# Patient Record
Sex: Female | Born: 1950 | Race: White | Hispanic: No | Marital: Married | Smoking: Never smoker
Health system: Southern US, Community
[De-identification: ages and names within clinical notes are randomized; demographics above are authoritative.]

## PROBLEM LIST (undated history)

## (undated) DIAGNOSIS — I2510 Atherosclerotic heart disease of native coronary artery without angina pectoris: Secondary | ICD-10-CM

## (undated) DIAGNOSIS — C649 Malignant neoplasm of unspecified kidney, except renal pelvis: Secondary | ICD-10-CM

## (undated) DIAGNOSIS — R0600 Dyspnea, unspecified: Secondary | ICD-10-CM

## (undated) DIAGNOSIS — R0902 Hypoxemia: Secondary | ICD-10-CM

## (undated) DIAGNOSIS — E785 Hyperlipidemia, unspecified: Secondary | ICD-10-CM

## (undated) DIAGNOSIS — C8190 Hodgkin lymphoma, unspecified, unspecified site: Secondary | ICD-10-CM

## (undated) DIAGNOSIS — M1990 Unspecified osteoarthritis, unspecified site: Secondary | ICD-10-CM

## (undated) DIAGNOSIS — I4891 Unspecified atrial fibrillation: Secondary | ICD-10-CM

## (undated) DIAGNOSIS — N182 Chronic kidney disease, stage 2 (mild): Secondary | ICD-10-CM

## (undated) DIAGNOSIS — I350 Nonrheumatic aortic (valve) stenosis: Secondary | ICD-10-CM

## (undated) DIAGNOSIS — N289 Disorder of kidney and ureter, unspecified: Secondary | ICD-10-CM

## (undated) DIAGNOSIS — Z950 Presence of cardiac pacemaker: Secondary | ICD-10-CM

## (undated) DIAGNOSIS — R55 Syncope and collapse: Secondary | ICD-10-CM

## (undated) DIAGNOSIS — F419 Anxiety disorder, unspecified: Secondary | ICD-10-CM

## (undated) DIAGNOSIS — I10 Essential (primary) hypertension: Secondary | ICD-10-CM

## (undated) DIAGNOSIS — N184 Chronic kidney disease, stage 4 (severe): Secondary | ICD-10-CM

## (undated) DIAGNOSIS — C50919 Malignant neoplasm of unspecified site of unspecified female breast: Secondary | ICD-10-CM

## (undated) DIAGNOSIS — D509 Iron deficiency anemia, unspecified: Secondary | ICD-10-CM

## (undated) DIAGNOSIS — E1143 Type 2 diabetes mellitus with diabetic autonomic (poly)neuropathy: Secondary | ICD-10-CM

## (undated) DIAGNOSIS — Z9889 Other specified postprocedural states: Secondary | ICD-10-CM

## (undated) DIAGNOSIS — K559 Vascular disorder of intestine, unspecified: Secondary | ICD-10-CM

## (undated) DIAGNOSIS — Z9989 Dependence on other enabling machines and devices: Secondary | ICD-10-CM

## (undated) DIAGNOSIS — R112 Nausea with vomiting, unspecified: Secondary | ICD-10-CM

## (undated) DIAGNOSIS — I5032 Chronic diastolic (congestive) heart failure: Secondary | ICD-10-CM

## (undated) DIAGNOSIS — D131 Benign neoplasm of stomach: Secondary | ICD-10-CM

## (undated) DIAGNOSIS — M810 Age-related osteoporosis without current pathological fracture: Secondary | ICD-10-CM

## (undated) DIAGNOSIS — G4733 Obstructive sleep apnea (adult) (pediatric): Secondary | ICD-10-CM

## (undated) DIAGNOSIS — K3184 Gastroparesis: Principal | ICD-10-CM

## (undated) DIAGNOSIS — G43909 Migraine, unspecified, not intractable, without status migrainosus: Secondary | ICD-10-CM

## (undated) DIAGNOSIS — K56609 Unspecified intestinal obstruction, unspecified as to partial versus complete obstruction: Secondary | ICD-10-CM

## (undated) DIAGNOSIS — I519 Heart disease, unspecified: Secondary | ICD-10-CM

## (undated) DIAGNOSIS — Z1379 Encounter for other screening for genetic and chromosomal anomalies: Secondary | ICD-10-CM

## (undated) DIAGNOSIS — K7581 Nonalcoholic steatohepatitis (NASH): Secondary | ICD-10-CM

## (undated) DIAGNOSIS — M8580 Other specified disorders of bone density and structure, unspecified site: Secondary | ICD-10-CM

## (undated) DIAGNOSIS — E109 Type 1 diabetes mellitus without complications: Secondary | ICD-10-CM

## (undated) DIAGNOSIS — I480 Paroxysmal atrial fibrillation: Secondary | ICD-10-CM

## (undated) DIAGNOSIS — T8859XA Other complications of anesthesia, initial encounter: Secondary | ICD-10-CM

## (undated) HISTORY — DX: Encounter for other screening for genetic and chromosomal anomalies: Z13.79

## (undated) HISTORY — DX: Type 2 diabetes mellitus with diabetic autonomic (poly)neuropathy: E11.43

## (undated) HISTORY — PX: CORONARY ARTERY BYPASS GRAFT: SHX141

## (undated) HISTORY — PX: ESOPHAGOGASTRODUODENOSCOPY: SHX1529

## (undated) HISTORY — DX: Type 1 diabetes mellitus without complications: E10.9

## (undated) HISTORY — PX: COLONOSCOPY: SHX174

## (undated) HISTORY — DX: Unspecified atrial fibrillation: I48.91

## (undated) HISTORY — DX: Hypoxemia: R09.02

## (undated) HISTORY — DX: Nonrheumatic aortic (valve) stenosis: I35.0

## (undated) HISTORY — DX: Heart disease, unspecified: I51.9

## (undated) HISTORY — DX: Malignant neoplasm of unspecified kidney, except renal pelvis: C64.9

## (undated) HISTORY — DX: Gastroparesis: K31.84

## (undated) HISTORY — DX: Migraine, unspecified, not intractable, without status migrainosus: G43.909

## (undated) HISTORY — DX: Nonalcoholic steatohepatitis (NASH): K75.81

## (undated) HISTORY — DX: Dependence on other enabling machines and devices: Z99.89

## (undated) HISTORY — DX: Obstructive sleep apnea (adult) (pediatric): G47.33

## (undated) HISTORY — DX: Unspecified intestinal obstruction, unspecified as to partial versus complete obstruction: K56.609

## (undated) HISTORY — DX: Malignant neoplasm of unspecified site of unspecified female breast: C50.919

## (undated) HISTORY — DX: Vascular disorder of intestine, unspecified: K55.9

## (undated) HISTORY — DX: Age-related osteoporosis without current pathological fracture: M81.0

## (undated) HISTORY — PX: EXPLORATORY LAPAROTOMY W/ BOWEL RESECTION: SHX1544

## (undated) HISTORY — DX: Chronic kidney disease, stage 4 (severe): N18.4

## (undated) HISTORY — PX: LIVER BIOPSY: SHX301

## (undated) HISTORY — PX: CARDIAC CATHETERIZATION: SHX172

## (undated) HISTORY — DX: Benign neoplasm of stomach: D13.1

## (undated) HISTORY — PX: BONE MARROW BIOPSY: SHX199

## (undated) HISTORY — DX: Hodgkin lymphoma, unspecified, unspecified site: C81.90

## (undated) HISTORY — PX: BREAST EXCISIONAL BIOPSY: SUR124

## (undated) HISTORY — DX: Hyperlipidemia, unspecified: E78.5

## (undated) HISTORY — PX: CERVICAL BIOPSY: SHX590

## (undated) HISTORY — DX: Other specified disorders of bone density and structure, unspecified site: M85.80

## (undated) HISTORY — DX: Atherosclerotic heart disease of native coronary artery without angina pectoris: I25.10

## (undated) HISTORY — DX: Essential (primary) hypertension: I10

## (undated) HISTORY — DX: Unspecified osteoarthritis, unspecified site: M19.90

---

## 1979-07-24 HISTORY — PX: TUMOR EXCISION: SHX421

## 1989-07-23 DIAGNOSIS — C8190 Hodgkin lymphoma, unspecified, unspecified site: Secondary | ICD-10-CM

## 1989-07-23 HISTORY — DX: Hodgkin lymphoma, unspecified, unspecified site: C81.90

## 1996-07-23 DIAGNOSIS — H409 Unspecified glaucoma: Secondary | ICD-10-CM

## 1996-07-23 HISTORY — DX: Unspecified glaucoma: H40.9

## 1997-07-23 DIAGNOSIS — K7581 Nonalcoholic steatohepatitis (NASH): Secondary | ICD-10-CM

## 1997-07-23 HISTORY — DX: Nonalcoholic steatohepatitis (NASH): K75.81

## 1998-01-31 ENCOUNTER — Ambulatory Visit (HOSPITAL_COMMUNITY): Admission: RE | Admit: 1998-01-31 | Discharge: 1998-01-31 | Payer: Self-pay | Admitting: Gastroenterology

## 1998-07-23 HISTORY — PX: TOTAL ABDOMINAL HYSTERECTOMY W/ BILATERAL SALPINGOOPHORECTOMY: SHX83

## 1998-07-23 HISTORY — PX: ABDOMINAL HYSTERECTOMY: SHX81

## 1998-08-16 ENCOUNTER — Encounter: Payer: Self-pay | Admitting: Obstetrics and Gynecology

## 1998-08-16 ENCOUNTER — Ambulatory Visit (HOSPITAL_COMMUNITY): Admission: RE | Admit: 1998-08-16 | Discharge: 1998-08-16 | Payer: Self-pay | Admitting: Obstetrics and Gynecology

## 1998-09-06 ENCOUNTER — Inpatient Hospital Stay (HOSPITAL_COMMUNITY): Admission: RE | Admit: 1998-09-06 | Discharge: 1998-09-09 | Payer: Self-pay | Admitting: Obstetrics and Gynecology

## 1998-12-22 ENCOUNTER — Encounter: Payer: Self-pay | Admitting: Emergency Medicine

## 1998-12-23 ENCOUNTER — Inpatient Hospital Stay (HOSPITAL_COMMUNITY): Admission: EM | Admit: 1998-12-23 | Discharge: 1998-12-24 | Payer: Self-pay | Admitting: Emergency Medicine

## 1998-12-23 ENCOUNTER — Encounter: Payer: Self-pay | Admitting: Internal Medicine

## 1998-12-31 ENCOUNTER — Inpatient Hospital Stay (HOSPITAL_COMMUNITY): Admission: AD | Admit: 1998-12-31 | Discharge: 1999-01-04 | Payer: Self-pay | Admitting: Cardiology

## 1999-02-03 ENCOUNTER — Observation Stay (HOSPITAL_COMMUNITY): Admission: AD | Admit: 1999-02-03 | Discharge: 1999-02-04 | Payer: Self-pay | Admitting: Cardiology

## 1999-03-24 ENCOUNTER — Observation Stay (HOSPITAL_COMMUNITY): Admission: AD | Admit: 1999-03-24 | Discharge: 1999-03-25 | Payer: Self-pay | Admitting: *Deleted

## 1999-04-18 ENCOUNTER — Encounter (HOSPITAL_COMMUNITY): Admission: RE | Admit: 1999-04-18 | Discharge: 1999-07-17 | Payer: Self-pay | Admitting: Cardiology

## 1999-07-05 ENCOUNTER — Inpatient Hospital Stay (HOSPITAL_COMMUNITY): Admission: AD | Admit: 1999-07-05 | Discharge: 1999-07-12 | Payer: Self-pay | Admitting: *Deleted

## 1999-07-06 ENCOUNTER — Encounter: Payer: Self-pay | Admitting: Surgery

## 1999-07-07 ENCOUNTER — Encounter: Payer: Self-pay | Admitting: Surgery

## 1999-07-08 ENCOUNTER — Encounter: Payer: Self-pay | Admitting: Surgery

## 1999-07-09 ENCOUNTER — Encounter: Payer: Self-pay | Admitting: Surgery

## 1999-08-01 ENCOUNTER — Encounter (HOSPITAL_COMMUNITY): Admission: RE | Admit: 1999-08-01 | Discharge: 1999-10-30 | Payer: Self-pay | Admitting: Cardiology

## 1999-10-18 ENCOUNTER — Other Ambulatory Visit: Admission: RE | Admit: 1999-10-18 | Discharge: 1999-10-18 | Payer: Self-pay | Admitting: Obstetrics and Gynecology

## 2000-10-22 ENCOUNTER — Other Ambulatory Visit: Admission: RE | Admit: 2000-10-22 | Discharge: 2000-10-22 | Payer: Self-pay | Admitting: Obstetrics and Gynecology

## 2000-11-08 ENCOUNTER — Encounter: Payer: Self-pay | Admitting: Oncology

## 2000-11-08 ENCOUNTER — Encounter: Admission: RE | Admit: 2000-11-08 | Discharge: 2000-11-08 | Payer: Self-pay | Admitting: Hematology and Oncology

## 2000-11-25 ENCOUNTER — Ambulatory Visit (HOSPITAL_COMMUNITY): Admission: RE | Admit: 2000-11-25 | Discharge: 2000-11-25 | Payer: Self-pay | Admitting: Oncology

## 2000-11-25 ENCOUNTER — Encounter: Payer: Self-pay | Admitting: Oncology

## 2001-04-28 ENCOUNTER — Encounter (HOSPITAL_COMMUNITY): Admission: RE | Admit: 2001-04-28 | Discharge: 2001-07-27 | Payer: Self-pay | Admitting: Cardiology

## 2001-05-15 ENCOUNTER — Encounter: Payer: Self-pay | Admitting: Oncology

## 2001-05-15 ENCOUNTER — Encounter: Admission: RE | Admit: 2001-05-15 | Discharge: 2001-05-15 | Payer: Self-pay | Admitting: Oncology

## 2001-08-23 ENCOUNTER — Encounter (HOSPITAL_COMMUNITY): Admission: RE | Admit: 2001-08-23 | Discharge: 2001-11-21 | Payer: Self-pay | Admitting: Cardiology

## 2001-11-04 ENCOUNTER — Other Ambulatory Visit: Admission: RE | Admit: 2001-11-04 | Discharge: 2001-11-04 | Payer: Self-pay | Admitting: Obstetrics and Gynecology

## 2002-01-13 ENCOUNTER — Encounter: Payer: Self-pay | Admitting: Family Medicine

## 2002-01-13 ENCOUNTER — Ambulatory Visit (HOSPITAL_COMMUNITY): Admission: RE | Admit: 2002-01-13 | Discharge: 2002-01-13 | Payer: Self-pay | Admitting: Family Medicine

## 2002-01-21 ENCOUNTER — Encounter: Admission: RE | Admit: 2002-01-21 | Discharge: 2002-01-21 | Payer: Self-pay | Admitting: Oncology

## 2002-01-21 ENCOUNTER — Encounter: Payer: Self-pay | Admitting: Oncology

## 2002-04-28 ENCOUNTER — Ambulatory Visit (HOSPITAL_COMMUNITY): Admission: RE | Admit: 2002-04-28 | Discharge: 2002-04-28 | Payer: Self-pay | Admitting: Family Medicine

## 2002-04-28 ENCOUNTER — Encounter: Payer: Self-pay | Admitting: Family Medicine

## 2002-08-11 ENCOUNTER — Ambulatory Visit (HOSPITAL_COMMUNITY): Admission: RE | Admit: 2002-08-11 | Discharge: 2002-08-11 | Payer: Self-pay | Admitting: Family Medicine

## 2002-08-11 ENCOUNTER — Encounter: Payer: Self-pay | Admitting: Family Medicine

## 2002-08-14 ENCOUNTER — Ambulatory Visit (HOSPITAL_COMMUNITY): Admission: RE | Admit: 2002-08-14 | Discharge: 2002-08-14 | Payer: Self-pay | Admitting: Family Medicine

## 2002-09-03 ENCOUNTER — Ambulatory Visit (HOSPITAL_COMMUNITY): Admission: RE | Admit: 2002-09-03 | Discharge: 2002-09-03 | Payer: Self-pay | Admitting: Oncology

## 2002-09-03 ENCOUNTER — Encounter: Payer: Self-pay | Admitting: Oncology

## 2002-09-14 ENCOUNTER — Encounter: Payer: Self-pay | Admitting: Internal Medicine

## 2002-09-14 ENCOUNTER — Ambulatory Visit (HOSPITAL_COMMUNITY): Admission: RE | Admit: 2002-09-14 | Discharge: 2002-09-14 | Payer: Self-pay | Admitting: Internal Medicine

## 2002-10-07 ENCOUNTER — Inpatient Hospital Stay (HOSPITAL_COMMUNITY): Admission: RE | Admit: 2002-10-07 | Discharge: 2002-10-12 | Payer: Self-pay | Admitting: Surgery

## 2002-10-07 ENCOUNTER — Encounter (INDEPENDENT_AMBULATORY_CARE_PROVIDER_SITE_OTHER): Payer: Self-pay | Admitting: Specialist

## 2003-02-04 ENCOUNTER — Encounter: Payer: Self-pay | Admitting: Oncology

## 2003-02-04 ENCOUNTER — Encounter: Admission: RE | Admit: 2003-02-04 | Discharge: 2003-02-04 | Payer: Self-pay | Admitting: Oncology

## 2003-04-27 ENCOUNTER — Ambulatory Visit (HOSPITAL_COMMUNITY): Admission: RE | Admit: 2003-04-27 | Discharge: 2003-04-27 | Payer: Self-pay | Admitting: Family Medicine

## 2003-04-27 ENCOUNTER — Encounter: Payer: Self-pay | Admitting: Family Medicine

## 2003-05-05 ENCOUNTER — Ambulatory Visit (HOSPITAL_BASED_OUTPATIENT_CLINIC_OR_DEPARTMENT_OTHER): Admission: RE | Admit: 2003-05-05 | Discharge: 2003-05-05 | Payer: Self-pay | Admitting: Surgery

## 2003-08-12 ENCOUNTER — Ambulatory Visit (HOSPITAL_COMMUNITY): Admission: RE | Admit: 2003-08-12 | Discharge: 2003-08-12 | Payer: Self-pay | Admitting: Cardiology

## 2004-01-17 ENCOUNTER — Inpatient Hospital Stay (HOSPITAL_COMMUNITY): Admission: EM | Admit: 2004-01-17 | Discharge: 2004-01-19 | Payer: Self-pay | Admitting: Emergency Medicine

## 2004-01-31 ENCOUNTER — Ambulatory Visit (HOSPITAL_COMMUNITY): Admission: RE | Admit: 2004-01-31 | Discharge: 2004-01-31 | Payer: Self-pay | Admitting: Cardiology

## 2004-02-17 ENCOUNTER — Encounter: Admission: RE | Admit: 2004-02-17 | Discharge: 2004-02-17 | Payer: Self-pay | Admitting: Oncology

## 2004-06-12 ENCOUNTER — Ambulatory Visit: Payer: Self-pay | Admitting: Internal Medicine

## 2004-07-04 ENCOUNTER — Ambulatory Visit: Payer: Self-pay | Admitting: Internal Medicine

## 2004-07-13 ENCOUNTER — Ambulatory Visit: Payer: Self-pay | Admitting: Internal Medicine

## 2004-08-03 ENCOUNTER — Ambulatory Visit: Payer: Self-pay | Admitting: Cardiology

## 2004-08-16 ENCOUNTER — Ambulatory Visit: Payer: Self-pay | Admitting: Cardiology

## 2004-10-26 ENCOUNTER — Ambulatory Visit: Payer: Self-pay | Admitting: Cardiology

## 2004-12-12 ENCOUNTER — Ambulatory Visit: Payer: Self-pay | Admitting: Oncology

## 2005-02-02 ENCOUNTER — Ambulatory Visit: Payer: Self-pay | Admitting: Cardiology

## 2005-02-19 ENCOUNTER — Encounter: Admission: RE | Admit: 2005-02-19 | Discharge: 2005-02-19 | Payer: Self-pay | Admitting: Oncology

## 2005-09-24 ENCOUNTER — Ambulatory Visit: Payer: Self-pay | Admitting: Cardiology

## 2005-09-28 ENCOUNTER — Ambulatory Visit (HOSPITAL_COMMUNITY): Admission: RE | Admit: 2005-09-28 | Discharge: 2005-09-28 | Payer: Self-pay | Admitting: Cardiology

## 2005-12-10 ENCOUNTER — Ambulatory Visit: Payer: Self-pay | Admitting: Oncology

## 2005-12-12 LAB — CBC WITH DIFFERENTIAL/PLATELET
Basophils Absolute: 0.1 10*3/uL (ref 0.0–0.1)
HCT: 39.8 % (ref 34.8–46.6)
HGB: 13.3 g/dL (ref 11.6–15.9)
MONO#: 0.5 10*3/uL (ref 0.1–0.9)
NEUT#: 7.4 10*3/uL — ABNORMAL HIGH (ref 1.5–6.5)
NEUT%: 75 % (ref 39.6–76.8)
WBC: 9.9 10*3/uL (ref 3.9–10.0)
lymph#: 1.8 10*3/uL (ref 0.9–3.3)

## 2005-12-12 LAB — T3 UPTAKE: T3 Uptake: 31.1 % (ref 22.5–37.0)

## 2005-12-12 LAB — COMPREHENSIVE METABOLIC PANEL
Albumin: 4.6 g/dL (ref 3.5–5.2)
BUN: 21 mg/dL (ref 6–23)
CO2: 28 mEq/L (ref 19–32)
Calcium: 9.9 mg/dL (ref 8.4–10.5)
Glucose, Bld: 221 mg/dL — ABNORMAL HIGH (ref 70–99)
Potassium: 4 mEq/L (ref 3.5–5.3)
Sodium: 142 mEq/L (ref 135–145)
Total Protein: 7.5 g/dL (ref 6.0–8.3)

## 2005-12-12 LAB — TSH: TSH: 1.975 u[IU]/mL (ref 0.350–5.500)

## 2005-12-12 LAB — T4: T4, Total: 7.6 ug/dL (ref 5.0–12.5)

## 2006-02-05 ENCOUNTER — Ambulatory Visit: Payer: Self-pay | Admitting: Cardiovascular Disease

## 2006-02-05 ENCOUNTER — Inpatient Hospital Stay (HOSPITAL_COMMUNITY): Admission: EM | Admit: 2006-02-05 | Discharge: 2006-02-06 | Payer: Self-pay | Admitting: Emergency Medicine

## 2006-02-05 ENCOUNTER — Encounter: Payer: Self-pay | Admitting: Cardiovascular Disease

## 2006-02-26 ENCOUNTER — Encounter: Admission: RE | Admit: 2006-02-26 | Discharge: 2006-02-26 | Payer: Self-pay | Admitting: Oncology

## 2006-03-11 ENCOUNTER — Ambulatory Visit: Payer: Self-pay | Admitting: Cardiology

## 2006-07-23 DIAGNOSIS — K559 Vascular disorder of intestine, unspecified: Secondary | ICD-10-CM

## 2006-07-23 HISTORY — PX: NEPHRECTOMY: SHX65

## 2006-07-23 HISTORY — DX: Vascular disorder of intestine, unspecified: K55.9

## 2006-10-07 ENCOUNTER — Ambulatory Visit: Payer: Self-pay | Admitting: Cardiology

## 2007-01-08 ENCOUNTER — Ambulatory Visit: Payer: Self-pay | Admitting: Oncology

## 2007-01-13 LAB — URINALYSIS, MICROSCOPIC - CHCC
Blood: NEGATIVE
Ketones: NEGATIVE mg/dL
Nitrite: NEGATIVE
Specific Gravity, Urine: 1.01 (ref 1.003–1.035)

## 2007-01-13 LAB — COMPREHENSIVE METABOLIC PANEL
Alkaline Phosphatase: 69 U/L (ref 39–117)
Creatinine, Ser: 0.73 mg/dL (ref 0.40–1.20)
Glucose, Bld: 187 mg/dL — ABNORMAL HIGH (ref 70–99)
Sodium: 141 mEq/L (ref 135–145)
Total Bilirubin: 0.3 mg/dL (ref 0.3–1.2)
Total Protein: 8 g/dL (ref 6.0–8.3)

## 2007-01-13 LAB — CBC WITH DIFFERENTIAL/PLATELET
BASO%: 0.4 % (ref 0.0–2.0)
Basophils Absolute: 0 10*3/uL (ref 0.0–0.1)
EOS%: 1.4 % (ref 0.0–7.0)
MCH: 30 pg (ref 26.0–34.0)
MCHC: 34.5 g/dL (ref 32.0–36.0)
MCV: 87 fL (ref 81.0–101.0)
MONO%: 5.5 % (ref 0.0–13.0)
RBC: 4.32 10*6/uL (ref 3.70–5.32)
RDW: 13.7 % (ref 11.3–14.5)

## 2007-01-15 LAB — URINE CULTURE

## 2007-02-10 ENCOUNTER — Ambulatory Visit: Payer: Self-pay | Admitting: Cardiology

## 2007-02-20 ENCOUNTER — Ambulatory Visit: Payer: Self-pay | Admitting: Cardiology

## 2007-03-04 ENCOUNTER — Encounter: Admission: RE | Admit: 2007-03-04 | Discharge: 2007-03-04 | Payer: Self-pay | Admitting: Oncology

## 2007-04-30 ENCOUNTER — Ambulatory Visit (HOSPITAL_COMMUNITY): Admission: RE | Admit: 2007-04-30 | Discharge: 2007-04-30 | Payer: Self-pay | Admitting: Family Medicine

## 2007-05-08 ENCOUNTER — Encounter (HOSPITAL_COMMUNITY): Admission: RE | Admit: 2007-05-08 | Discharge: 2007-06-07 | Payer: Self-pay | Admitting: Family Medicine

## 2007-05-12 ENCOUNTER — Ambulatory Visit: Payer: Self-pay | Admitting: Cardiology

## 2007-05-13 ENCOUNTER — Ambulatory Visit: Payer: Self-pay | Admitting: Cardiology

## 2007-05-13 ENCOUNTER — Ambulatory Visit (HOSPITAL_COMMUNITY): Admission: RE | Admit: 2007-05-13 | Discharge: 2007-05-13 | Payer: Self-pay | Admitting: Cardiology

## 2007-05-14 ENCOUNTER — Ambulatory Visit (HOSPITAL_COMMUNITY): Admission: RE | Admit: 2007-05-14 | Discharge: 2007-05-14 | Payer: Self-pay | Admitting: Family Medicine

## 2007-05-20 ENCOUNTER — Ambulatory Visit: Payer: Self-pay | Admitting: Cardiology

## 2007-05-20 ENCOUNTER — Ambulatory Visit: Payer: Self-pay | Admitting: Internal Medicine

## 2007-05-20 ENCOUNTER — Inpatient Hospital Stay (HOSPITAL_COMMUNITY): Admission: EM | Admit: 2007-05-20 | Discharge: 2007-05-23 | Payer: Self-pay | Admitting: Emergency Medicine

## 2007-05-21 ENCOUNTER — Encounter: Payer: Self-pay | Admitting: Internal Medicine

## 2007-05-24 DIAGNOSIS — C649 Malignant neoplasm of unspecified kidney, except renal pelvis: Secondary | ICD-10-CM

## 2007-05-24 HISTORY — DX: Malignant neoplasm of unspecified kidney, except renal pelvis: C64.9

## 2007-05-27 ENCOUNTER — Ambulatory Visit: Payer: Self-pay | Admitting: Internal Medicine

## 2007-06-05 ENCOUNTER — Encounter: Payer: Self-pay | Admitting: Internal Medicine

## 2007-06-05 ENCOUNTER — Ambulatory Visit: Payer: Self-pay | Admitting: Internal Medicine

## 2007-06-11 ENCOUNTER — Encounter (INDEPENDENT_AMBULATORY_CARE_PROVIDER_SITE_OTHER): Payer: Self-pay | Admitting: Urology

## 2007-06-11 ENCOUNTER — Inpatient Hospital Stay (HOSPITAL_COMMUNITY): Admission: RE | Admit: 2007-06-11 | Discharge: 2007-06-14 | Payer: Self-pay | Admitting: Urology

## 2007-06-24 ENCOUNTER — Encounter (INDEPENDENT_AMBULATORY_CARE_PROVIDER_SITE_OTHER): Payer: Self-pay | Admitting: *Deleted

## 2007-07-14 ENCOUNTER — Encounter (INDEPENDENT_AMBULATORY_CARE_PROVIDER_SITE_OTHER): Payer: Self-pay | Admitting: *Deleted

## 2007-07-14 ENCOUNTER — Encounter (HOSPITAL_COMMUNITY): Admission: RE | Admit: 2007-07-14 | Discharge: 2007-07-23 | Payer: Self-pay | Admitting: Internal Medicine

## 2007-07-15 ENCOUNTER — Ambulatory Visit: Payer: Self-pay | Admitting: Internal Medicine

## 2007-07-16 ENCOUNTER — Encounter: Payer: Self-pay | Admitting: Internal Medicine

## 2007-07-16 ENCOUNTER — Ambulatory Visit: Payer: Self-pay | Admitting: Internal Medicine

## 2007-08-14 ENCOUNTER — Ambulatory Visit: Payer: Self-pay | Admitting: Internal Medicine

## 2007-08-14 LAB — CONVERTED CEMR LAB
Bilirubin Urine: NEGATIVE
Hemoglobin, Urine: NEGATIVE
Leukocytes, UA: NEGATIVE
Urine Glucose: >=1000 mg/dL — CR

## 2007-08-19 ENCOUNTER — Encounter: Admission: RE | Admit: 2007-08-19 | Discharge: 2007-08-19 | Payer: Self-pay | Admitting: Internal Medicine

## 2007-10-15 ENCOUNTER — Ambulatory Visit: Payer: Self-pay | Admitting: Internal Medicine

## 2007-11-25 ENCOUNTER — Ambulatory Visit (HOSPITAL_COMMUNITY): Admission: RE | Admit: 2007-11-25 | Discharge: 2007-11-25 | Payer: Self-pay | Admitting: Urology

## 2007-12-05 ENCOUNTER — Encounter: Payer: Self-pay | Admitting: Internal Medicine

## 2007-12-17 ENCOUNTER — Encounter: Payer: Self-pay | Admitting: Internal Medicine

## 2008-01-08 ENCOUNTER — Ambulatory Visit: Payer: Self-pay | Admitting: Oncology

## 2008-01-12 ENCOUNTER — Encounter: Payer: Self-pay | Admitting: Internal Medicine

## 2008-01-12 LAB — TSH: TSH: 2.26 u[IU]/mL (ref 0.350–5.500)

## 2008-01-12 LAB — CBC WITH DIFFERENTIAL/PLATELET
BASO%: 0.4 % (ref 0.0–2.0)
Basophils Absolute: 0 10*3/uL (ref 0.0–0.1)
Eosinophils Absolute: 0.1 10*3/uL (ref 0.0–0.5)
HCT: 35.2 % (ref 34.8–46.6)
LYMPH%: 13 % — ABNORMAL LOW (ref 14.0–48.0)
MCHC: 33.8 g/dL (ref 32.0–36.0)
MONO#: 0.6 10*3/uL (ref 0.1–0.9)
NEUT%: 80.6 % — ABNORMAL HIGH (ref 39.6–76.8)
Platelets: 196 10*3/uL (ref 145–400)
WBC: 11.5 10*3/uL — ABNORMAL HIGH (ref 3.9–10.0)

## 2008-01-12 LAB — COMPREHENSIVE METABOLIC PANEL
BUN: 28 mg/dL — ABNORMAL HIGH (ref 6–23)
CO2: 21 mEq/L (ref 19–32)
Creatinine, Ser: 1.03 mg/dL (ref 0.40–1.20)
Glucose, Bld: 170 mg/dL — ABNORMAL HIGH (ref 70–99)
Total Bilirubin: 0.4 mg/dL (ref 0.3–1.2)

## 2008-02-06 ENCOUNTER — Encounter: Payer: Self-pay | Admitting: Internal Medicine

## 2008-02-27 ENCOUNTER — Encounter: Payer: Self-pay | Admitting: Internal Medicine

## 2008-03-04 ENCOUNTER — Encounter: Admission: RE | Admit: 2008-03-04 | Discharge: 2008-03-04 | Payer: Self-pay | Admitting: Oncology

## 2008-03-05 ENCOUNTER — Encounter: Payer: Self-pay | Admitting: Internal Medicine

## 2008-03-24 ENCOUNTER — Encounter: Payer: Self-pay | Admitting: Internal Medicine

## 2008-05-20 ENCOUNTER — Ambulatory Visit: Payer: Self-pay | Admitting: Internal Medicine

## 2008-05-25 ENCOUNTER — Ambulatory Visit: Payer: Self-pay | Admitting: Cardiology

## 2008-06-09 ENCOUNTER — Ambulatory Visit (HOSPITAL_COMMUNITY): Admission: RE | Admit: 2008-06-09 | Discharge: 2008-06-09 | Payer: Self-pay | Admitting: Urology

## 2008-06-16 ENCOUNTER — Encounter: Payer: Self-pay | Admitting: Internal Medicine

## 2008-06-29 ENCOUNTER — Ambulatory Visit (HOSPITAL_COMMUNITY): Admission: RE | Admit: 2008-06-29 | Discharge: 2008-06-29 | Payer: Self-pay | Admitting: Family Medicine

## 2008-06-30 ENCOUNTER — Telehealth: Payer: Self-pay | Admitting: Internal Medicine

## 2008-07-06 ENCOUNTER — Ambulatory Visit: Payer: Self-pay | Admitting: Cardiology

## 2008-07-12 ENCOUNTER — Encounter: Admission: RE | Admit: 2008-07-12 | Discharge: 2008-07-12 | Payer: Self-pay | Admitting: Surgery

## 2008-08-27 ENCOUNTER — Encounter: Payer: Self-pay | Admitting: Internal Medicine

## 2008-09-10 ENCOUNTER — Encounter: Payer: Self-pay | Admitting: Internal Medicine

## 2008-11-02 ENCOUNTER — Ambulatory Visit: Payer: Self-pay | Admitting: Cardiology

## 2008-11-02 ENCOUNTER — Encounter: Payer: Self-pay | Admitting: Physician Assistant

## 2008-11-02 ENCOUNTER — Encounter: Payer: Self-pay | Admitting: Cardiology

## 2008-11-04 ENCOUNTER — Ambulatory Visit: Payer: Self-pay | Admitting: Cardiology

## 2008-11-04 ENCOUNTER — Encounter: Payer: Self-pay | Admitting: Cardiology

## 2008-11-04 ENCOUNTER — Ambulatory Visit (HOSPITAL_COMMUNITY): Admission: RE | Admit: 2008-11-04 | Discharge: 2008-11-04 | Payer: Self-pay | Admitting: Cardiology

## 2008-12-02 ENCOUNTER — Ambulatory Visit: Payer: Self-pay | Admitting: Cardiology

## 2008-12-08 ENCOUNTER — Ambulatory Visit (HOSPITAL_COMMUNITY): Admission: RE | Admit: 2008-12-08 | Discharge: 2008-12-08 | Payer: Self-pay | Admitting: Urology

## 2008-12-23 ENCOUNTER — Encounter: Payer: Self-pay | Admitting: Internal Medicine

## 2008-12-24 ENCOUNTER — Encounter: Payer: Self-pay | Admitting: Internal Medicine

## 2009-01-12 ENCOUNTER — Ambulatory Visit: Payer: Self-pay | Admitting: Oncology

## 2009-01-14 ENCOUNTER — Encounter: Payer: Self-pay | Admitting: Internal Medicine

## 2009-01-14 LAB — CBC WITH DIFFERENTIAL/PLATELET
BASO%: 0.5 % (ref 0.0–2.0)
Eosinophils Absolute: 0.2 10*3/uL (ref 0.0–0.5)
LYMPH%: 17 % (ref 14.0–49.7)
MCHC: 33.9 g/dL (ref 31.5–36.0)
MONO#: 0.5 10*3/uL (ref 0.1–0.9)
NEUT#: 9 10*3/uL — ABNORMAL HIGH (ref 1.5–6.5)
Platelets: 206 10*3/uL (ref 145–400)
RBC: 4.07 10*6/uL (ref 3.70–5.45)
WBC: 11.7 10*3/uL — ABNORMAL HIGH (ref 3.9–10.3)
lymph#: 2 10*3/uL (ref 0.9–3.3)

## 2009-01-14 LAB — COMPREHENSIVE METABOLIC PANEL
Alkaline Phosphatase: 49 U/L (ref 39–117)
Glucose, Bld: 120 mg/dL — ABNORMAL HIGH (ref 70–99)
Sodium: 140 mEq/L (ref 135–145)
Total Bilirubin: 0.3 mg/dL (ref 0.3–1.2)
Total Protein: 8.1 g/dL (ref 6.0–8.3)

## 2009-01-25 ENCOUNTER — Encounter: Payer: Self-pay | Admitting: Cardiology

## 2009-02-03 ENCOUNTER — Encounter (INDEPENDENT_AMBULATORY_CARE_PROVIDER_SITE_OTHER): Payer: Self-pay | Admitting: *Deleted

## 2009-02-23 ENCOUNTER — Ambulatory Visit: Payer: Self-pay | Admitting: Cardiology

## 2009-02-23 ENCOUNTER — Encounter: Payer: Self-pay | Admitting: Cardiology

## 2009-03-07 ENCOUNTER — Encounter: Admission: RE | Admit: 2009-03-07 | Discharge: 2009-03-07 | Payer: Self-pay | Admitting: Oncology

## 2009-03-07 ENCOUNTER — Encounter: Payer: Self-pay | Admitting: Internal Medicine

## 2009-03-07 ENCOUNTER — Encounter: Payer: Self-pay | Admitting: Cardiology

## 2009-03-07 ENCOUNTER — Encounter (INDEPENDENT_AMBULATORY_CARE_PROVIDER_SITE_OTHER): Payer: Self-pay | Admitting: *Deleted

## 2009-03-07 LAB — CONVERTED CEMR LAB
AST: 23 units/L
Albumin: 4.7 g/dL
Alkaline Phosphatase: 71 units/L
BUN: 29 mg/dL
Bacteria, UA: NEGATIVE
CO2: 26 meq/L
Calcium: 10.8 mg/dL
Cholesterol: 161 mg/dL
Glucose, Bld: 131 mg/dL
HCT: 35.4 %
Hemoglobin, Urine: NEGATIVE
Hemoglobin: 11.4 g/dL
MCV: 87.3 fL
Nitrite: NEGATIVE
Sodium: 140 meq/L
Triglycerides: 90 mg/dL
Urine Glucose: 50 mg/dL
WBC number, urine, microscopy: NEGATIVE /hpf
pH: 6.5

## 2009-03-08 ENCOUNTER — Encounter (INDEPENDENT_AMBULATORY_CARE_PROVIDER_SITE_OTHER): Payer: Self-pay | Admitting: *Deleted

## 2009-04-04 ENCOUNTER — Encounter (INDEPENDENT_AMBULATORY_CARE_PROVIDER_SITE_OTHER): Payer: Self-pay | Admitting: *Deleted

## 2009-04-29 ENCOUNTER — Encounter (INDEPENDENT_AMBULATORY_CARE_PROVIDER_SITE_OTHER): Payer: Self-pay | Admitting: *Deleted

## 2009-04-29 ENCOUNTER — Ambulatory Visit: Payer: Self-pay | Admitting: Cardiovascular Disease

## 2009-04-29 LAB — CONVERTED CEMR LAB
BUN: 33 mg/dL
CO2: 27 meq/L
Calcium: 9.8 mg/dL
Chloride: 104 meq/L
Creatinine, Ser: 1.13 mg/dL
Glucose, Bld: 134 mg/dL

## 2009-05-02 ENCOUNTER — Encounter: Payer: Self-pay | Admitting: Adult Health

## 2009-05-02 ENCOUNTER — Telehealth: Payer: Self-pay | Admitting: Adult Health

## 2009-05-02 ENCOUNTER — Encounter (INDEPENDENT_AMBULATORY_CARE_PROVIDER_SITE_OTHER): Payer: Self-pay | Admitting: *Deleted

## 2009-05-02 LAB — CONVERTED CEMR LAB
BUN: 33 mg/dL
CO2: 27 meq/L
Chloride: 104 meq/L
Creatinine, Ser: 1.13 mg/dL
Glucose, Bld: 134 mg/dL
INR: 1
MCV: 89.2 fL
Potassium: 3.7 meq/L
Prothrombin Time: 12.5 s
WBC: 14.8 10*3/uL

## 2009-05-03 ENCOUNTER — Inpatient Hospital Stay (HOSPITAL_BASED_OUTPATIENT_CLINIC_OR_DEPARTMENT_OTHER): Admission: RE | Admit: 2009-05-03 | Discharge: 2009-05-03 | Payer: Self-pay | Admitting: Cardiovascular Disease

## 2009-05-03 ENCOUNTER — Ambulatory Visit: Payer: Self-pay | Admitting: Cardiology

## 2009-05-05 ENCOUNTER — Ambulatory Visit (HOSPITAL_COMMUNITY): Admission: RE | Admit: 2009-05-05 | Discharge: 2009-05-05 | Payer: Self-pay | Admitting: Cardiology

## 2009-05-05 ENCOUNTER — Encounter: Payer: Self-pay | Admitting: Cardiology

## 2009-05-05 ENCOUNTER — Ambulatory Visit: Payer: Self-pay | Admitting: Cardiology

## 2009-05-05 ENCOUNTER — Encounter (INDEPENDENT_AMBULATORY_CARE_PROVIDER_SITE_OTHER): Payer: Self-pay | Admitting: *Deleted

## 2009-05-19 ENCOUNTER — Ambulatory Visit: Payer: Self-pay | Admitting: Cardiology

## 2009-05-19 ENCOUNTER — Encounter: Payer: Self-pay | Admitting: Adult Health

## 2009-05-30 ENCOUNTER — Ambulatory Visit (HOSPITAL_COMMUNITY): Admission: RE | Admit: 2009-05-30 | Discharge: 2009-05-30 | Payer: Self-pay | Admitting: Cardiology

## 2009-06-01 ENCOUNTER — Encounter: Payer: Self-pay | Admitting: Cardiology

## 2009-06-02 ENCOUNTER — Encounter: Payer: Self-pay | Admitting: Cardiology

## 2009-06-03 ENCOUNTER — Ambulatory Visit (HOSPITAL_COMMUNITY): Admission: RE | Admit: 2009-06-03 | Discharge: 2009-06-03 | Payer: Self-pay | Admitting: Cardiology

## 2009-06-03 ENCOUNTER — Ambulatory Visit: Payer: Self-pay | Admitting: Cardiology

## 2009-06-06 ENCOUNTER — Encounter (INDEPENDENT_AMBULATORY_CARE_PROVIDER_SITE_OTHER): Payer: Self-pay | Admitting: *Deleted

## 2009-06-09 ENCOUNTER — Ambulatory Visit: Payer: Self-pay | Admitting: Cardiology

## 2009-06-09 ENCOUNTER — Encounter (INDEPENDENT_AMBULATORY_CARE_PROVIDER_SITE_OTHER): Payer: Self-pay | Admitting: *Deleted

## 2009-06-15 ENCOUNTER — Ambulatory Visit: Payer: Self-pay | Admitting: Cardiology

## 2009-06-15 ENCOUNTER — Encounter (HOSPITAL_COMMUNITY): Admission: RE | Admit: 2009-06-15 | Discharge: 2009-07-15 | Payer: Self-pay | Admitting: Cardiology

## 2009-06-20 ENCOUNTER — Encounter: Payer: Self-pay | Admitting: Cardiology

## 2009-06-21 ENCOUNTER — Ambulatory Visit: Payer: Self-pay | Admitting: Cardiology

## 2009-06-23 ENCOUNTER — Telehealth (INDEPENDENT_AMBULATORY_CARE_PROVIDER_SITE_OTHER): Payer: Self-pay | Admitting: *Deleted

## 2009-06-24 ENCOUNTER — Encounter: Payer: Self-pay | Admitting: Cardiology

## 2009-06-25 ENCOUNTER — Telehealth: Payer: Self-pay | Admitting: Cardiology

## 2009-06-28 ENCOUNTER — Telehealth (INDEPENDENT_AMBULATORY_CARE_PROVIDER_SITE_OTHER): Payer: Self-pay | Admitting: *Deleted

## 2009-07-01 ENCOUNTER — Encounter: Payer: Self-pay | Admitting: Internal Medicine

## 2009-07-01 ENCOUNTER — Encounter: Payer: Self-pay | Admitting: Cardiology

## 2009-07-05 ENCOUNTER — Encounter: Payer: Self-pay | Admitting: Cardiology

## 2009-07-05 ENCOUNTER — Ambulatory Visit: Payer: Self-pay | Admitting: Surgery

## 2009-07-08 ENCOUNTER — Telehealth: Payer: Self-pay | Admitting: Cardiology

## 2009-07-23 HISTORY — PX: AORTIC VALVE REPLACEMENT: SHX41

## 2009-07-25 ENCOUNTER — Ambulatory Visit: Payer: Self-pay | Admitting: Vascular Surgery

## 2009-07-25 ENCOUNTER — Encounter: Payer: Self-pay | Admitting: Cardiology

## 2009-07-25 ENCOUNTER — Encounter: Payer: Self-pay | Admitting: Surgery

## 2009-07-26 ENCOUNTER — Ambulatory Visit: Payer: Self-pay | Admitting: Surgery

## 2009-07-27 ENCOUNTER — Inpatient Hospital Stay (HOSPITAL_COMMUNITY)
Admission: RE | Admit: 2009-07-27 | Discharge: 2009-08-05 | Payer: Self-pay | Source: Home / Self Care | Admitting: Surgery

## 2009-07-27 ENCOUNTER — Encounter: Payer: Self-pay | Admitting: Surgery

## 2009-07-27 ENCOUNTER — Ambulatory Visit: Payer: Self-pay | Admitting: Surgery

## 2009-08-10 ENCOUNTER — Encounter (INDEPENDENT_AMBULATORY_CARE_PROVIDER_SITE_OTHER): Payer: Self-pay | Admitting: *Deleted

## 2009-08-16 ENCOUNTER — Encounter: Payer: Self-pay | Admitting: Adult Health

## 2009-08-16 ENCOUNTER — Ambulatory Visit: Payer: Self-pay | Admitting: Cardiology

## 2009-08-18 ENCOUNTER — Encounter (INDEPENDENT_AMBULATORY_CARE_PROVIDER_SITE_OTHER): Payer: Self-pay | Admitting: *Deleted

## 2009-08-18 ENCOUNTER — Encounter: Payer: Self-pay | Admitting: Cardiology

## 2009-08-18 LAB — CONVERTED CEMR LAB
BUN: 22 mg/dL (ref 6–23)
Calcium: 10.6 mg/dL — ABNORMAL HIGH (ref 8.4–10.5)
Chloride: 100 meq/L
Chloride: 100 meq/L (ref 96–112)
Cholesterol: 131 mg/dL (ref 0–200)
Creatinine, Ser: 1.13 mg/dL
Creatinine, Ser: 1.13 mg/dL (ref 0.40–1.20)
HDL: 27 mg/dL
HDL: 27 mg/dL — ABNORMAL LOW (ref >39)
LDL Cholesterol: 70 mg/dL
LDL Cholesterol: 70 mg/dL (ref 0–99)
Sodium: 136 meq/L
Triglycerides: 168 mg/dL — ABNORMAL HIGH (ref <150)

## 2009-08-23 ENCOUNTER — Encounter (INDEPENDENT_AMBULATORY_CARE_PROVIDER_SITE_OTHER): Payer: Self-pay | Admitting: *Deleted

## 2009-08-29 ENCOUNTER — Encounter (HOSPITAL_COMMUNITY): Admission: RE | Admit: 2009-08-29 | Discharge: 2009-09-28 | Payer: Self-pay | Admitting: Cardiology

## 2009-08-30 ENCOUNTER — Encounter: Payer: Self-pay | Admitting: Cardiology

## 2009-08-30 ENCOUNTER — Ambulatory Visit: Payer: Self-pay | Admitting: Surgery

## 2009-08-30 ENCOUNTER — Encounter: Admission: RE | Admit: 2009-08-30 | Discharge: 2009-08-30 | Payer: Self-pay | Admitting: Surgery

## 2009-08-30 ENCOUNTER — Telehealth (INDEPENDENT_AMBULATORY_CARE_PROVIDER_SITE_OTHER): Payer: Self-pay

## 2009-09-01 ENCOUNTER — Encounter (INDEPENDENT_AMBULATORY_CARE_PROVIDER_SITE_OTHER): Payer: Self-pay | Admitting: *Deleted

## 2009-09-01 ENCOUNTER — Ambulatory Visit (HOSPITAL_COMMUNITY): Admission: RE | Admit: 2009-09-01 | Discharge: 2009-09-01 | Payer: Self-pay | Admitting: Surgery

## 2009-09-02 ENCOUNTER — Encounter (INDEPENDENT_AMBULATORY_CARE_PROVIDER_SITE_OTHER): Payer: Self-pay | Admitting: Internal Medicine

## 2009-09-02 ENCOUNTER — Ambulatory Visit: Payer: Self-pay | Admitting: Cardiovascular Disease

## 2009-09-13 ENCOUNTER — Encounter: Admission: RE | Admit: 2009-09-13 | Discharge: 2009-09-13 | Payer: Self-pay | Admitting: Surgery

## 2009-09-13 ENCOUNTER — Ambulatory Visit: Payer: Self-pay | Admitting: Surgery

## 2009-09-14 ENCOUNTER — Ambulatory Visit: Payer: Self-pay | Admitting: Cardiology

## 2009-09-14 ENCOUNTER — Encounter (INDEPENDENT_AMBULATORY_CARE_PROVIDER_SITE_OTHER): Payer: Self-pay | Admitting: *Deleted

## 2009-09-14 LAB — CONVERTED CEMR LAB
BUN: 36 mg/dL
BUN: 36 mg/dL — ABNORMAL HIGH (ref 6–23)
CO2: 29 meq/L (ref 19–32)
Calcium: 10.1 mg/dL
Chloride: 95 meq/L — ABNORMAL LOW (ref 96–112)
Glucose, Bld: 191 mg/dL
Potassium: 4 meq/L (ref 3.5–5.3)
Sodium: 139 meq/L

## 2009-09-20 ENCOUNTER — Ambulatory Visit (HOSPITAL_COMMUNITY): Admission: RE | Admit: 2009-09-20 | Discharge: 2009-09-20 | Payer: Self-pay | Admitting: Cardiology

## 2009-09-20 ENCOUNTER — Telehealth (INDEPENDENT_AMBULATORY_CARE_PROVIDER_SITE_OTHER): Payer: Self-pay | Admitting: *Deleted

## 2009-09-20 ENCOUNTER — Ambulatory Visit: Payer: Self-pay | Admitting: Cardiology

## 2009-09-21 ENCOUNTER — Encounter: Payer: Self-pay | Admitting: Adult Health

## 2009-09-23 ENCOUNTER — Encounter: Payer: Self-pay | Admitting: Cardiology

## 2009-09-26 ENCOUNTER — Encounter: Payer: Self-pay | Admitting: Cardiology

## 2009-09-26 LAB — CONVERTED CEMR LAB
BUN: 29 mg/dL — ABNORMAL HIGH (ref 6–23)
Chloride: 102 meq/L (ref 96–112)
Creatinine, Ser: 1.21 mg/dL — ABNORMAL HIGH (ref 0.40–1.20)
Glucose, Bld: 177 mg/dL — ABNORMAL HIGH (ref 70–99)
Potassium: 3.7 meq/L (ref 3.5–5.3)

## 2009-09-27 ENCOUNTER — Encounter (INDEPENDENT_AMBULATORY_CARE_PROVIDER_SITE_OTHER): Payer: Self-pay | Admitting: *Deleted

## 2009-09-28 ENCOUNTER — Encounter (HOSPITAL_COMMUNITY): Admission: RE | Admit: 2009-09-28 | Discharge: 2009-10-28 | Payer: Self-pay | Admitting: Cardiology

## 2009-09-28 ENCOUNTER — Encounter (INDEPENDENT_AMBULATORY_CARE_PROVIDER_SITE_OTHER): Payer: Self-pay | Admitting: *Deleted

## 2009-09-29 ENCOUNTER — Ambulatory Visit: Payer: Self-pay | Admitting: Cardiology

## 2009-09-29 ENCOUNTER — Encounter (INDEPENDENT_AMBULATORY_CARE_PROVIDER_SITE_OTHER): Payer: Self-pay | Admitting: *Deleted

## 2009-09-29 DIAGNOSIS — K589 Irritable bowel syndrome without diarrhea: Secondary | ICD-10-CM | POA: Insufficient documentation

## 2009-09-29 DIAGNOSIS — Z87898 Personal history of other specified conditions: Secondary | ICD-10-CM | POA: Insufficient documentation

## 2009-09-29 DIAGNOSIS — Z86010 Personal history of colonic polyps: Secondary | ICD-10-CM

## 2009-09-30 ENCOUNTER — Telehealth: Payer: Self-pay | Admitting: Adult Health

## 2009-10-03 ENCOUNTER — Encounter: Payer: Self-pay | Admitting: Cardiology

## 2009-10-07 ENCOUNTER — Telehealth: Payer: Self-pay | Admitting: Internal Medicine

## 2009-10-10 ENCOUNTER — Ambulatory Visit: Payer: Self-pay | Admitting: Internal Medicine

## 2009-10-10 ENCOUNTER — Encounter (INDEPENDENT_AMBULATORY_CARE_PROVIDER_SITE_OTHER): Payer: Self-pay | Admitting: *Deleted

## 2009-10-10 ENCOUNTER — Encounter: Payer: Self-pay | Admitting: Internal Medicine

## 2009-10-10 ENCOUNTER — Ambulatory Visit: Payer: Self-pay | Admitting: Gastroenterology

## 2009-10-10 ENCOUNTER — Inpatient Hospital Stay (HOSPITAL_COMMUNITY): Admission: AD | Admit: 2009-10-10 | Discharge: 2009-10-13 | Payer: Self-pay | Admitting: Internal Medicine

## 2009-10-10 LAB — CONVERTED CEMR LAB
Albumin: 4.6 g/dL
Alkaline Phosphatase: 87 units/L
Brain Natriuretic Peptide: 58.3
CO2: 26 meq/L
Chloride: 103 meq/L
Cholesterol: 135 mg/dL
Free T4: 0.8 ng/dL
Hemoglobin: 11.7 g/dL
LDL Cholesterol: 54 mg/dL
Sodium: 140 meq/L
TSH: 2.52 microintl units/mL
Total Protein: 8 g/dL
WBC: 7.5 10*3/uL

## 2009-10-11 ENCOUNTER — Ambulatory Visit: Payer: Self-pay | Admitting: Internal Medicine

## 2009-10-12 ENCOUNTER — Encounter: Payer: Self-pay | Admitting: Cardiology

## 2009-10-13 LAB — CONVERTED CEMR LAB
CO2: 24 meq/L
Chloride: 110 meq/L
Creatinine, Ser: 0.86 mg/dL
Sodium: 142 meq/L
WBC: 8.2 10*3/uL

## 2009-10-14 ENCOUNTER — Telehealth (INDEPENDENT_AMBULATORY_CARE_PROVIDER_SITE_OTHER): Payer: Self-pay | Admitting: *Deleted

## 2009-10-17 ENCOUNTER — Telehealth (INDEPENDENT_AMBULATORY_CARE_PROVIDER_SITE_OTHER): Payer: Self-pay | Admitting: *Deleted

## 2009-10-20 ENCOUNTER — Encounter: Payer: Self-pay | Admitting: Internal Medicine

## 2009-10-21 ENCOUNTER — Ambulatory Visit (HOSPITAL_COMMUNITY): Admission: RE | Admit: 2009-10-21 | Discharge: 2009-10-21 | Payer: Self-pay | Admitting: Family Medicine

## 2009-10-21 ENCOUNTER — Ambulatory Visit (HOSPITAL_COMMUNITY): Admission: RE | Admit: 2009-10-21 | Discharge: 2009-10-21 | Payer: Self-pay | Admitting: Cardiology

## 2009-10-24 ENCOUNTER — Ambulatory Visit: Payer: Self-pay | Admitting: Cardiology

## 2009-10-28 ENCOUNTER — Encounter: Payer: Self-pay | Admitting: Cardiology

## 2009-10-28 ENCOUNTER — Encounter (HOSPITAL_COMMUNITY): Admission: RE | Admit: 2009-10-28 | Discharge: 2009-11-27 | Payer: Self-pay | Admitting: Cardiology

## 2009-10-31 ENCOUNTER — Ambulatory Visit: Payer: Self-pay | Admitting: Internal Medicine

## 2009-11-01 ENCOUNTER — Encounter (INDEPENDENT_AMBULATORY_CARE_PROVIDER_SITE_OTHER): Payer: Self-pay | Admitting: *Deleted

## 2009-11-16 ENCOUNTER — Encounter (INDEPENDENT_AMBULATORY_CARE_PROVIDER_SITE_OTHER): Payer: Self-pay | Admitting: *Deleted

## 2009-11-16 LAB — CONVERTED CEMR LAB
ALT: 20 units/L
ALT: 20 units/L (ref 0–35)
AST: 22 units/L (ref 0–37)
Albumin: 4.4 g/dL
Basophils Relative: 1 % (ref 0–1)
CO2: 26 meq/L
Calcium: 10.3 mg/dL
Chloride: 104 meq/L
Creatinine, Ser: 1.21 mg/dL — ABNORMAL HIGH (ref 0.40–1.20)
Eosinophils Absolute: 0.2 10*3/uL (ref 0.0–0.7)
Lymphs Abs: 1.7 10*3/uL (ref 0.7–4.0)
MCHC: 30.8 g/dL (ref 30.0–36.0)
MCV: 88 fL
MCV: 88 fL (ref 78.0–100.0)
Monocytes Relative: 6 % (ref 3–12)
Neutro Abs: 6 10*3/uL (ref 1.7–7.7)
Neutrophils Relative %: 71 % (ref 43–77)
Platelets: 183 10*3/uL
Platelets: 183 10*3/uL (ref 150–400)
Potassium: 3.7 meq/L
RBC: 3.99 M/uL (ref 3.87–5.11)
Sodium: 142 meq/L
Sodium: 142 meq/L (ref 135–145)
Total Bilirubin: 0.4 mg/dL (ref 0.3–1.2)
Total Protein: 7.3 g/dL
Total Protein: 7.3 g/dL (ref 6.0–8.3)
WBC: 8.4 10*3/uL (ref 4.0–10.5)

## 2009-11-17 ENCOUNTER — Ambulatory Visit: Payer: Self-pay | Admitting: Cardiology

## 2009-11-17 ENCOUNTER — Encounter (INDEPENDENT_AMBULATORY_CARE_PROVIDER_SITE_OTHER): Payer: Self-pay | Admitting: *Deleted

## 2009-11-17 ENCOUNTER — Ambulatory Visit (HOSPITAL_COMMUNITY): Admission: RE | Admit: 2009-11-17 | Discharge: 2009-11-17 | Payer: Self-pay | Admitting: Cardiology

## 2009-11-21 ENCOUNTER — Encounter: Payer: Self-pay | Admitting: Cardiology

## 2009-11-28 ENCOUNTER — Encounter (INDEPENDENT_AMBULATORY_CARE_PROVIDER_SITE_OTHER): Payer: Self-pay | Admitting: *Deleted

## 2009-11-28 ENCOUNTER — Encounter (INDEPENDENT_AMBULATORY_CARE_PROVIDER_SITE_OTHER): Payer: Self-pay

## 2009-11-28 ENCOUNTER — Encounter (HOSPITAL_COMMUNITY): Admission: RE | Admit: 2009-11-28 | Discharge: 2009-12-28 | Payer: Self-pay | Admitting: Cardiology

## 2009-11-28 LAB — CONVERTED CEMR LAB
ALT: 16 units/L
ALT: 16 units/L
AST: 23 units/L
AST: 23 units/L
Bilirubin, Direct: 55 mg/dL
CO2: 25 meq/L
CO2: 25 meq/L
Calcium: 10.6 mg/dL
Chloride: 101 meq/L
Glomerular Filtration Rate, Af Am: >60 mL/min/{1.73_m2}
Glomerular Filtration Rate, Af Am: >60 mL/min/{1.73_m2}
Glucose, Bld: 74 mg/dL
HCT: 37.5 %
HCT: 37.5 %
Hemoglobin: 11.5 g/dL
Platelets: 214 10*3/uL
Potassium: 4.2 meq/L
Potassium: 4.2 meq/L
Pro B Natriuretic peptide (BNP): 115.1 pg/mL — ABNORMAL HIGH (ref 0.0–100.0)
Sodium: 141 meq/L
Sodium: 141 meq/L
Total Protein: 7.4 g/dL

## 2009-11-29 ENCOUNTER — Encounter (INDEPENDENT_AMBULATORY_CARE_PROVIDER_SITE_OTHER): Payer: Self-pay

## 2009-12-05 ENCOUNTER — Encounter: Payer: Self-pay | Admitting: Cardiology

## 2009-12-06 ENCOUNTER — Encounter: Payer: Self-pay | Admitting: Cardiology

## 2009-12-06 ENCOUNTER — Encounter (INDEPENDENT_AMBULATORY_CARE_PROVIDER_SITE_OTHER): Payer: Self-pay | Admitting: *Deleted

## 2009-12-08 ENCOUNTER — Ambulatory Visit: Payer: Self-pay | Admitting: Cardiology

## 2009-12-09 ENCOUNTER — Encounter: Payer: Self-pay | Admitting: Cardiology

## 2009-12-15 ENCOUNTER — Encounter (INDEPENDENT_AMBULATORY_CARE_PROVIDER_SITE_OTHER): Payer: Self-pay | Admitting: *Deleted

## 2009-12-15 LAB — CONVERTED CEMR LAB
Albumin: 4.8 g/dL
CO2: 29 meq/L
Calcium: 10.8 mg/dL
Chloride: 99 meq/L
Glucose, Bld: 124 mg/dL
Potassium: 3.5 meq/L
Sodium: 141 meq/L

## 2009-12-22 ENCOUNTER — Encounter: Payer: Self-pay | Admitting: Cardiology

## 2009-12-22 LAB — CONVERTED CEMR LAB
CO2: 29 meq/L (ref 19–32)
Creatinine, Ser: 1.27 mg/dL — ABNORMAL HIGH (ref 0.40–1.20)
Glucose, Bld: 124 mg/dL — ABNORMAL HIGH (ref 70–99)
Total Bilirubin: 0.4 mg/dL (ref 0.3–1.2)

## 2009-12-30 ENCOUNTER — Encounter (HOSPITAL_COMMUNITY): Admission: RE | Admit: 2009-12-30 | Discharge: 2010-01-29 | Payer: Self-pay | Admitting: Cardiology

## 2010-01-03 ENCOUNTER — Ambulatory Visit (HOSPITAL_COMMUNITY): Admission: RE | Admit: 2010-01-03 | Discharge: 2010-01-03 | Payer: Self-pay | Admitting: Urology

## 2010-01-05 ENCOUNTER — Encounter (INDEPENDENT_AMBULATORY_CARE_PROVIDER_SITE_OTHER): Payer: Self-pay | Admitting: *Deleted

## 2010-01-06 ENCOUNTER — Encounter: Payer: Self-pay | Admitting: Internal Medicine

## 2010-01-11 ENCOUNTER — Ambulatory Visit: Payer: Self-pay | Admitting: Oncology

## 2010-01-11 ENCOUNTER — Ambulatory Visit: Payer: Self-pay | Admitting: Cardiology

## 2010-01-12 ENCOUNTER — Encounter: Payer: Self-pay | Admitting: Internal Medicine

## 2010-01-13 ENCOUNTER — Encounter: Payer: Self-pay | Admitting: Cardiology

## 2010-02-03 ENCOUNTER — Encounter: Payer: Self-pay | Admitting: Cardiology

## 2010-02-21 ENCOUNTER — Encounter: Payer: Self-pay | Admitting: Cardiology

## 2010-02-23 ENCOUNTER — Encounter (INDEPENDENT_AMBULATORY_CARE_PROVIDER_SITE_OTHER): Payer: Self-pay | Admitting: *Deleted

## 2010-02-23 LAB — CONVERTED CEMR LAB
BUN: 54 mg/dL
Chloride: 95 meq/L
Chloride: 95 meq/L — ABNORMAL LOW (ref 96–112)
Creatinine, Ser: 1.54 mg/dL — ABNORMAL HIGH (ref 0.40–1.20)
Glucose, Bld: 195 mg/dL
Potassium: 3.8 meq/L
Potassium: 3.8 meq/L (ref 3.5–5.3)

## 2010-02-24 ENCOUNTER — Encounter (INDEPENDENT_AMBULATORY_CARE_PROVIDER_SITE_OTHER): Payer: Self-pay | Admitting: *Deleted

## 2010-03-03 ENCOUNTER — Encounter (INDEPENDENT_AMBULATORY_CARE_PROVIDER_SITE_OTHER): Payer: Self-pay | Admitting: *Deleted

## 2010-03-03 ENCOUNTER — Telehealth (INDEPENDENT_AMBULATORY_CARE_PROVIDER_SITE_OTHER): Payer: Self-pay | Admitting: *Deleted

## 2010-03-08 ENCOUNTER — Encounter: Admission: RE | Admit: 2010-03-08 | Discharge: 2010-03-08 | Payer: Self-pay | Admitting: Oncology

## 2010-03-21 ENCOUNTER — Telehealth (INDEPENDENT_AMBULATORY_CARE_PROVIDER_SITE_OTHER): Payer: Self-pay | Admitting: *Deleted

## 2010-03-22 ENCOUNTER — Encounter (INDEPENDENT_AMBULATORY_CARE_PROVIDER_SITE_OTHER): Payer: Self-pay | Admitting: *Deleted

## 2010-03-22 LAB — CONVERTED CEMR LAB
Albumin: 4.8 g/dL
Albumin: 4.8 g/dL (ref 3.5–5.2)
Alkaline Phosphatase: 53 units/L (ref 39–117)
BUN: 38 mg/dL — ABNORMAL HIGH (ref 6–23)
CO2: 26 meq/L
Calcium: 10.1 mg/dL
Chloride: 104 meq/L
Cholesterol: 127 mg/dL (ref 0–200)
Glucose, Bld: 135 mg/dL — ABNORMAL HIGH (ref 70–99)
HDL: 36 mg/dL — ABNORMAL LOW (ref >39)
LDL Cholesterol: 61 mg/dL
LDL Cholesterol: 61 mg/dL (ref 0–99)
Potassium: 4.3 meq/L
Potassium: 4.3 meq/L (ref 3.5–5.3)
Sodium: 142 meq/L
Total Protein: 7.6 g/dL
Triglycerides: 151 mg/dL — ABNORMAL HIGH (ref <150)

## 2010-04-06 ENCOUNTER — Encounter (INDEPENDENT_AMBULATORY_CARE_PROVIDER_SITE_OTHER): Payer: Self-pay | Admitting: *Deleted

## 2010-04-11 ENCOUNTER — Ambulatory Visit: Payer: Self-pay | Admitting: Cardiology

## 2010-04-11 DIAGNOSIS — E785 Hyperlipidemia, unspecified: Secondary | ICD-10-CM | POA: Insufficient documentation

## 2010-04-11 DIAGNOSIS — M2550 Pain in unspecified joint: Secondary | ICD-10-CM | POA: Insufficient documentation

## 2010-04-21 ENCOUNTER — Encounter: Payer: Self-pay | Admitting: Internal Medicine

## 2010-04-21 ENCOUNTER — Encounter (INDEPENDENT_AMBULATORY_CARE_PROVIDER_SITE_OTHER): Payer: Self-pay | Admitting: *Deleted

## 2010-04-21 LAB — CONVERTED CEMR LAB
ALT: 16 units/L
Albumin: 4.7 g/dL
Alkaline Phosphatase: 61 units/L
CO2: 29 meq/L
Casts: NEGATIVE /lpf
Folate: >20 ng/mL
Free T4: 0.7 ng/dL
Glucose, Bld: 111 mg/dL
HCT: 36.3 %
Hemoglobin: 11 g/dL
Hgb A1c MFr Bld: 7.4 %
LDL Cholesterol: 71 mg/dL
Nitrite: NEGATIVE
Potassium: 3.9 meq/L
Protein, ur: NEGATIVE mg/dL
Sodium: 141 meq/L
Specific Gravity, Urine: 1.015
TSH: 1.75 microintl units/mL
Total Protein: 8.5 g/dL
Vitamin B-12: 815 pg/mL
pH: 7

## 2010-05-04 ENCOUNTER — Encounter: Payer: Self-pay | Admitting: Cardiology

## 2010-05-11 ENCOUNTER — Encounter (INDEPENDENT_AMBULATORY_CARE_PROVIDER_SITE_OTHER): Payer: Self-pay | Admitting: *Deleted

## 2010-05-11 ENCOUNTER — Ambulatory Visit: Payer: Self-pay | Admitting: Cardiology

## 2010-05-23 ENCOUNTER — Ambulatory Visit: Payer: Self-pay | Admitting: Cardiology

## 2010-05-23 ENCOUNTER — Ambulatory Visit (HOSPITAL_COMMUNITY): Admission: RE | Admit: 2010-05-23 | Discharge: 2010-05-23 | Payer: Self-pay | Admitting: Cardiology

## 2010-05-23 ENCOUNTER — Encounter: Payer: Self-pay | Admitting: Cardiology

## 2010-06-29 ENCOUNTER — Inpatient Hospital Stay (HOSPITAL_COMMUNITY): Admission: EM | Admit: 2010-06-29 | Discharge: 2009-09-05 | Payer: Self-pay | Admitting: Emergency Medicine

## 2010-06-30 ENCOUNTER — Encounter
Admission: RE | Admit: 2010-06-30 | Discharge: 2010-06-30 | Payer: Self-pay | Source: Home / Self Care | Attending: Obstetrics and Gynecology | Admitting: Obstetrics and Gynecology

## 2010-07-25 ENCOUNTER — Encounter: Payer: Self-pay | Admitting: Cardiology

## 2010-07-25 LAB — CONVERTED CEMR LAB
ALT: 19 units/L
Albumin: 4.9 g/dL
BUN: 43 mg/dL
CO2: 22 meq/L
Chloride: 106 meq/L
Glucose, Bld: 242 mg/dL
Potassium: 4.2 meq/L
Total Protein: 8.1 g/dL

## 2010-07-26 ENCOUNTER — Encounter: Payer: Self-pay | Admitting: Internal Medicine

## 2010-07-28 ENCOUNTER — Encounter: Payer: Self-pay | Admitting: Internal Medicine

## 2010-07-28 ENCOUNTER — Encounter (INDEPENDENT_AMBULATORY_CARE_PROVIDER_SITE_OTHER): Payer: Self-pay | Admitting: *Deleted

## 2010-07-28 ENCOUNTER — Ambulatory Visit
Admission: RE | Admit: 2010-07-28 | Discharge: 2010-07-28 | Payer: Self-pay | Source: Home / Self Care | Attending: Cardiology | Admitting: Cardiology

## 2010-07-31 ENCOUNTER — Encounter: Payer: Self-pay | Admitting: Cardiology

## 2010-08-08 ENCOUNTER — Ambulatory Visit
Admission: RE | Admit: 2010-08-08 | Discharge: 2010-08-08 | Payer: Self-pay | Source: Home / Self Care | Attending: Internal Medicine | Admitting: Internal Medicine

## 2010-08-08 ENCOUNTER — Inpatient Hospital Stay (HOSPITAL_COMMUNITY)
Admission: EM | Admit: 2010-08-08 | Discharge: 2010-08-10 | Payer: Self-pay | Source: Home / Self Care | Attending: Internal Medicine | Admitting: Internal Medicine

## 2010-08-08 ENCOUNTER — Telehealth: Payer: Self-pay | Admitting: Internal Medicine

## 2010-08-08 ENCOUNTER — Encounter: Payer: Self-pay | Admitting: *Deleted

## 2010-08-08 LAB — CONVERTED CEMR LAB
ALT: 34 units/L
Albumin: 4.6 g/dL
BUN: 32 mg/dL
CO2: 28 meq/L
Chloride: 103 meq/L
Glucose, Bld: 98 mg/dL
Hemoglobin: 13.7 g/dL
Lymphocytes Relative: 94 %
Lymphs Abs: 0.4 10*3/uL
MCV: 89.7 fL
Monocytes Relative: 3 %
Neutro Abs: 12 10*3/uL
Platelets: 174 10*3/uL
Potassium: 4.8 meq/L
RBC: 4.66 M/uL
RDW: 15.3 %
Sodium: 143 meq/L
Total Protein: 8.3 g/dL

## 2010-08-09 ENCOUNTER — Encounter: Payer: Self-pay | Admitting: *Deleted

## 2010-08-09 LAB — CONVERTED CEMR LAB
ALT: 26 units/L
AST: 37 units/L
CO2: 26 meq/L
Calcium: 9.2 mg/dL
GFR calc non Af Amer: 50 mL/min
Glomerular Filtration Rate, Af Am: >60 mL/min/{1.73_m2}
Glucose, Bld: 122 mg/dL
MCV: 89.6 fL
Platelets: 128 10*3/uL
Potassium: 4 meq/L
RBC: 3.74 M/uL
Sodium: 143 meq/L
Total Protein: 6.6 g/dL
WBC: 5.5 10*3/uL

## 2010-08-09 LAB — DIFFERENTIAL
Basophils Absolute: 0 10*3/uL (ref 0.0–0.1)
Basophils Relative: 0 % (ref 0–1)
Eosinophils Absolute: 0 10*3/uL (ref 0.0–0.7)
Eosinophils Relative: 0 % (ref 0–5)
Lymphocytes Relative: 3 % — ABNORMAL LOW (ref 12–46)
Lymphs Abs: 0.4 10*3/uL — ABNORMAL LOW (ref 0.7–4.0)
Monocytes Absolute: 0.4 10*3/uL (ref 0.1–1.0)
Monocytes Relative: 3 % (ref 3–12)
Neutro Abs: 12 10*3/uL — ABNORMAL HIGH (ref 1.7–7.7)
Neutrophils Relative %: 94 % — ABNORMAL HIGH (ref 43–77)

## 2010-08-09 LAB — COMPREHENSIVE METABOLIC PANEL
ALT: 34 U/L (ref 0–35)
AST: 61 U/L — ABNORMAL HIGH (ref 0–37)
Albumin: 4.6 g/dL (ref 3.5–5.2)
Alkaline Phosphatase: 72 U/L (ref 39–117)
BUN: 32 mg/dL — ABNORMAL HIGH (ref 6–23)
CO2: 28 mEq/L (ref 19–32)
Calcium: 10.1 mg/dL (ref 8.4–10.5)
Chloride: 103 mEq/L (ref 96–112)
Creatinine, Ser: 1.07 mg/dL (ref 0.4–1.2)
GFR calc Af Amer: >60 mL/min (ref >60)
GFR calc non Af Amer: 53 mL/min — ABNORMAL LOW (ref >60)
Glucose, Bld: 95 mg/dL (ref 70–99)
Potassium: 4.8 mEq/L (ref 3.5–5.1)
Sodium: 143 mEq/L (ref 135–145)
Total Bilirubin: 1 mg/dL (ref 0.3–1.2)
Total Protein: 8.3 g/dL (ref 6.0–8.3)

## 2010-08-09 LAB — URINALYSIS, ROUTINE W REFLEX MICROSCOPIC
Bilirubin Urine: NEGATIVE
Hgb urine dipstick: NEGATIVE
Ketones, ur: NEGATIVE mg/dL
Leukocytes, UA: NEGATIVE
Nitrite: NEGATIVE
Protein, ur: 100 mg/dL — AB
Specific Gravity, Urine: 1.02 (ref 1.005–1.030)
Urine Glucose, Fasting: NEGATIVE mg/dL
Urobilinogen, UA: 0.2 mg/dL (ref 0.0–1.0)
pH: 8 (ref 5.0–8.0)

## 2010-08-09 LAB — LIPASE, BLOOD: Lipase: 40 U/L (ref 11–59)

## 2010-08-09 LAB — CBC
HCT: 41.8 % (ref 36.0–46.0)
Hemoglobin: 13.7 g/dL (ref 12.0–15.0)
MCH: 29.4 pg (ref 26.0–34.0)
MCHC: 32.8 g/dL (ref 30.0–36.0)
MCV: 89.7 fL (ref 78.0–100.0)
Platelets: 174 10*3/uL (ref 150–400)
RBC: 4.66 MIL/uL (ref 3.87–5.11)
RDW: 15.3 % (ref 11.5–15.5)
WBC: 12.8 10*3/uL — ABNORMAL HIGH (ref 4.0–10.5)

## 2010-08-09 LAB — URINE MICROSCOPIC-ADD ON

## 2010-08-09 LAB — LACTIC ACID, PLASMA: Lactic Acid, Venous: 2.5 mmol/L — ABNORMAL HIGH (ref 0.5–2.2)

## 2010-08-09 LAB — GLUCOSE, CAPILLARY
Glucose-Capillary: 124 mg/dL — ABNORMAL HIGH (ref 70–99)
Glucose-Capillary: 106 mg/dL — ABNORMAL HIGH (ref 70–99)

## 2010-08-10 ENCOUNTER — Encounter: Payer: Self-pay | Admitting: *Deleted

## 2010-08-10 LAB — CONVERTED CEMR LAB
HCT: 35.8 %
MCV: 91.8 fL
RDW: 30.7 %
WBC: 7 10*3/uL

## 2010-08-13 ENCOUNTER — Encounter: Payer: Self-pay | Admitting: Family Medicine

## 2010-08-14 LAB — COMPREHENSIVE METABOLIC PANEL
ALT: 27 U/L (ref 0–35)
Alkaline Phosphatase: 58 U/L (ref 39–117)
BUN: 26 mg/dL — ABNORMAL HIGH (ref 6–23)
CO2: 26 mEq/L (ref 19–32)
CO2: 29 mEq/L (ref 19–32)
Calcium: 9.5 mg/dL (ref 8.4–10.5)
Chloride: 110 mEq/L (ref 96–112)
Chloride: 108 mEq/L (ref 96–112)
Creatinine, Ser: 1.12 mg/dL (ref 0.4–1.2)
GFR calc non Af Amer: 50 mL/min — ABNORMAL LOW (ref >60)
GFR calc non Af Amer: 51 mL/min — ABNORMAL LOW (ref >60)
Glucose, Bld: 103 mg/dL — ABNORMAL HIGH (ref 70–99)
Sodium: 142 mEq/L (ref 135–145)
Total Bilirubin: 0.8 mg/dL (ref 0.3–1.2)
Total Bilirubin: 0.7 mg/dL (ref 0.3–1.2)

## 2010-08-14 LAB — GLUCOSE, CAPILLARY
Glucose-Capillary: 114 mg/dL — ABNORMAL HIGH (ref 70–99)
Glucose-Capillary: 87 mg/dL (ref 70–99)
Glucose-Capillary: 73 mg/dL (ref 70–99)
Glucose-Capillary: 97 mg/dL (ref 70–99)
Glucose-Capillary: 89 mg/dL (ref 70–99)
Glucose-Capillary: 98 mg/dL (ref 70–99)
Glucose-Capillary: 92 mg/dL (ref 70–99)

## 2010-08-14 LAB — CBC
HCT: 33.5 % — ABNORMAL LOW (ref 36.0–46.0)
HCT: 35.8 % — ABNORMAL LOW (ref 36.0–46.0)
Hemoglobin: 10.7 g/dL — ABNORMAL LOW (ref 12.0–15.0)
MCH: 28.6 pg (ref 26.0–34.0)
MCHC: 30.7 g/dL (ref 30.0–36.0)
MCV: 89.6 fL (ref 78.0–100.0)
Platelets: 142 10*3/uL — ABNORMAL LOW (ref 150–400)
RBC: 3.74 MIL/uL — ABNORMAL LOW (ref 3.87–5.11)
RDW: 16.4 % — ABNORMAL HIGH (ref 11.5–15.5)
WBC: 7 10*3/uL (ref 4.0–10.5)

## 2010-08-21 ENCOUNTER — Encounter: Payer: Self-pay | Admitting: Cardiology

## 2010-08-22 NOTE — Miscellaneous (Signed)
Summary: [facility]  Clinical Lists Changes  Observations: Added new observation of CALCIUM: 10.1 mg/dL (10/10/2009 9:20) Added new observation of ALBUMIN: 4.6 g/dL (10/10/2009 9:20) Added new observation of PROTEIN, TOT: 8.0 g/dL (10/10/2009 9:20) Added new observation of SGPT (ALT): 27 units/L (10/10/2009 9:20) Added new observation of SGOT (AST): 21 units/L (10/10/2009 9:20) Added new observation of ALK PHOS: 87 units/L (10/10/2009 9:20) Added new observation of CREATININE: 1.0 mg/dL (10/10/2009 9:20) Added new observation of BUN: 22 mg/dL (10/10/2009 9:20) Added new observation of BG RANDOM: 195 mg/dL (10/10/2009 9:20) Added new observation of CO2 PLSM/SER: 26 meq/L (10/10/2009 9:20) Added new observation of CL SERUM: 103 meq/L (10/10/2009 9:20) Added new observation of K SERUM: 3.7 meq/L (10/10/2009 9:20) Added new observation of NA: 140 meq/L (10/10/2009 9:20) Added new observation of LDL: 54 mg/dL (10/10/2009 9:20) Added new observation of HDL: 36 mg/dL (10/10/2009 9:20) Added new observation of TRIGLYC TOT: 223 mg/dL (10/10/2009 9:20) Added new observation of CHOLESTEROL: 135 mg/dL (10/10/2009 9:20) Added new observation of PLATELETK/UL: 247 K/uL (10/10/2009 9:20) Added new observation of MCV: 85.5 fL (10/10/2009 9:20) Added new observation of HCT: 37.0 % (10/10/2009 9:20) Added new observation of HGB: 11.7 g/dL (10/10/2009 9:20) Added new observation of WBC COUNT: 7.5 10*3/microliter (10/10/2009 9:20) Added new observation of TSH: 2.52 microintl units/mL (10/10/2009 9:20) Added new observation of T4, FREE: 0.80 ng/dL (10/10/2009 9:20) Added new observation of BNP: 58.3  (10/10/2009 9:20) Added new observation of HGBA1C: 7.6 % (10/10/2009 9:20)

## 2010-08-22 NOTE — Letter (Signed)
Summary: [facility]   Imported By: Sallee Provencal 02/14/2010 11:07:58  _____________________________________________________________________  External Attachment:    Type:   Image     Comment:   External Document

## 2010-08-22 NOTE — Assessment & Plan Note (Signed)
Summary: 3 mth f/u per checkout on 01/11/10/tg  Medications Added CRESTOR 10 MG TABS (ROSUVASTATIN CALCIUM) Take one tablet by mouth daily. MULTIVITAMINS   TABS (MULTIPLE VITAMIN) one tablet by mouth daily FUROSEMIDE 80 MG TABS (FUROSEMIDE) take 1 tablet by mouth by mouth once daily ALPRAZOLAM 0.5 MG TABS (ALPRAZOLAM) TAKE 1/2 tab daily COQ10 100 MG CAPS (COENZYME Q10) take 2 tabs daily FUROSEMIDE 40 MG TABS (FUROSEMIDE) Take one tablet by mouth daily as needed on days when wt is increased 3-4 lbs ASPIR-LOW 81 MG TBEC (ASPIRIN) take 1 tab daily NITROLINGUAL 0.4 MG/SPRAY SOLN (NITROGLYCERIN) One spray under tongue every 5 minutes as needed for chest pain---may repeat times three ALEVE 220 MG TABS (NAPROXEN SODIUM) 1-2 tablets two times a day as needed arthiritc pain CRESTOR 10 MG TABS (ROSUVASTATIN CALCIUM) Take one tablet by mouth daily.      Allergies Added:   Visit Type:  Follow-up Referring Provider:  Dr. Elyse Hsu Primary Provider:  [name], MD   History of Present Illness: Return visit for this delightful woman with a history of Hodgkin's disease and multiple subsequent complications related to her initial therapy.  I suspect that coronary disease or valvular heart disease or both are sequela of radiation therapy.  Currently, she experiences PND approximately once every 2 weeks.  Sitting up or walking around the house generally results in improvement; however, at  times symptoms are not controlled until she takes 40 mg of furosemide.  On one occasion during the past 3 months, she started to drive to the emergency department, but symptoms subsided prior to her arrival.  She remains fairly active and walks on a treadmill 2 days per week.  She can walk up to 4 miles on level ground without much difficulty.  He has not had chest discomfort.  She has not noted palpitations or pedal edema.  Weight remains quite stable, varying no more than 4 pounds.  Current Medications  (verified): 1)  Tricor 145 Mg Tabs (Fenofibrate) .Marland Kitchen.. 1 Tablet By Mouth Once Daily 2)  Januvia 100 Mg Tabs (Sitagliptin Phosphate) .Marland Kitchen.. 1 Tablet By Mouth Once Daily 3)  Crestor 10 Mg Tabs (Rosuvastatin Calcium) .... Take One Tablet By Mouth Daily. 4)  Zetia 10 Mg Tabs (Ezetimibe) .Marland Kitchen.. 1 Tablet By Mouth At Bedtime 5)  Apidra 100 Unit/ml Soln (Insulin Glulisine) .Marland KitchenMarland KitchenMarland Kitchen 175 Units Daily 6)  Calcium 600/vitamin D 600-400 Mg-Unit Tabs (Calcium Carbonate-Vitamin D) .Marland Kitchen.. 1 Tablet By Mouth Two Times A Day 7)  Multivitamins   Tabs (Multiple Vitamin) .... One Tablet By Mouth Daily 8)  Benefiber  Tabs (Wheat Dextrin) .... 2 By Mouth Once Daily 9)  Furosemide 80 Mg Tabs (Furosemide) .... Take 1 Tablet By Mouth By Mouth Once Daily 10)  Tums 500 Mg Chew (Calcium Carbonate Antacid) .... As Needed 11)  Nitroglycerin 0.4 Mg Subl (Nitroglycerin) .... One Tablet Under Tongue Every 5 Minutes As Needed For Chest Pain---May Repeat Times Three 12)  Alprazolam 0.5 Mg Tabs (Alprazolam) .... Take 1/2 Tab Daily 13)  Metoprolol Tartrate 25 Mg Tabs (Metoprolol Tartrate) .... Take 1 Tab Two Times A Day 14)  Vitamin C Cr 500 Mg Cr-Tabs (Ascorbic Acid) .... Take 1 Tab Daily 15)  Coq10 100 Mg Caps (Coenzyme Q10) .... Take 2 Tabs Daily 16)  Omega-3 Krill Oil 300 Mg Caps (Krill Oil) .... Take 1 Cap Daily 17)  Colace 100 Mg Caps (Docusate Sodium) .... Two Tablets By Mouth in The Morning 18)  Furosemide 40 Mg Tabs (Furosemide) .... Take  One Tablet By Mouth Daily As Needed On Days When Wt Is Increased 3-4 Lbs 19)  Potassium Chloride Crys Cr 20 Meq Cr-Tabs (Potassium Chloride Crys Cr) .... Take 2 Tablets By Mouth Daily 20)  Aspir-Low 81 Mg Tbec (Aspirin) .... Take 1 Tab Daily 21)  Nitrolingual 0.4 Mg/spray Soln (Nitroglycerin) .... One Spray Under Tongue Every 5 Minutes As Needed For Chest Pain---May Repeat Times Three 22)  Aleve 220 Mg Tabs (Naproxen Sodium) .Marland Kitchen.. 1-2 Tablets Two Times A Day As Needed Arthiritc Pain 23)  Crestor 10 Mg  Tabs (Rosuvastatin Calcium) .... Take One Tablet By Mouth Daily.  Allergies (verified): 1)  ! Cleocin 2)  ! Penicillin 3)  ! Codeine 4)  ! Erythromycin 5)  ! Keflex 6)  ! Lipitor 7)  ! Zocor 8)  ! * Avandia 9)  ! * Metformin  Past History:  PMH, FH, and Social History reviewed and updated.  Past Medical History: Exertional chest discomfort and dyspnea; PND AORTIC STENOSIS (ICD-424.1); bicuspid aortic valve; mean gradient of 20 mmHg in 2/10; aortic valve           replacement surgery(19-mm Edwards pericardial valve) with a redo coronary artery bypass graft procedure      in 07/2009; postoperative Dressler's syndrome CORONARY ARTERY DISEASE (ICD-414.00)-status post RCA saphenous vein graft in 06/1999;       multiple preceding PCIs with restenosis; total obstruction of RCA in 2006 with patent graft History of CHF; recurred following AVR/CABG surgery DYSLIPIDEMIA (ICD-272.4) HYPERTENSION 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 Treated for gastroesophageal reflux disease in the past; ? 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  Vital Signs:  Patient profile:   60 year old female Weight:      123 pounds BMI:     21.87 Pulse rate:   78 / minute BP sitting:   123 / 59  (right arm)  Vitals Entered By: Doretha Sou, CNA (April 11, 2010 11:23 AM)  Physical Exam  General:  Trim; well developed; no acute distress: Weight-123, 3 pounds more than at her last visit   Neck-No JVD; no carotid bruits, but  transmitted murmur present Lungs-No tachypnea, no rales; no rhonchi; no wheezes; Cardiovascular-normal PMI; normal S1 and S2; grade 2/6 basilar early systolic ejection murmur at the cardiac base Abdomen-BS normal; soft and non-tender without masses or organomegaly:  Neurologic-Normal cranial nerves; symmetric strength and tone:  Skin-Warm, no significant lesions: Extremities-Nl distal pulses; no edema:   Impression & Recommendations:  Problem # 1:  CONGESTIVE HEART FAILURE WITH NORMAL EF (ICD-428.32) Ms. Gupta is experiencing symptoms that have clearly reflected congestive heart failure in the past when we had been able to examine her at their onset.   It appears that she is extremely sensitive to volume, developing symptoms with minimal changes in weight.  She has not been able to relate even a small increase in weight to the occurrence of these problems, which she notes approximately once every week or two.  She'll continue to use additional furosemide as needed.  I also suggested that she try sublingual nitrates, which should provide extremely rapid  improvement.  She will come to the emergency department for significant symptoms that do not improve with these measures.  Problem # 2:  HYPERTENSION (ICD-401.1) Blood pressure control has been excellent.  Problem # 3:  HYPERLIPIDEMIA (IEP-329.4) Dr. Gerarda Fraction has been managing lipid-lowering medication and following serum lipid profiles.  Recent values are superb as noted below.  CHOL: 127 (03/22/2010)   LDL: 61 (03/22/2010)   HDL: 36 (03/22/2010)   TG: 151 (03/22/2010)  Problem # 4:  ARTHRALGIA (ICD-719.40) Patient is experiencing significant arthralgias, particularly in the hands and knees.  These were initially quite limiting, but have improved.  She is scheduled to see a rheumatologist.  I suggested she try naproxen 200-400 mg q.d. or b.i.d. p.r.n. in the interim.  I will reassess this very nice woman in 3 months.  A chemistry profile, BNP  level, and CBC will be obtained in 6 weeks.  Patient Instructions: 1)  Your physician recommends that you schedule a follow-up appointment in: 3 months 2)  Your physician recommends that you return for lab work in:6 weeks 3)  Your physician has recommended you make the following change in your medication:  nitroglycerin spray 1 spray as needed every 5 minutes x3 for chest pain, aleve as needed for arthritic pain Prescriptions: NITROLINGUAL 0.4 MG/SPRAY SOLN (NITROGLYCERIN) One spray under tongue every 5 minutes as needed for chest pain---may repeat times three  #1 x 3   Entered by:   Tye Savoy RN   Authorized by:   Yehuda Savannah, MD, [facility]   Signed by:   Tye Savoy RN on 04/11/2010   Method used:   Electronically to        [facility] (retail)       [address]       [place], [place]  [address]       Ph: [phone] or [phone]       Fax: [phone]   RxID:   [id number]

## 2010-08-22 NOTE — Letter (Signed)
Summary: progress notes  progress notes   Imported By: [place] Crane 09/26/2009 12:14:17  _____________________________________________________________________  External Attachment:    Type:   Image     Comment:   External Document

## 2010-08-22 NOTE — Letter (Signed)
Summary: CARDIAC DISCHARGE SUMMARY 01-16-10   CARDIAC DISCHARGE SUMMARY 01-16-10   Imported By: [place] Crane 02/03/2010 12:52:42  _____________________________________________________________________  External Attachment:    Type:   Image     Comment:   External Document

## 2010-08-22 NOTE — Letter (Signed)
Summary: Handout Printed  Printed Handout:  - Diet - Potassium Content of Foods

## 2010-08-22 NOTE — Letter (Signed)
Summary: [facility], general practice at [place]. [address], [place] [address]   Phone: [phone]  Fax: [phone]      March 03, 2010 MRN: [id number]   [name] [place], [name]   Dear Ms. Kovacevic,  Your test ordered by Rande Lawman has been reviewed by your physician (or physician assistant) and was found to be normal or stable. Your physician (or physician assistant) felt no changes were needed at this time.  ____ Echocardiogram  ____ Cardiac Stress Test  __x__ Lab Work  ____ Peripheral vascular study of arms, legs or neck  ____ CT scan or X-ray  ____ Lung or Breathing test  ____ Other:  No change in medical treatment at this time, per Dr. Lattie Haw.  Thank you, Tammy Baird Cancer RN    [name], MD, Leana Gamer.C.Renella Cunas, MD, F.A.C.C [name], MD, F.A.C.C Rozann Lesches, MD, F.A.C.C Jenkins Rouge, MD, Leana Gamer.C.C

## 2010-08-22 NOTE — Miscellaneous (Signed)
Summary: labs bmp,lipid,08/18/2009  Clinical Lists Changes  Observations: Added new observation of GFR: 10.6 mL/min (08/18/2009 13:56) Added new observation of CREATININE: 1.13 mg/dL (08/18/2009 13:56) Added new observation of BUN: 22 mg/dL (08/18/2009 13:56) Added new observation of BG RANDOM: 92 mg/dL (08/18/2009 13:56) Added new observation of CO2 PLSM/SER: 25 meq/L (08/18/2009 13:56) Added new observation of CL SERUM: 100 meq/L (08/18/2009 13:56) Added new observation of K SERUM: 4.4 meq/L (08/18/2009 13:56) Added new observation of NA: 136 meq/L (08/18/2009 13:56) Added new observation of LDL: 70 mg/dL (08/18/2009 13:56) Added new observation of HDL: 27 mg/dL (08/18/2009 13:56) Added new observation of TRIGLYC TOT: 168 mg/dL (08/18/2009 13:56) Added new observation of CHOLESTEROL: 131 mg/dL (08/18/2009 13:56)

## 2010-08-22 NOTE — Letter (Signed)
Summary: CARDIAC [facility] EVENT  CARDIAC REHAB EVENT   Imported By: [place] Crane 10/03/2009 13:37:58  _____________________________________________________________________  External Attachment:    Type:   Image     Comment:   External Document

## 2010-08-22 NOTE — Letter (Signed)
Summary: [facility], general practice at [place]. [address] [place] [address]   Phone: [phone]  Fax: [phone]     September 29, 2009 MRN: [id number]   [name] [place], [place]  [address]      YOUR LAB WORK IS DUE  October 13, 2009  Please go to [facility], located across the street from [facility] on the second floor.  Hours are Monday - Friday 7am until 7:30pm         Saturday 8am until 12noon    __  DO NOT EAT OR DRINK AFTER MIDNIGHT EVENING PRIOR TO LABWORK  _X_ YOUR LABWORK IS NOT FASTING --YOU MAY EAT PRIOR TO LABWORK

## 2010-08-22 NOTE — Assessment & Plan Note (Signed)
Summary: post hospital diarrhea/sheri    History of Present Illness Visit Type: Follow-up Visit Primary GI MD: [name] [facility] Primary Provider: [name], MD Requesting Provider: n/a Chief Complaint: Hosp f/u for diarrhea  History of Present Illness:    60 yo white woman I have seen for IBS and gastroparesis. she was recently admitted with diarrhea and though stool studies negative, had suspected toxin negative C. diff or antibiotic-associated diarrhea. she was treated with metronidazole.Now better. It was 1 week before she defecated after discharge. Is on Benefiber two times a day and Colace 2/day to avoid medication related constipation.  Also had to use magic mouthwash after valve replacement and developed reflux sxs and restarted Zegerid generic.Sleeping on a wedge due to orthopnea and thinks the GERD is beter. Is on this for about 2 minths now with success. She is asymtomatic. Fish oil at bedtime did upest her stomach some but changed to after dinner with success. She ?'s if she needs to take Zegerid 1 houre before or 2-3 hours after eating due to her bedtime snack wth DM. She is managing gastroparesis by eating frequent small meals.   GI Review of Systems      Denies abdominal pain, acid reflux, belching, bloating, chest pain, dysphagia with liquids, dysphagia with solids, heartburn, loss of appetite, nausea, vomiting, vomiting blood, weight loss, and  weight gain.        Denies anal fissure, black tarry stools, change in bowel habit, constipation, diarrhea, diverticulosis, fecal incontinence, heme positive stool, hemorrhoids, irritable bowel syndrome, jaundice, light color stool, liver problems, rectal bleeding, and  rectal pain.    Current Medications (verified): 1)  Tricor 145 Mg Tabs (Fenofibrate) .Marland Kitchen.. 1 Tablet By Mouth Once Daily 2)  Januvia 100 Mg Tabs (Sitagliptin Phosphate) .Marland Kitchen.. 1 Tablet By Mouth Once Daily 3)  Crestor 20 Mg Tabs (Rosuvastatin Calcium) ....  Take One Tablet By Mouth Daily. 4)  Zetia 10 Mg Tabs (Ezetimibe) .Marland Kitchen.. 1 Tablet By Mouth At Bedtime 5)  Apidra 100 Unit/ml Soln (Insulin Glulisine) .... 70 Units Daily 6)  Calcium 600/vitamin D 600-400 Mg-Unit Tabs (Calcium Carbonate-Vitamin D) .Marland Kitchen.. 1 Tablet By Mouth Two Times A Day 7)  Vitamin D 1000 Unit Tabs (Cholecalciferol) .Marland Kitchen.. 1 Tablet By Mouth Once Daily 8)  Multivitamins   Tabs (Multiple Vitamin) .... One Tablet By Mouth Two Times A Day 9)  Benefiber  Tabs (Wheat Dextrin) .... 2 By Mouth Once Daily 10)  Furosemide 40 Mg Tabs (Furosemide) .... Take 1 Tab By Mouth Once Daily 11)  Tums 500 Mg Chew (Calcium Carbonate Antacid) .... As Needed 12)  Nitroglycerin 0.4 Mg Subl (Nitroglycerin) .... One Tablet Under Tongue Every 5 Minutes As Needed For Chest Pain---May Repeat Times Three 13)  Alprazolam 0.5 Mg Tabs (Alprazolam) .... Take 1-1.5 Tablets By Mouth At Bedtime For Sleep 14)  Tramadol Hcl 50 Mg Tabs (Tramadol Hcl) .... Take As Needed For Pain 15)  Metoprolol Tartrate 25 Mg Tabs (Metoprolol Tartrate) .... Take 1 Tab Two Times A Day 16)  Vitamin C Cr 500 Mg Cr-Tabs (Ascorbic Acid) .... Take 1 Tab Daily 17)  Zegerid 40-1100 Mg Caps (Omeprazole-Sodium Bicarbonate) .Marland Kitchen.. 1 By Mouth Once Daily 18)  Vagifem 10 Mcg Tabs (Estradiol) .Marland Kitchen.. 1 Tab Vaginally At Bedtime For 2 Weeks 19)  Coq10 100 Mg Caps (Coenzyme Q10) .... Take 1 Tab Daily 20)  Omega-3 Krill Oil 300 Mg Caps (Krill Oil) .... Take 1 Cap Two Times A Day 21)  Colace 100 Mg Caps (  Docusate Sodium) .... Two Tablets By Mouth in The Morning  Allergies (verified): 1)  ! Cleocin 2)  ! Penicillin 3)  ! Codeine 4)  ! Erythromycin 5)  ! Keflex 6)  ! Lipitor 7)  ! Zocor 8)  ! * Avandia 9)  ! * Metformin  Past History:  Past Medical History: Reviewed history from 10/24/2009 and no changes required. 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 Treated for gastroesophageal reflux disease in the past; ? of GASTROPARESIS; IRRITABLE BOWEL SYNDROME H/O HODGKINS DISEASE (ICD-V10.72)  Past Surgical History: Reviewed history from 10/24/2009 and no changes required. 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  Family History: Reviewed history from 05/20/2008 and no changes required. No FH of Colon Cancer: Family History of Breast Cancer:Paternal half sister Lung cancer: Maternal half sister Family History of Ovarian Cancer:maternal half sister Family History of Diabetes: Father, maternal Uncle Family History of Heart Disease: Mother, Father, maternal/paternal Aunts and Uncles  Social History: Reviewed history from 01/19/2009 and no changes required. Patient has never smoked.  Alcohol Use - no Illicit Drug  Use - no Patient does not get regular exercise, has active lifestyle Full Time - elementary principal Married   Review of Systems       per HPI  Vital Signs:  Patient profile:   61 year old female Height:      63 inches Weight:      119 pounds BMI:     21.16 BSA:     1.55 Pulse rate:   80 / minute Pulse rhythm:   regular BP sitting:   132 / 60  (left arm) Cuff size:   regular  Vitals Entered By: Hope Pigeon CMA (October 31, 2009 9:24 AM)  Physical Exam  General:  slightly frail  Eyes:  anicteric Lungs:  Clear throughout to auscultation. Heart:  Regular rate and rhythm;  + systolic ejection murmur   Abdomen:  soft and non-tender insulin pump in place BS+   Impression & Recommendations:  Problem # 1:  DIARRHEA-PRESUMED INFECTIOUS (ICD-009.3) Assessment Improved this appears resolved and no further evaluation needed. she responded to metronidazole C. diff toxins were negative but she had been on multiple antibiotics prior  Problem # 2:  ? of GASTROPARESIS (ICD-536.3) Assessment: Improved doing well with diet modification  Problem # 3:  HEARTBURN (ICD-787.1) Assessment: New She was having problems when on magic mouthwash but s ok now off  that and sleeping on a wedge. She will taper off ad see.  Patient Instructions: 1)  Taper Zegerid-take one capsule every other day for two weeks then stop.  If this is not successful restart. 2)  Please schedule a follow-up appointment as needed.  3)  Copy sent to : [name], MD, [name], MD, Evlyn Clines, MD, Raynelle Bring, MD, Newton Pigg, MD, Lorne Skeens, MD 4)  The medication list was reviewed and reconciled.  All changed / newly prescribed medications were explained.  A complete medication list was provided to the patient / caregiver.

## 2010-08-22 NOTE — Letter (Signed)
Summary: Referral for [employer]  Referral for [employer]   Imported By: Alphonsus Sias [facility] 12/05/2009 14:16:54  _____________________________________________________________________  External Attachment:    Type:   Image     Comment:   External Document

## 2010-08-22 NOTE — Miscellaneous (Signed)
Summary: labs cmp,12/15/2009  Clinical Lists Changes  Observations: Added new observation of CALCIUM: 10.8 mg/dL (12/15/2009 12:40) Added new observation of ALBUMIN: 4.8 g/dL (12/15/2009 12:40) Added new observation of PROTEIN, TOT: 7.5 g/dL (12/15/2009 12:40) Added new observation of SGPT (ALT): 15 units/L (12/15/2009 12:40) Added new observation of SGOT (AST): 18 units/L (12/15/2009 12:40) Added new observation of ALK PHOS: 77 units/L (12/15/2009 12:40) Added new observation of CREATININE: 1.27 mg/dL (12/15/2009 12:40) Added new observation of BUN: 32 mg/dL (12/15/2009 12:40) Added new observation of BG RANDOM: 124 mg/dL (12/15/2009 12:40) Added new observation of CO2 PLSM/SER: 29 meq/L (12/15/2009 12:40) Added new observation of CL SERUM: 99 meq/L (12/15/2009 12:40) Added new observation of K SERUM: 3.5 meq/L (12/15/2009 12:40) Added new observation of NA: 141 meq/L (12/15/2009 12:40)

## 2010-08-22 NOTE — Assessment & Plan Note (Signed)
Summary: 1 wk f/u per checkout on 3/1/tg  Medications Added FUROSEMIDE 40 MG TABS (FUROSEMIDE) take 1 tab by mouth two times a day ALPRAZOLAM 0.5 MG TABS (ALPRAZOLAM) TAKE 1-1.5 TABLETS BY MOUTH AT BEDTIME FOR SLEEP NAPROXEN 500 MG TABS (NAPROXEN) take 1/2  tablet by mouth two times a day X 10 days      Allergies Added:   Referring Provider:  Endocrine-Dr. Legrand Como Altheimer Primary Provider:  [name], MD   History of Present Illness: Return visit at short interval for this very nice young woman who recently underwent aortic valve replacement surgery, initially without too much in the way of complications.  She has subsequently developed congestive heart failure on at least one occasion requiring adjustment of her diuretics.  Weight has decreased by 2 pounds at home since increasing furosemide from 40 to 80 mg per day  She continues to participate in cardiac rehabilitation without undue difficulty.  She does note dyspnea with moderate exertion and minimal chest tightness.  There is been no lightheadedness and certainly no syncope.   Control of diabetes has been lost.  Her dose of insulin delivered by her infusion pump has tripled, but blood glucose values remain elevated.  Echocardiogram  Procedure date:  09/02/2009  Findings:         - Left ventricle: The cavity size was normal. There was mild focal     basal hypertrophy of the septum. Systolic function was normal.      estimated ejection fraction was in the range of 60% to 65%.  no regional wall motion abnormalities.   - Ventricular septum: Septal motion showed abnormal function and     dyssynergy.      - Mitral valve: Calcified annulus. Mild regurgitation.   - Left atrium: mildly dilated.   - atrial septal aneurysm.   - Pulmonary arteries: PA peak pressure: 51m Hg (S).   - Pericardium, extracardiac: There was mild thickening posteriorly.     There was a left pleural effusion.   Aortic valve: A bioprosthesis was  present. Doppler:   VTI ratio of   LVOT to aortic valve: 0.4. Peak velocity ratio of LVOT to aortic   valve: 0.41.  Mean gradient: 113mHg (S). Peak gradient: 2440mg     Echocardiogram  Procedure date:  09/29/2009  Findings:      Sinus tachycardia First degree AV block Nonspecific ST-T wave abnormality Marked QT prolongation, substantially increased since previous tracing in 11/09.   Current Medications (verified): 1)  Tricor 145 Mg Tabs (Fenofibrate) ....Marland Kitchen1 Tablet By Mouth Once Daily 2)  Januvia 100 Mg Tabs (Sitagliptin Phosphate) ....Marland Kitchen1 Tablet By Mouth Once Daily 3)  Crestor 20 Mg Tabs (Rosuvastatin Calcium) .... Take One Tablet By Mouth Daily. 4)  Zetia 10 Mg Tabs (Ezetimibe) ....Marland Kitchen1 Tablet By Mouth At Bedtime 5)  Apidra 100 Unit/ml Soln (Insulin Glulisine) .... 70 Units Daily 6)  Calcium 600/vitamin D 600-400 Mg-Unit Tabs (Calcium Carbonate-Vitamin D) ....Marland Kitchen1 Tablet By Mouth Once Daily 7)  Vitamin D 1000 Unit Tabs (Cholecalciferol) ....Marland Kitchen1 Tablet By Mouth Once Daily 8)  Multivitamins   Tabs (Multiple Vitamin) ....Marland Kitchen1 Tablet By Mouth Two Times A Day 9)  Fish Oil 1000 Mg Caps (Omega-3 Fatty Acids) ....Marland Kitchen1 Tab Once Daily 10)  Aspirin 81 Mg Tbec (Aspirin) .... Take One Tablet By Mouth Daily 11)  Benefiber  Powd (Wheat Dextrin) .... 2 Tsp. Daily 12)  Furosemide 40 Mg Tabs (Furosemide) .... Take 1 Tab By  Mouth Two Times A Day 13)  Tums 500 Mg Chew (Calcium Carbonate Antacid) .... As Needed 14)  Nitroglycerin 0.4 Mg Subl (Nitroglycerin) .... One Tablet Under Tongue Every 5 Minutes As Needed For Chest Pain---May Repeat Times Three 15)  Alprazolam 0.5 Mg Tabs (Alprazolam) .... Take 1-1.5 Tablets By Mouth At Bedtime For Sleep 16)  Tramadol Hcl 50 Mg Tabs (Tramadol Hcl) .... Take As Needed For Pain 17)  Metoprolol Tartrate 25 Mg Tabs (Metoprolol Tartrate) .... Take 1 Tab Two Times A Day 18)  Vitamin C Cr 500 Mg Cr-Tabs (Ascorbic Acid) .... Take 1 Tab Daily 19)  Ferrous Sulfate 324 Mg Tabs  (Ferrous Sulfate) .... Take 1 Tab Daily D/c After Current Rx 20)  Naproxen 500 Mg Tabs (Naproxen) .... Take 1/2  Tablet By Mouth Two Times A Day X 10 Days  Allergies (verified): 1)  ! Cleocin 2)  ! Penicillin 3)  ! Codeine 4)  ! Erythromycin 5)  ! Keflex 6)  ! Lipitor 7)  ! Zocor 8)  ! * Avandia 9)  ! * Metformin  Past History:  PMH, FH, and Social History reviewed and updated.  Past Medical History: Exertional chest discomfort and dyspnea DYSLIPIDEMIA (ICD-272.4) ESSENTIAL HYPERTENSION AORTIC STENOSIS (ICD-424.1); bicuspid aortic valve; mean gradient of 20 mmHg in 2/10; aortic valve           replacement surgery(19-mm Edwards pericardial valve) with a redo coronary artery bypass graft procedure      in 07/2009; postoperative Dressler's syndrome CORONARY ARTERY DISEASE (ICD-414.00)-status post RCA saphenous vein graft in 06/2004;       multiple preceding PCIs with restenosis; total obstruction of RCA in 2006 with patent graft History of CHF; recurred following AVR/CABG surgery 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 Treated for gastroesophageal reflux disease in the past; ? 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  Review of Systems       See history of present  illness.  Vital Signs:  Patient profile:   60 year old female Weight:      122 pounds O2 Sat:      98 % Pulse rate:   108 / minute BP sitting:   117 / 72  (left arm)  Vitals Entered By: Jeani Hawking Via LPN (September 29, 4313 40:08 AM)  Physical Exam  General:    Trim, well developed; no acute distress: Neck-No JVD; faint transmitted murmur to carotids: Lungs-No tachypnea, no rales; no rhonchi; no wheezes; Cardiovascular-normal PMI; normal S; increased S2; grade 6-7/6 systolic ejection murmur at the cardiac base   [place]ax-well-healed median sternotomy   Abdomen-BS normal; soft and non-tender without masses or organomegaly:   Musculoskeletal-No deformities, no cyanosis or clubbing:   Neurologic-Normal cranial nerves; symmetric strength and tone:   Skin-Warm, no significant lesions:   Extremities-Nl distal pulses; no edema:   Impression & Recommendations:  Problem # 1:  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  Patient will taper Naprosyn as symptoms permit.  Problem # 2:  ESSENTIAL HYPERTENSION, BENIGN (ICD-401.1) Blood pressure control is adequate.  Problem # 3:  AORTIC STENOSIS (ICD-424.1) Bioprosthetic valve appears to be functioning normally.  This was verified on echocardiography 5 weeks postoperative.  Problem # 4:  CORONARY ARTERY DISEASE (ICD-414.00) Although she is experiencing some dyspnea on exertion and some chest discomfort, these symptoms have been chronic and probably do not represent myocardial ischemia.  No postoperative testing for the integrity of her single-vessel graft is warranted at the present time.  Problem # 5:  DIABETES MELLITUS, TYPE II (ICD-250.00) Return appointment with Dr. Eden Emms advised.  Problem # 6:  CONGESTIVE HEART FAILURE WITH NORMAL EF (ICD-428.32) Appears compensated with current diuretic dose, which will be continued.  Chemistry profile and BNP level will be obtained in 2 weeks.  Ms. Nesser is scheduled to return to work in  early April.  I will reassess her in 3 weeks to determine if this is feasible.  Other Orders: Future Orders: T-Comprehensive Metabolic Panel ([id number]) ... 10/13/2009 T-CBC w/Diff ([id number]) ... 10/13/2009 T-BNP  (B Natriuretic Peptide) [phone]) ... 10/13/2009 T-Magnesium [phone]) ... 10/13/2009  Patient Instructions: 1)  Your physician recommends that you schedule a follow-up appointment in: 3 WEEKS 2)  Your physician recommends that you return for lab work in: 2 WEEKS 3)  You have been referred to [facility] 4)  TAKE NAPROXEN ONCE A DAY IF NOT NEEDED TWICE A DAY

## 2010-08-22 NOTE — Letter (Signed)
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 [address], [place] [address]   Phone: [phone]  Fax: [phone]    11/17/2009  TO: [name] [name] IT MAY CONCERN   RE: [name] [name] [address]   The above named individual is under my medical care and may return to work immediately.  She is required to continue cardiac rehab 3 days a week.  If you have any further questions or need additional information, please call.     Sincerely,    Dr. [name]

## 2010-08-22 NOTE — Letter (Signed)
Summary: [facility], general practice at [place]. [address] [place] [address]   Phone: [phone]  Fax: [phone]      August 23, 2009 MRN: [id number]   [name] [name] [name] [name], [place]  [address]   Dear [name],  Your test ordered by Rande Lawman has been reviewed by your physician (or physician assistant) and was found to be normal or stable. Your physician (or physician assistant) felt no changes were needed at this time.  ____ Echocardiogram  ____ Cardiac Stress Test  __x__ Lab Work  ____ Peripheral vascular study of arms, legs or neck  ____ CT scan or X-ray  ____ Lung or Breathing test  ____ Other:  No change in medical treatment at this time, per Dr. Lattie Haw.  Thank you, Milton Streicher [facility] RN    [name], MD, Leana Gamer.C.Renella Cunas, MD, F.A.C.C [name], MD, F.A.C.C Rozann Lesches, MD, F.A.C.C Jenkins Rouge, MD, Leana Gamer.C.C

## 2010-08-22 NOTE — Letter (Signed)
Summary: [facility], general practice at [place]. [address], [place] [address]   Phone: [phone]  Fax: [phone]     September 14, 2009 MRN: [id number]   [name] [name] [place], [place]  [address]      YOUR LAB WORK IS DUE   ___________APRIL 6 , 2011______________________________  Please go to [facility], located across the street from [facility] on the second floor.  Hours are Monday - Friday 7am until 7:30pm         Saturday 8am until 12noon    _X_  DO NOT EAT OR DRINK AFTER MIDNIGHT EVENING PRIOR TO LABWORK  __ YOUR LABWORK IS NOT FASTING --YOU MAY EAT PRIOR TO LABWORK

## 2010-08-22 NOTE — Consult Note (Signed)
[facility]   Imported By: Sallee Provencal 09/20/2009 14:32:27  _____________________________________________________________________  External Attachment:    Type:   Image     Comment:   External Document

## 2010-08-22 NOTE — Progress Notes (Signed)
Summary: [facility]  [facility]   Imported By: Phillis Knack 04/28/2010 09:46:00  _____________________________________________________________________  External Attachment:    Type:   Image     Comment:   External Document

## 2010-08-22 NOTE — Letter (Signed)
Summary: [facility]  [facility]   Imported By: Phillis Knack 03/07/2010 15:16:04  _____________________________________________________________________  External Attachment:    Type:   Image     Comment:   External Document

## 2010-08-22 NOTE — Miscellaneous (Signed)
Summary: diovan refill  Clinical Lists Changes  Medications: Rx of DIOVAN 80 MG TABS (VALSARTAN) 1 tab once daily;  #30 x 6;  Signed;  Entered by: Tye Savoy RN;  Authorized by: Yehuda Savannah, MD, [facility];  Method used: Electronically to [facility] [address], [place], [place], [place]  [address], Ph: [phone] or [phone], Fax: [phone]    Prescriptions: DIOVAN 80 MG TABS (VALSARTAN) 1 tab once daily  #30 x 6   Entered by:   Tye Savoy RN   Authorized by:   Yehuda Savannah, MD, [facility]   Signed by:   Tye Savoy RN on 07/25/2009   Method used:   Electronically to        [facility] (retail)       [address]       [place], [place]  [address]       Ph: [phone] or [phone]       Fax: [phone]   RxID:   [id number]

## 2010-08-22 NOTE — Progress Notes (Signed)
Summary: Medication Question   Phone Note Call from Patient Call back at [phone]   Caller: Patient Reason for Call: Talk to Nurse Summary of Call: wants to know if it is okay to use an estrogen with all the heart meds she is taking/pls call pt on cell phone number that is listed above/tg Initial call taken by: Alphonsus Sias [facility],  September 30, 2009 1:38 PM  Follow-up for Phone Call        Yes she can take estrogen. Follow-up by: Jory Sims NP

## 2010-08-22 NOTE — Miscellaneous (Signed)
Summary: LABS BNP,CMP,CBCD,TSH,11/28/2009  Clinical Lists Changes  Observations: Added new observation of CALCIUM: 10.6 mg/dL (11/28/2009 17:12) Added new observation of ALBUMIN: 4.7 g/dL (11/28/2009 17:12) Added new observation of PROTEIN, TOT: 7.4 g/dL (11/28/2009 17:12) Added new observation of SGPT (ALT): 16 units/L (11/28/2009 17:12) Added new observation of SGOT (AST): 23 units/L (11/28/2009 17:12) Added new observation of ALK PHOS: 62 units/L (11/28/2009 17:12) Added new observation of BILI DIRECT: 55 mg/dL (11/28/2009 17:12) Added new observation of GFR AA: >60 mL/min/1.44m (11/28/2009 17:12) Added new observation of CREATININE: 1.03 mg/dL (11/28/2009 17:12) Added new observation of BUN: 35 mg/dL (11/28/2009 17:12) Added new observation of BG RANDOM: 74 mg/dL (11/28/2009 17:12) Added new observation of CO2 PLSM/SER: 25 meq/L (11/28/2009 17:12) Added new observation of CL SERUM: 101 meq/L (11/28/2009 17:12) Added new observation of K SERUM: 4.2 meq/L (11/28/2009 17:12) Added new observation of NA: 141 meq/L (11/28/2009 17:12) Added new observation of PLATELETK/UL: 214 K/uL (11/28/2009 17:12) Added new observation of MCV: 88.0 fL (11/28/2009 17:12) Added new observation of HCT: 37.5 % (11/28/2009 17:12) Added new observation of HGB: 11.5 g/dL (11/28/2009 17:12) Added new observation of WBC COUNT: 11.6 10*3/microliter (11/28/2009 17:12) Added new observation of TSH: 3.883 microintl units/mL (11/28/2009 17:12) Added new observation of BNP: 115.1  (11/28/2009 17:12)

## 2010-08-22 NOTE — Progress Notes (Signed)
Summary: Bloody diarrhea   Phone Note Call from Patient Call back at cell [phone]   Call For: Dr [name] Reason for Call: Talk to Nurse Summary of Call: Bloody diarrhea wonders if she can be seen next week sometime.  Initial call taken by: Irwin Brakeman [facility],  October 07, 2009 2:55 PM  Follow-up for Phone Call        Patient  had diarrhea and rectal bleeding yesterday, she saw her primary care today and they advised her to come see [name].  Patient  will come in and see Nicoletta Ba PA Monday 04-12-10 9:30.  I have asked her to contact [facility] and have them fax her lab results from today Hct 32. She was advised by her primary care if she had an increase in bleeding she should go to the ER over the weekend Follow-up by: Barb Merino RN, CGRN,  October 07, 2009 3:17 PM

## 2010-08-22 NOTE — Miscellaneous (Signed)
Summary: LABS BMP,04/29/2009  Clinical Lists Changes  Observations: Added new observation of CALCIUM: 9.8 mg/dL (04/29/2009 14:18) Added new observation of CREATININE: 1.13 mg/dL (04/29/2009 14:18) Added new observation of BUN: 33 mg/dL (04/29/2009 14:18) Added new observation of BG RANDOM: 134 mg/dL (04/29/2009 14:18) Added new observation of CO2 PLSM/SER: 27 meq/L (04/29/2009 14:18) Added new observation of CL SERUM: 104 meq/L (04/29/2009 14:18) Added new observation of K SERUM: 3.7 meq/L (04/29/2009 14:18) Added new observation of NA: 140 meq/L (04/29/2009 14:18) Added new observation of INR: 1.0  (04/29/2009 14:18) Added new observation of PT PATIENT: 12.5 s (04/29/2009 14:18)

## 2010-08-22 NOTE — Letter (Signed)
Summary: [facility]   [facility]   Imported By: Sallee Provencal 09/20/2009 11:33:01  _____________________________________________________________________  External Attachment:    Type:   Image     Comment:   External Document

## 2010-08-22 NOTE — Letter (Signed)
Summary: Stress Echocardiogram Information Sheet  [facility] at [place]. [address], [place] [address]   Phone: [phone]  Fax: [phone]      May 11, 2010 MRN: [id number] light prior to the test.   Meredeth Ide  Doctor: Appointment Date: Appointment Time: Appointment Location: [facility]  Stress Echocardiogram Information Sheet    Instructions:   1. DO NOT  take yourJANUVIA, FUROSEMIDE,METORPOLOL,OR NITROGLYCERIN medicineTHE MORNING days before the test.  2. NOTHING TO EAT OR DRINK AFTER MIDNIGHT   3. Dress prepared to exercise.  4. DO NOT use ANY caffine or tobacco products 3 hours before appointment.  5. Report to the [facility] on the1st floor.  6. Please bring all current prescription medications.  7. If you have any questions, please call [phone]

## 2010-08-22 NOTE — Miscellaneous (Signed)
Summary: cbc,cmp,lipids,ua, tsh,A1c  per Dr. Elyse Hsu  Clinical Lists Changes  Observations: Added new observation of BACTERIA URN: trace (04/21/2010 11:57) Added new observation of RBCS MICRO U: 0-5 (04/21/2010 11:57) Added new observation of WBCS MICRO U: 10-15 (04/21/2010 11:57) Added new observation of CASTS URINE: neg (04/21/2010 11:57) Added new observation of WBC UR: 500/ul (04/21/2010 11:57) Added new observation of NITRITE URN: neg (04/21/2010 11:57) Added new observation of PROTEIN UR: neg (04/21/2010 11:57) Added new observation of BLOOD UR: neg (04/21/2010 11:57) Added new observation of GLUCOSE UA: normal (04/21/2010 11:57) Added new observation of [place] URINE: 7.0  (04/21/2010 11:57) Added new observation of SPEC GR URIN: 1.015  (04/21/2010 11:57) Added new observation of CALCIUM: 10.9 mg/dL (04/21/2010 11:57) Added new observation of ALBUMIN: 4.7 g/dL (04/21/2010 11:57) Added new observation of PROTEIN, TOT: 8.5 g/dL (04/21/2010 11:57) Added new observation of SGPT (ALT): 16 units/L (04/21/2010 11:57) Added new observation of SGOT (AST): 22 units/L (04/21/2010 11:57) Added new observation of ALK PHOS: 61 units/L (04/21/2010 11:57) Added new observation of BILI DIRECT: total bili  0.4 mg/dL (04/21/2010 11:57) Added new observation of CREATININE: 1.3 mg/dL (04/21/2010 11:57) Added new observation of BUN: 37 mg/dL (04/21/2010 11:57) Added new observation of BG RANDOM: 111 mg/dL (04/21/2010 11:57) Added new observation of CO2 PLSM/SER: 29 meq/L (04/21/2010 11:57) Added new observation of CL SERUM: 101 meq/L (04/21/2010 11:57) Added new observation of K SERUM: 3.9 meq/L (04/21/2010 11:57) Added new observation of NA: 141 meq/L (04/21/2010 11:57) Added new observation of LDL: 71 mg/dL (04/21/2010 11:57) Added new observation of HDL: 38 mg/dL (04/21/2010 11:57) Added new observation of TRIGLYC TOT: 180 mg/dL (04/21/2010 11:57) Added new observation of CHOLESTEROL: 145 mg/dL  (04/21/2010 11:57) Added new observation of PLATELETK/UL: 184 K/uL (04/21/2010 11:57) Added new observation of MCV: 87.3 fL (04/21/2010 11:57) Added new observation of HCT: 36.3 % (04/21/2010 11:57) Added new observation of HGB: 11.0 g/dL (04/21/2010 11:57) Added new observation of WBC COUNT: 6.8 10*3/microliter (04/21/2010 11:57) Added new observation of TSH: 1.75 microintl units/mL (04/21/2010 11:57) Added new observation of T4, FREE: 0.70 ng/dL (04/21/2010 11:57) Added new observation of HGBA1C: 7.4 % (04/21/2010 11:57) Added new observation of FERRITIN: 66 ng/mL (04/21/2010 11:57) Added new observation of IRON SATUR %: 17 % (04/21/2010 11:57) Added new observation of TIBC: 410 mcg/dL (04/21/2010 11:57) Added new observation of UIBC: 351 mcg/dL (04/21/2010 11:57) Added new observation of IRON: 59 mcg/dL (04/21/2010 11:57) Added new observation of FOLATE: >20.0 ng/mL (04/21/2010 11:57) Added new observation of B12: 815 pg/mL (04/21/2010 11:57)

## 2010-08-22 NOTE — Letter (Signed)
Summary: [facility]  [facility]   Imported By: Rise Patience 01/24/2010 11:50:47  _____________________________________________________________________  External Attachment:    Type:   Image     Comment:   External Document

## 2010-08-22 NOTE — Assessment & Plan Note (Signed)
Summary: 3 wk f/u per checkout on 09/28/09/tg  Medications Added CRESTOR 20 MG TABS (ROSUVASTATIN CALCIUM) Take one tablet by mouth daily. FUROSEMIDE 40 MG TABS (FUROSEMIDE) take 1 tab by mouth once daily COQ10 100 MG CAPS (COENZYME Q10) take 1 tab daily PREDNISONE 5 MG TABS (PREDNISONE) take as directed FLEXERIL 5 MG TABS (CYCLOBENZAPRINE HCL) take 1 tab two times a day OMEGA-3 KRILL OIL 300 MG CAPS (KRILL OIL) take 1 cap two times a day      Allergies Added:   Visit Type:  Follow-up Referring Provider:   Endocrine-Dr. Legrand Como Altheimer Primary Provider:  [name], MD   History of Present Illness: [name] [name] returns as scheduled for continued assessment and treatment of multiple issues following aortic valve replacement and CABG surgery.  Since her last visit in the office, she was admitted to [facility] with nausea, emesis, diarrhea and hematochezia.  C. difficile was negative.  Symptoms resolved quickly and were thought to represent a gastroenteritis, possibly viral.  Following that, she developed back discomfort.  She was seen by Dr. Caron Presume, who obtained a chest x-ray that was unremarkable except for postoperative changes and C-spine films that showed minimal degenerative disc disease at C5-6.  Her most recent laboratory studies were in the hospital in late March and revealed continuing anemia with a hemoglobin of 10.2 and a normal MCV, normal electrolytes with borderline hypokalemia and normal renal function.  Diabetic control has been quite variable, but a recent hemoglobin A1c level was good.  [name] [name] continues to adjust insulin flow via her pump and subcutaneous glucose sensor.  She reports a recent lipid profile showing increased triglycerides and decreased HDL, most likely related to suboptimal diabetic control.  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  She has no orthopnea nor PND.  She has  noticed no peripheral edema.  Weight has been stable despite her having reduced her dose of furosemide to 40 mg q.d.      EKG  Procedure date:  10/24/2009  Findings:      Rhythm Strip  Normal sinus rhythm at a rate of 80 bpm First degree AV block; PR-300 ms Borderline IVCD  CXR  Procedure date:  10/21/2009  Findings:      Prior median sternotomy and AVR Normal heart size Normal vascularity Clear lung fields No effusion  -  Date:  10/13/2009    WBC: 8.2    HGB: 10.2    HCT: 29.9    PLT: 185    MCV: 85    BG Random: 154    BUN: 15    Creatinine: 0.86    Sodium: 142    Potassium: 3.8    Chloride: 110    CO2 Total: 24    SGOT (AST): 25    SGPT (ALT): 19    Calcium: 9.6    Total Protein: 7.0    Albumin: 3.7   Current Medications (verified): 1)  Tricor 145 Mg Tabs (Fenofibrate) .Marland Kitchen.. 1 Tablet By Mouth Once Daily 2)  Januvia 100 Mg Tabs (Sitagliptin Phosphate) .Marland Kitchen.. 1 Tablet By Mouth Once Daily 3)  Crestor 20 Mg Tabs (Rosuvastatin Calcium) .... Take One Tablet By Mouth Daily. 4)  Zetia 10 Mg Tabs (Ezetimibe) .Marland Kitchen.. 1 Tablet By Mouth At Bedtime 5)  Apidra 100 Unit/ml Soln (Insulin Glulisine) .... 70 Units Daily 6)  Calcium 600/vitamin D 600-400 Mg-Unit Tabs (Calcium Carbonate-Vitamin D) .Marland Kitchen.. 1 Tablet  By Mouth Once Daily 7)  Vitamin D 1000 Unit Tabs (Cholecalciferol) .Marland Kitchen.. 1 Tablet By Mouth Once Daily 8)  Multivitamins   Tabs (Multiple Vitamin) .Marland Kitchen.. 1 Tablet By Mouth Two Times A Day 9)  Benefiber  Tabs (Wheat Dextrin) .... 2 By Mouth Once Daily 10)  Furosemide 40 Mg Tabs (Furosemide) .... Take 1 Tab By Mouth Once Daily 11)  Tums 500 Mg Chew (Calcium Carbonate Antacid) .... As Needed 12)  Nitroglycerin 0.4 Mg Subl (Nitroglycerin) .... One Tablet Under Tongue Every 5 Minutes As Needed For Chest Pain---May Repeat Times Three 13)  Alprazolam 0.5 Mg Tabs (Alprazolam) .... Take 1-1.5 Tablets By Mouth At Bedtime For Sleep 14)  Tramadol Hcl 50 Mg Tabs (Tramadol Hcl) ....  Take As Needed For Pain 15)  Metoprolol Tartrate 25 Mg Tabs (Metoprolol Tartrate) .... Take 1 Tab Two Times A Day 16)  Vitamin C Cr 500 Mg Cr-Tabs (Ascorbic Acid) .... Take 1 Tab Daily 17)  Zegerid 40-1100 Mg Caps (Omeprazole-Sodium Bicarbonate) .Marland Kitchen.. 1 By Mouth Once Daily 18)  Vagifem 10 Mcg Tabs (Estradiol) .Marland Kitchen.. 1 Tab Vaginally At Bedtime For 2 Weeks 19)  Coq10 100 Mg Caps (Coenzyme Q10) .... Take 1 Tab Daily 20)  Flexeril 5 Mg Tabs (Cyclobenzaprine Hcl) .... Take 1 Tab Two Times A Day 21)  Omega-3 Krill Oil 300 Mg Caps (Krill Oil) .... Take 1 Cap Two Times A Day  Allergies (verified): 1)  ! Cleocin 2)  ! Penicillin 3)  ! Codeine 4)  ! Erythromycin 5)  ! Keflex 6)  ! Lipitor 7)  ! Zocor 8)  ! * Avandia 9)  ! * Metformin  Past History:  PMH, FH, and Social History reviewed and updated.  Past Medical History: Exertional chest discomfort and dyspnea DYSLIPIDEMIA (ICD-272.4) ESSENTIAL HYPERTENSION AORTIC STENOSIS (ICD-424.1); bicuspid aortic valve; mean gradient of 20 mmHg in 2/10; aortic valve           replacement surgery(19-mm Edwards pericardial valve) with a redo coronary artery bypass graft procedure      in 07/2009; postoperative Dressler's syndrome CORONARY ARTERY DISEASE (ICD-414.00)-status post RCA saphenous vein graft in 06/1999;       multiple preceding PCIs with restenosis; total obstruction of RCA in 2006 with patent graft History of CHF; recurred following AVR/CABG surgery 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 Treated for gastroesophageal reflux disease in the past; ? 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  Review of Systems       See history of present illness.  Vital Signs:  Patient profile:   60 year old female Weight:      121 pounds Pulse rate:   79 / minute BP sitting:   141 / 59  (right arm)  Vitals Entered By: Doretha Sou, CNA (October 24, 2009 11:13 AM)  Physical Exam  General:    Thin; well developed; no acute distress: Weight-121; 1  pound more than at her last visit.   Neck-No JVD; no carotid bruits, but transmitted murmur present Lungs-No tachypnea, no rales; no rhonchi; no wheezes: Cardiovascular-normal PMI; normal S1 and S2; grade 2/6 basilar systolic ejection murmur. Abdomen-BS normal; soft and non-tender without masses or organomegaly:  Musculoskeletal-No deformities, no cyanosis or clubbing: Neurologic-Normal cranial nerves; symmetric strength and tone:  Skin-Warm, no significant lesions: Extremities-Nl distal pulses; no edema:     Impression & Recommendations:  Problem # 1:  ESSENTIAL HYPERTENSION, BENIGN (ICD-401.1) Blood pressure control is acceptable with current medications, which will be continued.  Problem # 2:  CONGESTIVE HEART FAILURE WITH NORMAL EF (ICD-428.32) Compensated at present; I agree with reduction of her dose of diuretic and careful monitoring of her weight.  She will continue to participate in cardiac rehabilitation, which will provide additional monitoring.  Problem # 3:  AORTIC STENOSIS (ICD-424.1) She has a small prosthetic valve, and her exam is consistent with that.  I certainly hope that she will not have symptoms of  patient-prosthetic valve mismatch.  I agree with her plan to devote herself to rehabilitation over the next month and potentially to return to work at the beginning of May.  A letter for another month out of work was provided to her.  I will see her again just before the end of April.  Other Orders: Future Orders: T-Comprehensive Metabolic Panel ([id number]) ... 11/16/2009 T-CBC w/Diff ([id number]) ... 11/16/2009  Patient Instructions: 1)  Your physician recommends that you schedule a follow-up appointment in: Before 11-21-09 2)  Your physician recommends that you return for lab work on: 11-16-09 3)  Increase Potassium in your diet, please read handout given to you today.  Prescriptions: CRESTOR 20 MG TABS (ROSUVASTATIN CALCIUM) Take one tablet by mouth daily.  #30 x 6   Entered by:   Jeani Hawking Via LPN   Authorized by:   Yehuda Savannah, MD, [facility]   Signed by:   Jeani Hawking Via LPN on 43/15/4008   Method used:   Electronically to        [facility] (retail)       [address]       [place], [place]  [address]       Ph: [phone] or [phone]       Fax: [phone]   RxID:   [id number] FUROSEMIDE 40 MG TABS (FUROSEMIDE) take 1 tab by mouth once daily  #30 x 6   Entered by:   Jeani Hawking Via LPN   Authorized by:   Yehuda Savannah, MD, [facility]   Signed by:   Jeani Hawking Via LPN on 92/42/6834   Method used:   Electronically to        [facility] (retail)       [address]       [place], [place]  [address]       Ph: [phone] or [phone]       Fax: [phone]   RxID:   [id number]

## 2010-08-22 NOTE — Letter (Signed)
Summary: [facility], general practice at [facility]. [address] [place] [address] [place] [address]   Phone: [phone]  Fax: [phone]      September 28, 2009 MRN: [id number]   KENYOTTA DORFMAN [place], [place]  [address]   Dear Ms. Paget,  Your test ordered by Rande Lawman has been reviewed by your physician (or physician assistant) and was found to be normal or stable. Your physician (or physician assistant) felt no changes were needed at this time.  ____ Echocardiogram  ____ Cardiac Stress Test  __x__ Lab Work  ____ Peripheral vascular study of arms, legs or neck  ____ CT scan or X-ray  ____ Lung or Breathing test  ____ Other:  No change in medical treatment at this time, per Hershal Coria.  Enclosed is a copy of your labwork for your records.  Thank you, Ireoluwa Grant Baird Cancer RN    [name], MD, [facility] Gamer.C.Renella Cunas, MD, F.A.C.C [name], MD, F.A.C.C Rozann Lesches, MD, F.A.C.C Jenkins Rouge, MD, [facility] Gamer.C.C

## 2010-08-22 NOTE — Letter (Signed)
Summary: [facility]  [facility]   Imported By: Jamelle Haring 07/25/2009 12:27:09  _____________________________________________________________________  External Attachment:    Type:   Image     Comment:   External Document

## 2010-08-22 NOTE — Miscellaneous (Signed)
Summary: labs cbcd,cmp,11/16/2009  Clinical Lists Changes  Observations: Added new observation of CALCIUM: 10.3 mg/dL (11/16/2009 8:35) Added new observation of ALBUMIN: 4.4 g/dL (11/16/2009 8:35) Added new observation of PROTEIN, TOT: 7.3 g/dL (11/16/2009 8:35) Added new observation of SGPT (ALT): 20 units/L (11/16/2009 8:35) Added new observation of SGOT (AST): 22 units/L (11/16/2009 8:35) Added new observation of ALK PHOS: 82 units/L (11/16/2009 8:35) Added new observation of CREATININE: 1.21 mg/dL (11/16/2009 8:35) Added new observation of BUN: 31 mg/dL (11/16/2009 8:35) Added new observation of BG RANDOM: 101 mg/dL (11/16/2009 8:35) Added new observation of CO2 PLSM/SER: 26 meq/L (11/16/2009 8:35) Added new observation of CL SERUM: 104 meq/L (11/16/2009 8:35) Added new observation of K SERUM: 3.7 meq/L (11/16/2009 8:35) Added new observation of NA: 142 meq/L (11/16/2009 8:35) Added new observation of PLATELETK/UL: 183 K/uL (11/16/2009 8:35) Added new observation of MCV: 88 fL (11/16/2009 8:35) Added new observation of HCT: 35.1 % (11/16/2009 8:35) Added new observation of HGB: 10.8 g/dL (11/16/2009 8:35) Added new observation of WBC COUNT: 8.4 10*3/microliter (11/16/2009 8:35)

## 2010-08-22 NOTE — Progress Notes (Signed)
Summary: Medication and Lab Questions  Medications Added CRESTOR 20 MG TABS (ROSUVASTATIN CALCIUM) Take1/2  tablet by mouth daily.       Phone Note Call from Patient Call back at [phone]   Caller: Patient Reason for Call: Talk to Nurse Summary of Call: pt states that per Dr.Golding she has been cutting her Crestor from 19m to 255mdue to joint pain / pt states that since then she has felt 80% better / wants to know if she can have her lipids checked / she is also needing refill on Crestor / wants to know if she needs to get refill on the 4038mr if she can just get 7m9mtg Initial call taken by: TerrAlphonsus Sias,[facility], 2011 10:01 AM  Follow-up for Phone Call        S: pt has been taking crestor 7mg19mly, due to increased leg and joint pain, per Dr. GoldiHilma Favorsast ov 01/11/10, no changes A:  pt out of crestor wants lipid profile to see if she needs the 20's or 40's  R: pt to have cmp and lipids in am, will send in rx tomorrow for correct dose of crestor for pt's cholesterol level  Follow-up by: TammyTye Savoy March 21, 2010 11:29 AM  Additional Follow-up for Phone Call Additional follow up Details #1::        Medication will be adjusted as appropriate once lipid profile results are available.  Lipid profile is excellent, and she can continue current therapy.  Purchasing 40 mg tablets and split in half should be more cost-effective.    RoberJacqulyn Ducking.  Additional Follow-up by: RoberYehuda Savannah FACC,[facility], 2011 10:21 AM    Additional Follow-up for Phone Call Additional follow up Details #2::    A: lipid profile 03/22/10 results: ests: (2) Lipid Profile (22930)   Cholesterol               127 mg/dL                   0-200     ATP III Classification:           < 200        mg/dL        Desirable          200 - 239     mg/dL        Borderline High          >= 240        mg/dL        High         Triglyceride         [H]  151 mg/dL                    <150   HDL Cholesterol      [L]  36 mg/dL                    >39   Total Chol/HDL Ratio      3.5 Ratio  VLDL Cholesterol (Calc)                             30 mg/dL                    0-40  LDL Cholesterol (Calc)  61 mg/dL                    0-99           Total Cholesterol/HDL Ratio:CHD Risk R: Do you want pt to take crestor 40's or 20's per this phone and pt symptoms Follow-up by: Tye Savoy RN,  April 06, 2010 9:19 AM  New/Updated Medications: CRESTOR 20 MG TABS (ROSUVASTATIN CALCIUM) Take1/2  tablet by mouth daily.  I spoke with pt, scheduled for appt 04/11/10, will discuss with Dr. Lattie Haw at [facility]  April 10, 2010 10:53 AM

## 2010-08-22 NOTE — Letter (Signed)
Summary: Work Herbalist at [facility]. [address] [place] [address]   Phone: [phone]  Fax: [phone]     October 24, 2009    [name] [name]   The above named patient had a medical visit today at: 11:30am. Patient is unable to return to work at this time and will be out of work until Nov 21, 2009.  Please take this into consideration when reviewing the time away from work/school.      Sincerely yours,  Press photographer

## 2010-08-22 NOTE — Progress Notes (Signed)
Summary: LAB RESULTS   Phone Note Call from Patient Call back at Home Phone [phone]   Caller: PT Reason for Call: Lab or Test Results Summary of Call: SHE WOULD LIKE LAB RESULTS FROM LAST WEEK. Initial call taken by: Nevada Crane,  March 03, 2010 1:45 PM  Follow-up for Phone Call        results discussed with pt, letter mailed to pt Follow-up by: Tye Savoy RN,  March 03, 2010 4:35 PM

## 2010-08-22 NOTE — Letter (Signed)
Summary: [facility] Work Doctor, general practice at [facility]. [address] [place] [address]   Phone: [phone]  Fax: [phone]     November 17, 2009 MRN: [id number]   [name] [name] [place], [place]  [address]      YOUR LAB WORK IS DUE  Dec 01, 2009 _________________________________________  Please go to [facility], located across the street from [facility] on the second floor.  Hours are Monday - Friday 7am until 7:30pm         Saturday 8am until 12noon    __  DO NOT EAT OR DRINK AFTER MIDNIGHT EVENING PRIOR TO LABWORK  _X_ YOUR LABWORK IS NOT FASTING --YOU MAY EAT PRIOR TO LABWORK

## 2010-08-22 NOTE — Miscellaneous (Signed)
Summary: medication update  Clinical Lists Changes  Medications: Added new medication of ZOFRAN 4 MG TABS (ONDANSETRON HCL) take 1 to 2 tablets by mouth every 6 hours as needed for nausea - Signed Rx of ZOFRAN 4 MG TABS (ONDANSETRON HCL) take 1 to 2 tablets by mouth every 6 hours as needed for nausea;  #30 x 1;  Signed;  Entered by: Jeani Hawking Via LPN;  Authorized by: Jory Sims, NP;  Method used: Electronically to [facility] [address] [place], [place], [place]  [address], Ph: [phone] or [phone], Fax: [phone]    Prescriptions: ZOFRAN 4 MG TABS (ONDANSETRON HCL) take 1 to 2 tablets by mouth every 6 hours as needed for nausea  #30 x 1   Entered by:   Jeani Hawking Via LPN   Authorized by:   Jory Sims, NP   Signed by:   Jeani Hawking Via LPN on 20/25/4270   Method used:   Electronically to        [facility] (retail)       [address]       [place], [place]  [address]       Ph: [phone] or [phone]       Fax: [phone]   RxID:   [id number]

## 2010-08-22 NOTE — Letter (Signed)
Summary: DR Elyse Hsu 01-12-10 OFFICE NOTE  DR Elyse Hsu 01-12-10 OFFICE NOTE   Imported By: Nevada Crane 02/03/2010 12:51:48  _____________________________________________________________________  External Attachment:    Type:   Image     Comment:   External Document

## 2010-08-22 NOTE — Progress Notes (Signed)
Summary: questions    Phone Note Call from Patient Call back at Home Phone [phone]   Caller: pt Reason for Call: Talk to Nurse Summary of Call: [facility] and wants to know when she can go back to cardiac rehab. Plus she has appointment for teeth cleaning for next monday but she will stil be on flagyl will that be enough antibiotic for teeth cleaning.  Initial call taken by: Nevada Crane,  October 14, 2009 9:32 AM  Follow-up for Phone Call        She can resume Rehabilitation as soon as she feels up to it.  She should be treated with the same antibiotic she usually receives for dental work in addition to the antibiotic she is currently taking. [name], M.D.   Follow-up by: Yehuda Savannah, MD, [facility],  October 14, 2009 11:04 AM  Additional Follow-up for Phone Call Additional follow up Details #1::        pt made aware Additional Follow-up by: Tye Savoy RN,  October 14, 2009 11:25 AM

## 2010-08-22 NOTE — Miscellaneous (Signed)
Summary: LABS CMP,LIPIDS,03/22/2010  Clinical Lists Changes  Observations: Added new observation of CALCIUM: 10.1 mg/dL (03/22/2010 16:09) Added new observation of ALBUMIN: 4.8 g/dL (03/22/2010 16:09) Added new observation of PROTEIN, TOT: 7.6 g/dL (03/22/2010 16:09) Added new observation of SGPT (ALT): 15 units/L (03/22/2010 16:09) Added new observation of SGOT (AST): 21 units/L (03/22/2010 16:09) Added new observation of ALK PHOS: 53 units/L (03/22/2010 16:09) Added new observation of CREATININE: 1.30 mg/dL (03/22/2010 16:09) Added new observation of BUN: 38 mg/dL (03/22/2010 16:09) Added new observation of BG RANDOM: 135 mg/dL (03/22/2010 16:09) Added new observation of CO2 PLSM/SER: 26 meq/L (03/22/2010 16:09) Added new observation of CL SERUM: 104 meq/L (03/22/2010 16:09) Added new observation of K SERUM: 4.3 meq/L (03/22/2010 16:09) Added new observation of NA: 142 meq/L (03/22/2010 16:09) Added new observation of LDL: 61 mg/dL (03/22/2010 16:09) Added new observation of HDL: 36 mg/dL (03/22/2010 16:09) Added new observation of TRIGLYC TOT: 151 mg/dL (03/22/2010 16:09) Added new observation of CHOLESTEROL: 127 mg/dL (03/22/2010 16:09)

## 2010-08-22 NOTE — Progress Notes (Signed)
Summary: Schedule REV   Phone Note Outgoing Call Call back at [facility] Phone [phone]   Call placed by: Bernita Buffy CMA Deborra Medina),  October 07, 2009 9:52 AM Call placed to: Patient Summary of Call: No Answer patient needs follow up REV Initial call taken by: Bernita Buffy CMA Deborra Medina),  October 07, 2009 9:52 AM

## 2010-08-22 NOTE — Assessment & Plan Note (Signed)
Summary: 1 MTH F/U PER CHECKOUT ON 11/17/09/TG  Medications Added FUROSEMIDE 80 MG TABS (FUROSEMIDE) take 1 tablet by mouth by mouth once daily OMEGA-3 KRILL OIL 300 MG CAPS (KRILL OIL) take 1 cap DAILY DEXILANT 60 MG CPDR (DEXLANSOPRAZOLE) TAKE 1 TAB DAILY SUCRALFATE 1 GM/10ML SUSP (SUCRALFATE) USE QID      Allergies Added:   Visit Type:  Follow-up Referring Provider:  Dr. Elyse Hsu Primary Provider:  [name], MD   History of Present Illness: [name] is seen at her request 2 weeks prior to a scheduled visit due to possible congestive heart failure.  She has returned to full-time employment, which she is generally tolerating well.  She has had a few bad days characterized by excessive fatigue, but has not missed any work.  In the mornings, she notes a "gurgling" in her lungs associated with dyspnea.  There is no wheezing, sputum production nor cough, but she does sense the urge to cough.  She typically arises from bed and takes her daily dose of furosemide with resolution of her symptoms.  She sleeps on a wedge at night, has had stable weight and has noted no ankle edema.  EKG  Procedure date:  12/08/2009  Findings:      Normal sinus rhythm First degree AV block with a PR interval of 280 ms Left atrial abnormality Slightly delayed R-wave progression Prominent QRS voltage without definite criteria for LVH Borderline QT prolongation   Current Medications (verified): 1)  Tricor 145 Mg Tabs (Fenofibrate) .Marland Kitchen.. 1 Tablet By Mouth Once Daily 2)  Januvia 100 Mg Tabs (Sitagliptin Phosphate) .Marland Kitchen.. 1 Tablet By Mouth Once Daily 3)  Crestor 20 Mg Tabs (Rosuvastatin Calcium) .... Take One Tablet By Mouth Daily. 4)  Zetia 10 Mg Tabs (Ezetimibe) .Marland Kitchen.. 1 Tablet By Mouth At Bedtime 5)  Apidra 100 Unit/ml Soln (Insulin Glulisine) .Marland KitchenMarland KitchenMarland Kitchen 175 Units Daily 6)  Calcium 600/vitamin D 600-400 Mg-Unit Tabs (Calcium Carbonate-Vitamin D) .Marland Kitchen.. 1 Tablet By Mouth Two Times A Day 7)  Vitamin D  1000 Unit Tabs (Cholecalciferol) .Marland Kitchen.. 1 Tablet By Mouth Once Daily 8)  Multivitamins   Tabs (Multiple Vitamin) .... One Tablet By Mouth Two Times A Day 9)  Benefiber  Tabs (Wheat Dextrin) .... 2 By Mouth Once Daily 10)  Furosemide 80 Mg Tabs (Furosemide) .... Take 1 Tablet By Mouth By Mouth Once Daily 11)  Tums 500 Mg Chew (Calcium Carbonate Antacid) .... As Needed 12)  Nitroglycerin 0.4 Mg Subl (Nitroglycerin) .... One Tablet Under Tongue Every 5 Minutes As Needed For Chest Pain---May Repeat Times Three 13)  Alprazolam 0.5 Mg Tabs (Alprazolam) .... Take 1-1.5 Tablets By Mouth At Bedtime For Sleep 14)  Tramadol Hcl 50 Mg Tabs (Tramadol Hcl) .... Take As Needed For Pain 15)  Metoprolol Tartrate 25 Mg Tabs (Metoprolol Tartrate) .... Take 1 Tab Two Times A Day 16)  Vitamin C Cr 500 Mg Cr-Tabs (Ascorbic Acid) .... Take 1 Tab Daily 17)  Zegerid 40-1100 Mg Caps (Omeprazole-Sodium Bicarbonate) .Marland Kitchen.. 1 By Mouth Once Daily 18)  Coq10 100 Mg Caps (Coenzyme Q10) .... Take 1 Tab Daily 19)  Omega-3 Krill Oil 300 Mg Caps (Krill Oil) .... Take 1 Cap Daily 20)  Colace 100 Mg Caps (Docusate Sodium) .... Two Tablets By Mouth in The Morning 21)  Furosemide 40 Mg Tabs (Furosemide) .... Take One Tablet By Mouth Daily As Needed On Days When Wt Is Increased 3-4 Lbs 22)  Potassium Chloride Crys Cr 20 Meq Cr-Tabs (Potassium Chloride Crys Cr) .Marland KitchenMarland KitchenMarland Kitchen  Take 1 Tablet By Mouth Once A Day 23)  Dexilant 60 Mg Cpdr (Dexlansoprazole) .... Take 1 Tab Daily 24)  Sucralfate 1 Gm/59m Susp (Sucralfate) .... Use Qid  Allergies (verified): 1)  ! Cleocin 2)  ! Penicillin 3)  ! Codeine 4)  ! Erythromycin 5)  ! Keflex 6)  ! Lipitor 7)  ! Zocor 8)  ! * Avandia 9)  ! * Metformin  Past History:  PMH, FH, and Social History reviewed and updated.  Review of Systems       See history of present illness  Vital Signs:  Patient profile:   60year old female Weight:      121 pounds Pulse rate:   92 / minute BP sitting:   109 / 68   (right arm)  Vitals Entered By: SDoretha Sou CNA (Dec 08, 2009 3:13 PM)  Physical Exam  General:  Trim; well developed; no acute distress: Weight-12;  pound less than at her last visit.   Neck-No JVD; no carotid bruits, but transmitted murmur present Lungs-No tachypnea, no rales; no rhonchi; no wheezes: Cardiovascular-normal PMI; normal S1 and S2; grade 2/6 basilar early systolic ejection murmur. Abdomen-BS normal; soft and non-tender without masses or organomegaly:  Musculoskeletal-No deformities, no cyanosis or clubbing: Neurologic-Normal cranial nerves; symmetric strength and tone:  Skin-Warm, no significant lesions: Extremities-Nl distal pulses; no edema:   Impression & Recommendations:  Problem # 1:  CONGESTIVE HEART FAILURE WITH NORMAL EF (ICD-428.32) BNP level measured after her previous office visit was 115.  Chest x-ray showed no evidence for congestive heart failure.  Weight has been stable.  There is no compelling evidence for congestive heart failure; however, symptoms are identical to previous episodes of CHF.  Patient tends to be a reliable observer of her physiologic status.  Furosemide will be increased to 80 mg q.d. with monitoring of electrolytes and renal function.  I will plan to see this nice woman again in 2 weeks as previously scheduled.  Other Orders: Future Orders: T-Comprehensive Metabolic Panel ([id number] ... 12/15/2009  Patient Instructions: 1)  Your physician recommends that you schedule a follow-up appointment in: as scheduled 2)  Your physician recommends that you return for lab work in: 1 week 3)  Your physician has recommended you make the following change in your medication: increase Lasix to 849mby mouth once daily  Prescriptions: FUROSEMIDE 80 MG TABS (FUROSEMIDE) take 1 tablet by mouth by mouth once daily  #30 x 6   Entered by:   LyJeani Hawkingia LPN   Authorized by:   RoYehuda SavannahMD, FAVa [facility] Signed by:   LyJeani Hawkingia LPN on 0551/08/[id number]  Method used:   Electronically to        [facility]       [address]     [place]  [address]     Ph: [phone]     Fax: [phone] RxID:   [id number]

## 2010-08-22 NOTE — Miscellaneous (Signed)
  Clinical Lists Changes  Observations: Added new observation of CARDIO HPI: [name] came to the office today to report an incident in cardiac rehabilitation.  She developed dizziness and weakness while pedaling a stationary bicycle at a level she usually tolerates well.  Heart rate and blood pressure were apparently normal.  She reports that she has not felt well for the past few days and has gained 5 pounds.  She has been taking naproxen sodium 500 mg b.i.d. for pleuritic chest pain, which is improved.  I explained that moderate doses of nonsteroidals could cause fluid retention.  She will increase furosemide to 40 mg b.i.d. until her weight returns to baseline.  She will decrease naproxen to 200 or 250 mg b.i.d.  She is scheduled for repeat lab in 3 days and a return office visit within the week.   (09/23/2009 12:17) Added new observation of REFERRING MD: Tia Masker Altheimer (09/23/2009 12:17) Added new observation of PRIMARY MD: [name], MD (09/23/2009 12:17)      Referring Provider:  Endocrine-Dr. Legrand Como Altheimer Primary Provider:  [name], MD   History of Present Illness: [name] [name] came to the office today to report an incident in cardiac rehabilitation.  She developed dizziness and weakness while pedaling a stationary bicycle at a level she usually tolerates well.  Heart rate and blood pressure were apparently normal.  She reports that she has not felt well for the past few days and has gained 5 pounds.  She has been taking naproxen sodium 500 mg b.i.d. for pleuritic chest pain, which is improved.  I explained that moderate doses of nonsteroidals could cause fluid retention.  She will increase furosemide to 40 mg b.i.d. until her weight returns to baseline.  She will decrease naproxen to 200 or 250 mg b.i.d.  She is scheduled for repeat lab in 3 days and a return office visit within the week.

## 2010-08-22 NOTE — Assessment & Plan Note (Signed)
Summary: ROV  Medications Added TRAMADOL HCL 50 MG TABS (TRAMADOL HCL) TAKE AS NEEDED FOR PAIN METOPROLOL TARTRATE 25 MG TABS (METOPROLOL TARTRATE) TAKE 1 TAB two times a day FLEXERIL 10 MG TABS (CYCLOBENZAPRINE HCL) Take 1 tablet by mouth three times a day as needed      Allergies Added:   Visit Type:  Follow-up Primary Provider:  [name], MD   History of Present Illness: [name] [name] is here on follow up after discharge from [facility] 08/05/09 after undergoing CABG and AoV replacement using a Edwards pericardial tissue valve.    [name] [name] was having exertional chest pain, SOB with known history of severe AoV stenosis.  She had multiple testing prior to referral to CVTS for surgery.  On July 27, 2009 she had redo sternotomy with SVG to RCA, and AoV replacement by Dr. Cyndia Bent.  She had some mild thrombocytopenia, anemai and thrush post operatively.  This was treated with blood transfusions, nystantin.   She remains nauseated, sore and complaining of muscle spasms in her R shoulder waking her at night.  She also complains of sore throat on the right, with tickle feeling on the R tonsil,  She has been to orientation for cardiac rehab but will start in a couple of weeks.  She has lost a significant amount of weight post-operatively 27lbs). She is not hungry and says the food tastes bad when she eats, having a bitter, burnt taste. She has also noticed her sight has become worse and she is unable to focus well.,  She has an appointment with her optimologist this week.  She continues to appear mildly anxious and depressed, easily tearful when talking about how badly she feels post-op and her limitations at this time, but she is hopeful that things will improve.  Current Medications (verified): 1)  Tricor 145 Mg Tabs (Fenofibrate) .Marland Kitchen.. 1 Tablet By Mouth Once Daily 2)  Januvia 100 Mg Tabs (Sitagliptin Phosphate) .Marland Kitchen.. 1 Tablet By Mouth Once Daily 3)  Crestor 20 Mg Tabs (Rosuvastatin Calcium)  .Marland Kitchen.. 1 Tablet By Mouth Once Daily 4)  Zetia 10 Mg Tabs (Ezetimibe) .Marland Kitchen.. 1 Tablet By Mouth At Bedtime 5)  Apidra 100 Unit/ml Soln (Insulin Glulisine) .Marland KitchenMarland KitchenMarland Kitchen 130-160  Units Per Day 6)  Calcium 600/vitamin D 600-400 Mg-Unit Tabs (Calcium Carbonate-Vitamin D) .Marland Kitchen.. 1 Tablet By Mouth Once Daily 7)  Vitamin D 1000 Unit Tabs (Cholecalciferol) .Marland Kitchen.. 1 Tablet By Mouth Once Daily 8)  Multivitamins   Tabs (Multiple Vitamin) .Marland Kitchen.. 1 Tablet By Mouth Two Times A Day 9)  Fish Oil 1000 Mg Caps (Omega-3 Fatty Acids) .Marland Kitchen.. 1 Tab Once Daily 10)  Aspirin 81 Mg Tbec (Aspirin) .... Take One Tablet By Mouth Daily 11)  Benefiber  Powd (Wheat Dextrin) .... 2 Tsp. Daily 12)  Furosemide 40 Mg Tabs (Furosemide) .... As Needed 13)  Tums 500 Mg Chew (Calcium Carbonate Antacid) .... As Needed 14)  Nitroglycerin 0.4 Mg Subl (Nitroglycerin) .... One Tablet Under Tongue Every 5 Minutes As Needed For Chest Pain---May Repeat Times Three 15)  Alprazolam 0.25 Mg Tabs (Alprazolam) .... At Bedtime 16)  Tramadol Hcl 50 Mg Tabs (Tramadol Hcl) .... Take As Needed For Pain 17)  Metoprolol Tartrate 25 Mg Tabs (Metoprolol Tartrate) .... Take 1 Tab Two Times A Day 18)  Flexeril 10 Mg Tabs (Cyclobenzaprine Hcl) .... Take 1 Tablet By Mouth Three Times A Day As Needed  Allergies (verified): 1)  ! Cleocin 2)  ! Penicillin 3)  ! Codeine 4)  ! Erythromycin 5)  !  Keflex 6)  ! Lipitor 7)  ! Zocor 8)  ! * Avandia 9)  ! * Metformin  Past History:  Past Surgical History: TAH/BSO Right nephrectomy, renal cell carcinoma 11/08 [facility]) CABG-most recent SVG to RCA Jan, 2011. 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  Social History: Reviewed history from 01/19/2009 and no changes required. Patient has never smoked.  Alcohol Use - no Illicit Drug Use - no Patient does not get regular exercise, has active lifestyle Full Time - elementary principal Married   Review of Systems        musculoskeletal pain/spasms in R shoulder and neck.  Difficulty seeing. Generalized fatigue,  Chronic nausea. All other systems have been reviewed and are negative unless stated above.   Vital Signs:  Patient profile:   60 year old female Weight:      109 pounds Pulse rate:   74 / minute BP sitting:   141 / 62  (right arm)  Vitals Entered By: Doretha Sou, CNA (August 16, 2009 10:55 AM)  Physical Exam  General:  Well developed, well nourished, in no acute distress. Head:  normocephalic and atraumatic Eyes:  PERRLA/EOM intact; conjunctiva and lids normal. Wears glasses Neck:  No spasms seen on evaluation.   Chest Wall:  Midline sternotomy healing well without signs of infection.   Steri strips remain in lower sternal area. Lungs:  CTA Heart:  RRR with 1/6 systolic murmur.at aortic area. Abdomen:  Bowel sounds positive; abdomen soft and non-tender without masses, organomegaly, or hernias noted. No hepatosplenomegaly. Msk:  No spasms seen in the right should but pain on palaption of pressure point.  Pulses:  pulses normal in all 4 extremities Extremities:  No clubbing or cyanosis. S/P vein harvest site in LLE. Neurologic:  Alert and oriented x 3. Skin:  Mulitple healing IV sites on chest and neck. Psych:  depressed affect and anxious.     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  Remains very sore from procedure, but does not have chest pain or sob.  She is to follow with Dr. Cyndia Bent post-operatively until released back to our service.  Problem # 2:  MUSCULOSKELETAL PAIN (ICD-781.99) I have prescribed flexeril for muscle spams which are occuring at night and waking her up.  She is advised to take ultram as directed for disconfort.  I will stop the crestor x 1 week to evaluate if myalgias are contributing.  She complains of leg discomfort as well.  She will have a BMET and lipids and LFT's  drawn to evaluate for hypokalemia and liver enzyme elevation.  This may be post-operative soreness, however to be thorough we will evaluate statin or electrolye imbalance causing symptoms.  If symptoms resolve, will restart Crestor at 12m.  Problem # 3:  DYSPHAGIA, PHARYNGOESOPHAGEAL PHASE ((OXB-353.29 I have referred her to Dr. TDema SeverinENT for evaluation concerning her unilateral tonsil pain and dsyphagia.  With her history of CA will have him evaluate further.  This may be post extubation soreness, but to be sure, will have her seen.  Other Orders: Future Orders: T-Lipid Profile (([id number] ... [id number]-Basic Metabolic Panel ([id number] ... 08/17/2009  Patient Instructions: 1)  Your physician recommends that you schedule a follow-up appointment in: 1 month 2)  Your physician recommends that you return for a FASTING lipid profile: Tomorrow  3)  Your physician has recommended you make the following  change in your medication: Stop taking Crestor for 1 week then restart, start taking Flexeril 28m by mouth three times a day as needed  4)  You have been referred to an ENT specialist. 5)  Remember to follow up with your Opthalmalogist for eye sight.  Prescriptions: FLEXERIL 10 MG TABS (CYCLOBENZAPRINE HCL) Take 1 tablet by mouth three times a day as needed  #30 x 0   Entered by:   LJeani HawkingVia LPN   Authorized by:   KJory Sims NP   Signed by:   LJeani HawkingVia LPN on 061/95/0932  Method used:   [facility] to        [facility]       [address]      [place]  [address]      Ph: [phone]      Fax: [phone]  RxID:   [id number]  Appended Document: ROV EKG report NSR with LBBB.  Rate controlled.

## 2010-08-22 NOTE — Initial Assessments (Signed)
Summary: rectal bleeding/ diarrhea/sheri    History of Present Illness Visit Type: Initial Consult Primary GI MD: Verl Blalock MD [place] Primary Provider: [name], MD Chief Complaint: rectal bleeding diarrhea, pt very weak History of Present Illness:   [name] [name] 60 YO FEMALE KNOWN TO DR. Jefm [name] SEEN IN 2008 WITH AN EPISODE OF ISCHEMIC COLITIS .SHE HAD COLONOSCOPY AFTER THAT WHICH WAS NEGATIVE. SHE HAS A COMPLICATED HX WITH IDDM,CHF,HYPERLIPIDEMIA.SHE UNDERWENT AORTIC VALVE REPAIR IN JAN 2011,DID WELL. SHE WAS REHOSPITALIZED IN FEB2001 WITH CHEST PAIN, EFFUSIONS. SHE WAS TREATED FOR PNEUMONIA, BUT FELT REALLY TO HAVE MORE OF A PLEURISY. SHE DID HAVE THORACENTESIS. THE PLEURISY WAS TREATED WITH PREDNISONE, THEN NAPROXEN SHE WAS DOING BETTER ,THEN BECAME ILL ON 3/17 WITH SEVERE LOWER ABDOMINAL CRAMPING,LOOSE STOOL WHICH TURNED TO WATERY DIARRHEA,THENSOME MARRON STOOL FOR ONE DAY. SHE HAD N/V/ AS WELL. NO DOCUMENTED FEVER.SHE SAW HER PRIMARY MD FRIDAY,HCT 32,GAVE TRIAL OF CARAFATE AND DEXILANT. OVER THE WEEKEND, SHE FELT A LITTLE BETTER SATURDAY,AND YESTERDAY MORNING THEN THE DIARRHEA CAME BACK WITH A VENGEANCE AND SHE HAS HAD WATERY STOOL EVERY 30 MINUTES SINCE.HER CRAMPING CONTINUES AS DOES THE NAUSEA.   GI Review of Systems    Reports abdominal pain, loss of appetite, nausea, and  vomiting.     Location of  Abdominal pain: lower abdomen.    Denies acid reflux, belching, bloating, chest pain, dysphagia with liquids, dysphagia with solids, heartburn, vomiting blood, and  weight loss.      Reports change in bowel habits, diarrhea, and  rectal bleeding.     Denies anal fissure, black tarry stools, diverticulosis, fecal incontinence, heme positive stool, hemorrhoids, irritable bowel syndrome, jaundice, light color stool, liver problems, and  rectal pain.    Current Medications (verified): 1)  Tricor 145 Mg Tabs (Fenofibrate) .Marland Kitchen.. 1 Tablet By Mouth Once Daily 2)  Januvia 100 Mg  Tabs (Sitagliptin Phosphate) .Marland Kitchen.. 1 Tablet By Mouth Once Daily 3)  Crestor 20 Mg Tabs (Rosuvastatin Calcium) .... Take One Tablet By Mouth Daily. 4)  Zetia 10 Mg Tabs (Ezetimibe) .Marland Kitchen.. 1 Tablet By Mouth At Bedtime 5)  Apidra 100 Unit/ml Soln (Insulin Glulisine) .... 70 Units Daily 6)  Calcium 600/vitamin D 600-400 Mg-Unit Tabs (Calcium Carbonate-Vitamin D) .Marland Kitchen.. 1 Tablet By Mouth Once Daily 7)  Vitamin D 1000 Unit Tabs (Cholecalciferol) .Marland Kitchen.. 1 Tablet By Mouth Once Daily 8)  Multivitamins   Tabs (Multiple Vitamin) .Marland Kitchen.. 1 Tablet By Mouth Two Times A Day 9)  Fish Oil 1000 Mg Caps (Omega-3 Fatty Acids) .Marland Kitchen.. 1 Tab Once Daily 10)  Benefiber  Tabs (Wheat Dextrin) .... 2 By Mouth Once Daily 11)  Furosemide 40 Mg Tabs (Furosemide) .... Take 1 Tab By Mouth Two Times A Day 12)  Tums 500 Mg Chew (Calcium Carbonate Antacid) .... As Needed 13)  Nitroglycerin 0.4 Mg Subl (Nitroglycerin) .... One Tablet Under Tongue Every 5 Minutes As Needed For Chest Pain---May Repeat Times Three 14)  Alprazolam 0.5 Mg Tabs (Alprazolam) .... Take 1-1.5 Tablets By Mouth At Bedtime For Sleep 15)  Tramadol Hcl 50 Mg Tabs (Tramadol Hcl) .... Take As Needed For Pain 16)  Metoprolol Tartrate 25 Mg Tabs (Metoprolol Tartrate) .... Take 1 Tab Two Times A Day 17)  Vitamin C Cr 500 Mg Cr-Tabs (Ascorbic Acid) .... Take 1 Tab Daily 18)  Zegerid 40-1100 Mg Caps (Omeprazole-Sodium Bicarbonate) .Marland Kitchen.. 1 By Mouth Once Daily 19)  Dexilant 60 Mg Cpdr (Dexlansoprazole) .Marland Kitchen.. 1 By Mouth Once Daily 20)  Vagifem 10 Mcg Tabs (Estradiol) .Marland KitchenMarland KitchenMarland Kitchen  1 Tab Vaginally At Bedtime For 2 Weeks 21)  Sucralfate  Powd (Sucralfate) .... 4 By Mouth Once Daily  Allergies (verified): 1)  ! Cleocin 2)  ! Penicillin 3)  ! Codeine 4)  ! Erythromycin 5)  ! Keflex 6)  ! Lipitor 7)  ! Zocor 8)  ! * Avandia 9)  ! * Metformin  Past History:  Past Medical History: Reviewed history from 09/29/2009 and no changes required. 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 Treated for gastroesophageal reflux disease in the past; ? 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  Family History: Reviewed history from 05/20/2008 and no changes required. No FH of Colon Cancer: Family History of Breast Cancer:Paternal half sister Lung cancer: Maternal half sister Family History of Ovarian Cancer:maternal half sister Family History of Diabetes: Father, maternal Uncle Family History of Heart Disease: Mother, Father, maternal/paternal Aunts and Uncles  Social History: Reviewed history from 01/19/2009 and no  changes required. Patient has never smoked.  Alcohol Use - no Illicit Drug Use - no Patient does not get regular exercise, has active lifestyle Full Time - elementary principal Married   Review of Systems       The patient complains of fatigue.  The patient denies allergy/sinus, anemia, anxiety-new, arthritis/joint pain, back pain, blood in urine, breast changes/lumps, change in vision, confusion, cough, coughing up blood, depression-new, fainting, fever, headaches-new, hearing problems, heart murmur, heart rhythm changes, itching, menstrual pain, muscle pains/cramps, night sweats, nosebleeds, shortness of breath, skin rash, sleeping problems, swollen lymph glands, thirst - excessive, urination - excessive, urination changes/pain, urine leakage, vision changes, and voice change.         ROS OTHERWISE AS IN HPI  Vital Signs:  Patient profile:   60 year old female Height:      63 inches Weight:      120 pounds Pulse rate:   110 / minute Pulse rhythm:   regular BP sitting:   120 / 50  (left arm)  Vitals Entered By: Christian Mate CMA Deborra Medina) (October 10, 2009 9:42 AM)  Physical Exam  General:  Well developed, well nourished, no acute distress. Head:  Normocephalic and atraumatic. Eyes:  PERRLA, no icterus. Lungs:  Clear throughout to auscultation. Heart:  Regular rate and rhythm; SOFT  murmurs. Abdomen:  SOFT, MILDLY TENDER ACROSS  LOWER ABDOMEN , BS+ NO GUARDING OR REBOUND, NO MASS OR HSM Rectal:  MUCOID  LIQUID,HEME POSITIVE, NO GROSS BLOOD Extremities:  No clubbing, cyanosis, edema or deformities noted. Neurologic:  Alert and  oriented x4;  grossly normal neurologically. Psych:  Alert and cooperative. Normal mood and affect.   Impression & Recommendations:  Problem # 1:  DIARRHEA-PRESUMED INFECTIOUS (ICD-009.3) Assessment New 60 YO FEMALE WITH ACUTE DIARRHEAL ILLNESS WITH PROFUSE WATERY STOOL,LOWER ABDOMINAL PAIN,TRANSIENT RECTAL BLEEDING-IN SETTING OF RECENT  HOSPITALIZATION,ANTIBIOTICS -R/O C. DIFF,?COMPONENT OF ISCHEMIA. PT WITH MULTIPLE COMORBIDITIES WITH HX OF CHF,RECENT AORTIC VALVE REPLACEMENT,IDDM.   WILL ADMIT TO [name] [name] View, FOR GENTLE HYDRATION,CAREFUL I/OS CARDIOLOGY CONSULT (DR. Lattie Haw) TO HELP WITH VOLUME MANAGEMENT ETC. LABS,STOOL CULTURES WILL START EMPIRIC FLAGYL 250 MG 4 X DAILY,AND FLORASTOR 2 TWICE DAILY LIQUID DIET SEE ORDERS.  Problem # 2:  CONGESTIVE HEART FAILURE (ICD-428.00) Assessment: Comment Only  Problem # 3:  HYPERTENSION (ICD-401.9) Assessment: Comment Only  Problem # 4:  PERSONAL HISTORY OF HODGKINS DISEASE (ICD-V10.72) Assessment: Comment Only  Problem # 5:  AORTIC STENOSIS (ICD-424.1) Assessment: Comment Only S/P AORTIC VALVE REPLACEMENT JAN 2011  Problem # 6:  CARCINOMA, RENAL CELL (ICD-189.0) Assessment: Comment Only S/P RIGHT NEPHRECTOMY 2008  Problem # 7:  DIABETES MELLITUS, TYPE II (ICD-250.00) Assessment: Comment Only INSULIN PUMP

## 2010-08-22 NOTE — Assessment & Plan Note (Signed)
Summary: pt having problems per Tammy/tg  Medications Added NAPROXEN 500 MG TABS (NAPROXEN) take 1 tablet by mouth two times a day X 10 days      Allergies Added:   Visit Type:  Follow-up Referring Provider:  Endocrine-Dr. Legrand Como [place] Primary Provider:  [name], MD  CC:  chest pain.  History of Present Illness: Mrs. Cude is here on follow up after discharge from [facility] 09/05/2009. She was there for chronic chest pain and treated for pericarditis and pleuritis.  This appeared to represent Dressler syndrome related to her recent cardiac surgery.  She ultimately responded to nonsteroidals and prednisone, but developed congestive heart failure in the hospital.  She responded well to diuretics.  After returning home, she noted continuing wheezing, malaise and exercise intolerance.  With additional diuretic therapy, she lost another 5 pounds and has returned to an asymptomatic status.     was also admitted on 08/05/09 after undergoing CABG and AoV replacement using a Edwards pericardial tissue valve.    Mrs. Rundell was having exertional chest pain, SOB with known history of severe AoV stenosis.  She had multiple testing prior to referral to [facility] for surgery.  On July 27, 2009 she had redo sternotomy with SVG to RCA, and AoV replacement by Dr. Cyndia Bent.  She had some mild thrombocytopenia, anemai and thrush post operatively.  This was treated with blood transfusions, nystantin.     She returns today with continued complaints of chest pain and R nect and shoulder discomfort, along with pain with deep breaths.  She is frustrated that she continues to experience this pain and wishes to eliminate this completely. The pain returned once she finshed her steroid regimine.  She still goes to cardiac rehab but has pain with inspiration during work-puts.  Current Medications (verified): 1)  Tricor 145 Mg Tabs (Fenofibrate) .Marland Kitchen.. 1 Tablet By Mouth Once Daily 2)  Januvia 100 Mg Tabs (Sitagliptin  Phosphate) .Marland Kitchen.. 1 Tablet By Mouth Once Daily 3)  Crestor 20 Mg Tabs (Rosuvastatin Calcium) .... Take One Tablet By Mouth Daily. 4)  Zetia 10 Mg Tabs (Ezetimibe) .Marland Kitchen.. 1 Tablet By Mouth At Bedtime 5)  Apidra 100 Unit/ml Soln (Insulin Glulisine) .... 70 Units Daily 6)  Calcium 600/vitamin D 600-400 Mg-Unit Tabs (Calcium Carbonate-Vitamin D) .Marland Kitchen.. 1 Tablet By Mouth Once Daily 7)  Vitamin D 1000 Unit Tabs (Cholecalciferol) .Marland Kitchen.. 1 Tablet By Mouth Once Daily 8)  Multivitamins   Tabs (Multiple Vitamin) .Marland Kitchen.. 1 Tablet By Mouth Two Times A Day 9)  Fish Oil 1000 Mg Caps (Omega-3 Fatty Acids) .Marland Kitchen.. 1 Tab Once Daily 10)  Aspirin 81 Mg Tbec (Aspirin) .... Take One Tablet By Mouth Daily 11)  Benefiber  Powd (Wheat Dextrin) .... 2 Tsp. Daily 12)  Furosemide 40 Mg Tabs (Furosemide) .... Take 1 Tab Daily 13)  Tums 500 Mg Chew (Calcium Carbonate Antacid) .... As Needed 14)  Nitroglycerin 0.4 Mg Subl (Nitroglycerin) .... One Tablet Under Tongue Every 5 Minutes As Needed For Chest Pain---May Repeat Times Three 15)  Alprazolam 0.25 Mg Tabs (Alprazolam) .... At Bedtime 16)  Tramadol Hcl 50 Mg Tabs (Tramadol Hcl) .... Take As Needed For Pain 17)  Metoprolol Tartrate 25 Mg Tabs (Metoprolol Tartrate) .... Take 1 Tab Two Times A Day 18)  Vitamin C Cr 500 Mg Cr-Tabs (Ascorbic Acid) .... Take 1 Tab Daily 19)  Ferrous Sulfate 324 Mg Tabs (Ferrous Sulfate) .... Take 1 Tab Daily D/c After Current Rx 20)  Naproxen 500 Mg Tabs (Naproxen) .... Take  1 Tablet By Mouth Two Times A Day X 10 Days  Allergies (verified): 1)  ! Cleocin 2)  ! Penicillin 3)  ! Codeine 4)  ! Erythromycin 5)  ! Keflex 6)  ! Lipitor 7)  ! Zocor 8)  ! * Avandia 9)  ! * Metformin  Review of Systems       Pain with deep breaths  All other systems have been reviewed and are negative unless stated above.   Vital Signs:  Patient profile:   60 year old female Weight:      120 pounds Pulse rate:   107 / minute BP sitting:   117 / 70  (right  arm)  Vitals Entered By: Doretha Sou, CNA (September 20, 2009 2:34 PM)  Physical Exam  General:  Well developed, well nourished, in no acute distress. Lungs:  Crackes in RUL diminished breath sounds in RLL.  CTA on the Left. Heart:  Tachycardic with 1/6 systolic murmur. Abdomen:  Bowel sounds positive; abdomen soft and non-tender without masses, organomegaly, or hernias noted. No hepatosplenomegaly. Msk:  Back normal, normal gait. Muscle strength and tone normal. Neurologic:  Alert and oriented x 3. Psych:  depressed affect.     EKG  Procedure date:  09/20/2009  Findings:      Sinus tachycardia with rate of:  107, with prolonged QT interval, T-wave inversion in V5, V6, I. 1st  degree block.  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 Lattie Haw as to whether to restart her on prednisone again vs NSAIDS, it has been decided to start her on naproxsyn 538m two times a day. She will have follow-up lab work in 1 week and have appt with Dr. RLattie Hawin 10 days.  If NSAID does not help, will consider sterioids again.  She continues to have recurrent pleuretic pain and musculoskeletal pain.,  I have encouraged her to continue cardiac rehab.  She can take tylenol for pain in between naproxsyn doses.  Problem # 2:  CHEST PAIN, EXERTIONAL WITH DYSPNEA (ICD-786.50)  She is  also tachycardic today. This may be related to her pain at this time. Will recheck EKG on next visit.  Her updated medication list for this problem includes:    Aspirin 81 Mg Tbec (Aspirin) ..Marland Kitchen.. Take one tablet by mouth daily    Nitroglycerin 0.4 Mg Subl (Nitroglycerin) ..... One tablet under tongue every 5 minutes as needed for chest pain---may repeat times three    Metoprolol Tartrate 25 Mg Tabs (Metoprolol tartrate) ..Marland Kitchen.. Take 1 tab two times a day  Future Orders: T-Basic Metabolic Panel ([id number] ... 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 Take one tablet by mouth daily    Furosemide 40 Mg Tabs (Furosemide) ..Marland Kitchen.. Take 1 tab daily    Metoprolol Tartrate 25 Mg Tabs (Metoprolol tartrate) ..Marland Kitchen.. Take 1 tab two times a day  Patient Instructions: 1)  Your physician recommends that you schedule a follow-up appointment for the end of next week 2)  Your physician recommends that you return for lab work on Monday 3)  Your physician has recommended you make the following change in your medication: Start taking Naproxyn 5044mby mouth two times a day X 10 days Prescriptions: NAPROXEN 500 MG TABS (NAPROXEN)  take 1 tablet by mouth two times a day X 10 days  #20 x 0   Entered by:   Jeani Hawking Via LPN   Authorized by:   Jory Sims, NP   Signed by:   Jeani Hawking Via LPN on 50/41/3643   Method used:   Electronically to        [facility] (retail)       [address]       [place], [place]  [address]       Ph: [phone] or [phone]       Fax: [phone]   RxID:   [id number]

## 2010-08-22 NOTE — Letter (Signed)
[facility]  [facility] 01-06-10 OFFICE NOTE   Imported By: [place] Crane 02/03/2010 13:01:48  _____________________________________________________________________  External Attachment:    Type:   Image     Comment:   External Document

## 2010-08-22 NOTE — Miscellaneous (Signed)
**Note De-Identified [name] [name] Obfuscation** Summary: CMP w/est.GFR, CBC, and TSH  Clinical Lists Changes  Observations: Added new observation of CALCIUM: 10.6 mg/dL (11/28/2009 16:17) Added new observation of ALBUMIN: 4.7 g/dL (11/28/2009 16:17) Added new observation of PROTEIN, TOT: 7.4 g/dL (11/28/2009 16:17) Added new observation of SGPT (ALT): 16 units/L (11/28/2009 16:17) Added new observation of SGOT (AST): 23 units/L (11/28/2009 16:17) Added new observation of ALK PHOS: 62 units/L (11/28/2009 16:17) Added new observation of BILI DIRECT: Bili Total: 0.3 mg/dL (11/28/2009 16:17) Added new observation of GFR AA: >60 mL/min/1.24m (11/28/2009 16:17) Added new observation of GFR: 50 mL/min (11/28/2009 16:17) Added new observation of CREATININE: 1.03 mg/dL (11/28/2009 16:17) Added new observation of BUN: 35 mg/dL (11/28/2009 16:17) Added new observation of BG RANDOM: 74 mg/dL (11/28/2009 16:17) Added new observation of CO2 PLSM/SER: 25 meq/L (11/28/2009 16:17) Added new observation of CL SERUM: 101 meq/L (11/28/2009 16:17) Added new observation of K SERUM: 4.2 meq/L (11/28/2009 16:17) Added new observation of NA: 141 meq/L (11/28/2009 16:17) Added new observation of PLATELETK/UL: 214 K/uL (11/28/2009 16:17) Added new observation of MCV: 88.0 fL (11/28/2009 16:17) Added new observation of HCT: 37.5 % (11/28/2009 16:17) Added new observation of HGB: 11.5 g/dL (11/28/2009 16:17) Added new observation of WBC COUNT: 11.6 10*3/microliter (11/28/2009 16:17) Added new observation of TSH: 3.883 microintl units/mL (11/28/2009 16:17)

## 2010-08-22 NOTE — Letter (Signed)
Summary: [facility] REFERRAL   Imported By: Nevada Crane 12/06/2009 12:11:52  _____________________________________________________________________  External Attachment:    Type:   Image     Comment:   External Document

## 2010-08-22 NOTE — Miscellaneous (Signed)
Summary: labs bmp 02/23/2010  Clinical Lists Changes  Observations: Added new observation of CALCIUM: 10.7 mg/dL (02/23/2010 8:08) Added new observation of CREATININE: 1.54 mg/dL (02/23/2010 8:08) Added new observation of BUN: 54 mg/dL (02/23/2010 8:08) Added new observation of BG RANDOM: 195 mg/dL (02/23/2010 8:08) Added new observation of CO2 PLSM/SER: 29 meq/L (02/23/2010 8:08) Added new observation of CL SERUM: 95 meq/L (02/23/2010 8:08) Added new observation of K SERUM: 3.8 meq/L (02/23/2010 8:08) Added new observation of NA: 136 meq/L (02/23/2010 8:08)

## 2010-08-22 NOTE — Assessment & Plan Note (Signed)
Summary: PT HAVING PROBLEMS W/CHEST PAIN/TG      Allergies Added:   Visit Type:  Follow-up Referring Provider:  Dr. Elyse Hsu Primary Provider:  [name], MD   History of Present Illness: [name] is seen in the office today at her request as the result of a prolonged episode of chest discomfort a few days earlier.  This nice woman has a history of single-vessel coronary disease that required multiple percutaneous interventions and ultimately CABG surgery.  Unfortunately, she subsequently developed aortic stenosis necessitating repeat open heart surgery, valve replacement and revision of her coronary graft approximately 18 months ago.  She has been troubled by recurrent congestive heart failure, presumed secondary to diastolic dysfunction.  A few nights ago, she noted the onset of left chest aching discomfort at rest of moderate intensity.  She took sublingual nitroglycerin spray without benefit and then assumed a supine position, which resulted in exacerbation of her discomfort.  She tolerated the increased level of pain for some minutes, utilized additional sublingual nitroglycerin and ultimately noted essentially complete resolution after 1-2 hours.  She was fatigued and mildly nauseated the following day with minimal residual discomfort.  Since then, she has felt fine.  She has developed chest discomfort in recent months with moderate exertion, for instance walking up a hill or stairs.  She generally has prompt relief with sublingual nitroglycerin or rest.  EKG  Procedure date:  05/11/2010  Findings:      Normal sinus rhythm First degree AV block Left atrial abnormality Right ventricular conduction delay Delayed R-wave progression Borderline QT prolongation Nonspecific ST segment abnormality Comparison to a prior study performed 05/25/08: PR Interval has increased; otherwise no significant change.   Current Medications (verified): 1)  Tricor 145 Mg Tabs  (Fenofibrate) .Marland Kitchen.. 1 Tablet By Mouth Once Daily 2)  Januvia 100 Mg Tabs (Sitagliptin Phosphate) .Marland Kitchen.. 1 Tablet By Mouth Once Daily 3)  Crestor 10 Mg Tabs (Rosuvastatin Calcium) .... Take One Tablet By Mouth Daily. 4)  Zetia 10 Mg Tabs (Ezetimibe) .Marland Kitchen.. 1 Tablet By Mouth At Bedtime 5)  Apidra 100 Unit/ml Soln (Insulin Glulisine) .Marland KitchenMarland KitchenMarland Kitchen 175 Units Daily 6)  Calcium 600/vitamin D 600-400 Mg-Unit Tabs (Calcium Carbonate-Vitamin D) .Marland Kitchen.. 1 Tablet By Mouth Two Times A Day 7)  Multivitamins   Tabs (Multiple Vitamin) .... One Tablet By Mouth Daily 8)  Benefiber  Tabs (Wheat Dextrin) .... 2 By Mouth Once Daily 9)  Furosemide 80 Mg Tabs (Furosemide) .... Take 1 Tablet By Mouth By Mouth Once Daily 10)  Tums 500 Mg Chew (Calcium Carbonate Antacid) .... As Needed 11)  Alprazolam 0.5 Mg Tabs (Alprazolam) .... Take 1/2 Tab Daily 12)  Metoprolol Tartrate 25 Mg Tabs (Metoprolol Tartrate) .... Take 1 Tab Two Times A Day 13)  Vitamin C Cr 500 Mg Cr-Tabs (Ascorbic Acid) .... Take 1 Tab Daily 14)  Coq10 100 Mg Caps (Coenzyme Q10) .... Take 2 Tabs Daily 15)  Omega-3 Krill Oil 300 Mg Caps (Krill Oil) .... Take 1 Cap Daily 16)  Colace 100 Mg Caps (Docusate Sodium) .... Two Tablets By Mouth in The Morning 17)  Furosemide 40 Mg Tabs (Furosemide) .... Take One Tablet By Mouth Daily As Needed On Days When Wt Is Increased 3-4 Lbs 18)  Potassium Chloride Crys Cr 20 Meq Cr-Tabs (Potassium Chloride Crys Cr) .... Take 2 Tablets By Mouth Daily 19)  Aspir-Low 81 Mg Tbec (Aspirin) .... Take 1 Tab Daily 20)  Nitrolingual 0.4 Mg/spray Soln (Nitroglycerin) .... One Spray Under Tongue Every 5  Minutes As Needed For Chest Pain---May Repeat Times Three  Allergies (verified): 1)  ! Cleocin 2)  ! Penicillin 3)  ! Codeine 4)  ! Erythromycin 5)  ! Keflex 6)  ! Lipitor 7)  ! Zocor 8)  ! * Avandia 9)  ! * Metformin  Comments:  Nurse/Medical Assistant: reviewed previous med list with patient from previous ov  Past History:  PMH, FH,  and Social History reviewed and updated.  Review of Systems       See history of present illness.  Vital Signs:  Patient profile:   60 year old female Weight:      123 pounds BMI:     21.87 Pulse rate:   83 / minute BP sitting:   130 / 66  (right arm)  Vitals Entered By: Doretha Sou, CNA (May 11, 2010 12:59 PM)  Physical Exam  General:  Trim; well developed; no acute distress: Weight-123, stable   Neck-No JVD; no carotid bruits, but transmitted murmur present Lungs-No tachypnea, no rales; no rhonchi; no wheezes; Cardiovascular-normal PMI; normal S1 and increased S2; grade 2/6 basilar early systolic ejection murmur at the cardiac base Abdomen-BS normal; soft and non-tender without masses or organomegaly:  Neurologic-Normal cranial nerves; symmetric strength and tone:  Skin-Warm, no significant lesions: Extremities-Nl distal pulses; no edema:   Impression & Recommendations:  Problem # 1:  CORONARY ARTERY DISEASE (ICD-414.00) Recurrent and more severe chest discomfort without classic characteristics of angina in this woman with coronary, valvular and myocardial heart disease, all presumed secondary to radiation therapy for Hodgkin's lymphoma.  She has not had a cardiac assessment since surgery.  This would be an unusual time for graft occlusion.  She has had minor coronary disease in other vessels, but this has been nonprogressive over many years.  We will proceed with a stress echocardiogram.  Medical therapy will not be changed for the time being.  Other Orders: Stress Echo (Stress Echo)  Patient Instructions: 1)  Your physician recommends that you schedule a follow-up appointment in: [place] 6, 2012 2)  Your physician has requested that you have a stress echocardiogram. For further information please visit [email or web address].  Please follow instruction sheet as given.

## 2010-08-22 NOTE — Assessment & Plan Note (Signed)
Summary: f13mPOST HOSP PER PT PHONE CALL/TG  Medications Added CRESTOR 20 MG TABS (ROSUVASTATIN CALCIUM) Take one tablet by mouth daily. APIDRA 100 UNIT/ML SOLN (INSULIN GLULISINE) 70 units daily FUROSEMIDE 40 MG TABS (FUROSEMIDE) take 1 tab daily VITAMIN C CR 500 MG CR-TABS (ASCORBIC ACID) take 1 tab dAILY FERROUS SULFATE 324 MG TABS (FERROUS SULFATE) TAKE 1 TAB DAILY d/c after current rx      Allergies Added:   Visit Type:  Follow-up Referring Provider:  Endocrine-Dr. MLegrand ComoAltheimer Primary Provider:  WElsie Lincoln MD  CC:  post hosp.  History of Present Illness: Ms. 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  She ultimately responded to nonsteroidals and prednisone, but developed congestive heart failure in the hospital.  She responded well to diuretics.  After returning home, she noted continuing wheezing, malaise and exercise intolerance.  With additional diuretic therapy, she lost another 5 pounds and has returned to an asymptomatic status.  CBGs have been elevated with values as high as 300.  This appears to reflect the effects of steroids and has been improving as her dose decreases.  She will consult her endocrinologist for more precise management of serum glucose once all of her acute medical issues are stable.  Current Medications (verified): 1)  Tricor 145 Mg Tabs (Fenofibrate) ..Marland Kitchen. 1 Tablet By Mouth Once Daily 2)  Januvia 100 Mg Tabs (Sitagliptin Phosphate) ..Marland Kitchen. 1 Tablet By Mouth Once Daily 3)  Crestor 10 Mg Tabs (Rosuvastatin Calcium) .... Take 2 Tab Daily 4)  Zetia 10 Mg Tabs (Ezetimibe) ..Marland Kitchen. 1 Tablet By Mouth At Bedtime 5)  Apidra 100 Unit/ml Soln (Insulin Glulisine) .... 70 Units Daily 6)  Calcium 600/vitamin D 600-400 Mg-Unit Tabs (Calcium Carbonate-Vitamin D) ..Marland Kitchen. 1 Tablet By Mouth Once Daily 7)  Vitamin D 1000 Unit Tabs  (Cholecalciferol) ..Marland Kitchen. 1 Tablet By Mouth Once Daily 8)  Multivitamins   Tabs (Multiple Vitamin) ..Marland Kitchen. 1 Tablet By Mouth Two Times A Day 9)  Fish Oil 1000 Mg Caps (Omega-3 Fatty Acids) ..Marland Kitchen. 1 Tab Once Daily 10)  Aspirin 81 Mg Tbec (Aspirin) .... Take One Tablet By Mouth Daily 11)  Benefiber  Powd (Wheat Dextrin) .... 2 Tsp. Daily 12)  Furosemide 40 Mg Tabs (Furosemide) .... Take 1 Tab Daily 13)  Tums 500 Mg Chew (Calcium Carbonate Antacid) .... As Needed 14)  Nitroglycerin 0.4 Mg Subl (Nitroglycerin) .... One Tablet Under Tongue Every 5 Minutes As Needed For Chest Pain---May Repeat Times Three 15)  Alprazolam 0.25 Mg Tabs (Alprazolam) .... At Bedtime 16)  Tramadol Hcl 50 Mg Tabs (Tramadol Hcl) .... Take As Needed For Pain 17)  Metoprolol Tartrate 25 Mg Tabs (Metoprolol Tartrate) .... Take 1 Tab Two Times A Day 18)  Vitamin C Cr 500 Mg Cr-Tabs (Ascorbic Acid) .... Take 1 Tab Daily 19)  Ferrous Sulfate 324 Mg Tabs (Ferrous Sulfate) .... Take 1 Tab Daily 20)  Ferrous Sulfate 324 Mg Tabs (Ferrous Sulfate) .... Take 1 Tab Daily D/c After Current Rx  Allergies (verified): 1)  ! Cleocin 2)  ! Penicillin 3)  ! Codeine 4)  ! Erythromycin 5)  ! Keflex 6)  ! Lipitor 7)  ! Zocor 8)  ! * Avandia 9)  ! * Metformin  Past History:  PMH, FH, and Social History reviewed and updated.  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 Treated for gastroesophageal reflux disease in the past; ? 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  Vital Signs:  Patient profile:   60 year old female Weight:      118 pounds Pulse rate:   101 / minute BP sitting:   109 / 66  (right arm)  Vitals Entered By: Doretha Sou, CNA (September 14, 2009 2:51 PM)  Physical Exam  General:  Trim, well developed; no acute distress:   Neck-No JVD; faint transmitted murmur to carotids: Lungs-No tachypnea, no rales; no rhonchi; no wheezes; dullness to percussion and decreased breath sounds at the right base Cardiovascular-normal PMI; normal S1 and S2; grade 6-1/6 systolic ejection murmur at the cardiac base Thorax-well-healed median sternotomy Abdomen-BS normal; soft and non-tender without masses or organomegaly:  Musculoskeletal-No deformities, no cyanosis or clubbing: Neurologic-Normal cranial nerves; symmetric strength and tone:  Skin-Warm, no significant lesions: Extremities-Nl distal pulses; no edema:     Impression & Recommendations:  Problem # 1:  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  She will resume participation in cardiac rehabilitation.  She does not require additional visits with Dr. Cyndia Bent.  Problem # 2:  ESSENTIAL HYPERTENSION, BENIGN (ICD-401.1) Blood pressure is well controlled.  .The highest systolic that she has recorded is 140.  Current  medications will be continued  Problem # 3:  CORONARY ARTERY DISEASE (ICD-414.00) She will resume Crestor at her usual dose of 20 mg q.d.  A lipid profile and metabolic profile will be obtained in 2 months  Problem # 4:  ANEMIA (ICD-285.9) CBC will be rechecked.  I suspect that she can just continue her iron supplement.  Problem # 5:  congestive heart failure Furosemide will be continued for now, but ultimately I suspect the dosage can be reduced or the drug discontinued entirely.  Potassium will be held unless required.  A chemistry profile will be obtained.  I hope that from this moment on, Blessin will finally have achieved a measure of medical stability.  I will plan to see her again in 4 months.  Other Orders: T-Basic Metabolic Panel ([id number]) Future Orders: T-Comprehensive Metabolic Panel ([id number]) ... 10/26/2009 T-Lipid Profile [phone]) ... 10/26/2009  Patient Instructions: 1)  Your physician recommends that you schedule a follow-up appointment in: 4 months 2)  Your physician recommends that you return for lab work in: today and 6 week 3)  Your physician has recommended you make the following change in your medication:  increase crestor to 76m daily, stop potassium, stop ferrous sulfate after current rx complete 4)  resume cardiac rehab

## 2010-08-22 NOTE — Progress Notes (Signed)
Summary: sharpe pain in chest neck and shoulder   Phone Note Call from Patient Call back at Home Phone [phone]   Caller: pt Reason for Call: Talk to Nurse Summary of Call: pt has gone off prednisone but is now having tightness and sharpe pain when taking deep breath in shoulder, neck and mid chest gets worse every day. This is the same thing she went into the ED with a couple weeks ago.  Finished prednisone dose pk Tuesday. Initial call taken by: Nevada Crane,  September 20, 2009 9:09 AM  Follow-up for Phone Call        Pain in upper back in neck, when take a deep breath, sharpe pain in center of chest.  Feels exactly like it did when she had fluid in the hospital, it is getting worse everyday.  Has been increasing since last Friday. Pt finished her prednisone dose pk 09/13/2009 Follow-up by: Tye Savoy RN,  September 20, 2009 9:26 AM  Additional Follow-up for Phone Call Additional follow up Details #1::        pt to have cxr today and see K. Mobley,NP today at 3pm Additional Follow-up by: Tye Savoy RN,  September 20, 2009 9:32 AM

## 2010-08-22 NOTE — Letter (Signed)
Summary: Office note of Juanda Bond Altheimer MD  Juanda Bond Altheimer MD   Imported By: Bubba Hales 11/15/2009 10:47:04  _____________________________________________________________________  External Attachment:    Type:   Image     Comment:   External Document

## 2010-08-22 NOTE — Letter (Signed)
Summary: labs 01-12-10  labs 01-12-10   Imported By: Nevada Crane 02/21/2010 10:14:55  _____________________________________________________________________  External Attachment:    Type:   Image     Comment:   External Document

## 2010-08-22 NOTE — Miscellaneous (Signed)
Summary: LABS BMP,09/14/2009  Clinical Lists Changes  Observations: Added new observation of CALCIUM: 10.1 mg/dL (09/14/2009 10:13) Added new observation of CREATININE: 1.17 mg/dL (09/14/2009 10:13) Added new observation of BUN: 36 mg/dL (09/14/2009 10:13) Added new observation of BG RANDOM: 191 mg/dL (09/14/2009 10:13) Added new observation of CO2 PLSM/SER: 29 meq/L (09/14/2009 10:13) Added new observation of CL SERUM: 95 meq/L (09/14/2009 10:13) Added new observation of K SERUM: 4.0 meq/L (09/14/2009 10:13) Added new observation of NA: 139 meq/L (09/14/2009 10:13)

## 2010-08-22 NOTE — Letter (Signed)
Summary: DR Glenice Bow OFFICE NOTE 04/21/10  DR Glenice Bow OFFICE NOTE 04/21/10   Imported By: Nevada Crane 05/04/2010 14:04:13  _____________________________________________________________________  External Attachment:    Type:   Image     Comment:   External Document

## 2010-08-22 NOTE — Progress Notes (Signed)
Summary: Refill  Medications Added ZETIA 10 MG TABS (EZETIMIBE) 1 tablet by mouth at bedtime       Phone Note Call from Patient   Caller: Patient Reason for Call: Refill Medication Summary of Call: pt needs refill for Zetia sent to [facility]/tg Initial call taken by: Alphonsus Sias [facility],  August 30, 2009 3:18 PM    New/Updated Medications: ZETIA 10 MG TABS (EZETIMIBE) 1 tablet by mouth at bedtime Prescriptions: ZETIA 10 MG TABS (EZETIMIBE) 1 tablet by mouth at bedtime  #30 x 3   Entered by:   Jeani Hawking Via LPN   Authorized by:   Yehuda Savannah, MD, [facility]   Signed by:   Jeani Hawking Via LPN on 94/58/5929   Method used:   Electronically to        [facility] (retail)       [address]       [place], [place]  [address]       Ph: [phone] or [phone]       Fax: [phone]   RxID:   [id number]

## 2010-08-22 NOTE — Letter (Signed)
Summary: DR Elyse Hsu OFFICE NOTE  DR ALTHEIMER OFFICE NOTE   Imported By: Nevada Crane 10/28/2009 10:31:29  _____________________________________________________________________  External Attachment:    Type:   Image     Comment:   External Document

## 2010-08-22 NOTE — Progress Notes (Signed)
Summary: Pt having problems                                          Phone Note Call from Patient Call back at [phone]   Caller: Patient Reason for Call: Talk to Nurse Summary of Call: pt states she is having pain in center right back/right at the spot where she had fluid withdrawn/not as bad as when she was hospitalized but is progressively getting worse/pls call with advice/tg Initial call taken by: Alphonsus Sias [facility],  October 17, 2009 9:18 AM  Follow-up for Phone Call        center of right side,right shoulder,and neck leaning over desk and standing is fine sitting and lying horrible per pt still losing wt lost 3 lbs yesterday short of breath,lying bending, but no pain NSAIDS caused vomiting and diarrhea per pcp gastroenterologist is treating C-diff, cult was neg Follow-up by: Tye Savoy RN,  October 17, 2009 10:26 AM  Additional Follow-up for Phone Call Additional follow up Details #1::        Needs to be seen by PMD.  Unlikely this is related to thoracentesis or to her cardiac issues.  [name], M.D.  Additional Follow-up by: Yehuda Savannah, MD, [facility],  October 19, 2009 9:06 AM    Additional Follow-up for Phone Call Additional follow up Details #2::    Patient advised. She states she understands info. given. Follow-up by: Jeani Hawking Via LPN,  October 20, 8339 11:18 AM

## 2010-08-22 NOTE — Miscellaneous (Signed)
Summary: metoprolol refill  Clinical Lists Changes  Medications: Rx of METOPROLOL TARTRATE 25 MG TABS (METOPROLOL TARTRATE) TAKE 1 TAB two times a day;  #60 x 6;  Signed;  Entered by: Tye Savoy RN;  Authorized by: Yehuda Savannah, MD, [facility];  Method used: Electronically to [facility] [address], [place], [place], [place]  [address], Ph: [phone] or [phone], Fax: [phone]    Prescriptions: METOPROLOL TARTRATE 25 MG TABS (METOPROLOL TARTRATE) TAKE 1 TAB two times a day  #60 x 6   Entered by:   Tye Savoy RN   Authorized by:   Yehuda Savannah, MD, [facility]   Signed by:   Tye Savoy RN on 10/12/2009   Method used:   Electronically to        [facility] (retail)       [address]       [place], [place]  [address]       Ph: [phone] or [phone]       Fax: [phone]   RxID:   [id number]

## 2010-08-22 NOTE — Assessment & Plan Note (Signed)
Summary: 4 mth f/u per checkout on 09/14/09/tg  Medications Added POTASSIUM CHLORIDE CRYS CR 20 MEQ CR-TABS (POTASSIUM CHLORIDE CRYS CR) take 2 tablets by mouth daily      Allergies Added:   Visit Type:  Follow-up Referring Provider:  Dr. Elyse Hsu Primary Provider:  [name], MD   History of Present Illness: [name] returns to the office as scheduled for continued assessment and treatment of apparent congestive heart failure following combined AVR/CABG surgery.  She is improved, experiencing a handful of episodes over the past 2 months.  These are described as typical PND.  She awakens with dyspnea and a rattling sensation in her upper airway.  After sitting up or walking for a brief period of time, she improves.  She sometimes takes additional furosemide and frequently is able to return to sleep,  often in a recliner.  She notes substernal chest discomfort that typically occurs with cough or deep breathing and also experiences chest discomfort at the right costal margin associated with long-term tenderness.  Exercise tolerance is gradually improving.  She has nearly completed the cardiac rehabilitation program.  Her most recent chest x-ray was in 10/2009 and was unremarkable.  She also reports episodic swelling in her hands and discomfort in the joints, typically at night.  She has had no peripheral edema.  She has experienced no lightheadedness and no syncope.   Current Medications (verified): 1)  Tricor 145 Mg Tabs (Fenofibrate) .Marland Kitchen.. 1 Tablet By Mouth Once Daily 2)  Januvia 100 Mg Tabs (Sitagliptin Phosphate) .Marland Kitchen.. 1 Tablet By Mouth Once Daily 3)  Crestor 20 Mg Tabs (Rosuvastatin Calcium) .... Take One Tablet By Mouth Daily. 4)  Zetia 10 Mg Tabs (Ezetimibe) .Marland Kitchen.. 1 Tablet By Mouth At Bedtime 5)  Apidra 100 Unit/ml Soln (Insulin Glulisine) .Marland KitchenMarland KitchenMarland Kitchen 175 Units Daily 6)  Calcium 600/vitamin D 600-400 Mg-Unit Tabs (Calcium Carbonate-Vitamin D) .Marland Kitchen.. 1 Tablet By Mouth Two Times A  Day 7)  Vitamin D 1000 Unit Tabs (Cholecalciferol) .Marland Kitchen.. 1 Tablet By Mouth Once Daily 8)  Multivitamins   Tabs (Multiple Vitamin) .... One Tablet By Mouth Two Times A Day 9)  Benefiber  Tabs (Wheat Dextrin) .... 2 By Mouth Once Daily 10)  Furosemide 80 Mg Tabs (Furosemide) .... Take 1 Tablet By Mouth By Mouth Once Daily 11)  Tums 500 Mg Chew (Calcium Carbonate Antacid) .... As Needed 12)  Nitroglycerin 0.4 Mg Subl (Nitroglycerin) .... One Tablet Under Tongue Every 5 Minutes As Needed For Chest Pain---May Repeat Times Three 13)  Alprazolam 0.5 Mg Tabs (Alprazolam) .... Take 1-1.5 Tablets By Mouth At Bedtime For Sleep 14)  Metoprolol Tartrate 25 Mg Tabs (Metoprolol Tartrate) .... Take 1 Tab Two Times A Day 15)  Vitamin C Cr 500 Mg Cr-Tabs (Ascorbic Acid) .... Take 1 Tab Daily 16)  Coq10 100 Mg Caps (Coenzyme Q10) .... Take 1 Tab Daily 17)  Omega-3 Krill Oil 300 Mg Caps (Krill Oil) .... Take 1 Cap Daily 18)  Colace 100 Mg Caps (Docusate Sodium) .... Two Tablets By Mouth in The Morning 19)  Furosemide 40 Mg Tabs (Furosemide) .... Take One Tablet By Mouth Daily As Needed On Days When Wt Is Increased 3-4 Lbs 20)  Potassium Chloride Crys Cr 20 Meq Cr-Tabs (Potassium Chloride Crys Cr) .... Take 2 Tablets By Mouth Daily  Allergies (verified): 1)  ! Cleocin 2)  ! Penicillin 3)  ! Codeine 4)  ! Erythromycin 5)  ! Keflex 6)  ! Lipitor 7)  ! Zocor 8)  ! *  Avandia 9)  ! * Metformin  Past History:  PMH, FH, and Social History reviewed and updated.  Review of Systems       See history of present illness.  Vital Signs:  Patient profile:   60 year old female Weight:      120 pounds BMI:     21.33 Pulse rate:   90 / minute BP sitting:   120 / 70  (right arm)  Vitals Entered By: Doretha Sou, CNA (January 11, 2010 12:59 PM)  Physical Exam  General:  Trim; well developed; no acute distress: Weight-120, 1 pound less than at her last visit   Neck-No JVD; no carotid bruits, but transmitted  murmur present Lungs-No tachypnea, no rales; no rhonchi; no wheezes; mildly decreased breath sounds at the right base Cardiovascular-normal PMI; normal S1 and S2; grade 2/6 basilar early systolic ejection murmur.  Minimal early diastolic murmur. Abdomen-BS normal; soft and non-tender without masses or organomegaly:  Neurologic-Normal cranial nerves; symmetric strength and tone:  Skin-Warm, no significant lesions: Extremities-Nl distal pulses; no edema:   Impression & Recommendations:  Problem # 1:  CORONARY ARTERY DISEASE (ICD-414.00) Following bypass surgery for single-vessel disease, we would expect durable relief of symptoms.  This appears to be the case so far.  Problem # 2:  AORTIC STENOSIS (ICD-424.1) She is encouraged to gradually increase her level of activity.  She will seek evaluation in the emergency department should her typical nocturnal symptoms persist despite assuming an upright position.  I will plan to see her again in 3 months.  Other Orders: T-Basic Metabolic Panel ([id number])  Patient Instructions: 1)  Your physician recommends that you schedule a follow-up appointment in:  2)  Your physician recommends that you return for lab work TM:[id number] 3, 2011 3)  Your physician has recommended you make the following change in your medication:  increase potassium to 2 tablets by mouth daily Prescriptions: POTASSIUM CHLORIDE CRYS CR 20 MEQ CR-TABS (POTASSIUM CHLORIDE CRYS CR) take 2 tablets by mouth daily  #60 x 3   Entered by:   Tye Savoy RN   Authorized by:   Yehuda Savannah, MD, [facility]   Signed by:   Tye Savoy RN on 01/11/2010   Method used:   Electronically to        [facility] (retail)       [name]       [place],   [address]       Ph: [phone] or [phone]       Fax: [phone]   RxID:   [id number]

## 2010-08-22 NOTE — Letter (Signed)
Summary: [facility], general practice at [place]. [address] [place] [address]   Phone: [phone]  Fax: [phone]     December 22, 2009 MRN: [id number]   [name] [name] [address] [place], [place]  [address]      YOUR LAB WORK IS DUE  February 20, 2010 _________________________________________  Please go to [facility], located across the street from [facility] on the second floor.  Hours are Monday - Friday 7am until 7:30pm         Saturday 8am until 12noon    __  DO NOT EAT OR DRINK AFTER MIDNIGHT EVENING PRIOR TO LABWORK  _X_ YOUR LABWORK IS NOT FASTING --YOU MAY EAT PRIOR TO LABWORK

## 2010-08-22 NOTE — Assessment & Plan Note (Signed)
Summary: rov  Medications Added APIDRA 100 UNIT/ML SOLN (INSULIN GLULISINE) 175 units daily FUROSEMIDE 40 MG TABS (FUROSEMIDE) take 1 1/2  tablets  by mouth once daily FUROSEMIDE 40 MG TABS (FUROSEMIDE) Take one tablet by mouth daily as needed on days when wt is increased 3-4 lbs POTASSIUM CHLORIDE CRYS CR 20 MEQ CR-TABS (POTASSIUM CHLORIDE CRYS CR) Take 1 tablet by mouth once a day      Allergies Added:   Visit Type:  Follow-up Referring Provider:  Dr. Elyse Hsu Primary Provider:  [name], MD   History of Present Illness: [name] [name] returns to the office as scheduled for continued assessment and treatment of coronary artery disease and valvular heart disease, now nearly 4 months following combined CABG and AVR surgery.  She continues to feel better each passing week, but has been experiencing some difficulties.  Diabetic control has improved following rotation of needle insertion sites utilized with her insulin pump.  She is followed weight carefully and takes an extra 40 mg of furosemide occasionally for a weight gain of 3-4 pounds.  She has experienced intermittent dyspnea with chest discomfort during exercise that resolves with rest.  In recent nights, she has noted rattling in her chest, which she correlates with episodes of pulmonary edema.  This tends to occur when she is lying on her right side and resolves when she sits up or gets out of bed.  She has taken extra furosemide only one day in the past 4, but has had these symptoms each night, which are interfering with her sleep.  Current Medications (verified): 1)  Tricor 145 Mg Tabs (Fenofibrate) .Marland Kitchen.. 1 Tablet By Mouth Once Daily 2)  Januvia 100 Mg Tabs (Sitagliptin Phosphate) .Marland Kitchen.. 1 Tablet By Mouth Once Daily 3)  Crestor 20 Mg Tabs (Rosuvastatin Calcium) .... Take One Tablet By Mouth Daily. 4)  Zetia 10 Mg Tabs (Ezetimibe) .Marland Kitchen.. 1 Tablet By Mouth At Bedtime 5)  Apidra 100 Unit/ml Soln (Insulin Glulisine) .Marland KitchenMarland KitchenMarland Kitchen 175  Units Daily 6)  Calcium 600/vitamin D 600-400 Mg-Unit Tabs (Calcium Carbonate-Vitamin D) .Marland Kitchen.. 1 Tablet By Mouth Two Times A Day 7)  Vitamin D 1000 Unit Tabs (Cholecalciferol) .Marland Kitchen.. 1 Tablet By Mouth Once Daily 8)  Multivitamins   Tabs (Multiple Vitamin) .... One Tablet By Mouth Two Times A Day 9)  Benefiber  Tabs (Wheat Dextrin) .... 2 By Mouth Once Daily 10)  Furosemide 40 Mg Tabs (Furosemide) .... Take 1 1/2  Tablets  By Mouth Once Daily 11)  Tums 500 Mg Chew (Calcium Carbonate Antacid) .... As Needed 12)  Nitroglycerin 0.4 Mg Subl (Nitroglycerin) .... One Tablet Under Tongue Every 5 Minutes As Needed For Chest Pain---May Repeat Times Three 13)  Alprazolam 0.5 Mg Tabs (Alprazolam) .... Take 1-1.5 Tablets By Mouth At Bedtime For Sleep 14)  Tramadol Hcl 50 Mg Tabs (Tramadol Hcl) .... Take As Needed For Pain 15)  Metoprolol Tartrate 25 Mg Tabs (Metoprolol Tartrate) .... Take 1 Tab Two Times A Day 16)  Vitamin C Cr 500 Mg Cr-Tabs (Ascorbic Acid) .... Take 1 Tab Daily 17)  Zegerid 40-1100 Mg Caps (Omeprazole-Sodium Bicarbonate) .Marland Kitchen.. 1 By Mouth Once Daily 18)  Vagifem 10 Mcg Tabs (Estradiol) .Marland Kitchen.. 1 Tab Vaginally At Bedtime For 2 Weeks 19)  Coq10 100 Mg Caps (Coenzyme Q10) .... Take 1 Tab Daily 20)  Omega-3 Krill Oil 300 Mg Caps (Krill Oil) .... Take 1 Cap Two Times A Day 21)  Colace 100 Mg Caps (Docusate Sodium) .... Two Tablets By Mouth in  The Morning 22)  Furosemide 40 Mg Tabs (Furosemide) .... Take One Tablet By Mouth Daily As Needed On Days When Wt Is Increased 3-4 Lbs 23)  Potassium Chloride Crys Cr 20 Meq Cr-Tabs (Potassium Chloride Crys Cr) .... Take 1 Tablet By Mouth Once A Day  Allergies (verified): 1)  ! Cleocin 2)  ! Penicillin 3)  ! Codeine 4)  ! Erythromycin 5)  ! Keflex 6)  ! Lipitor 7)  ! Zocor 8)  ! * Avandia 9)  ! * Metformin  Past History:  PMH, FH, and Social History reviewed and updated.  Review of Systems       The patient complains of chest pain and dyspnea on  exertion.  The patient denies anorexia, weight loss, weight gain, hoarseness, syncope, peripheral edema, prolonged cough, headaches, hemoptysis, and abdominal pain.    Vital Signs:  Patient profile:   60 year old female Weight:      122 pounds Pulse rate:   96 / minute BP sitting:   106 / 63  (right arm)  Vitals Entered By: Doretha Sou, CNA (November 17, 2009 11:39 AM)  Physical Exam  General:  Trim; well developed; no acute distress: Weight-122;3  pound more than at her last visit.   Neck-No JVD; no carotid bruits, but transmitted murmur present Lungs-No tachypnea, no rales; no rhonchi; no wheezes: Cardiovascular-normal PMI; normal S1 and S2; grade 2/6 basilar systolic ejection murmur. Abdomen-BS normal; soft and non-tender without masses or organomegaly:  Musculoskeletal-No deformities, no cyanosis or clubbing: Neurologic-Normal cranial nerves; symmetric strength and tone:  Skin-Warm, no significant lesions: Extremities-Nl distal pulses; no edema:   Impression & Recommendations:  Problem # 1:  CONGESTIVE HEART FAILURE WITH NORMAL EF (ICD-428.32) Symptoms are consistent with orthopnea and PND despite the absence of significant weight gain, peripheral edema, JVD or other signs of congestive heart failure.  A chest x-ray and BNP level will be obtained.  Her dose of furosemide will be increased to 60 mg q.d. with supplemental doses as needed for weight gain as before.  Due to borderline potassium values despite a high potassium diet, potassium chloride 20 mEq q.d. will be added to her medical regime.  She will continue to monitor weight at home.  Electrolytes and renal function will be monitored.  Problem # 2:  AORTIC STENOSIS (ICD-424.1) She did receive a small prosthetic aortic valve.  The possibility of patient-valve mismatch needs to be considered.  This has not been apparent on postoperative echocardiograms.  Problem # 3:  CORONARY ARTERY DISEASE (ICD-414.00) Chest pain and  dyspnea could reflect myocardial ischemia, but are more likely related to congestive heart failure with normal left ventricular systolic function.  She will use nitroglycerin as needed for symptoms and prior to planned exercise.  [name] [name] is cleared to return to work.  She will continue to participate in the cardiac rehabilitation program and return to see me in one month.  Other Orders: T-Chest x-ray, 2 views ([id number]) Future Orders: T-Basic Metabolic Panel ([id number]) ... 12/01/2009 T-BNP  (B Natriuretic Peptide) [phone]) ... 12/01/2009  EKG  Procedure date:  11/17/2009  Findings:      Rhythm Strip  Normal sinus rhythm at a rate of 95 bpm First degree AV block with a PR interval of 250 ms.   Patient Instructions: 1)  Your physician recommends that you schedule a follow-up appointment in:  1 month 2)  Your physician recommends that you return for lab work in: 2 weeks 3)  A chest x-ray  takes a picture of the organs and structures inside the chest, including the heart, lungs, and blood vessels. This test can show several things, including, whether the heart is enlarged; whether fluid is building up in the lungs; and whether pacemaker / defibrillator leads are still in place. 4)  Your physician has recommended you make the following change in your medication: increase lasix to 65m daily, continue to add 415mdaily as needed for increased wt of 3-4 lbs , 5)  start potassium 201mby mouth once daily Prescriptions: POTASSIUM CHLORIDE CRYS CR 20 MEQ CR-TABS (POTASSIUM CHLORIDE CRYS CR) Take 1 tablet by mouth once a day  #30 x 3   Entered by:   TamTye Savoy   Authorized by:   RobYehuda SavannahD, FACRehabilitation Hospital Of Indiana IncSigned by:   TamTye Savoy on 11/17/2009   Method used:   Electronically to        [facility])       [address]    [place]  [id number]    Ph: [phone]    Fax: [phone]:    [id number] 40 MG TABS (FUROSEMIDE) take 1 1/2  tablets  by mouth once daily  #60 x 3   Entered by:   TamTye Savoy   Authorized by:   RobYehuda SavannahD, FACGailey Eye Surgery DecaturSigned by:   TamTye Savoy on 11/17/2009   Method used:   Electronically to        [facility]       [address]    [place]  [id number]    Ph: [phone]    Fax: [phone]:   [id number]

## 2010-08-23 NOTE — Discharge Summary (Signed)
NAME:  [name] [name], [name] [name]             ACCOUNT NO.:  [id number]  MEDICAL RECORD NO.:  [id number]          PATIENT TYPE:  INP  LOCATION:  3893                         FACILITY[facility]  [facility]  PHYSICIAN:  Nat Math, MD      DATE OF BIRTH:  03-04-1951  DATE OF ADMISSION:  08/08/2010 DATE OF DISCHARGE:  08/10/2010                        DISCHARGE SUMMARY - REFERRING   PRIMARY CARE PHYSICIAN:  Dr. [name]  GASTROENTEROLOGIST:  Dr. Silvano Rusk  CARDIOLOGIST:  Dr. Hyman Hopes  CONSULTING PHYSICIANS: 1. Dr. Velora Heckler, gastroenterology 2. Dr. [name]  DISCHARGE DIAGNOSES: 1. Severe nausea and vomiting with severe dehydration and prerenal     azotemia. 2. Gastritis versus gastroenteritis. 3. Possibility of gallbladder colic. 4. History of coronary artery disease, status post coronary artery     bypass graft and aortic valve replacement and aortic valve     replacement. 5. Hypertension. 6. Hyperlipidemia. 7. Diabetes mellitus on insulin pump. 8. Renal cell carcinoma, status post right nephrectomy. 9. History of constrictive pericarditis. 10.History of Hodgkin's lymphoma, status post surgery and radiation. 11.History of irritable bowel syndrome. 12.Cholelithiasis.  DISCHARGE MEDICATIONS:  Include - 1. Prilosec 20 mg daily. 2. Alprazolam 0.25 mg at bedtime. 3. Apidra pump from the patient's endocrinologist. 4. Aspirin 81 mg daily. 5. Benefiber one tablet twice daily. 6. Calcium carbonate one tablet twice daily. 7. Crestor 10 mg daily. 8. Coenzyme Q10 one capsule daily. 9. Lasix 80 mg daily. 10.Januvia 100 mg daily. 11.Lopressor 25 mg twice daily. 12.Multivitamins one tablet daily. 13.Nitroglycerin 0.4 mg sublingually as needed. 14.Omega 3 oil one capsule daily. 15.KCL 40 mEq daily while on Lasix. 16.Fenofibrate 145 mg at bedtime. 17.Vitamin C 500 mg daily. 18.Vitamin D 1000 units daily. 19.Zetia 10 mg daily. 20.Zovirax 5% Ointment on cold sores topically daily as  needed.  PROCEDURES PERFORMED: 1. CT abdomen and pelvis on January 17 showed stable appearance of     abdomen and pelvis.  No acute findings.  Stable internal oblique     hernia defect in the right lower quadrant and cholelithiasis. 2. Abdominal ultrasound on January 17 showed no acute findings.  HOSPITAL COURSE:  This pleasant 60 year old female came in with complaints of abdominal pain, intractable nausea and vomiting not preceded by diarrhea or fever.  Nobody else was sick at home.  At the time of admission, the patient was found to have white count of 12.8 with 94% neutrophils.  She also had BUN of 32, creatinine 1.07.  Lactic acid 2.5.  The patient was then admitted for IV fluids and observation. She responded to antiemetics and IV fluids, and today her BUN is 21, creatinine 1.1.  WBC is 7.  She did not require any antibiotics.  Her blood sugars were in the 200s to 300 range at the time of admission. The patient states that she has been working with an endocrinologist to better control the sugars.  In fact during the hospitalization, the sugars have been in the normal range.  The cause of the nausea and vomiting was not clear.  It is possibly secondary to gastroenteritis versus gastritis versus possibility of gallbladder colic related to cholelithiasis.  She was followed  by gastroenterology on this admission. If she has recurrent symptoms, she should be considered for cholecystectomy.  Otherwise, the other change was addition of Prilosec as the patient has apparently had a previous history of gastroesophageal reflux disease.  She should follow with Dr. Ashley Murrain and Dr. [name] as previously scheduled.  The time spent for this discharge preparation is less than 30 minutes.     Nat Math, MD     SR/MEDQ  D:  08/10/2010  T:  08/10/2010  Job:  [id number]  cc:   Leonides Grills, M.D. Fax: King Salmon, MD,FACG [place] [address]  [place], [place] [address]  Electronically Signed by Nat Math  on 08/23/2010 08:37:26 AM

## 2010-08-24 NOTE — Assessment & Plan Note (Signed)
Summary: abdominal pain/[name] [name]    History of Present Illness Visit Type: Follow-up Visit Primary GI MD: [name] [facility] Primary Provider: [name], MD Requesting Provider: n/a Chief Complaint: Nausea and vomiting and epigastric pain that radiates to her back, Pt knows from a previous CT scan that she has gallstones but at that time they didnt bother her, seems to think that this is having to do with her gallstones History of Present Illness:   Patient is followed by Dr. [name] for history of colon polyps, ischemic colitis and gastroparesis. She has multiple medical problems as listed in [place]. Patient presents today with acute onset of nausea and vomiting. Four days ago developed belching and nausea. Then, yesterday began vomiting bilious material. Her blood sugars had been in the 400's. Two days ago she increased her basal rate and blood sugars went into 200's but since all the vomiting sugars have been in 150's. No sick contacts. No fevers. No bowel changes. Her back aches but no abdominal pain.    GI Review of Systems    Reports belching, bloating, nausea, and  vomiting.      Denies abdominal pain, acid reflux, chest pain, dysphagia with liquids, dysphagia with solids, heartburn, loss of appetite, vomiting blood, weight loss, and  weight gain.        Denies anal fissure, black tarry stools, change in bowel habit, constipation, diarrhea, diverticulosis, fecal incontinence, heme positive stool, hemorrhoids, irritable bowel syndrome, jaundice, light color stool, liver problems, rectal bleeding, and  rectal pain.    Current Medications (verified): 1)  Tricor 145 Mg Tabs (Fenofibrate) .Marland Kitchen.. 1 Tablet By Mouth Once Daily 2)  Januvia 100 Mg Tabs (Sitagliptin Phosphate) .Marland Kitchen.. 1 Tablet By Mouth Once Daily 3)  Crestor 10 Mg Tabs (Rosuvastatin Calcium) .... Take One Tablet By Mouth Daily. 4)  Zetia 10 Mg Tabs (Ezetimibe) .Marland Kitchen.. 1 Tablet By Mouth At Bedtime 5)  Apidra 100 Unit/ml Soln (Insulin  Glulisine) .Marland KitchenMarland KitchenMarland Kitchen 175 Units Daily 6)  Calcium 600/vitamin D 600-400 Mg-Unit Tabs (Calcium Carbonate-Vitamin D) .Marland Kitchen.. 1 Tablet By Mouth Two Times A Day 7)  Multivitamins   Tabs (Multiple Vitamin) .... One Tablet By Mouth Daily 8)  Benefiber  Tabs (Wheat Dextrin) .... 2 By Mouth Once Daily 9)  Furosemide 80 Mg Tabs (Furosemide) .... Take 1 Tablet By Mouth By Mouth Once Daily 10)  Tums 500 Mg Chew (Calcium Carbonate Antacid) .... As Needed 11)  Alprazolam 0.5 Mg Tabs (Alprazolam) .... Take 1/2 Tab Daily 12)  Metoprolol Tartrate 25 Mg Tabs (Metoprolol Tartrate) .... Take 1 Tab Two Times A Day 13)  Vitamin C Cr 500 Mg Cr-Tabs (Ascorbic Acid) .... Take 1 Tab Daily 14)  Coq10 100 Mg Caps (Coenzyme Q10) .... Take 2 Tabs Daily 15)  Omega-3 Krill Oil 300 Mg Caps (Krill Oil) .... Take 1 Cap Daily 16)  Colace 100 Mg Caps (Docusate Sodium) .... Two Tablets By Mouth in The Morning 17)  Furosemide 40 Mg Tabs (Furosemide) .... Take One Tablet By Mouth Daily As Needed On Days When Wt Is Increased 3-4 Lbs 18)  Potassium Chloride Crys Cr 20 Meq Cr-Tabs (Potassium Chloride Crys Cr) .... Take 2 Tablets By Mouth Daily 19)  Aspir-Low 81 Mg Tbec (Aspirin) .... Take 1 Tab Daily 20)  Nitrolingual 0.4 Mg/spray Soln (Nitroglycerin) .... One Spray Under Tongue Every 5 Minutes As Needed For Chest Pain---May Repeat Times Three 21)  Zovirax 5 % Oint (Acyclovir) .... Use As Needed 22)  Vitamin D 1000 Unit Tabs (Cholecalciferol) .Marland KitchenMarland KitchenMarland Kitchen  1 By Mouth At Bedtime 23)  Alprazolam 0.5 Mg Tabs (Alprazolam) .... 1/2 By Mouth At Bedtime  Allergies (verified): 1)  ! Cleocin 2)  ! Penicillin 3)  ! Codeine 4)  ! Erythromycin 5)  ! Keflex 6)  ! Lipitor 7)  ! Zocor 8)  ! * Avandia 9)  ! * Metformin  Past History:  Past Medical History: Last updated: 07/28/2010 Exertional chest discomfort and dyspnea; PND AORTIC STENOSIS (ICD-424.1); bicuspid aortic valve; mean gradient of 20 mmHg in 2/10; aortic valve           replacement  surgery(19-mm Edwards pericardial valve) with a redo coronary artery bypass graft procedure      in 07/2009; postoperative Dressler's syndrome CORONARY ARTERY DISEASE (ICD-414.00)-status post RCA saphenous vein graft in 06/1999;       multiple preceding PCIs with restenosis; total obstruction of RCA in 2006 with patent graft History of CHF; recurred following AVR/CABG surgery DYSLIPIDEMIA (ICD-272.4) HYPERTENSION 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 Treated for gastroesophageal reflux disease in the past; ? 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  Family History: Last updated: 05/20/2008 No FH of Colon Cancer: Family History of Breast Cancer:Paternal half sister Lung cancer: Maternal half sister Family History of Ovarian Cancer:maternal half sister Family History of Diabetes: Father, maternal Uncle Family History of Heart Disease: Mother, Father, maternal/paternal Aunts and Uncles  Social History: Last updated: 01/19/2009 Patient has never smoked.  Alcohol Use - no Illicit Drug Use - no Patient does not get regular exercise, has active lifestyle Full Time -  elementary principal Married   Review of Systems       The patient complains of back pain.  The patient denies allergy/sinus, anemia, anxiety-new, arthritis/joint pain, blood in urine, breast changes/lumps, change in vision, confusion, cough, coughing up blood, depression-new, fainting, fatigue, fever, headaches-new, hearing problems, heart murmur, heart rhythm changes, itching, menstrual pain, muscle pains/cramps, night sweats, nosebleeds, pregnancy symptoms, shortness of breath, skin rash, sleeping problems, sore throat, swelling of feet/legs, swollen lymph glands, thirst - excessive , urination - excessive , urination changes/pain, urine leakage, vision changes, and voice change.    Vital Signs:  Patient profile:   60 year old female Height:      63 inches Weight:      125 pounds BMI:     22.22 BSA:     1.59 Temp:     98.3 degrees F oral Pulse rate:   120 / minute Pulse rhythm:   regular BP sitting:   122 / 62  (left arm)  Vitals Entered By: [place] Deborra Medina) (August 08, 2010 10:41 AM)  Physical Exam  General:  Well developed, acutely ill white female. Head:  Normocephalic and atraumatic. Eyes:  Conjunctiva pink, no icterus.  Neck:  no obvious masses  Lungs:  Clear throughout to  auscultation. Heart:  Regular rate and rhythm; no murmurs, rubs,  or bruits. Abdomen:  Abdomen soft, mild epigastric and RUQ tenderness. Nondistended. No obvious masses or hepatomegaly.Normal bowel sounds.  Msk:  Symmetrical with no gross deformities. Normal posture. Extremities:  No palmar erythema, no edema.  Neurologic:  Alert and  oriented x4;  grossly normal neurologically. Skin:  Intact without significant lesions or rashes. Cervical Nodes:  No significant cervical adenopathy. Psych:  Alert and cooperative. Normal mood and affect.   Impression & Recommendations:  Problem # 1:  NAUSEA AND VOMITING (ICD-787.01) Assessment New Nausea and vomiting may be secondary to hyperglycemia.  Biliary disease is possible, she has some back discomfort but denies abdominal pain. Patient  has signs of dehydration with tachycardia, dizzy. She is diabetic with an insulin pump and has multiple medical problems. I contacted [facility] Dr. Sarajane Jews and he will admit the patient. No beds are available so patient will go to ER with ordiers that I have written to get things started (labs, IVF, anti-emetics, etc..). I discussed case with Dr [name], he spoke with the patient as well.   Problem # 2:  CORONARY ARTERY DISEASE (ICD-414.00) Assessment: Comment Only  Problem # 3:  AORTIC STENOSIS-AVR (ICD-424.1) Assessment: Comment Only  Problem # 4:  DIABETES MELLITUS, TYPE II (ICD-250.00) Assessment: Comment Only  Problem # 5:  HYPERTENSION (ICD-401.1) Assessment: Comment Only  Patient Instructions: 1)  Go to [facility] Emergency Department. [facility] will admit you to hospital.

## 2010-08-24 NOTE — Letter (Signed)
Summary: [facility]  [facility]   Imported By: Rise Patience 08/15/2010 09:10:53  _____________________________________________________________________  External Attachment:    Type:   Image     Comment:   External Document

## 2010-08-24 NOTE — Letter (Signed)
Summary: [facility]  [facility]   Imported By: Phillis Knack 08/10/2010 07:17:58  _____________________________________________________________________  External Attachment:    Type:   Image     Comment:   External Document

## 2010-08-24 NOTE — Letter (Signed)
Summary: [facility], general practice at [place]. [address] [place] [address]   Phone: [phone]  Fax: [phone]     July 28, 2010 MRN: [id number]   [name] [name] [place], [place]  [address]      YOUR LAB WORK IS DUE   September 08, 2010  Please go to [facility], located across the street from [facility] on the second floor.  Hours are Monday - Friday 7am until 7:30pm         Saturday 8am until 12noon    __  DO NOT EAT OR DRINK AFTER MIDNIGHT EVENING PRIOR TO LABWORK  _X_ YOUR LABWORK IS NOT FASTING --YOU MAY EAT PRIOR TO LABWORK

## 2010-08-24 NOTE — Miscellaneous (Signed)
Summary: ECHO 05/23/2010  Clinical Lists Changes  Observations: Added new observation of ECHOINTERP:  Study Conclusions    - Stress ECG conclusions: There were no stress arrhythmias or     conduction abnormalities. The stress ECG was negative for ischemia     with 1-2 mm of upsloping ST segment depression during exercise and     for 3 minutes into recovery.   - Staged echo: There was no echocardiographic evidence for     stress-induced ischemia.   Bruce protocol. Stress echocardiography. Height: Height: 157.5cm.   Height: 62in. Weight: Weight: 55.9kg. Weight: 123lb. Body mass   index: BMI: 22.5kg/m^2. Body surface area: BSA: 1.5m2. Blood   pressure: 128/68. Patient status: Outpatient.    --------------------------------------------------------------------  (05/23/2010 8:34)      Echocardiogram  Procedure date:  05/23/2010  Findings:       Study Conclusions    - Stress ECG conclusions: There were no stress arrhythmias or     conduction abnormalities. The stress ECG was negative for ischemia     with 1-2 mm of upsloping ST segment depression during exercise and     for 3 minutes into recovery.   - Staged echo: There was no echocardiographic evidence for     stress-induced ischemia.   Bruce protocol. Stress echocardiography. Height: Height: 157.5cm.   Height: 62in. Weight: Weight: 55.9kg. Weight: 123lb. Body mass   index: BMI: 22.5kg/m^2. Body surface area: BSA: 1.528m. Blood   pressure: 128/68. Patient status: Outpatient.    --------------------------------------------------------------------

## 2010-08-24 NOTE — Assessment & Plan Note (Addendum)
Summary: 3 mth f/u per checkout on 04/11/10/tg  Medications Added ZOVIRAX 5 % OINT (ACYCLOVIR) use as needed      Allergies Added:   Visit Type:  Follow-up Referring Provider:  Dr. Elyse Hsu Primary Provider:  [name], MD   History of Present Illness: [name] returns to the office for continued assessment and treatment of presumed diastolic dysfunction following aortic valve replacement surgery.  Since her last visit, she has done well from a cardiac standpoint.  She has been able to perform her usual activities, generally without symptoms.  She developed pedal edema on only one occasion prompting her to take an extra dose of furosemide and orthopnea only once prompting her to add a few pillows behind her back.  She has had localized and brief episodes of chest discomfort for which evaluation was undertaken by her gynecologist.  The radiologist's conclusion was that she was suffering from costochondritis.  She tried to take Aleve 200 mg b.i.d., but developed nausea and emesis.  Symptoms are now fading.  She has also had poor control of diabetes with hemoglobin A1c exceed 8 for the first time in recent months.  She is to obtain a new infusion device and to adjust insulin dosage to achieve better control.  She has also noticed an increase in triglycerides and decreased HDL on routine laboratories.  Patient also complains of joint discomfort, stiffness and difficulty in moving normally.  I asked her to sit on the floor and then arise to a standing position.  She did have substantial problems with this.  Her dose of rosuvastatin was decreased a few weeks ago with some improvement.  She is scheduled for initial evaluation by a rheumatologist next week.  Current Medications (verified): 1)  Tricor 145 Mg Tabs (Fenofibrate) .Marland Kitchen.. 1 Tablet By Mouth Once Daily 2)  Januvia 100 Mg Tabs (Sitagliptin Phosphate) .Marland Kitchen.. 1 Tablet By Mouth Once Daily 3)  Crestor 10 Mg Tabs (Rosuvastatin  Calcium) .... Take One Tablet By Mouth Daily. 4)  Zetia 10 Mg Tabs (Ezetimibe) .Marland Kitchen.. 1 Tablet By Mouth At Bedtime 5)  Apidra 100 Unit/ml Soln (Insulin Glulisine) .Marland KitchenMarland KitchenMarland Kitchen 175 Units Daily 6)  Calcium 600/vitamin D 600-400 Mg-Unit Tabs (Calcium Carbonate-Vitamin D) .Marland Kitchen.. 1 Tablet By Mouth Two Times A Day 7)  Multivitamins   Tabs (Multiple Vitamin) .... One Tablet By Mouth Daily 8)  Benefiber  Tabs (Wheat Dextrin) .... 2 By Mouth Once Daily 9)  Furosemide 80 Mg Tabs (Furosemide) .... Take 1 Tablet By Mouth By Mouth Once Daily 10)  Tums 500 Mg Chew (Calcium Carbonate Antacid) .... As Needed 11)  Alprazolam 0.5 Mg Tabs (Alprazolam) .... Take 1/2 Tab Daily 12)  Metoprolol Tartrate 25 Mg Tabs (Metoprolol Tartrate) .... Take 1 Tab Two Times A Day 13)  Vitamin C Cr 500 Mg Cr-Tabs (Ascorbic Acid) .... Take 1 Tab Daily 14)  Coq10 100 Mg Caps (Coenzyme Q10) .... Take 2 Tabs Daily 15)  Omega-3 Krill Oil 300 Mg Caps (Krill Oil) .... Take 1 Cap Daily 16)  Colace 100 Mg Caps (Docusate Sodium) .... Two Tablets By Mouth in The Morning 17)  Furosemide 40 Mg Tabs (Furosemide) .... Take One Tablet By Mouth Daily As Needed On Days When Wt Is Increased 3-4 Lbs 18)  Potassium Chloride Crys Cr 20 Meq Cr-Tabs (Potassium Chloride Crys Cr) .... Take 2 Tablets By Mouth Daily 19)  Aspir-Low 81 Mg Tbec (Aspirin) .... Take 1 Tab Daily 20)  Nitrolingual 0.4 Mg/spray Soln (Nitroglycerin) .... One Spray Under  Tongue Every 5 Minutes As Needed For Chest Pain---May Repeat Times Three 21)  Zovirax 5 % Oint (Acyclovir) .... Use As Needed  Allergies (verified): 1)  ! Cleocin 2)  ! Penicillin 3)  ! Codeine 4)  ! Erythromycin 5)  ! Keflex 6)  ! Lipitor 7)  ! Zocor 8)  ! * Avandia 9)  ! * Metformin  Comments:  Nurse/Medical Assistant: patient and i reviewed  meds   Past History:  PMH, FH, and Social History reviewed and updated.  Past Medical History: Exertional chest discomfort and dyspnea; PND AORTIC STENOSIS (ICD-424.1);  bicuspid aortic valve; mean gradient of 20 mmHg in 2/10; aortic valve           replacement surgery(19-mm Edwards pericardial valve) with a redo coronary artery bypass graft procedure      in 07/2009; postoperative Dressler's syndrome CORONARY ARTERY DISEASE (ICD-414.00)-status post RCA saphenous vein graft in 06/1999;       multiple preceding PCIs with restenosis; total obstruction of RCA in 2006 with patent graft History of CHF; recurred following AVR/CABG surgery DYSLIPIDEMIA (ICD-272.4) HYPERTENSION 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 Treated for gastroesophageal reflux disease in the past; ? 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  Vital Signs:  Patient profile:   60 year old female Weight:      127 pounds BMI:     22.58 O2 Sat:      96 % on Room air Pulse rate:   90 / minute BP sitting:   124 / 71  (left arm)  Vitals Entered By: Doretha Sou, CNA (July 28, 2010 2:54 PM)  O2 Flow:  Room air  Physical Exam  General:  Trim; well developed; no acute distress: Weight-127, representing a 4 pound increase   Neck-No JVD; no carotid bruits, but faint transmitted murmur present Lungs-No tachypnea, no rales; no rhonchi; no wheezes; decreased breath sounds at the right base Cardiovascular-normal PMI; normal S1 and increased S2; grade 2/6 basilar early systolic ejection murmur at the cardiac base Abdomen-BS normal; soft and non-tender without masses or organomegaly:    Neurologic-Normal cranial nerves; symmetric strength and tone:  Skin-Warm, no significant lesions: Extremities-Nl distal pulses; trace ankle edema on the left   Impression & Recommendations:  Problem # 1:  CORONARY ARTERY DISEASE (ICD-414.00) Patient has had no trouble with coronary disease since her initial bypass surgery.  On sequential catheterization she has had no progression of disease in other vessels.  As long as her current graft remains patent, I doubt she will have additional problems with CAD.  Problem # 2:  AORTIC STENOSIS-AVR (ICD-424.1) Valve function was normal on a postoperative echocardiogram.  [place] likely has LVH and diastolic dysfunction.  Control is good with current dose of diuretic.  She attributes her weight gain to excessive caloric intake during the holidays and will call for a further increase of 3 pounds.  Problem # 3:  HYPERTENSION (ICD-401.1) Blood pressure has been easy to control and remains good with current medical therapy.  Problem # 4:  HYPERLIPIDEMIA (ICD-272.4)  Increase in triglycerides and decrease in HDL likely reflects loss of control of diabetes as well as a decrease in her dose of rosuvastatin.  Current level of control is acceptable, particularly since her coronary disease was likely related to previous radiation therapy and not ASCVD.  CPK will be checked to exclude statin-induced myositis.  A chemistry profile will also be obtained.  I will reassess this nice woman in 10 weeks.  Other Orders: Future Orders: T-Basic Metabolic Panel ([id number]) ... 09/08/2010 T-CK Total [phone]) ... 09/08/2010  Patient Instructions: 1)  Your physician recommends that you schedule a follow-up appointment in: [place] 2)  Your physician recommends that you return for lab work in: 6 WEEKS 3)  Your physician recommends that you weigh, daily, at the same time every day, and in the same amount of clothing.  Please record your daily weights on the handout  provided and bring it to your next appointment. CALL FOR 3 LBS ADDITONAL WT GAIN  Appended Document: Report of Urology followup and CT scan of chest and abdomen    Clinical Lists Changes  Observations: Added new observation of REFERRING MD: GI-Dr. Christophe Louis; Endocrine-Dr. Altheimer; Urology-Dr. Alinda Money (08/20/2010 12:23) Added new observation of PRIMARY MD: [name], MD (08/20/2010 12:23) Added new observation of CT SCAN: CT of Chest and Abdomen with contrast  New and extremely small right pleural effusion No other intrathoracic abnormality No retroperitoneal lymphadenopathy Cholelithiasis (07/25/2010 12:26)        CT Scan  Procedure date:  07/25/2010  Findings:      CT of Chest and Abdomen with contrast  New and extremely small right pleural effusion No other intrathoracic abnormality No retroperitoneal lymphadenopathy Cholelithiasis

## 2010-08-24 NOTE — Progress Notes (Signed)
Summary: triage   Phone Note Call from Patient Call back at Work Phone [phone]   Caller: Patient Call For: Dr [name] Reason for Call: Talk to Nurse Summary of Call: Patient has nausea, vomitting,  pain under her ribs since Saturday wants to be see sooner than first available 2-10. Initial call taken by: Ronalee Red,  August 08, 2010 8:31 AM  Follow-up for Phone Call        Patient has had epigastric pain that radiates into her back. Pain started on Saturday.  She has tried omeprazole but this is not helping.  Severe nausea since Saturday vomiitng began this am.  She states she can't sit still.  Has tried Tylenol for the pain , but unable to keep it down.  I had scheduled her to see Dr [name] on Friday, but I have rescheduled her for today with Tye Savoy RNP at 10:30 Follow-up by: Barb Merino RN, CGRN,  August 08, 2010 9:54 AM

## 2010-08-24 NOTE — Letter (Signed)
Summary: DR Elyse Hsu RECORDS  DR [facility]   Imported By: [place] Crane 07/31/2010 11:30:02  _____________________________________________________________________  External Attachment:    Type:   Image     Comment:   External Document  Appended Document: DR ALTHEIMER RECORDS    Clinical Lists Changes  Observations: Added new observation of LDL: 65 mg/dL (07/25/2010 15:32) Added new observation of HDL: 37 mg/dL (07/25/2010 15:32) Added new observation of TRIGLYC TOT: 120 mg/dL (07/25/2010 15:32) Added new observation of CHOLESTEROL: 124 mg/dL (07/25/2010 15:32) Added new observation of PLATELETK/UL: 183 K/uL (07/25/2010 15:32) Added new observation of MCV: 86 fL (07/25/2010 15:32) Added new observation of HCT: 36.2 % (07/25/2010 15:32) Added new observation of HGB: 11.5 g/dL (07/25/2010 15:32) Added new observation of TSH: 1.66 microintl units/mL (07/25/2010 15:32) Added new observation of HGBA1C: 8.6 % (07/25/2010 15:32)

## 2010-08-24 NOTE — Miscellaneous (Signed)
Summary: CMP, BUN, Creatinine  Clinical Lists Changes  Observations: Added new observation of CALCIUM: 10.1 mg/dL (07/25/2010 10:24) Added new observation of ALBUMIN: 4.9 g/dL (07/25/2010 10:24) Added new observation of PROTEIN, TOT: 8.1 g/dL (07/25/2010 10:24) Added new observation of SGPT (ALT): 19 units/L (07/25/2010 10:24) Added new observation of SGOT (AST): 23 units/L (07/25/2010 10:24) Added new observation of ALK PHOS: 87 units/L (07/25/2010 10:24) Added new observation of BILI DIRECT: Total Bili 0.4 mg/dL (07/25/2010 10:24) Added new observation of GFR AA: >60 mL/min/1.72m (07/25/2010 10:24) Added new observation of GFR: 51 mL/min (07/25/2010 10:24) Added new observation of CREATININE: 1.10 mg/dL (07/25/2010 10:24) Added new observation of BUN: 43 mg/dL (07/25/2010 10:24) Added new observation of BG RANDOM: 242 mg/dL (07/25/2010 10:24) Added new observation of CO2 PLSM/SER: 22 meq/L (07/25/2010 10:24) Added new observation of CL SERUM: 106 meq/L (07/25/2010 10:24) Added new observation of K SERUM: 4.2 meq/L (07/25/2010 10:24) Added new observation of NA: 141 meq/L (07/25/2010 10:24)

## 2010-08-30 NOTE — Letter (Signed)
Summary: [facility]  [facility]   Imported By: [place] Crane 08/21/2010 15:53:55  _____________________________________________________________________  External Attachment:    Type:   Image     Comment:   External Document

## 2010-09-14 ENCOUNTER — Encounter: Payer: Self-pay | Admitting: *Deleted

## 2010-09-14 LAB — CONVERTED CEMR LAB
BUN: 39 mg/dL — ABNORMAL HIGH (ref 6–23)
CO2: 28 meq/L
Chloride: 97 meq/L
Creatinine, Ser: 1.16 mg/dL (ref 0.40–1.20)
Glucose, Bld: 242 mg/dL
Sodium: 138 meq/L
Sodium: 138 meq/L (ref 135–145)

## 2010-09-28 ENCOUNTER — Telehealth: Payer: Self-pay | Admitting: Internal Medicine

## 2010-10-03 NOTE — Progress Notes (Signed)
Summary: Medication refill  Medications Added OMEPRAZOLE 20 MG CPDR (OMEPRAZOLE) take one capsule 30 minutes before breakfast       Phone Note From Pharmacy   Caller: [facility] [phone] Call For: Dr. [name]  Summary of Call: Needs a refill on Omeprazole Initial call taken by: Webb Laws,  September 28, 2010 9:16 AM  Follow-up for Phone Call        Pt was given Omeprazole in the hospital and is requesting a refill. Do you want to give it to patient? We never gave Omeprazole in the office   Follow-up by: Hope Pigeon CMA,  September 28, 2010 10:48 AM  Additional Follow-up for Phone Call Additional follow up Details #1::        please clarify the strength and dosing, I think it is omeprazole 20 mg daily. once I know will take care of it - route back to me Gatha Mayer MD, [facility]  September 28, 2010 11:58 AM     Additional Follow-up for Phone Call Additional follow up Details #2::    it is 94m once daily  Follow-up by: KHope PigeonCMA,  September 28, 2010 12:04 PM  New/Updated Medications: OMEPRAZOLE 20 MG CPDR (OMEPRAZOLE) take one capsule 30 minutes before breakfast Prescriptions: OMEPRAZOLE 20 MG CPDR (OMEPRAZOLE) take one capsule 30 minutes before breakfast  #30 x 11   Entered and Authorized by:   CGatha MayerMD, [facility]  Signed by:   CGatha MayerMD, [facility] 09[facility]28/2010   Method used:   Electronically to        [facility]       [address]      [place]  [address]      Ph: [phone]      Fax: [phone]  RxID:   [id number]

## 2010-10-04 ENCOUNTER — Encounter: Payer: Self-pay | Admitting: *Deleted

## 2010-10-05 ENCOUNTER — Ambulatory Visit (INDEPENDENT_AMBULATORY_CARE_PROVIDER_SITE_OTHER): Payer: BC Managed Care – PPO | Admitting: Cardiology

## 2010-10-05 ENCOUNTER — Encounter: Payer: Self-pay | Admitting: Cardiology

## 2010-10-05 DIAGNOSIS — I359 Nonrheumatic aortic valve disorder, unspecified: Secondary | ICD-10-CM

## 2010-10-08 LAB — GLUCOSE, CAPILLARY
Glucose-Capillary: 100 mg/dL — ABNORMAL HIGH (ref 70–99)
Glucose-Capillary: 107 mg/dL — ABNORMAL HIGH (ref 70–99)
Glucose-Capillary: 131 mg/dL — ABNORMAL HIGH (ref 70–99)
Glucose-Capillary: 131 mg/dL — ABNORMAL HIGH (ref 70–99)
Glucose-Capillary: 136 mg/dL — ABNORMAL HIGH (ref 70–99)
Glucose-Capillary: 137 mg/dL — ABNORMAL HIGH (ref 70–99)
Glucose-Capillary: 137 mg/dL — ABNORMAL HIGH (ref 70–99)
Glucose-Capillary: 144 mg/dL — ABNORMAL HIGH (ref 70–99)
Glucose-Capillary: 145 mg/dL — ABNORMAL HIGH (ref 70–99)
Glucose-Capillary: 146 mg/dL — ABNORMAL HIGH (ref 70–99)
Glucose-Capillary: 152 mg/dL — ABNORMAL HIGH (ref 70–99)
Glucose-Capillary: 157 mg/dL — ABNORMAL HIGH (ref 70–99)
Glucose-Capillary: 158 mg/dL — ABNORMAL HIGH (ref 70–99)
Glucose-Capillary: 160 mg/dL — ABNORMAL HIGH (ref 70–99)
Glucose-Capillary: 167 mg/dL — ABNORMAL HIGH (ref 70–99)
Glucose-Capillary: 167 mg/dL — ABNORMAL HIGH (ref 70–99)
Glucose-Capillary: 169 mg/dL — ABNORMAL HIGH (ref 70–99)
Glucose-Capillary: 173 mg/dL — ABNORMAL HIGH (ref 70–99)
Glucose-Capillary: 173 mg/dL — ABNORMAL HIGH (ref 70–99)
Glucose-Capillary: 191 mg/dL — ABNORMAL HIGH (ref 70–99)
Glucose-Capillary: 207 mg/dL — ABNORMAL HIGH (ref 70–99)
Glucose-Capillary: 207 mg/dL — ABNORMAL HIGH (ref 70–99)
Glucose-Capillary: 220 mg/dL — ABNORMAL HIGH (ref 70–99)
Glucose-Capillary: 94 mg/dL (ref 70–99)
Glucose-Capillary: 94 mg/dL (ref 70–99)
Glucose-Capillary: 97 mg/dL (ref 70–99)

## 2010-10-08 LAB — CBC
HCT: 27.3 % — ABNORMAL LOW (ref 36.0–46.0)
HCT: 29.2 % — ABNORMAL LOW (ref 36.0–46.0)
HCT: 36.8 % (ref 36.0–46.0)
Hemoglobin: 10 g/dL — ABNORMAL LOW (ref 12.0–15.0)
Hemoglobin: 9.2 g/dL — ABNORMAL LOW (ref 12.0–15.0)
Hemoglobin: 9.4 g/dL — ABNORMAL LOW (ref 12.0–15.0)
MCHC: 33.6 g/dL (ref 30.0–36.0)
MCHC: 33.8 g/dL (ref 30.0–36.0)
MCHC: 34.1 g/dL (ref 30.0–36.0)
MCHC: 34.4 g/dL (ref 30.0–36.0)
MCV: 88.1 fL (ref 78.0–100.0)
MCV: 88.4 fL (ref 78.0–100.0)
MCV: 88.7 fL (ref 78.0–100.0)
MCV: 89.1 fL (ref 78.0–100.0)
MCV: 89.3 fL (ref 78.0–100.0)
MCV: 89.6 fL (ref 78.0–100.0)
Platelets: 105 10*3/uL — ABNORMAL LOW (ref 150–400)
Platelets: 109 10*3/uL — ABNORMAL LOW (ref 150–400)
Platelets: 117 10*3/uL — ABNORMAL LOW (ref 150–400)
Platelets: 120 10*3/uL — ABNORMAL LOW (ref 150–400)
RBC: 3.03 MIL/uL — ABNORMAL LOW (ref 3.87–5.11)
RBC: 3.06 MIL/uL — ABNORMAL LOW (ref 3.87–5.11)
RBC: 3.09 MIL/uL — ABNORMAL LOW (ref 3.87–5.11)
RBC: 3.1 MIL/uL — ABNORMAL LOW (ref 3.87–5.11)
RBC: 3.3 MIL/uL — ABNORMAL LOW (ref 3.87–5.11)
RBC: 4.14 MIL/uL (ref 3.87–5.11)
RDW: 14.5 % (ref 11.5–15.5)
RDW: 15.7 % — ABNORMAL HIGH (ref 11.5–15.5)
WBC: 10.5 10*3/uL (ref 4.0–10.5)
WBC: 12.4 10*3/uL — ABNORMAL HIGH (ref 4.0–10.5)
WBC: 12.5 10*3/uL — ABNORMAL HIGH (ref 4.0–10.5)
WBC: 13.4 10*3/uL — ABNORMAL HIGH (ref 4.0–10.5)
WBC: 14 10*3/uL — ABNORMAL HIGH (ref 4.0–10.5)
WBC: 8.7 10*3/uL (ref 4.0–10.5)

## 2010-10-08 LAB — POCT I-STAT 4, (NA,K, GLUC, HGB,HCT)
Glucose, Bld: 184 mg/dL — ABNORMAL HIGH (ref 70–99)
Glucose, Bld: 215 mg/dL — ABNORMAL HIGH (ref 70–99)
Glucose, Bld: 231 mg/dL — ABNORMAL HIGH (ref 70–99)
Glucose, Bld: 243 mg/dL — ABNORMAL HIGH (ref 70–99)
HCT: 20 % — ABNORMAL LOW (ref 36.0–46.0)
HCT: 23 % — ABNORMAL LOW (ref 36.0–46.0)
HCT: 28 % — ABNORMAL LOW (ref 36.0–46.0)
Hemoglobin: 7.5 g/dL — ABNORMAL LOW (ref 12.0–15.0)
Hemoglobin: 7.8 g/dL — ABNORMAL LOW (ref 12.0–15.0)
Hemoglobin: 9.5 g/dL — ABNORMAL LOW (ref 12.0–15.0)
Potassium: 4.7 mEq/L (ref 3.5–5.1)
Potassium: 4.8 mEq/L (ref 3.5–5.1)
Potassium: 6.1 mEq/L — ABNORMAL HIGH (ref 3.5–5.1)
Potassium: 6.6 mEq/L (ref 3.5–5.1)
Sodium: 135 mEq/L (ref 135–145)
Sodium: 137 mEq/L (ref 135–145)
Sodium: 139 mEq/L (ref 135–145)

## 2010-10-08 LAB — URINALYSIS, ROUTINE W REFLEX MICROSCOPIC
Bilirubin Urine: NEGATIVE
Glucose, UA: 500 mg/dL — AB
Glucose, UA: >1000 mg/dL — AB
Hgb urine dipstick: NEGATIVE
Ketones, ur: 15 mg/dL — AB
Ketones, ur: NEGATIVE mg/dL
Nitrite: NEGATIVE
Nitrite: NEGATIVE
Protein, ur: NEGATIVE mg/dL
Protein, ur: NEGATIVE mg/dL
Specific Gravity, Urine: 1.025 (ref 1.005–1.030)
Urobilinogen, UA: 0.2 mg/dL (ref 0.0–1.0)
Urobilinogen, UA: 0.2 mg/dL (ref 0.0–1.0)
Urobilinogen, UA: 1 mg/dL (ref 0.0–1.0)
pH: 6 (ref 5.0–8.0)

## 2010-10-08 LAB — BASIC METABOLIC PANEL
CO2: 24 mEq/L (ref 19–32)
CO2: 25 mEq/L (ref 19–32)
CO2: 26 mEq/L (ref 19–32)
Calcium: 7.8 mg/dL — ABNORMAL LOW (ref 8.4–10.5)
Calcium: 8.1 mg/dL — ABNORMAL LOW (ref 8.4–10.5)
Calcium: 8.5 mg/dL (ref 8.4–10.5)
Calcium: 9 mg/dL (ref 8.4–10.5)
Chloride: 102 mEq/L (ref 96–112)
Chloride: 105 mEq/L (ref 96–112)
Chloride: 111 mEq/L (ref 96–112)
Creatinine, Ser: 1.53 mg/dL — ABNORMAL HIGH (ref 0.4–1.2)
Creatinine, Ser: 1.6 mg/dL — ABNORMAL HIGH (ref 0.4–1.2)
Creatinine, Ser: 2.06 mg/dL — ABNORMAL HIGH (ref 0.4–1.2)
GFR calc Af Amer: 30 mL/min — ABNORMAL LOW (ref >60)
GFR calc Af Amer: 40 mL/min — ABNORMAL LOW (ref >60)
GFR calc Af Amer: 42 mL/min — ABNORMAL LOW (ref >60)
GFR calc Af Amer: 55 mL/min — ABNORMAL LOW (ref >60)
GFR calc Af Amer: 58 mL/min — ABNORMAL LOW (ref >60)
GFR calc Af Amer: >60 mL/min (ref >60)
GFR calc non Af Amer: 25 mL/min — ABNORMAL LOW (ref >60)
GFR calc non Af Amer: 35 mL/min — ABNORMAL LOW (ref >60)
GFR calc non Af Amer: 59 mL/min — ABNORMAL LOW (ref >60)
Glucose, Bld: 150 mg/dL — ABNORMAL HIGH (ref 70–99)
Glucose, Bld: 211 mg/dL — ABNORMAL HIGH (ref 70–99)
Glucose, Bld: 99 mg/dL (ref 70–99)
Potassium: 3.9 mEq/L (ref 3.5–5.1)
Potassium: 3.9 mEq/L (ref 3.5–5.1)
Potassium: 3.9 mEq/L (ref 3.5–5.1)
Potassium: 4.1 mEq/L (ref 3.5–5.1)
Sodium: 135 mEq/L (ref 135–145)
Sodium: 140 mEq/L (ref 135–145)
Sodium: 142 mEq/L (ref 135–145)

## 2010-10-08 LAB — COMPREHENSIVE METABOLIC PANEL
AST: 34 U/L (ref 0–37)
BUN: 29 mg/dL — ABNORMAL HIGH (ref 6–23)
CO2: 26 mEq/L (ref 19–32)
Calcium: 10.4 mg/dL (ref 8.4–10.5)
Chloride: 102 mEq/L (ref 96–112)
Creatinine, Ser: 1.5 mg/dL — ABNORMAL HIGH (ref 0.4–1.2)
GFR calc Af Amer: 43 mL/min — ABNORMAL LOW (ref >60)
GFR calc non Af Amer: 36 mL/min — ABNORMAL LOW (ref >60)
Total Bilirubin: 0.4 mg/dL (ref 0.3–1.2)

## 2010-10-08 LAB — POCT I-STAT, CHEM 8
Calcium, Ion: 1.14 mmol/L (ref 1.12–1.32)
Chloride: 110 mEq/L (ref 96–112)
Creatinine, Ser: 1.1 mg/dL (ref 0.4–1.2)
Glucose, Bld: 99 mg/dL (ref 70–99)
HCT: 28 % — ABNORMAL LOW (ref 36.0–46.0)
HCT: 55 % — ABNORMAL HIGH (ref 36.0–46.0)
Hemoglobin: 9.5 g/dL — ABNORMAL LOW (ref 12.0–15.0)
Sodium: 144 mEq/L (ref 135–145)
TCO2: 23 mmol/L (ref 0–100)
TCO2: 25 mmol/L (ref 0–100)

## 2010-10-08 LAB — TYPE AND SCREEN

## 2010-10-08 LAB — POCT I-STAT 3, ART BLOOD GAS (G3+)
Acid-base deficit: 2 mmol/L (ref 0.0–2.0)
Acid-base deficit: 3 mmol/L — ABNORMAL HIGH (ref 0.0–2.0)
Bicarbonate: 21.8 mEq/L (ref 20.0–24.0)
Bicarbonate: 22.6 mEq/L (ref 20.0–24.0)
Bicarbonate: 24.2 mEq/L — ABNORMAL HIGH (ref 20.0–24.0)
O2 Saturation: 100 %
O2 Saturation: 94 %
O2 Saturation: 95 %
Patient temperature: 38.4
TCO2: 23 mmol/L (ref 0–100)
TCO2: 26 mmol/L (ref 0–100)
pCO2 arterial: 45.3 mmHg — ABNORMAL HIGH (ref 35.0–45.0)
pH, Arterial: 7.283 — ABNORMAL LOW (ref 7.350–7.400)
pO2, Arterial: 223 mmHg — ABNORMAL HIGH (ref 80.0–100.0)
pO2, Arterial: 75 mmHg — ABNORMAL LOW (ref 80.0–100.0)

## 2010-10-08 LAB — PREPARE CRYOPRECIPITATE

## 2010-10-08 LAB — URINE CULTURE: Colony Count: 30000

## 2010-10-08 LAB — HEMOGLOBIN A1C
Hgb A1c MFr Bld: 8 % — ABNORMAL HIGH (ref 4.6–6.1)
Mean Plasma Glucose: 183 mg/dL

## 2010-10-08 LAB — PREPARE PLATELETS

## 2010-10-08 LAB — PREPARE FRESH FROZEN PLASMA

## 2010-10-08 LAB — PROTIME-INR
INR: 1.02 (ref 0.00–1.49)
INR: 1.49 (ref 0.00–1.49)
INR: 1.85 — ABNORMAL HIGH (ref 0.00–1.49)
Prothrombin Time: 13.3 seconds (ref 11.6–15.2)
Prothrombin Time: 17.9 seconds — ABNORMAL HIGH (ref 11.6–15.2)
Prothrombin Time: 21.2 seconds — ABNORMAL HIGH (ref 11.6–15.2)

## 2010-10-08 LAB — MAGNESIUM
Magnesium: 2.9 mg/dL — ABNORMAL HIGH (ref 1.5–2.5)
Magnesium: 3 mg/dL — ABNORMAL HIGH (ref 1.5–2.5)

## 2010-10-08 LAB — FIBRINOGEN: Fibrinogen: 206 mg/dL (ref 204–475)

## 2010-10-08 LAB — APTT: aPTT: 37 seconds (ref 24–37)

## 2010-10-08 LAB — ABO/RH: ABO/RH(D): O POS

## 2010-10-08 LAB — CREATININE, SERUM
Creatinine, Ser: 1.11 mg/dL (ref 0.4–1.2)
GFR calc Af Amer: >60 mL/min (ref >60)

## 2010-10-08 LAB — CROSSMATCH

## 2010-10-08 LAB — URINE MICROSCOPIC-ADD ON

## 2010-10-08 LAB — MRSA PCR SCREENING: MRSA by PCR: NEGATIVE

## 2010-10-10 NOTE — Miscellaneous (Signed)
Summary: hospital labs 08/08/2010 thru 08/10/2010  Clinical Lists Changes  Observations: Added new observation of RDW: 30.7 % (08/10/2010 15:29) Added new observation of MCHC RBC: 28.2 g/dL (08/10/2010 15:29) Added new observation of MCV: 91.8 fL (08/10/2010 15:29) Added new observation of HCT: 35.8 % (08/10/2010 15:29) Added new observation of HGB: 11.0 g/dL (08/10/2010 15:29) Added new observation of RBC M/UL: 3.90 M/uL (08/10/2010 15:29) Added new observation of WBC COUNT: 7.0 10*3/microliter (08/10/2010 15:29) Added new observation of CALCIUM: 9.2 mg/dL (08/09/2010 15:29) Added new observation of ALBUMIN: 3.4 g/dL (08/09/2010 15:29) Added new observation of PROTEIN, TOT: 6.6 g/dL (08/09/2010 15:29) Added new observation of SGPT (ALT): 26 units/L (08/09/2010 15:29) Added new observation of SGOT (AST): 37 units/L (08/09/2010 15:29) Added new observation of ALK PHOS: 57 units/L (08/09/2010 15:29) Added new observation of GFR AA: >60 mL/min/1.59m (08/09/2010 15:29) Added new observation of GFR: 50 mL/min (08/09/2010 15:29) Added new observation of CREATININE: 1.12 mg/dL (08/09/2010 15:29) Added new observation of BUN: 26 mg/dL (08/09/2010 15:29) Added new observation of BG RANDOM: 122 mg/dL (08/09/2010 15:29) Added new observation of CO2 PLSM/SER: 26 meq/L (08/09/2010 15:29) Added new observation of CL SERUM: 110 meq/L (08/09/2010 15:29) Added new observation of K SERUM: 4.0 meq/L (08/09/2010 15:29) Added new observation of NA: 143 meq/L (08/09/2010 15:29) Added new observation of PLATELETK/UL: 128 K/uL (08/09/2010 15:29) Added new observation of RDW: 15.9 % (08/09/2010 15:29) Added new observation of MCHC RBC: 31.9 g/dL (08/09/2010 15:29) Added new observation of MCV: 89.6 fL (08/09/2010 15:29) Added new observation of HCT: 33.5 % (08/09/2010 15:29) Added new observation of HGB: 10.7 g/dL (08/09/2010 15:29) Added new observation of RBC M/UL: 3.74 M/uL (08/09/2010 15:29) Added new  observation of WBC COUNT: 5.5 10*3/microliter (08/09/2010 15:29) Added new observation of CALCIUM: 10.1 mg/dL (08/08/2010 15:29) Added new observation of ALBUMIN: 4.6 g/dL (08/08/2010 15:29) Added new observation of PROTEIN, TOT: 8.3 g/dL (08/08/2010 15:29) Added new observation of SGPT (ALT): 34 units/L (08/08/2010 15:29) Added new observation of SGOT (AST): 61 units/L (08/08/2010 15:29) Added new observation of ALK PHOS: 72 units/L (08/08/2010 15:29) Added new observation of GFR AA: >60 mL/min/1.715m(08/08/2010 15:29) Added new observation of GFR: 53 mL/min (08/08/2010 15:29) Added new observation of CREATININE: 1.07 mg/dL (08/08/2010 15:29) Added new observation of BUN: 32 mg/dL (08/08/2010 15:29) Added new observation of BG RANDOM: 98 mg/dL (08/08/2010 15:29) Added new observation of CO2 PLSM/SER: 28 meq/L (08/08/2010 15:29) Added new observation of CL SERUM: 103 meq/L (08/08/2010 15:29) Added new observation of K SERUM: 4.8 meq/L (08/08/2010 15:29) Added new observation of NA: 143 meq/L (08/08/2010 15:29) Added new observation of ABSOLUTE BAS: 0.0 K/uL (08/08/2010 15:29) Added new observation of BASOPHIL %: 0 % (08/08/2010 15:29) Added new observation of EOS ABSLT: 0.0 K/uL (08/08/2010 15:29) Added new observation of % EOS AUTO: 0 % (08/08/2010 15:29) Added new observation of ABSOLUTE MON: 0.4 K/uL (08/08/2010 15:29) Added new observation of MONOCYTE %: 3 % (08/08/2010 15:29) Added new observation of ABS LYMPHOCY: 0.4 K/uL (08/08/2010 15:29) Added new observation of LYMPHS %: 94 % (08/08/2010 15:29) Added new observation of ABS NEUTROPH: 12.0 K/uL (08/08/2010 15:29) Added new observation of PLATELETK/UL: 174 K/uL (08/08/2010 15:29) Added new observation of RDW: 15.3 % (08/08/2010 15:29) Added new observation of MCHC RBC: 32.8 g/dL (08/08/2010 15:29) Added new observation of MCV: 89.7 fL (08/08/2010 15:29) Added new observation of HCT: 41.8 % (08/08/2010 15:29) Added new  observation of HGB: 13.7 g/dL (08/08/2010 15:29) Added new observation of RBC M/UL: 4.66 M/uL (08/08/2010 15:29) Added  new observation of WBC COUNT: 12.8 10*3/microliter (08/08/2010 15:29)

## 2010-10-10 NOTE — Miscellaneous (Signed)
Summary: labs bmp ,ck  09/14/2010  Clinical Lists Changes  Observations: Added new observation of CK-MB ISOENZ: 81 ng/mL (09/14/2010 15:27) Added new observation of CALCIUM: 10.3 mg/dL (09/14/2010 15:27) Added new observation of CREATININE: 1.16 mg/dL (09/14/2010 15:27) Added new observation of BUN: 39 mg/dL (09/14/2010 15:27) Added new observation of BG RANDOM: 242 mg/dL (09/14/2010 15:27) Added new observation of CO2 PLSM/SER: 28 meq/L (09/14/2010 15:27) Added new observation of CL SERUM: 97 meq/L (09/14/2010 15:27) Added new observation of K SERUM: 3.9 meq/L (09/14/2010 15:27) Added new observation of NA: 138 meq/L (09/14/2010 15:27)

## 2010-10-13 LAB — POCT CARDIAC MARKERS
CKMB, poc: <1 ng/mL — ABNORMAL LOW (ref 1.0–8.0)
Myoglobin, poc: 57.2 ng/mL (ref 12–200)
Troponin i, poc: <0.05 ng/mL (ref 0.00–0.09)

## 2010-10-13 LAB — GLUCOSE, CAPILLARY
Glucose-Capillary: 111 mg/dL — ABNORMAL HIGH (ref 70–99)
Glucose-Capillary: 188 mg/dL — ABNORMAL HIGH (ref 70–99)
Glucose-Capillary: 204 mg/dL — ABNORMAL HIGH (ref 70–99)
Glucose-Capillary: 223 mg/dL — ABNORMAL HIGH (ref 70–99)
Glucose-Capillary: 270 mg/dL — ABNORMAL HIGH (ref 70–99)
Glucose-Capillary: 318 mg/dL — ABNORMAL HIGH (ref 70–99)
Glucose-Capillary: 318 mg/dL — ABNORMAL HIGH (ref 70–99)
Glucose-Capillary: 372 mg/dL — ABNORMAL HIGH (ref 70–99)
Glucose-Capillary: 80 mg/dL (ref 70–99)
Glucose-Capillary: 89 mg/dL (ref 70–99)

## 2010-10-13 LAB — CBC
HCT: 25.5 % — ABNORMAL LOW (ref 36.0–46.0)
HCT: 26.6 % — ABNORMAL LOW (ref 36.0–46.0)
HCT: 26.9 % — ABNORMAL LOW (ref 36.0–46.0)
Hemoglobin: 8.6 g/dL — ABNORMAL LOW (ref 12.0–15.0)
MCHC: 33.7 g/dL (ref 30.0–36.0)
MCV: 85.5 fL (ref 78.0–100.0)
MCV: 85.8 fL (ref 78.0–100.0)
MCV: 86.8 fL (ref 78.0–100.0)
Platelets: 236 10*3/uL (ref 150–400)
Platelets: 267 10*3/uL (ref 150–400)
RBC: 2.94 MIL/uL — ABNORMAL LOW (ref 3.87–5.11)
RBC: 3.1 MIL/uL — ABNORMAL LOW (ref 3.87–5.11)
RBC: 3.66 MIL/uL — ABNORMAL LOW (ref 3.87–5.11)
RDW: 16.6 % — ABNORMAL HIGH (ref 11.5–15.5)
WBC: 11.7 10*3/uL — ABNORMAL HIGH (ref 4.0–10.5)
WBC: 11.9 10*3/uL — ABNORMAL HIGH (ref 4.0–10.5)
WBC: 7.5 10*3/uL (ref 4.0–10.5)

## 2010-10-13 LAB — PROTIME-INR
INR: 1.16 (ref 0.00–1.49)
Prothrombin Time: 14.7 seconds (ref 11.6–15.2)

## 2010-10-13 LAB — DIFFERENTIAL
Lymphocytes Relative: 9 % — ABNORMAL LOW (ref 12–46)
Lymphs Abs: 1.1 10*3/uL (ref 0.7–4.0)
Monocytes Relative: 5 % (ref 3–12)
Neutrophils Relative %: 84 % — ABNORMAL HIGH (ref 43–77)

## 2010-10-13 LAB — BASIC METABOLIC PANEL
BUN: 27 mg/dL — ABNORMAL HIGH (ref 6–23)
Chloride: 106 mEq/L (ref 96–112)
Creatinine, Ser: 0.8 mg/dL (ref 0.4–1.2)
GFR calc Af Amer: >60 mL/min (ref >60)
GFR calc non Af Amer: >60 mL/min (ref >60)
Glucose, Bld: 79 mg/dL (ref 70–99)
Potassium: 3.6 mEq/L (ref 3.5–5.1)
Potassium: 4.8 mEq/L (ref 3.5–5.1)

## 2010-10-13 LAB — BRAIN NATRIURETIC PEPTIDE: Pro B Natriuretic peptide (BNP): 419 pg/mL — ABNORMAL HIGH (ref 0.0–100.0)

## 2010-10-13 LAB — COMPREHENSIVE METABOLIC PANEL
ALT: 17 U/L (ref 0–35)
Alkaline Phosphatase: 87 U/L (ref 39–117)
BUN: 13 mg/dL (ref 6–23)
CO2: 26 mEq/L (ref 19–32)
Calcium: 8.6 mg/dL (ref 8.4–10.5)
GFR calc non Af Amer: 56 mL/min — ABNORMAL LOW (ref >60)
Glucose, Bld: 112 mg/dL — ABNORMAL HIGH (ref 70–99)
Sodium: 136 mEq/L (ref 135–145)

## 2010-10-13 LAB — POCT I-STAT, CHEM 8
BUN: 14 mg/dL (ref 6–23)
Calcium, Ion: 1.18 mmol/L (ref 1.12–1.32)
Chloride: 104 mEq/L (ref 96–112)
HCT: 36 % (ref 36.0–46.0)
Potassium: 4 mEq/L (ref 3.5–5.1)
Sodium: 137 mEq/L (ref 135–145)

## 2010-10-13 LAB — IRON AND TIBC
Iron: 34 ug/dL — ABNORMAL LOW (ref 42–135)
Saturation Ratios: 11 % — ABNORMAL LOW (ref 20–55)
TIBC: 299 ug/dL (ref 250–470)

## 2010-10-13 LAB — T4, FREE: Free T4: 0.94 ng/dL (ref 0.80–1.80)

## 2010-10-13 LAB — MAGNESIUM: Magnesium: 1.9 mg/dL (ref 1.5–2.5)

## 2010-10-16 LAB — GLUCOSE, CAPILLARY
Glucose-Capillary: 138 mg/dL — ABNORMAL HIGH (ref 70–99)
Glucose-Capillary: 155 mg/dL — ABNORMAL HIGH (ref 70–99)
Glucose-Capillary: 158 mg/dL — ABNORMAL HIGH (ref 70–99)
Glucose-Capillary: 162 mg/dL — ABNORMAL HIGH (ref 70–99)
Glucose-Capillary: 169 mg/dL — ABNORMAL HIGH (ref 70–99)
Glucose-Capillary: 209 mg/dL — ABNORMAL HIGH (ref 70–99)

## 2010-10-16 LAB — BASIC METABOLIC PANEL
CO2: 24 mEq/L (ref 19–32)
Chloride: 109 mEq/L (ref 96–112)
GFR calc Af Amer: >60 mL/min (ref >60)
Glucose, Bld: 125 mg/dL — ABNORMAL HIGH (ref 70–99)
Potassium: 3.6 mEq/L (ref 3.5–5.1)
Sodium: 141 mEq/L (ref 135–145)

## 2010-10-16 LAB — COMPREHENSIVE METABOLIC PANEL
ALT: 19 U/L (ref 0–35)
AST: 25 U/L (ref 0–37)
Alkaline Phosphatase: 77 U/L (ref 39–117)
CO2: 24 mEq/L (ref 19–32)
Calcium: 9.6 mg/dL (ref 8.4–10.5)
Chloride: 110 mEq/L (ref 96–112)
GFR calc Af Amer: >60 mL/min (ref >60)
GFR calc non Af Amer: >60 mL/min (ref >60)
Potassium: 3.8 mEq/L (ref 3.5–5.1)
Sodium: 142 mEq/L (ref 135–145)
Total Bilirubin: 0.6 mg/dL (ref 0.3–1.2)

## 2010-10-16 LAB — CBC
HCT: 29.9 % — ABNORMAL LOW (ref 36.0–46.0)
HCT: 32.3 % — ABNORMAL LOW (ref 36.0–46.0)
MCHC: 33.2 g/dL (ref 30.0–36.0)
MCHC: 34.1 g/dL (ref 30.0–36.0)
MCV: 84.8 fL (ref 78.0–100.0)
MCV: 85.7 fL (ref 78.0–100.0)
RBC: 3.53 MIL/uL — ABNORMAL LOW (ref 3.87–5.11)
RBC: 3.77 MIL/uL — ABNORMAL LOW (ref 3.87–5.11)
WBC: 9.1 10*3/uL (ref 4.0–10.5)

## 2010-10-16 LAB — DIFFERENTIAL
Basophils Relative: 1 % (ref 0–1)
Eosinophils Absolute: 0.1 10*3/uL (ref 0.0–0.7)
Eosinophils Relative: 2 % (ref 0–5)
Lymphs Abs: 1.5 10*3/uL (ref 0.7–4.0)
Monocytes Relative: 6 % (ref 3–12)

## 2010-10-16 LAB — GIARDIA/CRYPTOSPORIDIUM SCREEN(EIA)
Cryptosporidium Screen (EIA): NEGATIVE
Giardia Screen - EIA: NEGATIVE

## 2010-10-16 LAB — FECAL LACTOFERRIN, QUANT: Fecal Lactoferrin: POSITIVE

## 2010-10-19 NOTE — Assessment & Plan Note (Signed)
Summary: 10 WK F/U PER CHECKOUT ON 07/28/10/TG/TR  Medications Added CRESTOR 10 MG TABS (ROSUVASTATIN CALCIUM) Take one tablet by mouth daily.(on hold X 1 month from 10-05-10) APIDRA 100 UNIT/ML SOLN (INSULIN GLULISINE) 180 to 220  units daily      Allergies Added:   Visit Type:  Follow-up Referring Provider:  GI-Dr. Christophe Louis; Endocrine-Dr. Altheimer; Urology-Dr. Alinda Money Primary Provider:  [name], MD   History of Present Illness: [name] [name] returns to the office for continued assessment and treatment of aortic valve disease with prior AVR, coronary artery disease with multiple percutaneous interventions followed by single vessel CABG and then a redo procedure in conjunction with her AVR and intermittent CHF with preserved left ventricular systolic function.  [name] [name] has done well from a cardiac standpoint, but required admission to hospital in January with severe nausea and emesis causing dehydration.  No specific etiology was identified although she has known cholelithiasis.  She has felt weak and experienced increased malaise since that admission but has otherwise done well.  She developed transient pulmonary edema in the hospital related to intravenous fluids.  Current Medications (verified): 1)  Tricor 145 Mg Tabs (Fenofibrate) .Marland Kitchen.. 1 Tablet By Mouth Once Daily 2)  Januvia 100 Mg Tabs (Sitagliptin Phosphate) .Marland Kitchen.. 1 Tablet By Mouth Once Daily 3)  Crestor 10 Mg Tabs (Rosuvastatin Calcium) .... Take One Tablet By Mouth Daily.(On Hold X 1 Month From 10-05-10) 4)  Zetia 10 Mg Tabs (Ezetimibe) .Marland Kitchen.. 1 Tablet By Mouth At Bedtime 5)  Apidra 100 Unit/ml Soln (Insulin Glulisine) .Marland KitchenMarland KitchenMarland Kitchen 180 To 220  Units Daily 6)  Calcium 600/vitamin D 600-400 Mg-Unit Tabs (Calcium Carbonate-Vitamin D) .Marland Kitchen.. 1 Tablet By Mouth Two Times A Day 7)  Multivitamins   Tabs (Multiple Vitamin) .... One Tablet By Mouth Daily 8)  Benefiber  Tabs (Wheat Dextrin) .... 2 By Mouth Once Daily 9)  Furosemide 80 Mg Tabs  (Furosemide) .... Take 1 Tablet By Mouth By Mouth Once Daily 10)  Tums 500 Mg Chew (Calcium Carbonate Antacid) .... As Needed 11)  Alprazolam 0.5 Mg Tabs (Alprazolam) .... Take 1/2 Tab Daily 12)  Metoprolol Tartrate 25 Mg Tabs (Metoprolol Tartrate) .... Take 1 Tab Two Times A Day 13)  Vitamin C Cr 500 Mg Cr-Tabs (Ascorbic Acid) .... Take 1 Tab Daily 14)  Coq10 100 Mg Caps (Coenzyme Q10) .... Take 2 Tabs Daily 15)  Omega-3 Krill Oil 300 Mg Caps (Krill Oil) .... Take 1 Cap Daily 16)  Colace 100 Mg Caps (Docusate Sodium) .... Two Tablets By Mouth in The Morning 17)  Furosemide 40 Mg Tabs (Furosemide) .... Take One Tablet By Mouth Daily As Needed On Days When Wt Is Increased 3-4 Lbs 18)  Potassium Chloride Crys Cr 20 Meq Cr-Tabs (Potassium Chloride Crys Cr) .... Take 2 Tablets By Mouth Daily 19)  Aspir-Low 81 Mg Tbec (Aspirin) .... Take 1 Tab Daily 20)  Nitrolingual 0.4 Mg/spray Soln (Nitroglycerin) .... One Spray Under Tongue Every 5 Minutes As Needed For Chest Pain---May Repeat Times Three 21)  Zovirax 5 % Oint (Acyclovir) .... Use As Needed 22)  Vitamin D 1000 Unit Tabs (Cholecalciferol) .Marland Kitchen.. 1 By Mouth At Bedtime 23)  Alprazolam 0.5 Mg Tabs (Alprazolam) .... 1/2 By Mouth At Bedtime 24)  Omeprazole 20 Mg Cpdr (Omeprazole) .... Take One Capsule 30 Minutes Before Breakfast  Allergies (verified): 1)  ! Cleocin 2)  ! Penicillin 3)  ! Codeine 4)  ! Erythromycin 5)  ! Keflex 6)  ! Lipitor 7)  ! Zocor  8)  ! * Avandia 9)  ! * Metformin  Comments:  Nurse/Medical Assistant: patient and i reviewed meds the only change is she is on omeprazole 20 mg daily [place] [facility]  Past History:  PMH, FH, and Social History reviewed and updated.  Review of Systems       See history of present illness.  Vital Signs:  Patient profile:   60 year old female Weight:      126 pounds BMI:     22.40 Pulse rate:   83 / minute BP sitting:   137 / 61  (left arm)  Vitals Entered By: Doretha Sou,  CNA (October 05, 2010 2:32 PM)  Physical Exam  General:  Trim, well developed; no acute distress:   Neck-No JVD; transmitted murmur to carotids: Lungs-clear to auscultation and resonant to percussion Cardiovascular-normal PMI; normal S1; increased intensity of P2; grade 4-1/7 systolic ejection murmur at the cardiac base Abdomen-BS normal; soft and non-tender without masses or organomegaly:  Musculoskeletal-No deformities, no cyanosis or clubbing: Neurologic-Normal cranial nerves; symmetric strength and tone:  Skin-Warm, no significant lesions: Extremities-Nl distal pulses; no edema:     Impression & Recommendations:  Problem # 1:  ATHEROSCLEROTIC CARDIOVASCULAR DISEASE (ICD-429.2) No symptoms to suggest recurrent myocardial ischemia.  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  Her device is a small one ( 19-mm  Edwards pericardial valve), and there may be a component of patient-valve mismatch.  Problem # 3:  HYPERTENSION (ICD-401.1) Blood pressure control has been good, and current medical regime will be continued.  BP today: 137/61 Prior BP: 122/62 (08/08/2010)  Labs Reviewed: K+: 3.9 (09/14/2010) Creat: : 1.16 (09/14/2010)    Problem # 4:  HYPERLIPIDEMIA (ICD-272.4) Control has been excellent; however, patient is experiencing myalgias, which may be an adverse effect of statins.  I explained to her that her coronary disease is likely the result of radiation therapy as a child and may not respond to treatment in the same way that ASCVD does.  Rosuvastatin will be held to determine if symptoms improve.  If they do, she will be switched to pravastatin  CHOL: 124 (07/25/2010)   LDL: 65 (07/25/2010)   HDL: 37 (07/25/2010)   TG: 120 (07/25/2010)  Other Orders: Future Orders: T-Comprehensive Metabolic Panel ([id number]) ... 11/09/2010  Patient Instructions: 1)  Your physician recommends that you schedule a follow-up  appointment in: 10 weeks 2)  Your physician recommends that you return for lab work in: 5 weeks 3)  Your physician has recommended you make the following change in your medication: Stop taking Crestor for 1 month, if muscle symptoms are no better at that time resume taking Crestor, if symptoms are better remain off of Crestor.

## 2010-10-26 LAB — POCT I-STAT GLUCOSE: Operator id: 221371

## 2010-11-10 ENCOUNTER — Other Ambulatory Visit: Payer: Self-pay | Admitting: Cardiology

## 2010-11-10 LAB — COMPREHENSIVE METABOLIC PANEL
AST: 28 U/L (ref 0–37)
Albumin: 4.9 g/dL (ref 3.5–5.2)
Alkaline Phosphatase: 88 U/L (ref 39–117)
BUN: 38 mg/dL — ABNORMAL HIGH (ref 6–23)
Creat: 1.2 mg/dL (ref 0.40–1.20)
Glucose, Bld: 81 mg/dL (ref 70–99)

## 2010-11-10 IMAGING — CR DG CHEST 2V
2 series · 2 of 2 positions shown · non-contrast
Comparison: 07/31/2009

CLINICAL DATA: Follow up, soreness in chest and back.

CHEST - 2 VIEW

[w chest pa]
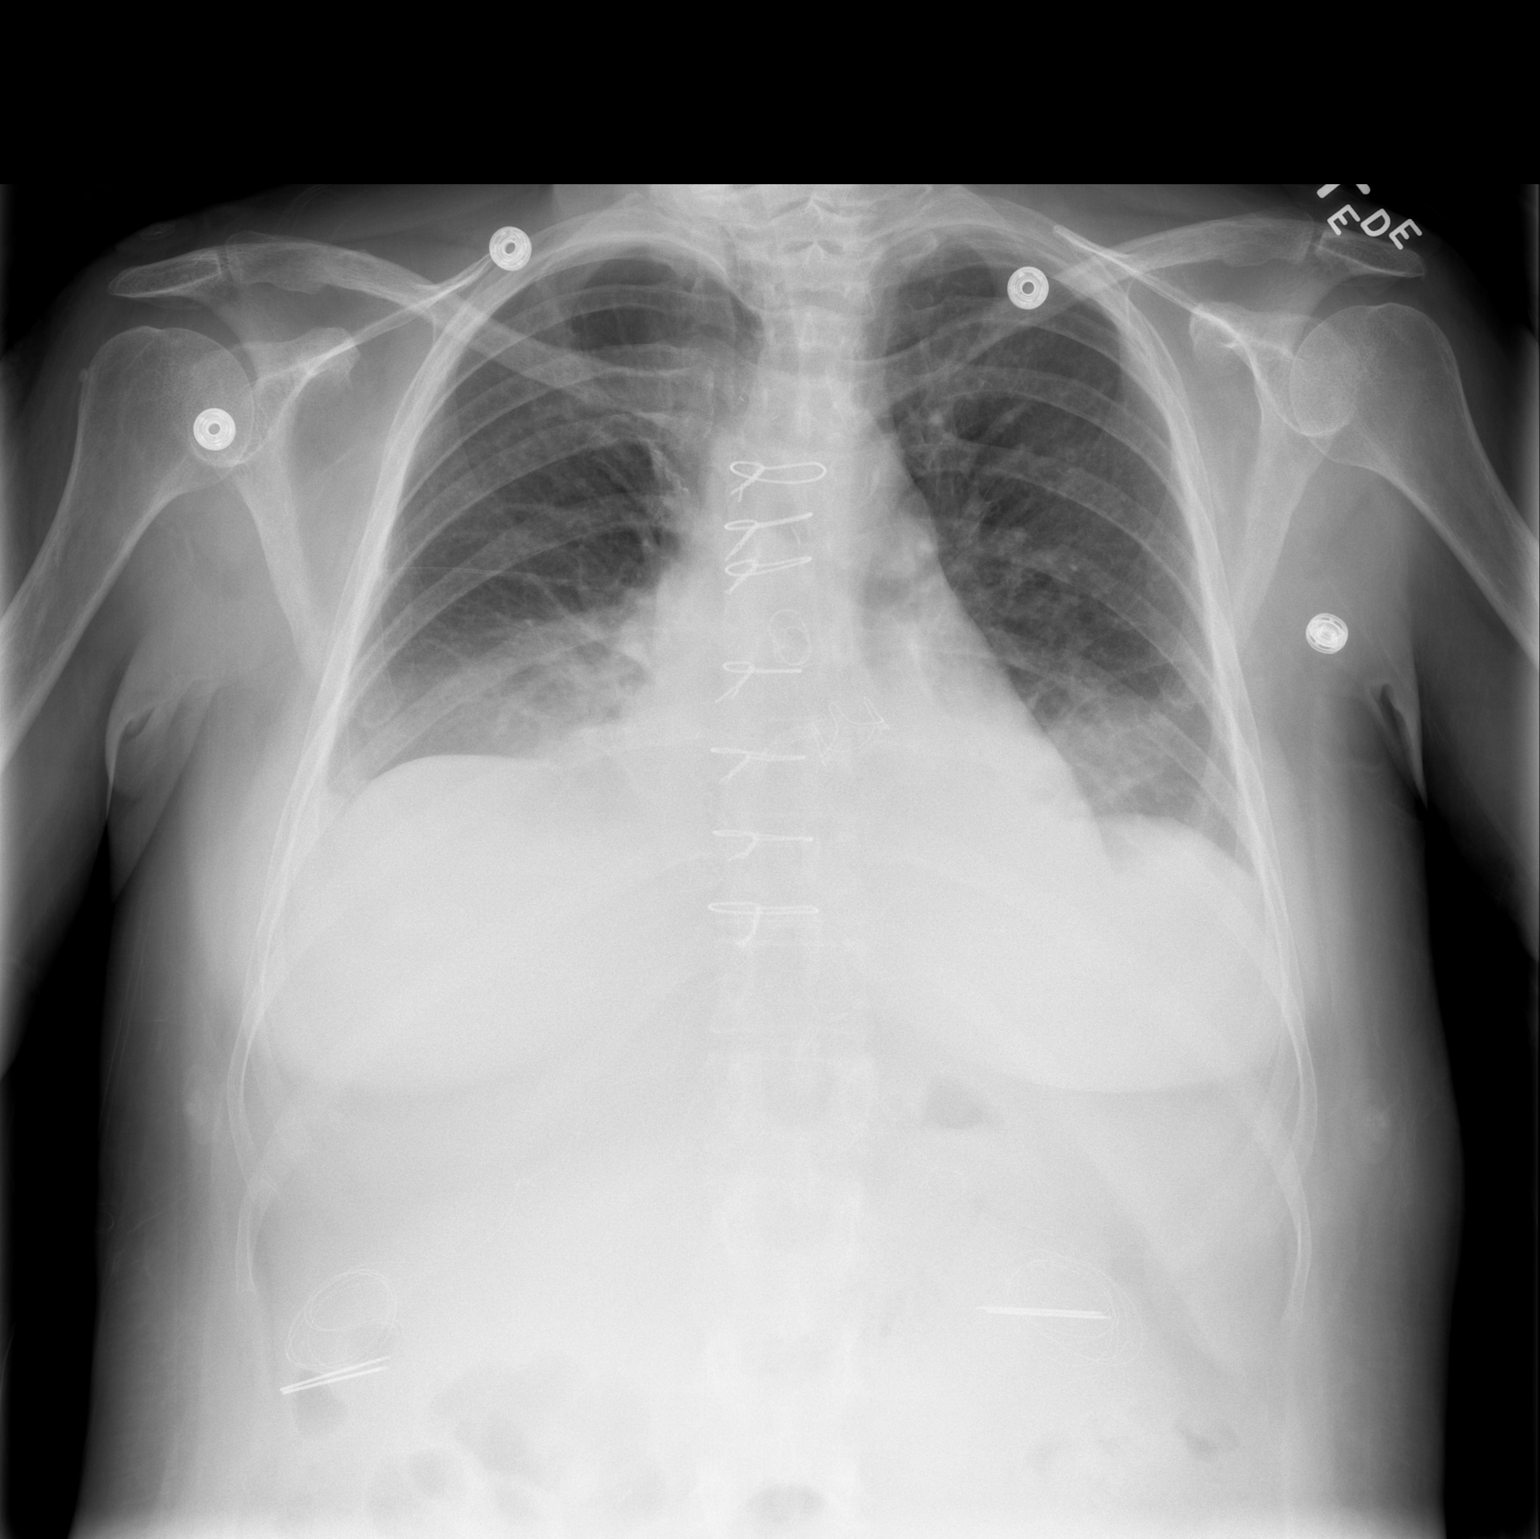

[w chest lat]
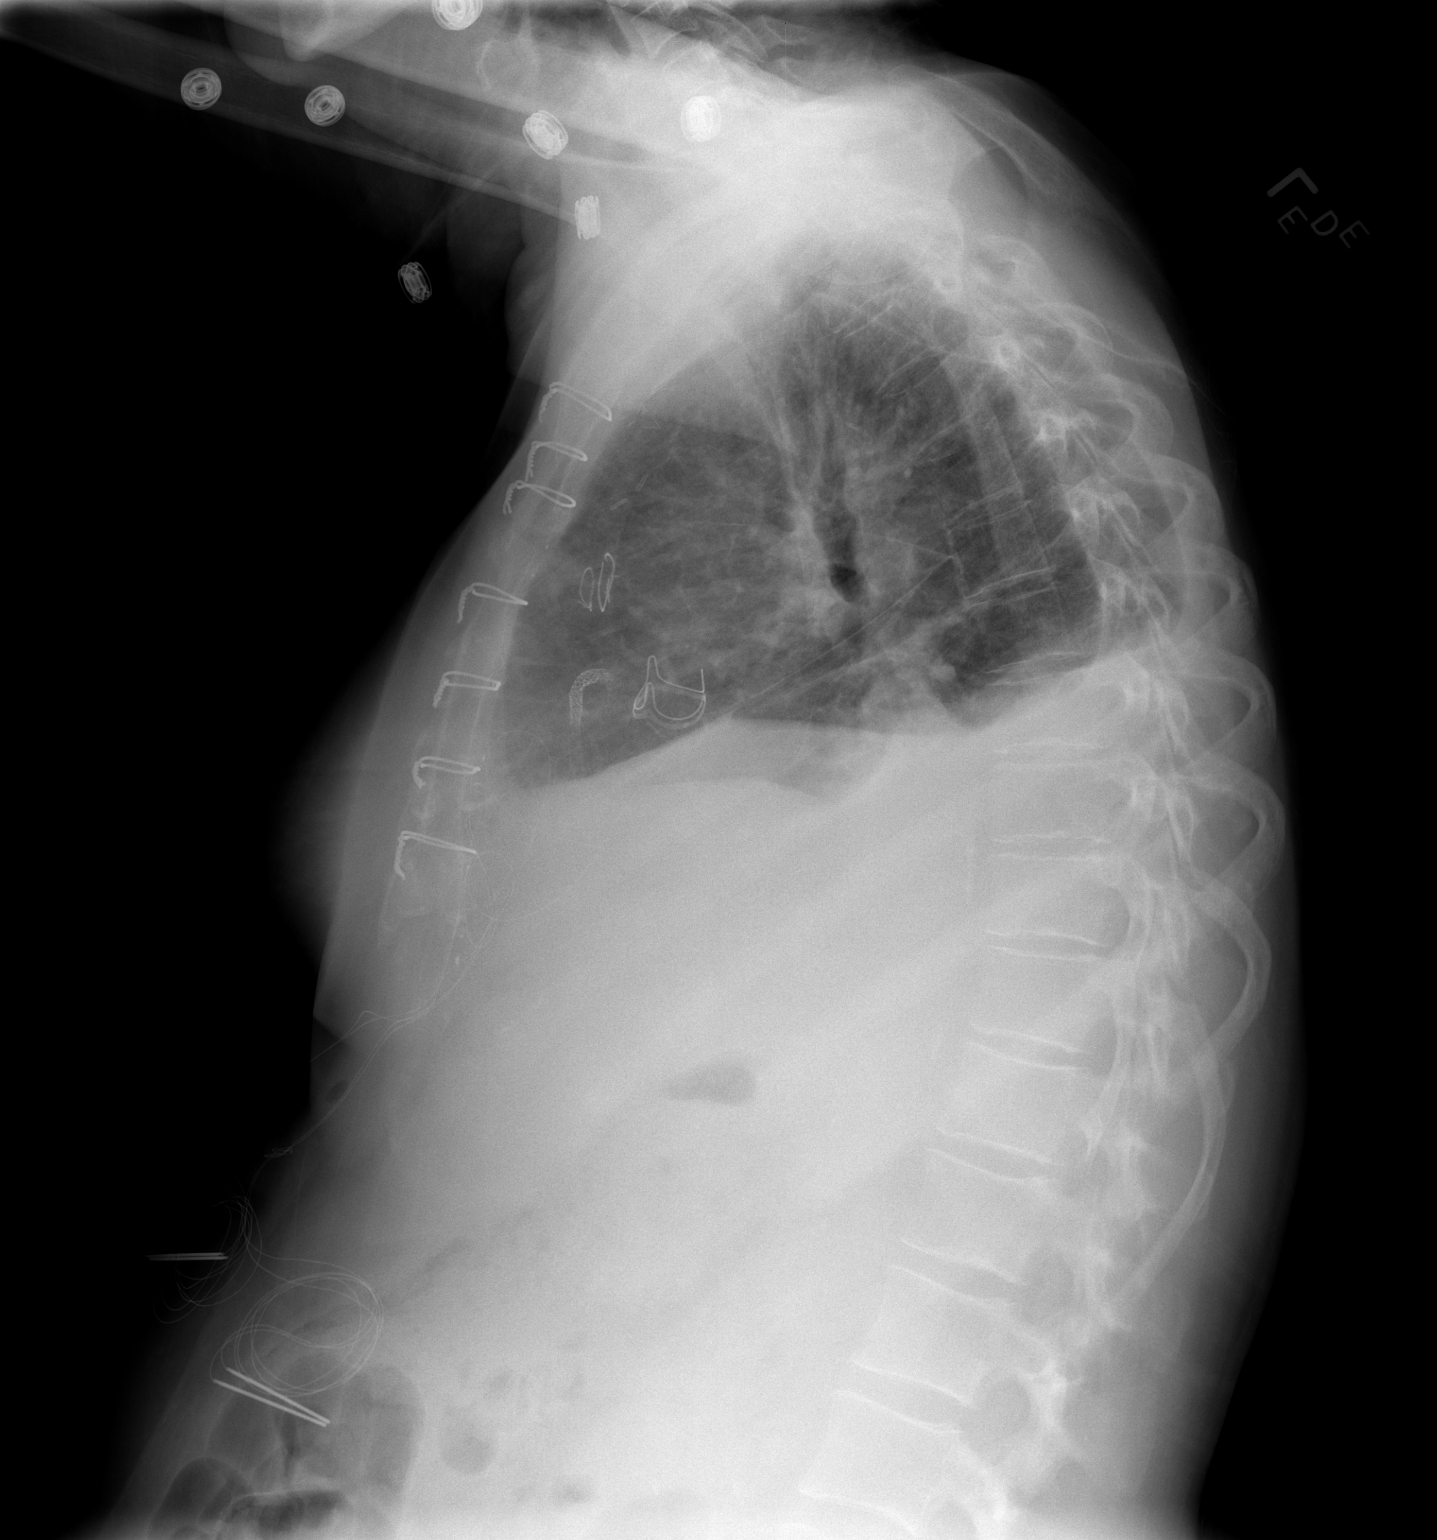

[2 of 2 positions shown; findings below may reference images not displayed]

FINDINGS: Heart size is normal.

There are bilateral pleural effusions identified with bibasilar
atelectasis.

The upper lobes are clear.
IMPRESSION: 1.  No change in pleural effusions and bibasilar atelectasis.

## 2010-11-17 ENCOUNTER — Encounter: Payer: Self-pay | Admitting: *Deleted

## 2010-12-05 NOTE — Assessment & Plan Note (Signed)
OFFICE VISIT   [name] [name], [name] [name]  DOB:  Aug 20, 1950                                        August 30, 2009  CHART #:  [id number]   HISTORY:  The patient returned to my office today for followup status  post redo sternotomy with redo coronary bypass x1 and aortic valve  replacement with a 19-mm Edwards pericardial valve on July 27, 2009.  Since discharge home, she has been feeling relatively well, but does  report some shortness of breath when she starts talking a lot as well as  when lying down at night.  She has had some coughing, stimulated about  taking deep breaths.  She denies any fever or chills.  She has been  eating fairly well.  She has noted some swelling in her ankles at times.   PHYSICAL EXAMINATION:  Blood pressure 145/73, pulse 88 and regular, and  respiratory rate is 18 unlabored.  Oxygen saturation on room air is 97%.  She looks well.  Cardiac exam shows regular rate and rhythm with  systolic flow murmur over aorta.  There is no diastolic murmur.  Lungs  reveal decreased breath sounds over the right lower lobe.  Chest  incision is healing well and the sternum is stable.  Her leg incision is  healing well.  There is no significant peripheral edema.   Followup chest x-ray today shows a moderate-sized right pleural  effusion, which is larger than it was at the time of discharge from the  hospital.   Her medication list was reviewed.  No changes were made.   IMPRESSION:  Overall, the patient is recovering fairly well from her  surgery, although she does have a moderate-sized right pleural effusion  with some symptoms of shortness of breath and orthopnea.  I think be  best to have Interventional Radiology perform ultrasound-guided  thoracentesis to remove the effusion.  We will schedule that as quickly  as possible.  I will also start her on Lasix 40 mg daily and potassium  chloride 20 mEq daily since she said she has had some lower  extremity  edema at times.  I encouraged her to continue walking as much as  possible and use her incentive spirometer.  I told her she can return to  driving a car, but should refrain  from lifting anything heavier than 10 pounds for a total of 3 months  from date of surgery.  I will plan to see her back in about 2 weeks with  a followup chest x-ray.   Gilford Raid, M.D.  Electronically Signed   BB/MEDQ  D:  08/30/2009  T:  08/31/2009  Job:  [id number]   cc:   Cristopher Estimable. Lattie Haw, MD, [facility]

## 2010-12-05 NOTE — Assessment & Plan Note (Signed)
[facility] OFFICE NOTE   NAME:Uy, SAGAN WURZEL                    MRN:          [id number]  DATE:11/02/2008                            DOB:          [birth year]    CARDIOLOGIST:  Dr. Cristopher Estimable. Lattie Haw, M.D., [facility].   PRIMARY CARE PHYSICIAN:  Leonides Grills, M.D.   REASON FOR VISIT:  Chest pain and left arm pain.   HISTORY OF PRESENT ILLNESS:  Ms. Prieur is a 60 year old female  patient with a complex past medical history that includes coronary  artery disease status post multiple PCIs to the RCA eventually requiring  bypass surgery with a vein graft to the RCA in December 2000, as well as  bicuspid aortic valve with a mild-to-moderate aortic stenosis, Hodgkin's  lymphoma status post mantle radiation in 1991 and a right nephrectomy in  2008 secondary to renal cell carcinoma.  She last underwent a cardiac  catheterization in July 2007.  At that time she had single-vessel CAD  with a chronic total occlusion of the RCA and a 40% to 50% ostial  stenosis of the vein graft to the PDA.  This is not felt to be flow  limiting.  By hemodynamic measurements, she had mild mitral  regurgitation and mild aortic stenosis.  Her last exercise treadmill  test was in January 2006 and this was electrically negative for  ischemia.  Her last Myoview study was in 2005 and this demonstrated  normal myocardial perfusion.  Her EF has been normal in the past.   She presents to the office today for further evaluation of chest pain,  as well as left scapular pain and left arm pain.  The patient notes a  long history of chest discomfort off and on since her bypass surgery in  2000.  This has been fairly stable over the years.  It has been mainly  with exertion.  Over the last 6 to 12 months, she has begun to note  chest tightness with exertion and shortness of breath that seems to be  different from her prior symptoms.  She  denies radiating symptoms or  associated nausea or diaphoresis or syncope.  She recently purchased an  elliptical machine and has been exercising on this for the last 4 to 6  weeks.  She has gradually increased her time interval from 5 to 15  minutes.  She usually notes chest discomfort at the end of her exercise.   The patient has begun to note left scapular and left arm pain over the  last week.  This usually is noticeable as she is lying in bed, drifting  off to sleep.  She has noted it during the day as well, but it is more  noticeable at night.  She denies exertional symptoms.  She does feel  slightly short of breath with this.  She denies nausea or diaphoresis  associated with  this.  She has taken nitroglycerin on several occasions  with prompt relief of her symptoms.  This was concerning for her and she  eventually decided to pursue follow-up with an appointment today.  She  notes an episode of discomfort in her arm about 1 to 2 hours ago.  This  is resolved currently.   CURRENT MEDICATIONS:  Crestor 20 mg daily, Zetia 10 mg daily, Caltrate  plus vitamin D, Multivitamin b.i.d., Aspirin 81 mg daily, Januvia 100 mg  daily, Tricor 145 mg daily, Apidra insulin pump,  Vitamin D 1000 units daily, Benefiber 2 teaspoons daily, Loratadine 10  mg daily, Diovan unknown dosage daily, Furosemide 40 mg p.r.n.,  Potassium 10 mEq p.r.n.,  Tums p.r.n., Nitroglycerin p.r.n.   ALLERGIES:  CLEOCIN, PENICILLIN, CODEINE, ERYTHROMYCIN, KEFLEX and  LIPITOR.   SOCIAL HISTORY:  She denies tobacco abuse.   REVIEW OF SYSTEMS:  Please see HPI.  She denies fevers, chills, cough,  melena, hematochezia, hematuria, dysuria.  All other systems reviewed  and negative.   PHYSICAL EXAM:  She is a well-nourished, well-developed female in no  acute distress.  Blood pressure is 140/67, pulse 111, weight 126.  HEENT:  Normal.  NECK:  Without JVD.  Lymph without lymphadenopathy.  CARDIAC:  Normal S1, S2 with  minimal splitting, 2 to 3/6 systolic  ejection murmur heard best at the right upper sternal border, as well as  the left sternal border with radiation up into the left carotid.  ABDOMEN:  Soft, nontender.  EXTREMITIES:  Without edema.  LUNGS:  Clear to auscultation bilaterally.  No wheezing or rhonchi or  rales.  SKIN:  Warm and dry.  NEUROLOGIC:  She is alert and oriented x3.  Cranial nerves II to XII are  grossly intact.  MUSCULOSKELETAL:  Without obvious deformity.   Electrocardiogram reveals sinus tachycardia with a long, first-degree AV  block with a PR interval of 248 milliseconds, T-wave inversions I, aVL  and V1 through V2.  Her electrocardiogram was compared to prior  electrocardiograms with Dr. Lattie Haw, and there does not appear to be a  significant change.   ASSESSMENT/PLAN:  1. Chest pain and left arm pain and shortness of breath in a 5-year-      old female with a history of single-vessel coronary disease status      post multiple percutaneous coronary interventions to the right      coronary artery and eventual bypass surgery in 2000.  Subsequent      follow-up cardiac catheterization in 2007 demonstrated continued      single-vessel disease with a totally occluded right coronary artery      and a patent vein graft to the posterior descending artery with 40%      to 50% ostial stenosis.  Her left ventricular  function has been      normal in the past.  The patient was also interviewed and examined      by Dr. Lattie Haw.  We plan to pursue further workup with a stress      echocardiogram to assess for ischemia.  No medication changes will      be made today.  She can continue on as-needed nitroglycerin.  We      will see her back in close follow-up after her stress test.  2. Mild to moderate aortic stenosis with a history of bicuspid aortic      valve.  The patient will also undergo baseline 2-D echocardiogram      to reassess her valve at the time  of her stress  test.  3. Hypertension.  This is at borderline control.  No medication change      will be made at this time.  We  will continue to monitor her blood      pressure.  4. Conduction system disease.  She has significantly prolonged PR      interval with first-degree atrioventricular block and sinus      tachycardia on electrocardiogram today.  She is not on a beta-      blocker.  We will not begin a beta-blocker given her first-degree      atrioventricular block.  5. Dyslipidemia.  She continues on Crestor, Zetia and Tricor.  Her      last set of lipids from Dr. Elyse Hsu in November 2009 demonstrated      a triglyceride level of 101, HDL 48, LDL 62 and normal LFTs at that      time.   DISPOSITION:  The patient will be brought back in follow-up in the next  couple of weeks after a stress test to review the findings and make  further recommendations, if any.      Richardson Dopp, PA-C  Electronically Signed      Cristopher Estimable. Lattie Haw, MD, [facility]: [facility]  Electronically Signed   SW/MedQ  DD: 11/02/2008  DT: 11/02/2008  Job #: [id number]   cc:   Leonides Grills, M.D.

## 2010-12-05 NOTE — Assessment & Plan Note (Signed)
OFFICE VISIT   [name] [name], [name] [name]  DOB:  [birth year]                                        September 14, 2009  CHART #:  [id number]   HISTORY:  The patient returned to my office today for followup status  post redo coronary bypass x1 and aortic valve replacement using a 19-mm  Edwards pericardial valve on July 27, 2009.  She had been seen back in  the office by me on August 30, 2009 and was noted to have a moderate-  sized right pleural effusion with some symptoms of shortness of breath  and orthopnea.  I have scheduled her to have it drained by  Interventional Radiology and this was performed later in the week.  I  think they were able to remove about 700 or 800 mL of serous fluid.  She  said she had developed a fair amount of coughing and some pleuritic  right chest pain after the procedure, which worsened over the course of  evening and she came back to the emergency room, was admitted by the  hospitalist service.  Her chest x-ray was felt to show right lower lobe  atelectasis or pneumonia.  She had a CT scan of the chest to rule out  pulmonary embolism and this was negative.  This did show residual right  lower lobe atelectasis and a small amount of residual right pleural  fluid.  The hospitalist thought that she probably had pneumonia and  started her on broad-spectrum triple antibiotics.  I saw her the next  day and felt that she most likely developed pleuritic chest pain due to  the thoracentesis with residual pleural fluid and right lower lobe  atelectasis.  I did not feel that she had pneumonia.  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  I recommended starting her on a  brief course of prednisone.  This was done and she rapidly improved.  She was discharged home and since discharge she said that she has  continued to improve.  Over the past few days, she has felt much better.  She still has a little bit of cough with deep breathing,  but no  significant chest pain.  She has had no fever or chills.   PHYSICAL EXAMINATION:  Blood pressure 132/83, pulse 98 and regular, and  respiratory rate is 18 unlabored.  Oxygen saturation on room air is 97%.  She looks well.  Cardiac exam shows regular rate and rhythm with normal  prosthetic valve sounds.  Her lungs are clear.  The chest incision is  healing well and the sternum is stable.  There is no peripheral edema.   A followup chest x-ray shows near complete resolution of the right  pleural effusion and right lower lobe atelectasis.  Lungs are otherwise  clear.   Her medication list was reviewed and is extensive and is in the chart.   Her only other complaint is that she has developed some vaginal itching  since being on the antibiotics in the hospital and feels that she may be  getting a yeast infection.  I did write her a prescription today for  Diflucan 150 mg p.o. x1 with one refill, since she said that this  usually will resolve her yeast infection when she gets it.  I told her  she could return  to driving a car, but should refrain from lifting  anything heavier than 10 pounds for a total of 3 months from date of  surgery.  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  She will continue to follow up with  Cardiology and will contact me if she develops any problems with her  incisions.   Gilford Raid, M.D.  Electronically Signed   BB/MEDQ  D:  09/14/2009  T:  09/15/2009  Job:  [id number]

## 2010-12-05 NOTE — H&P (Signed)
NAME:  [name] [name], [name] [name]             ACCOUNT NO.:  [id number]   MEDICAL RECORD NO.:  [id number]          PATIENT TYPE:  INP   LOCATION:  1534                         FACILITY:  [facility]   PHYSICIAN:  Gatha Mayer, MD,FACGDATE OF BIRTH:  1951-07-08   DATE OF ADMISSION:  05/20/2007  DATE OF DISCHARGE:                              HISTORY & PHYSICAL   PROBLEM:  GI bleeding.   HISTORY:  [name] [name] is a pleasant 60 year old white female known to Dr.  Henrene Pastor, a primary patient of Dr. Caron Presume in [place] and also known to  Dr. Rosana Hoes and Alinda Money of urology here.  She has a complicated past  medical history, was diagnosed with Hodgkin disease in 32.  Had  extensive radiation from her neck to her umbilicus.  She also has a  history of coronary artery disease, has had prior CABG in 2000, has  insulin-dependent diabetes mellitus with an insulin pump.  She was  recently diagnosed with a renal mass and felt to be a renal cell  carcinoma and is tentatively scheduled for surgery with Dr. Rosana Hoes on  November 14.   The patient had onset last evening in the middle of the night with  severe abdominal cramping associated with diaphoresis and nausea  followed by very dark, foul-smelling stools.  She apparently had a  syncopal episode at home while sitting on the commode bent over to vomit  in the trash can but did not have any injury as she hit a basket of  laundry she had taken into the bathroom with her.  She says she knows  that she passed out but was not sure how long she had lost consciousness  for.  She with help of her husband was able to get back to bed and rest  for a little while and then at about 7 a.m. had another episode of  severe cramping followed by more obviously bloody stools.  She has had  eight episodes of bloody stools at the time of admission.  She had  actually gone to see Dr. Rosana Hoes earlier today, says that she had an  appointment and she was not sure where else to go and was then  advised  to come to the emergency room for further evaluation.  In the ER white  count is 16,000, hemoglobin is 14.  She is admitted at this time for  observation and supportive management for a probable segmental ischemic  colitis.   PAST HISTORY:  1. Aortic stenosis, mild to moderate, with no progression on recent      echo.  2. Coronary artery disease, status post CABG in 2000, last      catheterization in 2007.  3. History of nodular Hodgkin's.  She had radiation in 1981 and      removal of a tumor from her neck.  4. 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  5. Status post TAH.  6. History of CHF with constrictive  pericarditis.  7. Migraines.  8. Remote history of peptic ulcer disease.  9. Insulin-dependent diabetes mellitus type 2.   LABORATORY STUDIES ON ADMISSION:  WBC of 16.9, hemoglobin 14.2,  hematocrit of 41.9, 85% segs.  Potassium 4.1, BUN 11, creatinine 0.6.  Liver function studies normal, SGPT of 37.  CKs were negative x1.  UA is  negative.  X-rays:  No x-rays done in the emergency room.  The patient  has had a recent CT scan and ultrasound done at [facility].  We  do not have those results.   CURRENT MEDICATIONS:  1. Neurontin 300 mg I believe t.i.d.  2. Aspirin 325 mg daily.  3. Tricor 145 mg q.a.m.  4. Lasix 40 mg q.a.m.  5. Potassium chloride 10 mEq q.a.m.  6. Januvia 100 mg q.a.m.  7. Amaryl 4 mg one tablet in a.m. and 1/2 tablet p.m.  8. Norvasc 2.5 mg b.i.d.  9. Crestor 20 mg p.o. q.p.m.  10.Zetia 10 mg q.p.m.  11.Estradiol patch weekly 0.075.  12.She has an Apidra insulin pump.   ALLERGIES/INTOLERANCES:  CLEOCIN with esophageal spasm.  PENICILLIN,  rash and swelling.  CODEINE, rash and itching.  ERYTHROMYCIN, abdominal  pain and rash.  KEFLEX, rash and itching.  LIPITOR and ZOCOR, elevated  LFTs, and AVANDIA  and METFORMIN, felt possibly to have contributed to  her first episode of CHF.   FAMILY HISTORY:  Positive for coronary artery disease in her mother and  father, who both died suddenly secondary to MIs.  One half-sister with  breast cancer and a half-sister with lung CA.   SOCIAL HISTORY:  The patient is married.  She is employed as an  Nature conservation officer.  She has two grown children, ages 59 and  25.  She is a nonsmoker, nondrinker.   REVIEW OF SYSTEMS:  Pertinent for generalized weakness.  She says she  recently started on Neurontin because of what was felt to be a  neurogenic type of pain in her chest, which was has been lancinating as  sharp.  The Neurontin has helped.  She has no current anginal symptoms.  Denies any cough, shortness of breath or sputum production.  GENITOURINARY:  Currently negative but scheduled for surgery for  possible renal cell CA as outlined above.  GI: As above.  ENDOCRINE:  Positive for diabetes, managed with an insulin pump.  All other review  of systems negative.   PHYSICAL EXAM:  A well-developed white female in no acute distress.  She  is alert and oriented x3.  She is a good historian.  Temperature is 98.5, blood pressure 160/64, pulse in the 90s, saturation  94% on room air.  HEENT:  Nontraumatic, normocephalic.  EOMI, PERRLA.  Sclerae anicteric.  NECK:  Supple and without nodes.  She does have a scar anteriorly in her  lower neck.  CARDIOVASCULAR:  Regular rate and rhythm with S1 and S2.  She does have  a 3/6 systolic murmur.  Sternotomy scar.  LUNGS:  Clear.  ABDOMEN:  Soft.  She is tender in the left mid quadrant, left lower  quadrant and mildly in the suprapubic area.  There is no guarding and no  rebound.  No mass or hepatosplenomegaly felt.  Bowel sounds are active.  RECTAL:  Exam done per the ER MD heme-positive and not repeated.  NEUROLOGIC:  Grossly nonfocal.   IMPRESSION:  28. A 60 year old white female with acute abdominal  cramping and      hematochezia with normal  hemoglobin and leukocytosis, episode      associated with syncope which was probably vasovagal with diarrhea      and vomiting.  Suspect she had an episode of segmental ischemic      colitis trigger, question volume depletion, relative hypotension or      spasm.  2. Newly-diagnosed probable renal cell carcinoma.  3. Insulin-dependent diabetes mellitus with insulin pump.  4. Coronary artery disease, status post cabbage 2000.  5. History of Hodgkin disease, status post radiation in 1981.  6. Hypertension.  7. History of congestive heart failure with a constrictive      pericarditis.  8. Hyperlipidemia.   PLAN:  1. The patient is admitted to the service of Dr. Silvano Rusk for IV      fluid hydration, serial H&H's, pain control, antiemetics, bowel      rest, and we will hold her Lasix and hypertensives in the short      term and schedule her for sigmoidoscopy in the a.m.  2. Will ask internal medicine to follow her from a diabetes standpoint      and help with insulin management.      Amy Esterwood, PA-C      Gatha Mayer, MD,FACG  Electronically Signed    AE/MEDQ  D:  05/21/2007  T:  05/21/2007  Job:  [id number]   cc:   Jori Moll L. Rosana Hoes, M.D.  Fax: [phone]   Bonne Dolores, M.D.  Fax: [phone]

## 2010-12-05 NOTE — Letter (Signed)
February 10, 2007    Leonides Grills, M.D.  [address] [place], [place] [address]   RE:  CYTHIA, BACHTEL  MRN:  [id number]  /  DOB:  [birth year]   Dear Rush Landmark:   Ms. Meadowcroft return to the office for continued assessment and treatment  of bicuspid aortic valve with aortic stenosis and a complex history of  coronary disease.  Since I last saw her, she has done fairly well from a  cardiac standpoint.  She does note some exercise intolerance.  She  attended a week-long conference in [place], [place] and had some difficult  times, most notably when she had to walk up a nonfunctioning escalator  from the [place].  She noted dyspnea and had to stop a number of times  before she finally emerged at street level.  Nonetheless, she has been  walking at least a mile a day at home without difficulty.  One of her  sons took possession of the family treadmill.  She has a stationary  bicycle but does not use it.   Diabetic control has been good.  Other laboratories have been generally  fine except for mild transaminase elevations a few months ago.  Repeat  LFTs were normal this month.   Ms. Torbert primary symptom has been recurrent nausea.  This  typically happens in the morning and passes spontaneously after a  relatively short period of time.  There is no relationship to meals,  body position or any other factor that she can identify.  She did have  some hematochezia in the recent past.  There has been no melena.   Current medications are unchanged from her last visit except for the  addition of Genuvia.   PHYSICAL EXAMINATION:  On exam, a trim, serious woman in no acute  distress.  The weight is 128, 8 pounds more than in March.  Blood pressure 125/60,  heart rate 82 and regular.  Respirations 16.  NECK:  No jugular venous distention; normal carotid upstrokes with  transmitted murmurs bilaterally.  LUNGS:  Clear.  CARDIAC:  A grade 3/6 systolic ejection murmur at the cardiac base with  some decreased intensity in the second heart sound.  ABDOMEN:  Soft and nontender; no masses, no bruits, no organomegaly.  EXTREMITIES:  No edema; normal distal pulses.  LUNGS:  Clear.   Laboratory from multiple physicians is available.  Renal function has  been normal.  Hemoglobin A1C was 7.6 in May.  SGOT and SGPT were 47 and  64, respectively, in June.  Lipids have been excellent with a total  cholesterol of 128, triglycerides of 130, HDL 43, and LDL of 67 earlier  this month.  CBC was normal earlier this month.   IMPRESSION:  Ms. Nobis is doing well from a cardiac standpoint.  Her  exercise intolerance is partially related to physical deconditioning.  She is not inclined to push her exercise regime much beyond its current  level, which is fine with me.  Her metabolic parameters are great,  except perhaps hemoglobin A1C.  I will leave that to Dr. Altheimer's  discretion.   As we discussed, she has a number of GI issues, including the nausea,  the transient abnormality and LFTs, and hematochezia.  We will arrange  for her to see Dr. Laural Golden to evaluate her situation.  I will see this  nice woman again in six months.    Sincerely,      Cristopher Estimable. Lattie Haw, MD, [facility]  Electronically Signed  RMR/MedQ  DD: 02/10/2007  DT: 02/11/2007  Job #: [id number]

## 2010-12-05 NOTE — Op Note (Signed)
NAME:  [name] [name], [name] [name]             ACCOUNT NO.:  [id number]   MEDICAL RECORD NO.:  [id number]          PATIENT TYPE:  INP   LOCATION:  1226                         FACILITY:  [facility]   PHYSICIAN:  Raynelle Bring, MD      DATE OF BIRTH:  1950/11/17   DATE OF PROCEDURE:  06/11/2007  DATE OF DISCHARGE:                               OPERATIVE REPORT   PREOPERATIVE DIAGNOSIS:  Right renal mass.   POSTOPERATIVE DIAGNOSIS:  Right renal mass.   PROCEDURE:  Right laparoscopic radical nephrectomy.   SURGEON:  Raynelle Bring, M.D.   ASSISTANT:  Duane Lope. Rosana Hoes, M.D.   ANESTHESIA:  General.   COMPLICATIONS:  None.   ESTIMATED BLOOD LOSS:  200 mL.   INTRAVENOUS FLUIDS:  1600 mL crystalloid.   SPECIMENS:  Right kidney and proximal ureter.   DISPOSITION OF SPECIMEN:  To pathology.   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   INDICATION:  [name] [name] is a 60 year old female who was found to have  an incidentally detected enhancing right renal mass worrisome for  malignancy.  She underwent a metastatic evaluation which was negative  and after discussing management options, elected to proceed with  surgical removal.  Based on her prior abdominal surgeries, she  understood that while an attempt at laparoscopic nephrectomy could be  made, that there was a high chance for possible open conversion.  In  addition, due to the central location of her tumor, she also understood  that this may represent a urothelial tumor requiring a  nephroureterectomy.  The potential risks, complications and alternative  options for management were discussed with the patient in detail and  informed consent was obtained.   DESCRIPTION OF PROCEDURE:  The patient was taken to the operating room  and a general anesthetic was administered.  She was given preoperative  antibiotics, placed in the right modified flank position, prepped and  draped in the usual sterile fashion.  Next, a preoperative time out was  performed.  A site was selected superior and to the right of the  umbilicus away from her prior midline incision for placement of the  camera port.  This was placed using a standard open Hassan technique.  Entry was made into the peritoneal cavity under direct vision and no  underlying adhesions were noted.  With digital palpation, there were  noted to be adhesions inferiorly and medially but well away from this  site.  0 Vicryl holding sutures were placed into the abdominal fascia  and a Hassan cannula was secured with the fascial sutures.  A  pneumoperitoneum was established and the 30 degrees lens was used to  inspect the abdomen.  There were noted to be no significant adhesions  overlying the kidney in the right upper quadrant.  There were adhesions  in the right lower quadrant and toward the midline.  A 5 mm port was  able to be placed superior and lateral to the camera port site.   A few of the adhesions between the bowel and abdominal wall were taken  down with  the harmonic scalpel and a 12 mm port was placed in the right  lower quadrant.  A 5 mm port was placed in the far right lateral  abdominal wall with laparoscopic assistance.  All ports were placed  under direct vision without difficulty.  The white line of Toldt was  then incised along the length of the ascending colon allowing the colon  to be mobilized medially and the space between Gerota's fascia and the  colonic mesentery to be developed.  The ureter and gonadal vein were  identified and were lifted anteriorly off the psoas muscle.  Notably,  the patient did have significant fibrous tissue attachments requiring  careful dissection.  The ureter and gonadal vein were then lifted  anteriorly and dissection proceeded superiorly toward the renal hilum.  The inferior vena cava was identified and the duodenum was mobilized  medially off the inferior vena  cava.  The gonadal vein was identified  entering the inferior vena cava and was isolated and divided between  multiple 5 mm Hem-A-Lock clips.  The main renal vein was then identified  and a pulsation was noted on the superior and posterior aspect of the  renal vein.  Based on intraoperative evaluation, there appeared to be a  branching single renal artery.  The anterior branch of this artery was  able to be isolated and divided between Hem-A-Lock clips.  The posterior  branch was then isolated.  Due to the fact that the artery was difficult  to dissect away from the renal vein due to her fibrous tissue, it was  decided to ligate the posterior branch of the renal artery along with  the renal vein en bloc with the 45 mm flex ETS stapler.  The stapler  was, therefore, placed across the renal hilum and closed without  difficulty.  Upon firing the stapler, it was noted that the staple would  not fire all the way through.  At this point, the stapler was kept  closed and the right upper quadrant 5 mm port was replaced with a 12 mm  port.  Using blunt and right angle dissection, the main branch of the  renal artery was able to be isolated more medially and was divided after  multiple Hem-A-Lock clips were placed onto it.  This allowed isolation  of the renal vein more medially with right angle dissection and extra  large Hem-A-Lock clips were placed across the renal vein.  A total of  three Hem-A-Lock clips were placed and the vein was then divided between  the clips and the stapler.  This allowed the renal hilum to be freed and  the upper pole of the kidney was then dissected from the adrenal gland  as Gerota's fascia was entered with a harmonic scalpel.  The renal vein  was able to be isolated just above the stapler and was ligated with a  Hem-A-Lock clips and the stapler was able to be disengaged and removed.   The upper pole attachments and lateral attachments to the kidney were  then taken  down with the harmonic scalpel.  The ureter was isolated and  dissected to the level of the common iliac artery where it was divided  between Hem-A-Lock clips.  The gonadal vein was also divided distally  between Hem-A-Lock clips, allowing the kidney specimen to be freed.  The  EndoCatch II bag was then used to remove the kidney via the right lower  quadrant incision which was slightly extended in a semilunar fashion  to  begin a possible Gibson incision if a ureterectomy was necessary.  The  kidney was removed and sent for intraoperative frozen section analysis  and did not demonstrate any findings concerning for a urothelial tumor.  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  The renal  hilum and adrenal bed were examined and hemostasis appeared excellent.  The right lower quadrant incision was closed with a running #1 PDS  suture.  The right upper quadrant port site was closed with a 0 Vicryl  suture placed with the aid of the suture passer device.  The camera port  incision was closed after all remaining ports were removed under direct  vision and was closed  externally with a running 0 Vicryl suture.  All incision sites were  injected with 0.25% Marcaine and reapproximated at the skin level with  staples.  Sterile dressings were applied.  The patient appeared to  tolerate the procedure well without complications.  She was able to be  awakened and transferred to the recovery room in satisfactory condition.      Raynelle Bring, MD  Electronically Signed     LB/MEDQ  D:  06/11/2007  T:  06/11/2007  Job:  [id number]

## 2010-12-05 NOTE — Consult Note (Signed)
NAME:  [name] [name], [name] [name]             ACCOUNT NO.:  [id number]   MEDICAL RECORD NO.:  [id number]          PATIENT TYPE:  INP   LOCATION:  8250                         FACILITY:  [facility]   PHYSICIAN:  Minus Breeding, MD, FACCDATE OF BIRTH:  1951/05/23   DATE OF CONSULTATION:  05/22/2007  DATE OF DISCHARGE:                                 CONSULTATION   PRIMARY CARE PHYSICIAN:  Dr. Orson Ape.   PRIMARY CARDIOLOGIST:  Dr. Lattie Haw.  Both of those are in [place].   UROLOGIST:  Dr. Tresa Endo and Dr. Alinda Money.   SUMMARY OF HISTORY:  [name] is a 60 year old white female  who is well-known to our practice.  She was admitted through [facility]  emergency room on the 28th by Dr. [name] in regards to a GI bleed.  She  initially presented with severe abdominal cramping associated with  diaphoresis and nausea followed by dark, foul-smelling stools, and while  sitting on the commode bending over to vomit in the trash can, she had a  syncopal episode without injury.  It is unclear as to the duration of  loss of consciousness.  She rested in bed.  However, she had another  episode of severe cramping followed by obviously bloody stools.  She saw  Dr. Rosana Hoes and was advised to come to the emergency room for further  evaluation.   During this admission, her evaluation revealed evidence of ischemic  colitis in the descending colon by EGD with discharge plans to be  anticipated for the 31st.   Recently, over the past year, the patient has had problems with nausea  and constipation.  This led to a workup that included an abdominal  ultrasound followed by a CT scan which shows findings suspicious for  renal carcinoma.  At this time, surgery is planned for November 19 to be  preceded by a repeat sigmoid colonoscopy for evaluation of her ischemic  colitis prior to her surgery.  We are asked to consult in regards to her  blood pressure medications as during this admission none of her cardiac  medications have been resumed (aspirin, Norvasc, Lasix and potassium).   PAST MEDICAL HISTORY:  ALLERGIES INCLUDE CLEOCIN WHICH CAUSES ESOPHAGEAL  SPASM, PENICILLIN CAUSES RASH AND SWELLING, CODEINE RASH, ERYTHROMYCIN  SEVERE ABDOMINAL DISCOMFORT AND RASH, KEFLEX RASH, LIPITOR AND ZOCOR  RESULT IN ELEVATED LFTS, AVANDIA AND METFORMIN MAY HAVE CONTRIBUTED TO  HER FIRST EPISODE OF CHF.   Her preadmission medications include:  1. Nexium 40 mg daily.  2. Neurontin 300 mg t.i.d.  3. Aspirin 325 mg daily.  4. Tricor 145 daily.  5. Lasix 40 mg daily.  6. KCL 10 mEq daily.  7. Januvia 100 mg daily.  8. Amaryl 4 mg q.a.m., 2 mg q.p.m.  9. Norvasc 2.5 mg b.i.d.  10.Crestor 20 q.p.m.  11.Zetia 10 q.p.m.  12.Estradiol weekly 0.075.  13.Calcium plus D 600 mg b.i.d.  14.Multivitamin b.i.d.  15.Colace 100 mg b.i.d.  16.Aldara.  17.Insulin pump.  18.Vitamin D 1000 units q.a.m.  19.Benefiber.   Her past medical history is notable for:  1. Hodgkin's disease in 80 with  removal of neck tumor and extensive      radiation.  2. Duodenal ulcer with H pylori x2 in the 80s and 90s.  3. Bilateral Bartholin cysts removed.  4. Small intestine resection in 1998 secondary to lymphoma.  5. Fatty liver disease with liver biopsy in 1999.  6. Total hysterectomy.  7. Hyperlipidemia.  Last check as far as I can tell is August 2006      which showed a total cholesterol 141, triglycerides 22, HDL 33, LDL      66.  8. Migraine headaches.  9. Coronary artery disease with stents to the RCA in May 2000, June      2000, September 2000 with brachytherapy in October 2000.  In      December 2000, she underwent one-vessel bypass surgery with      saphenous vein graft to the PDA.  Last catheterization was February 06, 2006 which was a right and left heart catheterization which showed      normal right pressures.  She had a totaled RCA, 40-50% ostial      saphenous vein graft to the PDA lesion.  EF of 60%.  Last  stress      Myoview was January 18, 2004 that showed an EF of 65% and no ischemia.      Last echocardiogram May 13, 2007 that showed mild AS and left      atrial enlargement with normal LV function, bicuspid aortic valve.  10.She has a history of CHF in July 2005, possibly precipitated by      medications, later diagnosed with constrictive pericarditis.  11.Syncope in January 2006 evaluated at [facility] in [place].  It      is also noted that Dr. Lattie Haw dictated a letter on May 12, 2007 to her primary care physician with copies to Dr. Rosana Hoes      indicating that from a cardiac standpoint Ms. Bambie is cleared for      surgery.  12.Hypertension.   SOCIAL HISTORY:  The patient resides in [place] with her husband.  She  has two children and two grand children.  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  She does not smoke, does not drink alcohol,  use illicit drugs or herbal medications.  She does maintain a low salt  and sugar diet.   FAMILY HISTORY:  Mother died at the age of 41 with a myocardial  infarction and history of COPD and tobacco use.  Her father died at the  age of 35 with a myocardial infarction and history of diabetes.  She has  two half-sisters, both are deceased, one with breast cancer, the other  with lung cancer.  She has one brother living with hypertension.   REVIEW OF SYSTEMS:  In addition to above is notable for sweats, glasses,  rare dyspnea on exertion, occasional pedal edema which primarily  resolves at night, rare coughing, postmenopausal left hip arthralgias.   PHYSICAL EXAMINATION:  GENERAL:  Well-nourished, well-developed,  pleasant, white female in no apparent distress.  Husband is present.  T-  max is 98.3, temperature is 97.2, pulse 88, respirations 18, blood  pressure 100/59, 96% saturation on room air.  I&Os are approximately  3600/1300.  Admission weight was 58.110 kg.  HEENT:  Is grossly unremarkable.  NECK:  Supple  without thyromegaly, adenopathy or JVD.  She does have  bilateral carotid bruits.  CHEST:  Symmetrical excursion, and lungs  were clear to auscultation  without rales, rhonchi or wheezing.  HEART:  PMI is not displaced.  Regular rate and rhythm.  She does have a  2/6 systolic murmur best appreciated at the left sternal border and apex  and radiating into the carotids  SKIN:  Integument is intact.  ABDOMEN:  Slightly rounded, bowel sounds present without organomegaly,  masses or tenderness.  EXTREMITIES:  Negative cyanosis, clubbing or edema.  Did not appreciate  any femoral or abdominal bruits.  All pulses are symmetrical and intact.   Chest x-ray was not done on this admission.  EKG shows normal sinus  rhythm, first-degree AV block, early R wave; old EKGs are not available  for comparison.  Admission H&H is 14.2 and 41.9, platelets 233, WBC  16.9.  Sodium 140, potassium 4.1, BUN 11, creatinine 0.6, glucose 132.  Normal LFTs.  Hemoglobin A1c was elevated at 18, negative point of care  marker x1.  Urine was positive for glucose.   IMPRESSION:  Ischemic colitis with during this hospitalization the  holding of her cardiac medications.  After discussing with Dr. [name],  we would like to continue her aspirin at a dose of 81 mg daily instead  of 325 mg daily.  We will hold her Norvasc and give her instructions on  p.r.n. Lasix and potassium usage.  She has been cleared by Dr. Lattie Haw  from a cardiac perspective for her upcoming surgery.  This recent  hospitalization has not changed our opinion.     On her pink discharge sheet, as discharge is anticipated tomorrow, I  have given her instructions in regards to daily weights, maintaining a  blood pressure diary for continual following during this period.  During  her hospitalization for her kidney surgery, please do not hesitate to  call us if any cardiac issues should arise.      Sharyl Nimrod, PA-C      Minus Breeding, MD,  [facility]  Electronically Signed    EW/MEDQ  D:  05/22/2007  T:  05/23/2007  Job:  [id number]   cc:   Leonides Grills, M.D.  Fax: [place] Lattie Haw, [place], [place]  [place], [place] [address]   [place] Rosana Hoes, M.D.  Fax: [phone]   Raynelle Bring, MD  Fax: [phone]   [address] Chuck. [address]rene Pastor, [address]  [place]  [place] [address]   Gatha Mayer, MD,FACG  [place]  [place], [place] [address]   Bonne Dolores, M.D.  Fax: [phone]

## 2010-12-05 NOTE — Letter (Signed)
Dec 02, 2008    [name] Lincoln, MD  [place]  [place], [place]   RE:  [name] [name]  MRN:  [id number]  /  DOB:  10/17/1950   Dear Rush [place]mark:   [name] [name] returns to the office for continued assessment and  treatment of coronary artery disease and chest discomfort.  Since her  last visit, she has improved.  Use of amlodipine 2.5 mg daily has  reduced the frequency and severity of her episodes of chest discomfort.  She has noted an increase in pedal edema, however.   A stress echocardiogram was performed.  She had impaired exercise  capacity, but no evidence for myocardial ischemia.   Her medications are unchanged except for the addition of amlodipine.   [name] [name] notes episodic cough, sometimes with scant yellow sputum  production.  She has had nasal congestion and postnasal drip.  She  attributes these symptoms to allergy.   PHYSICAL EXAMINATION:  GENERAL:  On exam, pleasant, trim woman, in no  acute distress.  VITAL SIGNS:  The blood pressure is 125/65, heart rate 85 and regular,  respirations 11 and unlabored.  NECK:  No jugular venous distention; normal carotid upstrokes with  transmitted murmur bilaterally.  LUNGS:  Clear.  CARDIAC:  Normal first heart sound; absent aortic component of the  second heart sound; grade 3/6 mid peaking systolic ejection murmur at  the cardiac base.  ABDOMEN:  Soft and nontender; no organomegaly.  EXTREMITIES:  No significant edema; distal pulses intact.   Her echocardiogram shows moderately severe aortic stenosis with a peak  gradient of 38 mmHg, unchanged from a previous study 2 years earlier.   IMPRESSION:  [name] [name] is doing better with modification of her  medical regime.  I suggested she could increase amlodipine further to 5  mg daily to determine if this resulted in further improvement.  Unfortunately, this may cause additional edema as well.  We also  discussed possible substitution of verapamil  - she is concerned about  this due to preexisting constipation.  We discussed the use of diltiazem  as well.  For the time being, she will increase amlodipine only if she  needs it and use furosemide 20 - 40 mg daily for edema along with her  potassium supplements 10 mEq daily when furosemide is taken.  She  returned a list of blood pressures, all of which are quite good.  I will  plan to reassess this nice woman in 3 months.    Sincerely,      Cristopher Estimable. Lattie Haw, MD, [facility]  Electronically Signed    RMR/MedQ  DD: 12/02/2008  DT: 12/03/2008  Job #: [id number]

## 2010-12-05 NOTE — Letter (Signed)
May 25, 2008    Bonne Dolores, MD  [address] [place]  [place], [place] [address]   RE:  KATIANNE, BARRE  MRN:  [id number]  /  DOB:  02-28-1951   Dear Elta [place],   Ms. Lengel returns to the office somewhat beyond her anticipated  followup, which was scheduled for January 2009.  Unfortunately, my  office staff neglected to inform her.  She has done well from a cardiac  standpoint.  Her only serious medical issue resulted from a [place]  She clearly tripped and fell on  gravel resulting in significant abrasions of her knees and elbows.  She  experienced pain at the time of the fall and believes that she  temporally lost consciousness, but did not apparently suffer head  trauma.  She was evaluated in the emergency department with x-rays,  wound care, and a CT scan of her head.  She required immobilization of  her left knee, but all of her problems have now resolved.  At the  present time, she is noting multiple episodes of diaphoresis at night  associated with tachy palpitations and a sense of anxiety.  This has  resulted in inadequate sleep and a sense of fatigue during the day.  She  has been treated by Dr. [name] with Zegerid for possible reflux, but  this has resulted in no benefit.  She has checked glucose values during  these episodes - they have been greater than 100.   Current medications include:  1. Rosuvastatin 20 mg daily.  2. Acetamide 10 mg daily.  3. Estradiol 0.075 mg per 24 hours transdermally.  The dose of this      medication was increased, also with no effect on her diaphoretic      spells.  4. She is also taking Caltrate with vitamin D 2 daily.  5. Multivitamin.  6. Aspirin 81 mg daily.  7. Januvia 100 mg daily.  8. TriCor 145 mg daily.  9. Apidra via insulin pump.  10.Vitamin D 1000 units daily.  11.Benefiber.  12.Zegerid.  13.She has furosemide for p.r.n. use, but has not noted pedal edema in      quite  some time.   PHYSICAL EXAMINATION:  GENERAL:  Trim, pleasant woman in no acute  distress.  VITAL SIGNS:  The weight is 122, 5 pounds less than last year.  Blood  pressure 145/70, heart rate 90 and regular, respirations 14.  NECK:  No jugular venous distention; transmitted murmur bilaterally.  LUNGS:  Clear.  CARDIAC:  Harsh grade 3/6 mid peaking systolic ejection murmur; absent  aortic component of the second heart sound.  ABDOMEN:  Soft and nontender; no organomegaly.  EXTREMITIES:  No edema; normal distal pulses.   Echocardiogram from last year showed mild-to-moderate aortic stenosis  that had not progressed since 2005.  She has borderline LVH with normal  systolic function.   EKG:  Normal sinus rhythm; left atrial abnormality; lateral ST-T-wave  abnormality; probable LVH.  There is borderline QT prolongation.  No  change when compared to her previous tracing of May 12, 2007.   IMPRESSION:  Ms. Zuccaro is doing well from a cardiac standpoint.  She  is experiencing adrenergically mediated symptoms at night, perhaps  related to anxiety.  We will try beta-blocker at low dose, metoprolol  starting at 25 mg daily and then increasing to 50 mg daily if necessary.  I have also asked her to try a low-dose benzodiazepine  nightly,  initially Xanax at a dose of 0.25 mg daily, then 0.5 mg, then 1.0 mg if  necessary.  I will assess the response to these treatments at a return  visit in 1 month.    Sincerely,      Cristopher Estimable. Lattie Haw, MD, [facility]  Electronically Signed    RMR/MedQ  DD: 05/25/2008  DT: 05/26/2008  Job #: [id number]   CC:    Juanda Bond. [place], M.D.

## 2010-12-05 NOTE — Assessment & Plan Note (Signed)
OFFICE VISIT   [name] [name], [name] [name]  DOB:  05-Dec-1950                                        July 26, 2009  CHART #:  [id number]   The patient returned today for further discussion of planned redo  sternotomy with a redo coronary bypass to the right coronary artery and  aortic valve replacement scheduled for tomorrow.  I last saw her on  July 05, 2009.  Since then, she said she has been feeling about the  same with fatigue and exertional shortness of breath.  She has also  noted that her diabetes has been difficult to control with increasing  insulin requirements over the past couple of weeks.  She has also  noticed increased swelling in her lower body and has been taking Lasix  for that.   On physical examination today, her blood pressure 113/69, pulse is 89  and regular, and respiratory rate is 16 unlabored.  Oxygen saturation on  room air is 98%.  Her lungs are clear.  Cardiac exam shows regular rate  and rhythm with a harsh 3/6 systolic murmur over the aorta and a 1/6  diastolic murmur of aortic insufficiency.  There is no significant  peripheral edema.   We reviewed the planned surgery for tomorrow.  We discussed the options  for valve replacement and prosthesis including mechanical and tissue  valves.  She would like to use a tissue valve, so that she would not  have to be on Coumadin.  I agree with that decision despite her age of  51, given the multiple risk factors for Coumadin therapy in this  patient.  I elucidated all these in my previous consult note.  Her and  her husband understand there is a risk of structural valve deterioration  par in agreement that the risk of anticoagulant-related bleeding is  higher in her case and the risk of structural valve deterioration.  We  discussed the benefits and risks of surgery again including, but not  limited to bleeding, blood transfusion, infection, stroke, myocardial  infarction, heart block  requiring permanent pacemaker, graft failure,  and death.  She is in agreement to proceed with surgery tomorrow.   Gilford Raid, M.D.  Electronically Signed   BB/MEDQ  D:  07/26/2009  T:  07/26/2009  Job:  [id number]

## 2010-12-05 NOTE — Assessment & Plan Note (Signed)
[place] OFFICE NOTE   NAME:[name], [name] CAISSE                    MRN:          [id number]  DATE:10/15/2007                            DOB:          Jan 02, 1951    CHIEF COMPLAINT:  Followup of gastroparesis, reflux, hemorrhoids.   1. Gastroparesis:  This is better.  If she forgets her metoclopramide,      she will have some regurgitation and vomiting problems.  No      neurologic side effects are noted, though she has had some      headaches at night.  She does not have any signs of tardive      dyskinesia or neuropathy.  We discussed the long-term possible side      effects of metoclopramide, but will continue that, knowing that it      is really the effective medication for her at this time.  The most      likely etiologies of this are either previous radiation and      diabetes.  The possibility of some viral illness contributing to      the recent problems does exist.  She is to try to see if she can      wean from the metoclopramide, at least during the day, over time,      and see me back in six to nine months, by the end of the year, just      for a recheck, sooner if needed.  She will discuss headaches with      her primary care physician.  They are sort of frontal headaches.      She has had two or three since I saw her four to six weeks ago.  2. Reflux:  She is requiring some occasional Tums.  She did not add a      Prilosec OTC before supper, as previously recommended.  We will go      ahead and put her on omeprazole 20 mg before supper.  This may      help.  She is still sleeping in a chair.  Certainly, the      gastroparesis and reflux are inter-related.  She did not have any      esophageal mucosal damage at endoscopy.  3. Hemorrhoids.  The Analpram cream has helped quite a bit.  She is      using that a couple of times a month and we gave her some more      samples for treatment of her  internal hemorrhoids.   Overall, she is significantly improved.  She will see me by the end of  the year on a routine basis and follow up with her regular physicians  otherwise.  I will see her back as needed.   Note:  Her weight was 129 pounds, pulse 88, blood pressure 124/54.   MEDICATIONS:  Listed and reviewed in the chart.   ALLERGIES:  Listed and reviewed in the chart.   STUDIES:  Since I last saw her, she had a solitary gallstone in the  fundus of her gallbladder that I  do not think is symptomatic.  It was an  otherwise negative ultrasound.  She did have some glucose in her urine.  She is scheduled to follow up with Dr. Elyse [name] regarding diabetes.     [name] Mayer, MD,FACG  Electronically Signed    CEG/MedQ  DD: 10/15/2007  DT: 10/15/2007  Job #: [id number]   cc:   [name] [name], M.D.  [name] [name], M.D.  [name] Bring, MD

## 2010-12-05 NOTE — Assessment & Plan Note (Signed)
[place] OFFICE NOTE   NAME:Withey, PEYSON DELAO                    MRN:          [id number]  DATE:08/14/2007                            DOB:          1950-08-05    CHIEF COMPLAINT:  Follow up of vomiting.   The metoclopramide is her helping her vomiting quite a bit. She had to  go to 10 mg before supper, but she is taking 5 mg before breakfast and  lunch. She is having some reflux and regurgitation at night in waking  up. She is not on a proton pump inhibitor at this time. She is having  some left flank pain that is helped by a heating pad. There is no  trauma. She says that it does not feel a muscle pull. She thought it was  kidney. Dr. Gerarda Fraction checked a BMET and she had a creatinine of 1.89 on  1/15 which was up from 1.1 and then she was down to 1.23 with a BUN of  25 on 1/19. She has noted no hematuria or dysuria. Her hemoglobin was  11.4 with an MCV of 90 on January 2nd. She is having some mild  discomfort when she defecates and an occasional spot of red blood on the  toilet paper and she has seen some blood streaked on the stool which was  a little darker than bright red she says. Overall, she is much better.  Her endoscopy of 07/16/2007 showed non-erosive gastritis, it was  otherwise okay. I refer to previous notes of 07/15/2007 for her past  medical history.   MEDICATIONS:  Otherwise listed and reviewed in my charts.   PHYSICAL EXAMINATION:  VITAL SIGNS:  Weight 125 pounds, pulse 88, blood  pressure 130/70.  ABDOMEN:  Soft and nontender, no organomegaly or mass. She does have  some left CVA tenderness which is mild to moderate.  RECTAL:  In the presence of female medical staff shows some hemorrhoids  in the canal. They are not large. I can see them just bulging at the  anoderm and they are slightly violaceous in color. Stool is brown and  scanty.   ASSESSMENT:  1. Vomiting perhaps related to  gastroparesis, she also had some      persistent symptoms with reflux.  2. Left flank pain unclear etiology.  3. Rectal bleeding which I think is hemorrhoidal. She just had a      colonoscopy a few months ago. There were no active hemorrhoids,      though she did have ischemic colitis which we did document that had      resolved.   PLAN:  1. Analpram cream for her hemorrhoids, samples given.  2. Add metoclopramide 5 mg at bedtime, continue to observe for any      neurologic defects or side effects, I do not see any.  3. Abdominal ultrasound regarding the left lower quadrant and flank      pain. I would like to make sure that she does not have any      hydronephrosis or anything like that. She had been in contact with  Dr. Alinda Money who was monitoring things though that was only through      his office nurse.  4. Urinalysis will be checked. This is checked which shows that she is      spilling glucose, but is otherwise negative.   PLAN:  I will contact her after the ultrasound is done, otherwise as  above. I will see her back in approximately 4 to 6 weeks. She could add  Prilosec before supper if the addition of the metoclopramide does not  help.     Gatha Mayer, MD,FACG  Electronically Signed    CEG/MedQ  DD: 08/14/2007  DT: 08/15/2007  Job #: [id number]   cc:   Raynelle Bring, MD  Sherrilee Gilles. Gerarda Fraction, MD  Juanda Bond. Altheimer, M.D.

## 2010-12-05 NOTE — Assessment & Plan Note (Signed)
[facility] OFFICE NOTE   NAME:Ewell, PALMYRA ROGACKI                    MRN:          [id number]  DATE:07/15/2007                            DOB:          [birth year]    CHIEF COMPLAINT:  Nausea, vomiting, abdominal pain.   Ms. Cowman is known to me from ischemic colitis problems prior to a  nephrectomy for renal cell carcinoma.   PROBLEM LIST:  1. Diabetes mellitus type 2, insulin requiring, labile, on an insulin      pump since October 2007, followed by Dr. Elyse Hsu.  2. Dyslipidemia, fatty liver, with liver biopsy in 1999.  3. Atherosclerotic heart disease, coronary artery bypass grafting,      bicuspid aortic valve, diastolic dysfunction.  4. Hodgkin's lymphoma, 1981, status post removal of neck lesion and      mantle radiation therapy.  5. Prior total abdominal hysterectomy and bilateral salpingo-      oophorectomy.  6. History of mild vitamin D deficiency and osteopenia.  This was at      the hip on DEXA in October 2007.  7. Ischemic colitis, October 2008.  8. Colonoscopy followup, June 05, 2007.  Two small polyps removed.      These were polyps or tubular adenomas.  Plan repeat colonoscopy in      5 years.  9. Status post nephrectomy, Dr. Alinda Money, on June 11, 2007.  Renal      cell carcinoma.  10.No retinopathy or peripheral neuropathy described, though there is      a history of Neurontin use.  I believe that was for other pains.  11.Anemia.  12.Treated for gastroesophageal reflux disease in the past.  13.Dyslipidemia.  14.Hypertension.  15.Allergies.  16.History of congestive heart failure, with constrictive      pericarditis.  17.Mild to moderate aortic stenosis.  18.History of migraine headaches.  19.Remote history of peptic ulcer disease.   ALLERGIES:  1. CLEOCIN.  2. PENICILLIN.  3. CODEINE.  [place].  5. LIPITOR.  6. ZOCOR.  7. AVANDAMET/METFORMIN (congestive heart  failure).   MEDICATIONS:  1. Glimepiride 4 mg morning, 2 mg evening.  2. Furosemide 40 mg p.r.n.  3. Klor-Con 10 mEq p.r.n.  4. Crestor 20 mg daily.  5. Insulin pump.  6. Zetia 10 mg daily.  7. Estradiol 0.075 mg patch per day.  8. Caltrate with vitamin D daily.  9. Multivitamin b.i.d. p.r.n.  10.Aspirin 81 mg daily.  11.Januvia 100 mg daily.  12.Nasonex p.r.n.  13.Nitroglycerin p.r.n.  14.MiraLax p.r.n.   INTERVAL HISTORY:  She describes a nausea problem since the spring of  the year which has developed into some postprandial fullness, early  satiety, and bloating.  She had vomited intermittently times and has  vomited partially or nearly undigested food up to hours after eating.  There is also some changing bowel habits, with some loose stools,  slightly urgent, with some left lower quadrant cramps and pain, as well  as constipation, since he surgery.  She did have a HIDA scan with  ejection fraction performed recently that was unremarkable on July 14, 2007.  No other imaging since then.  Labs from Dr. Elyse Hsu  recently on July 11, 2007 showed her CBC to be hemoglobin 10.4,  white count was normal, platelets normal, MCV normal, glucose 130, BUN  19.  Hemoglobin A1c 6.5%, normal fructosamine.  C-reactive protein 3.14,  which is slightly up.  TSH normal.  Vitamin D 34.4.   Prilosec seems to make things worse.  Her blood sugars have been  reasonably okay, though they were low postoperatively.  She has  developed a restless leg-like complaint since her surgery.  She denies  peripheral neuropathy symptoms, i.e., no pins and needles, paresthesias,  etc.   PHYSICAL EXAMINATION:  GENERAL:  Reveals a well-developed, healthy-  appearing white woman in no acute distress.  VITAL SIGNS:  Weight 126 pounds, pulse 80, blood pressure 128/54.  HEENT:  The eyes are anicteric.  CHEST:  Clear.  HEART:  S1, S2.  no murmurs, rubs, or gallops.  ABDOMEN:  Soft, nontender, except mild  left lower quadrant tenderness,  without organomegaly, mass, or rebound.  Bowel sounds are present.  There is no succussion splash.  EXTREMITIES:  Free of edema.  SKIN:  Skin in the trunk is free of acute rash.  NEUROLOGIC:  She is alert and oriented x3.   ASSESSMENT:  Epigastric pain is also a problem not described above.  Nausea, vomiting, intermittent, progressively worse.  This seems to be a  separate issue than the alternating bowel habits that I suspect are  somewhat an irritable bowel phenomenon post ischemic colitis, perhaps.   PLAN:  1. Investigate the epigastric pain, nausea, vomiting with an EGD.      Ulcer disease is possible, though I think gastroparesis is possible      as well.  Treatment plan is pending that.  We will perform that      tomorrow.  She understands the risks, benefits, and indications,      and she will continue her basal insulin pump infusion and turn it      off as needed in the fasting period.  2. Monitor the alternating bowel habits.  We will discuss those      further tomorrow.  It could need further workup of that, though I      suspect irritable bowel difficulty.  She has been a little gassy as      well.  A probiotic or perhaps empiric metronidazole could prove      useful, versus another antibiotic.  I do not really suspect      Clostridium difficile, since she does feel constipated at times.      Further plans pending the above.     Gatha Mayer, MD,FACG  Electronically Signed    CEG/MedQ  DD: 07/15/2007  DT: 07/15/2007  Job #: [id number]   cc:   Sherrilee Gilles. Gerarda Fraction, MD  Juanda Bond. [place], M.D.  Raynelle Bring, MD

## 2010-12-05 NOTE — Consult Note (Signed)
NEW PATIENT CONSULTATION   [name] [name], [name] [name]  DOB:  Sep 16, 1950                                        July 05, 2009  CHART #:  [id number]   REFERRING PHYSICIAN:  Cristopher Estimable. Lattie Haw, MD, [facility]   REASON FOR CONSULTATION:  Severe aortic stenosis.   CLINICAL HISTORY:  I was asked by Dr. Lattie Haw to evaluate the patient  for consideration of redo sternotomy and aortic valve replacement.  She  is a 60 year old woman who underwent coronary artery bypass graft  surgery x1 with a saphenous vein graft to the distal right coronary  artery by me on July 07, 1999.  This was performed after a long  history of coronary disease with at least 5 percutaneous interventions  including placement of 2 stents, Rotablator procedures, and  intracoronary radiation for restenosis.  She did well after that  procedure.  For the past few years, she has had history of intermittent  chest discomfort with exertion and was diagnosed with aortic stenosis.  This has been followed with periodic echocardiogram.  She said that over  the past year or so she has developed more frequent episodes of chest  discomfort with exertion responding to sublingual nitroglycerin.  She  has also had exertional dyspnea and fatigue as well as exertional and  positional dizziness and one episode of near syncope.  She said she has  totally rearranged her lifestyle and activity level to try to avoid  these symptoms.  An echocardiogram a few months ago showed a mean  gradient of 27 with a peak gradient of 42.  She was diagnosed with  moderate aortic stenosis.  She underwent a cardiac MR to further  evaluate her aortic stenosis and this was felt to moderate aortic  stenosis by cardiac MRI.  She also underwent a nuclear stress test which  was negative for myocardial ischemia.  She had impaired exercise  capacity with reproduction of her symptoms with exercise.  She underwent  cardiac catheterization on May 03, 2009, which showed a mean  gradient across the aortic valve of 22 mmHg.  There is 2-3+ mitral  regurgitation which was felt to be at least partially catheter induced.  Her left ventricular ejection fraction was 75% with no wall motion  abnormalities.  There was midcavity collapse with a midcavity gradient.  Coronary angiography showed occlusion of the right coronary artery at  the ostium.  There was patent vein graft to the right coronary artery  which had a 50% ostial and proximal stenosis over a short distance.  The  left coronary system had mild irregularities with no significant  disease.  She subsequently underwent a transesophageal echocardiogram by  Dr. Dannielle Burn on June 21, 2009, to try that further elucidate the  degree of aortic stenosis.  This showed an ejection fraction of 65%.  There was mild-to-moderate concentric left ventricular hypertrophy.  Right ventricular function was normal.  The aortic valve was trileaflet  with severe thickening and reduced excursion of the leaflets.  There was  moderate aortic leaflet calcification.  There was fusion in the right  noncoronary cusp.  There was felt to be severe aortic stenosis with an  aortic valve area calculated at 0.45 sq. cm.  There was mild-to-moderate  mitral regurgitation with normal-appearing mitral valve leaflets.  There  was mild tricuspid regurgitation.  No pulmonary hypertension was noted.  There was no evidence of a shunt or PFO.   REVIEW OF SYSTEMS:  GENERAL:  She denies any fever or chills.  She has  had some weight gain.  She has had normal appetite.  She does report  fatigue.  EYES:  She has had some recent changes in her eyesight.  ENDOCRINE:  She has adult-onset diabetes since age 60.  She is on  insulin pump and continuous insulin monitoring.  She said her diabetes  control has been poor over the past year with marked increasing in the  amount of insulin she has had to use.  CARDIOVASCULAR:  She does  report exertional substernal chest pressure  and tightness as well as exertional shortness of breath.  She has some  orthopnea.  She denies palpitations and peripheral edema.  RESPIRATORY:  She denies cough and sputum production.  GI:  She has had no nausea or vomiting.  She does have a history of  prior duodenal ulcer disease.  She has a history of reflux.  She has a  history of some dysphagia and underwent a barium swallow showing some  impingement on her cervical esophagus by an osteophyte.  She has a  history of irritable bowel syndrome with some diarrhea and constipation.  She has a history of gallstones.  She has a history of ischemic colitis  with lower GI bleeding in the past.  GU:  She has a history of hypernephroma and underwent right nephrectomy  for that in the past couple years.  VASCULAR:  She denies claudication and phlebitis.  She does have some  leg cramps at night.  She has never had a TIA or a stroke.  NEUROLOGIC:  She does have some exertional dizziness as well as with  bending over.  She had one presyncopal episode in September.  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  PSYCHIATRIC:  Negative.  HEMATOLOGIC:  She has a history of mild anemia.  She has no bleeding  disorders.   ALLERGIES:  Cleocin, Keflex, penicillin, erythromycin, codeine, Lipitor,  Zocor, Avandia, and metformin.   MEDICATIONS:  1. Flonase 2 sprays at bedtime p.r.n.  2. Lasix p.r.n.  3. Potassium p.r.n.  4. TriCor 145 mg daily.  5. Januvia 100 mg daily.  6. Diovan 80 mg daily.  7. Norvasc 2.5 mg daily.  8. Crestor 20 mg daily.  9. Zetia 10 mg q.p.m.  10.Xanax 0.5 mg at bedtime p.r.n.  11.Insulin pump 140-160 average daily units.  12.Calcium 600 Plus D 2 daily.  13.Vitamin D 1000 units daily.  14.Multivitamin b.i.d.  15.Omega-3 fish oil 1000 mg daily.  16.Benefiber 1 tablespoon every other day.  17.Toprol 25 mg q.p.m.  18.Aspirin 81 mg q.p.m.   PAST MEDICAL HISTORY:   Significant for diabetes, hypertension,  hypercholesterolemia.  She has a history of coronary disease status post  coronary bypass surgery in 2000.  She had a long history of coronary  disease prior to that status post percutaneous intervention times at  least 4.  She has a history of Hodgkin disease in 67 status post  radiation therapy.  She has history of renal cell carcinoma in 2008  status post laparoscopic right nephrectomy.  She has a history of  ischemic colitis on May 20, 2007, felt to be secondary to a drop in  her blood pressure.  She was treated by Dr. [name].  She has a history  of colonic polyps.  She is status  post hysterectomy and BSO.  She has a  history of cyst removal from her right breast.  She is status post  lipoma removal from her abdominal wall.  She has a history of duodenal  ulcer disease in the late 80s.  She has a history of irritable bowel  syndrome.  She has a history of prior MRSA infection in July 2010  treated with doxycycline for 20 days.  She has history of gallstones.   FAMILY HISTORY:  She has history of cardiovascular disease in her mother  and father as well as her sister.   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  She has 2 children.  She has never smoked.  Denies alcohol use.   PHYSICAL ELIMINATION:  Vital Signs:  Her blood pressure is 129/67, pulse  89 and regular, respiratory rate is 18 and unlabored.  Oxygen saturation  on room air is 96%.  She is a well-developed white female in no  distress.  HEENT:  Normocephalic and atraumatic.  Pupils are equal and  reactive to light and accommodation.  Extraocular muscles are intact.  Throat is clear.  Neck:  Normal carotid pulses bilaterally.  There is no  bruit.  There is no adenopathy or thyromegaly.  Cardiac:  Regular rate  and rhythm with normal S1 and a soft S2.  There is a 3/6 systolic murmur  over the aorta and a 1/6 diastolic murmur of aortic  insufficiency.  There is a well-healed sternotomy scar.  Lungs:  Clear.  Abdomen:  Active bowel sounds.  Her abdomen is soft and nontender.  There are no  palpable masses or organomegaly.  There is midline laparotomy scar as  well as a lower abdominal scar.  There is a right lateral abdominal  ventral incisional hernia.  Extremities:  No peripheral edema.  Pedal  pulses are palpable bilaterally.  There is an old vein harvest incision  from the right lower leg.  Neurologic:  Alert and oriented x3.  Motor  and sensory exams are grossly normal.   IMPRESSION:  The patient has severe aortic stenosis by recent  transesophageal echocardiogram with multiple prior studies showing  moderate aortic stenosis.  She certainly has severe symptoms  attributable to aortic stenosis.  She also has about 50% ostial and  proximal stenosis of a saphenous vein graft to the occluded right  coronary artery.  The remainder of the vein graft looks good.  I doubt  that this stenosis is causing any hemodynamically significant  obstruction of flow.  It is concerning to me that she is having angina,  exertional dyspnea, and dizziness with severe aortic stenosis.  I agree  that the best treatment for her is redo sternotomy and aortic valve  replacement and possibly replacement of the vein graft to her right  coronary artery.  We frequently have to replace the proximal portion of  this graft anyway since the aortotomy may go through the area of the  proximal anastomosis.  I would plan to evaluate that bypass graft in the  operating room to decide if it warrants replacement.  I discussed the  procedure of aortic valve replacement with her and her husband.  We  discussed the pros and cons of mechanical and tissue valves.  She is  only 60 years old and therefore would typically be a candidate for  mechanical valve.  She does have multiple coexisting medical problems  that would increase her risk of  chronic anticoagulation  with Coumadin.  These include the fact that she uses an insulin pump with a subcutaneous  needle as well as a continuous glucose monitor with a subcutaneous  needle.  She would be much more likely to develop hematomas at the  injection sites which may be a long-term problem for her.  She also has  a history of duodenal ulcer disease with bleeding in the past as well as  ischemic colitis with bleeding in the not too distant past.  She said  she also has a history of gallstones and a ventral incisional hernia  that may need to be repaired surgically.  I think that all of these  factors would increase her risk of anticoagulation-related bleeding and  was certainly complicating her long-term management.  I also discussed  the 1-2% per year risk of thromboembolism with a mechanical valve.  She  said that she would prefer to have a tissue valve if possible and fully  understands the risk that she may have structural valve deterioration  requiring further valve replacement in the future.  I would estimate  that her risk of structural valve deterioration requiring valve  replacement over the next 15 years would be about 25% with a pericardial  tissue valve.  I do not think that she is a candidate for a minimally  invasive approach to aortic valve replacement since this is a redo  operation.  She understands this.  I discussed the possibility of  replacing the right coronary vein graft if I feel it is necessary at the  time of surgery.  I discussed the other benefits and risks of surgery  including but not limited to bleeding, blood transfusion, infection,  stroke, myocardial infarction, organ failure, heart block requiring  permanent pacemaker, and death.  She understands all this and would like  to proceed  with surgery in early January.  We have scheduled her for Wednesday,  July 27, 2009, and I will plan to see her back in the office the day  prior to surgery to answer any further  questions.   Gilford Raid, M.D.  Electronically Signed   BB/MEDQ  D:  07/05/2009  T:  07/06/2009  Job:  [id number]   cc:   Cristopher Estimable. Lattie Haw, MD, [facility]  Bonne Dolores, M.D.

## 2010-12-05 NOTE — Letter (Signed)
May 12, 2007    Leonides Grills, M.D.  [address], [place] [address]   RE:  [name], [name]  MRN:  [id number]  /  DOB:  October 27, 1950   Dear Rush Landmark:   [name] [name] returns to the office after having been informed of an  incidentally detected right renal mass.  She was under assessment by Dr.  Laural Golden for chronic nausea.  An abdominal ultrasound revealed a renal  abnormality that was further defined by CT scanning.  She has also been  found to have a small thyroid goiter with localization of tracer in the  mediastinum.  Further assessment of this finding is in progress.  She  has had some very atypical left upper chest discomfort that has  responded to Neurontin.  She notes no dyspnea.   MEDICATIONS:  Unchanged from her last visit except for the addition of:  1. Neurontin with uncertain dose.  2. Doxycycline.  3. Nexium 40 mg daily.   On exam, a pale, pleasant, trim woman in no acute distress.  The weight  is 127, one pound less than in July.  Blood pressure 120/60, heart rate  85 and regular, respirations 16.  NECK:  No jugular venous distention; normal carotid upstrokes with  transmitted murmur bilaterally.  LUNGS:  Clear.  CARDIAC:  Normal first heart sound, decreased to absent aortic component  of the second heart sound; grade 3/6 early to mid peaking systolic  ejection murmur at the cardiac base.  ABDOMEN:  Soft and nontender; no organomegaly; no masses.  EXTREMITIES:  Normal distal pulses; no edema.   EKG:  Normal sinus rhythm; left atrial abnormality; delayed R wave  progression; possible LVH; minimal nonspecific ST-T wave abnormality.  When compared to a prior tracing of October 26, 2004, left atrial  abnormality is now present; QRS voltage has increased.   IMPRESSION:  [name] [name] has aortic stenosis that was mild when last  assessed by Doppler in 2005.  We will repeat a cardiac ultrasound study.  She also has known coronary disease, but has done well since  single  vessel bypass surgery in December of 2000.  Her most recent cardiac  catheterization in 2007 revealed total obstruction of the right coronary  artery with a widely patent saphenous vein graft to the posterior  descending artery.  There was no significant atherosclerotic disease in  the left system.   [name] [name] is doing well from a cardiovascular standpoint.  Depending  upon her echocardiographic findings, she may have some mildly increased  surgical risk for a nephrectomy; however, this clearly would not be  prohibitive.  Current medication is appropriate.  I do not think that  initiation of treatment with a beta-blocker would result in any  significant decrease in perioperative risk.  We would be happy to assist  with this nice woman's care when she is admitted to hospital for her  urologic surgery.  An echocardiogram will be obtained to assess any  progression of aortic stenosis.    Sincerely,      Cristopher Estimable. Lattie Haw, MD, [facility]  Electronically Signed    RMR/MedQ  DD: 05/12/2007  DT: 05/13/2007  Job #: [id number]   CC:    Jori Moll L. Rosana Hoes, M.D.

## 2010-12-05 NOTE — Procedures (Signed)
NAME:  SHAQUANDRA, GALANO             ACCOUNT NO.:  [id number]   MEDICAL RECORD NO.:  [id number]          PATIENT TYPE:  OUT   LOCATION:  RAD                           FACILITY:  [facility]   PHYSICIAN:  Cristopher Estimable. Lattie Haw, MD, FACCDATE OF BIRTH:  Apr 25, 1951   DATE OF PROCEDURE:  05/13/2007  DATE OF DISCHARGE:  05/13/2007                                ECHOCARDIOGRAM   CLINICAL DATA:  A 60 year old woman with aortic stenosis.  Aorta 2.5,  left atrium 4, septum 1.1, posterior wall 1, LV diastole 3.8, LV systole  2.9.  1. Technically adequate echocardiographic study.  2. Mild left atrial enlargement; normal right atrium and right      ventricle.  3. Normal diameter of the proximal ascending aorta and aortic arch.      Mild calcification of the wall.  4. Moderate sclerosis/calcification of the aortic valve leaflets with      mild-to-moderate impairment in leaflet separation.  Mild stenosis      by Doppler with a peak gradient of approximately 40 mmHg..  5. Normal tricuspid valve; physiologic regurgitation; estimated RV      systolic pressure at the upper limit of normal.  6. Normal pulmonic valve and proximal pulmonary artery.  7. Normal mitral valve; mild to moderate annular calcification; very      mild regurgitation.  8. Normal left ventricular size; borderline hypertrophy; normal      regional and global function.  9. Normal IVC.  10.Comparison with prior study of January 17, 2004:  No progression of      aortic valve disease.  No significant interval change.      Cristopher Estimable. Lattie Haw, MD, [facility]  Electronically Signed     RMR/MEDQ  D:  05/14/2007  T:  05/15/2007  Job:  [id number]

## 2010-12-05 NOTE — Letter (Signed)
July 06, 2008    Leonides Grills, MD  [address], [place] [address]   RE:  [name] [name], [name] [name]  MRN:  [id number]  /  DOB:  1951-03-29   Dear Rush Landmark,   Ms. Hampshire is seen in the office as scheduled, but she also requested  evaluation prior to possible repeat abdominal surgery.  She has had some  right upper quadrant pain of late.  An ultrasound study raised the  question of a mass adherent to the gallbladder, although it sounded as  this could well be inflammation and/or a gallstone.  She is obviously  concerned in light of her history and is scheduled for evaluation by Dr.  Johnathan Hausen.   From a cardiac standpoint, she has been doing fairly well.  She has not  been able to keep up with regular exercise, but is quite active in her  daily life without difficulty.  She tries to walk quickly.  Bill, she  does experience some dyspnea, which resolves promptly with rest.  She  has had no chest pain.   Medications are unchanged from her last visit except for the addition of  low-dose Xanax, which has resulted in the resolution of the problems  that she came with last month regarding diaphoresis and anxiety at  night.  She did take metoprolol for a while, but when Diovan was added  to that by her endocrinologist, she became dizzy and stopped both  medications.   PHYSICAL EXAMINATION:  GENERAL:  Pleasant trim woman in no acute  distress.  VITAL SIGNS:  The weight is 123, stable, blood pressure 135/65, heart  rate 70 and regular, respirations 14.  NECK:  No jugular venous distention; transmitted murmur bilaterally.  LUNGS:  Clear.  CARDIAC:  Grade 3/6 systolic ejection murmur at the cardiac base.  ABDOMEN:  Soft and nontender; no organomegaly.  EXTREMITIES:  No edema.   Her most recent lipid profile is from November.  Triglycerides were 101,  HDL 48, and LDL 62.  Total cholesterol was 135.   IMPRESSION:  Ms. Mcdowell is doing well from a cardiovascular  standpoint.   There is no indication for preoperative stress testing.  Her aortic stenosis was mild when last assessed 1 year ago.  Repeat  testing is not necessary.  Her risk for cholecystectomy, if required, is  quite acceptable.  I will stay in touch with Dr. Hassell Done and plan to  reassess Ms. Pamer in the office in 6 months.    Sincerely,      Cristopher Estimable. Lattie Haw, MD, [facility]  Electronically Signed    RMR/MedQ  DD: 07/06/2008  DT: 07/07/2008  Job #: [id number]   CC:    Isabel Caprice. Hassell Done, MD

## 2010-12-08 NOTE — Assessment & Plan Note (Signed)
[facility] ON-CALL NOTE   NAME:Topping, Johnsie                      MRN:          [id number]  DATE:  02/05/2006                              DOB:      [birth year]    Ms. Dandy is a 60 year old woman who is followed by Dr. Lattie Haw.  She  has a history of coronary artery disease status post bypass surgery.  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  She states that earlier today she developed some mild chest pain which  resolved.  However, tonight she woke up from sleep with recurrent chest  pain.  She took three nitroglycerin with no significant relief.  She denies  any associated shortness of breath.  She said this is somewhat similar to  her previous angina but she had arm pain and shortness of breath before  which she does not have this time.   DISPOSITION:  I instructed her that her symptoms are concerning for angina  as she has not had relief with nitroglycerin and she needs to be evaluated  in the emergency room.  I suggest that she call 911; however, she felt that  her husband could bring her to the emergency room.  I cautioned her about  the risks of this and once again suggested she activate 911.                                Shaune Pascal. Bensimhon, MD    DRB/MedQ  DD:  02/05/2006  DT:  02/05/2006  Job #:  [id number]   cc:   Cristopher Estimable. Lattie Haw, MD, [facility]

## 2010-12-08 NOTE — Discharge Summary (Signed)
[place]  Patient:    [name] [name]                       MRN: [id number] Adm. Date:  [id number] Disc. Date: 07/11/99 Attending:  Alvie Heidelberg Dictator:   Ricki Miller, P.A.C. CC:         Gaye Pollack, M.D.             Cristopher Estimable. Lattie Haw, M.D. [facility]                           Discharge Summary  HISTORY OF THE PRESENT ILLNESS:  This is a 60 year old white female with a history of coronary artery disease, diagnosed since May 2000.  Since then, she has undergone multiple interventions to the right coronary artery.  In June 2000, she underwent catheterization and stent placement for a high-grade right coronary artery stenosis in the proximal portion.  Then, in July 2000, she underwent repeat catheterization with placement of a second stent.  Then, in September 2000, she  underwent repeat catheterization with rotablation of the right coronary artery ith placement of a third stent between the two prior stents.  Then, in October 2000, she underwent repeat catheterization in [facility] with rotablation of the right coronary artery, and intracoronary radiation to the right coronary  artery for restenosis.  She did well for approximately six to seven weeks before she began developing her typical anginal chest pain.  Since then, she has had crescendo symptoms.  At the time of admission, her pain was noted to be substernal, and developed with mild exertion, such as walking.  She also had angina with low degrees of stress, such as watching an exciting movie.  She described the pressure as varying from mild to severe, depending on type of activity she is doing, and is usually relieved with rest, but has occasionally required sublingual nitroglycerin use.  She was admitted this hospitalization for repeat catheterization, and further evaluation and treatment.  REVIEW OF SYMPTOMS:  Please see the history and physical  done at the time of admission.  PAST MEDICAL HISTORY:  Single-vessel coronary artery disease, as described above. Other diagnoses include a history of Hodgkins disease in 1981, treated with extensive radiation from her neck down to her pelvis.  She did not require surgery. She also has a history of a recently diagnosed diet-controlled diabetes mellitus, a history of markedly elevated triglyceride levels, and elevated cholesterol levels.  MEDICATIONS ON ADMISSION:  1. Multivitamin 4 p.o. q.d.  2. Fish oil omega-3 4 q.d.  3. Baycol 0.4 mg q.d.  4. ______ acid 4 q.d.  5. Antioxidant 1 b.i.d.  6. Plavix 75 q.d.  7. Aspirin 81 mg q.d.  8. Prilosec 20 mg q.d.  9. Atenolol 25 mg q.h.s. 10. Tricor 200 mg q.h.s. 11. Norvasc 5 mg q.h.s.  ALLERGIES:  Include CONTRAST DYE; intolerance to VERSED; intolerance to ERYTHROMYCIN; intolerance to PENICILLIN and CLEOCIN; history of intolerance to LIPITOR and ZOCOR, which elevated her liver function studies.  FAMILY HISTORY, SOCIAL HISTORY:  Please see the history and physical done at the time of admission.  HOSPITAL COURSE:  Patient was admitted electively for further evaluation and treatment, including cardiac catheterization, which was undertaken on June 24, 1999.  The study showed diffuse instent restenosis of the right coronary artery up to 70%.  This disease also extended further distally than on prior catheterizations, to  within about 1 cm of the takeoff of the posterior descending coronary artery.  The right coronary artery is a very large, dominant vessel. There was a question of a very mild ostial narrowing of the left main coronary artery, but this was felt to be possibly due to spasm from the catheter.  There  were no other lesions noted.  Left ventricular ejection fraction was 65%. There was also evidence of 2-3+ mitral regurgitation which, compared to previous study in June 2000, was significantly more, and at that time  there was only trivial mitral regurgitation.  There was also trace aortic insufficiency noted.  Patient underwent the catheterization, and a cardiac surgical consultation was obtained with Gilford Raid, M.D., who evaluated the patient and her studies. An impression included significant restenosis of the right coronary artery, status  post multiple interventional procedures.  Coronary artery bypass grafting was recommended.  Possible mitral valve repair was also entertained, but it was felt that a transesophageal echocardiogram would be required prior to that decision, as it was felt the mitral regurgitation may be ischemia-related.  The patient was elt to be a possible candidate for right internal mammary artery if long enough, or  possible radial artery, if the palmar arch was intact.  These decisions would be determined prior to and at the time of surgery.  PROCEDURE:  After the risks and benefits of the proposed procedure were explained to the patient and the proper consents were obtained, the patient underwent the  following procedure:  Coronary artery bypass grafting x 1.  The following grafts were placed:  Saphenous vein graft to the right coronary artery.  Cross clamp time was 13 minutes.  FINDINGS:  The patient was noted to have a right internal mammary artery that was too small and too short to use, even as a free graft, for the large caliber right coronary artery.  Additionally, palmar arches were incomplete and obliterate with radial compressions, so radial arteries were not felt to be a usable alternative. Therefore, the vein graft was used.  There was noted moderate adhesions secondary to the radiation therapy from her Hodgkins disease.  The transesophageal echocardiogram showed trivial mitral regurgitation, both pre and postoperatively. The aortic valve was noted to be tricuspid with small left coronary leaflet. There was only 1+ aortic  insufficiency.  POSTOPERATIVE HOSPITAL COURSE:  Patient has done quite well.  She has maintained stable hemodynamics, although she initially had a relatively low systolic blood   pressure and Lopressor was held.  Additionally, she did require early atrial pacing for her hemodynamic status, but this has shown a good and gradual improvement.  Routine Swan-Ganz arterial lines and chest tubes were discontinued in a routine  manner.  She was extubated without difficulty.  On postoperative day #1, her hemoglobin and hematocrit were 10 and 28, respectively.  She did show a drop in her levels to 8.3 and 23.2 on postoperative day #2.  She was attempted to be started on iron therapy, however, did not tolerate it for GI reasons.  Her incisions are healing well without signs of infection.  She has tolerated a gentle diuresis.  Oxygen has been weaned and she maintains good saturations on room air.  She is afebrile.  She has had some difficulties with nausea, but these appear to be resolving with routine treatments.  Currently, she is felt to be tentatively stable for discharge in the morning of July 11, 1999, pending morning round reevaluation.  MEDICATIONS ON DISCHARGE: 1. Enteric-coated aspirin  1 p.o. q.d. 2. Darvocet N-100 1-2 q.4-6h. p.r.n. 3. Tricor 200 mg q.h.s. 4. Baycol 0.4 mg q.h.s. 5. She is also to resume her estrogen patch, 0.05 mg patch every week.  FOLLOW-UP:  Follow-up will be arranged for the patient to see Dr. Lattie Haw in two weeks, Dr. Cyndia Bent in three weeks.  CONDITION ON DISCHARGE:  Stable, improving.  FINAL DIAGNOSIS:  Recurrent right coronary artery disease with unstable angina pectoris.  OTHER DIAGNOSES: 1. Multiple previous percutaneous interventions, right coronary artery. 2. Status post hysterectomy. 3. History of Hodgkins disease, 1981, status post extensive radiation. 4. History of newly diagnosed diet-controlled diabetes mellitus type 2. 5. History  of hypertriglyceridemia, hypercholesterolemia, and also postoperative    anemia. DD:  07/10/99 TD:  07/12/99 Job: [id number]

## 2010-12-08 NOTE — Consult Note (Signed)
NAME:  [name] [name], [name] [name]                       ACCOUNT NO.:  [id number]   MEDICAL RECORD NO.:  [id number]                   PATIENT TYPE:  INP   LOCATION:  A210                                 FACILITY:  [facility]   PHYSICIAN:  [name], M.D.               DATE OF BIRTH:  Sep 21, 1950   DATE OF CONSULTATION:  01/17/2004  DATE OF DISCHARGE:                                   CONSULTATION   REFERRING PHYSICIAN:  Dr. Orson Ape.   PRIMARY CARDIOLOGIST:  Dr. Lattie Haw.   HISTORY OF PRESENT ILLNESS:  This is a 60 year old woman with coronary  disease and prior CABG surgery as well as mild aortic stenosis who presents  with pulmonary edema.  [name]'s cardiac history dates to approximately  seven years ago when she first presented with angina.  She underwent  multiple percutaneous interventions, but experienced restenosis with each of  them.  She was referred for investigational brachytherapy, but this too  proved unsuccessful, eventually requiring single-vessel CABG surgery for her  RCA lesion.  She also has a bicuspid aortic valve and has had mild stenosis.  She has not had hypertension.  She has developed diabetes and has  hyperlipidemia.  She has done well in recent years with few symptoms.  A  treadmill stress test was negative two years ago.   Her current symptoms date to the last week or two when she noted pedal  edema.  On the day prior to admission, she developed cough and wheezing.  She had orthopnea and PND, eventually prompting her to present to the  emergency department, where chest x-ray demonstrated mild pulmonary edema.  Despite this, BNP level was normal.  She has had an echocardiogram that  demonstrates normal left ventricular systolic function.   [name] experienced some chest tightness with her dyspnea, but no real  chest discomfort.  There has been no lightheadedness nor syncope.  She has  not experienced palpitations.   PAST MEDICAL HISTORY:  1. Hodgkin's  disease, cured in 1981 with radiation therapy, including     mediastinal radiation.  2. She has had a hysterectomy for benign disease.  3. She has had an excisional breast biopsy for benign disease.  4. She presented with an abdominal mass on March 2004 that was felt to be     potentially recurrent neoplastic disease, but this proved benign in     laparotomy.   MEDICATIONS ON ADMISSION:  1. Actos 45 mg q.d.  2. Metformin 500 mg b.i.d.  3. Aspirin 325 mg q.d.   I last saw her approximately six months ago in the office.  At that time,  she was also taking fluvastatin and amlodipine plus TriCor and Coenzyme Q.   SOCIAL HISTORY:  She lives in [place] with her husband and two children; no  history of tobacco or significant alcohol use.  She has remained active and  followed a low-fat diet since cardiac surgery.  FAMILY HISTORY:  Both mother and father died in their 69s with coronary  disease.   REVIEW OF SYSTEMS:  Notable for need for corrective lenses, mild  degenerative joint disease of the right hip and occasional constipation.  All other systems are reviewed and are negative.   PHYSICAL EXAMINATION:  GENERAL:  Pleasant, youthful appearing woman.  VITAL SIGNS:  The heart rate is 100 and regular; respirations 20; blood  pressure 140/80; O2 saturation 99% on two liters.  HEENT:  Normal lids and conjunctivae; anicteric sclerae.  NECK:  Mild jugulovenous distension; normal carotid upstrokes without  bruits.  ENDOCRINE:  No thyromegaly.  LUNGS:  Clear.  CARDIAC:  Normal first and second heart sounds; grade 2/6 systolic ejection  murmur at the cardiac base, radiating towards the carotids.  ABDOMEN:  Soft and nontender; no organomegaly; normal bowel sounds.  EXTREMITIES:  Normal distal pulses; no edema.  NEUROMUSCULAR:  Symmetric strength and tone.  MUSCULOSKELETAL:  No joint deformities.   Chest x-ray:  Mild cardiomegaly; mild pulmonary edema.   EKG:  Sinus tachycardia; left  atrial abnormality; nondiagnostic inferior Q  wave; otherwise within normal limits.   OTHER LABORATORY:  Notable for normal CBC, normal chemistry profile and  negative cardiac markers.  BNP level and D-Dimer levels were normal.  Initial blood gas:  pH of 7.36, PCO2 of 39 and PO2 of 70 on two liters.   IMPRESSION:  [name] has developed pulmonary edema with a history of  mild aortic valvular disease and coronary artery disease.  The development  of ischemia or infarction is the first consideration.  Myocardial infarction  has been ruled out.  There were no echocardiographic abnormalities to  indicate the presence of ischemia.  Her aortic valve disease is of  insufficient severity to account for congestive heart failure.  She had no  tachycardia that might have caused tachycardia-induced congestive heart  failure.  Actos is a relatively new drug for her, but she tolerated Avandia  in the past.  These drugs certainly could contribute to fluid retention and  congestive heart failure.  This may be multifactorial with the issues as  described above.  She also could have a component of diastolic dysfunction  due to coronary disease and a history of chest radiation.   The entity of most concern is the recurrent coronary disease.  Consideration  was given to coronary angiography.  This was discussed with the patient.  We  elected to start with a stress Cardiolite study, which will be performed in  the morning.  She will be diuresed until that time.  We will adjust her  medications as appropriate based upon the results of these studies.  Thiazolidinedione drugs will be withheld for now, as will intravenous  fluids.   I appreciate the request for consultation and will be happy to follow this  nice woman with you in the hospital and subsequent to discharge.     ___________________________________________                                            [name], M.D.  RR/MEDQ  D:   01/17/2004  T:  01/17/2004  Job:  [id number]

## 2010-12-08 NOTE — Discharge Summary (Signed)
NAME:  [name] [name], [name] [name]                       ACCOUNT NO.:  [id number]   MEDICAL RECORD NO.:  [id number]                   PATIENT TYPE:  INP   LOCATION:  A210                                 FACILITY:  [facility]   PHYSICIAN:  Leonides Grills, M.D.            DATE OF BIRTH:  07/28/1950   DATE OF ADMISSION:  01/17/2004  DATE OF DISCHARGE:  01/19/2004                                 DISCHARGE SUMMARY   DISCHARGE DIAGNOSES:  1. Congestive heart failure.  2. Coronary artery disease status post coronary artery bypass graft.  3. Type 2 diabetes.  4. Remote history of Hodgkin's disease in 78.   HOSPITAL COURSE:  This 60 year old white female was admitted to the  emergency room after having extreme chest tightness, shortness of breath and  orthopnea during the night.  In the emergency room she was shown to have CHF  on chest x-ray as well as wet rales in her bases on exam with impressive  edema.  She was given IV Lasix and nitroglycerin drip initially with  significant improvement and diuresis.  Nitroglycerin drip was discontinued  before she went to the floor.  Her cardiac enzymes were negative serially  x3.  She was seen in consultation by Dr. Lattie Haw and underwent  echocardiogram which was basically normal with a normal ejection fraction.  She also underwent stress Cardiolite which showed no evidence of ischemia.  Her Actos was withheld during her stay with the feeling that it can cause  some fluid retention and she was continued on Lasix 40 mg daily with no  problems with her shortness of breath.  Patient was stable at time of  discharge, will be discharged home on her Tricor, her Lescol, 40 Lasix daily  along with 20 of K-Dur, she was not put on an ACE inhibitor because of  marginal blood pressures, patient's Actos was being discontinued, she will  be continued on metformin for diabetes, will be seen in my office in 2 weeks  and per cardiology also.  Stable at time of  discharge.     ___________________________________________                                         Leonides Grills, M.D.   WMM/MEDQ  D:  01/19/2004  T:  01/19/2004  Job:  [id number]

## 2010-12-08 NOTE — Discharge Summary (Signed)
   NAME:  [name] [name], [name] [name]                       ACCOUNT NO.:  [id number]   MEDICAL RECORD NO.:  [id number]                   PATIENT TYPE:  INP   LOCATION:  [place]                                 FACILITY:  [facility]   PHYSICIAN:  Isabel Caprice. Hassell Done, M.D.             DATE OF BIRTH:  08-08-1950   DATE OF ADMISSION:  10/07/2002  DATE OF DISCHARGE:  10/12/2002                                 DISCHARGE SUMMARY   ADMITTING DIAGNOSES:  Periduodenal masses with history of Hodgkin's disease  and radiation.   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   PROCEDURE:  Laparoscopy, laparotomy, and biopsy of peripancreatic duodenal  masses on October 07, 2002.   HOSPITAL COURSE:  The patient __________ admission and underwent the above  mentioned operation.  Postoperatively she did fine.  We kept her n.p.o. as  she had had a Kocher maneuver and some mobilization of her duodenum causing  her to have some gastric delayed emptying.  She progressed and was ready for  discharge on October 12, 2002.  She was given Darvocet-N 100 to take for pain.  Her pathology report did come back with a very favorable outcome.  Her  staples were removed from her incision.  She was given Darvocet-N 100 (20)  for pain.  She will be followed up in the office in three weeks.   CONDITION ON DISCHARGE:  Improved.                                               Isabel Caprice Hassell Done, M.D.    MBM/MEDQ  D:  10/12/2002  T:  10/12/2002  Job:  [id number]   cc:   Lennis P. Marko Plume, M.D.  [address] [address]  Fax: [phone]   Leonides Grills, M.D.  [address]  [place]  [place] [address]  Fax: [phone]   [name], M.D. [facility]   Docia Chuck. Henrene Pastor, M.D. [facility]

## 2010-12-08 NOTE — Op Note (Signed)
   NAME:  [name] [name], [name] [name]                       ACCOUNT NO.:  [id number]   MEDICAL RECORD NO.:  [id number]                   PATIENT TYPE:  OUT   LOCATION:  [facility]                                  FACILITY:  [facility]   PHYSICIAN:  Rodman Key B. Hassell Done, M.D.             DATE OF BIRTH:  [birth year]   DATE OF PROCEDURE:  05/05/2003  DATE OF DISCHARGE:  04/27/2003                                 OPERATIVE REPORT   PREOPERATIVE DIAGNOSIS:  Uncomfortable Prolene sutures in the midline.   POSTOPERATIVE DIAGNOSIS:  Uncomfortable Prolene sutures in the midline.   OPERATION PERFORMED:  Excision of four uncomfortable sutures.   SURGEON:  Isabel Caprice. Hassell Done, M.D.   DESCRIPTION OF PROCEDURE:  [name] [name] was taken to room 3 and given  some intravenous sedation.  The abdomen was prepped with Betadine and draped  sterilely.  I infiltrated the previously marked area where she could feel  the little knots from her 0 Prolene.  We infiltrated with lidocaine and cut  on and these four knots were identified, pulled out and removed.  This  removed any palpable mass from this area. There was essentially no bleeding  and the skin was then closed with 4-0 Vicryl with benzoin and Steri-Strips  on the skin.  The patient seemed to tolerate the procedure well and was  taken to the recovery room in satisfactory condition.                                                Isabel Caprice Hassell Done, M.D.    MBM/MEDQ  D:  05/05/2003  T:  05/05/2003  Job:  [id number]   cc:   Lennis P. Marko Plume, M.D.  [address] [address]  Fax: [phone]

## 2010-12-08 NOTE — Procedures (Signed)
NAME:  [name] [name], [name] [name] NO.:  [id number]   MEDICAL RECORD NO.:  [id number]                   PATIENT TYPE:  INP   LOCATION:  A210                                 FACILITY:  [facility]   PHYSICIAN:  [name], M.D.               DATE OF BIRTH:  Jan 07, 1951   DATE OF PROCEDURE:  01/17/2004  DATE OF DISCHARGE:                                  ECHOCARDIOGRAM   REFERRING PHYSICIAN:  Leonides Grills, M.D. and [name], M.D.   CLINICAL DATA:  A 60 year old woman with dyspnea and history of coronary  disease..   M-MODE:  Aorta 2.5, left atrium 4.1, septum 1.3, posterior wall, 1.0, LV  diastole 3.1, LV systole 2.2.   1. Technically adequate echocardiographic study.  2. Left atrial size at the upper limit of normal; normal right atrium and     right ventricle.  3. Moderate sclerosis of the aortic valve; mild to moderate impairment in     leaflet separation; mild stenosis and mild insufficiency by Doppler.  4. Normal aortic root diameter; mild to moderate calcification of the     proximal ascending aorta.  5. Normal mitral valve; mild to moderate annular calcification; trivial     regurgitation.  6. Normal tricuspid valve; mild regurgitation; normal estimated RV systolic     pressure.  7. Normal pulmonic valve.  8. Normal internal dimension fo the left ventricle; mild hypertrophy;     anteroseptal region is relatively hypokinetic compared to the other     myocardial segments, which are hyperdynamic.  Overall ejection fraction     is normal to supranormal.  9. Normal IVC.8.  Normal internal dimension, wall thickness, regional and     global function of the left ventricle.  10.      Comparison with prior study of August 12, 2003:  No significant     interval change.      ___________________________________________                                            [name], M.D.   RR/MEDQ  D:  01/17/2004  T:  01/17/2004  Job:  [id number]   cc:    Leonides Grills, M.D.  [address] [address]  Fax: [phone]

## 2010-12-08 NOTE — Consult Note (Signed)
[name]  Patient:    [name] [name]                       MRN: [id number] Proc. Date: 07/05/99 Adm. Date:  [id number] Attending:  Alvie Heidelberg CC:         Cristopher Estimable. Lattie Haw, M.D. [facility]                          Consultation Report  CLINICAL HISTORY:  This patient is a 60 year old white female with a history of  coronary artery disease diagnosed since May 2000.  Since then, she has undergone multiple interventions to the right coronary artery.  In June 2000, she underwent catheterization and stent placement for a high-grade right coronary artery stenosis in the proximal portion.  Then in July 2000, she underwent repeat catheterization with placement of a second stent.  Then in September 2000, she underwent repeat  catheterization with rotablation of the right coronary artery with placement of a third stent between the two prior stents.  Then in October 2000, she underwent repeat catheterization at [facility] with rotablation of the right coronary artery and intracoronary radiation to the right coronary artery for restenosis.  She did well for approximately six to seven weeks before she began  developing her typical anginal chest pain.  Since then, she has had crescendo symptoms.  At the present time, she has substernal chest pressure and pain developing with mild exertion such as walking.  She has also had angina with stress and even while watching an exciting movie.  She describes the pressure as varying from mild to severe depending on the type of activity she is doing.  It is usually relieved with rest but has required some sublingual nitroglycerin use.  She has  also had a moderate amount of shortness of breath at the same time.  She underwent repeat cardiac catheterization yesterday which showed diffuse in-stent restenosis of the right coronary artery up to 70%.  This disease also extended  further distally than on prior catheterizations to within about 1 cm of the takeoff of he posterior descending coronary artery.  The right coronary artery is a large, dominant vessel.  There is a question of very mild ostial narrowing of the left  main coronary artery, but this was felt to possibly be due to spasm from the catheter.  There were no other lesions seen in the LAD or left circumflex coronary arteries.  The left ventricular ejection fraction was 65%.  The catheterization  yesterday noted 2 to 3+ mitral regurgitation.  A review of her previous catheterization from June 2000 showed trivial mitral regurgitation.  There was lso a trace of aortic insufficiency noted.  REVIEW OF SYSTEMS:  1) Constitutional: She denies fever, chills, or night sweats. She has had a recent 5-pound weight loss but has been trying to lose weight. 2) ENT: She has had no visual changes.  She has had no problems with her nose or throat.  3) Cardiovascular: As above.  4) Respiratory: As above. She has had no  cough, sputum production, or upper respiratory symptoms.  5) GI: She has had some recent nausea.  She feels this may be related to her angina.  She has had normal appetite.  She has had normal bowel function. She denies melena or bright red blood per rectum.  6) GU: Negative.  7) Skin: Negative.  8) Musculoskeletal: She has  occasional left hip pain.  9) Neurologic: She has a remote history of syncope. She currently has no problem with this.  She has had no focal weakness or numbness.  10) Psychiatric: Negative.  11) Hematologic: She denies any history of bleeding  problems or easy bleeding.  12) Lymphatic: She has a history of Hodgkins lymphoma treated with radiation therapy in 1981.  She has had no recurrence.  13) Endocrine: She was recently diagnosed as having diet-controlled diabetes.  14) Allergy: She has a history of allergy to PENICILLIN and CLINDAMYCIN.   She developed  an upset stomach with ERYTHROMYCIN.  She developed elevated liver function tests with LIPITOR and ZOCOR.  She has a history of a reaction to CONTRAST DYE in 1981. There is also a question of intolerance to VERSED with some prolonged nausea.  PAST MEDICAL HISTORY:  As above, she has had multiple interventions to her right coronary artery.  She is status post hysterectomy in February 2000.  She has a history of Hodgkins disease in 63 treated with extensive radiation from her neck down to her pelvis.  She did not require any surgery.  She has a history of recently diagnosed diet-controlled diabetes mellitus.  She has a history of markedly elevated triglyceride levels and elevated cholesterol levels.  SOCIAL HISTORY:  Significant in that she is married and has two children.  She works as an Microbiologist at [employer].  She has no history of smoking.  She has used occasional ethanol in the past socially.  d  FAMILY HISTORY:  Strongly positive for coronary artery disease in multiple family members including both parents and a half sister.  This has occurred at a relatively young age in all of her family members.  PHYSICAL EXAMINATION:  VITAL SIGNS:  Blood pressure 105/60 with a pulse of 70 and respirations 16.  GENERAL:  She is a well-developed white female in no distress.  HEENT:  She is normocephalic, atraumatic.  Pupils are equal and reactive to light and accommodations.  The extraocular muscles are intact.  Sclerae are anicteric. Her throat is clear.  Her teeth are in good condition, and she has good oral hygiene.  NECK:  Normal carotid pulses bilaterally.  There is a left cervical bruit. There is no adenopathy or thyromegaly.  CARDIAC:  Regular rate and rhythm with a normal S1 and S2.  There is a grade 1 o 2/6 systolic murmur heard over the aorta as well as at the apex and up into the  axilla.  CHEST:  There is no chest wall  deformity.  LUNGS:  Clear.  ABDOMEN:  Active bowel sounds.  The abdomen is soft, flat, and nontender. There are no palpable masses and no organomegaly.  Liver and spleen are not palpably enlarged.   EXTREMITIES:  No peripheral edema.  Pedal pulses are palpable bilaterally. Radial pulses are equal bilaterally.  SKIN:  Exam shows skin to be warm and dry with no lesions.  NEUROLOGIC:  Exam shows her to be alert and oriented x 3.  Motor and sensory exams are normal.  Cranial nerves II-XII are intact.   She is at bed rest, and gait could not be determined.  IMPRESSION:  This 60 year old woman has significant restenosis of the right coronary artery status post multiple interventional procedures including stents, rotablations, and intracoronary radiation therapy.  She also has moderate mitral regurgitation noted by catheterization with a murmur on exam of mitral regurgitation.  I agree that coronary artery bypass graft  surgery for revascularization of the large, dominant right coronary artery is the best treatment for her.  The degree of mitral regurgitation is unclear and will require an echocardiogram to better quantitate this and to determine the structural integrity of the mitral valve.  Catheterization in June 2000 showed only trivial regurgitation.  I think the major complicating factor in her history is that of Hodgkins disease with extensive mediastinal radiation.  This may result in complete obliteration of pericardial cavity with adhesions and scar tissue which would greatly complicate the surgery.  This can also result in scarring around the right internal mammary artery and may make it very fragile so that it is not suitable for use in a bypass graft.  This is something that will need to be evaluated at the time of surgery. The amount of disease in the right coronary artery is fairly extensive now and extends out further distally.  It is possible that a pedicle  right internal right mammary graft may not be long enough to reach beyond the disease.  In that case, it may require using a free right mammary graft.  If the right mammary graft is not usable, then a radial artery graft may be able to be used.  She is right-handed, so hopefully the left radial artery would be suitable if the palmar arch is complete.  The degree of mitral regurgitation will need to be evaluated with preoperative echocardiogram and further with transesophageal echo in the operating room.  If  this requires repair, it could be performed at the same time with annuloplasty ring.  I discussed the operative procedure and possibilities with the patient including the possibility that she may have extensive mediastinal scarring due to the radiation.  I discussed the possibilities of using the right mammary artery or  radial artery graft.  I also discussed the possibility that we might have to use a vein graft if there is not an arterial graft that is suitable.  We discussed the alternatives, benefits, and risks in detail, and she understands and would like to proceed with surgery.  We will plan to obtain an echocardiogram tomorrow and then proceed with surgery on Friday. DD:  07/05/99 TD:  07/05/99 Job: [id number]

## 2010-12-08 NOTE — Discharge Summary (Signed)
NAME:  DEATRICE, SPANBAUER             ACCOUNT NO.:  [id number]   MEDICAL RECORD NO.:  [id number]          PATIENT TYPE:  INP   LOCATION:  4270                         FACILITY:  [facility]   PHYSICIAN:  Raynelle Bring, MD      DATE OF BIRTH:  April 15, 1951   DATE OF ADMISSION:  06/11/2007  DATE OF DISCHARGE:  06/14/2007                               DISCHARGE SUMMARY   ADMISSION DIAGNOSIS:  Right renal mass.   DISCHARGE DIAGNOSIS:  Renal cell carcinoma.   HISTORY AND PHYSICAL:  For full details, please see admission history  and physical.  Briefly, Ms. Shanker is a 60 year old female who was  incidentally found to have an enhancing right renal mass concerning for  renal malignancy.  She does have multiple medical comorbidities and did  undergo preoperative cardiac evaluation and was felt to be at fairly low  risk for complications from a radical nephrectomy.  A metastatic  evaluation was performed, which was negative, and after discussing  options with the patient, she did elect to proceed with a radical right  nephrectomy.  Due to the fact that she had undergone prior extensive  abdominal surgery, she was counseled that while an attempt would be made  to perform this laparoscopically, she was at high risk for necessitating  open surgical conversion.  In addition, due to the central location of  the renal tumor, she also understood that this could represent a  urothelial carcinoma, possibly requiring a nephroureterectomy.   HOSPITAL COURSE:  On June 11, 2007, the patient was taken to the  operating room and underwent a laparoscopic right radical nephrectomy.  While she did have intraabdominal adhesions, these did not preclude her  from undergoing a laparoscopic procedure.  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 but without evidence  for a urothelial malignancy.  Postoperatively, the  patient was  transferred to the step-down intensive care unit, where she was closely  monitored due to her cardiac history.  She remained hemodynamically  stable, although did have some decreased urine output the first evening  after surgery.  Her hematocrit remained stable at 27.  Her serum  creatinine did increase slightly and was 1.16.  This was rechecked the  following day and continued to a remained stable, around 1.2.  On  postoperative day #1, the patient's urine output was monitored, and it  did increase appropriately.  She was therefore able to be transferred to  a regular hospital room.  She was gradually able to have her diet  advanced, and she had return of bowel function.  In addition, she was  able to begin ambulating, which she did over the course of the first  couple of postoperative days.  Ms. Clonch also has diabetes which was  managed initially with an insulin drip.  She was subsequently  transitioned back to her insulin pump, which she does use at home.  In  addition, her oral hypoglycemic medications were gradually reinstituted  when she was back on a solid diet.  By postoperative day #3, she had  met  all discharge criteria and was able to be discharged home in excellent  condition.  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  This pathology result was  discussed with the patient during her hospitalization.   DISPOSITION:  Home.   DISCHARGE MEDICATIONS:  Ms. Treiber was instructed to resume all of her  regular home medications.  She was given a prescription to take Vicodin  as needed for pain.   DISCHARGE INSTRUCTIONS:  She was instructed to gradually increase her  ambulation over the course of the next few days, but was instructed to  refrain from any heavy lifting, strenuous activity, or driving.  She was  told to resume her  regular diabetic diet.   FOLLOWUP:  Ms. Mcmullan will follow up in 10-14 days for removal of her  staples and for further postoperative evaluation.      Raynelle Bring, MD  Electronically Signed     LB/MEDQ  D:  06/15/2007  T:  06/16/2007  Job:  [id number]   cc:   Cristopher Estimable. Lattie Haw, [address] [place], [place]  [place], [place] [address]   [place] Rosana Hoes, M.D.  Fax: [phone]   Juanda Bond. [place], M.D.  Fax: [phone]

## 2010-12-08 NOTE — Letter (Signed)
October 07, 2006    Leonides Grills, MD  [address], [place] [address]   RE:  NYLAH, BUTKUS  MRN:  [id number]  /  DOB:  1950/09/13   Dear Rush Landmark:   Mrs. Cloer returns to the office as scheduled for continued  assessment and treatment of coronary disease, aortic stenosis and  multiple cardiovascular risk factors.  She has done well in the six  months since I last saw her.  She recently developed an upper  respiratory tract infection consistent with influenza resulting in  fatigue and exercise intolerance.  She noted increased edema and some  tenderness over her right lower leg, but this has improved with  compression stockings.  She has lost some weight.  Diabetic control is  improved under the direction of Dr. Elyse Hsu.   CURRENT MEDICATIONS:  1. Glimepiride 4 mg a.m. and 2 mg q.p.m.  2. Amlodipine 2.5 mg b.i.d.  3. Furosemide 40 mg daily.  4. K-Ciel 10 mEq daily.  5. Simvastatin 20 mg daily.  6. Immediate Active insulin by subcutaneous pump.  7. Ezetimibe 10 mg daily.  8. Estradiol 0.075 mg transdermally q.24 h.  9. Calcium supplement.  10.Aspirin 325 mg daily.  11.Benefiber b.i.d.   PHYSICAL EXAMINATION:  GENERAL:  Trim, healthy-appearing young woman.  The weight is 120, unchanged from August.  Blood pressure 110/60, heart rate 90 and regular, respirations 14.  NECK:  No jugular venous distention.  Normal carotid upstrokes without  bruits.  LUNGS:  Clear.  CARDIAC:  Normal first heart sounds.  Decreased aortic component of the  second heart sound.  Grade 4-1/2 basilar systolic ejection murmur.  ABDOMEN:  Soft and nontender.  No organomegaly.  EXTREMITIES:  Trace edema.   IMPRESSION:  Mrs. Longfield is doing general well.  Her current  medication regimen appears quite good.  We will check the chemistry  profile and lipid profile in three months and see this nice woman back  shortly thereafter.  She will call if dyspnea does not improve or  worsens or if  she develops any additional worrisome symptoms.    Sincerely,      Cristopher Estimable. Lattie Haw, MD, [facility]  Electronically Signed    RMR/MedQ  DD: 10/07/2006  DT: 10/08/2006  Job #: [id number]   CC:    Juanda Bond. [place], M.D.

## 2010-12-08 NOTE — Discharge Summary (Signed)
NAME:  [name] [name], [name] [name]             ACCOUNT NO.:  [id number]   MEDICAL RECORD NO.:  [id number]          PATIENT TYPE:  INP   LOCATION:  [place]                         FACILITY:  [facility]   PHYSICIAN:  Gatha Mayer, MD,FACGDATE OF BIRTH:  03-09-1951   DATE OF ADMISSION:  05/20/2007  DATE OF DISCHARGE:  05/23/2007                               DISCHARGE SUMMARY   ADMISSION DIAGNOSES:  68. A 60 year old female with acute abdominal cramping, hematochezia      and leukocytosis.  Episode associated with syncope, suspect      segmental ischemic colitis.  2. Syncope likely vasovagal versus volume depletion.  3. Newly diagnosed renal cell carcinoma.  4. Insulin-dependent diabetes mellitus with insulin pump.  5. Coronary artery disease status post coronary artery bypass graft in      2000.  6. History of Hodgkin's disease status post radiation in 1981.  7. Hypertension.  8. History of congestive heart failure with constrictive pericarditis.  9. Hyperlipidemia.   DISCHARGE DIAGNOSES:  1. Resolving left-sided segmental ischemic colitis.  2. Hypotension.  3. Syncope likely vasovagal versus volume depletion.  4. Newly diagnosed renal cell carcinoma.  5. Insulin-dependent diabetes mellitus with insulin pump.  6. Coronary artery disease status post coronary artery bypass graft in      2000.  7. History of Hodgkin's disease status post radiation in 1981.  8. Hypertension.  9. History of congestive heart failure with constrictive pericarditis.  10.Hyperlipidemia.   CONSULTATIONS:  Internal medicine, Dr. Asa Lente.   BRIEF HISTORY:  Tanaisha is a pleasant 60 year old white female known to  Dr. Henrene Pastor, primary patient of Dr. Caron Presume in [place] and also known  to Dr. Rosana Hoes and Dr. Alinda Money of urology.  She has a complicated past  medical history as outlined above.  She had extensive radiation in 1981  due to Hodgkin's disease and was radiated from her neck to her  umbilicus.  She also has history of  coronary disease and is status post  bypass in 2000.  Has insulin-dependent diabetes and has an indwelling  pump.  She was recently diagnosed with a renal mass, felt to be a renal  cell carcinoma and is tentatively scheduled for surgery on June 06, 2007.   At this time, she had onset in the middle of the night the evening prior  to admission with severe abdominal cramping associated with diaphoresis  and nausea followed by dark foul-smelling stools.  She apparently did  have a syncopal episode at home while she was on the commode trying to  vomit.  The patient is certain that she passed out, but was not sure how  long she had lost consciousness for.  Her husband found her and was able  to get her back to bed.  Then about 7 a.m., she had another episode of  severe cramping followed by more bloody stools and they presented to the  emergency room.  In the ER, white count 16,000, hemoglobin 14.  She was  admitted for observation and supportive management with probable  segmental ischemic colitis.   LABORATORY DATA:  On May 20, 2007:  WBC  16.9, hemoglobin 14.2,  hematocrit of 41.9.  Electrolytes within normal limits.  Liver function  studies normal.  Serial values were obtained on May 22, 2007:  WBC  was 8.4, hemoglobin 10.4, hematocrit 30.8, platelets of 159.   DIAGNOSTICS:  X-ray studies none this admission.   HOSPITAL COURSE:  The patient was admitted to the service of Dr. Silvano Rusk who was covering the hospital.  She was rehydrated and placed on  a clear liquid diet.  Her pain and nausea were controlled.  She was  scheduled for a flexible sigmoidoscopy with Dr. [name] in the morning  of May 21, 2007.  She tolerated this exam without difficulty and was  found to have a left-sided segmental ischemic colitis.  Over the next  couple of days, her pain decreased and we were able to advance her diet.  She did not have any significant further active bleeding, but with   hydration, her hemoglobin fell to the 10 range.  As she was scheduled  for left nephrectomy with Dr. Alinda Money in approximately 2 weeks, we did  notify urology of her admission.  At that point, they planned on  proceeding at her scheduled date.  We also asked internal medicine to  see her to help with her diabetic management given an insulin pump.  By  May 23, 2007, she was improved, having some mild cramping, but not  requiring any pain medicine and able to eat without difficulty.  She was  allowed discharge to home with instructions to followup on June 05, 2007 at 1:30 with Dr. [name].  At that point in time, would plan to do  a full colonoscopy so that she may be cleared for a surgery.   DIET:  Low residue.   DISCHARGE MEDICATIONS:  1. Aspirin 81 mg daily.  2. Her Lasix was decreased to p.r.n. dosing.  3. Norvasc was to be held.  4. She was to continue her Neurontin t.i.d.  5. Tricor 150 q.a.m.  6. Lasix 40 q.a.m.  7. Potassium 10 mEq q.a.m.  8. Januvia 100 mg q.a.m.  9. Amaryl 4 mg q.a.m. and one-half tablet q.p.m.  10.Crestor 20 mg q.p.m.  11.Zetia 10 mg p.m.  12.Estradiol patch weekly.  13.Continue insulin pump as previous.   CONDITION ON DISCHARGE:  Stable and improved.      Amy Esterwood, PA-C      Gatha Mayer, MD,FACG  Electronically Signed    AE/MEDQ  D:  06/24/2007  T:  06/24/2007  Job:  [id number]   cc:   Gatha Mayer, MD,FACG  [facility]  [address] [place],  [address]

## 2010-12-08 NOTE — Procedures (Signed)
NAME:  [name] [name], ESTABROOK                       ACCOUNT NO.:  [id number]   MEDICAL RECORD NO.:  [id number]                   PATIENT TYPE:  OUT   LOCATION:  RAD                                  FACILITY:  [facility]   PHYSICIAN:  [name], M.D.               DATE OF BIRTH:  Mar 12, 1951   DATE OF PROCEDURE:  DATE OF DISCHARGE:                                  ECHOCARDIOGRAM   REFERRING PHYSICIAN:  Dr. Satira Anis. Orson Ape.  Dr. [name].   CLINICAL DATA:  A 60 year old woman with bicuspid aortic valve and previous  CABG surgery now presenting with dyspnea.   M-MODE:  1. Aorta 2.5.  2. Left atrium 3.8.  3. Septum 1.3.  4. Posterior wall 0.9.  5. LV diastole 2.9.  6. LV systole 2.3.   FINDINGS:  1. Technically-adequate echocardiographic study.  2. Normal left atrium, right atrium, and right ventricle.  3. Moderate sclerosis of the aortic valve leaflets with mild-to-moderate     impairment of maximal leaflet separation, very mild stenosis by Doppler;     mild insufficiency.  4. The diameter of the aortic root is normal; mild-to-moderate calcification     of the wall of the proximal ascending aorta is noted.  5. Normal tricuspid and pulmonic valves; trivial tricuspid regurgitation,     normal estimated RV systolic pressure.  6. Borderline mitral valve thickening; mild annular calcification; trivial     regurgitation.  7. Normal internal dimension of the left ventricle; borderline hypertrophy     of the septum; normal regional and global LV systolic function.  8. Normal IVC.  9. Comparison with prior study of January, 2004:  No significant interval     change.      ___________________________________________                                            [name], M.D.   RR/MEDQ  D:  08/12/2003  T:  08/12/2003  Job:  [id number]

## 2010-12-08 NOTE — Discharge Summary (Signed)
NAME:  HARLYNN, KIMBELL             ACCOUNT NO.:  [id number]   MEDICAL RECORD NO.:  [id number]          PATIENT TYPE:  INP   LOCATION:  8366                         FACILITY:  [facility]   PHYSICIAN:  Glori Bickers, M.D. LHCDATE OF BIRTH:  August 31, 1950   DATE OF ADMISSION:  02/05/2006  DATE OF DISCHARGE:  02/06/2006                                 DISCHARGE SUMMARY   PROCEDURES:  1.  Cardiac catheterization.  2.  Coronary arteriogram.  3.  Left ventriculogram.  4.  A 2-D echocardiogram.   DISCHARGE DIAGNOSES:  1.  Chest pain, secondary diagnosis-- Mild aortic stenosis with a mean      transaortic gradient of 17 mmHg and aortic valve area by VTI 0.45, valve      area by VMAX 0.52 centimeters squared.  2.  Mild MR by echocardiogram.  3.  Mild mitral annular calcification.  4.  Single vessel coronary artery disease with the RCA totaled and SVG to      PDA in December 2000.  5.  History of Hodgkin's disease with surgery and radiation  6.  History of Helicobacter pylori duodenal ulcers x2.  7.  History of bilateral Bartholin cyst removal.  8.  History of small intestine resection in 1998 secondary to lymphoma.  9.  History of a liver disease diagnosed by biopsy 1990 and 1999.  10. Status post total hysterectomy.  11. 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  12. History of syncope in January 2006 evaluated at [facility] in      [place].  13. Hyperlipidemia, total cholesterol 109, triglycerides 202, HDL 29, LDL 40      this admission.  76. Family history of coronary artery disease in both of her parents.  15. History of migraines.   TIME AT DISCHARGE:  42 minutes.   HOSPITAL COURSE:  Ms. Winburn is a 60 year old female with a history of  coronary artery disease.  She called the on-call physician last p.m.  secondary to chest discomfort and was advised to come to the emergency room.  She did so; and was admitted for further evaluation and  treatment.  Her  chest pain had both typical and atypical features.  Her last catheterization  was 2 years ago; and it was felt that cardiac catheterization was indicated  to further define her anatomy.  This was performed on 02/06/2006.   The cardiac catheterization showed no significant disease in the LAD and  circumflex systems with all a 20% stenosis in the circumflex.  The RCA was  totaled.  The SVG to RCA was patent and had a 40-50% stenosis in the ostium,  but this was not flow limiting.  She had normal right-sided pressures.  Dr.  Haroldine Laws evaluated the films; and felt that medical therapy was the best  option for coronary artery disease.  He also felt her symptoms might be GI  in origin, and recommended adding a proton pump inhibitor to her medication  regimen.   Ms. Salyers's bedrest is pending completion, but if her groin is stable  with ambulation.  She is tentatively considered stable for discharge on  02/06/2006 with outpatient follow-up arranged.   DISCHARGE INSTRUCTIONS:  1.  Her activities to be limited for the next 48 hours.  2.  She is to call our office for problems with the cath site.  3.  She is to stick to a low fat and salt diabetic diet.  4.  She is to follow up with Dr. Lattie Haw on August 20 at 3:15 with Dr.      Orson Ape as needed.   DISCHARGE MEDICATIONS:  1.  Aspirin 325 mg daily.  2.  Lasix 20 mg a day.  3.  __________  2 mg, 2 tablets, one tablet in the a.m. and one table in the      p.m.  4.  Norvasc 2.5 mg daily.  5.  Crestor 10 mg a day.  6.  Potassium 10 mEq daily.  7.  TriCor 145 mg daily.  8.  Mylanta 18 units q.h.s.  9.  Genuvia 100 mg a day.  10. Multivitamin, calcium, and Benefiber as prior to admission.  11. Nitroglycerin as needed.      Rosaria Ferries, P.A. [facility]      Glori Bickers, M.D. [facility]  Electronically Signed    RB/MEDQ  D:  02/06/2006  T:  02/06/2006  Job:  [id number]   cc:   Leonides Grills, M.D.

## 2010-12-08 NOTE — Cardiovascular Report (Signed)
NAME:  [name], [name] NO.:  [id number]   MEDICAL RECORD NO.:  [id number]           PATIENT TYPE:   LOCATION:                                 FACILITY:   PHYSICIAN:  Glori Bickers, M.D. [facility]  DATE OF BIRTH:   DATE OF PROCEDURE:  02/06/2006  DATE OF DISCHARGE:                              CARDIAC CATHETERIZATION   PRIMARY CARE PHYSICIAN:  Leonides Grills, M.D.   CARDIOLOGIST:  Scarlett Presto, M.D.   PATIENT IDENTIFICATION:  [name] [name] is a 60 year old woman with known  coronary artery disease status post multiple stents to the right coronary  complicated by restenosis who finally underwent bypass surgery with the  saphenous vein graft to the PDA.  She was doing well, until recently, when  she had recurrent chest pain.  She was admitted.  She ruled out for  myocardial infarction, with serial cardiac markers.  The EKG was nonacute.  She was referred for diagnostic catheterization.  She was also having some  dyspnea so a right heart cath was performed as well.   PROCEDURES PERFORMED:  1.  Right heart catheterization.  2.  Left heart catheterization.  3.  Left ventriculogram.  4.  Selective coronary angiography.  5.  Saphenous vein angiography.  6.  Angio-Seal closure device.   DESCRIPTION OF THE PROCEDURE:  The risks and benefits of catheterization  were explained.  Consent was signed and placed on the chart.  A 6-French  arterial sheath was placed in the right femoral artery using a modified  Seldinger technique under 1% local lidocaine.  A JL-3.5 was used to engage  the left main artery.  Of note, the left main did have an upward takeoff;  and was hard to get the catheter to fit well.  No torque catheter was used  for the right coronary artery as well as the saphenous vein graft.  Angled  pigtail was used for the left heart catheterization.  A 7-French venous  sheath was placed in the right femoral vein.  A standard Swan-Ganz catheter  was used for  the catheterization.  At the end of the procedure the patient's  right femoral arteriotomy site was closed using Angio-Seal closure device.  There was good hemostasis.  No apparent complications.   HEMODYNAMICS:  Central aortic pressure was 132/62 with a mean of 92.  LV  pressure 138/0 with an EDP of 13.  There was mild aortic stenosis on  pullback with a mean gradient of 14.  Right atrial pressure mean of 8, RV  pressure of 34/2, PA pressure 19/7 with a mean of 12.  Pulmonary capillary  wedge pressure mean of 15 with a V wave of about 25.  Fick cardiac output  was 2.2 liters per minute.  Fick cardiac index was 2.0 liters per minute per  meter squared.  PVR was 1.5 Woods units.   FINDINGS:  1.  Left main was normal.  2.  LAD was a small-to-moderate sized vessel.  It gave off 2 small diagonals      that were angiographically normal.  3.  Left circumflex was a moderate-sized  vessel; gave off a moderate-to-      large OM-1 and a large branching OM-2.  There was a minor irregularity      in the proximal portion of the OM-2.  4.  Right coronary artery was totally occluded ostially.  5.  Saphenous vein graft to the PDA was patent.  There was a 40-50% stenosis      in the ostium of the vein graft.  This was not flow-limiting.  The vein      graft back filled to show too small posterolaterals in the distal RCA.  6.  Left ventriculogram showed an EF of 60% with 1+ mitral regurgitation.   ASSESSMENT:  1.  One-vessel coronary artery disease with chronic total occlusion of the      right coronary artery.  2.  A 40-50% stenosis in the ostium of the saphenous vein graft to the PDA      which is non-flow limiting.  3.  Mild mitral regurgitation and mild aortic stenosis.  4.  Normal right-sided pressures.   PLAN/DISCUSSION:  Given her anatomy, it is unlikely that her chest pain is  cardiac in nature.  I will continue aggressive medical therapy.  We will  start a proton pump inhibitor for possible  reflux.  If the pain persists we  can consider a GI follow up.  Hopefully she will be able to go home, today,  if her groin remains stable.      Glori Bickers, M.D. [facility]  Electronically Signed     DB/MEDQ  D:  02/06/2006  T:  02/06/2006  Job:  [id number]   cc:   Leonides Grills, M.D.  Scarlett Presto, M.D.

## 2010-12-08 NOTE — Cardiovascular Report (Signed)
NAME:  [name] [name], [name] [name]                       ACCOUNT NO.:  [id number]   MEDICAL RECORD NO.:  [id number]                   PATIENT TYPE:  OIB   LOCATION:  2858                                 FACILITY:  [facility]   PHYSICIAN:  Ethelle Lyon, M.D. [facility]         DATE OF BIRTH:  1950/09/05   DATE OF PROCEDURE:  DATE OF DISCHARGE:                              CARDIAC CATHETERIZATION   PROCEDURE:  1. Left and right heart catheterization.  2. Left ventriculography, coronary angiography.  3. Saphenous vein graft angiography.   INDICATIONS:  [name] is a 60 year old lady status post single-vessel  coronary artery bypass grafting due to intractable restenosis of the  proximal RCA.  She now presents after an episode of heart failure with  episodic chest discomfort.  She is referred for diagnostic angiography.   PROCEDURAL TECHNIQUE:  Informed consent was obtained.  Under 1% lidocaine  local anesthesia, a 6-French sheath was placed in the right common femoral  artery, and an 8-French sheath in the right common femoral vein.  Right  heart catheterization was performed using balloon tipped catheter.  Cardiac  output was measured using the thermodilution method.  Coronary angiography  was then performed using JL-3.5 catheter for the native left and  multipurpose A1 catheter for the saphenous vein graft to the right.  The  native RCA was demonstrated to be totally occluded, and was not imaged.  Left-heart catheterization and ventriculography were then performed using a  pigtail catheter.  The patient tolerated the procedure well and was  transferred to the holding room in stable condition.  Sheaths will be  removed there.   COMPLICATIONS:  None.   FINDINGS:  1. Hemodynamics:  RA 12/12/9.  RV 28/5/6.  PA 25/12/19.  PCW 10/9/8.  Aorta     116/67/ 88.  LV 137/9/16.  Cardiac output/index 3.5/ 2.2.  2. No aortic stenosis on pullback.   VENTRICULOGRAPHY:  1. EF 60% without regional wall  motion abnormality.  2. No mitral regurgitation.   CORONARY ANGIOGRAPHY:  1. LEFT MAIN:  Angiographically normal.  2. LAD:  Relatively small vessel giving rise to a single diagonal.  It is     angiographically normal.  3. CIRCUMFLEX:  A fairly large vessel giving rise to three obtuse marginals.     There are only minor luminal irregularities in the vessel.  4. RCA:  Totally occluded proximally.  The saphenous vein graft to the PDA     is widely patent with excellent filling of the posterior left ventricular     branch as well.   IMPRESSION/RECOMMENDATIONS:  1. Normal left-heart filling pressures.  2. Widely patent saphenous vein graft to the right coronary artery with no     other significant coronary disease.  3. Recommend continued medical therapy.  Ethelle Lyon, M.D. [facility]    WED/MEDQ  D:  01/31/2004  T:  01/31/2004  Job:  [id number]   cc:   Leonides Grills, M.D.  [address]  [place] [address]  Fax: [phone]   [name], M.D.

## 2010-12-08 NOTE — Letter (Signed)
March 11, 2006     Leonides Grills, MD  [address], [place] [address]   RE:  TAWSHA, TERRERO  MRN:  [id number]  /  DOB:  May 29, 1951   Dear Rush [place]:   Ms. Benefiel returns to the office for continued assessment and treatment of  coronary artery disease and aortic valve disease.  Since I last saw her, she  developed a prolonged and severe episode of chest discomfort that reminded  her of admissions when she previously experienced right coronary artery  stenosis and then restenosis after percutaneous interventions.  Nonetheless,  cardiac catheterization revealed good hemodynamics, minimal aortic stenosis  and patency of the graft to her totally occluded right coronary artery.  There was a 40% to 50% graft stenosis, but this was not considered  hemodynamically significant.  There was absolutely no disease in the LAD,  circumflex and left main.  She has done well since discharge without much in  the way of recurrent symptoms.   Current medications are unchanged, except for the addition of Januvia 100 mg  daily and Aciphex 20 mg daily.   PHYSICAL EXAMINATION:  GENERAL:  A pleasant, trim woman in no acute  distress.  VITAL SIGNS:  The weight is 120, three pounds less than in March.  Blood  pressure 115/60, heart rate 85 and regular, respirations 16.  NECK:  No jugular venous distention; transmitted murmur bilaterally; normal  carotid upstrokes.  LUNGS:  Clear.  CARDIAC:  Normal first and second heart sounds; grade 3/6 basilar systolic  ejection murmur heard widely across the precordium.  ABDOMEN:  Soft and nontender; no masses; no organomegaly.  EXTREMITIES:  Normal distal pulses; no edema.   IMPRESSION:  Ms. Dutil continues to have patency of her single bypass  graft.  She also continues to have chest discomfort of uncertain etiology.  She may well have some mild myocardial ischemia, despite a patent graft.  The Aciphex does not appear to have been of benefit and can be  discontinued.  I will leave followup of metabolic profiles and lipids to Dr. Elyse Hsu,  since his practice does such a good job with this in their diabetics.  I  will plan to see this nice woman again in 7 months.    Sincerely,      Cristopher Estimable. Lattie Haw, MD, [facility]   RMR/MedQ  DD:  03/11/2006  DT:  03/12/2006  Job #:  [id number]   CC:    Juanda Bond. [place], MD

## 2010-12-08 NOTE — H&P (Signed)
NAME:  [name] [name], [name] [name]                       ACCOUNT NO.:  [id number]   MEDICAL RECORD NO.:  [id number]                   PATIENT TYPE:  INP   LOCATION:  A210                                 FACILITY:  [facility]   PHYSICIAN:  Leonides Grills, M.D.            DATE OF BIRTH:  04-08-1951   DATE OF ADMISSION:  01/17/2004  DATE OF DISCHARGE:                                HISTORY & PHYSICAL   CHIEF COMPLAINT:  Shortness of breath.   HISTORY OF PRESENT ILLNESS:  This is a 60 year old, white female with  multiple medical problems including status post CABG x5 years.  The patient  felt well this weekend working in the garden subsequently having some chest  tightness and shortness of breath.  The patient thought she was having an  allergic reaction and took Benadryl.  She had progressive orthopnea during  the night.  She presented to the emergency room in the wee hours of the  morning with CHF per exam and chest x-ray.  The patient's initial cardiac  enzymes were negative.  EKG just shows nonspecific changes.  Chest x-ray is  significant for cardiomegaly and pulmonary edema.  The patient denies chest  pain, although she does relate some chest tightness associated with the  shortness of breath.  She has had some occasional ankle edema of late.   ALLERGIES:  CLEOCIN, [place], [place], PENICILLIN and MYCINS.   CURRENT MEDICATIONS:  1. Actos 45 mg daily.  2. Metformin 500 mg b.i.d.  3. Tri-Chlor daily.  4. Aspirin 325 mg daily.   PAST MEDICAL HISTORY:  1. Coronary artery disease, status post CABG.  2. Type 2 diabetes.  3. Remote history of Hodgkin's disease in 1981, which has been in remission     since.  4. Status post hysterectomy.  5. Status post multiple benign cysts removed from her breast.   SOCIAL HISTORY:  She is a school principal.  Nonsmoker, nondrinker.   PHYSICAL EXAMINATION:  GENERAL:  Middle-age, white female who is mildly  dyspneic.  VITAL SIGNS:  Afebrile, blood  pressure 145/83, pulse 100 and regular,  respirations 20 and slightly labored.  O2 saturations are in the 90s.  HEENT:  TMs normal.  Pupils equal round and reactive to light and  accommodation.  Oropharynx benign.  NECK:  Supple without JVD, bruit or thyromegaly.  LUNGS:  Mild, moist rales in the bases.  HEART:  Regular sinus rhythm without murmurs, rubs or gallops.  ABDOMEN:  Soft, nontender.  EXTREMITIES:  Trace edema.  No clubbing or cyanosis.  NEUROLOGIC:  Grossly intact.   LABORATORY DATA AND X-RAY FINDINGS:  Cardiac enzymes are negative.  Blood  gasses with pH 7.36, pCO2 38, pO2 69.  O2 saturations 93%.  White blood  count 6400 with a hemoglobin of 12.1.  Electrolytes normal with a BUN and  creatinine 18 and 0.7.  D-dimer within normal range at 0.41.  BNP within  normal range at  72.5.   ASSESSMENT:  1. Congestive heart failure, new-onset.  2. History of coronary artery disease, status post coronary artery bypass     graft in 2003.  3. Type 2 diabetes.  4. Hyperlipidemia.  5. Remote history of Hodgkin's disease.     ___________________________________________                                         Leonides Grills, M.D.   WMM/MEDQ  D:  01/17/2004  T:  01/17/2004  Job:  [id number]

## 2010-12-08 NOTE — Procedures (Signed)
   NAME:  [name], [name] NO.:  [id number]   MEDICAL RECORD NO.:  [id number]                   PATIENT TYPE:  OUT   LOCATION:  RAD                                  FACILITY:  [facility]   PHYSICIAN:  Scarlett Presto, M.D.                DATE OF BIRTH:  08-18-1950   DATE OF PROCEDURE:  08/14/2002  DATE OF DISCHARGE:                                  ECHOCARDIOGRAM   REFERRING PHYSICIAN:  Bonne Dolores, M.D.   INDICATIONS FOR PROCEDURE:  This is a 60 year old female with a history of  bypass surgery in the year 2000, hypertension and angina.  No previous  studies are noted.  This was done as an outpatient.   M-MODE MEASUREMENTS:  The aorta measures 32 mm, which is normal.  The left atrium is 37 mm, which is normal.  The septum is 12 mm, which is mildly enlarged.  The posterior wall is 8 mm, which is normal.  The left ventricular diastolic dimension of 35 mm, which is normal.  The left ventricular systolic dimension of 28 mm, which is normal.  Left ventricular outflow tract is measured at 18 mm, which is normal size.   TWO-DIMENSIONAL AND DOPPLER IMAGING:  The left ventricle is normal size with  normal systolic function and estimated ejection fraction of 50-55% with no  obvious wall motion abnormalities.  There is no obvious left ventricular  hypertrophy, however, there appears to be upper septal hypertrophy.  The left ventricular transmitral Doppler reveals mild relaxation  abnormalities consistent with a grade 2.  The right ventricle and right atrium are both normal size with normal  systolic function and morphology.  The left atrium is borderline, enlarged most typically in the apical four-  chamber view.  The aortic valve is significantly calcified and appears to be bicuspid.  There is evidence of mild aortic stenosis with a peak gradient of 14 mmHg  and a mean gradient of 9 mmHg. The estimated aortic valve area by Doppler  calculation is 1.4 cm sq.   There is mild aortic insufficiency.  The mitral valve has mild annular calcification with mild mitral  regurgitation. No stenosis is seen.  There is mild tricuspid regurgitation with a morphologically normal valve.  The pulmonic valve is not well seen.  The inferior vena cava appears to be normal size.                                                 Scarlett Presto, M.D.    JH/MEDQ  D:  08/14/2002  T:  08/15/2002  Job:  [id number]

## 2010-12-08 NOTE — H&P (Signed)
NAME:  [name] [name], [name] [name] NO.:  [id number]   MEDICAL RECORD NO.:  [id number]           PATIENT TYPE:   LOCATION:                                 FACILITY:   PHYSICIAN:  Scarlett Presto, M.D.   DATE OF BIRTH:  09-13-1950   DATE OF ADMISSION:  02/05/2006  DATE OF DISCHARGE:                                HISTORY & PHYSICAL   BRIEF HISTORY:  [name] [name] is a 60 year old white female who is referred  to [facility] Emergency Room by Dr. Haroldine Laws secondary to chest discomfort.  She states that yesterday evening while driving home from work at  approximately 5:00 p.m. she developed anterior chest, dull aching sensation  associated with left arm weakness.  She denied any associated nausea,  vomiting, diaphoresis or shortness of breath.  After arriving home and  resting, she felt the discomfort lasted approximately 25 minutes and gave it  a 3 on a scale of 0/10.  She did not have any further problems that evening;  however, she awakened at approximately 3:00 a.m. and noticed similar  symptoms.  She felt it was slightly more intense and gave it a 4 on a scale  of 0/10.  She also noticed some shortness of breath, and the discomfort  slightly increased while walking to the recliner.  Over 30 minutes, she took  3 sublingual nitroglycerin.  After taking each sublingual nitroglycerin, she  would obtain seconds of relief, but her symptoms would return.  She spoke  with Dr. Haroldine Laws and was referred to the emergency room for further  evaluation.  She feels that these symptoms are somewhat similar to her prior  stenting in the year 2000.   She also notes last week she did not feel quite right, and she had forgotten  to take her medications one morning.  She noted some increased swelling in  her hands and feet and joint aching.  She stated after she resumed her  medications these symptoms had resolved by the weekend.  She also describes  some right jaw discomfort for which she saw  her dentist, and she was placed  on antibiotics with resolution of her symptoms.   ALLERGIES:  ALLERGIES INCLUDE CLEOCIN WHICH CAUSES ESOPHAGEAL SPASM,  PENICILLIN WHICH CAUSES RASH AND SWELLING, CODEINE RASH, ERYTHROMYCIN SEVERE  ABDOMINAL DISCOMFORT AND RASH, KEFLEX RASH, LIPITOR AND ZOCOR ELEVATED LFTS,  AVANDIA AND METFORMIN MAY HAVE CONTRIBUTED TO HER FIRST EPISODE OF CHF.   CURRENT MEDICATIONS:  1.  Aspirin 325 mg q.d.  2.  Lasix 20 mg q.d.  3.  Glimepiride 2 tablets in the morning, 1 in the p.m.  4.  Norvasc 2.5 q.d.  5.  Crestor unknown dosage q.d.  6.  KCl 10 mEq q.d.  7.  Tricor 145 q.d.  8.  Nitroglycerin p.r.n.  9.  Lantus 18 units q.h.s.  10. Multivitamin.  11. Benefiber b.i.d.  12. Calcium.  13. Geneva 40 mg q.d.   PAST MEDICAL HISTORY:  Her past medical history is notable for:  1.  Hodgkin's disease in 1981 with removal of neck tumor and radiation  therapy.  2.  Duodenal ulcer with H. Pylori x2 in 80s and 90s.  3.  Bilateral Bartholin cyst removed.  4.  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  5.  Fatty liver disease, liver biopsy in 99.  6.  Total hysterectomy.  7.  Stents placed to the RCA in May 2000, June 2000, September 2000 with      brachytherapy in October 2000.  In December 2000 underwent one-vessel      bypass surgery with saphenous vein graft to the PDA.  Last      catheterization was in January 31, 2004 which showed EF of 60%, total RCA      and patent saphenous vein graft to the RCA.  Last stress Myoview was on      January 18, 2004 and showed an EF of greater than 65%, no ischemia.  Last      echocardiogram on January 17, 2004 showed bicuspid aortic valve, anterior      septal hypokinesis, mild LVH, normal hyperdynamic EF, mild AS/AI,      moderate aortic sclerosis.  8.  CHF July 2005, possibly precipitated by medications, later diagnosed      with constrictive pericarditis.  9.  Syncope January 2006 evaluated at [facility] in  [place].  10. History of hyperlipidemia, last lipid check August 2006 showed total      cholesterol of 141, triglyceride 22, HDL 39, LDL 66.   SOCIAL HISTORY:  She resides in [place] with her husband.  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  She has one son, one  daughter, two grandchildren.  She denies tobacco, alcohol, drug or herbal  medication use.  She maintains low salt, sugar and fat diet.  She does not  exercise regularly.   FAMILY HISTORY:  Is notable for the death of her mother at the age of 27  with a myocardial infarction, history of tobacco use and COPD.  Her father  also died at 78 with a myocardial infarction, history of diabetes.  She has  one brother who has a history of hypertension, two half sisters which are  deceased, one at the age of 58 with metastatic lung cancer and history of  heart problems, another at the age of 49 with breast cancer.   REVIEW OF SYSTEMS:  Is notable for occasional migraine especially with  nitroglycerin, glasses, chronic dyspnea on exertion that has not changed  recently, nocturia, postmenopausal and constipation in addition to her  symptoms above.  She denies any recent fevers or chills.   PHYSICAL EXAMINATION:  GENERAL:  Well-nourished, well-developed pleasant  white female in no distress.  She is accompanied by her husband and  daughter.  VITAL SIGNS:  Temperature is 98, blood pressure initially was 159/74 - now  121/71, pulse 98, respirations 14, 98% on room air.  HEENT:  Is unremarkable.  NECK:  Supple without thyromegaly, adenopathy or JVD.  She has bilateral  carotid bruits.  CHEST:  Symmetrical excursion.  LUNGS:  Were clear to auscultation without rales, rhonchi or wheezing.  HEART:  PMI is not displaced.  Regular rate and rhythm.  She does have a 2/6  systolic murmur best appreciated at the left sternal border and apex.  SKIN:  Integument is intact. ABDOMEN:  Slightly rounded.  Bowel sounds  present without organomegaly,  masses or tenderness.  EXTREMITIES:  No cyanosis, clubbing or and edema.  Do not appreciate any  abdominal or femoral bruits.  All pulses are symmetrical and intact.  MUSCULOSKELETAL/NEUROLOGICAL:  Unremarkable.   LABORATORY DATA:  Chest x-ray shows no active disease.  EKG shows normal  sinus rhythm, first-degree AV block, early R-wave.  She does have some ST-  segment elevation in lead III with T-wave inversion; compared to an EKG that  is available on December 2000, her EKG today is remarkedly improved  considering that in 2000 she had diffuse ST-segment elevation.  PT is 12.6.  ER point care markers x1, CMET and CBC are pending.   IMPRESSION:  1.  Acute coronary syndrome with both typical and atypical features.  2.  Hypertension.  3.  Aortic stenosis, last echocardiogram in 2005.  Bilateral carotid bruits.      However, this is probably a radiated murmur from her aortic stenosis as      carotid Dopplers in September 28, 2005 showed bilateral early plaquing      without stenosis.   IMPRESSION:  Dr. Baird Cancer reviewed the patient's history, spoke with and  examined the patient and agrees with the above.  We will admit to [facility], continue her IV and heparin and nitroglycerin that was started in  the emergency room.  Cycle enzymes to rule out myocardial infarction.  Obtain an echocardiogram to reevaluate her history of aortic  stenosis/bicuspid aortic valve.  After discussing right and left heart  catheterization with Ms. Staup, she wishes to wait to discuss procedure  with Dr. Lattie Haw in the morning.  She has been tentatively scheduled for  right and left catheterization with possible PCI for February 06, 2006.      [name] [name], P.A. [facility]      Scarlett Presto, M.D.  Electronically Signed    EW/MEDQ  D:  02/05/2006  T:  02/05/2006  Job:  [id number]   cc:   Leonides Grills, M.D.  Fax: [phone]   [name], M.D. [facility]  [address]  [address]  [place]  [place] [address]   Juanda Bond. [place], M.D.  Fax: [phone]   Gilford Raid, M.D.  [address]  [place]  [place] [address]

## 2010-12-08 NOTE — Op Note (Signed)
[place]  Patient:    [name] [name]                       MRN: [id number] Proc. Date: 07/07/99 Adm. Date:  [id number] Attending:  Alvie Heidelberg CC:         Gaye Pollack, M.D.             Cristopher Estimable. Lattie Haw, M.D. 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  OPERATION:  Median sternotomy, extracorporeal circulation, coronary artery bypass graft surgery x 1 using a saphenous vein graft to the distal right coronary artery.  SURGEON:  Gaye Pollack, M.D.  ASSISTANT:  Earnstine Regal, P.A.C.  ANESTHESIA:  General endotracheal anesthesia.  INDICATION:  This patient is a 60 year old white female with a strong family history of heart disease, hyperlipidemia, as well as newly diagnosed diet-controlled diabetes, who has a history of coronary disease involving the right coronary artery.  This is first diagnosed in May of 2000.  She underwent a total of five percutaneous interventions over the last summer and fall including placement of two stents and two Rotablator procedures, the last one in October at [facility], at which time she had intracoronary radiation to try to prevent restenosis.  Unfortunately, after six weeks, she developed the same recurrent unstable anginal symptoms.  Repeat catheterization now shows diffuse end-stent restenosis of the  right coronary artery up to 70%.  There is also about 40% narrowing beyond the stents distally.  There was no significant disease in the left coronary circulation.  Left ventricular function appeared  normal.  The catheterization also showed 2 to 3+ mitral regurgitation.  Transthoracic echocardiogram showed 1 to 2+ mitral regurgitation with a structurally normal mitral valve.  It also showed possible bicuspid aortic valve with 1+ aortic insufficiency and no evidence of stenosis.  On exam, she had a variable murmur of mitral regurgitation which was present one day but not the next.  This was strongly suspicious for an ischemic etiology. After review of the catheterization and echocardiogram, I felt that coronary artery bypass graft surgery would be the best treatment.  She also had a history of extensive mediastinal radiation for Hodgkins lymphoma in 1981 and I was concerned that this may cause significant pericardial and mediastinal adhesions and may effect the mammary arteries.  The catheterization has shown that both mammary arteries were patent.  The preoperative carotid doppler exam showed no disease in the internal carotid  arteries.  It also showed that the palmar arches in both hands were incomplete nd obliterated with radial compression.  Therefore, I did not feel that the radial  artery could be used if the mammary arteries were not sufficient.  I discussed the operative procedure of coronary artery bypass graft surgery with possible use of the internal mammary artery or saphenous vein graft.  Also discussed examining the mitral valve with transesophageal echocardiogram in the  operating room and  repairing that valve if indicated.  I discussed the alternatives, benefits, and risks including bleeding, possible blood transfusion, infection, stroke, myocardial infarction, graft failure, and death with the patient.  She understood and agreed to proceed.  DESCRIPTION OF PROCEDURE:  The patient was taken to the operating room and placed on the table in the supine position.  After induction of general endotracheal anesthesia, a Foley catheter was placed in the bladder  using sterile technique.  Then the chest, abdomen, and both lower extremities were prepped and draped in he usual sterile manner.  A transesophageal echocardiogram was performed by anesthesiology with the heart  fully loaded and legs elevated.  There was trivial mitral regurgitation with a structurally normal mitral valve.  The aortic valve appeared to be a three leaflet valve with a small left coronary leaflet.  The leaflet moved normally and there was no evidence of stenosis.  There was 1+ central aortic regurgitation.  Left ventricular function appeared normal.  Then the chest was entered through a median sternotomy incision.  The pericardium opened in the midline.  Examination of the heart showed good ventricular contractility.  The ascending aorta had no palpable plaques in it.  Then the right internal mammary artery was harvested from the chest wall.  This  artery began as a medium caliber vessel but quickly tapered down to a small caliber artery at mid-sternal level.  After harvesting this artery and checking the blood flow in it, I did not feel that the flow would be sufficient to adequately supply the distal right coronary artery circulation.  Therefore the mammary artery was not used.  I harvested a segment of greater saphenous vein from the right lower leg and this vein was of medium caliber and of good quality.  Then the pericardial adhesions from the previous radiation therapy were divided  sharply.  The patient was then heparinized and when an adequate activated clotting time was achieved, the distal ascending aorta was cannulated using 6.5 mm aortic cannula for arterial inflow.  Venous outflow was achieved using two-stage venous cannula from the right atrial appendage and antegrade cardioplegia and vent cannula was inserted in the aortic root.  The patient was placed on cardiopulmonary bypass and the distal coronary artery was identified.  The left  coronary circulation had no palpable lesions in it.  The  right coronary artery was diffusely diseased in the proximal midportion.  This extended down to a point of 1.5 cm from the takeoff of the posterior descending  branch.  Just before the takeoff of the posterior descending branch, the artery  became soft and had no good disease in it.  Then the aorta was cross clamped and 500 cc of cold blood antegrade cardioplegia was administered into the aortic root with quick arrest of the heart.  Systemic  hypothermia had already occurred just from passive cooling and the patient was rewarmed to 37 degrees cGy while the distal anastomosis was performed.  Topical  hypothermia with ice saline was used.  A temperature probe was placed in the septum and insulating pad in the pericardium.  Then the distal anastomosis was performed to the distal right coronary artery. The internal diameter was about 2 mm.  The conduit used was the segment of greater saphenous vein and it was anastomosed in an end-to-side manner in a continuous -0 Prolene suture.  Flow was measured through the graft and was excellent.  Then the cross clamp was removed with a time of 13 minutes.  There was  spontaneous return of spontaneous fibrillation.  The patient was defibrillated into sinus rhythm.  A partial occlusion clamp was placed in the aortic root and the single  proximal vein graft anastomosis was performed in end-to-side manner using continuous 6-0 Prolene suture.  Clamp was removed.  Vein graft de-aired.  The proximal and distal anastomoses appeared hemostatic and lie of the graft was satisfactory.  Graft markers were placed around the proximal anastomoses.  Two temporary right  ventricular and right atrial pacing wires were placed and brought out through the skin.  When the patient had rewarmed to 37 degrees cGy, she was weaned from cardiopulmonary bypass on no inotropic agents.  Total bypass time was  68 minutes. The longer bypass time was necessary due to harvesting a piece of saphenous vein while on bypass.  Protamine was given and the venous and aortic cannula was removed without difficulty.  Hemostasis was achieved.  Three chest tubes were placed with a tube in the posterior pericardium, one in he right pleural space, and one in the anterior mediastinum.  The pericardium was reapproximated over the heart.  The sternum was closed with #6 stainless steel wire.  The fascia was closed with continuous #1 Vicryl suture.  Subcutaneous tissue was closed with continuous 2-0 Vicryl and the skin with 3-0 Vicryl subcuticular  closure.  Lower extremity vein harvest sites were closed in layers in a similar  manner.  The sponge, needle, and instrument counts were correct according to scrub nurse. Dry, sterile dressings were applied over the incisions and around the chest tubes which were hooked to Pleuravac suction.  The patient remained hemodynamically stable and was transported to the SICU in guarded but stable condition. DD:  07/08/99 TD:  07/08/99 Job: [id number]

## 2010-12-08 NOTE — Op Note (Signed)
NAME:  [name] [name], [name] [name]                       ACCOUNT NO.:  [id number]   MEDICAL RECORD NO.:  [id number]                   PATIENT TYPE:  INP   LOCATION:  X003                                 FACILITY:  [facility]   PHYSICIAN:  Isabel Caprice. Hassell Done, M.D.             DATE OF BIRTH:  Mar 30, 1951   DATE OF PROCEDURE:  10/07/2002  DATE OF DISCHARGE:                                 OPERATIVE REPORT   PREOPERATIVE DIAGNOSES:  Periduodenal masses on CT.   POSTOPERATIVE DIAGNOSES:  Fatty masses periduodenal hopeful sclerotic  lipomatous masses secondary to previous mantle radiation for Hodgkin's.   PROCEDURE:  Laparoscopy, laparotomy and biopsy peripancreatic/periduodenal  masses.   SURGEON:  Isabel Caprice. Hassell Done, M.D.   ASSISTANT:  Orson Ape. Rise Patience, M.D.   ANESTHESIA:  General endotracheal.   DESCRIPTION OF PROCEDURE:  [name] [name] was taken to room 1 at [facility]  on March 17 and given general anesthesia. The abdomen was prepped with  Betadine and draped sterilely. I first made a longitudinal incision below  her umbilicus and entered the abdomen with the Hasson cannula and  insufflated. I used the harmonic scalpel to take down numerous adhesions in  the upper abdomen and in the midline. A 5 mm port was placed on the right  side to accomplish this. When this was completed, I surveyed the upper  abdomen, looked beneath the liver, looked along the stomach and saw no gross  lymphadenopathy that would be easily biopsied with through the scope.   I then made an upper midline incision and I kocherized the duodenum. In  doing this in the area of the previously described mass that lay between the  duodenum and the gallbladder, I found a large retroperitoneal sclerotic  lipomatous mass which I dissected free and sent for permanent section. There  were other such lipomatous masses in the area overlying the right kidney. I  came across and mobilized this off the inferior vena cava. Proximally in  the  region of below the pylorus in the first portion of the duodenum, there was  another area that had a thickened appearance. Again mobilizing the bowel and  the head of the pancreas, I found behind again lipomatous masses. Anteriorly  there was a little area that could be sclerotic pancreatic material possibly  again relating to her radiation. I went in and excised this and sent this  for permanent section. Bleeding was controlled from that area with a figure-  of-eight suture of 2-0 silk. Cautery was also used. A piece of Surgicel was  applied.   I went ahead and broke scrub and went over and examined the films with  radiology and went over the operative findings with the radiographic  findings, and Dr. Owens Shark and I concurred that this area that had been removed  was the area that was seen on the CT scan, and would seem to be  demonstrating and  have biopsied  the area in question.   Sponge and needle counts were reported as correct. I irrigated everything  and had no evidence of bleeding. Adhesions had been taken down below the  umbilicus.   The fascia was closed with interrupted #0 Prolene pop-offs. The wounds were  irrigated with saline and closed with staples. Prior to closing the skin, I  did infiltrate the wounds with 0.5% Marcaine. The patient seemed to tolerate  the procedure well.                                               Isabel Caprice Hassell Done, M.D.    MBM/MEDQ  D:  10/07/2002  T:  10/07/2002  Job:  [id number]   cc:   Lennis P. Marko Plume, M.D.  [address] [address]  Fax: [phone]   Leonides Grills, M.D.  [address]  [place]  [place] [address]  Fax: [place] Lia Foyer, M.D. [facility]   John N. Henrene Pastor, M.D. [facility]

## 2010-12-15 DIAGNOSIS — I10 Essential (primary) hypertension: Secondary | ICD-10-CM | POA: Insufficient documentation

## 2010-12-15 DIAGNOSIS — I509 Heart failure, unspecified: Secondary | ICD-10-CM

## 2010-12-15 DIAGNOSIS — K56609 Unspecified intestinal obstruction, unspecified as to partial versus complete obstruction: Secondary | ICD-10-CM | POA: Insufficient documentation

## 2010-12-20 ENCOUNTER — Encounter: Payer: Self-pay | Admitting: Cardiology

## 2010-12-20 ENCOUNTER — Ambulatory Visit (INDEPENDENT_AMBULATORY_CARE_PROVIDER_SITE_OTHER): Payer: BC Managed Care – PPO | Admitting: Cardiology

## 2010-12-20 DIAGNOSIS — I509 Heart failure, unspecified: Secondary | ICD-10-CM

## 2010-12-20 DIAGNOSIS — C8190 Hodgkin lymphoma, unspecified, unspecified site: Secondary | ICD-10-CM

## 2010-12-20 DIAGNOSIS — C649 Malignant neoplasm of unspecified kidney, except renal pelvis: Secondary | ICD-10-CM | POA: Insufficient documentation

## 2010-12-20 DIAGNOSIS — E119 Type 2 diabetes mellitus without complications: Secondary | ICD-10-CM

## 2010-12-20 DIAGNOSIS — K269 Duodenal ulcer, unspecified as acute or chronic, without hemorrhage or perforation: Secondary | ICD-10-CM | POA: Insufficient documentation

## 2010-12-20 DIAGNOSIS — I359 Nonrheumatic aortic valve disorder, unspecified: Secondary | ICD-10-CM

## 2010-12-20 DIAGNOSIS — K219 Gastro-esophageal reflux disease without esophagitis: Secondary | ICD-10-CM | POA: Insufficient documentation

## 2010-12-20 DIAGNOSIS — I2510 Atherosclerotic heart disease of native coronary artery without angina pectoris: Secondary | ICD-10-CM | POA: Insufficient documentation

## 2010-12-20 DIAGNOSIS — D649 Anemia, unspecified: Secondary | ICD-10-CM

## 2010-12-20 DIAGNOSIS — E785 Hyperlipidemia, unspecified: Secondary | ICD-10-CM

## 2010-12-20 DIAGNOSIS — K7689 Other specified diseases of liver: Secondary | ICD-10-CM

## 2010-12-20 DIAGNOSIS — K559 Vascular disorder of intestine, unspecified: Secondary | ICD-10-CM

## 2010-12-20 DIAGNOSIS — M899 Disorder of bone, unspecified: Secondary | ICD-10-CM

## 2010-12-20 DIAGNOSIS — K7581 Nonalcoholic steatohepatitis (NASH): Secondary | ICD-10-CM | POA: Insufficient documentation

## 2010-12-20 DIAGNOSIS — R0989 Other specified symptoms and signs involving the circulatory and respiratory systems: Secondary | ICD-10-CM

## 2010-12-20 DIAGNOSIS — M8580 Other specified disorders of bone density and structure, unspecified site: Secondary | ICD-10-CM | POA: Insufficient documentation

## 2010-12-20 DIAGNOSIS — I350 Nonrheumatic aortic (valve) stenosis: Secondary | ICD-10-CM | POA: Insufficient documentation

## 2010-12-20 DIAGNOSIS — K56609 Unspecified intestinal obstruction, unspecified as to partial versus complete obstruction: Secondary | ICD-10-CM

## 2010-12-20 DIAGNOSIS — I10 Essential (primary) hypertension: Secondary | ICD-10-CM

## 2010-12-20 MED ORDER — PRAVASTATIN SODIUM 10 MG PO TABS: 10.0000 mg | ORAL_TABLET | Freq: Every evening | ORAL | Status: DC

## 2010-12-20 NOTE — Assessment & Plan Note (Signed)
Myalgias resolved when rosuvastatin was discontinued and recurred with a second exposure to that medication.  Pravastatin 10 mg q.d. Will be substituted.  Patient will call for any recurrent myalgias.

## 2010-12-20 NOTE — Patient Instructions (Addendum)
Your physician recommends that you schedule a follow-up appointment in:4 months Your physician recommends that you return for lab work in: today and 1 month Your physician has recommended you make the following change in your medication: pravastatin 61m daily, call for myalgias, lasix take an extra 40-846mfor wt increase 2 lbs until wt back to baseline. Endocrinology referral- Dr. RoLattie Hawo call pt directly

## 2010-12-20 NOTE — Assessment & Plan Note (Signed)
Patient continues to be very volume sensitive.  Furosemide 40-80 mg q.d will be taken for a weight gain of 2 pounds as an extra daily dose until weight returns to baseline.  Electrolytes and renal function will be monitored with serial chemistry profiles.

## 2010-12-20 NOTE — Progress Notes (Signed)
HPI : [name] [name] returns to the office for continued assessment and treatment of chronic and acute congestive heart failure with preserved left ventricular systolic function.  Since her last visit, she has done fairly well.  She experienced one episode of a 3 pound weight gain associated with an episode of PND that responded to a single extra dose of furosemide.  Otherwise, she generally gets through her day without any cardiopulmonary symptoms whatsoever.  She has not had significant chest discomfort.  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  The possibility of patient-prosthetic valve mismatch has been considered.  A postoperative echocardiogram was reviewed.  This demonstrated only a modest gradient across the aortic valve.   Current Outpatient Prescriptions on File Prior to Visit  Medication Sig Dispense Refill  . ALPRAZolam (XANAX) 0.5 MG tablet Take 0.5 mg by mouth at bedtime as needed.        Marland Kitchen aspirin 81 MG tablet Take 81 mg by mouth daily.        . calcium carbonate (TUMS - DOSED IN MG ELEMENTAL CALCIUM) 500 MG chewable tablet Chew 1 tablet by mouth daily.        . Calcium Carbonate-Vitamin D (RA CALCIUM PLUS VITAMIN D) 600-400 MG-UNIT per tablet Take 1 tablet by mouth daily.        . cholecalciferol (VITAMIN D) 1000 UNITS tablet Take 1,000 Units by mouth daily.        . Coenzyme Q10 (COQ10) 100 MG CAPS Take 1 capsule by mouth daily.        Marland Kitchen docusate sodium (COLACE) 100 MG capsule Take 100 mg by mouth 2 (two) times daily.        Marland Kitchen ezetimibe (ZETIA) 10 MG tablet Take 10 mg by mouth daily.        . fenofibrate (TRICOR) 145 MG tablet Take 145 mg by mouth daily.        . furosemide (LASIX) 40 MG tablet Take 40 mg by mouth as needed.        . furosemide (LASIX) 80 MG tablet Take 80 mg by mouth daily. Take an extra 40-68m for wt increase 2lbs until wt back to baseline      . insulin glulisine (APIDRA) 100 UNIT/ML injection Inject 106 Units into the skin daily.       .  metoprolol tartrate (LOPRESSOR) 25 MG tablet Take 25 mg by mouth 2 (two) times daily.        . Multiple Vitamins-Minerals (MULTIVITAMIN WITH MINERALS) tablet Take 1 tablet by mouth daily.        . nitroGLYCERIN (NITROLINGUAL) 0.4 MG/SPRAY spray Place 1 spray under the tongue every 5 (five) minutes as needed.        . OMEGA-3 KRILL OIL 300 MG CAPS Take 1 capsule by mouth daily.        .Marland Kitchenomeprazole (PRILOSEC) 20 MG capsule Take 20 mg by mouth daily.        . potassium chloride SA (K-DUR,KLOR-CON) 20 MEQ tablet Take 20 mEq by mouth as needed.       . sitaGLIPtan (JANUVIA) 100 MG tablet Take 100 mg by mouth daily.        . vitamin C (ASCORBIC ACID) 500 MG tablet Take 500 mg by mouth daily.        . pravastatin (PRAVACHOL) 10 MG tablet Take 1 tablet (10 mg total) by mouth every evening.  30 tablet  6  . DISCONTD: acyclovir (ZOVIRAX) 5 % ointment Apply  1 application topically every 3 (three) hours.        Marland Kitchen DISCONTD: rosuvastatin (CRESTOR) 10 MG tablet Take 10 mg by mouth daily.        Marland Kitchen DISCONTD: Wheat Dextrin (BENEFIBER PO) Take 1 tablet by mouth daily.           Allergies  Allergen Reactions  . Atorvastatin   . Avandia (Rosiglitazone Maleate)   . Cephalexin   . Clindamycin/Lincomycin   . Codeine   . Erythromycin   . Metformin   . Penicillins   . Simvastatin       Past medical history, social history, and family history reviewed and updated.  ROS: General: no anorexia, weight gain or weight loss Cardiac: no chest pain, dyspnea, orthopnea, PND,  or syncope Respiratory: no cough, sputum production or hemoptysis GI: no nausea, abdominal pain, emesis, diarrhea or constipation Integument: no significant lesions Neurologic: No muscle weakness or paralysis; no speech disturbance; no headache  PHYSICAL EXAM: BP 116/66  Pulse 83  Ht 5' 3"  (1.6 m)  Wt 126 lb (57.153 kg)  BMI 22.32 kg/m2  SpO2 97%  General-Well developed; no acute distress Body habitus-proportionate weight and  height Neck-No JVD; transmitted murmur to the carotids bilaterally Lungs-clear lung fields; resonant to percussion Cardiovascular-normal PMI; normal S1 and S2; grade 3/6 systolic ejection murmur at the cardiac base Abdomen-normal bowel sounds; soft and non-tender without masses or organomegaly Musculoskeletal-No deformities, no cyanosis or clubbing Neurologic-Normal cranial nerves; symmetric strength and tone Skin-Warm, no significant lesions Extremities-distal pulses intact; trace ankle edema  ASSESSMENT AND PLAN:

## 2010-12-20 NOTE — Assessment & Plan Note (Addendum)
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  This can be reevaluated when an echocardiogram is required in the future.

## 2010-12-20 NOTE — Assessment & Plan Note (Signed)
Blood pressure control is quite good; current medications will be continued.

## 2010-12-20 NOTE — Assessment & Plan Note (Signed)
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

## 2010-12-20 NOTE — Assessment & Plan Note (Signed)
Minimal anemia; repeat CBC will be obtained.  Colonoscopy was performed a few years ago and was negative.  If anemia persists, further evaluation will be warranted.

## 2010-12-21 ENCOUNTER — Telehealth: Payer: Self-pay | Admitting: Cardiology

## 2010-12-21 LAB — BASIC METABOLIC PANEL
CO2: 30 mEq/L (ref 19–32)
Calcium: 10.4 mg/dL (ref 8.4–10.5)
Potassium: 4 mEq/L (ref 3.5–5.3)
Sodium: 140 mEq/L (ref 135–145)

## 2010-12-21 NOTE — Telephone Encounter (Signed)
Patient would like to know if it is okay for her to fly to [place]. Wants to know if she should fly straight through or break the trip up. Please advise... [name] [name]

## 2010-12-22 NOTE — Telephone Encounter (Signed)
Only if she takes me to provide medical care. Long distance travel by plane should not be a problem. She needs to get up every hour or 2 to walk the length of a plane a few times. Keep medication with her-do not check in baggage She is likely to connect through a [employer], but can fly nonstop if that is an option She can call during the trip if any issues arise.

## 2010-12-22 NOTE — Telephone Encounter (Signed)
Left detailed message on pt's cell

## 2010-12-25 ENCOUNTER — Encounter: Payer: Self-pay | Admitting: *Deleted

## 2011-01-03 ENCOUNTER — Other Ambulatory Visit: Payer: Self-pay | Admitting: Cardiology

## 2011-01-04 ENCOUNTER — Other Ambulatory Visit: Payer: Self-pay | Admitting: Oncology

## 2011-01-04 ENCOUNTER — Encounter (HOSPITAL_BASED_OUTPATIENT_CLINIC_OR_DEPARTMENT_OTHER): Payer: BC Managed Care – PPO | Admitting: Oncology

## 2011-01-04 DIAGNOSIS — C8119 Nodular sclerosis classical Hodgkin lymphoma, extranodal and solid organ sites: Secondary | ICD-10-CM

## 2011-01-04 DIAGNOSIS — C649 Malignant neoplasm of unspecified kidney, except renal pelvis: Secondary | ICD-10-CM

## 2011-01-04 LAB — IRON AND TIBC: Iron: 116 ug/dL (ref 42–145)

## 2011-01-04 LAB — COMPREHENSIVE METABOLIC PANEL
ALT: 32 U/L (ref 0–35)
Albumin: 4.3 g/dL (ref 3.5–5.2)
CO2: 31 mEq/L (ref 19–32)
Calcium: 11 mg/dL — ABNORMAL HIGH (ref 8.4–10.5)
Chloride: 98 mEq/L (ref 96–112)
Potassium: 3.9 mEq/L (ref 3.5–5.3)
Sodium: 141 mEq/L (ref 135–145)
Total Protein: 8.3 g/dL (ref 6.0–8.3)

## 2011-01-04 LAB — CBC WITH DIFFERENTIAL/PLATELET
BASO%: 0.2 % (ref 0.0–2.0)
HCT: 38.5 % (ref 34.8–46.6)
MCHC: 33.4 g/dL (ref 31.5–36.0)
MONO#: 0.4 10*3/uL (ref 0.1–0.9)
NEUT%: 74.3 % (ref 38.4–76.8)
RBC: 4.38 10*6/uL (ref 3.70–5.45)
RDW: 14.9 % — ABNORMAL HIGH (ref 11.2–14.5)
WBC: 8.6 10*3/uL (ref 3.9–10.3)
lymph#: 1.6 10*3/uL (ref 0.9–3.3)

## 2011-01-04 LAB — TSH: TSH: 1.317 u[IU]/mL (ref 0.350–4.500)

## 2011-01-12 ENCOUNTER — Other Ambulatory Visit: Payer: Self-pay | Admitting: Oncology

## 2011-01-12 ENCOUNTER — Encounter (HOSPITAL_BASED_OUTPATIENT_CLINIC_OR_DEPARTMENT_OTHER): Payer: BC Managed Care – PPO | Admitting: Oncology

## 2011-01-12 DIAGNOSIS — Z1231 Encounter for screening mammogram for malignant neoplasm of breast: Secondary | ICD-10-CM

## 2011-01-12 DIAGNOSIS — Z85528 Personal history of other malignant neoplasm of kidney: Secondary | ICD-10-CM

## 2011-01-12 DIAGNOSIS — Z87898 Personal history of other specified conditions: Secondary | ICD-10-CM

## 2011-01-15 ENCOUNTER — Other Ambulatory Visit: Payer: Self-pay | Admitting: *Deleted

## 2011-01-15 MED ORDER — PRAVASTATIN SODIUM 10 MG PO TABS: 10.0000 mg | ORAL_TABLET | Freq: Every day | ORAL | Status: DC

## 2011-01-30 ENCOUNTER — Other Ambulatory Visit: Payer: Self-pay | Admitting: Cardiology

## 2011-02-03 ENCOUNTER — Other Ambulatory Visit: Payer: Self-pay | Admitting: Cardiology

## 2011-03-12 ENCOUNTER — Ambulatory Visit
Admission: RE | Admit: 2011-03-12 | Discharge: 2011-03-12 | Disposition: A | Discharge status: Home / Self Care | Payer: BC Managed Care – PPO | Source: Ambulatory Visit | Attending: Oncology | Admitting: Oncology

## 2011-03-12 DIAGNOSIS — Z1231 Encounter for screening mammogram for malignant neoplasm of breast: Secondary | ICD-10-CM

## 2011-03-15 ENCOUNTER — Telehealth: Payer: Self-pay | Admitting: Cardiology

## 2011-03-15 NOTE — Telephone Encounter (Signed)
Patient was on Pravastatin but was having bad leg cramps / was told by Pharmacist to have it changed because of interaction / would like Dr.Rothbart to handle this / tg

## 2011-03-16 NOTE — Telephone Encounter (Signed)
Pt having severe leg cramps Pt is taking pravastatin and tricor She would like to change medications to stop symptoms Last lipid profile on 03/22/10      127                                                  151                                                   36                                                    61

## 2011-03-27 NOTE — Telephone Encounter (Signed)
Called pt and lmom for pt to stop Pravastatin for one month per Dr Lattie Haw and see if this helps with leg cramping  She is to call the office and let us know if she stops the medication

## 2011-03-27 NOTE — Telephone Encounter (Signed)
I am not aware of any interaction between pravastatin, especially at the low dose that she is taking, and any other of her medications.  She can hold pravastatin for one month to determine if this is causing her symptoms.   She should call us back and let us know the outcome of holding this medication.

## 2011-04-13 ENCOUNTER — Other Ambulatory Visit: Payer: Self-pay | Admitting: Dermatology

## 2011-04-17 ENCOUNTER — Encounter: Payer: Self-pay | Admitting: Cardiology

## 2011-04-18 ENCOUNTER — Ambulatory Visit (INDEPENDENT_AMBULATORY_CARE_PROVIDER_SITE_OTHER): Payer: BC Managed Care – PPO | Admitting: Cardiology

## 2011-04-18 ENCOUNTER — Ambulatory Visit: Payer: BC Managed Care – PPO | Admitting: Cardiology

## 2011-04-18 ENCOUNTER — Encounter: Payer: Self-pay | Admitting: Cardiology

## 2011-04-18 VITALS — BP 119/72 | HR 71 | Resp 18 | Ht 63.0 in | Wt 128.4 lb

## 2011-04-18 DIAGNOSIS — I10 Essential (primary) hypertension: Secondary | ICD-10-CM

## 2011-04-18 DIAGNOSIS — I509 Heart failure, unspecified: Secondary | ICD-10-CM

## 2011-04-18 DIAGNOSIS — E782 Mixed hyperlipidemia: Secondary | ICD-10-CM

## 2011-04-18 DIAGNOSIS — E785 Hyperlipidemia, unspecified: Secondary | ICD-10-CM

## 2011-04-18 NOTE — Assessment & Plan Note (Signed)
Blood pressure control is good with current medications, which will be continued.

## 2011-04-18 NOTE — Progress Notes (Signed)
HPI : [name] returns to the office as scheduled for continued assessment and treatment of coronary disease, valvular heart disease, conduction system disease and CHF with preserved LV systolic function.  Since her last visit, [name] has done extremely well.  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  She has not been exercising entirely regularly, but walks without difficulty on a treadmill and uses an elliptical machine.  After stopping rosuvastatin, muscle spasm and pain essentially resolved.  She restarted therapy with pravastatin 10 mg q.d and noted mild symptoms, which progressed with higher doses of this drug.  Having stopped pravastatin, she once again feels better than she has for years.  Current Outpatient Prescriptions on File Prior to Visit  Medication Sig Dispense Refill  . ALPRAZolam (XANAX) 0.5 MG tablet Take 0.5 mg by mouth at bedtime as needed.        Marland Kitchen aspirin 81 MG tablet Take 81 mg by mouth daily.        . calcium carbonate (TUMS - DOSED IN MG ELEMENTAL CALCIUM) 500 MG chewable tablet Chew 1 tablet by mouth daily.        . Calcium Carbonate-Vitamin D (RA CALCIUM PLUS VITAMIN D) 600-400 MG-UNIT per tablet Take 1 tablet by mouth daily.        . cholecalciferol (VITAMIN D) 1000 UNITS tablet Take 1,000 Units by mouth daily.        . Coenzyme Q10 (COQ10) 100 MG CAPS Take 1 capsule by mouth daily.        Marland Kitchen docusate sodium (COLACE) 100 MG capsule Take 100 mg by mouth 2 (two) times daily.        . furosemide (LASIX) 40 MG tablet Take 40 mg by mouth as needed.        . furosemide (LASIX) 80 MG tablet TAKE ONE (1) TABLET BY MOUTH EVERY      DAY  90 tablet  1  . insulin glulisine (APIDRA) 100 UNIT/ML injection Inject 106 Units into the skin daily.       Marland Kitchen KLOR-CON M20 20 MEQ tablet TAKE TWO TABLETS BY MOUTH DAILY  180 tablet  1  . metoprolol tartrate (LOPRESSOR) 25 MG tablet TAKE ONE TABLET TWICE DAILY  180 tablet  1  .  Multiple Vitamins-Minerals (MULTIVITAMIN WITH MINERALS) tablet Take 1 tablet by mouth daily.        . nitroGLYCERIN (NITROLINGUAL) 0.4 MG/SPRAY spray Place 1 spray under the tongue every 5 (five) minutes as needed.        . OMEGA-3 KRILL OIL 300 MG CAPS Take 1 capsule by mouth daily.        Marland Kitchen omeprazole (PRILOSEC) 20 MG capsule Take 20 mg by mouth daily.        . sitaGLIPtan (JANUVIA) 100 MG tablet Take 100 mg by mouth daily.        . TRICOR 145 MG tablet TAKE ONE (1) TABLET BY MOUTH EVERY      DAY  90 tablet  1  . ZETIA 10 MG tablet TAKE ONE TABLET DAILY AT BEDTIME  90 tablet  3  . pravastatin (PRAVACHOL) 10 MG tablet Take 1 tablet (10 mg total) by mouth daily.  90 tablet  3  . vitamin C (ASCORBIC ACID) 500 MG tablet Take 500 mg by mouth daily.           Allergies  Allergen Reactions  . Atorvastatin   . Avandia (Rosiglitazone Maleate)   .  Cephalexin   . Clindamycin/Lincomycin   . Codeine   . Erythromycin   . Metformin   . Penicillins   . Simvastatin       Past medical history, social history, and family history reviewed and updated.  ROS: Denies orthopnea, PND, palpitations, lightheadedness or syncope.  PHYSICAL EXAM: BP 119/72  Pulse 71  Resp 18  Ht 5' 3"  (1.6 m)  Wt 128 lb 6.4 oz (58.242 kg)  BMI 22.75 kg/m2  SpO2 98%  General-Well developed; no acute distress Body habitus-fairly thin Neck-No JVD; modest radiation of systolic murmur to carotids Lungs-clear lung fields; resonant to percussion; slight kyphosis Cardiovascular-normal PMI; normal S1 and S2; grade 7-8/4 systolic ejection murmur at the cardiac base radiating to the carotids faintly Abdomen-normal bowel sounds; soft and non-tender without masses or organomegaly Musculoskeletal-No deformities, no cyanosis or clubbing Neurologic-Normal cranial nerves; symmetric strength and tone Skin-Warm, no significant lesions Extremities-distal pulses intact; no edema  ASSESSMENT AND PLAN:

## 2011-04-18 NOTE — Assessment & Plan Note (Signed)
Symptoms are improved, and physical examination shows no signs of congestive heart failure.  Current medical regimen will be continued with appropriate monitoring of electrolytes, renal function, etc.

## 2011-04-18 NOTE — Patient Instructions (Signed)
Your physician recommends that you continue on your current medications as directed. Please refer to the Current Medication list given to you today.  Your physician recommends that you return for lab work in: This week  Your physician recommends that you schedule a follow-up appointment in: 6 months

## 2011-04-18 NOTE — Assessment & Plan Note (Signed)
Multiple trials on and off statins suggest that patient was experiencing significant muscle symptoms related to these drugs.  Since her coronary disease is likely related to radiation therapy rather than traditional risk factors, suboptimal treatment of hyperlipidemia may not substantially increase her risk of recurrent coronary artery disease.  Optimal control with non-statin drugs will be appropriate.  A lipid profile will be obtained on current therapy.

## 2011-04-20 LAB — COMPREHENSIVE METABOLIC PANEL
Albumin: 4.9 g/dL (ref 3.5–5.2)
BUN: 54 mg/dL — ABNORMAL HIGH (ref 6–23)
Calcium: 10.1 mg/dL (ref 8.4–10.5)
Chloride: 105 mEq/L (ref 96–112)
Glucose, Bld: 89 mg/dL (ref 70–99)
Potassium: 4.4 mEq/L (ref 3.5–5.3)
Total Protein: 8 g/dL (ref 6.0–8.3)

## 2011-04-20 LAB — LIPID PANEL
Cholesterol: 234 mg/dL — ABNORMAL HIGH (ref 0–200)
HDL: 50 mg/dL (ref >39)
Total CHOL/HDL Ratio: 4.7 Ratio
Triglycerides: 152 mg/dL — ABNORMAL HIGH (ref <150)

## 2011-04-23 ENCOUNTER — Other Ambulatory Visit: Payer: Self-pay

## 2011-04-23 DIAGNOSIS — E785 Hyperlipidemia, unspecified: Secondary | ICD-10-CM

## 2011-04-23 MED ORDER — COLESEVELAM HCL 625 MG PO TABS: 625.0000 mg | ORAL_TABLET | Freq: Three times a day (TID) | ORAL | Status: DC

## 2011-04-30 ENCOUNTER — Telehealth: Payer: Self-pay | Admitting: Cardiology

## 2011-04-30 NOTE — Telephone Encounter (Signed)
S: Pt. Is having diarrhea and thinks it may be due to [facility]. B: On last OV with Dr. Lattie Haw on 04-18-11 pt. was advised to have a lipid profile performed. Her cholesterol level was 234 so Dr. Lattie Haw recommended that she start taking welchol 625 mg tid. A: Pt. c/o diarrhea since starting welchol R: Pt. Is advised that we will contact her with Dr. Izell Media recommendations./LV

## 2011-04-30 NOTE — Telephone Encounter (Signed)
PT THINKS THAT NEW MEDICATION WELCHOL IS CAUSING HER TO HAVE SEVERE DIARRHEA  SHE WOULD LIKE TO KNOW WHAT SEE NEEDS TO DO.

## 2011-04-30 NOTE — Telephone Encounter (Signed)
Stop Welchol and wait until diarrhea subsides. Resume once a day with a meal Try to increase every week or 2 first to BID with meals then to TID with meals. Call for recurrent diarrhea

## 2011-05-01 LAB — BASIC METABOLIC PANEL
BUN: 9
CO2: 24
Chloride: 108
Chloride: 108
Chloride: 109
Chloride: 111
Creatinine, Ser: 0.87
Creatinine, Ser: 1.16
Creatinine, Ser: 1.2
GFR calc Af Amer: 56 — ABNORMAL LOW
GFR calc Af Amer: 57 — ABNORMAL LOW
GFR calc Af Amer: 59 — ABNORMAL LOW
Glucose, Bld: 158 — ABNORMAL HIGH
Potassium: 3.7
Sodium: 135
Sodium: 137

## 2011-05-01 LAB — URINALYSIS, ROUTINE W REFLEX MICROSCOPIC
Glucose, UA: 250 — AB
Hgb urine dipstick: NEGATIVE
Protein, ur: NEGATIVE

## 2011-05-01 LAB — DIFFERENTIAL
Basophils Absolute: 0
Eosinophils Relative: 0
Lymphocytes Relative: 8 — ABNORMAL LOW
Neutro Abs: 18.1 — ABNORMAL HIGH
Neutrophils Relative %: 88 — ABNORMAL HIGH

## 2011-05-01 LAB — HEMOGLOBIN AND HEMATOCRIT, BLOOD
HCT: 27 — ABNORMAL LOW
HCT: 28 — ABNORMAL LOW
Hemoglobin: 9.3 — ABNORMAL LOW

## 2011-05-01 LAB — CBC
Hemoglobin: 9.2 — ABNORMAL LOW
Hemoglobin: 11.5 — ABNORMAL LOW
MCV: 87.1
MCV: 87.8
Platelets: 203
RBC: 3.1 — ABNORMAL LOW
RBC: 3.81 — ABNORMAL LOW
RDW: 14.3
WBC: 20.4 — ABNORMAL HIGH
WBC: 9.7

## 2011-05-01 LAB — COMPREHENSIVE METABOLIC PANEL
ALT: 33
Alkaline Phosphatase: 66
CO2: 23
GFR calc non Af Amer: 58 — ABNORMAL LOW
Glucose, Bld: 124 — ABNORMAL HIGH
Potassium: 4
Sodium: 138

## 2011-05-01 LAB — TYPE AND SCREEN: Antibody Screen: NEGATIVE

## 2011-05-01 LAB — PROTIME-INR: Prothrombin Time: 12.7

## 2011-05-02 LAB — DIFFERENTIAL
Basophils Absolute: 0
Basophils Absolute: 0.1
Basophils Relative: 1
Basophils Relative: 1
Basophils Relative: 1
Eosinophils Absolute: 0
Eosinophils Absolute: 0.1
Eosinophils Relative: 0
Eosinophils Relative: 1
Monocytes Absolute: 0.4
Monocytes Relative: 5
Neutro Abs: 6.3
Neutro Abs: 9 — ABNORMAL HIGH
Neutrophils Relative %: 78 — ABNORMAL HIGH
Neutrophils Relative %: 85 — ABNORMAL HIGH

## 2011-05-02 LAB — POCT CARDIAC MARKERS
CKMB, poc: 2.5
Myoglobin, poc: 58.8
Troponin i, poc: <0.05

## 2011-05-02 LAB — CBC
Hemoglobin: 10.8 — ABNORMAL LOW
Hemoglobin: 14.2
MCHC: 33.8
MCHC: 33.9
MCHC: 34.1
MCHC: 34.5
MCV: 86.9
MCV: 87.4
Platelets: 187
RBC: 3.52 — ABNORMAL LOW
RBC: 3.85 — ABNORMAL LOW
RBC: 4.78
RDW: 13.7
WBC: 11.5 — ABNORMAL HIGH
WBC: 16.9 — ABNORMAL HIGH

## 2011-05-02 LAB — OCCULT BLOOD X 1 CARD TO LAB, STOOL: Fecal Occult Bld: POSITIVE

## 2011-05-02 LAB — COMPREHENSIVE METABOLIC PANEL
ALT: 37 — ABNORMAL HIGH
AST: 35
Alkaline Phosphatase: 64
CO2: 25
Chloride: 104
GFR calc non Af Amer: >60
Glucose, Bld: 132 — ABNORMAL HIGH
Potassium: 4.1
Sodium: 140
Total Bilirubin: 0.9

## 2011-05-02 LAB — SAMPLE TO BLOOD BANK

## 2011-05-02 LAB — BASIC METABOLIC PANEL
BUN: 7
CO2: 26
Calcium: 8.3 — ABNORMAL LOW
Creatinine, Ser: 0.56
GFR calc Af Amer: >60

## 2011-05-02 LAB — HEMOGLOBIN A1C
Hgb A1c MFr Bld: 8 — ABNORMAL HIGH
Mean Plasma Glucose: 208

## 2011-05-02 LAB — URINALYSIS, ROUTINE W REFLEX MICROSCOPIC
Glucose, UA: 250 — AB
Ketones, ur: NEGATIVE
Specific Gravity, Urine: 1.019
pH: 5.5

## 2011-05-02 LAB — HEMOGLOBIN AND HEMATOCRIT, BLOOD
HCT: 39.4
Hemoglobin: 11.5 — ABNORMAL LOW

## 2011-05-30 ENCOUNTER — Telehealth: Payer: Self-pay | Admitting: Cardiology

## 2011-05-30 NOTE — Telephone Encounter (Signed)
Per patient, endocrinologist suggesting changing cholesterol regimen from Welchol to Pravastatin 32m.  Pt to send recent lipids for you to review.

## 2011-05-30 NOTE — Telephone Encounter (Signed)
Patient states that Endocrinologist wants to change her Cholesterol meds.  Wants to know if Dr.Rothbart would be okay with this. / tg

## 2011-06-01 ENCOUNTER — Telehealth: Payer: Self-pay | Admitting: *Deleted

## 2011-06-01 NOTE — Telephone Encounter (Signed)
Please advise 

## 2011-06-03 NOTE — Telephone Encounter (Signed)
According to our records, she is already taking pravastatin 10 mg q.d.

## 2011-06-04 ENCOUNTER — Other Ambulatory Visit: Payer: Self-pay | Admitting: *Deleted

## 2011-06-04 ENCOUNTER — Encounter: Payer: Self-pay | Admitting: *Deleted

## 2011-06-04 DIAGNOSIS — E7800 Pure hypercholesterolemia, unspecified: Secondary | ICD-10-CM

## 2011-06-04 MED ORDER — PRAVASTATIN SODIUM 10 MG PO TABS: 10.0000 mg | ORAL_TABLET | Freq: Every day | ORAL | Status: DC

## 2011-06-04 NOTE — Telephone Encounter (Signed)
Patient had stopped Pravastatin 2 visits prior, therefore we will restart Pravastatin 10 mg and recheck lipids in 1 month.

## 2011-06-09 ENCOUNTER — Encounter: Payer: Self-pay | Admitting: Cardiology

## 2011-06-26 ENCOUNTER — Ambulatory Visit (INDEPENDENT_AMBULATORY_CARE_PROVIDER_SITE_OTHER): Payer: BC Managed Care – PPO | Admitting: Cardiology

## 2011-06-26 ENCOUNTER — Encounter: Payer: Self-pay | Admitting: Cardiology

## 2011-06-26 ENCOUNTER — Encounter: Payer: Self-pay | Admitting: *Deleted

## 2011-06-26 DIAGNOSIS — I350 Nonrheumatic aortic (valve) stenosis: Secondary | ICD-10-CM

## 2011-06-26 DIAGNOSIS — I10 Essential (primary) hypertension: Secondary | ICD-10-CM

## 2011-06-26 DIAGNOSIS — Z86010 Personal history of colonic polyps: Secondary | ICD-10-CM

## 2011-06-26 DIAGNOSIS — I2510 Atherosclerotic heart disease of native coronary artery without angina pectoris: Secondary | ICD-10-CM

## 2011-06-26 DIAGNOSIS — I359 Nonrheumatic aortic valve disorder, unspecified: Secondary | ICD-10-CM

## 2011-06-26 DIAGNOSIS — E785 Hyperlipidemia, unspecified: Secondary | ICD-10-CM

## 2011-06-26 DIAGNOSIS — I509 Heart failure, unspecified: Secondary | ICD-10-CM

## 2011-06-26 DIAGNOSIS — D649 Anemia, unspecified: Secondary | ICD-10-CM

## 2011-06-26 MED ORDER — LISINOPRIL 5 MG PO TABS: 5.0000 mg | ORAL_TABLET | Freq: Every day | ORAL | Status: DC

## 2011-06-26 NOTE — Assessment & Plan Note (Signed)
Blood pressure is elevated at this visit, which is unusual for [name] [name].  She has been told of mild proteinuria in the past and would likely benefit from treatment with an ACE inhibitor.  When she returns from her vacation, she will start lisinopril 5 mg q.d. With a repeat lipid profile 3 weeks thereafter.  I will see this nice woman again in 6 weeks.

## 2011-06-26 NOTE — Assessment & Plan Note (Addendum)
Patient's endocrinologist is adjusting medications for treatment of hyperlipidemia.  She recently restarted pravastatin at low dose and has not experienced recurrent symptoms.  While rhabdomyolysis causing acute renal failure could account for fluid retention, this scenario is unlikely in the absence of muscle symptoms.  A complete metabolic profile will be obtained nonetheless as well as a magnesium level.

## 2011-06-26 NOTE — Assessment & Plan Note (Signed)
Progressive anemia could account for her symptoms, but she describes no apparent blood loss.  A repeat CBC will be obtained.

## 2011-06-26 NOTE — Assessment & Plan Note (Addendum)
Patient's episodes of fluid retention have tended not to be associated with substantial abnormalities on physical examination.  She will continue to weigh daily and take additional furosemide for more than a 3 pound weight gain until she has returned to her baseline.  If a 40 mg supplemental dose is not effective, she will take 80 mg.  She is planning a vacation cruise in [place] and will work with the ship's doctor to maintain stability while she is there.

## 2011-06-26 NOTE — Assessment & Plan Note (Signed)
With a 19 mm pericardial valve, there is a significant likelihood of patient-valve mismatch.  There is no evidence of valve dysfunction by examination.

## 2011-06-26 NOTE — Progress Notes (Signed)
Patient ID: [name] [name], female   DOB: 1950-09-21, 60 y.o.   MRN: [id number] HPI: [name] [name] is seen at her request for decompensation of chronic congestive heart failure.  This nice woman has had multiple cardiac issues.  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  This has been relatively well controlled in recent months, but over the past week or 2 she is noted increasing weight associated with a subjective sense of tissue edema, abdominal distention and increasing dyspnea on exertion.  She has had one episode of orthopnea.  A brief illness with predominantly GI symptoms occurred approximately 2 weeks ago, preceding her cardiac symptoms.  She initially took an extra 40 mg of furosemide a day as instructed.  Over 2 days, this did not provide relief, but a third dose has resulted in a 3 pound weight loss and improvement in symptoms.  Prior to Admission medications   Medication Sig Start Date End Date Taking? Authorizing Provider  ALPRAZolam Duanne Moron) 0.5 MG tablet Take 0.5 mg by mouth at bedtime as needed.     Yes Historical Provider, MD  aspirin 81 MG tablet Take 81 mg by mouth daily.     Yes Historical Provider, MD  calcium carbonate (TUMS - DOSED IN MG ELEMENTAL CALCIUM) 500 MG chewable tablet Chew 1 tablet by mouth daily.     Yes Historical Provider, MD  Calcium Carbonate-Vitamin D (RA CALCIUM PLUS VITAMIN D) 600-400 MG-UNIT per tablet Take 1 tablet by mouth daily.     Yes Historical Provider, MD  cholecalciferol (VITAMIN D) 1000 UNITS tablet Take 1,000 Units by mouth daily.     Yes Historical Provider, MD  Coenzyme Q10 (COQ10) 100 MG CAPS Take 1 capsule by mouth daily.     Yes Historical Provider, MD  colesevelam (WELCHOL) 625 MG tablet Take 1 tablet (625 mg total) by mouth 3 (three) times daily. New medication for pt. 04/23/11 04/22/12 Yes Cristopher Estimable. Trenisha Lafavor, MD    docusate sodium (COLACE) 100 MG capsule Take 100 mg by mouth 2 (two) times daily.     Yes Historical Provider, MD  furosemide (LASIX) 40 MG tablet Take 40 mg by mouth as needed.     Yes Historical Provider, MD  furosemide (LASIX) 80 MG tablet TAKE ONE (1) TABLET BY MOUTH EVERY      DAY 01/03/11  Yes Cristopher Estimable. Gweneth Fredlund, MD  insulin glulisine (APIDRA) 100 UNIT/ML injection Inject 106 Units into the skin daily.    Yes Historical Provider, MD  KLOR-CON M20 20 MEQ tablet TAKE TWO TABLETS BY MOUTH DAILY 02/03/11  Yes Cristopher Estimable. Caylyn Tedeschi, MD  metoprolol tartrate (LOPRESSOR) 25 MG tablet TAKE ONE TABLET TWICE DAILY 01/03/11  Yes Cristopher Estimable. Aluel Schwarz, MD  Multiple Vitamins-Minerals (MULTIVITAMIN WITH MINERALS) tablet Take 1 tablet by mouth daily.     Yes Historical Provider, MD  nitroGLYCERIN (NITROLINGUAL) 0.4 MG/SPRAY spray Place 1 spray under the tongue every 5 (five) minutes as needed.     Yes Historical Provider, MD  OMEGA-3 KRILL OIL 300 MG CAPS Take 1 capsule by mouth daily.     Yes Historical Provider, MD  omeprazole (PRILOSEC) 20 MG capsule Take 20 mg by mouth daily.     Yes Historical Provider, MD  pravastatin (PRAVACHOL) 10 MG tablet Take 1 tablet (10 mg total) by mouth daily. 06/04/11  Yes Cristopher Estimable. Salma Walrond, MD  sitaGLIPtan (JANUVIA) 100 MG tablet Take 100 mg  by mouth daily.     Yes Historical Provider, MD  TRICOR 145 MG tablet TAKE ONE (1) TABLET BY MOUTH EVERY      DAY 01/03/11  Yes Cristopher Estimable. Cloie Wooden, MD  vitamin C (ASCORBIC ACID) 500 MG tablet Take 500 mg by mouth daily.     Yes Historical Provider, MD  ZETIA 10 MG tablet TAKE ONE TABLET DAILY AT BEDTIME 01/30/11  Yes Cristopher Estimable. Lattie Haw, MD    Allergies  Allergen Reactions  . Atorvastatin   . Avandia (Rosiglitazone Maleate)   . Cephalexin   . Clindamycin/Lincomycin   . Codeine   . Erythromycin   . Metformin   . Penicillins   . Simvastatin       Past medical history, social history, and family history reviewed and updated.  ROS: See  history of present illness  PHYSICAL EXAM: BP 145/78  Pulse 82  Ht 5' 3"  (1.6 m)  Wt 58.06 kg (128 lb)  BMI 22.67 kg/m2  SpO2 97%; repeat blood pressure 150/80   General-Well developed; no acute distress Body habitus-proportionate weight and height Neck-Probably no JVD, prominent pulsation in the left neck appears to be arterial; no carotid bruits Lungs-clear lung fields; resonant to percussion Cardiovascular-normal PMI; normal S1 and S2; grade 2/6 basilar systolic ejection murmur; no third heart sound Abdomen-normal bowel sounds; soft and non-tender without masses or organomegaly Musculoskeletal-No deformities, no cyanosis or clubbing Neurologic-Normal cranial nerves; symmetric strength and tone Skin-Warm, no significant lesions Extremities-distal pulses intact; no edema  EKG: Normal sinus rhythm, first degree AV block, left atrial abnormality, QT prolongation that is more impressive than on a previous tracing performed 05/11/2010.  ASSESSMENT AND PLAN:  [name], MD 06/26/2011 12:41 PM

## 2011-06-26 NOTE — Patient Instructions (Signed)
Your physician recommends that you schedule a follow-up appointment in: Mid to Late January   Your physician has recommended you make the following change in your medication:  Start Lisinopril 5 mg in 2 weeks Continue to monitor weight - Take extra 40 mg of Lasix for 3 lb weight gain.  If not effective in 1 - 2 days, take extra 80 mg daily uneil weight returns to baseline.  Your physician recommends that you return for lab work in: Today and in 1 month

## 2011-06-27 ENCOUNTER — Telehealth: Payer: Self-pay | Admitting: Cardiology

## 2011-06-27 ENCOUNTER — Other Ambulatory Visit: Payer: Self-pay | Admitting: *Deleted

## 2011-06-27 DIAGNOSIS — Z85528 Personal history of other malignant neoplasm of kidney: Secondary | ICD-10-CM

## 2011-06-27 DIAGNOSIS — Z87898 Personal history of other specified conditions: Secondary | ICD-10-CM

## 2011-06-27 LAB — COMPREHENSIVE METABOLIC PANEL
ALT: 67 U/L — ABNORMAL HIGH (ref 0–35)
AST: 41 U/L — ABNORMAL HIGH (ref 0–37)
Alkaline Phosphatase: 122 U/L — ABNORMAL HIGH (ref 39–117)
Chloride: 99 mEq/L (ref 96–112)
Creat: 0.95 mg/dL (ref 0.50–1.10)
Total Bilirubin: 0.5 mg/dL (ref 0.3–1.2)

## 2011-06-27 LAB — CBC
HCT: 34.8 % — ABNORMAL LOW (ref 36.0–46.0)
Hemoglobin: 10.7 g/dL — ABNORMAL LOW (ref 12.0–15.0)
MCH: 29.1 pg (ref 26.0–34.0)
MCHC: 30.7 g/dL (ref 30.0–36.0)
MCV: 94.6 fL (ref 78.0–100.0)

## 2011-06-27 MED ORDER — INTEGRA F 125-1 MG PO CAPS: 125.0000 mg | ORAL_CAPSULE | Freq: Every day | ORAL | Status: DC

## 2011-06-27 NOTE — Telephone Encounter (Signed)
Patient states that she was told at visit to call if BP gets higher than 140/80.  BP this morning @ 0630 was 155/85.  / tg

## 2011-06-29 ENCOUNTER — Other Ambulatory Visit: Payer: Self-pay | Admitting: *Deleted

## 2011-06-29 ENCOUNTER — Encounter: Payer: Self-pay | Admitting: *Deleted

## 2011-06-29 DIAGNOSIS — E782 Mixed hyperlipidemia: Secondary | ICD-10-CM

## 2011-07-11 ENCOUNTER — Other Ambulatory Visit (HOSPITAL_COMMUNITY): Payer: Self-pay | Admitting: Urology

## 2011-07-11 ENCOUNTER — Ambulatory Visit (HOSPITAL_COMMUNITY)
Admission: RE | Admit: 2011-07-11 | Discharge: 2011-07-11 | Disposition: A | Discharge status: Home / Self Care | Payer: BC Managed Care – PPO | Source: Ambulatory Visit | Attending: Urology | Admitting: Urology

## 2011-07-11 DIAGNOSIS — Z85528 Personal history of other malignant neoplasm of kidney: Secondary | ICD-10-CM | POA: Insufficient documentation

## 2011-07-11 DIAGNOSIS — C649 Malignant neoplasm of unspecified kidney, except renal pelvis: Secondary | ICD-10-CM

## 2011-07-11 DIAGNOSIS — R0602 Shortness of breath: Secondary | ICD-10-CM | POA: Insufficient documentation

## 2011-07-20 ENCOUNTER — Encounter: Payer: Self-pay | Admitting: Cardiology

## 2011-07-20 DIAGNOSIS — K8020 Calculus of gallbladder without cholecystitis without obstruction: Secondary | ICD-10-CM | POA: Insufficient documentation

## 2011-07-26 ENCOUNTER — Other Ambulatory Visit: Payer: Self-pay | Admitting: *Deleted

## 2011-07-26 ENCOUNTER — Telehealth: Payer: Self-pay | Admitting: Cardiology

## 2011-07-26 DIAGNOSIS — R5383 Other fatigue: Secondary | ICD-10-CM

## 2011-07-26 NOTE — Telephone Encounter (Signed)
PT HAS STARTED ON LISINOPRIL AND SHE IS LOOSING HAIR, TIRED AND WOULD LIKE [name] TO ADD THYROID CHECK TO HER CURRANT LABS WE HAVE ORDERED.

## 2011-07-26 NOTE — Telephone Encounter (Signed)
Notified patient that TSH will be added to labs.

## 2011-07-30 ENCOUNTER — Other Ambulatory Visit: Payer: Self-pay | Admitting: *Deleted

## 2011-07-30 ENCOUNTER — Other Ambulatory Visit: Payer: Self-pay | Admitting: Cardiology

## 2011-07-30 DIAGNOSIS — I10 Essential (primary) hypertension: Secondary | ICD-10-CM

## 2011-07-30 LAB — BASIC METABOLIC PANEL
BUN: 49 mg/dL — ABNORMAL HIGH (ref 6–23)
Calcium: 11.1 mg/dL — ABNORMAL HIGH (ref 8.4–10.5)
Glucose, Bld: 134 mg/dL — ABNORMAL HIGH (ref 70–99)

## 2011-07-31 LAB — HEPATIC FUNCTION PANEL
Albumin: 4.8 g/dL (ref 3.5–5.2)
Total Bilirubin: 0.4 mg/dL (ref 0.3–1.2)
Total Protein: 7.7 g/dL (ref 6.0–8.3)

## 2011-07-31 LAB — TSH: TSH: 2.711 u[IU]/mL (ref 0.350–4.500)

## 2011-08-02 ENCOUNTER — Encounter: Payer: Self-pay | Admitting: *Deleted

## 2011-08-02 ENCOUNTER — Other Ambulatory Visit: Payer: Self-pay | Admitting: *Deleted

## 2011-08-02 DIAGNOSIS — I10 Essential (primary) hypertension: Secondary | ICD-10-CM

## 2011-08-13 ENCOUNTER — Ambulatory Visit (INDEPENDENT_AMBULATORY_CARE_PROVIDER_SITE_OTHER): Payer: BC Managed Care – PPO | Admitting: Cardiology

## 2011-08-13 ENCOUNTER — Encounter: Payer: Self-pay | Admitting: Cardiology

## 2011-08-13 DIAGNOSIS — R5381 Other malaise: Secondary | ICD-10-CM

## 2011-08-13 DIAGNOSIS — E785 Hyperlipidemia, unspecified: Secondary | ICD-10-CM

## 2011-08-13 DIAGNOSIS — I10 Essential (primary) hypertension: Secondary | ICD-10-CM

## 2011-08-13 DIAGNOSIS — R5383 Other fatigue: Secondary | ICD-10-CM | POA: Insufficient documentation

## 2011-08-13 NOTE — Assessment & Plan Note (Signed)
Blood pressure control is marginal.  Patient will collect additional values at home and return with them to her next visit in 6 weeks.

## 2011-08-13 NOTE — Assessment & Plan Note (Addendum)
No apparent reason for patient's recent deterioration.  TSH was normal earlier this month.  CBC shows stable mild chronic anemia at a level that would not be expected to cause the symptoms.  This would be an unusual reaction to an ACE inhibitor, but since renal function has deteriorated, that medication will be stopped.  She has also developed more significant hypercalcemia with a recent level of 11.1.  This apparently is due to deterioration in renal dysfunction perhaps related to ACE inhibitor.  A value will be rechecked in one month.

## 2011-08-13 NOTE — Assessment & Plan Note (Signed)
Most recent lipid profile was suboptimal; a repeat study will be obtained.

## 2011-08-13 NOTE — Patient Instructions (Signed)
Your physician recommends that you schedule a follow-up appointment in: 6 weeks  Your physician recommends that you return for lab work in: 1 month  Your physician has recommended you make the following change in your medication:  STOP lisinopril

## 2011-08-13 NOTE — Progress Notes (Signed)
Patient ID: [name] [name], female   DOB: 1950-12-08, 61 y.o.   MRN: [id number] HPI: Scheduled return visit for this lovely woman with a history of hypertension, coronary artery disease, aortic valve disease,hyperlipidemia and diabetes.  Since her last visit, she has not done well.  She reports excessive fatigue and daytime somnolence and has been unable to perform her usual activities.  She experiences dyspnea on mild exertion.  She falls asleep during the day despite 8-10 hours of good sleep at night.  There is no history of snoring.  Blood pressure control has been adequate at home since she started low dose lisinopril; however, she has noted hair loss in addition to the above problems and wonders if lisinopril is responsible.  Extra diuretic has been required only a handful of days since her last visit.  Prior to Admission medications   Medication Sig Start Date End Date Taking? Authorizing Provider  ALPRAZolam Duanne Moron) 0.5 MG tablet Take 0.5 mg by mouth at bedtime as needed.     Yes Historical Provider, MD  aspirin 81 MG tablet Take 81 mg by mouth daily.     Yes Historical Provider, MD  calcium carbonate (TUMS - DOSED IN MG ELEMENTAL CALCIUM) 500 MG chewable tablet Chew 1 tablet by mouth daily.     Yes Historical Provider, MD  Calcium Carbonate-Vitamin D (RA CALCIUM PLUS VITAMIN D) 600-400 MG-UNIT per tablet Take 1 tablet by mouth daily.     Yes Historical Provider, MD  cholecalciferol (VITAMIN D) 1000 UNITS tablet Take 1,000 Units by mouth daily.     Yes Historical Provider, MD  Coenzyme Q10 (COQ10) 100 MG CAPS Take 1 capsule by mouth daily.     Yes Historical Provider, MD  colesevelam (WELCHOL) 625 MG tablet Take 1 tablet (625 mg total) by mouth 3 (three) times daily. New medication for pt. 04/23/11 04/22/12 Yes Cristopher Estimable. Zona Pedro, MD  docusate sodium (COLACE) 100 MG capsule Take 100 mg by mouth 2 (two) times daily.     Yes Historical Provider, MD  Fe Fum-FePoly-FA-Vit C-Vit B3 (INTEGRA F) 125-1 MG  CAPS Take 125 mg by mouth daily. 06/27/11  Yes Lennis Marion Downer, MD  furosemide (LASIX) 40 MG tablet Take 40 mg by mouth as needed.     Yes Historical Provider, MD  furosemide (LASIX) 80 MG tablet TAKE ONE (1) TABLET BY MOUTH EVERY      DAY 01/03/11  Yes Cristopher Estimable. Wilmore Holsomback, MD  insulin glulisine (APIDRA) 100 UNIT/ML injection Inject 106 Units into the skin daily.    Yes Historical Provider, MD  KLOR-CON M20 20 MEQ tablet TAKE TWO TABLETS BY MOUTH DAILY 02/03/11  Yes Cristopher Estimable. Da Authement, MD  metoprolol tartrate (LOPRESSOR) 25 MG tablet TAKE ONE TABLET TWICE DAILY 01/03/11  Yes Cristopher Estimable. Laurisa Sahakian, MD  Multiple Vitamins-Minerals (MULTIVITAMIN WITH MINERALS) tablet Take 1 tablet by mouth daily.     Yes Historical Provider, MD  nitroGLYCERIN (NITROLINGUAL) 0.4 MG/SPRAY spray Place 1 spray under the tongue every 5 (five) minutes as needed.     Yes Historical Provider, MD  OMEGA-3 KRILL OIL 300 MG CAPS Take 1 capsule by mouth daily.     Yes Historical Provider, MD  omeprazole (PRILOSEC) 20 MG capsule Take 20 mg by mouth daily.     Yes Historical Provider, MD  pravastatin (PRAVACHOL) 10 MG tablet Take 1 tablet (10 mg total) by mouth daily. 06/04/11  Yes Cristopher Estimable. Julius Matus, MD  sitaGLIPtan (JANUVIA) 100 MG tablet Take 100 mg by mouth  daily.     Yes Historical Provider, MD  TRICOR 145 MG tablet TAKE ONE (1) TABLET BY MOUTH EVERY      DAY 01/03/11  Yes Cristopher Estimable. Teodor Prater, MD  vitamin C (ASCORBIC ACID) 500 MG tablet Take 500 mg by mouth daily.     Yes Historical Provider, MD  ZETIA 10 MG tablet TAKE ONE TABLET DAILY AT BEDTIME 01/30/11  Yes Cristopher Estimable. Lattie Haw, MD    Allergies  Allergen Reactions  . Atorvastatin   . Avandia (Rosiglitazone Maleate)   . Cephalexin   . Clindamycin/Lincomycin   . Codeine   . Erythromycin   . Iodinated Diagnostic Agents   . Metformin   . Penicillins   . Simvastatin    Past medical history, social history, and family history reviewed and updated.  ROS: See history of present  illness.  She is unable to lie flat, possibly as a result of orthopnea.  She has noted no palpitations, lightheadedness or syncope.  She has had no pedal edema.  PHYSICAL EXAM: BP 136/66  Pulse 86  Resp 16  Ht 5' 2"  (1.575 m)  Wt 58.06 kg (128 lb)  BMI 23.41 kg/m2 ; weight is unchanged General-Well developed; no acute distress Body habitus-proportionate weight and height Neck-No JVD; Left greater than right carotid bruits vs transmitted murmur; mild thyromegaly Lungs-clear lung fields; resonant to percussion Cardiovascular-normal PMI; normal S1 and S2; grade 1/6 systolic ejection murmur at the cardiac base Abdomen-normal bowel sounds; soft and non-tender without masses or organomegaly Musculoskeletal-No deformities, no cyanosis or clubbing Neurologic-Normal cranial nerves; symmetric strength and tone Skin-Warm, no significant lesions Extremities-distal pulses intact; no edema  ASSESSMENT AND PLAN:  [name], MD 08/13/2011 1:03 PM

## 2011-08-14 ENCOUNTER — Other Ambulatory Visit: Payer: Self-pay | Admitting: *Deleted

## 2011-08-14 DIAGNOSIS — I10 Essential (primary) hypertension: Secondary | ICD-10-CM

## 2011-08-14 DIAGNOSIS — E782 Mixed hyperlipidemia: Secondary | ICD-10-CM

## 2011-08-25 ENCOUNTER — Telehealth: Payer: Self-pay | Admitting: Oncology

## 2011-08-25 NOTE — Telephone Encounter (Signed)
S/w the pt's husband regarding the June 2013 appts

## 2011-08-26 ENCOUNTER — Encounter: Payer: Self-pay | Admitting: Cardiology

## 2011-09-12 ENCOUNTER — Other Ambulatory Visit: Payer: Self-pay | Admitting: Cardiology

## 2011-09-13 ENCOUNTER — Encounter: Payer: Self-pay | Admitting: *Deleted

## 2011-09-13 LAB — COMPREHENSIVE METABOLIC PANEL
ALT: 34 U/L (ref 0–35)
Albumin: 5 g/dL (ref 3.5–5.2)
Alkaline Phosphatase: 94 U/L (ref 39–117)
Glucose, Bld: 119 mg/dL — ABNORMAL HIGH (ref 70–99)
Potassium: 4 mEq/L (ref 3.5–5.3)
Sodium: 142 mEq/L (ref 135–145)
Total Protein: 8.1 g/dL (ref 6.0–8.3)

## 2011-09-13 LAB — CBC
Hemoglobin: 11.4 g/dL — ABNORMAL LOW (ref 12.0–15.0)
MCHC: 30.4 g/dL (ref 30.0–36.0)
Platelets: 241 10*3/uL (ref 150–400)
RBC: 4.16 MIL/uL (ref 3.87–5.11)

## 2011-09-17 ENCOUNTER — Encounter: Payer: Self-pay | Admitting: *Deleted

## 2011-09-19 ENCOUNTER — Encounter: Payer: Self-pay | Admitting: Cardiology

## 2011-09-19 ENCOUNTER — Ambulatory Visit (INDEPENDENT_AMBULATORY_CARE_PROVIDER_SITE_OTHER): Payer: BC Managed Care – PPO | Admitting: Cardiology

## 2011-09-19 VITALS — BP 155/76 | HR 82 | Resp 18 | Ht 63.0 in | Wt 123.0 lb

## 2011-09-19 DIAGNOSIS — M8580 Other specified disorders of bone density and structure, unspecified site: Secondary | ICD-10-CM

## 2011-09-19 DIAGNOSIS — D649 Anemia, unspecified: Secondary | ICD-10-CM

## 2011-09-19 DIAGNOSIS — I2510 Atherosclerotic heart disease of native coronary artery without angina pectoris: Secondary | ICD-10-CM

## 2011-09-19 DIAGNOSIS — M949 Disorder of cartilage, unspecified: Secondary | ICD-10-CM

## 2011-09-19 DIAGNOSIS — I10 Essential (primary) hypertension: Secondary | ICD-10-CM

## 2011-09-19 DIAGNOSIS — E785 Hyperlipidemia, unspecified: Secondary | ICD-10-CM

## 2011-09-19 DIAGNOSIS — K7689 Other specified diseases of liver: Secondary | ICD-10-CM

## 2011-09-19 DIAGNOSIS — K7581 Nonalcoholic steatohepatitis (NASH): Secondary | ICD-10-CM

## 2011-09-19 NOTE — Assessment & Plan Note (Signed)
Blood pressure control is good today without orthostatic decrease.

## 2011-09-19 NOTE — Assessment & Plan Note (Addendum)
Patient has remained stable with respect to coronary disease following surgical revascularization, I doubt that she will experience additional symptoms related to this problem in the foreseeable future.

## 2011-09-19 NOTE — Assessment & Plan Note (Addendum)
Most recent lipid profile is suboptimal, but patient is intolerant of high-dose statin therapy.  I have suspected that coronary disease is related to radiation therapy rather than ASCVD.  She has had single vessel disease without apparent progression over the past 10 years.  I think it likely that she will not be troubled by coronary disease in the future regardless of the adequacy of control of hyperlipidemia.  Nonetheless, we will use alternative medication to lower her cholesterol to the extent possible.

## 2011-09-19 NOTE — Progress Notes (Signed)
Patient ID: [name] [name], female   DOB: 06-18-1951, 61 y.o.   MRN: [id number] HPI: Scheduled return visit for this lovely woman with multiple cardiac problems including single-vessel coronary disease and aortic valve disease for which aortic valve replacement was performed a few years ago.  She continues to have problems with intermittent CHF, likely the result of diastolic dysfunction, but has done well since her last visit with good control of hypertension and little in the way of fatigue or dyspnea.  She has noticed increased hair loss, but no thinning of the hair.  She has a chronic anemia that was improved on a recent CBC and is now minimal.  Generally minimal hypercalcemia worsened when she developed renal insufficiency with an ACE inhibitor, but has now returned to baseline.  Renal function has improved with discontinuation of lisinopril.  A week or 2 ago she noted the sudden onset of lightheadedness and near syncope.  She lowered herself to the floor where she recovered over the course of a few minutes.  After resuming the standing position and resuming her work in [employer], she experienced another spell with a similar time course.  When her husband returned from working outside, he assisted her to measure serum glucose and blood pressure, both of which were normal.  She experienced palpitations with a sense of her heart beating in her throat with both spells.  She has no history of orthostatic hypotension nor of arrhythmia.  Prior to Admission medications   Medication Sig Start Date End Date Taking? Authorizing Provider  ALPRAZolam Duanne Moron) 0.5 MG tablet Take 0.5 mg by mouth at bedtime as needed.     Yes Historical Provider, MD  aspirin 81 MG tablet Take 81 mg by mouth daily.     Yes Historical Provider, MD  calcium carbonate (TUMS - DOSED IN MG ELEMENTAL CALCIUM) 500 MG chewable tablet Chew 1 tablet by mouth daily.     Yes Historical Provider, MD  Calcium Carbonate-Vitamin D (RA CALCIUM PLUS  VITAMIN D) 600-400 MG-UNIT per tablet Take 1 tablet by mouth daily.     Yes Historical Provider, MD  cholecalciferol (VITAMIN D) 1000 UNITS tablet Take 1,000 Units by mouth daily.     Yes Historical Provider, MD  Coenzyme Q10 (COQ10) 100 MG CAPS Take 600 mg by mouth daily.    Yes Historical Provider, MD  Fe Fum-FePoly-FA-Vit C-Vit B3 (INTEGRA F) 125-1 MG CAPS Take 125 mg by mouth daily. 06/27/11  Yes Lennis Marion Downer, MD  furosemide (LASIX) 40 MG tablet Take 40 mg by mouth as needed.     Yes Historical Provider, MD  furosemide (LASIX) 80 MG tablet TAKE ONE (1) TABLET BY MOUTH EVERY      DAY 01/03/11  Yes Cristopher Estimable. Chace Klippel, MD  insulin glulisine (APIDRA) 100 UNIT/ML injection Inject into the skin as directed.    Yes Historical Provider, MD  KLOR-CON M20 20 MEQ tablet TAKE TWO TABLETS BY MOUTH DAILY 02/03/11  Yes Cristopher Estimable. Lynkin Saini, MD  metoprolol tartrate (LOPRESSOR) 25 MG tablet TAKE ONE TABLET TWICE DAILY 01/03/11  Yes Cristopher Estimable. Dene Nazir, MD  Multiple Vitamins-Minerals (MULTIVITAMIN WITH MINERALS) tablet Take 1 tablet by mouth daily.     Yes Historical Provider, MD  nitroGLYCERIN (NITROLINGUAL) 0.4 MG/SPRAY spray Place 1 spray under the tongue every 5 (five) minutes as needed.     Yes Historical Provider, MD  OMEGA-3 KRILL OIL 300 MG CAPS Take 1 capsule by mouth daily.     Yes Historical Provider, MD  omeprazole (PRILOSEC) 20 MG capsule Take 20 mg by mouth as needed.    Yes Historical Provider, MD  pravastatin (PRAVACHOL) 10 MG tablet Take 1 tablet (10 mg total) by mouth daily. 06/04/11  Yes Cristopher Estimable. Dalyn Becker, MD  sitaGLIPtan (JANUVIA) 100 MG tablet Take 100 mg by mouth daily.     Yes Historical Provider, MD  TRICOR 145 MG tablet TAKE ONE (1) TABLET BY MOUTH EVERY      DAY 01/03/11  Yes Cristopher Estimable. Jian Hodgman, MD  ZETIA 10 MG tablet TAKE ONE TABLET DAILY AT BEDTIME 01/30/11  Yes Cristopher Estimable. Lattie Haw, MD    Allergies  Allergen Reactions  . Atorvastatin   . Avandia (Rosiglitazone Maleate)   . Cephalexin    . Clindamycin/Lincomycin   . Codeine   . Erythromycin   . Iodinated Diagnostic Agents   . Metformin   . Penicillins   . Simvastatin       Past medical history, social history, and family history reviewed and updated.  ROS: Denies orthopnea, PND, chest discomfort and pedal edema.  PHYSICAL EXAM: BP 155/76  Pulse 82  Resp 18  Ht 5' 3"  (1.6 m)  Wt 55.792 kg (123 lb)  BMI 21.79 kg/m2   General-Well developed; no acute distress Body habitus-proportionate weight and height Neck-No JVD; Transmitted murmur to carotids Bilaterally Lungs-clear lung fields; resonant to percussion Cardiovascular-normal PMI; normal S1 and S2; grade 2/6 basilar systolic ejection murmur Abdomen-normal bowel sounds; soft and non-tender without masses or organomegaly Musculoskeletal-No deformities, no cyanosis or clubbing Neurologic-Normal cranial nerves; symmetric strength and tone Skin-Warm, no significant lesions Extremities-distal pulses intact; no edema  ASSESSMENT AND PLAN:  [name], MD 09/19/2011 4:12 PM

## 2011-09-19 NOTE — Assessment & Plan Note (Signed)
Osteopenia could be related to borderline hyperparathyroidism.  Bone densitometry was last performed 2 years ago.  A repeat study would be of interest.

## 2011-09-19 NOTE — Patient Instructions (Signed)
**Note De-Identified Ivie Savitt Obfuscation** Your physician recommends that you return for lab work in: today  Your physician recommends that you schedule a follow-up appointment in: 3 months  Please call office at [phone] for recurrent dizziness or palpitations

## 2011-09-19 NOTE — Assessment & Plan Note (Addendum)
Calcium level has decreased from a peak of 11.1 to 10.6.  Dr. Eden Emms has reviewed laboratory studies and advised patient that there is inadequate evidence for true hyperparathyroidism.  She does appear to have at least a borderline abnormality with a low normal PTH level.  Borderline hypercalcemia was likely exacerbated by administration of vitamin D and calcium plus a degree of acute renal failure associated with ACE inhibitor.

## 2011-09-19 NOTE — Assessment & Plan Note (Signed)
LFTs have been minimally abnormal on recent determinations, but have returned to normal as of testing in 08/2011.

## 2011-09-19 NOTE — Assessment & Plan Note (Signed)
Anemia has improved with an increase in H&H to 11.4/37.5.

## 2011-09-20 LAB — BASIC METABOLIC PANEL
BUN: 39 mg/dL — ABNORMAL HIGH (ref 6–23)
Calcium: 10.7 mg/dL — ABNORMAL HIGH (ref 8.4–10.5)
Creat: 1.2 mg/dL — ABNORMAL HIGH (ref 0.50–1.10)
Glucose, Bld: 105 mg/dL — ABNORMAL HIGH (ref 70–99)
Potassium: 4.1 mEq/L (ref 3.5–5.3)

## 2011-10-04 ENCOUNTER — Other Ambulatory Visit: Payer: Self-pay | Admitting: *Deleted

## 2011-10-04 ENCOUNTER — Telehealth: Payer: Self-pay | Admitting: Cardiology

## 2011-10-04 DIAGNOSIS — R55 Syncope and collapse: Secondary | ICD-10-CM

## 2011-10-04 NOTE — Telephone Encounter (Signed)
PT HAD PRESYNCOPAL EPISODE WHEN WALKING BACK FROM USING BATHROOM. YESTERDAY AFTERNOON. BLOOD SUGAR WAS 200, WAS NOT ABLE TO CHECK BLOOD PRESSURE AFTER ABOUT 15MIN SHE WAS FINE AND HAS BEEN FINE SINCE. SHE DOES HAVE A COLD.  NO HEART BEAT IN THROAT THIS TIME.

## 2011-10-04 NOTE — Telephone Encounter (Signed)
Attempted to contact patient this am at 10:25 with no answer.  Spoke with patient regarding yesterday's incident at this time.  States that she had been standing for approx 45 minutes and went to the bathroom.  Upon returning to her seat, she stated that things went black and she felt she was going to pass out, however did not.  States she is being treated for cold symptoms and just attributed it to that.  Per pt, blood pressures have been normal, as well as her glucose. Has had episodes such as this in the past and a plan for an event monitor was discussed with any further issues, therefore will order a 21 day monitor today and apply it as soon as it arrives.  In the meantime, pt was instructed to seek medical attention if she experiences any more problems.

## 2011-10-05 ENCOUNTER — Telehealth: Payer: Self-pay | Admitting: Cardiology

## 2011-10-05 NOTE — Telephone Encounter (Signed)
[facility] IS CALLING BECAUSE THEY NEED TO KNOW IF THEY ARE TO SHIP MONITOR TO OUR OFFICE. CALL BACK [phone]

## 2011-10-09 DIAGNOSIS — R55 Syncope and collapse: Secondary | ICD-10-CM

## 2011-10-15 ENCOUNTER — Other Ambulatory Visit: Payer: Self-pay | Admitting: *Deleted

## 2011-10-15 ENCOUNTER — Other Ambulatory Visit: Payer: Self-pay | Admitting: Internal Medicine

## 2011-10-15 DIAGNOSIS — Z85528 Personal history of other malignant neoplasm of kidney: Secondary | ICD-10-CM

## 2011-10-15 DIAGNOSIS — Z87898 Personal history of other specified conditions: Secondary | ICD-10-CM

## 2011-10-15 MED ORDER — INTEGRA F 125-1 MG PO CAPS: 125.0000 mg | ORAL_CAPSULE | Freq: Every day | ORAL | Status: DC

## 2011-10-30 ENCOUNTER — Other Ambulatory Visit: Payer: Self-pay | Admitting: Cardiology

## 2011-11-05 ENCOUNTER — Other Ambulatory Visit: Payer: Self-pay | Admitting: Cardiology

## 2011-11-05 DIAGNOSIS — R55 Syncope and collapse: Secondary | ICD-10-CM

## 2011-11-23 ENCOUNTER — Other Ambulatory Visit: Payer: Self-pay | Admitting: Cardiology

## 2011-11-30 ENCOUNTER — Emergency Department (HOSPITAL_COMMUNITY)
Admission: EM | Admit: 2011-11-30 | Discharge: 2011-12-01 | Disposition: A | Discharge status: Home / Self Care | Payer: BC Managed Care – PPO | Attending: Emergency Medicine | Admitting: Emergency Medicine

## 2011-11-30 ENCOUNTER — Encounter (HOSPITAL_COMMUNITY): Payer: Self-pay | Admitting: *Deleted

## 2011-11-30 ENCOUNTER — Emergency Department (HOSPITAL_COMMUNITY): Payer: BC Managed Care – PPO

## 2011-11-30 DIAGNOSIS — I10 Essential (primary) hypertension: Secondary | ICD-10-CM | POA: Insufficient documentation

## 2011-11-30 DIAGNOSIS — I509 Heart failure, unspecified: Secondary | ICD-10-CM | POA: Insufficient documentation

## 2011-11-30 DIAGNOSIS — R079 Chest pain, unspecified: Secondary | ICD-10-CM | POA: Insufficient documentation

## 2011-11-30 DIAGNOSIS — R55 Syncope and collapse: Secondary | ICD-10-CM | POA: Diagnosis present

## 2011-11-30 DIAGNOSIS — E785 Hyperlipidemia, unspecified: Secondary | ICD-10-CM | POA: Insufficient documentation

## 2011-11-30 DIAGNOSIS — E119 Type 2 diabetes mellitus without complications: Secondary | ICD-10-CM | POA: Insufficient documentation

## 2011-11-30 DIAGNOSIS — Z8571 Personal history of Hodgkin lymphoma: Secondary | ICD-10-CM | POA: Insufficient documentation

## 2011-11-30 DIAGNOSIS — R0602 Shortness of breath: Secondary | ICD-10-CM | POA: Insufficient documentation

## 2011-11-30 DIAGNOSIS — R42 Dizziness and giddiness: Secondary | ICD-10-CM | POA: Insufficient documentation

## 2011-11-30 DIAGNOSIS — I359 Nonrheumatic aortic valve disorder, unspecified: Secondary | ICD-10-CM | POA: Insufficient documentation

## 2011-11-30 DIAGNOSIS — I2510 Atherosclerotic heart disease of native coronary artery without angina pectoris: Secondary | ICD-10-CM | POA: Insufficient documentation

## 2011-11-30 DIAGNOSIS — R9431 Abnormal electrocardiogram [ECG] [EKG]: Secondary | ICD-10-CM | POA: Insufficient documentation

## 2011-11-30 NOTE — ED Notes (Signed)
Pt states loss of consciousness with CP today.  Has had 2 episodes this year.  Has been on heart monitor.  Some dizziness right now.

## 2011-12-01 ENCOUNTER — Emergency Department (HOSPITAL_COMMUNITY): Payer: BC Managed Care – PPO

## 2011-12-01 DIAGNOSIS — R55 Syncope and collapse: Secondary | ICD-10-CM | POA: Diagnosis present

## 2011-12-01 LAB — CBC
Hemoglobin: 11.8 g/dL — ABNORMAL LOW (ref 12.0–15.0)
MCH: 27.6 pg (ref 26.0–34.0)
Platelets: 193 10*3/uL (ref 150–400)
RBC: 4.27 MIL/uL (ref 3.87–5.11)

## 2011-12-01 LAB — PRO B NATRIURETIC PEPTIDE: Pro B Natriuretic peptide (BNP): 326.6 pg/mL — ABNORMAL HIGH (ref 0–125)

## 2011-12-01 LAB — BASIC METABOLIC PANEL
Calcium: 10.2 mg/dL (ref 8.4–10.5)
GFR calc Af Amer: 70 mL/min — ABNORMAL LOW (ref >90)
GFR calc non Af Amer: 60 mL/min — ABNORMAL LOW (ref >90)
Glucose, Bld: 203 mg/dL — ABNORMAL HIGH (ref 70–99)
Potassium: 3.9 mEq/L (ref 3.5–5.1)
Sodium: 138 mEq/L (ref 135–145)

## 2011-12-01 LAB — POCT I-STAT TROPONIN I: Troponin i, poc: 0 ng/mL (ref 0.00–0.08)

## 2011-12-01 NOTE — ED Provider Notes (Addendum)
History     CSN: [id number]  Arrival date & time 11/30/11  2213   First MD Initiated Contact with Patient 11/30/11 2336      Chief Complaint  Patient presents with  . Chest Pain  . Loss of Consciousness    (Consider location/radiation/quality/duration/timing/severity/associated sxs/prior treatment) Patient is a 61 y.o. female presenting with syncope. The history is provided by the patient. No language interpreter was used.  Loss of Consciousness This is a recurrent problem. The current episode started 3 to 5 hours ago. The problem occurs constantly. The problem has been resolved. Associated symptoms include shortness of breath. Pertinent negatives include no chest pain, no abdominal pain and no headaches. The symptoms are aggravated by nothing. The symptoms are relieved by nothing. She has tried nothing for the symptoms. The treatment provided significant relief.  Had palpitations this evening felt as if she was going to pass out and called her husband and then awoke to find 911 calling to say did she need an ambulance apparently she had called them prior to passing out but has no recollection of same.  States she spoke with Dr. Lattie Haw who told her to come in for evaluation and admission to [facility] cardiology.    Past Medical History  Diagnosis Date  . Aortic stenosis      bicuspid aortic valve; mean gradient of 20 mmHg in 2/10; aortic valve   replacement surgery(19-mm Edwards pericardial valve) with a redo coronary artery bypass graft procedure      in 07/2009; postoperative Dressler's syndrome    . Coronary artery disease     status post RCA saphenous vein graft in 06/1999; multiple preceding PCIs with restenosis; total obstruction of RCA in 2006 with patent graft   . CHF (congestive heart failure)     History of CHF; recurred following AVR/CABG surgery; intermittent PND  . Hyperlipidemia   . Hypertension   . Small bowel obstruction     Recurrent; resolved after resection of a  lipoma  . Osteopenia      hip on DEXA in October 2007.  Marland Kitchen Anemia   . Carcinoma, renal cell 05/2007    Laparoscopic right nephrectomy  . NASH (nonalcoholic steatohepatitis) 1999    -biopsy in 1999  . Diabetes mellitus 03/2010     TYPE II: Hemoglobin A1c of 7.4 in  03/2010; 8.4 in 06/2010; treated with insulin pump  . Migraines   . Duodenal ulcer     Remote; H. pylori positive  . Colitis, ischemic 2008  . Gastroesophageal reflux disease     ? of GASTROPARESIS; IRRITABLE BOWEL SYNDROME  . Hodgkin's disease 1991    Mantle radiation therapy  . Exertional dyspnea     chest discomfort  . Statin intolerance   . Hypercalcemia     Ca-11.1 in 07/2011    Past Surgical History  Procedure Date  . Total abdominal hysterectomy w/ bilateral salpingoophorectomy 2000  . Breast excisional biopsy     benign  . Nephrectomy 2008    Laparoscopic right nephrectomy, renal cell carcinoma  . Coronary artery bypass graft 2005, 2011    CABG-most recent SVG to RCA-12/05; RCA occluded with patent graft in 2006; Redo bypass in 07/2009  . Tumor excision 1981    removal of Hodgkins lymphoma  . Exploratory laparotomy w/ bowel resection     Small bowel resection of lipoma  . Aortic valve replacement 2011    Aortic valve replacement surgery with a 19 mm bioprosthetic device post redo CABG January  2011.  . Colonoscopy   . Cervical biopsy   . Liver biopsy     History reviewed. No pertinent family history.  History  Substance Use Topics  . Smoking status: Never Smoker   . Smokeless tobacco: Never Used  . Alcohol Use: No    OB History    Grav Para Term Preterm Abortions TAB SAB Ect Mult Living                  Review of Systems  Respiratory: Positive for shortness of breath.   Cardiovascular: Positive for syncope. Negative for chest pain and leg swelling.  Gastrointestinal: Negative for abdominal pain.  Neurological: Negative for headaches.  All other systems reviewed and are  negative.    Allergies  Atorvastatin; Avandia; Cephalexin; Clindamycin/lincomycin; Codeine; Erythromycin; Iodinated diagnostic agents; Metformin; Penicillins; and Simvastatin  Home Medications   Current Outpatient Rx  Name Route Sig Dispense Refill  . ALPRAZOLAM 0.5 MG PO TABS Oral Take 0.25 mg by mouth at bedtime.     . ASPIRIN 81 MG PO TABS Oral Take 81 mg by mouth daily.      Marland Kitchen CALCIUM CARBONATE-VITAMIN D 600-400 MG-UNIT PO TABS Oral Take 1 tablet by mouth 2 (two) times daily.     Marland Kitchen VITAMIN D 1000 UNITS PO TABS Oral Take 1,000 Units by mouth daily.      . COQ10 PO Oral Take 1 capsule by mouth daily.    . INTEGRA F 125-1 MG PO CAPS Oral Take 125 mg by mouth daily. 90 capsule 1  . FENOFIBRATE 145 MG PO TABS  TAKE ONE (1) TABLET BY MOUTH EVERY      DAY 90 tablet 1  . FUROSEMIDE 40 MG PO TABS Oral Take 40 mg by mouth as needed.      . FUROSEMIDE 80 MG PO TABS  TAKE ONE (1) TABLET BY MOUTH EVERY      DAY 60 tablet 6  . INSULIN GLULISINE 100 UNIT/ML IJ SOLN Subcutaneous Inject into the skin daily. Sliding scale (insulin pump)    . KLOR-CON M20 20 MEQ PO TBCR  TAKE TWO TABLETS BY MOUTH DAILY 60 each 6  . METOPROLOL TARTRATE 25 MG PO TABS  TAKE ONE TABLET TWICE DAILY 180 tablet 1  . MULTI-VITAMIN/MINERALS PO TABS Oral Take 1 tablet by mouth daily.      Marland Kitchen NITROGLYCERIN 0.4 MG/SPRAY TL SOLN Sublingual Place 1 spray under the tongue every 5 (five) minutes as needed.      . OMEGA-3 KRILL OIL 300 MG PO CAPS Oral Take 1 capsule by mouth daily.      Marland Kitchen OMEPRAZOLE 20 MG PO CPDR Oral Take 20 mg by mouth daily.     Marland Kitchen PRAVASTATIN SODIUM 10 MG PO TABS Oral Take 1 tablet (10 mg total) by mouth daily. 90 tablet 3    PATIENT WOULD LIKE A 90 DAYS SUPPLY PLEASE  . SITAGLIPTIN PHOSPHATE 100 MG PO TABS Oral Take 100 mg by mouth daily.      Marland Kitchen ZETIA 10 MG PO TABS  TAKE ONE TABLET DAILY AT BEDTIME 90 tablet 3    PATIENT WOULD LIKE 90 DAY SUPPLY AT A TIME.    BP 129/75  Pulse 88  Temp(Src) 97.8 F (36.6 C)  (Oral)  Resp 16  SpO2 98%  Physical Exam  Constitutional: She is oriented to person, place, and time. She appears well-developed and well-nourished. No distress.  HENT:  Head: Normocephalic and atraumatic.  Mouth/Throat: Oropharynx is clear and  moist.  Eyes: Conjunctivae are normal. Pupils are equal, round, and reactive to light.  Neck: Normal range of motion. Neck supple.  Cardiovascular: Normal rate and regular rhythm.   Pulmonary/Chest: Effort normal and breath sounds normal. She has no wheezes.  Abdominal: Soft. Bowel sounds are normal. There is no tenderness. There is no rebound and no guarding.  Musculoskeletal: Normal range of motion. She exhibits no edema.  Neurological: She is alert and oriented to person, place, and time.  Skin: Skin is warm and dry.  Psychiatric: She has a normal mood and affect.    ED Course  Procedures (including critical care time)  Labs Reviewed  CBC - Abnormal; Notable for the following:    Hemoglobin 11.8 (*)    All other components within normal limits  POCT I-STAT TROPONIN I  BASIC METABOLIC PANEL  PRO B NATRIURETIC PEPTIDE   Dg Chest 2 View  11/30/2011  *RADIOLOGY REPORT*  Clinical Data: Chest pain.  Loss of consciousness.  CHEST - 2 VIEW  Comparison: PA and lateral chest 07/11/2011.  Findings: The patient is status post CABG and aortic valve replacement.  Coronary artery stent is noted.  Heart size is normal.  Lungs are clear.  No pneumothorax or effusion.  IMPRESSION: No acute finding.  Original Report Authenticated By: Arvid Right. Luther Parody, M.D.     No diagnosis found.    MDM   Date: 12/01/2011  Rate: 95  Rhythm: accelerated junctional rhythm  QRS Axis: normal  Intervals: QT prolonged  ST/T Wave abnormalities: normal  Conduction Disutrbances:none  Narrative Interpretation:   Old EKG Reviewed: changes noted      Seen by cardiology who felt patient was orthostatic and safe for discharge  Chonda Baney K Deondrea Markos-Rasch, MD 12/01/11  [phone]

## 2011-12-05 ENCOUNTER — Telehealth: Payer: Self-pay | Admitting: Cardiology

## 2011-12-05 NOTE — Telephone Encounter (Signed)
Appointment was made for tomorrow with Dr Lattie Haw

## 2011-12-05 NOTE — Telephone Encounter (Signed)
Patient is still having episodes of arrythmia.  / tg

## 2011-12-06 ENCOUNTER — Encounter: Payer: Self-pay | Admitting: *Deleted

## 2011-12-06 ENCOUNTER — Ambulatory Visit (INDEPENDENT_AMBULATORY_CARE_PROVIDER_SITE_OTHER): Payer: BC Managed Care – PPO | Admitting: Cardiology

## 2011-12-06 ENCOUNTER — Encounter: Payer: Self-pay | Admitting: Cardiology

## 2011-12-06 VITALS — BP 156/85 | HR 83 | Resp 16 | Ht 62.0 in | Wt 123.0 lb

## 2011-12-06 DIAGNOSIS — D649 Anemia, unspecified: Secondary | ICD-10-CM

## 2011-12-06 DIAGNOSIS — I10 Essential (primary) hypertension: Secondary | ICD-10-CM

## 2011-12-06 DIAGNOSIS — Z01818 Encounter for other preprocedural examination: Secondary | ICD-10-CM

## 2011-12-06 DIAGNOSIS — I359 Nonrheumatic aortic valve disorder, unspecified: Secondary | ICD-10-CM

## 2011-12-06 DIAGNOSIS — R55 Syncope and collapse: Secondary | ICD-10-CM

## 2011-12-06 DIAGNOSIS — I509 Heart failure, unspecified: Secondary | ICD-10-CM

## 2011-12-06 DIAGNOSIS — I350 Nonrheumatic aortic (valve) stenosis: Secondary | ICD-10-CM

## 2011-12-06 LAB — APTT: aPTT: 35 seconds (ref 24–37)

## 2011-12-06 LAB — PROTIME-INR: Prothrombin Time: 14 seconds (ref 11.6–15.2)

## 2011-12-06 NOTE — Assessment & Plan Note (Addendum)
Remains well compensated with stable weight and symptoms.  Despite a mild elevation in BUN, current dose of furosemide will be continued, as she has required mild prerenal azotemia to maintain compensation of CHF.

## 2011-12-06 NOTE — Progress Notes (Signed)
Patient ID: [name] [name], female   DOB: [birth year], 61 y.o.   MRN: [id number]  HPI: Patient seen by referral from the emergency department where she was evaluated a few days ago for palpitations associated with near syncope.  This nice woman has had similar symptoms on 3 occasions over the past 4-5 months, but Friday's spell was more prolonged and severe.  Symptoms persisted for approximately 20 minutes, but resolved prior to the arrival of EMS.  Rhythm was normal sinus when she was first seen.  She was transported to the emergency department where mild elevation in BUN was felt to represent prerenal azotemia, but her symptoms were attributed to a neurocardiogenic mechanism.  She had a recurrent spell following discharge from the ED lasting a matter of minutes.  She has felt fine since, but with some malaise, fatigue and exertion intolerance.  Prior to Admission medications   Medication Sig Start Date End Date Taking? Authorizing Provider  ALPRAZolam Duanne Moron) 0.5 MG tablet Take 0.25 mg by mouth at bedtime.    Yes Historical Provider, MD  aspirin 81 MG tablet Take 81 mg by mouth daily.     Yes Historical Provider, MD  Calcium Carbonate-Vitamin D (RA CALCIUM PLUS VITAMIN D) 600-400 MG-UNIT per tablet Take 1 tablet by mouth 2 (two) times daily.    Yes Historical Provider, MD  cholecalciferol (VITAMIN D) 1000 UNITS tablet Take 1,000 Units by mouth daily.     Yes Historical Provider, MD  Coenzyme Q10 (COQ10 PO) Take 1 capsule by mouth daily.   Yes Historical Provider, MD  Fe Fum-FePoly-FA-Vit C-Vit B3 (INTEGRA F) 125-1 MG CAPS Take 125 mg by mouth daily. 10/15/11  Yes Lennis Marion Downer, MD  fenofibrate (TRICOR) 145 MG tablet TAKE ONE (1) TABLET BY MOUTH EVERY      DAY 10/30/11  Yes Yehuda Savannah, MD  furosemide (LASIX) 80 MG tablet  11/23/11  Yes Yehuda Savannah, MD  insulin glulisine (APIDRA) 100 UNIT/ML injection Inject into the skin daily. Sliding scale (insulin pump)   Yes Historical Provider, MD    metoprolol tartrate (LOPRESSOR) 25 MG tablet TAKE ONE TABLET TWICE DAILY 10/30/11  Yes Yehuda Savannah, MD  Multiple Vitamins-Minerals (MULTIVITAMIN WITH MINERALS) tablet Take 1 tablet by mouth daily.     Yes Historical Provider, MD  nitroGLYCERIN (NITROLINGUAL) 0.4 MG/SPRAY spray Place 1 spray under the tongue every 5 (five) minutes as needed.     Yes Historical Provider, MD  OMEGA-3 KRILL OIL 300 MG CAPS Take 1 capsule by mouth daily.     Yes Historical Provider, MD  omeprazole (PRILOSEC) 20 MG capsule Take 20 mg by mouth daily.    Yes Historical Provider, MD  potassium chloride SA (KLOR-CON M20) 20 MEQ tablet  11/23/11  Yes Yehuda Savannah, MD  pravastatin (PRAVACHOL) 10 MG tablet Take 1 tablet (10 mg total) by mouth daily. 06/04/11  Yes Yehuda Savannah, MD  sitaGLIPtan (JANUVIA) 100 MG tablet Take 100 mg by mouth daily.     Yes Historical Provider, MD  ZETIA 10 MG tablet TAKE ONE TABLET DAILY AT BEDTIME 01/30/11  Yes Yehuda Savannah, MD    Allergies  Allergen Reactions  . Atorvastatin   . Avandia (Rosiglitazone Maleate)   . Cephalexin   . Clindamycin/Lincomycin   . Codeine   . Erythromycin   . Iodinated Diagnostic Agents   . Metformin   . Penicillins   . Simvastatin     Past medical history, social history, and family  history reviewed and updated.  ROS: Denies chest discomfort, orthostatic lightheadedness, orthopnea, PND or peripheral edema.  Control of diabetes has been suboptimal of late.  Her ability to manage diet and exercise has been overwhelmed by work demands.  PHYSICAL EXAM: BP 156/85  Pulse 83  Resp 16  Ht 5' 2"  (1.575 m)  Wt 55.792 kg (123 lb)  BMI 22.50 kg/m2  General-Well developed; no acute distress Body habitus-Thin Neck-No JVD; no carotid bruits-minimal transmitted murmur Lungs-Coarse bibasilar breath sounds with minimal rales; resonant to percussion; mild kyphosis; median sternotomy scar x2 Cardiovascular-normal PMI; normal S1; marked increase in  intensity of S2; grade 2/6 basilar systolic ejection murmur Abdomen-normal bowel sounds; soft and non-tender without masses or organomegaly; insulin pump carried on the belt Musculoskeletal-No deformities, no cyanosis or clubbing Neurologic-Normal cranial nerves; symmetric strength and tone Skin-Warm, no significant lesions Extremities-distal pulses intact; no edema  Rhythm Strip: Normal sinus rhythm, first degree AV block with a PR interval of 280-290 ms.  ASSESSMENT AND PLAN:  [name], MD 12/06/2011 12:44 PM

## 2011-12-06 NOTE — Assessment & Plan Note (Signed)
History is highly suggestive of an arrhythmia, most likely a tachyarrhythmia, causing cerebral hypoperfusion.  Patient has carried an event recorder for 3 weeks without documentation of an arrhythmia; however, no symptomatic spells occurred during this interval.  Case discussed with Dr. Lovena Le, who agrees that an implanted loop recorder is appropriate at this point.  Risks and benefits were described including device infection.  She agrees to proceed.

## 2011-12-06 NOTE — Assessment & Plan Note (Signed)
Blood pressure is slightly elevated in the office, but multiple determinations at home have been excellent.  There is no orthostatic change to suggest hypotension as a cause for near-syncope.

## 2011-12-06 NOTE — Assessment & Plan Note (Signed)
Anemia is minimal at this point without a clear etiology.

## 2011-12-06 NOTE — Progress Notes (Deleted)
Name: [name] [name]    DOB: [birth year]  Age: 61 y.o.  MR#: [id number]       PCP:  Leonides Grills, MD, MD      Insurance: @PAYORNAME @   CC:    Chief Complaint  Patient presents with  . Appointment    arrythmia, syncope    VS BP 156/85  Pulse 83  Resp 16  Ht 5' 2"  (1.575 m)  Wt 123 lb (55.792 kg)  BMI 22.50 kg/m2  Weights Current Weight  12/06/11 123 lb (55.792 kg)  09/19/11 123 lb (55.792 kg)  08/13/11 128 lb (58.06 kg)    Blood Pressure  BP Readings from Last 3 Encounters:  12/06/11 156/85  12/01/11 140/58  09/19/11 155/76     Admit date:  (Not on file) Last encounter with RMR:  12/05/2011   Allergy Allergies  Allergen Reactions  . Atorvastatin   . Avandia (Rosiglitazone Maleate)   . Cephalexin   . Clindamycin/Lincomycin   . Codeine   . Erythromycin   . Iodinated Diagnostic Agents   . Metformin   . Penicillins   . Simvastatin     Current Outpatient Prescriptions  Medication Sig Dispense Refill  . ALPRAZolam (XANAX) 0.5 MG tablet Take 0.25 mg by mouth at bedtime.       Marland Kitchen aspirin 81 MG tablet Take 81 mg by mouth daily.        . Calcium Carbonate-Vitamin D (RA CALCIUM PLUS VITAMIN D) 600-400 MG-UNIT per tablet Take 1 tablet by mouth 2 (two) times daily.       . cholecalciferol (VITAMIN D) 1000 UNITS tablet Take 1,000 Units by mouth daily.        . Coenzyme Q10 (COQ10 PO) Take 1 capsule by mouth daily.      . Fe Fum-FePoly-FA-Vit C-Vit B3 (INTEGRA F) 125-1 MG CAPS Take 125 mg by mouth daily.  90 capsule  1  . fenofibrate (TRICOR) 145 MG tablet TAKE ONE (1) TABLET BY MOUTH EVERY      DAY  90 tablet  1  . furosemide (LASIX) 80 MG tablet       . insulin glulisine (APIDRA) 100 UNIT/ML injection Inject into the skin daily. Sliding scale (insulin pump)      . metoprolol tartrate (LOPRESSOR) 25 MG tablet TAKE ONE TABLET TWICE DAILY  180 tablet  1  . Multiple Vitamins-Minerals (MULTIVITAMIN WITH MINERALS) tablet Take 1 tablet by mouth daily.        .  nitroGLYCERIN (NITROLINGUAL) 0.4 MG/SPRAY spray Place 1 spray under the tongue every 5 (five) minutes as needed.        . OMEGA-3 KRILL OIL 300 MG CAPS Take 1 capsule by mouth daily.        Marland Kitchen omeprazole (PRILOSEC) 20 MG capsule Take 20 mg by mouth daily.       . potassium chloride SA (KLOR-CON M20) 20 MEQ tablet       . pravastatin (PRAVACHOL) 10 MG tablet Take 1 tablet (10 mg total) by mouth daily.  90 tablet  3  . sitaGLIPtan (JANUVIA) 100 MG tablet Take 100 mg by mouth daily.        Marland Kitchen ZETIA 10 MG tablet TAKE ONE TABLET DAILY AT BEDTIME  90 tablet  3  . DISCONTD: furosemide (LASIX) 40 MG tablet Take 40 mg by mouth as needed.        Marland Kitchen DISCONTD: furosemide (LASIX) 80 MG tablet TAKE ONE (1) TABLET BY MOUTH EVERY  DAY  60 tablet  6  . DISCONTD: KLOR-CON M20 20 MEQ tablet TAKE TWO TABLETS BY MOUTH DAILY  60 each  6    Discontinued Meds:    Medications Discontinued During This Encounter  Medication Reason  . furosemide (LASIX) 40 MG tablet Error  . furosemide (LASIX) 80 MG tablet   . KLOR-CON M20 20 MEQ tablet     Patient Active Problem List  Diagnoses  . Hyperlipidemia  . COLONIC POLYPS, HX OF  . Hypertension  . Small bowel obstruction  . CHF (congestive heart failure)  . Aortic stenosis  . Coronary artery disease  . Osteopenia  . Anemia  . NASH (nonalcoholic steatohepatitis)  . Carcinoma, renal cell  . Diabetes mellitus  . Duodenal ulcer  . Colitis, ischemic  . Gastroesophageal reflux disease  . Hodgkin's disease  . Cholelithiasis  . Fatigue  . Hypercalcemia  . Syncope    LABS Admission on 11/30/2011, Discharged on 12/01/2011  Component Date Value  . Sodium 11/30/2011 138   . Potassium 11/30/2011 3.9   . Chloride 11/30/2011 101   . CO2 11/30/2011 24   . Glucose, Bld 11/30/2011 203*  . BUN 11/30/2011 37*  . Creatinine, Ser 11/30/2011 1.01   . Calcium 11/30/2011 10.2   . GFR calc non Af Amer 11/30/2011 60*  . GFR calc Af Amer 11/30/2011 70*  . WBC 11/30/2011  10.4   . RBC 11/30/2011 4.27   . Hemoglobin 11/30/2011 11.8*  . HCT 11/30/2011 36.1   . MCV 11/30/2011 84.5   . [place] 11/30/2011 27.6   . MCHC 11/30/2011 32.7   . RDW 11/30/2011 15.3   . Platelets 11/30/2011 193   . Pro B Natriuretic peptid* 11/30/2011 326.6*  . Troponin i, poc 12/01/2011 0.00   . Comment 3 12/01/2011          Office Visit on 09/19/2011  Component Date Value  . Sodium 09/19/2011 141   . Potassium 09/19/2011 4.1   . Chloride 09/19/2011 102   . CO2 09/19/2011 24   . Glucose, Bld 09/19/2011 105*  . BUN 09/19/2011 39*  . Creat 09/19/2011 1.20*  . Calcium 09/19/2011 10.7*  Orders Only on 09/12/2011  Component Date Value  . Sodium 09/12/2011 142   . Potassium 09/12/2011 4.0   . Chloride 09/12/2011 103   . CO2 09/12/2011 27   . Glucose, Bld 09/12/2011 119*  . BUN 09/12/2011 33*  . Creat 09/12/2011 1.20*  . Total Bilirubin 09/12/2011 0.4   . Alkaline Phosphatase 09/12/2011 94   . AST 09/12/2011 37   . ALT 09/12/2011 34   . Total Protein 09/12/2011 8.1   . Albumin 09/12/2011 5.0   . Calcium 09/12/2011 10.6*  Orders Only on 09/12/2011  Component Date Value  . WBC 09/12/2011 10.8*  . RBC 09/12/2011 4.16   . Hemoglobin 09/12/2011 11.4*  . HCT 09/12/2011 37.5   . MCV 09/12/2011 90.1   . [facility] 09/12/2011 27.4   . MCHC 09/12/2011 30.4   . RDW 09/12/2011 15.0   . Platelets 09/12/2011 241      Results for this Opt Visit:     Results for orders placed during the hospital encounter of 46/65/99  BASIC METABOLIC PANEL      Component Value Range   Sodium 138  135 - 145 (mEq/L)   Potassium 3.9  3.5 - 5.1 (mEq/L)   Chloride 101  96 - 112 (mEq/L)   CO2 24  19 - 32 (mEq/L)   Glucose, Bld  203 (*) 70 - 99 (mg/dL)   BUN 37 (*) 6 - 23 (mg/dL)   Creatinine, Ser 1.01  0.50 - 1.10 (mg/dL)   Calcium 10.2  8.4 - 10.5 (mg/dL)   GFR calc non Af Amer 60 (*) >90 (mL/min)   GFR calc Af Amer 70 (*) >90 (mL/min)  CBC      Component Value Range   WBC 10.4  4.0 - 10.5 (K/uL)    RBC 4.27  3.87 - 5.11 (MIL/uL)   Hemoglobin 11.8 (*) 12.0 - 15.0 (g/dL)   HCT 36.1  36.0 - 46.0 (%)   MCV 84.5  78.0 - 100.0 (fL)   MCH 27.6  26.0 - 34.0 (pg)   MCHC 32.7  30.0 - 36.0 (g/dL)   RDW 15.3  11.5 - 15.5 (%)   Platelets 193  150 - 400 (K/uL)  PRO B NATRIURETIC PEPTIDE      Component Value Range   Pro B Natriuretic peptide (BNP) 326.6 (*) 0 - 125 (pg/mL)  POCT I-STAT TROPONIN I      Component Value Range   Troponin i, poc 0.00  0.00 - 0.08 (ng/mL)   Comment 3             EKG Orders placed during the hospital encounter of 11/30/11  . EKG 12-LEAD  . EKG 12-LEAD  . ED EKG  . ED EKG  . EKG 12-LEAD  . EKG 12-LEAD  . EKG     Prior Assessment and Plan Problem List as of 12/06/2011          Cardiology Problems   Hyperlipidemia   Last Assessment & Plan Note   09/19/2011 Office Visit Addendum 09/23/2011  8:02 AM by Yehuda Savannah, MD    Most recent lipid profile is suboptimal, but patient is intolerant of high-dose statin therapy.  I have suspected that coronary disease is related to radiation therapy rather than ASCVD.  She has had single vessel disease without apparent progression over the past 10 years.  I think it likely that she will not be troubled by coronary disease in the future regardless of the adequacy of control of hyperlipidemia.  Nonetheless, we will use alternative medication to lower her cholesterol to the extent possible.    Hypertension   Last Assessment & Plan Note   09/19/2011 Office Visit Signed 09/19/2011  4:40 PM by Yehuda Savannah, MD    Blood pressure control is good today without orthostatic decrease.    CHF (congestive heart failure)   Last Assessment & Plan Note   06/26/2011 Office Visit Addendum 06/30/2011 11:04 PM by Yehuda Savannah, MD    Patient's episodes of fluid retention have tended not to be associated with substantial abnormalities on physical examination.  She will continue to weigh daily and take additional furosemide for more than  a 3 pound weight gain until she has returned to her baseline.  If a 40 mg supplemental dose is not effective, she will take 80 mg.  She is planning a vacation cruise in [place] and will work with the ship's doctor to maintain stability while she is there.    Aortic stenosis   Last Assessment & Plan Note   06/26/2011 Office Visit Signed 06/26/2011 12:47 PM by Yehuda Savannah, MD    With a 19 mm pericardial valve, there is a significant likelihood of patient-valve mismatch.  There is no evidence of valve dysfunction by examination.    Coronary artery disease   Last Assessment &  Plan Note   09/19/2011 Office Visit Addendum 09/23/2011  8:00 AM by Yehuda Savannah, MD    Patient has remained stable with respect to coronary disease following surgical revascularization, I doubt that she will experience additional symptoms related to this problem in the foreseeable future.    Syncope     Other   COLONIC POLYPS, HX OF   Small bowel obstruction   Osteopenia   Last Assessment & Plan Note   09/19/2011 Office Visit Signed 09/19/2011  4:41 PM by Yehuda Savannah, MD    Osteopenia could be related to borderline hyperparathyroidism.  Bone densitometry was last performed 2 years ago.  A repeat study would be of interest.    Anemia   Last Assessment & Plan Note   09/19/2011 Office Visit Signed 09/19/2011  4:37 PM by Yehuda Savannah, MD    Anemia has improved with an increase in H&H to 11.4/37.5.    NASH (nonalcoholic steatohepatitis)   Last Assessment & Plan Note   09/19/2011 Office Visit Signed 09/19/2011  4:40 PM by Yehuda Savannah, MD    LFTs have been minimally abnormal on recent determinations, but have returned to normal as of testing in 08/2011.    Carcinoma, renal cell   Diabetes mellitus   Duodenal ulcer   Colitis, ischemic   Gastroesophageal reflux disease   Hodgkin's disease   Cholelithiasis   Fatigue   Last Assessment & Plan Note   08/13/2011 Office Visit Addendum 08/15/2011 11:03 PM by  Yehuda Savannah, MD    No apparent reason for patient's recent deterioration.  TSH was normal earlier this month.  CBC shows stable mild chronic anemia at a level that would not be expected to cause the symptoms.  This would be an unusual reaction to an ACE inhibitor, but since renal function has deteriorated, that medication will be stopped.  She has also developed more significant hypercalcemia with a recent level of 11.1.  This apparently is due to deterioration in renal dysfunction perhaps related to ACE inhibitor.  A value will be rechecked in one month.    Hypercalcemia   Last Assessment & Plan Note   09/19/2011 Office Visit Addendum 09/23/2011  8:01 AM by Yehuda Savannah, MD    Calcium level has decreased from a peak of 11.1 to 10.6.  Dr. Eden Emms has reviewed laboratory studies and advised patient that there is inadequate evidence for true hyperparathyroidism.  She does appear to have at least a borderline abnormality with a low normal PTH level.  Borderline hypercalcemia was likely exacerbated by administration of vitamin D and calcium plus a degree of acute renal failure associated with ACE inhibitor.        Imaging: Dg Chest 2 View  11/30/2011  *RADIOLOGY REPORT*  Clinical Data: Chest pain.  Loss of consciousness.  CHEST - 2 VIEW  Comparison: PA and lateral chest 07/11/2011.  Findings: The patient is status post CABG and aortic valve replacement.  Coronary artery stent is noted.  Heart size is normal.  Lungs are clear.  No pneumothorax or effusion.  IMPRESSION: No acute finding.  Original Report Authenticated By: Arvid Right. Luther Parody, M.D.   Ct Head Wo Contrast  12/01/2011  *RADIOLOGY REPORT*  Clinical Data: Dizziness and loss of consciousness.  CT HEAD WITHOUT CONTRAST  Technique:  Contiguous axial images were obtained from the base of the skull through the vertex without contrast.  Comparison: None.  Findings: Mild diffuse cerebral atrophy.  Mild ventricular dilatation consistent with  central atrophy.  No mass effect or midline shift.  No abnormal extra-axial fluid collections.  Pearline Cables- white matter junctions are distinct.  Basal cisterns are not effaced.  No evidence of acute intracranial hemorrhage.  No depressed skull fractures.  Visualized paranasal sinuses and mastoid air cells are not opacified.  Vascular calcifications.  IMPRESSION: No acute intracranial abnormalities.  Original Report Authenticated By: Neale Burly, M.D.     [facility] Calculation: Score not calculated. Missing: Total Cholesterol

## 2011-12-06 NOTE — Patient Instructions (Addendum)
Your physician recommends that you schedule a follow-up appointment in: [place] physician recommends that you return for lab work in: TODAY  Scheduled for a loop recorder on 5/20 with Dr Lovena Le

## 2011-12-06 NOTE — Assessment & Plan Note (Addendum)
Patient required a small prosthetic valve and likely has a component of valve-patient mismatch.  Valve sounds are crisp without evidence for thrombosis or dysfunction.

## 2011-12-09 MED ORDER — VANCOMYCIN HCL IN DEXTROSE 1-5 GM/200ML-% IV SOLN
1000.0000 mg | INTRAVENOUS | Status: DC
  Filled 2011-12-09: qty 200

## 2011-12-09 MED ORDER — GENTAMICIN SULFATE 40 MG/ML IJ SOLN
80.0000 mg | INTRAMUSCULAR | Status: DC
  Filled 2011-12-09: qty 2

## 2011-12-10 ENCOUNTER — Ambulatory Visit (HOSPITAL_COMMUNITY)
Admission: RE | Admit: 2011-12-10 | Discharge: 2011-12-10 | Disposition: A | Discharge status: Home / Self Care | Payer: BC Managed Care – PPO | Source: Ambulatory Visit | Attending: Internal Medicine | Admitting: Internal Medicine

## 2011-12-10 ENCOUNTER — Encounter (HOSPITAL_COMMUNITY)
Admission: RE | Disposition: A | Discharge status: Home / Self Care | Payer: Self-pay | Source: Ambulatory Visit | Attending: Internal Medicine

## 2011-12-10 DIAGNOSIS — Z87898 Personal history of other specified conditions: Secondary | ICD-10-CM

## 2011-12-10 DIAGNOSIS — I359 Nonrheumatic aortic valve disorder, unspecified: Secondary | ICD-10-CM | POA: Insufficient documentation

## 2011-12-10 DIAGNOSIS — R55 Syncope and collapse: Secondary | ICD-10-CM

## 2011-12-10 DIAGNOSIS — I10 Essential (primary) hypertension: Secondary | ICD-10-CM

## 2011-12-10 DIAGNOSIS — I509 Heart failure, unspecified: Secondary | ICD-10-CM

## 2011-12-10 DIAGNOSIS — Z01818 Encounter for other preprocedural examination: Secondary | ICD-10-CM

## 2011-12-10 DIAGNOSIS — D649 Anemia, unspecified: Secondary | ICD-10-CM

## 2011-12-10 DIAGNOSIS — Z85528 Personal history of other malignant neoplasm of kidney: Secondary | ICD-10-CM

## 2011-12-10 DIAGNOSIS — I2510 Atherosclerotic heart disease of native coronary artery without angina pectoris: Secondary | ICD-10-CM | POA: Insufficient documentation

## 2011-12-10 DIAGNOSIS — I350 Nonrheumatic aortic (valve) stenosis: Secondary | ICD-10-CM

## 2011-12-10 HISTORY — PX: LOOP RECORDER IMPLANT: SHX5477

## 2011-12-10 LAB — GLUCOSE, CAPILLARY
Glucose-Capillary: 86 mg/dL (ref 70–99)
Glucose-Capillary: 81 mg/dL (ref 70–99)

## 2011-12-10 LAB — SURGICAL PCR SCREEN
MRSA, PCR: NEGATIVE
Staphylococcus aureus: NEGATIVE

## 2011-12-10 SURGERY — LOOP RECORDER IMPLANT
Anesthesia: LOCAL

## 2011-12-10 MED ORDER — ONDANSETRON HCL 4 MG/2ML IJ SOLN: 4.0000 mg | Freq: Four times a day (QID) | INTRAMUSCULAR | Status: DC | PRN

## 2011-12-10 MED ORDER — LIDOCAINE HCL (PF) 1 % IJ SOLN
INTRAMUSCULAR | Status: AC
  Filled 2011-12-10: qty 60

## 2011-12-10 MED ORDER — SODIUM CHLORIDE 0.9 % IJ SOLN
3.0000 mL | INTRAMUSCULAR | Status: DC | PRN
Start: 2011-12-10 — End: 2011-12-10

## 2011-12-10 MED ORDER — SODIUM CHLORIDE 0.9 % IV SOLN: 250.0000 mL | INTRAVENOUS | Status: DC

## 2011-12-10 MED ORDER — SODIUM CHLORIDE 0.45 % IV SOLN
INTRAVENOUS | Status: DC
  Administered 2011-12-10: 14:00:00 via INTRAVENOUS

## 2011-12-10 MED ORDER — ACETAMINOPHEN 325 MG PO TABS
325.0000 mg | ORAL_TABLET | ORAL | Status: DC | PRN
  Filled 2011-12-10: qty 2

## 2011-12-10 MED ORDER — MUPIROCIN 2 % EX OINT
TOPICAL_OINTMENT | CUTANEOUS | Status: AC
  Filled 2011-12-10: qty 22

## 2011-12-10 MED ORDER — MUPIROCIN 2 % EX OINT
TOPICAL_OINTMENT | Freq: Two times a day (BID) | CUTANEOUS | Status: DC
  Administered 2011-12-10: 14:00:00 via NASAL
  Filled 2011-12-10: qty 22

## 2011-12-10 MED ORDER — FENTANYL CITRATE 0.05 MG/ML IJ SOLN
INTRAMUSCULAR | Status: AC
  Filled 2011-12-10: qty 2

## 2011-12-10 MED ORDER — SODIUM CHLORIDE 0.9 % IJ SOLN: 3.0000 mL | Freq: Two times a day (BID) | INTRAMUSCULAR | Status: DC

## 2011-12-10 MED ORDER — VANCOMYCIN HCL IN DEXTROSE 1-5 GM/200ML-% IV SOLN
INTRAVENOUS | Status: AC
  Filled 2011-12-10: qty 200

## 2011-12-10 MED ORDER — MIDAZOLAM HCL 5 MG/5ML IJ SOLN
INTRAMUSCULAR | Status: AC
  Filled 2011-12-10: qty 5

## 2011-12-10 NOTE — Op Note (Signed)
NAME:  [name] [name], [name] [name]             ACCOUNT NO.:  [id number]  MEDICAL RECORD NO.:  [id number]  LOCATION:  [facility]                         FACILITY:  [facility]  PHYSICIAN:  Champ Mungo. Lovena Le, MD    DATE OF BIRTH:  20-Feb-1951  DATE OF PROCEDURE:  12/10/2011 DATE OF DISCHARGE:                              OPERATIVE REPORT   PROCEDURE PERFORMED:  Insertion of implantable loop recorder.  INDICATION:  Unexplained recurrent syncope.  INTRODUCTION:  The patient is a 60 year old woman with extensive past medical history, including coronary disease, status post bypass surgery, and aortic stenosis.  She has had 5 episodes of recurrent syncopal episodes.  These were categorized as having had the patient fall out. She loses consciousness for only a few seconds.  The spells last for brief periods of time.  There were associated palpitations.  She wore a cardiac monitor, which demonstrated no arrhythmias.  She has preserved left ventricular function.  She is now referred for insertion of implantable loop recorder to help with diagnosis of her unexplained syncope.  PROCEDURE:  After informed consent was obtained, the patient was taken to the diagnostic EP lab in a fasting state.  After usual preparation and draping, intravenous fentanyl and midazolam were given for sedation. A 30 mL of lidocaine was infiltrated into the left pectoral region.  A 3- cm incision was carried out over this region and electrocautery was utilized to dissect down to the fascial plane.  A subcutaneous pocket was then fashioned with electrocautery.  Antibiotic irrigation was utilized to irrigate the pocket and electrocautery was utilized to assure hemostasis.  The Medtronic Reveal implantable loop recorder was then placed in the subcutaneous pocket and secured with silk suture to the fascial plane.  The pocket was irrigated with antibiotic irrigation and the incision was closed with 2-0 and 3-0 Vicryl.  Benzoin and Steri- Strips  were painted on the skin.  A pressure dressing was applied.  The patient was returned to her room in satisfactory condition.  COMPLICATIONS:  There were no immediate procedure complications.  RESULTS:  This demonstrates successful insertion of a Medtronic implantable loop recorder in a patient with unexplained recurrent episodes of syncope.     Champ Mungo. Lovena Le, MD     GWT/MEDQ  D:  12/10/2011  T:  12/10/2011  Job:  [id number]  cc:   Cristopher Estimable. Lattie Haw, MD, [facility]

## 2011-12-10 NOTE — H&P (View-Only) (Signed)
Patient ID: [name] [name], female   DOB: 1950-10-20, 61 y.o.   MRN: [id number]  HPI: Patient seen by referral from the emergency department where she was evaluated a few days ago for palpitations associated with near syncope.  This nice woman has had similar symptoms on 3 occasions over the past 4-5 months, but Friday's spell was more prolonged and severe.  Symptoms persisted for approximately 20 minutes, but resolved prior to the arrival of EMS.  Rhythm was normal sinus when she was first seen.  She was transported to the emergency department where mild elevation in BUN was felt to represent prerenal azotemia, but her symptoms were attributed to a neurocardiogenic mechanism.  She had a recurrent spell following discharge from the ED lasting a matter of minutes.  She has felt fine since, but with some malaise, fatigue and exertion intolerance.  Prior to Admission medications   Medication Sig Start Date End Date Taking? Authorizing Provider  ALPRAZolam [name] [name]) 0.5 MG tablet Take 0.25 mg by mouth at bedtime.    Yes Historical Provider, MD  aspirin 81 MG tablet Take 81 mg by mouth daily.     Yes Historical Provider, MD  Calcium Carbonate-Vitamin D (RA CALCIUM PLUS VITAMIN D) 600-400 MG-UNIT per tablet Take 1 tablet by mouth 2 (two) times daily.    Yes Historical Provider, MD  cholecalciferol (VITAMIN D) 1000 UNITS tablet Take 1,000 Units by mouth daily.     Yes Historical Provider, MD  Coenzyme Q10 (COQ10 PO) Take 1 capsule by mouth daily.   Yes Historical Provider, MD  Fe Fum-FePoly-FA-Vit C-Vit B3 (INTEGRA F) 125-1 MG CAPS Take 125 mg by mouth daily. 10/15/11  Yes [name] Marion Downer, MD  fenofibrate (TRICOR) 145 MG tablet TAKE ONE (1) TABLET BY MOUTH EVERY      DAY 10/30/11  Yes [name] Savannah, MD  furosemide (LASIX) 80 MG tablet  11/23/11  Yes [name] Savannah, MD  insulin glulisine (APIDRA) 100 UNIT/ML injection Inject into the skin daily. Sliding scale (insulin pump)   Yes Historical Provider, MD    metoprolol tartrate (LOPRESSOR) 25 MG tablet TAKE ONE TABLET TWICE DAILY 10/30/11  Yes [name] Savannah, MD  Multiple Vitamins-Minerals (MULTIVITAMIN WITH MINERALS) tablet Take 1 tablet by mouth daily.     Yes Historical Provider, MD  nitroGLYCERIN (NITROLINGUAL) 0.4 MG/SPRAY spray Place 1 spray under the tongue every 5 (five) minutes as needed.     Yes Historical Provider, MD  OMEGA-3 KRILL OIL 300 MG CAPS Take 1 capsule by mouth daily.     Yes Historical Provider, MD  omeprazole (PRILOSEC) 20 MG capsule Take 20 mg by mouth daily.    Yes Historical Provider, MD  potassium chloride SA (KLOR-CON M20) 20 MEQ tablet  11/23/11  Yes [name] Savannah, MD  pravastatin (PRAVACHOL) 10 MG tablet Take 1 tablet (10 mg total) by mouth daily. 06/04/11  Yes [name] Savannah, MD  sitaGLIPtan (JANUVIA) 100 MG tablet Take 100 mg by mouth daily.     Yes Historical Provider, MD  ZETIA 10 MG tablet TAKE ONE TABLET DAILY AT BEDTIME 01/30/11  Yes [name] Savannah, MD    Allergies  Allergen Reactions  . Atorvastatin   . Avandia (Rosiglitazone Maleate)   . Cephalexin   . Clindamycin/Lincomycin   . Codeine   . Erythromycin   . Iodinated Diagnostic Agents   . Metformin   . Penicillins   . Simvastatin     Past medical history, social history, and family  history reviewed and updated.  ROS: Denies chest discomfort, orthostatic lightheadedness, orthopnea, PND or peripheral edema.  Control of diabetes has been suboptimal of late.  Her ability to manage diet and exercise has been overwhelmed by work demands.  PHYSICAL EXAM: BP 156/85  Pulse 83  Resp 16  Ht 5' 2"  (1.575 m)  Wt 55.792 kg (123 lb)  BMI 22.50 kg/m2  General-Well developed; no acute distress Body habitus-Thin Neck-No JVD; no carotid bruits-minimal transmitted murmur Lungs-Coarse bibasilar breath sounds with minimal rales; resonant to percussion; mild kyphosis; median sternotomy scar x2 Cardiovascular-normal PMI; normal S1; marked increase in  intensity of S2; grade 2/6 basilar systolic ejection murmur Abdomen-normal bowel sounds; soft and non-tender without masses or organomegaly; insulin pump carried on the belt Musculoskeletal-No deformities, no cyanosis or clubbing Neurologic-Normal cranial nerves; symmetric strength and tone Skin-Warm, no significant lesions Extremities-distal pulses intact; no edema  Rhythm Strip: Normal sinus rhythm, first degree AV block with a PR interval of 280-290 ms.  ASSESSMENT AND PLAN:  [name] Ducking, MD 12/06/2011 12:44 PM

## 2011-12-10 NOTE — Interval H&P Note (Signed)
History and Physical Interval Note:since the patient was seen by Dr. Lattie Haw, no change in the history, physical exam, assessment and plan.  12/10/2011 4:10 PM  [name] [name]  has presented today for surgery, with the diagnosis of Syncope  The various methods of treatment have been discussed with the patient and family. After consideration of risks, benefits and other options for treatment, the patient has consented to  Procedure(s) (LRB): LOOP RECORDER IMPLANT (N/A) as a surgical intervention .  The patients' history has been reviewed, patient examined, no change in status, stable for surgery.  I have reviewed the patients' chart and labs.  Questions were answered to the patient's satisfaction.     [name] [name]

## 2011-12-10 NOTE — Op Note (Signed)
ILR insertion via the left subclavian vein without immediate complication. T#[id number].

## 2011-12-10 NOTE — Discharge Instructions (Signed)
REMOVE OUTER DRESSING TOMORROW AND LEAVE [facility]ER[facility] INTACT; DO NOT GET SITE WET FOR 1 WEEK; CALL [phone] IF ANY BLEEDING,DRAINAGE,FEVER,PAIN,SWELLING,OR REDNESS AT SITE; CALL IF ANY PROBLEMS,QUESTIONS, OR CONCERNS; NO HEAVY LIFTING

## 2011-12-11 ENCOUNTER — Telehealth: Payer: Self-pay | Admitting: Internal Medicine

## 2011-12-11 NOTE — Telephone Encounter (Signed)
Called patient and she has been in more pain than she expected.  She took medication for HA and it feels better.  I have advised her to take some OTC Tylenol ao Ibuprofen to help with the pain

## 2011-12-11 NOTE — Telephone Encounter (Signed)
New msg Pt had loop recorder procedure yesterday and she said it has not stopped hurting. Please call

## 2011-12-13 ENCOUNTER — Telehealth: Payer: Self-pay | Admitting: *Deleted

## 2011-12-13 NOTE — Telephone Encounter (Signed)
Received a phone call from patient, stating that she developed a rash two days ago that was associated with severe itching.  Brought into office for assessment.  States that she has treated it with benadryl and Cetaphil and has noticed marked improvement.  Rash is fine, in nature, and extends from upper left breast to jaw line.  Advised her to continue current treatment and call me back if she doesn't see improvement or if she develops other symptoms.  Gentamycin was used for irrigation during procedure.  Unsure if this may be the cause, as she has had similar reactions in the past with multiple other antibiotics.

## 2011-12-21 ENCOUNTER — Encounter: Payer: Self-pay | Admitting: Cardiology

## 2011-12-21 ENCOUNTER — Ambulatory Visit: Payer: BC Managed Care – PPO | Admitting: Cardiology

## 2011-12-21 ENCOUNTER — Ambulatory Visit (INDEPENDENT_AMBULATORY_CARE_PROVIDER_SITE_OTHER): Payer: BC Managed Care – PPO | Admitting: Cardiology

## 2011-12-21 VITALS — BP 139/71 | HR 82 | Ht 62.0 in | Wt 127.0 lb

## 2011-12-21 DIAGNOSIS — I10 Essential (primary) hypertension: Secondary | ICD-10-CM

## 2011-12-21 DIAGNOSIS — R55 Syncope and collapse: Secondary | ICD-10-CM

## 2011-12-21 DIAGNOSIS — I509 Heart failure, unspecified: Secondary | ICD-10-CM

## 2011-12-21 NOTE — Progress Notes (Deleted)
Name: ROGER FASNACHT    DOB: 06/18/1951  Age: 61 y.o.  MR#: [id number]       PCP:  Leonides Grills, MD, MD      Insurance: @PAYORNAME @   CC:    Chief Complaint  Patient presents with  . 4 pound weight gain, pedal edema, resolved rash to left uppe    2 week follow up - Med list/TC    VS BP 142/70  Pulse 77  Ht 5' 2"  (1.575 m)  Wt 127 lb (57.607 kg)  BMI 23.23 kg/m2  SpO2 98%  Weights Current Weight  12/21/11 127 lb (57.607 kg)  12/10/11 124 lb (56.246 kg)  12/10/11 124 lb (56.246 kg)    Blood Pressure  BP Readings from Last 3 Encounters:  12/21/11 142/70  12/10/11 148/68  12/10/11 148/68     Admit date:  (Not on file) Last encounter with [facility]:  12/06/2011   Allergy Allergies  Allergen Reactions  . Gentamycin (Gentamicin) Hives and Itching    Reaction noted post loop recorder implant, where gentamycin was used for irrigation.  . Atorvastatin   . Avandia (Rosiglitazone Maleate)   . Cephalexin   . Clindamycin/Lincomycin   . Codeine   . Erythromycin   . Iodinated Diagnostic Agents   . Metformin   . Penicillins   . Simvastatin     Current Outpatient Prescriptions  Medication Sig Dispense Refill  . ALPRAZolam (XANAX) 0.5 MG tablet Take 0.25 mg by mouth at bedtime.       Marland Kitchen aspirin 81 MG tablet Take 81 mg by mouth daily.        . Calcium Carbonate-Vitamin D (RA CALCIUM PLUS VITAMIN D) 600-400 MG-UNIT per tablet Take 1 tablet by mouth 2 (two) times daily.       . cholecalciferol (VITAMIN D) 1000 UNITS tablet Take 1,000 Units by mouth daily.        . Coenzyme Q10 (COQ10 PO) Take 1 capsule by mouth daily.      . Fe Fum-FePoly-FA-Vit C-Vit B3 (INTEGRA F) 125-1 MG CAPS Take 125 mg by mouth daily.  90 capsule  1  . fenofibrate (TRICOR) 145 MG tablet TAKE ONE (1) TABLET BY MOUTH EVERY      DAY  90 tablet  1  . furosemide (LASIX) 80 MG tablet       . insulin glulisine (APIDRA) 100 UNIT/ML injection Inject into the skin daily. Sliding scale (insulin pump)      .  metoprolol tartrate (LOPRESSOR) 25 MG tablet TAKE ONE TABLET TWICE DAILY  180 tablet  1  . Multiple Vitamins-Minerals (MULTIVITAMIN WITH MINERALS) tablet Take 1 tablet by mouth daily.        . nitroGLYCERIN (NITROLINGUAL) 0.4 MG/SPRAY spray Place 1 spray under the tongue every 5 (five) minutes as needed.        . OMEGA-3 KRILL OIL 300 MG CAPS Take 1 capsule by mouth daily.        Marland Kitchen omeprazole (PRILOSEC) 20 MG capsule Take 20 mg by mouth daily.       . potassium chloride SA (KLOR-CON M20) 20 MEQ tablet       . pravastatin (PRAVACHOL) 10 MG tablet Take 1 tablet (10 mg total) by mouth daily.  90 tablet  3  . sitaGLIPtan (JANUVIA) 100 MG tablet Take 100 mg by mouth daily.        Marland Kitchen ZETIA 10 MG tablet TAKE ONE TABLET DAILY AT BEDTIME  90 tablet  3    Discontinued  Meds:   There are no discontinued medications.  Patient Active Problem List  Diagnoses  . Hyperlipidemia  . COLONIC POLYPS, HX OF  . Hypertension  . Small bowel obstruction  . CHF (congestive heart failure)  . Aortic stenosis  . Coronary artery disease  . Osteopenia  . Anemia  . NASH (nonalcoholic steatohepatitis)  . Carcinoma, renal cell  . Diabetes mellitus  . Duodenal ulcer  . Colitis, ischemic  . Gastroesophageal reflux disease  . Hodgkin's disease  . Cholelithiasis  . Fatigue  . Hypercalcemia  . Near syncope    LABS Admission on 12/10/2011, Discharged on 12/10/2011  Component Date Value  . Glucose-Capillary 12/10/2011 86   . MRSA, PCR 12/10/2011 NEGATIVE   . Staphylococcus aureus 12/10/2011 NEGATIVE   . Glucose-Capillary 12/10/2011 81   . Glucose-Capillary 12/10/2011 124*  Office Visit on 12/06/2011  Component Date Value  . aPTT 12/06/2011 35   . Prothrombin Time 12/06/2011 14.0   . INR 12/06/2011 1.04   Admission on 11/30/2011, Discharged on 12/01/2011  Component Date Value  . Sodium 11/30/2011 138   . Potassium 11/30/2011 3.9   . Chloride 11/30/2011 101   . CO2 11/30/2011 24   . Glucose, Bld  11/30/2011 203*  . BUN 11/30/2011 37*  . Creatinine, Ser 11/30/2011 1.01   . Calcium 11/30/2011 10.2   . GFR calc non Af Amer 11/30/2011 60*  . GFR calc Af Amer 11/30/2011 70*  . WBC 11/30/2011 10.4   . RBC 11/30/2011 4.27   . Hemoglobin 11/30/2011 11.8*  . HCT 11/30/2011 36.1   . MCV 11/30/2011 84.5   . Winner 11/30/2011 27.6   . MCHC 11/30/2011 32.7   . RDW 11/30/2011 15.3   . Platelets 11/30/2011 193   . Pro B Natriuretic peptid* 11/30/2011 326.6*  . Troponin i, poc 12/01/2011 0.00   . Comment 3 12/01/2011             Results for this Opt Visit:     Results for orders placed during the hospital encounter of 12/10/11  GLUCOSE, CAPILLARY      Component Value Range   Glucose-Capillary 86  70 - 99 (mg/dL)  SURGICAL PCR SCREEN      Component Value Range   MRSA, PCR NEGATIVE  NEGATIVE    Staphylococcus aureus NEGATIVE  NEGATIVE   GLUCOSE, CAPILLARY      Component Value Range   Glucose-Capillary 81  70 - 99 (mg/dL)  GLUCOSE, CAPILLARY      Component Value Range   Glucose-Capillary 124 (*) 70 - 99 (mg/dL)    EKG Orders placed during the hospital encounter of 11/30/11  . EKG 12-LEAD  . EKG 12-LEAD  . ED EKG  . ED EKG  . EKG 12-LEAD  . EKG 12-LEAD  . EKG     Prior Assessment and Plan Problem List as of 12/21/2011          Cardiology Problems   Hyperlipidemia   Last Assessment & Plan Note   09/19/2011 Office Visit Addendum 09/23/2011  8:02 AM by Yehuda Savannah, MD    Most recent lipid profile is suboptimal, but patient is intolerant of high-dose statin therapy.  I have suspected that coronary disease is related to radiation therapy rather than ASCVD.  She has had single vessel disease without apparent progression over the past 10 years.  I think it likely that she will not be troubled by coronary disease in the future regardless of the adequacy of control of hyperlipidemia.  Nonetheless, we will use alternative medication to lower her cholesterol to the extent possible.     Hypertension   Last Assessment & Plan Note   12/06/2011 Office Visit Signed 12/06/2011 12:53 PM by Yehuda Savannah, MD    Blood pressure is slightly elevated in the office, but multiple determinations at home have been excellent.  There is no orthostatic change to suggest hypotension as a cause for near-syncope.    CHF (congestive heart failure)   Last Assessment & Plan Note   12/06/2011 Office Visit Addendum 12/06/2011 12:52 PM by Yehuda Savannah, MD    Remains well compensated with stable weight and symptoms.  Despite a mild elevation in BUN, current dose of furosemide will be continued, as she has required mild prerenal azotemia to maintain compensation of CHF.    Aortic stenosis   Last Assessment & Plan Note   12/06/2011 Office Visit Addendum 12/09/2011 10:45 PM by Yehuda Savannah, MD    Patient required a small prosthetic valve and likely has a component of valve-patient mismatch.  Valve sounds are crisp without evidence for thrombosis or dysfunction.    Coronary artery disease   Last Assessment & Plan Note   09/19/2011 Office Visit Addendum 09/23/2011  8:00 AM by Yehuda Savannah, MD    Patient has remained stable with respect to coronary disease following surgical revascularization, I doubt that she will experience additional symptoms related to this problem in the foreseeable future.    Near syncope   Last Assessment & Plan Note   12/06/2011 Office Visit Signed 12/06/2011 12:55 PM by Yehuda Savannah, MD    History is highly suggestive of an arrhythmia, most likely a tachyarrhythmia, causing cerebral hypoperfusion.  Patient has carried an event recorder for 3 weeks without documentation of an arrhythmia; however, no symptomatic spells occurred during this interval.  Case discussed with Dr. Lovena Le, who agrees that an implanted loop recorder is appropriate at this point.  Risks and benefits were described including device infection.  She agrees to proceed.      Other   COLONIC  POLYPS, HX OF   Small bowel obstruction   Osteopenia   Last Assessment & Plan Note   09/19/2011 Office Visit Signed 09/19/2011  4:41 PM by Yehuda Savannah, MD    Osteopenia could be related to borderline hyperparathyroidism.  Bone densitometry was last performed 2 years ago.  A repeat study would be of interest.    Anemia   Last Assessment & Plan Note   12/06/2011 Office Visit Signed 12/06/2011 12:51 PM by Yehuda Savannah, MD    Anemia is minimal at this point without a clear etiology.    NASH (nonalcoholic steatohepatitis)   Last Assessment & Plan Note   09/19/2011 Office Visit Signed 09/19/2011  4:40 PM by Yehuda Savannah, MD    LFTs have been minimally abnormal on recent determinations, but have returned to normal as of testing in 08/2011.    Carcinoma, renal cell   Diabetes mellitus   Duodenal ulcer   Colitis, ischemic   Gastroesophageal reflux disease   Hodgkin's disease   Cholelithiasis   Fatigue   Last Assessment & Plan Note   08/13/2011 Office Visit Addendum 08/15/2011 11:03 PM by Yehuda Savannah, MD    No apparent reason for patient's recent deterioration.  TSH was normal earlier this month.  CBC shows stable mild chronic anemia at a level that would not be expected to cause the symptoms.  This would be  an unusual reaction to an ACE inhibitor, but since renal function has deteriorated, that medication will be stopped.  She has also developed more significant hypercalcemia with a recent level of 11.1.  This apparently is due to deterioration in renal dysfunction perhaps related to ACE inhibitor.  A value will be rechecked in one month.    Hypercalcemia   Last Assessment & Plan Note   09/19/2011 Office Visit Addendum 09/23/2011  8:01 AM by Yehuda Savannah, MD    Calcium level has decreased from a peak of 11.1 to 10.6.  Dr. Eden Emms has reviewed laboratory studies and advised patient that there is inadequate evidence for true hyperparathyroidism.  She does appear to have at least  a borderline abnormality with a low normal PTH level.  Borderline hypercalcemia was likely exacerbated by administration of vitamin D and calcium plus a degree of acute renal failure associated with ACE inhibitor.        Imaging: Dg Chest 2 View  11/30/2011  *RADIOLOGY REPORT*  Clinical Data: Chest pain.  Loss of consciousness.  CHEST - 2 VIEW  Comparison: PA and lateral chest 07/11/2011.  Findings: The patient is status post CABG and aortic valve replacement.  Coronary artery stent is noted.  Heart size is normal.  Lungs are clear.  No pneumothorax or effusion.  IMPRESSION: No acute finding.  Original Report Authenticated By: Arvid Right. Luther Parody, M.D.   Ct Head Wo Contrast  12/01/2011  *RADIOLOGY REPORT*  Clinical Data: Dizziness and loss of consciousness.  CT HEAD WITHOUT CONTRAST  Technique:  Contiguous axial images were obtained from the base of the skull through the vertex without contrast.  Comparison: None.  Findings: Mild diffuse cerebral atrophy.  Mild ventricular dilatation consistent with central atrophy.  No mass effect or midline shift.  No abnormal extra-axial fluid collections.  Pearline Cables- white matter junctions are distinct.  Basal cisterns are not effaced.  No evidence of acute intracranial hemorrhage.  No depressed skull fractures.  Visualized paranasal sinuses and mastoid air cells are not opacified.  Vascular calcifications.  IMPRESSION: No acute intracranial abnormalities.  Original Report Authenticated By: Neale Burly, M.D.     [facility] Calculation: Score not calculated. Missing: Total Cholesterol

## 2011-12-21 NOTE — Assessment & Plan Note (Addendum)
Although she activated the event recorder on one occasion, this was not for a spell similar to 3 that caused loss of consciousness or near loss of consciousness.  She was encouraged to continue to activate the event recorder for any symptoms and will return to see Dr. Lovena Le at which time will she will be provided with equipment to allow her to transmit events via telephone.

## 2011-12-21 NOTE — Assessment & Plan Note (Signed)
Patient appears well compensated by exam and has not experienced increasing dyspnea or decreased exercise tolerance even though her weight is slightly up.  She will continue to take an extra 40 mg of furosemide per day until weight returns to baseline.  We will closely monitor electrolytes and renal function.

## 2011-12-21 NOTE — Assessment & Plan Note (Signed)
Systolics all below 030 this month and diastolics below 80 representing adequate control of hypertension.  There was no orthostatic change when measured at this visit.  Her symptoms of lightheadedness are not clearly related to hypotension.

## 2011-12-21 NOTE — Patient Instructions (Signed)
Your physician recommends that you schedule a follow-up appointment in:  1 - Next available with Nevin Bloodgood for Loop Recorder interrogation 2 - 2 months with Dr Lattie Haw 3 - 3 months from implant date 5/20 with Dr Lovena Le  Your physician recommends that you return for lab work in:  1 - On or around June 14 th 2 - On or around July 12 th

## 2011-12-21 NOTE — Progress Notes (Signed)
Patient ID: [name] [name], female   DOB: 1951/04/07, 61 y.o.   MRN: [id number]  HPI: Scheduled return visit for this long-suffering patient with multiple medical and cardiologic problems.  Since I saw her 2 weeks ago, she has undergone implantation of a loop recorder.  She has not experienced recurrent palpitations, but activated the recorder on one occasion when she experienced dizziness.  Dyspnea has been well controlled.  She has noted no edema.  She has recorded a 3-4 pound weight gain at home for which she increased furosemide to a total of 120 mg per day as previously instructed.  This has stabilized her weight, but not returned her to baseline, which is 120 pounds at home.  She developed an urticarial rash over the left chest following the procedure, which has now resolved, and was apparently due to topical solution applied during the procedure, either during skin preparation or when the pocket was irrigated.  Prior to Admission medications   Medication Sig Start Date End Date Taking? Authorizing Provider  ALPRAZolam Duanne Moron) 0.5 MG tablet Take 0.25 mg by mouth at bedtime.    Yes Historical Provider, MD  aspirin 81 MG tablet Take 81 mg by mouth daily.     Yes Historical Provider, MD  Calcium Carbonate-Vitamin D (RA CALCIUM PLUS VITAMIN D) 600-400 MG-UNIT per tablet Take 1 tablet by mouth 2 (two) times daily.    Yes Historical Provider, MD  cholecalciferol (VITAMIN D) 1000 UNITS tablet Take 1,000 Units by mouth daily.     Yes Historical Provider, MD  Coenzyme Q10 (COQ10 PO) Take 1 capsule by mouth daily.   Yes Historical Provider, MD  Fe Fum-FePoly-FA-Vit C-Vit B3 (INTEGRA F) 125-1 MG CAPS Take 125 mg by mouth daily. 10/15/11  Yes Lennis Marion Downer, MD  fenofibrate (TRICOR) 145 MG tablet TAKE ONE (1) TABLET BY MOUTH EVERY      DAY 10/30/11  Yes Yehuda Savannah, MD  furosemide (LASIX) 80 MG tablet  11/23/11  Yes Yehuda Savannah, MD  insulin glulisine (APIDRA) 100 UNIT/ML injection Inject into the  skin daily. Sliding scale (insulin pump)   Yes Historical Provider, MD  metoprolol tartrate (LOPRESSOR) 25 MG tablet TAKE ONE TABLET TWICE DAILY 10/30/11  Yes Yehuda Savannah, MD  Multiple Vitamins-Minerals (MULTIVITAMIN WITH MINERALS) tablet Take 1 tablet by mouth daily.     Yes Historical Provider, MD  nitroGLYCERIN (NITROLINGUAL) 0.4 MG/SPRAY spray Place 1 spray under the tongue every 5 (five) minutes as needed.     Yes Historical Provider, MD  OMEGA-3 KRILL OIL 300 MG CAPS Take 1 capsule by mouth daily.     Yes Historical Provider, MD  omeprazole (PRILOSEC) 20 MG capsule Take 20 mg by mouth daily.    Yes Historical Provider, MD  potassium chloride SA (KLOR-CON M20) 20 MEQ tablet  11/23/11  Yes Yehuda Savannah, MD  pravastatin (PRAVACHOL) 10 MG tablet Take 1 tablet (10 mg total) by mouth daily. 06/04/11  Yes Yehuda Savannah, MD  sitaGLIPtan (JANUVIA) 100 MG tablet Take 100 mg by mouth daily.     Yes Historical Provider, MD  ZETIA 10 MG tablet TAKE ONE TABLET DAILY AT BEDTIME 01/30/11  Yes Yehuda Savannah, MD   Allergies  Allergen Reactions  . Gentamycin (Gentamicin) Hives and Itching    Reaction noted post loop recorder implant, where gentamycin was used for irrigation.  . Atorvastatin   . Avandia (Rosiglitazone Maleate)   . Cephalexin   . Clindamycin/Lincomycin   . Codeine   .  Erythromycin   . Iodinated Diagnostic Agents   . Metformin   . Penicillins   . Simvastatin      Past medical history, social history, and family history reviewed and updated.  ROS: Denies orthopnea, PND, chest discomfort or syncope.  PHYSICAL EXAM: BP 139/71  Pulse 82  Ht 5' 2"  (1.575 m)  Wt 57.607 kg (127 lb)  BMI 23.23 kg/m2  SpO2 98%  General-Well developed; no acute distress Body habitus-proportionate weight and height Neck-No JVD; no carotid bruits Lungs-clear lung fields; resonant to percussion; prominent contour loop recorder over the left chest; well-healed incision without edema,  ecchymoses or tenderness Cardiovascular-normal PMI; normal S1 and S2; grade 2/6 basilar systolic ejection murmur Abdomen-normal bowel sounds; soft and non-tender without masses or organomegaly Musculoskeletal-No deformities, no cyanosis or clubbing Neurologic-Normal cranial nerves; symmetric strength and tone Skin-Warm, no significant lesions Extremities-distal pulses intact; no edema  ASSESSMENT AND PLAN:  [name], MD 12/21/2011 5:58 PM

## 2011-12-26 ENCOUNTER — Telehealth: Payer: Self-pay | Admitting: Internal Medicine

## 2011-12-26 NOTE — Telephone Encounter (Signed)
[name] [name] to call and schedule patient with the device clinic in [place] for a wound check and remote instruction.

## 2011-12-26 NOTE — Telephone Encounter (Signed)
Fu call Patient calling back about loop recorder

## 2011-12-27 ENCOUNTER — Encounter: Payer: Self-pay | Admitting: Cardiology

## 2011-12-27 ENCOUNTER — Encounter: Payer: Self-pay | Admitting: Internal Medicine

## 2011-12-27 ENCOUNTER — Ambulatory Visit (INDEPENDENT_AMBULATORY_CARE_PROVIDER_SITE_OTHER): Payer: BC Managed Care – PPO | Admitting: *Deleted

## 2011-12-27 DIAGNOSIS — I509 Heart failure, unspecified: Secondary | ICD-10-CM

## 2011-12-27 DIAGNOSIS — R55 Syncope and collapse: Secondary | ICD-10-CM

## 2011-12-27 NOTE — Progress Notes (Signed)
Wound check-ILR

## 2011-12-28 ENCOUNTER — Other Ambulatory Visit: Payer: Self-pay | Admitting: Cardiology

## 2012-01-03 ENCOUNTER — Telehealth: Payer: Self-pay | Admitting: Cardiology

## 2012-01-03 NOTE — Telephone Encounter (Signed)
Patient was screened for Kyphosis at [facility].  After this screening, they suggested she go through Physical Therapy.  Patient saw PCP today (Dr.Fusco) and was advised to check with Cardiologist to see if she would be able to go through PT.  Patient just had loop recorder placed 3 wks ago.  Please call patient. / tg

## 2012-01-04 NOTE — Telephone Encounter (Signed)
Please advise 

## 2012-01-06 NOTE — Telephone Encounter (Signed)
I recommend an initial visit with the physical therapist to discuss the program recommended for her.  If she can perform the therapy without excessive dyspnea or fatigue, there is no contraindication to participating.

## 2012-01-07 ENCOUNTER — Other Ambulatory Visit: Payer: Self-pay | Admitting: *Deleted

## 2012-01-07 DIAGNOSIS — R5381 Other malaise: Secondary | ICD-10-CM

## 2012-01-07 NOTE — Telephone Encounter (Signed)
Recommendations discussed with patient and a PT referral will be made for her.

## 2012-01-08 ENCOUNTER — Telehealth: Payer: Self-pay | Admitting: Oncology

## 2012-01-08 NOTE — Telephone Encounter (Signed)
Talked to pt and gave her new appt time for 02/06/12, lab and MD, moved due to [facility]

## 2012-01-11 ENCOUNTER — Telehealth: Payer: Self-pay | Admitting: *Deleted

## 2012-01-11 ENCOUNTER — Other Ambulatory Visit: Payer: Self-pay | Admitting: *Deleted

## 2012-01-11 DIAGNOSIS — I10 Essential (primary) hypertension: Secondary | ICD-10-CM

## 2012-01-11 NOTE — Telephone Encounter (Signed)
Has not received a call regarding PT evaluation referral that was placed in EPIC on 6/17.  Called PT to clarify and they state that is was not received.  Faxed referral today and made them aware that pt has met deductible and needs appt prior to July.  Pt made aware of this information.

## 2012-01-14 ENCOUNTER — Other Ambulatory Visit: Payer: BC Managed Care – PPO | Admitting: Lab

## 2012-01-14 ENCOUNTER — Ambulatory Visit: Payer: BC Managed Care – PPO | Admitting: Oncology

## 2012-01-23 ENCOUNTER — Other Ambulatory Visit: Payer: Self-pay | Admitting: *Deleted

## 2012-01-23 DIAGNOSIS — I10 Essential (primary) hypertension: Secondary | ICD-10-CM

## 2012-02-06 ENCOUNTER — Ambulatory Visit (HOSPITAL_BASED_OUTPATIENT_CLINIC_OR_DEPARTMENT_OTHER): Payer: BC Managed Care – PPO | Admitting: Oncology

## 2012-02-06 ENCOUNTER — Other Ambulatory Visit: Payer: BC Managed Care – PPO

## 2012-02-06 ENCOUNTER — Encounter: Payer: Self-pay | Admitting: Oncology

## 2012-02-06 ENCOUNTER — Telehealth: Payer: Self-pay | Admitting: Oncology

## 2012-02-06 VITALS — BP 130/63 | HR 77 | Temp 96.9°F | Ht 62.0 in | Wt 125.0 lb

## 2012-02-06 DIAGNOSIS — D649 Anemia, unspecified: Secondary | ICD-10-CM

## 2012-02-06 DIAGNOSIS — Z1231 Encounter for screening mammogram for malignant neoplasm of breast: Secondary | ICD-10-CM

## 2012-02-06 LAB — CBC WITH DIFFERENTIAL/PLATELET
Basophils Absolute: 0.1 10*3/uL (ref 0.0–0.1)
EOS%: 1.9 % (ref 0.0–7.0)
HCT: 38.1 % (ref 34.8–46.6)
HGB: 12.5 g/dL (ref 11.6–15.9)
MCH: 28.2 pg (ref 25.1–34.0)
MCV: 86.1 fL (ref 79.5–101.0)
MONO%: 6.5 % (ref 0.0–14.0)
NEUT%: 70.3 % (ref 38.4–76.8)
lymph#: 1.8 10*3/uL (ref 0.9–3.3)

## 2012-02-06 LAB — COMPREHENSIVE METABOLIC PANEL
AST: 36 U/L (ref 0–37)
BUN: 34 mg/dL — ABNORMAL HIGH (ref 6–23)
Calcium: 10.7 mg/dL — ABNORMAL HIGH (ref 8.4–10.5)
Chloride: 100 mEq/L (ref 96–112)
Creatinine, Ser: 1.2 mg/dL — ABNORMAL HIGH (ref 0.50–1.10)

## 2012-02-06 NOTE — Patient Instructions (Addendum)
Take omeprazole 20 mg daily  Ferrous fumarate is generic for Integra, and is available over the counter, tho you may have to ask pharmacist for this

## 2012-02-06 NOTE — Telephone Encounter (Signed)
Gave pt appt for July 2014 Md and lab, mammogram will be scheduled by patients after she see her cardiologist, per patient

## 2012-02-06 NOTE — Progress Notes (Signed)
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 She has radiation induced hypothyroidism and the treatment caused or worsened coronary artery disease. Oncologic history is also remarkable for right nephrectomy for renal cell carcinoma 2008. Additional medical problems include insulin dependent diabetes, aortic valve replacement 2011, CHF, and CABG,   Patient has not had problems in the past year that seem related to the Hodgkins or renal cell carcinoma directly. Major concern has been total of 4 syncopal episodes since Feb 2013 which appear to be cardiac related, now with implanted loop recorder by Dr Crissie Sickles. CT head 11-2011 had no acute abnormalities and CXR also 11-2011 had clear lungs and no effusion. She is due mammograms at [facility] in 02-2012.   Patient continues to work as school principal, tho activity is limited by her cardiac problems particularly. She has had no recent infectious illness. She denies chest pain or other new or different pain. She does have some new tenderness lateral left breast and still costochondritis symptoms at times. She cannot recall if topical Voltaren was particularly helpful after I saw her last. Appetite is at baseline, bowels are unchanged and she has not had bleeding. She follows diabetes closely, with insulin pump. She does have intermittent nausea and intermittent GERD; she takes omeprazole 20 mg daily for ~ a week at a time when the GERD is most bothersome. I have suggested she try the omeprazole daily for several weeks, to see if this improves symptoms more and hopefully also helps nausea. Some bilateral shin pain especially at night, without swelling. Remainder of 10 point Review of Systems negative.  Objective:  Vital  signs in last 24 hours:  BP 130/63  Pulse 77  Temp 96.9 F (36.1 C) (Oral)  Ht 5' 2"  (1.575 m)  Wt 125 lb (56.7 kg)  BMI 22.86 kg/m2  This weight is 1 lb less than a year ago HEENT:PERRLA, sclera clear, anicteric, oropharynx clear, no lesions and neck supple with midline trachea LymphaticsCervical, supraclavicular, and axillary nodes normal. Resp: clear to auscultation bilaterally and normal percussion bilaterally Cardio: regular rate and rhythm GI: soft, non-tender; bowel sounds normal; no masses,  no organomegaly. Not tender in epigastrium. Insulin pump in place Extremities: extremities normal, atraumatic, no cyanosis or edema. No tenderness along tib/fib now. Breasts: right without dominant mass, skin or nipple findings and left axilla/ LUE unremarkable. Right breast has slight tenderness laterally into axilla without dominant mass, no skin or nipple findings, no adenopathy, no swelling LUE. The loop recorder is on left anterior chest just above breast. Skin without rash or petechiae/ecchymosies Lab Results:  Results for orders placed in visit on 02/06/12  CBC WITH DIFFERENTIAL      Component Value Range   WBC 8.8  3.9 - 10.3 10e3/uL   NEUT# 6.2  1.5 - 6.5 10e3/uL   HGB 12.5  11.6 - 15.9 g/dL   HCT 38.1  34.8 - 46.6 %   Platelets 200  145 - 400 10e3/uL   MCV 86.1  79.5 - 101.0 fL   MCH 28.2  25.1 - 34.0 pg   MCHC 32.8  31.5 - 36.0 g/dL   RBC 4.43  3.70 - 5.45 10e6/uL   RDW 15.0 (*) 11.2 - 14.5 %   lymph# 1.8  0.9 - 3.3 10e3/uL  MONO# 0.6  0.1 - 0.9 10e3/uL   Eosinophils Absolute 0.2  0.0 - 0.5 10e3/uL   Basophils Absolute 0.1  0.0 - 0.1 10e3/uL   NEUT% 70.3  38.4 - 76.8 %   LYMPH% 20.0  14.0 - 49.7 %   MONO% 6.5  0.0 - 14.0 %   EOS% 1.9  0.0 - 7.0 %   BASO% 1.3  0.0 - 2.0 %  COMPREHENSIVE METABOLIC PANEL      Component Value Range   Sodium 141  135 - 145 mEq/L   Potassium 4.0  3.5 - 5.3 mEq/L   Chloride 100  96 - 112 mEq/L   CO2 31  19 - 32 mEq/L   Glucose, Bld  106 (*) 70 - 99 mg/dL   BUN 34 (*) 6 - 23 mg/dL   Creatinine, Ser 1.20 (*) 0.50 - 1.10 mg/dL   Total Bilirubin 0.4  0.3 - 1.2 mg/dL   Alkaline Phosphatase 84  39 - 117 U/L   AST 36  0 - 37 U/L   ALT 32  0 - 35 U/L   Total Protein 8.4 (*) 6.0 - 8.3 g/dL   Albumin 5.0  3.5 - 5.2 g/dL   Calcium 10.7 (*) 8.4 - 10.5 mg/dL     Studies/Results: Last studies in EMR in 11-2011 as above Medications: I have reviewed the patient's current medications. Will try omeprazole 20 mg daily for now  Assessment/Plan: 1.history of IIA nodular sclerosing Hodgkins treated with mantle radiation in 1980s, not active since then 2.post right nephrectomy for renal cell carcinoma 2008 3.insulin dependent diabetes 4. CAD, CHF, aortic valve replacement, CABG 5.symcopal episodes thought cardiac related 6.left breast tenderness: exam otherwise unremarkable. Due mammograms in August.   She would like to continue yearly visits at this office, which I am glad to do.    Shakeeta Godette P, MD   02/06/2012, 9:04 PM

## 2012-02-07 ENCOUNTER — Telehealth: Payer: Self-pay | Admitting: *Deleted

## 2012-02-07 ENCOUNTER — Encounter: Payer: Self-pay | Admitting: Cardiology

## 2012-02-07 ENCOUNTER — Telehealth: Payer: Self-pay | Admitting: Internal Medicine

## 2012-02-07 NOTE — Telephone Encounter (Signed)
THIS NOTE TO DR.LIVESAY'S DESK. PLEASE MAIL PRESCRIPTION TO PT.'S HOME.

## 2012-02-07 NOTE — Telephone Encounter (Signed)
Ok to have mammogram

## 2012-02-07 NOTE — Telephone Encounter (Signed)
Pt is due for her yearly mammogram , is this ok for her to do?

## 2012-02-08 ENCOUNTER — Other Ambulatory Visit: Payer: Self-pay | Admitting: *Deleted

## 2012-02-08 ENCOUNTER — Encounter: Payer: Self-pay | Admitting: Oncology

## 2012-02-08 ENCOUNTER — Encounter: Payer: Self-pay | Admitting: *Deleted

## 2012-02-08 MED ORDER — DICLOFENAC SODIUM 1 % TD GEL: 1.0000 "application " | TRANSDERMAL | Status: DC | PRN

## 2012-02-08 NOTE — Progress Notes (Signed)
RECEIVED A FAX FROM [facility] CONCERNING A PRIOR AUTHORIZATION FOR VOLTAREN GEL. THIS REQUEST WAS PLACED IN THE MANAGED CARE BIN.

## 2012-02-08 NOTE — Telephone Encounter (Signed)
Pt notified of refill on Voltaren called into [facility] this morning per pt request.

## 2012-02-08 NOTE — Progress Notes (Signed)
Called [employer] @ [phone] voltaren gel 1% is approved from 01/09/12-02/07/13.

## 2012-02-27 ENCOUNTER — Encounter: Payer: Self-pay | Admitting: Internal Medicine

## 2012-02-27 ENCOUNTER — Ambulatory Visit (INDEPENDENT_AMBULATORY_CARE_PROVIDER_SITE_OTHER): Payer: BC Managed Care – PPO | Admitting: *Deleted

## 2012-02-27 DIAGNOSIS — R55 Syncope and collapse: Secondary | ICD-10-CM

## 2012-02-27 NOTE — Progress Notes (Signed)
Loop recorder check

## 2012-03-06 ENCOUNTER — Ambulatory Visit (INDEPENDENT_AMBULATORY_CARE_PROVIDER_SITE_OTHER): Payer: BC Managed Care – PPO | Admitting: Cardiology

## 2012-03-06 ENCOUNTER — Encounter: Payer: Self-pay | Admitting: Cardiology

## 2012-03-06 DIAGNOSIS — E119 Type 2 diabetes mellitus without complications: Secondary | ICD-10-CM

## 2012-03-06 DIAGNOSIS — R55 Syncope and collapse: Secondary | ICD-10-CM

## 2012-03-06 DIAGNOSIS — I509 Heart failure, unspecified: Secondary | ICD-10-CM

## 2012-03-06 NOTE — Patient Instructions (Addendum)
Your physician recommends that you schedule a follow-up appointment in: 5 month follow up

## 2012-03-06 NOTE — Progress Notes (Deleted)
Name: [name] [name]    DOB: [birth year]  Age: 61 y.o.  MR#: [id number]       PCP:  Leonides Grills, MD      Insurance: @PAYORNAME @   CC:   No chief complaint on file.   VS BP 130/68  Pulse 64  Ht 5' 2"  (1.575 m)  Wt 124 lb 1.9 oz (56.3 kg)  BMI 22.70 kg/m2  Weights Current Weight  03/06/12 124 lb 1.9 oz (56.3 kg)  02/06/12 125 lb (56.7 kg)  12/21/11 127 lb (57.607 kg)    Blood Pressure  BP Readings from Last 3 Encounters:  03/06/12 130/68  02/06/12 130/63  12/21/11 139/71     Admit date:  (Not on file) Last encounter with RMR:  02/07/2012   Allergy Allergies  Allergen Reactions  . Gentamycin (Gentamicin) Hives and Itching    Reaction noted post loop recorder implant, where gentamycin was used for irrigation.  . Atorvastatin   . Avandia (Rosiglitazone Maleate)   . Cephalexin   . Clindamycin/Lincomycin   . Codeine   . Erythromycin   . Iodinated Diagnostic Agents   . Metformin   . Penicillins   . Simvastatin     Current Outpatient Prescriptions  Medication Sig Dispense Refill  . ALPRAZolam (XANAX) 0.5 MG tablet Take 0.25 mg by mouth at bedtime.       Marland Kitchen aspirin 81 MG tablet Take 81 mg by mouth daily.        . Calcium Carbonate-Vitamin D (RA CALCIUM PLUS VITAMIN D) 600-400 MG-UNIT per tablet Take 1 tablet by mouth 2 (two) times daily.       . cholecalciferol (VITAMIN D) 1000 UNITS tablet Take 1,000 Units by mouth daily.        . Coenzyme Q10 (COQ10 PO) Take 1 capsule by mouth daily.      . fenofibrate (TRICOR) 145 MG tablet Take 145 mg by mouth daily.      . furosemide (LASIX) 40 MG tablet Take 40 mg by mouth as needed.       . furosemide (LASIX) 80 MG tablet Take 80 mg by mouth daily.       . insulin glulisine (APIDRA) 100 UNIT/ML injection Inject into the skin daily. Sliding scale (insulin pump)      . Multiple Vitamins-Minerals (MULTIVITAMIN WITH MINERALS) tablet Take 1 tablet by mouth daily.        . nitroGLYCERIN (NITROLINGUAL) 0.4 MG/SPRAY spray Place 1  spray under the tongue every 5 (five) minutes as needed.        . OMEGA-3 KRILL OIL 300 MG CAPS Take 1 capsule by mouth daily.        Marland Kitchen omeprazole (PRILOSEC) 20 MG capsule Take 20 mg by mouth daily.       . potassium chloride SA (KLOR-CON M20) 20 MEQ tablet Take 40 mEq by mouth daily.       . pravastatin (PRAVACHOL) 10 MG tablet Take 1 tablet (10 mg total) by mouth daily.  90 tablet  3  . sitaGLIPtan (JANUVIA) 100 MG tablet Take 100 mg by mouth daily.        Marland Kitchen ZETIA 10 MG tablet TAKE ONE TABLET DAILY AT BEDTIME  90 tablet  3  . metoprolol tartrate (LOPRESSOR) 25 MG tablet TAKE ONE TABLET TWICE DAILY  180 tablet  1    Discontinued Meds:    Medications Discontinued During This Encounter  Medication Reason  . diclofenac sodium (VOLTAREN) 1 % GEL Error  . Fe Fum-FePoly-FA-Vit  C-Vit B3 (INTEGRA F) 125-1 MG CAPS Error    Patient Active Problem List  Diagnosis  . Hyperlipidemia  . COLONIC POLYPS, HX OF  . Hypertension  . Small bowel obstruction  . CHF (congestive heart failure)  . Aortic stenosis  . Coronary artery disease  . Osteopenia  . Anemia  . NASH (nonalcoholic steatohepatitis)  . Carcinoma, renal cell  . Diabetes mellitus type 2, controlled  . Duodenal ulcer  . Colitis, ischemic  . Gastroesophageal reflux disease  . Hodgkin's disease  . Cholelithiasis  . Fatigue  . Hypercalcemia  . Near syncope    LABS Clinical Support on 02/27/2012  Component Date Value  . PACEART TECH NOTES PM 02/27/2012                     Value:Loop recorder check in clinic. Pt walks in with complaints of knot at site. Pt removed stitch x 2 weeks ago. Dr Rayann Heman evaluated. Pt has real thin skin and device is superficial. Also, pt complains of having dizzy spell yesterday afternoon and had                          trouble recording episode. On interrogation pt had 1 symptom episode and 1 asystole episode with short pause. No episodes recorded from 02-26-12. Instructed pt on how to use activator and  actually recorded episode and episode was recorded on interrogation.                          Pt scheduled for GT on 03-12-12.  Appointment on 02/06/2012  Component Date Value  . WBC 02/06/2012 8.8   . NEUT# 02/06/2012 6.2   . HGB 02/06/2012 12.5   . HCT 02/06/2012 38.1   . Platelets 02/06/2012 200   . MCV 02/06/2012 86.1   . [facility] 02/06/2012 28.2   . MCHC 02/06/2012 32.8   . RBC 02/06/2012 4.43   . RDW 02/06/2012 15.0*  . lymph# 02/06/2012 1.8   . MONO# 02/06/2012 0.6   . Eosinophils Absolute 02/06/2012 0.2   . Basophils Absolute 02/06/2012 0.1   . NEUT% 02/06/2012 70.3   . LYMPH% 02/06/2012 20.0   . MONO% 02/06/2012 6.5   . EOS% 02/06/2012 1.9   . BASO% 02/06/2012 1.3   . Sodium 02/06/2012 141   . Potassium 02/06/2012 4.0   . Chloride 02/06/2012 100   . CO2 02/06/2012 31   . Glucose, Bld 02/06/2012 106*  . BUN 02/06/2012 34*  . Creatinine, Ser 02/06/2012 1.20*  . Total Bilirubin 02/06/2012 0.4   . Alkaline Phosphatase 02/06/2012 84   . AST 02/06/2012 36   . ALT 02/06/2012 32   . Total Protein 02/06/2012 8.4*  . Albumin 02/06/2012 5.0   . Calcium 02/06/2012 10.7*  Clinical Support on 12/27/2011  Component Date Value  . PACEART TECH NOTES PM 12/27/2011 Loop check in clinic.  Wound check today. Some swelling noted but no redness.  1 symptoms activated recording shows SR.  ROV in August with Dr. Lovena Le.   Admission on 12/10/2011, Discharged on 12/10/2011  Component Date Value  . Glucose-Capillary 12/10/2011 86   . MRSA, PCR 12/10/2011 NEGATIVE   . Staphylococcus aureus 12/10/2011 NEGATIVE   . Glucose-Capillary 12/10/2011 81   . Glucose-Capillary 12/10/2011 124*  Office Visit on 12/06/2011  Component Date Value  . aPTT 12/06/2011 35   . Prothrombin Time 12/06/2011 14.0   . INR  12/06/2011 1.04      Results for this Opt Visit:     Results for orders placed in visit on 02/27/12  PACEMAKER DEVICE OBSERVATION      Component Value Range   PACEART TECH NOTES PM        Value: Loop recorder check in clinic. Pt walks in with complaints of knot at site. Pt removed stitch x 2 weeks ago. Dr Rayann Heman evaluated. Pt has real thin skin and device is superficial. Also, pt complains of having dizzy spell yesterday afternoon and had      trouble recording episode. On interrogation pt had 1 symptom episode and 1 asystole episode with short pause. No episodes recorded from 02-26-12. Instructed pt on how to use activator and actually recorded episode and episode was recorded on interrogation.      Pt scheduled for GT on 03-12-12.    EKG Orders placed during the hospital encounter of 11/30/11  . EKG 12-LEAD  . EKG 12-LEAD  . ED EKG  . ED EKG  . EKG 12-LEAD  . EKG 12-LEAD  . EKG     Prior Assessment and Plan Problem List as of 03/06/2012            Cardiology Problems   Hyperlipidemia   Last Assessment & Plan Note   09/19/2011 Office Visit Addendum 09/23/2011  8:02 AM by Yehuda Savannah, MD    Most recent lipid profile is suboptimal, but patient is intolerant of high-dose statin therapy.  I have suspected that coronary disease is related to radiation therapy rather than ASCVD.  She has had single vessel disease without apparent progression over the past 10 years.  I think it likely that she will not be troubled by coronary disease in the future regardless of the adequacy of control of hyperlipidemia.  Nonetheless, we will use alternative medication to lower her cholesterol to the extent possible.    Hypertension   Last Assessment & Plan Note   12/21/2011 Office Visit Signed 12/21/2011  6:05 PM by Yehuda Savannah, MD    Systolics all below 024 this month and diastolics below 80 representing adequate control of hypertension.  There was no orthostatic change when measured at this visit.  Her symptoms of lightheadedness are not clearly related to hypotension.    CHF (congestive heart failure)   Last Assessment & Plan Note   12/21/2011 Office Visit Signed 12/21/2011  6:04 PM by  Yehuda Savannah, MD    Patient appears well compensated by exam and has not experienced increasing dyspnea or decreased exercise tolerance even though her weight is slightly up.  She will continue to take an extra 40 mg of furosemide per day until weight returns to baseline.  We will closely monitor electrolytes and renal function.    Aortic stenosis   Last Assessment & Plan Note   12/06/2011 Office Visit Addendum 12/09/2011 10:45 PM by Yehuda Savannah, MD    Patient required a small prosthetic valve and likely has a component of valve-patient mismatch.  Valve sounds are crisp without evidence for thrombosis or dysfunction.    Coronary artery disease   Last Assessment & Plan Note   09/19/2011 Office Visit Addendum 09/23/2011  8:00 AM by Yehuda Savannah, MD    Patient has remained stable with respect to coronary disease following surgical revascularization, I doubt that she will experience additional symptoms related to this problem in the foreseeable future.    Near syncope   Last Assessment & Plan  Note   12/21/2011 Office Visit Addendum 12/24/2011  7:40 AM by Yehuda Savannah, MD    Although she activated the event recorder on one occasion, this was not for a spell similar to 3 that caused loss of consciousness or near loss of consciousness.  She was encouraged to continue to activate the event recorder for any symptoms and will return to see Dr. Lovena Le at which time will she will be provided with equipment to allow her to transmit events via telephone.      Other   Hodgkin's disease   COLONIC POLYPS, HX OF   Small bowel obstruction   Osteopenia   Last Assessment & Plan Note   09/19/2011 Office Visit Signed 09/19/2011  4:41 PM by Yehuda Savannah, MD    Osteopenia could be related to borderline hyperparathyroidism.  Bone densitometry was last performed 2 years ago.  A repeat study would be of interest.    Anemia   Last Assessment & Plan Note   12/06/2011 Office Visit Signed 12/06/2011  12:51 PM by Yehuda Savannah, MD    Anemia is minimal at this point without a clear etiology.    NASH (nonalcoholic steatohepatitis)   Last Assessment & Plan Note   09/19/2011 Office Visit Signed 09/19/2011  4:40 PM by Yehuda Savannah, MD    LFTs have been minimally abnormal on recent determinations, but have returned to normal as of testing in 08/2011.    Carcinoma, renal cell   Diabetes mellitus type 2, controlled   Duodenal ulcer   Colitis, ischemic   Gastroesophageal reflux disease   Cholelithiasis   Fatigue   Last Assessment & Plan Note   08/13/2011 Office Visit Addendum 08/15/2011 11:03 PM by Yehuda Savannah, MD    No apparent reason for patient's recent deterioration.  TSH was normal earlier this month.  CBC shows stable mild chronic anemia at a level that would not be expected to cause the symptoms.  This would be an unusual reaction to an ACE inhibitor, but since renal function has deteriorated, that medication will be stopped.  She has also developed more significant hypercalcemia with a recent level of 11.1.  This apparently is due to deterioration in renal dysfunction perhaps related to ACE inhibitor.  A value will be rechecked in one month.    Hypercalcemia   Last Assessment & Plan Note   09/19/2011 Office Visit Addendum 09/23/2011  8:01 AM by Yehuda Savannah, MD    Calcium level has decreased from a peak of 11.1 to 10.6.  Dr. Eden Emms has reviewed laboratory studies and advised patient that there is inadequate evidence for true hyperparathyroidism.  She does appear to have at least a borderline abnormality with a low normal PTH level.  Borderline hypercalcemia was likely exacerbated by administration of vitamin D and calcium plus a degree of acute renal failure associated with ACE inhibitor.        Imaging: No results found.   FRS Calculation: Score not calculated. Missing: Total Cholesterol

## 2012-03-09 NOTE — Progress Notes (Signed)
Patient ID: ERI MCEVERS, female   DOB: August 18, 1950, 61 y.o.   MRN: [id number]  HPI: Scheduled return visit for this lovely woman with multiple cardiac and other complications of treatment for Hodgkin's disease decades ago.  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   An event recorder was placed a few months ago for evaluation of recurrent syncope, but no significant arrhythmias have been identified nor has she experienced any recurrence of symptoms.  Prior to Admission medications   Medication Sig Start Date End Date Taking? Authorizing Provider  ALPRAZolam Duanne Moron) 0.5 MG tablet Take 0.25 mg by mouth at bedtime.    Yes Historical Provider, MD  aspirin 81 MG tablet Take 81 mg by mouth daily.     Yes Historical Provider, MD  Calcium Carbonate-Vitamin D (RA CALCIUM PLUS VITAMIN D) 600-400 MG-UNIT per tablet Take 1 tablet by mouth 2 (two) times daily.    Yes Historical Provider, MD  cholecalciferol (VITAMIN D) 1000 UNITS tablet Take 1,000 Units by mouth daily.     Yes Historical Provider, MD  Coenzyme Q10 (COQ10 PO) Take 1 capsule by mouth daily.   Yes Historical Provider, MD  fenofibrate (TRICOR) 145 MG tablet Take 145 mg by mouth daily.   Yes Historical Provider, MD  furosemide (LASIX) 40 MG tablet Take 40 mg by mouth as needed.  12/28/11  Yes Yehuda Savannah, MD  furosemide (LASIX) 80 MG tablet Take 80 mg by mouth daily.  11/23/11  Yes Yehuda Savannah, MD  insulin glulisine (APIDRA) 100 UNIT/ML injection Inject into the skin daily. Sliding scale (insulin pump)   Yes Historical Provider, MD  Multiple Vitamins-Minerals (MULTIVITAMIN WITH MINERALS) tablet Take 1 tablet by mouth daily.     Yes Historical Provider, MD  nitroGLYCERIN (NITROLINGUAL) 0.4 MG/SPRAY spray Place 1 spray under the tongue every 5 (five)  minutes as needed.     Yes Historical Provider, MD  OMEGA-3 KRILL OIL 300 MG CAPS Take 1 capsule by mouth daily.     Yes Historical Provider, MD  omeprazole (PRILOSEC) 20 MG capsule Take 20 mg by mouth daily.    Yes Historical Provider, MD  potassium chloride SA (KLOR-CON M20) 20 MEQ tablet Take 40 mEq by mouth daily.  11/23/11  Yes Yehuda Savannah, MD  pravastatin (PRAVACHOL) 10 MG tablet Take 1 tablet (10 mg total) by mouth daily. 06/04/11  Yes Yehuda Savannah, MD  sitaGLIPtan (JANUVIA) 100 MG tablet Take 100 mg by mouth daily.     Yes Historical Provider, MD  ZETIA 10 MG tablet TAKE ONE TABLET DAILY AT BEDTIME 01/30/11  Yes Yehuda Savannah, MD  metoprolol tartrate (LOPRESSOR) 25 MG tablet TAKE ONE TABLET TWICE DAILY 10/30/11   Yehuda Savannah, MD   Allergies  Allergen Reactions  . Gentamycin (Gentamicin) Hives and Itching    Reaction noted post loop recorder implant, where gentamycin was used for irrigation.  . Atorvastatin   . Avandia (Rosiglitazone Maleate)   . Cephalexin   . Clindamycin/Lincomycin   . Codeine   . Erythromycin   . Iodinated Diagnostic Agents   . Metformin   . Penicillins   . Simvastatin      Past medical history, social history, and family history reviewed and updated.  ROS: Denies orthopnea, PND, chest pain, pedal edema or syncope.  All other systems reviewed and are negative.  PHYSICAL EXAM: BP 130/68  Pulse 64  Ht 5' 2"  (1.575 m)  Wt 56.3 kg (124 lb 1.9 oz)  BMI 22.70 kg/m2  General-Well developed; no acute distress Body habitus-thin Neck-No JVD; no carotid bruits Lungs-clear lung fields; resonant to percussion; median sternotomy scar x2; fluid collection or soft tissue nodule at the medial aspect of her ILR-otherwise benign Cardiovascular-normal PMI; normal S1 and S2; grade 2/6 basilar systolic ejection murmur Abdomen-normal bowel sounds; soft and non-tender without masses or organomegaly Musculoskeletal-No deformities, no cyanosis or  clubbing Neurologic-Normal cranial nerves; symmetric strength and tone Skin-Warm, no significant lesions Extremities-distal pulses intact; no edema  ASSESSMENT AND PLAN:  [name], MD 03/09/2012 10:27 PM

## 2012-03-09 NOTE — Assessment & Plan Note (Signed)
Stable symptoms of chronic congestive heart failure are likely multifactorial and related to radiation-induced cardiomyopathy, aortic valve-patient mismatch, and physical deconditioning.

## 2012-03-09 NOTE — Assessment & Plan Note (Signed)
Insulin requirement has been much higher recently than in the past, but diabetic control remains suboptimal.  Endocrinology is working very effectively and closely with [name] [name] to optimize treatment.

## 2012-03-09 NOTE — Assessment & Plan Note (Signed)
No significant arrhythmias identified by her implanted loop recorder, but there has been no recurrence of the symptoms that led to implantation of the device.

## 2012-03-12 ENCOUNTER — Encounter: Payer: Self-pay | Admitting: Internal Medicine

## 2012-03-12 ENCOUNTER — Ambulatory Visit (INDEPENDENT_AMBULATORY_CARE_PROVIDER_SITE_OTHER): Payer: BC Managed Care – PPO | Admitting: Internal Medicine

## 2012-03-12 VITALS — BP 120/64 | HR 80 | Ht 62.5 in | Wt 126.4 lb

## 2012-03-12 DIAGNOSIS — R55 Syncope and collapse: Secondary | ICD-10-CM

## 2012-03-12 DIAGNOSIS — I2510 Atherosclerotic heart disease of native coronary artery without angina pectoris: Secondary | ICD-10-CM

## 2012-03-12 NOTE — Patient Instructions (Signed)
Your physician recommends that you schedule a follow-up appointment in: 6 months with device clinic.  Call with episodes of syncope between now and then and we will see you sooner.

## 2012-03-12 NOTE — Progress Notes (Signed)
HPI [name] [name] returns today for followup. She is a very pleasant 61 year old woman status post aortic valve replacement, and coronary disease status post bypass surgery. She had syncope in the setting of preserved left ventricular function and underwent insertion of an implantable loop recorder. She has had no recurrent syncope. She denies chest pain or shortness of breath. Allergies  Allergen Reactions  . Gentamycin (Gentamicin) Hives and Itching    Reaction noted post loop recorder implant, where gentamycin was used for irrigation.  . Atorvastatin   . Avandia (Rosiglitazone Maleate)   . Cephalexin   . Clindamycin/Lincomycin   . Codeine   . Erythromycin   . Iodinated Diagnostic Agents   . Metformin   . Penicillins   . Simvastatin      Current Outpatient Prescriptions  Medication Sig Dispense Refill  . ALPRAZolam (XANAX) 0.5 MG tablet Take 0.25 mg by mouth at bedtime.       Marland Kitchen aspirin 81 MG tablet Take 81 mg by mouth daily.        . Calcium Carbonate-Vitamin D (RA CALCIUM PLUS VITAMIN D) 600-400 MG-UNIT per tablet Take 1 tablet by mouth 2 (two) times daily.       . cholecalciferol (VITAMIN D) 1000 UNITS tablet Take 1,000 Units by mouth daily.        . Coenzyme Q10 (COQ10 PO) Take 1 capsule by mouth daily.      . fenofibrate (TRICOR) 145 MG tablet Take 145 mg by mouth daily.      . furosemide (LASIX) 40 MG tablet Take 40 mg by mouth as needed.       . furosemide (LASIX) 80 MG tablet Take 80 mg by mouth daily.       . insulin glulisine (APIDRA) 100 UNIT/ML injection Inject into the skin daily. Sliding scale (insulin pump)      . metoprolol tartrate (LOPRESSOR) 25 MG tablet TAKE ONE TABLET TWICE DAILY  180 tablet  1  . Multiple Vitamins-Minerals (MULTIVITAMIN WITH MINERALS) tablet Take 1 tablet by mouth daily.        . nitroGLYCERIN (NITROLINGUAL) 0.4 MG/SPRAY spray Place 1 spray under the tongue every 5 (five) minutes as needed.        . OMEGA-3 KRILL OIL 300 MG CAPS Take 1 capsule  by mouth daily.        Marland Kitchen omeprazole (PRILOSEC) 20 MG capsule Take 20 mg by mouth daily.       . potassium chloride SA (KLOR-CON M20) 20 MEQ tablet Take 40 mEq by mouth daily.       . pravastatin (PRAVACHOL) 10 MG tablet Take 1 tablet (10 mg total) by mouth daily.  90 tablet  3  . sitaGLIPtan (JANUVIA) 100 MG tablet Take 100 mg by mouth daily.        Marland Kitchen ZETIA 10 MG tablet TAKE ONE TABLET DAILY AT BEDTIME  90 tablet  3     Past Medical History  Diagnosis Date  . Aortic stenosis      bicuspid aortic valve; mean gradient of 20 mmHg in 2/10; aortic valve   replacement surgery(19-mm Edwards pericardial valve) with a redo coronary artery bypass graft procedure      in 07/2009; postoperative Dressler's syndrome    . Coronary artery disease     status post RCA saphenous vein graft in 06/1999; multiple preceding PCIs with restenosis; total obstruction of RCA in 2006 with patent graft   . CHF (congestive heart failure)     History of CHF;  recurred following AVR/CABG surgery; intermittent PND  . Hyperlipidemia   . Hypertension   . Small bowel obstruction     Recurrent; resolved after resection of a lipoma  . Osteopenia      hip on DEXA in October 2007.  Marland Kitchen Anemia   . Carcinoma, renal cell 05/2007    Laparoscopic right nephrectomy  . NASH (nonalcoholic steatohepatitis) 1999    -biopsy in 1999  . Diabetes mellitus 03/2010     TYPE II: Hemoglobin A1c of 7.4 in  03/2010; 8.4 in 06/2010; treated with insulin pump  . Migraines   . Duodenal ulcer     Remote; H. pylori positive  . Colitis, ischemic 2008  . Gastroesophageal reflux disease     ? of GASTROPARESIS; IRRITABLE BOWEL SYNDROME  . Hodgkin's disease 1991    Mantle radiation therapy  . Exertional dyspnea     chest discomfort  . Statin intolerance   . Hypercalcemia     Ca-11.1 in 07/2011    ROS:   All systems reviewed and negative except as noted in the HPI.   Past Surgical History  Procedure Date  . Total abdominal hysterectomy w/  bilateral salpingoophorectomy 2000  . Breast excisional biopsy     benign  . Nephrectomy 2008    Laparoscopic right nephrectomy, renal cell carcinoma  . Coronary artery bypass graft 2005, 2011    CABG-most recent SVG to RCA-12/05; RCA occluded with patent graft in 2006; Redo bypass in 07/2009  . Tumor excision 1981    removal of Hodgkins lymphoma  . Exploratory laparotomy w/ bowel resection     Small bowel resection of lipoma  . Aortic valve replacement 2011    Aortic valve replacement surgery with a 19 mm bioprosthetic device post redo CABG January 2011.  . Colonoscopy   . Cervical biopsy   . Liver biopsy      No family history on file.   History   Social History  . Marital Status: Married    Spouse Name: N/A    Number of Children: 2  . Years of Education: N/A   Occupational History  . full time     elementary principal   Social History Main Topics  . Smoking status: Never Smoker   . Smokeless tobacco: Never Used  . Alcohol Use: No  . Drug Use: No  . Sexually Active: Not on file   Other Topics Concern  . Not on file   Social History Narrative  . No narrative on file     BP 120/64  Pulse 80  Ht 5' 2.5" (1.588 m)  Wt 126 lb 6.4 oz (57.335 kg)  BMI 22.75 kg/m2  Physical Exam:  Well appearing middle-aged woman, NAD HEENT: Unremarkable Neck:  No JVD, no thyromegally Lungs:  Clear with no wheezes, rales, or rhonchi. HEART:  Regular rate rhythm, grade 1/6 systolic murmur, no rubs, no clicks Abd:  soft, positive bowel sounds, no organomegally, no rebound, no guarding Ext:  2 plus pulses, no edema, no cyanosis, no clubbing Skin:  No rashes no nodules Neuro:  CN II through XII intact, motor grossly intact   DEVICE  Normal device function.  See PaceArt for details. Interrogation of her implantable loop recorder demonstrates no bradycardia or tachycardia arrhythmias.  Assess/Plan:

## 2012-03-12 NOTE — Assessment & Plan Note (Signed)
She has had no recurrent syncope. Plan is to patient back in several months.

## 2012-03-12 NOTE — Assessment & Plan Note (Signed)
She denies anginal symptoms. I've encouraged the patient to increase her physical activity and continue her current medical therapy.

## 2012-04-16 ENCOUNTER — Ambulatory Visit: Payer: BC Managed Care – PPO

## 2012-04-17 ENCOUNTER — Encounter: Payer: Self-pay | Admitting: Cardiology

## 2012-04-17 DIAGNOSIS — R0609 Other forms of dyspnea: Secondary | ICD-10-CM

## 2012-04-18 ENCOUNTER — Ambulatory Visit: Payer: BC Managed Care – PPO | Admitting: Internal Medicine

## 2012-05-01 ENCOUNTER — Ambulatory Visit (INDEPENDENT_AMBULATORY_CARE_PROVIDER_SITE_OTHER): Payer: BC Managed Care – PPO | Admitting: Internal Medicine

## 2012-05-01 ENCOUNTER — Encounter: Payer: Self-pay | Admitting: Internal Medicine

## 2012-05-01 VITALS — BP 132/56 | HR 68 | Ht 62.25 in | Wt 124.0 lb

## 2012-05-01 DIAGNOSIS — R1314 Dysphagia, pharyngoesophageal phase: Secondary | ICD-10-CM

## 2012-05-01 DIAGNOSIS — I25810 Atherosclerosis of coronary artery bypass graft(s) without angina pectoris: Secondary | ICD-10-CM

## 2012-05-01 DIAGNOSIS — K3184 Gastroparesis: Secondary | ICD-10-CM

## 2012-05-01 DIAGNOSIS — Z86010 Personal history of colonic polyps: Secondary | ICD-10-CM

## 2012-05-01 DIAGNOSIS — R1310 Dysphagia, unspecified: Secondary | ICD-10-CM

## 2012-05-01 DIAGNOSIS — K5900 Constipation, unspecified: Secondary | ICD-10-CM

## 2012-05-01 DIAGNOSIS — E1143 Type 2 diabetes mellitus with diabetic autonomic (poly)neuropathy: Secondary | ICD-10-CM

## 2012-05-01 DIAGNOSIS — K219 Gastro-esophageal reflux disease without esophagitis: Secondary | ICD-10-CM

## 2012-05-01 DIAGNOSIS — E1149 Type 2 diabetes mellitus with other diabetic neurological complication: Secondary | ICD-10-CM

## 2012-05-01 HISTORY — DX: Type 2 diabetes mellitus with diabetic autonomic (poly)neuropathy: E11.43

## 2012-05-01 MED ORDER — OMEPRAZOLE 20 MG PO CPDR: 20.0000 mg | DELAYED_RELEASE_CAPSULE | Freq: Two times a day (BID) | ORAL | Status: DC

## 2012-05-01 NOTE — Progress Notes (Signed)
Subjective:    Patient ID: [name] [name], female    DOB: 1951-05-20, 61 y.o.   MRN: [id number]  HPI This is a very pleasant middle-aged white woman with numerous medical problems. 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 She has now been out of work for a few weeks and her blood sugars are improved, but she is having some issues with low blood sugars at times. Prior to that she had peak blood pressures as high as 3 or 400. Overall she is feeling somewhat better but is having some gastrointestinal issues.  She is having nausea and nocturnal reflux. 2-4 times a week she will awaken with either intense nausea and belching and burping or hot burning heartburn and reflux. Prior to the past few weeks since she left work this was more frequent and she had daytime nausea problems. She is on Prilosec 20 mg daily in the morning. She has developed some intermittent pill and then some solid food dysphagia where there is a suprasternal sticking point and things move slowly, with the sensation in the suprasternal notch. She does have a wedge which she sleeps on is pursuing a medical bed that she could elevate separately from her husband. Prior to eating work she would eat at 7 PM and lie down in bed the 29 to 9:30. Now it's 5:30 to 6 PM to eat and then lie down at 9:30 to 10.  She tends to have hard stools are somewhat difficult and painful to expel but is not having bleeding problems. Every so often she will unload with multiple stools are a very long softer stool. Stool softener did not seem to make a difference. Sometimes her right lower quadrant incisional hernia will protrude when she is constipated.  Allergies  Allergen Reactions  . Gentamycin (Gentamicin) Hives and Itching    Reaction noted post loop recorder implant, where gentamycin was used for irrigation.  . Atorvastatin   . Avandia (Rosiglitazone  Maleate)   . Cephalexin   . Clindamycin/Lincomycin   . Codeine   . Erythromycin   . Iodinated Diagnostic Agents   . Metformin   . Penicillins   . Simvastatin    Outpatient Prescriptions Prior to Visit  Medication Sig Dispense Refill  . ALPRAZolam (XANAX) 0.5 MG tablet Take 0.25 mg by mouth at bedtime.       Marland Kitchen aspirin 81 MG tablet Take 81 mg by mouth daily.        . Calcium Carbonate-Vitamin D (RA CALCIUM PLUS VITAMIN D) 600-400 MG-UNIT per tablet Take 1 tablet by mouth 2 (two) times daily.       . cholecalciferol (VITAMIN D) 1000 UNITS tablet Take 1,000 Units by mouth daily.        . Coenzyme Q10 (COQ10 PO) Take 600 mg by mouth daily.       . fenofibrate (TRICOR) 145 MG tablet Take 145 mg by mouth daily.      . furosemide (LASIX) 40 MG tablet Take 40 mg by mouth as needed.       . furosemide (LASIX) 80 MG tablet Take 80 mg by mouth daily.       . insulin glulisine (APIDRA) 100 UNIT/ML injection Inject into the skin daily. Sliding scale (insulin pump)      . metoprolol tartrate (LOPRESSOR) 25 MG tablet TAKE ONE TABLET TWICE DAILY  180 tablet  1  . Multiple Vitamins-Minerals (MULTIVITAMIN WITH  MINERALS) tablet Take 1 tablet by mouth daily.        . nitroGLYCERIN (NITROLINGUAL) 0.4 MG/SPRAY spray Place 1 spray under the tongue every 5 (five) minutes as needed.        . OMEGA-3 KRILL OIL 300 MG CAPS Take 1 capsule by mouth daily.        . potassium chloride SA (KLOR-CON M20) 20 MEQ tablet Take 40 mEq by mouth daily.       . pravastatin (PRAVACHOL) 10 MG tablet Take 1 tablet (10 mg total) by mouth daily.  90 tablet  3  . sitaGLIPtan (JANUVIA) 100 MG tablet Take 100 mg by mouth daily.        Marland Kitchen ZETIA 10 MG tablet TAKE ONE TABLET DAILY AT BEDTIME  90 tablet  3  . omeprazole (PRILOSEC) 20 MG capsule Take 20 mg by mouth daily.        Past Medical History  Diagnosis Date  . Aortic stenosis      bicuspid aortic valve; mean gradient of 20 mmHg in 2/10; aortic valve   replacement surgery(19-mm  Edwards pericardial valve) with a redo coronary artery bypass graft procedure      in 07/2009; postoperative Dressler's syndrome    . Coronary artery disease     status post RCA saphenous vein graft in 06/1999; multiple preceding PCIs with restenosis; total obstruction of RCA in 2006 with patent graft   . CHF (congestive heart failure)     History of CHF; recurred following AVR/CABG surgery; intermittent PND  . Hyperlipidemia   . Hypertension   . Small bowel obstruction     Recurrent; resolved after resection of a lipoma  . Osteopenia      hip on DEXA in October 2007.  Marland Kitchen Anemia   . Carcinoma, renal cell 05/2007    Laparoscopic right nephrectomy  . NASH (nonalcoholic steatohepatitis) 1999    -biopsy in 1999  . Diabetes mellitus 03/2010     TYPE II: Hemoglobin A1c of 7.4 in  03/2010; 8.4 in 06/2010; treated with insulin pump  . Migraines   . Duodenal ulcer     Remote; H. pylori positive  . Colitis, ischemic 2008  . Gastroesophageal reflux disease     ? of GASTROPARESIS; IRRITABLE BOWEL SYNDROME  . Hodgkin's disease 1991    Mantle radiation therapy  . Exertional dyspnea     chest discomfort  . Statin intolerance   . Hypercalcemia     Ca-11.1 in 07/2011  . Vitamin D deficiency   . Costochondritis   . Insomnia   . Anemia    Past Surgical History  Procedure Date  . Total abdominal hysterectomy w/ bilateral salpingoophorectomy 2000  . Breast excisional biopsy     benign, bilateral  . Nephrectomy 2008    Laparoscopic right nephrectomy, renal cell carcinoma  . Coronary artery bypass graft 2005, 2011    CABG-most recent SVG to RCA-12/05; RCA occluded with patent graft in 2006; Redo bypass in 07/2009  . Tumor excision 1981    removal of Hodgkins lymphoma  . Exploratory laparotomy w/ bowel resection     Small bowel resection of lipoma  . Aortic valve replacement 2011    Aortic valve replacement surgery with a 19 mm bioprosthetic device post redo CABG January 2011.  . Colonoscopy      Multiple with adenomatous polyps  . Cervical biopsy   . Liver biopsy   . Bone marrow biopsy   . Esophagogastroduodenoscopy    History  Social History  . Marital Status: Married    Spouse Name: N/A    Number of Children: 2  .     Occupational History  . full time     elementary principal   Social History Main Topics  . Smoking status: Never Smoker   . Smokeless tobacco: Never Used  . Alcohol Use: No  . Drug Use: No    Social History Narrative   School principal, marriedStarted disability September 2013   Family History  Problem Relation Age of Onset  . Heart attack Mother   . Heart attack Father   . Diabetes Father   . Hypertension Brother   . Glaucoma Brother   . Lung cancer Sister     meta  . Ovarian cancer Sister   . Breast cancer Sister         Review of Systems Positive for those things mentioned in the history of present illness. She is gaining some strength since leaving work. She has angina about twice a month related to exertion and is usually relieved with rest, if that does not work she will use nitroglycerin but this is causes extreme headache. She was having syncope problems but none since June, she has a loop monitor in place so far unrevealing except for one mild bradycardic episode. It was not associated with symptoms. She has right upper chest pain with costochondritis. A steroid injection into her buttocks earlier this year did seem to help. She tried voltaren gel her about a week over the summer and that did not seem to help. She is wondering if she could take anti-inflammatories orally but her primary care doctor has recommended against that because of a history of ulcers or mood and affect are improved since leaving work though she did not want to she accepts that she gave her all and this is an appropriate decision considering her overall health at this time. All other review of systems are negative.    Objective:   Physical Exam General:    Well-developed, well-nourished and in no acute distress Eyes:  anicteric. Neck:   supple   Lungs: Clear to auscultation bilaterally. Heart:  S1S2, no rubs, + soft RUSB systolioc murmur Abdomen:  soft, non-tender, no hepatosplenomegaly, or mass and BS+. No splash. Small RLQ incisional hernia Rectal: deferred Lymph:  no cervical or supraclavicular adenopathy. Extremities:   no edema Skin   no rash.+ implanted loop recorder in left upper chest Neuro:  A&O x 3.  Psych:  appropriate mood and  Affect.   Data Reviewed: Cardiology notes Recent diabetes visit with Dr. Elyse Hsu 2012 hospitalization dc summary 2008 colonoscopy and pathology report, 2008 EGD showing gastritis      Assessment & Plan:   1. Gastroparesis due to DM   2. GERD (gastroesophageal reflux disease)   I think she is most likely having gastroparesis driving GERD symptoms. Her recent problems with glucose control have likely contributed. I am going to add Prilosec 20 mg before supper. A gastroparesis diet was discussed with the patient and she will use that. Starting with step 3. An upper GI endoscopy will be undertaken for this and her dysphagia, with plans for possible dilation. She will also work on getting a new bed to elevate her body more consistently, and increasing time between eating and retiring and lying down. Nocturnal metoclopramide could be an option but would prefer to avoid that if possible. She had taken a years ago for reflux.   3. Esophageal dysphagia  This seems mild but could be from esophagitis, candida is in the differential but seems unlikely to me given the overall symptom complex and problems, and upper GI endoscopy with possible esophageal dilation will be scheduled.   4. Personal history of colonic polyps   Adenomatous polyps in 2005 and 2008, repeat screening and surveillance colonoscopy will be scheduled.   5. CAD (coronary artery disease) of artery bypass graft   6. Constipation   She  will try adding MiraLax on a regular basis to see if this helps. I suspect this is part of her diabetic gut. Medication effect may be possible as well. Gastroparesis I would avoid increasing fiber.         Note that she is on an insulin pump. We will get advice from Dr. Elyse Hsu on how to manage her insulin prior to her endoscopic procedures and I will also get advice from Dr. Harrington Challenger part of any specific measures otherwise would be needed given her cardiac comorbidities though I suspect it should be okay. I do intend to perform her procedures at the hospital We will arrange those once I get this information.  CC: Leonides Grills, MD [name], MD Lorne Skeens, MD, Evlyn Clines, MD, Dutch Gray, MD

## 2012-05-01 NOTE — Patient Instructions (Addendum)
Today we are giving you a handout on Gastroparesis, use the Step 3 diet.  Try to pursue elevation of your bed and wait at least four hours after eating before lying down.  Take your Prilosec twice a day.  Try Miralax 1/2-1 dose every day for constipation.  Dr. [name] is going to consult with Dr. Lattie Haw and Dr. Elyse Hsu and we will be in touch regarding setting up a colonoscopy and EGD procedures at [facility].  Thank you for choosing me and [facility].  Gatha Mayer, M.D., [facility]

## 2012-05-05 ENCOUNTER — Other Ambulatory Visit: Payer: Self-pay | Admitting: Cardiology

## 2012-05-05 ENCOUNTER — Telehealth: Payer: Self-pay | Admitting: *Deleted

## 2012-05-05 MED ORDER — EZETIMIBE 10 MG PO TABS: 10.0000 mg | ORAL_TABLET | Freq: Every day | ORAL | Status: DC

## 2012-05-05 NOTE — Telephone Encounter (Signed)
Patient made aware of Dr Izell [place] recommendations to contact [facility] for assessment of scoliosis.  Address and phone number provided to her.

## 2012-05-05 NOTE — Telephone Encounter (Signed)
Error

## 2012-05-12 ENCOUNTER — Other Ambulatory Visit: Payer: Self-pay

## 2012-05-12 MED ORDER — OMEPRAZOLE MAGNESIUM 20 MG PO TBEC: 20.0000 mg | DELAYED_RELEASE_TABLET | Freq: Two times a day (BID) | ORAL | Status: DC

## 2012-05-12 NOTE — Telephone Encounter (Signed)
Per patient request sent in Prilosec OTC 49m tablets, she says the capsules make her sick.

## 2012-05-15 ENCOUNTER — Other Ambulatory Visit: Payer: Self-pay

## 2012-05-15 ENCOUNTER — Telehealth: Payer: Self-pay

## 2012-05-15 DIAGNOSIS — R1310 Dysphagia, unspecified: Secondary | ICD-10-CM

## 2012-05-15 DIAGNOSIS — Z86010 Personal history of colonic polyps: Secondary | ICD-10-CM

## 2012-05-15 DIAGNOSIS — Z1211 Encounter for screening for malignant neoplasm of colon: Secondary | ICD-10-CM

## 2012-05-15 NOTE — Telephone Encounter (Signed)
Patient consulted with prior to setting up endo procedure times that would work for her.  We have her set up for EGD/Dil/Colon at [facility] 06/23/12 arrive at 8:00am for 9:15am procedures appointment.  She will come for her pre-visit Nov. 26th at 1:30pm.  Copy of Dr. Altheimer's note regarding her insulin pump given to pre-visit and also scanned in.

## 2012-06-17 ENCOUNTER — Ambulatory Visit (AMBULATORY_SURGERY_CENTER): Payer: BC Managed Care – PPO | Admitting: *Deleted

## 2012-06-17 ENCOUNTER — Encounter: Payer: Self-pay | Admitting: Internal Medicine

## 2012-06-17 VITALS — Ht 62.0 in | Wt 128.8 lb

## 2012-06-17 DIAGNOSIS — E1149 Type 2 diabetes mellitus with other diabetic neurological complication: Secondary | ICD-10-CM

## 2012-06-17 DIAGNOSIS — Z1211 Encounter for screening for malignant neoplasm of colon: Secondary | ICD-10-CM

## 2012-06-17 DIAGNOSIS — K219 Gastro-esophageal reflux disease without esophagitis: Secondary | ICD-10-CM

## 2012-06-17 MED ORDER — NA SULFATE-K SULFATE-MG SULF 17.5-3.13-1.6 GM/177ML PO SOLN: ORAL | Status: DC

## 2012-06-23 ENCOUNTER — Encounter (HOSPITAL_COMMUNITY)
Admission: RE | Disposition: A | Discharge status: Home / Self Care | Payer: Self-pay | Source: Ambulatory Visit | Attending: Internal Medicine

## 2012-06-23 ENCOUNTER — Ambulatory Visit (HOSPITAL_COMMUNITY)
Admission: RE | Admit: 2012-06-23 | Discharge: 2012-06-23 | Disposition: A | Discharge status: Home / Self Care | Payer: BC Managed Care – PPO | Source: Ambulatory Visit | Attending: Internal Medicine | Admitting: Internal Medicine

## 2012-06-23 ENCOUNTER — Encounter (HOSPITAL_COMMUNITY): Payer: Self-pay | Admitting: *Deleted

## 2012-06-23 DIAGNOSIS — K3184 Gastroparesis: Secondary | ICD-10-CM | POA: Insufficient documentation

## 2012-06-23 DIAGNOSIS — E785 Hyperlipidemia, unspecified: Secondary | ICD-10-CM | POA: Insufficient documentation

## 2012-06-23 DIAGNOSIS — D131 Benign neoplasm of stomach: Secondary | ICD-10-CM | POA: Diagnosis present

## 2012-06-23 DIAGNOSIS — Z860100 Personal history of colon polyps, unspecified: Secondary | ICD-10-CM | POA: Insufficient documentation

## 2012-06-23 DIAGNOSIS — Z1211 Encounter for screening for malignant neoplasm of colon: Secondary | ICD-10-CM

## 2012-06-23 DIAGNOSIS — E119 Type 2 diabetes mellitus without complications: Secondary | ICD-10-CM | POA: Insufficient documentation

## 2012-06-23 DIAGNOSIS — Z923 Personal history of irradiation: Secondary | ICD-10-CM | POA: Insufficient documentation

## 2012-06-23 DIAGNOSIS — Z09 Encounter for follow-up examination after completed treatment for conditions other than malignant neoplasm: Secondary | ICD-10-CM | POA: Insufficient documentation

## 2012-06-23 DIAGNOSIS — I10 Essential (primary) hypertension: Secondary | ICD-10-CM | POA: Insufficient documentation

## 2012-06-23 DIAGNOSIS — R1310 Dysphagia, unspecified: Secondary | ICD-10-CM | POA: Diagnosis present

## 2012-06-23 DIAGNOSIS — K219 Gastro-esophageal reflux disease without esophagitis: Secondary | ICD-10-CM | POA: Insufficient documentation

## 2012-06-23 DIAGNOSIS — Z8571 Personal history of Hodgkin lymphoma: Secondary | ICD-10-CM | POA: Insufficient documentation

## 2012-06-23 DIAGNOSIS — Z951 Presence of aortocoronary bypass graft: Secondary | ICD-10-CM | POA: Insufficient documentation

## 2012-06-23 DIAGNOSIS — K5730 Diverticulosis of large intestine without perforation or abscess without bleeding: Secondary | ICD-10-CM | POA: Insufficient documentation

## 2012-06-23 HISTORY — DX: Benign neoplasm of stomach: D13.1

## 2012-06-23 HISTORY — PX: COLONOSCOPY: SHX5424

## 2012-06-23 HISTORY — DX: Nausea with vomiting, unspecified: R11.2

## 2012-06-23 HISTORY — PX: MALONEY DILATION: SHX5535

## 2012-06-23 HISTORY — PX: ESOPHAGOGASTRODUODENOSCOPY: SHX5428

## 2012-06-23 HISTORY — DX: Other specified postprocedural states: Z98.890

## 2012-06-23 LAB — GLUCOSE, CAPILLARY
Glucose-Capillary: 116 mg/dL — ABNORMAL HIGH (ref 70–99)
Glucose-Capillary: 108 mg/dL — ABNORMAL HIGH (ref 70–99)

## 2012-06-23 SURGERY — EGD (ESOPHAGOGASTRODUODENOSCOPY)
Anesthesia: Moderate Sedation | Site: Mouth

## 2012-06-23 MED ORDER — FENTANYL CITRATE 0.05 MG/ML IJ SOLN
INTRAMUSCULAR | Status: AC
  Filled 2012-06-23: qty 2

## 2012-06-23 MED ORDER — MIDAZOLAM HCL 10 MG/2ML IJ SOLN
INTRAMUSCULAR | Status: DC | PRN
  Administered 2012-06-23 (×5): 2 mg via INTRAVENOUS

## 2012-06-23 MED ORDER — DIPHENHYDRAMINE HCL 50 MG/ML IJ SOLN
INTRAMUSCULAR | Status: AC
  Filled 2012-06-23: qty 1

## 2012-06-23 MED ORDER — SODIUM CHLORIDE 0.9 % IV SOLN
INTRAVENOUS | Status: DC
  Administered 2012-06-23: 500 mL via INTRAVENOUS

## 2012-06-23 MED ORDER — MIDAZOLAM HCL 10 MG/2ML IJ SOLN
INTRAMUSCULAR | Status: AC
  Filled 2012-06-23: qty 2

## 2012-06-23 MED ORDER — SODIUM CHLORIDE 0.9 % IV SOLN: INTRAVENOUS | Status: DC

## 2012-06-23 MED ORDER — FENTANYL CITRATE 0.05 MG/ML IJ SOLN
INTRAMUSCULAR | Status: DC | PRN
  Administered 2012-06-23 (×3): 25 ug via INTRAVENOUS

## 2012-06-23 NOTE — Op Note (Signed)
[facility] [place], [address]   COLONOSCOPY PROCEDURE REPORT  PATIENT: [name] [name], [name] [name]  MR#: [id number] BIRTHDATE: Jul 23, 1951 , 39  yrs. old GENDER: Female ENDOSCOPIST: Gatha Mayer, MD, [facility] PROCEDURE DATE:  06/23/2012 PROCEDURE:   Colonoscopy, diagnostic ASA CLASS:   Class III INDICATIONS:Screening and surveillance,personal history of colonic polyps. MEDICATIONS: There was residual sedation effect present from prior procedure, Fentanyl 25 mcg IV, and Versed 4 mg IV  DESCRIPTION OF PROCEDURE:   After the risks benefits and alternatives of the procedure were thoroughly explained, informed consent was obtained.  A digital rectal exam revealed no abnormalities of the rectum.   The [id number])  endoscope was introduced through the anus and advanced to the cecum, which was identified by both the appendix and ileocecal valve. No adverse events experienced.   The quality of the prep was Suprep excellent The instrument was then slowly withdrawn as the colon was fully examined.      COLON FINDINGS: There was mild scattered diverticulosis noted in the sigmoid colon.   The colon mucosa was otherwise normal. Retroflexed views revealed no abnormalities. The time to cecum=5 minutes 0 seconds.  Withdrawal time=9 minutes 0 seconds.  The scope was withdrawn and the procedure completed. COMPLICATIONS: There were no complications.  ENDOSCOPIC IMPRESSION: 1.   There was mild diverticulosis noted in the sigmoid colon 2.   The colon mucosa was otherwise normal with excellent prep 3.   Prior adenomas 2005 and 2008 - repeat colonoscopy in  5 years - 06/2017   eSigned:  Gatha Mayer, MD, [facility] 06/23/2012 10:46 AM   cc: The Patient, Lorne Skeens, MD, Evlyn Clines, MD, Raynelle Bring, MD, Yehuda Savannah, MD, and [name], MD

## 2012-06-23 NOTE — Op Note (Signed)
[facility] [place], [address]   ENDOSCOPY PROCEDURE REPORT  PATIENT: [name], [name]  MR#: [id number] BIRTHDATE: 09-16-1950 , 33  yrs. old GENDER: Female ENDOSCOPIST: Gatha Mayer, MD, [facility] PROCEDURE DATE:  06/23/2012 PROCEDURE:  EGD w/ biopsy and Maloney dilation of esophagus ASA CLASS:     Class III INDICATIONS:  Dysphagia.   history of GERD. MEDICATIONS: Fentanyl 50 mcg IV and Versed 6 mg IV TOPICAL ANESTHETIC: Cetacaine Spray  DESCRIPTION OF PROCEDURE: After the risks benefits and alternatives of the procedure were thoroughly explained, informed consent was obtained.  The Pentax Gastroscope [id number] endoscope was introduced through the mouth and advanced to the second portion of the duodenum. Without limitations.  The instrument was slowly withdrawn as the mucosa was fully examined.        STOMACH: A polypoid shaped sessile polyp measuring 6-7 mm in size was found in the gastric antrum.  Multiple biopsies was performed using cold forceps.  Sample sent for histology.  The remainder of the upper endoscopy exam was otherwise normal. Retroflexed views revealed no abnormalities.     The scope was then withdrawn from the patient, a 41 [place] Maloney dilator was passed with difficulty or heme (mild resistance) and the procedure completed.  COMPLICATIONS: There were no complications. ENDOSCOPIC IMPRESSION: 1.   Sessile polyp measuring 6-7 mm in size was found in the gastric antrum - biopsied 2.   The remainder of the upper endoscopy exam was otherwise normal  RECOMMENDATIONS: 1.  Clear liquids until 1130, then soft foods rest of day.  Resume prior diet tomorrow. 2.  Await pathology results of polyp 3.   Colonoscopy next   eSigned:  Gatha Mayer, MD, [facility] 06/23/2012 10:36 AM   NF:[id number] Orson Ape, MD, The Patient, Lorne Skeens, MD, Raynelle Bring, MD, Yehuda Savannah, MD, and Evlyn Clines, MD

## 2012-06-23 NOTE — H&P (Signed)
Cc:  Dysphagia, reflux, gastroparesis. Also with history of colon polyps   HPI:  Pill dysphagia problems. Reflux improved with lifestyle changes and nocturnal PPI. No colon symptoms.  Allergies  Allergen Reactions  . Clindamycin/Lincomycin Shortness Of Breath and Swelling  . Gentamycin (Gentamicin) Hives and Itching    Reaction noted post loop recorder implant, where gentamycin was used for irrigation.  . Atorvastatin     Muscle and joint pain  . Avandia (Rosiglitazone Maleate)     Congestive heart failure  . Codeine Nausea And Vomiting  . Erythromycin Nausea And Vomiting  . Iodinated Diagnostic Agents     Vessel pain  . Metformin     Congestive heart failure  . Simvastatin     Increased LFT's  . Cephalexin Swelling and Rash  . Neosporin (Neomycin-Bacitracin Zn-Polymyx) Rash  . Penicillins Swelling and Rash   @ENCMEDSTART @ Past Medical History  Diagnosis Date  . Aortic stenosis      bicuspid aortic valve; mean gradient of 20 mmHg in 2/10; aortic valve   replacement surgery(19-mm Edwards pericardial valve) with a redo coronary artery bypass graft procedure      in 07/2009; postoperative Dressler's syndrome    . Coronary artery disease     status post RCA saphenous vein graft in 06/1999; multiple preceding PCIs with restenosis; total obstruction of RCA in 2006 with patent graft   . CHF (congestive heart failure)     History of CHF; recurred following AVR/CABG surgery; intermittent PND  . Hyperlipidemia   . Hypertension   . Small bowel obstruction     Recurrent; resolved after resection of a lipoma  . Osteopenia      hip on DEXA in October 2007.  Marland Kitchen Anemia   . Carcinoma, renal cell 05/2007    Laparoscopic right nephrectomy  . NASH (nonalcoholic steatohepatitis) 1999    -biopsy in 1999  . Diabetes mellitus 03/2010     TYPE II: Hemoglobin A1c of 7.4 in  03/2010; 8.4 in 06/2010; treated with insulin pump  . Migraines   . Duodenal ulcer     Remote; H. pylori positive  .  Colitis, ischemic 2008  . Gastroesophageal reflux disease     ? of GASTROPARESIS; IRRITABLE BOWEL SYNDROME  . Hodgkin's disease 1991    Mantle radiation therapy  . Exertional dyspnea     chest discomfort  . Statin intolerance   . Hypercalcemia     Ca-11.1 in 07/2011  . Vitamin D deficiency   . Costochondritis   . Insomnia   . Anemia   . Gastroparesis due to DM 05/01/2012  . PONV (postoperative nausea and vomiting)    Past Surgical History  Procedure Date  . Total abdominal hysterectomy w/ bilateral salpingoophorectomy 2000  . Breast excisional biopsy     benign, bilateral  . Nephrectomy 2008    Laparoscopic right nephrectomy, renal cell carcinoma  . Coronary artery bypass graft 2005, 2011    CABG-most recent SVG to RCA-12/05; RCA occluded with patent graft in 2006; Redo bypass in 07/2009  . Tumor excision 1981    removal of Hodgkins lymphoma  . Exploratory laparotomy w/ bowel resection     Small bowel resection of lipoma  . Aortic valve replacement 2011    Aortic valve replacement surgery with a 19 mm bioprosthetic device post redo CABG January 2011.  . Colonoscopy     Multiple with adenomatous polyps  . Cervical biopsy   . Liver biopsy   . Bone marrow biopsy   .  Esophagogastroduodenoscopy    History   Social History  . Marital Status: Married    Spouse Name: N/A    Number of Children: 2  . Years of Education: N/A   Occupational History  . full time     elementary principal   Social History Main Topics  . Smoking status: Never Smoker   . Smokeless tobacco: Never Used  . Alcohol Use: No  . Drug Use: No  . Sexually Active: None   Other Topics Concern  . None   Social History Narrative   School principal, marriedStarted disability September 2013   Family History  Problem Relation Age of Onset  . Heart attack Mother   . Heart attack Father   . Diabetes Father   . Hypertension Brother   . Glaucoma Brother   . Lung cancer Sister     meta  . Ovarian  cancer Sister   . Breast cancer Sister   . Colon cancer Neg Hx   . Stomach cancer Neg Hx       PE:  General:  NAD Eyes:   anicteric Lungs:  clear Heart:  S1S2 no rubs, murmurs or gallops Abdomen:  soft and nontender, BS+, RLQ incisional hernia Ext:   no edema  Ass/Plan:  1. EGD and possible esophageal dilation for dysphagia, GERD and gastroparesis 2. Colonoscopy for personal hx colon polyps The risks and benefits as well as alternatives of endoscopic procedure(s) have been discussed and reviewed. All questions answered. The patient agrees to proceed

## 2012-06-24 ENCOUNTER — Encounter (HOSPITAL_COMMUNITY): Payer: Self-pay

## 2012-06-24 ENCOUNTER — Encounter (HOSPITAL_COMMUNITY): Payer: Self-pay | Admitting: Internal Medicine

## 2012-06-24 ENCOUNTER — Encounter: Payer: Self-pay | Admitting: Internal Medicine

## 2012-06-28 ENCOUNTER — Other Ambulatory Visit: Payer: Self-pay | Admitting: Cardiology

## 2012-07-11 ENCOUNTER — Ambulatory Visit (HOSPITAL_COMMUNITY)
Admission: RE | Admit: 2012-07-11 | Discharge: 2012-07-11 | Disposition: A | Discharge status: Home / Self Care | Payer: BC Managed Care – PPO | Source: Ambulatory Visit | Attending: Urology | Admitting: Urology

## 2012-07-11 ENCOUNTER — Other Ambulatory Visit (HOSPITAL_COMMUNITY): Payer: Self-pay | Admitting: Urology

## 2012-07-11 DIAGNOSIS — C649 Malignant neoplasm of unspecified kidney, except renal pelvis: Secondary | ICD-10-CM | POA: Insufficient documentation

## 2012-07-12 ENCOUNTER — Telehealth: Payer: Self-pay | Admitting: Physician Assistant

## 2012-07-12 NOTE — Telephone Encounter (Signed)
Patient called re: episode of palpitations described as "heart beating in my throat" last night for ~ 2-3 hours with associated presyncope after bending over. Palpitations typically occur after bending over, but improve after 30 sec to a couple minutes. BP 109/66, HR 110 at the time. She was cleaning throughout the day anticipating the arrival of family for the holidays. She laid down to rest hoping they would improve. She became lightheaded while laying, then soon after, her palpitations and symptoms resolved. She did not lose consciousness. No chest pain or SOB. She did press a button of the remote card for her loop recorder. No further episodes. States this is the first episode since the device was implanted. Would like to monitor symptoms throughout the weekend. Advised to present to the ED for device interrogation and formal evaluation if symptoms continue or worsen. Will call the [place] office for device interrogation on Monday. Cannot interpret underlying rhythm over the phone, but may be mediated by stress and mild dehydration (postural orthostatic tachycardia bending over). In the meantime, advised to drink plenty of fluids and manage stress appropriately while family in town. She understood and agreed.    Jacquelynn Cree, PA-C 07/12/2012 8:23 AM

## 2012-07-14 ENCOUNTER — Encounter: Payer: Self-pay | Admitting: Internal Medicine

## 2012-07-14 ENCOUNTER — Ambulatory Visit (INDEPENDENT_AMBULATORY_CARE_PROVIDER_SITE_OTHER): Payer: BC Managed Care – PPO | Admitting: *Deleted

## 2012-07-14 DIAGNOSIS — R002 Palpitations: Secondary | ICD-10-CM

## 2012-07-14 NOTE — Progress Notes (Signed)
Patient seen for palpitations over the weekend.    Interrogation of loop recorder demonstrates likely atrial flutter with rates up to 150bpm and up to 3.5 second pauses.  Pauses all occur during the night while patient not awake.  EKG done today demonstrates sinus rhythm with 1st degree AV block- unchanged from previous.  Discussed with Dr Caryl Comes.  Patient to return next Tuesday, December 31st at [facility] to see Dr Lovena Le.  Patient to call back with problems between now and then.  Patient aware and agrees with plan.   Chanetta Marshall, Therapist, sports, BSN

## 2012-07-21 ENCOUNTER — Ambulatory Visit (INDEPENDENT_AMBULATORY_CARE_PROVIDER_SITE_OTHER): Payer: BC Managed Care – PPO | Admitting: Cardiology

## 2012-07-21 ENCOUNTER — Other Ambulatory Visit: Payer: Self-pay | Admitting: Cardiology

## 2012-07-21 ENCOUNTER — Encounter: Payer: Self-pay | Admitting: Cardiology

## 2012-07-21 VITALS — BP 118/68 | HR 84 | Ht 62.0 in | Wt 127.4 lb

## 2012-07-21 DIAGNOSIS — D649 Anemia, unspecified: Secondary | ICD-10-CM

## 2012-07-21 DIAGNOSIS — I509 Heart failure, unspecified: Secondary | ICD-10-CM

## 2012-07-21 DIAGNOSIS — I10 Essential (primary) hypertension: Secondary | ICD-10-CM

## 2012-07-21 DIAGNOSIS — K219 Gastro-esophageal reflux disease without esophagitis: Secondary | ICD-10-CM

## 2012-07-21 DIAGNOSIS — I495 Sick sinus syndrome: Secondary | ICD-10-CM

## 2012-07-21 NOTE — Assessment & Plan Note (Signed)
Normal CBC in 01/2012.  Anemia resolved.

## 2012-07-21 NOTE — Patient Instructions (Addendum)
Your physician recommends that you schedule a follow-up appointment in: 6 months

## 2012-07-21 NOTE — Assessment & Plan Note (Signed)
Paroxysmal atrial fibrillation with rapid ventricular response and sinus pauses upon conversion to normal sinus rhythm.  Scheduled for evaluation by Dr. Lovena Le on 12/31 and will likely require permanent pacing.  She is refraining from driving at present.  The importance of avoiding falls should she experience recurrent lightheadedness or syncope was discussed with her and her husband.

## 2012-07-21 NOTE — Progress Notes (Deleted)
Name: [name] [name]    DOB: 12-07-1950  Age: 61 y.o.  MR#: [id number]       PCP:  Leonides Grills, MD      Insurance: @PAYORNAME @   CC:   No chief complaint on file.  MEDICATION LIST TCS 06/23/12 LOOP RECORDER INTERROGATION IN EPIC APPT WITH DR Lovena Le TOMORROW  VS BP 118/68  Pulse 84  Ht 5' 2"  (1.575 m)  Wt 127 lb 6.4 oz (57.788 kg)  BMI 23.30 kg/m2  SpO2 96%  Weights Current Weight  07/21/12 127 lb 6.4 oz (57.788 kg)  06/23/12 128 lb (58.06 kg)  06/23/12 128 lb (58.06 kg)    Blood Pressure  BP Readings from Last 3 Encounters:  07/21/12 118/68  06/23/12 120/63  06/23/12 120/63     Admit date:  (Not on file) Last encounter with RMR:  06/28/2012   Allergy Allergies  Allergen Reactions  . Clindamycin/Lincomycin Shortness Of Breath and Swelling  . Gentamycin (Gentamicin) Hives and Itching    Reaction noted post loop recorder implant, where gentamycin was used for irrigation.  . Atorvastatin     Muscle and joint pain  . Avandia (Rosiglitazone Maleate)     Congestive heart failure  . Codeine Nausea And Vomiting  . Erythromycin Nausea And Vomiting  . Iodinated Diagnostic Agents     Vessel pain  . Metformin     Congestive heart failure  . Simvastatin     Increased LFT's  . Cephalexin Swelling and Rash  . Neosporin (Neomycin-Bacitracin Zn-Polymyx) Rash  . Penicillins Swelling and Rash    Current Outpatient Prescriptions  Medication Sig Dispense Refill  . ALPRAZolam (XANAX) 0.5 MG tablet Take 0.25 mg by mouth at bedtime.       Marland Kitchen aspirin 81 MG tablet Take 81 mg by mouth daily.        . Calcium Carbonate-Vitamin D (RA CALCIUM PLUS VITAMIN D) 600-400 MG-UNIT per tablet Take 1 tablet by mouth 2 (two) times daily.       . cholecalciferol (VITAMIN D) 1000 UNITS tablet Take 1,000 Units by mouth daily.        . Coenzyme Q10 (COQ10 PO) Take 600 mg by mouth daily.       . diclofenac sodium (VOLTAREN) 1 % GEL Apply topically 2 (two) times daily.      Marland Kitchen ezetimibe (ZETIA)  10 MG tablet Take 1 tablet (10 mg total) by mouth daily.  90 tablet  3  . fenofibrate (TRICOR) 145 MG tablet Take 145 mg by mouth daily.      . ferrous fumarate (FERRO-SEQUELS) 50 MG CR tablet Take 50 mg by mouth daily.      . fluticasone (FLONASE) 50 MCG/ACT nasal spray Place 2 sprays into the nose 3 times/day as needed-between meals & bedtime.       . furosemide (LASIX) 40 MG tablet Take 40 mg by mouth as needed.       . furosemide (LASIX) 80 MG tablet Take 80 mg by mouth daily.       . insulin glulisine (APIDRA) 100 UNIT/ML injection Inject into the skin daily. Sliding scale (insulin pump)      . metoprolol tartrate (LOPRESSOR) 25 MG tablet TAKE ONE TABLET TWICE DAILY  180 tablet  1  . Multiple Vitamins-Minerals (MULTIVITAMIN WITH MINERALS) tablet Take 1 tablet by mouth daily.        . nitroGLYCERIN (NITROLINGUAL) 0.4 MG/SPRAY spray Place 1 spray under the tongue every 5 (five) minutes as needed.        Marland Kitchen  omeprazole (PRILOSEC) 20 MG capsule Take 1 capsule (20 mg total) by mouth 2 (two) times daily before a meal. 30 minutes before breakfast and supper  60 capsule  11  . polyethylene glycol powder (GLYCOLAX/MIRALAX) powder Take 17 g by mouth daily. 1/2 - 1 dose every day.      . potassium chloride SA (KLOR-CON M20) 20 MEQ tablet Take 40 mEq by mouth daily.       . pravastatin (PRAVACHOL) 10 MG tablet TAKE ONE (1) TABLET BY MOUTH EVERY      DAY  90 tablet  0  . sitaGLIPtan (JANUVIA) 100 MG tablet Take 100 mg by mouth daily.        . vitamin C (ASCORBIC ACID) 500 MG tablet Take 500 mg by mouth daily.        Discontinued Meds:    Medications Discontinued During This Encounter  Medication Reason  . OMEGA-3 KRILL OIL 300 MG CAPS Error    Patient Active Problem List  Diagnosis  . Hyperlipidemia  . Hx of adenomatous colonic polyps  . Hypertension  . Small bowel obstruction  . CHF (congestive heart failure)  . Aortic stenosis status post 19 mm Edwards pericardial valve replacement 2011  .  Coronary artery disease  . Osteopenia  . Anemia  . NASH (nonalcoholic steatohepatitis)  . History of Carcinoma, renal cell  . Diabetes mellitus type 2, controlled  . History of Duodenal ulcer  . History of Colitis, ischemic  . Gastroesophageal reflux disease  . Hodgkin's disease  . Cholelithiasis  . Fatigue  . Hypercalcemia  . Near syncope  . Exertional dyspnea  . Gastroparesis due to DM  . Hyperplastic gastric polyp  . Dysphagia    LABS Admission on 06/23/2012, Discharged on 06/23/2012  Component Date Value  . Glucose-Capillary 06/23/2012 116*  . Glucose-Capillary 06/23/2012 108*     Results for this Opt Visit:     Results for orders placed during the hospital encounter of 06/23/12  GLUCOSE, CAPILLARY      Component Value Range   Glucose-Capillary 116 (*) 70 - 99 mg/dL  GLUCOSE, CAPILLARY      Component Value Range   Glucose-Capillary 108 (*) 70 - 99 mg/dL    EKG Orders placed in visit on 07/14/12  . EKG 12-LEAD     Prior Assessment and Plan Problem List as of 07/21/2012            Cardiology Problems   Hyperlipidemia   Last Assessment & Plan Note   09/19/2011 Office Visit Addendum 09/23/2011  8:02 AM by Yehuda Savannah, MD    Most recent lipid profile is suboptimal, but patient is intolerant of high-dose statin therapy.  I have suspected that coronary disease is related to radiation therapy rather than ASCVD.  She has had single vessel disease without apparent progression over the past 10 years.  I think it likely that she will not be troubled by coronary disease in the future regardless of the adequacy of control of hyperlipidemia.  Nonetheless, we will use alternative medication to lower her cholesterol to the extent possible.    Hypertension   Last Assessment & Plan Note   12/21/2011 Office Visit Signed 12/21/2011  6:05 PM by Yehuda Savannah, MD    Systolics all below 130 this month and diastolics below 80 representing adequate control of hypertension.   There was no orthostatic change when measured at this visit.  Her symptoms of lightheadedness are not clearly related to hypotension.  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    Aortic stenosis status post 19 mm Edwards pericardial valve replacement 2011   Last Assessment & Plan Note   12/06/2011 Office Visit Addendum 12/09/2011 10:45 PM by Yehuda Savannah, MD    Patient required a small prosthetic valve and likely has a component of valve-patient mismatch.  Valve sounds are crisp without evidence for thrombosis or dysfunction.    Coronary artery disease   Last Assessment & Plan Note   03/12/2012 Office Visit Signed 03/12/2012 10:01 AM by Evans Lance, MD    She denies anginal symptoms. I've encouraged the patient to increase her physical activity and continue her current medical therapy.    Near syncope   Last Assessment & Plan Note   03/12/2012 Office Visit Signed 03/12/2012 10:01 AM by Evans Lance, MD    She has had no recurrent syncope. Plan is to patient back in several months.      Other   Hodgkin's disease   Hx of adenomatous colonic polyps   Small bowel obstruction   Osteopenia   Last Assessment & Plan Note   09/19/2011 Office Visit Signed 09/19/2011  4:41 PM by Yehuda Savannah, MD    Osteopenia could be related to borderline hyperparathyroidism.  Bone densitometry was last performed 2 years ago.  A repeat study would be of interest.    Anemia   Last Assessment & Plan Note   12/06/2011 Office Visit Signed 12/06/2011 12:51 PM by Yehuda Savannah, MD    Anemia is minimal at this point without a clear etiology.    NASH (nonalcoholic steatohepatitis)   Last Assessment & Plan Note    09/19/2011 Office Visit Signed 09/19/2011  4:40 PM by Yehuda Savannah, MD    LFTs have been minimally abnormal on recent determinations, but have returned to normal as of testing in 08/2011.    History of Carcinoma, renal cell   Diabetes mellitus type 2, controlled   Last Assessment & Plan Note   03/06/2012 Office Visit Signed 03/09/2012 10:40 PM by Yehuda Savannah, MD    Insulin requirement has been much higher recently than in the past, but diabetic control remains suboptimal.  Endocrinology is working very effectively and closely with Ms. Glantz to optimize treatment.    History of Duodenal ulcer   History of Colitis, ischemic   Gastroesophageal reflux disease   Cholelithiasis   Fatigue   Last Assessment & Plan Note   08/13/2011 Office Visit Addendum 08/15/2011 11:03 PM by Yehuda Savannah, MD    No apparent reason for patient's recent deterioration.  TSH was normal earlier this month.  CBC shows stable mild chronic anemia at a level that would not be expected to cause the symptoms.  This would be an unusual reaction to an ACE inhibitor, but since renal function has deteriorated, that medication will be stopped.  She has also developed more significant hypercalcemia with a recent level of 11.1.  This apparently is due to deterioration in renal dysfunction perhaps related to ACE inhibitor.  A value will be rechecked in one month.    Hypercalcemia   Last Assessment & Plan Note   09/19/2011 Office Visit Addendum 09/23/2011  8:01 AM by Cristopher Estimable  Rothbart, MD    Calcium level has decreased from a peak of 11.1 to 10.6.  Dr. Eden Emms has reviewed laboratory studies and advised patient that there is inadequate evidence for true hyperparathyroidism.  She does appear to have at least a borderline abnormality with a low normal PTH level.  Borderline hypercalcemia was likely exacerbated by administration of vitamin D and calcium plus a degree of acute renal failure associated with ACE inhibitor.     Exertional dyspnea   Gastroparesis due to DM   Hyperplastic gastric polyp   Dysphagia       Imaging: Dg Chest 2 View  07/11/2012  *RADIOLOGY REPORT*  Clinical Data:  Malignant neoplasm kidney  CHEST - 2 VIEW  Comparison: 11/30/2011  Findings: Cardiomediastinal silhouette is stable.  Again noted status post median sternotomy and cardiac valve replacement.  No acute infiltrate or pleural effusion.  No pulmonary edema.  Bony thorax is stable.  IMPRESSION: No active disease.  No significant change.   Original Report Authenticated By: Lahoma Crocker, M.D.      [facility] Calculation: Score not calculated. Missing: Total Cholesterol

## 2012-07-21 NOTE — Assessment & Plan Note (Signed)
CHF has been compensated with current medical regime.  We will continue to monitor renal function and serum electrolytes.

## 2012-07-21 NOTE — Assessment & Plan Note (Signed)
Only a single elevated systolic blood pressure of 160 recorded over the past year with all diastolics normal.  Hypertension is under excellent control.

## 2012-07-21 NOTE — Progress Notes (Signed)
Patient ID: [name] [name], female   DOB: January 15, 1951, 61 y.o.   MRN: [id number]  HPI: Scheduled return visit for this nice woman with a long history of coronary disease and diastolic dysfunction, likely related to previous radiation therapy for Hodgkin's disease.  Since her last visit, she has been told that her event recorder, which has been in place since 11/2011, has revealed atrial fibrillation at a rate of approximately 150 bpm plus post conversion pauses of up to 4 seconds.  These have been associated with lightheadedness but no recurrent loss of consciousness.  She notes spells of atrial fibrillation and severe palpitations.  These have previously lasted for seconds to minutes, but a two-hour spell was recently recorded.  She is scheduled for evaluation by Dr. Lovena Le tomorrow.  She stopped working some months ago and has noted a marked improvement in her health status.  She feels better and is more active.  Insulin requirement has decreased dramatically, and control of diabetes has improved.  Except as associated with palpitations, she has not noted much in the way of dyspnea and has had no chest pain.  Prior to Admission medications   Medication Sig Start Date End Date Taking? Authorizing Provider  ALPRAZolam Duanne Moron) 0.5 MG tablet Take 0.25 mg by mouth at bedtime.    Yes Historical Provider, MD  aspirin 81 MG tablet Take 81 mg by mouth daily.     Yes Historical Provider, MD  Calcium Carbonate-Vitamin D (RA CALCIUM PLUS VITAMIN D) 600-400 MG-UNIT per tablet Take 1 tablet by mouth 2 (two) times daily.    Yes Historical Provider, MD  cholecalciferol (VITAMIN D) 1000 UNITS tablet Take 1,000 Units by mouth daily.     Yes Historical Provider, MD  Coenzyme Q10 (COQ10 PO) Take 600 mg by mouth daily.    Yes Historical Provider, MD  diclofenac sodium (VOLTAREN) 1 % GEL Apply topically 2 (two) times daily.   Yes Historical Provider, MD  ezetimibe (ZETIA) 10 MG tablet Take 1 tablet (10 mg total) by mouth  daily. 05/05/12  Yes Yehuda Savannah, MD  fenofibrate (TRICOR) 145 MG tablet Take 145 mg by mouth daily.   Yes Historical Provider, MD  ferrous fumarate (FERRO-SEQUELS) 50 MG CR tablet Take 50 mg by mouth daily.   Yes Historical Provider, MD  fluticasone (FLONASE) 50 MCG/ACT nasal spray Place 2 sprays into the nose 3 times/day as needed-between meals & bedtime.    Yes Historical Provider, MD  furosemide (LASIX) 40 MG tablet Take 40 mg by mouth as needed.  12/28/11  Yes Yehuda Savannah, MD  furosemide (LASIX) 80 MG tablet Take 80 mg by mouth daily.  11/23/11  Yes Yehuda Savannah, MD  insulin glulisine (APIDRA) 100 UNIT/ML injection Inject into the skin daily. Sliding scale (insulin pump)   Yes Historical Provider, MD  metoprolol tartrate (LOPRESSOR) 25 MG tablet TAKE ONE TABLET TWICE DAILY 10/30/11  Yes Yehuda Savannah, MD  Multiple Vitamins-Minerals (MULTIVITAMIN WITH MINERALS) tablet Take 1 tablet by mouth daily.     Yes Historical Provider, MD  nitroGLYCERIN (NITROLINGUAL) 0.4 MG/SPRAY spray Place 1 spray under the tongue every 5 (five) minutes as needed.     Yes Historical Provider, MD  omeprazole (PRILOSEC) 20 MG capsule Take 1 capsule (20 mg total) by mouth 2 (two) times daily before a meal. 30 minutes before breakfast and supper 05/01/12  Yes Gatha Mayer, MD  polyethylene glycol powder (GLYCOLAX/MIRALAX) powder Take 17 g by mouth daily. 1/2 - 1  dose every day.   Yes Historical Provider, MD  potassium chloride SA (KLOR-CON M20) 20 MEQ tablet Take 40 mEq by mouth daily.  11/23/11  Yes Yehuda Savannah, MD  pravastatin (PRAVACHOL) 10 MG tablet TAKE ONE (1) TABLET BY MOUTH EVERY      DAY 06/28/12  Yes Yehuda Savannah, MD  sitaGLIPtan (JANUVIA) 100 MG tablet Take 100 mg by mouth daily.     Yes Historical Provider, MD  vitamin C (ASCORBIC ACID) 500 MG tablet Take 500 mg by mouth daily.   Yes Historical Provider, MD  KLOR-CON M20 20 MEQ tablet TAKE TWO TABLETS BY MOUTH DAILY 07/21/12   Yehuda Savannah, MD   Allergies  Allergen Reactions  . Clindamycin/Lincomycin Shortness Of Breath and Swelling  . Gentamycin (Gentamicin) Hives and Itching    Reaction noted post loop recorder implant, where gentamycin was used for irrigation.  . Atorvastatin     Muscle and joint pain  . Avandia (Rosiglitazone Maleate)     Congestive heart failure  . Codeine Nausea And Vomiting  . Erythromycin Nausea And Vomiting  . Iodinated Diagnostic Agents     Vessel pain  . Metformin     Congestive heart failure  . Simvastatin     Increased LFT's  . Cephalexin Swelling and Rash  . Neosporin (Neomycin-Bacitracin Zn-Polymyx) Rash  . Penicillins Swelling and Rash  Past medical history, social history, and family history reviewed and updated.  ROS: Denies edema, weight gain, orthopnea or PND.  All other systems reviewed and are negative.  PHYSICAL EXAM: BP 118/68  Pulse 84  Ht 5' 2"  (1.575 m)  Wt 57.788 kg (127 lb 6.4 oz)  BMI 23.30 kg/m2  SpO2 96%  General-Well developed; no acute distress Body habitus-proportionate weight and height Neck-No JVD; no carotid bruits Lungs-clear lung fields; resonant to percussion Cardiovascular-normal PMI; normal S1 and S2; grade 2-3/6 early peaking systolic ejection murmur at the cardiac base Abdomen-normal bowel sounds; soft and non-tender without masses or organomegaly Musculoskeletal-No deformities, no cyanosis or clubbing Neurologic-Normal cranial nerves; symmetric strength and tone Skin-Warm, no significant lesions Extremities-2+ posterior tibial pulses but somewhat decreased dorsalis pedis bilaterally; no edema  ASSESSMENT AND PLAN:  [name], MD 07/21/2012 12:36 PM

## 2012-07-22 ENCOUNTER — Encounter: Payer: Self-pay | Admitting: Internal Medicine

## 2012-07-22 ENCOUNTER — Ambulatory Visit (INDEPENDENT_AMBULATORY_CARE_PROVIDER_SITE_OTHER): Payer: BC Managed Care – PPO | Admitting: Internal Medicine

## 2012-07-22 VITALS — BP 144/61 | HR 92 | Ht 63.0 in | Wt 128.0 lb

## 2012-07-22 DIAGNOSIS — I4892 Unspecified atrial flutter: Secondary | ICD-10-CM

## 2012-07-22 DIAGNOSIS — R55 Syncope and collapse: Secondary | ICD-10-CM

## 2012-07-23 ENCOUNTER — Encounter: Payer: Self-pay | Admitting: Internal Medicine

## 2012-07-23 DIAGNOSIS — I4892 Unspecified atrial flutter: Secondary | ICD-10-CM | POA: Insufficient documentation

## 2012-07-23 NOTE — Progress Notes (Signed)
HPI [name] [name] returns today for followup. She is a pleasant 62 yo woman with a h/o CAD and Aortic valve disease, s/p CABG and AVR. She has had recurrent syncope. She is s/p ILR and interogation of her device demonstrated atrial flutter with a post termination of 4 seconds. She has actually had a couple of episodes. She denies chest pain. She has noted palpitations prior to other episodes.  Allergies  Allergen Reactions  . Clindamycin/Lincomycin Shortness Of Breath and Swelling  . Gentamycin (Gentamicin) Hives and Itching    Reaction noted post loop recorder implant, where gentamycin was used for irrigation.  . Atorvastatin     Muscle and joint pain  . Avandia (Rosiglitazone Maleate)     Congestive heart failure  . Codeine Nausea And Vomiting  . Erythromycin Nausea And Vomiting  . Iodinated Diagnostic Agents     Vessel pain  . Metformin     Congestive heart failure  . Simvastatin     Increased LFT's  . Cephalexin Swelling and Rash  . Neosporin (Neomycin-Bacitracin Zn-Polymyx) Rash  . Penicillins Swelling and Rash     Current Outpatient Prescriptions  Medication Sig Dispense Refill  . ALPRAZolam (XANAX) 0.5 MG tablet Take 0.25 mg by mouth at bedtime.       Marland Kitchen aspirin 81 MG tablet Take 81 mg by mouth daily.        . Calcium Carbonate-Vitamin D (RA CALCIUM PLUS VITAMIN D) 600-400 MG-UNIT per tablet Take 1 tablet by mouth 2 (two) times daily.       . cholecalciferol (VITAMIN D) 1000 UNITS tablet Take 1,000 Units by mouth daily.        . Coenzyme Q10 (COQ10 PO) Take 600 mg by mouth daily.       . diclofenac sodium (VOLTAREN) 1 % GEL Apply topically 2 (two) times daily.      Marland Kitchen ezetimibe (ZETIA) 10 MG tablet Take 1 tablet (10 mg total) by mouth daily.  90 tablet  3  . fenofibrate (TRICOR) 145 MG tablet Take 145 mg by mouth daily.      . ferrous fumarate (FERRO-SEQUELS) 50 MG CR tablet Take 50 mg by mouth daily.      . fluticasone (FLONASE) 50 MCG/ACT nasal spray Place 2 sprays into the  nose 3 times/day as needed-between meals & bedtime.       . furosemide (LASIX) 40 MG tablet Take 40 mg by mouth as needed.       . furosemide (LASIX) 80 MG tablet Take 80 mg by mouth daily.       . insulin glulisine (APIDRA) 100 UNIT/ML injection Inject into the skin daily. Sliding scale (insulin pump)      . KLOR-CON M20 20 MEQ tablet TAKE TWO TABLETS BY MOUTH DAILY  60 tablet  11  . metoprolol tartrate (LOPRESSOR) 25 MG tablet TAKE ONE TABLET TWICE DAILY  180 tablet  1  . Multiple Vitamins-Minerals (MULTIVITAMIN WITH MINERALS) tablet Take 1 tablet by mouth daily.        . nitroGLYCERIN (NITROLINGUAL) 0.4 MG/SPRAY spray Place 1 spray under the tongue every 5 (five) minutes as needed.        Marland Kitchen omeprazole (PRILOSEC) 20 MG capsule Take 20 mg by mouth as needed. 30 minutes before breakfast and supper      . polyethylene glycol powder (GLYCOLAX/MIRALAX) powder Take 17 g by mouth daily. 1/2 - 1 dose every day.      . potassium chloride SA (KLOR-CON M20) 20 MEQ tablet  Take 40 mEq by mouth daily.       . pravastatin (PRAVACHOL) 10 MG tablet TAKE ONE (1) TABLET BY MOUTH EVERY      DAY  90 tablet  0  . sitaGLIPtan (JANUVIA) 100 MG tablet Take 100 mg by mouth daily.        . vitamin C (ASCORBIC ACID) 500 MG tablet Take 500 mg by mouth daily.         Past Medical History  Diagnosis Date  . Aortic stenosis      bicuspid aortic valve; mean gradient of 20 mmHg in 2/10; aortic valve   replacement surgery(19-mm Edwards pericardial valve) with a redo coronary artery bypass graft procedure      in 07/2009; postoperative Dressler's syndrome    . Coronary artery disease     status post RCA saphenous vein graft in 06/1999; multiple preceding PCIs with restenosis; total obstruction of RCA in 2006 with patent graft   . CHF (congestive heart failure)     History of CHF; recurred following AVR/CABG surgery; intermittent PND  . Hyperlipidemia   . Hypertension   . Small bowel obstruction     Recurrent; resolved  after resection of a lipoma  . Osteopenia      hip on DEXA in October 2007.  Marland Kitchen Anemia   . Carcinoma, renal cell 05/2007    Laparoscopic right nephrectomy  . NASH (nonalcoholic steatohepatitis) 1999    -biopsy in 1999  . Diabetes mellitus 03/2010     TYPE II: Hemoglobin A1c of 7.4 in  03/2010; 8.4 in 06/2010; treated with insulin pump  . Migraines   . Duodenal ulcer     Remote; H. pylori positive  . Colitis, ischemic 2008  . Gastroesophageal reflux disease     ? of GASTROPARESIS; IRRITABLE BOWEL SYNDROME  . Hodgkin's disease 1991    Mantle radiation therapy  . Exertional dyspnea     chest discomfort  . Statin intolerance   . Hypercalcemia     Ca-11.1 in 07/2011  . Vitamin D deficiency   . Costochondritis   . Insomnia   . Anemia   . Gastroparesis due to DM 05/01/2012  . PONV (postoperative nausea and vomiting)   . Hyperplastic gastric polyp 06/23/2012    ROS:   All systems reviewed and negative except as noted in the HPI.   Past Surgical History  Procedure Date  . Total abdominal hysterectomy w/ bilateral salpingoophorectomy 2000  . Breast excisional biopsy     benign, bilateral  . Nephrectomy 2008    Laparoscopic right nephrectomy, renal cell carcinoma  . Coronary artery bypass graft 2005, 2011    CABG-most recent SVG to RCA-12/05; RCA occluded with patent graft in 2006; Redo bypass in 07/2009  . Tumor excision 1981    removal of Hodgkins lymphoma  . Exploratory laparotomy w/ bowel resection     Small bowel resection of lipoma  . Aortic valve replacement 2011    Aortic valve replacement surgery with a 19 mm bioprosthetic device post redo CABG January 2011.  . Colonoscopy     Multiple with adenomatous polyps  . Cervical biopsy   . Liver biopsy   . Bone marrow biopsy   . Esophagogastroduodenoscopy   . Esophagogastroduodenoscopy 06/23/2012    Procedure: ESOPHAGOGASTRODUODENOSCOPY (EGD);  Surgeon: Gatha Mayer, MD;  Location: [facility];  Service: Endoscopy;   Laterality: N/A;  . Colonoscopy 06/23/2012    Procedure: COLONOSCOPY;  Surgeon: Gatha Mayer, MD;  Location: [facility];  Service: Endoscopy;  Laterality: N/A;  [name] dilation 06/23/2012    Procedure: [name] DILATION;  Surgeon: Gatha Mayer, MD;  Location: [facility];  Service: Endoscopy;;  26 fr     Family History  Problem Relation Age of Onset  . Heart attack Mother   . Heart attack Father   . Diabetes Father   . Hypertension Brother   . Glaucoma Brother   . Lung cancer Sister     meta  . Ovarian cancer Sister   . Breast cancer Sister   . Colon cancer Neg Hx   . Stomach cancer Neg Hx      History   Social History  . Marital Status: Married    Spouse Name: N/A    Number of Children: 2  . Years of Education: N/A   Occupational History  . full time     elementary principal   Social History Main Topics  . Smoking status: Never Smoker   . Smokeless tobacco: Never Used  . Alcohol Use: No  . Drug Use: No  . Sexually Active: Not on file   Other Topics Concern  . 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  See PaceArt for details. Note atrial flutter with post termination pauses.  Assess/Plan:

## 2012-07-23 NOTE — Assessment & Plan Note (Signed)
She appears to be having syncope due to post termination pauses out of atrial flutter. I have discussed the treatment options with the patient and her family. While she appears to be having atrial flutter, she is at risk for atrial fib as well. We have no good way to prevent her pauses other than placing a PPM. I have discussed the risks/benefits/goals/expectations of PPM with the patient and she wishes to proceed.

## 2012-07-23 NOTE — Assessment & Plan Note (Signed)
I would not recommend catheter ablation at this time. Once her PPM is in place, we can better monitor her atrial arrhythmias. If we see that she is having sustained atrial fib/flutter, she will need to be placed on systemic anti-coagulation.

## 2012-07-24 ENCOUNTER — Telehealth: Payer: Self-pay | Admitting: Internal Medicine

## 2012-07-24 NOTE — Telephone Encounter (Signed)
New problem:   What type of device is she planning on getting.  Upcoming appt on  1/30.

## 2012-07-24 NOTE — Telephone Encounter (Signed)
She was researching yesterday about different devices and the functions of each.  I let her know Dr Lovena Le wanted to use a Medtronic Adapta L for her.  She is aware on will call with any more questions

## 2012-07-24 NOTE — Telephone Encounter (Signed)
F/U   Returning call back to nurse

## 2012-07-25 ENCOUNTER — Encounter: Payer: Self-pay | Admitting: *Deleted

## 2012-07-25 ENCOUNTER — Encounter (INDEPENDENT_AMBULATORY_CARE_PROVIDER_SITE_OTHER): Payer: Self-pay | Admitting: Surgery

## 2012-07-25 ENCOUNTER — Ambulatory Visit (INDEPENDENT_AMBULATORY_CARE_PROVIDER_SITE_OTHER): Payer: BC Managed Care – PPO | Admitting: Surgery

## 2012-07-25 ENCOUNTER — Other Ambulatory Visit: Payer: Self-pay | Admitting: *Deleted

## 2012-07-25 VITALS — BP 130/62 | HR 80 | Temp 97.6°F | Resp 16 | Ht 63.0 in | Wt 127.6 lb

## 2012-07-25 DIAGNOSIS — R55 Syncope and collapse: Secondary | ICD-10-CM

## 2012-07-25 DIAGNOSIS — D179 Benign lipomatous neoplasm, unspecified: Secondary | ICD-10-CM

## 2012-07-25 NOTE — Progress Notes (Signed)
Chief Complaint:  Posterior cervical mass  History of Present Illness:  [name] [name] is an 62 y.o. female on whom I have performed abdominal surgery in the past and who has had a number of sequellae regarding treatment of her Hodgkin's disease.    Past Medical History  Diagnosis Date  . Aortic stenosis      bicuspid aortic valve; mean gradient of 20 mmHg in 2/10; aortic valve   replacement surgery(19-mm Edwards pericardial valve) with a redo coronary artery bypass graft procedure      in 07/2009; postoperative Dressler's syndrome    . Coronary artery disease     status post RCA saphenous vein graft in 06/1999; multiple preceding PCIs with restenosis; total obstruction of RCA in 2006 with patent graft   . CHF (congestive heart failure)     History of CHF; recurred following AVR/CABG surgery; intermittent PND  . Hyperlipidemia   . Hypertension   . Small bowel obstruction     Recurrent; resolved after resection of a lipoma  . Osteopenia      hip on DEXA in October 2007.  Marland Kitchen Anemia   . Carcinoma, renal cell 05/2007    Laparoscopic right nephrectomy  . NASH (nonalcoholic steatohepatitis) 1999    -biopsy in 1999  . Diabetes mellitus 03/2010     TYPE II: Hemoglobin A1c of 7.4 in  03/2010; 8.4 in 06/2010; treated with insulin pump  . Migraines   . Duodenal ulcer     Remote; H. pylori positive  . Colitis, ischemic 2008  . Gastroesophageal reflux disease     ? of GASTROPARESIS; IRRITABLE BOWEL SYNDROME  . Hodgkin's disease 1991    Mantle radiation therapy  . Exertional dyspnea     chest discomfort  . Statin intolerance   . Hypercalcemia     Ca-11.1 in 07/2011  . Vitamin D deficiency   . Costochondritis   . Insomnia   . Anemia   . Gastroparesis due to DM 05/01/2012  . PONV (postoperative nausea and vomiting)   . Hyperplastic gastric polyp 06/23/2012  . Arthritis   . Heart murmur   . Osteoporosis     Past Surgical History  Procedure Date  . Total abdominal hysterectomy w/  bilateral salpingoophorectomy 2000  . Breast excisional biopsy     benign, bilateral  . Nephrectomy 2008    Laparoscopic right nephrectomy, renal cell carcinoma  . Coronary artery bypass graft 2005, 2011    CABG-most recent SVG to RCA-12/05; RCA occluded with patent graft in 2006; Redo bypass in 07/2009  . Tumor excision 1981    removal of Hodgkins lymphoma  . Exploratory laparotomy w/ bowel resection     Small bowel resection of lipoma  . Aortic valve replacement 2011    Aortic valve replacement surgery with a 19 mm bioprosthetic device post redo CABG January 2011.  . Colonoscopy     Multiple with adenomatous polyps  . Cervical biopsy   . Liver biopsy   . Bone marrow biopsy   . Esophagogastroduodenoscopy   . Esophagogastroduodenoscopy 06/23/2012    Procedure: ESOPHAGOGASTRODUODENOSCOPY (EGD);  Surgeon: Gatha Mayer, MD;  Location: [facility];  Service: Endoscopy;  Laterality: N/A;  . Colonoscopy 06/23/2012    Procedure: COLONOSCOPY;  Surgeon: Gatha Mayer, MD;  Location: [facility];  Service: Endoscopy;  Laterality: N/A;  [name] dilation 06/23/2012    Procedure: [name] DILATION;  Surgeon: Gatha Mayer, MD;  Location: [facility];  Service: Endoscopy;;  54 fr  Current Outpatient Prescriptions  Medication Sig Dispense Refill  . ALPRAZolam (XANAX) 0.5 MG tablet Take 0.25 mg by mouth at bedtime.       Marland Kitchen aspirin 81 MG tablet Take 81 mg by mouth daily.        . Calcium Carbonate-Vitamin D (RA CALCIUM PLUS VITAMIN D) 600-400 MG-UNIT per tablet Take 1 tablet by mouth 2 (two) times daily.       . cholecalciferol (VITAMIN D) 1000 UNITS tablet Take 1,000 Units by mouth daily.        . Coenzyme Q10 (COQ10 PO) Take 600 mg by mouth daily.       . diclofenac sodium (VOLTAREN) 1 % GEL Apply topically 2 (two) times daily.      Marland Kitchen ezetimibe (ZETIA) 10 MG tablet Take 1 tablet (10 mg total) by mouth daily.  90 tablet  3  . fenofibrate (TRICOR) 145 MG tablet Take 145 mg by mouth daily.       . ferrous fumarate (FERRO-SEQUELS) 50 MG CR tablet Take 50 mg by mouth daily.      . fluticasone (FLONASE) 50 MCG/ACT nasal spray Place 2 sprays into the nose 3 times/day as needed-between meals & bedtime.       . furosemide (LASIX) 40 MG tablet Take 40 mg by mouth as needed.       . furosemide (LASIX) 80 MG tablet Take 80 mg by mouth daily.       . insulin glulisine (APIDRA) 100 UNIT/ML injection Inject into the skin daily. Sliding scale (insulin pump)      . KLOR-CON M20 20 MEQ tablet TAKE TWO TABLETS BY MOUTH DAILY  60 tablet  11  . metoprolol tartrate (LOPRESSOR) 25 MG tablet TAKE ONE TABLET TWICE DAILY  180 tablet  1  . Multiple Vitamins-Minerals (MULTIVITAMIN WITH MINERALS) tablet Take 1 tablet by mouth daily.        . nitroGLYCERIN (NITROLINGUAL) 0.4 MG/SPRAY spray Place 1 spray under the tongue every 5 (five) minutes as needed.        Marland Kitchen omeprazole (PRILOSEC) 20 MG capsule Take 20 mg by mouth as needed. 30 minutes before breakfast and supper      . polyethylene glycol powder (GLYCOLAX/MIRALAX) powder Take 17 g by mouth daily. 1/2 - 1 dose every day.      . potassium chloride SA (KLOR-CON M20) 20 MEQ tablet Take 40 mEq by mouth daily.       . pravastatin (PRAVACHOL) 10 MG tablet TAKE ONE (1) TABLET BY MOUTH EVERY      DAY  90 tablet  0  . sitaGLIPtan (JANUVIA) 100 MG tablet Take 100 mg by mouth daily.        . vitamin C (ASCORBIC ACID) 500 MG tablet Take 500 mg by mouth daily.       Clindamycin/lincomycin; Gentamycin; Atorvastatin; Avandia; Codeine; Erythromycin; Iodinated diagnostic agents; Metformin; Simvastatin; Cephalexin; Neosporin; and Penicillins Family History  Problem Relation Age of Onset  . Heart attack Mother   . Heart disease Mother   . Heart attack Father   . Diabetes Father   . Heart disease Father   . Hypertension Brother   . Glaucoma Brother   . Lung cancer Sister     meta  . Cancer Sister     breast - half sister (Paternal)  . Ovarian cancer Sister   . Cancer  Sister     cervical and lung - half sister (maternal)  . Breast cancer Sister   . Colon  cancer Neg Hx   . Stomach cancer Neg Hx    Social History:   reports that she has never smoked. She has never used smokeless tobacco. She reports that she does not drink alcohol or use illicit drugs.   REVIEW OF SYSTEMS - PERTINENT POSITIVES ONLY: noncontributor  Physical Exam:   Blood pressure 130/62, pulse 80, temperature 97.6 F (36.4 C), temperature source Temporal, resp. rate 16, height 5' 3"  (1.6 m), weight 127 lb 9.6 oz (57.879 kg). Body mass index is 22.60 kg/(m^2).  Gen:  WDWN WF NAD  Neurological: Alert and oriented to person, place, and time. Motor and sensory function is grossly intact  Head: Normocephalic and atraumatic.  Eyes: Conjunctivae are normal. Pupils are equal, round, and reactive to light. No scleral icterus.  Neck: Normal range of motion. Neck supple. No tracheal deviation or thyromegaly present. There is a racket ball size mass in the posterior neck that seems fused with C7.  Will need to get MRI (before she gets pacemaker on 1/30) to determine the extent of involvement.   Cardiovascular:  SR without murmurs or gallops.  No carotid bruits Respiratory: Effort normal.  No respiratory distress. No chest wall tenderness. Breath sounds normal.  No wheezes, rales or rhonchi.  Abdomen:  nontender GU: Musculoskeletal: Normal range of motion. Extremities are nontender. No cyanosis, edema or clubbing noted Lymphadenopathy: No cervical, preauricular, postauricular or axillary adenopathy is present Skin: Skin is warm and dry. No rash noted. No diaphoresis. No erythema. No pallor. Pscyh: Normal mood and affect. Behavior is normal. Judgment and thought content normal.   LABORATORY RESULTS: No results found for this or any previous visit (from the past 48 hour(s)).  RADIOLOGY RESULTS: No results found.  Problem List: Patient Active Problem List  Diagnosis  . Hyperlipidemia  . Hx  of adenomatous colonic polyps  . Hypertension  . Small bowel obstruction  . CHF (congestive heart failure)  . Aortic stenosis status post 19 mm Edwards pericardial valve replacement 2011  . Coronary artery disease  . Osteopenia  . Anemia  . NASH (nonalcoholic steatohepatitis)  . History of Carcinoma, renal cell  . Diabetes mellitus type 2, controlled  . History of Duodenal ulcer  . History of Colitis, ischemic  . Gastroesophageal reflux disease  . Hodgkin's disease  . Cholelithiasis  . Hypercalcemia  . Near syncope  . Gastroparesis due to DM  . Hyperplastic gastric polyp  . Sick sinus syndrome  . Atrial flutter    Assessment & Plan: Posterior cervical mass origin indeterminant. MRI to ruleout spine involvement    Matt B. Hassell Done, MD, [facility] [phone] beeper [phone]  07/25/2012 2:07 PM

## 2012-07-25 NOTE — Patient Instructions (Addendum)
See Dr. Hassell Done after MRI of neck

## 2012-07-29 ENCOUNTER — Ambulatory Visit
Admission: RE | Admit: 2012-07-29 | Discharge: 2012-07-29 | Disposition: A | Discharge status: Home / Self Care | Payer: BC Managed Care – PPO | Source: Ambulatory Visit | Attending: Surgery | Admitting: Surgery

## 2012-07-29 DIAGNOSIS — D179 Benign lipomatous neoplasm, unspecified: Secondary | ICD-10-CM

## 2012-07-29 MED ORDER — IOHEXOL 300 MG/ML  SOLN
75.0000 mL | Freq: Once | INTRAMUSCULAR | Status: AC | PRN
  Administered 2012-07-29: 75 mL via INTRAVENOUS

## 2012-07-30 ENCOUNTER — Ambulatory Visit: Payer: BC Managed Care – PPO | Admitting: Cardiology

## 2012-08-01 ENCOUNTER — Telehealth: Payer: Self-pay | Admitting: Cardiology

## 2012-08-01 ENCOUNTER — Ambulatory Visit: Payer: BC Managed Care – PPO | Admitting: Cardiology

## 2012-08-01 ENCOUNTER — Telehealth (INDEPENDENT_AMBULATORY_CARE_PROVIDER_SITE_OTHER): Payer: Self-pay

## 2012-08-01 NOTE — Telephone Encounter (Signed)
Pt calling for neck CT result and recommedations from Dr Hassell Done re: f/u. I advised her I will send this request to [facility] and Dr Hassell Done for review. Pt can be reached at [phone].

## 2012-08-01 NOTE — Telephone Encounter (Signed)
Please advise 

## 2012-08-01 NOTE — Telephone Encounter (Signed)
Patient states that years ago Dr.Rothbart prescribed her Alprazolam.  States that [facility] had been refilling it but will not do it anymore.  Wants to know if Dr.rothbart will send RX for Alprazolam to [facility]. / tgs

## 2012-08-03 NOTE — Telephone Encounter (Signed)
Permission to renew for one year.

## 2012-08-04 ENCOUNTER — Other Ambulatory Visit: Payer: Self-pay | Admitting: *Deleted

## 2012-08-04 MED ORDER — ALPRAZOLAM 0.25 MG PO TABS: 0.2500 mg | ORAL_TABLET | Freq: Every day | ORAL | Status: DC

## 2012-08-04 NOTE — Telephone Encounter (Signed)
Patient notified and written script prepared for patient to pick up

## 2012-08-06 ENCOUNTER — Telehealth (INDEPENDENT_AMBULATORY_CARE_PROVIDER_SITE_OTHER): Payer: Self-pay

## 2012-08-06 NOTE — Telephone Encounter (Signed)
Spoke with pt and gave her her CT results.  She was worrisome that it might be malignant, however everything showed just a lipoma.  She is wondering if she needs Sx, and how urgent is this matter?  The lipoma is only bothersome here and there (pt stated maybe twice a week, if someone manipulates the lipoma).  I let her know that I will speak with MM about her questions, and get back to her no later that Fri Jan 24th

## 2012-08-07 ENCOUNTER — Encounter (HOSPITAL_COMMUNITY): Payer: Self-pay | Admitting: Pharmacy Technician

## 2012-08-11 ENCOUNTER — Other Ambulatory Visit: Payer: Self-pay | Admitting: Cardiology

## 2012-08-14 ENCOUNTER — Other Ambulatory Visit (INDEPENDENT_AMBULATORY_CARE_PROVIDER_SITE_OTHER): Payer: BC Managed Care – PPO

## 2012-08-14 DIAGNOSIS — R55 Syncope and collapse: Secondary | ICD-10-CM

## 2012-08-14 LAB — CBC WITH DIFFERENTIAL/PLATELET
Basophils Absolute: 0.1 10*3/uL (ref 0.0–0.1)
Eosinophils Relative: 1.7 % (ref 0.0–5.0)
Hemoglobin: 11.6 g/dL — ABNORMAL LOW (ref 12.0–15.0)
Lymphocytes Relative: 19.9 % (ref 12.0–46.0)
Monocytes Relative: 7.2 % (ref 3.0–12.0)
Neutro Abs: 5.5 10*3/uL (ref 1.4–7.7)
RBC: 4.04 Mil/uL (ref 3.87–5.11)
RDW: 15.4 % — ABNORMAL HIGH (ref 11.5–14.6)
WBC: 7.9 10*3/uL (ref 4.5–10.5)

## 2012-08-14 LAB — BASIC METABOLIC PANEL
CO2: 29 mEq/L (ref 19–32)
Chloride: 106 mEq/L (ref 96–112)
Creatinine, Ser: 1 mg/dL (ref 0.4–1.2)
Sodium: 143 mEq/L (ref 135–145)

## 2012-08-15 ENCOUNTER — Encounter: Payer: Self-pay | Admitting: Cardiology

## 2012-08-18 ENCOUNTER — Telehealth: Payer: Self-pay | Admitting: Internal Medicine

## 2012-08-18 NOTE — Telephone Encounter (Signed)
New problem:   Procedure on Thursday has general questions .

## 2012-08-18 NOTE — Telephone Encounter (Signed)
lmom for pt to return my call   Permanent Transvenous Pacemaker on  08/21/12  with Dr. Lovena Le.

## 2012-08-19 NOTE — Telephone Encounter (Signed)
Gave her the instruction sheet over the phone

## 2012-08-20 MED ORDER — VANCOMYCIN HCL IN DEXTROSE 1-5 GM/200ML-% IV SOLN
1000.0000 mg | INTRAVENOUS | Status: DC
  Filled 2012-08-20: qty 200

## 2012-08-21 ENCOUNTER — Ambulatory Visit (HOSPITAL_COMMUNITY)
Admission: RE | Admit: 2012-08-21 | Discharge: 2012-08-22 | Disposition: A | Discharge status: Home / Self Care | Payer: BC Managed Care – PPO | Source: Ambulatory Visit | Attending: Internal Medicine | Admitting: Internal Medicine

## 2012-08-21 ENCOUNTER — Encounter (HOSPITAL_COMMUNITY): Payer: Self-pay | Admitting: General Practice

## 2012-08-21 ENCOUNTER — Encounter (HOSPITAL_COMMUNITY)
Admission: RE | Disposition: A | Discharge status: Home / Self Care | Payer: Self-pay | Source: Ambulatory Visit | Attending: Internal Medicine

## 2012-08-21 DIAGNOSIS — K56609 Unspecified intestinal obstruction, unspecified as to partial versus complete obstruction: Secondary | ICD-10-CM

## 2012-08-21 DIAGNOSIS — I495 Sick sinus syndrome: Secondary | ICD-10-CM

## 2012-08-21 DIAGNOSIS — R55 Syncope and collapse: Secondary | ICD-10-CM

## 2012-08-21 DIAGNOSIS — K5289 Other specified noninfective gastroenteritis and colitis: Secondary | ICD-10-CM | POA: Insufficient documentation

## 2012-08-21 DIAGNOSIS — E785 Hyperlipidemia, unspecified: Secondary | ICD-10-CM

## 2012-08-21 DIAGNOSIS — M899 Disorder of bone, unspecified: Secondary | ICD-10-CM | POA: Insufficient documentation

## 2012-08-21 DIAGNOSIS — Z794 Long term (current) use of insulin: Secondary | ICD-10-CM | POA: Insufficient documentation

## 2012-08-21 DIAGNOSIS — Z950 Presence of cardiac pacemaker: Secondary | ICD-10-CM

## 2012-08-21 DIAGNOSIS — I359 Nonrheumatic aortic valve disorder, unspecified: Secondary | ICD-10-CM | POA: Insufficient documentation

## 2012-08-21 DIAGNOSIS — I2510 Atherosclerotic heart disease of native coronary artery without angina pectoris: Secondary | ICD-10-CM | POA: Insufficient documentation

## 2012-08-21 DIAGNOSIS — I10 Essential (primary) hypertension: Secondary | ICD-10-CM | POA: Insufficient documentation

## 2012-08-21 DIAGNOSIS — I4892 Unspecified atrial flutter: Secondary | ICD-10-CM | POA: Insufficient documentation

## 2012-08-21 DIAGNOSIS — Z923 Personal history of irradiation: Secondary | ICD-10-CM | POA: Insufficient documentation

## 2012-08-21 DIAGNOSIS — Z79899 Other long term (current) drug therapy: Secondary | ICD-10-CM | POA: Insufficient documentation

## 2012-08-21 DIAGNOSIS — K7689 Other specified diseases of liver: Secondary | ICD-10-CM | POA: Insufficient documentation

## 2012-08-21 DIAGNOSIS — E119 Type 2 diabetes mellitus without complications: Secondary | ICD-10-CM | POA: Insufficient documentation

## 2012-08-21 DIAGNOSIS — I509 Heart failure, unspecified: Secondary | ICD-10-CM

## 2012-08-21 DIAGNOSIS — Z8553 Personal history of malignant neoplasm of renal pelvis: Secondary | ICD-10-CM | POA: Insufficient documentation

## 2012-08-21 DIAGNOSIS — Z905 Acquired absence of kidney: Secondary | ICD-10-CM | POA: Insufficient documentation

## 2012-08-21 DIAGNOSIS — Z7902 Long term (current) use of antithrombotics/antiplatelets: Secondary | ICD-10-CM | POA: Insufficient documentation

## 2012-08-21 DIAGNOSIS — C8190 Hodgkin lymphoma, unspecified, unspecified site: Secondary | ICD-10-CM | POA: Insufficient documentation

## 2012-08-21 DIAGNOSIS — Z7982 Long term (current) use of aspirin: Secondary | ICD-10-CM | POA: Insufficient documentation

## 2012-08-21 HISTORY — PX: PERMANENT PACEMAKER INSERTION: SHX5480

## 2012-08-21 HISTORY — PX: PACEMAKER INSERTION: SHX728

## 2012-08-21 HISTORY — DX: Presence of cardiac pacemaker: Z95.0

## 2012-08-21 LAB — GLUCOSE, CAPILLARY: Glucose-Capillary: 151 mg/dL — ABNORMAL HIGH (ref 70–99)

## 2012-08-21 LAB — SURGICAL PCR SCREEN: Staphylococcus aureus: NEGATIVE

## 2012-08-21 SURGERY — PERMANENT PACEMAKER INSERTION
Anesthesia: LOCAL

## 2012-08-21 MED ORDER — SODIUM CHLORIDE 0.9 % IR SOLN
Freq: Once | Status: DC
  Filled 2012-08-21: qty 1

## 2012-08-21 MED ORDER — MUPIROCIN 2 % EX OINT: TOPICAL_OINTMENT | Freq: Two times a day (BID) | CUTANEOUS | Status: DC

## 2012-08-21 MED ORDER — POTASSIUM CHLORIDE CRYS ER 20 MEQ PO TBCR
40.0000 meq | EXTENDED_RELEASE_TABLET | Freq: Every day | ORAL | Status: DC
  Administered 2012-08-21 – 2012-08-22 (×2): 40 meq via ORAL
  Filled 2012-08-21 (×3): qty 2

## 2012-08-21 MED ORDER — FUROSEMIDE 80 MG PO TABS
80.0000 mg | ORAL_TABLET | Freq: Every day | ORAL | Status: DC
  Administered 2012-08-22: 80 mg via ORAL
  Filled 2012-08-21 (×2): qty 1

## 2012-08-21 MED ORDER — PRAVASTATIN SODIUM 10 MG PO TABS
10.0000 mg | ORAL_TABLET | Freq: Every day | ORAL | Status: DC
  Administered 2012-08-21: 10 mg via ORAL
  Filled 2012-08-21 (×3): qty 1

## 2012-08-21 MED ORDER — ONDANSETRON HCL 4 MG/2ML IJ SOLN: 4.0000 mg | Freq: Four times a day (QID) | INTRAMUSCULAR | Status: DC | PRN

## 2012-08-21 MED ORDER — CHLORHEXIDINE GLUCONATE 4 % EX LIQD: 60.0000 mL | Freq: Once | CUTANEOUS | Status: DC

## 2012-08-21 MED ORDER — EZETIMIBE 10 MG PO TABS
10.0000 mg | ORAL_TABLET | Freq: Every day | ORAL | Status: DC
  Administered 2012-08-21 – 2012-08-22 (×2): 10 mg via ORAL
  Filled 2012-08-21 (×3): qty 1

## 2012-08-21 MED ORDER — MIDAZOLAM HCL 2 MG/2ML IJ SOLN
INTRAMUSCULAR | Status: AC
  Filled 2012-08-21: qty 2

## 2012-08-21 MED ORDER — SODIUM CHLORIDE 0.9 % IJ SOLN: 3.0000 mL | Freq: Two times a day (BID) | INTRAMUSCULAR | Status: DC

## 2012-08-21 MED ORDER — LINAGLIPTIN 5 MG PO TABS
5.0000 mg | ORAL_TABLET | Freq: Every day | ORAL | Status: DC
  Administered 2012-08-22: 5 mg via ORAL
  Filled 2012-08-21 (×2): qty 1

## 2012-08-21 MED ORDER — VITAMIN D3 25 MCG (1000 UNIT) PO TABS
1000.0000 [IU] | ORAL_TABLET | Freq: Every day | ORAL | Status: DC
  Administered 2012-08-21 – 2012-08-22 (×2): 1000 [IU] via ORAL
  Filled 2012-08-21 (×3): qty 1

## 2012-08-21 MED ORDER — POLYETHYLENE GLYCOL 3350 17 G PO PACK
17.0000 g | PACK | Freq: Every day | ORAL | Status: DC
  Administered 2012-08-22: 17 g via ORAL
  Filled 2012-08-21 (×2): qty 1

## 2012-08-21 MED ORDER — VITAMIN C 500 MG PO TABS
500.0000 mg | ORAL_TABLET | Freq: Every day | ORAL | Status: DC
  Administered 2012-08-21: 500 mg via ORAL
  Filled 2012-08-21 (×2): qty 1

## 2012-08-21 MED ORDER — SIMVASTATIN 5 MG PO TABS
5.0000 mg | ORAL_TABLET | Freq: Every day | ORAL | Status: DC
  Filled 2012-08-21 (×2): qty 1

## 2012-08-21 MED ORDER — POLYETHYLENE GLYCOL 3350 17 GM/SCOOP PO POWD
17.0000 g | Freq: Every day | ORAL | Status: DC
  Filled 2012-08-21: qty 255

## 2012-08-21 MED ORDER — FUROSEMIDE 40 MG PO TABS
40.0000 mg | ORAL_TABLET | Freq: Every day | ORAL | Status: DC
  Filled 2012-08-21 (×2): qty 1

## 2012-08-21 MED ORDER — LIDOCAINE HCL (PF) 1 % IJ SOLN
INTRAMUSCULAR | Status: AC
  Filled 2012-08-21: qty 60

## 2012-08-21 MED ORDER — SODIUM CHLORIDE 0.9 % IV SOLN: 250.0000 mL | INTRAVENOUS | Status: DC

## 2012-08-21 MED ORDER — IBUPROFEN 400 MG PO TABS
400.0000 mg | ORAL_TABLET | Freq: Every evening | ORAL | Status: AC | PRN
  Administered 2012-08-21: 400 mg via ORAL
  Filled 2012-08-21: qty 1

## 2012-08-21 MED ORDER — MUPIROCIN 2 % EX OINT
TOPICAL_OINTMENT | CUTANEOUS | Status: AC
  Filled 2012-08-21: qty 22

## 2012-08-21 MED ORDER — MIDAZOLAM HCL 5 MG/5ML IJ SOLN
INTRAMUSCULAR | Status: AC
  Filled 2012-08-21: qty 5

## 2012-08-21 MED ORDER — VITAMIN C 500 MG PO TABS
500.0000 mg | ORAL_TABLET | Freq: Every day | ORAL | Status: DC
  Filled 2012-08-21 (×2): qty 1

## 2012-08-21 MED ORDER — FENTANYL CITRATE 0.05 MG/ML IJ SOLN
INTRAMUSCULAR | Status: AC
  Filled 2012-08-21: qty 2

## 2012-08-21 MED ORDER — IBUPROFEN 600 MG PO TABS
600.0000 mg | ORAL_TABLET | Freq: Once | ORAL | Status: AC
  Administered 2012-08-21: 600 mg via ORAL
  Filled 2012-08-21: qty 1

## 2012-08-21 MED ORDER — SODIUM CHLORIDE 0.45 % IV SOLN
INTRAVENOUS | Status: DC
  Administered 2012-08-21: 09:00:00 via INTRAVENOUS

## 2012-08-21 MED ORDER — VANCOMYCIN HCL IN DEXTROSE 1-5 GM/200ML-% IV SOLN
1000.0000 mg | Freq: Two times a day (BID) | INTRAVENOUS | Status: AC
  Administered 2012-08-22: 1000 mg via INTRAVENOUS
  Filled 2012-08-21 (×2): qty 200

## 2012-08-21 MED ORDER — METOPROLOL TARTRATE 25 MG PO TABS
25.0000 mg | ORAL_TABLET | Freq: Two times a day (BID) | ORAL | Status: DC
  Administered 2012-08-22: 25 mg via ORAL
  Filled 2012-08-21 (×3): qty 1

## 2012-08-21 MED ORDER — PANTOPRAZOLE SODIUM 40 MG PO TBEC
40.0000 mg | DELAYED_RELEASE_TABLET | Freq: Every day | ORAL | Status: DC
  Administered 2012-08-22: 40 mg via ORAL
  Filled 2012-08-21: qty 1

## 2012-08-21 MED ORDER — ALPRAZOLAM 0.25 MG PO TABS
0.2500 mg | ORAL_TABLET | Freq: Every day | ORAL | Status: DC
  Administered 2012-08-21: 0.25 mg via ORAL
  Filled 2012-08-21: qty 1

## 2012-08-21 MED ORDER — SODIUM CHLORIDE 0.9 % IJ SOLN: 3.0000 mL | INTRAMUSCULAR | Status: DC | PRN

## 2012-08-21 MED ORDER — ASPIRIN EC 81 MG PO TBEC
81.0000 mg | DELAYED_RELEASE_TABLET | Freq: Every day | ORAL | Status: DC
Start: 2012-08-21 — End: 2012-08-22
  Administered 2012-08-21 – 2012-08-22 (×2): 81 mg via ORAL
  Filled 2012-08-21 (×3): qty 1

## 2012-08-21 MED ORDER — ACETAMINOPHEN 325 MG PO TABS
325.0000 mg | ORAL_TABLET | ORAL | Status: DC | PRN
  Administered 2012-08-21: 650 mg via ORAL
  Filled 2012-08-21: qty 2

## 2012-08-21 MED ORDER — CEFAZOLIN SODIUM-DEXTROSE 2-3 GM-% IV SOLR
INTRAVENOUS | Status: AC
  Filled 2012-08-21: qty 50

## 2012-08-21 NOTE — H&P (View-Only) (Signed)
HPI [name] [name] returns today for followup. She is a pleasant 62 yo woman with a h/o CAD and Aortic valve disease, s/p CABG and AVR. She has had recurrent syncope. She is s/p ILR and interogation of her device demonstrated atrial flutter with a post termination of 4 seconds. She has actually had a couple of episodes. She denies chest pain. She has noted palpitations prior to other episodes.  Allergies  Allergen Reactions  . Clindamycin/Lincomycin Shortness Of Breath and Swelling  . Gentamycin (Gentamicin) Hives and Itching    Reaction noted post loop recorder implant, where gentamycin was used for irrigation.  . Atorvastatin     Muscle and joint pain  . Avandia (Rosiglitazone Maleate)     Congestive heart failure  . Codeine Nausea And Vomiting  . Erythromycin Nausea And Vomiting  . Iodinated Diagnostic Agents     Vessel pain  . Metformin     Congestive heart failure  . Simvastatin     Increased LFT's  . Cephalexin Swelling and Rash  . Neosporin (Neomycin-Bacitracin Zn-Polymyx) Rash  . Penicillins Swelling and Rash     Current Outpatient Prescriptions  Medication Sig Dispense Refill  . ALPRAZolam (XANAX) 0.5 MG tablet Take 0.25 mg by mouth at bedtime.       Marland Kitchen aspirin 81 MG tablet Take 81 mg by mouth daily.        . Calcium Carbonate-Vitamin D (RA CALCIUM PLUS VITAMIN D) 600-400 MG-UNIT per tablet Take 1 tablet by mouth 2 (two) times daily.       . cholecalciferol (VITAMIN D) 1000 UNITS tablet Take 1,000 Units by mouth daily.        . Coenzyme Q10 (COQ10 PO) Take 600 mg by mouth daily.       . diclofenac sodium (VOLTAREN) 1 % GEL Apply topically 2 (two) times daily.      Marland Kitchen ezetimibe (ZETIA) 10 MG tablet Take 1 tablet (10 mg total) by mouth daily.  90 tablet  3  . fenofibrate (TRICOR) 145 MG tablet Take 145 mg by mouth daily.      . ferrous fumarate (FERRO-SEQUELS) 50 MG CR tablet Take 50 mg by mouth daily.      . fluticasone (FLONASE) 50 MCG/ACT nasal spray Place 2 sprays into the  nose 3 times/day as needed-between meals & bedtime.       . furosemide (LASIX) 40 MG tablet Take 40 mg by mouth as needed.       . furosemide (LASIX) 80 MG tablet Take 80 mg by mouth daily.       . insulin glulisine (APIDRA) 100 UNIT/ML injection Inject into the skin daily. Sliding scale (insulin pump)      . KLOR-CON M20 20 MEQ tablet TAKE TWO TABLETS BY MOUTH DAILY  60 tablet  11  . metoprolol tartrate (LOPRESSOR) 25 MG tablet TAKE ONE TABLET TWICE DAILY  180 tablet  1  . Multiple Vitamins-Minerals (MULTIVITAMIN WITH MINERALS) tablet Take 1 tablet by mouth daily.        . nitroGLYCERIN (NITROLINGUAL) 0.4 MG/SPRAY spray Place 1 spray under the tongue every 5 (five) minutes as needed.        Marland Kitchen omeprazole (PRILOSEC) 20 MG capsule Take 20 mg by mouth as needed. 30 minutes before breakfast and supper      . polyethylene glycol powder (GLYCOLAX/MIRALAX) powder Take 17 g by mouth daily. 1/2 - 1 dose every day.      . potassium chloride SA (KLOR-CON M20) 20 MEQ tablet  Take 40 mEq by mouth daily.       . pravastatin (PRAVACHOL) 10 MG tablet TAKE ONE (1) TABLET BY MOUTH EVERY      DAY  90 tablet  0  . sitaGLIPtan (JANUVIA) 100 MG tablet Take 100 mg by mouth daily.        . vitamin C (ASCORBIC ACID) 500 MG tablet Take 500 mg by mouth daily.         Past Medical History  Diagnosis Date  . Aortic stenosis      bicuspid aortic valve; mean gradient of 20 mmHg in 2/10; aortic valve   replacement surgery(19-mm Edwards pericardial valve) with a redo coronary artery bypass graft procedure      in 07/2009; postoperative Dressler's syndrome    . Coronary artery disease     status post RCA saphenous vein graft in 06/1999; multiple preceding PCIs with restenosis; total obstruction of RCA in 2006 with patent graft   . CHF (congestive heart failure)     History of CHF; recurred following AVR/CABG surgery; intermittent PND  . Hyperlipidemia   . Hypertension   . Small bowel obstruction     Recurrent; resolved  after resection of a lipoma  . Osteopenia      hip on DEXA in October 2007.  Marland Kitchen Anemia   . Carcinoma, renal cell 05/2007    Laparoscopic right nephrectomy  . NASH (nonalcoholic steatohepatitis) 1999    -biopsy in 1999  . Diabetes mellitus 03/2010     TYPE II: Hemoglobin A1c of 7.4 in  03/2010; 8.4 in 06/2010; treated with insulin pump  . Migraines   . Duodenal ulcer     Remote; H. pylori positive  . Colitis, ischemic 2008  . Gastroesophageal reflux disease     ? of GASTROPARESIS; IRRITABLE BOWEL SYNDROME  . Hodgkin's disease 1991    Mantle radiation therapy  . Exertional dyspnea     chest discomfort  . Statin intolerance   . Hypercalcemia     Ca-11.1 in 07/2011  . Vitamin D deficiency   . Costochondritis   . Insomnia   . Anemia   . Gastroparesis due to DM 05/01/2012  . PONV (postoperative nausea and vomiting)   . Hyperplastic gastric polyp 06/23/2012    ROS:   All systems reviewed and negative except as noted in the HPI.   Past Surgical History  Procedure Date  . Total abdominal hysterectomy w/ bilateral salpingoophorectomy 2000  . Breast excisional biopsy     benign, bilateral  . Nephrectomy 2008    Laparoscopic right nephrectomy, renal cell carcinoma  . Coronary artery bypass graft 2005, 2011    CABG-most recent SVG to RCA-12/05; RCA occluded with patent graft in 2006; Redo bypass in 07/2009  . Tumor excision 1981    removal of Hodgkins lymphoma  . Exploratory laparotomy w/ bowel resection     Small bowel resection of lipoma  . Aortic valve replacement 2011    Aortic valve replacement surgery with a 19 mm bioprosthetic device post redo CABG January 2011.  . Colonoscopy     Multiple with adenomatous polyps  . Cervical biopsy   . Liver biopsy   . Bone marrow biopsy   . Esophagogastroduodenoscopy   . Esophagogastroduodenoscopy 06/23/2012    Procedure: ESOPHAGOGASTRODUODENOSCOPY (EGD);  Surgeon: Gatha Mayer, MD;  Location: [facility];  Service: Endoscopy;   Laterality: N/A;  . Colonoscopy 06/23/2012    Procedure: COLONOSCOPY;  Surgeon: Gatha Mayer, MD;  Location: [facility];  Service: Endoscopy;  Laterality: N/A;  [name] dilation 06/23/2012    Procedure: [name] DILATION;  Surgeon: Gatha Mayer, MD;  Location: [facility];  Service: Endoscopy;;  81 fr     Family History  Problem Relation Age of Onset  . Heart attack Mother   . Heart attack Father   . Diabetes Father   . Hypertension Brother   . Glaucoma Brother   . Lung cancer Sister     meta  . Ovarian cancer Sister   . Breast cancer Sister   . Colon cancer Neg Hx   . Stomach cancer Neg Hx      History   Social History  . Marital Status: Married    Spouse Name: N/A    Number of Children: 2  . Years of Education: N/A   Occupational History  . full time     elementary principal   Social History Main Topics  . Smoking status: Never Smoker   . Smokeless tobacco: Never Used  . Alcohol Use: No  . Drug Use: No  . Sexually Active: Not on file   Other Topics Concern  . 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  See PaceArt for details. Note atrial flutter with post termination pauses.  Assess/Plan:

## 2012-08-21 NOTE — Op Note (Signed)
EP Procedure Note  Procedure: DDD PPM insertion with removal of an ILR Indication : symptomatic bradycardia Complications: none  Dictated# [id number].

## 2012-08-21 NOTE — Interval H&P Note (Signed)
History and Physical Interval Note:  08/21/2012 9:25 AM  [name] [name]  has presented today for surgery, with the diagnosis of snycope  The various methods of treatment have been discussed with the patient and family. After consideration of risks, benefits and other options for treatment, the patient has consented to  Procedure(s) (LRB) with comments: PERMANENT PACEMAKER INSERTION (N/A) as a surgical intervention .  The patient's history has been reviewed, patient examined, no change in status, stable for surgery.  I have reviewed the patient's chart and labs.  Questions were answered to the patient's satisfaction.     Mikle Bosworth.D.

## 2012-08-21 NOTE — Op Note (Signed)
NAMEMarland [name]  [name] [name]             ACCOUNT NO.:  [id number]  MEDICAL RECORD NO.:  [id number]  LOCATION:  3W10C                        FACILITY:  [facility]  PHYSICIAN:  Champ Mungo. Lovena Le, MD    DATE OF BIRTH:  1950-09-10  DATE OF PROCEDURE:  08/21/2012 DATE OF DISCHARGE:                              OPERATIVE REPORT   PROCEDURE PERFORMED:  Insertion of a dual-chamber pacemaker with removal of implantable loop recorder.  INTRODUCTION:  The patient is a 62 year old woman, status post aortic valve replacement, who developed syncope, was subsequently found on implantable loop recorder to have pauses of over 4 seconds.  She is now referred for insertion of a dual-chamber pacemaker and removal of her implantable loop recorder.  PROCEDURE:  After informed consent was obtained, the patient was taken to the Diagnostic EP Lab in the fasting state.  After usual preparation and draping, intravenous fentanyl and midazolam was given for sedation. A 30 mL of lidocaine was infiltrated into the left infraclavicular region.  A 5-cm incision was carried out over this region, and electrocautery was utilized to dissect down to the fascial plane.  The left subclavian vein was then punctured x2 and the Medtronic model 5076 52-cm active fixation pacing lead, serial number [id number] was advanced to the right ventricle.  Mapping was then carried out.  At the final site on the RV septum, the R-waves were 22, the impedance was 1100, the threshold 0.5 volts at 0.5 milliseconds with the lead actively fixed. There was a large current of injury.  A 10-volt pacing did not stimulate the diaphragm.  With the ventricular lead in satisfactory position, attention was then turned to placement of the atrial lead.  It was placed in the anterolateral portion of the right atrium where the P- waves measured 2 millivolts.  The pacing impedance was 600 ohms and threshold of 0.5 volts at 0.5 milliseconds.  A 10-volt pacing did  not stimulate the diaphragm.  With these satisfactory parameters, the leads were secured to the subpectoral fascia with a figure-of-eight silk suture.  The sewing sleeve was secured with silk suture.  At this point, electrocautery was utilized to along with blunt dissection to dissect down through the pectoralis major and dissect pectoralis major from pectoralis minor.  A pocket was made subpectorally in this location. The pocket was irrigated and electrocautery was utilized to assure hemostasis.  The new Medtronic Adapta L dual-chamber pacemaker, serial number [id number] was connected to the atrial and the RV leads and placed back in the subpectoral pocket.  The muscle was reapproximated with 2-0 Vicryl suture and the subcu tissue was reapproximated with 2-0 and 3-0 Vicryl sutures.  Benzoin and Steri-Strips were painted on the skin, pressure dressing was applied, and the patient was then prepped for removal of her implantable loop recorder.  A 30 mL of lidocaine was infiltrated into the old implantable loop recorder insertion site.  A 3- cm incision was carried out over this region, and electrocautery was utilized to dissect down to the loop recorder pocket.  The suture was removed from the generator and the generator was removed with gentle traction.  Electrocautery was utilized to assure hemostasis.  Antibiotic irrigation was utilized to  irrigate the pocket and the incision was closed with 2-0 and 3-0 Vicryl.  Additional benzoin and Steri-Strips were painted on the skin and a pressure dressing was applied, and the patient was returned to her room in satisfactory condition.  COMPLICATIONS:  There were no immediate procedure complications.  RESULTS:  This demonstrate successful implantation of a Medtronic dual- chamber pacemaker followed by removal of an implantable loop recorder without immediate procedure complication.     Champ Mungo. Lovena Le, MD     GWT/MEDQ  D:  08/21/2012  T:   08/21/2012  Job:  [id number]  cc:   [name] [name]. [name] Haw, MD, [facility]

## 2012-08-21 NOTE — Interval H&P Note (Signed)
History and Physical Interval Note:  08/21/2012 1:21 PM  [name] [name]  has presented today for surgery, with the diagnosis of snycope  The various methods of treatment have been discussed with the patient and family. After consideration of risks, benefits and other options for treatment, the patient has consented to  Procedure(s) (LRB) with comments: PERMANENT PACEMAKER INSERTION (N/A) as a surgical intervention .  The patient's history has been reviewed, patient examined, no change in status, stable for surgery.  I have reviewed the patient's chart and labs.  Questions were answered to the patient's satisfaction.     Mikle Bosworth.D.

## 2012-08-22 ENCOUNTER — Ambulatory Visit (HOSPITAL_COMMUNITY): Payer: BC Managed Care – PPO

## 2012-08-22 DIAGNOSIS — Z950 Presence of cardiac pacemaker: Secondary | ICD-10-CM

## 2012-08-22 LAB — GLUCOSE, CAPILLARY
Glucose-Capillary: 143 mg/dL — ABNORMAL HIGH (ref 70–99)
Glucose-Capillary: 140 mg/dL — ABNORMAL HIGH (ref 70–99)

## 2012-08-22 MED ORDER — IBUPROFEN 600 MG PO TABS
600.0000 mg | ORAL_TABLET | Freq: Once | ORAL | Status: AC
  Administered 2012-08-22: 600 mg via ORAL
  Filled 2012-08-22: qty 1

## 2012-08-22 NOTE — Progress Notes (Signed)
   ELECTROPHYSIOLOGY ROUNDING NOTE    Patient Name: [name] [name] Date of Encounter: 08-22-2012    SUBJECTIVE:Patient feels well.  No chest pain or shortness of breath.  Moderate incisional soreness.  S/p dual chamber pacemaker implant and ILR explant 08-22-2012.  Some oozing at ILR explant site.  Pressure dressing applied this morning with instructions to remove tomorrow afternoon.   TELEMETRY: Reviewed telemetry pt in sinus rhythm, occasional atrial pacing Filed Vitals:   08/21/12 1555 08/21/12 1610 08/21/12 1640 08/22/12 0606  BP: 110/46 125/53 112/60 93/33  Pulse: 82 83 84 79  Temp: 98 F (36.7 C) 97.8 F (36.6 C) 98.2 F (36.8 C) 97.3 F (36.3 C)  TempSrc:    Oral  Resp: 18     Height:      Weight:    127 lb (57.607 kg)  SpO2:    95%    Intake/Output Summary (Last 24 hours) at 08/22/12 0911 Last data filed at 08/21/12 1700  Gross per 24 hour  Intake    240 ml  Output      0 ml  Net    240 ml    Radiology/Studies:  Dg Chest 2 View 08/22/2012  *RADIOLOGY REPORT*  Clinical Data: Post pacemaker insertion.  CHEST - 2 VIEW  Comparison: 07/11/2012  Findings: Patient is status post median sternotomy and valve replacement.  A dual lead pacer has been placed via a left subclavian approach with lead tips projecting in the region of the right atrium and right ventricle.  Lead wires appear intact.  The cardiomediastinal silhouette is stable and within normal limits with aortic arch calcification again noted.  The lung fields demonstrate some mild prominence of the interstitial markings compatible with underlying bronchitic change. No focal infiltrates or signs of congestive failure are seen. Minimal scarring at the right costophrenic angle is stable.  Overall bone density appears decreased relative to that expected for patient age.  No focal bony abnormalities are identified.  IMPRESSION: Pacer placement as noted above.  Stable cardiopulmonary appearance otherwise noted with mild  bronchitic change and no new worrisome focal or acute abnormality.  Bone density overall appears relatively low given the patient's age.  This can be further assessed with DXA evaluation if desired   Original Report Authenticated By: Ponciano Ort, M.D.    PHYSICAL EXAM Left chest without hematoma or ecchymosis  DEVICE INTERROGATION: Device interrogated by industry.  Lead values including impedence, sensing, threshold within normal values.    Wound care, arm mobility, restrictions reviewed with patient.  Routine follow up scheduled.    Thompson Grayer, MD

## 2012-08-22 NOTE — Discharge Summary (Signed)
ELECTROPHYSIOLOGY PROCEDURE DISCHARGE SUMMARY    Patient ID: [name] [name],  MRN: [id number], DOB/AGE: [birth year] 60 y.o.  Admit date: 08/21/2012 Discharge date: 08/22/2012  Primary Care Physician: [name] Lincoln, MD Primary Cardiologist: [name] Ducking, MD  Primary Discharge Diagnosis:  Recurrent syncope with documented pauses on loop recorder  Secondary Discharge Diagnosis:  1.  Atrial flutter- identified on loop recorder 2.  Aortic stenosis s/p AVR 3.  CAD s/p CABG 4.  Diabetes 5.  Hypertension 6.  Hodgkin's disease s/p mantle radiation therapy 7.  Osteopenia- hip on DEXA 04/2006 8.  Renal cell carcinoma s/p right nephrectomy 9.  Nonalcoholic steatohepatitis 10.  Ischemic colitis 11.  Hyperplastic gastric polyp  Procedures This Admission:  1.  Implantation of a dual chamber pacemaker on 08-21-2012 by [name] [name] [name].  The patient received a MDT ADDRL1 pacemaker with model number 5076 right atrial and right ventricular leads. Her previously implanted loop recorder was removed.  There were no early apparent complications.  2.  CXR on 08-22-2012 demonstrated no pneumothorax status post device implantation.  Brief HPI: [name] [name] is a 62 year old female the above past medical history.  She has had recurrent syncope. She is s/p ILR and interogation of her device demonstrated atrial flutter with a post termination of 4 seconds. She has actually had a couple of episodes. She denies chest pain. She has noted palpitations prior to other episodes.  Because of risk of further atrial arrhythmias, pacemaker implantation was recommended.  Risks, benefits, and alternatives were reviewed with the patient who wished to proceed.   Hospital Course:  The patient was admitted and underwent implantation of a dual chamber pacemaker with details as outlined above.   She was monitored on telemetry overnight which demonstrated sinus rhythm with occasional atrial pacing.  Left chest was without  hematoma or ecchymosis.  The device was interrogated and found to be functioning normally.  CXR was obtained and demonstrated no pneumothorax status post device implantation.  Wound care, arm mobility, and restrictions were reviewed with the patient.  [name] [name] examined the patient and considered them stable for discharge to home.  Because of some oozing at device site, a pressure dressing was applied prior to discharge.  The patient will have discussions about anticoagulation with [name] [name] [name] at wound check appointment on 08-28-12.     Discharge Vitals: Blood pressure 133/70, pulse 79, temperature 97.3 F (36.3 C), temperature source Oral, resp. rate 18, height 5' 3"  (1.6 m), weight 127 lb (57.607 kg), SpO2 95.00%.    Physical Exam: Filed Vitals:   08/21/12 1610 08/21/12 1640 08/22/12 0606 08/22/12 0915  BP: 125/53 112/60 93/33 133/70  Pulse: 83 84 79 79  Temp: 97.8 F (36.6 C) 98.2 F (36.8 C) 97.3 F (36.3 C)   TempSrc:   Oral   Resp:      Height:      Weight:   127 lb (57.607 kg)   SpO2:   95%     GEN- The patient is well appearing, alert and oriented x 3 today.   Head- normocephalic, atraumatic Eyes-  Sclera clear, conjunctiva pink Ears- hearing intact Oropharynx- clear Neck- supple,  Lungs- Clear to ausculation bilaterally, normal work of breathing Heart- Regular rate and rhythm, no murmurs, rubs or gallops, PMI not laterally displaced GI- soft, NT, ND, + BS Extremities- no clubbing, cyanosis, or edema MS- no significant deformity or atrophy Skin- no rash or lesion Psych- euthymic mood, full affect Neuro- strength and sensation are intact  Labs:   Lab Results  Component Value Date   WBC 7.9 08/14/2012   HGB 11.6* 08/14/2012   HCT 34.9* 08/14/2012   MCV 86.4 08/14/2012   PLT 140.0* 08/14/2012      Discharge Medications:    Medication List     As of 08/22/2012 10:56 AM    TAKE these medications         ALPRAZolam 0.25 MG tablet   Commonly known as: XANAX   Take 1  tablet (0.25 mg total) by mouth at bedtime.      APIDRA 100 UNIT/ML injection   Generic drug: insulin glulisine   Inject into the skin daily. Sliding scale (insulin pump)      aspirin EC 81 MG tablet   Take 81 mg by mouth daily.      cholecalciferol 1000 UNITS tablet   Commonly known as: VITAMIN D   Take 1,000 Units by mouth daily.      COQ10 PO   Take 600 mg by mouth daily.      desoximetasone 0.05 % cream   Commonly known as: TOPICORT   Apply 1 application topically 2 (two) times daily as needed. When skin starts peeling      ezetimibe 10 MG tablet   Commonly known as: ZETIA   Take 1 tablet (10 mg total) by mouth daily.      fenofibrate 145 MG tablet   Commonly known as: TRICOR   TAKE ONE (1) TABLET BY MOUTH EVERY DAY      ferrous fumarate 50 MG CR tablet   Commonly known as: FERRO-SEQUELS   Take 50 mg by mouth daily.      furosemide 80 MG tablet   Commonly known as: LASIX   Take 80 mg by mouth daily.      furosemide 40 MG tablet   Commonly known as: LASIX   Take 40 mg by mouth as needed.      Krill Oil Omega-3 300 MG Caps   Take 1 capsule by mouth daily.      metoprolol tartrate 25 MG tablet   Commonly known as: LOPRESSOR   TAKE ONE TABLET TWICE DAILY      multivitamin with minerals tablet   Take 1 tablet by mouth daily.      nitroGLYCERIN 0.4 MG/SPRAY spray   Commonly known as: NITROLINGUAL   Place 1 spray under the tongue every 5 (five) minutes as needed.      omeprazole 20 MG capsule   Commonly known as: PRILOSEC   Take 20 mg by mouth daily. Before breakfast      polyethylene glycol powder powder   Commonly known as: GLYCOLAX/MIRALAX   Take 17 g by mouth daily. 1/2 - 1 dose every day.      potassium chloride SA 20 MEQ tablet   Commonly known as: K-DUR,KLOR-CON   Take 40 mEq by mouth daily.      pravastatin 10 MG tablet   Commonly known as: PRAVACHOL   TAKE ONE (1) TABLET BY MOUTH EVERY      DAY      RA CALCIUM PLUS VITAMIN D 600-400 MG-UNIT  per tablet   Generic drug: Calcium Carbonate-Vitamin D   Take 1 tablet by mouth 2 (two) times daily.      sitaGLIPtin 100 MG tablet   Commonly known as: JANUVIA   Take 100 mg by mouth daily.      vitamin C 500 MG tablet   Commonly known as: ASCORBIC ACID  Take 500 mg by mouth daily.        Disposition:      Discharge Orders    Future Appointments: Provider: Department: Dept Phone: Center:   08/28/2012 10:30 AM [facility] [name]) [phone] [facility]   12/02/2012 9:00 AM Evans Lance, MD [place]) [phone] [facility]   02/04/2013 9:00 AM [facility] [phone] None   02/04/2013 9:30 AM [name], MD [place] [phone] None     Future Orders Please Complete By Expires   Diet - low sodium heart healthy      Increase activity slowly        Follow-up Information    Follow up with [facility]. On 08/28/2012. (At 10:30 AM for wound check)    Contact information:   [name] [place] [address] [phone]      Follow up with [name], MD. On 12/02/2012. (At 9:00 AM)    Contact information:   [address] [place] [address] [phone]         Duration of Discharge Encounter: Greater than 30 minutes including physician time.   Thompson Grayer, MD 08/22/2012, 10:56 AM

## 2012-08-22 NOTE — Progress Notes (Signed)
Pt provided with dc instructions and education. Pt verbalized understanding. Pt educated on arm restrictions and able to teach back understanding. No questions at this time. IV removed with tip intact. Heart monitor cleaned and returned to front. Dorna Bloom, RN

## 2012-08-23 ENCOUNTER — Telehealth: Payer: Self-pay | Admitting: Cardiology

## 2012-08-23 NOTE — Telephone Encounter (Signed)
[name] [name] called regarding her pressure dressing which was placed yesterday prior to DC after undergoing PPM implantation with Dr. Lovena Le on Thursday. She denies pain, swelling, drainage/bleeding or fever. She was instructed to remove the dressing today. She has been able to remove most of the dressing but states there is about 1/4 piece of gauze remaining over the Steri-strips. She again denies bleeding. She is also concerned about redness, irritation and itching where the tape was placed. I instructed her to call our office on Monday if she was unable to remove the gauze. I again explained that she cannot get the site wet for 1 week. I instructed her to apply OTC hydrocortisone ointment on the shoulder and breast where she is now having redness, irritation and itching. I explained that she CANNOT apply lotions/creams/ointments to the implant/PPM site. She expressed verbal understanding and agrees with this plan of care.

## 2012-08-25 ENCOUNTER — Telehealth: Payer: Self-pay | Admitting: Internal Medicine

## 2012-08-25 NOTE — Telephone Encounter (Signed)
Bruising is normal, she is coming on Thurs for her wound check and we will assess thjen

## 2012-08-25 NOTE — Telephone Encounter (Signed)
New Problem    Had pacemaker put in on 1/30, pt said she has noticed some bruising going into her armpit and down her left breast. Would like to speak to nurse.

## 2012-08-28 ENCOUNTER — Ambulatory Visit (INDEPENDENT_AMBULATORY_CARE_PROVIDER_SITE_OTHER): Payer: BC Managed Care – PPO | Admitting: *Deleted

## 2012-08-28 DIAGNOSIS — I495 Sick sinus syndrome: Secondary | ICD-10-CM

## 2012-08-28 LAB — PACEMAKER DEVICE OBSERVATION
AL AMPLITUDE: 2.8 mv
AL IMPEDENCE PM: 575 Ohm
BAMS-0001: 150 {beats}/min
BATTERY VOLTAGE: 2.81 V
RV LEAD AMPLITUDE: 22.4 mv
RV LEAD IMPEDENCE PM: 662 Ohm
VENTRICULAR PACING PM: 0

## 2012-08-28 NOTE — Progress Notes (Signed)
Wound check-PPM 

## 2012-09-02 ENCOUNTER — Telehealth: Payer: Self-pay | Admitting: *Deleted

## 2012-09-02 ENCOUNTER — Other Ambulatory Visit: Payer: Self-pay | Admitting: Endocrinology

## 2012-09-02 DIAGNOSIS — R9389 Abnormal findings on diagnostic imaging of other specified body structures: Secondary | ICD-10-CM

## 2012-09-02 DIAGNOSIS — E041 Nontoxic single thyroid nodule: Secondary | ICD-10-CM

## 2012-09-02 NOTE — Telephone Encounter (Signed)
Schedule a carotid US per Dr. Lattie Haw for abnormal CT of neck.  Pt aware and is awaiting appointment.

## 2012-09-03 ENCOUNTER — Encounter (INDEPENDENT_AMBULATORY_CARE_PROVIDER_SITE_OTHER): Payer: BC Managed Care – PPO | Admitting: Surgery

## 2012-09-04 ENCOUNTER — Encounter (INDEPENDENT_AMBULATORY_CARE_PROVIDER_SITE_OTHER): Payer: BC Managed Care – PPO | Admitting: Surgery

## 2012-09-05 ENCOUNTER — Telehealth (INDEPENDENT_AMBULATORY_CARE_PROVIDER_SITE_OTHER): Payer: Self-pay

## 2012-09-05 NOTE — Telephone Encounter (Signed)
Spoke with pt, giving her her new appt date and time, set for Tuesday, March 4th @ 320p

## 2012-09-06 ENCOUNTER — Other Ambulatory Visit: Payer: Self-pay

## 2012-09-08 ENCOUNTER — Ambulatory Visit (INDEPENDENT_AMBULATORY_CARE_PROVIDER_SITE_OTHER): Payer: BC Managed Care – PPO | Admitting: Internal Medicine

## 2012-09-08 ENCOUNTER — Encounter: Payer: Self-pay | Admitting: Internal Medicine

## 2012-09-08 VITALS — BP 126/60 | HR 80 | Ht 63.0 in | Wt 131.0 lb

## 2012-09-08 DIAGNOSIS — Z950 Presence of cardiac pacemaker: Secondary | ICD-10-CM | POA: Insufficient documentation

## 2012-09-08 DIAGNOSIS — I4892 Unspecified atrial flutter: Secondary | ICD-10-CM

## 2012-09-08 LAB — PACEMAKER DEVICE OBSERVATION

## 2012-09-08 MED ORDER — METOPROLOL TARTRATE 25 MG PO TABS: 25.0000 mg | ORAL_TABLET | Freq: Two times a day (BID) | ORAL | Status: DC | PRN

## 2012-09-08 NOTE — Assessment & Plan Note (Signed)
Her palpitations are a new problem. Review of her pacemaker histograms demonstrates frequent PVCs. PVCs are almost certainly the etiology of her palpitations. I've encouraged the patient to take an additional beta blocker as needed, up to 100 mg of metoprolol daily. If she continues to have symptoms of palpitations despite increasing her beta blocker dose, we could consider antiarrhythmic drug therapy.

## 2012-09-08 NOTE — Progress Notes (Signed)
HPI [name] [name] returns today for followup. She is a very pleasant 62 year old woman with multiple medical problems who simply found to have marked bradycardia associated with syncope. She is status post insertion of a permanent pacemaker. Approximately 2 weeks ago, she began to experience palpitations. No associated syncope, chest pain, or shortness of breath. No change in dietary habits. She denies alcohol use or excess caffeine use. No change in sleep pattern. Allergies  Allergen Reactions  . Clindamycin/Lincomycin Shortness Of Breath and Swelling  . Gentamycin (Gentamicin) Hives and Itching    Reaction noted post loop recorder implant, where gentamycin was used for irrigation.  . Atorvastatin     Muscle and joint pain  . Avandia (Rosiglitazone Maleate)     Congestive heart failure  . Codeine Nausea And Vomiting  . Erythromycin Nausea And Vomiting  . Metformin     Congestive heart failure  . Simvastatin     Increased LFT's  . Cephalexin Swelling and Rash  . Iodinated Diagnostic Agents Other (See Comments)    PAIN DURING LYMPHANGIOGRAM '86, NO ALLERGY TO IV CONTRAST  . Neosporin (Neomycin-Bacitracin Zn-Polymyx) Rash  . Penicillins Swelling and Rash     Current Outpatient Prescriptions  Medication Sig Dispense Refill  . ALPRAZolam (XANAX) 0.25 MG tablet Take 1 tablet (0.25 mg total) by mouth at bedtime.  90 tablet  3  . aspirin EC 81 MG tablet Take 81 mg by mouth daily.      . Calcium Carbonate-Vitamin D (RA CALCIUM PLUS VITAMIN D) 600-400 MG-UNIT per tablet Take 1 tablet by mouth 2 (two) times daily.       . cholecalciferol (VITAMIN D) 1000 UNITS tablet Take 1,000 Units by mouth daily.        . Coenzyme Q10 (COQ10 PO) Take 400 mg by mouth daily.       Marland Kitchen desoximetasone (TOPICORT) 0.05 % cream Apply 1 application topically 2 (two) times daily as needed. When skin starts peeling      . ezetimibe (ZETIA) 10 MG tablet Take 1 tablet (10 mg total) by mouth daily.  90 tablet  3  .  fenofibrate (TRICOR) 145 MG tablet TAKE ONE (1) TABLET BY MOUTH EVERY DAY  30 tablet  6  . ferrous fumarate (FERRO-SEQUELS) 50 MG CR tablet Take 50 mg by mouth daily.      . furosemide (LASIX) 40 MG tablet Take 40 mg by mouth as needed.       . furosemide (LASIX) 80 MG tablet Take 80 mg by mouth daily.       . insulin glulisine (APIDRA) 100 UNIT/ML injection Inject into the skin daily. Sliding scale (insulin pump)      . Krill Oil Omega-3 300 MG CAPS Take 1 capsule by mouth daily.      . metoprolol tartrate (LOPRESSOR) 25 MG tablet TAKE ONE TABLET TWICE DAILY  60 tablet  6  . Multiple Vitamins-Minerals (MULTIVITAMIN WITH MINERALS) tablet Take 1 tablet by mouth daily.        . nitroGLYCERIN (NITROLINGUAL) 0.4 MG/SPRAY spray Place 1 spray under the tongue every 5 (five) minutes as needed.        Marland Kitchen omeprazole (PRILOSEC) 20 MG capsule Take 20 mg by mouth daily. Before breakfast as needed      . polyethylene glycol powder (GLYCOLAX/MIRALAX) powder Take 17 g by mouth daily. 1/2 - 1 dose every day.      . potassium chloride SA (K-DUR,KLOR-CON) 20 MEQ tablet Take 40 mEq by mouth daily.      Marland Kitchen  pravastatin (PRAVACHOL) 10 MG tablet TAKE ONE (1) TABLET BY MOUTH EVERY      DAY  90 tablet  0  . sitaGLIPtan (JANUVIA) 100 MG tablet Take 100 mg by mouth daily.        . vitamin C (ASCORBIC ACID) 500 MG tablet Take 500 mg by mouth daily.       No current facility-administered medications for this visit.     Past Medical History  Diagnosis Date  . Aortic stenosis      bicuspid aortic valve; mean gradient of 20 mmHg in 2/10; aortic valve   replacement surgery(19-mm Edwards pericardial valve) with a redo coronary artery bypass graft procedure      in 07/2009; postoperative Dressler's syndrome    . Coronary artery disease     status post RCA saphenous vein graft in 06/1999; multiple preceding PCIs with restenosis; total obstruction of RCA in 2006 with patent graft   . CHF (congestive heart failure)     History of  CHF; recurred following AVR/CABG surgery; intermittent PND  . Hyperlipidemia   . Hypertension   . Small bowel obstruction     Recurrent; resolved after resection of a lipoma  . Osteopenia      hip on DEXA in October 2007.  Marland Kitchen Anemia   . Carcinoma, renal cell 05/2007    Laparoscopic right nephrectomy  . NASH (nonalcoholic steatohepatitis) 1999    -biopsy in 1999  . Diabetes mellitus 03/2010     TYPE II: Hemoglobin A1c of 7.4 in  03/2010; 8.4 in 06/2010; treated with insulin pump  . Migraines   . Duodenal ulcer     Remote; H. pylori positive  . Colitis, ischemic 2008  . Gastroesophageal reflux disease     ? of GASTROPARESIS; IRRITABLE BOWEL SYNDROME  . Hodgkin's disease 1991    Mantle radiation therapy  . Exertional dyspnea     chest discomfort  . Statin intolerance   . Hypercalcemia     Ca-11.1 in 07/2011  . Vitamin D deficiency   . Costochondritis   . Insomnia   . Anemia   . Gastroparesis due to DM 05/01/2012  . PONV (postoperative nausea and vomiting)   . Hyperplastic gastric polyp 06/23/2012  . Arthritis   . Heart murmur   . Osteoporosis   . Pacemaker 08/21/2012    Dr Lovena Le    ROS:   All systems reviewed and negative except as noted in the HPI.   Past Surgical History  Procedure Laterality Date  . Total abdominal hysterectomy w/ bilateral salpingoophorectomy  2000  . Breast excisional biopsy      benign, bilateral  . Nephrectomy  2008    Laparoscopic right nephrectomy, renal cell carcinoma  . Coronary artery bypass graft  2005, 2011    CABG-most recent SVG to RCA-12/05; RCA occluded with patent graft in 2006; Redo bypass in 07/2009  . Tumor excision  1981    removal of Hodgkins lymphoma  . Exploratory laparotomy w/ bowel resection      Small bowel resection of lipoma  . Aortic valve replacement  2011    Aortic valve replacement surgery with a 19 mm bioprosthetic device post redo CABG January 2011.  . Colonoscopy      Multiple with adenomatous polyps  .  Cervical biopsy    . Liver biopsy    . Bone marrow biopsy    . Esophagogastroduodenoscopy    . Esophagogastroduodenoscopy  06/23/2012    Procedure: ESOPHAGOGASTRODUODENOSCOPY (EGD);  Surgeon: Glendell Docker  Simonne Maffucci, MD;  Location: [facility];  Service: Endoscopy;  Laterality: N/A;  . Colonoscopy  06/23/2012    Procedure: COLONOSCOPY;  Surgeon: Gatha Mayer, MD;  Location: [facility];  Service: Endoscopy;  Laterality: N/A;  [name] dilation  06/23/2012    Procedure: [name] DILATION;  Surgeon: Gatha Mayer, MD;  Location: [facility];  Service: Endoscopy;;  56 fr  . Insert / replace / remove pacemaker       Family History  Problem Relation Age of Onset  . Heart attack Mother   . Heart disease Mother   . Heart attack Father   . Diabetes Father   . Heart disease Father   . Hypertension Brother   . Glaucoma Brother   . Lung cancer Sister     meta  . Cancer Sister     breast - half sister (Paternal)  . Ovarian cancer Sister   . Cancer Sister     cervical and lung - half sister (maternal)  . Breast cancer Sister   . Colon cancer Neg Hx   . Stomach cancer Neg Hx      History   Social History  . Marital Status: Married    Spouse Name: N/A    Number of Children: 2  . Years of Education: N/A   Occupational History  . full time     elementary principal   Social History Main Topics  . Smoking status: Never Smoker   . Smokeless tobacco: Never Used  . Alcohol Use: No  . Drug Use: No  . Sexually Active: Not on file   Other Topics Concern  . 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     BP 126/60  Pulse 80  Ht 5' 3"  (1.6 m)  Wt 131 lb (59.421 kg)  BMI 23.21 kg/m2  Physical Exam:  Well appearing middle-age woman,NAD HEENT: Unremarkable Neck:  6 cm JVD, no thyromegally Lungs:  Clearwith no wheezes, rales, or rhonchi. Well-healed pacemaker incision. HEART:  Regular rate rhythm, grade 2/6 systolic  murmur, no rubs, no clicks Abd:  soft, positive bowel sounds, no organomegally, no rebound, no guarding Ext:  2 plus pulses, no edema, no cyanosis, no clubbing Skin:  No rashes no nodules Neuro:  CN II through XII intact, motor grossly intact  DEVICE  Normal device function.  See PaceArt for details.   Assess/Plan:

## 2012-09-08 NOTE — Patient Instructions (Addendum)
Your physician recommends that you schedule a follow-up appointment in: [place] 12-02-12 AT [place] physician has recommended you make the following change in your medication:   1) TAKE EXTRA METOPROLOL UP TO 100MG AS NEEDED FOR PALPATIONS

## 2012-09-09 ENCOUNTER — Ambulatory Visit
Admission: RE | Admit: 2012-09-09 | Discharge: 2012-09-09 | Disposition: A | Discharge status: Home / Self Care | Payer: BC Managed Care – PPO | Source: Ambulatory Visit | Attending: Endocrinology | Admitting: Endocrinology

## 2012-09-09 DIAGNOSIS — E041 Nontoxic single thyroid nodule: Secondary | ICD-10-CM

## 2012-09-17 ENCOUNTER — Other Ambulatory Visit: Payer: Self-pay | Admitting: Cardiology

## 2012-09-18 ENCOUNTER — Ambulatory Visit (HOSPITAL_COMMUNITY)
Admission: RE | Admit: 2012-09-18 | Discharge: 2012-09-18 | Disposition: A | Discharge status: Home / Self Care | Payer: BC Managed Care – PPO | Source: Ambulatory Visit | Attending: Cardiology | Admitting: Cardiology

## 2012-09-18 DIAGNOSIS — I6529 Occlusion and stenosis of unspecified carotid artery: Secondary | ICD-10-CM | POA: Insufficient documentation

## 2012-09-18 DIAGNOSIS — R9389 Abnormal findings on diagnostic imaging of other specified body structures: Secondary | ICD-10-CM | POA: Insufficient documentation

## 2012-09-21 ENCOUNTER — Encounter: Payer: Self-pay | Admitting: Cardiology

## 2012-09-23 ENCOUNTER — Ambulatory Visit (INDEPENDENT_AMBULATORY_CARE_PROVIDER_SITE_OTHER): Payer: BC Managed Care – PPO | Admitting: Surgery

## 2012-09-23 ENCOUNTER — Encounter (INDEPENDENT_AMBULATORY_CARE_PROVIDER_SITE_OTHER): Payer: Self-pay | Admitting: Surgery

## 2012-09-23 VITALS — BP 138/64 | HR 76 | Temp 98.1°F | Resp 12 | Ht 63.0 in | Wt 128.0 lb

## 2012-09-23 DIAGNOSIS — D170 Benign lipomatous neoplasm of skin and subcutaneous tissue of head, face and neck: Secondary | ICD-10-CM

## 2012-09-23 DIAGNOSIS — D1739 Benign lipomatous neoplasm of skin and subcutaneous tissue of other sites: Secondary | ICD-10-CM

## 2012-09-23 NOTE — Progress Notes (Signed)
#  1 we discussed the mass at the C7-T1 region. I reviewed the CT scan pictures with her. This is fatty deposits and not a discrete lipoma. I think to remove this would require intubation and prone positioning and I think that's much to much to just remove this area which is minimally symptomatic. He does not appear to be a sarcoma CT or physical exam. I think he deserves watching and if it increases in size and we he consider removing it.  #2 right midabdomen hernia from right nephrectomy performed by Dr. Alinda Money about 5 years ago where the hand port was used. This bulges with standing but reduces. In addition she has a little diastases in the midline grab operating room for many years ago. I would observe these.  #3 we discussed the thyroid lesions including the 1 cm one in her right thyroid. She is scheduled for repeat imaging in one year. She will let me know about this at that time.  #4 gallstones currently asymptomatic will consider repairing hernia primarily and doing cholecystectomy at the same sitting if it becomes necessary.  Plan return when necessary  Matt B. Hassell Done, MD, [facility] [phone] beeper [phone]  09/23/2012 4:34 PM

## 2012-09-23 NOTE — Patient Instructions (Signed)
Will follow Mass on back of neck as needed. Followup thyroid ultrasound as directed by radiology We'll follow right abdominal hernia symptomatically.

## 2012-10-01 ENCOUNTER — Encounter: Payer: Self-pay | Admitting: Internal Medicine

## 2012-10-18 ENCOUNTER — Telehealth: Payer: Self-pay | Admitting: Physician Assistant

## 2012-10-18 NOTE — Telephone Encounter (Signed)
Agree with advice given

## 2012-10-18 NOTE — Telephone Encounter (Signed)
Pt c/o significant DOE. It has increased to the point that she cannot walk around in the house without feeling SOB. Last pm, she ate out and she describes PND and orthopnea. She also had some chest tightness. Took multiple meds including an extra Lasix for this. Today, she feels a little better. Wants to know if she should take her usual meds. Her weight is up 3 pounds.  Advised her the best thing would be to go to the closest emergency room. She is reluctant to do that. She is feeling better, and wants to manage this at home. Advised her OK to take her usual meds, make sure to take the Lasix but OK to delay the metoprolol since she took an extra one at 4 am.   Advised her that if symptoms do not significantly improve, she will need to come in. Pt agreed to this plan.

## 2012-10-19 ENCOUNTER — Other Ambulatory Visit: Payer: Self-pay | Admitting: Adult Health

## 2012-10-20 ENCOUNTER — Telehealth: Payer: Self-pay | Admitting: Cardiology

## 2012-10-20 NOTE — Telephone Encounter (Signed)
Pt states that she was having CHF symptoms this weekend and spoke with Suanne Marker over the weekend. States that Suanne Marker wanted her to have some labs done because of all the extra medications she took over the weekend then to f/u with [facility].  I offered her a [facility] appointment, but she states she could wait to see [facility] his next availible is not for three weeks.

## 2012-10-20 NOTE — Telephone Encounter (Signed)
Appointment made for tomorrow with Dr Lattie Haw

## 2012-10-21 ENCOUNTER — Ambulatory Visit (INDEPENDENT_AMBULATORY_CARE_PROVIDER_SITE_OTHER): Payer: BC Managed Care – PPO | Admitting: Cardiology

## 2012-10-21 ENCOUNTER — Ambulatory Visit (HOSPITAL_COMMUNITY)
Admission: RE | Admit: 2012-10-21 | Discharge: 2012-10-21 | Disposition: A | Discharge status: Home / Self Care | Payer: BC Managed Care – PPO | Source: Ambulatory Visit | Attending: Cardiology | Admitting: Cardiology

## 2012-10-21 ENCOUNTER — Encounter: Payer: Self-pay | Admitting: Cardiology

## 2012-10-21 ENCOUNTER — Encounter: Payer: Self-pay | Admitting: *Deleted

## 2012-10-21 VITALS — BP 134/86 | HR 81 | Ht 63.0 in | Wt 128.0 lb

## 2012-10-21 DIAGNOSIS — R002 Palpitations: Secondary | ICD-10-CM

## 2012-10-21 DIAGNOSIS — E119 Type 2 diabetes mellitus without complications: Secondary | ICD-10-CM | POA: Insufficient documentation

## 2012-10-21 DIAGNOSIS — I4892 Unspecified atrial flutter: Secondary | ICD-10-CM

## 2012-10-21 DIAGNOSIS — E785 Hyperlipidemia, unspecified: Secondary | ICD-10-CM

## 2012-10-21 DIAGNOSIS — I10 Essential (primary) hypertension: Secondary | ICD-10-CM

## 2012-10-21 DIAGNOSIS — R0602 Shortness of breath: Secondary | ICD-10-CM

## 2012-10-21 DIAGNOSIS — I2510 Atherosclerotic heart disease of native coronary artery without angina pectoris: Secondary | ICD-10-CM

## 2012-10-21 DIAGNOSIS — D649 Anemia, unspecified: Secondary | ICD-10-CM

## 2012-10-21 DIAGNOSIS — I7090 Unspecified atherosclerosis: Secondary | ICD-10-CM

## 2012-10-21 DIAGNOSIS — I350 Nonrheumatic aortic (valve) stenosis: Secondary | ICD-10-CM

## 2012-10-21 DIAGNOSIS — Z951 Presence of aortocoronary bypass graft: Secondary | ICD-10-CM | POA: Insufficient documentation

## 2012-10-21 DIAGNOSIS — I509 Heart failure, unspecified: Secondary | ICD-10-CM

## 2012-10-21 DIAGNOSIS — I359 Nonrheumatic aortic valve disorder, unspecified: Secondary | ICD-10-CM

## 2012-10-21 MED ORDER — FUROSEMIDE 80 MG PO TABS: 80.0000 mg | ORAL_TABLET | ORAL | Status: DC | PRN

## 2012-10-21 NOTE — Progress Notes (Deleted)
Name: [name] [name]    DOB: 12/24/1950  Age: 62 y.o.  MR#: [id number]       PCP:  Leonides Grills, MD      Insurance: Payor: [employer]: [employer] P[employer]  Product Type: *No Product type*    CC:   No chief complaint on file.  MEDICATION LIST CONSULTED WITH RHONDA BARRETT ON 3/29 WITH EDEMA AND SOB.  SEE TFN UNABLE TO LAY FLAT AT NIGHT AND IS EXPERIENCING SOB AND CHEST TIGHTNESS WITH MINIMAL EXERTION  VS Filed Vitals:   10/21/12 1312  BP: 134/86  Pulse: 81  Height: 5' 3"  (1.6 m)  Weight: 128 lb (58.06 kg)    Weights Current Weight  10/21/12 128 lb (58.06 kg)  09/23/12 128 lb (58.06 kg)  09/08/12 131 lb (59.421 kg)    Blood Pressure  BP Readings from Last 3 Encounters:  10/21/12 134/86  09/23/12 138/64  09/08/12 126/60     Admit date:  (Not on file) Last encounter with RMR:  10/20/2012   Allergy Clindamycin/lincomycin; Gentamycin; Atorvastatin; Avandia; Codeine; Erythromycin; Metformin; Simvastatin; Cephalexin; Iodinated diagnostic agents; Neosporin; and Penicillins  Current Outpatient Prescriptions  Medication Sig Dispense Refill  . ALPRAZolam (XANAX) 0.25 MG tablet Take 1 tablet (0.25 mg total) by mouth at bedtime.  90 tablet  3  . aspirin EC 81 MG tablet Take 81 mg by mouth daily.      . Calcium Carbonate-Vitamin D (RA CALCIUM PLUS VITAMIN D) 600-400 MG-UNIT per tablet Take 1 tablet by mouth 2 (two) times daily.       . cholecalciferol (VITAMIN D) 1000 UNITS tablet Take 1,000 Units by mouth daily.        . Coenzyme Q10 (COQ10 PO) Take 400 mg by mouth daily.       Marland Kitchen desoximetasone (TOPICORT) 0.05 % cream Apply 1 application topically 2 (two) times daily as needed. When skin starts peeling      . ezetimibe (ZETIA) 10 MG tablet Take 1 tablet (10 mg total) by mouth daily.  90 tablet  3  . fenofibrate (TRICOR) 145 MG tablet TAKE ONE (1) TABLET BY MOUTH EVERY DAY  30 tablet  6  . ferrous fumarate (FERRO-SEQUELS) 50 MG CR tablet Take 50 mg by mouth  daily.      . furosemide (LASIX) 40 MG tablet Take 40 mg by mouth as needed.       . furosemide (LASIX) 80 MG tablet Take 80 mg by mouth daily.       . insulin glulisine (APIDRA) 100 UNIT/ML injection Inject into the skin daily. Sliding scale (insulin pump)      . Krill Oil Omega-3 300 MG CAPS Take 1 capsule by mouth daily.      . metoprolol tartrate (LOPRESSOR) 25 MG tablet Take 1 tablet (25 mg total) by mouth 2 (two) times daily as needed.  120 tablet  6  . Multiple Vitamins-Minerals (MULTIVITAMIN WITH MINERALS) tablet Take 1 tablet by mouth daily.        Marland Kitchen NITROLINGUAL 0.4 MG/SPRAY spray USE ONE SPRAY UNDER TONGUE EVERY 5 MINUTES AS NEEDED FOR CHEST PAIN. MAY REPEAT THREE TIMES  4.9 g  3  . omeprazole (PRILOSEC) 20 MG capsule Take 20 mg by mouth daily. Before breakfast as needed      . polyethylene glycol powder (GLYCOLAX/MIRALAX) powder Take 17 g by mouth daily. 1/2 - 1 dose every day.      . potassium chloride SA (K-DUR,KLOR-CON) 20 MEQ  tablet Take 40 mEq by mouth daily.      . pravastatin (PRAVACHOL) 10 MG tablet TAKE ONE (1) TABLET BY MOUTH EVERY DAY  90 tablet  3  . sitaGLIPtan (JANUVIA) 100 MG tablet Take 100 mg by mouth daily.        . vitamin C (ASCORBIC ACID) 500 MG tablet Take 500 mg by mouth daily.       No current facility-administered medications for this visit.    Discontinued Meds:   There are no discontinued medications.  Patient Active Problem List  Diagnosis  . Hyperlipidemia  . Colonic polyps, adenomatous  . Hypertension  . Small bowel obstruction  . CHF (congestive heart failure)  . Aortic stenosis status post 19 mm Edwards pericardial valve replacement 2011  . Arteriosclerotic cardiovascular disease (ASCVD)  . Osteopenia  . Anemia, normocytic normochromic  . NASH (nonalcoholic steatohepatitis)  . Renal cell carcinoma  . Diabetes mellitus type 2, controlled  . Duodenal ulcer  . Ischemic colitis  . Gastroesophageal reflux disease  . Hodgkin's disease  .  Cholelithiasis  . Hypercalcemia  . Near syncope  . Gastroparesis due to DM  . Hyperplastic gastric polyp  . Sick sinus syndrome  . Atrial flutter  . Chronic kidney disease, stage 3/cardiorenal syndrome  . Palpitations  . Pacemaker  . Lipoma of neck    LABS    Component Value Date/Time   NA 143 08/14/2012 0900   NA 141 02/06/2012 1118   NA 138 11/30/2011 2340   K 3.9 08/14/2012 0900   K 4.0 02/06/2012 1118   K 3.9 11/30/2011 2340   CL 106 08/14/2012 0900   CL 100 02/06/2012 1118   CL 101 11/30/2011 2340   CO2 29 08/14/2012 0900   CO2 31 02/06/2012 1118   CO2 24 11/30/2011 2340   GLUCOSE 98 08/14/2012 0900   GLUCOSE 106* 02/06/2012 1118   GLUCOSE 203* 11/30/2011 2340   BUN 30* 08/14/2012 0900   BUN 34* 02/06/2012 1118   BUN 37* 11/30/2011 2340   CREATININE 1.0 08/14/2012 0900   CREATININE 1.20* 02/06/2012 1118   CREATININE 1.01 11/30/2011 2340   CREATININE 1.20* 09/19/2011 1603   CREATININE 1.20* 09/12/2011 0000   CREATININE 1.43* 07/30/2011 0855   CALCIUM 10.1 08/14/2012 0900   CALCIUM 10.7* 02/06/2012 1118   CALCIUM 10.2 11/30/2011 2340   GFRNONAA 60* 11/30/2011 2340   GFRNONAA 51* 08/10/2010 0504   GFRNONAA 50* 08/09/2010 0546   GFRAA 70* 11/30/2011 2340   GFRAA  Value: >60        The eGFR has been calculated using the MDRD equation. This calculation has not been validated in all clinical situations. eGFR's persistently <60 mL/min signify possible Chronic Kidney Disease. 08/10/2010 0504   GFRAA  Value: >60        The eGFR has been calculated using the MDRD equation. This calculation has not been validated in all clinical situations. eGFR's persistently <60 mL/min signify possible Chronic Kidney Disease. 08/09/2010 0546   CMP     Component Value Date/Time   NA 143 08/14/2012 0900   K 3.9 08/14/2012 0900   CL 106 08/14/2012 0900   CO2 29 08/14/2012 0900   GLUCOSE 98 08/14/2012 0900   BUN 30* 08/14/2012 0900   CREATININE 1.0 08/14/2012 0900   CREATININE 1.20* 09/19/2011 1603   CALCIUM 10.1 08/14/2012  0900   PROT 8.4* 02/06/2012 1118   ALBUMIN 5.0 02/06/2012 1118   AST 36 02/06/2012 1118   ALT 32 02/06/2012  1118   ALKPHOS 84 02/06/2012 1118   BILITOT 0.4 02/06/2012 1118   GFRNONAA 60* 11/30/2011 2340   GFRAA 70* 11/30/2011 2340       Component Value Date/Time   WBC 7.9 08/14/2012 0900   WBC 8.8 02/06/2012 1118   WBC 10.4 11/30/2011 2340   WBC 10.8* 09/12/2011 0000   WBC 8.6 01/04/2011 1514   WBC 11.7* 01/14/2009 0911   HGB 11.6* 08/14/2012 0900   HGB 12.5 02/06/2012 1118   HGB 11.8* 11/30/2011 2340   HGB 11.4* 09/12/2011 0000   HGB 12.9 01/04/2011 1514   HGB 12.3 01/14/2009 0911   HCT 34.9* 08/14/2012 0900   HCT 38.1 02/06/2012 1118   HCT 36.1 11/30/2011 2340   HCT 37.5 09/12/2011 0000   HCT 38.5 01/04/2011 1514   HCT 36.3 01/14/2009 0911   MCV 86.4 08/14/2012 0900   MCV 86.1 02/06/2012 1118   MCV 84.5 11/30/2011 2340   MCV 90.1 09/12/2011 0000   MCV 88.0 01/04/2011 1514   MCV 89.1 01/14/2009 0911    Lipid Panel     Component Value Date/Time   CHOL 234* 04/18/2011 1515   TRIG 152* 04/18/2011 1515   HDL 50 04/18/2011 1515   CHOLHDL 4.7 04/18/2011 1515   VLDL 30 04/18/2011 1515   LDLCALC 154* 04/18/2011 1515    ABG    Component Value Date/Time   PHART 7.305* 07/27/2009 2318   PCO2ART 49.5* 07/27/2009 2318   PO2ART 87.0 07/27/2009 2318   HCO3 24.2* 07/27/2009 2318   TCO2 26 09/01/2009 2004   ACIDBASEDEF 2.0 07/27/2009 2318   O2SAT 95.0 07/27/2009 2318     Lab Results  Component Value Date   TSH 2.711 07/30/2011   BNP (last 3 results)  Recent Labs  11/30/11 2346  PROBNP 326.6*   Cardiac Panel (last 3 results) No results found for this basename: CKTOTAL, CKMB, TROPONINI, RELINDX,  in the last 72 hours  Iron/TIBC/Ferritin    Component Value Date/Time   IRON 116 01/04/2011 1514   TIBC 401 01/04/2011 1514   FERRITIN 172 01/04/2011 1514     EKG Orders placed in visit on 10/21/12  . EKG 12-LEAD     Prior Assessment and Plan Problem List as of 10/21/2012     ICD-9-CM     Cardiology Problems    Hyperlipidemia   Last Assessment & Plan   09/19/2011 Office Visit Edited 09/23/2011  8:02 AM by Yehuda Savannah, MD     Most recent lipid profile is suboptimal, but patient is intolerant of high-dose statin therapy.  I have suspected that coronary disease is related to radiation therapy rather than ASCVD.  She has had single vessel disease without apparent progression over the past 10 years.  I think it likely that she will not be troubled by coronary disease in the future regardless of the adequacy of control of hyperlipidemia.  Nonetheless, we will use alternative medication to lower her cholesterol to the extent possible.    Hypertension   Last Assessment & Plan   07/21/2012 Office Visit Written 07/21/2012 12:45 PM by Yehuda Savannah, MD     Only a single elevated systolic blood pressure of 160 recorded over the past year with all diastolics normal.  Hypertension is under excellent control.    CHF (congestive heart failure)   Last Assessment & Plan   07/21/2012 Office Visit Written 07/21/2012 12:40 PM by Yehuda Savannah, MD     CHF has been compensated with current medical regime.  We will continue to monitor renal function and serum electrolytes.    Aortic stenosis status post 19 mm Edwards pericardial valve replacement 2011   Last Assessment & Plan   12/06/2011 Office Visit Edited 12/09/2011 10:45 PM by Yehuda Savannah, MD     Patient required a small prosthetic valve and likely has a component of valve-patient mismatch.  Valve sounds are crisp without evidence for thrombosis or dysfunction.    Arteriosclerotic cardiovascular disease (ASCVD)   Last Assessment & Plan   03/12/2012 Office Visit Written 03/12/2012 10:01 AM by Evans Lance, MD     She denies anginal symptoms. I've encouraged the patient to increase her physical activity and continue her current medical therapy.    Near syncope   Last Assessment & Plan   07/22/2012 Office Visit Written 07/23/2012  9:13 PM by Evans Lance, MD     She appears to be having syncope due to post termination pauses out of atrial flutter. I have discussed the treatment options with the patient and her family. While she appears to be having atrial flutter, she is at risk for atrial fib as well. We have no good way to prevent her pauses other than placing a PPM. I have discussed the risks/benefits/goals/expectations of PPM with the patient and she wishes to proceed.    Sick sinus syndrome   Last Assessment & Plan   07/21/2012 Office Visit Written 07/21/2012 12:46 PM by Yehuda Savannah, MD     Paroxysmal atrial fibrillation with rapid ventricular response and sinus pauses upon conversion to normal sinus rhythm.  Scheduled for evaluation by Dr. Lovena Le on 12/31 and will likely require permanent pacing.  She is refraining from driving at present.  The importance of avoiding falls should she experience recurrent lightheadedness or syncope was discussed with her and her husband.    Atrial flutter   Last Assessment & Plan   07/22/2012 Office Visit Written 07/23/2012  9:17 PM by Evans Lance, MD     I would not recommend catheter ablation at this time. Once her PPM is in place, we can better monitor her atrial arrhythmias. If we see that she is having sustained atrial fib/flutter, she will need to be placed on systemic anti-coagulation.      Other   Hodgkin's disease   Colonic polyps, adenomatous   Small bowel obstruction   Osteopenia   Last Assessment & Plan   09/19/2011 Office Visit Written 09/19/2011  4:41 PM by Yehuda Savannah, MD     Osteopenia could be related to borderline hyperparathyroidism.  Bone densitometry was last performed 2 years ago.  A repeat study would be of interest.    Anemia, normocytic normochromic   Last Assessment & Plan   07/21/2012 Office Visit Written 07/21/2012 12:35 PM by Yehuda Savannah, MD     Normal CBC in 01/2012.  Anemia resolved.    NASH (nonalcoholic steatohepatitis)   Last Assessment & Plan    09/19/2011 Office Visit Written 09/19/2011  4:40 PM by Yehuda Savannah, MD     LFTs have been minimally abnormal on recent determinations, but have returned to normal as of testing in 08/2011.    Renal cell carcinoma   Diabetes mellitus type 2, controlled   Last Assessment & Plan   03/06/2012 Office Visit Written 03/09/2012 10:40 PM by Yehuda Savannah, MD     Insulin requirement has been much higher recently than in the past, but diabetic control remains suboptimal.  Endocrinology is working very effectively and closely with Ms. Milledge to optimize treatment.    Duodenal ulcer   Ischemic colitis   Gastroesophageal reflux disease   Cholelithiasis   Hypercalcemia   Last Assessment & Plan   09/19/2011 Office Visit Edited 09/23/2011  8:01 AM by Yehuda Savannah, MD     Calcium level has decreased from a peak of 11.1 to 10.6.  Dr. Eden Emms has reviewed laboratory studies and advised patient that there is inadequate evidence for true hyperparathyroidism.  She does appear to have at least a borderline abnormality with a low normal PTH level.  Borderline hypercalcemia was likely exacerbated by administration of vitamin D and calcium plus a degree of acute renal failure associated with ACE inhibitor.    Gastroparesis due to DM   Hyperplastic gastric polyp   Chronic kidney disease, stage 3/cardiorenal syndrome   Palpitations   Last Assessment & Plan   09/08/2012 Office Visit Written 09/08/2012 10:41 AM by Evans Lance, MD     Her palpitations are a new problem. Review of her pacemaker histograms demonstrates frequent PVCs. PVCs are almost certainly the etiology of her palpitations. I've encouraged the patient to take an additional beta blocker as needed, up to 100 mg of metoprolol daily. If she continues to have symptoms of palpitations despite increasing her beta blocker dose, we could consider antiarrhythmic drug therapy.    Pacemaker   Lipoma of neck       Imaging: No results  found.

## 2012-10-21 NOTE — Patient Instructions (Addendum)
Your physician recommends that you schedule a follow-up appointment in: 3 months  A chest x-ray takes a picture of the organs and structures inside the chest, including the heart, lungs, and blood vessels. This test can show several things, including, whether the heart is enlarges; whether fluid is building up in the lungs; and whether pacemaker / defibrillator leads are still in place.  Your physician recommends that you return for lab work in: Today and in 2 months  Your physician has recommended you make the following change in your medication:  1 - INCREASE As needed dose of Lasix to 80 mg  Your physician has requested that you have an echocardiogram. Echocardiography is a painless test that uses sound waves to create images of your heart. It provides your doctor with information about the size and shape of your heart and how well your heart's chambers and valves are working. This procedure takes approximately one hour. There are no restrictions for this procedure.

## 2012-10-22 ENCOUNTER — Encounter: Payer: Self-pay | Admitting: Cardiology

## 2012-10-22 LAB — CBC
MCH: 27.2 pg (ref 26.0–34.0)
MCHC: 31.3 g/dL (ref 30.0–36.0)
Platelets: 231 10*3/uL (ref 150–400)
RBC: 3.9 MIL/uL (ref 3.87–5.11)

## 2012-10-22 LAB — BASIC METABOLIC PANEL
BUN: 41 mg/dL — ABNORMAL HIGH (ref 6–23)
Calcium: 9.7 mg/dL (ref 8.4–10.5)
Chloride: 101 mEq/L (ref 96–112)
Creat: 1.44 mg/dL — ABNORMAL HIGH (ref 0.50–1.10)

## 2012-10-22 LAB — D-DIMER, QUANTITATIVE: D-Dimer, Quant: 0.8 ug/mL-FEU — ABNORMAL HIGH (ref 0.00–0.48)

## 2012-10-22 NOTE — Assessment & Plan Note (Signed)
Adequate control of hyperlipidemia, particularly when coronary artery disease likely to be primarily the result of radiation damage; however, some degree of cerebrovascular disease is present as well.

## 2012-10-22 NOTE — Assessment & Plan Note (Addendum)
Recurrent CHF, likely multifactorial. Current contributors include diastolic dysfunction, patient valve mismatch following AVR, anemia, chronic kidney disease, etc.  Patient advised to be more proactive about increasing furosemide whenever weight gain noted. We will reassess status of CHF control with chest x-ray, and basic laboratory studies including a BNP level.

## 2012-10-22 NOTE — Assessment & Plan Note (Addendum)
Chest tightness is probably related directly to pulmonary edema. I doubt recurrent myocardial ischemia, and no provocative testing is necessary at present.

## 2012-10-22 NOTE — Assessment & Plan Note (Signed)
Adequate control of hypertension. Current medication will be continued.

## 2012-10-22 NOTE — Assessment & Plan Note (Signed)
There is likely a component of patient-valve mismatch as well as the murmur of aortic insufficiency. Cardiac status will be reassessed with echocardiography.

## 2012-10-22 NOTE — Progress Notes (Signed)
Patient ID: [name] [name], female   DOB: [birth year], 62 y.o.   MRN: [id number]  HPI: Requested return visit for this very nice woman, recently retired as the result of multiple medical and cardiac issues, most stemming from treatment for Hodgkin's lymphoma many years ago. Over the past few weeks, she noted increasing dyspnea on exertion and decreased exercise tolerance. A modest weight gain of perhaps 4 pounds was followed by respiratory distress and signs and symptoms previously associated with pulmonary edema. Patient temporally increased her dose of furosemide with improvement in symptoms. Weight is back to baseline. Current Outpatient Prescriptions  Medication Sig Dispense Refill  . ALPRAZolam (XANAX) 0.25 MG tablet Take 1 tablet (0.25 mg total) by mouth at bedtime.  90 tablet  3  . aspirin EC 81 MG tablet Take 81 mg by mouth daily.      . Calcium Carbonate-Vitamin D (RA CALCIUM PLUS VITAMIN D) 600-400 MG-UNIT per tablet Take 1 tablet by mouth 2 (two) times daily.       . cholecalciferol (VITAMIN D) 1000 UNITS tablet Take 1,000 Units by mouth daily.        . Coenzyme Q10 (COQ10 PO) Take 400 mg by mouth daily.       Marland Kitchen desoximetasone (TOPICORT) 0.05 % cream Apply 1 application topically 2 (two) times daily as needed. When skin starts peeling      . ezetimibe (ZETIA) 10 MG tablet Take 1 tablet (10 mg total) by mouth daily.  90 tablet  3  . fenofibrate (TRICOR) 145 MG tablet TAKE ONE (1) TABLET BY MOUTH EVERY DAY  30 tablet  6  . ferrous fumarate (FERRO-SEQUELS) 50 MG CR tablet Take 50 mg by mouth daily.      . furosemide (LASIX) 80 MG tablet Take 80 mg by mouth daily.       . furosemide (LASIX) 80 MG tablet Take 1 tablet (80 mg total) by mouth as needed.  30 tablet  6  . insulin glulisine (APIDRA) 100 UNIT/ML injection Inject into the skin daily. Sliding scale (insulin pump)      . Krill Oil Omega-3 300 MG CAPS Take 1 capsule by mouth daily.      . metoprolol tartrate (LOPRESSOR) 25 MG tablet  Take 1 tablet (25 mg total) by mouth 2 (two) times daily as needed.  120 tablet  6  . Multiple Vitamins-Minerals (MULTIVITAMIN WITH MINERALS) tablet Take 1 tablet by mouth daily.        Marland Kitchen NITROLINGUAL 0.4 MG/SPRAY spray USE ONE SPRAY UNDER TONGUE EVERY 5 MINUTES AS NEEDED FOR CHEST PAIN. MAY REPEAT THREE TIMES  4.9 g  3  . omeprazole (PRILOSEC) 20 MG capsule Take 20 mg by mouth daily. Before breakfast as needed      . polyethylene glycol powder (GLYCOLAX/MIRALAX) powder Take 17 g by mouth daily. 1/2 - 1 dose every day.      . potassium chloride SA (K-DUR,KLOR-CON) 20 MEQ tablet Take 40 mEq by mouth daily.      . pravastatin (PRAVACHOL) 10 MG tablet TAKE ONE (1) TABLET BY MOUTH EVERY DAY  90 tablet  3  . sitaGLIPtan (JANUVIA) 100 MG tablet Take 100 mg by mouth daily.        . vitamin C (ASCORBIC ACID) 500 MG tablet Take 500 mg by mouth daily.       No current facility-administered medications for this visit.   Allergies  Allergen Reactions  . Clindamycin/Lincomycin Shortness Of Breath and Swelling  . Gentamycin (Gentamicin)  Hives and Itching    Reaction noted post loop recorder implant, where gentamycin was used for irrigation.  . Atorvastatin     Muscle and joint pain  . Avandia (Rosiglitazone Maleate)     Congestive heart failure  . Codeine Nausea And Vomiting  . Erythromycin Nausea And Vomiting  . Metformin     Congestive heart failure  . Simvastatin     Increased LFT's  . Cephalexin Swelling and Rash  . Iodinated Diagnostic Agents Other (See Comments)    PAIN DURING LYMPHANGIOGRAM '86, NO ALLERGY TO IV CONTRAST  . Neosporin (Neomycin-Bacitracin Zn-Polymyx) Rash  . Penicillins Swelling and Rash     Past medical history, social history, and family history reviewed and updated.  ROS: Orthopnea and PND have been chronic; no recent nor passed pedal edema; chest tightness associated with dyspnea. Appetite has been stable. Control of diabetes has been adequate. All other systems are  addressed and are negative.  PHYSICAL EXAM: BP 134/86  Pulse 81  Ht 5' 3"  (1.6 m)  Wt 58.06 kg (128 lb)  BMI 22.68 kg/m2;  Body mass index is 22.68 kg/(m^2). General-Well developed; no acute distress Body habitus-proportionate weight and height Neck-No JVD; carotid bruits versus transmitted murmur bilaterally Lungs-clear lung fields; resonant to percussion; mild to moderate kyphosis; median sternotomy scar; pacemaker generator in the left infraclavicular region Cardiovascular-normal PMI; normal S1 and S2; grade 6-1/2 basilar systolic ejection murmur; grade 2-4/4 diastolic decrescendo murmur Abdomen-normal bowel sounds; soft and non-tender without masses or organomegaly Musculoskeletal-No deformities, no cyanosis or clubbing Neurologic-Normal cranial nerves; symmetric strength and tone Skin-Warm, no significant lesions Extremities-distal pulses intact; no edema  EKG: Normal sinus rhythm; profound first-degree AV block; left atrial abnormality; possible LVH.  [name], MD 10/22/2012  8:51 AM  ASSESSMENT AND PLAN

## 2012-10-22 NOTE — Assessment & Plan Note (Signed)
Stable renal function with stage III impairment.

## 2012-10-22 NOTE — Assessment & Plan Note (Signed)
Patient is experiencing palpitations; pacemaker will be interrogated.

## 2012-10-24 ENCOUNTER — Ambulatory Visit (HOSPITAL_COMMUNITY)
Admission: RE | Admit: 2012-10-24 | Discharge: 2012-10-24 | Disposition: A | Discharge status: Home / Self Care | Payer: BC Managed Care – PPO | Source: Ambulatory Visit | Attending: Cardiology | Admitting: Cardiology

## 2012-10-24 ENCOUNTER — Encounter: Payer: Self-pay | Admitting: Cardiology

## 2012-10-24 DIAGNOSIS — E119 Type 2 diabetes mellitus without complications: Secondary | ICD-10-CM | POA: Insufficient documentation

## 2012-10-24 DIAGNOSIS — I10 Essential (primary) hypertension: Secondary | ICD-10-CM | POA: Insufficient documentation

## 2012-10-24 DIAGNOSIS — I2510 Atherosclerotic heart disease of native coronary artery without angina pectoris: Secondary | ICD-10-CM | POA: Insufficient documentation

## 2012-10-24 DIAGNOSIS — R0602 Shortness of breath: Secondary | ICD-10-CM

## 2012-10-24 DIAGNOSIS — I509 Heart failure, unspecified: Secondary | ICD-10-CM | POA: Insufficient documentation

## 2012-10-24 DIAGNOSIS — I059 Rheumatic mitral valve disease, unspecified: Secondary | ICD-10-CM

## 2012-10-24 DIAGNOSIS — R0609 Other forms of dyspnea: Secondary | ICD-10-CM | POA: Insufficient documentation

## 2012-10-24 DIAGNOSIS — E785 Hyperlipidemia, unspecified: Secondary | ICD-10-CM | POA: Insufficient documentation

## 2012-10-24 DIAGNOSIS — R0989 Other specified symptoms and signs involving the circulatory and respiratory systems: Secondary | ICD-10-CM | POA: Insufficient documentation

## 2012-10-24 NOTE — Progress Notes (Signed)
*  PRELIMINARY RESULTS* Echocardiogram 2D Echocardiogram has been performed.  Tera Partridge 10/24/2012, 9:57 AM

## 2012-10-29 ENCOUNTER — Telehealth: Payer: Self-pay | Admitting: Cardiology

## 2012-10-29 NOTE — Telephone Encounter (Signed)
Pt states she checked mychart and has questions about her labs

## 2012-10-29 NOTE — Telephone Encounter (Signed)
Discussed results with patient

## 2012-11-04 ENCOUNTER — Ambulatory Visit (INDEPENDENT_AMBULATORY_CARE_PROVIDER_SITE_OTHER): Payer: BC Managed Care – PPO | Admitting: *Deleted

## 2012-11-04 ENCOUNTER — Other Ambulatory Visit: Payer: Self-pay | Admitting: Internal Medicine

## 2012-11-04 DIAGNOSIS — I495 Sick sinus syndrome: Secondary | ICD-10-CM

## 2012-11-04 DIAGNOSIS — I509 Heart failure, unspecified: Secondary | ICD-10-CM

## 2012-11-04 LAB — PACEMAKER DEVICE OBSERVATION
AL IMPEDENCE PM: 531 Ohm
AL THRESHOLD: 0.75 V
RV LEAD AMPLITUDE: 22.4 mv
RV LEAD IMPEDENCE PM: 620 Ohm
RV LEAD THRESHOLD: 0.625 V

## 2012-11-04 NOTE — Progress Notes (Signed)
PPM check

## 2012-11-06 ENCOUNTER — Telehealth: Payer: Self-pay | Admitting: Internal Medicine

## 2012-11-06 NOTE — Telephone Encounter (Signed)
Instructions given to patient for [facility] transmitter.

## 2012-11-06 NOTE — Telephone Encounter (Signed)
Pt got device to hook up at home and forgot to ask how it works the other day when she was in for her appointment. She wants to know if she needs to go ahead and hook it up of should she wait till may.

## 2012-11-20 ENCOUNTER — Encounter: Payer: Self-pay | Admitting: Internal Medicine

## 2012-12-02 ENCOUNTER — Encounter: Payer: Self-pay | Admitting: Internal Medicine

## 2012-12-02 ENCOUNTER — Encounter: Payer: BC Managed Care – PPO | Admitting: Internal Medicine

## 2012-12-02 ENCOUNTER — Telehealth: Payer: Self-pay | Admitting: *Deleted

## 2012-12-02 ENCOUNTER — Ambulatory Visit (INDEPENDENT_AMBULATORY_CARE_PROVIDER_SITE_OTHER): Payer: BC Managed Care – PPO | Admitting: Internal Medicine

## 2012-12-02 VITALS — BP 125/76 | HR 80 | Ht 63.0 in | Wt 125.0 lb

## 2012-12-02 DIAGNOSIS — I495 Sick sinus syndrome: Secondary | ICD-10-CM

## 2012-12-02 DIAGNOSIS — Z950 Presence of cardiac pacemaker: Secondary | ICD-10-CM

## 2012-12-02 DIAGNOSIS — I4892 Unspecified atrial flutter: Secondary | ICD-10-CM

## 2012-12-02 LAB — PACEMAKER DEVICE OBSERVATION
AL IMPEDENCE PM: 543 Ohm
ATRIAL PACING PM: 0
BATTERY VOLTAGE: 2.81 V

## 2012-12-02 NOTE — Telephone Encounter (Signed)
MISTAKE NO CALL BACK NEEDED

## 2012-12-02 NOTE — Patient Instructions (Addendum)
Your physician recommends that you schedule a follow-up appointment in: WITH [facility] IN [place], YOU WILL NOT NEED TO SEE DR Lattie Haw

## 2012-12-04 ENCOUNTER — Encounter: Payer: Self-pay | Admitting: Internal Medicine

## 2012-12-04 NOTE — Assessment & Plan Note (Signed)
Her Medtronic dual-chamber pacemaker is working normally. We'll plan to recheck in several months.

## 2012-12-04 NOTE — Progress Notes (Signed)
HPI [name] [name] returns today for followup. She is a very pleasant middle-age woman with multiple medical problems including aortic valve stenosis and coronary artery disease, status post bypass surgery and aortic valve replacement. The patient developed syncope and was subsequently found to have intermittent complete heart block. She underwent permanent pacemaker insertion. In the interim, she has been stable. No syncope. She does have chronic class II dyspnea. She has mild peripheral edema. She denies chest pain. No fevers or chills. She has rare palpitations.  Allergies  Allergen Reactions  . Clindamycin/Lincomycin Shortness Of Breath and Swelling  . Gentamycin (Gentamicin) Hives and Itching    Reaction noted post loop recorder implant, where gentamycin was used for irrigation.  . Tizanidine     Hallucinate,rash  . Atorvastatin     Muscle and joint pain  . Avandia (Rosiglitazone Maleate)     Congestive heart failure  . Codeine Nausea And Vomiting  . Erythromycin Nausea And Vomiting  . Metformin     Congestive heart failure  . Simvastatin     Increased LFT's  . Cephalexin Swelling and Rash  . Iodinated Diagnostic Agents Other (See Comments)    PAIN DURING LYMPHANGIOGRAM '86, NO ALLERGY TO IV CONTRAST  . Neosporin (Neomycin-Bacitracin Zn-Polymyx) Rash  . Penicillins Swelling and Rash     Current Outpatient Prescriptions  Medication Sig Dispense Refill  . ALPRAZolam (XANAX) 0.25 MG tablet Take 1 tablet (0.25 mg total) by mouth at bedtime.  90 tablet  3  . aspirin EC 81 MG tablet Take 81 mg by mouth daily.      . Benzonatate (TESSALON PERLES PO) Take by mouth every 6 (six) hours as needed.      . Calcium Carbonate-Vitamin D (RA CALCIUM PLUS VITAMIN D) 600-400 MG-UNIT per tablet Take 1 tablet by mouth 2 (two) times daily.       . cholecalciferol (VITAMIN D) 1000 UNITS tablet Take 1,000 Units by mouth daily.        . Coenzyme Q10 (COQ10 PO) Take 400 mg by mouth daily.       Marland Kitchen  desoximetasone (TOPICORT) 0.05 % cream Apply 1 application topically 2 (two) times daily as needed. When skin starts peeling      . ezetimibe (ZETIA) 10 MG tablet Take 1 tablet (10 mg total) by mouth daily.  90 tablet  3  . fenofibrate (TRICOR) 145 MG tablet TAKE ONE (1) TABLET BY MOUTH EVERY DAY  30 tablet  6  . ferrous fumarate (FERRO-SEQUELS) 50 MG CR tablet Take 50 mg by mouth daily.      . furosemide (LASIX) 80 MG tablet Take 80 mg by mouth daily.       . furosemide (LASIX) 80 MG tablet Take 1 tablet (80 mg total) by mouth as needed.  30 tablet  6  . insulin glulisine (APIDRA) 100 UNIT/ML injection Inject into the skin daily. Sliding scale (insulin pump)      . Krill Oil Omega-3 300 MG CAPS Take 1 capsule by mouth daily.      Marland Kitchen levofloxacin (LEVAQUIN) 500 MG tablet Take 500 mg by mouth daily.      . metoprolol tartrate (LOPRESSOR) 25 MG tablet Take 1 tablet (25 mg total) by mouth 2 (two) times daily as needed.  120 tablet  6  . Multiple Vitamins-Minerals (MULTIVITAMIN WITH MINERALS) tablet Take 1 tablet by mouth daily.        Marland Kitchen NITROLINGUAL 0.4 MG/SPRAY spray USE ONE SPRAY UNDER TONGUE EVERY 5 MINUTES AS  NEEDED FOR CHEST PAIN. MAY REPEAT THREE TIMES  4.9 g  3  . omeprazole (PRILOSEC) 20 MG capsule Take 20 mg by mouth daily. Before breakfast as needed      . polyethylene glycol powder (GLYCOLAX/MIRALAX) powder Take 17 g by mouth daily. 1/2 - 1 dose every day.      . potassium chloride SA (K-DUR,KLOR-CON) 20 MEQ tablet Take 40 mEq by mouth daily.      . pravastatin (PRAVACHOL) 10 MG tablet TAKE ONE (1) TABLET BY MOUTH EVERY DAY  90 tablet  3  . sitaGLIPtan (JANUVIA) 100 MG tablet Take 100 mg by mouth daily.        . vitamin C (ASCORBIC ACID) 500 MG tablet Take 500 mg by mouth daily.       No current facility-administered medications for this visit.     Past Medical History  Diagnosis Date  . Aortic stenosis      bicuspid aortic valve; mean gradient of 20 mmHg in 2/10; aortic valve    replacement surgery(19-mm Edwards pericardial valve) with a redo coronary artery bypass graft procedure      in 07/2009; postoperative Dressler's syndrome    . Coronary artery disease     status post RCA saphenous vein graft in 06/1999; multiple preceding PCIs with restenosis; total obstruction of RCA in 2006 with patent graft   . CHF (congestive heart failure)     History of CHF; recurred following AVR/CABG surgery; intermittent PND  . Hyperlipidemia   . Hypertension   . Small bowel obstruction     Recurrent; resolved after resection of a lipoma  . Osteopenia      hip on DEXA in October 2007.  Marland Kitchen Anemia   . Carcinoma, renal cell 05/2007    Laparoscopic right nephrectomy  . NASH (nonalcoholic steatohepatitis) 1999    -biopsy in 1999  . Diabetes mellitus 03/2010     TYPE II: Hemoglobin A1c of 7.4 in  03/2010; 8.4 in 06/2010; treated with insulin pump  . Migraines   . Duodenal ulcer     Remote; H. pylori positive  . Colitis, ischemic 2008  . Gastroesophageal reflux disease     ? of GASTROPARESIS; IRRITABLE BOWEL SYNDROME  . Hodgkin's disease 1991    Mantle radiation therapy  . Exertional dyspnea     chest discomfort  . Statin intolerance   . Hypercalcemia     Ca-11.1 in 07/2011  . Vitamin D deficiency   . Costochondritis   . Insomnia   . Anemia   . Gastroparesis due to DM 05/01/2012  . PONV (postoperative nausea and vomiting)   . Hyperplastic gastric polyp 06/23/2012  . Arthritis   . Heart murmur   . Osteoporosis   . Pacemaker 08/21/2012    Dr Lovena Le    ROS:   All systems reviewed and negative except as noted in the HPI.   Past Surgical History  Procedure Laterality Date  . Total abdominal hysterectomy w/ bilateral salpingoophorectomy  2000  . Breast excisional biopsy      benign, bilateral  . Nephrectomy  2008    Laparoscopic right nephrectomy, renal cell carcinoma  . Coronary artery bypass graft  2005, 2011    CABG-most recent SVG to RCA-12/05; RCA occluded with  patent graft in 2006; Redo bypass in 07/2009  . Tumor excision  1981    removal of Hodgkins lymphoma  . Exploratory laparotomy w/ bowel resection      Small bowel resection of lipoma  .  Aortic valve replacement  2011    Aortic valve replacement surgery with a 19 mm bioprosthetic device post redo CABG January 2011.  . Colonoscopy      Multiple with adenomatous polyps  . Cervical biopsy    . Liver biopsy    . Bone marrow biopsy    . Esophagogastroduodenoscopy    . Esophagogastroduodenoscopy  06/23/2012    Procedure: ESOPHAGOGASTRODUODENOSCOPY (EGD);  Surgeon: Gatha Mayer, MD;  Location: [facility];  Service: Endoscopy;  Laterality: N/A;  . Colonoscopy  06/23/2012    Procedure: COLONOSCOPY;  Surgeon: Gatha Mayer, MD;  Location: [facility];  Service: Endoscopy;  Laterality: N/A;  [name] dilation  06/23/2012    Procedure: [name] DILATION;  Surgeon: Gatha Mayer, MD;  Location: [facility];  Service: Endoscopy;;  56 fr  . Insert / replace / remove pacemaker       Family History  Problem Relation Age of Onset  . Heart attack Mother   . Heart disease Mother   . Heart attack Father   . Diabetes Father   . Heart disease Father   . Hypertension Brother   . Glaucoma Brother   . Lung cancer Sister     meta  . Cancer Sister     breast - half sister (Paternal)  . Ovarian cancer Sister   . Cancer Sister     cervical and lung - half sister (maternal)  . Breast cancer Sister   . Colon cancer Neg Hx   . Stomach cancer Neg Hx      History   Social History  . Marital Status: Married    Spouse Name: N/A    Number of Children: 2  . Years of Education: N/A   Occupational History  . full time     elementary principal   Social History Main Topics  . Smoking status: Never Smoker   . Smokeless tobacco: Never Used  . Alcohol Use: No  . Drug Use: No  . Sexually Active: Not on file   Other Topics Concern  . Not on file   Social History Narrative   School principal,  married   Started disability September 2013     BP 125/76  Pulse 80  Ht 5' 3"  (1.6 m)  Wt 125 lb (56.7 kg)  BMI 22.15 kg/m2  Physical Exam:  Well appearing middle-age woman, NAD HEENT: Unremarkable Neck:  7 cm JVD, no thyromegally Back:  No CVA tenderness Lungs:  Clear with no wheezes, rales, or rhonchi. Well-healed pacemaker incision. HEART:  Regular rate rhythm, no murmurs, no rubs, no clicks Abd:  soft, positive bowel sounds, no organomegally, no rebound, no guarding Ext:  2 plus pulses, no edema, no cyanosis, no clubbing Skin:  No rashes no nodules Neuro:  CN II through XII intact, motor grossly intact  DEVICE  Normal device function.  See PaceArt for details. Note atrial fibrillation lasting several minutes  Assess/Plan:

## 2012-12-04 NOTE — Assessment & Plan Note (Signed)
Since her pacemaker was placed, she has had no recurrent symptomatic bradycardia.

## 2012-12-04 NOTE — Assessment & Plan Note (Signed)
The patient has had several minutes of A. Fib/flutter associated with palpitations. The Clinical importance of these arrhythmias is uncertain. I recommended watchful waiting. She may ultimately require systemic anticoagulation.

## 2012-12-09 ENCOUNTER — Telehealth: Payer: Self-pay | Admitting: Internal Medicine

## 2012-12-09 NOTE — Telephone Encounter (Signed)
Pr notes irregular heartbeat and palpitations, pt notes off and on since last may , does not note every day, pt was advised per GT to call our office if the episodes last longer than an hour, pt notes episode on 12-05-12 for over and hour then again on 12-07-12 for 2 hours and fell asleep with the episode of "jumpy" in her chest but did not have when she woke up the next morning, pt did not have cough or swelling, no SOB either, pt called [facility] office on 12-08-12 and was advised to call our office today per GT will be in our office to advise, advised GT is not in the office at this time but I will send the information to him and give her an answer asap, pt understood.

## 2012-12-09 NOTE — Telephone Encounter (Signed)
Patient is calling to tell us she's had a couple of "episodes" since her last visit, and she thinks she's ready to start Warfarin or Coumadin now.  She would appreciate a call back asap.  You can reach her at:  [phone]

## 2012-12-16 ENCOUNTER — Other Ambulatory Visit: Payer: Self-pay | Admitting: Cardiology

## 2012-12-18 NOTE — Telephone Encounter (Signed)
Patient will need to be seen by me in the next couple of weeks and have her device checked. She may be going into atrial fibrillation. If so she will need anti-arrythmic drug therapy and will need to see me to evaluate this.

## 2012-12-18 NOTE — Telephone Encounter (Signed)
.  Spoke to pt to advise results/instructions. Pt understood. Phone call transferred to [facility] to schedule apt for pt

## 2012-12-18 NOTE — Telephone Encounter (Signed)
.  left message to have patient return my call.  

## 2012-12-22 ENCOUNTER — Ambulatory Visit (INDEPENDENT_AMBULATORY_CARE_PROVIDER_SITE_OTHER): Payer: BC Managed Care – PPO | Admitting: Internal Medicine

## 2012-12-22 ENCOUNTER — Encounter: Payer: Self-pay | Admitting: Internal Medicine

## 2012-12-22 ENCOUNTER — Telehealth: Payer: Self-pay | Admitting: Internal Medicine

## 2012-12-22 VITALS — BP 120/60 | HR 73 | Ht 63.0 in | Wt 124.0 lb

## 2012-12-22 DIAGNOSIS — I495 Sick sinus syndrome: Secondary | ICD-10-CM

## 2012-12-22 DIAGNOSIS — Z950 Presence of cardiac pacemaker: Secondary | ICD-10-CM

## 2012-12-22 DIAGNOSIS — I4891 Unspecified atrial fibrillation: Secondary | ICD-10-CM

## 2012-12-22 LAB — PACEMAKER DEVICE OBSERVATION

## 2012-12-22 NOTE — Patient Instructions (Addendum)
Your physician recommends that you schedule a follow-up appointment in: August with Dr Lovena Le  Your physician recommends that you continue on your current medications as directed. Please refer to the Current Medication list given to you today.

## 2012-12-22 NOTE — Telephone Encounter (Signed)
Pt scheduled for device check today at 10:45.

## 2012-12-22 NOTE — Telephone Encounter (Signed)
New Problem:    Patient called in because she has developed a tender painful lump to the right of her pace maker and after speaking with an on-call physician decided to rest for the rest of the weekend.  Patient stated that the lump is not as painful but she feels like she needs to be seen.  Please call back.

## 2012-12-22 NOTE — Assessment & Plan Note (Signed)
Her medtronic DDD PPM is working normally. Will recheck in several months.

## 2012-12-22 NOTE — Progress Notes (Signed)
HPI [name] [name] returns today for followup. She is a pleasant 62 yo woman with multiple medical problems. She underwent PPM for sinus node dysfunction associated with syncope. She c/o soreness at her PPM insertion site. No syncope. She was noted to have some brief episodes of PAF around the time her episode of bronchitis. Since then she has basically none. She denies palpitations. PPM intergoation is carried out today. Allergies  Allergen Reactions  . Clindamycin/Lincomycin Shortness Of Breath and Swelling  . Gentamycin (Gentamicin) Hives and Itching    Reaction noted post loop recorder implant, where gentamycin was used for irrigation.  . Tizanidine     Hallucinate,rash  . Atorvastatin     Muscle and joint pain  . Avandia (Rosiglitazone Maleate)     Congestive heart failure  . Codeine Nausea And Vomiting  . Erythromycin Nausea And Vomiting  . Metformin     Congestive heart failure  . Simvastatin     Increased LFT's  . Cephalexin Swelling and Rash  . Iodinated Diagnostic Agents Other (See Comments)    PAIN DURING LYMPHANGIOGRAM '86, NO ALLERGY TO IV CONTRAST  . Neosporin (Neomycin-Bacitracin Zn-Polymyx) Rash  . Penicillins Swelling and Rash     Current Outpatient Prescriptions  Medication Sig Dispense Refill  . ALPRAZolam (XANAX) 0.25 MG tablet Take 1 tablet (0.25 mg total) by mouth at bedtime.  90 tablet  3  . aspirin EC 81 MG tablet Take 81 mg by mouth daily.      . Benzonatate (TESSALON PERLES PO) Take by mouth every 6 (six) hours as needed.      . Calcium Carbonate-Vitamin D (RA CALCIUM PLUS VITAMIN D) 600-400 MG-UNIT per tablet Take 1 tablet by mouth 2 (two) times daily.       . cholecalciferol (VITAMIN D) 1000 UNITS tablet Take 1,000 Units by mouth daily.        . Coenzyme Q10 (COQ10 PO) Take 400 mg by mouth daily.       Marland Kitchen desoximetasone (TOPICORT) 0.05 % cream Apply 1 application topically 2 (two) times daily as needed. When skin starts peeling      . ezetimibe (ZETIA) 10  MG tablet Take 1 tablet (10 mg total) by mouth daily.  90 tablet  3  . fenofibrate (TRICOR) 145 MG tablet TAKE ONE (1) TABLET BY MOUTH EVERY DAY  30 tablet  6  . ferrous fumarate (FERRO-SEQUELS) 50 MG CR tablet Take 50 mg by mouth daily.      . furosemide (LASIX) 80 MG tablet Take 1 tablet (80 mg total) by mouth as needed.  30 tablet  6  . furosemide (LASIX) 80 MG tablet TAKE ONE (1) TABLET BY MOUTH EVERY DAY  30 tablet  3  . insulin glulisine (APIDRA) 100 UNIT/ML injection Inject into the skin daily. Sliding scale (insulin pump)      . Krill Oil Omega-3 300 MG CAPS Take 1 capsule by mouth daily.      . metoprolol tartrate (LOPRESSOR) 25 MG tablet Take 1 tablet (25 mg total) by mouth 2 (two) times daily as needed.  120 tablet  6  . Multiple Vitamins-Minerals (MULTIVITAMIN WITH MINERALS) tablet Take 1 tablet by mouth daily.        Marland Kitchen NITROLINGUAL 0.4 MG/SPRAY spray USE ONE SPRAY UNDER TONGUE EVERY 5 MINUTES AS NEEDED FOR CHEST PAIN. MAY REPEAT THREE TIMES  4.9 g  3  . omeprazole (PRILOSEC) 20 MG capsule Take 20 mg by mouth daily. Before breakfast as needed      .  polyethylene glycol powder (GLYCOLAX/MIRALAX) powder Take 17 g by mouth daily. 1/2 - 1 dose every day.      . potassium chloride SA (K-DUR,KLOR-CON) 20 MEQ tablet Take 40 mEq by mouth daily.      . pravastatin (PRAVACHOL) 10 MG tablet TAKE ONE (1) TABLET BY MOUTH EVERY DAY  90 tablet  3  . sitaGLIPtan (JANUVIA) 100 MG tablet Take 100 mg by mouth daily.        . vitamin C (ASCORBIC ACID) 500 MG tablet Take 500 mg by mouth daily.       No current facility-administered medications for this visit.     Past Medical History  Diagnosis Date  . Aortic stenosis      bicuspid aortic valve; mean gradient of 20 mmHg in 2/10; aortic valve   replacement surgery(19-mm Edwards pericardial valve) with a redo coronary artery bypass graft procedure      in 07/2009; postoperative Dressler's syndrome    . Coronary artery disease     status post RCA  saphenous vein graft in 06/1999; multiple preceding PCIs with restenosis; total obstruction of RCA in 2006 with patent graft   . CHF (congestive heart failure)     History of CHF; recurred following AVR/CABG surgery; intermittent PND  . Hyperlipidemia   . Hypertension   . Small bowel obstruction     Recurrent; resolved after resection of a lipoma  . Osteopenia      hip on DEXA in October 2007.  Marland Kitchen Anemia   . Carcinoma, renal cell 05/2007    Laparoscopic right nephrectomy  . NASH (nonalcoholic steatohepatitis) 1999    -biopsy in 1999  . Diabetes mellitus 03/2010     TYPE II: Hemoglobin A1c of 7.4 in  03/2010; 8.4 in 06/2010; treated with insulin pump  . Migraines   . Duodenal ulcer     Remote; H. pylori positive  . Colitis, ischemic 2008  . Gastroesophageal reflux disease     ? of GASTROPARESIS; IRRITABLE BOWEL SYNDROME  . Hodgkin's disease 1991    Mantle radiation therapy  . Exertional dyspnea     chest discomfort  . Statin intolerance   . Hypercalcemia     Ca-11.1 in 07/2011  . Vitamin D deficiency   . Costochondritis   . Insomnia   . Anemia   . Gastroparesis due to DM 05/01/2012  . PONV (postoperative nausea and vomiting)   . Hyperplastic gastric polyp 06/23/2012  . Arthritis   . Heart murmur   . Osteoporosis   . Pacemaker 08/21/2012    Dr Lovena Le    ROS:   All systems reviewed and negative except as noted in the HPI.   Past Surgical History  Procedure Laterality Date  . Total abdominal hysterectomy w/ bilateral salpingoophorectomy  2000  . Breast excisional biopsy      benign, bilateral  . Nephrectomy  2008    Laparoscopic right nephrectomy, renal cell carcinoma  . Coronary artery bypass graft  2005, 2011    CABG-most recent SVG to RCA-12/05; RCA occluded with patent graft in 2006; Redo bypass in 07/2009  . Tumor excision  1981    removal of Hodgkins lymphoma  . Exploratory laparotomy w/ bowel resection      Small bowel resection of lipoma  . Aortic valve  replacement  2011    Aortic valve replacement surgery with a 19 mm bioprosthetic device post redo CABG January 2011.  . Colonoscopy      Multiple with adenomatous polyps  .  Cervical biopsy    . Liver biopsy    . Bone marrow biopsy    . Esophagogastroduodenoscopy    . Esophagogastroduodenoscopy  06/23/2012    Procedure: ESOPHAGOGASTRODUODENOSCOPY (EGD);  Surgeon: Gatha Mayer, MD;  Location: [facility];  Service: Endoscopy;  Laterality: N/A;  . Colonoscopy  06/23/2012    Procedure: COLONOSCOPY;  Surgeon: Gatha Mayer, MD;  Location: [facility];  Service: Endoscopy;  Laterality: N/A;  [name] dilation  06/23/2012    Procedure: [name] DILATION;  Surgeon: Gatha Mayer, MD;  Location: [facility];  Service: Endoscopy;;  54 fr  . Insert / replace / remove pacemaker       Family History  Problem Relation Age of Onset  . Heart attack Mother   . Heart disease Mother   . Heart attack Father   . Diabetes Father   . Heart disease Father   . Hypertension Brother   . Glaucoma Brother   . Lung cancer Sister     meta  . Cancer Sister     breast - half sister (Paternal)  . Ovarian cancer Sister   . Cancer Sister     cervical and lung - half sister (maternal)  . Breast cancer Sister   . Colon cancer Neg Hx   . Stomach cancer Neg Hx      History   Social History  . Marital Status: Married    Spouse Name: N/A    Number of Children: 2  . Years of Education: N/A   Occupational History  . full time     elementary principal   Social History Main Topics  . Smoking status: Never Smoker   . Smokeless tobacco: Never Used  . Alcohol Use: No  . Drug Use: No  . Sexually Active: Not on file   Other Topics Concern  . 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     BP 120/60  Pulse 73  Ht 5' 3"  (1.6 m)  Wt 124 lb (56.246 kg)  BMI 21.97 kg/m2  SpO2 99%  Physical Exam:  Well appearing 62 yo woman,  NAD HEENT: Unremarkable Neck:  7 cm JVD, no thyromegally Lymphatics:  No adenopathy Back:  No CVA tenderness Lungs:  Clear with no wheezes, rales, or rhonchi HEART:  Regular rate rhythm, no murmurs, no rubs, no clicks Abd:  soft, positive bowel sounds, no organomegally, no rebound, no guarding Ext:  2 plus pulses, no edema, no cyanosis, no clubbing Skin:  No rashes no nodules Neuro:  CN II through XII intact, motor grossly intact  DEVICE  Normal device function.  See PaceArt for details.   Assess/Plan:

## 2012-12-22 NOTE — Telephone Encounter (Signed)
Will forward to [place] ([facility] P and An[facility] Purpura B) office, Dr Lovena Le in office there this am.

## 2012-12-23 ENCOUNTER — Encounter: Payer: Self-pay | Admitting: Internal Medicine

## 2012-12-26 ENCOUNTER — Telehealth: Payer: Self-pay | Admitting: *Deleted

## 2012-12-26 NOTE — Telephone Encounter (Signed)
PT IS SEEING DR Lovena Le AND WAS TOLD AT LAST OFFICE VISIT TO CANCEL APPT WITH DR Lattie Haw, SHE DID NOT NEED SEEN THAT CLOSE TOGETHER.   PT IS SWITCHING TO DR MCLEAN FROM DR ROTHBART  HAS APPT WITH DR Lovena Le IN AUG  SHE NEEDS TO KNOW WHEN SHE SHOULD SCHEDULE FOLLOW UP WITH DR [facility]

## 2012-12-30 ENCOUNTER — Ambulatory Visit: Payer: Self-pay | Admitting: Cardiology

## 2012-12-30 NOTE — Telephone Encounter (Signed)
Noted pt seen by GT on 12-22-12, pt advised by Dr Jeri Lager on 10-21-12 to follow up with him in 83month, please advise pt when Dr MAundra Dubinwould like to see this pt per notations below

## 2012-12-31 ENCOUNTER — Telehealth: Payer: Self-pay | Admitting: *Deleted

## 2012-12-31 ENCOUNTER — Telehealth: Payer: Self-pay | Admitting: Oncology

## 2012-12-31 NOTE — Telephone Encounter (Signed)
She can be see in the [place] office.

## 2012-12-31 NOTE — Telephone Encounter (Signed)
Please advise, (pt noted to be transferring to [name] in [place] in August)

## 2012-12-31 NOTE — Telephone Encounter (Signed)
Pt informed by [place], via phone notation

## 2012-12-31 NOTE — Telephone Encounter (Signed)
Please advise when you would like to see this pt based on notations below

## 2012-12-31 NOTE — Telephone Encounter (Signed)
pt wanted to see Dr. Edwyna Shell earlier.Marland KitchenMarland KitchenDone

## 2012-12-31 NOTE — Telephone Encounter (Signed)
I do not know anything about this.

## 2012-12-31 NOTE — Telephone Encounter (Signed)
SHE IS CALLING TO SEE IF WE CAN INCREASE HER XANAX BECAUSE SHE STILL IS NOT SLEEPING.  I ALSO ALREADY TOLD HER ABOUT SCHEDULING APPT IN AUG WITH DR [facility] AT [place] OFFICE (PER LAST PHONE NOTE)

## 2012-12-31 NOTE — Telephone Encounter (Signed)
Since she just saw GT, let's see her in 8/14.

## 2013-01-01 ENCOUNTER — Ambulatory Visit: Payer: BC Managed Care – PPO | Admitting: Internal Medicine

## 2013-01-01 NOTE — Telephone Encounter (Signed)
She can try 0.5 mg QHS prn, but should not get into the habit of using every night or even most nights.

## 2013-01-02 NOTE — Telephone Encounter (Signed)
.  left message for pt to advise new RX called into pharmacy, to call our office with any further assistance if needed. Called into [place] and gave verbal order to [employer] per advice from [facility] RN and Dr Jeri Lager notation, gave pt #30 no refill with directions added to RX not to use every night or even most nights

## 2013-01-05 ENCOUNTER — Other Ambulatory Visit: Payer: Self-pay | Admitting: *Deleted

## 2013-01-05 DIAGNOSIS — R0602 Shortness of breath: Secondary | ICD-10-CM

## 2013-01-05 LAB — COMPREHENSIVE METABOLIC PANEL
ALT: 18 U/L (ref 0–35)
CO2: 29 mEq/L (ref 19–32)
Chloride: 101 mEq/L (ref 96–112)
Potassium: 3.9 mEq/L (ref 3.5–5.3)
Sodium: 140 mEq/L (ref 135–145)
Total Bilirubin: 0.4 mg/dL (ref 0.3–1.2)
Total Protein: 8 g/dL (ref 6.0–8.3)

## 2013-01-06 ENCOUNTER — Encounter: Payer: Self-pay | Admitting: Cardiology

## 2013-01-06 ENCOUNTER — Encounter: Payer: Self-pay | Admitting: *Deleted

## 2013-01-15 ENCOUNTER — Ambulatory Visit: Payer: BC Managed Care – PPO | Admitting: Cardiology

## 2013-01-18 ENCOUNTER — Telehealth: Payer: Self-pay | Admitting: Internal Medicine

## 2013-01-18 NOTE — Telephone Encounter (Signed)
Patient, with whom I am familiar, with multiple medical problems, called late last evening at 11:30 pm. Had abdominal lower discomfort followed by passage of hard stool. Subsequently with self limited nausea and vomiting followed by blood with BM. She has a history of ischemic colitis and felt that this was similar. I recommended supportive care at home with parameters to either call me back or go to ER. I followed up on her a short while ago. Overall feeling better, but fatigued, and still with blood associated with less frequent loose BM's. Taking PO's OK. Likely ischemic colitis versus infectious colitis. I recommended that she be seen in office with extender tomorrow. Knows to call in interim if needed. I will forward to Dr [name] and his nurse for assistance. The patient was asked to call shortly after 8 am.

## 2013-01-19 ENCOUNTER — Ambulatory Visit: Payer: BC Managed Care – PPO | Admitting: Nurse Practitioner

## 2013-01-19 NOTE — Telephone Encounter (Signed)
Patient called back and left a voicemail that she would not be able to come today.  She can't find a ride.  She wants to call back if she feels she needs an appt for another day.  "I think I am getting better, and I certainly feel better than I did yesterday"

## 2013-01-19 NOTE — Telephone Encounter (Signed)
ok 

## 2013-01-19 NOTE — Telephone Encounter (Signed)
Patient still having intermittent abdominal cramping.  She had 4 BM over night with bleeding.  She will come in today at 11:00 and see Tye Savoy RNP

## 2013-01-21 ENCOUNTER — Ambulatory Visit (HOSPITAL_BASED_OUTPATIENT_CLINIC_OR_DEPARTMENT_OTHER): Payer: BC Managed Care – PPO | Admitting: Oncology

## 2013-01-21 ENCOUNTER — Other Ambulatory Visit (HOSPITAL_BASED_OUTPATIENT_CLINIC_OR_DEPARTMENT_OTHER): Payer: BC Managed Care – PPO | Admitting: Lab

## 2013-01-21 ENCOUNTER — Telehealth: Payer: Self-pay | Admitting: Oncology

## 2013-01-21 ENCOUNTER — Encounter: Payer: Self-pay | Admitting: Oncology

## 2013-01-21 VITALS — BP 126/60 | HR 79 | Temp 97.9°F | Resp 20 | Ht 63.0 in | Wt 125.1 lb

## 2013-01-21 DIAGNOSIS — C8119 Nodular sclerosis classical Hodgkin lymphoma, extranodal and solid organ sites: Secondary | ICD-10-CM

## 2013-01-21 DIAGNOSIS — D649 Anemia, unspecified: Secondary | ICD-10-CM

## 2013-01-21 DIAGNOSIS — Z1231 Encounter for screening mammogram for malignant neoplasm of breast: Secondary | ICD-10-CM

## 2013-01-21 DIAGNOSIS — Z85528 Personal history of other malignant neoplasm of kidney: Secondary | ICD-10-CM

## 2013-01-21 LAB — CBC WITH DIFFERENTIAL/PLATELET
BASO%: 1.2 % (ref 0.0–2.0)
LYMPH%: 20.1 % (ref 14.0–49.7)
MCHC: 32.9 g/dL (ref 31.5–36.0)
MONO#: 0.5 10*3/uL (ref 0.1–0.9)
MONO%: 7.7 % (ref 0.0–14.0)
Platelets: 150 10*3/uL (ref 145–400)
RBC: 4.1 10*6/uL (ref 3.70–5.45)
WBC: 6.9 10*3/uL (ref 3.9–10.3)

## 2013-01-21 LAB — COMPREHENSIVE METABOLIC PANEL (CC13)
ALT: 31 U/L (ref 0–55)
AST: 40 U/L — ABNORMAL HIGH (ref 5–34)
Alkaline Phosphatase: 90 U/L (ref 40–150)
CO2: 28 mEq/L (ref 22–29)
Sodium: 142 mEq/L (ref 136–145)
Total Bilirubin: 0.3 mg/dL (ref 0.20–1.20)
Total Protein: 7.9 g/dL (ref 6.4–8.3)

## 2013-01-21 NOTE — Patient Instructions (Signed)
[facility] [address]   [phone]  (Dr Isaiah Blakes)   Monday July 7 @ 1:45, arrive 1:30  Pick up disk of previous mammograms from [place] any time after 2 pm today  Endocrinologist    [name] MD  [facility]

## 2013-01-21 NOTE — Telephone Encounter (Signed)
Printed AVS for pt , will call pt reagrding MD visit , template not ready

## 2013-01-21 NOTE — Progress Notes (Signed)
OFFICE PROGRESS NOTE   01/21/2013   Physicians:W.McGough, M.Altheimer, J.Perry, (L.Borden), (R.Rothbart), G.Midge Aver   INTERVAL HISTORY:  Patient is seen, alone for visit, in yearly follow up of history of IIA nodular sclerosing Hodgkin's lymphoma, which was treated with mantle radiation in 1980s and followed initially by [name]. She has radiation induced hypothyroidism and the treatment caused or worsened coronary artery disease. 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  Additional medical problems include insulin dependent diabetes, aortic valve replacement 2011, CHF, intermittent A fib,  Hx CABG, and ischemic colitis. Last CXR was 10-23-2012. She has pacemaker, no PAC.  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 She had dual chamber pacer placed by Dr Crissie Sickles Jan 2014 after syncopal episodes with intermettent A flutter; she has not had further syncope and so far is not on anticoagulation (see below re rectal bleeding). As Dr Lattie Haw is no longer in same position, she will be seeing Dr [name] for cardiology in addition to Dr Crissie Sickles. She had bronchitis recently, resolved. She had BRBPR last weekend similar to previous ischemic colitis, was in touch with Dr Scarlette Shorts then. Her insurance no longer covers Dr Altheimer and blood sugars are extremely difficult to control, fluctuating from 400 to 60 rapidly despite insulin pump; she may consider university endocrinologist if she cannot get insurance to cover Dr Altheimer, who has followed her for years. She is dyspneic walking up minimal inclines, ok on level ground. She  did not have mammograms in past year due to erosion of previous cardiac loop device thru chest wall; last mammograms were at [facility] 03-12-2011. Present pacer has migrated to a more superficial location without skin problems at present.  Last pertinent imaging from oncology standpoint was 2 view CXR 10-23-2012 with normal mediastinal contours and lungs clear other than minimal right pleural effusion and basilar atelectasis. She had CT abdomen Jan 2013, I believe at [facility], that report not in this imaging section of EMR. Last mammograms were at [facility] 03-12-2011. No new or different pain. No other bleeding. No noted changes on breast self exam. No cough or respiratory symptoms now. Remainder of 10 point Review of Systems negative.  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  Objective:  Vital signs in last 24 hours:  BP 126/60  Pulse 79  Temp(Src) 97.9 F (36.6 C) (Oral)  Resp 20  Ht 5' 3"  (1.6 m)  Wt 125 lb 1.6 oz (56.745 kg)  BMI 22.17 kg/m2 Weight is stable from a year ago. Easily ambulatory in office, respirations not labored, excellent historian as always.   HEENT:PERRLA, extra ocular movement intact, sclera clear, anicteric and oropharynx clear, no lesions no alopecia LymphaticsCervical, supraclavicular, and axillary nodes normal. No inguinal adenopathy Resp: clear to auscultation bilaterally and normal percussion bilaterally Cardio: regular rate and rhythm. Median sternotomy incisions healed. Back nontender GI: soft, non-tender; bowel sounds normal; no masses,  no organomegaly. Insulin pump in place. Extremities: extremities normal, atraumatic, no cyanosis or edema Neuro: nonfocal Breasts bilateraly without dominant mass, skin or nipple findings Pacer in subcutaneous tissues left anterior chest above breast, no skin erosion  or erythema  Lab Results:  Results for orders placed in visit on 01/21/13  CBC WITH  DIFFERENTIAL      Result Value Range   WBC 6.9  3.9 - 10.3 10e3/uL   NEUT# 4.6  1.5 - 6.5 10e3/uL   HGB 11.0 (*) 11.6 - 15.9 g/dL   HCT 33.5 (*) 34.8 - 46.6 %   Platelets 150  145 - 400 10e3/uL   MCV 81.8  79.5 - 101.0 fL   MCH 26.9  25.1 - 34.0 pg   MCHC 32.9  31.5 - 36.0 g/dL   RBC 4.10  3.70 - 5.45 10e6/uL   RDW 15.9 (*) 11.2 - 14.5 %   lymph# 1.4  0.9 - 3.3 10e3/uL   MONO# 0.5  0.1 - 0.9 10e3/uL   Eosinophils Absolute 0.2  0.0 - 0.5 10e3/uL   Basophils Absolute 0.1  0.0 - 0.1 10e3/uL   NEUT% 67.4  38.4 - 76.8 %   LYMPH% 20.1  14.0 - 49.7 %   MONO% 7.7  0.0 - 14.0 %   EOS% 3.6  0.0 - 7.0 %   BASO% 1.2  0.0 - 2.0 %  COMPREHENSIVE METABOLIC PANEL (PY19)      Result Value Range   Sodium 142  136 - 145 mEq/L   Potassium 3.9  3.5 - 5.1 mEq/L   Chloride 106  98 - 109 mEq/L   CO2 28  22 - 29 mEq/L   Glucose 131  70 - 140 mg/dl   BUN 36.9 (*) 7.0 - 26.0 mg/dL   Creatinine 1.4 (*) 0.6 - 1.1 mg/dL   Total Bilirubin 0.30  0.20 - 1.20 mg/dL   Alkaline Phosphatase 90  40 - 150 U/L   AST 40 (*) 5 - 34 U/L   ALT 31  0 - 55 U/L   Total Protein 7.9  6.4 - 8.3 g/dL   Albumin 3.7  3.5 - 5.0 g/dL   Calcium 10.0  8.4 - 10.4 mg/dL    Hgb 11.6 in Jan 2014, likely lower with recent rectal bleeding.  Studies/Results: CHEST - 2 VIEW 10-23-2012 Comparison: 08/22/2012  Findings:  Left subclavian transvenous pacemaker leads project within right  atrium and right ventricle.  Coronary stents noted.  Normal heart size post CABG and AVR.  Atherosclerotic calcification aortic arch.  Mediastinal contours and pulmonary vascularity normal.  Scarring medial right upper lobe.  Minimal right pleural effusion and basilar atelectasis.  Lungs otherwise clear.  No pneumothorax or acute osseous findings.  IMPRESSION:  Post CABG, AVR, pacemaker, and PTCA/stenting.  Minimal right pleural effusion and basilar atelectasis  Last mammograms were at [facility] 02-2011, not done in 2013 due to problems with  cardiac devices. She cannot have MRIs due to pacer.  We have discussed how best to get mammograms accomplished. She would like to try consultation with radiologists at [facility], knew Dr Isaiah Blakes from (?) lymphangiogram for Hodgkins remotely. I have spoken with technologist at [facility] now, who then spoke with radiologist, and they are glad to try to assist. I have requested disks of prior imaging from [place], which patient will pick up, and we have set patient up for imaging at [facility] when Dr Isaiah Blakes is available on 01-26-13 @ 1:45 PM.    Medications: I have reviewed the patient's current medications.  Assessment/Plan:  1.IIA nodular sclerosing Hodgkins lymphoma treated with mantle radiation in 1980s, not active since that treatment. She has treatment related hypothyroidism and CAD which may also be in  part related to the RT. She is at higher risk for breast cancer due to the mantle radiation, overdue mammograms which will be addressed as above. 2.post right nephrectomy for renal cell carcinoma 2008: now on prn follow up by Dr Alinda Money. Last CT imaging was at [facility] Jan 2013. 3.Cardiac disease: CAD post RCA vein graft and redo CABG 2011, aortic stenosis post aortic valve 2011, sinus node dysfunction with brief episodes PAF, dual chamber pacer Jan 2014 4.insulin dependent diabetes (type 1) on insulin pump. Very difficult to control despite extremely compliant patient. Insurance problems following with Dr Altheimer now and she may look into university consultation. 5.hypothyroidism post RT, on replacement by endocrinologist and PCP 6.recent rectal bleeding thought related to previous ischemic colitis, GI aware 7.mild anemia: slightly lower hgb likely due to recent rectal bleeding. 8.mild renal insufficiency: single kidney, diabetes   She still may want to follow with medical oncology in a year, or may prefer to be on prn follow up at this office. She has had questions answered to her  satisfaction and expresses appreciation for care. She understands that I will be in touch with Dr Isaiah Blakes by this note in anticipation of breast imaging at that office 01-26-13.    LIVESAY,LENNIS P, MD   01/21/2013, 12:19 PM

## 2013-01-26 ENCOUNTER — Telehealth: Payer: Self-pay | Admitting: Oncology

## 2013-01-26 ENCOUNTER — Telehealth: Payer: Self-pay

## 2013-01-26 NOTE — Telephone Encounter (Signed)
Faxed office note from Dr. Mariana Kaufman visit 01-21-13 to Dr. Isaiah Blakes as requested by Dr. Marko Plume.

## 2013-01-26 NOTE — Telephone Encounter (Signed)
lmonvm for pt re appt for 01/27/2014. Also confirmed mammo @ [facility] 01/30/13 @ 10am. S/w Elia @ [facility] to confirm appt. Schedule mailed.

## 2013-01-30 ENCOUNTER — Ambulatory Visit (INDEPENDENT_AMBULATORY_CARE_PROVIDER_SITE_OTHER): Payer: BC Managed Care – PPO | Admitting: *Deleted

## 2013-01-30 ENCOUNTER — Encounter: Payer: Self-pay | Admitting: Internal Medicine

## 2013-01-30 ENCOUNTER — Telehealth: Payer: Self-pay | Admitting: Internal Medicine

## 2013-01-30 DIAGNOSIS — I495 Sick sinus syndrome: Secondary | ICD-10-CM

## 2013-01-30 DIAGNOSIS — Z950 Presence of cardiac pacemaker: Secondary | ICD-10-CM

## 2013-01-30 LAB — PACEMAKER DEVICE OBSERVATION
AL IMPEDENCE PM: 526 Ohm
ATRIAL PACING PM: 0
BATTERY VOLTAGE: 2.81 V
RV LEAD IMPEDENCE PM: 649 Ohm
VENTRICULAR PACING PM: 0

## 2013-01-30 NOTE — Telephone Encounter (Signed)
Pt walk-in check today at [facility] clinic c/o vibratory sensations at gen site.  Her device model does not have vibratory alerts. Full interrogation performed, no problems found, no changes made, full details in PaceArt. Pt questioning if migration of device to a more superficial/surface position from original implant is causing sensations.   ROV w/ Dr. Raj Janus (non-device appt) 03/16/13 @ 9:00am Madaline Savage

## 2013-01-30 NOTE — Telephone Encounter (Signed)
LMOM for pt to return call to [facility] office or [employer]

## 2013-01-30 NOTE — Progress Notes (Signed)
Pt walk-in check today at [facility] clinic c/o vibratory sensations at gen site.  Her device model does not have vibratory alerts. Pt stating sensation occurred for 3 hours last night (7-10pm). Full interrogation performed, no changes made, full details in paceart. Pt questioning if migration of device to a more superficial/surface position from original implant is causing sensations. ROV w/ Dr. Raj Janus (non-device appt) 03/16/13 @ 9:00am Madaline Savage

## 2013-01-30 NOTE — Telephone Encounter (Signed)
New Problem  Pt states that she can feel her pacemaker vibrating. She said she is not in any pain but she has never felt this before.

## 2013-02-02 ENCOUNTER — Telehealth: Payer: Self-pay | Admitting: Internal Medicine

## 2013-02-02 NOTE — Telephone Encounter (Signed)
Error / tgs  °

## 2013-02-04 ENCOUNTER — Ambulatory Visit (HOSPITAL_COMMUNITY)
Admission: RE | Admit: 2013-02-04 | Discharge: 2013-02-04 | Disposition: A | Discharge status: Home / Self Care | Payer: BC Managed Care – PPO | Source: Ambulatory Visit | Attending: Cardiology | Admitting: Cardiology

## 2013-02-04 ENCOUNTER — Other Ambulatory Visit: Payer: BC Managed Care – PPO | Admitting: Lab

## 2013-02-04 ENCOUNTER — Ambulatory Visit (INDEPENDENT_AMBULATORY_CARE_PROVIDER_SITE_OTHER): Payer: BC Managed Care – PPO | Admitting: Cardiology

## 2013-02-04 ENCOUNTER — Encounter: Payer: Self-pay | Admitting: Cardiology

## 2013-02-04 ENCOUNTER — Ambulatory Visit: Payer: BC Managed Care – PPO | Admitting: Cardiology

## 2013-02-04 ENCOUNTER — Ambulatory Visit: Payer: BC Managed Care – PPO | Admitting: Oncology

## 2013-02-04 VITALS — BP 124/58 | HR 78 | Ht 63.0 in | Wt 124.0 lb

## 2013-02-04 DIAGNOSIS — Z954 Presence of other heart-valve replacement: Secondary | ICD-10-CM | POA: Insufficient documentation

## 2013-02-04 DIAGNOSIS — I509 Heart failure, unspecified: Secondary | ICD-10-CM

## 2013-02-04 DIAGNOSIS — I495 Sick sinus syndrome: Secondary | ICD-10-CM

## 2013-02-04 DIAGNOSIS — I5021 Acute systolic (congestive) heart failure: Secondary | ICD-10-CM

## 2013-02-04 DIAGNOSIS — K559 Vascular disorder of intestine, unspecified: Secondary | ICD-10-CM

## 2013-02-04 DIAGNOSIS — Z950 Presence of cardiac pacemaker: Secondary | ICD-10-CM | POA: Insufficient documentation

## 2013-02-04 MED ORDER — SALMETEROL XINAFOATE 50 MCG/DOSE IN AEPB: 2.0000 | INHALATION_SPRAY | Freq: Two times a day (BID) | RESPIRATORY_TRACT | Status: DC

## 2013-02-04 NOTE — Progress Notes (Deleted)
Name: [name] [name]    DOB: Oct 22, 1950  Age: 62 y.o.  MR#: [id number]       PCP:  Leonides Grills, MD      Insurance: Payor: [place] / Plan: [place] PPO / Product Type: *No Product type* /   CC:    Chief Complaint  Patient presents with  . Appointment    fatigue. chest vibration with discomfort and soreness. shob. not usual symptoms. feels this mainly when not doing activities and goes every 5-10 seconds for 59mns up to hrs at a time.  no list  VS Filed Vitals:   02/04/13 1325  BP: 124/58  Pulse: 78  Height: 5' 3"  (1.6 m)  Weight: 124 lb (56.246 kg)    Weights Current Weight  02/04/13 124 lb (56.246 kg)  01/21/13 125 lb 1.6 oz (56.745 kg)  12/22/12 124 lb (56.246 kg)    Blood Pressure  BP Readings from Last 3 Encounters:  02/04/13 124/58  01/21/13 126/60  12/22/12 120/60     Admit date:  (Not on file) Last encounter with RMR:  01/15/2013   Allergy Clindamycin/lincomycin; Gentamycin; Tizanidine; Atorvastatin; Avandia; Codeine; Erythromycin; Metformin; Simvastatin; Cephalexin; Iodinated diagnostic agents; Neosporin; and Penicillins  Current Outpatient Prescriptions  Medication Sig Dispense Refill  . ALPRAZolam (XANAX) 0.25 MG tablet Take 1 tablet (0.25 mg total) by mouth at bedtime.  90 tablet  3  . aspirin EC 81 MG tablet Take 81 mg by mouth daily.      . Benzonatate (TESSALON PERLES PO) Take by mouth every 6 (six) hours as needed.      . Calcium Carbonate-Vitamin D (RA CALCIUM PLUS VITAMIN D) 600-400 MG-UNIT per tablet Take 1 tablet by mouth 2 (two) times daily.       . cholecalciferol (VITAMIN D) 1000 UNITS tablet Take 1,000 Units by mouth daily.        . Coenzyme Q10 (COQ10 PO) Take 400 mg by mouth daily.       .Marland Kitchendesoximetasone (TOPICORT) 0.05 % cream Apply 1 application topically 2 (two) times daily as needed. When skin starts peeling      . ezetimibe (ZETIA) 10 MG tablet Take 1 tablet (10 mg total) by mouth daily.  90 tablet  3  .  fenofibrate (TRICOR) 145 MG tablet TAKE ONE (1) TABLET BY MOUTH EVERY DAY  30 tablet  6  . ferrous fumarate (FERRO-SEQUELS) 50 MG CR tablet Take 50 mg by mouth daily.      . furosemide (LASIX) 80 MG tablet TAKE ONE (1) TABLET BY MOUTH EVERY DAY  30 tablet  3  . insulin glulisine (APIDRA) 100 UNIT/ML injection Inject into the skin daily. Sliding scale (insulin pump)      . Krill Oil Omega-3 300 MG CAPS Take 1 capsule by mouth daily.      . metoprolol tartrate (LOPRESSOR) 25 MG tablet Take 1 tablet (25 mg total) by mouth 2 (two) times daily as needed.  120 tablet  6  . Multiple Vitamins-Minerals (MULTIVITAMIN WITH MINERALS) tablet Take 1 tablet by mouth daily.        .Marland KitchenNITROLINGUAL 0.4 MG/SPRAY spray USE ONE SPRAY UNDER TONGUE EVERY 5 MINUTES AS NEEDED FOR CHEST PAIN. MAY REPEAT THREE TIMES  4.9 g  3  . omeprazole (PRILOSEC) 20 MG capsule Take 20 mg by mouth daily. Before breakfast as needed      . polyethylene glycol powder (GLYCOLAX/MIRALAX) powder Take 17 g by mouth daily. 1/2 - 1  dose every day.      . potassium chloride SA (K-DUR,KLOR-CON) 20 MEQ tablet Take 40 mEq by mouth daily.      . pravastatin (PRAVACHOL) 10 MG tablet TAKE ONE (1) TABLET BY MOUTH EVERY DAY  90 tablet  3  . sitaGLIPtan (JANUVIA) 100 MG tablet Take 100 mg by mouth daily.        . vitamin C (ASCORBIC ACID) 500 MG tablet Take 500 mg by mouth daily.       No current facility-administered medications for this visit.    Discontinued Meds:    Medications Discontinued During This Encounter  Medication Reason  . furosemide (LASIX) 80 MG tablet Completed Course    Patient Active Problem List   Diagnosis Date Noted  . Atrial fibrillation 12/22/2012  . Lipoma of neck 09/23/2012  . Palpitations 09/08/2012  . Pacemaker 09/08/2012  . Chronic kidney disease, stage 3/cardiorenal syndrome 08/15/2012  . Atrial flutter 07/23/2012  . Sick sinus syndrome 07/21/2012  . Hyperplastic gastric polyp 06/23/2012  . Gastroparesis due to  DM 05/01/2012  . Hypercalcemia   . Cholelithiasis 07/20/2011  . Aortic stenosis status post 19 mm Edwards pericardial valve replacement 2011   . Arteriosclerotic cardiovascular disease (ASCVD)   . Osteopenia   . Anemia, normocytic normochromic   . NASH (nonalcoholic steatohepatitis)   . Renal cell carcinoma     Class: History of  . Duodenal ulcer     Class: History of  . Ischemic colitis     Class: History of  . Gastroesophageal reflux disease   . Hodgkin's disease   . Hypertension   . Small bowel obstruction   . CHF (congestive heart failure)   . Hyperlipidemia 04/11/2010  . Diabetes mellitus type 2, controlled 03/23/2010  . Colonic polyps, adenomatous 09/29/2009    LABS    Component Value Date/Time   NA 142 01/21/2013 0908   NA 140 01/05/2013 1010   NA 143 10/21/2012 1500   NA 143 08/14/2012 0900   K 3.9 01/21/2013 0908   K 3.9 01/05/2013 1010   K 3.9 10/21/2012 1500   K 3.9 08/14/2012 0900   CL 101 01/05/2013 1010   CL 101 10/21/2012 1500   CL 106 08/14/2012 0900   CO2 28 01/21/2013 0908   CO2 29 01/05/2013 1010   CO2 28 10/21/2012 1500   CO2 29 08/14/2012 0900   GLUCOSE 131 01/21/2013 0908   GLUCOSE 183* 01/05/2013 1010   GLUCOSE 72 10/21/2012 1500   GLUCOSE 98 08/14/2012 0900   BUN 36.9* 01/21/2013 0908   BUN 37* 01/05/2013 1010   BUN 41* 10/21/2012 1500   BUN 30* 08/14/2012 0900   CREATININE 1.4* 01/21/2013 0908   CREATININE 1.18* 01/05/2013 1010   CREATININE 1.44* 10/21/2012 1500   CREATININE 1.0 08/14/2012 0900   CREATININE 1.20* 02/06/2012 1118   CREATININE 1.01 11/30/2011 2340   CREATININE 1.20* 09/19/2011 1603   CALCIUM 10.0 01/21/2013 0908   CALCIUM 10.3 01/05/2013 1010   CALCIUM 9.7 10/21/2012 1500   CALCIUM 10.1 08/14/2012 0900   GFRNONAA 60* 11/30/2011 2340   GFRNONAA 51* 08/10/2010 0504   GFRNONAA 50* 08/09/2010 0546   GFRAA 70* 11/30/2011 2340   GFRAA  Value: >60        The eGFR has been calculated using the MDRD equation. This calculation has not been validated in all clinical  situations. eGFR's persistently <60 mL/min signify possible Chronic Kidney Disease. 08/10/2010 0504   GFRAA  Value: >60  The eGFR has been calculated using the MDRD equation. This calculation has not been validated in all clinical situations. eGFR's persistently <60 mL/min signify possible Chronic Kidney Disease. 08/09/2010 0546   CMP     Component Value Date/Time   NA 142 01/21/2013 0908   NA 140 01/05/2013 1010   K 3.9 01/21/2013 0908   K 3.9 01/05/2013 1010   CL 101 01/05/2013 1010   CO2 28 01/21/2013 0908   CO2 29 01/05/2013 1010   GLUCOSE 131 01/21/2013 0908   GLUCOSE 183* 01/05/2013 1010   BUN 36.9* 01/21/2013 0908   BUN 37* 01/05/2013 1010   CREATININE 1.4* 01/21/2013 0908   CREATININE 1.18* 01/05/2013 1010   CREATININE 1.0 08/14/2012 0900   CALCIUM 10.0 01/21/2013 0908   CALCIUM 10.3 01/05/2013 1010   PROT 7.9 01/21/2013 0908   PROT 8.0 01/05/2013 1010   ALBUMIN 3.7 01/21/2013 0908   ALBUMIN 4.8 01/05/2013 1010   AST 40* 01/21/2013 0908   AST 25 01/05/2013 1010   ALT 31 01/21/2013 0908   ALT 18 01/05/2013 1010   ALKPHOS 90 01/21/2013 0908   ALKPHOS 76 01/05/2013 1010   BILITOT 0.30 01/21/2013 0908   BILITOT 0.4 01/05/2013 1010   GFRNONAA 60* 11/30/2011 2340   GFRAA 70* 11/30/2011 2340       Component Value Date/Time   WBC 6.9 01/21/2013 0908   WBC 9.8 10/21/2012 1500   WBC 7.9 08/14/2012 0900   WBC 8.8 02/06/2012 1118   WBC 10.4 11/30/2011 2340   WBC 8.6 01/04/2011 1514   HGB 11.0* 01/21/2013 0908   HGB 10.6* 10/21/2012 1500   HGB 11.6* 08/14/2012 0900   HGB 12.5 02/06/2012 1118   HGB 11.8* 11/30/2011 2340   HGB 12.9 01/04/2011 1514   HCT 33.5* 01/21/2013 0908   HCT 33.9* 10/21/2012 1500   HCT 34.9* 08/14/2012 0900   HCT 38.1 02/06/2012 1118   HCT 36.1 11/30/2011 2340   HCT 38.5 01/04/2011 1514   MCV 81.8 01/21/2013 0908   MCV 86.9 10/21/2012 1500   MCV 86.4 08/14/2012 0900   MCV 86.1 02/06/2012 1118   MCV 84.5 11/30/2011 2340   MCV 88.0 01/04/2011 1514    Lipid Panel     Component Value Date/Time   CHOL 234*  04/18/2011 1515   TRIG 152* 04/18/2011 1515   HDL 50 04/18/2011 1515   CHOLHDL 4.7 04/18/2011 1515   VLDL 30 04/18/2011 1515   LDLCALC 154* 04/18/2011 1515    ABG    Component Value Date/Time   PHART 7.305* 07/27/2009 2318   PCO2ART 49.5* 07/27/2009 2318   PO2ART 87.0 07/27/2009 2318   HCO3 24.2* 07/27/2009 2318   TCO2 26 09/01/2009 2004   ACIDBASEDEF 2.0 07/27/2009 2318   O2SAT 95.0 07/27/2009 2318     Lab Results  Component Value Date   TSH 2.711 07/30/2011   BNP (last 3 results) No results found for this basename: PROBNP,  in the last 8760 hours Cardiac Panel (last 3 results) No results found for this basename: CKTOTAL, CKMB, TROPONINI, RELINDX,  in the last 72 hours  Iron/TIBC/Ferritin    Component Value Date/Time   IRON 116 01/04/2011 1514   TIBC 401 01/04/2011 1514   FERRITIN 172 01/04/2011 1514     EKG Orders placed in visit on 12/22/12  . EKG 12-LEAD     Prior Assessment and Plan Problem List as of 02/04/2013   Hodgkin's disease   Hyperlipidemia   Last Assessment & Plan   10/21/2012 Office  Visit Written 10/22/2012  9:01 AM by Yehuda Savannah, MD     Adequate control of hyperlipidemia, particularly when coronary artery disease likely to be primarily the result of radiation damage; however, some degree of cerebrovascular disease is present as well.    Colonic polyps, adenomatous   Hypertension   Last Assessment & Plan   10/21/2012 Office Visit Written 10/22/2012  9:00 AM by Yehuda Savannah, MD     Adequate control of hypertension. Current medication will be continued.    Small bowel obstruction   CHF (congestive heart failure)   Last Assessment & Plan   10/21/2012 Office Visit Edited 10/26/2012 11:22 AM by Yehuda Savannah, MD     Recurrent CHF, likely multifactorial. Current contributors include diastolic dysfunction, patient valve mismatch following AVR, anemia, chronic kidney disease, etc.  Patient advised to be more proactive about increasing furosemide whenever weight gain  noted. We will reassess status of CHF control with chest x-ray, and basic laboratory studies including a BNP level.    Aortic stenosis status post 19 mm Edwards pericardial valve replacement 2011   Last Assessment & Plan   10/21/2012 Office Visit Written 10/22/2012  9:03 AM by Yehuda Savannah, MD     There is likely a component of patient-valve mismatch as well as the murmur of aortic insufficiency. Cardiac status will be reassessed with echocardiography.    Arteriosclerotic cardiovascular disease (ASCVD)   Last Assessment & Plan   10/21/2012 Office Visit Edited 10/26/2012 11:22 AM by Yehuda Savannah, MD     Chest tightness is probably related directly to pulmonary edema. I doubt recurrent myocardial ischemia, and no provocative testing is necessary at present.    Osteopenia   Last Assessment & Plan   09/19/2011 Office Visit Written 09/19/2011  4:41 PM by Yehuda Savannah, MD     Osteopenia could be related to borderline hyperparathyroidism.  Bone densitometry was last performed 2 years ago.  A repeat study would be of interest.    Anemia, normocytic normochromic   Last Assessment & Plan   07/21/2012 Office Visit Written 07/21/2012 12:35 PM by Yehuda Savannah, MD     Normal CBC in 01/2012.  Anemia resolved.    NASH (nonalcoholic steatohepatitis)   Last Assessment & Plan   09/19/2011 Office Visit Written 09/19/2011  4:40 PM by Yehuda Savannah, MD     LFTs have been minimally abnormal on recent determinations, but have returned to normal as of testing in 08/2011.    Renal cell carcinoma   Diabetes mellitus type 2, controlled   Last Assessment & Plan   03/06/2012 Office Visit Written 03/09/2012 10:40 PM by Yehuda Savannah, MD     Insulin requirement has been much higher recently than in the past, but diabetic control remains suboptimal.  Endocrinology is working very effectively and closely with Ms. Cadenas to optimize treatment.    Duodenal ulcer   Ischemic colitis   Gastroesophageal  reflux disease   Cholelithiasis   Hypercalcemia   Last Assessment & Plan   09/19/2011 Office Visit Edited 09/23/2011  8:01 AM by Yehuda Savannah, MD     Calcium level has decreased from a peak of 11.1 to 10.6.  Dr. Eden Emms has reviewed laboratory studies and advised patient that there is inadequate evidence for true hyperparathyroidism.  She does appear to have at least a borderline abnormality with a low normal PTH level.  Borderline hypercalcemia was likely exacerbated by administration of vitamin D and calcium  plus a degree of acute renal failure associated with ACE inhibitor.    Gastroparesis due to DM   Hyperplastic gastric polyp   Sick sinus syndrome   Last Assessment & Plan   12/02/2012 Office Visit Written 12/04/2012 12:49 PM by Evans Lance, MD     Since her pacemaker was placed, she has had no recurrent symptomatic bradycardia.    Atrial flutter   Last Assessment & Plan   12/02/2012 Office Visit Written 12/04/2012 12:49 PM by Evans Lance, MD     The patient has had several minutes of A. Fib/flutter associated with palpitations. The Clinical importance of these arrhythmias is uncertain. I recommended watchful waiting. She may ultimately require systemic anticoagulation.    Chronic kidney disease, stage 3/cardiorenal syndrome   Last Assessment & Plan   10/21/2012 Office Visit Written 10/22/2012  8:58 AM by Yehuda Savannah, MD     Stable renal function with stage III impairment.    Palpitations   Last Assessment & Plan   09/08/2012 Office Visit Written 09/08/2012 10:41 AM by Evans Lance, MD     Her palpitations are a new problem. Review of her pacemaker histograms demonstrates frequent PVCs. PVCs are almost certainly the etiology of her palpitations. I've encouraged the patient to take an additional beta blocker as needed, up to 100 mg of metoprolol daily. If she continues to have symptoms of palpitations despite increasing her beta blocker dose, we could consider antiarrhythmic  drug therapy.    Pacemaker   Last Assessment & Plan   12/22/2012 Office Visit Written 12/22/2012 12:20 PM by Evans Lance, MD     Her medtronic DDD PPM is working normally. Will recheck in several months.    Lipoma of neck   Atrial fibrillation       Imaging: No results found.

## 2013-02-04 NOTE — Patient Instructions (Addendum)
Your physician recommends that you schedule a follow-up appointment in: [place]  A chest x-ray takes a picture of the organs and structures inside the chest, including the heart, lungs, and blood vessels. This test can show several things, including, whether the heart is enlarges; whether fluid is building up in the lungs; and whether pacemaker / defibrillator leads are still in place.  Your physician has recommended you make the following change in your medication:   1) START SEREVENT INHALER (TAKE 2 PUFFS TWICE DAILY)

## 2013-02-17 ENCOUNTER — Ambulatory Visit: Payer: BC Managed Care – PPO | Admitting: Adult Health

## 2013-02-18 ENCOUNTER — Encounter: Payer: Self-pay | Admitting: Cardiology

## 2013-02-18 NOTE — Progress Notes (Signed)
Patient ID: [name] [name], female   DOB: [birth year], 62 y.o.   MRN: [id number]  HPI: Scheduled return visit for continued assessment of complex cardiac disease which includes single-vessel coronary disease, valvular heart disease and diastolic dysfunction.  Since her last visit 3 months ago, she has experienced no symptoms suggestive of decompensation of congestive heart failure. She describes a new problem of an usual sensation in her chest that has been occurring intermittently over the past few weeks. She characterizes this as a vibration, but notes no association with either phase of respiration.  She denies fever, cough or sputum production.  Current Outpatient Prescriptions  Medication Sig Dispense Refill  . ALPRAZolam (XANAX) 0.25 MG tablet Take 1 tablet (0.25 mg total) by mouth at bedtime.  90 tablet  3  . aspirin EC 81 MG tablet Take 81 mg by mouth daily.      . Benzonatate (TESSALON PERLES PO) Take by mouth every 6 (six) hours as needed.      . Calcium Carbonate-Vitamin D (RA CALCIUM PLUS VITAMIN D) 600-400 MG-UNIT per tablet Take 1 tablet by mouth 2 (two) times daily.       . cholecalciferol (VITAMIN D) 1000 UNITS tablet Take 1,000 Units by mouth daily.        . Coenzyme Q10 (COQ10 PO) Take 400 mg by mouth daily.       Marland Kitchen desoximetasone (TOPICORT) 0.05 % cream Apply 1 application topically 2 (two) times daily as needed. When skin starts peeling      . ezetimibe (ZETIA) 10 MG tablet Take 1 tablet (10 mg total) by mouth daily.  90 tablet  3  . fenofibrate (TRICOR) 145 MG tablet TAKE ONE (1) TABLET BY MOUTH EVERY DAY  30 tablet  6  . ferrous fumarate (FERRO-SEQUELS) 50 MG CR tablet Take 50 mg by mouth daily.      . furosemide (LASIX) 80 MG tablet TAKE ONE (1) TABLET BY MOUTH EVERY DAY  30 tablet  3  . insulin glulisine (APIDRA) 100 UNIT/ML injection Inject into the skin daily. Sliding scale (insulin pump)      . Krill Oil Omega-3 300 MG CAPS Take 1 capsule by mouth daily.      .  metoprolol tartrate (LOPRESSOR) 25 MG tablet Take 1 tablet (25 mg total) by mouth 2 (two) times daily as needed.  120 tablet  6  . Multiple Vitamins-Minerals (MULTIVITAMIN WITH MINERALS) tablet Take 1 tablet by mouth daily.        Marland Kitchen NITROLINGUAL 0.4 MG/SPRAY spray USE ONE SPRAY UNDER TONGUE EVERY 5 MINUTES AS NEEDED FOR CHEST PAIN. MAY REPEAT THREE TIMES  4.9 g  3  . omeprazole (PRILOSEC) 20 MG capsule Take 20 mg by mouth daily. Before breakfast as needed      . polyethylene glycol powder (GLYCOLAX/MIRALAX) powder Take 17 g by mouth daily. 1/2 - 1 dose every day.      . potassium chloride SA (K-DUR,KLOR-CON) 20 MEQ tablet Take 40 mEq by mouth daily.      . pravastatin (PRAVACHOL) 10 MG tablet TAKE ONE (1) TABLET BY MOUTH EVERY DAY  90 tablet  3  . sitaGLIPtan (JANUVIA) 100 MG tablet Take 100 mg by mouth daily.        . vitamin C (ASCORBIC ACID) 500 MG tablet Take 500 mg by mouth daily.      . salmeterol (SEREVENT DISKUS) 50 MCG/DOSE diskus inhaler Inhale 2 puffs into the lungs 2 (two) times daily.  1 Inhaler  12   No current facility-administered medications for this visit.   Allergies  Allergen Reactions  . Clindamycin/Lincomycin Shortness Of Breath and Swelling  . Gentamycin (Gentamicin) Hives and Itching    Reaction noted post loop recorder implant, where gentamycin was used for irrigation.  . Tizanidine     Hallucinate,rash  . Atorvastatin     Muscle and joint pain  . Avandia (Rosiglitazone Maleate)     Congestive heart failure  . Codeine Nausea And Vomiting  . Erythromycin Nausea And Vomiting  . Metformin     Congestive heart failure  . Simvastatin     Increased LFT's  . Cephalexin Swelling and Rash  . Iodinated Diagnostic Agents Other (See Comments)    PAIN DURING LYMPHANGIOGRAM '86, NO ALLERGY TO IV CONTRAST  . Neosporin (Neomycin-Bacitracin Zn-Polymyx) Rash  . Penicillins Swelling and Rash     Past medical history, social history, and family history reviewed and  updated.  ROS: Denies orthopnea, PND, pedal edema, lightheadedness or syncope. Recent episode of hematochezia diagnosis ischemic colitis and treated conservatively as an outpatient with resolution. All other systems reviewed and are negative.  PHYSICAL EXAM: BP 124/58  Pulse 78  Ht 5' 3"  (1.6 m)  Wt 56.246 kg (124 lb)  BMI 21.97 kg/m2;  Body mass index is 21.97 kg/(m^2). General-Well developed; no acute distress Body habitus-proportionate weight and height Neck-No JVD; no carotid bruits Lungs-clear lung fields; resonant to percussion Cardiovascular-normal PMI; normal S1; increased intensity of S2; grade 2/6 systolic ejection murmur radiating to the carotids Abdomen-normal bowel sounds; soft and non-tender without masses or organomegaly Musculoskeletal-No deformities, no cyanosis or clubbing Neurologic-Normal cranial nerves; symmetric strength and tone Skin-Warm, no significant lesions Extremities-distal pulses intact; no edema  [name], MD 02/18/2013  12:40 AM  ASSESSMENT AND PLAN

## 2013-02-18 NOTE — Assessment & Plan Note (Addendum)
[name] [name] has had clear episodes of chest heart failure with preserved left ventricular systolic function. Her current symptoms are unlike these previous episodes, which have responded to treatment with diuretic. They sound most like bronchial obstruction, and do not elicit a high degree of concern. A chest x-ray will be obtained and treatment with bronchodilator initiated.

## 2013-02-18 NOTE — Assessment & Plan Note (Signed)
Symptoms of sick sinus syndrome have been generally well controlled following pacemaker implantation in 07/2012.

## 2013-02-23 ENCOUNTER — Ambulatory Visit: Payer: BC Managed Care – PPO | Admitting: Adult Health

## 2013-02-23 ENCOUNTER — Other Ambulatory Visit: Payer: Self-pay | Admitting: Cardiology

## 2013-02-24 ENCOUNTER — Ambulatory Visit (INDEPENDENT_AMBULATORY_CARE_PROVIDER_SITE_OTHER): Payer: BC Managed Care – PPO | Admitting: Adult Health

## 2013-02-24 ENCOUNTER — Encounter: Payer: Self-pay | Admitting: Adult Health

## 2013-02-24 VITALS — BP 139/55 | HR 71 | Ht 62.0 in | Wt 126.0 lb

## 2013-02-24 DIAGNOSIS — I10 Essential (primary) hypertension: Secondary | ICD-10-CM

## 2013-02-24 DIAGNOSIS — I509 Heart failure, unspecified: Secondary | ICD-10-CM

## 2013-02-24 DIAGNOSIS — I359 Nonrheumatic aortic valve disorder, unspecified: Secondary | ICD-10-CM

## 2013-02-24 DIAGNOSIS — I495 Sick sinus syndrome: Secondary | ICD-10-CM

## 2013-02-24 DIAGNOSIS — I350 Nonrheumatic aortic (valve) stenosis: Secondary | ICD-10-CM

## 2013-02-24 DIAGNOSIS — I5030 Unspecified diastolic (congestive) heart failure: Secondary | ICD-10-CM

## 2013-02-24 NOTE — Progress Notes (Signed)
HPI: [name] [name] is a 62 year old patient of Dr. Lattie Haw we are following for on going assessment and management of complex cardiac disease which includes single-vessel CAD, valvular heart disease, diastolic dysfunction, sick sinus syndrome status post pacemaker. She was last seen in the office on 02/18/2013 at which time she complained of an unusual sensation in her chest that was occurring intermittantly over the last few weeks. Vibration feeling beneath her left breast. Chest x-ray was obtained with bronchodilator therapy initiated. Chest x-ray revealed no active disease. She was concerned about use of bronchodilator causing increased heart rate and has stopped using it Pace maker was interrogated and found to be functioning appropriately.    She has brought with her a journal of the events this occurs. Most often in the morning, occuring everyday. Lasting 15 minutes to 3-4 hours. Had a separate occurrence under a stressful situation, with tachycardia, flushing and dizziness. Chest tightness. " I feel like I am going to explode!" Passes when she rests for 35 minutes.   Otherwise she is doing well. Followed by Endocrinologist and Oncologist at [facility] now. She is to have dental surgery and needs to have antibiotic prophylaxis. Is allergic to PCN and Mycins.  Allergies  Allergen Reactions  . Clindamycin/Lincomycin Shortness Of Breath and Swelling  . Gentamycin (Gentamicin) Hives and Itching    Reaction noted post loop recorder implant, where gentamycin was used for irrigation.  . Tizanidine     Hallucinate,rash  . Atorvastatin     Muscle and joint pain  . Avandia (Rosiglitazone Maleate)     Congestive heart failure  . Codeine Nausea And Vomiting  . Erythromycin Nausea And Vomiting  . Metformin     Congestive heart failure  . Simvastatin     Increased LFT's  . Cephalexin Swelling and Rash  . Iodinated Diagnostic Agents Other (See Comments)    PAIN DURING LYMPHANGIOGRAM '86, NO ALLERGY TO  IV CONTRAST  . Neosporin (Neomycin-Bacitracin Zn-Polymyx) Rash  . Penicillins Swelling and Rash    Current Outpatient Prescriptions  Medication Sig Dispense Refill  . ALPRAZolam (XANAX) 0.25 MG tablet Take 1 tablet (0.25 mg total) by mouth at bedtime.  90 tablet  3  . aspirin EC 81 MG tablet Take 81 mg by mouth daily.      . Benzonatate (TESSALON PERLES PO) Take by mouth every 6 (six) hours as needed.      . Calcium Carbonate-Vitamin D (RA CALCIUM PLUS VITAMIN D) 600-400 MG-UNIT per tablet Take 1 tablet by mouth 2 (two) times daily.       . cholecalciferol (VITAMIN D) 1000 UNITS tablet Take 1,000 Units by mouth daily.        . Coenzyme Q10 (COQ10 PO) Take 400 mg by mouth daily.       Marland Kitchen desoximetasone (TOPICORT) 0.05 % cream Apply 1 application topically 2 (two) times daily as needed. When skin starts peeling      . ezetimibe (ZETIA) 10 MG tablet Take 1 tablet (10 mg total) by mouth daily.  90 tablet  3  . fenofibrate (TRICOR) 145 MG tablet TAKE ONE (1) TABLET EACH DAY  30 tablet  2  . ferrous fumarate (FERRO-SEQUELS) 50 MG CR tablet Take 50 mg by mouth daily.      . furosemide (LASIX) 80 MG tablet TAKE ONE (1) TABLET BY MOUTH EVERY DAY  30 tablet  3  . insulin glulisine (APIDRA) 100 UNIT/ML injection Inject into the skin daily. Sliding scale (insulin pump)      .  Krill Oil Omega-3 300 MG CAPS Take 1 capsule by mouth daily.      . metoprolol tartrate (LOPRESSOR) 25 MG tablet Take 1 tablet (25 mg total) by mouth 2 (two) times daily as needed.  120 tablet  6  . Multiple Vitamins-Minerals (MULTIVITAMIN WITH MINERALS) tablet Take 1 tablet by mouth daily.        Marland Kitchen NITROLINGUAL 0.4 MG/SPRAY spray USE ONE SPRAY UNDER TONGUE EVERY 5 MINUTES AS NEEDED FOR CHEST PAIN. MAY REPEAT THREE TIMES  4.9 g  3  . omeprazole (PRILOSEC) 20 MG capsule Take 20 mg by mouth daily. Before breakfast as needed      . polyethylene glycol powder (GLYCOLAX/MIRALAX) powder Take 17 g by mouth daily. 1/2 - 1 dose every day.       . potassium chloride SA (K-DUR,KLOR-CON) 20 MEQ tablet Take 40 mEq by mouth daily.      . pravastatin (PRAVACHOL) 10 MG tablet TAKE ONE (1) TABLET BY MOUTH EVERY DAY  90 tablet  3  . salmeterol (SEREVENT DISKUS) 50 MCG/DOSE diskus inhaler Inhale 2 puffs into the lungs 2 (two) times daily.  1 Inhaler  12  . sitaGLIPtan (JANUVIA) 100 MG tablet Take 100 mg by mouth daily.        . vitamin C (ASCORBIC ACID) 500 MG tablet Take 500 mg by mouth daily.       No current facility-administered medications for this visit.    Past Medical History  Diagnosis Date  . Aortic stenosis      bicuspid aortic valve; mean gradient of 20 mmHg in 2/10; aortic valve   replacement surgery(19-mm Edwards pericardial valve) with a redo coronary artery bypass graft procedure      in 07/2009; postoperative Dressler's syndrome    . Coronary artery disease     status post RCA saphenous vein graft in 06/1999; multiple preceding PCIs with restenosis; total obstruction of RCA in 2006 with patent graft   . CHF (congestive heart failure)     History of CHF; recurred following AVR/CABG surgery; intermittent PND  . Hyperlipidemia   . Hypertension   . Small bowel obstruction     Recurrent; resolved after resection of a lipoma  . Osteopenia      hip on DEXA in October 2007.  Marland Kitchen Anemia   . Carcinoma, renal cell 05/2007    Laparoscopic right nephrectomy  . NASH (nonalcoholic steatohepatitis) 1999    -biopsy in 1999  . Diabetes mellitus 03/2010     TYPE II: Hemoglobin A1c of 7.4 in  03/2010; 8.4 in 06/2010; treated with insulin pump  . Migraines   . Duodenal ulcer     Remote; H. pylori positive  . Colitis, ischemic 2008  . Gastroesophageal reflux disease     ? of GASTROPARESIS; IRRITABLE BOWEL SYNDROME  . Hodgkin's disease 1991    Mantle radiation therapy  . Exertional dyspnea     chest discomfort  . Statin intolerance   . Hypercalcemia     Ca-11.1 in 07/2011  . Vitamin D deficiency   . Costochondritis   . Insomnia     . Anemia   . Gastroparesis due to DM 05/01/2012  . PONV (postoperative nausea and vomiting)   . Hyperplastic gastric polyp 06/23/2012  . Arthritis   . Heart murmur   . Osteoporosis   . Pacemaker 08/21/2012    Medtronic Adapta L dual-chamber pacemaker-Dr. Lovena Le    Past Surgical History  Procedure Laterality Date  . Total abdominal hysterectomy w/  bilateral salpingoophorectomy  2000  . Breast excisional biopsy      benign, bilateral  . Nephrectomy  2008    Laparoscopic right nephrectomy, renal cell carcinoma  . Coronary artery bypass graft  2005, 2011    CABG-most recent SVG to RCA-12/05; RCA occluded with patent graft in 2006; Redo bypass in 07/2009  . Tumor excision  1981    removal of Hodgkins lymphoma  . Exploratory laparotomy w/ bowel resection      Small bowel resection of lipoma  . Aortic valve replacement  2011    Aortic valve replacement surgery with a 19 mm bioprosthetic device post redo CABG January 2011.  . Colonoscopy      Multiple with adenomatous polyps  . Cervical biopsy    . Liver biopsy    . Bone marrow biopsy    . Esophagogastroduodenoscopy    . Esophagogastroduodenoscopy  06/23/2012    Procedure: ESOPHAGOGASTRODUODENOSCOPY (EGD);  Surgeon: Gatha Mayer, MD;  Location: [facility];  Service: Endoscopy;  Laterality: N/A;  . Colonoscopy  06/23/2012    Procedure: COLONOSCOPY;  Surgeon: Gatha Mayer, MD;  Location: [facility];  Service: Endoscopy;  Laterality: N/A;  [name] dilation  06/23/2012    Procedure: [name] DILATION;  Surgeon: Gatha Mayer, MD;  Location: [facility];  Service: Endoscopy;;  54 fr  . Pacemaker insertion  08/21/2012    Medtronic Adapta L dual-chamber pacemaker    HXT:AVWPVX of systems complete and found to be negative unless listed above  PHYSICAL EXAM BP 139/55  Pulse 71  Ht 5' 2"  (1.575 m)  Wt 126 lb (57.153 kg)  BMI 23.04 kg/m2 General: Well developed, well nourished, in no acute distress Head: Eyes PERRLA, No  xanthomas.   Normal cephalic and atramatic  Lungs: Clear bilaterally to auscultation and percussion.Pacemaker in upper left chest. Heart: HRRR S1 S2, without MRG.  Pulses are 2+ & equal.            No carotid bruit. No JVD.   Abdomen: Bowel sounds are positive, abdomen soft and non-tender without masses or                  Hernia's noted. Msk:  Back normal, normal gait. Normal strength and tone for age. Extremities: No clubbing, cyanosis or edema.  DP +1 Neuro: Alert and oriented X 3. Psych:  Good affect, anxious, responds appropriately  :  ASSESSMENT AND PLAN

## 2013-02-24 NOTE — Progress Notes (Deleted)
Name: [name] [name]    DOB: 19-Oct-1950  Age: 62 y.o.  MR#: [id number]       PCP:  Leonides Grills, MD      Insurance: Payor: [employer] / Plan: [place] PPO / Product Type: *No Product type* /   CC:    Chief Complaint  Patient presents with  . Congestive Heart Failure    Systolic  . Bradycardia    S/P Pacemaker    VS Filed Vitals:   02/24/13 1135  BP: 139/55  Pulse: 71  Height: 5' 2"  (1.575 m)  Weight: 126 lb (57.153 kg)    Weights Current Weight  02/24/13 126 lb (57.153 kg)  02/04/13 124 lb (56.246 kg)  01/21/13 125 lb 1.6 oz (56.745 kg)    Blood Pressure  BP Readings from Last 3 Encounters:  02/24/13 139/55  02/04/13 124/58  01/21/13 126/60     Admit date:  (Not on file) Last encounter with RMR:  02/23/2013   Allergy Clindamycin/lincomycin; Gentamycin; Tizanidine; Atorvastatin; Avandia; Codeine; Erythromycin; Metformin; Simvastatin; Cephalexin; Iodinated diagnostic agents; Neosporin; and Penicillins  Current Outpatient Prescriptions  Medication Sig Dispense Refill  . ALPRAZolam (XANAX) 0.25 MG tablet Take 1 tablet (0.25 mg total) by mouth at bedtime.  90 tablet  3  . aspirin EC 81 MG tablet Take 81 mg by mouth daily.      . Benzonatate (TESSALON PERLES PO) Take by mouth every 6 (six) hours as needed.      . Calcium Carbonate-Vitamin D (RA CALCIUM PLUS VITAMIN D) 600-400 MG-UNIT per tablet Take 1 tablet by mouth 2 (two) times daily.       . cholecalciferol (VITAMIN D) 1000 UNITS tablet Take 1,000 Units by mouth daily.        . Coenzyme Q10 (COQ10 PO) Take 400 mg by mouth daily.       Marland Kitchen desoximetasone (TOPICORT) 0.05 % cream Apply 1 application topically 2 (two) times daily as needed. When skin starts peeling      . ezetimibe (ZETIA) 10 MG tablet Take 1 tablet (10 mg total) by mouth daily.  90 tablet  3  . fenofibrate (TRICOR) 145 MG tablet TAKE ONE (1) TABLET EACH DAY  30 tablet  2  . ferrous fumarate (FERRO-SEQUELS) 50 MG CR tablet Take 50  mg by mouth daily.      . furosemide (LASIX) 80 MG tablet TAKE ONE (1) TABLET BY MOUTH EVERY DAY  30 tablet  3  . insulin glulisine (APIDRA) 100 UNIT/ML injection Inject into the skin daily. Sliding scale (insulin pump)      . Krill Oil Omega-3 300 MG CAPS Take 1 capsule by mouth daily.      . metoprolol tartrate (LOPRESSOR) 25 MG tablet Take 1 tablet (25 mg total) by mouth 2 (two) times daily as needed.  120 tablet  6  . Multiple Vitamins-Minerals (MULTIVITAMIN WITH MINERALS) tablet Take 1 tablet by mouth daily.        Marland Kitchen NITROLINGUAL 0.4 MG/SPRAY spray USE ONE SPRAY UNDER TONGUE EVERY 5 MINUTES AS NEEDED FOR CHEST PAIN. MAY REPEAT THREE TIMES  4.9 g  3  . omeprazole (PRILOSEC) 20 MG capsule Take 20 mg by mouth daily. Before breakfast as needed      . polyethylene glycol powder (GLYCOLAX/MIRALAX) powder Take 17 g by mouth daily. 1/2 - 1 dose every day.      . potassium chloride SA (K-DUR,KLOR-CON) 20 MEQ tablet Take 40 mEq by mouth daily.      Marland Kitchen  pravastatin (PRAVACHOL) 10 MG tablet TAKE ONE (1) TABLET BY MOUTH EVERY DAY  90 tablet  3  . salmeterol (SEREVENT DISKUS) 50 MCG/DOSE diskus inhaler Inhale 2 puffs into the lungs 2 (two) times daily.  1 Inhaler  12  . sitaGLIPtan (JANUVIA) 100 MG tablet Take 100 mg by mouth daily.        . vitamin C (ASCORBIC ACID) 500 MG tablet Take 500 mg by mouth daily.       No current facility-administered medications for this visit.    Discontinued Meds:   There are no discontinued medications.  Patient Active Problem List   Diagnosis Date Noted  . Atrial fibrillation 12/22/2012  . Lipoma of neck 09/23/2012  . Palpitations 09/08/2012  . Pacemaker 09/08/2012  . Chronic kidney disease, stage 3/cardiorenal syndrome 08/15/2012  . Atrial flutter 07/23/2012  . Sick sinus syndrome 07/21/2012  . Hyperplastic gastric polyp 06/23/2012  . Gastroparesis due to DM 05/01/2012  . Hypercalcemia   . Cholelithiasis 07/20/2011  . Aortic stenosis status post 19 mm Edwards  pericardial valve replacement 2011   . Arteriosclerotic cardiovascular disease (ASCVD)   . Osteopenia   . Anemia, normocytic normochromic   . NASH (nonalcoholic steatohepatitis)   . Renal cell carcinoma     Class: History of  . Duodenal ulcer     Class: History of  . Ischemic colitis     Class: History of  . Gastroesophageal reflux disease   . Hodgkin's disease   . Hypertension   . Small bowel obstruction   . Diastolic congestive heart failure with preserved left ventricular function, NYHA class 2   . Hyperlipidemia 04/11/2010  . Diabetes mellitus type 2, controlled 03/23/2010  . Colonic polyps, adenomatous 09/29/2009    LABS    Component Value Date/Time   NA 142 01/21/2013 0908   NA 140 01/05/2013 1010   NA 143 10/21/2012 1500   NA 143 08/14/2012 0900   K 3.9 01/21/2013 0908   K 3.9 01/05/2013 1010   K 3.9 10/21/2012 1500   K 3.9 08/14/2012 0900   CL 101 01/05/2013 1010   CL 101 10/21/2012 1500   CL 106 08/14/2012 0900   CO2 28 01/21/2013 0908   CO2 29 01/05/2013 1010   CO2 28 10/21/2012 1500   CO2 29 08/14/2012 0900   GLUCOSE 131 01/21/2013 0908   GLUCOSE 183* 01/05/2013 1010   GLUCOSE 72 10/21/2012 1500   GLUCOSE 98 08/14/2012 0900   BUN 36.9* 01/21/2013 0908   BUN 37* 01/05/2013 1010   BUN 41* 10/21/2012 1500   BUN 30* 08/14/2012 0900   CREATININE 1.4* 01/21/2013 0908   CREATININE 1.18* 01/05/2013 1010   CREATININE 1.44* 10/21/2012 1500   CREATININE 1.0 08/14/2012 0900   CREATININE 1.20* 02/06/2012 1118   CREATININE 1.01 11/30/2011 2340   CREATININE 1.20* 09/19/2011 1603   CALCIUM 10.0 01/21/2013 0908   CALCIUM 10.3 01/05/2013 1010   CALCIUM 9.7 10/21/2012 1500   CALCIUM 10.1 08/14/2012 0900   GFRNONAA 60* 11/30/2011 2340   GFRNONAA 51* 08/10/2010 0504   GFRNONAA 50* 08/09/2010 0546   GFRAA 70* 11/30/2011 2340   GFRAA  Value: >60        The eGFR has been calculated using the MDRD equation. This calculation has not been validated in all clinical situations. eGFR's persistently <60 mL/min signify possible  Chronic Kidney Disease. 08/10/2010 0504   GFRAA  Value: >60        The eGFR has been calculated using the MDRD  equation. This calculation has not been validated in all clinical situations. eGFR's persistently <60 mL/min signify possible Chronic Kidney Disease. 08/09/2010 0546   CMP     Component Value Date/Time   NA 142 01/21/2013 0908   NA 140 01/05/2013 1010   K 3.9 01/21/2013 0908   K 3.9 01/05/2013 1010   CL 101 01/05/2013 1010   CO2 28 01/21/2013 0908   CO2 29 01/05/2013 1010   GLUCOSE 131 01/21/2013 0908   GLUCOSE 183* 01/05/2013 1010   BUN 36.9* 01/21/2013 0908   BUN 37* 01/05/2013 1010   CREATININE 1.4* 01/21/2013 0908   CREATININE 1.18* 01/05/2013 1010   CREATININE 1.0 08/14/2012 0900   CALCIUM 10.0 01/21/2013 0908   CALCIUM 10.3 01/05/2013 1010   PROT 7.9 01/21/2013 0908   PROT 8.0 01/05/2013 1010   ALBUMIN 3.7 01/21/2013 0908   ALBUMIN 4.8 01/05/2013 1010   AST 40* 01/21/2013 0908   AST 25 01/05/2013 1010   ALT 31 01/21/2013 0908   ALT 18 01/05/2013 1010   ALKPHOS 90 01/21/2013 0908   ALKPHOS 76 01/05/2013 1010   BILITOT 0.30 01/21/2013 0908   BILITOT 0.4 01/05/2013 1010   GFRNONAA 60* 11/30/2011 2340   GFRAA 70* 11/30/2011 2340       Component Value Date/Time   WBC 6.9 01/21/2013 0908   WBC 9.8 10/21/2012 1500   WBC 7.9 08/14/2012 0900   WBC 8.8 02/06/2012 1118   WBC 10.4 11/30/2011 2340   WBC 8.6 01/04/2011 1514   HGB 11.0* 01/21/2013 0908   HGB 10.6* 10/21/2012 1500   HGB 11.6* 08/14/2012 0900   HGB 12.5 02/06/2012 1118   HGB 11.8* 11/30/2011 2340   HGB 12.9 01/04/2011 1514   HCT 33.5* 01/21/2013 0908   HCT 33.9* 10/21/2012 1500   HCT 34.9* 08/14/2012 0900   HCT 38.1 02/06/2012 1118   HCT 36.1 11/30/2011 2340   HCT 38.5 01/04/2011 1514   MCV 81.8 01/21/2013 0908   MCV 86.9 10/21/2012 1500   MCV 86.4 08/14/2012 0900   MCV 86.1 02/06/2012 1118   MCV 84.5 11/30/2011 2340   MCV 88.0 01/04/2011 1514    Lipid Panel     Component Value Date/Time   CHOL 234* 04/18/2011 1515   TRIG 152* 04/18/2011 1515   HDL 50  04/18/2011 1515   CHOLHDL 4.7 04/18/2011 1515   VLDL 30 04/18/2011 1515   LDLCALC 154* 04/18/2011 1515    ABG    Component Value Date/Time   PHART 7.305* 07/27/2009 2318   PCO2ART 49.5* 07/27/2009 2318   PO2ART 87.0 07/27/2009 2318   HCO3 24.2* 07/27/2009 2318   TCO2 26 09/01/2009 2004   ACIDBASEDEF 2.0 07/27/2009 2318   O2SAT 95.0 07/27/2009 2318     Lab Results  Component Value Date   TSH 2.711 07/30/2011   BNP (last 3 results) No results found for this basename: PROBNP,  in the last 8760 hours Cardiac Panel (last 3 results) No results found for this basename: CKTOTAL, CKMB, TROPONINI, RELINDX,  in the last 72 hours  Iron/TIBC/Ferritin    Component Value Date/Time   IRON 116 01/04/2011 1514   TIBC 401 01/04/2011 1514   FERRITIN 172 01/04/2011 1514     EKG Orders placed in visit on 12/22/12  . EKG 12-LEAD     Prior Assessment and Plan Problem List as of 02/24/2013     Cardiovascular and Mediastinum   Hypertension   Last Assessment & Plan   10/21/2012 Office Visit Written 10/22/2012  9:00  AM by Yehuda Savannah, MD     Adequate control of hypertension. Current medication will be continued.    Diastolic congestive heart failure with preserved left ventricular function, NYHA class 2   Last Assessment & Plan   02/04/2013 Office Visit Edited 02/18/2013  1:09 PM by Yehuda Savannah, MD     Ms. Ditto has had clear episodes of chest heart failure with preserved left ventricular systolic function. Her current symptoms are unlike these previous episodes, which have responded to treatment with diuretic. They sound most like bronchial obstruction, and do not elicit a high degree of concern. A chest x-ray will be obtained and treatment with bronchodilator initiated.    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 Cardiac status will be reassessed with echocardiography.    Arteriosclerotic cardiovascular disease (ASCVD)   Last Assessment & Plan   10/21/2012 Office Visit Edited 10/26/2012 11:22 AM by Yehuda Savannah, MD     Chest tightness is probably related directly to pulmonary edema. I doubt recurrent myocardial ischemia, and no provocative testing is necessary at present.    Sick sinus syndrome   Last Assessment & Plan   02/04/2013 Office Visit Written 02/18/2013 12:52 AM by Yehuda Savannah, MD     Symptoms of sick sinus syndrome have been generally well controlled following pacemaker implantation in 07/2012.    Atrial flutter   Last Assessment & Plan   12/02/2012 Office Visit Written 12/04/2012 12:49 PM by Evans Lance, MD     The patient has had several minutes of A. Fib/flutter associated with palpitations. The Clinical importance of these arrhythmias is uncertain. I recommended watchful waiting. She may ultimately require systemic anticoagulation.    Atrial fibrillation     Digestive   Small bowel obstruction   NASH (nonalcoholic steatohepatitis)   Last Assessment & Plan   09/19/2011 Office Visit Written 09/19/2011  4:40 PM by Yehuda Savannah, MD     LFTs have been minimally abnormal on recent determinations, but have returned to normal as of testing in 08/2011.    Duodenal ulcer   Ischemic colitis   Gastroesophageal reflux disease   Cholelithiasis   Gastroparesis due to DM   Hyperplastic gastric polyp     Endocrine   Diabetes mellitus type 2, controlled   Last Assessment & Plan   03/06/2012 Office Visit Written 03/09/2012 10:40 PM by Yehuda Savannah, MD     Insulin requirement has been much higher recently than in the past, but diabetic control remains suboptimal.  Endocrinology is working very effectively and closely with Ms. Mccrone to optimize treatment.      Musculoskeletal and Integument   Osteopenia   Last Assessment  & Plan   09/19/2011 Office Visit Written 09/19/2011  4:41 PM by Yehuda Savannah, MD     Osteopenia could be related to borderline hyperparathyroidism.  Bone densitometry was last performed 2 years ago.  A repeat study would be of interest.    Lipoma of neck     Genitourinary   Renal cell carcinoma   Chronic kidney disease, stage 3/cardiorenal syndrome   Last Assessment & Plan   10/21/2012 Office Visit Written 10/22/2012  8:58 AM by Yehuda Savannah, MD  Stable renal function with stage III impairment.      Other   Hodgkin's disease   Hyperlipidemia   Last Assessment & Plan   10/21/2012 Office Visit Written 10/22/2012  9:01 AM by Yehuda Savannah, MD     Adequate control of hyperlipidemia, particularly when coronary artery disease likely to be primarily the result of radiation damage; however, some degree of cerebrovascular disease is present as well.    Colonic polyps, adenomatous   Anemia, normocytic normochromic   Last Assessment & Plan   07/21/2012 Office Visit Written 07/21/2012 12:35 PM by Yehuda Savannah, MD     Normal CBC in 01/2012.  Anemia resolved.    Hypercalcemia   Last Assessment & Plan   09/19/2011 Office Visit Edited 09/23/2011  8:01 AM by Yehuda Savannah, MD     Calcium level has decreased from a peak of 11.1 to 10.6.  Dr. Eden Emms has reviewed laboratory studies and advised patient that there is inadequate evidence for true hyperparathyroidism.  She does appear to have at least a borderline abnormality with a low normal PTH level.  Borderline hypercalcemia was likely exacerbated by administration of vitamin D and calcium plus a degree of acute renal failure associated with ACE inhibitor.    Palpitations   Last Assessment & Plan   09/08/2012 Office Visit Written 09/08/2012 10:41 AM by Evans Lance, MD     Her palpitations are a new problem. Review of her pacemaker histograms demonstrates frequent PVCs. PVCs are almost certainly the etiology of her palpitations. I've  encouraged the patient to take an additional beta blocker as needed, up to 100 mg of metoprolol daily. If she continues to have symptoms of palpitations despite increasing her beta blocker dose, we could consider antiarrhythmic drug therapy.    Pacemaker   Last Assessment & Plan   12/22/2012 Office Visit Written 12/22/2012 12:20 PM by Evans Lance, MD     Her medtronic DDD PPM is working normally. Will recheck in several months.        Imaging: Dg Chest 2 View  02/04/2013   *RADIOLOGY REPORT*  Clinical Data: Congestive heart failure  CHEST - 2 VIEW  Comparison: 4/1/ 14  Findings: Cardiomediastinal silhouette is stable.  The patient is status post median sternotomy and aortic valve replacement.  Dual lead cardiac pacemaker is unchanged in position.  No acute infiltrate or pleural effusion.  No pulmonary edema.  Mild elevation of the right hemidiaphragm again noted.  IMPRESSION: No active disease.  No significant change.   Original Report Authenticated By: Lahoma Crocker, M.D.

## 2013-02-24 NOTE — Patient Instructions (Signed)
Your physician recommends that you schedule a follow-up appointment in: [place] physician recommends that you continue on your current medications as directed. Please refer to the Current Medication list given to you today.

## 2013-02-24 NOTE — Assessment & Plan Note (Signed)
Pacemaker was interrogated on July 11th and found to be functioning appropriately. She seem to be very sensitive to her device and acutely aware of any sensations occuring her her body.  I have reassured her that the vibrations, although aggravating, are not overly concerning as they are only lasting seconds and do not cause any other symptoms. She verbalizes understanding.

## 2013-02-24 NOTE — Assessment & Plan Note (Signed)
No evidence of decompensation at this time. She is stable. No changes in medications.

## 2013-02-24 NOTE — Assessment & Plan Note (Signed)
I have checked with Sharia Reeve D concerning appropriate antibiotic prophylaxis. She will send me a staff message when she has recommendation.

## 2013-02-24 NOTE — Assessment & Plan Note (Signed)
Well controlled on current medications. Will not make any other changes at this time.

## 2013-02-26 ENCOUNTER — Telehealth: Payer: Self-pay | Admitting: *Deleted

## 2013-02-26 NOTE — Telephone Encounter (Signed)
This message is to inform you that the patient has not yet read the following message. (Notification date: February 26, 2013)       From Lendon Colonel, NP [[phone]]   To [name]   Composed 02/24/2013 12:53 PM   For Delivery On 02/24/2013 12:53 PM   Subject Medications prior to dental procedure.   Message Type User Message   Read Status N   By [name] has not read the message   Message Body Our pharmacist states that clarithromycin is the best choice, will low likelihood of allergic reaction even though you are allergic to clindamycin. She recommends this for you, to discuss with your dentist.    Thanks for letting us know,    Jory Sims NP  [place]        Left message for patient to check my chart email.

## 2013-03-12 DIAGNOSIS — K7460 Unspecified cirrhosis of liver: Secondary | ICD-10-CM | POA: Insufficient documentation

## 2013-03-12 DIAGNOSIS — K7469 Other cirrhosis of liver: Secondary | ICD-10-CM | POA: Insufficient documentation

## 2013-03-12 DIAGNOSIS — N183 Chronic kidney disease, stage 3 (moderate): Secondary | ICD-10-CM

## 2013-03-12 DIAGNOSIS — E010 Iodine-deficiency related diffuse (endemic) goiter: Secondary | ICD-10-CM | POA: Insufficient documentation

## 2013-03-12 DIAGNOSIS — I509 Heart failure, unspecified: Secondary | ICD-10-CM | POA: Insufficient documentation

## 2013-03-12 DIAGNOSIS — Z950 Presence of cardiac pacemaker: Secondary | ICD-10-CM | POA: Insufficient documentation

## 2013-03-12 DIAGNOSIS — E785 Hyperlipidemia, unspecified: Secondary | ICD-10-CM | POA: Insufficient documentation

## 2013-03-12 DIAGNOSIS — Z4681 Encounter for fitting and adjustment of insulin pump: Secondary | ICD-10-CM | POA: Insufficient documentation

## 2013-03-16 ENCOUNTER — Encounter: Payer: Self-pay | Admitting: *Deleted

## 2013-03-16 ENCOUNTER — Ambulatory Visit (INDEPENDENT_AMBULATORY_CARE_PROVIDER_SITE_OTHER): Payer: BC Managed Care – PPO | Admitting: Internal Medicine

## 2013-03-16 ENCOUNTER — Encounter: Payer: Self-pay | Admitting: Internal Medicine

## 2013-03-16 VITALS — BP 108/65 | HR 81 | Ht 62.0 in | Wt 127.0 lb

## 2013-03-16 DIAGNOSIS — I7090 Unspecified atherosclerosis: Secondary | ICD-10-CM

## 2013-03-16 DIAGNOSIS — D1739 Benign lipomatous neoplasm of skin and subcutaneous tissue of other sites: Secondary | ICD-10-CM

## 2013-03-16 DIAGNOSIS — D170 Benign lipomatous neoplasm of skin and subcutaneous tissue of head, face and neck: Secondary | ICD-10-CM

## 2013-03-16 DIAGNOSIS — I4892 Unspecified atrial flutter: Secondary | ICD-10-CM

## 2013-03-16 DIAGNOSIS — I5030 Unspecified diastolic (congestive) heart failure: Secondary | ICD-10-CM

## 2013-03-16 DIAGNOSIS — I509 Heart failure, unspecified: Secondary | ICD-10-CM

## 2013-03-16 DIAGNOSIS — I4891 Unspecified atrial fibrillation: Secondary | ICD-10-CM

## 2013-03-16 DIAGNOSIS — I10 Essential (primary) hypertension: Secondary | ICD-10-CM

## 2013-03-16 DIAGNOSIS — I495 Sick sinus syndrome: Secondary | ICD-10-CM

## 2013-03-16 DIAGNOSIS — I2510 Atherosclerotic heart disease of native coronary artery without angina pectoris: Secondary | ICD-10-CM

## 2013-03-16 DIAGNOSIS — I350 Nonrheumatic aortic (valve) stenosis: Secondary | ICD-10-CM

## 2013-03-16 DIAGNOSIS — R002 Palpitations: Secondary | ICD-10-CM

## 2013-03-16 DIAGNOSIS — I359 Nonrheumatic aortic valve disorder, unspecified: Secondary | ICD-10-CM

## 2013-03-16 LAB — PACEMAKER DEVICE OBSERVATION
BAMS-0001: 150 {beats}/min
BATTERY VOLTAGE: 2.81 V
RV LEAD AMPLITUDE: 22.4 mv
VENTRICULAR PACING PM: 0

## 2013-03-16 MED ORDER — METOPROLOL TARTRATE 50 MG PO TABS: 50.0000 mg | ORAL_TABLET | Freq: Two times a day (BID) | ORAL | Status: DC | PRN

## 2013-03-16 MED ORDER — FLUTICASONE PROPIONATE 50 MCG/ACT NA SUSP: 2.0000 | Freq: Every day | NASAL | Status: DC

## 2013-03-16 MED ORDER — FUROSEMIDE 80 MG PO TABS: 40.0000 mg | ORAL_TABLET | Freq: Every day | ORAL | Status: DC

## 2013-03-16 NOTE — Patient Instructions (Addendum)
Your physician recommends that you schedule a follow-up appointment in: In January with Dr Lovena Le  Your physician has recommended you make the following change in your medication:  1. Decrease Furosemide to 40 mg daily 2. Increase metoprolol to 50 mg twice a day.   Your physician recommends you have a cardiopulmonary stress test.

## 2013-03-17 ENCOUNTER — Encounter: Payer: Self-pay | Admitting: Internal Medicine

## 2013-03-17 ENCOUNTER — Telehealth: Payer: Self-pay | Admitting: *Deleted

## 2013-03-17 NOTE — Assessment & Plan Note (Signed)
The patient's symptoms are a bit concerning for angina. Otherwise could undergo cardiopulmonary exercise treadmill testing. It is unclear whether her dyspnea and chest discomfort are related to chronotropic incompetence, coronary artery disease, or something else. If she has evidence of ischemia, left heart catheterization will be warranted.

## 2013-03-17 NOTE — Progress Notes (Signed)
HPI [name] [name] returns today for followup. She is a 62 year old woman with a history of coronary artery disease, aortic valve replacement, symptomatic bradycardia, status post pacemaker insertion, dyslipidemia,hypertension, and diabetes. In the interim, she has had problems with both of dizziness as well as chest tightness. When she experiences chest tightness, she will sit down and rest and her symptoms will resolve. There is also associated shortness of breath. When she experiences chest discomfort, she will also experiences dizziness. She denies peripheral edema or syncope. No fevers or chills. Allergies  Allergen Reactions  . Clindamycin/Lincomycin Shortness Of Breath and Swelling  . Gentamycin [Gentamicin] Hives and Itching    Reaction noted post loop recorder implant, where gentamycin was used for irrigation.  . Tizanidine     Hallucinate,rash  . Atorvastatin     Muscle and joint pain  . Avandia [Rosiglitazone Maleate]     Congestive heart failure  . Codeine Nausea And Vomiting  . Erythromycin Nausea And Vomiting  . Metformin     Congestive heart failure  . Simvastatin     Increased LFT's  . Cephalexin Swelling and Rash  . Iodinated Diagnostic Agents Other (See Comments)    PAIN DURING LYMPHANGIOGRAM '86, NO ALLERGY TO IV CONTRAST  . Neosporin [Neomycin-Bacitracin Zn-Polymyx] Rash  . Penicillins Swelling and Rash     Current Outpatient Prescriptions  Medication Sig Dispense Refill  . ALPRAZolam (XANAX) 0.25 MG tablet Take 1 tablet (0.25 mg total) by mouth at bedtime.  90 tablet  3  . aspirin EC 81 MG tablet Take 81 mg by mouth daily.      . Benzonatate (TESSALON PERLES PO) Take by mouth every 6 (six) hours as needed.      . Calcium Carbonate-Vitamin D (RA CALCIUM PLUS VITAMIN D) 600-400 MG-UNIT per tablet Take 1 tablet by mouth 2 (two) times daily.       . cholecalciferol (VITAMIN D) 1000 UNITS tablet Take 1,000 Units by mouth daily.        . Coenzyme Q10 (COQ10 PO) Take  400 mg by mouth daily.       Marland Kitchen desoximetasone (TOPICORT) 0.05 % cream Apply 1 application topically 2 (two) times daily as needed. When skin starts peeling      . ezetimibe (ZETIA) 10 MG tablet Take 1 tablet (10 mg total) by mouth daily.  90 tablet  3  . fenofibrate (TRICOR) 145 MG tablet TAKE ONE (1) TABLET EACH DAY  30 tablet  2  . ferrous fumarate (FERRO-SEQUELS) 50 MG CR tablet Take 50 mg by mouth daily.      . furosemide (LASIX) 80 MG tablet Take 0.5 tablets (40 mg total) by mouth daily.  30 tablet  3  . GLUCAGON EMERGENCY 1 MG injection       . insulin glulisine (APIDRA) 100 UNIT/ML injection Inject into the skin daily. Sliding scale (insulin pump)      . Krill Oil Omega-3 300 MG CAPS Take 1 capsule by mouth daily.      . metoprolol tartrate (LOPRESSOR) 50 MG tablet Take 1 tablet (50 mg total) by mouth 2 (two) times daily as needed.  120 tablet  6  . Multiple Vitamins-Minerals (MULTIVITAMIN WITH MINERALS) tablet Take 1 tablet by mouth daily.        Marland Kitchen NITROLINGUAL 0.4 MG/SPRAY spray USE ONE SPRAY UNDER TONGUE EVERY 5 MINUTES AS NEEDED FOR CHEST PAIN. MAY REPEAT THREE TIMES  4.9 g  3  . omeprazole (PRILOSEC) 20 MG capsule Take 20 mg  by mouth daily. Before breakfast as needed      . polyethylene glycol powder (GLYCOLAX/MIRALAX) powder Take 17 g by mouth daily. 1/2 - 1 dose every day.      . potassium chloride SA (K-DUR,KLOR-CON) 20 MEQ tablet Take 40 mEq by mouth daily.      . pravastatin (PRAVACHOL) 10 MG tablet TAKE ONE (1) TABLET BY MOUTH EVERY DAY  90 tablet  3  . salmeterol (SEREVENT DISKUS) 50 MCG/DOSE diskus inhaler Inhale 2 puffs into the lungs 2 (two) times daily.  1 Inhaler  12  . sitaGLIPtan (JANUVIA) 100 MG tablet Take 100 mg by mouth daily.        . vitamin C (ASCORBIC ACID) 500 MG tablet Take 500 mg by mouth daily.      . fluticasone (FLONASE) 50 MCG/ACT nasal spray Place 2 sprays into the nose daily.  16 g  2   No current facility-administered medications for this visit.      Past Medical History  Diagnosis Date  . Aortic stenosis      bicuspid aortic valve; mean gradient of 20 mmHg in 2/10; aortic valve   replacement surgery(19-mm Edwards pericardial valve) with a redo coronary artery bypass graft procedure      in 07/2009; postoperative Dressler's syndrome    . Coronary artery disease     status post RCA saphenous vein graft in 06/1999; multiple preceding PCIs with restenosis; total obstruction of RCA in 2006 with patent graft   . CHF (congestive heart failure)     History of CHF; recurred following AVR/CABG surgery; intermittent PND  . Hyperlipidemia   . Hypertension   . Small bowel obstruction     Recurrent; resolved after resection of a lipoma  . Osteopenia      hip on DEXA in October 2007.  Marland Kitchen Anemia   . Carcinoma, renal cell 05/2007    Laparoscopic right nephrectomy  . NASH (nonalcoholic steatohepatitis) 1999    -biopsy in 1999  . Diabetes mellitus 03/2010     TYPE II: Hemoglobin A1c of 7.4 in  03/2010; 8.4 in 06/2010; treated with insulin pump  . Migraines   . Duodenal ulcer     Remote; H. pylori positive  . Colitis, ischemic 2008  . Gastroesophageal reflux disease     ? of GASTROPARESIS; IRRITABLE BOWEL SYNDROME  . Hodgkin's disease 1991    Mantle radiation therapy  . Exertional dyspnea     chest discomfort  . Statin intolerance   . Hypercalcemia     Ca-11.1 in 07/2011  . Vitamin D deficiency   . Costochondritis   . Insomnia   . Anemia   . Gastroparesis due to DM 05/01/2012  . PONV (postoperative nausea and vomiting)   . Hyperplastic gastric polyp 06/23/2012  . Arthritis   . Heart murmur   . Osteoporosis   . Pacemaker 08/21/2012    Medtronic Adapta L dual-chamber pacemaker-Dr. Lovena Le    ROS:   All systems reviewed and negative except as noted in the HPI.   Past Surgical History  Procedure Laterality Date  . Total abdominal hysterectomy w/ bilateral salpingoophorectomy  2000  . Breast excisional biopsy      benign,  bilateral  . Nephrectomy  2008    Laparoscopic right nephrectomy, renal cell carcinoma  . Coronary artery bypass graft  2005, 2011    CABG-most recent SVG to RCA-12/05; RCA occluded with patent graft in 2006; Redo bypass in 07/2009  . Tumor excision  1981  removal of Hodgkins lymphoma  . Exploratory laparotomy w/ bowel resection      Small bowel resection of lipoma  . Aortic valve replacement  2011    Aortic valve replacement surgery with a 19 mm bioprosthetic device post redo CABG January 2011.  . Colonoscopy      Multiple with adenomatous polyps  . Cervical biopsy    . Liver biopsy    . Bone marrow biopsy    . Esophagogastroduodenoscopy    . Esophagogastroduodenoscopy  06/23/2012    Procedure: ESOPHAGOGASTRODUODENOSCOPY (EGD);  Surgeon: Gatha Mayer, MD;  Location: [facility];  Service: Endoscopy;  Laterality: N/A;  . Colonoscopy  06/23/2012    Procedure: COLONOSCOPY;  Surgeon: Gatha Mayer, MD;  Location: [facility];  Service: Endoscopy;  Laterality: N/A;  [name] [name] dilation  06/23/2012    Procedure: [name] [name] DILATION;  Surgeon: Gatha Mayer, MD;  Location: [facility];  Service: Endoscopy;;  54 fr  . Pacemaker insertion  08/21/2012    Medtronic Adapta L dual-chamber pacemaker     Family History  Problem Relation Age of Onset  . Heart attack Mother   . Heart disease Mother   . Heart attack Father   . Diabetes Father   . Heart disease Father   . Hypertension Brother   . Glaucoma Brother   . Lung cancer Sister     meta  . Cancer Sister     breast - half sister (Paternal)  . Ovarian cancer Sister   . Cancer Sister     cervical and lung - half sister (maternal)  . Breast cancer Sister   . Colon cancer Neg Hx   . Stomach cancer Neg Hx      History   Social History  . Marital Status: Married    Spouse Name: N/A    Number of Children: 2  . Years of Education: N/A   Occupational History  . full time     elementary principal   Social History Main Topics   . Smoking status: Never Smoker   . Smokeless tobacco: Never Used  . Alcohol Use: No  . Drug Use: No  . Sexual Activity: Not on file   Other Topics Concern  . 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     BP 108/65  Pulse 81  Ht 5' 2"  (1.575 m)  Wt 127 lb (57.607 kg)  BMI 23.22 kg/m2  Physical Exam:  Well appearing middle-age woman, NAD HEENT: Unremarkable Neck:  7 cm JVD, no thyromegally Back:  No CVA tenderness Lungs:  Clear with no wheezes, rales, or rhonchi. HEART:  Regular rate rhythm, no murmurs, no rubs, no clicks Abd:  soft, positive bowel sounds, no organomegally, no rebound, no guarding Ext:  2 plus pulses, no edema, no cyanosis, no clubbing Skin:  No rashes no nodules Neuro:  CN II through XII intact, motor grossly intact  ECG - normal sinus rhythm with nonspecific ST-T wave abnormality  DEVICE  Normal device function.  See PaceArt for details.   Assess/Plan:

## 2013-03-17 NOTE — Telephone Encounter (Signed)
Ok to reduce potassium by half. GT

## 2013-03-17 NOTE — Telephone Encounter (Signed)
Pt was seen by Dr Lovena Le yesterday and her lasix was cut in half, she is wanting to know if she should cut her patasium in half also.

## 2013-03-17 NOTE — Telephone Encounter (Signed)
Left detailed message for pt to reduce, to call office with any further questions if needed

## 2013-03-17 NOTE — Assessment & Plan Note (Signed)
Pacemaker interrogation demonstrates 2 episodes of atrial fibrillation lasting less than 2 minutes each. No change in medical therapy at this point.

## 2013-03-17 NOTE — Assessment & Plan Note (Signed)
It is unclear to me what component her diastolic heart failure is playing in her symptoms. Hopefully this will be sorted out with cardiopulmonary stress test. She may ultimately require right and left heart catheterization.

## 2013-03-17 NOTE — Telephone Encounter (Signed)
Please advise 

## 2013-03-19 NOTE — Telephone Encounter (Signed)
.  left message to have patient return my call on home and mobile number to make sure she received the previous message concerning her medication management

## 2013-03-19 NOTE — Telephone Encounter (Signed)
CBI:[id number] continued, pt called to advise her endo MD drew a lab called [id number]-[id number] and noted pt level is to be below 225 however her levels came back as 661, this nurse tried to locate the labs on the care everywhere ap per pt noted labs performed at [facility] on 03-12-13 per Dr Evon Slack, unable to acquire per technical issues, ticket placed to recover labs if possible via care everywhere

## 2013-03-19 NOTE — Telephone Encounter (Signed)
Pt called back to advise she did get her message and noted she went to her endocrinologist and she had some tests run that she wanted to let Dr Lovena Le be aware of

## 2013-03-24 ENCOUNTER — Telehealth: Payer: Self-pay | Admitting: *Deleted

## 2013-03-24 DIAGNOSIS — I10 Essential (primary) hypertension: Secondary | ICD-10-CM

## 2013-03-24 DIAGNOSIS — I5030 Unspecified diastolic (congestive) heart failure: Secondary | ICD-10-CM

## 2013-03-24 DIAGNOSIS — I4891 Unspecified atrial fibrillation: Secondary | ICD-10-CM

## 2013-03-24 NOTE — Telephone Encounter (Signed)
Pt had medication changes and she has some concerns. She has been having to take extra lasix still. Pluse she has not been able to function to drive with other medications being increased. She feels okay if she is just laying around the house.

## 2013-03-24 NOTE — Telephone Encounter (Signed)
.  left message to have patient return my call.  

## 2013-03-26 ENCOUNTER — Encounter: Payer: Self-pay | Admitting: Oncology

## 2013-03-26 NOTE — Telephone Encounter (Signed)
Patient called back, however needs to have call back to mobile number listed.  Kim called the other day about medication changes.

## 2013-03-26 NOTE — Telephone Encounter (Signed)
Spoke to patient concerning lab/test results/instructions from provider. Patient understood.

## 2013-03-26 NOTE — Telephone Encounter (Signed)
Please have BMET drawn and have follow-up appt with [facility] or [place]. This is one of [facility]'s long term patients with multiple problems and sensitivities.

## 2013-03-26 NOTE — Telephone Encounter (Signed)
Pt adivsed that during her last visit with the MD that they increased her metoprolol to 50 mg BID and Lasix to 40 mg daily.  Pt states that while taking the metoprolol 50 mg BID she felt extremely dizzy/confused/and had ringing in her ears. So she cut back on the Metoprolol to 25 mg in AM and 50 mg in the PM. She still feels dizzy/lightheaed. Also pt is taking the whole 80 mg of lasix instead of half. Pt states she wakes up every  Morning with crackling in the chest and very SOB. She started taking her medication like this last Saturday. Please advise

## 2013-03-31 ENCOUNTER — Ambulatory Visit (HOSPITAL_COMMUNITY): Payer: BC Managed Care – PPO | Attending: Internal Medicine

## 2013-03-31 ENCOUNTER — Encounter: Payer: Self-pay | Admitting: *Deleted

## 2013-03-31 DIAGNOSIS — I495 Sick sinus syndrome: Secondary | ICD-10-CM

## 2013-03-31 DIAGNOSIS — I350 Nonrheumatic aortic (valve) stenosis: Secondary | ICD-10-CM

## 2013-03-31 DIAGNOSIS — R0602 Shortness of breath: Secondary | ICD-10-CM | POA: Insufficient documentation

## 2013-03-31 DIAGNOSIS — I5030 Unspecified diastolic (congestive) heart failure: Secondary | ICD-10-CM

## 2013-03-31 DIAGNOSIS — I4892 Unspecified atrial flutter: Secondary | ICD-10-CM

## 2013-03-31 DIAGNOSIS — I2510 Atherosclerotic heart disease of native coronary artery without angina pectoris: Secondary | ICD-10-CM

## 2013-03-31 DIAGNOSIS — D170 Benign lipomatous neoplasm of skin and subcutaneous tissue of head, face and neck: Secondary | ICD-10-CM

## 2013-03-31 DIAGNOSIS — R002 Palpitations: Secondary | ICD-10-CM

## 2013-03-31 DIAGNOSIS — I4891 Unspecified atrial fibrillation: Secondary | ICD-10-CM

## 2013-03-31 DIAGNOSIS — I10 Essential (primary) hypertension: Secondary | ICD-10-CM

## 2013-03-31 LAB — BASIC METABOLIC PANEL
Chloride: 104 mEq/L (ref 96–112)
Creat: 1.48 mg/dL — ABNORMAL HIGH (ref 0.50–1.10)

## 2013-04-03 ENCOUNTER — Inpatient Hospital Stay (HOSPITAL_COMMUNITY)
Admission: EM | Admit: 2013-04-03 | Discharge: 2013-04-11 | DRG: 550 | Disposition: A | Discharge status: Home / Self Care | Payer: BC Managed Care – PPO | Attending: Cardiology | Admitting: Cardiology

## 2013-04-03 ENCOUNTER — Encounter (HOSPITAL_COMMUNITY): Payer: Self-pay | Admitting: General Practice

## 2013-04-03 ENCOUNTER — Other Ambulatory Visit: Payer: Self-pay

## 2013-04-03 ENCOUNTER — Encounter: Payer: Self-pay | Admitting: Cardiology

## 2013-04-03 ENCOUNTER — Encounter: Payer: Self-pay | Admitting: *Deleted

## 2013-04-03 ENCOUNTER — Ambulatory Visit (INDEPENDENT_AMBULATORY_CARE_PROVIDER_SITE_OTHER): Payer: BC Managed Care – PPO | Admitting: Cardiology

## 2013-04-03 VITALS — BP 127/54 | HR 85 | Ht 62.0 in | Wt 126.0 lb

## 2013-04-03 DIAGNOSIS — Z801 Family history of malignant neoplasm of trachea, bronchus and lung: Secondary | ICD-10-CM

## 2013-04-03 DIAGNOSIS — Z952 Presence of prosthetic heart valve: Secondary | ICD-10-CM

## 2013-04-03 DIAGNOSIS — N183 Chronic kidney disease, stage 3 unspecified: Secondary | ICD-10-CM | POA: Diagnosis present

## 2013-04-03 DIAGNOSIS — D649 Anemia, unspecified: Secondary | ICD-10-CM

## 2013-04-03 DIAGNOSIS — Z9641 Presence of insulin pump (external) (internal): Secondary | ICD-10-CM

## 2013-04-03 DIAGNOSIS — I509 Heart failure, unspecified: Secondary | ICD-10-CM | POA: Diagnosis present

## 2013-04-03 DIAGNOSIS — I5021 Acute systolic (congestive) heart failure: Secondary | ICD-10-CM

## 2013-04-03 DIAGNOSIS — Z8049 Family history of malignant neoplasm of other genital organs: Secondary | ICD-10-CM

## 2013-04-03 DIAGNOSIS — K219 Gastro-esophageal reflux disease without esophagitis: Secondary | ICD-10-CM | POA: Diagnosis present

## 2013-04-03 DIAGNOSIS — I059 Rheumatic mitral valve disease, unspecified: Secondary | ICD-10-CM | POA: Diagnosis present

## 2013-04-03 DIAGNOSIS — Z9221 Personal history of antineoplastic chemotherapy: Secondary | ICD-10-CM

## 2013-04-03 DIAGNOSIS — I2789 Other specified pulmonary heart diseases: Secondary | ICD-10-CM | POA: Diagnosis present

## 2013-04-03 DIAGNOSIS — I359 Nonrheumatic aortic valve disorder, unspecified: Secondary | ICD-10-CM

## 2013-04-03 DIAGNOSIS — E042 Nontoxic multinodular goiter: Secondary | ICD-10-CM | POA: Diagnosis present

## 2013-04-03 DIAGNOSIS — I2510 Atherosclerotic heart disease of native coronary artery without angina pectoris: Secondary | ICD-10-CM

## 2013-04-03 DIAGNOSIS — Z955 Presence of coronary angioplasty implant and graft: Secondary | ICD-10-CM

## 2013-04-03 DIAGNOSIS — Z83511 Family history of glaucoma: Secondary | ICD-10-CM

## 2013-04-03 DIAGNOSIS — Z86010 Personal history of colonic polyps: Secondary | ICD-10-CM

## 2013-04-03 DIAGNOSIS — I25810 Atherosclerosis of coronary artery bypass graft(s) without angina pectoris: Secondary | ICD-10-CM

## 2013-04-03 DIAGNOSIS — G4700 Insomnia, unspecified: Secondary | ICD-10-CM | POA: Diagnosis present

## 2013-04-03 DIAGNOSIS — I2582 Chronic total occlusion of coronary artery: Secondary | ICD-10-CM | POA: Diagnosis present

## 2013-04-03 DIAGNOSIS — Z23 Encounter for immunization: Secondary | ICD-10-CM

## 2013-04-03 DIAGNOSIS — Z888 Allergy status to other drugs, medicaments and biological substances status: Secondary | ICD-10-CM

## 2013-04-03 DIAGNOSIS — E1049 Type 1 diabetes mellitus with other diabetic neurological complication: Secondary | ICD-10-CM | POA: Diagnosis present

## 2013-04-03 DIAGNOSIS — E785 Hyperlipidemia, unspecified: Secondary | ICD-10-CM | POA: Diagnosis present

## 2013-04-03 DIAGNOSIS — I200 Unstable angina: Secondary | ICD-10-CM

## 2013-04-03 DIAGNOSIS — I5032 Chronic diastolic (congestive) heart failure: Secondary | ICD-10-CM | POA: Diagnosis present

## 2013-04-03 DIAGNOSIS — I495 Sick sinus syndrome: Secondary | ICD-10-CM | POA: Diagnosis present

## 2013-04-03 DIAGNOSIS — K3184 Gastroparesis: Secondary | ICD-10-CM | POA: Diagnosis present

## 2013-04-03 DIAGNOSIS — F411 Generalized anxiety disorder: Secondary | ICD-10-CM | POA: Diagnosis present

## 2013-04-03 DIAGNOSIS — Z85528 Personal history of other malignant neoplasm of kidney: Secondary | ICD-10-CM

## 2013-04-03 DIAGNOSIS — Z79899 Other long term (current) drug therapy: Secondary | ICD-10-CM

## 2013-04-03 DIAGNOSIS — Z8711 Personal history of peptic ulcer disease: Secondary | ICD-10-CM

## 2013-04-03 DIAGNOSIS — Z7982 Long term (current) use of aspirin: Secondary | ICD-10-CM

## 2013-04-03 DIAGNOSIS — I5030 Unspecified diastolic (congestive) heart failure: Secondary | ICD-10-CM

## 2013-04-03 DIAGNOSIS — Z803 Family history of malignant neoplasm of breast: Secondary | ICD-10-CM

## 2013-04-03 DIAGNOSIS — R0602 Shortness of breath: Secondary | ICD-10-CM

## 2013-04-03 DIAGNOSIS — Z833 Family history of diabetes mellitus: Secondary | ICD-10-CM

## 2013-04-03 DIAGNOSIS — Z881 Allergy status to other antibiotic agents status: Secondary | ICD-10-CM

## 2013-04-03 DIAGNOSIS — Z8774 Personal history of (corrected) congenital malformations of heart and circulatory system: Secondary | ICD-10-CM

## 2013-04-03 DIAGNOSIS — I350 Nonrheumatic aortic (valve) stenosis: Secondary | ICD-10-CM

## 2013-04-03 DIAGNOSIS — D131 Benign neoplasm of stomach: Secondary | ICD-10-CM | POA: Diagnosis present

## 2013-04-03 DIAGNOSIS — E119 Type 2 diabetes mellitus without complications: Secondary | ICD-10-CM

## 2013-04-03 DIAGNOSIS — M810 Age-related osteoporosis without current pathological fracture: Secondary | ICD-10-CM | POA: Diagnosis present

## 2013-04-03 DIAGNOSIS — Z880 Allergy status to penicillin: Secondary | ICD-10-CM

## 2013-04-03 DIAGNOSIS — I130 Hypertensive heart and chronic kidney disease with heart failure and stage 1 through stage 4 chronic kidney disease, or unspecified chronic kidney disease: Secondary | ICD-10-CM | POA: Diagnosis present

## 2013-04-03 DIAGNOSIS — R079 Chest pain, unspecified: Secondary | ICD-10-CM

## 2013-04-03 DIAGNOSIS — Z950 Presence of cardiac pacemaker: Secondary | ICD-10-CM

## 2013-04-03 DIAGNOSIS — Z8249 Family history of ischemic heart disease and other diseases of the circulatory system: Secondary | ICD-10-CM

## 2013-04-03 DIAGNOSIS — C8190 Hodgkin lymphoma, unspecified, unspecified site: Secondary | ICD-10-CM | POA: Diagnosis present

## 2013-04-03 DIAGNOSIS — N189 Chronic kidney disease, unspecified: Secondary | ICD-10-CM

## 2013-04-03 DIAGNOSIS — Z951 Presence of aortocoronary bypass graft: Secondary | ICD-10-CM

## 2013-04-03 DIAGNOSIS — D509 Iron deficiency anemia, unspecified: Secondary | ICD-10-CM | POA: Diagnosis present

## 2013-04-03 DIAGNOSIS — K559 Vascular disorder of intestine, unspecified: Secondary | ICD-10-CM

## 2013-04-03 DIAGNOSIS — I10 Essential (primary) hypertension: Secondary | ICD-10-CM | POA: Diagnosis present

## 2013-04-03 DIAGNOSIS — Z794 Long term (current) use of insulin: Secondary | ICD-10-CM

## 2013-04-03 DIAGNOSIS — K7689 Other specified diseases of liver: Secondary | ICD-10-CM | POA: Diagnosis present

## 2013-04-03 DIAGNOSIS — Z7902 Long term (current) use of antithrombotics/antiplatelets: Secondary | ICD-10-CM

## 2013-04-03 DIAGNOSIS — E1069 Type 1 diabetes mellitus with other specified complication: Secondary | ICD-10-CM | POA: Diagnosis present

## 2013-04-03 DIAGNOSIS — Z905 Acquired absence of kidney: Secondary | ICD-10-CM

## 2013-04-03 DIAGNOSIS — E559 Vitamin D deficiency, unspecified: Secondary | ICD-10-CM | POA: Diagnosis present

## 2013-04-03 DIAGNOSIS — R195 Other fecal abnormalities: Secondary | ICD-10-CM

## 2013-04-03 DIAGNOSIS — K922 Gastrointestinal hemorrhage, unspecified: Secondary | ICD-10-CM | POA: Diagnosis present

## 2013-04-03 DIAGNOSIS — I700 Atherosclerosis of aorta: Secondary | ICD-10-CM | POA: Diagnosis present

## 2013-04-03 DIAGNOSIS — Z923 Personal history of irradiation: Secondary | ICD-10-CM

## 2013-04-03 DIAGNOSIS — Z8041 Family history of malignant neoplasm of ovary: Secondary | ICD-10-CM

## 2013-04-03 HISTORY — DX: Iron deficiency anemia, unspecified: D50.9

## 2013-04-03 HISTORY — DX: Anxiety disorder, unspecified: F41.9

## 2013-04-03 HISTORY — DX: Chronic diastolic (congestive) heart failure: I50.32

## 2013-04-03 LAB — BASIC METABOLIC PANEL
CO2: 30 mEq/L (ref 19–32)
Calcium: 10 mg/dL (ref 8.4–10.5)
Creatinine, Ser: 1.6 mg/dL — ABNORMAL HIGH (ref 0.4–1.2)
GFR: 35.2 mL/min — ABNORMAL LOW (ref >60.00)
Glucose, Bld: 144 mg/dL — ABNORMAL HIGH (ref 70–99)
Sodium: 137 mEq/L (ref 135–145)

## 2013-04-03 LAB — COMPREHENSIVE METABOLIC PANEL
AST: 39 U/L — ABNORMAL HIGH (ref 0–37)
Albumin: 4.5 g/dL (ref 3.5–5.2)
Calcium: 11.1 mg/dL — ABNORMAL HIGH (ref 8.4–10.5)
Creatinine, Ser: 1.44 mg/dL — ABNORMAL HIGH (ref 0.50–1.10)
Sodium: 141 mEq/L (ref 135–145)

## 2013-04-03 LAB — CBC WITH DIFFERENTIAL/PLATELET
Basophils Absolute: 0.1 10*3/uL (ref 0.0–0.1)
Eosinophils Absolute: 0.2 10*3/uL (ref 0.0–0.7)
Hemoglobin: 7.3 g/dL — CL (ref 12.0–15.0)
Lymphocytes Relative: 15.1 % (ref 12.0–46.0)
MCHC: 31.4 g/dL (ref 30.0–36.0)
Monocytes Relative: 5.5 % (ref 3.0–12.0)
Neutro Abs: 5.7 10*3/uL (ref 1.4–7.7)
Neutrophils Relative %: 75.6 % (ref 43.0–77.0)
Platelets: 218 10*3/uL (ref 150.0–400.0)
RDW: 18 % — ABNORMAL HIGH (ref 11.5–14.6)

## 2013-04-03 LAB — CBC
MCH: 23.5 pg — ABNORMAL LOW (ref 26.0–34.0)
MCV: 77.6 fL — ABNORMAL LOW (ref 78.0–100.0)
Platelets: 248 10*3/uL (ref 150–400)
RDW: 16.8 % — ABNORMAL HIGH (ref 11.5–15.5)
WBC: 8.3 10*3/uL (ref 4.0–10.5)

## 2013-04-03 LAB — PROTIME-INR
INR: 1.3 ratio — ABNORMAL HIGH (ref 0.8–1.0)
Prothrombin Time: 13.2 s — ABNORMAL HIGH (ref 10.2–12.4)

## 2013-04-03 LAB — GLUCOSE, CAPILLARY: Glucose-Capillary: 96 mg/dL (ref 70–99)

## 2013-04-03 LAB — PREPARE RBC (CROSSMATCH)

## 2013-04-03 MED ORDER — POLYETHYLENE GLYCOL 3350 17 GM/SCOOP PO POWD
17.0000 g | Freq: Every day | ORAL | Status: DC
  Filled 2013-04-03: qty 255

## 2013-04-03 MED ORDER — INFLUENZA VAC SPLIT QUAD 0.5 ML IM SUSP
0.5000 mL | INTRAMUSCULAR | Status: AC
  Administered 2013-04-04: 10:00:00 0.5 mL via INTRAMUSCULAR
  Filled 2013-04-03: qty 0.5

## 2013-04-03 MED ORDER — DOCUSATE SODIUM 100 MG PO CAPS
100.0000 mg | ORAL_CAPSULE | Freq: Every day | ORAL | Status: DC
  Administered 2013-04-03 – 2013-04-11 (×8): 100 mg via ORAL
  Filled 2013-04-03 (×9): qty 1

## 2013-04-03 MED ORDER — FERROUS FUMARATE 50 MG PO TBCR: 50.0000 mg | EXTENDED_RELEASE_TABLET | Freq: Every day | ORAL | Status: DC

## 2013-04-03 MED ORDER — NON FORMULARY: 10.0000 mg | Freq: Every day | Status: DC

## 2013-04-03 MED ORDER — CALCIUM CARBONATE-VITAMIN D 500-200 MG-UNIT PO TABS
1.0000 | ORAL_TABLET | Freq: Two times a day (BID) | ORAL | Status: DC
  Administered 2013-04-03 – 2013-04-11 (×16): 1 via ORAL
  Filled 2013-04-03 (×17): qty 1

## 2013-04-03 MED ORDER — TRIAMCINOLONE ACETONIDE 0.025 % EX CREA
1.0000 "application " | TOPICAL_CREAM | CUTANEOUS | Status: DC | PRN
  Filled 2013-04-03: qty 15

## 2013-04-03 MED ORDER — FERROUS SULFATE 325 (65 FE) MG PO TABS
325.0000 mg | ORAL_TABLET | Freq: Every day | ORAL | Status: DC
  Administered 2013-04-04 – 2013-04-11 (×7): 325 mg via ORAL
  Filled 2013-04-03 (×10): qty 1

## 2013-04-03 MED ORDER — VITAMIN D3 25 MCG (1000 UNIT) PO TABS
1000.0000 [IU] | ORAL_TABLET | Freq: Every day | ORAL | Status: DC
  Administered 2013-04-03 – 2013-04-11 (×9): 1000 [IU] via ORAL
  Filled 2013-04-03 (×9): qty 1

## 2013-04-03 MED ORDER — ALPRAZOLAM 0.25 MG PO TABS
0.2500 mg | ORAL_TABLET | Freq: Every day | ORAL | Status: DC
  Administered 2013-04-03 – 2013-04-07 (×5): 0.25 mg via ORAL
  Filled 2013-04-03 (×5): qty 1

## 2013-04-03 MED ORDER — SODIUM CHLORIDE 0.9 % IJ SOLN
3.0000 mL | Freq: Two times a day (BID) | INTRAMUSCULAR | Status: DC
  Administered 2013-04-04 – 2013-04-07 (×7): 3 mL via INTRAVENOUS

## 2013-04-03 MED ORDER — INSULIN ASPART 100 UNIT/ML ~~LOC~~ SOLN
0.0000 [IU] | Freq: Three times a day (TID) | SUBCUTANEOUS | Status: DC
Start: 2013-04-04 — End: 2013-04-04

## 2013-04-03 MED ORDER — METOPROLOL TARTRATE 25 MG PO TABS: 25.0000 mg | ORAL_TABLET | ORAL | Status: DC

## 2013-04-03 MED ORDER — EZETIMIBE 10 MG PO TABS
10.0000 mg | ORAL_TABLET | Freq: Every day | ORAL | Status: DC
  Administered 2013-04-03 – 2013-04-10 (×8): 10 mg via ORAL
  Filled 2013-04-03 (×10): qty 1

## 2013-04-03 MED ORDER — METOPROLOL TARTRATE 25 MG PO TABS
25.0000 mg | ORAL_TABLET | Freq: Every day | ORAL | Status: DC
  Administered 2013-04-04 – 2013-04-11 (×8): 25 mg via ORAL
  Filled 2013-04-03 (×9): qty 1

## 2013-04-03 MED ORDER — POLYETHYLENE GLYCOL 3350 17 G PO PACK
17.0000 g | PACK | Freq: Every day | ORAL | Status: DC
  Administered 2013-04-04 – 2013-04-10 (×4): 17 g via ORAL
  Filled 2013-04-03 (×9): qty 1

## 2013-04-03 MED ORDER — CALCIUM CARBONATE-VITAMIN D 600-400 MG-UNIT PO TABS: 1.0000 | ORAL_TABLET | Freq: Two times a day (BID) | ORAL | Status: DC

## 2013-04-03 MED ORDER — METOPROLOL TARTRATE 50 MG PO TABS
50.0000 mg | ORAL_TABLET | Freq: Every day | ORAL | Status: DC
  Administered 2013-04-03 – 2013-04-10 (×8): 50 mg via ORAL
  Filled 2013-04-03 (×9): qty 1

## 2013-04-03 MED ORDER — FENOFIBRATE 160 MG PO TABS
160.0000 mg | ORAL_TABLET | Freq: Every day | ORAL | Status: DC
  Administered 2013-04-03 – 2013-04-10 (×8): 160 mg via ORAL
  Filled 2013-04-03 (×10): qty 1

## 2013-04-03 MED ORDER — ONDANSETRON HCL 4 MG/2ML IJ SOLN
4.0000 mg | Freq: Four times a day (QID) | INTRAMUSCULAR | Status: DC | PRN
  Administered 2013-04-04: 4 mg via INTRAVENOUS
  Filled 2013-04-03: qty 2

## 2013-04-03 MED ORDER — KRILL OIL OMEGA-3 300 MG PO CAPS: 1.0000 | ORAL_CAPSULE | Freq: Every day | ORAL | Status: DC

## 2013-04-03 MED ORDER — SODIUM CHLORIDE 0.9 % IV SOLN: 250.0000 mL | INTRAVENOUS | Status: DC | PRN

## 2013-04-03 MED ORDER — SALMETEROL XINAFOATE 50 MCG/DOSE IN AEPB: 2.0000 | INHALATION_SPRAY | Freq: Two times a day (BID) | RESPIRATORY_TRACT | Status: DC

## 2013-04-03 MED ORDER — FUROSEMIDE 40 MG PO TABS
60.0000 mg | ORAL_TABLET | Freq: Every day | ORAL | Status: DC
  Administered 2013-04-04 – 2013-04-06 (×3): 60 mg via ORAL
  Filled 2013-04-03 (×4): qty 1

## 2013-04-03 MED ORDER — ASPIRIN EC 81 MG PO TBEC
81.0000 mg | DELAYED_RELEASE_TABLET | Freq: Every day | ORAL | Status: DC
  Administered 2013-04-03 – 2013-04-08 (×5): 81 mg via ORAL
  Filled 2013-04-03 (×6): qty 1

## 2013-04-03 MED ORDER — SODIUM CHLORIDE 0.9 % IJ SOLN: 3.0000 mL | INTRAMUSCULAR | Status: DC | PRN

## 2013-04-03 MED ORDER — POTASSIUM CHLORIDE CRYS ER 20 MEQ PO TBCR
40.0000 meq | EXTENDED_RELEASE_TABLET | Freq: Every day | ORAL | Status: DC
  Administered 2013-04-04 – 2013-04-11 (×9): 40 meq via ORAL
  Filled 2013-04-03 (×11): qty 2

## 2013-04-03 MED ORDER — NITROGLYCERIN 0.4 MG SL SUBL: 0.4000 mg | SUBLINGUAL_TABLET | SUBLINGUAL | Status: DC | PRN

## 2013-04-03 MED ORDER — FLUTICASONE PROPIONATE 50 MCG/ACT NA SUSP
2.0000 | Freq: Every day | NASAL | Status: DC
  Administered 2013-04-03 – 2013-04-08 (×4): 2 via NASAL
  Filled 2013-04-03: qty 16

## 2013-04-03 MED ORDER — PRAVASTATIN SODIUM 10 MG PO TABS
10.0000 mg | ORAL_TABLET | Freq: Every day | ORAL | Status: DC
  Administered 2013-04-04 – 2013-04-10 (×8): 10 mg via ORAL
  Filled 2013-04-03 (×11): qty 1

## 2013-04-03 MED ORDER — ACETAMINOPHEN 325 MG PO TABS: 650.0000 mg | ORAL_TABLET | ORAL | Status: DC | PRN

## 2013-04-03 MED ORDER — ISOSORBIDE MONONITRATE 15 MG HALF TABLET
15.0000 mg | ORAL_TABLET | Freq: Every day | ORAL | Status: DC
  Administered 2013-04-03 – 2013-04-10 (×6): 15 mg via ORAL
  Filled 2013-04-03 (×9): qty 1

## 2013-04-03 MED ORDER — FUROSEMIDE 10 MG/ML IJ SOLN
40.0000 mg | Freq: Once | INTRAMUSCULAR | Status: AC
  Administered 2013-04-04: 40 mg via INTRAVENOUS
  Filled 2013-04-03: qty 4

## 2013-04-03 MED ORDER — ISOSORBIDE MONONITRATE ER 30 MG PO TB24: ORAL_TABLET | ORAL | Status: DC

## 2013-04-03 NOTE — Progress Notes (Signed)
Patient ID: [name] [name], female   DOB: [birth year], 62 y.o.   MRN: [id number] PCP: Dr. Orson Ape  62 yo with history of CAD s/p CABG in 12/05 and redo in 1/11, bioprosthetic AVR, diastolic CHF, and sick sinus syndrome s/p PCM presents for cardiology followup.  She has been seen by Dr. Lattie Haw in the past and is seeing me for the first time today.  She had initial CABG with SVG-RCA in 12/05 followed by redo SVG-RCA and bioprosthetic AVR in 1/11.  Last echo in 4/14 showed normal EF with well-seated aortic valve, mean gradient 15 mmHg.  She has had diastolic CHF and is on Lasix.    Recently, she has not been doing well.  For the last 2 years, she has had some exertional dyspnea.  However, since July, her dyspnea has been more severe.  She is short of breath walking up a mild incline or any steps.  She is short of breath with faster walking and any form of moderate exertion. She has orthopnea and elevates the head of her bed.  Additionally, she has been getting chest tightness along with the dyspnea with moderate exertion.  This will resolve with rest.  She has had no chest tightness at rest.  The chest pain does feel like prior ischemic chest pain.  Creatinine has been elevated mildly recently.    Earlier this week, she had a CPX test.  This showed severe functional limitation with ischemic ECG changes (inferolateral ST depression and ST elevation in V1 and V2. She had chest pain.    She has an insulin pump followed by an endocrinologist at [facility].   Patient is on a very low dose of pravastatin.  She has been unable to tolerate multiple statins or higher pravastatin dose due to myalgias.    Patient had CBC done in the office today.  Hemoglobin was 7.3 which is down from 11 in 7/14. She denies melena or bright red blood per rectum.   ECG: NSR, LVH, nonspecific T wave changes.   Labs (7/14): hemoglobin 11 Labs (9/14): K 3.9, creatinine 1.48, hemoglobin 7.3  PMH: 1. Sick sinus syndrome s/p Medtronic  PCM.  2. HTN 3. H/o SBO 4. Renal cell carcinoma s/p right nephrectomy in 2008.  5. NASH 6. Diastolic CHF: Echo (1/60) with EF 60-65%, bioprosthetic aortic valve with mean gradient 15 mmHg, mild MR.  CPX (9/14): peak VO2 10.4, VE/VCO2 43.8, inferolateral ST depression and ST elevation in V1/V2 with severe dyspnea and chest pain, severe functional limitation with ischemic changes.  7. TAH-BSO 8. Type II diabetes: Has insulin pump.  9. H/o ischemic colitis. 10. H/o PUD. 11. Diabetic gastroparesis. 12. Hyperlipidemia: Myalgias with Crestor and atorvastatin, elevated LFTs with simvastatin.  Myalgias with > 10 mg pravastatin.  13. CKD 14. Bicuspid aortic valve s/p 19 mm Edwards pericardial valve in 1/11 (had post-operative Dresslers syndrome).   15. CAD: CABG with SVG-RCA in 12/05.  Redo SVG-RCA in 1/11 with AVR.   SH: Married, lives in [place], nonsmoker.   FH: CAD  ROS: All systems reviewed and negative except as per HPI.   Current Outpatient Prescriptions  Medication Sig Dispense Refill  . ALPRAZolam (XANAX) 0.25 MG tablet Take 1 tablet (0.25 mg total) by mouth at bedtime.  90 tablet  3  . aspirin EC 81 MG tablet Take 81 mg by mouth daily.      . Benzonatate (TESSALON PERLES PO) Take by mouth every 6 (six) hours as needed.      Marland Kitchen  Calcium Carbonate-Vitamin D (RA CALCIUM PLUS VITAMIN D) 600-400 MG-UNIT per tablet Take 1 tablet by mouth 2 (two) times daily.       . cholecalciferol (VITAMIN D) 1000 UNITS tablet Take 1,000 Units by mouth daily.        Marland Kitchen desoximetasone (TOPICORT) 0.05 % cream Apply 1 application topically 2 (two) times daily as needed. When skin starts peeling      . ezetimibe (ZETIA) 10 MG tablet Take 1 tablet (10 mg total) by mouth daily.  90 tablet  3  . fenofibrate (TRICOR) 145 MG tablet TAKE ONE (1) TABLET EACH DAY  30 tablet  2  . ferrous fumarate (FERRO-SEQUELS) 50 MG CR tablet Take 50 mg by mouth daily.      . fluticasone (FLONASE) 50 MCG/ACT nasal spray Place 2  sprays into the nose daily.  16 g  2  . GLUCAGON EMERGENCY 1 MG injection       . insulin glulisine (APIDRA) 100 UNIT/ML injection Inject into the skin daily. Sliding scale (insulin pump)      . Krill Oil Omega-3 300 MG CAPS Take 1 capsule by mouth daily.      . metoprolol (LOPRESSOR) 50 MG tablet Take 50 mg by mouth 2 (two) times daily as needed. Take 25 am and 50 pm      . Multiple Vitamins-Minerals (MULTIVITAMIN WITH MINERALS) tablet Take 1 tablet by mouth daily.        Marland Kitchen NITROLINGUAL 0.4 MG/SPRAY spray USE ONE SPRAY UNDER TONGUE EVERY 5 MINUTES AS NEEDED FOR CHEST PAIN. MAY REPEAT THREE TIMES  4.9 g  3  . omeprazole (PRILOSEC OTC) 20 MG tablet Take 20 mg by mouth daily.      . polyethylene glycol powder (GLYCOLAX/MIRALAX) powder Take 17 g by mouth daily. 1/2 - 1 dose every day.      . potassium chloride SA (K-DUR,KLOR-CON) 20 MEQ tablet Take 40 mEq by mouth daily.      . pravastatin (PRAVACHOL) 10 MG tablet TAKE ONE (1) TABLET BY MOUTH EVERY DAY  90 tablet  3  . salmeterol (SEREVENT DISKUS) 50 MCG/DOSE diskus inhaler Inhale 2 puffs into the lungs 2 (two) times daily.  1 Inhaler  12  . vitamin C (ASCORBIC ACID) 500 MG tablet Take 500 mg by mouth daily.      . Coenzyme Q10 (COQ10 PO) Take 400 mg by mouth daily.       . furosemide (LASIX) 40 MG tablet 1 and 1/2 tablets daily      . isosorbide mononitrate (IMDUR) 30 MG 24 hr tablet 1/2 tablet (total 17m) daily  15 tablet  6   No current facility-administered medications for this visit.    BP 127/54  Pulse 85  Ht 5' 2"  (1.575 m)  Wt 57.153 kg (126 lb)  BMI 23.04 kg/m2 General: NAD Neck: JVP 8-9 cm, no thyromegaly or thyroid nodule.  Lungs: Clear to auscultation bilaterally with normal respiratory effort. CV: Nondisplaced PMI.  Heart regular S1/S2, no SM1/D6 3/6 systolic murmur RUSB.  No peripheral edema.  No carotid bruit.  2+ PT pulses bilaterally.  Abdomen: Soft, nontender, no hepatosplenomegaly, no distention.  Skin: Intact without  lesions or rashes.  Neurologic: Alert and oriented x 3.  Psych: Normal affect. Extremities: No clubbing or cyanosis.   Assessment/Plan: 1. CAD: s/p SVG-RCA in 12/05, then redo SVG-RCA in 1/11.  She has been having symptoms consistent with crescendo angina recently.  She had a high risk stress test (some ST  elevation on CPX) done this week.  She has not had any symptoms at rest, only with exertion.  Initially, I talked to her about left/right heart cath on Monday.  However, given the later finding of about 4 point drop in hemoglobin to 7.3 on today's labs, I sent her to the [facility] ER (late afternoon Friday, not many options to evaluate at this time).  Given suspected ischemia, I think that she should have a unit of blood to get hemoglobin at least 8 if low hemoglobin is confirmed on repeat CBC.  - Workup of anemia => to ER, likely needs 1 unit PRBCs.  - Given her symptoms and CPX response, I think that she will still need cardiac cath.  I will likely try to move this to Tuesday rather than Monday to give [name] some flexibility in anemia evaluation.  I would like to at least look at her coronaries to see if there is something that needs to be fixed though PCI with the need for anticoagulation may not be something we can do immediately (based on anemia evaluation).  Certainly, profound anemia could be making angina worse that it would otherwise be.  - Continue ASA 81 mg daily (does not seem to be bleeding overtly, no obvious blood in stool).  - Will start Imdur 15 mg daily (low dose, has had headaches with sublingual NTG).   - Continue Zetia/pravastatin.  She has not been able to tolerate higher statin dosing.  - Continue current dose of beta blocker.  2. Bioprosthetic aortic valve replacement: Stable on last echo.  3. Diastolic CHF: Patient does have some volume overload on exam with elevated JVP.  EF 60-65% on echo in 4/14.  She has NYHA class III symptoms that could be related to coronary ischemia and  anemia as well as volume overload.   - As I am tentatively planning to cath her early next week, I am going to cut Lasix back at least a bit to 60 mg daily given recent rise in creatinine to 1.48.  She will need to watch salt intake closely.  4. CKD: As above, likely cath early next week.  Will decrease Lasix pre-cath to 60 mg daily.  5. Hyperlipidemia: Needs lipids checked (will arrange).  6. Anemia: Found on pre-cath labs.  Hemoglobin 7.3, down 4 pts from prior check.  She denies melena/BRBPR.  This certainly may be contributing to anginal and CHF-type symptoms.  Given ischemia on stress test, would transfuse to hemoglobin > 8.  Would give 40 mg IV Lasix along with pRBC unit.  Will need anemia workup: stool guaiac, Fe studies/B12/folate.  Likely needs GI evaluation.  See discussion above regarding timing of cath.  Given late Friday afternoon, will have her go to ER for evaluation/repeat labs/transfusion.   [name] 04/03/2013 5:51 PM

## 2013-04-03 NOTE — ED Notes (Signed)
Pt c/o sob and abnormal hemoglobin at pcp. Denies any bleeding or any other symptoms at this time. Pt alert and oriented.

## 2013-04-03 NOTE — H&P (Signed)
Patient ID: [name] [name], female   DOB: 1950/07/28, 62 y.o.   MRN: [id number]  HISTORY & PHYSICAL  PCP: Dr. Orson Ape  62 yo with history of CAD s/p CABG in 12/05 and redo in 1/11, bioprosthetic AVR, diastolic CHF, and sick sinus syndrome s/p PCM presents for cardiology followup.  She has been seen by Dr. Lattie Haw in the past and is seeing me for the first time today.  She had initial CABG with SVG-RCA in 12/05 followed by redo SVG-RCA and bioprosthetic AVR in 1/11.  Last echo in 4/14 showed normal EF with well-seated aortic valve, mean gradient 15 mmHg.  She has had diastolic CHF and is on Lasix.    Recently, she has not been doing well.  For the last 2 years, she has had some exertional dyspnea.  However, since July, her dyspnea has been more severe.  She is short of breath walking up a mild incline or any steps.  She is short of breath with faster walking and any form of moderate exertion. She has orthopnea and elevates the head of her bed.  Additionally, she has been getting chest tightness along with the dyspnea with moderate exertion.  This will resolve with rest.  She has had no chest tightness at rest.  The chest pain does feel like prior ischemic chest pain.  Creatinine has been elevated mildly recently.    Earlier this week, she had a CPX test.  This showed severe functional limitation with ischemic ECG changes (inferolateral ST depression and ST elevation in V1 and V2. She had chest pain.    She has an insulin pump followed by an endocrinologist at [facility].   Patient is on a very low dose of pravastatin.  She has been unable to tolerate multiple statins or higher pravastatin dose due to myalgias.    Patient had CBC done in the office today.  Hemoglobin was 7.3 which is down from 11 in 7/14. She denies melena or bright red blood per rectum.   ECG: NSR, LVH, nonspecific T wave changes.   Labs (7/14): hemoglobin 11 Labs (9/14): K 3.9, creatinine 1.48, hemoglobin 7.3  PMH: 1. Sick sinus  syndrome s/p Medtronic PCM.  2. HTN 3. H/o SBO 4. Renal cell carcinoma s/p right nephrectomy in 2008.  5. NASH 6. Diastolic CHF: Echo (4/09) with EF 60-65%, bioprosthetic aortic valve with mean gradient 15 mmHg, mild MR.  CPX (9/14): peak VO2 10.4, VE/VCO2 43.8, inferolateral ST depression and ST elevation in V1/V2 with severe dyspnea and chest pain, severe functional limitation with ischemic changes.  7. TAH-BSO 8. Type II diabetes: Has insulin pump.  9. H/o ischemic colitis. 10. H/o PUD. 11. Diabetic gastroparesis. 12. Hyperlipidemia: Myalgias with Crestor and atorvastatin, elevated LFTs with simvastatin.  Myalgias with > 10 mg pravastatin.  13. CKD 14. Bicuspid aortic valve s/p 19 mm Edwards pericardial valve in 1/11 (had post-operative Dresslers syndrome).   15. CAD: CABG with SVG-RCA in 12/05.  Redo SVG-RCA in 1/11 with AVR.   SH: Married, lives in [place], nonsmoker.   FH: CAD  ROS: All systems reviewed and negative except as per HPI.   No current facility-administered medications for this encounter.   Current Outpatient Prescriptions  Medication Sig Dispense Refill  . ALPRAZolam (XANAX) 0.25 MG tablet Take 1 tablet (0.25 mg total) by mouth at bedtime.  90 tablet  3  . aspirin EC 81 MG tablet Take 81 mg by mouth daily.      . Benzonatate (TESSALON PERLES  PO) Take by mouth every 6 (six) hours as needed.      . Calcium Carbonate-Vitamin D (RA CALCIUM PLUS VITAMIN D) 600-400 MG-UNIT per tablet Take 1 tablet by mouth 2 (two) times daily.       . cholecalciferol (VITAMIN D) 1000 UNITS tablet Take 1,000 Units by mouth daily.        . Coenzyme Q10 (COQ10 PO) Take 400 mg by mouth daily.       Marland Kitchen desoximetasone (TOPICORT) 0.05 % cream Apply 1 application topically 2 (two) times daily as needed. When skin starts peeling      . ezetimibe (ZETIA) 10 MG tablet Take 1 tablet (10 mg total) by mouth daily.  90 tablet  3  . fenofibrate (TRICOR) 145 MG tablet TAKE ONE (1) TABLET EACH DAY  30  tablet  2  . ferrous fumarate (FERRO-SEQUELS) 50 MG CR tablet Take 50 mg by mouth daily.      . fluticasone (FLONASE) 50 MCG/ACT nasal spray Place 2 sprays into the nose daily.  16 g  2  . furosemide (LASIX) 40 MG tablet 1 and 1/2 tablets daily      . GLUCAGON EMERGENCY 1 MG injection       . insulin glulisine (APIDRA) 100 UNIT/ML injection Inject into the skin daily. Sliding scale (insulin pump)      . isosorbide mononitrate (IMDUR) 30 MG 24 hr tablet 1/2 tablet (total 25m) daily  15 tablet  6  . Krill Oil Omega-3 300 MG CAPS Take 1 capsule by mouth daily.      . metoprolol (LOPRESSOR) 50 MG tablet Take 50 mg by mouth 2 (two) times daily as needed. Take 25 am and 50 pm      . Multiple Vitamins-Minerals (MULTIVITAMIN WITH MINERALS) tablet Take 1 tablet by mouth daily.        .Marland KitchenNITROLINGUAL 0.4 MG/SPRAY spray USE ONE SPRAY UNDER TONGUE EVERY 5 MINUTES AS NEEDED FOR CHEST PAIN. MAY REPEAT THREE TIMES  4.9 g  3  . omeprazole (PRILOSEC OTC) 20 MG tablet Take 20 mg by mouth daily.      . polyethylene glycol powder (GLYCOLAX/MIRALAX) powder Take 17 g by mouth daily. 1/2 - 1 dose every day.      . potassium chloride SA (K-DUR,KLOR-CON) 20 MEQ tablet Take 40 mEq by mouth daily.      . pravastatin (PRAVACHOL) 10 MG tablet TAKE ONE (1) TABLET BY MOUTH EVERY DAY  90 tablet  3  . salmeterol (SEREVENT DISKUS) 50 MCG/DOSE diskus inhaler Inhale 2 puffs into the lungs 2 (two) times daily.  1 Inhaler  12  . vitamin C (ASCORBIC ACID) 500 MG tablet Take 500 mg by mouth daily.        BP 144/50  Pulse 86  Temp(Src) 97.5 F (36.4 C) (Oral)  Resp 16  SpO2 100% General: NAD Neck: JVP 8-9 cm, no thyromegaly or thyroid nodule.  Lungs: Clear to auscultation bilaterally with normal respiratory effort. CV: Nondisplaced PMI.  Heart regular S1/S2, no SJ0/D3 3/6 systolic murmur RUSB.  No peripheral edema.  No carotid bruit.  2+ PT pulses bilaterally.  Abdomen: Soft, nontender, no hepatosplenomegaly, no distention.   Skin: Intact without lesions or rashes.  Neurologic: Alert and oriented x 3.  Psych: Normal affect. Extremities: No clubbing or cyanosis.   Assessment/Plan: 1. CAD: s/p SVG-RCA in 12/05, then redo SVG-RCA in 1/11.  She has been having symptoms consistent with crescendo angina recently.  She had a high risk  stress test (some ST elevation on CPX) done this week.  She has not had any symptoms at rest, only with exertion.  Initially, I talked to her about left/right heart cath on Monday.  However, given the later finding of about 4 point drop in hemoglobin to 7.3 on today's labs, I sent her to the [facility] ER (late afternoon Friday, not many options to evaluate at this time).  Given suspected ischemia, I think that she should have a unit of blood to get hemoglobin at least 8 if low hemoglobin is confirmed on repeat CBC.  - Workup of anemia => to ER, likely needs 1 unit PRBCs.  - Given her symptoms and CPX response, I think that she will still need cardiac cath.  I will likely try to move this to Tuesday rather than Monday to give [name] some flexibility in anemia evaluation.  I would like to at least look at her coronaries to see if there is something that needs to be fixed though PCI with the need for anticoagulation may not be something we can do immediately (based on anemia evaluation).  Certainly, profound anemia could be making angina worse that it would otherwise be.  - Continue ASA 81 mg daily (does not seem to be bleeding overtly, no obvious blood in stool).  - Will start Imdur 15 mg daily (low dose, has had headaches with sublingual NTG).   - Continue Zetia/pravastatin.  She has not been able to tolerate higher statin dosing.  - Continue current dose of beta blocker.  2. Bioprosthetic aortic valve replacement: Stable on last echo.  3. Diastolic CHF: Patient does have some volume overload on exam with elevated JVP.  EF 60-65% on echo in 4/14.  She has NYHA class III symptoms that could be related  to coronary ischemia and anemia as well as volume overload.   - As I am tentatively planning to cath her early next week, I am going to cut Lasix back at least a bit to 60 mg daily given recent rise in creatinine to 1.48.  She will need to watch salt intake closely.  4. CKD: As above, likely cath early next week.  Will decrease Lasix pre-cath to 60 mg daily.  5. Hyperlipidemia: Needs lipids checked (will arrange).  6. Anemia: Found on pre-cath labs.  Hemoglobin 7.3, down 4 pts from prior check.  She denies melena/BRBPR.  This certainly may be contributing to anginal and CHF-type symptoms.  Given ischemia on stress test, would transfuse to hemoglobin > 8.  Would give 40 mg IV Lasix along with pRBC unit.  Will need anemia workup: stool guaiac, Fe studies/B12/folate.  Likely needs GI evaluation.  See discussion above regarding timing of cath.  Given late Friday afternoon, will have her go to ER for evaluation/repeat labs/transfusion.   ADDENDUM:   Note above drafted by Einar Crow, MD on office visit today. Patient has arrived to ED. Will admit, check FOBT, transfuse 1 unit pRBC (aim hgb >8), give Lasix 60m IV x 1 after, then restart Lasix 675mPO daily as outlined above given CKD and tentative diagnostic cath scheduled for next week. Consult GI-- may have bearing on PCI timing, if determined to be warranted, after cath. Additional changes as mentioned above.   R.Jacquelynn CreePA-C  04/03/2013 6:39 PM

## 2013-04-03 NOTE — ED Provider Notes (Signed)
CSN: [id number]     Arrival date & time 04/03/13  1748 History   First MD Initiated Contact with Patient 04/03/13 1834     Chief Complaint  Patient presents with  . Abnormal Lab   (Consider location/radiation/quality/duration/timing/severity/associated sxs/prior Treatment) HPI Comments: Patient is a 62 year old female with history of aortic stenosis, Coronary artery disease, CHF, hyperlipidemia, hypertension, small bowel obstruction, Osteopenia, anemia, renal cell carcinoma, [place], diabetes, migraines, duodenal ulcer, Hemic colitis, gastroparesis, Hodgkin's disease, dyspnea, statin intolerance, hypercalcemia, vitamin D deficiency, costochondritis, Pacemaker placement in January 2014 who presents today after being called by her cardiologist office for a hemoglobin of 7.3. She reports normally her anemia is mild and her hemoglobin runs around 11. She has taken iron supplements for this anemia, but they ran out of the brand she was taking. She was told by the pharmacist to continue with her daily multivitamin. She reports that she has had no new symptoms. She had diarrhea on Tuesday that resolved. There is no blood in the diarrhea. No darkening of her stools. She has an associated abdominal cramping which has also resolved. She reports shortness of breath, but this has been going on for years. Labs were obtained earlier today because she is scheduled to have a cardiac cath on Monday. She has had issues since her pacemaker placement. She is not on anticoagulation.  The history is provided by the patient. No language interpreter was used.    Past Medical History  Diagnosis Date  . Aortic stenosis      bicuspid aortic valve; mean gradient of 20 mmHg in 2/10; aortic valve   replacement surgery(19-mm Edwards pericardial valve) with a redo coronary artery bypass graft procedure      in 07/2009; postoperative Dressler's syndrome    . Coronary artery disease     status post RCA saphenous vein graft in 06/1999;  multiple preceding PCIs with restenosis; total obstruction of RCA in 2006 with patent graft   . CHF (congestive heart failure)     History of CHF; recurred following AVR/CABG surgery; intermittent PND  . Hyperlipidemia   . Hypertension   . Small bowel obstruction     Recurrent; resolved after resection of a lipoma  . Osteopenia      hip on DEXA in October 2007.  Marland Kitchen Anemia   . Carcinoma, renal cell 05/2007    Laparoscopic right nephrectomy  . NASH (nonalcoholic steatohepatitis) 1999    -biopsy in 1999  . Diabetes mellitus 03/2010     TYPE II: Hemoglobin A1c of 7.4 in  03/2010; 8.4 in 06/2010; treated with insulin pump  . Migraines   . Duodenal ulcer     Remote; H. pylori positive  . Colitis, ischemic 2008  . Gastroesophageal reflux disease     ? of GASTROPARESIS; IRRITABLE BOWEL SYNDROME  . Hodgkin's disease 1991    Mantle radiation therapy  . Exertional dyspnea     chest discomfort  . Statin intolerance   . Hypercalcemia     Ca-11.1 in 07/2011  . Vitamin D deficiency   . Costochondritis   . Insomnia   . Anemia   . Gastroparesis due to DM 05/01/2012  . PONV (postoperative nausea and vomiting)   . Hyperplastic gastric polyp 06/23/2012  . Arthritis   . Heart murmur   . Osteoporosis   . Pacemaker 08/21/2012    Medtronic Adapta L dual-chamber pacemaker-Dr. Lovena Le   Past Surgical History  Procedure Laterality Date  . Total abdominal hysterectomy w/ bilateral salpingoophorectomy  2000  .  Breast excisional biopsy      benign, bilateral  . Nephrectomy  2008    Laparoscopic right nephrectomy, renal cell carcinoma  . Coronary artery bypass graft  2005, 2011    CABG-most recent SVG to RCA-12/05; RCA occluded with patent graft in 2006; Redo bypass in 07/2009  . Tumor excision  1981    removal of Hodgkins lymphoma  . Exploratory laparotomy w/ bowel resection      Small bowel resection of lipoma  . Aortic valve replacement  2011    Aortic valve replacement surgery with a 19 mm  bioprosthetic device post redo CABG January 2011.  . Colonoscopy      Multiple with adenomatous polyps  . Cervical biopsy    . Liver biopsy    . Bone marrow biopsy    . Esophagogastroduodenoscopy    . Esophagogastroduodenoscopy  06/23/2012    Procedure: ESOPHAGOGASTRODUODENOSCOPY (EGD);  Surgeon: Gatha Mayer, MD;  Location: [facility];  Service: Endoscopy;  Laterality: N/A;  . Colonoscopy  06/23/2012    Procedure: COLONOSCOPY;  Surgeon: Gatha Mayer, MD;  Location: [facility];  Service: Endoscopy;  Laterality: N/A;  [name] dilation  06/23/2012    Procedure: [name] DILATION;  Surgeon: Gatha Mayer, MD;  Location: [facility];  Service: Endoscopy;;  56 fr  . Pacemaker insertion  08/21/2012    Medtronic Adapta L dual-chamber pacemaker   Family History  Problem Relation Age of Onset  . Heart attack Mother   . Heart disease Mother   . Heart attack Father   . Diabetes Father   . Heart disease Father   . Hypertension Brother   . Glaucoma Brother   . Lung cancer Sister     meta  . Cancer Sister     breast - half sister (Paternal)  . Ovarian cancer Sister   . Cancer Sister     cervical and lung - half sister (maternal)  . Breast cancer Sister   . Colon cancer Neg Hx   . Stomach cancer Neg Hx    History  Substance Use Topics  . Smoking status: Never Smoker   . Smokeless tobacco: Never Used  . Alcohol Use: No   OB History   Grav Para Term Preterm Abortions TAB SAB Ect Mult Living                 Review of Systems  Constitutional: Negative for fever and chills.  Respiratory: Positive for shortness of breath (chronic).   Cardiovascular: Negative for chest pain.  Gastrointestinal: Positive for diarrhea (resolved). Negative for nausea, vomiting, abdominal pain, constipation, blood in stool, abdominal distention and anal bleeding.  Neurological: Negative for light-headedness.  All other systems reviewed and are negative.    Allergies  Clindamycin/lincomycin;  Gentamycin; Tizanidine; Atorvastatin; Avandia; Codeine; Erythromycin; Metformin; Simvastatin; Cephalexin; Iodinated diagnostic agents; Neosporin; and Penicillins  Home Medications   Current Outpatient Rx  Name  Route  Sig  Dispense  Refill  . ALPRAZolam (XANAX) 0.25 MG tablet   Oral   Take 1 tablet (0.25 mg total) by mouth at bedtime.   90 tablet   3   . aspirin EC 81 MG tablet   Oral   Take 81 mg by mouth daily.         . Benzonatate (TESSALON PERLES PO)   Oral   Take by mouth every 6 (six) hours as needed.         . Calcium Carbonate-Vitamin D (RA CALCIUM PLUS VITAMIN D)  600-400 MG-UNIT per tablet   Oral   Take 1 tablet by mouth 2 (two) times daily.          . cholecalciferol (VITAMIN D) 1000 UNITS tablet   Oral   Take 1,000 Units by mouth daily.           . Coenzyme Q10 (COQ10 PO)   Oral   Take 400 mg by mouth daily.          Marland Kitchen desoximetasone (TOPICORT) 0.05 % cream   Topical   Apply 1 application topically 2 (two) times daily as needed. When skin starts peeling         . ezetimibe (ZETIA) 10 MG tablet   Oral   Take 1 tablet (10 mg total) by mouth daily.   90 tablet   3   . fenofibrate (TRICOR) 145 MG tablet      TAKE ONE (1) TABLET EACH DAY   30 tablet   2   . ferrous fumarate (FERRO-SEQUELS) 50 MG CR tablet   Oral   Take 50 mg by mouth daily.         . fluticasone (FLONASE) 50 MCG/ACT nasal spray   Nasal   Place 2 sprays into the nose daily.   16 g   2   . furosemide (LASIX) 40 MG tablet      1 and 1/2 tablets daily         . GLUCAGON EMERGENCY 1 MG injection               . insulin glulisine (APIDRA) 100 UNIT/ML injection   Subcutaneous   Inject into the skin daily. Sliding scale (insulin pump)         . isosorbide mononitrate (IMDUR) 30 MG 24 hr tablet      1/2 tablet (total 50m) daily   15 tablet   6   . Krill Oil Omega-3 300 MG CAPS   Oral   Take 1 capsule by mouth daily.         . metoprolol (LOPRESSOR)  50 MG tablet   Oral   Take 50 mg by mouth 2 (two) times daily as needed. Take 25 am and 50 pm         . Multiple Vitamins-Minerals (MULTIVITAMIN WITH MINERALS) tablet   Oral   Take 1 tablet by mouth daily.           .Marland KitchenNITROLINGUAL 0.4 MG/SPRAY spray      USE ONE SPRAY UNDER TONGUE EVERY 5 MINUTES AS NEEDED FOR CHEST PAIN. MAY REPEAT THREE TIMES   4.9 g   3   . omeprazole (PRILOSEC OTC) 20 MG tablet   Oral   Take 20 mg by mouth daily.         . polyethylene glycol powder (GLYCOLAX/MIRALAX) powder   Oral   Take 17 g by mouth daily. 1/2 - 1 dose every day.         . potassium chloride SA (K-DUR,KLOR-CON) 20 MEQ tablet   Oral   Take 40 mEq by mouth daily.         . pravastatin (PRAVACHOL) 10 MG tablet      TAKE ONE (1) TABLET BY MOUTH EVERY DAY   90 tablet   3   . salmeterol (SEREVENT DISKUS) 50 MCG/DOSE diskus inhaler   Inhalation   Inhale 2 puffs into the lungs 2 (two) times daily.   1 Inhaler   12   . vitamin C (ASCORBIC ACID)  500 MG tablet   Oral   Take 500 mg by mouth daily.          BP 144/50  Pulse 86  Temp(Src) 97.5 F (36.4 C) (Oral)  Resp 16  SpO2 100% Physical Exam  Nursing note and vitals reviewed. Constitutional: She is oriented to person, place, and time. She appears well-developed and well-nourished. No distress.  HENT:  Head: Normocephalic and atraumatic.  Right Ear: External ear normal.  Left Ear: External ear normal.  Nose: Nose normal.  Mouth/Throat: Oropharynx is clear and moist.  Eyes: Conjunctivae are normal.  Neck: Normal range of motion.  Cardiovascular: Normal rate, regular rhythm, intact distal pulses and normal pulses.   Murmur heard. Pulmonary/Chest: Effort normal and breath sounds normal. No stridor. No respiratory distress. She has no wheezes. She has no rales.  Abdominal: Soft. She exhibits no distension.  Musculoskeletal: Normal range of motion.  Neurological: She is alert and oriented to person, place, and time.  She has normal strength.  Skin: Skin is warm and dry. She is not diaphoretic. No erythema.  Psychiatric: She has a normal mood and affect. Her behavior is normal.    ED Course  Procedures (including critical care time) Labs Review Labs Reviewed  CBC  COMPREHENSIVE METABOLIC PANEL  TYPE AND SCREEN   7:05 PM Discussed case with Nicole Kindred, the PA from [place] [facility]. He has already put admission orders in and scheduled a transfusion.   Imaging Review No results found.  MDM  No diagnosis found. Patient presents after having an abnormal lab at her cardiologist's office prior to a cardiac cath on Monday. Found to be anemic with hemoglobin of 7.3. No new melena, shortness of breath. Nicole Kindred from [facility] has already put orders in for admission and transfusion. This admission is appreciated. Vital signs are stable for transfer to floor. Patient has no complaints at this time. Labs drawn in ED are pending.     Elwyn Lade, PA-C 04/03/13 [phone]

## 2013-04-03 NOTE — Patient Instructions (Addendum)
Decrease lasix (furosmide) to 9m daily. This will be 1 and 1/2 of a 451mtablet daily. DO NOT TAKE LASIX Monday.  Start Imdur (isosorbide)1554maily. This will be 1/2 of a 34m61mblet daily.   Your physician has requested that you have a cardiac catheterization. Cardiac catheterization is used to diagnose and/or treat various heart conditions. Doctors may recommend this procedure for a number of different reasons. The most common reason is to evaluate chest pain. Chest pain can be a symptom of coronary artery disease (CAD), and cardiac catheterization can show whether plaque is narrowing or blocking your heart's arteries. This procedure is also used to evaluate the valves, as well as measure the blood flow and oxygen levels in different parts of your heart. For further information please visit [email or web address] follow instruction sheet, as given. Monday September 15,2014   Your physician recommends that you have  lab work today--BMET/CBCD/PT/INR

## 2013-04-03 NOTE — ED Notes (Signed)
Attempted to call report, nurse that is getting pt still in huddle. Will call back when she is done.

## 2013-04-03 NOTE — ED Notes (Addendum)
Dr Claris Gladden RN called pt saying that her Hgb was 7.3; pt has cath scheduled for Monday ; was sent here for labs and maybe blood transfusion; reports in July had 3 days of rectal bleeding but stopped on its own but nothing recently; reports diarrhea for past 2 days; pt does report chest tightness

## 2013-04-04 DIAGNOSIS — Z86010 Personal history of colonic polyps: Secondary | ICD-10-CM

## 2013-04-04 DIAGNOSIS — E119 Type 2 diabetes mellitus without complications: Secondary | ICD-10-CM

## 2013-04-04 DIAGNOSIS — R195 Other fecal abnormalities: Secondary | ICD-10-CM

## 2013-04-04 DIAGNOSIS — K219 Gastro-esophageal reflux disease without esophagitis: Secondary | ICD-10-CM

## 2013-04-04 DIAGNOSIS — I509 Heart failure, unspecified: Secondary | ICD-10-CM

## 2013-04-04 DIAGNOSIS — I2510 Atherosclerotic heart disease of native coronary artery without angina pectoris: Secondary | ICD-10-CM

## 2013-04-04 DIAGNOSIS — D649 Anemia, unspecified: Secondary | ICD-10-CM

## 2013-04-04 DIAGNOSIS — I7090 Unspecified atherosclerosis: Secondary | ICD-10-CM

## 2013-04-04 DIAGNOSIS — K559 Vascular disorder of intestine, unspecified: Secondary | ICD-10-CM

## 2013-04-04 DIAGNOSIS — I25810 Atherosclerosis of coronary artery bypass graft(s) without angina pectoris: Secondary | ICD-10-CM

## 2013-04-04 DIAGNOSIS — E785 Hyperlipidemia, unspecified: Secondary | ICD-10-CM

## 2013-04-04 DIAGNOSIS — I10 Essential (primary) hypertension: Secondary | ICD-10-CM

## 2013-04-04 DIAGNOSIS — Z950 Presence of cardiac pacemaker: Secondary | ICD-10-CM

## 2013-04-04 DIAGNOSIS — I5032 Chronic diastolic (congestive) heart failure: Secondary | ICD-10-CM

## 2013-04-04 LAB — TYPE AND SCREEN
Antibody Screen: NEGATIVE
Unit division: 0

## 2013-04-04 LAB — CBC
HCT: 28.5 % — ABNORMAL LOW (ref 36.0–46.0)
Hemoglobin: 9 g/dL — ABNORMAL LOW (ref 12.0–15.0)
MCV: 78.9 fL (ref 78.0–100.0)
RBC: 3.61 MIL/uL — ABNORMAL LOW (ref 3.87–5.11)
RDW: 16.2 % — ABNORMAL HIGH (ref 11.5–15.5)
WBC: 7.2 10*3/uL (ref 4.0–10.5)

## 2013-04-04 LAB — GLUCOSE, CAPILLARY
Glucose-Capillary: 177 mg/dL — ABNORMAL HIGH (ref 70–99)
Glucose-Capillary: 126 mg/dL — ABNORMAL HIGH (ref 70–99)
Glucose-Capillary: 168 mg/dL — ABNORMAL HIGH (ref 70–99)
Glucose-Capillary: 199 mg/dL — ABNORMAL HIGH (ref 70–99)

## 2013-04-04 LAB — BASIC METABOLIC PANEL
CO2: 27 mEq/L (ref 19–32)
Calcium: 10.3 mg/dL (ref 8.4–10.5)
Glucose, Bld: 182 mg/dL — ABNORMAL HIGH (ref 70–99)
Potassium: 3.9 mEq/L (ref 3.5–5.1)
Sodium: 139 mEq/L (ref 135–145)

## 2013-04-04 LAB — RETICULOCYTES
RBC.: 3.67 MIL/uL — ABNORMAL LOW (ref 3.87–5.11)
Retic Count, Absolute: 91.8 10*3/uL (ref 19.0–186.0)

## 2013-04-04 LAB — HEMOGLOBIN AND HEMATOCRIT, BLOOD: HCT: 29 % — ABNORMAL LOW (ref 36.0–46.0)

## 2013-04-04 LAB — LIPID PANEL
Cholesterol: 151 mg/dL (ref 0–200)
Total CHOL/HDL Ratio: 5.2 RATIO

## 2013-04-04 LAB — FOLATE: Folate: >20 ng/mL

## 2013-04-04 LAB — TROPONIN I: Troponin I: <0.3 ng/mL (ref <0.30)

## 2013-04-04 LAB — IRON AND TIBC: UIBC: 563 ug/dL — ABNORMAL HIGH (ref 125–400)

## 2013-04-04 LAB — FERRITIN: Ferritin: 10 ng/mL (ref 10–291)

## 2013-04-04 LAB — HEMOGLOBIN A1C: Mean Plasma Glucose: 134 mg/dL — ABNORMAL HIGH (ref <117)

## 2013-04-04 LAB — T4, FREE: Free T4: 1.14 ng/dL (ref 0.80–1.80)

## 2013-04-04 MED ORDER — INSULIN PUMP
Freq: Three times a day (TID) | SUBCUTANEOUS | Status: DC
  Administered 2013-04-04 – 2013-04-05 (×7): via SUBCUTANEOUS
  Filled 2013-04-04: qty 1

## 2013-04-04 MED ORDER — ISOSORBIDE MONONITRATE 15 MG HALF TABLET
15.0000 mg | ORAL_TABLET | Freq: Once | ORAL | Status: AC
  Administered 2013-04-04: 15 mg via ORAL
  Filled 2013-04-04: qty 1

## 2013-04-04 MED ORDER — PANTOPRAZOLE SODIUM 40 MG PO TBEC
40.0000 mg | DELAYED_RELEASE_TABLET | Freq: Every day | ORAL | Status: DC
  Administered 2013-04-04 – 2013-04-10 (×7): 40 mg via ORAL
  Filled 2013-04-04 (×6): qty 1

## 2013-04-04 NOTE — ED Provider Notes (Signed)
Medical screening examination/treatment/procedure(s) were conducted as a shared visit with non-physician practitioner(s) and myself.  I personally evaluated the patient during the encounter  Patient here for blood transfusion. Accepted by Cards. AFVSS, relaxing comfortably. Admitted to Cards.  Osvaldo Shipper, MD 04/04/13 [phone]

## 2013-04-04 NOTE — Progress Notes (Addendum)
Paged by Lonzo Cloud, RN this morning.  Upon call to [phone], was informed that patient was admitted and she was using an insulin pump with Apidra insulin.  Instructed RN on which forms to use and proper documentation for insulin pump therapy as an inpatient.  Then spoke with patient over the phone regarding diabetes and regimen for diabetes control.  Patient reports that she has had diabetes for 14 years and she has been on an insulin pump for the past 7 years.  Patient reports that she was working with Dr. Elyse Hsu for diabetes management.  Over the past year, she reports her blood sugar has had wide fluctuations from 40's to 300's mg/dl.  She was referred to Dr. Arnette Schaumann (Endocrinologist at [facility]; personal cell phone [phone]).   She has been calling Dr. Cathren Harsh almost daily for insulin adjustments via insulin pump setting due to frequent morning low blood sugars.  Patient reports that she has been changing insulin pump setting daily as advised by Dr. Cathren Harsh and she last made changes yesterday.  However, this morning her fasting blood glucose was 35 mg/dl at 5:09.    Results for [name], [name] (MRN [id number]) as of 04/04/2013 10:47  Ref. Range 04/03/2013 21:17 04/04/2013 05:09 04/04/2013 07:00  Glucose-Capillary Latest Range: 70-99 mg/dL 96 35 (LL) 177 (H)   Patient also reports that she has an allergy to Novolog insulin which causes severe itching (rated 7/10).  She also states that she has some itching (rated 4/10) with current insulin Apidra used in her insulin pump.  She states that she saw an allergy specialist this past Tuesday and was told that she is allergic to a component in insulin.  She is awaiting further results from the allergy specialist.    Patient expresses concern for inpatient glycemic control for and during planned Cardiac Cath on Monday or Tuesday.  I plan to call Dr. Cathren Harsh between 2pm and 5pm today (that is when patient was told she would be available) on her personal  cell number [phone].    Patient has Apidria in her insulin pump and she states that she has 1 1/2 bottles of Apidria with her at the hospital along with insulin pump supplies.  She has been advised to keep Apidria at hospital since it is not a formulary used for inpatient insulin therapy.    Current basal settings on her insulin pump:  12 midnight - 11 am  2.5 units/hour 11 am - 1 pm    4.2 units/hour 1 pm -5:30 pm  4.3 units/hour 5:30 pm - 12 midnight  4.5 units/hour  Current sensitivity: 1 unit:5 mg/dl (1 unit drops her blood glucose 5 mg/dl) Current carb ratio: 1 unit:2 grams carbohydrates (1 unit covers 2 grams of carbohydrates)  Patient reports that she was hospitalized in 2011 for cardiac issues.  During that hospitalization she was placed on Novolin R (Regular insulin) during cardiac procedure and was transitioned to Lantus and Novolog.  Due to the severe itching associated with Novolog, patient does not want to be placed on Novolog.  Plan to discuss her plan of care with Dr. Cathren Harsh this afternoon and ask for inpatient recommendations.  Once I have talked with DR. Stratt, I will notify nursing and physician regarding recommendations.   04/04/13@11 :09 - Paged Dr. Bronson Ing to inform him I plan to call Dr. Cathren Harsh this afternoon regarding recommendations on plan for inpatient glycemic control. Once I speak with Dr. Cathren Harsh I will update note, MD, and Nursing on plan.  Thanks, Barnie Alderman, RN, MSN, CCRN Diabetes Coordinator Inpatient Diabetes Program [phone] (Team Pager) [phone] ([place] office) [phone] [facility] office)

## 2013-04-04 NOTE — Progress Notes (Signed)
RN in with pt. To have her sign insulin pump therapy contract. Pt. Notified that on call MD wants her to turn insulin pump off at this time. On coming MD will see patient about insulin pump therapy this am. RN will also notify diabetes coordinator.

## 2013-04-04 NOTE — Progress Notes (Addendum)
Inpatient Diabetes Program Recommendations  AACE/ADA: New Consensus Statement on Inpatient Glycemic Control (2013)  Target Ranges:  Prepandial:   less than 140 mg/dL      Peak postprandial:   less than 180 mg/dL (1-2 hours)      Critically ill patients:  140 - 180 mg/dL     Inpatient Diabetes Program Recommendations Insulin - Basal: Please have patient change basal rate : 12 midnight to 11 am to 1.5 units/hour (per recommendation of Dr. Cathren Harsh).  Note: Spoke with Dr. Cathren Harsh [facility] Endocrinologist) regarding patient and recommendations for inpatient glycemic control.  Patient had low blood glucoe of 35 mg/dl this morning at 5:09.  Dr. Cathren Harsh states she has been working with the patient and each day this week she has been adjusting basal rates to prevent noted morning hypoglycemia.  Dr. Cathren Harsh recommends that the basal rate from 12 midnight to 11 am be decreased to 1.5 units/hr.  Will page Dr. Bronson Ing to request order for change and update on recommendations of Dr. Cathren Harsh.    Dr. Cathren Harsh advises to use Novolin R IV when patient has heart catheterization on Monday or Tuesday. Once the procedure is over and patient is alert and able to use her insulin pump the insulin pump can be resumed.  Please page Diabetes Coordinator with any additional issues or concerns.   Thanks, Barnie Alderman, RN, MSN, CCRN Diabetes Coordinator Inpatient Diabetes Program [phone] (Team Pager) [phone] ([place] office) [phone] [facility] office)  9/13@2 :45 pm - Spoke with Bunnie Domino, NP and received telephone order to change basal rate as recommended by Dr. Cathren Harsh and to change insulin from Novolog to Apidra since the patient is using Apidra in her insulin pump.  9/13@2 :55 pm - Spoke with patient over the phone and had her change her basal rate during 12 midnight to 11 am from 2.5 units/day to 1.5 units/day.  Patient informed of conversation with Dr. Cathren Harsh and recommendations to use IV Regular insulin during  cardiac cath.

## 2013-04-04 NOTE — Consult Note (Addendum)
Referring Provider: [facility] Primary Care Physician:  Leonides Grills, MD Primary Gastroenterologist:  Dr. [name]  Reason for Consultation:  Anemia with recent drop in hgb  HPI: [name] [name] is a 62 y.o. female with multiple medical problems including insulin-dependent diabetes mellitus for which she has an insulin pump, remote history of Hodgkin's disease with chemotherapy and radiation, history of renal cell CA status post right nephrectomy, gastroparesis, Karlene Lineman, coronary artery disease status post CABG in 2005 and aortic valve replacement approximately 3 years ago. She has sick sinus syndrome and is status post pacemaker and also has chronic congestive heart failure class III. Patient has history of ischemic colitis and also adenomatous colon polyps. She last had the EGD in December of 2013 was found to have 6-7 mm antral polyp which was biopsied and was hyperplastic. She had colonoscopy at that same time with no recurrent polyps she did have mild sigmoid diverticulosis. Patient was admitted to the hospital yesterday from the cardiology office. She was seen in followup after a stress test which was abnormal about a week ago. This did show ischemic changes in patient had chest pain with the exam she had been complaining of shortness of breath with exertion over the past 3 or 4 months which have been somewhat progressive.  Interestingly she had an episode of rectal bleeding the last weekend of June. She says she did have significant abdominal pain with this which was very similar to her previous episode of ischemic colitis. She says she was passing about a cup of blood at the time and that she bled over [place] 24 hour period and then the bleeding gradually eased off. She had called and spoken with Dr. Henrene Pastor at that time was not having any problems with lightheadedness dizziness etc. She is maintained on a baby aspirin no other blood thinners . Her hemoglobin was checked on July 2 and was  11, MCV was 76 she had not had any labs in the interim labs were drawn yesterday because of the complaint of shortness of breath and she was noticed to have a hemoglobin of 7.3  She says other than that 1 week and with rectal bleeding at the end of June she has not noticed any melena or hematochezia, no changes in her bowel habits with intermittent constipation. She's not been having any abdominal crit has been fair. She does have gastroparesis. No nausea or vomiting. Plan is for cardiac catheter this admission, on Tuesday and we are asked to evaluate from a GI standpoint. She has been transfused 2 units of blood in her hemoglobin is 9.1 today, iron studies are pending.   Past Medical History  Diagnosis Date  . Aortic stenosis      bicuspid aortic valve; mean gradient of 20 mmHg in 2/10; aortic valve   replacement surgery(19-mm Edwards pericardial valve) with a redo coronary artery bypass graft procedure      in 07/2009; postoperative Dressler's syndrome    . Coronary artery disease     status post RCA saphenous vein graft in 06/1999; multiple preceding PCIs with restenosis; total obstruction of RCA in 2006 with patent graft   . CHF (congestive heart failure)     History of CHF; recurred following AVR/CABG surgery; intermittent PND  . Hyperlipidemia   . Hypertension   . Small bowel obstruction     Recurrent; resolved after resection of a lipoma  . Osteopenia      hip on DEXA in October 2007.  Marland Kitchen Anemia   .  Carcinoma, renal cell 05/2007    Laparoscopic right nephrectomy  . NASH (nonalcoholic steatohepatitis) 1999    -biopsy in 1999  . Diabetes mellitus 03/2010     TYPE II: Hemoglobin A1c of 7.4 in  03/2010; 8.4 in 06/2010; treated with insulin pump  . Migraines   . Duodenal ulcer     Remote; H. pylori positive  . Colitis, ischemic 2008  . Gastroesophageal reflux disease     ? of GASTROPARESIS; IRRITABLE BOWEL SYNDROME  . Hodgkin's disease 1991    M[place]le radiation therapy  . Exertional  dyspnea     chest discomfort  . Statin intolerance   . Hypercalcemia     Ca-11.1 in 07/2011  . Vitamin D deficiency   . Costochondritis   . Insomnia   . Anemia   . Gastroparesis due to DM 05/01/2012  . PONV (postoperative nausea and vomiting)   . Hyperplastic gastric polyp 06/23/2012  . Arthritis   . Heart murmur   . Osteoporosis   . Pacemaker 08/21/2012    Medtronic Adapta L dual-chamber pacemaker-Dr. Lovena Le  . Anxiety     Past Surgical History  Procedure Laterality Date  . Total abdominal hysterectomy w/ bilateral salpingoophorectomy  2000  . Breast excisional biopsy      benign, bilateral  . Nephrectomy  2008    Laparoscopic right nephrectomy, renal cell carcinoma  . Coronary artery bypass graft  2005, 2011    CABG-most recent SVG to RCA-12/05; RCA occluded with patent graft in 2006; Redo bypass in 07/2009  . Tumor excision  1981    removal of Hodgkins lymphoma  . Exploratory laparotomy w/ bowel resection      Small bowel resection of lipoma  . Aortic valve replacement  2011    Aortic valve replacement surgery with a 19 mm bioprosthetic device post redo CABG January 2011.  . Colonoscopy      Multiple with adenomatous polyps  . Cervical biopsy    . Liver biopsy    . Bone marrow biopsy    . Esophagogastroduodenoscopy    . Esophagogastroduodenoscopy  06/23/2012    Procedure: ESOPHAGOGASTRODUODENOSCOPY (EGD);  Surgeon: Gatha Mayer, MD;  Location: [facility];  Service: Endoscopy;  Laterality: N/A;  . Colonoscopy  06/23/2012    Procedure: COLONOSCOPY;  Surgeon: Gatha Mayer, MD;  Location: [facility];  Service: Endoscopy;  Laterality: N/A;  [name] dilation  06/23/2012    Procedure: [name] DILATION;  Surgeon: Gatha Mayer, MD;  Location: [facility];  Service: Endoscopy;;  56 fr  . Pacemaker insertion  08/21/2012    Medtronic Adapta L dual-chamber pacemaker    Prior to Admission medications   Medication Sig Start Date End Date Taking? Authorizing Provider   ALPRAZolam (XANAX) 0.25 MG tablet Take 1 tablet (0.25 mg total) by mouth at bedtime. 08/04/12  Yes Yehuda Savannah, MD  aspirin EC 81 MG tablet Take 81 mg by mouth daily.   Yes Historical Provider, MD  Calcium Carbonate-Vitamin D (RA CALCIUM PLUS VITAMIN D) 600-400 MG-UNIT per tablet Take 1 tablet by mouth 2 (two) times daily.    Yes Historical Provider, MD  cholecalciferol (VITAMIN D) 1000 UNITS tablet Take 1,000 Units by mouth daily.     Yes Historical Provider, MD  ezetimibe (ZETIA) 10 MG tablet Take 1 tablet (10 mg total) by mouth daily. 05/05/12  Yes Yehuda Savannah, MD  fenofibrate (TRICOR) 145 MG tablet Take 145 mg by mouth at bedtime.   Yes Historical Provider, MD  fluticasone (FLONASE) 50 MCG/ACT nasal spray Place 2 sprays into the nose daily as needed for rhinitis. For nasal congestion   Yes Historical Provider, MD  furosemide (LASIX) 40 MG tablet Take 60 mg by mouth daily.   Yes Historical Provider, MD  GLUCAGON EMERGENCY 1 MG injection  03/13/13  Yes Historical Provider, MD  Insulin Human (INSULIN PUMP) 100 unit/ml SOLN Inject into the skin continuous. Apidra Basal Rate: Midnight-1100=2.5 1100-1300=4.2 1300-1700=4.3 1700-midnight=4.5   Yes Historical Provider, MD  isosorbide mononitrate (IMDUR) 30 MG 24 hr tablet Take 15 mg by mouth daily.   Yes Historical Provider, MD  metoprolol (LOPRESSOR) 50 MG tablet Take 50 mg by mouth 2 (two) times daily. Takes 10m in the am & 575min the pm   Yes Historical Provider, MD  Multiple Vitamins-Minerals (MULTIVITAMIN WITH MINERALS) tablet Take 1 tablet by mouth daily.     Yes Historical Provider, MD  omeprazole (PRILOSEC OTC) 20 MG tablet Take 20 mg by mouth daily.   Yes Historical Provider, MD  polyethylene glycol (MIRALAX / GLYCOLAX) packet Take 8.5 g by mouth daily as needed. For constipation   Yes Historical Provider, MD  potassium chloride SA (K-DUR,KLOR-CON) 20 MEQ tablet Take 40 mEq by mouth daily.   Yes Historical Provider, MD   pravastatin (PRAVACHOL) 10 MG tablet Take 10 mg by mouth at bedtime.   Yes Historical Provider, MD  desoximetasone (TOPICORT) 0.05 % cream Apply 1 application topically 2 (two) times daily as needed. When skin starts peeling    Historical Provider, MD  NITROLINGUAL 0.4 MG/SPRAY spray USE ONE SPRAY UNDER TONGUE EVERY 5 MINUTES AS NEEDED FOR CHEST PAIN. MAY REPEAT THREE TIMES 10/19/12   KaLendon ColonelNP    Current Facility-Administered Medications  Medication Dose Route Frequency Provider Last Rate Last Dose  . 0.9 %  sodium chloride infusion  250 mL Intravenous PRN Roger A Arguello, PA-C      . acetaminophen (TYLENOL) tablet 650 mg  650 mg Oral Q4H PRN Roger A Arguello, PA-C      . ALPRAZolam (XDuanne Morontablet 0.25 mg  0.25 mg Oral QHS Roger A Arguello, PA-C   0.25 mg at 04/03/13 2344  . aspirin EC tablet 81 mg  81 mg Oral Daily Roger A Arguello, PA-C   81 mg at 04/04/13 1001  . calcium-vitamin D (OSCAL WITH D) 500-200 MG-UNIT per tablet 1 tablet  1 tablet Oral BID PeJosue HectorMD   1 tablet at 04/04/13 1000  . cholecalciferol (VITAMIN D) tablet 1,000 Units  1,000 Units Oral Daily Roger A Arguello, PA-C   1,000 Units at 04/04/13 1000  . ferrous sulfate tablet 325 mg  325 mg Oral Q breakfast PeJosue HectorMD   325 mg at 04/04/13 0701   And  . docusate sodium (COLACE) capsule 100 mg  100 mg Oral Daily PeJosue HectorMD   100 mg at 04/04/13 1000  . ezetimibe (ZETIA) tablet 10 mg  10 mg Oral Daily Roger A Arguello, PA-C   10 mg at 04/03/13 2325  . fenofibrate tablet 160 mg  160 mg Oral Daily Roger A Arguello, PA-C   160 mg at 04/03/13 2323  . fluticasone (FLONASE) 50 MCG/ACT nasal spray 2 spray  2 spray Each Nare Daily Roger A Arguello, PA-C   2 spray at 04/04/13 1002  . furosemide (LASIX) tablet 60 mg  60 mg Oral Daily Roger A Arguello, PA-C   60 mg at 04/04/13 1001  . insulin aspart (  novoLOG) injection 0-15 Units  0-15 Units Subcutaneous TID WC Roger A Arguello, PA-C      . isosorbide  mononitrate (IMDUR) 24 hr tablet 15 mg  15 mg Oral Daily Roger A Arguello, PA-C   15 mg at 04/03/13 2323  . metoprolol (LOPRESSOR) tablet 50 mg  50 mg Oral QHS Josue Hector, MD   50 mg at 04/03/13 2324  . metoprolol tartrate (LOPRESSOR) tablet 25 mg  25 mg Oral Daily Josue Hector, MD   25 mg at 04/04/13 1001  . nitroGLYCERIN (NITROSTAT) SL tablet 0.4 mg  0.4 mg Sublingual Q5 Min x 3 PRN Roger A Arguello, PA-C      . ondansetron (ZOFRAN) injection 4 mg  4 mg Intravenous Q6H PRN Roger A Arguello, PA-C   4 mg at 04/04/13 1028  . polyethylene glycol (MIRALAX / GLYCOLAX) packet 17 g  17 g Oral Daily Josue Hector, MD   17 g at 04/04/13 1001  . potassium chloride SA (K-DUR,KLOR-CON) CR tablet 40 mEq  40 mEq Oral Daily Roger A Arguello, PA-C   40 mEq at 04/04/13 1001  . pravastatin (PRAVACHOL) tablet 10 mg  10 mg Oral Daily Josue Hector, MD   10 mg at 04/04/13 0003  . sodium chloride 0.9 % injection 3 mL  3 mL Intravenous Q12H Roger A Arguello, PA-C   3 mL at 04/04/13 1028  . sodium chloride 0.9 % injection 3 mL  3 mL Intravenous PRN Roger A Arguello, PA-C      . triamcinolone (KENALOG) 1.572 % cream 1 application  1 application Topical PRN Roger A Arguello, PA-C        Allergies as of 04/03/2013 - Review Complete 04/03/2013  Allergen Reaction Noted  . Clindamycin/lincomycin Anaphylaxis and Shortness Of Breath 12/20/2010  . Penicillins Anaphylaxis, Hives, and Rash 05/20/2008  . Gentamycin [gentamicin] Hives, Itching, and Rash 12/21/2011  . Tizanidine Other (See Comments) 12/02/2012  . Atorvastatin Other (See Comments) 05/20/2008  . Avandia [rosiglitazone maleate]  05/20/2008  . Codeine Nausea And Vomiting 05/20/2008  . Crestor [rosuvastatin] Other (See Comments) 04/03/2013  . Erythromycin Nausea And Vomiting 05/20/2008  . Metformin  05/20/2008  . Pravastatin Other (See Comments) 04/03/2013  . Simvastatin  05/20/2008  . Cephalexin Swelling and Rash 05/20/2008  . Iodinated diagnostic agents  Other (See Comments) 07/20/2011  . Neosporin [neomycin-bacitracin zn-polymyx] Rash 06/17/2012    Family History  Problem Relation Age of Onset  . Heart attack Mother   . Heart disease Mother   . Heart attack Father   . Diabetes Father   . Heart disease Father   . Hypertension Brother   . Glaucoma Brother   . Lung cancer Sister     meta  . Cancer Sister     breast - half sister (Paternal)  . Ovarian cancer Sister   . Cancer Sister     cervical and lung - half sister (maternal)  . Breast cancer Sister   . Colon cancer Neg Hx   . Stomach cancer Neg Hx     History   Social History  . Marital Status: Married    Spouse Name: N/A    Number of Children: 2  . Years of Education: N/A   Occupational History  . full time     elementary principal   Social History Main Topics  . Smoking status: Never Smoker   . Smokeless tobacco: Never Used  . Alcohol Use: No  . Drug Use: No  .  Sexual Activity: Yes   Other Topics Concern  . 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    Review of Systems: Pertinent positive and negative review of systems were noted in the above HPI section.  All other review of systems was otherwise negative.Marland Kitchen  Physical Exam: Vital signs in last 24 hours: Temp:  [97 F (36.1 C)-98.5 F (36.9 C)] 97.1 F (36.2 C) (09/13 3267) Pulse Rate:  [80-98] 88 (09/13 1001) Resp:  [10-20] 20 (09/13 0932) BP: (102-144)/(38-77) 111/59 mmHg (09/13 1001) SpO2:  [97 %-100 %] 97 % (09/13 0932) Weight:  [121 lb 4.1 oz (55 kg)-126 lb (57.153 kg)] 121 lb 7.6 oz (55.1 kg) (09/13 1245) Last BM Date: 04/02/13 General:   Alert,  Well-developed, well-nourished,WF  pleasant and cooperative in NAD Head:  Normocephalic and atraumatic. Eyes:  Sclera clear, no icterus.   Conjunctiva pink. Ears:  Normal auditory acuity. Nose:  No deformity, discharge,  or lesions. Mouth:  No deformity or lesions.   Neck:  Supple; no  masses or thyromegaly. Lungs:  Clear throughout to auscultation.   No wheezes, crackles, or rhonchi.  Heart:  Regular rate and rhythm;+ murmur soft, sternal scar Abdomen:  Soft,nontender, BS active,nonpalp mass or hsm.  Insulin pump Rectal: brown trace heme positive stool Msk:  Symmetrical without gross deformities. . Pulses:  Normal pulses noted. Extremities:  Without clubbing or edema. Neurologic:  Alert and  oriented x4;  grossly normal neurologically. Skin:  Intact without significant lesions or rashes.. Psych:  Alert and cooperative. Normal mood and affect.  Intake/Output from previous day: 09/12 0701 - 09/13 0700 In: 826.7 [P.O.:480; Blood:346.7] Out: 1450 [Urine:1450] Intake/Output this shift: Total I/O In: 243 [P.O.:240; I.V.:3] Out: -   Lab Results:  Recent Labs  04/03/13 1501 04/03/13 1805 04/04/13 0345  WBC 7.5 8.3 7.2  HGB 7.3 Repeated and verified X2.* 8.4* 9.0*  9.1*  HCT 23.4 Repeated and verified X2.* 27.7* 28.5*  29.0*  PLT 218.0 248 244   BMET  Recent Labs  04/03/13 1501 04/03/13 1805  NA 137 141  K 3.8 4.0  CL 99 99  CO2 30 27  GLUCOSE 144* 196*  BUN 34* 36*  CREATININE 1.6* 1.44*  CALCIUM 10.0 11.1*   LFT  Recent Labs  04/03/13 1805  PROT 8.9*  ALBUMIN 4.5  AST 39*  ALT 18  ALKPHOS 68  BILITOT 0.3   PT/INR  Recent Labs  04/03/13 1501  LABPROT 13.2*  INR 1.3*    IMPRESSION:  #39 62 year old female with 4 g drop in hemoglobin over the past 2 months, no obvious bleeding however she is Hemoccult positive . Etiology of the hemoglobin drop is not clear, suspect she may have had some ongoing slow GI blood loss, rule out gastropathy, ulcer disease, celiac disease, or AVMs Recent colonoscopy negative with exception of diverticulosis and EGD positive only for one very small hyperplastic gastric polyp. #2 history of ischemic colitis-patient probably had another episode of ischemic colitis June 2014 however this bleeding occurred prior  to the hemoglobin of 11 on 01/21/2013 #3 insulin-dependent diabetes mellitus-insulin pump #4 diabetic gastroparesis #5 sick sinus syndrome status post pacemaker #6 coronary artery disease status post CABG 2005-abnormal stress test-plan for cardiac cath this admission/may need intervention #7 congestive heart failure-class III #8 remote history of Hodgkin's disease with radiation #9 history of renal cell CA status post right nephrectomy 2008 #10  NASH #11 chronic GERD  PLAN: #1 daily PPI #2 serial hemoglobins and transfuse to keep hemoglobin in the 9 range given underlying coronary artery disease #3 await iron studies and treat as indicated #4 Will plan for EGD on Monday with Dr. Henrene Pastor to include small bowel biopsies to rule out celiac disease If EGD is unremarkable she may need capsule endoscopy if more obvious GI bleeding occurs. Generally this is done outpatient however she has history of diabetic gastroparesis and a pacemaker. Pacemakers require cardiac monitoring during capsule endoscopy and therefore would pursue as an inpatient probably after her cardiac cath.   Amy Esterwood  04/04/2013, 12:47 PM  GI ATTENDING  History, labs, x-rays, previous endoscopy reports reviewed. Patient seen and examined. Agree with H&P as outlined above. Patient known to me from prior admissions. Admitted with SOB and recent abnormal stress test. Found to have significant interval anemia without an obvious GI bleed. Stool is heme +. Agree with transfusion to clinically appropriate level and plans for EGD/bx Monday. May be reasonable to pursue capsule endoscopy as well, if EGD/bx negative. Thanks.   Docia Chuck. Geri Seminole., M.D. [facility] Division of Gastroenterology

## 2013-04-04 NOTE — Progress Notes (Signed)
Care of pt assumed at this time.  Agree with previously charted assessment.  Pt denies questions, concerns or needs at this time.  Will monitor.  Coolidge Breeze, RN 04/04/2013

## 2013-04-04 NOTE — Progress Notes (Addendum)
SUBJECTIVE: Pt had some mild chest discomfort, nausea, and headache during her blood transfusion, but has otherwise felt well. She says she has a h/o ischemic colitis as well as a gastric polyp, but has not noticed any recent rectal bleeding. She has complex diabetes management which is being done by an endocrinologist at [facility]. She's had several high and low blood sugar readings over the past week. She has tolerated the lower dose of Imdur.     Intake/Output Summary (Last 24 hours) at 04/04/13 1136 Last data filed at 04/04/13 1028  Gross per 24 hour  Intake 1069.67 ml  Output   1450 ml  Net -380.33 ml    Current Facility-Administered Medications  Medication Dose Route Frequency Provider Last Rate Last Dose  . 0.9 %  sodium chloride infusion  250 mL Intravenous PRN Roger A Arguello, PA-C      . acetaminophen (TYLENOL) tablet 650 mg  650 mg Oral Q4H PRN Roger A Arguello, PA-C      . ALPRAZolam Duanne Moron) tablet 0.25 mg  0.25 mg Oral QHS Roger A Arguello, PA-C   0.25 mg at 04/03/13 2344  . aspirin EC tablet 81 mg  81 mg Oral Daily Roger A Arguello, PA-C   81 mg at 04/04/13 1001  . calcium-vitamin D (OSCAL WITH D) 500-200 MG-UNIT per tablet 1 tablet  1 tablet Oral BID Josue Hector, MD   1 tablet at 04/04/13 1000  . cholecalciferol (VITAMIN D) tablet 1,000 Units  1,000 Units Oral Daily Roger A Arguello, PA-C   1,000 Units at 04/04/13 1000  . ferrous sulfate tablet 325 mg  325 mg Oral Q breakfast Josue Hector, MD   325 mg at 04/04/13 0701   And  . docusate sodium (COLACE) capsule 100 mg  100 mg Oral Daily Josue Hector, MD   100 mg at 04/04/13 1000  . ezetimibe (ZETIA) tablet 10 mg  10 mg Oral Daily Roger A Arguello, PA-C   10 mg at 04/03/13 2325  . fenofibrate tablet 160 mg  160 mg Oral Daily Roger A Arguello, PA-C   160 mg at 04/03/13 2323  . fluticasone (FLONASE) 50 MCG/ACT nasal spray 2 spray  2 spray Each Nare Daily Roger A Arguello, PA-C   2 spray at 04/04/13 1002  . furosemide  (LASIX) tablet 60 mg  60 mg Oral Daily Roger A Arguello, PA-C   60 mg at 04/04/13 1001  . insulin aspart (novoLOG) injection 0-15 Units  0-15 Units Subcutaneous TID WC Roger A Arguello, PA-C      . isosorbide mononitrate (IMDUR) 24 hr tablet 15 mg  15 mg Oral Daily Roger A Arguello, PA-C   15 mg at 04/03/13 2323  . metoprolol (LOPRESSOR) tablet 50 mg  50 mg Oral QHS Josue Hector, MD   50 mg at 04/03/13 2324  . metoprolol tartrate (LOPRESSOR) tablet 25 mg  25 mg Oral Daily Josue Hector, MD   25 mg at 04/04/13 1001  . nitroGLYCERIN (NITROSTAT) SL tablet 0.4 mg  0.4 mg Sublingual Q5 Min x 3 PRN Roger A Arguello, PA-C      . ondansetron (ZOFRAN) injection 4 mg  4 mg Intravenous Q6H PRN Roger A Arguello, PA-C   4 mg at 04/04/13 1028  . polyethylene glycol (MIRALAX / GLYCOLAX) packet 17 g  17 g Oral Daily Josue Hector, MD   17 g at 04/04/13 1001  . potassium chloride SA (K-DUR,KLOR-CON) CR tablet 40 mEq  40 mEq Oral Daily Roger A Arguello, PA-C   40 mEq at 04/04/13 1001  . pravastatin (PRAVACHOL) tablet 10 mg  10 mg Oral Daily Josue Hector, MD   10 mg at 04/04/13 0003  . sodium chloride 0.9 % injection 3 mL  3 mL Intravenous Q12H Roger A Arguello, PA-C   3 mL at 04/04/13 1028  . sodium chloride 0.9 % injection 3 mL  3 mL Intravenous PRN Roger A Arguello, PA-C      . triamcinolone (KENALOG) 2.778 % cream 1 application  1 application Topical PRN [name], PA-C        Filed Vitals:   04/04/13 0045 04/04/13 2423 04/04/13 0932 04/04/13 1001  BP: 103/43 102/54 109/42 111/59  Pulse: 90 81 80 88  Temp: 98.3 F (36.8 C) 97.1 F (36.2 C)    TempSrc:  Oral    Resp: 16 18 20    Height:      Weight:  121 lb 7.6 oz (55.1 kg)    SpO2:  99% 97%     PHYSICAL EXAM General: NAD  Neck: JVP 8 cm, no thyromegaly or thyroid nodule.  Lungs: Clear to auscultation bilaterally with normal respiratory effort.  CV: Nondisplaced PMI. Heart regular S1/S2, no N3/I1, 3/6 systolic murmur at RUSB. No  peripheral edema. No carotid bruit. 2+ PT pulses bilaterally.  Abdomen: Soft, nontender, no hepatosplenomegaly, no distention.  Skin: Intact without lesions or rashes.  Neurologic: Alert and oriented x 3.  Psych: Normal affect.  Extremities: No clubbing or cyanosis.   TELEMETRY: Reviewed telemetry pt in NSR.  LABS: Basic Metabolic Panel:  Recent Labs  04/03/13 1501 04/03/13 1805 04/04/13 0345  NA 137 141  --   K 3.8 4.0  --   CL 99 99  --   CO2 30 27  --   GLUCOSE 144* 196*  --   BUN 34* 36*  --   CREATININE 1.6* 1.44*  --   CALCIUM 10.0 11.1*  --   MG  --   --  2.2   Liver Function Tests:  Recent Labs  04/03/13 1805  AST 39*  ALT 18  ALKPHOS 68  BILITOT 0.3  PROT 8.9*  ALBUMIN 4.5   No results found for this basename: LIPASE, AMYLASE,  in the last 72 hours CBC:  Recent Labs  04/03/13 1501 04/03/13 1805 04/04/13 0345  WBC 7.5 8.3 7.2  NEUTROABS 5.7  --   --   HGB 7.3 Repeated and verified X2.* 8.4* 9.0*  9.1*  HCT 23.4 Repeated and verified X2.* 27.7* 28.5*  29.0*  MCV 76.1* 77.6* 78.9  PLT 218.0 248 244   Cardiac Enzymes:  Recent Labs  04/04/13 0730  TROPONINI <0.30   BNP: No components found with this basename: POCBNP,  D-Dimer: No results found for this basename: DDIMER,  in the last 72 hours Hemoglobin A1C: No results found for this basename: HGBA1C,  in the last 72 hours Fasting Lipid Panel:  Recent Labs  04/04/13 0345  CHOL 151  HDL 29*  LDLCALC 99  TRIG 116  CHOLHDL 5.2   Thyroid Function Tests: No results found for this basename: TSH, T4TOTAL, FREET3, T3FREE, THYROIDAB,  in the last 72 hours Anemia Panel:  Recent Labs  04/04/13 0345  RETICCTPCT 2.5    RADIOLOGY: No results found.    ASSESSMENT AND PLAN: 1. CAD: s/p SVG-RCA in 12/05, then redo SVG-RCA in 1/11. She has been having symptoms consistent with crescendo angina recently. She  had a high risk stress test (some ST elevation on CPX) done this week. She has  not had any symptoms at rest, only with exertion. Dr. Aundra Dubin discussed proceeding with left heart catheterization to evaluate this further, and the patient is willing to proceed. However, the current plan is to evaluate the source of her anemia first. Several labs are currently pending, and I have consulted GI (spoke with Dr. Henrene Pastor who will see the patient). -s/p 1 unit PRBCs, Hgb 9.1 today.  - Given her symptoms and CPX response, I agree that cardiac cath is warranted. Plan for Tuesday rather than Monday to provide some flexibility in anemia evaluation. Plan to evaluate coronaries to see if there is something that needs to be fixed though PCI. However, with the need for dual antiplatelet therapy, it may not be something which can be performed immediately (based on anemia evaluation). Certainly, profound anemia could be making angina worse than it would otherwise be.  - Continue ASA 81 mg daily (does not seem to be bleeding overtly, no obvious blood in stool).  - Continue Imdur 15 mg daily (low dose, has had headaches with sublingual NTG).  - Continue Zetia/pravastatin. She has not been able to tolerate higher statin dosing.  - Continue current dose of beta blocker.   2. Bioprosthetic aortic valve replacement: Stable on last echo.   3. Diastolic CHF: currently compensated. EF 60-65% on echo in 4/14. She has NYHA class III symptoms that could be related to coronary ischemia and anemia as well as volume overload.  - Continue Lasix 60 mg daily given recent rise in creatinine to 1.48, with daily monitoring of renal function. She will need to watch salt intake closely.   4. CKD: As above, likely cath early next week. Continue Lasix pre-cath to 60 mg daily.   5. Hyperlipidemia: low HDL, continue current therapy.  6. Anemia: Found on pre-cath labs. Hemoglobin 7.3, down 4 pts from prior check. She denies melena/BRBPR. This certainly may be contributing to anginal and CHF-type symptoms. Given ischemia on  stress test, would transfuse to keep hemoglobin > 8, presently 9.1. Would give 40 mg IV Lasix along with any further transfusions. Will need anemia workup: stool guaiac, Fe studies/B12/folate pending. Again, I spoke with Dr. Henrene Pastor of GI with evaluation to follow. See discussion above regarding timing of cath.   7. Diabetes mellitus: very complex management and followed by endocrinologist at [facility]. Diabetes RN to speak with this physician later today with regards to management details.    Kate Sable, M.D., F.A.C.C.

## 2013-04-04 NOTE — Progress Notes (Signed)
Hypoglycemic Event  CBG: 35  Treatment: 15 GM carbohydrate snack  Symptoms: None  Follow-up CBG: Time:0700 CBG Result:177  Possible Reasons for Event: Inadequate meal intake  Comments/MD notified:Pt. Stated she has been having these hypoglycemic events for over 7 days. Pt. Wears an insulin pump and also has continuous glucose monitor that alerts her of when her blood glucose levels are low/high. Pt. Called out to RN when alarm sounded and pts. Blood glucose levels were checked.  On call MD, T. Turner, made aware of pts. Blood glucose level. Orders to have pt. Stop insulin pump received. Diabetes coordinator to be informed of pts. Insulin pump.    [name], Katherine Roan  Remember to initiate Hypoglycemia Order Set & complete

## 2013-04-05 DIAGNOSIS — I495 Sick sinus syndrome: Secondary | ICD-10-CM

## 2013-04-05 LAB — BASIC METABOLIC PANEL
CO2: 24 mEq/L (ref 19–32)
Calcium: 9.9 mg/dL (ref 8.4–10.5)
GFR calc non Af Amer: 37 mL/min — ABNORMAL LOW (ref >90)
Glucose, Bld: 95 mg/dL (ref 70–99)
Potassium: 3.6 mEq/L (ref 3.5–5.1)
Sodium: 138 mEq/L (ref 135–145)

## 2013-04-05 LAB — GLUCOSE, CAPILLARY
Glucose-Capillary: 85 mg/dL (ref 70–99)
Glucose-Capillary: 222 mg/dL — ABNORMAL HIGH (ref 70–99)

## 2013-04-05 LAB — CBC
Hemoglobin: 9.2 g/dL — ABNORMAL LOW (ref 12.0–15.0)
MCH: 24.5 pg — ABNORMAL LOW (ref 26.0–34.0)
MCHC: 30.7 g/dL (ref 30.0–36.0)
Platelets: 251 10*3/uL (ref 150–400)
RBC: 3.76 MIL/uL — ABNORMAL LOW (ref 3.87–5.11)

## 2013-04-05 MED ORDER — INSULIN PUMP
Freq: Three times a day (TID) | SUBCUTANEOUS | Status: DC
  Filled 2013-04-05: qty 1

## 2013-04-05 MED ORDER — INSULIN PUMP
Freq: Three times a day (TID) | SUBCUTANEOUS | Status: AC
  Administered 2013-04-05: 22:00:00 via SUBCUTANEOUS
  Administered 2013-04-06: 41.2 via SUBCUTANEOUS
  Administered 2013-04-07: 02:00:00 2 via SUBCUTANEOUS
  Administered 2013-04-08: 18:00:00 8.4 via SUBCUTANEOUS
  Administered 2013-04-08: 2 via SUBCUTANEOUS
  Administered 2013-04-08: 7.5 via SUBCUTANEOUS
  Administered 2013-04-08: 02:00:00 7 via SUBCUTANEOUS
  Administered 2013-04-08: 2.55 via SUBCUTANEOUS
  Filled 2013-04-05: qty 1

## 2013-04-05 NOTE — Progress Notes (Signed)
Patient has no complaints at this time.

## 2013-04-05 NOTE — Progress Notes (Signed)
SUBJECTIVE: [name] [name] denies chest pain and shortness of breath this morning. She sleeps with the head of the bed elevated, as she gets orthopneic otherwise.     Intake/Output Summary (Last 24 hours) at 04/05/13 0839 Last data filed at 04/05/13 0443  Gross per 24 hour  Intake   1083 ml  Output   1275 ml  Net   -192 ml    Current Facility-Administered Medications  Medication Dose Route Frequency Provider Last Rate Last Dose  . 0.9 %  sodium chloride infusion  250 mL Intravenous PRN Roger A Arguello, PA-C      . acetaminophen (TYLENOL) tablet 650 mg  650 mg Oral Q4H PRN Roger A Arguello, PA-C      . ALPRAZolam Duanne Moron) tablet 0.25 mg  0.25 mg Oral QHS Roger A Arguello, PA-C   0.25 mg at 04/04/13 2148  . aspirin EC tablet 81 mg  81 mg Oral Daily Roger A Arguello, PA-C   81 mg at 04/04/13 1001  . calcium-vitamin D (OSCAL WITH D) 500-200 MG-UNIT per tablet 1 tablet  1 tablet Oral BID Josue Hector, MD   1 tablet at 04/04/13 2148  . cholecalciferol (VITAMIN D) tablet 1,000 Units  1,000 Units Oral Daily Roger A Arguello, PA-C   1,000 Units at 04/04/13 1000  . ferrous sulfate tablet 325 mg  325 mg Oral Q breakfast Josue Hector, MD   325 mg at 04/05/13 9201   And  . docusate sodium (COLACE) capsule 100 mg  100 mg Oral Daily Josue Hector, MD   100 mg at 04/04/13 1000  . ezetimibe (ZETIA) tablet 10 mg  10 mg Oral Daily Roger A Arguello, PA-C   10 mg at 04/04/13 2259  . fenofibrate tablet 160 mg  160 mg Oral Daily Roger A Arguello, PA-C   160 mg at 04/04/13 2257  . fluticasone (FLONASE) 50 MCG/ACT nasal spray 2 spray  2 spray Each Nare Daily Roger A Arguello, PA-C   2 spray at 04/04/13 1002  . furosemide (LASIX) tablet 60 mg  60 mg Oral Daily Roger A Arguello, PA-C   60 mg at 04/04/13 1001  . insulin pump   Subcutaneous TID AC, HS, 0200 Lendon Colonel, NP      . isosorbide mononitrate (IMDUR) 24 hr tablet 15 mg  15 mg Oral Daily Roger A Arguello, PA-C   15 mg at 04/03/13 2323  .  metoprolol (LOPRESSOR) tablet 50 mg  50 mg Oral QHS Josue Hector, MD   50 mg at 04/04/13 2148  . metoprolol tartrate (LOPRESSOR) tablet 25 mg  25 mg Oral Daily Josue Hector, MD   25 mg at 04/04/13 1001  . nitroGLYCERIN (NITROSTAT) SL tablet 0.4 mg  0.4 mg Sublingual Q5 Min x 3 PRN Roger A Arguello, PA-C      . ondansetron (ZOFRAN) injection 4 mg  4 mg Intravenous Q6H PRN Roger A Arguello, PA-C   4 mg at 04/04/13 1028  . pantoprazole (PROTONIX) EC tablet 40 mg  40 mg Oral Q0600 Amy S Esterwood, PA-C   40 mg at 04/04/13 2303  . polyethylene glycol (MIRALAX / GLYCOLAX) packet 17 g  17 g Oral Daily Josue Hector, MD   17 g at 04/04/13 1001  . potassium chloride SA (K-DUR,KLOR-CON) CR tablet 40 mEq  40 mEq Oral Daily Roger A Arguello, PA-C   40 mEq at 04/04/13 1001  . pravastatin (PRAVACHOL) tablet 10 mg  10 mg  Oral Daily Josue Hector, MD   10 mg at 04/04/13 2257  . sodium chloride 0.9 % injection 3 mL  3 mL Intravenous Q12H Roger A Arguello, PA-C   3 mL at 04/04/13 2149  . sodium chloride 0.9 % injection 3 mL  3 mL Intravenous PRN Roger A Arguello, PA-C      . triamcinolone (KENALOG) 6.004 % cream 1 application  1 application Topical PRN [name], PA-C        Filed Vitals:   04/04/13 1001 04/04/13 2018 04/05/13 0159 04/05/13 0441  BP: 111/59 122/42 97/46 115/46  Pulse: 88 80 77 75  Temp:  97.9 F (36.6 C)  97.6 F (36.4 C)  TempSrc:  Oral  Oral  Resp:  20 20 20   Height:      Weight:    123 lb 12.8 oz (56.155 kg)  SpO2:  97% 98% 98%    PHYSICAL EXAM General: NAD  Neck: JVP 7-8 cmH2O, no thyromegaly or thyroid nodule.  Lungs: Clear to auscultation bilaterally with normal respiratory effort.  CV: Nondisplaced PMI. Heart regular S1/S2, no H9/X7, 3/6 systolic murmur at RUSB. No peripheral edema. No carotid bruit. 2+ PT pulses bilaterally.  Abdomen: Soft, nontender, no hepatosplenomegaly, no distention.  Skin: Intact without lesions or rashes.  Neurologic: Alert and oriented x  3.  Psych: Normal affect.  Extremities: No clubbing or cyanosis.   TELEMETRY: Reviewed telemetry pt in NSR.   LABS: Basic Metabolic Panel:  Recent Labs  04/04/13 0345 04/04/13 1400 04/05/13 0640  NA  --  139 138  K  --  3.9 3.6  CL  --  97 102  CO2  --  27 24  GLUCOSE  --  182* 95  BUN  --  38* 36*  CREATININE  --  1.46* 1.50*  CALCIUM  --  10.3 9.9  MG 2.2  --   --    Liver Function Tests:  Recent Labs  04/03/13 1805  AST 39*  ALT 18  ALKPHOS 68  BILITOT 0.3  PROT 8.9*  ALBUMIN 4.5   No results found for this basename: LIPASE, AMYLASE,  in the last 72 hours CBC:  Recent Labs  04/03/13 1501  04/04/13 0345 04/05/13 0640  WBC 7.5  < > 7.2 8.4  NEUTROABS 5.7  --   --   --   HGB 7.3 Repeated and verified X2.*  < > 9.0*  9.1* 9.2*  HCT 23.4 Repeated and verified X2.*  < > 28.5*  29.0* 30.0*  MCV 76.1*  < > 78.9 79.8  PLT 218.0  < > 244 251  < > = values in this interval not displayed. Cardiac Enzymes:  Recent Labs  04/04/13 0730 04/04/13 1400  TROPONINI <0.30 <0.30   BNP: No components found with this basename: POCBNP,  D-Dimer: No results found for this basename: DDIMER,  in the last 72 hours Hemoglobin A1C:  Recent Labs  04/04/13 0345  HGBA1C 6.3*   Fasting Lipid Panel:  Recent Labs  04/04/13 0345  CHOL 151  HDL 29*  LDLCALC 99  TRIG 116  CHOLHDL 5.2   Thyroid Function Tests:  Recent Labs  04/04/13 0345  TSH 2.385   Anemia Panel:  Recent Labs  04/04/13 0345  VITAMINB12 577  FOLATE >20.0  FERRITIN 10  TIBC 636*  IRON 73  RETICCTPCT 2.5    RADIOLOGY: No results found.    ASSESSMENT AND PLAN: 1. CAD: s/p SVG-RCA in 12/05, then redo SVG-RCA  in 1/11. She has been having symptoms consistent with crescendo angina recently. She had a high risk stress test (some ST elevation on CPX) done last week. She has not had any symptoms at rest, only with exertion. Dr. Aundra Dubin discussed proceeding with left heart catheterization  to evaluate this further, and the patient is willing to proceed. However, the current plan is to evaluate the source of her anemia first, with plans for EGD tomorrow. Appreciate GI input.  -Hgb 9.2 today.  - Given her symptoms and CPX response, I agree that cardiac cath is warranted. Plan for Tuesday rather than Monday to provide some flexibility in anemia evaluation. Plan to evaluate coronaries to see if there is something that needs to be fixed though PCI. However, with the need for dual antiplatelet therapy, it may not be something which can be performed immediately (based on anemia evaluation). Certainly, profound anemia could be making angina worse than it would otherwise be.  - Continue ASA 81 mg daily - Continue Imdur 15 mg daily (low dose, has had headaches with sublingual NTG).  - Continue Zetia/pravastatin. She has not been able to tolerate higher statin dosing.  - Continue current dose of beta blocker.  2. Bioprosthetic aortic valve replacement: Stable on last echo.  3. Diastolic CHF: currently compensated. EF 60-65% on echo in 4/14. She has NYHA class III symptoms that could be related to coronary ischemia and anemia as well as volume overload.  - Continue Lasix 60 mg daily given recent rise in creatinine to 1.48 (1.5 today), with daily monitoring of renal function. She will need to watch salt intake closely.  4. CKD: As above, likely cath early next week. Continue Lasix pre-cath at 60 mg daily, but may have to reduce further if renal function worsens.  5. Hyperlipidemia: low HDL, continue current therapy.  6. Anemia: Found on pre-cath labs. Hemoglobin 7.3, down 4 pts from prior check. She denies melena/BRBPR. This certainly may be contributing to anginal and CHF-type symptoms. Given ischemia on stress test, would transfuse to keep hemoglobin > 8, presently 9.2. Would give 40 mg IV Lasix along with any further transfusions. EGD tomorrow, with capsule endoscopy to follow cath. See discussion  above regarding timing of cath.  7. Diabetes mellitus: very complex management with insulin pump, and followed by endocrinologist at [facility].    Kate Sable, M.D., F.A.C.C.

## 2013-04-05 NOTE — Progress Notes (Signed)
Co-signed for [employer] RN/BSN for assessments, IV assessments, I's & O's, medications administration, care plans, patient education, and progress notes. Tonny Branch, RN/BSN

## 2013-04-05 NOTE — Progress Notes (Addendum)
Report given to receiving RN. Patient is stable, with no complaints and no signs or symptoms of distress or discomfort.

## 2013-04-05 NOTE — Progress Notes (Signed)
Patient ID: [name] [name], female   DOB: 02/26/1951, 62 y.o.   MRN: [id number] [facility] Progress Note  Subjective: Feels OK - no problems with SOB or chest pain since admit HGB 9.2 post transfusions  Objective:  Vital signs in last 24 hours: Temp:  [97.6 F (36.4 C)-97.9 F (36.6 C)] 97.6 F (36.4 C) (09/14 0441) Pulse Rate:  [75-85] 85 (09/14 0933) Resp:  [20] 20 (09/14 0441) BP: (97-126)/(42-49) 126/49 mmHg (09/14 0933) SpO2:  [97 %-98 %] 98 % (09/14 0441) Weight:  [123 lb 12.8 oz (56.155 kg)] 123 lb 12.8 oz (56.155 kg) (09/14 0441) Last BM Date: 04/02/13 General:   Alert,  Well-developed,WF    in NAD Heart:  Regular rate and rhythm;soft murmur Pulm;clear bilat Abdomen:  Soft, nontender and nondistended. Normal bowel sounds, without guarding, and without rebound.  Insulin pump Extremities:  Without edema. Neurologic:  Alert and  oriented x4;  grossly normal neurologically. Psych:  Alert and cooperative. Normal mood and affect.  Intake/Output from previous day: 09/13 0701 - 09/14 0700 In: 1083 [P.O.:1080; I.V.:3] Out: 1275 [Urine:1275] Intake/Output this shift:    Lab Results:  Recent Labs  04/03/13 1805 04/04/13 0345 04/05/13 0640  WBC 8.3 7.2 8.4  HGB 8.4* 9.0*  9.1* 9.2*  HCT 27.7* 28.5*  29.0* 30.0*  PLT 248 244 251   BMET  Recent Labs  04/03/13 1805 04/04/13 1400 04/05/13 0640  NA 141 139 138  K 4.0 3.9 3.6  CL 99 97 102  CO2 27 27 24   GLUCOSE 196* 182* 95  BUN 36* 38* 36*  CREATININE 1.44* 1.46* 1.50*  CALCIUM 11.1* 10.3 9.9   LFT  Recent Labs  04/03/13 1805  PROT 8.9*  ALBUMIN 4.5  AST 39*  ALT 18  ALKPHOS 68  BILITOT 0.3   PT/INR  Recent Labs  04/03/13 1501  LABPROT 13.2*  INR 1.3*      Assessment / Plan:  #1 Complicated 62 yo female with CHF,S/P AVR,CABG with ab normal stress test and chest pain- for cath Tuesday #2 IDDM- insulin pump #3 Diabetic gastroparesis #4 SSS- s/p pacemaker #5  CKD #6  recurrent anemia,heme positive stool- for EGD tomorrow am-   She is stable post transfusions     LOS: 2 days   Amy Esterwood  04/05/2013, 11:36 AM  GI ATTENDING  Interval history and laboratories reviewed. Patient seen and examined. Agree with H&P as outlined above. Stable from GI standpoint. Plan EGD tomorrow regarding anemia and Hemoccult-positive stool. Also will perform biopsies to rule out iron deficiency secondary to sprue.  Docia Chuck. Geri Seminole., M.D. [facility] Division of Gastroenterology

## 2013-04-05 NOTE — Progress Notes (Signed)
Patient's 2am CBG 80. Patient drank regular Coke to keep blood glucose from dropping more. Will recheck CBG in Am

## 2013-04-05 NOTE — Progress Notes (Signed)
Paged doctor about time changes for charting insulin pump administration to coincide with meals as they arrived to the unit. Charting was inaccurate and did not match the times CBG was obtain and when insulin was due.

## 2013-04-06 ENCOUNTER — Inpatient Hospital Stay (HOSPITAL_BASED_OUTPATIENT_CLINIC_OR_DEPARTMENT_OTHER): Admission: RE | Admit: 2013-04-06 | Payer: BC Managed Care – PPO | Source: Ambulatory Visit | Admitting: Cardiology

## 2013-04-06 ENCOUNTER — Encounter (HOSPITAL_COMMUNITY)
Admission: EM | Disposition: A | Discharge status: Home / Self Care | Payer: Self-pay | Source: Home / Self Care | Attending: Cardiology

## 2013-04-06 ENCOUNTER — Encounter (HOSPITAL_COMMUNITY): Payer: Self-pay | Admitting: Internal Medicine

## 2013-04-06 DIAGNOSIS — I200 Unstable angina: Secondary | ICD-10-CM

## 2013-04-06 DIAGNOSIS — I359 Nonrheumatic aortic valve disorder, unspecified: Secondary | ICD-10-CM

## 2013-04-06 HISTORY — PX: ESOPHAGOGASTRODUODENOSCOPY: SHX5428

## 2013-04-06 LAB — GLUCOSE, CAPILLARY
Glucose-Capillary: 169 mg/dL — ABNORMAL HIGH (ref 70–99)
Glucose-Capillary: 84 mg/dL (ref 70–99)
Glucose-Capillary: 119 mg/dL — ABNORMAL HIGH (ref 70–99)
Glucose-Capillary: 161 mg/dL — ABNORMAL HIGH (ref 70–99)
Glucose-Capillary: 107 mg/dL — ABNORMAL HIGH (ref 70–99)
Glucose-Capillary: 213 mg/dL — ABNORMAL HIGH (ref 70–99)
Glucose-Capillary: 56 mg/dL — ABNORMAL LOW (ref 70–99)
Glucose-Capillary: 73 mg/dL (ref 70–99)
Glucose-Capillary: 239 mg/dL — ABNORMAL HIGH (ref 70–99)
Glucose-Capillary: 276 mg/dL — ABNORMAL HIGH (ref 70–99)

## 2013-04-06 LAB — CBC
HCT: 30.3 % — ABNORMAL LOW (ref 36.0–46.0)
MCH: 24.5 pg — ABNORMAL LOW (ref 26.0–34.0)
MCHC: 30.7 g/dL (ref 30.0–36.0)
MCV: 79.9 fL (ref 78.0–100.0)
Platelets: 252 10*3/uL (ref 150–400)
RDW: 16.9 % — ABNORMAL HIGH (ref 11.5–15.5)

## 2013-04-06 LAB — BASIC METABOLIC PANEL
BUN: 36 mg/dL — ABNORMAL HIGH (ref 6–23)
Calcium: 10 mg/dL (ref 8.4–10.5)
Creatinine, Ser: 1.4 mg/dL — ABNORMAL HIGH (ref 0.50–1.10)
GFR calc Af Amer: 47 mL/min — ABNORMAL LOW (ref >90)

## 2013-04-06 SURGERY — EGD (ESOPHAGOGASTRODUODENOSCOPY)
Anesthesia: Moderate Sedation

## 2013-04-06 SURGERY — JV LEFT AND RIGHT HEART CATHETERIZATION WITH CORONARY ANGIOGRAM
Anesthesia: Moderate Sedation

## 2013-04-06 MED ORDER — DIPHENHYDRAMINE HCL 50 MG/ML IJ SOLN
INTRAMUSCULAR | Status: AC
  Filled 2013-04-06: qty 1

## 2013-04-06 MED ORDER — DEXTROSE 50 % IV SOLN
INTRAVENOUS | Status: AC
  Filled 2013-04-06: qty 50

## 2013-04-06 MED ORDER — FENTANYL CITRATE 0.05 MG/ML IJ SOLN
INTRAMUSCULAR | Status: DC | PRN
  Administered 2013-04-06 (×3): 25 ug via INTRAVENOUS

## 2013-04-06 MED ORDER — MIDAZOLAM HCL 5 MG/ML IJ SOLN
INTRAMUSCULAR | Status: AC
  Filled 2013-04-06: qty 3

## 2013-04-06 MED ORDER — FENTANYL CITRATE 0.05 MG/ML IJ SOLN
INTRAMUSCULAR | Status: AC
  Filled 2013-04-06: qty 4

## 2013-04-06 MED ORDER — MIDAZOLAM HCL 10 MG/2ML IJ SOLN
INTRAMUSCULAR | Status: DC | PRN
  Administered 2013-04-06 (×3): 2 mg via INTRAVENOUS

## 2013-04-06 MED ORDER — DEXTROSE 50 % IV SOLN
INTRAVENOUS | Status: AC
  Administered 2013-04-06: 50 mL via INTRAVENOUS
  Filled 2013-04-06: qty 50

## 2013-04-06 MED ORDER — DEXTROSE 50 % IV SOLN
25.0000 mL | Freq: Once | INTRAVENOUS | Status: AC
  Administered 2013-04-06: 50 mL via INTRAVENOUS

## 2013-04-06 MED ORDER — BUTAMBEN-TETRACAINE-BENZOCAINE 2-2-14 % EX AERO
INHALATION_SPRAY | CUTANEOUS | Status: DC | PRN
  Administered 2013-04-06: 2 via TOPICAL

## 2013-04-06 NOTE — H&P (View-Only) (Signed)
Patient ID: [name] [name], female   DOB: 15-Feb-1951, 62 y.o.   MRN: [id number] [facility] Progress Note  Subjective: Feels OK - no problems with SOB or chest pain since admit HGB 9.2 post transfusions  Objective:  Vital signs in last 24 hours: Temp:  [97.6 F (36.4 C)-97.9 F (36.6 C)] 97.6 F (36.4 C) (09/14 0441) Pulse Rate:  [75-85] 85 (09/14 0933) Resp:  [20] 20 (09/14 0441) BP: (97-126)/(42-49) 126/49 mmHg (09/14 0933) SpO2:  [97 %-98 %] 98 % (09/14 0441) Weight:  [123 lb 12.8 oz (56.155 kg)] 123 lb 12.8 oz (56.155 kg) (09/14 0441) Last BM Date: 04/02/13 General:   Alert,  Well-developed,WF    in NAD Heart:  Regular rate and rhythm;soft murmur Pulm;clear bilat Abdomen:  Soft, nontender and nondistended. Normal bowel sounds, without guarding, and without rebound.  Insulin pump Extremities:  Without edema. Neurologic:  Alert and  oriented x4;  grossly normal neurologically. Psych:  Alert and cooperative. Normal mood and affect.  Intake/Output from previous day: 09/13 0701 - 09/14 0700 In: 1083 [P.O.:1080; I.V.:3] Out: 1275 [Urine:1275] Intake/Output this shift:    Lab Results:  Recent Labs  04/03/13 1805 04/04/13 0345 04/05/13 0640  WBC 8.3 7.2 8.4  HGB 8.4* 9.0*  9.1* 9.2*  HCT 27.7* 28.5*  29.0* 30.0*  PLT 248 244 251   BMET  Recent Labs  04/03/13 1805 04/04/13 1400 04/05/13 0640  NA 141 139 138  K 4.0 3.9 3.6  CL 99 97 102  CO2 27 27 24   GLUCOSE 196* 182* 95  BUN 36* 38* 36*  CREATININE 1.44* 1.46* 1.50*  CALCIUM 11.1* 10.3 9.9   LFT  Recent Labs  04/03/13 1805  PROT 8.9*  ALBUMIN 4.5  AST 39*  ALT 18  ALKPHOS 68  BILITOT 0.3   PT/INR  Recent Labs  04/03/13 1501  LABPROT 13.2*  INR 1.3*      Assessment / Plan:  #1 Complicated 62 yo female with CHF,S/P AVR,CABG with ab normal stress test and chest pain- for cath Tuesday #2 IDDM- insulin pump #3 Diabetic gastroparesis #4 SSS- s/p pacemaker #5  CKD #6  recurrent anemia,heme positive stool- for EGD tomorrow am-   She is stable post transfusions     LOS: 2 days   Amy Esterwood  04/05/2013, 11:36 AM  GI ATTENDING  Interval history and laboratories reviewed. Patient seen and examined. Agree with H&P as outlined above. Stable from GI standpoint. Plan EGD tomorrow regarding anemia and Hemoccult-positive stool. Also will perform biopsies to rule out iron deficiency secondary to sprue.  Docia Chuck. Geri Seminole., M.D. [facility] Division of Gastroenterology

## 2013-04-06 NOTE — Progress Notes (Signed)
Co-signed for [employer] RN/BSN for assessments, IV assessments, I's & O's, medications administration, care plans, patient education, and progress notes. Tonny Branch, RN/BSN

## 2013-04-06 NOTE — Consult Note (Addendum)
[name] [name] is an 62 y.o. female.   Reason for Appointment: Insulin Pump consultation:   History of Present Illness   Diagnosis: Type 1 DIABETES MELITUS, date of diagnosis:       DIABETES history: Patient has been initially treated for diabetes with oral agents with relatively good control but subsequently went on insulin and. She thinks that she was also tried on metformin and Avandia and with Avandia went into congestive heart failure. She did also tried Actos and not clear why this was stopped the She also has been on Januvia for the last 5 years but this was stopped last week by her endocrinologist.  She has been on an insulin pump for the last 14 years and usually has had difficulty to control and requiring relatively large amounts of insulin. She is using the [employer] Insulin Pump She has been in the last couple of years takinga basal rate of as much as 5 units an hour on her insulin pump with fair control. However she usually has had marked variability in her blood sugars with periods of hyperglycemia and hypoglycemia She also has been using a DexCom continuous glucose sensor more recently with some benefit since she has relatively low recognition of hypoglycemia.  Her main problem recently has been local allergic reactions with insulin. She apparently had use of a drop in her insulin pump between 2007 and 2010 with good results. Apparently her insulin requirement increased subsequently and also she started to notice getting redness and swelling at the infusion site. She would also get a local induration and swelling infusion site. Subsequently she started having more analyzed itching especially on her trunk. Because of that she would also need to change her infusion set every day. She was starting to use NovoLog this year because of insurance preference and July of Apidra and apparently with this her symptoms of allergic reaction were much worse.  Her insulin requirement however  over the last month or so has been gradually declining, even when she was on NovoLog. She thinks that she has had more tendency to low sugars overnight and her basal rates have been gradually cut back Over the last 15-17 days or so she has gone back to Apidra insulin and with this her local discomfort, itching and reaction at the infusion site is much less and she is not having as much generalized itching However her insulin requirement over the last few days has been drastically reduced with needing to reduce her basal rate.  Because of severe and prolonged low blood sugars early this morning her insulin pump was suspended at 5 AM and not restarted until after her endoscopy in the early afternoon. She also required at least 2 injections of dextrose 50% for treating the hypoglycemia At this time the patient is using a 10% temporary basal but is not using her sensor, a blood sugar appears to be in the 200 range  The pump SETTINGS are: Basal rate: Currently 1.5 at midnight and 4.3-4.5 from 11 AM until midnight   Carb Ratio: 1: 2 Correction factor 1: 15  HYPOGLYCEMIC episodes are  more at night recently. She does not always have symptoms and may go down into the 30s without symptoms, at that time she only has some sweating  Previous lab results done by endocrinologist at [facility] include a mildly positive GAD antibody and C-peptide level of 0.5, negative islet cell antibody  Lab Results  Component Value Date   HGBA1C 6.3* 04/04/2013  CBG (last 3)   Recent Labs  04/06/13 1145 04/06/13 1442 04/06/13 1558  GLUCAP 73 219* 239*         Medication List    ASK your doctor about these medications       ALPRAZolam 0.5 MG tablet  Commonly known as:  XANAX  Take 0.25 mg by mouth at bedtime as needed for sleep.     aspirin EC 81 MG tablet  Take 81 mg by mouth daily.     cholecalciferol 1000 UNITS tablet  Commonly known as:  VITAMIN D  Take 1,000 Units by mouth daily.     desoximetasone  0.05 % cream  Commonly known as:  TOPICORT  Apply 1 application topically 2 (two) times daily as needed. When skin starts peeling     ezetimibe 10 MG tablet  Commonly known as:  ZETIA  Take 10 mg by mouth at bedtime.     fenofibrate 145 MG tablet  Commonly known as:  TRICOR  Take 145 mg by mouth at bedtime.     fluticasone 50 MCG/ACT nasal spray  Commonly known as:  FLONASE  Place 2 sprays into the nose daily as needed for rhinitis. For nasal congestion     furosemide 40 MG tablet  Commonly known as:  LASIX  Take 60 mg by mouth daily.     GLUCAGON EMERGENCY 1 MG injection  Generic drug:  glucagon     insulin pump 100 unit/ml Soln  - Inject into the skin continuous. Apidra  - Basal Rate: Midnight-1100=2.5  - 1100-1300=4.2  - 1300-1700=4.3  - 1700-midnight=4.5     isosorbide mononitrate 30 MG 24 hr tablet  Commonly known as:  IMDUR  Take 15 mg by mouth at bedtime.     metoprolol 50 MG tablet  Commonly known as:  LOPRESSOR  Take 25-50 mg by mouth 2 (two) times daily. Takes 16m in the am & 564min the pm     multivitamin with minerals tablet  Take 1 tablet by mouth daily.     NITROLINGUAL 0.4 MG/SPRAY spray  Generic drug:  nitroGLYCERIN  USE ONE SPRAY UNDER TONGUE EVERY 5 MINUTES AS NEEDED FOR CHEST PAIN. MAY REPEAT THREE TIMES     omeprazole 20 MG tablet  Commonly known as:  PRILOSEC OTC  Take 20 mg by mouth daily.     polyethylene glycol packet  Commonly known as:  MIRALAX / GLYCOLAX  Take 8.5 g by mouth daily as needed. For constipation     potassium chloride SA 20 MEQ tablet  Commonly known as:  K-DUR,KLOR-CON  Take 40 mEq by mouth daily.     pravastatin 10 MG tablet  Commonly known as:  PRAVACHOL  Take 10 mg by mouth at bedtime.     RA CALCIUM PLUS VITAMIN D 600-400 MG-UNIT per tablet  Generic drug:  Calcium Carbonate-Vitamin D  Take 1 tablet by mouth 2 (two) times daily.        Allergies:  Allergies  Allergen Reactions  .  Clindamycin/Lincomycin Anaphylaxis and Shortness Of Breath  . Penicillins Anaphylaxis, Hives and Rash    Childhood allergy   . Gentamycin [Gentamicin] Hives, Itching and Rash    Reaction noted post loop recorder implant, where gentamycin was used for irrigation. Topical prep  . Tizanidine Other (See Comments)    Hallucinate  . Atorvastatin Other (See Comments)     increased LFT's  . Avandia [Rosiglitazone Maleate]     Congestive heart failure  . Codeine Nausea  And Vomiting  . Crestor [Rosuvastatin] Other (See Comments)    Muscle and joint pain &  . Erythromycin Nausea And Vomiting  . Metformin     Congestive heart failure  . Novolog [Insulin Aspart] Itching    Patient reports she has severe itching with Novolog insulin  . Pravastatin Other (See Comments)    In high doses (20-8m) causes muscle and joint pain   . Simvastatin     Increased LFT's  . Cephalexin Swelling and Rash    Extremities swell   . Iodinated Diagnostic Agents Other (See Comments)    PAIN DURING LYMPHANGIOGRAM '86, NO ALLERGY TO IV CONTRAST  . Neosporin [Neomycin-Bacitracin Zn-Polymyx] Rash    Past Medical History  Diagnosis Date  . Aortic stenosis      bicuspid aortic valve; mean gradient of 20 mmHg in 2/10; aortic valve   replacement surgery(19-mm Edwards pericardial valve) with a redo coronary artery bypass graft procedure      in 07/2009; postoperative Dressler's syndrome    . Coronary artery disease     status post RCA saphenous vein graft in 06/1999; multiple preceding PCIs with restenosis; total obstruction of RCA in 2006 with patent graft   . CHF (congestive heart failure)     History of CHF; recurred following AVR/CABG surgery; intermittent PND  . Hyperlipidemia   . Hypertension   . Small bowel obstruction     Recurrent; resolved after resection of a lipoma  . Osteopenia      hip on DEXA in October 2007.  .Marland KitchenAnemia   . Carcinoma, renal cell 05/2007    Laparoscopic right nephrectomy  . NASH  (nonalcoholic steatohepatitis) 1999    -biopsy in 1999  . Diabetes mellitus 03/2010     TYPE II: Hemoglobin A1c of 7.4 in  03/2010; 8.4 in 06/2010; treated with insulin pump  . Migraines   . Duodenal ulcer     Remote; H. pylori positive  . Colitis, ischemic 2008  . Gastroesophageal reflux disease     ? of GASTROPARESIS; IRRITABLE BOWEL SYNDROME  . Hodgkin's disease 1991    Mantle radiation therapy  . Exertional dyspnea     chest discomfort  . Statin intolerance   . Hypercalcemia     Ca-11.1 in 07/2011  . Vitamin D deficiency   . Costochondritis   . Insomnia   . Anemia   . Gastroparesis due to DM 05/01/2012  . PONV (postoperative nausea and vomiting)   . Hyperplastic gastric polyp 06/23/2012  . Arthritis   . Heart murmur   . Osteoporosis   . Pacemaker 08/21/2012    Medtronic Adapta L dual-chamber pacemaker-Dr. TLovena Le . Anxiety     Past Surgical History  Procedure Laterality Date  . Total abdominal hysterectomy w/ bilateral salpingoophorectomy  2000  . Breast excisional biopsy      benign, bilateral  . Nephrectomy  2008    Laparoscopic right nephrectomy, renal cell carcinoma  . Coronary artery bypass graft  2005, 2011    CABG-most recent SVG to RCA-12/05; RCA occluded with patent graft in 2006; Redo bypass in 07/2009  . Tumor excision  1981    removal of Hodgkins lymphoma  . Exploratory laparotomy w/ bowel resection      Small bowel resection of lipoma  . Aortic valve replacement  2011    Aortic valve replacement surgery with a 19 mm bioprosthetic device post redo CABG January 2011.  . Colonoscopy      Multiple with adenomatous polyps  .  Cervical biopsy    . Liver biopsy    . Bone marrow biopsy    . Esophagogastroduodenoscopy    . Esophagogastroduodenoscopy  06/23/2012    Procedure: ESOPHAGOGASTRODUODENOSCOPY (EGD);  Surgeon: Gatha Mayer, MD;  Location: [facility];  Service: Endoscopy;  Laterality: N/A;  . Colonoscopy  06/23/2012    Procedure: COLONOSCOPY;   Surgeon: Gatha Mayer, MD;  Location: [facility];  Service: Endoscopy;  Laterality: N/A;  [name] dilation  06/23/2012    Procedure: [name] DILATION;  Surgeon: Gatha Mayer, MD;  Location: [facility];  Service: Endoscopy;;  56 fr  . Pacemaker insertion  08/21/2012    Medtronic Adapta L dual-chamber pacemaker    Family History  Problem Relation Age of Onset  . Heart attack Mother   . Heart disease Mother   . Heart attack Father   . Diabetes Father   . Heart disease Father   . Hypertension Brother   . Glaucoma Brother   . Lung cancer Sister     meta  . Cancer Sister     breast - half sister (Paternal)  . Ovarian cancer Sister   . Cancer Sister     cervical and lung - half sister (maternal)  . Breast cancer Sister   . Colon cancer Neg Hx   . Stomach cancer Neg Hx     Social History:  reports that she has never smoked. She has never used smokeless tobacco. She reports that she does not drink alcohol or use illicit drugs.   REVIEW of systems:  She has had significant problem with CAD She has had pacemaker because of symptomatic bradycardia She reportedly has had gastroparesis Recently being evaluated for severe anemia She has had a multinodular goiter for few years which currently has been benign and TSH on this admission is normal She has had mild deterioration in renal function over the last 1-2 years mostly since she had nephrectomy in 2011 for renal cell cancer She does not complain of much numbness or tingling in her feet She does not have any cord intolerance but has coldness of her hands and feet in the evenings She does not have any symptoms of diarrhea   EXAM:  BP 111/46  Pulse 81  Temp(Src) 97.7 F (36.5 C) (Oral)  Resp 18  Ht 5' 2"  (1.575 m)  Wt 122 lb 6.4 oz (55.52 kg)  BMI 22.38 kg/m2  SpO2 100%  Physical Exam  Constitutional: She is oriented to person, place, and time and well-developed, well-nourished, and in no distress.  Eyes:  Normal  external in appearance, fundii not examined  Neck: Thyromegaly present.  1.5-2 cm firm right-sided nodule. Left side just palpable on swallowing  Cardiovascular: Normal rate and regular rhythm.   No murmur heard. Musculoskeletal: She exhibits no edema.  Neurological: She is alert and oriented to person, place, and time.  Biceps reflexes are normal  Skin: Skin is warm and dry. She is not diaphoretic.  Psychiatric: Affect normal.   ASSESSMENT:  Diabetes which was initially presenting as type II along with marked insulin resistance but more recently requiring progressively lower dose of insulin She has also had a local and systemic allergic reaction to NovoLog insulin which appears to be improving with switching to Apidra about 2 weeks ago. Unclear why she has had a drastic reduction in her insulin requirement over the last few days and prolonged hypoglycemia today without any change in her renal function or evidence of hypothyroidism or adrenal crisis  Her diet has been relatively stable and intake has not decreased She is a not using her continuous glucose monitoring sensor but is fully trained in use of her insulin pump and sensor  Her recent history was discussed with her endocrinologist at [facility] today  Plan: Continue insulin pump   Will try a basal rate of 0.5 until tomorrow morning along with carbohydrate coverage at 1:10 and correction 1:50  Basal rate will be adjusted tonight based on her blood sugar readings which will be checked every hour If she is able to use her glucose sensor tonight she can  insert it and use it  Since she will not be able to take her glucose sensor in the cardiac cath lab tomorrow Will need to check her blood sugar hourly during the procedure She will also bolus for her meals and snacks tonight   Masiyah Engen 04/06/2013, 5:41 PM

## 2013-04-06 NOTE — Progress Notes (Signed)
Spoke briefly with patient and nurse by phone.  They state that CBG's are greater than 200 mg/dL and they wonder if patient should resume insulin pump.  Patient has communicated with endocrinologist from [place], Dr Cathren Harsh regarding decreasing basal rates to 0.5 units/ hour.  Patient is to be seen by Dr. Dwyane Dee this evening for continued adjustments and orders regarding insulin pump.   Adah Perl, RN, BC-ADM Inpatient Diabetes Coordinator Pager [phone]

## 2013-04-06 NOTE — Interval H&P Note (Signed)
History and Physical Interval Note:  04/06/2013 8:39 AM  [name] [name]  has presented today for surgery, with the diagnosis of anemia  The various methods of treatment have been discussed with the patient and family. After consideration of risks, benefits and other options for treatment, the patient has consented to  Procedure(s): ESOPHAGOGASTRODUODENOSCOPY (EGD) (N/A) as a surgical intervention .  The patient's history has been reviewed, patient examined, no change in status, stable for surgery.  I have reviewed the patient's chart and labs.  Questions were answered to the patient's satisfaction.     Scarlette Shorts

## 2013-04-06 NOTE — Progress Notes (Addendum)
Patient ID: [name] [name], female   DOB: [birth year], 62 y.o.   MRN: [id number]   Had EGD this morning, no source for GI bleeding.  No BRBPR or melena.  She has been walking halls without dyspnea or chest pain.  She is tolerating Imdur, headaches have resolved.    Intake/Output Summary (Last 24 hours) at 04/06/13 1204 Last data filed at 04/06/13 0701  Gross per 24 hour  Intake    700 ml  Output   2700 ml  Net  -2000 ml    Current Facility-Administered Medications  Medication Dose Route Frequency Provider Last Rate Last Dose  . 0.9 %  sodium chloride infusion  250 mL Intravenous PRN Roger A Arguello, PA-C      . acetaminophen (TYLENOL) tablet 650 mg  650 mg Oral Q4H PRN Roger A Arguello, PA-C      . ALPRAZolam Duanne Moron) tablet 0.25 mg  0.25 mg Oral QHS Roger A Arguello, PA-C   0.25 mg at 04/05/13 2225  . aspirin EC tablet 81 mg  81 mg Oral Daily Roger A Arguello, PA-C   81 mg at 04/05/13 0932  . calcium-vitamin D (OSCAL WITH D) 500-200 MG-UNIT per tablet 1 tablet  1 tablet Oral BID Josue Hector, MD   1 tablet at 04/05/13 2226  . cholecalciferol (VITAMIN D) tablet 1,000 Units  1,000 Units Oral Daily [name], PA-C   1,000 Units at 04/05/13 [phone]  . ferrous sulfate tablet 325 mg  325 mg Oral Q breakfast Josue Hector, MD   325 mg at 04/05/13 3662   And  . docusate sodium (COLACE) capsule 100 mg  100 mg Oral Daily Josue Hector, MD   100 mg at 04/05/13 0932  . ezetimibe (ZETIA) tablet 10 mg  10 mg Oral Daily Roger A Arguello, PA-C   10 mg at 04/05/13 2226  . fenofibrate tablet 160 mg  160 mg Oral Daily Roger A Arguello, PA-C   160 mg at 04/05/13 2225  . fluticasone (FLONASE) 50 MCG/ACT nasal spray 2 spray  2 spray Each Nare Daily Roger A Arguello, PA-C   2 spray at 04/04/13 1002  . furosemide (LASIX) tablet 60 mg  60 mg Oral Daily Roger A Arguello, PA-C   60 mg at 04/05/13 0932  . insulin pump   Subcutaneous TID AC, HS, 0200 Lendon Colonel, NP      . isosorbide mononitrate  (IMDUR) 24 hr tablet 15 mg  15 mg Oral Daily Roger A Arguello, PA-C   15 mg at 04/05/13 2226  . metoprolol (LOPRESSOR) tablet 50 mg  50 mg Oral QHS Josue Hector, MD   50 mg at 04/05/13 2227  . metoprolol tartrate (LOPRESSOR) tablet 25 mg  25 mg Oral Daily Josue Hector, MD   25 mg at 04/05/13 0933  . nitroGLYCERIN (NITROSTAT) SL tablet 0.4 mg  0.4 mg Sublingual Q5 Min x 3 PRN Roger A Arguello, PA-C      . ondansetron (ZOFRAN) injection 4 mg  4 mg Intravenous Q6H PRN Roger A Arguello, PA-C   4 mg at 04/04/13 1028  . pantoprazole (PROTONIX) EC tablet 40 mg  40 mg Oral Q0600 Amy S Esterwood, PA-C   40 mg at 04/05/13 2225  . polyethylene glycol (MIRALAX / GLYCOLAX) packet 17 g  17 g Oral Daily Josue Hector, MD   17 g at 04/05/13 0933  . potassium chloride SA (K-DUR,KLOR-CON) CR tablet 40 mEq  40 mEq Oral  Daily Roger A Arguello, PA-C   40 mEq at 04/05/13 0933  . pravastatin (PRAVACHOL) tablet 10 mg  10 mg Oral Daily Josue Hector, MD   10 mg at 04/05/13 2226  . sodium chloride 0.9 % injection 3 mL  3 mL Intravenous Q12H Roger A Arguello, PA-C   3 mL at 04/05/13 2232  . sodium chloride 0.9 % injection 3 mL  3 mL Intravenous PRN Roger A Arguello, PA-C      . triamcinolone (KENALOG) 4.163 % cream 1 application  1 application Topical PRN Roger A Arguello, PA-C        Filed Vitals:   04/06/13 1000 04/06/13 1010 04/06/13 1020 04/06/13 1028  BP: 108/46 123/42 106/49 128/58  Pulse: 81   84  Temp:      TempSrc:      Resp: 21 22 22 15   Height:      Weight:      SpO2: 100% 100% 100% 99%    PHYSICAL EXAM General: NAD  Neck: JVP 7 cm, no thyromegaly or thyroid nodule.  Lungs: Clear to auscultation bilaterally with normal respiratory effort.  CV: Nondisplaced PMI. Heart regular S1/S2, no A4/T3, 3/6 systolic murmur at RUSB. No peripheral edema. No carotid bruit. 2+ PT pulses bilaterally.  Abdomen: Soft, nontender, no hepatosplenomegaly, no distention.  Skin: Intact without lesions or rashes.    Neurologic: Alert and oriented x 3.  Psych: Normal affect.  Extremities: No clubbing or cyanosis.   TELEMETRY: Reviewed telemetry pt in NSR.   LABS: Basic Metabolic Panel:  Recent Labs  04/04/13 0345  04/05/13 0640 04/06/13 0511  NA  --   < > 138 141  K  --   < > 3.6 3.6  CL  --   < > 102 103  CO2  --   < > 24 25  GLUCOSE  --   < > 95 56*  BUN  --   < > 36* 36*  CREATININE  --   < > 1.50* 1.40*  CALCIUM  --   < > 9.9 10.0  MG 2.2  --   --   --   < > = values in this interval not displayed. Liver Function Tests:  Recent Labs  04/03/13 1805  AST 39*  ALT 18  ALKPHOS 68  BILITOT 0.3  PROT 8.9*  ALBUMIN 4.5   No results found for this basename: LIPASE, AMYLASE,  in the last 72 hours CBC:  Recent Labs  04/03/13 1501  04/05/13 0640 04/06/13 0511  WBC 7.5  < > 8.4 8.4  NEUTROABS 5.7  --   --   --   HGB 7.3 Repeated and verified X2.*  < > 9.2* 9.3*  HCT 23.4 Repeated and verified X2.*  < > 30.0* 30.3*  MCV 76.1*  < > 79.8 79.9  PLT 218.0  < > 251 252  < > = values in this interval not displayed. Cardiac Enzymes:  Recent Labs  04/04/13 0730 04/04/13 1400  TROPONINI <0.30 <0.30   BNP: No components found with this basename: POCBNP,  D-Dimer: No results found for this basename: DDIMER,  in the last 72 hours Hemoglobin A1C:  Recent Labs  04/04/13 0345  HGBA1C 6.3*   Fasting Lipid Panel:  Recent Labs  04/04/13 0345  CHOL 151  HDL 29*  LDLCALC 99  TRIG 116  CHOLHDL 5.2   Thyroid Function Tests:  Recent Labs  04/04/13 0345  TSH 2.385   Anemia Panel:  Recent  Labs  04/04/13 0345  VITAMINB12 577  FOLATE >20.0  FERRITIN 10  TIBC 636*  IRON 73  RETICCTPCT 2.5    RADIOLOGY: No results found.    ASSESSMENT AND PLAN: 1. CAD: s/p SVG-RCA in 12/05, then redo SVG-RCA in 1/11. She has been having symptoms consistent with crescendo angina recently. She had a high risk stress test (some ST elevation on CPX) done last week. She has not  had any symptoms at rest, only with exertion. When labs were done pre-cath, she was noted to have hemoglobin down to 7.3 (4 point drop).  She was admitted for transfusion and workup of anemia.  Her hemoglobin has been stable post-transfusion.  Given high risk stress test, I am going to go ahead with [facility] tomorrow.  - Continue ASA 81 mg daily - Continue Imdur 15 mg daily (low dose, has had headaches with sublingual NTG).  - Continue Zetia/pravastatin. She has not been able to tolerate higher statin dosing.  - Continue current dose of beta blocker.  - LHC/RHC tomorrow.  If she has severe lesion that needs intervention, would need DES (has had restenosis with BMS in the past).  I will contact Dr. Henrene Pastor about her GI workup. It also could be that profound anemia with more moderate CAD triggered the CPX response and her increased symptoms recently.  2. Bioprosthetic aortic valve replacement: Stable on last echo.  3. Diastolic CHF: Currently compensated by exam. EF 60-65% on echo in 4/14. She has NYHA class III symptoms that could be related to coronary ischemia and anemia as well as volume overload.  - Continue Lasix 60 mg daily (lower dose with recent worsening of renal function). Creatinine better at 1.4 today.  She will need to watch salt intake closely.  4. CKD: Creatinine improved to 1.4 today.  Will limit contrast with cath and avoid LV-gram. 5. Hyperlipidemia: Continue pravastatin/Zetia.  She has not tolerated more potent statin types or dosing.  6. Anemia: Found on pre-cath labs. Hemoglobin 7.3, down 4 pts from prior check. She denies melena/BRBPR. This certainly may be contributing to anginal and CHF-type symptoms. Given ischemia on stress test, would transfuse to keep hemoglobin > 8, presently 9 and stable s/p 1 unit PRBCs.  She has Fe deficiency anemia and hemoccult positive, though no overt bleeding.  EGD today was unremarkable.  Biopsies taken for ?celiac sprue.  She had colonoscopy < 1 year ago  that was unremarkable.  ? Small bowel AVMs.  Will ask Dr. Henrene Pastor about capsule endoscopy. As above, if she needs PCI, would need DES.  This would necessitate 1 year of Plavix.   7. Diabetes mellitus: very complex management with insulin pump, and followed by endocrinologist at [facility].  [name] 04/06/2013 12:15 PM

## 2013-04-06 NOTE — Op Note (Signed)
[facility] [place], [id number]   ENDOSCOPY PROCEDURE REPORT  PATIENT: [name], [name]  MR#: [id number] BIRTHDATE: 07-28-1950 , 27  yrs. old GENDER: Female ENDOSCOPIST: Eustace Quail, MD REFERRED BY:  [facility] PROCEDURE DATE:  04/06/2013 PROCEDURE:  EGD w/ biopsy ASA CLASS:     Class III INDICATIONS:  Anemia.   Heme positive stool. MEDICATIONS: Fentanyl 75 mcg IV and Versed 6 mg IV TOPICAL ANESTHETIC: Cetacaine Spray  DESCRIPTION OF PROCEDURE: After the risks benefits and alternatives of the procedure were thoroughly explained, informed consent was obtained.  The Pentax Gastroscope [id number] endoscope was introduced through the mouth and advanced to the third portion of the duodenum. Without limitations.  The instrument was slowly withdrawn as the mucosa was fully examined.    EXAM: The upper, middle and distal third of the esophagus were carefully inspected and no abnormalities were noted.  The z-line was well seen at the GEJ.  The endoscope was pushed into the fundus which was normal including a retroflexed view.  The antrum, gastric body, first and second and third part of the duodenum were unremarkable, except for a few small benign gastric polyps. Retroflexed views revealed no abnormalities.  Duodenal bx taken to r/o sprue.   The scope was then withdrawn from the patient and the procedure completed. COMPLICATIONS: There were no complications.  ENDOSCOPIC IMPRESSION: 1. Essentially Normal EGD  RECOMMENDATIONS: 1.  Continue PPI (for GERD) 2.  Continue iron therapy daily 3.  Await biopsy results to r/o iron absorption problem (i.e. celiac sprue) 4.  If biopsies negative, recommend you obtain a hematology consult. 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 Ok to proceed with cardiac cath, from GI perspective  REPEAT EXAM:  eSigned:  Eustace Quail, MD 04/06/2013 9:58 AM   LM:[id number] Claris Gladden, MD and  Silvano Rusk, MD

## 2013-04-06 NOTE — Progress Notes (Signed)
Patient's CBG 49. Dr. Cathren Harsh notified. Advised to suspend basal insulin rate, give D50 until CBG >100, and then restart insulin pump at .5.

## 2013-04-06 NOTE — Progress Notes (Addendum)
Patient's CBG was 56 at 1121 this morning after she had returned from her procedure. Diabetes coordinator is aware and has spoken to me about what to do to bring her sugar up. She stated that she will be up to follow up with patient. MD has been made aware of this and that the patient is now stable and CBG has not been low since. Last CBG recorded was 239. Will continue to monitor patient for further changes in condition.

## 2013-04-06 NOTE — Progress Notes (Signed)
Nutrition Consult--Brief Note  Received consult to add snacks for patient between meals. Patient states that she follows a diet for diabetes by counting carbohydrates; also follows a low sodium diet for her heart disease and limits fiber intake for gastroparesis. Patient has been educated on each of these diets and has no further education needs at this time. She has been having low blood sugars since admission and requests snacks TID between meals to help control glucoses. PO intake of meals is good; weight stable per discussion with patient.  Plan:   Add snacks TID, RN aware.  Please consult RD if further nutrition concerns arise.  Molli Barrows, RD, LDN, [place] Pager [phone] After Hours Pager [phone]

## 2013-04-06 NOTE — Progress Notes (Signed)
Report given to receiving RN. Patient is stable, with no complaints and no signs or symptoms of distress or discomfort.

## 2013-04-06 NOTE — Progress Notes (Signed)
Patient's blood glucose 105 after 1/2 amp of D50.  59mn later CBG 84. Second 1/2 amp given. CBG

## 2013-04-06 NOTE — Progress Notes (Signed)
CBG 161  61mn after second 1/2 amp of D50 given. Patient asymptomatic. Will continue to monitor patient.

## 2013-04-06 NOTE — Progress Notes (Signed)
Paged by patient regarding concern for hypoglycemia this morning.  Patient reports that she is symptomatic with current hypoglycemia and she notes that her continuous glucose monitoring (CGM) is low (which means less than 40 mg/dl).  Josph Macho, RN aware of low blood glucose and has called the GI doctor on call since patient is scheduled for an endoscopic procedure this morning and is NPO.  Josph Macho, RN reports that the GI doctor has asked that he talk with patient's endocrinologist to ask for recommendations on what needs to be done with the insulin pump.  Patient is very complex especially given her allergies to insulin and the flucatations she has been having with her blood glucose over the past few weeks.  Patient reports that she had reduced her basal rates on her own last night to prevent such an event from occuring.  Despite the changes she made on her own, her blood glucose was still low this morning.  Currently patient has placed insulin pump in suspend mode so that no insulin is being delivered by her insulin pump at this time.   After speaking with the patient, spoke with Josph Macho, RN and requested that patient be given D50 25 cc per hypoglycemia protocol for NPO patient and explained to him that patient has put her insulin pump in suspend mode therefore she is not receiving any insulin at this time.  She will still have some insulin on board due to the duration of the Apidra.  Have asked that CBG be checked 15 mins after giving the D50 and if it is still not above 70 mg/dl then give the other 25 cc of D50.  If CGM continues to show downward trend, patient may need to be placed on IVF with D5 if patient remains NPO.  Patient prefers to stay NPO so the endoscopic procedure can be done today.  Called patient back after about 5 mins of speaking with Josph Macho, RN and she reports that her glucose is reading 41 mg/dl on her CGM and is trending up.  Will continue to follow.  If there are additional questions or concerns  regarding glycemic control, please page diabetes coordinator.  Thanks, Barnie Alderman, RN, MSN, CCRN Diabetes Coordinator Inpatient Diabetes [facility] [phone] (Team Pager) [phone] (AP office) [phone] [facility] office)

## 2013-04-06 NOTE — Progress Notes (Signed)
Spoke at length to patient.  She has very complicated history.  Recently began seeing endocrinologist at [facility], Dr. Cathren Harsh.  CBG's have been low this morning during prolonged NPO status.  Insulin pump has been off since 5 am and she has been treated at least 4 times.  Insulin pump remains off.  Patient has recently been told that she likely has an allergy to insulin however seems less severe with Apidra according to her reports.  Her basal rates are 6 times higher than they were 3 years ago. She has wide fluctuations in CBG's. Called and discussed with Dr. Aundra Dubin.  Patient would benefit from endocrinology consult while in the hospital to communicate with endocrinologist at [facility].  Order received.  Called and discussed with Dr. Dwyane Dee and he states that he will consult and call MD at [facility].   Will discuss with patient and RN.

## 2013-04-07 ENCOUNTER — Encounter (HOSPITAL_COMMUNITY): Payer: Self-pay | Admitting: Internal Medicine

## 2013-04-07 ENCOUNTER — Encounter (HOSPITAL_COMMUNITY)
Admission: EM | Disposition: A | Discharge status: Home / Self Care | Payer: Self-pay | Source: Home / Self Care | Attending: Cardiology

## 2013-04-07 DIAGNOSIS — I2510 Atherosclerotic heart disease of native coronary artery without angina pectoris: Secondary | ICD-10-CM

## 2013-04-07 DIAGNOSIS — E1065 Type 1 diabetes mellitus with hyperglycemia: Secondary | ICD-10-CM

## 2013-04-07 HISTORY — PX: LEFT AND RIGHT HEART CATHETERIZATION WITH CORONARY ANGIOGRAM: SHX5449

## 2013-04-07 LAB — POCT I-STAT 3, VENOUS BLOOD GAS (G3P V)
O2 Saturation: 60 %
TCO2: 26 mmol/L (ref 0–100)
pCO2, Ven: 53.6 mmHg — ABNORMAL HIGH (ref 45.0–50.0)
pH, Ven: 7.265 (ref 7.250–7.300)
pO2, Ven: 36 mmHg (ref 30.0–45.0)

## 2013-04-07 LAB — GLUCOSE, CAPILLARY
Glucose-Capillary: 213 mg/dL — ABNORMAL HIGH (ref 70–99)
Glucose-Capillary: 194 mg/dL — ABNORMAL HIGH (ref 70–99)
Glucose-Capillary: 184 mg/dL — ABNORMAL HIGH (ref 70–99)
Glucose-Capillary: 65 mg/dL — ABNORMAL LOW (ref 70–99)
Glucose-Capillary: 76 mg/dL (ref 70–99)
Glucose-Capillary: 125 mg/dL — ABNORMAL HIGH (ref 70–99)
Glucose-Capillary: 135 mg/dL — ABNORMAL HIGH (ref 70–99)
Glucose-Capillary: 200 mg/dL — ABNORMAL HIGH (ref 70–99)
Glucose-Capillary: 285 mg/dL — ABNORMAL HIGH (ref 70–99)

## 2013-04-07 LAB — BASIC METABOLIC PANEL
BUN: 37 mg/dL — ABNORMAL HIGH (ref 6–23)
Chloride: 104 mEq/L (ref 96–112)
GFR calc Af Amer: 50 mL/min — ABNORMAL LOW (ref >90)
GFR calc non Af Amer: 43 mL/min — ABNORMAL LOW (ref >90)
Potassium: 3.9 mEq/L (ref 3.5–5.1)
Sodium: 142 mEq/L (ref 135–145)

## 2013-04-07 LAB — POCT I-STAT 3, ART BLOOD GAS (G3+)
pCO2 arterial: 46.9 mmHg — ABNORMAL HIGH (ref 35.0–45.0)
pH, Arterial: 7.344 — ABNORMAL LOW (ref 7.350–7.450)

## 2013-04-07 LAB — CBC
HCT: 28.9 % — ABNORMAL LOW (ref 36.0–46.0)
MCHC: 30.8 g/dL (ref 30.0–36.0)
Platelets: 235 10*3/uL (ref 150–400)
RDW: 17.3 % — ABNORMAL HIGH (ref 11.5–15.5)
WBC: 8.9 10*3/uL (ref 4.0–10.5)

## 2013-04-07 SURGERY — LEFT AND RIGHT HEART CATHETERIZATION WITH CORONARY ANGIOGRAM
Anesthesia: LOCAL

## 2013-04-07 MED ORDER — HEPARIN SODIUM (PORCINE) 5000 UNIT/ML IJ SOLN
5000.0000 [IU] | Freq: Three times a day (TID) | INTRAMUSCULAR | Status: DC
  Administered 2013-04-07 – 2013-04-08 (×4): 5000 [IU] via SUBCUTANEOUS
  Filled 2013-04-07 (×8): qty 1

## 2013-04-07 MED ORDER — DEXTROSE-NACL 5-0.45 % IV SOLN
INTRAVENOUS | Status: DC
  Administered 2013-04-07: 50 mL/h via INTRAVENOUS

## 2013-04-07 MED ORDER — SODIUM CHLORIDE 0.9 % IV SOLN: 1.0000 mL/kg/h | INTRAVENOUS | Status: DC

## 2013-04-07 MED ORDER — HEPARIN (PORCINE) IN NACL 2-0.9 UNIT/ML-% IJ SOLN
INTRAMUSCULAR | Status: AC
  Filled 2013-04-07: qty 1000

## 2013-04-07 MED ORDER — DEXTROSE 50 % IV SOLN
50.0000 mL | Freq: Once | INTRAVENOUS | Status: AC | PRN
  Administered 2013-04-07: 12:00:00 25 mL via INTRAVENOUS

## 2013-04-07 MED ORDER — DEXTROSE 50 % IV SOLN
INTRAVENOUS | Status: AC
  Administered 2013-04-07: 13:00:00
  Filled 2013-04-07: qty 50

## 2013-04-07 MED ORDER — SODIUM CHLORIDE 0.9 % IV SOLN: INTRAVENOUS | Status: DC

## 2013-04-07 MED ORDER — NITROGLYCERIN 0.2 MG/ML ON CALL CATH LAB
INTRAVENOUS | Status: AC
  Filled 2013-04-07: qty 1

## 2013-04-07 MED ORDER — ACETAMINOPHEN 325 MG PO TABS: 650.0000 mg | ORAL_TABLET | ORAL | Status: DC | PRN

## 2013-04-07 MED ORDER — DEXTROSE-NACL 5-0.9 % IV SOLN: INTRAVENOUS | Status: DC

## 2013-04-07 MED ORDER — MIDAZOLAM HCL 2 MG/2ML IJ SOLN
INTRAMUSCULAR | Status: AC
  Filled 2013-04-07: qty 2

## 2013-04-07 MED ORDER — DEXTROSE-NACL 5-0.9 % IV SOLN
INTRAVENOUS | Status: DC
  Administered 2013-04-07: 10:00:00 via INTRAVENOUS

## 2013-04-07 MED ORDER — LIDOCAINE HCL (PF) 1 % IJ SOLN
INTRAMUSCULAR | Status: AC
  Filled 2013-04-07: qty 30

## 2013-04-07 MED ORDER — ASPIRIN 81 MG PO CHEW
324.0000 mg | CHEWABLE_TABLET | Freq: Once | ORAL | Status: AC
  Administered 2013-04-07: 324 mg via ORAL

## 2013-04-07 MED ORDER — FENTANYL CITRATE 0.05 MG/ML IJ SOLN
INTRAMUSCULAR | Status: AC
  Filled 2013-04-07: qty 2

## 2013-04-07 MED ORDER — ONDANSETRON HCL 4 MG/2ML IJ SOLN
4.0000 mg | Freq: Four times a day (QID) | INTRAMUSCULAR | Status: DC | PRN
  Administered 2013-04-09: 10:00:00 4 mg via INTRAVENOUS
  Filled 2013-04-07: qty 2

## 2013-04-07 MED ORDER — DEXTROSE-NACL 5-0.9 % IV SOLN
INTRAVENOUS | Status: DC
  Administered 2013-04-07: 17:00:00 via INTRAVENOUS

## 2013-04-07 NOTE — CV Procedure (Signed)
   Cardiac Catheterization Procedure Note  Name: [name] [name] MRN: [id number] DOB: 15-Jul-1951  Procedure: Right Heart Cath, Selective Coronary Angiography, SVG angiography  Indication: High risk stress test (ST elevation on treadmill).  CCS 3 angina.     Procedural Details: The right groin was prepped, draped, and anesthetized with 1% lidocaine. Using the modified Seldinger technique a 5 French sheath was placed in the right femoral artery and a 7 French sheath was placed in the right femoral vein. A Swan-Ganz catheter was used for the right heart catheterization. Standard protocol was followed for recording of right heart pressures and sampling of oxygen saturations. Fick cardiac output was calculated. JL3.5 catheter was used to engage the left coronary artery.  AR1 catheter was used to engage SVG-PDA.  The SVG-PDA was difficult to engage. There were no immediate procedural complications. The patient was transferred to the post catheterization recovery area for further monitoring.  Procedural Findings: Hemodynamics RA mean 18 RV 61/18 PA 62/25, mean 43 PCWP mean 26 with prominent v waves to 43.  AO 121/63  Oxygen saturations: PA 60% AO 99%  Cardiac Output (Fick) 4.39  Cardiac Index (Fick) 2.81 PVR 3.89 WU   Coronary angiography: Coronary dominance: right  Left mainstem: Small caliber.  The catheter damped with engagement of the left main and patient developed mild chest pain with contrast injections. 70-80% distal left main with 80% ostial LAD.  The distal left main looked hazy in some views. IC nitroglycerine was used.   Left anterior descending (LAD): Ostial 80% stenosis.  Diffuse mild luminal irregularities in the rest of the LAD.    Left circumflex (LCx): No significant disease in the LCx system.   Right coronary artery (RCA): Totally occluded native RCA at ostium.  Patent SVG-PDA backfills to distal RCA and PLV.   Left ventriculography: Not done, CKD.   Final  Conclusions:  Patent SVG-PDA with 70-80% distal LM disease extending into the ostial LAD.  The catheter waveform ventricularized with engagement of the left main and the patient had chest pain with injections.  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  EF normal from prior echo in 4/14.   Difficult situation.  Patient has had 2 prior sternotomies as well as chest radiation for lymphoma.  She has iron deficiency anemia and has had transfusion this admission.  She has CKD.  A third sternotomy would be high risk and she has ascending aorta calcification.  Conversely, PCI to address left main and ostial LAD would be high risk (unprotected left main) especially given concern for iron deficiency anemia and presumed slow GI blood loss (EGD unremarkable this admission). I will have CVTS evaluate (Dr. Cyndia Bent operated on her in the past).  I will discuss the situation with him and will need to decide on revascularization approach.    Given CKD, will very gently hydrate (elevated filling pressures).  If stable tomorrow, begin diuresis.   Needs echo to reassess LV systolic function, aortic valve, and mitral valve (prominent v-waves).  Will also look for any evidence of pericardial constriction by echo.   [name] 04/07/2013 3:32 PM

## 2013-04-07 NOTE — Progress Notes (Signed)
CBG is 69. Patient stated that she feels weak and like she has no energy. MD notified. New orders given. Will administer half a amp of dextrose and send the other half down to Cath lab with her. Will continue to monitor patient and report off to receiving RN.

## 2013-04-07 NOTE — Progress Notes (Signed)
Subjective:  Followup of diabetes The patient was restarted on her insulin pump last night with a basal rate of 0.5 units per hour She was told the bolus carb ratio 1:10 and she does have this program in her pump at present but it appears that she give about 35 units for her evening meal of 70 g carbohydrate. She was previously on a 1:2 carbohydrate to insulin ratio Her blood sugars were coming down later in the night and she discontinued her pump at about 2 AM when the blood sugar was only about 190  However her blood sugars have been progressively coming down this morning and she has her continuous glucose monitoring at present. About half an hour ago her CGMS showed her glucose to be 50 and she was given dextrose 50% because she was feeling sleepy and sweaty She has not taken any insulin and has been on IV of D5 at 50 cc an hour since about 9:00 this morning  Her recent labs indicate a creatinine level of 1.5, slightly higher, has had normal thyroid functions including free T4  Objective: Vital signs in last 24 hours: Temp:  [97.7 F (36.5 C)-98.9 F (37.2 C)] 98.3 F (36.8 C) (09/16 1227) Pulse Rate:  [81-86] 86 (09/16 1227) Resp:  [18] 18 (09/16 1227) BP: (109-130)/(39-57) 109/57 mmHg (09/16 1227) SpO2:  [97 %-100 %] 100 % (09/16 1227) Weight:  [124 lb 1.6 oz (56.291 kg)] 124 lb 1.6 oz (56.291 kg) (09/16 0520) Weight change: 1 lb 11.2 oz (0.771 kg) Last BM Date: 04/06/13  Intake/Output from previous day: 09/15 0701 - 09/16 0700 In: 480 [P.O.:480] Out: 2700 [Urine:2700] Intake/Output this shift: Total I/O In: 0  Out: 450 [Urine:450]  General appearance: mild distress  Lab Results:  Recent Labs  04/06/13 0511 04/07/13 0705  WBC 8.4 8.9  HGB 9.3* 8.9*  HCT 30.3* 28.9*  PLT 252 235   BMET  Recent Labs  04/06/13 0511 04/07/13 0705  NA 141 142  K 3.6 3.9  CL 103 104  CO2 25 28  GLUCOSE 56* 119*  BUN 36* 37*  CREATININE 1.40* 1.33*  CALCIUM 10.0 9.6     Studies/Results: No results found.  Medications:  . ALPRAZolam  0.25 mg Oral QHS  . aspirin EC  81 mg Oral Daily  . calcium-vitamin D  1 tablet Oral BID  . cholecalciferol  1,000 Units Oral Daily  . dextrose      . ferrous sulfate  325 mg Oral Q breakfast   And  . docusate sodium  100 mg Oral Daily  . ezetimibe  10 mg Oral Daily  . fenofibrate  160 mg Oral Daily  . fluticasone  2 spray Each Nare Daily  . insulin pump   Subcutaneous TID AC, HS, 0200  . isosorbide mononitrate  15 mg Oral Daily  . metoprolol  50 mg Oral QHS  . metoprolol tartrate  25 mg Oral Daily  . pantoprazole  40 mg Oral Q0600  . polyethylene glycol  17 g Oral Daily  . potassium chloride SA  40 mEq Oral Daily  . pravastatin  10 mg Oral Daily    Assessment/Plan:  Extreme sensitivity to insulin again compared to previous requirement of nearly 200 units a day with insulin pump Etiology of this is not clear but may be partly related to her renal dysfunction Will also rule out hypopituitarism with a cortisol and IGF-1 level. Her thyroid functions including free T4 are normal  She appears to have  prolonged action of her insulin this morning with her taking a large bolus for her evening meal which she miscalculated on her pump  Advised her to use only a 1:10 carbohydrate ratio for meals instead of one: 2. Currently the patient is off the pump and will continue to dose unless blood sugars are over 200 She will keep her pump off until the blood blood sugar is over 200 and will restart her basal rate at 0.5 units an hour at that point   LOS: 4 days   [name] [name] 04/07/2013, 12:35 PM

## 2013-04-07 NOTE — H&P (View-Only) (Signed)
Patient ID: [name] [name], female   DOB: 06-11-1951, 62 y.o.   MRN: [id number]   Had EGD this morning, no source for GI bleeding.  No BRBPR or melena.  She has been walking halls without dyspnea or chest pain.  She is tolerating Imdur, headaches have resolved.    Intake/Output Summary (Last 24 hours) at 04/06/13 1204 Last data filed at 04/06/13 0701  Gross per 24 hour  Intake    700 ml  Output   2700 ml  Net  -2000 ml    Current Facility-Administered Medications  Medication Dose Route Frequency Provider Last Rate Last Dose  . 0.9 %  sodium chloride infusion  250 mL Intravenous PRN Roger A Arguello, PA-C      . acetaminophen (TYLENOL) tablet 650 mg  650 mg Oral Q4H PRN Roger A Arguello, PA-C      . ALPRAZolam Duanne Moron) tablet 0.25 mg  0.25 mg Oral QHS Roger A Arguello, PA-C   0.25 mg at 04/05/13 2225  . aspirin EC tablet 81 mg  81 mg Oral Daily Roger A Arguello, PA-C   81 mg at 04/05/13 0932  . calcium-vitamin D (OSCAL WITH D) 500-200 MG-UNIT per tablet 1 tablet  1 tablet Oral BID Josue Hector, MD   1 tablet at 04/05/13 2226  . cholecalciferol (VITAMIN D) tablet 1,000 Units  1,000 Units Oral Daily [name], PA-C   1,000 Units at 04/05/13 [phone]  . ferrous sulfate tablet 325 mg  325 mg Oral Q breakfast Josue Hector, MD   325 mg at 04/05/13 1941   And  . docusate sodium (COLACE) capsule 100 mg  100 mg Oral Daily Josue Hector, MD   100 mg at 04/05/13 0932  . ezetimibe (ZETIA) tablet 10 mg  10 mg Oral Daily Roger A Arguello, PA-C   10 mg at 04/05/13 2226  . fenofibrate tablet 160 mg  160 mg Oral Daily Roger A Arguello, PA-C   160 mg at 04/05/13 2225  . fluticasone (FLONASE) 50 MCG/ACT nasal spray 2 spray  2 spray Each Nare Daily Roger A Arguello, PA-C   2 spray at 04/04/13 1002  . furosemide (LASIX) tablet 60 mg  60 mg Oral Daily Roger A Arguello, PA-C   60 mg at 04/05/13 0932  . insulin pump   Subcutaneous TID AC, HS, 0200 Lendon Colonel, NP      . isosorbide mononitrate  (IMDUR) 24 hr tablet 15 mg  15 mg Oral Daily Roger A Arguello, PA-C   15 mg at 04/05/13 2226  . metoprolol (LOPRESSOR) tablet 50 mg  50 mg Oral QHS Josue Hector, MD   50 mg at 04/05/13 2227  . metoprolol tartrate (LOPRESSOR) tablet 25 mg  25 mg Oral Daily Josue Hector, MD   25 mg at 04/05/13 0933  . nitroGLYCERIN (NITROSTAT) SL tablet 0.4 mg  0.4 mg Sublingual Q5 Min x 3 PRN Roger A Arguello, PA-C      . ondansetron (ZOFRAN) injection 4 mg  4 mg Intravenous Q6H PRN Roger A Arguello, PA-C   4 mg at 04/04/13 1028  . pantoprazole (PROTONIX) EC tablet 40 mg  40 mg Oral Q0600 Amy S Esterwood, PA-C   40 mg at 04/05/13 2225  . polyethylene glycol (MIRALAX / GLYCOLAX) packet 17 g  17 g Oral Daily Josue Hector, MD   17 g at 04/05/13 0933  . potassium chloride SA (K-DUR,KLOR-CON) CR tablet 40 mEq  40 mEq Oral  Daily Roger A Arguello, PA-C   40 mEq at 04/05/13 0933  . pravastatin (PRAVACHOL) tablet 10 mg  10 mg Oral Daily Josue Hector, MD   10 mg at 04/05/13 2226  . sodium chloride 0.9 % injection 3 mL  3 mL Intravenous Q12H Roger A Arguello, PA-C   3 mL at 04/05/13 2232  . sodium chloride 0.9 % injection 3 mL  3 mL Intravenous PRN Roger A Arguello, PA-C      . triamcinolone (KENALOG) 6.226 % cream 1 application  1 application Topical PRN Roger A Arguello, PA-C        Filed Vitals:   04/06/13 1000 04/06/13 1010 04/06/13 1020 04/06/13 1028  BP: 108/46 123/42 106/49 128/58  Pulse: 81   84  Temp:      TempSrc:      Resp: 21 22 22 15   Height:      Weight:      SpO2: 100% 100% 100% 99%    PHYSICAL EXAM General: NAD  Neck: JVP 7 cm, no thyromegaly or thyroid nodule.  Lungs: Clear to auscultation bilaterally with normal respiratory effort.  CV: Nondisplaced PMI. Heart regular S1/S2, no J3/H5, 3/6 systolic murmur at RUSB. No peripheral edema. No carotid bruit. 2+ PT pulses bilaterally.  Abdomen: Soft, nontender, no hepatosplenomegaly, no distention.  Skin: Intact without lesions or rashes.    Neurologic: Alert and oriented x 3.  Psych: Normal affect.  Extremities: No clubbing or cyanosis.   TELEMETRY: Reviewed telemetry pt in NSR.   LABS: Basic Metabolic Panel:  Recent Labs  04/04/13 0345  04/05/13 0640 04/06/13 0511  NA  --   < > 138 141  K  --   < > 3.6 3.6  CL  --   < > 102 103  CO2  --   < > 24 25  GLUCOSE  --   < > 95 56*  BUN  --   < > 36* 36*  CREATININE  --   < > 1.50* 1.40*  CALCIUM  --   < > 9.9 10.0  MG 2.2  --   --   --   < > = values in this interval not displayed. Liver Function Tests:  Recent Labs  04/03/13 1805  AST 39*  ALT 18  ALKPHOS 68  BILITOT 0.3  PROT 8.9*  ALBUMIN 4.5   No results found for this basename: LIPASE, AMYLASE,  in the last 72 hours CBC:  Recent Labs  04/03/13 1501  04/05/13 0640 04/06/13 0511  WBC 7.5  < > 8.4 8.4  NEUTROABS 5.7  --   --   --   HGB 7.3 Repeated and verified X2.*  < > 9.2* 9.3*  HCT 23.4 Repeated and verified X2.*  < > 30.0* 30.3*  MCV 76.1*  < > 79.8 79.9  PLT 218.0  < > 251 252  < > = values in this interval not displayed. Cardiac Enzymes:  Recent Labs  04/04/13 0730 04/04/13 1400  TROPONINI <0.30 <0.30   BNP: No components found with this basename: POCBNP,  D-Dimer: No results found for this basename: DDIMER,  in the last 72 hours Hemoglobin A1C:  Recent Labs  04/04/13 0345  HGBA1C 6.3*   Fasting Lipid Panel:  Recent Labs  04/04/13 0345  CHOL 151  HDL 29*  LDLCALC 99  TRIG 116  CHOLHDL 5.2   Thyroid Function Tests:  Recent Labs  04/04/13 0345  TSH 2.385   Anemia Panel:  Recent  Labs  04/04/13 0345  VITAMINB12 577  FOLATE >20.0  FERRITIN 10  TIBC 636*  IRON 73  RETICCTPCT 2.5    RADIOLOGY: No results found.    ASSESSMENT AND PLAN: 1. CAD: s/p SVG-RCA in 12/05, then redo SVG-RCA in 1/11. She has been having symptoms consistent with crescendo angina recently. She had a high risk stress test (some ST elevation on CPX) done last week. She has not  had any symptoms at rest, only with exertion. When labs were done pre-cath, she was noted to have hemoglobin down to 7.3 (4 point drop).  She was admitted for transfusion and workup of anemia.  Her hemoglobin has been stable post-transfusion.  Given high risk stress test, I am going to go ahead with [facility] tomorrow.  - Continue ASA 81 mg daily - Continue Imdur 15 mg daily (low dose, has had headaches with sublingual NTG).  - Continue Zetia/pravastatin. She has not been able to tolerate higher statin dosing.  - Continue current dose of beta blocker.  - LHC/RHC tomorrow.  If she has severe lesion that needs intervention, would need DES (has had restenosis with BMS in the past).  I will contact Dr. Henrene Pastor about her GI workup. It also could be that profound anemia with more moderate CAD triggered the CPX response and her increased symptoms recently.  2. Bioprosthetic aortic valve replacement: Stable on last echo.  3. Diastolic CHF: Currently compensated by exam. EF 60-65% on echo in 4/14. She has NYHA class III symptoms that could be related to coronary ischemia and anemia as well as volume overload.  - Continue Lasix 60 mg daily (lower dose with recent worsening of renal function). Creatinine better at 1.4 today.  She will need to watch salt intake closely.  4. CKD: Creatinine improved to 1.4 today.  Will limit contrast with cath and avoid LV-gram. 5. Hyperlipidemia: Continue pravastatin/Zetia.  She has not tolerated more potent statin types or dosing.  6. Anemia: Found on pre-cath labs. Hemoglobin 7.3, down 4 pts from prior check. She denies melena/BRBPR. This certainly may be contributing to anginal and CHF-type symptoms. Given ischemia on stress test, would transfuse to keep hemoglobin > 8, presently 9 and stable s/p 1 unit PRBCs.  She has Fe deficiency anemia and hemoccult positive, though no overt bleeding.  EGD today was unremarkable.  Biopsies taken for ?celiac sprue.  She had colonoscopy < 1 year ago  that was unremarkable.  ? Small bowel AVMs.  Will ask Dr. Henrene Pastor about capsule endoscopy. As above, if she needs PCI, would need DES.  This would necessitate 1 year of Plavix.   7. Diabetes mellitus: very complex management with insulin pump, and followed by endocrinologist at [facility].  [name] 04/06/2013 12:15 PM

## 2013-04-07 NOTE — Progress Notes (Signed)
Follow up biopsies.  Duodenal biopsies normal. No evidence for celiac sprue.  Docia Chuck. Geri Seminole., M.D. [facility] Division of Gastroenterology

## 2013-04-07 NOTE — Research (Signed)
AVERT Research Study Informed Consent   Subject Name: [name] [name]  Subject met inclusion and exclusion criteria.  The informed consent form, study requirements and expectations were reviewed with the subject and questions and concerns were addressed prior to the signing of the consent form.  The subject verbalized understanding of the trail requirements.  The subject agreed to participate in the AVERT trial and signed the informed consent at 0900 on 04/07/13.  The informed consent was obtained prior to performance of any protocol-specific procedures for the subject.  A copy of the signed informed consent was given to the subject and a copy was placed in the subject's medical record.  Blossom Hoops 04/07/2013, 10:35 AM

## 2013-04-07 NOTE — Interval H&P Note (Signed)
History and Physical Interval Note:  04/07/2013 1:13 PM  [name] [name]  has presented today for surgery, with the diagnosis of chf  The various methods of treatment have been discussed with the patient and family. After consideration of risks, benefits and other options for treatment, the patient has consented to  Procedure(s): LEFT AND RIGHT HEART CATHETERIZATION WITH CORONARY ANGIOGRAM (N/A) as a surgical intervention .  The patient's history has been reviewed, patient examined, no change in status, stable for surgery.  I have reviewed the patient's chart and labs.  Questions were answered to the patient's satisfaction.     [name] [employer]

## 2013-04-07 NOTE — Progress Notes (Signed)
Utilization Review Completed.   Linde Wilensky, RN, BSN Nurse Case Manager  [phone]  

## 2013-04-07 NOTE — Consult Note (Signed)
[address]       [place] [address]             [phone]      Cardiothoracic Surgery Consultation   Reason for Consult: High grade left main and proximal LAD stenosis with high risk stress test.  Referring Physician:  Dr. [name]  [name] [name] is an 62 y.o. female.  HPI:   She has a history of diabetes, hyperlipidemia, hypertension, chronic kidney disease s/p right nephrectomy for carcinoma in 2008, and coronary disease s/p CABG x 1 with a SVG to the RCA in 06/1999 and redo CABG x1 with another SVG to the PDA and AVR with a pericardial valve in 2011. She reports that over the past couple years she has had some exertional dyspnea and since July has been having increased dyspnea and chest tightness with exertion. She has had orthopnea but denies chest discomfort at rest. She had a high risk CPX showing inferolateral ST depression and ST elevation in V1 and V2 with chest pain and severe functional limitation. Cardiac cath today showed 70-80% distal left main stenosis extending into the ostial LAD. The catheter waveform ventricularized with engagement of the LM and she had chest pain with injections. The SVG to the PDA is widely patent. She had elevated left and right heart filling pressures with moderate pulmonary hypertension. The ascending aorta and aortic arch are diffusely diseased with calcific plaque.  Past Medical History  Diagnosis Date  . Aortic stenosis      bicuspid aortic valve; mean gradient of 20 mmHg in 2/10; aortic valve   replacement surgery(19-mm Edwards pericardial valve) with a redo coronary artery bypass graft procedure      in 07/2009; postoperative Dressler's syndrome    . Coronary artery disease     status post RCA saphenous vein graft in 06/1999; multiple preceding PCIs with restenosis; total obstruction of RCA in 2006 with patent graft   . CHF (congestive heart failure)     History of CHF; recurred following AVR/CABG surgery;  intermittent PND  . Hyperlipidemia   . Hypertension   . Small bowel obstruction     Recurrent; resolved after resection of a lipoma  . Osteopenia      hip on DEXA in October 2007.  Marland Kitchen Anemia   . Carcinoma, renal cell 05/2007    Laparoscopic right nephrectomy  . NASH (nonalcoholic steatohepatitis) 1999    -biopsy in 1999  . Diabetes mellitus 03/2010     TYPE II: Hemoglobin A1c of 7.4 in  03/2010; 8.4 in 06/2010; treated with insulin pump  . Migraines   . Duodenal ulcer     Remote; H. pylori positive  . Colitis, ischemic 2008  . Gastroesophageal reflux disease     ? of GASTROPARESIS; IRRITABLE BOWEL SYNDROME  . Hodgkin's disease 1991    Mantle radiation therapy  . Exertional dyspnea     chest discomfort  . Statin intolerance   . Hypercalcemia     Ca-11.1 in 07/2011  . Vitamin D deficiency   . Costochondritis   . Insomnia   . Anemia   . Gastroparesis due to DM 05/01/2012  . PONV (postoperative nausea and vomiting)   . Hyperplastic gastric polyp 06/23/2012  . Arthritis   . Heart murmur   . Osteoporosis   . Pacemaker 08/21/2012    Medtronic Adapta L dual-chamber pacemaker-Dr. Lovena Le  . Anxiety     Past Surgical History  Procedure Laterality  Date  . Total abdominal hysterectomy w/ bilateral salpingoophorectomy  2000  . Breast excisional biopsy      benign, bilateral  . Nephrectomy  2008    Laparoscopic right nephrectomy, renal cell carcinoma  . Coronary artery bypass graft  2005, 2011    CABG-most recent SVG to RCA-12/05; RCA occluded with patent graft in 2006; Redo bypass in 07/2009  . Tumor excision  1981    removal of Hodgkins lymphoma  . Exploratory laparotomy w/ bowel resection      Small bowel resection of lipoma  . Aortic valve replacement  2011    Aortic valve replacement surgery with a 19 mm bioprosthetic device post redo CABG January 2011.  . Colonoscopy      Multiple with adenomatous polyps  . Cervical biopsy    . Liver biopsy    . Bone marrow biopsy    .  Esophagogastroduodenoscopy    . Esophagogastroduodenoscopy  06/23/2012    Procedure: ESOPHAGOGASTRODUODENOSCOPY (EGD);  Surgeon: Gatha Mayer, MD;  Location: [facility];  Service: Endoscopy;  Laterality: N/A;  . Colonoscopy  06/23/2012    Procedure: COLONOSCOPY;  Surgeon: Gatha Mayer, MD;  Location: [facility];  Service: Endoscopy;  Laterality: N/A;  [name] dilation  06/23/2012    Procedure: [name] DILATION;  Surgeon: Gatha Mayer, MD;  Location: [facility] ENDOSCOPY;  Service: Endoscopy;;  56 fr  . Pacemaker insertion  08/21/2012    Medtronic Adapta L dual-chamber pacemaker  . Esophagogastroduodenoscopy N/A 04/06/2013    Procedure: ESOPHAGOGASTRODUODENOSCOPY (EGD);  Surgeon: Irene Shipper, MD;  Location: [facility];  Service: Endoscopy;  Laterality: N/A;    Family History  Problem Relation Age of Onset  . Heart attack Mother   . Heart disease Mother   . Heart attack Father   . Diabetes Father   . Heart disease Father   . Hypertension Brother   . Glaucoma Brother   . Lung cancer Sister     meta  . Cancer Sister     breast - half sister (Paternal)  . Ovarian cancer Sister   . Cancer Sister     cervical and lung - half sister (maternal)  . Breast cancer Sister   . Colon cancer Neg Hx   . Stomach cancer Neg Hx     Social History:  reports that she has never smoked. She has never used smokeless tobacco. She reports that she does not drink alcohol or use illicit drugs.  Allergies:  Allergies  Allergen Reactions  . Clindamycin/Lincomycin Anaphylaxis and Shortness Of Breath  . Penicillins Anaphylaxis, Hives and Rash    Childhood allergy   . Gentamycin [Gentamicin] Hives, Itching and Rash    Reaction noted post loop recorder implant, where gentamycin was used for irrigation. Topical prep  . Tizanidine Other (See Comments)    Hallucinate  . Atorvastatin Other (See Comments)     increased LFT's  . Avandia [Rosiglitazone Maleate]     Congestive heart failure  . Codeine  Nausea And Vomiting  . Crestor [Rosuvastatin] Other (See Comments)    Muscle and joint pain &  . Erythromycin Nausea And Vomiting  . Metformin     Congestive heart failure  . Novolog [Insulin Aspart] Itching    Patient reports she has severe itching with Novolog insulin  . Pravastatin Other (See Comments)    In high doses (20-36m) causes muscle and joint pain   . Simvastatin     Increased LFT's  . Cephalexin Swelling and Rash  Extremities swell   . Iodinated Diagnostic Agents Other (See Comments)    PAIN DURING LYMPHANGIOGRAM '86, NO ALLERGY TO IV CONTRAST  . Neosporin [Neomycin-Bacitracin Zn-Polymyx] Rash    Medications:  I have reviewed the patient's current medications. Prior to Admission:  Prescriptions prior to admission  Medication Sig Dispense Refill  . ALPRAZolam (XANAX) 0.5 MG tablet Take 0.25 mg by mouth at bedtime as needed for sleep.      Marland Kitchen aspirin EC 81 MG tablet Take 81 mg by mouth daily.      . Calcium Carbonate-Vitamin D (RA CALCIUM PLUS VITAMIN D) 600-400 MG-UNIT per tablet Take 1 tablet by mouth 2 (two) times daily.       . cholecalciferol (VITAMIN D) 1000 UNITS tablet Take 1,000 Units by mouth daily.        Marland Kitchen ezetimibe (ZETIA) 10 MG tablet Take 10 mg by mouth at bedtime.      . fenofibrate (TRICOR) 145 MG tablet Take 145 mg by mouth at bedtime.      . fluticasone (FLONASE) 50 MCG/ACT nasal spray Place 2 sprays into the nose daily as needed for rhinitis. For nasal congestion      . furosemide (LASIX) 40 MG tablet Take 60 mg by mouth daily.      Marland Kitchen GLUCAGON EMERGENCY 1 MG injection       . Insulin Human (INSULIN PUMP) 100 unit/ml SOLN Inject into the skin continuous. Apidra Basal Rate: Midnight-1100=2.5 1100-1300=4.2 1300-1700=4.3 1700-midnight=4.5      . isosorbide mononitrate (IMDUR) 30 MG 24 hr tablet Take 15 mg by mouth at bedtime.       . metoprolol (LOPRESSOR) 50 MG tablet Take 25-50 mg by mouth 2 (two) times daily. Takes 78m in the am & 592min the pm       . Multiple Vitamins-Minerals (MULTIVITAMIN WITH MINERALS) tablet Take 1 tablet by mouth daily.        . Marland Kitchenmeprazole (PRILOSEC OTC) 20 MG tablet Take 20 mg by mouth daily.      . polyethylene glycol (MIRALAX / GLYCOLAX) packet Take 8.5 g by mouth daily as needed. For constipation      . potassium chloride SA (K-DUR,KLOR-CON) 20 MEQ tablet Take 40 mEq by mouth daily.      . pravastatin (PRAVACHOL) 10 MG tablet Take 10 mg by mouth at bedtime.      . Marland Kitchenesoximetasone (TOPICORT) 0.05 % cream Apply 1 application topically 2 (two) times daily as needed. When skin starts peeling      . NITROLINGUAL 0.4 MG/SPRAY spray USE ONE SPRAY UNDER TONGUE EVERY 5 MINUTES AS NEEDED FOR CHEST PAIN. MAY REPEAT THREE TIMES  4.9 g  3   Scheduled: . ALPRAZolam  0.25 mg Oral QHS  . aspirin EC  81 mg Oral Daily  . calcium-vitamin D  1 tablet Oral BID  . cholecalciferol  1,000 Units Oral Daily  . ferrous sulfate  325 mg Oral Q breakfast   And  . docusate sodium  100 mg Oral Daily  . ezetimibe  10 mg Oral Daily  . fenofibrate  160 mg Oral Daily  . fluticasone  2 spray Each Nare Daily  . heparin  5,000 Units Subcutaneous Q8H  . insulin pump   Subcutaneous TID AC, HS, 0200  . isosorbide mononitrate  15 mg Oral Daily  . metoprolol  50 mg Oral QHS  . metoprolol tartrate  25 mg Oral Daily  . pantoprazole  40 mg Oral Q0600  . polyethylene glycol  17 g Oral Daily  . potassium chloride SA  40 mEq Oral Daily  . pravastatin  10 mg Oral Daily   Continuous:  NWG:[id number], nitroGLYCERIN, ondansetron (ZOFRAN) IV, triamcinolone  Results for orders placed during the hospital encounter of 04/03/13 (from the past 48 hour(s))  GLUCOSE, CAPILLARY     Status: Abnormal   Collection Time    04/05/13  9:55 PM      Result Value Range   Glucose-Capillary 221 (*) 70 - 99 mg/dL  GLUCOSE, CAPILLARY     Status: Abnormal   Collection Time    04/06/13  1:59 AM      Result Value Range   Glucose-Capillary 169 (*) 70 - 99 mg/dL    Comment 1 Notify RN     Comment 2 Documented in Chart    CBC     Status: Abnormal   Collection Time    04/06/13  5:11 AM      Result Value Range   WBC 8.4  4.0 - 10.5 K/uL   RBC 3.79 (*) 3.87 - 5.11 MIL/uL   Hemoglobin 9.3 (*) 12.0 - 15.0 g/dL   HCT 30.3 (*) 36.0 - 46.0 %   MCV 79.9  78.0 - 100.0 fL   MCH 24.5 (*) 26.0 - 34.0 pg   MCHC 30.7  30.0 - 36.0 g/dL   RDW 16.9 (*) 11.5 - 15.5 %   Platelets 252  150 - 400 K/uL  BASIC METABOLIC PANEL     Status: Abnormal   Collection Time    04/06/13  5:11 AM      Result Value Range   Sodium 141  135 - 145 mEq/L   Potassium 3.6  3.5 - 5.1 mEq/L   Chloride 103  96 - 112 mEq/L   CO2 25  19 - 32 mEq/L   Glucose, Bld 56 (*) 70 - 99 mg/dL   BUN 36 (*) 6 - 23 mg/dL   Creatinine, Ser 1.40 (*) 0.50 - 1.10 mg/dL   Calcium 10.0  8.4 - 10.5 mg/dL   GFR calc non Af Amer 40 (*) >90 mL/min   GFR calc Af Amer 47 (*) >90 mL/min   Comment: (NOTE)     The eGFR has been calculated using the CKD EPI equation.     This calculation has not been validated in all clinical situations.     eGFR's persistently <90 mL/min signify possible Chronic Kidney     Disease.  GLUCOSE, CAPILLARY     Status: Abnormal   Collection Time    04/06/13  5:17 AM      Result Value Range   Glucose-Capillary 49 (*) 70 - 99 mg/dL   Comment 1 Notify RN     Comment 2 Documented in Chart    GLUCOSE, CAPILLARY     Status: Abnormal   Collection Time    04/06/13  6:09 AM      Result Value Range   Glucose-Capillary 105 (*) 70 - 99 mg/dL  GLUCOSE, CAPILLARY     Status: None   Collection Time    04/06/13  6:22 AM      Result Value Range   Glucose-Capillary 84  70 - 99 mg/dL  GLUCOSE, CAPILLARY     Status: Abnormal   Collection Time    04/06/13  6:39 AM      Result Value Range   Glucose-Capillary 161 (*) 70 - 99 mg/dL  GLUCOSE, CAPILLARY     Status: Abnormal   Collection Time  04/06/13  7:09 AM      Result Value Range   Glucose-Capillary 119 (*) 70 - 99 mg/dL  GLUCOSE,  CAPILLARY     Status: Abnormal   Collection Time    04/06/13  8:04 AM      Result Value Range   Glucose-Capillary 65 (*) 70 - 99 mg/dL  GLUCOSE, CAPILLARY     Status: Abnormal   Collection Time    04/06/13  8:28 AM      Result Value Range   Glucose-Capillary 107 (*) 70 - 99 mg/dL  GLUCOSE, CAPILLARY     Status: Abnormal   Collection Time    04/06/13  9:13 AM      Result Value Range   Glucose-Capillary 213 (*) 70 - 99 mg/dL  GLUCOSE, CAPILLARY     Status: Abnormal   Collection Time    04/06/13 11:21 AM      Result Value Range   Glucose-Capillary 56 (*) 70 - 99 mg/dL  GLUCOSE, CAPILLARY     Status: None   Collection Time    04/06/13 11:45 AM      Result Value Range   Glucose-Capillary 73  70 - 99 mg/dL  GLUCOSE, CAPILLARY     Status: Abnormal   Collection Time    04/06/13  2:42 PM      Result Value Range   Glucose-Capillary 219 (*) 70 - 99 mg/dL  GLUCOSE, CAPILLARY     Status: Abnormal   Collection Time    04/06/13  3:58 PM      Result Value Range   Glucose-Capillary 239 (*) 70 - 99 mg/dL   Comment 1 Notify RN    GLUCOSE, CAPILLARY     Status: Abnormal   Collection Time    04/06/13  6:29 PM      Result Value Range   Glucose-Capillary 276 (*) 70 - 99 mg/dL   Comment 1 Notify RN    GLUCOSE, CAPILLARY     Status: Abnormal   Collection Time    04/06/13  9:35 PM      Result Value Range   Glucose-Capillary 213 (*) 70 - 99 mg/dL  GLUCOSE, CAPILLARY     Status: Abnormal   Collection Time    04/07/13  1:52 AM      Result Value Range   Glucose-Capillary 194 (*) 70 - 99 mg/dL   Comment 1 Notify RN     Comment 2 Documented in Chart    GLUCOSE, CAPILLARY     Status: Abnormal   Collection Time    04/07/13  3:19 AM      Result Value Range   Glucose-Capillary 184 (*) 70 - 99 mg/dL  GLUCOSE, CAPILLARY     Status: Abnormal   Collection Time    04/07/13  6:05 AM      Result Value Range   Glucose-Capillary 165 (*) 70 - 99 mg/dL  CBC     Status: Abnormal   Collection Time     04/07/13  7:05 AM      Result Value Range   WBC 8.9  4.0 - 10.5 K/uL   RBC 3.59 (*) 3.87 - 5.11 MIL/uL   Hemoglobin 8.9 (*) 12.0 - 15.0 g/dL   HCT 28.9 (*) 36.0 - 46.0 %   MCV 80.5  78.0 - 100.0 fL   MCH 24.8 (*) 26.0 - 34.0 pg   MCHC 30.8  30.0 - 36.0 g/dL   RDW 17.3 (*) 11.5 - 15.5 %   Platelets  235  150 - 400 K/uL  BASIC METABOLIC PANEL     Status: Abnormal   Collection Time    04/07/13  7:05 AM      Result Value Range   Sodium 142  135 - 145 mEq/L   Potassium 3.9  3.5 - 5.1 mEq/L   Chloride 104  96 - 112 mEq/L   CO2 28  19 - 32 mEq/L   Glucose, Bld 119 (*) 70 - 99 mg/dL   BUN 37 (*) 6 - 23 mg/dL   Creatinine, Ser 1.33 (*) 0.50 - 1.10 mg/dL   Calcium 9.6  8.4 - 10.5 mg/dL   GFR calc non Af Amer 43 (*) >90 mL/min   GFR calc Af Amer 50 (*) >90 mL/min   Comment: (NOTE)     The eGFR has been calculated using the CKD EPI equation.     This calculation has not been validated in all clinical situations.     eGFR's persistently <90 mL/min signify possible Chronic Kidney     Disease.  GLUCOSE, CAPILLARY     Status: None   Collection Time    04/07/13  8:00 AM      Result Value Range   Glucose-Capillary 89  70 - 99 mg/dL  GLUCOSE, CAPILLARY     Status: Abnormal   Collection Time    04/07/13 10:03 AM      Result Value Range   Glucose-Capillary 65 (*) 70 - 99 mg/dL  GLUCOSE, CAPILLARY     Status: None   Collection Time    04/07/13 11:04 AM      Result Value Range   Glucose-Capillary 76  70 - 99 mg/dL   Comment 1 Notify RN    GLUCOSE, CAPILLARY     Status: Abnormal   Collection Time    04/07/13 12:11 PM      Result Value Range   Glucose-Capillary 69 (*) 70 - 99 mg/dL  GLUCOSE, CAPILLARY     Status: Abnormal   Collection Time    04/07/13  1:03 PM      Result Value Range   Glucose-Capillary 125 (*) 70 - 99 mg/dL   Comment 1 Documented in Chart     Comment 2 Notify RN    POCT I-STAT 3, BLOOD GAS (G3+)     Status: Abnormal   Collection Time    04/07/13  1:40 PM       Result Value Range   pH, Arterial 7.344 (*) 7.350 - 7.450   pCO2 arterial 46.9 (*) 35.0 - 45.0 mmHg   pO2, Arterial 171.0 (*) 80.0 - 100.0 mmHg   Bicarbonate 25.5 (*) 20.0 - 24.0 mEq/L   TCO2 27  0 - 100 mmol/L   O2 Saturation 99.0     Sample type ARTERIAL    POCT I-STAT 3, BLOOD GAS (G3P V)     Status: Abnormal   Collection Time    04/07/13  1:40 PM      Result Value Range   pH, Ven 7.265  7.250 - 7.300   pCO2, Ven 53.6 (*) 45.0 - 50.0 mmHg   pO2, Ven 36.0  30.0 - 45.0 mmHg   Bicarbonate 24.3 (*) 20.0 - 24.0 mEq/L   TCO2 26  0 - 100 mmol/L   O2 Saturation 60.0     Acid-base deficit 3.0 (*) 0.0 - 2.0 mmol/L   Sample type VENOUS    GLUCOSE, CAPILLARY     Status: Abnormal   Collection Time  04/07/13  2:51 PM      Result Value Range   Glucose-Capillary 65 (*) 70 - 99 mg/dL  GLUCOSE, CAPILLARY     Status: Abnormal   Collection Time    04/07/13  3:18 PM      Result Value Range   Glucose-Capillary 135 (*) 70 - 99 mg/dL  GLUCOSE, CAPILLARY     Status: None   Collection Time    04/07/13  4:14 PM      Result Value Range   Glucose-Capillary 80  70 - 99 mg/dL  GLUCOSE, CAPILLARY     Status: Abnormal   Collection Time    04/07/13  7:01 PM      Result Value Range   Glucose-Capillary 200 (*) 70 - 99 mg/dL    No results found.  Review of Systems  Constitutional: Positive for malaise/fatigue. Negative for fever, chills, weight loss and diaphoresis.  HENT: Negative.   Eyes: Negative.   Respiratory: Positive for shortness of breath. Negative for cough and sputum production.   Cardiovascular: Positive for chest pain and orthopnea. Negative for palpitations, leg swelling and PND.  Gastrointestinal: Negative for nausea, vomiting, abdominal pain, diarrhea, constipation, blood in stool and melena.  Genitourinary: Negative.   Musculoskeletal: Negative.   Skin: Negative.   Neurological: Negative.   Endo/Heme/Allergies: Negative.   Psychiatric/Behavioral: Negative.    Blood pressure  138/58, pulse 91, temperature 97.9 F (36.6 C), temperature source Oral, resp. rate 18, height 5' 2"  (1.575 m), weight 56.291 kg (124 lb 1.6 oz), SpO2 100.00%. Physical Exam  Constitutional: She is oriented to person, place, and time. She appears well-developed and well-nourished. No distress.  HENT:  Head: Normocephalic and atraumatic.  Mouth/Throat: Oropharynx is clear and moist.  Eyes: Conjunctivae and EOM are normal. Pupils are equal, round, and reactive to light.  Neck: Normal range of motion. Neck supple. No JVD present. No tracheal deviation present. Thyromegaly present.  Cardiovascular: Normal rate, regular rhythm and intact distal pulses.   Murmur heard. 2/6 systolic murmur over aorta. 1/6 systolic murmur at apex radiating to axilla.  Respiratory: Effort normal and breath sounds normal. No respiratory distress.  Old sternotomy scar.  GI: Soft. Bowel sounds are normal. She exhibits no distension and no mass. There is no tenderness.  Musculoskeletal: She exhibits no edema.  Lymphadenopathy:    She has no cervical adenopathy.  Neurological: She is alert and oriented to person, place, and time. She has normal strength. No cranial nerve deficit or sensory deficit.  Skin: Skin is warm.  Psychiatric: She has a normal mood and affect.   Cardiac Catheterization Procedure Note  Name: PALLAS WAHLERT MRN: [id number] DOB: Jul 30, 1950  Procedure: Right Heart Cath, Selective Coronary Angiography, SVG angiography  Indication: High risk stress test (ST elevation on treadmill).  CCS 3 angina.               Procedural Details: The right groin was prepped, draped, and anesthetized with 1% lidocaine. Using the modified Seldinger technique a 5 French sheath was placed in the right femoral artery and a 7 French sheath was placed in the right femoral vein. A Swan-Ganz catheter was used for the right heart catheterization. Standard protocol was followed for recording of right heart pressures and  sampling of oxygen saturations. Fick cardiac output was calculated. JL3.5 catheter was used to engage the left coronary artery.  AR1 catheter was used to engage SVG-PDA.  The SVG-PDA was difficult to engage. There were no immediate procedural complications. The patient  was transferred to the post catheterization recovery area for further monitoring.  Procedural Findings: Hemodynamics RA mean 18 RV 61/18 PA 62/25, mean 43 PCWP mean 26 with prominent v waves to 43.  AO 121/63  Oxygen saturations: PA 60% AO 99%  Cardiac Output (Fick) 4.39  Cardiac Index (Fick) 2.81 PVR 3.89 WU   Coronary angiography: Coronary dominance: right  Left mainstem: Small caliber.  The catheter damped with engagement of the left main and patient developed mild chest pain with contrast injections. 70-80% distal left main with 80% ostial LAD.  The distal left main looked hazy in some views. IC nitroglycerine was used.    Left anterior descending (LAD): Ostial 80% stenosis.  Diffuse mild luminal irregularities in the rest of the LAD.     Left circumflex (LCx): No significant disease in the LCx system.    Right coronary artery (RCA): Totally occluded native RCA at ostium.  Patent SVG-PDA backfills to distal RCA and PLV.    Left ventriculography: Not done, CKD.    Final Conclusions:  Patent SVG-PDA with 70-80% distal LM disease extending into the ostial LAD.  The catheter waveform ventricularized with engagement of the left main and the patient had chest pain with injections.  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  EF normal from prior echo in 4/14.   Difficult situation.  Patient has had 2 prior sternotomies as well as chest radiation for lymphoma.  She has iron deficiency anemia and has had transfusion this admission.  She has CKD.  A third sternotomy would be high risk and she has ascending aorta  calcification.  Conversely, PCI to address left main and ostial LAD would be high risk (unprotected left main) especially given concern for iron deficiency anemia and presumed slow GI blood loss (EGD unremarkable this admission). I will have CVTS evaluate (Dr. Cyndia Bent operated on her in the past).  I will discuss the situation with him and will need to decide on revascularization approach.    Given CKD, will very gently hydrate (elevated filling pressures).  If stable tomorrow, begin diuresis.   Needs echo to reassess LV systolic function, aortic valve, and mitral valve (prominent v-waves).  Will also look for any evidence of pericardial constriction by echo.   [name] 04/07/2013     *[address] [place], [place] [address]                         [phone]   ------------------------------------------------------------ Transthoracic Echocardiography  Patient:    Jaloni, Sorber MR #:       [id number] Study Date: 10/24/2012 Gender:     F Age:        73 Height:     160cm Weight:     58.1kg BSA:        1.45m2 Pt. Status: Room:    SONOGRAPHER  LKonrad Dolores ATTENDING    Rothbart, RLamarr Lulas   Rothbart, Robert  PERFORMING   LAneta MinsPenn cc:  ------------------------------------------------------------ LV EF: 60% -   65%  ------------------------------------------------------------ Indications:  Dyspnea 786.09.  ------------------------------------------------------------ History:   PMH:   Congestive heart failure. Complications of prior valve surgery.  PMH:  AVR Hyperlipidemia, ASCVD, Sic Sinus syndrome, Hodgkins disease  Risk factors: Hypertension. Diabetes mellitus.  ------------------------------------------------------------ Study Conclusions  - Left ventricle: The cavity size was normal. Wall thickness   was normal. Systolic function was  normal. The estimated   ejection fraction was in the range of 60% to 65%. - Aortic valve: AV prosthesis opens well. Peak and mean   gradients through the valve are 22 and 15 mm Hg   respectively. - Mitral valve: Mild regurgitation. - Pulmonary arteries: PA peak pressure: 77m Hg (S).  ------------------------------------------------------------ Labs, prior tests, procedures, and surgery: Permanent pacemaker system implantation.  Coronary artery bypass grafting. Transthoracic echocardiography.  M-mode, complete 2D, spectral Doppler, and color Doppler.  Height:  Height: 160cm. Height: 63in.  Weight:  Weight: 58.1kg. Weight: 127.7lb.  Body mass index:  BMI: 22.7kg/m^2.  Body surface area:    BSA: 1.611m.  Patient status:  Inpatient.  ------------------------------------------------------------  ------------------------------------------------------------ Left ventricle:  The cavity size was normal. Wall thickness was normal. Systolic function was normal. The estimated ejection fraction was in the range of 60% to 65%.  ------------------------------------------------------------ Aortic valve:  AV prosthesis opens well. Peak and mean gradients through the valve are 22 and 15 mm Hg respectively.  Doppler:     VTI ratio of LVOT to aortic valve: 0.34. Peak velocity ratio of LVOT to aortic valve: 0.3.    Mean gradient: 1480mg (S). Peak gradient: 20m8m (S).  ------------------------------------------------------------ Mitral valve:   Calcified annulus. Mildly thickened leaflets .  Doppler:   Mild regurgitation.  ------------------------------------------------------------ Right ventricle:  The cavity size was normal. Wall thickness was normal. Pacer wire or catheter noted in right ventricle. Systolic function was normal.  ------------------------------------------------------------ Pulmonic valve:    Structurally normal valve.   Cusp separation was normal.  Doppler:   Transvalvular velocity was within the normal range.  Trivial regurgitation.  ------------------------------------------------------------ Tricuspid valve:   Structurally normal valve.   Leaflet separation was normal.  Doppler:  Transvalvular velocity was within the normal range.  Mild regurgitation.  ------------------------------------------------------------ Pericardium:  There was no pericardial effusion.  ------------------------------------------------------------ Systemic veins: Inferior vena cava: The vessel was normal in size. Diameter: 15.88mm75m-----------------------------------------------------------  2D measurements        Normal  Doppler measurements   Normal IVC                            Main pulmonary Diam      15.88 mm     ------  artery Left ventricle                 Pressure, S    39 mm   =30 LVID ED,   39.5 mm     43-52                     Hg chord,                         LVOT PLAX                           Peak vel, S  66.2 cm/s ------ LVID ES,   30.6 mm     23-38   VTI, S       16.8 cm   ------  chord,                         Aortic valve PLAX                           Peak vel, S   223 cm/s ------ FS,          23 %      >29     Mean vel, S   174 cm/s ------ chord,                         VTI, S         49 cm   ------ PLAX                           Mean           14 mm   ------ LVPW, ED   9.75 mm     ------  gradient, S       Hg IVS/LVPW   1.05        <1.3    Peak           20 mm   ------ ratio, ED                      gradient, S       Hg Ventricular septum             VTI ratio    0.34      ------ IVS, ED   10.26 mm     ------  LVOT/AV Aorta                          Peak vel      0.3      ------ Root         26 mm     ------  ratio, diam, ED                       LVOT/AV Left atrium                    Mitral valve AP dim    42.32 mm     ------  Regurg alias 38.5 cm/s ------ AP dim     2.65 cm/m^2 <2.2    vel, PISA index                          Max  regurg    500 cm/s ------ Right ventricle                vel RVID ED,   29.3 mm     19-38   Regurg VTI    160 cm   ------ PLAX                           ERO, PISA    0.17 cm^2 ------                                Regurg vol,    27 ml   ------ M-mode measurements    Normal  PISA Aorta  Tricuspid valve Root         25 mm     20-37   Regurg peak   269 cm/s ------ diam, ED                       vel Left atrium                    Peak RV-RA     29 mm   ------ AP dim     3.13 cm/m^2 <2.2    gradient, S       Hg index, ES                      Systemic veins LA/Ao         2        ------  Estimated      10 mm   ------ root                           CVP               Hg ratio                          Right ventricle                                Pressure, S    39 mm   <30                                                  Hg   ------------------------------------------------------------ Prepared and Electronically Authenticated by  Dorris Carnes 2014-04-04T16:23:00.107   Assessment/Plan:  She has high grade distal left main and ostial LAD stenosis with significant anginal symptoms and a high risk CPX. Unfortunately she has already had two prior sternotomies and now has a diffusely calcified ascending aorta and arch on fluoroscopy today and this can also be seen on her CT scan of the neck in January 2014. I think the degree of calcification would preclude cannulation and cross clamping of the aorta. This is a 3 rd time redo operation and can't be done off- pump. She had very dense adhesions at her previous surgery as well as significant aortic disease that made that surgery very difficult. The calcification of her aorta extends out into the aortic arch so I don't think I could just replace the ascending aorta. I would discuss her with interventional cardiology to see what they think about stenting the LM and LAD. I am sure this would be risky too but probably less than surgical  revascularization in this patient. I discussed all of this with the patient and her husband.  Ashlan Dignan K 04/07/2013, 9:00 PM

## 2013-04-07 NOTE — Progress Notes (Signed)
Patient's CBG is 89. Notified Endocrinologist about CBG levels trending down and that her insulin pump has been turned off since 0200. Patient is asymptomatic and no signs or symptoms of distress or discomfort. New orders given. Will continue to monitor patient for further changes in condition.

## 2013-04-07 NOTE — Progress Notes (Signed)
Was paged this am by RN, Lonzo Cloud with report of insulin pump restarted at 10 pm last evening with glucose of 220 mg/dL and within 4 hrs of basal rate of  0.25 units/hr and within 4 hrs, her glucose was dropping rapidly.  Pt stopped her insulin pump and continued to drop after pump was disconnected. Have notified Rosaria Ferries, PA and RN contacted Dr. Marigene Ehlers and given order of D5NS at 50 ml/hr.  Pt also given 4 oz OJ at 10 am, and glucose holding now at 86 mg/dL. Pt for work - in cath today and is NPO. Thank you, Rosita Kea, RN, CNS, Diabetes Coordinator [phone])

## 2013-04-08 ENCOUNTER — Telehealth: Payer: Self-pay | Admitting: *Deleted

## 2013-04-08 DIAGNOSIS — I059 Rheumatic mitral valve disease, unspecified: Secondary | ICD-10-CM

## 2013-04-08 LAB — GLUCOSE, CAPILLARY
Glucose-Capillary: 67 mg/dL — ABNORMAL LOW (ref 70–99)
Glucose-Capillary: 102 mg/dL — ABNORMAL HIGH (ref 70–99)
Glucose-Capillary: 152 mg/dL — ABNORMAL HIGH (ref 70–99)
Glucose-Capillary: 332 mg/dL — ABNORMAL HIGH (ref 70–99)
Glucose-Capillary: 255 mg/dL — ABNORMAL HIGH (ref 70–99)
Glucose-Capillary: 265 mg/dL — ABNORMAL HIGH (ref 70–99)
Glucose-Capillary: 237 mg/dL — ABNORMAL HIGH (ref 70–99)

## 2013-04-08 LAB — CBC
MCH: 25.4 pg — ABNORMAL LOW (ref 26.0–34.0)
MCHC: 31.6 g/dL (ref 30.0–36.0)
MCV: 80.2 fL (ref 78.0–100.0)
Platelets: 219 10*3/uL (ref 150–400)
RDW: 18.1 % — ABNORMAL HIGH (ref 11.5–15.5)

## 2013-04-08 LAB — BASIC METABOLIC PANEL
Calcium: 9.4 mg/dL (ref 8.4–10.5)
Creatinine, Ser: 1.2 mg/dL — ABNORMAL HIGH (ref 0.50–1.10)
GFR calc non Af Amer: 48 mL/min — ABNORMAL LOW (ref >90)
Sodium: 139 mEq/L (ref 135–145)

## 2013-04-08 LAB — CORTISOL-AM, BLOOD: Cortisol - AM: 13.3 ug/dL (ref 4.3–22.4)

## 2013-04-08 LAB — INSULIN-LIKE GROWTH FACTOR: Somatomedin C: 97 ng/mL (ref 31–179)

## 2013-04-08 MED ORDER — CLOPIDOGREL BISULFATE 75 MG PO TABS
75.0000 mg | ORAL_TABLET | Freq: Every day | ORAL | Status: DC
  Administered 2013-04-09 – 2013-04-10 (×2): 75 mg via ORAL
  Filled 2013-04-08 (×3): qty 1

## 2013-04-08 MED ORDER — SODIUM CHLORIDE 0.9 % IV SOLN: INTRAVENOUS | Status: DC

## 2013-04-08 MED ORDER — DEXTROSE-NACL 5-0.45 % IV SOLN: INTRAVENOUS | Status: DC

## 2013-04-08 MED ORDER — SODIUM CHLORIDE 0.45 % IV SOLN: INTRAVENOUS | Status: DC

## 2013-04-08 MED ORDER — SODIUM CHLORIDE 0.9 % IV SOLN
INTRAVENOUS | Status: DC
  Filled 2013-04-08 (×3): qty 100

## 2013-04-08 MED ORDER — ASPIRIN 81 MG PO CHEW
324.0000 mg | CHEWABLE_TABLET | ORAL | Status: AC
  Administered 2013-04-09: 324 mg via ORAL
  Filled 2013-04-08: qty 4

## 2013-04-08 MED ORDER — ALPRAZOLAM 0.25 MG PO TABS
0.2500 mg | ORAL_TABLET | Freq: Three times a day (TID) | ORAL | Status: DC | PRN
  Administered 2013-04-08 – 2013-04-10 (×4): 0.25 mg via ORAL
  Filled 2013-04-08 (×4): qty 1

## 2013-04-08 MED ORDER — DEXTROSE 50 % IV SOLN: 25.0000 mL | INTRAVENOUS | Status: DC | PRN

## 2013-04-08 MED ORDER — FUROSEMIDE 10 MG/ML IJ SOLN
40.0000 mg | Freq: Two times a day (BID) | INTRAMUSCULAR | Status: AC
  Administered 2013-04-08 (×2): 40 mg via INTRAVENOUS
  Filled 2013-04-08: qty 4

## 2013-04-08 MED ORDER — CLOPIDOGREL BISULFATE 300 MG PO TABS
300.0000 mg | ORAL_TABLET | Freq: Once | ORAL | Status: AC
  Administered 2013-04-08: 11:00:00 300 mg via ORAL
  Filled 2013-04-08 (×2): qty 1

## 2013-04-08 MED ORDER — ASPIRIN EC 81 MG PO TBEC
81.0000 mg | DELAYED_RELEASE_TABLET | Freq: Every day | ORAL | Status: DC
  Administered 2013-04-10 – 2013-04-11 (×2): 81 mg via ORAL
  Filled 2013-04-08 (×2): qty 1

## 2013-04-08 NOTE — Progress Notes (Addendum)
Patient ID: [name] [name], female   DOB: 05/09/1951, 62 y.o.   MRN: [id number]  Subjective:  Followup of diabetes/insulin pump management  The patient has had difficulty with getting her blood sugars seemingly controlled Yesterday she experienced hypoglycemia and was taken off her pump between 2 AM and about 9 PM last night because of prolonged low normal blood sugars with the lowest reading about 60. This was apparently because she had setting of carbohydrate coverage of 1:2 instead of 1:10 after 4 PM The patient was restarted on her insulin pump last night with a basal rate of 0.5 units per hour when her blood sugar was 285 late evening However on her glucose sensor it appears that she had blood sugar down to about 50 at about 5 AM and her glucose before breakfast was 69  She did use a bolus for breakfast with a carb ratio 1:10 and she does have this program in her pump at present  However blood sugar was 332 about 2 hours later despite her having enough protein and blood sugar despite correction is still in the 200 range. She feels fairly good She was previously on a 1:2 carbohydrate to insulin ratio and basal rates were as high as 4.5 during the day Her glucose sensor is reading fairly close to the actual reading at noon today although it was relatively low prior to that when the blood sugar was arising  CBG (last 3)   Recent Labs  04/08/13 1007 04/08/13 1105 04/08/13 1200  GLUCAP 332* 255* 265*     Objective: Vital signs in last 24 hours: Temp:  [97.9 F (36.6 C)-98.8 F (37.1 C)] 98.5 F (36.9 C) (09/17 0455) Pulse Rate:  [72-96] 78 (09/17 1055) Resp:  [18-20] 18 (09/17 0455) BP: (90-146)/(46-79) 102/50 mmHg (09/17 1055) SpO2:  [95 %-100 %] 95 % (09/17 0455) Weight:  [124 lb 9 oz (56.5 kg)] 124 lb 9 oz (56.5 kg) (09/17 0455) Weight change: 7.4 oz (0.209 kg) Last BM Date: 04/07/13  Intake/Output from previous day: 09/16 0701 - 09/17 0700 In: 1280 [P.O.:1280] Out:  1400 [Urine:1400] Intake/Output this shift: Total I/O In: 480 [P.O.:480] Out: 1050 [Urine:1050]  General appearance: mild distress  Lab Results:  Recent Labs  04/07/13 0705 04/08/13 0540  WBC 8.9 8.8  HGB 8.9* 8.6*  HCT 28.9* 27.2*  PLT 235 219   BMET  Recent Labs  04/07/13 0705 04/08/13 0540  NA 142 139  K 3.9 4.1  CL 104 105  CO2 28 22  GLUCOSE 119* 67*  BUN 37* 27*  CREATININE 1.33* 1.20*  CALCIUM 9.6 9.4    Studies/Results: No results found.  Medications:  . [START ON 04/09/2013] aspirin  324 mg Oral Pre-Cath  . [START ON 04/10/2013] aspirin EC  81 mg Oral Daily  . calcium-vitamin D  1 tablet Oral BID  . cholecalciferol  1,000 Units Oral Daily  . [START ON 04/09/2013] clopidogrel  75 mg Oral Q breakfast  . ferrous sulfate  325 mg Oral Q breakfast   And  . docusate sodium  100 mg Oral Daily  . ezetimibe  10 mg Oral Daily  . fenofibrate  160 mg Oral Daily  . fluticasone  2 spray Each Nare Daily  . furosemide  40 mg Intravenous BID  . heparin  5,000 Units Subcutaneous Q8H  . insulin pump   Subcutaneous TID AC, HS, 0200  . isosorbide mononitrate  15 mg Oral Daily  . metoprolol  50 mg Oral QHS  .  metoprolol tartrate  25 mg Oral Daily  . pantoprazole  40 mg Oral Q0600  . polyethylene glycol  17 g Oral Daily  . potassium chloride SA  40 mEq Oral Daily  . pravastatin  10 mg Oral Daily    Assessment/Plan:  She is sensitive insulin  compared to previous requirement of nearly 200 units a day with insulin pump  Currently appears to be requiring less insulin overnight as her blood sugar came down excessively with the 0.5 basal rate overnight even without a correction bolus for a glucose of 285 Blood sugar is now increasing progressively during the day especially after breakfast She is using the continuous glucose sensor and this will help in monitoring  NEW basal rate settings will be 0.2 between 9 PM and 7 AM and 1.5 between 7 AM and 9 PM She will continue  1:10 carbohydrate coverage and 1:50 correction factor until tomorrow and this will be adjusted as needed She can be treated with a glucomander and insulin drip around the time of her cardiac catheterization tomorrow   LOS: 5 days   [facility] 04/08/2013, 12:46 PM  Addendum: Discussed in detail the insulin drip with the pharmacist and reassured her that Apidra will be fine in the insulin drip for glucose stabilizer. Will start a drip at 1 unit an hour about 2 hours before the procedure time  9 PM: Patient's blood sugars are running over 200 and now 270. She will continue a 1.5 and her basal until midnight and then 0.5. 7 AM basal will be 2.0 and will change her correction factor to 1:25, she will bolus now

## 2013-04-08 NOTE — Progress Notes (Signed)
Following adjustments made for pt's insulin pump, pt reported per daughter that she did not know how to change the basal rates at certain times, as she has only in the past had 1 basal rate X 24 hrs.  Paged Dr Dwyane Dee and spoke with Bernie Covey who will come back over to see pt and assist with pump adjustments/times. Dr Dwyane Dee reported to me that he had changed her basal rates to 0900-1900  0.2 units/hr.                                         1900 to 0700 to 1.5 units/hr.  Thank you, Rosita Kea, RN, CNS, Diabetes Coordinator [phone])

## 2013-04-08 NOTE — Progress Notes (Addendum)
Patient: [name] [name] / Admit Date: 04/03/2013 / Date of Encounter: 04/08/2013, 7:14 AM   Subjective  No CP or SOB, but generally depressed about her whole medical situation.   Objective   Telemetry: NSR Physical Exam: Filed Vitals:   04/08/13 0455  BP: 90/46  Pulse: 72  Temp: 98.5 F (36.9 C)  Resp: 18   General: Well developed, well nourished WF in no acute distress. Head: Normocephalic, atraumatic, sclera non-icteric, no xanthomas, nares are without discharge. Neck: Negative for carotid bruits. JVD not elevated. Lungs: Clear bilaterally to auscultation without wheezes, rales, or rhonchi. Breathing is unlabored. Heart: RRR S1 S2 2/6 SEM without rubs or gallops.  Abdomen: Soft, non-tender, non-distended with normoactive bowel sounds. No hepatomegaly. No rebound/guarding. No obvious abdominal masses. Msk:  Strength and tone appear normal for age. Extremities: No clubbing or cyanosis. No edema.  Distal pedal pulses are 2+ and equal bilaterally. Neuro: Alert and oriented X 3. Moves all extremities spontaneously. Psych:  Responds to questions appropriately with a normal affect.    Intake/Output Summary (Last 24 hours) at 04/08/13 0714 Last data filed at 04/08/13 0640  Gross per 24 hour  Intake   1280 ml  Output   1400 ml  Net   -120 ml    Inpatient Medications:  . ALPRAZolam  0.25 mg Oral QHS  . aspirin EC  81 mg Oral Daily  . calcium-vitamin D  1 tablet Oral BID  . cholecalciferol  1,000 Units Oral Daily  . ferrous sulfate  325 mg Oral Q breakfast   And  . docusate sodium  100 mg Oral Daily  . ezetimibe  10 mg Oral Daily  . fenofibrate  160 mg Oral Daily  . fluticasone  2 spray Each Nare Daily  . heparin  5,000 Units Subcutaneous Q8H  . insulin pump   Subcutaneous TID AC, HS, 0200  . isosorbide mononitrate  15 mg Oral Daily  . metoprolol  50 mg Oral QHS  . metoprolol tartrate  25 mg Oral Daily  . pantoprazole  40 mg Oral Q0600  . polyethylene glycol  17 g Oral  Daily  . potassium chloride SA  40 mEq Oral Daily  . pravastatin  10 mg Oral Daily    Labs:  Recent Labs  04/06/13 0511 04/07/13 0705  NA 141 142  K 3.6 3.9  CL 103 104  CO2 25 28  GLUCOSE 56* 119*  BUN 36* 37*  CREATININE 1.40* 1.33*  CALCIUM 10.0 9.6    Recent Labs  04/06/13 0511 04/07/13 0705  WBC 8.4 8.9  HGB 9.3* 8.9*  HCT 30.3* 28.9*  MCV 79.9 80.5  PLT 252 235   Radiology/Studies:  Cath this adm - see note yesterday   Assessment and Plan  1. CAD with class 3 angina and high risk stress test (prior h/o s/p SVG-RCA in 12/05, then redo SVG-RCA in 1/11 & bioprosthetic AVR, history of restenosis with BMS in the past) - Continue ASA, metoprolol, Imdur, pravastatin, Zetia. Repeat echo pending.  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  Will plan unprotected distal LM/ostial LAD intervention tomorrow.  2. Bioprosthetic AVR: stable on last echo 11/537. 3. Diastolic CHF: await BMET before making decision on resumption of Lasix. Echo pending for assessment of pericardial constriction. 4. CKD stage III with history of renal cell carcinoma s/p R nephrectomy: await BMET this AM (have asked nurse to check with lab about why this has not yet resulted). 5. Acute on chronic anemia: heme positive  by GI eval this admission, source unclear. S/p 1 unit PRBC with goal Hgb >8. Has prior h/o ischemic colitis, duodenal ulcer, mild sigmoid diverticulosis, and colon poylps. EGD essentially normal 04/06/13, celiac labs negative. Dr. Aundra Dubin planned to discuss capsule endoscopy with GI (vs heme eval). Cont iron, PPI. 6. Hyperlipidemia: Continue pravastatin/Zetia. [name] [name] has not tolerated more potent statin types or dosing.  7. Diabetes mellitus: appreciate endo assistance. 8. Hodgkin's disease: s/p prior radiation and chemotherapy. 9. Bradycardia s/p pacemaker: stable.  10. Situational anxiety: will make Xanax available PRN.  Signed, Melina Copa PA-C  Patient seen with PA, agree with the above  note.  Difficult situation.  I think that the best course will be as outlined above: unprotected distal LM/ostial LAD PCI.  [name] [name] is profoundly symptomatic with ST elevation on stress test at low level exercise.  Will plan for tomorrow as long as creatinine remains stable.   Elevated right and left heart filling pressures on RHC.  Awaiting repeat echo, will look for any signs of pericardial constriction.  If creatinine stable today, will diurese today with IV Lasix.   Slow GI bleed, ? AVMs.  Have discussed with patient, will need to follow closely with Plavix use.   [name] 04/08/2013 7:50 AM

## 2013-04-08 NOTE — Progress Notes (Signed)
Pt has been taking care of her Insulin pump and recording readings but uses hospitals CBG to correlate- performed by Nurse tech. To have heart cath with stents 9/18. Aware of plan put forth by DR Kumar/Pharmacy , NPO after MN,  to start IV D5.45 at 50 cc/hr at 0400 and stop Insulin pump at 0800 9/18 and hang Apidra insulin drip at 10/hr.

## 2013-04-08 NOTE — Progress Notes (Signed)
  Echocardiogram 2D Echocardiogram has been performed.  [name] [name] 04/08/2013, 2:46 PM

## 2013-04-08 NOTE — Progress Notes (Addendum)
Visited pt this am regarding her insulin pump setting and glucose levels.  Pt states she still had her pump correction factor set at 15 rather than 50; states she was unclear about this change.  Pt also bolused for her breakfast at 1:10 grams.  Her sensor is showing that her glucose is trending up, and she is very anxious about her glucose going high.  When questioned about giving 41 units bolus 2 evenings ago, she states that she was unclear whether her setting had been changed at that point.  Thus, she was having hypoglycemia most ofl yesterday. Pt is now at 332 mg/dL and is amenable to using an IV insulin drip throughout the rest of this day.  Concern is how much fluid pt can tolerate with her seconday line (10-30 ml/hr) at low rate if possible using D10 NS.  Have contacted Dr Ronnie Derby office and requested he call back and paged Dr Marigene Ehlers. Dr. Marigene Ehlers just answered page and agreed on ordering IV insulin per Glucostabilizer and using D10 NS at low rate.  However, he asked if I could coordinate/verify with Dr. Dwyane Dee as well. Thank you, Rosita Kea, RN, CNS, Diabetes Coordinator [phone])  Just spoke with Dr. Dwyane Dee regarding using an IV insulin drip to regulate patient today and tonight through stent placement tomorrow.  Dr. Dwyane Dee stated that he did not want to do that at this time. When told her latest glucose was 332 mg/dL at 10 am today, he said she could use correction. He stated he would be seeing her in about an hour and would review status. Thank you, Rosita Kea, RN, CNS, Diabetes Coordinator [phone])

## 2013-04-08 NOTE — Progress Notes (Signed)
Patient currently watching heart cath education video.  Encouraged to ask questions. RN will continue to monitor. Shellee Milo, RN

## 2013-04-09 ENCOUNTER — Encounter (HOSPITAL_COMMUNITY)
Admission: EM | Disposition: A | Discharge status: Home / Self Care | Payer: BC Managed Care – PPO | Source: Home / Self Care | Attending: Cardiology

## 2013-04-09 ENCOUNTER — Other Ambulatory Visit: Payer: BC Managed Care – PPO

## 2013-04-09 DIAGNOSIS — I2510 Atherosclerotic heart disease of native coronary artery without angina pectoris: Secondary | ICD-10-CM

## 2013-04-09 DIAGNOSIS — I5030 Unspecified diastolic (congestive) heart failure: Secondary | ICD-10-CM

## 2013-04-09 HISTORY — PX: PERCUTANEOUS CORONARY STENT INTERVENTION (PCI-S): SHX5485

## 2013-04-09 LAB — PROTIME-INR: INR: 1.11 (ref 0.00–1.49)

## 2013-04-09 LAB — GLUCOSE, CAPILLARY
Glucose-Capillary: 196 mg/dL — ABNORMAL HIGH (ref 70–99)
Glucose-Capillary: 127 mg/dL — ABNORMAL HIGH (ref 70–99)
Glucose-Capillary: 110 mg/dL — ABNORMAL HIGH (ref 70–99)
Glucose-Capillary: 80 mg/dL (ref 70–99)
Glucose-Capillary: 85 mg/dL (ref 70–99)
Glucose-Capillary: 87 mg/dL (ref 70–99)
Glucose-Capillary: 168 mg/dL — ABNORMAL HIGH (ref 70–99)
Glucose-Capillary: 189 mg/dL — ABNORMAL HIGH (ref 70–99)

## 2013-04-09 LAB — CBC
MCV: 80.3 fL (ref 78.0–100.0)
Platelets: 198 10*3/uL (ref 150–400)
RBC: 3.65 MIL/uL — ABNORMAL LOW (ref 3.87–5.11)
WBC: 7.6 10*3/uL (ref 4.0–10.5)

## 2013-04-09 LAB — BASIC METABOLIC PANEL
CO2: 26 mEq/L (ref 19–32)
Chloride: 101 mEq/L (ref 96–112)
Sodium: 138 mEq/L (ref 135–145)

## 2013-04-09 SURGERY — PERCUTANEOUS CORONARY STENT INTERVENTION (PCI-S)
Anesthesia: LOCAL

## 2013-04-09 MED ORDER — BIVALIRUDIN 250 MG IV SOLR
INTRAVENOUS | Status: AC
  Filled 2013-04-09: qty 250

## 2013-04-09 MED ORDER — MIDAZOLAM HCL 2 MG/2ML IJ SOLN
INTRAMUSCULAR | Status: AC
  Filled 2013-04-09: qty 2

## 2013-04-09 MED ORDER — LIDOCAINE HCL (PF) 1 % IJ SOLN
INTRAMUSCULAR | Status: AC
  Filled 2013-04-09: qty 30

## 2013-04-09 MED ORDER — ONDANSETRON HCL 4 MG/2ML IJ SOLN: 4.0000 mg | Freq: Four times a day (QID) | INTRAMUSCULAR | Status: DC | PRN

## 2013-04-09 MED ORDER — DEXTROSE 50 % IV SOLN
INTRAVENOUS | Status: AC
  Filled 2013-04-09: qty 50

## 2013-04-09 MED ORDER — SODIUM CHLORIDE 0.9 % IV SOLN: INTRAVENOUS | Status: AC

## 2013-04-09 MED ORDER — FENTANYL CITRATE 0.05 MG/ML IJ SOLN
INTRAMUSCULAR | Status: AC
  Filled 2013-04-09: qty 2

## 2013-04-09 MED ORDER — NITROGLYCERIN 0.2 MG/ML ON CALL CATH LAB
INTRAVENOUS | Status: AC
  Filled 2013-04-09: qty 1

## 2013-04-09 MED ORDER — HEPARIN (PORCINE) IN NACL 2-0.9 UNIT/ML-% IJ SOLN
INTRAMUSCULAR | Status: AC
  Filled 2013-04-09: qty 1000

## 2013-04-09 MED ORDER — ACETAMINOPHEN 325 MG PO TABS: 650.0000 mg | ORAL_TABLET | ORAL | Status: DC | PRN

## 2013-04-09 NOTE — H&P (View-Only) (Signed)
Patient ID: [name] [name], female   DOB: [birth year], 62 y.o.   MRN: [id number]  Patient: [name] [name] / Admit Date: 04/03/2013 / Date of Encounter: 04/09/2013, 7:31 AM   Subjective  No chest pain or dyspnea.  Diuresed well yesterday, abdomen less distended.  Echo with preserved EF and normally functioning bioprosthetic aortic valve.    Objective   Telemetry: NSR Physical Exam: Filed Vitals:   04/09/13 0420  BP: 98/40  Pulse: 88  Temp: 98.1 F (36.7 C)  Resp: 18   General: Well developed, well nourished WF in no acute distress. Head: Normocephalic, atraumatic, sclera non-icteric, no xanthomas, nares are without discharge. Neck: Negative for carotid bruits. JVD not elevated. Lungs: Clear bilaterally to auscultation without wheezes, rales, or rhonchi. Breathing is unlabored. Heart: RRR S1 S2 2/6 SEM without rubs or gallops.  Abdomen: Soft, non-tender, mild distention with normoactive bowel sounds. No hepatomegaly. No rebound/guarding. No obvious abdominal masses. Msk:  Strength and tone appear normal for age. Extremities: No clubbing or cyanosis. No edema.  Distal pedal pulses are 2+ and equal bilaterally. Neuro: Alert and oriented X 3. Moves all extremities spontaneously. Psych:  Responds to questions appropriately with a normal affect.    Intake/Output Summary (Last 24 hours) at 04/09/13 0731 Last data filed at 04/09/13 0700  Gross per 24 hour  Intake    720 ml  Output   4275 ml  Net  -3555 ml    Inpatient Medications:  . aspirin  324 mg Oral Pre-Cath  . [START ON 04/10/2013] aspirin EC  81 mg Oral Daily  . calcium-vitamin D  1 tablet Oral BID  . cholecalciferol  1,000 Units Oral Daily  . clopidogrel  75 mg Oral Q breakfast  . ferrous sulfate  325 mg Oral Q breakfast   And  . docusate sodium  100 mg Oral Daily  . ezetimibe  10 mg Oral Daily  . fenofibrate  160 mg Oral Daily  . fluticasone  2 spray Each Nare Daily  . heparin  5,000 Units Subcutaneous Q8H  .  insulin pump   Subcutaneous TID AC, HS, 0200  . isosorbide mononitrate  15 mg Oral Daily  . metoprolol  50 mg Oral QHS  . metoprolol tartrate  25 mg Oral Daily  . pantoprazole  40 mg Oral Q0600  . polyethylene glycol  17 g Oral Daily  . potassium chloride SA  40 mEq Oral Daily  . pravastatin  10 mg Oral Daily    Labs:  Recent Labs  04/07/13 0705 04/08/13 0540  NA 142 139  K 3.9 4.1  CL 104 105  CO2 28 22  GLUCOSE 119* 67*  BUN 37* 27*  CREATININE 1.33* 1.20*  CALCIUM 9.6 9.4    Recent Labs  04/08/13 0540 04/09/13 0645  WBC 8.8 7.6  HGB 8.6* 8.9*  HCT 27.2* 29.3*  MCV 80.2 80.3  PLT 219 198   Radiology/Studies:  Cath this adm - see note yesterday   Assessment and Plan  1. CAD with class 3 angina and high risk stress test (prior h/o s/p SVG-RCA in 12/05, then redo SVG-RCA in 1/11 & bioprosthetic AVR, history of restenosis with BMS in the past) - Continue ASA, metoprolol, Imdur, pravastatin, Zetia. EF preserved on echo. 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  Plan unprotected distal LM/ostial LAD intervention today (Dr. Angelena Form). Plavix started yesterday.  2. Bioprosthetic AVR: stable on echo yesterday. 3. Diastolic CHF: Lasix IV yesterday with good diuresis (significantly elevated filling  pressures at right heart cath Tuesday).  Lasix held this morning for PCI.  Will resume tomorrow if renal function stable. 4. CKD stage III with history of renal cell carcinoma s/p R nephrectomy: await BMET this AM (creatinine improved yesterday morning).   5. Acute on chronic anemia: heme positive by GI eval this admission, source unclear. S/p 1 unit PRBC with goal Hgb >8. Has prior h/o ischemic colitis, duodenal ulcer, mild sigmoid diverticulosis, and colon poylps. EGD essentially normal 04/06/13, celiac labs negative. GI does not think capsule endoscopy would be useful or change management.  She is on iron now and hemoglobin is stable.  After discharge, I will refer to hematology for  erythropoietin. 6. Hyperlipidemia: Continue pravastatin/Zetia. She has not tolerated more potent statin types or dosing.  7. Diabetes mellitus: appreciate endo assistance.  Doing better with insulin gtt rather than using her pump at this time.  Will need to resume pump for discharge.  8. Hodgkin's disease: s/p prior radiation to chest and chemotherapy. 9. Bradycardia s/p pacemaker: stable.  10. Situational anxiety: will make Xanax available PRN.  [name] 04/09/2013 7:31 AM

## 2013-04-09 NOTE — Progress Notes (Signed)
Patient ID: [name] [name], female   DOB: [birth year], 62 y.o.   MRN: [id number]  Subjective:  Followup of diabetes/insulin pump management  The patient has had fairly significant swings in her blood sugars over the last couple of days Yesterday because of blood sugars getting significantly high after breakfast her pump settings were changed in the afternoon. Previously she had been on a basal rate of 0.5 but appeared to need less insulin overnight Because of persistently high readings over 200 her basal rate was increased to 1.5 until midnight However even with this her blood sugars continue to be in the 200+ range At bedtime she was told to change her sensitivity to 1:25 and she did get a bolus at about 9 PM. However her blood sugar came down only later at night at about 4 AM Subsequently because of her blood sugar coming down relatively quickly her insulin pump was taken off at about 6 AM and since then she has been off any insulin infusion and also getting a dextrose IV  She feels fairly good Prior to admission she was on a 1:2 carbohydrate to insulin ratio and basal rates were as high as 4.5 during the day  CBG (last 3)   Recent Labs  04/09/13 0415 04/09/13 0621 04/09/13 0712  GLUCAP 196* 127* 110*     Objective: Vital signs in last 24 hours: Temp:  [98 F (36.7 C)-98.1 F (36.7 C)] 98.1 F (36.7 C) (09/18 0420) Pulse Rate:  [76-103] 88 (09/18 0420) Resp:  [18] 18 (09/18 0420) BP: (98-140)/(40-61) 103/45 mmHg (09/18 1234) SpO2:  [96 %-99 %] 99 % (09/18 0420) Weight:  [123 lb (55.792 kg)] 123 lb (55.792 kg) (09/18 0420) Weight change: -1 lb 9 oz (-0.708 kg) Last BM Date: 04/07/13  Intake/Output from previous day: 09/17 0701 - 09/18 0700 In: 720 [P.O.:720] Out: 4275 [Urine:4275] Intake/Output this shift:    General appearance: mild distress  Lab Results:  Recent Labs  04/08/13 0540 04/09/13 0645  WBC 8.8 7.6  HGB 8.6* 8.9*  HCT 27.2* 29.3*  PLT 219 198    BMET  Recent Labs  04/08/13 0540 04/09/13 0645  NA 139 138  K 4.1 3.6  CL 105 101  CO2 22 26  GLUCOSE 67* 101*  BUN 27* 29*  CREATININE 1.20* 1.33*  CALCIUM 9.4 9.8    Studies/Results: No results found.  Medications:  . [START ON 04/10/2013] aspirin EC  81 mg Oral Daily  . calcium-vitamin D  1 tablet Oral BID  . cholecalciferol  1,000 Units Oral Daily  . clopidogrel  75 mg Oral Q breakfast  . ferrous sulfate  325 mg Oral Q breakfast   And  . docusate sodium  100 mg Oral Daily  . ezetimibe  10 mg Oral Daily  . fenofibrate  160 mg Oral Daily  . fluticasone  2 spray Each Nare Daily  . heparin  5,000 Units Subcutaneous Q8H  . isosorbide mononitrate  15 mg Oral Daily  . metoprolol  50 mg Oral QHS  . metoprolol tartrate  25 mg Oral Daily  . pantoprazole  40 mg Oral Q0600  . polyethylene glycol  17 g Oral Daily  . potassium chloride SA  40 mEq Oral Daily  . pravastatin  10 mg Oral Daily    Assessment/Plan:  She is overall more sensitive to the insulin  compared to previous requirement of nearly 200 units a day with insulin pump  Currently appears to be requiring less insulin overnight  but the patterns are somewhat different last night with blood sugar coming down more slowly and more so after 4 AM Blood sugar is generally increasing progressively during the day especially after breakfast but today has been fairly level without any insulin since early this morning She will continue to be monitored without any insulin until the blood sugar is over 200  NEW basal rate settings will be 0.5 between 9 PM and 7 AM and 2.0 between 7 AM and 9 PM She will continue 1:10 carbohydrate coverage and 1: 25 correction factor until tomorrow and this will be adjusted as needed She probably does not need any insulin to cover her cardiac procedure today   LOS: 6 days   Lorea Kupfer 04/09/2013, 1:14 PM

## 2013-04-09 NOTE — Progress Notes (Signed)
Pt CBG 91 notified Dr. Aundra Dubin, going to hold apidra for now.

## 2013-04-09 NOTE — CV Procedure (Signed)
Cardiac Catheterization Operative Report  [name] [name] [id number] 9/18/20143:35 PM Leonides Grills, MD  Procedure Performed:  1. PTCA/DES x 1 left main into LAD   2. IVUS left main/LAD  Operator: Lauree Chandler, MD  Indication: 62 yo female with CAD s/p CABG admitted with unstable angina. Severe disease in the distal Left main, ostial LAD. Her diagnostic cath was 04/07/13 per Dr. Aundra Dubin. 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 She has had two previous open chest procedures with SVG to RCA x 2. Her left coronary system has not been bypassed. The patients only option for revascularization is PCI of the left main into the LAD. She and her family understands that this is a high risk PCI. I have reviewed the films in detail with my interventional colleagues. I will plan to place a DES in the ostium of the left main extending down into the LAD. I am hopeful that her Circumflex will remain patent as it will be crossed by the stent. The patient understands the risk of potentially losing flow into the Circumflex. I have drawn detailed pictures outlining her anatomy, stenoses and my plans for stent placement during the procedure. She agrees to proceed.                                       Procedure Details: The risks, benefits, complications, treatment options, and expected outcomes were discussed with the patient. The patient and/or family concurred with the proposed plan, giving informed consent. The patient was brought to the cath lab after IV hydration was begun and oral premedication was given. The patient was further sedated with Versed and Fentanyl. The right groin was prepped and draped in the usual manner. Using the modified Seldinger access technique, a 6 French sheath was placed in the right femoral artery. She was given a weight based bolus of Angiomax and a drip was started. She had been given 300 mg Plavix po x 1 yesterday. I  engaged the left main with a XB LAD 3.5 guiding catheter with sideholes. When the ACT was greater than 200, I passed a BMW wire down the LAD. A 2.0 x 15 mm balloon was used to pre-dilate the distal left main into the LAD x 1. I then carefully positioned and deployed a 2.75 x 24 mm Promus Premier DES in the ostium of the left main extending down into the proximal LAD. I then passed an IVUS catheter into the LAD and performed automated pullback visualizing the stented segment. This was used to size the post-dilatation balloon. A 3.0 x 15 mm [place] balloon was inflated to 12 atm in the distal stented segment and 18 atm in the proximal stented segment. There was an excellent angiographic result. The stenosis was taken from 90% down to 0%. There was excellent flow down the Circumflex artery.   There were no immediate complications. The patient was taken to the recovery area in stable condition.   Hemodynamic Findings: Central aortic pressure: 117/59  Impression: 1. Unstable angina 2. Successful PTCA/DES x 1 left main coronary artery into the LAD  Recommendations: She will need lifelong dual antiplatelet therapy with ASA and Plavix. Continue aggressive risk factor modification. Continue other cardiac meds.        Complications:  None; patient tolerated the procedure well.

## 2013-04-09 NOTE — Telephone Encounter (Signed)
ok 

## 2013-04-09 NOTE — Progress Notes (Signed)
Site area: right groin  Site Prior to Removal:  Level 1  Pressure Applied For 20 MINUTES    Minutes Beginning at 1810  Manual:   yes  Patient Status During Pull:  AAO X4  Post Pull Groin Site:  Level 1  Post Pull Instructions Given:  yes  Post Pull Pulses Present:  yes  Dressing Applied:  yes  Comments:  Tolerated procedure well

## 2013-04-09 NOTE — Progress Notes (Signed)
Inpatient Diabetes Program Recommendations  AACE/ADA: New Consensus Statement on Inpatient Glycemic Control (2013)  Target Ranges:  Prepandial:   less than 140 mg/dL      Peak postprandial:   less than 180 mg/dL (1-2 hours)      Critically ill patients:  140 - 180 mg/dL   Reason for Visit: Patient is NPO for Cath/stent today.  She turned off insulin pump around 0730 because her CGM indicated that CBG's dropping. Last CBG was 92 mg/dL.  She currently has D51/2 NS infusing at 50 cc/hr.  Currently insulin drip is not running.  RN to continue to check CBG's hourly.  May need to call Dr. Dwyane Dee regarding starting insulin drip.  At this time it appears that patient may not need it.  Discussed with RN.  Will follow closely.  Adah Perl, RN, BC-ADM Inpatient Diabetes Coordinator Pager [phone]

## 2013-04-09 NOTE — Interval H&P Note (Signed)
History and Physical Interval Note:  04/09/2013 2:23 PM  [name] [name]  has presented today for PCI of the left main/LAD with the diagnosis of unstable angina. She has been turned down for CABG.  The various methods of treatment have been discussed with the patient and family. After consideration of risks, benefits and other options for treatment, the patient has consented to  Procedure(s): PERCUTANEOUS CORONARY STENT INTERVENTION (PCI-S) (N/A) as a surgical intervention .  The patient's history has been reviewed, patient examined, no change in status, stable for surgery.  I have reviewed the patient's chart and labs.  Questions were answered to the patient's satisfaction.    Cath Lab Visit (complete for each Cath Lab visit)  Clinical Evaluation Leading to the Procedure:   ACS: no  Non-ACS:    Anginal Classification: CCS III  Anti-ischemic medical therapy: Maximal Therapy (2 or more classes of medications)  Non-Invasive Test Results: No non-invasive testing performed  Prior CABG: Previous CABG        MCALHANY,CHRISTOPHER

## 2013-04-09 NOTE — Progress Notes (Signed)
Pt restarted insulin pump d/t second CBG >150.

## 2013-04-09 NOTE — Progress Notes (Signed)
Pt CBG at 12 am 289, 4am 196, 6am 126, 7:15 am 110, pt stopped her insulin pump due to the drop in blood sugar overnight, attempted to call on call for MD Dwyane Dee not able, but able to contact the diabetes coordinator to let them know about the drop in blood sugar, day RN will try to call MD Dwyane Dee again, will continue to monitor, Thanks, Arvella Nigh RN

## 2013-04-09 NOTE — Progress Notes (Signed)
Patient ID: [name] [name], female   DOB: [birth year], 62 y.o.   MRN: [id number]  Patient: [name] [name] / Admit Date: 04/03/2013 / Date of Encounter: 04/09/2013, 7:31 AM   Subjective  No chest pain or dyspnea.  Diuresed well yesterday, abdomen less distended.  Echo with preserved EF and normally functioning bioprosthetic aortic valve.    Objective   Telemetry: NSR Physical Exam: Filed Vitals:   04/09/13 0420  BP: 98/40  Pulse: 88  Temp: 98.1 F (36.7 C)  Resp: 18   General: Well developed, well nourished WF in no acute distress. Head: Normocephalic, atraumatic, sclera non-icteric, no xanthomas, nares are without discharge. Neck: Negative for carotid bruits. JVD not elevated. Lungs: Clear bilaterally to auscultation without wheezes, rales, or rhonchi. Breathing is unlabored. Heart: RRR S1 S2 2/6 SEM without rubs or gallops.  Abdomen: Soft, non-tender, mild distention with normoactive bowel sounds. No hepatomegaly. No rebound/guarding. No obvious abdominal masses. Msk:  Strength and tone appear normal for age. Extremities: No clubbing or cyanosis. No edema.  Distal pedal pulses are 2+ and equal bilaterally. Neuro: Alert and oriented X 3. Moves all extremities spontaneously. Psych:  Responds to questions appropriately with a normal affect.    Intake/Output Summary (Last 24 hours) at 04/09/13 0731 Last data filed at 04/09/13 0700  Gross per 24 hour  Intake    720 ml  Output   4275 ml  Net  -3555 ml    Inpatient Medications:  . aspirin  324 mg Oral Pre-Cath  . [START ON 04/10/2013] aspirin EC  81 mg Oral Daily  . calcium-vitamin D  1 tablet Oral BID  . cholecalciferol  1,000 Units Oral Daily  . clopidogrel  75 mg Oral Q breakfast  . ferrous sulfate  325 mg Oral Q breakfast   And  . docusate sodium  100 mg Oral Daily  . ezetimibe  10 mg Oral Daily  . fenofibrate  160 mg Oral Daily  . fluticasone  2 spray Each Nare Daily  . heparin  5,000 Units Subcutaneous Q8H  .  insulin pump   Subcutaneous TID AC, HS, 0200  . isosorbide mononitrate  15 mg Oral Daily  . metoprolol  50 mg Oral QHS  . metoprolol tartrate  25 mg Oral Daily  . pantoprazole  40 mg Oral Q0600  . polyethylene glycol  17 g Oral Daily  . potassium chloride SA  40 mEq Oral Daily  . pravastatin  10 mg Oral Daily    Labs:  Recent Labs  04/07/13 0705 04/08/13 0540  NA 142 139  K 3.9 4.1  CL 104 105  CO2 28 22  GLUCOSE 119* 67*  BUN 37* 27*  CREATININE 1.33* 1.20*  CALCIUM 9.6 9.4    Recent Labs  04/08/13 0540 04/09/13 0645  WBC 8.8 7.6  HGB 8.6* 8.9*  HCT 27.2* 29.3*  MCV 80.2 80.3  PLT 219 198   Radiology/Studies:  Cath this adm - see note yesterday   Assessment and Plan  1. CAD with class 3 angina and high risk stress test (prior h/o s/p SVG-RCA in 12/05, then redo SVG-RCA in 1/11 & bioprosthetic AVR, history of restenosis with BMS in the past) - Continue ASA, metoprolol, Imdur, pravastatin, Zetia. EF preserved on echo. 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  Plan unprotected distal LM/ostial LAD intervention today (Dr. Angelena Form). Plavix started yesterday.  2. Bioprosthetic AVR: stable on echo yesterday. 3. Diastolic CHF: Lasix IV yesterday with good diuresis (significantly elevated filling  pressures at right heart cath Tuesday).  Lasix held this morning for PCI.  Will resume tomorrow if renal function stable. 4. CKD stage III with history of renal cell carcinoma s/p R nephrectomy: await BMET this AM (creatinine improved yesterday morning).   5. Acute on chronic anemia: heme positive by GI eval this admission, source unclear. S/p 1 unit PRBC with goal Hgb >8. Has prior h/o ischemic colitis, duodenal ulcer, mild sigmoid diverticulosis, and colon poylps. EGD essentially normal 04/06/13, celiac labs negative. GI does not think capsule endoscopy would be useful or change management.  She is on iron now and hemoglobin is stable.  After discharge, I will refer to hematology for  erythropoietin. 6. Hyperlipidemia: Continue pravastatin/Zetia. She has not tolerated more potent statin types or dosing.  7. Diabetes mellitus: appreciate endo assistance.  Doing better with insulin gtt rather than using her pump at this time.  Will need to resume pump for discharge.  8. Hodgkin's disease: s/p prior radiation to chest and chemotherapy. 9. Bradycardia s/p pacemaker: stable.  10. Situational anxiety: will make Xanax available PRN.  [name] 04/09/2013 7:31 AM

## 2013-04-09 NOTE — Progress Notes (Signed)
1700  Call received from RN.  Patient has returned from cath lab.  She has not required IV insulin all day and she disconnected insulin pump this morning.  Per Dr. Ronnie Derby note earlier, she may resume her insulin pump after cath.  CBG was down to 76 mg/dL at 4:15 and patient received D50.  Recommended continuing to check CBG's hourly and once CBG's greater than 150 mg/dL x 2, insulin pump can likely be restarted. RN states that MD is on the unit and she will discuss with him and obtain orders.  Discussed plan with patient also. Adah Perl, RN, BC-ADM Inpatient Diabetes Coordinator Pager [phone]

## 2013-04-09 NOTE — Progress Notes (Signed)
Pt admitted with unstable angina. Severe disease in the distal Left main, ostial LAD. Her diagnostic cath was 04/07/13 per Dr. Aundra Dubin. 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 She has had two previous open chest procedures with SVG to RCA x 2. Her left coronary system has not been bypassed.   The patients only option for revascularization is PCI of the left main into the LAD. She and her family understands that this is a high risk PCI. I have reviewed the films in detail with my interventional colleagues. I will plan to place a DES in the ostium of the left main extending down into the LAD. I am hopeful that her Circumflex will remain patent as it will be crossed by the stent. The patient understands the risk of potentially losing flow into the Circumflex. I have drawn detailed pictures outlining her anatomy, stenoses and my plans for stent placement during the procedure. She agrees to proceed.   MCALHANY,CHRISTOPHER 04/09/2013 2:40 PM

## 2013-04-10 DIAGNOSIS — Z9861 Coronary angioplasty status: Secondary | ICD-10-CM

## 2013-04-10 LAB — CBC
MCH: 25 pg — ABNORMAL LOW (ref 26.0–34.0)
MCV: 81.6 fL (ref 78.0–100.0)
Platelets: 170 10*3/uL (ref 150–400)
RBC: 3.32 MIL/uL — ABNORMAL LOW (ref 3.87–5.11)
RDW: 18.7 % — ABNORMAL HIGH (ref 11.5–15.5)

## 2013-04-10 LAB — GLUCOSE, CAPILLARY
Glucose-Capillary: 143 mg/dL — ABNORMAL HIGH (ref 70–99)
Glucose-Capillary: 210 mg/dL — ABNORMAL HIGH (ref 70–99)
Glucose-Capillary: 191 mg/dL — ABNORMAL HIGH (ref 70–99)

## 2013-04-10 LAB — BASIC METABOLIC PANEL
CO2: 26 mEq/L (ref 19–32)
Sodium: 138 mEq/L (ref 135–145)

## 2013-04-10 MED ORDER — INSULIN PUMP
Freq: Three times a day (TID) | SUBCUTANEOUS | Status: DC
  Administered 2013-04-10: 9.45 via SUBCUTANEOUS
  Administered 2013-04-10: 1 via SUBCUTANEOUS
  Administered 2013-04-11: 10:00:00 8.5 via SUBCUTANEOUS
  Filled 2013-04-10: qty 1

## 2013-04-10 MED ORDER — INSULIN PUMP
SUBCUTANEOUS | Status: AC
  Administered 2013-04-10: 1 via SUBCUTANEOUS
  Filled 2013-04-10: qty 1

## 2013-04-10 MED ORDER — TICAGRELOR 90 MG PO TABS
90.0000 mg | ORAL_TABLET | Freq: Two times a day (BID) | ORAL | Status: DC
  Administered 2013-04-11: 90 mg via ORAL
  Filled 2013-04-10 (×2): qty 1

## 2013-04-10 MED ORDER — TICAGRELOR 90 MG PO TABS
180.0000 mg | ORAL_TABLET | Freq: Once | ORAL | Status: AC
  Administered 2013-04-10: 180 mg via ORAL
  Filled 2013-04-10: qty 2

## 2013-04-10 MED FILL — Sodium Chloride IV Soln 0.9%: INTRAVENOUS | Qty: 50 | Status: AC

## 2013-04-10 NOTE — Progress Notes (Signed)
Patient ID: [name] [name], female   DOB: 04-16-1951, 62 y.o.   MRN: [id number]    SUBJECTIVE: [name] [name] received a DES to the left main coronary artery extending into the LAD yesterday. She is feeling well this morning. She had some lower throat "sensations" earlier, but denies chest pain per se and also denies shortness of breath and leg swelling. She said her hands and feet feel very mildly tight. She was ambulated with cardiac rehab assistance earlier and had two episodes of SOB. She would like to stay another day in the hospital if possible.      Intake/Output Summary (Last 24 hours) at 04/10/13 1023 Last data filed at 04/10/13 0900  Gross per 24 hour  Intake    825 ml  Output    700 ml  Net    125 ml    Current Facility-Administered Medications  Medication Dose Route Frequency Provider Last Rate Last Dose  . acetaminophen (TYLENOL) tablet 650 mg  650 mg Oral Q4H PRN Burnell Blanks, MD      . ALPRAZolam Duanne Moron) tablet 0.25 mg  0.25 mg Oral TID PRN Charlie Pitter, PA-C   0.25 mg at 04/09/13 1720  . aspirin EC tablet 81 mg  81 mg Oral Daily Larey Dresser, MD   81 mg at 04/10/13 9563  . calcium-vitamin D (OSCAL WITH D) 500-200 MG-UNIT per tablet 1 tablet  1 tablet Oral BID Josue Hector, MD   1 tablet at 04/10/13 0930  . cholecalciferol (VITAMIN D) tablet 1,000 Units  1,000 Units Oral Daily Roger A Arguello, PA-C   1,000 Units at 04/10/13 0930  . clopidogrel (PLAVIX) tablet 75 mg  75 mg Oral Q breakfast Larey Dresser, MD   75 mg at 04/10/13 0930  . dextrose 50 % solution 25 mL  25 mL Intravenous PRN Elayne Snare, MD      . ferrous sulfate tablet 325 mg  325 mg Oral Q breakfast Josue Hector, MD   325 mg at 04/10/13 8756   And  . docusate sodium (COLACE) capsule 100 mg  100 mg Oral Daily Josue Hector, MD   100 mg at 04/10/13 0929  . ezetimibe (ZETIA) tablet 10 mg  10 mg Oral Daily Roger A Arguello, PA-C   10 mg at 04/09/13 2245  . fenofibrate tablet 160 mg  160 mg  Oral Daily Roger A Arguello, PA-C   160 mg at 04/09/13 2245  . fluticasone (FLONASE) 50 MCG/ACT nasal spray 2 spray  2 spray Each Nare Daily Roger A Arguello, PA-C   2 spray at 04/08/13 1458  . insulin pump   Subcutaneous TID AC, HS, 0200 Larey Dresser, MD      . isosorbide mononitrate (IMDUR) 24 hr tablet 15 mg  15 mg Oral Daily Roger A Arguello, PA-C   15 mg at 04/08/13 2152  . metoprolol (LOPRESSOR) tablet 50 mg  50 mg Oral QHS Josue Hector, MD   50 mg at 04/09/13 2248  . metoprolol tartrate (LOPRESSOR) tablet 25 mg  25 mg Oral Daily Josue Hector, MD   25 mg at 04/10/13 0929  . nitroGLYCERIN (NITROSTAT) SL tablet 0.4 mg  0.4 mg Sublingual Q5 Min x 3 PRN Roger A Arguello, PA-C      . ondansetron (ZOFRAN) injection 4 mg  4 mg Intravenous Q6H PRN Burnell Blanks, MD      . pantoprazole (PROTONIX) EC tablet 40 mg  40 mg Oral  [id number] Amy S Esterwood, PA-C   40 mg at 04/09/13 2245  . polyethylene glycol (MIRALAX / GLYCOLAX) packet 17 g  17 g Oral Daily Josue Hector, MD   17 g at 04/10/13 0930  . potassium chloride SA (K-DUR,KLOR-CON) CR tablet 40 mEq  40 mEq Oral Daily Roger A Arguello, PA-C   40 mEq at 04/10/13 0929  . pravastatin (PRAVACHOL) tablet 10 mg  10 mg Oral Daily Josue Hector, MD   10 mg at 04/09/13 2245  . triamcinolone (KENALOG) 7.564 % cream 1 application  1 application Topical PRN Roger A Arguello, PA-C        Filed Vitals:   04/10/13 0032 04/10/13 0400 04/10/13 0500 04/10/13 0756  BP:  134/66  113/40  Pulse:  82  85  Temp:  97.8 F (36.6 C)  98 F (36.7 C)  TempSrc:  Oral  Oral  Resp:  18  18  Height:      Weight: 119 lb 11.4 oz (54.3 kg)  119 lb 11.4 oz (54.3 kg)   SpO2:  98%  98%    PHYSICAL EXAM General: NAD, in good spirits. Neck: No JVD, no thyromegaly or thyroid nodule.  Lungs: Clear to auscultation bilaterally with normal respiratory effort. CV: Nondisplaced PMI.  Heart regular S1/S2, no S3/S4, II/VI ejection systolic murmur at RUSB.  No peripheral  edema.  No carotid bruit.  Normal pedal pulses.  Abdomen: Soft, nontender, no hepatosplenomegaly, no distention.  Neurologic: Alert and oriented x 3.  Psych: Normal affect. Extremities: No clubbing or cyanosis.    LABS: Basic Metabolic Panel:  Recent Labs  04/09/13 0645 04/10/13 0630  NA 138 138  K 3.6 4.4  CL 101 103  CO2 26 26  GLUCOSE 101* 151*  BUN 29* 26*  CREATININE 1.33* 1.40*  CALCIUM 9.8 9.7   Liver Function Tests: No results found for this basename: AST, ALT, ALKPHOS, BILITOT, PROT, ALBUMIN,  in the last 72 hours No results found for this basename: LIPASE, AMYLASE,  in the last 72 hours CBC:  Recent Labs  04/09/13 0645 04/10/13 0630  WBC 7.6 6.7  HGB 8.9* 8.3*  HCT 29.3* 27.1*  MCV 80.3 81.6  PLT 198 170   Cardiac Enzymes: No results found for this basename: CKTOTAL, CKMB, CKMBINDEX, TROPONINI,  in the last 72 hours BNP: No components found with this basename: POCBNP,  D-Dimer: No results found for this basename: DDIMER,  in the last 72 hours Hemoglobin A1C: No results found for this basename: HGBA1C,  in the last 72 hours Fasting Lipid Panel: No results found for this basename: CHOL, HDL, LDLCALC, TRIG, CHOLHDL, LDLDIRECT,  in the last 72 hours Thyroid Function Tests: No results found for this basename: TSH, T4TOTAL, FREET3, T3FREE, THYROIDAB,  in the last 72 hours Anemia Panel: No results found for this basename: VITAMINB12, FOLATE, FERRITIN, TIBC, IRON, RETICCTPCT,  in the last 72 hours      ASSESSMENT AND PLAN: 1. CAD with class 3 angina and high risk stress test (prior h/o s/p SVG-RCA in 12/05, then redo SVG-RCA in 1/11 & bioprosthetic AVR, history of restenosis with BMS in the past) s/p PTCA/DES of distal LM/ostial LAD.   - Continue ASA, Plavix, metoprolol, Imdur, pravastatin, Zetia. EF preserved on echo.  2. Bioprosthetic AVR: stable on echo. 3. Diastolic CHF: she had significantly elevated filling pressures at right heart cath on Tuesday.  Lasix was held yesterday for PCI. Creatinine up to 1.4 from 1.33, and given that her  symptoms are stable this morning, will elect to hold off on Lasix and follow both symptoms and renal function and reassess tomorrow. 4. CKD stage III with history of renal cell carcinoma s/p R nephrectomy: creatinine up to 1.4 from 1.33. Hold diuretics. 5. Acute on chronic anemia: heme positive by GI eval this admission, source unclear. S/p 1 unit PRBC with goal Hgb >8. Has prior h/o ischemic colitis, duodenal ulcer, mild sigmoid diverticulosis, and colon poylps. EGD essentially normal 04/06/13, celiac labs negative. GI does not think capsule endoscopy would be useful or change management. She is on iron now and hemoglobin is stable (8.3 today). After discharge, Dr. Aundra Dubin plans to refer to hematology for erythropoietin.  6. Hyperlipidemia: Continue pravastatin/Zetia. She has not tolerated more potent statin types or dosing.  7. Diabetes mellitus: appreciate endo assistance. Insulin pump restarted last night. 8. Hodgkin's disease: s/p prior radiation to chest and chemotherapy.  9. Bradycardia s/p pacemaker: stable.  10. Situational anxiety: will continue to make Xanax available PRN.    Kate Sable, M.D., F.A.C.C.

## 2013-04-10 NOTE — Progress Notes (Signed)
CARDIAC REHAB PHASE I   PRE:  Rate/Rhythm: 83 first deg    BP: sitting 113/40    SaO2:   MODE:  Ambulation: 580 ft   POST:  Rate/Rhythm: 97 first deg    BP: sitting 167/53     SaO2:   Pt independent but had intermittent SOB with needed rest x2. No CP. BP elevated after walk. To recliner. Ed completed (no diet as she is seeing dietician at [facility] for gastroparesis). Interested in [place] and will send referral to [place]. Encouraged to slowly increase ex tol. 0750-0900   Josephina Shih [place] CES, ACSM 04/10/2013 8:58 AM

## 2013-04-10 NOTE — Progress Notes (Signed)
P2Y12 returned at 271. D/w Dr. Meda Coffee & Dr. Burt Knack. Will stop Plavix and load with Brilinta 17m tonight then start 976mBID starting tomorrow. Fateh Kindle PA-C

## 2013-04-10 NOTE — Progress Notes (Signed)
Patient ID: [name] [name], female   DOB: 25-Feb-1951, 62 y.o.   MRN: [id number]   Subjective:  Followup of diabetes/insulin pump management  The patient has continued to have variable patterns of blood sugar readings over the last day. Her blood sugars were relatively low throughout the morning and early afternoon yesterday evening without any insulin and did not require IV insulin during her stent procedure Blood sugars started increasing gradually last evening and she went back on her insulin pump with the new settings at about 10 PM last night Although she has had a tendency to low sugars early morning she did quite well last night and blood sugar came down only slowly overnight with the 0.5 basal rate and no additional bolus late at night Glucose was 143 before breakfast but at mid day she was much higher even with bolusing for her breakfast  She is going to be observed in the hospital another day and apparently has had issues with shortness of breath and continued problems with anemia and mild renal dysfunction  CBG (last 3)   Recent Labs  04/10/13 0351 04/10/13 0818 04/10/13 1130  GLUCAP 215* 143* 270*     Objective: Vital signs in last 24 hours: Temp:  [97.8 F (36.6 C)-98.7 F (37.1 C)] 98 F (36.7 C) (09/19 0756) Pulse Rate:  [79-96] 85 (09/19 0756) Resp:  [16-18] 18 (09/19 0756) BP: (102-139)/(40-70) 113/40 mmHg (09/19 0756) SpO2:  [94 %-98 %] 98 % (09/19 0756) Weight:  [119 lb 11.4 oz (54.3 kg)] 119 lb 11.4 oz (54.3 kg) (09/19 0500) Weight change: -3 lb 4.6 oz (-1.492 kg) Last BM Date: 04/09/13  Intake/Output from previous day: 09/18 0701 - 09/19 0700 In: 615 [P.O.:240; I.V.:375] Out: 700 [Urine:700] Intake/Output this shift: Total I/O In: 360 [P.O.:360] Out: 601 [Urine:600; Stool:1]  General appearance: mild distress  Lab Results:  Recent Labs  04/09/13 0645 04/10/13 0630  WBC 7.6 6.7  HGB 8.9* 8.3*  HCT 29.3* 27.1*  PLT 198 170    BMET  Recent Labs  04/09/13 0645 04/10/13 0630  NA 138 138  K 3.6 4.4  CL 101 103  CO2 26 26  GLUCOSE 101* 151*  BUN 29* 26*  CREATININE 1.33* 1.40*  CALCIUM 9.8 9.7    Studies/Results: No results found.  Medications:  . aspirin EC  81 mg Oral Daily  . calcium-vitamin D  1 tablet Oral BID  . cholecalciferol  1,000 Units Oral Daily  . clopidogrel  75 mg Oral Q breakfast  . ferrous sulfate  325 mg Oral Q breakfast   And  . docusate sodium  100 mg Oral Daily  . ezetimibe  10 mg Oral Daily  . fenofibrate  160 mg Oral Daily  . fluticasone  2 spray Each Nare Daily  . insulin pump   Subcutaneous TID AC, HS, 0200  . isosorbide mononitrate  15 mg Oral Daily  . metoprolol  50 mg Oral QHS  . metoprolol tartrate  25 mg Oral Daily  . pantoprazole  40 mg Oral Q0600  . polyethylene glycol  17 g Oral Daily  . potassium chloride SA  40 mEq Oral Daily  . pravastatin  10 mg Oral Daily    Assessment/Plan:  Although she is still relatively more sensitive to the insulin  compared to previous requirement of nearly 200 units a day with insulin pump her blood sugars are relatively higher since last night  She does have good blood sugars overnight but tends to have  lower readings early morning Today appears to be requiring more insulin during the day and a higher amount of mealtime coverage  NEW basal rate settings will be 0.6 between midnight-4 AM; 0.3 until  7 AM and 2.5 between 7 AM and midnight Her carbohydrate coverage with the 1:5 for breakfast She will continue 1:10 carbohydrate coverage and 1: 25 correction factor until tomorrow and this will be adjusted as needed   LOS: 7 days   Aracelia Brinson 04/10/2013, 12:43 PM

## 2013-04-10 NOTE — Progress Notes (Signed)
Inpatient Diabetes Program Recommendations  AACE/ADA: New Consensus Statement on Inpatient Glycemic Control (2013)  Target Ranges:  Prepandial:   less than 140 mg/dL      Peak postprandial:   less than 180 mg/dL (1-2 hours)      Critically ill patients:  140 - 180 mg/dL   Reason for Visit: Results for [name] [name], [name] [name] (MRN [id number]) as of 04/10/2013 12:14  Ref. Range 04/10/2013 08:18 04/10/2013 11:30  Glucose-Capillary Latest Range: 70-99 mg/dL 143 (H) 270 (H)   CBG's did well overnight.  She is concerned that her CHO coverage of 1 unit for 10 grams of CHO is not enough based on her CBG's rising after breakfast.  Suggested that she discuss with Dr. Dwyane Dee when he rounds.  Will follow-up with patient this afternoon.

## 2013-04-11 ENCOUNTER — Encounter (HOSPITAL_COMMUNITY): Payer: Self-pay | Admitting: Nurse Practitioner

## 2013-04-11 DIAGNOSIS — I200 Unstable angina: Secondary | ICD-10-CM

## 2013-04-11 LAB — BASIC METABOLIC PANEL
CO2: 19 mEq/L (ref 19–32)
Calcium: 9.7 mg/dL (ref 8.4–10.5)
Chloride: 104 mEq/L (ref 96–112)
Glucose, Bld: 155 mg/dL — ABNORMAL HIGH (ref 70–99)
Potassium: 3.9 mEq/L (ref 3.5–5.1)
Sodium: 137 mEq/L (ref 135–145)

## 2013-04-11 LAB — GLUCOSE, CAPILLARY
Glucose-Capillary: 221 mg/dL — ABNORMAL HIGH (ref 70–99)
Glucose-Capillary: 162 mg/dL — ABNORMAL HIGH (ref 70–99)
Glucose-Capillary: 107 mg/dL — ABNORMAL HIGH (ref 70–99)
Glucose-Capillary: 133 mg/dL — ABNORMAL HIGH (ref 70–99)

## 2013-04-11 LAB — CBC
Hemoglobin: 7.9 g/dL — ABNORMAL LOW (ref 12.0–15.0)
MCH: 25.2 pg — ABNORMAL LOW (ref 26.0–34.0)
Platelets: 166 10*3/uL (ref 150–400)
RBC: 3.13 MIL/uL — ABNORMAL LOW (ref 3.87–5.11)
WBC: 7 10*3/uL (ref 4.0–10.5)

## 2013-04-11 MED ORDER — FUROSEMIDE 10 MG/ML IJ SOLN
INTRAMUSCULAR | Status: AC
  Administered 2013-04-11: 11:00:00 20 mg
  Filled 2013-04-11: qty 4

## 2013-04-11 MED ORDER — FUROSEMIDE 10 MG/ML IJ SOLN
20.0000 mg | Freq: Once | INTRAMUSCULAR | Status: AC
  Administered 2013-04-11: 20 mg via INTRAVENOUS

## 2013-04-11 MED ORDER — FERROUS SULFATE 325 (65 FE) MG PO TABS: 325.0000 mg | ORAL_TABLET | Freq: Two times a day (BID) | ORAL | Status: DC

## 2013-04-11 MED ORDER — DOCUSATE SODIUM 100 MG PO CAPS: 100.0000 mg | ORAL_CAPSULE | Freq: Two times a day (BID) | ORAL | Status: DC

## 2013-04-11 MED ORDER — TICAGRELOR 90 MG PO TABS: 90.0000 mg | ORAL_TABLET | Freq: Two times a day (BID) | ORAL | Status: DC

## 2013-04-11 NOTE — Progress Notes (Signed)
   CARE MANAGEMENT NOTE 04/11/2013  Patient:  [name] [name], [name] [name]   Account Number:  [id number]  Date Initiated:  04/09/2013  Documentation initiated by:  Llana Aliment  Subjective/Objective Assessment:   62yo female admitted with CHF.  Pt. lives with spouse.     Action/Plan:   discharge planning   Anticipated DC Date:  04/10/2013   Anticipated DC Plan:  [place]  CM consult      Choice offered to / List presented to:             Status of service:  Completed, signed off Medicare Important Message given?   (If response is "NO", the following Medicare IM given date fields will be blank) Date Medicare IM given:   Date Additional Medicare IM given:    Discharge Disposition:  HOME/SELF CARE  Per UR Regulation:  Reviewed for med. necessity/level of care/duration of stay  If discussed at [place] of Stay Meetings, dates discussed:   04/09/2013    Comments:  04/11/13 08:35 Met with pt and pt's husband in room to give pt Brilinta free trial card and highlighted information for commercial payers [facility]) for future discounts.  No other CM needs were communicated.  Mariane Masters, BSN, Jearld Lesch [phone].  04/09/13 1022 Pt. to have scheduled PCI today.  Pt. may dc home tomorrow if continues to remain stable. Llana Aliment, RN, BSN NCM [phone]

## 2013-04-11 NOTE — Discharge Summary (Signed)
Patient ID: [name] [name],  MRN: [id number], DOB/AGE: [birth year] 60 y.o.  Admit date: 04/03/2013 Discharge date: 04/11/2013  Primary Care Provider: Leonides Grills Primary Cardiologist: Einar Crow, MD / G. Lovena Le, MD (EP)  Discharge Diagnoses Principal Problem:   Unstable angina   **s/p PCI/DES to the LM this admission.  Active Problems:   CAD (coronary artery disease) of artery bypass graft  **Patent VG->RCA on cath this admission.   Iron deficiency anemia  **Relatively normal EGD this admission.  **s/p 1 U PRBC's this admission.   Diabetes mellitus type 2, controlled   Chronic kidney disease, stage 3/cardiorenal syndrome   Hyperlipidemia   Hypertension   Aortic stenosis status post 19 mm Edwards pericardial valve replacement 2011   Gastroesophageal reflux disease   Sick sinus syndrome s/p PPM   Chronic diastolic CHF (congestive heart failure)  **EF 60-65% by echo this admission.  ** net negative of approximately 9 L for this admission and her weight has been reduced from 124 pounds to 121 pounds.  Allergies Allergies  Allergen Reactions  . Clindamycin/Lincomycin Anaphylaxis and Shortness Of Breath  . Penicillins Anaphylaxis, Hives and Rash    Childhood allergy   . Gentamycin [Gentamicin] Hives, Itching and Rash    Reaction noted post loop recorder implant, where gentamycin was used for irrigation. Topical prep  . Tizanidine Other (See Comments)    Hallucinate  . Atorvastatin Other (See Comments)     increased LFT's  . Avandia [Rosiglitazone Maleate]     Congestive heart failure  . Codeine Nausea And Vomiting  . Crestor [Rosuvastatin] Other (See Comments)    Muscle and joint pain &  . Erythromycin Nausea And Vomiting  . Metformin     Congestive heart failure  . Novolog [Insulin Aspart] Itching    Patient reports she has severe itching with Novolog insulin  . Pravastatin Other (See Comments)    In high doses (20-18m) causes muscle and joint pain   .  Simvastatin     Increased LFT's  . Cephalexin Swelling and Rash    Extremities swell   . Iodinated Diagnostic Agents Other (See Comments)    PAIN DURING LYMPHANGIOGRAM '86, NO ALLERGY TO IV CONTRAST  . Neosporin [Neomycin-Bacitracin Zn-Polymyx] Rash   Procedures  EGD 09.15.2014  Endoscopic impression: 1. Essentially normal EGD _____________   Cardiac Catheterization 9.16.2014  Procedural Findings: Hemodynamics RA mean 18 RV 61/18 PA 62/25, mean 43 PCWP mean 26 with prominent v waves to 43.  AO 121/63  Oxygen saturations: PA 60% AO 99%  Cardiac Output (Fick) 4.39  Cardiac Index (Fick) 2.81 PVR 3.89 WU   Coronary angiography: Coronary dominance: right  Left mainstem: Small caliber.  The catheter damped with engagement of the left main and patient developed mild chest pain with contrast injections. 70-80% distal left main with 80% ostial LAD.  The distal left main looked hazy in some views. IC nitroglycerine was used.     **The ostial left main was successfully stented on September 18 using a 2.75 x 24 mm Promus Premier drug-eluting stent, extending into the LAD.**  Left anterior descending (LAD): Ostial 80% stenosis.  Diffuse mild luminal irregularities in the rest of the LAD.    Left circumflex (LCx): No significant disease in the LCx system.   Right coronary artery (RCA): Totally occluded native RCA at ostium.  Patent SVG-PDA backfills to distal RCA and PLV.   Left ventriculography: Not done, CKD.   _____________   2D Echocardiogram 9.17.2014  Study Conclusions  -  Left ventricle: The cavity size was normal. Wall thickness   was normal. Systolic function was normal. The estimated   ejection fraction was in the range of 60% to 65%.   Indeterminant diastolic function. Although no diagnostic   regional wall motion abnormality was identified, this   possibility cannot be completely excluded on the basis of   this study. - Aortic valve: There was a bioprosthetic  aortic valve. No   significant prosthetic valve regurgitation or stenosis.   Mean gradient: 27m Hg (S). - Mitral valve: Severely calcified annulus. Mild to moderate   regurgitation. - Left atrium: The atrium was mildly dilated. - Right ventricle: The cavity size was normal. Systolic   function was normal. - Tricuspid valve: Peak RV-RA gradient:430mHg (S). - Pulmonary arteries: PA peak pressure: 5171mg (S). - Inferior vena cava: The vessel was normal in size; the   respirophasic diameter changes were in the normal range (=   50%); findings are consistent with normal central venous   pressure. _____________   History of Present Illness  [age]ar old female with prior history of coronary artery disease status post vein grafting of the right coronary artery requiring redo vein grafting of the right coronary artery in 2011 at the time of her bioprosthetic aortic valve replacement. Since July of this year, she has had acute on chronic dyspnea on exertion as well as orthopnea and exertional chest tightness.she recently underwent outpatient CPX testing in May showed severe functional limitation with ischemic ECG changes including inferolateral ST depression and ST elevation in V1 V2. She was seen in the office by Dr. McLAundra Dubin September 12 , and a CBC was performed showing a hemoglobin of 7.3. Given her anemia as well as unstable angina and objective evidence of ischemia on her stress testing, decision was made to admit her for further evaluation and transfusion to be followed by cardiac catheterization.  Hospital Course  Patient ruled out for myocardial infarction. As noted above, she was admitted and subsequently transfused with 1 unit of packed red blood cells. She tolerated this well. Her stool was heme positive. Gastroenterology consult was called and recommendation was made for EGD and biopsy. Post transfusion, hemoglobin was stable at 9.2. On September 15, she underwent EGD which was  essentially normal. Biopsy was performed to rule out iron absorption issues. GI recommended continuation of PPI therapy. They also felt it was safe to perform diagnostic catheterization.  Of note, she required endocrinology consultation on September 15 secondary to marked hypoglycemia in the setting of chronic insulin pump therapy. Adjustments were made to her settings and she had no further hypoglycemia. She will followup with her endocrinologist at [facility] management.  Patient underwent diagnostic cardiac catheterization on September 16 revealing severe ostial and distal left main disease with ostial LAD disease. The vein graft to the right coronary artery was widely patent. Right heart pressures were elevated with a pulmonary capillary wedge pressure of 26. Was felt that patient would require diuresis and possibly percutaneous intervention versus redo surgery. She was seen by cardiothoracic surgery who felt that a third redo sternotomy was high risk secondary to prior dense adhesions to her previous surgery as well as significant aortic disease and calcification. Recommendation was made for percutaneous intervention within the left main and ostial LAD.  Patient was placed on intravenous Lasix and a 2-D echocardiogram was carried out showing normal LV function with an appropriately functioning bioprosthetic aortic valve and mild to moderate mitral regurgitation. With diuresis, she  had a net negative of approximately 9 L for this admission and her weight has been reduced from 124 pounds to 121 pounds.   On September 18, she was taken back to the cardiac catheterization laboratory and underwent successful PCI and stenting of the ostial left main using a 2.75 x 24 mm Promus Premier drug-eluting stent which extended into the proximal LAD. She tolerated this procedure well and post procedure has been ambulating without recurrent symptoms or limitations. She has been placed on brilinta therapy in  addition to aspirin. Her hemoglobin and hematocrit have slowly come down and aren't 7.9 and 25.4 today. She has been placed on oral iron therapy and is also maintained on PPI. We plan to discharge her home today in good condition with plan for followup in the next one week with repeat blood count at that time. Our office will also arrange for outpatient hematology referral.  Discharge Vitals Blood pressure 92/37, pulse 79, temperature 98 F (36.7 C), temperature source Oral, resp. rate 16, height 5' 2"  (1.575 m), weight 121 lb 4.1 oz (55 kg), SpO2 98.00%.  Filed Weights   04/10/13 0500 04/11/13 0000 04/11/13 0500  Weight: 119 lb 11.4 oz (54.3 kg) 121 lb 4.1 oz (55 kg) 121 lb 4.1 oz (55 kg)   Labs  CBC  Recent Labs  04/10/13 0630 04/11/13 0610  WBC 6.7 7.0  HGB 8.3* 7.9*  HCT 27.1* 25.4*  MCV 81.6 81.2  PLT 170 390   Basic Metabolic Panel  Recent Labs  04/10/13 0630 04/11/13 0610  NA 138 137  K 4.4 3.9  CL 103 104  CO2 26 19  GLUCOSE 151* 155*  BUN 26* 29*  CREATININE 1.40* 1.31*  CALCIUM 9.7 9.7   Liver Function Tests Lab Results  Component Value Date   ALT 18 04/03/2013   AST 39* 04/03/2013   ALKPHOS 68 04/03/2013   BILITOT 0.3 04/03/2013    Cardiac Enzymes Lab Results  Component Value Date   TROPONINI <0.30 04/04/2013    Fasting Lipid Panel Lab Results  Component Value Date   CHOL 151 04/04/2013   HDL 29* 04/04/2013   LDLCALC 99 04/04/2013   TRIG 116 04/04/2013   CHOLHDL 5.2 04/04/2013    Thyroid Function Tests Lab Results  Component Value Date   TSH 2.385 04/04/2013    Disposition  Pt is being discharged home today in good condition.  Follow-up Plans & Appointments  Follow-up Information   Follow up with [name], MD. (1-2 wks, we will arrange.)    Specialty:  Cardiology   Contact information:   [address] [place] [address] [phone]       Follow up with Leonides Grills, MD. (as scheduled.)    Specialty:   Family Medicine   Contact information:   [address] [place] [address] [phone]     Discharge Medications    Medication List         ALPRAZolam 0.5 MG tablet  Commonly known as:  XANAX  Take 0.25 mg by mouth at bedtime as needed for sleep.     aspirin EC 81 MG tablet  Take 81 mg by mouth daily.     cholecalciferol 1000 UNITS tablet  Commonly known as:  VITAMIN D  Take 1,000 Units by mouth daily.     desoximetasone 0.05 % cream  Commonly known as:  TOPICORT  Apply 1 application topically 2 (two) times daily as needed. When  skin starts peeling     docusate sodium 100 MG capsule  Commonly known as:  COLACE  Take 1 capsule (100 mg total) by mouth 2 (two) times daily.     ezetimibe 10 MG tablet  Commonly known as:  ZETIA  Take 10 mg by mouth at bedtime.     fenofibrate 145 MG tablet  Commonly known as:  TRICOR  Take 145 mg by mouth at bedtime.     ferrous sulfate 325 (65 FE) MG tablet  Take 1 tablet (325 mg total) by mouth 2 (two) times daily.     fluticasone 50 MCG/ACT nasal spray  Commonly known as:  FLONASE  Place 2 sprays into the nose daily as needed for rhinitis. For nasal congestion     furosemide 40 MG tablet  Commonly known as:  LASIX  Take 60 mg by mouth daily.     GLUCAGON EMERGENCY 1 MG injection  Generic drug:  glucagon     insulin pump 100 unit/ml Soln  - Inject into the skin continuous. Apidra  - Basal Rate: Midnight-1100=2.5  - 1100-1300=4.2  - 1300-1700=4.3  - 1700-midnight=4.5     isosorbide mononitrate 30 MG 24 hr tablet  Commonly known as:  IMDUR  Take 15 mg by mouth at bedtime.     metoprolol 50 MG tablet  Commonly known as:  LOPRESSOR  Take 25-50 mg by mouth 2 (two) times daily. Takes 29m in the am & 513min the pm     multivitamin with minerals tablet  Take 1 tablet by mouth daily.     NITROLINGUAL 0.4 MG/SPRAY spray  Generic drug:  nitroGLYCERIN  USE ONE SPRAY UNDER TONGUE EVERY 5  MINUTES AS NEEDED FOR CHEST PAIN. MAY REPEAT THREE TIMES     omeprazole 20 MG tablet  Commonly known as:  PRILOSEC OTC  Take 20 mg by mouth daily.     polyethylene glycol packet  Commonly known as:  MIRALAX / GLYCOLAX  Take 8.5 g by mouth daily as needed. For constipation     potassium chloride SA 20 MEQ tablet  Commonly known as:  K-DUR,KLOR-CON  Take 40 mEq by mouth daily.     pravastatin 10 MG tablet  Commonly known as:  PRAVACHOL  Take 10 mg by mouth at bedtime.     RA CALCIUM PLUS VITAMIN D 600-400 MG-UNIT per tablet  Generic drug:  Calcium Carbonate-Vitamin D  Take 1 tablet by mouth 2 (two) times daily.     Ticagrelor 90 MG Tabs tablet  Commonly known as:  BRILINTA  Take 1 tablet (90 mg total) by mouth 2 (two) times daily.      Outstanding Labs/Studies  CBC @ follow-up appt. Pending Hematology referral.  Duration of Discharge Encounter   Greater than 30 minutes including physician time.  Signed, ChMurray HodgkinsP 04/11/2013, 10:06 AM

## 2013-04-11 NOTE — Progress Notes (Signed)
Patient ID: [name] [name], female   DOB: 22-Mar-1951, 62 y.o.   MRN: [id number] Subjective:  No chest pain or sob  Objective:  Vital Signs in the last 24 hours: Temp:  [98 F (36.7 C)-98.5 F (36.9 C)] 98 F (36.7 C) (09/20 0742) Pulse Rate:  [79-94] 79 (09/20 0742) Resp:  [14-18] 16 (09/20 0742) BP: (92-130)/(37-52) 92/37 mmHg (09/20 0742) SpO2:  [96 %-98 %] 98 % (09/20 0742) Weight:  [121 lb 4.1 oz (55 kg)] 121 lb 4.1 oz (55 kg) (09/20 0500)  Intake/Output from previous day: 09/19 0701 - 09/20 0700 In: 1080 [P.O.:1080] Out: 2101 [Urine:2100; Stool:1] Intake/Output from this shift:    Physical Exam: Well appearing NAD HEENT: Unremarkable Neck:  No JVD, no thyromegally Back:  No CVA tenderness Lungs:  Clear except for basilar rales HEART:  Regular rate rhythm, no murmurs, no rubs, no clicks Abd:  Flat, positive bowel sounds, no organomegally, no rebound, no guarding Ext:  2 plus pulses, no edema, no cyanosis, no clubbing Skin:  No rashes no nodules Neuro:  CN II through XII intact, motor grossly intact  Lab Results:  Recent Labs  04/10/13 0630 04/11/13 0610  WBC 6.7 7.0  HGB 8.3* 7.9*  PLT 170 166    Recent Labs  04/09/13 0645 04/10/13 0630  NA 138 138  K 3.6 4.4  CL 101 103  CO2 26 26  GLUCOSE 101* 151*  BUN 29* 26*  CREATININE 1.33* 1.40*   No results found for this basename: TROPONINI, CK, MB,  in the last 72 hours Hepatic Function Panel No results found for this basename: PROT, ALBUMIN, AST, ALT, ALKPHOS, BILITOT, BILIDIR, IBILI,  in the last 72 hours No results found for this basename: CHOL,  in the last 72 hours No results found for this basename: PROTIME,  in the last 72 hours  Imaging: No results found.  Cardiac Studies: Tele - nsr Assessment/Plan:  1. Chronic coronary disease 2. Crescendo angina 3. S/p LM stent insertion into the LAD 4. Anemia with no obvious source, s/p transfusion 5. DM 6. Stage 2 renal insufficiency Rec; await  bmp. If creatinine stable, will give lasix 20 mg iv prior to discharge home. Her hgb is down a bit. This will need careful followup. Will check early next week. She will be discharge on daily brilanta.  [name] [name],M.D.  LOS: 8 days    [name] [name] 04/11/2013, 8:18 AM

## 2013-04-11 NOTE — Progress Notes (Signed)
CARDIAC REHAB PHASE I   PRE:  Rate/Rhythm: 85 SR  BP:  Supine:   Sitting: 136/81  Standing:    SaO2:   MODE:  Ambulation: 300 ft   POST:  Rate/Rhythem: 87  BP:  Supine:   Sitting: 127/84  Standing:    SaO2:  9:45 am to 10:15 am  Patient ambulated with assist x 1.   Rested x 3 due to dyspnea.  States this is improved since procedure was done.  Reinforced education.  Plans discharge today. Leverne Humbles RN  Erroll Luna, Gretta Cool

## 2013-04-20 ENCOUNTER — Other Ambulatory Visit: Payer: Self-pay | Admitting: Oncology

## 2013-04-20 ENCOUNTER — Other Ambulatory Visit: Payer: Self-pay | Admitting: *Deleted

## 2013-04-20 ENCOUNTER — Encounter: Payer: Self-pay | Admitting: Nurse Practitioner

## 2013-04-20 ENCOUNTER — Ambulatory Visit (INDEPENDENT_AMBULATORY_CARE_PROVIDER_SITE_OTHER): Payer: BC Managed Care – PPO | Admitting: Nurse Practitioner

## 2013-04-20 ENCOUNTER — Encounter: Payer: Self-pay | Admitting: Internal Medicine

## 2013-04-20 ENCOUNTER — Telehealth: Payer: Self-pay | Admitting: Oncology

## 2013-04-20 ENCOUNTER — Telehealth: Payer: Self-pay | Admitting: *Deleted

## 2013-04-20 ENCOUNTER — Encounter: Payer: Self-pay | Admitting: Cardiology

## 2013-04-20 ENCOUNTER — Other Ambulatory Visit: Payer: BC Managed Care – PPO | Admitting: *Deleted

## 2013-04-20 ENCOUNTER — Ambulatory Visit (INDEPENDENT_AMBULATORY_CARE_PROVIDER_SITE_OTHER): Payer: BC Managed Care – PPO | Admitting: Physician Assistant

## 2013-04-20 ENCOUNTER — Encounter: Payer: Self-pay | Admitting: Physician Assistant

## 2013-04-20 VITALS — BP 126/50 | HR 88 | Ht 62.6 in | Wt 124.2 lb

## 2013-04-20 VITALS — BP 140/70 | HR 78 | Ht 62.5 in | Wt 124.8 lb

## 2013-04-20 DIAGNOSIS — D509 Iron deficiency anemia, unspecified: Secondary | ICD-10-CM

## 2013-04-20 DIAGNOSIS — D649 Anemia, unspecified: Secondary | ICD-10-CM

## 2013-04-20 DIAGNOSIS — E611 Iron deficiency: Secondary | ICD-10-CM

## 2013-04-20 DIAGNOSIS — Z7901 Long term (current) use of anticoagulants: Secondary | ICD-10-CM

## 2013-04-20 DIAGNOSIS — I350 Nonrheumatic aortic (valve) stenosis: Secondary | ICD-10-CM

## 2013-04-20 DIAGNOSIS — I359 Nonrheumatic aortic valve disorder, unspecified: Secondary | ICD-10-CM

## 2013-04-20 LAB — CBC WITH DIFFERENTIAL/PLATELET
HCT: 28.3 % — ABNORMAL LOW (ref 36.0–46.0)
Hemoglobin: 9.1 g/dL — ABNORMAL LOW (ref 12.0–15.0)
Lymphocytes Relative: 16 % (ref 12–46)
Lymphs Abs: 1.3 10*3/uL (ref 0.7–4.0)
MCH: 26 pg (ref 26.0–34.0)
MCHC: 32.2 g/dL (ref 30.0–36.0)
MCV: 80.9 fL (ref 78.0–100.0)
Monocytes Absolute: 0.4 10*3/uL (ref 0.1–1.0)
Monocytes Relative: 6 % (ref 3–12)
Neutro Abs: 6.3 10*3/uL (ref 1.7–7.7)
Neutrophils Relative %: 78 % — ABNORMAL HIGH (ref 43–77)
Platelets: 207 10*3/uL (ref 150–400)
RBC: 3.5 MIL/uL — ABNORMAL LOW (ref 3.87–5.11)
RDW: 22.8 % — ABNORMAL HIGH (ref 11.5–15.5)
WBC: 8.1 10*3/uL (ref 4.0–10.5)

## 2013-04-20 LAB — BASIC METABOLIC PANEL
BUN: 35 mg/dL — ABNORMAL HIGH (ref 6–23)
CO2: 27 mEq/L (ref 19–32)
Calcium: 10 mg/dL (ref 8.4–10.5)
Chloride: 109 mEq/L (ref 96–112)
Creat: 1.4 mg/dL — ABNORMAL HIGH (ref 0.50–1.10)
Glucose, Bld: 125 mg/dL — ABNORMAL HIGH (ref 70–99)
Potassium: 4 mEq/L (ref 3.5–5.3)
Sodium: 143 mEq/L (ref 135–145)

## 2013-04-20 MED ORDER — ISOSORBIDE MONONITRATE ER 30 MG PO TB24: 30.0000 mg | ORAL_TABLET | Freq: Every day | ORAL | Status: DC

## 2013-04-20 MED ORDER — FUROSEMIDE 40 MG PO TABS: 60.0000 mg | ORAL_TABLET | Freq: Every day | ORAL | Status: DC

## 2013-04-20 NOTE — Progress Notes (Addendum)
[name] [name] Date of Birth: 02-19-1951 Medical Record #[id number]  History of Present Illness: [name] [name] is seen back today for a post hospital visit. This is a TCM visit. Seen for Dr. Aundra Dubin. Has CAD with past CABG with SVG to the RCA with redo vein grafting of the RCA with her AVR in 2011, iron deficiency anemia, trype 2 DM, CKD, HLD, HTN, aortic stenosis with prior AVR in 2011, GERD, SSS with PPM in place, chronic diastolic HF with EF of 60 to 65%. 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   Has had an abnormal CPX from May showing severe functional limitation with ischemic ECG changes. Seen here on September 12 and CBC showed HGB down to 7.3. Admitted for further evaluation.   Ruled out for an MI. Stool was heme positive. EGD was essentially normal. PPI therapy and iron was continued. Needs hematology referral. Underwent cardiac cath and had staged PCI within the left main and ostial LAD after surgical consult with [facility] - felt to be too high risk for a 3rd redo surgery. Was diuresed as well.   Has not had her follow up labs nor a referral for hematology since discharge.   Comes in today. Here alone. Has been home about a week. Not doing as well as she thought she should be doing. Still with some chest tightness and shortness of breath. Stools are black. Now on iron. Was short of breath with lying down Saturday night and used NTG spray with good results. Imdur has caused headaches in the past but still able to take a very low dose. Blood sugars are all over the place. She remains on her Brilinta. Some bruising noted. Used to see Dr. Marko Plume for her cancer follow up. Sees Dr. [name] for her GI issues. Reports a negative colonoscopy from December a year ago.   Current Outpatient Prescriptions  Medication Sig Dispense Refill  . ALPRAZolam (XANAX) 0.5 MG tablet Take 0.25 mg by mouth at bedtime as needed for sleep.      Marland Kitchen  aspirin EC 81 MG tablet Take 81 mg by mouth daily.      . Calcium Carbonate-Vitamin D (RA CALCIUM PLUS VITAMIN D) 600-400 MG-UNIT per tablet Take 1 tablet by mouth 2 (two) times daily.       . cholecalciferol (VITAMIN D) 1000 UNITS tablet Take 1,000 Units by mouth daily.        Marland Kitchen desoximetasone (TOPICORT) 0.05 % cream Apply 1 application topically 2 (two) times daily as needed. When skin starts peeling      . docusate sodium (COLACE) 100 MG capsule Take 1 capsule (100 mg total) by mouth 2 (two) times daily.  60 capsule  3  . ezetimibe (ZETIA) 10 MG tablet Take 10 mg by mouth at bedtime.      . fenofibrate (TRICOR) 145 MG tablet Take 145 mg by mouth at bedtime.      . ferrous sulfate 325 (65 FE) MG tablet Take 1 tablet (325 mg total) by mouth 2 (two) times daily.  60 tablet  3  . fluticasone (FLONASE) 50 MCG/ACT nasal spray Place 2 sprays into the nose daily as needed for rhinitis. For nasal congestion      . furosemide (LASIX) 40 MG tablet Take 60 mg by mouth daily.      Marland Kitchen GLUCAGON EMERGENCY 1 MG injection       . Insulin Human (INSULIN PUMP) 100 unit/ml  SOLN Inject into the skin continuous. Apidra Basal Rate: Midnight-1100=2.5 1100-1300=4.2 1300-1700=4.3 1700-midnight=4.5      . isosorbide mononitrate (IMDUR) 30 MG 24 hr tablet Take 15 mg by mouth at bedtime.       . magnesium 30 MG tablet Take 250 mg by mouth daily.      . metoprolol (LOPRESSOR) 50 MG tablet Take 25-50 mg by mouth 2 (two) times daily. Takes 29m in the am & 534min the pm      . Multiple Vitamins-Minerals (MULTIVITAMIN WITH MINERALS) tablet Take 1 tablet by mouth daily.        . Marland KitchenITROLINGUAL 0.4 MG/SPRAY spray USE ONE SPRAY UNDER TONGUE EVERY 5 MINUTES AS NEEDED FOR CHEST PAIN. MAY REPEAT THREE TIMES  4.9 g  3  . omeprazole (PRILOSEC OTC) 20 MG tablet Take 20 mg by mouth daily.      . polyethylene glycol (MIRALAX / GLYCOLAX) packet Take 8.5 g by mouth daily as needed. For constipation      . potassium chloride SA  (K-DUR,KLOR-CON) 20 MEQ tablet Take 40 mEq by mouth daily.      . pravastatin (PRAVACHOL) 10 MG tablet Take 10 mg by mouth at bedtime.      . Ticagrelor (BRILINTA) 90 MG TABS tablet Take 1 tablet (90 mg total) by mouth 2 (two) times daily.  60 tablet  6  . vitamin C (ASCORBIC ACID) 500 MG tablet Take 500 mg by mouth daily.       No current facility-administered medications for this visit.    Allergies  Allergen Reactions  . Clindamycin/Lincomycin Anaphylaxis and Shortness Of Breath  . Penicillins Anaphylaxis, Hives and Rash    Childhood allergy   . Gentamycin [Gentamicin] Hives, Itching and Rash    Reaction noted post loop recorder implant, where gentamycin was used for irrigation. Topical prep  . Tizanidine Other (See Comments)    Hallucinate  . Atorvastatin Other (See Comments)     increased LFT's  . Avandia [Rosiglitazone Maleate]     Congestive heart failure  . Codeine Nausea And Vomiting  . Crestor [Rosuvastatin] Other (See Comments)    Muscle and joint pain &  . Erythromycin Nausea And Vomiting  . Metformin     Congestive heart failure  . Novolog [Insulin Aspart] Itching    Patient reports she has severe itching with Novolog insulin  . Plavix [Clopidogrel Bisulfate]     P2Y12 testing = 271 while on Plavix  . Pravastatin Other (See Comments)    In high doses (20-4024mcauses muscle and joint pain   . Simvastatin     Increased LFT's  . Cephalexin Swelling and Rash    Extremities swell   . Iodinated Diagnostic Agents Other (See Comments)    PAIN DURING LYMPHANGIOGRAM '86, NO ALLERGY TO IV CONTRAST  . Neosporin [Neomycin-Bacitracin Zn-Polymyx] Rash    Past Medical History  Diagnosis Date  . Aortic stenosis     a. bicuspid aortic valve; mean gradient of 20 mmHg in 2/10; b. s/p bioprosth AVR (19-mm Edwards pericardial valve) with a redo coronary artery bypass graft procedure in 07/2009; postoperative Dressler's syndrome    . Coronary artery disease     a. s/p RCA  saphenous vein graft in 06/1999; b. multiple preceding PCIs with restenosis; c. total obstruction of RCA in 2006 with patent graft; d. 07/1999 Redo VG->RCA;  e. 03/2013 Cath/PCI: LM 80ost/d (2.75x24 Promus Premier DES), LAD 80ost, LCx nl, RCA 100, VG->PDA ok.  . Chronic diastolic CHF (  congestive heart failure)     a. 9.2014 EchoP EF 60-65%, no rwma, bioprosth AVR, mean gradient of 51mHg, mild to mod MR, PASP 542mg.  . Marland Kitchenyperlipidemia   . Hypertension   . Small bowel obstruction     Recurrent; resolved after resection of a lipoma  . Osteopenia      hip on DEXA in October 2007.  . Marland Kitchennemia   . Carcinoma, renal cell 05/2007    Laparoscopic right nephrectomy  . NASH (nonalcoholic steatohepatitis) 1999    -biopsy in 1999  . Diabetes mellitus 03/2010     TYPE II: Hemoglobin A1c of 7.4 in  03/2010; 8.4 in 06/2010; treated with insulin pump  . Migraines   . Duodenal ulcer     Remote; H. pylori positive  . Colitis, ischemic 2008  . Gastroesophageal reflux disease     ? of GASTROPARESIS; IRRITABLE BOWEL SYNDROME  . Hodgkin's disease 1991    Mantle radiation therapy  . Exertional dyspnea     chest discomfort  . Statin intolerance   . Hypercalcemia     Ca-11.1 in 07/2011  . Vitamin D deficiency   . Costochondritis   . Insomnia   . Iron deficiency anemia     a. 9.2014 EGD: essentially normal.  . Gastroparesis due to DM 05/01/2012  . PONV (postoperative nausea and vomiting)   . Hyperplastic gastric polyp 06/23/2012  . Arthritis   . Osteoporosis   . Pacemaker 08/21/2012    Medtronic Adapta L dual-chamber pacemaker-Dr. TaLovena Le. Anxiety     Past Surgical History  Procedure Laterality Date  . Total abdominal hysterectomy w/ bilateral salpingoophorectomy  2000  . Breast excisional biopsy      benign, bilateral  . Nephrectomy  2008    Laparoscopic right nephrectomy, renal cell carcinoma  . Coronary artery bypass graft  2005, 2011    CABG-most recent SVG to RCA-12/05; RCA occluded with  patent graft in 2006; Redo bypass in 07/2009  . Tumor excision  1981    removal of Hodgkins lymphoma  . Exploratory laparotomy w/ bowel resection      Small bowel resection of lipoma  . Aortic valve replacement  2011    Aortic valve replacement surgery with a 19 mm bioprosthetic device post redo CABG January 2011.  . Colonoscopy      Multiple with adenomatous polyps  . Cervical biopsy    . Liver biopsy    . Bone marrow biopsy    . Esophagogastroduodenoscopy    . Esophagogastroduodenoscopy  06/23/2012    Procedure: ESOPHAGOGASTRODUODENOSCOPY (EGD);  Surgeon: CaGatha MayerMD;  [facility];  Service: Endoscopy;  Laterality: N/A;  . Colonoscopy  06/23/2012    Procedure: COLONOSCOPY;  Surgeon: CaGatha MayerMD;  Location: [facility];  Service: Endoscopy;  Laterality: N/A;  . Venia Minksilation  06/23/2012    Procedure: MAVenia MinksILATION;  Surgeon: CaGatha MayerMD;  Location: [facility]COPY;  Service: Endoscopy;;  54 fr  . Pacemaker insertion  08/21/2012    Medtronic Adapta L dual-chamber pacemaker  . Esophagogastroduodenoscopy N/A 04/06/2013    Procedure: ESOPHAGOGASTRODUODENOSCOPY (EGD);  Surgeon: JoIrene ShipperMD;  [facility];  Service: Endoscopy;  Laterality: N/A;    History  Smoking status  . Never Smoker   Smokeless tobacco  . Never Used    History  Alcohol Use No    Family History  Problem Relation Age of Onset  . Heart attack Mother   . Heart disease Mother   .  Heart attack Father   . Diabetes Father   . Heart disease Father   . Hypertension Brother   . Glaucoma Brother   . Lung cancer Sister     meta  . Cancer Sister     breast - half sister (Paternal)  . Ovarian cancer Sister   . Cancer Sister     cervical and lung - half sister (maternal)  . Breast cancer Sister   . Colon cancer Neg Hx   . Stomach cancer Neg Hx     Review of Systems: The review of systems is per the HPI.  All other systems were reviewed and are negative.  Physical  Exam: Ht 5' 2.5" (1.588 m)  Wt 124 lb 12.8 oz (56.609 kg)  BMI 22.45 kg/m2 Patient is very pleasant and in no acute distress. Skin is warm and dry. Color is normal.  HEENT is unremarkable. Normocephalic/atraumatic. PERRL. Sclera are nonicteric. Neck is supple. No masses. No JVD. Lungs are clear. Cardiac exam shows a regular rate and rhythm. Abdomen is soft. Extremities are without edema. Gait and ROM are intact. No gross neurologic deficits noted.  LABORATORY DATA: EKG today shows sinus brady with a long first degree AV block.   Lab Results  Component Value Date   WBC 7.0 04/11/2013   HGB 7.9* 04/11/2013   HCT 25.4* 04/11/2013   PLT 166 04/11/2013   GLUCOSE 155* 04/11/2013   CHOL 151 04/04/2013   TRIG 116 04/04/2013   HDL 29* 04/04/2013   LDLCALC 99 04/04/2013   ALT 18 04/03/2013   AST 39* 04/03/2013   NA 137 04/11/2013   K 3.9 04/11/2013   CL 104 04/11/2013   CREATININE 1.31* 04/11/2013   BUN 29* 04/11/2013   CO2 19 04/11/2013   TSH 2.385 04/04/2013   INR 1.11 04/09/2013   HGBA1C 6.3* 04/04/2013   Coronary angiography:  Coronary dominance: right  Left mainstem: Small caliber. The catheter damped with engagement of the left main and patient developed mild chest pain with contrast injections. 70-80% distal left main with 80% ostial LAD. The distal left main looked hazy in some views. IC nitroglycerine was used.  Left anterior descending (LAD): Ostial 80% stenosis. Diffuse mild luminal irregularities in the rest of the LAD.  Left circumflex (LCx): No significant disease in the LCx system.  Right coronary artery (RCA): Totally occluded native RCA at ostium. Patent SVG-PDA backfills to distal RCA and PLV.  Left ventriculography: Not done, CKD.  Final Conclusions: Patent SVG-PDA with 70-80% distal LM disease extending into the ostial LAD. The catheter waveform ventricularized with engagement of the left main and the patient had chest pain with injections. The patient also has elevated left and right  heart filling pressures with moderate pulmonary hypertension (primarily pulmonary venous hypertension). EF normal from prior echo in 4/14.  Difficult situation. Patient has had 2 prior sternotomies as well as chest radiation for lymphoma. She has iron deficiency anemia and has had transfusion this admission. She has CKD. A third sternotomy would be high risk and she has ascending aorta calcification. Conversely, PCI to address left main and ostial LAD would be high risk (unprotected left main) especially given concern for iron deficiency anemia and presumed slow GI blood loss (EGD unremarkable this admission). I will have CVTS evaluate (Dr. Cyndia Bent operated on her in the past). I will discuss the situation with him and will need to decide on revascularization approach.  Given CKD, will very gently hydrate (elevated filling pressures). If stable tomorrow,  begin diuresis.  Needs echo to reassess LV systolic function, aortic valve, and mitral valve (prominent v-waves). Will also look for any evidence of pericardial constriction by echo.  [name]  04/07/2013  3:32 PM   PCI Procedure Details:  The risks, benefits, complications, treatment options, and expected outcomes were discussed with the patient. The patient and/or family concurred with the proposed plan, giving informed consent. The patient was brought to the cath lab after IV hydration was begun and oral premedication was given. The patient was further sedated with Versed and Fentanyl. The right groin was prepped and draped in the usual manner. Using the modified Seldinger access technique, a 6 French sheath was placed in the right femoral artery. She was given a weight based bolus of Angiomax and a drip was started. She had been given 300 mg Plavix po x 1 yesterday. I engaged the left main with a XB LAD 3.5 guiding catheter with sideholes. When the ACT was greater than 200, I passed a BMW wire down the LAD. A 2.0 x 15 mm balloon was used to  pre-dilate the distal left main into the LAD x 1. I then carefully positioned and deployed a 2.75 x 24 mm Promus Premier DES in the ostium of the left main extending down into the proximal LAD. I then passed an IVUS catheter into the LAD and performed automated pullback visualizing the stented segment. This was used to size the post-dilatation balloon. A 3.0 x 15 mm [place] balloon was inflated to 12 atm in the distal stented segment and 18 atm in the proximal stented segment. There was an excellent angiographic result. The stenosis was taken from 90% down to 0%. There was excellent flow down the Circumflex artery.  There were no immediate complications. The patient was taken to the recovery area in stable condition.  Hemodynamic Findings:  Central aortic pressure: 117/59  Impression:  1. Unstable angina  2. Successful PTCA/DES x 1 left main coronary artery into the LAD  Recommendations: She will need lifelong dual antiplatelet therapy with ASA and Plavix. Continue aggressive risk factor modification. Continue other cardiac meds.  Complications: None; patient tolerated the procedure well.  Echo Study Conclusions  - Left ventricle: The cavity size was normal. Wall thickness was normal. Systolic function was normal. The estimated ejection fraction was in the range of 60% to 65%. Indeterminant diastolic function. Although no diagnostic regional wall motion abnormality was identified, this possibility cannot be completely excluded on the basis of this study. - Aortic valve: There was a bioprosthetic aortic valve. No significant prosthetic valve regurgitation or stenosis. Mean gradient: 7m Hg (S). - Mitral valve: Severely calcified annulus. Mild to moderate regurgitation. - Left atrium: The atrium was mildly dilated. - Right ventricle: The cavity size was normal. Systolic function was normal. - Tricuspid valve: Peak RV-RA gradient:427mHg (S). - Pulmonary arteries: PA peak pressure: 51106mg  (S). - Inferior vena cava: The vessel was normal in size; the respirophasic diameter changes were in the normal range (= 50%); findings are consistent with normal central venous pressure.   Assessment / Plan: 1. Unstable angina - known CAD - s/p PCI with DES to the left main - symptoms have improved but certainly not resolved. She is on Brilinta and will be committed to DAPT for lifelong use. I suspect most of her issues are centered around the anemia. Imdur is increased to 30 mg a day. She is to continue to use her NTG spray as well.   2.  Anemia - iron deficient - needs to see hematology - needs her repeat labs - may even need repeat transfusion - I am checking labs today and have made the referral as well. If she is still in fact bleeding (did have heme positive stool on admission) this is going to be quite a challenge now that she has a DES stent in place - I think she needs to get back to GI for their input.   3. CKD - recheck labs today.   4. Diastolic HF - normal EF - weight up slightly - again I think this is more a reflection of her anemia.   Further disposition to follow.   Patient is agreeable to this plan and will call if any problems develop in the interim.   Burtis Junes, RN, ANP-C [facility] [address] [place], [place]  [id number]   Addendum: CBC received - hemoglobin up to 9.1. Has appointment to see GI later today. Referral for hematology in process. Imdur is increased. Will see her back in 2 weeks on a day that Dr. Aundra Dubin is here.   Patient is agreeable to this plan and will call if any problems develop in the interim.    Addendum: Phone call from Dr. Marko Plume, who is back seeing patients - she will see Ms. Rubens next week.

## 2013-04-20 NOTE — Telephone Encounter (Signed)
Medical Oncology   Phone call from [facility] requesting visit ASAP due to anemia. Hgb 9.1 today, up from ~ 7 during recent admission. To be seen by GI this afternoon. Had heme + stool in hospital per cardiology (not showing in Results Review that I find). Cardiology NP fine with appointment with this MD on Oct 6 as next available, aware that other MDs or patient can call prior if needed.  POF to [facility] scheduler now.  Godfrey Pick, MD

## 2013-04-20 NOTE — Progress Notes (Signed)
Subjective:    Patient ID: [name] [name], female    DOB: April 08, 1951, 62 y.o.   MRN: [id number]  HPI  [name] [name]  Is a very complicated, very nice 62 year old white female known to Dr. [name]. She had a recent hospitalization with chest pain and anemia. Patient has history of coronary artery disease with prior CABG and aortic valve replacement in 2011, she has history of insulin-dependent diabetes mellitus with an insulin pump currently, chronic kidney disease, hyperlipidemia, hypertension, aortic stenosis, chronic GERD, sick sinus syndrome status post pacemaker and chronic diastolic heart failure with most recent EF around 60%. She has remote history of Hodgkin's disease and also a renal cell carcinoma for which she is status post right nephrectomy. She had radiation for the Hodgkin's disease and this is felt to be partially responsible for her cardiac disease. She ruled out for MI with recent hospitalization, did have GI consultation for hemoglobin in the 7 range and found to be iron deficient and was Hemoccult-positive on rectal exam. She had undergone colonoscopy with Dr. [name] in December of 2013 because of history of adenomatous polyps but was only found to have mild diverticulosis. Her hospital stay she had upper endoscopy with Dr. Henrene Pastor on 04/06/2013 and this was a normal exam. Hematology consult was recommended, Patient was transfused 2 units of packed rbc's. She underwent cardiac catheter and had a PCI to LAD and ostial LAD with 2 drug-eluting stents. CVTS  Was consuled, however she is felt to be too high risk for a third redo surgery.she is now on Brillinta  and aspirin and will need this indefinitely  She has still been having some chest pain after discharge from the hospital which is relieved by nitroglycerin. She was seen by cardiology earlier this morning and her Imdur was increased. Repeat CBC was done and hemoglobin on 04/11/2013 with 7.9 and hemoglobin today is up to 9.1 with hematocrit  of 28.3 Patient says her last episode was of chest pain was on Saturday evening which was definitely relieved by nitroglycerin. She says overall she feels fatigued and doesn't feel that she's bounced back. She has been taking iron supplements and her stools are dark she has no complaints of abdominal pain. She says she thought she saw one little tiny spot of blood on the tissue the other day. Plan is for her to be seen by Dr. Marko Plume next week and also to have cardiac followup next week.    Review of Systems  Constitutional: Positive for activity change and fatigue.  HENT: Negative.   Eyes: Negative.   Respiratory: Positive for shortness of breath.   Cardiovascular: Positive for chest pain.  Gastrointestinal: Negative.   Endocrine: Negative.   Genitourinary: Negative.   Musculoskeletal: Negative.   Skin: Negative.   Allergic/Immunologic: Negative.   Neurological: Negative.   Hematological: Negative.   Psychiatric/Behavioral: Negative.    Outpatient Prescriptions Prior to Visit  Medication Sig Dispense Refill  . ALPRAZolam (XANAX) 0.5 MG tablet Take 0.25 mg by mouth at bedtime as needed for sleep.      Marland Kitchen aspirin EC 81 MG tablet Take 81 mg by mouth daily.      . Calcium Carbonate-Vitamin D (RA CALCIUM PLUS VITAMIN D) 600-400 MG-UNIT per tablet Take 1 tablet by mouth 2 (two) times daily.       . cholecalciferol (VITAMIN D) 1000 UNITS tablet Take 1,000 Units by mouth daily.        Marland Kitchen desoximetasone (TOPICORT) 0.05 % cream Apply 1 application topically 2 (  two) times daily as needed. When skin starts peeling      . docusate sodium (COLACE) 100 MG capsule Take 1 capsule (100 mg total) by mouth 2 (two) times daily.  60 capsule  3  . ezetimibe (ZETIA) 10 MG tablet Take 10 mg by mouth at bedtime.      . fenofibrate (TRICOR) 145 MG tablet Take 145 mg by mouth at bedtime.      . ferrous sulfate 325 (65 FE) MG tablet Take 1 tablet (325 mg total) by mouth 2 (two) times daily.  60 tablet  3  .  fluticasone (FLONASE) 50 MCG/ACT nasal spray Place 2 sprays into the nose daily as needed for rhinitis. For nasal congestion      . furosemide (LASIX) 40 MG tablet Take 1.5 tablets (60 mg total) by mouth daily.  90 tablet  11  . GLUCAGON EMERGENCY 1 MG injection       . Insulin Human (INSULIN PUMP) 100 unit/ml SOLN Inject into the skin continuous. Apidra Basal Rate: Midnight-1100=2.5 1100-1300=4.2 1300-1700=4.3 1700-midnight=4.5      . isosorbide mononitrate (IMDUR) 30 MG 24 hr tablet Take 1 tablet (30 mg total) by mouth at bedtime.      . metoprolol (LOPRESSOR) 50 MG tablet Take 25-50 mg by mouth 2 (two) times daily. Takes 66m in the am & 568min the pm      . Multiple Vitamins-Minerals (MULTIVITAMIN WITH MINERALS) tablet Take 1 tablet by mouth daily.        . Marland KitchenITROLINGUAL 0.4 MG/SPRAY spray USE ONE SPRAY UNDER TONGUE EVERY 5 MINUTES AS NEEDED FOR CHEST PAIN. MAY REPEAT THREE TIMES  4.9 g  3  . omeprazole (PRILOSEC OTC) 20 MG tablet Take 20 mg by mouth daily.      . polyethylene glycol (MIRALAX / GLYCOLAX) packet Take 8.5 g by mouth daily as needed. For constipation      . potassium chloride SA (K-DUR,KLOR-CON) 20 MEQ tablet Take 40 mEq by mouth daily.      . pravastatin (PRAVACHOL) 10 MG tablet Take 10 mg by mouth at bedtime.      . Ticagrelor (BRILINTA) 90 MG TABS tablet Take 1 tablet (90 mg total) by mouth 2 (two) times daily.  60 tablet  6  . vitamin C (ASCORBIC ACID) 500 MG tablet Take 500 mg by mouth daily.      . magnesium 30 MG tablet Take 250 mg by mouth daily.       No facility-administered medications prior to visit.   Allergies  Allergen Reactions  . Clindamycin/Lincomycin Anaphylaxis and Shortness Of Breath  . Penicillins Anaphylaxis, Hives and Rash    Childhood allergy   . Gentamycin [Gentamicin] Hives, Itching and Rash    Reaction noted post loop recorder implant, where gentamycin was used for irrigation. Topical prep  . Tizanidine Other (See Comments)    Hallucinate   . Atorvastatin Other (See Comments)     increased LFT's  . Avandia [Rosiglitazone Maleate]     Congestive heart failure  . Codeine Nausea And Vomiting  . Crestor [Rosuvastatin] Other (See Comments)    Muscle and joint pain &  . Erythromycin Nausea And Vomiting  . Metformin     Congestive heart failure  . Novolog [Insulin Aspart] Itching    Patient reports she has severe itching with Novolog insulin  . Plavix [Clopidogrel Bisulfate]     P2Y12 testing = 271 while on Plavix  . Pravastatin Other (See Comments)    In  high doses (20-52m) causes muscle and joint pain   . Simvastatin     Increased LFT's  . Cephalexin Swelling and Rash    Extremities swell   . Iodinated Diagnostic Agents Other (See Comments)    PAIN DURING LYMPHANGIOGRAM '86, NO ALLERGY TO IV CONTRAST  . Neosporin [Neomycin-Bacitracin Zn-Polymyx] Rash   Patient Active Problem List   Diagnosis Date Noted  . Iron deficiency anemia 04/11/2013  . Unstable angina 04/11/2013  . CAD (coronary artery disease) of artery bypass graft 04/03/2013  . Chronic diastolic CHF (congestive heart failure) 04/03/2013  . Anemia 04/03/2013  . Atrial fibrillation 12/22/2012  . Lipoma of neck 09/23/2012  . Palpitations 09/08/2012  . Pacemaker 09/08/2012  . Chronic kidney disease, stage 3/cardiorenal syndrome 08/15/2012  . Atrial flutter 07/23/2012  . Sick sinus syndrome 07/21/2012  . Hyperplastic gastric polyp 06/23/2012  . Gastroparesis due to DM 05/01/2012  . Hypercalcemia   . Cholelithiasis 07/20/2011  . Aortic stenosis status post 19 mm Edwards pericardial valve replacement 2011   . Arteriosclerotic cardiovascular disease (ASCVD)   . Osteopenia   . Anemia, normocytic normochromic   . NASH (nonalcoholic steatohepatitis)   . Renal cell carcinoma     Class: History of  . Duodenal ulcer     Class: History of  . Ischemic colitis     Class: History of  . Gastroesophageal reflux disease   . Hodgkin's disease   . Hypertension    . Small bowel obstruction   . Diastolic congestive heart failure with preserved left ventricular function, NYHA class 2   . Hyperlipidemia 04/11/2010  . Diabetes mellitus type 2, controlled 03/23/2010  . Colonic polyps, adenomatous 09/29/2009   History  Substance Use Topics  . Smoking status: Never Smoker   . Smokeless tobacco: Never Used  . Alcohol Use: No   family history includes Breast cancer in her sister and sister; Cancer in her sister; Diabetes in her father; Glaucoma in her brother; Heart attack in her father and mother; Heart disease in her father and mother; Hypertension in her brother; Lung cancer in her sister; Ovarian cancer in her sister. There is no history of Colon cancer or Stomach cancer.     Objective:   Physical Exam well-developed thin white female in no acute distress, pleasant blood pressure 126/50 pulse 88 height 5 foot 2 weight 124. HEENT; nontraumatic normocephalic EOMI PERRLA sclera anicteric, Supple; no JVD, Cardiovascular; regular rate and rhythm with S1-S2 she does have a soft systolic murmur, she has a pacemaker in the left chest wall and sternal incisional scars, Pulmonary; clear bilaterally, Abdomen ;soft nondistended, nontender bowel sounds are active she has an insulin pump, Rectal; exam not done she was recently documented Hemoccult positive by rectal exam by myself in the hospital, Extremities; no clubbing cyanosis or edema skin warm and dry, Psych; mood and affect normal and appropriate.        Assessment & Plan:  #142 62year old female with multiple comorbidities and severe coronary artery disease status post prior CABG and aortic valve replacement with very recent cardiac catheter with placement of drug-eluting stents x2 to the LAD-.patient still with intermittent anginal symptoms #2 aortic stenosis #3 sick sinus syndrome status post pacemaker #4 insulin-dependent diabetes mellitus with insulin pump #5 chronic kidney disease #6 progressive  anemia, iron deficient, heme positive-negative EGD during her recent hospitalization and negative colonoscopy December 2013. Will need to rule out small bowel source i.e. AVMs #7 hypertension #8 history of renal cell CA status  post right nephrectomy #9 remote history of Hodgkin's disease status post radiation #10 chronic anticoagulation- Brilinta and ASA  Plan; Fortunately her hemoglobin is on the rise, . Will check followup Cbc in one week. She definitely has increasing cardiac symptoms with progressive anemia Continue oral iron and vitamin C Schedule for capsule endoscopy which will need to be done with patient on telemetry given pacemaker. This will be scheduled as soon as possible She is anticipating both cardiac followup and appointment with hematology within the next week

## 2013-04-20 NOTE — Progress Notes (Addendum)
[name] [name], please make sure she has followup with me.

## 2013-04-20 NOTE — Telephone Encounter (Signed)
Waiting for a stat lab bmet and cbc went to lab asked Lanny Hurst our lab tech called [facility] and handed me the phone gave Pt's Mrn number and stated did not have those labs waiting to hear back from [place]

## 2013-04-20 NOTE — Patient Instructions (Addendum)
We did schedule the Capsule Endoscopy for 04-30-2013.  Arrive at the [facility] Registration.  You need to arrive at 7:30 am .  Instructions for this given today.

## 2013-04-20 NOTE — Progress Notes (Signed)
Pt in today to see Nicoletta Ba, PA. Pt given teaching for a Pill Capsule Endoscopy and she stated understanding. Prep instructions were covered with the pt and she stated understanding. She has an Insulin pump and will check with her Endocrinologist on the setting, etc. appt 04/30/13 at [place] d/t her pacer.

## 2013-04-20 NOTE — Patient Instructions (Addendum)
We need to check labs today  May need to arrange for another transfusion  We will get you to hematology ASAP  I think you need to get back to GI as well - needs appointment with Dr. [name] or with Amy ASAP  Increase the Imdur to a whole tablet a day

## 2013-04-20 NOTE — Telephone Encounter (Signed)
Error

## 2013-04-21 ENCOUNTER — Telehealth: Payer: Self-pay | Admitting: *Deleted

## 2013-04-21 ENCOUNTER — Telehealth (HOSPITAL_COMMUNITY): Payer: Self-pay | Admitting: *Deleted

## 2013-04-21 NOTE — Telephone Encounter (Signed)
I called the patient and let her know that the location of the [facility] Capsule Endoscopy has to be changed to [facility].She is to go to the main Entrance, and I let her know they have [place] parking.The patient was fine with the change of location. The date is still 04-30-2013, 8 AM and she should arrive at 7:30am.  They will see that she has access to a telemetry bed due to her having a pace maker.

## 2013-04-21 NOTE — Progress Notes (Signed)
Agree with Ms. Esterwood's assessment and plan. Carl E. Gessner, MD, FACG   

## 2013-04-22 ENCOUNTER — Telehealth: Payer: Self-pay | Admitting: Oncology

## 2013-04-23 ENCOUNTER — Ambulatory Visit (HOSPITAL_COMMUNITY): Payer: BC Managed Care – PPO

## 2013-04-24 ENCOUNTER — Other Ambulatory Visit: Payer: Self-pay | Admitting: Oncology

## 2013-04-24 DIAGNOSIS — D509 Iron deficiency anemia, unspecified: Secondary | ICD-10-CM

## 2013-04-24 NOTE — Progress Notes (Signed)
Agree with Ms. Esterwood's assessment and plan. Keshonda Monsour E. Mylia Pondexter, MD, FACG   

## 2013-04-26 ENCOUNTER — Other Ambulatory Visit: Payer: Self-pay | Admitting: Oncology

## 2013-04-27 ENCOUNTER — Other Ambulatory Visit (HOSPITAL_BASED_OUTPATIENT_CLINIC_OR_DEPARTMENT_OTHER): Payer: BC Managed Care – PPO | Admitting: Lab

## 2013-04-27 ENCOUNTER — Ambulatory Visit (HOSPITAL_COMMUNITY): Payer: BC Managed Care – PPO

## 2013-04-27 ENCOUNTER — Ambulatory Visit (HOSPITAL_BASED_OUTPATIENT_CLINIC_OR_DEPARTMENT_OTHER): Payer: BC Managed Care – PPO | Admitting: Oncology

## 2013-04-27 ENCOUNTER — Telehealth: Payer: Self-pay | Admitting: Internal Medicine

## 2013-04-27 ENCOUNTER — Encounter: Payer: Self-pay | Admitting: Oncology

## 2013-04-27 ENCOUNTER — Other Ambulatory Visit: Payer: Self-pay | Admitting: Oncology

## 2013-04-27 VITALS — BP 128/69 | HR 87 | Temp 98.0°F | Resp 18 | Ht 66.0 in | Wt 123.8 lb

## 2013-04-27 DIAGNOSIS — D649 Anemia, unspecified: Secondary | ICD-10-CM

## 2013-04-27 DIAGNOSIS — N289 Disorder of kidney and ureter, unspecified: Secondary | ICD-10-CM

## 2013-04-27 DIAGNOSIS — C649 Malignant neoplasm of unspecified kidney, except renal pelvis: Secondary | ICD-10-CM

## 2013-04-27 DIAGNOSIS — D509 Iron deficiency anemia, unspecified: Secondary | ICD-10-CM

## 2013-04-27 DIAGNOSIS — Z8571 Personal history of Hodgkin lymphoma: Secondary | ICD-10-CM

## 2013-04-27 DIAGNOSIS — C8119 Nodular sclerosis classical Hodgkin lymphoma, extranodal and solid organ sites: Secondary | ICD-10-CM

## 2013-04-27 DIAGNOSIS — Z8553 Personal history of malignant neoplasm of renal pelvis: Secondary | ICD-10-CM

## 2013-04-27 DIAGNOSIS — R195 Other fecal abnormalities: Secondary | ICD-10-CM

## 2013-04-27 LAB — CBC & DIFF AND RETIC
BASO%: 0.6 % (ref 0.0–2.0)
Basophils Absolute: 0.1 10*3/uL (ref 0.0–0.1)
EOS%: 3.3 % (ref 0.0–7.0)
HGB: 9.6 g/dL — ABNORMAL LOW (ref 11.6–15.9)
MCH: 25.9 pg (ref 25.1–34.0)
MCHC: 30.4 g/dL — ABNORMAL LOW (ref 31.5–36.0)
MCV: 85.4 fL (ref 79.5–101.0)
MONO%: 5 % (ref 0.0–14.0)
Platelets: 197 10*3/uL (ref 145–400)
RDW: 21.1 % — ABNORMAL HIGH (ref 11.2–14.5)
Retic Ct Abs: 137.64 10*3/uL — ABNORMAL HIGH (ref 33.70–90.70)

## 2013-04-27 LAB — FERRITIN CHCC: Ferritin: 35 ng/ml (ref 9–269)

## 2013-04-27 MED ORDER — FERROUS FUMARATE 325 (106 FE) MG PO TABS: ORAL_TABLET | ORAL | Status: DC

## 2013-04-27 NOTE — Progress Notes (Signed)
OFFICE PROGRESS NOTE   04/27/2013   Physicians:W.McGough, [name] (endocrine [facility]), [name], D.McLean, G.Lovena Le, B.Bartle  INTERVAL HISTORY:  Patient is seen, alone for visit, at request of cardiology, because of recent complications from her multiple medical problems, including blood loss anemia. She has been followed at this office since 1980s, intially for Hodgkins and later renal cell carcinoma, neither of which is known active, and was seen last at this office on 01-21-2013 with Hgb 11, MCV 82 then, previously Hgb 11.6 in Jan 2014. Comorbid conditions include ACD with prior CABG then redo CABG, AS post aortic valve 2011, SSS with pacer, insulin dependent diabetes now followed by Dr [name] at [facility], hypothyroidism related to RT for Hodgkins, diagnosis of ischemic colitis with rectal bleeding during past year, and some renal insufficiency with single kidney. Patient was admitted to [facility] from 04-03-13 thru 04-11-13 with progressive dyspnea, orthopnea, unstable angina and Hgb of 7.3 on day of admission, this following outpatient evaluation with cardiopulmonary exercise test with evidence of ischemia. She ruled out for MI. Stool was heme + by GI in hospital; she was transfused 1 unit of PRBCs on 04-03-13 and begun on ferrous sulfate bid. EGD 04-06-13 was unremarkable including duodenal biopsy. Endocrine consultedShe had cardiac cath on 04-07-13 with high grade LM and proximal LAD involvement, and diffuse calcific plaque in aorta. CVTS (Dr Cyndia Bent) felt that she was not a candidate for a 3rd re-do surgical procedure particularly due to diffuse aortic calcification. She was diureses After careful consideration, she was taken to PTCA of left main with stent into ostium of LM into LAD by Dr Angelena Form on 04-09-13; per cardiology she will need lifelong dual antiplatelet therapy with ASA and plavix.  Patient was seen back by cardiology and GI on 04-20-13, with follow up at this office requested. She is for  capsule endoscopy on 04-30-13 at [facility]; colonoscopy in Dec 2013 was unremarkable such that this is not planned again now. Patient has not seen any obvious GI or other bleeding. She may not be tolerating the ferrous sulfate well and we will try changing this to ferrous fumarate. She is less short of breath with exertion and is not having the chest tightness since recent stent.   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 She has radiation induced hypothyroidism and the treatment caused or worsened coronary artery disease. 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 She is no longer followed regularly by urology.   Review of systems as above, also: is being evaluated by Dr Frederik Pear for insulin allergy. No urinary symptoms. No LE swelling. No new or different pain. Shoe inserts by Dr Blenda Mounts helpful with neuropathy in feet. Remainder of 10 point Review of Systems negative.   She has been on medical leave from [employer] [employer] [employer] and will be officially retiring as of end of October, not yet clear what her insurance will be at that point.  Objective:  Vital signs in last 24 hours:  BP 128/69  Pulse 87  Temp(Src) 98 F (36.7 C) (Oral)  Resp 18  Ht 5' 6"  (1.676 m)  Wt 123 lb 12.8 oz (56.155 kg)  BMI 19.99 kg/m2 weight is down 1 lb from 01-21-13.  Alert, oriented and appropriate, excellent historian as always. Ambulatory without difficulty. Respirations not labored with activity in exam room.  HEENT:PERRL, sclerae not icteric. Oral mucosa moist without lesions, posterior pharynx clear.  Neck supple. No JVD.  Lymphatics:no cervical,suraclavicular,  axillary or inguinal adenopathy Resp: clear to auscultation bilaterally and normal percussion bilaterally Cardio: regular rate and rhythm. No gallop. Pacer in. Median sternotomy scarring. GI: soft, nontender, not distended, no mass or organomegaly. Normally active bowel sounds.  Musculoskeletal/ Extremities: without pitting edema, cords, tenderness. Back nontender Neuro/ Psych: speech fluent, CN intact, moves all extremities equally, cerebellar nonfocal, appropriate affect. Skin without rash, ecchymosis, petechiae. Insulin pump in place. Breasts: tissue feels dense bilaterally, without dominant mass, skin or nipple findings. Axillae benign. No central catheter  Lab Results:  Results for orders placed in visit on 04/27/13  CBC & DIFF AND RETIC      Result Value Range   WBC 7.8  3.9 - 10.3 10e3/uL   NEUT# 6.2  1.5 - 6.5 10e3/uL   HGB 9.6 (*) 11.6 - 15.9 g/dL   HCT 31.6 (*) 34.8 - 46.6 %   Platelets 197  145 - 400 10e3/uL   MCV 85.4  79.5 - 101.0 fL   MCH 25.9  25.1 - 34.0 pg   MCHC 30.4 (*) 31.5 - 36.0 g/dL   RBC 3.70  3.70 - 5.45 10e6/uL   RDW 21.1 (*) 11.2 - 14.5 %   lymph# 0.9  0.9 - 3.3 10e3/uL   MONO# 0.4  0.1 - 0.9 10e3/uL   Eosinophils Absolute 0.3  0.0 - 0.5 10e3/uL   Basophils Absolute 0.1  0.0 - 0.1 10e3/uL   NEUT% 79.5 (*) 38.4 - 76.8 %   LYMPH% 11.6 (*) 14.0 - 49.7 %   MONO% 5.0  0.0 - 14.0 %   EOS% 3.3  0.0 - 7.0 %   BASO% 0.6  0.0 - 2.0 %   Retic % 3.72 (*) 0.70 - 2.10 %   Retic Ct Abs 137.64 (*) 33.70 - 90.70 10e3/uL   Immature Retic Fract 14.60 (*) 1.60 - 10.00 %    Will check UA with follow up visit here in ~ 3 weeks with history of the renal cell ca (none in this EMR in 2014)  Ferritin available after visit up to 35, this having been 10 in Sept.  Studies/Results: Mammograms 3D/tomo done at [facility] 03-26-13, with careful attention from Dr Isaiah Blakes during that imaging, no mammographic findings of concern for malignancy.  Last CXR 02-13-13. Last CT Abd  07-2011   Medications: I have reviewed the patient's current medications. Will change ferrous sulfate to ferrous fumarate (as Hemocyte if insurance allows) which she will try to take on empty stomach with Vitamin C bid.  She had flu vaccine 04-04-2013  DISCUSSION: we have discussed recent complicated course as above. Anemia is likely multifactorial, tho heme + stools likely are the cause of significant recent change. She is clinically stable with present 9.6 and increased reticulocyte response, and prefers to follow this on ferrous fumarate for now. Anemia likely also in part related to CKD and chronic disease. Will check urine with next labs in case microscopic hematuria. Note multiple blood draws regularly with multiple MDs.  Assessment/Plan: 1.symptomatic anemia with heme positive stools during recent hospitalization  Change oral iron to ferrous fumarate. Pill endoscopy by GI upcoming. Return visit with follow up labs in ~ 3 weeks. 2.IIA nodular sclerosing Hodgkins treated with mantle radiation in 1980s. 3.post right nephrectomy for renal  cell carcinoma 2008, not known recurrent.UA on return visit 4.Cardiac disease: CAD post RCA vein graft and redo CABG 2011, aortic stenosis post aortic valve 2011, sinus node dysfunction with brief episodes PAF, dual chamber pacer Jan 2014. Now post stent to LM into proximal LAD for progressive involvement there, not candidate for another surgery per CVTS. 5. Insulin dependent diabetes: now followed by Dr [name] at [facility], with consideration of insulin allergy  6.hypothyroidism post RT 7.rectal bleeding ~ 12-2012 thought from ischemic colitis. Post colonoscopy 06-2012 with no polyps. 8.renal insufficiency: single kidney, diabetes.   Patient had questions answered to her satisfaction and is in agreement with plan.  LIVESAY,LENNIS P, MD   04/27/2013, 2:47 PM

## 2013-04-27 NOTE — Telephone Encounter (Signed)
All questions answered.  She will call back for additional questions or concerns.  

## 2013-04-27 NOTE — Patient Instructions (Signed)
Call if needed before next scheduled visit    [phone]   Hemocyte or ferrous fumarate   ~ 325 mg twice daily on empty stomach with Vit C, ~ an hour before meals or 2 hrs after as you are able. If you have to eat closer to the doses, we certainly understand

## 2013-04-27 NOTE — Telephone Encounter (Signed)
Left message for patient to call back  

## 2013-04-29 ENCOUNTER — Encounter: Payer: Self-pay | Admitting: Nurse Practitioner

## 2013-04-29 ENCOUNTER — Ambulatory Visit (INDEPENDENT_AMBULATORY_CARE_PROVIDER_SITE_OTHER): Payer: BC Managed Care – PPO | Admitting: Nurse Practitioner

## 2013-04-29 ENCOUNTER — Ambulatory Visit (HOSPITAL_COMMUNITY): Payer: BC Managed Care – PPO

## 2013-04-29 VITALS — BP 128/48 | HR 88 | Ht 62.5 in | Wt 122.4 lb

## 2013-04-29 DIAGNOSIS — R0602 Shortness of breath: Secondary | ICD-10-CM

## 2013-04-29 DIAGNOSIS — R079 Chest pain, unspecified: Secondary | ICD-10-CM

## 2013-04-29 NOTE — Progress Notes (Addendum)
[name] [name] Date of Birth: Jun 26, 1951 Medical Record #[id number]  History of Present Illness: Ms. Leon is seen back today for a 2 week check. Seen for Dr. Aundra Dubin. Has CAD with past CABG with SVG to the RCA with redo vein grafting of the RCA with her AVR in 2011, iron deficiency anemia, trype 2 DM, CKD, HLD, HTN, aortic stenosis with prior AVR in 2011, GERD, SSS with PPM in place, chronic diastolic HF with EF of 60 to 65%. 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   Has had an abnormal CPX from May showing severe functional limitation with ischemic ECG changes. Seen here on September 12 and CBC showed HGB down to 7.3. Admitted for further evaluation.   Ruled out for an MI. Stool was heme positive. EGD was essentially normal. PPI therapy and iron was continued. Needs hematology referral. Underwent cardiac cath and had staged PCI within the left main and ostial LAD after surgical consult with [facility] - felt to be too high risk for a 3rd redo surgery. Was diuresed as well.   I saw her after being home about a week. She was still not feeling very well - still with some chest tightness and shortness of breath. Stools were black. On iron. Was short of breath with lying down Saturday night and used NTG spray with good results. Imdur has caused headaches in the past but was still able to take a very low dose. Blood sugars were all over the place. She remains on her Brilinta. Some bruising noted. We were able to get her back to see Dr. Marko Plume for her anemia - seen earlier this week and remains on iron. Seen back by GI as well and will be having capsule endoscopy later this week - with telemetry monitoring due to her pacemaker in place.   Comes in today. Here alone. Doing "about the same". Uses NTG x 1 sl with good response each week. Happens after walking. Gets short of breath. Cardiac symptoms still better than where she was from  prior to the stent.  For her capsule endo tomorrow. Little upset stomach today. No swelling. Weight is down 2 pounds. Not sure when she needs to get her device checked. Also worried about possible drug interaction with Brilinta and Biaxin - uses Biaxin for her SBE - has multiple drug allergies. Also has had a case worker come out - wondering why she is not on ACE - has a normal EF with known CKD and one functioning kidney - I told her I would be hesitant to start that. Basically tolerating the Imdur with just occasional headache.   Current Outpatient Prescriptions  Medication Sig Dispense Refill  . ALPRAZolam (XANAX) 0.5 MG tablet Take 0.25 mg by mouth at bedtime as needed for sleep.      Marland Kitchen aspirin EC 81 MG tablet Take 81 mg by mouth daily.      . Calcium Carbonate-Vitamin D (RA CALCIUM PLUS VITAMIN D) 600-400 MG-UNIT per tablet Take 1 tablet by mouth 2 (two) times daily.       . cholecalciferol (VITAMIN D) 1000 UNITS tablet Take 1,000 Units by mouth daily.        Marland Kitchen desoximetasone (TOPICORT) 0.05 % cream Apply 1 application topically 2 (two) times daily as needed. When skin starts peeling      . docusate sodium (COLACE) 100 MG capsule Take 1 capsule (100 mg total) by mouth  2 (two) times daily.  60 capsule  3  . ezetimibe (ZETIA) 10 MG tablet Take 10 mg by mouth at bedtime.      . fenofibrate (TRICOR) 145 MG tablet Take 145 mg by mouth at bedtime.      . ferrous fumarate (HEMOCYTE - 106 MG FE) 325 (106 FE) MG TABS tablet Take 1 tablet twice a day on empty stomach with Vit. C  60 each  3  . fluticasone (FLONASE) 50 MCG/ACT nasal spray Place 2 sprays into the nose daily as needed for rhinitis. For nasal congestion      . furosemide (LASIX) 40 MG tablet Take 1.5 tablets (60 mg total) by mouth daily.  90 tablet  11  . GLUCAGON EMERGENCY 1 MG injection       . Insulin Human (INSULIN PUMP) 100 unit/ml SOLN Inject into the skin continuous. Apidra Basal Rate:  Midnight-1100=2.5 1100-1300=4.2 1300-1700=4.3 1700-midnight=4.5      . isosorbide mononitrate (IMDUR) 30 MG 24 hr tablet Take 1 tablet (30 mg total) by mouth at bedtime.      . Magnesium 250 MG TABS Take 1 tablet by mouth daily.      . metoprolol (LOPRESSOR) 50 MG tablet Take 25-50 mg by mouth 2 (two) times daily. Takes 51m in the am & 562min the pm      . Multiple Vitamins-Minerals (MULTIVITAMIN WITH MINERALS) tablet Take 1 tablet by mouth daily.        . Marland KitchenITROLINGUAL 0.4 MG/SPRAY spray USE ONE SPRAY UNDER TONGUE EVERY 5 MINUTES AS NEEDED FOR CHEST PAIN. MAY REPEAT THREE TIMES  4.9 g  3  . omeprazole (PRILOSEC OTC) 20 MG tablet Take 20 mg by mouth daily.      . polyethylene glycol (MIRALAX / GLYCOLAX) packet Take 8.5 g by mouth daily as needed. For constipation      . potassium chloride SA (K-DUR,KLOR-CON) 20 MEQ tablet Take 40 mEq by mouth daily.      . pravastatin (PRAVACHOL) 10 MG tablet Take 10 mg by mouth at bedtime.      . Ticagrelor (BRILINTA) 90 MG TABS tablet Take 1 tablet (90 mg total) by mouth 2 (two) times daily.  60 tablet  6  . vitamin C (ASCORBIC ACID) 500 MG tablet Take 500 mg by mouth daily.       No current facility-administered medications for this visit.    Allergies  Allergen Reactions  . Clindamycin/Lincomycin Anaphylaxis and Shortness Of Breath  . Penicillins Anaphylaxis, Hives and Rash    Childhood allergy   . Gentamycin [Gentamicin] Hives, Itching and Rash    Reaction noted post loop recorder implant, where gentamycin was used for irrigation. Topical prep  . Tizanidine Other (See Comments)    Hallucinate  . Atorvastatin Other (See Comments)     increased LFT's  . Avandia [Rosiglitazone Maleate]     Congestive heart failure  . Codeine Nausea And Vomiting  . Crestor [Rosuvastatin] Other (See Comments)    Muscle and joint pain &  . Erythromycin Nausea And Vomiting  . Metformin     Congestive heart failure  . Novolog [Insulin Aspart] Itching    Patient  reports she has severe itching with Novolog insulin  . Plavix [Clopidogrel Bisulfate]     P2Y12 testing = 271 while on Plavix  . Plavix [Clopidogrel]     Does not work for patient  . Pravastatin Other (See Comments)    In high doses (20-4074mcauses muscle and joint pain   .  Simvastatin     Increased LFT's  . Cephalexin Swelling and Rash    Extremities swell   . Iodinated Diagnostic Agents Other (See Comments)    PAIN DURING LYMPHANGIOGRAM '86, NO ALLERGY TO IV CONTRAST  . Neosporin [Neomycin-Bacitracin Zn-Polymyx] Rash    Past Medical History  Diagnosis Date  . Aortic stenosis     a. bicuspid aortic valve; mean gradient of 20 mmHg in 2/10; b. s/p bioprosth AVR (19-mm Edwards pericardial valve) with a redo coronary artery bypass graft procedure in 07/2009; postoperative Dressler's syndrome    . Coronary artery disease     a. s/p RCA saphenous vein graft in 06/1999; b. multiple preceding PCIs with restenosis; c. total obstruction of RCA in 2006 with patent graft; d. 07/1999 Redo VG->RCA;  e. 03/2013 Cath/PCI: LM 80ost/d (2.75x24 Promus Premier DES), LAD 80ost, LCx nl, RCA 100, VG->PDA ok.  . Chronic diastolic CHF (congestive heart failure)     a. 9.2014 EchoP EF 60-65%, no rwma, bioprosth AVR, mean gradient of 73mHg, mild to mod MR, PASP 572mg.  . Marland Kitchenyperlipidemia   . Hypertension   . Small bowel obstruction     Recurrent; resolved after resection of a lipoma  . Osteopenia      hip on DEXA in October 2007.  . Marland Kitchennemia   . Carcinoma, renal cell 05/2007    Laparoscopic right nephrectomy  . NASH (nonalcoholic steatohepatitis) 1999    -biopsy in 1999  . Diabetes mellitus 03/2010     TYPE II: Hemoglobin A1c of 7.4 in  03/2010; 8.4 in 06/2010; treated with insulin pump  . Migraines   . Duodenal ulcer     Remote; H. pylori positive  . Colitis, ischemic 2008  . Gastroesophageal reflux disease     ? of GASTROPARESIS; IRRITABLE BOWEL SYNDROME  . Hodgkin's disease 1991    Mantle radiation  therapy  . Exertional dyspnea     chest discomfort  . Statin intolerance   . Hypercalcemia     Ca-11.1 in 07/2011  . Vitamin D deficiency   . Costochondritis   . Insomnia   . Iron deficiency anemia     a. 9.2014 EGD: essentially normal.  . Gastroparesis due to DM 05/01/2012  . PONV (postoperative nausea and vomiting)   . Hyperplastic gastric polyp 06/23/2012  . Arthritis   . Osteoporosis   . Pacemaker 08/21/2012    Medtronic Adapta L dual-chamber pacemaker-Dr. TaLovena Le. Anxiety     Past Surgical History  Procedure Laterality Date  . Total abdominal hysterectomy w/ bilateral salpingoophorectomy  2000  . Breast excisional biopsy      benign, bilateral  . Nephrectomy  2008    Laparoscopic right nephrectomy, renal cell carcinoma  . Coronary artery bypass graft  2005, 2011    CABG-most recent SVG to RCA-12/05; RCA occluded with patent graft in 2006; Redo bypass in 07/2009  . Tumor excision  1981    removal of Hodgkins lymphoma  . Exploratory laparotomy w/ bowel resection      Small bowel resection of lipoma  . Aortic valve replacement  2011    Aortic valve replacement surgery with a 19 mm bioprosthetic device post redo CABG January 2011.  . Colonoscopy      Multiple with adenomatous polyps  . Cervical biopsy    . Liver biopsy    . Bone marrow biopsy    . Esophagogastroduodenoscopy    . Esophagogastroduodenoscopy  06/23/2012    Procedure: ESOPHAGOGASTRODUODENOSCOPY (EGD);  Surgeon: CaOfilia Neas  [name], MD;  Location: [facility];  Service: Endoscopy;  Laterality: N/A;  . Colonoscopy  06/23/2012    Procedure: COLONOSCOPY;  Surgeon: Gatha Mayer, MD;  Location: [facility];  Service: Endoscopy;  Laterality: N/A;  [name] dilation  06/23/2012    Procedure: [name] DILATION;  Surgeon: Gatha Mayer, MD;  Location: [facility];  Service: Endoscopy;;  56 fr  . Pacemaker insertion  08/21/2012    Medtronic Adapta L dual-chamber pacemaker  . Esophagogastroduodenoscopy N/A 04/06/2013     Procedure: ESOPHAGOGASTRODUODENOSCOPY (EGD);  Surgeon: Irene Shipper, MD;  Location: [facility];  Service: Endoscopy;  Laterality: N/A;    History  Smoking status  . Never Smoker   Smokeless tobacco  . Never Used    History  Alcohol Use No    Family History  Problem Relation Age of Onset  . Heart attack Mother   . Heart disease Mother   . Heart attack Father   . Diabetes Father   . Heart disease Father   . Hypertension Brother   . Glaucoma Brother   . Lung cancer Sister     meta  . Breast cancer Sister     half sister (Paternal)  . Ovarian cancer Sister   . Cancer Sister     cervical and lung - half sister (maternal)  . Breast cancer Sister   . Colon cancer Neg Hx   . Stomach cancer Neg Hx     Review of Systems: The review of systems is per the HPI.  All other systems were reviewed and are negative.  Physical Exam: BP 128/48  Pulse 88  Ht 5' 2.5" (1.588 m)  Wt 122 lb 6.4 oz (55.52 kg)  BMI 22.02 kg/m2 Patient is very pleasant and in no acute distress. Skin is warm and dry. Color is normal.  HEENT is unremarkable. Normocephalic/atraumatic. PERRL. Sclera are nonicteric. Neck is supple. No masses. No JVD. Lungs are clear. Cardiac exam shows a regular rate and rhythm. Outflow murmur noted. Abdomen is soft. Extremities are without edema. Gait and ROM are intact. No gross neurologic deficits noted.  LABORATORY DATA:  Lab Results  Component Value Date   WBC 7.8 04/27/2013   HGB 9.6* 04/27/2013   HCT 31.6* 04/27/2013   PLT 197 04/27/2013   GLUCOSE 125* 04/20/2013   CHOL 151 04/04/2013   TRIG 116 04/04/2013   HDL 29* 04/04/2013   LDLCALC 99 04/04/2013   ALT 18 04/03/2013   AST 39* 04/03/2013   NA 143 04/20/2013   K 4.0 04/20/2013   CL 109 04/20/2013   CREATININE 1.40* 04/20/2013   BUN 35* 04/20/2013   CO2 27 04/20/2013   TSH 2.385 04/04/2013   INR 1.11 04/09/2013   HGBA1C 6.3* 04/04/2013   Coronary angiography:  Coronary dominance: right  Left mainstem: Small caliber. The  catheter damped with engagement of the left main and patient developed mild chest pain with contrast injections. 70-80% distal left main with 80% ostial LAD. The distal left main looked hazy in some views. IC nitroglycerine was used.  Left anterior descending (LAD): Ostial 80% stenosis. Diffuse mild luminal irregularities in the rest of the LAD.  Left circumflex (LCx): No significant disease in the LCx system.  Right coronary artery (RCA): Totally occluded native RCA at ostium. Patent SVG-PDA backfills to distal RCA and PLV.  Left ventriculography: Not done, CKD.  Final Conclusions: Patent SVG-PDA with 70-80% distal LM disease extending into the ostial LAD. The catheter waveform ventricularized with engagement of  the left main and the patient had chest pain with injections. 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 EF normal from prior echo in 4/14.  Difficult situation. Patient has had 2 prior sternotomies as well as chest radiation for lymphoma. She has iron deficiency anemia and has had transfusion this admission. She has CKD. A third sternotomy would be high risk and she has ascending aorta calcification. Conversely, PCI to address left main and ostial LAD would be high risk (unprotected left main) especially given concern for iron deficiency anemia and presumed slow GI blood loss (EGD unremarkable this admission). I will have CVTS evaluate (Dr. Cyndia Bent operated on her in the past). I will discuss the situation with him and will need to decide on revascularization approach.  Given CKD, will very gently hydrate (elevated filling pressures). If stable tomorrow, begin diuresis.  Needs echo to reassess LV systolic function, aortic valve, and mitral valve (prominent v-waves). Will also look for any evidence of pericardial constriction by echo.  [name]  04/07/2013  3:32 PM   PCI Procedure  Details:  The risks, benefits, complications, treatment options, and expected outcomes were discussed with the patient. The patient and/or family concurred with the proposed plan, giving informed consent. The patient was brought to the cath lab after IV hydration was begun and oral premedication was given. The patient was further sedated with Versed and Fentanyl. The right groin was prepped and draped in the usual manner. Using the modified Seldinger access technique, a 6 French sheath was placed in the right femoral artery. She was given a weight based bolus of Angiomax and a drip was started. She had been given 300 mg Plavix po x 1 yesterday. I engaged the left main with a XB LAD 3.5 guiding catheter with sideholes. When the ACT was greater than 200, I passed a BMW wire down the LAD. A 2.0 x 15 mm balloon was used to pre-dilate the distal left main into the LAD x 1. I then carefully positioned and deployed a 2.75 x 24 mm Promus Premier DES in the ostium of the left main extending down into the proximal LAD. I then passed an IVUS catheter into the LAD and performed automated pullback visualizing the stented segment. This was used to size the post-dilatation balloon. A 3.0 x 15 mm [place] balloon was inflated to 12 atm in the distal stented segment and 18 atm in the proximal stented segment. There was an excellent angiographic result. The stenosis was taken from 90% down to 0%. There was excellent flow down the Circumflex artery.  There were no immediate complications. The patient was taken to the recovery area in stable condition.  Hemodynamic Findings:  Central aortic pressure: 117/59  Impression:  1. Unstable angina  2. Successful PTCA/DES x 1 left main coronary artery into the LAD  Recommendations: She will need lifelong dual antiplatelet therapy with ASA and Plavix. Continue aggressive risk factor modification. Continue other cardiac meds.  Complications: None; patient tolerated the procedure well.     Echo Study Conclusions  - Left ventricle: The cavity size was normal. Wall thickness was normal. Systolic function was normal. The estimated ejection fraction was in the range of 60% to 65%. Indeterminant diastolic function. Although no diagnostic regional wall motion abnormality was identified, this possibility cannot be completely excluded on the basis of this study. - Aortic valve: There was a bioprosthetic aortic valve. No significant prosthetic valve regurgitation or  stenosis. Mean gradient: 62m Hg (S). - Mitral valve: Severely calcified annulus. Mild to moderate regurgitation. - Left atrium: The atrium was mildly dilated. - Right ventricle: The cavity size was normal. Systolic function was normal. - Tricuspid valve: Peak RV-RA gradient:4107mHg (S). - Pulmonary arteries: PA peak pressure: 5174mg (S). - Inferior vena cava: The vessel was normal in size; the respirophasic diameter changes were in the normal range (= 50%); findings are consistent with normal central venous pressure.    Assessment / Plan: 1. Unstable angina - known CAD - s/p PCI with DES to the left main - symptoms have improved but certainly not resolved. She is on Brilinta and will be committed to DAPT for lifelong use. Using NTG at least once a week with good response. Tolerating low dose Imdur - would continue to manage medically and address her anemia before further medication changes are made.   2. Anemia - iron deficient - has seen GI and hematology. Has plans for capsule endoscopy per GI. Further disposition to follow.   3. CKD   4. Diastolic HF - normal EF - seems fairly compensated to me. Her EF is normal. With her CKD and only one functioning kidney - would not favor ACE at this time - Dr. McLAundra Dubin in agreement.   5. Possible drug interraction with her Brilinta for SBE - will get the pharmacist to check ? Vancomycin  6. PPM in place - need to find out about next device check  I have left  her on her current regimen. Sees Dr. McLAundra Dubinck next month as planned.   Patient is agreeable to this plan and will call if any problems develop in the interim.   LorBurtis JunesN, ANPVanderbilt12[address]17 Lake Forest StreetiNew HopereStratfordC [id number]  Addendum from the Pharmacist here - [place]P            I see allergy to penicillin, keflex, and clindamycin (anaphylaxis). I don't see any allergy for azithromycin. Azithromycin 500 mg x 1 one hour prior to dental can be used for SBE. Please let me know if this wouldn't work for some reason. An alternative to consider as it covers the organisms we're concerned with would be doxycycline. Since there is no indication, the ultimate dose is unknown. I would suggest 200 mg x 1 (one hour prior) doxy. I would not use vanco. Bactrim could be considered as well, but given her history, sulfa would be a long shot. My suggestions would be: 1. Azith 500 mg x 1., or 2. Doxy 200 mg x 1, or 3. Bactrim worst case (no indication for bactrim or doxy).

## 2013-04-29 NOTE — Patient Instructions (Addendum)
Stay on your current medicines for now  See Dr. Aundra Dubin next month as planned  Call the [place] office at [phone] if you have any questions, problems or concerns.

## 2013-04-30 ENCOUNTER — Encounter (HOSPITAL_COMMUNITY)
Admission: RE | Disposition: A | Discharge status: Home / Self Care | Payer: Self-pay | Source: Ambulatory Visit | Attending: Internal Medicine

## 2013-04-30 ENCOUNTER — Observation Stay (HOSPITAL_COMMUNITY)
Admission: RE | Admit: 2013-04-30 | Discharge: 2013-04-30 | Disposition: A | Discharge status: Home / Self Care | Payer: BC Managed Care – PPO | Source: Ambulatory Visit | Attending: Internal Medicine | Admitting: Internal Medicine

## 2013-04-30 DIAGNOSIS — Z8571 Personal history of Hodgkin lymphoma: Secondary | ICD-10-CM | POA: Insufficient documentation

## 2013-04-30 DIAGNOSIS — E119 Type 2 diabetes mellitus without complications: Secondary | ICD-10-CM

## 2013-04-30 DIAGNOSIS — N189 Chronic kidney disease, unspecified: Secondary | ICD-10-CM | POA: Insufficient documentation

## 2013-04-30 DIAGNOSIS — I129 Hypertensive chronic kidney disease with stage 1 through stage 4 chronic kidney disease, or unspecified chronic kidney disease: Secondary | ICD-10-CM | POA: Insufficient documentation

## 2013-04-30 DIAGNOSIS — Z85528 Personal history of other malignant neoplasm of kidney: Secondary | ICD-10-CM | POA: Insufficient documentation

## 2013-04-30 DIAGNOSIS — Z7901 Long term (current) use of anticoagulants: Secondary | ICD-10-CM | POA: Insufficient documentation

## 2013-04-30 DIAGNOSIS — Z794 Long term (current) use of insulin: Secondary | ICD-10-CM | POA: Insufficient documentation

## 2013-04-30 DIAGNOSIS — Z9641 Presence of insulin pump (external) (internal): Secondary | ICD-10-CM | POA: Insufficient documentation

## 2013-04-30 DIAGNOSIS — D649 Anemia, unspecified: Secondary | ICD-10-CM

## 2013-04-30 DIAGNOSIS — I2510 Atherosclerotic heart disease of native coronary artery without angina pectoris: Secondary | ICD-10-CM | POA: Insufficient documentation

## 2013-04-30 DIAGNOSIS — D509 Iron deficiency anemia, unspecified: Principal | ICD-10-CM

## 2013-04-30 DIAGNOSIS — I5032 Chronic diastolic (congestive) heart failure: Secondary | ICD-10-CM | POA: Insufficient documentation

## 2013-04-30 DIAGNOSIS — Z951 Presence of aortocoronary bypass graft: Secondary | ICD-10-CM | POA: Insufficient documentation

## 2013-04-30 DIAGNOSIS — Z952 Presence of prosthetic heart valve: Secondary | ICD-10-CM | POA: Insufficient documentation

## 2013-04-30 DIAGNOSIS — Z79899 Other long term (current) drug therapy: Secondary | ICD-10-CM | POA: Insufficient documentation

## 2013-04-30 DIAGNOSIS — Z950 Presence of cardiac pacemaker: Secondary | ICD-10-CM | POA: Insufficient documentation

## 2013-04-30 DIAGNOSIS — I509 Heart failure, unspecified: Secondary | ICD-10-CM | POA: Insufficient documentation

## 2013-04-30 HISTORY — PX: GIVENS CAPSULE STUDY: SHX5432

## 2013-04-30 LAB — GLUCOSE, CAPILLARY
Glucose-Capillary: 93 mg/dL (ref 70–99)
Glucose-Capillary: 145 mg/dL — ABNORMAL HIGH (ref 70–99)
Glucose-Capillary: 114 mg/dL — ABNORMAL HIGH (ref 70–99)

## 2013-04-30 SURGERY — IMAGING PROCEDURE, GI TRACT, INTRALUMINAL, VIA CAPSULE
Anesthesia: LOCAL

## 2013-04-30 MED ORDER — DEXTROSE-NACL 5-0.45 % IV SOLN
INTRAVENOUS | Status: AC
Start: 2013-04-30 — End: 2013-04-30
  Administered 2013-04-30: 10:00:00 via INTRAVENOUS

## 2013-04-30 MED ORDER — METOPROLOL TARTRATE 25 MG PO TABS
25.0000 mg | ORAL_TABLET | Freq: Once | ORAL | Status: AC
  Administered 2013-04-30: 25 mg via ORAL
  Filled 2013-04-30: qty 1

## 2013-04-30 MED ORDER — SODIUM CHLORIDE 0.9 % IJ SOLN: 3.0000 mL | Freq: Two times a day (BID) | INTRAMUSCULAR | Status: DC

## 2013-04-30 MED ORDER — SODIUM CHLORIDE 0.9 % IV SOLN: 250.0000 mL | INTRAVENOUS | Status: DC | PRN

## 2013-04-30 MED ORDER — ASPIRIN EC 81 MG PO TBEC
81.0000 mg | DELAYED_RELEASE_TABLET | Freq: Every day | ORAL | Status: DC
  Administered 2013-04-30: 81 mg via ORAL
  Filled 2013-04-30: qty 1

## 2013-04-30 MED ORDER — OMEPRAZOLE MAGNESIUM 20 MG PO TBEC: 20.0000 mg | DELAYED_RELEASE_TABLET | Freq: Every day | ORAL | Status: DC

## 2013-04-30 MED ORDER — POTASSIUM CHLORIDE CRYS ER 20 MEQ PO TBCR
40.0000 meq | EXTENDED_RELEASE_TABLET | Freq: Every day | ORAL | Status: DC
  Administered 2013-04-30: 40 meq via ORAL
  Filled 2013-04-30: qty 2

## 2013-04-30 MED ORDER — GLUCAGON (RDNA) 1 MG IJ KIT: 1.0000 mg | PACK | Freq: Once | INTRAMUSCULAR | Status: DC | PRN

## 2013-04-30 MED ORDER — TICAGRELOR 90 MG PO TABS
90.0000 mg | ORAL_TABLET | Freq: Two times a day (BID) | ORAL | Status: DC
  Administered 2013-04-30 (×2): 90 mg via ORAL
  Filled 2013-04-30 (×2): qty 1

## 2013-04-30 MED ORDER — GLUCAGON HCL (RDNA) 1 MG IJ SOLR: 1.0000 mg | Freq: Once | INTRAMUSCULAR | Status: DC | PRN

## 2013-04-30 MED ORDER — FUROSEMIDE 40 MG PO TABS
60.0000 mg | ORAL_TABLET | Freq: Every day | ORAL | Status: DC
  Administered 2013-04-30: 60 mg via ORAL
  Filled 2013-04-30: qty 1

## 2013-04-30 MED ORDER — INSULIN PUMP
Freq: Three times a day (TID) | SUBCUTANEOUS | Status: DC
  Filled 2013-04-30: qty 1

## 2013-04-30 MED ORDER — ASPIRIN EC 81 MG PO TBEC
81.0000 mg | DELAYED_RELEASE_TABLET | Freq: Every day | ORAL | Status: DC
  Filled 2013-04-30: qty 1

## 2013-04-30 MED ORDER — SODIUM CHLORIDE 0.9 % IJ SOLN
3.0000 mL | Freq: Two times a day (BID) | INTRAMUSCULAR | Status: DC
  Administered 2013-04-30: 12:00:00 via INTRAVENOUS

## 2013-04-30 MED ORDER — SODIUM CHLORIDE 0.9 % IJ SOLN: 3.0000 mL | INTRAMUSCULAR | Status: DC | PRN

## 2013-04-30 MED ORDER — PANTOPRAZOLE SODIUM 20 MG PO TBEC
20.0000 mg | DELAYED_RELEASE_TABLET | Freq: Every day | ORAL | Status: DC
  Administered 2013-04-30: 20 mg via ORAL
  Filled 2013-04-30 (×3): qty 1

## 2013-04-30 SURGICAL SUPPLY — 1 items: TOWEL COTTON PACK 4EA (MISCELLANEOUS) ×4 IMPLANT

## 2013-04-30 NOTE — Progress Notes (Signed)
Spoke with patient about her diabetes and insulin pump.  Sees Dr. [name] at [facility] as her endocrinologist.  Dr. Frederik Pear had decided that patient be on temporary basal rates at 50% due to being NPO with endo procedure.  Patient will be staying at least 12 hours after the beginning of the procedure for observation. Patient contract signed by patient and flow sheet at bedside.   Normal basal rates on pump: 12 MN to 4 AM: 0.3 units/hr. 4 AM to 7 AM: 0.25 units/hr. 7 AM to 7 PM: 2.50 units/hr. 7 PM to 12 MN: 1.0 units/hr.  CHO ratio: 12 AM  1 unit/5 CHO          7 AM  1 unit/4 CHO          8 PM   1 unit/10 CHO  Sensitivity: 1:25  Will continue to follow while in hospital.   Patient very capable of tending to own pump and understands that hospital will check CBGs.       Harvel Ricks RN BSN CDE

## 2013-04-30 NOTE — H&P (Signed)
Primary Care Physician:  Leonides Grills, MD Primary Gastroenterologist:  Dr. [name]  CHIEF COMPLAINT:  IDA  HPI: [name] [name] is a 62 y.o. female known to Dr. [name]. Listed below is her last office visit with our practice.  "She had a recent hospitalization with chest pain and anemia. Patient has history of coronary artery disease with prior CABG and aortic valve replacement in 2011, she has history of insulin-dependent diabetes mellitus with an insulin pump currently, chronic kidney disease, hyperlipidemia, hypertension, aortic stenosis, chronic GERD, sick sinus syndrome status post pacemaker and chronic diastolic heart failure with most recent EF around 60%.  She has remote history of Hodgkin's disease and also a renal cell carcinoma for which she is status post right nephrectomy. She had radiation for the Hodgkin's disease and this is felt to be partially responsible for her cardiac disease.  She ruled out for MI with recent hospitalization, did have GI consultation for hemoglobin in the 7 range and found to be iron deficient and was Hemoccult-positive on rectal exam. She had undergone colonoscopy with Dr. [name] in December of 2013 because of history of adenomatous polyps but was only found to have mild diverticulosis. Her hospital stay she had upper endoscopy with Dr. Henrene Pastor on 04/06/2013 and this was a normal exam. Hematology consult was recommended,  Patient was transfused 2 units of packed rbc's. She underwent cardiac catheter and had a PCI to LAD and ostial LAD with 2 drug-eluting stents. CVTS Was consuled, however she is felt to be too high risk for a third redo surgery.she is now on Brillinta and aspirin and will need this indefinitely.  She has still been having some chest pain after discharge from the hospital which is relieved by nitroglycerin. She was seen by cardiology earlier this morning and her Imdur was increased.  Repeat CBC was done and hemoglobin on 04/11/2013 with 7.9 and  hemoglobin today is up to 9.1 with hematocrit of 28.3.  Patient says her last episode was of chest pain was on Saturday evening which was definitely relieved by nitroglycerin. She says overall she feels fatigued and doesn't feel that she's bounced back. She has been taking iron supplements and her stools are dark she has no complaints of abdominal pain. She says she thought she saw one little tiny spot of blood on the tissue the other day."  She was scheduled for WCE for further evaluation of her anemia.  It was determined that she needed to be on telemetry due to her pacemaker and that she would be admitted to the hospital as observation status for this procedure.  She spoke with her endocrinologist who recommended during her insulin pump down to half of her normal basal dose and that she have her blood sugars checked every hour.  No complaints at this time.   Past Medical History  Diagnosis Date  . Aortic stenosis     a. bicuspid aortic valve; mean gradient of 20 mmHg in 2/10; b. s/p bioprosth AVR (19-mm Edwards pericardial valve) with a redo coronary artery bypass graft procedure in 07/2009; postoperative Dressler's syndrome    . Coronary artery disease     a. s/p RCA saphenous vein graft in 06/1999; b. multiple preceding PCIs with restenosis; c. total obstruction of RCA in 2006 with patent graft; d. 07/1999 Redo VG->RCA;  e. 03/2013 Cath/PCI: LM 80ost/d (2.75x24 Promus Premier DES), LAD 80ost, LCx nl, RCA 100, VG->PDA ok.  . Chronic diastolic CHF (congestive heart failure)     a. 9.2014  EchoP EF 60-65%, no rwma, bioprosth AVR, mean gradient of 86mHg, mild to mod MR, PASP 543mg.  . Marland Kitchenyperlipidemia   . Hypertension   . Small bowel obstruction     Recurrent; resolved after resection of a lipoma  . Osteopenia      hip on DEXA in October 2007.  . Marland Kitchennemia   . Carcinoma, renal cell 05/2007    Laparoscopic right nephrectomy  . NASH (nonalcoholic steatohepatitis) 1999    -biopsy in 1999  . Diabetes  mellitus 03/2010     TYPE II: Hemoglobin A1c of 7.4 in  03/2010; 8.4 in 06/2010; treated with insulin pump  . Migraines   . Duodenal ulcer     Remote; H. pylori positive  . Colitis, ischemic 2008  . Gastroesophageal reflux disease     ? of GASTROPARESIS; IRRITABLE BOWEL SYNDROME  . Hodgkin's disease 1991    [place]le radiation therapy  . Exertional dyspnea     chest discomfort  . Statin intolerance   . Hypercalcemia     Ca-11.1 in 07/2011  . Vitamin D deficiency   . Costochondritis   . Insomnia   . Iron deficiency anemia     a. 9.2014 EGD: essentially normal.  . Gastroparesis due to DM 05/01/2012  . PONV (postoperative nausea and vomiting)   . Hyperplastic gastric polyp 06/23/2012  . Arthritis   . Osteoporosis   . Pacemaker 08/21/2012    Medtronic Adapta L dual-chamber pacemaker-Dr. TaLovena Le. Anxiety     Past Surgical History  Procedure Laterality Date  . Total abdominal hysterectomy w/ bilateral salpingoophorectomy  2000  . Breast excisional biopsy      benign, bilateral  . Nephrectomy  2008    Laparoscopic right nephrectomy, renal cell carcinoma  . Coronary artery bypass graft  2005, 2011    CABG-most recent SVG to RCA-12/05; RCA occluded with patent graft in 2006; Redo bypass in 07/2009  . Tumor excision  1981    removal of Hodgkins lymphoma  . Exploratory laparotomy w/ bowel resection      Small bowel resection of lipoma  . Aortic valve replacement  2011    Aortic valve replacement surgery with a 19 mm bioprosthetic device post redo CABG January 2011.  . Colonoscopy      Multiple with adenomatous polyps  . Cervical biopsy    . Liver biopsy    . Bone marrow biopsy    . Esophagogastroduodenoscopy    . Esophagogastroduodenoscopy  06/23/2012    Procedure: ESOPHAGOGASTRODUODENOSCOPY (EGD);  Surgeon: CaGatha MayerMD;  [facility];  Service: Endoscopy;  Laterality: N/A;  . Colonoscopy  06/23/2012    Procedure: COLONOSCOPY;  Surgeon: CaGatha MayerMD;   Location: [facility];  Service: Endoscopy;  Laterality: N/A;  . Venia Minksilation  06/23/2012    Procedure: MAVenia MinksILATION;  Surgeon: CaGatha MayerMD;  Location: [facility];  Service: Endoscopy;;  54 fr  . Pacemaker insertion  08/21/2012    Medtronic Adapta L dual-chamber pacemaker  . Esophagogastroduodenoscopy N/A 04/06/2013    Procedure: ESOPHAGOGASTRODUODENOSCOPY (EGD);  Surgeon: JoIrene ShipperMD;  [facility];  Service: Endoscopy;  Laterality: N/A;    Prior to Admission medications   Medication Sig Start Date End Date Taking? Authorizing Provider  ALPRAZolam (XDuanne Moron0.5 MG tablet Take 0.25 mg by mouth at bedtime as needed for sleep.    Historical Provider, MD  aspirin EC 81 MG tablet Take 81 mg by mouth daily.    Historical  Provider, MD  Calcium Carbonate-Vitamin D (RA CALCIUM PLUS VITAMIN D) 600-400 MG-UNIT per tablet Take 1 tablet by mouth 2 (two) times daily.     Historical Provider, MD  cholecalciferol (VITAMIN D) 1000 UNITS tablet Take 1,000 Units by mouth daily.      Historical Provider, MD  desoximetasone (TOPICORT) 0.05 % cream Apply 1 application topically 2 (two) times daily as needed. When skin starts peeling    Historical Provider, MD  docusate sodium (COLACE) 100 MG capsule Take 1 capsule (100 mg total) by mouth 2 (two) times daily. 04/11/13   Rogelia Mire, NP  ezetimibe (ZETIA) 10 MG tablet Take 10 mg by mouth at bedtime.    Historical Provider, MD  fenofibrate (TRICOR) 145 MG tablet Take 145 mg by mouth at bedtime.    Historical Provider, MD  ferrous fumarate (HEMOCYTE - 106 MG FE) 325 (106 FE) MG TABS tablet Take 1 tablet twice a day on empty stomach with Vit. C 04/27/13   Lennis P Marko Plume, MD  fluticasone (FLONASE) 50 MCG/ACT nasal spray Place 2 sprays into the nose daily as needed for rhinitis. For nasal congestion    Historical Provider, MD  furosemide (LASIX) 40 MG tablet Take 1.5 tablets (60 mg total) by mouth daily. 04/20/13   Burtis Junes, NP   GLUCAGON EMERGENCY 1 MG injection  03/13/13   Historical Provider, MD  Insulin Human (INSULIN PUMP) 100 unit/ml SOLN Inject into the skin continuous. Apidra Basal Rate: Midnight-1100=2.5 1100-1300=4.2 1300-1700=4.3 1700-midnight=4.5    Historical Provider, MD  isosorbide mononitrate (IMDUR) 30 MG 24 hr tablet Take 1 tablet (30 mg total) by mouth at bedtime. 04/20/13   Burtis Junes, NP  Magnesium 250 MG TABS Take 1 tablet by mouth daily.    Historical Provider, MD  metoprolol (LOPRESSOR) 50 MG tablet Take 25-50 mg by mouth 2 (two) times daily. Takes 68m in the am & 541min the pm    Historical Provider, MD  Multiple Vitamins-Minerals (MULTIVITAMIN WITH MINERALS) tablet Take 1 tablet by mouth daily.      Historical Provider, MD  NITROLINGUAL 0.4 MG/SPRAY spray USE ONE SPRAY UNDER TONGUE EVERY 5 MINUTES AS NEEDED FOR CHEST PAIN. MAY REPEAT THREE TIMES 10/19/12   KaLendon ColonelNP  omeprazole (PRILOSEC OTC) 20 MG tablet Take 20 mg by mouth daily.    Historical Provider, MD  polyethylene glycol (MIRALAX / GLYCOLAX) packet Take 8.5 g by mouth daily as needed. For constipation    Historical Provider, MD  potassium chloride SA (K-DUR,KLOR-CON) 20 MEQ tablet Take 40 mEq by mouth daily.    Historical Provider, MD  pravastatin (PRAVACHOL) 10 MG tablet Take 10 mg by mouth at bedtime.    Historical Provider, MD  Ticagrelor (BRILINTA) 90 MG TABS tablet Take 1 tablet (90 mg total) by mouth 2 (two) times daily. 04/11/13   ChRogelia MireNP  vitamin C (ASCORBIC ACID) 500 MG tablet Take 500 mg by mouth daily.    Historical Provider, MD    No current facility-administered medications for this encounter.    Allergies as of 04/20/2013 - Review Complete 04/20/2013  Allergen Reaction Noted  . Clindamycin/lincomycin Anaphylaxis and Shortness Of Breath 12/20/2010  . Penicillins Anaphylaxis, Hives, and Rash 05/20/2008  . Gentamycin [gentamicin] Hives, Itching, and Rash 12/21/2011  . Tizanidine Other  (See Comments) 12/02/2012  . Atorvastatin Other (See Comments) 05/20/2008  . Avandia [rosiglitazone maleate]  05/20/2008  . Codeine Nausea And Vomiting 05/20/2008  . Crestor [rosuvastatin] Other (  See Comments) 04/03/2013  . Erythromycin Nausea And Vomiting 05/20/2008  . Metformin  05/20/2008  . Novolog [insulin aspart] Itching 04/04/2013  . Plavix [clopidogrel bisulfate]  04/13/2013  . Pravastatin Other (See Comments) 04/03/2013  . Simvastatin  05/20/2008  . Cephalexin Swelling and Rash 05/20/2008  . Iodinated diagnostic agents Other (See Comments) 07/20/2011  . Neosporin [neomycin-bacitracin zn-polymyx] Rash 06/17/2012    Family History  Problem Relation Age of Onset  . Heart attack Mother   . Heart disease Mother   . Heart attack Father   . Diabetes Father   . Heart disease Father   . Hypertension Brother   . Glaucoma Brother   . Lung cancer Sister     meta  . Breast cancer Sister     half sister (Paternal)  . Ovarian cancer Sister   . Cancer Sister     cervical and lung - half sister (maternal)  . Breast cancer Sister   . Colon cancer Neg Hx   . Stomach cancer Neg Hx     History   Social History  . Marital Status: Married    Spouse Name: N/A    Number of Children: 2  . Years of Education: N/A   Occupational History  . full time     elementary principal   Social History Main Topics  . Smoking status: Never Smoker   . Smokeless tobacco: Never Used  . Alcohol Use: No  . Drug Use: No  . Sexual Activity: Yes   Other Topics Concern  . 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    Review of Systems: Ten point ROS is O/W negative except as mentioned in HPI.  Physical Exam: Vital signs in last 24 hours: Pulse Rate:  [88] 88 (10/08 1352) BP: (128)/(48) 128/48 mmHg (10/08 1352) Weight:  [122 lb 6.4 oz (55.52 kg)] 122 lb 6.4 oz (55.52 kg) (10/08 1352)   General:   Alert, Well-developed,  well-nourished, pleasant and cooperative in NAD Head:  Normocephalic and atraumatic. Eyes:  Sclera clear, no icterus.  Conjunctiva pink. Ears:  Normal auditory acuity. Mouth:  No deformity or lesions.  Oropharynx pink & moist. Lungs:  Clear throughout to auscultation.  No wheezes, crackles, or rhonchi.  Sternotomy scars noted. Heart:  Regular rate and rhythm; click of aortic valve heard Abdomen:  Soft, nontender and nondistended. No masses, hepatosplenomegaly or hernias noted. Normal bowel sounds, without guarding, and without rebound.   Rectal:  Deferred. Msk:  Symmetrical without gross deformities. Normal posture. Pulses:  Normal pulses noted. Extremities:  Without clubbing or edema. Neurologic:  Alert and  oriented x4;  grossly normal neurologically. Skin:  Intact without significant lesions or rashes. Psych:  Alert and cooperative. Normal mood and affect.  Lab Results:  Recent Labs  04/27/13 1358  WBC 7.8  HGB 9.6*  HCT 31.6*  PLT 197   Impression / Plan:  #9 62 year old female with multiple comorbidities and severe coronary artery disease status post prior CABG and aortic valve replacement with very recent cardiac catheter with placement of drug-eluting stents x2 to the LAD-.patient still with intermittent anginal symptoms  #2 aortic stenosis  #3 sick sinus syndrome status post pacemaker  #4 insulin-dependent diabetes mellitus with insulin pump  #5 chronic kidney disease  #6 progressive anemia, iron deficient, heme positive-negative EGD during her recent hospitalization and negative colonoscopy December 2013. Will need to rule out small bowel source i.e. AVMs  #  7 hypertension  #8 history of renal cell CA status post right nephrectomy  #9 remote history of Hodgkin's disease status post radiation  #10 chronic anticoagulation- Brilinta and ASA  -Admit patient for observation on telemetry for WCE procedure.  Monitor blood sugars every hour.        LOS: 0 days   ZEHR,  JESSICA D.  04/30/2013, 9:08 AM  Chart was reviewed and patient was examined. X-rays and lab were reviewed.    I agree with management and plans.  Sandy Salaam. Deatra Ina, M.D., [facility] Cell [phone]

## 2013-04-30 NOTE — Progress Notes (Signed)
Pt received discharge instructions. Pt states "no questions about discharge instructions". Pt's vital signs are stable, pt states "she feels better".  Pt's capsule monitor was taken off at 22.20 and placed at the nursing station.

## 2013-04-30 NOTE — Discharge Summary (Signed)
[name] [name]  Name: [name] [name] MRN: [id number] DOB: 1951-02-02 62 y.o. PCP:  [name] Grills, MD  Date of Admission: 04/30/2013  7:13 AM Date of Discharge: 04/30/2013 Primary Gastroenterologist: [name] Rusk, MD Discharging Physician: [name] Spittle, MD  Discharge Diagnosis: 1. Iron Deficiency Anemia 2. Diabetes, on insulin pump 3. Multiple significant medical problems.   Consultations:none  Procedures Performed:  none  GI Procedures: [place] Small Bowel Capsule Study  History/Physical Exam:  See Admission H&P  Admission HPI:  Per [name] [name], P.A.  [name] [name] is a 62 y.o. female known to [name] [name]. Listed below is her last office visit with our practice.  "She had a recent hospitalization with chest pain and anemia. Patient has history of coronary artery disease with prior CABG and aortic valve replacement in 2011, she has history of insulin-dependent diabetes mellitus with an insulin pump currently, chronic kidney disease, hyperlipidemia, hypertension, aortic stenosis, chronic GERD, sick sinus syndrome status post pacemaker and chronic diastolic heart failure with most recent EF around 60%. She has remote history of Hodgkin's disease and also a renal cell carcinoma for which she is status post right nephrectomy. She had radiation for the Hodgkin's disease and this is felt to be partially responsible for her cardiac disease. She ruled out for MI with recent hospitalization, did have GI consultation for hemoglobin in the 7 range and found to be iron deficient and was Hemoccult-positive on rectal exam. She had undergone colonoscopy with [name] [name] in December of 2013 because of history of adenomatous polyps but was only found to have mild diverticulosis. Her hospital stay she had upper endoscopy with Dr. Henrene [name] on 04/06/2013 and this was a normal exam. Hematology consult was recommended,  Patient was transfused 2 units of packed rbc's. She underwent  cardiac catheter and had a PCI to LAD and ostial LAD with 2 drug-eluting stents. CVTS Was consuled, however she is felt to be too high risk for a third redo surgery.she is now on Brillinta and aspirin and will need this indefinitely. She has still been having some chest pain after discharge from the hospital which is relieved by nitroglycerin. She was seen by cardiology earlier this morning and her Imdur was increased.  Repeat CBC was done and hemoglobin on 04/11/2013 with 7.9 and hemoglobin today is up to 9.1 with hematocrit of 28.3. Patient says her last episode was of chest pain was on Saturday evening which was definitely relieved by nitroglycerin. She says overall she feels fatigued and doesn't feel that she's bounced back. She has been taking iron supplements and her stools are dark she has no complaints of abdominal pain. She says she thought she saw one little tiny spot of blood on the tissue the other day."  She was scheduled for WCE for further evaluation of her anemia. It was determined that she needed to be on telemetry due to her pacemaker and that she would be admitted to the hospital as observation status for this procedure. She spoke with her endocrinologist who recommended during her insulin pump down to half of her normal basal dose and that she have her blood sugars checked every hour. No complaints at this time.   Hospital Course by problem list: 1. Iron deficiency anemia. Patient was admitted for observation as there was concern for glycemic management while underwent capsule study. Patient's approximate 12 hour stay at the hospital was uneventful. She was discharge home following completion of study. Capsule study will be read and patient notified of  results at a later date.   2. Diabetes. Patient was admitted to a regular bed with frequent CBG monitoring. Patient self-managed her insulin pump during her several hour stay at the hospital.  No hypoglycemic events nor significant  hyperglycemia during the course of her stay.   3. Multiple medical problems. Stable, no acute issue during observational stay.  Discharge Vitals:  BP 115/65  Pulse 73  Temp(Src) 97.7 F (36.5 C) (Oral)  Resp 18  Ht 5' 2.5" (1.588 m)  SpO2 100%  Discharge Labs:  Results for orders placed during the hospital encounter of 04/30/13 (from the past 24 hour(s))  GLUCOSE, CAPILLARY     Status: None   Collection Time    04/30/13 10:12 AM      Result Value Range   Glucose-Capillary 89  70 - 99 mg/dL   Comment 1 Notify RN    GLUCOSE, CAPILLARY     Status: None   Collection Time    04/30/13 11:10 AM      Result Value Range   Glucose-Capillary 85  70 - 99 mg/dL   Comment 1 Notify RN    GLUCOSE, CAPILLARY     Status: None   Collection Time    04/30/13 12:10 PM      Result Value Range   Glucose-Capillary 93  70 - 99 mg/dL   Comment 1 Notify RN    GLUCOSE, CAPILLARY     Status: Abnormal   Collection Time    04/30/13  1:22 PM      Result Value Range   Glucose-Capillary 145 (*) 70 - 99 mg/dL  GLUCOSE, CAPILLARY     Status: Abnormal   Collection Time    04/30/13  4:15 PM      Result Value Range   Glucose-Capillary 114 (*) 70 - 99 mg/dL   Comment 1 Notify RN    GLUCOSE, CAPILLARY     Status: Abnormal   Collection Time    04/30/13  7:23 PM      Result Value Range   Glucose-Capillary 115 (*) 70 - 99 mg/dL    Disposition and follow-up:   [name] was discharged from [facility] in stable condition.    Follow-up Appointments: Discharge Orders   Future Appointments Provider Department Dept Phone   05/18/2013 1:45 PM [place] [phone]   05/18/2013 2:15 PM [name], MD [place] [phone]   05/22/2013 1:45 PM Harriet Masson, [place] [facility] [place]   05/25/2013 8:45 AM Larey Dresser, MD [facility] [phone]   Future  Orders Complete By Expires   Resume previous diet  As directed       Discharge Medications:   Medication List         ALPRAZolam 0.5 MG tablet  Commonly known as:  XANAX  Take 0.25 mg by mouth at bedtime as needed for sleep.     aspirin EC 81 MG tablet  Take 81 mg by mouth at bedtime.     cholecalciferol 1000 UNITS tablet  Commonly known as:  VITAMIN D  Take 1,000 Units by mouth at bedtime.     desoximetasone 0.05 % cream  Commonly known as:  TOPICORT  Apply 1 application topically 2 (two) times daily as needed. When skin starts peeling     docusate sodium 100 MG capsule  Commonly known as:  COLACE  Take 1 capsule (100  mg total) by mouth 2 (two) times daily.     ezetimibe 10 MG tablet  Commonly known as:  ZETIA  Take 10 mg by mouth at bedtime.     fenofibrate 145 MG tablet  Commonly known as:  TRICOR  Take 145 mg by mouth at bedtime.     ferrous fumarate 325 (106 FE) MG Tabs tablet  Commonly known as:  HEMOCYTE - 106 mg FE  Take 1 tablet by mouth 2 (two) times daily. Take with Vitamin C tablet     fluticasone 50 MCG/ACT nasal spray  Commonly known as:  FLONASE  Place 2 sprays into the nose daily as needed for rhinitis. For nasal congestion     furosemide 40 MG tablet  Commonly known as:  LASIX  Take 1.5 tablets (60 mg total) by mouth daily.     GLUCAGON EMERGENCY 1 MG injection  Generic drug:  glucagon  Inject 1 mg into the vein once as needed (for severe reaction).     insulin pump 100 unit/ml Soln  - Inject into the skin continuous. Apidra  - Basal Rate: Midnight-1100=2.5  - 1100-1300=4.2  - 1300-1700=4.3  - 1700-midnight=4.5     isosorbide mononitrate 30 MG 24 hr tablet  Commonly known as:  IMDUR  Take 1 tablet (30 mg total) by mouth at bedtime.     Magnesium 250 MG Tabs  Take 250 mg by mouth daily.     metoprolol 50 MG tablet  Commonly known as:  LOPRESSOR  Take 25-50 mg by mouth 2 (two) times daily. Takes 31m in the am & 516min the pm      multivitamin with minerals tablet  Take 1 tablet by mouth daily.     nitroGLYCERIN 0.4 MG/SPRAY spray  Commonly known as:  NITROLINGUAL  Place 1 spray under the tongue every 5 (five) minutes as needed for chest pain.     omeprazole 20 MG tablet  Commonly known as:  PRILOSEC OTC  Take 20 mg by mouth daily.     polyethylene glycol packet  Commonly known as:  MIRALAX / GLYCOLAX  Take 8.5 g by mouth daily as needed. For constipation     potassium chloride SA 20 MEQ tablet  Commonly known as:  K-DUR,KLOR-CON  Take 40 mEq by mouth daily.     pravastatin 10 MG tablet  Commonly known as:  PRAVACHOL  Take 10 mg by mouth at bedtime.     RA CALCIUM PLUS VITAMIN D 600-400 MG-UNIT per tablet  Generic drug:  Calcium Carbonate-Vitamin D  Take 1 tablet by mouth 2 (two) times daily.     Ticagrelor 90 MG Tabs tablet  Commonly known as:  BRILINTA  Take 1 tablet (90 mg total) by mouth 2 (two) times daily.     vitamin C 500 MG tablet  Commonly known as:  ASCORBIC ACID  Take 250 mg by mouth daily.        Signed: PaTye Savoy0/03/2013, 8:54 PM

## 2013-05-01 ENCOUNTER — Encounter (HOSPITAL_COMMUNITY): Payer: Self-pay | Admitting: Internal Medicine

## 2013-05-01 ENCOUNTER — Ambulatory Visit (HOSPITAL_COMMUNITY): Payer: BC Managed Care – PPO

## 2013-05-04 ENCOUNTER — Telehealth: Payer: Self-pay | Admitting: Oncology

## 2013-05-04 ENCOUNTER — Telehealth: Payer: Self-pay

## 2013-05-04 ENCOUNTER — Ambulatory Visit (HOSPITAL_COMMUNITY): Payer: BC Managed Care – PPO

## 2013-05-04 NOTE — Telephone Encounter (Signed)
Sent medical records to Dr. [name] ofiice from Dr. Marko Plume.

## 2013-05-04 NOTE — Telephone Encounter (Signed)
Message copied by Marlon Pel on Mon May 04, 2013  4:23 PM ------      Message from: Gatha Mayer      Created: Mon May 04, 2013  4:04 PM      Regarding: normal capsule endo       Capsule endo is Ok            No further tests            Stay on iron supplements and f/u PCP on this            Let me know if she has any ?'s ------

## 2013-05-04 NOTE — Telephone Encounter (Signed)
Medical Oncology  Returned patient's call re capsule endoscopy ok and questions re oral iron. She is tolerating the ferrous fumarate fairly well since last week, but is having some GI discomfort ~ an hour after AM dose, at which time she eats breakfast and feels better, and again after PM dose. Told her to decrease to once daily and ok to hold intermittently if needed, but to try to continue at least until follow up visit [place] in ~ 3 wks.  Told patient nothing that appears to be leukemia on blood work done. Discussed multiple factors likely contributing to anemia, including heme + stools documented in hospital, chronic disease, renal insufficiency.   Patient is in agreement with recommendations above and appreciated call. Godfrey Pick, MD

## 2013-05-04 NOTE — Telephone Encounter (Signed)
Patient advised She would like a copy to go to Dr. Marko Plume.

## 2013-05-05 ENCOUNTER — Telehealth: Payer: Self-pay

## 2013-05-05 NOTE — Telephone Encounter (Signed)
cALL TO VERIFY PATIEN'S IMDUR

## 2013-05-06 ENCOUNTER — Ambulatory Visit (HOSPITAL_COMMUNITY): Payer: BC Managed Care – PPO

## 2013-05-08 ENCOUNTER — Ambulatory Visit (HOSPITAL_COMMUNITY): Payer: BC Managed Care – PPO

## 2013-05-11 ENCOUNTER — Ambulatory Visit (HOSPITAL_COMMUNITY): Payer: BC Managed Care – PPO

## 2013-05-13 ENCOUNTER — Ambulatory Visit (HOSPITAL_COMMUNITY): Payer: BC Managed Care – PPO

## 2013-05-15 ENCOUNTER — Ambulatory Visit (HOSPITAL_COMMUNITY): Payer: BC Managed Care – PPO

## 2013-05-17 ENCOUNTER — Other Ambulatory Visit: Payer: Self-pay | Admitting: Oncology

## 2013-05-17 DIAGNOSIS — D649 Anemia, unspecified: Secondary | ICD-10-CM

## 2013-05-17 DIAGNOSIS — D509 Iron deficiency anemia, unspecified: Secondary | ICD-10-CM

## 2013-05-18 ENCOUNTER — Encounter: Payer: Self-pay | Admitting: Oncology

## 2013-05-18 ENCOUNTER — Ambulatory Visit: Payer: BC Managed Care – PPO | Admitting: Lab

## 2013-05-18 ENCOUNTER — Ambulatory Visit (HOSPITAL_BASED_OUTPATIENT_CLINIC_OR_DEPARTMENT_OTHER): Payer: BC Managed Care – PPO | Admitting: Oncology

## 2013-05-18 ENCOUNTER — Other Ambulatory Visit (HOSPITAL_BASED_OUTPATIENT_CLINIC_OR_DEPARTMENT_OTHER): Payer: BC Managed Care – PPO | Admitting: Lab

## 2013-05-18 ENCOUNTER — Telehealth: Payer: Self-pay | Admitting: *Deleted

## 2013-05-18 ENCOUNTER — Ambulatory Visit (HOSPITAL_COMMUNITY)
Admission: RE | Admit: 2013-05-18 | Discharge: 2013-05-18 | Disposition: A | Discharge status: Home / Self Care | Payer: BC Managed Care – PPO | Source: Ambulatory Visit | Attending: Oncology | Admitting: Oncology

## 2013-05-18 ENCOUNTER — Ambulatory Visit (HOSPITAL_COMMUNITY): Payer: BC Managed Care – PPO

## 2013-05-18 VITALS — BP 135/49 | HR 90 | Temp 98.2°F | Resp 18 | Ht 62.5 in | Wt 124.4 lb

## 2013-05-18 DIAGNOSIS — D649 Anemia, unspecified: Secondary | ICD-10-CM

## 2013-05-18 DIAGNOSIS — N289 Disorder of kidney and ureter, unspecified: Secondary | ICD-10-CM

## 2013-05-18 DIAGNOSIS — D509 Iron deficiency anemia, unspecified: Secondary | ICD-10-CM

## 2013-05-18 DIAGNOSIS — I2510 Atherosclerotic heart disease of native coronary artery without angina pectoris: Secondary | ICD-10-CM

## 2013-05-18 DIAGNOSIS — C649 Malignant neoplasm of unspecified kidney, except renal pelvis: Secondary | ICD-10-CM

## 2013-05-18 DIAGNOSIS — E039 Hypothyroidism, unspecified: Secondary | ICD-10-CM

## 2013-05-18 DIAGNOSIS — I5032 Chronic diastolic (congestive) heart failure: Secondary | ICD-10-CM

## 2013-05-18 LAB — URINALYSIS, MICROSCOPIC - CHCC
Blood: NEGATIVE
Glucose: 250 mg/dL
Ketones: NEGATIVE mg/dL
Leukocyte Esterase: NEGATIVE
Protein: NEGATIVE mg/dL
Specific Gravity, Urine: 1.005 (ref 1.003–1.035)
Urobilinogen, UR: 0.2 mg/dL (ref 0.2–1)

## 2013-05-18 LAB — CBC & DIFF AND RETIC
Basophils Absolute: 0 10*3/uL (ref 0.0–0.1)
Eosinophils Absolute: 0.1 10*3/uL (ref 0.0–0.5)
HGB: 7.7 g/dL — ABNORMAL LOW (ref 11.6–15.9)
Immature Retic Fract: 22.3 % — ABNORMAL HIGH (ref 1.60–10.00)
MCV: 90.6 fL (ref 79.5–101.0)
MONO#: 0.4 10*3/uL (ref 0.1–0.9)
MONO%: 4.6 % (ref 0.0–14.0)
NEUT#: 7.4 10*3/uL — ABNORMAL HIGH (ref 1.5–6.5)
RDW: 22.9 % — ABNORMAL HIGH (ref 11.2–14.5)
Retic %: 9.38 % — ABNORMAL HIGH (ref 0.70–2.10)
Retic Ct Abs: 259.83 10*3/uL — ABNORMAL HIGH (ref 33.70–90.70)
WBC: 9.2 10*3/uL (ref 3.9–10.3)
lymph#: 1.2 10*3/uL (ref 0.9–3.3)

## 2013-05-18 LAB — PREPARE RBC (CROSSMATCH)

## 2013-05-18 NOTE — Telephone Encounter (Signed)
appts made and printed. Pt will be at [facility] for an ov on 05/27/13. Per her request gv appt for 05/25/13. She is aware that tx will be added. i emailed MW to add the tx's...td

## 2013-05-18 NOTE — Telephone Encounter (Signed)
Per staff message and POF I have scheduled appts.  JMW  

## 2013-05-18 NOTE — Progress Notes (Signed)
OFFICE PROGRESS NOTE   05/18/2013   Physicians:W.McGough, [name] (endocrine [facility]), [name], D.McLean, G.Lovena Le, B.Bartle   INTERVAL HISTORY:  Patient is seen, alone for visit, in scheduled follow up of recent anemia, in setting of significant comorbid medical problems. She had capsule small bowel study by [facility] as inpatient on 04-30-13, without abnormality documented. She has had minimal right sided epistaxis for past few days when she blows nose, and a couple of large, loose bowel movements early AM today which were black/green (different than previously).She has vague abdominal queasiness now, but no bowel movements now in 4 hours. She continues oral iron once daily. She has felt more poorly since midday 05-16-13, with fatigue, DOE, occasional vertigo sensation. She is not SOB at rest, does not have chest pain or abdominal pain, has had occasional nausea which resolved with ginger ale. She is tolerating oral iron once daily. Last CBC in EMR was at this office 04-27-13 with Hgb 9.6; she did not have this repeated with inpatient GI procedure 04-30-13.   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  Patient was admitted to [facility] from 04-03-13 thru 04-11-13 with progressive dyspnea, orthopnea, unstable angina and Hgb of 7.3 on day of admission, this following outpatient evaluation with cardiopulmonary exercise test with evidence of ischemia. She ruled out for MI. Stool was heme + by GI in hospital; she was transfused 1 unit of PRBCs on 04-03-13, given over 4 hours and note from those MDs that she should have lasix with further transfusions. She began oral iron, did  not tolerate this bid.. EGD 04-06-13 was unremarkable including duodenal biopsy. She had cardiac cath on 04-07-13 with high grade LM and proximal LAD involvement, and diffuse calcific plaque in aorta. CVTS (Dr Cyndia Bent) felt that she was not a candidate for a 3rd re-do surgical procedure particularly due to diffuse aortic calcification.  She had PTCA of left main with stent into ostium of LM into LAD by Dr Angelena Form on 04-09-13; per cardiology she will need lifelong dual antiplatelet therapy with ASA and plavix.     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 She has radiation induced hypothyroidism and the treatment caused or worsened coronary artery disease. 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 She is no longer followed regularly by urology.   Review of systems as above, also: No fever. No HA or neurologic symptoms other than a couple of self limited episodes of vertigo, none now. No cough. No vomiting. No other bleeding. Blood sugars 139-194 today (had been hypoglycemic in 30s in hospital in Sept). No increased swelling in LE. Remainder of 10 point Review of Systems negative/ unchanged.  Objective:  Vital signs in last 24 hours:  BP 135/49  Pulse 90  Temp(Src) 98.2 F (36.8 C) (Oral)  Resp 18  Ht 5' 2.5" (1.588 m)  Wt 124 lb 6.4 oz (56.427 kg)  BMI 22.38 kg/m2  SpO2 100%  Alert, oriented and appropriate, excellent historian as always. Ambulatory without assistance. Looks somewhat chronically ill but as usual looks remarkably good for situation.    HEENT:PERRL, sclerae not icteric, no nystagmus. Oral mucosa moist without  lesions, posterior pharynx clear. Mucous membranes somewhat pale.Irritated area right nasal turbinates without active bleeding. Neck supple. No JVD.  Lymphatics:no cervical,suraclavicular adenopathy Resp: clear to auscultation bilaterally and normal percussion bilaterally, respirations not labored at rest in exam room Cardio: regular rate and rhythm. No gallop. Pacer.  GI: soft, not tender including epigastrium, not distended, a few normally active bowel sounds. No HSM appreciated. Insulin pump in place. Musculoskeletal/ Extremities: without pitting edema, cords, tenderness.  Neuro: CN and cerebellar nonfocal. Psych as above. Skin without rash, ecchymosis, petechiae   Lab Results:  Results for orders placed in visit on 05/18/13  URINALYSIS, MICROSCOPIC - [facility]      Result Value Range   Glucose 250  Negative mg/dL   Bilirubin (Urine) Negative  Negative   Ketones Negative  Negative mg/dL   Specific Gravity, Urine 1.005  1.003 - 1.035   Blood Negative  Negative   pH 7.5  4.6 - 8.0   Protein Negative  Negative- <30 mg/dL   Urobilinogen, UR 0.2  0.2 - 1 mg/dL   Nitrite Negative  Negative   Leukocyte Esterase Negative  Negative   RBC / HPF Negative  0 - 2   WBC, UA 0-2  0 - 2   Epithelial Cells Occasional  Negative- Few  CBC & DIFF AND RETIC      Result Value Range   WBC 9.2  3.9 - 10.3 10e3/uL   NEUT# 7.4 (*) 1.5 - 6.5 10e3/uL   HGB 7.7 (*) 11.6 - 15.9 g/dL   HCT 25.1 (*) 34.8 - 46.6 %   Platelets 172  145 - 400 10e3/uL   MCV 90.6  79.5 - 101.0 fL   MCH 27.8  25.1 - 34.0 pg   MCHC 30.7 (*) 31.5 - 36.0 g/dL   RBC 2.77 (*) 3.70 - 5.45 10e6/uL   RDW 22.9 (*) 11.2 - 14.5 %   lymph# 1.2  0.9 - 3.3 10e3/uL   MONO# 0.4  0.1 - 0.9 10e3/uL   Eosinophils Absolute 0.1  0.0 - 0.5 10e3/uL   Basophils Absolute 0.0  0.0 - 0.1 10e3/uL   NEUT% 80.3 (*) 38.4 - 76.8 %   LYMPH% 13.4 (*) 14.0 - 49.7 %   MONO% 4.6  0.0 - 14.0 %   EOS% 1.4  0.0 - 7.0 %   BASO% 0.3  0.0 - 2.0 %   nRBC 0  0 - 0 %    Retic % 9.38 (*) 0.70 - 2.10 %   Retic Ct Abs 259.83 (*) 33.70 - 90.70 10e3/uL   Immature Retic Fract 22.30 (*) 1.60 - 10.00 %    Erythropoietin level sent and pending.  Hemoccult cards x3 given today, to return when she comes back to this office later this week  Studies/Results:  No results found.  Medications: I have reviewed the patient's current medications. Per cardiology she needs to continue dual antiplatelet therapy lifelong  DISCUSSION: she is symptomatic from recurrent significant anemia, which may be intermittent GI blood loss given history of change in bowel movements in past 24 hours and heme + stool documented in Sept. No blood in urine by UA today. She agrees with transfusion, and we will give 1 unit PRBCs over 4 hours on 10-28 with po lasix  40 mg given ~ 30 min into transfusion. She is also set up for a second unit of PRBCs over 4 hours with same lasix on 05-20-13. I will see her back with repeat CBC next week and consider IV iron then if otherwise stable.  Assessment/Plan: 1.Progressive anemia again symptomatic: iron deficient by labs Sept, likely intermittent GI blood loss with history as above. Will transfuse 1-2 units of PRBCs over next 2 days. Follow up hemoccult cards. UGI and capsule small bowel study since Sept did not find specific site of bleeding. Epo level also sent and pending, as this may be additional part of problem. 2.IIA nodular sclerosing Hodgkins treated with mantle radiation in 1980s.  3.post right nephrectomy for renal cell carcinoma 2008, not known recurrent.UA without blood. 4.Cardiac disease: CAD post RCA vein graft and redo CABG 2011, aortic stenosis post aortic valve 2011, sinus node dysfunction with brief episodes PAF, dual chamber pacer Jan 2014. Now post stent to LM into proximal LAD for progressive involvement there, not candidate for another surgery per CVTS.  5. Insulin dependent diabetes: now followed by Dr [name] at [facility], with  consideration of insulin allergy. On insulin pump. 6.hypothyroidism post RT, on replacement, TFTs ok Sept 7.rectal bleeding ~ 12-2012 thought from ischemic colitis. Post colonoscopy 06-2012 with no polyps.  8.renal insufficiency: single kidney, creatinines 1.3 - 1.4 in late Sept, with additional diuresis done during that hospitalization   Patient has good understanding of how serious and difficult her medical situation is. She is appreciative of care by all physicians.     Finley Chevez P, MD   05/18/2013, 2:52 PM

## 2013-05-19 ENCOUNTER — Telehealth: Payer: Self-pay | Admitting: Oncology

## 2013-05-19 ENCOUNTER — Ambulatory Visit (HOSPITAL_BASED_OUTPATIENT_CLINIC_OR_DEPARTMENT_OTHER): Payer: BC Managed Care – PPO

## 2013-05-19 VITALS — BP 120/48 | HR 83 | Temp 97.5°F | Resp 18

## 2013-05-19 DIAGNOSIS — D649 Anemia, unspecified: Secondary | ICD-10-CM

## 2013-05-19 DIAGNOSIS — I5032 Chronic diastolic (congestive) heart failure: Secondary | ICD-10-CM

## 2013-05-19 LAB — ERYTHROPOIETIN: Erythropoietin: 57.8 m[IU]/mL — ABNORMAL HIGH (ref 2.6–18.5)

## 2013-05-19 LAB — HOLD TUBE, BLOOD BANK

## 2013-05-19 MED ORDER — ACETAMINOPHEN 325 MG PO TABS
650.0000 mg | ORAL_TABLET | Freq: Once | ORAL | Status: AC
  Administered 2013-05-19: 650 mg via ORAL

## 2013-05-19 MED ORDER — SODIUM CHLORIDE 0.9 % IV SOLN
250.0000 mL | Freq: Once | INTRAVENOUS | Status: AC
  Administered 2013-05-19: 250 mL via INTRAVENOUS

## 2013-05-19 MED ORDER — FUROSEMIDE 20 MG PO TABS
40.0000 mg | ORAL_TABLET | Freq: Once | ORAL | Status: AC
  Administered 2013-05-19: 40 mg via ORAL

## 2013-05-19 MED ORDER — ACETAMINOPHEN 325 MG PO TABS
ORAL_TABLET | ORAL | Status: AC
  Filled 2013-05-19: qty 2

## 2013-05-19 MED ORDER — FUROSEMIDE 20 MG PO TABS
ORAL_TABLET | ORAL | Status: AC
  Filled 2013-05-19: qty 2

## 2013-05-19 NOTE — Patient Instructions (Signed)
Blood Transfusion Information WHAT IS A BLOOD TRANSFUSION? A transfusion is the replacement of blood or some of its parts. Blood is made up of multiple cells which provide different functions.  Red blood cells carry oxygen and are used for blood loss replacement.  White blood cells fight against infection.  Platelets control bleeding.  Plasma helps clot blood.  Other blood products are available for specialized needs, such as hemophilia or other clotting disorders. BEFORE THE TRANSFUSION  Who gives blood for transfusions?   You may be able to donate blood to be used at a later date on yourself (autologous donation).  Relatives can be asked to donate blood. This is generally not any safer than if you have received blood from a stranger. The same precautions are taken to ensure safety when a relative's blood is donated.  Healthy volunteers who are fully evaluated to make sure their blood is safe. This is blood bank blood. Transfusion therapy is the safest it has ever been in the practice of medicine. Before blood is taken from a donor, a complete history is taken to make sure that person has no history of diseases nor engages in risky social behavior (examples are intravenous drug use or sexual activity with multiple partners). The donor's travel history is screened to minimize risk of transmitting infections, such as malaria. The donated blood is tested for signs of infectious diseases, such as HIV and hepatitis. The blood is then tested to be sure it is compatible with you in order to minimize the chance of a transfusion reaction. If you or a relative donates blood, this is often done in anticipation of surgery and is not appropriate for emergency situations. It takes many days to process the donated blood. RISKS AND COMPLICATIONS Although transfusion therapy is very safe and saves many lives, the main dangers of transfusion include:   Getting an infectious disease.  Developing a  transfusion reaction. This is an allergic reaction to something in the blood you were given. Every precaution is taken to prevent this. The decision to have a blood transfusion has been considered carefully by your caregiver before blood is given. Blood is not given unless the benefits outweigh the risks. AFTER THE TRANSFUSION  Right after receiving a blood transfusion, you will usually feel much better and more energetic. This is especially true if your red blood cells have gotten low (anemic). The transfusion raises the level of the red blood cells which carry oxygen, and this usually causes an energy increase.  The nurse administering the transfusion will monitor you carefully for complications. HOME CARE INSTRUCTIONS  No special instructions are needed after a transfusion. You may find your energy is better. Speak with your caregiver about any limitations on activity for underlying diseases you may have. SEEK MEDICAL CARE IF:   Your condition is not improving after your transfusion.  You develop redness or irritation at the intravenous (IV) site. SEEK IMMEDIATE MEDICAL CARE IF:  Any of the following symptoms occur over the next 12 hours:  Shaking chills.  You have a temperature by mouth above 102 F (38.9 C), not controlled by medicine.  Chest, back, or muscle pain.  People around you feel you are not acting correctly or are confused.  Shortness of breath or difficulty breathing.  Dizziness and fainting.  You get a rash or develop hives.  You have a decrease in urine output.  Your urine turns a dark color or changes to pink, red, or brown. Any of the following   symptoms occur over the next 10 days:  You have a temperature by mouth above 102 F (38.9 C), not controlled by medicine.  Shortness of breath.  Weakness after normal activity.  The white part of the eye turns yellow (jaundice).  You have a decrease in the amount of urine or are urinating less often.  Your  urine turns a dark color or changes to pink, red, or brown. Document Released: 07/06/2000 Document Revised: 10/01/2011 Document Reviewed: 02/23/2008 [employer] Patient Information 2014 [employer].  

## 2013-05-19 NOTE — Progress Notes (Signed)
Blood continues at 90 cc/hr with no signs of reaction.

## 2013-05-19 NOTE — Progress Notes (Signed)
BLOOD INFUSING AND BEING TOLERATED WELL AT 90 CCC/HR.  CONTINUES AT THIS RATE.  PATIENT AMBULATING TO BATHROOM S/P LASIX.  DENIES DIZZINESS.

## 2013-05-19 NOTE — Telephone Encounter (Signed)
, °

## 2013-05-19 NOTE — Progress Notes (Signed)
Transfusion ended at this time.  0.9% NS infusing at 90 cc/hr.

## 2013-05-19 NOTE — Progress Notes (Signed)
Reports has taken lasix 67m already today and takes cardiac meds.  Tylenol affects her blood sugar but said she will check her blood sugar every 30 minutes.  Wearing an insulin pump.  Tech en route to get blood unit.

## 2013-05-19 NOTE — Progress Notes (Signed)
Blood started at 1032 am at 50 cc/hr x 13 ml.  Tolerated well.  Rate  Increased to 90 cc/hr for a 4 hr infusion.  No signs of reaction and no distress.

## 2013-05-19 NOTE — Progress Notes (Signed)
Reports her blood sugar = 250 in the lobby while waiting for transfusion.  At 11:30 checked her own blood sugar and reports blood sugar = 225 and "I'm doing good."  Wearing insulin pump.

## 2013-05-20 ENCOUNTER — Ambulatory Visit (HOSPITAL_BASED_OUTPATIENT_CLINIC_OR_DEPARTMENT_OTHER): Payer: BC Managed Care – PPO

## 2013-05-20 ENCOUNTER — Ambulatory Visit (HOSPITAL_COMMUNITY): Payer: BC Managed Care – PPO

## 2013-05-20 VITALS — BP 117/52 | HR 84 | Temp 97.0°F | Resp 20

## 2013-05-20 DIAGNOSIS — D649 Anemia, unspecified: Secondary | ICD-10-CM

## 2013-05-20 DIAGNOSIS — I5032 Chronic diastolic (congestive) heart failure: Secondary | ICD-10-CM

## 2013-05-20 DIAGNOSIS — N289 Disorder of kidney and ureter, unspecified: Secondary | ICD-10-CM

## 2013-05-20 MED ORDER — FUROSEMIDE 20 MG PO TABS
ORAL_TABLET | ORAL | Status: AC
  Filled 2013-05-20: qty 2

## 2013-05-20 MED ORDER — ACETAMINOPHEN 325 MG PO TABS
650.0000 mg | ORAL_TABLET | Freq: Once | ORAL | Status: AC
  Administered 2013-05-20: 650 mg via ORAL

## 2013-05-20 MED ORDER — SODIUM CHLORIDE 0.9 % IV SOLN
250.0000 mL | Freq: Once | INTRAVENOUS | Status: AC
  Administered 2013-05-20: 250 mL via INTRAVENOUS

## 2013-05-20 MED ORDER — ACETAMINOPHEN 325 MG PO TABS
ORAL_TABLET | ORAL | Status: AC
  Filled 2013-05-20: qty 2

## 2013-05-20 MED ORDER — FUROSEMIDE 20 MG PO TABS
40.0000 mg | ORAL_TABLET | Freq: Once | ORAL | Status: AC
  Administered 2013-05-20: 40 mg via ORAL

## 2013-05-20 NOTE — Patient Instructions (Signed)
Blood Products Information This is information about transfusions of blood products. All blood that is to be transfused is tested for blood type, compatibility with the recipient, and for infections. Except in emergencies, giving a transfusion requires a written consent. Blood transfusions are often given as packed red blood cells. This means the other parts of the blood have been taken out. Blood may be needed to treat severe anemia or bleeding. Other blood products include plasma, platelets, immune globulin, and cryoprecipitate. Blood for transfusion is mostly donated by volunteers. The blood donors are carefully screened for risk factors that could cause disease. Donors are all tested for infections that could be transmitted by blood. The blood product supply today is the safest it has ever been. Some risks do remain.  A minor reaction with fever, chills, or rash happens in about 1% of blood product transfusions.  Life-threatening reactions occur in less than 1 in a million transfusions.  Infection with germs (bacteria), viruses or parasites like malaria can still happen. The risk is very low.  Hepatitis B occurs in about 1 case in 150,000 transfusions.  Hepatitis C is seen once in 500,000.  HIV is transmitted less than once every million transfusions. When you receive a transfusion of packed red blood cells, your blood is tested for blood group and Rh type. Your blood is also screened for antibodies that could cause a serious reaction. A cross-match test is done to make sure the blood is safe to give.  Talk with your caregiver if you have any concerns about receiving a transfusion of blood products. Make sure your questions are answered. Transfusions are not given if your caregiver feels the risk is greater than the need. Document Released: 07/09/2005 Document Revised: 10/01/2011 Document Reviewed: 12/27/2006 [facility] Patient Information 2014 [place].

## 2013-05-21 ENCOUNTER — Encounter: Payer: Self-pay | Admitting: *Deleted

## 2013-05-21 ENCOUNTER — Other Ambulatory Visit: Payer: Self-pay | Admitting: Internal Medicine

## 2013-05-21 LAB — TYPE AND SCREEN
ABO/RH(D): O POS
Antibody Screen: NEGATIVE

## 2013-05-22 ENCOUNTER — Ambulatory Visit (HOSPITAL_COMMUNITY): Payer: BC Managed Care – PPO

## 2013-05-22 ENCOUNTER — Ambulatory Visit: Payer: Self-pay

## 2013-05-23 ENCOUNTER — Other Ambulatory Visit: Payer: Self-pay | Admitting: Oncology

## 2013-05-23 DIAGNOSIS — D509 Iron deficiency anemia, unspecified: Secondary | ICD-10-CM

## 2013-05-25 ENCOUNTER — Ambulatory Visit (HOSPITAL_BASED_OUTPATIENT_CLINIC_OR_DEPARTMENT_OTHER): Payer: BC Managed Care – PPO | Admitting: Oncology

## 2013-05-25 ENCOUNTER — Ambulatory Visit (HOSPITAL_COMMUNITY): Payer: BC Managed Care – PPO

## 2013-05-25 ENCOUNTER — Encounter: Payer: Self-pay | Admitting: Cardiology

## 2013-05-25 ENCOUNTER — Telehealth: Payer: Self-pay | Admitting: *Deleted

## 2013-05-25 ENCOUNTER — Other Ambulatory Visit: Payer: Self-pay

## 2013-05-25 ENCOUNTER — Ambulatory Visit (HOSPITAL_BASED_OUTPATIENT_CLINIC_OR_DEPARTMENT_OTHER): Payer: BC Managed Care – PPO

## 2013-05-25 ENCOUNTER — Ambulatory Visit (INDEPENDENT_AMBULATORY_CARE_PROVIDER_SITE_OTHER): Payer: BC Managed Care – PPO | Admitting: Cardiology

## 2013-05-25 ENCOUNTER — Other Ambulatory Visit (HOSPITAL_BASED_OUTPATIENT_CLINIC_OR_DEPARTMENT_OTHER): Payer: BC Managed Care – PPO | Admitting: Lab

## 2013-05-25 VITALS — BP 128/72 | HR 78 | Ht 62.75 in | Wt 122.1 lb

## 2013-05-25 VITALS — BP 115/64 | HR 89 | Temp 97.4°F | Resp 20 | Ht 62.08 in | Wt 123.5 lb

## 2013-05-25 DIAGNOSIS — E785 Hyperlipidemia, unspecified: Secondary | ICD-10-CM

## 2013-05-25 DIAGNOSIS — D649 Anemia, unspecified: Secondary | ICD-10-CM

## 2013-05-25 DIAGNOSIS — I5032 Chronic diastolic (congestive) heart failure: Secondary | ICD-10-CM

## 2013-05-25 DIAGNOSIS — Z794 Long term (current) use of insulin: Secondary | ICD-10-CM

## 2013-05-25 DIAGNOSIS — E119 Type 2 diabetes mellitus without complications: Secondary | ICD-10-CM

## 2013-05-25 DIAGNOSIS — N289 Disorder of kidney and ureter, unspecified: Secondary | ICD-10-CM

## 2013-05-25 DIAGNOSIS — I25810 Atherosclerosis of coronary artery bypass graft(s) without angina pectoris: Secondary | ICD-10-CM

## 2013-05-25 DIAGNOSIS — I509 Heart failure, unspecified: Secondary | ICD-10-CM

## 2013-05-25 DIAGNOSIS — I350 Nonrheumatic aortic (valve) stenosis: Secondary | ICD-10-CM

## 2013-05-25 DIAGNOSIS — D509 Iron deficiency anemia, unspecified: Secondary | ICD-10-CM

## 2013-05-25 DIAGNOSIS — I2510 Atherosclerotic heart disease of native coronary artery without angina pectoris: Secondary | ICD-10-CM

## 2013-05-25 DIAGNOSIS — I359 Nonrheumatic aortic valve disorder, unspecified: Secondary | ICD-10-CM

## 2013-05-25 DIAGNOSIS — I5030 Unspecified diastolic (congestive) heart failure: Secondary | ICD-10-CM

## 2013-05-25 LAB — BASIC METABOLIC PANEL (CC13)
Anion Gap: 13 mEq/L — ABNORMAL HIGH (ref 3–11)
CO2: 27 mEq/L (ref 22–29)
Chloride: 103 mEq/L (ref 98–109)
Creatinine: 1.5 mg/dL — ABNORMAL HIGH (ref 0.6–1.1)
Glucose: 176 mg/dl — ABNORMAL HIGH (ref 70–140)
Potassium: 3.8 mEq/L (ref 3.5–5.1)
Sodium: 143 mEq/L (ref 136–145)

## 2013-05-25 LAB — CBC WITH DIFFERENTIAL/PLATELET
EOS%: 1.5 % (ref 0.0–7.0)
Eosinophils Absolute: 0.1 10*3/uL (ref 0.0–0.5)
HGB: 10.9 g/dL — ABNORMAL LOW (ref 11.6–15.9)
MONO#: 0.5 10*3/uL (ref 0.1–0.9)
MONO%: 7 % (ref 0.0–14.0)
NEUT#: 5.7 10*3/uL (ref 1.5–6.5)
RBC: 3.77 10*6/uL (ref 3.70–5.45)
RDW: 20.2 % — ABNORMAL HIGH (ref 11.2–14.5)
WBC: 7.4 10*3/uL (ref 3.9–10.3)
lymph#: 1 10*3/uL (ref 0.9–3.3)

## 2013-05-25 LAB — FECAL OCCULT BLOOD, GUAIAC: Occult Blood: POSITIVE

## 2013-05-25 MED ORDER — FERUMOXYTOL INJECTION 510 MG/17 ML
510.0000 mg | Freq: Once | INTRAVENOUS | Status: AC
  Administered 2013-05-25: 510 mg via INTRAVENOUS
  Filled 2013-05-25: qty 17

## 2013-05-25 MED ORDER — SODIUM CHLORIDE 0.9 % IV SOLN
INTRAVENOUS | Status: DC
  Administered 2013-05-25: 16:00:00 via INTRAVENOUS

## 2013-05-25 NOTE — Progress Notes (Signed)
Patient ID: [name] [name], female   DOB: [birth year], 62 y.o.   MRN: [id number] PCP: Dr. Orson Ape  62 yo with history of CAD s/p CABG in 12/05 and redo in 1/11, bioprosthetic AVR, diastolic CHF, and sick sinus syndrome s/p PCM presents for cardiology followup.  She had initial CABG with SVG-RCA in 12/05 followed by redo SVG-RCA and bioprosthetic AVR in 1/11.  Last echo in 9/14 showed normal EF with well-seated aortic valve, mild to moderate MR, and PA systolic pressure 51 mmHg.  She has had diastolic CHF and is on Lasix.    Prior to my last visit with her, she had been having daily chest tightness with exertion and severe exertional dyspnea.  In 9/14, she had a CPX test.  This showed severe functional limitation with ischemic ECG changes (inferolateral ST depression and ST elevation in V1 and V2). She had chest pain.  I took her for [facility] in 9/14.  This showed elevated left and right heart filling pressures, patent SVG-PDA, and 80% distal LM/80% ostial LAD stenosis.  Patient was deemed not a candidate for redo CABG (had 2 prior sternotomies as well as chest radiation for Hodgkins lymphoma).  She had DES from left main into proximal LAD and will need long-term DAPT => Brilinta was used as she has been a poor responder to Plavix in the past.    She has chronic iron deficiency anemia thought to be due to a slow GI bleed.  Recent EGD was unremarkable and a capsule endoscopy was also unremarkable.  She has periodic transfusions; she got 2 units last week.    She has an insulin pump followed by an endocrinologist at [facility].   Patient is on a very low dose of pravastatin.  She has been unable to tolerate multiple statins or higher pravastatin dose due to myalgias.    Currently, she seems to be doing well.  She feels much better after the transfusions last week.  She has been able to walk faster and has had no chest tightness for the last 3 days.  Even after the left main stent, she had been having almost daily  episodes of chest tightness with exertion.  Anemia is thought to be a significant role in this.  She had an episode of palpitations last night that lasted for 5-6 minutes.  This is the first time she has noted palpitations in a long time.  Weight is down 4 lbs.   ECG: NSR, LVH, nonspecific T wave changes.   Labs (7/14): hemoglobin 11 Labs (9/14): K 3.9, creatinine 1.48, hemoglobin 7.3 Labs (10/14): hemoglobin 7.7  PMH: 1. Sick sinus syndrome s/p [employer] PCM.  2. HTN 3. H/o SBO 4. Renal cell carcinoma s/p right nephrectomy in 2008.  5. NASH 6. Diastolic CHF: Echo (4/40) with EF 60-65%, bioprosthetic aortic valve with mean gradient 15 mmHg, mild MR.  CPX (9/14): peak VO2 10.4, VE/VCO2 43.8, inferolateral ST depression and ST elevation in V1/V2 with severe dyspnea and chest pain, severe functional limitation with ischemic changes.  Echo (9/14) with EF 60-65%, normal RV size and systolic function, mild-moderate MR, bioprosthetic aortic valve normal, PA systolic pressure 51 mmHg, no evidence for pericardial constriction though exam incomplete for constriction.  7. TAH-BSO 8. Type II diabetes: Has insulin pump.  9. H/o ischemic colitis. 10. H/o PUD. 11. Diabetic gastroparesis. 12. Hyperlipidemia: Myalgias with Crestor and atorvastatin, elevated LFTs with simvastatin.  Myalgias with > 10 mg pravastatin.  13. CKD 14. Bicuspid aortic valve s/p 19  mm Edwards pericardial valve in 1/11 (had post-operative Dresslers syndrome).   15. CAD: CABG with SVG-RCA in 12/05.  Redo SVG-RCA in 1/11 with AVR.  LHC/RHC (9/14) with mean RA 18, PA 62/25 mean 43, mean PCWP 26, CI 2.8, 70-80% distal LM stenosis, 80% ostial LAD stenosis, total occlusion RCA, SVG-PDA patent.  Patient had DES LM into LAD.  Plan to continue long-term ASA/Brilinta (poor responder to Plavix).  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  17. Anemia: Iron-deficiency.  Suspect chronic GI blood loss.  EGD in  9/14 was unremarkable.  Capsule endoscopy 10/14 was unremarkable.  Has required periodic blood transfusions.   SH: Married, lives in [place], nonsmoker.   FH: CAD  ROS: All systems reviewed and negative except as per HPI.   Current Outpatient Prescriptions  Medication Sig Dispense Refill  . ALPRAZolam (XANAX) 0.5 MG tablet Take 0.25 mg by mouth at bedtime as needed for sleep.      Marland Kitchen aspirin EC 81 MG tablet Take 81 mg by mouth at bedtime.      . Calcium Carbonate-Vitamin D (RA CALCIUM PLUS VITAMIN D) 600-400 MG-UNIT per tablet Take 1 tablet by mouth 2 (two) times daily.       . cholecalciferol (VITAMIN D) 1000 UNITS tablet Take 1,000 Units by mouth 2 (two) times daily.       Marland Kitchen desoximetasone (TOPICORT) 0.05 % cream Apply 1 application topically 2 (two) times daily as needed. When skin starts peeling      . docusate sodium (COLACE) 100 MG capsule Take 1 capsule (100 mg total) by mouth 2 (two) times daily.  60 capsule  3  . ezetimibe (ZETIA) 10 MG tablet Take 10 mg by mouth at bedtime.      . fenofibrate (TRICOR) 145 MG tablet Take 145 mg by mouth at bedtime.      . ferrous fumarate (HEMOCYTE - 106 MG FE) 325 (106 FE) MG TABS tablet Take 1 tablet by mouth daily. Take with Vitamin C tablet      . fluticasone (FLONASE) 50 MCG/ACT nasal spray Place 2 sprays into the nose daily as needed for rhinitis. For nasal congestion      . furosemide (LASIX) 40 MG tablet Take 1.5 tablets (60 mg total) by mouth daily.  90 tablet  11  . glucagon (GLUCAGON EMERGENCY) 1 MG injection Inject 1 mg into the vein once as needed (for severe reaction).      Marland Kitchen HM OMEPRAZOLE 20 MG TBEC TAKE ONE TABLET BY MOUTH TWICE A DAY  60 each  5  . Insulin Human (INSULIN PUMP) 100 unit/ml SOLN Inject into the skin continuous. Apidra Basal Rate: Midnight-1100=2.5 1100-1300=4.2 1300-1700=4.3 1700-midnight=4.5      . isosorbide mononitrate (IMDUR) 30 MG 24 hr tablet Take 1 tablet (30 mg total) by mouth at bedtime.      .  Magnesium 250 MG TABS Take 250 mg by mouth daily.       . metoprolol (LOPRESSOR) 50 MG tablet Take 25-50 mg by mouth 2 (two) times daily. Takes 71m in the am & 521min the pm      . Multiple Vitamins-Minerals (MULTIVITAMIN WITH MINERALS) tablet Take 1 tablet by mouth daily.        . nitroGLYCERIN (NITROLINGUAL) 0.4 MG/SPRAY spray Place 1 spray under the tongue every 5 (five) minutes as needed for chest pain.      . Marland Kitchenmeprazole (PRILOSEC OTC) 20 MG tablet Take 20 mg by mouth daily.      .Marland Kitchen  polyethylene glycol (MIRALAX / GLYCOLAX) packet Take 8.5 g by mouth daily as needed. For constipation      . potassium chloride SA (K-DUR,KLOR-CON) 20 MEQ tablet Take 40 mEq by mouth daily.      . pravastatin (PRAVACHOL) 10 MG tablet Take 10 mg by mouth at bedtime.      . Ticagrelor (BRILINTA) 90 MG TABS tablet Take 1 tablet (90 mg total) by mouth 2 (two) times daily.  60 tablet  6  . vitamin C (ASCORBIC ACID) 500 MG tablet Take 250 mg by mouth daily.       . APIDRA 100 UNIT/ML injection Take 150-180 units daily (pump)       No current facility-administered medications for this visit.    BP 128/72  Pulse 78  Ht 5' 2.75" (1.594 m)  Wt 55.393 kg (122 lb 1.9 oz)  BMI 21.80 kg/m2  SpO2 99% General: NAD Neck: JVP 7 cm, no thyromegaly or thyroid nodule.  Lungs: Clear to auscultation bilaterally with normal respiratory effort. CV: Nondisplaced PMI.  Heart regular S1/S2, no C3/J6, 3/6 systolic murmur RUSB.  No peripheral edema.  No carotid bruit.  2+ PT pulses bilaterally.  Abdomen: Soft, nontender, no hepatosplenomegaly, no distention.  Skin: Intact without lesions or rashes.  Neurologic: Alert and oriented x 3.  Psych: Normal affect. Extremities: No clubbing or cyanosis.   Assessment/Plan: 1. CAD: s/p SVG-RCA in 12/05, then redo SVG-RCA in 1/11.  She was found to have distal left main/proximal LAD severe stenosis in 9/14.  She was very symptomatic.  PCI with DES to distal LM/proximal LAD was done rather  than bypass as she would have been a poor candidate for redo sternotomy (2 prior sternotomies and history of chest wall radiation for Hodgkins disease).  She will need to be on Brilinta and ASA (poor responder to plavix) long-term.  This is complicated by suspected slow GI bleed. So far, this has been controlled with periodic transfusions. Currently, when anemia is controlled, she does not have significant chest pain.  Continue ASA 81, statin, imdur, metoprolol, Brilinta.  I will refer her for cardiac rehab at [facility].  2. Bioprosthetic aortic valve replacement: Stable on last echo.  3. Diastolic CHF: Elevated left and right heart filling pressures on RHC prior to recent coronary intervention.  Today, she does not appear significantly volume overloaded, and she has minimal dyspnea.  Symptoms tend to flare when she is anemic.  - Continue current Lasix dosing.  4. CKD: Last creatinine was stable.  Continue current Lasix dosing.   5. Hyperlipidemia: She has not been able to tolerate any higher potency statin than pravastatin 10 mg daily.  She is on Zetia.  6. Anemia: Suspected slow GI bleed.  Recent unremarkable EGD and capsule endoscopy.  Unfortunately, needs to stay on Brilinta.  Will need to follow closely (sees hematology), plan for transfusion with hemoglobin < 8 as this allows best control of her symptoms.   Followup in 3 months.    [name] 05/25/2013 1:34 PM

## 2013-05-25 NOTE — Patient Instructions (Signed)
You have been referred to Cardiac Rehab at [facility] wants you to follow-up in: 3 months with [name]. (February 2015). You will receive a reminder letter in the mail two months in advance. If you don't receive a letter, please call our office to schedule the follow-up appointment.

## 2013-05-25 NOTE — Telephone Encounter (Signed)
appts made and printed...td 

## 2013-05-25 NOTE — Patient Instructions (Signed)
Ferumoxytol injection What is this medicine? FERUMOXYTOL is an iron complex. Iron is used to make healthy red blood cells, which carry oxygen and nutrients throughout the body. This medicine is used to treat iron deficiency anemia in people with chronic kidney disease. This medicine may be used for other purposes; ask your health care provider or pharmacist if you have questions. What should I tell my health care provider before I take this medicine? They need to know if you have any of these conditions: -anemia not caused by low iron levels -high levels of iron in the blood -magnetic resonance imaging (MRI) test scheduled -an unusual or allergic reaction to iron, other medicines, foods, dyes, or preservatives -pregnant or trying to get pregnant -breast-feeding How should I use this medicine? This medicine is for infusion into a vein. It is given by a health care professional in a hospital or clinic setting. Talk to your pediatrician regarding the use of this medicine in children. Special care may be needed. Overdosage: If you think you've taken too much of this medicine contact a poison control center or emergency room at once. Overdosage: If you think you have taken too much of this medicine contact a poison control center or emergency room at once. NOTE: This medicine is only for you. Do not share this medicine with others. What if I miss a dose? It is important not to miss your dose. Call your doctor or health care professional if you are unable to keep an appointment. What may interact with this medicine? This medicine may interact with the following medications: -other iron products This list may not describe all possible interactions. Give your health care provider a list of all the medicines, herbs, non-prescription drugs, or dietary supplements you use. Also tell them if you smoke, drink alcohol, or use illegal drugs. Some items may interact with your medicine. What should I watch  for while using this medicine? Visit your doctor or healthcare professional regularly. Tell your doctor or healthcare professional if your symptoms do not start to get better or if they get worse. You may need blood work done while you are taking this medicine. You may need to follow a special diet. Talk to your doctor. Foods that contain iron include: whole grains/cereals, dried fruits, beans, or peas, leafy green vegetables, and organ meats (liver, kidney). What side effects may I notice from receiving this medicine? Side effects that you should report to your doctor or health care professional as soon as possible: -allergic reactions like skin rash, itching or hives, swelling of the face, lips, or tongue -breathing problems -changes in blood pressure -feeling faint or lightheaded, falls -fever or chills -flushing, sweating, or hot feelings -swelling of the ankles or feet Side effects that usually do not require medical attention (Report these to your doctor or health care professional if they continue or are bothersome.): -diarrhea -headache -nausea, vomiting -stomach pain This list may not describe all possible side effects. Call your doctor for medical advice about side effects. You may report side effects to FDA at 1-800-FDA-1088. Where should I keep my medicine? This drug is given in a hospital or clinic and will not be stored at home. NOTE: This sheet is a summary. It may not cover all possible information. If you have questions about this medicine, talk to your doctor, pharmacist, or health care provider.  2013, Elsevier/Gold Standard. (03/31/2008 9:48:25 PM)

## 2013-05-26 ENCOUNTER — Encounter: Payer: Self-pay | Admitting: Oncology

## 2013-05-26 NOTE — Progress Notes (Signed)
OFFICE PROGRESS NOTE  05/25/2013  Physicians:W.McGough, [name] (endocrine [facility]), [name], D.McLean, G.Lovena Le, B.Bartle   INTERVAL HISTORY:  Patient is seen, alone for visit, in follow up of anemia, which has been significantly worse since at least Sept 2014, previously hemoglobins ~ 11-11.6 thru early July 2014. She was hospitalized with cardiac symptoms when hemoglobin was 7.3 in early Sept, heme + stools then and transfused 1 unit PRBCs. Hgb was 9.6 when I saw her at [facility] on 04-27-13, then down to 7.7 on 05-18-13 after large dark bowel movements. She was clearly more symptomatic with 7.7 Hgb and was transfused 2 units of PRBCs, one each on10-28 and 10-29  over 4 hours with additional lasix. She could tell significant improvement by 05-22-13 "I have not had this much energy in months", able to help with church activity for children on Halloween.  Iron studies are low and she does not have evidence of hemolysis. Erythropoietin level last week was elevated at 58 and urinalysis was negative for blood.  Hemoccult cards returned today are + for occult blood x3. She has not had further of the large dark stools. Note capsule small bowel endoscopy in October was unremarkable.   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 She has radiation induced hypothyroidism and the treatment caused or worsened coronary artery disease. Oncologic history is also remarkable for right nephrectomy for renal cell carcinoma 2008 (T1bNx chromophobe variant Fuhrman Nuclear Grade III-IV with negative margins, no renal vein invasion, no extension thru capsule at right nephrectomy 06-11-2007 by [name], path [id number]). She is no longer followed regularly by urology.   Significant comorbid conditions include ACD with prior CABG then redo CABG, aortic stenosis post aortic valve 2011, SSS  with pacer, insulin dependent diabetes now followed by Dr [name] at [facility] and on insulin pump, hypothyroidism related to RT for Hodgkins, diagnosis of ischemic colitis with rectal bleeding during past year, and some renal insufficiency with single kidney.Patient was admitted to [facility] from 04-03-13 thru 04-11-13 with progressive dyspnea, orthopnea, unstable angina and Hgb of 7.3 on day of admission, this following outpatient evaluation with cardiopulmonary exercise test with evidence of ischemia. She ruled out for MI. Stool was heme + by GI in hospital; she was transfused 1 unit of PRBCs on 04-03-13, given over 4 hours and note from those MDs that she should have lasix with further transfusions. She began oral iron, did not tolerate this bid.. EGD 04-06-13 was unremarkable including duodenal biopsy. She had cardiac cath on 04-07-13 with high grade LM and proximal LAD involvement, and diffuse calcific plaque in aorta. CVTS (Dr Cyndia Bent) felt that she was not a candidate for a 3rd re-do surgical procedure particularly due to diffuse aortic calcification. She had PTCA of left main with stent into ostium of LM into LAD by Dr Angelena Form on 04-09-13; per cardiology she will need lifelong dual antiplatelet therapy.      Review of systems as above, also: No chest pain or increased SOB. No LE swelling. Blood sugars somewhat less erratic on new, tightly restricted carb intake. No fever or symptoms of infection. Only other bleeding is intermittent, very minimal blood on tissue when blows nose, not epistaxis to point of swallowing blood. Remainder of 10 point Review of Systems negative.  Objective:  Vital signs in last 24 hours:  BP 115/64  Pulse 89  Temp(Src) 97.4 F (36.3 C) (  Oral)  Resp 20  Ht 5' 2.08" (1.577 m)  Wt 123 lb 8 oz (56.019 kg)  BMI 22.53 kg/m2 Weight is down 1 lb from 05-18-13. Looks brighter and more comfortable today, the best that I have seen her in several months. Alert, oriented and  appropriate. Ambulatory without difficulty.  Respirations not labored RA.  HEENT:PERRL, sclerae not icteric. Oral mucosa moist without lesions, posterior pharynx clear.  Neck supple. No JVD.  Lymphatics:no cervical,suraclavicular adenopathy Resp: clear to auscultation bilaterally Cardio: regular rate and rhythm. No gallop. GI: soft, nontender, not distended Normally active bowel sounds. Insulin pump in place Musculoskeletal/ Extremities: without pitting edema, cords, tenderness Neuro: nonfocal. Psych as above. Skin without rash, ecchymosis, petechiae  Lab Results:  Results for orders placed in visit on 05/25/13  FECAL OCCULT BLOOD, GUAIAC      Result Value Range   Occult Blood Positive x3    CBC WITH DIFFERENTIAL      Result Value Range   WBC 7.4  3.9 - 10.3 10e3/uL   NEUT# 5.7  1.5 - 6.5 10e3/uL   HGB 10.9 (*) 11.6 - 15.9 g/dL   HCT 33.5 (*) 34.8 - 46.6 %   Platelets 162  145 - 400 10e3/uL   MCV 88.9  79.5 - 101.0 fL   MCH 28.9  25.1 - 34.0 pg   MCHC 32.6  31.5 - 36.0 g/dL   RBC 3.77  3.70 - 5.45 10e6/uL   RDW 20.2 (*) 11.2 - 14.5 %   lymph# 1.0  0.9 - 3.3 10e3/uL   MONO# 0.5  0.1 - 0.9 10e3/uL   Eosinophils Absolute 0.1  0.0 - 0.5 10e3/uL   Basophils Absolute 0.1  0.0 - 0.1 10e3/uL   NEUT% 77.3 (*) 38.4 - 76.8 %   LYMPH% 13.1 (*) 14.0 - 49.7 %   MONO% 7.0  0.0 - 14.0 %   EOS% 1.5  0.0 - 7.0 %   BASO% 1.1  0.0 - 2.0 %  BASIC METABOLIC PANEL (RK27)      Result Value Range   Sodium 143  136 - 145 mEq/L   Potassium 3.8  3.5 - 5.1 mEq/L   Chloride 103  98 - 109 mEq/L   CO2 27  22 - 29 mEq/L   Glucose 176 (*) 70 - 140 mg/dl   BUN 43.5 (*) 7.0 - 26.0 mg/dL   Creatinine 1.5 (*) 0.6 - 1.1 mg/dL   Calcium 10.2  8.4 - 10.4 mg/dL   Anion Gap 13 (*) 3 - 11 mEq/L     Studies/Results:  No results found.  Medications: I have reviewed the patient's current medications. We have discussed IV feraheme including rare but possible allergic reactions; she does want to proceed  today, 510 mg given after visit without difficulty. I do not plan a second dose at this point, based on iron levels at low normal by last labs. She will DC oral iron after IV feraheme done.  DISCUSSION: I have spoken directly with Dr [name] following patient's visit today, due to complexity of her medical problems and need to coordinate care. I will follow CBCs at this office, next in ~ 3 weeks or sooner if recurrent symptoms of anemia or concern about further GI bleeding. I will let Dr [name] know that hemoccult cards are again + x3 this week. Note cardiology needs her to continue Brilinta and 81 mg ASA indefinitely due to CAD and left main/LAD stent.  Assessment/Plan: 1.Recent severe anemia in Sept and  again in Oct: iron deficient by labs Sept, apparently with at least intermittent GI bleeding tho negative capsule upper endoscopy. Hgb much improved with 2 units PRBCs given carefully over 2 days last week; IV feraheme 510 mg dose given today (do not plan second dose now).  2.IIA nodular sclerosing Hodgkins treated with mantle radiation in 1980s.  3.post right nephrectomy for renal cell carcinoma 2008, not known recurrent.UA without blood.  4.Cardiac disease: CAD post RCA vein graft and redo CABG 2011, aortic stenosis post aortic valve 2011, sinus node dysfunction with brief episodes PAF, dual chamber pacer Jan 2014. Now post stent to LM into proximal LAD for progressive involvement there, not candidate for another surgery per CVTS.  5. Insulin dependent diabetes: now followed by Dr [name] at [facility], with consideration of insulin allergy. On insulin pump.  6.hypothyroidism post RT, on replacement, TFTs ok Sept  7.rectal bleeding ~ 12-2012 thought from ischemic colitis. Post colonoscopy 06-2012 with no polyps.  8.renal insufficiency: single kidney, creatinines 1.3 - 1.4 in past month, slightly higher at 1.5 today.    Patient has had questions answered to her satisfaction and is in agreement  with plan above.    [name], MD

## 2013-05-27 ENCOUNTER — Ambulatory Visit: Payer: BC Managed Care – PPO

## 2013-05-27 ENCOUNTER — Ambulatory Visit (HOSPITAL_COMMUNITY): Payer: BC Managed Care – PPO

## 2013-05-27 ENCOUNTER — Telehealth: Payer: Self-pay | Admitting: Oncology

## 2013-05-27 NOTE — Telephone Encounter (Signed)
pt called to r/s lab....done

## 2013-05-29 ENCOUNTER — Ambulatory Visit (HOSPITAL_COMMUNITY): Payer: BC Managed Care – PPO

## 2013-06-01 ENCOUNTER — Ambulatory Visit (HOSPITAL_COMMUNITY): Payer: BC Managed Care – PPO

## 2013-06-03 ENCOUNTER — Ambulatory Visit (HOSPITAL_COMMUNITY): Payer: BC Managed Care – PPO

## 2013-06-05 ENCOUNTER — Other Ambulatory Visit: Payer: Self-pay | Admitting: Cardiology

## 2013-06-05 ENCOUNTER — Ambulatory Visit (HOSPITAL_COMMUNITY): Payer: BC Managed Care – PPO

## 2013-06-08 ENCOUNTER — Ambulatory Visit (HOSPITAL_COMMUNITY): Payer: BC Managed Care – PPO

## 2013-06-09 ENCOUNTER — Other Ambulatory Visit: Payer: Self-pay | Admitting: Cardiology

## 2013-06-09 DIAGNOSIS — R110 Nausea: Secondary | ICD-10-CM | POA: Insufficient documentation

## 2013-06-09 DIAGNOSIS — R140 Abdominal distension (gaseous): Secondary | ICD-10-CM | POA: Insufficient documentation

## 2013-06-10 ENCOUNTER — Ambulatory Visit (HOSPITAL_COMMUNITY): Payer: BC Managed Care – PPO

## 2013-06-11 ENCOUNTER — Telehealth: Payer: Self-pay | Admitting: Internal Medicine

## 2013-06-11 ENCOUNTER — Ambulatory Visit (INDEPENDENT_AMBULATORY_CARE_PROVIDER_SITE_OTHER): Payer: BC Managed Care – PPO | Admitting: *Deleted

## 2013-06-11 DIAGNOSIS — I4891 Unspecified atrial fibrillation: Secondary | ICD-10-CM

## 2013-06-11 NOTE — Telephone Encounter (Signed)
Pt will come in this afternoon to [facility] for a quick device check. Her husband will drive her due to dizziness symptoms.

## 2013-06-11 NOTE — Telephone Encounter (Signed)
Spoke with patient. Pt states she has been in a fib since at least 9AM. She denies lightheadedness or dizziness, but feels her heart rate alternating between fast and slow. I have talked with EP device staff to try to arrange interrogation of her device.

## 2013-06-11 NOTE — Telephone Encounter (Signed)
New Problem:  Pt states she has been having a-fib for about 30 mins and states she is feeling weak. Pt would like to be advised

## 2013-06-11 NOTE — Progress Notes (Signed)
Pt came in to check device for symptomatic Afib episodes between 9:00-10:30 this morning. Device did see 2 Afib episodes in that timeframe totaling over 50mnutes. Pt not on anti-coagulants.   I will review with Dr. MAundra Dubin& Dr. TLovena Le

## 2013-06-12 ENCOUNTER — Ambulatory Visit (HOSPITAL_BASED_OUTPATIENT_CLINIC_OR_DEPARTMENT_OTHER): Payer: BC Managed Care – PPO

## 2013-06-12 ENCOUNTER — Ambulatory Visit (HOSPITAL_COMMUNITY): Payer: BC Managed Care – PPO

## 2013-06-12 DIAGNOSIS — D509 Iron deficiency anemia, unspecified: Secondary | ICD-10-CM

## 2013-06-12 LAB — CBC WITH DIFFERENTIAL/PLATELET
BASO%: 1.2 % (ref 0.0–2.0)
Basophils Absolute: 0.1 10*3/uL (ref 0.0–0.1)
EOS%: 2.5 % (ref 0.0–7.0)
LYMPH%: 13.9 % — ABNORMAL LOW (ref 14.0–49.7)
MCH: 29.1 pg (ref 25.1–34.0)
MCV: 89.4 fL (ref 79.5–101.0)
MONO%: 5 % (ref 0.0–14.0)
RBC: 4.27 10*6/uL (ref 3.70–5.45)
RDW: 17.5 % — ABNORMAL HIGH (ref 11.2–14.5)

## 2013-06-15 ENCOUNTER — Other Ambulatory Visit: Payer: BC Managed Care – PPO | Admitting: Lab

## 2013-06-15 ENCOUNTER — Telehealth: Payer: Self-pay

## 2013-06-15 ENCOUNTER — Ambulatory Visit (HOSPITAL_COMMUNITY): Payer: BC Managed Care – PPO

## 2013-06-15 NOTE — Telephone Encounter (Signed)
Told Ms. Derderian the lab results as noted below. Ms. Istre stats that Dr. Gerald Dexter (GI) at [facility] did a Fibro Scan and her number were at the top of the High scale.  He scheduled an ultrasound of the liver to R/O cancer or cirrhosis.  Ms. Amara stated that Dr. Linus Orn said that people can experience oozing of blood from the liver when the numbers are as high as her's. This scan will be after visit with Dr. Marko Plume in December.

## 2013-06-15 NOTE — Telephone Encounter (Signed)
Patient advised She will call back if she decides she wants to have testing done, but at this time she is not interested either.

## 2013-06-15 NOTE — Telephone Encounter (Signed)
Message copied by Baruch Merl on Mon Jun 15, 2013  2:53 PM ------      Message from: [name]      Created: Sat Jun 13, 2013  8:43 AM       Labs seen and need follow up: please let her know hemoglobin was 12.4 since the iron ------

## 2013-06-15 NOTE — Telephone Encounter (Signed)
Message copied by Marlon Pel on Mon Jun 15, 2013  2:00 PM ------      Message from: Silvano Rusk E      Created: Mon Jun 15, 2013  1:44 PM      Regarding: heme +       Please let her know I am aware Dr. Marko Plume found her to be heme +      Doubt I would pursue further testing at this time as long as her Hgb holds with iron      We can sit down and discuss in non-urgent REV if she wants ------

## 2013-06-17 ENCOUNTER — Ambulatory Visit (HOSPITAL_COMMUNITY): Payer: BC Managed Care – PPO

## 2013-06-22 ENCOUNTER — Ambulatory Visit (HOSPITAL_COMMUNITY): Payer: BC Managed Care – PPO

## 2013-06-22 ENCOUNTER — Encounter: Payer: Self-pay | Admitting: Nurse Practitioner

## 2013-06-23 ENCOUNTER — Encounter (HOSPITAL_COMMUNITY)
Admission: RE | Admit: 2013-06-23 | Discharge: 2013-06-23 | Disposition: A | Discharge status: Home / Self Care | Payer: BC Managed Care – PPO | Source: Ambulatory Visit | Attending: Cardiology | Admitting: Cardiology

## 2013-06-23 ENCOUNTER — Telehealth: Payer: Self-pay | Admitting: Nurse Practitioner

## 2013-06-23 VITALS — BP 120/62 | HR 72 | Ht 62.0 in | Wt 125.4 lb

## 2013-06-23 DIAGNOSIS — Z9861 Coronary angioplasty status: Secondary | ICD-10-CM

## 2013-06-23 DIAGNOSIS — I2510 Atherosclerotic heart disease of native coronary artery without angina pectoris: Secondary | ICD-10-CM | POA: Insufficient documentation

## 2013-06-23 DIAGNOSIS — Z5189 Encounter for other specified aftercare: Secondary | ICD-10-CM | POA: Insufficient documentation

## 2013-06-23 NOTE — Progress Notes (Signed)
Patient referred to CR by Dr. [name] due to CAD, Stent Placement V45.82. During orientation advised patient on arrival and appointment times what to wear, what to do before, during and after exercise. Reviewed attendance and class policy. Talked about inclement weather and class consultation policy. Pt is scheduled to start Cardiac Rehab on _12/8/14 at 9:30 am. Pt was advised to come to class 5 minutes before class starts. He was also given instructions on meeting with the dietician and attending the Family Structure classes. Pt is eager to get started. Patient will do six minute walk test on 1st day of exercise.

## 2013-06-23 NOTE — Telephone Encounter (Signed)
Per my office note, after discussion with the pharmacist - Azithromycin (Zithromax) 500 mg po 1 hour prior to procedure.   May have 2 refills.

## 2013-06-23 NOTE — Telephone Encounter (Signed)
New Problem:  Pt states she is going to have a routine dental cleaning tomorrow and wants to know what medication should be called in for her prior to this appt. Pt states her medication can be called in [employer].

## 2013-06-23 NOTE — Telephone Encounter (Signed)
lmovm only because pt's name was stated. I explained that pt will take 1 tablet ( 500 mg) Azithromycin ( Zithromax)  1 hour prior to dental procedure

## 2013-06-23 NOTE — Patient Instructions (Signed)
Pt has finished orientation and is scheduled to start CR on 12//8/14 at 9:30. Pt has been instructed to arrive to class 15 minutes early for scheduled class. Pt has been instructed to wear comfortable clothing and shoes with rubber soles. Pt has been told to take their medications 1 hour prior to coming to class.  If the patient is not going to attend class, he/she has been instructed to call.

## 2013-06-24 ENCOUNTER — Telehealth: Payer: Self-pay | Admitting: Cardiology

## 2013-06-24 ENCOUNTER — Ambulatory Visit (HOSPITAL_COMMUNITY): Payer: BC Managed Care – PPO

## 2013-06-24 NOTE — Telephone Encounter (Signed)
Pt.notified

## 2013-06-24 NOTE — Telephone Encounter (Signed)
It may take a while on Brilinta.  She can hold one dose tonight and resume in am.

## 2013-06-24 NOTE — Telephone Encounter (Signed)
New message    Had dental cleaning this am at 10am---pt is on berlinta----pts gums are oozing blood and has not stopped since cleaning.  Pls advise

## 2013-06-26 ENCOUNTER — Ambulatory Visit (HOSPITAL_COMMUNITY): Payer: BC Managed Care – PPO

## 2013-06-29 ENCOUNTER — Encounter (HOSPITAL_COMMUNITY)
Admission: RE | Admit: 2013-06-29 | Discharge: 2013-06-29 | Disposition: A | Discharge status: Home / Self Care | Payer: BC Managed Care – PPO | Source: Ambulatory Visit | Attending: Cardiology | Admitting: Cardiology

## 2013-06-29 ENCOUNTER — Ambulatory Visit (HOSPITAL_COMMUNITY): Payer: BC Managed Care – PPO

## 2013-07-01 ENCOUNTER — Ambulatory Visit (HOSPITAL_COMMUNITY): Payer: BC Managed Care – PPO

## 2013-07-01 ENCOUNTER — Encounter (HOSPITAL_COMMUNITY)
Admission: RE | Admit: 2013-07-01 | Discharge: 2013-07-01 | Disposition: A | Discharge status: Home / Self Care | Payer: BC Managed Care – PPO | Source: Ambulatory Visit | Attending: Cardiology | Admitting: Cardiology

## 2013-07-01 ENCOUNTER — Encounter: Payer: Self-pay | Admitting: Internal Medicine

## 2013-07-03 ENCOUNTER — Ambulatory Visit (HOSPITAL_COMMUNITY): Payer: BC Managed Care – PPO

## 2013-07-03 ENCOUNTER — Encounter (HOSPITAL_COMMUNITY)
Admission: RE | Admit: 2013-07-03 | Discharge: 2013-07-03 | Disposition: A | Discharge status: Home / Self Care | Payer: BC Managed Care – PPO | Source: Ambulatory Visit | Attending: Cardiology | Admitting: Cardiology

## 2013-07-06 ENCOUNTER — Other Ambulatory Visit: Payer: Self-pay

## 2013-07-06 ENCOUNTER — Encounter (HOSPITAL_COMMUNITY)
Admission: RE | Admit: 2013-07-06 | Discharge: 2013-07-06 | Disposition: A | Discharge status: Home / Self Care | Payer: BC Managed Care – PPO | Source: Ambulatory Visit | Attending: Cardiology | Admitting: Cardiology

## 2013-07-06 ENCOUNTER — Other Ambulatory Visit (HOSPITAL_BASED_OUTPATIENT_CLINIC_OR_DEPARTMENT_OTHER): Payer: BC Managed Care – PPO

## 2013-07-06 ENCOUNTER — Encounter: Payer: Self-pay | Admitting: Oncology

## 2013-07-06 ENCOUNTER — Other Ambulatory Visit: Payer: Self-pay | Admitting: Cardiology

## 2013-07-06 ENCOUNTER — Ambulatory Visit (HOSPITAL_COMMUNITY): Payer: BC Managed Care – PPO

## 2013-07-06 ENCOUNTER — Ambulatory Visit (HOSPITAL_BASED_OUTPATIENT_CLINIC_OR_DEPARTMENT_OTHER): Payer: BC Managed Care – PPO | Admitting: Oncology

## 2013-07-06 ENCOUNTER — Telehealth: Payer: Self-pay | Admitting: Cardiology

## 2013-07-06 VITALS — BP 143/70 | HR 76 | Temp 97.8°F | Resp 18 | Ht 62.0 in | Wt 124.8 lb

## 2013-07-06 DIAGNOSIS — K625 Hemorrhage of anus and rectum: Secondary | ICD-10-CM

## 2013-07-06 DIAGNOSIS — E119 Type 2 diabetes mellitus without complications: Secondary | ICD-10-CM

## 2013-07-06 DIAGNOSIS — C649 Malignant neoplasm of unspecified kidney, except renal pelvis: Secondary | ICD-10-CM

## 2013-07-06 DIAGNOSIS — D509 Iron deficiency anemia, unspecified: Secondary | ICD-10-CM

## 2013-07-06 DIAGNOSIS — N289 Disorder of kidney and ureter, unspecified: Secondary | ICD-10-CM

## 2013-07-06 DIAGNOSIS — E041 Nontoxic single thyroid nodule: Secondary | ICD-10-CM

## 2013-07-06 DIAGNOSIS — Z87898 Personal history of other specified conditions: Secondary | ICD-10-CM

## 2013-07-06 LAB — CBC WITH DIFFERENTIAL/PLATELET
BASO%: 1.2 % (ref 0.0–2.0)
EOS%: 1.9 % (ref 0.0–7.0)
Eosinophils Absolute: 0.2 10*3/uL (ref 0.0–0.5)
HGB: 12.6 g/dL (ref 11.6–15.9)
MCHC: 33.6 g/dL (ref 31.5–36.0)
MCV: 89.5 fL (ref 79.5–101.0)
MONO#: 0.6 10*3/uL (ref 0.1–0.9)
MONO%: 6 % (ref 0.0–14.0)
NEUT#: 7.4 10*3/uL — ABNORMAL HIGH (ref 1.5–6.5)
RBC: 4.21 10*6/uL (ref 3.70–5.45)
RDW: 14.9 % — ABNORMAL HIGH (ref 11.2–14.5)
WBC: 9.3 10*3/uL (ref 3.9–10.3)

## 2013-07-06 MED ORDER — AMINOCAPROIC ACID 25 % PO SYRP: ORAL_SOLUTION | ORAL | Status: DC

## 2013-07-06 NOTE — Telephone Encounter (Signed)
PCI with DES done 9/14.  I will forward to [name] for review.

## 2013-07-06 NOTE — Telephone Encounter (Signed)
Pt states she had thyroid ultrasound 06/24/13--incidental finding was atherosclerotic changes of right common carotid artery, recommending dedicated ultrasound of carotid artery.

## 2013-07-06 NOTE — Telephone Encounter (Signed)
Has had recent carotid US.  Plaque but no severe stenosis.   Can fly with husband.    Would ideally stay on Brilinta for thyroid biopsy

## 2013-07-06 NOTE — Patient Instructions (Signed)
Prescription for Amicar solution will be sent to pharmacy, to use if bleeding gums after next dental procedure.  Ask [name] if you could use Afrin spray if significant nosebleed.

## 2013-07-06 NOTE — Telephone Encounter (Signed)
appts made and printed...td 

## 2013-07-06 NOTE — Telephone Encounter (Signed)
Other questions:  She may need a fine needle biopsy of thyroid nodule. Since she is on Brilinta what would  Dr Claris Gladden recommendation be about that.   She is having a nuclear medicine ultrasound of her liver tomorrow. Based on the findings of that test she may need fine needle biopsy of liver.   She would like to go on a trip by airplane with her husband to [place] in January. She is asking if this is OK with [name] and if there are any restrictions.

## 2013-07-06 NOTE — Telephone Encounter (Signed)
New Message  Pt had a thyroid scan that found Carotid issues// a doppler was recommended//Pt also states that questionable Nodules were found as well that may need a SNA// Pt asks if there are any restrictions to fly pt has a trip that is planned in January// Please call back to discuss further//

## 2013-07-06 NOTE — Telephone Encounter (Signed)
Discussed with patient

## 2013-07-06 NOTE — Progress Notes (Signed)
OFFICE PROGRESS NOTE   07/06/2013   Physicians:W.McGough, [name] (endocrine [facility]), [name], D.McLean, G.Lovena Le, B.Bartle, A.Deal (GI [facility])   INTERVAL HISTORY:  Patient is seen, alone for visit, in scheduled follow up of recent recurrent anemia for which she had 2 units PRBCs in late Oct and IV feraheme 510 mg on 05-25-13. She has felt stronger over the last several weeks, more active at home and looking forward to Christmas with grandchildren. She had bleeding gums continuously x 3 days after dental cleaning on 06-24-13 and does have small amounts of epistaxis usually daily. She continues Ticagrelor (Brilenta) + ASA 81 mg daily, which is necessary longterm due to CAD with left main/ LAD stent. She is willing to resume oral iron a few days weekly. She understands that stools will be heme + based on epistaxis or gums bleeding. She continues evaluation and close management by Drs Frederik Pear and Deal at [facility]. Thyroid US 06-24-13 at [facility] done due to increase in goiter shows multiple hypoechoic nodules as well as one solid nodule 1x1x0.6 cm on right, which Dr Frederik Pear would like biopsied. 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  She is concerned about an area in left breast.   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 She has radiation induced hypothyroidism and the treatment caused or worsened coronary artery disease. She has had no further active Hodgkins. Oncologic history is also remarkable for right nephrectomy for renal cell carcinoma 2008 (T1bNx chromophobe variant Fuhrman Nuclear Grade III-IV with negative margins, no renal vein invasion, no extension thru capsule at right nephrectomy 06-11-2007 by [name], path [id number]). She is no longer followed regularly by urology.  Significant comorbid  conditions include ACD with prior CABG then redo CABG, aortic stenosis post aortic valve 2011, SSS with pacer, insulin dependent diabetes now followed by Dr [name] at [facility] and on insulin pump, hypothyroidism related to RT for Hodgkins, diagnosis of ischemic colitis with rectal bleeding during past year, and some renal insufficiency with single kidney.Patient was admitted to [facility] from 04-03-13 thru 04-11-13 with progressive dyspnea, orthopnea, unstable angina and Hgb of 7.3 on day of admission, this following outpatient evaluation with cardiopulmonary exercise test with evidence of ischemia. She ruled out for MI. Stool was heme + by GI in hospital; she was transfused 1 unit of PRBCs on 04-03-13, given over 4 hours and note from those MDs that she should have lasix with further transfusions. She began oral iron, did not tolerate this bid.. EGD 04-06-13 was unremarkable including duodenal biopsy. She had cardiac cath on 04-07-13 with high grade LM and proximal LAD involvement, and diffuse calcific plaque in aorta. CVTS (Dr Cyndia Bent) felt that she was not a candidate for a 3rd re-do surgical procedure particularly due to diffuse aortic calcification. She had PTCA of left main with stent into ostium of LM into LAD by Dr Angelena Form on 04-09-13; per cardiology she will need lifelong dual antiplatelet therapy.    Review of systems as above, also: No fever or symptoms of infection. She began cardiac rehab this week, MWF, which already seems to be improving highest blood sugars, tho she has had 2 episodes of hypoglycemia. She will be due dental cleaning again in 6 mo. Remainder of 10 point Review of Systems negative.  Objective:  Vital signs in last 24 hours:  BP 143/70  Pulse  76  Temp(Src) 97.8 F (36.6 C) (Oral)  Resp 18  Ht 5' 2"  (1.575 m)  Wt 124 lb 12.8 oz (56.609 kg)  BMI 22.82 kg/m2  Alert, oriented and appropriate. Ambulatory without difficulty. Looks brighter and more comfortable, easily mobile in  exam room.   HEENT:PERRL, sclerae not icteric. Oral mucosa moist without lesions, posterior pharynx clear. High nasal septum on right reddened without bleeding, nothing visualized on left Neck supple. No JVD. Right lobe thyroid more prominent with multinodular configuration. Lymphatics:no cervical,suraclavicular, axillary adenopathy Resp: clear to auscultation bilaterally and normal percussion bilaterally Cardio: regular rate and rhythm. No gallop. GI: soft, nontender, not distended, no mass or organomegaly. Normally active bowel sounds. Insulin pump in place. Musculoskeletal/ Extremities: without pitting edema, cords, tenderness. Back not tender Neuro:  Nonfocal. Psych as above Skin without rash, ecchymosis, petechiae Breasts: without dominant mass, skin or nipple findings. Area questioned on left at 1:00 is ~ 1-70m smoothly rounded just under skin that seems to be cyst, slightly below tiny resolving excoriated area on skin.Inferiorly/ medially in left breast is stable slight irregularity consistent with fibrocystic changes Axillae benign.   Lab Results:  Results for orders placed in visit on 07/06/13  CBC WITH DIFFERENTIAL      Result Value Range   WBC 9.3  3.9 - 10.3 10e3/uL   NEUT# 7.4 (*) 1.5 - 6.5 10e3/uL   HGB 12.6  11.6 - 15.9 g/dL   HCT 37.7  34.8 - 46.6 %   Platelets 134 (*) 145 - 400 10e3/uL   MCV 89.5  79.5 - 101.0 fL   MCH 30.0  25.1 - 34.0 pg   MCHC 33.6  31.5 - 36.0 g/dL   RBC 4.21  3.70 - 5.45 10e6/uL   RDW 14.9 (*) 11.2 - 14.5 %   lymph# 1.1  0.9 - 3.3 10e3/uL   MONO# 0.6  0.1 - 0.9 10e3/uL   Eosinophils Absolute 0.2  0.0 - 0.5 10e3/uL   Basophils Absolute 0.1  0.0 - 0.1 10e3/uL   NEUT% 78.9 (*) 38.4 - 76.8 %   LYMPH% 12.0 (*) 14.0 - 49.7 %   MONO% 6.0  0.0 - 14.0 %   EOS% 1.9  0.0 - 7.0 %   BASO% 1.2  0.0 - 2.0 %    CBC reviewed with patient Studies/Results: [facility] with scattered fibroglandular tissue and no findings  of concern.  Medications: I have reviewed the patient's current medications. Oral amicar solution prescription pending as RN and pharmacists follow up on availability from suppliers. She will try oral iron a few times weekly if constipation not too bothersome, due to some bleeding and ongoing blood draws.  DISCUSSION: We have discussed evaluation of thyroid nodule by endocrinologist; copy of the [facility] be scanned into this EMR. We have discussed oral amicar solution (swish and spit) if gums bleed after further dental cleanings; due to cost and likely some difficulty obtaining this medication, we will send prescription to pharmacy now. Afrin nose spray may be helpful if significant epistaxis, however she needs to ask Dr MAundra Dubinif ok to use this from cardiac standpoint.   Assessment/Plan: 1.Recent severe anemia in Sept and again in Oct: iron deficient by labs Sept, apparently with at least intermittent GI bleeding tho negative capsule upper endoscopy. 2 units PRBCs given carefully over 2 days in late Oct; IV feraheme 510 mg dose given 05-25-13. Hemoglobin better today and symptomatically better. Will recheck in 3-4 mo if  not done elsewhere, or sooner if needed (if she is feeling very well by scheduled visit, she can call office to move out another 1-2 mo) 2.IIA nodular sclerosing Hodgkins treated with mantle radiation in 1980s.  3.post right nephrectomy for renal cell carcinoma 2008, not known recurrent.UA without blood.  4.Cardiac disease: CAD post RCA vein graft and redo CABG 2011, aortic stenosis post aortic valve 2011, sinus node dysfunction with brief episodes PAF, dual chamber pacer Jan 2014. Now post stent to LM into proximal LAD for progressive involvement there, not candidate for another surgery per CVTS. Needs continuous anticoagulation with Brilenta and ASA per cardiology. 5. Insulin dependent diabetes: now followed by Dr [name] at [facility], with consideration of insulin allergy vs  lipodystrophy. On insulin pump.  6.hypothyroidism post RT, on replacement. Progressive multinodular goiter with solid nodule on right, Dr Frederik Pear considering biopsy. Note anticoagulation per cardiology. 7.rectal bleeding ~ 12-2012 thought from ischemic colitis. Post colonoscopy 06-2012 with no polyps.  8.renal insufficiency: single kidney, creatinines 1.3 - 1.5 by last labs in this EMR. 9.thyroid nodules, at least one solid ~ 1 cm diameter. Per patient possible biopsy at [facility]. Note previous mantle radiation for Hodgkins 10. Lipodystrophy newly diagnosed at [facility] 11.no findings of concern left breast on my exam now, will follow. Patient aware that breast US available if further concerns.   Patient has had questions answered to her satisfaction and is in agreement with plan.   [name], MD   07/06/2013, 12:38 PM

## 2013-07-08 ENCOUNTER — Ambulatory Visit (HOSPITAL_COMMUNITY): Payer: BC Managed Care – PPO

## 2013-07-08 ENCOUNTER — Encounter (HOSPITAL_COMMUNITY)
Admission: RE | Admit: 2013-07-08 | Discharge: 2013-07-08 | Disposition: A | Discharge status: Home / Self Care | Payer: BC Managed Care – PPO | Source: Ambulatory Visit | Attending: Cardiology | Admitting: Cardiology

## 2013-07-10 ENCOUNTER — Ambulatory Visit (HOSPITAL_COMMUNITY): Payer: BC Managed Care – PPO

## 2013-07-10 ENCOUNTER — Encounter (HOSPITAL_COMMUNITY)
Admission: RE | Admit: 2013-07-10 | Discharge: 2013-07-10 | Disposition: A | Discharge status: Home / Self Care | Payer: BC Managed Care – PPO | Source: Ambulatory Visit | Attending: Cardiology | Admitting: Cardiology

## 2013-07-13 ENCOUNTER — Encounter (HOSPITAL_COMMUNITY)
Admission: RE | Admit: 2013-07-13 | Discharge: 2013-07-13 | Disposition: A | Discharge status: Home / Self Care | Payer: BC Managed Care – PPO | Source: Ambulatory Visit | Attending: Cardiology | Admitting: Cardiology

## 2013-07-13 ENCOUNTER — Ambulatory Visit (HOSPITAL_COMMUNITY): Payer: BC Managed Care – PPO

## 2013-07-14 ENCOUNTER — Telehealth: Payer: Self-pay | Admitting: Cardiology

## 2013-07-14 NOTE — Telephone Encounter (Signed)
Pt aware OK to use OTC throat lozenges

## 2013-07-14 NOTE — Telephone Encounter (Signed)
New problem    Wants to know can she a over the counter throat lozenges for counter

## 2013-07-15 ENCOUNTER — Ambulatory Visit (HOSPITAL_COMMUNITY): Payer: BC Managed Care – PPO

## 2013-07-15 ENCOUNTER — Encounter (HOSPITAL_COMMUNITY): Payer: BC Managed Care – PPO

## 2013-07-15 MED ORDER — AMINOCAPROIC ACID 25 % PO SYRP: ORAL_SOLUTION | ORAL | Status: DC

## 2013-07-17 ENCOUNTER — Encounter (HOSPITAL_COMMUNITY): Payer: BC Managed Care – PPO

## 2013-07-20 ENCOUNTER — Encounter (HOSPITAL_COMMUNITY)
Admission: RE | Admit: 2013-07-20 | Discharge: 2013-07-20 | Disposition: A | Discharge status: Home / Self Care | Payer: BC Managed Care – PPO | Source: Ambulatory Visit | Attending: Cardiology | Admitting: Cardiology

## 2013-07-20 ENCOUNTER — Ambulatory Visit (HOSPITAL_COMMUNITY): Payer: BC Managed Care – PPO

## 2013-07-22 ENCOUNTER — Encounter (HOSPITAL_COMMUNITY)
Admission: RE | Admit: 2013-07-22 | Discharge: 2013-07-22 | Disposition: A | Discharge status: Home / Self Care | Payer: BC Managed Care – PPO | Source: Ambulatory Visit | Attending: Cardiology | Admitting: Cardiology

## 2013-07-22 ENCOUNTER — Ambulatory Visit (HOSPITAL_COMMUNITY): Payer: BC Managed Care – PPO

## 2013-07-24 ENCOUNTER — Encounter (HOSPITAL_COMMUNITY)
Admission: RE | Admit: 2013-07-24 | Discharge: 2013-07-24 | Disposition: A | Discharge status: Home / Self Care | Payer: BC Managed Care – PPO | Source: Ambulatory Visit | Attending: Cardiology | Admitting: Cardiology

## 2013-07-24 ENCOUNTER — Ambulatory Visit (HOSPITAL_COMMUNITY): Payer: BC Managed Care – PPO

## 2013-07-24 DIAGNOSIS — I2510 Atherosclerotic heart disease of native coronary artery without angina pectoris: Secondary | ICD-10-CM | POA: Insufficient documentation

## 2013-07-24 DIAGNOSIS — Z5189 Encounter for other specified aftercare: Secondary | ICD-10-CM | POA: Insufficient documentation

## 2013-07-24 DIAGNOSIS — Z9861 Coronary angioplasty status: Secondary | ICD-10-CM | POA: Insufficient documentation

## 2013-07-27 ENCOUNTER — Ambulatory Visit (HOSPITAL_COMMUNITY): Payer: BC Managed Care – PPO

## 2013-07-27 ENCOUNTER — Encounter (HOSPITAL_COMMUNITY)
Admission: RE | Admit: 2013-07-27 | Discharge: 2013-07-27 | Disposition: A | Discharge status: Home / Self Care | Payer: BC Managed Care – PPO | Source: Ambulatory Visit | Attending: Cardiology | Admitting: Cardiology

## 2013-07-29 ENCOUNTER — Ambulatory Visit (HOSPITAL_COMMUNITY): Payer: BC Managed Care – PPO

## 2013-07-29 ENCOUNTER — Encounter (HOSPITAL_COMMUNITY)
Admission: RE | Admit: 2013-07-29 | Discharge: 2013-07-29 | Disposition: A | Discharge status: Home / Self Care | Payer: BC Managed Care – PPO | Source: Ambulatory Visit | Attending: Cardiology | Admitting: Cardiology

## 2013-07-30 ENCOUNTER — Encounter: Payer: Self-pay | Admitting: Internal Medicine

## 2013-07-30 ENCOUNTER — Ambulatory Visit (INDEPENDENT_AMBULATORY_CARE_PROVIDER_SITE_OTHER): Payer: BC Managed Care – PPO | Admitting: Internal Medicine

## 2013-07-30 VITALS — BP 138/59 | HR 76 | Ht 62.5 in | Wt 125.0 lb

## 2013-07-30 DIAGNOSIS — I509 Heart failure, unspecified: Secondary | ICD-10-CM

## 2013-07-30 DIAGNOSIS — I4891 Unspecified atrial fibrillation: Secondary | ICD-10-CM

## 2013-07-30 DIAGNOSIS — I5032 Chronic diastolic (congestive) heart failure: Secondary | ICD-10-CM

## 2013-07-30 DIAGNOSIS — I10 Essential (primary) hypertension: Secondary | ICD-10-CM

## 2013-07-30 DIAGNOSIS — Z950 Presence of cardiac pacemaker: Secondary | ICD-10-CM

## 2013-07-30 LAB — MDC_IDC_ENUM_SESS_TYPE_INCLINIC
Battery Impedance: 113 Ohm
Battery Remaining Longevity: 171 mo
Battery Voltage: 2.81 V
Brady Statistic AP VP Percent: 0 %
Brady Statistic AP VS Percent: 0 %
Brady Statistic AS VP Percent: 0 %
Date Time Interrogation Session: 20150108093303
Lead Channel Impedance Value: 510 Ohm
Lead Channel Pacing Threshold Amplitude: 0.75 V
Lead Channel Pacing Threshold Pulse Width: 0.4 ms
Lead Channel Pacing Threshold Pulse Width: 0.4 ms
Lead Channel Sensing Intrinsic Amplitude: 2.8 mV
Lead Channel Setting Pacing Amplitude: 1.5 V
Lead Channel Setting Pacing Amplitude: 2 V
Lead Channel Setting Pacing Pulse Width: 0.4 ms
Lead Channel Setting Sensing Sensitivity: 5.6 mV
MDC IDC MSMT LEADCHNL RV IMPEDANCE VALUE: 700 Ohm
MDC IDC MSMT LEADCHNL RV PACING THRESHOLD AMPLITUDE: 0.75 V
MDC IDC MSMT LEADCHNL RV SENSING INTR AMPL: 31.36 mV
MDC IDC STAT BRADY AS VS PERCENT: 100 %

## 2013-07-30 NOTE — Progress Notes (Signed)
HPI [name] [name] returns today for followup. She is a very pleasant 63 year old woman with multiple medical problems including mantle radiation for Hodgkin's disease, and subsequent coronary artery disease status post bypass grafting. She develop recurrent chest pain and underwent catheterization several months ago demonstrating left main stenosis and underwent stenting of the left main and left anterior descending coronary arteries. 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 She has rare palpitations, particularly when she is under stress. She has not had syncope. She denies medication noncompliance. No peripheral edema. No anginal symptoms. Allergies  Allergen Reactions  . Clindamycin/Lincomycin Anaphylaxis and Shortness Of Breath  . Penicillins Anaphylaxis, Hives and Rash    Childhood allergy   . Gentamycin [Gentamicin] Hives, Itching and Rash    Reaction noted post loop recorder implant, where gentamycin was used for irrigation. Topical prep  . Tizanidine Other (See Comments)    Hallucinate  . Atorvastatin Other (See Comments)     increased LFT's  . Avandia [Rosiglitazone Maleate]     Congestive heart failure  . Codeine Nausea And Vomiting  . Crestor [Rosuvastatin] Other (See Comments)    Muscle and joint pain &  . Erythromycin Nausea And Vomiting  . Metformin     Congestive heart failure  . Novolog [Insulin Aspart] Itching    Patient reports she has severe itching with Novolog insulin  . Plavix [Clopidogrel Bisulfate]     P2Y12 testing = 271 while on Plavix  . Plavix [Clopidogrel]     Does not work for patient  . Pravastatin Other (See Comments)    In high doses (20-41m) causes muscle and joint pain   . Simvastatin     Increased LFT's  . Cephalexin Swelling and Rash    Extremities swell   . Iodinated Diagnostic Agents Other (See Comments)    PAIN DURING LYMPHANGIOGRAM '86, NO ALLERGY TO IV CONTRAST  . Neosporin [Neomycin-Bacitracin  Zn-Polymyx] Rash     Current Outpatient Prescriptions  Medication Sig Dispense Refill  . ALPRAZolam (XANAX) 0.5 MG tablet Take 0.25 mg by mouth 2 (two) times daily as needed.       .Marland Kitchenaminocaproic acid (AMICAR) 25 % solution Take 10 ml and hold in mouth x 2 minutes then spit every 6 hours x 2 days prn gums bleeding post dental cleaning.  400 mL  0  . APIDRA 100 UNIT/ML injection Take 150-180 units daily (pump)      . aspirin EC 81 MG tablet Take 81 mg by mouth at bedtime.      . Calcium Carbonate-Vitamin D (RA CALCIUM PLUS VITAMIN D) 600-400 MG-UNIT per tablet Take 1 tablet by mouth 2 (two) times daily.       . cholecalciferol (VITAMIN D) 1000 UNITS tablet Take 1,000 Units by mouth 2 (two) times daily.       .Marland Kitchendesoximetasone (TOPICORT) 0.05 % cream Apply 1 application topically 2 (two) times daily as needed. When skin starts peeling      . docusate sodium (COLACE) 100 MG capsule Take 1 capsule (100 mg total) by mouth 2 (two) times daily.  60 capsule  3  . ezetimibe (ZETIA) 10 MG tablet Take 10 mg by mouth at bedtime.      . fenofibrate (TRICOR) 145 MG tablet Take 145 mg by mouth at bedtime.      . fenofibrate (TRICOR) 145 MG tablet TAKE ONE (1) TABLET EACH DAY  30 tablet  1  . ferrous fumarate (  HEMOCYTE - 106 MG FE) 325 (106 FE) MG TABS tablet Take 1 tablet by mouth daily. Take with Vitamin C tablet      . fluticasone (FLONASE) 50 MCG/ACT nasal spray Place 2 sprays into the nose daily as needed for rhinitis. For nasal congestion      . furosemide (LASIX) 40 MG tablet Take 1.5 tablets (60 mg total) by mouth daily.  90 tablet  11  . glucagon (GLUCAGON EMERGENCY) 1 MG injection Inject 1 mg into the vein once as needed (for severe reaction).      Marland Kitchen HM OMEPRAZOLE 20 MG TBEC TAKE ONE TABLET BY MOUTH TWICE A DAY  60 each  5  . Insulin Human (INSULIN PUMP) 100 unit/ml SOLN Inject into the skin continuous. Apidra Basal Rate: Midnight-1100=2.5 1100-1300=4.2 1300-1700=4.3 1700-midnight=4.5      .  isosorbide mononitrate (IMDUR) 30 MG 24 hr tablet Take 1 tablet (30 mg total) by mouth at bedtime.      . Magnesium 250 MG TABS Take 250 mg by mouth daily.       . metoprolol (LOPRESSOR) 50 MG tablet Take 25-50 mg by mouth 2 (two) times daily. Takes 14m in the am & 549min the pm      . Multiple Vitamins-Minerals (MULTIVITAMIN WITH MINERALS) tablet Take 1 tablet by mouth daily.        . nitroGLYCERIN (NITROLINGUAL) 0.4 MG/SPRAY spray Place 1 spray under the tongue every 5 (five) minutes as needed for chest pain.      . Marland Kitchenmeprazole (PRILOSEC OTC) 20 MG tablet Take 20 mg by mouth daily.      . polyethylene glycol (MIRALAX / GLYCOLAX) packet Take 8.5 g by mouth daily as needed. For constipation      . potassium chloride SA (K-DUR,KLOR-CON) 20 MEQ tablet Take 40 mEq by mouth daily.      . pravastatin (PRAVACHOL) 10 MG tablet Take 10 mg by mouth at bedtime.      . Ticagrelor (BRILINTA) 90 MG TABS tablet Take 1 tablet (90 mg total) by mouth 2 (two) times daily.  60 tablet  6  . vitamin C (ASCORBIC ACID) 500 MG tablet Take 250 mg by mouth daily.       . Marland KitchenETIA 10 MG tablet TAKE ONE TABLET BY MOUTH EVERY NIGHT AT BEDTIME  90 tablet  3   No current facility-administered medications for this visit.     Past Medical History  Diagnosis Date  . Aortic stenosis     a. bicuspid aortic valve; mean gradient of 20 mmHg in 2/10; b. s/p bioprosth AVR (19-mm Edwards pericardial valve) with a redo coronary artery bypass graft procedure in 07/2009; postoperative Dressler's syndrome    . Coronary artery disease     a. s/p RCA saphenous vein graft in 06/1999; b. multiple preceding PCIs with restenosis; c. total obstruction of RCA in 2006 with patent graft; d. 07/1999 Redo VG->RCA;  e. 03/2013 Cath/PCI: LM 80ost/d (2.75x24 Promus Premier DES), LAD 80ost, LCx nl, RCA 100, VG->PDA ok.  . Chronic diastolic CHF (congestive heart failure)     a. 9.2014 EchoP EF 60-65%, no rwma, bioprosth AVR, mean gradient of 1540m, mild to  mod MR, PASP 45m35m  . HyMarland Kitchenerlipidemia   . Hypertension   . Small bowel obstruction     Recurrent; resolved after resection of a lipoma  . Osteopenia      hip on DEXA in October 2007.  . AnMarland Kitchenmia   . Carcinoma, renal cell 05/2007  Laparoscopic right nephrectomy  . NASH (nonalcoholic steatohepatitis) 1999    -biopsy in 1999  . Diabetes mellitus 03/2010     TYPE II: Hemoglobin A1c of 7.4 in  03/2010; 8.4 in 06/2010; treated with insulin pump  . Migraines   . Duodenal ulcer     Remote; H. pylori positive  . Colitis, ischemic 2008  . Gastroesophageal reflux disease     ? of GASTROPARESIS; IRRITABLE BOWEL SYNDROME  . Hodgkin's disease 1991    Mantle radiation therapy  . Exertional dyspnea     chest discomfort  . Statin intolerance   . Hypercalcemia     Ca-11.1 in 07/2011  . Vitamin D deficiency   . Costochondritis   . Insomnia   . Iron deficiency anemia     a. 9.2014 EGD: essentially normal.  . Gastroparesis due to DM 05/01/2012  . PONV (postoperative nausea and vomiting)   . Hyperplastic gastric polyp 06/23/2012  . Arthritis   . Osteoporosis   . Pacemaker 08/21/2012    Medtronic Adapta L dual-chamber pacemaker-Dr. Lovena Le  . Anxiety     ROS:   All systems reviewed and negative except as noted in the HPI.   Past Surgical History  Procedure Laterality Date  . Total abdominal hysterectomy w/ bilateral salpingoophorectomy  2000  . Breast excisional biopsy      benign, bilateral  . Nephrectomy  2008    Laparoscopic right nephrectomy, renal cell carcinoma  . Coronary artery bypass graft  2005, 2011    CABG-most recent SVG to RCA-12/05; RCA occluded with patent graft in 2006; Redo bypass in 07/2009  . Tumor excision  1981    removal of Hodgkins lymphoma  . Exploratory laparotomy w/ bowel resection      Small bowel resection of lipoma  . Aortic valve replacement  2011    Aortic valve replacement surgery with a 19 mm bioprosthetic device post redo CABG January 2011.  .  Colonoscopy      Multiple with adenomatous polyps  . Cervical biopsy    . Liver biopsy    . Bone marrow biopsy    . Esophagogastroduodenoscopy    . Esophagogastroduodenoscopy  06/23/2012    Procedure: ESOPHAGOGASTRODUODENOSCOPY (EGD);  Surgeon: Gatha Mayer, MD;  Location: [facility];  Service: Endoscopy;  Laterality: N/A;  . Colonoscopy  06/23/2012    Procedure: COLONOSCOPY;  Surgeon: Gatha Mayer, MD;  Location: [facility];  Service: Endoscopy;  Laterality: N/A;  [name] dilation  06/23/2012    Procedure: [name] DILATION;  Surgeon: Gatha Mayer, MD;  Location: [facility] ENDOSCOPY;  Service: Endoscopy;;  54 fr  . Pacemaker insertion  08/21/2012    Medtronic Adapta L dual-chamber pacemaker  . Esophagogastroduodenoscopy N/A 04/06/2013    Procedure: ESOPHAGOGASTRODUODENOSCOPY (EGD);  Surgeon: Irene Shipper, MD;  Location: [facility] ENDOSCOPY;  Service: Endoscopy;  Laterality: N/A;  . Givens capsule study N/A 04/30/2013    Procedure: GIVENS CAPSULE STUDY;  Surgeon: Gatha Mayer, MD;  Location: [place];  Service: Endoscopy;  Laterality: N/A;     Family History  Problem Relation Age of Onset  . Heart attack Mother   . Heart disease Mother   . Heart attack Father   . Diabetes Father   . Heart disease Father   . Hypertension Brother   . Glaucoma Brother   . Lung cancer Sister     meta  . Breast cancer Sister     half sister (Paternal)  . Ovarian cancer Sister   .  Cervical cancer Sister     half sister (maternal)  . Breast cancer Sister   . Colon cancer Neg Hx   . Stomach cancer Neg Hx   . Lung cancer Sister     half sister (maternal)     History   Social History  . Marital Status: Married    Spouse Name: N/A    Number of Children: 2  . Years of Education: N/A   Occupational History  . full time     elementary principal   Social History Main Topics  . Smoking status: Never Smoker   . Smokeless tobacco: Never Used  . Alcohol Use: No  . Drug Use: No  . Sexual  Activity: Yes   Other Topics Concern  . 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     BP 138/59  Pulse 76  Ht 5' 2.5" (1.588 m)  Wt 125 lb (56.7 kg)  BMI 22.48 kg/m2  SpO2 100%  Physical Exam:  Well appearing 76-year-old woman,NAD HEENT: Unremarkable Neck:  No JVD, no thyromegally Back:  No CVA tenderness Lungs:  Clear with no wheezes, rales, or rhonchi. HEART:  Regular rate rhythm, no murmurs, no rubs, no clicks Abd:  soft, positive bowel sounds, no organomegally, no rebound, no guarding Ext:  2 plus pulses, no edema, no cyanosis, no clubbing Skin:  No rashes no nodules Neuro:  CN II through XII intact, motor grossly intact  DEVICE  Normal device function.  See PaceArt for details.   Assess/Plan:

## 2013-07-30 NOTE — Patient Instructions (Addendum)
Your physician recommends that you schedule a follow-up appointment in: 1 year You will receive a reminder letter two months in advance reminding you to call and schedule your appointment. If you don't receive this letter, please contact our office.   [facility] on 11-02-13

## 2013-07-30 NOTE — Assessment & Plan Note (Signed)
Interrogation of her Medtronic dual-chamber pacemaker demonstrates normal pacemaker function. She is out of rhythm less than 0.1% of the time. We will follow up.

## 2013-07-30 NOTE — Assessment & Plan Note (Signed)
Her blood pressure slightly elevated today. I've asked her wash her salt intake very carefully. She'll continue her antihypertensive medications.

## 2013-07-30 NOTE — Assessment & Plan Note (Signed)
Her heart failure symptoms remain class II. She will continue her current medical therapy, and maintain a low-sodium diet.

## 2013-07-30 NOTE — Assessment & Plan Note (Signed)
The patient has very rare episodes of paroxysmal atrial fibrillation. Because she is already on Brilinta I am concerned about adding warfarin or 1 of a novel agents. She is at risk for stroke, but she is also at risk for bleeding. We will undergo watchful waiting.

## 2013-07-31 ENCOUNTER — Ambulatory Visit (HOSPITAL_COMMUNITY): Payer: BC Managed Care – PPO

## 2013-07-31 ENCOUNTER — Encounter (HOSPITAL_COMMUNITY)
Admission: RE | Admit: 2013-07-31 | Discharge: 2013-07-31 | Disposition: A | Discharge status: Home / Self Care | Payer: BC Managed Care – PPO | Source: Ambulatory Visit | Attending: Cardiology | Admitting: Cardiology

## 2013-08-02 ENCOUNTER — Other Ambulatory Visit: Payer: Self-pay | Admitting: Cardiology

## 2013-08-03 ENCOUNTER — Encounter (HOSPITAL_COMMUNITY)
Admission: RE | Admit: 2013-08-03 | Discharge: 2013-08-03 | Disposition: A | Discharge status: Home / Self Care | Payer: BC Managed Care – PPO | Source: Ambulatory Visit | Attending: Cardiology | Admitting: Cardiology

## 2013-08-05 ENCOUNTER — Encounter (HOSPITAL_COMMUNITY): Payer: BC Managed Care – PPO

## 2013-08-07 ENCOUNTER — Encounter: Payer: Self-pay | Admitting: Internal Medicine

## 2013-08-07 ENCOUNTER — Encounter (HOSPITAL_COMMUNITY)
Admission: RE | Admit: 2013-08-07 | Discharge: 2013-08-07 | Disposition: A | Discharge status: Home / Self Care | Payer: BC Managed Care – PPO | Source: Ambulatory Visit | Attending: Cardiology | Admitting: Cardiology

## 2013-08-10 ENCOUNTER — Encounter (HOSPITAL_COMMUNITY)
Admission: RE | Admit: 2013-08-10 | Discharge: 2013-08-10 | Disposition: A | Discharge status: Home / Self Care | Payer: BC Managed Care – PPO | Source: Ambulatory Visit | Attending: Cardiology | Admitting: Cardiology

## 2013-08-12 ENCOUNTER — Encounter (HOSPITAL_COMMUNITY)
Admission: RE | Admit: 2013-08-12 | Discharge: 2013-08-12 | Disposition: A | Discharge status: Home / Self Care | Payer: BC Managed Care – PPO | Source: Ambulatory Visit | Attending: Cardiology | Admitting: Cardiology

## 2013-08-14 ENCOUNTER — Encounter (HOSPITAL_COMMUNITY)
Admission: RE | Admit: 2013-08-14 | Discharge: 2013-08-14 | Disposition: A | Discharge status: Home / Self Care | Payer: BC Managed Care – PPO | Source: Ambulatory Visit | Attending: Cardiology | Admitting: Cardiology

## 2013-08-17 ENCOUNTER — Encounter (HOSPITAL_COMMUNITY): Payer: BC Managed Care – PPO

## 2013-08-19 ENCOUNTER — Encounter (HOSPITAL_COMMUNITY): Payer: BC Managed Care – PPO

## 2013-08-21 ENCOUNTER — Encounter (HOSPITAL_COMMUNITY): Payer: BC Managed Care – PPO

## 2013-08-24 ENCOUNTER — Encounter (HOSPITAL_COMMUNITY)
Admission: RE | Admit: 2013-08-24 | Discharge: 2013-08-24 | Disposition: A | Discharge status: Home / Self Care | Payer: BC Managed Care – PPO | Source: Ambulatory Visit | Attending: Cardiology | Admitting: Cardiology

## 2013-08-24 ENCOUNTER — Ambulatory Visit: Payer: BC Managed Care – PPO | Admitting: Cardiology

## 2013-08-24 DIAGNOSIS — I2510 Atherosclerotic heart disease of native coronary artery without angina pectoris: Secondary | ICD-10-CM | POA: Insufficient documentation

## 2013-08-24 DIAGNOSIS — Z5189 Encounter for other specified aftercare: Secondary | ICD-10-CM | POA: Insufficient documentation

## 2013-08-24 DIAGNOSIS — Z9861 Coronary angioplasty status: Secondary | ICD-10-CM | POA: Insufficient documentation

## 2013-08-25 ENCOUNTER — Encounter (HOSPITAL_COMMUNITY): Payer: BC Managed Care – PPO

## 2013-08-26 ENCOUNTER — Encounter: Payer: Self-pay | Admitting: Cardiology

## 2013-08-26 ENCOUNTER — Encounter: Payer: Self-pay | Admitting: *Deleted

## 2013-08-26 ENCOUNTER — Ambulatory Visit (INDEPENDENT_AMBULATORY_CARE_PROVIDER_SITE_OTHER): Payer: BC Managed Care – PPO | Admitting: Cardiology

## 2013-08-26 ENCOUNTER — Encounter (HOSPITAL_COMMUNITY): Payer: BC Managed Care – PPO

## 2013-08-26 ENCOUNTER — Ambulatory Visit (HOSPITAL_COMMUNITY): Payer: BC Managed Care – PPO | Attending: Cardiology

## 2013-08-26 VITALS — BP 124/70 | HR 78 | Ht 62.5 in | Wt 121.0 lb

## 2013-08-26 DIAGNOSIS — N183 Chronic kidney disease, stage 3 unspecified: Secondary | ICD-10-CM

## 2013-08-26 DIAGNOSIS — I509 Heart failure, unspecified: Secondary | ICD-10-CM

## 2013-08-26 DIAGNOSIS — I658 Occlusion and stenosis of other precerebral arteries: Secondary | ICD-10-CM | POA: Insufficient documentation

## 2013-08-26 DIAGNOSIS — E785 Hyperlipidemia, unspecified: Secondary | ICD-10-CM

## 2013-08-26 DIAGNOSIS — I5032 Chronic diastolic (congestive) heart failure: Secondary | ICD-10-CM

## 2013-08-26 DIAGNOSIS — I5030 Unspecified diastolic (congestive) heart failure: Secondary | ICD-10-CM

## 2013-08-26 DIAGNOSIS — I6529 Occlusion and stenosis of unspecified carotid artery: Secondary | ICD-10-CM | POA: Insufficient documentation

## 2013-08-26 DIAGNOSIS — E119 Type 2 diabetes mellitus without complications: Secondary | ICD-10-CM | POA: Insufficient documentation

## 2013-08-26 DIAGNOSIS — D649 Anemia, unspecified: Secondary | ICD-10-CM

## 2013-08-26 DIAGNOSIS — I2510 Atherosclerotic heart disease of native coronary artery without angina pectoris: Secondary | ICD-10-CM | POA: Insufficient documentation

## 2013-08-26 DIAGNOSIS — I4891 Unspecified atrial fibrillation: Secondary | ICD-10-CM

## 2013-08-26 DIAGNOSIS — I25810 Atherosclerosis of coronary artery bypass graft(s) without angina pectoris: Secondary | ICD-10-CM

## 2013-08-26 DIAGNOSIS — I10 Essential (primary) hypertension: Secondary | ICD-10-CM | POA: Insufficient documentation

## 2013-08-26 LAB — CBC WITH DIFFERENTIAL/PLATELET
BASOS ABS: 0 10*3/uL (ref 0.0–0.1)
Basophils Relative: 0.4 % (ref 0.0–3.0)
EOS ABS: 0.2 10*3/uL (ref 0.0–0.7)
Eosinophils Relative: 1.7 % (ref 0.0–5.0)
HEMATOCRIT: 42.1 % (ref 36.0–46.0)
Hemoglobin: 13.9 g/dL (ref 12.0–15.0)
LYMPHS ABS: 1.7 10*3/uL (ref 0.7–4.0)
Lymphocytes Relative: 17.2 % (ref 12.0–46.0)
MCHC: 33 g/dL (ref 30.0–36.0)
MCV: 90.3 fl (ref 78.0–100.0)
MONO ABS: 0.6 10*3/uL (ref 0.1–1.0)
Monocytes Relative: 5.8 % (ref 3.0–12.0)
Neutro Abs: 7.6 10*3/uL (ref 1.4–7.7)
Neutrophils Relative %: 74.9 % (ref 43.0–77.0)
PLATELETS: 220 10*3/uL (ref 150.0–400.0)
RBC: 4.67 Mil/uL (ref 3.87–5.11)
RDW: 13.4 % (ref 11.5–14.6)
WBC: 10.2 10*3/uL (ref 4.5–10.5)

## 2013-08-26 LAB — BASIC METABOLIC PANEL
BUN: 29 mg/dL — AB (ref 6–23)
CHLORIDE: 100 meq/L (ref 96–112)
CO2: 28 mEq/L (ref 19–32)
CREATININE: 1.1 mg/dL (ref 0.4–1.2)
Calcium: 9.8 mg/dL (ref 8.4–10.5)
GFR: 54.35 mL/min — ABNORMAL LOW (ref >60.00)
GLUCOSE: 171 mg/dL — AB (ref 70–99)
Potassium: 3.7 mEq/L (ref 3.5–5.1)
Sodium: 138 mEq/L (ref 135–145)

## 2013-08-26 LAB — BRAIN NATRIURETIC PEPTIDE: Pro B Natriuretic peptide (BNP): 71 pg/mL (ref 0.0–100.0)

## 2013-08-26 NOTE — Patient Instructions (Signed)
Your physician recommends that you return for lab work today--BMET/BNP/CBCd  Your physician has requested that you have a carotid duplex. This test is an ultrasound of the carotid arteries in your neck. It looks at blood flow through these arteries that supply the brain with blood. Allow one hour for this exam. There are no restrictions or special instructions.  You have been referred to Nephrology for management of chronic kidney disease.  Your physician wants you to follow-up in: 6 months with [name]. (August 2015). You will receive a reminder letter in the mail two months in advance. If you don't receive a letter, please call our office to schedule the follow-up appointment.

## 2013-08-26 NOTE — Progress Notes (Signed)
Patient ID: [name] [name], female   DOB: 1951-06-09, 63 y.o.   MRN: [id number] PCP: Dr. Orson Ape  63 yo with history of CAD s/p CABG in 12/05 and redo in 1/11, bioprosthetic AVR, diastolic CHF, and sick sinus syndrome s/p PCM presents for cardiology followup.  She had initial CABG with SVG-RCA in 12/05 followed by redo SVG-RCA and bioprosthetic AVR in 1/11.  Last echo in 9/14 showed normal EF with well-seated aortic valve, mild to moderate MR, and PA systolic pressure 51 mmHg.  She has had diastolic CHF and is on Lasix.  In 9/14, she had a CPX test.  This showed severe functional limitation with ischemic ECG changes (inferolateral ST depression and ST elevation in V1 and V2). She had chest pain.  I took her for [facility] in 9/14.  This showed elevated left and right heart filling pressures, patent SVG-PDA, and 80% distal LM/80% ostial LAD stenosis.  Patient was deemed not a candidate for redo CABG (had 2 prior sternotomies as well as chest radiation for Hodgkins lymphoma).  She had DES from left main into proximal LAD and will need long-term DAPT => Brilinta was used as she has been a poor responder to Plavix in the past.    She has chronic iron deficiency anemia thought to be due to a slow GI bleed.  Last EGD was unremarkable and a capsule endoscopy was also unremarkable.  She has periodic transfusions; However, recently hemoglobin has been stable.   She has an insulin pump followed by an endocrinologist at [facility].   Patient is on a very low dose of pravastatin.  She has been unable to tolerate multiple statins or higher pravastatin dose due to myalgias.    Currently, she seems to be doing well.  She had a flu-like illness last week but seems to be over it now.  She is doing cardiac rehab at [facility]. No significant chest pain.  She can walk on flat ground and up a flight of steps without dyspnea.  She has been noted on PCM interrogation to have very rare atrial fibrillation episodes.   ECG: NSR, LAE, 1st  degree AV block  Labs (7/14): hemoglobin 11 Labs (9/14): K 3.9, creatinine 1.48, hemoglobin 7.3 Labs (10/14): hemoglobin 7.7 Labs (11/14): K 3.8, creatinine 1.5 Labs (12/14): HCT 37.7 Labs (1/15): LDL 106, HDL 41, K 3.9, creatinine 1.6, LFTs normal  PMH: 1. Sick sinus syndrome s/p [employer] PCM.  2. HTN 3. H/o SBO 4. Renal cell carcinoma s/p right nephrectomy in 2008.  5. NASH 6. Diastolic CHF: Echo (7/86) with EF 60-65%, bioprosthetic aortic valve with mean gradient 15 mmHg, mild MR.  CPX (9/14): peak VO2 10.4, VE/VCO2 43.8, inferolateral ST depression and ST elevation in V1/V2 with severe dyspnea and chest pain, severe functional limitation with ischemic changes.  Echo (9/14) with EF 60-65%, normal RV size and systolic function, mild-moderate MR, bioprosthetic aortic valve normal, PA systolic pressure 51 mmHg, no evidence for pericardial constriction though exam incomplete for constriction.  7. TAH-BSO 8. Type II diabetes: Has insulin pump.  9. H/o ischemic colitis. 10. H/o PUD. 11. Diabetic gastroparesis. 12. Hyperlipidemia: Myalgias with Crestor and atorvastatin, elevated LFTs with simvastatin.  Myalgias with > 10 mg pravastatin.  13. CKD 14. Bicuspid aortic valve s/p 19 mm Edwards pericardial valve in 1/11 (had post-operative Dresslers syndrome).   15. CAD: CABG with SVG-RCA in 12/05.  Redo SVG-RCA in 1/11 with AVR.  LHC/RHC (9/14) with mean RA 18, PA 62/25 mean 43, mean  PCWP 26, CI 2.8, 70-80% distal LM stenosis, 80% ostial LAD stenosis, total occlusion RCA, SVG-PDA patent.  Patient had DES LM into LAD.  Plan to continue long-term ASA/Brilinta (poor responder to Plavix).  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  17. Anemia: Iron-deficiency.  Suspect chronic GI blood loss.  EGD in 9/14 was unremarkable.  Capsule endoscopy 10/14 was unremarkable.  Has required periodic blood transfusions.  18. Atrial fibrillation: Paroxysmal, noted by [facility]  interrogation.   SH: Married, lives in [place], nonsmoker.   FH: CAD  ROS: All systems reviewed and negative except as per HPI.   Current Outpatient Prescriptions  Medication Sig Dispense Refill  . ALPRAZolam (XANAX) 0.5 MG tablet Take 0.25 mg by mouth 2 (two) times daily as needed.       Marland Kitchen aminocaproic acid (AMICAR) 25 % solution Take 10 ml and hold in mouth x 2 minutes then spit every 6 hours x 2 days prn gums bleeding post dental cleaning.  400 mL  0  . APIDRA 100 UNIT/ML injection Take 150-180 units daily (pump)      . aspirin EC 81 MG tablet Take 81 mg by mouth at bedtime.      . Calcium Carbonate-Vitamin D (RA CALCIUM PLUS VITAMIN D) 600-400 MG-UNIT per tablet Take 1 tablet by mouth 2 (two) times daily.       . cholecalciferol (VITAMIN D) 1000 UNITS tablet Take 1,000 Units by mouth 2 (two) times daily.       Marland Kitchen desoximetasone (TOPICORT) 0.05 % cream Apply 1 application topically 2 (two) times daily as needed. When skin starts peeling      . docusate sodium (COLACE) 100 MG capsule Take 1 capsule (100 mg total) by mouth 2 (two) times daily.  60 capsule  3  . fenofibrate (TRICOR) 145 MG tablet TAKE ONE (1) TABLET EACH DAY  30 tablet  1  . ferrous fumarate (HEMOCYTE - 106 MG FE) 325 (106 FE) MG TABS tablet Take 1 tablet by mouth 2 (two) times a week. Take with Vitamin C tablet      . fluticasone (FLONASE) 50 MCG/ACT nasal spray Place 2 sprays into the nose daily as needed for rhinitis. For nasal congestion      . furosemide (LASIX) 40 MG tablet Take 1.5 tablets (60 mg total) by mouth daily.  90 tablet  11  . glucagon (GLUCAGON EMERGENCY) 1 MG injection Inject 1 mg into the vein once as needed (for severe reaction).      . Insulin Human (INSULIN PUMP) 100 unit/ml SOLN Inject into the skin continuous. Apidra Basal Rate: Midnight-1100=2.5 1100-1300=4.2 1300-1700=4.3 1700-midnight=4.5      . isosorbide mononitrate (IMDUR) 30 MG 24 hr tablet Take 1 tablet (30 mg total) by mouth at bedtime.       . Magnesium 250 MG TABS Take 250 mg by mouth daily.       . metoprolol (LOPRESSOR) 50 MG tablet Take 25-50 mg by mouth 2 (two) times daily. Takes 43m in the am & 556min the pm      . Multiple Vitamins-Minerals (MULTIVITAMIN WITH MINERALS) tablet Take 1 tablet by mouth daily.        . nitroGLYCERIN (NITROLINGUAL) 0.4 MG/SPRAY spray Place 1 spray under the tongue every 5 (five) minutes as needed for chest pain.      . Marland Kitchenmeprazole (PRILOSEC OTC) 20 MG tablet Take 20 mg by mouth daily.      . polyethylene glycol (MIRALAX /  GLYCOLAX) packet Take 8.5 g by mouth daily as needed. For constipation      . potassium chloride SA (K-DUR,KLOR-CON) 20 MEQ tablet TAKE TWO TABLETS BY MOUTH DAILY  60 tablet  3  . pravastatin (PRAVACHOL) 10 MG tablet Take 10 mg by mouth at bedtime.      . Ticagrelor (BRILINTA) 90 MG TABS tablet Take 1 tablet (90 mg total) by mouth 2 (two) times daily.  60 tablet  6  . vitamin C (ASCORBIC ACID) 500 MG tablet Take 250 mg by mouth daily.       Marland Kitchen ZETIA 10 MG tablet TAKE ONE TABLET BY MOUTH EVERY NIGHT AT BEDTIME  90 tablet  3   No current facility-administered medications for this visit.    BP 124/70  Pulse 78  Ht 5' 2.5" (1.588 m)  Wt 121 lb (54.885 kg)  BMI 21.76 kg/m2 General: NAD Neck: JVP 7 cm, no thyromegaly or thyroid nodule.  Lungs: Clear to auscultation bilaterally with normal respiratory effort. CV: Nondisplaced PMI.  Heart regular S1/S2, no E9/F8, 2/6 systolic murmur RUSB.  No peripheral edema.  Left carotid bruit.  2+ PT pulses bilaterally.  Abdomen: Soft, nontender, no hepatosplenomegaly, no distention.  Skin: Intact without lesions or rashes.  Neurologic: Alert and oriented x 3.  Psych: Normal affect. Extremities: No clubbing or cyanosis.   Assessment/Plan: 1. CAD: s/p SVG-RCA in 12/05, then redo SVG-RCA in 1/11.  She was found to have distal left main/proximal LAD severe stenosis in 9/14.  She was very symptomatic.  PCI with DES to distal LM/proximal  LAD was done rather than bypass as she would have been a poor candidate for redo sternotomy (2 prior sternotomies and history of chest wall radiation for Hodgkins disease).  She will need to be on Brilinta and ASA (poor responder to plavix) long-term.  This is complicated by suspected slow GI bleed. So far, this has been controlled with periodic transfusions (hemoglobin has been stable recently). Currently, when anemia is controlled, she does not have significant chest pain.  Continue ASA 81, statin, imdur, metoprolol, Brilinta.  Continue cardiac rehab.  2. Bioprosthetic aortic valve replacement: Stable on last echo.  3. Diastolic CHF: Elevated left and right heart filling pressures on RHC prior to recent coronary intervention.  Today, she does not appear significantly volume overloaded, and she has minimal dyspnea.  Symptoms tend to flare when she is anemic.  - Continue current Lasix dosing, check BMET/BNP today.  4. CKD: BMET today.  Patient has a single kidney s/p right nephrectomy.  She asks about getting a nephrologist.  I think that would be reasonable and will refer her.  5. Hyperlipidemia: She has not been able to tolerate any higher potency statin than pravastatin 10 mg daily.  She is on Zetia. LDL was 106 in 1/15.  6. Anemia: Suspected slow GI bleed.  Recent unremarkable EGD and capsule endoscopy in fall 2014.  Unfortunately, needs to stay on Brilinta.  Will need to follow closely (sees hematology), plan for transfusion with hemoglobin < 8 as this allows best control of her symptoms. Will get CBC today.  7. Atrial fibrillation: Paroxysmal, brief episodes noted on PCM interrogation.  Given GI bleeding and need to be on both ASA 81 and Brilinta, risks likely outweigh benefits of anticoagulation.  8. Carotid bruit: Check carotid dopplers.    [name] 08/26/2013

## 2013-08-27 ENCOUNTER — Encounter: Payer: Self-pay | Admitting: *Deleted

## 2013-08-28 ENCOUNTER — Telehealth: Payer: Self-pay | Admitting: Cardiology

## 2013-08-28 ENCOUNTER — Encounter (HOSPITAL_COMMUNITY)
Admission: RE | Admit: 2013-08-28 | Discharge: 2013-08-28 | Disposition: A | Discharge status: Home / Self Care | Payer: BC Managed Care – PPO | Source: Ambulatory Visit | Attending: Cardiology | Admitting: Cardiology

## 2013-08-28 NOTE — Telephone Encounter (Signed)
Pt had carotid doppler done 08/26/13, does not need to schedule another carotid doppler right now.

## 2013-08-28 NOTE — Telephone Encounter (Signed)
New Message  Pt called states that she received a voicemail indicating she was due for a Carotid from [facility]. She has had a Carotid appointment previously and is not sure why another carotid is needed. She is requesting a call back to discuss.

## 2013-08-31 ENCOUNTER — Encounter (HOSPITAL_COMMUNITY)
Admission: RE | Admit: 2013-08-31 | Discharge: 2013-08-31 | Disposition: A | Discharge status: Home / Self Care | Payer: BC Managed Care – PPO | Source: Ambulatory Visit | Attending: Cardiology | Admitting: Cardiology

## 2013-09-01 ENCOUNTER — Other Ambulatory Visit: Payer: Self-pay | Admitting: Cardiology

## 2013-09-02 ENCOUNTER — Encounter (HOSPITAL_COMMUNITY)
Admission: RE | Admit: 2013-09-02 | Discharge: 2013-09-02 | Disposition: A | Discharge status: Home / Self Care | Payer: BC Managed Care – PPO | Source: Ambulatory Visit | Attending: Cardiology | Admitting: Cardiology

## 2013-09-04 ENCOUNTER — Encounter (HOSPITAL_COMMUNITY): Payer: BC Managed Care – PPO

## 2013-09-07 ENCOUNTER — Encounter (HOSPITAL_COMMUNITY)
Admission: RE | Admit: 2013-09-07 | Discharge: 2013-09-07 | Disposition: A | Discharge status: Home / Self Care | Payer: BC Managed Care – PPO | Source: Ambulatory Visit | Attending: Cardiology | Admitting: Cardiology

## 2013-09-09 ENCOUNTER — Telehealth: Payer: Self-pay | Admitting: Cardiology

## 2013-09-09 ENCOUNTER — Encounter (HOSPITAL_COMMUNITY): Payer: BC Managed Care – PPO

## 2013-09-09 MED ORDER — METOPROLOL TARTRATE 50 MG PO TABS: ORAL_TABLET | ORAL | Status: DC

## 2013-09-09 NOTE — Addendum Note (Signed)
Encounter addended by: Norlene Duel, RN on: 09/09/2013  9:42 AM<BR>     Documentation filed: Clinical Notes

## 2013-09-09 NOTE — Progress Notes (Signed)
Cardiac Rehabilitation Program Outcomes Report   Orientation:  06/23/2013 Halfway report:08/24/2013 Graduate Date:  tbd Discharge Date:  tbd # of sessions completed: 18 DX CAD Angina, Atrial Fib and HTN  Cardiologist: McLean/ Lovena Le Family MD:  Orson Ape Class Time:  09:30  A.  Exercise Program:  Tolerates exercise @ 2.30 METS for 15 minutes  B.  Mental Health:  Good mental attitude  C.  Education/Instruction/Skills  Accurately checks own pulse.  Rest:  77  Exercise:  102, Knows THR for exercise and Uses Perceived Exertion Scale and/or Dyspnea Scale  Uses Perceived Exertion Scale and/or Dyspnea Scale  D.  Nutrition/Weight Control/Body Composition:  Adherence to prescribed nutrition program: good    E.  Blood Lipids    Lab Results  Component Value Date   CHOL 151 04/04/2013   HDL 29* 04/04/2013   LDLCALC 99 04/04/2013   TRIG 116 04/04/2013   CHOLHDL 5.2 04/04/2013    F.  Lifestyle Changes:  Making positive lifestyle changes and Not smoking:  Quit Never Smoked  G.  Symptoms noted with exercise:  Asymptomatic  Report Completed By:  Oletta Lamas. Toye Rouillard RN   Comments:  This is patients halfway report. Her resting HR was 77 and her resting BP was 126/62 and peak HR was 103 and peak BP was 140/50. A graduation report will follow upon her 36th visit.

## 2013-09-09 NOTE — Progress Notes (Signed)
Cardiac Rehabilitation Program Outcomes Report   Orientation:  06/23/2013 1st week : 07/03/2013 Graduate Date:  tbd Discharge Date:  tbd # of sessions completed: 3 DX: CAD, Angina, Afib,and HTN  Cardiologist: McLean/Taylor Family MD:  McGough Class Time:  09:30  A.  Exercise Program:  Tolerates exercise @ 3.64 METS for 15 minutes and Walk Test Results:  Pre: Pre walk Test: Rest HR 70, BP 120/52,O2 99%, RPE 9and RPD 9.  6 min  HR 85 , BP 150/62 O2 91%, RPE  12 and RPD 13, Post HR 71, BP 130/62,O2 100% and RPE 8 and RPD 8 walked 1280f.  B.  Mental Health:  Good mental attitude  C.  Education/Instruction/Skills  Accurately checks own pulse.  Rest:  80  Exercise:  103, Knows THR for exercise and Uses Perceived Exertion Scale and/or Dyspnea Scale  Demonstrates accurate diaphragmatic breathing  D.  Nutrition/Weight Control/Body Composition:  Adherence to prescribed nutrition program: good    E.  Blood Lipids    Lab Results  Component Value Date   CHOL 151 04/04/2013   HDL 29* 04/04/2013   LDLCALC 99 04/04/2013   TRIG 116 04/04/2013   CHOLHDL 5.2 04/04/2013    F.  Lifestyle Changes:  Making positive lifestyle changes and Not smoking:  Quit never smoked  G.  Symptoms noted with exercise:  Asymptomatic  Report Completed By:  NOletta Lamas [name] Cordrey RN   Comments:  This is patients 1st week report. She has done well her first week. Her resting HR was 80 and resting BP was 100/60, Her peak HR was 103 and peak BP was 100/62. A report will follow upon her 18th visit her halfway point.

## 2013-09-09 NOTE — Telephone Encounter (Signed)
Could try increasing metoprolol to 75 mg bid, will help control rate and decrease risk of chest pain.

## 2013-09-09 NOTE — Telephone Encounter (Signed)
New message     C/O Afib on yesterday last about  2:30 pm . Took blood pressure by machine 93/63 . Heart 126 .

## 2013-09-09 NOTE — Addendum Note (Signed)
Encounter addended by: Norlene Duel, RN on: 09/09/2013  9:40 AM<BR>     Documentation filed: Notes Section

## 2013-09-09 NOTE — Telephone Encounter (Signed)
Pt states riding in truck yesterday, not feeling anxious, and developed chest tightness, used 3 NTG sprays with relief. Shortly after that she developed A Fib that lasted about 15 minutes, she took any extra metoprolol at that time. She is not any at fib any longer. She exercised this morning at her home without symptoms. I offered pt an appt today but she declined because she feels good. Pt asking if [name] has further recommendations.

## 2013-09-09 NOTE — Telephone Encounter (Signed)
Pt advised.

## 2013-09-11 ENCOUNTER — Encounter (HOSPITAL_COMMUNITY)
Admission: RE | Admit: 2013-09-11 | Discharge: 2013-09-11 | Disposition: A | Discharge status: Home / Self Care | Payer: BC Managed Care – PPO | Source: Ambulatory Visit | Attending: Cardiology | Admitting: Cardiology

## 2013-09-14 ENCOUNTER — Encounter (HOSPITAL_COMMUNITY)
Admission: RE | Admit: 2013-09-14 | Discharge: 2013-09-14 | Disposition: A | Discharge status: Home / Self Care | Payer: BC Managed Care – PPO | Source: Ambulatory Visit | Attending: Cardiology | Admitting: Cardiology

## 2013-09-16 ENCOUNTER — Encounter (HOSPITAL_COMMUNITY)
Admission: RE | Admit: 2013-09-16 | Discharge: 2013-09-16 | Disposition: A | Discharge status: Home / Self Care | Payer: BC Managed Care – PPO | Source: Ambulatory Visit | Attending: Cardiology | Admitting: Cardiology

## 2013-09-18 ENCOUNTER — Encounter (HOSPITAL_COMMUNITY)
Admission: RE | Admit: 2013-09-18 | Discharge: 2013-09-18 | Disposition: A | Discharge status: Home / Self Care | Payer: BC Managed Care – PPO | Source: Ambulatory Visit | Attending: Cardiology | Admitting: Cardiology

## 2013-09-21 ENCOUNTER — Encounter (HOSPITAL_COMMUNITY)
Admission: RE | Admit: 2013-09-21 | Discharge: 2013-09-21 | Disposition: A | Discharge status: Home / Self Care | Payer: BC Managed Care – PPO | Source: Ambulatory Visit | Attending: Cardiology | Admitting: Cardiology

## 2013-09-21 DIAGNOSIS — Z5189 Encounter for other specified aftercare: Secondary | ICD-10-CM | POA: Insufficient documentation

## 2013-09-21 DIAGNOSIS — Z9861 Coronary angioplasty status: Secondary | ICD-10-CM | POA: Insufficient documentation

## 2013-09-21 DIAGNOSIS — I2510 Atherosclerotic heart disease of native coronary artery without angina pectoris: Secondary | ICD-10-CM | POA: Insufficient documentation

## 2013-09-23 ENCOUNTER — Encounter (HOSPITAL_COMMUNITY)
Admission: RE | Admit: 2013-09-23 | Discharge: 2013-09-23 | Disposition: A | Discharge status: Home / Self Care | Payer: BC Managed Care – PPO | Source: Ambulatory Visit | Attending: Cardiology | Admitting: Cardiology

## 2013-09-25 ENCOUNTER — Encounter (HOSPITAL_COMMUNITY): Payer: BC Managed Care – PPO

## 2013-09-28 ENCOUNTER — Other Ambulatory Visit: Payer: Self-pay | Admitting: Cardiology

## 2013-09-28 ENCOUNTER — Encounter (HOSPITAL_COMMUNITY)
Admission: RE | Admit: 2013-09-28 | Discharge: 2013-09-28 | Disposition: A | Discharge status: Home / Self Care | Payer: BC Managed Care – PPO | Source: Ambulatory Visit | Attending: Cardiology | Admitting: Cardiology

## 2013-09-30 ENCOUNTER — Encounter (HOSPITAL_COMMUNITY)
Admission: RE | Admit: 2013-09-30 | Discharge: 2013-09-30 | Disposition: A | Discharge status: Home / Self Care | Payer: BC Managed Care – PPO | Source: Ambulatory Visit | Attending: Cardiology | Admitting: Cardiology

## 2013-09-30 ENCOUNTER — Telehealth: Payer: Self-pay | Admitting: Cardiology

## 2013-09-30 NOTE — Telephone Encounter (Signed)
Pt states that she has been in cardiac rehab post stent placement. Pt states that in mid February she had some episodes of angina pain which went away with NTG SL. When doing the treadmill in cardiac rehab at 2.7 miles and after 10 minutes she started to have chest pain , and after stop the activity the pain would go away.  The last week pt started to get SOB when climbing a few stairs. Pt would like to be seen soon because she is afraid that the stents may be blocking. An appointment was made for pt for tomorrow 10/01/13 with Ermalinda Barrios NP at 9:45 Am. Pt aware of appointment.

## 2013-09-30 NOTE — Telephone Encounter (Signed)
New problem   Pt need to speak to nurse because she is having SOB, if hr goes to 105 she has chest pain and angina. Pt isn't experiencing any of these symptoms at the present. Please call pt.

## 2013-10-01 ENCOUNTER — Encounter: Payer: Self-pay | Admitting: Physician Assistant

## 2013-10-01 ENCOUNTER — Ambulatory Visit (INDEPENDENT_AMBULATORY_CARE_PROVIDER_SITE_OTHER): Payer: BC Managed Care – PPO | Admitting: Physician Assistant

## 2013-10-01 ENCOUNTER — Telehealth: Payer: Self-pay | Admitting: Physician Assistant

## 2013-10-01 VITALS — BP 127/68 | HR 76 | Ht 63.0 in | Wt 124.1 lb

## 2013-10-01 DIAGNOSIS — I25810 Atherosclerosis of coronary artery bypass graft(s) without angina pectoris: Secondary | ICD-10-CM

## 2013-10-01 DIAGNOSIS — I5032 Chronic diastolic (congestive) heart failure: Secondary | ICD-10-CM

## 2013-10-01 DIAGNOSIS — D649 Anemia, unspecified: Secondary | ICD-10-CM

## 2013-10-01 DIAGNOSIS — I4891 Unspecified atrial fibrillation: Secondary | ICD-10-CM

## 2013-10-01 DIAGNOSIS — D509 Iron deficiency anemia, unspecified: Secondary | ICD-10-CM

## 2013-10-01 DIAGNOSIS — I2510 Atherosclerotic heart disease of native coronary artery without angina pectoris: Secondary | ICD-10-CM

## 2013-10-01 DIAGNOSIS — I209 Angina pectoris, unspecified: Secondary | ICD-10-CM

## 2013-10-01 DIAGNOSIS — I208 Other forms of angina pectoris: Secondary | ICD-10-CM

## 2013-10-01 DIAGNOSIS — I509 Heart failure, unspecified: Secondary | ICD-10-CM

## 2013-10-01 MED ORDER — METOPROLOL TARTRATE 50 MG PO TABS: ORAL_TABLET | ORAL | Status: DC

## 2013-10-01 NOTE — Patient Instructions (Signed)
DECREASE METOPROLOL TO 50 MG TWICE DAILY  STAT LABS TODAY; BMET, CBC W/DIFF  PER MICHELLE LENZE PT NEEDS TO FOLLOW UP WITH DR. Aundra Dubin IN THE NEXT 1-2 WEEKS

## 2013-10-01 NOTE — Assessment & Plan Note (Signed)
Patient normal sinus rhythm

## 2013-10-01 NOTE — Telephone Encounter (Signed)
Spoke with patient and gave her results of CBC and BMET. Told her she can increase her Imdur to 52m daily for angina. F/u with Dr. MAundra Dubinnext week. Go to ER for prolonged chest pain.

## 2013-10-01 NOTE — Assessment & Plan Note (Signed)
No evidence of heart failure

## 2013-10-01 NOTE — Assessment & Plan Note (Signed)
Increase in angina. Please see above dictation

## 2013-10-01 NOTE — Assessment & Plan Note (Signed)
Patient has had increase in her angina over the past 2-3 weeks. She has a very complicated history. I will check her CBC status makes sure she isn't anemic which could be contributing to her angina. I will also decrease her metoprolol to 50 mg twice a day. She is at risk of cardiac catheterization because she only has one kidney and has renal insufficiency. She is to call if she has any increase in her angina. She is to go to the emergency room for any prolonged chest pain. She will see Dr. Marigene Ehlers back next week.

## 2013-10-01 NOTE — Progress Notes (Signed)
HPI: This is a very pleasant but complicated 63 year old female patient of Dr. Aundra Dubin who has history of coronary artery disease status post SVG to the RCA in 12/05 the redo SVG to the RCA in 1/11. She was found to have distal left main/proximal LAD severe stenosis in 9/14 she was very symptomatic. She underwent drug-eluting stent to the distal left main/proximal LAD rather than bypass because she was felt to be a poor candidate for redo sternotomy. She's had 2 prior sternotomies and history of chest wall radiation for Hodgkin's disease. She's been on Brilinta and aspirin. She was a poor responder to Plavix. This was complicated by suspected slow GI bleed that has been controlled with periodic transfusions. She also has a bioprosthetic aortic valve replacement which has been stable.  The patient comes in today complaining of increasing angina. She had started cardiac rehabilitation and was doing well until 2 or 3 weeks ago. Suddenly she is unable to walk at the same rate. She had one episode of chest pain at rest that required 3 nitroglycerin to relieve it. Since then she complains of dyspnea on exertion with very little activity. She also gets short of breath and some chest tightness when walking at cardiac rehabilitation. If she stops her exercise it goes away immediately. She has had no further rest pain. She has not had her hemoglobin checked recently. She also says a lot of her fatigue and dyspnea on exertion have occurred since her metoprolol was increased to 75 mg twice a day.  Allergies -- Clindamycin/Lincomycin -- Anaphylaxis and Shortness Of Breath  -- Penicillins -- Anaphylaxis, Hives and Rash   --  Childhood allergy  -- Gentamycin [Gentamicin] -- Hives, Itching and Rash   --  Reaction noted post loop recorder implant, where            gentamycin was used for irrigation. Topical prep  -- Tizanidine -- Other (See Comments)   --  Hallucinate  -- Atorvastatin -- Other (See Comments)   --    increased LFT's  -- Avandia [Rosiglitazone Maleate]    --  Congestive heart failure  -- Codeine -- Nausea And Vomiting  -- Crestor [Rosuvastatin] -- Other (See Comments)   --  Muscle and joint pain &  -- Erythromycin -- Nausea And Vomiting  -- Metformin    --  Congestive heart failure  -- Novolog [Insulin Aspart] -- Itching   --  Patient reports she has severe itching with            Novolog insulin  -- Plavix [Clopidogrel Bisulfate]    --  P2Y12 testing = 271 while on Plavix  -- Plavix [Clopidogrel]    --  Does not work for patient  -- Pravastatin -- Other (See Comments)   --  In high doses (20-38m) causes muscle and joint            pain  -- Simvastatin    --  Increased LFT's  -- Tamiflu [Oseltamivir] -- Nausea And Vomiting  -- Cephalexin -- Swelling and Rash   --  Extremities swell  -- Iodinated Diagnostic Agents -- Other (See Comments)   --  PAIN DURING LYMPHANGIOGRAM '86, NO ALLERGY TO IV            CONTRAST  -- Neosporin [Neomycin-Bacitracin Zn-Polymyx] -- Rash  Current Outpatient Prescriptions on File Prior to Visit: ALPRAZolam (XANAX) 0.5 MG tablet, Take 0.25 mg by mouth 2 (two) times daily as needed. , Disp: , Rfl:  aminocaproic acid (  AMICAR) 25 % solution, Take 10 ml and hold in mouth x 2 minutes then spit every 6 hours x 2 days prn gums bleeding post dental cleaning., Disp: 400 mL, Rfl: 0 APIDRA 100 UNIT/ML injection, Take 150-180 units daily (pump), Disp: , Rfl:   aspirin EC 81 MG tablet, Take 81 mg by mouth at bedtime., Disp: , Rfl:  Calcium Carbonate-Vitamin D (RA CALCIUM PLUS VITAMIN D) 600-400 MG-UNIT per tablet, Take 1 tablet by mouth 2 (two) times daily. , Disp: , Rfl:  cholecalciferol (VITAMIN D) 1000 UNITS tablet, Take 1,000 Units by mouth 2 (two) times daily. , Disp: , Rfl:  desoximetasone (TOPICORT) 0.05 % cream, Apply 1 application topically 2 (two) times daily as needed. When skin starts peeling, Disp: , Rfl:  docusate sodium (COLACE) 100 MG capsule, Take  1 capsule (100 mg total) by mouth 2 (two) times daily., Disp: 60 capsule, Rfl: 3 fenofibrate (TRICOR) 145 MG tablet, TAKE ONE (1) TABLET EACH DAY, Disp: 30 tablet, Rfl: 5 ferrous fumarate (HEMOCYTE - 106 MG FE) 325 (106 FE) MG TABS tablet, Take 1 tablet by mouth 2 (two) times a week. Take with Vitamin C tablet, Disp: , Rfl:  fluticasone (FLONASE) 50 MCG/ACT nasal spray, Place 2 sprays into the nose daily as needed for rhinitis. For nasal congestion, Disp: , Rfl:  furosemide (LASIX) 40 MG tablet, Take 1.5 tablets (60 mg total) by mouth daily., Disp: 90 tablet, Rfl: 11 glucagon (GLUCAGON EMERGENCY) 1 MG injection, Inject 1 mg into the vein once as needed (for severe reaction)., Disp: , Rfl:  Insulin Human (INSULIN PUMP) 100 unit/ml SOLN, Inject into the skin continuous. ApidraBasal Rate: [id number], Disp: , Rfl:  isosorbide mononitrate (IMDUR) 30 MG 24 hr tablet, Take 1 tablet (30 mg total) by mouth at bedtime., Disp: , Rfl:  Magnesium 250 MG TABS, Take 250 mg by mouth daily. , Disp: , Rfl:  Multiple Vitamins-Minerals (MULTIVITAMIN WITH MINERALS) tablet, Take 1 tablet by mouth daily.  , Disp: , Rfl:  nitroGLYCERIN (NITROLINGUAL) 0.4 MG/SPRAY spray, Place 1 spray under the tongue every 5 (five) minutes as needed for chest pain., Disp: , Rfl:  omeprazole (PRILOSEC OTC) 20 MG tablet, Take 20 mg by mouth daily., Disp: , Rfl:  polyethylene glycol (MIRALAX / GLYCOLAX) packet, Take 8.5 g by mouth daily as needed. For constipation, Disp: , Rfl:  potassium chloride SA (K-DUR,KLOR-CON) 20 MEQ tablet, TAKE TWO TABLETS BY MOUTH DAILY, Disp: 60 tablet, Rfl: 3 pravastatin (PRAVACHOL) 10 MG tablet, TAKE ONE (1) TABLET BY MOUTH EVERY DAY, Disp: 90 tablet, Rfl: 1 Ticagrelor (BRILINTA) 90 MG TABS tablet, Take 1 tablet (90 mg total) by mouth 2 (two) times daily., Disp: 60 tablet, Rfl: 6 vitamin C (ASCORBIC ACID) 500 MG tablet, Take 250 mg by mouth daily. , Disp: , Rfl:   ZETIA 10 MG tablet, TAKE ONE TABLET BY MOUTH EVERY NIGHT AT BEDTIME, Disp: 90 tablet, Rfl: 3  No current facility-administered medications on file prior to visit.   Past Medical History:   Aortic stenosis                                                Comment:a. bicuspid aortic valve; mean gradient of 20               mmHg in 2/10; b. s/p bioprosth AVR (19-mm  Edwards pericardial valve) with a redo coronary              artery bypass graft procedure in 07/2009;               postoperative Dressler's syndrome     Coronary artery disease                                        Comment:a. s/p RCA saphenous vein graft in 06/1999; b.               multiple preceding PCIs with restenosis; c.               total obstruction of RCA in 2006 with patent               graft; d. 07/1999 Redo VG->RCA;  e. 03/2013               Cath/PCI: LM 80ost/d (2.75x24 Promus Premier               DES), LAD 80ost, LCx nl, RCA 100, VG->PDA ok.   Chronic diastolic CHF (congestive heart failur*                Comment:a. 9.2014 EchoP EF 60-65%, no rwma, bioprosth               AVR, mean gradient of 47mHg, mild to mod MR,               PASP 566mg.   Hyperlipidemia                                               Hypertension                                                 Small bowel obstruction                                        Comment:Recurrent; resolved after resection of a lipoma   Osteopenia                                                     Comment: hip on DEXA in October 2007.   Anemia                                                       Carcinoma, renal cell                           05/2007        Comment:Laparoscopic right nephrectomy   NASH (nonalcoholic steatohepatitis)             1999  Comment:-biopsy in 1999   Diabetes mellitus                               03/2010         Comment: TYPE II: Hemoglobin A1c of 7.4 in  03/2010; 8.4              in 06/2010; treated with  insulin pump   Migraines                                                    Duodenal ulcer                                                 Comment:Remote; H. pylori positive   Colitis, ischemic                               2008         Gastroesophageal reflux disease                                Comment:? of GASTROPARESIS; IRRITABLE BOWEL SYNDROME   Hodgkin's disease                               1991           Comment:Mantle radiation therapy   Exertional dyspnea                                             Comment:chest discomfort   Statin intolerance                                           Hypercalcemia                                                  Comment:Ca-11.1 in 07/2011   Vitamin D deficiency                                         Costochondritis                                              Insomnia  Iron deficiency anemia                                         Comment:a. 9.2014 EGD: essentially normal.   Gastroparesis due to DM                         05/01/2012   PONV (postoperative nausea and vomiting)                     Hyperplastic gastric polyp                      06/23/2012    Arthritis                                                    Osteoporosis                                                 Pacemaker                                       08/21/2012     Comment:Medtronic Adapta L dual-chamber pacemaker-Dr.               Lovena Le   Anxiety                                                     Past Surgical History:   TOTAL ABDOMINAL HYSTERECTOMY W/ BILATERAL SALP*  2000         BREAST EXCISIONAL BIOPSY                                        Comment:benign, bilateral   NEPHRECTOMY                                      2008           Comment:Laparoscopic right nephrectomy, renal cell               carcinoma   CORONARY ARTERY BYPASS GRAFT                     2005, 2011     Comment:CABG-most recent SVG to  RCA-12/05; RCA occluded              with patent graft in 2006; Redo bypass in               07/2009   TUMOR EXCISION  1981           Comment:removal of Hodgkins lymphoma   EXPLORATORY LAPAROTOMY W/ BOWEL RESECTION                       Comment:Small bowel resection of lipoma   AORTIC VALVE REPLACEMENT                         2011           Comment:Aortic valve replacement surgery with a 19 mm               bioprosthetic device post redo CABG January               2011.   COLONOSCOPY                                                     Comment:Multiple with adenomatous polyps   CERVICAL BIOPSY                                               LIVER BIOPSY                                                  BONE MARROW BIOPSY                                            ESOPHAGOGASTRODUODENOSCOPY                                    ESOPHAGOGASTRODUODENOSCOPY                       06/23/2012      Comment:Procedure: ESOPHAGOGASTRODUODENOSCOPY (EGD);                Surgeon: Gatha Mayer, MD;  Location: [facility]               ENDOSCOPY;  Service: Endoscopy;  Laterality:               N/A;   COLONOSCOPY                                      06/23/2012      Comment:Procedure: COLONOSCOPY;  Surgeon: Gatha Mayer, MD;  Location: [facility] ENDOSCOPY;  Service:              Endoscopy;  Laterality: N/A;   MALONEY DILATION                                 06/23/2012      Comment:Procedure: MALONEY DILATION;  Surgeon: Gatha Mayer, MD;  Location: [facility];  Service:              Endoscopy;;  54 fr   PACEMAKER INSERTION                              08/21/2012      Comment:Medtronic Adapta L dual-chamber pacemaker   ESOPHAGOGASTRODUODENOSCOPY                      N/A 04/06/2013      Comment:Procedure: ESOPHAGOGASTRODUODENOSCOPY (EGD);                Surgeon: Irene Shipper, MD;  Location: [facility]               ENDOSCOPY;  Service: Endoscopy;  Laterality:                N/A;   GIVENS CAPSULE STUDY                            N/A 04/30/2013      Comment:Procedure: GIVENS CAPSULE STUDY;  Surgeon: Gatha Mayer, MD;  Location: [facility];                Service: Endoscopy;  Laterality: N/A;  Review of patient's family history indicates:   Heart attack                   Mother                   Heart disease                  Mother                   Heart attack                   Father                   Diabetes                       Father                   Heart disease                  Father                   Hypertension                   Brother                  Glaucoma                       Brother                  Lung cancer                    Sister                     Comment: meta   Breast cancer  Sister                     Comment: half sister (Paternal)   Ovarian cancer                 Sister                   Cervical cancer                Sister                     Comment: half sister (maternal)   Breast cancer                  Sister                   Colon cancer                   Neg Hx                   Stomach cancer                 Neg Hx                   Lung cancer                    Sister                     Comment: half sister (maternal)   Social History   Marital Status: Married             Spouse Name:                      Years of Education:                 Number of children: 2           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 She follows up with her oncologist on Monday. She is going to [facility] for gastroparesis.   PHYSICAL EXAM: Well-nournished, in no acute distress. Neck: No JVD, HJR, Bruit,  or thyroid enlargement  Lungs: Decreased breath sounds at the right lung base otherwise No tachypnea, clear without wheezing, rales, or rhonchi  Cardiovascular: RRR, PMI not displaced, positive S4 and 6-8/4 systolic murmur at the left sternal border, crisp valve sounds, no bruit, thrill, or heave.  Abdomen: BS normal. Soft without organomegaly, masses, lesions or tenderness.  Extremities: without cyanosis, clubbing or edema. Good distal pulses bilateral  SKin: Warm, no lesions or rashes   Musculoskeletal: No deformities  Neuro: no focal signs  BP 127/68  Pulse 76  Ht 5' 3"  (1.6 m)  Wt 124 lb 1.9 oz (56.3 kg)  BMI 21.99 kg/m2   EKG: normal sinus rhythm with first degree AV block LVH

## 2013-10-01 NOTE — Assessment & Plan Note (Signed)
Check CBC stat to make sure she does not need another blood transfusion. Could be contributing to her increase in angina. Blood transfusions are typically done by Dr.Livesay's office.

## 2013-10-02 ENCOUNTER — Encounter (HOSPITAL_COMMUNITY)
Admission: RE | Admit: 2013-10-02 | Discharge: 2013-10-02 | Disposition: A | Discharge status: Home / Self Care | Payer: BC Managed Care – PPO | Source: Ambulatory Visit | Attending: Cardiology | Admitting: Cardiology

## 2013-10-03 ENCOUNTER — Other Ambulatory Visit: Payer: Self-pay | Admitting: Oncology

## 2013-10-05 ENCOUNTER — Encounter: Payer: Self-pay | Admitting: Oncology

## 2013-10-05 ENCOUNTER — Encounter (HOSPITAL_COMMUNITY): Payer: BC Managed Care – PPO

## 2013-10-05 ENCOUNTER — Ambulatory Visit (HOSPITAL_BASED_OUTPATIENT_CLINIC_OR_DEPARTMENT_OTHER): Payer: BC Managed Care – PPO | Admitting: Oncology

## 2013-10-05 ENCOUNTER — Telehealth: Payer: Self-pay | Admitting: *Deleted

## 2013-10-05 ENCOUNTER — Other Ambulatory Visit (HOSPITAL_BASED_OUTPATIENT_CLINIC_OR_DEPARTMENT_OTHER): Payer: BC Managed Care – PPO

## 2013-10-05 ENCOUNTER — Other Ambulatory Visit: Payer: Self-pay | Admitting: *Deleted

## 2013-10-05 VITALS — BP 133/49 | HR 75 | Temp 98.6°F | Resp 18 | Ht 63.0 in | Wt 123.6 lb

## 2013-10-05 DIAGNOSIS — D509 Iron deficiency anemia, unspecified: Secondary | ICD-10-CM

## 2013-10-05 DIAGNOSIS — Z794 Long term (current) use of insulin: Secondary | ICD-10-CM

## 2013-10-05 DIAGNOSIS — E1349 Other specified diabetes mellitus with other diabetic neurological complication: Secondary | ICD-10-CM

## 2013-10-05 DIAGNOSIS — Z87898 Personal history of other specified conditions: Secondary | ICD-10-CM

## 2013-10-05 DIAGNOSIS — N289 Disorder of kidney and ureter, unspecified: Secondary | ICD-10-CM

## 2013-10-05 DIAGNOSIS — E890 Postprocedural hypothyroidism: Secondary | ICD-10-CM

## 2013-10-05 DIAGNOSIS — I2510 Atherosclerotic heart disease of native coronary artery without angina pectoris: Secondary | ICD-10-CM

## 2013-10-05 DIAGNOSIS — K3184 Gastroparesis: Secondary | ICD-10-CM

## 2013-10-05 DIAGNOSIS — D500 Iron deficiency anemia secondary to blood loss (chronic): Secondary | ICD-10-CM

## 2013-10-05 LAB — CBC WITH DIFFERENTIAL/PLATELET
BASO%: 1 % (ref 0.0–2.0)
BASOS ABS: 0.1 10*3/uL (ref 0.0–0.1)
EOS%: 2.2 % (ref 0.0–7.0)
Eosinophils Absolute: 0.2 10*3/uL (ref 0.0–0.5)
HEMATOCRIT: 36.7 % (ref 34.8–46.6)
HEMOGLOBIN: 12.2 g/dL (ref 11.6–15.9)
LYMPH#: 1.4 10*3/uL (ref 0.9–3.3)
LYMPH%: 17.7 % (ref 14.0–49.7)
MCH: 30.3 pg (ref 25.1–34.0)
MCHC: 33.2 g/dL (ref 31.5–36.0)
MCV: 91.4 fL (ref 79.5–101.0)
MONO#: 0.5 10*3/uL (ref 0.1–0.9)
MONO%: 6.5 % (ref 0.0–14.0)
NEUT#: 5.5 10*3/uL (ref 1.5–6.5)
NEUT%: 72.6 % (ref 38.4–76.8)
PLATELETS: 140 10*3/uL — AB (ref 145–400)
RBC: 4.01 10*6/uL (ref 3.70–5.45)
RDW: 14.3 % (ref 11.2–14.5)
WBC: 7.6 10*3/uL (ref 3.9–10.3)

## 2013-10-05 NOTE — Progress Notes (Signed)
OFFICE PROGRESS NOTE   10/05/2013   Physicians:W.McGough, [name] (endocrine [facility]), [name], D.McLean, G.Lovena Le, B.Bartle, A.Deal (GI [facility])   INTERVAL HISTORY:  Patient is seen, alone for visit, in follow up of previous anemia thought related to intermittent GI blood loss, this is setting of multiple significant comorbidities.  Despite increase in angina beginning 09-09-13, and ~ 2 dark stools in past month, hemoglobin was 12.6 by cardiology on 10-01-13 and is 12.2 today. She did not tolerate increase in metoprolol following episode of angina on 2-18, and is still not quite back to baseline at cardiac rehab since then. She is to see [name] this week. She continues Brilinta and ASA, this necessary long term due to CAD with left main/ LAD stent. Only definite bleeding has been with changing location of insulin pump, "a handful" of blood with needle deaccess each time.   She did not have liver biopsy at [facility] due to [place], did have doppler US with confirmed cirrhosis. She has gastroparesis documented by gastric emptying study 09-21-13 and has been referred to Dr Lacinda Axon at [facility]. She follows with Dr Frederik Pear of endocrinology [facility] next in May (q 3 months). She is for thyroid US at [facility] also in May.   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 She has radiation induced hypothyroidism and the treatment caused or worsened coronary artery disease. She has had no further active Hodgkins.  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 She is no longer followed regularly by urology.   Significant comorbid conditions include ACD with  prior CABG then redo CABG, aortic stenosis post aortic valve 2011, SSS with pacer, insulin dependent diabetes now followed by Dr [name] at [facility] and on insulin pump, hypothyroidism related to RT for Hodgkins, diagnosis of ischemic colitis with rectal bleeding during past year, and some renal insufficiency with single kidney.Patient was admitted to [facility] from 04-03-13 thru 04-11-13 with progressive dyspnea, orthopnea, unstable angina and Hgb of 7.3 on day of admission, this following outpatient evaluation with cardiopulmonary exercise test with evidence of ischemia. She ruled out for MI. Stool was heme + by GI in hospital; she was transfused 1 unit of PRBCs on 04-03-13, given over 4 hours and note from those MDs that she should have lasix with further transfusions. She began oral iron, did not tolerate this bid.. EGD 04-06-13 was unremarkable including duodenal biopsy. She had cardiac cath on 04-07-13 with high grade LM and proximal LAD involvement, and diffuse calcific plaque in aorta. CVTS (Dr Cyndia Bent) felt that she was not a candidate for a 3rd re-do surgical procedure particularly due to diffuse aortic calcification. She had PTCA of left main with stent into ostium of LM into LAD by Dr Angelena Form on 04-09-13; per cardiology she will need lifelong dual antiplatelet therapy.     Review of systems as above, also: Blood sugars still variable, possibly also affected by the gastroparesis. Had flu despite flu vaccine. No chest pain now, tho she has had several more mild angina episodes since 2-18. Remainder of 10 point Review of Systems negative.  Objective:  Vital signs in last 24 hours: Weight down 0.5 lbs to 123 lb 9 oz.  BP 133/49, HR 75  regular, resp 18, temp 98.6  Alert, oriented and appropriate. Ambulatory without difficulty.    HEENT:PERRL, sclerae not icteric. Oral mucosa moist without lesions, posterior pharynx clear.  Neck supple. No JVD.  Lymphatics:no cervical,suraclavicular, axillary  adenopathy Resp: clear to auscultation bilaterally and normal percussion bilaterally Cardio: regular rate and rhythm. No gallop. GI: soft, nontender, not distended, no mass or organomegaly. Some bowel sounds. Insulin pump infusing. Musculoskeletal/ Extremities: without pitting edema, cords, tenderness Neuro: nonfocal. Psych: appropriate mood and affect Skin without rash, ecchymosis, petechiae including around insulin pump   Lab Results:  Results for orders placed in visit on 10/05/13  CBC WITH DIFFERENTIAL      Result Value Ref Range   WBC 7.6  3.9 - 10.3 10e3/uL   NEUT# 5.5  1.5 - 6.5 10e3/uL   HGB 12.2  11.6 - 15.9 g/dL   HCT 36.7  34.8 - 46.6 %   Platelets 140 (*) 145 - 400 10e3/uL   MCV 91.4  79.5 - 101.0 fL   MCH 30.3  25.1 - 34.0 pg   MCHC 33.2  31.5 - 36.0 g/dL   RBC 4.01  3.70 - 5.45 10e6/uL   RDW 14.3  11.2 - 14.5 %   lymph# 1.4  0.9 - 3.3 10e3/uL   MONO# 0.5  0.1 - 0.9 10e3/uL   Eosinophils Absolute 0.2  0.0 - 0.5 10e3/uL   Basophils Absolute 0.1  0.0 - 0.1 10e3/uL   NEUT% 72.6  38.4 - 76.8 %   LYMPH% 17.7  14.0 - 49.7 %   MONO% 6.5  0.0 - 14.0 %   EOS% 2.2  0.0 - 7.0 %   BASO% 1.0  0.0 - 2.0 %     Studies/Results:  No results found.  Medications: I have reviewed the patient's current medications.  DISCUSSION: She knows that she can call at any time prior to scheduled visit if she seems more symptomatic from anemia or if increased bleeding.   Assessment/Plan: 1.intermittent anemia related to intermittent GI bleeding in patient on necessary long term ticagrelor and asa, post IV iron 05-2013 and continuing po iron. Hemoglobin down slightly from Dec, but still >12 so will continue to follow. I will recheck hgb in ~ 4 mo if not done by other MDs around then, or sooner if needed. 2.IIA nodular sclerosing Hodgkins treated with mantle radiation in 1980s.  3.post right nephrectomy for renal cell carcinoma 2008, not known recurrent.UA without blood.  4.Cardiac disease:  CAD post RCA vein graft and redo CABG 2011, aortic stenosis post aortic valve 2011, sinus node dysfunction with brief episodes PAF, dual chamber pacer Jan 2014. Now post stent to LM into proximal LAD for progressive involvement there, not candidate for another surgery per CVTS.  5. Insulin dependent diabetes: followed by Dr [name] at [facility], on insulin pump.  6.hypothyroidism post RT, on replacement, Dr Frederik Pear also following thyroid US 7.rectal bleeding ~ 12-2012 thought from ischemic colitis. Post colonoscopy 06-2012 with no polyps.  8.renal insufficiency: single kidney, creatinines 1.3 - 1.4 in late Sept, with additional diuresis done during that hospitalization 9.gastroparesis related to DM, as above.     Patient is comfortable with discussion and plan       Adriona Kaney P, MD   10/05/2013, 10:09 AM

## 2013-10-05 NOTE — Telephone Encounter (Signed)
appts made and printed. Pt is aware that i printed the pof and placed it in the file to schedule for LL....td

## 2013-10-07 ENCOUNTER — Encounter (HOSPITAL_COMMUNITY)
Admission: RE | Admit: 2013-10-07 | Discharge: 2013-10-07 | Disposition: A | Discharge status: Home / Self Care | Payer: BC Managed Care – PPO | Source: Ambulatory Visit | Attending: Cardiology | Admitting: Cardiology

## 2013-10-07 DIAGNOSIS — G589 Mononeuropathy, unspecified: Secondary | ICD-10-CM

## 2013-10-08 ENCOUNTER — Encounter: Payer: Self-pay | Admitting: Cardiology

## 2013-10-08 ENCOUNTER — Encounter: Payer: Self-pay | Admitting: *Deleted

## 2013-10-08 ENCOUNTER — Ambulatory Visit (INDEPENDENT_AMBULATORY_CARE_PROVIDER_SITE_OTHER): Payer: BC Managed Care – PPO | Admitting: Cardiology

## 2013-10-08 VITALS — BP 118/64 | HR 84 | Ht 63.0 in | Wt 124.0 lb

## 2013-10-08 DIAGNOSIS — I4891 Unspecified atrial fibrillation: Secondary | ICD-10-CM

## 2013-10-08 DIAGNOSIS — I2510 Atherosclerotic heart disease of native coronary artery without angina pectoris: Secondary | ICD-10-CM

## 2013-10-08 DIAGNOSIS — I5032 Chronic diastolic (congestive) heart failure: Secondary | ICD-10-CM

## 2013-10-08 DIAGNOSIS — E785 Hyperlipidemia, unspecified: Secondary | ICD-10-CM

## 2013-10-08 DIAGNOSIS — I359 Nonrheumatic aortic valve disorder, unspecified: Secondary | ICD-10-CM

## 2013-10-08 DIAGNOSIS — I509 Heart failure, unspecified: Secondary | ICD-10-CM

## 2013-10-08 DIAGNOSIS — I25810 Atherosclerosis of coronary artery bypass graft(s) without angina pectoris: Secondary | ICD-10-CM

## 2013-10-08 DIAGNOSIS — I209 Angina pectoris, unspecified: Secondary | ICD-10-CM

## 2013-10-08 DIAGNOSIS — I350 Nonrheumatic aortic (valve) stenosis: Secondary | ICD-10-CM

## 2013-10-08 MED ORDER — FUROSEMIDE 40 MG PO TABS: ORAL_TABLET | ORAL | Status: DC

## 2013-10-08 MED ORDER — ISOSORBIDE MONONITRATE ER 60 MG PO TB24: 60.0000 mg | ORAL_TABLET | Freq: Every day | ORAL | Status: DC

## 2013-10-08 NOTE — Patient Instructions (Signed)
Increase lasix (furosemide) to 65m daily. This will be 2 of your 411mtablets daily in the AM.  Increase Imdur(isosorbide) to 6051maily. You can take 2 of your 37m54mblets daily at the same time.   Your physician has requested that you have en exercise stress myoview. For further information please visit [email or web address] follow instruction sheet, as given.  Your physician recommends that you return for lab work in: 2 weeks when you see Dr McLeSallyanne Kusterour physician recommends that you schedule a follow-up appointment in: 2 weeks with Dr McLeAundra Dubin

## 2013-10-09 ENCOUNTER — Encounter (HOSPITAL_COMMUNITY)
Admission: RE | Admit: 2013-10-09 | Discharge: 2013-10-09 | Disposition: A | Discharge status: Home / Self Care | Payer: BC Managed Care – PPO | Source: Ambulatory Visit | Attending: Cardiology | Admitting: Cardiology

## 2013-10-10 NOTE — Progress Notes (Signed)
Patient ID: [name] [name], female   DOB: 1950/11/28, 63 y.o.   MRN: [id number] PCP: Dr. Orson Ape  63 yo with history of CAD s/p CABG in 12/05 and redo in 1/11, bioprosthetic AVR, diastolic CHF, and sick sinus syndrome s/p PCM presents for cardiology followup.  She had initial CABG with SVG-RCA in 12/05 followed by redo SVG-RCA and bioprosthetic AVR in 1/11.  Last echo in 9/14 showed normal EF with well-seated aortic valve, mild to moderate MR, and PA systolic pressure 51 mmHg.  She has had diastolic CHF and is on Lasix.  In 9/14, she had a CPX test.  This showed severe functional limitation with ischemic ECG changes (inferolateral ST depression and ST elevation in V1 and V2). She had chest pain.  I took her for [facility] in 9/14.  This showed elevated left and right heart filling pressures, patent SVG-PDA, and 80% distal LM/80% ostial LAD stenosis.  Patient was deemed not a candidate for redo CABG (had 2 prior sternotomies as well as chest radiation for Hodgkins lymphoma).  She had DES from left main into proximal LAD and will need long-term DAPT => Brilinta was used as she has been a poor responder to Plavix in the past.    She has chronic iron deficiency anemia thought to be due to a slow GI bleed.  Last EGD was unremarkable and a capsule endoscopy was also unremarkable.  She has periodic transfusions; However, recently hemoglobin has been stable.   She has an insulin pump followed by an endocrinologist at [facility].   Patient is on a very low dose of pravastatin.  She has been unable to tolerate multiple statins or higher pravastatin dose due to myalgias.    At last appointment with me, she was doing well.  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  The pain lasted 20 minutes and resolved after she took NTG x 3.  She felt some tachypalpitations with this episode, only rarely since then.  Since then, she has noted chest pain with  exercise, usually when her heart rate rises to the 120s or more.  She has also noted more exertional dyspnea such as when walking up hills or walking fast.  Weight is up 3 lbs.    Labs (7/14): hemoglobin 11 Labs (9/14): K 3.9, creatinine 1.48, hemoglobin 7.3 Labs (10/14): hemoglobin 7.7 Labs (11/14): K 3.8, creatinine 1.5 Labs (12/14): HCT 37.7 Labs (1/15): LDL 106, HDL 41, K 3.9, creatinine 1.6, LFTs normal Labs (2/15): K 3.7, creatinine 1.1, BNP 71 Labs (3/15): HCT 36.7  PMH: 1. Sick sinus syndrome s/p [employer] PCM.  2. HTN 3. H/o SBO 4. Renal cell carcinoma s/p right nephrectomy in 2008.  5. NASH 6. Diastolic CHF: Echo (5/18) with EF 60-65%, bioprosthetic aortic valve with mean gradient 15 mmHg, mild MR.  CPX (9/14): peak VO2 10.4, VE/VCO2 43.8, inferolateral ST depression and ST elevation in V1/V2 with severe dyspnea and chest pain, severe functional limitation with ischemic changes.  Echo (9/14) with EF 60-65%, normal RV size and systolic function, mild-moderate MR, bioprosthetic aortic valve normal, PA systolic pressure 51 mmHg, no evidence for pericardial constriction though exam incomplete for constriction.  7. TAH-BSO 8. Type II diabetes: Has insulin pump.  9. H/o ischemic colitis. 10. H/o PUD. 11. Diabetic gastroparesis. 12. Hyperlipidemia: Myalgias with Crestor and atorvastatin, elevated LFTs with simvastatin.  Myalgias with > 10 mg pravastatin.  13. CKD 14. Bicuspid aortic  valve s/p 19 mm Edwards pericardial valve in 1/11 (had post-operative Dresslers syndrome).   15. CAD: CABG with SVG-RCA in 12/05.  Redo SVG-RCA in 1/11 with AVR.  LHC/RHC (9/14) with mean RA 18, PA 62/25 mean 43, mean PCWP 26, CI 2.8, 70-80% distal LM stenosis, 80% ostial LAD stenosis, total occlusion RCA, SVG-PDA patent.  Patient had DES LM into LAD.  Plan to continue long-term ASA/Brilinta (poor responder to Plavix).  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  17.  Anemia: Iron-deficiency.  Suspect chronic GI blood loss.  EGD in 9/14 was unremarkable.  Capsule endoscopy 10/14 was unremarkable.  Has required periodic blood transfusions.  18. Atrial fibrillation: Paroxysmal, noted by [facility] interrogation.  19. Carotid dopplers (2/15) with 40-59% bilateral stenosis.   SH: Married, lives in [place], nonsmoker.   FH: CAD  ROS: All systems reviewed and negative except as per HPI.   Current Outpatient Prescriptions  Medication Sig Dispense Refill  . ALPRAZolam (XANAX) 0.5 MG tablet Take 0.25 mg by mouth 2 (two) times daily as needed.       Marland Kitchen aminocaproic acid (AMICAR) 25 % solution Take 10 ml and hold in mouth x 2 minutes then spit every 6 hours x 2 days prn gums bleeding post dental cleaning.  400 mL  0  . aspirin EC 81 MG tablet Take 81 mg by mouth at bedtime.      . Calcium Carbonate-Vitamin D (RA CALCIUM PLUS VITAMIN D) 600-400 MG-UNIT per tablet Take 1 tablet by mouth 2 (two) times daily.       . cholecalciferol (VITAMIN D) 1000 UNITS tablet Take 1,000 Units by mouth 2 (two) times daily.       Marland Kitchen desoximetasone (TOPICORT) 0.05 % cream Apply 1 application topically 2 (two) times daily as needed. When skin starts peeling      . docusate sodium (COLACE) 100 MG capsule Take 1 capsule (100 mg total) by mouth 2 (two) times daily.  60 capsule  3  . fenofibrate (TRICOR) 145 MG tablet TAKE ONE (1) TABLET EACH DAY  30 tablet  5  . ferrous fumarate (HEMOCYTE - 106 MG FE) 325 (106 FE) MG TABS tablet Take 1 tablet by mouth 2 (two) times a week. Take with Vitamin C tablet      . fluticasone (FLONASE) 50 MCG/ACT nasal spray Place 2 sprays into the nose daily as needed for rhinitis. For nasal congestion      . glucagon (GLUCAGON EMERGENCY) 1 MG injection Inject 1 mg into the vein once as needed (for severe reaction).      . Insulin Human (INSULIN PUMP) 100 unit/ml SOLN Inject into the skin continuous. Apidra Basal Rate midnight-400 : 0.3 400-700: 0.25 700-830: 2.5 830-  1230: 3.0 1230-1900: 2.5 1900-2400: 3.2      . Magnesium 250 MG TABS Take 250 mg by mouth daily.       . metoprolol (LOPRESSOR) 50 MG tablet 1 TAB TWICE DAILY      . Multiple Vitamins-Minerals (MULTIVITAMIN WITH MINERALS) tablet Take 1 tablet by mouth daily.        . nitroGLYCERIN (NITROLINGUAL) 0.4 MG/SPRAY spray Place 1 spray under the tongue every 5 (five) minutes as needed for chest pain.      Marland Kitchen omeprazole (PRILOSEC OTC) 20 MG tablet Take 20 mg by mouth daily.      . polyethylene glycol (MIRALAX / GLYCOLAX) packet Take 8.5 g by mouth daily as needed. For constipation      .  potassium chloride SA (K-DUR,KLOR-CON) 20 MEQ tablet TAKE TWO TABLETS BY MOUTH DAILY  60 tablet  3  . pravastatin (PRAVACHOL) 10 MG tablet TAKE ONE (1) TABLET BY MOUTH EVERY DAY  90 tablet  1  . Ticagrelor (BRILINTA) 90 MG TABS tablet Take 1 tablet (90 mg total) by mouth 2 (two) times daily.  60 tablet  6  . vitamin C (ASCORBIC ACID) 500 MG tablet Take 250 mg by mouth daily.       Marland Kitchen ZETIA 10 MG tablet TAKE ONE TABLET BY MOUTH EVERY NIGHT AT BEDTIME  90 tablet  3  . furosemide (LASIX) 40 MG tablet 2 tablets (32m) daily  60 tablet  3  . isosorbide mononitrate (IMDUR) 60 MG 24 hr tablet Take 1 tablet (60 mg total) by mouth daily.  30 tablet  3   No current facility-administered medications for this visit.    BP 118/64  Pulse 84  Ht 5' 3"  (1.6 m)  Wt 56.246 kg (124 lb)  BMI 21.97 kg/m2 General: NAD Neck: JVP 8 cm, no thyromegaly or thyroid nodule.  Lungs: Clear to auscultation bilaterally with normal respiratory effort. CV: Nondisplaced PMI.  Heart regular S1/S2, no ST3/S2 2/6 systolic murmur RUSB.  No peripheral edema.  Left carotid bruit.  2+ PT pulses bilaterally.  Abdomen: Soft, nontender, no hepatosplenomegaly, no distention.  Skin: Intact without lesions or rashes.  Neurologic: Alert and oriented x 3.  Psych: Normal affect. Extremities: No clubbing or cyanosis.   Assessment/Plan: 1. CAD: s/p SVG-RCA in  12/05, then redo SVG-RCA in 1/11.  She was found to have distal left main/proximal LAD severe stenosis in 9/14.  She was very symptomatic.  PCI with DES to distal LM/proximal LAD was done rather than bypass as she would have been a poor candidate for redo sternotomy (2 prior sternotomies and history of chest wall radiation for Hodgkins disease).  She will need to be on Brilinta and ASA (poor responder to plavix) long-term.  This is complicated by suspected slow GI bleed. So far, this has been controlled with periodic transfusions (hemoglobin has been stable recently). Recently, she has been having more exertional chest pain.  She was not significantly anemic on recent CBC. - Continue ASA 81, statin, metoprolol, Brilinta.   - Increase Imdur to 60 mg daily. - Will arrange ETT-Cardiolite 2. Bioprosthetic aortic valve replacement: Stable on last echo.  3. Diastolic CHF: Elevated left and right heart filling pressures on RHC prior to recent coronary intervention.  Increased dyspnea, weight is up.  - Increase Lasix to 80 mg daily.  - BMET/BNP in 2 wks.   4. CKD:  Patient has a single kidney s/p right nephrectomy.  She has been referred to nephrology. 5. Hyperlipidemia: She has not been able to tolerate any higher potency statin than pravastatin 10 mg daily.  She is on Zetia. LDL was 106 in 1/15.  6. Anemia: Suspected slow GI bleed.  Recent unremarkable EGD and capsule endoscopy in fall 2014.  Unfortunately, needs to stay on Brilinta.  Will need to follow closely (sees hematology), plan for transfusion with hemoglobin < 8 as this allows best control of her symptoms. Recent CBC looked good.   7. Atrial fibrillation: Paroxysmal, brief episodes noted on PCM interrogation.  Given GI bleeding and need to be on both ASA 81 and Brilinta, risks likely outweigh benefits of anticoagulation.  8. Carotid bruit: Mild to moderate bilateral stenosis.  Repeat in 1 year (3/16)  Followup in 2 wks.  [name] 10/10/2013

## 2013-10-12 ENCOUNTER — Encounter (HOSPITAL_COMMUNITY)
Admission: RE | Admit: 2013-10-12 | Discharge: 2013-10-12 | Disposition: A | Discharge status: Home / Self Care | Payer: BC Managed Care – PPO | Source: Ambulatory Visit | Attending: Cardiology | Admitting: Cardiology

## 2013-10-12 ENCOUNTER — Telehealth: Payer: Self-pay | Admitting: Cardiology

## 2013-10-12 NOTE — Telephone Encounter (Signed)
Pt.notified

## 2013-10-12 NOTE — Telephone Encounter (Signed)
New message     Pt says imdur is causing her to have a headache and pt is taking brylinta---she has fatty liver disease.  Want to make sure dr knows pt has a liver problem and a side effect from taking this medication is damage to her liver.

## 2013-10-12 NOTE — Telephone Encounter (Signed)
Use Imdur 60 daily, see if HA gets better.   Brilinta ok unless severe liver disease.

## 2013-10-12 NOTE — Telephone Encounter (Signed)
Pt states since increasing Imdur to 65m daily she has developed a headache. It seems to be getting a little better. She is asking if she can take Imdur 311mbid instead of 6056maily to see if that helps her headache or if she should just give 45m46mily a little more time to see if headache improves.   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 After getting Brilinta filled the info from the pharmacy mentioned caution using Brilinta if you have liver disease. She is asking if OK to continue Brilinta.  I will forward to Dr McLeAundra Dubin review.

## 2013-10-14 ENCOUNTER — Encounter (HOSPITAL_COMMUNITY)
Admission: RE | Admit: 2013-10-14 | Discharge: 2013-10-14 | Disposition: A | Discharge status: Home / Self Care | Payer: BC Managed Care – PPO | Source: Ambulatory Visit | Attending: Cardiology | Admitting: Cardiology

## 2013-10-16 ENCOUNTER — Encounter (HOSPITAL_COMMUNITY)
Admission: RE | Admit: 2013-10-16 | Discharge: 2013-10-16 | Disposition: A | Discharge status: Home / Self Care | Payer: BC Managed Care – PPO | Source: Ambulatory Visit | Attending: Cardiology | Admitting: Cardiology

## 2013-10-19 ENCOUNTER — Encounter (HOSPITAL_COMMUNITY): Payer: BC Managed Care – PPO

## 2013-10-20 NOTE — Progress Notes (Signed)
Cardiac Rehabilitation Program Outcomes Report   Orientation:  06/23/2013 Graduate Date:  10/16/2013 Discharge Date:  10/16/2013 # of sessions completed: 36 DX: Angina, Atrial Fib and HTN  Cardiologist: McLean/Taylor Family MD:  McGough Class Time:  09:30  A.  Exercise Program:  Tolerates exercise @ 3.64 METS for 15 minutes and Walk Test Results:  Post: Post Walk Test: Rest HR 71, BP 110/80, O2 99%, RPE 9 and RPD 9, 6 minute HR 106, BP 160/50, O2 95% RPE 13 and RPD 14, Post HR 89, BP 118/60, O2 99%, RPE 9 and RPD 9.  B.  Mental Health:  Good mental attitude  C.  Education/Instruction/Skills  Accurately checks own pulse.  Rest:  79  Exercise:  97, Knows THR for exercise, Uses Perceived Exertion Scale and/or Dyspnea Scale and Attended 13 education classes  Uses Perceived Exertion Scale and/or Dyspnea Scale  D.  Nutrition/Weight Control/Body Composition:  Adherence to prescribed nutrition program: good    E.  Blood Lipids    Lab Results  Component Value Date   CHOL 151 04/04/2013   HDL 29* 04/04/2013   LDLCALC 99 04/04/2013   TRIG 116 04/04/2013   CHOLHDL 5.2 04/04/2013    F.  Lifestyle Changes:  Making positive lifestyle changes and Not smoking:  Quit never smoked  G.  Symptoms noted with exercise:  Asymptomatic  Report Completed By:  Oletta Lamas. Crosby Bevan RN    Comments:  This is patients graduation report. She did very well in rehab. Had several C/O chest pain would resolve with rest. She achieved a peak METS of 3.64. Her resting HR was 79 and resting BP was 124/58, and peak HR was 97 and peak BP was 118/60. A call will be made to patient at 1 month 6 month and 1 year post graduation.  To ensure exercise  Continuance.

## 2013-10-21 ENCOUNTER — Encounter (HOSPITAL_COMMUNITY): Payer: BC Managed Care – PPO

## 2013-10-22 ENCOUNTER — Ambulatory Visit (HOSPITAL_COMMUNITY): Payer: BC Managed Care – PPO | Attending: Cardiology | Admitting: Radiology

## 2013-10-22 ENCOUNTER — Ambulatory Visit (INDEPENDENT_AMBULATORY_CARE_PROVIDER_SITE_OTHER): Payer: BC Managed Care – PPO | Admitting: *Deleted

## 2013-10-22 VITALS — BP 145/71 | HR 82 | Ht 63.0 in | Wt 125.0 lb

## 2013-10-22 DIAGNOSIS — R002 Palpitations: Secondary | ICD-10-CM | POA: Insufficient documentation

## 2013-10-22 DIAGNOSIS — I10 Essential (primary) hypertension: Secondary | ICD-10-CM | POA: Insufficient documentation

## 2013-10-22 DIAGNOSIS — R0989 Other specified symptoms and signs involving the circulatory and respiratory systems: Secondary | ICD-10-CM | POA: Insufficient documentation

## 2013-10-22 DIAGNOSIS — R9431 Abnormal electrocardiogram [ECG] [EKG]: Secondary | ICD-10-CM | POA: Insufficient documentation

## 2013-10-22 DIAGNOSIS — I509 Heart failure, unspecified: Secondary | ICD-10-CM | POA: Insufficient documentation

## 2013-10-22 DIAGNOSIS — R079 Chest pain, unspecified: Secondary | ICD-10-CM

## 2013-10-22 DIAGNOSIS — E119 Type 2 diabetes mellitus without complications: Secondary | ICD-10-CM | POA: Insufficient documentation

## 2013-10-22 DIAGNOSIS — R0609 Other forms of dyspnea: Secondary | ICD-10-CM | POA: Insufficient documentation

## 2013-10-22 DIAGNOSIS — I2510 Atherosclerotic heart disease of native coronary artery without angina pectoris: Secondary | ICD-10-CM | POA: Insufficient documentation

## 2013-10-22 DIAGNOSIS — D509 Iron deficiency anemia, unspecified: Secondary | ICD-10-CM

## 2013-10-22 DIAGNOSIS — I209 Angina pectoris, unspecified: Secondary | ICD-10-CM

## 2013-10-22 DIAGNOSIS — R0789 Other chest pain: Secondary | ICD-10-CM | POA: Insufficient documentation

## 2013-10-22 DIAGNOSIS — Z951 Presence of aortocoronary bypass graft: Secondary | ICD-10-CM | POA: Insufficient documentation

## 2013-10-22 DIAGNOSIS — Z8249 Family history of ischemic heart disease and other diseases of the circulatory system: Secondary | ICD-10-CM | POA: Insufficient documentation

## 2013-10-22 DIAGNOSIS — I495 Sick sinus syndrome: Secondary | ICD-10-CM | POA: Insufficient documentation

## 2013-10-22 LAB — CBC WITH DIFFERENTIAL/PLATELET
BASOS ABS: 0 10*3/uL (ref 0.0–0.1)
Basophils Relative: 0.4 % (ref 0.0–3.0)
Eosinophils Absolute: 0.1 10*3/uL (ref 0.0–0.7)
Eosinophils Relative: 1.4 % (ref 0.0–5.0)
HEMATOCRIT: 35.5 % — AB (ref 36.0–46.0)
Hemoglobin: 11.8 g/dL — ABNORMAL LOW (ref 12.0–15.0)
Lymphocytes Relative: 14.6 % (ref 12.0–46.0)
Lymphs Abs: 1.5 10*3/uL (ref 0.7–4.0)
MCHC: 33.2 g/dL (ref 30.0–36.0)
MCV: 92 fl (ref 78.0–100.0)
MONOS PCT: 5.3 % (ref 3.0–12.0)
Monocytes Absolute: 0.5 10*3/uL (ref 0.1–1.0)
NEUTROS PCT: 78.3 % — AB (ref 43.0–77.0)
Neutro Abs: 7.8 10*3/uL — ABNORMAL HIGH (ref 1.4–7.7)
PLATELETS: 150 10*3/uL (ref 150.0–400.0)
RBC: 3.85 Mil/uL — ABNORMAL LOW (ref 3.87–5.11)
RDW: 14.2 % (ref 11.5–14.6)
WBC: 9.9 10*3/uL (ref 4.5–10.5)

## 2013-10-22 LAB — IBC PANEL
IRON: 53 ug/dL (ref 42–145)
SATURATION RATIOS: 12.5 % — AB (ref 20.0–50.0)
TRANSFERRIN: 302.3 mg/dL (ref 212.0–360.0)

## 2013-10-22 LAB — BASIC METABOLIC PANEL
BUN: 42 mg/dL — ABNORMAL HIGH (ref 6–23)
CO2: 28 mEq/L (ref 19–32)
Calcium: 9.8 mg/dL (ref 8.4–10.5)
Chloride: 102 mEq/L (ref 96–112)
Creatinine, Ser: 1.1 mg/dL (ref 0.4–1.2)
GFR: 53.19 mL/min — AB (ref >60.00)
GLUCOSE: 176 mg/dL — AB (ref 70–99)
POTASSIUM: 3.7 meq/L (ref 3.5–5.1)
Sodium: 141 mEq/L (ref 135–145)

## 2013-10-22 MED ORDER — TECHNETIUM TC 99M SESTAMIBI GENERIC - CARDIOLITE
30.0000 | Freq: Once | INTRAVENOUS | Status: AC | PRN
  Administered 2013-10-22: 30 via INTRAVENOUS

## 2013-10-22 MED ORDER — TECHNETIUM TC 99M SESTAMIBI GENERIC - CARDIOLITE
10.0000 | Freq: Once | INTRAVENOUS | Status: AC | PRN
  Administered 2013-10-22: 10 via INTRAVENOUS

## 2013-10-22 NOTE — Progress Notes (Addendum)
[facility] [address] [place], [place] [address] [phone]    Cardiology Nuclear Med Study  [name] [name] is a 63 y.o. female     MRN : [id number]     DOB: [birth year]  Procedure Date: 10/22/2013  Nuclear Med Background Indication for Stress Test:  Evaluation for Ischemia, Graft Patency and Stent Patency History:  CAD; CABG 2005; MPI 2005-normal, EF 65%; Redo CABG with AVR 2011; Stent 2014; Pacer (SSS/CHF); ECHO 2014-EF 60-65% Cardiac Risk Factors: Family History - CAD, Hypertension, IDDM Type 2 and Lipids  Symptoms:  Chest Pain (last date of chest discomfort was one week ago), DOE and Palpitations   Nuclear Pre-Procedure Caffeine/Decaff Intake:  None NPO After: 9:00pm   Lungs:  clear O2 Sat: 99% on room air. IV 0.9% NS with Angio Cath:  22g  IV Site: R Antecubital  IV Started by:  Mariann Laster Deal, RT-N  Chest Size (in):  36 Cup Size: C  Height: 5' 3"  (1.6 m)  Weight:  125 lb (56.7 kg)  BMI:  Body mass index is 22.15 kg/(m^2). Tech Comments:  Held lopressor 12 hrs    Nuclear Med Study 1 or 2 day study: 1 day  Stress Test Type:  Stress  Reading MD: Kirk Ruths, MD  Order Authorizing Provider:  Einar Crow, MD  Resting Radionuclide: Technetium 59mSestamibi  Resting Radionuclide Dose: 11.0 mCi   Stress Radionuclide:  Technetium 966mestamibi  Stress Radionuclide Dose: 33.0 mCi           Stress Protocol Rest HR: 82 Stress HR: 130  Rest BP: 145/71 Stress BP: 156/62  Exercise Time (min): 6:00 METS: 7.0           Dose of Adenosine (mg):  n/a Dose of Lexiscan: n/a mg  Dose of Atropine (mg): n/a Dose of Dobutamine: n/a mcg/kg/min (at max HR)  Stress Test Technologist: ShGlade LloydBS-ES  Nuclear Technologist:  ToAnnye RuskCNMT     Rest Procedure:  Myocardial perfusion imaging was performed at rest 45 minutes following the intravenous administration of Technetium 9936mstamibi. Rest ECG: NSR, first degree AV block.  Stress Procedure:   The patient exercised on the treadmill utilizing the Bruce Protocol for 6:00 minutes. The patient stopped due to increasing chest discomfort.  Technetium 43m24mtamibi was injected at peak exercise and myocardial perfusion imaging was performed after a brief delay.  Her chest discomfort began to subside approximately two minutes into recovery.  Stress ECG: Significant ST abnormalities consistent with ischemia.  QPS Raw Data Images:  There is interference from nuclear activity from structures below the diaphragm. This does not affect the ability to read the study. Stress Images:  Normal homogeneous uptake in all areas of the myocardium. Rest Images:  Normal homogeneous uptake in all areas of the myocardium. Subtraction (SDS):  No evidence of ischemia. Transient Ischemic Dilatation (Normal <1.22):  1.13 Lung/Heart Ratio (Normal <0.45):  0.34  Quantitative Gated Spect Images QGS EDV:  72 ml QGS ESV:  29 ml  Impression Exercise Capacity:  Fair exercise capacity. BP Response:  Normal blood pressure response. Clinical Symptoms:  There is chest pain. ECG Impression:  Significant ST abnormalities consistent with ischemia. Comparison with Prior Nuclear Study: Compared to 06/15/09, perfusion images remain normal.   Overall Impression:  Low risk stress nuclear study with chest pain and ST changes in recovery; however, perfusion is normal with no ischemia or infarction.  LV Ejection Fraction: 60%.  LV Wall Motion:  NL LV Function; NL Wall Motion  [name] [name]  Perfusion images are normal.  She did have ST changes in recovery.  ?small vessel disease.  Continue with planned followup unless symptoms have changed.   [name] [name] 10/23/2013

## 2013-10-23 ENCOUNTER — Encounter (HOSPITAL_COMMUNITY): Payer: BC Managed Care – PPO

## 2013-10-26 ENCOUNTER — Telehealth: Payer: Self-pay | Admitting: Cardiology

## 2013-10-26 NOTE — Telephone Encounter (Signed)
New message     Pt want stress test results.

## 2013-10-26 NOTE — Progress Notes (Signed)
lmtcb

## 2013-10-26 NOTE — Telephone Encounter (Signed)
Spoke with patient about recent myoview results. 

## 2013-10-28 ENCOUNTER — Other Ambulatory Visit: Payer: BC Managed Care – PPO

## 2013-10-28 ENCOUNTER — Encounter: Payer: Self-pay | Admitting: Cardiology

## 2013-10-28 ENCOUNTER — Ambulatory Visit (INDEPENDENT_AMBULATORY_CARE_PROVIDER_SITE_OTHER): Payer: BC Managed Care – PPO | Admitting: Cardiology

## 2013-10-28 VITALS — BP 144/60 | HR 84 | Ht 63.0 in | Wt 124.0 lb

## 2013-10-28 DIAGNOSIS — I359 Nonrheumatic aortic valve disorder, unspecified: Secondary | ICD-10-CM

## 2013-10-28 DIAGNOSIS — I5032 Chronic diastolic (congestive) heart failure: Secondary | ICD-10-CM

## 2013-10-28 DIAGNOSIS — I4891 Unspecified atrial fibrillation: Secondary | ICD-10-CM

## 2013-10-28 DIAGNOSIS — N183 Chronic kidney disease, stage 3 unspecified: Secondary | ICD-10-CM

## 2013-10-28 DIAGNOSIS — I509 Heart failure, unspecified: Secondary | ICD-10-CM

## 2013-10-28 DIAGNOSIS — I350 Nonrheumatic aortic (valve) stenosis: Secondary | ICD-10-CM

## 2013-10-28 DIAGNOSIS — I25810 Atherosclerosis of coronary artery bypass graft(s) without angina pectoris: Secondary | ICD-10-CM

## 2013-10-28 MED ORDER — ISOSORBIDE MONONITRATE ER 60 MG PO TB24: ORAL_TABLET | ORAL | Status: DC

## 2013-10-28 NOTE — Patient Instructions (Signed)
Increase Imdur(isosorbide) to 63m daily. This will be 1 and 1/2 of a 667mtablet daily.   Your physician wants you to follow-up in: 3 months with Dr McAundra Dubin(July 2015).  You will receive a reminder letter in the mail two months in advance. If you don't receive a letter, please call our office to schedule the follow-up appointment.

## 2013-10-28 NOTE — Progress Notes (Signed)
I spoke with patient 10/26/13 about these results.

## 2013-10-29 NOTE — Progress Notes (Signed)
Patient ID: [name] [name], female   DOB: 29-Nov-1950, 63 y.o.   MRN: [id number] PCP: Dr. Orson Ape  63 yo with history of CAD s/p CABG in 12/05 and redo in 1/11, bioprosthetic AVR, diastolic CHF, and sick sinus syndrome s/p PCM presents for cardiology followup.  She had initial CABG with SVG-RCA in 12/05 followed by redo SVG-RCA and bioprosthetic AVR in 1/11.  Last echo in 9/14 showed normal EF with well-seated aortic valve, mild to moderate MR, and PA systolic pressure 51 mmHg.  She has had diastolic CHF and is on Lasix.  In 9/14, she had a CPX test.  This showed severe functional limitation with ischemic ECG changes (inferolateral ST depression and ST elevation in V1 and V2). She had chest pain.  I took her for [facility] in 9/14.  This showed elevated left and right heart filling pressures, patent SVG-PDA, and 80% distal LM/80% ostial LAD stenosis.  Patient was deemed not a candidate for redo CABG (had 2 prior sternotomies as well as chest radiation for Hodgkins lymphoma).  She had DES from left main into proximal LAD and will need long-term DAPT => Brilinta was used as she has been a poor responder to Plavix in the past.    She has chronic iron deficiency anemia thought to be due to a slow GI bleed.  Last EGD was unremarkable and a capsule endoscopy was also unremarkable.  She has periodic transfusions; However, recently hemoglobin has been stable.   She has an insulin pump followed by an endocrinologist at [facility].   Patient is on a very low dose of pravastatin.  She has been unable to tolerate multiple statins or higher pravastatin dose due to myalgias.    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  The pain lasted 20 minutes and resolved after she took NTG x 3.  She felt some tachypalpitations with this episode, only rarely since then.  After that, she noted chest pain with exercise, usually when her heart rate rose to the 120s or more.  She also noted more  exertional dyspnea such as when walking up hills or walking fast.  At last appointment, I increased her Imdur to 60 mg daily and had her get an ETT-Cardiolite.  This showed no evidence of ischemia or infarction by perfusion images but she had mild chest pain and ST depression in recovery.      Since I last saw her, she has had no more severe chest pain episodes.  She feels like increasing the Imdur helped.  Initially she had a headache but it resolved.  After walking about 20 minues on the treadmill, she will get a very mild dull chest ache that will resolve with rest.  She only has symptoms with moderate exertion or with emotional stress. She has finished cardiac rehab.  She says that she was told she has cirrhosis from NASH (sees GI at [facility]).  At last appointment, I increased Lasix.  She thinks this has helped her breathing.   Labs (7/14): hemoglobin 11 Labs (9/14): K 3.9, creatinine 1.48, hemoglobin 7.3 Labs (10/14): hemoglobin 7.7 Labs (11/14): K 3.8, creatinine 1.5 Labs (12/14): HCT 37.7 Labs (1/15): LDL 106, HDL 41, K 3.9, creatinine 1.6, LFTs normal Labs (2/15): K 3.7, creatinine 1.1, BNP 71 Labs (3/15): HCT 36.7 Labs (4/15): K 3.7, creatinine 1.1, HCT 35.5  PMH: 1. Sick sinus syndrome s/p [employer] PCM.  2. HTN 3. H/o SBO 4. Renal cell  carcinoma s/p right nephrectomy in 2008.  5. 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  6. Diastolic CHF: Echo (4/13) with EF 60-65%, bioprosthetic aortic valve with mean gradient 15 mmHg, mild MR.  CPX (9/14): peak VO2 10.4, VE/VCO2 43.8, inferolateral ST depression and ST elevation in V1/V2 with severe dyspnea and chest pain, severe functional limitation with ischemic changes.  Echo (9/14) with EF 60-65%, normal RV size and systolic function, mild-moderate MR, bioprosthetic aortic valve normal, PA systolic pressure 51 mmHg, no evidence for pericardial constriction though exam incomplete for constriction.  7. TAH-BSO 8. Type II diabetes: Has insulin pump.  9. H/o  ischemic colitis. 10. H/o PUD. 11. Diabetic gastroparesis. 12. Hyperlipidemia: Myalgias with Crestor and atorvastatin, elevated LFTs with simvastatin.  Myalgias with > 10 mg pravastatin.  13. CKD 14. Bicuspid aortic valve s/p 19 mm Edwards pericardial valve in 1/11 (had post-operative Dresslers syndrome).   15. CAD: CABG with SVG-RCA in 12/05.  Redo SVG-RCA in 1/11 with AVR.  LHC/RHC (9/14) with mean RA 18, PA 62/25 mean 43, mean PCWP 26, CI 2.8, 70-80% distal LM stenosis, 80% ostial LAD stenosis, total occlusion RCA, SVG-PDA patent.  Patient had DES LM into LAD.  Plan to continue long-term ASA/Brilinta (poor responder to Plavix).  ETT-cardiolite (4/15) with 6' exercise, EF 60%, ST depression in recovery and mild chest pain, no ischemia or infarction by perfusion images.  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  17. Anemia: Iron-deficiency.  Suspect chronic GI blood loss.  EGD in 9/14 was unremarkable.  Capsule endoscopy 10/14 was unremarkable.  Has required periodic blood transfusions.  18. Atrial fibrillation: Paroxysmal, noted by [facility] interrogation.  19. Carotid dopplers (2/15) with 40-59% bilateral stenosis.   SH: Married, lives in [place], nonsmoker.   FH: CAD  ROS: All systems reviewed and negative except as per HPI.   Current Outpatient Prescriptions  Medication Sig Dispense Refill  . ALPRAZolam (XANAX) 0.5 MG tablet Take 0.25 mg by mouth 2 (two) times daily as needed.       Marland Kitchen aminocaproic acid (AMICAR) 25 % solution Take 10 ml and hold in mouth x 2 minutes then spit every 6 hours x 2 days prn gums bleeding post dental cleaning.  400 mL  0  . aspirin EC 81 MG tablet Take 81 mg by mouth at bedtime.      . Calcium Carbonate-Vitamin D (RA CALCIUM PLUS VITAMIN D) 600-400 MG-UNIT per tablet Take 1 tablet by mouth 2 (two) times daily.       . cholecalciferol (VITAMIN D) 1000 UNITS tablet Take 1,000 Units by mouth 2 (two) times daily.       Marland Kitchen  desoximetasone (TOPICORT) 0.05 % cream Apply 1 application topically 2 (two) times daily as needed. When skin starts peeling      . docusate sodium (COLACE) 100 MG capsule Take 1 capsule (100 mg total) by mouth 2 (two) times daily.  60 capsule  3  . fenofibrate (TRICOR) 145 MG tablet TAKE ONE (1) TABLET EACH DAY  30 tablet  5  . ferrous fumarate (HEMOCYTE - 106 MG FE) 325 (106 FE) MG TABS tablet Take 1 tablet by mouth 2 (two) times a week. Take with Vitamin C tablet      . fluticasone (FLONASE) 50 MCG/ACT nasal spray Place 2 sprays into the nose daily as needed for rhinitis. For nasal congestion      . furosemide (LASIX) 40 MG tablet 2 tablets (81m) daily  60 tablet  3  . glucagon (GLUCAGON EMERGENCY) 1 MG injection Inject 1 mg into the vein once as needed (for severe reaction).      . Insulin Human (INSULIN PUMP) 100 unit/ml SOLN Inject into the skin continuous. Apidra Basal Rate midnight-400 : 0.3 400-700: 0.25 700-830: 2.5 830- 1230: 3.0 1230-1900: 2.5 1900-2400: 3.2      . Magnesium 250 MG TABS Take 250 mg by mouth daily.       . metoprolol (LOPRESSOR) 50 MG tablet 1 TAB TWICE DAILY      . Multiple Vitamins-Minerals (MULTIVITAMIN WITH MINERALS) tablet Take 1 tablet by mouth daily.        . nitroGLYCERIN (NITROLINGUAL) 0.4 MG/SPRAY spray Place 1 spray under the tongue every 5 (five) minutes as needed for chest pain.      Marland Kitchen omeprazole (PRILOSEC OTC) 20 MG tablet Take 20 mg by mouth daily.      . polyethylene glycol (MIRALAX / GLYCOLAX) packet Take 8.5 g by mouth daily as needed. For constipation      . potassium chloride SA (K-DUR,KLOR-CON) 20 MEQ tablet TAKE TWO TABLETS BY MOUTH DAILY  60 tablet  3  . pravastatin (PRAVACHOL) 10 MG tablet TAKE ONE (1) TABLET BY MOUTH EVERY DAY  90 tablet  1  . Ticagrelor (BRILINTA) 90 MG TABS tablet Take 1 tablet (90 mg total) by mouth 2 (two) times daily.  60 tablet  6  . vitamin C (ASCORBIC ACID) 500 MG tablet Take 250 mg by mouth daily.       Marland Kitchen ZETIA 10  MG tablet TAKE ONE TABLET BY MOUTH EVERY NIGHT AT BEDTIME  90 tablet  3  . isosorbide mononitrate (IMDUR) 60 MG 24 hr tablet 1 and 1/2 tablets (total 18m) daily  45 tablet  6   No current facility-administered medications for this visit.    BP 144/60  Pulse 84  Ht 5' 3"  (1.6 m)  Wt 124 lb (56.246 kg)  BMI 21.97 kg/m2 General: NAD Neck: JVP 7 cm, no thyromegaly or thyroid nodule.  Lungs: Clear to auscultation bilaterally with normal respiratory effort. CV: Nondisplaced PMI.  Heart regular S1/S2, no SN3/Z7 2/6 systolic murmur RUSB.  No peripheral edema.  Left carotid bruit.  2+ PT pulses bilaterally.  Abdomen: Soft, nontender, no hepatosplenomegaly, no distention.  Skin: Intact without lesions or rashes.  Neurologic: Alert and oriented x 3.  Psych: Normal affect. Extremities: No clubbing or cyanosis.   Assessment/Plan: 1. CAD: s/p SVG-RCA in 12/05, then redo SVG-RCA in 1/11.  She was found to have distal left main/proximal LAD severe stenosis in 9/14.  She was very symptomatic.  PCI with DES to distal LM/proximal LAD was done rather than bypass as she would have been a poor candidate for redo sternotomy (2 prior sternotomies and history of chest wall radiation for Hodgkins disease).  She will need to be on Brilinta and ASA (poor responder to plavix) long-term.  This is complicated by suspected slow GI bleed. So far, this has been controlled with periodic transfusions (hemoglobin has been stable recently). Recently, she started having more exertional chest pain but nowhere near what she was having prior to last PCI.  She was not significantly anemic on recent CBC.  ETT-Cardiolite in 4/15 showed no ischemia or infarction on perfusion images but there were ECG changes in recovery and she had mild chest pain.  Imdur increase has helped.  She may have microvascular angina as the cause of her symptoms.   - Continue ASA 81,  statin, metoprolol, Brilinta.   - Increase Imdur to 90 mg daily. - I  considered ranolazine but unfortunately this is contraindicated with cirrhosis.  2. Bioprosthetic aortic valve replacement: Stable on last echo.  3. Diastolic CHF: Elevated left and right heart filling pressures on RHC prior to recent coronary intervention.  Dyspnea improved.  - Continue current Lasix.  4. CKD:  Patient has a single kidney s/p right nephrectomy.  She has been referred to nephrology. Creatinine has been stable.  5. Hyperlipidemia: She has not been able to tolerate any higher potency statin than pravastatin 10 mg daily.  She is on Zetia. LDL was 106 in 1/15.  6. Anemia: Suspected slow GI bleed.  Recent unremarkable EGD and capsule endoscopy in fall 2014.  Unfortunately, needs to stay on Brilinta.  Will need to follow closely (sees hematology), plan for transfusion with hemoglobin < 8 as this allows best control of her symptoms. Recent CBC looked good.   7. Atrial fibrillation: Paroxysmal, brief episodes noted on PCM interrogation.  Given GI bleeding and need to be on both ASA 81 and Brilinta, risks likely outweigh benefits of anticoagulation.  8. Carotid bruit: Mild to moderate bilateral stenosis.  Repeat in 1 year (3/16)    Larey Dresser 10/29/2013

## 2013-10-30 ENCOUNTER — Telehealth: Payer: Self-pay | Admitting: Cardiology

## 2013-10-30 NOTE — Telephone Encounter (Signed)
New message     Pt has had a nosebleed most of the day today. She is on birlinta.  What can she do to stop it?

## 2013-10-30 NOTE — Telephone Encounter (Signed)
Pt states nose bleed not continuous but drippy off and on today. Reviewed with Dr Ervin Knack aspirin tonight, use saline nasal spray instead of Flonase right now. Pt advised, verbalized understanding.

## 2013-11-02 ENCOUNTER — Encounter: Payer: Self-pay | Admitting: Internal Medicine

## 2013-11-02 ENCOUNTER — Ambulatory Visit (INDEPENDENT_AMBULATORY_CARE_PROVIDER_SITE_OTHER): Payer: BC Managed Care – PPO | Admitting: *Deleted

## 2013-11-02 DIAGNOSIS — I4892 Unspecified atrial flutter: Secondary | ICD-10-CM

## 2013-11-02 DIAGNOSIS — I4891 Unspecified atrial fibrillation: Secondary | ICD-10-CM

## 2013-11-02 DIAGNOSIS — I495 Sick sinus syndrome: Secondary | ICD-10-CM

## 2013-11-03 LAB — MDC_IDC_ENUM_SESS_TYPE_REMOTE
Battery Impedance: 113 Ohm
Battery Remaining Longevity: 171 mo
Battery Voltage: 2.81 V
Brady Statistic AP VS Percent: 0 %
Brady Statistic AS VS Percent: 100 %
Date Time Interrogation Session: 20150413124454
Lead Channel Impedance Value: 487 Ohm
Lead Channel Impedance Value: 646 Ohm
Lead Channel Pacing Threshold Pulse Width: 0.4 ms
Lead Channel Sensing Intrinsic Amplitude: 2.8 mV
Lead Channel Setting Pacing Amplitude: 1.5 V
Lead Channel Setting Pacing Amplitude: 2 V
Lead Channel Setting Pacing Pulse Width: 0.4 ms
Lead Channel Setting Sensing Sensitivity: 5.6 mV
MDC IDC MSMT LEADCHNL RA PACING THRESHOLD AMPLITUDE: 0.75 V
MDC IDC MSMT LEADCHNL RA PACING THRESHOLD PULSEWIDTH: 0.4 ms
MDC IDC MSMT LEADCHNL RV PACING THRESHOLD AMPLITUDE: 0.625 V
MDC IDC MSMT LEADCHNL RV SENSING INTR AMPL: 16 mV
MDC IDC STAT BRADY AP VP PERCENT: 0 %
MDC IDC STAT BRADY AS VP PERCENT: 0 %

## 2013-11-05 ENCOUNTER — Telehealth: Payer: Self-pay | Admitting: Internal Medicine

## 2013-11-05 NOTE — Telephone Encounter (Signed)
LM informing patient that remote transmission was received. Encouraged patient to call back with further questions.

## 2013-11-05 NOTE — Telephone Encounter (Signed)
New message     Did we get her remote pacemaker check?  This was her first time doing it and she want to make sure she did it correct.

## 2013-11-08 ENCOUNTER — Encounter: Payer: Self-pay | Admitting: Internal Medicine

## 2013-11-10 ENCOUNTER — Other Ambulatory Visit: Payer: Self-pay | Admitting: Cardiology

## 2013-11-17 ENCOUNTER — Encounter: Payer: Self-pay | Admitting: *Deleted

## 2013-11-30 ENCOUNTER — Other Ambulatory Visit (HOSPITAL_COMMUNITY): Payer: Self-pay | Admitting: Nurse Practitioner

## 2013-12-08 ENCOUNTER — Other Ambulatory Visit: Payer: Self-pay | Admitting: Internal Medicine

## 2013-12-17 ENCOUNTER — Telehealth: Payer: Self-pay | Admitting: Cardiology

## 2013-12-17 NOTE — Telephone Encounter (Signed)
Usually for patients with PCN allergy I would have them take azithromycin.  Is she allergic to that?

## 2013-12-17 NOTE — Telephone Encounter (Signed)
They are the same.  She will need to take azithromycin.  Cipro is not a viable alternative.

## 2013-12-17 NOTE — Telephone Encounter (Signed)
Are cleocin and clindamycin similar?  She is allergic to cleocin.

## 2013-12-17 NOTE — Telephone Encounter (Signed)
Other alternative is clindamycin 600 mg prior to dental work.  Her options would be that or azithromycin 500 mg.  Would be ok to use clindamycin.

## 2013-12-17 NOTE — Telephone Encounter (Signed)
Pt states she has taken a Zpak before without problems. Her concern with Zpak is that she read on their web site that it may cause/ have some effect on atrial fibrillation. She has had an allergic reaction to both cleocin and penicillin.

## 2013-12-17 NOTE — Telephone Encounter (Signed)
New message     Pt is having a dental cleaning on Tuesday-----cannot take a lot of antibiotics.  Pt told dentist cipro is the only antibiotic she can take.  Is this ok to take and what is the dosage?

## 2013-12-21 MED ORDER — AZITHROMYCIN 500 MG PO TABS: ORAL_TABLET | ORAL | Status: DC

## 2013-12-21 NOTE — Telephone Encounter (Signed)
Azithromycin 500 mg prior to procedure

## 2013-12-21 NOTE — Telephone Encounter (Signed)
What are your dosing recommendations for azithromycin for endocarditis prophylaxis for her?

## 2013-12-21 NOTE — Telephone Encounter (Signed)
Pt advised.

## 2013-12-30 ENCOUNTER — Telehealth: Payer: Self-pay | Admitting: *Deleted

## 2013-12-30 NOTE — Telephone Encounter (Signed)
Message copied by [name] on Wed Dec 30, 2013 10:52 AM ------      Message from: [name]      Created: Wed Dec 23, 2013  2:57 PM      Regarding: [name] [name] KIDNEY       12/23/13 Appointment on 01/13/14 at 3:15p ------

## 2014-01-14 ENCOUNTER — Telehealth: Payer: Self-pay | Admitting: *Deleted

## 2014-01-14 NOTE — Telephone Encounter (Signed)
Notified pt of future appointments scheduled on 02/08/2014  For Dr. Marko Plume and labs. Pt agreed with appt time and date

## 2014-01-27 ENCOUNTER — Ambulatory Visit: Payer: BC Managed Care – PPO

## 2014-02-02 ENCOUNTER — Telehealth: Payer: Self-pay | Admitting: Cardiology

## 2014-02-02 NOTE — Telephone Encounter (Signed)
Pt advised. Offered pt appt tomorrow but she declined, she will keep appt already scheduled 02/22/14. She will monitor symptoms and call if no improvement.

## 2014-02-02 NOTE — Telephone Encounter (Signed)
Since Saturday PM pt has had a spot on her left bicep, about the size of a pin head, that has continuously been oozing blood. It seems to have slowed down but still has some blood on cotton ball (not completely saturated) when she changes it in the morning. Pt is still taking Brilanta and ASA 11m daily. No other signs of bleeding.  On Sunday a week ago pt had an episode of SOB and chest pressure that was relieved with NTG spray x 3. This was the first time she had symptoms like this in about 3 months.   For the last 2 nights, right before getting in bed she had similar symptoms that were relieved with rest and 1 NTG spray. Pt states her weight has been up and down, currently up about 3 pounds. She denies lower extremity edema but does have some abdominal distention that she states may be GI related to gastroparesis.  I will forward to Dr MAundra Dubinfor review.

## 2014-02-02 NOTE — Telephone Encounter (Signed)
New message     Pt has a tiny spot on arm that has been bleeding off-n-on for several days.  She can stop the bleeding with a pressure bandage but it returns to bleeding again---oozing blood.  Pt is on a blood thinner.  Please advise. Also, within the last 2 weeks patient has had to use her nitrolingual because of sob and chest tightness.  She is not having it now.  It may be due to the heat. Please advise

## 2014-02-02 NOTE — Telephone Encounter (Signed)
Would try to wait out the bleeding without changing her meds.  If no more chest pain, can wait until next scheduled appt.  If she has more chest pain, get her in to see me.

## 2014-02-03 ENCOUNTER — Encounter: Payer: Self-pay | Admitting: Internal Medicine

## 2014-02-03 ENCOUNTER — Ambulatory Visit (INDEPENDENT_AMBULATORY_CARE_PROVIDER_SITE_OTHER): Payer: BC Managed Care – PPO | Admitting: *Deleted

## 2014-02-03 DIAGNOSIS — I4892 Unspecified atrial flutter: Secondary | ICD-10-CM

## 2014-02-03 DIAGNOSIS — I484 Atypical atrial flutter: Secondary | ICD-10-CM

## 2014-02-03 DIAGNOSIS — I480 Paroxysmal atrial fibrillation: Secondary | ICD-10-CM

## 2014-02-03 DIAGNOSIS — I495 Sick sinus syndrome: Secondary | ICD-10-CM

## 2014-02-03 DIAGNOSIS — I4891 Unspecified atrial fibrillation: Secondary | ICD-10-CM

## 2014-02-03 NOTE — Progress Notes (Signed)
Remote pacemaker transmission.   

## 2014-02-04 ENCOUNTER — Other Ambulatory Visit: Payer: BC Managed Care – PPO

## 2014-02-04 NOTE — Telephone Encounter (Signed)
Close encounter 

## 2014-02-05 ENCOUNTER — Other Ambulatory Visit: Payer: Self-pay

## 2014-02-05 DIAGNOSIS — C649 Malignant neoplasm of unspecified kidney, except renal pelvis: Secondary | ICD-10-CM

## 2014-02-05 LAB — MDC_IDC_ENUM_SESS_TYPE_REMOTE
Brady Statistic AP VP Percent: 0 %
Brady Statistic AP VS Percent: 0 %
Brady Statistic AS VS Percent: 100 %
Date Time Interrogation Session: 20150715132947
Lead Channel Impedance Value: 662 Ohm
Lead Channel Pacing Threshold Amplitude: 0.625 V
Lead Channel Pacing Threshold Pulse Width: 0.4 ms
Lead Channel Pacing Threshold Pulse Width: 0.4 ms
Lead Channel Sensing Intrinsic Amplitude: 16 mV
Lead Channel Sensing Intrinsic Amplitude: 2.8 mV
Lead Channel Setting Pacing Amplitude: 1.5 V
Lead Channel Setting Sensing Sensitivity: 5.6 mV
MDC IDC MSMT BATTERY IMPEDANCE: 137 Ohm
MDC IDC MSMT BATTERY REMAINING LONGEVITY: 164 mo
MDC IDC MSMT BATTERY VOLTAGE: 2.81 V
MDC IDC MSMT LEADCHNL RA IMPEDANCE VALUE: 487 Ohm
MDC IDC MSMT LEADCHNL RA PACING THRESHOLD AMPLITUDE: 0.75 V
MDC IDC SET LEADCHNL RV PACING AMPLITUDE: 2 V
MDC IDC SET LEADCHNL RV PACING PULSEWIDTH: 0.4 ms
MDC IDC STAT BRADY AS VP PERCENT: 0 %

## 2014-02-07 ENCOUNTER — Other Ambulatory Visit: Payer: Self-pay | Admitting: Oncology

## 2014-02-08 ENCOUNTER — Ambulatory Visit (HOSPITAL_BASED_OUTPATIENT_CLINIC_OR_DEPARTMENT_OTHER): Payer: BC Managed Care – PPO | Admitting: Oncology

## 2014-02-08 ENCOUNTER — Telehealth: Payer: Self-pay | Admitting: Oncology

## 2014-02-08 ENCOUNTER — Encounter: Payer: Self-pay | Admitting: Oncology

## 2014-02-08 ENCOUNTER — Ambulatory Visit (HOSPITAL_BASED_OUTPATIENT_CLINIC_OR_DEPARTMENT_OTHER): Payer: BC Managed Care – PPO

## 2014-02-08 VITALS — BP 106/54 | HR 70 | Temp 97.8°F | Resp 18 | Ht 63.0 in | Wt 121.9 lb

## 2014-02-08 DIAGNOSIS — K922 Gastrointestinal hemorrhage, unspecified: Secondary | ICD-10-CM

## 2014-02-08 DIAGNOSIS — D500 Iron deficiency anemia secondary to blood loss (chronic): Secondary | ICD-10-CM

## 2014-02-08 DIAGNOSIS — Z87898 Personal history of other specified conditions: Secondary | ICD-10-CM

## 2014-02-08 DIAGNOSIS — Z8673 Personal history of transient ischemic attack (TIA), and cerebral infarction without residual deficits: Secondary | ICD-10-CM

## 2014-02-08 DIAGNOSIS — Z85528 Personal history of other malignant neoplasm of kidney: Secondary | ICD-10-CM

## 2014-02-08 DIAGNOSIS — E119 Type 2 diabetes mellitus without complications: Secondary | ICD-10-CM

## 2014-02-08 DIAGNOSIS — C649 Malignant neoplasm of unspecified kidney, except renal pelvis: Secondary | ICD-10-CM

## 2014-02-08 DIAGNOSIS — Z8571 Personal history of Hodgkin lymphoma: Secondary | ICD-10-CM

## 2014-02-08 LAB — CBC WITH DIFFERENTIAL/PLATELET
BASO%: 1.2 % (ref 0.0–2.0)
Basophils Absolute: 0.1 10*3/uL (ref 0.0–0.1)
EOS ABS: 0.2 10*3/uL (ref 0.0–0.5)
EOS%: 2 % (ref 0.0–7.0)
HEMATOCRIT: 36.4 % (ref 34.8–46.6)
HGB: 11.8 g/dL (ref 11.6–15.9)
LYMPH%: 13.8 % — AB (ref 14.0–49.7)
MCH: 29.3 pg (ref 25.1–34.0)
MCHC: 32.6 g/dL (ref 31.5–36.0)
MCV: 90 fL (ref 79.5–101.0)
MONO#: 0.4 10*3/uL (ref 0.1–0.9)
MONO%: 5 % (ref 0.0–14.0)
NEUT#: 6.5 10*3/uL (ref 1.5–6.5)
NEUT%: 78 % — AB (ref 38.4–76.8)
PLATELETS: 156 10*3/uL (ref 145–400)
RBC: 4.04 10*6/uL (ref 3.70–5.45)
RDW: 14.5 % (ref 11.2–14.5)
WBC: 8.4 10*3/uL (ref 3.9–10.3)
lymph#: 1.2 10*3/uL (ref 0.9–3.3)

## 2014-02-08 LAB — IRON AND TIBC CHCC
%SAT: 20 % — ABNORMAL LOW (ref 21–57)
Iron: 72 ug/dL (ref 41–142)
TIBC: 365 ug/dL (ref 236–444)
UIBC: 293 ug/dL (ref 120–384)

## 2014-02-08 NOTE — Progress Notes (Signed)
OFFICE PROGRESS NOTE   02/08/2014   Physicians:W.McGough, [name] (endocrine [facility]), [name], D.McLean, G.Lovena Le, B.Bartle, A.Deal (GI [facility]   INTERVAL HISTORY:  Patient is seen, alone for visit, in scheduled follow up of anemia thought secondary to intermittent GI bleeding, in setting of multiple significant and symptomatic comorbidities. Past oncologic history is significant for Hodgkins treated with mantle radiation in 1980s, and post right nephrectomy for renal cell carcinoma 2008. She continues long term Brilinta and ASA for coronary artery stents including left main to proximal LAD. She has not seen apparent GI blood loss since she was here last, but did have continuous oozing from tiny scratch on upper arm for 3 days recently, despite pressure dressings. Amicar mouthwash controlled gum bleeding well after recent dental cleaning. Last PRBCs were 04-2013 and last feraheme was 05-2013. She tolerates oral iron poorly due to baseline GI motility problems.  Patient has had increased DOE especially at night for past week or so "I cannot rub my granddaughter's back and read bedtime story at same time", also cannot demonstrate leg motions with Bible school music, tho able to do arm motions. She is on primarily liquid diet as she continues evaluation for severe gastroparesis by Dr Lacinda Axon at [facility]; she is followed for type I DM at [facility].     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 She has radiation induced hypothyroidism and the treatment caused or worsened coronary artery disease. She has had no further active Hodgkins.  Oncologic history is also remarkable for right nephrectomy for renal cell carcinoma 2008 (T1bNx chromophobe variant Fuhrman Nuclear Grade III-IV with negative margins, no renal vein invasion, no extension thru capsule at right nephrectomy  06-11-2007 by [name], path [id number]). She is no longer followed regularly by urology.  Significant comorbid conditions include ACD with prior CABG then redo CABG, aortic stenosis post aortic valve 2011, SSS with pacer, insulin dependent diabetes now followed by Dr [name] at [facility] and on insulin pump, hypothyroidism related to RT for Hodgkins, diagnosis of ischemic colitis with rectal bleeding during past year, and some renal insufficiency with single kidney.Patient was [facility] thru 04-11-13 with progressive dyspnea, orthopnea, unstable angina and Hgb of 7.3 on day of admission, this following outpatient evaluation with cardiopulmonary exercise test with evidence of ischemia. She ruled out for MI. Stool was heme + by GI in hospital; she was transfused 1 unit of PRBCs on 04-03-13, given over 4 hours and note from those MDs that she should have lasix with further transfusions. She began oral iron, did not tolerate this bid.. EGD 04-06-13 was unremarkable including duodenal biopsy. She had cardiac cath on 04-07-13 with high grade LM and proximal LAD involvement, and diffuse calcific plaque in aorta. CVTS (Dr Cyndia Bent) felt that she was not a candidate for a 3rd re-do surgical procedure particularly due to diffuse aortic calcification. She had PTCA of left main with stent into ostium of LM into LAD by Dr Angelena Form on 04-09-13; per cardiology she will need lifelong dual antiplatelet therapy.  Review of systems as above, also: No fever or symptoms of infection. GI symptoms much better on the liquid diet, which includes smoothies and greek yogurt, not using supplements. No LE swelling. Remainder of 10 point Review of Systems negative.  Objective:  Vital signs in last 24 hours:  BP 106/54  Pulse 70  Temp(Src) 97.8 F (  36.6 C) (Oral)  Resp 18  Ht 5' 3"  (1.6 m)  Wt 121 lb 14.4 oz (55.293 kg)  BMI 21.60 kg/m2 O2 sats 97 - 99 walking in hall now. Weight is down 2 lbs. Alert, oriented  and appropriate, extremely good historian as always.  Ambulatory without assistance. Speech fluent.   HEENT:PERRL, sclerae not icteric. Oral mucosa moist without lesions, posterior pharynx clear.  Neck supple. No JVD.  Lymphatics:no cervical,suraclavicular adenopathy Resp: clear to auscultation bilaterally and normal percussion bilaterally Cardio: regular rate and rhythm. No gallop. Pacer in. GI: soft, nontender, not distended, no mass or organomegaly. Some bowel sounds. Insulin pump. Musculoskeletal/ Extremities: without pitting edema, cords, tenderness Neuro: no focal changes Skin without rash, ecchymosis, petechiae   Lab Results:  Results for orders placed in visit on 02/08/14  CBC WITH DIFFERENTIAL      Result Value Ref Range   WBC 8.4  3.9 - 10.3 10e3/uL   NEUT# 6.5  1.5 - 6.5 10e3/uL   HGB 11.8  11.6 - 15.9 g/dL   HCT 36.4  34.8 - 46.6 %   Platelets 156  145 - 400 10e3/uL   MCV 90.0  79.5 - 101.0 fL   MCH 29.3  25.1 - 34.0 pg   MCHC 32.6  31.5 - 36.0 g/dL   RBC 4.04  3.70 - 5.45 10e6/uL   RDW 14.5  11.2 - 14.5 %   lymph# 1.2  0.9 - 3.3 10e3/uL   MONO# 0.4  0.1 - 0.9 10e3/uL   Eosinophils Absolute 0.2  0.0 - 0.5 10e3/uL   Basophils Absolute 0.1  0.0 - 0.1 10e3/uL   NEUT% 78.0 (*) 38.4 - 76.8 %   LYMPH% 13.8 (*) 14.0 - 49.7 %   MONO% 5.0  0.0 - 14.0 %   EOS% 2.0  0.0 - 7.0 %   BASO% 1.2  0.0 - 2.0 %     Studies/Results:  No results found.  Last mammograms this EMR were at [facility] 01-2013  Medications: I have reviewed the patient's current medications.  DISCUSSION: she is able to check O2 saturation at home, which she will do if symptomatic SOB.   Assessment/Plan: 1.intermittent anemia related to intermittent GI bleeding in patient on necessary long term ticagrelor and asa, post IV iron 05-2013 and continuing po iron. I do not think hemoglobin 11.8 now is adding to symptoms. I will recheck hgb in ~ 48moif not done by other MDs around then, or sooner if needed, and  will recheck iron studies also then. Note needs lasix if PRBCs. 2.IIA nodular sclerosing Hodgkins treated with mantle radiation in 1980s.  3.post right nephrectomy for renal cell carcinoma 2008, not known recurrent. Last UA without blood.  4.Cardiac disease: CAD post RCA vein graft and redo CABG 2011, aortic stenosis post aortic valve 2011, sinus node dysfunction with brief episodes PAF, dual chamber pacer Jan 2014. Now post stent to LM into proximal LAD for progressive involvement there, not candidate for another surgery per CVTS. Increased DOE recently, good O2 sats walking now. 5. Insulin dependent diabetes: [facility].  6.hypothyroidism post RT, on replacement, Dr SFrederik Pearalso following [place] 7.rectal bleeding ~ 12-2012 thought from ischemic colitis. Post colonoscopy 06-2012 with no polyps.  8.renal insufficiency: single kidney, creatinines 1.1 in 10-2013 9.gastroparesis related to DM: symptoms much better on liquid diet, [facility] 10. Amicar mouthwash very helpful for gum bleeding after dental procedure; she could try  this topically if skin scratches bleed excessively.  Time spent 25 min including >50% counseling and coordination of care.   LIVESAY,LENNIS P, MD   02/08/2014, 10:23 AM

## 2014-02-08 NOTE — Telephone Encounter (Signed)
per pof to sch pt appt-gave pt copy of sch °

## 2014-02-10 ENCOUNTER — Telehealth: Payer: Self-pay | Admitting: Oncology

## 2014-02-10 ENCOUNTER — Other Ambulatory Visit: Payer: Self-pay | Admitting: Cardiology

## 2014-02-10 NOTE — Telephone Encounter (Signed)
lvm for pt regarding to OCT appt ...mailed pt appt sched/avs and letter

## 2014-02-22 ENCOUNTER — Encounter: Payer: Self-pay | Admitting: Cardiology

## 2014-02-22 ENCOUNTER — Ambulatory Visit (INDEPENDENT_AMBULATORY_CARE_PROVIDER_SITE_OTHER): Payer: BC Managed Care – PPO | Admitting: Cardiology

## 2014-02-22 VITALS — BP 124/64 | HR 78 | Ht 63.0 in | Wt 122.0 lb

## 2014-02-22 DIAGNOSIS — I4891 Unspecified atrial fibrillation: Secondary | ICD-10-CM

## 2014-02-22 DIAGNOSIS — I509 Heart failure, unspecified: Secondary | ICD-10-CM

## 2014-02-22 DIAGNOSIS — I5030 Unspecified diastolic (congestive) heart failure: Secondary | ICD-10-CM

## 2014-02-22 DIAGNOSIS — I359 Nonrheumatic aortic valve disorder, unspecified: Secondary | ICD-10-CM

## 2014-02-22 DIAGNOSIS — N183 Chronic kidney disease, stage 3 unspecified: Secondary | ICD-10-CM

## 2014-02-22 DIAGNOSIS — E785 Hyperlipidemia, unspecified: Secondary | ICD-10-CM

## 2014-02-22 DIAGNOSIS — I5032 Chronic diastolic (congestive) heart failure: Secondary | ICD-10-CM

## 2014-02-22 DIAGNOSIS — I480 Paroxysmal atrial fibrillation: Secondary | ICD-10-CM

## 2014-02-22 DIAGNOSIS — I25812 Atherosclerosis of bypass graft of coronary artery of transplanted heart without angina pectoris: Secondary | ICD-10-CM

## 2014-02-22 LAB — BASIC METABOLIC PANEL
BUN: 32 mg/dL — ABNORMAL HIGH (ref 6–23)
CHLORIDE: 100 meq/L (ref 96–112)
CO2: 28 meq/L (ref 19–32)
Calcium: 9.9 mg/dL (ref 8.4–10.5)
Creatinine, Ser: 1.1 mg/dL (ref 0.4–1.2)
GFR: 54.26 mL/min — ABNORMAL LOW (ref >60.00)
Glucose, Bld: 220 mg/dL — ABNORMAL HIGH (ref 70–99)
POTASSIUM: 3.7 meq/L (ref 3.5–5.1)
SODIUM: 136 meq/L (ref 135–145)

## 2014-02-22 LAB — BRAIN NATRIURETIC PEPTIDE: PRO B NATRI PEPTIDE: 85 pg/mL (ref 0.0–100.0)

## 2014-02-22 NOTE — Patient Instructions (Addendum)
Your physician recommends that you schedule a follow-up appointment in: 4 months with [name].   Your physician recommends that you have lab work today--BMET/BNP.  You have been referred to [facility] for evaluation for new cholesterol medication--PCSK9

## 2014-02-22 NOTE — Progress Notes (Signed)
Patient ID: [name] [name], female   DOB: [birth year], 63 y.o.   MRN: [id number] PCP: Dr. Orson Ape  63 yo with history of CAD s/p CABG in 12/05 and redo in 1/11, bioprosthetic AVR, diastolic CHF, and sick sinus syndrome s/p PCM presents for cardiology followup.  She had initial CABG with SVG-RCA in 12/05 followed by redo SVG-RCA and bioprosthetic AVR in 1/11.  Last echo in 9/14 showed normal EF with well-seated aortic valve, mild to moderate MR, and PA systolic pressure 51 mmHg.  She has had diastolic CHF and is on Lasix.  In 9/14, she had a CPX test.  This showed severe functional limitation with ischemic ECG changes (inferolateral ST depression and ST elevation in V1 and V2). She had chest pain.  I took her for [facility] in 9/14.  This showed elevated left and right heart filling pressures, patent SVG-PDA, and 80% distal LM/80% ostial LAD stenosis.  Patient was deemed not a candidate for redo CABG (had 2 prior sternotomies as well as chest radiation for Hodgkins lymphoma).  She had DES from left main into proximal LAD and will need long-term DAPT => Brilinta was used as she has been a poor responder to Plavix in the past.    She has chronic iron deficiency anemia thought to be due to a slow GI bleed.  Last EGD was unremarkable and a capsule endoscopy was also unremarkable.  She has periodic transfusions; However, recently hemoglobin has been stable.   She has an insulin pump followed by an endocrinologist at [facility].   Patient is on a very low dose of pravastatin.  She has been unable to tolerate multiple statins or higher pravastatin dose due to myalgias.  She still has myalgias even with a low dose of pravastatin.   She feels good on most days.  She takes NTG about once every 2 weeks.  She will get a random, nonexertional chest pain (usually while lying in bed watching TV).  She does not have exertional chest pain currently.  She is short of breath walking up steps and up hills.  She sleeps with the head of  her bed propped up chronically.  Weight is down 2 lbs.    Labs (7/14): hemoglobin 11 Labs (9/14): K 3.9, creatinine 1.48, hemoglobin 7.3 Labs (10/14): hemoglobin 7.7 Labs (11/14): K 3.8, creatinine 1.5 Labs (12/14): HCT 37.7 Labs (1/15): LDL 106, HDL 41, K 3.9, creatinine 1.6, LFTs normal Labs (2/15): K 3.7, creatinine 1.1, BNP 71 Labs (3/15): HCT 36.7 Labs (4/15): K 3.7, creatinine 1.1, HCT 35.5 Labs (7/15): HCT 36.4  ECG: NSR, 1st degree AV block 342 msec, QTc 469  PMH: 1. Sick sinus syndrome s/p [employer] PCM.  2. HTN 3. H/o SBO 4. Renal cell carcinoma s/p right nephrectomy in 2008.  5. 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  6. Diastolic CHF: Echo (4/85) with EF 60-65%, bioprosthetic aortic valve with mean gradient 15 mmHg, mild MR.  CPX (9/14): peak VO2 10.4, VE/VCO2 43.8, inferolateral ST depression and ST elevation in V1/V2 with severe dyspnea and chest pain, severe functional limitation with ischemic changes.  Echo (9/14) with EF 60-65%, normal RV size and systolic function, mild-moderate MR, bioprosthetic aortic valve normal, PA systolic pressure 51 mmHg, no evidence for pericardial constriction though exam incomplete for constriction.  7. TAH-BSO 8. Type II diabetes: Has insulin pump.  9. H/o ischemic colitis. 10. H/o PUD. 11. Diabetic gastroparesis. 12. Hyperlipidemia: Myalgias with Crestor and atorvastatin, elevated LFTs with simvastatin.  Myalgias with > 10  mg pravastatin.  13. CKD 14. Bicuspid aortic valve s/p 19 mm Edwards pericardial valve in 1/11 (had post-operative Dresslers syndrome).   15. CAD: CABG with SVG-RCA in 12/05.  Redo SVG-RCA in 1/11 with AVR.  LHC/RHC (9/14) with mean RA 18, PA 62/25 mean 43, mean PCWP 26, CI 2.8, 70-80% distal LM stenosis, 80% ostial LAD stenosis, total occlusion RCA, SVG-PDA patent.  Patient had DES LM into LAD.  Plan to continue long-term ASA/Brilinta (poor responder to Plavix).  ETT-cardiolite (4/15) with 6' exercise, EF 60%, ST depression in  recovery and mild chest pain, no ischemia or infarction by perfusion images.  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  17. Anemia: Iron-deficiency.  Suspect chronic GI blood loss.  EGD in 9/14 was unremarkable.  Capsule endoscopy 10/14 was unremarkable.  Has required periodic blood transfusions.  18. Atrial fibrillation: Paroxysmal, noted by [facility] interrogation.  19. Carotid dopplers (2/15) with 40-59% bilateral stenosis.   SH: Married, lives in [place], nonsmoker.   FH: CAD  ROS: All systems reviewed and negative except as per HPI.   Current Outpatient Prescriptions  Medication Sig Dispense Refill  . ALPRAZolam (XANAX) 0.5 MG tablet Take 0.25 mg by mouth 2 (two) times daily as needed.       Marland Kitchen aminocaproic acid (AMICAR) 25 % solution Take 10 ml and hold in mouth x 2 minutes then spit every 6 hours x 2 days prn gums bleeding post dental cleaning.  400 mL  0  . aspirin EC 81 MG tablet Take 81 mg by mouth at bedtime.      Marland Kitchen azithromycin (ZITHROMAX) 500 MG tablet 1 tablet by mouth one hour prior to dental appointment/procedure  2 tablet  0  . BRILINTA 90 MG TABS tablet TAKE ONE TABLET TWICE DAILY  60 tablet  6  . Calcium Carbonate-Vitamin D (RA CALCIUM PLUS VITAMIN D) 600-400 MG-UNIT per tablet Take 1 tablet by mouth 2 (two) times daily.       . cholecalciferol (VITAMIN D) 1000 UNITS tablet Take 1,000 Units by mouth 2 (two) times daily.       Marland Kitchen desoximetasone (TOPICORT) 0.05 % cream Apply 1 application topically 2 (two) times daily as needed. When skin starts peeling      . docusate sodium (COLACE) 100 MG capsule Take 1 capsule (100 mg total) by mouth 2 (two) times daily.  60 capsule  3  . fenofibrate (TRICOR) 145 MG tablet TAKE ONE (1) TABLET EACH DAY  90 tablet  3  . ferrous fumarate (HEMOCYTE - 106 MG FE) 325 (106 FE) MG TABS tablet Take 1 tablet by mouth 2 (two) times a week. Take with Vitamin C tablet      . fluticasone (FLONASE) 50 MCG/ACT nasal  spray Place 2 sprays into the nose daily as needed for rhinitis. For nasal congestion      . furosemide (LASIX) 40 MG tablet 2 tablets (37m) daily  60 tablet  3  . glucagon (GLUCAGON EMERGENCY) 1 MG injection Inject 1 mg into the vein once as needed (for severe reaction).      . Insulin Human (INSULIN PUMP) 100 unit/ml SOLN Inject into the skin continuous. Apidra Basal Rate midnight-400 : 0.3 400-700: 0.25 700-830: 2.5 830- 1230: 3.0 1230-1900: 2.5 1900-2400: 3.2      . isosorbide mononitrate (IMDUR) 60 MG 24 hr tablet 1 and 1/2 tablets (total 920m daily  45 tablet  6  . Magnesium 250 MG TABS Take 250 mg  by mouth daily.       . metoprolol (LOPRESSOR) 50 MG tablet Take 50 mg by mouth. 1 1/2 TAB TWICE DAILY      . Multiple Vitamins-Minerals (MULTIVITAMIN WITH MINERALS) tablet Take 1 tablet by mouth daily.        . nitroGLYCERIN (NITROLINGUAL) 0.4 MG/SPRAY spray Place 1 spray under the tongue every 5 (five) minutes as needed for chest pain.      Marland Kitchen omeprazole (PRILOSEC OTC) 20 MG tablet Take 20 mg by mouth daily.      . polyethylene glycol (MIRALAX / GLYCOLAX) packet Take 8.5 g by mouth daily as needed. For constipation      . potassium chloride SA (K-DUR,KLOR-CON) 20 MEQ tablet TAKE TWO (2) TABLETS BY MOUTH DAILY  60 tablet  5  . pravastatin (PRAVACHOL) 10 MG tablet TAKE ONE (1) TABLET BY MOUTH EVERY DAY  90 tablet  1  . vitamin C (ASCORBIC ACID) 500 MG tablet Take 250 mg by mouth daily.       . Wheat Dextrin (BENEFIBER PO) Take by mouth. 3 tablespoons each morning      . ZETIA 10 MG tablet TAKE ONE TABLET BY MOUTH EVERY NIGHT AT BEDTIME  90 tablet  3   No current facility-administered medications for this visit.    BP 124/64  Pulse 78  Ht 5' 3"  (1.6 m)  Wt 122 lb (55.339 kg)  BMI 21.62 kg/m2 General: NAD Neck: JVP 7 cm, no thyromegaly or thyroid nodule.  Lungs: Clear to auscultation bilaterally with normal respiratory effort. CV: Nondisplaced PMI.  Heart regular S1/S2, no X9/J4, 2/6  systolic murmur RUSB.  No peripheral edema.  Left carotid bruit.  2+ PT pulses bilaterally.  Abdomen: Soft, nontender, no hepatosplenomegaly, no distention.  Skin: Intact without lesions or rashes.  Neurologic: Alert and oriented x 3.  Psych: Normal affect. Extremities: No clubbing or cyanosis.   Assessment/Plan: 1. CAD: s/p SVG-RCA in 12/05, then redo SVG-RCA in 1/11.  She was found to have distal left main/proximal LAD severe stenosis in 9/14.  She was very symptomatic.  PCI with DES to distal LM/proximal LAD was done rather than bypass as she would have been a poor candidate for redo sternotomy (2 prior sternotomies and history of chest wall radiation for Hodgkins disease).  She will need to be on Brilinta and ASA (poor responder to plavix) long-term.  This is complicated by suspected slow GI bleed. So far, this has been controlled with periodic transfusions (hemoglobin has been stable recently). She has atypical chest pain and uses NTG about once every 2 wks.  ETT-Cardiolite in 4/15 showed no ischemia or infarction on perfusion images but there were ECG changes in recovery and she had mild chest pain.  Imdur increase has helped.  She may have microvascular angina as the cause of her symptoms.   - Continue ASA 81, statin, metoprolol, Brilinta, Imdur without changes.   - I considered ranolazine but unfortunately this is contraindicated with cirrhosis.  2. Bioprosthetic aortic valve replacement: Stable on last echo.  3. Diastolic CHF: Elevated left and right heart filling pressures on RHC prior to recent coronary intervention.  Stable dyspnea (NYHA class II), no volume overload on exam, weight is down 2 lbs.   - Continue current Lasix.  4. CKD:  Patient has a single kidney s/p right nephrectomy.  She has been referred to nephrology. Creatinine has been stable. Check BMET today.  5. Hyperlipidemia: She has not been able to tolerate any higher  potency statin than pravastatin 10 mg daily.  She is on  Zetia. LDL was 106 in 1/15.  She still has myalgias on low dose pravastatin.  I am going to have her seen in our lipid clinic to consider started a PCSK9 inhibitor.  6. Anemia: Suspected slow GI bleed.  Recent unremarkable EGD and capsule endoscopy in fall 2014.  Unfortunately, needs to stay on Brilinta.  Will need to follow closely (sees hematology), plan for transfusion with hemoglobin < 8 as this allows best control of her symptoms. Recent CBC looked good.   7. Atrial fibrillation: Paroxysmal, brief episodes noted on PCM interrogation.  Given GI bleeding and need to be on both ASA 81 and Brilinta, risks likely outweigh benefits of anticoagulation.  8. Carotid bruit: Mild to moderate bilateral stenosis.  Repeat in 1 year (3/16)    [name] 02/22/2014

## 2014-02-23 ENCOUNTER — Telehealth: Payer: Self-pay | Admitting: Cardiology

## 2014-02-23 NOTE — Telephone Encounter (Signed)
New message     Patient want her most recent cholesterol numbers

## 2014-02-23 NOTE — Telephone Encounter (Signed)
Spoke with patient about recent lab results 

## 2014-02-26 ENCOUNTER — Encounter: Payer: Self-pay | Admitting: Cardiology

## 2014-03-02 ENCOUNTER — Ambulatory Visit: Payer: BC Managed Care – PPO | Admitting: Pharmacist

## 2014-03-02 ENCOUNTER — Encounter: Payer: Self-pay | Admitting: Cardiology

## 2014-03-08 ENCOUNTER — Other Ambulatory Visit: Payer: Self-pay | Admitting: Cardiology

## 2014-03-11 ENCOUNTER — Encounter: Payer: Self-pay | Admitting: Oncology

## 2014-03-16 ENCOUNTER — Ambulatory Visit (INDEPENDENT_AMBULATORY_CARE_PROVIDER_SITE_OTHER): Payer: BC Managed Care – PPO | Admitting: Pharmacist

## 2014-03-16 VITALS — Wt 123.0 lb

## 2014-03-16 DIAGNOSIS — E785 Hyperlipidemia, unspecified: Secondary | ICD-10-CM

## 2014-03-16 NOTE — Progress Notes (Signed)
Patient is a pleasant 63 y.o. WF referred to lipid clinic by Dr. Aundra Dubin to consider her for PCSK-9 inhibitor therapy.  Patient has a h/o CABG with SVG to RCA in 06/2004, followed by redo SVG to RCA in 07/2009.  She also has a h/o DES from left main into proximal LAD.  She has a h/o diabetes also.  She has failed multiple lipid lowering medications in the past due to muscle aches and liver enzyme elevations.  She has failed pravastatin > 10 mg qd, Crestor 10-40 mg qd, Simvastatin,  Atorvastatin, and cholestyramine.  She was under the care of Dr. Lattie Haw and being seen by [facility].  She is currently able to tolerate pravastatin 10 mg with only mild muscle aches, but any higher dose has been intolerable.  She is also taking Zetia 10 mg qd and fenofibrate 145 mg qd without issue.  LDL in 07/2013 by PCP was 106 mg/dL on the combination of pravastatin 10 mg qd, Zetia, and fenofibrate.  Patient failed BAS in past due to elevated LFTs, and due to h/o IBS and gastroparesis, will not consider rechallenging her with BAS.  Patient has a h/o Hodgkins lymphoma in 1981 which she received radiation for.  This was curative.  In 2008, patient was found to have a tumor in her right kidney on ultrasound.  The tumor was isolated to the kidney, and had her right kidney removed in 2008.  She's had CT scan yearly since 2008 all showing there is no recurrence of cancer.    RF:  CABG with SVG to RCA in 06/2004, followed by redo SVG to RCA in 07/2009; h/o PCI, diabetes, HTN, age - LDL goal < 70, non-HDL goal < 100 Meds:  Pravastatin 10 mg qd, Zetia 10 mg qd, Fenofibrate 145 mg qd Intolerant:  he has failed pravastatin 20-40 mg qd (myalgias), Crestor 10-40 mg qd (myalgias), Simvastatin (elevated LFTs),  Atorvastatin (elevated LFTs), and cholestyramine (elevated LFTs per patient).  She was under the care of Dr. Lattie Haw and being seen by [facility] at time of failing these.  Labs: 02/2014:  TC 184, TG 182, HDL 41, LDL 106,  LFTs okay (Pravastatin 10 mg qd, Zetia 10 mg qd, Fenofibrate 145 mg qd)  Current Outpatient Prescriptions  Medication Sig Dispense Refill  . ALPRAZolam (XANAX) 0.5 MG tablet Take 0.25 mg by mouth 2 (two) times daily as needed.       Marland Kitchen aminocaproic acid (AMICAR) 25 % solution Take 10 ml and hold in mouth x 2 minutes then spit every 6 hours x 2 days prn gums bleeding post dental cleaning.  400 mL  0  . aspirin EC 81 MG tablet Take 81 mg by mouth at bedtime.      Marland Kitchen azithromycin (ZITHROMAX) 500 MG tablet 1 tablet by mouth one hour prior to dental appointment/procedure  2 tablet  0  . BRILINTA 90 MG TABS tablet TAKE ONE TABLET TWICE DAILY  60 tablet  6  . Calcium Carbonate-Vitamin D (RA CALCIUM PLUS VITAMIN D) 600-400 MG-UNIT per tablet Take 1 tablet by mouth 2 (two) times daily.       . cholecalciferol (VITAMIN D) 1000 UNITS tablet Take 1,000 Units by mouth 2 (two) times daily.       Marland Kitchen desoximetasone (TOPICORT) 0.05 % cream Apply 1 application topically 2 (two) times daily as needed. When skin starts peeling      . docusate sodium (COLACE) 100 MG capsule Take 1 capsule (100 mg total) by  mouth 2 (two) times daily.  60 capsule  3  . fenofibrate (TRICOR) 145 MG tablet TAKE ONE (1) TABLET EACH DAY  90 tablet  3  . ferrous fumarate (HEMOCYTE - 106 MG FE) 325 (106 FE) MG TABS tablet Take 1 tablet by mouth 2 (two) times a week. Take with Vitamin C tablet      . fluticasone (FLONASE) 50 MCG/ACT nasal spray Place 2 sprays into the nose daily as needed for rhinitis. For nasal congestion      . furosemide (LASIX) 40 MG tablet TAKE TWO TABLETS ONCE DAILY  60 tablet  3  . glucagon (GLUCAGON EMERGENCY) 1 MG injection Inject 1 mg into the vein once as needed (for severe reaction).      . Insulin Human (INSULIN PUMP) 100 unit/ml SOLN Inject into the skin continuous. Apidra Basal Rate midnight-400 : 0.3 400-700: 0.25 700-830: 2.5 830- 1230: 3.0 1230-1900: 2.5 1900-2400: 3.2      . isosorbide mononitrate (IMDUR)  60 MG 24 hr tablet 1 and 1/2 tablets (total 71m) daily  45 tablet  6  . Magnesium 250 MG TABS Take 250 mg by mouth daily.       . metoprolol (LOPRESSOR) 50 MG tablet Take 50 mg by mouth. 1 1/2 TAB TWICE DAILY      . Multiple Vitamins-Minerals (MULTIVITAMIN WITH MINERALS) tablet Take 1 tablet by mouth daily.        . nitroGLYCERIN (NITROLINGUAL) 0.4 MG/SPRAY spray Place 1 spray under the tongue every 5 (five) minutes as needed for chest pain.      .Marland Kitchenomeprazole (PRILOSEC OTC) 20 MG tablet Take 20 mg by mouth daily.      . polyethylene glycol (MIRALAX / GLYCOLAX) packet Take 8.5 g by mouth daily as needed. For constipation      . potassium chloride SA (K-DUR,KLOR-CON) 20 MEQ tablet TAKE TWO (2) TABLETS BY MOUTH DAILY  60 tablet  5  . pravastatin (PRAVACHOL) 10 MG tablet TAKE ONE (1) TABLET BY MOUTH EVERY DAY  90 tablet  1  . vitamin C (ASCORBIC ACID) 500 MG tablet Take 250 mg by mouth daily.       . Wheat Dextrin (BENEFIBER PO) Take by mouth. 3 tablespoons each morning      . ZETIA 10 MG tablet TAKE ONE TABLET BY MOUTH EVERY NIGHT AT BEDTIME  90 tablet  3   No current facility-administered medications for this visit.   Allergies  Allergen Reactions  . Clindamycin/Lincomycin Anaphylaxis and Shortness Of Breath  . Penicillins Anaphylaxis, Hives and Rash    Childhood allergy   . Gentamycin [Gentamicin] Hives, Itching and Rash    Reaction noted post loop recorder implant, where gentamycin was used for irrigation. Topical prep  . Tizanidine Other (See Comments)    Hallucinate  . Atorvastatin Other (See Comments)     increased LFT's  . Avandia [Rosiglitazone Maleate]     Congestive heart failure  . Cholestyramine     Elevated LFTs  . Codeine Nausea And Vomiting  . Crestor [Rosuvastatin] Other (See Comments)    Muscle and joint pain &  . Erythromycin Nausea And Vomiting  . Metformin     Congestive heart failure  . Novolog [Insulin Aspart] Itching    Patient reports she has severe itching  with Novolog insulin  . Plavix [Clopidogrel Bisulfate]     P2Y12 testing = 271 while on Plavix  . Plavix [Clopidogrel]     Does not work for patient  .  Pravastatin Other (See Comments)    In high doses (20-61m) causes muscle and joint pain   . Simvastatin     Increased LFT's  . Tamiflu [Oseltamivir] Nausea And Vomiting  . Cephalexin Swelling and Rash    Extremities swell   . Iodinated Diagnostic Agents Other (See Comments)    PAIN DURING LYMPHANGIOGRAM '86, NO ALLERGY TO IV CONTRAST  . Neosporin [Neomycin-Bacitracin Zn-Polymyx] Rash   Family History  Problem Relation Age of Onset  . Heart attack Mother   . Heart disease Mother   . Heart attack Father   . Diabetes Father   . Heart disease Father   . Hypertension Brother   . Glaucoma Brother   . Lung cancer Sister     meta  . Breast cancer Sister     half sister (Paternal)  . Ovarian cancer Sister   . Cervical cancer Sister     half sister (maternal)  . Breast cancer Sister   . Colon cancer Neg Hx   . Stomach cancer Neg Hx   . Lung cancer Sister     half sister (maternal)

## 2014-03-16 NOTE — Assessment & Plan Note (Signed)
Patient and I discussed PCSK-9 inhibitor therapy at length.  We discussed trying to get her started on Praluent vs enrolling her into SPIRE-II.  She understands that the study is placebo controlled and likely 4 years in length.  She has [employer] currently, but is likely going to lose this and go on to Medicare next year.  She would like to wait until her insurance is straightened out and the process for getting on Praluent is straightened out before she starts on this.  She is very excited to enroll into SPIRE clinical trial as there will be no cost associated with the medication, and her insurance changes won't impact her PCSK-9 inhibitor use.  Her h/o of cancer does not appear to have a potential for recurrence so this should not be an issue for SPIRE.  Her past medical history and inability to tolerate anything more than pravastatin 10 mg qd should get her into study.  She is going to see her PCP tomorrow, and she is going to try and get appropriate records forwarded to me that show which statins / dose she failed, the dates she took them, and the reason they were stopped.  She may be needed to get enrolled.  I've passed her information to [employer] to contact.

## 2014-03-23 ENCOUNTER — Telehealth: Payer: Self-pay | Admitting: Cardiology

## 2014-03-23 NOTE — Telephone Encounter (Signed)
New problem   Pt forgot to take her morning medication Metoprolol and Brilinta pt need to know what does she need to do. Please call pt

## 2014-03-23 NOTE — Telephone Encounter (Signed)
LMTCB

## 2014-03-24 MED ORDER — PRAVASTATIN SODIUM 10 MG PO TABS: ORAL_TABLET | ORAL | Status: DC

## 2014-03-24 NOTE — Telephone Encounter (Signed)
Make sure she takes all her meds.  Would try to avoid very high altitudes.  Would take copy of medical record, she can pick up copy of my last note from office or can mail.

## 2014-03-24 NOTE — Telephone Encounter (Signed)
Spoke with patient about medication and questions answered.   Pt mentioned that she was travelling to [place] by airplane on Saturday. We discussed getting up and walking around every 1-2 hours while on the airplane.  Once she gets to [place] she will be travelling around the state by train and boat, visiting the site of a [place].  She is not sure of the highest elevation during her trip but she does not plan to do any strenuous exercise like hiking or kayaking but will be walking around towns seeing the sites.   Pt asking if there are any restrictions/limitations she should follow while on her trip.  I will forward to [name] for review.

## 2014-03-24 NOTE — Telephone Encounter (Signed)
What do you consider very high altitudes?

## 2014-03-24 NOTE — Telephone Encounter (Signed)
Discussed not going to high altitudes and limiting strenuous activity if she is going to be travelling to high altitudes.  I am faxing last office note and EKG to pt's home fax.

## 2014-04-18 ENCOUNTER — Telehealth: Payer: Self-pay | Admitting: Cardiology

## 2014-04-18 NOTE — Telephone Encounter (Signed)
Pt took my mistake an extra 80 mg lasix last pm and 40 meq K+ - she was up voiding during the night - we will hold her lasix and K+ today. Resume as usual tomorrow. She was reassured.   She will call if any problems.

## 2014-04-30 ENCOUNTER — Telehealth: Payer: Self-pay | Admitting: Cardiology

## 2014-04-30 MED ORDER — ISOSORBIDE MONONITRATE ER 60 MG PO TB24: 60.0000 mg | ORAL_TABLET | Freq: Two times a day (BID) | ORAL | Status: DC

## 2014-04-30 NOTE — Telephone Encounter (Signed)
New message     Pt is having more frequent angina---the nitrolingual is relieving it.  She is not having angina now.  It happened last night and several times on Sunday.  Want to talk to a nurse

## 2014-04-30 NOTE — Telephone Encounter (Signed)
Patient reports she is having more frequent episodes of angina. These occur when she is upset  Or laying down in bed. Gets a little more sob also. This is always relieved with Nitrolinggual spray, but she was told to call if angina episodes became more frequent. Per Truitt Merle, increase Imdur  To 74m bid.

## 2014-05-03 ENCOUNTER — Telehealth: Payer: Self-pay | Admitting: Cardiology

## 2014-05-03 NOTE — Telephone Encounter (Signed)
Discussed with Lavetta Nielsen Pharm D. [place] for patient to take Imdur 120 mg once daily, monitor blood pressure for drop. Patient verbalized understanding.

## 2014-05-03 NOTE — Telephone Encounter (Signed)
New message    Patient calling    C/O having more frequent Angina. Was told to NPA to increase to Imdur 120 mg take 60 in am 60 pm .   Need some clarification on directions./ dosage.

## 2014-05-04 ENCOUNTER — Other Ambulatory Visit: Payer: Self-pay

## 2014-05-04 DIAGNOSIS — D649 Anemia, unspecified: Secondary | ICD-10-CM

## 2014-05-04 MED ORDER — FERROUS FUMARATE 325 (106 FE) MG PO TABS: 1.0000 | ORAL_TABLET | Freq: Two times a day (BID) | ORAL | Status: DC

## 2014-05-06 ENCOUNTER — Other Ambulatory Visit: Payer: Self-pay | Admitting: *Deleted

## 2014-05-06 MED ORDER — NITROGLYCERIN 0.4 MG/SPRAY TL SOLN: 1.0000 | Status: DC | PRN

## 2014-05-10 ENCOUNTER — Encounter: Payer: Self-pay | Admitting: Internal Medicine

## 2014-05-10 ENCOUNTER — Ambulatory Visit: Payer: BC Managed Care – PPO | Admitting: Oncology

## 2014-05-10 ENCOUNTER — Other Ambulatory Visit: Payer: BC Managed Care – PPO

## 2014-05-10 ENCOUNTER — Ambulatory Visit (INDEPENDENT_AMBULATORY_CARE_PROVIDER_SITE_OTHER): Payer: BC Managed Care – PPO | Admitting: *Deleted

## 2014-05-10 DIAGNOSIS — I495 Sick sinus syndrome: Secondary | ICD-10-CM

## 2014-05-10 NOTE — Progress Notes (Signed)
Remote pacemaker transmission.   

## 2014-05-11 ENCOUNTER — Other Ambulatory Visit: Payer: Self-pay | Admitting: Oncology

## 2014-05-11 ENCOUNTER — Other Ambulatory Visit: Payer: Self-pay | Admitting: *Deleted

## 2014-05-11 DIAGNOSIS — D509 Iron deficiency anemia, unspecified: Secondary | ICD-10-CM

## 2014-05-11 LAB — MDC_IDC_ENUM_SESS_TYPE_REMOTE
Battery Impedance: 113 Ohm
Battery Remaining Longevity: 172 mo
Battery Voltage: 2.81 V
Brady Statistic AP VP Percent: 0 %
Brady Statistic AP VS Percent: 0 %
Brady Statistic AS VP Percent: 0 %
Date Time Interrogation Session: 20151019124544
Lead Channel Pacing Threshold Amplitude: 0.5 V
Lead Channel Pacing Threshold Amplitude: 0.75 V
Lead Channel Pacing Threshold Pulse Width: 0.4 ms
Lead Channel Pacing Threshold Pulse Width: 0.4 ms
Lead Channel Sensing Intrinsic Amplitude: 1 mV
Lead Channel Sensing Intrinsic Amplitude: 16 mV
Lead Channel Setting Pacing Pulse Width: 0.4 ms
MDC IDC MSMT LEADCHNL RA IMPEDANCE VALUE: 494 Ohm
MDC IDC MSMT LEADCHNL RV IMPEDANCE VALUE: 680 Ohm
MDC IDC SET LEADCHNL RA PACING AMPLITUDE: 1.5 V
MDC IDC SET LEADCHNL RV PACING AMPLITUDE: 2 V
MDC IDC SET LEADCHNL RV SENSING SENSITIVITY: 5.6 mV
MDC IDC STAT BRADY AS VS PERCENT: 100 %

## 2014-05-12 ENCOUNTER — Telehealth: Payer: Self-pay

## 2014-05-12 ENCOUNTER — Telehealth: Payer: Self-pay | Admitting: Oncology

## 2014-05-12 ENCOUNTER — Other Ambulatory Visit: Payer: BC Managed Care – PPO

## 2014-05-12 ENCOUNTER — Ambulatory Visit: Payer: BC Managed Care – PPO | Admitting: Oncology

## 2014-05-12 DIAGNOSIS — D649 Anemia, unspecified: Secondary | ICD-10-CM

## 2014-05-12 NOTE — Telephone Encounter (Signed)
LM to confirm d/t for Jan 2016. mailed cal

## 2014-05-12 NOTE — Telephone Encounter (Signed)
Told Ms. Smallman that Dr. Marko Plume was to see her today to make sure cbc followed up.  Ms. Outen brought labs from [facility] from recent visit.  The CBC looked good per Dr. Marko Plume.  Today's appointment can be r/s for 3 months from now for follow up if she is doing well.  Ms. Guin is feel good today.  Will reschedule appointment.  Sent orders to schedulers.

## 2014-05-17 ENCOUNTER — Telehealth: Payer: Self-pay | Admitting: Cardiology

## 2014-05-17 NOTE — Telephone Encounter (Signed)
New Message      Pt calling stating she has questions about medications as well as medication frequency. Please call pt and advise.

## 2014-05-17 NOTE — Telephone Encounter (Signed)
Imdur was increased to 165m daily 04/30/14 because of increase in angina, from 2-3 times a week to 4-5 times a week.  Pt states angina is relieved with 1 NTG spray. She also associates angina with eating too much or foods that are too rich (lemon pie).  After 3-4 days, pt had to decrease Imdur to 922mdaily because of decrease in energy level and just generally feeling bad.  Pt asking if any other recommendations.  I will forward to Dr McAundra Dubinor review.

## 2014-05-17 NOTE — Telephone Encounter (Signed)
Reviewed with Dr Vassie Moment did not recommend any changes at the present time.  LMTCB for pt.

## 2014-05-18 NOTE — Telephone Encounter (Signed)
Follow up     Returning Anne's call

## 2014-05-18 NOTE — Telephone Encounter (Signed)
Pt aware to continue current treatment but to call back with any questions, concerns or needs.

## 2014-05-27 ENCOUNTER — Other Ambulatory Visit: Payer: Self-pay | Admitting: Cardiology

## 2014-06-06 ENCOUNTER — Inpatient Hospital Stay (HOSPITAL_COMMUNITY)
Admission: EM | Admit: 2014-06-06 | Discharge: 2014-06-08 | DRG: 247 | Disposition: A | Discharge status: Home / Self Care | Payer: BC Managed Care – PPO | Attending: Cardiovascular Disease | Admitting: Cardiovascular Disease

## 2014-06-06 ENCOUNTER — Emergency Department (HOSPITAL_COMMUNITY): Payer: BC Managed Care – PPO

## 2014-06-06 ENCOUNTER — Encounter (HOSPITAL_COMMUNITY): Payer: Self-pay | Admitting: Emergency Medicine

## 2014-06-06 DIAGNOSIS — R079 Chest pain, unspecified: Secondary | ICD-10-CM | POA: Diagnosis present

## 2014-06-06 DIAGNOSIS — I350 Nonrheumatic aortic (valve) stenosis: Secondary | ICD-10-CM

## 2014-06-06 DIAGNOSIS — I5032 Chronic diastolic (congestive) heart failure: Secondary | ICD-10-CM | POA: Diagnosis present

## 2014-06-06 DIAGNOSIS — Z950 Presence of cardiac pacemaker: Secondary | ICD-10-CM | POA: Diagnosis not present

## 2014-06-06 DIAGNOSIS — Z794 Long term (current) use of insulin: Secondary | ICD-10-CM

## 2014-06-06 DIAGNOSIS — N183 Chronic kidney disease, stage 3 (moderate): Secondary | ICD-10-CM | POA: Diagnosis present

## 2014-06-06 DIAGNOSIS — Z951 Presence of aortocoronary bypass graft: Secondary | ICD-10-CM

## 2014-06-06 DIAGNOSIS — I480 Paroxysmal atrial fibrillation: Secondary | ICD-10-CM | POA: Diagnosis present

## 2014-06-06 DIAGNOSIS — K7460 Unspecified cirrhosis of liver: Secondary | ICD-10-CM | POA: Diagnosis present

## 2014-06-06 DIAGNOSIS — I959 Hypotension, unspecified: Secondary | ICD-10-CM | POA: Diagnosis not present

## 2014-06-06 DIAGNOSIS — I2510 Atherosclerotic heart disease of native coronary artery without angina pectoris: Secondary | ICD-10-CM | POA: Diagnosis present

## 2014-06-06 DIAGNOSIS — Y718 Miscellaneous cardiovascular devices associated with adverse incidents, not elsewhere classified: Secondary | ICD-10-CM | POA: Diagnosis present

## 2014-06-06 DIAGNOSIS — Z953 Presence of xenogenic heart valve: Secondary | ICD-10-CM

## 2014-06-06 DIAGNOSIS — E785 Hyperlipidemia, unspecified: Secondary | ICD-10-CM | POA: Diagnosis present

## 2014-06-06 DIAGNOSIS — I129 Hypertensive chronic kidney disease with stage 1 through stage 4 chronic kidney disease, or unspecified chronic kidney disease: Secondary | ICD-10-CM | POA: Diagnosis present

## 2014-06-06 DIAGNOSIS — R21 Rash and other nonspecific skin eruption: Secondary | ICD-10-CM | POA: Diagnosis not present

## 2014-06-06 DIAGNOSIS — E109 Type 1 diabetes mellitus without complications: Secondary | ICD-10-CM | POA: Diagnosis present

## 2014-06-06 DIAGNOSIS — Z955 Presence of coronary angioplasty implant and graft: Secondary | ICD-10-CM

## 2014-06-06 DIAGNOSIS — Z905 Acquired absence of kidney: Secondary | ICD-10-CM | POA: Diagnosis present

## 2014-06-06 DIAGNOSIS — I200 Unstable angina: Secondary | ICD-10-CM | POA: Diagnosis present

## 2014-06-06 DIAGNOSIS — K219 Gastro-esophageal reflux disease without esophagitis: Secondary | ICD-10-CM | POA: Diagnosis present

## 2014-06-06 DIAGNOSIS — Z85528 Personal history of other malignant neoplasm of kidney: Secondary | ICD-10-CM | POA: Diagnosis not present

## 2014-06-06 DIAGNOSIS — Z79899 Other long term (current) drug therapy: Secondary | ICD-10-CM

## 2014-06-06 DIAGNOSIS — T82858A Stenosis of vascular prosthetic devices, implants and grafts, initial encounter: Secondary | ICD-10-CM | POA: Diagnosis present

## 2014-06-06 DIAGNOSIS — Z8571 Personal history of Hodgkin lymphoma: Secondary | ICD-10-CM | POA: Diagnosis not present

## 2014-06-06 DIAGNOSIS — I10 Essential (primary) hypertension: Secondary | ICD-10-CM | POA: Diagnosis present

## 2014-06-06 DIAGNOSIS — M810 Age-related osteoporosis without current pathological fracture: Secondary | ICD-10-CM | POA: Diagnosis present

## 2014-06-06 DIAGNOSIS — E118 Type 2 diabetes mellitus with unspecified complications: Secondary | ICD-10-CM

## 2014-06-06 DIAGNOSIS — I25110 Atherosclerotic heart disease of native coronary artery with unstable angina pectoris: Secondary | ICD-10-CM | POA: Diagnosis present

## 2014-06-06 HISTORY — DX: Syncope and collapse: R55

## 2014-06-06 HISTORY — DX: Chronic kidney disease, stage 2 (mild): N18.2

## 2014-06-06 HISTORY — DX: Paroxysmal atrial fibrillation: I48.0

## 2014-06-06 LAB — CBC WITH DIFFERENTIAL/PLATELET
Basophils Absolute: 0 10*3/uL (ref 0.0–0.1)
Basophils Relative: 0 % (ref 0–1)
EOS PCT: 2 % (ref 0–5)
Eosinophils Absolute: 0.2 10*3/uL (ref 0.0–0.7)
HEMATOCRIT: 37.5 % (ref 36.0–46.0)
Hemoglobin: 12.2 g/dL (ref 12.0–15.0)
LYMPHS ABS: 1.7 10*3/uL (ref 0.7–4.0)
Lymphocytes Relative: 20 % (ref 12–46)
MCH: 29.9 pg (ref 26.0–34.0)
MCHC: 32.5 g/dL (ref 30.0–36.0)
MCV: 91.9 fL (ref 78.0–100.0)
Monocytes Absolute: 0.6 10*3/uL (ref 0.1–1.0)
Monocytes Relative: 7 % (ref 3–12)
Neutro Abs: 6.3 10*3/uL (ref 1.7–7.7)
Neutrophils Relative %: 71 % (ref 43–77)
Platelets: 141 10*3/uL — ABNORMAL LOW (ref 150–400)
RBC: 4.08 MIL/uL (ref 3.87–5.11)
RDW: 13.6 % (ref 11.5–15.5)
WBC: 8.9 10*3/uL (ref 4.0–10.5)

## 2014-06-06 LAB — CBC
HCT: 35.7 % — ABNORMAL LOW (ref 36.0–46.0)
HCT: 37.2 % (ref 36.0–46.0)
HEMOGLOBIN: 11.6 g/dL — AB (ref 12.0–15.0)
Hemoglobin: 11.9 g/dL — ABNORMAL LOW (ref 12.0–15.0)
MCH: 29.9 pg (ref 26.0–34.0)
MCH: 29.8 pg (ref 26.0–34.0)
MCHC: 32.5 g/dL (ref 30.0–36.0)
MCHC: 32 g/dL (ref 30.0–36.0)
MCV: 92 fL (ref 78.0–100.0)
MCV: 93 fL (ref 78.0–100.0)
PLATELETS: 142 10*3/uL — AB (ref 150–400)
Platelets: 143 10*3/uL — ABNORMAL LOW (ref 150–400)
RBC: 3.88 MIL/uL (ref 3.87–5.11)
RBC: 4 MIL/uL (ref 3.87–5.11)
RDW: 13.7 % (ref 11.5–15.5)
RDW: 13.8 % (ref 11.5–15.5)
WBC: 7.5 10*3/uL (ref 4.0–10.5)
WBC: 7.2 10*3/uL (ref 4.0–10.5)

## 2014-06-06 LAB — BASIC METABOLIC PANEL
ANION GAP: 13 (ref 5–15)
ANION GAP: 16 — AB (ref 5–15)
BUN: 28 mg/dL — ABNORMAL HIGH (ref 6–23)
BUN: 30 mg/dL — ABNORMAL HIGH (ref 6–23)
CALCIUM: 10 mg/dL (ref 8.4–10.5)
CALCIUM: 10 mg/dL (ref 8.4–10.5)
CO2: 25 mEq/L (ref 19–32)
CO2: 24 meq/L (ref 19–32)
Chloride: 101 mEq/L (ref 96–112)
Chloride: 100 mEq/L (ref 96–112)
Creatinine, Ser: 1.06 mg/dL (ref 0.50–1.10)
Creatinine, Ser: 1.25 mg/dL — ABNORMAL HIGH (ref 0.50–1.10)
GFR calc Af Amer: 64 mL/min — ABNORMAL LOW (ref >90)
GFR calc Af Amer: 53 mL/min — ABNORMAL LOW (ref >90)
GFR calc non Af Amer: 55 mL/min — ABNORMAL LOW (ref >90)
GFR calc non Af Amer: 45 mL/min — ABNORMAL LOW (ref >90)
GLUCOSE: 208 mg/dL — AB (ref 70–99)
GLUCOSE: 146 mg/dL — AB (ref 70–99)
POTASSIUM: 4 meq/L (ref 3.7–5.3)
POTASSIUM: 4 meq/L (ref 3.7–5.3)
SODIUM: 139 meq/L (ref 137–147)
SODIUM: 140 meq/L (ref 137–147)

## 2014-06-06 LAB — PROTIME-INR
INR: 1.1 (ref 0.00–1.49)
INR: 1.21 (ref 0.00–1.49)
Prothrombin Time: 14.3 seconds (ref 11.6–15.2)
Prothrombin Time: 15.4 seconds — ABNORMAL HIGH (ref 11.6–15.2)

## 2014-06-06 LAB — GLUCOSE, CAPILLARY
GLUCOSE-CAPILLARY: 238 mg/dL — AB (ref 70–99)
GLUCOSE-CAPILLARY: 134 mg/dL — AB (ref 70–99)
GLUCOSE-CAPILLARY: 143 mg/dL — AB (ref 70–99)
Glucose-Capillary: 144 mg/dL — ABNORMAL HIGH (ref 70–99)
Glucose-Capillary: 232 mg/dL — ABNORMAL HIGH (ref 70–99)

## 2014-06-06 LAB — I-STAT CHEM 8, ED
BUN: 31 mg/dL — AB (ref 6–23)
CREATININE: 1.2 mg/dL — AB (ref 0.50–1.10)
Calcium, Ion: 1.17 mmol/L (ref 1.13–1.30)
Chloride: 105 mEq/L (ref 96–112)
Glucose, Bld: 289 mg/dL — ABNORMAL HIGH (ref 70–99)
HCT: 39 % (ref 36.0–46.0)
Hemoglobin: 13.3 g/dL (ref 12.0–15.0)
Potassium: 3.8 mEq/L (ref 3.7–5.3)
SODIUM: 141 meq/L (ref 137–147)
TCO2: 24 mmol/L (ref 0–100)

## 2014-06-06 LAB — I-STAT TROPONIN, ED: Troponin i, poc: 0.01 ng/mL (ref 0.00–0.08)

## 2014-06-06 LAB — HEMOGLOBIN A1C
Hgb A1c MFr Bld: 7.9 % — ABNORMAL HIGH (ref <5.7)
MEAN PLASMA GLUCOSE: 180 mg/dL — AB (ref <117)

## 2014-06-06 LAB — LIPID PANEL
CHOLESTEROL: 151 mg/dL (ref 0–200)
HDL: 35 mg/dL — ABNORMAL LOW (ref >39)
LDL Cholesterol: 60 mg/dL (ref 0–99)
TRIGLYCERIDES: 279 mg/dL — AB (ref <150)
Total CHOL/HDL Ratio: 4.3 RATIO
VLDL: 56 mg/dL — ABNORMAL HIGH (ref 0–40)

## 2014-06-06 LAB — PLATELET INHIBITION P2Y12: Platelet Function  P2Y12: 193 [PRU] — ABNORMAL LOW (ref 194–418)

## 2014-06-06 LAB — HEPARIN LEVEL (UNFRACTIONATED)
Heparin Unfractionated: 0.17 IU/mL — ABNORMAL LOW (ref 0.30–0.70)
Heparin Unfractionated: 0.25 IU/mL — ABNORMAL LOW (ref 0.30–0.70)

## 2014-06-06 MED ORDER — CALCIUM CARBONATE-VITAMIN D 500-200 MG-UNIT PO TABS
1.0000 | ORAL_TABLET | Freq: Two times a day (BID) | ORAL | Status: DC
  Administered 2014-06-06 – 2014-06-08 (×4): 1 via ORAL
  Filled 2014-06-06 (×7): qty 1

## 2014-06-06 MED ORDER — HEPARIN BOLUS VIA INFUSION
1500.0000 [IU] | Freq: Once | INTRAVENOUS | Status: AC
  Administered 2014-06-06: 1500 [IU] via INTRAVENOUS
  Filled 2014-06-06: qty 1500

## 2014-06-06 MED ORDER — FLUTICASONE PROPIONATE 50 MCG/ACT NA SUSP: 2.0000 | Freq: Every day | NASAL | Status: DC | PRN

## 2014-06-06 MED ORDER — POTASSIUM CHLORIDE CRYS ER 20 MEQ PO TBCR
20.0000 meq | EXTENDED_RELEASE_TABLET | Freq: Every day | ORAL | Status: DC
  Administered 2014-06-06 – 2014-06-08 (×2): 20 meq via ORAL
  Filled 2014-06-06 (×3): qty 1

## 2014-06-06 MED ORDER — SODIUM CHLORIDE 0.9 % IV SOLN
INTRAVENOUS | Status: DC
Start: 2014-06-07 — End: 2014-06-07
  Administered 2014-06-07: 07:00:00 via INTRAVENOUS

## 2014-06-06 MED ORDER — INSULIN REGULAR HUMAN 100 UNIT/ML IJ SOLN
0.0000 [IU] | Freq: Three times a day (TID) | INTRAMUSCULAR | Status: DC
  Filled 2014-06-06 (×25): qty 0.09

## 2014-06-06 MED ORDER — ONDANSETRON HCL 4 MG/2ML IJ SOLN
4.0000 mg | Freq: Once | INTRAMUSCULAR | Status: AC
  Administered 2014-06-06: 4 mg via INTRAVENOUS
  Filled 2014-06-06: qty 2

## 2014-06-06 MED ORDER — ASPIRIN EC 81 MG PO TBEC
81.0000 mg | DELAYED_RELEASE_TABLET | Freq: Every day | ORAL | Status: DC
  Filled 2014-06-06: qty 1

## 2014-06-06 MED ORDER — ISOSORBIDE MONONITRATE ER 60 MG PO TB24
90.0000 mg | ORAL_TABLET | Freq: Every day | ORAL | Status: DC
  Filled 2014-06-06: qty 1

## 2014-06-06 MED ORDER — SODIUM CHLORIDE 0.9 % IJ SOLN: 3.0000 mL | INTRAMUSCULAR | Status: DC | PRN

## 2014-06-06 MED ORDER — METOPROLOL TARTRATE 50 MG PO TABS
75.0000 mg | ORAL_TABLET | Freq: Two times a day (BID) | ORAL | Status: DC
  Administered 2014-06-06 – 2014-06-08 (×4): 75 mg via ORAL
  Filled 2014-06-06 (×7): qty 1

## 2014-06-06 MED ORDER — VITAMIN D3 25 MCG (1000 UNIT) PO TABS
1000.0000 [IU] | ORAL_TABLET | Freq: Every day | ORAL | Status: DC
  Administered 2014-06-06 – 2014-06-08 (×3): 1000 [IU] via ORAL
  Filled 2014-06-06 (×3): qty 1

## 2014-06-06 MED ORDER — NITROGLYCERIN 0.4 MG SL SUBL: 0.4000 mg | SUBLINGUAL_TABLET | SUBLINGUAL | Status: DC | PRN

## 2014-06-06 MED ORDER — ACETAMINOPHEN 325 MG PO TABS: 650.0000 mg | ORAL_TABLET | ORAL | Status: DC | PRN

## 2014-06-06 MED ORDER — ONDANSETRON HCL 4 MG/2ML IJ SOLN
4.0000 mg | Freq: Four times a day (QID) | INTRAMUSCULAR | Status: DC | PRN
Start: 2014-06-06 — End: 2014-06-08

## 2014-06-06 MED ORDER — TICAGRELOR 90 MG PO TABS
90.0000 mg | ORAL_TABLET | Freq: Two times a day (BID) | ORAL | Status: DC
  Administered 2014-06-06 – 2014-06-08 (×4): 90 mg via ORAL
  Filled 2014-06-06 (×6): qty 1

## 2014-06-06 MED ORDER — PANTOPRAZOLE SODIUM 40 MG PO TBEC
40.0000 mg | DELAYED_RELEASE_TABLET | Freq: Every day | ORAL | Status: DC
  Administered 2014-06-06 – 2014-06-07 (×2): 40 mg via ORAL
  Filled 2014-06-06 (×2): qty 1

## 2014-06-06 MED ORDER — ASPIRIN EC 81 MG PO TBEC: 81.0000 mg | DELAYED_RELEASE_TABLET | Freq: Every day | ORAL | Status: DC

## 2014-06-06 MED ORDER — ISOSORBIDE MONONITRATE ER 60 MG PO TB24
90.0000 mg | ORAL_TABLET | Freq: Every day | ORAL | Status: DC
  Administered 2014-06-06 – 2014-06-07 (×2): 90 mg via ORAL
  Filled 2014-06-06 (×3): qty 1

## 2014-06-06 MED ORDER — HEPARIN (PORCINE) IN NACL 100-0.45 UNIT/ML-% IJ SOLN
900.0000 [IU]/h | INTRAMUSCULAR | Status: DC
  Administered 2014-06-06: 650 [IU]/h via INTRAVENOUS
  Administered 2014-06-07: 900 [IU]/h via INTRAVENOUS
  Filled 2014-06-06 (×2): qty 250

## 2014-06-06 MED ORDER — NITROGLYCERIN 0.4 MG/SPRAY TL SOLN: 1.0000 | Status: DC | PRN

## 2014-06-06 MED ORDER — FENOFIBRATE 160 MG PO TABS
160.0000 mg | ORAL_TABLET | Freq: Every day | ORAL | Status: DC
  Administered 2014-06-06 – 2014-06-07 (×2): 160 mg via ORAL
  Filled 2014-06-06 (×3): qty 1

## 2014-06-06 MED ORDER — PRAVASTATIN SODIUM 10 MG PO TABS
10.0000 mg | ORAL_TABLET | Freq: Every day | ORAL | Status: DC
  Filled 2014-06-06: qty 1

## 2014-06-06 MED ORDER — FENOFIBRATE 160 MG PO TABS
160.0000 mg | ORAL_TABLET | Freq: Every day | ORAL | Status: DC
  Filled 2014-06-06: qty 1

## 2014-06-06 MED ORDER — CLOBETASOL PROPIONATE 0.05 % EX SOLN: 1.0000 "application " | Freq: Every day | CUTANEOUS | Status: DC | PRN

## 2014-06-06 MED ORDER — TRIAMCINOLONE ACETONIDE 0.025 % EX CREA
TOPICAL_CREAM | Freq: Two times a day (BID) | CUTANEOUS | Status: DC
  Administered 2014-06-06: 23:00:00 via TOPICAL
  Administered 2014-06-06: 1 via TOPICAL
  Filled 2014-06-06: qty 15

## 2014-06-06 MED ORDER — ALPRAZOLAM 0.25 MG PO TABS
0.2500 mg | ORAL_TABLET | Freq: Two times a day (BID) | ORAL | Status: DC | PRN
  Administered 2014-06-07: 0.5 mg via ORAL
  Filled 2014-06-06: qty 2

## 2014-06-06 MED ORDER — OMEPRAZOLE MAGNESIUM 20 MG PO TBEC: 20.0000 mg | DELAYED_RELEASE_TABLET | Freq: Every day | ORAL | Status: DC

## 2014-06-06 MED ORDER — VITAMIN C 250 MG PO TABS
250.0000 mg | ORAL_TABLET | Freq: Every day | ORAL | Status: DC
  Administered 2014-06-06 – 2014-06-08 (×3): 250 mg via ORAL
  Filled 2014-06-06 (×3): qty 1

## 2014-06-06 MED ORDER — SODIUM CHLORIDE 0.9 % IJ SOLN: 3.0000 mL | Freq: Two times a day (BID) | INTRAMUSCULAR | Status: DC

## 2014-06-06 MED ORDER — INSULIN REGULAR HUMAN 100 UNIT/ML IJ SOLN
0.0000 [IU] | Freq: Three times a day (TID) | INTRAMUSCULAR | Status: DC
  Filled 2014-06-06 (×26): qty 10

## 2014-06-06 MED ORDER — EZETIMIBE 10 MG PO TABS
10.0000 mg | ORAL_TABLET | Freq: Every day | ORAL | Status: DC
  Administered 2014-06-06 – 2014-06-07 (×2): 10 mg via ORAL
  Filled 2014-06-06 (×3): qty 1

## 2014-06-06 MED ORDER — POLYETHYLENE GLYCOL 3350 17 G PO PACK
8.5000 g | PACK | Freq: Every day | ORAL | Status: DC | PRN
Start: 2014-06-06 — End: 2014-06-08
  Filled 2014-06-06: qty 1

## 2014-06-06 MED ORDER — MAGNESIUM 250 MG PO TABS: 250.0000 mg | ORAL_TABLET | Freq: Every day | ORAL | Status: DC

## 2014-06-06 MED ORDER — SODIUM CHLORIDE 0.9 % IV SOLN: 250.0000 mL | INTRAVENOUS | Status: DC | PRN

## 2014-06-06 MED ORDER — MAGNESIUM OXIDE 400 (241.3 MG) MG PO TABS
200.0000 mg | ORAL_TABLET | Freq: Every day | ORAL | Status: DC
  Administered 2014-06-06 – 2014-06-08 (×3): 200 mg via ORAL
  Filled 2014-06-06 (×3): qty 0.5

## 2014-06-06 MED ORDER — DOCUSATE SODIUM 100 MG PO CAPS
100.0000 mg | ORAL_CAPSULE | Freq: Two times a day (BID) | ORAL | Status: DC
  Administered 2014-06-06 – 2014-06-08 (×4): 100 mg via ORAL
  Filled 2014-06-06 (×6): qty 1

## 2014-06-06 MED ORDER — ASPIRIN 81 MG PO CHEW
81.0000 mg | CHEWABLE_TABLET | ORAL | Status: AC
  Administered 2014-06-07: 81 mg via ORAL
  Filled 2014-06-06: qty 1

## 2014-06-06 MED ORDER — ADULT MULTIVITAMIN W/MINERALS CH
1.0000 | ORAL_TABLET | Freq: Every day | ORAL | Status: DC
  Administered 2014-06-06 – 2014-06-07 (×2): 1 via ORAL
  Filled 2014-06-06 (×3): qty 1

## 2014-06-06 MED ORDER — PRAVASTATIN SODIUM 10 MG PO TABS
10.0000 mg | ORAL_TABLET | Freq: Every day | ORAL | Status: DC
  Administered 2014-06-06 – 2014-06-07 (×2): 10 mg via ORAL
  Filled 2014-06-06 (×3): qty 1

## 2014-06-06 MED ORDER — INSULIN PUMP
SUBCUTANEOUS | Status: DC
Start: 2014-06-06 — End: 2014-06-06
  Administered 2014-06-06: 06:00:00 via SUBCUTANEOUS
  Filled 2014-06-06: qty 1

## 2014-06-06 MED ORDER — PANTOPRAZOLE SODIUM 40 MG PO TBEC
40.0000 mg | DELAYED_RELEASE_TABLET | Freq: Every day | ORAL | Status: DC
  Filled 2014-06-06: qty 1

## 2014-06-06 MED ORDER — CLOBETASOL PROPIONATE 0.05 % EX CREA: TOPICAL_CREAM | Freq: Every day | CUTANEOUS | Status: DC | PRN

## 2014-06-06 MED ORDER — FUROSEMIDE 40 MG PO TABS
40.0000 mg | ORAL_TABLET | Freq: Every day | ORAL | Status: DC
  Administered 2014-06-06: 40 mg via ORAL
  Filled 2014-06-06 (×2): qty 1

## 2014-06-06 MED ORDER — FERROUS FUMARATE 325 (106 FE) MG PO TABS
1.0000 | ORAL_TABLET | Freq: Two times a day (BID) | ORAL | Status: DC
  Administered 2014-06-06 – 2014-06-08 (×4): 106 mg via ORAL
  Filled 2014-06-06 (×6): qty 1

## 2014-06-06 MED ORDER — HEPARIN BOLUS VIA INFUSION
3000.0000 [IU] | Freq: Once | INTRAVENOUS | Status: AC
  Administered 2014-06-06: 3000 [IU] via INTRAVENOUS
  Filled 2014-06-06: qty 3000

## 2014-06-06 NOTE — Progress Notes (Signed)
ANTICOAGULATION CONSULT NOTE -Follow up  Pharmacy Consult for Heparin Indication: chest pain/ACS  Allergies  Allergen Reactions  . Clindamycin/Lincomycin Anaphylaxis and Shortness Of Breath  . Humalog [Insulin Lispro] Itching  . Penicillins Anaphylaxis, Hives and Rash    Childhood allergy   . Gentamycin [Gentamicin] Hives, Itching and Rash    Reaction noted post loop recorder implant, where gentamycin was used for irrigation. Topical prep  . Tizanidine Other (See Comments)    Hallucinate  . Atorvastatin Other (See Comments)     increased LFT's  . Avandia [Rosiglitazone Maleate]     Congestive heart failure  . Cholestyramine     Elevated LFTs  . Codeine Nausea And Vomiting  . Crestor [Rosuvastatin] Other (See Comments)    Muscle and joint pain &  . Erythromycin Nausea And Vomiting  . Metformin     Congestive heart failure  . Novolog [Insulin Aspart] Itching    Patient reports she has severe itching with Novolog insulin  . Plavix [Clopidogrel Bisulfate]     P2Y12 testing = 271 while on Plavix  . Plavix [Clopidogrel]     Does not work for patient  . Pravastatin Other (See Comments)    In high doses (20-49m) causes muscle and joint pain   . Simvastatin     Increased LFT's  . Tamiflu [Oseltamivir] Nausea And Vomiting  . Cephalexin Swelling and Rash    Extremities swell   . Iodinated Diagnostic Agents Other (See Comments)    PAIN DURING LYMPHANGIOGRAM '86, NO ALLERGY TO IV CONTRAST  . Neosporin [Neomycin-Bacitracin Zn-Polymyx] Rash    Patient Measurements: Height: 5' 2"  (157.5 cm) Weight: 124 lb 8 oz (56.473 kg) IBW/kg (Calculated) : 50.1  Vital Signs: Temp: 97.6 F (36.4 C) (11/15 1958) Temp Source: Oral (11/15 1958) BP: 123/44 mmHg (11/15 1958) Pulse Rate: 81 (11/15 1958)  Labs:  Recent Labs  06/06/14 0133 06/06/14 0144 06/06/14 0800 06/06/14 1135 06/06/14 2050  HGB 12.2 13.3 11.6*  --   --   HCT 37.5 39.0 35.7*  --   --   PLT 141*  --  143*  --   --    LABPROT  --   --  14.3  --   --   INR  --   --  1.10  --   --   HEPARINUNFRC  --   --   --  0.17* 0.25*  CREATININE  --  1.20* 1.06  --   --     Estimated Creatinine Clearance: 44.1 mL/min (by C-G formula based on Cr of 1.06).   Assessment: 63y.o. female admitted 06/06/2014  with chest pain for heparin.    Coag/Heme: ACS, heparin level remains subtherapeutic but trending up. No bleeding noted. Plans for cath in a.m.  Goal of Therapy:  Heparin level 0.3-0.7 units/ml Monitor platelets by anticoagulation protocol: Yes   Plan:  Increase heparin to 900 units/hr Will check heparin level in 8 hours  Thank you for allowing pharmacy to be a part of this patients care team.  CSherlon Handing PharmD, BCPS Clinical pharmacist, pager [phone]/15/2015 9:57 PM

## 2014-06-06 NOTE — Progress Notes (Addendum)
Note by Dr. Everlean Alstrom reviewed and agree with findings and assessment and plan.  Patient has had progressive angina over the past month and now with severe CP.  Initial troponin is negative and EKG shows new inferior infarct and T wave inversions in the inferior leads. Will plan cardiac cath in am.  Cardiac catheterization was discussed with the patient fully including risks on myocardial infarction, death, stroke, bleeding, arrhythmia, dye allergy, renal insufficiency or bleeding.  All patient questions and concerns were discussed and the patient understands and is willing to proceed.  Will continue ASA/IV Heparin gtt/BB/Brilinta/statin/oral long acting nitrates.  Of note, patient only has 1 kidney so will need to use minimal contrast.  Patient requests Dr. Angelena Form how has done her other interventions.  Signed: Fransico Him, MD [facility] 06/06/2014

## 2014-06-06 NOTE — Plan of Care (Signed)
Problem: Phase II Progression Outcomes Goal: Hemodynamically stable Outcome: Completed/Met Date Met:  06/06/14 Goal: Anginal pain relieved Outcome: Completed/Met Date Met:  06/06/14 Goal: Cath/PCI Day Path if indicated Outcome: Completed/Met Date Met:  06/06/14 Scheduled for tomorrow 06/07/14

## 2014-06-06 NOTE — Progress Notes (Signed)
ANTICOAGULATION CONSULT NOTE -Follow up  Pharmacy Consult for Heparin Indication: chest pain/ACS  Allergies  Allergen Reactions  . Clindamycin/Lincomycin Anaphylaxis and Shortness Of Breath  . Humalog [Insulin Lispro] Itching  . Penicillins Anaphylaxis, Hives and Rash    Childhood allergy   . Gentamycin [Gentamicin] Hives, Itching and Rash    Reaction noted post loop recorder implant, where gentamycin was used for irrigation. Topical prep  . Tizanidine Other (See Comments)    Hallucinate  . Atorvastatin Other (See Comments)     increased LFT's  . Avandia [Rosiglitazone Maleate]     Congestive heart failure  . Cholestyramine     Elevated LFTs  . Codeine Nausea And Vomiting  . Crestor [Rosuvastatin] Other (See Comments)    Muscle and joint pain &  . Erythromycin Nausea And Vomiting  . Metformin     Congestive heart failure  . Novolog [Insulin Aspart] Itching    Patient reports she has severe itching with Novolog insulin  . Plavix [Clopidogrel Bisulfate]     P2Y12 testing = 271 while on Plavix  . Plavix [Clopidogrel]     Does not work for patient  . Pravastatin Other (See Comments)    In high doses (20-30m) causes muscle and joint pain   . Simvastatin     Increased LFT's  . Tamiflu [Oseltamivir] Nausea And Vomiting  . Cephalexin Swelling and Rash    Extremities swell   . Iodinated Diagnostic Agents Other (See Comments)    PAIN DURING LYMPHANGIOGRAM '86, NO ALLERGY TO IV CONTRAST  . Neosporin [Neomycin-Bacitracin Zn-Polymyx] Rash    Patient Measurements: Height: 5' 2"  (157.5 cm) Weight: 124 lb 8 oz (56.473 kg) IBW/kg (Calculated) : 50.1  Vital Signs: Temp: 98.4 F (36.9 C) (11/15 0515) Temp Source: Oral (11/15 0515) BP: 125/47 mmHg (11/15 0515) Pulse Rate: 78 (11/15 0515)  Labs:  Recent Labs  06/06/14 0133 06/06/14 0144 06/06/14 0800 06/06/14 1135  HGB 12.2 13.3 11.6*  --   HCT 37.5 39.0 35.7*  --   PLT 141*  --  143*  --   LABPROT  --   --  14.3  --    INR  --   --  1.10  --   HEPARINUNFRC  --   --   --  0.17*  CREATININE  --  1.20* 1.06  --     Estimated Creatinine Clearance: 44.1 mL/min (by C-G formula based on Cr of 1.06).   Medical History: Past Medical History  Diagnosis Date  . Aortic stenosis     a. bicuspid aortic valve; mean gradient of 20 mmHg in 2/10; b. s/p bioprosth AVR (19-mm Edwards pericardial valve) with a redo coronary artery bypass graft procedure in 07/2009; postoperative Dressler's syndrome    . Coronary artery disease     a. s/p RCA saphenous vein graft in 06/1999; b. multiple preceding PCIs with restenosis; c. total obstruction of RCA in 2006 with patent graft; d. 07/1999 Redo VG->RCA;  e. 03/2013 Cath/PCI: LM 80ost/d (2.75x24 Promus Premier DES), LAD 80ost, LCx nl, RCA 100, VG->PDA ok.  . Chronic diastolic CHF (congestive heart failure)     a. 9.2014 EchoP EF 60-65%, no rwma, bioprosth AVR, mean gradient of 110mg, mild to mod MR, PASP 5181m.  . HMarland Kitchenperlipidemia   . Hypertension   . Small bowel obstruction     Recurrent; resolved after resection of a lipoma  . Osteopenia      hip on DEXA in October 2007.  . AMarland Kitchenemia   .  Carcinoma, renal cell 05/2007    Laparoscopic right nephrectomy  . NASH (nonalcoholic steatohepatitis) 1999    -biopsy in 1999  . Diabetes mellitus 03/2010     TYPE II: Hemoglobin A1c of 7.4 in  03/2010; 8.4 in 06/2010; treated with insulin pump  . Migraines   . Duodenal ulcer     Remote; H. pylori positive  . Colitis, ischemic 2008  . Gastroesophageal reflux disease     ? of GASTROPARESIS; IRRITABLE BOWEL SYNDROME  . Hodgkin's disease 1991    Mantle radiation therapy  . Exertional dyspnea     chest discomfort  . Statin intolerance   . Hypercalcemia     Ca-11.1 in 07/2011  . Vitamin D deficiency   . Costochondritis   . Insomnia   . Iron deficiency anemia     a. 9.2014 EGD: essentially normal.  . Gastroparesis due to DM 05/01/2012  . PONV (postoperative nausea and vomiting)   .  Hyperplastic gastric polyp 06/23/2012  . Arthritis   . Osteoporosis   . Pacemaker 08/21/2012    Medtronic Adapta L dual-chamber pacemaker-Dr. Lovena Le  . Anxiety     Medications:  Prescriptions prior to admission  Medication Sig Dispense Refill Last Dose  . ALPRAZolam (XANAX) 0.5 MG tablet Take 0.25-0.5 mg by mouth 2 (two) times daily as needed for anxiety.    06/05/2014 at Unknown time  . aminocaproic acid (AMICAR) 25 % solution Take 10 ml and hold in mouth x 2 minutes then spit every 6 hours x 2 days prn gums bleeding post dental cleaning. 400 mL 0 6 months  . aspirin EC 81 MG tablet Take 81 mg by mouth at bedtime.   06/06/2014 at Unknown time  . azithromycin (ZITHROMAX) 500 MG tablet 1 tablet by mouth one hour prior to dental appointment/procedure 2 tablet 0 6 months  . BRILINTA 90 MG TABS tablet TAKE ONE TABLET TWICE DAILY 60 tablet 6 06/05/2014 at Unknown time  . Calcium Carbonate-Vitamin D (RA CALCIUM PLUS VITAMIN D) 600-400 MG-UNIT per tablet Take 1 tablet by mouth 2 (two) times daily.    06/05/2014 at Unknown time  . cholecalciferol (VITAMIN D) 1000 UNITS tablet Take 1,000 Units by mouth daily.    06/05/2014 at Unknown time  . clobetasol (TEMOVATE) 0.05 % external solution Apply 1 application topically daily as needed (dermatosis).   Past Month at Unknown time  . desoximetasone (TOPICORT) 0.05 % cream Apply 1 application topically 2 (two) times daily as needed. When skin starts peeling   last month  . docusate sodium (COLACE) 100 MG capsule Take 1 capsule (100 mg total) by mouth 2 (two) times daily. 60 capsule 3 06/05/2014 at Unknown time  . fenofibrate (TRICOR) 145 MG tablet TAKE ONE (1) TABLET EACH DAY 90 tablet 3 06/05/2014 at Unknown time  . ferrous fumarate (HEMOCYTE - 106 MG FE) 325 (106 FE) MG TABS tablet Take 1 tablet (106 mg of iron total) by mouth 2 (two) times daily. Or as directed. Take with Vitamin -C (Patient taking differently: Take 1 tablet by mouth 2 (two) times a week. Or  as directed. Take with Vitamin -C) 100 each 1 Past Week at Unknown time  . fluticasone (FLONASE) 50 MCG/ACT nasal spray Place 2 sprays into the nose daily as needed for rhinitis. For nasal congestion   last year  . furosemide (LASIX) 40 MG tablet TAKE TWO TABLETS ONCE DAILY 60 tablet 3 06/05/2014 at Unknown time  . glucagon (GLUCAGON EMERGENCY) 1 MG injection Inject  1 mg into the vein once as needed (for severe reaction).   never  . Insulin Human (INSULIN PUMP) 100 unit/ml SOLN Inject into the skin continuous. Apidra Basal Rate midnight-400 : 0.3 400-700: 0.25 700-830: 2.5 830- 1230: 3.0 1230-1900: 2.5 1900-2400: 3.2   06/06/2014 at Unknown time  . isosorbide mononitrate (IMDUR) 60 MG 24 hr tablet Take 90 mg by mouth daily. 2 daily   06/05/2014 at Unknown time  . Magnesium 250 MG TABS Take 250 mg by mouth daily.    06/05/2014 at Unknown time  . metoprolol (LOPRESSOR) 50 MG tablet Take 75 mg by mouth 2 (two) times daily.    06/05/2014 at 2230  . Multiple Vitamins-Minerals (MULTIVITAMIN WITH MINERALS) tablet Take 1 tablet by mouth daily.     06/05/2014 at Unknown time  . nitroGLYCERIN (NITROLINGUAL) 0.4 MG/SPRAY spray Place 1 spray under the tongue every 5 (five) minutes as needed for chest pain. 12 g 1 06/06/2014 at Unknown time  . omeprazole (PRILOSEC OTC) 20 MG tablet Take 20 mg by mouth daily.   06/05/2014 at Unknown time  . polyethylene glycol (MIRALAX / GLYCOLAX) packet Take 8.5 g by mouth daily as needed. For constipation   3-4 months  . potassium chloride SA (K-DUR,KLOR-CON) 20 MEQ tablet TAKE TWO (2) TABLETS BY MOUTH DAILY 60 tablet 5 06/05/2014 at Unknown time  . pravastatin (PRAVACHOL) 10 MG tablet TAKE ONE (1) TABLET BY MOUTH EVERY DAY 90 tablet 1 06/05/2014 at Unknown time  . vitamin C (ASCORBIC ACID) 500 MG tablet Take 250 mg by mouth daily.    06/05/2014 at Unknown time  . Wheat Dextrin (BENEFIBER PO) Take 45 mLs by mouth every morning. 3 tablespoons each morning   06/05/2014 at  Unknown time  . ZETIA 10 MG tablet TAKE ONE TABLET BY MOUTH EVERY NIGHT AT BEDTIME 90 tablet 3 06/05/2014 at Unknown time  . metoprolol (LOPRESSOR) 50 MG tablet TAKE ONE AND ONE-HALF TABLET TWO TIMES ADAY (Patient not taking: Reported on 06/06/2014) 606 tablet 6     Assessment: 63 y.o. female admitted 06/06/2014  with chest pain for heparin.    Coag/Heme: ACS, heparin level < goal, CBC trend slightly down  CV: NSTEMI, plan for cath 11/16, trig 279 ASA, zetia, tricor, furo, IMDUR, metop, K 20 daily (4.0,  Pravastatin, ticagrelor  Endo: DM, novolog allergy, regular SSI + lantus planed while in hospital,  Glucose 200-300 Renal: Single kidney, CrCl ~40-45 ml/min  Goal of Therapy:  Heparin level 0.3-0.7 units/ml Monitor platelets by anticoagulation protocol: Yes   Plan:  Heparin 1500 units IV bolus, then 800 units/hr Check heparin level in 8 hours.  Thank you for allowing pharmacy to be a part of this patients care team.  Rowe Robert Pharm.D., BCPS, AQ-Cardiology Clinical Pharmacist 06/06/2014 12:22 PM Pager: [phone] Phone: [phone]

## 2014-06-06 NOTE — ED Notes (Signed)
Pt to ED with c/o mid chest pain onset approx 12am today.  St's she took NTG SL without relief.  Denies nausea or vomiting.  St's had some shortness of breath

## 2014-06-06 NOTE — Progress Notes (Signed)
Pt blood glucose 227.  Pt currently receiving 40% of basal rate from her insulin pump.  Pt has petechiae on hands/wrists.  Dr. Oval Linsey paged and was in to see pt.  Will continue to monitor. Claudette Stapler, RN

## 2014-06-06 NOTE — H&P (Addendum)
  Patient ID: [name] [name] MRN: [id number], DOB/AGE: 61/29/1952   Admit date: 06/06/2014   Primary Physician: MCGOUGH,WILLIAM M, MD Primary Cardiologist: [name] McLean, MD  Pt. Profile:  63F with CAD s/p CABG in 12/05 (SVG to RCA due to recurrent RCA ISR) and redo in 1/11 (SVG to RCA) with PCI of a LM into LAD lesion (9/14, DES from LM to pLAD, was not CABG candidate d/t 2 prior sternotomies and h/o chest radiation), bioprosthetic AVR (1/11), diastolic CHF, single kidney s/p right nephrectomy, slow GI bleed, pAF (off anticoagulation due to need for DAPT and prior GIB) and sick sinus syndrome s/p PPM who presents with severe CP in the setting of progressive angina over the past month.    Problem List  Past Medical History  Diagnosis Date  . Aortic stenosis     a. bicuspid aortic valve; mean gradient of 20 mmHg in 2/10; b. s/p bioprosth AVR (19-mm Edwards pericardial valve) with a redo coronary artery bypass graft procedure in 07/2009; postoperative Dressler's syndrome    . Coronary artery disease     a. s/p RCA saphenous vein graft in 06/1999; b. multiple preceding PCIs with restenosis; c. total obstruction of RCA in 2006 with patent graft; d. 07/1999 Redo VG->RCA;  e. 03/2013 Cath/PCI: LM 80ost/d (2.75x24 Promus Premier DES), LAD 80ost, LCx nl, RCA 100, VG->PDA ok.  . Chronic diastolic CHF (congestive heart failure)     a. 9.2014 EchoP EF 60-65%, no rwma, bioprosth AVR, mean gradient of 15mmHg, mild to mod MR, PASP 51mmHg.  . Hyperlipidemia   . Hypertension   . Small bowel obstruction     Recurrent; resolved after resection of a lipoma  . Osteopenia      hip on DEXA in October 2007.  . Anemia   . Carcinoma, renal cell 05/2007    Laparoscopic right nephrectomy  . NASH (nonalcoholic steatohepatitis) 1999    -biopsy in 1999  . Diabetes mellitus 03/2010     TYPE II: Hemoglobin A1c of 7.4 in  03/2010; 8.4 in 06/2010; treated with insulin pump  . Migraines   . Duodenal ulcer    Remote; H. pylori positive  . Colitis, ischemic 2008  . Gastroesophageal reflux disease     ? of GASTROPARESIS; IRRITABLE BOWEL SYNDROME  . Hodgkin's disease 1991    Mantle radiation therapy  . Exertional dyspnea     chest discomfort  . Statin intolerance   . Hypercalcemia     Ca-11.1 in 07/2011  . Vitamin D deficiency   . Costochondritis   . Insomnia   . Iron deficiency anemia     a. 9.2014 EGD: essentially normal.  . Gastroparesis due to DM 05/01/2012  . PONV (postoperative nausea and vomiting)   . Hyperplastic gastric polyp 06/23/2012  . Arthritis   . Osteoporosis   . Pacemaker 08/21/2012    Medtronic Adapta L dual-chamber pacemaker-Dr. Taylor  . Anxiety     Past Surgical History  Procedure Laterality Date  . Total abdominal hysterectomy w/ bilateral salpingoophorectomy  2000  . Breast excisional biopsy      benign, bilateral  . Nephrectomy  2008    Laparoscopic right nephrectomy, renal cell carcinoma  . Coronary artery bypass graft  2005, 2011    CABG-most recent SVG to RCA-12/05; RCA occluded with patent graft in 2006; Redo bypass in 07/2009  . Tumor excision  1981    removal of Hodgkins lymphoma  . Exploratory laparotomy w/ bowel resection        Small bowel resection of lipoma  . Aortic valve replacement  2011    Aortic valve replacement surgery with a 19 mm bioprosthetic device post redo CABG January 2011.  . Colonoscopy      Multiple with adenomatous polyps  . Cervical biopsy    . Liver biopsy    . Bone marrow biopsy    . Esophagogastroduodenoscopy    . Esophagogastroduodenoscopy  06/23/2012    Procedure: ESOPHAGOGASTRODUODENOSCOPY (EGD);  Surgeon: Carl E Gessner, MD;  Location: [facility];  Service: Endoscopy;  Laterality: N/A;  . Colonoscopy  06/23/2012    Procedure: COLONOSCOPY;  Surgeon: Carl E Gessner, MD;  Location: [facility];  Service: Endoscopy;  Laterality: N/A;  . Maloney dilation  06/23/2012    Procedure: MALONEY DILATION;  Surgeon: Carl E Gessner,  MD;  Location: [facility];  Service: Endoscopy;;  54 fr  . Pacemaker insertion  08/21/2012    Medtronic Adapta L dual-chamber pacemaker  . Esophagogastroduodenoscopy N/A 04/06/2013    Procedure: ESOPHAGOGASTRODUODENOSCOPY (EGD);  Surgeon: John N Perry, MD;  Location: [facility];  Service: Endoscopy;  Laterality: N/A;  . Givens capsule study N/A 04/30/2013    Procedure: GIVENS CAPSULE STUDY;  Surgeon: Carl E Gessner, MD;  Location: [facility];  Service: Endoscopy;  Laterality: N/A;     Allergies  Allergies  Allergen Reactions  . Clindamycin/Lincomycin Anaphylaxis and Shortness Of Breath  . Humalog [Insulin Lispro] Itching  . Penicillins Anaphylaxis, Hives and Rash    Childhood allergy   . Gentamycin [Gentamicin] Hives, Itching and Rash    Reaction noted post loop recorder implant, where gentamycin was used for irrigation. Topical prep  . Tizanidine Other (See Comments)    Hallucinate  . Atorvastatin Other (See Comments)     increased LFT's  . Avandia [Rosiglitazone Maleate]     Congestive heart failure  . Cholestyramine     Elevated LFTs  . Codeine Nausea And Vomiting  . Crestor [Rosuvastatin] Other (See Comments)    Muscle and joint pain &  . Erythromycin Nausea And Vomiting  . Metformin     Congestive heart failure  . Novolog [Insulin Aspart] Itching    Patient reports she has severe itching with Novolog insulin  . Plavix [Clopidogrel Bisulfate]     P2Y12 testing = 271 while on Plavix  . Plavix [Clopidogrel]     Does not work for patient  . Pravastatin Other (See Comments)    In high doses (20-40mg) causes muscle and joint pain   . Simvastatin     Increased LFT's  . Tamiflu [Oseltamivir] Nausea And Vomiting  . Cephalexin Swelling and Rash    Extremities swell   . Iodinated Diagnostic Agents Other (See Comments)    PAIN DURING LYMPHANGIOGRAM '86, NO ALLERGY TO IV CONTRAST  . Neosporin [Neomycin-Bacitracin Zn-Polymyx] Rash    HPI  63F with CAD s/p CABG in 12/05  (SVG to RCA due to recurrent RCA ISR) and redo in 1/11 (SVG to RCA) with PCI of a LM into LAD lesion (9/14, DES from LM to pLAD, was not CABG candidate d/t 2 prior sternotomies and h/o chest radiation), bioprosthetic AVR (1/11), diastolic CHF, single kidney s/p right nephrectomy, slow GI bleed, pAF (off anticoagulation due to need for DAPT and prior GIB) and sick sinus syndrome s/p PPM who presents with severe CP in the setting of progressive angina over the past month.   Ms. Schlee reports that she historically had an episode of angina every 1-2 months. Over the past month   or so, she reported an increase in frequency to about 5-7x per week, occasionally with 2 episodes per day. Symptoms often were post-prandial.   Imdur was increased to 120mg in attempts to control angina but this was not tolerated due to hypotension. Her angina has been responsive to nitro spray.   She reports that yesterday she at dinner and returned home around 10pm. She had a recurrence of her angina ant took nitro with relief and went to bed. She was awoken at night at 12am with sever chest pain, 9/10 in severity, felt "hard...in the center...sharp."  She took a total of 3 nitros without relief. She took 4 baby ASA and she and her husband got in the car to drive to [facility]. En route she took additional nitros with improvement in her pain severity to 4/10. She reports that this episode was the worst episode she has had. It was more severe than usual and - in contrast to previous episodes - did not initially respond to nitro. She denies recent missed doses of DAPT regimen.   In the ED, she was hemodynamically stable. Labs were notable for K 3.8, Cr 1.20, POC TnI 0.01, plt 141, hct 37.5 (stable). CXR was unremarkable. ECG demonstrated NSR, 1st degree AVB (304ms), inferolateral Q waves. QRS notching in I and aVL.   Home Medications  Prior to Admission medications   Medication Sig Start Date End Date Taking? Authorizing Provider    ALPRAZolam (XANAX) 0.5 MG tablet Take 0.25-0.5 mg by mouth 2 (two) times daily as needed for anxiety.    Yes Historical Provider, MD  aminocaproic acid (AMICAR) 25 % solution Take 10 ml and hold in mouth x 2 minutes then spit every 6 hours x 2 days prn gums bleeding post dental cleaning. 07/15/13  Yes Lennis P Livesay, MD  aspirin EC 81 MG tablet Take 81 mg by mouth at bedtime.   Yes Historical Provider, MD  azithromycin (ZITHROMAX) 500 MG tablet 1 tablet by mouth one hour prior to dental appointment/procedure 12/21/13  Yes [name] S McLean, MD  BRILINTA 90 MG TABS tablet TAKE ONE TABLET TWICE DAILY   Yes [name] S McLean, MD  Calcium Carbonate-Vitamin D (RA CALCIUM PLUS VITAMIN D) 600-400 MG-UNIT per tablet Take 1 tablet by mouth 2 (two) times daily.    Yes Historical Provider, MD  cholecalciferol (VITAMIN D) 1000 UNITS tablet Take 1,000 Units by mouth daily.    Yes Historical Provider, MD  clobetasol (TEMOVATE) 0.05 % external solution Apply 1 application topically daily as needed (dermatosis).   Yes Historical Provider, MD  desoximetasone (TOPICORT) 0.05 % cream Apply 1 application topically 2 (two) times daily as needed. When skin starts peeling   Yes Historical Provider, MD  docusate sodium (COLACE) 100 MG capsule Take 1 capsule (100 mg total) by mouth 2 (two) times daily. 04/11/13  Yes Christopher R Berge, NP  fenofibrate (TRICOR) 145 MG tablet TAKE ONE (1) TABLET EACH DAY   Yes [name] S McLean, MD  ferrous fumarate (HEMOCYTE - 106 MG FE) 325 (106 FE) MG TABS tablet Take 1 tablet (106 mg of iron total) by mouth 2 (two) times daily. Or as directed. Take with Vitamin -C Patient taking differently: Take 1 tablet by mouth 2 (two) times a week. Or as directed. Take with Vitamin -C 05/04/14  Yes Lennis P Livesay, MD  fluticasone (FLONASE) 50 MCG/ACT nasal spray Place 2 sprays into the nose daily as needed for rhinitis. For nasal congestion   Yes Historical Provider,   MD  furosemide (LASIX) 40 MG tablet  TAKE TWO TABLETS ONCE DAILY 03/09/14  Yes [name] S McLean, MD  glucagon (GLUCAGON EMERGENCY) 1 MG injection Inject 1 mg into the vein once as needed (for severe reaction).   Yes Historical Provider, MD  Insulin Human (INSULIN PUMP) 100 unit/ml SOLN Inject into the skin continuous. Apidra Basal Rate midnight-400 : 0.3 400-700: 0.25 700-830: 2.5 830- 1230: 3.0 1230-1900: 2.5 1900-2400: 3.2   Yes Historical Provider, MD  isosorbide mononitrate (IMDUR) 60 MG 24 hr tablet Take 90 mg by mouth daily. 2 daily 04/30/14  Yes Lori C Gerhardt, NP  Magnesium 250 MG TABS Take 250 mg by mouth daily.    Yes Historical Provider, MD  metoprolol (LOPRESSOR) 50 MG tablet Take 75 mg by mouth 2 (two) times daily.  10/01/13  Yes Michele M Lenze, PA-C  Multiple Vitamins-Minerals (MULTIVITAMIN WITH MINERALS) tablet Take 1 tablet by mouth daily.     Yes Historical Provider, MD  nitroGLYCERIN (NITROLINGUAL) 0.4 MG/SPRAY spray Place 1 spray under the tongue every 5 (five) minutes as needed for chest pain. 05/06/14  Yes [name] S McLean, MD  omeprazole (PRILOSEC OTC) 20 MG tablet Take 20 mg by mouth daily.   Yes Historical Provider, MD  polyethylene glycol (MIRALAX / GLYCOLAX) packet Take 8.5 g by mouth daily as needed. For constipation   Yes Historical Provider, MD  potassium chloride SA (K-DUR,KLOR-CON) 20 MEQ tablet TAKE TWO (2) TABLETS BY MOUTH DAILY   Yes Gregg W Taylor, MD  pravastatin (PRAVACHOL) 10 MG tablet TAKE ONE (1) TABLET BY MOUTH EVERY DAY 03/24/14  Yes [name] S McLean, MD  vitamin C (ASCORBIC ACID) 500 MG tablet Take 250 mg by mouth daily.    Yes Historical Provider, MD  Wheat Dextrin (BENEFIBER PO) Take 45 mLs by mouth every morning. 3 tablespoons each morning   Yes Historical Provider, MD  ZETIA 10 MG tablet TAKE ONE TABLET BY MOUTH EVERY NIGHT AT BEDTIME 06/05/13  Yes [name] S McLean, MD  metoprolol (LOPRESSOR) 50 MG tablet TAKE ONE AND ONE-HALF TABLET TWO TIMES ADAY Patient not taking: Reported on 06/06/2014  05/28/14   [name] S McLean, MD    Family History  Family History  Problem Relation Age of Onset  . Heart attack Mother   . Heart disease Mother   . Heart attack Father   . Diabetes Father   . Heart disease Father   . Hypertension Brother   . Glaucoma Brother   . Lung cancer Sister     meta  . Breast cancer Sister     half sister (Paternal)  . Ovarian cancer Sister   . Cervical cancer Sister     half sister (maternal)  . Breast cancer Sister   . Colon cancer Neg Hx   . Stomach cancer Neg Hx   . Lung cancer Sister     half sister (maternal)    Social History  History   Social History  . Marital Status: Married    Spouse Name: N/A    Number of Children: 2  . Years of Education: N/A   Occupational History  . full time     elementary principal   Social History Main Topics  . Smoking status: Never Smoker   . Smokeless tobacco: Never Used  . Alcohol Use: No  . Drug Use: No  . Sexual Activity: Yes   Other Topics Concern  . Not on file   Social History Narrative   School principal, married     Started disability September 2013     Review of Systems General:  No chills, fever, night sweats or weight changes.  Cardiovascular:  See HPI Dermatological: No rash, lesions/masses Respiratory: No cough, dyspnea Urologic: No hematuria, dysuria Abdominal:   No nausea, vomiting, diarrhea, bright red blood per rectum, melena, or hematemesis.  Neurologic:  No visual changes, wkns, changes in mental status. All other systems reviewed and are otherwise negative except as noted above.  Physical Exam  Blood pressure 114/46, pulse 75, temperature 97.7 F (36.5 C), temperature source Oral, resp. rate 19, height 5' 2" (1.575 m), weight 55.339 kg (122 lb), SpO2 97 %.  General: Pleasant, NAD Psych: Normal affect. Neuro: Alert and oriented X 3. Moves all extremities spontaneously. HEENT: Normal  Neck: Supple without bruits or JVD. Lungs:  Resp regular and unlabored,  CTA. Heart: RRR no s3, s4, soft systolic murmur at USB.  Abdomen: Soft, non-tender, non-distended, BS + x 4.  Extremities: No clubbing, cyanosis or edema. DP/PT/Radials 2+ and equal bilaterally.  Labs  Troponin (Point of Care Test)  Recent Labs  06/06/14 0147  TROPIPOC 0.01   No results for input(s): CKTOTAL, CKMB, TROPONINI in the last 72 hours. Lab Results  Component Value Date   WBC 8.9 06/06/2014   HGB 13.3 06/06/2014   HCT 39.0 06/06/2014   MCV 91.9 06/06/2014   PLT 141* 06/06/2014    Recent Labs Lab 06/06/14 0144  NA 141  K 3.8  CL 105  BUN 31*  CREATININE 1.20*  GLUCOSE 289*   Lab Results  Component Value Date   CHOL 151 04/04/2013   HDL 29* 04/04/2013   LDLCALC 99 04/04/2013   TRIG 116 04/04/2013   Lab Results  Component Value Date   DDIMER 0.80* 10/21/2012     Radiology/Studies  Dg Chest 2 View  06/06/2014   CLINICAL DATA:  Chest pain for 2 weeks, worsened tonight, no relief with nitro spray  EXAM: CHEST  2 VIEW  COMPARISON:  02/04/2013  FINDINGS: There is no focal parenchymal opacity, pleural effusion, or pneumothorax. The heart and mediastinum are stable. There is evidence of prior aortic valve replacement. There is a dual lead pacemaker.  The osseous structures are unremarkable.  IMPRESSION: No active cardiopulmonary disease.   Electronically Signed   By: Hetal  Patel   On: 06/06/2014 02:25    ECG  06/06/14: NSR, 1st degree AVB (304ms), inferolateral Q waves. QRS notching in I and aVL. Compared to prior on 02/22/14, axis has shifted (-14-->52), and inferolateral Q waves and QRS notching are new.   ASSESSMENT AND PLAN  63F with CAD s/p CABG in 12/05 (SVG to RCA due to recurrent RCA ISR) and redo in 1/11 (SVG to RCA) with PCI of a LM into LAD lesion (9/14, DES from LM to pLAD, was not CABG candidate d/t 2 prior sternotomies and h/o chest radiation), bioprosthetic AVR (1/11), diastolic CHF, single kidney s/p right nephrectomy, slow GI bleed, pAF (off  anticoagulation due to need for DAPT and prior GIB) and sick sinus syndrome s/p PPM who presents with severe CP in the setting of progressive angina over the past month.   This presentation is very concerning for UA, particularly in light of ECG findings suggestive that she may have had an infarction over the past few months. She will be admitted to the hospital and treated for ACS with tentative plan for cardiac cath.   1. Cycle troponins 2. Continue metoprolol, pravastatin, ticagrelor, aspirin. Start UFH drip per pharmacy.    3. DM - tx via insulin pump and continuous glucose monitoring. Consider low dose ACE for renal protection after cardiac cath.  4. Tentative plan for cath on Monday  Signed, FRIEDMAN, DANIEL, MD 06/06/2014, 3:31 AM  

## 2014-06-06 NOTE — Progress Notes (Signed)
ANTICOAGULATION CONSULT NOTE - Initial Consult  Pharmacy Consult for Heparin Indication: chest pain/ACS  Allergies  Allergen Reactions  . Clindamycin/Lincomycin Anaphylaxis and Shortness Of Breath  . Humalog [Insulin Lispro] Itching  . Penicillins Anaphylaxis, Hives and Rash    Childhood allergy   . Gentamycin [Gentamicin] Hives, Itching and Rash    Reaction noted post loop recorder implant, where gentamycin was used for irrigation. Topical prep  . Tizanidine Other (See Comments)    Hallucinate  . Atorvastatin Other (See Comments)     increased LFT's  . Avandia [Rosiglitazone Maleate]     Congestive heart failure  . Cholestyramine     Elevated LFTs  . Codeine Nausea And Vomiting  . Crestor [Rosuvastatin] Other (See Comments)    Muscle and joint pain &  . Erythromycin Nausea And Vomiting  . Metformin     Congestive heart failure  . Novolog [Insulin Aspart] Itching    Patient reports she has severe itching with Novolog insulin  . Plavix [Clopidogrel Bisulfate]     P2Y12 testing = 271 while on Plavix  . Plavix [Clopidogrel]     Does not work for patient  . Pravastatin Other (See Comments)    In high doses (20-87m) causes muscle and joint pain   . Simvastatin     Increased LFT's  . Tamiflu [Oseltamivir] Nausea And Vomiting  . Cephalexin Swelling and Rash    Extremities swell   . Iodinated Diagnostic Agents Other (See Comments)    PAIN DURING LYMPHANGIOGRAM '86, NO ALLERGY TO IV CONTRAST  . Neosporin [Neomycin-Bacitracin Zn-Polymyx] Rash    Patient Measurements: Height: 5' 2"  (157.5 cm) Weight: 122 lb (55.339 kg) IBW/kg (Calculated) : 50.1  Vital Signs: Temp: 97.7 F (36.5 C) (11/15 0109) Temp Source: Oral (11/15 0109) BP: 159/55 mmHg (11/15 0400) Pulse Rate: 79 (11/15 0400)  Labs:  Recent Labs  06/06/14 0133 06/06/14 0144  HGB 12.2 13.3  HCT 37.5 39.0  PLT 141*  --   CREATININE  --  1.20*    Estimated Creatinine Clearance: 38.9 mL/min (by C-G formula  based on Cr of 1.2).   Medical History: Past Medical History  Diagnosis Date  . Aortic stenosis     a. bicuspid aortic valve; mean gradient of 20 mmHg in 2/10; b. s/p bioprosth AVR (19-mm Edwards pericardial valve) with a redo coronary artery bypass graft procedure in 07/2009; postoperative Dressler's syndrome    . Coronary artery disease     a. s/p RCA saphenous vein graft in 06/1999; b. multiple preceding PCIs with restenosis; c. total obstruction of RCA in 2006 with patent graft; d. 07/1999 Redo VG->RCA;  e. 03/2013 Cath/PCI: LM 80ost/d (2.75x24 Promus Premier DES), LAD 80ost, LCx nl, RCA 100, VG->PDA ok.  . Chronic diastolic CHF (congestive heart failure)     a. 9.2014 EchoP EF 60-65%, no rwma, bioprosth AVR, mean gradient of 173mg, mild to mod MR, PASP 5170m.  . HMarland Kitchenperlipidemia   . Hypertension   . Small bowel obstruction     Recurrent; resolved after resection of a lipoma  . Osteopenia      hip on DEXA in October 2007.  . AMarland Kitchenemia   . Carcinoma, renal cell 05/2007    Laparoscopic right nephrectomy  . NASH (nonalcoholic steatohepatitis) 1999    -biopsy in 1999  . Diabetes mellitus 03/2010     TYPE II: Hemoglobin A1c of 7.4 in  03/2010; 8.4 in 06/2010; treated with insulin pump  . Migraines   . Duodenal ulcer  Remote; H. pylori positive  . Colitis, ischemic 2008  . Gastroesophageal reflux disease     ? of GASTROPARESIS; IRRITABLE BOWEL SYNDROME  . Hodgkin's disease 1991    Mantle radiation therapy  . Exertional dyspnea     chest discomfort  . Statin intolerance   . Hypercalcemia     Ca-11.1 in 07/2011  . Vitamin D deficiency   . Costochondritis   . Insomnia   . Iron deficiency anemia     a. 9.2014 EGD: essentially normal.  . Gastroparesis due to DM 05/01/2012  . PONV (postoperative nausea and vomiting)   . Hyperplastic gastric polyp 06/23/2012  . Arthritis   . Osteoporosis   . Pacemaker 08/21/2012    Medtronic Adapta L dual-chamber pacemaker-Dr. Lovena Le  . Anxiety      Medications:  Xanax  ASA  Brilinta  Oscal  Colace  Tricor  Iron  Lasix  NPH  Imdur  Magnesium  Prilosec  KCl  Pravachol  Vit C  Zetia  Lopressor  Assessment: 63 y.o. female with chest pain for heparin   Goal of Therapy:  Heparin level 0.3-0.7 units/ml Monitor platelets by anticoagulation protocol: Yes   Plan:  Heparin 3000 units IV bolus, then 650 units/hr Check heparin level in 8 hours.  Caryl Pina 06/06/2014,4:07 AM

## 2014-06-06 NOTE — Progress Notes (Signed)
Cardiology Event Note  I was called to evaluate [name] [name] due to the development of a new petichial rash.  It started this evening on bilateral hands and is progressing up just proximal to her wrists.  The rash is non-blanching and non-pruritic.  She denies any recent trauma or injury.  Her only new medication is heparin.  Platelet count from one hour ago was 142, stable from 143 this am.  She denies any overt bleeding, melena or calf pain.  [name] also denies shortness of breath.  She had a similar petichial rash when she was admitted for a prior valve surgery.  This resolved without intervention and the source was not identified.  It is unclear what is causing the rash.  Platelet count is stable and wnl, so it is unlikely thrombocytopenia or HIT is the cause.  No trauma. She has many allergies, so it is likely an allergic source.  Patient was reassured and provided symptoms that would warrant further concern.  Will watch for now.

## 2014-06-06 NOTE — Consult Note (Addendum)
[facility] History and Physical  PEGEEN STIGER HQI:[id number] DOB: 05-03-1951 DOA: 06/06/2014  Referring physician:   PCP: Leonides Grills, MD   Reason for consultation: insulin-dependent diabetes on insulin pump, management of diabetes   HPI:  63 year old female with a history of coronary artery disease,history of diabetes who has been on an insulin pumpwith apidra , has been on insulin pump for the last 14 years. She is anticipated to have a cardiac cath tomorrow. Consultation was requested for adjusting her insulin regimen in anticipation of her procedure tomorrow. Patient is allergic to Humalog and NovoLog. Patient's pump setting are to basal rate determined by her endocrinologist in [place] . Patient states that she has had several outpatient procedures done for which she has to stay nothing by mouth and manage his own insulin pump. She typically would  cut her basal rate to  50%, while she is npo . Accu-Cheks need to be done hourly pre-and during the procedure for as long as the patient is nothing by mouth.     Review of Systems: negative for the following  Constitutional: Denies fever, chills, diaphoresis, appetite change and fatigue.  HEENT: Denies photophobia, eye pain, redness, hearing loss, ear pain, congestion, sore throat, rhinorrhea, sneezing, mouth sores, trouble swallowing, neck pain, neck stiffness and tinnitus.  Respiratory: Denies SOB, DOE, cough, chest tightness, and wheezing.  Cardiovascular: Denies chest pain, palpitations and leg swelling.  Gastrointestinal: Denies nausea, vomiting, abdominal pain, diarrhea, constipation, blood in stool and abdominal distention.  Genitourinary: Denies dysuria, urgency, frequency, hematuria, flank pain and difficulty urinating.  Musculoskeletal: Denies myalgias, back pain, joint swelling, arthralgias and gait problem.  Skin: Denies pallor, rash and wound.  Neurological: Denies dizziness, seizures, syncope, weakness,  light-headedness, numbness and headaches.  Hematological: Denies adenopathy. Easy bruising, personal or family bleeding history  Psychiatric/Behavioral: Denies suicidal ideation, mood changes, confusion, nervousness, sleep disturbance and agitation       Past Medical History  Diagnosis Date  . Aortic stenosis     a. bicuspid aortic valve; mean gradient of 20 mmHg in 2/10; b. s/p bioprosth AVR (19-mm Edwards pericardial valve) with a redo coronary artery bypass graft procedure in 07/2009; postoperative Dressler's syndrome    . Coronary artery disease     a. s/p RCA saphenous vein graft in 06/1999; b. multiple preceding PCIs with restenosis; c. total obstruction of RCA in 2006 with patent graft; d. 07/1999 Redo VG->RCA;  e. 03/2013 Cath/PCI: LM 80ost/d (2.75x24 Promus Premier DES), LAD 80ost, LCx nl, RCA 100, VG->PDA ok.  . Chronic diastolic CHF (congestive heart failure)     a. 9.2014 EchoP EF 60-65%, no rwma, bioprosth AVR, mean gradient of 53mHg, mild to mod MR, PASP 524mg.  . Marland Kitchenyperlipidemia   . Hypertension   . Small bowel obstruction     Recurrent; resolved after resection of a lipoma  . Osteopenia      hip on DEXA in October 2007.  . Marland Kitchennemia   . Carcinoma, renal cell 05/2007    Laparoscopic right nephrectomy  . NASH (nonalcoholic steatohepatitis) 1999    -biopsy in 1999  . Diabetes mellitus 03/2010     TYPE II: Hemoglobin A1c of 7.4 in  03/2010; 8.4 in 06/2010; treated with insulin pump  . Migraines   . Duodenal ulcer     Remote; H. pylori positive  . Colitis, ischemic 2008  . Gastroesophageal reflux disease     ? of GASTROPARESIS; IRRITABLE BOWEL SYNDROME  . Hodgkin's disease 1991    Mantle radiation  therapy  . Exertional dyspnea     chest discomfort  . Statin intolerance   . Hypercalcemia     Ca-11.1 in 07/2011  . Vitamin D deficiency   . Costochondritis   . Insomnia   . Iron deficiency anemia     a. 9.2014 EGD: essentially normal.  . Gastroparesis due to DM 05/01/2012   . PONV (postoperative nausea and vomiting)   . Hyperplastic gastric polyp 06/23/2012  . Arthritis   . Osteoporosis   . Pacemaker 08/21/2012    Medtronic Adapta L dual-chamber pacemaker-Dr. Lovena Le  . Anxiety      Past Surgical History  Procedure Laterality Date  . Total abdominal hysterectomy w/ bilateral salpingoophorectomy  2000  . Breast excisional biopsy      benign, bilateral  . Nephrectomy  2008    Laparoscopic right nephrectomy, renal cell carcinoma  . Coronary artery bypass graft  2005, 2011    CABG-most recent SVG to RCA-12/05; RCA occluded with patent graft in 2006; Redo bypass in 07/2009  . Tumor excision  1981    removal of Hodgkins lymphoma  . Exploratory laparotomy w/ bowel resection      Small bowel resection of lipoma  . Aortic valve replacement  2011    Aortic valve replacement surgery with a 19 mm bioprosthetic device post redo CABG January 2011.  . Colonoscopy      Multiple with adenomatous polyps  . Cervical biopsy    . Liver biopsy    . Bone marrow biopsy    . Esophagogastroduodenoscopy    . Esophagogastroduodenoscopy  06/23/2012    Procedure: ESOPHAGOGASTRODUODENOSCOPY (EGD);  Surgeon: Gatha Mayer, MD;  Location: [facility];  Service: Endoscopy;  Laterality: N/A;  . Colonoscopy  06/23/2012    Procedure: COLONOSCOPY;  Surgeon: Gatha Mayer, MD;  Location: [facility];  Service: Endoscopy;  Laterality: N/A;  [name] dilation  06/23/2012    Procedure: [name] DILATION;  Surgeon: Gatha Mayer, MD;  Location: [facility];  Service: Endoscopy;;  54 fr  . Pacemaker insertion  08/21/2012    Medtronic Adapta L dual-chamber pacemaker  . Esophagogastroduodenoscopy N/A 04/06/2013    Procedure: ESOPHAGOGASTRODUODENOSCOPY (EGD);  Surgeon: Irene Shipper, MD;  Location: [facility];  Service: Endoscopy;  Laterality: N/A;  . Givens capsule study N/A 04/30/2013    Procedure: GIVENS CAPSULE STUDY;  Surgeon: Gatha Mayer, MD;  Location: [facility];  Service:  Endoscopy;  Laterality: N/A;      Social History:  reports that she has never smoked. She has never used smokeless tobacco. She reports that she does not drink alcohol or use illicit drugs.    Allergies  Allergen Reactions  . Clindamycin/Lincomycin Anaphylaxis and Shortness Of Breath  . Humalog [Insulin Lispro] Itching  . Penicillins Anaphylaxis, Hives and Rash    Childhood allergy   . Gentamycin [Gentamicin] Hives, Itching and Rash    Reaction noted post loop recorder implant, where gentamycin was used for irrigation. Topical prep  . Tizanidine Other (See Comments)    Hallucinate  . Atorvastatin Other (See Comments)     increased LFT's  . Avandia [Rosiglitazone Maleate]     Congestive heart failure  . Cholestyramine     Elevated LFTs  . Codeine Nausea And Vomiting  . Crestor [Rosuvastatin] Other (See Comments)    Muscle and joint pain &  . Erythromycin Nausea And Vomiting  . Metformin     Congestive heart failure  . Novolog [Insulin Aspart] Itching  Patient reports she has severe itching with Novolog insulin  . Plavix [Clopidogrel Bisulfate]     P2Y12 testing = 271 while on Plavix  . Plavix [Clopidogrel]     Does not work for patient  . Pravastatin Other (See Comments)    In high doses (20-84m) causes muscle and joint pain   . Simvastatin     Increased LFT's  . Tamiflu [Oseltamivir] Nausea And Vomiting  . Cephalexin Swelling and Rash    Extremities swell   . Iodinated Diagnostic Agents Other (See Comments)    PAIN DURING LYMPHANGIOGRAM '86, NO ALLERGY TO IV CONTRAST  . Neosporin [Neomycin-Bacitracin Zn-Polymyx] Rash    Family History  Problem Relation Age of Onset  . Heart attack Mother   . Heart disease Mother   . Heart attack Father   . Diabetes Father   . Heart disease Father   . Hypertension Brother   . Glaucoma Brother   . Lung cancer Sister     meta  . Breast cancer Sister     half sister (Paternal)  . Ovarian cancer Sister   . Cervical cancer  Sister     half sister (maternal)  . Breast cancer Sister   . Colon cancer Neg Hx   . Stomach cancer Neg Hx   . Lung cancer Sister     half sister (maternal)     Prior to Admission medications   Medication Sig Start Date End Date Taking? Authorizing Provider  ALPRAZolam (Duanne Moron 0.5 MG tablet Take 0.25-0.5 mg by mouth 2 (two) times daily as needed for anxiety.    Yes Historical Provider, MD  aminocaproic acid (AMICAR) 25 % solution Take 10 ml and hold in mouth x 2 minutes then spit every 6 hours x 2 days prn gums bleeding post dental cleaning. 07/15/13  Yes Lennis PMarion Downer MD  aspirin EC 81 MG tablet Take 81 mg by mouth at bedtime.   Yes Historical Provider, MD  azithromycin (ZITHROMAX) 500 MG tablet 1 tablet by mouth one hour prior to dental appointment/procedure 12/21/13  Yes DLarey Dresser MD  BRILINTA 90 MG TABS tablet TAKE ONE TABLET TWICE DAILY   Yes DLarey Dresser MD  Calcium Carbonate-Vitamin D (RA CALCIUM PLUS VITAMIN D) 600-400 MG-UNIT per tablet Take 1 tablet by mouth 2 (two) times daily.    Yes Historical Provider, MD  cholecalciferol (VITAMIN D) 1000 UNITS tablet Take 1,000 Units by mouth daily.    Yes Historical Provider, MD  clobetasol (TEMOVATE) 0.05 % external solution Apply 1 application topically daily as needed (dermatosis).   Yes Historical Provider, MD  desoximetasone (TOPICORT) 0.05 % cream Apply 1 application topically 2 (two) times daily as needed. When skin starts peeling   Yes Historical Provider, MD  docusate sodium (COLACE) 100 MG capsule Take 1 capsule (100 mg total) by mouth 2 (two) times daily. 04/11/13  Yes CRogelia Mire NP  fenofibrate (TRICOR) 145 MG tablet TAKE ONE (1) TABLET EACH DAY   Yes DLarey Dresser MD  ferrous fumarate (HEMOCYTE - 106 MG FE) 325 (106 FE) MG TABS tablet Take 1 tablet (106 mg of iron total) by mouth 2 (two) times daily. Or as directed. Take with Vitamin -C Patient taking differently: Take 1 tablet by mouth 2 (two) times a  week. Or as directed. Take with Vitamin -C 05/04/14  Yes Lennis PMarion Downer MD  fluticasone (FLONASE) 50 MCG/ACT nasal spray Place 2 sprays into the nose daily as needed for rhinitis. For  nasal congestion   Yes Historical Provider, MD  furosemide (LASIX) 40 MG tablet TAKE TWO TABLETS ONCE DAILY 03/09/14  Yes Larey Dresser, MD  glucagon (GLUCAGON EMERGENCY) 1 MG injection Inject 1 mg into the vein once as needed (for severe reaction).   Yes Historical Provider, MD  Insulin Human (INSULIN PUMP) 100 unit/ml SOLN Inject into the skin continuous. Apidra Basal Rate midnight-400 : 0.3 400-700: 0.25 700-830: 2.5 830- 1230: 3.0 1230-1900: 2.5 1900-2400: 3.2   Yes Historical Provider, MD  isosorbide mononitrate (IMDUR) 60 MG 24 hr tablet Take 90 mg by mouth daily. 2 daily 04/30/14  Yes Burtis Junes, NP  Magnesium 250 MG TABS Take 250 mg by mouth daily.    Yes Historical Provider, MD  metoprolol (LOPRESSOR) 50 MG tablet Take 75 mg by mouth 2 (two) times daily.  10/01/13  Yes Imogene Burn, PA-C  Multiple Vitamins-Minerals (MULTIVITAMIN WITH MINERALS) tablet Take 1 tablet by mouth daily.     Yes Historical Provider, MD  nitroGLYCERIN (NITROLINGUAL) 0.4 MG/SPRAY spray Place 1 spray under the tongue every 5 (five) minutes as needed for chest pain. 05/06/14  Yes Larey Dresser, MD  omeprazole (PRILOSEC OTC) 20 MG tablet Take 20 mg by mouth daily.   Yes Historical Provider, MD  polyethylene glycol (MIRALAX / GLYCOLAX) packet Take 8.5 g by mouth daily as needed. For constipation   Yes Historical Provider, MD  potassium chloride SA (K-DUR,KLOR-CON) 20 MEQ tablet TAKE TWO (2) TABLETS BY MOUTH DAILY   Yes Evans Lance, MD  pravastatin (PRAVACHOL) 10 MG tablet TAKE ONE (1) TABLET BY MOUTH EVERY DAY 03/24/14  Yes Larey Dresser, MD  vitamin C (ASCORBIC ACID) 500 MG tablet Take 250 mg by mouth daily.    Yes Historical Provider, MD  Wheat Dextrin (BENEFIBER PO) Take 45 mLs by mouth every morning. 3 tablespoons  each morning   Yes Historical Provider, MD  ZETIA 10 MG tablet TAKE ONE TABLET BY MOUTH EVERY NIGHT AT BEDTIME 06/05/13  Yes Larey Dresser, MD  metoprolol (LOPRESSOR) 50 MG tablet TAKE ONE AND ONE-HALF TABLET TWO TIMES ADAY Patient not taking: Reported on 06/06/2014 05/28/14   Larey Dresser, MD     Physical Exam: Filed Vitals:   06/06/14 0400 06/06/14 0430 06/06/14 0515 06/06/14 0530  BP: 159/55 122/95 125/47   Pulse: 79 76 78   Temp:   98.4 F (36.9 C)   TempSrc:   Oral   Resp: _0 Height:   _1  (1.575 m)   Weight:   56.473 kg (124 lb 8 oz)   SpO2: 98% 98% 98% 98%     Constitutional: Vital signs reviewed. Patient is a well-developed and well-nourished in no acute distress and cooperative with exam. Alert and oriented x3.  Head: Normocephalic and atraumatic  Ear: TM normal bilaterally  Mouth: no erythema or exudates, MMM  Eyes: PERRL, EOMI, conjunctivae normal, No scleral icterus.  Neck: Supple, Trachea midline normal ROM, No JVD, mass, thyromegaly, or carotid bruit present.  Cardiovascular: RRR, S1 normal, S2 normal, no MRG, pulses symmetric and intact bilaterally  Pulmonary/Chest: CTAB, no wheezes, rales, or rhonchi  Abdominal: Soft. Non-tender, non-distended, bowel sounds are normal, no masses, organomegaly, or guarding present.  GU: no CVA tenderness Musculoskeletal: No joint deformities, erythema, or stiffness, ROM full and no nontender Ext: no edema and no cyanosis, pulses palpable bilaterally (DP and PT)  Hematology: no cervical, inginal, or axillary adenopathy.  Neurological: A&O x3,  Strenght is normal and symmetric bilaterally, cranial nerve II-XII are grossly intact, no focal motor deficit, sensory intact to light touch bilaterally.  Skin: Warm, dry and intact. No rash, cyanosis, or clubbing.  Psychiatric: Normal mood and affect. speech and behavior is normal. Judgment and thought content normal. Cognition and memory are normal.       Labs on Admission:     Basic Metabolic Panel:  Recent Labs Lab 06/06/14 0144 06/06/14 0800  NA 141 139  K 3.8 4.0  CL 105 101  CO2  --  25  GLUCOSE 289* 208*  BUN 31* 28*  CREATININE 1.20* 1.06  CALCIUM  --  10.0   Liver Function Tests: No results for input(s): AST, ALT, ALKPHOS, BILITOT, PROT, ALBUMIN in the last 168 hours. No results for input(s): LIPASE, AMYLASE in the last 168 hours. No results for input(s): AMMONIA in the last 168 hours. CBC:  Recent Labs Lab 06/06/14 0133 06/06/14 0144 06/06/14 0800  WBC 8.9  --  7.5  NEUTROABS 6.3  --   --   HGB 12.2 13.3 11.6*  HCT 37.5 39.0 35.7*  MCV 91.9  --  92.0  PLT 141*  --  143*   Cardiac Enzymes: No results for input(s): CKTOTAL, CKMB, CKMBINDEX, TROPONINI in the last 168 hours.  BNP (last 3 results)  Recent Labs  08/26/13 1023 02/22/14 0937  PROBNP 71.0 85.0      CBG:  Recent Labs Lab 06/06/14 0614  GLUCAP 238*    Radiological Exams on Admission: Dg Chest 2 View  06/06/2014   CLINICAL DATA:  Chest pain for 2 weeks, worsened tonight, no relief with nitro spray  EXAM: CHEST  2 VIEW  COMPARISON:  02/04/2013  FINDINGS: There is no focal parenchymal opacity, pleural effusion, or pneumothorax. The heart and mediastinum are stable. There is evidence of prior aortic valve replacement. There is a dual lead pacemaker.  The osseous structures are unremarkable.  IMPRESSION: No active cardiopulmonary disease.   Electronically Signed   By: Kathreen Devoid   On: 06/06/2014 02:25      Assessment/Plan Active Problems:   Unstable angina insulin-dependent diabetes   Diabetes type 2, insulin-dependent on an insulin pump Will recheck hemoglobin A1c History of local and systemic allergic reaction to NovoLog and Humalog insulin which appears to have improved with switching to Apidra  Patient has variable basal rate that changes several times during the day Patient is comfortable with putting her basal rate on her insulin pump to 50%,  after midnight during the time that she is nothing by mouth We will check cbg every hour after midnight, Patient will receive sliding scale insulin regular vs D50 as needed based on her CBG  Anticipate that the diabetes coordinator will see the patient tomorrow   Thank you for this interesting consult will continue to follow   Code Status:   full Family Communication: bedside Disposition Plan: admit   Time spent: 70 mins   [facility] Pager [phone]  If 7PM-7AM, please contact night-coverage [email or web address] Password [email or web address] 06/06/2014, 12:20 PM

## 2014-06-06 NOTE — Progress Notes (Addendum)
Inpatient Diabetes Program Recommendations  AACE/ADA: New Consensus Statement on Inpatient Glycemic Control (2013)  Target Ranges:  Prepandial:   less than 140 mg/dL      Peak postprandial:   less than 180 mg/dL (1-2 hours)      Critically ill patients:  140 - 180 mg/dL   Reason for Visit: Diabetes Consult - Insulin Pump  ALLERGIC TO NOVOLOG AND HUMALOG. USES APIDRA IN PUMP.  Diabetes history: DM1 Outpatient Diabetes medications: Apidra per Insulin Pump Current orders for Inpatient glycemic control: Insulin Pump Order Set  63 year old female with DM1 admitted with CP. Has insulin pump in place. Pt extremely knowledgeable regarding insulin pump. Will be NPO past MN tonight. If blood sugars >200, will decrease basal rate by 50% until cath is completed. If blood sugars <200, will likely decrease basal rate to 10% to avoid hypoglycemia, which has occurred in the past.  Basal and Bolus Rates documented in Progress Note dated 03/16/2014 per Alferd Apa, [name].  Discussed above with RN and bolus doses recorded. Also spoke with pt regarding insulin pump therapy.  Please call for further questions. Thank you. Lorenda Peck, RD, LDN, CDE Inpatient Diabetes Coordinator [phone]  HgbA1C pending.

## 2014-06-07 ENCOUNTER — Encounter (HOSPITAL_COMMUNITY)
Admission: EM | Disposition: A | Discharge status: Home / Self Care | Payer: BC Managed Care – PPO | Source: Home / Self Care | Attending: Cardiology

## 2014-06-07 DIAGNOSIS — I480 Paroxysmal atrial fibrillation: Secondary | ICD-10-CM

## 2014-06-07 DIAGNOSIS — I369 Nonrheumatic tricuspid valve disorder, unspecified: Secondary | ICD-10-CM

## 2014-06-07 DIAGNOSIS — I25110 Atherosclerotic heart disease of native coronary artery with unstable angina pectoris: Secondary | ICD-10-CM

## 2014-06-07 DIAGNOSIS — D649 Anemia, unspecified: Secondary | ICD-10-CM

## 2014-06-07 DIAGNOSIS — N182 Chronic kidney disease, stage 2 (mild): Secondary | ICD-10-CM

## 2014-06-07 DIAGNOSIS — E785 Hyperlipidemia, unspecified: Secondary | ICD-10-CM

## 2014-06-07 HISTORY — PX: LEFT HEART CATHETERIZATION WITH CORONARY/GRAFT ANGIOGRAM: SHX5450

## 2014-06-07 LAB — CBC
HCT: 34.6 % — ABNORMAL LOW (ref 36.0–46.0)
Hemoglobin: 11.1 g/dL — ABNORMAL LOW (ref 12.0–15.0)
MCH: 29.7 pg (ref 26.0–34.0)
MCHC: 32.1 g/dL (ref 30.0–36.0)
MCV: 92.5 fL (ref 78.0–100.0)
PLATELETS: 136 10*3/uL — AB (ref 150–400)
RBC: 3.74 MIL/uL — AB (ref 3.87–5.11)
RDW: 13.8 % (ref 11.5–15.5)
WBC: 7.3 10*3/uL (ref 4.0–10.5)

## 2014-06-07 LAB — PROTIME-INR
INR: 1.18 (ref 0.00–1.49)
Prothrombin Time: 15.1 seconds (ref 11.6–15.2)

## 2014-06-07 LAB — GLUCOSE, CAPILLARY
GLUCOSE-CAPILLARY: 123 mg/dL — AB (ref 70–99)
GLUCOSE-CAPILLARY: 149 mg/dL — AB (ref 70–99)
GLUCOSE-CAPILLARY: 181 mg/dL — AB (ref 70–99)
GLUCOSE-CAPILLARY: 227 mg/dL — AB (ref 70–99)
Glucose-Capillary: 184 mg/dL — ABNORMAL HIGH (ref 70–99)
Glucose-Capillary: 178 mg/dL — ABNORMAL HIGH (ref 70–99)
Glucose-Capillary: 175 mg/dL — ABNORMAL HIGH (ref 70–99)
Glucose-Capillary: 172 mg/dL — ABNORMAL HIGH (ref 70–99)
Glucose-Capillary: 128 mg/dL — ABNORMAL HIGH (ref 70–99)
Glucose-Capillary: 116 mg/dL — ABNORMAL HIGH (ref 70–99)
Glucose-Capillary: 141 mg/dL — ABNORMAL HIGH (ref 70–99)

## 2014-06-07 LAB — BASIC METABOLIC PANEL
Anion gap: 14 (ref 5–15)
BUN: 29 mg/dL — ABNORMAL HIGH (ref 6–23)
CHLORIDE: 104 meq/L (ref 96–112)
CO2: 25 mEq/L (ref 19–32)
Calcium: 9.8 mg/dL (ref 8.4–10.5)
Creatinine, Ser: 1.18 mg/dL — ABNORMAL HIGH (ref 0.50–1.10)
GFR calc Af Amer: 56 mL/min — ABNORMAL LOW (ref >90)
GFR, EST NON AFRICAN AMERICAN: 49 mL/min — AB (ref >90)
GLUCOSE: 185 mg/dL — AB (ref 70–99)
POTASSIUM: 3.9 meq/L (ref 3.7–5.3)
Sodium: 143 mEq/L (ref 137–147)

## 2014-06-07 LAB — TROPONIN I: Troponin I: <0.3 ng/mL (ref <0.30)

## 2014-06-07 LAB — HEMOGLOBIN A1C
Hgb A1c MFr Bld: 7.9 % — ABNORMAL HIGH (ref <5.7)
MEAN PLASMA GLUCOSE: 180 mg/dL — AB (ref <117)

## 2014-06-07 LAB — HEPARIN LEVEL (UNFRACTIONATED): Heparin Unfractionated: 0.31 IU/mL (ref 0.30–0.70)

## 2014-06-07 SURGERY — LEFT HEART CATHETERIZATION WITH CORONARY/GRAFT ANGIOGRAM
Anesthesia: LOCAL

## 2014-06-07 MED ORDER — TICAGRELOR 90 MG PO TABS
ORAL_TABLET | ORAL | Status: AC
  Filled 2014-06-07: qty 1

## 2014-06-07 MED ORDER — MIDAZOLAM HCL 2 MG/2ML IJ SOLN
INTRAMUSCULAR | Status: AC
  Filled 2014-06-07: qty 2

## 2014-06-07 MED ORDER — METOPROLOL TARTRATE 1 MG/ML IV SOLN
INTRAVENOUS | Status: AC
  Filled 2014-06-07: qty 5

## 2014-06-07 MED ORDER — SODIUM CHLORIDE 0.9 % IV SOLN
INTRAVENOUS | Status: AC
Start: 2014-06-07 — End: 2014-06-07
  Administered 2014-06-07: 15:00:00 via INTRAVENOUS

## 2014-06-07 MED ORDER — NITROGLYCERIN 1 MG/10 ML FOR IR/CATH LAB
INTRA_ARTERIAL | Status: AC
  Filled 2014-06-07: qty 10

## 2014-06-07 MED ORDER — HEPARIN (PORCINE) IN NACL 2-0.9 UNIT/ML-% IJ SOLN
INTRAMUSCULAR | Status: AC
  Filled 2014-06-07: qty 1000

## 2014-06-07 MED ORDER — LIDOCAINE HCL (PF) 1 % IJ SOLN
INTRAMUSCULAR | Status: AC
  Filled 2014-06-07: qty 30

## 2014-06-07 MED ORDER — FUROSEMIDE 40 MG PO TABS
40.0000 mg | ORAL_TABLET | Freq: Every day | ORAL | Status: DC
Start: 2014-06-08 — End: 2014-06-08
  Administered 2014-06-08: 40 mg via ORAL
  Filled 2014-06-07: qty 1

## 2014-06-07 MED ORDER — FENTANYL CITRATE 0.05 MG/ML IJ SOLN
INTRAMUSCULAR | Status: AC
  Filled 2014-06-07: qty 2

## 2014-06-07 MED ORDER — BIVALIRUDIN 250 MG IV SOLR
INTRAVENOUS | Status: AC
  Filled 2014-06-07: qty 250

## 2014-06-07 NOTE — Progress Notes (Signed)
Pt blood glucose 184. Pt currently receiving 40% of basal rate from her insulin pump.  Claudette Stapler, RN

## 2014-06-07 NOTE — Progress Notes (Signed)
Pt blood glucose 178. Pt currently receiving 40% of basal rate from her insulin pump Maddux Vanscyoc B, RN

## 2014-06-07 NOTE — Plan of Care (Signed)
Problem: Phase I Progression Outcomes Goal: Anginal pain relieved Outcome: Completed/Met Date Met:  06/07/14

## 2014-06-07 NOTE — H&P (View-Only) (Signed)
Patient ID: [name] [name], female   DOB: Mar 31, 1951, 63 y.o.   MRN: [id number]   SUBJECTIVE: No further chest pain.  Feels ok currently.   Scheduled Meds: . [START ON 06/08/2014] aspirin EC  81 mg Oral QHS  . calcium-vitamin D  1 tablet Oral BID WC  . cholecalciferol  1,000 Units Oral Daily  . docusate sodium  100 mg Oral BID  . ezetimibe  10 mg Oral Daily  . fenofibrate  160 mg Oral Daily  . ferrous fumarate  1 tablet Oral BID  . [START ON 06/08/2014] furosemide  40 mg Oral Daily  . insulin regular  0-9 Units Subcutaneous TID WC  . isosorbide mononitrate  90 mg Oral Daily  . magnesium oxide  200 mg Oral Daily  . metoprolol  75 mg Oral BID  . multivitamin with minerals  1 tablet Oral Daily  . pantoprazole  40 mg Oral Daily  . potassium chloride SA  20 mEq Oral Daily  . pravastatin  10 mg Oral q1800  . sodium chloride  3 mL Intravenous Q12H  . ticagrelor  90 mg Oral BID  . triamcinolone   Topical BID  . vitamin C  250 mg Oral Daily   Continuous Infusions: . sodium chloride 50 mL/hr at 06/07/14 0630  . heparin 900 Units/hr (06/07/14 0627)   PRN Meds:.sodium chloride, acetaminophen, ALPRAZolam, clobetasol cream, fluticasone, nitroGLYCERIN, ondansetron (ZOFRAN) IV, polyethylene glycol, sodium chloride    Filed Vitals:   06/06/14 0530 06/06/14 1401 06/06/14 1958 06/07/14 0354  BP:  122/44 123/44 111/48  Pulse:  77 81 76  Temp:  97.6 F (36.4 C) 97.6 F (36.4 C) 97.6 F (36.4 C)  TempSrc:  Oral Oral Oral  Resp:  18 16 16   Height:      Weight:      SpO2: 98% 98% 99% 97%    Intake/Output Summary (Last 24 hours) at 06/07/14 0950 Last data filed at 06/07/14 0700  Gross per 24 hour  Intake 1204.17 ml  Output   3050 ml  Net -1845.83 ml    LABS: Basic Metabolic Panel:  Recent Labs  06/06/14 2050 06/07/14 0330  NA 140 143  K 4.0 3.9  CL 100 104  CO2 24 25  GLUCOSE 146* 185*  BUN 30* 29*  CREATININE 1.25* 1.18*  CALCIUM 10.0 9.8   Liver Function Tests: No  results for input(s): AST, ALT, ALKPHOS, BILITOT, PROT, ALBUMIN in the last 72 hours. No results for input(s): LIPASE, AMYLASE in the last 72 hours. CBC:  Recent Labs  06/06/14 0133  06/06/14 2050 06/07/14 0330  WBC 8.9  < > 7.2 7.3  NEUTROABS 6.3  --   --   --   HGB 12.2  < > 11.9* 11.1*  HCT 37.5  < > 37.2 34.6*  MCV 91.9  < > 93.0 92.5  PLT 141*  < > 142* 136*  < > = values in this interval not displayed. Cardiac Enzymes: No results for input(s): CKTOTAL, CKMB, CKMBINDEX, TROPONINI in the last 72 hours. BNP: Invalid input(s): POCBNP D-Dimer: No results for input(s): DDIMER in the last 72 hours. Hemoglobin A1C:  Recent Labs  06/06/14 0800  HGBA1C 7.9*  7.9*   Fasting Lipid Panel:  Recent Labs  06/06/14 0800  CHOL 151  HDL 35*  LDLCALC 60  TRIG 279*  CHOLHDL 4.3   Thyroid Function Tests: No results for input(s): TSH, T4TOTAL, T3FREE, THYROIDAB in the last 72 hours.  Invalid input(s): FREET3 Anemia  Panel: No results for input(s): VITAMINB12, FOLATE, FERRITIN, TIBC, IRON, RETICCTPCT in the last 72 hours.  RADIOLOGY: Dg Chest 2 View  06/06/2014   CLINICAL DATA:  Chest pain for 2 weeks, worsened tonight, no relief with nitro spray  EXAM: CHEST  2 VIEW  COMPARISON:  02/04/2013  FINDINGS: There is no focal parenchymal opacity, pleural effusion, or pneumothorax. The heart and mediastinum are stable. There is evidence of prior aortic valve replacement. There is a dual lead pacemaker.  The osseous structures are unremarkable.  IMPRESSION: No active cardiopulmonary disease.   Electronically Signed   By: Kathreen Devoid   On: 06/06/2014 02:25    PHYSICAL EXAM General: NAD Neck: No JVD, no thyromegaly or thyroid nodule.  Lungs: Clear to auscultation bilaterally with normal respiratory effort. CV: Nondisplaced PMI.  Heart regular S1/S2, no S3/S4, 2/6 early SEM RUSB.  No peripheral edema.   Abdomen: Soft, nontender, no hepatosplenomegaly, no distention.  Neurologic: Alert  and oriented x 3.  Psych: Normal affect. Extremities: No clubbing or cyanosis.   TELEMETRY: Reviewed telemetry pt in NSR  ASSESSMENT AND PLAN: 63 yo with history of CAD, diastolic CHF, insulin-dependent diabetes presented with unstable angina.  1. CAD: s/p SVG-RCA in 12/05, then redo SVG-RCA in 1/11. She was found to have distal left main/proximal LAD severe stenosis in 9/14. She was very symptomatic. PCI with DES to distal LM/proximal LAD was done rather than bypass as she would have been a poor candidate for redo sternotomy (2 prior sternotomies and history of chest wall radiation for Hodgkins disease). She will need to be on Brilinta and ASA (poor responder to plavix) long-term. This is complicated by suspected slow GI bleed. So far, this has been controlled with periodic transfusions (hemoglobin has been stable recently).  ETT-Cardiolite in 4/15 showed no ischemia or infarction on perfusion images but there were ECG changes in recovery and she had mild chest pain. She may have microvascular angina as the cause of her symptoms. Over the last few weeks, she has had more chest pain.  It happens often at night after supper and sometimes during the day at random times.  Not clearly exertional.  She was admitted over the weekend after having 40 minutes severe chest pain in bed at night.  ECG with borderline signifcant inferior Qs that were not present before, 1 set cardiac enzymes negative.  - Continue ASA 81, statin, metoprolol, Brilinta, Imdur. She is on heparin gtt.  - Given worsening symptoms and concern for unstable angina, think she needs cardiac cath today. Avoid LV-gram with CKD.    - Ranolazine unfortunately probably contraindicated due to history of cirrhosis.   2. Bioprosthetic aortic valve replacement: Stable on last echo. Will get echo today (no LV-gram due to CKD).  3. Diastolic CHF: Volume status ok.  Hold Lasix today pre-cath, restart tomorrow. 4. CKD: Patient has a single  kidney s/p right nephrectomy. Careful with contrast. 5. Hyperlipidemia: She has not been able to tolerate any higher potency statin than pravastatin 10 mg daily. She is on Zetia. LDL was 106 in 1/15. She still has myalgias on low dose pravastatin. I would like to get her on PCSK-9 inhibitor after discharge.   6. Anemia: Suspected slow GI bleed. Recent unremarkable EGD and capsule endoscopy in fall 2014. Unfortunately, needs to stay on Brilinta. Will need to follow closely (sees hematology), plan for transfusion with hemoglobin < 8 as this allows best control of her symptoms. CBC today is ok. 7. Atrial fibrillation: Paroxysmal,  brief episodes noted on PCM interrogation. Given GI bleeding and need to be on both ASA 81 and Brilinta, risks likely outweigh benefits of anticoagulation.   [name] 06/07/2014 9:56 AM

## 2014-06-07 NOTE — Interval H&P Note (Signed)
History and Physical Interval Note:  06/07/2014 12:43 PM  [name] [name]  has presented today for cardiac cath with the diagnosis of unstable angina.  The various methods of treatment have been discussed with the patient and family. After consideration of risks, benefits and other options for treatment, the patient has consented to  Procedure(s): LEFT HEART CATHETERIZATION WITH CORONARY/GRAFT ANGIOGRAM (N/A) as a surgical intervention .  The patient's history has been reviewed, patient examined, no change in status, stable for surgery.  I have reviewed the patient's chart and labs.  Questions were answered to the patient's satisfaction.    Cath Lab Visit (complete for each Cath Lab visit)  Clinical Evaluation Leading to the Procedure:   ACS: No.  Non-ACS:    Anginal Classification: CCS III  Anti-ischemic medical therapy: Maximal Therapy (2 or more classes of medications)  Non-Invasive Test Results: No non-invasive testing performed  Prior CABG: Previous CABG         [name] [name]

## 2014-06-07 NOTE — Plan of Care (Signed)
Problem: Phase I Progression Outcomes Goal: Hemodynamically stable Outcome: Completed/Met Date Met:  06/07/14     

## 2014-06-07 NOTE — Progress Notes (Signed)
Patient ID: [name] [name], female   DOB: 1951-01-20, 63 y.o.   MRN: [id number]   SUBJECTIVE: No further chest pain.  Feels ok currently.   Scheduled Meds: . [START ON 06/08/2014] aspirin EC  81 mg Oral QHS  . calcium-vitamin D  1 tablet Oral BID WC  . cholecalciferol  1,000 Units Oral Daily  . docusate sodium  100 mg Oral BID  . ezetimibe  10 mg Oral Daily  . fenofibrate  160 mg Oral Daily  . ferrous fumarate  1 tablet Oral BID  . [START ON 06/08/2014] furosemide  40 mg Oral Daily  . insulin regular  0-9 Units Subcutaneous TID WC  . isosorbide mononitrate  90 mg Oral Daily  . magnesium oxide  200 mg Oral Daily  . metoprolol  75 mg Oral BID  . multivitamin with minerals  1 tablet Oral Daily  . pantoprazole  40 mg Oral Daily  . potassium chloride SA  20 mEq Oral Daily  . pravastatin  10 mg Oral q1800  . sodium chloride  3 mL Intravenous Q12H  . ticagrelor  90 mg Oral BID  . triamcinolone   Topical BID  . vitamin C  250 mg Oral Daily   Continuous Infusions: . sodium chloride 50 mL/hr at 06/07/14 0630  . heparin 900 Units/hr (06/07/14 0627)   PRN Meds:.sodium chloride, acetaminophen, ALPRAZolam, clobetasol cream, fluticasone, nitroGLYCERIN, ondansetron (ZOFRAN) IV, polyethylene glycol, sodium chloride    Filed Vitals:   06/06/14 0530 06/06/14 1401 06/06/14 1958 06/07/14 0354  BP:  122/44 123/44 111/48  Pulse:  77 81 76  Temp:  97.6 F (36.4 C) 97.6 F (36.4 C) 97.6 F (36.4 C)  TempSrc:  Oral Oral Oral  Resp:  18 16 16   Height:      Weight:      SpO2: 98% 98% 99% 97%    Intake/Output Summary (Last 24 hours) at 06/07/14 0950 Last data filed at 06/07/14 0700  Gross per 24 hour  Intake 1204.17 ml  Output   3050 ml  Net -1845.83 ml    LABS: Basic Metabolic Panel:  Recent Labs  06/06/14 2050 06/07/14 0330  NA 140 143  K 4.0 3.9  CL 100 104  CO2 24 25  GLUCOSE 146* 185*  BUN 30* 29*  CREATININE 1.25* 1.18*  CALCIUM 10.0 9.8   Liver Function Tests: No  results for input(s): AST, ALT, ALKPHOS, BILITOT, PROT, ALBUMIN in the last 72 hours. No results for input(s): LIPASE, AMYLASE in the last 72 hours. CBC:  Recent Labs  06/06/14 0133  06/06/14 2050 06/07/14 0330  WBC 8.9  < > 7.2 7.3  NEUTROABS 6.3  --   --   --   HGB 12.2  < > 11.9* 11.1*  HCT 37.5  < > 37.2 34.6*  MCV 91.9  < > 93.0 92.5  PLT 141*  < > 142* 136*  < > = values in this interval not displayed. Cardiac Enzymes: No results for input(s): CKTOTAL, CKMB, CKMBINDEX, TROPONINI in the last 72 hours. BNP: Invalid input(s): POCBNP D-Dimer: No results for input(s): DDIMER in the last 72 hours. Hemoglobin A1C:  Recent Labs  06/06/14 0800  HGBA1C 7.9*  7.9*   Fasting Lipid Panel:  Recent Labs  06/06/14 0800  CHOL 151  HDL 35*  LDLCALC 60  TRIG 279*  CHOLHDL 4.3   Thyroid Function Tests: No results for input(s): TSH, T4TOTAL, T3FREE, THYROIDAB in the last 72 hours.  Invalid input(s): FREET3 Anemia  Panel: No results for input(s): VITAMINB12, FOLATE, FERRITIN, TIBC, IRON, RETICCTPCT in the last 72 hours.  RADIOLOGY: Dg Chest 2 View  06/06/2014   CLINICAL DATA:  Chest pain for 2 weeks, worsened tonight, no relief with nitro spray  EXAM: CHEST  2 VIEW  COMPARISON:  02/04/2013  FINDINGS: There is no focal parenchymal opacity, pleural effusion, or pneumothorax. The heart and mediastinum are stable. There is evidence of prior aortic valve replacement. There is a dual lead pacemaker.  The osseous structures are unremarkable.  IMPRESSION: No active cardiopulmonary disease.   Electronically Signed   By: Kathreen Devoid   On: 06/06/2014 02:25    PHYSICAL EXAM General: NAD Neck: No JVD, no thyromegaly or thyroid nodule.  Lungs: Clear to auscultation bilaterally with normal respiratory effort. CV: Nondisplaced PMI.  Heart regular S1/S2, no S3/S4, 2/6 early SEM RUSB.  No peripheral edema.   Abdomen: Soft, nontender, no hepatosplenomegaly, no distention.  Neurologic: Alert  and oriented x 3.  Psych: Normal affect. Extremities: No clubbing or cyanosis.   TELEMETRY: Reviewed telemetry pt in NSR  ASSESSMENT AND PLAN: 63 yo with history of CAD, diastolic CHF, insulin-dependent diabetes presented with unstable angina.  1. CAD: s/p SVG-RCA in 12/05, then redo SVG-RCA in 1/11. She was found to have distal left main/proximal LAD severe stenosis in 9/14. She was very symptomatic. PCI with DES to distal LM/proximal LAD was done rather than bypass as she would have been a poor candidate for redo sternotomy (2 prior sternotomies and history of chest wall radiation for Hodgkins disease). She will need to be on Brilinta and ASA (poor responder to plavix) long-term. This is complicated by suspected slow GI bleed. So far, this has been controlled with periodic transfusions (hemoglobin has been stable recently).  ETT-Cardiolite in 4/15 showed no ischemia or infarction on perfusion images but there were ECG changes in recovery and she had mild chest pain. She may have microvascular angina as the cause of her symptoms. Over the last few weeks, she has had more chest pain.  It happens often at night after supper and sometimes during the day at random times.  Not clearly exertional.  She was admitted over the weekend after having 40 minutes severe chest pain in bed at night.  ECG with borderline signifcant inferior Qs that were not present before, 1 set cardiac enzymes negative.  - Continue ASA 81, statin, metoprolol, Brilinta, Imdur. She is on heparin gtt.  - Given worsening symptoms and concern for unstable angina, think she needs cardiac cath today. Avoid LV-gram with CKD.    - Ranolazine unfortunately probably contraindicated due to history of cirrhosis.   2. Bioprosthetic aortic valve replacement: Stable on last echo. Will get echo today (no LV-gram due to CKD).  3. Diastolic CHF: Volume status ok.  Hold Lasix today pre-cath, restart tomorrow. 4. CKD: Patient has a single  kidney s/p right nephrectomy. Careful with contrast. 5. Hyperlipidemia: She has not been able to tolerate any higher potency statin than pravastatin 10 mg daily. She is on Zetia. LDL was 106 in 1/15. She still has myalgias on low dose pravastatin. I would like to get her on PCSK-9 inhibitor after discharge.   6. Anemia: Suspected slow GI bleed. Recent unremarkable EGD and capsule endoscopy in fall 2014. Unfortunately, needs to stay on Brilinta. Will need to follow closely (sees hematology), plan for transfusion with hemoglobin < 8 as this allows best control of her symptoms. CBC today is ok. 7. Atrial fibrillation: Paroxysmal,  brief episodes noted on PCM interrogation. Given GI bleeding and need to be on both ASA 81 and Brilinta, risks likely outweigh benefits of anticoagulation.   [name] 06/07/2014 9:56 AM

## 2014-06-07 NOTE — Plan of Care (Signed)
Problem: Consults Goal: PCI Patient Education (See Patient Education module for education specifics.) Outcome: Completed/Met Date Met:  06/07/14

## 2014-06-07 NOTE — Progress Notes (Signed)
Pt blood glucose 181.  Pt currently receiving 40% of her basal rate from insulin pump.  Claudette Stapler, RN

## 2014-06-07 NOTE — Progress Notes (Signed)
ANTICOAGULATION CONSULT NOTE -Follow up  Pharmacy Consult for Heparin Indication: chest pain/ACS  Allergies  Allergen Reactions  . Clindamycin/Lincomycin Anaphylaxis and Shortness Of Breath  . Humalog [Insulin Lispro] Itching  . Penicillins Anaphylaxis, Hives and Rash    Childhood allergy   . Gentamycin [Gentamicin] Hives, Itching and Rash    Reaction noted post loop recorder implant, where gentamycin was used for irrigation. Topical prep  . Tizanidine Other (See Comments)    Hallucinate  . Atorvastatin Other (See Comments)     increased LFT's  . Avandia [Rosiglitazone Maleate]     Congestive heart failure  . Cholestyramine     Elevated LFTs  . Codeine Nausea And Vomiting  . Crestor [Rosuvastatin] Other (See Comments)    Muscle and joint pain &  . Erythromycin Nausea And Vomiting  . Metformin     Congestive heart failure  . Novolog [Insulin Aspart] Itching    Patient reports she has severe itching with Novolog insulin  . Plavix [Clopidogrel Bisulfate]     P2Y12 testing = 271 while on Plavix  . Plavix [Clopidogrel]     Does not work for patient  . Pravastatin Other (See Comments)    In high doses (20-36m) causes muscle and joint pain   . Simvastatin     Increased LFT's  . Tamiflu [Oseltamivir] Nausea And Vomiting  . Cephalexin Swelling and Rash    Extremities swell   . Iodinated Diagnostic Agents Other (See Comments)    PAIN DURING LYMPHANGIOGRAM '86, NO ALLERGY TO IV CONTRAST  . Neosporin [Neomycin-Bacitracin Zn-Polymyx] Rash    Patient Measurements: Height: 5' 2"  (157.5 cm) Weight: 124 lb 8 oz (56.473 kg) IBW/kg (Calculated) : 50.1  Vital Signs: Temp: 97.6 F (36.4 C) (11/16 0354) Temp Source: Oral (11/16 0354) BP: 111/48 mmHg (11/16 0354) Pulse Rate: 76 (11/16 0354)  Labs:  Recent Labs  06/06/14 0800 06/06/14 1135 06/06/14 2050 06/07/14 0330  HGB 11.6*  --  11.9* 11.1*  HCT 35.7*  --  37.2 34.6*  PLT 143*  --  142* 136*  LABPROT 14.3  --  15.4*  15.1  INR 1.10  --  1.21 1.18  HEPARINUNFRC  --  0.17* 0.25* 0.31  CREATININE 1.06  --  1.25* 1.18*    Estimated Creatinine Clearance: 39.6 mL/min (by C-G formula based on Cr of 1.18).   Assessment: 63y.o. female admitted 06/06/2014  with chest pain for heparin.    Heparin level is therapeutic on 900 units/hr.  Noted plans for cath today.  Goal of Therapy:  Heparin level 0.3-0.7 units/ml Monitor platelets by anticoagulation protocol: Yes   Plan:  Continue heparin at 900 units/hr Daily heparin level and CBC Follow-up after cath.  [employer] Pharm.D., BCPS Clinical Pharmacist Pager [phone]/16/2015 9:30 AM

## 2014-06-07 NOTE — Progress Notes (Signed)
  Echocardiogram 2D Echocardiogram has been performed.  [name] [name] 06/07/2014, 11:51 AM

## 2014-06-07 NOTE — Progress Notes (Addendum)
[facility] PROGRESS NOTE  [name] LANESE [name]:[id number] DOB: 23-Oct-1950 DOA: 06/06/2014 PCP: Leonides Grills, MD  Assessment/Plan: Active Problems:   Unstable angina .  Diabetes mellitus type 11 63 year old female with DM1 admitted with CP. Has insulin pump in place. Pt extremely knowledgeable regarding insulin pump. Currently nothing by mouth Seen by diabetes coordinator yesterday as per the recommendations If blood sugars >200, will decrease basal rate by 50% until cath is completed. If blood sugars <200, will likely decrease basal rate to 10% to avoid hypoglycemia, which has occurred in the past.  Patient currently receiving 40% of her basal rate from insulin pump,CBG stable We'll sign off , as insulin pump will be further managed by the diabetes coordinator   HPI/Subjective: No complaints  Objective: Filed Vitals:   06/06/14 0530 06/06/14 1401 06/06/14 1958 06/07/14 0354  BP:  122/44 123/44 111/48  Pulse:  77 81 76  Temp:  97.6 F (36.4 C) 97.6 F (36.4 C) 97.6 F (36.4 C)  TempSrc:  Oral Oral Oral  Resp:  18 16 16   Height:      Weight:      SpO2: 98% 98% 99% 97%    Intake/Output Summary (Last 24 hours) at 06/07/14 1137 Last data filed at 06/07/14 0700  Gross per 24 hour  Intake 1204.17 ml  Output   3050 ml  Net -1845.83 ml    Exam:  General: alert & oriented x 3 In NAD  Cardiovascular: RRR, nl S1 s2  Respiratory: Decreased breath sounds at the bases, scattered rhonchi, no crackles  Abdomen: soft +BS NT/ND, no masses palpable  Extremities: No cyanosis and no edema      Data Reviewed: Basic Metabolic Panel:  Recent Labs Lab 06/06/14 0144 06/06/14 0800 06/06/14 2050 06/07/14 0330  NA 141 139 140 143  K 3.8 4.0 4.0 3.9  CL 105 101 100 104  CO2  --  25 24 25   GLUCOSE 289* 208* 146* 185*  BUN 31* 28* 30* 29*  CREATININE 1.20* 1.06 1.25* 1.18*  CALCIUM  --  10.0 10.0 9.8    Liver Function Tests: No results for input(s): AST, ALT,  ALKPHOS, BILITOT, PROT, ALBUMIN in the last 168 hours. No results for input(s): LIPASE, AMYLASE in the last 168 hours. No results for input(s): AMMONIA in the last 168 hours.  CBC:  Recent Labs Lab 06/06/14 0133 06/06/14 0144 06/06/14 0800 06/06/14 2050 06/07/14 0330  WBC 8.9  --  7.5 7.2 7.3  NEUTROABS 6.3  --   --   --   --   HGB 12.2 13.3 11.6* 11.9* 11.1*  HCT 37.5 39.0 35.7* 37.2 34.6*  MCV 91.9  --  92.0 93.0 92.5  PLT 141*  --  143* 142* 136*    Cardiac Enzymes:  Recent Labs Lab 06/07/14 1004  TROPONINI <0.30   BNP (last 3 results)  Recent Labs  08/26/13 1023 02/22/14 0937  PROBNP 71.0 85.0     CBG:  Recent Labs Lab 06/07/14 0248 06/07/14 0346 06/07/14 0457 06/07/14 0627 06/07/14 1008  GLUCAP 184* 178* 181* 175* 172*    No results found for this or any previous visit (from the past 240 hour(s)).   Studies: Dg Chest 2 View  06/06/2014   CLINICAL DATA:  Chest pain for 2 weeks, worsened tonight, no relief with nitro spray  EXAM: CHEST  2 VIEW  COMPARISON:  02/04/2013  FINDINGS: There is no focal parenchymal opacity, pleural effusion, or pneumothorax. The heart and mediastinum  are stable. There is evidence of prior aortic valve replacement. There is a dual lead pacemaker.  The osseous structures are unremarkable.  IMPRESSION: No active cardiopulmonary disease.   Electronically Signed   By: Kathreen Devoid   On: 06/06/2014 02:25    Scheduled Meds: . Derrill Memo ON 06/08/2014] aspirin EC  81 mg Oral QHS  . calcium-vitamin D  1 tablet Oral BID WC  . cholecalciferol  1,000 Units Oral Daily  . docusate sodium  100 mg Oral BID  . ezetimibe  10 mg Oral Daily  . fenofibrate  160 mg Oral Daily  . ferrous fumarate  1 tablet Oral BID  . [START ON 06/08/2014] furosemide  40 mg Oral Daily  . insulin regular  0-9 Units Subcutaneous TID WC  . isosorbide mononitrate  90 mg Oral Daily  . magnesium oxide  200 mg Oral Daily  . metoprolol  75 mg Oral BID  . multivitamin  with minerals  1 tablet Oral Daily  . pantoprazole  40 mg Oral Daily  . potassium chloride SA  20 mEq Oral Daily  . pravastatin  10 mg Oral q1800  . sodium chloride  3 mL Intravenous Q12H  . ticagrelor  90 mg Oral BID  . triamcinolone   Topical BID  . vitamin C  250 mg Oral Daily   Continuous Infusions: . sodium chloride 50 mL/hr at 06/07/14 0630  . heparin 900 Units/hr (06/07/14 0627)    Active Problems:   Unstable angina    Time spent: 40 minutes   [facility] Pager [phone]. If 7PM-7AM, please contact night-coverage at [email or web address], password [email or web address] 06/07/2014, 11:37 AM  LOS: 1 day

## 2014-06-07 NOTE — Progress Notes (Signed)
Pt blood glucose 149.  Continue current basal rate. Claudette Stapler, RN

## 2014-06-07 NOTE — Progress Notes (Signed)
Utilization review completed.  

## 2014-06-07 NOTE — Progress Notes (Signed)
Pt blood glucose 175. Pt currently receiving 40% of her basal rate from insulin pump. Claudette Stapler, RN

## 2014-06-07 NOTE — CV Procedure (Signed)
Cardiac Catheterization Operative Report  [name] [name] [id number] 11/16/20151:56 PM Leonides Grills, MD  Procedure Performed:  1. Selective Coronary Angiography 2. SVG angiography 3. PTCA/DES x 1 ostial left main 4. Angioseal right femoral artery  Operator: Lauree Chandler, MD  Indication:  62 yo female with CAD, prior CABG with SVG to RCA and bioprosthetic valve and then redo CABG with SVG to RCA. Last cath 2014 with severe left main/LAD disease. She was turned down for redo bypass. DES placed left main into LAD. Now readmitted with unstable angina.                                     Procedure Details: The risks, benefits, complications, treatment options, and expected outcomes were discussed with the patient. The patient and/or family concurred with the proposed plan, giving informed consent. The patient was brought to the cath lab after IV hydration was begun and oral premedication was given. The patient was further sedated with Versed and Fentanyl. The right groin was prepped and draped in the usual manner. Using the modified Seldinger access technique, a 5 French sheath was placed in the right femoral artery. Standard diagnostic catheters were used to perform selective coronary angiography.  I engaged the left main with a JL4 catheter. A JR4 was used to non-selectively image the RCA and used to engage the SVG to the distal RCA. The bioprosthetic aortic valve was not crossed. She was found to have severe stenosis in the ostium of the left main extending into the LAD. I elected to proceed to PCI of the left main (As above, she is not a candidate for redo CABG).   PCI Note: She was given Angiomax weight based bolus and a drip was started. She was given an additional 90 mg Brilinta (taking BID, last dose last night). I then engaged the left main with a JL3.5 guiding catheter. When the ACT was over 200, I passed a Cougar wire down the LAD. I could not pass a balloon over this  wire, likely that it was behind the stent. I then placed a second Cougar IC wire down the LAD. A 2.5 x 8 mm balloon was used to pre-dilate x 2. I could not pass the stent over the wire. I then pre-dilated with a 2.75 x 8 mm [place] balloon x 2. I then deployed a 3.0 x 8 mm Promus Premier DES in the ostium of the left main extending down to the takeoff of the Circumflex. The stent was post-dilated with a 3.5 x 12 mm [place] balloon with the first 5 mm of the balloon extending out into the aorta. The stenosis was taken from 80% down to 0%. Angioseal placed right femoral artery.    There were no immediate complications. The patient was taken to the recovery area in stable condition.   Hemodynamic Findings: Central aortic pressure: 113/49  Angiographic Findings:  Left main: 80% ostial stenosis with 80% restenosis in the proximal edge of the mid stented segment.   Left Anterior Descending Artery: Large caliber vessel that courses to the apex. Patent proximal stented segment without restenosis. There are two small caliber diagonal branches with minimal plaque disease.   Circumflex Artery: Moderate to large caliber vessel with ostial 20% stenosis. Mild plaque in the mid segment.   Right Coronary Artery: Dominant vessel. 100% ostial occlusion (old stents noted in proximal and mid segments). The distal vessel,  PDA and PLA fills from the patent vein graft.   Graft Anatomy:  SVG to distal RCA/PDA is patent  Left Ventricular Angiogram: Deferred.   Impression: 1. Severe CAD involving the left main ostium, left main stented segment. Known occlusion RCA with patent vein graft to RCA 2. Unstable angina 3. Successful PTCA/DES x 1 ostium of the left main artery  Recommendations: Continue ASA and Brilinta for lifetime       Complications:  None. The patient tolerated the procedure well.

## 2014-06-08 ENCOUNTER — Encounter: Payer: Self-pay | Admitting: Cardiology

## 2014-06-08 ENCOUNTER — Encounter (HOSPITAL_COMMUNITY): Payer: Self-pay | Admitting: Physician Assistant

## 2014-06-08 DIAGNOSIS — I5032 Chronic diastolic (congestive) heart failure: Secondary | ICD-10-CM

## 2014-06-08 DIAGNOSIS — I480 Paroxysmal atrial fibrillation: Secondary | ICD-10-CM | POA: Diagnosis present

## 2014-06-08 LAB — POCT ACTIVATED CLOTTING TIME: Activated Clotting Time: 681 seconds

## 2014-06-08 LAB — CBC
HCT: 32.1 % — ABNORMAL LOW (ref 36.0–46.0)
HEMOGLOBIN: 10.1 g/dL — AB (ref 12.0–15.0)
MCH: 28.5 pg (ref 26.0–34.0)
MCHC: 31.5 g/dL (ref 30.0–36.0)
MCV: 90.7 fL (ref 78.0–100.0)
Platelets: 130 10*3/uL — ABNORMAL LOW (ref 150–400)
RBC: 3.54 MIL/uL — AB (ref 3.87–5.11)
RDW: 13.9 % (ref 11.5–15.5)
WBC: 8.3 10*3/uL (ref 4.0–10.5)

## 2014-06-08 LAB — BASIC METABOLIC PANEL
Anion gap: 14 (ref 5–15)
BUN: 24 mg/dL — ABNORMAL HIGH (ref 6–23)
CO2: 23 meq/L (ref 19–32)
CREATININE: 1.06 mg/dL (ref 0.50–1.10)
Calcium: 9.5 mg/dL (ref 8.4–10.5)
Chloride: 104 mEq/L (ref 96–112)
GFR calc Af Amer: 64 mL/min — ABNORMAL LOW (ref >90)
GFR calc non Af Amer: 55 mL/min — ABNORMAL LOW (ref >90)
Glucose, Bld: 120 mg/dL — ABNORMAL HIGH (ref 70–99)
POTASSIUM: 4.2 meq/L (ref 3.7–5.3)
Sodium: 141 mEq/L (ref 137–147)

## 2014-06-08 LAB — POCT I-STAT, CHEM 8
BUN: 24 mg/dL — ABNORMAL HIGH (ref 6–23)
CREATININE: 0.5 mg/dL (ref 0.50–1.10)
Calcium, Ion: 1.13 mmol/L (ref 1.13–1.30)
Chloride: 78 mEq/L — ABNORMAL LOW (ref 96–112)
Glucose, Bld: 68 mg/dL — ABNORMAL LOW (ref 70–99)
HCT: 36 % (ref 36.0–46.0)
Hemoglobin: 12.2 g/dL (ref 12.0–15.0)
POTASSIUM: 3 meq/L — AB (ref 3.7–5.3)
SODIUM: 117 meq/L — AB (ref 137–147)
TCO2: 19 mmol/L (ref 0–100)

## 2014-06-08 LAB — GLUCOSE, CAPILLARY: Glucose-Capillary: 129 mg/dL — ABNORMAL HIGH (ref 70–99)

## 2014-06-08 MED ORDER — INSULIN PUMP
Freq: Three times a day (TID) | SUBCUTANEOUS | Status: DC
  Filled 2014-06-08: qty 1

## 2014-06-08 MED ORDER — ISOSORBIDE MONONITRATE ER 60 MG PO TB24: 90.0000 mg | ORAL_TABLET | Freq: Every day | ORAL | Status: DC

## 2014-06-08 MED FILL — Sodium Chloride IV Soln 0.9%: INTRAVENOUS | Qty: 50 | Status: AC

## 2014-06-08 NOTE — Progress Notes (Signed)
Inpatient Diabetes Program Recommendations  AACE/ADA: New Consensus Statement on Inpatient Glycemic Control (2013)  Target Ranges:  Prepandial:   less than 140 mg/dL      Peak postprandial:   less than 180 mg/dL (1-2 hours)      Critically ill patients:  140 - 180 mg/dL   Inpatient Diabetes Program Recommendations Correction (SSI): DC current SSI order bc pt is using pump Insulin Pump Order-Set needs to be entered.  This coordinator spoke with Dr. Allyson Sabal and she entered order-set and DC'd SSI. Will follow. Thank you  Raoul Pitch BSN, RN,CDE Inpatient Diabetes Coordinator [phone] (team pager)

## 2014-06-08 NOTE — Discharge Summary (Signed)
Discharge Summary   Patient ID: [name] [name] MRN: [id number], DOB/AGE: 07/29/1950 63 y.o. Admit date: 06/06/2014 D/C date:     06/08/2014  Primary Care Provider: Leonides Grills, MD Primary Cardiologist: Dr. Aundra Dubin  Primary Discharge Diagnoses:  1. CAD with unstable angina this admission - history of SVG-RCA in 12/05 with redo SVG-RCA 07/2009; distal left main/proximal LAD severe stenosis in 9/14 - PCI with DES to distal LM/proximal LAD was done rather than bypass as she would have been a poor candidate for redo sternotomy (2 prior sternotomies and history of chest wall radiation for Hodgkins disease).  - this admission: LHC showed 80% ostial LM stenosis (before the prior stent), s/p DES to proximal LM - she will need to be on Brilinta and ASA (poor responder to plavix) long-term, complicated by suspected slow GI bleed 2. Bioprosthetic aortic valve replacement  - history - bicuspid aortic valve; mean gradient of 20 mmHg in 2/10; s/p bioprosth AVR (19-mm Edwards pericardial valve) with a redo coronary artery bypass graft procedure in 07/2009; postoperative Dressler's syndrome 3. Chronic diastolic CHF 4. 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 Hyperlipidemia 6. Anemia suspected due to slow GI bleed, unremarkable EGD and capsule endoscopy in fall 2014 7. Paroxymsal atrial fibrillation - h/o brief episodes noted on ppm interrogation - given GI bleeding and need to be on both ASA 81 and Brilinta, risks likely outweigh benefits of anticoagulation.  8. Hypertension  Secondary Discharge Diagnoses:  . Small bowel obstruction     Recurrent; resolved after resection of a lipoma  . Osteopenia     hip on DEXA in October 2007.  Marland Kitchen Carcinoma, renal cell 05/2007    Laparoscopic right nephrectomy  . NASH (nonalcoholic steatohepatitis) 1999    -biopsy in 1999  . Diabetes mellitus 03/2010    TYPE II: Hemoglobin A1c of 7.4 in 03/2010; 8.4 in  06/2010; treated with insulin pump  . Migraines   . Duodenal ulcer     Remote; H. pylori positive  . Colitis, ischemic 2008  . Gastroesophageal reflux disease     ? of GASTROPARESIS; IRRITABLE BOWEL SYNDROME  . Hodgkin's disease 1991    Mantle radiation therapy  . Statin intolerance   . Hypercalcemia     Ca-11.1 in 07/2011  . Vitamin D deficiency   . Costochondritis   . Insomnia   . Iron deficiency anemia     a. 9.2014 EGD: essentially normal.  . Gastroparesis due to DM 05/01/2012  . PONV (postoperative nausea and vomiting)   . Hyperplastic gastric polyp 06/23/2012  . Arthritis   . Osteoporosis   . Pacemakerfor recurrent syncope/pauses 08/21/2012    Medtronic Adapta L dual-chamber pacemaker-Dr. Lovena Le  . Anxiety    Hospital Course: [name] is a complex 63 y/o F with history of CAD s/p CABG in 12/05 (SVG to RCA due to recurrent RCA ISR) and redo in 1/11 (SVG to RCA) with PCI of a LM into LAD lesion (9/14, DES from LM to pLAD, was not CABG candidate d/t 2 prior sternotomies and h/o chest radiation), bioprosthetic AVR (61/76), diastolic CHF, renal cell carcinoma s/p right nephrectomy with CKD stage II-III, anemia felt due to slow GI bleed, pAF (off anticoagulation due to need for DAPT and prior GIB) and sick sinus syndrome s/p PPM who presented to [facility] 06/06/2014 with severe CP in the setting of progressive angina over the past month. Historically she used to have an episode of angina every 1-2 months.  Over the past month or so, she reported an increase in frequency to about 5-7x per week, occasionally with 2 episodes per day. Symptoms often were post-prandial.Imdur was increased to 170m in attempts to control angina but this was not tolerated due to hypotension. Her angina had been responsive to nitro spray. She presented with worsening chest pain to the ER. Labs were notable for K 3.8, Cr 1.20, POC  TnI 0.01, plt 141, hct 37.5 (stable). CXR was unremarkable. ECG demonstrated NSR, 1st degree AVB (3067m, inferolateral Q waves. QRS notching in I and aVL. Presentation was concerning for UA, particularly in light of ECG findings suggestive that she may have had an infarction over the past few months. She was admitted for further evaluation. Troponins remained negative. Plan was for observation over the weekend with cath on the following Monday. On 11/15 she did note a new petechial rash on her hands, nonblanching and non-pruiritic. She had a similar petichial rash when she was admitted for a prior valve surgery. This resolved without intervention and the source was not identified. It is unclear what is causing the rash. Platelet count was stable, so it was unlikely thrombocytopenia or HIT is the cause.She has many allergies, so it was felt likely an allergic source. This was conservatively managed. 2D Echo 11/16: EF 55-60%, septal motion showed paradox, AV bioprosthesis was present and functioning normally, mild-mod MR, mod TR, PAP 4241m.   She underwent cardiac cath 06/07/14 with severe CAD involving the left main ostium, left main stented segment; Known occlusion RCA with patent vein graft to RCA. She underwent successful PTCA/DES x1 to ostium of the left main. This morning she is feeling better - she felt tired with walking but no further chest pain. Dr. McLAundra Dubinels she will need to be on Brilinta and ASA (poor responder to plavix) long-term. [facility]. Dr. McLAundra Dubincommends close following of her anemia (sees hematology), plan for transfusion with hemoglobin < 8 as this allows best control of her symptoms. She has not been able to tolerate any higher potency statin than pravastatin 10 mg daily. She is on Zetia. LDL was 106 in 1/15. She still has myalgias on low dose pravastatin.She has been seeing our pharmacy clinic to get started on  PCSK-9 inhibitor. With regard to her atrial fibrillation, the stance is maintained that given GI bleeding and need to be on both ASA 81 and Brilinta, risks likely outweigh benefits of anticoagulation. It does not appear from notes that this was an active issue this admission. Dr. McLAundra Dubind Dr. HarEllyn Hackve both seen and evaluated the patient today and feel she is stable for discharge.  Dr. McLAundra Dubincommends f/u 1-2 weeks with him as well as pharmacy followup for PCSK-9 inhibitor. I have left a message on our office's scheduling inbox with these requests, and our office will call the patient with this appointment.   Discharge Vitals: Blood pressure 133/50, pulse 86, temperature 97.7 F (36.5 C), temperature source Oral, resp. rate 19, height 5' 2"  (1.575 m), weight 125 lb 3.5 oz (56.8 kg), SpO2 98 %.  Labs: Lab Results  Component Value Date   WBC 8.3 06/08/2014   HGB 10.1* 06/08/2014   HCT 32.1* 06/08/2014   MCV 90.7 06/08/2014   PLT 130* 06/08/2014    Recent Labs Lab 06/08/14 0403  NA 141  K 4.2  CL 104  CO2 23  BUN 24*  CREATININE 1.06  CALCIUM 9.5  GLUCOSE  120*    Recent Labs  06/07/14 1004  TROPONINI <0.30   Lab Results  Component Value Date   CHOL 151 06/06/2014   HDL 35* 06/06/2014   LDLCALC 60 06/06/2014   TRIG 279* 06/06/2014     Diagnostic Studies/Procedures   Dg Chest 2 View  06/06/2014   CLINICAL DATA:  Chest pain for 2 weeks, worsened tonight, no relief with nitro spray  EXAM: CHEST  2 VIEW  COMPARISON:  02/04/2013  FINDINGS: There is no focal parenchymal opacity, pleural effusion, or pneumothorax. The heart and mediastinum are stable. There is evidence of prior aortic valve replacement. There is a dual lead pacemaker.  The osseous structures are unremarkable.  IMPRESSION: No active cardiopulmonary disease.   Electronically Signed   By: Kathreen Devoid   On: 06/06/2014 02:25   Cath 06/07/14 Cardiac Catheterization Operative Report  KAYLANA FENSTERMACHER [id number] 11/16/20151:56 PM [name] Grills, MD  Procedure Performed:  1. Selective Coronary Angiography 2. SVG angiography 3. PTCA/DES x 1 ostial left main 4. Angioseal right femoral artery  Operator: Lauree Chandler, MD  Indication: 63 yo female with CAD, prior CABG with SVG to RCA and bioprosthetic valve and then redo CABG with SVG to RCA. Last cath 2014 with severe left main/LAD disease. She was turned down for redo bypass. DES placed left main into LAD. Now readmitted with unstable angina.   Procedure Details: The risks, benefits, complications, treatment options, and expected outcomes were discussed with the patient. The patient and/or family concurred with the proposed plan, giving informed consent. The patient was brought to the cath lab after IV hydration was begun and oral premedication was given. The patient was further sedated with Versed and Fentanyl. The right groin was prepped and draped in the usual manner. Using the modified Seldinger access technique, a 5 French sheath was placed in the right femoral artery. Standard diagnostic catheters were used to perform selective coronary angiography. I engaged the left main with a JL4 catheter. A JR4 was used to non-selectively image the RCA and used to engage the SVG to the distal RCA. The bioprosthetic aortic valve was not crossed. She was found to have severe stenosis in the ostium of the left main extending into the LAD. I elected to proceed to PCI of the left main (As above, she is not a candidate for redo CABG).   PCI Note: She was given Angiomax weight based bolus and a drip was started. She was given an additional 90 mg Brilinta (taking BID, last dose last night). I then engaged the left main with a JL3.5 guiding catheter. When the ACT was over 200, I passed a Cougar wire down the LAD. I could not pass a balloon over this wire, likely that it was behind the stent. I then placed a  second Cougar IC wire down the LAD. A 2.5 x 8 mm balloon was used to pre-dilate x 2. I could not pass the stent over the wire. I then pre-dilated with a 2.75 x 8 mm [name] balloon x 2. I then deployed a 3.0 x 8 mm Promus Premier DES in the ostium of the left main extending down to the takeoff of the Circumflex. The stent was post-dilated with a 3.5 x 12 mm [place] balloon with the first 5 mm of the balloon extending out into the aorta. The stenosis was taken from 80% down to 0%. Angioseal placed right femoral artery.    There were no immediate complications. The patient was taken to  the recovery area in stable condition.   Hemodynamic Findings: Central aortic pressure: 113/49  Angiographic Findings:  Left main: 80% ostial stenosis with 80% restenosis in the proximal edge of the mid stented segment.   Left Anterior Descending Artery: Large caliber vessel that courses to the apex. Patent proximal stented segment without restenosis. There are two small caliber diagonal branches with minimal plaque disease.   Circumflex Artery: Moderate to large caliber vessel with ostial 20% stenosis. Mild plaque in the mid segment.   Right Coronary Artery: Dominant vessel. 100% ostial occlusion (old stents noted in proximal and mid segments). The distal vessel, PDA and PLA fills from the patent vein graft.   Graft Anatomy:  SVG to distal RCA/PDA is patent  Left Ventricular Angiogram: Deferred.   Impression: 1. Severe CAD involving the left main ostium, left main stented segment. Known occlusion RCA with patent vein graft to RCA 2. Unstable angina 3. Successful PTCA/DES x 1 ostium of the left main artery  Recommendations: Continue ASA and Brilinta for lifetime   Complications: None. The patient tolerated the procedure well.    2D echo 06/07/14 Study Conclusions - Left ventricle: The cavity size was normal. Wall thickness was normal. Systolic function was normal. The estimated  ejection fraction was in the range of 55% to 60%. Wall motion was normal; there were no regional wall motion abnormalities. The study is not technically sufficient to allow evaluation of LV diastolic function. - Ventricular septum: Septal motion showed paradox. - Aortic valve: A bioprosthesis was present and functioning normally. - Mitral valve: Calcified annulus. Mildly thickened leaflets . There was mild to moderate regurgitation directed centrally. - Right ventricle: Systolic function was moderately reduced. - Tricuspid valve: There was moderate regurgitation. - Pulmonary arteries: Systolic pressure was mildly increased. PA peak pressure: 42 mm Hg (S).    Discharge Medications   Current Discharge Medication List    CONTINUE these medications which have CHANGED   Details  isosorbide mononitrate (IMDUR) 60 MG 24 hr tablet Take 1.5 tablets (90 mg total) by mouth daily. Qty: 45 tablet, Refills: 3      CONTINUE these medications which have NOT CHANGED   Details  ALPRAZolam (XANAX) 0.5 MG tablet Take 0.25-0.5 mg by mouth 2 (two) times daily as needed for anxiety.     aspirin EC 81 MG tablet Take 81 mg by mouth at bedtime.    azithromycin (ZITHROMAX) 500 MG tablet 1 tablet by mouth one hour prior to dental appointment/procedure     BRILINTA 90 MG TABS tablet TAKE ONE TABLET TWICE DAILY     Calcium Carbonate-Vitamin D (RA CALCIUM PLUS VITAMIN D) 600-400 MG-UNIT per tablet Take 1 tablet by mouth 2 (two) times daily.     cholecalciferol (VITAMIN D) 1000 UNITS tablet Take 1,000 Units by mouth daily.     clobetasol (TEMOVATE) 0.05 % external solution Apply 1 application topically daily as needed (dermatosis).    desoximetasone (TOPICORT) 0.05 % cream Apply 1 application topically 2 (two) times daily as needed. When skin starts peeling    docusate sodium (COLACE) 100 MG capsule Take 1 capsule (100 mg total) by mouth 2 (two) times daily.     fenofibrate  (TRICOR) 145 MG tablet TAKE ONE (1) TABLET EACH DAY     ferrous fumarate (HEMOCYTE - 106 MG FE) 325 (106 FE) MG TABS tablet Take 1 tablet (106 mg of iron total) by mouth 2 (two) times daily. Or as directed. Take with Vitamin -C    Associated  Diagnoses: Anemia, normocytic normochromic    fluticasone (FLONASE) 50 MCG/ACT nasal spray Place 2 sprays into the nose daily as needed for rhinitis. For nasal congestion    furosemide (LASIX) 40 MG tablet TAKE TWO TABLETS ONCE DAILY     glucagon (GLUCAGON EMERGENCY) 1 MG injection Inject 1 mg into the vein once as needed (for severe reaction).    Insulin Human (INSULIN PUMP) 100 unit/ml SOLN Inject into the skin continuous. Apidra Basal Rate midnight-400 : 0.3 400-700: 0.25 700-830: 2.5 830- 1230: 3.0 1230-1900: 2.5 1900-2400: 3.2    Magnesium 250 MG TABS Take 250 mg by mouth daily.     metoprolol (LOPRESSOR) 50 MG tablet Take 75 mg by mouth 2 (two) times daily.     Multiple Vitamins-Minerals (MULTIVITAMIN WITH MINERALS) tablet Take 1 tablet by mouth daily.      nitroGLYCERIN (NITROLINGUAL) 0.4 MG/SPRAY spray Place 1 spray under the tongue every 5 (five) minutes as needed for chest pain.     omeprazole (PRILOSEC OTC) 20 MG tablet Take 20 mg by mouth daily.    polyethylene glycol (MIRALAX / GLYCOLAX) packet Take 8.5 g by mouth daily as needed. For constipation    potassium chloride SA (K-DUR,KLOR-CON) 20 MEQ tablet TAKE TWO (2) TABLETS BY MOUTH DAILY     pravastatin (PRAVACHOL) 10 MG tablet TAKE ONE (1) TABLET BY MOUTH EVERY DAY     vitamin C (ASCORBIC ACID) 500 MG tablet Take 250 mg by mouth daily.     [place] Dextrin (BENEFIBER PO) Take 45 mLs by mouth every morning. 3 tablespoons each morning    ZETIA 10 MG tablet TAKE ONE TABLET BY MOUTH EVERY NIGHT AT BEDTIME       STOP taking these medications     aminocaproic acid (AMICAR) 25 % solution         Disposition   The patient will be discharged in stable condition to  home. Discharge Instructions    Amb Referral to Cardiac Rehabilitation    Complete by:  As directed      Diet - low sodium heart healthy    Complete by:  As directed      Increase activity slowly    Complete by:  As directed   No driving for 2 days. No lifting over 5 lbs for 1 week. No sexual activity for 1 week. You may return to work on Monday 44/31/54 (if applicable). Keep procedure site clean & dry. If you notice increased pain, swelling, bleeding or pus, call/return!  You may shower, but no soaking baths/hot tubs/pools for 1 week.          Follow-up Information    Follow up with [name], MD.   Specialty:  Cardiology   Why:  Office will call you for your followup appointment. Call office if you have not heard back in 3 days. We have also requested a followup for you with our pharmacist to discuss the PCSK-9 cholesterol medication.   Contact information:   [address] [place] [address] [phone]       Follow up with Hematology.   Why:  Dr. Aundra Dubin recommends you follow-up closely with hematology to monitor your blood counts.        Duration of Discharge Encounter: Greater than 30 minutes including physician and PA time.  Signed, Melina Copa PA-C 06/08/2014, 9:19 AM

## 2014-06-08 NOTE — Progress Notes (Signed)
CARDIAC REHAB PHASE I   PRE:  Rate/Rhythm: 87 SR  BP:  Supine:   Sitting: 133/50  Standing:    SaO2:   MODE:  Ambulation: 400 ft   POST:  Rate/Rhythm: 90 SR  BP:  Supine:   Sitting: 134/59  Standing:    SaO2:  0810-0905 Pt tolerated ambulation well without c/o of cp or SOB. She does c/o of soreness in her chest and around the side of her chest wall and especially with taking a deep breath. VS stable Pt back to bed after walk with call light in reach and husband present. Completed stent education with pt. She has had multiple stents in the past, so reviewed information with her and her husband. They voice understanding. Pt agrees to [place]. CRP in [place], will send referral.  Rodney Langton RN 06/08/2014 9:08 AM

## 2014-06-08 NOTE — Progress Notes (Signed)
Patient ID: [name] [name], female   DOB: Feb 26, 1951, 63 y.o.   MRN: [id number] Patient ID: [name] [name], female   DOB: [birth year], 63 y.o.   MRN: [id number]   SUBJECTIVE: Tired walking this morning.  Otherwise feels ok, no chest pain.   Echo: EF 55-60%, mild-moderate MR, moderate TR, bioprosthetic aortic valve looks ok.   Cath: 80% ostial LM with DES placement.    Scheduled Meds: . aspirin EC  81 mg Oral QHS  . calcium-vitamin D  1 tablet Oral BID WC  . cholecalciferol  1,000 Units Oral Daily  . docusate sodium  100 mg Oral BID  . ezetimibe  10 mg Oral Daily  . fenofibrate  160 mg Oral Daily  . ferrous fumarate  1 tablet Oral BID  . furosemide  40 mg Oral Daily  . insulin regular  0-9 Units Subcutaneous TID WC  . isosorbide mononitrate  90 mg Oral Daily  . magnesium oxide  200 mg Oral Daily  . metoprolol  75 mg Oral BID  . multivitamin with minerals  1 tablet Oral Daily  . pantoprazole  40 mg Oral Daily  . potassium chloride SA  20 mEq Oral Daily  . pravastatin  10 mg Oral q1800  . ticagrelor  90 mg Oral BID  . triamcinolone   Topical BID  . vitamin C  250 mg Oral Daily   Continuous Infusions:   PRN Meds:.acetaminophen, ALPRAZolam, clobetasol cream, fluticasone, nitroGLYCERIN, ondansetron (ZOFRAN) IV, polyethylene glycol    Filed Vitals:   06/07/14 1700 06/07/14 2028 06/08/14 0011 06/08/14 0416  BP: 127/65 127/40 110/50 119/36  Pulse: 87 91 84 79  Temp: 97.7 F (36.5 C) 98 F (36.7 C) 98.6 F (37 C) 98 F (36.7 C)  TempSrc: Oral Oral Oral Oral  Resp: 18 18 20 20   Height:      Weight:   125 lb 3.5 oz (56.8 kg)   SpO2: 97% 96% 93% 92%    Intake/Output Summary (Last 24 hours) at 06/08/14 0738 Last data filed at 06/07/14 2200  Gross per 24 hour  Intake  802.5 ml  Output    400 ml  Net  402.5 ml    LABS: Basic Metabolic Panel:  Recent Labs  06/07/14 0330 06/08/14 0403  NA 143 141  K 3.9 4.2  CL 104 104  CO2 25 23  GLUCOSE 185* 120*  BUN 29* 24*   CREATININE 1.18* 1.06  CALCIUM 9.8 9.5   Liver Function Tests: No results for input(s): AST, ALT, ALKPHOS, BILITOT, PROT, ALBUMIN in the last 72 hours. No results for input(s): LIPASE, AMYLASE in the last 72 hours. CBC:  Recent Labs  06/06/14 0133  06/07/14 0330 06/08/14 0403  WBC 8.9  < > 7.3 8.3  NEUTROABS 6.3  --   --   --   HGB 12.2  < > 11.1* 10.1*  HCT 37.5  < > 34.6* 32.1*  MCV 91.9  < > 92.5 90.7  PLT 141*  < > 136* 130*  < > = values in this interval not displayed. Cardiac Enzymes:  Recent Labs  06/07/14 1004  TROPONINI <0.30   BNP: Invalid input(s): POCBNP D-Dimer: No results for input(s): DDIMER in the last 72 hours. Hemoglobin A1C:  Recent Labs  06/06/14 0800  HGBA1C 7.9*  7.9*   Fasting Lipid Panel:  Recent Labs  06/06/14 0800  CHOL 151  HDL 35*  LDLCALC 60  TRIG 279*  CHOLHDL 4.3   Thyroid Function Tests:  No results for input(s): TSH, T4TOTAL, T3FREE, THYROIDAB in the last 72 hours.  Invalid input(s): FREET3 Anemia Panel: No results for input(s): VITAMINB12, FOLATE, FERRITIN, TIBC, IRON, RETICCTPCT in the last 72 hours.  RADIOLOGY: Dg Chest 2 View  06/06/2014   CLINICAL DATA:  Chest pain for 2 weeks, worsened tonight, no relief with nitro spray  EXAM: CHEST  2 VIEW  COMPARISON:  02/04/2013  FINDINGS: There is no focal parenchymal opacity, pleural effusion, or pneumothorax. The heart and mediastinum are stable. There is evidence of prior aortic valve replacement. There is a dual lead pacemaker.  The osseous structures are unremarkable.  IMPRESSION: No active cardiopulmonary disease.   Electronically Signed   By: Kathreen Devoid   On: 06/06/2014 02:25    PHYSICAL EXAM General: NAD Neck: JVP 8 cm, no thyromegaly or thyroid nodule.  Lungs: Clear to auscultation bilaterally with normal respiratory effort. CV: Nondisplaced PMI.  Heart regular S1/S2, no S3/S4, 2/6 early SEM RUSB.  No peripheral edema.   Abdomen: Soft, nontender, no  hepatosplenomegaly, no distention.  Neurologic: Alert and oriented x 3.  Psych: Normal affect. Extremities: No clubbing or cyanosis.   TELEMETRY: Reviewed telemetry pt in NSR  ASSESSMENT AND PLAN: 63 yo with history of CAD, diastolic CHF, insulin-dependent diabetes presented with unstable angina.  1. CAD: s/p SVG-RCA in 12/05, then redo SVG-RCA in 1/11. She was found to have distal left main/proximal LAD severe stenosis in 9/14. She was very symptomatic. PCI with DES to distal LM/proximal LAD was done rather than bypass as she would have been a poor candidate for redo sternotomy (2 prior sternotomies and history of chest wall radiation for Hodgkins disease). She will need to be on Brilinta and ASA (poor responder to plavix) long-term. This is complicated by suspected slow GI bleed. She was admitted over weekend with symptoms concerning for unstable angina.  LHC yesterday showed 80% ostial LM stenosis (before the prior stent).  She had DES to proximal LM yesterday without complication.  Doing well this morning.   - Continue ASA 81, statin, metoprolol, Brilinta, Imdur.  - She will need cardiac rehab at [facility] ([place]).  2. Bioprosthetic aortic valve replacement: Stable on yesterday's echo. 3. Diastolic CHF: Echo yesterday with preserved EF.  Probably mild volume overload.  Restarting her home Lasix this morning.  Close followup in office.  4. CKD: Patient has a single kidney s/p right nephrectomy. Renal function stable post-cath.  5. Hyperlipidemia: She has not been able to tolerate any higher potency statin than pravastatin 10 mg daily. She is on Zetia. LDL was 106 in 1/15. She still has myalgias on low dose pravastatin. She is seeing our pharmacy clinic to get started on PCSK-9 inhibitor.   6. Anemia: Suspected slow GI bleed. Unremarkable EGD and capsule endoscopy in fall 2014. Will need to stay on Brilinta + ASA 81. Will need to follow closely (sees hematology), plan for transfusion with  hemoglobin < 8 as this allows best control of her symptoms. CBC today is ok. 7. Atrial fibrillation: Paroxysmal, brief episodes noted on PCM interrogation. Given GI bleeding and need to be on both ASA 81 and Brilinta, risks likely outweigh benefits of anticoagulation.  8. Disposition: Home today, followup with me in 1-2 weeks. Cardiac rehab at [facility].  No change to meds compared to prior to admission but will need followup with pharmacy clinic for PCSK-9 inhibitor.   [name] 06/08/2014 7:38 AM

## 2014-06-08 NOTE — Care Management Note (Signed)
    Page 1 of 1   06/08/2014     11:12:42 AM CARE MANAGEMENT NOTE 06/08/2014  Patient:  [name] [name], [name] [name]   Account Number:  [id number]  Date Initiated:  06/08/2014  Documentation initiated by:  Auriella Wieand  Subjective/Objective Assessment:   Pt s/p PTCA/DES to Lt main on 05/28/14.  PTA, pt independent of ADLS.     Action/Plan:   Pt discharging on Brilinta, and was on prior to admission. Coverage below.   Anticipated DC Date:  06/08/2014   Anticipated DC Plan:  [place]  CM consult  Medication Assistance      Choice offered to / List presented to:             Status of service:  Completed, signed off Medicare Important Message given?   (If response is "NO", the following Medicare IM given date fields will be blank) Date Medicare IM given:   Medicare IM given by:   Date Additional Medicare IM given:   Additional Medicare IM given by:    Discharge Disposition:  HOME/SELF CARE  Per UR Regulation:  Reviewed for med. necessity/level of care/duration of stay  If discussed at [place] of Stay Meetings, dates discussed:    Comments:  06/08/14 Ellan Lambert, RN, BSN [phone]  PT COPAY WILL BE $60- [place] IS NOT REQUIRED.Marland KitchenMarland KitchenTHIS MEDICATION IS PREFERRED THAT IS WHY IT IS A LITTLE PRICEY, BUT THEY DID NOT HAVE AN ALTERNATIVE  Pt has Brilinta copay card, and has been receiving med for $18/month.  Pt given free trial card and additional copay card, if needed for renewal. Pt states she may start on Ranexa soon.  Copay card available for this med...enrolled pt in copay assist, with her permission.  Temporary card given to pt--hard copy card to be mailed to pt in 7-10 days.  She is appreciative of help.

## 2014-06-09 ENCOUNTER — Telehealth: Payer: Self-pay | Admitting: Oncology

## 2014-06-09 ENCOUNTER — Telehealth: Payer: Self-pay

## 2014-06-09 ENCOUNTER — Other Ambulatory Visit: Payer: Self-pay | Admitting: Oncology

## 2014-06-09 DIAGNOSIS — D509 Iron deficiency anemia, unspecified: Secondary | ICD-10-CM

## 2014-06-09 NOTE — Telephone Encounter (Signed)
s.w. pt and advised on nov appt pt ok adn aware

## 2014-06-09 NOTE — Telephone Encounter (Signed)
Told [name] [name] that Dr. Marko Plume said that her discharge instructions on 06-08-14 stated that she take the Ferrous fumarate bid.  If she can tolerate bid, she can continue that dose. Dr. Marko Plume wants to see her 06-21-14 to follow up her hemoglobin.  [name] [name] will continue the iron twice a day.  She has appointment scheduled for 06-21-14 at 1230.

## 2014-06-11 ENCOUNTER — Other Ambulatory Visit: Payer: Self-pay | Admitting: Cardiology

## 2014-06-14 ENCOUNTER — Ambulatory Visit: Payer: BC Managed Care – PPO | Admitting: Nurse Practitioner

## 2014-06-15 ENCOUNTER — Other Ambulatory Visit: Payer: Self-pay | Admitting: *Deleted

## 2014-06-18 ENCOUNTER — Other Ambulatory Visit: Payer: Self-pay | Admitting: Oncology

## 2014-06-18 DIAGNOSIS — D509 Iron deficiency anemia, unspecified: Secondary | ICD-10-CM

## 2014-06-19 ENCOUNTER — Other Ambulatory Visit: Payer: Self-pay | Admitting: Internal Medicine

## 2014-06-21 ENCOUNTER — Ambulatory Visit (HOSPITAL_BASED_OUTPATIENT_CLINIC_OR_DEPARTMENT_OTHER): Payer: BC Managed Care – PPO | Admitting: Oncology

## 2014-06-21 ENCOUNTER — Encounter: Payer: Self-pay | Admitting: Oncology

## 2014-06-21 ENCOUNTER — Telehealth: Payer: Self-pay | Admitting: Cardiology

## 2014-06-21 ENCOUNTER — Other Ambulatory Visit (HOSPITAL_BASED_OUTPATIENT_CLINIC_OR_DEPARTMENT_OTHER): Payer: BC Managed Care – PPO

## 2014-06-21 VITALS — BP 120/44 | HR 75 | Temp 98.2°F | Resp 20 | Ht 62.0 in | Wt 124.8 lb

## 2014-06-21 DIAGNOSIS — D509 Iron deficiency anemia, unspecified: Secondary | ICD-10-CM

## 2014-06-21 DIAGNOSIS — T66XXXD Radiation sickness, unspecified, subsequent encounter: Secondary | ICD-10-CM

## 2014-06-21 DIAGNOSIS — M79651 Pain in right thigh: Secondary | ICD-10-CM

## 2014-06-21 DIAGNOSIS — D500 Iron deficiency anemia secondary to blood loss (chronic): Secondary | ICD-10-CM

## 2014-06-21 DIAGNOSIS — Z8571 Personal history of Hodgkin lymphoma: Secondary | ICD-10-CM

## 2014-06-21 LAB — CBC WITH DIFFERENTIAL/PLATELET
BASO%: 0.8 % (ref 0.0–2.0)
Basophils Absolute: 0.1 10*3/uL (ref 0.0–0.1)
EOS ABS: 0.2 10*3/uL (ref 0.0–0.5)
EOS%: 1.7 % (ref 0.0–7.0)
HEMATOCRIT: 37.4 % (ref 34.8–46.6)
HGB: 12 g/dL (ref 11.6–15.9)
LYMPH%: 15.1 % (ref 14.0–49.7)
MCH: 29.3 pg (ref 25.1–34.0)
MCHC: 32 g/dL (ref 31.5–36.0)
MCV: 91.5 fL (ref 79.5–101.0)
MONO#: 0.5 10*3/uL (ref 0.1–0.9)
MONO%: 5.6 % (ref 0.0–14.0)
NEUT%: 76.8 % (ref 38.4–76.8)
NEUTROS ABS: 6.8 10*3/uL — AB (ref 1.5–6.5)
PLATELETS: 192 10*3/uL (ref 145–400)
RBC: 4.09 10*6/uL (ref 3.70–5.45)
RDW: 14.2 % (ref 11.2–14.5)
WBC: 8.9 10*3/uL (ref 3.9–10.3)
lymph#: 1.3 10*3/uL (ref 0.9–3.3)

## 2014-06-21 LAB — IRON AND TIBC CHCC
%SAT: 16 % — AB (ref 21–57)
Iron: 66 ug/dL (ref 41–142)
TIBC: 399 ug/dL (ref 236–444)
UIBC: 333 ug/dL (ref 120–384)

## 2014-06-21 NOTE — Telephone Encounter (Signed)
New message      [name] want to put her on a medication but has questions about her liver disease.  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  If you need more info, call liver doctor---Dr Gerald Dexter at [phone].

## 2014-06-21 NOTE — Progress Notes (Signed)
OFFICE PROGRESS NOTE   06/21/2014   Physicians:W.McGough, [name] (endocrine [facility]), [name], D.McLean, G.Lovena Le, B.Bartle, A.Deal (GI [facility]  INTERVAL HISTORY:  Patient is seen, together with husband, earlier than scheduled visit due to lower hemoglobin during recent hospitalization with unstable angina. She is followed at this office for iron deficiency anemia related to slow GI blood loss, complicated by requirement for indefinite Brilinta and ASA. Last IV feraheme was 05-2013 and she has not been transfused PRBCs since 04-2013.  Patient also has remote history of Hodgkins treated with mantle radiation in 1980s, and right nephrectomy for renal cell carcinoma 2008.  Hemoglobin was 12.7 at [facility] on 05-05-14, 11.9 at [facility] admission 06-06-14, and down to 10.1 by 06-08-14. She increased po ferrous fumarate to bid after recent hospitalization, taking this ~ an hour after meals and tolerating without problems. She has not seen frank bleeding, stools are dark with the iron.   Patient was hospitalized at [facility] 11-15 thru 06-08-14 with unstable angina, with PTCA/ DES ostial left main lesion by Dr Angelena Form on 06-07-14. She has had no angina since that procedure.   Only other different problem is burning pain outer aspect of right thigh x 2-3 days, occurring with more brisk walking, sitting in car and tenderness to touch in same area. She has some slight low back pain which is not clearly associated. She has had no swelling of LE and no rash.  She does not have PAC Flu vaccine done  This was the first time that I have met her husband, tho I have followed patient since Dr Terrall Laity retired.  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 She has radiation induced hypothyroidism and the treatment caused or worsened coronary artery disease. She has had no further active Hodgkins.   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 She is no longer followed regularly by urology.  Significant comorbid conditions include ACD with prior CABG then redo CABG, aortic stenosis post aortic valve 2011, SSS with pacer, insulin dependent diabetes now followed by Dr [name] at [facility] and on insulin pump, hypothyroidism related to RT for Hodgkins, diagnosis of ischemic colitis with rectal bleeding during past year, and some renal insufficiency with single kidney.Patient was admitted to [facility] from 04-03-13 thru 04-11-13 with progressive dyspnea, orthopnea, unstable angina and Hgb of 7.3 on day of admission, this following outpatient evaluation with cardiopulmonary exercise test with evidence of ischemia. She ruled out for MI. Stool was heme + by GI in hospital; she was transfused 1 unit of PRBCs on 04-03-13, given over 4 hours and note from those MDs that she should have lasix with further transfusions. She began oral iron, did not tolerate this bid.. EGD 04-06-13 was unremarkable including duodenal biopsy. She had cardiac cath on 04-07-13 with high grade LM and proximal LAD involvement, and diffuse calcific plaque in aorta. CVTS (Dr Cyndia Bent) felt that she was not a candidate for a 3rd re-do surgical procedure particularly due to diffuse aortic calcification. She had PTCA of left main with stent into ostium of LM into LAD by Dr Angelena Form on 04-09-13; per cardiology she will need lifelong dual antiplatelet therapy. She had PTCA/ DES ostial left main by Dr Angelena Form  again 06-07-14.  Review of systems as above, also: No fever or symptoms of infection. No other different pain. Not SOB now. As always, follows blood sugars extremely closely on insulin pump Remainder of 10 point Review  of Systems negative.  Objective:  Vital signs in last 24 hours:  BP 120/44 mmHg  Pulse 75  Temp(Src) 98.2 F (36.8 C) (Oral)  Resp 20  Ht _0  (1.575 m)  Wt 124 lb 12.8 oz (56.609 kg)  BMI 22.82 kg/m2  Alert, oriented and appropriate. Ambulatory without difficulty.  No alopecia  HEENT:PERRL, sclerae not icteric. Oral mucosa moist without lesions, posterior pharynx clear.  Neck supple. No JVD.  Lymphatics:no cervical,suraclavicular adenopathy Resp: clear to auscultation bilaterally  Cardio: regular rate and rhythm. No gallop. GI: soft, nontender, not distended Musculoskeletal/ Extremities: without pitting edema, or cords. Slightly tender along muscles lateral right thigh. No tenderness to palpation mid to low back. Neuro: speech fluent, no focal deficits. PSYCH appropriate mood and affect Skin without rash including RLE and low back,   Lab Results:  Results for orders placed or performed in visit on 06/21/14  CBC with Differential  Result Value Ref Range   WBC 8.9 3.9 - 10.3 10e3/uL   NEUT# 6.8 (H) 1.5 - 6.5 10e3/uL   HGB 12.0 11.6 - 15.9 g/dL   HCT 37.4 34.8 - 46.6 %   Platelets 192 145 - 400 10e3/uL   MCV 91.5 79.5 - 101.0 fL   MCH 29.3 25.1 - 34.0 pg   MCHC 32.0 31.5 - 36.0 g/dL   RBC 4.09 3.70 - 5.45 10e6/uL   RDW 14.2 11.2 - 14.5 %   lymph# 1.3 0.9 - 3.3 10e3/uL   MONO# 0.5 0.1 - 0.9 10e3/uL   Eosinophils Absolute 0.2 0.0 - 0.5 10e3/uL   Basophils Absolute 0.1 0.0 - 0.1 10e3/uL   NEUT% 76.8 38.4 - 76.8 %   LYMPH% 15.1 14.0 - 49.7 %   MONO% 5.6 0.0 - 14.0 %   EOS% 1.7 0.0 - 7.0 %   BASO% 0.8 0.0 - 2.0 %    Iron studies available after visit have serum iron 66 and %sat 16. We will let patient know by phone.  Studies/Results:  EXAM: CHEST 2 VIEW 06-06-14 COMPARISON: 02/04/2013  FINDINGS: There is no focal parenchymal opacity, pleural effusion, or pneumothorax. The heart and mediastinum are stable. There is evidence of prior aortic valve  replacement. There is a dual lead pacemaker.  The osseous structures are unremarkable.  IMPRESSION: No active cardiopulmonary disease.   She had bilateral mammograms at [facility] 02-22-14, then follow up left mammogram 02-25-14 due to motion, no findings of concern on the final. Those reports scanned into this EMR under imaging.  Medications: I have reviewed the patient's current medications. She is tolerating oral iron bid, taking this ~ an hour after meals. She has had shingles vaccine.  DISCUSSION: hemoglobin back in good range today, may have been lower with IVF in hospital. As she is tolerating bid iron now, and with difficulty with gastroparesis and absorption, she will continue bid if tolerates, otherwise daily. If she has had cbc checked by other MD and is stable around time of next scheduled appointment here, she can let us know and we can move the appointment out.  Discussed findings that would be of concern for zoster involving left lateral thigh, patient to look at area 2-3x daily. Zoster vaccine may not be 100% effective in preventing zoster. Discussed possible referred pain from L5.  Assessment/Plan:  1.intermittent anemia related to intermittent GI bleeding in patient on necessary long term brilinta and ASA for CAD. Hemoglobin good today. Follow up as above.   Note needs lasix if PRBCs. 2.IIA nodular sclerosing Hodgkins treated with mantle radiation in 1980s.  3.post right nephrectomy for renal cell carcinoma 2008, not known recurrent. Last UA without blood.  4.Cardiac disease: CAD post RCA vein graft and redo CABG 2011, aortic stenosis post aortic valve 2011, sinus node dysfunction with brief episodes PAF, dual chamber pacer Jan 2014. Post stent to LM into proximal LAD for progressive involvement 04-2013 and PTCA/ DES LM ostium 05-2014, Not candidate for another surgery per CVTS. Angina resolved with recent procedure 5. Insulin dependent diabetes: followed by Dr [name] at  [facility], on insulin pump.  6.hypothyroidism post RT, on replacement, Dr Frederik Pear also following thyroid US  7.rectal bleeding ~ 12-2012 thought from ischemic colitis. Post colonoscopy 06-2012 with no polyps.  8.renal insufficiency: single kidney, creatinines 1.1 in 10-2013 9.gastroparesis related to DM: symptoms much better on liquid diet, continuing treatment at [facility]. 10. Amicar mouthwash very helpful for gum bleeding after dental procedure; she could try this topically if skin scratches bleed excessively. 11.new burning discomfort lateral right thigh in ~ L5 distribution: possibly L5 disc, seems less likely early zoster but will watch closely. I cannot tell that acyclovir is indicated now. Patient to call MD promptly if rash or worsening. 12.increased risk for breast cancer due to previous mantle radiation. Mammograms [facility] 02-22-14 as above, repeat 1 year.   Patient and husband are pleased with repeat lab today and know to call prior to scheduled visit if needed. Cc this note to her PCP Dr Orson Ape and to Dr Frederik Pear (who is also in [facility]). Time spent 25 min including >50% counseling and coordination of care.  Tosca Pletz P, MD   06/21/2014, 1:05 PM

## 2014-06-21 NOTE — Telephone Encounter (Signed)
Spoke with patient  And advised her that i called Dr. Gerald Dexter at [facility] for her records. They will need a signed med release from her, and I told her that. She will fax an authorization to them for records.

## 2014-06-22 ENCOUNTER — Telehealth: Payer: Self-pay | Admitting: *Deleted

## 2014-06-22 ENCOUNTER — Encounter (HOSPITAL_COMMUNITY): Payer: Self-pay

## 2014-06-22 ENCOUNTER — Encounter (HOSPITAL_COMMUNITY)
Admission: RE | Admit: 2014-06-22 | Discharge: 2014-06-22 | Disposition: A | Discharge status: Home / Self Care | Payer: BC Managed Care – PPO | Source: Ambulatory Visit | Attending: Cardiology | Admitting: Cardiology

## 2014-06-22 DIAGNOSIS — I2510 Atherosclerotic heart disease of native coronary artery without angina pectoris: Secondary | ICD-10-CM | POA: Insufficient documentation

## 2014-06-22 DIAGNOSIS — Z955 Presence of coronary angioplasty implant and graft: Secondary | ICD-10-CM | POA: Insufficient documentation

## 2014-06-22 NOTE — Patient Instructions (Signed)
Pt has finished orientation and is scheduled to start CR on December 3rd at 1100.  Pt has been instructed to arrive to class 15 minutes early for scheduled class. Pt has been instructed to wear comfortable clothing and shoes with rubber soles. Pt has been told to take their medications 1 hour prior to coming to class.  If the patient is not going to attend class, she has been instructed to call.

## 2014-06-22 NOTE — Telephone Encounter (Signed)
Pt notified of message below. Verbalized understanding

## 2014-06-22 NOTE — Progress Notes (Signed)
Patient referred to Cardiac Rehab by Dr Raeanne Gathers due to Stented Coronary Artery (ICD 10 Z95.5).  Dr. Aundra Dubin is her cardiologist and Dr. Hilma Favors is her PCP.  During orientation advised patient on arrival and appointment times what to wear, what to do before, during and after exercise.  Reviewed attendance and class policy.  Talked about inclement weather and class consultation policy. Patient is scheduled to start cardiac Rehab on December 3rd at 1100.  Patient was advised to come to class 5 minutes before class starts.  Pt is a KX modifier and has already take the nutrition part of program and is currently seeing her own nutritionist at this time.  Pt is eager to get started.  Pt able to finish six minute walk test.

## 2014-06-22 NOTE — Telephone Encounter (Signed)
-----   Message from [name], MD sent at 06/21/2014  4:18 PM EST ----- Labs seen and need follow up: please let her know iron is a little lower than in July, but not too bad. OK to take either iron either once daily or twice daily as she is able to tolerate.

## 2014-06-23 ENCOUNTER — Encounter (HOSPITAL_COMMUNITY)
Admission: RE | Admit: 2014-06-23 | Discharge: 2014-06-23 | Disposition: A | Discharge status: Home / Self Care | Payer: BC Managed Care – PPO | Source: Ambulatory Visit | Attending: Cardiology | Admitting: Cardiology

## 2014-06-23 DIAGNOSIS — Z955 Presence of coronary angioplasty implant and graft: Secondary | ICD-10-CM | POA: Diagnosis not present

## 2014-06-23 DIAGNOSIS — I2510 Atherosclerotic heart disease of native coronary artery without angina pectoris: Secondary | ICD-10-CM | POA: Diagnosis present

## 2014-06-25 ENCOUNTER — Encounter (HOSPITAL_COMMUNITY)
Admission: RE | Admit: 2014-06-25 | Discharge: 2014-06-25 | Disposition: A | Discharge status: Home / Self Care | Payer: BC Managed Care – PPO | Source: Ambulatory Visit | Attending: Cardiology | Admitting: Cardiology

## 2014-06-25 ENCOUNTER — Ambulatory Visit (INDEPENDENT_AMBULATORY_CARE_PROVIDER_SITE_OTHER): Payer: BC Managed Care – PPO | Admitting: Cardiology

## 2014-06-25 ENCOUNTER — Encounter: Payer: Self-pay | Admitting: *Deleted

## 2014-06-25 VITALS — BP 110/50 | HR 67 | Ht 63.0 in | Wt 124.5 lb

## 2014-06-25 DIAGNOSIS — I6523 Occlusion and stenosis of bilateral carotid arteries: Secondary | ICD-10-CM

## 2014-06-25 DIAGNOSIS — I495 Sick sinus syndrome: Secondary | ICD-10-CM

## 2014-06-25 DIAGNOSIS — I350 Nonrheumatic aortic (valve) stenosis: Secondary | ICD-10-CM

## 2014-06-25 DIAGNOSIS — I25810 Atherosclerosis of coronary artery bypass graft(s) without angina pectoris: Secondary | ICD-10-CM

## 2014-06-25 DIAGNOSIS — N182 Chronic kidney disease, stage 2 (mild): Secondary | ICD-10-CM

## 2014-06-25 DIAGNOSIS — I2510 Atherosclerotic heart disease of native coronary artery without angina pectoris: Secondary | ICD-10-CM | POA: Diagnosis not present

## 2014-06-25 DIAGNOSIS — I5032 Chronic diastolic (congestive) heart failure: Secondary | ICD-10-CM

## 2014-06-25 NOTE — Patient Instructions (Signed)
Your physician recommends that you have  a FASTING lipid profile--today  Your physician recommends that you schedule a follow-up appointment in: 3 months with [name].  Your physician has requested that you have a carotid duplex. This test is an ultrasound of the carotid arteries in your neck. It looks at blood flow through these arteries that supply the brain with blood. Allow one hour for this exam. There are no restrictions or special instructions. March 2016  Sally Earl,pharmacist will be in touch with you about the new cholesterol medications-PCSK9, Repatha or Praulent.

## 2014-06-26 ENCOUNTER — Encounter: Payer: Self-pay | Admitting: Cardiology

## 2014-06-26 NOTE — Progress Notes (Signed)
Patient ID: [name] [name], female   DOB: Apr 10, 1951, 63 y.o.   MRN: [id number] PCP: Dr. Orson Ape  63 yo with history of CAD s/p CABG in 12/05 and redo in 1/11, bioprosthetic AVR, diastolic CHF, and sick sinus syndrome s/p PCM presents for cardiology followup.  She had initial CABG with SVG-RCA in 12/05 followed by redo SVG-RCA and bioprosthetic AVR in 1/11.  She has had diastolic CHF and is on Lasix.  In 9/14, she had a CPX test.  This showed severe functional limitation with ischemic ECG changes (inferolateral ST depression and ST elevation in V1 and V2). She had chest pain.  I took her for [facility] in 9/14.  This showed elevated left and right heart filling pressures, patent SVG-PDA, and 80% distal LM/80% ostial LAD stenosis.  Patient was deemed not a candidate for redo CABG (had 2 prior sternotomies as well as chest radiation for Hodgkins lymphoma).  She had DES from left main into proximal LAD and will need long-term DAPT => Brilinta was used as she has been a poor responder to Plavix in the past.  She re-developed angina in 11/15 and was admitted with unstable angina.  LHC showed 80% ostial LM and patent SVG-RCA. She had DES to ostial LM. Last echo in 11/15 showed EF 55-60%, normal bioprosthetic aortic valve, mild-moderate MR, moderate TR.  Since that time, she has had no more chest pain. She is doing cardiac rehab at [facility].  No exertional dyspnea unless she is walking up a hill.    She has chronic iron deficiency anemia thought to be due to a slow GI bleed.  Last EGD was unremarkable and a capsule endoscopy was also unremarkable.  She has periodic transfusions; However, recently hemoglobin has been stable.  No BRBPR.   She has an insulin pump followed by an endocrinologist at [facility].   Patient is on a very low dose of pravastatin.  She has been unable to tolerate multiple statins or higher pravastatin dose due to myalgias.  She still has myalgias even with a low dose of pravastatin.   Labs (7/14):  hemoglobin 11 Labs (9/14): K 3.9, creatinine 1.48, hemoglobin 7.3 Labs (10/14): hemoglobin 7.7 Labs (11/14): K 3.8, creatinine 1.5 Labs (12/14): HCT 37.7 Labs (1/15): LDL 106, HDL 41, K 3.9, creatinine 1.6, LFTs normal Labs (2/15): K 3.7, creatinine 1.1, BNP 71 Labs (3/15): HCT 36.7 Labs (4/15): K 3.7, creatinine 1.1, HCT 35.5 Labs (7/15): HCT 36.4 Labs (11/15): HCT 32.1, K 4.2, creatinine 1.06 Labs (12/15): hgb 12  ECG: NSR, 1st degree AV block 360 msec  PMH: 1. Sick sinus syndrome s/p [employer] PCM.  2. HTN 3. H/o SBO 4. Renal cell carcinoma s/p right nephrectomy in 2008.  5. 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  Fibroscan in 11/14 showed advanced fibrosis concerning for cirrhosis. EGD in 11/14 showed no varices.  6. Diastolic CHF: Echo (3/42) with EF 60-65%, bioprosthetic aortic valve with mean gradient 15 mmHg, mild MR.  CPX (9/14): peak VO2 10.4, VE/VCO2 43.8, inferolateral ST depression and ST elevation in V1/V2 with severe dyspnea and chest pain, severe functional limitation with ischemic changes.  Echo (9/14) with EF 60-65%, normal RV size and systolic function, mild-moderate MR, bioprosthetic aortic valve normal, PA systolic pressure 51 mmHg, no evidence for pericardial constriction though exam incomplete for constriction.  Echo (11/15) with EF 55-60%, bioprosthetic aortic valve, mild-moderate MR, moderate TR, PA systolic pressure 42 mmHg.  7. TAH-BSO 8. Type II diabetes: Has insulin pump.  9.  H/o ischemic colitis. 10. H/o PUD. 11. Diabetic gastroparesis. 12. Hyperlipidemia: Myalgias with Crestor and atorvastatin, elevated LFTs with simvastatin.  Myalgias with > 10 mg pravastatin.  13. CKD 14. Bicuspid aortic valve s/p 19 mm Edwards pericardial valve in 1/11 (had post-operative Dresslers syndrome).   15. CAD: CABG with SVG-RCA in 12/05.  Redo SVG-RCA in 1/11 with AVR.  LHC/RHC (9/14) with mean RA 18, PA 62/25 mean 43, mean PCWP 26, CI 2.8, 70-80% distal LM stenosis, 80% ostial  LAD stenosis, total occlusion RCA, SVG-PDA patent.  Patient had DES LM into LAD.  Plan to continue long-term ASA/Brilinta (poor responder to Plavix).  ETT-cardiolite (4/15) with 6' exercise, EF 60%, ST depression in recovery and mild chest pain, no ischemia or infarction by perfusion images.  Unstable angina 11/15 with 80% LM, patent SVG-RCA => DES to ostial LM.   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  17. Anemia: Iron-deficiency.  Suspect chronic GI blood loss.  EGD in 9/14 was unremarkable.  Capsule endoscopy 10/14 was unremarkable.  Has required periodic blood transfusions.  18. Atrial fibrillation: Paroxysmal, noted by [facility] interrogation.  19. Carotid dopplers (2/15) with 40-59% bilateral stenosis.   SH: Married, lives in [place], nonsmoker.   FH: CAD  ROS: All systems reviewed and negative except as per HPI.   Current Outpatient Prescriptions  Medication Sig Dispense Refill  . ALPRAZolam (XANAX) 0.5 MG tablet Take 0.25-0.5 mg by mouth 2 (two) times daily as needed for anxiety.     Marland Kitchen aspirin EC 81 MG tablet Take 81 mg by mouth at bedtime.    Marland Kitchen azithromycin (ZITHROMAX) 500 MG tablet 1 tablet by mouth one hour prior to dental appointment/procedure 2 tablet 0  . BRILINTA 90 MG TABS tablet TAKE ONE TABLET TWICE DAILY 60 tablet 6  . Calcium Carbonate-Vitamin D (RA CALCIUM PLUS VITAMIN D) 600-400 MG-UNIT per tablet Take 1 tablet by mouth 2 (two) times daily.     . cholecalciferol (VITAMIN D) 1000 UNITS tablet Take 1,000 Units by mouth daily.     . clobetasol (TEMOVATE) 0.05 % external solution Apply 1 application topically daily as needed (dermatosis).    Marland Kitchen desoximetasone (TOPICORT) 0.05 % cream Apply 1 application topically 2 (two) times daily as needed. When skin starts peeling    . docusate sodium (COLACE) 100 MG capsule Take 1 capsule (100 mg total) by mouth 2 (two) times daily. 60 capsule 3  . fenofibrate (TRICOR) 145 MG tablet TAKE ONE (1)  TABLET EACH DAY 90 tablet 3  . ferrous fumarate (HEMOCYTE - 106 MG FE) 325 (106 FE) MG TABS tablet Take 1 tablet (106 mg of iron total) by mouth 2 (two) times daily. Or as directed. Take with Vitamin -C (Patient taking differently: Take 1 tablet by mouth 2 (two) times a week. Or as directed. Take with Vitamin -C) 100 each 1  . furosemide (LASIX) 40 MG tablet TAKE TWO TABLETS ONCE DAILY 60 tablet 3  . glucagon (GLUCAGON EMERGENCY) 1 MG injection Inject 1 mg into the vein once as needed (for severe reaction).    . Insulin Human (INSULIN PUMP) 100 unit/ml SOLN Inject into the skin continuous. Apidra Basal Rate midnight-0700 : 0.55 700-830: 2.25 830- 1930: 2.5 1930-midnight: 3.2    . isosorbide mononitrate (IMDUR) 60 MG 24 hr tablet Take 1.5 tablets (90 mg total) by mouth daily. 45 tablet 3  . Magnesium 250 MG TABS Take 250 mg by mouth daily.     Marland Kitchen  metoprolol (LOPRESSOR) 50 MG tablet Take 75 mg by mouth 2 (two) times daily.     . Multiple Vitamins-Minerals (MULTIVITAMIN WITH MINERALS) tablet Take 1 tablet by mouth daily.      . nitroGLYCERIN (NITROLINGUAL) 0.4 MG/SPRAY spray Place 1 spray under the tongue every 5 (five) minutes as needed for chest pain. 12 g 1  . omeprazole (PRILOSEC OTC) 20 MG tablet Take 20 mg by mouth daily.    . ONE TOUCH ULTRA TEST test strip     . polyethylene glycol (MIRALAX / GLYCOLAX) packet Take 8.5 g by mouth daily as needed. For constipation    . potassium chloride SA (K-DUR,KLOR-CON) 20 MEQ tablet TAKE TWO (2) TABLETS BY MOUTH DAILY 60 tablet 1  . pravastatin (PRAVACHOL) 10 MG tablet TAKE ONE (1) TABLET BY MOUTH EVERY DAY 90 tablet 1  . vitamin C (ASCORBIC ACID) 500 MG tablet Take 250 mg by mouth daily.     . Wheat Dextrin (BENEFIBER PO) Take 45 mLs by mouth every morning. 3 tablespoons each morning    . ZETIA 10 MG tablet TAKE ONE TABLET BY MOUTH EVERY NIGHT AT BEDTIME 90 tablet 0  . fluticasone (FLONASE) 50 MCG/ACT nasal spray Place 2 sprays into the nose daily as  needed for rhinitis. For nasal congestion     No current facility-administered medications for this visit.    BP 110/50 mmHg  Pulse 67  Ht 5' 3"  (1.6 m)  Wt 124 lb 8 oz (56.473 kg)  BMI 22.06 kg/m2 General: NAD Neck: JVP 7 cm, no thyromegaly or thyroid nodule.  Lungs: Clear to auscultation bilaterally with normal respiratory effort. CV: Nondisplaced PMI.  Heart regular S1/S2, no X8/P3, 2/6 systolic murmur RUSB.  No peripheral edema.  Left carotid bruit.  2+ PT pulses bilaterally.  Abdomen: Soft, nontender, no hepatosplenomegaly, no distention.  Skin: Intact without lesions or rashes.  Neurologic: Alert and oriented x 3.  Psych: Normal affect. Extremities: No clubbing or cyanosis.   Assessment/Plan: 1. CAD: s/p SVG-RCA in 12/05, then redo SVG-RCA in 1/11.  She was found to have distal left main/proximal LAD severe stenosis in 9/14.  She was very symptomatic.  PCI with DES to distal LM/proximal LAD was done rather than bypass as she would have been a poor candidate for redo sternotomy (2 prior sternotomies and history of chest wall radiation for Hodgkins disease).  Recurrent unstable angina with 80% ostial LM treated with DES.  No chest pain since that time.  She will need to be on Brilinta and ASA (poor responder to plavix) long-term.  This is complicated by suspected slow GI bleed. So far, this has been controlled with periodic transfusions (hemoglobin has been stable recently).   - Continue ASA 81, statin, metoprolol, Brilinta, Imdur without changes.    2. Bioprosthetic aortic valve replacement: Stable on last echo.  3. Diastolic CHF: Euvolemic on exam, EF normal on 11/15 echo. Continue current Lasix. 4. CKD:  Patient has a single kidney s/p right nephrectomy.  She has been referred to nephrology. Creatinine has been stable.  5. Hyperlipidemia: She has not been able to tolerate any higher potency statin than pravastatin 10 mg daily.  She is on Zetia.  She still has myalgias on low dose  pravastatin.  I will check lipids today.  She was considered for a PCSK9-inihibitor study but given recent PCI she will not qualify.  I am going to have her seen in our lipid clinic to consider starting a PCSK9 inhibitor outside of  a study.  6. Anemia: Suspected slow GI bleed.  Unremarkable EGD and capsule endoscopy in fall 2014.  Unfortunately, needs to stay on Brilinta long-term.  Will need to follow closely (sees hematology), plan for transfusion with hemoglobin < 8 as this allows best control of her symptoms. Recent CBC looked good.   7. Atrial fibrillation: Paroxysmal, brief episodes noted on PCM interrogation.  Given GI bleeding and need to be on both ASA 81 and Brilinta, risks likely outweigh benefits of anticoagulation.  8. Carotid bruit: Mild to moderate bilateral stenosis.  Repeat in 1 year (3/16). 9. SSS: Pacemaker in place.    [name] 06/26/2014

## 2014-06-28 ENCOUNTER — Other Ambulatory Visit (INDEPENDENT_AMBULATORY_CARE_PROVIDER_SITE_OTHER): Payer: BC Managed Care – PPO | Admitting: *Deleted

## 2014-06-28 ENCOUNTER — Encounter (HOSPITAL_COMMUNITY)
Admission: RE | Admit: 2014-06-28 | Discharge: 2014-06-28 | Disposition: A | Discharge status: Home / Self Care | Payer: BC Managed Care – PPO | Source: Ambulatory Visit | Attending: Cardiology | Admitting: Cardiology

## 2014-06-28 DIAGNOSIS — I2510 Atherosclerotic heart disease of native coronary artery without angina pectoris: Secondary | ICD-10-CM | POA: Diagnosis not present

## 2014-06-28 DIAGNOSIS — I25810 Atherosclerosis of coronary artery bypass graft(s) without angina pectoris: Secondary | ICD-10-CM

## 2014-06-28 DIAGNOSIS — I6523 Occlusion and stenosis of bilateral carotid arteries: Secondary | ICD-10-CM

## 2014-06-28 LAB — LIPID PANEL
CHOLESTEROL: 152 mg/dL (ref 0–200)
HDL: 37 mg/dL — AB (ref >39.00)
LDL Cholesterol: 83 mg/dL (ref 0–99)
NonHDL: 115
Total CHOL/HDL Ratio: 4
Triglycerides: 158 mg/dL — ABNORMAL HIGH (ref 0.0–149.0)
VLDL: 31.6 mg/dL (ref 0.0–40.0)

## 2014-06-30 ENCOUNTER — Encounter (HOSPITAL_COMMUNITY): Payer: BC Managed Care – PPO

## 2014-06-30 ENCOUNTER — Telehealth: Payer: Self-pay | Admitting: Internal Medicine

## 2014-06-30 NOTE — Telephone Encounter (Signed)
Walk In pt Form" Packet Dropped Off" gave to Sally/KM

## 2014-07-01 ENCOUNTER — Encounter (HOSPITAL_COMMUNITY): Payer: Self-pay | Admitting: Internal Medicine

## 2014-07-01 NOTE — Progress Notes (Signed)
Cardiac Rehabilitation Program Outcomes Report   Orientation:  06/22/14 Graduate Date:  tbd Discharge Date:  tbd # of sessions completed: 3  Cardiologist: Dr. Marigene Ehlers Family MD:  Dr. Bethann Berkshire Time:  1100  A.  Exercise Program:  Tolerates exercise @ 3.29 METS for 15 minutes  B.  Mental Health:  Good mental attitude  C.  Education/Instruction/Skills  Accurately checks own pulse.  Rest:  81  Exercise:  90, Knows THR for exercise and Uses Perceived Exertion Scale and/or Dyspnea Scale    D.  Nutrition/Weight Control/Body Composition:  Adherence to prescribed nutrition program: good    E.  Blood Lipids    Lab Results  Component Value Date   CHOL 152 06/28/2014   HDL 37.00* 06/28/2014   LDLCALC 83 06/28/2014   TRIG 158.0* 06/28/2014   CHOLHDL 4 06/28/2014    F.  Lifestyle Changes:  Making positive lifestyle changes  G.  Symptoms noted with exercise:  Asymptomatic  Report Completed By:  Felicity Coyer, RN   Comments:  First week note

## 2014-07-02 ENCOUNTER — Encounter (HOSPITAL_COMMUNITY): Payer: BC Managed Care – PPO

## 2014-07-05 ENCOUNTER — Other Ambulatory Visit: Payer: Self-pay

## 2014-07-05 ENCOUNTER — Encounter (HOSPITAL_COMMUNITY): Payer: BC Managed Care – PPO

## 2014-07-05 ENCOUNTER — Telehealth: Payer: Self-pay | Admitting: Cardiology

## 2014-07-05 DIAGNOSIS — M79605 Pain in left leg: Secondary | ICD-10-CM

## 2014-07-05 DIAGNOSIS — R0989 Other specified symptoms and signs involving the circulatory and respiratory systems: Secondary | ICD-10-CM

## 2014-07-05 NOTE — Telephone Encounter (Signed)
Patient has been having rt leg pain, thigh to knee, past few weeks. Her PCP Dr. Hilma Favors told her pulses were weak, but  Palpable in LE. He is calling it TENSA FASCIA LATTE. Voltaren gel Rx'd, but hasn't started it yet. She is wondering if she needs ABI's checked.

## 2014-07-05 NOTE — Telephone Encounter (Signed)
New msg   Pt has been to endocrinologist today and had pulse checked in rt leg and was informed it was weak. Pt has been having pain in rt leg. Please contact at [phone].

## 2014-07-05 NOTE — Telephone Encounter (Signed)
ABIs reasonable to do, don't see where she's had them done before.

## 2014-07-05 NOTE — Telephone Encounter (Signed)
Spoke with patient and told her that Dr. Aundra Dubin is OK with ordering ABI's for her. Last one done at [facility] 6 or 7 years ago. Patient advised that schedulers would call her back.

## 2014-07-06 ENCOUNTER — Other Ambulatory Visit: Payer: Self-pay | Admitting: Internal Medicine

## 2014-07-06 ENCOUNTER — Ambulatory Visit (INDEPENDENT_AMBULATORY_CARE_PROVIDER_SITE_OTHER): Payer: BC Managed Care – PPO | Admitting: Pharmacist

## 2014-07-06 ENCOUNTER — Other Ambulatory Visit: Payer: Self-pay | Admitting: Cardiology

## 2014-07-06 ENCOUNTER — Encounter (HOSPITAL_COMMUNITY): Payer: BC Managed Care – PPO

## 2014-07-06 DIAGNOSIS — E785 Hyperlipidemia, unspecified: Secondary | ICD-10-CM

## 2014-07-06 NOTE — Patient Instructions (Signed)
Start Praluent 54m every 2 weeks.  Your next injections will be on 12/29 and 1/12.  If you have any problems, please call SGay Fillerat [phone]   Recheck labs in 6-8 weeks.

## 2014-07-06 NOTE — Addendum Note (Signed)
Encounter addended by: Gean Maidens on: 07/06/2014  3:34 PM<BR>     Documentation filed: Flowsheet VN, Notes Section

## 2014-07-06 NOTE — Progress Notes (Signed)
Patient graduated from [facility] today on 10/16/13 after completing 36 sessions. He achieved LTG of 30 minutes of aerobic exercise at Max Met level of 3.64. All patients vitals are WNL. Patient has met with dietician. Discharge instruction has been reviewed in detail and patient stated an understanding of material given. Patient plans to exercise at home on his Treadmill. Cardiac Rehab staff will make f/u calls at 1 month, 6 months, and 1 year. Patient had no complaints of any abnormal S/S or pain on their exit visit. Patient was able to complete six minute walk test.

## 2014-07-07 ENCOUNTER — Ambulatory Visit (HOSPITAL_COMMUNITY): Payer: BC Managed Care – PPO | Attending: Cardiology | Admitting: Cardiology

## 2014-07-07 ENCOUNTER — Other Ambulatory Visit (HOSPITAL_COMMUNITY): Payer: Self-pay | Admitting: Cardiology

## 2014-07-07 ENCOUNTER — Other Ambulatory Visit (HOSPITAL_COMMUNITY): Payer: Self-pay | Admitting: *Deleted

## 2014-07-07 ENCOUNTER — Encounter (HOSPITAL_COMMUNITY)
Admission: RE | Admit: 2014-07-07 | Discharge: 2014-07-07 | Disposition: A | Discharge status: Home / Self Care | Payer: BC Managed Care – PPO | Source: Ambulatory Visit | Attending: Cardiology | Admitting: Cardiology

## 2014-07-07 DIAGNOSIS — I2510 Atherosclerotic heart disease of native coronary artery without angina pectoris: Secondary | ICD-10-CM | POA: Insufficient documentation

## 2014-07-07 DIAGNOSIS — E785 Hyperlipidemia, unspecified: Secondary | ICD-10-CM | POA: Diagnosis not present

## 2014-07-07 DIAGNOSIS — R0989 Other specified symptoms and signs involving the circulatory and respiratory systems: Secondary | ICD-10-CM

## 2014-07-07 DIAGNOSIS — I739 Peripheral vascular disease, unspecified: Secondary | ICD-10-CM | POA: Diagnosis not present

## 2014-07-07 DIAGNOSIS — E119 Type 2 diabetes mellitus without complications: Secondary | ICD-10-CM | POA: Insufficient documentation

## 2014-07-07 DIAGNOSIS — I10 Essential (primary) hypertension: Secondary | ICD-10-CM | POA: Diagnosis not present

## 2014-07-07 NOTE — Assessment & Plan Note (Signed)
Pt's cholesterol remains above goal despite maximal tolerated doses of therapy.  Despite statin, zetia, and fenofibrate therapy for several months, she continues to develop plaque and require additional interventions.  Given this and her history of intolerance, will start her on PCSK-9.  Would prefer to pursue open label drug rather than the clinical trial so we can ensure she is receiving therapy.   She was instructed on appropriate injection technique and administered her first dose in the office with no problems.  Will start paperwork for insurance approval.

## 2014-07-07 NOTE — Progress Notes (Signed)
Patient is a pleasant 63 y.o. WF referred to lipid clinic by Dr. Aundra Dubin to consider her for PCSK-9 inhibitor therapy.  Patient has a h/o CABG with SVG to RCA in 06/2004, followed by redo SVG to RCA in 07/2009.  She also has a h/o DES from left main into proximal LAD and recently required a DES to ostial LM in November . She has failed multiple lipid lowering medications in the past due to muscle aches and liver enzyme elevations.  She has failed pravastatin > 10 mg qd, Crestor 10-40 mg qd, Simvastatin,  Atorvastatin, and cholestyramine.  She was under the care of Dr. Lattie Haw and being seen by [facility].  She was able to look through her paper charts and bring copies of the files with her. She is currently able to tolerate pravastatin 10 mg with only mild muscle aches, but any higher dose has been intolerable.  She is also taking Zetia 10 mg qd and fenofibrate 145 mg qd without issue.  LDL in 07/2013 by PCP was 106 mg/dL on the combination of pravastatin 10 mg qd, Zetia, and fenofibrate.  Patient failed BAS in past due to elevated LFTs, and due to h/o IBS and gastroparesis, will not consider rechallenging her with BAS.  She was evaluated for inclusion into the SPIRE trials but did not qualify given her requirement for PCI in November. She would need to wait another several months before she could be considered for randomization.  Patient has a h/o Hodgkins lymphoma in 1981 which she received radiation for.  This was curative.  In 2008, patient was found to have a tumor in her right kidney on ultrasound.  The tumor was isolated to the kidney, and had her right kidney removed in 2008.  She's had CT scan yearly since 2008 all showing there is no recurrence of cancer.    RF:  CABG with SVG to RCA in 06/2004, followed by redo SVG to RCA in 07/2009; h/o PCI, diabetes, HTN, age - LDL goal < 70, non-HDL goal < 100 Meds:  Pravastatin 10 mg qd, Zetia 10 mg qd, Fenofibrate 145 mg qd Intolerant:  he has failed  pravastatin 20-40 mg qd (myalgias), Crestor 10-40 mg qd (myalgias), Simvastatin (elevated LFTs),  Atorvastatin (elevated LFTs), and cholestyramine (elevated LFTs per patient).  She was under the care of Dr. Lattie Haw and being seen by [facility] at time of failing these.  Labs: 06/2014: TC 152, TG 158, HDL 37, LDL 83, LFTs normal (pravastatin 64m daily, Zetia 11mdaily and fenofibrate 14529maily) 02/2014:  TC 184, TG 182, HDL 41, LDL 106, LFTs okay (Pravastatin 10 mg qd, Zetia 10 mg qd, Fenofibrate 145 mg qd)  Current Outpatient Prescriptions  Medication Sig Dispense Refill  . ALPRAZolam (XANAX) 0.5 MG tablet Take 0.25-0.5 mg by mouth 2 (two) times daily as needed for anxiety.     . aMarland Kitchenpirin EC 81 MG tablet Take 81 mg by mouth at bedtime.    . aMarland Kitchenithromycin (ZITHROMAX) 500 MG tablet 1 tablet by mouth one hour prior to dental appointment/procedure 2 tablet 0  . BRILINTA 90 MG TABS tablet TAKE ONE TABLET TWICE DAILY 60 tablet 6  . Calcium Carbonate-Vitamin D (RA CALCIUM PLUS VITAMIN D) 600-400 MG-UNIT per tablet Take 1 tablet by mouth 2 (two) times daily.     . cholecalciferol (VITAMIN D) 1000 UNITS tablet Take 1,000 Units by mouth daily.     . clobetasol (TEMOVATE) 0.05 % external solution Apply 1 application topically daily  as needed (dermatosis).    Marland Kitchen desoximetasone (TOPICORT) 0.05 % cream Apply 1 application topically 2 (two) times daily as needed. When skin starts peeling    . docusate sodium (COLACE) 100 MG capsule Take 1 capsule (100 mg total) by mouth 2 (two) times daily. 60 capsule 3  . fenofibrate (TRICOR) 145 MG tablet TAKE ONE (1) TABLET EACH DAY 90 tablet 3  . ferrous fumarate (HEMOCYTE - 106 MG FE) 325 (106 FE) MG TABS tablet Take 1 tablet (106 mg of iron total) by mouth 2 (two) times daily. Or as directed. Take with Vitamin -C (Patient taking differently: Take 1 tablet by mouth 2 (two) times a week. Or as directed. Take with Vitamin -C) 100 each 1  . fluticasone (FLONASE) 50  MCG/ACT nasal spray Place 2 sprays into the nose daily as needed for rhinitis. For nasal congestion    . furosemide (LASIX) 40 MG tablet TAKE TWO TABLETS ONCE DAILY 60 tablet 3  . glucagon (GLUCAGON EMERGENCY) 1 MG injection Inject 1 mg into the vein once as needed (for severe reaction).    Marland Kitchen HM OMEPRAZOLE 20 MG TBEC TAKE ONE TABLET TWICE DAILY 84 each 1  . Insulin Human (INSULIN PUMP) 100 unit/ml SOLN Inject into the skin continuous. Apidra Basal Rate midnight-0700 : 0.55 700-830: 2.25 830- 1930: 2.5 1930-midnight: 3.2    . isosorbide mononitrate (IMDUR) 60 MG 24 hr tablet Take 1.5 tablets (90 mg total) by mouth daily. 45 tablet 3  . Magnesium 250 MG TABS Take 250 mg by mouth daily.     . metoprolol (LOPRESSOR) 50 MG tablet Take 75 mg by mouth 2 (two) times daily.     . Multiple Vitamins-Minerals (MULTIVITAMIN WITH MINERALS) tablet Take 1 tablet by mouth daily.      . nitroGLYCERIN (NITROLINGUAL) 0.4 MG/SPRAY spray Place 1 spray under the tongue every 5 (five) minutes as needed for chest pain. 12 g 1  . omeprazole (PRILOSEC OTC) 20 MG tablet Take 20 mg by mouth daily.    . ONE TOUCH ULTRA TEST test strip     . polyethylene glycol (MIRALAX / GLYCOLAX) packet Take 8.5 g by mouth daily as needed. For constipation    . potassium chloride SA (K-DUR,KLOR-CON) 20 MEQ tablet TAKE TWO (2) TABLETS BY MOUTH DAILY 60 tablet 1  . pravastatin (PRAVACHOL) 10 MG tablet TAKE ONE (1) TABLET BY MOUTH EVERY DAY 90 tablet 1  . vitamin C (ASCORBIC ACID) 500 MG tablet Take 250 mg by mouth daily.     . Wheat Dextrin (BENEFIBER PO) Take 45 mLs by mouth every morning. 3 tablespoons each morning    . ZETIA 10 MG tablet TAKE ONE TABLET BY MOUTH EVERY NIGHT AT BEDTIME 90 tablet 0   No current facility-administered medications for this visit.   Allergies  Allergen Reactions  . Clindamycin/Lincomycin Anaphylaxis and Shortness Of Breath  . Humalog [Insulin Lispro] Itching  . Penicillins Anaphylaxis, Hives and Rash     Childhood allergy   . Gentamycin [Gentamicin] Hives, Itching and Rash    Reaction noted post loop recorder implant, where gentamycin was used for irrigation. Topical prep  . Tizanidine Other (See Comments)    Hallucinate  . Atorvastatin Other (See Comments)     increased LFT's  . Avandia [Rosiglitazone Maleate]     Congestive heart failure  . Cholestyramine     Elevated LFTs  . Codeine Nausea And Vomiting  . Crestor [Rosuvastatin] Other (See Comments)    Muscle and joint  pain &  . Erythromycin Nausea And Vomiting  . Metformin     Congestive heart failure  . Novolog [Insulin Aspart] Itching    Patient reports she has severe itching with Novolog insulin  . Plavix [Clopidogrel Bisulfate]     P2Y12 testing = 271 while on Plavix  . Plavix [Clopidogrel]     Does not work for patient  . Pravastatin Other (See Comments)    In high doses (20-26m) causes muscle and joint pain   . Simvastatin     Increased LFT's  . Tamiflu [Oseltamivir] Nausea And Vomiting  . Cephalexin Swelling and Rash    Extremities swell   . Iodinated Diagnostic Agents Other (See Comments)    PAIN DURING LYMPHANGIOGRAM '86, NO ALLERGY TO IV CONTRAST  . Neosporin [Neomycin-Bacitracin Zn-Polymyx] Rash   Family History  Problem Relation Age of Onset  . Heart attack Mother   . Heart disease Mother   . Heart attack Father   . Diabetes Father   . Heart disease Father   . Hypertension Brother   . Glaucoma Brother   . Lung cancer Sister     meta  . Breast cancer Sister     half sister (Paternal)  . Ovarian cancer Sister   . Cervical cancer Sister     half sister (maternal)  . Breast cancer Sister   . Colon cancer Neg Hx   . Stomach cancer Neg Hx   . Lung cancer Sister     half sister (maternal)

## 2014-07-07 NOTE — Progress Notes (Signed)
Lower arterial doppler and bilateral duplex performed

## 2014-07-09 ENCOUNTER — Encounter (HOSPITAL_COMMUNITY)
Admission: RE | Admit: 2014-07-09 | Discharge: 2014-07-09 | Disposition: A | Discharge status: Home / Self Care | Payer: BC Managed Care – PPO | Source: Ambulatory Visit | Attending: Cardiology | Admitting: Cardiology

## 2014-07-09 DIAGNOSIS — I2510 Atherosclerotic heart disease of native coronary artery without angina pectoris: Secondary | ICD-10-CM | POA: Diagnosis not present

## 2014-07-12 ENCOUNTER — Encounter (HOSPITAL_COMMUNITY)
Admission: RE | Admit: 2014-07-12 | Discharge: 2014-07-12 | Disposition: A | Discharge status: Home / Self Care | Payer: BC Managed Care – PPO | Source: Ambulatory Visit | Attending: Cardiology | Admitting: Cardiology

## 2014-07-12 ENCOUNTER — Other Ambulatory Visit: Payer: Self-pay | Admitting: Cardiology

## 2014-07-12 DIAGNOSIS — I2510 Atherosclerotic heart disease of native coronary artery without angina pectoris: Secondary | ICD-10-CM | POA: Diagnosis not present

## 2014-07-14 ENCOUNTER — Encounter (HOSPITAL_COMMUNITY)
Admission: RE | Admit: 2014-07-14 | Discharge: 2014-07-14 | Disposition: A | Discharge status: Home / Self Care | Payer: BC Managed Care – PPO | Source: Ambulatory Visit | Attending: Cardiology | Admitting: Cardiology

## 2014-07-14 DIAGNOSIS — I2510 Atherosclerotic heart disease of native coronary artery without angina pectoris: Secondary | ICD-10-CM | POA: Diagnosis not present

## 2014-07-16 ENCOUNTER — Encounter (HOSPITAL_COMMUNITY): Payer: BC Managed Care – PPO

## 2014-07-19 ENCOUNTER — Encounter (HOSPITAL_COMMUNITY)
Admission: RE | Admit: 2014-07-19 | Discharge: 2014-07-19 | Disposition: A | Discharge status: Home / Self Care | Payer: BC Managed Care – PPO | Source: Ambulatory Visit | Attending: Cardiology | Admitting: Cardiology

## 2014-07-19 DIAGNOSIS — I2510 Atherosclerotic heart disease of native coronary artery without angina pectoris: Secondary | ICD-10-CM | POA: Diagnosis not present

## 2014-07-19 NOTE — Telephone Encounter (Signed)
Will fax a copy of arterial dopplers to patient's endocrinologist at [facility]. [name] MD Patient also wants a copy for her records. Will mail that.

## 2014-07-19 NOTE — Telephone Encounter (Signed)
New message    Patient calling  Copy of your lower aterial doppler . Also fax copy to Dr. Mora Bellman. @ [facility] [phone].  / [phone].   1. Are you calling in reference to your FMLA or disability form? No   2. What is your question in regards to FMLA or disability form? No    3. Do you need copies of your medical records? Yes   4. Are you waiting on a nurse to call you back with results or are you wanting copies of your results? Yes - could not print from my-chart.

## 2014-07-21 ENCOUNTER — Encounter (HOSPITAL_COMMUNITY)
Admission: RE | Admit: 2014-07-21 | Discharge: 2014-07-21 | Disposition: A | Discharge status: Home / Self Care | Payer: BC Managed Care – PPO | Source: Ambulatory Visit | Attending: Cardiology | Admitting: Cardiology

## 2014-07-21 DIAGNOSIS — I2510 Atherosclerotic heart disease of native coronary artery without angina pectoris: Secondary | ICD-10-CM | POA: Diagnosis not present

## 2014-07-21 NOTE — Progress Notes (Signed)
Patient is discharged from [facility] program today, July 21, 2014 with 9 sessions due to insurance coverage.  she achieved LTG of 30 minutes of aerobic exercise at max met level of 1.3.  All patient vitals are WNL.  Patient plans to exercise at home and possibly join the maintenance program starting in February.  Patient had no complaints of any abnormal S/S or pain on their exit visit.

## 2014-07-23 ENCOUNTER — Encounter (HOSPITAL_COMMUNITY): Payer: BC Managed Care – PPO

## 2014-07-26 ENCOUNTER — Encounter (HOSPITAL_COMMUNITY): Payer: BC Managed Care – PPO

## 2014-07-28 ENCOUNTER — Encounter (HOSPITAL_COMMUNITY): Payer: BC Managed Care – PPO

## 2014-07-29 ENCOUNTER — Ambulatory Visit (INDEPENDENT_AMBULATORY_CARE_PROVIDER_SITE_OTHER): Payer: BC Managed Care – PPO | Admitting: Cardiovascular Disease

## 2014-07-29 ENCOUNTER — Encounter: Payer: Self-pay | Admitting: Cardiovascular Disease

## 2014-07-29 VITALS — BP 138/68 | HR 72 | Ht 62.0 in | Wt 121.5 lb

## 2014-07-29 DIAGNOSIS — I739 Peripheral vascular disease, unspecified: Secondary | ICD-10-CM

## 2014-07-29 DIAGNOSIS — I25810 Atherosclerosis of coronary artery bypass graft(s) without angina pectoris: Secondary | ICD-10-CM

## 2014-07-29 DIAGNOSIS — I5032 Chronic diastolic (congestive) heart failure: Secondary | ICD-10-CM

## 2014-07-29 NOTE — Progress Notes (Signed)
Primary cardiologist: Dr. Aundra Dubin  HPI  This is a 64 yo with history of CAD s/p CABG in 12/05 and redo in 1/11, left main stenting, bioprosthetic AVR, diastolic CHF, and sick sinus syndrome s/p PCM. She was referred for evaluation of severe right leg claudication.  She had cardiac catheterization done in November 2015 with stenting of the left main for severe in-stent restenosis. An Angio-Seal was used at the end of the case. About 2 weeks after that, she started having severe exertional right leg pain and numbness. This is currently happening with minimal walking. It has limited her ability to perform cardiac rehabilitation.  She had an ABI done which was mildly reduced on the right side. Duplex showed no significant infrainguinal disease. There was evidence of severe stenosis in the right external iliac artery.  Allergies  Allergen Reactions  . Clindamycin/Lincomycin Anaphylaxis and Shortness Of Breath  . Humalog [Insulin Lispro] Itching  . Penicillins Anaphylaxis, Hives and Rash    Childhood allergy   . Gentamycin [Gentamicin] Hives, Itching and Rash    Reaction noted post loop recorder implant, where gentamycin was used for irrigation. Topical prep  . Tizanidine Other (See Comments)    Hallucinate  . Atorvastatin Other (See Comments)     increased LFT's  . Avandia [Rosiglitazone Maleate]     Congestive heart failure  . Cholestyramine     Elevated LFTs  . Codeine Nausea And Vomiting  . Crestor [Rosuvastatin] Other (See Comments)    Muscle and joint pain &  . Erythromycin Nausea And Vomiting  . Metformin     Congestive heart failure  . Novolog [Insulin Aspart] Itching    Patient reports she has severe itching with Novolog insulin  . Plavix [Clopidogrel Bisulfate]     P2Y12 testing = 271 while on Plavix  . Plavix [Clopidogrel]     Does not work for patient  . Pravastatin Other (See Comments)    In high doses (20-48m) causes muscle and joint pain   . Simvastatin     Increased  LFT's  . Tamiflu [Oseltamivir] Nausea And Vomiting  . Cephalexin Swelling and Rash    Extremities swell   . Iodinated Diagnostic Agents Other (See Comments)    PAIN DURING LYMPHANGIOGRAM '86, NO ALLERGY TO IV CONTRAST  . Neosporin [Neomycin-Bacitracin Zn-Polymyx] Rash     Current Outpatient Prescriptions on File Prior to Visit  Medication Sig Dispense Refill  . ALPRAZolam (XANAX) 0.5 MG tablet Take 0.25-0.5 mg by mouth 2 (two) times daily as needed for anxiety.     .Marland Kitchenaspirin EC 81 MG tablet Take 81 mg by mouth at bedtime.    .Marland Kitchenazithromycin (ZITHROMAX) 500 MG tablet 1 tablet by mouth one hour prior to dental appointment/procedure 2 tablet 0  . BRILINTA 90 MG TABS tablet TAKE ONE TABLET BY MOUTH TWICE A DAY 60 tablet 5  . Calcium Carbonate-Vitamin D (RA CALCIUM PLUS VITAMIN D) 600-400 MG-UNIT per tablet Take 1 tablet by mouth 2 (two) times daily.     . cholecalciferol (VITAMIN D) 1000 UNITS tablet Take 1,000 Units by mouth daily.     . clobetasol (TEMOVATE) 0.05 % external solution Apply 1 application topically daily as needed (dermatosis).    .Marland Kitchendesoximetasone (TOPICORT) 0.05 % cream Apply 1 application topically 2 (two) times daily as needed. When skin starts peeling    . docusate sodium (COLACE) 100 MG capsule Take 1 capsule (100 mg total) by mouth 2 (two) times daily. 60 capsule 3  .  fenofibrate (TRICOR) 145 MG tablet TAKE ONE (1) TABLET EACH DAY 90 tablet 3  . ferrous fumarate (HEMOCYTE - 106 MG FE) 325 (106 FE) MG TABS tablet Take 1 tablet (106 mg of iron total) by mouth 2 (two) times daily. Or as directed. Take with Vitamin -C (Patient taking differently: Take 1 tablet by mouth 2 (two) times a week. Or as directed. Take with Vitamin -C) 100 each 1  . fluticasone (FLONASE) 50 MCG/ACT nasal spray Place 2 sprays into the nose daily as needed for rhinitis. For nasal congestion    . furosemide (LASIX) 40 MG tablet TAKE TWO TABLETS BY MOUTH DAILY 60 tablet 3  . glucagon (GLUCAGON EMERGENCY) 1  MG injection Inject 1 mg into the vein once as needed (for severe reaction).    Marland Kitchen HM OMEPRAZOLE 20 MG TBEC TAKE ONE TABLET TWICE DAILY 84 each 1  . Insulin Human (INSULIN PUMP) 100 unit/ml SOLN Inject into the skin continuous. Apidra Basal Rate midnight-0700 : 0.55 700-830: 2.25 830- 1930: 2.5 1930-midnight: 3.2    . isosorbide mononitrate (IMDUR) 60 MG 24 hr tablet Take 1.5 tablets (90 mg total) by mouth daily. 45 tablet 3  . Magnesium 250 MG TABS Take 250 mg by mouth daily.     . metoprolol (LOPRESSOR) 50 MG tablet Take 75 mg by mouth 2 (two) times daily.     . Multiple Vitamins-Minerals (MULTIVITAMIN WITH MINERALS) tablet Take 1 tablet by mouth daily.      . nitroGLYCERIN (NITROLINGUAL) 0.4 MG/SPRAY spray Place 1 spray under the tongue every 5 (five) minutes as needed for chest pain. 12 g 1  . ONE TOUCH ULTRA TEST test strip     . polyethylene glycol (MIRALAX / GLYCOLAX) packet Take 8.5 g by mouth daily as needed. For constipation    . potassium chloride SA (K-DUR,KLOR-CON) 20 MEQ tablet TAKE TWO (2) TABLETS BY MOUTH DAILY 60 tablet 1  . pravastatin (PRAVACHOL) 10 MG tablet TAKE ONE (1) TABLET BY MOUTH EVERY DAY 90 tablet 1  . vitamin C (ASCORBIC ACID) 500 MG tablet Take 250 mg by mouth daily.     . Wheat Dextrin (BENEFIBER PO) Take 45 mLs by mouth every morning. 3 tablespoons each morning    . ZETIA 10 MG tablet TAKE ONE TABLET BY MOUTH EVERY NIGHT AT BEDTIME 90 tablet 0   No current facility-administered medications on file prior to visit.     Past Medical History  Diagnosis Date  . Aortic stenosis     a. bicuspid aortic valve; mean gradient of 20 mmHg in 2/10; b. s/p bioprosth AVR (19-mm Edwards pericardial valve) with a redo coronary artery bypass graft procedure in 07/2009; postoperative Dressler's syndrome    . Coronary artery disease     a. initial CABG with SVG-RCA in 12/05. b. redo SVG-RCA and bioprosthetic AVR in 1/11. d. 9/14 - PCI with DES to distal LM/proximal LAD was done  rather than bypass as she would have been a poor candidate for redo sternotomy. d. S/p DES to prox LM 05/2014.  Marland Kitchen Chronic diastolic CHF (congestive heart failure)     a. 9.2014 EchoP EF 60-65%, no rwma, bioprosth AVR, mean gradient of 77mHg, mild to mod MR, PASP 550mg.  . Marland Kitchenyperlipidemia   . Hypertension   . Small bowel obstruction     Recurrent; resolved after resection of a lipoma  . Osteopenia      hip on DEXA in October 2007.  . Marland Kitchennemia   . Carcinoma, renal  cell 05/2007    Laparoscopic right nephrectomy  . NASH (nonalcoholic steatohepatitis) 1999    -biopsy in 1999  . Diabetes mellitus 03/2010     TYPE II: Hemoglobin A1c of 7.4 in  03/2010; 8.4 in 06/2010; treated with insulin pump  . Migraines   . Duodenal ulcer     Remote; H. pylori positive  . Colitis, ischemic 2008  . Gastroesophageal reflux disease     ? of GASTROPARESIS; IRRITABLE BOWEL SYNDROME  . Hodgkin's disease 1991    Mantle radiation therapy  . Statin intolerance   . Hypercalcemia     Ca-11.1 in 07/2011  . Vitamin D deficiency   . Costochondritis   . Insomnia   . Iron deficiency anemia     a. 03/2013 EGD: essentially normal. Possible slow GIB.  Marland Kitchen Gastroparesis due to DM 05/01/2012  . PONV (postoperative nausea and vomiting)   . Hyperplastic gastric polyp 06/23/2012  . Arthritis   . Osteoporosis   . Pacemaker 08/21/2012    Medtronic Adapta L dual-chamber pacemaker-Dr. Lovena Le  . Anxiety   . Syncope     a. H/o recurrent syncope with pauses on loop recorder s/p Medtronic pacemaker 07/2012.  . CKD (chronic kidney disease), stage II   . PAF (paroxysmal atrial fibrillation)     a.  h/o brief episodes noted on ppm interrogation. b. given GI bleeding and need to be on both ASA 81 and Brilinta, risks likely outweigh benefits of anticoagulation.      Past Surgical History  Procedure Laterality Date  . Total abdominal hysterectomy w/ bilateral salpingoophorectomy  2000  . Breast excisional biopsy      benign,  bilateral  . Nephrectomy  2008    Laparoscopic right nephrectomy, renal cell carcinoma  . Coronary artery bypass graft  2005, 2011    CABG-most recent SVG to RCA-12/05; RCA occluded with patent graft in 2006; Redo bypass in 07/2009  . Tumor excision  1981    removal of Hodgkins lymphoma  . Exploratory laparotomy w/ bowel resection      Small bowel resection of lipoma  . Aortic valve replacement  2011    Aortic valve replacement surgery with a 19 mm bioprosthetic device post redo CABG January 2011.  . Colonoscopy      Multiple with adenomatous polyps  . Cervical biopsy    . Liver biopsy    . Bone marrow biopsy    . Esophagogastroduodenoscopy    . Esophagogastroduodenoscopy  06/23/2012    Procedure: ESOPHAGOGASTRODUODENOSCOPY (EGD);  Surgeon: Gatha Mayer, MD;  Location: [facility];  Service: Endoscopy;  Laterality: N/A;  . Colonoscopy  06/23/2012    Procedure: COLONOSCOPY;  Surgeon: Gatha Mayer, MD;  Location: [facility] ENDOSCOPY;  Service: Endoscopy;  Laterality: N/A;  [name] dilation  06/23/2012    Procedure: [name] DILATION;  Surgeon: Gatha Mayer, MD;  Location: [facility] ENDOSCOPY;  Service: Endoscopy;;  54 fr  . Pacemaker insertion  08/21/2012    Medtronic Adapta L dual-chamber pacemaker  . Esophagogastroduodenoscopy N/A 04/06/2013    Procedure: ESOPHAGOGASTRODUODENOSCOPY (EGD);  Surgeon: Irene Shipper, MD;  Location: [facility] ENDOSCOPY;  Service: Endoscopy;  Laterality: N/A;  . Givens capsule study N/A 04/30/2013    Procedure: GIVENS CAPSULE STUDY;  Surgeon: Gatha Mayer, MD;  Location: [place];  Service: Endoscopy;  Laterality: N/A;  . Loop recorder implant N/A 12/10/2011    Procedure: LOOP RECORDER IMPLANT;  Surgeon: Evans Lance, MD;  Location: [facility] CATH LAB;  Service: Cardiovascular;  Laterality: N/A;  . Permanent pacemaker insertion N/A 08/21/2012    Procedure: PERMANENT PACEMAKER INSERTION;  Surgeon: Evans Lance, MD;  Location: [facility] CATH LAB;  Service: Cardiovascular;  Laterality:  N/A;  . Left and right heart catheterization with coronary angiogram N/A 04/07/2013    Procedure: LEFT AND RIGHT HEART CATHETERIZATION WITH CORONARY ANGIOGRAM;  Surgeon: Larey Dresser, MD;  Location: [facility] CATH LAB;  Service: Cardiovascular;  Laterality: N/A;  . Percutaneous coronary stent intervention (pci-s) N/A 04/09/2013    Procedure: PERCUTANEOUS CORONARY STENT INTERVENTION (PCI-S);  Surgeon: Burnell Blanks, MD;  [facility] C[facility] LAB;  Service: Cardiovascular;  Laterality: N/A;  . Left heart catheterization with coronary/graft angiogram N/A 06/07/2014    Procedure: LEFT HEART CATHETERIZATION WITH Beatrix Fetters;  Surgeon: Burnell Blanks, MD;  Location: [facility] CATH LAB;  Service: Cardiovascular;  Laterality: N/A;     Family History  Problem Relation Age of Onset  . Heart attack Mother   . Heart disease Mother   . Heart attack Father   . Diabetes Father   . Heart disease Father   . Hypertension Brother   . Glaucoma Brother   . Lung cancer Sister     meta  . Breast cancer Sister     half sister (Paternal)  . Ovarian cancer Sister   . Cervical cancer Sister     half sister (maternal)  . Breast cancer Sister   . Colon cancer Neg Hx   . Stomach cancer Neg Hx   . Lung cancer Sister     half sister (maternal)     History   Social History  . Marital Status: Married    Spouse Name: N/A    Number of Children: 2  . Years of Education: N/A   Occupational History  . full time     elementary principal   Social History Main Topics  . Smoking status: Never Smoker   . Smokeless tobacco: Never Used  . Alcohol Use: No  . Drug Use: No  . Sexual Activity: Yes   Other Topics Concern  . 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     ROS A 10 point review of system was performed. It is negative other than that mentioned in the history of present illness.   PHYSICAL EXAM   BP 138/68 mmHg   Pulse 72  Ht 5' 2"  (1.575 m)  Wt 121 lb 8 oz (55.112 kg)  BMI 22.22 kg/m2 Constitutional: She is oriented to person, place, and time. She appears well-developed and well-nourished. No distress.  HENT: No nasal discharge.  Head: Normocephalic and atraumatic.  Eyes: Pupils are equal and round. No discharge.  Neck: Normal range of motion. Neck supple. No JVD present. No thyromegaly present.  Cardiovascular: Normal rate, regular rhythm, normal heart sounds. Exam reveals no gallop and no friction rub. No murmur heard.  Pulmonary/Chest: Effort normal and breath sounds normal. No stridor. No respiratory distress. She has no wheezes. She has no rales. She exhibits no tenderness.  Abdominal: Soft. Bowel sounds are normal. She exhibits no distension. There is no tenderness. There is no rebound and no guarding.  Musculoskeletal: Normal range of motion. She exhibits no edema and no tenderness.  Neurological: She is alert and oriented to person, place, and time. Coordination normal.  Skin: Skin is warm and dry. No rash noted. She is not diaphoretic. No erythema. No pallor.  Psychiatric: She has a  normal mood and affect. Her behavior is normal. Judgment and thought content normal.  Vascular: Femoral pulse is very diminished on the right side and normal on the left side. Distal pulses are palpable only on the left side.     ASSESSMENT AND PLAN

## 2014-07-29 NOTE — Patient Instructions (Signed)
Your physician has requested that you have a peripheral vascular angiogram. This exam is performed at the hospital. During this exam IV contrast is used to look at arterial blood flow. Please review the information sheet given for details.  Call us back once you decide about scheduling the angiogram.

## 2014-07-29 NOTE — Assessment & Plan Note (Signed)
The patient has severe right leg claudication due to severe stenosis affecting the right external iliac artery. This started within 2 weeks after deploying an Angio-Seal in the right common femoral artery which raises the possibility of a mechanical complication. I recommend proceeding with abdominal aortogram and lower extremity angiography  via the left femoral artery. Risks, benefits and alternatives were discussed with the patient.  She is already on dual antiplatelet therapy.

## 2014-07-29 NOTE — Assessment & Plan Note (Signed)
She had stenting of the left main for in-stent restenosis in November 2015. She has no symptoms of angina. Continue medical therapy.

## 2014-07-29 NOTE — Assessment & Plan Note (Signed)
She appears to be euvolemic. 

## 2014-07-30 ENCOUNTER — Telehealth: Payer: Self-pay | Admitting: Oncology

## 2014-07-30 ENCOUNTER — Encounter (HOSPITAL_COMMUNITY): Payer: BC Managed Care – PPO

## 2014-08-02 ENCOUNTER — Encounter (HOSPITAL_COMMUNITY): Payer: BC Managed Care – PPO

## 2014-08-02 ENCOUNTER — Telehealth: Payer: Self-pay | Admitting: Cardiology

## 2014-08-02 NOTE — Telephone Encounter (Signed)
New Msg       Pt states [employer] called wanting a lot of info.   Pt needs to know how the next dosage will get to her.   Please call pt.

## 2014-08-03 NOTE — Telephone Encounter (Signed)
Spoke with pt.  She got a call from [place] on Saturday night asking several questions and she was unsure if it was for Praluent or not.  Pt is enrolled in the [place] program and verified that the phone number she had to call back was the same as the [employer], so this was the company calling to set up delivery.  She will call them back and confirm information.   Pt also mentions that she will be changing insurances in March.  Will await to do paperwork until she has her new card.  She should have the information in February.

## 2014-08-04 ENCOUNTER — Encounter (HOSPITAL_COMMUNITY): Payer: BC Managed Care – PPO

## 2014-08-06 ENCOUNTER — Encounter (HOSPITAL_COMMUNITY): Payer: BC Managed Care – PPO

## 2014-08-09 ENCOUNTER — Other Ambulatory Visit (INDEPENDENT_AMBULATORY_CARE_PROVIDER_SITE_OTHER): Payer: BC Managed Care – PPO | Admitting: *Deleted

## 2014-08-09 ENCOUNTER — Other Ambulatory Visit: Payer: BC Managed Care – PPO

## 2014-08-09 ENCOUNTER — Encounter (HOSPITAL_COMMUNITY): Payer: BC Managed Care – PPO

## 2014-08-09 ENCOUNTER — Ambulatory Visit: Payer: BC Managed Care – PPO | Admitting: Oncology

## 2014-08-09 DIAGNOSIS — I10 Essential (primary) hypertension: Secondary | ICD-10-CM

## 2014-08-09 DIAGNOSIS — E785 Hyperlipidemia, unspecified: Secondary | ICD-10-CM

## 2014-08-09 LAB — CBC WITH DIFFERENTIAL/PLATELET
Basophils Absolute: 0 10*3/uL (ref 0.0–0.1)
Basophils Relative: 0.4 % (ref 0.0–3.0)
Eosinophils Absolute: 0.2 10*3/uL (ref 0.0–0.7)
Eosinophils Relative: 1.7 % (ref 0.0–5.0)
HEMATOCRIT: 37.1 % (ref 36.0–46.0)
Hemoglobin: 12 g/dL (ref 12.0–15.0)
LYMPHS ABS: 1.7 10*3/uL (ref 0.7–4.0)
Lymphocytes Relative: 18.4 % (ref 12.0–46.0)
MCHC: 32.3 g/dL (ref 30.0–36.0)
MCV: 89.5 fl (ref 78.0–100.0)
Monocytes Absolute: 0.5 10*3/uL (ref 0.1–1.0)
Monocytes Relative: 5.7 % (ref 3.0–12.0)
Neutro Abs: 6.8 10*3/uL (ref 1.4–7.7)
Neutrophils Relative %: 73.8 % (ref 43.0–77.0)
Platelets: 157 10*3/uL (ref 150.0–400.0)
RBC: 4.14 Mil/uL (ref 3.87–5.11)
RDW: 14.1 % (ref 11.5–15.5)
WBC: 9.1 10*3/uL (ref 4.0–10.5)

## 2014-08-09 LAB — LIPID PANEL
Cholesterol: 69 mg/dL (ref 0–200)
HDL: 37.5 mg/dL — AB (ref >39.00)
LDL Cholesterol: 12 mg/dL (ref 0–99)
NONHDL: 31.5
TRIGLYCERIDES: 98 mg/dL (ref 0.0–149.0)
Total CHOL/HDL Ratio: 2
VLDL: 19.6 mg/dL (ref 0.0–40.0)

## 2014-08-09 LAB — HEPATIC FUNCTION PANEL
ALK PHOS: 71 U/L (ref 39–117)
ALT: 17 U/L (ref 0–35)
AST: 20 U/L (ref 0–37)
Albumin: 4.3 g/dL (ref 3.5–5.2)
BILIRUBIN TOTAL: 0.4 mg/dL (ref 0.2–1.2)
Bilirubin, Direct: 0.1 mg/dL (ref 0.0–0.3)
TOTAL PROTEIN: 7.7 g/dL (ref 6.0–8.3)

## 2014-08-09 NOTE — Addendum Note (Signed)
Addended by: Eulis Foster on: 08/09/2014 08:24 AM   Modules accepted: Orders

## 2014-08-09 NOTE — Addendum Note (Signed)
Addended by: Eulis Foster on: 08/09/2014 08:29 AM   Modules accepted: Orders

## 2014-08-11 ENCOUNTER — Encounter (HOSPITAL_COMMUNITY): Payer: BC Managed Care – PPO

## 2014-08-11 ENCOUNTER — Ambulatory Visit (INDEPENDENT_AMBULATORY_CARE_PROVIDER_SITE_OTHER): Payer: BC Managed Care – PPO | Admitting: Pharmacist

## 2014-08-11 DIAGNOSIS — E785 Hyperlipidemia, unspecified: Secondary | ICD-10-CM

## 2014-08-11 NOTE — Patient Instructions (Signed)
Continue Praluent 51m every 2 weeks  Stop Tricor.  If your triglycerides go back up, we can add it back.   Recheck labs in 2 months.

## 2014-08-12 ENCOUNTER — Encounter: Payer: Self-pay | Admitting: Pharmacist

## 2014-08-12 ENCOUNTER — Other Ambulatory Visit: Payer: Self-pay | Admitting: Radiology

## 2014-08-12 DIAGNOSIS — I6523 Occlusion and stenosis of bilateral carotid arteries: Secondary | ICD-10-CM

## 2014-08-13 ENCOUNTER — Encounter (HOSPITAL_COMMUNITY): Payer: BC Managed Care – PPO

## 2014-08-13 ENCOUNTER — Other Ambulatory Visit: Payer: Self-pay | Admitting: Cardiology

## 2014-08-16 ENCOUNTER — Encounter: Payer: BC Managed Care – PPO | Admitting: Internal Medicine

## 2014-08-16 ENCOUNTER — Encounter (HOSPITAL_COMMUNITY): Payer: BC Managed Care – PPO

## 2014-08-17 NOTE — Progress Notes (Signed)
Patient is a pleasant 64 y.o. WF referred to lipid clinic by Dr. Aundra Dubin to consider her for PCSK-9 inhibitor therapy.  Patient has a h/o CABG with SVG to RCA in 06/2004, followed by redo SVG to RCA in 07/2009.  She also has a h/o DES from left main into proximal LAD and recently required a DES to ostial LM in November . She has failed multiple lipid lowering medications in the past due to muscle aches and liver enzyme elevations.  She has failed pravastatin > 10 mg qd, Crestor 10-40 mg qd, Simvastatin,  Atorvastatin, and cholestyramine.  She was under the care of Dr. Lattie Haw and being seen by [facility].  She was able to look through her paper charts and bring copies of the files with her. She is currently able to tolerate pravastatin 10 mg with only mild muscle aches, but any higher dose has been intolerable.  She is also taking Zetia 10 mg qd and fenofibrate 145 mg qd without issue.  LDL in 07/2013 by PCP was 106 mg/dL on the combination of pravastatin 10 mg qd, Zetia, and fenofibrate.  Patient failed BAS in past due to elevated LFTs, and due to h/o IBS and gastroparesis, will not consider rechallenging her with BAS.  She was evaluated for inclusion into the SPIRE trials but did not qualify given her requirement for PCI in November. She would need to wait another several months before she could be considered for randomization.  Patient has a h/o Hodgkins lymphoma in 1981 which she received radiation for.  This was curative.  In 2008, patient was found to have a tumor in her right kidney on ultrasound.  The tumor was isolated to the kidney, and had her right kidney removed in 2008.  She's had CT scan yearly since 2008 all showing there is no recurrence of cancer.    At her last visit, she was started on Praluent 97m every 2 weeks.  Pt states she has been doing well with the injection.  She notices some muscle aches but nothing severe.  She also noticed that her blood sugars would increase for a few days  after the injection.    RF:  CABG with SVG to RCA in 06/2004, followed by redo SVG to RCA in 07/2009; h/o PCI, diabetes, HTN, age - LDL goal < 70, non-HDL goal < 100 Meds:  Pravastatin 10 mg qd, Zetia 10 mg qd, Fenofibrate 145 mg qd, Praluent 793mevery 2 weeks.  Intolerant:  he has failed pravastatin 20-40 mg qd (myalgias), Crestor 10-40 mg qd (myalgias), Simvastatin (elevated LFTs),  Atorvastatin (elevated LFTs), and cholestyramine (elevated LFTs per patient).  She was under the care of Dr[facility]  Labs: 07/2014: TC 69, TG 98, HDL 27, LDL 12, LFTs normal (pravastatin 1075maily, Zetia 17m26mily, and fenofibrate 145mg54mly) 06/2014: TC 152, TG 158, HDL 37, LDL 83, LFTs normal (pravastatin 17mg 4my, Zetia 17mg d39m and fenofibrate 145mg da12m 02/2014:  TC 184, TG 182, HDL 41, LDL 106, LFTs okay (Pravastatin 10 mg qd, Zetia 10 mg qd, Fenofibrate 145 mg qd)  Current Outpatient Prescriptions  Medication Sig Dispense Refill  . Alirocumab 75 MG/ML SOPN Inject 75 mg into the skin every 14 (fourteen) days.    . ALPRAZolam (XANAX) 0.5 MG tablet Take 0.25-0.5 mg by mouth 2 (two) times daily as needed for anxiety.     . aspiriMarland Kitchen EC 81 MG tablet Take 81  mg by mouth at bedtime.    Marland Kitchen azithromycin (ZITHROMAX) 500 MG tablet 1 tablet by mouth one hour prior to dental appointment/procedure 2 tablet 0  . BRILINTA 90 MG TABS tablet TAKE ONE TABLET BY MOUTH TWICE A DAY 60 tablet 5  . Calcium Carbonate-Vitamin D (RA CALCIUM PLUS VITAMIN D) 600-400 MG-UNIT per tablet Take 1 tablet by mouth 2 (two) times daily.     . cholecalciferol (VITAMIN D) 1000 UNITS tablet Take 1,000 Units by mouth daily.     . clobetasol (TEMOVATE) 0.05 % external solution Apply 1 application topically daily as needed (dermatosis).    Marland Kitchen desoximetasone (TOPICORT) 0.05 % cream Apply 1 application topically 2 (two) times daily as needed. When skin starts peeling    . diclofenac  sodium (VOLTAREN) 1 % GEL Apply topically as needed.     . docusate sodium (COLACE) 100 MG capsule Take 1 capsule (100 mg total) by mouth 2 (two) times daily. 60 capsule 3  . ferrous fumarate (HEMOCYTE - 106 MG FE) 325 (106 FE) MG TABS tablet Take 1 tablet (106 mg of iron total) by mouth 2 (two) times daily. Or as directed. Take with Vitamin -C (Patient taking differently: Take 1 tablet by mouth 2 (two) times a week. Or as directed. Take with Vitamin -C) 100 each 1  . fluticasone (FLONASE) 50 MCG/ACT nasal spray Place 2 sprays into the nose daily as needed for rhinitis. For nasal congestion    . furosemide (LASIX) 40 MG tablet TAKE TWO TABLETS BY MOUTH DAILY 60 tablet 3  . glucagon (GLUCAGON EMERGENCY) 1 MG injection Inject 1 mg into the vein once as needed (for severe reaction).    Marland Kitchen HM OMEPRAZOLE 20 MG TBEC TAKE ONE TABLET TWICE DAILY 84 each 1  . Insulin Human (INSULIN PUMP) 100 unit/ml SOLN Inject into the skin continuous. Apidra Basal Rate midnight-0700 : 0.55 700-830: 2.25 830- 1930: 2.5 1930-midnight: 3.2    . isosorbide mononitrate (IMDUR) 60 MG 24 hr tablet Take 1.5 tablets (90 mg total) by mouth daily. 45 tablet 3  . Magnesium 250 MG TABS Take 250 mg by mouth daily.     . metoprolol (LOPRESSOR) 50 MG tablet Take 75 mg by mouth 2 (two) times daily.     . Multiple Vitamins-Minerals (MULTIVITAMIN WITH MINERALS) tablet Take 1 tablet by mouth daily.      . nitroGLYCERIN (NITROLINGUAL) 0.4 MG/SPRAY spray Place 1 spray under the tongue every 5 (five) minutes as needed for chest pain. 12 g 1  . ONE TOUCH ULTRA TEST test strip     . polyethylene glycol (MIRALAX / GLYCOLAX) packet Take 8.5 g by mouth daily as needed. For constipation    . potassium chloride SA (K-DUR,KLOR-CON) 20 MEQ tablet TAKE TWO (2) TABLETS BY MOUTH DAILY 60 tablet 3  . pravastatin (PRAVACHOL) 10 MG tablet TAKE ONE (1) TABLET BY MOUTH EVERY DAY 90 tablet 1  . vitamin C (ASCORBIC ACID) 500 MG tablet Take 250 mg by mouth  daily.     . Wheat Dextrin (BENEFIBER PO) Take 45 mLs by mouth every morning. 3 tablespoons each morning    . ZETIA 10 MG tablet TAKE ONE TABLET BY MOUTH EVERY NIGHT AT BEDTIME 90 tablet 0   No current facility-administered medications for this visit.   Allergies  Allergen Reactions  . Clindamycin/Lincomycin Anaphylaxis and Shortness Of Breath  . Humalog [Insulin Lispro] Itching  . Penicillins Anaphylaxis, Hives and Rash    Childhood allergy   .  Gentamycin [Gentamicin] Hives, Itching and Rash    Reaction noted post loop recorder implant, where gentamycin was used for irrigation. Topical prep  . Tizanidine Other (See Comments)    Hallucinate  . Atorvastatin Other (See Comments)     increased LFT's  . Avandia [Rosiglitazone Maleate]     Congestive heart failure  . Cholestyramine     Elevated LFTs  . Codeine Nausea And Vomiting  . Crestor [Rosuvastatin] Other (See Comments)    Muscle and joint pain &  . Erythromycin Nausea And Vomiting  . Metformin     Congestive heart failure  . Novolog [Insulin Aspart] Itching    Patient reports she has severe itching with Novolog insulin  . Plavix [Clopidogrel Bisulfate]     P2Y12 testing = 271 while on Plavix  . Plavix [Clopidogrel]     Does not work for patient  . Pravastatin Other (See Comments)    In high doses (20-30m) causes muscle and joint pain   . Simvastatin     Increased LFT's  . Tamiflu [Oseltamivir] Nausea And Vomiting  . Cephalexin Swelling and Rash    Extremities swell   . Iodinated Diagnostic Agents Other (See Comments)    PAIN DURING LYMPHANGIOGRAM '86, NO ALLERGY TO IV CONTRAST  . Neosporin [Neomycin-Bacitracin Zn-Polymyx] Rash   Family History  Problem Relation Age of Onset  . Heart attack Mother   . Heart disease Mother   . Heart attack Father   . Diabetes Father   . Heart disease Father   . Hypertension Brother   . Glaucoma Brother   . Lung cancer Sister     meta  . Breast cancer Sister     half sister  (Paternal)  . Ovarian cancer Sister   . Cervical cancer Sister     half sister (maternal)  . Breast cancer Sister   . Colon cancer Neg Hx   . Stomach cancer Neg Hx   . Lung cancer Sister     half sister (maternal)

## 2014-08-17 NOTE — Assessment & Plan Note (Addendum)
Pt's LDL greatly improved since starting Praluent.  LDL now 12.  ? How low is too low.  Reviewed data from clinical trial.  There were 288 pts with LDL <15 and none of them had any unusual side effects.  She is changing insurances in March so we need to leave statin and Zetia on her medication list for coverage purposes.  Will stop fenofibrate.  If TG increase, will have to add the fenofibrate back.  Plan to recheck labs in 2 months.

## 2014-08-18 ENCOUNTER — Encounter (HOSPITAL_COMMUNITY): Payer: BC Managed Care – PPO

## 2014-08-20 ENCOUNTER — Encounter (HOSPITAL_COMMUNITY): Payer: BC Managed Care – PPO

## 2014-08-23 ENCOUNTER — Encounter (HOSPITAL_COMMUNITY): Payer: BC Managed Care – PPO

## 2014-08-25 ENCOUNTER — Encounter (HOSPITAL_COMMUNITY): Payer: BC Managed Care – PPO

## 2014-08-26 ENCOUNTER — Encounter: Payer: Self-pay | Admitting: Internal Medicine

## 2014-08-27 ENCOUNTER — Encounter (HOSPITAL_COMMUNITY): Payer: BC Managed Care – PPO

## 2014-08-30 ENCOUNTER — Telehealth: Payer: Self-pay | Admitting: Cardiology

## 2014-08-30 ENCOUNTER — Encounter (HOSPITAL_COMMUNITY): Payer: BC Managed Care – PPO

## 2014-08-30 NOTE — Telephone Encounter (Signed)
New Msg        Pt calling, states she is having sores in her mouth and she isn't sure if it is related to her medications.   Please return call.

## 2014-08-30 NOTE — Telephone Encounter (Signed)
Pt states for about 4 weeks she has had a problem with dark red spots developing in her mouth. She usually gets 1-3 at a time that develop in her cheek on either side.  She states they turn white after 1-2 days then go away, rinsing with Listerene helps them go away quicker.  She does not have a history of cold sores.   She started Praulent 07/06/14. Pt asking if sores in her mouth could be related to Praulent, Eliquis , or any of her other medications.   Pt advised I will forward to Elberta Leatherwood, PhD for review. Pt asked if we would call cell number if not a home phone number.

## 2014-09-01 ENCOUNTER — Encounter (HOSPITAL_COMMUNITY): Payer: BC Managed Care – PPO

## 2014-09-02 NOTE — Telephone Encounter (Signed)
Pt went to see primary care doctor today and he thinks he may be related to low platelet counts.  Reviewed case with Praluent team.  There is no data to suggest that Praluent would lower immune response or make a person more susceptible to cold sore type issues.  There have been no reports of any mouth sores to this point either.  I have asked pt to continue to watch and let us know if she has any other issues.

## 2014-09-02 NOTE — Telephone Encounter (Signed)
Reviewed pt's symptoms with Alferd Apa, PharmD who is the [employer]

## 2014-09-03 ENCOUNTER — Telehealth: Payer: Self-pay | Admitting: Internal Medicine

## 2014-09-03 ENCOUNTER — Encounter (HOSPITAL_COMMUNITY): Payer: BC Managed Care – PPO

## 2014-09-03 NOTE — Telephone Encounter (Signed)
LMOVM w/ my direct #. 

## 2014-09-03 NOTE — Telephone Encounter (Signed)
New Message   Pt resch 3/8 [name] [name] appt for April and wanted to know if she should send a remote signal today or next week since this appt w/ [name] [name] is about 3 months past due. Please use the cell # before noon and home # after. Please call back and discuss.

## 2014-09-03 NOTE — Telephone Encounter (Signed)
New Message °

## 2014-09-06 ENCOUNTER — Ambulatory Visit (INDEPENDENT_AMBULATORY_CARE_PROVIDER_SITE_OTHER): Payer: BC Managed Care – PPO | Admitting: *Deleted

## 2014-09-06 ENCOUNTER — Encounter (HOSPITAL_COMMUNITY): Payer: BC Managed Care – PPO

## 2014-09-06 ENCOUNTER — Encounter: Payer: Self-pay | Admitting: Internal Medicine

## 2014-09-06 DIAGNOSIS — I495 Sick sinus syndrome: Secondary | ICD-10-CM

## 2014-09-06 NOTE — Telephone Encounter (Signed)
I agreed w/ pt to send a remote since last remote was received 05/10/14.  Pt aware ROV w/ Dr. Lovena Le 11/02/14.

## 2014-09-06 NOTE — Progress Notes (Signed)
Remote pacemaker transmission.   

## 2014-09-07 LAB — MDC_IDC_ENUM_SESS_TYPE_REMOTE
Battery Impedance: 137 Ohm
Battery Voltage: 2.8 V
Brady Statistic AP VS Percent: 0 %
Brady Statistic AS VS Percent: 100 %
Date Time Interrogation Session: 20160215155833
Lead Channel Impedance Value: 449 Ohm
Lead Channel Pacing Threshold Pulse Width: 0.4 ms
Lead Channel Pacing Threshold Pulse Width: 0.4 ms
Lead Channel Setting Pacing Amplitude: 1.5 V
Lead Channel Setting Pacing Amplitude: 2 V
Lead Channel Setting Pacing Pulse Width: 0.4 ms
Lead Channel Setting Sensing Sensitivity: 5.6 mV
MDC IDC MSMT BATTERY REMAINING LONGEVITY: 164 mo
MDC IDC MSMT LEADCHNL RA PACING THRESHOLD AMPLITUDE: 0.75 V
MDC IDC MSMT LEADCHNL RA SENSING INTR AMPL: 2.8 mV
MDC IDC MSMT LEADCHNL RV IMPEDANCE VALUE: 632 Ohm
MDC IDC MSMT LEADCHNL RV PACING THRESHOLD AMPLITUDE: 0.5 V
MDC IDC MSMT LEADCHNL RV SENSING INTR AMPL: 16 mV
MDC IDC STAT BRADY AP VP PERCENT: 0 %
MDC IDC STAT BRADY AS VP PERCENT: 0 %

## 2014-09-08 ENCOUNTER — Encounter (HOSPITAL_COMMUNITY): Payer: BC Managed Care – PPO

## 2014-09-10 ENCOUNTER — Encounter (HOSPITAL_COMMUNITY): Payer: BC Managed Care – PPO

## 2014-09-12 ENCOUNTER — Other Ambulatory Visit: Payer: Self-pay | Admitting: Cardiology

## 2014-09-13 ENCOUNTER — Encounter (HOSPITAL_COMMUNITY): Payer: BC Managed Care – PPO

## 2014-09-17 ENCOUNTER — Encounter: Payer: Self-pay | Admitting: Cardiology

## 2014-09-27 ENCOUNTER — Ambulatory Visit (HOSPITAL_COMMUNITY): Payer: Medicare Other | Attending: Internal Medicine | Admitting: Cardiology

## 2014-09-27 ENCOUNTER — Other Ambulatory Visit: Payer: Self-pay | Admitting: *Deleted

## 2014-09-27 ENCOUNTER — Other Ambulatory Visit (INDEPENDENT_AMBULATORY_CARE_PROVIDER_SITE_OTHER): Payer: Medicare Other | Admitting: *Deleted

## 2014-09-27 DIAGNOSIS — E785 Hyperlipidemia, unspecified: Secondary | ICD-10-CM

## 2014-09-27 DIAGNOSIS — I6523 Occlusion and stenosis of bilateral carotid arteries: Secondary | ICD-10-CM | POA: Insufficient documentation

## 2014-09-27 LAB — HEPATIC FUNCTION PANEL
ALBUMIN: 4.3 g/dL (ref 3.5–5.2)
ALT: 52 U/L — AB (ref 0–35)
AST: 39 U/L — ABNORMAL HIGH (ref 0–37)
Alkaline Phosphatase: 164 U/L — ABNORMAL HIGH (ref 39–117)
BILIRUBIN DIRECT: 0.2 mg/dL (ref 0.0–0.3)
TOTAL PROTEIN: 7.9 g/dL (ref 6.0–8.3)
Total Bilirubin: 0.6 mg/dL (ref 0.2–1.2)

## 2014-09-27 LAB — LIPID PANEL
Cholesterol: 89 mg/dL (ref 0–200)
HDL: 42.4 mg/dL (ref >39.00)
NONHDL: 46.6
Total CHOL/HDL Ratio: 2
Triglycerides: 228 mg/dL — ABNORMAL HIGH (ref 0.0–149.0)
VLDL: 45.6 mg/dL — ABNORMAL HIGH (ref 0.0–40.0)

## 2014-09-27 LAB — LDL CHOLESTEROL, DIRECT: LDL DIRECT: 25 mg/dL

## 2014-09-27 MED ORDER — METOPROLOL TARTRATE 50 MG PO TABS: 75.0000 mg | ORAL_TABLET | Freq: Two times a day (BID) | ORAL | Status: DC

## 2014-09-27 MED ORDER — ISOSORBIDE MONONITRATE ER 60 MG PO TB24: 90.0000 mg | ORAL_TABLET | Freq: Every day | ORAL | Status: DC

## 2014-09-27 NOTE — Addendum Note (Signed)
Addended by: Eulis Foster on: 09/27/2014 08:24 AM   Modules accepted: Orders

## 2014-09-27 NOTE — Progress Notes (Signed)
Carotid duplex performed 

## 2014-09-28 ENCOUNTER — Encounter: Payer: BC Managed Care – PPO | Admitting: Internal Medicine

## 2014-10-01 ENCOUNTER — Telehealth: Payer: Self-pay | Admitting: Cardiology

## 2014-10-01 NOTE — Telephone Encounter (Signed)
New problem   Pt stated she was told by vm to call office but no name was giving of person calling and no reason for the call. Please call pt.

## 2014-10-01 NOTE — Telephone Encounter (Signed)
Spoke with patient about recent carotid result

## 2014-10-07 ENCOUNTER — Ambulatory Visit (INDEPENDENT_AMBULATORY_CARE_PROVIDER_SITE_OTHER): Payer: Medicare Other | Admitting: Cardiology

## 2014-10-07 ENCOUNTER — Encounter: Payer: Self-pay | Admitting: *Deleted

## 2014-10-07 ENCOUNTER — Other Ambulatory Visit: Payer: Self-pay | Admitting: *Deleted

## 2014-10-07 ENCOUNTER — Encounter: Payer: Self-pay | Admitting: Cardiology

## 2014-10-07 VITALS — BP 146/78 | HR 72 | Ht 62.0 in | Wt 126.0 lb

## 2014-10-07 DIAGNOSIS — E109 Type 1 diabetes mellitus without complications: Secondary | ICD-10-CM | POA: Insufficient documentation

## 2014-10-07 DIAGNOSIS — I5032 Chronic diastolic (congestive) heart failure: Secondary | ICD-10-CM

## 2014-10-07 DIAGNOSIS — I495 Sick sinus syndrome: Secondary | ICD-10-CM

## 2014-10-07 DIAGNOSIS — I350 Nonrheumatic aortic (valve) stenosis: Secondary | ICD-10-CM

## 2014-10-07 DIAGNOSIS — D509 Iron deficiency anemia, unspecified: Secondary | ICD-10-CM

## 2014-10-07 DIAGNOSIS — I25810 Atherosclerosis of coronary artery bypass graft(s) without angina pectoris: Secondary | ICD-10-CM

## 2014-10-07 DIAGNOSIS — I480 Paroxysmal atrial fibrillation: Secondary | ICD-10-CM

## 2014-10-07 DIAGNOSIS — I25110 Atherosclerotic heart disease of native coronary artery with unstable angina pectoris: Secondary | ICD-10-CM

## 2014-10-07 DIAGNOSIS — E785 Hyperlipidemia, unspecified: Secondary | ICD-10-CM

## 2014-10-07 LAB — CBC WITH DIFFERENTIAL/PLATELET
BASOS PCT: 0.5 % (ref 0.0–3.0)
Basophils Absolute: 0 10*3/uL (ref 0.0–0.1)
Eosinophils Absolute: 0.3 10*3/uL (ref 0.0–0.7)
Eosinophils Relative: 2.7 % (ref 0.0–5.0)
HCT: 38.6 % (ref 36.0–46.0)
Hemoglobin: 13.1 g/dL (ref 12.0–15.0)
LYMPHS ABS: 1.6 10*3/uL (ref 0.7–4.0)
LYMPHS PCT: 15.2 % (ref 12.0–46.0)
MCHC: 33.8 g/dL (ref 30.0–36.0)
MCV: 87.4 fl (ref 78.0–100.0)
MONO ABS: 0.6 10*3/uL (ref 0.1–1.0)
MONOS PCT: 6.2 % (ref 3.0–12.0)
NEUTROS PCT: 75.4 % (ref 43.0–77.0)
Neutro Abs: 7.9 10*3/uL — ABNORMAL HIGH (ref 1.4–7.7)
PLATELETS: 158 10*3/uL (ref 150.0–400.0)
RBC: 4.41 Mil/uL (ref 3.87–5.11)
RDW: 14.6 % (ref 11.5–15.5)
WBC: 10.4 10*3/uL (ref 4.0–10.5)

## 2014-10-07 LAB — BASIC METABOLIC PANEL
BUN: 30 mg/dL — ABNORMAL HIGH (ref 6–23)
CHLORIDE: 99 meq/L (ref 96–112)
CO2: 32 mEq/L (ref 19–32)
Calcium: 10.3 mg/dL (ref 8.4–10.5)
Creatinine, Ser: 0.96 mg/dL (ref 0.40–1.20)
GFR: 62.7 mL/min (ref >60.00)
GLUCOSE: 154 mg/dL — AB (ref 70–99)
Potassium: 3.9 mEq/L (ref 3.5–5.1)
SODIUM: 138 meq/L (ref 135–145)

## 2014-10-07 LAB — BRAIN NATRIURETIC PEPTIDE: Pro B Natriuretic peptide (BNP): 47 pg/mL (ref 0.0–100.0)

## 2014-10-07 MED ORDER — POTASSIUM CHLORIDE CRYS ER 20 MEQ PO TBCR: 20.0000 meq | EXTENDED_RELEASE_TABLET | Freq: Two times a day (BID) | ORAL | Status: DC

## 2014-10-07 MED ORDER — PRAVASTATIN SODIUM 10 MG PO TABS: ORAL_TABLET | ORAL | Status: DC

## 2014-10-07 MED ORDER — FUROSEMIDE 80 MG PO TABS: 80.0000 mg | ORAL_TABLET | Freq: Every day | ORAL | Status: DC

## 2014-10-07 MED ORDER — TICAGRELOR 90 MG PO TABS: 90.0000 mg | ORAL_TABLET | Freq: Two times a day (BID) | ORAL | Status: DC

## 2014-10-07 MED ORDER — ISOSORBIDE MONONITRATE ER 60 MG PO TB24: 60.0000 mg | ORAL_TABLET | Freq: Every day | ORAL | Status: DC

## 2014-10-07 NOTE — Patient Instructions (Addendum)
Decrease Imdur (isosorbide) to 81m daily.  Your physician recommends that you have lab work today--BMET/CBCd/BNP.  Your physician recommends that you return for lab work in: 1 month--liver profile.  Your physician wants you to follow-up in: 6 months with Dr MAundra Dubin (September 2016).  You will receive a reminder letter in the mail two months in advance. If you don't receive a letter, please call our office to schedule the follow-up appointment.

## 2014-10-07 NOTE — Progress Notes (Signed)
Patient ID: [name] [name], female   DOB: 08-Oct-1950, 64 y.o.   MRN: [id number] PCP: Dr. Orson Ape  64 yo with history of CAD s/p CABG in 12/05 and redo in 1/11, bioprosthetic AVR, diastolic CHF, and sick sinus syndrome s/p PCM presents for cardiology followup.  She had initial CABG with SVG-RCA in 12/05 followed by redo SVG-RCA and bioprosthetic AVR in 1/11.  She has had diastolic CHF and is on Lasix.  In 9/14, she had a CPX test.  This showed severe functional limitation with ischemic ECG changes (inferolateral ST depression and ST elevation in V1 and V2). She had chest pain.  I took her for [facility] in 9/14.  This showed elevated left and right heart filling pressures, patent SVG-PDA, and 80% distal LM/80% ostial LAD stenosis.  Patient was deemed not a candidate for redo CABG (had 2 prior sternotomies as well as chest radiation for Hodgkins lymphoma).  She had DES from left main into proximal LAD and will need long-term DAPT => Brilinta was used as she has been a poor responder to Plavix in the past.  She re-developed angina in 11/15 and was admitted with unstable angina.  LHC showed 80% ostial LM and patent SVG-RCA. She had DES to ostial LM. Last echo in 11/15 showed EF 55-60%, normal bioprosthetic aortic valve, mild-moderate MR, moderate TR.  Since that time, she has had no more chest pain. She completed cardiac re[facility] at [facility]  No exertional dyspnea unless she is walking up a hill.    She has chronic iron deficiency anemia thought to be due to a slow GI bleed.  Last EGD was unremarkable and a capsule endoscopy was also unremarkable.  She has periodic transfusions; However, recently hemoglobin has been stable.  No BRBPR.   She has an insulin pump followed by an endocrinologist at [facility].   Given myalgias with statins, she was started on Praluent.  LDL is now excellent.  Of note, transaminases have risen a bit.   After last appointment, patient developed severe right leg pain especially with  ambulation.  Peripheral arterial dopplers were suggestive of right external iliac stenosis.  There was concern for possible complication from recent cardiac cath.  She then had repeat peripheral arterial dopplers a few months later in 3/16.  ABIs were normal.  CTA abdomen/pelvis showed possible mild dissection at the CFA/EIA confluence on the right (not flow-limiting).  Her leg pain resolved.    ECG: NSR with profound 1st degree AV block, LVH  Labs (7/14): hemoglobin 11 Labs (9/14): K 3.9, creatinine 1.48, hemoglobin 7.3 Labs (10/14): hemoglobin 7.7 Labs (11/14): K 3.8, creatinine 1.5 Labs (12/14): HCT 37.7 Labs (1/15): LDL 106, HDL 41, K 3.9, creatinine 1.6, LFTs normal Labs (2/15): K 3.7, creatinine 1.1, BNP 71 Labs (3/15): HCT 36.7 Labs (4/15): K 3.7, creatinine 1.1, HCT 35.5 Labs (7/15): HCT 36.4 Labs (11/15): HCT 32.1, K 4.2, creatinine 1.06 Labs (12/15): hgb 12 Labs (1/16): HCT 37.1 Labs (3/16): LDL 25, HDL 42, elevated transaminases  PMH: 1. Sick sinus syndrome s/p [employer] PCM.  2. HTN 3. H/o SBO 4. Renal cell carcinoma s/p right nephrectomy in 2008.  5. 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  Fibroscan in 11/14 showed advanced fibrosis concerning for cirrhosis. EGD in 11/14 showed no varices.  6. Diastolic CHF: Echo (4/76) with EF 60-65%, bioprosthetic aortic valve with mean gradient 15 mmHg, mild MR.  CPX (9/14): peak VO2 10.4, VE/VCO2 43.8, inferolateral ST depression and ST elevation in V1/V2 with severe  dyspnea and chest pain, severe functional limitation with ischemic changes.  Echo (9/14) with EF 60-65%, normal RV size and systolic function, mild-moderate MR, bioprosthetic aortic valve normal, PA systolic pressure 51 mmHg, no evidence for pericardial constriction though exam incomplete for constriction.  Echo (11/15) with EF 55-60%, bioprosthetic aortic valve, mild-moderate MR, moderate TR, PA systolic pressure 42 mmHg.  7. TAH-BSO 8. Type II diabetes: Has insulin pump.  9.  H/o ischemic colitis. 10. H/o PUD. 11. Diabetic gastroparesis. 12. Hyperlipidemia: Myalgias with Crestor and atorvastatin, elevated LFTs with simvastatin.  Myalgias with > 10 mg pravastatin.  13. CKD 14. Bicuspid aortic valve s/p 19 mm Edwards pericardial valve in 1/11 (had post-operative Dresslers syndrome).   15. CAD: CABG with SVG-RCA in 12/05.  Redo SVG-RCA in 1/11 with AVR.  LHC/RHC (9/14) with mean RA 18, PA 62/25 mean 43, mean PCWP 26, CI 2.8, 70-80% distal LM stenosis, 80% ostial LAD stenosis, total occlusion RCA, SVG-PDA patent.  Patient had DES LM into LAD.  Plan to continue long-term ASA/Brilinta (poor responder to Plavix).  ETT-cardiolite (4/15) with 6' exercise, EF 60%, ST depression in recovery and mild chest pain, no ischemia or infarction by perfusion images.  Unstable angina 11/15 with 80% LM, patent SVG-RCA => DES to ostial LM.   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  17. Anemia: Iron-deficiency.  Suspect chronic GI blood loss.  EGD in 9/14 was unremarkable.  Capsule endoscopy 10/14 was unremarkable.  Has required periodic blood transfusions.  18. Atrial fibrillation: Paroxysmal, noted by [facility] interrogation.  19. Carotid stenosis: Carotid dopplers (2/15) with 40-59% bilateral stenosis. Carotid dopplers (3/16) with 40-59% bilateral ICA stenosis.  20. 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   SH: Married, lives in [place], nonsmoker.   FH: CAD  ROS: All systems reviewed and negative except as per HPI.   Current Outpatient Prescriptions  Medication Sig Dispense Refill  . Alirocumab 75 MG/ML SOPN Inject 75 mg into the skin every 14 (fourteen) days.    . ALPRAZolam (XANAX) 0.5 MG tablet Take 0.25-0.5 mg by mouth 2 (two) times daily as needed for anxiety.     Marland Kitchen aspirin EC 81 MG tablet Take 81 mg by mouth at bedtime.    Marland Kitchen azithromycin (ZITHROMAX) 500 MG tablet 1 tablet  by mouth one hour prior to dental appointment/procedure 2 tablet 0  . Calcium Carbonate-Vitamin D (RA CALCIUM PLUS VITAMIN D) 600-400 MG-UNIT per tablet Take 1 tablet by mouth 2 (two) times daily.     . cholecalciferol (VITAMIN D) 1000 UNITS tablet Take 1,000 Units by mouth daily.     . clobetasol (TEMOVATE) 0.05 % external solution Apply 1 application topically daily as needed (dermatosis).    Marland Kitchen desoximetasone (TOPICORT) 0.05 % cream Apply 1 application topically 2 (two) times daily as needed. When skin starts peeling    . diclofenac sodium (VOLTAREN) 1 % GEL Apply topically as needed.     . docusate sodium (COLACE) 100 MG capsule Take 1 capsule (100 mg total) by mouth 2 (two) times daily. 60 capsule 3  . Estradiol 10 MCG TABS vaginal tablet As directed    . ferrous fumarate (HEMOCYTE - 106 MG FE) 325 (106 FE) MG TABS tablet Take 1 tablet (106 mg of iron total) by mouth 2 (two) times daily. Or as directed. Take with Vitamin -C (Patient taking differently: Take 1 tablet by mouth 2 (two) times a week. Or as  directed. Take with Vitamin -C) 100 each 1  . fluticasone (FLONASE) 50 MCG/ACT nasal spray Place 2 sprays into the nose daily as needed for rhinitis. For nasal congestion    . glucagon (GLUCAGON EMERGENCY) 1 MG injection Inject 1 mg into the vein once as needed (for severe reaction).    Marland Kitchen HM OMEPRAZOLE 20 MG TBEC TAKE ONE TABLET TWICE DAILY 84 each 1  . Insulin Human (INSULIN PUMP) 100 unit/ml SOLN Inject into the skin continuous. Apidra Basal Rate midnight-0700 : 0.55 700-830: 2.25 830- 1930: 2.5 1930-midnight: 3.2    . Investigational - Study Medication Inject into the skin. [facility] STUDY Alirocumab 75 mg/mL (NON-IDS) -eIRB # [id number] Pre-filled syringe    . Magnesium 250 MG TABS Take 250 mg by mouth daily.     . metoprolol (LOPRESSOR) 50 MG tablet Take 1.5 tablets (75 mg total) by mouth 2 (two) times daily. 270 tablet 0  . Multiple Vitamins-Minerals (MULTIVITAMIN WITH MINERALS) tablet Take 1 tablet  by mouth daily.      . nitroGLYCERIN (NITROLINGUAL) 0.4 MG/SPRAY spray Place 1 spray under the tongue every 5 (five) minutes as needed for chest pain. 12 g 1  . ONE TOUCH ULTRA TEST test strip     . polyethylene glycol (MIRALAX / GLYCOLAX) packet Take 8.5 g by mouth daily as needed. For constipation    . potassium chloride SA (K-DUR,KLOR-CON) 20 MEQ tablet Take 1 tablet (20 mEq total) by mouth 2 (two) times daily. 180 tablet 3  . pravastatin (PRAVACHOL) 10 MG tablet TAKE ONE (1) TABLET BY MOUTH EVERY DAY 90 tablet 3  . ticagrelor (BRILINTA) 90 MG TABS tablet Take 1 tablet (90 mg total) by mouth 2 (two) times daily. 180 tablet 5  . traMADol (ULTRAM) 50 MG tablet As directed    . vitamin C (ASCORBIC ACID) 500 MG tablet Take 250 mg by mouth daily.     . Wheat Dextrin (BENEFIBER PO) Take 45 mLs by mouth every morning. 3 tablespoons each morning    . furosemide (LASIX) 80 MG tablet Take 1 tablet (80 mg total) by mouth daily. 90 tablet 3  . isosorbide mononitrate (IMDUR) 60 MG 24 hr tablet Take 1 tablet (60 mg total) by mouth daily. 90 tablet 3   No current facility-administered medications for this visit.    BP 146/78 mmHg  Pulse 72  Ht 5' 2"  (1.575 m)  Wt 126 lb (57.153 kg)  BMI 23.04 kg/m2 General: NAD Neck: JVP 7 cm, no thyromegaly or thyroid nodule.  Lungs: Clear to auscultation bilaterally with normal respiratory effort. CV: Nondisplaced PMI.  Heart regular S1/S2, no S3/S4, 2/6 early systolic murmur RUSB.  No peripheral edema.  Left carotid bruit.  2+ PT pulses bilaterally.  Abdomen: Soft, nontender, no hepatosplenomegaly, no distention.  Skin: Intact without lesions or rashes.  Neurologic: Alert and oriented x 3.  Psych: Normal affect. Extremities: No clubbing or cyanosis.   Assessment/Plan: 1. CAD: s/p SVG-RCA in 12/05, then redo SVG-RCA in 1/11.  She was found to have distal left main/proximal LAD severe stenosis in 9/14.  She was very symptomatic.  PCI with DES to distal  LM/proximal LAD was done rather than bypass as she would have been a poor candidate for redo sternotomy (2 prior sternotomies and history of chest wall radiation for Hodgkins disease).  Recurrent unstable angina with 80% ostial LM treated with DES in 11/15.  No chest pain since that time.  She will need to be on Brilinta and ASA (  poor responder to plavix) long-term, can decrease Brilinta to 60 mg bid in 11/16.  This is complicated by suspected slow GI bleed. Hemoglobin stable recently.  - Continue ASA 81, statin, metoprolol, Brilinta, Imdur without changes.    2. Bioprosthetic aortic valve replacement: Stable on last echo.  3. Diastolic CHF: Euvolemic on exam, EF normal on 11/15 echo. Continue current Lasix. 4. CKD:  Patient has a single kidney s/p right nephrectomy.  She has been referred to nephrology. Creatinine has been stable. BMET today.  5. Hyperlipidemia: She has not been able to tolerate any higher potency statin than pravastatin 10 mg daily.  She is now on Praluent with excellent LDL.  However, mild elevation in transaminases recently.  Will get LFTs next month again.  6. Anemia: Suspected slow GI bleed.  Unremarkable EGD and capsule endoscopy in fall 2014.  Unfortunately, needs to stay on Brilinta long-term.  Will need to follow closely (sees hematology), plan for transfusion with hemoglobin < 8 as this allows best control of her symptoms. Check CBC today.   7. Atrial fibrillation: Paroxysmal, brief episodes noted on PCM interrogation.  Given GI bleeding and need to be on both ASA 81 and Brilinta, risks likely outweigh benefits of anticoagulation.  8. Carotid bruit: Mild to moderate bilateral stenosis.  Repeat in 1 year (3/17). 9. SSS: Pacemaker in place.  10. 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    [name] 10/07/2014

## 2014-10-18 ENCOUNTER — Telehealth: Payer: Self-pay | Admitting: Pharmacist

## 2014-10-18 NOTE — Telephone Encounter (Signed)
Pt called on 3/25 to report an injection site reaction with Praluent.  She states she gave herself her normal injection on 3/23 and on 3/25 she noticed her leg was hot and had a visibly red area bigger than her hand.  She has used ice but that hasn't seemed to make a difference.  It was slightly itchy and hurt worse with movement.  I had patient send me a picture of her leg since she lives >45 min from the office.  Agree with size and color of leg.  Spoke with representative from [employer].  There was no particular treatment regimen used for injection site reactions during the trial.  Unlikely she has developed antibodies to the medication given she is still having an LDL lowering effect from the medication- this would not occur if she had antibodies.  Have asked her to try a steroid cream and benadryl and mark the boarders of the redness.  If it becomes worse over the weekend, she should go to urgent care.

## 2014-10-18 NOTE — Telephone Encounter (Signed)
Followed up with patient this morning.  She states the area is slightly smaller than the outline on Friday and is now bluish and bruised.  It is tender to the the touch but no longer hot and she is able to walk better.  She did mention she had an episode of CHF yesterday afternoon but she was able to take an extra lasix tablet and control her oxygen levels.   Plan to hold Praluent x 2 doses then restart.  Unlikely that the reaction will occur again.  ? If it was accidentally given IM versus SQ given extent of bruising.

## 2014-10-20 ENCOUNTER — Other Ambulatory Visit: Payer: Self-pay

## 2014-10-20 DIAGNOSIS — D649 Anemia, unspecified: Secondary | ICD-10-CM

## 2014-10-20 MED ORDER — FERROUS FUMARATE 325 (106 FE) MG PO TABS: 1.0000 | ORAL_TABLET | Freq: Two times a day (BID) | ORAL | Status: DC

## 2014-10-22 ENCOUNTER — Other Ambulatory Visit: Payer: Self-pay | Admitting: Oncology

## 2014-10-22 DIAGNOSIS — D509 Iron deficiency anemia, unspecified: Secondary | ICD-10-CM

## 2014-10-25 ENCOUNTER — Other Ambulatory Visit (HOSPITAL_BASED_OUTPATIENT_CLINIC_OR_DEPARTMENT_OTHER): Payer: Medicare Other

## 2014-10-25 ENCOUNTER — Encounter: Payer: Self-pay | Admitting: Oncology

## 2014-10-25 ENCOUNTER — Telehealth: Payer: Self-pay | Admitting: Oncology

## 2014-10-25 ENCOUNTER — Ambulatory Visit (HOSPITAL_BASED_OUTPATIENT_CLINIC_OR_DEPARTMENT_OTHER): Payer: Medicare Other | Admitting: Oncology

## 2014-10-25 DIAGNOSIS — D500 Iron deficiency anemia secondary to blood loss (chronic): Secondary | ICD-10-CM

## 2014-10-25 DIAGNOSIS — D509 Iron deficiency anemia, unspecified: Secondary | ICD-10-CM

## 2014-10-25 DIAGNOSIS — Z8571 Personal history of Hodgkin lymphoma: Secondary | ICD-10-CM

## 2014-10-25 DIAGNOSIS — E119 Type 2 diabetes mellitus without complications: Secondary | ICD-10-CM | POA: Diagnosis not present

## 2014-10-25 DIAGNOSIS — E039 Hypothyroidism, unspecified: Secondary | ICD-10-CM

## 2014-10-25 DIAGNOSIS — C641 Malignant neoplasm of right kidney, except renal pelvis: Secondary | ICD-10-CM

## 2014-10-25 DIAGNOSIS — Z862 Personal history of diseases of the blood and blood-forming organs and certain disorders involving the immune mechanism: Secondary | ICD-10-CM | POA: Diagnosis not present

## 2014-10-25 DIAGNOSIS — Z85528 Personal history of other malignant neoplasm of kidney: Secondary | ICD-10-CM | POA: Diagnosis not present

## 2014-10-25 DIAGNOSIS — Z1231 Encounter for screening mammogram for malignant neoplasm of breast: Secondary | ICD-10-CM

## 2014-10-25 DIAGNOSIS — T66XXXD Radiation sickness, unspecified, subsequent encounter: Secondary | ICD-10-CM

## 2014-10-25 LAB — CBC WITH DIFFERENTIAL/PLATELET
BASO%: 1.1 % (ref 0.0–2.0)
Basophils Absolute: 0.1 10*3/uL (ref 0.0–0.1)
EOS ABS: 0.2 10*3/uL (ref 0.0–0.5)
EOS%: 2.8 % (ref 0.0–7.0)
HCT: 39.9 % (ref 34.8–46.6)
HGB: 12.8 g/dL (ref 11.6–15.9)
LYMPH%: 16.3 % (ref 14.0–49.7)
MCH: 28.8 pg (ref 25.1–34.0)
MCHC: 32.2 g/dL (ref 31.5–36.0)
MCV: 89.5 fL (ref 79.5–101.0)
MONO#: 0.4 10*3/uL (ref 0.1–0.9)
MONO%: 4.9 % (ref 0.0–14.0)
NEUT#: 6.5 10*3/uL (ref 1.5–6.5)
NEUT%: 74.9 % (ref 38.4–76.8)
Platelets: 150 10*3/uL (ref 145–400)
RBC: 4.46 10*6/uL (ref 3.70–5.45)
RDW: 14.4 % (ref 11.2–14.5)
WBC: 8.7 10*3/uL (ref 3.9–10.3)
lymph#: 1.4 10*3/uL (ref 0.9–3.3)

## 2014-10-25 LAB — IRON AND TIBC CHCC
%SAT: 23 % (ref 21–57)
Iron: 68 ug/dL (ref 41–142)
TIBC: 290 ug/dL (ref 236–444)
UIBC: 222 ug/dL (ref 120–384)

## 2014-10-25 NOTE — Progress Notes (Signed)
OFFICE PROGRESS NOTE   October 25, 2014   Physicians:W.McGough, [name] (endocrine [facility]), [name], D.McLean, G.Lovena Le, B.Bartle, A.Deal (GI [facility])  INTERVAL HISTORY:  Patient is seen, alone for visit, in follow up of previous iron deficiency anemia, thought particularly related to slow GI blood loss in setting of chronic Brilinta and ASA. She continues oral ferrous fumarate,with last feraheme 05-2013 and last PRBCs 04-2013.  She has remote history of Hodgkins treated with mantle radiation in 1980s and right nephrectomy for renal cell ca 2008.  Significant comorbid conditions include ACD with prior CABG then redo CABG, aortic stenosis post aortic valve 2011, SSS with pacer,DES left main to proximal LAD and DES ostial LM 2015 such that she requires long term Brilinta and ASA. She is followed closely by Dr [name]. She has insulin dependent diabetes followed by Dr [name] at [facility] as well as insulin pump management thru [facility], and  hypothyroidism related to RT for Hodgkins also followed by Dr Frederik Pear. She is to see Dr Frederik Pear again in June. She had ischemic colitis with rectal bleeding 03-2013 . She has some renal insufficiency with single kidney, last UA in this EMR negative for blood 04-2013. She had vascular evaluation in [place] and at [facility] 07-2014 due to RLE pain, thought likely complication of the last cardiac cath. The symptoms resolved during evaluation and did not require intervention. Most recent CT AP was at [facility] with this evaluation.  From CTs at [facility] 2012 in this EMR, she had hernia of right internal oblique abdominal musculature containing some proximal colon  Patient has felt well overall recently, with exception of episode of abdominal distension and SOB on Easter Sunday, resolved with additional lasix; she may have had more salt in foods that day, O2 sats down to 80s when symptomatic. Bowels are moving normally, no noted bleeding. She has had some  difficulty finding ferrous fumarate, would be ok to use ferrous gluconate if needed (not ferrous sulfate).     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 She has radiation induced hypothyroidism and the treatment caused or worsened coronary artery disease. She has had no further active Hodgkins.  Oncologic history is also remarkable for right nephrectomy for renal cell carcinoma 2008 (T1bNx chromophobe variant Fuhrman Nuclear Grade III-IV with negative margins, no renal vein invasion, no extension thru capsule at right nephrectomy 06-11-2007 by [name], path [id number]). She is no longer followed regularly by urology.       Review of systems as above, also: No recent fevers or symptoms of infection. No other respiratory symptoms. No noted changes in breasts. No LE swelling. Avoiding upper abdomen for insulin pump due to poor absorption there from scarring. Pain at right costal margin when she leans forward, x several years but more intense now such that she is careful to avoid that positioning. She continues PT at [facility], which has been very helpful for muscle spasm in RLE and to strengthen core. Remainder of 10 point Review of Systems negative.  Objective:  Vital signs in last 24 hours: weight down 0.5 lb to 125 lb 6 oz. 146/58, 78 regular, 18 not labored RA, 97.6, 98% sat   Alert, oriented and appropriate. Ambulatory without difficulty. Looks comfortable, talkative and appears energetic, excellent  and detailed historian as always.   HEENT:PERRL, sclerae not icteric. Oral mucosa moist without lesions, posterior pharynx clear.  Neck supple. No JVD.  Lymphatics:no cervical,supraclavicular, axillary or inguinal adenopathy Resp: clear to  auscultation bilaterally and normal percussion bilaterally Cardio: regular rate and rhythm. No gallop. GI: soft, nontender, not distended, no mass or organomegaly. Buldging RLQ consistent with the right internal oblique hernia. Area of tenderness described is just below mid right costal margin, nothing appreciated there. Normally active bowel sounds. Surgical incision not remarkable. Musculoskeletal/ Extremities: without pitting edema, cords, tenderness Neuro: no focal deficits. PSYCH appropriate mood and affect. Skin without rash, ecchymosis, petechiae. No irritation at multiple puncture sites for insulin pump anterior abdomen.  Breasts: without dominant mass, skin or nipple findings. Axillae benign. Pacer situated well above left breast.  Lab Results:  Results for orders placed or performed in visit on 84/13/24  Basic metabolic panel  Result Value Ref Range   Sodium 138 135 - 145 mEq/L   Potassium 3.9 3.5 - 5.1 mEq/L   Chloride 99 96 - 112 mEq/L   CO2 32 19 - 32 mEq/L   Glucose, Bld 154 (H) 70 - 99 mg/dL   BUN 30 (H) 6 - 23 mg/dL   Creatinine, Ser 0.96 0.40 - 1.20 mg/dL   Calcium 10.3 8.4 - 10.5 mg/dL   GFR 62.70 >60.00 mL/min  CBC with Differential/Platelet  Result Value Ref Range   WBC 10.4 4.0 - 10.5 K/uL   RBC 4.41 3.87 - 5.11 Mil/uL   Hemoglobin 13.1 12.0 - 15.0 g/dL   HCT 38.6 36.0 - 46.0 %   MCV 87.4 78.0 - 100.0 fl   MCHC 33.8 30.0 - 36.0 g/dL   RDW 14.6 11.5 - 15.5 %   Platelets 158.0 150.0 - 400.0 K/uL   Neutrophils Relative % 75.4 43.0 - 77.0 %   Lymphocytes Relative 15.2 12.0 - 46.0 %   Monocytes Relative 6.2 3.0 - 12.0 %   Eosinophils Relative 2.7 0.0 - 5.0 %   Basophils Relative 0.5 0.0 - 3.0 %   Neutro Abs 7.9 (H) 1.4 - 7.7 K/uL   Lymphs Abs 1.6 0.7 - 4.0 K/uL   Monocytes Absolute 0.6 0.1 - 1.0 K/uL   Eosinophils Absolute 0.3 0.0 - 0.7 K/uL   Basophils Absolute 0.0 0.0 - 0.1 K/uL  Brain natriuretic peptide  Result Value Ref Range   Pro B Natriuretic  peptide (BNP) 47.0 0.0 - 100.0 pg/mL   *Note: Due to a large number of results and/or encounters for the requested time period, some results have not been displayed. A complete set of results can be found in Results Review.     Labs from today: CBC with WBC 8.7, ANC 6.5, Hgb 12.8, MCV 89.5, plt 150, differential not remarkable Iron studies available after visit with serum iron 68 and %sat 23  Studies/Results:  No results found. Due mammograms at [facility] Aug 2016 [facility] 2010 reviewed, copy of report given to patient to take to [facility].  Medications: I have reviewed the patient's current medications. Continue ferrous fumarate or ferrous gluconate.  DISCUSSION: as she has been stable from hematologic standpoint for over a year,and as she has multiple physician visits in interim, will schedule next visit here spring 2017. She knows that she can call at any time if concerns prior.   Assessment/Plan:  1.Previous anemia related to intermittent GI bleeding in patient on necessary long term brilinta and ASA for CAD. Hemoglobin good today. Follow up as  above. Note needs lasix if PRBCs. 2.IIA nodular sclerosing Hodgkins treated with mantle radiation in 1980s.  3.post right nephrectomy for renal cell carcinoma 2008, not known recurrent. Last UA without blood.  4.Cardiac disease: CAD post RCA vein graft and redo CABG 2011, aortic stenosis post aortic valve 2011, sinus node dysfunction with brief episodes PAF, dual chamber pacer Jan 2014. Post stent to LM into proximal LAD for progressive involvement 04-2013 and PTCA/ DES LM 05-2014, Not candidate for another surgery per CVTS. Angina resolved with recent procedure 5. Insulin dependent diabetes: followed by Dr [name] at [facility], on insulin pump.  6.hypothyroidism post RT, on replacement, Dr Frederik Pear also following thyroid US  7.rectal bleeding ~ 12-2012 thought from ischemic colitis. Post colonoscopy 06-2012 with no polyps.  8.renal  insufficiency: single kidney 9.gastroparesis related to DM: improved with interventions including dietary changes thru [facility] 10. Amicar mouthwash very helpful for gum bleeding after dental procedure; she could try this topically if skin scratches bleed excessively. 11.RLE pain related to cath, resolved without vascular intervention, and related to muscle spasm being addressed with outpatient PT 12.increased risk for breast cancer due to previous mantle radiation. Mammograms [facility] 02-22-14 as above, repeat 1 year.    All questions answered. Time spent 25 min including >50% counseling and coordination of care.  Patient understands that she can be seen at any point earlier than scheduled visit if she or other MDs feel that I can be of help.  [name] [name] P, MD   10/25/2014, 10:10 AM

## 2014-10-25 NOTE — Telephone Encounter (Signed)
Called and left a message that we will call for her one year appointment,she will also need to call [facility] to schedule her mammo due to high risk only

## 2014-10-26 ENCOUNTER — Telehealth: Payer: Self-pay

## 2014-10-26 NOTE — Telephone Encounter (Signed)
-----   Message from [name], MD sent at 10/26/2014  8:28 AM EDT ----- Labs seen and need follow up: please let her know iron studies are in low normal range, better than last Nov. Continue oral ferrous fumarate or ferrous gluconate.

## 2014-10-26 NOTE — Telephone Encounter (Signed)
Spoke with [name] [name] and told her the results of the iron studies as noted below by Dr. Marko Plume.

## 2014-10-27 DIAGNOSIS — Z8571 Personal history of Hodgkin lymphoma: Secondary | ICD-10-CM | POA: Insufficient documentation

## 2014-10-27 DIAGNOSIS — T66XXXA Radiation sickness, unspecified, initial encounter: Secondary | ICD-10-CM | POA: Insufficient documentation

## 2014-11-02 ENCOUNTER — Encounter: Payer: Self-pay | Admitting: Internal Medicine

## 2014-11-02 ENCOUNTER — Ambulatory Visit (INDEPENDENT_AMBULATORY_CARE_PROVIDER_SITE_OTHER): Payer: Medicare Other | Admitting: Internal Medicine

## 2014-11-02 ENCOUNTER — Other Ambulatory Visit: Payer: Medicare Other

## 2014-11-02 VITALS — BP 132/64 | HR 41 | Ht 63.0 in | Wt 128.4 lb

## 2014-11-02 DIAGNOSIS — I4891 Unspecified atrial fibrillation: Secondary | ICD-10-CM | POA: Diagnosis not present

## 2014-11-02 DIAGNOSIS — Z950 Presence of cardiac pacemaker: Secondary | ICD-10-CM | POA: Diagnosis not present

## 2014-11-02 DIAGNOSIS — I495 Sick sinus syndrome: Secondary | ICD-10-CM

## 2014-11-02 DIAGNOSIS — I5032 Chronic diastolic (congestive) heart failure: Secondary | ICD-10-CM | POA: Diagnosis not present

## 2014-11-02 DIAGNOSIS — I10 Essential (primary) hypertension: Secondary | ICD-10-CM

## 2014-11-02 LAB — MDC_IDC_ENUM_SESS_TYPE_INCLINIC
Battery Voltage: 2.8 V
Brady Statistic AP VP Percent: 0 %
Brady Statistic AP VS Percent: 0 %
Brady Statistic AS VP Percent: 0 %
Brady Statistic AS VS Percent: 100 %
Date Time Interrogation Session: 20160412112519
Lead Channel Impedance Value: 637 Ohm
Lead Channel Pacing Threshold Amplitude: 0.75 V
Lead Channel Pacing Threshold Amplitude: 0.75 V
Lead Channel Pacing Threshold Pulse Width: 0.4 ms
Lead Channel Pacing Threshold Pulse Width: 0.4 ms
Lead Channel Sensing Intrinsic Amplitude: 22.4 mV
Lead Channel Setting Pacing Amplitude: 2 V
Lead Channel Setting Sensing Sensitivity: 5.6 mV
MDC IDC MSMT BATTERY IMPEDANCE: 136 Ohm
MDC IDC MSMT BATTERY REMAINING LONGEVITY: 164 mo
MDC IDC MSMT LEADCHNL RA IMPEDANCE VALUE: 467 Ohm
MDC IDC MSMT LEADCHNL RA SENSING INTR AMPL: 2.8 mV
MDC IDC SET LEADCHNL RA PACING AMPLITUDE: 1.5 V
MDC IDC SET LEADCHNL RV PACING PULSEWIDTH: 0.4 ms

## 2014-11-02 NOTE — Patient Instructions (Signed)
Your physician wants you to follow-up in: 12 months with Dr Knox Saliva will receive a reminder letter in the mail two months in advance. If you don't receive a letter, please call our office to schedule the follow-up appointment.    Remote monitoring is used to monitor your Pacemaker or ICD from home. This monitoring reduces the number of office visits required to check your device to one time per year. It allows [name] to keep an eye on the functioning of your device to ensure it is working properly. You are scheduled for a device check from home on 02/01/15. You may send your transmission at any time that day. If you have a wireless device, the transmission will be sent automatically. After your physician reviews your transmission, you will receive a postcard with your next transmission date.

## 2014-11-02 NOTE — Assessment & Plan Note (Signed)
Her Medtronic dual-chamber pacemaker is working normally. We'll plan to recheck in several months.

## 2014-11-02 NOTE — Progress Notes (Signed)
HPI [name] [name] returns today for followup. She is a very pleasant 64 year old woman with multiple medical problems including mantle radiation for Hodgkin's disease, and subsequent coronary artery disease status post bypass grafting. She develop recurrent chest pain and underwent catheterization several months ago demonstrating left main stenosis and underwent stenting of the left main and left anterior descending coronary arteries. 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 She has not had syncope. She denies medication noncompliance. No peripheral edema. No anginal symptoms. In the interim, she has had a single episode of heart failure which she treated at home with additional Lasix and her shortness of breath resolved over 2 days. Allergies  Allergen Reactions  . Cephalexin Swelling and Rash    Extremities swell  Extremities swell   . Clindamycin Hcl Anaphylaxis and Shortness Of Breath  . Clindamycin/Lincomycin Anaphylaxis and Shortness Of Breath  . Erythromycin Nausea And Vomiting  . Humalog  [Insulin Lispro (Human)] Hives and Itching  . Novolog [Insulin Aspart] Itching and Hives    Patient reports she has severe itching with Novolog insulin Patient reports she has severe itching with Novolog insulin  . Penicillins Anaphylaxis, Hives, Rash and Swelling    Childhood allergy  rash Childhood allergy   . Tizanidine Other (See Comments)    Other reaction(s): Hallucination, Other (See Comments) Hallucinate Hallucinate  . Crestor [Rosuvastatin] Other (See Comments), Nausea Only and Nausea And Vomiting    Other reaction(s): Other (See Comments) Muscle and joint pain & Muscle and joint pain &  . Gentamycin [Gentamicin] Hives, Itching and Rash    Reaction noted post loop recorder implant, where gentamycin was used for irrigation. Topical prep  . Limonene Hives, Itching and Rash    Reaction noted post loop recorder implant, where gentamycin was used for  irrigation. Topical prep  . Atorvastatin Other (See Comments)     increased LFT's  . Avandia [Rosiglitazone Maleate]     Congestive heart failure  . Cholestyramine     Other reaction(s): Liver Disorder Elevated LFTs Elevated LFTs  . Codeine Nausea And Vomiting  . Metformin     Congestive heart failure  . Plavix [Clopidogrel Bisulfate]     P2Y12 testing = 271 while on Plavix  . Pravastatin Other (See Comments)    In high doses (20-25m) causes muscle and joint pain   . Simvastatin     Increased LFT's  . Tamiflu [Oseltamivir] Nausea And Vomiting  . Iodinated Diagnostic Agents Other (See Comments)    PAIN DURING LYMPHANGIOGRAM '86, NO ALLERGY TO IV CONTRAST  . Neosporin [Neomycin-Bacitracin Zn-Polymyx] Rash  . Neomycin-Bacitracin-Polymyxin  [Bacitracin-Neomycin-Polymyxin] Rash     Current Outpatient Prescriptions  Medication Sig Dispense Refill  . Alirocumab 75 MG/ML SOPN Inject 75 mg into the skin as directed. Every 4 weeks    . ALPRAZolam (XANAX) 0.5 MG tablet Take 0.25-0.5 mg by mouth 2 (two) times daily as needed for anxiety.     .Marland Kitchenaspirin EC 81 MG tablet Take 81 mg by mouth at bedtime.    .Marland Kitchenazithromycin (ZITHROMAX) 500 MG tablet 1 tablet by mouth one hour prior to dental appointment/procedure 2 tablet 0  . Calcium Carbonate-Vitamin D (RA CALCIUM PLUS VITAMIN D) 600-400 MG-UNIT per tablet Take 1 tablet by mouth 2 (two) times daily.     . Cholecalciferol 2000 UNITS TABS Take 1 tablet by mouth daily.    . clobetasol (TEMOVATE) 0.05 % external solution Apply 1 application topically daily as  needed (dermatosis).    Marland Kitchen desoximetasone (TOPICORT) 0.05 % cream Apply 1 application topically 2 (two) times daily as needed. When skin starts peeling    . diclofenac sodium (VOLTAREN) 1 % GEL Apply topically as needed.     . docusate sodium (COLACE) 100 MG capsule Take 1 capsule (100 mg total) by mouth 2 (two) times daily. 60 capsule 3  . ferrous fumarate (HEMOCYTE - 106 MG FE) 325 (106 FE) MG  TABS tablet Take 1 tablet (106 mg of iron total) by mouth 2 (two) times daily. Or as directed. Take with Vitamin -C 100 each 1  . fluticasone (FLONASE) 50 MCG/ACT nasal spray Place 2 sprays into the nose daily as needed for rhinitis. For nasal congestion    . furosemide (LASIX) 80 MG tablet Take 1 tablet (80 mg total) by mouth daily. 90 tablet 3  . glucagon (GLUCAGON EMERGENCY) 1 MG injection Inject 1 mg into the vein once as needed (for severe reaction).    Marland Kitchen HM OMEPRAZOLE 20 MG TBEC TAKE ONE TABLET TWICE DAILY 84 each 1  . Insulin Human (INSULIN PUMP) 100 unit/ml SOLN Inject into the skin continuous. Apidra Basal Rate midnight-0700 : 0.55 700-830: 2.25 830- 1930: 2.5 1930-midnight: 3.2    . isosorbide mononitrate (IMDUR) 60 MG 24 hr tablet Take 1 tablet (60 mg total) by mouth daily. 90 tablet 3  . Magnesium 250 MG TABS Take 250 mg by mouth daily.     . metoprolol (LOPRESSOR) 50 MG tablet Take 1.5 tablets (75 mg total) by mouth 2 (two) times daily. 270 tablet 0  . Multiple Vitamins-Minerals (MULTIVITAMIN WITH MINERALS) tablet Take 1 tablet by mouth daily.      . nitroGLYCERIN (NITROLINGUAL) 0.4 MG/SPRAY spray Place 1 spray under the tongue every 5 (five) minutes as needed for chest pain. 12 g 1  . ONE TOUCH ULTRA TEST test strip     . polyethylene glycol (MIRALAX / GLYCOLAX) packet Take 8.5 g by mouth daily as needed. For constipation    . potassium chloride SA (K-DUR,KLOR-CON) 20 MEQ tablet Take 40 mEq by mouth every morning.    . pravastatin (PRAVACHOL) 10 MG tablet TAKE ONE (1) TABLET BY MOUTH EVERY DAY 90 tablet 3  . ticagrelor (BRILINTA) 90 MG TABS tablet Take 1 tablet (90 mg total) by mouth 2 (two) times daily. 180 tablet 5  . traMADol (ULTRAM) 50 MG tablet As directed    . vitamin C (ASCORBIC ACID) 500 MG tablet Take 250 mg by mouth daily.     . Wheat Dextrin (BENEFIBER PO) Take 45 mLs by mouth every morning. 3 tablespoons each morning     No current facility-administered medications  for this visit.     Past Medical History  Diagnosis Date  . Aortic stenosis     a. bicuspid aortic valve; mean gradient of 20 mmHg in 2/10; b. s/p bioprosth AVR (19-mm Edwards pericardial valve) with a redo coronary artery bypass graft procedure in 07/2009; postoperative Dressler's syndrome    . Coronary artery disease     a. initial CABG with SVG-RCA in 12/05. b. redo SVG-RCA and bioprosthetic AVR in 1/11. d. 9/14 - PCI with DES to distal LM/proximal LAD was done rather than bypass as she would have been a poor candidate for redo sternotomy. d. S/p DES to prox LM 05/2014.  Marland Kitchen Chronic diastolic CHF (congestive heart failure)     a. 9.2014 EchoP EF 60-65%, no rwma, bioprosth AVR, mean gradient of 78mHg, mild to  mod MR, PASP 61mHg.  .Marland KitchenHyperlipidemia   . Hypertension   . Small bowel obstruction     Recurrent; resolved after resection of a lipoma  . Osteopenia      hip on DEXA in October 2007.  .Marland KitchenAnemia   . Carcinoma, renal cell 05/2007    Laparoscopic right nephrectomy  . NASH (nonalcoholic steatohepatitis) 1999    -biopsy in 1999  . Diabetes mellitus 03/2010     TYPE II: Hemoglobin A1c of 7.4 in  03/2010; 8.4 in 06/2010; treated with insulin pump  . Migraines   . Duodenal ulcer     Remote; H. pylori positive  . Colitis, ischemic 2008  . Gastroesophageal reflux disease     ? of GASTROPARESIS; IRRITABLE BOWEL SYNDROME  . Hodgkin's disease 1991    Mantle radiation therapy  . Statin intolerance   . Hypercalcemia     Ca-11.1 in 07/2011  . Vitamin D deficiency   . Costochondritis   . Insomnia   . Iron deficiency anemia     a. 03/2013 EGD: essentially normal. Possible slow GIB.  .Marland KitchenGastroparesis due to DM 05/01/2012  . PONV (postoperative nausea and vomiting)   . Hyperplastic gastric polyp 06/23/2012  . Arthritis   . Osteoporosis   . Pacemaker 08/21/2012    Medtronic Adapta L dual-chamber pacemaker-Dr. TLovena Le . Anxiety   . Syncope     a. H/o recurrent syncope with pauses on loop  recorder s/p Medtronic pacemaker 07/2012.  . CKD (chronic kidney disease), stage II   . PAF (paroxysmal atrial fibrillation)     a.  h/o brief episodes noted on ppm interrogation. b. given GI bleeding and need to be on both ASA 81 and Brilinta, risks likely outweigh benefits of anticoagulation.     ROS:   All systems reviewed and negative except as noted in the HPI.   Past Surgical History  Procedure Laterality Date  . Total abdominal hysterectomy w/ bilateral salpingoophorectomy  2000  . Breast excisional biopsy      benign, bilateral  . Nephrectomy  2008    Laparoscopic right nephrectomy, renal cell carcinoma  . Coronary artery bypass graft  2005, 2011    CABG-most recent SVG to RCA-12/05; RCA occluded with patent graft in 2006; Redo bypass in 07/2009  . Tumor excision  1981    removal of Hodgkins lymphoma  . Exploratory laparotomy w/ bowel resection      Small bowel resection of lipoma  . Aortic valve replacement  2011    Aortic valve replacement surgery with a 19 mm bioprosthetic device post redo CABG January 2011.  . Colonoscopy      Multiple with adenomatous polyps  . Cervical biopsy    . Liver biopsy    . Bone marrow biopsy    . Esophagogastroduodenoscopy    . Esophagogastroduodenoscopy  06/23/2012    Procedure: ESOPHAGOGASTRODUODENOSCOPY (EGD);  Surgeon: CGatha Mayer MD;  [facility];  Service: Endoscopy;  Laterality: N/A;  . Colonoscopy  06/23/2012    Procedure: COLONOSCOPY;  Surgeon: CGatha Mayer MD;  Location: [facility];  Service: Endoscopy;  Laterality: N/A;  .Venia Minksdilation  06/23/2012    Procedure: MVenia MinksDILATION;  Surgeon: CGatha Mayer MD;  Location: [facility]COPY;  Service: Endoscopy;;  54 fr  . Pacemaker insertion  08/21/2012    Medtronic Adapta L dual-chamber pacemaker  . Esophagogastroduodenoscopy N/A 04/06/2013    Procedure: ESOPHAGOGASTRODUODENOSCOPY (EGD);  Surgeon: JIrene Shipper MD;  [facility];  Service: Endoscopy;   Laterality: N/A;  . Givens capsule study N/A 04/30/2013    Procedure: GIVENS CAPSULE STUDY;  Surgeon: Gatha Mayer, MD;  Location: [place];  Service: Endoscopy;  Laterality: N/A;  . Loop recorder implant N/A 12/10/2011    Procedure: LOOP RECORDER IMPLANT;  Surgeon: Evans Lance, MD;  Location: [facility] C[facility] LAB;  Service: Cardiovascular;  Laterality: N/A;  . Permanent pacemaker insertion N/A 08/21/2012    Procedure: PERMANENT PACEMAKER INSERTION;  Surgeon: Evans Lance, MD;  Location: [facility] CATH LAB;  Service: Cardiovascular;  Laterality: N/A;  . Left and right heart catheterization with coronary angiogram N/A 04/07/2013    Procedure: LEFT AND RIGHT HEART CATHETERIZATION WITH CORONARY ANGIOGRAM;  Surgeon: Larey Dresser, MD;  Location[facility] C[facility] LAB;  Service: Cardiovascular;  Laterality: N/A;  . Percutaneous coronary stent intervention (pci-s) N/A 04/09/2013    Procedure: PERCUTANEOUS CORONARY STENT INTERVENTION (PCI-S);  Surgeon: Burnell Blanks, MD;  Location: [facility] C[facility] LAB;  Service: Cardiovascular;  Laterality: N/A;  . Left heart catheterization with coronary/graft angiogram N/A 06/07/2014    Procedure: LEFT HEART CATHETERIZATION WITH Beatrix Fetters;  Surgeon: Burnell Blanks, MD;  Location: [facility] C[facility] LAB;  Service: Cardiovascular;  Laterality: N/A;     Family History  Problem Relation Age of Onset  . Heart attack Mother   . Heart disease Mother   . Heart attack Father   . Diabetes Father   . Heart disease Father   . Hypertension Brother   . Glaucoma Brother   . Lung cancer Sister     meta  . Breast cancer Sister     half sister (Paternal)  . Ovarian cancer Sister   . Cervical cancer Sister     half sister (maternal)  . Breast cancer Sister   . Colon cancer Neg Hx   . Stomach cancer Neg Hx   . Lung cancer Sister     half sister (maternal)     History   Social History  . Marital Status: Married    Spouse Name: N/A  . Number of Children: 2  . Years of  Education: N/A   Occupational History  . full time     elementary principal   Social History Main Topics  . Smoking status: Never Smoker   . Smokeless tobacco: Never Used  . Alcohol Use: No  . Drug Use: No  . Sexual Activity: Yes   Other Topics Concern  . 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     BP 132/64 mmHg  Pulse 41  Ht 5' 3"  (1.6 m)  Wt 128 lb 6.4 oz (58.242 kg)  BMI 22.75 kg/m2  Physical Exam:  Well appearing 64 year old woman, NAD HEENT: Unremarkable Neck:  No JVD, no thyromegally Back:  No CVA tenderness Lungs:  Clear with no wheezes, rales, or rhonchi. HEART:  Regular rate rhythm, no murmurs, no rubs, no clicks Abd:  soft, positive bowel sounds, no organomegally, no rebound, no guarding Ext:  2 plus pulses, no edema, no cyanosis, no clubbing Skin:  No rashes no nodules Neuro:  CN II through XII intact, motor grossly intact  DEVICE  Normal device function.  See PaceArt for details.   Assess/Plan:

## 2014-11-02 NOTE — Assessment & Plan Note (Signed)
She has had one episode of shortness of breath. She is encouraged to maintain a low-sodium diet. She will continue her diuretic therapy.

## 2014-11-02 NOTE — Assessment & Plan Note (Signed)
The patient is having rare and brief episodes of paroxysmal atrial fibrillation. She has an indication for systemic anticoagulation. Unfortunately, she is also on Brilinta. My concern is that the combination of either warfarin and this medication or a NOAC will lead to an increased risk of bleeding. I reviewed the patient's most recent operative note. It is unclear to me whether she still has a left atrial appendage or not. Exclusion device would be a consideration if she still has her left atrial appendage.

## 2014-11-02 NOTE — Assessment & Plan Note (Signed)
Her blood pressure is well controlled. She will continue her current medications.

## 2014-11-09 ENCOUNTER — Telehealth: Payer: Self-pay | Admitting: Cardiology

## 2014-11-09 NOTE — Telephone Encounter (Signed)
New message       Need ICD code for praluent.  Please use reference # [id number]

## 2014-11-10 ENCOUNTER — Telehealth: Payer: Self-pay | Admitting: Internal Medicine

## 2014-11-10 NOTE — Telephone Encounter (Signed)
Spoke with pharmacy.  ICD-10 code of E78.5 given.

## 2014-11-10 NOTE — Telephone Encounter (Signed)
Follow Up       Pt calling back stating that she no longer needs a phone call from our office because she figured it out.

## 2014-11-10 NOTE — Telephone Encounter (Signed)
New Message        Pt calling stating that she needs help with her transmitter. She needs someone to walk her through using it. Please call back and advise.

## 2014-11-29 ENCOUNTER — Telehealth: Payer: Self-pay | Admitting: Cardiology

## 2014-11-29 DIAGNOSIS — D509 Iron deficiency anemia, unspecified: Secondary | ICD-10-CM

## 2014-11-29 DIAGNOSIS — E785 Hyperlipidemia, unspecified: Secondary | ICD-10-CM

## 2014-11-29 NOTE — Telephone Encounter (Signed)
New message         Pt has been having diarrhea with bright red blood   Should she continue all of her current medication?   pt was in afib Friday night, Sunday night, and this morning for 2 minutes   please give pt a call

## 2014-11-29 NOTE — Telephone Encounter (Signed)
C/o diarrhea x 3 days, went out to eat Friday evening:baked fish, potato and broccoli. Diarrhea started 4hrs later and has continued. Blood noted yesterday afternoon on toilet paper/ bright red with each mucus stool. She states it is only on the toilet paper/ none in the toilet. Pt was advised to call PCP for an app, continue with Brillinta at this time and call with further questions. Pt verbalized understanding and was excepting of plan.

## 2014-12-10 NOTE — Telephone Encounter (Signed)
New Message  Pt called unable to have her labs completed at PCP. They rescheduled her appt. Recently placed on prevalent. Labs were needed this month to monitor the medication. Made lab appt for 12/14/2014. Please put in orders.

## 2014-12-10 NOTE — Telephone Encounter (Signed)
Calling stating she was to have lab done during her physical today with her PCP but they cancelled the appointment. She states she is on Praluent inj every 2 weeks and they are monitoring her lipids closely. She is scheduled to come in Tues for labs.  Will place order for LP and CBC w/diff since they are monitoring that closely also. She would like Elberta Leatherwood also to notified of results.

## 2014-12-14 ENCOUNTER — Other Ambulatory Visit (INDEPENDENT_AMBULATORY_CARE_PROVIDER_SITE_OTHER): Payer: Medicare Other | Admitting: *Deleted

## 2014-12-14 ENCOUNTER — Encounter: Payer: Self-pay | Admitting: Oncology

## 2014-12-14 DIAGNOSIS — D509 Iron deficiency anemia, unspecified: Secondary | ICD-10-CM

## 2014-12-14 DIAGNOSIS — R7989 Other specified abnormal findings of blood chemistry: Secondary | ICD-10-CM | POA: Diagnosis not present

## 2014-12-14 DIAGNOSIS — E785 Hyperlipidemia, unspecified: Secondary | ICD-10-CM

## 2014-12-14 LAB — LDL CHOLESTEROL, DIRECT: Direct LDL: 43 mg/dL

## 2014-12-14 LAB — CBC WITH DIFFERENTIAL/PLATELET
Basophils Absolute: 0 10*3/uL (ref 0.0–0.1)
Basophils Relative: 0.4 % (ref 0.0–3.0)
EOS ABS: 0.2 10*3/uL (ref 0.0–0.7)
Eosinophils Relative: 1.7 % (ref 0.0–5.0)
HCT: 38.2 % (ref 36.0–46.0)
Hemoglobin: 12.9 g/dL (ref 12.0–15.0)
LYMPHS ABS: 1.4 10*3/uL (ref 0.7–4.0)
Lymphocytes Relative: 13 % (ref 12.0–46.0)
MCHC: 33.8 g/dL (ref 30.0–36.0)
MCV: 87.7 fl (ref 78.0–100.0)
MONOS PCT: 6.4 % (ref 3.0–12.0)
Monocytes Absolute: 0.7 10*3/uL (ref 0.1–1.0)
Neutro Abs: 8.3 10*3/uL — ABNORMAL HIGH (ref 1.4–7.7)
Neutrophils Relative %: 78.5 % — ABNORMAL HIGH (ref 43.0–77.0)
PLATELETS: 118 10*3/uL — AB (ref 150.0–400.0)
RBC: 4.35 Mil/uL (ref 3.87–5.11)
RDW: 14.8 % (ref 11.5–15.5)
WBC: 10.6 10*3/uL — ABNORMAL HIGH (ref 4.0–10.5)

## 2014-12-14 LAB — LIPID PANEL
CHOL/HDL RATIO: 3
Cholesterol: 113 mg/dL (ref 0–200)
HDL: 43.6 mg/dL (ref >39.00)
NONHDL: 69.4
TRIGLYCERIDES: 230 mg/dL — AB (ref 0.0–149.0)
VLDL: 46 mg/dL — AB (ref 0.0–40.0)

## 2014-12-21 ENCOUNTER — Telehealth: Payer: Self-pay

## 2014-12-21 ENCOUNTER — Other Ambulatory Visit: Payer: Self-pay | Admitting: Oncology

## 2014-12-21 ENCOUNTER — Telehealth: Payer: Self-pay | Admitting: Cardiology

## 2014-12-21 DIAGNOSIS — D696 Thrombocytopenia, unspecified: Secondary | ICD-10-CM

## 2014-12-21 NOTE — Telephone Encounter (Signed)
Spoke with [name] [name] today and she said that [name] [name] at [facility] sent her a [facility] message stating that she should see her hematologist ASAP to follow up lower plt count of 118K on 12-14-14 with [name] [name] and it was stable at [facility] on 12-15-14 at 120K to R/O ITP.  She has been experiencing some blood blisters in her mouth the last couple of months.  The blisters last for 24-36 hours. She has not seen any frank blood in stool since 12-05-14. Her stools are black from iron.  The stool has been partially formed  with some fragments and more loose the last ~2 weeks.  She has been experiencing some abdominal crams the last couple of days. Her AP was 164 on 09-27-14 with [name] [name].  On 12-15-14 at [facility] the AP was 174. [name] [name] suggests stopping the Alircoumab 75 mg every 2 weeks and seeing hematologist for possible ITP as noted above. [name] [name] also has her on ASA 81 mg and Birlinta. [name] [name] faxed CBC/ diff from [facility] done on 12-15-14 for [name] [name] to review.

## 2014-12-21 NOTE — Telephone Encounter (Signed)
Pt states she saw her GI doctor at [facility] were done 12/15/14. Pt states GI doctor told her the alkaline phosphatase was up to 175, her platelet count was down to 120,000 (118,000 here 12/14/14). Pt states her GI doctor recommended that she call [name] to ask if she should continue Praulent. Pt states she has an appt with Dr Marko Plume this Thursday to have platelet count repeated, her doctors are concerned about ITP.   Pt advised I will forward to [name] for review.

## 2014-12-21 NOTE — Telephone Encounter (Signed)
New Message  Pt c/o medication issue:  1. Name of Medication: Praluent 2. How are you currently taking this medication (dosage and times per day)? Injectable once Q 2weeks 70m  3. Are you having a reaction (difficulty breathing--STAT)? extemely elevated liver enzymes and it continues to rise  And platelet count is decreasing   4. What is your medication issue? Does patient need to stop taking it.

## 2014-12-21 NOTE — Telephone Encounter (Signed)
Patient Demographics     Patient Name Sex DOB SSN Address Phone    [name] [name], [name] [name] Female 1950-08-18 [id number] [place] [address] [phone] (Home) *Preferred* [phone] (Mobile)      Message  Received: Today    [name], MD  Baruch Merl, RN           Please let her know that I have looked at the information re recent platelet counts.  OK to get CBC here this week and I will see her next available, which is June 20, but I will look at labs this week.   (the lab work she sent does not have the CBC with it, only the diff. It may be in [place], otherwise we can request from [facility])   thanks

## 2014-12-21 NOTE — Telephone Encounter (Signed)
Patient Demographics     Patient Name Sex DOB SSN Address Phone    [name] [name], [name] [name] Female 07-Oct-1950 [id number] [place] [address] [phone] (Home) *Preferred* [phone] (Mobile)      Message  Received: Today    [name], MD  Baruch Merl, RN           Please let her know that I have looked at the information re recent platelet counts.  OK to get CBC here this week and I will see her next available, which is June 20, but I will look at labs this week.   (the lab work she sent does not have the CBC with it, only the diff. It may be in [place], otherwise we can request from [facility])   thanks

## 2014-12-21 NOTE — Telephone Encounter (Signed)
Set an appointment for lab on 12-23-14 at 0945. [name] aware.  Told her that she did not need to wait for results. Told her that the schedulers will  call her with a follow up appointment as noted below by Dr. Marko Plume. Received CBC/diff and chemistries from 12-15-14 appointment with Dr. Pleas [facility] and given to Dr. Marko Plume to review.

## 2014-12-21 NOTE — Telephone Encounter (Signed)
Thrombocytopenia is not a typical side effect of Praluent.  Would have her continue it for now but see her hematologist to discuss further.

## 2014-12-21 NOTE — Telephone Encounter (Signed)
I will forward to Elberta Leatherwood, RPhar, lipid clinic.

## 2014-12-22 ENCOUNTER — Encounter: Payer: Self-pay | Admitting: Oncology

## 2014-12-22 ENCOUNTER — Telehealth: Payer: Self-pay | Admitting: Oncology

## 2014-12-22 NOTE — Telephone Encounter (Signed)
Pt given Dr Claris Gladden recommendation to continue Praulent.

## 2014-12-22 NOTE — Telephone Encounter (Signed)
Dr Ezekiel Slocumb you recommend that she continue Praulent?

## 2014-12-22 NOTE — Telephone Encounter (Signed)
Pt also asking if she should continue Praulent since her alkaline phosphatase was 175.  I will forward to [name].

## 2014-12-22 NOTE — Telephone Encounter (Signed)
Called and left as message with 6/2 and 6/20 appointment

## 2014-12-22 NOTE — Telephone Encounter (Signed)
yes

## 2014-12-22 NOTE — Telephone Encounter (Signed)
Cannot totally rule out Praluent but suspect it was unlikely that this caused alk phos increase.

## 2014-12-23 ENCOUNTER — Telehealth: Payer: Self-pay | Admitting: *Deleted

## 2014-12-23 ENCOUNTER — Other Ambulatory Visit (HOSPITAL_BASED_OUTPATIENT_CLINIC_OR_DEPARTMENT_OTHER): Payer: Medicare Other

## 2014-12-23 DIAGNOSIS — D696 Thrombocytopenia, unspecified: Secondary | ICD-10-CM

## 2014-12-23 DIAGNOSIS — Z8571 Personal history of Hodgkin lymphoma: Secondary | ICD-10-CM

## 2014-12-23 LAB — CHCC SMEAR

## 2014-12-23 LAB — CBC WITH DIFFERENTIAL/PLATELET
BASO%: 0.5 % (ref 0.0–2.0)
Basophils Absolute: 0 10*3/uL (ref 0.0–0.1)
EOS%: 2.1 % (ref 0.0–7.0)
Eosinophils Absolute: 0.2 10*3/uL (ref 0.0–0.5)
HCT: 41.8 % (ref 34.8–46.6)
HGB: 13.4 g/dL (ref 11.6–15.9)
LYMPH%: 16.8 % (ref 14.0–49.7)
MCH: 29.5 pg (ref 25.1–34.0)
MCHC: 32.1 g/dL (ref 31.5–36.0)
MCV: 92.1 fL (ref 79.5–101.0)
MONO#: 0.4 10*3/uL (ref 0.1–0.9)
MONO%: 5.4 % (ref 0.0–14.0)
NEUT%: 75.2 % (ref 38.4–76.8)
NEUTROS ABS: 5.9 10*3/uL (ref 1.5–6.5)
PLATELETS: 137 10*3/uL — AB (ref 145–400)
RBC: 4.54 10*6/uL (ref 3.70–5.45)
RDW: 14.6 % — ABNORMAL HIGH (ref 11.2–14.5)
WBC: 7.9 10*3/uL (ref 3.9–10.3)
lymph#: 1.3 10*3/uL (ref 0.9–3.3)

## 2014-12-23 LAB — MORPHOLOGY: PLT EST: DECREASED

## 2014-12-23 NOTE — Telephone Encounter (Signed)
Pt notified of results below. Verbalized understanding.

## 2014-12-23 NOTE — Telephone Encounter (Signed)
-----   Message from [name], MD sent at 12/23/2014 10:51 AM EDT ----- Labs seen and need follow up: please let her know that platelets are 137 today, with hemoglobin great at 13.4 and WBC also in good normal range. This platelet count is in the range that she has been for over a year, and is a good number. I do not see low platelets as documented side effect of Alirocumab; her cardiologist and GI physician are the best MDs to give direction on that medication (she is on lowest dose). Keep apt as scheduled with me on 6-20 and we will repeat CBC also then. Call prior if significant bleeding. Fine to continue ASA and Brilinta now

## 2014-12-24 ENCOUNTER — Telehealth: Payer: Self-pay

## 2014-12-24 NOTE — Telephone Encounter (Deleted)
-----   Message from [name], MD sent at 12/23/2014 10:51 AM EDT ----- Labs seen and need follow up: please let her know that platelets are 137 today, with hemoglobin great at 13.4 and WBC also in good normal range. This platelet count is in the range that she has been for over a year, and is a good number. I do not see low platelets as documented side effect of Alirocumab; her cardiologist and GI physician are the best MDs to give direction on that medication (she is on lowest dose). Keep apt as scheduled with me on 6-20 and we will repeat CBC also then. Call prior if significant bleeding. Fine to continue ASA and Brilinta now

## 2014-12-24 NOTE — Telephone Encounter (Signed)
ENCOUNTER OPENED IN ERROR

## 2014-12-27 ENCOUNTER — Ambulatory Visit (HOSPITAL_COMMUNITY)
Admission: RE | Admit: 2014-12-27 | Discharge: 2014-12-27 | Disposition: A | Discharge status: Home / Self Care | Payer: Medicare Other | Source: Ambulatory Visit | Attending: Family Medicine | Admitting: Family Medicine

## 2014-12-27 ENCOUNTER — Other Ambulatory Visit (HOSPITAL_COMMUNITY): Payer: Self-pay | Admitting: Family Medicine

## 2014-12-27 DIAGNOSIS — W19XXXA Unspecified fall, initial encounter: Secondary | ICD-10-CM | POA: Insufficient documentation

## 2014-12-27 DIAGNOSIS — M25572 Pain in left ankle and joints of left foot: Secondary | ICD-10-CM | POA: Insufficient documentation

## 2014-12-27 DIAGNOSIS — M25562 Pain in left knee: Secondary | ICD-10-CM | POA: Diagnosis present

## 2014-12-27 DIAGNOSIS — R2242 Localized swelling, mass and lump, left lower limb: Secondary | ICD-10-CM | POA: Diagnosis not present

## 2014-12-30 ENCOUNTER — Other Ambulatory Visit: Payer: Self-pay

## 2014-12-30 MED ORDER — METOPROLOL TARTRATE 50 MG PO TABS: 75.0000 mg | ORAL_TABLET | Freq: Two times a day (BID) | ORAL | Status: DC

## 2014-12-30 NOTE — Telephone Encounter (Signed)
Per note 4.12.16

## 2015-01-09 ENCOUNTER — Other Ambulatory Visit: Payer: Self-pay | Admitting: Oncology

## 2015-01-10 ENCOUNTER — Encounter: Payer: Self-pay | Admitting: Oncology

## 2015-01-10 ENCOUNTER — Other Ambulatory Visit (HOSPITAL_BASED_OUTPATIENT_CLINIC_OR_DEPARTMENT_OTHER): Payer: Medicare Other

## 2015-01-10 ENCOUNTER — Telehealth: Payer: Self-pay | Admitting: Oncology

## 2015-01-10 ENCOUNTER — Ambulatory Visit (HOSPITAL_BASED_OUTPATIENT_CLINIC_OR_DEPARTMENT_OTHER): Payer: Medicare Other | Admitting: Oncology

## 2015-01-10 VITALS — BP 143/54 | HR 72 | Temp 98.0°F | Resp 18 | Ht 63.0 in | Wt 125.4 lb

## 2015-01-10 DIAGNOSIS — E119 Type 2 diabetes mellitus without complications: Secondary | ICD-10-CM

## 2015-01-10 DIAGNOSIS — E039 Hypothyroidism, unspecified: Secondary | ICD-10-CM

## 2015-01-10 DIAGNOSIS — Z923 Personal history of irradiation: Secondary | ICD-10-CM

## 2015-01-10 DIAGNOSIS — D500 Iron deficiency anemia secondary to blood loss (chronic): Secondary | ICD-10-CM

## 2015-01-10 DIAGNOSIS — D696 Thrombocytopenia, unspecified: Secondary | ICD-10-CM

## 2015-01-10 DIAGNOSIS — Z1231 Encounter for screening mammogram for malignant neoplasm of breast: Secondary | ICD-10-CM

## 2015-01-10 LAB — CBC WITH DIFFERENTIAL/PLATELET
BASO%: 1.1 % (ref 0.0–2.0)
BASOS ABS: 0.1 10*3/uL (ref 0.0–0.1)
EOS ABS: 0.2 10*3/uL (ref 0.0–0.5)
EOS%: 2 % (ref 0.0–7.0)
HCT: 39.6 % (ref 34.8–46.6)
HGB: 13.1 g/dL (ref 11.6–15.9)
LYMPH#: 1.5 10*3/uL (ref 0.9–3.3)
LYMPH%: 14.9 % (ref 14.0–49.7)
MCH: 29.4 pg (ref 25.1–34.0)
MCHC: 33.2 g/dL (ref 31.5–36.0)
MCV: 88.4 fL (ref 79.5–101.0)
MONO#: 0.6 10*3/uL (ref 0.1–0.9)
MONO%: 5.9 % (ref 0.0–14.0)
NEUT#: 7.8 10*3/uL — ABNORMAL HIGH (ref 1.5–6.5)
NEUT%: 76.1 % (ref 38.4–76.8)
Platelets: 146 10*3/uL (ref 145–400)
RBC: 4.47 10*6/uL (ref 3.70–5.45)
RDW: 14.6 % — AB (ref 11.2–14.5)
WBC: 10.2 10*3/uL (ref 3.9–10.3)

## 2015-01-10 NOTE — Progress Notes (Signed)
OFFICE PROGRESS NOTE   January 10, 2015   Physicians: W.McGough, [name] (endocrine [facility]), [name], D.McLean, G.Lovena Le, B.Bartle, A.Gerald Dexter (GI [facility]), Sanjuana Kava (ortho, [place]), [name]  INTERVAL HISTORY Patient is seen, together with husband, earlier than originally planned due to question of thrombocytopenia.  She is regularly followed for anemia related to previous GI bleeding on Brilinta and ASA for cardiac indications. She has remote history of Hodgkins treated with mantle radiation in 1980s, thought related to the cardiac disease, and right nephrectomy for renal cell ca in 2008. 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  CBC by PCP on 12-15-14 had WBC 8.3, Hgb 13.2, plt 120k, with morphology calling giant platelets.  CBC repeated at this office on 12-23-14 had WBC 7.9, Hgb 13.4 and platelets 137k, with platelets felt WNL by morphology that day.  We let patient know then that the platelet count was in range where she has been for past year.  Patient also requested that I discuss drug Praluent (Alirocumab), which she has started recently by [name], dosed at 75 mg every 2 weeks, which is lower of the 2 approved doses. Hepatologist was concerned that she might have worsening of liver function related to this medication; certainly she is at extremely high risk of further coronary artery blockages, with limited other options in that regard. I have reviewed with her LFTs from [facility] system 09-27-14 with AP 164, AST 39, ALT 52 , compared with LFTs from [facility] 12-15-14 with AP slightly higher at 175 but AST lower at 36 and ALT lower at 48.  I do not find any information about platelet effects from this relatively new drug. Patient understands that her cardiac disease is probably the most critical of her multiple medical  problems.  Patient fell on metal steps onto asphalt ~ 2 weeks ago, sustaining hairline fracture of left tibia and abrasions of knees. She is in boot by Dr Sanjuana Kava, improving.  She has had no bleeding other than with the fall, no unusual bruising.  She has had no symptoms of CHF in a number of weeks.  Overall she is actually feeling quite well at present.  She is to see [name] next in Sept, Dr Frederik Pear next week and every 6 months, Dr Lorrene Reid in August.  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 She has radiation induced hypothyroidism and the treatment caused or worsened coronary artery disease. She has had no further active Hodgkins.  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 She is no longer followed regularly by urology.   Review of systems as above, also: No recent infectious illness including viral illness.  No SOB. Pacer, frequently not aware of rhythm abnormalities picked up on monitoring Remainder of 10 point Review of Systems negative.  Objective:  Vital signs in last 24 hours:  BP 143/54 mmHg  Pulse 72  Temp(Src) 98 F (36.7 C) (Oral)  Resp 18  Ht 5' 3"  (1.6 m)  Wt 125 lb 6.4 oz (  56.881 kg)  BMI 22.22 kg/m2  SpO2 99% Weight stable Alert, oriented and appropriate. Ambulatory with orthopedic boot    HEENT:PERRL, sclerae not icteric. Oral mucosa moist without lesions, posterior pharynx clear.  Neck supple. No JVD.  Lymphatics:no cervical,supraclavicular adenopathy Resp: clear to auscultation bilaterally  Cardio: regular rate and rhythm. No gallop. GI: soft,  nontender, not distended, no mass or organomegaly. Some bowel sounds.  Musculoskeletal/ Extremities: without pitting edema, cords, tenderness Skin without rash, ecchymosis, petechiae. Abrasions on knees with thick dark eschar but no surrounding erythema Breast exam done in 10-2014 at my visit then  Lab Results:  Results for orders placed or performed in visit on 01/10/15  CBC with Differential/Platelet  Result Value Ref Range   WBC 10.2 3.9 - 10.3 10e3/uL   NEUT# 7.8 (H) 1.5 - 6.5 10e3/uL   HGB 13.1 11.6 - 15.9 g/dL   HCT 39.6 34.8 - 46.6 %   Platelets 146 145 - 400 10e3/uL   MCV 88.4 79.5 - 101.0 fL   MCH 29.4 25.1 - 34.0 pg   MCHC 33.2 31.5 - 36.0 g/dL   RBC 4.47 3.70 - 5.45 10e6/uL   RDW 14.6 (H) 11.2 - 14.5 %   lymph# 1.5 0.9 - 3.3 10e3/uL   MONO# 0.6 0.1 - 0.9 10e3/uL   Eosinophils Absolute 0.2 0.0 - 0.5 10e3/uL   Basophils Absolute 0.1 0.0 - 0.1 10e3/uL   NEUT% 76.1 38.4 - 76.8 %   LYMPH% 14.9 14.0 - 49.7 %   MONO% 5.9 0.0 - 14.0 %   EOS% 2.0 0.0 - 7.0 %   BASO% 1.1 0.0 - 2.0 %   *Note: Due to a large number of results and/or encounters for the requested time period, some results have not been displayed. A complete set of results can be found in Results Review.     Studies/Results:  No results found.  Medications: I have reviewed the patient's current medications. Praluent as above.   DISCUSSION: information as above. Differences in platelets are not uncommon, with count very adequate from 12-23-14 and again today.  We have discussed multiple specialists each watching out for their own areas; patient appreciates everyone's efforts with her multiple comorbidities.  Assessment/Plan: 1.Some variability in platelet counts, but overall appears stable and in adequate range; will follow. Previous anemia related to intermittent GI bleeding in patient on necessary long term brilinta and ASA for CAD. Hemoglobin good today. Follow up as above. Note needs lasix if PRBCs. 2.IIA  nodular sclerosing Hodgkins treated with mantle radiation in 1980s.  3.post right nephrectomy for renal cell carcinoma 2008, not known recurrent. Last UA without blood.  4.Cardiac disease: CAD post RCA vein graft and redo CABG 2011, aortic stenosis post aortic valve 2011, sinus node dysfunction with brief episodes PAF, dual chamber pacer Jan 2014. Post stent to LM into proximal LAD for progressive involvement 04-2013 and PTCA/ DES LM 05-2014, Not candidate for another surgery per CVTS. Alirocumab recommended by cardiologist due to this significant cardiac disease.  5. Insulin dependent diabetes: followed by Dr [name] at [facility], on insulin pump.  6.hypothyroidism post RT, on replacement, Dr Frederik Pear also following thyroid US  7.rectal bleeding ~ 12-2012 thought from ischemic colitis. Post colonoscopy 06-2012 with no polyps.  8.renal insufficiency: single kidney 9.gastroparesis related to DM: improved with interventions including dietary changes thru [facility]. 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  10. Amicar mouthwash very helpful for gum bleeding after dental procedure; she could try this  topically if skin scratches bleed excessively. 11.RLE pain related to cath, resolved without vascular intervention, and related to muscle spasm being addressed with outpatient PT 12.increased risk for breast cancer due to previous mantle radiation. Mammograms [facility] 02-22-14 as above, ordered again for 02-2015. 13.recent fall with left tibial hairline fracture: improving   I will see her with blood counts in 6 months or sooner if needed. I am glad to receive labs from other MDs in interim. All questions addressed and patient and husband are comfortable with discussion, recommendations and plans. Time spent 20 min including >50% counseling and coordination of care.   LIVESAY,LENNIS P, MD   01/10/2015, 7:46 PM

## 2015-01-10 NOTE — Telephone Encounter (Signed)
Appointments made and avs printed for patient,patient already had her mammo scheduled for 8/22 at 10

## 2015-01-11 ENCOUNTER — Encounter: Payer: Self-pay | Admitting: Cardiology

## 2015-01-11 NOTE — Telephone Encounter (Signed)
LMTCB

## 2015-01-12 ENCOUNTER — Telehealth: Payer: Self-pay | Admitting: Cardiology

## 2015-01-12 NOTE — Telephone Encounter (Signed)
New message     Returning Anne's call, pt says okay to wait for Webb Silversmith to call

## 2015-01-13 NOTE — Telephone Encounter (Signed)
Pt states her PCP, Dr Orson Ape, has retired.   Pt lives in [place], she is asking for a recommendation from [name] for a PCP, she is willing to drive to [place] if necessary.   Pt advised I will forward to [name] for review

## 2015-01-13 NOTE — Telephone Encounter (Signed)
Dr Buelah Manis in [place] is good.  [place]: [place] recommendations would be Dr Yong Channel, Dr Alain Marion, Dr Ronnald Ramp, Dr Charlett Blake, Dr Etter Sjogren Charlett Blake and Etter Sjogren are women).

## 2015-01-13 NOTE — Telephone Encounter (Signed)
Sent pt MyChart message with these recommendations from [name] at pt's request.

## 2015-01-18 ENCOUNTER — Encounter: Payer: Self-pay | Admitting: Pharmacist

## 2015-01-18 DIAGNOSIS — E785 Hyperlipidemia, unspecified: Secondary | ICD-10-CM

## 2015-01-21 ENCOUNTER — Encounter: Payer: Self-pay | Admitting: Cardiology

## 2015-01-27 ENCOUNTER — Other Ambulatory Visit (INDEPENDENT_AMBULATORY_CARE_PROVIDER_SITE_OTHER): Payer: Medicare Other | Admitting: *Deleted

## 2015-01-27 DIAGNOSIS — E785 Hyperlipidemia, unspecified: Secondary | ICD-10-CM | POA: Diagnosis not present

## 2015-01-27 LAB — LIPID PANEL
Cholesterol: 137 mg/dL (ref 0–200)
HDL: 40.5 mg/dL (ref >39.00)
LDL CALC: 59 mg/dL (ref 0–99)
NonHDL: 96.5
Total CHOL/HDL Ratio: 3
Triglycerides: 186 mg/dL — ABNORMAL HIGH (ref 0.0–149.0)
VLDL: 37.2 mg/dL (ref 0.0–40.0)

## 2015-01-27 LAB — HEPATIC FUNCTION PANEL
ALT: 49 U/L — ABNORMAL HIGH (ref 0–35)
AST: 34 U/L (ref 0–37)
Albumin: 4.3 g/dL (ref 3.5–5.2)
Alkaline Phosphatase: 125 U/L — ABNORMAL HIGH (ref 39–117)
BILIRUBIN DIRECT: 0.2 mg/dL (ref 0.0–0.3)
TOTAL PROTEIN: 7.8 g/dL (ref 6.0–8.3)
Total Bilirubin: 0.8 mg/dL (ref 0.2–1.2)

## 2015-01-27 NOTE — Addendum Note (Signed)
Addended by: Eulis Foster on: 01/27/2015 08:55 AM   Modules accepted: Orders

## 2015-02-01 ENCOUNTER — Ambulatory Visit (INDEPENDENT_AMBULATORY_CARE_PROVIDER_SITE_OTHER): Payer: Medicare Other | Admitting: *Deleted

## 2015-02-01 ENCOUNTER — Encounter: Payer: Self-pay | Admitting: Internal Medicine

## 2015-02-01 DIAGNOSIS — I495 Sick sinus syndrome: Secondary | ICD-10-CM

## 2015-02-01 NOTE — Progress Notes (Signed)
Remote pacemaker transmission.   

## 2015-02-03 LAB — CUP PACEART REMOTE DEVICE CHECK
Battery Voltage: 2.8 V
Brady Statistic AP VP Percent: 0 %
Brady Statistic AS VP Percent: 0 %
Date Time Interrogation Session: 20160712142021
Lead Channel Pacing Threshold Amplitude: 0.875 V
Lead Channel Sensing Intrinsic Amplitude: 22.4 mV
Lead Channel Setting Pacing Amplitude: 1.75 V
Lead Channel Setting Pacing Amplitude: 2.5 V
Lead Channel Setting Sensing Sensitivity: 5.6 mV
MDC IDC MSMT BATTERY IMPEDANCE: 137 Ohm
MDC IDC MSMT BATTERY REMAINING LONGEVITY: 163 mo
MDC IDC MSMT LEADCHNL RA IMPEDANCE VALUE: 487 Ohm
MDC IDC MSMT LEADCHNL RA PACING THRESHOLD PULSEWIDTH: 0.4 ms
MDC IDC MSMT LEADCHNL RA SENSING INTR AMPL: 2.8 mV
MDC IDC MSMT LEADCHNL RV IMPEDANCE VALUE: 659 Ohm
MDC IDC MSMT LEADCHNL RV PACING THRESHOLD AMPLITUDE: 0.625 V
MDC IDC MSMT LEADCHNL RV PACING THRESHOLD PULSEWIDTH: 0.4 ms
MDC IDC SET LEADCHNL RV PACING PULSEWIDTH: 0.4 ms
MDC IDC STAT BRADY AP VS PERCENT: 0 %
MDC IDC STAT BRADY AS VS PERCENT: 100 %

## 2015-02-09 ENCOUNTER — Other Ambulatory Visit: Payer: Self-pay | Admitting: Oncology

## 2015-02-09 DIAGNOSIS — D500 Iron deficiency anemia secondary to blood loss (chronic): Secondary | ICD-10-CM

## 2015-02-09 DIAGNOSIS — D649 Anemia, unspecified: Secondary | ICD-10-CM

## 2015-02-09 MED ORDER — FERROUS FUMARATE 325 (106 FE) MG PO TABS: 1.0000 | ORAL_TABLET | Freq: Two times a day (BID) | ORAL | Status: DC

## 2015-02-16 ENCOUNTER — Encounter: Payer: Self-pay | Admitting: *Deleted

## 2015-03-02 ENCOUNTER — Telehealth: Payer: Self-pay | Admitting: Internal Medicine

## 2015-03-02 NOTE — Telephone Encounter (Signed)
New message    Pt states heart rate was 85 then went down to 71 then back up 82 Pt states beating irregular Pt has Afib Pt has a flutter feeling in throat that was been going on for about 10-15 minutes Pt also wanting to know if she should in device transmission to see if it has picked up anything  Please call to discuss

## 2015-03-02 NOTE — Telephone Encounter (Signed)
Spoke with patient and had her send a transmission from her PPM.  She was correct and had afib for about 13 min this morning.  Per Dr Tanna Furry last office note he is aware she is having mode switches that are short in duration.  I will show him once he returns on 8/23.  Patient aware and will call me back if needed

## 2015-03-07 ENCOUNTER — Telehealth: Payer: Self-pay | Admitting: Internal Medicine

## 2015-03-07 NOTE — Telephone Encounter (Signed)
LMOM about episodes recorded by pacemaker. No need to send transmission every time. Kelly awaiting Dr. Tanna Furry return in order to further advise.

## 2015-03-07 NOTE — Telephone Encounter (Signed)
New message     Pt had "episode" last night between 10:30-11:30 that lasted more than 10 minutes Pt has had unusual fatigue and not feeling well  Pt has AFIB and wanting to know if device picked up anything last night  Pt had flutter in throat last night and Friday Pt states between episodes she feels fine Pt wants to know when she should and shouldn't send transmission when she has these episodes Please call to discuss

## 2015-03-10 ENCOUNTER — Other Ambulatory Visit: Payer: Self-pay

## 2015-03-10 MED ORDER — NITROGLYCERIN 0.4 MG/SPRAY TL SOLN: 1.0000 | Status: DC | PRN

## 2015-03-15 ENCOUNTER — Telehealth: Payer: Self-pay | Admitting: Internal Medicine

## 2015-03-15 NOTE — Telephone Encounter (Signed)
Pt aware okay to have mammogram. Pt aware her device is not MRI safe. Pt aware if any invasive procedure is necessary to have her doctor send paperwork to device clinic.

## 2015-03-15 NOTE — Telephone Encounter (Signed)
New message     Pt has a pacemaker.  She is due to have a mammogram of the left breast tomorrow morning.  Will this interfere with her pacemaker?

## 2015-03-16 ENCOUNTER — Encounter: Payer: Self-pay | Admitting: Cardiology

## 2015-03-16 NOTE — Telephone Encounter (Signed)
Transmission shown to Dr Lovena Le and he says no changes for now.  I have left this on the patient's voicemail and have asked her to call me if needed

## 2015-03-17 ENCOUNTER — Encounter: Payer: Self-pay | Admitting: Internal Medicine

## 2015-03-22 ENCOUNTER — Other Ambulatory Visit: Payer: Self-pay | Admitting: Radiology

## 2015-03-23 ENCOUNTER — Telehealth: Payer: Self-pay

## 2015-03-23 NOTE — Telephone Encounter (Signed)
Left a message for [name] [name]  to call back to [facility] regarding [employer].  This nurse was returning her message to call.back about [name] [name].

## 2015-03-24 ENCOUNTER — Encounter: Payer: Self-pay | Admitting: Oncology

## 2015-03-29 ENCOUNTER — Encounter: Payer: Self-pay | Admitting: Oncology

## 2015-03-29 ENCOUNTER — Other Ambulatory Visit: Payer: Self-pay

## 2015-03-29 DIAGNOSIS — R591 Generalized enlarged lymph nodes: Secondary | ICD-10-CM

## 2015-03-29 DIAGNOSIS — R599 Enlarged lymph nodes, unspecified: Secondary | ICD-10-CM

## 2015-04-01 ENCOUNTER — Telehealth: Payer: Self-pay | Admitting: Oncology

## 2015-04-01 ENCOUNTER — Other Ambulatory Visit: Payer: Self-pay | Admitting: Oncology

## 2015-04-01 NOTE — Telephone Encounter (Signed)
Called patient and she is aware of her appointment

## 2015-04-05 ENCOUNTER — Telehealth: Payer: Self-pay | Admitting: Nurse Practitioner

## 2015-04-05 NOTE — Telephone Encounter (Signed)
Patient calling to inquire about upcoming appointments r/t recently diagnosed breast cancer. Patient appears to have complex health needs with multiple healthcare providers. She is due to see Dr. Marko Plume on 04/28/15 but breast surgery with Dr. Donne Hazel is not yet scheduled. She sees this provider on Friday and hopes to schedule surgery at that time. Pt states she will get cardiac clearance at next cardio apt but wonders if she will need clearance from Dr. Marko Plume to have breast surgery. Rn informed her not likely but will inform Dr. Marko Plume of this question. Patient is hoping breast pathology will be known by the time she is scheduled to see Dr. Marko Plume on 04/28/15. RN informed patient that I will pass this information on to MD and Breast RN navigator to ensure timely and efficient management of her new health care needs. Patient encouraged to call clinic with updates or any new changes in the meantime.

## 2015-04-06 ENCOUNTER — Telehealth: Payer: Self-pay

## 2015-04-06 ENCOUNTER — Ambulatory Visit: Payer: Medicare Other | Admitting: Nurse Practitioner

## 2015-04-06 NOTE — Telephone Encounter (Signed)
Patient Demographics     Patient Name Sex DOB SSN Address Phone    [name] [name], [place] Female Apr 17, 1951 [id number] [place] [address] [phone] (Home) *[phone] (Mobile)      Message  Received: Norman Herrlich, MD  Alla Feeling, RN; Baruch Merl, RN Cc: Rockwell Germany, RN           Please let patient know that Drs Donne Hazel, Aundra Dubin and I have discussed type of surgery, timing of surgery, cardiology concerns including timing off Brilinta and other considerations including diabetes, previous radiation, recovery time etc.. [name] will update her at his visit this week, and [name] and I will talk with her at our upcoming appointments. 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 If I have cancellation prior to Oct 6, I am glad to see her sooner, but that is first available now. If she wants me to speak with her by phone, I can certainly do that, but we have been working out recommendations among the specialists during past week. If Dr Claris Gladden office has not told her an appointment yet, I know that he plans to schedule this before surgery.   (I'm glad for Varney Biles to know, but am not sure anything for her to assist with at present - thank you)       Previous Messages     ----- Message -----   From: Alla Feeling, RN   Sent: 04/05/2015  4:22 PM    To: Rockwell Germany, RN, Baruch Merl, RN, *   Phone call from patient with newly diagnosed breast cancer:   Patient calling to inquire about upcoming appointments r/t recently diagnosed breast cancer. Patient appears to have complex health needs with multiple healthcare providers. She is due to see Dr. Marko Plume on 04/28/15 but breast surgery with Dr. Donne Hazel is not yet scheduled. She sees this provider on Friday and hopes to schedule surgery at that time. Pt  states she will get cardiac clearance at next cardio apt but wonders if she will need clearance from Dr. Marko Plume to have breast surgery. Rn informed her not likely but will inform Dr. Marko Plume of this question. Patient is hoping breast pathology will be known by the time she is scheduled to see Dr. Marko Plume on 04/28/15. RN informed patient that I will pass this information on to MD and Breast RN navigator to ensure timely and efficient management of her new health care needs. Patient encouraged to call clinic with updates or any new changes in the meantime.   FYI: Patient is having ultrasound of liver at [facility] 04/06/15; her team wants to evaluate this prior to breast surgery

## 2015-04-06 NOTE — Telephone Encounter (Signed)
Left message in vm regarding surgery and appointment for Dr. Marko Plume on 04-21-15 at 3 pm lab and 3:30 for visit.  Requested Ms. Belfield call back to confirm appt 04-21-15 would be fine with her schedule.

## 2015-04-06 NOTE — Telephone Encounter (Signed)
Told Ms. Ohms the information noted below by Dr. Marko Plume regarding her appointment etc. Pt. appreciated the phone call as this information eases her mind.   She is to see Dr. Donne Hazel on Friday 04-08-15 and Dr. Aundra Dubin on 03-13-15. Ms. Krenz is inquiring as to whether or not the 04-28-15 appointment  with Dr. Marko Plume would delay surgery if it can be done prior to 04-28-15? Told her that this question would be fowarded to Dr. Marko Plume and she will be notified of Dr. Mariana Kaufman response.

## 2015-04-06 NOTE — Telephone Encounter (Signed)
-----   Message from [name], MD sent at 04/06/2015 12:18 PM EDT ----- Re timing of surgery  Please tell patient: [name] does not want to rush surgery - optimal for him would be Nov after full year from stent and on Brilinta. [name] mentioned maybe late Oct as compromise. These dates are absolutely fine with DCIS.  My afternoon new pt spot on 9-29 is open. Please move patient there x 45 min + lab if that is ok with her other appointments  thanks

## 2015-04-07 NOTE — Telephone Encounter (Signed)
Return call received from Ms. Pallett confirming appointment with Dr. Marko Plume on 04-21-2015 at 3:00 pm.

## 2015-04-08 ENCOUNTER — Other Ambulatory Visit: Payer: Self-pay | Admitting: General Surgery

## 2015-04-13 ENCOUNTER — Encounter: Payer: Self-pay | Admitting: Cardiology

## 2015-04-13 ENCOUNTER — Other Ambulatory Visit: Payer: Self-pay | Admitting: *Deleted

## 2015-04-13 ENCOUNTER — Encounter: Payer: Self-pay | Admitting: *Deleted

## 2015-04-13 ENCOUNTER — Ambulatory Visit (INDEPENDENT_AMBULATORY_CARE_PROVIDER_SITE_OTHER): Payer: Medicare Other | Admitting: Cardiology

## 2015-04-13 ENCOUNTER — Ambulatory Visit: Payer: Medicare Other | Admitting: Nurse Practitioner

## 2015-04-13 VITALS — BP 124/62 | HR 72 | Ht 63.0 in | Wt 121.0 lb

## 2015-04-13 DIAGNOSIS — I2510 Atherosclerotic heart disease of native coronary artery without angina pectoris: Secondary | ICD-10-CM

## 2015-04-13 DIAGNOSIS — I25110 Atherosclerotic heart disease of native coronary artery with unstable angina pectoris: Secondary | ICD-10-CM

## 2015-04-13 DIAGNOSIS — I480 Paroxysmal atrial fibrillation: Secondary | ICD-10-CM

## 2015-04-13 DIAGNOSIS — I495 Sick sinus syndrome: Secondary | ICD-10-CM

## 2015-04-13 DIAGNOSIS — I350 Nonrheumatic aortic (valve) stenosis: Secondary | ICD-10-CM

## 2015-04-13 DIAGNOSIS — I4891 Unspecified atrial fibrillation: Secondary | ICD-10-CM | POA: Diagnosis not present

## 2015-04-13 LAB — CBC
HEMATOCRIT: 39.8 % (ref 36.0–46.0)
HEMOGLOBIN: 13.6 g/dL (ref 12.0–15.0)
MCHC: 34.3 g/dL (ref 30.0–36.0)
MCV: 90.6 fl (ref 78.0–100.0)
Platelets: 128 10*3/uL — ABNORMAL LOW (ref 150.0–400.0)
RBC: 4.39 Mil/uL (ref 3.87–5.11)
RDW: 14.1 % (ref 11.5–15.5)
WBC: 9.1 10*3/uL (ref 4.0–10.5)

## 2015-04-13 LAB — BASIC METABOLIC PANEL
BUN: 28 mg/dL — ABNORMAL HIGH (ref 6–23)
CHLORIDE: 101 meq/L (ref 96–112)
CO2: 34 meq/L — AB (ref 19–32)
CREATININE: 0.87 mg/dL (ref 0.40–1.20)
Calcium: 10.5 mg/dL (ref 8.4–10.5)
GFR: 70.12 mL/min (ref >60.00)
Glucose, Bld: 185 mg/dL — ABNORMAL HIGH (ref 70–99)
POTASSIUM: 3.6 meq/L (ref 3.5–5.1)
Sodium: 143 mEq/L (ref 135–145)

## 2015-04-13 NOTE — Progress Notes (Signed)
Patient ID: [name] [name], female   DOB: 1951/03/05, 64 y.o.   MRN: [id number] PCP: Dr. Orson Ape Oncologist: Dr Marko Plume  64 yo with history of CAD s/p CABG in 12/05 and redo in 1/11, bioprosthetic AVR, diastolic CHF, and sick sinus syndrome s/p PCM presents for cardiology followup.  She had initial CABG with SVG-RCA in 12/05 followed by redo SVG-RCA and bioprosthetic AVR in 1/11.  She has had diastolic CHF and is on Lasix.  In 9/14, she had a CPX test.  This showed severe functional limitation with ischemic ECG changes (inferolateral ST depression and ST elevation in V1 and V2). She had chest pain.  I took her for [facility] in 9/14.  This showed elevated left and right heart filling pressures, patent SVG-PDA, and 80% distal LM/80% ostial LAD stenosis.  Patient was deemed not a candidate for redo CABG (had 2 prior sternotomies as well as chest radiation for Hodgkins lymphoma).  She had DES from left main into proximal LAD and will need long-term DAPT => Brilinta was used as she has been a poor responder to Plavix in the past.  She re-developed angina in 11/15 and was admitted with unstable angina.  LHC showed 80% ostial LM and patent SVG-RCA. She had DES to ostial LM. Last echo in 11/15 showed EF 55-60%, normal bioprosthetic aortic valve, mild-moderate MR, moderate TR.  Since that time, she has had no more chest pain.  No exertional dyspnea unless she is walking up a hill.    She has chronic iron deficiency anemia thought to be due to a slow GI bleed.  Last EGD was unremarkable and a capsule endoscopy was also unremarkable.  She has periodic transfusions; However, recently hemoglobin has been stable.  No BRBPR.   She has an insulin pump followed by an endocrinologist at [facility].   Given myalgias with statins, she was started on Praluent.  LDL is now excellent.  Of note, transaminases have risen a bit.   She has been having more palpitations over the last few months.  Device interrogation has shown occasional  brief atrial fibrillation runs.  She is not anticoagulated.   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  She needs mastectomy on that side.  She has been seeing [name].    Labs (7/14): hemoglobin 11 Labs (9/14): K 3.9, creatinine 1.48, hemoglobin 7.3 Labs (10/14): hemoglobin 7.7 Labs (11/14): K 3.8, creatinine 1.5 Labs (12/14): HCT 37.7 Labs (1/15): LDL 106, HDL 41, K 3.9, creatinine 1.6, LFTs normal Labs (2/15): K 3.7, creatinine 1.1, BNP 71 Labs (3/15): HCT 36.7 Labs (4/15): K 3.7, creatinine 1.1, HCT 35.5 Labs (7/15): HCT 36.4 Labs (11/15): HCT 32.1, K 4.2, creatinine 1.06 Labs (12/15): hgb 12 Labs (1/16): HCT 37.1 Labs (3/16): LDL 25, HDL 42, elevated transaminases Labs (6/16): HCT 39.6, plts 146 Labs (7/16): LDL 59, HDL 41, AST 34, ALT 49, AP 125  ECG: NSR, 1st degree AV block  PMH: 1. Sick sinus syndrome s/p [employer] PCM.  2. HTN 3. H/o SBO 4. Renal cell carcinoma s/p right nephrectomy in 2008.  5. 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  Fibroscan in 11/14 showed advanced fibrosis concerning for cirrhosis. EGD in 11/14 showed no varices.  6. Diastolic CHF: Echo (1/69) with EF 60-65%, bioprosthetic aortic valve with mean gradient 15 mmHg, mild MR.  CPX (9/14): peak VO2 10.4, VE/VCO2 43.8, inferolateral ST depression and ST elevation in V1/V2 with severe dyspnea and chest pain, severe  functional limitation with ischemic changes.  Echo (9/14) with EF 60-65%, normal RV size and systolic function, mild-moderate MR, bioprosthetic aortic valve normal, PA systolic pressure 51 mmHg, no evidence for pericardial constriction though exam incomplete for constriction.  Echo (11/15) with EF 55-60%, bioprosthetic aortic valve, mild-moderate MR, moderate TR, PA systolic pressure 42 mmHg.  7. TAH-BSO 8. Type II diabetes: Has insulin pump.  9. H/o ischemic colitis. 10. H/o PUD. 11. Diabetic gastroparesis. 12. Hyperlipidemia: Myalgias with Crestor and  atorvastatin, elevated LFTs with simvastatin.  Myalgias with > 10 mg pravastatin.  13. CKD 14. Bicuspid aortic valve s/p 19 mm Edwards pericardial valve in 1/11 (had post-operative Dresslers syndrome).   15. CAD: CABG with SVG-RCA in 12/05.  Redo SVG-RCA in 1/11 with AVR.  LHC/RHC (9/14) with mean RA 18, PA 62/25 mean 43, mean PCWP 26, CI 2.8, 70-80% distal LM stenosis, 80% ostial LAD stenosis, total occlusion RCA, SVG-PDA patent.  Patient had DES LM into LAD.  Plan to continue long-term ASA/Brilinta (poor responder to Plavix).  ETT-cardiolite (4/15) with 6' exercise, EF 60%, ST depression in recovery and mild chest pain, no ischemia or infarction by perfusion images.  Unstable angina 11/15 with 80% LM, patent SVG-RCA => DES to ostial LM.   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  17. Anemia: Iron-deficiency.  Suspect chronic GI blood loss.  EGD in 9/14 was unremarkable.  Capsule endoscopy 10/14 was unremarkable.  Has required periodic blood transfusions.  18. Atrial fibrillation: Paroxysmal, noted by [facility] interrogation.  19. Carotid stenosis: Carotid dopplers (2/15) with 40-59% bilateral stenosis. Carotid dopplers (3/16) with 40-59% bilateral ICA stenosis.  20. 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   FH: CAD  ROS: All systems reviewed and negative except as per HPI.   Current Outpatient Prescriptions  Medication Sig Dispense Refill  . Alirocumab 75 MG/ML SOPN Inject 75 mg into the skin as directed. Every 4 weeks    . ALPRAZolam (XANAX) 0.5 MG tablet Take 0.25-0.5 mg by mouth 2 (two) times daily as needed for anxiety.     . APIDRA 100 UNIT/ML injection INFUSE THROUGH INSULIN PUMP UTD. TOTAL DAILY DOSE OF 225 UNITS INCLUDING PRIMING  2  . aspirin EC 81 MG tablet Take 81 mg by mouth at bedtime.    . Calcium  Carbonate-Vitamin D (RA CALCIUM PLUS VITAMIN D) 600-400 MG-UNIT per tablet Take 1 tablet by mouth 2 (two) times daily.     . Cholecalciferol 2000 UNITS TABS Take 1 tablet by mouth daily.    . clobetasol (TEMOVATE) 0.05 % external solution Apply 1 application topically daily as needed (dermatosis).    Marland Kitchen desoximetasone (TOPICORT) 0.05 % cream Apply 1 application topically 2 (two) times daily as needed. When skin starts peeling    . diclofenac sodium (VOLTAREN) 1 % GEL Apply topically as needed.     . docusate sodium (COLACE) 100 MG capsule Take 1 capsule (100 mg total) by mouth 2 (two) times daily. (Patient taking differently: Take 100 mg by mouth daily. ) 60 capsule 3  . ferrous fumarate (HEMOCYTE - 106 MG FE) 325 (106 FE) MG TABS tablet Take 1 tablet (106 mg of iron total) by mouth 2 (two) times daily. Or as directed. Take with Vitamin -C 100 each 2  . fluticasone (FLONASE) 50 MCG/ACT nasal spray Place 2 sprays into the nose daily as  needed for rhinitis. For nasal congestion    . furosemide (LASIX) 80 MG tablet Take 1 tablet (80 mg total) by mouth daily. 90 tablet 3  . glucagon (GLUCAGON EMERGENCY) 1 MG injection Inject 1 mg into the vein once as needed (for severe reaction).    . Insulin Human (INSULIN PUMP) 100 unit/ml SOLN Inject into the skin continuous. Apidra Basal Rate midnight-0700 : 0.55 700-830: 2.25 830- 1930: 2.5 1930-midnight: 3.2    . isosorbide mononitrate (IMDUR) 60 MG 24 hr tablet Take 1 tablet (60 mg total) by mouth daily. 90 tablet 3  . Magnesium 250 MG TABS Take 250 mg by mouth daily.     . metoprolol (LOPRESSOR) 50 MG tablet Take 1.5 tablets (75 mg total) by mouth 2 (two) times daily. 270 tablet 6  . Multiple Vitamins-Minerals (MULTIVITAMIN WITH MINERALS) tablet Take 1 tablet by mouth daily.      . nitroGLYCERIN (NITROLINGUAL) 0.4 MG/SPRAY spray Place 1 spray under the tongue every 5 (five) minutes as needed for chest pain. 12 g 1  . omeprazole (PRILOSEC) 20 MG capsule Take  20 mg by mouth daily.    . ONE TOUCH ULTRA TEST test strip     . polyethylene glycol (MIRALAX / GLYCOLAX) packet Take 8.5 g by mouth daily as needed. For constipation    . potassium chloride SA (K-DUR,KLOR-CON) 20 MEQ tablet Take 40 mEq by mouth every morning.    . pravastatin (PRAVACHOL) 10 MG tablet TAKE ONE (1) TABLET BY MOUTH EVERY DAY 90 tablet 3  . ticagrelor (BRILINTA) 90 MG TABS tablet Take 1 tablet (90 mg total) by mouth 2 (two) times daily. 180 tablet 5  . vitamin C (ASCORBIC ACID) 500 MG tablet Take 250 mg by mouth daily.     . Wheat Dextrin (BENEFIBER PO) Take 45 mLs by mouth every morning. 3 tablespoons each morning    . azithromycin (ZITHROMAX) 500 MG tablet 1 tablet by mouth one hour prior to dental appointment/procedure (Patient not taking: Reported on 01/10/2015) 2 tablet 0   No current facility-administered medications for this visit.    BP 124/62 mmHg  Pulse 72  Ht 5' 3"  (1.6 m)  Wt 121 lb (54.885 kg)  BMI 21.44 kg/m2 General: NAD Neck: JVP 7 cm, no thyromegaly or thyroid nodule.  Lungs: Clear to auscultation bilaterally with normal respiratory effort. CV: Nondisplaced PMI.  Heart regular S1/S2, no S3/S4, 1/6 early systolic murmur RUSB.  No peripheral edema.  Left carotid bruit.  2+ PT pulses bilaterally.  Abdomen: Soft, nontender, no hepatosplenomegaly, no distention.  Skin: Intact without lesions or rashes.  Neurologic: Alert and oriented x 3.  Psych: Normal affect. Extremities: No clubbing or cyanosis.   Assessment/Plan: 1. CAD: s/p SVG-RCA in 12/05, then redo SVG-RCA in 1/11.  She was found to have distal left main/proximal LAD severe stenosis in 9/14.  She was very symptomatic.  PCI with DES to distal LM/proximal LAD was done rather than bypass as she would have been a poor candidate for redo sternotomy (2 prior sternotomies and history of chest wall radiation for Hodgkins disease).  Recurrent unstable angina with 80% ostial LM treated with DES in 11/15.  No chest  pain since that time.  She will need to be on Brilinta and ASA (poor responder to plavix) long-term, can decrease Brilinta to 60 mg bid in 11/16.  This is complicated by suspected slow GI bleed. Hemoglobin stable recently.  - Continue ASA 81, statin, metoprolol, Brilinta, Imdur without changes.  2. Bioprosthetic aortic valve replacement: Stable on last echo.  3. Diastolic CHF: Euvolemic on exam, EF normal on 11/15 echo. Continue current Lasix. 4. CKD:  Patient has a single kidney s/p right nephrectomy.  She sees nephrology. Creatinine has been stable. BMET today.  5. Hyperlipidemia: She has not been able to tolerate any higher potency statin than pravastatin 10 mg daily.  She is now on Praluent with excellent LDL.  However, mild elevation in transaminases recently.    6. Anemia: Suspected slow GI bleed.  Unremarkable EGD and capsule endoscopy in fall 2014.  Unfortunately, needs to stay on Brilinta long-term.  Will need to follow closely (sees hematology), plan for transfusion with hemoglobin < 8 as this allows best control of her symptoms. Check CBC today.   7. Atrial fibrillation: Paroxysmal, brief episodes noted on PCM interrogation.  Given GI bleeding and need to be on both ASA 81 and Brilinta, risks likely outweigh benefits of anticoagulation.  Watchman device would be a potential option for her.  Will need to see if she had LA appendage ligation with either of her cardiac surgeries.  8. Carotid bruit: Mild to moderate bilateral stenosis.  Repeat in 3/17. 9. SSS: Pacemaker in place.  10. DCIS: Left breast, needs mastectomy.  She had her 2nd DES to left main in 11/15.  Ideally, would continue uninterrupted Brilinta x 1 year (then decrease to 60 bid).  If Drs Marko Plume and Donne Hazel feel that it is important to do the surgery sooner due to risk of progression, then we will have to hold Brilinta early.  She will be at higher risk of complication but not prohibitive. Would try to minimize anesthesia time.     [name] 04/13/2015

## 2015-04-13 NOTE — Patient Instructions (Signed)
Your physician recommends that you continue on your current medications as directed. Please refer to the Current Medication list given to you today.  Your physician recommends that you return for lab work in: today (BMET, CBC)  Your physician recommends that you schedule a follow-up appointment in: 2 months with Dr. Aundra Dubin.

## 2015-04-14 ENCOUNTER — Telehealth: Payer: Self-pay | Admitting: Oncology

## 2015-04-14 ENCOUNTER — Other Ambulatory Visit: Payer: Self-pay | Admitting: Oncology

## 2015-04-14 NOTE — Telephone Encounter (Signed)
Spoke with patient per pof and Dr Marko Plume that she needs to keep the appointment on 9/29. Patient had many questions and made many statements about the plan and now a new issue at bx site.  Dr Marko Plume is aware and will have Tammy call the patient.

## 2015-04-15 ENCOUNTER — Telehealth: Payer: Self-pay

## 2015-04-15 NOTE — Telephone Encounter (Signed)
Left a message regarding coordiation of care between practioners as noted below by Dr. Marko Plume.  Pt. Scheduled for lab and visit with Dr. Marko Plume on 04-21-15 at 1500.  This done on 04-06-15 with patient's initial concerns with new diagnosis and her co morbidities. Stated that she could call the office back on Monday 04-18-15 if she has further concerns or questions.

## 2015-04-15 NOTE — Telephone Encounter (Signed)
-----   Message from [name], MD sent at 04/14/2015  4:46 PM EDT ----- [name] [name] spoke with her on 9-22 to be sure we were ok with apts, and patient again with concerns Could you please call her again on 9-23 - reassure her that MDs all very involved, including long note to me and [name] from cardiology today and correspondence from [name] to cardiology cc to me also today. I can see her on 9-30 (just opened for urgent new pts) if she prefers that to 10-3, need 45 min if so in afternoon as new patients going in AM. From my standpoint and surgery standpoint, 10-3 as set up is absolutely fine, but if she needs me to see the breast biopsy area (maybe still oozing) or just needs to discuss before 10-3, please use 9-30.  thanks

## 2015-04-17 ENCOUNTER — Other Ambulatory Visit: Payer: Self-pay | Admitting: Oncology

## 2015-04-17 DIAGNOSIS — D509 Iron deficiency anemia, unspecified: Secondary | ICD-10-CM

## 2015-04-21 ENCOUNTER — Other Ambulatory Visit: Payer: Medicare Other

## 2015-04-21 ENCOUNTER — Ambulatory Visit (HOSPITAL_BASED_OUTPATIENT_CLINIC_OR_DEPARTMENT_OTHER): Payer: Medicare Other | Admitting: Oncology

## 2015-04-21 ENCOUNTER — Encounter: Payer: Self-pay | Admitting: Oncology

## 2015-04-21 VITALS — BP 123/52 | HR 78 | Temp 97.7°F | Resp 18 | Ht 63.0 in | Wt 125.7 lb

## 2015-04-21 DIAGNOSIS — C641 Malignant neoplasm of right kidney, except renal pelvis: Secondary | ICD-10-CM

## 2015-04-21 DIAGNOSIS — Z923 Personal history of irradiation: Secondary | ICD-10-CM

## 2015-04-21 DIAGNOSIS — D500 Iron deficiency anemia secondary to blood loss (chronic): Secondary | ICD-10-CM

## 2015-04-21 DIAGNOSIS — D696 Thrombocytopenia, unspecified: Secondary | ICD-10-CM

## 2015-04-21 DIAGNOSIS — T66XXXD Radiation sickness, unspecified, subsequent encounter: Secondary | ICD-10-CM

## 2015-04-21 DIAGNOSIS — D0512 Intraductal carcinoma in situ of left breast: Secondary | ICD-10-CM

## 2015-04-21 DIAGNOSIS — E118 Type 2 diabetes mellitus with unspecified complications: Secondary | ICD-10-CM

## 2015-04-21 DIAGNOSIS — M8580 Other specified disorders of bone density and structure, unspecified site: Secondary | ICD-10-CM

## 2015-04-21 DIAGNOSIS — Z8571 Personal history of Hodgkin lymphoma: Secondary | ICD-10-CM

## 2015-04-21 NOTE — Progress Notes (Signed)
OFFICE PROGRESS NOTE   April 21, 2015   Physicians: M.Donne Hazel, W.McGough, [name] (endocrine [facility]), [name], D.McLean, G.Lovena Le, B.Bartle, A.Deal (GI [facility]), [name]  INTERVAL HISTORY:  Patient is seen, together with husband, for discussion of recent diagnosis of DCIS left breast, treatment planning being coordinated by multiple physicains given her complicated medical situation.  The DCIS was identified on screening tomo mammograms done at [facility] 03-14-15, with heterogeneously dense breast tissue, heterogeneous calcification left breast at 3:00 posterior and otherwise no findings of concern. She had left diagnostic mammogram at [facility] 03-16-15 with suspicious grouped calcifications identified. She had tomo stereotactic needle core biopsy by [name] at [facility] on 03-21-14, seven cores submitted, pathology DCIS with necrosis, grade 2, ER PR negative  [facility] [id number]). She did have extensive bruising and hematoma at left breast after the needle biopsies, with most visible bruising resolved now. She had [name] left axilla at [facility] 03-29-15 with no evidence of malignancy; radiology reports will be scanned into this EMR.  Patient was seen in consultation by Dr Serita Grammes on 03-29-15, who has been in close communication with myself and with Dr [name] about treatment options and timing of these. Radiation oncology reviewed the case at [facility], with no radiation possible to left breast given prior mantle radiation for Hodgkins and her cardiac disease.  Although [name] originally discussed left mastectomy with prophylactic right mastectomy, physician consensus is that unilateral left mastectomy is preferable to allow the surgery to be done as quickly as possible and to minimize necessary wound healing.  Patient understands that the surgery will not include axillary node evaluation. These recommendation take into account her significant cardiac disease  and brittle, insulin dependent diabetes. Note she had second left main DES in 05-2014, such that optimally she would be on Brilinta + ASA for a full year after that stent prior to interruption; [name] does feel that risk of surgery shortly prior to full year would be higher but not prohibitive, does recommend minimizing anesthesia time.   She saw [name] on 04-13-15. Praluent has significantly improved LDL.  She has had intermittent GI bleeding in past year, but stable now. Brilinta and ASA were not held for that bleeding; she continues ferrous fumarate, last iron studies in 10-2014 had serum iron 68 and %sat 23. She has had some variable mildly low platelets not significantly changed in past year.  She is followed by Dr [name] endocrinology at [facility] for diabetes on insulin pump and for hypothyroidism on replacement (hypothyroid also related to remote mantle radiation for Hodgkins).  She was seen by endocrine practitioner at [facility] last week. Diabetes is also complicated by gastroparesis.  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   Renal function is good, tho she has also had right nephrectomy for renal cell carcinoma in 2008.  She has pacemaker, so cannot have breast MRI. No PAC Has not had genetics testing. 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 She has radiation induced hypothyroidism and the treatment caused or worsened coronary artery disease. She has had no further active Hodgkins.  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 She is no  longer followed regularly by urology.   Left DCIS diagnosed  03-22-15.   Review of systems: Anxiety about "delay" for surgery. The left breast has had some dull discomfort deep in the breast new since the biopsies, tho extensive external bruising has improved. No recent infectious illness. No angina symptoms. Gastroparesis symptoms much better controlled with interventions from consultant at [facility], now prn follow up there. Hgb A1c lower at recent endocrine follow up. No bleeding otherwise. Bowels unchanged. Energy at baseline. Still some discomfort in knees since fall and left tibia fracture 12-2014.  No changes right breast. Remainder of 10 point Review of Systems negative.  Objective:  Vital signs in last 24 hours:  BP 123/52 mmHg  Pulse 78  Temp(Src) 97.7 F (36.5 C) (Oral)  Resp 18  Ht 5' 3"  (1.6 m)  Wt 125 lb 11.2 oz (57.017 kg)  BMI 22.27 kg/m2  SpO2 99% Weight up 4 lbs Alert, oriented and appropriate. Ambulatory without difficulty.  Normal hair pattern.   HEENT:PERRL, sclerae not icteric. Oral mucosa moist without lesions, posterior pharynx clear.  Neck supple. No JVD.  Lymphatics:no cervical,supraclavicular, axillary adenopathy Resp: clear to auscultation bilaterally and normal percussion bilaterally Cardio: regular rate and rhythm. No gallop. Pacer left anterior chest. GI: soft, nontender, not distended, no mass or organomegaly. Some bowel sounds. Insulin pump in place Musculoskeletal/ Extremities: UE/LEwithout pitting edema, cords, tenderness Neuro: nonfocal   PSYCH appropriate mood and affect Skin without rash, ecchymosis, petechiae Breasts: Right without dominant mass, skin or nipple findings. Left without palpable mass, not tender, minimal discoloration inferiorly from prior biopsy bruising. Axillae benign.   Lab Results:  Labs reviewed from 04-13-15, patient declined repeat CBC now Results for orders placed or performed in visit on 32/99/24  Basic Metabolic Panel (BMET)  Result Value Ref Range   Sodium 143 135 - 145  mEq/L   Potassium 3.6 3.5 - 5.1 mEq/L   Chloride 101 96 - 112 mEq/L   CO2 34 (H) 19 - 32 mEq/L   Glucose, Bld 185 (H) 70 - 99 mg/dL   BUN 28 (H) 6 - 23 mg/dL   Creatinine, Ser 0.87 0.40 - 1.20 mg/dL   Calcium 10.5 8.4 - 10.5 mg/dL   GFR 70.12 >60.00 mL/min  CBC  Result Value Ref Range   WBC 9.1 4.0 - 10.5 K/uL   RBC 4.39 3.87 - 5.11 Mil/uL   Platelets 128.0 (L) 150.0 - 400.0 K/uL   Hemoglobin 13.6 12.0 - 15.0 g/dL   HCT 39.8 36.0 - 46.0 %   MCV 90.6 78.0 - 100.0 fl   MCHC 34.3 30.0 - 36.0 g/dL   RDW 14.1 11.5 - 15.5 %   *Note: Due to a large number of results and/or encounters for the requested time period, some results have not been displayed. A complete set of results can be found in Results Review.     Studies/Results: RUTHIA, PERSON Collected: 03/22/2015 Client: Joyce @ [address]. Accession: [id number] Received: 03/22/2015 Christene Slates, MD GY ADDITIONAL INFORMATION: PROGNOSTIC INDICATORS Results: IMMUNOHISTOCHEMICAL AND MORPHOMETRIC ANALYSIS PERFORMED MANUALLY Estrogen Receptor: 0%, NEGATIVE Progesterone Receptor: 0%, NEGATIVE COMMENT: The negative hormone receptor study(ies) in this case has an internal positive control. REFERENCE RANGE ESTROGEN RECEPTOR NEGATIVE 0% POSITIVE =>1% REFERENCE RANGE PROGESTERONE RECEPTOR NEGATIVE 0% POSITIVE =>1% All controls stained appropriately Mali RUND DO Pathologist, Electronic Signature ( Signed 03/25/2015) FINAL DIAGNOSIS Diagnosis Breast, left, needle core biopsy - DUCTAL CARCINOMA IN SITU WITH NECROSIS, SEE COMMENT. Microscopic Comment Although grade of  tumor is best assessed at resection, with these biopsies, the in situ carcinoma is grade 2.   Reports of [facility] breast imaging as noted above, to be scanned into this EMR  Bone density scan done at [facility] 11-23-2014 with lowest T score right femoral neck -2.4 (osteopenia). Report to be scanned into this EMR.   Medications: I have reviewed the  patient's current medications.  DISCUSSION: all of information above reviewed in detail with patient and husband. She understands that Drs Donne Hazel, Aundra Dubin and I have been in close communication, which she appreciates. I have told her that the DCIS diagnosis can be dealt with in a timely fashion but is not an emergency, this diagnosis much less of a risk overall than other problems that she has had. She had tried to get additional information from drug company re timing of Brilinta interruption, which she was unable to get and which is likely really not available, however I have shared Dr Claris Gladden recommendations in his correspondence as above. She is in agreement with plan for left mastectomy, understands that magnet will be used on the pacer and that inpatient stay will depend on when Brilinta can be resumed and other recovery. She has questions about prostheses, which I have told her are probably best to be fitted after the surgical wound has healed; I have asked the breast clinic RN navigators to follow up with her to discuss further. She has concerns about oral suction used during one of her prior surgeries, which caused local problem with gums (?). Husband mentions that she has small airway. I have asked them to be sure to discuss with anesthesia at preop visit   She understands that I will update all of her physicians with this note.  Assessment/Plan:  1.DCIS left breast: found on screening mammogram and confirmed by stereotactic needle biopsy, grade 2 with necrosis, ER PR negative. Given prior mantle radiation, cardiac disease and diabetes, plan is for left mastectomy. Surgery is scheduled for Oct 25. 2.increased risk for right breast cancer with left DCIS diagnosis and remote mantle radiation: given situation with cardiac disease, we will follow right breast closely with tomo mammography but not complicate or prolong surgery by prophylactic right mastectomy at this procedure.  3.variable, mild  thrombocytopenia for at least a year, possibly medication related. No apparent bleeding now. Could be transfused platelets if low perioperatively. 4.previous anemia related to intermittent slow GI bleeding: stable now x at least 10 months, hemoglobin excellent at 13.6 last week. On oral iron. Colonoscopy 06-2012 no polyps, thought to have had ischemic colitis 2014. NOTE needs lasix if PRBCs. 5.history of CAD s/p CABG in 12/05 and redo in 1/11, bioprosthetic AVR, diastolic CHF, and sick sinus syndrome s/p PCM. DES from left main into proximal LAD 03-2013; re-developed angina in 11/15 with DES to ostial LM. Last echo 11/15  EF 55-60%, normal bioprosthetic aortic valve, mild-moderate MR, moderate TR.On Brilinta and ASA as poor responder to plavix previously. Paroxysmal A fib. Followed closely by [name]. 6.IIA nodular sclerosing Hodgkins treated with mantle radiation in 1980s, not recurrent. Radiation likely or possibly pertinent to hypothyroidism, CAD and this breast cancer. 7.type II diabetes on insulin pump: followed by Dr Frederik Pear at [facility], on insulin pump. Diabetic gastroparesis. 8.Carotid stenosis: Carotid dopplers (2/15) with 40-59% bilateral stenosis. Carotid dopplers (3/16) with 40-59% bilateral ICA stenosis.  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 EGD 05-2013 no varices. 10..post right nephrectomy for renal cell ca 2008. She is followed  yearly by Dr Lorrene Reid of nephrology, but stable without other concerns at visit 03-2015. 11. Hypothyroidism on replacement, also followed by Dr Frederik Pear. 12.osteopenia by bone density scan [facility] 11-23-14 13.post TAH BSO 14.flu vaccine done   All of patient's and husband's questions answered to best of my ability, and she knows to call if other questions or concerns prior to surgery. Time spent 50 min including >50% counseling and coordination of care. Cc Drs Thane Edu, Redland, McGough, Oneita Jolly, MD   04/21/2015,  3:26 PM

## 2015-04-22 ENCOUNTER — Ambulatory Visit: Payer: Medicare Other | Admitting: Nurse Practitioner

## 2015-04-23 ENCOUNTER — Other Ambulatory Visit: Payer: Self-pay | Admitting: Oncology

## 2015-04-23 DIAGNOSIS — D0512 Intraductal carcinoma in situ of left breast: Secondary | ICD-10-CM

## 2015-04-23 DIAGNOSIS — D0510 Intraductal carcinoma in situ of unspecified breast: Secondary | ICD-10-CM | POA: Insufficient documentation

## 2015-04-23 DIAGNOSIS — D696 Thrombocytopenia, unspecified: Secondary | ICD-10-CM | POA: Insufficient documentation

## 2015-04-23 DIAGNOSIS — Z923 Personal history of irradiation: Secondary | ICD-10-CM | POA: Insufficient documentation

## 2015-04-23 DIAGNOSIS — D500 Iron deficiency anemia secondary to blood loss (chronic): Secondary | ICD-10-CM | POA: Insufficient documentation

## 2015-04-25 ENCOUNTER — Telehealth: Payer: Self-pay | Admitting: Oncology

## 2015-04-25 ENCOUNTER — Telehealth: Payer: Self-pay | Admitting: *Deleted

## 2015-04-25 ENCOUNTER — Other Ambulatory Visit: Payer: Self-pay | Admitting: General Surgery

## 2015-04-25 NOTE — Telephone Encounter (Signed)
Spoke with patient about some questions she has regarding breast prosthesis and post surgery.  Gave information on [employer] [place] and ABC class.  Informed her I would mail a pamphlet for [employer] [place] [place] and the prescription form.  Contact information given and encouraged her to call with any needs or concerns.

## 2015-04-25 NOTE — Telephone Encounter (Signed)
Mailed a November calendar and letter to patient

## 2015-04-28 ENCOUNTER — Ambulatory Visit: Payer: Medicare Other | Admitting: Oncology

## 2015-04-28 ENCOUNTER — Other Ambulatory Visit: Payer: Medicare Other

## 2015-05-04 ENCOUNTER — Telehealth: Payer: Self-pay | Admitting: Cardiology

## 2015-05-04 NOTE — Telephone Encounter (Signed)
New problem    Pt need to know due to her  Having surgery when do she take Pralunt because she always take it every other Tuesday. Please advise.Surgery date 10.25.16.

## 2015-05-04 NOTE — Telephone Encounter (Signed)
Advised patient that per Fuller Canada, Pharm-D, she may take the Praluent anytime within the week that it is due.  She verbalized understanding and agreement and states she will probably take it on Monday prior to surgery.  I advised her to call back with questions or concerns.  She thanked me for the call.

## 2015-05-04 NOTE — Telephone Encounter (Signed)
Spoke with patient who states she will have surgery and 1-2 day post hospitalization on 10/25.  She would like to know the pharmacist's preference for her regular dose of Praluent - should she take it a day early, a day late, or skip a week.  I advised her that I will discuss with our pharmacist and call her back.  She also requests to reschedule October appointment to November per Dr. Aundra Dubin request at last ov;  states something was wrong with computers when she went to check out that day.   I rescheduled her appointment to 11/30.

## 2015-05-05 ENCOUNTER — Telehealth: Payer: Self-pay | Admitting: Cardiology

## 2015-05-05 ENCOUNTER — Ambulatory Visit (INDEPENDENT_AMBULATORY_CARE_PROVIDER_SITE_OTHER): Payer: Medicare Other | Admitting: *Deleted

## 2015-05-05 ENCOUNTER — Other Ambulatory Visit: Payer: Self-pay | Admitting: General Surgery

## 2015-05-05 DIAGNOSIS — I495 Sick sinus syndrome: Secondary | ICD-10-CM | POA: Diagnosis not present

## 2015-05-05 NOTE — Progress Notes (Signed)
Remote pacemaker transmission.   

## 2015-05-05 NOTE — Telephone Encounter (Signed)
Spoke with pt and reminded pt of remote transmission that is due today. Pt verbalized understanding.   

## 2015-05-06 LAB — CUP PACEART REMOTE DEVICE CHECK
Brady Statistic AP VP Percent: 0 %
Brady Statistic AP VS Percent: 0 %
Brady Statistic AS VP Percent: 0 %
Brady Statistic AS VS Percent: 100 %
Date Time Interrogation Session: 20161013153928
Implantable Lead Location: 753859
Implantable Lead Model: 5076
Lead Channel Impedance Value: 617 Ohm
Lead Channel Pacing Threshold Amplitude: 0.5 V
Lead Channel Pacing Threshold Amplitude: 0.75 V
Lead Channel Pacing Threshold Pulse Width: 0.4 ms
Lead Channel Sensing Intrinsic Amplitude: 16 mV
Lead Channel Sensing Intrinsic Amplitude: 2.8 mV
Lead Channel Setting Pacing Amplitude: 1.625
Lead Channel Setting Sensing Sensitivity: 5.6 mV
MDC IDC LEAD IMPLANT DT: 20140130
MDC IDC LEAD IMPLANT DT: 20140130
MDC IDC LEAD LOCATION: 753860
MDC IDC MSMT BATTERY IMPEDANCE: 137 Ohm
MDC IDC MSMT BATTERY REMAINING LONGEVITY: 163 mo
MDC IDC MSMT BATTERY VOLTAGE: 2.8 V
MDC IDC MSMT LEADCHNL RA IMPEDANCE VALUE: 474 Ohm
MDC IDC MSMT LEADCHNL RV PACING THRESHOLD PULSEWIDTH: 0.4 ms
MDC IDC SET LEADCHNL RV PACING AMPLITUDE: 2.5 V
MDC IDC SET LEADCHNL RV PACING PULSEWIDTH: 0.4 ms

## 2015-05-10 ENCOUNTER — Encounter: Payer: Self-pay | Admitting: Cardiology

## 2015-05-10 ENCOUNTER — Encounter (HOSPITAL_COMMUNITY): Payer: Self-pay

## 2015-05-10 ENCOUNTER — Encounter (HOSPITAL_COMMUNITY)
Admission: RE | Admit: 2015-05-10 | Discharge: 2015-05-10 | Disposition: A | Discharge status: Home / Self Care | Payer: Medicare Other | Source: Ambulatory Visit | Attending: General Surgery | Admitting: General Surgery

## 2015-05-10 ENCOUNTER — Other Ambulatory Visit (HOSPITAL_COMMUNITY): Payer: Self-pay | Admitting: *Deleted

## 2015-05-10 DIAGNOSIS — I129 Hypertensive chronic kidney disease with stage 1 through stage 4 chronic kidney disease, or unspecified chronic kidney disease: Secondary | ICD-10-CM | POA: Diagnosis not present

## 2015-05-10 DIAGNOSIS — E1122 Type 2 diabetes mellitus with diabetic chronic kidney disease: Secondary | ICD-10-CM | POA: Insufficient documentation

## 2015-05-10 DIAGNOSIS — Z880 Allergy status to penicillin: Secondary | ICD-10-CM | POA: Diagnosis not present

## 2015-05-10 DIAGNOSIS — Z79899 Other long term (current) drug therapy: Secondary | ICD-10-CM | POA: Diagnosis not present

## 2015-05-10 DIAGNOSIS — Z923 Personal history of irradiation: Secondary | ICD-10-CM | POA: Insufficient documentation

## 2015-05-10 DIAGNOSIS — Z85528 Personal history of other malignant neoplasm of kidney: Secondary | ICD-10-CM | POA: Diagnosis not present

## 2015-05-10 DIAGNOSIS — Z905 Acquired absence of kidney: Secondary | ICD-10-CM | POA: Diagnosis not present

## 2015-05-10 DIAGNOSIS — C8110 Nodular sclerosis classical Hodgkin lymphoma, unspecified site: Secondary | ICD-10-CM | POA: Insufficient documentation

## 2015-05-10 DIAGNOSIS — C50912 Malignant neoplasm of unspecified site of left female breast: Secondary | ICD-10-CM | POA: Diagnosis not present

## 2015-05-10 DIAGNOSIS — E785 Hyperlipidemia, unspecified: Secondary | ICD-10-CM | POA: Insufficient documentation

## 2015-05-10 DIAGNOSIS — Z951 Presence of aortocoronary bypass graft: Secondary | ICD-10-CM | POA: Diagnosis not present

## 2015-05-10 DIAGNOSIS — I5030 Unspecified diastolic (congestive) heart failure: Secondary | ICD-10-CM | POA: Diagnosis not present

## 2015-05-10 DIAGNOSIS — I25110 Atherosclerotic heart disease of native coronary artery with unstable angina pectoris: Secondary | ICD-10-CM | POA: Insufficient documentation

## 2015-05-10 DIAGNOSIS — Z01818 Encounter for other preprocedural examination: Secondary | ICD-10-CM | POA: Diagnosis not present

## 2015-05-10 DIAGNOSIS — Z9641 Presence of insulin pump (external) (internal): Secondary | ICD-10-CM | POA: Diagnosis not present

## 2015-05-10 DIAGNOSIS — N182 Chronic kidney disease, stage 2 (mild): Secondary | ICD-10-CM | POA: Diagnosis not present

## 2015-05-10 DIAGNOSIS — Z01812 Encounter for preprocedural laboratory examination: Secondary | ICD-10-CM | POA: Insufficient documentation

## 2015-05-10 DIAGNOSIS — Z7982 Long term (current) use of aspirin: Secondary | ICD-10-CM | POA: Insufficient documentation

## 2015-05-10 DIAGNOSIS — K219 Gastro-esophageal reflux disease without esophagitis: Secondary | ICD-10-CM | POA: Insufficient documentation

## 2015-05-10 DIAGNOSIS — I480 Paroxysmal atrial fibrillation: Secondary | ICD-10-CM | POA: Insufficient documentation

## 2015-05-10 LAB — CBC WITH DIFFERENTIAL/PLATELET
BASOS ABS: 0 10*3/uL (ref 0.0–0.1)
Basophils Relative: 0 %
Eosinophils Absolute: 0.2 10*3/uL (ref 0.0–0.7)
Eosinophils Relative: 2 %
HCT: 40.5 % (ref 36.0–46.0)
HEMOGLOBIN: 13 g/dL (ref 12.0–15.0)
LYMPHS ABS: 1.8 10*3/uL (ref 0.7–4.0)
LYMPHS PCT: 17 %
MCH: 30.2 pg (ref 26.0–34.0)
MCHC: 32.1 g/dL (ref 30.0–36.0)
MCV: 94.2 fL (ref 78.0–100.0)
Monocytes Absolute: 0.6 10*3/uL (ref 0.1–1.0)
Monocytes Relative: 6 %
NEUTROS PCT: 75 %
Neutro Abs: 7.6 10*3/uL (ref 1.7–7.7)
Platelets: 120 10*3/uL — ABNORMAL LOW (ref 150–400)
RBC: 4.3 MIL/uL (ref 3.87–5.11)
RDW: 13.5 % (ref 11.5–15.5)
WBC: 10.2 10*3/uL (ref 4.0–10.5)

## 2015-05-10 LAB — COMPREHENSIVE METABOLIC PANEL
ALK PHOS: 140 U/L — AB (ref 38–126)
ALT: 40 U/L (ref 14–54)
AST: 36 U/L (ref 15–41)
Albumin: 4.2 g/dL (ref 3.5–5.0)
Anion gap: 11 (ref 5–15)
BUN: 26 mg/dL — AB (ref 6–20)
CALCIUM: 9.6 mg/dL (ref 8.9–10.3)
CHLORIDE: 106 mmol/L (ref 101–111)
CO2: 26 mmol/L (ref 22–32)
CREATININE: 1.2 mg/dL — AB (ref 0.44–1.00)
GFR, EST AFRICAN AMERICAN: 55 mL/min — AB (ref >60)
GFR, EST NON AFRICAN AMERICAN: 47 mL/min — AB (ref >60)
Glucose, Bld: 113 mg/dL — ABNORMAL HIGH (ref 65–99)
Potassium: 3.8 mmol/L (ref 3.5–5.1)
SODIUM: 143 mmol/L (ref 135–145)
Total Bilirubin: 0.4 mg/dL (ref 0.3–1.2)
Total Protein: 8 g/dL (ref 6.5–8.1)

## 2015-05-10 LAB — GLUCOSE, CAPILLARY: GLUCOSE-CAPILLARY: 132 mg/dL — AB (ref 65–99)

## 2015-05-10 NOTE — Anesthesia Preprocedure Evaluation (Addendum)
Anesthesia Evaluation  Patient identified by MRN, date of birth, ID band Patient awake    History of Anesthesia Complications (+) PONV  Airway Mallampati: I       Dental  (+) Teeth Intact   Pulmonary    breath sounds clear to auscultation       Cardiovascular hypertension, + angina + CAD, + Peripheral Vascular Disease and +CHF  + dysrhythmias + pacemaker + Valvular Problems/Murmurs  Rhythm:Regular Rate:Normal  AVR   Neuro/Psych    GI/Hepatic PUD, GERD  ,(+) Hepatitis -Hx of gastroparesis   Endo/Other  diabetes, Poorly Controlled, Type 1, Insulin Dependent  Renal/GU Renal InsufficiencyRenal disease     Musculoskeletal   Abdominal   Peds  Hematology  (+) anemia ,   Anesthesia Other Findings   Reproductive/Obstetrics                      Anesthesia Physical Anesthesia Plan  ASA: IV  Anesthesia Plan: General   Post-op Pain Management: GA combined w/ Regional for post-op pain   Induction: Intravenous  Airway Management Planned: Oral ETT  Additional Equipment:   Intra-op Plan:   Post-operative Plan: Extubation in OR  Informed Consent: I have reviewed the patients History and Physical, chart, labs and discussed the procedure including the risks, benefits and alternatives for the proposed anesthesia with the patient or authorized representative who has indicated his/her understanding and acceptance.   Dental advisory given  Plan Discussed with: Surgeon and CRNA  Anesthesia Plan Comments: (See my anesthesia note incluing anesthesia concerns, etc. Dr. Donne Hazel requests pectoral block. Myra Gianotti, PA-C)   Anesthesia Quick Evaluation

## 2015-05-10 NOTE — Progress Notes (Addendum)
Anesthesia PAT Evaluation: Patient is a 64 year old female scheduled for left total mastectomy on 05/17/15 by Dr. Donne Hazel. OR room is booked from 7:30-9:29 AM.  History includes left breast cancer, CAD s/p CABG 06/1999 with redo CABG (SVG-RCA) with AVR for critical AS (19 mm Edwards, bioprosthetic) in 07/27/09 and s/p DES LM into proximal LAD 69/67/89, diastolic CHF, PAF, SSS with syncope s/p [employer] Adapta L dual chamber PPM 08/21/12, iron deficiency anemia with slow GI bleed, DM2 on insulin pump, gastroparesis, never smoker, HLD, HTN, renal cell carcinoma s/p right nephrectomy 06/11/07 (Dr. Alinda Money), CKD stage II, IIA sclerosing Hodgkin's lymphoma s/p mantle radiation '80's with radiation induced hypothyroidism, NASH, migraines, GERD, ischemic colitis, statin intolerance, anxiety, hysterectomy, thyroid nodules (followed by Dr. Frederik Pear).   Anesthesia concerns: 1) Post-operative N/V. 2) Hypotension (reportedly had to limit Versed during LHC due to hypotension). 3) Has required soft neck brace for supportive comfort following surgery. Had skiing accident with "whiplash" 30 years ago. Noticed aggravating symptoms following surgery. Last two surgeries in 2008 and 2011 she did better because she spoke with the anesthesiologist about limiting neck hyperextension during intubation. Anesthesia records requested, but are pending.   PCP is Dr. Sharilyn Sites. HEM-ONC is Dr. Marko Plume. Endocrinologist is Dr. [name] [facility]). Nephrologist is Dr. Lorrene Reid with [facility]. GI is Dr. Doristine Devoid with [facility]. EP Cardiologist is Dr. Lovena Le. Cardiologist is Dr. Aundra Dubin.  According to Dr. Claris Gladden last visit on 04/13/15: "DCIS: Left breast, needs mastectomy. She had her 2nd DES to left main in 11/15. Ideally, would continue uninterrupted Brilinta x 1 year (then decrease to 60 bid). If Drs Marko Plume and Donne Hazel feel that it is important to do the surgery sooner due to risk of progression, then  we will have to hold Brilinta early. She will be at higher risk of complication but not prohibitive. Would try to minimize anesthesia time." (According to Dr. Mariana Kaufman 04/23/15 note, a decision to proceed with left mastectomy only was made based on her cardiac history, IDDM, concern for wound healing with more extensive surgery, and need to minimize surgery time. Patient has been told her risk is higher, but not prohibitive, since she has not been on DAPT for a full year.)  Meds include Alirocumab, Xanax, Apridra (rapid/short acting insulin glulisine), ASA 81 mg, Brilinta (last dose pre-op 05/12/15), Hemocyte, Lasix, glucagon, Imdur, Mag-oxide, Lopressor, Nitro spray, Prilosec, KCL, pravastatin. She is going to check with both Dr. Aundra Dubin and Dr. Donne Hazel about ASA instructions. Since she had a DES places < 1 year ago and is going to hold Brilinta, I would anticipate that Dr. Aundra Dubin would want her to continue ASA if at all possible. She was given preoperative insulin pump instructions per anesthesia guidelines while at her PAT visit, and is going to verify recommendations with Dr. Frederik Pear as well. Since she has a Humalog/Novolog allergy, she is going to bring her Apridra from home as she has done in the past for previous procedures. Pharmacy will have to verify vials.   She has multiple allergies including cephalexin, clindamycin, PCN, gentamycin, neosporin, tizanidine, Crestor, Lipitor,simvastatin, high dose pravastatin, Avandia, metformin, rantiidine, Nickel, Plavix, HUMALOG and NOVOLOG (hives, itching), LIMONENE (topical prep), tramadol, LATEX.   06/07/14 LHC (Dr. Lauree Chandler): Hemodynamic Findings: Central aortic pressure: 113/49 Angiographic Findings: Left main: 80% ostial stenosis with 80% restenosis in the proximal edge of the mid stented segment.  Left Anterior Descending Artery: Large caliber vessel that courses to the apex. Patent proximal stented  segment without restenosis. There are  two small caliber diagonal branches with minimal plaque disease.  Circumflex Artery: Moderate to large caliber vessel with ostial 20% stenosis. Mild plaque in the mid segment.  Right Coronary Artery: Dominant vessel. 100% ostial occlusion (old stents noted in proximal and mid segments). The distal vessel, PDA and PLA fills from the patent vein graft.  Graft Anatomy:  SVG to distal RCA/PDA is patent Left Ventricular Angiogram: Deferred.  Impression: 1. Severe CAD involving the left main ostium, left main stented segment. Known occlusion RCA with patent vein graft to RCA 2. Unstable angina 3. Successful PTCA/DES x 1 ostium of the left main artery Recommendations: Continue ASA and Brilinta for lifetime.  06/07/14 Echo: Study Conclusions - Left ventricle: The cavity size was normal. Wall thickness was normal. Systolic function was normal. The estimated ejection fraction was in the range of 55% to 60%. Wall motion was normal; there were no regional wall motion abnormalities. The study is not technically sufficient to allow evaluation of LV diastolicfunction. - Ventricular septum: Septal motion showed paradox. - Aortic valve: A bioprosthesis was present and functioning normally. - Mitral valve: Calcified annulus. Mildly thickened leaflets. There was mild to moderate regurgitation directed centrally. - Right ventricle: Systolic function was moderately reduced. - Tricuspid valve: There was moderate regurgitation. - Pulmonary arteries: Systolic pressure was mildly increased. PA peak pressure: 42 mm Hg (S).  09/28/14 Carotid duplex: 40-59% BICA stenosis. Normal SCAs bilaterally. Patent vertebral arteries with antegrade flow. Incidental finding of two anechoic avascular structures in the right thyroid measuring 1.1 cm X 1.8 cm and 0.7 cm X 0.8 cm.  06/06/14 CXR: No active cardiopulmonary disease.  Preoperative labs noted. Cr 1.20. Glucose 113. AST/ALT WNL. H/H 13.0/40.5. PLT 120K.  EP Cardiology  has recommended: CONTACT [employer] FOR REPROGRAMMING/RECOMMENDATIONS. (Her PPM is located on the left side, so will be in close proximity to the operative site.) PAT RN to contact [employer]. (Update: Tomi Bamberger notified of surgery date, time, and recommendations. She will plan to have someone come on the morning of surgery.)  Above reviewed with anesthesiologist Dr. Tamala Julian including cardiac and anesthesia history.  Patient looked great at PAT today. She feels at baseline. If no acute changes then it is anticpated that she can proceed as planned. [employer] rep will need to see pre/post op. Since surgery is < 2 hours, hopefully she can continue her insulin pump. DME staff notified that she has an insulin pump and is on Apidra. If I don't see that ASA has been addressed this week then I'll plan to follow-up with patient. (Update: 05/12/15 5:56 PM: Patient verified that she was to continue ASA perioperatively.)  [facility] Short Stay Center/Anesthesiology Phone [phone] 05/10/2015 3:56 PM  Addendum: Several new developments this morning. First Dr. Tanna Furry office requested that Dr. Donne Hazel call and speak with him prior to surgery. Secondly, Dr. Frederik Pear recommending off-pump surgery with Apidra IV. I had actually called and spoke with Dr. Frederik Pear as requested; however, Dr. Donne Hazel has since spoken with Dr. Frederik Pear. He says surgery should only last ~ one hour and that he and Dr. Frederik Pear had agreed that patient could stay on her insulin pump and bring her own Apidra vial as planned. In regards to her PPM, Dr. Lovena Le is out of town this week, but Dr. Donne Hazel has already spoken with Dr. Aundra Dubin (who had also spoken with one of the EP cardiologists) and the agreement was that a magnet would be used intra-operatively for management of  her PPM.   I have updated RN DM Coordinator Sonia Baller and pharmacy. Dr. Donne Hazel plans to call and update the patient.  Also Dr. Donne Hazel requesting pectoral block. I  notified several of our anesthesiologists since patient will be on ASA and has a left PPM, and they felt a lateral pectoral block could be still be considered. Her assigned anesthesiologist will evaluate patient and can discuss with Dr. Donne Hazel on the day of surgery. (She will have been off Brilinta for > 5 days.)  [facility] Short Stay Center/Anesthesiology Phone [phone] 05/13/2015 12:51 PM

## 2015-05-10 NOTE — Progress Notes (Signed)
[employer] rep called . Message sent. To return call 05/10/15 to Pray or angela  [phone]

## 2015-05-10 NOTE — Pre-Procedure Instructions (Signed)
STEPHANI JANAK  05/10/2015      [facility] - [place], [place] [address] Phone: [phone] Fax: [place], [place] [address] Phone: [phone] Fax: [phone]  [facility] - [place], [place] [address] AT [place] [place] [address] Phone: [phone] Fax: [phone]    Your procedure is scheduled on 05/17/15.  Report to [facility] short stay admitting at 530 A.M.  Call this number if you have problems the morning of surgery:  [phone]   Remember:  Do not eat food or drink liquids after midnight.  Take these medicines the morning of surgery with A SIP OF WATER.metoprolol,nitro if needed  STOP all herbel meds, nsaids (aleve,naproxen,advil,ibuprofen) 5 days prior to surgery starting 05/12/15 including brilinla, per dr,aspirin, vit D,calcium, diclofenac, magnesium oxide,multi vit,    How to Manage Your Diabetes Before Surgery   Why is it important to control my blood sugar before and after surgery?   Improving blood sugar levels before and after surgery helps healing and can limit problems.  A way of improving blood sugar control is eating a healthy diet by:  - Eating less sugar and carbohydrates  - Increasing activity/exercise  - Talk with your doctor about reaching your blood sugar goals  High blood sugars (greater than 180 mg/dL) can raise your risk of infections and slow down your recovery so you will need to focus on controlling your diabetes during the weeks before surgery.  Make sure that the doctor who takes care of your diabetes knows about your planned surgery including the date and location.  How do I manage my blood sugars before surgery?   Check your blood sugar at least 4 times a day, 2 days before surgery to make sure  that they are not too high or low.   Check your blood sugar the morning of your surgery when you wake up and every 2               hours until you get to the Short-Stay unit.  If your blood sugar is less than 70 mg/dL, you will need to treat for low blood sugar by:  Treat a low blood sugar (less than 70 mg/dL) with 1/2 cup of clear juice (cranberry or apple), 4 glucose tablets, OR glucose gel.  Recheck blood sugar in 15 minutes after treatment (to make sure it is greater than 70 mg/dL).  If blood sugar is not greater than 70 mg/dL on re-check, call [phone] for further instructions.   Report your blood sugar to the Short-Stay nurse when you get to Short-Stay.  References:  [facility], 2007 "How to Manage your Diabetes Before and After Surgery".   call endocrinologist about pump management  For patients with "Insulin Pumps":  Contact your diabetes doctor for specific instructions before surgery.   Decrease basal insulin rates by 20% at midnight the night before surgery.  Note that if your surgery is planned to be longer than 2 hours, your insulin pump will be removed and intravenous (IV) insulin will be started and managed by the nurses and anesthesiologist.  You will be able to restart your insulin pump once you are awake and able to manage it.  Make sure to bring insulin pump supplies  to the hospital with you in case your site needs to be changed.     Do not wear jewelry, make-up or nail polish.  Do not wear lotions, powders, or perfumes.  You may wear deodorant.  Do not shave 48 hours prior to surgery.  Men may shave face and neck.  Do not bring valuables to the hospital.  [facility] is not responsible for any belongings or valuables.  Contacts, dentures or bridgework may not be worn into surgery.  Leave your suitcase in the car.  After surgery it may be brought to your room.  For patients admitted to the hospital, discharge time will be  determined by your treatment team.  Patients discharged the day of surgery will not be allowed to drive home.   Name and phone number of your driver:    Special instructions:   Special Instructions: [facility] - Preparing for Surgery  Before surgery, you can play an important role.  Because skin is not sterile, your skin needs to be as free of germs as possible.  You can reduce the number of germs on you skin by washing with CHG (chlorahexidine gluconate) soap before surgery.  CHG is an antiseptic cleaner which kills germs and bonds with the skin to continue killing germs even after washing.  Please DO NOT use if you have an allergy to CHG or antibacterial soaps.  If your skin becomes reddened/irritated stop using the CHG and inform your nurse when you arrive at Short Stay.  Do not shave (including legs and underarms) for at least 48 hours prior to the first CHG shower.  You may shave your face.  Please follow these instructions carefully:   1.  Shower with CHG Soap the night before surgery and the morning of Surgery.  2.  If you choose to wash your hair, wash your hair first as usual with your normal shampoo.  3.  After you shampoo, rinse your hair and body thoroughly to remove the Shampoo.  4.  Use CHG as you would any other liquid soap.  You can apply chg directly  to the skin and wash gently with scrungie or a clean washcloth.  5.  Apply the CHG Soap to your body ONLY FROM THE NECK DOWN.  Do not use on open wounds or open sores.  Avoid contact with your eyes ears, mouth and genitals (private parts).  Wash genitals (private parts)       with your normal soap.  6.  Wash thoroughly, paying special attention to the area where your surgery will be performed.  7.  Thoroughly rinse your body with warm water from the neck down.  8.  DO NOT shower/wash with your normal soap after using and rinsing off the CHG Soap.  9.  Pat yourself dry with a clean towel.            10.  Wear clean pajamas.             11.  Place clean sheets on your bed the night of your first shower and do not sleep with pets.  Day of Surgery  Do not apply any lotions/deodorants the morning of surgery.  Please wear clean clothes to the hospital/surgery center.  Please read over the following fact sheets that you were given. Pain Booklet, Coughing and Deep Breathing and MRSA Information

## 2015-05-11 LAB — HEMOGLOBIN A1C
Hgb A1c MFr Bld: 6.9 % — ABNORMAL HIGH (ref 4.8–5.6)
Mean Plasma Glucose: 151 mg/dL

## 2015-05-16 ENCOUNTER — Ambulatory Visit: Payer: Medicare Other | Admitting: Cardiology

## 2015-05-16 ENCOUNTER — Telehealth: Payer: Self-pay | Admitting: Cardiology

## 2015-05-16 NOTE — Telephone Encounter (Signed)
Per Dr Cecil Cobbs unlikely cause of headaches.  Pt notified.

## 2015-05-16 NOTE — Telephone Encounter (Signed)
New message  Pt c/o medication issue:  1. Name of Medication: Brilanta  2. How are you currently taking this medication (dosage and times per day)? Stopped taking the medication because of the surgery on 05/17/2015 for a Mastectomy.   3. Are you having a reaction (difficulty breathing--STAT)? Intermitten Headaches that are getting worse 4. What is your medication issue? Requests a call back to determine if this is normal.

## 2015-05-16 NOTE — Telephone Encounter (Signed)
LMTCB

## 2015-05-16 NOTE — Telephone Encounter (Signed)
Follow up  Patient returned call ( Please use home phone)

## 2015-05-17 ENCOUNTER — Ambulatory Visit (HOSPITAL_COMMUNITY): Payer: Medicare Other | Admitting: Certified Registered Nurse Anesthetist

## 2015-05-17 ENCOUNTER — Ambulatory Visit (HOSPITAL_COMMUNITY): Payer: Medicare Other | Admitting: Vascular Surgery

## 2015-05-17 ENCOUNTER — Encounter (HOSPITAL_COMMUNITY): Payer: Self-pay | Admitting: Certified Registered Nurse Anesthetist

## 2015-05-17 ENCOUNTER — Encounter (HOSPITAL_COMMUNITY)
Admission: AD | Disposition: A | Discharge status: Home / Self Care | Payer: Self-pay | Source: Ambulatory Visit | Attending: General Surgery

## 2015-05-17 ENCOUNTER — Inpatient Hospital Stay (HOSPITAL_COMMUNITY)
Admission: AD | Admit: 2015-05-17 | Discharge: 2015-05-19 | DRG: 582 | Disposition: A | Discharge status: Home / Self Care | Payer: Medicare Other | Source: Ambulatory Visit | Attending: General Surgery | Admitting: General Surgery

## 2015-05-17 DIAGNOSIS — K7460 Unspecified cirrhosis of liver: Secondary | ICD-10-CM | POA: Diagnosis present

## 2015-05-17 DIAGNOSIS — I739 Peripheral vascular disease, unspecified: Secondary | ICD-10-CM | POA: Diagnosis present

## 2015-05-17 DIAGNOSIS — D0512 Intraductal carcinoma in situ of left breast: Secondary | ICD-10-CM | POA: Diagnosis present

## 2015-05-17 DIAGNOSIS — Z880 Allergy status to penicillin: Secondary | ICD-10-CM | POA: Diagnosis not present

## 2015-05-17 DIAGNOSIS — E7800 Pure hypercholesterolemia, unspecified: Secondary | ICD-10-CM | POA: Diagnosis present

## 2015-05-17 DIAGNOSIS — E1143 Type 2 diabetes mellitus with diabetic autonomic (poly)neuropathy: Secondary | ICD-10-CM | POA: Diagnosis present

## 2015-05-17 DIAGNOSIS — K3184 Gastroparesis: Secondary | ICD-10-CM | POA: Diagnosis present

## 2015-05-17 DIAGNOSIS — N189 Chronic kidney disease, unspecified: Secondary | ICD-10-CM | POA: Diagnosis present

## 2015-05-17 DIAGNOSIS — Z794 Long term (current) use of insulin: Secondary | ICD-10-CM

## 2015-05-17 DIAGNOSIS — Z86010 Personal history of colonic polyps: Secondary | ICD-10-CM

## 2015-05-17 DIAGNOSIS — Z951 Presence of aortocoronary bypass graft: Secondary | ICD-10-CM

## 2015-05-17 DIAGNOSIS — Z881 Allergy status to other antibiotic agents status: Secondary | ICD-10-CM

## 2015-05-17 DIAGNOSIS — I130 Hypertensive heart and chronic kidney disease with heart failure and stage 1 through stage 4 chronic kidney disease, or unspecified chronic kidney disease: Secondary | ICD-10-CM | POA: Diagnosis present

## 2015-05-17 DIAGNOSIS — I2510 Atherosclerotic heart disease of native coronary artery without angina pectoris: Secondary | ICD-10-CM | POA: Diagnosis present

## 2015-05-17 DIAGNOSIS — I509 Heart failure, unspecified: Secondary | ICD-10-CM | POA: Diagnosis present

## 2015-05-17 DIAGNOSIS — Z886 Allergy status to analgesic agent status: Secondary | ICD-10-CM | POA: Diagnosis not present

## 2015-05-17 DIAGNOSIS — Z923 Personal history of irradiation: Secondary | ICD-10-CM | POA: Diagnosis not present

## 2015-05-17 DIAGNOSIS — K219 Gastro-esophageal reflux disease without esophagitis: Secondary | ICD-10-CM | POA: Diagnosis present

## 2015-05-17 DIAGNOSIS — E1165 Type 2 diabetes mellitus with hyperglycemia: Secondary | ICD-10-CM | POA: Diagnosis present

## 2015-05-17 DIAGNOSIS — Z950 Presence of cardiac pacemaker: Secondary | ICD-10-CM

## 2015-05-17 DIAGNOSIS — Z955 Presence of coronary angioplasty implant and graft: Secondary | ICD-10-CM

## 2015-05-17 DIAGNOSIS — Z883 Allergy status to other anti-infective agents status: Secondary | ICD-10-CM | POA: Diagnosis not present

## 2015-05-17 DIAGNOSIS — Z885 Allergy status to narcotic agent status: Secondary | ICD-10-CM | POA: Diagnosis not present

## 2015-05-17 DIAGNOSIS — Z8571 Personal history of Hodgkin lymphoma: Secondary | ICD-10-CM

## 2015-05-17 DIAGNOSIS — Z9641 Presence of insulin pump (external) (internal): Secondary | ICD-10-CM | POA: Diagnosis present

## 2015-05-17 DIAGNOSIS — I4891 Unspecified atrial fibrillation: Secondary | ICD-10-CM | POA: Diagnosis present

## 2015-05-17 DIAGNOSIS — D0510 Intraductal carcinoma in situ of unspecified breast: Secondary | ICD-10-CM | POA: Diagnosis present

## 2015-05-17 DIAGNOSIS — Z888 Allergy status to other drugs, medicaments and biological substances status: Secondary | ICD-10-CM | POA: Diagnosis not present

## 2015-05-17 HISTORY — PX: MASTECTOMY: SHX3

## 2015-05-17 HISTORY — PX: TOTAL MASTECTOMY: SHX6129

## 2015-05-17 LAB — GLUCOSE, CAPILLARY
GLUCOSE-CAPILLARY: 130 mg/dL — AB (ref 65–99)
GLUCOSE-CAPILLARY: 168 mg/dL — AB (ref 65–99)
GLUCOSE-CAPILLARY: 150 mg/dL — AB (ref 65–99)
Glucose-Capillary: 114 mg/dL — ABNORMAL HIGH (ref 65–99)
Glucose-Capillary: 98 mg/dL (ref 65–99)

## 2015-05-17 SURGERY — MASTECTOMY, SIMPLE
Anesthesia: General | Site: Breast | Laterality: Left

## 2015-05-17 MED ORDER — PROPOFOL 10 MG/ML IV BOLUS
INTRAVENOUS | Status: AC
  Filled 2015-05-17: qty 20

## 2015-05-17 MED ORDER — BENEFIBER PO POWD: ORAL | Status: DC

## 2015-05-17 MED ORDER — FUROSEMIDE 80 MG PO TABS
80.0000 mg | ORAL_TABLET | Freq: Every day | ORAL | Status: DC
  Administered 2015-05-18 – 2015-05-19 (×2): 80 mg via ORAL
  Filled 2015-05-17 (×2): qty 1

## 2015-05-17 MED ORDER — METOPROLOL TARTRATE 50 MG PO TABS
75.0000 mg | ORAL_TABLET | Freq: Two times a day (BID) | ORAL | Status: DC
  Administered 2015-05-17 – 2015-05-19 (×3): 75 mg via ORAL
  Filled 2015-05-17 (×8): qty 1

## 2015-05-17 MED ORDER — PROMETHAZINE HCL 25 MG/ML IJ SOLN
6.2500 mg | INTRAMUSCULAR | Status: DC | PRN
Start: 2015-05-17 — End: 2015-05-17

## 2015-05-17 MED ORDER — INSULIN PUMP
Freq: Three times a day (TID) | SUBCUTANEOUS | Status: DC
  Administered 2015-05-17: 23:00:00 via SUBCUTANEOUS
  Administered 2015-05-17: 5 via SUBCUTANEOUS
  Administered 2015-05-17: 23.5 via SUBCUTANEOUS
  Administered 2015-05-17: 15.5 via SUBCUTANEOUS
  Administered 2015-05-18 (×2): via SUBCUTANEOUS
  Administered 2015-05-19: 17.65 via SUBCUTANEOUS
  Administered 2015-05-19: 03:00:00 via SUBCUTANEOUS
  Filled 2015-05-17: qty 1

## 2015-05-17 MED ORDER — HYDROMORPHONE HCL 1 MG/ML IJ SOLN
0.2500 mg | INTRAMUSCULAR | Status: DC | PRN
  Administered 2015-05-17 (×2): 0.5 mg via INTRAVENOUS

## 2015-05-17 MED ORDER — FENTANYL CITRATE (PF) 100 MCG/2ML IJ SOLN
INTRAMUSCULAR | Status: DC | PRN
  Administered 2015-05-17 (×2): 50 ug via INTRAVENOUS

## 2015-05-17 MED ORDER — FENTANYL CITRATE (PF) 250 MCG/5ML IJ SOLN
INTRAMUSCULAR | Status: AC
  Filled 2015-05-17: qty 5

## 2015-05-17 MED ORDER — ONDANSETRON HCL 4 MG/2ML IJ SOLN: 4.0000 mg | Freq: Four times a day (QID) | INTRAMUSCULAR | Status: DC | PRN

## 2015-05-17 MED ORDER — LIDOCAINE HCL (CARDIAC) 20 MG/ML IV SOLN
INTRAVENOUS | Status: AC
  Filled 2015-05-17: qty 5

## 2015-05-17 MED ORDER — ROCURONIUM BROMIDE 50 MG/5ML IV SOLN
INTRAVENOUS | Status: AC
  Filled 2015-05-17: qty 1

## 2015-05-17 MED ORDER — ASPIRIN EC 81 MG PO TBEC
81.0000 mg | DELAYED_RELEASE_TABLET | Freq: Every day | ORAL | Status: DC
  Administered 2015-05-17 – 2015-05-18 (×2): 81 mg via ORAL
  Filled 2015-05-17 (×2): qty 1

## 2015-05-17 MED ORDER — PANTOPRAZOLE SODIUM 40 MG PO TBEC
40.0000 mg | DELAYED_RELEASE_TABLET | Freq: Every day | ORAL | Status: DC
Start: 2015-05-17 — End: 2015-05-19
  Administered 2015-05-17 – 2015-05-19 (×3): 40 mg via ORAL
  Filled 2015-05-17 (×3): qty 1

## 2015-05-17 MED ORDER — NITROGLYCERIN 0.4 MG/SPRAY TL SOLN: 1.0000 | Status: DC | PRN

## 2015-05-17 MED ORDER — NITROGLYCERIN 0.4 MG SL SUBL: 0.4000 mg | SUBLINGUAL_TABLET | SUBLINGUAL | Status: DC | PRN

## 2015-05-17 MED ORDER — PHENYLEPHRINE 40 MCG/ML (10ML) SYRINGE FOR IV PUSH (FOR BLOOD PRESSURE SUPPORT)
PREFILLED_SYRINGE | INTRAVENOUS | Status: AC
  Filled 2015-05-17: qty 10

## 2015-05-17 MED ORDER — DOCUSATE SODIUM 100 MG PO CAPS
100.0000 mg | ORAL_CAPSULE | Freq: Every day | ORAL | Status: DC
  Administered 2015-05-18 – 2015-05-19 (×2): 100 mg via ORAL
  Filled 2015-05-17 (×2): qty 1

## 2015-05-17 MED ORDER — SUCCINYLCHOLINE CHLORIDE 20 MG/ML IJ SOLN
INTRAMUSCULAR | Status: DC | PRN
  Administered 2015-05-17: 100 mg via INTRAVENOUS

## 2015-05-17 MED ORDER — HYDROCODONE-ACETAMINOPHEN 5-325 MG PO TABS
1.0000 | ORAL_TABLET | ORAL | Status: DC | PRN
  Administered 2015-05-17: 1 via ORAL
  Administered 2015-05-17: 2 via ORAL
  Administered 2015-05-18 – 2015-05-19 (×4): 1 via ORAL
  Filled 2015-05-17 (×2): qty 2
  Filled 2015-05-17 (×3): qty 1
  Filled 2015-05-17: qty 2
  Filled 2015-05-17: qty 1

## 2015-05-17 MED ORDER — VITAMIN D 1000 UNITS PO TABS
1000.0000 [IU] | ORAL_TABLET | Freq: Every day | ORAL | Status: DC
  Administered 2015-05-18 – 2015-05-19 (×2): 1000 [IU] via ORAL
  Filled 2015-05-17 (×2): qty 1

## 2015-05-17 MED ORDER — ONDANSETRON 4 MG PO TBDP: 4.0000 mg | ORAL_TABLET | Freq: Four times a day (QID) | ORAL | Status: DC | PRN

## 2015-05-17 MED ORDER — ACETAMINOPHEN 325 MG PO TABS: 650.0000 mg | ORAL_TABLET | Freq: Four times a day (QID) | ORAL | Status: DC | PRN

## 2015-05-17 MED ORDER — LIDOCAINE HCL (CARDIAC) 20 MG/ML IV SOLN
INTRAVENOUS | Status: DC | PRN
  Administered 2015-05-17: 60 mg via INTRAVENOUS

## 2015-05-17 MED ORDER — LACTATED RINGERS IV SOLN
INTRAVENOUS | Status: DC | PRN
  Administered 2015-05-17 (×2): via INTRAVENOUS

## 2015-05-17 MED ORDER — SUCCINYLCHOLINE CHLORIDE 20 MG/ML IJ SOLN
INTRAMUSCULAR | Status: AC
  Filled 2015-05-17: qty 1

## 2015-05-17 MED ORDER — MIDAZOLAM HCL 5 MG/5ML IJ SOLN
INTRAMUSCULAR | Status: DC | PRN
  Administered 2015-05-17 (×2): 1 mg via INTRAVENOUS

## 2015-05-17 MED ORDER — PROPOFOL 10 MG/ML IV BOLUS
INTRAVENOUS | Status: DC | PRN
  Administered 2015-05-17: 140 mg via INTRAVENOUS

## 2015-05-17 MED ORDER — 0.9 % SODIUM CHLORIDE (POUR BTL) OPTIME
TOPICAL | Status: DC | PRN
  Administered 2015-05-17: 1000 mL

## 2015-05-17 MED ORDER — SCOPOLAMINE 1 MG/3DAYS TD PT72
MEDICATED_PATCH | TRANSDERMAL | Status: AC
  Administered 2015-05-17: 1 via TRANSDERMAL
  Filled 2015-05-17: qty 1

## 2015-05-17 MED ORDER — POTASSIUM CHLORIDE CRYS ER 20 MEQ PO TBCR
40.0000 meq | EXTENDED_RELEASE_TABLET | Freq: Every morning | ORAL | Status: DC
  Administered 2015-05-18 – 2015-05-19 (×2): 40 meq via ORAL
  Filled 2015-05-17 (×3): qty 2

## 2015-05-17 MED ORDER — BUPIVACAINE-EPINEPHRINE (PF) 0.5% -1:200000 IJ SOLN
INTRAMUSCULAR | Status: DC | PRN
  Administered 2015-05-17: 30 mL

## 2015-05-17 MED ORDER — HYDROMORPHONE HCL 1 MG/ML IJ SOLN
INTRAMUSCULAR | Status: AC
  Filled 2015-05-17: qty 1

## 2015-05-17 MED ORDER — ADULT MULTIVITAMIN W/MINERALS CH
1.0000 | ORAL_TABLET | Freq: Every day | ORAL | Status: DC
  Administered 2015-05-18 – 2015-05-19 (×2): 1 via ORAL
  Filled 2015-05-17 (×2): qty 1

## 2015-05-17 MED ORDER — ONDANSETRON HCL 4 MG/2ML IJ SOLN
INTRAMUSCULAR | Status: AC
  Filled 2015-05-17: qty 2

## 2015-05-17 MED ORDER — MIDAZOLAM HCL 2 MG/2ML IJ SOLN
INTRAMUSCULAR | Status: AC
  Filled 2015-05-17: qty 4

## 2015-05-17 MED ORDER — ISOSORBIDE MONONITRATE ER 60 MG PO TB24
60.0000 mg | ORAL_TABLET | Freq: Every day | ORAL | Status: DC
  Administered 2015-05-17 – 2015-05-18 (×2): 60 mg via ORAL
  Filled 2015-05-17 (×2): qty 1

## 2015-05-17 MED ORDER — DEXAMETHASONE SODIUM PHOSPHATE 4 MG/ML IJ SOLN
INTRAMUSCULAR | Status: DC | PRN
  Administered 2015-05-17: 4 mg via INTRAVENOUS

## 2015-05-17 MED ORDER — ALPRAZOLAM 0.5 MG PO TABS
0.5000 mg | ORAL_TABLET | Freq: Every day | ORAL | Status: DC
  Administered 2015-05-17 – 2015-05-18 (×2): 0.5 mg via ORAL
  Filled 2015-05-17 (×2): qty 1

## 2015-05-17 MED ORDER — SODIUM CHLORIDE 0.9 % IV SOLN
INTRAVENOUS | Status: DC
  Administered 2015-05-17: 10:00:00 via INTRAVENOUS

## 2015-05-17 MED ORDER — DEXAMETHASONE SODIUM PHOSPHATE 4 MG/ML IJ SOLN
INTRAMUSCULAR | Status: AC
  Filled 2015-05-17: qty 1

## 2015-05-17 MED ORDER — ONDANSETRON HCL 4 MG/2ML IJ SOLN
INTRAMUSCULAR | Status: DC | PRN
  Administered 2015-05-17: 4 mg via INTRAVENOUS

## 2015-05-17 SURGICAL SUPPLY — 34 items
BINDER BREAST LRG (GAUZE/BANDAGES/DRESSINGS) IMPLANT
BINDER BREAST MEDIUM (GAUZE/BANDAGES/DRESSINGS) ×2 IMPLANT
BINDER BREAST XLRG (GAUZE/BANDAGES/DRESSINGS) IMPLANT
BIOPATCH RED 1 DISK 7.0 (GAUZE/BANDAGES/DRESSINGS) ×2 IMPLANT
CHLORAPREP W/TINT 26ML (MISCELLANEOUS) ×2 IMPLANT
CLSR STERI-STRIP ANTIMIC 1/2X4 (GAUZE/BANDAGES/DRESSINGS) ×2 IMPLANT
COVER SURGICAL LIGHT HANDLE (MISCELLANEOUS) ×2 IMPLANT
DERMABOND ADVANCED (GAUZE/BANDAGES/DRESSINGS) ×1
DERMABOND ADVANCED .7 DNX12 (GAUZE/BANDAGES/DRESSINGS) ×1 IMPLANT
DRAIN CHANNEL 19F RND (DRAIN) ×2 IMPLANT
DRAPE LAPAROSCOPIC ABDOMINAL (DRAPES) ×2 IMPLANT
DRAPE UTILITY XL STRL (DRAPES) ×4 IMPLANT
DRSG TEGADERM 4X4.75 (GAUZE/BANDAGES/DRESSINGS) ×2 IMPLANT
ELECT REM PT RETURN 9FT ADLT (ELECTROSURGICAL) ×2
ELECTRODE REM PT RTRN 9FT ADLT (ELECTROSURGICAL) ×1 IMPLANT
EVACUATOR SILICONE 100CC (DRAIN) ×2 IMPLANT
GAUZE SPONGE 4X4 12PLY STRL (GAUZE/BANDAGES/DRESSINGS) ×2 IMPLANT
GLOVE BIO SURGEON STRL SZ7 (GLOVE) ×4 IMPLANT
GLOVE BIOGEL M 6.5 STRL (GLOVE) ×2 IMPLANT
GLOVE BIOGEL PI IND STRL 7.5 (GLOVE) ×1 IMPLANT
GLOVE BIOGEL PI INDICATOR 7.5 (GLOVE) ×1
GOWN STRL REUS W/ TWL LRG LVL3 (GOWN DISPOSABLE) ×3 IMPLANT
GOWN STRL REUS W/TWL LRG LVL3 (GOWN DISPOSABLE) ×3
KIT BASIN OR (CUSTOM PROCEDURE TRAY) ×2 IMPLANT
KIT ROOM TURNOVER OR (KITS) ×2 IMPLANT
LIQUID BAND (GAUZE/BANDAGES/DRESSINGS) ×2 IMPLANT
NS IRRIG 1000ML POUR BTL (IV SOLUTION) ×2 IMPLANT
PACK GENERAL/GYN (CUSTOM PROCEDURE TRAY) ×2 IMPLANT
PAD ARMBOARD 7.5X6 YLW CONV (MISCELLANEOUS) ×2 IMPLANT
SUT ETHILON 2 0 FS 18 (SUTURE) ×2 IMPLANT
SUT MON AB 4-0 PC3 18 (SUTURE) ×4 IMPLANT
SUT VIC AB 3-0 SH 18 (SUTURE) ×4 IMPLANT
TOWEL OR 17X24 6PK STRL BLUE (TOWEL DISPOSABLE) ×2 IMPLANT
TOWEL OR 17X26 10 PK STRL BLUE (TOWEL DISPOSABLE) ×2 IMPLANT

## 2015-05-17 NOTE — Discharge Instructions (Signed)
[facility], [place] [phone]  MASTECTOMY: POST OP INSTRUCTIONS  Always review your discharge instruction sheet given to you by the facility where your surgery was performed. IF YOU HAVE DISABILITY OR FAMILY LEAVE FORMS, YOU MUST BRING THEM TO THE OFFICE FOR PROCESSING.   DO NOT GIVE THEM TO YOUR DOCTOR. A prescription for pain medication may be given to you upon discharge.  Take your pain medication as prescribed, if needed.  If narcotic pain medicine is not needed, then you may take acetaminophen (Tylenol), naprosyn (Alleve) or ibuprofen (Advil) as needed. 1. Take your usually prescribed medications unless otherwise directed. 2. If you need a refill on your pain medication, please contact your pharmacy.  They will contact our office to request authorization.  Prescriptions will not be filled after 5pm or on week-ends. 3. You should follow a light diet the first few days after arrival home, such as soup and crackers, etc.  Resume your normal diet the day after surgery. 4. Most patients will experience some swelling and bruising on the chest and underarm.  Ice packs will help.  Swelling and bruising can take several days to resolve. Wear the binder day and night until you return to the office.  5. It is common to experience some constipation if taking pain medication after surgery.  Increasing fluid intake and taking a stool softener (such as Colace) will usually help or prevent this problem from occurring.  A mild laxative (Milk of Magnesia or Miralax) should be taken according to package instructions if there are no bowel movements after 48 hours. 6. Unless discharge instructions indicate otherwise, leave your bandage dry and in place until your next appointment in 3-5 days.  You may take a limited sponge bath.  No tube baths or showers until the drains are removed.  You may have steri-strips (small skin tapes) in place directly over the incision.  These strips should be left on the  skin for 7-10 days. If you have glue it will come off in next couple week.  Any sutures will be removed at an office visit 7. DRAINS:  If you have drains in place, it is important to keep a list of the amount of drainage produced each day in your drains.  Before leaving the hospital, you should be instructed on drain care.  Call our office if you have any questions about your drains. I will remove your drains when they put out less than 30 cc or ml for 2 consecutive days. 8. ACTIVITIES:  You may resume regular (light) daily activities beginning the next day--such as daily self-care, walking, climbing stairs--gradually increasing activities as tolerated.  You may have sexual intercourse when it is comfortable.  Refrain from any heavy lifting or straining until approved by your doctor. a. You may drive when you are no longer taking prescription pain medication, you can comfortably wear a seatbelt, and you can safely maneuver your car and apply brakes. b. RETURN TO WORK:  __________________________________________________________ 9. You should see your doctor in the office for a follow-up appointment approximately 3-5 days after your surgery.  Your doctors nurse will typically make your follow-up appointment when she calls you with your pathology report.  Expect your pathology report 3-4business days after surgery. 10. OTHER INSTRUCTIONS: ______________________________________________________________________________________________ ____________________________________________________________________________________________ WHEN TO CALL YOUR DR Wen Munford: 1. Fever over 101.0 2. Nausea and/or vomiting 3. Extreme swelling or bruising 4. Continued bleeding from incision. 5. Increased pain, redness, or drainage from the incision. The clinic staff is available  to answer your questions during regular business hours.  Please dont hesitate to call and ask to speak to one of the nurses for clinical concerns.  If  you have a medical emergency, go to the nearest emergency room or call 911.  A surgeon from [facility] is always on call at the hospital. [address], [place], [place], [place]  [address] ? [address], [place], [place]   [address] [phone] ? [phone] ? FAX [phone] Web site: [email or web address]

## 2015-05-17 NOTE — Transfer of Care (Signed)
Immediate Anesthesia Transfer of Care Note  Patient: [name] [name]  Procedure(s) Performed: Procedure(s): LEFT TOTAL MASTECTOMY (Left)  Patient Location: PACU  Anesthesia Type:General and Regional  Level of Consciousness: awake and sedated  Airway & Oxygen Therapy: Patient Spontanous Breathing and Patient connected to nasal cannula oxygen  Post-op Assessment: Report given to RN and Post -op Vital signs reviewed and stable  Post vital signs: Reviewed and stable  Last Vitals:  Filed Vitals:   05/17/15 0554  BP: 149/56  Pulse: 66  Temp: 35.3 C    Complications: No apparent anesthesia complications

## 2015-05-17 NOTE — Anesthesia Procedure Notes (Addendum)
Anesthesia Regional Block:  Pectoralis block  Pre-Anesthetic Checklist: ,, timeout performed, Correct Patient, Correct Site, Correct Laterality, Correct Procedure, Correct Position, site marked, Risks and benefits discussed,  Surgical consent,  Pre-op evaluation,  At surgeon's request and post-op pain management  Laterality: Left  Prep: chloraprep       Needles:   Needle Type: Echogenic Stimulator Needle     Needle Length: 9cm 9 cm Needle Gauge: 22 and 22 G  Needle insertion depth: 4 cm   Additional Needles:  Procedures: ultrasound guided (picture in chart) Pectoralis block Narrative:  Start time: 05/17/2015 7:00 AM End time: 05/17/2015 7:16 AM Injection made incrementally with aspirations every 5 mL.  Performed by: Personally  Anesthesiologist: MASSAGEE, TERRY  Additional Notes: Tolerated well   Procedure Name: Intubation Date/Time: 05/17/2015 7:45 AM Performed by: Garrison Columbus T Pre-anesthesia Checklist: Patient identified, Emergency Drugs available, Suction available and Patient being monitored Patient Re-evaluated:Patient Re-evaluated prior to inductionOxygen Delivery Method: Circle system utilized Preoxygenation: Pre-oxygenation with 100% oxygen Intubation Type: IV induction Ventilation: Mask ventilation without difficulty Laryngoscope Size: Miller and 2 Grade View: Grade II Tube type: Oral Tube size: 7.5 mm Number of attempts: 1 Airway Equipment and Method: Stylet Placement Confirmation: ETT inserted through vocal cords under direct vision,  positive ETCO2 and breath sounds checked- equal and bilateral Secured at: 21 cm Tube secured with: Tape Dental Injury: Teeth and Oropharynx as per pre-operative assessment

## 2015-05-17 NOTE — Progress Notes (Signed)
Inpatient Diabetes Program Recommendations  AACE/ADA: New Consensus Statement on Inpatient Glycemic Control (2015)  Target Ranges:  Prepandial:   less than 140 mg/dL      Peak postprandial:   less than 180 mg/dL (1-2 hours)      Critically ill patients:  140 - 180 mg/dL    Results for [name] [name], [name] [name] (MRN [id number]) as of 05/17/2015 11:20  Ref. Range 05/17/2015 05:59 05/17/2015 09:05 05/17/2015 10:47  Glucose-Capillary Latest Ref Range: 65-99 mg/dL 130 (H) 114 (H) 168 (H)    Admit for Mastectomy  History: DM  Home DM Meds: Insulin Pump (see below for settings)  Current Insulin Orders: Insulin Pump    -Spoke with patient and her husband and her daughter.  Patient still groggy from surgery but able to wake up and answer questions.  Is Oriented X4 at present.  -Patient's daughter was able to assist me to find patient's insulin pump settings.  Patient's daughter skilled at operating insulin pump.  Husband also able to assist patient with her insulin pump while she is still drowsy.  Daughter of patient assisted patient in giving patient 5 unit Apidra insulin bolus at 11:15am today for CBG of 168 mg/dl.  Per patient, patient stated she has a lot of insulin resistance and requires high doses of insulin to keep her CBGs under control.  Patient's daughter and husband to assist patient with her insulin pump until she is more awake post-op.  -Educated patient and family about insulin pump protocol in the hospital.  Told patient we will be checking her CBGs with the hospital meter tid ac + HS and at 2am.  Encouraged patient and family to ask for more frequent CBG checks if needed.  Also requested that patient and her family let the RN know about all insulin boluses given by patient through pump so they can be charted.  Family appreciative and agreeable to plan.   ----Insulin Pump settings are as follows----  Basal Rates: 12AM- 1.05 units/hr 5AM- 2.3 units/hr 7AM- 2.3 units/hr 8:30AM- 2.6  units/hr 6PM- 3.4 units/hr  Total Basal per 24 hour period= 58.4 units  Carbohydrate Ratio= 1 unit Apidra insulin for every 3 grams carbohydrates eaten  Correction/Sensitivity Factor= 1 unit Apidra for every 7 mg/dl above target CBG  Target CBG= 100 mg/dl    -Reviewed documentation responsibilities with RN caring for patient.   --Will follow patient during hospitalization--  Wyn Quaker RN, MSN, CDE Diabetes Coordinator Inpatient Glycemic Control Team Team Pager: [phone] (8a-5p)

## 2015-05-17 NOTE — H&P (Signed)
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 she has ned. she has cardiac side effects leading to multiple stents, single vessel cab in 2000, tissue aov, and a pacemaker. she is on blood thinners. she has no anginal symptoms. she underwent routine screening mm (had no mass or discharge) that shows density c. she has heterogenous calcs left breast at 3 oclock. 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   Other Problems Rolm Bookbinder, MD; 04/25/2015 3:28 PM) Hemorrhoids High blood pressure Hypercholesterolemia Gastroesophageal Reflux Disease General anesthesia - complications Heart murmur Migraine Headache Transfusion history Vascular Disease Ventral Hernia Repair Oophorectomy Bilateral. Other disease, cancer, significant illness Thyroid Disease Breast Cancer Cancer Chest pain Anxiety Disorder Arthritis Atrial Fibrillation Cholelithiasis Diabetes Mellitus Diverticulosis Gastric Ulcer Chronic Renal Failure Syndrome Cirrhosis Of Liver Congestive Heart Failure  Past Surgical History Rolm Bookbinder, MD; 04/25/2015 3:28 PM) Nephrectomy Right. Resection of Small Bowel Valve Replacement Hysterectomy (not due to cancer) - Complete Breast Biopsy Left. Colon Polyp Removal - Colonoscopy Coronary Artery Bypass Graft  Allergies Rolm Bookbinder, MD; 04/25/2015 3:28 PM) MetFORMIN HCl *ANTIDIABETICS* Plavix *HEMATOLOGICAL AGENTS - MISC.* Codeine Sulfate *ANALGESICS - OPIOID* Avandia *ANTIDIABETICS* Cholestyramine *ANTIHYPERLIPIDEMICS* Pravastatin Sodium *ANTIHYPERLIPIDEMICS* Neosporin Plus *DERMATOLOGICALS* TraMADol HCl *ANALGESICS - OPIOID* severe vomiting Diagnostic *DIAGNOSTIC PRODUCTS* Simvastatin *ANTIHYPERLIPIDEMICS* Tamiflu *ANTIVIRALS* Atorvastatin Calcium  *ANTIHYPERLIPIDEMICS* HumaLOG *ANTIDIABETICS* NovoLOG *ANTIDIABETICS* Erythromycin *MACROLIDES* Cephalexin *CEPHALOSPORINS* Clindamycin HCl *Anti-infective Agents - Misc.** Anaphylaxis. Gentamicin Sulfate *DERMATOLOGICALS* Limonene *CHEMICALS* Crestor *ANTIHYPERLIPIDEMICS* Penicillin V *PENICILLINS* Tizanidine Comfort Pac *MUSCULOSKELETAL THERAPY AGENTS* Bacitracin *Anti-infective Agents - Misc.**03/30/2015 Pt called to state she is NOT allergic to this medication.SAL  Medication History Rolm Bookbinder, MD; 04/25/2015 3:28 PM) Medications Reconciled ALPRAZolam (0.5MG Tablet, Oral) Active. TraMADol HCl (50MG Tablet, Oral) Active. Apidra (100UNIT/ML Solution, Injection) Active. Brilinta (90MG Tablet, Oral) Active. Furosemide (80MG Tablet, Oral) Active. GlucaGen HypoKit (1MG For Solution, Injection) Active. Isosorbide Mononitrate ER (60MG Tablet ER 24HR, Oral) Active. Klor-Con M20 [facility] Tablet ER, Oral) Active. Zovirax (5% Cream, External) Active. ValACYclovir HCl (1GM Tablet, Oral) Active. Praluent (75MG/ML Soln Pen-inj, Subcutaneous) Active. Omeprazole (20MG Capsule DR, Oral) Active. Metoprolol Tartrate (50MG Tablet, Oral) Active. Nitroglycerin (0.4MG/SPRAY Solution, Translingual) Active.  Social History Rolm Bookbinder, MD; 04/25/2015 3:28 PM) Non-Contributory Social History No drug use Tobacco use Never smoker. Alcohol use Remotely quit alcohol use. No caffeine use  Vitals (Alisha Spillers CMA; 04/25/2015 11:58 AM) 04/25/2015 11:58 AM Weight: 123 lb Height: 63in Body Surface Area: 1.57 m Body Mass Index: 21.79 kg/m  Pulse: 72 (Regular)  BP: 102/60 (Sitting, Left Arm, Standard)     Physical Exam Rolm Bookbinder MD; 04/25/2015 3:28 PM) ls Alyse Low Moore CMA; 03/29/2015 11:11 AM) 03/29/2015 11:10 AM Weight: 123.6 lb Temp.: 97.60F(Temporal)  Pulse: 76 (Regular)  Resp.: 16 (Unlabored)  BP: 100/68 (Sitting, Left  Arm, Standard)   Physical Exam  General Mental Status-Alert. Orientation-Oriented X3. Chest and Lung Exam Chest and lung exam reveals -on auscultation, normal breath sounds, no adventitious sounds and normal vocal resonance. Breast Nipples-No Discharge. Breast Lump-No Palpable Breast Mass. Note: large inferior pole hematoma left breast Cardiovascular Note: audible click aov, nl [id number], well healed sternotomy scar Lymphatic Head & Neck General Head & Neck Lymphatics: Bilateral - Description - Normal. Axillary General Axillary Region: Bilateral - Description - Normal. Note: no [place] adenopathy   Assessment & Plan Rolm Bookbinder MD; 04/25/2015 3:29 PM) DCIS (DUCTAL CARCINOMA IN SITU), LEFT (D05.12) Story: Left mastectomy After discussion with  Drs Aundra Dubin and Marko Plume about best therapy understanding for her this may not be ideal for disease process. i think left simple mastectomy at end of october to get her closer to the one year on dual antiplatelet therapy for her des would be best. I think omission of node biopsy reasonable with her. this is agreeable to both of her other physicians. will hold brilinta five days and use magnet on pacer as it does not activate much according to [name].

## 2015-05-17 NOTE — Progress Notes (Signed)
Spoke with Dannial Monarch, RN at [facility] regarding pacemaker interegation,will be here as soon as possible

## 2015-05-17 NOTE — Op Note (Signed)
Preoperative diagnosis: Left breast ductal carcinoma in situ Postoperative diagnosis: Same as above Procedure: Left total mastectomy Surgeon: Dr. Serita Grammes Anesthesia: Gen. With a pectoral block Estimated blood loss: Minimal Complications: None Drains: 19 [place] Blake drain Specimens: Left mastectomy marked short stitch superior, long stitch lateral Sponge count was correct at completion Disposition to recovery stable  Indications: This is a 64 year old female with multiple medical problems. She presents with newly diagnosed left breast ductal carcinoma in situ. We have elected after discussion with multiple physicians to proceed with a left total mastectomy for her DCIS.  Procedure: After informed consent was obtained the patient first underwent a pectoral block. She was then taken to the operating room. She had sequential compression devices in place. She was in place under general anesthesia without complication. Her left breast was prepped and draped in the standard sterile surgical fashion. Her insulin pump was protected. A surgical timeout was then performed.  I sutured a magnet in place overlying her pacemaker. I then made an elliptical incision around the nipple areolar complex and made flaps to the superior border of the breast, latissimus, inframammary crease, and the sternum. I then removed the breast as well as a pectoralis fascia from the muscle. This was then passed off the table and marked as above. Hemostasis was meticulously obtained. There was no bleeding upon completion. I then placed a 49 [place] Blake drain and secured this with a 2-0 nylon suture. I then closed this with 3-0 Vicryl and 4-0 Monocryl. Dermabond and Steri-Strips were placed. She tolerated this well. A breast binder was placed. She was transferred to recovery in stable condition.

## 2015-05-17 NOTE — Anesthesia Postprocedure Evaluation (Signed)
  Anesthesia Post-op Note  Patient: [name] [name]  Procedure(s) Performed: Procedure(s): LEFT TOTAL MASTECTOMY (Left)  Patient Location: PACU  Anesthesia Type:GA combined with regional for post-op pain  Level of Consciousness: awake and alert   Airway and Oxygen Therapy: Patient Spontanous Breathing  Post-op Pain: none  Post-op Assessment: Post-op Vital signs reviewed and Patient's Cardiovascular Status Stable              Post-op Vital Signs: stable  Last Vitals:  Filed Vitals:   05/17/15 0959  BP: 126/58  Pulse: 66  Temp: 36.7 C  Resp: 16    Complications: No apparent anesthesia complications

## 2015-05-18 ENCOUNTER — Encounter (HOSPITAL_COMMUNITY): Payer: Self-pay | Admitting: General Surgery

## 2015-05-18 LAB — GLUCOSE, CAPILLARY
GLUCOSE-CAPILLARY: 72 mg/dL (ref 65–99)
GLUCOSE-CAPILLARY: 96 mg/dL (ref 65–99)
GLUCOSE-CAPILLARY: 99 mg/dL (ref 65–99)
GLUCOSE-CAPILLARY: 70 mg/dL (ref 65–99)
Glucose-Capillary: 94 mg/dL (ref 65–99)
Glucose-Capillary: 120 mg/dL — ABNORMAL HIGH (ref 65–99)

## 2015-05-18 LAB — BASIC METABOLIC PANEL
ANION GAP: 13 (ref 5–15)
BUN: 26 mg/dL — AB (ref 6–20)
CHLORIDE: 104 mmol/L (ref 101–111)
CO2: 24 mmol/L (ref 22–32)
Calcium: 9.3 mg/dL (ref 8.9–10.3)
Creatinine, Ser: 0.94 mg/dL (ref 0.44–1.00)
Glucose, Bld: 96 mg/dL (ref 65–99)
POTASSIUM: 4.2 mmol/L (ref 3.5–5.1)
SODIUM: 141 mmol/L (ref 135–145)

## 2015-05-18 MED ORDER — PSYLLIUM 95 % PO PACK
1.0000 | PACK | Freq: Every day | ORAL | Status: DC
  Administered 2015-05-18: 1 via ORAL
  Filled 2015-05-18 (×2): qty 1

## 2015-05-18 NOTE — Progress Notes (Signed)
Medical Oncology  Appreciate notification of surgery done 05-17-15.  Patient seen, with daughter here now. Everything appears to be going well, with minimal local discomfort and no significant bleeding, no chest pain, insulin pump off since a little low this AM but expects to resume this with breakfast.  Vitals noted. Alert,, fully oriented and easily conversant, sitting up in chair on RA. Peripheral IV right hand site ok. Lungs without wheezes or rales. Oral mucosa clear. Heart RRR. Abdomen BS present, not tender. LE no edema, cords, feet warm.  Labs noted. If any increased bleeding would check CBC as platelets run slightly low.  Path pending.  Patient and daughter are most appreciative of care by [name] and anesthesiology.  Please page me if I can be of help prior to DC  [phone]. She is to see me on Nov 14. Thank you L.Marko Plume, MD

## 2015-05-18 NOTE — Progress Notes (Addendum)
1 Day Post-Op  Subjective: Doing well, some drainage around jp this am when pulled. No n/v today, pain controlled, voiding  Objective: Vital signs in last 24 hours: Temp:  [96.8 F (36 C)-98.4 F (36.9 C)] 97.7 F (36.5 C) (10/26 0501) Pulse Rate:  [62-82] 73 (10/26 0501) Resp:  [15-18] 16 (10/26 0501) BP: (102-145)/(44-71) 113/47 mmHg (10/26 0501) SpO2:  [80 %-100 %] 93 % (10/26 0501)    Intake/Output from previous day: 10/25 0701 - 10/26 0700 In: 2459.2 [P.O.:360; I.V.:1999.2] Out: 100 [Drains:70; Blood:30] Intake/Output this shift:    Incision/Wound:incision clean, no hematoma, drain thin serosang   Lab Results:  No results for input(s): WBC, HGB, HCT, PLT in the last 72 hours. BMET  Recent Labs  05/18/15 0420  NA 141  K 4.2  CL 104  CO2 24  GLUCOSE 96  BUN 26*  CREATININE 0.94  CALCIUM 9.3     Assessment/Plan: POD 1 left total mastectomy  1. Continue po pain meds as needed 2. Restart home meds, will wait on brilinta until later today or tomorrow, continue aspirin 3. Regular diet 4. Insulin pump, glucose under good control 5. Will plan dc in am if doing well   [facility] 05/18/2015

## 2015-05-18 NOTE — Clinical Documentation Improvement (Signed)
General Surgery  Can the diagnosis of "Chronic Renal Failure Syndrome" be further specified? Please update your documentation within the medical record to reflect your response to this query. Thank you!   CKD Stage I - GFR greater than or equal to 90  CKD Stage II - GFR 60-89  CKD Stage III - GFR 30-59  CKD Stage IV - GFR 15-29  CKD Stage V - GFR < 15  ESRD (End Stage Renal Disease)  Other condition  Unable to clinically determine  Supporting Information: : (risk factors, signs and symptoms, diagnostics, treatment)  White female  GFR's running from 35 (pre-surgical testing) to > 60  Please match her results to the above appropriate stage  Please exercise your independent, professional judgment when responding. A specific answer is not anticipated or expected.   Thank You, [place] [phone]

## 2015-05-18 NOTE — Clinical Documentation Improvement (Signed)
General Surgery  Can the diagnosis of CHF be further specified noted in your H&P? Please update your documentation within the medical record to reflect your response to this query. Thank you!   Acuity - Chronic  Type - Systolic, Diastolic, Combined  Other  Clinically Undetermined  Document any associated diagnoses/conditions  Supporting Information:  ECHO 11/15 revealed EF of 55-60% with normal systolic function  Being treated with Lasix 80 mg PO Daily and Lopressor 75 mg PO twice Daily  Dr. Claris Gladden name has been mentioned in chart appearing to be a Consultant  Please exercise your independent, professional judgment when responding. A specific answer is not anticipated or expected.  Thank You,  Zoila Shutter RN, [place] [phone]

## 2015-05-19 ENCOUNTER — Telehealth (HOSPITAL_COMMUNITY): Payer: Self-pay

## 2015-05-19 LAB — GLUCOSE, CAPILLARY
Glucose-Capillary: 92 mg/dL (ref 65–99)
Glucose-Capillary: 69 mg/dL (ref 65–99)
Glucose-Capillary: 79 mg/dL (ref 65–99)

## 2015-05-19 MED ORDER — TICAGRELOR 90 MG PO TABS
90.0000 mg | ORAL_TABLET | Freq: Two times a day (BID) | ORAL | Status: DC
  Administered 2015-05-19: 90 mg via ORAL
  Filled 2015-05-19: qty 1

## 2015-05-19 MED ORDER — HYDROCODONE-ACETAMINOPHEN 5-325 MG PO TABS: 1.0000 | ORAL_TABLET | Freq: Four times a day (QID) | ORAL | Status: DC | PRN

## 2015-05-19 NOTE — Care Management Note (Signed)
Case Management Note  Patient Details  Name: [name] [name] MRN: [id number] Date of Birth: 11/11/1950  Subjective/Objective:                    Action/Plan:   Expected Discharge Date:                  Expected Discharge Plan:  Home/Self Care  In-House Referral:     Discharge planning Services     Post Acute Care Choice:    Choice offered to:     DME Arranged:    DME Agency:     HH Arranged:    [place] Agency:     Status of Service:  Completed, signed off  Medicare Important Message Given:    Date Medicare IM Given:    Medicare IM give by:    Date Additional Medicare IM Given:    Additional Medicare Important Message give by:     If discussed at [place] of Stay Meetings, dates discussed:    Additional Comments:  Marilu Favre, RN 05/19/2015, 10:00 AM

## 2015-05-19 NOTE — Discharge Summary (Signed)
Physician Discharge Summary  Patient ID: [name] [name] MRN: [id number] DOB/AGE: 1950/08/27 64 y.o.  Admit date: 05/17/2015 Discharge date: 05/19/2015  Admission Diagnoses: dcis chf CAD IDDM  Discharge Diagnoses:  Active Problems:   DCIS (ductal carcinoma in situ) of breast   Discharged Condition: good  Hospital Course: 63 yof with multiple medical issues from radiation for prior lymphoma who was admitted and underwent left total mastectomy for dcis. She has remained stable postoperatively with nl drain output and no hematoma.  She is tolerating diet, ambulating, pain controlled.  I will give dose of brilinta this am and restart.  Dc home today  Consults: None  Significant Diagnostic Studies: none  Treatments: surgery: left total mastectomy  Discharge Exam: Blood pressure 106/42, pulse 72, temperature 98.1 F (36.7 C), temperature source Oral, resp. rate 17, height 5' 3"  (1.6 m), weight 56.7 kg (125 lb), SpO2 93 %. Incision/Wound:clean without infection, drain with expected minimal output, no hematoma.  Disposition: 01-Home or Self Care     Medication List    TAKE these medications        Alirocumab 75 MG/ML Sopn  Inject 75 mg into the skin as directed. Every 2 weeks on Tuesday.  Will be taking Monday 10/24 prior to admission for surgery     ALPRAZolam 0.5 MG tablet  Commonly known as:  XANAX  Take 0.5 mg by mouth at bedtime. May take additional dose daily for anxiety     APIDRA 100 UNIT/ML injection  Generic drug:  insulin glulisine  INFUSE THROUGH INSULIN PUMP UTD. Has been receiving 82-125 units daily over last 10 days.  Asked pt to bring with her on admission 10/25     aspirin EC 81 MG tablet  Take 81 mg by mouth at bedtime.     azithromycin 500 MG tablet  Commonly known as:  ZITHROMAX  1 tablet by mouth one hour prior to dental appointment/procedure     BENEFIBER PO  Take 45 mLs by mouth every morning. 3 tablespoons each morning     Cholecalciferol 2000 UNITS Tabs  Take 1 tablet by mouth daily.     clobetasol 0.05 % external solution  Commonly known as:  TEMOVATE  Apply 1 application topically daily as needed (for hives).     desoximetasone 0.05 % cream  Commonly known as:  TOPICORT  Apply 1 application topically 2 (two) times daily as needed. When skin starts peeling     docusate sodium 100 MG capsule  Commonly known as:  COLACE  Take 1 capsule (100 mg total) by mouth 2 (two) times daily.     ferrous fumarate 325 (106 FE) MG Tabs tablet  Commonly known as:  HEMOCYTE - 106 mg FE  Take 1 tablet (106 mg of iron total) by mouth 2 (two) times daily. Or as directed. Take with Vitamin -C     furosemide 40 MG tablet  Commonly known as:  LASIX  Take 40 mg by mouth daily as needed for fluid (if weight increases >= 2 lb).     furosemide 80 MG tablet  Commonly known as:  LASIX  Take 1 tablet (80 mg total) by mouth daily.     GLUCAGON EMERGENCY 1 MG injection  Generic drug:  glucagon  Inject 1 mg into the vein once as needed (for severe reaction).     HYDROcodone-acetaminophen 5-325 MG tablet  Commonly known as:  NORCO/VICODIN  Take 1-2 tablets by mouth every 6 (six) hours as needed for moderate pain or  severe pain.     hydrocortisone 25 MG suppository  Commonly known as:  ANUSOL-HC  Place 25 mg rectally 2 (two) times daily.     HYDROCORTISONE ACE (RECTAL) 30 MG Supp  Place 1 suppository rectally 2 (two) times daily.     isosorbide mononitrate 60 MG 24 hr tablet  Commonly known as:  IMDUR  Take 1 tablet (60 mg total) by mouth daily.     MAG-OXIDE PO  Take 250 mg by mouth daily.     metoprolol 50 MG tablet  Commonly known as:  LOPRESSOR  Take 1.5 tablets (75 mg total) by mouth 2 (two) times daily.     multivitamin with minerals tablet  Take 1 tablet by mouth daily.     nitroGLYCERIN 0.4 MG/SPRAY spray  Commonly known as:  NITROLINGUAL  Place 1 spray under the tongue every 5 (five) minutes as needed  for chest pain.     omeprazole 20 MG capsule  Commonly known as:  PRILOSEC  Take 20 mg by mouth daily.     ONE TOUCH ULTRA TEST test strip  Generic drug:  glucose blood  1 each by Other route as needed for other.     potassium chloride SA 20 MEQ tablet  Commonly known as:  K-DUR,KLOR-CON  Take 40 mEq by mouth every morning.     pravastatin 10 MG tablet  Commonly known as:  PRAVACHOL  TAKE ONE (1) TABLET BY MOUTH EVERY DAY     RA CALCIUM PLUS VITAMIN D 600-400 MG-UNIT tablet  Generic drug:  Calcium Carbonate-Vitamin D  Take 1 tablet by mouth 2 (two) times daily.     ticagrelor 90 MG Tabs tablet  Commonly known as:  BRILINTA  Take 1 tablet (90 mg total) by mouth 2 (two) times daily.     valACYclovir 1000 MG tablet  Commonly known as:  VALTREX  Take 1,000 mg by mouth 3 (three) times daily. X 7 days for cold sores     vitamin C 250 MG tablet  Commonly known as:  ASCORBIC ACID  Take 250 mg by mouth 2 (two) times daily.     VOLTAREN 1 % Gel  Generic drug:  diclofenac sodium  Apply topically as needed (for body aches).     ZOVIRAX 5 %  Generic drug:  acyclovir cream  1 application as needed for cold sores           Follow-up Information    Follow up with [facility], MD In 1 week.   Specialty:  General Surgery   Contact information:   [address] [place] [address] [phone]       Signed: Rolm Bookbinder 05/19/2015, 8:29 AM

## 2015-05-19 NOTE — Progress Notes (Signed)
AVS discharge instructions were reviewed with patient and her family. Patient was given prescription for Norco to take to her pharmacy. Patient and her husband was shown how to empty, milk, and record output for her JP drain, patient was also given written instructions on how to care for JP drain. Patient and her husband was also shown how to change dressing to her left masectomy incision and JP drain. Patient stated that she did not have any questions. Volunteers assisted patient to her transportation with her family.

## 2015-05-19 NOTE — Telephone Encounter (Signed)
[address], [place], [phone] EXT. [id number]) given patients CHF diagnosis in August 2014, EF 05/2014 55-60% November 2015, BP 123/52 September 2016 for their CHF program

## 2015-05-22 ENCOUNTER — Telehealth: Payer: Self-pay | Admitting: General Surgery

## 2015-05-22 NOTE — Telephone Encounter (Signed)
Pt calling about swelling drain output decreasing.  She had around 10 ml BID while in the hospital.  Drain output has gone down to nothing since going home.  She has some swelling and cannot see the outline of her pacemaker anymore.;  She denies nausea/vomiting/dizziness.  The swelling "is not bad," but is noticeable.    I discussed that I think she probably has a hematoma related to her brilinta that was just restarted after surgery, and it is probably too thick to come out the drain.    It sounds like she has a small hematoma that is clotted.  I do not think she needs to come to the ED today.  I will see if someone can see her in clinic tomorrow.

## 2015-05-28 ENCOUNTER — Telehealth: Payer: Self-pay | Admitting: Internal Medicine

## 2015-05-28 ENCOUNTER — Encounter: Payer: Self-pay | Admitting: Internal Medicine

## 2015-05-28 ENCOUNTER — Emergency Department (HOSPITAL_COMMUNITY)
Admission: EM | Admit: 2015-05-28 | Discharge: 2015-05-29 | Disposition: A | Discharge status: Home / Self Care | Payer: Medicare Other | Attending: Emergency Medicine | Admitting: Emergency Medicine

## 2015-05-28 ENCOUNTER — Encounter (HOSPITAL_COMMUNITY): Payer: Self-pay | Admitting: *Deleted

## 2015-05-28 DIAGNOSIS — I5032 Chronic diastolic (congestive) heart failure: Secondary | ICD-10-CM | POA: Diagnosis not present

## 2015-05-28 DIAGNOSIS — M1990 Unspecified osteoarthritis, unspecified site: Secondary | ICD-10-CM | POA: Diagnosis not present

## 2015-05-28 DIAGNOSIS — Z951 Presence of aortocoronary bypass graft: Secondary | ICD-10-CM | POA: Diagnosis not present

## 2015-05-28 DIAGNOSIS — R011 Cardiac murmur, unspecified: Secondary | ICD-10-CM | POA: Diagnosis not present

## 2015-05-28 DIAGNOSIS — N182 Chronic kidney disease, stage 2 (mild): Secondary | ICD-10-CM | POA: Diagnosis not present

## 2015-05-28 DIAGNOSIS — R002 Palpitations: Secondary | ICD-10-CM

## 2015-05-28 DIAGNOSIS — Z7982 Long term (current) use of aspirin: Secondary | ICD-10-CM | POA: Insufficient documentation

## 2015-05-28 DIAGNOSIS — I480 Paroxysmal atrial fibrillation: Secondary | ICD-10-CM | POA: Diagnosis not present

## 2015-05-28 DIAGNOSIS — Z79899 Other long term (current) drug therapy: Secondary | ICD-10-CM | POA: Diagnosis not present

## 2015-05-28 DIAGNOSIS — E119 Type 2 diabetes mellitus without complications: Secondary | ICD-10-CM | POA: Insufficient documentation

## 2015-05-28 DIAGNOSIS — Z85528 Personal history of other malignant neoplasm of kidney: Secondary | ICD-10-CM | POA: Insufficient documentation

## 2015-05-28 DIAGNOSIS — Z91040 Latex allergy status: Secondary | ICD-10-CM | POA: Insufficient documentation

## 2015-05-28 DIAGNOSIS — E860 Dehydration: Secondary | ICD-10-CM | POA: Diagnosis not present

## 2015-05-28 DIAGNOSIS — I129 Hypertensive chronic kidney disease with stage 1 through stage 4 chronic kidney disease, or unspecified chronic kidney disease: Secondary | ICD-10-CM | POA: Diagnosis not present

## 2015-05-28 DIAGNOSIS — Z880 Allergy status to penicillin: Secondary | ICD-10-CM | POA: Diagnosis not present

## 2015-05-28 DIAGNOSIS — Z8571 Personal history of Hodgkin lymphoma: Secondary | ICD-10-CM | POA: Diagnosis not present

## 2015-05-28 DIAGNOSIS — E785 Hyperlipidemia, unspecified: Secondary | ICD-10-CM | POA: Diagnosis not present

## 2015-05-28 DIAGNOSIS — D509 Iron deficiency anemia, unspecified: Secondary | ICD-10-CM | POA: Diagnosis not present

## 2015-05-28 DIAGNOSIS — I2510 Atherosclerotic heart disease of native coronary artery without angina pectoris: Secondary | ICD-10-CM | POA: Insufficient documentation

## 2015-05-28 DIAGNOSIS — Z7952 Long term (current) use of systemic steroids: Secondary | ICD-10-CM | POA: Insufficient documentation

## 2015-05-28 DIAGNOSIS — M7981 Nontraumatic hematoma of soft tissue: Secondary | ICD-10-CM | POA: Diagnosis not present

## 2015-05-28 DIAGNOSIS — K219 Gastro-esophageal reflux disease without esophagitis: Secondary | ICD-10-CM | POA: Insufficient documentation

## 2015-05-28 DIAGNOSIS — M810 Age-related osteoporosis without current pathological fracture: Secondary | ICD-10-CM | POA: Diagnosis not present

## 2015-05-28 DIAGNOSIS — G43909 Migraine, unspecified, not intractable, without status migrainosus: Secondary | ICD-10-CM | POA: Insufficient documentation

## 2015-05-28 DIAGNOSIS — Z950 Presence of cardiac pacemaker: Secondary | ICD-10-CM | POA: Diagnosis not present

## 2015-05-28 NOTE — ED Notes (Signed)
Pt c/o choking feeling, upper mid center chest pain that started this evening, reports that her hr would fluctuate,

## 2015-05-28 NOTE — ED Provider Notes (Signed)
CSN: [id number]     Arrival date & time 05/28/15  2321 History  By signing my name below, I, [name] [name], attest that this documentation has been prepared under the direction and in the presence of [name] Porter, MD at 2345. Electronically Signed: Meriel [name], ED Scribe. 05/28/2015. 12:14 AM.   Chief Complaint  Patient presents with  . Chest Pain    The history is provided by the patient. No language interpreter was used.   HPI Comments: [name] [name] is a 64 y.o. female, with a PMhx of CAD s/p CABG, PAF on Brilinta and Aspirin therapy, aortic stenosis, CHF, HLD, HTN, RCC s/p right nephrectomy, and DM who presents to the Emergency Department complaining of a tingling sensation in central, mid to upper chest that started at 10:30 pm  that lasted for 30 minutes and has resolved. Pt additionally describes the feeling as a choking sensation and associates SOB, palpitations, chills, nausea, and hyperglycemia reading over 300 at onset of CP. She bolused her insulin afterwards. She reports initially feeling nauseous after dinner that lasted for 1 hour, resolved, and then returned with her chest discomfort. The pt recalls a history of a similar feeling in her chest that resolved after a short amount of time when she was found to be in Afib. She states the A fib feeling has never lasted so long. She endorses feeling hot, diaphoretic, and nauseous currently but her CP and palpitations have resolved. The pt has a Medtronic pacemaker that was placed 4 years ago.She has a portable pulse ox at home and her pulse ox was 94-96% with HR that was jumping around from the 50's to 110.    Additionally, the pt is s/p mastectomy 9 days ago. She reports development of a hematoma to the surgical site 7 days ago and was evaluated by her surgeon 4 days ago who advised the pt to apply warm compresses to the area and the hematoma should resolve without drainage. She reports the area is gradually improving.   PCP: [name] Sites, MD Cardiology Dr [name] [name] Surgery Dr [name] [name]  Past Medical History  Diagnosis Date  . Aortic stenosis     a. bicuspid aortic valve; mean gradient of 20 mmHg in 2/10; b. s/p bioprosth AVR (19-mm Edwards pericardial valve) with a redo coronary artery bypass graft procedure in 07/2009; postoperative Dressler's syndrome    . Coronary artery disease     a. initial CABG with SVG-RCA in 12/05. b. redo SVG-RCA and bioprosthetic AVR in 1/11. d. 9/14 - PCI with DES to distal LM/proximal LAD was done rather than bypass as she would have been a poor candidate for redo sternotomy. d. S/p DES to prox LM 05/2014.  Marland Kitchen Chronic diastolic CHF (congestive heart failure) ([place])     a. 9.2014 EchoP EF 60-65%, no rwma, bioprosth AVR, mean gradient of 67mHg, mild to mod MR, PASP 518mg.  . Marland Kitchenyperlipidemia   . Hypertension   . Small bowel obstruction (HCC)     Recurrent; resolved after resection of a lipoma  . Osteopenia      hip on DEXA in October 2007.  . Marland Kitchennemia   . [place]    Laparoscopic right nephrectomy  . NASH (nonalcoholic steatohepatitis) 1999    -biopsy in 1999  . Diabetes mellitus 03/2010     TYPE II: Hemoglobin A1c of 7.4 in  03/2010; 8.4 in 06/2010; treated with insulin pump  . Migraines   . Duodenal ulcer  Remote; H. pylori positive  . Colitis, ischemic ([place]) 2008  . Gastroesophageal reflux disease     ? of GASTROPARESIS; IRRITABLE BOWEL SYNDROME  . Hodgkin's disease ([place]) 1991    [place]le radiation therapy  . Statin intolerance   . Hypercalcemia     Ca-11.1 in 07/2011  . Vitamin D deficiency   . Costochondritis   . Insomnia   . Iron deficiency anemia     a. 03/2013 EGD: essentially normal. Possible slow GIB.  Marland Kitchen Gastroparesis due to DM ([place]) 05/01/2012  . Hyperplastic gastric polyp 06/23/2012  . Arthritis   . Osteoporosis   . Pacemaker 08/21/2012    Medtronic Adapta L dual-chamber pacemaker-Dr. Lovena Le  . Anxiety   . Syncope     a. H/o recurrent  syncope with pauses on loop recorder s/p Medtronic pacemaker 07/2012.  . CKD (chronic kidney disease), stage II   . PAF (paroxysmal atrial fibrillation) ([place])     a.  h/o brief episodes noted on ppm interrogation. b. given GI bleeding and need to be on both ASA 81 and Brilinta, risks likely outweigh benefits of anticoagulation.   Marland Kitchen Dysrhythmia     sick sinus syndrome  . Heart murmur   . Hodgkin's disease ([place])     hx  . PONV (postoperative nausea and vomiting)     1. last surg Valve  suction caused sore on inner gum rt side, 2 ." be careful with neck tilt have had whiplash few times, 3. Hypotension   Past Surgical History  Procedure Laterality Date  . Total abdominal hysterectomy w/ bilateral salpingoophorectomy  2000  . Breast excisional biopsy      benign, bilateral  . Nephrectomy  2008    Laparoscopic right nephrectomy, renal cell carcinoma  . Coronary artery bypass graft  2005, 2011    CABG-most recent SVG to RCA-12/05; RCA occluded with patent graft in 2006; Redo bypass in 07/2009  . Tumor excision  1981    removal of Hodgkins lymphoma  . Exploratory laparotomy w/ bowel resection      Small bowel resection of lipoma  . Aortic valve replacement  2011    Aortic valve replacement surgery with a 19 mm bioprosthetic device post redo CABG January 2011.  . Colonoscopy      Multiple with adenomatous polyps  . Cervical biopsy    . Liver biopsy    . Bone marrow biopsy    . Esophagogastroduodenoscopy    . Esophagogastroduodenoscopy  06/23/2012    Procedure: ESOPHAGOGASTRODUODENOSCOPY (EGD);  Surgeon: [name] Mayer, MD;  Location: [facility];  Service: Endoscopy;  Laterality: N/A;  . Colonoscopy  06/23/2012    Procedure: COLONOSCOPY;  Surgeon: [name] Mayer, MD;  Location: [facility]OSCOPY;  Service: Endoscopy;  Laterality: N/A;  [name] dilation  06/23/2012    Procedure: [name] DILATION;  Surgeon: [name] Mayer, MD;  Location: [facility] ENDOSCOPY;  Service: Endoscopy;;  54 fr  . Pacemaker  insertion  08/21/2012    Medtronic Adapta L dual-chamber pacemaker  . Esophagogastroduodenoscopy N/A 04/06/2013    Procedure: ESOPHAGOGASTRODUODENOSCOPY (EGD);  Surgeon: Irene Shipper, MD;  Location: [facility] ENDOSCOPY;  Service: Endoscopy;  Laterality: N/A;  . Givens capsule study N/A 04/30/2013    Procedure: GIVENS CAPSULE STUDY;  Surgeon: [name] Mayer, MD;  Location: [facility];  Service: Endoscopy;  Laterality: N/A;  . Loop recorder implant N/A 12/10/2011    Procedure: LOOP RECORDER IMPLANT;  Surgeon: Evans Lance, MD;  Location: [facility] CATH LAB;  Service:  Cardiovascular;  Laterality: N/A;  . Permanent pacemaker insertion N/A 08/21/2012    Procedure: PERMANENT PACEMAKER INSERTION;  Surgeon: Evans Lance, MD;  Location: [facility] CATH LAB;  Service: Cardiovascular;  Laterality: N/A;  . Left and right heart catheterization with coronary angiogram N/A 04/07/2013    Procedure: LEFT AND RIGHT HEART CATHETERIZATION WITH CORONARY ANGIOGRAM;  Surgeon: Larey Dresser, MD;  Location: [facility] CATH LAB;  Service: Cardiovascular;  Laterality: N/A;  . Percutaneous coronary stent intervention (pci-s) N/A 04/09/2013    Procedure: PERCUTANEOUS CORONARY STENT INTERVENTION (PCI-S);  Surgeon: Burnell Blanks, MD;  Location: [facility] CATH LAB;  Service: Cardiovascular;  Laterality: N/A;  . Left heart catheterization with coronary/graft angiogram N/A 06/07/2014    Procedure: LEFT HEART CATHETERIZATION WITH Beatrix Fetters;  Surgeon: Burnell Blanks, MD;  Location: [facility] CATH LAB;  Service: Cardiovascular;  Laterality: N/A;  . Mastectomy Left 05/17/2015    total   . Total mastectomy Left 05/17/2015    Procedure: LEFT TOTAL MASTECTOMY;  Surgeon: Rolm Bookbinder, MD;  Location: [facility] OR;  Service: General;  Laterality: Left;   Family History  Problem Relation Age of Onset  . Heart attack Mother   . Heart disease Mother   . Heart attack Father   . Diabetes Father   . Heart disease Father   . Hypertension Brother   .  Glaucoma Brother   . Lung cancer Sister     meta  . Breast cancer Sister     half sister (Paternal)  . Ovarian cancer Sister   . Cervical cancer Sister     half sister (maternal)  . Breast cancer Sister   . Colon cancer Neg Hx   . Stomach cancer Neg Hx   . Lung cancer Sister     half sister (maternal)   Social History  Substance Use Topics  . Smoking status: Never Smoker   . Smokeless tobacco: Never Used  . Alcohol Use: No   Lives at home Lives with spouse  OB History    No data available     Review of Systems  Constitutional: Positive for chills and diaphoresis. Negative for fever.  Respiratory: Positive for shortness of breath.   Cardiovascular: Positive for chest pain and palpitations.  Gastrointestinal: Positive for nausea. Negative for vomiting.  All other systems reviewed and are negative.  Allergies  Cephalexin; Clindamycin hcl; Clindamycin/lincomycin; Humalog; Novolog; Penicillins; Tizanidine; Crestor; Gentamycin; Limonene; Atorvastatin; Avandia; Cholestyramine; Codeine; Metformin; Nickel; Plavix; Pravastatin; Ranitidine; Simvastatin; Tamiflu; Tramadol; Erythromycin; Iodinated diagnostic agents; Latex; Neosporin; and Neomycin-bacitracin-polymyxin   Home Medications   Prior to Admission medications   Medication Sig Start Date End Date Taking? Authorizing Provider  acyclovir cream (ZOVIRAX) 5 % 1 application as needed for cold sores 12/24/14   Historical Provider, MD  Alirocumab 75 MG/ML SOPN Inject 75 mg into the skin as directed. Every 2 weeks on Tuesday.  Will be taking Monday 10/24 prior to admission for surgery    Historical Provider, MD  ALPRAZolam Duanne Moron) 0.5 MG tablet Take 0.5 mg by mouth at bedtime. May take additional dose daily for anxiety    Historical Provider, MD  APIDRA 100 UNIT/ML injection INFUSE THROUGH INSULIN PUMP UTD. Has been receiving 82-125 units daily over last 10 days.  Asked pt to bring with her on admission 10/25 02/04/15   Historical  Provider, MD  aspirin EC 81 MG tablet Take 81 mg by mouth at bedtime.    Historical Provider, MD  azithromycin (ZITHROMAX) 500 MG tablet 1 tablet by  mouth one hour prior to dental appointment/procedure Patient taking differently: Take 500 mg by mouth See admin instructions. 1 tablet by mouth one hour prior to dental appointment/procedure 12/21/13   Larey Dresser, MD  Calcium Carbonate-Vitamin D (RA CALCIUM PLUS VITAMIN D) 600-400 MG-UNIT per tablet Take 1 tablet by mouth 2 (two) times daily.     Historical Provider, MD  Cholecalciferol 2000 UNITS TABS Take 1 tablet by mouth daily.    Historical Provider, MD  clobetasol (TEMOVATE) 0.05 % external solution Apply 1 application topically daily as needed (for hives).     Historical Provider, MD  desoximetasone (TOPICORT) 0.05 % cream Apply 1 application topically 2 (two) times daily as needed. When skin starts peeling    Historical Provider, MD  diclofenac sodium (VOLTAREN) 1 % GEL Apply topically as needed (for body aches).     Historical Provider, MD  docusate sodium (COLACE) 100 MG capsule Take 1 capsule (100 mg total) by mouth 2 (two) times daily. Patient taking differently: Take 100 mg by mouth daily. Will take 2nd dose at bedtime if constipated 04/11/13   Rogelia Mire, NP  ferrous fumarate (HEMOCYTE - 106 MG FE) 325 (106 FE) MG TABS tablet Take 1 tablet (106 mg of iron total) by mouth 2 (two) times daily. Or as directed. Take with Vitamin -C Patient taking differently: Take 1 tablet by mouth 2 (two) times daily.  02/09/15   Lennis Marion Downer, MD  furosemide (LASIX) 40 MG tablet Take 40 mg by mouth daily as needed for fluid (if weight increases >= 2 lb).    Historical Provider, MD  furosemide (LASIX) 80 MG tablet Take 1 tablet (80 mg total) by mouth daily. 10/07/14   Larey Dresser, MD  glucagon (GLUCAGON EMERGENCY) 1 MG injection Inject 1 mg into the vein once as needed (for severe reaction).    Historical Provider, MD  HYDROcodone-acetaminophen  (NORCO/VICODIN) 5-325 MG tablet Take 1-2 tablets by mouth every 6 (six) hours as needed for moderate pain or severe pain. 05/19/15   Rolm Bookbinder, MD  hydrocortisone (ANUSOL-HC) 25 MG suppository Place 25 mg rectally 2 (two) times daily.    Historical Provider, MD  HYDROCORTISONE ACE, RECTAL, 30 MG SUPP Place 1 suppository rectally 2 (two) times daily. 05/11/15   Historical Provider, MD  isosorbide mononitrate (IMDUR) 60 MG 24 hr tablet Take 1 tablet (60 mg total) by mouth daily. Patient taking differently: Take 60 mg by mouth at bedtime.  10/07/14   Larey Dresser, MD  Magnesium Oxide (MAG-OXIDE PO) Take 250 mg by mouth daily.    Historical Provider, MD  metoprolol (LOPRESSOR) 50 MG tablet Take 1.5 tablets (75 mg total) by mouth 2 (two) times daily. 12/30/14   Evans Lance, MD  Multiple Vitamins-Minerals (MULTIVITAMIN WITH MINERALS) tablet Take 1 tablet by mouth daily.      Historical Provider, MD  nitroGLYCERIN (NITROLINGUAL) 0.4 MG/SPRAY spray Place 1 spray under the tongue every 5 (five) minutes as needed for chest pain. Patient taking differently: Place 1 spray under the tongue every 5 (five) minutes as needed for chest pain. Has not needed recently 03/10/15   Evans Lance, MD  omeprazole (PRILOSEC) 20 MG capsule Take 20 mg by mouth daily.    Historical Provider, MD  ONE TOUCH ULTRA TEST test strip 1 each by Other route as needed for other.  03/15/14   Historical Provider, MD  potassium chloride SA (K-DUR,KLOR-CON) 20 MEQ tablet Take 40 mEq by mouth every morning.  Historical Provider, MD  pravastatin (PRAVACHOL) 10 MG tablet TAKE ONE (1) TABLET BY MOUTH EVERY DAY 10/07/14   Larey Dresser, MD  ticagrelor (BRILINTA) 90 MG TABS tablet Take 1 tablet (90 mg total) by mouth 2 (two) times daily. Patient taking differently: Take 90 mg by mouth 2 (two) times daily. Last dose prior to surgery, 10/20 PM 10/07/14   Larey Dresser, MD  valACYclovir (VALTREX) 1000 MG tablet Take 1,000 mg by mouth 3  (three) times daily. X 7 days for cold sores 12/24/14   Historical Provider, MD  vitamin C (ASCORBIC ACID) 250 MG tablet Take 250 mg by mouth 2 (two) times daily.    Historical Provider, MD  Wheat Dextrin (BENEFIBER PO) Take 45 mLs by mouth every morning. 3 tablespoons each morning    Historical Provider, MD   BP 158/68 mmHg  Pulse 95  Temp(Src) 97.6 F (36.4 C) (Oral)  Resp 11  Wt 125 lb (56.7 kg)  SpO2 98%  Vital signs normal   Physical Exam  Constitutional: She is oriented to person, place, and time. She appears well-developed and well-nourished.  Non-toxic appearance. She does not appear ill. No distress.  HENT:  Head: Normocephalic and atraumatic.  Right Ear: External ear normal.  Left Ear: External ear normal.  Nose: Nose normal. No mucosal edema or rhinorrhea.  Mouth/Throat: Oropharynx is clear and moist and mucous membranes are normal. No dental abscesses or uvula swelling.  Eyes: Conjunctivae and EOM are normal. Pupils are equal, round, and reactive to light.  Neck: Normal range of motion and full passive range of motion without pain. Neck supple.  Cardiovascular: Normal rate, regular rhythm and normal heart sounds.  Exam reveals no gallop and no friction rub.   No murmur heard. Pulmonary/Chest: Effort normal and breath sounds normal. No respiratory distress. She has no wheezes. She has no rhonchi. She has no rales. She exhibits no tenderness and no crepitus.    Pt has a healing surgical incision with a small amount of dry blood and a hematoma of the superior, medial chest that pt and her husband state is getting smaller.   Red surgical incision Blue hematoma  Abdominal: Soft. Normal appearance and bowel sounds are normal. She exhibits no distension. There is no tenderness. There is no rebound and no guarding.  Musculoskeletal: Normal range of motion. She exhibits no edema or tenderness.  Moves all extremities well.   Neurological: She is alert and oriented to person,  place, and time. She has normal strength. No cranial nerve deficit.  Skin: Skin is warm, dry and intact. No rash noted. No erythema. No pallor.  Psychiatric: She has a normal mood and affect. Her speech is normal and behavior is normal. Her mood appears not anxious.  Nursing note and vitals reviewed.   ED Course  Procedures  DIAGNOSTIC STUDIES: Oxygen Saturation is 98% on RA, normal by my interpretation.    COORDINATION OF CARE: 12:03 AM Discussed treatment plan with pt at bedside and pt agreed to plan. Will order cardiac workup.  Patient was noted to be in normal sinus rhythm with heart rate in the 70s during my exam.  I reviewed her cardiac monitor and she has been in NSR since she has been on the monitor.  12:50 AM Discussed test results with labs showing mild dehydration. Will give 1 L bolus of fluids. Nurse interrogated Psychologist, forensic. Interrogation from Medtronic pacemaker reveals pt was in Atrial Fibrillation at 7:38 PM for 3 minutes and from  10:34 PM to 10:55PM with a HR of 140 bpm.   01:00 AM pt given the results of her pacemaker interrogation.   Pt's IV infiltrated, states she will do oral hydration at home.    Labs Review Results for orders placed or performed during the hospital encounter of 29/52/84  Basic metabolic panel  Result Value Ref Range   Sodium 140 135 - 145 mmol/L   Potassium 3.3 (L) 3.5 - 5.1 mmol/L   Chloride 100 (L) 101 - 111 mmol/L   CO2 27 22 - 32 mmol/L   Glucose, Bld 238 (H) 65 - 99 mg/dL   BUN 31 (H) 6 - 20 mg/dL   Creatinine, Ser 1.12 (H) 0.44 - 1.00 mg/dL   Calcium 10.3 8.9 - 10.3 mg/dL   GFR calc non Af Amer 52 (L) >60 mL/min   GFR calc Af Amer 60 (L) >60 mL/min   Anion gap 13 5 - 15  CBC with Differential  Result Value Ref Range   WBC 14.1 (H) 4.0 - 10.5 K/uL   RBC 4.44 3.87 - 5.11 MIL/uL   Hemoglobin 13.8 12.0 - 15.0 g/dL   HCT 41.0 36.0 - 46.0 %   MCV 92.3 78.0 - 100.0 fL   MCH 31.1 26.0 - 34.0 pg   MCHC 33.7 30.0 - 36.0 g/dL   RDW 13.3  11.5 - 15.5 %   Platelets 173 150 - 400 K/uL   Neutrophils Relative % 77 %   Lymphocytes Relative 20 %   Monocytes Relative 2 %   Eosinophils Relative 0 %   Basophils Relative 1 %   Band Neutrophils 0 %   Metamyelocytes Relative 0 %   Myelocytes 0 %   Promyelocytes Absolute 0 %   Blasts 0 %   nRBC 0 0 /100 WBC   Other 0 %   Neutro Abs 10.9 (H) 1.7 - 7.7 K/uL   Lymphs Abs 2.8 0.7 - 4.0 K/uL   Monocytes Absolute 0.3 0.1 - 1.0 K/uL   Eosinophils Absolute 0.0 0.0 - 0.7 K/uL   Basophils Absolute 0.1 0.0 - 0.1 K/uL  Troponin I  Result Value Ref Range   Troponin I <0.03 <0.031 ng/mL   *Note: Due to a large number of results and/or encounters for the requested time period, some results have not been displayed. A complete set of results can be found in Results Review.   Laboratory interpretation all normal except leukocytosis, mild elevation of her BUN/creat c/w mild dehydration that is being treated with 1 liter of NS    Imaging Review Dg Chest Port 1 View  05/29/2015  CLINICAL DATA:  Choking sensation, mid chest pain beginning this evening. History of diabetes, hypertension, breast cancer, CHF. EXAM: PORTABLE CHEST 1 VIEW COMPARISON:  Chest radiograph June 06, 2014. FINDINGS: Cardiomediastinal silhouette is normal. Mildly calcified aortic knob. Status post median sternotomy for cardiac valve replacement. Mildly elevated RIGHT hemidiaphragm without pleural effusion or focal consolidation. No pneumothorax. Dual lead LEFT cardiac pacemaker in situ. Soft tissue planes and included osseous structures are nonsuspicious. IMPRESSION: No acute cardiopulmonary process. Electronically Signed   By: Elon Alas M.D.   On: 05/29/2015 00:35   I have personally reviewed and evaluated these images and lab results as part of my medical decision-making.   EKG Interpretation   Date/Time:  Saturday May 28 2015 23:30:43 EDT Ventricular Rate:  93 PR Interval:    QRS Duration: 98 QT  Interval:  431 QTC Calculation: 536 R Axis:   5  Text Interpretation:  Undetermined rhythm Probable left ventricular  hypertrophy Abnrm T, consider ischemia, anterolateral lds Prolonged QT  interval Baseline wander in lead(s) I aVR aVL Confirmed by Maleeya Peterkin  MD-I,  Shaughn Thomley ([id number]) on 05/29/2015 12:02:55 AM      MDM   Final diagnoses:  Palpitations  Paroxysmal atrial fibrillation ([place])  Dehydration   Plan discharge  [name] Porter, MD, FACEP   I personally performed the services described in this documentation, which was scribed in my presence. The recorded information has been reviewed and considered.  [name] Porter, MD, Barbette Or, MD 05/29/15 [phone]

## 2015-05-28 NOTE — Telephone Encounter (Signed)
Called patient who is having palpitations with diaphoresis and choking sensation. Patient had prior history of CABG and symptoms are concerning of possible MI. She was told to call 911 and go to the nearest ED for evaluation.

## 2015-05-29 ENCOUNTER — Emergency Department (HOSPITAL_COMMUNITY): Payer: Medicare Other

## 2015-05-29 LAB — CBC WITH DIFFERENTIAL/PLATELET
BASOS ABS: 0.1 10*3/uL (ref 0.0–0.1)
BLASTS: 0 %
Band Neutrophils: 0 %
Basophils Relative: 1 %
Eosinophils Absolute: 0 10*3/uL (ref 0.0–0.7)
Eosinophils Relative: 0 %
HEMATOCRIT: 41 % (ref 36.0–46.0)
HEMOGLOBIN: 13.8 g/dL (ref 12.0–15.0)
Lymphocytes Relative: 20 %
Lymphs Abs: 2.8 10*3/uL (ref 0.7–4.0)
MCH: 31.1 pg (ref 26.0–34.0)
MCHC: 33.7 g/dL (ref 30.0–36.0)
MCV: 92.3 fL (ref 78.0–100.0)
METAMYELOCYTES PCT: 0 %
MYELOCYTES: 0 %
Monocytes Absolute: 0.3 10*3/uL (ref 0.1–1.0)
Monocytes Relative: 2 %
NEUTROS ABS: 10.9 10*3/uL — AB (ref 1.7–7.7)
Neutrophils Relative %: 77 %
Other: 0 %
PROMYELOCYTES ABS: 0 %
Platelets: 173 10*3/uL (ref 150–400)
RBC: 4.44 MIL/uL (ref 3.87–5.11)
RDW: 13.3 % (ref 11.5–15.5)
WBC: 14.1 10*3/uL — ABNORMAL HIGH (ref 4.0–10.5)
nRBC: 0 /100 WBC

## 2015-05-29 LAB — BASIC METABOLIC PANEL
ANION GAP: 13 (ref 5–15)
BUN: 31 mg/dL — ABNORMAL HIGH (ref 6–20)
CALCIUM: 10.3 mg/dL (ref 8.9–10.3)
CO2: 27 mmol/L (ref 22–32)
CREATININE: 1.12 mg/dL — AB (ref 0.44–1.00)
Chloride: 100 mmol/L — ABNORMAL LOW (ref 101–111)
GFR calc non Af Amer: 52 mL/min — ABNORMAL LOW (ref >60)
GFR, EST AFRICAN AMERICAN: 60 mL/min — AB (ref >60)
GLUCOSE: 238 mg/dL — AB (ref 65–99)
Potassium: 3.3 mmol/L — ABNORMAL LOW (ref 3.5–5.1)
Sodium: 140 mmol/L (ref 135–145)

## 2015-05-29 LAB — TROPONIN I: Troponin I: <0.03 ng/mL (ref <0.031)

## 2015-05-29 MED ORDER — SODIUM CHLORIDE 0.9 % IV BOLUS (SEPSIS): 1000.0000 mL | Freq: Once | INTRAVENOUS | Status: DC

## 2015-05-29 NOTE — ED Notes (Addendum)
Pacer interrogated.

## 2015-05-29 NOTE — Discharge Instructions (Signed)
Drink more fluids. Follow up with [name] as scheduled on Nov. 7th about the hematoma on your chest. You can call Dr Claris Gladden office on Monday, Nov. 7th, to let him know you were having the episodes of atrial fibrillation.  Return to the ED if you feel worse.

## 2015-05-31 ENCOUNTER — Telehealth: Payer: Self-pay | Admitting: Internal Medicine

## 2015-05-31 NOTE — Telephone Encounter (Signed)
Spoke to patient about episode from 11/5 that sent her to the ER. Patient states that she felt like she was "choking" in addition to a "fluttering feeling in her chest". She sent in a remote transmission which showed that at the time of the episode she was in AF. Patient states that the episode self terminated by the time she got to the hospital ([facility] confirmed)..  Patient wanted to know if she needed to go to ER for these episodes or what she should do. I explained to patient that it was not necessary to go to the hospital every time she has an episode of AF. I explained to her that sometimes the MD will try the "pill in the pocket" approach with the patient first to alleviate sx's.   Will defer to Dr.Taylor and call patient with any further recommendations.

## 2015-05-31 NOTE — Telephone Encounter (Signed)
New problem   Pt was seen in ER on 11.5.16 and want device clinic to look at her medtronic device numbers to see if she need to come in office.

## 2015-06-05 ENCOUNTER — Other Ambulatory Visit: Payer: Self-pay | Admitting: Oncology

## 2015-06-05 DIAGNOSIS — D0512 Intraductal carcinoma in situ of left breast: Secondary | ICD-10-CM

## 2015-06-05 DIAGNOSIS — D500 Iron deficiency anemia secondary to blood loss (chronic): Secondary | ICD-10-CM

## 2015-06-06 ENCOUNTER — Encounter: Payer: Self-pay | Admitting: Oncology

## 2015-06-06 ENCOUNTER — Other Ambulatory Visit (HOSPITAL_BASED_OUTPATIENT_CLINIC_OR_DEPARTMENT_OTHER): Payer: Medicare Other

## 2015-06-06 ENCOUNTER — Ambulatory Visit (HOSPITAL_BASED_OUTPATIENT_CLINIC_OR_DEPARTMENT_OTHER): Payer: Medicare Other | Admitting: Oncology

## 2015-06-06 VITALS — BP 152/61 | HR 76 | Temp 97.6°F | Resp 17 | Ht 63.0 in | Wt 123.7 lb

## 2015-06-06 DIAGNOSIS — Z8571 Personal history of Hodgkin lymphoma: Secondary | ICD-10-CM

## 2015-06-06 DIAGNOSIS — T66XXXD Radiation sickness, unspecified, subsequent encounter: Secondary | ICD-10-CM

## 2015-06-06 DIAGNOSIS — C641 Malignant neoplasm of right kidney, except renal pelvis: Secondary | ICD-10-CM

## 2015-06-06 DIAGNOSIS — D696 Thrombocytopenia, unspecified: Secondary | ICD-10-CM | POA: Diagnosis not present

## 2015-06-06 DIAGNOSIS — D500 Iron deficiency anemia secondary to blood loss (chronic): Secondary | ICD-10-CM

## 2015-06-06 DIAGNOSIS — D0512 Intraductal carcinoma in situ of left breast: Secondary | ICD-10-CM

## 2015-06-06 LAB — CBC WITH DIFFERENTIAL/PLATELET
BASO%: 1.2 % (ref 0.0–2.0)
Basophils Absolute: 0.1 10*3/uL (ref 0.0–0.1)
EOS%: 5.7 % (ref 0.0–7.0)
Eosinophils Absolute: 0.5 10*3/uL (ref 0.0–0.5)
HEMATOCRIT: 38.8 % (ref 34.8–46.6)
HEMOGLOBIN: 12.7 g/dL (ref 11.6–15.9)
LYMPH#: 1.5 10*3/uL (ref 0.9–3.3)
LYMPH%: 17.9 % (ref 14.0–49.7)
MCH: 30.1 pg (ref 25.1–34.0)
MCHC: 32.8 g/dL (ref 31.5–36.0)
MCV: 91.7 fL (ref 79.5–101.0)
MONO#: 0.6 10*3/uL (ref 0.1–0.9)
MONO%: 7.6 % (ref 0.0–14.0)
NEUT%: 67.6 % (ref 38.4–76.8)
NEUTROS ABS: 5.6 10*3/uL (ref 1.5–6.5)
PLATELETS: 138 10*3/uL — AB (ref 145–400)
RBC: 4.23 10*6/uL (ref 3.70–5.45)
RDW: 13.9 % (ref 11.2–14.5)
WBC: 8.3 10*3/uL (ref 3.9–10.3)

## 2015-06-06 LAB — COMPREHENSIVE METABOLIC PANEL (CC13)
ALT: 32 U/L (ref 0–55)
AST: 28 U/L (ref 5–34)
Albumin: 3.9 g/dL (ref 3.5–5.0)
Alkaline Phosphatase: 156 U/L — ABNORMAL HIGH (ref 40–150)
Anion Gap: 10 mEq/L (ref 3–11)
BILIRUBIN TOTAL: 0.44 mg/dL (ref 0.20–1.20)
BUN: 26.5 mg/dL — AB (ref 7.0–26.0)
CALCIUM: 10.3 mg/dL (ref 8.4–10.4)
CHLORIDE: 104 meq/L (ref 98–109)
CO2: 29 mEq/L (ref 22–29)
CREATININE: 1 mg/dL (ref 0.6–1.1)
EGFR: 60 mL/min/{1.73_m2} — ABNORMAL LOW (ref >90)
Glucose: 168 mg/dl — ABNORMAL HIGH (ref 70–140)
Potassium: 4 mEq/L (ref 3.5–5.1)
Sodium: 143 mEq/L (ref 136–145)
TOTAL PROTEIN: 7.5 g/dL (ref 6.4–8.3)

## 2015-06-06 NOTE — Progress Notes (Signed)
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  Last bilateral mammograms were at [facility] 03-14-15.  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 She had no intraoperative or post operative complications in hospital and was DC home on POD #2. Patient saw Dr WDonne Hazelon 05-30-15 and has had JP removed. She has had progressive discomfort and tightness in area of mastectomy since then, no drainage or erythema; she is to see Dr WDonne Hazelagain on 06-13-15. She feels some muscle spasm in LUE, is not doing any stretching or exercising as yet. She is using nothing for the discomfort, as she is cautious about tylenol due to liver, is too sedated from 1 tramadol and is on Brilinta and ASA.  She has had no fever, SOB, cough. She was evaluated in ED 05-29-15 for symptomatic episode of A fib, with pacer checked. She denies overt bleeding. She has been eating and has tried to push fluids since ED. She has no dysuria. Blood sugars have been somewhat more elevated, not unexpected. No LE swelling or pain.  Remainder of 10 point Review of Systems negative.  She has pacemaker, so cannot have breast MRI. No PAC Has not had genetics testing. 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 She has had no further active Hodgkins.   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   DC[facility], with heterogeneously dense breast tissue, heterogeneous calcification left breast at 3:00 posterior and otherwise no findings of concern. She had left diagnostic mammogram at [facility] 03-16-15 with suspicious grouped calcifications identified. 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  [facility] with no evidence of malignancy. After input from general surgery, radiation oncology, cardiology and medical oncology, she had left total mastectomy by Dr WDonne Hazelon 05-17-15. 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 Brilinta and ASA were not held for that bleeding; she continues ferrous fumarate, last iron studies in 10-2014 had serum iron 68 and %sat 23. She has had some variable mildly low platelets not significantly changed in past year.  She [facility] and for hypothyroidism on replacement (hypothyroid also related to remote mantle radiation for Hodgkins). [facility] Diabetes is also complicated by gastroparesis.  [facility]   Variable mild thrombocytopenia  Objective:  Vital signs in last 24 hours:  BP 152/61 mmHg  Pulse 76  Temp(Src) 97.6 F (36.4 C) (Oral)  Resp 17  Ht _0  (1.6 m)  Wt 123 lb 11.2 oz (56.11 kg)   BMI 21.92 kg/m2  SpO2 98% Weight down 1 lb Alert, oriented and appropriate, very pleasant as always, husband very supportive. Ambulatory without assistance. Looks mildly uncomfortable but NAD. Respirations not labored RA  HEENT:PERRL, sclerae not icteric. Oral mucosa moist without lesions, posterior pharynx clear.  Neck supple. No JVD.  Lymphatics:no cervical,supraclavicular, axillary adenopathy Resp: clear to auscultation bilaterally and normal percussion bilaterally Cardio: regular rate and rhythm. No gallop. GI: soft, nontender, not distended. Diminished bowel sounds. Insulin pump in place Musculoskeletal/ Extremities: without pitting edema, cords, tenderness Neuro: speech fluent, CN, motor, cerebellar nonfocal Skin without rash, ecchymosis, petechiae Breasts: Right breast without dominant mass, skin or nipple findings. Axillae benign.Left mastectomy incision closed, no erythema or heat, no drainage. Fullness especially inferior to incision not quite tight, likely seroma, no heat or erythema  Lab Results:  Results for orders placed or performed in visit on 06/06/15  CBC with Differential  Result Value Ref Range   WBC 8.3 3.9 - 10.3 10e3/uL   NEUT# 5.6 1.5 - 6.5 10e3/uL   HGB 12.7 11.6 - 15.9 g/dL   HCT 38.8 34.8 - 46.6 %   Platelets 138 (L) 145 - 400 10e3/uL   MCV 91.7 79.5 - 101.0 fL   MCH 30.1 25.1 - 34.0 pg   MCHC 32.8 31.5 - 36.0 g/dL   RBC 4.23 3.70 - 5.45 10e6/uL   RDW 13.9 11.2 - 14.5 %   lymph# 1.5 0.9 - 3.3 10e3/uL   MONO# 0.6 0.1 - 0.9 10e3/uL   Eosinophils Absolute 0.5 0.0 - 0.5 10e3/uL   Basophils Absolute 0.1 0.0 - 0.1 10e3/uL   NEUT% 67.6 38.4 - 76.8 %   LYMPH% 17.9 14.0 - 49.7 %   MONO% 7.6 0.0 - 14.0 %   EOS% 5.7 0.0 - 7.0 %   BASO% 1.2 0.0 - 2.0 %  Comprehensive metabolic panel (Cmet) - [facility]  Result Value Ref Range   Sodium 143 136 - 145 mEq/L   Potassium 4.0 3.5 - 5.1 mEq/L   Chloride 104 98 - 109 mEq/L   CO2 29 22 - 29 mEq/L   Glucose 168 (H) 70 -  140 mg/dl   BUN 26.5 (H) 7.0 - 26.0 mg/dL   Creatinine 1.0 0.6 - 1.1 mg/dL   Total Bilirubin 0.44 0.20 - 1.20 mg/dL   Alkaline Phosphatase 156 (H) 40 - 150 U/L   AST 28 5 - 34 U/L   ALT 32 0 - 55 U/L   Total Protein 7.5 6.4 - 8.3 g/dL   Albumin 3.9 3.5 - 5.0 g/dL   Calcium 10.3 8.4 - 10.4 mg/dL   Anion Gap 10 3 - 11 mEq/L   EGFR 60 (L) >90 ml/min/1.73 m2   *Note: Due to a large number of results and/or encounters for the requested time period, some results have not been displayed. A complete set of results can be found in Results Review.     Studies/Results: VANA, ARIF Collected: 05/17/2015 Client: [place] Accession: [id number] Received: 05/17/2015 Rolm Bookbinder EPORT OF SURGICAL PATHOLOGY FINAL DIAGNOSIS Diagnosis Breast, simple mastectomy, left - HIGH GRADE DUCTAL CARCINOMA IN SITU, SEE COMMENT. - CALCIFICATIONS PRESENT. - PREVIOUS BIOPSY SITE IDENTIFIED. - TUMOR IS 1 MM FROM NEAREST MARGIN (ANTERIOR). - SEE TUMOR  SYNOPTIC TEMPLATE BELOW. Microscopic Comment BREAST, IN SITU CARCINOMA Specimen, including laterality: Left breast Procedure (include lymph node sampling sentinel-non-sentinel Simple mastectomy without sentinel lymph node sampling Grade of carcinoma: 3 of 3 Necrosis: Absent Estimated tumor size: (glass slide measurement): 1.0 cm, see comment Treatment effect: None If present, treatment effect in breast tissue, lymph nodes or both: N/A Distance to closest margin: 1 mm (anterior) If margin positive, focally or broadly: N/A Breast prognostic profile: Estrogen receptor: Not repeated, previous study demonstrated 0% positivity ([id number]) Progesterone receptor: Not repeated, previous study demonstrated 0% positivity ([id number]) Lymph nodes: Examined: 0 Sentinel 0 Non-sentinel 0 Total Lymph nodes with metastasis: N/A Isolated tumor cells (< 0.2 mm): N/A Micrometastasis ( > 0.2 mm and < 2.0 mm): N/A Macrometastasis (> 2.0 mm):  N/A Extranodal extension: N/A  TNM: pTis, pNX Comments: Although there is no biopsy clip grossly identified, the previous biopsy site was identified within random tissue sections. There are multiple foci of high grade ductal carcinoma in situ present. The largest expanse of tumor is 1.0 cm. Although the previous biopsy site involves the anterior margin, in situ carcinoma is 1 mm from the nearest anterior margin (slide 1S). Additional pathologic findings include previous biopsy site tissue changes, sclerosing adenosis, usual and florid duct hyperplasia, fibrocystic change and complex sclerosing lesions. Calcifications are identified. The surgical resection margin(s) of the specimen were inked and microscopically evaluated.  Medications: I have reviewed the patient's current medications. OK to take tylenol 325 mg 1-2x daily for next several days if this helps with local discomfort from mastectomy. OK to use 1/2 of 50 mg tramadol q 8 hrs if helpful for mastectomy discomfort. Take miralax today and daily as needed to keep bowels moving daily for now. Note she tolerated MS with no pruritis in hospital  DISCUSSION: Pathology information reviewed. We are all delighted that she tolerated surgery this well and are entirely comfortable with this choice of treatment. Patient tells me that she is glad that she did not have right prophylactic mastectomy due to healing necessary from the unilateral mastectomy now.  I have spoken with Dr Cristal Generous office, who will contact patient to be seen for possible seroma at mastectomy site. I have encouraged her to try tylenol 1-2x daily and/ or 1/2 tramadol, at least until seen back by surgery.   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 Post left total mastectomy 05-17-15, which really was accomplished well given comorbid conditions. Path as noted, will follow with observation from this office. As this DCIS was likely caused  result of remote mantle radiation, she is at risk also in remaining right breast, which will be followed closely. I will see her back in ~ 4 months or sooner if needed. 3.variable, mild thrombocytopenia for at least a year, possibly medication related. No apparent bleeding now, count stable 4.previous anemia related to intermittent slow GI bleeding, minimally lower but still in good range at 12.7 since mastectomy. On oral iron. Colonoscopy 06-2012 no polyps, thought to have had ischemic colitis 2014. NOTE needs lasix if PRBCs. 5.history of CAD s/p CABG in 12/05 and redo in 1/11, bioprosthetic AVR, diastolic CHF, and sick sinus syndrome s/p PCM. DES from left main into proximal LAD 03-2013; re-developed angina in 11/15 with DES to ostial LM. Last echo 11/15 EF 55-60%, normal bioprosthetic aortic valve, mild-moderate MR, moderate TR.On Brilinta and ASA as poor responder to plavix previously. Paroxysmal A fib evaluated at ED 05-28-15.   Followed closely by Dr  McLean. 6.IIA nodular sclerosing Hodgkins treated with mantle radiation in 1980s, not recurrent. Radiation likely or possibly pertinent to hypothyroidism, CAD and this breast cancer. 7.type II diabetes on insulin pump: followed by Dr Frederik Pear at [facility], on insulin pump. Diabetic gastroparesis. 8.Carotid stenosis: Carotid dopplers (2/15) with 40-59% bilateral stenosis. Carotid dopplers (3/16) with 40-59% bilateral ICA stenosis.  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 EGD 05-2013 no varices. Reassured that low dose tylenol 1-2x daily until sees [name] ok for significant discomfort at mastectomy site now. 10..post right nephrectomy for renal cell ca 2008. She is followed yearly by Dr Lorrene Reid of nephrology, but stable without other concerns at visit 03-2015. 11. Hypothyroidism on replacement, also followed by Dr Frederik Pear. 12.osteopenia by bone density scan [facility] 11-23-14 13.post TAH BSO 14.flu vaccine 04-18-15. 15.possible seroma/  hematoma at surgical site since removal of drain a week ago, symptomatic. Appreciate assistance from Dr Cristal Generous office with evaluation this week. To try some careful analgesics until seen.  All questions answered. Patient and husband know that they are welcome to call if I can be of help prior to next scheduled appointment. Time spent 25 min including >50% counseling and coordination of care. Cc Drs Donne Hazel, Frederik Pear, [place], Tery Sanfilippo, MD   06/06/2015, 4:56 PM

## 2015-06-10 ENCOUNTER — Ambulatory Visit: Payer: Medicare Other | Attending: General Surgery | Admitting: Physical Therapy

## 2015-06-10 DIAGNOSIS — R293 Abnormal posture: Secondary | ICD-10-CM | POA: Diagnosis present

## 2015-06-10 DIAGNOSIS — M25612 Stiffness of left shoulder, not elsewhere classified: Secondary | ICD-10-CM | POA: Diagnosis present

## 2015-06-10 DIAGNOSIS — R609 Edema, unspecified: Secondary | ICD-10-CM | POA: Diagnosis present

## 2015-06-10 DIAGNOSIS — M25512 Pain in left shoulder: Secondary | ICD-10-CM | POA: Diagnosis not present

## 2015-06-10 NOTE — Therapy (Signed)
[place], [place], [address] Phone: [phone]   Fax:  [phone]  Physical Therapy Evaluation  Patient Details  Name: [name] [name]: [id number] Date of Birth: [birth year] Referring Provider: Donne Hazel   Encounter Date: 06/10/2015      PT End of Session - 06/10/15 1301    Date for PT Re-Evaluation 08/10/15      Past Medical History  Diagnosis Date  . Aortic stenosis     a. bicuspid aortic valve; mean gradient of 20 mmHg in 2/10; b. s/p bioprosth AVR (19-mm Edwards pericardial valve) with a redo coronary artery bypass graft procedure in 07/2009; postoperative Dressler's syndrome    . Coronary artery disease     a. initial CABG with SVG-RCA in 12/05. b. redo SVG-RCA and bioprosthetic AVR in 1/11. d. 9/14 - PCI with DES to distal LM/proximal LAD was done rather than bypass as she would have been a poor candidate for redo sternotomy. d. S/p DES to prox LM 05/2014.  Marland Kitchen Chronic diastolic CHF (congestive heart failure) ([place])     a. 9.2014 EchoP EF 60-65%, no rwma, bioprosth AVR, mean gradient of 58mHg, mild to mod MR, PASP 516mg.  . Marland Kitchenyperlipidemia   . Hypertension   . Small bowel obstruction (HCC)     Recurrent; resolved after resection of a lipoma  . Osteopenia      hip on DEXA in October 2007.  . Marland Kitchennemia   . Carcinoma, renal cell ([facility]CD[place]11/2008    Laparoscopic right nephrectomy  . NASH (nonalcoholic steatohepatitis) 1999    -biopsy in 1999  . Diabetes mellitus 03/2010     TYPE II: Hemoglobin A1c of 7.4 in  03/2010; 8.4 in 06/2010; treated with insulin pump  . Migraines   . Duodenal ulcer     Remote; H. pylori positive  . Colitis, ischemic (HCM[facility] Bridge2008  . Gastroesophageal reflux disease     ? of GASTROPARESIS; IRRITABLE BOWEL SYNDROME  . Hodgkin's disease ([facility]CT[place]1991    [place]le radiation therapy  . Statin intolerance   . Hypercalcemia     Ca-11.1 in 07/2011  . Vitamin D deficiency   .  Costochondritis   . Insomnia   . Iron deficiency anemia     a. 03/2013 EGD: essentially normal. Possible slow GIB.  . Marland Kitchenastroparesis due to DM (HCLa [place]10/04/2012  . Hyperplastic gastric polyp 06/23/2012  . Arthritis   . Osteoporosis   . Pacemaker 08/21/2012    Medtronic Adapta L dual-chamber pacemaker-Dr. TaLovena Le. Anxiety   . Syncope     a. H/o recurrent syncope with pauses on loop recorder s/p Medtronic pacemaker 07/2012.  . CKD (chronic kidney disease), stage II   . PAF (paroxysmal atrial fibrillation) (HCS[place]    a.  h/o brief episodes noted on ppm interrogation. b. given GI bleeding and need to be on both ASA 81 and Brilinta, risks likely outweigh benefits of anticoagulation.   . Marland Kitchenysrhythmia     sick sinus syndrome  . Heart murmur   . Hodgkin's disease (HCLyn[place]    hx  . PONV (postoperative nausea and vomiting)     1. last surg Valve  suction caused sore on inner gum rt side, 2 ." be careful with neck tilt have had whiplash few times, 3. Hypotension    Past Surgical History  Procedure Laterality Date  . Total abdominal hysterectomy w/ bilateral salpingoophorectomy  2000  . Breast excisional biopsy      benign,  bilateral  . Nephrectomy  2008    Laparoscopic right nephrectomy, renal cell carcinoma  . Coronary artery bypass graft  2005, 2011    CABG-most recent SVG to RCA-12/05; RCA occluded with patent graft in 2006; Redo bypass in 07/2009  . Tumor excision  1981    removal of Hodgkins lymphoma  . Exploratory laparotomy w/ bowel resection      Small bowel resection of lipoma  . Aortic valve replacement  2011    Aortic valve replacement surgery with a 19 mm bioprosthetic device post redo CABG January 2011.  . Colonoscopy      Multiple with adenomatous polyps  . Cervical biopsy    . Liver biopsy    . Bone marrow biopsy    . Esophagogastroduodenoscopy    . Esophagogastroduodenoscopy  06/23/2012    Procedure: ESOPHAGOGASTRODUODENOSCOPY (EGD);  Surgeon: Gatha Mayer, MD;  Location:  [facility];  Service: Endoscopy;  Laterality: N/A;  . Colonoscopy  06/23/2012    Procedure: COLONOSCOPY;  Surgeon: Gatha Mayer, MD;  Location: [facility]OSCOPY;  Service: Endoscopy;  Laterality: N/A;  [name] dilation  06/23/2012    Procedure: [name] DILATION;  Surgeon: Gatha Mayer, MD;  Location: [facility] ENDOSCOPY;  Service: Endoscopy;;  54 fr  . Pacemaker insertion  08/21/2012    Medtronic Adapta L dual-chamber pacemaker  . Esophagogastroduodenoscopy N/A 04/06/2013    Procedure: ESOPHAGOGASTRODUODENOSCOPY (EGD);  Surgeon: Irene Shipper, MD;  Location: [facility] ENDOSCOPY;  Service: Endoscopy;  Laterality: N/A;  . Givens capsule study N/A 04/30/2013    Procedure: GIVENS CAPSULE STUDY;  Surgeon: Gatha Mayer, MD;  Location: [place];  Service: Endoscopy;  Laterality: N/A;  . Loop recorder implant N/A 12/10/2011    Procedure: LOOP RECORDER IMPLANT;  Surgeon: Evans Lance, MD;  Location: [facility] CATH LAB;  Service: Cardiovascular;  Laterality: N/A;  . Permanent pacemaker insertion N/A 08/21/2012    Procedure: PERMANENT PACEMAKER INSERTION;  Surgeon: Evans Lance, MD;  Location: [facility] CATH LAB;  Service: Cardiovascular;  Laterality: N/A;  . Left and right heart catheterization with coronary angiogram N/A 04/07/2013    Procedure: LEFT AND RIGHT HEART CATHETERIZATION WITH CORONARY ANGIOGRAM;  Surgeon: Larey Dresser, MD;  Location: [facility] CATH LAB;  Service: Cardiovascular;  Laterality: N/A;  . Percutaneous coronary stent intervention (pci-s) N/A 04/09/2013    Procedure: PERCUTANEOUS CORONARY STENT INTERVENTION (PCI-S);  Surgeon: Burnell Blanks, MD;  Location: [facility] CATH LAB;  Service: Cardiovascular;  Laterality: N/A;  . Left heart catheterization with coronary/graft angiogram N/A 06/07/2014    Procedure: LEFT HEART CATHETERIZATION WITH Beatrix Fetters;  Surgeon: Burnell Blanks, MD;  Location: [facility] CATH LAB;  Service: Cardiovascular;  Laterality: N/A;  . Mastectomy Left 05/17/2015    total   .  Total mastectomy Left 05/17/2015    Procedure: LEFT TOTAL MASTECTOMY;  Surgeon: Rolm Bookbinder, MD;  Location: [place];  Service: General;  Laterality: Left;    There were no vitals filed for this visit.  Visit Diagnosis:  Left shoulder pain - Plan: PT plan of care cert/re-cert  Shoulder joint stiffness, left - Plan: PT plan of care cert/re-cert  Abnormal posture - Plan: PT plan of care cert/re-cert  Postoperative edema - Plan: PT plan of care cert/re-cert      Subjective Assessment - 06/10/15 1116    Subjective pain in laft side and fulliness under the incision she is having diffulty with pain control due to allergy to most pain medicine    Pertinent History  Type 1 DM with insulin pump , Hodgkins disease with surgery and radition, kidney caner with removal of right kidney 2008, CABG 2000,cardiac stents 2014,and 2015 and is on blood thinner   hysterectomy 2000 AVR 2008, pacemaker in 2013, liver disease   left mastectomy 05/17/2015 with no lymph nodes removed  drain removed nov7 .  She reports  she has lipodystrophy with removel of lipomas in the past and presence of lipoma at top of spine and possibly at right scapula, alos with history of scolios per patient     Patient Stated Goals to get pain relief   Currently in Pain? Yes   Pain Score 2    Pain Location Chest   Pain Orientation Left   Pain Descriptors / Indicators Aching  intermittent sharp pain with sudden movement    Pain Type Surgical pain   Pain Radiating Towards occasionall to hand    Pain Frequency Constant   Aggravating Factors  sudden movements, huts worse with compression band    Pain Relieving Factors moist heat    Effect of Pain on Daily Activities cant wash haiit            [facility] PT Assessment - 06/10/15 0001    Assessment   Medical Diagnosis breast cancer    Referring Provider Donne Hazel    Onset Date/Surgical Date 05/17/15   Hand Dominance Right   Precautions   Precautions Other (comment)   Precaution  Comments multipe medical issues, cardia, lifting    Restrictions   Weight Bearing Restrictions No   Balance Screen   Has the patient fallen in the past 6 months Yes   How many times? 1  tipped over steps of camper, broken tibia, spained ankles    Has the patient had a decrease in activity level because of a fear of falling?  No   Is the patient reluctant to leave their home because of a fear of falling?  No   Home Ecologist residence   Living Arrangements Spouse/significant other   Prior Function   Level of Independence Needs assistance with ADLs   Vocation On disability  from heart issue    Leisure had been walking   [place], likes to read, involved with church,music    Cognition   Overall Cognitive Status Within Functional Limits for tasks assessed   Observation/Other Assessments   Observations wearing post pink bandeau over left bastectomy site covered with steri strips, with full area below incision medial to  drian site. Insulin pump in right abdomen, pacemaker on left upper chest above masetectomy incision.  Pt moved slowly and carefully    Observation/Other Assessments-Edema    Edema --  observed below incision and at left lateral chest    Posture/Postural Control   Posture/Postural Control Postural limitations   Postural Limitations Rounded Shoulders;Forward head;Increased thoracic kyphosis   Posture Comments protrusionon right scapular ? lipoma ?   AROM   Right Shoulder Flexion 140 Degrees   Right Shoulder ABduction 150 Degrees   Left Shoulder Flexion 68 Degrees   Left Shoulder ABduction 60 Degrees   Left Shoulder External Rotation 20 Degrees   Strength   Overall Strength Comments left shoulder limited by pain to about 2/5              Katina Dung - 06/10/15 0001    Open a tight or new jar Moderate difficulty   Do heavy household chores (wash walls, wash floors) Severe difficulty  Carry a shopping bag or briefcase  Moderate difficulty   Wash your back Moderate difficulty   Use a knife to cut food Moderate difficulty   Recreational activities in which you take some force or impact through your arm, shoulder, or hand (golf, hammering, tennis) Unable   During the past week, to what extent has your arm, shoulder or hand problem interfered with your normal social activities with family, friends, neighbors, or groups? Quite a bit   During the past week, to what extent has your arm, shoulder or hand problem limited your work or other regular daily activities Extremely   Arm, shoulder, or hand pain. Moderate   Tingling (pins and needles) in your arm, shoulder, or hand Mild   Difficulty Sleeping Moderate difficulty   DASH Score 61.36 %             [facility] Adult PT Treatment/Exercise - 06/10/15 0001    Lumbar Exercises: Supine   Other Supine Lumbar Exercises lower trunk rotation in narrow range to try to get skin stretch    Shoulder Exercises: Supine   External Rotation AROM;Left;5 reps   Other Supine Exercises attempted dowel rod flexion but pt had pain    Manual Therapy   Manual Therapy Manual Lymphatic Drainage (MLD)   Manual Lymphatic Drainage (MLD) brief session on superficial and deep abdominals of left side (avoiding insulin pump) diaphragmatic breathing, left inguinal nodes, left lateral trunk , alternating circles to abdomen below trunk, then to sidelying for posterior interaxillary anastamosis and lateral trunk all withing patient tolerance                 PT Education - 06/10/15 1238    Education provided Yes   Education Details narrow range of lower trunk rotation and supine left shoulder external rotation    Person(s) Educated Patient   Methods Explanation;Demonstration   Comprehension Verbalized understanding;Returned demonstration           Short Term Clinic Goals - 06/10/15 1255    CC Short Term Goal  #1   Title pt will reports a decrease in pain by 50% so that she is able  to do household activities with less pain    Time 4   Period Weeks   Status New   CC Short Term Goal  #2   Title Pt will have 90 degrees of left shoudler abduction so that she can dress herself with greater ease    Baseline 60   Time 4   Period Weeks   Status New   CC Short Term Goal  #3   Title Pt will report a decrease in postoperative edema by 50% so she has less discomfort with activites of daily living    Time 4   Period Weeks   Status [facility] Goals - 06/10/15 1257    CC Long Term Goal  #1   Title Pt will have 120 degrees of left shoulder flexion so that she can put dishes up in a cabinet    Baseline 68   Time 8   Period Weeks   Status New   CC Long Term Goal  #2   Title pt will report that she has 75 % less pain so that she can enjoy activities outside of her home    Time 8   Period Weeks   Status New   CC Long Term Goal  #3  Title Pt will be independent in a home exercise program so that she can continue improvement on her own at home    Time 8   Period Weeks   Status New   CC Long Term Goal  #4   Title Pt will decrease Quick DASH score to < 38 demonstrating a improvement in function of left arm    Baseline 61.36   Time 8   Period Weeks   Status New            Plan - 2015/06/12 1240    Clinical Impression Statement 64 yo female with multilpe medical problems who underwent a left mastectomy with no lymph nodes removed 05/17/2015  presents with swelling below incision that she say may be a seroma or hematoma.  MD would like to try manual lymph drainage.  She has has pain that cannot be controlled by medicine due to allergies, decreased range of motion and strength and posture impairment  Anticipate her rehab may be slow due to multiple medical issues and poor toleralbe pain management options    Pt will benefit from skilled therapeutic intervention in order to improve on the following deficits Decreased skin integrity;Pain;Postural  dysfunction;Decreased range of motion;Decreased strength;Impaired UE functional use;Impaired perceived functional ability;Increased edema   Rehab Potential Excellent   Clinical Impairments Affecting Rehab Potential multiple allergies, poor pain control, strong cardiac history, type 1 DM    PT Frequency 3x / week   PT Duration 8 weeks   PT Treatment/Interventions ADLs/Self Care Home Management;Passive range of motion;Patient/family education;Manual lymph drainage;Taping;Therapeutic exercise;DME Instruction;Therapeutic activities;Manual techniques;Moist Heat   PT Next Visit Plan Manual lymph drainage and gentle exercise to left shoulder, begin scapular activation    Consulted and Agree with Plan of Care Patient          G-Codes - 2015-06-12 1238    Functional Assessment Tool Used Quick DASH   Functional Limitation Carrying, moving and handling objects   Carrying, Moving and Handling Objects Current Status (D2202) At least 60 percent but less than 80 percent impaired, limited or restricted   Carrying, Moving and Handling Objects Goal Status (R4270) At least 20 percent but less than 40 percent impaired, limited or restricted       Problem List Patient Active Problem List   Diagnosis Date Noted  . Hx of radiation therapy 04/23/2015  . Thrombocytopenia ([place]) 04/23/2015  . 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 Osteopenia determined by x-ray 04/23/2015  . DM (diabetes mellitus) with complications ([place]) 62/37/6283  . Radiation therapy complication 15/17/6160  . History of nodular sclerosis Hodgkin's disease 10/27/2014  . Iron deficiency anemia due to chronic blood loss 10/27/2014  . Screening mammogram for high-risk patient 10/27/2014  . Insulin dependent type 1 diabetes mellitus ([place]) 10/07/2014  . Claudication ([place]) 07/29/2014  . PAF (paroxysmal atrial fibrillation) ([place])   . CKD (chronic kidney  disease), stage II   . Coronary artery disease involving native coronary artery of native heart with unstable angina pectoris ([place])   . Type 2 diabetes mellitus with complication ([place])   . Abdominal bloating Jun 11, 2013  . Feeling bilious 2013/06/11  . Iron deficiency anemia 04/11/2013  . Unstable angina ([place]) 04/11/2013  . CAD (coronary artery disease) of artery bypass graft 04/03/2013  . Chronic diastolic CHF (congestive heart failure) ([place]) 04/03/2013  . Anemia 04/03/2013  . Arterial vascular disease 03/12/2013  . Aorta disorder ([place]) 03/12/2013  .  Atrial fibrillation ([place]) 03/12/2013  . Artificial cardiac pacemaker 03/12/2013  . CCF (congestive cardiac failure) ([place]) 03/12/2013  . Chronic kidney disease (CKD), stage III (moderate) 03/12/2013  . Gastric reflux 03/12/2013  . 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 Big thyroid 03/12/2013  . H/O Hodgkin's lymphoma 03/12/2013  . BP (high blood pressure) 03/12/2013  . Fitting and adjustment of insulin pump 03/12/2013  . Disorder of bone and cartilage, unspecified 03/12/2013  . Aortic heart valve narrowing 03/12/2013  . Encounter for fitting or adjustment of insulin pump 03/12/2013  . Lipoma of neck 09/23/2012  . Pacemaker 09/08/2012  . Chronic kidney disease, stage 3/cardiorenal syndrome 08/15/2012  . Atrial flutter ([place]) 07/23/2012  . Sick sinus syndrome ([place]) 07/21/2012  . Hyperplastic gastric polyp 06/23/2012  . Gastroparesis due to DM ([place]) 05/01/2012  . Hypercalcemia   . Cholelithiasis 07/20/2011  . Aortic stenosis status post 19 mm Edwards pericardial valve replacement 2011   . Arteriosclerotic cardiovascular disease (ASCVD)   . Osteopenia   . Anemia, normocytic normochromic   . NASH (nonalcoholic steatohepatitis)   . Renal cell carcinoma (HCC)     Class: History of  . Duodenal ulcer     Class: History of  . Ischemic colitis     Class: History of  . Gastroesophageal reflux disease   . Hodgkin's disease   . Hypertension   .  Small bowel obstruction ([place])   . Hyperlipidemia 04/11/2010  . Diabetes mellitus type 2, controlled ([place]) 03/23/2010  . Colonic polyps, adenomatous 09/29/2009   Donato Heinz. Owens Shark, PT  06/10/2015, 1:03 PM  [place], [place], [address] Phone: [phone]   Fax:  [phone]  Name: [name] [name]: [id number] Date of Birth: 1951-01-07

## 2015-06-13 ENCOUNTER — Other Ambulatory Visit: Payer: Medicare Other

## 2015-06-13 ENCOUNTER — Ambulatory Visit: Payer: Medicare Other | Admitting: Oncology

## 2015-06-13 ENCOUNTER — Ambulatory Visit: Payer: Medicare Other | Admitting: Physical Therapy

## 2015-06-13 DIAGNOSIS — R609 Edema, unspecified: Secondary | ICD-10-CM

## 2015-06-13 DIAGNOSIS — M25612 Stiffness of left shoulder, not elsewhere classified: Secondary | ICD-10-CM

## 2015-06-13 DIAGNOSIS — R293 Abnormal posture: Secondary | ICD-10-CM

## 2015-06-13 DIAGNOSIS — M25512 Pain in left shoulder: Secondary | ICD-10-CM

## 2015-06-13 NOTE — Therapy (Signed)
[place], [place], [address] Phone: [phone]   Fax:  [phone]  Physical Therapy Treatment  Patient Details  Name: [name] [name] MRN: [id number] Date of Birth: 18-Dec-1950 Referring Provider: Donne Hazel   Encounter Date: 06/13/2015      PT End of Session - 06/13/15 1749    Visit Number 2   Number of Visits 24   Date for PT Re-Evaluation 08/10/15   PT Start Time 1430   PT Stop Time 1515   PT Time Calculation (min) 45 min   Activity Tolerance Patient limited by pain   Behavior During Therapy [facility] for tasks assessed/performed      Past Medical History  Diagnosis Date  . Aortic stenosis     a. bicuspid aortic valve; mean gradient of 20 mmHg in 2/10; b. s/p bioprosth AVR (19-mm Edwards pericardial valve) with a redo coronary artery bypass graft procedure in 07/2009; postoperative Dressler's syndrome    . Coronary artery disease     a. initial CABG with SVG-RCA in 12/05. b. redo SVG-RCA and bioprosthetic AVR in 1/11. d. 9/14 - PCI with DES to distal LM/proximal LAD was done rather than bypass as she would have been a poor candidate for redo sternotomy. d. S/p DES to prox LM 05/2014.  Marland Kitchen Chronic diastolic CHF (congestive heart failure) ([place])     a. 9.2014 EchoP EF 60-65%, no rwma, bioprosth AVR, mean gradient of 69mHg, mild to mod MR, PASP 546mg.  . Marland Kitchenyperlipidemia   . Hypertension   . Small bowel obstruction (HCC)     Recurrent; resolved after resection of a lipoma  . Osteopenia      hip on DEXA in October 2007.  . Marland Kitchennemia   . Carcinoma, renal cell ([facility]CR[place]11/2008    Laparoscopic right nephrectomy  . NASH (nonalcoholic steatohepatitis) 1999    -biopsy in 1999  . Diabetes mellitus 03/2010     TYPE II: Hemoglobin A1c of 7.4 in  03/2010; 8.4 in 06/2010; treated with insulin pump  . Migraines   . Duodenal ulcer     Remote; H. pylori positive  . Colitis, ischemic (HC[place]ile[place]2008  . Gastroesophageal  reflux disease     ? of GASTROPARESIS; IRRITABLE BOWEL SYNDROME  . Hodgkin's disease (HCBlodgett Land[place]1991    [place] radiation therapy  . Statin intolerance   . Hypercalcemia     Ca-11.1 in 07/2011  . Vitamin D deficiency   . Costochondritis   . Insomnia   . Iron deficiency anemia     a. 03/2013 EGD: essentially normal. Possible slow GIB.  . Marland Kitchenastroparesis due to DM (HCStella10/04/2012  . Hyperplastic gastric polyp 06/23/2012  . Arthritis   . Osteoporosis   . Pacemaker 08/21/2012    Medtronic Adapta L dual-chamber pacemaker-Dr. TaLovena Le. Anxiety   . Syncope     a. H/o recurrent syncope with pauses on loop recorder s/p Medtronic pacemaker 07/2012.  . CKD (chronic kidney disease), stage II   . PAF (paroxysmal atrial fibrillation) (HCWilliams    a.  h/o brief episodes noted on ppm interrogation. b. given GI bleeding and need to be on both ASA 81 and Brilinta, risks likely outweigh benefits of anticoagulation.   . Marland Kitchenysrhythmia     sick sinus syndrome  . Heart murmur   . Hodgkin's disease (HCJetmore    hx  . PONV (postoperative nausea and vomiting)     1. last surg Valve  suction caused sore on inner  gum rt side, 2 ." be careful with neck tilt have had whiplash few times, 3. Hypotension    Past Surgical History  Procedure Laterality Date  . Total abdominal hysterectomy w/ bilateral salpingoophorectomy  2000  . Breast excisional biopsy      benign, bilateral  . Nephrectomy  2008    Laparoscopic right nephrectomy, renal cell carcinoma  . Coronary artery bypass graft  2005, 2011    CABG-most recent SVG to RCA-12/05; RCA occluded with patent graft in 2006; Redo bypass in 07/2009  . Tumor excision  1981    removal of Hodgkins lymphoma  . Exploratory laparotomy w/ bowel resection      Small bowel resection of lipoma  . Aortic valve replacement  2011    Aortic valve replacement surgery with a 19 mm bioprosthetic device post redo CABG January 2011.  . Colonoscopy      Multiple with adenomatous polyps  .  Cervical biopsy    . Liver biopsy    . Bone marrow biopsy    . Esophagogastroduodenoscopy    . Esophagogastroduodenoscopy  06/23/2012    Procedure: ESOPHAGOGASTRODUODENOSCOPY (EGD);  Surgeon: Gatha Mayer, MD;  Location: [facility];  Service: Endoscopy;  Laterality: N/A;  . Colonoscopy  06/23/2012    Procedure: COLONOSCOPY;  Surgeon: Gatha Mayer, MD;  Location: [facility] ENDOSCOPY;  Service: Endoscopy;  Laterality: N/A;  [name] dilation  06/23/2012    Procedure: [name] DILATION;  Surgeon: Gatha Mayer, MD;  Location: [facility] ENDOSCOPY;  Service: Endoscopy;;  54 fr  . Pacemaker insertion  08/21/2012    Medtronic Adapta L dual-chamber pacemaker  . Esophagogastroduodenoscopy N/A 04/06/2013    Procedure: ESOPHAGOGASTRODUODENOSCOPY (EGD);  Surgeon: Irene Shipper, MD;  Location: [facility] ENDOSCOPY;  Service: Endoscopy;  Laterality: N/A;  . Givens capsule study N/A 04/30/2013    Procedure: GIVENS CAPSULE STUDY;  Surgeon: Gatha Mayer, MD;  Location: [place];  Service: Endoscopy;  Laterality: N/A;  . Loop recorder implant N/A 12/10/2011    Procedure: LOOP RECORDER IMPLANT;  Surgeon: Evans Lance, MD;  Location: [facility] CATH LAB;  Service: Cardiovascular;  Laterality: N/A;  . Permanent pacemaker insertion N/A 08/21/2012    Procedure: PERMANENT PACEMAKER INSERTION;  Surgeon: Evans Lance, MD;  Location: [facility] C[facility] LAB;  Service: Cardiovascular;  Laterality: N/A;  . Left and right heart catheterization with coronary angiogram N/A 04/07/2013    Procedure: LEFT AND RIGHT HEART CATHETERIZATION WITH CORONARY ANGIOGRAM;  Surgeon: Larey Dresser, MD;  Location: [facility] CATH LAB;  Service: Cardiovascular;  Laterality: N/A;  . Percutaneous coronary stent intervention (pci-s) N/A 04/09/2013    Procedure: PERCUTANEOUS CORONARY STENT INTERVENTION (PCI-S);  Surgeon: Burnell Blanks, MD;  Location: [facility] CATH LAB;  Service: Cardiovascular;  Laterality: N/A;  . Left heart catheterization with coronary/graft angiogram N/A  06/07/2014    Procedure: LEFT HEART CATHETERIZATION WITH Beatrix Fetters;  Surgeon: Burnell Blanks, MD;  Location: [facility] CATH LAB;  Service: Cardiovascular;  Laterality: N/A;  . Mastectomy Left 05/17/2015    total   . Total mastectomy Left 05/17/2015    Procedure: LEFT TOTAL MASTECTOMY;  Surgeon: Rolm Bookbinder, MD;  Location: [place];  Service: General;  Laterality: Left;    There were no vitals filed for this visit.  Visit Diagnosis:  Left shoulder pain  Shoulder joint stiffness, left  Abnormal posture  Postoperative edema      Subjective Assessment - 06/13/15 1437    Subjective pt state she got some relief from the  manual lymph drainage. "It very much feels like it took the edge off"    Pertinent History Type 1 DM with insulin pump , Hodgkins disease with surgery and radition, kidney caner with removal of right kidney 2008, CABG 2000,cardiac stents 2014,and 2015 and is on blood thinner   hysterectomy 2000 AVR 2008, pacemaker in 2013, liver disease   left mastectomy 05/17/2015 with no lymph nodes removed  drain removed nov7 .  She reports  she has lipodystrophy with removel of lipomas in the past and presence of lipoma at top of spine and possibly at right scapula, alos with history of scolios per patient     Patient Stated Goals to get pain relief   Currently in Pain? Yes   Pain Score 2                          [facility] Adult PT Treatment/Exercise - 06/13/15 0001    Neck Exercises: Seated   Other Seated Exercise neck range of motion in all planes    Shoulder Exercises: Supine   Other Supine Exercises biceps and triceps activation   Shoulder Exercises: Sidelying   External Rotation AROM;Left;5 reps   ABduction AROM   Other Sidelying Exercises small circles in each direction with support for arm    Manual Therapy   Manual Lymphatic Drainage (MLD) short neck on right side, left inguinal, left abdomen and left axillo-inguinal anastamosis, then to  sidelying for left lateral chest, pack and posterior interaxillary anastamosis with instruction to husband in this technique                   Short Term Clinic Goals - 06/10/15 1255    CC Short Term Goal  #1   Title pt will reports a decrease in pain by 50% so that she is able to do household activities with less pain    Time 4   Period Weeks   Status New   CC Short Term Goal  #2   Title Pt will have 90 degrees of left shoudler abduction so that she can dress herself with greater ease    Baseline 60   Time 4   Period Weeks   Status New   CC Short Term Goal  #3   Title Pt will report a decrease in postoperative edema by 50% so she has less discomfort with activites of daily living    Time 4   Period [facility] Goals - 06/10/15 1257    CC Long Term Goal  #1   Title Pt will have 120 degrees of left shoulder flexion so that she can put dishes up in a cabinet    Baseline 68   Time 8   Period Weeks   Status New   CC Long Term Goal  #2   Title pt will report that she has 75 % less pain so that she can enjoy activities outside of her home    Time 8   Period Weeks   Status New   CC Long Term Goal  #3   Title Pt will be independent in a home exercise program so that she can continue improvement on her own at home    Time 8   Period Weeks   Status New   CC Long Term Goal  #4   Title Pt will decrease Quick  DASH score to < 38 demonstrating a improvement in function of left arm    Baseline 61.36   Time 8   Period Weeks   Status New            Plan - 06/13/15 1750    Clinical Impression Statement Pt reports she has improvement after last session and is able to begin to use her arm more.  She was happy to have husband learn how to do manual lymph drainage to her back and side that she cannot reach..  She did wel with UE ranger to begin acive moment of left shoulder and scapula wtihout  upper trap activation.    Pt will  benefit from skilled therapeutic intervention in order to improve on the following deficits Decreased skin integrity;Pain;Postural dysfunction;Decreased range of motion;Decreased strength;Impaired UE functional use;Impaired perceived functional ability;Increased edema   Rehab Potential Excellent   Clinical Impairments Affecting Rehab Potential multiple allergies, poor pain control, strong cardiac history, type 1 DM    PT Next Visit Plan Manual lymph drainage and gentle exercise to left shoulder,wiih UE ranger and dowel  begin scapular activation         Problem List Patient Active Problem List   Diagnosis Date Noted  . Hx of radiation therapy 04/23/2015  . Thrombocytopenia ([place]) 04/23/2015  . 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 Osteopenia determined by x-ray 04/23/2015  . DM (diabetes mellitus) with complications ([place]) 65/09/5463  . Radiation therapy complication 68/06/7516  . History of nodular sclerosis Hodgkin's disease 10/27/2014  . Iron deficiency anemia due to chronic blood loss 10/27/2014  . Screening mammogram for high-risk patient 10/27/2014  . Insulin dependent type 1 diabetes mellitus ([place]) 10/07/2014  . Claudication ([place]) 07/29/2014  . PAF (paroxysmal atrial fibrillation) ([place])   . CKD (chronic kidney disease), stage II   . Coronary artery disease involving native coronary artery of native heart with unstable angina pectoris ([place])   . Type 2 diabetes mellitus with complication ([place])   . Abdominal bloating 06/09/2013  . Feeling bilious 06/09/2013  . Iron deficiency anemia 04/11/2013  . Unstable angina ([place]) 04/11/2013  . CAD (coronary artery disease) of artery bypass graft 04/03/2013  . Chronic diastolic CHF (congestive heart failure) ([place]) 04/03/2013  . Anemia 04/03/2013  . Arterial vascular disease 03/12/2013  . Aorta disorder ([place]) 03/12/2013  . Atrial fibrillation ([place])  03/12/2013  . Artificial cardiac pacemaker 03/12/2013  . CCF (congestive cardiac failure) ([place]) 03/12/2013  . Chronic kidney disease (CKD), stage III (moderate) 03/12/2013  . Gastric reflux 03/12/2013  . 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 Big thyroid 03/12/2013  . H/O Hodgkin's lymphoma 03/12/2013  . BP (high blood pressure) 03/12/2013  . Fitting and adjustment of insulin pump 03/12/2013  . Disorder of bone and cartilage, unspecified 03/12/2013  . Aortic heart valve narrowing 03/12/2013  . Encounter for fitting or adjustment of insulin pump 03/12/2013  . Lipoma of neck 09/23/2012  . Pacemaker 09/08/2012  . Chronic kidney disease, stage 3/cardiorenal syndrome 08/15/2012  . Atrial flutter ([place]) 07/23/2012  . Sick sinus syndrome ([place]) 07/21/2012  . Hyperplastic gastric polyp 06/23/2012  . Gastroparesis due to DM ([place]) 05/01/2012  . Hypercalcemia   . Cholelithiasis 07/20/2011  . Aortic stenosis status post 19 mm Edwards pericardial valve replacement 2011   . Arteriosclerotic cardiovascular disease (ASCVD)   . Osteopenia   . Anemia, normocytic normochromic   . NASH (nonalcoholic steatohepatitis)   .  Renal cell carcinoma (HCC)     Class: History of  . Duodenal ulcer     Class: History of  . Ischemic colitis     Class: History of  . Gastroesophageal reflux disease   . Hodgkin's disease   . Hypertension   . Small bowel obstruction ([place])   . Hyperlipidemia 04/11/2010  . Diabetes mellitus type 2, controlled ([place]) 03/23/2010  . Colonic polyps, adenomatous 09/29/2009   Donato Heinz. Owens Shark, PT  06/13/2015, 5:55 PM  [place], [place], [address] Phone: [phone]   Fax:  [phone]  Name: FERMINA MISHKIN MRN: [id number] Date of Birth: Oct 17, 1950

## 2015-06-14 ENCOUNTER — Ambulatory Visit: Payer: Medicare Other

## 2015-06-14 ENCOUNTER — Telehealth: Payer: Self-pay | Admitting: Oncology

## 2015-06-14 DIAGNOSIS — R293 Abnormal posture: Secondary | ICD-10-CM

## 2015-06-14 DIAGNOSIS — M25512 Pain in left shoulder: Secondary | ICD-10-CM | POA: Diagnosis not present

## 2015-06-14 DIAGNOSIS — R609 Edema, unspecified: Secondary | ICD-10-CM

## 2015-06-14 DIAGNOSIS — M25612 Stiffness of left shoulder, not elsewhere classified: Secondary | ICD-10-CM

## 2015-06-14 NOTE — Telephone Encounter (Signed)
Lm regarding future appt on 10/13/15
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
## 2015-06-15 ENCOUNTER — Encounter: Payer: Self-pay | Admitting: *Deleted

## 2015-06-19 NOTE — Telephone Encounter (Signed)
Go to ER if you cannot breath, have severe chest pressure or pass out. GT

## 2015-06-21 NOTE — Telephone Encounter (Signed)
Informed patient of Dr.Taylor's recommendations. Patient also asked if there was any time where she could send a transmission outside of the scheduled remote checks. I explained to her that she could send a transmission during our normal business hours if she was having sx's and wanted it to be reviewed. I reiterated to he that if she has the sx's Dr.Taylor mentioned then she needs to go to ER. Patient voiced understanding.

## 2015-06-22 ENCOUNTER — Encounter: Payer: Self-pay | Admitting: *Deleted

## 2015-06-22 ENCOUNTER — Ambulatory Visit (INDEPENDENT_AMBULATORY_CARE_PROVIDER_SITE_OTHER): Payer: Medicare Other | Admitting: Cardiology

## 2015-06-22 ENCOUNTER — Ambulatory Visit: Payer: Medicare Other

## 2015-06-22 ENCOUNTER — Encounter: Payer: Self-pay | Admitting: Cardiology

## 2015-06-22 VITALS — BP 130/62 | HR 76 | Ht 62.5 in | Wt 125.0 lb

## 2015-06-22 DIAGNOSIS — M25612 Stiffness of left shoulder, not elsewhere classified: Secondary | ICD-10-CM

## 2015-06-22 DIAGNOSIS — R293 Abnormal posture: Secondary | ICD-10-CM

## 2015-06-22 DIAGNOSIS — M25512 Pain in left shoulder: Secondary | ICD-10-CM | POA: Diagnosis not present

## 2015-06-22 DIAGNOSIS — I5032 Chronic diastolic (congestive) heart failure: Secondary | ICD-10-CM | POA: Diagnosis not present

## 2015-06-22 DIAGNOSIS — I480 Paroxysmal atrial fibrillation: Secondary | ICD-10-CM

## 2015-06-22 DIAGNOSIS — I6523 Occlusion and stenosis of bilateral carotid arteries: Secondary | ICD-10-CM

## 2015-06-22 DIAGNOSIS — R609 Edema, unspecified: Secondary | ICD-10-CM

## 2015-06-22 DIAGNOSIS — I25110 Atherosclerotic heart disease of native coronary artery with unstable angina pectoris: Secondary | ICD-10-CM

## 2015-06-22 MED ORDER — TICAGRELOR 60 MG PO TABS: 60.0000 mg | ORAL_TABLET | Freq: Two times a day (BID) | ORAL | Status: DC
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
## 2015-06-22 NOTE — Patient Instructions (Signed)
Medication Instructions:  Decrease Brilinta to 34m two times a day  Labwork: None today  Testing/Procedures: Your physician has requested that you have a carotid duplex. This test is an ultrasound of the carotid arteries in your neck. It looks at blood flow through these arteries that supply the brain with blood. Allow one hour for this exam. There are no restrictions or special instructions. MARCH 2017    Follow-Up: Your physician recommends that you schedule a follow-up appointment in: 4 months with Dr MAundra Dubin         If you need a refill on your cardiac medications before your next appointment, please call your pharmacy

## 2015-06-24 ENCOUNTER — Ambulatory Visit: Payer: Medicare Other | Attending: General Surgery | Admitting: Physical Therapy

## 2015-06-24 DIAGNOSIS — R293 Abnormal posture: Secondary | ICD-10-CM | POA: Insufficient documentation

## 2015-06-24 DIAGNOSIS — M25612 Stiffness of left shoulder, not elsewhere classified: Secondary | ICD-10-CM | POA: Diagnosis present

## 2015-06-24 DIAGNOSIS — M25512 Pain in left shoulder: Secondary | ICD-10-CM | POA: Diagnosis not present

## 2015-06-24 DIAGNOSIS — R609 Edema, unspecified: Secondary | ICD-10-CM | POA: Insufficient documentation

## 2015-06-24 NOTE — Progress Notes (Signed)
Patient ID: [name] [name], female   DOB: 01-25-1951, 64 y.o.   MRN: [id number] PCP: Dr. Hilma Favors Oncologist: Dr Marko Plume  64 yo with history of CAD s/p CABG in 12/05 and redo in 1/11, bioprosthetic AVR, diastolic CHF, and sick sinus syndrome s/p PCM presents for cardiology followup.  She had initial CABG with SVG-RCA in 12/05 followed by redo SVG-RCA and bioprosthetic AVR in 1/11.  She has had diastolic CHF and is on Lasix.  In 9/14, she had a CPX test.  This showed severe functional limitation with ischemic ECG changes (inferolateral ST depression and ST elevation in V1 and V2). She had chest pain.  I took her for [facility] in 9/14.  This showed elevated left and right heart filling pressures, patent SVG-PDA, and 80% distal LM/80% ostial LAD stenosis.  Patient was deemed not a candidate for redo CABG (had 2 prior sternotomies as well as chest radiation for Hodgkins lymphoma).  She had DES from left main into proximal LAD and will need long-term DAPT => Brilinta was used as she has been a poor responder to Plavix in the past.  She re-developed angina in 11/15 and was admitted with unstable angina.  LHC showed 80% ostial LM and patent SVG-RCA. She had DES to ostial LM. Last echo in 11/15 showed EF 55-60%, normal bioprosthetic aortic valve, mild-moderate MR, moderate TR.  Since that time, she has had no more chest pain.  She gets short of breath with heavier exertion likely walking up a hill or playing with her puppy. No orthopnea/PND.    She has chronic iron deficiency anemia thought to be due to a slow GI bleed.  Last EGD was unremarkable and a capsule endoscopy was also unremarkable.  She has had periodic transfusions; However, recently hemoglobin has been stable.  No BRBPR.   She had bilateral mastectomy for DCIS in 40/98 without complication.  She has an insulin pump followed by an endocrinologist at [facility].   Given myalgias with statins, she was started on Praluent.  LDL is now excellent.    She has been  having symptomatic runs of atrial fibrillation that last for a few minutes.  These occur 1-2 times/week.  She did have one episode in 11/16 where her heart continued to pound for > 1 hour so she came to the ER.  Atrial fibrillation resolved before she got to the ER but was noted on ICD interrogation.   Labs (7/14): hemoglobin 11 Labs (9/14): K 3.9, creatinine 1.48, hemoglobin 7.3 Labs (10/14): hemoglobin 7.7 Labs (11/14): K 3.8, creatinine 1.5 Labs (12/14): HCT 37.7 Labs (1/15): LDL 106, HDL 41, K 3.9, creatinine 1.6, LFTs normal Labs (2/15): K 3.7, creatinine 1.1, BNP 71 Labs (3/15): HCT 36.7 Labs (4/15): K 3.7, creatinine 1.1, HCT 35.5 Labs (7/15): HCT 36.4 Labs (11/15): HCT 32.1, K 4.2, creatinine 1.06 Labs (12/15): hgb 12 Labs (1/16): HCT 37.1 Labs (3/16): LDL 25, HDL 42, elevated transaminases Labs (6/16): HCT 39.6, plts 146 Labs (7/16): LDL 59, HDL 41, AST 34, ALT 49, AP 125 Labs (11/16): HCT 38.8, alkaline phosphatase 156, AST/ALT normal, K 4, creatinine 1.0  ECG: NSR, 1st degree AV block 360 msec  PMH: 1. Sick sinus syndrome s/p [employer] PCM.  2. HTN 3. H/o SBO 4. Renal cell carcinoma s/p right nephrectomy in 2008.  5. 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  Fibroscan in 11/14 showed advanced fibrosis concerning for cirrhosis. EGD in 11/14 showed no varices.  6. Diastolic CHF: Echo (1/19) with EF 60-65%, bioprosthetic  aortic valve with mean gradient 15 mmHg, mild MR.  CPX (9/14): peak VO2 10.4, VE/VCO2 43.8, inferolateral ST depression and ST elevation in V1/V2 with severe dyspnea and chest pain, severe functional limitation with ischemic changes.  Echo (9/14) with EF 60-65%, normal RV size and systolic function, mild-moderate MR, bioprosthetic aortic valve normal, PA systolic pressure 51 mmHg, no evidence for pericardial constriction though exam incomplete for constriction.  Echo (11/15) with EF 55-60%, bioprosthetic aortic valve, mild-moderate MR, moderate TR, PA systolic  pressure 42 mmHg.  7. TAH-BSO 8. Type II diabetes: Has insulin pump.  9. H/o ischemic colitis. 10. H/o PUD. 11. Diabetic gastroparesis. 12. Hyperlipidemia: Myalgias with Crestor and atorvastatin, elevated LFTs with simvastatin.  Myalgias with > 10 mg pravastatin.  13. CKD 14. Bicuspid aortic valve s/p 19 mm Edwards pericardial valve in 1/11 (had post-operative Dresslers syndrome).   15. CAD: CABG with SVG-RCA in 12/05.  Redo SVG-RCA in 1/11 with AVR.  LHC/RHC (9/14) with mean RA 18, PA 62/25 mean 43, mean PCWP 26, CI 2.8, 70-80% distal LM stenosis, 80% ostial LAD stenosis, total occlusion RCA, SVG-PDA patent.  Patient had DES LM into LAD.  Plan to continue long-term ASA/Brilinta (poor responder to Plavix).  ETT-cardiolite (4/15) with 6' exercise, EF 60%, ST depression in recovery and mild chest pain, no ischemia or infarction by perfusion images.  Unstable angina 11/15 with 80% LM, patent SVG-RCA => DES to ostial LM.   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  17. Anemia: Iron-deficiency.  Suspect chronic GI blood loss.  EGD in 9/14 was unremarkable.  Capsule endoscopy 10/14 was unremarkable.  Has required periodic blood transfusions.  18. Atrial fibrillation: Paroxysmal, noted by [facility] interrogation.  19. Carotid stenosis: Carotid dopplers (2/15) with 40-59% bilateral stenosis. Carotid dopplers (3/16) with 40-59% bilateral ICA stenosis.  20. 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   SH: Married, lives in [place], nonsmoker.   FH: CAD  ROS: All systems reviewed and negative except as per HPI.   Current Outpatient Prescriptions  Medication Sig Dispense Refill  . acyclovir cream (ZOVIRAX) 5 % 1 application as needed for cold sores    . Alirocumab 75 MG/ML SOPN Inject 75 mg into the skin as directed. Every 2 weeks on Tuesday.  Will  be taking Monday 10/24 prior to admission for surgery    . ALPRAZolam (XANAX) 0.5 MG tablet Take 0.5 mg by mouth at bedtime. May take additional dose daily for anxiety    . APIDRA 100 UNIT/ML injection INFUSE THROUGH INSULIN PUMP UTD. Has been receiving 82-125 units daily over last 10 days.  Asked pt to bring with her on admission 10/25  2  . aspirin EC 81 MG tablet Take 81 mg by mouth at bedtime.    Marland Kitchen azithromycin (ZITHROMAX) 500 MG tablet 1 tablet by mouth one hour prior to dental appointment/procedure (Patient taking differently: Take 500 mg by mouth See admin instructions. 1 tablet by mouth one hour prior to dental appointment/procedure) 2 tablet 0  . Calcium Carbonate-Vitamin D (RA CALCIUM PLUS VITAMIN D) 600-400 MG-UNIT per tablet Take 1 tablet by mouth 2 (two) times daily.     . Cholecalciferol 2000 UNITS TABS Take 1 tablet by mouth daily.    Marland Kitchen docusate sodium (COLACE) 100 MG capsule Take 1 capsule (100 mg total) by mouth 2 (two) times daily. (Patient taking differently: Take 100  mg by mouth daily. Will take 2nd dose at bedtime if constipated) 60 capsule 3  . ferrous fumarate (HEMOCYTE - 106 MG FE) 325 (106 FE) MG TABS tablet Take 1 tablet (106 mg of iron total) by mouth 2 (two) times daily. Or as directed. Take with Vitamin -C (Patient taking differently: Take 1 tablet by mouth 2 (two) times daily. ) 100 each 2  . furosemide (LASIX) 40 MG tablet Take 40 mg by mouth daily as needed for fluid (if weight increases >= 2 lb).    . furosemide (LASIX) 80 MG tablet Take 1 tablet (80 mg total) by mouth daily. 90 tablet 3  . glucagon (GLUCAGON EMERGENCY) 1 MG injection Inject 1 mg into the vein once as needed (for severe reaction).    . isosorbide mononitrate (IMDUR) 60 MG 24 hr tablet Take 1 tablet (60 mg total) by mouth daily. (Patient taking differently: Take 60 mg by mouth at bedtime. ) 90 tablet 3  . Magnesium Oxide (MAG-OXIDE PO) Take 250 mg by mouth daily.    . metoprolol (LOPRESSOR) 50 MG tablet  Take 1.5 tablets (75 mg total) by mouth 2 (two) times daily. 270 tablet 6  . Multiple Vitamins-Minerals (MULTIVITAMIN WITH MINERALS) tablet Take 1 tablet by mouth daily.      . nitroGLYCERIN (NITROLINGUAL) 0.4 MG/SPRAY spray Place 1 spray under the tongue every 5 (five) minutes as needed for chest pain. (Patient taking differently: Place 1 spray under the tongue every 5 (five) minutes as needed for chest pain. Has not needed recently) 12 g 1  . omeprazole (PRILOSEC) 20 MG capsule Take 20 mg by mouth daily.    . ONE TOUCH ULTRA TEST test strip 1 each by Other route as needed for other.     . potassium chloride SA (K-DUR,KLOR-CON) 20 MEQ tablet Take 40 mEq by mouth every morning.    . pravastatin (PRAVACHOL) 10 MG tablet TAKE ONE (1) TABLET BY MOUTH EVERY DAY 90 tablet 3  . valACYclovir (VALTREX) 1000 MG tablet Take 1,000 mg by mouth 3 (three) times daily. X 7 days for cold sores    . vitamin C (ASCORBIC ACID) 250 MG tablet Take 250 mg by mouth 2 (two) times daily.    . Wheat Dextrin (BENEFIBER PO) Take 45 mLs by mouth every morning. 3 tablespoons each morning    . clobetasol (TEMOVATE) 0.05 % external solution Apply 1 application topically daily as needed (for hives).     Marland Kitchen desoximetasone (TOPICORT) 0.05 % cream Apply 1 application topically 2 (two) times daily as needed. When skin starts peeling    . diclofenac sodium (VOLTAREN) 1 % GEL Apply topically as needed (for body aches).     . hydrocortisone (ANUSOL-HC) 25 MG suppository Place 25 mg rectally 2 (two) times daily.    . ticagrelor (BRILINTA) 60 MG TABS tablet Take 1 tablet (60 mg total) by mouth 2 (two) times daily. 180 tablet 3   No current facility-administered medications for this visit.    BP 130/62 mmHg  Pulse 76  Ht 5' 2.5" (1.588 m)  Wt 125 lb (56.7 kg)  BMI 22.48 kg/m2 General: NAD Neck: JVP 7 cm, no thyromegaly or thyroid nodule.  Lungs: Clear to auscultation bilaterally with normal respiratory effort. CV: Nondisplaced PMI.   Heart regular S1/S2, no S3/S4, 1/6 early systolic murmur RUSB.  No peripheral edema.  Left carotid bruit.  2+ PT pulses bilaterally.  Abdomen: Soft, nontender, no hepatosplenomegaly, no distention.  Skin: Intact without lesions or  rashes.  Neurologic: Alert and oriented x 3.  Psych: Normal affect. Extremities: No clubbing or cyanosis.   Assessment/Plan: 1. CAD: s/p SVG-RCA in 12/05, then redo SVG-RCA in 1/11.  She was found to have distal left main/proximal LAD severe stenosis in 9/14.  She was very symptomatic.  PCI with DES to distal LM/proximal LAD was done rather than bypass as she would have been a poor candidate for redo sternotomy (2 prior sternotomies and history of chest wall radiation for Hodgkins disease).  Recurrent unstable angina with 80% ostial LM treated with DES in 11/15.  No chest pain since that time.  She will need to be on Brilinta and ASA (poor responder to plavix) long-term.  This is complicated by suspected slow GI bleed. Hemoglobin stable recently.  - Decrease Brilinta to 60 mg bid.  - Continue ASA 81, statin, metoprolol, Imdur without changes.    2. Bioprosthetic aortic valve replacement: Stable on last echo.  3. Diastolic CHF: Euvolemic on exam, EF normal on 11/15 echo. Continue current Lasix. 4. CKD:  Patient has a single kidney s/p right nephrectomy.  She sees nephrology. Creatinine has been stable. 5. Hyperlipidemia: She has not been able to tolerate any higher potency statin than pravastatin 10 mg daily.  She is now on Praluent with excellent LDL.     6. Anemia: Suspected slow GI bleed.  Unremarkable EGD and capsule endoscopy in fall 2014.  Unfortunately, needs to stay on Brilinta long-term.  Will need to follow closely (sees hematology), plan for transfusion with hemoglobin < 8 as this allows best control of her symptoms. Most recent hemoglobin this month was stable.   7. Atrial fibrillation: Paroxysmal, brief episodes noted on PCM interrogation.  Given GI bleeding and  need to be on both ASA 81 and Brilinta, risks likely outweigh benefits of anticoagulation.  Watchman device would be a potential option for her but need to research if she had LA appendage ligation with either of her cardiac surgeries.  She has been having about 1-2 episodes a week.  Usually they do not last long but she had a prolonged episode earlier this month.  She is not a good candidate for Ic agents.  She is not a good Multaq candidate with CHF.  If she needs an antiarrhythmic, consider Tikosyn or Sotalol (though long 1st degree AVB makes these less appealing).  8. Carotid bruit: Mild to moderate bilateral stenosis.  Repeat in 3/17. 9. SSS: Pacemaker in place.    [name] 06/24/2015

## 2015-06-24 NOTE — Therapy (Signed)
[place], [place], [address] Phone: [phone]   Fax:  [phone]  Physical Therapy Treatment  Patient Details  Name: [name] [name] MRN: [id number] Date of Birth: August 21, 1950 Referring Provider: Donne Hazel   Encounter Date: 06/24/2015      PT End of Session - 06/24/15 1225    Visit Number 5   Number of Visits 24   Date for PT Re-Evaluation 08/10/15   PT Start Time 1020   PT Stop Time 1107   PT Time Calculation (min) 47 min   Activity Tolerance Patient tolerated treatment well   Behavior During Therapy [facility] for tasks assessed/performed      Past Medical History  Diagnosis Date  . Aortic stenosis     a. bicuspid aortic valve; mean gradient of 20 mmHg in 2/10; b. s/p bioprosth AVR (19-mm Edwards pericardial valve) with a redo coronary artery bypass graft procedure in 07/2009; postoperative Dressler's syndrome    . Coronary artery disease     a. initial CABG with SVG-RCA in 12/05. b. redo SVG-RCA and bioprosthetic AVR in 1/11. d. 9/14 - PCI with DES to distal LM/proximal LAD was done rather than bypass as she would have been a poor candidate for redo sternotomy. d. S/p DES to prox LM 05/2014.  Marland Kitchen Chronic diastolic CHF (congestive heart failure) ([place])     a. 9.2014 EchoP EF 60-65%, no rwma, bioprosth AVR, mean gradient of 85mHg, mild to mod MR, PASP 579mg.  . Marland Kitchenyperlipidemia   . Hypertension   . Small bowel obstruction (HCC)     Recurrent; resolved after resection of a lipoma  . Osteopenia      hip on DEXA in October 2007.  . Marland Kitchennemia   . Carcinoma, renal cell (HCLa [place]11/2008    Laparoscopic right nephrectomy  . NASH (nonalcoholic steatohepatitis) 1999    -biopsy in 1999  . Diabetes mellitus 03/2010     TYPE II: Hemoglobin A1c of 7.4 in  03/2010; 8.4 in 06/2010; treated with insulin pump  . Migraines   . Duodenal ulcer     Remote; H. pylori positive  . Colitis, ischemic (HCClyde2008  .  Gastroesophageal reflux disease     ? of GASTROPARESIS; IRRITABLE BOWEL SYNDROME  . Hodgkin's disease (HCHahira1991    [place] radiation therapy  . Statin intolerance   . Hypercalcemia     Ca-11.1 in 07/2011  . Vitamin D deficiency   . Costochondritis   . Insomnia   . Iron deficiency anemia     a. 03/2013 EGD: essentially normal. Possible slow GIB.  . Marland Kitchenastroparesis due to DM (HCMojave10/04/2012  . Hyperplastic gastric polyp 06/23/2012  . Arthritis   . Osteoporosis   . Pacemaker 08/21/2012    Medtronic Adapta L dual-chamber pacemaker-Dr. TaLovena Le. Anxiety   . Syncope     a. H/o recurrent syncope with pauses on loop recorder s/p Medtronic pacemaker 07/2012.  . CKD (chronic kidney disease), stage II   . PAF (paroxysmal atrial fibrillation) ([place]    a.  h/o brief episodes noted on ppm interrogation. b. given GI bleeding and need to be on both ASA 81 and Brilinta, risks likely outweigh benefits of anticoagulation.   . Marland Kitchenysrhythmia     sick sinus syndrome  . Heart murmur   . Hodgkin's disease (HCEast Taw[place]    hx  . PONV (postoperative nausea and vomiting)     1. last surg Valve  suction caused sore on inner  gum rt side, 2 ." be careful with neck tilt have had whiplash few times, 3. Hypotension    Past Surgical History  Procedure Laterality Date  . Total abdominal hysterectomy w/ bilateral salpingoophorectomy  2000  . Breast excisional biopsy      benign, bilateral  . Nephrectomy  2008    Laparoscopic right nephrectomy, renal cell carcinoma  . Coronary artery bypass graft  2005, 2011    CABG-most recent SVG to RCA-12/05; RCA occluded with patent graft in 2006; Redo bypass in 07/2009  . Tumor excision  1981    removal of Hodgkins lymphoma  . Exploratory laparotomy w/ bowel resection      Small bowel resection of lipoma  . Aortic valve replacement  2011    Aortic valve replacement surgery with a 19 mm bioprosthetic device post redo CABG January 2011.  . Colonoscopy      Multiple with adenomatous  polyps  . Cervical biopsy    . Liver biopsy    . Bone marrow biopsy    . Esophagogastroduodenoscopy    . Esophagogastroduodenoscopy  06/23/2012    Procedure: ESOPHAGOGASTRODUODENOSCOPY (EGD);  Surgeon: Gatha Mayer, MD;  Location: [facility];  Service: Endoscopy;  Laterality: N/A;  . Colonoscopy  06/23/2012    Procedure: COLONOSCOPY;  Surgeon: Gatha Mayer, MD;  Location: [facility] ENDOSCOPY;  Service: Endoscopy;  Laterality: N/A;  [name] dilation  06/23/2012    Procedure: [name] DILATION;  Surgeon: Gatha Mayer, MD;  Location: [facility] ENDOSCOPY;  Service: Endoscopy;;  54 fr  . Pacemaker insertion  08/21/2012    Medtronic Adapta L dual-chamber pacemaker  . Esophagogastroduodenoscopy N/A 04/06/2013    Procedure: ESOPHAGOGASTRODUODENOSCOPY (EGD);  Surgeon: Irene Shipper, MD;  Location: [facility] ENDOSCOPY;  Service: Endoscopy;  Laterality: N/A;  . Givens capsule study N/A 04/30/2013    Procedure: GIVENS CAPSULE STUDY;  Surgeon: Gatha Mayer, MD;  Location: [place];  Service: Endoscopy;  Laterality: N/A;  . Loop recorder implant N/A 12/10/2011    Procedure: LOOP RECORDER IMPLANT;  Surgeon: Evans Lance, MD;  Location: [facility] CATH LAB;  Service: Cardiovascular;  Laterality: N/A;  . Permanent pacemaker insertion N/A 08/21/2012    Procedure: PERMANENT PACEMAKER INSERTION;  Surgeon: Evans Lance, MD;  Location[facility] CATH LAB;  Service: Cardiovascular;  Laterality: N/A;  . Left and right heart catheterization with coronary angiogram N/A 04/07/2013    Procedure: LEFT AND RIGHT HEART CATHETERIZATION WITH CORONARY ANGIOGRAM;  Surgeon: Larey Dresser, MD;  Location: [facility] CATH LAB;  Service: Cardiovascular;  Laterality: N/A;  . Percutaneous coronary stent intervention (pci-s) N/A 04/09/2013    Procedure: PERCUTANEOUS CORONARY STENT INTERVENTION (PCI-S);  Surgeon: Burnell Blanks, MD;  Location: [facility] CATH LAB;  Service: Cardiovascular;  Laterality: N/A;  . Left heart catheterization with coronary/graft angiogram  N/A 06/07/2014    Procedure: LEFT HEART CATHETERIZATION WITH Beatrix Fetters;  Surgeon: Burnell Blanks, MD;  Location: [facility] CATH LAB;  Service: Cardiovascular;  Laterality: N/A;  . Mastectomy Left 05/17/2015    total   . Total mastectomy Left 05/17/2015    Procedure: LEFT TOTAL MASTECTOMY;  Surgeon: Rolm Bookbinder, MD;  Location: [place];  Service: General;  Laterality: Left;    There were no vitals filed for this visit.  Visit Diagnosis:  Left shoulder pain  Shoulder joint stiffness, left  Postoperative edema      Subjective Assessment - 06/24/15 1025    Subjective It was fine after the last visit; I could tell she  had worked on my shoulder a little bit, but not in a bad way--a little tender.   Currently in Pain? Yes   Pain Score 1    Pain Location Chest   Pain Orientation Left   Pain Descriptors / Indicators Discomfort;Tightness            [facility] PT Assessment - 06/24/15 0001    AROM   Left Shoulder Flexion 105 Degrees   Left Shoulder ABduction 79 Degrees                     [facility] Adult PT Treatment/Exercise - 06/24/15 0001    Manual Therapy   Manual Therapy Myofascial release   Soft tissue mobilization Gently at left anterior axilla (pect tendon area and possible small cording area there) with left arm in some abduction.   Myofascial Release Gentle left UE myofascial pulling, stationary with arm in approx. 70 degrees abduction.   Scapular Mobilization In right sidelying, gentle left scapular mobilization at multiple angles; also mobilization of scapula as left arm was brought into flexion (passively).     Manual Lymphatic Drainage (MLD) In right sidelying, posterior interaxillary anastomosis left to right and left axillo-inguinal anastomosis.  In supine, short neck, right axilla and anterior interaxillary anastomosis, left groin and axillo-inguinal anastomosis, and left UE from wrist to shoulder.  Additional time spent from area of swelling  inferior tomastectomy incision to left groin.                   Short Term Clinic Goals - 06/24/15 1028    CC Short Term Goal  #1   Title pt will reports a decrease in pain by 50% so that she is able to do household activities with less pain    Baseline 70% improved on 06/24/15   Status Achieved   CC Short Term Goal  #2   Title Pt will have 90 degrees of left shoudler abduction so that she can dress herself with greater ease    Status Partially Met   CC Short Term Goal  #3   Title Pt will report a decrease in postoperative edema by 50% so she has less discomfort with activites of daily living    Baseline "maybe 10%" on 06/24/15   Status On-going             [facility] Goals - 06/10/15 1257    CC Long Term Goal  #1   Title Pt will have 120 degrees of left shoulder flexion so that she can put dishes up in a cabinet    Baseline 68   Time 8   Period Weeks   Status New   CC Long Term Goal  #2   Title pt will report that she has 75 % less pain so that she can enjoy activities outside of her home    Time 8   Period Weeks   Status New   CC Long Term Goal  #3   Title Pt will be independent in a home exercise program so that she can continue improvement on her own at home    Time 8   Period Weeks   Status New   CC Long Term Goal  #4   Title Pt will decrease Quick DASH score to < 38 demonstrating a improvement in function of left arm    Baseline 61.36   Time 8   Period Weeks   Status New  Plan - 06/24/15 1225    Clinical Impression Statement Patient reported benefit of working left scapula in right sidelying, including while working left shoulder into flexion.  AROM measurements of left shoulder flexion has improved significantly, and abduction has improved, though not as much.   Pt will benefit from skilled therapeutic intervention in order to improve on the following deficits Decreased skin integrity;Pain;Postural dysfunction;Decreased range of  motion;Decreased strength;Impaired UE functional use;Impaired perceived functional ability;Increased edema   Rehab Potential Excellent   Clinical Impairments Affecting Rehab Potential multiple allergies, poor pain control, strong cardiac history, type 1 DM    PT Frequency 3x / week   PT Duration 8 weeks   PT Treatment/Interventions Manual techniques;Manual lymph drainage   PT Next Visit Plan Continue gentle PROM and manual lymph drainage.  Add pect and chest stretches and scapular retraction as able.   Consulted and Agree with Plan of Care Patient        Problem List Patient Active Problem List   Diagnosis Date Noted  . Hx of radiation therapy 04/23/2015  . Thrombocytopenia ([place]) 04/23/2015  . 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 Osteopenia determined by x-ray 04/23/2015  . DM (diabetes mellitus) with complications ([place]) 23/76/2831  . Radiation therapy complication 51/76/1607  . History of nodular sclerosis Hodgkin's disease 10/27/2014  . Iron deficiency anemia due to chronic blood loss 10/27/2014  . Screening mammogram for high-risk patient 10/27/2014  . Insulin dependent type 1 diabetes mellitus ([place]) 10/07/2014  . Claudication ([place]) 07/29/2014  . PAF (paroxysmal atrial fibrillation) ([place])   . CKD (chronic kidney disease), stage II   . Coronary artery disease involving native coronary artery of native heart with unstable angina pectoris ([place])   . Type 2 diabetes mellitus with complication ([place])   . Abdominal bloating 06/09/2013  . Feeling bilious 06/09/2013  . Iron deficiency anemia 04/11/2013  . Unstable angina ([place]) 04/11/2013  . CAD (coronary artery disease) of artery bypass graft 04/03/2013  . Chronic diastolic CHF (congestive heart failure) ([place]) 04/03/2013  . Anemia 04/03/2013  . Arterial vascular disease 03/12/2013  . Aorta disorder ([place]) 03/12/2013  . Atrial fibrillation ([place])  03/12/2013  . Artificial cardiac pacemaker 03/12/2013  . CCF (congestive cardiac failure) ([place]) 03/12/2013  . Chronic kidney disease (CKD), stage III (moderate) 03/12/2013  . Gastric reflux 03/12/2013  . 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 Big thyroid 03/12/2013  . H/O Hodgkin's lymphoma 03/12/2013  . BP (high blood pressure) 03/12/2013  . Fitting and adjustment of insulin pump 03/12/2013  . Disorder of bone and cartilage, unspecified 03/12/2013  . Aortic heart valve narrowing 03/12/2013  . Encounter for fitting or adjustment of insulin pump 03/12/2013  . Lipoma of neck 09/23/2012  . Pacemaker 09/08/2012  . Chronic kidney disease, stage 3/cardiorenal syndrome 08/15/2012  . Atrial flutter ([place]) 07/23/2012  . Sick sinus syndrome ([place]) 07/21/2012  . Hyperplastic gastric polyp 06/23/2012  . Gastroparesis due to DM ([place]) 05/01/2012  . Hypercalcemia   . Cholelithiasis 07/20/2011  . Aortic stenosis status post 19 mm Edwards pericardial valve replacement 2011   . Arteriosclerotic cardiovascular disease (ASCVD)   . Osteopenia   . Anemia, normocytic normochromic   . NASH (nonalcoholic steatohepatitis)   . Renal cell carcinoma (HCC)     Class: History of  . Duodenal ulcer     Class: History of  . Ischemic colitis     Class: History of  .  Gastroesophageal reflux disease   . Hodgkin's disease   . Hypertension   . Small bowel obstruction ([place])   . Hyperlipidemia 04/11/2010  . Diabetes mellitus type 2, controlled ([place]) 03/23/2010  . Colonic polyps, adenomatous 09/29/2009    Contrina Orona 06/24/2015, 12:29 PM  [place], [place], [address] Phone: [phone]   Fax:  [phone]  Name: JERLYN PAIN MRN: [id number] Date of Birth: [birth year]    Serafina Royals, PT 06/24/2015 12:29 PM

## 2015-06-27 ENCOUNTER — Ambulatory Visit: Payer: Medicare Other

## 2015-06-27 DIAGNOSIS — M25512 Pain in left shoulder: Secondary | ICD-10-CM

## 2015-06-27 DIAGNOSIS — R293 Abnormal posture: Secondary | ICD-10-CM

## 2015-06-27 DIAGNOSIS — M25612 Stiffness of left shoulder, not elsewhere classified: Secondary | ICD-10-CM

## 2015-06-27 DIAGNOSIS — R609 Edema, unspecified: Secondary | ICD-10-CM

## 2015-06-27 NOTE — Therapy (Signed)
[place], [place], [address] Phone: [phone]   Fax:  [phone]  Physical Therapy Treatment  Patient Details  Name: [name] [name] MRN: [id number] Date of Birth: Aug 23, 1950 Referring Provider: Donne Hazel   Encounter Date: 06/27/2015      PT End of Session - 06/27/15 1126    Visit Number 6   Number of Visits 24   Date for PT Re-Evaluation 08/10/15   PT Start Time 0933   PT Stop Time 1021   PT Time Calculation (min) 48 min   Activity Tolerance Patient tolerated treatment well   Behavior During Therapy [facility] for tasks assessed/performed      Past Medical History  Diagnosis Date  . Aortic stenosis     a. bicuspid aortic valve; mean gradient of 20 mmHg in 2/10; b. s/p bioprosth AVR (19-mm Edwards pericardial valve) with a redo coronary artery bypass graft procedure in 07/2009; postoperative Dressler's syndrome    . Coronary artery disease     a. initial CABG with SVG-RCA in 12/05. b. redo SVG-RCA and bioprosthetic AVR in 1/11. d. 9/14 - PCI with DES to distal LM/proximal LAD was done rather than bypass as she would have been a poor candidate for redo sternotomy. d. S/p DES to prox LM 05/2014.  Marland Kitchen Chronic diastolic CHF (congestive heart failure) ([place])     a. 9.2014 EchoP EF 60-65%, no rwma, bioprosth AVR, mean gradient of 63mHg, mild to mod MR, PASP 574mg.  . Marland Kitchenyperlipidemia   . Hypertension   . Small bowel obstruction (HCC)     Recurrent; resolved after resection of a lipoma  . Osteopenia      hip on DEXA in October 2007.  . Marland Kitchennemia   . Carcinoma, renal cell (HCM[place]11/2008    Laparoscopic right nephrectomy  . NASH (nonalcoholic steatohepatitis) 1999    -biopsy in 1999  . Diabetes mellitus 03/2010     TYPE II: Hemoglobin A1c of 7.4 in  03/2010; 8.4 in 06/2010; treated with insulin pump  . Migraines   . Duodenal ulcer     Remote; H. pylori positive  . Colitis, ischemic (HCM[place]2008  .  Gastroesophageal reflux disease     ? of GASTROPARESIS; IRRITABLE BOWEL SYNDROME  . Hodgkin's disease (HCP[place] [place]1991    [place] radiation therapy  . Statin intolerance   . Hypercalcemia     Ca-11.1 in 07/2011  . Vitamin D deficiency   . Costochondritis   . Insomnia   . Iron deficiency anemia     a. 03/2013 EGD: essentially normal. Possible slow GIB.  . Marland Kitchenastroparesis due to DM (HCL[place]10/04/2012  . Hyperplastic gastric polyp 06/23/2012  . Arthritis   . Osteoporosis   . Pacemaker 08/21/2012    Medtronic Adapta L dual-chamber pacemaker-Dr. TaLovena Le. Anxiety   . Syncope     a. H/o recurrent syncope with pauses on loop recorder s/p Medtronic pacemaker 07/2012.  . CKD (chronic kidney disease), stage II   . PAF (paroxysmal atrial fibrillation) ([facility]Wonew[place]    a.  h/o brief episodes noted on ppm interrogation. b. given GI bleeding and need to be on both ASA 81 and Brilinta, risks likely outweigh benefits of anticoagulation.   . Marland Kitchenysrhythmia     sick sinus syndrome  . Heart murmur   . Hodgkin's disease (HCCl[place]    hx  . PONV (postoperative nausea and vomiting)     1. last surg Valve  suction caused sore on inner  gum rt side, 2 ." be careful with neck tilt have had whiplash few times, 3. Hypotension    Past Surgical History  Procedure Laterality Date  . Total abdominal hysterectomy w/ bilateral salpingoophorectomy  2000  . Breast excisional biopsy      benign, bilateral  . Nephrectomy  2008    Laparoscopic right nephrectomy, renal cell carcinoma  . Coronary artery bypass graft  2005, 2011    CABG-most recent SVG to RCA-12/05; RCA occluded with patent graft in 2006; Redo bypass in 07/2009  . Tumor excision  1981    removal of Hodgkins lymphoma  . Exploratory laparotomy w/ bowel resection      Small bowel resection of lipoma  . Aortic valve replacement  2011    Aortic valve replacement surgery with a 19 mm bioprosthetic device post redo CABG January 2011.  . Colonoscopy      Multiple with adenomatous  polyps  . Cervical biopsy    . Liver biopsy    . Bone marrow biopsy    . Esophagogastroduodenoscopy    . Esophagogastroduodenoscopy  06/23/2012    Procedure: ESOPHAGOGASTRODUODENOSCOPY (EGD);  Surgeon: Gatha Mayer, MD;  Location: [facility];  Service: Endoscopy;  Laterality: N/A;  . Colonoscopy  06/23/2012    Procedure: COLONOSCOPY;  Surgeon: Gatha Mayer, MD;  Location: [facility] ENDOSCOPY;  Service: Endoscopy;  Laterality: N/A;  [name] dilation  06/23/2012    Procedure: [name] DILATION;  Surgeon: Gatha Mayer, MD;  Location: [facility] ENDOSCOPY;  Service: Endoscopy;;  54 fr  . Pacemaker insertion  08/21/2012    Medtronic Adapta L dual-chamber pacemaker  . Esophagogastroduodenoscopy N/A 04/06/2013    Procedure: ESOPHAGOGASTRODUODENOSCOPY (EGD);  Surgeon: Irene Shipper, MD;  Location[facility] ENDOSCOPY;  Service: Endoscopy;  Laterality: N/A;  . Givens capsule study N/A 04/30/2013    Procedure: GIVENS CAPSULE STUDY;  Surgeon: Gatha Mayer, MD;  Location: [facility];  Service: Endoscopy;  Laterality: N/A;  . Loop recorder implant N/A 12/10/2011    Procedure: LOOP RECORDER IMPLANT;  Surgeon: Evans Lance, MD;  Location: [facility] CATH LAB;  Service: Cardiovascular;  Laterality: N/A;  . Permanent pacemaker insertion N/A 08/21/2012    Procedure: PERMANENT PACEMAKER INSERTION;  Surgeon: Evans Lance, MD;  Location: [facility] CATH LAB;  Service: Cardiovascular;  Laterality: N/A;  . Left and right heart catheterization with coronary angiogram N/A 04/07/2013    Procedure: LEFT AND RIGHT HEART CATHETERIZATION WITH CORONARY ANGIOGRAM;  Surgeon: Larey Dresser, MD;  Location: [facility] CATH LAB;  Service: Cardiovascular;  Laterality: N/A;  . Percutaneous coronary stent intervention (pci-s) N/A 04/09/2013    Procedure: PERCUTANEOUS CORONARY STENT INTERVENTION (PCI-S);  Surgeon: Burnell Blanks, MD;  Location: [name] CATH LAB;  Service: Cardiovascular;  Laterality: N/A;  . Left heart catheterization with coronary/graft angiogram  N/A 06/07/2014    Procedure: LEFT HEART CATHETERIZATION WITH Beatrix Fetters;  Surgeon: Burnell Blanks, MD;  Location: [facility] CATH LAB;  Service: Cardiovascular;  Laterality: N/A;  . Mastectomy Left 05/17/2015    total   . Total mastectomy Left 05/17/2015    Procedure: LEFT TOTAL MASTECTOMY;  Surgeon: Rolm Bookbinder, MD;  Location: [facility];  Service: General;  Laterality: Left;    There were no vitals filed for this visit.  Visit Diagnosis:  Left shoulder pain  Shoulder joint stiffness, left  Postoperative edema  Abnormal posture      Subjective Assessment - 06/27/15 0942    Subjective I have been feeling good after the last  2 sessions, some soreness but no increase pain. Not having any pain today either. I think the swelling under my incision has improved, I just feel sore around where Butch Penny said I have cording in my axilla. And I can pull a shirt over my head now with my increased ROM. I'm very excited about that because we leave for [place] next week and I don't have to take only button up shirts now that I can pull a shirt over my head and use my arm more. I already made more appts for after I get back as well. Very pleased with my progress thus far.   Currently in Pain? No/denies                         [facility] Adult PT Treatment/Exercise - 06/27/15 0001    Shoulder Exercises: Sidelying   Other Sidelying Exercises Laying in R/t S/L AAROM for D2 motion with scapular mobs a few reps then had pt do motion independently a few more reps with verbal cuing to "look at her hand" while she's perofrming the motion just to the point of feeling a stretch.    Shoulder Exercises: Standing   Other Standing Exercises Standing UE Ranger from floor but set at ~90 degrees for flexion and then scaption 2 sets of 5 reps each with verba; cuing for effective stretch and proper technique throughout. Pt reporting feeling good stretch with this nand no pain.    Manual Therapy    Soft tissue mobilization Gently at left anterior axilla (pect tendon area and possible small cording area there) with left arm in some abduction.   Myofascial Release Gentle left UE myofascial pulling, stationary with arm in approx. 70 degrees abduction.   Scapular Mobilization In right sidelying, gentle left scapular mobilization at multiple angles; also mobilization of scapula as left arm was brought into flexion and abduction (passively).     Manual Lymphatic Drainage (MLD) In right sidelying, posterior interaxillary anastomosis left to right and left axillo-inguinal anastomosis.  In supine, short neck, right axilla and anterior interaxillary anastomosis, left groin and axillo-inguinal anastomosis, and left UE from wrist to shoulder.  Additional time spent from area of swelling inferior to mastectomy incision to left groin.                PT Education - 06/27/15 1120    Education provided Yes   Education Details Verbally instructed pt to perform Rt trunk rotation while sitting in high back chair (that she reports she has) by reaching across body with Lt hand to hold Rt side of chair if able to reach, and then reach under Lt arm to perform gentle myofascial release at Lt trunk near axilla, where she c/o most tightness, with Rt hand pulling down during stretch. Also instructed pt to add Rt S/L D2 stretch that we did during session today.   Person(s) Educated Patient   Methods Explanation;Demonstration   Comprehension Verbalized understanding;Returned demonstration           Short Term Clinic Goals - 06/24/15 1028    CC Short Term Goal  #1   Title pt will reports a decrease in pain by 50% so that she is able to do household activities with less pain    Baseline 70% improved on 06/24/15   Status Achieved   CC Short Term Goal  #2   Title Pt will have 90 degrees of left shoudler abduction so that she can dress herself with  greater ease    Status Partially Met   CC Short Term Goal  #3    Title Pt will report a decrease in postoperative edema by 50% so she has less discomfort with activites of daily living    Baseline "maybe 10%" on 06/24/15   Status On-going             [facility] Goals - 06/10/15 1257    CC Long Term Goal  #1   Title Pt will have 120 degrees of left shoulder flexion so that she can put dishes up in a cabinet    Baseline 68   Time 8   Period Weeks   Status New   CC Long Term Goal  #2   Title pt will report that she has 75 % less pain so that she can enjoy activities outside of her home    Time 8   Period Weeks   Status New   CC Long Term Goal  #3   Title Pt will be independent in a home exercise program so that she can continue improvement on her own at home    Time 8   Period Weeks   Status New   CC Long Term Goal  #4   Title Pt will decrease Quick DASH score to < 38 demonstrating a improvement in function of left arm    Baseline 61.36   Time [place] - 06/27/15 1128    Clinical Impression Statement Mrs. Peale has been very pleased with her progress as hse has regained some AROM while having less pain. Her swelling is also less noticeable and her tenderness to touch has improved. She is tolerating increased PROM stretching which has shown that she does have some cording at her Lt axilla and pt was also able to tolerate some gentle myofascial release here during stretching today. Pt is making very good progress towards goals.    Pt will benefit from skilled therapeutic intervention in order to improve on the following deficits Decreased skin integrity;Pain;Postural dysfunction;Decreased range of motion;Decreased strength;Impaired UE functional use;Impaired perceived functional ability;Increased edema   Rehab Potential Excellent   Clinical Impairments Affecting Rehab Potential multiple allergies, poor pain control, strong cardiac history, type 1 DM    PT Frequency 3x / week   PT Duration 8  weeks   PT Treatment/Interventions Manual techniques;Manual lymph drainage   PT Next Visit Plan Continue gentle PROM and manual lymph drainage.  Add pect and chest stretches and scapular retraction as able. Issue HEP for stretches instructed in today if pt needs (trunk rotation stretch in chair and Rt S/L for Lt D2)   PT Home Exercise Plan Trunk rotation stretch in chair and Rt S/L for Lt D2 and continue wiht AAROM stretching   Consulted and Agree with Plan of Care Patient        Problem List Patient Active Problem List   Diagnosis Date Noted  . Hx of radiation therapy 04/23/2015  . Thrombocytopenia ([place]) 04/23/2015  . 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 Osteopenia determined by x-ray 04/23/2015  . DM (diabetes mellitus) with complications ([place]) 22/29/7989  . Radiation therapy complication 21/19/4174  . History of nodular sclerosis Hodgkin's disease 10/27/2014  . Iron deficiency anemia due to chronic blood loss 10/27/2014  . Screening mammogram  for high-risk patient 10/27/2014  . Insulin dependent type 1 diabetes mellitus ([place]) 10/07/2014  . Claudication ([place]) 07/29/2014  . PAF (paroxysmal atrial fibrillation) ([place])   . CKD (chronic kidney disease), stage II   . Coronary artery disease involving native coronary artery of native heart with unstable angina pectoris ([place])   . Type 2 diabetes mellitus with complication ([place])   . Abdominal bloating 06/09/2013  . Feeling bilious 06/09/2013  . Iron deficiency anemia 04/11/2013  . Unstable angina ([place]) 04/11/2013  . CAD (coronary artery disease) of artery bypass graft 04/03/2013  . Chronic diastolic CHF (congestive heart failure) ([place]) 04/03/2013  . Anemia 04/03/2013  . Arterial vascular disease 03/12/2013  . Aorta disorder ([place]) 03/12/2013  . Atrial fibrillation ([place]) 03/12/2013  . Artificial cardiac pacemaker 03/12/2013  . CCF (congestive  cardiac failure) ([place]) 03/12/2013  . Chronic kidney disease (CKD), stage III (moderate) 03/12/2013  . Gastric reflux 03/12/2013  . 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 Big thyroid 03/12/2013  . H/O Hodgkin's lymphoma 03/12/2013  . BP (high blood pressure) 03/12/2013  . Fitting and adjustment of insulin pump 03/12/2013  . Disorder of bone and cartilage, unspecified 03/12/2013  . Aortic heart valve narrowing 03/12/2013  . Encounter for fitting or adjustment of insulin pump 03/12/2013  . Lipoma of neck 09/23/2012  . Pacemaker 09/08/2012  . Chronic kidney disease, stage 3/cardiorenal syndrome 08/15/2012  . Atrial flutter ([place]) 07/23/2012  . Sick sinus syndrome ([place]) 07/21/2012  . Hyperplastic gastric polyp 06/23/2012  . Gastroparesis due to DM ([place]) 05/01/2012  . Hypercalcemia   . Cholelithiasis 07/20/2011  . Aortic stenosis status post 19 mm Edwards pericardial valve replacement 2011   . Arteriosclerotic cardiovascular disease (ASCVD)   . Osteopenia   . Anemia, normocytic normochromic   . NASH (nonalcoholic steatohepatitis)   . Renal cell carcinoma (HCC)     Class: History of  . Duodenal ulcer     Class: History of  . Ischemic colitis     Class: History of  . Gastroesophageal reflux disease   . Hodgkin's disease   . Hypertension   . Small bowel obstruction ([place])   . Hyperlipidemia 04/11/2010  . Diabetes mellitus type 2, controlled ([place]) 03/23/2010  . Colonic polyps, adenomatous 09/29/2009    Otelia Limes, PTA 06/27/2015, 11:32 AM  [place], [place], [address] Phone: [phone]   Fax:  [phone]  Name: [name] [name] MRN: [id number] Date of Birth: [birth year]

## 2015-06-29 ENCOUNTER — Ambulatory Visit: Payer: Medicare Other | Admitting: Physical Therapy

## 2015-06-29 DIAGNOSIS — M25512 Pain in left shoulder: Secondary | ICD-10-CM | POA: Diagnosis not present

## 2015-06-29 DIAGNOSIS — R609 Edema, unspecified: Secondary | ICD-10-CM

## 2015-06-29 DIAGNOSIS — M25612 Stiffness of left shoulder, not elsewhere classified: Secondary | ICD-10-CM

## 2015-06-29 DIAGNOSIS — R293 Abnormal posture: Secondary | ICD-10-CM

## 2015-06-29 NOTE — Therapy (Signed)
[place], [place], [address] Phone: [phone]   Fax:  [phone]  Physical Therapy Treatment  Patient Details  Name: KARAN INCLAN MRN: [id number] Date of Birth: November 15, 1950 Referring Provider: Donne Hazel   Encounter Date: 06/29/2015      PT End of Session - 06/29/15 1228    Visit Number 7   Number of Visits 24   Date for PT Re-Evaluation 08/10/15   PT Start Time 0935   PT Stop Time 1020   PT Time Calculation (min) 45 min   Activity Tolerance Patient tolerated treatment well   Behavior During Therapy [facility] for tasks assessed/performed      Past Medical History  Diagnosis Date  . Aortic stenosis     a. bicuspid aortic valve; mean gradient of 20 mmHg in 2/10; b. s/p bioprosth AVR (19-mm Edwards pericardial valve) with a redo coronary artery bypass graft procedure in 07/2009; postoperative Dressler's syndrome    . Coronary artery disease     a. initial CABG with SVG-RCA in 12/05. b. redo SVG-RCA and bioprosthetic AVR in 1/11. d. 9/14 - PCI with DES to distal LM/proximal LAD was done rather than bypass as she would have been a poor candidate for redo sternotomy. d. S/p DES to prox LM 05/2014.  Marland Kitchen Chronic diastolic CHF (congestive heart failure) ([place])     a. 9.2014 EchoP EF 60-65%, no rwma, bioprosth AVR, mean gradient of 58mHg, mild to mod MR, PASP 559mg.  . Marland Kitchenyperlipidemia   . Hypertension   . Small bowel obstruction (HCC)     Recurrent; resolved after resection of a lipoma  . Osteopenia      hip on DEXA in October 2007.  . Marland Kitchennemia   . Carcinoma, renal cell (HC[place]11/2008    Laparoscopic right nephrectomy  . NASH (nonalcoholic steatohepatitis) 1999    -biopsy in 1999  . Diabetes mellitus 03/2010     TYPE II: Hemoglobin A1c of 7.4 in  03/2010; 8.4 in 06/2010; treated with insulin pump  . Migraines   . Duodenal ulcer     Remote; H. pylori positive  . Colitis, ischemic (HCD[place]2008  .  Gastroesophageal reflux disease     ? of GASTROPARESIS; IRRITABLE BOWEL SYNDROME  . Hodgkin's disease (HC[place]1991    [place] radiation therapy  . Statin intolerance   . Hypercalcemia     Ca-11.1 in 07/2011  . Vitamin D deficiency   . Costochondritis   . Insomnia   . Iron deficiency anemia     a. 03/2013 EGD: essentially normal. Possible slow GIB.  . Marland Kitchenastroparesis due to DM (HCRio Rancho10/04/2012  . Hyperplastic gastric polyp 06/23/2012  . Arthritis   . Osteoporosis   . Pacemaker 08/21/2012    Medtronic Adapta L dual-chamber pacemaker-Dr. TaLovena Le. Anxiety   . Syncope     a. H/o recurrent syncope with pauses on loop recorder s/p Medtronic pacemaker 07/2012.  . CKD (chronic kidney disease), stage II   . PAF (paroxysmal atrial fibrillation) ([facility]ong[place]    a.  h/o brief episodes noted on ppm interrogation. b. given GI bleeding and need to be on both ASA 81 and Brilinta, risks likely outweigh benefits of anticoagulation.   . Marland Kitchenysrhythmia     sick sinus syndrome  . Heart murmur   . Hodgkin's disease (HCBlue Springs    hx  . PONV (postoperative nausea and vomiting)     1. last surg Valve  suction caused sore on inner  gum rt side, 2 ." be careful with neck tilt have had whiplash few times, 3. Hypotension    Past Surgical History  Procedure Laterality Date  . Total abdominal hysterectomy w/ bilateral salpingoophorectomy  2000  . Breast excisional biopsy      benign, bilateral  . Nephrectomy  2008    Laparoscopic right nephrectomy, renal cell carcinoma  . Coronary artery bypass graft  2005, 2011    CABG-most recent SVG to RCA-12/05; RCA occluded with patent graft in 2006; Redo bypass in 07/2009  . Tumor excision  1981    removal of Hodgkins lymphoma  . Exploratory laparotomy w/ bowel resection      Small bowel resection of lipoma  . Aortic valve replacement  2011    Aortic valve replacement surgery with a 19 mm bioprosthetic device post redo CABG January 2011.  . Colonoscopy      Multiple with adenomatous  polyps  . Cervical biopsy    . Liver biopsy    . Bone marrow biopsy    . Esophagogastroduodenoscopy    . Esophagogastroduodenoscopy  06/23/2012    Procedure: ESOPHAGOGASTRODUODENOSCOPY (EGD);  Surgeon: Gatha Mayer, MD;  Location: [facility];  Service: Endoscopy;  Laterality: N/A;  . Colonoscopy  06/23/2012    Procedure: COLONOSCOPY;  Surgeon: Gatha Mayer, MD;  Location: [facility] ENDOSCOPY;  Service: Endoscopy;  Laterality: N/A;  [name] dilation  06/23/2012    Procedure: [name] DILATION;  Surgeon: Gatha Mayer, MD;  Location: [facility] ENDOSCOPY;  Service: Endoscopy;;  54 fr  . Pacemaker insertion  08/21/2012    Medtronic Adapta L dual-chamber pacemaker  . Esophagogastroduodenoscopy N/A 04/06/2013    Procedure: ESOPHAGOGASTRODUODENOSCOPY (EGD);  Surgeon: Irene Shipper, MD;  Location: [facility] ENDOSCOPY;  Service: Endoscopy;  Laterality: N/A;  . Givens capsule study N/A 04/30/2013    Procedure: GIVENS CAPSULE STUDY;  Surgeon: Gatha Mayer, MD;  Location: [place];  Service: Endoscopy;  Laterality: N/A;  . Loop recorder implant N/A 12/10/2011    Procedure: LOOP RECORDER IMPLANT;  Surgeon: Evans Lance, MD;  Location: [facility] CATH LAB;  Service: Cardiovascular;  Laterality: N/A;  . Permanent pacemaker insertion N/A 08/21/2012    Procedure: PERMANENT PACEMAKER INSERTION;  Surgeon: Evans Lance, MD;  [facility]: [facility] C[facility] LAB;  Service: Cardiovascular;  Laterality: N/A;  . Left and right heart catheterization with coronary angiogram N/A 04/07/2013    Procedure: LEFT AND RIGHT HEART CATHETERIZATION WITH CORONARY ANGIOGRAM;  Surgeon: Larey Dresser, MD;  Location: [facility] C[facility] LAB;  Service: Cardiovascular;  Laterality: N/A;  . Percutaneous coronary stent intervention (pci-s) N/A 04/09/2013    Procedure: PERCUTANEOUS CORONARY STENT INTERVENTION (PCI-S);  Surgeon: Burnell Blanks, MD;  Location: [facility] CATH LAB;  Service: Cardiovascular;  Laterality: N/A;  . Left heart catheterization with coronary/graft angiogram  N/A 06/07/2014    Procedure: LEFT HEART CATHETERIZATION WITH Beatrix Fetters;  Surgeon: Burnell Blanks, MD;  Location[facility] LAB;  Service: Cardiovascular;  Laterality: N/A;  . Mastectomy Left 05/17/2015    total   . Total mastectomy Left 05/17/2015    Procedure: LEFT TOTAL MASTECTOMY;  Surgeon: Rolm Bookbinder, MD;  Location: [place];  Service: General;  Laterality: Left;    There were no vitals filed for this visit.  Visit Diagnosis:  Left shoulder pain  Shoulder joint stiffness, left  Postoperative edema  Abnormal posture      Subjective Assessment - 06/29/15 0949    Subjective I'm doing better.    Pertinent History  Type 1 DM with insulin pump , Hodgkins disease with surgery and radition, kidney caner with removal of right kidney 2008, CABG 2000,cardiac stents 2014,and 2015 and is on blood thinner   hysterectomy 2000 AVR 2008, pacemaker in 2013, liver disease   left mastectomy 05/17/2015 with no lymph nodes removed  drain removed nov7 .  She reports  she has lipodystrophy with removel of lipomas in the past and presence of lipoma at top of spine and possibly at right scapula, alos with history of scolios per patient     Patient Stated Goals to get pain relief   Currently in Pain? No/denies  minor burning under left arm                          [facility] Adult PT Treatment/Exercise - 06/29/15 0001    Self-Care   Self-Care Other Self-Care Comments   Other Self-Care Comments  gave patient 1/4" and 1/2" foam patch at axilla and lateral chest    Manual Therapy   Soft tissue mobilization Gently at left anterior axilla (pect tendon area and possible small cording area there) Pt very tender in this area    Manual Lymphatic Drainage (MLD) In Supine: Short neck on right side, left abdomen and diaphragmatic breathing, left inguinal nodes and left axillo-inguinal anastamosis, rigth axillary nodes and anterior inter-axillary anastomsis from midline, then to  sidelying for left lateral chest, back and posterior inter-axillary anastamosis.                   Short Term Clinic Goals - 06/24/15 1028    CC Short Term Goal  #1   Title pt will reports a decrease in pain by 50% so that she is able to do household activities with less pain    Baseline 70% improved on 06/24/15   Status Achieved   CC Short Term Goal  #2   Title Pt will have 90 degrees of left shoudler abduction so that she can dress herself with greater ease    Status Partially Met   CC Short Term Goal  #3   Title Pt will report a decrease in postoperative edema by 50% so she has less discomfort with activites of daily living    Baseline "maybe 10%" on 06/24/15   Status On-going             [facility] Goals - 06/10/15 1257    CC Long Term Goal  #1   Title Pt will have 120 degrees of left shoulder flexion so that she can put dishes up in a cabinet    Baseline 68   Time 8   Period Weeks   Status New   CC Long Term Goal  #2   Title pt will report that she has 75 % less pain so that she can enjoy activities outside of her home    Time 8   Period Weeks   Status New   CC Long Term Goal  #3   Title Pt will be independent in a home exercise program so that she can continue improvement on her own at home    Time 8   Period Weeks   Status New   CC Long Term Goal  #4   Title Pt will decrease Quick DASH score to < 38 demonstrating a improvement in function of left arm    Baseline 61.36   Time 8   Period Weeks   Status New  Plan - 06/29/15 1231    Clinical Impression Statement Pt had tenderness with palpation at anterior chest pec area with tightness that persisted  She will try some light compression patch under her bra to see if that will help and allow her to increase the range of motion of her arm.  She continues to receive some relief from manual lymph drainage.    Pt will benefit from skilled therapeutic intervention in order to improve on  the following deficits Decreased skin integrity;Pain;Postural dysfunction;Decreased range of motion;Decreased strength;Impaired UE functional use;Impaired perceived functional ability;Increased edema   Rehab Potential Excellent   Clinical Impairments Affecting Rehab Potential multiple allergies, poor pain control, strong cardiac history, type 1 DM    PT Next Visit Plan Continue gentle PROM and manual lymph drainage.  Add pect and chest stretches and scapular retraction as able. Continue HEP  (trunk rotation stretch in chair and Rt S/L for Lt D2)   PT Home Exercise Plan Trunk rotation stretch in chair and Rt S/L for Lt D2 and continue wiht AAROM stretching   Consulted and Agree with Plan of Care Patient        Problem List Patient Active Problem List   Diagnosis Date Noted  . Hx of radiation therapy 04/23/2015  . Thrombocytopenia ([place]) 04/23/2015  . 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 Osteopenia determined by x-ray 04/23/2015  . DM (diabetes mellitus) with complications ([place]) 25/85/2778  . Radiation therapy complication 24/23/5361  . History of nodular sclerosis Hodgkin's disease 10/27/2014  . Iron deficiency anemia due to chronic blood loss 10/27/2014  . Screening mammogram for high-risk patient 10/27/2014  . Insulin dependent type 1 diabetes mellitus ([place]) 10/07/2014  . Claudication ([place]) 07/29/2014  . PAF (paroxysmal atrial fibrillation) ([place])   . CKD (chronic kidney disease), stage II   . Coronary artery disease involving native coronary artery of native heart with unstable angina pectoris ([place])   . Type 2 diabetes mellitus with complication ([place])   . Abdominal bloating 06/09/2013  . Feeling bilious 06/09/2013  . Iron deficiency anemia 04/11/2013  . Unstable angina ([place]) 04/11/2013  . CAD (coronary artery disease) of artery bypass graft 04/03/2013  . Chronic diastolic CHF (congestive heart  failure) ([place]) 04/03/2013  . Anemia 04/03/2013  . Arterial vascular disease 03/12/2013  . Aorta disorder ([place]) 03/12/2013  . Atrial fibrillation ([place]) 03/12/2013  . Artificial cardiac pacemaker 03/12/2013  . CCF (congestive cardiac failure) ([place]) 03/12/2013  . Chronic kidney disease (CKD), stage III (moderate) 03/12/2013  . Gastric reflux 03/12/2013  . 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 Big thyroid 03/12/2013  . H/O Hodgkin's lymphoma 03/12/2013  . BP (high blood pressure) 03/12/2013  . Fitting and adjustment of insulin pump 03/12/2013  . Disorder of bone and cartilage, unspecified 03/12/2013  . Aortic heart valve narrowing 03/12/2013  . Encounter for fitting or adjustment of insulin pump 03/12/2013  . Lipoma of neck 09/23/2012  . Pacemaker 09/08/2012  . Chronic kidney disease, stage 3/cardiorenal syndrome 08/15/2012  . Atrial flutter ([place]) 07/23/2012  . Sick sinus syndrome ([place]) 07/21/2012  . Hyperplastic gastric polyp 06/23/2012  . Gastroparesis due to DM ([place]) 05/01/2012  . Hypercalcemia   . Cholelithiasis 07/20/2011  . Aortic stenosis status post 19 mm Edwards pericardial valve replacement 2011   . Arteriosclerotic cardiovascular disease (ASCVD)   . Osteopenia   . Anemia, normocytic normochromic   . NASH (nonalcoholic steatohepatitis)   .  Renal cell carcinoma (HCC)     Class: History of  . Duodenal ulcer     Class: History of  . Ischemic colitis     Class: History of  . Gastroesophageal reflux disease   . Hodgkin's disease   . Hypertension   . Small bowel obstruction ([place])   . Hyperlipidemia 04/11/2010  . Diabetes mellitus type 2, controlled ([place]) 03/23/2010  . Colonic polyps, adenomatous 09/29/2009   Donato Heinz. Owens Shark, PT  06/29/2015, 12:39 PM  [place], [place], [address] Phone: [phone]   Fax:  [phone]  Name: JOSEE SPEECE MRN: [id number] Date of Birth: August 29, 1950

## 2015-06-30 ENCOUNTER — Encounter: Payer: Self-pay | Admitting: Cardiology

## 2015-07-01 ENCOUNTER — Ambulatory Visit: Payer: Medicare Other | Admitting: Physical Therapy

## 2015-07-01 DIAGNOSIS — M25512 Pain in left shoulder: Secondary | ICD-10-CM | POA: Diagnosis not present

## 2015-07-01 DIAGNOSIS — R293 Abnormal posture: Secondary | ICD-10-CM

## 2015-07-01 DIAGNOSIS — M25612 Stiffness of left shoulder, not elsewhere classified: Secondary | ICD-10-CM

## 2015-07-01 NOTE — Therapy (Signed)
[place], [place], [address] Phone: [phone]   Fax:  [phone]  Physical Therapy Treatment  Patient Details  Name: [name] [name] MRN: [id number] Date of Birth: 02-Apr-1951 Referring Provider: Donne Hazel   Encounter Date: 07/01/2015      PT End of Session - 07/01/15 1126    Visit Number 8   Number of Visits 24   Date for PT Re-Evaluation 08/10/15   PT Start Time 0932   PT Stop Time 1020   PT Time Calculation (min) 48 min   Activity Tolerance Patient tolerated treatment well   Behavior During Therapy [facility] for tasks assessed/performed      Past Medical History  Diagnosis Date  . Aortic stenosis     a. bicuspid aortic valve; mean gradient of 20 mmHg in 2/10; b. s/p bioprosth AVR (19-mm Edwards pericardial valve) with a redo coronary artery bypass graft procedure in 07/2009; postoperative Dressler's syndrome    . Coronary artery disease     a. initial CABG with SVG-RCA in 12/05. b. redo SVG-RCA and bioprosthetic AVR in 1/11. d. 9/14 - PCI with DES to distal LM/proximal LAD was done rather than bypass as she would have been a poor candidate for redo sternotomy. d. S/p DES to prox LM 05/2014.  Marland Kitchen Chronic diastolic CHF (congestive heart failure) ([place])     a. 9.2014 EchoP EF 60-65%, no rwma, bioprosth AVR, mean gradient of 57mHg, mild to mod MR, PASP 559mg.  . Marland Kitchenyperlipidemia   . Hypertension   . Small bowel obstruction (HCC)     Recurrent; resolved after resection of a lipoma  . Osteopenia      hip on DEXA in October 2007.  . Marland Kitchennemia   . Carcinoma, renal cell (HCDoolittle11/2008    Laparoscopic right nephrectomy  . NASH (nonalcoholic steatohepatitis) 1999    -biopsy in 1999  . Diabetes mellitus 03/2010     TYPE II: Hemoglobin A1c of 7.4 in  03/2010; 8.4 in 06/2010; treated with insulin pump  . Migraines   . Duodenal ulcer     Remote; H. pylori positive  . Colitis, ischemic (HCPocola2008  .  Gastroesophageal reflux disease     ? of GASTROPARESIS; IRRITABLE BOWEL SYNDROME  . Hodgkin's disease (HCMel[place]1991    [place] radiation therapy  . Statin intolerance   . Hypercalcemia     Ca-11.1 in 07/2011  . Vitamin D deficiency   . Costochondritis   . Insomnia   . Iron deficiency anemia     a. 03/2013 EGD: essentially normal. Possible slow GIB.  . Marland Kitchenastroparesis due to DM (HCFirst Mesa10/04/2012  . Hyperplastic gastric polyp 06/23/2012  . Arthritis   . Osteoporosis   . Pacemaker 08/21/2012    Medtronic Adapta L dual-chamber pacemaker-Dr. TaLovena Le. Anxiety   . Syncope     a. H/o recurrent syncope with pauses on loop recorder s/p Medtronic pacemaker 07/2012.  . CKD (chronic kidney disease), stage II   . PAF (paroxysmal atrial fibrillation) (HCAk[place]    a.  h/o brief episodes noted on ppm interrogation. b. given GI bleeding and need to be on both ASA 81 and Brilinta, risks likely outweigh benefits of anticoagulation.   . Marland Kitchenysrhythmia     sick sinus syndrome  . Heart murmur   . Hodgkin's disease (HCNew Hampton    hx  . PONV (postoperative nausea and vomiting)     1. last surg Valve  suction caused sore on inner  gum rt side, 2 ." be careful with neck tilt have had whiplash few times, 3. Hypotension    Past Surgical History  Procedure Laterality Date  . Total abdominal hysterectomy w/ bilateral salpingoophorectomy  2000  . Breast excisional biopsy      benign, bilateral  . Nephrectomy  2008    Laparoscopic right nephrectomy, renal cell carcinoma  . Coronary artery bypass graft  2005, 2011    CABG-most recent SVG to RCA-12/05; RCA occluded with patent graft in 2006; Redo bypass in 07/2009  . Tumor excision  1981    removal of Hodgkins lymphoma  . Exploratory laparotomy w/ bowel resection      Small bowel resection of lipoma  . Aortic valve replacement  2011    Aortic valve replacement surgery with a 19 mm bioprosthetic device post redo CABG January 2011.  . Colonoscopy      Multiple with adenomatous  polyps  . Cervical biopsy    . Liver biopsy    . Bone marrow biopsy    . Esophagogastroduodenoscopy    . Esophagogastroduodenoscopy  06/23/2012    Procedure: ESOPHAGOGASTRODUODENOSCOPY (EGD);  Surgeon: Gatha Mayer, MD;  Location: [facility];  Service: Endoscopy;  Laterality: N/A;  . Colonoscopy  06/23/2012    Procedure: COLONOSCOPY;  Surgeon: Gatha Mayer, MD;  Location: [facility] ENDOSCOPY;  Service: Endoscopy;  Laterality: N/A;  [name] dilation  06/23/2012    Procedure: [name] DILATION;  Surgeon: Gatha Mayer, MD;  Location: [facility] ENDOSCOPY;  Service: Endoscopy;;  54 fr  . Pacemaker insertion  08/21/2012    Medtronic Adapta L dual-chamber pacemaker  . Esophagogastroduodenoscopy N/A 04/06/2013    Procedure: ESOPHAGOGASTRODUODENOSCOPY (EGD);  Surgeon: Irene Shipper, MD;  Location[facility]COPY;  Service: Endoscopy;  Laterality: N/A;  . Givens capsule study N/A 04/30/2013    Procedure: GIVENS CAPSULE STUDY;  Surgeon: Gatha Mayer, MD;  Location: [facility];  Service: Endoscopy;  Laterality: N/A;  . Loop recorder implant N/A 12/10/2011    Procedure: LOOP RECORDER IMPLANT;  Surgeon: Evans Lance, MD;  Location: [facility] CATH LAB;  Service: Cardiovascular;  Laterality: N/A;  . Permanent pacemaker insertion N/A 08/21/2012    Procedure: PERMANENT PACEMAKER INSERTION;  Surgeon: Evans Lance, MD;  Location: [facility] CATH LAB;  Service: Cardiovascular;  Laterality: N/A;  . Left and right heart catheterization with coronary angiogram N/A 04/07/2013    Procedure: LEFT AND RIGHT HEART CATHETERIZATION WITH CORONARY ANGIOGRAM;  Surgeon: Larey Dresser, MD;  Location: [facility] CATH LAB;  Service: Cardiovascular;  Laterality: N/A;  . Percutaneous coronary stent intervention (pci-s) N/A 04/09/2013    Procedure: PERCUTANEOUS CORONARY STENT INTERVENTION (PCI-S);  Surgeon: Burnell Blanks, MD;  Location: [facility] CATH LAB;  Service: Cardiovascular;  Laterality: N/A;  . Left heart catheterization with coronary/graft angiogram  N/A 06/07/2014    Procedure: LEFT HEART CATHETERIZATION WITH Beatrix Fetters;  Surgeon: Burnell Blanks, MD;  Location: [facility] CATH LAB;  Service: Cardiovascular;  Laterality: N/A;  . Mastectomy Left 05/17/2015    total   . Total mastectomy Left 05/17/2015    Procedure: LEFT TOTAL MASTECTOMY;  Surgeon: Rolm Bookbinder, MD;  Location: [facility];  Service: General;  Laterality: Left;    There were no vitals filed for this visit.  Visit Diagnosis:  Left shoulder pain  Shoulder joint stiffness, left  Abnormal posture      Subjective Assessment - 07/01/15 0937    Subjective "That pad Helene Kelp gave me stays in a little while, but  then it 'walks' toward the front of my bra and out."  Just have a few steri strips left.     Pain Score 1    Pain Location Axilla   Pain Orientation Left   Pain Descriptors / Indicators Tender   Aggravating Factors  if doesn't have the Inspire bra on it, or lying on the left side   Pain Relieving Factors opposite of above            [facility] PT Assessment - 07/01/15 0001    AROM   Left Shoulder Flexion 112 Degrees   Left Shoulder ABduction 100 Degrees  slight scaption                     [facility] Adult PT Treatment/Exercise - 07/01/15 0001    Manual Therapy   Soft tissue mobilization Gently at left anterior axilla (pect tendon area and possible small cording area there) with left arm in slight abduction; in right sidelying, mobilization of left lateral chest tissue.   Myofascial Release Tried left UE myofascial pulling; little motion tolerated with pulling..  In right sidelying, pulling of left UE with concurrent distraction at left flank inferior to axilla (which patient thought was particularly effective). Crosshands technique to left flank in vertical and diagonal directions in right sidelying.   Scapular Mobilization In right sidelying, gentle left scapular movements.   Manual Lymphatic Drainage (MLD) In right sidelying, posterior  interaxillary anastomosis left to right and left axillo-inguinal anastomosis.   Passive ROM Left shoulder PROM to tolerance, but patient with poor tolerance today, reporting sharp pain.; supine over towel roll manual pect minor stretches.                   Short Term Clinic Goals - 07/01/15 1129    CC Short Term Goal  #2   Title Pt will have 90 degrees of left shoudler abduction so that she can dress herself with greater ease    Status Achieved             [facility] Goals - 06/10/15 1257    CC Long Term Goal  #1   Title Pt will have 120 degrees of left shoulder flexion so that she can put dishes up in a cabinet    Baseline 68   Time 8   Period Weeks   Status New   CC Long Term Goal  #2   Title pt will report that she has 75 % less pain so that she can enjoy activities outside of her home    Time 8   Period Weeks   Status New   CC Long Term Goal  #3   Title Pt will be independent in a home exercise program so that she can continue improvement on her own at home    Time 8   Period Weeks   Status New   CC Long Term Goal  #4   Title Pt will decrease Quick DASH score to < 38 demonstrating a improvement in function of left arm    Baseline 61.36   Time 8   Period Weeks   Status New            Plan - 07/01/15 1127    Clinical Impression Statement Patient had nice gains in left shoulder AROM, particularly in abduction today.  Still feels significant tightness and sometimes fullness at left flank.   Pt will benefit from skilled therapeutic intervention in order to improve on  the following deficits Decreased skin integrity;Pain;Postural dysfunction;Decreased range of motion;Decreased strength;Impaired UE functional use;Impaired perceived functional ability;Increased edema   Rehab Potential Excellent   Clinical Impairments Affecting Rehab Potential multiple allergies, poor pain control, strong cardiac history, type 1 DM    PT Frequency 3x / week   PT  Duration 8 weeks   PT Treatment/Interventions Manual techniques;Manual lymph drainage   PT Next Visit Plan Continue gentle PROM and manual lymph drainage.  Continue stretch in right sidelying with pulling on left UE and concurrent distraction at left flank.  Add pect and chest stretches and scapular retraction as able. Continue HEP  (trunk rotation stretch in chair and Rt S/L for Lt D2)   Consulted and Agree with Plan of Care Patient        Problem List Patient Active Problem List   Diagnosis Date Noted  . Hx of radiation therapy 04/23/2015  . Thrombocytopenia ([place]) 04/23/2015  . 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 Osteopenia determined by x-ray 04/23/2015  . DM (diabetes mellitus) with complications ([place]) 37/16/9678  . Radiation therapy complication 93/81/0175  . History of nodular sclerosis Hodgkin's disease 10/27/2014  . Iron deficiency anemia due to chronic blood loss 10/27/2014  . Screening mammogram for high-risk patient 10/27/2014  . Insulin dependent type 1 diabetes mellitus ([place]) 10/07/2014  . Claudication ([place]) 07/29/2014  . PAF (paroxysmal atrial fibrillation) ([place])   . CKD (chronic kidney disease), stage II   . Coronary artery disease involving native coronary artery of native heart with unstable angina pectoris ([place])   . Type 2 diabetes mellitus with complication ([place])   . Abdominal bloating 06/09/2013  . Feeling bilious 06/09/2013  . Iron deficiency anemia 04/11/2013  . Unstable angina ([place]) 04/11/2013  . CAD (coronary artery disease) of artery bypass graft 04/03/2013  . Chronic diastolic CHF (congestive heart failure) ([place]) 04/03/2013  . Anemia 04/03/2013  . Arterial vascular disease 03/12/2013  . Aorta disorder ([place]) 03/12/2013  . Atrial fibrillation ([place]) 03/12/2013  . Artificial cardiac pacemaker 03/12/2013  . CCF (congestive cardiac failure) ([place]) 03/12/2013  . Chronic  kidney disease (CKD), stage III (moderate) 03/12/2013  . Gastric reflux 03/12/2013  . 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 Big thyroid 03/12/2013  . H/O Hodgkin's lymphoma 03/12/2013  . BP (high blood pressure) 03/12/2013  . Fitting and adjustment of insulin pump 03/12/2013  . Disorder of bone and cartilage, unspecified 03/12/2013  . Aortic heart valve narrowing 03/12/2013  . Encounter for fitting or adjustment of insulin pump 03/12/2013  . Lipoma of neck 09/23/2012  . Pacemaker 09/08/2012  . Chronic kidney disease, stage 3/cardiorenal syndrome 08/15/2012  . Atrial flutter ([place]) 07/23/2012  . Sick sinus syndrome ([place]) 07/21/2012  . Hyperplastic gastric polyp 06/23/2012  . Gastroparesis due to DM ([place]) 05/01/2012  . Hypercalcemia   . Cholelithiasis 07/20/2011  . Aortic stenosis status post 19 mm Edwards pericardial valve replacement 2011   . Arteriosclerotic cardiovascular disease (ASCVD)   . Osteopenia   . Anemia, normocytic normochromic   . NASH (nonalcoholic steatohepatitis)   . Renal cell carcinoma (HCC)     Class: History of  . Duodenal ulcer     Class: History of  . Ischemic colitis     Class: History of  . Gastroesophageal reflux disease   . Hodgkin's disease   . Hypertension   . Small bowel obstruction ([place])   . Hyperlipidemia 04/11/2010  . Diabetes mellitus  type 2, controlled ([place]) 03/23/2010  . Colonic polyps, adenomatous 09/29/2009    Davionne Mastrangelo 07/01/2015, 11:30 AM  [place], [place], [address] Phone: [phone]   Fax:  [phone]  Name: [name] [name] MRN: [id number] Date of Birth: 10-13-1950    Serafina Royals, PT 07/01/2015 11:30 AM

## 2015-07-04 ENCOUNTER — Ambulatory Visit: Payer: Medicare Other

## 2015-07-04 DIAGNOSIS — M25512 Pain in left shoulder: Secondary | ICD-10-CM

## 2015-07-04 DIAGNOSIS — R293 Abnormal posture: Secondary | ICD-10-CM

## 2015-07-04 DIAGNOSIS — R609 Edema, unspecified: Secondary | ICD-10-CM

## 2015-07-04 DIAGNOSIS — M25612 Stiffness of left shoulder, not elsewhere classified: Secondary | ICD-10-CM

## 2015-07-04 NOTE — Therapy (Signed)
[place], [name], [address] Phone: [phone]   Fax:  [phone]  Physical Therapy Treatment  Patient Details  Name: [name] [name] MRN: [id number] Date of Birth: 1951/05/10 Referring Provider: Donne Hazel   Encounter Date: 07/04/2015      PT End of Session - 07/04/15 1037    Visit Number 9   Number of Visits 24   Date for PT Re-Evaluation 08/10/15   PT Start Time 0937   PT Stop Time 1031   PT Time Calculation (min) 54 min   Activity Tolerance Patient tolerated treatment well   Behavior During Therapy [facility] for tasks assessed/performed      Past Medical History  Diagnosis Date  . Aortic stenosis     a. bicuspid aortic valve; mean gradient of 20 mmHg in 2/10; b. s/p bioprosth AVR (19-mm Edwards pericardial valve) with a redo coronary artery bypass graft procedure in 07/2009; postoperative Dressler's syndrome    . Coronary artery disease     a. initial CABG with SVG-RCA in 12/05. b. redo SVG-RCA and bioprosthetic AVR in 1/11. d. 9/14 - PCI with DES to distal LM/proximal LAD was done rather than bypass as she would have been a poor candidate for redo sternotomy. d. S/p DES to prox LM 05/2014.  Marland Kitchen Chronic diastolic CHF (congestive heart failure) ([place])     a. 9.2014 EchoP EF 60-65%, no rwma, bioprosth AVR, mean gradient of 63mHg, mild to mod MR, PASP 54mg.  . Marland Kitchenyperlipidemia   . Hypertension   . Small bowel obstruction (HCC)     Recurrent; resolved after resection of a lipoma  . Osteopenia      hip on DEXA in October 2007.  . Marland Kitchennemia   . Carcinoma, renal cell (HCW[facility]11/2008    Laparoscopic right nephrectomy  . NASH (nonalcoholic steatohepatitis) 1999    -biopsy in 1999  . Diabetes mellitus 03/2010     TYPE II: Hemoglobin A1c of 7.4 in  03/2010; 8.4 in 06/2010; treated with insulin pump  . Migraines   . Duodenal ulcer     Remote; H. pylori positive  . Colitis, ischemic ([facility]CL[facility]2008  .  Gastroesophageal reflux disease     ? of GASTROPARESIS; IRRITABLE BOWEL SYNDROME  . Hodgkin's disease ([facility]1991    [place] radiation therapy  . Statin intolerance   . Hypercalcemia     Ca-11.1 in 07/2011  . Vitamin D deficiency   . Costochondritis   . Insomnia   . Iron deficiency anemia     a. 03/2013 EGD: essentially normal. Possible slow GIB.  . Marland Kitchenastroparesis due to DM (HCCedar Valley10/04/2012  . Hyperplastic gastric polyp 06/23/2012  . Arthritis   . Osteoporosis   . Pacemaker 08/21/2012    Medtronic Adapta L dual-chamber pacemaker-Dr. TaLovena Le. Anxiety   . Syncope     a. H/o recurrent syncope with pauses on loop recorder s/p Medtronic pacemaker 07/2012.  . CKD (chronic kidney disease), stage II   . PAF (paroxysmal atrial fibrillation) ([facility]St. Xavier    a.  h/o brief episodes noted on ppm interrogation. b. given GI bleeding and need to be on both ASA 81 and Brilinta, risks likely outweigh benefits of anticoagulation.   . Marland Kitchenysrhythmia     sick sinus syndrome  . Heart murmur   . Hodgkin's disease (HCKick[facility] 2    hx  . PONV (postoperative nausea and vomiting)     1. last surg Valve  suction caused sore on inner  gum rt side, 2 ." be careful with neck tilt have had whiplash few times, 3. Hypotension    Past Surgical History  Procedure Laterality Date  . Total abdominal hysterectomy w/ bilateral salpingoophorectomy  2000  . Breast excisional biopsy      benign, bilateral  . Nephrectomy  2008    Laparoscopic right nephrectomy, renal cell carcinoma  . Coronary artery bypass graft  2005, 2011    CABG-most recent SVG to RCA-12/05; RCA occluded with patent graft in 2006; Redo bypass in 07/2009  . Tumor excision  1981    removal of Hodgkins lymphoma  . Exploratory laparotomy w/ bowel resection      Small bowel resection of lipoma  . Aortic valve replacement  2011    Aortic valve replacement surgery with a 19 mm bioprosthetic device post redo CABG January 2011.  . Colonoscopy      Multiple with adenomatous  polyps  . Cervical biopsy    . Liver biopsy    . Bone marrow biopsy    . Esophagogastroduodenoscopy    . Esophagogastroduodenoscopy  06/23/2012    Procedure: ESOPHAGOGASTRODUODENOSCOPY (EGD);  Surgeon: Gatha Mayer, MD;  Location: [facility];  Service: Endoscopy;  Laterality: N/A;  . Colonoscopy  06/23/2012    Procedure: COLONOSCOPY;  Surgeon: Gatha Mayer, MD;  Location: [facility] ENDOSCOPY;  Service: Endoscopy;  Laterality: N/A;  [name] dilation  06/23/2012    Procedure: [name] DILATION;  Surgeon: Gatha Mayer, MD;  Location: [facility] ENDOSCOPY;  Service: Endoscopy;;  54 fr  . Pacemaker insertion  08/21/2012    Medtronic Adapta L dual-chamber pacemaker  . Esophagogastroduodenoscopy N/A 04/06/2013    Procedure: ESOPHAGOGASTRODUODENOSCOPY (EGD);  Surgeon: Irene Shipper, MD;  Location[facility] ENDOSCOPY;  Service: Endoscopy;  Laterality: N/A;  . Givens capsule study N/A 04/30/2013    Procedure: GIVENS CAPSULE STUDY;  Surgeon: Gatha Mayer, MD;  Location: [place];  Service: Endoscopy;  Laterality: N/A;  . Loop recorder implant N/A 12/10/2011    Procedure: LOOP RECORDER IMPLANT;  Surgeon: Evans Lance, MD;  Location: [facility] CATH LAB;  Service: Cardiovascular;  Laterality: N/A;  . Permanent pacemaker insertion N/A 08/21/2012    Procedure: PERMANENT PACEMAKER INSERTION;  Surgeon: Evans Lance, MD;  Location: [facility] CATH LAB;  Service: Cardiovascular;  Laterality: N/A;  . Left and right heart catheterization with coronary angiogram N/A 04/07/2013    Procedure: LEFT AND RIGHT HEART CATHETERIZATION WITH CORONARY ANGIOGRAM;  Surgeon: Larey Dresser, MD;  Location: [facility] CATH LAB;  Service: Cardiovascular;  Laterality: N/A;  . Percutaneous coronary stent intervention (pci-s) N/A 04/09/2013    Procedure: PERCUTANEOUS CORONARY STENT INTERVENTION (PCI-S);  Surgeon: Burnell Blanks, MD;  Location[facility] C[facility] LAB;  Service: Cardiovascular;  Laterality: N/A;  . Left heart catheterization with coronary/graft angiogram  N/A 06/07/2014    Procedure: LEFT HEART CATHETERIZATION WITH Beatrix Fetters;  Surgeon: Burnell Blanks, MD;  Location: [facility] CATH LAB;  Service: Cardiovascular;  Laterality: N/A;  . Mastectomy Left 05/17/2015    total   . Total mastectomy Left 05/17/2015    Procedure: LEFT TOTAL MASTECTOMY;  Surgeon: Rolm Bookbinder, MD;  Location: [place];  Service: General;  Laterality: Left;    There were no vitals filed for this visit.  Visit Diagnosis:  Left shoulder pain  Shoulder joint stiffness, left  Abnormal posture  Postoperative edema      Subjective Assessment - 07/04/15 0943    Subjective I'm feeling better every day! I can lay  on my Lt side for short periods of time now and can tell the swelling at my side though still there has improved.   Pertinent History Type 1 DM with insulin pump , Hodgkins disease with surgery and radition, kidney caner with removal of right kidney 2008, CABG 2000,cardiac stents 2014,and 2015 and is on blood thinner   hysterectomy 2000 AVR 2008, pacemaker in 2013, liver disease   left mastectomy 05/17/2015 with no lymph nodes removed  drain removed nov7 .  She reports  she has lipodystrophy with removel of lipomas in the past and presence of lipoma at top of spine and possibly at right scapula, alos with history of scolios per patient     Patient Stated Goals to get pain relief   Currently in Pain? No/denies                         [facility] Adult PT Treatment/Exercise - 07/04/15 0001    Manual Therapy   Soft tissue mobilization Gently at left anterior axilla (pect tendon area and possible small cording area there) with left arm in slight abduction; in right sidelying, mobilization of left lateral chest tissue.   Myofascial Release Tried left UE myofascial pulling; little motion tolerated with pulling..  In right sidelying, pulling of left UE with concurrent distraction at left flank inferior to axilla (which patient thought was  particularly effective). Crosshands technique to left flank in vertical and diagonal directions in right sidelying.   Scapular Mobilization In right sidelying, gentle left scapular movements.   Manual Lymphatic Drainage (MLD) Initiated in supine for Lt inguinal and Rt axillary nodes, anterior inter-axillary anastomosis and Lt axillo-inguinal anastomosis then continued when in right sidelying, posterior interaxillary anastomosis left to right and left axillo-inguinal anastomosis.   Passive ROM Left shoulder PROM to tolerance into flexion, abduction and D2 in supine and Rt S/L                   Short Term Clinic Goals - 07/04/15 1043    CC Short Term Goal  #1   Title pt will reports a decrease in pain by 50% so that she is able to do household activities with less pain    Status Achieved   CC Short Term Goal  #2   Title Pt will have 90 degrees of left shoudler abduction so that she can dress herself with greater ease    Status Achieved   CC Short Term Goal  #3   Title Pt will report a decrease in postoperative edema by 50% so she has less discomfort with activites of daily living   About 25%, I can lay on that side now and rest my arm on that side when I'm laying on my Rt.   Status On-going             Long Term Clinic Goals - 07/04/15 1045    CC Long Term Goal  #1   Title Pt will have 120 degrees of left shoulder flexion so that she can put dishes up in a cabinet   112 degrees attained last session on 07/01/15   Status On-going   CC Long Term Goal  #2   Title pt will report that she has 75 % less pain so that she can enjoy activities outside of her home   >50% reduction at this time 07/04/15   Status On-going   CC Long Term Goal  #3   Title Pt will  be independent in a home exercise program so that she can continue improvement on her own at home    Status On-going   CC Long Term Goal  #4   Title Pt will decrease Quick DASH score to < 38 demonstrating a improvement in  function of left arm    Status On-going            Plan - 07/04/15 1038    Clinical Impression Statement Pt continues to tolerates stretching a little better each session and though certainly still palpable her tightness at Lt flank/axilla is improving and she reports noticing the same. Pt will be out of town vacationing until after Christmas when she will return to therapy   Pt will benefit from skilled therapeutic intervention in order to improve on the following deficits Decreased skin integrity;Pain;Postural dysfunction;Decreased range of motion;Decreased strength;Impaired UE functional use;Impaired perceived functional ability;Increased edema   Rehab Potential Excellent   Clinical Impairments Affecting Rehab Potential multiple allergies, poor pain control, strong cardiac history, type 1 DM    PT Frequency 3x / week   PT Duration 8 weeks   PT Treatment/Interventions Manual techniques;Manual lymph drainage   PT Next Visit Plan Assess pt/have her retake the DASH as she will have been gone for about 2 weeks from her trip to [place] with family. Pt would like to be instructed in scar massage upon return and to continue gentle PROM and manual lymph drainage.  Continue stretch in right sidelying with pulling on left UE and concurrent distraction at left flank.  Progress HEP prn.    Consulted and Agree with Plan of Care Patient        Problem List Patient Active Problem List   Diagnosis Date Noted  . Hx of radiation therapy 04/23/2015  . Thrombocytopenia ([name]) 04/23/2015  . 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 Osteopenia determined by x-ray 04/23/2015  . DM (diabetes mellitus) with complications ([place]) 92/05/9416  . Radiation therapy complication 40/81/4481  . History of nodular sclerosis Hodgkin's disease 10/27/2014  . Iron deficiency anemia due to chronic blood loss 10/27/2014  . Screening  mammogram for high-risk patient 10/27/2014  . Insulin dependent type 1 diabetes mellitus ([place]) 10/07/2014  . Claudication ([place]) 07/29/2014  . PAF (paroxysmal atrial fibrillation) ([place])   . CKD (chronic kidney disease), stage II   . Coronary artery disease involving native coronary artery of native heart with unstable angina pectoris ([place])   . Type 2 diabetes mellitus with complication ([place])   . Abdominal bloating 06/09/2013  . Feeling bilious 06/09/2013  . Iron deficiency anemia 04/11/2013  . Unstable angina ([place]) 04/11/2013  . CAD (coronary artery disease) of artery bypass graft 04/03/2013  . Chronic diastolic CHF (congestive heart failure) ([place]) 04/03/2013  . Anemia 04/03/2013  . Arterial vascular disease 03/12/2013  . Aorta disorder ([place]) 03/12/2013  . Atrial fibrillation ([place]) 03/12/2013  . Artificial cardiac pacemaker 03/12/2013  . CCF (congestive cardiac failure) ([place]) 03/12/2013  . Chronic kidney disease (CKD), stage III (moderate) 03/12/2013  . Gastric reflux 03/12/2013  . 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 Big thyroid 03/12/2013  . H/O Hodgkin's lymphoma 03/12/2013  . BP (high blood pressure) 03/12/2013  . Fitting and adjustment of insulin pump 03/12/2013  . Disorder of bone and cartilage, unspecified 03/12/2013  . Aortic heart valve narrowing 03/12/2013  . Encounter for fitting or adjustment of insulin pump 03/12/2013  . Lipoma of neck  09/23/2012  . Pacemaker 09/08/2012  . Chronic kidney disease, stage 3/cardiorenal syndrome 08/15/2012  . Atrial flutter ([place]) 07/23/2012  . Sick sinus syndrome ([place]) 07/21/2012  . Hyperplastic gastric polyp 06/23/2012  . Gastroparesis due to DM ([place]) 05/01/2012  . Hypercalcemia   . Cholelithiasis 07/20/2011  . Aortic stenosis status post 19 mm Edwards pericardial valve replacement 2011   . Arteriosclerotic cardiovascular disease (ASCVD)   . Osteopenia   . Anemia, normocytic normochromic   . NASH (nonalcoholic steatohepatitis)   . Renal  cell carcinoma (HCC)     Class: History of  . Duodenal ulcer     Class: History of  . Ischemic colitis     Class: History of  . Gastroesophageal reflux disease   . Hodgkin's disease   . Hypertension   . Small bowel obstruction ([place])   . Hyperlipidemia 04/11/2010  . Diabetes mellitus type 2, controlled ([place]) 03/23/2010  . Colonic polyps, adenomatous 09/29/2009    Otelia Limes, PTA 07/04/2015, 10:48 AM  [name], [name], [address] Phone: [phone]   Fax:  [phone]  Name: BELKIS NORBECK MRN: [id number] Date of Birth: 1950/08/25

## 2015-07-08 ENCOUNTER — Encounter: Payer: Medicare Other | Admitting: Physical Therapy

## 2015-07-13 ENCOUNTER — Encounter: Payer: Medicare Other | Admitting: Physical Therapy

## 2015-07-15 ENCOUNTER — Encounter: Payer: Medicare Other | Admitting: Physical Therapy

## 2015-07-19 ENCOUNTER — Other Ambulatory Visit: Payer: Self-pay | Admitting: Pharmacist

## 2015-07-19 ENCOUNTER — Ambulatory Visit: Payer: Medicare Other

## 2015-07-19 DIAGNOSIS — E785 Hyperlipidemia, unspecified: Secondary | ICD-10-CM

## 2015-07-19 DIAGNOSIS — R609 Edema, unspecified: Secondary | ICD-10-CM

## 2015-07-19 DIAGNOSIS — M25512 Pain in left shoulder: Secondary | ICD-10-CM

## 2015-07-19 DIAGNOSIS — R293 Abnormal posture: Secondary | ICD-10-CM

## 2015-07-19 DIAGNOSIS — M25612 Stiffness of left shoulder, not elsewhere classified: Secondary | ICD-10-CM

## 2015-07-19 MED ORDER — ALIROCUMAB 75 MG/ML ~~LOC~~ SOPN: 75.0000 mg | PEN_INJECTOR | SUBCUTANEOUS | Status: DC

## 2015-07-19 NOTE — Therapy (Signed)
[place], [place], [address] Phone: [phone]   Fax:  [phone]  Physical Therapy Treatment  Patient Details  Name: KEIARRA CHARON MRN: [id number] Date of Birth: 1951/03/31 Referring Provider: Donne Hazel   Encounter Date: 07/19/2015      PT End of Session - 07/19/15 1243    Visit Number 10   Number of Visits 24   Date for PT Re-Evaluation 08/10/15   PT Start Time 0802   PT Stop Time 4315   PT Time Calculation (min) 45 min   Activity Tolerance Patient tolerated treatment well   Behavior During Therapy [facility] for tasks assessed/performed      Past Medical History  Diagnosis Date  . Aortic stenosis     a. bicuspid aortic valve; mean gradient of 20 mmHg in 2/10; b. s/p bioprosth AVR (19-mm Edwards pericardial valve) with a redo coronary artery bypass graft procedure in 07/2009; postoperative Dressler's syndrome    . Coronary artery disease     a. initial CABG with SVG-RCA in 12/05. b. redo SVG-RCA and bioprosthetic AVR in 1/11. d. 9/14 - PCI with DES to distal LM/proximal LAD was done rather than bypass as she would have been a poor candidate for redo sternotomy. d. S/p DES to prox LM 05/2014.  Marland Kitchen Chronic diastolic CHF (congestive heart failure) ([place])     a. 9.2014 EchoP EF 60-65%, no rwma, bioprosth AVR, mean gradient of 65mHg, mild to mod MR, PASP 564mg.  . Marland Kitchenyperlipidemia   . Hypertension   . Small bowel obstruction (HCC)     Recurrent; resolved after resection of a lipoma  . Osteopenia      hip on DEXA in October 2007.  . Marland Kitchennemia   . Carcinoma, renal cell (HCSalinas11/2008    Laparoscopic right nephrectomy  . NASH (nonalcoholic steatohepatitis) 1999    -biopsy in 1999  . Diabetes mellitus 03/2010     TYPE II: Hemoglobin A1c of 7.4 in  03/2010; 8.4 in 06/2010; treated with insulin pump  . Migraines   . Duodenal ulcer     Remote; H. pylori positive  . Colitis, ischemic (HCBlack Earth2008  .  Gastroesophageal reflux disease     ? of GASTROPARESIS; IRRITABLE BOWEL SYNDROME  . Hodgkin's disease (HCWhittier1991    [place] radiation therapy  . Statin intolerance   . Hypercalcemia     Ca-11.1 in 07/2011  . Vitamin D deficiency   . Costochondritis   . Insomnia   . Iron deficiency anemia     a. 03/2013 EGD: essentially normal. Possible slow GIB.  . Marland Kitchenastroparesis due to DM (HCTrego10/04/2012  . Hyperplastic gastric polyp 06/23/2012  . Arthritis   . Osteoporosis   . Pacemaker 08/21/2012    Medtronic Adapta L dual-chamber pacemaker-Dr. TaLovena Le. Anxiety   . Syncope     a. H/o recurrent syncope with pauses on loop recorder s/p Medtronic pacemaker 07/2012.  . CKD (chronic kidney disease), stage II   . PAF (paroxysmal atrial fibrillation) (HCKane    a.  h/o brief episodes noted on ppm interrogation. b. given GI bleeding and need to be on both ASA 81 and Brilinta, risks likely outweigh benefits of anticoagulation.   . Marland Kitchenysrhythmia     sick sinus syndrome  . Heart murmur   . Hodgkin's disease (HCGlenmora    hx  . PONV (postoperative nausea and vomiting)     1. last surg Valve  suction caused sore on inner  gum rt side, 2 ." be careful with neck tilt have had whiplash few times, 3. Hypotension    Past Surgical History  Procedure Laterality Date  . Total abdominal hysterectomy w/ bilateral salpingoophorectomy  2000  . Breast excisional biopsy      benign, bilateral  . Nephrectomy  2008    Laparoscopic right nephrectomy, renal cell carcinoma  . Coronary artery bypass graft  2005, 2011    CABG-most recent SVG to RCA-12/05; RCA occluded with patent graft in 2006; Redo bypass in 07/2009  . Tumor excision  1981    removal of Hodgkins lymphoma  . Exploratory laparotomy w/ bowel resection      Small bowel resection of lipoma  . Aortic valve replacement  2011    Aortic valve replacement surgery with a 19 mm bioprosthetic device post redo CABG January 2011.  . Colonoscopy      Multiple with adenomatous  polyps  . Cervical biopsy    . Liver biopsy    . Bone marrow biopsy    . Esophagogastroduodenoscopy    . Esophagogastroduodenoscopy  06/23/2012    Procedure: ESOPHAGOGASTRODUODENOSCOPY (EGD);  Surgeon: Gatha Mayer, MD;  Location: [facility];  Service: Endoscopy;  Laterality: N/A;  . Colonoscopy  06/23/2012    Procedure: COLONOSCOPY;  Surgeon: Gatha Mayer, MD;  Location: [facility] ENDOSCOPY;  Service: Endoscopy;  Laterality: N/A;  [name] dilation  06/23/2012    Procedure: [name] DILATION;  Surgeon: Gatha Mayer, MD;  Location: [facility] ENDOSCOPY;  Service: Endoscopy;;  54 fr  . Pacemaker insertion  08/21/2012    Medtronic Adapta L dual-chamber pacemaker  . Esophagogastroduodenoscopy N/A 04/06/2013    Procedure: ESOPHAGOGASTRODUODENOSCOPY (EGD);  Surgeon: Irene Shipper, MD;  Location: [facility] ENDOSCOPY;  Service: Endoscopy;  Laterality: N/A;  . Givens capsule study N/A 04/30/2013    Procedure: GIVENS CAPSULE STUDY;  Surgeon: Gatha Mayer, MD;  Location: [place];  Service: Endoscopy;  Laterality: N/A;  . Loop recorder implant N/A 12/10/2011    Procedure: LOOP RECORDER IMPLANT;  Surgeon: Evans Lance, MD;  Location: [facility] CATH LAB;  Service: Cardiovascular;  Laterality: N/A;  . Permanent pacemaker insertion N/A 08/21/2012    Procedure: PERMANENT PACEMAKER INSERTION;  Surgeon: Evans Lance, MD;  Location: [facility] CATH LAB;  Service: Cardiovascular;  Laterality: N/A;  . Left and right heart catheterization with coronary angiogram N/A 04/07/2013    Procedure: LEFT AND RIGHT HEART CATHETERIZATION WITH CORONARY ANGIOGRAM;  Surgeon: Larey Dresser, MD;  Location: [facility] CATH LAB;  Service: Cardiovascular;  Laterality: N/A;  . Percutaneous coronary stent intervention (pci-s) N/A 04/09/2013    Procedure: PERCUTANEOUS CORONARY STENT INTERVENTION (PCI-S);  Surgeon: Burnell Blanks, MD;  Location: [facility] CATH LAB;  Service: Cardiovascular;  Laterality: N/A;  . Left heart catheterization with coronary/graft angiogram  N/A 06/07/2014    Procedure: LEFT HEART CATHETERIZATION WITH Beatrix Fetters;  Surgeon: Burnell Blanks, MD;  Location: [facility] CATH LAB;  Service: Cardiovascular;  Laterality: N/A;  . Mastectomy Left 05/17/2015    total   . Total mastectomy Left 05/17/2015    Procedure: LEFT TOTAL MASTECTOMY;  Surgeon: Rolm Bookbinder, MD;  Location: [place];  Service: General;  Laterality: Left;    There were no vitals filed for this visit.  Visit Diagnosis:  Left shoulder pain  Shoulder joint stiffness, left  Abnormal posture  Postoperative edema      Subjective Assessment - 07/19/15 0805    Subjective I'm doing so much bette! Going to make  today my last visit. Reaching the top shelf in my cabinets and my ROM is so much better. I can tolerate touching my scar now, I just feel so much better.    Currently in Pain? No/denies            [facility] PT Assessment - 07/19/15 0001    AROM   Left Shoulder Flexion 127 Degrees   Left Shoulder ABduction 116 Degrees              Quick Dash - 07/19/15 0001    Open a tight or new jar Mild difficulty   Do heavy household chores (wash walls, wash floors) Moderate difficulty   Carry a shopping bag or briefcase No difficulty   Wash your back No difficulty   Use a knife to cut food No difficulty   Recreational activities in which you take some force or impact through your arm, shoulder, or hand (golf, hammering, tennis) Mild difficulty   During the past week, to what extent has your arm, shoulder or hand problem interfered with your normal social activities with family, friends, neighbors, or groups? Slightly   During the past week, to what extent has your arm, shoulder or hand problem limited your work or other regular daily activities Not at all   Arm, shoulder, or hand pain. None   Tingling (pins and needles) in your arm, shoulder, or hand Mild   Difficulty Sleeping No difficulty   DASH Score 13.64 %               [facility] Adult  PT Treatment/Exercise - 07/19/15 0001    Manual Therapy   Myofascial Release To Lt axilla throughout PROM at Lt pectoralis insertion to tolerance   Manual Lymphatic Drainage (MLD) Lt inguinal nodes, Lt axillo-inguinal anastomosis and focused on Lt trunk inferior to incision reviewing with pt how to self perform   Passive ROM In supine to Lt shoulder into flexion, abduction and D2 to pts end ROM.                PT Education - 07/19/15 1248    Education provided Yes   Education Details Scar massage and reviewed self manual lymph drainage at Rt anterior trunk inferior to incision   Person(s) Educated Patient   Methods Explanation   Comprehension Verbalized understanding;Returned demonstration           Short Term Clinic Goals - 07/04/15 1043    CC Short Term Goal  #1   Title pt will reports a decrease in pain by 50% so that she is able to do household activities with less pain    Status Achieved   CC Short Term Goal  #2   Title Pt will have 90 degrees of left shoudler abduction so that she can dress herself with greater ease    Status Achieved   CC Short Term Goal  #3   Title Pt will report a decrease in postoperative edema by 50% so she has less discomfort with activites of daily living   About 25%, I can lay on that side now and rest my arm on that side when I'm laying on my Rt.   Status On-going             Long Term Clinic Goals - 07/19/15 1246    CC Long Term Goal  #1   Title Pt will have 120 degrees of left shoulder flexion so that she can put dishes up in a cabinet  127 degrees attained today 07/19/15   Status Achieved   CC Long Term Goal  #2   Title pt will report that she has 75 % less pain so that she can enjoy activities outside of her home    Status Achieved   CC Long Term Goal  #3   Title Pt will be independent in a home exercise program so that she can continue improvement on her own at home    Status Achieved   CC Long Term Goal  #4   Title Pt  will decrease Quick DASH score to < 38 demonstrating a improvement in function of left arm   13.64 scored on last visit   Status Achieved            Plan - 07/19/15 1243    Clinical Impression Statement Pt reported ready to discharge today and feels she has made great improvements. She has met all goals and has done very well with therapy.   Pt will benefit from skilled therapeutic intervention in order to improve on the following deficits Decreased skin integrity;Pain;Postural dysfunction;Decreased range of motion;Decreased strength;Impaired UE functional use;Impaired perceived functional ability;Increased edema   Rehab Potential Excellent   Clinical Impairments Affecting Rehab Potential multiple allergies, poor pain control, strong cardiac history, type 1 DM    PT Frequency 3x / week   PT Duration 8 weeks   PT Treatment/Interventions Manual techniques;Manual lymph drainage   PT Next Visit Plan Discharge visit.   Consulted and Agree with Plan of Care Patient        Problem List Patient Active Problem List   Diagnosis Date Noted  . Hx of radiation therapy 04/23/2015  . Thrombocytopenia ([place]) 04/23/2015  . 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 Osteopenia determined by x-ray 04/23/2015  . DM (diabetes mellitus) with complications ([place]) 37/90/2409  . Radiation therapy complication 73/53/2992  . History of nodular sclerosis Hodgkin's disease 10/27/2014  . Iron deficiency anemia due to chronic blood loss 10/27/2014  . Screening mammogram for high-risk patient 10/27/2014  . Insulin dependent type 1 diabetes mellitus ([address]) 10/07/2014  . Claudication ([place]) 07/29/2014  . PAF (paroxysmal atrial fibrillation) ([place])   . CKD (chronic kidney disease), stage II   . Coronary artery disease involving native coronary artery of native heart with unstable angina pectoris ([place])   . Type 2 diabetes mellitus  with complication ([place])   . Abdominal bloating 06/09/2013  . Feeling bilious 06/09/2013  . Iron deficiency anemia 04/11/2013  . Unstable angina ([place]) 04/11/2013  . CAD (coronary artery disease) of artery bypass graft 04/03/2013  . Chronic diastolic CHF (congestive heart failure) ([place]) 04/03/2013  . Anemia 04/03/2013  . Arterial vascular disease 03/12/2013  . Aorta disorder ([place]) 03/12/2013  . Atrial fibrillation ([place]) 03/12/2013  . Artificial cardiac pacemaker 03/12/2013  . CCF (congestive cardiac failure) ([place]) 03/12/2013  . Chronic kidney disease (CKD), stage III (moderate) 03/12/2013  . Gastric reflux 03/12/2013  . 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 Big thyroid 03/12/2013  . H/O Hodgkin's lymphoma 03/12/2013  . BP (high blood pressure) 03/12/2013  . Fitting and adjustment of insulin pump 03/12/2013  . Disorder of bone and cartilage, unspecified 03/12/2013  . Aortic heart valve narrowing 03/12/2013  . Encounter for fitting or adjustment of insulin pump 03/12/2013  . Lipoma of neck 09/23/2012  . Pacemaker 09/08/2012  . Chronic kidney disease, stage 3/cardiorenal syndrome 08/15/2012  . Atrial flutter (  [place]) 07/23/2012  . Sick sinus syndrome ([place]) 07/21/2012  . Hyperplastic gastric polyp 06/23/2012  . Gastroparesis due to DM ([place]) 05/01/2012  . Hypercalcemia   . Cholelithiasis 07/20/2011  . Aortic stenosis status post 19 mm Edwards pericardial valve replacement 2011   . Arteriosclerotic cardiovascular disease (ASCVD)   . Osteopenia   . Anemia, normocytic normochromic   . NASH (nonalcoholic steatohepatitis)   . Renal cell carcinoma (HCC)     Class: History of  . Duodenal ulcer     Class: History of  . Ischemic colitis     Class: History of  . Gastroesophageal reflux disease   . Hodgkin's disease   . Hypertension   . Small bowel obstruction ([place])   . Hyperlipidemia 04/11/2010  . Diabetes mellitus type 2, controlled ([place]) 03/23/2010  . Colonic polyps, adenomatous 09/29/2009     Otelia Limes, PTA 07/19/2015, 12:50 PM   PHYSICAL THERAPY DISCHARGE SUMMARY  Visits from Start of Care: 10  Current functional level related to goals / functional outcomes: As above   Remaining deficits: Some discomfort when lying on side   Education / Equipment: Self manual lymph drainage and exercise  Plan: Patient agrees to discharge.  Patient goals were partially met. Patient is being discharged due to being pleased with the current functional level.  ?????       Maudry Diego, PT 07/21/2015 5:18 PM   [place], [place], [address] Phone: [phone]   Fax:  [phone]  Name: ATHINA FAHEY MRN: [id number] Date of Birth: 10/17/1950

## 2015-07-21 ENCOUNTER — Other Ambulatory Visit: Payer: Self-pay

## 2015-07-21 ENCOUNTER — Encounter: Payer: Medicare Other | Admitting: Physical Therapy

## 2015-07-21 DIAGNOSIS — D509 Iron deficiency anemia, unspecified: Secondary | ICD-10-CM

## 2015-07-21 MED ORDER — HEMATINIC PLUS COMPLEX 106-1 MG PO TABS: 1.0000 | ORAL_TABLET | Freq: Every day | ORAL | Status: DC

## 2015-07-21 MED ORDER — HEMATINIC PLUS COMPLEX 106-1 MG PO TABS: ORAL_TABLET | ORAL | Status: DC

## 2015-08-04 ENCOUNTER — Telehealth: Payer: Self-pay | Admitting: Cardiology

## 2015-08-04 ENCOUNTER — Ambulatory Visit (INDEPENDENT_AMBULATORY_CARE_PROVIDER_SITE_OTHER): Payer: Medicare Other | Admitting: *Deleted

## 2015-08-04 DIAGNOSIS — I495 Sick sinus syndrome: Secondary | ICD-10-CM

## 2015-08-04 NOTE — Telephone Encounter (Signed)
LMOVM reminding pt to send remote transmission.   

## 2015-08-08 NOTE — Progress Notes (Signed)
Remote pacemaker transmission.   

## 2015-08-12 LAB — CUP PACEART REMOTE DEVICE CHECK
Battery Remaining Longevity: 156 mo
Battery Voltage: 2.8 V
Brady Statistic AP VP Percent: 0 %
Brady Statistic AS VS Percent: 100 %
Implantable Lead Implant Date: 20140130
Implantable Lead Implant Date: 20140130
Implantable Lead Model: 5076
Lead Channel Setting Pacing Amplitude: 1.5 V
Lead Channel Setting Pacing Amplitude: 2.5 V
Lead Channel Setting Sensing Sensitivity: 5.6 mV
MDC IDC LEAD LOCATION: 753859
MDC IDC LEAD LOCATION: 753860
MDC IDC MSMT BATTERY IMPEDANCE: 161 Ohm
MDC IDC MSMT LEADCHNL RA IMPEDANCE VALUE: 487 Ohm
MDC IDC MSMT LEADCHNL RV IMPEDANCE VALUE: 674 Ohm
MDC IDC SESS DTM: 20170112183037
MDC IDC SET LEADCHNL RV PACING PULSEWIDTH: 0.4 ms
MDC IDC STAT BRADY AP VS PERCENT: 0 %
MDC IDC STAT BRADY AS VP PERCENT: 0 %

## 2015-08-19 ENCOUNTER — Telehealth: Payer: Self-pay | Admitting: Cardiology

## 2015-08-19 ENCOUNTER — Encounter: Payer: Self-pay | Admitting: Cardiology

## 2015-08-19 NOTE — Telephone Encounter (Signed)
Pt called in and scheduled one year follow up w/ GT for 11-09-15 at 8:30 AM at the [facility] office. Pt stated that she received her remote PPM results and she wants to know further details about the Abnormal reading. Informed pt that all device techs were with other pts. Pt said that it is ok to call her back at [phone].

## 2015-08-19 NOTE — Telephone Encounter (Signed)
Returned pt's call. Explained that the "abnormal" result was due to her PAF seen on the transmission. This is not a new finding. Dr. Lovena Le and Dr. Aundra Dubin are following it closely since she is not anticoagulated due to taking brilinta. She is appreciative of explanation. No further questions.

## 2015-08-29 ENCOUNTER — Telehealth: Payer: Self-pay | Admitting: Cardiology

## 2015-08-29 DIAGNOSIS — I480 Paroxysmal atrial fibrillation: Secondary | ICD-10-CM

## 2015-08-29 DIAGNOSIS — I5032 Chronic diastolic (congestive) heart failure: Secondary | ICD-10-CM

## 2015-08-29 MED ORDER — LISINOPRIL 5 MG PO TABS: 5.0000 mg | ORAL_TABLET | Freq: Every day | ORAL | Status: DC

## 2015-08-29 NOTE — Telephone Encounter (Signed)
Pt advised, verbalized understanding, BMET scheduled for 09/15/15.

## 2015-08-29 NOTE — Telephone Encounter (Signed)
New message      Pt c/o BP issue: STAT if pt c/o blurred vision, one-sided weakness or slurred speech  1. What are your last 5 BP readings? Jan 23 8:40am 147/52; jan 24 8:05 pm 118/62; jan 25 9am 145/81and 10:15pm 129/80; jan 26 8:50am 138/69 and 2:05pm 142/78 and 10:07pm 111/62; jan 27 5:05pm 131/70 and 9:45pm  137/68; jan 28 7:40am 148/77 and 10:43pm 143/73; jan 29 9am 147/72 and 10:02 142/77; jan 30 9:30am 136/75 and 5:18pm 145/83; jan 31 8am 144/77 and 10:26pm 141/86;  Feb 1 9:30am 137/79 and 9:40pm 146/87; feb 2 8:35am 146/82 and 10:44pm 116/82; feb 3 8:04am 142/75 and 10:15pm 134/72; feb 4 11:15am 120/72 and 9:40 pm 128/75; feb 5 10:30am 135/73 and 4:15pm 113/68 and11:25pm 149/80; feb 6 8am 132/75 and 2:07 139/78 2. Are you having any other symptoms (ex. Dizziness, headache, blurred vision, passed out)?  Headaches (off and on) for the past 2 weeks  3. What is your BP issue? Calling to give bp readings

## 2015-08-29 NOTE — Telephone Encounter (Signed)
I will forward to Dr McLean for review 

## 2015-08-29 NOTE — Telephone Encounter (Signed)
LMTCB

## 2015-08-29 NOTE — Telephone Encounter (Signed)
BP a bit elevated at times.  I do not see where she has problems with ACEIs.  Would suggest low dose lisinopril 5 mg daily with BMET in 2 wks.

## 2015-09-04 ENCOUNTER — Encounter: Payer: Self-pay | Admitting: Cardiology

## 2015-09-05 ENCOUNTER — Telehealth: Payer: Self-pay | Admitting: *Deleted

## 2015-09-05 NOTE — Telephone Encounter (Signed)
-----   Message from Larey Dresser, MD sent at 09/05/2015 12:42 PM EST ----- Please call Mrs Yebra about increasing her lisinopril to 10 mg daily.  She will need BMET on Friday or Monday.

## 2015-09-05 NOTE — Telephone Encounter (Signed)
PT AWARE OF MED  CHANGES  AND  THE  NEED FOR LAB  .[name] [name]

## 2015-09-05 NOTE — Telephone Encounter (Signed)
LM TO CALL BACK ./CY 

## 2015-09-09 ENCOUNTER — Other Ambulatory Visit (INDEPENDENT_AMBULATORY_CARE_PROVIDER_SITE_OTHER): Payer: Medicare Other | Admitting: *Deleted

## 2015-09-09 ENCOUNTER — Telehealth: Payer: Self-pay | Admitting: Cardiology

## 2015-09-09 DIAGNOSIS — I5032 Chronic diastolic (congestive) heart failure: Secondary | ICD-10-CM

## 2015-09-09 DIAGNOSIS — I480 Paroxysmal atrial fibrillation: Secondary | ICD-10-CM

## 2015-09-09 LAB — BASIC METABOLIC PANEL
BUN: 40 mg/dL — AB (ref 7–25)
CO2: 30 mmol/L (ref 20–31)
CREATININE: 1.15 mg/dL — AB (ref 0.50–0.99)
Calcium: 9.6 mg/dL (ref 8.6–10.4)
Chloride: 100 mmol/L (ref 98–110)
GLUCOSE: 179 mg/dL — AB (ref 65–99)
POTASSIUM: 4.1 mmol/L (ref 3.5–5.3)
Sodium: 141 mmol/L (ref 135–146)

## 2015-09-09 NOTE — Telephone Encounter (Signed)
Not related to BP med.  May be easy bruising from Brilinta.

## 2015-09-09 NOTE — Telephone Encounter (Signed)
Agree more like Brilinta in combination with her low platelets that makes her more susceptible to bruising.

## 2015-09-09 NOTE — Telephone Encounter (Signed)
The area was apx 3 inch by 3 inch.  Very light reddish pink area of broken capillaries spideiring out.  No warmth or swelling.  Gave her instructions to watch for any warmth, redness, swelling or spreading

## 2015-09-09 NOTE — Telephone Encounter (Signed)
New Message:  Pt called in stating that she woke up this morning with some red "blood -like" sploches on the base of her shin. She isnt sure if it a reaction from the new BP med and the Blood thinner she is taking but this is new and she is concerned. Please f/u with her

## 2015-09-09 NOTE — Telephone Encounter (Signed)
Patient woke up this morning with brusie palm size on lower shin Patient is concerned that it is from increasing her BP medication She is on Brilinta and Lisinopril was recently increased Explained to patient that it was not unsual to bruise easily with Brilinta  Patient is coming by for labs today and would like someone to look at it. I told her when she comes by today I would look at it and I would also send her concerns to Dr. Aundra Dubin and the pharmacist

## 2015-09-12 NOTE — Telephone Encounter (Signed)
Notified the pt that per [name], her bruising is not related to her BP med, it may be easily bruising from [place].  Pt verbalized understanding.

## 2015-09-15 ENCOUNTER — Other Ambulatory Visit: Payer: Medicare Other

## 2015-09-21 ENCOUNTER — Encounter: Payer: Self-pay | Admitting: Internal Medicine

## 2015-09-23 ENCOUNTER — Other Ambulatory Visit: Payer: Self-pay | Admitting: Oncology

## 2015-09-23 ENCOUNTER — Telehealth: Payer: Self-pay | Admitting: Pharmacist

## 2015-09-23 ENCOUNTER — Telehealth: Payer: Self-pay | Admitting: Oncology

## 2015-09-23 NOTE — Telephone Encounter (Signed)
Spoke with patient to confirm appt change per LL 3/3 pof from 3/23 to 3/27. Appt time per pt

## 2015-09-23 NOTE — Telephone Encounter (Signed)
New message      Pt states that she is having trouble with her praluent injection.  She gave it to herself on last Monday,  She is having local irritation and possible a reaction.  Please advise

## 2015-09-23 NOTE — Telephone Encounter (Signed)
Pt has been on Praluent for 1 year and 2 months. Injections have been fine 75% of the time. 25% of the time she has a quarter sized red area that's itchy for 2 or 3 days. This past Monday, her injection left a small red spot that has grown and has been spreading across her stomach. She states there was a hard knot that was ping pong sized. It has turned to a purple/yellow color and is warm to the touch. No itching, no issues breathing. Doubt it is an allergic reaction since she has been on Praluent for over a year. Pt also emailed a picture of the reaction - it looks very much like a bruise. Pt likely nicked a blood vessel during last injection. She's also on Brilinta making her more likely to bruise. Advised her that if the reactions are overly bothersome, we could try switching her to [place]atha. However, it does not seem like an allergic reaction. She verbalized understanding and will continue on Praluent for now.

## 2015-10-07 ENCOUNTER — Telehealth: Payer: Self-pay | Admitting: Cardiology

## 2015-10-07 NOTE — Telephone Encounter (Signed)
Spoke with pt, her weight is down from 127 to 121.8 this week. She was not able to keep anything down yesterday. She did not take her furosemide yesterday and the okay was given for her to hold the furosemide today as well. She was encouraged to try to take the rest of her medications today. She reports on her hands seem a little puffy today. She will restart the furosemide tomorrow.

## 2015-10-07 NOTE — Telephone Encounter (Signed)
New Message:   Pt have been sick with diarrhea and vomiting for 2 days. She feels better today.She wants to know about whether she should take her Lasix today.

## 2015-10-10 ENCOUNTER — Other Ambulatory Visit: Payer: Self-pay

## 2015-10-10 DIAGNOSIS — D649 Anemia, unspecified: Secondary | ICD-10-CM

## 2015-10-10 NOTE — Progress Notes (Unsigned)
[name] [name] is going to come in tomorrow for a CBC, hold tube for blood bank , and a TSH.  She has been feeling extremely cold in her hands, fingers, and toes the last week. She is lethargic. She just wants to be in bed and stay warm. She a;so will have the TSH checked and sshe received radiation in the 1980's in the neck area and thyroid issues can be related to feeling cold. She will wait for the results of the cbc.

## 2015-10-11 ENCOUNTER — Other Ambulatory Visit (HOSPITAL_BASED_OUTPATIENT_CLINIC_OR_DEPARTMENT_OTHER): Payer: Medicare Other

## 2015-10-11 ENCOUNTER — Telehealth: Payer: Self-pay

## 2015-10-11 DIAGNOSIS — E039 Hypothyroidism, unspecified: Secondary | ICD-10-CM

## 2015-10-11 DIAGNOSIS — D649 Anemia, unspecified: Secondary | ICD-10-CM

## 2015-10-11 LAB — CBC WITH DIFFERENTIAL/PLATELET
BASO%: 1 % (ref 0.0–2.0)
BASOS ABS: 0.1 10*3/uL (ref 0.0–0.1)
EOS ABS: 0.1 10*3/uL (ref 0.0–0.5)
EOS%: 1 % (ref 0.0–7.0)
HCT: 39.9 % (ref 34.8–46.6)
HEMOGLOBIN: 13 g/dL (ref 11.6–15.9)
LYMPH%: 17.5 % (ref 14.0–49.7)
MCH: 29.8 pg (ref 25.1–34.0)
MCHC: 32.5 g/dL (ref 31.5–36.0)
MCV: 91.5 fL (ref 79.5–101.0)
MONO#: 0.6 10*3/uL (ref 0.1–0.9)
MONO%: 5.3 % (ref 0.0–14.0)
NEUT#: 8.8 10*3/uL — ABNORMAL HIGH (ref 1.5–6.5)
NEUT%: 75.2 % (ref 38.4–76.8)
Platelets: 117 10*3/uL — ABNORMAL LOW (ref 145–400)
RBC: 4.36 10*6/uL (ref 3.70–5.45)
RDW: 14 % (ref 11.2–14.5)
WBC: 11.7 10*3/uL — ABNORMAL HIGH (ref 3.9–10.3)
lymph#: 2 10*3/uL (ref 0.9–3.3)

## 2015-10-11 LAB — TSH: TSH: 1.553 m[IU]/L (ref 0.308–3.960)

## 2015-10-11 NOTE — Telephone Encounter (Signed)
Ms. Meder Endocrinologist is Dr. Carin Primrose at [place] Fax: [phone]

## 2015-10-11 NOTE — Telephone Encounter (Signed)
Spoke with [name] [name]  And told her that her hgb was good at 13.0 Cancelled Hold clot tube. She is to see Dr. Marko Plume 10-17-15. Her hands, fingers, and toes are not as cold as they have been.  Gave her a copy of her CBC from today.  TSH pending.  She is not not any Thyroid medication but has received radiation in the area of the thyroid in the 1980's.

## 2015-10-11 NOTE — Telephone Encounter (Signed)
Spoke with [name] [name] and told her that Dr. Marko Plume said that the TSH and CBC looked good.  Hgb is good  and platelets in range that she has  had previously, WBC a little elevated. Sent copies to Dr.  Hilma Favors (PCP) and Endocrinologist Dr. Frederik Pear. Dr. Marko Plume will repeat CBC on 10-17-15. [name] [name] verbalized understanding.

## 2015-10-13 ENCOUNTER — Other Ambulatory Visit: Payer: Medicare Other

## 2015-10-13 ENCOUNTER — Ambulatory Visit: Payer: Medicare Other | Admitting: Oncology

## 2015-10-14 ENCOUNTER — Other Ambulatory Visit: Payer: Self-pay | Admitting: Cardiology

## 2015-10-14 ENCOUNTER — Telehealth: Payer: Self-pay | Admitting: *Deleted

## 2015-10-14 NOTE — Telephone Encounter (Signed)
**Note De-Identified [name] Obfuscation** The pt is advised and she verbalized understanding and is in agreement with the plan.

## 2015-10-14 NOTE — Telephone Encounter (Signed)
NEw MEssage  Pt requesting to speak w/rN on how to move forward w/ Lisinopril. Please call back and discuss.

## 2015-10-14 NOTE — Telephone Encounter (Signed)
1.  The pt states that since 08/29/15, when her Lisinopril was increased to 10 mg daily, her hands and feet are "extremly" cold, she has no energy, weakness and fatigue. She states that she feels "off" and has no concentration. She wants to know if these are side effects off lisinopril.  2.  Also, she is scheduled to see [name] on Thursday 3/31 (next week) and she states that she will run out of her 30 day supply of Lisinopril 10 mg on Tuesday. She is requesting to wait for response from [name] to refill because she reports her BP has been running around 100/65 and 111/72 and is not sure if this is the correct dose for her.  Please advise.

## 2015-10-14 NOTE — Telephone Encounter (Signed)
Cut lisinopril to 5 mg daily.

## 2015-10-16 ENCOUNTER — Other Ambulatory Visit: Payer: Self-pay | Admitting: Cardiology

## 2015-10-16 ENCOUNTER — Other Ambulatory Visit: Payer: Self-pay | Admitting: Oncology

## 2015-10-16 DIAGNOSIS — C641 Malignant neoplasm of right kidney, except renal pelvis: Secondary | ICD-10-CM

## 2015-10-16 DIAGNOSIS — D696 Thrombocytopenia, unspecified: Secondary | ICD-10-CM

## 2015-10-16 DIAGNOSIS — D509 Iron deficiency anemia, unspecified: Secondary | ICD-10-CM

## 2015-10-16 DIAGNOSIS — D649 Anemia, unspecified: Secondary | ICD-10-CM

## 2015-10-17 ENCOUNTER — Telehealth: Payer: Self-pay | Admitting: Oncology

## 2015-10-17 ENCOUNTER — Other Ambulatory Visit (HOSPITAL_BASED_OUTPATIENT_CLINIC_OR_DEPARTMENT_OTHER): Payer: Medicare Other

## 2015-10-17 ENCOUNTER — Ambulatory Visit (HOSPITAL_BASED_OUTPATIENT_CLINIC_OR_DEPARTMENT_OTHER): Payer: Medicare Other | Admitting: Oncology

## 2015-10-17 ENCOUNTER — Encounter: Payer: Self-pay | Admitting: Oncology

## 2015-10-17 VITALS — BP 118/48 | HR 79 | Temp 97.6°F | Resp 18 | Ht 62.5 in | Wt 129.4 lb

## 2015-10-17 DIAGNOSIS — D0512 Intraductal carcinoma in situ of left breast: Secondary | ICD-10-CM

## 2015-10-17 DIAGNOSIS — C641 Malignant neoplasm of right kidney, except renal pelvis: Secondary | ICD-10-CM

## 2015-10-17 DIAGNOSIS — D649 Anemia, unspecified: Secondary | ICD-10-CM

## 2015-10-17 DIAGNOSIS — E039 Hypothyroidism, unspecified: Secondary | ICD-10-CM | POA: Diagnosis not present

## 2015-10-17 DIAGNOSIS — Z8571 Personal history of Hodgkin lymphoma: Secondary | ICD-10-CM

## 2015-10-17 DIAGNOSIS — Z85528 Personal history of other malignant neoplasm of kidney: Secondary | ICD-10-CM

## 2015-10-17 DIAGNOSIS — D696 Thrombocytopenia, unspecified: Secondary | ICD-10-CM

## 2015-10-17 DIAGNOSIS — D509 Iron deficiency anemia, unspecified: Secondary | ICD-10-CM

## 2015-10-17 DIAGNOSIS — K760 Fatty (change of) liver, not elsewhere classified: Secondary | ICD-10-CM

## 2015-10-17 DIAGNOSIS — Z9012 Acquired absence of left breast and nipple: Secondary | ICD-10-CM

## 2015-10-17 LAB — COMPREHENSIVE METABOLIC PANEL
ALT: 32 U/L (ref 0–55)
ANION GAP: 12 meq/L — AB (ref 3–11)
AST: 24 U/L (ref 5–34)
Albumin: 3.8 g/dL (ref 3.5–5.0)
Alkaline Phosphatase: 133 U/L (ref 40–150)
BUN: 42.6 mg/dL — ABNORMAL HIGH (ref 7.0–26.0)
CHLORIDE: 103 meq/L (ref 98–109)
CO2: 27 meq/L (ref 22–29)
Calcium: 9.9 mg/dL (ref 8.4–10.4)
Creatinine: 1.4 mg/dL — ABNORMAL HIGH (ref 0.6–1.1)
EGFR: 40 mL/min/{1.73_m2} — AB (ref >90)
Glucose: 266 mg/dl — ABNORMAL HIGH (ref 70–140)
POTASSIUM: 4.4 meq/L (ref 3.5–5.1)
Sodium: 141 mEq/L (ref 136–145)
Total Bilirubin: 0.4 mg/dL (ref 0.20–1.20)
Total Protein: 7.5 g/dL (ref 6.4–8.3)

## 2015-10-17 LAB — CBC WITH DIFFERENTIAL/PLATELET
BASO%: 0.9 % (ref 0.0–2.0)
Basophils Absolute: 0.1 10*3/uL (ref 0.0–0.1)
EOS ABS: 0.2 10*3/uL (ref 0.0–0.5)
EOS%: 1.6 % (ref 0.0–7.0)
HCT: 37.9 % (ref 34.8–46.6)
HEMOGLOBIN: 12.5 g/dL (ref 11.6–15.9)
LYMPH%: 19.4 % (ref 14.0–49.7)
MCH: 30.3 pg (ref 25.1–34.0)
MCHC: 33.1 g/dL (ref 31.5–36.0)
MCV: 91.6 fL (ref 79.5–101.0)
MONO#: 0.7 10*3/uL (ref 0.1–0.9)
MONO%: 6.6 % (ref 0.0–14.0)
NEUT%: 71.5 % (ref 38.4–76.8)
NEUTROS ABS: 7.7 10*3/uL — AB (ref 1.5–6.5)
PLATELETS: 102 10*3/uL — AB (ref 145–400)
RBC: 4.13 10*6/uL (ref 3.70–5.45)
RDW: 13.8 % (ref 11.2–14.5)
WBC: 10.7 10*3/uL — AB (ref 3.9–10.3)
lymph#: 2.1 10*3/uL (ref 0.9–3.3)

## 2015-10-17 LAB — URINALYSIS, MICROSCOPIC - CHCC
BILIRUBIN (URINE): NEGATIVE
BLOOD: NEGATIVE
Glucose: 500 mg/dL
Ketones: NEGATIVE mg/dL
Leukocyte Esterase: NEGATIVE
NITRITE: NEGATIVE
Protein: NEGATIVE mg/dL
RBC / HPF: NEGATIVE (ref 0–2)
Specific Gravity, Urine: 1.01 (ref 1.003–1.035)
Urobilinogen, UR: 0.2 mg/dL (ref 0.2–1)
pH: 6 (ref 4.6–8.0)

## 2015-10-17 LAB — MORPHOLOGY
PLT EST: DECREASED
RBC Comments: NORMAL

## 2015-10-17 LAB — IRON AND TIBC
%SAT: 38 % (ref 21–57)
IRON: 93 ug/dL (ref 41–142)
TIBC: 248 ug/dL (ref 236–444)
UIBC: 154 ug/dL (ref 120–384)

## 2015-10-17 NOTE — Progress Notes (Signed)
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 She is on observation for the DCIS since mastectomy. She is on Brilinta for cardiac stent, and is on oral iron.   Patient felt generally more weak and noted cold hands and feet last week after increase in lisinopril, with labs done at this office 10-11-15 with hgb 13 (WBC 11.7 and plt 117k). Lisinopril was decreased by [name], with improvement in the other symptoms.  She had gastroenteritis symptoms 2 weeks ago, with diarrhea and vomiting x 2 days, then felt drained afterwards. She saw some "black specks" in vomitus with that.  She is on zyrtec for blepharitis/ scleritis by ophthalmologist, improving.  She still has soft fullness below left mastectomy scar, previous large seroma or hematoma there post op. This is particularly uncomfortable when she wears mastectomy bra. Next scheduled appointment with [name] is several months. Last imaging was [name] left axilla 04-08-15, not remarkable (last right mammogram in this EMR was at [facility] 02-22-14, will contact [facility] to see if more recent available, otherwise needs right mammogram done).   She has pacemaker, so cannot have breast MRI. No PAC Has not had genetics testing. 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 She has radiation induced hypothyroidism and the treatment caused or worsened coronary  artery disease. She has had no further active Hodgkins.   Renal Cell Carcinoma: She had right nephrectomy for renal cell carcinoma 2008: T1bNx chromophobe variant Fuhrman Nuclear Grade III-IV with negative margins, no renal vein invasion, no extension thru capsule at right nephrectomy 06-11-2007 by [name], path [id number]. She is no longer followed regularly by urology.   DCIS left breast identified on screening to[facility]ograms done [facility] 03-14-15, with heterogeneously dense breast tissue, heterogeneous calcification left breast at 3:00 posterior and otherwise no findings of concern. She had left diagnostic mammogram at [facility] 03-16-15 with suspicious grouped calcifications identified. She had tomo stereotactic needle core biopsy by [name] at [facility] on 03-21-14, seven cores submitted, pathology DCIS with necrosis, grade 2, ER PR negative [facility] [phone]). She had [name] left axilla at [facility] 03-29-15 with no evidence of malignancy. After input from general surgery, radiation oncology, cardiology and medical oncology, she had left total mastectomy by [name] on 05-17-15. Pathology [id number]) multiple foci grade 3 DCIS largest area 1 cm, without necrosis, closest margin anterior 68m.    Significant PMH: She has had intermittent GI bleeding in past 2 years. Brilinta and ASA were not held for that bleeding; she continues ferrous fumarate, last iron studies in 10-2014 had serum iron 68 and %sat 23. She has had some variable mildly low platelets in past year.  [name] and for hypothyroidism on replacement (hypothyroid also related to remote mantle radiation for Hodgkins). Diabetes is also complicated by gastroparesis.  [facility], no mention of spleen  Variable mild thrombocytopenia. CT angio pelvis 09-2014 "normal  spleen"   Objective:  Vital signs in last 24 hours:  BP 118/48 mmHg  Pulse 79  Temp(Src) 97.6 F (36.4 C) (Oral)  Resp 18  Ht 5' 2.5" (1.588 m)  Wt 129 lb 6.4 oz (58.695 kg)  BMI 23.28 kg/m2  SpO2 100% Weight up 6 lbs from Nov. Alert, oriented and appropriate. Ambulatory without difficulty. Respirations not labored. Anxious.  HEENT:PERRL, sclerae not icteric. Oral mucosa moist without lesions, posterior pharynx clear.  Neck supple. No JVD.  Lymphatics:no cervical,supraclavicular, axillary or inguinal adenopathy Resp: clear to auscultation bilaterally and normal percussion bilaterally Cardio: regular rate and rhythm. No gallop. GI: soft, nontender, not distended, no mass or organomegaly cannot appreciate spleen. A few bowel sounds. Insulin pump on. Musculoskeletal/ Extremities: without pitting edema, cords, tenderness Neuro: nonfocal   PSYCH somewhat anxious seems understandably related to all medical issues Skin without rash, ecchymosis, petechiae Breasts: Left mastectomy scar well healed. Soft fullness inferior to scar moreso laterally, no erythema or tenderness. Nothing left axilla, no swelling LUE. Right breast without dominant mass, skin or nipple findings and right axilla benign.   Lab Results:  Results for orders placed or performed in visit on 10/17/15  CBC with Differential  Result Value Ref Range   WBC 10.7 (H) 3.9 - 10.3 10e3/uL   NEUT# 7.7 (H) 1.5 - 6.5 10e3/uL   HGB 12.5 11.6 - 15.9 g/dL   HCT 37.9 34.8 - 46.6 %   Platelets 102 (L) 145 - 400 10e3/uL   MCV 91.6 79.5 - 101.0 fL   MCH 30.3 25.1 - 34.0 pg   MCHC 33.1 31.5 - 36.0 g/dL   RBC 4.13 3.70 - 5.45 10e6/uL   RDW 13.8 11.2 - 14.5 %   lymph# 2.1 0.9 - 3.3 10e3/uL   MONO# 0.7 0.1 - 0.9 10e3/uL   Eosinophils Absolute 0.2 0.0 - 0.5 10e3/uL   Basophils Absolute 0.1 0.0 - 0.1 10e3/uL   NEUT% 71.5 38.4 - 76.8 %   LYMPH% 19.4 14.0 - 49.7 %   MONO% 6.6 0.0 - 14.0 %   EOS% 1.6 0.0 - 7.0 %   BASO% 0.9 0.0 -  2.0 %  Morphology  Result Value Ref Range   RBC Comments Within Normal Limits Within Normal Limits   White Cell Comments C/W auto diff    PLT EST Decreased Adequate   Platelet Morphology Large Platelets Within Normal Limits  Urinalysis with microscopic  Result Value Ref Range   Glucose 500 Negative mg/dL   Bilirubin (Urine) Negative Negative   Ketones Negative Negative mg/dL   Specific Gravity, Urine 1.010 1.003 - 1.035   Blood Negative Negative   pH 6.0 4.6 - 8.0   Protein Negative Negative- <30 mg/dL   Urobilinogen, UR 0.2 0.2 - 1 mg/dL   Nitrite Negative Negative   Leukocyte Esterase Negative Negative   RBC / HPF Negative 0 - 2   WBC, UA 0-2 0 - 2   Bacteria, UA Trace Negative- Trace   Epithelial Cells Few Negative- Few  Iron and TIBC  Result Value Ref Range   Iron 93 41 - 142 ug/dL   TIBC 248 236 - 444 ug/dL   UIBC 154 120 - 384 ug/dL   %SAT 38 21 - 57 %  Comprehensive metabolic panel  Result Value Ref Range   Sodium 141 136 - 145 mEq/L   Potassium 4.4 3.5 - 5.1 mEq/L  Chloride 103 98 - 109 mEq/L   CO2 27 22 - 29 mEq/L   Glucose 266 (H) 70 - 140 mg/dl   BUN 42.6 (H) 7.0 - 26.0 mg/dL   Creatinine 1.4 (H) 0.6 - 1.1 mg/dL   Total Bilirubin 0.40 0.20 - 1.20 mg/dL   Alkaline Phosphatase 133 40 - 150 U/L   AST 24 5 - 34 U/L   ALT 32 0 - 55 U/L   Total Protein 7.5 6.4 - 8.3 g/dL   Albumin 3.8 3.5 - 5.0 g/dL   Calcium 9.9 8.4 - 10.4 mg/dL   Anion Gap 12 (H) 3 - 11 mEq/L   EGFR 40 (L) >90 ml/min/1.73 m2   *Note: Due to a large number of results and/or encounters for the requested time period, some results have not been displayed. A complete set of results can be found in Results Review.     Studies/Results:  No results found.  [name] left mastectomy area ordered, and [name] for spleen size.  Medications: I have reviewed the patient's current medications.  DISCUSSION  Mild thrombocytopenia: no bleeding, some large platelets seen on smear review generally indicating more  young platelets which are the most active. WIll get [name] to check spleen size. Rest of counts good, which does not suggest primary marrow problem. She is on multiple medications, all of which have been carefully and deliberately chosen for significant comorbid conditions, so not easy to hold these to see effects on platelets etc. Has not had bone marrow exam, which I do not think would change interventions at this point. Note if ITP, steroids would be difficult with her insulin dependent DM.   Hemoglobin stable, iron studies returned after visit also in good range, should be able to decrease oral iron  Discussed soft fullness beneath left mastectomy may be residual from post op seroma/ hematoma vs fatty tissue, not clear that lymphedema PT would improve that tho may be consideration. Will get US of the left mastectomy area to see if fluid collection, tho may not want to try to drain a sterile collection in her if so given anticoagulation and infection risk. NOTE after visit [name] order reentered for [facility].  NOTE also following visit, not clear that she is up to date on right mammogram at [facility] from information in this EMR, will follow up with [facility]   Assessment/Plan:  1.DCIS left breast: found on screening mammogram, ER PR negative. Post left total mastectomy 05-17-15, which really was accomplished well given comorbid conditions. Follow with observation from this office. As this DCIS was likely caused result of remote mantle radiation, she is at risk also in remaining right breast, which will be followed closely. [name] left mastectomy area SEE ABOVE.  I will see her back in 6-8 weeks/  3.variable, mild thrombocytopenia for at least a year, possibly medication related. No apparent bleeding now, count a little lower today could be with recent viral gastroenteritis. Repeat counts with visit in 6-8 weeks 4.previous anemia related to intermittent slow GI bleeding, iron studies good now. Colonoscopy 06-2012 no  polyps, thought to have had ischemic colitis 2014. NOTE needs lasix if PRBCs. 5.history of CAD s/p CABG in 12/05 and redo in 1/11, bioprosthetic AVR, diastolic CHF, and sick sinus syndrome s/p PCM. DES from left main into proximal LAD 03-2013; re-developed angina in 11/15 with DES to ostial LM. Last echo 11/15 EF 55-60%, normal bioprosthetic aortic valve, mild-moderate MR, moderate TR.On Brilinta and ASA as poor responder to plavix previously. Paroxysmal A  fib evaluated at ED 05-28-15. Followed closely by [name]. 6.IIA nodular sclerosing Hodgkins treated with mantle radiation in 1980s, not recurrent. Radiation likely pertinent to hypothyroidism, CAD and  breast cancer. 7.type II diabetes on insulin pump: followed by Dr Frederik Pear at [facility], on insulin pump. Diabetic gastroparesis. 8.Carotid stenosis: Carotid dopplers (2/15) with 40-59% bilateral stenosis. Carotid dopplers (3/16) with 40-59% bilateral ICA stenosis.  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 EGD 05-2013 no varices.  10..post right nephrectomy for renal cell ca 2008. She is followed yearly by Dr Lorrene Reid of nephrology, but stable without other concerns at visit 03-2015. 11. Hypothyroidism on replacement, also followed by Dr Frederik Pear. 12.osteopenia by bone density scan [facility] 11-23-14 13.post TAH BSO 14.flu vaccine 04-18-15.   All questions answered and she knows to call if needed prior to next scheduled visit. We will certainly let her know if concerns from US spleen and [name] mastectomy area prior. Time spent 25 min including >50% counseling and coordination of care. CC  PCP and Drs Donne Hazel, Lorrene Reid, [place], Spratt outside of this system EMR.    LIVESAY,LENNIS P, MD   10/17/2015, 6:00 PM

## 2015-10-17 NOTE — Telephone Encounter (Signed)
Gave patient avs report and appointments for May 2017 and US- Breast for 10/18/15 @ 11:15 am at [facility]. Central radiology scheduling will contact patient re US-Abd - patient aware. Message to LL to change location on US-abd order.

## 2015-10-18 ENCOUNTER — Inpatient Hospital Stay (HOSPITAL_COMMUNITY): Admission: RE | Admit: 2015-10-18 | Payer: Medicare Other | Source: Ambulatory Visit

## 2015-10-18 ENCOUNTER — Ambulatory Visit (HOSPITAL_COMMUNITY)
Admission: RE | Admit: 2015-10-18 | Discharge: 2015-10-18 | Disposition: A | Discharge status: Home / Self Care | Payer: Medicare Other | Source: Ambulatory Visit | Attending: Internal Medicine | Admitting: Internal Medicine

## 2015-10-18 DIAGNOSIS — E1122 Type 2 diabetes mellitus with diabetic chronic kidney disease: Secondary | ICD-10-CM | POA: Insufficient documentation

## 2015-10-18 DIAGNOSIS — I6523 Occlusion and stenosis of bilateral carotid arteries: Secondary | ICD-10-CM | POA: Insufficient documentation

## 2015-10-18 DIAGNOSIS — I5032 Chronic diastolic (congestive) heart failure: Secondary | ICD-10-CM | POA: Insufficient documentation

## 2015-10-18 DIAGNOSIS — I130 Hypertensive heart and chronic kidney disease with heart failure and stage 1 through stage 4 chronic kidney disease, or unspecified chronic kidney disease: Secondary | ICD-10-CM | POA: Insufficient documentation

## 2015-10-18 DIAGNOSIS — N183 Chronic kidney disease, stage 3 (moderate): Secondary | ICD-10-CM | POA: Diagnosis not present

## 2015-10-18 DIAGNOSIS — K219 Gastro-esophageal reflux disease without esophagitis: Secondary | ICD-10-CM | POA: Insufficient documentation

## 2015-10-18 DIAGNOSIS — E785 Hyperlipidemia, unspecified: Secondary | ICD-10-CM | POA: Diagnosis not present

## 2015-10-21 ENCOUNTER — Encounter: Payer: Self-pay | Admitting: Cardiology

## 2015-10-21 ENCOUNTER — Ambulatory Visit (INDEPENDENT_AMBULATORY_CARE_PROVIDER_SITE_OTHER): Payer: Medicare Other | Admitting: Cardiology

## 2015-10-21 VITALS — BP 122/64 | HR 82

## 2015-10-21 DIAGNOSIS — I5032 Chronic diastolic (congestive) heart failure: Secondary | ICD-10-CM | POA: Diagnosis not present

## 2015-10-21 DIAGNOSIS — I4891 Unspecified atrial fibrillation: Secondary | ICD-10-CM | POA: Diagnosis not present

## 2015-10-21 DIAGNOSIS — E785 Hyperlipidemia, unspecified: Secondary | ICD-10-CM

## 2015-10-21 DIAGNOSIS — I25700 Atherosclerosis of coronary artery bypass graft(s), unspecified, with unstable angina pectoris: Secondary | ICD-10-CM

## 2015-10-21 DIAGNOSIS — N183 Chronic kidney disease, stage 3 unspecified: Secondary | ICD-10-CM

## 2015-10-21 DIAGNOSIS — I350 Nonrheumatic aortic (valve) stenosis: Secondary | ICD-10-CM | POA: Diagnosis not present

## 2015-10-21 DIAGNOSIS — I480 Paroxysmal atrial fibrillation: Secondary | ICD-10-CM

## 2015-10-21 DIAGNOSIS — I495 Sick sinus syndrome: Secondary | ICD-10-CM

## 2015-10-21 NOTE — Patient Instructions (Addendum)
Medication Instructions:  Stop lisinopril  Labwork:  Your physician recommends that you return for a FASTING lipid profile /BMET in 2 weeks. DO not eat or drink after midnight the night before your appointment. The lab is open from 7:30AM-5PM.    Testing/Procedures: None   Follow-Up: Your physician recommends that you schedule a follow-up appointment in: 3 months with [name].    Any Other Special Instructions Will Be Listed Below (If Applicable).  You have been referred to Dr Thompson Grayer for evaluation for Watchman Device.   Your physician has requested that you regularly monitor and record your blood pressure readings at home. Please use the same machine at the same time of day to check your readings and record them. I will call you in about 2 weeks to get the readings.     If you need a refill on your cardiac medications before your next appointment, please call your pharmacy.

## 2015-10-23 ENCOUNTER — Encounter: Payer: Self-pay | Admitting: Cardiology

## 2015-10-23 ENCOUNTER — Other Ambulatory Visit: Payer: Self-pay | Admitting: Oncology

## 2015-10-23 DIAGNOSIS — Z9012 Acquired absence of left breast and nipple: Secondary | ICD-10-CM

## 2015-10-23 DIAGNOSIS — D0512 Intraductal carcinoma in situ of left breast: Secondary | ICD-10-CM

## 2015-10-23 NOTE — Progress Notes (Signed)
Patient ID: [name] [name], female   DOB: 08/26/1950, 65 y.o.   MRN: [id number] PCP: Dr. Hilma Favors Oncologist: Dr Marko Plume  65 yo with history of CAD s/p CABG in 12/05 and redo in 1/11, bioprosthetic AVR, diastolic CHF, and sick sinus syndrome s/p PCM presents for cardiology followup.  She had initial CABG with SVG-RCA in 12/05 followed by redo SVG-RCA and bioprosthetic AVR in 1/11.  She has had diastolic CHF and is on Lasix.  In 9/14, she had a CPX test.  This showed severe functional limitation with ischemic ECG changes (inferolateral ST depression and ST elevation in V1 and V2). She had chest pain.  I took her for [facility] in 9/14.  This showed elevated left and right heart filling pressures, patent SVG-PDA, and 80% distal LM/80% ostial LAD stenosis.  Patient was deemed not a candidate for redo CABG (had 2 prior sternotomies as well as chest radiation for Hodgkins lymphoma).  She had DES from left main into proximal LAD and will need long-term DAPT => Brilinta was used as she has been a poor responder to Plavix in the past.  She re-developed angina in 11/15 and was admitted with unstable angina.  LHC showed 80% ostial LM and patent SVG-RCA. She had DES to ostial LM. Last echo in 11/15 showed EF 55-60%, normal bioprosthetic aortic valve, mild-moderate MR, moderate TR.    She has chronic iron deficiency anemia thought to be due to a slow GI bleed.  Last EGD was unremarkable and a capsule endoscopy was also unremarkable.  She has had periodic transfusions; However, recently hemoglobin has been stable.  No BRBPR.   She had bilateral mastectomy for DCIS in 40/98 without complication.  She has an insulin pump followed by an endocrinologist at [facility].   Given myalgias with statins, she was started on Praluent.  LDL is now excellent.    She has short runs of atrial fibrillation.  No prolonged episodes (beyond an hour or two) that she has felt.  She is not anticoagulated.   She had an episode of sharp chest pain  that woke her up last night.  Otherwise, she has had no chest pain.  I recently started her on lisinopril for elevated BP.  She has developed significant fatigue since starting lisinopril. No energy. Thinks it is due to this medication.  Creatinine also has been elevated.  Same pattern of exertional dyspnea that she has had long-term (dyspnea with stairs, inclines). No tachypalpitations.   Labs (7/14): hemoglobin 11 Labs (9/14): K 3.9, creatinine 1.48, hemoglobin 7.3 Labs (10/14): hemoglobin 7.7 Labs (11/14): K 3.8, creatinine 1.5 Labs (12/14): HCT 37.7 Labs (1/15): LDL 106, HDL 41, K 3.9, creatinine 1.6, LFTs normal Labs (2/15): K 3.7, creatinine 1.1, BNP 71 Labs (3/15): HCT 36.7 Labs (4/15): K 3.7, creatinine 1.1, HCT 35.5 Labs (7/15): HCT 36.4 Labs (11/15): HCT 32.1, K 4.2, creatinine 1.06 Labs (12/15): hgb 12 Labs (1/16): HCT 37.1 Labs (3/16): LDL 25, HDL 42, elevated transaminases Labs (6/16): HCT 39.6, plts 146 Labs (7/16): LDL 59, HDL 41, AST 34, ALT 49, AP 125 Labs (11/16): HCT 38.8, alkaline phosphatase 156, AST/ALT normal, K 4, creatinine 1.0 Labs (3/17): K 4.4, creatinine 1.4, LFTs normal, hgb 12.5  ECG: NSR, 1st degree AV block 334 msec  PMH: 1. Sick sinus syndrome s/p [employer] PCM.  2. HTN 3. H/o SBO 4. Renal cell carcinoma s/p right nephrectomy in 2008.  5. 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  Fibroscan in 11/14 showed advanced fibrosis concerning  for cirrhosis. EGD in 11/14 showed no varices.  6. Diastolic CHF: Echo (2/54) with EF 60-65%, bioprosthetic aortic valve with mean gradient 15 mmHg, mild MR.  CPX (9/14): peak VO2 10.4, VE/VCO2 43.8, inferolateral ST depression and ST elevation in V1/V2 with severe dyspnea and chest pain, severe functional limitation with ischemic changes.  Echo (9/14) with EF 60-65%, normal RV size and systolic function, mild-moderate MR, bioprosthetic aortic valve normal, PA systolic pressure 51 mmHg, no evidence for pericardial constriction  though exam incomplete for constriction.  Echo (11/15) with EF 55-60%, bioprosthetic aortic valve, mild-moderate MR, moderate TR, PA systolic pressure 42 mmHg.  7. TAH-BSO 8. Type II diabetes: Has insulin pump.  9. H/o ischemic colitis. 10. H/o PUD. 11. Diabetic gastroparesis. 12. Hyperlipidemia: Myalgias with Crestor and atorvastatin, elevated LFTs with simvastatin.  Myalgias with > 10 mg pravastatin.  13. CKD 14. Bicuspid aortic valve s/p 19 mm Edwards pericardial valve in 1/11 (had post-operative Dresslers syndrome).   15. CAD: CABG with SVG-RCA in 12/05.  Redo SVG-RCA in 1/11 with AVR.  LHC/RHC (9/14) with mean RA 18, PA 62/25 mean 43, mean PCWP 26, CI 2.8, 70-80% distal LM stenosis, 80% ostial LAD stenosis, total occlusion RCA, SVG-PDA patent.  Patient had DES LM into LAD.  Plan to continue long-term ASA/Brilinta (poor responder to Plavix).  ETT-cardiolite (4/15) with 6' exercise, EF 60%, ST depression in recovery and mild chest pain, no ischemia or infarction by perfusion images.  Unstable angina 11/15 with 80% LM, patent SVG-RCA => DES to ostial LM.   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  17. Anemia: Iron-deficiency.  Suspect chronic GI blood loss.  EGD in 9/14 was unremarkable.  Capsule endoscopy 10/14 was unremarkable.  Has required periodic blood transfusions.  18. Atrial fibrillation: Paroxysmal, noted by [facility] interrogation.  19. Carotid stenosis: Carotid dopplers (2/15) with 40-59% bilateral stenosis. Carotid dopplers (3/16) with 40-59% bilateral ICA stenosis. Carotid dopplers (3/17) with 40-59% bilateral ICA stenosis.  20. 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   SH: Married, lives in [place], nonsmoker.   FH: CAD  ROS: All systems reviewed and negative except as per HPI.   Current Outpatient Prescriptions   Medication Sig Dispense Refill  . acyclovir cream (ZOVIRAX) 5 % 1 application as needed for cold sores    . Alirocumab 75 MG/ML SOPN Inject 75 mg into the skin every 14 (fourteen) days. 2 pen 11  . ALPRAZolam (XANAX) 0.5 MG tablet Take 0.5 mg by mouth at bedtime. May take additional dose daily for anxiety    . APIDRA 100 UNIT/ML injection INFUSE THROUGH INSULIN PUMP UTD. Has been receiving 82-125 units daily over last 10 days.  Asked pt to bring with her on admission 10/25  2  . aspirin EC 81 MG tablet Take 81 mg by mouth at bedtime.    Marland Kitchen azithromycin (ZITHROMAX) 500 MG tablet 1 tablet by mouth one hour prior to dental appointment/procedure (Patient taking differently: Take 500 mg by mouth See admin instructions. 1 tablet by mouth one hour prior to dental appointment/procedure) 2 tablet 0  . Calcium Carbonate-Vitamin D (RA CALCIUM PLUS VITAMIN D) 600-400 MG-UNIT per tablet Take 1 tablet by mouth 2 (two) times daily.     . cetirizine (ZYRTEC) 10 MG tablet Take 1 tablet by mouth daily.    . Cholecalciferol 2000 UNITS TABS Take 1 tablet by mouth  daily.    . clobetasol (TEMOVATE) 0.05 % external solution Apply 1 application topically daily as needed (for hives).     Marland Kitchen desoximetasone (TOPICORT) 0.05 % cream Apply 1 application topically 2 (two) times daily as needed. When skin starts peeling    . diclofenac sodium (VOLTAREN) 1 % GEL Apply topically as needed (for body aches).     . docusate sodium (COLACE) 100 MG capsule Take 100 mg by mouth 2 (two) times daily.    . Fe Fum-FA-B Cmp-C-Zn-Mg-Mn-Cu (HEMATINIC PLUS COMPLEX) 106-1 MG TABS Take 1 tablet daily with with a Vitamin C tablet 90 each 3  . furosemide (LASIX) 40 MG tablet Take 40 mg by mouth daily as needed for fluid (if weight increases >= 2 lb).    . furosemide (LASIX) 80 MG tablet TAKE 1 TABLET BY MOUTH DAILY 90 tablet 1  . glucagon (GLUCAGON EMERGENCY) 1 MG injection Inject 1 mg into the vein once as needed (for severe reaction).    .  hydrocortisone (ANUSOL-HC) 25 MG suppository Place 25 mg rectally 2 (two) times daily.    . isosorbide mononitrate (IMDUR) 60 MG 24 hr tablet TAKE 1 TABLET BY MOUTH ONCE DAILY 90 tablet 2  . Magnesium Oxide (MAG-OXIDE PO) Take 250 mg by mouth daily.    . metoprolol (LOPRESSOR) 50 MG tablet Take 1.5 tablets (75 mg total) by mouth 2 (two) times daily. 270 tablet 6  . Multiple Vitamins-Minerals (MULTIVITAMIN WITH MINERALS) tablet Take 1 tablet by mouth daily.      . Naphazoline-Pheniramine (VISINE-A OP) Apply 1 drop to eye 4 (four) times daily. Both eyes    . nitroGLYCERIN (NITROLINGUAL) 0.4 MG/SPRAY spray Place 1 spray under the tongue every 5 (five) minutes as needed for chest pain. 12 g 1  . omeprazole (PRILOSEC) 20 MG capsule Take 20 mg by mouth daily.    . ONE TOUCH ULTRA TEST test strip 1 each by Other route as needed for other.     . potassium chloride SA (K-DUR,KLOR-CON) 20 MEQ tablet Take 40 mEq by mouth every morning.    . pravastatin (PRAVACHOL) 10 MG tablet TAKE 1 TABLET BY MOUTH ONCE DAILY 90 tablet 1  . ticagrelor (BRILINTA) 60 MG TABS tablet Take 60 mg by mouth 2 (two) times daily.    . valACYclovir (VALTREX) 1000 MG tablet Take 1,000 mg by mouth 3 (three) times daily. X 7 days for cold sores    . vitamin C (ASCORBIC ACID) 250 MG tablet Take 250 mg by mouth 2 (two) times daily.    . Wheat Dextrin (BENEFIBER PO) Take 45 mLs by mouth every morning. 3 tablespoons each morning     No current facility-administered medications for this visit.    BP 122/64 mmHg  Pulse 82 General: NAD Neck: JVP 7 cm, no thyromegaly or thyroid nodule.  Lungs: Clear to auscultation bilaterally with normal respiratory effort. CV: Nondisplaced PMI.  Heart regular S1/S2, no S3/S4, 1/6 early systolic murmur RUSB.  No peripheral edema.  Left carotid bruit.  2+ PT pulses bilaterally.  Abdomen: Soft, nontender, no hepatosplenomegaly, no distention.  Skin: Intact without lesions or rashes.  Neurologic: Alert and  oriented x 3.  Psych: Normal affect. Extremities: No clubbing or cyanosis.   Assessment/Plan: 1. CAD: s/p SVG-RCA in 12/05, then redo SVG-RCA in 1/11.  She was found to have distal left main/proximal LAD severe stenosis in 9/14.  She was very symptomatic.  PCI with DES to distal LM/proximal LAD was done rather than  bypass as she would have been a poor candidate for redo sternotomy (2 prior sternotomies and history of chest wall radiation for Hodgkins disease).  Recurrent unstable angina with 80% ostial LM treated with DES in 11/15.  No chest pain since that time.  She will need to be on Brilinta and ASA (poor responder to plavix) long-term.  This has been complicated by suspected slow GI bleed. Hemoglobin stable recently.  - Brilinta now decreased to 60 mg bid.  - Continue ASA 81, statin, metoprolol, Imdur without changes.    2. Bioprosthetic aortic valve replacement: Stable on last echo.  3. Diastolic CHF: Euvolemic on exam, EF normal on 11/15 echo. Continue current Lasix. 4. CKD:  Patient has a single kidney s/p right nephrectomy.  She sees nephrology. Creatinine higher recently, will stop lisinopril. 5. Hyperlipidemia: She has not been able to tolerate any higher potency statin than pravastatin 10 mg daily.  She is now on Praluent with excellent LDL.     6. Anemia: Suspected slow GI bleed.  Unremarkable EGD and capsule endoscopy in fall 2014.  Unfortunately, needs to stay on Brilinta long-term (Plavix non-responder).  Will need to follow closely (sees hematology), plan for transfusion with hemoglobin < 8 as this allows best control of her symptoms. Recently, hemoglobin has been more stable.   7. Atrial fibrillation: Paroxysmal, brief episodes noted on PCM interrogation.  Given GI bleeding and need to be on both ASA 81 and Brilinta, risks likely outweigh benefits of anticoagulation.   - She tends to have short atrial fibrillation episodes.  She is not a good candidate for Ic agents.  She is not a  good Multaq candidate with CHF.  If she needs an antiarrhythmic, consider Tikosyn or Sotalol (though long 1st degree AVB makes these less appealing).  - I am going to send her to see Dr Rayann Heman for consideration of Watchman placement.   8. Carotid bruit: Mild to moderate bilateral stenosis.  Repeat in 3/18. 9. SSS: Pacemaker in place.  10. HTN: She has been more fatigued since starting lisinopril.  I will stop lisinopril.  She will check BP daily x 2 weeks and we will call to get numbers.  If BP running high, would use amlodipine.    [name] 10/23/2015

## 2015-10-24 ENCOUNTER — Encounter: Payer: Self-pay | Admitting: Oncology

## 2015-10-24 ENCOUNTER — Other Ambulatory Visit: Payer: Self-pay | Admitting: Pharmacist

## 2015-10-24 MED ORDER — ALIROCUMAB 75 MG/ML ~~LOC~~ SOPN: 75.0000 mg | PEN_INJECTOR | SUBCUTANEOUS | Status: DC

## 2015-10-24 NOTE — Progress Notes (Signed)
Medical Oncology  Per Teola Bradley most recent mammogram 10-18-15. Copy of report to be faxed and will be entered into this EMR  L.Marko Plume  MD

## 2015-10-26 ENCOUNTER — Ambulatory Visit (HOSPITAL_COMMUNITY)
Admission: RE | Admit: 2015-10-26 | Discharge: 2015-10-26 | Disposition: A | Discharge status: Home / Self Care | Payer: Medicare Other | Source: Ambulatory Visit | Attending: Oncology | Admitting: Oncology

## 2015-10-26 DIAGNOSIS — K760 Fatty (change of) liver, not elsewhere classified: Secondary | ICD-10-CM

## 2015-10-26 DIAGNOSIS — D696 Thrombocytopenia, unspecified: Secondary | ICD-10-CM

## 2015-10-27 ENCOUNTER — Encounter: Payer: Self-pay | Admitting: Oncology

## 2015-10-27 ENCOUNTER — Telehealth: Payer: Self-pay

## 2015-10-27 NOTE — Telephone Encounter (Signed)
Discussed results of US Abdomen and [name] left mastectomy as noted below by Dr.Livesay. Ms. Tout verbalized understanding. Ms. Mooneyhan is working with [employer] to find a good fitting/comfortable bra.

## 2015-10-27 NOTE — Telephone Encounter (Signed)
Patient Demographics     Patient Name Sex DOB SSN Address Phone    [name] [name], [name] [name] Female February 03, 1951 [id number] [place] [address] [phone] (Home) *Preferred* [phone] (Mobile)      Message  Received: Today    [name], MD  Baruch Merl, RN           See separate report note re abd [name] also   Please let her know I received report of [name] left mastectomy area done at [facility] 10-18-15, with nothing of concern from cancer standpoint, no fluid, just normal fatty soft tissue.  Suggest she see one of the mastectomy bra specialists for something that is more comfortable on that side   thanks

## 2015-10-27 NOTE — Telephone Encounter (Signed)
-----   Message from [name], MD sent at 10/26/2015  8:04 PM EDT ----- Labs seen and need follow up: please let her know spleen is not enlarged, in fact looks stable compared to 2013.  I have not seen breast US report from [facility] yet, but we will let her know that information when available.

## 2015-10-27 NOTE — Progress Notes (Signed)
Medical Oncology  Report of [name] left mastectomy area done at [facility] 10-18-15 received and will be scanned into EMR. Fatty tissue only.  Godfrey Pick, MD

## 2015-10-27 NOTE — Progress Notes (Signed)
MEDICAL ONCOLOGY  Report of most recent right mammogram requested from [facility]. Received report of 3D Bilateral dated 03-14-15, to be scanned into this EMR  L.Marko Plume, MD

## 2015-11-01 ENCOUNTER — Telehealth: Payer: Self-pay | Admitting: *Deleted

## 2015-11-01 NOTE — Telephone Encounter (Signed)
BP sometimes mildly elevated but would hold off on additional med for now.

## 2015-11-01 NOTE — Telephone Encounter (Signed)
Copied from Dr Claris Gladden 10/21/15 note:  HTN: She has been more fatigued since starting lisinopril. I will stop lisinopril. She will check BP daily x 2 weeks and we will call to get numbers. If BP running high, would use amlodipine.    Pt reports that she has not taken lisinopril since 10/20/15.  BP and HR readings: 10/21/15 103/69 PM 10/22/15  122/71 117/71 PM   HR 90 PM 10/23/15  128/67  138/81 PM   HR 81/94 PM 10/24/15 140/77   128/81 PM   HR 87/89 PM 10/25/15 124/71   131/76 PM   HR 82/99 PM 10/26/15 131/76    138/77 PM  HR 81/98 PM 10/27/15 145/78  None PM      HR  80  10/28/15 135/74  138/77 PM    HR  87/88 PM 10/29/15 133/71  156/90 PM    HR   80/81PM 10/30/15 142/73  143/77 PM    HR 77/84 PM 10/31/15 118/67  125/72 PM   HR 84/82 PM 11/01/15 144/74  71 AM  Pt states that her fatigue is much improved since she has been off lisinopril. Pt is scheduled for a lipid profile/BMET 11/04/15. Pt advised I will forward to [name] for review.

## 2015-11-04 ENCOUNTER — Other Ambulatory Visit (INDEPENDENT_AMBULATORY_CARE_PROVIDER_SITE_OTHER): Payer: Medicare Other | Admitting: *Deleted

## 2015-11-04 DIAGNOSIS — E785 Hyperlipidemia, unspecified: Secondary | ICD-10-CM

## 2015-11-04 DIAGNOSIS — I5032 Chronic diastolic (congestive) heart failure: Secondary | ICD-10-CM

## 2015-11-04 DIAGNOSIS — I350 Nonrheumatic aortic (valve) stenosis: Secondary | ICD-10-CM

## 2015-11-04 DIAGNOSIS — I4891 Unspecified atrial fibrillation: Secondary | ICD-10-CM | POA: Diagnosis not present

## 2015-11-04 LAB — LIPID PANEL
CHOLESTEROL: 115 mg/dL — AB (ref 125–200)
HDL: 41 mg/dL — AB (ref >=46)
LDL Cholesterol: 33 mg/dL (ref <130)
TRIGLYCERIDES: 207 mg/dL — AB (ref <150)
Total CHOL/HDL Ratio: 2.8 Ratio (ref <=5.0)
VLDL: 41 mg/dL — ABNORMAL HIGH (ref <30)

## 2015-11-04 LAB — BASIC METABOLIC PANEL
BUN: 30 mg/dL — AB (ref 7–25)
CALCIUM: 10.1 mg/dL (ref 8.6–10.4)
CO2: 31 mmol/L (ref 20–31)
CREATININE: 1 mg/dL — AB (ref 0.50–0.99)
Chloride: 103 mmol/L (ref 98–110)
Glucose, Bld: 121 mg/dL — ABNORMAL HIGH (ref 65–99)
Potassium: 3.9 mmol/L (ref 3.5–5.3)
Sodium: 143 mmol/L (ref 135–146)

## 2015-11-06 ENCOUNTER — Other Ambulatory Visit: Payer: Self-pay | Admitting: Oncology

## 2015-11-07 NOTE — Telephone Encounter (Signed)
The pt is advised and she is in agreement with plan.

## 2015-11-09 ENCOUNTER — Encounter: Payer: Self-pay | Admitting: Internal Medicine

## 2015-11-09 ENCOUNTER — Ambulatory Visit (INDEPENDENT_AMBULATORY_CARE_PROVIDER_SITE_OTHER): Payer: Medicare Other | Admitting: Internal Medicine

## 2015-11-09 VITALS — BP 118/62 | HR 79 | Ht 63.0 in | Wt 126.0 lb

## 2015-11-09 DIAGNOSIS — I480 Paroxysmal atrial fibrillation: Secondary | ICD-10-CM

## 2015-11-09 NOTE — Progress Notes (Signed)
HPI [name] returns today for followup. She is a very pleasant 65 year old woman with multiple medical problems including mantle radiation for Hodgkin's disease, and subsequent coronary artery disease status post bypass grafting. She develop recurrent chest pain and underwent catheterization several months ago demonstrating left main stenosis and underwent stenting of the left main and left anterior descending coronary arteries. 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 She has had breast surgery for breast CA. She has not had syncope. She denies medication noncompliance. No peripheral edema. No anginal symptoms.  Allergies  Allergen Reactions  . Cephalexin Swelling and Rash    Extremities swell , throat swelling Extremities swell   . Clindamycin Hcl Anaphylaxis and Shortness Of Breath  . Clindamycin/Lincomycin Anaphylaxis and Shortness Of Breath  . Humalog [Insulin Lispro] Hives and Itching    Pt uses Apidra   . Novolog [Insulin Aspart] Itching and Hives    Patient reports she has severe itching with Novolog insulin Patient reports she has severe itching with Novolog insulin  . Penicillins Anaphylaxis, Hives, Rash and Swelling    Childhood allergy  rash Childhood allergy   . Tizanidine Other (See Comments)    Other reaction(s): Hallucination, Other (See Comments) Hallucinate Hallucinate  . Crestor [Rosuvastatin] Nausea And Vomiting, Nausea Only and Other (See Comments)    Other reaction(s): Other (See Comments) Muscle and joint pain & increased LFTs; tolerates Pravastatin 30m (max)  . Gentamycin [Gentamicin] Hives, Itching and Rash    Fine red bumps.  Reaction noted post loop recorder implant, where gentamycin was used for irrigation. Topical prep  . Limonene Hives, Itching and Rash    Reaction noted post loop recorder implant, where gentamycin was used for irrigation. Topical prep  . Atorvastatin Other (See Comments)     increased LFT's,  no muscle weakness  . Avandia [Rosiglitazone Maleate]     Congestive heart failure  . Cholestyramine     Other reaction(s): Liver Disorder Elevated LFTs Elevated LFTs  . Codeine Nausea And Vomiting  . Metformin     Congestive heart failure  . Nickel Itching  . Plavix [Clopidogrel Bisulfate]     P2Y12 testing = 271 while on Plavix  . Pravastatin Other (See Comments)    In high doses (20-468m causes muscle and joint pain   . Ranitidine Nausea Only  . Simvastatin     Increased LFT's  . Tamiflu [Oseltamivir] Nausea And Vomiting  . Tramadol Nausea Only  . Erythromycin Nausea And Vomiting  . Iodinated Diagnostic Agents Other (See Comments)    PAIN DURING LYMPHANGIOGRAM '81, NO ALLERGY TO IV CONTRAST.  Has had procedures with Iodine since without problems.  . Latex Itching    I"if wear gloves hands get red ,itchy no prioblem if other wear and touch me"  . Neosporin [Neomycin-Bacitracin Zn-Polymyx] Rash    Redness, fine rash.  . Neomycin-Bacitracin-Polymyxin  [Bacitracin-Neomycin-Polymyxin] Rash     Current Outpatient Prescriptions  Medication Sig Dispense Refill  . acyclovir cream (ZOVIRAX) 5 % 1 application as needed for cold sores    . Alirocumab 75 MG/ML SOPN Inject 75 mg into the skin every 14 (fourteen) days. 2 pen 11  . ALPRAZolam (XANAX) 0.5 MG tablet Take 0.5 mg by mouth at bedtime. May take additional dose daily for anxiety    . APIDRA 100 UNIT/ML injection INFUSE THROUGH INSULIN PUMP UTD. Has been receiving 82-125 units daily over last 10 days.  Asked pt to bring with her  on admission 10/25  2  . aspirin EC 81 MG tablet Take 81 mg by mouth at bedtime.    Marland Kitchen azithromycin (ZITHROMAX) 500 MG tablet 1 tablet by mouth one hour prior to dental appointment/procedure (Patient taking differently: Take 500 mg by mouth See admin instructions. 1 tablet by mouth one hour prior to dental appointment/procedure) 2 tablet 0  . Calcium Carbonate-Vitamin D (RA CALCIUM PLUS VITAMIN D) 600-400  MG-UNIT per tablet Take 1 tablet by mouth 2 (two) times daily.     . cetirizine (ZYRTEC) 10 MG tablet Take 1 tablet by mouth daily.    . Cholecalciferol 2000 UNITS TABS Take 1 tablet by mouth daily.    . clobetasol (TEMOVATE) 0.05 % external solution Apply 1 application topically daily as needed (for hives).     Marland Kitchen desoximetasone (TOPICORT) 0.05 % cream Apply 1 application topically 2 (two) times daily as needed. When skin starts peeling    . diclofenac sodium (VOLTAREN) 1 % GEL Apply topically as needed (for body aches).     . docusate sodium (COLACE) 100 MG capsule Take 100 mg by mouth 2 (two) times daily.    . Fe Fum-FA-B Cmp-C-Zn-Mg-Mn-Cu (HEMATINIC PLUS COMPLEX) 106-1 MG TABS Take 1 tablet daily with with a Vitamin C tablet 90 each 3  . furosemide (LASIX) 40 MG tablet Take 40 mg by mouth daily as needed for fluid (if weight increases >= 2 lb).    . furosemide (LASIX) 80 MG tablet TAKE 1 TABLET BY MOUTH DAILY 90 tablet 1  . glucagon (GLUCAGON EMERGENCY) 1 MG injection Inject 1 mg into the vein once as needed (for severe reaction).    . hydrocortisone (ANUSOL-HC) 25 MG suppository Place 25 mg rectally 2 (two) times daily.    . isosorbide mononitrate (IMDUR) 60 MG 24 hr tablet TAKE 1 TABLET BY MOUTH ONCE DAILY 90 tablet 2  . Magnesium Oxide (MAG-OXIDE PO) Take 250 mg by mouth daily.    . metoprolol (LOPRESSOR) 50 MG tablet Take 1.5 tablets (75 mg total) by mouth 2 (two) times daily. 270 tablet 6  . Multiple Vitamins-Minerals (MULTIVITAMIN WITH MINERALS) tablet Take 1 tablet by mouth daily.      . nitroGLYCERIN (NITROLINGUAL) 0.4 MG/SPRAY spray Place 1 spray under the tongue every 5 (five) minutes as needed for chest pain. 12 g 1  . omeprazole (PRILOSEC) 20 MG capsule Take 20 mg by mouth daily.    . ONE TOUCH ULTRA TEST test strip 1 each by Other route as needed for other.     . potassium chloride SA (K-DUR,KLOR-CON) 20 MEQ tablet Take 40 mEq by mouth every morning.    . pravastatin (PRAVACHOL)  10 MG tablet TAKE 1 TABLET BY MOUTH ONCE DAILY 90 tablet 1  . ticagrelor (BRILINTA) 60 MG TABS tablet Take 60 mg by mouth 2 (two) times daily.    . valACYclovir (VALTREX) 1000 MG tablet Take 1,000 mg by mouth 3 (three) times daily. X 7 days for cold sores    . vitamin C (ASCORBIC ACID) 250 MG tablet Take 250 mg by mouth 2 (two) times daily.    . prednisoLONE acetate (PRED FORTE) 1 % ophthalmic suspension PLACE 1 DROP IN BOTH EYES BID  0   No current facility-administered medications for this visit.     Past Medical History  Diagnosis Date  . Aortic stenosis     a. bicuspid aortic valve; mean gradient of 20 mmHg in 2/10; b. s/p bioprosth AVR (19-mm Edwards pericardial valve)  with a redo coronary artery bypass graft procedure in 07/2009; postoperative Dressler's syndrome    . Coronary artery disease     a. initial CABG with SVG-RCA in 12/05. b. redo SVG-RCA and bioprosthetic AVR in 1/11. d. 9/14 - PCI with DES to distal LM/proximal LAD was done rather than bypass as she would have been a poor candidate for redo sternotomy. d. S/p DES to prox LM 05/2014.  Marland Kitchen Chronic diastolic CHF (congestive heart failure) ([place])     a. 9.2014 EchoP EF 60-65%, no rwma, bioprosth AVR, mean gradient of 49mHg, mild to mod MR, PASP 551mg.  . Marland Kitchenyperlipidemia   . Hypertension   . Small bowel obstruction (H[facility])     Recurrent; resolved after resection of a lipoma  . Osteopenia      hip on DEXA in October 2007.  . Marland Kitchennemia   . Carcinoma, renal cell ([facility]11/2008    Laparoscopic right nephrectomy  . NASH (nonalcoholic steatohepatitis) 1999    -biopsy in 1999  . Diabetes mellitus 03/2010     TYPE II: Hemoglobin A1c of 7.4 in  03/2010; 8.4 in 06/2010; treated with insulin pump  . Migraines   . Duodenal ulcer     Remote; H. pylori positive  . Colitis, ischemic ([facility] [place]2008  . Gastroesophageal reflux disease     ? of GASTROPARESIS; IRRITABLE BOWEL SYNDROME  . Hodgkin's disease ([facility]1991    Mantle radiation therapy  .  Statin intolerance   . Hypercalcemia     Ca-11.1 in 07/2011  . Vitamin D deficiency   . Costochondritis   . Insomnia   . Iron deficiency anemia     a. 03/2013 EGD: essentially normal. Possible slow GIB.  . Marland Kitchenastroparesis due to DM (HCHolding[place]10/04/2012  . Hyperplastic gastric polyp 06/23/2012  . Arthritis   . Osteoporosis   . Pacemaker 08/21/2012    Medtronic Adapta L dual-chamber pacemaker-Dr. TaLovena Le. Anxiety   . Syncope     a. H/o recurrent syncope with pauses on loop recorder s/p Medtronic pacemaker 07/2012.  . CKD (chronic kidney disease), stage II   . PAF (paroxysmal atrial fibrillation) ([facility]Not[facility]    a.  h/o brief episodes noted on ppm interrogation. b. given GI bleeding and need to be on both ASA 81 and Brilinta, risks likely outweigh benefits of anticoagulation.   . Marland Kitchenysrhythmia     sick sinus syndrome  . Heart murmur   . Hodgkin's disease ([facility]    hx  . PONV (postoperative nausea and vomiting)     1. last surg Valve  suction caused sore on inner gum rt side, 2 ." be careful with neck tilt have had whiplash few times, 3. Hypotension    ROS:   All systems reviewed and negative except as noted in the HPI.   Past Surgical History  Procedure Laterality Date  . Total abdominal hysterectomy w/ bilateral salpingoophorectomy  2000  . Breast excisional biopsy      benign, bilateral  . Nephrectomy  2008    Laparoscopic right nephrectomy, renal cell carcinoma  . Coronary artery bypass graft  2005, 2011    CABG-most recent SVG to RCA-12/05; RCA occluded with patent graft in 2006; Redo bypass in 07/2009  . Tumor excision  1981    removal of Hodgkins lymphoma  . Exploratory laparotomy w/ bowel resection      Small bowel resection of lipoma  . Aortic valve replacement  2011    Aortic valve replacement surgery with  a 19 mm bioprosthetic device post redo CABG January 2011.  . Colonoscopy      Multiple with adenomatous polyps  . Cervical biopsy    . Liver biopsy    . Bone marrow biopsy     . Esophagogastroduodenoscopy    . Esophagogastroduodenoscopy  06/23/2012    Procedure: ESOPHAGOGASTRODUODENOSCOPY (EGD);  Surgeon: Gatha Mayer, MD;  Location: [facility];  Service: Endoscopy;  Laterality: N/A;  . Colonoscopy  06/23/2012    Procedure: COLONOSCOPY;  Surgeon: Gatha Mayer, MD;  Location: [facility] ENDOSCOPY;  Service: Endoscopy;  Laterality: N/A;  [name] dilation  06/23/2012    Procedure: [name] DILATION;  Surgeon: Gatha Mayer, MD;  Location: [facility] ENDOSCOPY;  Service: Endoscopy;;  54 fr  . Pacemaker insertion  08/21/2012    Medtronic Adapta L dual-chamber pacemaker  . Esophagogastroduodenoscopy N/A 04/06/2013    Procedure: ESOPHAGOGASTRODUODENOSCOPY (EGD);  Surgeon: Irene Shipper, MD;  Location[facility] ENDOSCOPY;  Service: Endoscopy;  Laterality: N/A;  . Givens capsule study N/A 04/30/2013    Procedure: GIVENS CAPSULE STUDY;  Surgeon: Gatha Mayer, MD;  Location: [facility];  Service: Endoscopy;  Laterality: N/A;  . Loop recorder implant N/A 12/10/2011    Procedure: LOOP RECORDER IMPLANT;  Surgeon: Evans Lance, MD;  Location: [facility] CATH LAB;  Service: Cardiovascular;  Laterality: N/A;  . Permanent pacemaker insertion N/A 08/21/2012    Procedure: PERMANENT PACEMAKER INSERTION;  Surgeon: Evans Lance, MD;  Location: [facility] CATH LAB;  Service: Cardiovascular;  Laterality: N/A;  . Left and right heart catheterization with coronary angiogram N/A 04/07/2013    Procedure: LEFT AND RIGHT HEART CATHETERIZATION WITH CORONARY ANGIOGRAM;  Surgeon: Larey Dresser, MD;  Location: [facility] CATH LAB;  Service: Cardiovascular;  Laterality: N/A;  . Percutaneous coronary stent intervention (pci-s) N/A 04/09/2013    Procedure: PERCUTANEOUS CORONARY STENT INTERVENTION (PCI-S);  Surgeon: Burnell Blanks, MD;  Location: [facility] CATH LAB;  Service: Cardiovascular;  Laterality: N/A;  . Left heart catheterization with coronary/graft angiogram N/A 06/07/2014    Procedure: LEFT HEART CATHETERIZATION WITH  Beatrix Fetters;  Surgeon: Burnell Blanks, MD;  Location: [facility] CATH LAB;  Service: Cardiovascular;  Laterality: N/A;  . Mastectomy Left 05/17/2015    total   . Total mastectomy Left 05/17/2015    Procedure: LEFT TOTAL MASTECTOMY;  Surgeon: Rolm Bookbinder, MD;  Location[facility];  Service: General;  Laterality: Left;     Family History  Problem Relation Age of Onset  . Heart attack Mother   . Heart disease Mother   . Heart attack Father   . Diabetes Father   . Heart disease Father   . Hypertension Brother   . Glaucoma Brother   . Lung cancer Sister     meta  . Breast cancer Sister     half sister (Paternal)  . Ovarian cancer Sister   . Cervical cancer Sister     half sister (maternal)  . Breast cancer Sister   . Colon cancer Neg Hx   . Stomach cancer Neg Hx   . Lung cancer Sister     half sister (maternal)     Social History   Social History  . Marital Status: Married    Spouse Name: N/A  . Number of Children: 2  . Years of Education: N/A   Occupational History  . full time     elementary principal   Social History Main Topics  . Smoking status: Never Smoker   . Smokeless tobacco:  Never Used  . Alcohol Use: No  . Drug Use: No  . Sexual Activity: Yes   Other Topics Concern  . 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     BP 118/62 mmHg  Pulse 79  Ht 5' 3"  (1.6 m)  Wt 126 lb (57.153 kg)  BMI 22.33 kg/m2  SpO2 99%  Physical Exam:  Well appearing 65 year old woman, NAD HEENT: Unremarkable Neck:  6 cm JVD, no thyromegally Back:  No CVA tenderness Lungs:  Clear with no wheezes, rales, or rhonchi. HEART:  Regular rate rhythm, no murmurs, no rubs, no clicks Abd:  soft, positive bowel sounds, no organomegally, no rebound, no guarding Ext:  2 plus pulses, no edema, no cyanosis, no clubbing Skin:  No rashes no nodules Neuro:  CN II through XII intact, motor grossly  intact  DEVICE - Normal DDD PM function Normal device function.  See PaceArt for details.   Assess/Plan: 1. PAF - she is maintaining NSR 99.8% of the time. She is pending consultation from Dr. Greggory Brandy to discuss Watchman procedure. 2. CAD - she denies anginal symptoms at present. She will continue her ASA and Brilinta 3. PPM - her medtronic DDD PM is working normally. 4. Syncope - she has had none since her PPM insertion.  Mikle Bosworth.D.

## 2015-11-09 NOTE — Patient Instructions (Signed)
Your physician wants you to follow-up in: 1 Year with Dr. Lovena Le. You will receive a reminder letter in the mail two months in advance. If you don't receive a letter, please call our office to schedule the follow-up appointment.  Remote monitoring is used to monitor your Pacemaker of ICD from home. This monitoring reduces the number of office visits required to check your device to one time per year. It allows [name] to keep an eye on the functioning of your device to ensure it is working properly. You are scheduled for a device check from home on 02/07/16. You may send your transmission at any time that day. If you have a wireless device, the transmission will be sent automatically. After your physician reviews your transmission, you will receive a postcard with your next transmission date.   Your physician recommends that you continue on your current medications as directed. Please refer to the Current Medication list given to you today.  Thank you for choosing [facility]!

## 2015-11-11 ENCOUNTER — Encounter: Payer: Self-pay | Admitting: Cardiology

## 2015-11-12 ENCOUNTER — Encounter (HOSPITAL_COMMUNITY): Payer: Self-pay | Admitting: Emergency Medicine

## 2015-11-12 ENCOUNTER — Emergency Department (HOSPITAL_COMMUNITY)
Admission: EM | Admit: 2015-11-12 | Discharge: 2015-11-13 | Disposition: A | Discharge status: Home / Self Care | Payer: Medicare Other | Attending: Emergency Medicine | Admitting: Emergency Medicine

## 2015-11-12 DIAGNOSIS — E785 Hyperlipidemia, unspecified: Secondary | ICD-10-CM | POA: Insufficient documentation

## 2015-11-12 DIAGNOSIS — R011 Cardiac murmur, unspecified: Secondary | ICD-10-CM | POA: Insufficient documentation

## 2015-11-12 DIAGNOSIS — I129 Hypertensive chronic kidney disease with stage 1 through stage 4 chronic kidney disease, or unspecified chronic kidney disease: Secondary | ICD-10-CM | POA: Insufficient documentation

## 2015-11-12 DIAGNOSIS — E559 Vitamin D deficiency, unspecified: Secondary | ICD-10-CM | POA: Insufficient documentation

## 2015-11-12 DIAGNOSIS — G43909 Migraine, unspecified, not intractable, without status migrainosus: Secondary | ICD-10-CM | POA: Diagnosis not present

## 2015-11-12 DIAGNOSIS — Z880 Allergy status to penicillin: Secondary | ICD-10-CM | POA: Insufficient documentation

## 2015-11-12 DIAGNOSIS — Z91040 Latex allergy status: Secondary | ICD-10-CM | POA: Insufficient documentation

## 2015-11-12 DIAGNOSIS — Z79899 Other long term (current) drug therapy: Secondary | ICD-10-CM | POA: Insufficient documentation

## 2015-11-12 DIAGNOSIS — Z7952 Long term (current) use of systemic steroids: Secondary | ICD-10-CM | POA: Diagnosis not present

## 2015-11-12 DIAGNOSIS — Z7982 Long term (current) use of aspirin: Secondary | ICD-10-CM | POA: Insufficient documentation

## 2015-11-12 DIAGNOSIS — A0472 Enterocolitis due to Clostridium difficile, not specified as recurrent: Secondary | ICD-10-CM

## 2015-11-12 DIAGNOSIS — R197 Diarrhea, unspecified: Secondary | ICD-10-CM | POA: Diagnosis present

## 2015-11-12 DIAGNOSIS — I5032 Chronic diastolic (congestive) heart failure: Secondary | ICD-10-CM | POA: Insufficient documentation

## 2015-11-12 DIAGNOSIS — Z85528 Personal history of other malignant neoplasm of kidney: Secondary | ICD-10-CM | POA: Diagnosis not present

## 2015-11-12 DIAGNOSIS — A047 Enterocolitis due to Clostridium difficile: Secondary | ICD-10-CM | POA: Insufficient documentation

## 2015-11-12 DIAGNOSIS — E119 Type 2 diabetes mellitus without complications: Secondary | ICD-10-CM | POA: Insufficient documentation

## 2015-11-12 DIAGNOSIS — K219 Gastro-esophageal reflux disease without esophagitis: Secondary | ICD-10-CM | POA: Diagnosis not present

## 2015-11-12 DIAGNOSIS — I2510 Atherosclerotic heart disease of native coronary artery without angina pectoris: Secondary | ICD-10-CM | POA: Diagnosis not present

## 2015-11-12 DIAGNOSIS — N182 Chronic kidney disease, stage 2 (mild): Secondary | ICD-10-CM | POA: Diagnosis not present

## 2015-11-12 DIAGNOSIS — F419 Anxiety disorder, unspecified: Secondary | ICD-10-CM | POA: Insufficient documentation

## 2015-11-12 DIAGNOSIS — A045 Campylobacter enteritis: Secondary | ICD-10-CM | POA: Diagnosis not present

## 2015-11-12 DIAGNOSIS — Z8619 Personal history of other infectious and parasitic diseases: Secondary | ICD-10-CM | POA: Diagnosis not present

## 2015-11-12 DIAGNOSIS — Z8571 Personal history of Hodgkin lymphoma: Secondary | ICD-10-CM | POA: Insufficient documentation

## 2015-11-12 LAB — CBC
HCT: 37.3 % (ref 36.0–46.0)
Hemoglobin: 12.3 g/dL (ref 12.0–15.0)
MCH: 30.7 pg (ref 26.0–34.0)
MCHC: 33 g/dL (ref 30.0–36.0)
MCV: 93 fL (ref 78.0–100.0)
PLATELETS: 173 10*3/uL (ref 150–400)
RBC: 4.01 MIL/uL (ref 3.87–5.11)
RDW: 13.7 % (ref 11.5–15.5)
WBC: 15 10*3/uL — ABNORMAL HIGH (ref 4.0–10.5)

## 2015-11-12 LAB — LIPASE, BLOOD: Lipase: 38 U/L (ref 11–51)

## 2015-11-12 LAB — COMPREHENSIVE METABOLIC PANEL
ALK PHOS: 135 U/L — AB (ref 38–126)
ALT: 50 U/L (ref 14–54)
AST: 33 U/L (ref 15–41)
Albumin: 4.1 g/dL (ref 3.5–5.0)
Anion gap: 13 (ref 5–15)
BILIRUBIN TOTAL: 0.6 mg/dL (ref 0.3–1.2)
BUN: 28 mg/dL — ABNORMAL HIGH (ref 6–20)
CO2: 29 mmol/L (ref 22–32)
Calcium: 10.2 mg/dL (ref 8.9–10.3)
Chloride: 101 mmol/L (ref 101–111)
Creatinine, Ser: 1.21 mg/dL — ABNORMAL HIGH (ref 0.44–1.00)
GFR, EST AFRICAN AMERICAN: 54 mL/min — AB (ref >60)
GFR, EST NON AFRICAN AMERICAN: 47 mL/min — AB (ref >60)
GLUCOSE: 94 mg/dL (ref 65–99)
Potassium: 3.9 mmol/L (ref 3.5–5.1)
Sodium: 143 mmol/L (ref 135–145)
Total Protein: 8.1 g/dL (ref 6.5–8.1)

## 2015-11-12 LAB — URINALYSIS, ROUTINE W REFLEX MICROSCOPIC
BILIRUBIN URINE: NEGATIVE
GLUCOSE, UA: NEGATIVE mg/dL
HGB URINE DIPSTICK: NEGATIVE
Ketones, ur: NEGATIVE mg/dL
Leukocytes, UA: NEGATIVE
Nitrite: NEGATIVE
PROTEIN: NEGATIVE mg/dL
Specific Gravity, Urine: 1.007 (ref 1.005–1.030)
pH: 7 (ref 5.0–8.0)

## 2015-11-12 LAB — CBG MONITORING, ED: Glucose-Capillary: 122 mg/dL — ABNORMAL HIGH (ref 65–99)

## 2015-11-12 LAB — I-STAT CG4 LACTIC ACID, ED: Lactic Acid, Venous: 1.32 mmol/L (ref 0.5–2.0)

## 2015-11-12 MED ORDER — SODIUM CHLORIDE 0.9 % IV BOLUS (SEPSIS)
500.0000 mL | Freq: Once | INTRAVENOUS | Status: AC
  Administered 2015-11-12: 500 mL via INTRAVENOUS

## 2015-11-12 NOTE — ED Notes (Signed)
Pt. reports diarrhea for 2 weeks diagnosed with C- diff today , denies diarrhea or vomitting today . No fever or chills.

## 2015-11-13 MED ORDER — CIPROFLOXACIN HCL 500 MG PO TABS
750.0000 mg | ORAL_TABLET | Freq: Once | ORAL | Status: AC
  Administered 2015-11-13: 750 mg via ORAL
  Filled 2015-11-13: qty 2

## 2015-11-13 MED ORDER — VANCOMYCIN 50 MG/ML ORAL SOLUTION
125.0000 mg | Freq: Once | ORAL | Status: AC
  Administered 2015-11-13: 125 mg via ORAL
  Filled 2015-11-13: qty 2.5

## 2015-11-13 MED ORDER — ONDANSETRON HCL 4 MG PO TABS: 4.0000 mg | ORAL_TABLET | Freq: Four times a day (QID) | ORAL | Status: DC

## 2015-11-13 MED ORDER — VANCOMYCIN HCL 125 MG PO CAPS: 125.0000 mg | ORAL_CAPSULE | Freq: Four times a day (QID) | ORAL | Status: AC

## 2015-11-13 MED ORDER — CIPROFLOXACIN HCL 750 MG PO TABS: 750.0000 mg | ORAL_TABLET | Freq: Two times a day (BID) | ORAL | Status: AC

## 2015-11-13 NOTE — ED Notes (Signed)
Pt left at this time with all belongings.

## 2015-11-13 NOTE — Discharge Instructions (Signed)
You were seen and evaluated today for your diarrhea and to her elevated kidney function on your outpatient labs. Her kidney function has improved. Continue to keep herself well-hydrated. Do not use Lomotil. Take the antibiotics prescribed. Follow-up with your primary care physician for recheck of your labs this week. Follow-up with your gastroenterologist as needed. Contact her gastroenterologist if you're unable to tolerate the medications prescribed.  Clostridium Difficile Infection Clostridium difficile (C. difficile or C. diff) is a germ found in the intestines. C. difficile infection can happen after taking antibiotic medicines. Antiobiotics may cause the C. difficile to grow out of control and make a toxin that causes the infection. C. difficile infection can cause watery poop (diarrhea) or severe disease. HOME CARE  Drink enough fluids to keep your pee (urine) clear or pale yellow. Avoid milk, caffeine, and alcohol.  Ask your doctor how to replace the body fluids you lost (rehydrate).  Eat small meals often. Avoid eating large meals.  Take your antibiotics as told. Finish them even if you start to feel better.  Do not  take medicines that help watery poop. These medicines may stop you from healing as fast or cause problems.  Wash your hands after using the bathroom and before touching food. Make sure people who live with you wash their hands often.  Clean all surfaces in your home. Use a product that has chlorine bleach in it. GET HELP IF:  Your watery poop lasts longer than expected.  Your watery poop comes back after you finish your antibiotic medicine.  You feel very dry or thirsty (dehydrated).  You have a fever. GET HELP RIGHT AWAY IF:   You have more stomach pain or tenderness.  You have blood in your poop (stool). Your poop may look black and tar-like.  You cannot eat or drink without throwing up (vomiting).   This information is not intended to replace advice given  to you by your health care provider. Make sure you discuss any questions you have with your health care provider.   Document Released: 05/06/2009 Document Revised: 07/30/2014 Document Reviewed: 01/10/2015 Elsevier Interactive Patient Education [employer].

## 2015-11-13 NOTE — ED Provider Notes (Signed)
CSN: [id number]     Arrival date & time 11/12/15  1905 History   First MD Initiated Contact with Patient 11/12/15 2136     Chief Complaint  Patient presents with  . Diarrhea     (Consider location/radiation/quality/duration/timing/severity/associated sxs/prior Treatment) HPI Comments: 65 year old female with history including gastroparesis, IBS presents for diarrhea. The patient reports that since April 4 she has had diarrhea that has seemed to get slightly better at times but they get worse. She was seen by her gastroenterologist a few days ago and had outpatient labs that resulted today and showed that she had an elevated creatinine above 2 that her stool was positive for C. difficile and Campylobacter. The patient only has a solitary kidney secondary to a nephrectomy for kidney cancer in the past. The patient was called today when her labs resulted and was instructed to present to an emergency department for evaluation. The patient reports that she's actually been feeling better today and went to a cortical ornament this morning at her church. She did take a dose of Lomotil today but knows that she should not take it any longer secondary to her positive C. difficile. Eyes fevers, chills, active abdominal pain. She reports that over the last few weeks she has been having cramping abdominal pain.  Patient is a 65 y.o. female presenting with diarrhea.  Diarrhea Associated symptoms: abdominal pain (cramping)   Associated symptoms: no chills, no fever, no myalgias and no vomiting     Past Medical History  Diagnosis Date  . Aortic stenosis     a. bicuspid aortic valve; mean gradient of 20 mmHg in 2/10; b. s/p bioprosth AVR (19-mm Edwards pericardial valve) with a redo coronary artery bypass graft procedure in 07/2009; postoperative Dressler's syndrome    . Coronary artery disease     a. initial CABG with SVG-RCA in 12/05. b. redo SVG-RCA and bioprosthetic AVR in 1/11. d. 9/14 - PCI with DES to  distal LM/proximal LAD was done rather than bypass as she would have been a poor candidate for redo sternotomy. d. S/p DES to prox LM 05/2014.  Marland Kitchen Chronic diastolic CHF (congestive heart failure) ([place])     a. 9.2014 EchoP EF 60-65%, no rwma, bioprosth AVR, mean gradient of 76mHg, mild to mod MR, PASP 527mg.  . Marland Kitchenyperlipidemia   . Hypertension   . Small bowel obstruction (H[facility])     Recurrent; resolved after resection of a lipoma  . Osteopenia      hip on DEXA in October 2007.  . Marland Kitchennemia   . Carcinoma, renal cell ([facility]11/2008    Laparoscopic right nephrectomy  . NASH (nonalcoholic steatohepatitis) 1999    -biopsy in 1999  . Diabetes mellitus 03/2010     TYPE II: Hemoglobin A1c of 7.4 in  03/2010; 8.4 in 06/2010; treated with insulin pump  . Migraines   . Duodenal ulcer     Remote; H. pylori positive  . Colitis, ischemic ([facility]2008  . Gastroesophageal reflux disease     ? of GASTROPARESIS; IRRITABLE BOWEL SYNDROME  . Hodgkin's disease ([facility]1991    [place] radiation therapy  . Statin intolerance   . Hypercalcemia     Ca-11.1 in 07/2011  . Vitamin D deficiency   . Costochondritis   . Insomnia   . Iron deficiency anemia     a. 03/2013 EGD: essentially normal. Possible slow GIB.  . Marland Kitchenastroparesis due to DM ([facility]10/04/2012  . Hyperplastic gastric polyp 06/23/2012  . Arthritis   .  Osteoporosis   . Pacemaker 08/21/2012    Medtronic Adapta L dual-chamber pacemaker-Dr. Lovena Le  . Anxiety   . Syncope     a. H/o recurrent syncope with pauses on loop recorder s/p Medtronic pacemaker 07/2012.  . CKD (chronic kidney disease), stage II   . PAF (paroxysmal atrial fibrillation) ([place])     a.  h/o brief episodes noted on ppm interrogation. b. given GI bleeding and need to be on both ASA 81 and Brilinta, risks likely outweigh benefits of anticoagulation.   Marland Kitchen Dysrhythmia     sick sinus syndrome  . Heart murmur   . Hodgkin's disease ([place])     hx  . PONV (postoperative nausea and vomiting)     1.  last surg Valve  suction caused sore on inner gum rt side, 2 ." be careful with neck tilt have had whiplash few times, 3. Hypotension   Past Surgical History  Procedure Laterality Date  . Total abdominal hysterectomy w/ bilateral salpingoophorectomy  2000  . Breast excisional biopsy      benign, bilateral  . Nephrectomy  2008    Laparoscopic right nephrectomy, renal cell carcinoma  . Coronary artery bypass graft  2005, 2011    CABG-most recent SVG to RCA-12/05; RCA occluded with patent graft in 2006; Redo bypass in 07/2009  . Tumor excision  1981    removal of Hodgkins lymphoma  . Exploratory laparotomy w/ bowel resection      Small bowel resection of lipoma  . Aortic valve replacement  2011    Aortic valve replacement surgery with a 19 mm bioprosthetic device post redo CABG January 2011.  . Colonoscopy      Multiple with adenomatous polyps  . Cervical biopsy    . Liver biopsy    . Bone marrow biopsy    . Esophagogastroduodenoscopy    . Esophagogastroduodenoscopy  06/23/2012    Procedure: ESOPHAGOGASTRODUODENOSCOPY (EGD);  Surgeon: Gatha Mayer, MD;  Location: [facility];  Service: Endoscopy;  Laterality: N/A;  . Colonoscopy  06/23/2012    Procedure: COLONOSCOPY;  Surgeon: Gatha Mayer, MD;  Location: [facility] ENDOSCOPY;  Service: Endoscopy;  Laterality: N/A;  [name] dilation  06/23/2012    Procedure: [name] DILATION;  Surgeon: Gatha Mayer, MD;  Location: [facility] ENDOSCOPY;  Service: Endoscopy;;  54 fr  . Pacemaker insertion  08/21/2012    Medtronic Adapta L dual-chamber pacemaker  . Esophagogastroduodenoscopy N/A 04/06/2013    Procedure: ESOPHAGOGASTRODUODENOSCOPY (EGD);  Surgeon: Irene Shipper, MD;  Location: [facility] ENDOSCOPY;  Service: Endoscopy;  Laterality: N/A;  . Givens capsule study N/A 04/30/2013    Procedure: GIVENS CAPSULE STUDY;  Surgeon: Gatha Mayer, MD;  Location: [place];  Service: Endoscopy;  Laterality: N/A;  . Loop recorder implant N/A 12/10/2011    Procedure: LOOP  RECORDER IMPLANT;  Surgeon: Evans Lance, MD;  Location: [facility] CATH LAB;  Service: Cardiovascular;  Laterality: N/A;  . Permanent pacemaker insertion N/A 08/21/2012    Procedure: PERMANENT PACEMAKER INSERTION;  Surgeon: Evans Lance, MD;  Location: [facility] CATH LAB;  Service: Cardiovascular;  Laterality: N/A;  . Left and right heart catheterization with coronary angiogram N/A 04/07/2013    Procedure: LEFT AND RIGHT HEART CATHETERIZATION WITH CORONARY ANGIOGRAM;  Surgeon: Larey Dresser, MD;  Location: [facility] CATH LAB;  Service: Cardiovascular;  Laterality: N/A;  . Percutaneous coronary stent intervention (pci-s) N/A 04/09/2013    Procedure: PERCUTANEOUS CORONARY STENT INTERVENTION (PCI-S);  Surgeon: Burnell Blanks, MD;  Location: [facility]  CATH LAB;  Service: Cardiovascular;  Laterality: N/A;  . Left heart catheterization with coronary/graft angiogram N/A 06/07/2014    Procedure: LEFT HEART CATHETERIZATION WITH Beatrix Fetters;  Surgeon: Burnell Blanks, MD;  Location: [facility] CATH LAB;  Service: Cardiovascular;  Laterality: N/A;  . Mastectomy Left 05/17/2015    total   . Total mastectomy Left 05/17/2015    Procedure: LEFT TOTAL MASTECTOMY;  Surgeon: Rolm Bookbinder, MD;  Location: [facility] OR;  Service: General;  Laterality: Left;   Family History  Problem Relation Age of Onset  . Heart attack Mother   . Heart disease Mother   . Heart attack Father   . Diabetes Father   . Heart disease Father   . Hypertension Brother   . Glaucoma Brother   . Lung cancer Sister     meta  . Breast cancer Sister     half sister (Paternal)  . Ovarian cancer Sister   . Cervical cancer Sister     half sister (maternal)  . Breast cancer Sister   . Colon cancer Neg Hx   . Stomach cancer Neg Hx   . Lung cancer Sister     half sister (maternal)   Social History  Substance Use Topics  . Smoking status: Never Smoker   . Smokeless tobacco: Never Used  . Alcohol Use: No   OB History    No data available      Review of Systems  Constitutional: Negative for fever, chills and fatigue.  HENT: Negative for congestion, postnasal drip and rhinorrhea.   Eyes: Negative for pain and visual disturbance.  Respiratory: Negative for cough, chest tightness and shortness of breath.   Cardiovascular: Negative for chest pain and palpitations.  Gastrointestinal: Positive for abdominal pain (cramping) and diarrhea. Negative for nausea, vomiting and constipation.  Genitourinary: Negative for dysuria, urgency, hematuria and flank pain.  Musculoskeletal: Negative for myalgias and back pain.  Skin: Negative for rash.  Neurological: Negative for dizziness, weakness and light-headedness.  Hematological: Does not bruise/bleed easily.      Allergies  Cephalexin; Clindamycin hcl; Clindamycin/lincomycin; Humalog; Novolog; Penicillins; Tizanidine; Crestor; Gentamycin; Limonene; Atorvastatin; Avandia; Cholestyramine; Codeine; Metformin; Nickel; Plavix; Pravastatin; Ranitidine; Simvastatin; Tamiflu; Tramadol; Erythromycin; Iodinated diagnostic agents; Latex; Neosporin; and Neomycin-bacitracin-polymyxin   Home Medications   Prior to Admission medications   Medication Sig Start Date End Date Taking? Authorizing Provider  acyclovir cream (ZOVIRAX) 5 % 1 application as needed for cold sores 12/24/14  Yes Historical Provider, MD  Alirocumab 75 MG/ML SOPN Inject 75 mg into the skin every 14 (fourteen) days. 10/24/15  Yes Larey Dresser, MD  ALPRAZolam Duanne Moron) 0.5 MG tablet Take 0.5 mg by mouth at bedtime. May take additional dose daily for anxiety   Yes Historical Provider, MD  APIDRA 100 UNIT/ML injection INFUSE THROUGH INSULIN PUMP UTD. Has been receiving 82-125 units daily over last 10 days.  Asked pt to bring with her on admission 10/25 02/04/15  Yes Historical Provider, MD  aspirin EC 81 MG tablet Take 81 mg by mouth at bedtime.   Yes Historical Provider, MD  Calcium Carbonate-Vitamin D (RA CALCIUM PLUS VITAMIN D) 600-400  MG-UNIT per tablet Take 1 tablet by mouth 2 (two) times daily.    Yes Historical Provider, MD  Cholecalciferol 2000 UNITS TABS Take 1 tablet by mouth daily.   Yes Historical Provider, MD  clobetasol (TEMOVATE) 0.05 % external solution Apply 1 application topically daily as needed (for hives).    Yes Historical Provider, MD  desoximetasone ([place])  0.05 % cream Apply 1 application topically 2 (two) times daily as needed. When skin starts peeling   Yes Historical Provider, MD  diclofenac sodium (VOLTAREN) 1 % GEL Apply topically as needed (for body aches).    Yes Historical Provider, MD  docusate sodium (COLACE) 100 MG capsule Take 100 mg by mouth 2 (two) times daily.   Yes Historical Provider, MD  Fe Fum-FA-B Cmp-C-Zn-Mg-Mn-Cu (HEMATINIC PLUS COMPLEX) 106-1 MG TABS Take 1 tablet daily with with a Vitamin C tablet 07/21/15  Yes Lennis P Livesay, MD  furosemide (LASIX) 80 MG tablet TAKE 1 TABLET BY MOUTH DAILY 10/14/15  Yes Larey Dresser, MD  glucagon (GLUCAGON EMERGENCY) 1 MG injection Inject 1 mg into the vein once as needed (for severe reaction).   Yes Historical Provider, MD  hydrocortisone (ANUSOL-HC) 25 MG suppository Place 25 mg rectally 2 (two) times daily.   Yes Historical Provider, MD  isosorbide mononitrate (IMDUR) 60 MG 24 hr tablet TAKE 1 TABLET BY MOUTH ONCE DAILY 10/18/15  Yes Larey Dresser, MD  Magnesium Oxide (MAG-OXIDE PO) Take 250 mg by mouth daily.   Yes Historical Provider, MD  metoprolol (LOPRESSOR) 50 MG tablet Take 1.5 tablets (75 mg total) by mouth 2 (two) times daily. 12/30/14  Yes Evans Lance, MD  Multiple Vitamins-Minerals (MULTIVITAMIN WITH MINERALS) tablet Take 1 tablet by mouth daily.     Yes Historical Provider, MD  nitroGLYCERIN (NITROLINGUAL) 0.4 MG/SPRAY spray Place 1 spray under the tongue every 5 (five) minutes as needed for chest pain. 03/10/15  Yes Evans Lance, MD  omeprazole (PRILOSEC) 20 MG capsule Take 20 mg by mouth daily.   Yes Historical Provider, MD   potassium chloride SA (K-DUR,KLOR-CON) 20 MEQ tablet Take 40 mEq by mouth every morning.   Yes Historical Provider, MD  pravastatin (PRAVACHOL) 10 MG tablet TAKE 1 TABLET BY MOUTH ONCE DAILY 10/14/15  Yes Larey Dresser, MD  prednisoLONE acetate (PRED FORTE) 1 % ophthalmic suspension PLACE 1 DROP IN BOTH EYES BID 10/31/15  Yes Historical Provider, MD  ticagrelor (BRILINTA) 60 MG TABS tablet Take 60 mg by mouth 2 (two) times daily.   Yes Historical Provider, MD  valACYclovir (VALTREX) 1000 MG tablet Take 1,000 mg by mouth 3 (three) times daily. X 7 days for cold sores 12/24/14  Yes Historical Provider, MD  vitamin C (ASCORBIC ACID) 250 MG tablet Take 250 mg by mouth 2 (two) times daily.   Yes Historical Provider, MD  azithromycin (ZITHROMAX) 500 MG tablet 1 tablet by mouth one hour prior to dental appointment/procedure Patient not taking: Reported on 11/12/2015 12/21/13   Larey Dresser, MD  ciprofloxacin (CIPRO) 750 MG tablet Take 1 tablet (750 mg total) by mouth 2 (two) times daily. 11/13/15 11/20/15  Harvel Quale, MD  ondansetron (ZOFRAN) 4 MG tablet Take 1 tablet (4 mg total) by mouth every 6 (six) hours. 11/13/15   Harvel Quale, MD  ONE TOUCH ULTRA TEST test strip 1 each by Other route as needed for other.  03/15/14   Historical Provider, MD  vancomycin (VANCOCIN HCL) 125 MG capsule Take 1 capsule (125 mg total) by mouth 4 (four) times daily. 11/13/15 11/27/15  Harvel Quale, MD   BP 164/75 mmHg  Pulse 68  Temp(Src) 97.7 F (36.5 C) (Oral)  Resp 18  Wt 126 lb 3 oz (57.238 kg)  SpO2 99% Physical Exam  Constitutional: She is oriented to person, place, and time. She appears well-developed and well-nourished. No  distress.  HENT:  Head: Normocephalic and atraumatic.  Right Ear: External ear normal.  Left Ear: External ear normal.  Nose: Nose normal.  Mouth/Throat: Oropharynx is clear and moist. No oropharyngeal exudate.  Eyes: EOM are normal. Pupils are equal, round, and reactive to  light.  Neck: Normal range of motion. Neck supple.  Cardiovascular: Normal rate, regular rhythm, normal heart sounds and intact distal pulses.   No murmur heard. Pulmonary/Chest: Effort normal. No respiratory distress. She has no wheezes. She has no rales.  Abdominal: Soft. She exhibits no distension. There is no tenderness.  Musculoskeletal: Normal range of motion. She exhibits no edema or tenderness.  Neurological: She is alert and oriented to person, place, and time.  Skin: Skin is warm and dry. No rash noted. She is not diaphoretic.  Vitals reviewed.   ED Course  Procedures (including critical care time) Labs Review Labs Reviewed  COMPREHENSIVE METABOLIC PANEL - Abnormal; Notable for the following:    BUN 28 (*)    Creatinine, Ser 1.21 (*)    Alkaline Phosphatase 135 (*)    GFR calc non Af Amer 47 (*)    GFR calc Af Amer 54 (*)    All other components within normal limits  CBC - Abnormal; Notable for the following:    WBC 15.0 (*)    All other components within normal limits  CBG MONITORING, ED - Abnormal; Notable for the following:    Glucose-Capillary 122 (*)    All other components within normal limits  LIPASE, BLOOD  URINALYSIS, ROUTINE W REFLEX MICROSCOPIC (NOT AT [facility])  I-STAT CG4 LACTIC ACID, ED    Imaging Review No results found. I have personally reviewed and evaluated these images and lab results as part of my medical decision-making.   EKG Interpretation None      MDM  Patient was seen and evaluated in stable condition. She had her outpatient labs with her. They were reviewed. Patient was well-appearing, tolerating by mouth with a benign abdominal examination. Case was discussed with the gastroenterologist on-call at [place] who knew of the patient's case although the patient had spoken with the fellow earlier. He recommended that the patient be discharged on vancomycin 125 mg orally 4 times daily for 14 days as well as Cipro 750 mg twice daily for 7  days. He agreed with the plan for discharge since patient's labs had improved greatly and her creatinine was no 1.2. Patient was instructed to follow-up with her primary care physician and GI outpatient. She was given IV fluids here. She was also given her first doses of antibiotics. Final diagnoses:  C. difficile colitis  Campylobacter diarrhea    1. C. difficile 2. Campylobacter diarrhea    Harvel Quale, MD 11/13/15 [phone]

## 2015-11-18 LAB — CUP PACEART INCLINIC DEVICE CHECK
Battery Remaining Longevity: 156 mo
Brady Statistic AP VP Percent: 0.1 % — CL
Brady Statistic AS VS Percent: 99.6 %
Implantable Lead Implant Date: 20140130
Implantable Lead Location: 753859
Lead Channel Impedance Value: 638 Ohm
Lead Channel Sensing Intrinsic Amplitude: 2.8 mV
Lead Channel Sensing Intrinsic Amplitude: 22.4 mV
Lead Channel Setting Pacing Amplitude: 1.5 V
Lead Channel Setting Pacing Pulse Width: 0.4 ms
Lead Channel Setting Sensing Sensitivity: 5.6 mV
MDC IDC LEAD IMPLANT DT: 20140130
MDC IDC LEAD LOCATION: 753860
MDC IDC MSMT BATTERY IMPEDANCE: 161 Ohm
MDC IDC MSMT BATTERY VOLTAGE: 2.8 V
MDC IDC MSMT LEADCHNL RA IMPEDANCE VALUE: 441 Ohm
MDC IDC MSMT LEADCHNL RA PACING THRESHOLD AMPLITUDE: 0.75 V
MDC IDC MSMT LEADCHNL RA PACING THRESHOLD PULSEWIDTH: 0.4 ms
MDC IDC MSMT LEADCHNL RV PACING THRESHOLD AMPLITUDE: 0.75 V
MDC IDC MSMT LEADCHNL RV PACING THRESHOLD PULSEWIDTH: 0.4 ms
MDC IDC SESS DTM: 20170428102541
MDC IDC SET LEADCHNL RV PACING AMPLITUDE: 2.5 V
MDC IDC STAT BRADY AP VS PERCENT: 0.1 % — AB
MDC IDC STAT BRADY AS VP PERCENT: 0.3 %

## 2015-11-21 ENCOUNTER — Encounter: Payer: Self-pay | Admitting: Internal Medicine

## 2015-11-21 ENCOUNTER — Ambulatory Visit (INDEPENDENT_AMBULATORY_CARE_PROVIDER_SITE_OTHER): Payer: Medicare Other | Admitting: Internal Medicine

## 2015-11-21 VITALS — BP 144/70 | HR 72 | Ht 63.0 in | Wt 124.6 lb

## 2015-11-21 DIAGNOSIS — I480 Paroxysmal atrial fibrillation: Secondary | ICD-10-CM

## 2015-11-21 NOTE — Progress Notes (Signed)
Watchman Consult Note   Date:  11/21/2015   ID:  Azrael, Huss [birth year], MRN [id number]  PCP:  Purvis Kilts, MD  Cardiologist:  Aundra Dubin Primary Electrophysiologist: Lovena Le Referring Physician: Aundra Dubin   CC: to discuss Watchman implant    History of Present Illness: [name] [name] is a 65 y.o. female who presents today for evaluation of left atrial appendage occluder.  She has paroxysmal atrial fibrillation as well as GI bleeding requiring transfusions in the past.  The patient has been evaluated by their referring physician and is felt to be a poor candidate for long term [place] due to GI bleeding and need for long term DAPT.  She therefore presents today for Watchman evaluation.    Today, she denies symptoms of palpitations, chest pain, shortness of breath, orthopnea, PND, lower extremity edema, claudication, dizziness, presyncope, syncope, bleeding, or neurologic sequela. The patient is tolerating medications without difficulties and is otherwise without complaint today.    Past Medical History  Diagnosis Date  . Aortic stenosis     a. bicuspid aortic valve; mean gradient of 20 mmHg in 2/10; b. s/p bioprosth AVR (19-mm Edwards pericardial valve) with a redo coronary artery bypass graft procedure in 07/2009; postoperative Dressler's syndrome    . Coronary artery disease     a. initial CABG with SVG-RCA in 12/05. b. redo SVG-RCA and bioprosthetic AVR in 1/11. d. 9/14 - PCI with DES to distal LM/proximal LAD was done rather than bypass as she would have been a poor candidate for redo sternotomy. d. S/p DES to prox LM 05/2014.  Marland Kitchen Chronic diastolic CHF (congestive heart failure) ([place])     a. 9.2014 EchoP EF 60-65%, no rwma, bioprosth AVR, mean gradient of 31mHg, mild to mod MR, PASP 552mg.  . Marland Kitchenyperlipidemia   . Hypertension   . Small bowel obstruction (HCC)     Recurrent; resolved after resection of a lipoma  . Osteopenia      hip on DEXA in October 2007.  . Marland KitchenCarcinoma, renal cell (HCRial[place]11/2008    Laparoscopic right nephrectomy  . NASH (nonalcoholic steatohepatitis) 1999    -biopsy in 1999  . Diabetes mellitus 03/2010     TYPE II: Hemoglobin A1c of 7.4 in  03/2010; 8.4 in 06/2010; treated with insulin pump  . Migraines   . Duodenal ulcer     Remote; H. pylori positive  . Colitis, ischemic (HCM[place]keyt[place]2008  . Hodgkin's disease ([facility]1991    Mantle radiation therapy  . Iron deficiency anemia     a. 03/2013 EGD: essentially normal. Possible slow GIB.  . Marland Kitchenastroparesis due to DM (HCGr[place]10/04/2012  . Hyperplastic gastric polyp 06/23/2012  . Arthritis   . Osteoporosis   . Anxiety   . Syncope     a. H/o recurrent syncope with pauses on loop recorder s/p Medtronic pacemaker 07/2012.  . CKD (chronic kidney disease), stage II   . PAF (paroxysmal atrial fibrillation) ([facility]    a.  h/o brief episodes noted on ppm interrogation. b. given GI bleeding and need to be on both ASA 81 and Brilinta, risks likely outweigh benefits of anticoagulation.    Past Surgical History  Procedure Laterality Date  . Total abdominal hysterectomy w/ bilateral salpingoophorectomy  2000  . Breast excisional biopsy      benign, bilateral  . Nephrectomy  2008    Laparoscopic right nephrectomy, renal cell carcinoma  . Coronary artery bypass graft  2005, 2011    CABG-most  recent SVG to RCA-12/05; RCA occluded with patent graft in 2006; Redo bypass in 07/2009  . Tumor excision  1981    removal of Hodgkins lymphoma  . Exploratory laparotomy w/ bowel resection      Small bowel resection of lipoma  . Aortic valve replacement  2011    Aortic valve replacement surgery with a 19 mm bioprosthetic device post redo CABG January 2011.  . Colonoscopy      Multiple with adenomatous polyps  . Cervical biopsy    . Liver biopsy    . Bone marrow biopsy    . Esophagogastroduodenoscopy    . Esophagogastroduodenoscopy  06/23/2012    Procedure: ESOPHAGOGASTRODUODENOSCOPY (EGD);  Surgeon: Gatha Mayer, MD;  Location: [facility];  Service: Endoscopy;  Laterality: N/A;  . Colonoscopy  06/23/2012    Procedure: COLONOSCOPY;  Surgeon: Gatha Mayer, MD;  Location: [facility] ENDOSCOPY;  Service: Endoscopy;  Laterality: N/A;  [name] dilation  06/23/2012    Procedure: [name] DILATION;  Surgeon: Gatha Mayer, MD;  Location: [facility] ENDOSCOPY;  Service: Endoscopy;;  54 fr  . Pacemaker insertion  08/21/2012    Medtronic Adapta L dual-chamber pacemaker  . Esophagogastroduodenoscopy N/A 04/06/2013    Procedure: ESOPHAGOGASTRODUODENOSCOPY (EGD);  Surgeon: Irene Shipper, MD;  Location: [facility] ENDOSCOPY;  Service: Endoscopy;  Laterality: N/A;  . Givens capsule study N/A 04/30/2013    Procedure: GIVENS CAPSULE STUDY;  Surgeon: Gatha Mayer, MD;  Location: [place];  Service: Endoscopy;  Laterality: N/A;  . Loop recorder implant N/A 12/10/2011    Procedure: LOOP RECORDER IMPLANT;  Surgeon: Evans Lance, MD;  Location: [facility] CATH LAB;  Service: Cardiovascular;  Laterality: N/A;  . Permanent pacemaker insertion N/A 08/21/2012    Procedure: PERMANENT PACEMAKER INSERTION;  Surgeon: Evans Lance, MD;  Location: [facility] CATH LAB;  Service: Cardiovascular;  Laterality: N/A;  . Left and right heart catheterization with coronary angiogram N/A 04/07/2013    Procedure: LEFT AND RIGHT HEART CATHETERIZATION WITH CORONARY ANGIOGRAM;  Surgeon: Larey Dresser, MD;  Location: [facility] CATH LAB;  Service: Cardiovascular;  Laterality: N/A;  . Percutaneous coronary stent intervention (pci-s) N/A 04/09/2013    Procedure: PERCUTANEOUS CORONARY STENT INTERVENTION (PCI-S);  Surgeon: Burnell Blanks, MD;  Location: [facility] CATH LAB;  Service: Cardiovascular;  Laterality: N/A;  . Left heart catheterization with coronary/graft angiogram N/A 06/07/2014    Procedure: LEFT HEART CATHETERIZATION WITH Beatrix Fetters;  Surgeon: Burnell Blanks, MD;  Location: [facility] CATH LAB;  Service: Cardiovascular;  Laterality: N/A;  . Mastectomy Left  05/17/2015    total   . Total mastectomy Left 05/17/2015    Procedure: LEFT TOTAL MASTECTOMY;  Surgeon: Rolm Bookbinder, MD;  Location: [place];  Service: General;  Laterality: Left;     Current Outpatient Prescriptions  Medication Sig Dispense Refill  . acyclovir cream (ZOVIRAX) 5 % 1 application as needed for cold sores    . Alirocumab 75 MG/ML SOPN Inject 75 mg into the skin every 14 (fourteen) days. 2 pen 11  . ALPRAZolam (XANAX) 0.5 MG tablet Take 0.5 mg by mouth at bedtime. May take additional dose daily for anxiety    . APIDRA 100 UNIT/ML injection INFUSE THROUGH INSULIN PUMP UTD. Has been receiving 82-125 units daily over last 10 days.  Asked pt to bring with her on admission 10/25  2  . aspirin EC 81 MG tablet Take 81 mg by mouth at bedtime.    Marland Kitchen azithromycin (ZITHROMAX) 500 MG tablet 1  tablet by mouth one hour prior to dental appointment/procedure 2 tablet 0  . Calcium Carbonate-Vitamin D (RA CALCIUM PLUS VITAMIN D) 600-400 MG-UNIT per tablet Take 1 tablet by mouth 2 (two) times daily.     . Cholecalciferol 2000 UNITS TABS Take 1 tablet by mouth daily.    . ciprofloxacin (CIPRO) 750 MG tablet Take 1 tablet by mouth as directed.    . clobetasol (TEMOVATE) 0.05 % external solution Apply 1 application topically daily as needed (for hives).     Marland Kitchen desoximetasone (TOPICORT) 0.05 % cream Apply 1 application topically 2 (two) times daily as needed. When skin starts peeling    . diclofenac sodium (VOLTAREN) 1 % GEL Apply topically as needed (for body aches).     . docusate sodium (COLACE) 100 MG capsule Take 100 mg by mouth 2 (two) times daily.    . Fe Fum-FA-B Cmp-C-Zn-Mg-Mn-Cu (HEMATINIC PLUS COMPLEX) 106-1 MG TABS Take 1 tablet daily with with a Vitamin C tablet 90 each 3  . furosemide (LASIX) 80 MG tablet TAKE 1 TABLET BY MOUTH DAILY 90 tablet 1  . glucagon (GLUCAGON EMERGENCY) 1 MG injection Inject 1 mg into the vein once as needed (for severe reaction).    . hydrocortisone (ANUSOL-HC)  25 MG suppository Place 25 mg rectally 2 (two) times daily.    . isosorbide mononitrate (IMDUR) 60 MG 24 hr tablet TAKE 1 TABLET BY MOUTH ONCE DAILY 90 tablet 2  . Magnesium Oxide (MAG-OXIDE PO) Take 250 mg by mouth daily.    . metoprolol (LOPRESSOR) 50 MG tablet Take 1.5 tablets (75 mg total) by mouth 2 (two) times daily. 270 tablet 6  . metroNIDAZOLE (FLAGYL) 500 MG tablet Take 500 mg by mouth 3 (three) times daily.    . Multiple Vitamins-Minerals (MULTIVITAMIN WITH MINERALS) tablet Take 1 tablet by mouth daily.      . nitroGLYCERIN (NITROLINGUAL) 0.4 MG/SPRAY spray Place 1 spray under the tongue every 5 (five) minutes as needed for chest pain. 12 g 1  . omeprazole (PRILOSEC) 20 MG capsule Take 20 mg by mouth daily.    . ondansetron (ZOFRAN) 4 MG tablet Take 1 tablet (4 mg total) by mouth every 6 (six) hours. 12 tablet 0  . ONE TOUCH ULTRA TEST test strip 1 each by Other route as needed for other.     . potassium chloride SA (K-DUR,KLOR-CON) 20 MEQ tablet Take 40 mEq by mouth every morning.    . pravastatin (PRAVACHOL) 10 MG tablet TAKE 1 TABLET BY MOUTH ONCE DAILY 90 tablet 1  . ticagrelor (BRILINTA) 60 MG TABS tablet Take 60 mg by mouth 2 (two) times daily.    . valACYclovir (VALTREX) 1000 MG tablet Take 1,000 mg by mouth 3 (three) times daily. X 7 days for cold sores    . vancomycin (VANCOCIN HCL) 125 MG capsule Take 1 capsule (125 mg total) by mouth 4 (four) times daily. 56 capsule 0  . vitamin C (ASCORBIC ACID) 250 MG tablet Take 250 mg by mouth 2 (two) times daily.     No current facility-administered medications for this visit.    Allergies:   Cephalexin; Clindamycin hcl; Clindamycin/lincomycin; Humalog; Novolog; Penicillins; Tizanidine; Crestor; Gentamycin; Limonene; Atorvastatin; Avandia; Cholestyramine; Codeine; Metformin; Nickel; Plavix; Pravastatin; Ranitidine; Simvastatin; Tamiflu; Tramadol; Erythromycin; Iodinated diagnostic agents; Latex; Neosporin; and  Neomycin-bacitracin-polymyxin    Social History:  The patient  reports that she has never smoked. She has never used smokeless tobacco. She reports that she does not drink alcohol or  use illicit drugs.   Family History:  The patient's family history includes Breast cancer in her sister and sister; Cervical cancer in her sister; Diabetes in her father; Glaucoma in her brother; Heart attack in her father and mother; Heart disease in her father and mother; Hypertension in her brother; Lung cancer in her sister and sister; Ovarian cancer in her sister. There is no history of Colon cancer or Stomach cancer.    ROS:  Please see the history of present illness.   All other systems are reviewed and negative.    PHYSICAL EXAM: VS:  BP 144/70 mmHg  Pulse 72  Ht _0  (1.6 m)  Wt 124 lb 9.6 oz (56.518 kg)  BMI 22.08 kg/m2 , BMI Body mass index is 22.08 kg/(m^2). GEN: Well nourished, well developed, in no acute distress HEENT: normal Neck: no JVD, carotid bruits, or masses Cardiac: RRR; no murmurs, rubs, or gallops,no edema  Respiratory:  clear to auscultation bilaterally, normal work of breathing GI: soft, nontender, nondistended, + BS MS: no deformity or atrophy Skin: warm and dry  Neuro:  Strength and sensation are intact Psych: euthymic mood, full affect  EKG:  EKG is ordered today. EKG today shows SR  Recent Labs: 10/11/2015: TSH 1.553 11/12/2015: ALT 50; BUN 28*; Creatinine, Ser 1.21*; Hemoglobin 12.3; Platelets 173; Potassium 3.9; Sodium 143    Lipid Panel     Component Value Date/Time   CHOL 115* 11/04/2015 0831   TRIG 207* 11/04/2015 0831   HDL 41* 11/04/2015 0831   CHOLHDL 2.8 11/04/2015 0831   VLDL 41* 11/04/2015 0831   LDLCALC 33 11/04/2015 0831   LDLDIRECT 43.0 12/14/2014 0938     Wt Readings from Last 3 Encounters:  11/21/15 124 lb 9.6 oz (56.518 kg)  11/12/15 126 lb 3 oz (57.238 kg)  11/09/15 126 lb (57.153 kg)      Other studies Reviewed: Additional studies/  records that were reviewed today include: [name] and Dr Tanna Furry office notes   ASSESSMENT AND PLAN:  1.  Paroxysmal atrial fibrillation I have seen [name] [name] is a 65 y.o. female in the office today who has been referred by [name] for a Watchman left atrial appendage closure device.  She has a history of paroxysmal atrial fibrillation.  This patients CHA2DS2-VASc Score and unadjusted Ischemic Stroke Rate (% per year) is equal to 7.2 % stroke rate/year from a score of 5 which necessitates long term oral anticoagulation to prevent stroke. HasBled score is 5.  Modified Rankin Score is 0. Unfortunately, she is not felt to be a long term Warfarin candidate secondary to prior significant GI bleeding requiring transfusion and need for lifelong DAPT.  The patients chart has been reviewed and I along with their referring cardiologist feel that they would be a candidate for short term oral anticoagulation.  Procedural risks for the Watchman implant have been reviewed with the patient including a 1% risk of stroke, 2% risk of perforation, 0.1% risk of device embolization.  Given the patient's poor candidacy for long-term oral anticoagulation, ability to tolerate short term oral anticoagulation, I have recommended the watchman left atrial appendage closure system. She had a TEE with Dr Dannielle Burn in 2011 post mantle radiation. Will attempt to obtain those images. If they are unable to be obtained or if LAA not clearly visualized, will need to repeat TEE.  She and her husband have several trips planned this spring and summer. Would not be able to do Watchman until after June  as per the patient's preference.    Follow-up:  EP NP by phone after TEE images reviewed.   Current medicines are reviewed at length with the patient today.   The patient does not have concerns regarding her medicines.  The following changes were made today:  none  Labs/ tests ordered today include: none   Signed, Thompson Grayer,  MD  [place] [address] [phone] (office) [phone] (fax)

## 2015-11-21 NOTE — Patient Instructions (Signed)
Medication Instructions:  Your physician recommends that you continue on your current medications as directed. Please refer to the Current Medication list given to you today.   Labwork: None ordered   Testing/Procedures: None ordered Will try and obtain images of TEE done in 2011  Follow-Up:  Your physician recommends that you schedule a follow-up appointment Chanetta Marshall, NP will call you after we review TEE from 2011     Any Other Special Instructions Will Be Listed Below (If Applicable).     If you need a refill on your cardiac medications before your next appointment, please call your pharmacy.

## 2015-11-25 ENCOUNTER — Telehealth: Payer: Self-pay | Admitting: Nurse Practitioner

## 2015-11-25 NOTE — Telephone Encounter (Signed)
Received TEE images from [facility] from 2011, but unable to view them. Pt will need TEE to closely evaluate LAA appendage to determine suitability for Watchman.  I am out of the office after 3PM today through next week.  If patient calls back, please let her know that we will need to repeat TEE (we discussed this possibility at office visit).  TEE should be scheduled with [name] as he knows patient well.    Chanetta Marshall, NP 11/25/2015 10:29 AM

## 2015-11-28 ENCOUNTER — Encounter: Payer: Self-pay | Admitting: *Deleted

## 2015-11-28 NOTE — Telephone Encounter (Signed)
F/u  Pt returning Amber call- per previous note- needs repeat TEE w/ [name]. Please call back and discuss.

## 2015-11-28 NOTE — Telephone Encounter (Signed)
I spoke with the patient. She is aware of Amber's recommendations for repeat TEE. This has been arranged with Dr. Aundra Dubin Wednesday 12/07/15 at 9:00 am. Verbal instructions have been given to the patient. Letter of instructions will be mailed.

## 2015-12-01 ENCOUNTER — Other Ambulatory Visit: Payer: Self-pay

## 2015-12-01 DIAGNOSIS — D0510 Intraductal carcinoma in situ of unspecified breast: Secondary | ICD-10-CM

## 2015-12-04 ENCOUNTER — Other Ambulatory Visit: Payer: Self-pay | Admitting: Oncology

## 2015-12-05 ENCOUNTER — Encounter: Payer: Self-pay | Admitting: Oncology

## 2015-12-05 ENCOUNTER — Telehealth: Payer: Self-pay | Admitting: Oncology

## 2015-12-05 ENCOUNTER — Ambulatory Visit (HOSPITAL_BASED_OUTPATIENT_CLINIC_OR_DEPARTMENT_OTHER): Payer: Medicare Other | Admitting: Oncology

## 2015-12-05 ENCOUNTER — Other Ambulatory Visit (HOSPITAL_BASED_OUTPATIENT_CLINIC_OR_DEPARTMENT_OTHER): Payer: Medicare Other

## 2015-12-05 VITALS — BP 130/59 | HR 79 | Temp 97.6°F | Resp 18 | Wt 123.6 lb

## 2015-12-05 DIAGNOSIS — Z862 Personal history of diseases of the blood and blood-forming organs and certain disorders involving the immune mechanism: Secondary | ICD-10-CM | POA: Diagnosis not present

## 2015-12-05 DIAGNOSIS — D0510 Intraductal carcinoma in situ of unspecified breast: Secondary | ICD-10-CM

## 2015-12-05 DIAGNOSIS — Z1239 Encounter for other screening for malignant neoplasm of breast: Secondary | ICD-10-CM

## 2015-12-05 DIAGNOSIS — Z8571 Personal history of Hodgkin lymphoma: Secondary | ICD-10-CM

## 2015-12-05 DIAGNOSIS — Z923 Personal history of irradiation: Secondary | ICD-10-CM

## 2015-12-05 DIAGNOSIS — Z7901 Long term (current) use of anticoagulants: Secondary | ICD-10-CM

## 2015-12-05 DIAGNOSIS — D696 Thrombocytopenia, unspecified: Secondary | ICD-10-CM | POA: Diagnosis not present

## 2015-12-05 DIAGNOSIS — D0512 Intraductal carcinoma in situ of left breast: Secondary | ICD-10-CM | POA: Diagnosis not present

## 2015-12-05 DIAGNOSIS — R922 Inconclusive mammogram: Secondary | ICD-10-CM

## 2015-12-05 LAB — CBC WITH DIFFERENTIAL/PLATELET
BASO%: 0.8 % (ref 0.0–2.0)
Basophils Absolute: 0.1 10*3/uL (ref 0.0–0.1)
EOS%: 2 % (ref 0.0–7.0)
Eosinophils Absolute: 0.2 10*3/uL (ref 0.0–0.5)
HCT: 41.8 % (ref 34.8–46.6)
HGB: 13.4 g/dL (ref 11.6–15.9)
LYMPH%: 13.4 % — AB (ref 14.0–49.7)
MCH: 30.1 pg (ref 25.1–34.0)
MCHC: 32 g/dL (ref 31.5–36.0)
MCV: 94.1 fL (ref 79.5–101.0)
MONO#: 0.4 10*3/uL (ref 0.1–0.9)
MONO%: 4.1 % (ref 0.0–14.0)
NEUT%: 79.7 % — ABNORMAL HIGH (ref 38.4–76.8)
NEUTROS ABS: 8.5 10*3/uL — AB (ref 1.5–6.5)
Platelets: 140 10*3/uL — ABNORMAL LOW (ref 145–400)
RBC: 4.44 10*6/uL (ref 3.70–5.45)
RDW: 14.4 % (ref 11.2–14.5)
WBC: 10.7 10*3/uL — AB (ref 3.9–10.3)
lymph#: 1.4 10*3/uL (ref 0.9–3.3)

## 2015-12-05 LAB — COMPREHENSIVE METABOLIC PANEL
ALT: 35 U/L (ref 0–55)
AST: 29 U/L (ref 5–34)
Albumin: 4 g/dL (ref 3.5–5.0)
Alkaline Phosphatase: 91 U/L (ref 40–150)
Anion Gap: 9 mEq/L (ref 3–11)
BILIRUBIN TOTAL: 0.48 mg/dL (ref 0.20–1.20)
BUN: 25.6 mg/dL (ref 7.0–26.0)
CHLORIDE: 101 meq/L (ref 98–109)
CO2: 31 meq/L — AB (ref 22–29)
CREATININE: 1.3 mg/dL — AB (ref 0.6–1.1)
Calcium: 9.9 mg/dL (ref 8.4–10.4)
EGFR: 43 mL/min/{1.73_m2} — ABNORMAL LOW (ref >90)
GLUCOSE: 178 mg/dL — AB (ref 70–140)
Potassium: 4 mEq/L (ref 3.5–5.1)
SODIUM: 142 meq/L (ref 136–145)
TOTAL PROTEIN: 7.6 g/dL (ref 6.4–8.3)

## 2015-12-05 NOTE — Progress Notes (Signed)
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 She had mantle radiation for Hodgkins in 1980s, renal cell carcinoma post right nephrectomy 2008. She is on Brilinta + ASA for cardiac stent and atrial fibrillation.  Other significant comorbid conditions include Type II DM on insulin pump and radiation induced hypothyroidism. She was treated for C difficile + campylobacter diarrhea by [facility] in late April (oral flagy / metronidaxole x  4 weeks completes today,  with cipro first 10 days) after 3 weeks of diarrhea prior; she was seen in ED for check of hydration and renal status day after GI visit when labs resulted. She had gout for first time just prior to C diff, resolved with steroid injection + tramadol.  Last mammograms [facility] 03-14-15. US soft tissue beneath left mastectomy [facility] 10-18-15.   Patient is not aware of any change in left mastectomy area or right breast. The soft tissue fullness below left mastectomy scar is unchanged, with [name] of that area done at [facility] 10-18-15 no findings of concern. WIth extent of hematoma post operatively, [name] is not surprised that there could be some residual fullness ; he is glad to see her about this also, which we will set up. Patient has not been able to find bras that are not uncomfortable on that side.  She will be due right mammogram at [facility] in Aug.   She is for TEE this week due to longer episodes of atrial fibrillation, considering left atrial appendage closure.  Patient feels much better today as diarrhea has improved. Patient has had no overt bleeding, including with the  recent infectious diarrhea. She has had no diarrhea x 4 days, tho stools still 2-3x daily and very soft. We have discussed propensity of C diff to reoccur (especially with concomitant cipro initially), patient instructed to let physician know if any frank diarrhea. No fever. Blood sugars monitored closely. No increased SOB, no angina, no bladder symptoms, no LE swelling.  Remainder of 10 point Review of Systems negative.   She has pacemaker, so cannot have breast MRI. No PAC Has not had genetics testing. Flu vaccine 04-18-15  She and husband are taking granddaughters on tour of [place] in early June, in mobile home.  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 She has radiation induced hypothyroidism and the treatment caused or worsened coronary artery disease. She has had no further active Hodgkins.   Renal Cell Carcinoma: She had right nephrectomy for renal cell carcinoma 2008: T1bNx chromophobe variant Fuhrman Nuclear Grade III-IV with negative margins, no renal vein invasion, no extension thru capsule at right nephrectomy 06-11-2007 by [name], path [id number]. She is no longer followed regularly by urology.   DCIS left breast identified on screening tomo mammograms done at [facility] 03-14-15, with heterogeneously dense breast tissue, heterogeneous calcification left breast at 3:00 posterior and otherwise no findings of concern. She had left diagnostic mammogram at [facility] 03-16-15 with suspicious grouped calcifications identified. She had tomo stereotactic needle core biopsy by [name] at [place] on 03-21-14, seven cores submitted, pathology DCIS with  necrosis, grade 2, ER PR negative ([place] [phone]). She had [name] left axilla at [facility] 03-29-15 with no evidence of malignancy. After input from general surgery, radiation oncology, cardiology and medical  oncology, she had left total mastectomy by [name] on 05-17-15. Pathology [phone]) multiple foci grade 3 DCIS largest area 1 cm, without necrosis, closest margin anterior 56m.    Significant PMH: She has had intermittent GI bleeding in past 2 years. Brilinta and ASA were not held for that bleeding; she continues ferrous fumarate, last iron studies in 10-2014 had serum iron 68 and %sat 23. She has had some variable mildly low platelets in past year.  [name] and for hypothyroidism on replacement (hypothyroid also related to remote mantle radiation for Hodgkins). Diabetes is also complicated by gastroparesis.  [facility] no [facility] of spleen  Variable mild thrombocytopenia. CT angio pelvis 09-2014 "normal spleen". Abd [place] spleen size [facility] 10-26-15 normal, with estimated splenic volume 203 mg (83 - 412)    Objective:  Vital signs in last 24 hours:  BP 130/59 mmHg  Pulse 79  Temp(Src) 97.6 F (36.4 C) (Oral)  Resp 18  Wt 123 lb 9.6 oz (56.065 kg)  SpO2 96% Weight down 6 lbs with recent infectious diarrhea Alert, oriented and appropriate. Ambulatory without difficulty. Respirations not labored. Looks comfortable. Excellent historian as always No alopecia  HEENT:PERRL, sclerae not icteric. Oral mucosa moist without lesions, posterior pharynx clear.  Neck supple. No JVD.  Lymphatics:no cervical,supraclavicular, axillary adenopathy Resp: clear to auscultation bilaterally and normal percussion bilaterally Cardio: regular rate and rhythm. No gallop. GI: soft, nontender, not distended, no mass or organomegaly. Normally active bowel sounds. Insulin pump in place Musculoskeletal/ Extremities: without pitting edema, cords, tenderness Neuro: no change peripheral neuropathy. Otherwise nonfocal. PSYCH appropriate mood and  affect Skin without rash, ecchymosis, petechiae Breasts: right without dominant mass, skin or nipple findings. Left mastectomy scar well healed without evidence of local recurrence. Soft tissue fullness beneath mastectomy scar without tenderness, erythema or other findings of concern. Axillae benign.   Lab Results:  Results for orders placed or performed in visit on 12/05/15  CBC with Differential  Result Value Ref Range   WBC 10.7 (H) 3.9 - 10.3 10e3/uL   NEUT# 8.5 (H) 1.5 - 6.5 10e3/uL   HGB 13.4 11.6 - 15.9 g/dL   HCT 41.8 34.8 - 46.6 %   Platelets 140 (L) 145 - 400 10e3/uL   MCV 94.1 79.5 - 101.0 fL   MCH 30.1 25.1 - 34.0 pg   MCHC 32.0 31.5 - 36.0 g/dL   RBC 4.44 3.70 - 5.45 10e6/uL   RDW 14.4 11.2 - 14.5 %   lymph# 1.4 0.9 - 3.3 10e3/uL   MONO# 0.4 0.1 - 0.9 10e3/uL   Eosinophils Absolute 0.2 0.0 - 0.5 10e3/uL   Basophils Absolute 0.1 0.0 - 0.1 10e3/uL   NEUT% 79.7 (H) 38.4 - 76.8 %   LYMPH% 13.4 (L) 14.0 - 49.7 %   MONO% 4.1 0.0 - 14.0 %   EOS% 2.0 0.0 - 7.0 %   BASO% 0.8 0.0 - 2.0 %  Comprehensive metabolic panel  Result Value Ref Range   Sodium 142 136 - 145 mEq/L   Potassium 4.0 3.5 - 5.1 mEq/L   Chloride 101 98 - 109 mEq/L   CO2 31 (  H) 22 - 29 mEq/L   Glucose 178 (H) 70 - 140 mg/dl   BUN 25.6 7.0 - 26.0 mg/dL   Creatinine 1.3 (H) 0.6 - 1.1 mg/dL   Total Bilirubin 0.48 0.20 - 1.20 mg/dL   Alkaline Phosphatase 91 40 - 150 U/L   AST 29 5 - 34 U/L   ALT 35 0 - 55 U/L   Total Protein 7.6 6.4 - 8.3 g/dL   Albumin 4.0 3.5 - 5.0 g/dL   Calcium 9.9 8.4 - 10.4 mg/dL   Anion Gap 9 3 - 11 mEq/L   EGFR 43 (L) >90 ml/min/1.73 m2   *Note: Due to a large number of results and/or encounters for the requested time period, some results have not been displayed. A complete set of results can be found in Results Review.   Labs reviewed with patient at time of visit Platelets are improved by these labs, and hemoglobin in good range still.    Studies/Results: [facility] [name] of  tissue below left mastectomy 11-15-15 noted, scanned into this EMR  LIMITED ABDOMINAL ULTRASOUND  10-26-15  COMPARISON: CT Abdomen and Pelvis 07/11/2012  FINDINGS: Splenic size and configuration appears stable since the 2013 CT Abdomen and Pelvis. Estimated splenic volume today is 203 mL (normal splenic volume range 83 - 412 mL). No discrete splenic lesion. No left upper quadrant free fluid.  IMPRESSION: Splenic size and configuration appears normal and not significantly changed since the 2013 CT Abdomen and Pelvis. Estimated splenic volume today is 203 mL (normal splenic volume range 83 - 412 mL).  Results of [facility] [name] and abdominal US communicated to patient prior to this visit.    Medications: I have reviewed the patient's current medications.  DISCUSSION Interval history, C diff information as above Reviewed left chest wall [name] and spleen US information, reviewed most recent CBCs including platelet count. She would like to see [name] as he offered, will try to coordinate that visit with other schedule.  I will see her back in late Aug after right mammogram.   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 Post left total mastectomy 05-17-15, now on observation.  As this DCIS was likely caused result of remote mantle radiation, she is at risk also in remaining right breast, due tomo mammograms at [facility] in Aug. She will have f/u with [name], but seems reassured that soft tissue fullness below left mastectomy is not of concern other than asthetically.   2.variable, mild thrombocytopenia for at over a year, possibly medication related. No bleeding even on brilinta and ASA which are medically necessary due to cardiac disease. Spleen size normal by [name] 10-26-15. Follow 3.previous anemia related to intermittent slow GI bleeding, last iron studies good, not anemic now. Colonoscopy 06-2012 no polyps, thought to have had ischemic colitis 2014.  NOTE needs lasix if PRBCs. 4. CAD s/p CABG in 12/05 and redo in 1/11, bioprosthetic AVR, diastolic CHF, and sick sinus syndrome s/p PCM. DES from left main into proximal LAD 03-2013; re-developed angina in 11/15 with DES to ostial LM. Last echo 11/15 EF 55-60%, normal bioprosthetic aortic valve, mild-moderate MR, moderate TR.On Brilinta and ASA as poor responder to plavix previously. Paroxysmal A fib evaluated at ED 05-28-15. Followed closely by [name]. Upcoming TEE for possible procedure to place filter in atrial appendage. 5.IIA nodular sclerosing Hodgkins treated with mantle radiation in 1980s, not recurrent. Radiation likely pertinent to hypothyroidism, CAD and breast cancer. 6.type II diabetes on insulin pump:  followed by Dr Frederik Pear at [facility], on insulin pump. Diabetic gastroparesis. 7.Carotid stenosis: Carotid dopplers (2/15) with 40-59% bilateral stenosis. Carotid dopplers (3/16) with 40-59% bilateral ICA stenosis.  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 EGD 05-2013 no varices. No splenomegaly by [name] 10-2015 9..post right nephrectomy for renal cell ca 2008. She is followed yearly by Dr Lorrene Reid of nephrology, but stable without other concerns at visit 03-2015. 10. Hypothyroidism on replacement, also followed by Dr Frederik Pear. 11.osteopenia by bone density scan [facility] 11-23-14 12.post TAH BSO 13.C diff and campylobacter GI infections, just completing 4 weeks flagyl and had 10 days cipro at start of the flagyl. Per GI at [facility] 14.recent gout in great toe, resolved quickly  All questions answered. Time spent 25 min including >50% counseling and coordination of care. Route note PCP, cc Drs Frederik Pear and Deal, in [facility] for cardiology    [name], MD   12/05/2015, 2:34 PM

## 2015-12-05 NOTE — Telephone Encounter (Signed)
Gave and printed appt sched and avs for pt for Aug and Sept...[facility] 8.24 @ 9:30

## 2015-12-07 ENCOUNTER — Ambulatory Visit (HOSPITAL_COMMUNITY)
Admission: RE | Admit: 2015-12-07 | Discharge: 2015-12-07 | Disposition: A | Discharge status: Home / Self Care | Payer: Medicare Other | Source: Ambulatory Visit | Attending: Cardiology | Admitting: Cardiology

## 2015-12-07 ENCOUNTER — Ambulatory Visit (HOSPITAL_BASED_OUTPATIENT_CLINIC_OR_DEPARTMENT_OTHER): Payer: Medicare Other

## 2015-12-07 ENCOUNTER — Encounter (HOSPITAL_COMMUNITY)
Admission: RE | Disposition: A | Discharge status: Home / Self Care | Payer: Self-pay | Source: Ambulatory Visit | Attending: Cardiology

## 2015-12-07 ENCOUNTER — Encounter (HOSPITAL_COMMUNITY): Payer: Self-pay | Admitting: *Deleted

## 2015-12-07 DIAGNOSIS — E785 Hyperlipidemia, unspecified: Secondary | ICD-10-CM | POA: Diagnosis not present

## 2015-12-07 DIAGNOSIS — I480 Paroxysmal atrial fibrillation: Secondary | ICD-10-CM | POA: Diagnosis not present

## 2015-12-07 DIAGNOSIS — Z79899 Other long term (current) drug therapy: Secondary | ICD-10-CM | POA: Diagnosis not present

## 2015-12-07 DIAGNOSIS — Q231 Congenital insufficiency of aortic valve: Secondary | ICD-10-CM | POA: Insufficient documentation

## 2015-12-07 DIAGNOSIS — E1122 Type 2 diabetes mellitus with diabetic chronic kidney disease: Secondary | ICD-10-CM | POA: Insufficient documentation

## 2015-12-07 DIAGNOSIS — I5032 Chronic diastolic (congestive) heart failure: Secondary | ICD-10-CM | POA: Diagnosis not present

## 2015-12-07 DIAGNOSIS — I130 Hypertensive heart and chronic kidney disease with heart failure and stage 1 through stage 4 chronic kidney disease, or unspecified chronic kidney disease: Secondary | ICD-10-CM | POA: Diagnosis not present

## 2015-12-07 DIAGNOSIS — I4891 Unspecified atrial fibrillation: Secondary | ICD-10-CM

## 2015-12-07 DIAGNOSIS — N182 Chronic kidney disease, stage 2 (mild): Secondary | ICD-10-CM | POA: Insufficient documentation

## 2015-12-07 DIAGNOSIS — Z7982 Long term (current) use of aspirin: Secondary | ICD-10-CM | POA: Insufficient documentation

## 2015-12-07 DIAGNOSIS — Z8571 Personal history of Hodgkin lymphoma: Secondary | ICD-10-CM | POA: Insufficient documentation

## 2015-12-07 DIAGNOSIS — E1143 Type 2 diabetes mellitus with diabetic autonomic (poly)neuropathy: Secondary | ICD-10-CM | POA: Insufficient documentation

## 2015-12-07 DIAGNOSIS — M8580 Other specified disorders of bone density and structure, unspecified site: Secondary | ICD-10-CM | POA: Diagnosis not present

## 2015-12-07 DIAGNOSIS — Z794 Long term (current) use of insulin: Secondary | ICD-10-CM | POA: Insufficient documentation

## 2015-12-07 DIAGNOSIS — Z955 Presence of coronary angioplasty implant and graft: Secondary | ICD-10-CM | POA: Diagnosis not present

## 2015-12-07 DIAGNOSIS — Z951 Presence of aortocoronary bypass graft: Secondary | ICD-10-CM | POA: Diagnosis not present

## 2015-12-07 DIAGNOSIS — I340 Nonrheumatic mitral (valve) insufficiency: Secondary | ICD-10-CM | POA: Diagnosis not present

## 2015-12-07 DIAGNOSIS — Z950 Presence of cardiac pacemaker: Secondary | ICD-10-CM | POA: Diagnosis not present

## 2015-12-07 DIAGNOSIS — Z7902 Long term (current) use of antithrombotics/antiplatelets: Secondary | ICD-10-CM | POA: Insufficient documentation

## 2015-12-07 DIAGNOSIS — I2510 Atherosclerotic heart disease of native coronary artery without angina pectoris: Secondary | ICD-10-CM | POA: Diagnosis not present

## 2015-12-07 DIAGNOSIS — K3184 Gastroparesis: Secondary | ICD-10-CM | POA: Diagnosis not present

## 2015-12-07 HISTORY — PX: TEE WITHOUT CARDIOVERSION: SHX5443

## 2015-12-07 LAB — GLUCOSE, CAPILLARY: Glucose-Capillary: 108 mg/dL — ABNORMAL HIGH (ref 65–99)

## 2015-12-07 SURGERY — ECHOCARDIOGRAM, TRANSESOPHAGEAL
Anesthesia: Moderate Sedation

## 2015-12-07 MED ORDER — FENTANYL CITRATE (PF) 100 MCG/2ML IJ SOLN
INTRAMUSCULAR | Status: AC
  Filled 2015-12-07: qty 2

## 2015-12-07 MED ORDER — MIDAZOLAM HCL 10 MG/2ML IJ SOLN
INTRAMUSCULAR | Status: DC | PRN
  Administered 2015-12-07 (×2): 2 mg via INTRAVENOUS
  Administered 2015-12-07: 1 mg via INTRAVENOUS

## 2015-12-07 MED ORDER — FENTANYL CITRATE (PF) 100 MCG/2ML IJ SOLN
INTRAMUSCULAR | Status: DC | PRN
  Administered 2015-12-07 (×2): 25 ug via INTRAVENOUS

## 2015-12-07 MED ORDER — BUTAMBEN-TETRACAINE-BENZOCAINE 2-2-14 % EX AERO
INHALATION_SPRAY | CUTANEOUS | Status: DC | PRN
  Administered 2015-12-07: 2 via TOPICAL

## 2015-12-07 MED ORDER — SODIUM CHLORIDE 0.9 % IV SOLN
INTRAVENOUS | Status: DC
  Administered 2015-12-07: 500 mL via INTRAVENOUS

## 2015-12-07 MED ORDER — MIDAZOLAM HCL 5 MG/ML IJ SOLN
INTRAMUSCULAR | Status: AC
  Filled 2015-12-07: qty 2

## 2015-12-07 NOTE — Discharge Instructions (Signed)

## 2015-12-07 NOTE — H&P (View-Only) (Signed)
 Watchman Consult Note   Date:  11/21/2015   ID:  [name] [name], DOB 11/18/1950, MRN [id number]  PCP:  GOLDING, JOHN CABOT, MD  Cardiologist:  McLean Primary Electrophysiologist: Taylor Referring Physician: McLean   CC: to discuss Watchman implant    History of Present Illness: [name] [name] is a 64 y.o. female who presents today for evaluation of left atrial appendage occluder.  She has paroxysmal atrial fibrillation as well as GI bleeding requiring transfusions in the past.  The patient has been evaluated by their referring physician and is felt to be a poor candidate for long term OAC due to GI bleeding and need for long term DAPT.  She therefore presents today for Watchman evaluation.    Today, she denies symptoms of palpitations, chest pain, shortness of breath, orthopnea, PND, lower extremity edema, claudication, dizziness, presyncope, syncope, bleeding, or neurologic sequela. The patient is tolerating medications without difficulties and is otherwise without complaint today.    Past Medical History  Diagnosis Date  . Aortic stenosis     a. bicuspid aortic valve; mean gradient of 20 mmHg in 2/10; b. s/p bioprosth AVR (19-mm Edwards pericardial valve) with a redo coronary artery bypass graft procedure in 07/2009; postoperative Dressler's syndrome    . Coronary artery disease     a. initial CABG with SVG-RCA in 12/05. b. redo SVG-RCA and bioprosthetic AVR in 1/11. d. 9/14 - PCI with DES to distal LM/proximal LAD was done rather than bypass as she would have been a poor candidate for redo sternotomy. d. S/p DES to prox LM 05/2014.  . Chronic diastolic CHF (congestive heart failure) (HCC)     a. 9.2014 EchoP EF 60-65%, no rwma, bioprosth AVR, mean gradient of 15mmHg, mild to mod MR, PASP 51mmHg.  . Hyperlipidemia   . Hypertension   . Small bowel obstruction (HCC)     Recurrent; resolved after resection of a lipoma  . Osteopenia      hip on DEXA in October 2007.  .  Carcinoma, renal cell (HCC) 05/2007    Laparoscopic right nephrectomy  . NASH (nonalcoholic steatohepatitis) 1999    -biopsy in 1999  . Diabetes mellitus 03/2010     TYPE II: Hemoglobin A1c of 7.4 in  03/2010; 8.4 in 06/2010; treated with insulin pump  . Migraines   . Duodenal ulcer     Remote; H. pylori positive  . Colitis, ischemic (HCC) 2008  . Hodgkin's disease (HCC) 1991    Mantle radiation therapy  . Iron deficiency anemia     a. 03/2013 EGD: essentially normal. Possible slow GIB.  . Gastroparesis due to DM (HCC) 05/01/2012  . Hyperplastic gastric polyp 06/23/2012  . Arthritis   . Osteoporosis   . Anxiety   . Syncope     a. H/o recurrent syncope with pauses on loop recorder s/p Medtronic pacemaker 07/2012.  . CKD (chronic kidney disease), stage II   . PAF (paroxysmal atrial fibrillation) (HCC)     a.  h/o brief episodes noted on ppm interrogation. b. given GI bleeding and need to be on both ASA 81 and Brilinta, risks likely outweigh benefits of anticoagulation.    Past Surgical History  Procedure Laterality Date  . Total abdominal hysterectomy w/ bilateral salpingoophorectomy  2000  . Breast excisional biopsy      benign, bilateral  . Nephrectomy  2008    Laparoscopic right nephrectomy, renal cell carcinoma  . Coronary artery bypass graft  2005, 2011    CABG-most   recent SVG to RCA-12/05; RCA occluded with patent graft in 2006; Redo bypass in 07/2009  . Tumor excision  1981    removal of Hodgkins lymphoma  . Exploratory laparotomy w/ bowel resection      Small bowel resection of lipoma  . Aortic valve replacement  2011    Aortic valve replacement surgery with a 19 mm bioprosthetic device post redo CABG January 2011.  . Colonoscopy      Multiple with adenomatous polyps  . Cervical biopsy    . Liver biopsy    . Bone marrow biopsy    . Esophagogastroduodenoscopy    . Esophagogastroduodenoscopy  06/23/2012    Procedure: ESOPHAGOGASTRODUODENOSCOPY (EGD);  Surgeon: Gatha Mayer, MD;  Location: [facility];  Service: Endoscopy;  Laterality: N/A;  . Colonoscopy  06/23/2012    Procedure: COLONOSCOPY;  Surgeon: Gatha Mayer, MD;  Location: [facility] ENDOSCOPY;  Service: Endoscopy;  Laterality: N/A;  [name] dilation  06/23/2012    Procedure: [name] DILATION;  Surgeon: Gatha Mayer, MD;  Location: [facility] ENDOSCOPY;  Service: Endoscopy;;  54 fr  . Pacemaker insertion  08/21/2012    Medtronic Adapta L dual-chamber pacemaker  . Esophagogastroduodenoscopy N/A 04/06/2013    Procedure: ESOPHAGOGASTRODUODENOSCOPY (EGD);  Surgeon: Irene Shipper, MD;  Location: [facility] ENDOSCOPY;  Service: Endoscopy;  Laterality: N/A;  . Givens capsule study N/A 04/30/2013    Procedure: GIVENS CAPSULE STUDY;  Surgeon: Gatha Mayer, MD;  Location: [place];  Service: Endoscopy;  Laterality: N/A;  . Loop recorder implant N/A 12/10/2011    Procedure: LOOP RECORDER IMPLANT;  Surgeon: Evans Lance, MD;  Location: [facility] CATH LAB;  Service: Cardiovascular;  Laterality: N/A;  . Permanent pacemaker insertion N/A 08/21/2012    Procedure: PERMANENT PACEMAKER INSERTION;  Surgeon: Evans Lance, MD;  Location: [facility] CATH LAB;  Service: Cardiovascular;  Laterality: N/A;  . Left and right heart catheterization with coronary angiogram N/A 04/07/2013    Procedure: LEFT AND RIGHT HEART CATHETERIZATION WITH CORONARY ANGIOGRAM;  Surgeon: Larey Dresser, MD;  Location: [facility] CATH LAB;  Service: Cardiovascular;  Laterality: N/A;  . Percutaneous coronary stent intervention (pci-s) N/A 04/09/2013    Procedure: PERCUTANEOUS CORONARY STENT INTERVENTION (PCI-S);  Surgeon: Burnell Blanks, MD;  Location: [facility] CATH LAB;  Service: Cardiovascular;  Laterality: N/A;  . Left heart catheterization with coronary/graft angiogram N/A 06/07/2014    Procedure: LEFT HEART CATHETERIZATION WITH Beatrix Fetters;  Surgeon: Burnell Blanks, MD;  Location: [facility] CATH LAB;  Service: Cardiovascular;  Laterality: N/A;  . Mastectomy Left  05/17/2015    total   . Total mastectomy Left 05/17/2015    Procedure: LEFT TOTAL MASTECTOMY;  Surgeon: Rolm Bookbinder, MD;  Location: [place];  Service: General;  Laterality: Left;     Current Outpatient Prescriptions  Medication Sig Dispense Refill  . acyclovir cream (ZOVIRAX) 5 % 1 application as needed for cold sores    . Alirocumab 75 MG/ML SOPN Inject 75 mg into the skin every 14 (fourteen) days. 2 pen 11  . ALPRAZolam (XANAX) 0.5 MG tablet Take 0.5 mg by mouth at bedtime. May take additional dose daily for anxiety    . APIDRA 100 UNIT/ML injection INFUSE THROUGH INSULIN PUMP UTD. Has been receiving 82-125 units daily over last 10 days.  Asked pt to bring with her on admission 10/25  2  . aspirin EC 81 MG tablet Take 81 mg by mouth at bedtime.    Marland Kitchen azithromycin (ZITHROMAX) 500 MG tablet 1  tablet by mouth one hour prior to dental appointment/procedure 2 tablet 0  . Calcium Carbonate-Vitamin D (RA CALCIUM PLUS VITAMIN D) 600-400 MG-UNIT per tablet Take 1 tablet by mouth 2 (two) times daily.     . Cholecalciferol 2000 UNITS TABS Take 1 tablet by mouth daily.    . ciprofloxacin (CIPRO) 750 MG tablet Take 1 tablet by mouth as directed.    . clobetasol (TEMOVATE) 0.05 % external solution Apply 1 application topically daily as needed (for hives).     Marland Kitchen desoximetasone (TOPICORT) 0.05 % cream Apply 1 application topically 2 (two) times daily as needed. When skin starts peeling    . diclofenac sodium (VOLTAREN) 1 % GEL Apply topically as needed (for body aches).     . docusate sodium (COLACE) 100 MG capsule Take 100 mg by mouth 2 (two) times daily.    . Fe Fum-FA-B Cmp-C-Zn-Mg-Mn-Cu (HEMATINIC PLUS COMPLEX) 106-1 MG TABS Take 1 tablet daily with with a Vitamin C tablet 90 each 3  . furosemide (LASIX) 80 MG tablet TAKE 1 TABLET BY MOUTH DAILY 90 tablet 1  . glucagon (GLUCAGON EMERGENCY) 1 MG injection Inject 1 mg into the vein once as needed (for severe reaction).    . hydrocortisone (ANUSOL-HC)  25 MG suppository Place 25 mg rectally 2 (two) times daily.    . isosorbide mononitrate (IMDUR) 60 MG 24 hr tablet TAKE 1 TABLET BY MOUTH ONCE DAILY 90 tablet 2  . Magnesium Oxide (MAG-OXIDE PO) Take 250 mg by mouth daily.    . metoprolol (LOPRESSOR) 50 MG tablet Take 1.5 tablets (75 mg total) by mouth 2 (two) times daily. 270 tablet 6  . metroNIDAZOLE (FLAGYL) 500 MG tablet Take 500 mg by mouth 3 (three) times daily.    . Multiple Vitamins-Minerals (MULTIVITAMIN WITH MINERALS) tablet Take 1 tablet by mouth daily.      . nitroGLYCERIN (NITROLINGUAL) 0.4 MG/SPRAY spray Place 1 spray under the tongue every 5 (five) minutes as needed for chest pain. 12 g 1  . omeprazole (PRILOSEC) 20 MG capsule Take 20 mg by mouth daily.    . ondansetron (ZOFRAN) 4 MG tablet Take 1 tablet (4 mg total) by mouth every 6 (six) hours. 12 tablet 0  . ONE TOUCH ULTRA TEST test strip 1 each by Other route as needed for other.     . potassium chloride SA (K-DUR,KLOR-CON) 20 MEQ tablet Take 40 mEq by mouth every morning.    . pravastatin (PRAVACHOL) 10 MG tablet TAKE 1 TABLET BY MOUTH ONCE DAILY 90 tablet 1  . ticagrelor (BRILINTA) 60 MG TABS tablet Take 60 mg by mouth 2 (two) times daily.    . valACYclovir (VALTREX) 1000 MG tablet Take 1,000 mg by mouth 3 (three) times daily. X 7 days for cold sores    . vancomycin (VANCOCIN HCL) 125 MG capsule Take 1 capsule (125 mg total) by mouth 4 (four) times daily. 56 capsule 0  . vitamin C (ASCORBIC ACID) 250 MG tablet Take 250 mg by mouth 2 (two) times daily.     No current facility-administered medications for this visit.    Allergies:   Cephalexin; Clindamycin hcl; Clindamycin/lincomycin; Humalog; Novolog; Penicillins; Tizanidine; Crestor; Gentamycin; Limonene; Atorvastatin; Avandia; Cholestyramine; Codeine; Metformin; Nickel; Plavix; Pravastatin; Ranitidine; Simvastatin; Tamiflu; Tramadol; Erythromycin; Iodinated diagnostic agents; Latex; Neosporin; and  Neomycin-bacitracin-polymyxin    Social History:  The patient  reports that she has never smoked. She has never used smokeless tobacco. She reports that she does not drink alcohol or  use illicit drugs.   Family History:  The patient's family history includes Breast cancer in her sister and sister; Cervical cancer in her sister; Diabetes in her father; Glaucoma in her brother; Heart attack in her father and mother; Heart disease in her father and mother; Hypertension in her brother; Lung cancer in her sister and sister; Ovarian cancer in her sister. There is no history of Colon cancer or Stomach cancer.    ROS:  Please see the history of present illness.   All other systems are reviewed and negative.    PHYSICAL EXAM: VS:  BP 144/70 mmHg  Pulse 72  Ht 5' 3" (1.6 m)  Wt 124 lb 9.6 oz (56.518 kg)  BMI 22.08 kg/m2 , BMI Body mass index is 22.08 kg/(m^2). GEN: Well nourished, well developed, in no acute distress HEENT: normal Neck: no JVD, carotid bruits, or masses Cardiac: RRR; no murmurs, rubs, or gallops,no edema  Respiratory:  clear to auscultation bilaterally, normal work of breathing GI: soft, nontender, nondistended, + BS MS: no deformity or atrophy Skin: warm and dry  Neuro:  Strength and sensation are intact Psych: euthymic mood, full affect  EKG:  EKG is ordered today. EKG today shows SR  Recent Labs: 10/11/2015: TSH 1.553 11/12/2015: ALT 50; BUN 28*; Creatinine, Ser 1.21*; Hemoglobin 12.3; Platelets 173; Potassium 3.9; Sodium 143    Lipid Panel     Component Value Date/Time   CHOL 115* 11/04/2015 0831   TRIG 207* 11/04/2015 0831   HDL 41* 11/04/2015 0831   CHOLHDL 2.8 11/04/2015 0831   VLDL 41* 11/04/2015 0831   LDLCALC 33 11/04/2015 0831   LDLDIRECT 43.0 12/14/2014 0938     Wt Readings from Last 3 Encounters:  11/21/15 124 lb 9.6 oz (56.518 kg)  11/12/15 126 lb 3 oz (57.238 kg)  11/09/15 126 lb (57.153 kg)      Other studies Reviewed: Additional studies/  records that were reviewed today include: Dr McLean and Dr Taylor's office notes   ASSESSMENT AND PLAN:  1.  Paroxysmal atrial fibrillation I have seen [name] [name] is a 64 y.o. female in the office today who has been referred by Dr McLean for a Watchman left atrial appendage closure device.  She has a history of paroxysmal atrial fibrillation.  This patients CHA2DS2-VASc Score and unadjusted Ischemic Stroke Rate (% per year) is equal to 7.2 % stroke rate/year from a score of 5 which necessitates long term oral anticoagulation to prevent stroke. HasBled score is 5.  Modified Rankin Score is 0. Unfortunately, she is not felt to be a long term Warfarin candidate secondary to prior significant GI bleeding requiring transfusion and need for lifelong DAPT.  The patients chart has been reviewed and I along with their referring cardiologist feel that they would be a candidate for short term oral anticoagulation.  Procedural risks for the Watchman implant have been reviewed with the patient including a 1% risk of stroke, 2% risk of perforation, 0.1% risk of device embolization.  Given the patient's poor candidacy for long-term oral anticoagulation, ability to tolerate short term oral anticoagulation, I have recommended the watchman left atrial appendage closure system. She had a TEE with Dr Degent in 2011 post mantle radiation. Will attempt to obtain those images. If they are unable to be obtained or if LAA not clearly visualized, will need to repeat TEE.  She and her husband have several trips planned this spring and summer. Would not be able to do Watchman until after June   as per the patient's preference.    Follow-up:  EP NP by phone after TEE images reviewed.   Current medicines are reviewed at length with the patient today.   The patient does not have concerns regarding her medicines.  The following changes were made today:  none  Labs/ tests ordered today include: none   Signed, Thompson Grayer,  MD  [place] [address] [phone] (office) [phone] (fax)

## 2015-12-07 NOTE — CV Procedure (Signed)
Procedure: TEE  Sedation: Versed 5 mg IV, Fentanyl 50 mcg IV  Indication: pre-Watchman  Findings: Please see echo section for full report.  Normal LV size with EF 60-65%.  Moderate LV hypertrophy.  Mildly dilated RV with normal systolic function.  There was moderate TR.  There was a pacer wire in the RV.  Peak RV-RA pressure 31 mmHg.  There was mild to moderate mitral regurgitation.  There was a bioprosthetic aortic valve with mean gradient 13 mmHg and no regurgitation.  It opened well.  Mild LA dilation.  No LA appendage thrombus.  LA appendage measures made on study.  Normal right atrium.  No ASD/PFO, negative bubble study.  Normal caliber aorta with grade III plaque in the descending thoracic aorta.    [name] [name] 12/07/2015 9:53 AM

## 2015-12-07 NOTE — Progress Notes (Signed)
Echocardiogram Echocardiogram Transesophageal has been performed.  Tresa Res 12/07/2015, 11:06 AM

## 2015-12-07 NOTE — Interval H&P Note (Signed)
History and Physical Interval Note:  12/07/2015 9:15 AM  [name] [name]  has presented today for surgery, with the diagnosis of AFIB  The various methods of treatment have been discussed with the patient and family. After consideration of risks, benefits and other options for treatment, the patient has consented to  Procedure(s): TRANSESOPHAGEAL ECHOCARDIOGRAM (TEE) (N/A) as a surgical intervention .  The patient's history has been reviewed, patient examined, no change in status, stable for surgery.  I have reviewed the patient's chart and labs.  Questions were answered to the patient's satisfaction.     Glyn Zendejas [employer]

## 2015-12-11 DIAGNOSIS — R922 Inconclusive mammogram: Secondary | ICD-10-CM | POA: Insufficient documentation

## 2015-12-11 DIAGNOSIS — Z7901 Long term (current) use of anticoagulants: Secondary | ICD-10-CM | POA: Insufficient documentation

## 2015-12-12 ENCOUNTER — Encounter: Payer: Self-pay | Admitting: Nurse Practitioner

## 2015-12-12 ENCOUNTER — Telehealth: Payer: Self-pay | Admitting: Nurse Practitioner

## 2015-12-12 NOTE — Telephone Encounter (Signed)
I would probably drop the aspirin rather than using triple therapy.

## 2015-12-12 NOTE — Telephone Encounter (Signed)
TEE reviewed.  Appendage with enough width and likely enough depth for implant. Discussed with patient. She would like to proceed with Watchman. Due to her vacations this summer, scheduled for 7/27 1st case.  She has appt with [name] 7/12 which she will keep. Will mail letter of instructions to patient.   Will need to discuss with [name] plan for [facility] post implant.  I think she will probably need triple therapy for 6 weeks with close H/H follow up.   Chanetta Marshall, NP 12/12/2015 3:17 PM

## 2015-12-18 NOTE — Telephone Encounter (Signed)
I agree with [name] [name]. I am not sure that we should be doing patients on Brilinta given its potency. Would prefer to switch to plavix for a short time if possible.

## 2015-12-19 NOTE — Telephone Encounter (Signed)
Difficult question.  She has been on Brilinta long-term because she was a poor responder to Plavix and has a LM stent.

## 2016-01-12 ENCOUNTER — Telehealth: Payer: Self-pay | Admitting: Internal Medicine

## 2016-01-12 NOTE — Telephone Encounter (Signed)
Pt told to call if questions -and asking where to check in for her procedure-not sure where to go  [phone]

## 2016-01-12 NOTE — Telephone Encounter (Signed)
Spoke with patient and advised her to go to the [employer]

## 2016-01-18 ENCOUNTER — Telehealth: Payer: Self-pay | Admitting: Cardiology

## 2016-01-18 MED ORDER — AMLODIPINE BESYLATE 5 MG PO TABS: 5.0000 mg | ORAL_TABLET | Freq: Every day | ORAL | Status: DC

## 2016-01-18 NOTE — Telephone Encounter (Signed)
Reviewed Dr. Claris Gladden advice with patient who verbalized understanding and agreement to start amlodipine 5 mg once daily.  She will keep a BP log and bring to her appointment on 7/12.  She thanked me for the quick response.

## 2016-01-18 NOTE — Telephone Encounter (Signed)
Needs additional BP control.  Does not appear that I every started her on amlodipine, would start 5 mg daily amlodipine now. Call with BP reads in 2 wks.

## 2016-01-18 NOTE — Telephone Encounter (Signed)
Spoke with patient who states she has been asked for the past couple of months if she feels okay because her face and chest are flushed.  A nurse friend advised her to check her BP and elevated measurements are listed below.  Patient has PPM and she reports pulse has been 60 bpm consistently.  She had an episode of gout 1 week ago - treated with toradol injection and steroid injection followed by 15 day course of oral prednisone. She is aware that prednisone and pain can elevate BP but states flushing occurred prior to taking it.  She has an appointment with Dr. Aundra Dubin on 7/12 but would like to know if he wants her to start an additional BP med prior to that visit.  I advised her that he is working in the hospital today and message will be forwarded to him for advice.  I advised we will call back with his response.  She verbalized understanding and agreement.

## 2016-01-18 NOTE — Telephone Encounter (Signed)
New Message  Pt c/o flushness/redness in face. Pt stated her BP is high  Pt c/o BP issue: STAT if pt c/o blurred vision, one-sided weakness or slurred speech  1. What are your last 5 BP readings? 158/81, 162/78, 170/88   2. Are you having any other symptoms (ex. Dizziness, headache, blurred vision, passed out)? Occasional headache, tingling/flushness/redness in face.   3. What is your BP issue? BP high

## 2016-02-01 ENCOUNTER — Encounter: Payer: Self-pay | Admitting: Cardiology

## 2016-02-01 ENCOUNTER — Ambulatory Visit (INDEPENDENT_AMBULATORY_CARE_PROVIDER_SITE_OTHER): Payer: Medicare Other | Admitting: Cardiology

## 2016-02-01 VITALS — BP 118/62 | HR 74 | Ht 63.0 in | Wt 122.0 lb

## 2016-02-01 DIAGNOSIS — I25110 Atherosclerotic heart disease of native coronary artery with unstable angina pectoris: Secondary | ICD-10-CM | POA: Diagnosis not present

## 2016-02-01 DIAGNOSIS — Z7901 Long term (current) use of anticoagulants: Secondary | ICD-10-CM

## 2016-02-01 DIAGNOSIS — N183 Chronic kidney disease, stage 3 unspecified: Secondary | ICD-10-CM

## 2016-02-01 DIAGNOSIS — I480 Paroxysmal atrial fibrillation: Secondary | ICD-10-CM

## 2016-02-01 DIAGNOSIS — I25810 Atherosclerosis of coronary artery bypass graft(s) without angina pectoris: Secondary | ICD-10-CM | POA: Diagnosis not present

## 2016-02-01 DIAGNOSIS — I350 Nonrheumatic aortic (valve) stenosis: Secondary | ICD-10-CM

## 2016-02-01 LAB — CBC WITH DIFFERENTIAL/PLATELET
Basophils Absolute: 0 cells/uL (ref 0–200)
Basophils Relative: 0 %
EOS PCT: 1 %
Eosinophils Absolute: 125 cells/uL (ref 15–500)
HEMATOCRIT: 40.2 % (ref 35.0–45.0)
HEMOGLOBIN: 13.5 g/dL (ref 11.7–15.5)
LYMPHS ABS: 1250 {cells}/uL (ref 850–3900)
Lymphocytes Relative: 10 %
MCH: 30.9 pg (ref 27.0–33.0)
MCHC: 33.6 g/dL (ref 32.0–36.0)
MCV: 92 fL (ref 80.0–100.0)
MPV: 11.2 fL (ref 7.5–12.5)
Monocytes Absolute: 625 cells/uL (ref 200–950)
Monocytes Relative: 5 %
Neutro Abs: 10500 cells/uL — ABNORMAL HIGH (ref 1500–7800)
Neutrophils Relative %: 84 %
Platelets: 127 10*3/uL — ABNORMAL LOW (ref 140–400)
RBC: 4.37 MIL/uL (ref 3.80–5.10)
RDW: 13.7 % (ref 11.0–15.0)
WBC: 12.5 10*3/uL — AB (ref 3.8–10.8)

## 2016-02-01 NOTE — Patient Instructions (Signed)
Medication Instructions:  Your physician recommends that you continue on your current medications as directed. Please refer to the Current Medication list given to you today.   Labwork: BMET/CBCd today  Testing/Procedures: none  Follow-Up: Your physician recommends that you schedule a follow-up appointment in: 3 months with [name].        If you need a refill on your cardiac medications before your next appointment, please call your pharmacy.

## 2016-02-02 LAB — BASIC METABOLIC PANEL
BUN: 32 mg/dL — AB (ref 7–25)
CALCIUM: 9.2 mg/dL (ref 8.6–10.4)
CO2: 24 mmol/L (ref 20–31)
Chloride: 102 mmol/L (ref 98–110)
Creat: 1.22 mg/dL — ABNORMAL HIGH (ref 0.50–0.99)
GLUCOSE: 190 mg/dL — AB (ref 65–99)
POTASSIUM: 3.6 mmol/L (ref 3.5–5.3)
Sodium: 140 mmol/L (ref 135–146)

## 2016-02-02 NOTE — Progress Notes (Signed)
Patient ID: [name] [name], female   DOB: 1951/04/14, 65 y.o.   MRN: [id number] PCP: Dr. Hilma Favors Oncologist: Dr Marko Plume  65 yo with history of CAD s/p CABG in 12/05 and redo in 1/11, bioprosthetic AVR, diastolic CHF, and sick sinus syndrome s/p PCM presents for cardiology followup.  She had initial CABG with SVG-RCA in 12/05 followed by redo SVG-RCA and bioprosthetic AVR in 1/11.  She has had diastolic CHF and is on Lasix.  In 9/14, she had a CPX test.  This showed severe functional limitation with ischemic ECG changes (inferolateral ST depression and ST elevation in V1 and V2). She had chest pain.  I took her for [facility] in 9/14.  This showed elevated left and right heart filling pressures, patent SVG-PDA, and 80% distal LM/80% ostial LAD stenosis.  Patient was deemed not a candidate for redo CABG (had 2 prior sternotomies as well as chest radiation for Hodgkins lymphoma).  She had DES from left main into proximal LAD and will need long-term DAPT => Brilinta was used as she has been a poor responder to Plavix in the past.  She re-developed angina in 11/15 and was admitted with unstable angina.  LHC showed 80% ostial LM and patent SVG-RCA. She had DES to ostial LM. Last echo in 11/15 showed EF 55-60%, normal bioprosthetic aortic valve, mild-moderate MR, moderate TR.    She has chronic iron deficiency anemia thought to be due to a slow GI bleed.  Last EGD was unremarkable and a capsule endoscopy was also unremarkable.  She has had periodic transfusions; However, recently hemoglobin has been stable.  No BRBPR.   She had bilateral mastectomy for DCIS in 92/11 without complication.  She has an insulin pump followed by an endocrinologist at [facility].   Given myalgias with statins, she was started on Praluent.  LDL is now excellent.    She has short runs of atrial fibrillation.  No prolonged episodes (beyond an hour or two) that she has felt.  She is not anticoagulated.   Since last appointment, she had  diarrhea x 2 months.  Finally was diagnosed with C difficile and was treated.  She was recently started on amlodipine for HTN and BP is improved.  She has the same pattern of exertional dyspnea that she has had long-term (dyspnea with stairs, inclines). No tachypalpitations. She has very slight chest tightness if she walks up a steep hill.  She has been evaluated for Watchman device.   Labs (7/14): hemoglobin 11 Labs (9/14): K 3.9, creatinine 1.48, hemoglobin 7.3 Labs (10/14): hemoglobin 7.7 Labs (11/14): K 3.8, creatinine 1.5 Labs (12/14): HCT 37.7 Labs (1/15): LDL 106, HDL 41, K 3.9, creatinine 1.6, LFTs normal Labs (2/15): K 3.7, creatinine 1.1, BNP 71 Labs (3/15): HCT 36.7 Labs (4/15): K 3.7, creatinine 1.1, HCT 35.5 Labs (7/15): HCT 36.4 Labs (11/15): HCT 32.1, K 4.2, creatinine 1.06 Labs (12/15): hgb 12 Labs (1/16): HCT 37.1 Labs (3/16): LDL 25, HDL 42, elevated transaminases Labs (6/16): HCT 39.6, plts 146 Labs (7/16): LDL 59, HDL 41, AST 34, ALT 49, AP 125 Labs (11/16): HCT 38.8, alkaline phosphatase 156, AST/ALT normal, K 4, creatinine 1.0 Labs (3/17): K 4.4, creatinine 1.4, LFTs normal, hgb 12.5 Labs (5/17): HCT 41.8, K 4, creatinine 1.3  PMH: 1. Sick sinus syndrome s/p [employer] PCM.  2. HTN 3. H/o SBO 4. Renal cell carcinoma s/p right nephrectomy in 2008.  5. 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  Fibroscan in 11/14 showed advanced fibrosis concerning for  cirrhosis. EGD in 11/14 showed no varices.  6. Diastolic CHF: Echo (1/61) with EF 60-65%, bioprosthetic aortic valve with mean gradient 15 mmHg, mild MR.  CPX (9/14): peak VO2 10.4, VE/VCO2 43.8, inferolateral ST depression and ST elevation in V1/V2 with severe dyspnea and chest pain, severe functional limitation with ischemic changes.  Echo (9/14) with EF 60-65%, normal RV size and systolic function, mild-moderate MR, bioprosthetic aortic valve normal, PA systolic pressure 51 mmHg, no evidence for pericardial constriction  though exam incomplete for constriction.  Echo (11/15) with EF 55-60%, bioprosthetic aortic valve, mild-moderate MR, moderate TR, PA systolic pressure 42 mmHg.  - TEE (5/17) with EF 60-65%, normal bioprosthetic aortic valve with mean gradient 13 mmHg, normal RV size and systolic function. 7. TAH-BSO 8. Type II diabetes: Has insulin pump.  9. H/o ischemic colitis. 10. H/o PUD. 11. Diabetic gastroparesis. 12. Hyperlipidemia: Myalgias with Crestor and atorvastatin, elevated LFTs with simvastatin.  Myalgias with > 10 mg pravastatin.  13. CKD 14. Bicuspid aortic valve s/p 19 mm Edwards pericardial valve in 1/11 (had post-operative Dresslers syndrome).   15. CAD: CABG with SVG-RCA in 12/05.  Redo SVG-RCA in 1/11 with AVR.  LHC/RHC (9/14) with mean RA 18, PA 62/25 mean 43, mean PCWP 26, CI 2.8, 70-80% distal LM stenosis, 80% ostial LAD stenosis, total occlusion RCA, SVG-PDA patent.  Patient had DES LM into LAD.  Plan to continue long-term ASA/Brilinta (poor responder to Plavix).  ETT-cardiolite (4/15) with 6' exercise, EF 60%, ST depression in recovery and mild chest pain, no ischemia or infarction by perfusion images.  Unstable angina 11/15 with 80% LM, patent SVG-RCA => DES to ostial LM.   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  17. Anemia: Iron-deficiency.  Suspect chronic GI blood loss.  EGD in 9/14 was unremarkable.  Capsule endoscopy 10/14 was unremarkable.  Has required periodic blood transfusions.  18. Atrial fibrillation: Paroxysmal, noted by [facility] interrogation.  19. Carotid stenosis: Carotid dopplers (2/15) with 40-59% bilateral stenosis. Carotid dopplers (3/16) with 40-59% bilateral ICA stenosis. Carotid dopplers (3/17) with 40-59% bilateral ICA stenosis.  20. 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  22. C difficile colitis  2017  SH: Married, lives in [place], nonsmoker.   FH: CAD  ROS: All systems reviewed and negative except as per HPI.   Current Outpatient Prescriptions  Medication Sig Dispense Refill  . acyclovir cream (ZOVIRAX) 5 % Reported on 12/05/2015    . Alirocumab 75 MG/ML SOPN Inject 75 mg into the skin every 14 (fourteen) days. 2 pen 11  . ALPRAZolam (XANAX) 0.5 MG tablet Take 0.5 mg by mouth at bedtime. May take additional dose daily for anxiety    . amLODipine (NORVASC) 5 MG tablet Take 1 tablet (5 mg total) by mouth daily. 30 tablet 11  . APIDRA 100 UNIT/ML injection INFUSE THROUGH INSULIN PUMP UTD. Has been receiving 82-125 units daily over last 10 days.  Asked pt to bring with her on admission 10/25  2  . aspirin EC 81 MG tablet Take 81 mg by mouth at bedtime.    Marland Kitchen azithromycin (ZITHROMAX) 500 MG tablet 1 tablet by mouth one hour prior to dental appointment/procedure 2 tablet 0  . Calcium Carbonate-Vitamin D (RA CALCIUM PLUS VITAMIN D) 600-400 MG-UNIT per tablet Take 1 tablet by mouth daily.     . Cholecalciferol 2000 UNITS TABS Take 1 tablet by  mouth daily.    . clobetasol (TEMOVATE) 0.05 % external solution Apply 1 application topically daily as needed (for hives). Reported on 12/05/2015    . desoximetasone (TOPICORT) 0.05 % cream Apply 1 application topically 2 (two) times daily as needed. Reported on 12/05/2015    . diclofenac sodium (VOLTAREN) 1 % GEL Apply topically as needed (for body aches). Reported on 12/05/2015    . docusate sodium (COLACE) 100 MG capsule Take 100 mg by mouth 2 (two) times daily. Reported on 12/05/2015    . Fe Fum-FA-B Cmp-C-Zn-Mg-Mn-Cu (HEMATINIC PLUS COMPLEX) 106-1 MG TABS Take 1 tablet daily with with a Vitamin C tablet 90 each 3  . furosemide (LASIX) 80 MG tablet TAKE 1 TABLET BY MOUTH DAILY 90 tablet 1  . glucagon (GLUCAGON EMERGENCY) 1 MG injection Inject 1 mg into the vein once as needed (for severe reaction). Reported on 12/05/2015    . hydrocortisone  (ANUSOL-HC) 25 MG suppository Place 25 mg rectally 2 (two) times daily.    . isosorbide mononitrate (IMDUR) 60 MG 24 hr tablet TAKE 1 TABLET BY MOUTH ONCE DAILY 90 tablet 2  . Magnesium Oxide (MAG-OXIDE PO) Take 250 mg by mouth daily.    . metoprolol (LOPRESSOR) 50 MG tablet Take 1.5 tablets (75 mg total) by mouth 2 (two) times daily. 270 tablet 6  . Multiple Vitamins-Minerals (MULTIVITAMIN WITH MINERALS) tablet Take 1 tablet by mouth daily.      . nitroGLYCERIN (NITROLINGUAL) 0.4 MG/SPRAY spray Place 1 spray under the tongue every 5 (five) minutes as needed for chest pain. 12 g 1  . omeprazole (PRILOSEC) 20 MG capsule Take 20 mg by mouth daily.    . ONE TOUCH ULTRA TEST test strip 1 each by Other route as needed for other.     . potassium chloride SA (K-DUR,KLOR-CON) 20 MEQ tablet Take 40 mEq by mouth every morning.    . pravastatin (PRAVACHOL) 10 MG tablet TAKE 1 TABLET BY MOUTH ONCE DAILY 90 tablet 1  . ticagrelor (BRILINTA) 60 MG TABS tablet Take 60 mg by mouth 2 (two) times daily.    . valACYclovir (VALTREX) 1000 MG tablet Take 1,000 mg by mouth 3 (three) times daily. Reported on 12/05/2015    . vitamin C (ASCORBIC ACID) 250 MG tablet Take 250 mg by mouth 2 (two) times daily.     No current facility-administered medications for this visit.    BP 118/62 mmHg  Pulse 74  Ht 5' 3"  (1.6 m)  Wt 122 lb (55.339 kg)  BMI 21.62 kg/m2 General: NAD Neck: JVP 7 cm, no thyromegaly or thyroid nodule.  Lungs: Clear to auscultation bilaterally with normal respiratory effort. CV: Nondisplaced PMI.  Heart regular S1/S2, no S3/S4, 2/6 early systolic murmur RUSB.  No peripheral edema.  Left carotid bruit.  2+ PT pulses bilaterally.  Abdomen: Soft, nontender, no hepatosplenomegaly, no distention.  Skin: Intact without lesions or rashes.  Neurologic: Alert and oriented x 3.  Psych: Normal affect. Extremities: No clubbing or cyanosis.   Assessment/Plan: 1. CAD: s/p SVG-RCA in 12/05, then redo SVG-RCA in  1/11.  She was found to have distal left main/proximal LAD severe stenosis in 9/14.  She was very symptomatic.  PCI with DES to distal LM/proximal LAD was done rather than bypass as she would have been a poor candidate for redo sternotomy (2 prior sternotomies and history of chest wall radiation for Hodgkins disease).  Recurrent unstable angina with 80% ostial LM treated with DES in 11/15.  No  chest pain since that time.  She will need to be on Brilinta and ASA (poor responder to plavix) long-term.  This has been complicated by suspected slow GI bleed. Hemoglobin stable recently.  - Brilinta now decreased to 60 mg bid.  - Continue ASA 81, statin, metoprolol, Imdur without changes.    2. Bioprosthetic aortic valve replacement: Stable on recent TEE.  3. Diastolic CHF: Euvolemic on exam, EF normal on 11/15 echo. Continue current Lasix. 4. CKD:  Patient has a single kidney s/p right nephrectomy.  She sees nephrology. Creatinine stable. 5. Hyperlipidemia: She has not been able to tolerate any higher potency statin than pravastatin 10 mg daily.  She is now on Praluent with excellent LDL.     6. Anemia: Suspected slow GI bleed.  Unremarkable EGD and capsule endoscopy in fall 2014.  Unfortunately, needs to stay on Brilinta long-term (Plavix non-responder).  Will need to follow closely (sees hematology), plan for transfusion with hemoglobin < 8 as this allows best control of her symptoms. Recently, hemoglobin has been more stable.   7. Atrial fibrillation: Paroxysmal, brief episodes noted on PCM interrogation.  Given GI bleeding and need to be on both ASA 81 and Brilinta, risks likely outweigh benefits of anticoagulation.   - She tends to have short atrial fibrillation episodes.  She is not a good candidate for Ic agents.  She is not a good Multaq candidate with CHF.  If she needs an antiarrhythmic, consider Tikosyn or Sotalol (though long 1st degree AVB makes these less appealing).  - She is going to have a  Watchman device placed.  I will talk with Dr Rayann Heman about anticoagulation post-Watchman.  Long-term, she will be on ASA 81 and ticagrelor 60 mg bid.  Post-Watchman, she will need to be on warfarin for several weeks.  Will have to hold ticagrelor during that period.   8. Carotid bruit: Mild to moderate bilateral stenosis.  Repeat in 3/18. 9. SSS: Pacemaker in place.  10. HTN: BP controlled on amlodipine.    [name] 02/02/2016

## 2016-02-07 ENCOUNTER — Telehealth: Payer: Self-pay | Admitting: Internal Medicine

## 2016-02-07 ENCOUNTER — Ambulatory Visit (INDEPENDENT_AMBULATORY_CARE_PROVIDER_SITE_OTHER): Payer: Medicare Other | Admitting: *Deleted

## 2016-02-07 DIAGNOSIS — I495 Sick sinus syndrome: Secondary | ICD-10-CM

## 2016-02-07 NOTE — Telephone Encounter (Signed)
New Message  Pt call requesting to speak with RN about labs she was to complete. Pt states she had labs done with Dr. Aundra Dubin and states those results were posted on her my chart account. Pt wants to know if she would need to get the extra labs complete. Pt also has more questions about her future procedure with Dr. Rayann Heman. Please call back to discuss

## 2016-02-07 NOTE — Progress Notes (Signed)
Remote pacemaker transmission.   

## 2016-02-07 NOTE — Telephone Encounter (Signed)
She had labs on 7/12 with CHF clinic and does not want to repeat if she doesn't have to.  I will forward to Amber but I let her know that if she needs to repeat we will get them at the hospital

## 2016-02-09 ENCOUNTER — Encounter: Payer: Self-pay | Admitting: Cardiology

## 2016-02-09 NOTE — Telephone Encounter (Signed)
LM TO CALL BACK ./CY 

## 2016-02-09 NOTE — Telephone Encounter (Signed)
We will repeat needed labs at hospital.  Discussed with [name] and [place] for 5 days prior to Watchman (last dose 7/22).  Please call patient and let her know.  I will also call her next week to answer any other questions when I am back in office.   Chanetta Marshall, NP 02/09/2016 12:55 PM

## 2016-02-10 NOTE — Telephone Encounter (Signed)
Pt advised to hold Brilinta for 5 days prior to [place], she would like Amber to call her next Tuesday if possible.  I will forward to Amber for follow up with patient next week.

## 2016-02-13 ENCOUNTER — Telehealth: Payer: Self-pay | Admitting: Cardiology

## 2016-02-13 ENCOUNTER — Telehealth: Payer: Self-pay | Admitting: Pharmacist

## 2016-02-13 MED ORDER — ALIROCUMAB 150 MG/ML ~~LOC~~ SOPN: 1.0000 "pen " | PEN_INJECTOR | SUBCUTANEOUS | 11 refills | Status: DC

## 2016-02-13 NOTE — Telephone Encounter (Signed)
New message      The pt has questions about the procedure she is having, the is DM she is allergic to the 2 insulin they use at the hospital and the pt has to bring in her own insulin, and the instruction on how often to check her sugar.  The pt has a insulin pump, she wants to go over the directions again over everything. The pt will be put under for her procedure and she wants everyone aware.  The pt wants to know if she needs to come off her Aspirin.  The pt has had a re-check on lab her white count was up does the pt need it re-checked again.

## 2016-02-13 NOTE — Telephone Encounter (Signed)
F/u  Pt returning call- Please call back and discuss.

## 2016-02-13 NOTE — Telephone Encounter (Signed)
Returned pt's call. She states she had her cholesterol checked at [facility] last week. LDL has been trending up since starting Praluent - most recently up to 97. LDL was in the 30s when checked by [name] 3 months ago. Reports no issues with injections other than some bruising. Will increase dose to Praluent 141m/mL dose and recheck lipid panel 2 months after starting injections.

## 2016-02-13 NOTE — Telephone Encounter (Signed)
New MEssage  Pt requested to speak w/ [name] [name] concerning her Praluent injections- and her lab reports. Please call back and discuss.

## 2016-02-13 NOTE — Telephone Encounter (Signed)
Spoke with patient.  Instructions reviewed.  Ok to stay on ASA.  Pt instructed to bring her own insulin with her.  Her endocrinologist has outlined plan for management during procedure and she will bring those instructions to the hospital.  She is allergic to certain insulins. I have advised her to bring her own from home   Pt had lipids rechecked, cholesterol elevated again despite Praluent - discussed with CVRR clinic with plans to increase dose.  Pt would like message forwarded to [name] - will send today.   Chanetta Marshall, NP 02/13/2016 4:44 PM

## 2016-02-13 NOTE — Telephone Encounter (Signed)
Left message for patient to call  Please route to me when she calls back

## 2016-02-15 ENCOUNTER — Encounter (HOSPITAL_COMMUNITY): Payer: Self-pay | Admitting: Certified Registered Nurse Anesthetist

## 2016-02-15 ENCOUNTER — Telehealth: Payer: Self-pay | Admitting: Cardiology

## 2016-02-15 NOTE — Telephone Encounter (Signed)
Left message for patient to call back  

## 2016-02-15 NOTE — Telephone Encounter (Signed)
Spoke with patient who states she received a call from [facility] HF nurse that her weight is increased 3 lbs since last weigh in (states she did not weigh herself yesterday, last weight Sun 7/23).  Weight today 122 lb up from 119 lb Sunday morning.  She takes 80 mg furosemide daily.  She states she is having some bilateral lower extremity and abdominal swelling over the last few days.  States she is helping her daughter pack boxes to move and is on her feet from 8 am to 11 pm except during meal times.   Notices that when she squats down that legs feel tight Denies SOB, chest tightness She is scheduled for Watchman tomorrow and would like to know whether to take furosemide and kdur tomorrow morning and should she take any additional today.  She states she will be leaving home at 0500 in the morning to arrive at [facility] at 0600 for procedure.

## 2016-02-15 NOTE — Telephone Encounter (Signed)
New message   Pt c/o swelling: STAT is pt has developed SOB within 24 hours  1. How long have you been experiencing swelling? 2 weeks off and on   2. Where is the swelling located? Stomach knees and ankles   3.  Are you currently taking a "fluid pill"? yes  4.  Are you currently SOB? no  5.  Have you traveled recently? no

## 2016-02-15 NOTE — Telephone Encounter (Signed)
Would take Lasix and K+ as normal today.  None in the morning. Will assess fluid status when she arrives in the morning and give as needed tomorrow.   Chanetta Marshall, NP 02/15/2016 12:11 PM

## 2016-02-15 NOTE — Telephone Encounter (Signed)
Plan of care to take prescribed dose of lasix and K+ today and to hold tomorrow morning reviewed with patient who verbalized understanding and thanked me for the call.

## 2016-02-15 NOTE — Anesthesia Preprocedure Evaluation (Addendum)
Anesthesia Evaluation  Patient identified by MRN, date of birth, ID band Patient awake    Reviewed: Allergy & Precautions, NPO status , Patient's Chart, lab work & pertinent test results  History of Anesthesia Complications (+) PONVNegative for: history of anesthetic complications  Airway Mallampati: II  TM Distance: >3 FB     Dental  (+) Teeth Intact, Dental Advisory Given   Pulmonary    breath sounds clear to auscultation       Cardiovascular hypertension, Pt. on medications and Pt. on home beta blockers + angina + CAD, + Cardiac Stents, + CABG, + Peripheral Vascular Disease and +CHF  + dysrhythmias Atrial Fibrillation + pacemaker + Valvular Problems/Murmurs MR  Rhythm:Regular Rate:Normal  CABG 2005, 2011 AVR 2011    EF 55-60%   Neuro/Psych  Headaches, PSYCHIATRIC DISORDERS Anxiety    GI/Hepatic PUD, GERD  Medicated and Controlled,(+) Hepatitis -  Endo/Other  diabetes, Well Controlled, Type 1, Insulin Dependent  Renal/GU Renal InsufficiencyRenal diseaseNephrectomy 2008, creat 1.2     Musculoskeletal  (+) Arthritis ,   Abdominal   Peds  Hematology  (+) anemia ,   Anesthesia Other Findings   Reproductive/Obstetrics                          BP Readings from Last 3 Encounters:  02/01/16 118/62  12/07/15 138/63  12/05/15 (!) 130/59   Lab Results  Component Value Date   WBC 12.5 (H) 02/01/2016   HGB 13.5 02/01/2016   HCT 40.2 02/01/2016   MCV 92.0 02/01/2016   PLT 127 (L) 02/01/2016     Chemistry      Component Value Date/Time   NA 140 02/01/2016 1503   NA 142 12/05/2015 1236   K 3.6 02/01/2016 1503   K 4.0 12/05/2015 1236   CL 102 02/01/2016 1503   CO2 24 02/01/2016 1503   CO2 31 (H) 12/05/2015 1236   BUN 32 (H) 02/01/2016 1503   BUN 25.6 12/05/2015 1236   CREATININE 1.22 (H) 02/01/2016 1503   CREATININE 1.3 (H) 12/05/2015 1236      Component Value Date/Time   CALCIUM 9.2  02/01/2016 1503   CALCIUM 9.9 12/05/2015 1236   ALKPHOS 91 12/05/2015 1236   AST 29 12/05/2015 1236   ALT 35 12/05/2015 1236   BILITOT 0.48 12/05/2015 1236     Lab Results  Component Value Date   INR 1.18 06/07/2014   INR 1.21 06/06/2014   INR 1.10 06/06/2014   Lab Results  Component Value Date   HGBA1C 6.9 (H) 05/10/2015   EKG 11/21/15 Sinus Rhythm with 1st degree AV block LAE LVH  Anesthesia Physical Anesthesia Plan  ASA: III  Anesthesia Plan: General   Post-op Pain Management:    Induction: Intravenous  Airway Management Planned: Oral ETT  Additional Equipment: Arterial line  Intra-op Plan:   Post-operative Plan: Extubation in OR and Possible Post-op intubation/ventilation  Informed Consent: I have reviewed the patients History and Physical, chart, labs and discussed the procedure including the risks, benefits and alternatives for the proposed anesthesia with the patient or authorized representative who has indicated his/her understanding and acceptance.   Dental advisory given  Plan Discussed with: CRNA and Anesthesiologist  Anesthesia Plan Comments:        Anesthesia Quick Evaluation

## 2016-02-16 ENCOUNTER — Ambulatory Visit (HOSPITAL_COMMUNITY)
Admission: RE | Admit: 2016-02-16 | Discharge: 2016-02-16 | Disposition: A | Discharge status: Home / Self Care | Payer: Medicare Other | Source: Ambulatory Visit | Attending: Internal Medicine | Admitting: Internal Medicine

## 2016-02-16 ENCOUNTER — Encounter (HOSPITAL_COMMUNITY): Payer: Self-pay | Admitting: Internal Medicine

## 2016-02-16 ENCOUNTER — Ambulatory Visit (HOSPITAL_COMMUNITY): Payer: Medicare Other | Admitting: Certified Registered Nurse Anesthetist

## 2016-02-16 ENCOUNTER — Encounter (HOSPITAL_COMMUNITY)
Admission: RE | Disposition: A | Discharge status: Home / Self Care | Payer: Self-pay | Source: Ambulatory Visit | Attending: Internal Medicine

## 2016-02-16 ENCOUNTER — Ambulatory Visit (HOSPITAL_BASED_OUTPATIENT_CLINIC_OR_DEPARTMENT_OTHER): Payer: Medicare Other

## 2016-02-16 DIAGNOSIS — E1143 Type 2 diabetes mellitus with diabetic autonomic (poly)neuropathy: Secondary | ICD-10-CM | POA: Insufficient documentation

## 2016-02-16 DIAGNOSIS — D509 Iron deficiency anemia, unspecified: Secondary | ICD-10-CM | POA: Insufficient documentation

## 2016-02-16 DIAGNOSIS — I4891 Unspecified atrial fibrillation: Secondary | ICD-10-CM

## 2016-02-16 DIAGNOSIS — Z8719 Personal history of other diseases of the digestive system: Secondary | ICD-10-CM | POA: Insufficient documentation

## 2016-02-16 DIAGNOSIS — I5032 Chronic diastolic (congestive) heart failure: Secondary | ICD-10-CM | POA: Diagnosis not present

## 2016-02-16 DIAGNOSIS — G43909 Migraine, unspecified, not intractable, without status migrainosus: Secondary | ICD-10-CM | POA: Insufficient documentation

## 2016-02-16 DIAGNOSIS — M1990 Unspecified osteoarthritis, unspecified site: Secondary | ICD-10-CM | POA: Diagnosis not present

## 2016-02-16 DIAGNOSIS — K7581 Nonalcoholic steatohepatitis (NASH): Secondary | ICD-10-CM | POA: Diagnosis not present

## 2016-02-16 DIAGNOSIS — Z8571 Personal history of Hodgkin lymphoma: Secondary | ICD-10-CM | POA: Insufficient documentation

## 2016-02-16 DIAGNOSIS — Z8249 Family history of ischemic heart disease and other diseases of the circulatory system: Secondary | ICD-10-CM | POA: Insufficient documentation

## 2016-02-16 DIAGNOSIS — F419 Anxiety disorder, unspecified: Secondary | ICD-10-CM | POA: Insufficient documentation

## 2016-02-16 DIAGNOSIS — I480 Paroxysmal atrial fibrillation: Secondary | ICD-10-CM | POA: Insufficient documentation

## 2016-02-16 DIAGNOSIS — Z951 Presence of aortocoronary bypass graft: Secondary | ICD-10-CM | POA: Insufficient documentation

## 2016-02-16 DIAGNOSIS — K3184 Gastroparesis: Secondary | ICD-10-CM | POA: Diagnosis not present

## 2016-02-16 DIAGNOSIS — N182 Chronic kidney disease, stage 2 (mild): Secondary | ICD-10-CM | POA: Insufficient documentation

## 2016-02-16 DIAGNOSIS — I2510 Atherosclerotic heart disease of native coronary artery without angina pectoris: Secondary | ICD-10-CM | POA: Diagnosis not present

## 2016-02-16 DIAGNOSIS — E785 Hyperlipidemia, unspecified: Secondary | ICD-10-CM | POA: Diagnosis not present

## 2016-02-16 DIAGNOSIS — E1122 Type 2 diabetes mellitus with diabetic chronic kidney disease: Secondary | ICD-10-CM | POA: Insufficient documentation

## 2016-02-16 DIAGNOSIS — Z8041 Family history of malignant neoplasm of ovary: Secondary | ICD-10-CM | POA: Insufficient documentation

## 2016-02-16 DIAGNOSIS — Z803 Family history of malignant neoplasm of breast: Secondary | ICD-10-CM | POA: Insufficient documentation

## 2016-02-16 DIAGNOSIS — I130 Hypertensive heart and chronic kidney disease with heart failure and stage 1 through stage 4 chronic kidney disease, or unspecified chronic kidney disease: Secondary | ICD-10-CM | POA: Insufficient documentation

## 2016-02-16 DIAGNOSIS — Z7901 Long term (current) use of anticoagulants: Secondary | ICD-10-CM | POA: Insufficient documentation

## 2016-02-16 DIAGNOSIS — M810 Age-related osteoporosis without current pathological fracture: Secondary | ICD-10-CM | POA: Insufficient documentation

## 2016-02-16 DIAGNOSIS — Z953 Presence of xenogenic heart valve: Secondary | ICD-10-CM | POA: Diagnosis not present

## 2016-02-16 DIAGNOSIS — Z801 Family history of malignant neoplasm of trachea, bronchus and lung: Secondary | ICD-10-CM | POA: Insufficient documentation

## 2016-02-16 DIAGNOSIS — Z833 Family history of diabetes mellitus: Secondary | ICD-10-CM | POA: Insufficient documentation

## 2016-02-16 DIAGNOSIS — Z539 Procedure and treatment not carried out, unspecified reason: Secondary | ICD-10-CM | POA: Insufficient documentation

## 2016-02-16 DIAGNOSIS — Z950 Presence of cardiac pacemaker: Secondary | ICD-10-CM | POA: Diagnosis present

## 2016-02-16 HISTORY — PX: LEFT ATRIAL APPENDAGE OCCLUSION: SHX173A

## 2016-02-16 LAB — TYPE AND SCREEN
ABO/RH(D): O POS
ANTIBODY SCREEN: NEGATIVE

## 2016-02-16 LAB — GLUCOSE, CAPILLARY
GLUCOSE-CAPILLARY: 141 mg/dL — AB (ref 65–99)
GLUCOSE-CAPILLARY: 155 mg/dL — AB (ref 65–99)

## 2016-02-16 LAB — ECHO TEE
AO mean calculated velocity dopler: 164 cm/s
AV Mean grad: 13 mmHg
AV pk vel: 242 cm/s
AVPG: 23 mmHg
VTI: 58.8 cm

## 2016-02-16 SURGERY — LEFT ATRIAL APPENDAGE OCCLUSION
Anesthesia: General

## 2016-02-16 MED ORDER — SODIUM CHLORIDE 0.9% FLUSH: 3.0000 mL | INTRAVENOUS | Status: DC | PRN

## 2016-02-16 MED ORDER — MIDAZOLAM HCL 5 MG/5ML IJ SOLN
INTRAMUSCULAR | Status: DC | PRN
  Administered 2016-02-16: 2 mg via INTRAVENOUS

## 2016-02-16 MED ORDER — SODIUM CHLORIDE 0.9 % IV SOLN
INTRAVENOUS | Status: DC | PRN
  Administered 2016-02-16 (×2): via INTRAVENOUS

## 2016-02-16 MED ORDER — SUGAMMADEX SODIUM 200 MG/2ML IV SOLN
INTRAVENOUS | Status: DC | PRN
  Administered 2016-02-16: 200 mg via INTRAVENOUS

## 2016-02-16 MED ORDER — SODIUM CHLORIDE 0.45 % IV SOLN: INTRAVENOUS | Status: DC

## 2016-02-16 MED ORDER — PHENYLEPHRINE HCL 10 MG/ML IJ SOLN
INTRAVENOUS | Status: DC | PRN
  Administered 2016-02-16: 20 ug/min via INTRAVENOUS

## 2016-02-16 MED ORDER — VANCOMYCIN HCL IN DEXTROSE 1-5 GM/200ML-% IV SOLN
1000.0000 mg | INTRAVENOUS | Status: DC
  Filled 2016-02-16: qty 200

## 2016-02-16 MED ORDER — HEPARIN (PORCINE) IN NACL 2-0.9 UNIT/ML-% IJ SOLN
INTRAMUSCULAR | Status: AC
  Filled 2016-02-16: qty 500

## 2016-02-16 MED ORDER — FENTANYL CITRATE (PF) 250 MCG/5ML IJ SOLN
INTRAMUSCULAR | Status: DC | PRN
  Administered 2016-02-16: 75 ug via INTRAVENOUS
  Administered 2016-02-16: 50 ug via INTRAVENOUS

## 2016-02-16 MED ORDER — SODIUM CHLORIDE 0.9% FLUSH: 3.0000 mL | Freq: Two times a day (BID) | INTRAVENOUS | Status: DC

## 2016-02-16 MED ORDER — SODIUM CHLORIDE 0.9 % IV SOLN: 250.0000 mL | INTRAVENOUS | Status: DC | PRN

## 2016-02-16 MED ORDER — ONDANSETRON HCL 4 MG/2ML IJ SOLN
4.0000 mg | Freq: Four times a day (QID) | INTRAMUSCULAR | Status: DC | PRN
Start: 2016-02-16 — End: 2016-02-16

## 2016-02-16 MED ORDER — HEPARIN SODIUM (PORCINE) 1000 UNIT/ML IJ SOLN
INTRAMUSCULAR | Status: AC
  Filled 2016-02-16: qty 1

## 2016-02-16 MED ORDER — PHENYLEPHRINE HCL 10 MG/ML IJ SOLN
INTRAMUSCULAR | Status: DC | PRN
  Administered 2016-02-16: 120 ug via INTRAVENOUS
  Administered 2016-02-16: 180 ug via INTRAVENOUS

## 2016-02-16 MED ORDER — ROCURONIUM BROMIDE 100 MG/10ML IV SOLN
INTRAVENOUS | Status: DC | PRN
  Administered 2016-02-16: 35 mg via INTRAVENOUS

## 2016-02-16 MED ORDER — ONDANSETRON HCL 4 MG/2ML IJ SOLN
INTRAMUSCULAR | Status: DC | PRN
  Administered 2016-02-16: 4 mg via INTRAVENOUS

## 2016-02-16 MED ORDER — PROPOFOL 10 MG/ML IV BOLUS
INTRAVENOUS | Status: DC | PRN
  Administered 2016-02-16: 100 mg via INTRAVENOUS

## 2016-02-16 MED ORDER — LIDOCAINE 2% (20 MG/ML) 5 ML SYRINGE
INTRAMUSCULAR | Status: DC | PRN
  Administered 2016-02-16: 40 mg via INTRAVENOUS

## 2016-02-16 SURGICAL SUPPLY — 7 items
BLANKET WARM UNDERBOD FULL ACC (MISCELLANEOUS) IMPLANT
KIT HEART LEFT (KITS) IMPLANT
PACK CARDIAC CATHETERIZATION (CUSTOM PROCEDURE TRAY) IMPLANT
SHEATH PINNACLE 8F 10CM (SHEATH) IMPLANT
TRANSDUCER W/STOPCOCK (MISCELLANEOUS) IMPLANT
TUBING CIL FLEX 10 FLL-RA (TUBING) IMPLANT
WIRE AMPLATZ WHISKJ .035X260CM (WIRE) IMPLANT

## 2016-02-16 NOTE — Transfer of Care (Signed)
Immediate Anesthesia Transfer of Care Note  Patient: [name] [name]  Procedure(s) Performed: Procedure(s): LEFT ATRIAL APPENDAGE OCCLUSION (N/A)  Patient Location: PACU  Anesthesia Type:General  Level of Consciousness: awake, alert , oriented and patient cooperative  Airway & Oxygen Therapy: Patient Spontanous Breathing and Patient connected to face mask oxygen  Post-op Assessment: Report given to RN, Post -op Vital signs reviewed and stable and Patient moving all extremities X 4  Post vital signs: Reviewed and stable  Last Vitals:  Vitals:   02/16/16 0555  BP: (!) 145/70  Pulse: 74  Resp: 20  Temp: 36.4 C    Last Pain:  Vitals:   02/16/16 0555  TempSrc: Oral      Patients Stated Pain Goal: 3 (86/28/24 1753)  Complications: No apparent anesthesia complications

## 2016-02-16 NOTE — Anesthesia Postprocedure Evaluation (Signed)
Anesthesia Post Note  Patient: [name] [name]  Procedure(s) Performed: Procedure(s) (LRB): LEFT ATRIAL APPENDAGE OCCLUSION (N/A)  Patient location during evaluation: Cath Lab Anesthesia Type: General Level of consciousness: awake, awake and alert and oriented Pain management: pain level controlled Vital Signs Assessment: post-procedure vital signs reviewed and stable Respiratory status: spontaneous breathing, nonlabored ventilation and respiratory function stable Cardiovascular status: blood pressure returned to baseline Anesthetic complications: no    Last Vitals:  Vitals:   02/16/16 1023 02/16/16 1038  BP: 133/62 (!) 129/41  Pulse: 77 77  Resp:    Temp:      Last Pain:  Vitals:   02/16/16 1017  TempSrc: Oral  PainSc: 2                  Arth Nicastro COKER

## 2016-02-16 NOTE — Discharge Instructions (Signed)
Transesophageal Echocardiogram Transesophageal echocardiography (TEE) is a picture test of your heart using sound waves. The pictures taken can give very detailed pictures of your heart. This can help your doctor see if there are problems with your heart. TEE can check:  If your heart has blood clots in it.  How well your heart valves are working.  If you have an infection on the inside of your heart.  Some of the major arteries of your heart.  If your heart valve is working after a Office manager.  Your heart before a procedure that uses a shock to your heart to get the rhythm back to normal. BEFORE THE PROCEDURE  Do not eat or drink for 6 hours before the procedure or as told by your doctor.  Make plans to have someone drive you home after the procedure. Do not drive yourself home.  An IV tube will be put in your arm. PROCEDURE  You will be given a medicine to help you relax (sedative). It will be given through the IV tube.  A numbing medicine will be sprayed or gargled in the back of your throat to help numb it.  The tip of the probe is placed into the back of your mouth. You will be asked to swallow. This helps to pass the probe into your esophagus.  Once the tip of the probe is in the right place, your doctor can take pictures of your heart.  You may feel pressure at the back of your throat. AFTER THE PROCEDURE  You will be taken to a recovery area so the sedative can wear off.  Your throat may be sore and scratchy. This will go away slowly over time.  You will go home when you are fully awake and able to swallow liquids.  You should have someone stay with you for the next 24 hours.  Do not drive or operate machinery for the next 24 hours.   This information is not intended to replace advice given to you by your health care provider. Make sure you discuss any questions you have with your health care provider.   Document Released: 05/06/2009 Document Revised: 07/14/2013  Document Reviewed: 01/08/2013 Elsevier Interactive Patient Education 2016 [place] Refer to this sheet in the next few weeks. These instructions provide you with information about caring for yourself after your procedure. Your health care provider may also give you more specific instructions. Your treatment has been planned according to current medical practices, but problems sometimes occur. Call your health care provider if you have any problems or questions after your procedure. WHAT TO EXPECT AFTER THE PROCEDURE After your procedure, it is typical to have the following:  Bruising at the radial site that usually fades within 1-2 weeks.  Blood collecting in the tissue (hematoma) that may be painful to the touch. It should usually decrease in size and tenderness within 1-2 weeks. HOME CARE INSTRUCTIONS  Take medicines only as directed by your health care provider.  You may shower 24-48 hours after the procedure or as directed by your health care provider. Remove the bandage (dressing) and gently wash the site with plain soap and water. Pat the area dry with a clean towel. Do not rub the site, because this may cause bleeding.  Do not take baths, swim, or use a hot tub until your health care provider approves.  Check your insertion site every day for redness, swelling, or drainage.  Do not apply powder or lotion to the  site.  Do not flex or bend the affected arm for 24 hours or as directed by your health care provider.  Do not push or pull heavy objects with the affected arm for 24 hours or as directed by your health care provider.  Do not lift over 10 lb (4.5 kg) for 5 days after your procedure or as directed by your health care provider.  Ask your health care provider when it is okay to:  Return to work or school.  Resume usual physical activities or sports.  Resume sexual activity.  Do not drive home if you are discharged the same day as the procedure.  Have someone else drive you.  You may drive 24 hours after the procedure unless otherwise instructed by your health care provider.  Do not operate machinery or power tools for 24 hours after the procedure.  If your procedure was done as an outpatient procedure, which means that you went home the same day as your procedure, a responsible adult should be with you for the first 24 hours after you arrive home.  Keep all follow-up visits as directed by your health care provider. This is important. SEEK MEDICAL CARE IF:  You have a fever.  You have chills.  You have increased bleeding from the radial site. Hold pressure on the site. SEEK IMMEDIATE MEDICAL CARE IF:  You have unusual pain at the radial site.  You have redness, warmth, or swelling at the radial site.  You have drainage (other than a small amount of blood on the dressing) from the radial site.  The radial site is bleeding, and the bleeding does not stop after 30 minutes of holding steady pressure on the site.  Your arm or hand becomes pale, cool, tingly, or numb.   This information is not intended to replace advice given to you by your health care provider. Make sure you discuss any questions you have with your health care provider.   Document Released: 08/11/2010 Document Revised: 07/30/2014 Document Reviewed: 01/25/2014 Elsevier Interactive Patient Education [employer].

## 2016-02-16 NOTE — Anesthesia Procedure Notes (Signed)
Procedure Name: Intubation Date/Time: 02/16/2016 8:03 AM Performed by: Mervyn Gay Pre-anesthesia Checklist: Patient identified, Patient being monitored, Timeout performed, Emergency Drugs available and Suction available Patient Re-evaluated:Patient Re-evaluated prior to inductionOxygen Delivery Method: Circle System Utilized Preoxygenation: Pre-oxygenation with 100% oxygen Intubation Type: IV induction Ventilation: Mask ventilation without difficulty Laryngoscope Size: Miller and 3 Grade View: Grade I Tube type: Oral Tube size: 7.5 mm Number of attempts: 1 Airway Equipment and Method: Stylet Placement Confirmation: ETT inserted through vocal cords under direct vision,  positive ETCO2 and breath sounds checked- equal and bilateral Secured at: 21 cm Tube secured with: Tape Dental Injury: Teeth and Oropharynx as per pre-operative assessment

## 2016-02-16 NOTE — Progress Notes (Signed)
  Echocardiogram Echocardiogram Transesophageal has been performed.  Darlina Sicilian M 02/16/2016, 9:18 AM

## 2016-02-16 NOTE — H&P (Signed)
PCP:  Purvis Kilts, MD                      Cardiologist:  Aundra Dubin Primary Electrophysiologist: Lovena Le Referring Physician: Aundra Dubin                       CC: to discuss Watchman implant    History of Present Illness: [name] [name] is a 65 y.o. female who presents today for of left atrial appendage occluder device implantation.  She has paroxysmal atrial fibrillation as well as GI bleeding requiring transfusions in the past.  The patient has been evaluated by their referring physician and is felt to be a poor candidate for long term [place] due to GI bleeding and need for long term DAPT.  She therefore presents today for Watchman placement.  Discussion with [name] has been had by myself and the patient.  He agrees with the procedure.      Today, she denies symptoms of palpitations, chest pain, shortness of breath, orthopnea, PND, lower extremity edema, claudication, dizziness, presyncope, syncope, bleeding, or neurologic sequela.  + mild swelling over the past week.  The patient is tolerating medications without difficulties and is otherwise without complaint today.         Past Medical History  Diagnosis Date  . Aortic stenosis     a. bicuspid aortic valve; mean gradient of 20 mmHg in 2/10; b. s/p bioprosth AVR (19-mm Edwards pericardial valve) with a redo coronary artery bypass graft procedure in 07/2009; postoperative Dressler's syndrome    . Coronary artery disease     a. initial CABG with SVG-RCA in 12/05. b. redo SVG-RCA and bioprosthetic AVR in 1/11. d. 9/14 - PCI with DES to distal LM/proximal LAD was done rather than bypass as she would have been a poor candidate for redo sternotomy. d. S/p DES to prox LM 05/2014.  Marland Kitchen Chronic diastolic CHF (congestive heart failure) ([place])     a. 9.2014 EchoP EF 60-65%, no rwma, bioprosth AVR, mean gradient of 64mHg, mild to mod MR, PASP 53mg.  . Marland Kitchenyperlipidemia   . Hypertension   . Small bowel obstruction (HCC)      Recurrent; resolved after resection of a lipoma  . Osteopenia      hip on DEXA in October 2007.  . Marland Kitchenarcinoma, renal cell ([facility]11/2008    Laparoscopic right nephrectomy  . NASH (nonalcoholic steatohepatitis) 1999    -biopsy in 1999  . Diabetes mellitus 03/2010     TYPE II: Hemoglobin A1c of 7.4 in  03/2010; 8.4 in 06/2010; treated with insulin pump  . Migraines   . Duodenal ulcer     Remote; H. pylori positive  . Colitis, ischemic ([facility]2008  . Hodgkin's disease ([facility]1991    Mantle radiation therapy  . Iron deficiency anemia     a. 03/2013 EGD: essentially normal. Possible slow GIB.  . Marland Kitchenastroparesis due to DM ([facility]CP[facility]10/04/2012  . Hyperplastic gastric polyp 06/23/2012  . Arthritis   . Osteoporosis   . Anxiety   . Syncope     a. H/o recurrent syncope with pauses on loop recorder s/p Medtronic pacemaker 07/2012.  . CKD (chronic kidney disease), stage II   . PAF (paroxysmal atrial fibrillation) ([facility]    a.  h/o brief episodes noted on ppm interrogation. b. given GI bleeding and need to be on both ASA 81 and Brilinta, risks likely outweigh benefits of anticoagulation.  Past Surgical History  Procedure Laterality Date  . Total abdominal hysterectomy w/ bilateral salpingoophorectomy  2000  . Breast excisional biopsy      benign, bilateral  . Nephrectomy  2008    Laparoscopic right nephrectomy, renal cell carcinoma  . Coronary artery bypass graft  2005, 2011    CABG-most recent SVG to RCA-12/05; RCA occluded with patent graft in 2006; Redo bypass in 07/2009  . Tumor excision  1981    removal of Hodgkins lymphoma  . Exploratory laparotomy w/ bowel resection      Small bowel resection of lipoma  . Aortic valve replacement  2011    Aortic valve replacement surgery with a 19 mm bioprosthetic device post redo CABG January 2011.  . Colonoscopy      Multiple with adenomatous polyps  . Cervical biopsy    . Liver biopsy    .  Bone marrow biopsy    . Esophagogastroduodenoscopy    . Esophagogastroduodenoscopy  06/23/2012    Procedure: ESOPHAGOGASTRODUODENOSCOPY (EGD);  Surgeon: Gatha Mayer, MD;  Location: [facility];  Service: Endoscopy;  Laterality: N/A;  . Colonoscopy  06/23/2012    Procedure: COLONOSCOPY;  Surgeon: Gatha Mayer, MD;  Location: [facility] ENDOSCOPY;  Service: Endoscopy;  Laterality: N/A;  [name] dilation  06/23/2012    Procedure: [name] DILATION;  Surgeon: Gatha Mayer, MD;  Location: [facility] ENDOSCOPY;  Service: Endoscopy;;  54 fr  . Pacemaker insertion  08/21/2012    Medtronic Adapta L dual-chamber pacemaker  . Esophagogastroduodenoscopy N/A 04/06/2013    Procedure: ESOPHAGOGASTRODUODENOSCOPY (EGD);  Surgeon: Irene Shipper, MD;  Location: [facility] ENDOSCOPY;  Service: Endoscopy;  Laterality: N/A;  . Givens capsule study N/A 04/30/2013    Procedure: GIVENS CAPSULE STUDY;  Surgeon: Gatha Mayer, MD;  Location: [place];  Service: Endoscopy;  Laterality: N/A;  . Loop recorder implant N/A 12/10/2011    Procedure: LOOP RECORDER IMPLANT;  Surgeon: Evans Lance, MD;  Location: [facility] CATH LAB;  Service: Cardiovascular;  Laterality: N/A;  . Permanent pacemaker insertion N/A 08/21/2012    Procedure: PERMANENT PACEMAKER INSERTION;  Surgeon: Evans Lance, MD;  Location[facility] CATH LAB;  Service: Cardiovascular;  Laterality: N/A;  . Left and right heart catheterization with coronary angiogram N/A 04/07/2013    Procedure: LEFT AND RIGHT HEART CATHETERIZATION WITH CORONARY ANGIOGRAM;  Surgeon: Larey Dresser, MD;  Location: [facility] CATH LAB;  Service: Cardiovascular;  Laterality: N/A;  . Percutaneous coronary stent intervention (pci-s) N/A 04/09/2013    Procedure: PERCUTANEOUS CORONARY STENT INTERVENTION (PCI-S);  Surgeon: Burnell Blanks, MD;  Location: [facility] CATH LAB;  Service: Cardiovascular;  Laterality: N/A;  . Left heart catheterization with coronary/graft angiogram N/A 06/07/2014     Procedure: LEFT HEART CATHETERIZATION WITH Beatrix Fetters;  Surgeon: Burnell Blanks, MD;  Location: [facility] CATH LAB;  Service: Cardiovascular;  Laterality: N/A;  . Mastectomy Left 05/17/2015    total   . Total mastectomy Left 05/17/2015    Procedure: LEFT TOTAL MASTECTOMY;  Surgeon: Rolm Bookbinder, MD;  Location: [place];  Service: General;  Laterality: Left;    medicine list reviewed today  Allergies:   Cephalexin; Clindamycin hcl; Clindamycin/lincomycin; Humalog; Novolog; Penicillins; Tizanidine; Crestor; Gentamycin; Limonene; Atorvastatin; Avandia; Cholestyramine; Codeine; Metformin; Nickel; Plavix; Pravastatin; Ranitidine; Simvastatin; Tamiflu; Tramadol; Erythromycin; Iodinated diagnostic agents; Latex; Neosporin; and Neomycin-bacitracin-polymyxin    Social History:  The patient  reports that she has never smoked. She has never used smokeless tobacco. She reports that she does not drink alcohol  or use illicit drugs.   Family History:  The patient's family history includes Breast cancer in her sister and sister; Cervical cancer in her sister; Diabetes in her father; Glaucoma in her brother; Heart attack in her father and mother; Heart disease in her father and mother; Hypertension in her brother; Lung cancer in her sister and sister; Ovarian cancer in her sister. There is no history of Colon cancer or Stomach cancer.    ROS:  Please see the history of present illness.   All other systems are reviewed and negative.    PHYSICAL EXAM: Vitals:   02/16/16 0555  BP: (!) 145/70  Pulse: 74  Resp: 20  Temp: 97.5 F (36.4 C)   GEN: Well nourished, well developed, in no acute distress  HEENT: normal  Neck: no JVD, carotid bruits, or masses Cardiac: RRR; no murmurs, rubs, or gallops,no edema  Respiratory:  clear to auscultation bilaterally, normal work of breathing GI: soft, nontender, nondistended, + BS MS: no deformity or atrophy  Skin: warm and dry  Neuro:   Strength and sensation are intact Psych: euthymic mood, full affect  Labs reviewed   Other studies Reviewed: Additional studies/ records that were reviewed today include: pacemaker implant sheet   ASSESSMENT AND PLAN:  1.  Paroxysmal atrial fibrillation [name] [name] is a 65 y.o. female who presents today for Watchman left atrial appendage closure device impantation.  She has a history of paroxysmal atrial fibrillation.  This patients CHA2DS2-VASc Score and unadjusted Ischemic Stroke Rate (% per year) is equal to 7.2 % stroke rate/year from a score of 5 which necessitates long term oral anticoagulation to prevent stroke. HasBled score is 5.  Modified Rankin Score is 0. Unfortunately, she is not felt to be a long term Warfarin candidate secondary to prior significant GI bleeding requiring transfusion and need for lifelong DAPT.  The patients chart has been reviewed and I along with [name] feel that they would be a candidate for short term oral anticoagulation.  Procedural risks for the Watchman implant have been reviewed with the patient including a 1% risk of stroke, 2% risk of perforation, 0.1% risk of device embolization.  Given the patient's poor candidacy for long-term oral anticoagulation, ability to tolerate short term oral anticoagulation, we will proceed with the watchman left atrial appendage closure system.    Thompson Grayer MD, [facility] 02/16/2016 7:55 AM

## 2016-02-17 ENCOUNTER — Telehealth: Payer: Self-pay | Admitting: Internal Medicine

## 2016-02-17 NOTE — Telephone Encounter (Signed)
New Message  Pt calling verbalized she had a procedure yesterday for watchman, pt feels like she's having an allergic reaction from anesthesia due coughing up yellow/orangish mucus with pain in right side of neck, side and stomach. Pt is not having any chest pains or shortness of breath currently. Pt is also allergic to Nickel.

## 2016-02-17 NOTE — Telephone Encounter (Signed)
  Spoke with patient and she has a lot of mucus and her throat is sore.  Let her know that this is normal for being intubated.  She has not had any blood and feels okay otherwise.  She says just under her ribs she feels like she has been doing sit-ups.  Hs no idea what that is from.  She says that Dr Rayann Heman or Amber are to be in touch with her next week with a plan.  I will forward to them for FYI.  She sounds good just mainly calling to inform us.  She will call if anythig changes

## 2016-02-21 LAB — CUP PACEART REMOTE DEVICE CHECK
Battery Remaining Longevity: 149 mo
Battery Voltage: 2.81 V
Brady Statistic AP VP Percent: 0 %
Brady Statistic AS VP Percent: 0 %
Implantable Lead Implant Date: 20140130
Implantable Lead Location: 753859
Implantable Lead Model: 5076
Lead Channel Pacing Threshold Amplitude: 0.75 V
Lead Channel Pacing Threshold Amplitude: 0.875 V
Lead Channel Pacing Threshold Pulse Width: 0.4 ms
Lead Channel Sensing Intrinsic Amplitude: 1 mV
Lead Channel Setting Pacing Amplitude: 1.75 V
Lead Channel Setting Pacing Pulse Width: 0.4 ms
Lead Channel Setting Sensing Sensitivity: 5.6 mV
MDC IDC LEAD IMPLANT DT: 20140130
MDC IDC LEAD LOCATION: 753860
MDC IDC MSMT BATTERY IMPEDANCE: 185 Ohm
MDC IDC MSMT LEADCHNL RA IMPEDANCE VALUE: 454 Ohm
MDC IDC MSMT LEADCHNL RV IMPEDANCE VALUE: 646 Ohm
MDC IDC MSMT LEADCHNL RV PACING THRESHOLD PULSEWIDTH: 0.4 ms
MDC IDC MSMT LEADCHNL RV SENSING INTR AMPL: 16 mV
MDC IDC SESS DTM: 20170718115713
MDC IDC SET LEADCHNL RV PACING AMPLITUDE: 2.5 V
MDC IDC STAT BRADY AP VS PERCENT: 4 %
MDC IDC STAT BRADY AS VS PERCENT: 96 %

## 2016-02-21 NOTE — Telephone Encounter (Signed)
New Message  Pt call requesting to speak with RN for f/u messages. Pt wanted to inform that she has gout. Pt states he is seeing swelling in her right foot. Pt states foot is also blue. Please call back to discuss with pt

## 2016-02-21 NOTE — Telephone Encounter (Signed)
Returned call to patient and she has had another acute flare up of gout.  She has had 3 flares the last 3 months and has called her PCP(couldn't get in) and her kidney MD.  The PCP did call in some colchicine for her yesterday.  Her kidney MD's office has not called her yet.  Says her foot is discolored and she can not put any weight on it. She is on her way to [place] now to see her Endocrinologist.  I have discussed with Chanetta Marshall, NP and she advised the patient foloow up with CHF clinic.  Patient is going to call me after her appointment at [facility]

## 2016-02-23 ENCOUNTER — Telehealth: Payer: Self-pay | Admitting: Internal Medicine

## 2016-02-23 ENCOUNTER — Other Ambulatory Visit: Payer: Self-pay | Admitting: Internal Medicine

## 2016-02-23 NOTE — Telephone Encounter (Signed)
Says Dr Rayann Heman was suppose to be getting back in touch with her about a plan as she is not a Watchman candidate.  She will continue to do as she has been until she hears from someone.  Wanted [name] to know about her recent gout flare and that she will starting Uloric 40 mg daily as soon as the flare is gone.

## 2016-02-23 NOTE — Telephone Encounter (Signed)
Follow-up     The pt states someone called doesn't know who but the pt has been speaking with a nurse believes it was the nurses

## 2016-02-26 NOTE — Telephone Encounter (Signed)
[name],  Do you want to continue [name] [name] on her current plan?  I dont think that she will be a candidate for watchman anytime soon.

## 2016-02-29 NOTE — Telephone Encounter (Signed)
I think we should keep her on the same meds she was on prior to starting Watchman workup.

## 2016-03-05 ENCOUNTER — Telehealth: Payer: Self-pay | Admitting: Cardiology

## 2016-03-05 NOTE — Telephone Encounter (Signed)
Uloric should be ok

## 2016-03-05 NOTE — Telephone Encounter (Signed)
Pt advised okay to use Uloric per [name].

## 2016-03-05 NOTE — Telephone Encounter (Signed)
LMTCB

## 2016-03-05 NOTE — Telephone Encounter (Signed)
Pt states she had liver ultrasound at [facility] last week to follow up on NASH. Pt states Dr Genia Del, her liver specialist at [facility], told her she has a lesion on her left kidney and she should follow up with her urologist/nephrologist. Pt states she has called Dr Lynne Logan office for an appointment since he had done her right nephrectomy in the past. Pt states she was told Dr Alinda Money could not see her until January but his office is working on getting a sooner appt for her.  Pt asked me to forward this information to [name].

## 2016-03-05 NOTE — Telephone Encounter (Signed)
New Message:    Her primary doctor prescribed Uloric 40 mg.When she rerad the insert it said it might cause heart attacks and strokes. She wants to know if [name] thinks this is alright for her to take?

## 2016-03-05 NOTE — Telephone Encounter (Signed)
I will forward to Dr McLean for review 

## 2016-03-22 ENCOUNTER — Encounter: Payer: Self-pay | Admitting: Oncology

## 2016-03-22 ENCOUNTER — Encounter: Payer: Self-pay | Admitting: Cardiology

## 2016-03-22 NOTE — Progress Notes (Signed)
Medical Oncology  Report from [place] dated 03-15-16 for diagnostic right 3D mammogram received and sent to be scanned into this EMR.  No mammographic evidence of malignancy.  Godfrey Pick, MD

## 2016-03-23 ENCOUNTER — Encounter: Payer: Self-pay | Admitting: Internal Medicine

## 2016-03-23 ENCOUNTER — Encounter: Payer: Self-pay | Admitting: Nurse Practitioner

## 2016-03-26 ENCOUNTER — Other Ambulatory Visit: Payer: Self-pay | Admitting: Oncology

## 2016-03-26 DIAGNOSIS — D500 Iron deficiency anemia secondary to blood loss (chronic): Secondary | ICD-10-CM

## 2016-03-26 DIAGNOSIS — D0512 Intraductal carcinoma in situ of left breast: Secondary | ICD-10-CM

## 2016-03-26 DIAGNOSIS — D696 Thrombocytopenia, unspecified: Secondary | ICD-10-CM

## 2016-03-28 ENCOUNTER — Telehealth: Payer: Self-pay | Admitting: Cardiology

## 2016-03-28 NOTE — Telephone Encounter (Signed)
Ok to hold Brilinta prior to endoscopy, continue ASA.

## 2016-03-28 NOTE — Telephone Encounter (Signed)
Clearance sent to Dr. Oswaldo Milian office.

## 2016-03-28 NOTE — Telephone Encounter (Signed)
Request for surgical clearance:  1. What type of surgery is being performed? Upper Endoscopy    2. When is this surgery scheduled? 04/05/16  Are there any medications that need to be held prior to surgery and how long?Arby Barrette ( needs to stop 5 days prior)   3. Name of physician performing surgery? Dr. Chrissie Noa Coch   4. What is your office phone and fax number?ph# [phone] fax# [phone]

## 2016-03-29 ENCOUNTER — Ambulatory Visit (HOSPITAL_BASED_OUTPATIENT_CLINIC_OR_DEPARTMENT_OTHER): Payer: Medicare Other | Admitting: Oncology

## 2016-03-29 ENCOUNTER — Other Ambulatory Visit: Payer: Self-pay | Admitting: Cardiology

## 2016-03-29 ENCOUNTER — Other Ambulatory Visit (HOSPITAL_BASED_OUTPATIENT_CLINIC_OR_DEPARTMENT_OTHER): Payer: Medicare Other

## 2016-03-29 ENCOUNTER — Telehealth: Payer: Self-pay | Admitting: Oncology

## 2016-03-29 VITALS — BP 136/59 | HR 79 | Temp 98.1°F | Resp 18 | Ht 62.75 in | Wt 126.0 lb

## 2016-03-29 DIAGNOSIS — D0512 Intraductal carcinoma in situ of left breast: Secondary | ICD-10-CM

## 2016-03-29 DIAGNOSIS — R922 Inconclusive mammogram: Secondary | ICD-10-CM

## 2016-03-29 DIAGNOSIS — Z923 Personal history of irradiation: Secondary | ICD-10-CM

## 2016-03-29 DIAGNOSIS — D500 Iron deficiency anemia secondary to blood loss (chronic): Secondary | ICD-10-CM

## 2016-03-29 DIAGNOSIS — Z9012 Acquired absence of left breast and nipple: Secondary | ICD-10-CM

## 2016-03-29 DIAGNOSIS — Z7901 Long term (current) use of anticoagulants: Secondary | ICD-10-CM

## 2016-03-29 DIAGNOSIS — Z8571 Personal history of Hodgkin lymphoma: Secondary | ICD-10-CM

## 2016-03-29 DIAGNOSIS — R9230 Dense breasts, unspecified: Secondary | ICD-10-CM

## 2016-03-29 DIAGNOSIS — Z853 Personal history of malignant neoplasm of breast: Secondary | ICD-10-CM | POA: Insufficient documentation

## 2016-03-29 DIAGNOSIS — D696 Thrombocytopenia, unspecified: Secondary | ICD-10-CM

## 2016-03-29 LAB — CBC WITH DIFFERENTIAL/PLATELET
BASO%: 0.9 % (ref 0.0–2.0)
Basophils Absolute: 0.1 10*3/uL (ref 0.0–0.1)
EOS%: 1.4 % (ref 0.0–7.0)
Eosinophils Absolute: 0.1 10*3/uL (ref 0.0–0.5)
HCT: 41 % (ref 34.8–46.6)
HGB: 13.6 g/dL (ref 11.6–15.9)
LYMPH%: 13.6 % — AB (ref 14.0–49.7)
MCH: 30.3 pg (ref 25.1–34.0)
MCHC: 33.2 g/dL (ref 31.5–36.0)
MCV: 91.3 fL (ref 79.5–101.0)
MONO#: 0.6 10*3/uL (ref 0.1–0.9)
MONO%: 5.2 % (ref 0.0–14.0)
NEUT#: 8.5 10*3/uL — ABNORMAL HIGH (ref 1.5–6.5)
NEUT%: 78.9 % — AB (ref 38.4–76.8)
PLATELETS: 133 10*3/uL — AB (ref 145–400)
RBC: 4.49 10*6/uL (ref 3.70–5.45)
RDW: 13.6 % (ref 11.2–14.5)
WBC: 10.7 10*3/uL — AB (ref 3.9–10.3)
lymph#: 1.5 10*3/uL (ref 0.9–3.3)

## 2016-03-29 LAB — COMPREHENSIVE METABOLIC PANEL
ALT: 27 U/L (ref 0–55)
ANION GAP: 11 meq/L (ref 3–11)
AST: 23 U/L (ref 5–34)
Albumin: 3.8 g/dL (ref 3.5–5.0)
Alkaline Phosphatase: 144 U/L (ref 40–150)
BUN: 21 mg/dL (ref 7.0–26.0)
CHLORIDE: 105 meq/L (ref 98–109)
CO2: 28 meq/L (ref 22–29)
CREATININE: 1.1 mg/dL (ref 0.6–1.1)
Calcium: 9.9 mg/dL (ref 8.4–10.4)
EGFR: 56 mL/min/{1.73_m2} — AB (ref >90)
GLUCOSE: 170 mg/dL — AB (ref 70–140)
Potassium: 4 mEq/L (ref 3.5–5.1)
SODIUM: 143 meq/L (ref 136–145)
Total Bilirubin: 0.43 mg/dL (ref 0.20–1.20)
Total Protein: 7.5 g/dL (ref 6.4–8.3)

## 2016-03-29 NOTE — Telephone Encounter (Signed)
appt made and avs printed °

## 2016-03-30 ENCOUNTER — Telehealth: Payer: Self-pay | Admitting: Cardiology

## 2016-03-30 DIAGNOSIS — E785 Hyperlipidemia, unspecified: Secondary | ICD-10-CM

## 2016-03-30 NOTE — Telephone Encounter (Signed)
New Message  Pt c/o medication issue:  1. Name of [place]   2. How are you currently taking this medication (dosage and times per day)? 150 mg ml sopn injection every 14 days  3. Are you having a reaction (difficulty breathing--STAT)? Pt verbalized she is a wet dish rag, having leg cramps and a runny nose.  4. What is your medication issue? Increase dosage, side affects, and lipid panel pull.

## 2016-03-30 NOTE — Telephone Encounter (Signed)
Pt reports that she took her first injection of the 123m Praluent dose about a week ago. She experienced similar sx as when she first started on the 722mdose. Reports some blood blisters in her mouth, overall malaise, and bad leg cramps for a few days that have since resolved. When these symptoms occurred previously, she continued with Praluent and sx resolved. She has since been on PCSK9i therapy for 2 years. She reports her symptoms are tolerable and she would like her cholesterol checked on the higher dose. Scheduled labwork for the end of September after she has had 3 injections of Praluent 15037mPt will call if symptoms worsen.

## 2016-03-31 ENCOUNTER — Encounter: Payer: Self-pay | Admitting: Oncology

## 2016-03-31 NOTE — Progress Notes (Signed)
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 She had mantle radiation for Hodgkins in 1980s. She had renal cell carcinoma post right nephrectomy 2008.  The hypothyroidism, breast cancer and likely CAD are related to previous mantle radiation.   She had right diagnostic tomo mammogram at [facility] 03-15-16, with no mammographic findings of concern. She also had [name] of soft tissue beneath left mastectomy at [facility] 10-18-15, no findings of concern. [name] sees her every 6 months. She has not been aware of any changes in right breast. The soft tissue fullness below left mastectomy scar makes bras uncomfortable, but seems less disturbing to her than previously. NOTE she has pacemaker so cannot have breast MRI  She had CT evaluation by Dr Dutch Gray for right renal cyst, which patient understands appears benign. She has prn follow up with Dr Alinda Money. She denies hematuria or flank pain.  She was to have left atrial appendage occlusion device placed by Dr Thompson Grayer, however the device contained nickel alloy which could not be used due to her significant nickel allergy. She continues close follow up with cardiology and remains on Brilinta and ASA long term for stent and atrial fibrillation.   She is on treatment for gout by Dr Lorrene Reid.  She may have upper endoscopy and colonoscopy by Dr Derrill Kay at [facility] later this month, however holding anticoagulation has not been addressed and she is following up with that office.  Blood sugars have been more stable recently. She has had no infectious illnesses over the  summer including no viral infections. She has been more fatigued since increase in Praluent last week, as she recalls when she began that medication. She denies increased SOB, any bleeding or unusual bleeding.  Bowels mostly regular. No new cardiac symptoms. Remainder of 10 point Review of Systems negative.    Her daughter is now Mudlogger of admissions at [facility]. Oldest granddaughter to get driving permit.   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 She has radiation induced hypothyroidism and the treatment caused or worsened coronary artery disease. She has had no further active Hodgkins.   Renal Cell Carcinoma: She had right nephrectomy for renal cell carcinoma 2008: T1bNx chromophobe variant Fuhrman Nuclear Grade III-IV with negative margins, no renal vein invasion, no extension thru capsule at right nephrectomy 06-11-2007 by [name], path [id number]. She had CT follow up of right renal cyst by Dr Alinda Money ~ 01-2016.  DCIS left breast identified on screening tomo mammograms done at [facility] 03-14-15, with heterogeneously dense breast tissue, heterogeneous calcification left breast at 3:00 posterior and otherwise no findings of concern. She had left diagnostic mammogram at [facility] 03-16-15 with suspicious grouped calcifications identified. She had tomo stereotactic needle core biopsy by [name] at [facility] on 03-21-14, seven cores submitted, pathology DCIS with necrosis, grade 2, ER PR negative [facility] [phone]). She had [name] left axilla at [facility] 03-29-15 with no evidence of malignancy. After input from general surgery, radiation oncology, cardiology and medical oncology, she had left total mastectomy by [name] on 05-17-15. Pathology  (  [id number]) multiple foci grade 3 DCIS largest area 1 cm, without necrosis, closest margin anterior  72m   Variable mild thrombocytopenia since ~ 06-2013, possibly medication related. Spleen size normal by [place]2017. Note on Brilinta and ASA medically necessary for cardiac indications.   Objective:  Vital signs in last 24 hours:  BP (!) 136/59 (BP Location: Left Arm, Patient Position: Sitting)   Pulse 79   Temp 98.1 F (36.7 C) (Oral)   Resp 18   Ht 5' 2.75" (1.594 m)   Wt 126 lb (57.2 kg)   SpO2 100%   BMI 22.50 kg/m  Weight up 4 lbs Alert, oriented and appropriate. Ambulatory without assistance   HEENT:PERRL, sclerae not icteric. Oral mucosa moist without lesions, posterior pharynx clear.  Neck supple. No thyroid mass.  No JVD.  Lymphatics:no cervical,supraclavicular, axillary or inguinal adenopathy Resp: clear to auscultation bilaterally and normal percussion bilaterally Cardio: regular RR now, clear heart sounds. Pacemaker.  GI: soft, nontender, not distended, no mass or organomegaly. A few bowel sounds. Insulin pump in place. Musculoskeletal/ Extremities: UE/ LE without pitting edema, cords, tenderness Neuro: no peripheral neuropathy. Otherwise nonfocal Skin without rash, ecchymosis, petechiae Right breast without dominant mass, skin or nipple findings. Left mastectomy scar without findings of concern. Soft tissue beneath mastectomy scar not remarkable. Axillae benign.   Lab Results:  Results for orders placed or performed in visit on 03/29/16  CBC with Differential  Result Value Ref Range   WBC 10.7 (H) 3.9 - 10.3 10e3/uL   NEUT# 8.5 (H) 1.5 - 6.5 10e3/uL   HGB 13.6 11.6 - 15.9 g/dL   HCT 41.0 34.8 - 46.6 %   Platelets 133 (L) 145 - 400 10e3/uL   MCV 91.3 79.5 - 101.0 fL   MCH 30.3 25.1 - 34.0 pg   MCHC 33.2 31.5 - 36.0 g/dL   RBC 4.49 3.70 - 5.45 10e6/uL   RDW 13.6 11.2 - 14.5 %   lymph# 1.5 0.9 - 3.3 10e3/uL   MONO# 0.6 0.1 - 0.9 10e3/uL   Eosinophils Absolute 0.1 0.0 - 0.5 10e3/uL   Basophils Absolute 0.1 0.0 - 0.1 10e3/uL   NEUT% 78.9 (H) 38.4 - 76.8 %    LYMPH% 13.6 (L) 14.0 - 49.7 %   MONO% 5.2 0.0 - 14.0 %   EOS% 1.4 0.0 - 7.0 %   BASO% 0.9 0.0 - 2.0 %  Comprehensive metabolic panel  Result Value Ref Range   Sodium 143 136 - 145 mEq/L   Potassium 4.0 3.5 - 5.1 mEq/L   Chloride 105 98 - 109 mEq/L   CO2 28 22 - 29 mEq/L   Glucose 170 (H) 70 - 140 mg/dl   BUN 21.0 7.0 - 26.0 mg/dL   Creatinine 1.1 0.6 - 1.1 mg/dL   Total Bilirubin 0.43 0.20 - 1.20 mg/dL   Alkaline Phosphatase 144 40 - 150 U/L   AST 23 5 - 34 U/L   ALT 27 0 - 55 U/L   Total Protein 7.5 6.4 - 8.3 g/dL   Albumin 3.8 3.5 - 5.0 g/dL   Calcium 9.9 8.4 - 10.4 mg/dL   Anion Gap 11 3 - 11 mEq/L   EGFR 56 (L) >90 ml/min/1.73 m2   *Note: Due to a large number of results and/or encounters for the requested time period, some results have not been displayed. A complete set of results can be found in Results Review.    Iron studies 09-2015 serum iron 93 and 38% sat  Studies/Results:  Report of right tomo diagnostic mammogram done at [facility] 03-15-16 to be scanned into this EMR  Medications: I have reviewed the patient's current medications.  DISCUSSION No obvious findings of concern from multiple recent and past oncologic conditions. Needs follow up every 4-6 months for the DCIS at least for next year, could alternate with [name]. Will need careful long term follow up of right breast, as at high risk there also due to the mantle radiation. NOTE decision not to do prophylactic right mastectomy was due to her other multiple serious comorbid conditions. Hemoglobin today is very good and platelets better today/ in adequate range for necessary anticoagulation    Assessment/Plan: 1.DCIS left breast: found on screening mammogram, ER PR negative. Post left total mastectomy 05-17-15, now on observation.  As this DCIS was likely caused result of remote mantle radiation, she is at risk also in remaining right breast. Up to date on right mammogram at [facility] 03-15-16  2.variable,  mild thrombocytopenia since ~ 06-2013, possibly medication related. No bleeding even on brilinta and ASA which are medically necessary due to cardiac disease. Spleen size normal by [name] 10-26-15. Follow 3.previous anemia related to intermittent slow GI bleeding, last iron studies good 09-2015, not anemic now. Colonoscopy 06-2012 no polyps, thought to have had ischemic colitis 2014. NOTE needs lasix if PRBCs. 4. CAD s/p CABG in 12/05 and redo in 1/11, bioprosthetic AVR, diastolic CHF, and sick sinus syndrome s/p PCM. DES from left main into proximal LAD 03-2013; re-developed angina in 11/15 with DES to ostial LM. Last echo 11/15 EF 55-60%, normal bioprosthetic aortic valve, mild-moderate MR, moderate TR.On Brilinta and ASA as poor responder to plavix previously. Paroxysmal A fib evaluated at ED 05-28-15. Followed closely by [name]. Could not have the atrial appendage occlusion as device contained nickel 5.IIA nodular sclerosing Hodgkins treated with mantle radiation in 1980s, not recurrent. Radiation likely pertinent to hypothyroidism, CAD and breast cancer. 6.type II diabetes on insulin pump: followed by Dr Frederik Pear at [facility], on insulin pump. Diabetic gastroparesis. 7.Carotid stenosis: Carotid dopplers (2/15) with 40-59% bilateral stenosis. Carotid dopplers (3/16) with 40-59% bilateral ICA stenosis.  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 EGD 05-2013 no varices. No splenomegaly by [name] 10-2015 9..post right nephrectomy for renal cell ca 2008. She is followed yearly by Dr Lorrene Reid of nephrology and had recent follow up with CT by Dr Alinda Money 10. Hypothyroidism on replacement, also followed by Dr Frederik Pear. 11.osteopenia by bone density scan [facility] 11-23-14 12.post TAH BSO 13.C diff and campylobacter GI infections spring 2-17 14.gout, on treatment   All questions answered. Time spent 25 min including >50% counseling and coordination of care. I will see her back in late Dec. Route PCP , cc Dr  Donne Hazel    Evlyn Clines, MD

## 2016-04-10 ENCOUNTER — Other Ambulatory Visit: Payer: Self-pay | Admitting: Cardiology

## 2016-04-20 ENCOUNTER — Other Ambulatory Visit: Payer: Medicare Other

## 2016-04-27 ENCOUNTER — Other Ambulatory Visit (INDEPENDENT_AMBULATORY_CARE_PROVIDER_SITE_OTHER): Payer: Medicare Other | Admitting: *Deleted

## 2016-04-27 DIAGNOSIS — E785 Hyperlipidemia, unspecified: Secondary | ICD-10-CM | POA: Diagnosis not present

## 2016-04-27 LAB — LIPID PANEL
CHOLESTEROL: 125 mg/dL (ref 125–200)
HDL: 43 mg/dL — AB (ref >=46)
LDL Cholesterol: 42 mg/dL (ref <130)
TRIGLYCERIDES: 199 mg/dL — AB (ref <150)
Total CHOL/HDL Ratio: 2.9 Ratio (ref <=5.0)
VLDL: 40 mg/dL — ABNORMAL HIGH (ref <30)

## 2016-05-03 ENCOUNTER — Telehealth: Payer: Self-pay

## 2016-05-03 NOTE — Telephone Encounter (Signed)
Pt called to r/s her 12/28 appt to after Jan 1. inbasket sent.

## 2016-05-04 ENCOUNTER — Encounter: Payer: Self-pay | Admitting: Cardiology

## 2016-05-04 ENCOUNTER — Ambulatory Visit (INDEPENDENT_AMBULATORY_CARE_PROVIDER_SITE_OTHER): Payer: Medicare Other | Admitting: Cardiology

## 2016-05-04 VITALS — BP 122/58 | HR 82 | Ht 62.75 in | Wt 123.1 lb

## 2016-05-04 DIAGNOSIS — I10 Essential (primary) hypertension: Secondary | ICD-10-CM | POA: Diagnosis not present

## 2016-05-04 DIAGNOSIS — I6523 Occlusion and stenosis of bilateral carotid arteries: Secondary | ICD-10-CM | POA: Diagnosis not present

## 2016-05-04 DIAGNOSIS — I5032 Chronic diastolic (congestive) heart failure: Secondary | ICD-10-CM

## 2016-05-04 DIAGNOSIS — E7800 Pure hypercholesterolemia, unspecified: Secondary | ICD-10-CM

## 2016-05-04 DIAGNOSIS — I480 Paroxysmal atrial fibrillation: Secondary | ICD-10-CM | POA: Diagnosis not present

## 2016-05-04 DIAGNOSIS — I25110 Atherosclerotic heart disease of native coronary artery with unstable angina pectoris: Secondary | ICD-10-CM

## 2016-05-04 DIAGNOSIS — I350 Nonrheumatic aortic (valve) stenosis: Secondary | ICD-10-CM

## 2016-05-04 DIAGNOSIS — Z79899 Other long term (current) drug therapy: Secondary | ICD-10-CM | POA: Diagnosis not present

## 2016-05-04 LAB — BASIC METABOLIC PANEL
BUN: 25 mg/dL (ref 7–25)
CALCIUM: 10.1 mg/dL (ref 8.6–10.4)
CHLORIDE: 99 mmol/L (ref 98–110)
CO2: 28 mmol/L (ref 20–31)
Creat: 1.02 mg/dL — ABNORMAL HIGH (ref 0.50–0.99)
Glucose, Bld: 178 mg/dL — ABNORMAL HIGH (ref 65–99)
Potassium: 3.6 mmol/L (ref 3.5–5.3)
SODIUM: 141 mmol/L (ref 135–146)

## 2016-05-04 LAB — MAGNESIUM: Magnesium: 2 mg/dL (ref 1.5–2.5)

## 2016-05-04 MED ORDER — ALIROCUMAB 150 MG/ML ~~LOC~~ SOPN: 1.0000 "pen " | PEN_INJECTOR | SUBCUTANEOUS | 3 refills | Status: DC

## 2016-05-04 NOTE — Patient Instructions (Signed)
Medication Instructions:  The current medical regimen is effective;  continue present plan and medications.  Labwork: Please have blood work today (BMP,Mg)  Testing/Procedures: Your physician has requested that you have a carotid duplex in 09/2016. This test is an ultrasound of the carotid arteries in your neck. It looks at blood flow through these arteries that supply the brain with blood. Allow one hour for this exam. There are no restrictions or special instructions.  Follow-Up: Follow up in 4 months with [name].  If you need a refill on your cardiac medications before your next appointment, please call your pharmacy.  Thank you for choosing [facility]!!

## 2016-05-06 NOTE — Progress Notes (Signed)
Patient ID: [name] [name], female   DOB: January 07, 1951, 65 y.o.   MRN: [id number] PCP: Dr. Hilma Favors Oncologist: Dr Marko Plume  65 yo with history of CAD s/p CABG in 12/05 and redo in 1/11, bioprosthetic AVR, diastolic CHF, and sick sinus syndrome s/p PCM presents for cardiology followup.  She had initial CABG with SVG-RCA in 12/05 followed by redo SVG-RCA and bioprosthetic AVR in 1/11.  She has had diastolic CHF and is on Lasix.  In 9/14, she had a CPX test.  This showed severe functional limitation with ischemic ECG changes (inferolateral ST depression and ST elevation in V1 and V2). She had chest pain.  I took her for [facility] in 9/14.  This showed elevated left and right heart filling pressures, patent SVG-PDA, and 80% distal LM/80% ostial LAD stenosis.  Patient was deemed not a candidate for redo CABG (had 2 prior sternotomies as well as chest radiation for Hodgkins lymphoma).  She had DES from left main into proximal LAD and will need long-term DAPT => Brilinta was used as she has been a poor responder to Plavix in the past.  She re-developed angina in 11/15 and was admitted with unstable angina.  LHC showed 80% ostial LM and patent SVG-RCA. She had DES to ostial LM. Last echo in 11/15 showed EF 55-60%, normal bioprosthetic aortic valve, mild-moderate MR, moderate TR.    She has chronic iron deficiency anemia thought to be due to a slow GI bleed.  Last EGD was unremarkable and a capsule endoscopy was also unremarkable.  She has had periodic transfusions; However, recently hemoglobin has been stable.  No BRBPR.   She had bilateral mastectomy for DCIS in 20/35 without complication.  She has an insulin pump followed by an endocrinologist at [facility].   Given myalgias with statins, she was started on Praluent.  LDL is now excellent.    She has short runs of atrial fibrillation.  No prolonged episodes (beyond an hour or two) that she has felt.  She is not anticoagulated.  We had planned for Watchman placement, but  given her nickel allergy, we were unable to place a Watchman.   Generally doing well.  Feels occasional palpitations.  She is short of breath with heavier housework or when she tries to jog. No dyspnea walking on flat ground.  No orthopnea/PND.   No melena/BRBPR.  She has had some edema and abdominal distention from time to time, takes an extra Lasix 40 mg in the afternoon on these days. Weight is up 1 lb. No chest pain.  She has occasional leg cramps.  Labs (7/14): hemoglobin 11 Labs (9/14): K 3.9, creatinine 1.48, hemoglobin 7.3 Labs (10/14): hemoglobin 7.7 Labs (11/14): K 3.8, creatinine 1.5 Labs (12/14): HCT 37.7 Labs (1/15): LDL 106, HDL 41, K 3.9, creatinine 1.6, LFTs normal Labs (2/15): K 3.7, creatinine 1.1, BNP 71 Labs (3/15): HCT 36.7 Labs (4/15): K 3.7, creatinine 1.1, HCT 35.5 Labs (7/15): HCT 36.4 Labs (11/15): HCT 32.1, K 4.2, creatinine 1.06 Labs (12/15): hgb 12 Labs (1/16): HCT 37.1 Labs (3/16): LDL 25, HDL 42, elevated transaminases Labs (6/16): HCT 39.6, plts 146 Labs (7/16): LDL 59, HDL 41, AST 34, ALT 49, AP 125 Labs (11/16): HCT 38.8, alkaline phosphatase 156, AST/ALT normal, K 4, creatinine 1.0 Labs (3/17): K 4.4, creatinine 1.4, LFTs normal, hgb 12.5 Labs (5/17): HCT 41.8, K 4, creatinine 1.3 Labs (9/17): K 4, creatinine 0.43, hgb 13.6 Labs (10/17): LDL 42, HDL 43  PMH: 1. Sick sinus syndrome s/p [employer]  PCM.  2. HTN 3. H/o SBO 4. Renal cell carcinoma s/p right nephrectomy in 2008.  5. 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  Fibroscan in 11/14 showed advanced fibrosis concerning for cirrhosis. EGD in 11/14 showed no varices.  6. Diastolic CHF: Echo (1/61) with EF 60-65%, bioprosthetic aortic valve with mean gradient 15 mmHg, mild MR.  CPX (9/14): peak VO2 10.4, VE/VCO2 43.8, inferolateral ST depression and ST elevation in V1/V2 with severe dyspnea and chest pain, severe functional limitation with ischemic changes.  Echo (9/14) with EF 60-65%, normal RV size and  systolic function, mild-moderate MR, bioprosthetic aortic valve normal, PA systolic pressure 51 mmHg, no evidence for pericardial constriction though exam incomplete for constriction.  Echo (11/15) with EF 55-60%, bioprosthetic aortic valve, mild-moderate MR, moderate TR, PA systolic pressure 42 mmHg.  - TEE (5/17) with EF 60-65%, normal bioprosthetic aortic valve with mean gradient 13 mmHg, normal RV size and systolic function. 7. TAH-BSO 8. Type II diabetes: Has insulin pump.  9. H/o ischemic colitis. 10. H/o PUD. 11. Diabetic gastroparesis. 12. Hyperlipidemia: Myalgias with Crestor and atorvastatin, elevated LFTs with simvastatin.  Myalgias with > 10 mg pravastatin.  13. CKD 14. Bicuspid aortic valve s/p 19 mm Edwards pericardial valve in 1/11 (had post-operative Dresslers syndrome).   15. CAD: CABG with SVG-RCA in 12/05.  Redo SVG-RCA in 1/11 with AVR.  LHC/RHC (9/14) with mean RA 18, PA 62/25 mean 43, mean PCWP 26, CI 2.8, 70-80% distal LM stenosis, 80% ostial LAD stenosis, total occlusion RCA, SVG-PDA patent.  Patient had DES LM into LAD.  Plan to continue long-term ASA/Brilinta (poor responder to Plavix).  ETT-cardiolite (4/15) with 6' exercise, EF 60%, ST depression in recovery and mild chest pain, no ischemia or infarction by perfusion images.  Unstable angina 11/15 with 80% LM, patent SVG-RCA => DES to ostial LM.   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  17. Anemia: Iron-deficiency.  Suspect chronic GI blood loss.  EGD in 9/14 was unremarkable.  Capsule endoscopy 10/14 was unremarkable.  Has required periodic blood transfusions.  18. Atrial fibrillation: Paroxysmal, noted by [facility] interrogation.  19. Carotid stenosis: Carotid dopplers (2/15) with 40-59% bilateral stenosis. Carotid dopplers (3/16) with 40-59% bilateral ICA stenosis. Carotid dopplers (3/17) with 40-59% bilateral ICA stenosis.  20. 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  22. C difficile colitis 2017 23. Gout  SH: Married, lives in [place], nonsmoker.   FH: CAD  ROS: All systems reviewed and negative except as per HPI.   Current Outpatient Prescriptions  Medication Sig Dispense Refill  . acyclovir cream (ZOVIRAX) 5 % apply to affected areas once daily needed for rash    . Alirocumab (PRALUENT) 150 MG/ML SOPN Inject 1 pen into the skin every 14 (fourteen) days. 6 pen 3  . ALPRAZolam (XANAX) 0.5 MG tablet Take 0.5 mg by mouth at bedtime. May take additional dose daily for anxiety    . amLODipine (NORVASC) 5 MG tablet Take 1 tablet (5 mg total) by mouth daily. 30 tablet 11  . APIDRA 100 UNIT/ML injection INFUSE THROUGH INSULIN PUMP UTD. Has been receiving 82-125 units daily over last 10 days.  Asked pt to bring with her on admission 10/25  2  . aspirin EC 81 MG tablet Take 81 mg by mouth at bedtime.    Marland Kitchen azithromycin (ZITHROMAX) 500 MG tablet 1 tablet by mouth  one hour prior to dental appointment/procedure 2 tablet 0  . Calcium Carbonate-Vitamin D (RA CALCIUM PLUS VITAMIN D) 600-400 MG-UNIT per tablet Take 1 tablet by mouth daily.     . Cholecalciferol 2000 UNITS TABS Take 1 tablet by mouth daily.    . clobetasol (TEMOVATE) 0.05 % external solution Apply 1 application topically daily as needed (for hives). Reported on 12/05/2015    . colchicine 0.6 MG tablet Take 1 tablet at onset and repeate in 1 hour and then 1 daily x 2  0  . Fe Fum-FA-B Cmp-C-Zn-Mg-Mn-Cu (HEMATINIC PLUS COMPLEX) 106-1 MG TABS Take 1 tablet daily with with a Vitamin C tablet 90 each 3  . furosemide (LASIX) 80 MG tablet TAKE 1 TABLET BY MOUTH DAILY 90 tablet 1  . glucagon (GLUCAGON EMERGENCY) 1 MG injection Inject 1 mg into the vein once as needed (for severe reaction). Reported on 12/05/2015    . isosorbide mononitrate (IMDUR) 60 MG 24 hr tablet TAKE 1 TABLET BY MOUTH ONCE DAILY 90  tablet 2  . Magnesium Oxide (MAG-OXIDE PO) Take 250 mg by mouth daily.    . metoprolol (LOPRESSOR) 50 MG tablet TAKE 1 AND 1/2 TABLETS(75 MG) BY MOUTH TWICE DAILY 270 tablet 3  . Multiple Vitamins-Minerals (MULTIVITAMIN WITH MINERALS) tablet Take 1 tablet by mouth daily.      . nitroGLYCERIN (NITROLINGUAL) 0.4 MG/SPRAY spray Place 1 spray under the tongue every 5 (five) minutes as needed for chest pain. 12 g 1  . omeprazole (PRILOSEC) 20 MG capsule Take 20 mg by mouth daily.    . ONE TOUCH ULTRA TEST test strip 1 each by Other route as needed for other.     . potassium chloride (K-DUR,KLOR-CON) 10 MEQ tablet Take 40 mEq by mouth daily.    . pravastatin (PRAVACHOL) 10 MG tablet TAKE 1 TABLET BY MOUTH ONCE DAILY 90 tablet 3  . ticagrelor (BRILINTA) 60 MG TABS tablet Take 60 mg by mouth 2 (two) times daily.    Marland Kitchen ULORIC 40 MG tablet Take 40 mg by mouth daily.  11  . vitamin C (ASCORBIC ACID) 250 MG tablet Take 250 mg by mouth 2 (two) times daily.     No current facility-administered medications for this visit.     BP (!) 122/58   Pulse 82   Ht 5' 2.75" (1.594 m)   Wt 123 lb 1.9 oz (55.8 kg)   SpO2 99%   BMI 21.98 kg/m  General: NAD Neck: JVP 7 cm, no thyromegaly or thyroid nodule.  Lungs: Clear to auscultation bilaterally with normal respiratory effort. CV: Nondisplaced PMI.  Heart regular S1/S2, no S3/S4, 2/6 early systolic murmur RUSB.  No peripheral edema.  Left>right carotid bruit.  2+ PT pulses bilaterally.  Abdomen: Soft, nontender, no hepatosplenomegaly, no distention.  Skin: Intact without lesions or rashes.  Neurologic: Alert and oriented x 3.  Psych: Normal affect. Extremities: No clubbing or cyanosis.   Assessment/Plan: 1. CAD: s/p SVG-RCA in 12/05, then redo SVG-RCA in 1/11.  She was found to have distal left main/proximal LAD severe stenosis in 9/14.  She was very symptomatic.  PCI with DES to distal LM/proximal LAD was done rather than bypass as she would have been a poor  candidate for redo sternotomy (2 prior sternotomies and history of chest wall radiation for Hodgkins disease).  Recurrent unstable angina with 80% ostial LM treated with DES in 11/15.  No chest pain since that time.  She will need to be on Brilinta and  ASA (poor responder to plavix) long-term.  This has been complicated by suspected slow GI bleed. Hemoglobin stable recently.  - Brilinta now decreased to 60 mg bid.  - Continue ASA 81, statin, metoprolol, Imdur without changes.    2. Bioprosthetic aortic valve replacement: Stable on recent TEE.  3. Diastolic CHF: Euvolemic on exam. - Continue Lasix 80 mg daily, takes an extra 40 mg in the afternoon when she has edema/abdominal distention. Check BMET/Mg today.  4. CKD:  Patient has a single kidney s/p right nephrectomy.  She sees nephrology. Creatinine stable. 5. Hyperlipidemia: She has not been able to tolerate any higher potency statin than pravastatin 10 mg daily.  She is now on Praluent with excellent LDL.     6. Anemia: Suspected slow GI bleed.  Unremarkable EGD and capsule endoscopy in fall 2014.  Unfortunately, needs to stay on Brilinta long-term (Plavix non-responder).  Will need to follow closely (sees hematology), plan for transfusion with hemoglobin < 8 as this allows best control of her symptoms. Recently, hemoglobin has been more stable.   7. Atrial fibrillation: Paroxysmal, brief episodes noted on PCM interrogation.  Given GI bleeding and need to be on both ASA 81 and Brilinta, risks likely outweigh benefits of anticoagulation.   - She tends to have short atrial fibrillation episodes.  She is not a good candidate for Ic agents.  She is not a good Multaq candidate with CHF.  If she needs an antiarrhythmic, consider Tikosyn or Sotalol (though long 1st degree AVB makes these less appealing).  - We were unable to place Watchman device due to nickel allergy.    8. Carotid bruit: Mild to moderate bilateral stenosis.  Repeat in 3/18. 9. SSS:  Pacemaker in place.  10. HTN: BP controlled on amlodipine.   Followup in 4 months with me in the HF clinic.    [name] 05/06/2016

## 2016-05-08 ENCOUNTER — Telehealth: Payer: Self-pay | Admitting: Cardiology

## 2016-05-08 ENCOUNTER — Ambulatory Visit (INDEPENDENT_AMBULATORY_CARE_PROVIDER_SITE_OTHER): Payer: Medicare Other | Admitting: *Deleted

## 2016-05-08 DIAGNOSIS — I495 Sick sinus syndrome: Secondary | ICD-10-CM | POA: Diagnosis not present

## 2016-05-08 NOTE — Progress Notes (Signed)
Remote pacemaker transmission.   

## 2016-05-08 NOTE — Telephone Encounter (Signed)
New Message  Pt voiced returning nurses call.

## 2016-05-08 NOTE — Telephone Encounter (Signed)
Pt is aware of lab results. Pt verbalized understanding.

## 2016-05-09 ENCOUNTER — Encounter: Payer: Self-pay | Admitting: Cardiology

## 2016-05-09 ENCOUNTER — Other Ambulatory Visit: Payer: Self-pay | Admitting: Pharmacist

## 2016-05-09 MED ORDER — ALIROCUMAB 150 MG/ML ~~LOC~~ SOPN: 1.0000 "pen " | PEN_INJECTOR | SUBCUTANEOUS | 3 refills | Status: DC

## 2016-05-13 ENCOUNTER — Encounter: Payer: Self-pay | Admitting: Cardiology

## 2016-05-14 ENCOUNTER — Other Ambulatory Visit: Payer: Self-pay | Admitting: *Deleted

## 2016-05-14 MED ORDER — NITROGLYCERIN 0.4 MG/SPRAY TL SOLN: 1.0000 | 1 refills | Status: DC | PRN

## 2016-05-14 MED ORDER — AMLODIPINE BESYLATE 5 MG PO TABS: 5.0000 mg | ORAL_TABLET | Freq: Every day | ORAL | 3 refills | Status: DC

## 2016-05-24 LAB — CUP PACEART REMOTE DEVICE CHECK
Battery Remaining Longevity: 156 mo
Battery Voltage: 2.81 V
Brady Statistic AP VP Percent: 0 %
Brady Statistic AP VS Percent: 0 %
Brady Statistic AS VP Percent: 0 %
Date Time Interrogation Session: 20171017134648
Implantable Lead Implant Date: 20140130
Implantable Lead Model: 5076
Implantable Pulse Generator Implant Date: 20140130
Lead Channel Impedance Value: 441 Ohm
Lead Channel Impedance Value: 659 Ohm
Lead Channel Pacing Threshold Amplitude: 0.625 V
Lead Channel Pacing Threshold Amplitude: 0.75 V
Lead Channel Pacing Threshold Pulse Width: 0.4 ms
Lead Channel Setting Pacing Amplitude: 2.5 V
Lead Channel Setting Sensing Sensitivity: 5.6 mV
MDC IDC LEAD IMPLANT DT: 20140130
MDC IDC LEAD LOCATION: 753859
MDC IDC LEAD LOCATION: 753860
MDC IDC MSMT BATTERY IMPEDANCE: 161 Ohm
MDC IDC MSMT LEADCHNL RA SENSING INTR AMPL: 2.8 mV
MDC IDC MSMT LEADCHNL RV PACING THRESHOLD PULSEWIDTH: 0.4 ms
MDC IDC MSMT LEADCHNL RV SENSING INTR AMPL: 16 mV
MDC IDC SET LEADCHNL RA PACING AMPLITUDE: 1.5 V
MDC IDC SET LEADCHNL RV PACING PULSEWIDTH: 0.4 ms
MDC IDC STAT BRADY AS VS PERCENT: 100 %

## 2016-05-25 ENCOUNTER — Telehealth: Payer: Self-pay

## 2016-05-25 NOTE — Telephone Encounter (Signed)
Ms.Franchino called stating that she has a knot the size of a thumb nail on the left lower side of her left mastectomy site. It is moveable above and below the rib. She did not know if she needed to see Dr. Marko Plume or Dr. Donne Hazel. Told Ms Reimers that Dr. Marko Plume said that she should see Dr. Donne Hazel.  He could do a bx if necessary. Ms Margolis will call his office for an appointment.

## 2016-05-30 ENCOUNTER — Telehealth: Payer: Self-pay | Admitting: Cardiology

## 2016-05-30 NOTE — Telephone Encounter (Signed)
Pt advised, will monitor symptoms.

## 2016-05-30 NOTE — Telephone Encounter (Signed)
New message      Pt c/o medication issue:  1. Name of Medication: brilinta  2. How are you currently taking this medication (dosage and times per day)? 31m daily 3. Are you having a reaction (difficulty breathing--STAT)?  no 4. What is your medication issue? Around 11:30 this am, pt stated her hand had a stinging sensation.  Hour or so later, she saw a big knot on her hand. She called the nurse line thru her ins co and they suggested she call her cardiologist because she is on a blood thinner.  Pt states she did not hit her hand or cause any injury to explain the sudden stinging and knot.  Could this be coming from the blood thinner

## 2016-05-30 NOTE — Telephone Encounter (Signed)
She can use ice, do not change Brilinta.

## 2016-05-30 NOTE — Telephone Encounter (Signed)
Pt states that earlier today she was walking and noticed stinging pain in left hand, at the base of middle finger. Pt states it  looked normal at that time.   Pt states a short time ago she noticed a knot ,about the size of a walnut, at the base of middle finger to ring finger on the back of her hand. Pt states there is some redness on the knuckle of finger, there does appear to be some bruising on the back of her hand about the size of a teaspoon.  Pt denies any injury to her hand, she does not remember bumping/hitting her hand on anything.   Pt advised to keep hand elevated. Pt  asking if she should adjust Brilinta, should use ice on her hand/fingers, or other recommendations. Pt advised I will forward to [name] for review.

## 2016-06-05 ENCOUNTER — Telehealth: Payer: Self-pay | Admitting: Cardiology

## 2016-06-05 NOTE — Telephone Encounter (Signed)
Pt states she was out of town visiting her daughter last week. Pt states her weight on her home scales before she went out of town was 118 lbs. Pt states she noticed some new LE edema on Sunday night, no increase in shortness of breath. Pt states her weight on her scales yesterday was 123.9. Pt states she took extra lasix 55m yesterday in addition to her usual 854mdaily.  Pt states her weight this morning was 122.1 Pt asking if she should continue extra lasix for a couple more days, pt asking if she should also take additional KCL usual dose 40 mEq daily)  if she takes extra lasix.   Pt advised I will forward to Dr McAundra Dubinor review.

## 2016-06-05 NOTE — Telephone Encounter (Signed)
Pt advised,verbalized understanding. 

## 2016-06-05 NOTE — Telephone Encounter (Signed)
New Message  Pt c/o swelling: STAT is pt has developed SOB within 24 hours  1. How long have you been experiencing swelling? 3 days  2. Where is the swelling located? Both ankles  3.  Are you currently taking a "fluid pill"? Yes, Lasix  4.  Are you currently SOB? No  5.  Have you traveled recently? No  Pt voiced painful swelling both ankles close to base of foot, left ankles looks like brusing where ankles have swollen  Pt voiced she took extra 40 mg lasix yesterday and ankles are still swollen  Pt voiced wanting to know if it's okay to take extra 40 mg to additional 80 mg or how many days does she needs to take before taking another lasix or before seeing Md.  Please f/u with pt

## 2016-06-05 NOTE — Telephone Encounter (Signed)
Take Lasix 80 qam/40 qpm x 3 more days and KCl 40 qam/20 qpm for 3 more days then back to her baseline dose.

## 2016-06-08 ENCOUNTER — Telehealth: Payer: Self-pay | Admitting: Cardiology

## 2016-06-08 NOTE — Telephone Encounter (Signed)
I don't think Brilinta is causing the swelling.  Would continue.  May want to have PCP take a look at the bumps.

## 2016-06-08 NOTE — Telephone Encounter (Signed)
Notified her that Dr. Aundra Dubin does want her to stay on Brilinta.  Also suggested that she see her PCP regarding the areas on her wrist and legs.  She states she has a physical scheduled Mon with him and will mention the areas.  Advised to monitor her wt and swelling and if increases again to call us back.  She verbalizes understanding and will call if necessary.

## 2016-06-08 NOTE — Telephone Encounter (Signed)
Calling stating that the swelling in her legs is somewhat better.  States yesterday wt was 120.5 and swelling less.  Today she describes swelling as moderate.  Has not weighted today due to being out of town.  She is most concerned that she has a "raised" area on her (R) wrist and 2 on her (L) leg and (1) on her  (R) ankle.  They have been there since Monday when she had the severe swelling.  Describes areas as very tender spots.  Denies SOB except when she tried to sing in choir.  She is concerned that the Brilinta is causing the swelling.  Advised that Dr. Aundra Dubin not in this office today but will forward message to him.  She is going to stay in [place] until she hears back from [name].

## 2016-06-08 NOTE — Telephone Encounter (Signed)
Mrs.Lawerence is calling because her legs are still swollen and was told to call back if they were not better . Please call   Thanks

## 2016-06-12 ENCOUNTER — Other Ambulatory Visit: Payer: Self-pay | Admitting: Oncology

## 2016-06-12 ENCOUNTER — Telehealth: Payer: Self-pay

## 2016-06-12 NOTE — Telephone Encounter (Signed)
[name] [name] called stating that  She saw her PCP yesterday for follow up. She mentioned that she was having some unusual bruising so [name] [name] ordered a CBC. She received a call from his nurse stating that her platelets were low and she should come back in a week for a follow up cbc. [name] [name] wants some direction. Should she come to the [facility] for repeat labs and see [name] [name]?   Obtained office note and lab results  from [name] [name] office. 06-11-16 Platelets were 127K Hgb 13.4 Hct   40.2 WBC 8.2  Platelet count at [facility] on 03-29-16 was 133K.  Information sent to [name] [name] for review.

## 2016-06-12 NOTE — Telephone Encounter (Signed)
Told [name] [name] Dr. Mariana Kaufman response to lab and bruising as noted below.  Pt agreeable wit Dr. Mariana Kaufman plan of follow up as noted below.

## 2016-06-12 NOTE — Telephone Encounter (Signed)
  [name] [name], [place] Female, 65 y.o., [birth year] Weight:  123 lb 1.9 oz (55.8 kg) Phone:  [phone] PCP:  Sharilyn Sites, MD [place] Designated Party Release  General MRN:  [id number] MyChart:  Active Next Appt:  07/26/2016 platelet question  Received: Today  Message Contents  [name], MD  Baruch Merl, RN; Janace Hoard, RN        RN note seen from 11-21:  [name] called stating that She saw her PCP yesterday for follow up.  She mentioned that she was having some unusual bruising so Dr. Hilma Favors ordered a CBC.  She received a call from his nurse stating that her platelets were low and she should come back in a week for a follow up cbc.  [name] wants some direction. Should she come to the [facility] for repeat labs and see Dr. Marko Plume?      Obtained office note and lab results from Dr. Delanna Ahmadi office.  06-11-16 Platelets were 127K  Hgb 13.4  Hct  40.2  WBC 8.2    Platelet count at [facility] on 03-29-16 was 133K.    MD response: Platelet count is stable compared with [facility] labs, and other counts are great. Bruising also not unexpected with Brilinta and ASA, probably does not take much trauma at all to bruise just from those meds. I do not think she needs anything additional now. She has appointment with me early Jan, which is fine.   thanks

## 2016-06-18 ENCOUNTER — Encounter: Payer: Self-pay | Admitting: Cardiology

## 2016-06-28 ENCOUNTER — Other Ambulatory Visit: Payer: Self-pay | Admitting: Cardiology

## 2016-06-28 DIAGNOSIS — I6523 Occlusion and stenosis of bilateral carotid arteries: Secondary | ICD-10-CM

## 2016-07-19 ENCOUNTER — Ambulatory Visit: Payer: Medicare Other | Admitting: Oncology

## 2016-07-19 ENCOUNTER — Other Ambulatory Visit: Payer: Medicare Other

## 2016-07-22 ENCOUNTER — Other Ambulatory Visit: Payer: Self-pay | Admitting: Cardiology

## 2016-07-24 ENCOUNTER — Other Ambulatory Visit: Payer: Self-pay | Admitting: Oncology

## 2016-07-24 ENCOUNTER — Telehealth (HOSPITAL_COMMUNITY): Payer: Self-pay

## 2016-07-24 DIAGNOSIS — D696 Thrombocytopenia, unspecified: Secondary | ICD-10-CM

## 2016-07-24 DIAGNOSIS — D0512 Intraductal carcinoma in situ of left breast: Secondary | ICD-10-CM

## 2016-07-24 DIAGNOSIS — D500 Iron deficiency anemia secondary to blood loss (chronic): Secondary | ICD-10-CM

## 2016-07-24 NOTE — Telephone Encounter (Signed)
Received call from [place] stating patient has had increased weight, swelling, SOB, and fatigue for a week, and has doubled her lasix for 4 days with minimal improvement. Will add on to NP clinic tomorrow to evaluate. Patient does not sound SOB in conversation over phone at this time. Advised if s/s worsen to call CHF clinic or come to ED.  Renee Pain, RN

## 2016-07-25 ENCOUNTER — Ambulatory Visit (HOSPITAL_COMMUNITY)
Admission: RE | Admit: 2016-07-25 | Discharge: 2016-07-25 | Disposition: A | Discharge status: Home / Self Care | Payer: Medicare Other | Source: Ambulatory Visit | Attending: Cardiology | Admitting: Cardiology

## 2016-07-25 VITALS — BP 134/62 | HR 86 | Wt 121.2 lb

## 2016-07-25 DIAGNOSIS — Z8571 Personal history of Hodgkin lymphoma: Secondary | ICD-10-CM | POA: Diagnosis not present

## 2016-07-25 DIAGNOSIS — N189 Chronic kidney disease, unspecified: Secondary | ICD-10-CM | POA: Diagnosis not present

## 2016-07-25 DIAGNOSIS — Z7982 Long term (current) use of aspirin: Secondary | ICD-10-CM | POA: Diagnosis not present

## 2016-07-25 DIAGNOSIS — Z8711 Personal history of peptic ulcer disease: Secondary | ICD-10-CM | POA: Insufficient documentation

## 2016-07-25 DIAGNOSIS — Z91048 Other nonmedicinal substance allergy status: Secondary | ICD-10-CM | POA: Insufficient documentation

## 2016-07-25 DIAGNOSIS — Z794 Long term (current) use of insulin: Secondary | ICD-10-CM | POA: Insufficient documentation

## 2016-07-25 DIAGNOSIS — Z950 Presence of cardiac pacemaker: Secondary | ICD-10-CM | POA: Diagnosis not present

## 2016-07-25 DIAGNOSIS — Z905 Acquired absence of kidney: Secondary | ICD-10-CM | POA: Diagnosis not present

## 2016-07-25 DIAGNOSIS — I10 Essential (primary) hypertension: Secondary | ICD-10-CM | POA: Diagnosis not present

## 2016-07-25 DIAGNOSIS — I5033 Acute on chronic diastolic (congestive) heart failure: Secondary | ICD-10-CM | POA: Insufficient documentation

## 2016-07-25 DIAGNOSIS — M109 Gout, unspecified: Secondary | ICD-10-CM | POA: Insufficient documentation

## 2016-07-25 DIAGNOSIS — Z9013 Acquired absence of bilateral breasts and nipples: Secondary | ICD-10-CM | POA: Diagnosis not present

## 2016-07-25 DIAGNOSIS — E785 Hyperlipidemia, unspecified: Secondary | ICD-10-CM | POA: Diagnosis not present

## 2016-07-25 DIAGNOSIS — I5022 Chronic systolic (congestive) heart failure: Secondary | ICD-10-CM | POA: Diagnosis present

## 2016-07-25 DIAGNOSIS — I480 Paroxysmal atrial fibrillation: Secondary | ICD-10-CM

## 2016-07-25 DIAGNOSIS — E1122 Type 2 diabetes mellitus with diabetic chronic kidney disease: Secondary | ICD-10-CM | POA: Insufficient documentation

## 2016-07-25 DIAGNOSIS — I6523 Occlusion and stenosis of bilateral carotid arteries: Secondary | ICD-10-CM | POA: Diagnosis not present

## 2016-07-25 DIAGNOSIS — Z953 Presence of xenogenic heart valve: Secondary | ICD-10-CM | POA: Diagnosis not present

## 2016-07-25 DIAGNOSIS — Z951 Presence of aortocoronary bypass graft: Secondary | ICD-10-CM | POA: Diagnosis not present

## 2016-07-25 DIAGNOSIS — Z85528 Personal history of other malignant neoplasm of kidney: Secondary | ICD-10-CM | POA: Diagnosis not present

## 2016-07-25 DIAGNOSIS — Z9889 Other specified postprocedural states: Secondary | ICD-10-CM | POA: Diagnosis not present

## 2016-07-25 DIAGNOSIS — E1143 Type 2 diabetes mellitus with diabetic autonomic (poly)neuropathy: Secondary | ICD-10-CM | POA: Insufficient documentation

## 2016-07-25 DIAGNOSIS — Z8249 Family history of ischemic heart disease and other diseases of the circulatory system: Secondary | ICD-10-CM | POA: Insufficient documentation

## 2016-07-25 DIAGNOSIS — I495 Sick sinus syndrome: Secondary | ICD-10-CM | POA: Diagnosis not present

## 2016-07-25 DIAGNOSIS — I5032 Chronic diastolic (congestive) heart failure: Secondary | ICD-10-CM | POA: Diagnosis not present

## 2016-07-25 DIAGNOSIS — I2510 Atherosclerotic heart disease of native coronary artery without angina pectoris: Secondary | ICD-10-CM | POA: Diagnosis not present

## 2016-07-25 DIAGNOSIS — I25110 Atherosclerotic heart disease of native coronary artery with unstable angina pectoris: Secondary | ICD-10-CM

## 2016-07-25 DIAGNOSIS — D649 Anemia, unspecified: Secondary | ICD-10-CM | POA: Diagnosis not present

## 2016-07-25 LAB — BASIC METABOLIC PANEL
ANION GAP: 14 (ref 5–15)
BUN: 23 mg/dL — ABNORMAL HIGH (ref 6–20)
CALCIUM: 10.4 mg/dL — AB (ref 8.9–10.3)
CO2: 25 mmol/L (ref 22–32)
Chloride: 103 mmol/L (ref 101–111)
Creatinine, Ser: 1.03 mg/dL — ABNORMAL HIGH (ref 0.44–1.00)
GFR calc Af Amer: >60 mL/min (ref >60)
GFR calc non Af Amer: 57 mL/min — ABNORMAL LOW (ref >60)
Glucose, Bld: 122 mg/dL — ABNORMAL HIGH (ref 65–99)
Potassium: 3.9 mmol/L (ref 3.5–5.1)
Sodium: 142 mmol/L (ref 135–145)

## 2016-07-25 LAB — BRAIN NATRIURETIC PEPTIDE: B Natriuretic Peptide: 71.9 pg/mL (ref 0.0–100.0)

## 2016-07-25 MED ORDER — TORSEMIDE 20 MG PO TABS: 40.0000 mg | ORAL_TABLET | Freq: Every day | ORAL | 3 refills | Status: DC

## 2016-07-25 MED ORDER — POTASSIUM CHLORIDE CRYS ER 10 MEQ PO TBCR: 40.0000 meq | EXTENDED_RELEASE_TABLET | Freq: Two times a day (BID) | ORAL | 3 refills | Status: DC

## 2016-07-25 NOTE — Progress Notes (Signed)
Patient ID: [name] [name], female   DOB: 06/03/1951, 66 y.o.   MRN: [id number] PCP: Dr. Hilma Favors Oncologist: Dr Marko Plume  66 yo with history of CAD s/p CABG in 12/05 and redo in 1/11, bioprosthetic AVR, diastolic CHF, and sick sinus syndrome s/p PCM presents for cardiology followup.  She had initial CABG with SVG-RCA in 12/05 followed by redo SVG-RCA and bioprosthetic AVR in 1/11.  She has had diastolic CHF and is on Lasix.  In 9/14, she had a CPX test.  This showed severe functional limitation with ischemic ECG changes (inferolateral ST depression and ST elevation in V1 and V2). She had chest pain.  She had R/LHC in 9/14.  This showed elevated left and right heart filling pressures, patent SVG-PDA, and 80% distal LM/80% ostial LAD stenosis.  She was not a candidate for redo CABG (had 2 prior sternotomies as well as chest radiation for Hodgkins lymphoma).  She had DES from left main into proximal LAD and will need long-term DAPT => Brilinta was used as she has been a poor responder to Plavix in the past.  She re-developed angina in 11/15 and was admitted with unstable angina.  LHC showed 80% ostial LM and patent SVG-RCA. She had DES to ostial LM. Last echo in 11/15 showed EF 55-60%, normal bioprosthetic aortic valve, mild-moderate MR, moderate TR.    She has chronic iron deficiency anemia thought to be due to a slow GI bleed.  Last EGD was unremarkable and a capsule endoscopy was also unremarkable.  She has had periodic transfusions; However, recently hemoglobin has been stable.    She had bilateral mastectomy for DCIS in 16/96 without complication.  She has an insulin pump followed by an endocrinologist at [facility].   Given myalgias with statins, she was started on Praluent.  LDL is now excellent.    In the past she has had short runs of atrial fibrillation.  No prolonged episodes (beyond an hour or two) that she has felt.  She is not anticoagulated.  Planned for Watchman placement, but given her nickel  allergy, we were unable to place a Watchman.   Today she returns for follow up. Overall feeling ok. Having episodes of leg edema. She has been taking an extra 40 mg of lasix 3-4 times a week. Denies CP. Feels like she is in and out of A fib 1-2 minutes a week. SOB with steps. Sleeps with HOB elevated.  Weight at home 117-125 pounds. Baseline 117-119 pounds. Taking all medications. Lives with her husband.    Labs (7/14): hemoglobin 11 Labs (9/14): K 3.9, creatinine 1.48, hemoglobin 7.3 Labs (10/14): hemoglobin 7.7 Labs (11/14): K 3.8, creatinine 1.5 Labs (12/14): HCT 37.7 Labs (1/15): LDL 106, HDL 41, K 3.9, creatinine 1.6, LFTs normal Labs (2/15): K 3.7, creatinine 1.1, BNP 71 Labs (3/15): HCT 36.7 Labs (4/15): K 3.7, creatinine 1.1, HCT 35.5 Labs (7/15): HCT 36.4 Labs (11/15): HCT 32.1, K 4.2, creatinine 1.06 Labs (12/15): hgb 12 Labs (1/16): HCT 37.1 Labs (3/16): LDL 25, HDL 42, elevated transaminases Labs (6/16): HCT 39.6, plts 146 Labs (7/16): LDL 59, HDL 41, AST 34, ALT 49, AP 125 Labs (11/16): HCT 38.8, alkaline phosphatase 156, AST/ALT normal, K 4, creatinine 1.0 Labs (3/17): K 4.4, creatinine 1.4, LFTs normal, hgb 12.5 Labs (5/17): HCT 41.8, K 4, creatinine 1.3 Labs (9/17): K 4, creatinine 0.43, hgb 13.6 Labs (10/17): LDL 42, HDL 43  PMH: 1. Sick sinus syndrome s/p [employer] PCM.  2. HTN 3. H/o SBO 4.  Renal cell carcinoma s/p right nephrectomy in 2008.  5. 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  Fibroscan in 11/14 showed advanced fibrosis concerning for cirrhosis. EGD in 11/14 showed no varices.  6. Diastolic CHF: Echo (8/56) with EF 60-65%, bioprosthetic aortic valve with mean gradient 15 mmHg, mild MR.  CPX (9/14): peak VO2 10.4, VE/VCO2 43.8, inferolateral ST depression and ST elevation in V1/V2 with severe dyspnea and chest pain, severe functional limitation with ischemic changes.  Echo (9/14) with EF 60-65%, normal RV size and systolic function, mild-moderate MR,  bioprosthetic aortic valve normal, PA systolic pressure 51 mmHg, no evidence for pericardial constriction though exam incomplete for constriction.  Echo (11/15) with EF 55-60%, bioprosthetic aortic valve, mild-moderate MR, moderate TR, PA systolic pressure 42 mmHg.  - TEE (5/17) with EF 60-65%, normal bioprosthetic aortic valve with mean gradient 13 mmHg, normal RV size and systolic function. 7. TAH-BSO 8. Type II diabetes: Has insulin pump.  9. H/o ischemic colitis. 10. H/o PUD. 11. Diabetic gastroparesis. 12. Hyperlipidemia: Myalgias with Crestor and atorvastatin, elevated LFTs with simvastatin.  Myalgias with > 10 mg pravastatin.  13. CKD 14. Bicuspid aortic valve s/p 19 mm Edwards pericardial valve in 1/11 (had post-operative Dresslers syndrome).   15. CAD: CABG with SVG-RCA in 12/05.  Redo SVG-RCA in 1/11 with AVR.  LHC/RHC (9/14) with mean RA 18, PA 62/25 mean 43, mean PCWP 26, CI 2.8, 70-80% distal LM stenosis, 80% ostial LAD stenosis, total occlusion RCA, SVG-PDA patent.  Patient had DES LM into LAD.  Plan to continue long-term ASA/Brilinta (poor responder to Plavix).  ETT-cardiolite (4/15) with 6' exercise, EF 60%, ST depression in recovery and mild chest pain, no ischemia or infarction by perfusion images.  Unstable angina 11/15 with 80% LM, patent SVG-RCA => DES to ostial LM.   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  17. Anemia: Iron-deficiency.  Suspect chronic GI blood loss.  EGD in 9/14 was unremarkable.  Capsule endoscopy 10/14 was unremarkable.  Has required periodic blood transfusions.  18. Atrial fibrillation: Paroxysmal, noted by [facility] interrogation.  19. Carotid stenosis: Carotid dopplers (2/15) with 40-59% bilateral stenosis. Carotid dopplers (3/16) with 40-59% bilateral ICA stenosis. Carotid dopplers (3/17) with 40-59% bilateral ICA stenosis.  20. 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  22. C difficile colitis 2017 23. Gout  SH: Married, lives in [place], nonsmoker.   FH: CAD  ROS: All systems reviewed and negative except as per HPI.   Current Outpatient Prescriptions  Medication Sig Dispense Refill  . acyclovir cream (ZOVIRAX) 5 % apply to affected areas once daily needed for rash    . Alirocumab (PRALUENT) 150 MG/ML SOPN Inject 1 pen into the skin every 14 (fourteen) days. 6 pen 3  . ALPRAZolam (XANAX) 0.5 MG tablet Take 0.5 mg by mouth at bedtime. May take additional dose daily for anxiety    . amLODipine (NORVASC) 5 MG tablet Take 1 tablet (5 mg total) by mouth daily. 90 tablet 3  . APIDRA 100 UNIT/ML injection INFUSE THROUGH INSULIN PUMP UTD. Has been receiving 82-125 units daily over last 10 days.  Asked pt to bring with her on admission 10/25  2  . aspirin EC 81 MG tablet Take 81 mg by mouth at bedtime.    Marland Kitchen azithromycin (ZITHROMAX) 500 MG tablet 1 tablet by mouth one hour prior to dental appointment/procedure 2 tablet  0  . BRILINTA 60 MG TABS tablet TAKE 1 TABLET(60 MG) BY MOUTH TWICE DAILY 180 tablet 2  . Calcium Carbonate-Vitamin D (RA CALCIUM PLUS VITAMIN D) 600-400 MG-UNIT per tablet Take 1 tablet by mouth daily.     . Cholecalciferol 2000 UNITS TABS Take 1 tablet by mouth daily.    . clobetasol (TEMOVATE) 0.05 % external solution Apply 1 application topically daily as needed (for hives). Reported on 12/05/2015    . colchicine 0.6 MG tablet Take 1 tablet at onset and repeate in 1 hour and then 1 daily x 2  0  . Fe Fum-FA-B Cmp-C-Zn-Mg-Mn-Cu (HEMATINIC PLUS COMPLEX) 106-1 MG TABS Take 1 tablet daily with with a Vitamin C tablet 90 each 3  . furosemide (LASIX) 80 MG tablet TAKE 1 TABLET BY MOUTH DAILY 90 tablet 1  . glucagon (GLUCAGON EMERGENCY) 1 MG injection Inject 1 mg into the vein once as needed (for severe reaction). Reported on 12/05/2015    . isosorbide mononitrate  (IMDUR) 60 MG 24 hr tablet TAKE 1 TABLET BY MOUTH ONCE DAILY 90 tablet 3  . Magnesium Oxide (MAG-OXIDE PO) Take 250 mg by mouth daily.    . metoprolol (LOPRESSOR) 50 MG tablet TAKE 1 AND 1/2 TABLETS(75 MG) BY MOUTH TWICE DAILY 270 tablet 3  . Multiple Vitamins-Minerals (MULTIVITAMIN WITH MINERALS) tablet Take 1 tablet by mouth daily.      . nitroGLYCERIN (NITROLINGUAL) 0.4 MG/SPRAY spray Place 1 spray under the tongue every 5 (five) minutes as needed for chest pain. 12 g 1  . omeprazole (PRILOSEC) 20 MG capsule Take 20 mg by mouth daily.    . ONE TOUCH ULTRA TEST test strip 1 each by Other route as needed for other.     . potassium chloride (K-DUR,KLOR-CON) 10 MEQ tablet Take 40 mEq by mouth daily.    . pravastatin (PRAVACHOL) 10 MG tablet TAKE 1 TABLET BY MOUTH ONCE DAILY 90 tablet 3  . ticagrelor (BRILINTA) 60 MG TABS tablet Take 60 mg by mouth 2 (two) times daily.    Marland Kitchen ULORIC 40 MG tablet Take 40 mg by mouth daily.  11  . vitamin C (ASCORBIC ACID) 250 MG tablet Take 250 mg by mouth 2 (two) times daily.     No current facility-administered medications for this encounter.     BP 134/62   Pulse 86   Wt 121 lb 3.2 oz (55 kg)   SpO2 96%   BMI 21.64 kg/m  General: NAD. Ambulated in the clinic with her husband.  Neck: JVP 9-10 cm, no thyromegaly or thyroid nodule.  Lungs: Clear CV: Nondisplaced PMI.  Heart regular S1/S2, no S3/S4, 2/6 early systolic murmur RUSB.  R and LLE 1-2 +edema.  Left>right carotid bruit.  2+ PT pulses bilaterally.  Abdomen: Soft, nontender, no hepatosplenomegaly, no distention.  Skin: Intact without lesions or rashes.  Neurologic: Alert and oriented x 3.  Psych: Normal affect. Extremities: No clubbing or cyanosis.   EKG: Sinus Rhythm 1st degree heart block  Assessment/Plan: 1. CAD: s/p SVG-RCA in 12/05, then redo SVG-RCA in 1/11--> distal left main/proximal LAD severe stenosis in 9/14.  PCI with DES to distal LM/proximal LAD was done rather than bypass as she  would have been a poor candidate for redo sternotomy (2 prior sternotomies and history of chest wall radiation for Hodgkins disease).  Recurrent unstable angina with 80% ostial LM treated with DES in 11/15.   No chest pain. Continue Brilinta and ASA (poor responder to plavix) long-term.  This has been complicated by suspected slow GI bleed. Check CBC today.   - Brilinta now decreased to 60 mg bid.  - Continue ASA 81, statin, metoprolol, Imdur at the current doses.     2. Bioprosthetic aortic valve replacement: Stable on recent TEE.  3. A/C Diastolic CHF:  Volume status elevated. Taking extra lasix multiple days a week. Stop lasix and start torsemide 40 mg once a day and increase potassium to 40 meq twice a day.  Check BMET today.  4. CKD:  Patient has a single kidney s/p right nephrectomy.  She sees nephrology. Watch closely with increased diuretics.  5. Hyperlipidemia: She has not been able to tolerate any higher potency statin than pravastatin 10 mg daily.   Continue praluent.      6. Anemia: Suspected slow GI bleed.  Unremarkable EGD and capsule endoscopy in fall 2014.  Unfortunately, needs to stay on Brilinta long-term (Plavix non-responder).  Will need to follow closely (sees hematology), plan for transfusion with hemoglobin < 8 as this allows best control of her symptoms. Recently, hemoglobin has been more stable.   7. Atrial fibrillation: Paroxysmal, brief episodes noted on PCM interrogation.  Given GI bleeding and need to be on both ASA 81 and Brilinta, risks likely outweigh benefits of anticoagulation.   - Ongoing brief A fib episodes. She is not a good candidate for anticoagulants due to to GI bleed.  She is not a good Multaq candidate with CHF.  If she needs an antiarrhythmic, consider Tikosyn or Sotalol (though long 1st degree AVB makes these less appealing).  - We were unable to place Watchman device due to nickel allergy.   -EKG today.  8. Carotid bruit: Mild to moderate bilateral  stenosis.  Repeat in 3/18. [name] set up.  9. SSS: Pacemaker in place.  10. HTN: controlled. Continue current regimen.    BMET stable today.    Follow up in 2 weeks with [name].   Tiffiany Beadles NP-C  07/25/2016

## 2016-07-25 NOTE — Patient Instructions (Addendum)
EKG today.  Routine lab work today. Will notify you of abnormal results, otherwise no news is good news!  STOP Lasix.  START Torsemide 40 mg (2 tabs) once daily.  INCREASE Potassium to 40 meq (4 tabs) twice daily.  Will schedule you for an echocardiogram at [facility]. Address: [address] ([place]), [place], [place] [address]  Phone: [phone]  ________________________________________________________  ________________________________________________________  Follow up with Dr. Aundra Dubin in 2 weeks.  Do the following things EVERYDAY: 1) Weigh yourself in the morning before breakfast. Write it down and keep it in a log. 2) Take your medicines as prescribed 3) Eat low salt foods-Limit salt (sodium) to 2000 mg per day.  4) Stay as active as you can everyday 5) Limit all fluids for the day to less than 2 liters

## 2016-07-26 ENCOUNTER — Other Ambulatory Visit (HOSPITAL_BASED_OUTPATIENT_CLINIC_OR_DEPARTMENT_OTHER): Payer: Medicare Other

## 2016-07-26 ENCOUNTER — Ambulatory Visit (HOSPITAL_BASED_OUTPATIENT_CLINIC_OR_DEPARTMENT_OTHER): Payer: Medicare Other | Admitting: Oncology

## 2016-07-26 ENCOUNTER — Other Ambulatory Visit: Payer: Self-pay

## 2016-07-26 ENCOUNTER — Encounter: Payer: Self-pay | Admitting: Oncology

## 2016-07-26 VITALS — BP 138/56 | HR 87 | Temp 97.6°F | Resp 18 | Ht 62.75 in | Wt 124.2 lb

## 2016-07-26 DIAGNOSIS — D0512 Intraductal carcinoma in situ of left breast: Secondary | ICD-10-CM

## 2016-07-26 DIAGNOSIS — Z9012 Acquired absence of left breast and nipple: Secondary | ICD-10-CM

## 2016-07-26 DIAGNOSIS — Z923 Personal history of irradiation: Secondary | ICD-10-CM

## 2016-07-26 DIAGNOSIS — Z862 Personal history of diseases of the blood and blood-forming organs and certain disorders involving the immune mechanism: Secondary | ICD-10-CM

## 2016-07-26 DIAGNOSIS — R922 Inconclusive mammogram: Secondary | ICD-10-CM

## 2016-07-26 DIAGNOSIS — Z8571 Personal history of Hodgkin lymphoma: Secondary | ICD-10-CM

## 2016-07-26 DIAGNOSIS — R7989 Other specified abnormal findings of blood chemistry: Secondary | ICD-10-CM

## 2016-07-26 DIAGNOSIS — D696 Thrombocytopenia, unspecified: Secondary | ICD-10-CM | POA: Diagnosis not present

## 2016-07-26 DIAGNOSIS — D509 Iron deficiency anemia, unspecified: Secondary | ICD-10-CM

## 2016-07-26 DIAGNOSIS — R945 Abnormal results of liver function studies: Secondary | ICD-10-CM

## 2016-07-26 DIAGNOSIS — D500 Iron deficiency anemia secondary to blood loss (chronic): Secondary | ICD-10-CM

## 2016-07-26 LAB — COMPREHENSIVE METABOLIC PANEL
ALBUMIN: 4.4 g/dL (ref 3.5–5.0)
ALK PHOS: 248 U/L — AB (ref 40–150)
ALT: 67 U/L — ABNORMAL HIGH (ref 0–55)
ANION GAP: 13 meq/L — AB (ref 3–11)
AST: 66 U/L — AB (ref 5–34)
BUN: 27.5 mg/dL — AB (ref 7.0–26.0)
CHLORIDE: 103 meq/L (ref 98–109)
CO2: 25 mEq/L (ref 22–29)
CREATININE: 1.1 mg/dL (ref 0.6–1.1)
Calcium: 10.6 mg/dL — ABNORMAL HIGH (ref 8.4–10.4)
EGFR: 53 mL/min/{1.73_m2} — ABNORMAL LOW (ref >90)
Glucose: 171 mg/dl — ABNORMAL HIGH (ref 70–140)
POTASSIUM: 4.3 meq/L (ref 3.5–5.1)
Sodium: 141 mEq/L (ref 136–145)
Total Bilirubin: 0.59 mg/dL (ref 0.20–1.20)
Total Protein: 7.9 g/dL (ref 6.4–8.3)

## 2016-07-26 LAB — CBC WITH DIFFERENTIAL/PLATELET
BASO%: 1.4 % (ref 0.0–2.0)
Basophils Absolute: 0.1 10*3/uL (ref 0.0–0.1)
EOS%: 3.1 % (ref 0.0–7.0)
Eosinophils Absolute: 0.3 10*3/uL (ref 0.0–0.5)
HEMATOCRIT: 42.6 % (ref 34.8–46.6)
HEMOGLOBIN: 14.2 g/dL (ref 11.6–15.9)
LYMPH%: 20.8 % (ref 14.0–49.7)
MCH: 29.7 pg (ref 25.1–34.0)
MCHC: 33.3 g/dL (ref 31.5–36.0)
MCV: 89.2 fL (ref 79.5–101.0)
MONO#: 0.7 10*3/uL (ref 0.1–0.9)
MONO%: 8.3 % (ref 0.0–14.0)
NEUT#: 5.4 10*3/uL (ref 1.5–6.5)
NEUT%: 66.4 % (ref 38.4–76.8)
PLATELETS: 136 10*3/uL — AB (ref 145–400)
RBC: 4.77 10*6/uL (ref 3.70–5.45)
RDW: 14 % (ref 11.2–14.5)
WBC: 8.1 10*3/uL (ref 3.9–10.3)
lymph#: 1.7 10*3/uL (ref 0.9–3.3)

## 2016-07-26 MED ORDER — HEMATINIC PLUS COMPLEX 106-1 MG PO TABS: ORAL_TABLET | ORAL | 0 refills | Status: DC

## 2016-07-26 NOTE — Telephone Encounter (Signed)
  Annia, Gomm Female, 66 y.o., [birth year] Weight:  124 lb 3.2 oz (56.3 kg) Phone:  [phone] PCP:  Sharilyn Sites, MD [place] Designated Party Release  General MRN:  [id number] MyChart:  Active Next Appt:  07/31/2016 iron  Received: Today  Message Contents  [name], MD  Baruch Merl, RN        Please refill prescription iron to use now 3x weekly or as directed  # 60

## 2016-07-26 NOTE — Progress Notes (Signed)
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  She also had Hodgkins in 1980s,  right nephrectomy for renal cell carcinoma 2008 and radiation induced hypothyroidism. Last right 3D mammogram was at [facility] 03-15-16; she cannot have breast MRI due to pacemaker. She did not have prophylactic right mastectomy at time of left mastectomy for DCIS due to comorbidities (cardiac and fragile diabetes). Next visit [name] 09-26-16   Patient reports that she felt very well thru holidays, able to do holiday activities which she enjoyed. She has been fatigued last few days, has needed extra lasix, was SOB prior to additional doses of lasix with resolution both SOB and pedal with the lasix. Cardiology is aware, EKG reportedly fine and repeat echo upcoming. She continues Brilinta since she was unable to have left atrial appendage occlusion device due to nickel allergy.  She had low blood sugar for 2 hours early AM today, has alarm that wakens her if sugars drop. She has had no changes in left mastectomy area other than a few pruritic skin areas there and scattered elsewhere; no noted changes in right breast. Dr Isaiah Blakes plans follow up US in Feb of apparent lipoma beneath left mastectomy area.  She has had no noted bleeding, no changes in bowels, no abdominal pain. No hematuria or flank pain.  She had upper endoscopy and colonoscopy at [facility] 03-[facility], no polyps and no esophageal varices per patient and CareEverywhere She has had no recent infectious illness, and no fever, cough, bladder symptoms.   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 She has radiation induced hypothyroidism and the treatment caused or worsened coronary artery disease, also may have been etiology for left breast cancer and increases risk for right breast cancer. Fortunately the Hodgkins was cured.   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 She had CT follow up of right renal cyst by Dr Alinda Money ~ 01-2016.  DCIS left breast identified on screening tomo mammograms done at [facility] 03-14-15, with heterogeneously dense breast tissue, heterogeneous calcification left breast at 3:00 posterior and otherwise no findings of concern. She had left diagnostic mammogram at [facility] 03-16-15 with suspicious grouped calcifications identified, then tomo stereotactic needle core biopsy 03-21-14, seven cores submitted, pathology DCIS with necrosis, grade 2, ER PR negative ([place] [phone]). [name] left axilla at [facility] 03-29-15 with no evidence of malignancy. After input from general surgery, radiation oncology, cardiology and medical oncology, she had left total mastectomy by [name] on 05-17-15. Pathology  [phone]) multiple foci grade 3 DCIS largest area 1 cm, without necrosis, closest margin anterior 38m She did not have prophylactic right mastectomy due particularly to cardiac concerns.  Variable mild thrombocytopenia since ~ 06-2013, possibly medication related. Spleen size normal by [place]. Note on Brilinta  and ASA medically necessary for cardiac indications.    Objective:  Vital signs in last 24 hours:  BP (!)  138/56 (BP Location: Left Arm, Patient Position: Sitting)   Pulse 87   Temp 97.6 F (36.4 C) (Oral)   Resp 18   Ht 5' 2.75" (1.594 m)   Wt 124 lb 3.2 oz (56.3 kg)   SpO2 98%   BMI 22.18 kg/m  Weight down 2 lbs from 03-2016 Alert, oriented and appropriate, excellent historian and just delightful as always. Ambulatory without difficulty.  No alopecia  HEENT:PERRL, sclerae not icteric. Oral mucosa moist without lesions, posterior pharynx clear.  Neck supple. No JVD.  Lymphatics:no cervical,supraclavicular, axillary or inguinal adenopathy Resp: clear to auscultation bilaterally and normal percussion bilaterally Cardio: regular rate and rhythm. No gallop. GI: soft, nontender, not distended, no mass or organomegaly. Some bowel sounds. Insulin pump Musculoskeletal/ Extremities: UE / LE without pitting edema, cords, tenderness Neuro: nonfocal  PSYCH appropriate mood and affect Skin  thruout extremely dry. Slightly erythematous, excoriated areas up to ~ 2 mm diameter right anterior shoulder, midline abdominal scar and lateral portion of left mastectomy area; not vesicular, do not appear infected. Skin otherwise without ecchymosis, petechiae Breasts: Right without dominant mass, skin or nipple findings. Left mastectomy scar well healed without thickening or skin changes of concern. 3 excoriated areas ~ 2 mm diameter above mastectomy scar laterally and into lower axilla  (see skin description above) Redundant soft tissue at lower costal margin on left. Axillae benign.   Lab Results:  Results for orders placed or performed in visit on 07/26/16  CBC with Differential  Result Value Ref Range   WBC 8.1 3.9 - 10.3 10e3/uL   NEUT# 5.4 1.5 - 6.5 10e3/uL   HGB 14.2 11.6 - 15.9 g/dL   HCT 42.6 34.8 - 46.6 %   Platelets 136 (L) 145 - 400 10e3/uL   MCV 89.2 79.5 - 101.0 fL   MCH 29.7 25.1 - 34.0 pg   MCHC 33.3 31.5 - 36.0 g/dL   RBC 4.77 3.70 - 5.45 10e6/uL   RDW 14.0 11.2 - 14.5 %   lymph# 1.7 0.9  - 3.3 10e3/uL   MONO# 0.7 0.1 - 0.9 10e3/uL   Eosinophils Absolute 0.3 0.0 - 0.5 10e3/uL   Basophils Absolute 0.1 0.0 - 0.1 10e3/uL   NEUT% 66.4 38.4 - 76.8 %   LYMPH% 20.8 14.0 - 49.7 %   MONO% 8.3 0.0 - 14.0 %   EOS% 3.1 0.0 - 7.0 %   BASO% 1.4 0.0 - 2.0 %   *Note: Due to a large number of results and/or encounters for the requested time period, some results have not been displayed. A complete set of results can be found in Results Review.   CMET available after visit: Na 141, K 4.3, glu 171, BUN 27.5, creat 1.1, AP 248, AST 66, ALT 67, Tbili 0.59, T prot 7.9, alb 4.4, Ca 10.6.  Studies/Results:  Colonoscopy and endoscopy information from [facility] 04-05-16 in [place]  Report of right mammogram received from [facility], 03-15-16 diagnostic 3D  Medications: I have reviewed the patient's current medications. Will decrease Hemocyte to 2-3x weekly as hemoglobin is stable now.  DISCUSSION CBC available at time of visit today looks good, no etiology there for fatigue, fluid retention in last 1-2 weeks.  CMET available after visit with elevations in some LFTs, etiology not clear to me, would not be from known oncologic problems. WIll let patient know and update PCP and Drs  Aundra Dubin and Derrill Kay  Patient's medical oncology care will be transferred to Dr Lindi Adie at this office after Jan. She will see [name] in March and Dr Lindi Adie with lab ~ May or June. She can certainly be seen at any time prior if she or other MDs request.   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 Post left total mastectomy 05-17-15, now on observation. As this DCIS may have been caused by mantle radiation in 1980s, she is at risk also in remaining right breast. Up to date on right mammogram at [facility] 03-15-16, dense breast tissue so needs 3D imaging. Dr Isaiah Blakes also following apparent fatty tissue below left mastectomy, [name] planned Feb. Cannot have breast MRI due to  pacemaker. 2.mild thrombocytopenia since ~ 06-2013, possibly medication related. No bleeding even on brilinta and ASA which are medically necessary due to cardiac disease. Spleen size normal by [name] 10-26-15. Stable now, follow 3.previous anemia related to intermittent slow GI bleeding, last iron studies good 09-2015, not anemic now. Colonoscopy 06-2012 no polyps, thought to have had ischemic colitis 2014. NOTE needs lasix if PRBCs. Decrease oral iron to 2-3x weekly (with multiple blood draws), consider DC this if continues to do well from standpoint of GI bleeding. Note colonoscopy and upper endoscopy by Dr Derrill Kay [phone] no esophageal varices and no colon polyps.  4. CAD s/p CABG in 12/05 and redo in 1/11, bioprosthetic AVR, diastolic CHF, and sick sinus syndrome s/p PCM. DES from left main into proximal LAD 03-2013; re-developed angina in 11/15 with DES to ostial LM. Last echo 11/15 EF 55-60%, normal bioprosthetic aortic valve, mild-moderate MR, moderate TR.On Brilinta and ASA as poor responder to plavix previously. Paroxysmal A fib evaluated at ED 05-28-15. Followed closely by [name]. Could not have the atrial appendage occlusion as device contained nickel 5.IIA nodular sclerosing Hodgkins treated with mantle radiation in 1980s, not recurrent. Radiation likely pertinent to hypothyroidism, CAD and breast cancer. 6.type II diabetes on insulin pump: followed by Dr Frederik Pear at [facility], on insulin pump. Diabetic gastroparesis. 7.Carotid stenosis: Carotid dopplers (2/15) with 40-59% bilateral stenosis. Carotid dopplers (3/16) with 40-59% bilateral ICA stenosis.  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 EGD 03-2016 no varices. No splenomegaly by [name] 10-2015 9..post right nephrectomy for renal cell ca 2008. She is followed yearly by Dr Lorrene Reid of nephrology and had recent follow up with CT by Dr Alinda Money 10. Hypothyroidism on replacement, also followed by Dr Frederik Pear. 11.osteopenia by bone density  scan [facility] 11-23-14 12.post TAH BSO 13.C diff and campylobacter GI infections spring 2-17 14.gout, on treatment 15.new elevations in AP, AST and ALT by chemistries at [facility] 07-26-16. Etiology not clear, other physicians to be notified and patient will need to follow up either with PCP, GI or other.  16.flu vaccine 04-2016   All questions answered and patient is in agreement with recommendations and plans at time of visit. We appreciate Dr Lindi Adie continuing medical oncology follow up for the remarkable lady.  Will let patient and other MDs know results of chemistries. Time spent 25 min including >50% counseling and coordination of care. Cc Drs Sharilyn Sites, Jerelyn Scott, Lindi Adie   Evlyn Clines, MD   07/26/2016, 10:08 AM

## 2016-07-26 NOTE — Telephone Encounter (Signed)
Refill sent to [facility].

## 2016-07-29 DIAGNOSIS — Z9012 Acquired absence of left breast and nipple: Secondary | ICD-10-CM | POA: Insufficient documentation

## 2016-07-30 ENCOUNTER — Telehealth: Payer: Self-pay

## 2016-07-30 NOTE — Telephone Encounter (Signed)
Discussed the lab results from 07-26-16 as noted below by Dr. Marko Plume. Routed a copy of labs to Dr. Shauna Hugh at [facility]. Ms Stillion will pick up a copy of her labs this afternoon.  Copy up frot for p/u.

## 2016-07-30 NOTE — Telephone Encounter (Signed)
-----   Message from [name], MD sent at 07/29/2016 12:56 PM EST ----- Regarding: labs 1-4 Please let her know that chemistries from 1-4 had some elevation in 3 liver chemistries. I do not think these are related to cancer, etiology not clear to me but I have sent the information to Drs Hilma Favors, Derrill Kay and Aundra Dubin.   She likely needs chemistries repeated by PCP or other in next couple of weeks, especially if still fatigued.  Thank you

## 2016-07-31 ENCOUNTER — Ambulatory Visit (HOSPITAL_COMMUNITY)
Admission: RE | Admit: 2016-07-31 | Discharge: 2016-07-31 | Disposition: A | Discharge status: Home / Self Care | Payer: Medicare Other | Source: Ambulatory Visit | Attending: Adult Health | Admitting: Adult Health

## 2016-07-31 DIAGNOSIS — I2510 Atherosclerotic heart disease of native coronary artery without angina pectoris: Secondary | ICD-10-CM | POA: Diagnosis not present

## 2016-07-31 DIAGNOSIS — Z9012 Acquired absence of left breast and nipple: Secondary | ICD-10-CM | POA: Insufficient documentation

## 2016-07-31 DIAGNOSIS — E119 Type 2 diabetes mellitus without complications: Secondary | ICD-10-CM | POA: Insufficient documentation

## 2016-07-31 DIAGNOSIS — I4892 Unspecified atrial flutter: Secondary | ICD-10-CM | POA: Diagnosis not present

## 2016-07-31 DIAGNOSIS — I712 Thoracic aortic aneurysm, without rupture: Secondary | ICD-10-CM | POA: Insufficient documentation

## 2016-07-31 DIAGNOSIS — I081 Rheumatic disorders of both mitral and tricuspid valves: Secondary | ICD-10-CM | POA: Diagnosis not present

## 2016-07-31 DIAGNOSIS — I110 Hypertensive heart disease with heart failure: Secondary | ICD-10-CM | POA: Insufficient documentation

## 2016-07-31 DIAGNOSIS — I4891 Unspecified atrial fibrillation: Secondary | ICD-10-CM | POA: Insufficient documentation

## 2016-07-31 DIAGNOSIS — I5022 Chronic systolic (congestive) heart failure: Secondary | ICD-10-CM | POA: Diagnosis not present

## 2016-07-31 DIAGNOSIS — E785 Hyperlipidemia, unspecified: Secondary | ICD-10-CM | POA: Diagnosis not present

## 2016-07-31 DIAGNOSIS — Z952 Presence of prosthetic heart valve: Secondary | ICD-10-CM | POA: Diagnosis not present

## 2016-07-31 LAB — ECHOCARDIOGRAM COMPLETE
AO mean calculated velocity dopler: 129 cm/s
AV Area mean vel: 0.82 cm2
AV Mean grad: 9 mmHg
AV area mean vel ind: 0.52 cm2/m2
AV peak Index: 0.56
AVAREAVTI: 0.89 cm2
AVAREAVTIIND: 0.5 cm2/m2
AVCELMEANRAT: 0.73
AVPG: 17 mmHg
AVPKVEL: 206 cm/s
Ao pk vel: 0.79 m/s
CHL CUP AV VEL: 0.79
CHL CUP MV DEC (S): 183
EWDT: 183 ms
FS: 38 % (ref 28–44)
IV/PV OW: 1.12
LA diam end sys: 39 mm
LA diam index: 2.46 cm/m2
LA vol A4C: 36.1 ml
LA vol index: 28.8 mL/m2
LASIZE: 39 mm
LAVOL: 45.6 mL
LDCA: 1.13 cm2
LV sys vol index: 8 mL/m2
LV sys vol: 12 mL — AB (ref 14–42)
LVOT SV: 38 mL
LVOT VTI: 33.4 cm
LVOT peak VTI: 0.7 cm
LVOT peak grad rest: 10 mmHg
LVOT peak vel: 162 cm/s
LVOTD: 12 mm
MVPG: 12 mmHg
MVPKEVEL: 173 m/s
PW: 9.83 mm — AB (ref 0.6–1.1)
RV sys press: 35 mmHg
Reg peak vel: 281 cm/s
TAPSE: 8.93 mm
TRMAXVEL: 281 cm/s
VTI: 47.8 cm
Valve area index: 0.5
Valve area: 0.79 cm2

## 2016-07-31 NOTE — Progress Notes (Signed)
*  PRELIMINARY RESULTS* Echocardiogram 2D Echocardiogram has been performed.  [name] [name] 07/31/2016, 10:32 AM

## 2016-08-07 ENCOUNTER — Encounter (HOSPITAL_COMMUNITY): Payer: Self-pay

## 2016-08-07 ENCOUNTER — Ambulatory Visit (INDEPENDENT_AMBULATORY_CARE_PROVIDER_SITE_OTHER): Payer: Medicare Other | Admitting: *Deleted

## 2016-08-07 ENCOUNTER — Ambulatory Visit (HOSPITAL_COMMUNITY)
Admission: RE | Admit: 2016-08-07 | Discharge: 2016-08-07 | Disposition: A | Discharge status: Home / Self Care | Payer: Medicare Other | Source: Ambulatory Visit | Attending: Cardiology | Admitting: Cardiology

## 2016-08-07 VITALS — BP 131/67 | HR 80 | Wt 126.5 lb

## 2016-08-07 DIAGNOSIS — Z9013 Acquired absence of bilateral breasts and nipples: Secondary | ICD-10-CM | POA: Insufficient documentation

## 2016-08-07 DIAGNOSIS — I480 Paroxysmal atrial fibrillation: Secondary | ICD-10-CM | POA: Diagnosis not present

## 2016-08-07 DIAGNOSIS — I495 Sick sinus syndrome: Secondary | ICD-10-CM | POA: Diagnosis not present

## 2016-08-07 DIAGNOSIS — Z953 Presence of xenogenic heart valve: Secondary | ICD-10-CM | POA: Diagnosis not present

## 2016-08-07 DIAGNOSIS — Z8571 Personal history of Hodgkin lymphoma: Secondary | ICD-10-CM | POA: Diagnosis not present

## 2016-08-07 DIAGNOSIS — I350 Nonrheumatic aortic (valve) stenosis: Secondary | ICD-10-CM

## 2016-08-07 DIAGNOSIS — Z8249 Family history of ischemic heart disease and other diseases of the circulatory system: Secondary | ICD-10-CM | POA: Diagnosis not present

## 2016-08-07 DIAGNOSIS — I6523 Occlusion and stenosis of bilateral carotid arteries: Secondary | ICD-10-CM | POA: Diagnosis not present

## 2016-08-07 DIAGNOSIS — M109 Gout, unspecified: Secondary | ICD-10-CM | POA: Diagnosis not present

## 2016-08-07 DIAGNOSIS — D509 Iron deficiency anemia, unspecified: Secondary | ICD-10-CM | POA: Diagnosis not present

## 2016-08-07 DIAGNOSIS — Z91048 Other nonmedicinal substance allergy status: Secondary | ICD-10-CM | POA: Diagnosis not present

## 2016-08-07 DIAGNOSIS — Z905 Acquired absence of kidney: Secondary | ICD-10-CM | POA: Insufficient documentation

## 2016-08-07 DIAGNOSIS — I25110 Atherosclerotic heart disease of native coronary artery with unstable angina pectoris: Secondary | ICD-10-CM | POA: Insufficient documentation

## 2016-08-07 DIAGNOSIS — N189 Chronic kidney disease, unspecified: Secondary | ICD-10-CM | POA: Insufficient documentation

## 2016-08-07 DIAGNOSIS — Z8711 Personal history of peptic ulcer disease: Secondary | ICD-10-CM | POA: Insufficient documentation

## 2016-08-07 DIAGNOSIS — E1122 Type 2 diabetes mellitus with diabetic chronic kidney disease: Secondary | ICD-10-CM | POA: Diagnosis not present

## 2016-08-07 DIAGNOSIS — E7800 Pure hypercholesterolemia, unspecified: Secondary | ICD-10-CM | POA: Diagnosis not present

## 2016-08-07 DIAGNOSIS — E785 Hyperlipidemia, unspecified: Secondary | ICD-10-CM | POA: Diagnosis not present

## 2016-08-07 DIAGNOSIS — Z7982 Long term (current) use of aspirin: Secondary | ICD-10-CM | POA: Insufficient documentation

## 2016-08-07 DIAGNOSIS — I5032 Chronic diastolic (congestive) heart failure: Secondary | ICD-10-CM | POA: Insufficient documentation

## 2016-08-07 DIAGNOSIS — Z85528 Personal history of other malignant neoplasm of kidney: Secondary | ICD-10-CM | POA: Diagnosis not present

## 2016-08-07 DIAGNOSIS — Z951 Presence of aortocoronary bypass graft: Secondary | ICD-10-CM | POA: Diagnosis not present

## 2016-08-07 DIAGNOSIS — E1143 Type 2 diabetes mellitus with diabetic autonomic (poly)neuropathy: Secondary | ICD-10-CM | POA: Insufficient documentation

## 2016-08-07 DIAGNOSIS — I2510 Atherosclerotic heart disease of native coronary artery without angina pectoris: Secondary | ICD-10-CM | POA: Diagnosis present

## 2016-08-07 DIAGNOSIS — Z950 Presence of cardiac pacemaker: Secondary | ICD-10-CM | POA: Insufficient documentation

## 2016-08-07 LAB — BASIC METABOLIC PANEL
Anion gap: 11 (ref 5–15)
BUN: 24 mg/dL — ABNORMAL HIGH (ref 6–20)
CHLORIDE: 101 mmol/L (ref 101–111)
CO2: 29 mmol/L (ref 22–32)
CREATININE: 1.12 mg/dL — AB (ref 0.44–1.00)
Calcium: 10.3 mg/dL (ref 8.9–10.3)
GFR calc non Af Amer: 51 mL/min — ABNORMAL LOW (ref >60)
GFR, EST AFRICAN AMERICAN: 59 mL/min — AB (ref >60)
Glucose, Bld: 167 mg/dL — ABNORMAL HIGH (ref 65–99)
POTASSIUM: 4.2 mmol/L (ref 3.5–5.1)
Sodium: 141 mmol/L (ref 135–145)

## 2016-08-07 LAB — MAGNESIUM: Magnesium: 2.5 mg/dL — ABNORMAL HIGH (ref 1.7–2.4)

## 2016-08-07 MED ORDER — POTASSIUM CHLORIDE CRYS ER 10 MEQ PO TBCR: 60.0000 meq | EXTENDED_RELEASE_TABLET | Freq: Two times a day (BID) | ORAL | 3 refills | Status: DC

## 2016-08-07 MED ORDER — TORSEMIDE 20 MG PO TABS: ORAL_TABLET | ORAL | 3 refills | Status: DC

## 2016-08-07 NOTE — Patient Instructions (Signed)
Increase Torsemide to 40 mg Twice daily FOR 5 DAYS ONLY, then take 40 mg in AM and 20 mg in PM  Increase Potassium to 60 meq Twice daily   Labs today  Labs in 10 days  Your physician recommends that you schedule a follow-up appointment in: 3 weeks

## 2016-08-07 NOTE — Progress Notes (Signed)
Remote pacemaker transmission.   

## 2016-08-09 NOTE — Progress Notes (Signed)
Patient ID: [name] [name], female   DOB: 1950/11/27, 66 y.o.   MRN: [id number] PCP: Dr. Hilma Favors Oncologist: Dr Marko Plume Cardiology: Dr. Aundra Dubin  66 yo with history of CAD s/p CABG in 12/05 and redo in 1/11, bioprosthetic AVR, diastolic CHF, and sick sinus syndrome s/p PCM presents for cardiology followup.  She had initial CABG with SVG-RCA in 12/05 followed by redo SVG-RCA and bioprosthetic AVR in 1/11.  She has had diastolic CHF and is on Lasix.  In 9/14, she had a CPX test.  This showed severe functional limitation with ischemic ECG changes (inferolateral ST depression and ST elevation in V1 and V2). She had chest pain.  She had R/LHC in 9/14.  This showed elevated left and right heart filling pressures, patent SVG-PDA, and 80% distal LM/80% ostial LAD stenosis.  She was not a candidate for redo CABG (had 2 prior sternotomies as well as chest radiation for Hodgkins lymphoma).  She had DES from left main into proximal LAD and will need long-term DAPT => Brilinta was used as she has been a poor responder to Plavix in the past.  She re-developed angina in 11/15 and was admitted with unstable angina.  LHC showed 80% ostial LM and patent SVG-RCA. She had DES to ostial LM. Last echo in 1/18 showed EF 60-65%, normal bioprosthetic aortic valve, mild-moderate MR, PASP 35 mmHg.   She has chronic iron deficiency anemia thought to be due to a slow GI bleed.  Last EGD was unremarkable and a capsule endoscopy was also unremarkable.  She has had periodic transfusions; However, recently hemoglobin has been stable.    She had bilateral mastectomy for DCIS in 57/84 without complication.  She has an insulin pump followed by an endocrinologist at [facility].   Given myalgias with statins, she was started on Praluent.  LDL is now excellent.    In the past she has had short runs of atrial fibrillation.  No prolonged episodes (beyond an hour or two) that she has felt.  She is not anticoagulated.  Planned for Watchman placement,  but given her nickel allergy, we were unable to place a Watchman.   Recently, she feels like she has been retaining more fluid. Weight is up 5 lbs.  Increased LE edema despite transition to torsemide.  She is short of breath with hills and inclines.  Short of breath walking about 1 block on flat ground.  No chest pain.  She has orthopnea.    Labs (7/14): hemoglobin 11 Labs (9/14): K 3.9, creatinine 1.48, hemoglobin 7.3 Labs (10/14): hemoglobin 7.7 Labs (11/14): K 3.8, creatinine 1.5 Labs (12/14): HCT 37.7 Labs (1/15): LDL 106, HDL 41, K 3.9, creatinine 1.6, LFTs normal Labs (2/15): K 3.7, creatinine 1.1, BNP 71 Labs (3/15): HCT 36.7 Labs (4/15): K 3.7, creatinine 1.1, HCT 35.5 Labs (7/15): HCT 36.4 Labs (11/15): HCT 32.1, K 4.2, creatinine 1.06 Labs (12/15): hgb 12 Labs (1/16): HCT 37.1 Labs (3/16): LDL 25, HDL 42, elevated transaminases Labs (6/16): HCT 39.6, plts 146 Labs (7/16): LDL 59, HDL 41, AST 34, ALT 49, AP 125 Labs (11/16): HCT 38.8, alkaline phosphatase 156, AST/ALT normal, K 4, creatinine 1.0 Labs (3/17): K 4.4, creatinine 1.4, LFTs normal, hgb 12.5 Labs (5/17): HCT 41.8, K 4, creatinine 1.3 Labs (9/17): K 4, creatinine 0.43, hgb 13.6 Labs (10/17): LDL 42, HDL 43 Labs (1/18): K 4.3, creatinine 1.1, AST 66, ALT 67, hgb 14.2, BNP 72  PMH: 1. Sick sinus syndrome s/p [employer] PCM.  2. HTN 3. H/o  SBO 4. Renal cell carcinoma s/p right nephrectomy in 2008.  5. 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  Fibroscan in 11/14 showed advanced fibrosis concerning for cirrhosis. EGD in 11/14 showed no varices.  6. Diastolic CHF: Echo (9/92) with EF 60-65%, bioprosthetic aortic valve with mean gradient 15 mmHg, mild MR.  CPX (9/14): peak VO2 10.4, VE/VCO2 43.8, inferolateral ST depression and ST elevation in V1/V2 with severe dyspnea and chest pain, severe functional limitation with ischemic changes.  Echo (9/14) with EF 60-65%, normal RV size and systolic function, mild-moderate MR,  bioprosthetic aortic valve normal, PA systolic pressure 51 mmHg, no evidence for pericardial constriction though exam incomplete for constriction.  Echo (11/15) with EF 55-60%, bioprosthetic aortic valve, mild-moderate MR, moderate TR, PA systolic pressure 42 mmHg.  - TEE (5/17) with EF 60-65%, normal bioprosthetic aortic valve with mean gradient 13 mmHg, normal RV size and systolic function. - Echo (1/18) with EF 60-65%, normal bioprosthetic aortic valve, mild-moderate MR, PASP 35 mmHg.  7. TAH-BSO 8. Type II diabetes: Has insulin pump.  9. H/o ischemic colitis. 10. H/o PUD. 11. Diabetic gastroparesis. 12. Hyperlipidemia: Myalgias with Crestor and atorvastatin, elevated LFTs with simvastatin.  Myalgias with > 10 mg pravastatin.  13. CKD 14. Bicuspid aortic valve s/p 19 mm Edwards pericardial valve in 1/11 (had post-operative Dresslers syndrome).   15. CAD: CABG with SVG-RCA in 12/05.  Redo SVG-RCA in 1/11 with AVR.  LHC/RHC (9/14) with mean RA 18, PA 62/25 mean 43, mean PCWP 26, CI 2.8, 70-80% distal LM stenosis, 80% ostial LAD stenosis, total occlusion RCA, SVG-PDA patent.  Patient had DES LM into LAD.  Plan to continue long-term ASA/Brilinta (poor responder to Plavix).  ETT-cardiolite (4/15) with 6' exercise, EF 60%, ST depression in recovery and mild chest pain, no ischemia or infarction by perfusion images.  Unstable angina 11/15 with 80% LM, patent SVG-RCA => DES to ostial LM.   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  17. Anemia: Iron-deficiency.  Suspect chronic GI blood loss.  EGD in 9/14 was unremarkable.  Capsule endoscopy 10/14 was unremarkable.  Has required periodic blood transfusions.  18. Atrial fibrillation: Paroxysmal, noted by [facility] interrogation.  19. Carotid stenosis: Carotid dopplers (2/15) with 40-59% bilateral stenosis. Carotid dopplers (3/16) with 40-59% bilateral ICA stenosis. Carotid dopplers (3/17) with 40-59% bilateral ICA stenosis.   20. 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  22. C difficile colitis 2017 23. Gout  SH: Married, lives in [place], nonsmoker.   FH: CAD  ROS: All systems reviewed and negative except as per HPI.   Current Outpatient Prescriptions  Medication Sig Dispense Refill  . acyclovir cream (ZOVIRAX) 5 % apply to affected areas once daily needed for rash    . ALPRAZolam (XANAX) 0.5 MG tablet Take 0.5 mg by mouth at bedtime. May take additional dose daily for anxiety    . amLODipine (NORVASC) 5 MG tablet Take 1 tablet (5 mg total) by mouth daily. 90 tablet 3  . APIDRA 100 UNIT/ML injection INFUSE THROUGH INSULIN PUMP UTD. Has been receiving 82-125 units daily over last 10 days.  Asked pt to bring with her on admission 10/25  2  . aspirin EC 81 MG tablet Take 81 mg by mouth at bedtime.    Marland Kitchen BRILINTA 60 MG TABS tablet TAKE 1 TABLET(60 MG) BY MOUTH TWICE DAILY 180 tablet 2  . Calcium  Carbonate-Vitamin D (RA CALCIUM PLUS VITAMIN D) 600-400 MG-UNIT per tablet Take 1 tablet by mouth daily.     . Cholecalciferol 2000 UNITS TABS Take 1 tablet by mouth daily.    . clobetasol (TEMOVATE) 0.05 % external solution Apply 1 application topically daily as needed (for hives). Reported on 12/05/2015    . colchicine 0.6 MG tablet Take 0.6 mg by mouth daily. And,Take 1 tablet at onset and repeate in 1 hour and then 1 daily x 2 (FOR FLARES)    . Fe Fum-FA-B Cmp-C-Zn-Mg-Mn-Cu (HEMATINIC PLUS COMPLEX) 106-1 MG TABS Take 1 tablet oraly three times a week or as directed daily with with a Vitamin C tablet 60 each 0  . glucagon (GLUCAGON EMERGENCY) 1 MG injection Inject 1 mg into the vein once as needed (for severe reaction). Reported on 12/05/2015    . isosorbide mononitrate (IMDUR) 60 MG 24 hr tablet TAKE 1 TABLET BY MOUTH ONCE DAILY 90 tablet 3  . Magnesium Oxide 250 MG TABS Take 250 mg by mouth 2  (two) times daily.    . metoprolol (LOPRESSOR) 50 MG tablet TAKE 1 AND 1/2 TABLETS(75 MG) BY MOUTH TWICE DAILY 270 tablet 3  . Multiple Vitamins-Minerals (MULTIVITAMIN WITH MINERALS) tablet Take 1 tablet by mouth daily.      . Omega-3 Fatty Acids (FISH OIL) 1000 MG CAPS Take by mouth.    Marland Kitchen omeprazole (PRILOSEC) 20 MG capsule Take 20 mg by mouth every other day.     . ONE TOUCH ULTRA TEST test strip 1 each by Other route as needed for other.     . potassium chloride (K-DUR,KLOR-CON) 10 MEQ tablet Take 6 tablets (60 mEq total) by mouth 2 (two) times daily. 240 tablet 3  . pravastatin (PRAVACHOL) 10 MG tablet TAKE 1 TABLET BY MOUTH ONCE DAILY 90 tablet 3  . Probiotic Product (ALIGN) 4 MG CAPS Take by mouth.    . ranitidine (ZANTAC) 150 MG tablet Take 150 mg by mouth every other day. With omeprozole    . torsemide (DEMADEX) 20 MG tablet Take 2 tabs in AM and 1 tab in PM 90 tablet 3  . ULORIC 40 MG tablet Take 40 mg by mouth daily.  11  . vitamin C (ASCORBIC ACID) 250 MG tablet Take 250 mg by mouth 2 (two) times daily.    Marland Kitchen azithromycin (ZITHROMAX) 500 MG tablet 1 tablet by mouth one hour prior to dental appointment/procedure (Patient not taking: Reported on 08/07/2016) 2 tablet 0  . nitroGLYCERIN (NITROLINGUAL) 0.4 MG/SPRAY spray Place 1 spray under the tongue every 5 (five) minutes as needed for chest pain. (Patient not taking: Reported on 08/07/2016) 12 g 1   No current facility-administered medications for this encounter.     BP 131/67   Pulse 80   Wt 126 lb 8 oz (57.4 kg)   SpO2 100%   BMI 22.59 kg/m  General: NAD. Ambulated in the clinic with her husband.  Neck: JVP 10 cm, no thyromegaly or thyroid nodule.  Lungs: Clear CV: Nondisplaced PMI.  Heart regular S1/S2, no S3/S4, 2/6 early systolic murmur RUSB.  1+ ankle edema.  Left>right carotid bruit.  2+ PT pulses bilaterally.  Abdomen: Soft, nontender, no hepatosplenomegaly, no distention.  Skin: Intact without lesions or rashes.   Neurologic: Alert and oriented x 3.  Psych: Normal affect. Extremities: No clubbing or cyanosis.   Assessment/Plan: 1. CAD: s/p SVG-RCA in 12/05, then redo SVG-RCA in 1/11--> distal left main/proximal LAD severe stenosis in 9/14.  PCI with DES to distal LM/proximal LAD was done rather than bypass as she would have been a poor candidate for redo sternotomy (2 prior sternotomies and history of chest wall radiation for Hodgkins disease).  Recurrent unstable angina with 80% ostial LM treated with DES in 11/15.  No chest pain.  - Continue Brilinta 60 mg bid (poor responder to plavix) long-term given recurrent left main disease.  This has been complicated by suspected slow GI bleed. Recent hemoglobin was normal. - Continue ASA 81, statin, metoprolol, Imdur at the current doses.     2. Bioprosthetic aortic valve replacement: In setting of bicuspid aortic valve, thoracic aorta not dilated on 2017 TEE.  Stable on 1/18 echo.   3. Chronic diastolic CHF: Recent echo unchanged compared to the past.  She is volume overloaded on exam with NYHA class III symptoms despite transition to torsemide.  Weight is up.   - Increase torsemide to 40 mg bid x 5 days, then decrease to 40 qam/20 qpm.  Increase KCl to 60 bid.  BMET today and repeat in 10 days.  - Wear graded compression stockings.  4. CKD:  Patient has a single kidney s/p right nephrectomy.  She sees nephrology. Watch closely with increased diuretics.  5. Hyperlipidemia: She has not been able to tolerate any higher potency statin than pravastatin 10 mg daily.   - Continue Praluent, good lipids when last checked.       6. Anemia: Suspected slow GI bleed.  Unremarkable EGD and capsule endoscopy in fall 2014.  Unfortunately, needs to stay on Brilinta long-term (Plavix non-responder).  Will need to follow closely (sees hematology), plan for transfusion with hemoglobin < 8 as this allows best control of her symptoms. Recently, hemoglobin has been more stable.   7.  Atrial fibrillation: Paroxysmal, brief episodes noted on PCM interrogation.  Given GI bleeding and need to be on Brilinta, risks likely outweigh benefits of anticoagulation.  She is in NSR.  - Ongoing brief atrial fibrillation episodes. She is not a good candidate for anticoagulants due to to GI bleed.  She is not a good Multaq candidate with CHF.  If she needs an antiarrhythmic, consider Tikosyn or Sotalol.  - We were unable to place Watchman device due to nickel allergy.   8. Carotid bruit: Mild to moderate bilateral stenosis.  Repeat carotid US in 3/18. 9. SSS: Pacemaker in place.  10. HTN: controlled. Continue current regimen.    Followup in 3 wks.   [name] 08/09/2016 11:04 AM

## 2016-08-11 LAB — CUP PACEART REMOTE DEVICE CHECK
Battery Voltage: 2.81 V
Brady Statistic AP VS Percent: 0 %
Brady Statistic AS VP Percent: 0 %
Brady Statistic AS VS Percent: 100 %
Date Time Interrogation Session: 20180116123631
Implantable Lead Implant Date: 20140130
Implantable Lead Implant Date: 20140130
Implantable Lead Location: 753859
Lead Channel Impedance Value: 466 Ohm
Lead Channel Impedance Value: 642 Ohm
Lead Channel Pacing Threshold Amplitude: 0.625 V
Lead Channel Pacing Threshold Pulse Width: 0.4 ms
Lead Channel Sensing Intrinsic Amplitude: 16 mV
Lead Channel Setting Sensing Sensitivity: 5.6 mV
MDC IDC LEAD LOCATION: 753860
MDC IDC MSMT BATTERY IMPEDANCE: 185 Ohm
MDC IDC MSMT BATTERY REMAINING LONGEVITY: 150 mo
MDC IDC MSMT LEADCHNL RA PACING THRESHOLD AMPLITUDE: 0.875 V
MDC IDC MSMT LEADCHNL RA SENSING INTR AMPL: 1 mV
MDC IDC MSMT LEADCHNL RV PACING THRESHOLD PULSEWIDTH: 0.4 ms
MDC IDC PG IMPLANT DT: 20140130
MDC IDC SET LEADCHNL RA PACING AMPLITUDE: 1.75 V
MDC IDC SET LEADCHNL RV PACING AMPLITUDE: 2.5 V
MDC IDC SET LEADCHNL RV PACING PULSEWIDTH: 0.4 ms
MDC IDC STAT BRADY AP VP PERCENT: 0 %

## 2016-08-13 ENCOUNTER — Telehealth (HOSPITAL_COMMUNITY): Payer: Self-pay | Admitting: *Deleted

## 2016-08-13 NOTE — Telephone Encounter (Signed)
Notes Recorded by Kennieth Rad, RN on 08/13/2016 at 4:08 PM EST Patient returned call and she is agreeable to decreasing her Mag to Once a day and will recheck on her next appointment in February with [name]. ------  Notes Recorded by Kennieth Rad, RN on 08/13/2016 at 3:55 PM EST Patient called triage line, called her back but left VM. ------  Notes Recorded by Scarlette Calico, RN on 08/13/2016 at 12:48 PM EST Left message to call back ------  Notes Recorded by Harvie Junior, CMA on 08/10/2016 at 2:15 PM EST Left vm for pt to call back for lab results. ------  Notes Recorded by Larey Dresser, MD on 08/09/2016 at 11:07 AM EST Decrease magnesium to once a day, repeat Mg level with next labs.

## 2016-08-15 ENCOUNTER — Encounter: Payer: Self-pay | Admitting: Cardiology

## 2016-08-17 ENCOUNTER — Other Ambulatory Visit: Payer: Self-pay | Admitting: Cardiology

## 2016-08-18 LAB — BASIC METABOLIC PANEL
BUN/Creatinine Ratio: 26 (ref 12–28)
BUN: 31 mg/dL — ABNORMAL HIGH (ref 8–27)
CALCIUM: 10.1 mg/dL (ref 8.7–10.3)
CO2: 26 mmol/L (ref 18–29)
Chloride: 101 mmol/L (ref 96–106)
Creatinine, Ser: 1.19 mg/dL — ABNORMAL HIGH (ref 0.57–1.00)
GFR, EST AFRICAN AMERICAN: 56 mL/min/{1.73_m2} — AB (ref >59)
GFR, EST NON AFRICAN AMERICAN: 49 mL/min/{1.73_m2} — AB (ref >59)
Glucose: 161 mg/dL — ABNORMAL HIGH (ref 65–99)
POTASSIUM: 4.2 mmol/L (ref 3.5–5.2)
SODIUM: 144 mmol/L (ref 134–144)

## 2016-08-21 ENCOUNTER — Telehealth (HOSPITAL_COMMUNITY): Payer: Self-pay | Admitting: *Deleted

## 2016-08-21 MED ORDER — TORSEMIDE 20 MG PO TABS: 40.0000 mg | ORAL_TABLET | Freq: Two times a day (BID) | ORAL | 3 refills | Status: DC

## 2016-08-21 NOTE — Telephone Encounter (Signed)
Patient called in complaining of continuing symptoms of shortness of breath with minimal activity, increase of swelling in BLE, trouble laying down to sleep at night, and overall weight gain of 4 pounds in the last week.  Spoke with Dr. Aundra Dubin and he advises patient to increase Torsemide to 40 mg (2 Tablets) Two times daily and come in for an office visit this Friday.  Patient is agreeable with plan, medications updated in epic and I have made her an appointment in clinic this Friday. No further questions at this time.

## 2016-08-24 ENCOUNTER — Telehealth (HOSPITAL_COMMUNITY): Payer: Self-pay | Admitting: *Deleted

## 2016-08-24 ENCOUNTER — Encounter (HOSPITAL_COMMUNITY): Payer: Self-pay

## 2016-08-24 ENCOUNTER — Ambulatory Visit (HOSPITAL_COMMUNITY)
Admission: RE | Admit: 2016-08-24 | Discharge: 2016-08-24 | Disposition: A | Discharge status: Home / Self Care | Payer: Medicare Other | Source: Ambulatory Visit | Attending: Cardiology | Admitting: Cardiology

## 2016-08-24 VITALS — BP 122/63 | HR 83 | Wt 118.2 lb

## 2016-08-24 DIAGNOSIS — D649 Anemia, unspecified: Secondary | ICD-10-CM | POA: Diagnosis not present

## 2016-08-24 DIAGNOSIS — I2510 Atherosclerotic heart disease of native coronary artery without angina pectoris: Secondary | ICD-10-CM | POA: Insufficient documentation

## 2016-08-24 DIAGNOSIS — Z951 Presence of aortocoronary bypass graft: Secondary | ICD-10-CM | POA: Diagnosis not present

## 2016-08-24 DIAGNOSIS — M791 Myalgia: Secondary | ICD-10-CM | POA: Diagnosis not present

## 2016-08-24 DIAGNOSIS — I110 Hypertensive heart disease with heart failure: Secondary | ICD-10-CM | POA: Diagnosis not present

## 2016-08-24 DIAGNOSIS — R0609 Other forms of dyspnea: Secondary | ICD-10-CM

## 2016-08-24 DIAGNOSIS — Z7982 Long term (current) use of aspirin: Secondary | ICD-10-CM | POA: Diagnosis not present

## 2016-08-24 DIAGNOSIS — I480 Paroxysmal atrial fibrillation: Secondary | ICD-10-CM

## 2016-08-24 DIAGNOSIS — E1143 Type 2 diabetes mellitus with diabetic autonomic (poly)neuropathy: Secondary | ICD-10-CM | POA: Diagnosis not present

## 2016-08-24 DIAGNOSIS — Z905 Acquired absence of kidney: Secondary | ICD-10-CM | POA: Insufficient documentation

## 2016-08-24 DIAGNOSIS — I5032 Chronic diastolic (congestive) heart failure: Secondary | ICD-10-CM

## 2016-08-24 DIAGNOSIS — Z853 Personal history of malignant neoplasm of breast: Secondary | ICD-10-CM | POA: Diagnosis not present

## 2016-08-24 DIAGNOSIS — I6523 Occlusion and stenosis of bilateral carotid arteries: Secondary | ICD-10-CM | POA: Diagnosis not present

## 2016-08-24 DIAGNOSIS — M109 Gout, unspecified: Secondary | ICD-10-CM | POA: Diagnosis not present

## 2016-08-24 DIAGNOSIS — I25110 Atherosclerotic heart disease of native coronary artery with unstable angina pectoris: Secondary | ICD-10-CM

## 2016-08-24 DIAGNOSIS — Z952 Presence of prosthetic heart valve: Secondary | ICD-10-CM | POA: Diagnosis not present

## 2016-08-24 DIAGNOSIS — I350 Nonrheumatic aortic (valve) stenosis: Secondary | ICD-10-CM

## 2016-08-24 DIAGNOSIS — I495 Sick sinus syndrome: Secondary | ICD-10-CM | POA: Diagnosis not present

## 2016-08-24 DIAGNOSIS — D509 Iron deficiency anemia, unspecified: Secondary | ICD-10-CM | POA: Insufficient documentation

## 2016-08-24 DIAGNOSIS — Z8571 Personal history of Hodgkin lymphoma: Secondary | ICD-10-CM | POA: Diagnosis not present

## 2016-08-24 DIAGNOSIS — Z85528 Personal history of other malignant neoplasm of kidney: Secondary | ICD-10-CM | POA: Diagnosis not present

## 2016-08-24 DIAGNOSIS — E785 Hyperlipidemia, unspecified: Secondary | ICD-10-CM | POA: Insufficient documentation

## 2016-08-24 DIAGNOSIS — K3184 Gastroparesis: Secondary | ICD-10-CM | POA: Insufficient documentation

## 2016-08-24 DIAGNOSIS — Z950 Presence of cardiac pacemaker: Secondary | ICD-10-CM | POA: Insufficient documentation

## 2016-08-24 DIAGNOSIS — M79604 Pain in right leg: Secondary | ICD-10-CM | POA: Diagnosis not present

## 2016-08-24 DIAGNOSIS — Z9011 Acquired absence of right breast and nipple: Secondary | ICD-10-CM | POA: Diagnosis not present

## 2016-08-24 DIAGNOSIS — I4891 Unspecified atrial fibrillation: Secondary | ICD-10-CM | POA: Insufficient documentation

## 2016-08-24 DIAGNOSIS — Z9012 Acquired absence of left breast and nipple: Secondary | ICD-10-CM | POA: Diagnosis not present

## 2016-08-24 LAB — BASIC METABOLIC PANEL
ANION GAP: 12 (ref 5–15)
BUN: 43 mg/dL — ABNORMAL HIGH (ref 6–20)
CALCIUM: 10.5 mg/dL — AB (ref 8.9–10.3)
CHLORIDE: 104 mmol/L (ref 101–111)
CO2: 26 mmol/L (ref 22–32)
Creatinine, Ser: 1.21 mg/dL — ABNORMAL HIGH (ref 0.44–1.00)
GFR calc non Af Amer: 47 mL/min — ABNORMAL LOW (ref >60)
GFR, EST AFRICAN AMERICAN: 54 mL/min — AB (ref >60)
GLUCOSE: 168 mg/dL — AB (ref 65–99)
POTASSIUM: 3.9 mmol/L (ref 3.5–5.1)
Sodium: 142 mmol/L (ref 135–145)

## 2016-08-24 LAB — CBC
HEMATOCRIT: 42.1 % (ref 36.0–46.0)
HEMOGLOBIN: 13.8 g/dL (ref 12.0–15.0)
MCH: 29.3 pg (ref 26.0–34.0)
MCHC: 32.8 g/dL (ref 30.0–36.0)
MCV: 89.4 fL (ref 78.0–100.0)
Platelets: 142 10*3/uL — ABNORMAL LOW (ref 150–400)
RBC: 4.71 MIL/uL (ref 3.87–5.11)
RDW: 13.5 % (ref 11.5–15.5)
WBC: 7.8 10*3/uL (ref 4.0–10.5)

## 2016-08-24 LAB — BRAIN NATRIURETIC PEPTIDE: B NATRIURETIC PEPTIDE 5: 49.6 pg/mL (ref 0.0–100.0)

## 2016-08-24 MED ORDER — TORSEMIDE 20 MG PO TABS: 40.0000 mg | ORAL_TABLET | Freq: Two times a day (BID) | ORAL | 3 refills | Status: DC

## 2016-08-24 NOTE — Patient Instructions (Signed)
Labs today  Your physician has requested that you have en exercise stress myoview. For further information please visit [email or web address]. Please follow instruction sheet, as given.  Your physician recommends that you schedule a follow-up appointment in: 3 weeks

## 2016-08-24 NOTE — Telephone Encounter (Signed)
Patient given detailed instructions per Myocardial Perfusion Study Information Sheet for the test on 08/27/16 at 7:30. Patient notified to arrive 15 minutes early and that it is imperative to arrive on time for appointment to keep from having the test rescheduled.  If you need to cancel or reschedule your appointment, please call the office within 24 hours of your appointment. Failure to do so may result in a cancellation of your appointment, and a $50 no show fee. Patient verbalized understanding.[name] [name]

## 2016-08-26 NOTE — Progress Notes (Signed)
Patient ID: [name] [name], female   DOB: 1951/07/07, 66 y.o.   MRN: [id number] PCP: Dr. Hilma Favors Oncologist: Dr Marko Plume Cardiology: Dr. Aundra Dubin  66 yo with history of CAD s/p CABG in 12/05 and redo in 1/11, bioprosthetic AVR, diastolic CHF, and sick sinus syndrome s/p PCM presents for cardiology followup.  She had initial CABG with SVG-RCA in 12/05 followed by redo SVG-RCA and bioprosthetic AVR in 1/11.  She has had diastolic CHF and is on Lasix.  In 9/14, she had a CPX test.  This showed severe functional limitation with ischemic ECG changes (inferolateral ST depression and ST elevation in V1 and V2). She had chest pain.  She had R/LHC in 9/14.  This showed elevated left and right heart filling pressures, patent SVG-PDA, and 80% distal LM/80% ostial LAD stenosis.  She was not a candidate for redo CABG (had 2 prior sternotomies as well as chest radiation for Hodgkins lymphoma).  She had DES from left main into proximal LAD and will need long-term DAPT => Brilinta was used as she has been a poor responder to Plavix in the past.  She re-developed angina in 11/15 and was admitted with unstable angina.  LHC showed 80% ostial LM and patent SVG-RCA. She had DES to ostial LM. Last echo in 1/18 showed EF 60-65%, normal bioprosthetic aortic valve, mild-moderate MR, PASP 35 mmHg.   She has chronic iron deficiency anemia thought to be due to a slow GI bleed.  Last EGD was unremarkable and a capsule endoscopy was also unremarkable.  She has had periodic transfusions; However, recently hemoglobin has been stable.    She had bilateral mastectomy for DCIS in 15/17 without complication.  She has an insulin pump followed by an endocrinologist at [facility].   Given myalgias with statins, she was started on Praluent.  LDL is now excellent.    In the past she has had short runs of atrial fibrillation.  She is not anticoagulated.  Planned for Watchman placement, but given her nickel allergy, we were unable to place a Watchman.   She felt some palpitations earlier this week, suspects atrial fibrillation.  Today, she is in NSR.   At last appointment, given dyspnea and weight gain, we increased her diuretic.  Her weight is down 8 lbs, but she is still short of breath with exertion.  This really has been ongoing since before Christmas.  She is dyspneic walking 40-50 feet, which is unusual for her.  She continues to have orthopnea.  No chest pain, rare episodes of lightheadedness.  Of note, she had dyspnea as an anginal equivalent in the past.    ECG: NSR, 1st degree AVB, LVH  Labs (7/14): hemoglobin 11 Labs (9/14): K 3.9, creatinine 1.48, hemoglobin 7.3 Labs (10/14): hemoglobin 7.7 Labs (11/14): K 3.8, creatinine 1.5 Labs (12/14): HCT 37.7 Labs (1/15): LDL 106, HDL 41, K 3.9, creatinine 1.6, LFTs normal Labs (2/15): K 3.7, creatinine 1.1, BNP 71 Labs (3/15): HCT 36.7 Labs (4/15): K 3.7, creatinine 1.1, HCT 35.5 Labs (7/15): HCT 36.4 Labs (11/15): HCT 32.1, K 4.2, creatinine 1.06 Labs (12/15): hgb 12 Labs (1/16): HCT 37.1 Labs (3/16): LDL 25, HDL 42, elevated transaminases Labs (6/16): HCT 39.6, plts 146 Labs (7/16): LDL 59, HDL 41, AST 34, ALT 49, AP 125 Labs (11/16): HCT 38.8, alkaline phosphatase 156, AST/ALT normal, K 4, creatinine 1.0 Labs (3/17): K 4.4, creatinine 1.4, LFTs normal, hgb 12.5 Labs (5/17): HCT 41.8, K 4, creatinine 1.3 Labs (9/17): K 4, creatinine 0.43, hgb  13.6 Labs (10/17): LDL 42, HDL 43 Labs (1/18): K 4.3, creatinine 1.1 => 1.19, AST 66, ALT 67, hgb 14.2, BNP 72  PMH: 1. Sick sinus syndrome s/p [employer] PCM.  2. HTN 3. H/o SBO 4. Renal cell carcinoma s/p right nephrectomy in 2008.  5. 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  Fibroscan in 11/14 showed advanced fibrosis concerning for cirrhosis. EGD in 11/14 showed no varices.  6. Diastolic CHF: Echo (3/41) with EF 60-65%, bioprosthetic aortic valve with mean gradient 15 mmHg, mild MR.  CPX (9/14): peak VO2 10.4, VE/VCO2 43.8,  inferolateral ST depression and ST elevation in V1/V2 with severe dyspnea and chest pain, severe functional limitation with ischemic changes.  Echo (9/14) with EF 60-65%, normal RV size and systolic function, mild-moderate MR, bioprosthetic aortic valve normal, PA systolic pressure 51 mmHg, no evidence for pericardial constriction though exam incomplete for constriction.  Echo (11/15) with EF 55-60%, bioprosthetic aortic valve, mild-moderate MR, moderate TR, PA systolic pressure 42 mmHg.  - TEE (5/17) with EF 60-65%, normal bioprosthetic aortic valve with mean gradient 13 mmHg, normal RV size and systolic function. - Echo (1/18) with EF 60-65%, normal bioprosthetic aortic valve, mild-moderate MR, PASP 35 mmHg.  7. TAH-BSO 8. Type II diabetes: Has insulin pump.  9. H/o ischemic colitis. 10. H/o PUD. 11. Diabetic gastroparesis. 12. Hyperlipidemia: Myalgias with Crestor and atorvastatin, elevated LFTs with simvastatin.  Myalgias with > 10 mg pravastatin.  13. CKD 14. Bicuspid aortic valve s/p 19 mm Edwards pericardial valve in 1/11 (had post-operative Dresslers syndrome).   15. CAD: CABG with SVG-RCA in 12/05.  Redo SVG-RCA in 1/11 with AVR.  LHC/RHC (9/14) with mean RA 18, PA 62/25 mean 43, mean PCWP 26, CI 2.8, 70-80% distal LM stenosis, 80% ostial LAD stenosis, total occlusion RCA, SVG-PDA patent.  Patient had DES LM into LAD.  Plan to continue long-term ASA/Brilinta (poor responder to Plavix).  ETT-cardiolite (4/15) with 6' exercise, EF 60%, ST depression in recovery and mild chest pain, no ischemia or infarction by perfusion images.  Unstable angina 11/15 with 80% LM, patent SVG-RCA => DES to ostial LM.   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  17. Anemia: Iron-deficiency.  Suspect chronic GI blood loss.  EGD in 9/14 was unremarkable.  Capsule endoscopy 10/14 was unremarkable.  Has required periodic blood transfusions.  18. Atrial fibrillation: Paroxysmal,  noted by [facility] interrogation.  19. Carotid stenosis: Carotid dopplers (2/15) with 40-59% bilateral stenosis. Carotid dopplers (3/16) with 40-59% bilateral ICA stenosis. Carotid dopplers (3/17) with 40-59% bilateral ICA stenosis.  20. 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  22. C difficile colitis 2017 23. Gout  SH: Married, lives in [place], nonsmoker.   FH: CAD  ROS: All systems reviewed and negative except as per HPI.   Current Outpatient Prescriptions  Medication Sig Dispense Refill  . acyclovir cream (ZOVIRAX) 5 % apply to affected areas once daily needed for rash    . ALPRAZolam (XANAX) 0.5 MG tablet Take 0.5 mg by mouth at bedtime. May take additional dose daily for anxiety    . amLODipine (NORVASC) 5 MG tablet Take 1 tablet (5 mg total) by mouth daily. 90 tablet 3  . APIDRA 100 UNIT/ML injection INFUSE THROUGH INSULIN PUMP UTD. Has been receiving 82-125 units daily over last 10 days.  Asked pt to bring with her on admission 10/25  2  .  aspirin EC 81 MG tablet Take 81 mg by mouth at bedtime.    Marland Kitchen BRILINTA 60 MG TABS tablet TAKE 1 TABLET(60 MG) BY MOUTH TWICE DAILY 180 tablet 2  . Calcium Carbonate-Vitamin D (RA CALCIUM PLUS VITAMIN D) 600-400 MG-UNIT per tablet Take 1 tablet by mouth daily.     . Cholecalciferol 2000 UNITS TABS Take 1 tablet by mouth daily.    . clobetasol (TEMOVATE) 0.05 % external solution Apply 1 application topically daily as needed (for hives). Reported on 12/05/2015    . colchicine 0.6 MG tablet Take 0.6 mg by mouth daily. And,Take 1 tablet at onset and repeate in 1 hour and then 1 daily x 2 (FOR FLARES)    . Fe Fum-FA-B Cmp-C-Zn-Mg-Mn-Cu (HEMATINIC PLUS COMPLEX) 106-1 MG TABS Take 1 tablet oraly three times a week or as directed daily with with a Vitamin C tablet 60 each 0  . isosorbide mononitrate (IMDUR) 60 MG 24 hr tablet TAKE 1 TABLET  BY MOUTH ONCE DAILY 90 tablet 3  . Magnesium Oxide 250 MG TABS Take 250 mg by mouth daily.    . metoprolol (LOPRESSOR) 50 MG tablet TAKE 1 AND 1/2 TABLETS(75 MG) BY MOUTH TWICE DAILY 270 tablet 3  . Multiple Vitamins-Minerals (MULTIVITAMIN WITH MINERALS) tablet Take 1 tablet by mouth daily.      . Omega-3 Fatty Acids (FISH OIL) 1000 MG CAPS Take by mouth.    Marland Kitchen omeprazole (PRILOSEC) 20 MG capsule Take 20 mg by mouth every other day.     . ONE TOUCH ULTRA TEST test strip 1 each by Other route as needed for other.     . potassium chloride (K-DUR,KLOR-CON) 10 MEQ tablet Take 6 tablets (60 mEq total) by mouth 2 (two) times daily. 240 tablet 3  . pravastatin (PRAVACHOL) 10 MG tablet TAKE 1 TABLET BY MOUTH ONCE DAILY 90 tablet 3  . Probiotic Product (ALIGN) 4 MG CAPS Take by mouth.    . ranitidine (ZANTAC) 150 MG tablet Take 150 mg by mouth every other day. With omeprozole    . torsemide (DEMADEX) 20 MG tablet Take 2 tablets (40 mg total) by mouth 2 (two) times daily. 120 tablet 3  . ULORIC 40 MG tablet Take 40 mg by mouth daily.  11  . vitamin C (ASCORBIC ACID) 250 MG tablet Take 250 mg by mouth 2 (two) times daily.    Marland Kitchen azithromycin (ZITHROMAX) 500 MG tablet 1 tablet by mouth one hour prior to dental appointment/procedure (Patient not taking: Reported on 08/07/2016) 2 tablet 0  . glucagon (GLUCAGON EMERGENCY) 1 MG injection Inject 1 mg into the vein once as needed (for severe reaction). Reported on 12/05/2015    . nitroGLYCERIN (NITROLINGUAL) 0.4 MG/SPRAY spray Place 1 spray under the tongue every 5 (five) minutes as needed for chest pain. (Patient not taking: Reported on 08/07/2016) 12 g 1   No current facility-administered medications for this encounter.     BP 122/63   Pulse 83   Wt 118 lb 4 oz (53.6 kg)   SpO2 100%   BMI 21.11 kg/m  General: NAD.  Neck: JVP 7 cm, no thyromegaly or thyroid nodule.  Lungs: Clear CV: Nondisplaced PMI.  Heart regular S1/S2, no S3/S4, 2/6 early systolic murmur  RUSB.  No edema.  Left>right carotid bruit.  2+ PT pulses bilaterally.  Abdomen: Soft, nontender, no hepatosplenomegaly, no distention.  Skin: Intact without lesions or rashes.  Neurologic: Alert and oriented x 3.  Psych: Normal affect. Extremities: No  clubbing or cyanosis.   Assessment/Plan: 1. CAD: s/p SVG-RCA in 12/05, then redo SVG-RCA in 1/11--> distal left main/proximal LAD severe stenosis in 9/14.  PCI with DES to distal LM/proximal LAD was done rather than bypass as she would have been a poor candidate for redo sternotomy (2 prior sternotomies and history of chest wall radiation for Hodgkins disease).  Recurrent unstable angina with 80% ostial LM treated with DES in 11/15.  No chest pain. However, she has had significantly increased exertional dyspnea.  She remembers this as a possible anginal equivalent in the past.   - Continue Brilinta 60 mg bid (poor responder to plavix) long-term given recurrent left main disease.  This has been complicated by suspected slow GI bleed. Recent hemoglobin was normal. - Continue ASA 81, statin, metoprolol, Imdur at the current doses.    - I will arrange for ETT-Cardiolite on Monday.  If abnormal, suspect she will need right and left heart cath.   2. Bioprosthetic aortic valve replacement: In setting of bicuspid aortic valve, thoracic aorta not dilated on 2017 TEE.  Stable on 1/18 echo.   3. Chronic diastolic CHF: Recent echo unchanged compared to the past.  At last appointment, we increased her torsemide.  Weight is down about 8 lbs but exertional dyspnea is unchanged.  On exam, she no longer looks volume overloaded. - Continue torsemide at 40 mg bid and KCl 60 bid.  BMET/BNP today.  - Wear graded compression stockings.  4. CKD:  Patient has a single kidney s/p right nephrectomy.  She sees nephrology. Watch closely with increased diuretics.  5. Hyperlipidemia: She has not been able to tolerate any higher potency statin than pravastatin 10 mg daily.   -  Continue Praluent, good lipids when last checked.       6. Anemia: Suspected slow GI bleed.  Unremarkable EGD and capsule endoscopy in fall 2014.  Unfortunately, needs to stay on Brilinta long-term (Plavix non-responder).  Will need to follow closely (sees hematology), plan for transfusion with hemoglobin < 8 as this allows best control of her symptoms. Recently, hemoglobin has been more stable.   7. Atrial fibrillation: Paroxysmal, brief episodes noted on PCM interrogation.  Given GI bleeding and need to be on Brilinta, risks likely outweigh benefits of anticoagulation.  She is in NSR today.  PPM interrogated today, she had a short 6 minute run of atrial fibrilllation on Monday when she felt palpitations.  - Ongoing brief atrial fibrillation episodes. She is not a good candidate for anticoagulants due to to GI bleed.  She is not a good Multaq candidate with CHF.  If she needs an antiarrhythmic, consider Tikosyn or Sotalol.  - We were unable to place Watchman device due to nickel allergy => She wonders if her PPM is titanium-nickel, as if so, she tolerated this without problems.  Will look into it.   8. Carotid bruit: Mild to moderate bilateral stenosis.  Repeat carotid US in 3/18. 9. SSS: Pacemaker in place.  10. HTN: controlled. Continue current regimen.    Followup in 3 wks.   [name] 08/26/2016 1:44 PM

## 2016-08-27 ENCOUNTER — Other Ambulatory Visit (HOSPITAL_COMMUNITY): Payer: Self-pay | Admitting: Cardiology

## 2016-08-27 ENCOUNTER — Telehealth (HOSPITAL_COMMUNITY): Payer: Self-pay | Admitting: *Deleted

## 2016-08-27 ENCOUNTER — Ambulatory Visit (HOSPITAL_COMMUNITY): Payer: Medicare Other | Attending: Cardiology

## 2016-08-27 DIAGNOSIS — Z8249 Family history of ischemic heart disease and other diseases of the circulatory system: Secondary | ICD-10-CM | POA: Diagnosis not present

## 2016-08-27 DIAGNOSIS — I2510 Atherosclerotic heart disease of native coronary artery without angina pectoris: Secondary | ICD-10-CM | POA: Diagnosis not present

## 2016-08-27 DIAGNOSIS — R0609 Other forms of dyspnea: Secondary | ICD-10-CM

## 2016-08-27 LAB — MYOCARDIAL PERFUSION IMAGING
CSEPPHR: 121 {beats}/min
LVDIAVOL: 69 mL (ref 46–106)
LVSYSVOL: 26 mL
RATE: 0.28
Rest HR: 86 {beats}/min
SDS: 7
SRS: 8
SSS: 15
TID: 1.11

## 2016-08-27 MED ORDER — TECHNETIUM TC 99M TETROFOSMIN IV KIT
32.5000 | PACK | Freq: Once | INTRAVENOUS | Status: AC | PRN
  Administered 2016-08-27: 32.5 via INTRAVENOUS
  Filled 2016-08-27: qty 33

## 2016-08-27 MED ORDER — AMINOPHYLLINE 25 MG/ML IV SOLN
75.0000 mg | Freq: Once | INTRAVENOUS | Status: AC
  Administered 2016-08-27: 75 mg via INTRAVENOUS

## 2016-08-27 MED ORDER — REGADENOSON 0.4 MG/5ML IV SOLN
0.4000 mg | Freq: Once | INTRAVENOUS | Status: AC
  Administered 2016-08-27: 0.4 mg via INTRAVENOUS

## 2016-08-27 MED ORDER — TECHNETIUM TC 99M TETROFOSMIN IV KIT
10.1000 | PACK | Freq: Once | INTRAVENOUS | Status: AC | PRN
  Administered 2016-08-27: 10.1 via INTRAVENOUS
  Filled 2016-08-27: qty 11

## 2016-08-27 NOTE — Telephone Encounter (Signed)
Pt called to report she had a "reaction" to her myoview this AM and had to be given a medication.  She states she is feeling better but still has a lot of "shakiness" especially in her legs/knees and wanted to know if she needs to do anything else.  She states she did check her CBG which was normal and she has eaten lunch.  I called and spoke w/nuc med department, they state pt was given IV aminophylline and symptoms improved.  They state sometimes effects can linger a little longer in patients and will resolve in time.  Advised pt of this and to go home and rest, if not better later today she can call back as needed, pt agreeable

## 2016-08-28 ENCOUNTER — Telehealth: Payer: Self-pay | Admitting: Physician Assistant

## 2016-08-28 NOTE — Telephone Encounter (Signed)
Patient called answering service. See phone note yesterday as well as recent office visit. She is wonder if she has had prolonged side effects from recent stress test. She was quite symptomatic during the [place]. Still feels mildly weak and shaky. CBG has been normal. She hasn't taken her blood pressure today because she is at her daughter's in [facility] but it was normal on 2/2. No chest pain, but dyspnea on exertion persists. These are the same sx she felt when she saw Dr. Aundra Dubin on 08/24/16. She skipped torsemide last night. She took her dose this AM. Weight is at baseline per her report. I told her it can be somewhat difficult to evaluate over the phone. I offered ER evaluation so that someone can lay eyes on her to further evaluate - she prefers conservative management at home for now but expressed she certainly keep this in mind if symptoms get worse. She wonders if it is OK to skip her torsemide tonight since she is feeling weak. Recent labs showed elevated BUN/Cr. I advised she may hold torsemide/KCl tonight and to follow symptoms. I asked she please contact the office tomorrow for review by Dr. Aundra Dubin given complex hx to further guide medication recs. She verbalized understanding/gratitude.  Dayna Dunn PA-C

## 2016-08-28 NOTE — Telephone Encounter (Signed)
Would make her a followup appointment for reassessment.  May end up needing RHC to fully assess volume status +/- LHC given her ongoing symptoms.  Needs TSH drawn.

## 2016-08-29 ENCOUNTER — Encounter: Payer: Self-pay | Admitting: Cardiology

## 2016-08-29 NOTE — Telephone Encounter (Signed)
Spoke w/pt, she is aware of myoview results.  She feels like she is gradually getting better.  Offered appt for tomorrow however pt states she is unable to come then as she is out of town.  She states she will be leaving on Sun for [place] and will not be back until 2/21, she will keep appt as sch on 2/27, she will continue to monitor symptoms until then and call back if worse.  She will decrease Torsemide to only 20 mg in PM for now and will increase back to 40 mg as needed for wt gain/edema.

## 2016-08-30 ENCOUNTER — Encounter (HOSPITAL_COMMUNITY): Payer: Medicare Other

## 2016-09-12 DIAGNOSIS — Z9641 Presence of insulin pump (external) (internal): Secondary | ICD-10-CM | POA: Insufficient documentation

## 2016-09-18 ENCOUNTER — Ambulatory Visit (HOSPITAL_COMMUNITY)
Admission: RE | Admit: 2016-09-18 | Discharge: 2016-09-18 | Disposition: A | Discharge status: Home / Self Care | Payer: Medicare Other | Source: Ambulatory Visit | Attending: Cardiology | Admitting: Cardiology

## 2016-09-18 ENCOUNTER — Encounter (HOSPITAL_COMMUNITY): Payer: Self-pay

## 2016-09-18 VITALS — BP 133/63 | HR 76 | Wt 119.2 lb

## 2016-09-18 DIAGNOSIS — Z953 Presence of xenogenic heart valve: Secondary | ICD-10-CM | POA: Diagnosis not present

## 2016-09-18 DIAGNOSIS — I10 Essential (primary) hypertension: Secondary | ICD-10-CM | POA: Diagnosis not present

## 2016-09-18 DIAGNOSIS — Z7982 Long term (current) use of aspirin: Secondary | ICD-10-CM | POA: Insufficient documentation

## 2016-09-18 DIAGNOSIS — Z9889 Other specified postprocedural states: Secondary | ICD-10-CM | POA: Insufficient documentation

## 2016-09-18 DIAGNOSIS — I6523 Occlusion and stenosis of bilateral carotid arteries: Secondary | ICD-10-CM | POA: Diagnosis not present

## 2016-09-18 DIAGNOSIS — Z951 Presence of aortocoronary bypass graft: Secondary | ICD-10-CM | POA: Insufficient documentation

## 2016-09-18 DIAGNOSIS — Z8711 Personal history of peptic ulcer disease: Secondary | ICD-10-CM | POA: Insufficient documentation

## 2016-09-18 DIAGNOSIS — Z905 Acquired absence of kidney: Secondary | ICD-10-CM | POA: Insufficient documentation

## 2016-09-18 DIAGNOSIS — I5022 Chronic systolic (congestive) heart failure: Secondary | ICD-10-CM

## 2016-09-18 DIAGNOSIS — Z8249 Family history of ischemic heart disease and other diseases of the circulatory system: Secondary | ICD-10-CM | POA: Insufficient documentation

## 2016-09-18 DIAGNOSIS — E1122 Type 2 diabetes mellitus with diabetic chronic kidney disease: Secondary | ICD-10-CM | POA: Insufficient documentation

## 2016-09-18 DIAGNOSIS — Z8571 Personal history of Hodgkin lymphoma: Secondary | ICD-10-CM | POA: Insufficient documentation

## 2016-09-18 DIAGNOSIS — I25110 Atherosclerotic heart disease of native coronary artery with unstable angina pectoris: Secondary | ICD-10-CM | POA: Diagnosis not present

## 2016-09-18 DIAGNOSIS — E010 Iodine-deficiency related diffuse (endemic) goiter: Secondary | ICD-10-CM

## 2016-09-18 DIAGNOSIS — Z950 Presence of cardiac pacemaker: Secondary | ICD-10-CM | POA: Insufficient documentation

## 2016-09-18 DIAGNOSIS — Z9013 Acquired absence of bilateral breasts and nipples: Secondary | ICD-10-CM | POA: Insufficient documentation

## 2016-09-18 DIAGNOSIS — I480 Paroxysmal atrial fibrillation: Secondary | ICD-10-CM | POA: Diagnosis not present

## 2016-09-18 DIAGNOSIS — E1143 Type 2 diabetes mellitus with diabetic autonomic (poly)neuropathy: Secondary | ICD-10-CM | POA: Insufficient documentation

## 2016-09-18 DIAGNOSIS — M109 Gout, unspecified: Secondary | ICD-10-CM | POA: Insufficient documentation

## 2016-09-18 DIAGNOSIS — N189 Chronic kidney disease, unspecified: Secondary | ICD-10-CM | POA: Diagnosis not present

## 2016-09-18 DIAGNOSIS — K3184 Gastroparesis: Secondary | ICD-10-CM | POA: Diagnosis not present

## 2016-09-18 DIAGNOSIS — M79604 Pain in right leg: Secondary | ICD-10-CM | POA: Diagnosis not present

## 2016-09-18 DIAGNOSIS — I5032 Chronic diastolic (congestive) heart failure: Secondary | ICD-10-CM | POA: Diagnosis not present

## 2016-09-18 DIAGNOSIS — D649 Anemia, unspecified: Secondary | ICD-10-CM | POA: Insufficient documentation

## 2016-09-18 DIAGNOSIS — Z794 Long term (current) use of insulin: Secondary | ICD-10-CM | POA: Insufficient documentation

## 2016-09-18 DIAGNOSIS — E785 Hyperlipidemia, unspecified: Secondary | ICD-10-CM | POA: Diagnosis not present

## 2016-09-18 DIAGNOSIS — Z85528 Personal history of other malignant neoplasm of kidney: Secondary | ICD-10-CM | POA: Insufficient documentation

## 2016-09-18 DIAGNOSIS — E1343 Other specified diabetes mellitus with diabetic autonomic (poly)neuropathy: Secondary | ICD-10-CM | POA: Diagnosis not present

## 2016-09-18 LAB — BASIC METABOLIC PANEL
ANION GAP: 13 (ref 5–15)
BUN: 28 mg/dL — ABNORMAL HIGH (ref 6–20)
CALCIUM: 10.5 mg/dL — AB (ref 8.9–10.3)
CO2: 28 mmol/L (ref 22–32)
Chloride: 99 mmol/L — ABNORMAL LOW (ref 101–111)
Creatinine, Ser: 1.03 mg/dL — ABNORMAL HIGH (ref 0.44–1.00)
GFR, EST NON AFRICAN AMERICAN: 57 mL/min — AB (ref >60)
Glucose, Bld: 116 mg/dL — ABNORMAL HIGH (ref 65–99)
POTASSIUM: 3.8 mmol/L (ref 3.5–5.1)
Sodium: 140 mmol/L (ref 135–145)

## 2016-09-18 LAB — TSH: TSH: 1.99 u[IU]/mL (ref 0.350–4.500)

## 2016-09-18 NOTE — Patient Instructions (Signed)
Labs today (will call for abnormal results, otherwise no news is good news)  Sleep study has been ordered for you.  Follow up in 3 Months

## 2016-09-18 NOTE — Progress Notes (Signed)
Patient ID: [name] [name], female   DOB: [birth year], 66 y.o.   MRN: [id number] PCP: Dr. Hilma Favors Oncologist: Dr Marko Plume Cardiology: Dr. Aundra Dubin  66 yo with history of CAD s/p CABG in 12/05 and redo in 1/11, bioprosthetic AVR, diastolic CHF, and sick sinus syndrome s/p PCM presents for cardiology followup.  She had initial CABG with SVG-RCA in 12/05 followed by redo SVG-RCA and bioprosthetic AVR in 1/11.  She has had diastolic CHF and is on Lasix.  In 9/14, she had a CPX test.  This showed severe functional limitation with ischemic ECG changes (inferolateral ST depression and ST elevation in V1 and V2). She had chest pain.  She had R/LHC in 9/14.  This showed elevated left and right heart filling pressures, patent SVG-PDA, and 80% distal LM/80% ostial LAD stenosis.  She was not a candidate for redo CABG (had 2 prior sternotomies as well as chest radiation for Hodgkins lymphoma).  She had DES from left main into proximal LAD and will need long-term DAPT => Brilinta was used as she has been a poor responder to Plavix in the past.  She re-developed angina in 11/15 and was admitted with unstable angina.  LHC showed 80% ostial LM and patent SVG-RCA. She had DES to ostial LM. Last echo in 1/18 showed EF 60-65%, normal bioprosthetic aortic valve, mild-moderate MR, PASP 35 mmHg.   She has chronic iron deficiency anemia thought to be due to a slow GI bleed.  Last EGD was unremarkable and a capsule endoscopy was also unremarkable.  She has had periodic transfusions; However, recently hemoglobin has been stable.    She had bilateral mastectomy for DCIS in 17/35 without complication.  She has an insulin pump followed by an endocrinologist at [facility].   Given myalgias with statins, she was started on Praluent.  LDL is now excellent.    In the past she has had short runs of atrial fibrillation.  She is not anticoagulated.  Planned for Watchman placement, but given her nickel allergy, we were unable to place a Watchman.   She felt some palpitations earlier this week, suspects atrial fibrillation.  Today, she is in NSR.   At a prior appointment, given dyspnea and weight gain, we increased her diuretic.  She has lost around 9-10 lbs since that time.  Breathing is better.  She is short of breath still with inclines and stairs, but she's doing ok on flat ground at a steady pace.  She continues to have some generalized fatigue.  No orthopnea.  No chest pain, rare episodes of lightheadedness.  Cardiolite in 2/18 showed no ischemia or infarction.     Labs (7/14): hemoglobin 11 Labs (9/14): K 3.9, creatinine 1.48, hemoglobin 7.3 Labs (10/14): hemoglobin 7.7 Labs (11/14): K 3.8, creatinine 1.5 Labs (12/14): HCT 37.7 Labs (1/15): LDL 106, HDL 41, K 3.9, creatinine 1.6, LFTs normal Labs (2/15): K 3.7, creatinine 1.1, BNP 71 Labs (3/15): HCT 36.7 Labs (4/15): K 3.7, creatinine 1.1, HCT 35.5 Labs (7/15): HCT 36.4 Labs (11/15): HCT 32.1, K 4.2, creatinine 1.06 Labs (12/15): hgb 12 Labs (1/16): HCT 37.1 Labs (3/16): LDL 25, HDL 42, elevated transaminases Labs (6/16): HCT 39.6, plts 146 Labs (7/16): LDL 59, HDL 41, AST 34, ALT 49, AP 125 Labs (11/16): HCT 38.8, alkaline phosphatase 156, AST/ALT normal, K 4, creatinine 1.0 Labs (3/17): K 4.4, creatinine 1.4, LFTs normal, hgb 12.5 Labs (5/17): HCT 41.8, K 4, creatinine 1.3 Labs (9/17): K 4, creatinine 0.43, hgb 13.6 Labs (10/17): LDL 42,  HDL 43 Labs (1/18): K 4.3, creatinine 1.1 => 1.19, AST 66, ALT 67, hgb 14.2, BNP 72 Labs (2/18): K 3.9, creatinine 1.2, BNP 49.6, HCT 42.1  PMH: 1. Sick sinus syndrome s/p [employer] PCM.  2. HTN 3. H/o SBO 4. Renal cell carcinoma s/p right nephrectomy in 2008.  5. 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  Fibroscan in 11/14 showed advanced fibrosis concerning for cirrhosis. EGD in 11/14 showed no varices.  6. Diastolic CHF: Echo (8/45) with EF 60-65%, bioprosthetic aortic valve with mean gradient 15 mmHg, mild MR.  CPX (9/14): peak VO2  10.4, VE/VCO2 43.8, inferolateral ST depression and ST elevation in V1/V2 with severe dyspnea and chest pain, severe functional limitation with ischemic changes.  Echo (9/14) with EF 60-65%, normal RV size and systolic function, mild-moderate MR, bioprosthetic aortic valve normal, PA systolic pressure 51 mmHg, no evidence for pericardial constriction though exam incomplete for constriction.  Echo (11/15) with EF 55-60%, bioprosthetic aortic valve, mild-moderate MR, moderate TR, PA systolic pressure 42 mmHg.  - TEE (5/17) with EF 60-65%, normal bioprosthetic aortic valve with mean gradient 13 mmHg, normal RV size and systolic function. - Echo (1/18) with EF 60-65%, normal bioprosthetic aortic valve, mild-moderate MR, PASP 35 mmHg.  7. TAH-BSO 8. Type II diabetes: Has insulin pump.  9. H/o ischemic colitis. 10. H/o PUD. 11. Diabetic gastroparesis. 12. Hyperlipidemia: Myalgias with Crestor and atorvastatin, elevated LFTs with simvastatin.  Myalgias with > 10 mg pravastatin.  13. CKD 14. Bicuspid aortic valve s/p 19 mm Edwards pericardial valve in 1/11 (had post-operative Dresslers syndrome).   15. CAD: CABG with SVG-RCA in 12/05.  Redo SVG-RCA in 1/11 with AVR.  LHC/RHC (9/14) with mean RA 18, PA 62/25 mean 43, mean PCWP 26, CI 2.8, 70-80% distal LM stenosis, 80% ostial LAD stenosis, total occlusion RCA, SVG-PDA patent.  Patient had DES LM into LAD.  Plan to continue long-term ASA/Brilinta (poor responder to Plavix).  ETT-cardiolite (4/15) with 6' exercise, EF 60%, ST depression in recovery and mild chest pain, no ischemia or infarction by perfusion images.  Unstable angina 11/15 with 80% LM, patent SVG-RCA => DES to ostial LM.   - Lexiscan Cardiolite (2/18): EF 62%, no ischemia or infarction (normal).  She had a significant reaction to [place] and probably should not get again.  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  17. Anemia: Iron-deficiency.  Suspect  chronic GI blood loss.  EGD in 9/14 was unremarkable.  Capsule endoscopy 10/14 was unremarkable.  Has required periodic blood transfusions.  18. Atrial fibrillation: Paroxysmal, noted by [facility] interrogation.  19. Carotid stenosis: Carotid dopplers (2/15) with 40-59% bilateral stenosis. Carotid dopplers (3/16) with 40-59% bilateral ICA stenosis. Carotid dopplers (3/17) with 40-59% bilateral ICA stenosis.  20. 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  22. C difficile colitis 2017 23. Gout  SH: Married, lives in [place], nonsmoker.   FH: CAD  ROS: All systems reviewed and negative except as per HPI.   Current Outpatient Prescriptions  Medication Sig Dispense Refill  . acyclovir cream (ZOVIRAX) 5 % apply to affected areas once daily needed for rash    . ALPRAZolam (XANAX) 0.5 MG tablet Take 0.5 mg by mouth at bedtime. May take additional dose daily for anxiety    . amLODipine (NORVASC) 5 MG tablet Take 1 tablet (5 mg total) by mouth daily. 90 tablet 3  .  APIDRA 100 UNIT/ML injection INFUSE THROUGH INSULIN PUMP UTD. Has been receiving 82-125 units daily over last 10 days.  Asked pt to bring with her on admission 10/25  2  . aspirin EC 81 MG tablet Take 81 mg by mouth at bedtime.    Marland Kitchen BRILINTA 60 MG TABS tablet TAKE 1 TABLET(60 MG) BY MOUTH TWICE DAILY 180 tablet 2  . Calcium Carbonate-Vitamin D (RA CALCIUM PLUS VITAMIN D) 600-400 MG-UNIT per tablet Take 1 tablet by mouth daily.     . Cholecalciferol 2000 UNITS TABS Take 1 tablet by mouth daily.    . clobetasol (TEMOVATE) 0.05 % external solution Apply 1 application topically daily as needed (for hives). Reported on 12/05/2015    . Fe Fum-FA-B Cmp-C-Zn-Mg-Mn-Cu (HEMATINIC PLUS COMPLEX) 106-1 MG TABS Take 1 tablet oraly three times a week or as directed daily with with a Vitamin C tablet 60 each 0  . glucagon (GLUCAGON  EMERGENCY) 1 MG injection Inject 1 mg into the vein once as needed (for severe reaction). Reported on 12/05/2015    . isosorbide mononitrate (IMDUR) 60 MG 24 hr tablet TAKE 1 TABLET BY MOUTH ONCE DAILY 90 tablet 3  . Magnesium Oxide 250 MG TABS Take 250 mg by mouth daily.    . metoprolol (LOPRESSOR) 50 MG tablet TAKE 1 AND 1/2 TABLETS(75 MG) BY MOUTH TWICE DAILY 270 tablet 3  . Multiple Vitamins-Minerals (MULTIVITAMIN WITH MINERALS) tablet Take 1 tablet by mouth daily.      . Omega-3 Fatty Acids (FISH OIL) 1000 MG CAPS Take by mouth.    Marland Kitchen omeprazole (PRILOSEC) 20 MG capsule Take 20 mg by mouth every other day.     . ONE TOUCH ULTRA TEST test strip 1 each by Other route as needed for other.     . potassium chloride (K-DUR,KLOR-CON) 10 MEQ tablet Take 6 tablets (60 mEq total) by mouth 2 (two) times daily. 240 tablet 3  . pravastatin (PRAVACHOL) 10 MG tablet TAKE 1 TABLET BY MOUTH ONCE DAILY 90 tablet 3  . Probiotic Product (ALIGN) 4 MG CAPS Take by mouth.    . ranitidine (ZANTAC) 150 MG tablet Take 150 mg by mouth every other day. With omeprozole    . torsemide (DEMADEX) 20 MG tablet Take 2 tablets (40 mg total) by mouth 2 (two) times daily. 120 tablet 3  . ULORIC 40 MG tablet Take 40 mg by mouth daily.  11  . vitamin C (ASCORBIC ACID) 250 MG tablet Take 250 mg by mouth 2 (two) times daily.    Marland Kitchen azithromycin (ZITHROMAX) 500 MG tablet 1 tablet by mouth one hour prior to dental appointment/procedure (Patient not taking: Reported on 08/07/2016) 2 tablet 0  . colchicine 0.6 MG tablet Take 0.6 mg by mouth daily. And,Take 1 tablet at onset and repeate in 1 hour and then 1 daily x 2 (FOR FLARES)    . nitroGLYCERIN (NITROLINGUAL) 0.4 MG/SPRAY spray Place 1 spray under the tongue every 5 (five) minutes as needed for chest pain. (Patient not taking: Reported on 08/07/2016) 12 g 1   No current facility-administered medications for this encounter.     BP 133/63   Pulse 76   Wt 119 lb 4 oz (54.1 kg)   SpO2  100%   BMI 21.29 kg/m  General: NAD.  Neck: JVP 7 cm, no thyromegaly or thyroid nodule.  Lungs: Clear CV: Nondisplaced PMI.  Heart regular S1/S2, no S3/S4, 2/6 early systolic murmur RUSB.  No edema.  Left>right carotid bruit.  2+ PT pulses bilaterally.  Abdomen: Soft, nontender, no hepatosplenomegaly, no distention.  Skin: Intact without lesions or rashes.  Neurologic: Alert and oriented x 3.  Psych: Normal affect. Extremities: No clubbing or cyanosis.   Assessment/Plan: 1. CAD: s/p SVG-RCA in 12/05, then redo SVG-RCA in 1/11--> distal left main/proximal LAD severe stenosis in 9/14.  PCI with DES to distal LM/proximal LAD was done rather than bypass as she would have been a poor candidate for redo sternotomy (2 prior sternotomies and history of chest wall radiation for Hodgkins disease).  Recurrent unstable angina with 80% ostial LM treated with DES in 11/15.  No chest pain. Recent Lexiscan Cardiolite in 2/18 showed normal EF, no ischemia/infarction. - Continue Brilinta 60 mg bid (poor responder to plavix) long-term given recurrent left main disease.  This has been complicated by suspected slow GI bleed. Recent hemoglobin was normal. - Continue ASA 81, statin, metoprolol, Imdur at the current doses.    2. Bioprosthetic aortic valve replacement: In setting of bicuspid aortic valve, thoracic aorta not dilated on 2017 TEE.  Stable on 1/18 echo.   3. Chronic diastolic CHF: Recent echo unchanged compared to the past.  At last appointment, we increased her torsemide.  Weight has trended down, she does not look volume overloaded on exam.  NYHA class II symptoms now, improved.  - Continue torsemide at 40 mg bid and KCl 60 bid.  BMET today.   4. CKD:  Patient has a single kidney s/p right nephrectomy.  She sees nephrology. BMET today.  5. Hyperlipidemia: She has not been able to tolerate any higher potency statin than pravastatin 10 mg daily.   - Continue Praluent, good lipids when last checked.        6. Anemia: Suspected slow GI bleed.  Unremarkable EGD and capsule endoscopy in fall 2014.  Unfortunately, needs to stay on Brilinta long-term (Plavix non-responder).  Will need to follow closely (sees hematology), plan for transfusion with hemoglobin < 8 as this allows best control of her symptoms. Recently, hemoglobin has been more stable.   7. Atrial fibrillation: Paroxysmal, brief episodes noted on PCM interrogation.  Given GI bleeding and need to be on Brilinta, risks likely outweigh benefits of anticoagulation.  She is in NSR today.  PPM interrogated today, she had a short 6 minute run of atrial fibrilllation on Monday when she felt palpitations.  - Ongoing brief atrial fibrillation episodes. She is not a good candidate for anticoagulants due to to GI bleed.  She is not a good Multaq candidate with CHF.  If she needs an antiarrhythmic, consider Tikosyn or Sotalol.  - We were unable to place Watchman device due to nickel allergy => She wonders if her PPM is titanium-nickel, as if so, she tolerated this without problems.  Will look into it.   8. Carotid bruit: Mild to moderate bilateral stenosis.  Repeat carotid US in 3/18. 9. SSS: Pacemaker in place.  10. HTN: controlled. Continue current regimen.   11. Fatigue: Check TSH.  Husband tells her that she sometimes stops breathing at night.  I will arrange a sleep study.   Followup in 3 months   [name] 09/18/2016

## 2016-10-02 ENCOUNTER — Ambulatory Visit (HOSPITAL_COMMUNITY)
Admission: RE | Admit: 2016-10-02 | Discharge: 2016-10-02 | Disposition: A | Discharge status: Home / Self Care | Payer: Medicare Other | Source: Ambulatory Visit | Attending: Cardiovascular Disease | Admitting: Cardiovascular Disease

## 2016-10-02 DIAGNOSIS — I6523 Occlusion and stenosis of bilateral carotid arteries: Secondary | ICD-10-CM | POA: Diagnosis not present

## 2016-10-04 ENCOUNTER — Other Ambulatory Visit: Payer: Self-pay | Admitting: Cardiology

## 2016-10-05 ENCOUNTER — Telehealth (HOSPITAL_COMMUNITY): Payer: Self-pay | Admitting: *Deleted

## 2016-10-05 DIAGNOSIS — I2510 Atherosclerotic heart disease of native coronary artery without angina pectoris: Secondary | ICD-10-CM

## 2016-10-05 NOTE — Telephone Encounter (Signed)
VAS US CAROTID  Order: [id number]  Status:  Final result Visible to patient:  Yes (MyChart) Dx:  Bilateral carotid artery stenosis  Notes recorded by Kennieth Rad, RN on 10/05/2016 at 9:51 AM EDT Patient called back, reviewed results with her and no further questions at this time. Test ordered for 1 year from now. ------  Notes recorded by Scarlette Calico, RN on 10/05/2016 at 9:05 AM EDT Left message to call back ------  Notes recorded by Larey Dresser, MD on 10/04/2016 at 10:56 PM EDT Mild bilateral ICA stenosis, repeat study in 1 year.

## 2016-10-15 ENCOUNTER — Telehealth (HOSPITAL_COMMUNITY): Payer: Self-pay | Admitting: *Deleted

## 2016-10-15 DIAGNOSIS — R4781 Slurred speech: Secondary | ICD-10-CM

## 2016-10-15 NOTE — Telephone Encounter (Signed)
Pt called to report an episode of slurred speech last Mon 3/19.  She states it lasted a few minutes,then she rested, she also reports feeling very fatigued and "wiped out" last Monday.  She reports after resting she was fine, she reports the whole episode including her resting lasted only about 30 min to an hour.  She reports later that evening she had a 5 min episode of a-fib.  She states she has not any more slurred speech and has left fine since then other a "stiff neck" on Wed and a forgetful moment early this AM.  She denies any symptoms at this time.  She does report an 8 min episode of a-fib on Thursday as well.    I discussed all with [name], pt w/history of parox a-fib no anticoags due to gi bleed, he advised ?TIA, refer to neuro.  He will f/u with Dr Rayann Heman regarding pt's ability to get Watchman w/pt's Nickel allergy.  Pt is aware of recommendations, referral placed for neuro

## 2016-10-16 ENCOUNTER — Encounter: Payer: Self-pay | Admitting: Neurology

## 2016-10-21 ENCOUNTER — Encounter: Payer: Self-pay | Admitting: Cardiology

## 2016-10-22 ENCOUNTER — Telehealth (HOSPITAL_COMMUNITY): Payer: Self-pay | Admitting: *Deleted

## 2016-10-22 ENCOUNTER — Other Ambulatory Visit (HOSPITAL_COMMUNITY): Payer: Self-pay | Admitting: *Deleted

## 2016-10-22 MED ORDER — POTASSIUM CHLORIDE CRYS ER 10 MEQ PO TBCR: 60.0000 meq | EXTENDED_RELEASE_TABLET | Freq: Two times a day (BID) | ORAL | 3 refills | Status: DC

## 2016-10-22 NOTE — Telephone Encounter (Signed)
Pt called in reporting she had a "CHF Exacerbation" over night and called the on-call cardiologist who advised her to take an extra torsemide.  Pt said she was leaving for the [place] today and wanted to ask Dr. Aundra Dubin if there was a protocol for when she can take extra torsemide in the future.    I called patient back and advised her that if she were to have weight gain of 3 lbs or more overnight or 5 lbs in a week to call us.  She understands and no further questions.

## 2016-10-30 ENCOUNTER — Other Ambulatory Visit: Payer: Self-pay | Admitting: Internal Medicine

## 2016-11-01 ENCOUNTER — Telehealth: Payer: Self-pay

## 2016-11-01 ENCOUNTER — Telehealth (HOSPITAL_COMMUNITY): Payer: Self-pay | Admitting: *Deleted

## 2016-11-01 NOTE — Telephone Encounter (Signed)
Pt called the triage line stating last Saturday she ws bleeding from her face. She applied pressure and cleaned her face and couldn't determine where the blood was coming from. She had no abrasions.  Patient said the next morning she woke up with blood in her hair from bleeding in her left ear. She stated she also passed a blood clot out of her left ear. Pt saw her PCP this week and they could see residual blood in her ear still. They ran an anemia panel everything was normal except her ferritin count was abnormally high. She is due to see Dr.Gudena in May but was advised to inform Dr.McLean since she is on a blood thinner.   Message routed to [place]

## 2016-11-01 NOTE — Telephone Encounter (Signed)
Received call from pt to report that she had a significant bleeding episode last weekend. Pt states Saturday, pt noticed moderate bleeding from her L cheek, Saturday night, pt noticed moderate bleeding on her L lower cheek again. Pt unaware of source when this happened. Sunday morning, pt states " I woke up with blood on my left face/ear. There were some small to moderate clots in my ear and hair." "I called my pcp and had an appt on Tuesday. My bleeding had resolved since. He states that my ferretin level were high 498, and that I need to call my hematologist to see what's going on." Pt denies any other instructions from her pcp. Pt is out of town this week and unable to come in tomorrow for work up. Obtained pcp's number to have them fax recent progress note and lab work to show [place]. Pt is currently asymptomatic. Noted on her med list that she currently takes Brilinta 32m daily. Pt states that she has been on it for 3 yrs and had never had this type of bleeding before. Pt is on several cardiac medications. Told pt that she may need to stop Brilinta, but to first, consult and call her cardiologist today and report of symptoms. Pt denies dizziness, chest pain, bleeding, bruising at this time. Suggested if pt can come in on Monday. Pt states that she already has 3 other dr appointments that day. Will notify Dr.Gudena of pt event and will try to schedule her for a follow up with him next week. In the meantime, told pt to go to ED if her symptoms comes back or gets worse (sob,cp,diziness,palpitation) Pt verbalized understanding.  Called Dr.Golding's office and spoke with receptionist to have progress note and recent lab fax over today. Awaiting for fax at this time.

## 2016-11-01 NOTE — Telephone Encounter (Signed)
Not sure why she would be bleeding from her ear.  If recurs, needs to have PCP examine with otoscope.

## 2016-11-06 ENCOUNTER — Ambulatory Visit (HOSPITAL_BASED_OUTPATIENT_CLINIC_OR_DEPARTMENT_OTHER): Payer: Medicare Other | Attending: Cardiology | Admitting: Cardiology

## 2016-11-06 ENCOUNTER — Telehealth: Payer: Self-pay | Admitting: Emergency Medicine

## 2016-11-06 ENCOUNTER — Other Ambulatory Visit: Payer: Self-pay | Admitting: Emergency Medicine

## 2016-11-06 VITALS — Ht 63.0 in | Wt 120.0 lb

## 2016-11-06 DIAGNOSIS — I5022 Chronic systolic (congestive) heart failure: Secondary | ICD-10-CM | POA: Diagnosis present

## 2016-11-06 DIAGNOSIS — G4733 Obstructive sleep apnea (adult) (pediatric): Secondary | ICD-10-CM | POA: Diagnosis not present

## 2016-11-06 DIAGNOSIS — C8190 Hodgkin lymphoma, unspecified, unspecified site: Secondary | ICD-10-CM

## 2016-11-06 DIAGNOSIS — D0512 Intraductal carcinoma in situ of left breast: Secondary | ICD-10-CM

## 2016-11-06 NOTE — Telephone Encounter (Signed)
Spoke with patient; no changes or acute distress noted. Reviewed labs from PCP with Dr Lindi Adie; no concerns voiced per Dr Lindi Adie; advised patient to notify this office of any changes or concerns. Will repeat ferritin in May with office visit. Patient verbalized understanding.

## 2016-11-07 NOTE — Progress Notes (Signed)
[name] [name] was seen today in neurologic consultation at the request of Dr. Aundra Dubin.  Her PCP is Purvis Kilts, MD.    The patient is a 66 y.o. year old female with a history of congestive heart failure, prostatic valve replacement, Hodgkin's lymphoma, sick sinus syndrome, chronic kidney disease who called the CHF clinic to report an episode of slurred speech on 10/08/2016.  She states that the "syllables were mixing up" when she left the message on the pharmacy recording and the pharmacist had to call her back to try to understand her needs.  Her husband came in 15 min later and told her that she was "overcompensating" and enunciating "too much."  She states that she needed to do that in order to get the words out.  She states that she was having "mild a-fib" and when asked when that meant she states it was "slightly irregular."  No palpitations.  She then got up and made dinner and then it was fine.  She states that the entire episode lasted 53mn-30 minutes.  She denies any associated lateralizing weakness or paresthesias.  No swallowing difficulties.  No diplopia.  She did feel that she had a dry mouth.   She did feel very tired and decided to rest and upon awakening felt normal.    She never felt like this previously and never since.  She is on Brilitena as well as all other meds and had not missed any dosages.  Pt also c/o memory change.  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  Takes own meds and doesn't forget them.  Drives without trouble.   Neuroimaging has not previously been performed in the recent years.  Carotid ultrasound was done on 10/02/2016 and was normal at 1-39% stenosis bilaterally.   ALLERGIES:   Allergies  Allergen Reactions  . Avandia [Rosiglitazone Maleate] Other (See Comments)    Congestive heart failure  . Cephalexin Anaphylaxis, Swelling and Rash    Extremities swell ,  throat swelling   . Clindamycin Hcl Anaphylaxis and Shortness Of Breath  . Lincomycin Anaphylaxis and Shortness Of Breath  . Metformin Other (See Comments)    ? Congestive heart failure ?  .Marland KitchenNovolog [Insulin Aspart] Hives and Itching    Humalog & Novolog big knots and whelp    . Penicillins Anaphylaxis, Hives, Swelling and Rash    Has patient had a PCN reaction causing immediate rash, facial/tongue/throat swelling, SOB or lightheadedness with hypotension: Yes Has patient had a PCN reaction causing severe rash involving mucus membranes or skin necrosis: No Has patient had a PCN reaction that required hospitalization No Has patient had a PCN reaction occurring within the last 10 years: No If all of the above answers are "NO", then may proceed with Cephalosporin use.   . Tizanidine Other (See Comments)    Dizziness, Mental Status Changes, Hallucination    . Adhesive [Tape] Other (See Comments)    Tears skin, Please use "paper" tape  . Atorvastatin Other (See Comments)    increased LFT's, no muscle weakness, leg pain Muscle and joint pain increased LFT's   . Cholestyramine Other (See Comments)    Liver Disorder Elevated LFTs   . Crestor [Rosuvastatin] Nausea And Vomiting, Nausea Only and Other (See Comments)    Muscle and joint pain & increased LFTs; tolerates Pravastatin 185m(max)  . Gentamycin [Gentamicin] Hives, Itching and Rash  Fine red bumps.  Reaction noted post loop recorder implant, where gentamycin was used for irrigation. Topical prep  . Humalog [Insulin Lispro] Hives and Itching    Pt uses Apidra   . Iodinated Diagnostic Agents Other (See Comments)    PAIN DURING LYMPHANGIOGRAM '81, NO ALLERGY TO IV CONTRAST. PAIN DURING LYMPHANGIOGRAM '86, NO ALLERGY TO IV CONTRAST  . Limonene Hives, Itching and Rash    Reaction noted post loop recorder implant, where gentamycin was used for irrigation. Topical prep  . Pravastatin Other (See Comments)    In high doses (20-87m)  causes muscle and joint pain   . Prednisone Itching    B/P went high, itching all over   . Simvastatin Other (See Comments)    Increased LFT's  . Strawberry Extract Hives, Itching and Rash    Reaction noted post loop recorder implant, where gentamycin was used for irrigation. Topical prep  . Plavix [Clopidogrel Bisulfate] Other (See Comments)    P2Y12 testing = 271 while on Plavix  . Codeine Nausea And Vomiting  . Erythromycin Nausea And Vomiting  . Erythromycin Base Nausea And Vomiting  . Hydrocodone-Acetaminophen Itching and Rash    itching  . Latex Itching    I"if wear gloves hands get red ,itchy no prioblem if other wear and touch me"  . Neomycin Rash    Redness  . Nickel Itching  . Ranitidine Nausea Only  . Tamiflu [Oseltamivir] Nausea And Vomiting  . Tramadol Nausea Only    CURRENT MEDICATIONS:  Outpatient Encounter Prescriptions as of 11/13/2016  Medication Sig  . Alirocumab (PRALUENT [place]) Inject 150 mLs into the skin every 14 (fourteen) days.  . ALPRAZolam (XANAX) 0.5 MG tablet Take 0.5 mg by mouth at bedtime. May take additional dose daily for anxiety  . amLODipine (NORVASC) 5 MG tablet Take 1 tablet (5 mg total) by mouth daily.  . APIDRA 100 UNIT/ML injection INFUSE THROUGH INSULIN PUMP UTD. Has been receiving 82-125 units daily over last 10 days.  Asked pt to bring with her on admission 10/25  . aspirin EC 81 MG tablet Take 81 mg by mouth at bedtime.  .Marland KitchenBRILINTA 60 MG TABS tablet TAKE 1 TABLET(60 MG) BY MOUTH TWICE DAILY  . Calcium Carbonate-Vitamin D (RA CALCIUM PLUS VITAMIN D) 600-400 MG-UNIT per tablet Take 1 tablet by mouth daily.   . Cholecalciferol 2000 UNITS TABS Take 1 tablet by mouth daily.  . clobetasol (TEMOVATE) 0.05 % external solution Apply 1 application topically daily as needed (for hives). Reported on 12/05/2015  . colchicine 0.6 MG tablet Take 0.6 mg by mouth daily. And,Take 1 tablet at onset and repeate in 1 hour and then 1 daily x 2 (FOR FLARES)  . Fe  Fum-FA-B Cmp-C-Zn-Mg-Mn-Cu (HEMATINIC PLUS COMPLEX) 106-1 MG TABS Take 1 tablet oraly three times a week or as directed daily with with a Vitamin C tablet  . glucagon (GLUCAGON EMERGENCY) 1 MG injection Inject 1 mg into the vein once as needed (for severe reaction). Reported on 12/05/2015  . isosorbide mononitrate (IMDUR) 60 MG 24 hr tablet TAKE 1 TABLET BY MOUTH ONCE DAILY  . Magnesium Oxide 250 MG TABS Take 250 mg by mouth daily.  . metoprolol (LOPRESSOR) 50 MG tablet TAKE 1 AND 1/2 TABLETS(75 MG) BY MOUTH TWICE DAILY  . Multiple Vitamins-Minerals (MULTIVITAMIN WITH MINERALS) tablet Take 1 tablet by mouth daily.    . nitroGLYCERIN (NITROLINGUAL) 0.4 MG/SPRAY spray Place 1 spray under the tongue every 5 (five) minutes as needed for  chest pain.  . Omega-3 Fatty Acids (FISH OIL) 1000 MG CAPS Take by mouth.  Marland Kitchen omeprazole (PRILOSEC) 20 MG capsule Take 20 mg by mouth every other day.   . ONE TOUCH ULTRA TEST test strip 1 each by Other route as needed for other.   . potassium chloride (K-DUR,KLOR-CON) 10 MEQ tablet Take 6 tablets (60 mEq total) by mouth 2 (two) times daily.  . pravastatin (PRAVACHOL) 10 MG tablet TAKE 1 TABLET BY MOUTH ONCE DAILY  . Probiotic Product (ALIGN) 4 MG CAPS Take by mouth.  . torsemide (DEMADEX) 20 MG tablet Take 2 tablets (40 mg total) by mouth 2 (two) times daily.  Marland Kitchen ULORIC 40 MG tablet Take 40 mg by mouth daily.  . vitamin C (ASCORBIC ACID) 250 MG tablet Take 250 mg by mouth 2 (two) times daily.  . [DISCONTINUED] acyclovir cream (ZOVIRAX) 5 % apply to affected areas once daily needed for rash  . [DISCONTINUED] ranitidine (ZANTAC) 150 MG tablet Take 150 mg by mouth every other day. With omeprozole  . azithromycin (ZITHROMAX) 500 MG tablet 1 tablet by mouth one hour prior to dental appointment/procedure (Patient not taking: Reported on 11/13/2016)   No facility-administered encounter medications on file as of 11/13/2016.     PAST MEDICAL HISTORY:   Past Medical History:    Diagnosis Date  . Anxiety   . Aortic stenosis    a. bicuspid aortic valve; mean gradient of 20 mmHg in 2/10; b. s/p bioprosth AVR (19-mm Edwards pericardial valve) with a redo coronary artery bypass graft procedure in 07/2009; postoperative Dressler's syndrome    . Arthritis   . Carcinoma, renal cell ([place]) 05/2007   Laparoscopic right nephrectomy  . Chronic diastolic CHF (congestive heart failure) ([place])    a. 9.2014 EchoP EF 60-65%, no rwma, bioprosth AVR, mean gradient of 66mHg, mild to mod MR, PASP 551mg.  . CKD (chronic kidney disease), stage II   . Colitis, ischemic ([facility]2008  . Coronary artery disease    a. initial CABG with SVG-RCA in 12/05. b. redo SVG-RCA and bioprosthetic AVR in 1/11. d. 9/14 - PCI with DES to distal LM/proximal LAD was done rather than bypass as she would have been a poor candidate for redo sternotomy. d. S/p DES to prox LM 05/2014.  . Diabetes mellitus 03/2010    TYPE II: Hemoglobin A1c of 7.4 in  03/2010; 8.4 in 06/2010; treated with insulin pump  . Duodenal ulcer    Remote; H. pylori positive  . Gastroparesis due to DM ([facility]10/04/2012  . Hodgkin's disease ([facility]1991   [place]le radiation therapy  . Hyperlipidemia   . Hyperplastic gastric polyp 06/23/2012  . Hypertension   . Iron deficiency anemia    a. 03/2013 EGD: essentially normal. Possible slow GIB.  . Migraines   . NASH (nonalcoholic steatohepatitis) 1999   -biopsy in 1999  . Osteopenia     hip on DEXA in October 2007.  . Osteoporosis   . PAF (paroxysmal atrial fibrillation) ([facility]   a.  h/o brief episodes noted on ppm interrogation. b. given GI bleeding and need to be on both ASA 81 and Brilinta, risks likely outweigh benefits of anticoagulation.   . Small bowel obstruction ([facility])    Recurrent; resolved after resection of a lipoma  . Syncope    a. H/o recurrent syncope with pauses on loop recorder s/p Medtronic pacemaker 07/2012.    PAST SURGICAL HISTORY:   Past Surgical History:  Procedure  Laterality Date  .  AORTIC VALVE REPLACEMENT  2011   Aortic valve replacement surgery with a 19 mm bioprosthetic device post redo CABG January 2011.  Marland Kitchen BONE MARROW BIOPSY    . BREAST EXCISIONAL BIOPSY     benign, bilateral  . CERVICAL BIOPSY    . COLONOSCOPY     Multiple with adenomatous polyps  . COLONOSCOPY  06/23/2012   Procedure: COLONOSCOPY;  Surgeon: Gatha Mayer, MD;  Location: [facility];  Service: Endoscopy;  Laterality: N/A;  . CORONARY ARTERY BYPASS GRAFT  2005, 2011   CABG-most recent SVG to RCA-12/05; RCA occluded with patent graft in 2006; Redo bypass in 07/2009  . ESOPHAGOGASTRODUODENOSCOPY    . ESOPHAGOGASTRODUODENOSCOPY  06/23/2012   Procedure: ESOPHAGOGASTRODUODENOSCOPY (EGD);  Surgeon: Gatha Mayer, MD;  Location: [facility];  Service: Endoscopy;  Laterality: N/A;  . ESOPHAGOGASTRODUODENOSCOPY N/A 04/06/2013   Procedure: ESOPHAGOGASTRODUODENOSCOPY (EGD);  Surgeon: Irene Shipper, MD;  [facility]COPY;  Service: Endoscopy;  Laterality: N/A;  . EXPLORATORY LAPAROTOMY W/ BOWEL RESECTION     Small bowel resection of lipoma  . GIVENS CAPSULE STUDY N/A 04/30/2013   Procedure: GIVENS CAPSULE STUDY;  Surgeon: Gatha Mayer, MD;  Location: [place];  Service: Endoscopy;  Laterality: N/A;  . LEFT AND RIGHT HEART CATHETERIZATION WITH CORONARY ANGIOGRAM N/A 04/07/2013   Procedure: LEFT AND RIGHT HEART CATHETERIZATION WITH CORONARY ANGIOGRAM;  Surgeon: Larey Dresser, MD;  Location: [facility] CATH LAB;  Service: Cardiovascular;  Laterality: N/A;  . LEFT ATRIAL APPENDAGE OCCLUSION N/A 02/16/2016   Watchman not placed - nickel allergy  . LEFT HEART CATHETERIZATION WITH CORONARY/GRAFT ANGIOGRAM N/A 06/07/2014   Procedure: LEFT HEART CATHETERIZATION WITH Beatrix Fetters;  Surgeon: Burnell Blanks, MD;  Location: [facility] CATH LAB;  Service: Cardiovascular;  Laterality: N/A;  . LIVER BIOPSY    . LOOP RECORDER IMPLANT N/A 12/10/2011   Procedure: LOOP RECORDER IMPLANT;   Surgeon: Evans Lance, MD;  [facility] C[facility] LAB;  Service: Cardiovascular;  Laterality: N/A;  . MALONEY DILATION  06/23/2012   Procedure: [name] DILATION;  Surgeon: Gatha Mayer, MD;  Location: [facility] ENDOSCOPY;  Service: Endoscopy;;  54 fr  . MASTECTOMY Left 05/17/2015   total   . NEPHRECTOMY  2008   Laparoscopic right nephrectomy, renal cell carcinoma  . PACEMAKER INSERTION  08/21/2012   Medtronic Adapta L dual-chamber pacemaker  . PERCUTANEOUS CORONARY STENT INTERVENTION (PCI-S) N/A 04/09/2013   Procedure: PERCUTANEOUS CORONARY STENT INTERVENTION (PCI-S);  Surgeon: Burnell Blanks, MD;  Location: [facility] CATH LAB;  Service: Cardiovascular;  Laterality: N/A;  . PERMANENT PACEMAKER INSERTION N/A 08/21/2012   Procedure: PERMANENT PACEMAKER INSERTION;  Surgeon: Evans Lance, MD;  Location: [facility] CATH LAB;  Service: Cardiovascular;  Laterality: N/A;  . TEE WITHOUT CARDIOVERSION N/A 12/07/2015   Procedure: TRANSESOPHAGEAL ECHOCARDIOGRAM (TEE);  Surgeon: Larey Dresser, MD;  Location: [place];  Service: Cardiovascular;  Laterality: N/A;  . TOTAL ABDOMINAL HYSTERECTOMY W/ BILATERAL SALPINGOOPHORECTOMY  2000  . TOTAL MASTECTOMY Left 05/17/2015   Procedure: LEFT TOTAL MASTECTOMY;  Surgeon: Rolm Bookbinder, MD;  Location: [place];  Service: General;  Laterality: Left;  . TUMOR EXCISION  1981   removal of Hodgkins lymphoma    SOCIAL HISTORY:   Social History   Social History  . Marital status: Married    Spouse name: N/A  . Number of children: 2  . Years of education: N/A   Occupational History  . retired     Equities trader principal   Social History Main Topics  .  Smoking status: Never Smoker  . Smokeless tobacco: Never Used  . Alcohol use No  . Drug use: No  . Sexual activity: Yes   Other Topics Concern  . 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  Mother Deceased  . Father Deceased  . Sister Deceased  . Sister Deceased  . Maternal Grandmother Deceased  . Maternal Grandfather Deceased  . Paternal Grandmother Deceased  . Paternal Grandfather Deceased  . Brother Alive  . Neg Hx     ROS:  A complete 10 system review of systems was obtained and was unremarkable apart from what is mentioned above.  PHYSICAL EXAMINATION:    VITALS:   Vitals:   11/13/16 0927  BP: 110/68  Pulse: 84  SpO2: 98%  Weight: 123 lb (55.8 kg)  Height: 5' 2.5" (1.588 m)    GEN:  Normal appears female in no acute distress.  Appears stated age. HEENT:  Normocephalic, atraumatic. The mucous membranes are moist. The superficial temporal arteries are without ropiness or tenderness. Cardiovascular: Regular rate and rhythm. Lungs: Clear to auscultation bilaterally. Neck/Heme: There are no carotid bruits noted bilaterally.  NEUROLOGICAL: Orientation:  The patient is alert and oriented x 3.  Fund of knowledge is appropriate.  Recent and remote memory intact.  Attention span and concentration normal.  Repeats and names without difficulty. Cranial nerves: There is good facial symmetry. The pupils are equal round and reactive to light bilaterally. Fundoscopic exam reveals clear disc margins bilaterally. Extraocular muscles are intact and visual fields are full to confrontational testing. Speech is fluent and clear. Soft palate rises symmetrically and there is no tongue deviation. Hearing is intact to conversational tone. Tone: Tone is good throughout. Sensation: Sensation is intact to light touch and pinprick throughout (facial, trunk, extremities). Pinprick is slightly decreased on the right face compared to the left face (only LOWER face) but patient reports that they are very similar and not much different.   Vibration is absent at the bilateral big toe but intact at the bilateral ankle. There is no extinction with double simultaneous stimulation. There is no sensory  dermatomal level identified. Coordination:  The patient has no difficulty with RAM's or FNF bilaterally. Motor: Strength is 5/5 in the bilateral upper and lower extremities.  Shoulder shrug is equal and symmetric. There is no pronator drift.  There are no fasciculations noted. DTR's: Deep tendon reflexes are 2-/4 at the bilateral biceps, triceps, brachioradialis, patella and trace at the bilateral achilles.  Plantar responses are downgoing bilaterally. Gait and Station: The patient is able to ambulate without difficulty. The patient is able to heel toe walk without any difficulty. The patient is able to ambulate in a tandem fashion. The patient is able to stand in the Romberg position.  Lab Results  Component Value Date   CHOL 125 04/27/2016   HDL 43 (L) 04/27/2016   LDLCALC 42 04/27/2016   LDLDIRECT 43.0 12/14/2014   TRIG 199 (H) 04/27/2016   CHOLHDL 2.9 04/27/2016      IMPRESSION/PLAN  1. Episode of slurred speech  -It is somewhat difficult to assess now, one month later, what this represented.  It did not last very long. This could have represented TIA.  It also could have represented seizure.  She believes she had a-fib with the episode.    -told her to call  911 should have similar in future  -She just had a carotid ultrasound in March, 2018 that was normal.  -She is already on Brilinta and at this point I would not change or add anything to that  -I would do a CT of the brain as her PPM is not MRI compatible and EEG (routine and if neg, 24 hour).  Not able to have contrast on CT due to iodine allergy.  -Talked about stroke risk factors which include a-fib,  HTN, hyperlipidemia, CAD, DM.  Talked about those risk factors which are modifiable.  -Talked about importance of blood pressure control with a goal <130/80 mm Hg.   -Talked about importance of lipid control and proper diet.  Lipids should be managed intensively, with a goal LDL < 70 mg/dL.  she is on a statin (Pravachol) and last  LDL was 42.  -I counseled the patient on measures to reduce stroke risk, including the importance of medication compliance, risk factor control, exercise, healthy diet, and avoidance of smoking.    2.  Memory Loss  -don't see evidence of a neurodegenerative dementia and suspect possible MCI  -talked about neurocognitive testing.  She would like to schedule this.    Had NP from insurance company give her MMSE and discussed with her that given she has a doctorate, this is likely not a valid way to test her for memory loss right now.    3.  f/u as needed.  Much greater than 50% of this visit was spent in counseling and coordinating care.  Total face to face time:  60 min     Cc:  Purvis Kilts, MD

## 2016-11-12 NOTE — Procedures (Signed)
   Patient Name: [name], [name] [name] Date: 11/06/2016 Gender: Female D.O.B: 02/21/1951 Age (years): 65 Referring Provider: [name] Height (inches): 40 Interpreting Physician: Fransico Him MD, ABSM Weight (lbs): 120 RPSGT: Carolin Coy BMI: 21 MRN: [id number] Neck Size: 12.00  CLINICAL INFORMATION Sleep Study Type: NPSG  Indication for sleep study: Congestive Heart Failure, Diabetes, Fatigue, Hypertension  Epworth Sleepiness Score: 10  SLEEP STUDY TECHNIQUE As per the AASM Manual for the Scoring of Sleep and Associated Events v2.3 (April 2016) with a hypopnea requiring 4% desaturations.  The channels recorded and monitored were frontal, central and occipital EEG, electrooculogram (EOG), submentalis EMG (chin), nasal and oral airflow, thoracic and abdominal wall motion, anterior tibialis EMG, snore microphone, electrocardiogram, and pulse oximetry.  MEDICATIONS Medications self-administered by patient taken the night of the study : XANAX, APIDRA, ASPIRIN EC, BRILINTA, COLCHICINE, ISOSORBIDE MONONITRATE, METOPROLOL, POTASSIUM CHLORIDE, PRAVASTATIN, TORSEMIDE, ULORIC  SLEEP ARCHITECTURE The study was initiated at 10:53:43 PM and ended at 4:56:20 AM.  Sleep onset time was 10.4 minutes and the sleep efficiency was 87.3%. The total sleep time was 316.5 minutes.  Stage REM latency was 135.0 minutes.  The patient spent 7.42% of the night in stage N1 sleep, 77.25% in stage N2 sleep, 0.16% in stage N3 and 15.17% in REM.  Alpha intrusion was absent.  Supine sleep was 26.54%.  RESPIRATORY PARAMETERS The overall apnea/hypopnea index (AHI) was 22.2 per hour. There were 4 total apneas, including 0 obstructive, 4 central and 0 mixed apneas. There were 113 hypopneas and 16 RERAs.  The AHI during Stage REM sleep was 18.8 per hour.  AHI while supine was 10.7 per hour.  The mean oxygen saturation was 91.96%. The minimum SpO2 during sleep was 85.00%.  Soft snoring was  noted during this study.  CARDIAC DATA The 2 lead EKG demonstrated sinus rhythm. The mean heart rate was 87.23 beats per minute. Other EKG findings include: PVCs.  LEG MOVEMENT DATA The total PLMS were 0 with a resulting PLMS index of 0.00. Associated arousal with leg movement index was 0.0 .  IMPRESSIONS - Moderate obstructive sleep apnea occurred during this study (AHI = 22.2/h). - No significant central sleep apnea occurred during this study (CAI = 0.8/h). - Mild oxygen desaturation was noted during this study (Min O2 = 85.00%). - The patient snored with Soft snoring volume. - EKG findings include PVCs. - Clinically significant periodic limb movements did not occur during sleep. No significant associated arousals.  DIAGNOSIS - Obstructive Sleep Apnea (327.23 [G47.33 ICD-10])  RECOMMENDATIONS - Therapeutic CPAP titration to determine optimal pressure required to alleviate sleep disordered breathing. - Avoid alcohol, sedatives and other CNS depressants that may worsen sleep apnea and disrupt normal sleep architecture. - Sleep hygiene should be reviewed to assess factors that may improve sleep quality. - Weight management and regular exercise should be initiated or continued if appropriate.   [place], American Board of Sleep Medicine  ELECTRONICALLY SIGNED ON:  11/12/2016, 12:27 PM [place] PH: [phone]   FX: [phone] [place]

## 2016-11-13 ENCOUNTER — Encounter: Payer: Self-pay | Admitting: Neurology

## 2016-11-13 ENCOUNTER — Telehealth: Payer: Self-pay | Admitting: *Deleted

## 2016-11-13 ENCOUNTER — Ambulatory Visit (INDEPENDENT_AMBULATORY_CARE_PROVIDER_SITE_OTHER): Payer: Medicare Other | Admitting: Neurology

## 2016-11-13 VITALS — BP 110/68 | HR 84 | Ht 62.5 in | Wt 123.0 lb

## 2016-11-13 DIAGNOSIS — R4702 Dysphasia: Secondary | ICD-10-CM

## 2016-11-13 DIAGNOSIS — R413 Other amnesia: Secondary | ICD-10-CM

## 2016-11-13 DIAGNOSIS — R471 Dysarthria and anarthria: Secondary | ICD-10-CM

## 2016-11-13 DIAGNOSIS — G4733 Obstructive sleep apnea (adult) (pediatric): Secondary | ICD-10-CM

## 2016-11-13 NOTE — Telephone Encounter (Signed)
-----   Message from Sueanne Margarita, MD sent at 11/12/2016 12:29 PM EDT ----- Please let patient know that they have sleep apnea and recommend CPAP titration. Please set up titration in the sleep lab.

## 2016-11-13 NOTE — Patient Instructions (Addendum)
1. We will schedule neurocognitive testing.   2. We will schedule EEG and if normal, will bring you back for a 24 hour EEG.   3. We have scheduled you at [place] for your scan on 11/15/16 at 3:30pm. Please arrive 15 minutes prior and go to [place]. If you need to reschedule for any reason please call [phone].

## 2016-11-13 NOTE — Telephone Encounter (Signed)
Informed patient of her results and recommendation and she verbalized understanding.  She understands she will be set up in lab for her titration at [facility] sleep center. She understands she will be contacted by mail of a time and a date. She understands to call if she does not receive a letter in a timely manner. Titration ordered for scheduling

## 2016-11-15 ENCOUNTER — Ambulatory Visit (INDEPENDENT_AMBULATORY_CARE_PROVIDER_SITE_OTHER)
Admission: RE | Admit: 2016-11-15 | Discharge: 2016-11-15 | Disposition: A | Discharge status: Home / Self Care | Payer: Medicare Other | Source: Ambulatory Visit | Attending: Neurology | Admitting: Neurology

## 2016-11-15 ENCOUNTER — Telehealth: Payer: Self-pay | Admitting: Neurology

## 2016-11-15 ENCOUNTER — Ambulatory Visit (INDEPENDENT_AMBULATORY_CARE_PROVIDER_SITE_OTHER): Payer: Medicare Other | Admitting: Neurology

## 2016-11-15 DIAGNOSIS — R471 Dysarthria and anarthria: Secondary | ICD-10-CM | POA: Diagnosis not present

## 2016-11-15 DIAGNOSIS — R413 Other amnesia: Secondary | ICD-10-CM

## 2016-11-15 DIAGNOSIS — R4702 Dysphasia: Secondary | ICD-10-CM

## 2016-11-15 DIAGNOSIS — R404 Transient alteration of awareness: Secondary | ICD-10-CM

## 2016-11-15 NOTE — Telephone Encounter (Signed)
-----   Message from [place], DO sent at 11/15/2016 12:37 PM EDT ----- Let pt know that EEG normal.  Proceed with ambulatory if indicated in notes

## 2016-11-15 NOTE — Procedures (Signed)
TECHNICAL SUMMARY:  A multichannel referential and bipolar montage EEG using the standard international 10-20 system was performed on the patient described as awake, drowsy and asleep.  The dominant background activity consists of 11 hertz activity seen most prominantly over the posterior head region.    Low voltage fast (beta) activity is distributed symmetrically and maximally over the anterior head regions.  ACTIVATION:  Stepwise photic stimulation at 4-20 flashes per second was performed and did not elicit any abnormal waveforms.  Hyperventilation was performed for 3 minutes with good patient effort and produced no changes in the background activity.  EPILEPTIFORM ACTIVITY:  There were no spikes, sharp waves or paroxysmal activity.  SLEEP:  Both stage I and stage II sleep architecture are identified.  CARDIAC:  The EKG lead revealed a regular sinus rhythm.  IMPRESSION:  This is a normal EEG for the patients stated age.  There were no focal, hemispheric or lateralizing features.  No epileptiform activity was recorded.  A normal EEG does not exclude the diagnosis of a seizure disorder and if seizure remains high on the list of differential diagnosis, an ambulatory EEG may be of value.  Clinical correlation is required.

## 2016-11-15 NOTE — Telephone Encounter (Signed)
Mychart message sent to patient.  Message sent to Manuela Schwartz to schedule 24 hour EEG.

## 2016-11-16 ENCOUNTER — Other Ambulatory Visit (HOSPITAL_COMMUNITY): Payer: Self-pay | Admitting: Cardiology

## 2016-11-16 ENCOUNTER — Telehealth (HOSPITAL_COMMUNITY): Payer: Self-pay | Admitting: *Deleted

## 2016-11-16 ENCOUNTER — Telehealth: Payer: Self-pay | Admitting: Neurology

## 2016-11-16 NOTE — Telephone Encounter (Signed)
Mychart message sent to patient.

## 2016-11-16 NOTE — Telephone Encounter (Signed)
-----   Message from [place], DO sent at 11/15/2016  4:25 PM EDT ----- Tell patient that CT brain looks okay

## 2016-11-16 NOTE — Telephone Encounter (Signed)
Called pt to schedule soon appt. No answer left VM for pt to call back

## 2016-11-16 NOTE — Telephone Encounter (Signed)
Pt called concerned about her recent head CT. I told her neuro would read and call her with results. Pt wants Dr.McLean to also take a look and see if she needs to be seen in our clinic before her scheduled appt in May.   Message routed to [place] for further review.

## 2016-11-16 NOTE — Telephone Encounter (Signed)
CT head was unremarkable.  She may have had a TIA related to atrial fibrillation, not anticoagulated with history of GI bleeding and .  Her hemoglobin has been stable.  One option would be to try her on low dose Xarelto 15 mg daily, continue lower dose ticagrelor, and stop ASA 81.  However, there would be higher risk of rebleeding.  Will need to revisit Watchman with EP.  Would go ahead and make appt for her to discuss this with me.

## 2016-11-19 ENCOUNTER — Encounter: Payer: Self-pay | Admitting: *Deleted

## 2016-11-19 ENCOUNTER — Ambulatory Visit (INDEPENDENT_AMBULATORY_CARE_PROVIDER_SITE_OTHER): Payer: Medicare Other | Admitting: Internal Medicine

## 2016-11-19 ENCOUNTER — Encounter: Payer: Self-pay | Admitting: Internal Medicine

## 2016-11-19 DIAGNOSIS — I495 Sick sinus syndrome: Secondary | ICD-10-CM

## 2016-11-19 LAB — CUP PACEART INCLINIC DEVICE CHECK
Battery Impedance: 209 Ohm
Battery Remaining Longevity: 145 mo
Battery Voltage: 2.81 V
Brady Statistic AS VS Percent: 100 %
Date Time Interrogation Session: 20180430090630
Implantable Lead Implant Date: 20140130
Implantable Lead Location: 753859
Lead Channel Pacing Threshold Amplitude: 0.75 V
Lead Channel Pacing Threshold Pulse Width: 0.4 ms
Lead Channel Pacing Threshold Pulse Width: 0.4 ms
Lead Channel Pacing Threshold Pulse Width: 0.4 ms
MDC IDC LEAD IMPLANT DT: 20140130
MDC IDC LEAD LOCATION: 753860
MDC IDC MSMT LEADCHNL RA IMPEDANCE VALUE: 461 Ohm
MDC IDC MSMT LEADCHNL RA PACING THRESHOLD AMPLITUDE: 0.875 V
MDC IDC MSMT LEADCHNL RA SENSING INTR AMPL: 2.8 mV
MDC IDC MSMT LEADCHNL RV IMPEDANCE VALUE: 600 Ohm
MDC IDC MSMT LEADCHNL RV PACING THRESHOLD AMPLITUDE: 0.625 V
MDC IDC MSMT LEADCHNL RV PACING THRESHOLD AMPLITUDE: 0.75 V
MDC IDC MSMT LEADCHNL RV PACING THRESHOLD PULSEWIDTH: 0.4 ms
MDC IDC MSMT LEADCHNL RV SENSING INTR AMPL: 22.4 mV
MDC IDC PG IMPLANT DT: 20140130
MDC IDC SET LEADCHNL RA PACING AMPLITUDE: 1.75 V
MDC IDC SET LEADCHNL RV PACING AMPLITUDE: 2.5 V
MDC IDC SET LEADCHNL RV PACING PULSEWIDTH: 0.4 ms
MDC IDC SET LEADCHNL RV SENSING SENSITIVITY: 5.6 mV
MDC IDC STAT BRADY AP VP PERCENT: 0 %
MDC IDC STAT BRADY AP VS PERCENT: 0 %
MDC IDC STAT BRADY AS VP PERCENT: 0 %

## 2016-11-19 NOTE — Progress Notes (Signed)
HPI [name] [name] returns today for followup. She is a very pleasant 66 year old woman with multiple medical problems including mantle radiation for Hodgkin's disease, and subsequent coronary artery disease status post bypass grafting. She develop recurrent chest pain and underwent catheterization several months ago demonstrating left main stenosis and underwent stenting of the left main and left anterior descending coronary arteries. 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 She has had breast surgery for breast CA. She has not had syncope. She denies medication noncompliance. No peripheral edema. No anginal symptoms. She has a nodule on her right leg which has been present for a week or two. It is tender. No claudication symptoms. She had a TIA. She has PAF but is not able to take an [place] or warfarin as she has chronic bleeding on these meds. She could not have a watchman placed due to a nickle allergy. Allergies  Allergen Reactions  . Avandia [Rosiglitazone Maleate] Other (See Comments)    Congestive heart failure  . Cephalexin Anaphylaxis, Swelling and Rash    Extremities swell , throat swelling   . Clindamycin Hcl Anaphylaxis and Shortness Of Breath  . Lincomycin Anaphylaxis and Shortness Of Breath  . Metformin Other (See Comments)    ? Congestive heart failure ?  Marland Kitchen Novolog [Insulin Aspart] Hives and Itching    Humalog & Novolog big knots and whelp    . Penicillins Anaphylaxis, Hives, Swelling and Rash    Has patient had a PCN reaction causing immediate rash, facial/tongue/throat swelling, SOB or lightheadedness with hypotension: Yes Has patient had a PCN reaction causing severe rash involving mucus membranes or skin necrosis: No Has patient had a PCN reaction that required hospitalization No Has patient had a PCN reaction occurring within the last 10 years: No If all of the above answers are "NO", then may proceed with Cephalosporin use.   .  Tizanidine Other (See Comments)    Dizziness, Mental Status Changes, Hallucination    . Adhesive [Tape] Other (See Comments)    Tears skin, Please use "paper" tape  . Atorvastatin Other (See Comments)    increased LFT's, no muscle weakness, leg pain Muscle and joint pain increased LFT's   . Cholestyramine Other (See Comments)    Liver Disorder Elevated LFTs   . Crestor [Rosuvastatin] Nausea And Vomiting, Nausea Only and Other (See Comments)    Muscle and joint pain & increased LFTs; tolerates Pravastatin 63m (max)  . Gentamycin [Gentamicin] Hives, Itching and Rash    Fine red bumps.  Reaction noted post loop recorder implant, where gentamycin was used for irrigation. Topical prep  . Humalog [Insulin Lispro] Hives and Itching    Pt uses Apidra   . Iodinated Diagnostic Agents Other (See Comments)    PAIN DURING LYMPHANGIOGRAM '81, NO ALLERGY TO IV CONTRAST. PAIN DURING LYMPHANGIOGRAM '86, NO ALLERGY TO IV CONTRAST  . Limonene Hives, Itching and Rash    Reaction noted post loop recorder implant, where gentamycin was used for irrigation. Topical prep  . Pravastatin Other (See Comments)    In high doses (20-429m causes muscle and joint pain   . Prednisone Itching    B/P went high, itching all over   . Simvastatin Other (See Comments)    Increased LFT's  . Strawberry Extract Hives, Itching and Rash    Reaction noted post loop recorder implant, where gentamycin was used for irrigation. Topical prep  . Plavix [Clopidogrel Bisulfate] Other (See Comments)  P2Y12 testing = 271 while on Plavix  . Codeine Nausea And Vomiting  . Erythromycin Nausea And Vomiting  . Erythromycin Base Nausea And Vomiting  . Hydrocodone-Acetaminophen Itching and Rash    itching  . Latex Itching    I"if wear gloves hands get red ,itchy no prioblem if other wear and touch me"  . Neomycin Rash    Redness  . Nickel Itching  . Ranitidine Nausea Only  . Tamiflu [Oseltamivir] Nausea And Vomiting  . Tramadol  Nausea Only     Current Outpatient Prescriptions  Medication Sig Dispense Refill  . Alirocumab (PRALUENT [place]) Inject 150 mLs into the skin every 14 (fourteen) days.    . ALPRAZolam (XANAX) 0.5 MG tablet Take 0.5 mg by mouth at bedtime. May take additional dose daily for anxiety    . amLODipine (NORVASC) 5 MG tablet Take 1 tablet (5 mg total) by mouth daily. 90 tablet 3  . APIDRA 100 UNIT/ML injection INFUSE THROUGH INSULIN PUMP UTD. Has been receiving 82-125 units daily over last 10 days.  Asked pt to bring with her on admission 10/25  2  . aspirin EC 81 MG tablet Take 81 mg by mouth at bedtime.    Marland Kitchen azithromycin (ZITHROMAX) 500 MG tablet 1 tablet by mouth one hour prior to dental appointment/procedure 2 tablet 0  . BRILINTA 60 MG TABS tablet TAKE 1 TABLET(60 MG) BY MOUTH TWICE DAILY 180 tablet 2  . Calcium Carbonate-Vitamin D (RA CALCIUM PLUS VITAMIN D) 600-400 MG-UNIT per tablet Take 1 tablet by mouth daily.     . Cholecalciferol 2000 UNITS TABS Take 1 tablet by mouth daily.    . clobetasol (TEMOVATE) 0.05 % external solution Apply 1 application topically daily as needed (for hives). Reported on 12/05/2015    . colchicine 0.6 MG tablet Take 0.6 mg by mouth daily. And,Take 1 tablet at onset and repeate in 1 hour and then 1 daily x 2 (FOR FLARES)    . Fe Fum-FA-B Cmp-C-Zn-Mg-Mn-Cu (HEMATINIC PLUS COMPLEX) 106-1 MG TABS Take 1 tablet oraly three times a week or as directed daily with with a Vitamin C tablet 60 each 0  . glucagon (GLUCAGON EMERGENCY) 1 MG injection Inject 1 mg into the vein once as needed (for severe reaction). Reported on 12/05/2015    . isosorbide mononitrate (IMDUR) 60 MG 24 hr tablet TAKE 1 TABLET BY MOUTH ONCE DAILY 90 tablet 3  . Magnesium Oxide 250 MG TABS Take 250 mg by mouth daily.    . metoprolol (LOPRESSOR) 50 MG tablet TAKE 1 AND 1/2 TABLETS(75 MG) BY MOUTH TWICE DAILY 270 tablet 3  . Multiple Vitamins-Minerals (MULTIVITAMIN WITH MINERALS) tablet Take 1 tablet by mouth  daily.      . nitroGLYCERIN (NITROLINGUAL) 0.4 MG/SPRAY spray Place 1 spray under the tongue every 5 (five) minutes as needed for chest pain. 12 g 1  . Omega-3 Fatty Acids (FISH OIL) 1000 MG CAPS Take by mouth.    Marland Kitchen omeprazole (PRILOSEC) 20 MG capsule Take 20 mg by mouth every other day.     . ONE TOUCH ULTRA TEST test strip 1 each by Other route as needed for other.     . potassium chloride (K-DUR,KLOR-CON) 10 MEQ tablet Take 6 tablets (60 mEq total) by mouth 2 (two) times daily. 240 tablet 3  . pravastatin (PRAVACHOL) 10 MG tablet TAKE 1 TABLET BY MOUTH ONCE DAILY 90 tablet 3  . Probiotic Product (ALIGN) 4 MG CAPS Take by mouth.    Marland Kitchen  torsemide (DEMADEX) 20 MG tablet Take 2 tablets (40 mg total) by mouth 2 (two) times daily. 120 tablet 3  . ULORIC 40 MG tablet Take 40 mg by mouth daily.  11  . vitamin C (ASCORBIC ACID) 250 MG tablet Take 250 mg by mouth 2 (two) times daily.     No current facility-administered medications for this visit.      Past Medical History:  Diagnosis Date  . Anxiety   . Aortic stenosis    a. bicuspid aortic valve; mean gradient of 20 mmHg in 2/10; b. s/p bioprosth AVR (19-mm Edwards pericardial valve) with a redo coronary artery bypass graft procedure in 07/2009; postoperative Dressler's syndrome    . Arthritis   . Carcinoma, renal cell ([place]) 05/2007   Laparoscopic right nephrectomy  . Chronic diastolic CHF (congestive heart failure) ([place])    a. 9.2014 EchoP EF 60-65%, no rwma, bioprosth AVR, mean gradient of 58mHg, mild to mod MR, PASP 530mg.  . CKD (chronic kidney disease), stage II   . Colitis, ischemic ([facility]2008  . Coronary artery disease    a. initial CABG with SVG-RCA in 12/05. b. redo SVG-RCA and bioprosthetic AVR in 1/11. d. 9/14 - PCI with DES to distal LM/proximal LAD was done rather than bypass as she would have been a poor candidate for redo sternotomy. d. S/p DES to prox LM 05/2014.  . Diabetes mellitus 03/2010    TYPE II: Hemoglobin A1c of 7.4  in  03/2010; 8.4 in 06/2010; treated with insulin pump  . Duodenal ulcer    Remote; H. pylori positive  . Gastroparesis due to DM ([facility]10/04/2012  . Hodgkin's disease ([facility]1991   M[place]le radiation therapy  . Hyperlipidemia   . Hyperplastic gastric polyp 06/23/2012  . Hypertension   . Iron deficiency anemia    a. 03/2013 EGD: essentially normal. Possible slow GIB.  . Migraines   . NASH (nonalcoholic steatohepatitis) 1999   -biopsy in 1999  . Osteopenia     hip on DEXA in October 2007.  . Osteoporosis   . PAF (paroxysmal atrial fibrillation) ([facility]   a.  h/o brief episodes noted on ppm interrogation. b. given GI bleeding and need to be on both ASA 81 and Brilinta, risks likely outweigh benefits of anticoagulation.   . Small bowel obstruction ([facility])    Recurrent; resolved after resection of a lipoma  . Syncope    a. H/o recurrent syncope with pauses on loop recorder s/p Medtronic pacemaker 07/2012.    ROS:   All systems reviewed and negative except as noted in the HPI.   Past Surgical History:  Procedure Laterality Date  . AORTIC VALVE REPLACEMENT  2011   Aortic valve replacement surgery with a 19 mm bioprosthetic device post redo CABG January 2011.  . Marland KitchenONE MARROW BIOPSY    . BREAST EXCISIONAL BIOPSY     benign, bilateral  . CERVICAL BIOPSY    . COLONOSCOPY     Multiple with adenomatous polyps  . COLONOSCOPY  06/23/2012   Procedure: COLONOSCOPY;  Surgeon: CaGatha MayerMD;  Location: [facility] ENDOSCOPY;  Service: Endoscopy;  Laterality: N/A;  . CORONARY ARTERY BYPASS GRAFT  2005, 2011   CABG-most recent SVG to RCA-12/05; RCA occluded with patent graft in 2006; Redo bypass in 07/2009  . ESOPHAGOGASTRODUODENOSCOPY    . ESOPHAGOGASTRODUODENOSCOPY  06/23/2012   Procedure: ESOPHAGOGASTRODUODENOSCOPY (EGD);  Surgeon: CaGatha MayerMD;  [facility];  Service: Endoscopy;  Laterality: N/A;  . ESOPHAGOGASTRODUODENOSCOPY  N/A 04/06/2013   Procedure: ESOPHAGOGASTRODUODENOSCOPY (EGD);   Surgeon: Irene Shipper, MD;  [facility] ENDOSCOPY;  Service: Endoscopy;  Laterality: N/A;  . EXPLORATORY LAPAROTOMY W/ BOWEL RESECTION     Small bowel resection of lipoma  . GIVENS CAPSULE STUDY N/A 04/30/2013   Procedure: GIVENS CAPSULE STUDY;  Surgeon: Gatha Mayer, MD;  Location: [facility];  Service: Endoscopy;  Laterality: N/A;  . LEFT AND RIGHT HEART CATHETERIZATION WITH CORONARY ANGIOGRAM N/A 04/07/2013   Procedure: LEFT AND RIGHT HEART CATHETERIZATION WITH CORONARY ANGIOGRAM;  Surgeon: Larey Dresser, MD;  Location[facility] CATH LAB;  Service: Cardiovascular;  Laterality: N/A;  . LEFT ATRIAL APPENDAGE OCCLUSION N/A 02/16/2016   Watchman not placed - nickel allergy  . LEFT HEART CATHETERIZATION WITH CORONARY/GRAFT ANGIOGRAM N/A 06/07/2014   Procedure: LEFT HEART CATHETERIZATION WITH Beatrix Fetters;  Surgeon: Burnell Blanks, MD;  Location: [facility] CATH LAB;  Service: Cardiovascular;  Laterality: N/A;  . LIVER BIOPSY    . LOOP RECORDER IMPLANT N/A 12/10/2011   Procedure: LOOP RECORDER IMPLANT;  Surgeon: Evans Lance, MD;  Location: [facility] CATH LAB;  Service: Cardiovascular;  Laterality: N/A;  . MALONEY DILATION  06/23/2012   Procedure: [name] DILATION;  Surgeon: Gatha Mayer, MD;  Location: [facility] ENDOSCOPY;  Service: Endoscopy;;  54 fr  . MASTECTOMY Left 05/17/2015   total   . NEPHRECTOMY  2008   Laparoscopic right nephrectomy, renal cell carcinoma  . PACEMAKER INSERTION  08/21/2012   Medtronic Adapta L dual-chamber pacemaker  . PERCUTANEOUS CORONARY STENT INTERVENTION (PCI-S) N/A 04/09/2013   Procedure: PERCUTANEOUS CORONARY STENT INTERVENTION (PCI-S);  Surgeon: Burnell Blanks, MD;  Location: [facility] C[facility] LAB;  Service: Cardiovascular;  Laterality: N/A;  . PERMANENT PACEMAKER INSERTION N/A 08/21/2012   Procedure: PERMANENT PACEMAKER INSERTION;  Surgeon: Evans Lance, MD;  Location: [facility] CATH LAB;  Service: Cardiovascular;  Laterality: N/A;  . TEE WITHOUT CARDIOVERSION N/A  12/07/2015   Procedure: TRANSESOPHAGEAL ECHOCARDIOGRAM (TEE);  Surgeon: Larey Dresser, MD;  Location: [place];  Service: Cardiovascular;  Laterality: N/A;  . TOTAL ABDOMINAL HYSTERECTOMY W/ BILATERAL SALPINGOOPHORECTOMY  2000  . TOTAL MASTECTOMY Left 05/17/2015   Procedure: LEFT TOTAL MASTECTOMY;  Surgeon: Rolm Bookbinder, MD;  Location: [place];  Service: General;  Laterality: Left;  . TUMOR EXCISION  1981   removal of Hodgkins lymphoma     Family History  Problem Relation Age of Onset  . Heart attack Mother   . Heart disease Mother   . Heart attack Father   . Diabetes Father   . Heart disease Father   . Lung cancer Sister     meta  . Breast cancer Sister     half sister (Paternal)  . Ovarian cancer Sister   . Cervical cancer Sister     half sister (maternal)  . Hypertension Brother   . Glaucoma Brother   . Colon cancer Neg Hx   . Stomach cancer Neg Hx      Social History   Social History  . Marital status: Married    Spouse name: N/A  . Number of children: 2  . Years of education: N/A   Occupational History  . retired     Equities trader principal   Social History Main Topics  . Smoking status: Never Smoker  . Smokeless tobacco: Never Used  . Alcohol use No  . Drug use: No  . Sexual activity: Yes   Other Topics Concern  . 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 HEART:  Regular rate rhythm, no murmurs, no rubs, no clicks Abd:  soft, positive bowel sounds, no organomegally, no rebound, no guarding Ext:  2 plus pulses, no edema, no cyanosis, no clubbing, 1 cm nodule on lateral aspect of  right lower leg. No erythma, no fluctuance, and no drainage. Skin:  No rashes no nodules Neuro:  CN II through XII intact, motor grossly intact  DEVICE - Normal DDD PM function Normal device function.  See PaceArt for details.   Assess/Plan: 1. PAF - she is maintaining NSR 99.7% of the time. She is at risk for a stroke but our options are minimal at this point for thromboembolic prevention. 2. CAD - she denies anginal symptoms at present. She will continue her ASA and Brilinta 3. PPM - her medtronic DDD PM is working normally. 4. Syncope - she has had none since her PPM insertion.  Mikle Bosworth.D.

## 2016-11-19 NOTE — Patient Instructions (Signed)
Your physician wants you to follow-up in: 1 Year with Dr. Lovena Le. You will receive a reminder letter in the mail two months in advance. If you don't receive a letter, please call our office to schedule the follow-up appointment.  Remote monitoring is used to monitor your Pacemaker of ICD from home. This monitoring reduces the number of office visits required to check your device to one time per year. It allows [name] to keep an eye on the functioning of your device to ensure it is working properly. You are scheduled for a device check from home on 02/18/17. You may send your transmission at any time that day. If you have a wireless device, the transmission will be sent automatically. After your physician reviews your transmission, you will receive a postcard with your next transmission date.  Your physician recommends that you continue on your current medications as directed. Please refer to the Current Medication list given to you today.  If you need a refill on your cardiac medications before your next appointment, please call your pharmacy.  Thank you for choosing [facility]!

## 2016-11-21 NOTE — Telephone Encounter (Signed)
Cpap titration scheduled for Tuesday January 15 2017. Letter has been sent to patient

## 2016-11-24 ENCOUNTER — Emergency Department (HOSPITAL_COMMUNITY)
Admission: EM | Admit: 2016-11-24 | Discharge: 2016-11-24 | Disposition: A | Discharge status: Home / Self Care | Payer: Medicare Other | Attending: Emergency Medicine | Admitting: Emergency Medicine

## 2016-11-24 ENCOUNTER — Emergency Department (HOSPITAL_BASED_OUTPATIENT_CLINIC_OR_DEPARTMENT_OTHER)
Admission: RE | Admit: 2016-11-24 | Discharge: 2016-11-24 | Disposition: A | Discharge status: Home / Self Care | Payer: Medicare Other | Source: Ambulatory Visit | Attending: Emergency Medicine | Admitting: Emergency Medicine

## 2016-11-24 ENCOUNTER — Encounter (HOSPITAL_COMMUNITY): Payer: Self-pay | Admitting: Emergency Medicine

## 2016-11-24 DIAGNOSIS — Z85528 Personal history of other malignant neoplasm of kidney: Secondary | ICD-10-CM | POA: Insufficient documentation

## 2016-11-24 DIAGNOSIS — Z79899 Other long term (current) drug therapy: Secondary | ICD-10-CM | POA: Insufficient documentation

## 2016-11-24 DIAGNOSIS — E1122 Type 2 diabetes mellitus with diabetic chronic kidney disease: Secondary | ICD-10-CM | POA: Diagnosis not present

## 2016-11-24 DIAGNOSIS — Z950 Presence of cardiac pacemaker: Secondary | ICD-10-CM | POA: Diagnosis not present

## 2016-11-24 DIAGNOSIS — Z91040 Latex allergy status: Secondary | ICD-10-CM | POA: Diagnosis not present

## 2016-11-24 DIAGNOSIS — I2510 Atherosclerotic heart disease of native coronary artery without angina pectoris: Secondary | ICD-10-CM | POA: Diagnosis not present

## 2016-11-24 DIAGNOSIS — M79604 Pain in right leg: Secondary | ICD-10-CM | POA: Diagnosis present

## 2016-11-24 DIAGNOSIS — I130 Hypertensive heart and chronic kidney disease with heart failure and stage 1 through stage 4 chronic kidney disease, or unspecified chronic kidney disease: Secondary | ICD-10-CM | POA: Insufficient documentation

## 2016-11-24 DIAGNOSIS — Z7982 Long term (current) use of aspirin: Secondary | ICD-10-CM | POA: Diagnosis not present

## 2016-11-24 DIAGNOSIS — N182 Chronic kidney disease, stage 2 (mild): Secondary | ICD-10-CM | POA: Diagnosis not present

## 2016-11-24 DIAGNOSIS — M79609 Pain in unspecified limb: Secondary | ICD-10-CM | POA: Diagnosis not present

## 2016-11-24 DIAGNOSIS — I8001 Phlebitis and thrombophlebitis of superficial vessels of right lower extremity: Secondary | ICD-10-CM | POA: Insufficient documentation

## 2016-11-24 DIAGNOSIS — I5032 Chronic diastolic (congestive) heart failure: Secondary | ICD-10-CM | POA: Insufficient documentation

## 2016-11-24 DIAGNOSIS — Z794 Long term (current) use of insulin: Secondary | ICD-10-CM | POA: Diagnosis not present

## 2016-11-24 NOTE — ED Provider Notes (Signed)
[place] DEPT Provider Note   CSN: [id number] Arrival date & time: 11/24/16  4008     History   Chief Complaint Chief Complaint  Patient presents with  . Leg Pain    HPI [name] [name] is a 66 y.o. female with history of diabetes, CHF, artificial valve, CABG, pacemaker, Hodgkin's disease, renal cell carcinoma, breast cancer who presents with a two-week history of right, lateral ankle pain, swelling, and color change. Patient states she began with a rash that was itchy. She saw her primary care provider who gave triamcinolone cream. Patient has intermittent swelling to her foot, worse to her being on her feet throughout the day. Patient reports her pain is worse with pressure and standing. She has used heating pad over the past 2 weeks with some relief of pain. Patient is on Proventil.  HPI  Past Medical History:  Diagnosis Date  . Anxiety   . Aortic stenosis    a. bicuspid aortic valve; mean gradient of 20 mmHg in 2/10; b. s/p bioprosth AVR (19-mm Edwards pericardial valve) with a redo coronary artery bypass graft procedure in 07/2009; postoperative Dressler's syndrome    . Arthritis   . Carcinoma, renal cell ([place]) 05/2007   Laparoscopic right nephrectomy  . Chronic diastolic CHF (congestive heart failure) ([place])    a. 9.2014 EchoP EF 60-65%, no rwma, bioprosth AVR, mean gradient of 17mHg, mild to mod MR, PASP 545mg.  . CKD (chronic kidney disease), stage II   . Colitis, ischemic (HCHemp[place]2008  . Coronary artery disease    a. initial CABG with SVG-RCA in 12/05. b. redo SVG-RCA and bioprosthetic AVR in 1/11. d. 9/14 - PCI with DES to distal LM/proximal LAD was done rather than bypass as she would have been a poor candidate for redo sternotomy. d. S/p DES to prox LM 05/2014.  . Diabetes mellitus 03/2010    TYPE II: Hemoglobin A1c of 7.4 in  03/2010; 8.4 in 06/2010; treated with insulin pump  . Duodenal ulcer    Remote; H. pylori positive  . Gastroparesis due to DM (HCW[facility] 05/01/2012  . Hodgkin's disease (HCRiverdale1991   [place] radiation therapy  . Hyperlipidemia   . Hyperplastic gastric polyp 06/23/2012  . Hypertension   . Iron deficiency anemia    a. 03/2013 EGD: essentially normal. Possible slow GIB.  . Migraines   . NASH (nonalcoholic steatohepatitis) 1999   -biopsy in 1999  . Osteopenia     hip on DEXA in October 2007.  . Osteoporosis   . PAF (paroxysmal atrial fibrillation) ([facility] [place]   a.  h/o brief episodes noted on ppm interrogation. b. given GI bleeding and need to be on both ASA 81 and Brilinta, risks likely outweigh benefits of anticoagulation.   . Small bowel obstruction (HCC)    Recurrent; resolved after resection of a lipoma  . Syncope    a. H/o recurrent syncope with pauses on loop recorder s/p Medtronic pacemaker 07/2012.    Patient Active Problem List   Diagnosis Date Noted  . S/P left mastectomy 07/29/2016  . Dense breast 12/11/2015  . Hx of iron deficiency anemia 12/11/2015  . Chronic anticoagulation 12/11/2015  . Breast cancer screening, high risk patient 12/11/2015  . Type 1 diabetes mellitus (HCIota04/27/2017  . 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 Hx of radiation therapy 04/23/2015  . Thrombocytopenia (HCWahpeton10/07/2014  . 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 DCIS (ductal carcinoma in situ) of breast  04/23/2015  . Osteopenia determined by x-ray 04/23/2015  . DM (diabetes mellitus) with complications ([place]) 37/62/8315  . Radiation therapy complication 17/61/6073  . History of nodular sclerosis Hodgkin's disease 10/27/2014  . Iron deficiency anemia due to chronic blood loss 10/27/2014  . Screening mammogram for high-risk patient 10/27/2014  . Insulin dependent type 1 diabetes mellitus ([place]) 10/07/2014  . Diabetic gastroparesis ([place]) 07/30/2014  . Claudication ([place]) 07/29/2014  . PAF (paroxysmal atrial fibrillation) ([place])   . CKD (chronic kidney disease), stage II   . Coronary artery  disease involving native coronary artery of native heart with unstable angina pectoris ([place])   . Type 2 diabetes mellitus with complication ([place])   . Abdominal bloating 06/09/2013  . Feeling bilious 06/09/2013  . Iron deficiency anemia 04/11/2013  . Unstable angina ([place]) 04/11/2013  . CAD (coronary artery disease) of artery bypass graft 04/03/2013  . Chronic diastolic CHF (congestive heart failure) ([place]) 04/03/2013  . Anemia 04/03/2013  . Arterial vascular disease 03/12/2013  . Aorta disorder ([place]) 03/12/2013  . Atrial fibrillation ([place]) 03/12/2013  . Artificial cardiac pacemaker 03/12/2013  . CCF (congestive cardiac failure) ([place]) 03/12/2013  . Chronic kidney disease (CKD), stage III (moderate) 03/12/2013  . Gastric reflux 03/12/2013  . 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 Big thyroid 03/12/2013  . H/O Hodgkin's lymphoma 03/12/2013  . BP (high blood pressure) 03/12/2013  . Fitting and adjustment of insulin pump 03/12/2013  . Disorder of bone and cartilage, unspecified 03/12/2013  . Aortic heart valve narrowing 03/12/2013  . Encounter for fitting or adjustment of insulin pump 03/12/2013  . Congestive heart failure ([place]) 03/12/2013  . Ischemic colitis ([place]) 03/12/2013  . Lipoma of neck 09/23/2012  . Pacemaker 09/08/2012  . Chronic kidney disease, stage 3/cardiorenal syndrome 08/15/2012  . Atrial flutter ([place]) 07/23/2012  . Sick sinus syndrome ([place]) 07/21/2012  . Hyperplastic gastric polyp 06/23/2012  . Gastroparesis due to DM ([place]) 05/01/2012  . Hypercalcemia   . Cholelithiasis 07/20/2011  . Aortic stenosis status post 19 mm Edwards pericardial valve replacement 2011   . Arteriosclerotic cardiovascular disease (ASCVD)   . Osteopenia   . Anemia, normocytic normochromic   . NASH (nonalcoholic steatohepatitis)   . Renal cell carcinoma (HCC)     Class: History of  . Duodenal ulcer     Class: History of  . Ischemic colitis     Class: History of  . Gastroesophageal reflux disease   .  Hodgkin's disease ([place])   . Hypertension   . Small bowel obstruction ([place])   . Hyperlipidemia 04/11/2010  . Diabetes mellitus type 2, controlled ([place]) 03/23/2010  . Colonic polyps, adenomatous 09/29/2009    Past Surgical History:  Procedure Laterality Date  . AORTIC VALVE REPLACEMENT  2011   Aortic valve replacement surgery with a 19 mm bioprosthetic device post redo CABG January 2011.  Marland Kitchen BONE MARROW BIOPSY    . BREAST EXCISIONAL BIOPSY     benign, bilateral  . CERVICAL BIOPSY    . COLONOSCOPY     Multiple with adenomatous polyps  . COLONOSCOPY  06/23/2012   Procedure: COLONOSCOPY;  Surgeon: Gatha Mayer, MD;  Location: [facility] ENDOSCOPY;  Service: Endoscopy;  Laterality: N/A;  . CORONARY ARTERY BYPASS GRAFT  2005, 2011   CABG-most recent SVG to RCA-12/05; RCA occluded with patent graft in 2006; Redo bypass in 07/2009  . ESOPHAGOGASTRODUODENOSCOPY    . ESOPHAGOGASTRODUODENOSCOPY  06/23/2012   Procedure: ESOPHAGOGASTRODUODENOSCOPY (EGD);  Surgeon: Gatha Mayer, MD;  Location: [facility] ENDOSCOPY;  Service: Endoscopy;  Laterality: N/A;  . ESOPHAGOGASTRODUODENOSCOPY N/A 04/06/2013   Procedure: ESOPHAGOGASTRODUODENOSCOPY (EGD);  Surgeon: Irene Shipper, MD;  Location: [facility] ENDOSCOPY;  Service: Endoscopy;  Laterality: N/A;  . EXPLORATORY LAPAROTOMY W/ BOWEL RESECTION     Small bowel resection of lipoma  . GIVENS CAPSULE STUDY N/A 04/30/2013   Procedure: GIVENS CAPSULE STUDY;  Surgeon: Gatha Mayer, MD;  Location: [place] [facility];  Service: Endoscopy;  Laterality: N/A;  . LEFT AND RIGHT HEART CATHETERIZATION WITH CORONARY ANGIOGRAM N/A 04/07/2013   Procedure: LEFT AND RIGHT HEART CATHETERIZATION WITH CORONARY ANGIOGRAM;  Surgeon: Larey Dresser, MD;  Location: [facility] CATH LAB;  Service: Cardiovascular;  Laterality: N/A;  . LEFT ATRIAL APPENDAGE OCCLUSION N/A 02/16/2016   Watchman not placed - nickel allergy  . LEFT HEART CATHETERIZATION WITH CORONARY/GRAFT ANGIOGRAM N/A 06/07/2014   Procedure: LEFT HEART  CATHETERIZATION WITH Beatrix Fetters;  Surgeon: Burnell Blanks, MD;  Location: [facility] CATH LAB;  Service: Cardiovascular;  Laterality: N/A;  . LIVER BIOPSY    . LOOP RECORDER IMPLANT N/A 12/10/2011   Procedure: LOOP RECORDER IMPLANT;  Surgeon: Evans Lance, MD;  Location: [facility] CATH LAB;  Service: Cardiovascular;  Laterality: N/A;  . MALONEY DILATION  06/23/2012   Procedure: [name] DILATION;  Surgeon: Gatha Mayer, MD;  Location: [facility] ENDOSCOPY;  Service: Endoscopy;;  54 fr  . MASTECTOMY Left 05/17/2015   total   . NEPHRECTOMY  2008   Laparoscopic right nephrectomy, renal cell carcinoma  . PACEMAKER INSERTION  08/21/2012   Medtronic Adapta L dual-chamber pacemaker  . PERCUTANEOUS CORONARY STENT INTERVENTION (PCI-S) N/A 04/09/2013   Procedure: PERCUTANEOUS CORONARY STENT INTERVENTION (PCI-S);  Surgeon: Burnell Blanks, MD;  Location: [facility] CATH LAB;  Service: Cardiovascular;  Laterality: N/A;  . PERMANENT PACEMAKER INSERTION N/A 08/21/2012   Procedure: PERMANENT PACEMAKER INSERTION;  Surgeon: Evans Lance, MD;  Location: [facility] CATH LAB;  Service: Cardiovascular;  Laterality: N/A;  . TEE WITHOUT CARDIOVERSION N/A 12/07/2015   Procedure: TRANSESOPHAGEAL ECHOCARDIOGRAM (TEE);  Surgeon: Larey Dresser, MD;  Location: [place];  Service: Cardiovascular;  Laterality: N/A;  . TOTAL ABDOMINAL HYSTERECTOMY W/ BILATERAL SALPINGOOPHORECTOMY  2000  . TOTAL MASTECTOMY Left 05/17/2015   Procedure: LEFT TOTAL MASTECTOMY;  Surgeon: Rolm Bookbinder, MD;  Location: [place];  Service: General;  Laterality: Left;  . TUMOR EXCISION  1981   removal of Hodgkins lymphoma    OB History    No data available       Home Medications    Prior to Admission medications   Medication Sig Start Date End Date Taking? Authorizing Provider  Alirocumab (PRALUENT [place]) Inject 150 mLs into the skin every 14 (fourteen) days.   Yes [provider]  ALPRAZolam Duanne Moron) 0.5 MG tablet Take 0.5 mg by mouth  at bedtime. May take additional dose daily for anxiety   Yes [provider]  amLODipine (NORVASC) 5 MG tablet Take 1 tablet (5 mg total) by mouth daily. 05/14/16  Yes Larey Dresser, MD  APIDRA 100 UNIT/ML injection INFUSE THROUGH INSULIN PUMP UTD. Has been receiving 82-125 units daily over last 10 days.  Asked pt to bring with her on admission 10/25 02/04/15  Yes [provider]  aspirin EC 81 MG tablet Take 81 mg by mouth at bedtime.   Yes [provider]  BRILINTA 60 MG TABS tablet TAKE 1 TABLET(60 MG) BY MOUTH TWICE DAILY 06/28/16  Yes Larey Dresser, MD  Calcium Carbonate-Vitamin D (RA CALCIUM PLUS VITAMIN D)  600-400 MG-UNIT per tablet Take 1 tablet by mouth daily.    Yes [provider]  Cholecalciferol 2000 UNITS TABS Take 1 tablet by mouth daily.   Yes [provider]  colchicine 0.6 MG tablet Take 0.6 mg by mouth daily. And,Take 1 tablet at onset and repeate in 1 hour and then 1 daily x 2 (FOR FLARES)   Yes [provider]  Fe Fum-FA-B Cmp-C-Zn-Mg-Mn-Cu (HEMATINIC PLUS COMPLEX) 106-1 MG TABS Take 1 tablet oraly three times a week or as directed daily with with a Vitamin C tablet 07/26/16  Yes Livesay, Lennis P, MD  isosorbide mononitrate (IMDUR) 60 MG 24 hr tablet TAKE 1 TABLET BY MOUTH ONCE DAILY 07/24/16  Yes Larey Dresser, MD  latanoprost (XALATAN) 0.005 % ophthalmic solution Place 1 drop into both eyes at bedtime.   Yes [provider]  Magnesium Oxide 250 MG TABS Take 250 mg by mouth daily.   Yes [provider]  metoprolol (LOPRESSOR) 50 MG tablet TAKE 1 AND 1/2 TABLETS(75 MG) BY MOUTH TWICE DAILY 02/23/16  Yes Evans Lance, MD  Multiple Vitamins-Minerals (MULTIVITAMIN WITH MINERALS) tablet Take 1 tablet by mouth daily.     Yes [provider]  Omega-3 Fatty Acids (FISH OIL) 1000 MG CAPS Take 1,000 mg by mouth daily.    Yes [provider]  omeprazole (PRILOSEC) 20 MG capsule Take 20 mg by mouth  every other day.    Yes [provider]  potassium chloride (K-DUR,KLOR-CON) 10 MEQ tablet Take 6 tablets (60 mEq total) by mouth 2 (two) times daily. 10/22/16  Yes Larey Dresser, MD  pravastatin (PRAVACHOL) 10 MG tablet TAKE 1 TABLET BY MOUTH ONCE DAILY 03/29/16  Yes Larey Dresser, MD  torsemide (DEMADEX) 20 MG tablet Take 2 tablets (40 mg total) by mouth 2 (two) times daily. 08/24/16  Yes Larey Dresser, MD  triamcinolone cream (KENALOG) 0.5 % Apply 1 application topically 3 (three) times daily.   Yes [provider]  ULORIC 40 MG tablet Take 40 mg by mouth daily. 03/15/16  Yes [provider]  azithromycin (ZITHROMAX) 500 MG tablet 1 tablet by mouth one hour prior to dental appointment/procedure Patient not taking: Reported on 11/24/2016 12/21/13   Larey Dresser, MD  clobetasol (TEMOVATE) 0.05 % external solution Apply 1 application topically daily as needed (for hives). Reported on 12/05/2015    [provider]  glucagon (GLUCAGON EMERGENCY) 1 MG injection Inject 1 mg into the vein once as needed (for severe reaction). Reported on 12/05/2015    [provider]  nitroGLYCERIN (NITROLINGUAL) 0.4 MG/SPRAY spray Place 1 spray under the tongue every 5 (five) minutes as needed for chest pain. 05/14/16   Larey Dresser, MD    Family History Family History  Problem Relation Age of Onset  . Heart attack Mother   . Heart disease Mother   . Heart attack Father   . Diabetes Father   . Heart disease Father   . Lung cancer Sister     meta  . Breast cancer Sister     half sister (Paternal)  . Ovarian cancer Sister   . Cervical cancer Sister     half sister (maternal)  . Hypertension Brother   . Glaucoma Brother   . Colon cancer Neg Hx   . Stomach cancer Neg Hx     Social History Social History  Substance Use Topics  . Smoking status: Never Smoker  . Smokeless tobacco: Never  Used  . Alcohol use No     Allergies   Avandia [rosiglitazone  maleate]; Cephalexin; Clindamycin hcl; Lincomycin; Metformin; Novolog [insulin aspart]; Penicillins; Tizanidine; Adhesive [tape]; Atorvastatin; Cholestyramine; Crestor [rosuvastatin]; Gentamycin [gentamicin]; Humalog [insulin lispro]; Iodinated diagnostic agents; Limonene; Pravastatin; Prednisone; Simvastatin; Strawberry extract; Plavix [clopidogrel bisulfate]; Codeine; Erythromycin; Erythromycin base; Hydrocodone-acetaminophen; Latex; Neomycin; Nickel; Ranitidine; Tamiflu [oseltamivir]; and Tramadol   Review of Systems Review of Systems  Constitutional: Negative for chills and fever.  HENT: Negative for facial swelling and sore throat.   Respiratory: Negative for shortness of breath.   Cardiovascular: Positive for leg swelling. Negative for chest pain.  Gastrointestinal: Negative for abdominal pain, nausea and vomiting.  Genitourinary: Negative for dysuria.  Musculoskeletal: Negative for back pain.  Skin: Positive for rash. Negative for wound.  Neurological: Negative for headaches.  Psychiatric/Behavioral: The patient is not nervous/anxious.      Physical Exam Updated Vital Signs BP (!) 124/59   Pulse 81   Temp 97.5 F (36.4 C) (Oral)   Resp 20   SpO2 94%   Physical Exam  Constitutional: She appears well-developed and well-nourished. No distress.  HENT:  Head: Normocephalic and atraumatic.  Mouth/Throat: Oropharynx is clear and moist. No oropharyngeal exudate.  Eyes: Conjunctivae are normal. Pupils are equal, round, and reactive to light. Right eye exhibits no discharge. Left eye exhibits no discharge. No scleral icterus.  Neck: Normal range of motion. Neck supple. No thyromegaly present.  Cardiovascular: Normal rate, regular rhythm, normal heart sounds and intact distal pulses.  Exam reveals no gallop and no friction rub.   No murmur heard. Pulmonary/Chest: Effort normal and breath sounds normal. No stridor. No respiratory distress. She has no wheezes. She has no rales.    Abdominal: Soft. Bowel sounds are normal. She exhibits no distension. There is no tenderness. There is no rebound and no guarding.  Musculoskeletal: She exhibits no edema.  Right lower extremity: Tenderness to lateral ankle over discoloration (resembling ecchymosis)- see photo; mild edema to anterior ankle; full range of motion with plantar and dorsiflexion; DP pulses intact; normal sensation; cap refill <2secs  Lymphadenopathy:    She has no cervical adenopathy.  Neurological: She is alert. Coordination normal.  Skin: Skin is warm and dry. No rash noted. She is not diaphoretic. No pallor.  Psychiatric: She has a normal mood and affect.  Nursing note and vitals reviewed.        ED Treatments / Results  Labs (all labs ordered are listed, but only abnormal results are displayed) Labs Reviewed - No data to display  EKG  EKG Interpretation None       Radiology No results found.  Procedures Procedures (including critical care time)  Medications Ordered in ED Medications - No data to display   Initial Impression / Assessment and Plan / ED Course  I have reviewed the triage vital signs and the nursing notes.  Pertinent labs & imaging results that were available during my care of the patient were reviewed by me and considered in my medical decision making (see chart for details).     Patient without DVT, but suspected superficial thrombus on the vascular ultrasound. Advised warm compresses. Patient is unable to take excessive amounts of NSAIDs. Advised NSAID treatment as tolerated. Bedside ultrasound showed plaque density without stranding encapsulation, blood flow, compressibility. Follow-up to PCP in one week for recheck and further evaluation if symptoms are continuing. Recommend biopsy if not resolving. Return precautions discussed. Patient and husband understand and agree with plan. Patient vitals  stable throughout ED course and discharged in satisfactory condition.  Patient also evaluated by Dr. Sabra Heck who guided the patient's management and agrees with plan.  Final Clinical Impressions(s) / ED Diagnoses   Final diagnoses:  Right leg pain  Thrombophlebitis of superficial veins of right lower extremity    New Prescriptions Discharge Medication List as of 11/24/2016  1:05 PM       Frederica Kuster, PA-C 11/24/16 1619    Noemi Chapel, MD 11/25/16 1041

## 2016-11-24 NOTE — ED Triage Notes (Signed)
Pt hx of tumor/cancer/Diabetes. Pt on blood thinners for heart stents. Pt denies injury.

## 2016-11-24 NOTE — ED Provider Notes (Signed)
Medical screening examination/treatment/procedure(s) were conducted as a shared visit with non-physician practitioner(s) and myself.  I personally evaluated the patient during the encounter.  Clinical Impression:   Final diagnoses:  Right leg pain  Thrombophlebitis of superficial veins of right lower extremity      The patient is a 66 year old female, prior history of cancer, presents with right ankle swelling mostly over the lateral distal lower extremity. This is proximal to the lateral malleolus. There is a small amount of redness, initially itchy, treated with a topical steroid but without significant improvement. She is now having some tenderness and yesterday noticed swelling of the ankle. On my exam the patient is in no distress, legs are symmetrical, there is no edema at the ankles, over the right lateral lower extremity there is a small out of tenderness, the skin is slightly hyperpigmented and on a bedside ultrasound does reveal that there is a vague area of black density without surrounding encapsulation, without blood flow, it is not compressible, it is mildly tender. The patient will need an ultrasound to rule out DVT in general given the swelling of the ankle yesterday however I suspect that this is not vascular but more of a lymph node issue and she may need a further biopsy in the outpatient setting. She agreed to this plan.   Noemi Chapel, MD 11/25/16 1041

## 2016-11-24 NOTE — Discharge Instructions (Signed)
Apply warm compresses alternating 20 minutes on, 20 minutes off. If possible, take ibuprofen every 6 hours. Please follow-up with your primary care provider within one week for recheck. If your symptoms are not improving, you may need a biopsy of the area, as it could be a lymph node. Please return to the emergency department or see her primary care doctor immediately if he develop any new or worsening symptoms.  Right lower extremity venous duplex completed. Preliminary report:  There is no DVT noted in the right lower extremity.  There is superficial thrombus noted at the site of the knot in the distal lateral calf.

## 2016-11-24 NOTE — ED Triage Notes (Signed)
Pt states her R leg just above her ankle, c/o pain, swelling, burning, stinging, x2 week. Pulses strong and palpable. Pt given steroid cream and iced it, without relief. Pt has mild bruising to R ankle.

## 2016-11-24 NOTE — Progress Notes (Signed)
VASCULAR LAB PRELIMINARY  PRELIMINARY  PRELIMINARY  PRELIMINARY  Right lower extremity venous duplex completed.    Preliminary report:  There is no DVT noted in the right lower extremity.  There is superficial thrombus noted at the site of the knot in the distal lateral calf.   Called report to Dr. Claris Gladden, [facility], RVT 11/24/2016, 12:05 PM

## 2016-11-26 ENCOUNTER — Telehealth: Payer: Self-pay | Admitting: Neurology

## 2016-11-26 NOTE — Telephone Encounter (Signed)
Left message on machine for patient to call back.  To let her know we received a disc of films from 2009 and figure out how she wants [name] to proceed with this information.

## 2016-11-28 ENCOUNTER — Ambulatory Visit (INDEPENDENT_AMBULATORY_CARE_PROVIDER_SITE_OTHER): Payer: Medicare Other | Admitting: Neurology

## 2016-11-28 DIAGNOSIS — R471 Dysarthria and anarthria: Secondary | ICD-10-CM

## 2016-11-28 DIAGNOSIS — R4702 Dysphasia: Secondary | ICD-10-CM

## 2016-11-28 DIAGNOSIS — R413 Other amnesia: Secondary | ICD-10-CM

## 2016-11-29 ENCOUNTER — Telehealth (HOSPITAL_COMMUNITY): Payer: Self-pay | Admitting: Cardiology

## 2016-11-29 NOTE — Telephone Encounter (Signed)
I spoke with patient.  She is aware of the appt on 11/30/16 @2 :40 with Dr. Aundra Dubin.

## 2016-11-29 NOTE — Telephone Encounter (Signed)
Per staff message from Kevan Rosebush, RN called pt to schedule appt with Dr. Aundra Dubin for 11/30/16 @2 :86.  No answer machine on cell, left message on home answering machine with appt info and asked pt to call back to confirm she received this info.

## 2016-11-30 ENCOUNTER — Encounter (HOSPITAL_COMMUNITY): Payer: Self-pay

## 2016-11-30 ENCOUNTER — Ambulatory Visit (HOSPITAL_COMMUNITY)
Admission: RE | Admit: 2016-11-30 | Discharge: 2016-11-30 | Disposition: A | Discharge status: Home / Self Care | Payer: Medicare Other | Source: Ambulatory Visit | Attending: Cardiology | Admitting: Cardiology

## 2016-11-30 VITALS — BP 141/59 | HR 80 | Ht 62.5 in | Wt 121.5 lb

## 2016-11-30 DIAGNOSIS — I495 Sick sinus syndrome: Secondary | ICD-10-CM | POA: Diagnosis not present

## 2016-11-30 DIAGNOSIS — Z905 Acquired absence of kidney: Secondary | ICD-10-CM | POA: Diagnosis not present

## 2016-11-30 DIAGNOSIS — Z794 Long term (current) use of insulin: Secondary | ICD-10-CM | POA: Insufficient documentation

## 2016-11-30 DIAGNOSIS — I5032 Chronic diastolic (congestive) heart failure: Secondary | ICD-10-CM | POA: Diagnosis not present

## 2016-11-30 DIAGNOSIS — I2510 Atherosclerotic heart disease of native coronary artery without angina pectoris: Secondary | ICD-10-CM

## 2016-11-30 DIAGNOSIS — Z8711 Personal history of peptic ulcer disease: Secondary | ICD-10-CM | POA: Insufficient documentation

## 2016-11-30 DIAGNOSIS — Z951 Presence of aortocoronary bypass graft: Secondary | ICD-10-CM | POA: Insufficient documentation

## 2016-11-30 DIAGNOSIS — E1143 Type 2 diabetes mellitus with diabetic autonomic (poly)neuropathy: Secondary | ICD-10-CM | POA: Diagnosis not present

## 2016-11-30 DIAGNOSIS — E785 Hyperlipidemia, unspecified: Secondary | ICD-10-CM | POA: Insufficient documentation

## 2016-11-30 DIAGNOSIS — I480 Paroxysmal atrial fibrillation: Secondary | ICD-10-CM

## 2016-11-30 DIAGNOSIS — R4781 Slurred speech: Secondary | ICD-10-CM | POA: Diagnosis not present

## 2016-11-30 DIAGNOSIS — Z85528 Personal history of other malignant neoplasm of kidney: Secondary | ICD-10-CM | POA: Insufficient documentation

## 2016-11-30 LAB — BASIC METABOLIC PANEL
Anion gap: 12 (ref 5–15)
BUN: 37 mg/dL — ABNORMAL HIGH (ref 6–20)
CALCIUM: 9.9 mg/dL (ref 8.9–10.3)
CO2: 25 mmol/L (ref 22–32)
CREATININE: 1.27 mg/dL — AB (ref 0.44–1.00)
Chloride: 103 mmol/L (ref 101–111)
GFR calc Af Amer: 51 mL/min — ABNORMAL LOW (ref >60)
GFR calc non Af Amer: 44 mL/min — ABNORMAL LOW (ref >60)
Glucose, Bld: 197 mg/dL — ABNORMAL HIGH (ref 65–99)
Potassium: 3.4 mmol/L — ABNORMAL LOW (ref 3.5–5.1)
Sodium: 140 mmol/L (ref 135–145)

## 2016-11-30 LAB — BRAIN NATRIURETIC PEPTIDE: B NATRIURETIC PEPTIDE 5: 73.2 pg/mL (ref 0.0–100.0)

## 2016-11-30 NOTE — Telephone Encounter (Signed)
Spoke with patient and she states it was asked in her visit when her last CT scan was - so she requested a copy be sent to [name]. She is aware it was normal and has a copy of it herself. She will hold on to it if needed in the future.

## 2016-11-30 NOTE — Patient Instructions (Signed)
Labs today  Your physician recommends that you schedule a follow-up appointment in: 3 months  

## 2016-12-02 NOTE — Progress Notes (Signed)
Patient ID: [name] [name], female   DOB: [birth year], 66 y.o.   MRN: [id number] PCP: Dr. Hilma Favors Oncologist: Dr Marko Plume Cardiology: Dr. Aundra Dubin  66 yo with history of CAD s/p CABG in 12/05 and redo in 1/11, bioprosthetic AVR, diastolic CHF, and sick sinus syndrome s/p PCM presents for cardiology followup.  She had initial CABG with SVG-RCA in 12/05 followed by redo SVG-RCA and bioprosthetic AVR in 1/11.  She has had diastolic CHF and is on Lasix.  In 9/14, she had a CPX test.  This showed severe functional limitation with ischemic ECG changes (inferolateral ST depression and ST elevation in V1 and V2). She had chest pain.  She had R/LHC in 9/14.  This showed elevated left and right heart filling pressures, patent SVG-PDA, and 80% distal LM/80% ostial LAD stenosis.  She was not a candidate for redo CABG (had 2 prior sternotomies as well as chest radiation for Hodgkins lymphoma).  She had DES from left main into proximal LAD and will need long-term DAPT => Brilinta was used as she has been a poor responder to Plavix in the past.  She re-developed angina in 11/15 and was admitted with unstable angina.  LHC showed 80% ostial LM and patent SVG-RCA. She had DES to ostial LM. Last echo in 1/18 showed EF 60-65%, normal bioprosthetic aortic valve, mild-moderate MR, PASP 35 mmHg.   She has chronic iron deficiency anemia thought to be due to a slow GI bleed.  Last EGD was unremarkable and a capsule endoscopy was also unremarkable.  She has had periodic transfusions; However, recently hemoglobin has been stable.    She had bilateral mastectomy for DCIS in 10/62 without complication.  She has an insulin pump followed by an endocrinologist at [facility].   Given myalgias with statins, she was started on Praluent.  LDL is now excellent.    In the past she has had short runs of atrial fibrillation.  She is not anticoagulated.  Planned for Watchman placement, but given her nickel allergy, we were unable to place a Watchman.   Today, she is in NSR.   Cardiolite in 2/18 showed no ischemia or infarction.     Breathing is stable.  She is short of breath with stairs or inclines.  No dyspnea on flat ground.  No chest pain.  Diagnosed with OSA, needs CPAP titration.  In 3/18, she had an episode of transient dysarthria.  ?TIA, saw neuro.  Head CT negative.  No further neurological symptoms.   Labs (7/14): hemoglobin 11 Labs (9/14): K 3.9, creatinine 1.48, hemoglobin 7.3 Labs (10/14): hemoglobin 7.7 Labs (11/14): K 3.8, creatinine 1.5 Labs (12/14): HCT 37.7 Labs (1/15): LDL 106, HDL 41, K 3.9, creatinine 1.6, LFTs normal Labs (2/15): K 3.7, creatinine 1.1, BNP 71 Labs (3/15): HCT 36.7 Labs (4/15): K 3.7, creatinine 1.1, HCT 35.5 Labs (7/15): HCT 36.4 Labs (11/15): HCT 32.1, K 4.2, creatinine 1.06 Labs (12/15): hgb 12 Labs (1/16): HCT 37.1 Labs (3/16): LDL 25, HDL 42, elevated transaminases Labs (6/16): HCT 39.6, plts 146 Labs (7/16): LDL 59, HDL 41, AST 34, ALT 49, AP 125 Labs (11/16): HCT 38.8, alkaline phosphatase 156, AST/ALT normal, K 4, creatinine 1.0 Labs (3/17): K 4.4, creatinine 1.4, LFTs normal, hgb 12.5 Labs (5/17): HCT 41.8, K 4, creatinine 1.3 Labs (9/17): K 4, creatinine 0.43, hgb 13.6 Labs (10/17): LDL 42, HDL 43 Labs (1/18): K 4.3, creatinine 1.1 => 1.19, AST 66, ALT 67, hgb 14.2, BNP 72 Labs (2/18): K 3.9, creatinine 1.2, BNP  49.6, HCT 42.1 Labs (4/18): hgb 13.8  PMH: 1. Sick sinus syndrome s/p [employer] PCM.  2. HTN 3. H/o SBO 4. Renal cell carcinoma s/p right nephrectomy in 2008.  5. 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  Fibroscan in 11/14 showed advanced fibrosis concerning for cirrhosis. EGD in 11/14 showed no varices.  6. Diastolic CHF: Echo (6/76) with EF 60-65%, bioprosthetic aortic valve with mean gradient 15 mmHg, mild MR.  CPX (9/14): peak VO2 10.4, VE/VCO2 43.8, inferolateral ST depression and ST elevation in V1/V2 with severe dyspnea and chest pain, severe functional limitation  with ischemic changes.  Echo (9/14) with EF 60-65%, normal RV size and systolic function, mild-moderate MR, bioprosthetic aortic valve normal, PA systolic pressure 51 mmHg, no evidence for pericardial constriction though exam incomplete for constriction.  Echo (11/15) with EF 55-60%, bioprosthetic aortic valve, mild-moderate MR, moderate TR, PA systolic pressure 42 mmHg.  - TEE (5/17) with EF 60-65%, normal bioprosthetic aortic valve with mean gradient 13 mmHg, normal RV size and systolic function. - Echo (1/18) with EF 60-65%, normal bioprosthetic aortic valve, mild-moderate MR, PASP 35 mmHg.  7. TAH-BSO 8. Type II diabetes: Has insulin pump.  9. H/o ischemic colitis. 10. H/o PUD. 11. Diabetic gastroparesis. 12. Hyperlipidemia: Myalgias with Crestor and atorvastatin, elevated LFTs with simvastatin.  Myalgias with > 10 mg pravastatin. Now on Praluent.  13. CKD 14. Bicuspid aortic valve s/p 19 mm Edwards pericardial valve in 1/11 (had post-operative Dresslers syndrome).   15. CAD: CABG with SVG-RCA in 12/05.  Redo SVG-RCA in 1/11 with AVR.  LHC/RHC (9/14) with mean RA 18, PA 62/25 mean 43, mean PCWP 26, CI 2.8, 70-80% distal LM stenosis, 80% ostial LAD stenosis, total occlusion RCA, SVG-PDA patent.  Patient had DES LM into LAD.  Plan to continue long-term ASA/Brilinta (poor responder to Plavix).  ETT-cardiolite (4/15) with 6' exercise, EF 60%, ST depression in recovery and mild chest pain, no ischemia or infarction by perfusion images.  Unstable angina 11/15 with 80% LM, patent SVG-RCA => DES to ostial LM.   - Lexiscan Cardiolite (2/18): EF 62%, no ischemia or infarction (normal).  She had a significant reaction to [place] and probably should not get again.  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  17. Anemia: Iron-deficiency.  Suspect chronic GI blood loss.  EGD in 9/14 was unremarkable.  Capsule endoscopy 10/14 was unremarkable.  Has required periodic blood  transfusions.  18. Atrial fibrillation: Paroxysmal, noted by [facility] interrogation.  19. Carotid stenosis: Carotid dopplers (2/15) with 40-59% bilateral stenosis. Carotid dopplers (3/16) with 40-59% bilateral ICA stenosis. Carotid dopplers (3/17) with 40-59% bilateral ICA stenosis.  - Carotid dopplers (3/18) with mild BICA stenosis.  20. 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  22. C difficile colitis 2017 23. Gout 24. ?TIA: head CT negative.  25. OSA: Moderate, needs CPAP titration.   SH: Married, lives in [place], nonsmoker.   FH: CAD  ROS: All systems reviewed and negative except as per HPI.   Current Outpatient Prescriptions  Medication Sig Dispense Refill  . Alirocumab (PRALUENT [place]) Inject 150 mLs into the skin every 14 (fourteen) days.    . ALPRAZolam (XANAX) 0.5 MG tablet Take 0.5 mg by mouth at bedtime. May take additional dose daily for anxiety    . amLODipine (NORVASC) 5 MG tablet Take 1 tablet (5 mg total) by mouth daily. 90 tablet  3  . APIDRA 100 UNIT/ML injection INFUSE THROUGH INSULIN PUMP UTD. Has been receiving 82-125 units daily over last 10 days.  Asked pt to bring with her on admission 10/25  2  . aspirin EC 81 MG tablet Take 81 mg by mouth at bedtime.    Marland Kitchen azithromycin (ZITHROMAX) 500 MG tablet 1 tablet by mouth one hour prior to dental appointment/procedure 2 tablet 0  . BRILINTA 60 MG TABS tablet TAKE 1 TABLET(60 MG) BY MOUTH TWICE DAILY 180 tablet 2  . Calcium Carbonate-Vitamin D (RA CALCIUM PLUS VITAMIN D) 600-400 MG-UNIT per tablet Take 1 tablet by mouth daily.     . Cholecalciferol 2000 UNITS TABS Take 1 tablet by mouth daily.    . clobetasol (TEMOVATE) 0.05 % external solution Apply 1 application topically daily as needed (for hives). Reported on 12/05/2015    . colchicine 0.6 MG tablet Take 0.6 mg by mouth daily. And,Take 1 tablet at onset  and repeate in 1 hour and then 1 daily x 2 (FOR FLARES)    . Fe Fum-FA-B Cmp-C-Zn-Mg-Mn-Cu (HEMATINIC PLUS COMPLEX) 106-1 MG TABS Take 1 tablet oraly three times a week or as directed daily with with a Vitamin C tablet 60 each 0  . glucagon (GLUCAGON EMERGENCY) 1 MG injection Inject 1 mg into the vein once as needed (for severe reaction). Reported on 12/05/2015    . isosorbide mononitrate (IMDUR) 60 MG 24 hr tablet TAKE 1 TABLET BY MOUTH ONCE DAILY 90 tablet 3  . latanoprost (XALATAN) 0.005 % ophthalmic solution Place 1 drop into both eyes at bedtime.    . Magnesium Oxide 250 MG TABS Take 250 mg by mouth daily.    . metoprolol (LOPRESSOR) 50 MG tablet TAKE 1 AND 1/2 TABLETS(75 MG) BY MOUTH TWICE DAILY 270 tablet 3  . Multiple Vitamins-Minerals (MULTIVITAMIN WITH MINERALS) tablet Take 1 tablet by mouth daily.      . nitroGLYCERIN (NITROLINGUAL) 0.4 MG/SPRAY spray Place 1 spray under the tongue every 5 (five) minutes as needed for chest pain. 12 g 1  . Omega-3 Fatty Acids (FISH OIL) 1000 MG CAPS Take 1,000 mg by mouth daily.     Marland Kitchen omeprazole (PRILOSEC) 20 MG capsule Take 20 mg by mouth every other day.     . potassium chloride (K-DUR,KLOR-CON) 10 MEQ tablet Take 6 tablets (60 mEq total) by mouth 2 (two) times daily. 240 tablet 3  . pravastatin (PRAVACHOL) 10 MG tablet TAKE 1 TABLET BY MOUTH ONCE DAILY 90 tablet 3  . torsemide (DEMADEX) 20 MG tablet Take 2 tablets (40 mg total) by mouth 2 (two) times daily. 120 tablet 3  . triamcinolone cream (KENALOG) 0.5 % Apply 1 application topically 3 (three) times daily.    Marland Kitchen ULORIC 40 MG tablet Take 40 mg by mouth daily.  11   No current facility-administered medications for this encounter.     BP (!) 141/59   Pulse 80   Ht 5' 2.5" (1.588 m)   Wt 121 lb 8 oz (55.1 kg)   BMI 21.87 kg/m  General: NAD.  Neck: JVP not elevated, no thyromegaly or thyroid nodule.  Lungs: Clear to auscultation bilaterally.  CV: Nondisplaced PMI.  Heart regular S1/S2, no  S3/S4, 2/6 early systolic murmur RUSB.  No edema.  Left>right carotid bruit.  2+ PT pulses bilaterally.  Abdomen: Soft, nontender, no hepatosplenomegaly, no distention.  Skin: Intact without lesions or rashes.  Neurologic: Alert and oriented x 3.  Psych: Normal affect. Extremities: No clubbing  or cyanosis.   Assessment/Plan: 1. CAD: s/p SVG-RCA in 12/05, then redo SVG-RCA in 1/11--> distal left main/proximal LAD severe stenosis in 9/14.  PCI with DES to distal LM/proximal LAD was done rather than bypass as she would have been a poor candidate for redo sternotomy (2 prior sternotomies and history of chest wall radiation for Hodgkins disease).  Recurrent unstable angina with 80% ostial LM treated with DES in 11/15.  No chest pain. Recent Lexiscan Cardiolite in 2/18 showed normal EF, no ischemia/infarction. - Continue Brilinta 60 mg bid (poor responder to plavix) long-term given recurrent left main disease.  This has been complicated by suspected slow GI bleed. Most recent hemoglobin was normal. - Continue ASA 81, statin, metoprolol, Imdur at the current doses.    2. Bioprosthetic aortic valve replacement: In setting of bicuspid aortic valve, thoracic aorta not dilated on 2017 TEE.  Stable on 1/18 echo.   3. Chronic diastolic CHF: Recent echo unchanged compared to the past.  At last appointment, we increased her torsemide.  Weight has trended down, she does not look volume overloaded on exam.  NYHA class II symptoms now, improved.  - Continue torsemide at 40 mg bid and KCl 60 bid.  BMET today.   4. CKD:  Patient has a single kidney s/p right nephrectomy.  She sees nephrology. BMET today.  5. Hyperlipidemia: She has not been able to tolerate any higher potency statin than pravastatin 10 mg daily.   - Continue Praluent, good lipids when last checked.       6. Anemia: Suspected slow GI bleed.  Unremarkable EGD and capsule endoscopy in fall 2014.  Unfortunately, needs to stay on Brilinta long-term (Plavix  non-responder).  Will need to follow closely (sees hematology), plan for transfusion with hemoglobin < 8 as this allows best control of her symptoms. Recently, hemoglobin has been more stable.   7. Atrial fibrillation: Paroxysmal, brief episodes noted on PCM interrogation.  Given GI bleeding and need to be on Brilinta, risks have been thought to outweigh benefits of anticoagulation.  She is in NSR today.   - Ongoing brief atrial fibrillation episodes. She is not a good candidate for anticoagulants due to to GI bleed.  She is not a good Multaq candidate with CHF.  If she needs an antiarrhythmic, consider Tikosyn or Sotalol.  - We were unable to place Watchman device due to nickel allergy => She wonders if her PPM is titanium-nickel, as if so, she tolerated this without problems.  We are trying to find this out.   8. Carotid bruit: Only mild stenosis on last dopplers in 3/18. 9. SSS: Pacemaker in place.  10. HTN: controlled. Continue current regimen.   11. OSA: Moderate, awaiting CPAP titration.  12. ?TIA: Transient dysarthria in 3/18, could have been afib-related TIA.  No recurrent symptoms.    Followup in 3 months   [name] 12/02/2016

## 2016-12-04 NOTE — Procedures (Signed)
ELECTROENCEPHALOGRAM REPORT  Dates of Recording: 11/28/2016 10:30AM to 11/29/2016 09:56AM  Patient's Name: [name] [name] MRN: [id number] Date of Birth: 10/29/1950  Referring Provider: Dr. Wells Guiles Tat  Procedure: 24-hour ambulatory EEG  History: This is a 66 year old woman with an episode of slurred speech  Medications:  PRALUENT [place]  XANAX NORVASC APIDRA aspirin EC 81 MG BRILINTA RA CALCIUM PLUS VITAMIN D Cholecalciferol  TEMOVATE colchicine  HEMATINIC PLUS COMPLE Vitamin C tablet GLUCAGON EMERGENCY IMDUR  Technical Summary: This is a 24-hour multichannel digital EEG recording measured by the international 10-20 system with electrodes applied with paste and impedances below 5000 ohms performed as portable with EKG monitoring.  The digital EEG was referentially recorded, reformatted, and digitally filtered in a variety of bipolar and referential montages for optimal display.    DESCRIPTION OF RECORDING: During maximal wakefulness, the background activity consisted of a symmetric 10 Hz posterior dominant rhythm which was reactive to eye opening. There was occasional focal 4-5 Hz theta slowing over the left temporal region.  There were no epileptiform discharges seen in wakefulness.  During the recording, the patient progresses through wakefulness, drowsiness, and Stage 2 sleep. Similar occasional focal slowing was seen over the left temporal region, at times sharply contoured without clear epileptogenic potential.  Again, there were no epileptiform discharges seen.  Events: Patient did not complete diary. There were no push button events.  There were no electrographic seizures seen.  EKG lead was unremarkable.  IMPRESSION: This 24-hour ambulatory EEG study is abnormal due to occasional focal slowing over the left temporal region.  Clinical Correlation of the above findings indicates focal cerebral dysfunction over the left temporal region suggestive of underlying  structural or physiologic abnormality. The absence of epileptiform discharges does not exclude a clinical diagnosis of epilepsy. If further clinical questions remain, inpatient video EEG monitoring may be considered. Clinical correlation is advised.   Ellouise Newer, M.D.

## 2016-12-05 ENCOUNTER — Telehealth: Payer: Self-pay | Admitting: Neurology

## 2016-12-05 ENCOUNTER — Telehealth (HOSPITAL_COMMUNITY): Payer: Self-pay | Admitting: Cardiology

## 2016-12-05 NOTE — Telephone Encounter (Signed)
Patient reports her weight has been creeping up over the past few days and she is very uncomfortable. Went out to eat for Mothers Day- did request for salmon and veggies to be prepared without salt. Clothes are very tight. abd swelling. Increased SOB, unable to lay flat at night, she is even uncomfortable laying on a wedge pillow, feels as if she is "gurgling".  Weight normally 115-116 115.7, 121, 122.8 today     Currently taking torsemide 40 BID and KCL 60 BID No metolazone   Last seen 5/11  Please advise

## 2016-12-05 NOTE — Telephone Encounter (Signed)
Spoke with patient and made her aware.   She wants to know what it is that could be causing this (what we would be looking for) and if this could be medication related, etc? Please advise.  Can also be reached at home number if cell phone doesn't pick up. [phone].

## 2016-12-05 NOTE — Telephone Encounter (Signed)
Can just be seen in patients of this age group for no known reason but we always rule out structural etiology which we havent seen in her (although she cannot have mri).  Meds wouldn't contribute to one sided slowing

## 2016-12-05 NOTE — Telephone Encounter (Signed)
-----   Message from Sherrill, DO sent at 12/04/2016  4:37 PM EDT ----- Let pt know that no epileptiform activity noted but L side of brain little slower than the right.  We didn't see anything on CT to account for this and she cannot have MRI brain.  We could contrast the CT to make 100% sure but not sure that will add much.  Her creatinine is already a little high but we could ask radiology if possible if she would like.

## 2016-12-05 NOTE — Telephone Encounter (Signed)
Pt aware and voiced understanding 

## 2016-12-05 NOTE — Telephone Encounter (Signed)
Take torsemide 60 mg BID tomorrow and then again on Friday if weight not down.   Take extra 20 meq of K total on these days.   Legrand Como [address]" [place], [place] 12/05/2016 3:46 PM

## 2016-12-05 NOTE — Telephone Encounter (Signed)
Patient left voicemail on triage voicemail that weight is up x 5 lbs overnight and she has increased SOB  Returned call to patient, no answer unable to leave message

## 2016-12-06 ENCOUNTER — Encounter: Payer: Self-pay | Admitting: Neurology

## 2016-12-06 NOTE — Telephone Encounter (Signed)
Left message on machine for patient to call back.

## 2016-12-07 NOTE — Telephone Encounter (Signed)
Mychart message sent to patient.

## 2016-12-13 ENCOUNTER — Other Ambulatory Visit (HOSPITAL_BASED_OUTPATIENT_CLINIC_OR_DEPARTMENT_OTHER): Payer: Medicare Other

## 2016-12-13 ENCOUNTER — Ambulatory Visit (HOSPITAL_BASED_OUTPATIENT_CLINIC_OR_DEPARTMENT_OTHER): Payer: Medicare Other | Admitting: Hematology and Oncology

## 2016-12-13 ENCOUNTER — Encounter: Payer: Self-pay | Admitting: Hematology and Oncology

## 2016-12-13 DIAGNOSIS — Z86000 Personal history of in-situ neoplasm of breast: Secondary | ICD-10-CM

## 2016-12-13 DIAGNOSIS — R7989 Other specified abnormal findings of blood chemistry: Secondary | ICD-10-CM

## 2016-12-13 DIAGNOSIS — C8190 Hodgkin lymphoma, unspecified, unspecified site: Secondary | ICD-10-CM

## 2016-12-13 DIAGNOSIS — N182 Chronic kidney disease, stage 2 (mild): Secondary | ICD-10-CM | POA: Diagnosis not present

## 2016-12-13 DIAGNOSIS — Z8571 Personal history of Hodgkin lymphoma: Secondary | ICD-10-CM | POA: Diagnosis not present

## 2016-12-13 DIAGNOSIS — D0512 Intraductal carcinoma in situ of left breast: Secondary | ICD-10-CM

## 2016-12-13 LAB — COMPREHENSIVE METABOLIC PANEL
ALT: 62 U/L — AB (ref 0–55)
ANION GAP: 9 meq/L (ref 3–11)
AST: 54 U/L — ABNORMAL HIGH (ref 5–34)
Albumin: 4.4 g/dL (ref 3.5–5.0)
Alkaline Phosphatase: 207 U/L — ABNORMAL HIGH (ref 40–150)
BILIRUBIN TOTAL: 0.74 mg/dL (ref 0.20–1.20)
BUN: 33.3 mg/dL — ABNORMAL HIGH (ref 7.0–26.0)
CALCIUM: 10.3 mg/dL (ref 8.4–10.4)
CO2: 28 mEq/L (ref 22–29)
CREATININE: 1.3 mg/dL — AB (ref 0.6–1.1)
Chloride: 105 mEq/L (ref 98–109)
EGFR: 42 mL/min/{1.73_m2} — ABNORMAL LOW (ref >90)
Glucose: 107 mg/dl (ref 70–140)
Potassium: 4.4 mEq/L (ref 3.5–5.1)
Sodium: 142 mEq/L (ref 136–145)
TOTAL PROTEIN: 7.9 g/dL (ref 6.4–8.3)

## 2016-12-13 LAB — CBC WITH DIFFERENTIAL/PLATELET
BASO%: 1.2 % (ref 0.0–2.0)
Basophils Absolute: 0.1 10*3/uL (ref 0.0–0.1)
EOS%: 2.1 % (ref 0.0–7.0)
Eosinophils Absolute: 0.2 10*3/uL (ref 0.0–0.5)
HEMATOCRIT: 40.3 % (ref 34.8–46.6)
HGB: 13.4 g/dL (ref 11.6–15.9)
LYMPH#: 1.2 10*3/uL (ref 0.9–3.3)
LYMPH%: 14.6 % (ref 14.0–49.7)
MCH: 29.5 pg (ref 25.1–34.0)
MCHC: 33.2 g/dL (ref 31.5–36.0)
MCV: 88.8 fL (ref 79.5–101.0)
MONO#: 0.8 10*3/uL (ref 0.1–0.9)
MONO%: 9.6 % (ref 0.0–14.0)
NEUT%: 72.5 % (ref 38.4–76.8)
NEUTROS ABS: 5.8 10*3/uL (ref 1.5–6.5)
PLATELETS: 107 10*3/uL — AB (ref 145–400)
RBC: 4.54 10*6/uL (ref 3.70–5.45)
RDW: 14.6 % — ABNORMAL HIGH (ref 11.2–14.5)
WBC: 8 10*3/uL (ref 3.9–10.3)

## 2016-12-13 LAB — IRON AND TIBC
%SAT: 22 % (ref 21–57)
IRON: 68 ug/dL (ref 41–142)
TIBC: 309 ug/dL (ref 236–444)
UIBC: 241 ug/dL (ref 120–384)

## 2016-12-13 LAB — FERRITIN: Ferritin: 477 ng/ml — ABNORMAL HIGH (ref 9–269)

## 2016-12-13 NOTE — Assessment & Plan Note (Signed)
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 Hansel Feinstein followed by radiation-induced hypothyroidism and coronary artery disease  06/11/2007 Right nephrectomy for renal cell carcinoma in 2008 T1b NX chromophobe variant [place] nuclear grade 3-4  Recent problems with bleeding related to Brilinta. Elevated ferritin could be related to underlying inflammation. Awaiting the complete iron panel to be drawn today.  I tried to reassure the patient that the elevated ferritin which is not significant enough to cause her any concerns.  Return to clinic on as-needed basis

## 2016-12-13 NOTE — Progress Notes (Signed)
[place] NOTE  Patient Care Team: Sharilyn Sites, MD as PCP - General (Family Medicine) Rothbart, Cristopher Estimable, MD (Cardiology) Gatha Mayer, MD (Gastroenterology) Altheimer, Legrand Como, MD (Endocrinology) Raynelle Bring, MD (Urology) [name], MD (Hematology and Oncology) [name], MD as Referring Physician (Endocrinology)  CHIEF COMPLAINTS/PURPOSE OF CONSULTATION:  Evaluation of elevated ferritin  HISTORY OF PRESENTING ILLNESS:  [name] [name] 66 y.o. female is here because of recent diagnosis of elevated ferritin. Patient has a previous history of Hodgkin's lymphoma and renal cell cancer both of which in remission. She was last seen by Dr. Marko Plume. 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  She was noted on recent blood work to have elevated ferritin and her primary care physician requested hematology follow-up. It was 498 based on blood work performed on 10/30/2016. Hemoglobin 13.8 and platelets 151. O-17 7116 and folic acid more than 20. Iron saturation 25%. She reports no sequelae or signs or symptoms of iron excess.  I reviewed her records extensively and collaborated the history with the patient.  MEDICAL HISTORY:  Past Medical History:  Diagnosis Date  . Anxiety   . Aortic stenosis    a. bicuspid aortic valve; mean gradient of 20 mmHg in 2/10; b. s/p bioprosth AVR (19-mm Edwards pericardial valve) with a redo coronary artery bypass graft procedure in 07/2009; postoperative Dressler's syndrome    . Arthritis   . Carcinoma, renal cell ([place]) 05/2007   Laparoscopic right nephrectomy  . Chronic diastolic CHF (congestive heart failure) ([place])    a. 9.2014 EchoP EF 60-65%, no rwma, bioprosth AVR, mean gradient of 87mHg, mild to mod MR, PASP 582mg.  . CKD (chronic kidney disease), stage II   . Colitis, ischemic ([facility]  . Coronary artery disease    a. initial CABG with SVG-RCA in 12/05. b. redo SVG-RCA  and bioprosthetic AVR in 1/11. d. 9/14 - PCI with DES to distal LM/proximal LAD was done rather than bypass as she would have been a poor candidate for redo sternotomy. d. S/p DES to prox LM 05/2014.  . Diabetes mellitus 03/2010    TYPE II: Hemoglobin A1c of 7.4 in  03/2010; 8.4 in 06/2010; treated with insulin pump  . Duodenal ulcer    Remote; H. pylori positive  . Gastroparesis due to DM ([facility]/04/2012  . Hodgkin's disease ([facility]   Mantle radiation therapy  . Hyperlipidemia   . Hyperplastic gastric polyp 06/23/2012  . Hypertension   . Iron deficiency anemia    a. 03/2013 EGD: essentially normal. Possible slow GIB.  . Migraines   . NASH (nonalcoholic steatohepatitis) 1999   -biopsy in 1999  . Osteopenia     hip on DEXA in October 2007.  . Osteoporosis   . PAF ([place]oxysmal atrial fibrillation) ([place]   a.  h/o brief episodes noted on ppm interrogation. b. given GI bleeding and need to be on both ASA 81 and Brilinta, risks likely outweigh benefits of anticoagulation.   . Small bowel obstruction (H[facility])    Recurrent; resolved after resection of a lipoma  . Syncope    a. H/o recurrent syncope with pauses on loop recorder s/p Medtronic pacemaker 07/2012.    SURGICAL HISTORY: Past Surgical History:  Procedure Laterality Date  . AORTIC VALVE REPLACEMENT  2011   Aortic valve replacement surgery with a 19 mm bioprosthetic device post redo CABG January 2011.  . Marland KitchenONE MARROW BIOPSY    .  BREAST EXCISIONAL BIOPSY     benign, bilateral  . CERVICAL BIOPSY    . COLONOSCOPY     Multiple with adenomatous polyps  . COLONOSCOPY  06/23/2012   Procedure: COLONOSCOPY;  Surgeon: Gatha Mayer, MD;  Location: [facility] END[facility]COPY;  Service: Endoscopy;  Laterality: N/A;  . CORONARY ARTERY BYPASS GRAFT  2005, 2011   CABG-most recent SVG to RCA-12/05; RCA occluded with patent graft in 2006; Redo bypass in 07/2009  . ESOPHAGOGASTRODUODENOSCOPY    . ESOPHAGOGASTRODUODENOSCOPY  06/23/2012   Procedure:  ESOPHAGOGASTRODUODENOSCOPY (EGD);  Surgeon: Gatha Mayer, MD;  Location: [facility];  Service: Endoscopy;  Laterality: N/A;  . ESOPHAGOGASTRODUODENOSCOPY N/A 04/06/2013   Procedure: ESOPHAGOGASTRODUODENOSCOPY (EGD);  Surgeon: Irene Shipper, MD;  Location: [facility] ENDOSCOPY;  Service: Endoscopy;  Laterality: N/A;  . EXPLORATORY LAPAROTOMY W/ BOWEL RESECTION     Small bowel resection of lipoma  . GIVENS CAPSULE STUDY N/A 04/30/2013   Procedure: GIVENS CAPSULE STUDY;  Surgeon: Gatha Mayer, MD;  Location: [place];  Service: Endoscopy;  Laterality: N/A;  . LEFT AND RIGHT HEART CATHETERIZATION WITH CORONARY ANGIOGRAM N/A 04/07/2013   Procedure: LEFT AND RIGHT HEART CATHETERIZATION WITH CORONARY ANGIOGRAM;  Surgeon: Larey Dresser, MD;  Location: [facility] CATH LAB;  Service: Cardiovascular;  Laterality: N/A;  . LEFT ATRIAL APPENDAGE OCCLUSION N/A 02/16/2016   Watchman not placed - nickel allergy  . LEFT HEART CATHETERIZATION WITH CORONARY/GRAFT ANGIOGRAM N/A 06/07/2014   Procedure: LEFT HEART CATHETERIZATION WITH Beatrix Fetters;  Surgeon: Burnell Blanks, MD;  Location: [facility] C[facility] LAB;  Service: Cardiovascular;  Laterality: N/A;  . LIVER BIOPSY    . LOOP RECORDER IMPLANT N/A 12/10/2011   Procedure: LOOP RECORDER IMPLANT;  Surgeon: Evans Lance, MD;  Location: [facility] CATH LAB;  Service: Cardiovascular;  Laterality: N/A;  . MALONEY DILATION  06/23/2012   Procedure: [name] DILATION;  Surgeon: Gatha Mayer, MD;  Location: [facility] ENDOSCOPY;  Service: Endoscopy;;  54 fr  . MASTECTOMY Left 05/17/2015   total   . NEPHRECTOMY  2008   Laparoscopic right nephrectomy, renal cell carcinoma  . PACEMAKER INSERTION  08/21/2012   Medtronic Adapta L dual-chamber pacemaker  . PERCUTANEOUS CORONARY STENT INTERVENTION (PCI-S) N/A 04/09/2013   Procedure: PERCUTANEOUS CORONARY STENT INTERVENTION (PCI-S);  Surgeon: Burnell Blanks, MD;  Location[facility] CATH LAB;  Service: Cardiovascular;  Laterality: N/A;  .  PERMANENT PACEMAKER INSERTION N/A 08/21/2012   Procedure: PERMANENT PACEMAKER INSERTION;  Surgeon: Evans Lance, MD;  Location: [facility] CATH LAB;  Service: Cardiovascular;  Laterality: N/A;  . TEE WITHOUT CARDIOVERSION N/A 12/07/2015   Procedure: TRANSESOPHAGEAL ECHOCARDIOGRAM (TEE);  Surgeon: Larey Dresser, MD;  Location: [place];  Service: Cardiovascular;  Laterality: N/A;  . TOTAL ABDOMINAL HYSTERECTOMY W/ BILATERAL SALPINGOOPHORECTOMY  2000  . TOTAL MASTECTOMY Left 05/17/2015   Procedure: LEFT TOTAL MASTECTOMY;  Surgeon: Rolm Bookbinder, MD;  Location: [place];  Service: General;  Laterality: Left;  . TUMOR EXCISION  1981   removal of Hodgkins lymphoma    SOCIAL HISTORY: Social History   Social History  . Marital status: Married    Spouse name: N/A  . Number of children: 2  . Years of education: N/A   Occupational History  . retired     Equities trader principal   Social History Main Topics  . Smoking status: Never Smoker  . Smokeless tobacco: Never Used  . Alcohol use No  . Drug use: No  . Sexual activity: Yes   Other Topics Concern  .  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 Heart attack Mother   . Heart disease Mother   . Heart attack Father   . Diabetes Father   . Heart disease Father   . Lung cancer Sister        meta  . Breast cancer Sister        half sister (Paternal)  . Ovarian cancer Sister   . Cervical cancer Sister        half sister (maternal)  . Hypertension Brother   . Glaucoma Brother   . Colon cancer Neg Hx   . Stomach cancer Neg Hx     ALLERGIES:  is allergic to avandia [rosiglitazone maleate]; cephalexin; clindamycin hcl; lincomycin; metformin; novolog [insulin aspart]; penicillins; tizanidine; adhesive [tape]; atorvastatin; cholestyramine; crestor [rosuvastatin]; gentamycin [gentamicin]; humalog  [insulin lispro]; iodinated diagnostic agents; limonene; pravastatin; prednisone; simvastatin; strawberry extract; plavix [clopidogrel bisulfate]; codeine; erythromycin; erythromycin base; hydrocodone-acetaminophen; latex; neomycin; nickel; ranitidine; tamiflu [oseltamivir]; and tramadol.  MEDICATIONS:  Current Outpatient Prescriptions  Medication Sig Dispense Refill  . Alirocumab (PRALUENT [place]) Inject 150 mLs into the skin every 14 (fourteen) days.    . ALPRAZolam (XANAX) 0.5 MG tablet Take 0.5 mg by mouth at bedtime. May take additional dose daily for anxiety    . amLODipine (NORVASC) 5 MG tablet Take 1 tablet (5 mg total) by mouth daily. 90 tablet 3  . APIDRA 100 UNIT/ML injection INFUSE THROUGH INSULIN PUMP UTD. Has been receiving 82-125 units daily over last 10 days.  Asked pt to bring with her on admission 10/25  2  . aspirin EC 81 MG tablet Take 81 mg by mouth at bedtime.    Marland Kitchen azithromycin (ZITHROMAX) 500 MG tablet 1 tablet by mouth one hour prior to dental appointment/procedure 2 tablet 0  . BRILINTA 60 MG TABS tablet TAKE 1 TABLET(60 MG) BY MOUTH TWICE DAILY 180 tablet 2  . Calcium Carbonate-Vitamin D (RA CALCIUM PLUS VITAMIN D) 600-400 MG-UNIT per tablet Take 1 tablet by mouth daily.     . Cholecalciferol 2000 UNITS TABS Take 1 tablet by mouth daily.    . clobetasol (TEMOVATE) 0.05 % external solution Apply 1 application topically daily as needed (for hives). Reported on 12/05/2015    . colchicine 0.6 MG tablet Take 0.6 mg by mouth daily. And,Take 1 tablet at onset and repeate in 1 hour and then 1 daily x 2 (FOR FLARES)    . Fe Fum-FA-B Cmp-C-Zn-Mg-Mn-Cu (HEMATINIC PLUS COMPLEX) 106-1 MG TABS Take 1 tablet oraly three times a week or as directed daily with with a Vitamin C tablet 60 each 0  . glucagon (GLUCAGON EMERGENCY) 1 MG injection Inject 1 mg into the vein once as needed (for severe reaction). Reported on 12/05/2015    . isosorbide mononitrate (IMDUR) 60 MG 24 hr tablet TAKE 1 TABLET  BY MOUTH ONCE DAILY 90 tablet 3  . latanoprost (XALATAN) 0.005 % ophthalmic solution Place 1 drop into both eyes at bedtime.    . Magnesium Oxide 250 MG TABS Take 250 mg by mouth daily.    . metoprolol (LOPRESSOR) 50 MG tablet TAKE 1 AND 1/2 TABLETS(75 MG) BY MOUTH TWICE DAILY 270 tablet 3  . Multiple Vitamins-Minerals (MULTIVITAMIN WITH MINERALS) tablet Take 1 tablet by mouth daily.      . nitroGLYCERIN (NITROLINGUAL) 0.4 MG/SPRAY spray Place 1  spray under the tongue every 5 (five) minutes as needed for chest pain. 12 g 1  . Omega-3 Fatty Acids (FISH OIL) 1000 MG CAPS Take 1,000 mg by mouth daily.     Marland Kitchen omeprazole (PRILOSEC) 20 MG capsule Take 20 mg by mouth every other day.     . potassium chloride (K-DUR,KLOR-CON) 10 MEQ tablet Take 6 tablets (60 mEq total) by mouth 2 (two) times daily. 240 tablet 3  . pravastatin (PRAVACHOL) 10 MG tablet TAKE 1 TABLET BY MOUTH ONCE DAILY 90 tablet 3  . torsemide (DEMADEX) 20 MG tablet Take 2 tablets (40 mg total) by mouth 2 (two) times daily. 120 tablet 3  . triamcinolone cream (KENALOG) 0.5 % Apply 1 application topically 3 (three) times daily.    Marland Kitchen ULORIC 40 MG tablet Take 40 mg by mouth daily.  11   No current facility-administered medications for this visit.     REVIEW OF SYSTEMS:   Constitutional: Denies fevers, chills or abnormal night sweats Eyes: Denies blurriness of vision, double vision or watery eyes Ears, nose, mouth, throat, and face: Denies mucositis or sore throat Respiratory: Denies cough, dyspnea or wheezes Cardiovascular: Denies palpitation, chest discomfort or lower extremity swelling Gastrointestinal:  Denies nausea, heartburn or change in bowel habits Skin: Denies abnormal skin rashes Lymphatics: Denies new lymphadenopathy or easy bruising Neurological:Denies numbness, tingling or new weaknesses Behavioral/Psych: Mood is stable, no new changes  Breast:  Denies any palpable lumps or discharge All other systems were reviewed with  the patient and are negative.  PHYSICAL EXAMINATION: ECOG PERFORMANCE STATUS: 1 - Symptomatic but completely ambulatory  Vitals:   12/13/16 1447  BP: (!) 124/50  Pulse: 74  Resp: 19  Temp: 97.6 F (36.4 C)   Filed Weights   12/13/16 1447  Weight: 122 lb 4.8 oz (55.5 kg)    GENERAL:alert, no distress and comfortable SKIN: skin color, texture, turgor are normal, no rashes or significant lesions EYES: normal, conjunctiva are pink and non-injected, sclera clear OROPHARYNX:no exudate, no erythema and lips, buccal mucosa, and tongue normal  NECK: supple, thyroid normal size, non-tender, without nodularity LYMPH:  no palpable lymphadenopathy in the cervical, axillary or inguinal LUNGS: clear to auscultation and percussion with normal breathing effort HEART: regular rate & rhythm and no murmurs and no lower extremity edema ABDOMEN:abdomen soft, non-tender and normal bowel sounds Musculoskeletal:no cyanosis of digits and no clubbing  PSYCH: alert & oriented x 3 with fluent speech NEURO: no focal motor/sensory deficits  LABORATORY DATA:  I have reviewed the data as listed Lab Results  Component Value Date   WBC 8.0 12/13/2016   HGB 13.4 12/13/2016   HCT 40.3 12/13/2016   MCV 88.8 12/13/2016   PLT 107 (L) 12/13/2016   Lab Results  Component Value Date   NA 142 12/13/2016   K 4.4 12/13/2016   CL 103 11/30/2016   CO2 28 12/13/2016    RADIOGRAPHIC STUDIES: I have personally reviewed the radiological reports and agreed with the findings in the report.  ASSESSMENT AND PLAN:  Hodgkin's disease ([place]) 1. 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 Hansel Feinstein followed by radiation-induced hypothyroidism and coronary artery disease 2. DCIS status post mastectomy 2016: We discussed the surveillance for DCIS with annual breast exams and annual mammograms. 06/11/2007 Right nephrectomy for renal cell carcinoma in 2008 T1b NX chromophobe  variant Fuhrman nuclear grade 3-4  Recent problems with bleeding related to Brilinta. 3. Elevated ferritin could  be related to underlying inflammation. Awaiting the complete iron panel to be drawn today. I tried to reassure the patient that the elevated ferritin is not significantly elevated to cause her any concerns. Iron saturation of 25% does not support the diagnosis of iron overload. I do not intend to do hemochromatosis workup.  4. Because of her history of multiple cancers, I recommended that she see genetic counseling.  Return to clinic on as-needed basis.   All questions were answered. The patient knows to call the clinic with any problems, questions or concerns.    Rulon Eisenmenger, MD 12/13/16

## 2016-12-16 ENCOUNTER — Encounter (HOSPITAL_COMMUNITY): Payer: Self-pay | Admitting: Emergency Medicine

## 2016-12-16 ENCOUNTER — Telehealth: Payer: Self-pay | Admitting: Cardiology

## 2016-12-16 ENCOUNTER — Emergency Department (HOSPITAL_COMMUNITY): Payer: Medicare Other

## 2016-12-16 ENCOUNTER — Emergency Department (HOSPITAL_COMMUNITY)
Admission: EM | Admit: 2016-12-16 | Discharge: 2016-12-17 | Disposition: A | Discharge status: Home / Self Care | Payer: Medicare Other | Attending: Emergency Medicine | Admitting: Emergency Medicine

## 2016-12-16 ENCOUNTER — Other Ambulatory Visit: Payer: Self-pay

## 2016-12-16 DIAGNOSIS — J90 Pleural effusion, not elsewhere classified: Secondary | ICD-10-CM | POA: Diagnosis not present

## 2016-12-16 DIAGNOSIS — I480 Paroxysmal atrial fibrillation: Secondary | ICD-10-CM | POA: Diagnosis present

## 2016-12-16 DIAGNOSIS — I5033 Acute on chronic diastolic (congestive) heart failure: Secondary | ICD-10-CM | POA: Diagnosis not present

## 2016-12-16 DIAGNOSIS — E785 Hyperlipidemia, unspecified: Secondary | ICD-10-CM | POA: Diagnosis present

## 2016-12-16 DIAGNOSIS — Z79899 Other long term (current) drug therapy: Secondary | ICD-10-CM | POA: Insufficient documentation

## 2016-12-16 DIAGNOSIS — R0981 Nasal congestion: Secondary | ICD-10-CM | POA: Insufficient documentation

## 2016-12-16 DIAGNOSIS — N183 Chronic kidney disease, stage 3 (moderate): Secondary | ICD-10-CM | POA: Diagnosis not present

## 2016-12-16 DIAGNOSIS — Z950 Presence of cardiac pacemaker: Secondary | ICD-10-CM | POA: Diagnosis present

## 2016-12-16 DIAGNOSIS — I25810 Atherosclerosis of coronary artery bypass graft(s) without angina pectoris: Secondary | ICD-10-CM | POA: Insufficient documentation

## 2016-12-16 DIAGNOSIS — I130 Hypertensive heart and chronic kidney disease with heart failure and stage 1 through stage 4 chronic kidney disease, or unspecified chronic kidney disease: Secondary | ICD-10-CM | POA: Diagnosis not present

## 2016-12-16 DIAGNOSIS — C649 Malignant neoplasm of unspecified kidney, except renal pelvis: Secondary | ICD-10-CM | POA: Diagnosis present

## 2016-12-16 DIAGNOSIS — Z951 Presence of aortocoronary bypass graft: Secondary | ICD-10-CM | POA: Insufficient documentation

## 2016-12-16 DIAGNOSIS — Z7982 Long term (current) use of aspirin: Secondary | ICD-10-CM | POA: Insufficient documentation

## 2016-12-16 DIAGNOSIS — R0602 Shortness of breath: Secondary | ICD-10-CM | POA: Diagnosis present

## 2016-12-16 DIAGNOSIS — E1122 Type 2 diabetes mellitus with diabetic chronic kidney disease: Secondary | ICD-10-CM | POA: Diagnosis not present

## 2016-12-16 DIAGNOSIS — I350 Nonrheumatic aortic (valve) stenosis: Secondary | ICD-10-CM

## 2016-12-16 LAB — CBG MONITORING, ED: Glucose-Capillary: 57 mg/dL — ABNORMAL LOW (ref 65–99)

## 2016-12-16 NOTE — Telephone Encounter (Signed)
Patient called stating that she has been retaining a lot of fluid and her pulse ox read 87-90% (normally 98-99%).  She normally takes Demadex 93m and then 421min the afternoon.   She took an extra Demadex 2068must a while ago.  She is SOB with talking and has some gurgling in her throat.  Her weight usually is 117lbs but weight this am 120lbs.  [facility]ed [facility] to [facility] since it is the closets to be evaluated and we will proceed from there once she is evaluated.  She agrees with plan.

## 2016-12-16 NOTE — ED Triage Notes (Signed)
Sob since this morning.  Hx of CHF.  Pt states she feels like she is holding onto fluid

## 2016-12-17 ENCOUNTER — Emergency Department (HOSPITAL_COMMUNITY): Payer: Medicare Other

## 2016-12-17 DIAGNOSIS — I5033 Acute on chronic diastolic (congestive) heart failure: Secondary | ICD-10-CM

## 2016-12-17 LAB — CBC WITH DIFFERENTIAL/PLATELET
BASOS ABS: 0 10*3/uL (ref 0.0–0.1)
Basophils Relative: 0 %
EOS PCT: 1 %
Eosinophils Absolute: 0.1 10*3/uL (ref 0.0–0.7)
HCT: 41.7 % (ref 36.0–46.0)
Hemoglobin: 14 g/dL (ref 12.0–15.0)
LYMPHS ABS: 1.9 10*3/uL (ref 0.7–4.0)
LYMPHS PCT: 18 %
MCH: 30.2 pg (ref 26.0–34.0)
MCHC: 33.6 g/dL (ref 30.0–36.0)
MCV: 89.9 fL (ref 78.0–100.0)
MONO ABS: 0.8 10*3/uL (ref 0.1–1.0)
MONOS PCT: 8 %
Neutro Abs: 7.4 10*3/uL (ref 1.7–7.7)
Neutrophils Relative %: 73 %
PLATELETS: 98 10*3/uL — AB (ref 150–400)
RBC: 4.64 MIL/uL (ref 3.87–5.11)
RDW: 14.3 % (ref 11.5–15.5)
WBC: 10.3 10*3/uL (ref 4.0–10.5)

## 2016-12-17 LAB — URINALYSIS, ROUTINE W REFLEX MICROSCOPIC
BILIRUBIN URINE: NEGATIVE
GLUCOSE, UA: 50 mg/dL — AB
HGB URINE DIPSTICK: NEGATIVE
KETONES UR: NEGATIVE mg/dL
Leukocytes, UA: NEGATIVE
Nitrite: NEGATIVE
PH: 7 (ref 5.0–8.0)
PROTEIN: NEGATIVE mg/dL
Specific Gravity, Urine: 1.005 (ref 1.005–1.030)

## 2016-12-17 LAB — CBG MONITORING, ED: Glucose-Capillary: 130 mg/dL — ABNORMAL HIGH (ref 65–99)

## 2016-12-17 LAB — TROPONIN I: Troponin I: <0.03 ng/mL (ref <0.03)

## 2016-12-17 LAB — BRAIN NATRIURETIC PEPTIDE: B NATRIURETIC PEPTIDE 5: 180 pg/mL — AB (ref 0.0–100.0)

## 2016-12-17 LAB — BASIC METABOLIC PANEL
Anion gap: 12 (ref 5–15)
BUN: 35 mg/dL — AB (ref 6–20)
CO2: 29 mmol/L (ref 22–32)
Calcium: 10.1 mg/dL (ref 8.9–10.3)
Chloride: 102 mmol/L (ref 101–111)
Creatinine, Ser: 1.11 mg/dL — ABNORMAL HIGH (ref 0.44–1.00)
GFR calc Af Amer: 60 mL/min — ABNORMAL LOW (ref >60)
GFR, EST NON AFRICAN AMERICAN: 52 mL/min — AB (ref >60)
GLUCOSE: 119 mg/dL — AB (ref 65–99)
POTASSIUM: 4.3 mmol/L (ref 3.5–5.1)
Sodium: 143 mmol/L (ref 135–145)

## 2016-12-17 MED ORDER — DOXYCYCLINE HYCLATE 100 MG PO CAPS: 100.0000 mg | ORAL_CAPSULE | Freq: Two times a day (BID) | ORAL | 0 refills | Status: DC

## 2016-12-17 MED ORDER — LEVOFLOXACIN IN D5W 500 MG/100ML IV SOLN: 500.0000 mg | Freq: Once | INTRAVENOUS | Status: DC

## 2016-12-17 MED ORDER — ALBUTEROL SULFATE (2.5 MG/3ML) 0.083% IN NEBU: 2.5000 mg | INHALATION_SOLUTION | Freq: Once | RESPIRATORY_TRACT | Status: DC

## 2016-12-17 MED ORDER — DOXYCYCLINE HYCLATE 100 MG PO TABS
100.0000 mg | ORAL_TABLET | Freq: Once | ORAL | Status: AC
  Administered 2016-12-17: 100 mg via ORAL
  Filled 2016-12-17: qty 1

## 2016-12-17 MED ORDER — FUROSEMIDE 10 MG/ML IJ SOLN
40.0000 mg | Freq: Once | INTRAMUSCULAR | Status: AC
  Administered 2016-12-17: 40 mg via INTRAVENOUS
  Filled 2016-12-17: qty 4

## 2016-12-17 NOTE — ED Provider Notes (Signed)
Patient seen with [name] [name]. Patient with sudden onset difficulty breathing this evening. Denies chest pain, cough, fever. Patient with history of diastolic heart failure, aortic valve replacement, previous CABG and pacemaker. Also has history of Hodgkin's disease and renal cell carcinoma in remission. Patient felt improved after taking extra Demadex at home. Denies any change in her weight or leg swelling. Called her cardiologist and was told to come to the hospital.  Patient in no distress. No hypoxia. She has decreased breath sounds in the right base. Systolic murmur. No peripheral edema. She is speaking in full sentences.  Unchanged. Chest x-ray shows right-sided pleural effusion.  Patient given IV Lasix in the ED. She is ambulatory without desaturation. Suspect her shortness of breath is due to diastolic heart failure exacerbation. Doubt ACS, doubt PE.  hospitalist consulted for admission. Patient seen by Dr. Marin Comment who evaluated patient and feel she is stable for outpatient diuresis. We'll also give antibiotics for possible pneumonia component. Increase diuretics for next two days. Follow up with cardiologist. Return precautions discussed.    Ezequiel Essex, MD 12/17/16 [phone]

## 2016-12-17 NOTE — ED Notes (Signed)
Pt given peanut butter and 16oz orange juice.

## 2016-12-17 NOTE — ED Notes (Signed)
Patient transported to X-ray 

## 2016-12-17 NOTE — ED Notes (Signed)
Pt O2 did not fall below 96% while ambulating, pt did state she was "winded"

## 2016-12-17 NOTE — Discharge Instructions (Signed)
You may increase your torsemide to 60 mg twice daily for the next 2 days and then go back to 40 mg twice daily. You Should have another chest x-ray in 1 week to ensure that this abnormality is improving. Take antibiotics as prescribed. Follow-up with your doctor. Return to the ED if he develop worsening shortness of breath, chest pain or any other concerns.

## 2016-12-17 NOTE — ED Provider Notes (Signed)
[facility] DEPT Provider Note   CSN: [id number] Arrival date & time: 12/16/16  2200     History   Chief Complaint Chief Complaint  Patient presents with  . Shortness of Breath    HPI [name] [name] is a 66 y.o. female.  Patient is a 66 year old female who presents to the emergency department with complaint of shortness of breath.  Primary care physician is Dr.Golding.  Patient has a history of coronary artery disease, requiring coronary artery bypass graft surgery, and bypass redo. Patient has aortic stenosis,. She's had a renal cell carcinoma, as well as Hodgkin's disease. She suffers from arthritis, and chronic kidney disease. She's had duodenal ulcers, and iron deficiency anemia. She is an insulin requiring diabetic.  The patient states that she is been more active and busy than usual. She has been preparing breakfast and dinner for approximately 50 people who have come to the area for the holiday weekend. She states that she follows her diet rather strictly, she did have a strip bacon, and a handful of goldfish crackers on today, otherwise she follows her diet closely. Patient states that she was watching TV this afternoon around 7 PM when she began to have difficulty with breathing. This problem continued and so she took an additional tablet of Demadex. It seemed as though this was slow to response or she spoke with the cardiology specialist on call, and was told to come to the emergency department for additional evaluation and she reported to cardiology that her pulse oximetry is running between 87 and 91 on her home machine. The patient's husband stated that she sounded as though she was short of breath and sound as though she could not get a complete sentence out during this particular time.  The patient states that since taking the additional Demadex she has been going to the bathroom quite frequently diuresing. She states that now she is at a hospital she is speaking  better, she feels like she is breathing better as well. It is of note she had a similar situation approximately 2 weeks ago, however this seemed to resolve without any significant problem. There was no chest pain, no unusual sweats, no nausea vomiting, and no loss of consciousness reported on during the shortness of breath episode. The patient presents now for assistance with this issue.     The history is provided by the patient.    Past Medical History:  Diagnosis Date  . Anxiety   . Aortic stenosis    a. bicuspid aortic valve; mean gradient of 20 mmHg in 2/10; b. s/p bioprosth AVR (19-mm Edwards pericardial valve) with a redo coronary artery bypass graft procedure in 07/2009; postoperative Dressler's syndrome    . Arthritis   . Carcinoma, renal cell ([place]) 05/2007   Laparoscopic right nephrectomy  . Chronic diastolic CHF (congestive heart failure) ([place])    a. 9.2014 EchoP EF 60-65%, no rwma, bioprosth AVR, mean gradient of 54mHg, mild to mod MR, PASP 569mg.  . CKD (chronic kidney disease), stage II   . Colitis, ischemic ([place]  . Coronary artery disease    a. initial CABG with SVG-RCA in 12/05. b. redo SVG-RCA and bioprosthetic AVR in 1/11. d. 9/14 - PCI with DES to distal LM/proximal LAD was done rather than bypass as she would have been a poor candidate for redo sternotomy. d. S/p DES to prox LM 05/2014.  . Diabetes mellitus 03/2010    TYPE II: Hemoglobin A1c of 7.4 in  03/2010; 8.4  in 06/2010; treated with insulin pump  . Duodenal ulcer    Remote; H. pylori positive  . Gastroparesis due to DM ([place]) 05/01/2012  . Hodgkin's disease ([place]) 1991   [place] radiation therapy  . Hyperlipidemia   . Hyperplastic gastric polyp 06/23/2012  . Hypertension   . Iron deficiency anemia    a. 03/2013 EGD: essentially normal. Possible slow GIB.  . Migraines   . NASH (nonalcoholic steatohepatitis) 1999   -biopsy in 1999  . Osteopenia     hip on DEXA in October 2007.  . Osteoporosis   . PAF  (paroxysmal atrial fibrillation) ([place])    a.  h/o brief episodes noted on ppm interrogation. b. given GI bleeding and need to be on both ASA 81 and Brilinta, risks likely outweigh benefits of anticoagulation.   . Small bowel obstruction (HCC)    Recurrent; resolved after resection of a lipoma  . Syncope    a. H/o recurrent syncope with pauses on loop recorder s/p Medtronic pacemaker 07/2012.    Patient Active Problem List   Diagnosis Date Noted  . S/P left mastectomy 07/29/2016  . Dense breast 12/11/2015  . Hx of iron deficiency anemia 12/11/2015  . Chronic anticoagulation 12/11/2015  . Breast cancer screening, high risk patient 12/11/2015  . Type 1 diabetes mellitus ([place]) 11/17/2015  . 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 Hx of radiation therapy 04/23/2015  . Thrombocytopenia ([place]) 04/23/2015  . 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 Osteopenia determined by x-ray 04/23/2015  . DM (diabetes mellitus) with complications ([place]) 78/29/5621  . Radiation therapy complication 30/86/5784  . History of nodular sclerosis Hodgkin's disease 10/27/2014  . Iron deficiency anemia due to chronic blood loss 10/27/2014  . Screening mammogram for high-risk patient 10/27/2014  . Insulin dependent type 1 diabetes mellitus ([place]) 10/07/2014  . Diabetic gastroparesis ([place]) 07/30/2014  . Claudication ([place]) 07/29/2014  . PAF (paroxysmal atrial fibrillation) ([place])   . CKD (chronic kidney disease), stage II   . Coronary artery disease involving native coronary artery of native heart with unstable angina pectoris ([place])   . Type 2 diabetes mellitus with complication ([place])   . Abdominal bloating 06/09/2013  . Feeling bilious 06/09/2013  . Iron deficiency anemia 04/11/2013  . Unstable angina ([place]) 04/11/2013  . CAD (coronary artery disease) of artery bypass graft 04/03/2013  . Chronic diastolic CHF (congestive  heart failure) ([place]) 04/03/2013  . Anemia 04/03/2013  . Arterial vascular disease 03/12/2013  . Aorta disorder ([place]) 03/12/2013  . Atrial fibrillation ([place]) 03/12/2013  . Artificial cardiac pacemaker 03/12/2013  . CCF (congestive cardiac failure) ([place]) 03/12/2013  . Chronic kidney disease (CKD), stage III (moderate) 03/12/2013  . Gastric reflux 03/12/2013  . 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 Big thyroid 03/12/2013  . H/O Hodgkin's lymphoma 03/12/2013  . BP (high blood pressure) 03/12/2013  . Fitting and adjustment of insulin pump 03/12/2013  . Disorder of bone and cartilage, unspecified 03/12/2013  . Aortic heart valve narrowing 03/12/2013  . Encounter for fitting or adjustment of insulin pump 03/12/2013  . Congestive heart failure ([place]) 03/12/2013  . Ischemic colitis ([place]) 03/12/2013  . Lipoma of neck 09/23/2012  . Pacemaker 09/08/2012  . Chronic kidney disease, stage 3/cardiorenal syndrome 08/15/2012  . Atrial flutter ([place]) 07/23/2012  . Sick sinus syndrome ([place]) 07/21/2012  . Hyperplastic gastric polyp 06/23/2012  . Gastroparesis due to DM ([place]) 05/01/2012  . Hypercalcemia   .  Cholelithiasis 07/20/2011  . Aortic stenosis status post 19 mm Edwards pericardial valve replacement 2011   . Arteriosclerotic cardiovascular disease (ASCVD)   . Osteopenia   . Anemia, normocytic normochromic   . NASH (nonalcoholic steatohepatitis)   . Renal cell carcinoma (HCC)     Class: History of  . Duodenal ulcer     Class: History of  . Ischemic colitis     Class: History of  . Gastroesophageal reflux disease   . Hodgkin's disease ([place])   . Hypertension   . Small bowel obstruction ([place])   . Hyperlipidemia 04/11/2010  . Diabetes mellitus type 2, controlled ([place]) 03/23/2010  . Colonic polyps, adenomatous 09/29/2009    Past Surgical History:  Procedure Laterality Date  . AORTIC VALVE REPLACEMENT  2011   Aortic valve replacement surgery with a 19 mm bioprosthetic device post redo CABG January  2011.  Marland Kitchen BONE MARROW BIOPSY    . BREAST EXCISIONAL BIOPSY     benign, bilateral  . CERVICAL BIOPSY    . COLONOSCOPY     Multiple with adenomatous polyps  . COLONOSCOPY  06/23/2012   Procedure: COLONOSCOPY;  Surgeon: Gatha Mayer, MD;  Location: [facility] ENDOSCOPY;  Service: Endoscopy;  Laterality: N/A;  . CORONARY ARTERY BYPASS GRAFT  2005, 2011   CABG-most recent SVG to RCA-12/05; RCA occluded with patent graft in 2006; Redo bypass in 07/2009  . ESOPHAGOGASTRODUODENOSCOPY    . ESOPHAGOGASTRODUODENOSCOPY  06/23/2012   Procedure: ESOPHAGOGASTRODUODENOSCOPY (EGD);  Surgeon: Gatha Mayer, MD;  Location: [facility];  Service: Endoscopy;  Laterality: N/A;  . ESOPHAGOGASTRODUODENOSCOPY N/A 04/06/2013   Procedure: ESOPHAGOGASTRODUODENOSCOPY (EGD);  Surgeon: Irene Shipper, MD;  Location: [facility] ENDOSCOPY;  Service: Endoscopy;  Laterality: N/A;  . EXPLORATORY LAPAROTOMY W/ BOWEL RESECTION     Small bowel resection of lipoma  . GIVENS CAPSULE STUDY N/A 04/30/2013   Procedure: GIVENS CAPSULE STUDY;  Surgeon: Gatha Mayer, MD;  Location: [facility];  Service: Endoscopy;  Laterality: N/A;  . LEFT AND RIGHT HEART CATHETERIZATION WITH CORONARY ANGIOGRAM N/A 04/07/2013   Procedure: LEFT AND RIGHT HEART CATHETERIZATION WITH CORONARY ANGIOGRAM;  Surgeon: Larey Dresser, MD;  Location: [facility] CATH LAB;  Service: Cardiovascular;  Laterality: N/A;  . LEFT ATRIAL APPENDAGE OCCLUSION N/A 02/16/2016   Watchman not placed - nickel allergy  . LEFT HEART CATHETERIZATION WITH CORONARY/GRAFT ANGIOGRAM N/A 06/07/2014   Procedure: LEFT HEART CATHETERIZATION WITH Beatrix Fetters;  Surgeon: Burnell Blanks, MD;  Location: [facility] CATH LAB;  Service: Cardiovascular;  Laterality: N/A;  . LIVER BIOPSY    . LOOP RECORDER IMPLANT N/A 12/10/2011   Procedure: LOOP RECORDER IMPLANT;  Surgeon: Evans Lance, MD;  Location: [facility] CATH LAB;  Service: Cardiovascular;  Laterality: N/A;  . MALONEY DILATION  06/23/2012   Procedure:  [name] DILATION;  Surgeon: Gatha Mayer, MD;  Location: [facility] ENDOSCOPY;  Service: Endoscopy;;  54 fr  . MASTECTOMY Left 05/17/2015   total   . NEPHRECTOMY  2008   Laparoscopic right nephrectomy, renal cell carcinoma  . PACEMAKER INSERTION  08/21/2012   Medtronic Adapta L dual-chamber pacemaker  . PERCUTANEOUS CORONARY STENT INTERVENTION (PCI-S) N/A 04/09/2013   Procedure: PERCUTANEOUS CORONARY STENT INTERVENTION (PCI-S);  Surgeon: Burnell Blanks, MD;  Location: [facility] CATH LAB;  Service: Cardiovascular;  Laterality: N/A;  . PERMANENT PACEMAKER INSERTION N/A 08/21/2012   Procedure: PERMANENT PACEMAKER INSERTION;  Surgeon: Evans Lance, MD;  Location: [facility] CATH LAB;  Service: Cardiovascular;  Laterality: N/A;  . TEE WITHOUT CARDIOVERSION  N/A 12/07/2015   Procedure: TRANSESOPHAGEAL ECHOCARDIOGRAM (TEE);  Surgeon: Larey Dresser, MD;  Location: [facility];  Service: Cardiovascular;  Laterality: N/A;  . TOTAL ABDOMINAL HYSTERECTOMY W/ BILATERAL SALPINGOOPHORECTOMY  2000  . TOTAL MASTECTOMY Left 05/17/2015   Procedure: LEFT TOTAL MASTECTOMY;  Surgeon: Rolm Bookbinder, MD;  Location: [facility];  Service: General;  Laterality: Left;  . TUMOR EXCISION  1981   removal of Hodgkins lymphoma    OB History    No data available       Home Medications    Prior to Admission medications   Medication Sig Start Date End Date Taking? Authorizing Provider  Alirocumab (PRALUENT [place]) Inject 150 mLs into the skin every 14 (fourteen) days.   Yes [provider]  ALPRAZolam Duanne Moron) 0.5 MG tablet Take 0.5 mg by mouth at bedtime. May take additional dose daily for anxiety   Yes [provider]  amLODipine (NORVASC) 5 MG tablet Take 1 tablet (5 mg total) by mouth daily. 05/14/16  Yes Larey Dresser, MD  APIDRA 100 UNIT/ML injection Inject 46 Units into the skin as directed. INFUSE THROUGH INSULIN PUMP UTD::[place] 46.15 units daily, Bolus depends on carb intake 02/04/15  Yes [provider]  aspirin EC 81 MG tablet Take 81 mg by mouth at bedtime.   Yes [provider]  azithromycin (ZITHROMAX) 500 MG tablet 1 tablet by mouth one hour prior to dental appointment/procedure 12/21/13  Yes Larey Dresser, MD  BRILINTA 60 MG TABS tablet TAKE 1 TABLET(60 MG) BY MOUTH TWICE DAILY 06/28/16  Yes Larey Dresser, MD  Calcium Carbonate-Vitamin D (RA CALCIUM PLUS VITAMIN D) 600-400 MG-UNIT per tablet Take 1 tablet by mouth daily.    Yes [provider]  Cholecalciferol 2000 UNITS TABS Take 1 tablet by mouth every morning.    Yes [provider]  clobetasol (TEMOVATE) 0.05 % external solution Apply 1 application topically daily as needed (for hives). Reported on 12/05/2015   Yes [provider]  Coenzyme Q10 (CO Q 10 PO) Take 1 tablet by mouth at bedtime.   Yes [provider]  colchicine 0.6 MG tablet Take 0.6 mg by mouth at bedtime. And,Take 1 tablet at onset and repeate in 1 hour and then 1 daily x 2 (FOR FLARES)    Yes [provider]  Fe Fum-FA-B Cmp-C-Zn-Mg-Mn-Cu (HEMATINIC PLUS COMPLEX) 106-1 MG TABS Take 1 tablet oraly three times a week or as directed daily with with a Vitamin C tablet Patient taking differently: Take 1 tablet by mouth every Monday, Wednesday, and Friday. Take 1 tablet oraly three times a week or as directed daily with with a Vitamin C tablet 07/26/16  Yes Livesay, Lennis P, MD  glucagon (GLUCAGON EMERGENCY) 1 MG injection Inject 1 mg into the vein once as needed (for severe reaction). Reported on 12/05/2015   Yes [provider]  isosorbide mononitrate (IMDUR) 60 MG 24 hr tablet TAKE 1 TABLET BY MOUTH ONCE DAILY Patient taking differently: TAKE 1 TABLET BY MOUTH ONCE DAILY AT BEDTIME 07/24/16  Yes Larey Dresser, MD  latanoprost (XALATAN) 0.005 % ophthalmic solution Place 1 drop into both eyes at bedtime.   Yes [provider]  Magnesium Oxide 250 MG TABS Take 250 mg by mouth at bedtime.     Yes [provider]  metoprolol (LOPRESSOR) 50 MG tablet TAKE 1 AND 1/2 TABLETS(75 MG) BY MOUTH TWICE DAILY 02/23/16  Yes Evans Lance, MD  Multiple Vitamins-Minerals (MULTIVITAMIN WITH  MINERALS) tablet Take 1 tablet by mouth every morning.    Yes [provider]  nitroGLYCERIN (NITROLINGUAL) 0.4 MG/SPRAY spray Place 1 spray under the tongue every 5 (five) minutes as needed for chest pain. 05/14/16  Yes Larey Dresser, MD  Omega-3 Fatty Acids (FISH OIL) 1000 MG CAPS Take 1,000 mg by mouth at bedtime.    Yes [provider]  omeprazole (PRILOSEC) 20 MG capsule Take 20 mg by mouth every other day. TAKES IN THE EVENING   Yes [provider]  potassium chloride (K-DUR,KLOR-CON) 10 MEQ tablet Take 6 tablets (60 mEq total) by mouth 2 (two) times daily. 10/22/16  Yes Larey Dresser, MD  pravastatin (PRAVACHOL) 10 MG tablet TAKE 1 TABLET BY MOUTH ONCE DAILY Patient taking differently: TAKE 1 TABLET BY MOUTH ONCE DAILY AT BEDTIME 03/29/16  Yes Larey Dresser, MD  torsemide (DEMADEX) 20 MG tablet Take 2 tablets (40 mg total) by mouth 2 (two) times daily. 08/24/16  Yes Larey Dresser, MD  ULORIC 40 MG tablet Take 40 mg by mouth at bedtime.  03/15/16  Yes [provider]  triamcinolone cream (KENALOG) 0.5 % Apply 1 application topically 3 (three) times daily.    [provider]    Family History Family History  Problem Relation Age of Onset  . Heart attack Mother   . Heart disease Mother   . Heart attack Father   . Diabetes Father   . Heart disease Father   . Lung cancer Sister        meta  . Breast cancer Sister        half sister (Paternal)  . Ovarian cancer Sister   . Cervical cancer Sister        half sister (maternal)  . Hypertension Brother   . Glaucoma Brother   . Colon cancer Neg Hx   . Stomach cancer Neg Hx     Social History Social History  Substance Use Topics  . Smoking status: Never Smoker  . Smokeless tobacco: Never Used   . Alcohol use No     Allergies   Avandia [rosiglitazone maleate]; Cephalexin; Clindamycin hcl; Lincomycin; Metformin; Novolog [insulin aspart]; Penicillins; Tizanidine; Adhesive [tape]; Atorvastatin; Cholestyramine; Crestor [rosuvastatin]; Gentamycin [gentamicin]; Humalog [insulin lispro]; Iodinated diagnostic agents; Limonene; Pravastatin; Prednisone; Simvastatin; Strawberry extract; Plavix [clopidogrel bisulfate]; Codeine; Erythromycin; Erythromycin base; Hydrocodone-acetaminophen; Latex; Neomycin; Nickel; Ranitidine; Tamiflu [oseltamivir]; and Tramadol   Review of Systems Review of Systems  Constitutional: Positive for activity change and fatigue. Negative for appetite change, chills, diaphoresis and fever.  HENT: Positive for congestion. Negative for ear discharge, ear pain, facial swelling, nosebleeds, rhinorrhea, sneezing and tinnitus.   Eyes: Negative for photophobia, pain and discharge.  Respiratory: Positive for shortness of breath. Negative for cough, choking and wheezing.   Cardiovascular: Negative for chest pain, palpitations and leg swelling.  Gastrointestinal: Negative for abdominal distention, abdominal pain, blood in stool, constipation, diarrhea, nausea and vomiting.  Genitourinary: Negative for difficulty urinating, dysuria, flank pain, frequency and hematuria.  Musculoskeletal: Negative for back pain, gait problem, myalgias and neck pain.  Skin: Negative for color change, rash and wound.  Neurological: Negative for dizziness, seizures, syncope, facial asymmetry, speech difficulty, weakness and numbness.  Hematological: Negative for adenopathy. Does not bruise/bleed easily.  Psychiatric/Behavioral: Negative for agitation, confusion, hallucinations, self-injury and suicidal ideas. The patient is not nervous/anxious.      Physical Exam Updated Vital Signs BP (!) 136/47   Pulse 95   Temp 98.1 F (36.7 C) (  Oral)   Resp 16   Ht 5' 2"  (1.575 m)   Wt 54.7 kg (120 lb 8  oz)   SpO2 96%   BMI 22.04 kg/m   Physical Exam  Constitutional: Vital signs are normal. She appears well-developed and well-nourished. She is active.  HENT:  Head: Normocephalic and atraumatic.  Right Ear: Tympanic membrane, external ear and ear canal normal.  Left Ear: Tympanic membrane, external ear and ear canal normal.  Nose: Nose normal.  Mouth/Throat: Uvula is midline, oropharynx is clear and moist and mucous membranes are normal.  Eyes: Conjunctivae, EOM and lids are normal. Pupils are equal, round, and reactive to light.  Neck: Trachea normal, normal range of motion and phonation normal. Neck supple. Carotid bruit is not present.  There is a carotid bruit present on auscultation by stethoscope.  Cardiovascular: Normal rate, regular rhythm, S1 normal, S2 normal and normal pulses.   Murmur heard.  Systolic murmur is present with a grade of 2/6  Patient has a 2/6 harsh murmur at the right sternal border.  Pulmonary/Chest: Effort normal and breath sounds normal.  At this time the patient is speaking in complete sentences without problem. There is symmetrical rise and fall of the chest. The lungs are clear.  Abdominal: Soft. Normal appearance and bowel sounds are normal.  Musculoskeletal:  No pitting edema appreciated.  Lymphadenopathy:       Head (right side): No submental, no preauricular and no posterior auricular adenopathy present.       Head (left side): No submental, no preauricular and no posterior auricular adenopathy present.    She has no cervical adenopathy.  Neurological: She is alert. She has normal strength. No cranial nerve deficit or sensory deficit. GCS eye subscore is 4. GCS verbal subscore is 5. GCS motor subscore is 6.  Skin: Skin is warm and dry.  Psychiatric: Her speech is normal.     ED Treatments / Results  Labs (all labs ordered are listed, but only abnormal results are displayed) Labs Reviewed  URINALYSIS, ROUTINE W REFLEX MICROSCOPIC - Abnormal;  Notable for the following:       Result Value   Color, Urine STRAW (*)    Glucose, UA 50 (*)    All other components within normal limits  CBG MONITORING, ED - Abnormal; Notable for the following:    Glucose-Capillary 57 (*)    All other components within normal limits  BRAIN NATRIURETIC PEPTIDE  BASIC METABOLIC PANEL  CBC WITH DIFFERENTIAL/PLATELET  TROPONIN I    EKG  EKG Interpretation  Date/Time:  Sunday Dec 16 2016 23:55:54 EDT Ventricular Rate:  98 PR Interval:    QRS Duration: 86 QT Interval:  411 QTC Calculation: 525 R Axis:   -142 Text Interpretation:  Sinus rhythm with first degree AV block Inferior infarct, old Lateral leads are also involved Prolonged QT interval No significant change was found Confirmed by Ezequiel Essex [phone]) on 12/17/2016 12:14:49 AM       Radiology No results found.  Procedures Procedures (including critical care time)  Medications Ordered in ED Medications - No data to display   Initial Impression / Assessment and Plan / ED Course  I have reviewed the triage vital signs and the nursing notes.  Pertinent labs & imaging results that were available during my care of the patient were reviewed by me and considered in my medical decision making (see chart for details).     I have reviewed nursing notes, vital signs, and all appropriate  lab and imaging results for this patient.   Final Clinical Impressions(s) / ED Diagnoses MDM Vital signs within normal limits. Pulse oximetry is 94-96% on room air.  Patient noted on her insulin pump that her glucose was low at 57. Patient was given juice, crackers, peanut butter.  Pt seen by Dr Wyvonnia Dusky  The electrocardiogram shows a normal sinus rhythm . There is a prolonged QT interval, and there is no significant change from previous electrocardiogram noted at this time.  Recheck. The patient is in no distress. She speaks in complete sentences. Conversing easily with her significant other in the  room.  Glucose up to 130.  The been atretic peptide is elevated at 180. The basic metabolic panel shows the BUN to be elevated at 35, the creatinine elevated at 1.11. This is in the range that the patient usually fits this facility. The anion gap is normal at 12. The complete blood count shows the platelets to be low at 98,000, otherwise within normal limits. The troponin is negative for acute event at less than 0.03.  The urine analysis is nonacute. The chest x-ray shows a right-sided pleural effusion with compression atelectasis versus infiltrate in the right middle lobe and right lung base. A CT scan is obtained to further delineate these findings.   CT scan of the chest without contrast shows a small loculated on right-sided pleural effusion extending along the right minor fissure. There is no underlying mass seen. There is a mild hazy airspace opacity at the left upper and lower lung lobes raising concern for possible pneumonia. There is calcification along the thoracic aorta and at the aortic and mitral valves. There is diffuse coronary artery calcification seen.  Will ambulate the patient in the room and  [facility] and evaluate for desaturation. We'll discuss the case with the hospitalist for possible observation admission given the multiple and complex present history and past history for this patient.   Patient's care will be continued by Dr.Rancour.   Final diagnoses:  None    New Prescriptions New Prescriptions   No medications on file     Annette Stable 12/17/16 1745    Ezequiel Essex, MD 12/17/16 2111

## 2016-12-17 NOTE — ED Notes (Signed)
Pt placed insulin pump on "suspend"

## 2016-12-17 NOTE — ED Notes (Signed)
Pt ambulatory to waiting room. Pt verbalized understanding of discharge instructions.   

## 2016-12-17 NOTE — Consult Note (Signed)
Consult note.   [name] [name] GNF:[id number] DOB: 08/09/1950 DOA: 12/16/2016  PCP: Sharilyn Sites, MD  Patient coming from: Home.    Chief Complaint:  SOB.   HPI: [name] [name] is an 66 y.o. female with hx of AS, s/p bioprosthetic valve, hx of RCC, sp nephrectomy, chronic diastolic CHF, CAD s/p CABG and DES, DM, Hodgkin's disease s/p mantle Rx, NASH, PAF on DUAT due to hx of GI Bleed, presented to the ER with SOB.  She took un extra dose of Demadex, and presented to the ER at the recommedation of Dr Radford Pax.  In the ER, she diuresed unquantifiable amount, and felt better.  Her oxygen Sat was 96 percent, did not drop with ambulation.  She is now able to complete her sentences, and her vital signs were stable.  Her EKG showed NSR with no acute ST T changes, and her QTc was at 527 msec.  A CXR showed right pleural effusion, and a CT scan of her chest confirmed right pleural effusion, and also raised possibility of leftsided infiltrate.  She has no fever, productive coughs, leukocytosis, or any chest pain.  Her troponin was negative and her BNP was slightly elevated at 180.  Hospitalist was asked to admit her further diuresis.    ED Course:  See above.  Rewiew of Systems:  Constitutional: Negative for malaise, fever and chills. No significant weight loss or weight gain Eyes: Negative for eye pain, redness and discharge, diplopia, visual changes, or flashes of light. ENMT: Negative for ear pain, hoarseness, nasal congestion, sinus pressure and sore throat. No headaches; tinnitus, drooling, or problem swallowing. Cardiovascular: Negative for chest pain, palpitations, diaphoresis, dyspnea and peripheral edema. ; No orthopnea, PND Respiratory: Negative for cough, hemoptysis, wheezing and stridor. No pleuritic chestpain. Gastrointestinal: Negative for diarrhea, constipation,  melena, blood in stool, hematemesis, jaundice and rectal bleeding.    Genitourinary: Negative for frequency, dysuria,  incontinence,flank pain and hematuria; Musculoskeletal: Negative for back pain and neck pain. Negative for swelling and trauma.;  Skin: . Negative for pruritus, rash, abrasions, bruising and skin lesion.; ulcerations Neuro: Negative for headache, lightheadedness and neck stiffness. Negative for weakness, altered level of consciousness , altered mental status, extremity weakness, burning feet, involuntary movement, seizure and syncope.  Psych: negative for anxiety, depression, insomnia, tearfulness, panic attacks, hallucinations, paranoia, suicidal or homicidal ideation   Past Medical History:  Diagnosis Date  . Anxiety   . Aortic stenosis    a. bicuspid aortic valve; mean gradient of 20 mmHg in 2/10; b. s/p bioprosth AVR (19-mm Edwards pericardial valve) with a redo coronary artery bypass graft procedure in 07/2009; postoperative Dressler's syndrome    . Arthritis   . Carcinoma, renal cell ([place]) 05/2007   Laparoscopic right nephrectomy  . Chronic diastolic CHF (congestive heart failure) ([place])    a. 9.2014 EchoP EF 60-65%, no rwma, bioprosth AVR, mean gradient of 83mHg, mild to mod MR, PASP 568mg.  . CKD (chronic kidney disease), stage II   . Colitis, ischemic ([facility]2008  . Coronary artery disease    a. initial CABG with SVG-RCA in 12/05. b. redo SVG-RCA and bioprosthetic AVR in 1/11. d. 9/14 - PCI with DES to distal LM/proximal LAD was done rather than bypass as she would have been a poor candidate for redo sternotomy. d. S/p DES to prox LM 05/2014.  . Diabetes mellitus 03/2010    TYPE II: Hemoglobin A1c of 7.4 in  03/2010; 8.4 in 06/2010; treated with insulin pump  . Duodenal  ulcer    Remote; H. pylori positive  . Gastroparesis due to DM ([place]) 05/01/2012  . Hodgkin's disease ([place]) 1991   M[place]le radiation therapy  . Hyperlipidemia   . Hyperplastic gastric polyp 06/23/2012  . Hypertension   . Iron deficiency anemia    a. 03/2013 EGD: essentially normal. Possible slow GIB.  . Migraines     . NASH (nonalcoholic steatohepatitis) 1999   -biopsy in 1999  . Osteopenia     hip on DEXA in October 2007.  . Osteoporosis   . PAF (paroxysmal atrial fibrillation) ([place])    a.  h/o brief episodes noted on ppm interrogation. b. given GI bleeding and need to be on both ASA 81 and Brilinta, risks likely outweigh benefits of anticoagulation.   . Small bowel obstruction (HCC)    Recurrent; resolved after resection of a lipoma  . Syncope    a. H/o recurrent syncope with pauses on loop recorder s/p Medtronic pacemaker 07/2012.    Past Surgical History:  Procedure Laterality Date  . AORTIC VALVE REPLACEMENT  2011   Aortic valve replacement surgery with a 19 mm bioprosthetic device post redo CABG January 2011.  Marland Kitchen BONE MARROW BIOPSY    . BREAST EXCISIONAL BIOPSY     benign, bilateral  . CERVICAL BIOPSY    . COLONOSCOPY     Multiple with adenomatous polyps  . COLONOSCOPY  06/23/2012   Procedure: COLONOSCOPY;  Surgeon: Gatha Mayer, MD;  Location: [facility] ENDOSCOPY;  Service: Endoscopy;  Laterality: N/A;  . CORONARY ARTERY BYPASS GRAFT  2005, 2011   CABG-most recent SVG to RCA-12/05; RCA occluded with patent graft in 2006; Redo bypass in 07/2009  . ESOPHAGOGASTRODUODENOSCOPY    . ESOPHAGOGASTRODUODENOSCOPY  06/23/2012   Procedure: ESOPHAGOGASTRODUODENOSCOPY (EGD);  Surgeon: Gatha Mayer, MD;  Location: [facility];  Service: Endoscopy;  Laterality: N/A;  . ESOPHAGOGASTRODUODENOSCOPY N/A 04/06/2013   Procedure: ESOPHAGOGASTRODUODENOSCOPY (EGD);  Surgeon: Irene Shipper, MD;  Location: [facility] ENDOSCOPY;  Service: Endoscopy;  Laterality: N/A;  . EXPLORATORY LAPAROTOMY W/ BOWEL RESECTION     Small bowel resection of lipoma  . GIVENS CAPSULE STUDY N/A 04/30/2013   Procedure: GIVENS CAPSULE STUDY;  Surgeon: Gatha Mayer, MD;  Location: [place];  Service: Endoscopy;  Laterality: N/A;  . LEFT AND RIGHT HEART CATHETERIZATION WITH CORONARY ANGIOGRAM N/A 04/07/2013   Procedure: LEFT AND RIGHT HEART  CATHETERIZATION WITH CORONARY ANGIOGRAM;  Surgeon: Larey Dresser, MD;  Location: [facility] CATH LAB;  Service: Cardiovascular;  Laterality: N/A;  . LEFT ATRIAL APPENDAGE OCCLUSION N/A 02/16/2016   Watchman not placed - nickel allergy  . LEFT HEART CATHETERIZATION WITH CORONARY/GRAFT ANGIOGRAM N/A 06/07/2014   Procedure: LEFT HEART CATHETERIZATION WITH Beatrix Fetters;  Surgeon: Burnell Blanks, MD;  Location: [facility] CATH LAB;  Service: Cardiovascular;  Laterality: N/A;  . LIVER BIOPSY    . LOOP RECORDER IMPLANT N/A 12/10/2011   Procedure: LOOP RECORDER IMPLANT;  Surgeon: Evans Lance, MD;  Location: [facility] CATH LAB;  Service: Cardiovascular;  Laterality: N/A;  . MALONEY DILATION  06/23/2012   Procedure: [name] DILATION;  Surgeon: Gatha Mayer, MD;  Location: [facility] ENDOSCOPY;  Service: Endoscopy;;  54 fr  . MASTECTOMY Left 05/17/2015   total   . NEPHRECTOMY  2008   Laparoscopic right nephrectomy, renal cell carcinoma  . PACEMAKER INSERTION  08/21/2012   Medtronic Adapta L dual-chamber pacemaker  . PERCUTANEOUS CORONARY STENT INTERVENTION (PCI-S) N/A 04/09/2013   Procedure: PERCUTANEOUS CORONARY STENT INTERVENTION (PCI-S);  Surgeon: Harrell Gave  Santina Evans, MD;  Location: [facility] CATH LAB;  Service: Cardiovascular;  Laterality: N/A;  . PERMANENT PACEMAKER INSERTION N/A 08/21/2012   Procedure: PERMANENT PACEMAKER INSERTION;  Surgeon: Evans Lance, MD;  Location: [facility] CATH LAB;  Service: Cardiovascular;  Laterality: N/A;  . TEE WITHOUT CARDIOVERSION N/A 12/07/2015   Procedure: TRANSESOPHAGEAL ECHOCARDIOGRAM (TEE);  Surgeon: Larey Dresser, MD;  Location: [place];  Service: Cardiovascular;  Laterality: N/A;  . TOTAL ABDOMINAL HYSTERECTOMY W/ BILATERAL SALPINGOOPHORECTOMY  2000  . TOTAL MASTECTOMY Left 05/17/2015   Procedure: LEFT TOTAL MASTECTOMY;  Surgeon: Rolm Bookbinder, MD;  Location: [place];  Service: General;  Laterality: Left;  . TUMOR EXCISION  1981   removal of Hodgkins lymphoma      reports that she has never smoked. She has never used smokeless tobacco. She reports that she does not drink alcohol or use drugs.  Allergies  Allergen Reactions  . Avandia [Rosiglitazone Maleate] Other (See Comments)    Congestive heart failure  . Cephalexin Anaphylaxis, Swelling and Rash    Extremities swell , throat swelling   . Clindamycin Hcl Anaphylaxis and Shortness Of Breath  . Lincomycin Anaphylaxis and Shortness Of Breath  . Metformin Other (See Comments)    ? Congestive heart failure ?  Marland Kitchen Novolog [Insulin Aspart] Hives and Itching    Humalog & Novolog big knots and whelp    . Penicillins Anaphylaxis, Hives, Swelling and Rash    Has patient had a PCN reaction causing immediate rash, facial/tongue/throat swelling, SOB or lightheadedness with hypotension: Yes Has patient had a PCN reaction causing severe rash involving mucus membranes or skin necrosis: No Has patient had a PCN reaction that required hospitalization No Has patient had a PCN reaction occurring within the last 10 years: No If all of the above answers are "NO", then may proceed with Cephalosporin use.   . Tizanidine Other (See Comments)    Dizziness, Mental Status Changes, Hallucination    . Adhesive [Tape] Other (See Comments)    Tears skin, Please use "paper" tape  . Atorvastatin Other (See Comments)    increased LFT's, no muscle weakness, leg pain Muscle and joint pain increased LFT's   . Cholestyramine Other (See Comments)    Liver Disorder Elevated LFTs   . Crestor [Rosuvastatin] Nausea And Vomiting, Nausea Only and Other (See Comments)    Muscle and joint pain & increased LFTs; tolerates Pravastatin 62m (max)  . Gentamycin [Gentamicin] Hives, Itching and Rash    Fine red bumps.  Reaction noted post loop recorder implant, where gentamycin was used for irrigation. Topical prep  . Humalog [Insulin Lispro] Hives, Itching and Other (See Comments)    Pt uses Apidra   . Iodinated Diagnostic Agents  Other (See Comments)    PAIN DURING LYMPHANGIOGRAM '81, NO ALLERGY TO IV CONTRAST. PAIN DURING LYMPHANGIOGRAM '86, NO ALLERGY TO IV CONTRAST  . Limonene Hives, Itching, Rash and Other (See Comments)    Reaction noted post loop recorder implant, where gentamycin was used for irrigation. Topical prep  . Pravastatin Other (See Comments)    In high doses (20-442m causes muscle and joint pain   . Prednisone Itching and Other (See Comments)    B/P went high, itching all over   . Simvastatin Other (See Comments)    Increased LFT's  . Strawberry Extract Hives, Itching, Rash and Other (See Comments)    Reaction noted post loop recorder implant, where gentamycin was used for irrigation. Topical prep  . Plavix [Clopidogrel Bisulfate] Other (  See Comments)    P2Y12 testing = 271 while on Plavix  . Codeine Nausea And Vomiting  . Erythromycin Nausea And Vomiting  . Erythromycin Base Nausea And Vomiting  . Hydrocodone-Acetaminophen Itching, Rash and Other (See Comments)    itching  . Latex Itching and Other (See Comments)    I"if wear gloves hands get red ,itchy no prioblem if other wear and touch me"  . Neomycin Rash and Other (See Comments)    Redness  . Nickel Itching  . Ranitidine Nausea Only  . Tamiflu [Oseltamivir] Nausea And Vomiting  . Tramadol Nausea Only    Family History  Problem Relation Age of Onset  . Heart attack Mother   . Heart disease Mother   . Heart attack Father   . Diabetes Father   . Heart disease Father   . Lung cancer Sister        meta  . Breast cancer Sister        half sister (Paternal)  . Ovarian cancer Sister   . Cervical cancer Sister        half sister (maternal)  . Hypertension Brother   . Glaucoma Brother   . Colon cancer Neg Hx   . Stomach cancer Neg Hx      Prior to Admission medications   Medication Sig Start Date End Date Taking? Authorizing Provider  Alirocumab (PRALUENT [place]co) Inject 150 mLs into the skin every 14 (fourteen) days.   Yes  [provider]  ALPRAZolam Duanne Moron) 0.5 MG tablet Take 0.5 mg by mouth at bedtime. May take additional dose daily for anxiety   Yes [provider]  amLODipine (NORVASC) 5 MG tablet Take 1 tablet (5 mg total) by mouth daily. 05/14/16  Yes Larey Dresser, MD  APIDRA 100 UNIT/ML injection Inject 46 Units into the skin as directed. INFUSE THROUGH INSULIN PUMP UTD::[place] 46.15 units daily, Bolus depends on carb intake 02/04/15  Yes [provider]  aspirin EC 81 MG tablet Take 81 mg by mouth at bedtime.   Yes [provider]  azithromycin (ZITHROMAX) 500 MG tablet 1 tablet by mouth one hour prior to dental appointment/procedure 12/21/13  Yes Larey Dresser, MD  BRILINTA 60 MG TABS tablet TAKE 1 TABLET(60 MG) BY MOUTH TWICE DAILY 06/28/16  Yes Larey Dresser, MD  Calcium Carbonate-Vitamin D (RA CALCIUM PLUS VITAMIN D) 600-400 MG-UNIT per tablet Take 1 tablet by mouth daily.    Yes [provider]  Cholecalciferol 2000 UNITS TABS Take 1 tablet by mouth every morning.    Yes [provider]  clobetasol (TEMOVATE) 0.05 % external solution Apply 1 application topically daily as needed (for hives). Reported on 12/05/2015   Yes [provider]  Coenzyme Q10 (CO Q 10 PO) Take 1 tablet by mouth at bedtime.   Yes [provider]  colchicine 0.6 MG tablet Take 0.6 mg by mouth at bedtime. And,Take 1 tablet at onset and repeate in 1 hour and then 1 daily x 2 (FOR FLARES)    Yes [provider]  Fe Fum-FA-B Cmp-C-Zn-Mg-Mn-Cu (HEMATINIC PLUS COMPLEX) 106-1 MG TABS Take 1 tablet oraly three times a week or as directed daily with with a Vitamin C tablet Patient taking differently: Take 1 tablet by mouth every Monday, Wednesday, and Friday. Take 1 tablet oraly three times a week or as directed daily with with a Vitamin C tablet 07/26/16  Yes Livesay, Lennis P, MD  glucagon (GLUCAGON EMERGENCY) 1 MG injection Inject  1 mg into the vein once as  needed (for severe reaction). Reported on 12/05/2015   Yes [provider]  isosorbide mononitrate (IMDUR) 60 MG 24 hr tablet TAKE 1 TABLET BY MOUTH ONCE DAILY Patient taking differently: TAKE 1 TABLET BY MOUTH ONCE DAILY AT BEDTIME 07/24/16  Yes Larey Dresser, MD  latanoprost (XALATAN) 0.005 % ophthalmic solution Place 1 drop into both eyes at bedtime.   Yes [provider]  Magnesium Oxide 250 MG TABS Take 250 mg by mouth at bedtime.    Yes [provider]  metoprolol (LOPRESSOR) 50 MG tablet TAKE 1 AND 1/2 TABLETS(75 MG) BY MOUTH TWICE DAILY 02/23/16  Yes Evans Lance, MD  Multiple Vitamins-Minerals (MULTIVITAMIN WITH MINERALS) tablet Take 1 tablet by mouth every morning.    Yes [provider]  nitroGLYCERIN (NITROLINGUAL) 0.4 MG/SPRAY spray Place 1 spray under the tongue every 5 (five) minutes as needed for chest pain. 05/14/16  Yes Larey Dresser, MD  Omega-3 Fatty Acids (FISH OIL) 1000 MG CAPS Take 1,000 mg by mouth at bedtime.    Yes [provider]  omeprazole (PRILOSEC) 20 MG capsule Take 20 mg by mouth every other day. TAKES IN THE EVENING   Yes [provider]  potassium chloride (K-DUR,KLOR-CON) 10 MEQ tablet Take 6 tablets (60 mEq total) by mouth 2 (two) times daily. 10/22/16  Yes Larey Dresser, MD  pravastatin (PRAVACHOL) 10 MG tablet TAKE 1 TABLET BY MOUTH ONCE DAILY Patient taking differently: TAKE 1 TABLET BY MOUTH ONCE DAILY AT BEDTIME 03/29/16  Yes Larey Dresser, MD  torsemide (DEMADEX) 20 MG tablet Take 2 tablets (40 mg total) by mouth 2 (two) times daily. 08/24/16  Yes Larey Dresser, MD  ULORIC 40 MG tablet Take 40 mg by mouth at bedtime.  03/15/16  Yes [provider]  triamcinolone cream (KENALOG) 0.5 % Apply 1 application topically 3 (three) times daily.    [provider]    Physical Exam: Vitals:   12/16/16 2206 12/16/16 2207 12/17/16 0000 12/17/16 0200  BP: (!) 149/64  (!) 136/47 (!) 119/52    Pulse: 95  100 98  Resp: (!) 22  16 (!) 23  Temp: 98.1 F (36.7 C)     TempSrc: Oral     SpO2: 96%  98% 93%  Weight:  54.7 kg (120 lb 8 oz)    Height:  5' 2"  (1.575 m)        Constitutional: NAD, calm, comfortable Vitals:   12/16/16 2206 12/16/16 2207 12/17/16 0000 12/17/16 0200  BP: (!) 149/64  (!) 136/47 (!) 119/52  Pulse: 95  100 98  Resp: (!) 22  16 (!) 23  Temp: 98.1 F (36.7 C)     TempSrc: Oral     SpO2: 96%  98% 93%  Weight:  54.7 kg (120 lb 8 oz)    Height:  5' 2"  (1.575 m)     Eyes: PERRL, lids and conjunctivae normal ENMT: Mucous membranes are moist. Posterior pharynx clear of any exudate or lesions.Normal dentition.  Neck: normal, supple, no masses, no thyromegaly Respiratory: clear to auscultation bilaterally, no wheezing, no crackles. Normal respiratory effort. No accessory muscle use.  Cardiovascular: Regular rate and rhythm, no murmurs / rubs / gallops. No extremity edema. 2+ pedal pulses. No carotid bruits.  Abdomen: no tenderness, no masses palpated. No hepatosplenomegaly. Bowel sounds positive.  Musculoskeletal: no clubbing / cyanosis. No joint deformity upper and lower extremities. Good ROM, no contractures.  Normal muscle tone.  Skin: no rashes, lesions, ulcers. No induration Neurologic: CN 2-12 grossly intact. Sensation intact, DTR normal. Strength 5/5 in all 4.  Psychiatric: Normal judgment and insight. Alert and oriented x 3. Normal mood.   Labs on Admission: I have personally reviewed following labs and imaging studies CBC:  Recent Labs Lab 12/13/16 1427 12/17/16 0010  WBC 8.0 10.3  NEUTROABS 5.8 7.4  HGB 13.4 14.0  HCT 40.3 41.7  MCV 88.8 89.9  PLT 107* 98*   Basic Metabolic Panel:  Recent Labs Lab 12/13/16 1427 12/17/16 0010  NA 142 143  K 4.4 4.3  CL  --  102  CO2 28 29  GLUCOSE 107 119*  BUN 33.3* 35*  CREATININE 1.3* 1.11*  CALCIUM 10.3 10.1   GFR: Estimated Creatinine Clearance: 41 mL/min (A) (by C-G formula based on  SCr of 1.11 mg/dL (H)). Liver Function Tests:  Recent Labs Lab 12/13/16 1427  AST 54*  ALT 62*  ALKPHOS 207*  BILITOT 0.74  PROT 7.9  ALBUMIN 4.4   Cardiac Enzymes:  Recent Labs Lab 12/17/16 0010  TROPONINI <0.03   BNP (last 3 results) CBG:  Recent Labs Lab 12/16/16 2345 12/17/16 0042  GLUCAP 57* 130*   Urine analysis:    Component Value Date/Time   COLORURINE STRAW (A) 12/16/2016 2356   APPEARANCEUR CLEAR 12/16/2016 2356   LABSPEC 1.005 12/16/2016 2356   LABSPEC 1.010 10/17/2015 0808   PHURINE 7.0 12/16/2016 2356   GLUCOSEU 50 (A) 12/16/2016 2356   GLUCOSEU 500 10/17/2015 0808   HGBUR NEGATIVE 12/16/2016 2356   BILIRUBINUR NEGATIVE 12/16/2016 2356   BILIRUBINUR Negative 10/17/2015 0808   KETONESUR NEGATIVE 12/16/2016 2356   PROTEINUR NEGATIVE 12/16/2016 2356   UROBILINOGEN 0.2 10/17/2015 0808   NITRITE NEGATIVE 12/16/2016 2356   LEUKOCYTESUR NEGATIVE 12/16/2016 2356   LEUKOCYTESUR Negative 10/17/2015 0808    Radiological Exams on Admission: Dg Chest 2 View  Result Date: 12/17/2016 CLINICAL DATA:  66 year old female with shortness of breath. History of CHF. EXAM: CHEST  2 VIEW COMPARISON:  Chest radiograph dated 05/29/2015 FINDINGS: There is a small to moderate right pleural effusion with associated compressive atelectasis of the right lung base and right middle lobe. Superimposed pneumonia is not excluded. The left lung is clear. There is no pneumothorax. The cardiac silhouette is within normal limits. Mechanical mitral valve and left pectoral dual lead pacemaker device noted. There is coronary stent. There is atherosclerotic calcification of the aortic arch. Osteopenia with degenerative changes of the spine. No acute osseous pathology. IMPRESSION: Right-sided pleural effusion with compressive atelectasis versus infiltrate in the right middle lobe and right lung base. Clinical correlation and follow-up recommended. Electronically Signed   By: Anner Crete  M.D.   On: 12/17/2016 01:10   Ct Chest Wo Contrast  Result Date: 12/17/2016 CLINICAL DATA:  Acute onset of shortness of breath. Abnormal chest radiograph. Initial encounter. EXAM: CT CHEST WITHOUT CONTRAST TECHNIQUE: Multidetector CT imaging of the chest was performed following the standard protocol without IV contrast. COMPARISON:  Chest radiograph performed earlier today at 12:36 a.m., and CT of the chest performed 07/25/2010 FINDINGS: Cardiovascular: The heart is normal in size. Diffuse calcification is noted at the aortic and mitral valves, and diffuse coronary artery calcifications are seen. Diffuse calcification is seen along the thoracic aorta. An aortic valve replacement is noted. The patient is status post median sternotomy. Pacemaker leads are noted ending at the right atrium and right ventricle. The great vessels are grossly unremarkable in  appearance, though not well assessed without contrast. Mediastinum/Nodes: The mediastinum is otherwise unremarkable in appearance. Visualized mediastinal nodes are borderline normal in size. No pericardial effusion is identified. Scattered small hypodensities within the right thyroid lobe are likely benign, given their size. No axillary lymphadenopathy is appreciated. Lungs/Pleura: A small mildly loculated right-sided pleural effusion is noted, extending along the right major fissure. There is an associated thin high-density pleural rind, which appears grossly stable from 2012 and likely reflects the patient's baseline. Mild underlying atelectasis is seen. No underlying mass is identified. Mild hazy airspace opacity is noted at the left upper and lower lobes, raising concern for underlying pneumonia. No pneumothorax is seen. Upper Abdomen: The visualized portions of the liver and spleen are unremarkable. The visualized portions of the gallbladder, pancreas, adrenal glands and left kidney are within normal limits. Mild left-sided perinephric stranding is noted.  Musculoskeletal: No acute osseous abnormalities are identified. The visualized musculature is unremarkable in appearance. The patient is status post left-sided mastectomy. IMPRESSION: 1. Small mildly loculated right-sided pleural effusion, extending along the right major fissure. Associated high-density pleural rind appears grossly stable from 2012 and likely reflect the patient's underlying baseline. No underlying mass is seen. However, depending on the patient's symptoms, diagnostic thoracentesis could be considered for further evaluation. 2. Mild hazy airspace opacity at the left upper and lower lung lobes, raising concern for underlying pneumonia. 3. Diffuse calcification along the thoracic aorta, and at the aortic and mitral valves. Diffuse coronary artery calcifications seen. Electronically Signed   By: Garald Balding M.D.   On: 12/17/2016 01:52    EKG: Independently reviewed  Assessment/Plan Active Problems:   Hyperlipidemia   Aortic stenosis status post 19 mm Edwards pericardial valve replacement 2011   Renal cell carcinoma (HCC)   Hodgkin's disease ([place])   Pacemaker   CAD (coronary artery disease) of artery bypass graft   PAF (paroxysmal atrial fibrillation) (HCC)   Type 1 diabetes mellitus (HCC)    PLAN:   Diastolic CHF:  She is diuresing well, and her sat is 96 percent, and since she is feeling better with IV lasix given, I think its reasonable to resume her oral diuretics and follow up with her PCP and her cardiologist.   Abnormal CXR and Chest CT:  I am not convinced she has PNA, but given her CT finding, it is reasonable to Tx her for CAP, I would use Doxy instead of IV Levoquin, given her QTc prolongation.   RIght pleural effusion:  She was informed to have a follow up CXR for the resolution of the infiltrate and the pleural effusion.    These were discussed with her, her husband at bedside, and Dr Wyvonnia Dusky who agreed with assessment.       Poppi Scantling MD FACP. [facility]  If 7PM-7AM, please contact night-coverage [email or web address] Password [email or web address]  12/17/2016, 2:52 AM

## 2016-12-18 ENCOUNTER — Telehealth (HOSPITAL_COMMUNITY): Payer: Self-pay | Admitting: *Deleted

## 2016-12-18 ENCOUNTER — Encounter (HOSPITAL_COMMUNITY): Payer: Medicare Other

## 2016-12-18 MED ORDER — TORSEMIDE 20 MG PO TABS: ORAL_TABLET | ORAL | 3 refills | Status: DC

## 2016-12-18 NOTE — Telephone Encounter (Signed)
Please check on her and see if she went to hospital.  If not, needs to increase torsemide to 60 mg bid and followup with [name] as a workin.

## 2016-12-18 NOTE — Telephone Encounter (Signed)
Patient called in saying she had gone to the ER over the weekend and is being treated for possible pneumonia.  Dr. Aundra Dubin reviewed ER notes and he is advising patient to complete antibiotics and to take Torsemide 60 mg BID for a couple of days then decrease to 60 mg in the AM and 40 mg in the PM on Thursday. He also wants to be see her next week in clinic.  Patient is agreeable with plan and she is scheduled to see Dr. Aundra Dubin next Thursday.  Medication list is updated and no further questions at this time.

## 2016-12-20 ENCOUNTER — Telehealth (HOSPITAL_COMMUNITY): Payer: Self-pay

## 2016-12-20 ENCOUNTER — Encounter: Payer: Medicare Other | Admitting: Psychology

## 2016-12-20 NOTE — Telephone Encounter (Signed)
Patient calling to confirm med changes discussed with Susie RN earlier this week. Says has had some mild leg cramps but has alos lost 6 lbs and is feeling better. Reminded of upcoming apt with Dr. Aundra Dubin and advised to return call to CHF clinic for any further questions/concerns.  Renee Pain, RN

## 2016-12-21 ENCOUNTER — Telehealth (HOSPITAL_COMMUNITY): Payer: Self-pay | Admitting: *Deleted

## 2016-12-21 NOTE — Telephone Encounter (Signed)
Patient called and left message on triage line asking for Dr. Aundra Dubin to prescribe her something for a yeast infection in which she developed from antibiotics.  I called her back and left detailed message explaining that she would need to call her PCP for yeast infection treatment.

## 2016-12-23 ENCOUNTER — Telehealth: Payer: Self-pay | Admitting: Emergency Medicine

## 2016-12-23 NOTE — Telephone Encounter (Signed)
    Patient called at 9: 30am  Patient of Dr. Aundra Dubin in [place]. Next appointment June 8.  She has been taking double her potassium total of 80 mg because she is also taking 2 extra Torsemide a day for excess le fluid and difficulty breathing. She currently feels "better then she has in months" and has no concerns. She needs early refill of potassium.  **Called into [employer]. Advised to call Monday and speak with Dr. Aundra Dubin about her need for increased Torsemide.

## 2016-12-27 ENCOUNTER — Encounter (HOSPITAL_COMMUNITY): Payer: Self-pay

## 2016-12-27 ENCOUNTER — Ambulatory Visit (HOSPITAL_COMMUNITY)
Admission: RE | Admit: 2016-12-27 | Discharge: 2016-12-27 | Disposition: A | Discharge status: Home / Self Care | Payer: Medicare Other | Source: Ambulatory Visit | Attending: Cardiology | Admitting: Cardiology

## 2016-12-27 VITALS — BP 135/64 | HR 71 | Wt 119.5 lb

## 2016-12-27 DIAGNOSIS — D509 Iron deficiency anemia, unspecified: Secondary | ICD-10-CM | POA: Diagnosis not present

## 2016-12-27 DIAGNOSIS — Q231 Congenital insufficiency of aortic valve: Secondary | ICD-10-CM | POA: Diagnosis not present

## 2016-12-27 DIAGNOSIS — I130 Hypertensive heart and chronic kidney disease with heart failure and stage 1 through stage 4 chronic kidney disease, or unspecified chronic kidney disease: Secondary | ICD-10-CM | POA: Diagnosis present

## 2016-12-27 DIAGNOSIS — I25110 Atherosclerotic heart disease of native coronary artery with unstable angina pectoris: Secondary | ICD-10-CM | POA: Diagnosis present

## 2016-12-27 DIAGNOSIS — Z853 Personal history of malignant neoplasm of breast: Secondary | ICD-10-CM | POA: Insufficient documentation

## 2016-12-27 DIAGNOSIS — I5032 Chronic diastolic (congestive) heart failure: Secondary | ICD-10-CM

## 2016-12-27 DIAGNOSIS — Z8571 Personal history of Hodgkin lymphoma: Secondary | ICD-10-CM | POA: Insufficient documentation

## 2016-12-27 DIAGNOSIS — N189 Chronic kidney disease, unspecified: Secondary | ICD-10-CM | POA: Insufficient documentation

## 2016-12-27 DIAGNOSIS — G4733 Obstructive sleep apnea (adult) (pediatric): Secondary | ICD-10-CM

## 2016-12-27 DIAGNOSIS — I2582 Chronic total occlusion of coronary artery: Secondary | ICD-10-CM | POA: Diagnosis not present

## 2016-12-27 DIAGNOSIS — Z8249 Family history of ischemic heart disease and other diseases of the circulatory system: Secondary | ICD-10-CM | POA: Diagnosis not present

## 2016-12-27 DIAGNOSIS — Z8711 Personal history of peptic ulcer disease: Secondary | ICD-10-CM | POA: Diagnosis not present

## 2016-12-27 DIAGNOSIS — E1122 Type 2 diabetes mellitus with diabetic chronic kidney disease: Secondary | ICD-10-CM | POA: Diagnosis not present

## 2016-12-27 DIAGNOSIS — Z7982 Long term (current) use of aspirin: Secondary | ICD-10-CM | POA: Diagnosis not present

## 2016-12-27 DIAGNOSIS — Z951 Presence of aortocoronary bypass graft: Secondary | ICD-10-CM | POA: Diagnosis not present

## 2016-12-27 DIAGNOSIS — E1143 Type 2 diabetes mellitus with diabetic autonomic (poly)neuropathy: Secondary | ICD-10-CM | POA: Diagnosis not present

## 2016-12-27 DIAGNOSIS — I495 Sick sinus syndrome: Secondary | ICD-10-CM | POA: Insufficient documentation

## 2016-12-27 DIAGNOSIS — M109 Gout, unspecified: Secondary | ICD-10-CM | POA: Diagnosis not present

## 2016-12-27 DIAGNOSIS — Z9013 Acquired absence of bilateral breasts and nipples: Secondary | ICD-10-CM | POA: Insufficient documentation

## 2016-12-27 DIAGNOSIS — I5033 Acute on chronic diastolic (congestive) heart failure: Secondary | ICD-10-CM | POA: Insufficient documentation

## 2016-12-27 DIAGNOSIS — A0472 Enterocolitis due to Clostridium difficile, not specified as recurrent: Secondary | ICD-10-CM | POA: Diagnosis not present

## 2016-12-27 DIAGNOSIS — I2510 Atherosclerotic heart disease of native coronary artery without angina pectoris: Secondary | ICD-10-CM | POA: Diagnosis not present

## 2016-12-27 DIAGNOSIS — K56609 Unspecified intestinal obstruction, unspecified as to partial versus complete obstruction: Secondary | ICD-10-CM | POA: Insufficient documentation

## 2016-12-27 DIAGNOSIS — I480 Paroxysmal atrial fibrillation: Secondary | ICD-10-CM | POA: Diagnosis not present

## 2016-12-27 DIAGNOSIS — E785 Hyperlipidemia, unspecified: Secondary | ICD-10-CM | POA: Insufficient documentation

## 2016-12-27 LAB — BASIC METABOLIC PANEL
Anion gap: 8 (ref 5–15)
BUN: 39 mg/dL — AB (ref 6–20)
CO2: 29 mmol/L (ref 22–32)
CREATININE: 1.05 mg/dL — AB (ref 0.44–1.00)
Calcium: 9.9 mg/dL (ref 8.9–10.3)
Chloride: 101 mmol/L (ref 101–111)
GFR calc Af Amer: >60 mL/min (ref >60)
GFR, EST NON AFRICAN AMERICAN: 55 mL/min — AB (ref >60)
GLUCOSE: 175 mg/dL — AB (ref 65–99)
Potassium: 4 mmol/L (ref 3.5–5.1)
SODIUM: 138 mmol/L (ref 135–145)

## 2016-12-27 LAB — BRAIN NATRIURETIC PEPTIDE: B NATRIURETIC PEPTIDE 5: 102.8 pg/mL — AB (ref 0.0–100.0)

## 2016-12-27 NOTE — Patient Instructions (Addendum)
Labs today  Keep follow up appointment as scheduled 03/05/17 with [name]

## 2016-12-27 NOTE — Progress Notes (Signed)
Patient ID: [name] [name], female   DOB: 03/14/1951, 66 y.o.   MRN: [id number] PCP: Dr. Hilma Favors Cardiology: Dr. Aundra Dubin  66 yo with history of CAD s/p CABG in 12/05 and redo in 1/11, bioprosthetic AVR, diastolic CHF, and sick sinus syndrome s/p PCM presents for cardiology followup.  She had initial CABG with SVG-RCA in 12/05 followed by redo SVG-RCA and bioprosthetic AVR in 1/11.  She has had diastolic CHF and is on Lasix.  In 9/14, she had a CPX test.  This showed severe functional limitation with ischemic ECG changes (inferolateral ST depression and ST elevation in V1 and V2). She had chest pain.  She had R/LHC in 9/14.  This showed elevated left and right heart filling pressures, patent SVG-PDA, and 80% distal LM/80% ostial LAD stenosis.  She was not a candidate for redo CABG (had 2 prior sternotomies as well as chest radiation for Hodgkins lymphoma).  She had DES from left main into proximal LAD and will need long-term DAPT => Brilinta was used as she has been a poor responder to Plavix in the past.  She re-developed angina in 11/15 and was admitted with unstable angina.  LHC showed 80% ostial LM and patent SVG-RCA. She had DES to ostial LM. Last echo in 1/18 showed EF 60-65%, normal bioprosthetic aortic valve, mild-moderate MR, PASP 35 mmHg.   She has chronic iron deficiency anemia thought to be due to a slow GI bleed.  Last EGD was unremarkable and a capsule endoscopy was also unremarkable.  She has had periodic transfusions; However, recently hemoglobin has been stable.    She had bilateral mastectomy for DCIS in 16/38 without complication.  She has an insulin pump followed by an endocrinologist at [facility].   Given myalgias with statins, she was started on Praluent.  LDL is now excellent.    In the past she has had short runs of atrial fibrillation.  She is not anticoagulated.  Planned for Watchman placement, but given her nickel allergy, we were unable to place a Watchman.  Today, she is in NSR.    Cardiolite in 2/18 showed no ischemia or infarction.     Diagnosed with OSA, to have CPAP titration 6/26.   Over Memorial Day weekend, had a family picnic and took in more sodium than is usual for her.  She developed dyspnea, orthopnea, and a cough.  She was seen in the ER on 5/28 with suspected diastolic CHF exacerbation and possible PNA (CT chest concerning for PNA).  She was given IV Lasix and doxycycline. We had her increase her torsemide to 60 qam/40 qpm.  Since then, she is feeling better.  Weight is down 2 lbs compared to last appointment.  She is short of breath walking up hills and stairs but no dyspnea on flat ground.  No orthopnea.  No chest pain.    Labs (7/14): hemoglobin 11 Labs (9/14): K 3.9, creatinine 1.48, hemoglobin 7.3 Labs (10/14): hemoglobin 7.7 Labs (11/14): K 3.8, creatinine 1.5 Labs (12/14): HCT 37.7 Labs (1/15): LDL 106, HDL 41, K 3.9, creatinine 1.6, LFTs normal Labs (2/15): K 3.7, creatinine 1.1, BNP 71 Labs (3/15): HCT 36.7 Labs (4/15): K 3.7, creatinine 1.1, HCT 35.5 Labs (7/15): HCT 36.4 Labs (11/15): HCT 32.1, K 4.2, creatinine 1.06 Labs (12/15): hgb 12 Labs (1/16): HCT 37.1 Labs (3/16): LDL 25, HDL 42, elevated transaminases Labs (6/16): HCT 39.6, plts 146 Labs (7/16): LDL 59, HDL 41, AST 34, ALT 49, AP 125 Labs (11/16): HCT 38.8, alkaline phosphatase  156, AST/ALT normal, K 4, creatinine 1.0 Labs (3/17): K 4.4, creatinine 1.4, LFTs normal, hgb 12.5 Labs (5/17): HCT 41.8, K 4, creatinine 1.3 Labs (9/17): K 4, creatinine 0.43, hgb 13.6 Labs (10/17): LDL 42, HDL 43 Labs (1/18): K 4.3, creatinine 1.1 => 1.19, AST 66, ALT 67, hgb 14.2, BNP 72 Labs (2/18): K 3.9, creatinine 1.2, BNP 49.6, HCT 42.1 Labs (4/18): hgb 13.8 Labs (5/18): HCT 41.7, plts 98, K 4.3, creatinine 1.11, BNP 180  PMH: 1. Sick sinus syndrome s/p [employer] PCM.  2. HTN 3. H/o SBO 4. Renal cell carcinoma s/p right nephrectomy in 2008.  5. 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   Fibroscan in 11/14 showed advanced fibrosis concerning for cirrhosis. EGD in 11/14 showed no varices.  6. Diastolic CHF: Echo (9/37) with EF 60-65%, bioprosthetic aortic valve with mean gradient 15 mmHg, mild MR.  CPX (9/14): peak VO2 10.4, VE/VCO2 43.8, inferolateral ST depression and ST elevation in V1/V2 with severe dyspnea and chest pain, severe functional limitation with ischemic changes.  Echo (9/14) with EF 60-65%, normal RV size and systolic function, mild-moderate MR, bioprosthetic aortic valve normal, PA systolic pressure 51 mmHg, no evidence for pericardial constriction though exam incomplete for constriction.  Echo (11/15) with EF 55-60%, bioprosthetic aortic valve, mild-moderate MR, moderate TR, PA systolic pressure 42 mmHg.  - TEE (5/17) with EF 60-65%, normal bioprosthetic aortic valve with mean gradient 13 mmHg, normal RV size and systolic function. - Echo (1/18) with EF 60-65%, normal bioprosthetic aortic valve, mild-moderate MR, PASP 35 mmHg.  7. TAH-BSO 8. Type II diabetes: Has insulin pump.  9. H/o ischemic colitis. 10. H/o PUD. 11. Diabetic gastroparesis. 12. Hyperlipidemia: Myalgias with Crestor and atorvastatin, elevated LFTs with simvastatin.  Myalgias with > 10 mg pravastatin. Now on Praluent.  13. CKD 14. Bicuspid aortic valve s/p 19 mm Edwards pericardial valve in 1/11 (had post-operative Dresslers syndrome).   15. CAD: CABG with SVG-RCA in 12/05.  Redo SVG-RCA in 1/11 with AVR.  LHC/RHC (9/14) with mean RA 18, PA 62/25 mean 43, mean PCWP 26, CI 2.8, 70-80% distal LM stenosis, 80% ostial LAD stenosis, total occlusion RCA, SVG-PDA patent.  Patient had DES LM into LAD.  Plan to continue long-term ASA/Brilinta (poor responder to Plavix).  ETT-cardiolite (4/15) with 6' exercise, EF 60%, ST depression in recovery and mild chest pain, no ischemia or infarction by perfusion images.  Unstable angina 11/15 with 80% LM, patent SVG-RCA => DES to ostial LM.   - Lexiscan Cardiolite (2/18):  EF 62%, no ischemia or infarction (normal).  She had a significant reaction to Dra[place] and probably should not get again.  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  17. Anemia: Iron-deficiency.  Suspect chronic GI blood loss.  EGD in 9/14 was unremarkable.  Capsule endoscopy 10/14 was unremarkable.  Has required periodic blood transfusions.  18. Atrial fibrillation: Paroxysmal, noted by [facility] interrogation.  19. Carotid stenosis: Carotid dopplers (2/15) with 40-59% bilateral stenosis. Carotid dopplers (3/16) with 40-59% bilateral ICA stenosis. Carotid dopplers (3/17) with 40-59% bilateral ICA stenosis.  - Carotid dopplers (3/18) with mild BICA stenosis.  20. 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  22. C difficile colitis 2017 23. Gout 24. ?TIA: head CT negative.  25. OSA: Moderate, needs CPAP titration.  26. Chronic thrombocytopenia  SH: Married, lives in [place], nonsmoker.   FH: CAD  ROS:  All systems reviewed and negative except as per HPI.   Current Outpatient Prescriptions  Medication Sig Dispense Refill  . Alirocumab (PRALUENT K[place]aid) Inject 150 mLs into the skin every 14 (fourteen) days.    . ALPRAZolam (XANAX) 0.5 MG tablet Take 0.5 mg by mouth at bedtime. May take additional dose daily for anxiety    . amLODipine (NORVASC) 5 MG tablet Take 1 tablet (5 mg total) by mouth daily. 90 tablet 3  . APIDRA 100 UNIT/ML injection Inject 46 Units into the skin as directed. INFUSE THROUGH INSULIN PUMP UTD::[place] 46.15 units daily, Bolus depends on carb intake  2  . aspirin EC 81 MG tablet Take 81 mg by mouth at bedtime.    Marland Kitchen BRILINTA 60 MG TABS tablet TAKE 1 TABLET(60 MG) BY MOUTH TWICE DAILY 180 tablet 2  . Calcium Carbonate-Vitamin D (RA CALCIUM PLUS VITAMIN D) 600-400 MG-UNIT per tablet Take 1 tablet by mouth daily.     .  Cholecalciferol 2000 UNITS TABS Take 1 tablet by mouth every morning.     . clobetasol (TEMOVATE) 0.05 % external solution Apply 1 application topically daily as needed (for hives). Reported on 12/05/2015    . Coenzyme Q10 (CO Q 10 PO) Take 1 tablet by mouth at bedtime.    . colchicine 0.6 MG tablet Take 0.6 mg by mouth at bedtime. And,Take 1 tablet at onset and repeate in 1 hour and then 1 daily x 2 (FOR FLARES)     . doxycycline (VIBRAMYCIN) 100 MG capsule Take 1 capsule (100 mg total) by mouth 2 (two) times daily. 20 capsule 0  . Fe Fum-FA-B Cmp-C-Zn-Mg-Mn-Cu (HEMATINIC PLUS COMPLEX) 106-1 MG TABS Take 1 tablet oraly three times a week or as directed daily with with a Vitamin C tablet (Patient taking differently: Take 1 tablet by mouth every Monday, Wednesday, and Friday. Take 1 tablet oraly three times a week or as directed daily with with a Vitamin C tablet) 60 each 0  . glucagon (GLUCAGON EMERGENCY) 1 MG injection Inject 1 mg into the vein once as needed (for severe reaction). Reported on 12/05/2015    . isosorbide mononitrate (IMDUR) 60 MG 24 hr tablet TAKE 1 TABLET BY MOUTH ONCE DAILY 90 tablet 3  . latanoprost (XALATAN) 0.005 % ophthalmic solution Place 1 drop into both eyes at bedtime.    . Magnesium Oxide 250 MG TABS Take 250 mg by mouth at bedtime.     . metoprolol (LOPRESSOR) 50 MG tablet TAKE 1 AND 1/2 TABLETS(75 MG) BY MOUTH TWICE DAILY 270 tablet 3  . Multiple Vitamins-Minerals (MULTIVITAMIN WITH MINERALS) tablet Take 1 tablet by mouth every morning.     . Omega-3 Fatty Acids (FISH OIL) 1000 MG CAPS Take 1,000 mg by mouth at bedtime.     Marland Kitchen omeprazole (PRILOSEC) 20 MG capsule Take 20 mg by mouth every other day. TAKES IN THE EVENING    . potassium chloride (K-DUR,KLOR-CON) 10 MEQ tablet Take 10 mEq by mouth as directed. Take 6 tablets in the AM and 4 tablets in the PM    . pravastatin (PRAVACHOL) 10 MG tablet TAKE 1 TABLET BY MOUTH ONCE DAILY 90 tablet 3  . torsemide (DEMADEX) 20 MG  tablet Take 60 mg (3 Tablets) in the AM and 40 mg (2 Tablets) in the PM. 120 tablet 3  . triamcinolone cream (KENALOG) 0.5 % Apply 1 application topically 3 (three) times daily.    Marland Kitchen ULORIC 40 MG tablet Take 40 mg by mouth at  bedtime.   11  . azithromycin (ZITHROMAX) 500 MG tablet 1 tablet by mouth one hour prior to dental appointment/procedure (Patient not taking: Reported on 12/27/2016) 2 tablet 0  . nitroGLYCERIN (NITROLINGUAL) 0.4 MG/SPRAY spray Place 1 spray under the tongue every 5 (five) minutes as needed for chest pain. (Patient not taking: Reported on 12/27/2016) 12 g 1   No current facility-administered medications for this encounter.     BP 135/64   Pulse 71   Wt 119 lb 8 oz (54.2 kg)   SpO2 100%   BMI 21.86 kg/m  General: NAD.  Neck: No JVD, no thyromegaly or thyroid nodule.  Lungs: CTAB  CV: Nondisplaced PMI.  Heart regular S1/S2, no S3/S4, 2/6 early systolic murmur RUSB.  No edema.  Left>right carotid bruit.  2+ PT pulses bilaterally.  Abdomen: Soft, nontender, no hepatosplenomegaly, no distention.  Skin: Intact without lesions or rashes.  Neurologic: Alert and oriented x 3.  Psych: Normal affect. Extremities: No clubbing or cyanosis.   Assessment/Plan: 1. CAD: s/p SVG-RCA in 12/05, then redo SVG-RCA in 1/11--> distal left main/proximal LAD severe stenosis in 9/14.  PCI with DES to distal LM/proximal LAD was done rather than bypass as she would have been a poor candidate for redo sternotomy (2 prior sternotomies and history of chest wall radiation for Hodgkins disease).  Recurrent unstable angina with 80% ostial LM treated with DES in 11/15.  No chest pain. Recent Lexiscan Cardiolite in 2/18 showed normal EF, no ischemia/infarction. - Continue Brilinta 60 mg bid (poor responder to plavix) long-term given recurrent left main disease.  This has been complicated in the past by suspected slow GI bleed. Most recent hemoglobin was normal. - Continue ASA 81, statin, metoprolol, Imdur  at the current doses.    2. Bioprosthetic aortic valve replacement: In setting of bicuspid aortic valve, thoracic aorta not dilated on 2017 TEE.  Stable on 1/18 echo.   3. Chronic diastolic CHF: Recent echo unchanged compared to the past.  She has been having more difficulty with volume retention recently.  Seen in ER 5/17 with diastolic CHF exacerbation.  Currently, NYHA class II symptoms and not volume overloaded on exam.  - Continue torsemide at 60 qam/40 qpm and KCl 60 bid.  BMET today.   - Repeat CXR at followup to make sure it has cleared (?PNA).  4. CKD:  Patient has a single kidney s/p right nephrectomy.  She sees nephrology. BMET today.  5. Hyperlipidemia: She has not been able to tolerate any higher potency statin than pravastatin 10 mg daily.   - Continue Praluent, good lipids when last checked.       6. Anemia: Suspected slow GI bleed.  Unremarkable EGD and capsule endoscopy in fall 2014.  Unfortunately, needs to stay on Brilinta long-term (Plavix non-responder).  Will need to follow closely (sees hematology), plan for transfusion with hemoglobin < 8 as this allows best control of her symptoms. Recently, hemoglobin has been more stable.   7. Atrial fibrillation: Paroxysmal, brief episodes noted on PCM interrogation.  Given GI bleeding and need to be on Brilinta, risks have been thought to outweigh benefits of anticoagulation.  She is in NSR today.   - Ongoing brief atrial fibrillation episodes. She is not a good candidate for anticoagulants due to to GI bleed.  She is not a good Multaq candidate with CHF.  If she needs an antiarrhythmic, consider Tikosyn or Sotalol.  - We were unable to place Watchman device due to nickel allergy =>  She wonders if her PPM is titanium-nickel, as if so, she tolerated this without problems.  We are trying to find this out.   8. Carotid bruit: Only mild stenosis on last dopplers in 3/18. 9. SSS: Pacemaker in place.  10. HTN: controlled. Continue current  regimen.   11. OSA: Moderate, awaiting CPAP titration on 6/26.  12. ?TIA: Transient dysarthria in 3/18, could have been afib-related TIA.  No recurrent symptoms.  13. Thrombocytopenia: Chronic but has been worsening.  Needs to continue to follow with heme/onc.    Followup in 2 months   [name] 12/27/2016

## 2016-12-31 ENCOUNTER — Ambulatory Visit (INDEPENDENT_AMBULATORY_CARE_PROVIDER_SITE_OTHER): Payer: Medicare Other | Admitting: Psychology

## 2016-12-31 ENCOUNTER — Encounter: Payer: Self-pay | Admitting: Psychology

## 2016-12-31 DIAGNOSIS — R413 Other amnesia: Secondary | ICD-10-CM

## 2016-12-31 NOTE — Progress Notes (Signed)
   Neuropsychology Note  [name] [name] came in today for 2 hours of neuropsychological testing with technician, Milana Kidney, BS, under the supervision of Dr. Macarthur Critchley. The patient did not appear overtly distressed by the testing session, per behavioral observation or via self-report to the technician. Rest breaks were offered. [name] [name] will return within 2 weeks for a feedback session with Dr. Si Raider at which time her test performances, clinical impressions and treatment recommendations will be reviewed in detail. The patient understands she can contact our office should she require our assistance before this time.  Full report to follow.

## 2016-12-31 NOTE — Progress Notes (Signed)
NEUROPSYCHOLOGICAL INTERVIEW (CPT: [id number])  Name: [name] [name] Date of Birth: Aug 01, 1950 Date of Interview: 12/31/2016  Reason for Referral:  [name] [name] is a 66 y.o. female who is referred for neuropsychological evaluation by Dr. Wells Guiles Tat of [facility] due to concerns about memory changes. This patient is unaccompanied in the office for today's appointment.  History of Presenting Problem:  [name] [name] was in her usual state of health when on 10/08/2016 she had an episode of slurred and "garbled" speech when speaking on the phone (leaving a message for her pharmacy). She checked her blood sugar and it was fine. 10 minutes her husband arrived home and noticed her speech difficulties. Also the pharmacy called back and stated they could not understand her message. The entire episode lasted between 15-30 minutes. She denies any associated lateralizing weakness or paresthesias.  No swallowing difficulties.  No diplopia.  She did feel that she had a dry mouth.   She did feel very tired and decided to rest and upon awakening felt normal.    She never felt like this previously and never since.    The patient also notes that she has had some forgetfulness over the years since retiring and she is unsure if this is normal for age. She retired in 03/2013 due to CHF. She complains of word finding difficulty which is happening much more frequently and is very frustrating to her. She also reports some forgetfulness for recent conversations but recalls with cueing. Her daughter pointed out to her that she is saying "you know" much more frequently during conversation (her daughter once counted her say it 14 times within a span of 1 1/2 minutes). She denies concentration problems. She denies reduced attention/concentration. She denies difficulty with visual-spatial skills or navigation. She manages all complex ADLs, including driving, medications, finances, appointments and cooking, and denies any  difficulties with these.  A head CT without contrast from 11/15/2016 revealed nothing acute, no white matter disease, and normal for age cerebral volume. An ambulatory EEG completed on 11/28/2016 was abnormal due to occasional focal slowing over the left temporal region. Clinical correlation was advised.  Medical history is significant for Hodgkin's disease diagnosed in 65. She had radiation from her "chin to belly button" for this. 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 She appears to have cirrhosis of the liver also likely due to the history of radiation. She has thyroid nodules which are being closely monitored. She had kidney cancer, also presumed to be secondary to radiation; she had one kidney removed. She has had breast cancer, presumably secondary to radiation. She has had heart issues since age 24, also thought to be related to history of radiation. She has CAD and is s/p CABG (2000) and redo/AVR in 0/9735, diastolic CHF, and sick sinus syndrome s/p PCM.  The patient notes that when she had her first heart surgery  in 2000, it took her several months to return to her cognitive baseline. She eventually did. She was told this may have been due to decreased oxygen to the brain during the surgery. She did not have any cognitive changes following her heart surgery in 2011. She has had multiple other procedures including 16 cardiac catheterizations, pacemaker placement, mastectomy, and kidney resection, with no cognitive sequelae.   Physically, the patient complains of shortness of breath and reduced stamina. She has some difficulty staying asleep. She completed a sleep study due to increased fatigue since January of this year. She  was diagnosed with sleep apnea and is going to have CPAP titration at the end of this month. She has taken 0.25 mg Xanax "for many years" for sleep. She stated that her "brain doesn't stop" and she lies awake if she does not take it. She does not  take it any other time.  Psychiatric history is negative. She denies any significant depression or anxiety. She considers herself a positive person. She is frustrated by her inability to complete tasks as quickly and her reduced physical strength.  She is missing cognitive stimulation and challenge from her work. She is staying busy with church responsibilities and family life.  Family history is significant for dementia only in one aunt in her 70s. Both of her parents were deceased by age 51 due to cardiac issues.   Social History: Born/Raised: [place] Education: Designer, jewellery in [employer] Occupational history: Retired principal Marital history: Married x 54 years Children: 2 (daughter-52yo and son-39yo), 2 grandchildren (20 and 58) Alcohol: No Tobacco: Never SA: Never   Medical History: Past Medical History:  Diagnosis Date  . Anxiety   . Aortic stenosis    a. bicuspid aortic valve; mean gradient of 20 mmHg in 2/10; b. s/p bioprosth AVR (19-mm Edwards pericardial valve) with a redo coronary artery bypass graft procedure in 07/2009; postoperative Dressler's syndrome    . Arthritis   . Carcinoma, renal cell ([place]) 05/2007   Laparoscopic right nephrectomy  . Chronic diastolic CHF (congestive heart failure) ([place])    a. 9.2014 EchoP EF 60-65%, no rwma, bioprosth AVR, mean gradient of 56mHg, mild to mod MR, PASP 53mg.  . CKD (chronic kidney disease), stage II   . Colitis, ischemic ([facility]  . Coronary artery disease    a. initial CABG with SVG-RCA in 12/05. b. redo SVG-RCA and bioprosthetic AVR in 1/11. d. 9/14 - PCI with DES to distal LM/proximal LAD was done rather than bypass as she would have been a poor candidate for redo sternotomy. d. S/p DES to prox LM 05/2014.  . Diabetes mellitus 03/2010    TYPE II: Hemoglobin A1c of 7.4 in  03/2010; 8.4 in 06/2010; treated with insulin pump  . Duodenal ulcer    Remote; H. pylori positive  . Gastroparesis due to DM ([facility] [place]/04/2012    . Hodgkin's disease ([facility]   [place] radiation therapy  . Hyperlipidemia   . Hyperplastic gastric polyp 06/23/2012  . Hypertension   . Iron deficiency anemia    a. 03/2013 EGD: essentially normal. Possible slow GIB.  . Migraines   . NASH (nonalcoholic steatohepatitis) 1999   -biopsy in 1999  . Osteopenia     hip on DEXA in October 2007.  . Osteoporosis   . PAF (paroxysmal atrial fibrillation) ([facility] [place]   a.  h/o brief episodes noted on ppm interrogation. b. given GI bleeding and need to be on both ASA 81 and Brilinta, risks likely outweigh benefits of anticoagulation.   . Small bowel obstruction ([facility])    Recurrent; resolved after resection of a lipoma  . Syncope    a. H/o recurrent syncope with pauses on loop recorder s/p Medtronic pacemaker 07/2012.      Current Medications:  Outpatient Encounter Prescriptions as of 12/31/2016  Medication Sig  . Alirocumab (PRALUENT [place]) Inject 150 mLs into the skin every 14 (fourteen) days.  . ALPRAZolam (XANAX) 0.5 MG tablet Take 0.5 mg by mouth at bedtime. May take additional dose daily for anxiety  . amLODipine (NORVASC) 5 MG tablet  Take 1 tablet (5 mg total) by mouth daily.  . APIDRA 100 UNIT/ML injection Inject 46 Units into the skin as directed. INFUSE THROUGH INSULIN PUMP UTD::[place] 46.15 units daily, [place] depends on carb intake  . aspirin EC 81 MG tablet Take 81 mg by mouth at bedtime.  Marland Kitchen azithromycin (ZITHROMAX) 500 MG tablet 1 tablet by mouth one hour prior to dental appointment/procedure (Patient not taking: Reported on 12/27/2016)  . BRILINTA 60 MG TABS tablet TAKE 1 TABLET(60 MG) BY MOUTH TWICE DAILY  . Calcium Carbonate-Vitamin D (RA CALCIUM PLUS VITAMIN D) 600-400 MG-UNIT per tablet Take 1 tablet by mouth daily.   . Cholecalciferol 2000 UNITS TABS Take 1 tablet by mouth every morning.   . clobetasol (TEMOVATE) 0.05 % external solution Apply 1 application topically daily as needed (for hives). Reported on 12/05/2015  . Coenzyme Q10 (CO  Q 10 PO) Take 1 tablet by mouth at bedtime.  . colchicine 0.6 MG tablet Take 0.6 mg by mouth at bedtime. And,Take 1 tablet at onset and repeate in 1 hour and then 1 daily x 2 (FOR FLARES)   . doxycycline (VIBRAMYCIN) 100 MG capsule Take 1 capsule (100 mg total) by mouth 2 (two) times daily.  . Fe Fum-FA-B Cmp-C-Zn-Mg-Mn-Cu (HEMATINIC PLUS COMPLEX) 106-1 MG TABS Take 1 tablet oraly three times a week or as directed daily with with a Vitamin C tablet (Patient taking differently: Take 1 tablet by mouth every Monday, Wednesday, and Friday. Take 1 tablet oraly three times a week or as directed daily with with a Vitamin C tablet)  . glucagon (GLUCAGON EMERGENCY) 1 MG injection Inject 1 mg into the vein once as needed (for severe reaction). Reported on 12/05/2015  . isosorbide mononitrate (IMDUR) 60 MG 24 hr tablet TAKE 1 TABLET BY MOUTH ONCE DAILY  . latanoprost (XALATAN) 0.005 % ophthalmic solution Place 1 drop into both eyes at bedtime.  . Magnesium Oxide 250 MG TABS Take 250 mg by mouth at bedtime.   . metoprolol (LOPRESSOR) 50 MG tablet TAKE 1 AND 1/2 TABLETS(75 MG) BY MOUTH TWICE DAILY  . Multiple Vitamins-Minerals (MULTIVITAMIN WITH MINERALS) tablet Take 1 tablet by mouth every morning.   . nitroGLYCERIN (NITROLINGUAL) 0.4 MG/SPRAY spray Place 1 spray under the tongue every 5 (five) minutes as needed for chest pain. (Patient not taking: Reported on 12/27/2016)  . Omega-3 Fatty Acids (FISH OIL) 1000 MG CAPS Take 1,000 mg by mouth at bedtime.   Marland Kitchen omeprazole (PRILOSEC) 20 MG capsule Take 20 mg by mouth every other day. TAKES IN THE EVENING  . potassium chloride (K-DUR,KLOR-CON) 10 MEQ tablet Take 10 mEq by mouth as directed. Take 6 tablets in the AM and 4 tablets in the PM  . pravastatin (PRAVACHOL) 10 MG tablet TAKE 1 TABLET BY MOUTH ONCE DAILY  . torsemide (DEMADEX) 20 MG tablet Take 60 mg (3 Tablets) in the AM and 40 mg (2 Tablets) in the PM.  . triamcinolone cream (KENALOG) 0.5 % Apply 1 application  topically 3 (three) times daily.  Marland Kitchen ULORIC 40 MG tablet Take 40 mg by mouth at bedtime.    No facility-administered encounter medications on file as of 12/31/2016.      Behavioral Observations:   Appearance: Neatly and appropriately dressed and groomed Gait: Ambulated independently, no gross abnormalities observed Speech: Fluent; mildly reduced rate, normal rhythm and volume. Mild word finding difficulty. Thought process: Linear, goal directed Affect: Full, generally euthymic, does become tearful when speaking about physical limitations Interpersonal: Pleasant, appropriate  TESTING: There is medical necessity to proceed with neuropsychological assessment as the results will be used to aid in differential diagnosis and clinical decision-making and to inform specific treatment recommendations. Per the patient and medical records reviewed, there has been a change in cognitive functioning and a reasonable suspicion of neurocognitive disorder.  Following the clinical interview, the patient completed a full battery of neuropsychological testing with my psychometrician under my supervision.   PLAN: The patient will return to see me for a follow-up session at which time her test performances and my impressions and treatment recommendations will be reviewed in detail.  Full report to follow.

## 2017-01-01 ENCOUNTER — Ambulatory Visit (HOSPITAL_BASED_OUTPATIENT_CLINIC_OR_DEPARTMENT_OTHER): Payer: Medicare Other | Admitting: Genetics

## 2017-01-01 ENCOUNTER — Other Ambulatory Visit: Payer: Medicare Other

## 2017-01-01 ENCOUNTER — Encounter: Payer: Self-pay | Admitting: Genetics

## 2017-01-01 DIAGNOSIS — Z853 Personal history of malignant neoplasm of breast: Secondary | ICD-10-CM

## 2017-01-01 DIAGNOSIS — Z85528 Personal history of other malignant neoplasm of kidney: Secondary | ICD-10-CM | POA: Diagnosis not present

## 2017-01-01 DIAGNOSIS — Z803 Family history of malignant neoplasm of breast: Secondary | ICD-10-CM

## 2017-01-01 DIAGNOSIS — Z8571 Personal history of Hodgkin lymphoma: Secondary | ICD-10-CM | POA: Diagnosis not present

## 2017-01-01 DIAGNOSIS — Z7183 Encounter for nonprocreative genetic counseling: Secondary | ICD-10-CM

## 2017-01-01 NOTE — Progress Notes (Signed)
REFERRING PROVIDER: Nicholas Lose, MD [address] [place],  [id number]  PRIMARY PROVIDER:  Sharilyn Sites, MD  PRIMARY REASON FOR VISIT:  1. Personal history of breast cancer   2. Personal history of Hodgkin lymphoma   3. Personal history of renal cancer   4. Family history of breast cancer     HISTORY OF PRESENT ILLNESS:   Ms. Oconnor, a 66 y.o. female, was seen for a [place] cancer genetics consultation at the request of Dr. Lindi Adie due to a personal and family history of cancer.  Ms. Noori presents to clinic today to discuss the possibility of a hereditary predisposition to cancer, genetic testing, and to further clarify her future cancer risks, as well as potential cancer risks for family members.   CANCER HISTORY: Ms. Tipping has a history of three different cancers. She was first diagnosed with Hodgkin lymphoma in 74 at the age of 47. This was treated with mantle radiation. Ms. Marcantonio was diagnosed with chromophobe renal cancer at age 46 which was treated with right nephrectomy. In August 2013, at the age of 89, Ms. 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 This was treated with mastectomy. Ms. Mayor's health history is also significant for TAH/BSO in 2000. She reports that she underwent hormone replacement therapy for approximately 10 years after her surgery.  Past Medical History:  Diagnosis Date  . Anxiety   . Aortic stenosis    a. bicuspid aortic valve; mean gradient of 20 mmHg in 2/10; b. s/p bioprosth AVR (19-mm Edwards pericardial valve) with a redo coronary artery bypass graft procedure in 07/2009; postoperative Dressler's syndrome    . Arthritis   . Carcinoma, renal cell ([place]) 05/2007   Laparoscopic right nephrectomy  . Chronic diastolic CHF (congestive heart failure) ([place])    a. 9.2014 EchoP EF 60-65%, no rwma, bioprosth AVR, mean gradient of 5mHg, mild to mod MR, PASP 521mg.  . CKD (chronic kidney disease), stage  II   . Colitis, ischemic ([facility]  . Coronary artery disease    a. initial CABG with SVG-RCA in 12/05. b. redo SVG-RCA and bioprosthetic AVR in 1/11. d. 9/14 - PCI with DES to distal LM/proximal LAD was done rather than bypass as she would have been a poor candidate for redo sternotomy. d. S/p DES to prox LM 05/2014.  . Diabetes mellitus 03/2010    TYPE II: Hemoglobin A1c of 7.4 in  03/2010; 8.4 in 06/2010; treated with insulin pump  . Duodenal ulcer    Remote; H. pylori positive  . Gastroparesis due to DM ([facility]/04/2012  . Hodgkin's disease ([facility]   Mantle radiation therapy  . Hyperlipidemia   . Hyperplastic gastric polyp 06/23/2012  . Hypertension   . Iron deficiency anemia    a. 03/2013 EGD: essentially normal. Possible slow GIB.  . Migraines   . NASH (nonalcoholic steatohepatitis) 1999   -biopsy in 1999  . Osteopenia     hip on DEXA in October 2007.  . Osteoporosis   . PAF (paroxysmal atrial fibrillation) ([facility]   a.  h/o brief episodes noted on ppm interrogation. b. given GI bleeding and need to be on both ASA 81 and Brilinta, risks likely outweigh benefits of anticoagulation.   . Small bowel obstruction ([facility])    Recurrent; resolved after resection of a lipoma  . Syncope    a. H/o recurrent syncope with pauses on loop recorder s/p Medtronic pacemaker 07/2012.    Past Surgical History:  Procedure Laterality Date  . AORTIC VALVE REPLACEMENT  2011   Aortic valve replacement surgery with a 19 mm bioprosthetic device post redo CABG January 2011.  Marland Kitchen BONE MARROW BIOPSY    . BREAST EXCISIONAL BIOPSY     benign, bilateral  . CERVICAL BIOPSY    . COLONOSCOPY     Multiple with adenomatous polyps  . COLONOSCOPY  06/23/2012   Procedure: COLONOSCOPY;  Surgeon: Gatha Mayer, MD;  Location: [facility] ENDOSCOPY;  Service: Endoscopy;  Laterality: N/A;  . CORONARY ARTERY BYPASS GRAFT  2005, 2011   CABG-most recent SVG to RCA-12/05; RCA occluded with patent graft in 2006; Redo bypass in  07/2009  . ESOPHAGOGASTRODUODENOSCOPY    . ESOPHAGOGASTRODUODENOSCOPY  06/23/2012   Procedure: ESOPHAGOGASTRODUODENOSCOPY (EGD);  Surgeon: Gatha Mayer, MD;  Location: [facility];  Service: Endoscopy;  Laterality: N/A;  . ESOPHAGOGASTRODUODENOSCOPY N/A 04/06/2013   Procedure: ESOPHAGOGASTRODUODENOSCOPY (EGD);  Surgeon: Irene Shipper, MD;  Location: [facility] ENDOSCOPY;  Service: Endoscopy;  Laterality: N/A;  . EXPLORATORY LAPAROTOMY W/ BOWEL RESECTION     Small bowel resection of lipoma  . GIVENS CAPSULE STUDY N/A 04/30/2013   Procedure: GIVENS CAPSULE STUDY;  Surgeon: Gatha Mayer, MD;  Location: [place];  Service: Endoscopy;  Laterality: N/A;  . LEFT AND RIGHT HEART CATHETERIZATION WITH CORONARY ANGIOGRAM N/A 04/07/2013   Procedure: LEFT AND RIGHT HEART CATHETERIZATION WITH CORONARY ANGIOGRAM;  Surgeon: Larey Dresser, MD;  Location[facility] CATH LAB;  Service: Cardiovascular;  Laterality: N/A;  . LEFT ATRIAL APPENDAGE OCCLUSION N/A 02/16/2016   Watchman not placed - nickel allergy  . LEFT HEART CATHETERIZATION WITH CORONARY/GRAFT ANGIOGRAM N/A 06/07/2014   Procedure: LEFT HEART CATHETERIZATION WITH Beatrix Fetters;  Surgeon: Burnell Blanks, MD;  Location: [facility] CATH LAB;  Service: Cardiovascular;  Laterality: N/A;  . LIVER BIOPSY    . LOOP RECORDER IMPLANT N/A 12/10/2011   Procedure: LOOP RECORDER IMPLANT;  Surgeon: Evans Lance, MD;  Location[facility] C[facility] LAB;  Service: Cardiovascular;  Laterality: N/A;  . MALONEY DILATION  06/23/2012   Procedure: [name] DILATION;  Surgeon: Gatha Mayer, MD;  Location: [facility] ENDOSCOPY;  Service: Endoscopy;;  54 fr  . MASTECTOMY Left 05/17/2015   total   . NEPHRECTOMY  2008   Laparoscopic right nephrectomy, renal cell carcinoma  . PACEMAKER INSERTION  08/21/2012   Medtronic Adapta L dual-chamber pacemaker  . PERCUTANEOUS CORONARY STENT INTERVENTION (PCI-S) N/A 04/09/2013   Procedure: PERCUTANEOUS CORONARY STENT INTERVENTION (PCI-S);  Surgeon:  Burnell Blanks, MD;  Location: [facility] CATH LAB;  Service: Cardiovascular;  Laterality: N/A;  . PERMANENT PACEMAKER INSERTION N/A 08/21/2012   Procedure: PERMANENT PACEMAKER INSERTION;  Surgeon: Evans Lance, MD;  Location: [facility] CATH LAB;  Service: Cardiovascular;  Laterality: N/A;  . TEE WITHOUT CARDIOVERSION N/A 12/07/2015   Procedure: TRANSESOPHAGEAL ECHOCARDIOGRAM (TEE);  Surgeon: Larey Dresser, MD;  Location: [place];  Service: Cardiovascular;  Laterality: N/A;  . TOTAL ABDOMINAL HYSTERECTOMY W/ BILATERAL SALPINGOOPHORECTOMY  2000  . TOTAL MASTECTOMY Left 05/17/2015   Procedure: LEFT TOTAL MASTECTOMY;  Surgeon: Rolm Bookbinder, MD;  Location: [place];  Service: General;  Laterality: Left;  . TUMOR EXCISION  1981   removal of Hodgkins lymphoma    Social History   Social History  . Marital status: Married    Spouse name: N/A  . Number of children: 2  . Years of education: N/A   Occupational History  . retired     Equities trader principal   Social History Main  Topics  . Smoking status: Never Smoker  . Smokeless tobacco: Never Used  . Alcohol use No  . Drug use: No  . Sexual activity: Yes   Other Topics Concern  . 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  Significant diagnoses are listed below: Family History  Problem Relation Age of Onset  . Heart attack Mother        d.61  . Heart disease Mother   . Heart attack Father        d.60  . Diabetes Father   . Heart disease Father   . Lung cancer Sister        d.61 metastases to brain. History of smoking. Maternal half-sister.  . Cancer Sister 30       unspecified gynecologic cancer (either cervical or ovarian) in her 80s.  . Cancer Maternal Grandfather 50       d.52s oral cancer  . Hypertension Brother   . Glaucoma Brother   . Breast cancer Sister 57       d.42  from metastatic disease. Paternal half-sister.  . Colon cancer Neg Hx   . Stomach cancer Neg Hx    Ms. Tartaglia has a daughter, age 71, and son, age 73. Neither of her children have cancers. Ms. Myung has a full brother, age 20, without cancers. She also has a maternal half-sister, Holland Commons, who died at 1 from lung cancer that metastasized to her brain. Millie lived 6 months after her lung cancer was diagnosed. She also had a history of either cervical or ovarian cancer in her 55s. Ms. Kuehnle reports that her mother said Millie had ovarian cancer, but Ms. Helmkamp has started reporting it as cervical cancer since other medical professionals have told her that it is unlikely that [place] had ovarian cancer at such a young age and lived for many years after her diagnosis. Ms. Kage also has two paternal half-sisters. One sister died at age 43 from breast cancer diagnosed at age 13. The other paternal half-sister is not known to have cancers, but Ms. Moder has not had contact with her since 09/28/1974.  Ms. Pho mother died at 57 from a heart attack. Ms. Kukla had four maternal uncles and three maternal aunts. None had cancers. Ms. Pitre's maternal grandfather died in his early-50s from oral cancer. Ms. Behring's maternal grandmother died at 66 from a heart attack.   Ms. Montour's father died at 47 from a heart attack. Ms. Tibbs had four paternal aunts and four paternal uncles who lived to adulthood. None had cancers. Ms. Mowers's paternal grandfather died younger than age 19 from heart problems. Her paternal grandmother died in her early-60s without cancers.  Ms. Mcquinn is unaware of previous family history of genetic testing for hereditary cancer risks. Patient's maternal ancestors are of [place] descent, and paternal ancestors are of [name] descent. There is no reported Ashkenazi Jewish ancestry. There is no known consanguinity.  GENETIC COUNSELING ASSESSMENT: NEA GITTENS is  a 66 y.o. female with a personal and family history which is somewhat suggestive of a hereditary cancer syndrome and predisposition to cancer. We, therefore, discussed and recommended the following at today's visit.   DISCUSSION: We reviewed the characteristics, features and inheritance patterns of hereditary cancer syndromes. We also discussed genetic testing, including the appropriate family members to test, the process of  testing, insurance coverage and turn-around-time for results. We discussed the implications of a negative, positive and/or variant of uncertain significant result. We recommended Ms. Hinchliffe pursue genetic testing for the Multi-Cancers Panel offered by [facility]. [facility]'s Multi-Cancer Panel includes analysis of the following 83 genes: ALK, APC, ATM, AXIN2, BAP1, BARD1, BLM, BMPR1A, BRCA1, BRCA2, BRIP1, CASR, CDC73, CDH1, CDK4, CDKN1B, CDKN1C, CDKN2A, CEBPA, CHEK2, CTNNA1, DICER1, DIS3L2, EGFR, EPCAM, FH, FLCN, GATA2, GPC3, GREM1, HOXB13, HRAS, KIT, MAX, MEN1, MET, MITF, MLH1, MSH2, MSH3, MSH6, MUTYH, NBN, NF1, NF2, NTHL1, PALB2, PDGFRA, PHOX2B, PMS2, POLD1, POLE, POT1, PRKAR1A, PTCH1, PTEN, RAD50, RAD51C, RAD51D, RB1, RECQL4, RET, RUNX1, SDHA, SDHAF2, SDHB, SDHC, SDHD, SMAD4, SMARCA4, SMARCB1, SMARCE1, STK11, SUFU, TERC, TERT, TMEM127, TP53, TSC1, TSC2, VHL, WRN, WT1.   Based on Ms. Sidener's personal and family history of cancer, she meets medical criteria for genetic testing. Despite that she meets criteria, she may still have an out of pocket cost. We discussed that if her out of pocket cost for testing is over $100, the laboratory will call and confirm whether she wants to proceed with testing.  If the out of pocket cost of testing is less than $100 she will be billed by the genetic testing laboratory.   PLAN: After considering the risks, benefits, and limitations, Ms. Seitzinger  provided informed consent to pursue genetic testing and the blood sample was sent to [facility]  for analysis of the 83-gene Multi-Cancers panel. Results should be available within approximately 3 weeks' time, at which point they will be disclosed by telephone to Ms. Pitt, as will any additional recommendations warranted by these results. This information will also be available in Epic.   Lastly, we encouraged Ms. Ranum to remain in contact with cancer genetics annually so that we can continuously update the family history and inform her of any changes in cancer genetics and testing that may be of benefit for this family.   Ms.  Mian questions were answered to her satisfaction today. Our contact information was provided should additional questions or concerns arise. Thank you for the referral and allowing [name] to share in the care of your patient.   Mal Misty, MS, [facility] Certified Naval [email or web address] phone: [phone]  The patient was seen for a total of 40 minutes in face-to-face genetic counseling.  This patient was discussed with Drs. Magrinat, Lindi Adie and/or Burr Medico who agrees with the above.    _______________________________________________________________________ For Office Staff:  Number of people involved in session: 1 Was an Intern/ student involved with case: no

## 2017-01-02 ENCOUNTER — Encounter (HOSPITAL_COMMUNITY): Payer: Self-pay | Admitting: Cardiology

## 2017-01-03 ENCOUNTER — Other Ambulatory Visit (HOSPITAL_COMMUNITY): Payer: Self-pay | Admitting: *Deleted

## 2017-01-03 MED ORDER — TORSEMIDE 20 MG PO TABS: ORAL_TABLET | ORAL | 6 refills | Status: DC

## 2017-01-14 ENCOUNTER — Ambulatory Visit: Payer: Self-pay | Admitting: Genetics

## 2017-01-14 ENCOUNTER — Telehealth: Payer: Self-pay | Admitting: Genetics

## 2017-01-14 ENCOUNTER — Encounter: Payer: Self-pay | Admitting: Genetics

## 2017-01-14 DIAGNOSIS — Z1379 Encounter for other screening for genetic and chromosomal anomalies: Secondary | ICD-10-CM

## 2017-01-14 HISTORY — DX: Encounter for other screening for genetic and chromosomal anomalies: Z13.79

## 2017-01-14 NOTE — Telephone Encounter (Signed)
Reviewed that germline genetic testing revealed no pathogenic mutations. This is considered to be a negative result. Testing was performed through [facility]'s 83-gene Multi-Cancers Panel. [facility]'s Multi-Cancers Panel includes analysis of the following 83 genes: ALK, APC, ATM, AXIN2, BAP1, BARD1, BLM, BMPR1A, BRCA1, BRCA2, BRIP1, CASR, CDC73, CDH1, CDK4, CDKN1B, CDKN1C, CDKN2A, CEBPA, CHEK2, CTNNA1, DICER1, DIS3L2, EGFR, EPCAM, FH, FLCN, GATA2, GPC3, GREM1, HOXB13, HRAS, KIT, MAX, MEN1, MET, MITF, MLH1, MSH2, MSH3, MSH6, MUTYH, NBN, NF1, NF2, NTHL1, PALB2, PDGFRA, PHOX2B, PMS2, POLD1, POLE, POT1, PRKAR1A, PTCH1, PTEN, RAD50, RAD51C, RAD51D, RB1, RECQL4, RET, RUNX1, SDHA, SDHAF2, SDHB, SDHC, SDHD, SMAD4, SMARCA4, SMARCB1, SMARCE1, STK11, SUFU, TERC, TERT, TMEM127, TP53, TSC1, TSC2, VHL, WRN, WT1.  A variant of uncertain significance (VUS) was noted in BLM. The specific BLM variant is c.[id number] ([id number]). Discussed that this VUS should not change clinical management.  For more detailed discussion, please see genetic counseling documentation from 01/14/2017. Result report dated 01/08/2017.

## 2017-01-14 NOTE — Progress Notes (Signed)
HPI: [name] [name] was previously seen in the [facility] on 01/01/2017 due to a personal and family history of cancer and concerns regarding a hereditary predisposition to cancer. Please refer to our prior cancer genetics clinic note for more information regarding [name] [name]'s medical, social and family histories, and our assessment and recommendations, at the time. [name] [name] recent genetic test results were disclosed to her, as were recommendations warranted by these results. These results and recommendations are discussed in more detail below.  CANCER HISTORY: [name] [name] has a history of three different cancers. She was first diagnosed with Hodgkin lymphoma in 54 at the age of 62. This was treated with mantle radiation. 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 This was treated with mastectomy. [name] [name]'s health history is also significant for TAH/BSO in 10/15/98. She reports that she underwent hormone replacement therapy for approximately 10 years after her surgery.   FAMILY HISTORY:  We obtained a detailed, 4-generation family history.  Significant diagnoses are listed below: Family History  Problem Relation Age of Onset  . Heart attack Mother        d.61  . Heart disease Mother   . Heart attack Father        d.60  . Diabetes Father   . Heart disease Father   . Lung cancer Sister        d.61 metastases to brain. History of smoking. Maternal half-sister.  . Cancer Sister 30       unspecified gynecologic cancer (either cervical or ovarian) in her 4s.  . Cancer Maternal Grandfather 50       d.52s oral cancer  . Hypertension Brother   . Glaucoma Brother   . Breast cancer Sister 42       d.42 from metastatic disease. Paternal half-sister.  . Colon cancer Neg Hx   . Stomach cancer Neg Hx     [name] [name] has a daughter, age 26, and son, age 34. Neither of her children have cancers. [name] has a full brother, age 46, without cancers. She also has a maternal half-sister, [name] [name], who died at 70 from lung cancer that metastasized to her brain. [name] lived 6 months after her lung cancer was diagnosed. She also had a history of either cervical or ovarian cancer in her 51s. [name] [name] reports that her mother said [name] had ovarian cancer, but [name] [name] has started reporting it as cervical cancer since other medical professionals have told her that it is unlikely that [name] had ovarian cancer at such a young age and lived for many years after her diagnosis. [name] [name] also has two paternal half-sisters. One sister died at age 83 from breast cancer diagnosed at age 50. The other paternal half-sister is not known to have cancers, but [name] has not had contact with her since October 15, 1974.  [name] [name] mother died at 41 from a heart attack. [name] [name] had four maternal uncles and three maternal aunts. None had cancers. [name] [name]'s maternal grandfather died in his early-50s from oral cancer. [name] [name]'s maternal grandmother died at 4 from a heart attack.   [name] [name]'s father died at 16 from a heart attack. [name] had four paternal aunts and four paternal uncles who lived to adulthood. None had cancers. [name]'s paternal grandfather died younger than  age 66 from heart problems. Her paternal grandmother died in her early-60s without cancers.  [name] is unaware of previous family history of genetic testing for hereditary cancer risks. Patient's maternal ancestors are of [place] descent, and paternal ancestors are of [name] descent. There is no reported Ashkenazi Jewish ancestry. There is no known consanguinity.  GENETIC TEST RESULTS: Genetic testing performed through [facility] reported out on 01/08/2017 showed no pathogenic mutations.  [facility] includes analysis of the following 83 genes: ALK, APC, ATM, AXIN2, BAP1, BARD1, BLM, BMPR1A, BRCA1, BRCA2, BRIP1, CASR, CDC73, CDH1, CDK4, CDKN1B, CDKN1C, CDKN2A, CEBPA, CHEK2, CTNNA1, DICER1, DIS3L2, EGFR, EPCAM, FH, FLCN, GATA2, GPC3, GREM1, HOXB13, HRAS, KIT, MAX, MEN1, MET, MITF, MLH1, MSH2, MSH3, MSH6, MUTYH, NBN, NF1, NF2, NTHL1, PALB2, PDGFRA, PHOX2B, PMS2, POLD1, POLE, POT1, PRKAR1A, PTCH1, PTEN, RAD50, RAD51C, RAD51D, RB1, RECQL4, RET, RUNX1, SDHA, SDHAF2, SDHB, SDHC, SDHD, SMAD4, SMARCA4, SMARCB1, SMARCE1, STK11, SUFU, TERC, TERT, TMEM127, TP53, TSC1, TSC2, VHL, WRN, WT1.  A variant of uncertain significance (VUS) called BLM [id number] (p.[id number]) was also noted. At this time, it is unknown if this variant is associated with increased cancer risk or if this is a normal finding, but most variants such as this get reclassified to being inconsequential. It should not be used to make medical management decisions. With time, we suspect the lab will determine the significance of this variant, if any. If we do learn more about it, we will try to contact [name] to discuss it further. However, it is important to stay in touch with [name] periodically and keep the address and phone number up to date.   The test report will be scanned into EPIC and will be located under the Molecular Pathology section of the Results Review tab.A portion of the result report is included below for reference.     We discussed with [name] that since the current genetic testing is not perfect, it is possible there may be a gene mutation in one of these genes that current testing cannot detect, but that chance is small. We also discussed, that it is possible that another gene that has not yet been discovered, or that we have not yet tested, is responsible for the cancer diagnoses in the family. Therefore, important to remain in touch with cancer genetics in the future so that we can  continue to offer [name] the most up to date genetic testing.   CANCER SCREENING RECOMMENDATIONS: This result indicates that it is unlikely [name] has an increased risk for a future cancer due to a mutation in one of these genes. However, we must interpret these negative results with some caution.  Families with features suggestive of hereditary risk for cancer tend to have multiple family members with cancer, diagnoses in multiple generations and diagnoses before the age of 51. Ms. Siglin family exhibits some of these features. Thus this result may simply reflect our current inability to detect all mutations within these genes or there may be a different gene that has not yet been discovered or tested. However, we also discussed Ms. Euceda's history of environmental exposures that may have increased her risk for some cancers. For instance, treatment with mantle radiation and hormone replacement therapy have both been associated with increased risk to develop breast cancer. We discussed that we cannot contribute all of her cancers to environmental exposures, however at least her breast cancer risk was likely increased by these treatments.  Because no causative or actionable mutations were  identified, it is recommended she continue to follow the cancer management and screening guidelines provided by her oncology and primary healthcare providers. In addition to following age-related cancer screening recommendations for the general population, Ms. Panetta's ongoing cancer screenings should be tailored based on her personal and family history of cancers. For instance, NCCN guidelines for breast cancer surveillance for women who have a history of mantle radiation treatment are included below for reference. Ms. Huxford is encouraged to discuss these guidelines with her physicians to determine whether this high-risk screening protocol is indicated in her particular case.      RECOMMENDATIONS  FOR FAMILY MEMBERS: Though Ms. Roache's cancer risks may have been increased by some environmental exposures, we cannot completely rule out the chance for an undetected heritable cancer risk. Therefore, women in this family might be at some increased risk of developing breast cancer, over the general population risk, simply due to the family history of cancer. We recommended women in this family have a yearly mammogram beginning at age 80, or 76 years younger than the earliest onset of cancer, an annual clinical breast exam, and perform monthly breast self-exams. Women in this family should also have a gynecological exam as recommended by their primary provider. All family members should have a colonoscopy by age 70.  Currently, there are no established screening guidelines for individuals with a family history of renal cancer and Hodgkins lymphoma. If Ms. Oppedisano's family members would like to consider screening for these cancers, they should consult their physicians regarding options.   Based on Ms. Brune's family history, it appears that other family members, such as her living paternal half-sister and the children of her paternal half-sister who is deceased, are candidates for genetic testing. Ms. Deyo reports that she does not have contact with these family members. Ms. Fernando's brother and his children are also candidates for genetic testing and should consult their physicians and/or a genetic counselor if they would like to discuss their hereditary cancer risks and genetic testing options.  FOLLOW-UP: Lastly, we discussed with Ms. Perra that cancer genetics is a rapidly advancing field and it is possible that new genetic tests will be appropriate for her and/or her family members in the future. We encouraged her to remain in contact with cancer genetics on an annual basis so we can update her personal and family histories and let her know of advances in cancer genetics that may benefit  this family.   Our contact number was provided. Ms. Hinnenkamp questions were answered to her satisfaction, and she knows she is welcome to call us at anytime with additional questions or concerns.   Mal Misty, MS, [facility] Certified Naval [email or web address]

## 2017-01-15 ENCOUNTER — Ambulatory Visit (HOSPITAL_BASED_OUTPATIENT_CLINIC_OR_DEPARTMENT_OTHER): Payer: Medicare Other | Attending: Cardiology | Admitting: Cardiology

## 2017-01-15 DIAGNOSIS — G4733 Obstructive sleep apnea (adult) (pediatric): Secondary | ICD-10-CM

## 2017-01-20 NOTE — Procedures (Signed)
   Patient Name: [name] [name], [name] [name] Date: 01/15/2017 Gender: Female D.O.B: 01/10/1951 Age (years): 83 Referring Provider: Fransico Him MD, ABSM Height (inches): 63 Interpreting Physician: Fransico Him MD, ABSM Weight (lbs): 117 RPSGT: Zadie Rhine BMI: 21 MRN: [id number] Neck Size: 12.00  CLINICAL INFORMATION The patient is referred for a CPAP titration to treat sleep apnea.  Date of NPSG, Split Night or HST: 11/06/2016  SLEEP STUDY TECHNIQUE As per the AASM Manual for the Scoring of Sleep and Associated Events v2.3 (April 2016) with a hypopnea requiring 4% desaturations.  The channels recorded and monitored were frontal, central and occipital EEG, electrooculogram (EOG), submentalis EMG (chin), nasal and oral airflow, thoracic and abdominal wall motion, anterior tibialis EMG, snore microphone, electrocardiogram, and pulse oximetry. Continuous positive airway pressure (CPAP) was initiated at the beginning of the study and titrated to treat sleep-disordered breathing.  MEDICATIONS Medications self-administered by patient taken the night of the study : XANAX, APIDRA, ASPIRIN EC, BRILINTA, COLCHICINE, ISOSORBIDE MONONITRATE, METOPROLOL, POTASSIUM CHLORIDE, PRAVASTATIN, TORSEMIDE, ULORIC  TECHNICIAN COMMENTS Comments added by technician: Delta. Patient had difficulty initiating sleep. Patient was restless all through the night.  Comments added by scorer: N/A  RESPIRATORY PARAMETERS  Optimal PAP Pressure (cm): 18  AHI at Optimal Pressure (/hr):0.0 Overall Minimal O2 (%):91.00  Supine % at Optimal Pressure (%):0 Minimal O2 at Optimal Pressure (%): 95.0    SLEEP ARCHITECTURE The study was initiated at 9:50:53 PM and ended at 4:38:37 AM.  Sleep onset time was 9.6 minutes and the sleep efficiency was 80.7%. The total sleep time was 329.1 minutes.  The patient spent 3.65% of the night in stage N1 sleep, 61.68% in stage N2 sleep, 28.30% in stage N3 and 6.38% in  REM.Stage REM latency was 112.5 minutes  Wake after sleep onset was 69.0. Alpha intrusion was absent. Supine sleep was 76.87%.  CARDIAC DATA The 2 lead EKG demonstrated sinus rhythm. The mean heart rate was 69.67 beats per minute. Other EKG findings include: None.  LEG MOVEMENT DATA The total Periodic Limb Movements of Sleep (PLMS) were 0. The PLMS index was 0.00. A PLMS index of <15 is considered normal in adults.  IMPRESSIONS - The optimal PAP pressure was 18 cm of water. - Central sleep apnea was not noted during this titration (CAI = 0.4/h). - Significant oxygen desaturations were not observed during this titration (min O2 = 91.00%). - No snoring was audible during this study. - No cardiac abnormalities were observed during this study. - Clinically significant periodic limb movements were not noted during this study. Arousals associated with PLMs were rare.  DIAGNOSIS - Obstructive Sleep Apnea (327.23 [G47.33 ICD-10])  RECOMMENDATIONS - Trial of CPAP therapy on 18 cm H2O with a Small size Resmed Full Face Mask AirFit F20 mask and heated humidification. - Avoid alcohol, sedatives and other CNS depressants that may worsen sleep apnea and disrupt normal sleep architecture. - Sleep hygiene should be reviewed to assess factors that may improve sleep quality. - Weight management and regular exercise should be initiated or continued. - Return to Sleep Center for re-evaluation after10 weeks.  [place], American Board of Sleep Medicine  ELECTRONICALLY SIGNED ON:  01/20/2017, 12:43 AM [place] PH: [phone]   FX: [phone] [place]

## 2017-01-20 NOTE — Progress Notes (Signed)
NEUROPSYCHOLOGICAL EVALUATION   Name:    [name] [name]  Date of Birth:   03-Jan-1951 Date of Interview:  12/31/2016 Date of Testing:  12/31/2016   Date of Feedback:  01/22/2017       Background Information:  Reason for Referral:  [name] [name] is a 66 y.o. female referred by Dr. Wells Guiles Tat to assess her current level of cognitive functioning and assist in differential diagnosis. The current evaluation consisted of a review of available medical records, an interview with the patient, and the completion of a neuropsychological testing battery. Informed consent was obtained.  History of Presenting Problem:  [name] [name] was in her usual state of health when on 10/08/2016 she had an episode of slurred and "garbled" speech when speaking on the phone (leaving a message for her pharmacy). She checked her blood sugar and it was fine. 10 minutes her husband arrived home and noticed her speech difficulties. Also the pharmacy called back and stated they could not understand her message. The entire episode lasted between 15-30 minutes.She denies any associated lateralizing weakness or paresthesias. No swallowing difficulties. No diplopia. She did feel that she had a dry mouth. She did feel very tired and decided to rest and upon awakening felt normal. She never felt like this previously and never since.   The patient also notes that she has had some forgetfulness over the years since retiring and she is unsure if this is normal for age. She retired in 03/2013 due to CHF. She complains of word finding difficulty which is happening much more frequently and is very frustrating to her. She also reports some forgetfulness for recent conversations but recalls with cueing. Her daughter pointed out to her that she is saying "you know" much more frequently during conversation (her daughter once counted her say it 14 times within a span of 1 1/2 minutes). She denies concentration problems. She denies  reduced attention/concentration. She denies difficulty with visual-spatial skills or navigation. She manages all complex ADLs, including driving, medications, finances, appointments and cooking, and denies any difficulties with these.  A head CT without contrast from 11/15/2016 revealed nothing acute, no white matter disease, and normal for age cerebral volume. An ambulatory EEG completed on 11/28/2016 was abnormal due to occasional focal slowing over the left temporal region. Clinical correlation was advised.  Medical history is significant for Hodgkin's disease diagnosed in 69. She had radiation from her "chin to belly button" for this. 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 She appears to have cirrhosis of the liver also likely due to the history of radiation. She has thyroid nodules which are being closely monitored. She had kidney cancer, also presumed to be secondary to radiation; she had one kidney removed. She has had breast cancer, presumably secondary to radiation. She has had heart issues since age 4, also thought to be related to history of radiation. She has CAD and is s/p CABG (2000) and redo/AVR in 09/8935, diastolic CHF, and sick sinus syndrome s/p PCM.  The patient notes that when she had her first heart surgery  in 2000, it took her several months to return to her cognitive baseline. She eventually did. She was told this may have been due to decreased oxygen to the brain during the surgery. She did not have any cognitive changes following her heart surgery in 2011. She has had multiple other procedures including 16 cardiac catheterizations, pacemaker placement, mastectomy, and kidney resection, with no cognitive sequelae.  Physically, the patient complains of shortness of breath and reduced stamina. She has some difficulty staying asleep. She completed a sleep study due to increased fatigue since January of this year. She was diagnosed with sleep apnea and  is going to have CPAP titration at the end of this month. She has taken 0.25 mg Xanax "for many years" for sleep. She stated that her "brain doesn't stop" and she lies awake if she does not take it. She does not take it any other time.  Psychiatric history is negative. She denies any significant depression or anxiety. She considers herself a positive person. She is frustrated by her inability to complete tasks as quickly and her reduced physical strength.  She is missing cognitive stimulation and challenge from her work. She is staying busy with church responsibilities and family life.  Family history is significant for dementia only in one aunt in her 102s. Both of her parents were deceased by age 54 due to cardiac issues.   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 Anxiety   . Aortic stenosis    a. bicuspid aortic valve; mean gradient of 20 mmHg in 2/10; b. s/p bioprosth AVR (19-mm Edwards pericardial valve) with a redo coronary artery bypass graft procedure in 07/2009; postoperative Dressler's syndrome    . Arthritis   . Carcinoma, renal cell ([place]) 05/2007   Laparoscopic right nephrectomy  . Chronic diastolic CHF (congestive heart failure) ([place])    a. 9.2014 EchoP EF 60-65%, no rwma, bioprosth AVR, mean gradient of 57mHg, mild to mod MR, PASP 525mg.  . CKD (chronic kidney disease), stage II   . Colitis, ischemic ([facility]2008  . Coronary artery disease    a. initial CABG with SVG-RCA in 12/05. b. redo SVG-RCA and bioprosthetic AVR in 1/11. d. 9/14 - PCI with DES to distal LM/proximal LAD was done rather than bypass as she would have been a poor candidate for redo sternotomy. d. S/p DES to prox LM  05/2014.  . Diabetes mellitus 03/2010    TYPE II: Hemoglobin A1c of 7.4 in  03/2010; 8.4 in 06/2010; treated with insulin pump  . Duodenal ulcer    Remote; H. pylori positive  . Gastroparesis due to DM ([facility]10/04/2012  . Genetic testing 01/14/2017   [name] [name] underwent genetic counseling and testing for hereditary cancer syndromes on 01/01/2017. Her results were negative for mutations in all 83 genes analyzed by [facility]'s 83-gene Common Hereditary Cancers Panel. Genes analyzed include: ALK, APC, ATM, AXIN2, BAP1, BARD1, BLM, BMPR1A, BRCA1, BRCA2, BRIP1, CASR, CDC73, CDH1, CDK4, CDKN1B, CDKN1C, CDKN2A, CEBPA, CHEK2, CTNNA1, DICER1, DIS3L2,   . Hodgkin's disease ([facility]al1991   [facility]le radiation therapy  . Hyperlipidemia   . Hyperplastic gastric polyp 06/23/2012  . Hypertension   . Iron deficiency anemia    a. 03/2013 EGD: essentially normal. Possible slow GIB.  . Migraines   . NASH (nonalcoholic steatohepatitis) 1999   -biopsy in 1999  . Osteopenia     hip on DEXA in October 2007.  . Osteoporosis   . PAF (paroxysmal atrial fibrillation) ([facility]   a.  h/o brief episodes noted on ppm interrogation. b. given GI bleeding and need to be on both ASA 81 and Brilinta, risks likely outweigh benefits of  anticoagulation.   . Small bowel obstruction (H[facility])    Recurrent; resolved after resection of a lipoma  . Syncope    a. H/o recurrent syncope with pauses on loop recorder s/p Medtronic pacemaker 07/2012.    Current medications:  Outpatient Encounter Prescriptions as of 01/22/2017  Medication Sig  . Alirocumab (PRALUENT [place]) Inject 150 mLs into the skin every 14 (fourteen) days.  . ALPRAZolam (XANAX) 0.5 MG tablet Take 0.5 mg by mouth at bedtime. May take additional dose daily for anxiety  . amLODipine (NORVASC) 5 MG tablet Take 1 tablet (5 mg total) by mouth daily.  . APIDRA 100 UNIT/ML injection Inject 46 Units into the skin as directed. INFUSE THROUGH INSULIN PUMP UTD::[place] 46.15 units daily, Bolus  depends on carb intake  . aspirin EC 81 MG tablet Take 81 mg by mouth at bedtime.  Marland Kitchen azithromycin (ZITHROMAX) 500 MG tablet 1 tablet by mouth one hour prior to dental appointment/procedure (Patient not taking: Reported on 12/27/2016)  . BRILINTA 60 MG TABS tablet TAKE 1 TABLET(60 MG) BY MOUTH TWICE DAILY  . Calcium Carbonate-Vitamin D (RA CALCIUM PLUS VITAMIN D) 600-400 MG-UNIT per tablet Take 1 tablet by mouth daily.   . Cholecalciferol 2000 UNITS TABS Take 1 tablet by mouth every morning.   . clobetasol (TEMOVATE) 0.05 % external solution Apply 1 application topically daily as needed (for hives). Reported on 12/05/2015  . Coenzyme Q10 (CO Q 10 PO) Take 1 tablet by mouth at bedtime.  . colchicine 0.6 MG tablet Take 0.6 mg by mouth at bedtime. And,Take 1 tablet at onset and repeate in 1 hour and then 1 daily x 2 (FOR FLARES)   . doxycycline (VIBRAMYCIN) 100 MG capsule Take 1 capsule (100 mg total) by mouth 2 (two) times daily.  . Fe Fum-FA-B Cmp-C-Zn-Mg-Mn-Cu (HEMATINIC PLUS COMPLEX) 106-1 MG TABS Take 1 tablet oraly three times a week or as directed daily with with a Vitamin C tablet (Patient taking differently: Take 1 tablet by mouth every Monday, Wednesday, and Friday. Take 1 tablet oraly three times a week or as directed daily with with a Vitamin C tablet)  . glucagon (GLUCAGON EMERGENCY) 1 MG injection Inject 1 mg into the vein once as needed (for severe reaction). Reported on 12/05/2015  . isosorbide mononitrate (IMDUR) 60 MG 24 hr tablet TAKE 1 TABLET BY MOUTH ONCE DAILY  . latanoprost (XALATAN) 0.005 % ophthalmic solution Place 1 drop into both eyes at bedtime.  . Magnesium Oxide 250 MG TABS Take 250 mg by mouth at bedtime.   . metoprolol (LOPRESSOR) 50 MG tablet TAKE 1 AND 1/2 TABLETS(75 MG) BY MOUTH TWICE DAILY  . Multiple Vitamins-Minerals (MULTIVITAMIN WITH MINERALS) tablet Take 1 tablet by mouth every morning.   . nitroGLYCERIN (NITROLINGUAL) 0.4 MG/SPRAY spray Place 1 spray under the  tongue every 5 (five) minutes as needed for chest pain. (Patient not taking: Reported on 12/27/2016)  . Omega-3 Fatty Acids (FISH OIL) 1000 MG CAPS Take 1,000 mg by mouth at bedtime.   Marland Kitchen omeprazole (PRILOSEC) 20 MG capsule Take 20 mg by mouth every other day. TAKES IN THE EVENING  . potassium chloride (K-DUR,KLOR-CON) 10 MEQ tablet Take 10 mEq by mouth as directed. Take 6 tablets in the AM and 4 tablets in the PM  . pravastatin (PRAVACHOL) 10 MG tablet TAKE 1 TABLET BY MOUTH ONCE DAILY  . torsemide (DEMADEX) 20 MG tablet Take 60 mg (3 Tablets) in the AM and 40 mg (2 Tablets) in the PM.  .  triamcinolone cream (KENALOG) 0.5 % Apply 1 application topically 3 (three) times daily.  Marland Kitchen ULORIC 40 MG tablet Take 40 mg by mouth at bedtime.    No facility-administered encounter medications on file as of 01/22/2017.      Current Examination:  Behavioral Observations:   Appearance: Neatly and appropriately dressed and groomed Gait: Ambulated independently, no gross abnormalities observed Speech: Fluent; mildly reduced rate, normal rhythm and volume. Mild word finding difficulty. Thought process: Linear, goal directed Affect: Full, generally euthymic, does become tearful when speaking about physical limitations Interpersonal: Pleasant, appropriate Orientation: Oriented to person, place and most aspects of time (was not sure of the exact date). Accurately named the current President and his predecessor.    Tests Administered: . Test of Premorbid Functioning (TOPF) . Wechsler Adult Intelligence Scale-Fourth Edition (WAIS-IV): Similarities, Music therapist, Coding and Digit Span subtests . Wechsler Memory Scale-Fourth Edition (WMS-IV)  Adult Version (ages 56-69): Logical Memory I, II and Recognition subtests  . [place] Verbal Learning Test - 2nd Edition (CVLT-2) Standard Form . Repeatable Battery for the Assessment of Neuropsychological Status (RBANS) Form A:  Figure Copy and Recall subtests and Semantic  Fluency subtest . Neuropsychological Assessment Battery (NAB) Language Module, Form 1: Naming subtest . [place] Diagnostic Aphasia Examination: Complex Ideational Material subtest, Repetition of Words subtest and Repeating Phrases subtest . Controlled Oral Word Association Test (COWAT) . Trail Making Test A and B . Clock drawing test . [employer] [facility]) . Beck Depression Inventory Second Edition (BDI-II) . Generalized Anxiety Disorder - 7 item screener (GAD-7)  Test Results: Note: Standardized scores are presented only for use by appropriately trained professionals and to allow for any future test-retest comparison. These scores should not be interpreted without consideration of all the information that is contained in the rest of the report. The most recent standardization samples from the test publisher or other sources were used whenever possible to derive standard scores; scores were corrected for age, gender, ethnicity and education when available.   Test Scores:  Test Name Raw Score Standardized Score Descriptor  TOPF 58/70 SS= 115 High average  WAIS-IV Subtests     Similarities 33/36 ss= 15 Superior  Block Design 35/66 ss= 10 Average  Coding 66/135 ss= 11 Average  Digit Span Forward 12/16 ss= 12 High average  Digit Span Backward 6/16 ss= 7 Low average  WMS-IV Subtests     LM I 30/50 ss= 12 High average  LM II 26/50 ss= 12 High average  LM II Recognition 27/30 Cum %: >75 Above average  RBANS Subtests     Figure Copy 16/20 Z= -1.2 Low average  Figure Recall 13/20 Z= -0.2 Average  Semantic Fluency 21/40 Z= 0 Average  CVLT-II Scores     Trial 1 3/16 Z= -2 Impaired  Trial 5 13/16 Z= 0.5 Average  Trials 1-5 total 46/80 T= 50 Average   SD Free Recall 10/16 Z= 0 Average  SD Cued Recall 14/16 Z= 1 High average  LD Free Recall 10/16 Z= 0 Average  LD Cued Recall 14/16 Z= 1 High average  Recognition Discriminability 15/16 hits, 0 false positives Z= 1 High average    Forced Choice Recognition 16/16  WNL  NAB Naming 31/31 T= 55 Average  BDAE Subtests     Complex Ideational Material 12/12  WNL  Repetition of Words 10/10  WNL  Repeating Phrases 5/5; 5/5  WNL  COWAT-FAS 45 T= 52 Average  COWAT-Animals 21 T= 57 High average  Trail Making  Test A  38" 0 errors T= 48 Average  Trail Making Test B  61" 0 errors T= 55 Average  Clock Drawing   WNL  WCST     Total Errors 17 T= 47 Average  Perseverative Responses 5 T= 64 Superior  Perseverative Errors 5 T= 64 Superior  Conceptual Level Responses 39 T= 43 Average  Categories Completed 3 >16% WNL  Trials to Complete 1st Category 20 11-16%   Failure to Maintain Set 0    BDI-II 11/63  WNL  GAD-7 8/21  Mild      Description of Test Results:  Premorbid verbal intellectual abilities were estimated to have been within the high average range based on a test of word reading. Psychomotor processing speed was average. Basic auditory attention was high average, while more complex auditory attention (ie working memory) was low average. Visual-spatial construction was average to low average. Language abilities were within normal limits. Specifically, confrontation naming was intact with 100% accuracy, and semantic verbal fluency was average to high average. Auditory comprehension of complex ideational material was intact. Repetition of words and phrases was intact. With regard to verbal memory, encoding and acquisition of non-contextual information (i.e., word list) was average across five learning trials. After a brief interference task, free recall was average (10/16 items recalled). With semantic cueing, her recall improved to the high average range (14/16 items recalled). After a delay, free recall was average (10/16 items recalled). Cued recall was again high average (14/16 items recalled). Performance on a yes/no recognition task was high average. On another verbal memory test, encoding and acquisition of contextual  auditory information (i.e., short stories) was high average. After a delay, free recall was high average. Performance on a yes/no recognition task was above average. With regard to non-verbal memory, delayed free recall of visual information was average. Executive functioning was intact overall. Mental flexibility and set-shifting were average on Trails B. Verbal fluency with phonemic search restrictions was average. Verbal abstract reasoning was superior. Deductive reasoning and problem solving were superior. Performance on a clock drawing task was intact. On a self-report measure of mood, the patient's responses were not indicative of clinically significant depression although she did endorse some symptoms (e.g., mild pessimism, loss of self-confidence, restlessness, indecisiveness, reduced energy, reduced sleep, irritability, reduced appetite, fatigue and reduced libido). She denied suicidal ideation or intention. On a self-report measure of anxiety, the patient endorsed mild generalized anxiety characterized by excessive worrying, difficulty relaxing, nervousness, restlessness, irritability, and fear of something awful happening.    Clinical Impressions: No cognitive impairment appreciated on formal testing. Mild adjustment related depression/anxiety.  Results of cognitive testing were entirely within normal limits and commensurate with estimated premorbid intellectual abilities. Specifically, processing speed, auditory attention, language, visual spatial skills, learning and memory, and executive functioning were all normal, with nearly all scores falling in the average to superior range. There were no impaired performances on testing. Relative strengths were noted in aspects of executive functioning (abstract reasoning, deductive reasoning), learning/memory for contextual information, language and basic attention. Relative weaknesses (although not impaired) were noted in initial encoding of non-contextual  information and complex attention/working memory. There is no evidence from this evaluation to suggest the presence of a neurocognitive disorder, including MCI or dementia, at this time. Fortunately, the acute episode of language impairment has not recurred, and there was no indication of persisting language problems on testing, as well as no indication of underlying degenerative dementia.  There is also no evidence of a primary  psychiatric disorder, although she does appear to be experiencing some mild adjustment-related depression/anxiety symptoms.   Recommendations/Plan: Based on the findings of the present evaluation, the following recommendations are offered:  --The patient was reassured that her testing results are entirely within normal limits and not indicative of any underlying cognitive disorder or dementia. I reviewed strategies to maintain brain health, including cardiovascular exercise, mental stimulation and social engagement. She will be starting use of a CPAP soon for mild sleep apnea, which may improve sleep and daily cognitive function. --These results will serve as a nice baseline for future comparison if ever needed.   Feedback to Patient: [name] [name] returned for a feedback appointment on 01/22/2017 to review the results of her neuropsychological evaluation with this provider. 25 minutes face-to-face time was spent reviewing her test results, my impressions and my recommendations as detailed above.    Total time spent on this patient's case: 90791x1 unit for interview with psychologist; [phone] units of testing by psychometrician under psychologist's supervision; [phone] units for medical record review, scoring of neuropsychological tests, interpretation of test results, preparation of this report, and review of results to the patient by psychologist.      Thank you for your referral of [name] [name]. Please feel free to contact me if you have any questions or  concerns regarding this report.

## 2017-01-22 ENCOUNTER — Encounter: Payer: Medicare Other | Admitting: Psychology

## 2017-01-22 ENCOUNTER — Encounter: Payer: Self-pay | Admitting: Psychology

## 2017-01-22 ENCOUNTER — Ambulatory Visit (INDEPENDENT_AMBULATORY_CARE_PROVIDER_SITE_OTHER): Payer: Medicare Other | Admitting: Psychology

## 2017-01-22 DIAGNOSIS — R413 Other amnesia: Secondary | ICD-10-CM

## 2017-01-22 NOTE — Patient Instructions (Signed)
Clinical Impressions: No cognitive impairment appreciated on formal testing.  Results of cognitive testing were entirely within normal limits and commensurate with estimated premorbid intellectual abilities. Specifically, processing speed, auditory attention, language, visual spatial skills, learning and memory, and executive functioning were all normal, with nearly all scores falling in the average to superior range. There were no impaired performances on testing. Relative strengths were noted in aspects of executive functioning (abstract reasoning, deductive reasoning), learning/memory for contextual information, language and basic attention.  There is no evidence from this evaluation to suggest the presence of a neurocognitive disorder, including MCI or dementia, at this time. Fortunately, the acute episode of language impairment has not recurred, and there was no indication of persisting language problems on testing, as well as no indication of underlying degenerative dementia.  There is also no evidence of a primary psychiatric disorder, although she does appear to be experiencing some mild adjustment-related depression/anxiety symptoms.   Recommendations/Plan: Based on the findings of the present evaluation, the following recommendations are offered:  --The patient was reassured that her testing results are entirely within normal limits and not indicative of any underlying cognitive disorder or dementia. I reviewed strategies to maintain brain health, including cardiovascular exercise, mental stimulation and social engagement. She will be starting use of a CPAP soon for mild sleep apnea, which may improve sleep and daily cognitive function. --These results will serve as a nice baseline for future comparison if ever needed.

## 2017-01-24 ENCOUNTER — Telehealth: Payer: Self-pay | Admitting: *Deleted

## 2017-01-24 NOTE — Telephone Encounter (Signed)
Patient wants to use [facility] for her DME. Informed patient of titration results and verbalized understanding was indicated. Patient understands she will be contacted by [facility] to set up her cpap. She understands to call if [facility] does not contact her with new setup in a timely manner. She understands she will be called once confirmation has been received from [facility] that she has received her new machine to schedule 10 week follow up appointment.  [place] [facility] notified of new cpap order Please add to [name] [name] She was grateful for the call and thanked me

## 2017-01-24 NOTE — Telephone Encounter (Signed)
-----   Message from Sueanne Margarita, MD sent at 01/20/2017 12:47 AM EDT ----- Pt had successful PAP titration. Please setup appointment in 10 weeks. Please let [facility] know that order for PAP is in [facility].

## 2017-01-28 NOTE — Telephone Encounter (Signed)
Sleep study, office notes, and CPAP order has been sent to [facility] for the patient.

## 2017-01-29 ENCOUNTER — Telehealth (HOSPITAL_COMMUNITY): Payer: Self-pay | Admitting: *Deleted

## 2017-01-29 ENCOUNTER — Encounter: Payer: Medicare Other | Admitting: Psychology

## 2017-01-29 NOTE — Telephone Encounter (Signed)
[facility] called requesting office note where Dr. Aundra Dubin referred patient for sleep study.  Office note from February 27th, 2018 faxed today to [phone]

## 2017-02-01 ENCOUNTER — Other Ambulatory Visit: Payer: Self-pay

## 2017-02-01 DIAGNOSIS — D509 Iron deficiency anemia, unspecified: Secondary | ICD-10-CM

## 2017-02-01 MED ORDER — HEMATINIC PLUS COMPLEX 106-1 MG PO TABS: 1.0000 | ORAL_TABLET | ORAL | 0 refills | Status: DC

## 2017-02-02 ENCOUNTER — Encounter (HOSPITAL_COMMUNITY): Payer: Self-pay

## 2017-02-02 ENCOUNTER — Emergency Department (HOSPITAL_COMMUNITY)
Admission: EM | Admit: 2017-02-02 | Discharge: 2017-02-02 | Disposition: A | Discharge status: Home / Self Care | Payer: Medicare Other | Attending: Emergency Medicine | Admitting: Emergency Medicine

## 2017-02-02 ENCOUNTER — Telehealth: Payer: Self-pay | Admitting: Physician Assistant

## 2017-02-02 DIAGNOSIS — C8190 Hodgkin lymphoma, unspecified, unspecified site: Secondary | ICD-10-CM | POA: Insufficient documentation

## 2017-02-02 DIAGNOSIS — T82838A Hemorrhage of vascular prosthetic devices, implants and grafts, initial encounter: Secondary | ICD-10-CM | POA: Diagnosis present

## 2017-02-02 DIAGNOSIS — I130 Hypertensive heart and chronic kidney disease with heart failure and stage 1 through stage 4 chronic kidney disease, or unspecified chronic kidney disease: Secondary | ICD-10-CM | POA: Insufficient documentation

## 2017-02-02 DIAGNOSIS — Z79899 Other long term (current) drug therapy: Secondary | ICD-10-CM | POA: Diagnosis not present

## 2017-02-02 DIAGNOSIS — E1122 Type 2 diabetes mellitus with diabetic chronic kidney disease: Secondary | ICD-10-CM | POA: Diagnosis not present

## 2017-02-02 DIAGNOSIS — N182 Chronic kidney disease, stage 2 (mild): Secondary | ICD-10-CM | POA: Insufficient documentation

## 2017-02-02 DIAGNOSIS — Z905 Acquired absence of kidney: Secondary | ICD-10-CM | POA: Diagnosis not present

## 2017-02-02 DIAGNOSIS — D696 Thrombocytopenia, unspecified: Secondary | ICD-10-CM | POA: Insufficient documentation

## 2017-02-02 DIAGNOSIS — Z794 Long term (current) use of insulin: Secondary | ICD-10-CM | POA: Insufficient documentation

## 2017-02-02 DIAGNOSIS — I5032 Chronic diastolic (congestive) heart failure: Secondary | ICD-10-CM | POA: Insufficient documentation

## 2017-02-02 DIAGNOSIS — Z7902 Long term (current) use of antithrombotics/antiplatelets: Secondary | ICD-10-CM | POA: Diagnosis not present

## 2017-02-02 DIAGNOSIS — F419 Anxiety disorder, unspecified: Secondary | ICD-10-CM | POA: Diagnosis not present

## 2017-02-02 DIAGNOSIS — Z950 Presence of cardiac pacemaker: Secondary | ICD-10-CM | POA: Diagnosis not present

## 2017-02-02 DIAGNOSIS — Y69 Unspecified misadventure during surgical and medical care: Secondary | ICD-10-CM | POA: Diagnosis not present

## 2017-02-02 DIAGNOSIS — Z91040 Latex allergy status: Secondary | ICD-10-CM | POA: Insufficient documentation

## 2017-02-02 DIAGNOSIS — I2510 Atherosclerotic heart disease of native coronary artery without angina pectoris: Secondary | ICD-10-CM | POA: Diagnosis not present

## 2017-02-02 DIAGNOSIS — L7682 Other postprocedural complications of skin and subcutaneous tissue: Secondary | ICD-10-CM

## 2017-02-02 LAB — COMPREHENSIVE METABOLIC PANEL
ALBUMIN: 4.8 g/dL (ref 3.5–5.0)
ALT: 44 U/L (ref 14–54)
ANION GAP: 10 (ref 5–15)
AST: 58 U/L — ABNORMAL HIGH (ref 15–41)
Alkaline Phosphatase: 192 U/L — ABNORMAL HIGH (ref 38–126)
BILIRUBIN TOTAL: 1 mg/dL (ref 0.3–1.2)
BUN: 26 mg/dL — ABNORMAL HIGH (ref 6–20)
CALCIUM: 10.2 mg/dL (ref 8.9–10.3)
CO2: 29 mmol/L (ref 22–32)
Chloride: 101 mmol/L (ref 101–111)
Creatinine, Ser: 1.23 mg/dL — ABNORMAL HIGH (ref 0.44–1.00)
GFR calc non Af Amer: 46 mL/min — ABNORMAL LOW (ref >60)
GFR, EST AFRICAN AMERICAN: 53 mL/min — AB (ref >60)
GLUCOSE: 173 mg/dL — AB (ref 65–99)
Potassium: 4 mmol/L (ref 3.5–5.1)
Sodium: 140 mmol/L (ref 135–145)
TOTAL PROTEIN: 8.4 g/dL — AB (ref 6.5–8.1)

## 2017-02-02 LAB — CBC
HEMATOCRIT: 40.7 % (ref 36.0–46.0)
HEMOGLOBIN: 13.3 g/dL (ref 12.0–15.0)
MCH: 29.2 pg (ref 26.0–34.0)
MCHC: 32.7 g/dL (ref 30.0–36.0)
MCV: 89.5 fL (ref 78.0–100.0)
Platelets: 120 10*3/uL — ABNORMAL LOW (ref 150–400)
RBC: 4.55 MIL/uL (ref 3.87–5.11)
RDW: 14.6 % (ref 11.5–15.5)
WBC: 6.9 10*3/uL (ref 4.0–10.5)

## 2017-02-02 NOTE — ED Notes (Signed)
Pt ambulatory to restroom with steady gait.

## 2017-02-02 NOTE — ED Triage Notes (Signed)
Pt presents to the ed with complaints of the site of her continuous blood glucose sensor bleeding. She is on Brilinta and was told to take it out because it was bleeding. The site has not stopped bleeding since last night. It is a small small puncture wound that has a continuous ooze, this rn redressed wound with a pressure dressing, no bleeding through is present.  Pt is alert oriented with no complaints of any symptoms. Vs wnl. edp notified for lab orders due to pt having history of low platelets

## 2017-02-02 NOTE — Telephone Encounter (Signed)
Called with almost 24 hours of bleeding @ glucose monitor stick. Pt has type I DM and pump. No improvement with removing pump, pressure, raising change. Hx of severe CAD with redo CABG, Prior GI bleed and thrombocytopenia. Last platelets was 98 on 12/17/16. Advised to go to for lab work or might need cauterization. She is no Brillinta 43m BID and ASA [id number]. Might need to hold for few day. No prior hx of similar significant bleeding.

## 2017-02-02 NOTE — ED Notes (Signed)
EDP at bedside  

## 2017-02-02 NOTE — Discharge Instructions (Signed)
It was our pleasure to provide your ER care today - we hope that you feel better.  If bleeding recurs, hold direct pressure, without letting up to check if still bleeding, for 10 minutes - if bleeding persists, return for recheck.   For low platelet count, follow up with your doctor in the next 1-2 weeks.

## 2017-02-02 NOTE — Telephone Encounter (Signed)
Please call Monday to see if she is still having trouble with bleeding.  May need CBC if so.

## 2017-02-02 NOTE — ED Provider Notes (Signed)
[facility] DEPT Provider Note   CSN: [id number] Arrival date & time: 02/02/17  1508     History   Chief Complaint Chief Complaint  Patient presents with  . Coagulation Disorder    HPI ALPA SALVO is a 66 y.o. female.  Patient indicates area where recent cbg sensor has persistently oozed blood since yesterday. Is on brilinta and asa, no other anticoag use. Hx low platelets. Denies other recent abnormal bruising or bleeding. No abd pain. No fevers. No fainting/syncope. Symptoms mild, persistent, transiently better w pressure.    The history is provided by the patient.    Past Medical History:  Diagnosis Date  . Anxiety   . Aortic stenosis    a. bicuspid aortic valve; mean gradient of 20 mmHg in 2/10; b. s/p bioprosth AVR (19-mm Edwards pericardial valve) with a redo coronary artery bypass graft procedure in 07/2009; postoperative Dressler's syndrome    . Arthritis   . Carcinoma, renal cell ([place]) 05/2007   Laparoscopic right nephrectomy  . Chronic diastolic CHF (congestive heart failure) ([place])    a. 9.2014 EchoP EF 60-65%, no rwma, bioprosth AVR, mean gradient of 52mHg, mild to mod MR, PASP 588mg.  . CKD (chronic kidney disease), stage II   . Colitis, ischemic (H[place]2008  . Coronary artery disease    a. initial CABG with SVG-RCA in 12/05. b. redo SVG-RCA and bioprosthetic AVR in 1/11. d. 9/14 - PCI with DES to distal LM/proximal LAD was done rather than bypass as she would have been a poor candidate for redo sternotomy. d. S/p DES to prox LM 05/2014.  . Diabetes mellitus 03/2010    TYPE II: Hemoglobin A1c of 7.4 in  03/2010; 8.4 in 06/2010; treated with insulin pump  . Duodenal ulcer    Remote; H. pylori positive  . Gastroparesis due to DM (HCTur[place]10/04/2012  . Genetic testing 01/14/2017   Ms. Hay underwent genetic counseling and testing for hereditary cancer syndromes on 01/01/2017. Her results were negative for mutations in all 83 genes analyzed by [facility]'s  83-gene Common Hereditary Cancers Panel. Genes analyzed include: ALK, APC, ATM, AXIN2, BAP1, BARD1, BLM, BMPR1A, BRCA1, BRCA2, BRIP1, CASR, CDC73, CDH1, CDK4, CDKN1B, CDKN1C, CDKN2A, CEBPA, CHEK2, CTNNA1, DICER1, DIS3L2,   . Hodgkin's disease (HCH[place]neville1991   [place] radiation therapy  . Hyperlipidemia   . Hyperplastic gastric polyp 06/23/2012  . Hypertension   . Iron deficiency anemia    a. 03/2013 EGD: essentially normal. Possible slow GIB.  . Migraines   . NASH (nonalcoholic steatohepatitis) 1999   -biopsy in 1999  . Osteopenia     hip on DEXA in October 2007.  . Osteoporosis   . PAF (paroxysmal atrial fibrillation) ([place]   a.  h/o brief episodes noted on ppm interrogation. b. given GI bleeding and need to be on both ASA 81 and Brilinta, risks likely outweigh benefits of anticoagulation.   . Small bowel obstruction (HCC)    Recurrent; resolved after resection of a lipoma  . Syncope    a. H/o recurrent syncope with pauses on loop recorder s/p Medtronic pacemaker 07/2012.    Patient Active Problem List   Diagnosis Date Noted  . Genetic testing 01/14/2017  . S/P left mastectomy 07/29/2016  . Dense breast 12/11/2015  . Hx of iron deficiency anemia 12/11/2015  . Chronic anticoagulation 12/11/2015  . Breast cancer screening, high risk patient 12/11/2015  . Type 1 diabetes mellitus (HCBrisbin04/27/2017  . 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  Hx of radiation therapy 04/23/2015  . Thrombocytopenia ([place]) 04/23/2015  . 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 Osteopenia determined by x-ray 04/23/2015  . DM (diabetes mellitus) with complications ([place]) 40/98/1191  . Radiation therapy complication 47/82/9562  . History of nodular sclerosis Hodgkin's disease 10/27/2014  . Iron deficiency anemia due to chronic blood loss 10/27/2014  . Screening mammogram for high-risk patient 10/27/2014  . Insulin  dependent type 1 diabetes mellitus ([place]) 10/07/2014  . Diabetic gastroparesis ([place]) 07/30/2014  . Claudication ([place]) 07/29/2014  . PAF (paroxysmal atrial fibrillation) ([place])   . CKD (chronic kidney disease), stage II   . Coronary artery disease involving native coronary artery of native heart with unstable angina pectoris ([place])   . Type 2 diabetes mellitus with complication ([place])   . Abdominal bloating 06/09/2013  . Feeling bilious 06/09/2013  . Iron deficiency anemia 04/11/2013  . Unstable angina ([place]) 04/11/2013  . CAD (coronary artery disease) of artery bypass graft 04/03/2013  . Chronic diastolic CHF (congestive heart failure) ([place]) 04/03/2013  . Anemia 04/03/2013  . Arterial vascular disease 03/12/2013  . Aorta disorder ([place]) 03/12/2013  . Atrial fibrillation ([place]) 03/12/2013  . Artificial cardiac pacemaker 03/12/2013  . CCF (congestive cardiac failure) ([place]) 03/12/2013  . Chronic kidney disease (CKD), stage III (moderate) 03/12/2013  . Gastric reflux 03/12/2013  . 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 Big thyroid 03/12/2013  . H/O Hodgkin's lymphoma 03/12/2013  . BP (high blood pressure) 03/12/2013  . Fitting and adjustment of insulin pump 03/12/2013  . Disorder of bone and cartilage, unspecified 03/12/2013  . Aortic heart valve narrowing 03/12/2013  . Encounter for fitting or adjustment of insulin pump 03/12/2013  . Congestive heart failure ([place]) 03/12/2013  . Ischemic colitis ([place]) 03/12/2013  . Lipoma of neck 09/23/2012  . Pacemaker 09/08/2012  . Chronic kidney disease, stage 3/cardiorenal syndrome 08/15/2012  . Atrial flutter ([place]) 07/23/2012  . Sick sinus syndrome ([place]) 07/21/2012  . Hyperplastic gastric polyp 06/23/2012  . Gastroparesis due to DM ([place]) 05/01/2012  . Hypercalcemia   . Cholelithiasis 07/20/2011  . Aortic stenosis status post 19 mm Edwards pericardial valve replacement 2011   . Arteriosclerotic cardiovascular disease (ASCVD)   . Osteopenia   . Anemia,  normocytic normochromic   . NASH (nonalcoholic steatohepatitis)   . Renal cell carcinoma (HCC)     Class: History of  . Duodenal ulcer     Class: History of  . Ischemic colitis     Class: History of  . Gastroesophageal reflux disease   . Hodgkin's disease ([place])   . Hypertension   . Small bowel obstruction ([place])   . Hyperlipidemia 04/11/2010  . Diabetes mellitus type 2, controlled ([place]) 03/23/2010  . Colonic polyps, adenomatous 09/29/2009    Past Surgical History:  Procedure Laterality Date  . AORTIC VALVE REPLACEMENT  2011   Aortic valve replacement surgery with a 19 mm bioprosthetic device post redo CABG January 2011.  Marland Kitchen BONE MARROW BIOPSY    . BREAST EXCISIONAL BIOPSY     benign, bilateral  . CERVICAL BIOPSY    . COLONOSCOPY     Multiple with adenomatous polyps  . COLONOSCOPY  06/23/2012   Procedure: COLONOSCOPY;  Surgeon: Gatha Mayer, MD;  Location: [facility] ENDOSCOPY;  Service: Endoscopy;  Laterality: N/A;  . CORONARY ARTERY BYPASS GRAFT  2005, 2011   CABG-most recent SVG to RCA-12/05; RCA occluded with patent graft in 2006;  Redo bypass in 07/2009  . ESOPHAGOGASTRODUODENOSCOPY    . ESOPHAGOGASTRODUODENOSCOPY  06/23/2012   Procedure: ESOPHAGOGASTRODUODENOSCOPY (EGD);  Surgeon: Gatha Mayer, MD;  Location: [facility];  Service: Endoscopy;  Laterality: N/A;  . ESOPHAGOGASTRODUODENOSCOPY N/A 04/06/2013   Procedure: ESOPHAGOGASTRODUODENOSCOPY (EGD);  Surgeon: Irene Shipper, MD;  Location[facility] ENDOSCOPY;  Service: Endoscopy;  Laterality: N/A;  . EXPLORATORY LAPAROTOMY W/ BOWEL RESECTION     Small bowel resection of lipoma  . GIVENS CAPSULE STUDY N/A 04/30/2013   Procedure: GIVENS CAPSULE STUDY;  Surgeon: Gatha Mayer, MD;  Location: [facility];  Service: Endoscopy;  Laterality: N/A;  . LEFT AND RIGHT HEART CATHETERIZATION WITH CORONARY ANGIOGRAM N/A 04/07/2013   Procedure: LEFT AND RIGHT HEART CATHETERIZATION WITH CORONARY ANGIOGRAM;  Surgeon: Larey Dresser, MD;  Location[facility]  CATH LAB;  Service: Cardiovascular;  Laterality: N/A;  . LEFT ATRIAL APPENDAGE OCCLUSION N/A 02/16/2016   Watchman not placed - nickel allergy  . LEFT HEART CATHETERIZATION WITH CORONARY/GRAFT ANGIOGRAM N/A 06/07/2014   Procedure: LEFT HEART CATHETERIZATION WITH Beatrix Fetters;  Surgeon: Burnell Blanks, MD;  Location[facility] LAB;  Service: Cardiovascular;  Laterality: N/A;  . LIVER BIOPSY    . LOOP RECORDER IMPLANT N/A 12/10/2011   Procedure: LOOP RECORDER IMPLANT;  Surgeon: Evans Lance, MD;  Location: [facility] CATH LAB;  Service: Cardiovascular;  Laterality: N/A;  . MALONEY DILATION  06/23/2012   Procedure: [name] DILATION;  Surgeon: Gatha Mayer, MD;  Location: [facility] ENDOSCOPY;  Service: Endoscopy;;  54 fr  . MASTECTOMY Left 05/17/2015   total   . NEPHRECTOMY  2008   Laparoscopic right nephrectomy, renal cell carcinoma  . PACEMAKER INSERTION  08/21/2012   Medtronic Adapta L dual-chamber pacemaker  . PERCUTANEOUS CORONARY STENT INTERVENTION (PCI-S) N/A 04/09/2013   Procedure: PERCUTANEOUS CORONARY STENT INTERVENTION (PCI-S);  Surgeon: Burnell Blanks, MD;  Location: [facility] CATH LAB;  Service: Cardiovascular;  Laterality: N/A;  . PERMANENT PACEMAKER INSERTION N/A 08/21/2012   Procedure: PERMANENT PACEMAKER INSERTION;  Surgeon: Evans Lance, MD;  Location: [facility] CATH LAB;  Service: Cardiovascular;  Laterality: N/A;  . TEE WITHOUT CARDIOVERSION N/A 12/07/2015   Procedure: TRANSESOPHAGEAL ECHOCARDIOGRAM (TEE);  Surgeon: Larey Dresser, MD;  Location: [facility];  Service: Cardiovascular;  Laterality: N/A;  . TOTAL ABDOMINAL HYSTERECTOMY W/ BILATERAL SALPINGOOPHORECTOMY  2000  . TOTAL MASTECTOMY Left 05/17/2015   Procedure: LEFT TOTAL MASTECTOMY;  Surgeon: Rolm Bookbinder, MD;  Location: [facility];  Service: General;  Laterality: Left;  . TUMOR EXCISION  1981   removal of Hodgkins lymphoma    OB History    No data available       Home Medications    Prior to Admission  medications   Medication Sig Start Date End Date Taking? Authorizing Provider  Alirocumab (PRALUENT [place]) Inject 150 mLs into the skin every 14 (fourteen) days.    [provider]  ALPRAZolam Duanne Moron) 0.5 MG tablet Take 0.5 mg by mouth at bedtime. May take additional dose daily for anxiety    [provider]  amLODipine (NORVASC) 5 MG tablet Take 1 tablet (5 mg total) by mouth daily. 05/14/16   Larey Dresser, MD  APIDRA 100 UNIT/ML injection Inject 46 Units into the skin as directed. INFUSE THROUGH INSULIN PUMP UTD::[place] 46.15 units daily, Bolus depends on carb intake 02/04/15   [provider]  aspirin EC 81 MG tablet Take 81 mg by mouth at bedtime.    [provider]  azithromycin (ZITHROMAX) 500  MG tablet 1 tablet by mouth one hour prior to dental appointment/procedure Patient not taking: Reported on 12/27/2016 12/21/13   Larey Dresser, MD  BRILINTA 60 MG TABS tablet TAKE 1 TABLET(60 MG) BY MOUTH TWICE DAILY 06/28/16   Larey Dresser, MD  Calcium Carbonate-Vitamin D (RA CALCIUM PLUS VITAMIN D) 600-400 MG-UNIT per tablet Take 1 tablet by mouth daily.     [provider]  Cholecalciferol 2000 UNITS TABS Take 1 tablet by mouth every morning.     [provider]  clobetasol (TEMOVATE) 0.05 % external solution Apply 1 application topically daily as needed (for hives). Reported on 12/05/2015    [provider]  Coenzyme Q10 (CO Q 10 PO) Take 1 tablet by mouth at bedtime.    [provider]  colchicine 0.6 MG tablet Take 0.6 mg by mouth at bedtime. And,Take 1 tablet at onset and repeate in 1 hour and then 1 daily x 2 (FOR FLARES)     [provider]  doxycycline (VIBRAMYCIN) 100 MG capsule Take 1 capsule (100 mg total) by mouth 2 (two) times daily. 12/17/16   Rancour, Annie Main, MD  Fe Fum-FA-B Cmp-C-Zn-Mg-Mn-Cu (HEMATINIC PLUS COMPLEX) 106-1 MG TABS Take 1 tablet by mouth every Monday, Wednesday, and Friday. Take 1 tablet oraly  three times a week or as directed daily with with a Vitamin C tablet 02/01/17   Nicholas Lose, MD  glucagon (GLUCAGON EMERGENCY) 1 MG injection Inject 1 mg into the vein once as needed (for severe reaction). Reported on 12/05/2015    [provider]  isosorbide mononitrate (IMDUR) 60 MG 24 hr tablet TAKE 1 TABLET BY MOUTH ONCE DAILY 07/24/16   Larey Dresser, MD  latanoprost (XALATAN) 0.005 % ophthalmic solution Place 1 drop into both eyes at bedtime.    [provider]  Magnesium Oxide 250 MG TABS Take 250 mg by mouth at bedtime.     [provider]  metoprolol (LOPRESSOR) 50 MG tablet TAKE 1 AND 1/2 TABLETS(75 MG) BY MOUTH TWICE DAILY 02/23/16   Evans Lance, MD  Multiple Vitamins-Minerals (MULTIVITAMIN WITH MINERALS) tablet Take 1 tablet by mouth every morning.     [provider]  nitroGLYCERIN (NITROLINGUAL) 0.4 MG/SPRAY spray Place 1 spray under the tongue every 5 (five) minutes as needed for chest pain. Patient not taking: Reported on 12/27/2016 05/14/16   Larey Dresser, MD  Omega-3 Fatty Acids (FISH OIL) 1000 MG CAPS Take 1,000 mg by mouth at bedtime.     [provider]  omeprazole (PRILOSEC) 20 MG capsule Take 20 mg by mouth every other day. TAKES IN THE EVENING    [provider]  potassium chloride (K-DUR,KLOR-CON) 10 MEQ tablet Take 10 mEq by mouth as directed. Take 6 tablets in the AM and 4 tablets in the PM    [provider]  pravastatin (PRAVACHOL) 10 MG tablet TAKE 1 TABLET BY MOUTH ONCE DAILY 03/29/16   Larey Dresser, MD  torsemide (DEMADEX) 20 MG tablet Take 60 mg (3 Tablets) in the AM and 40 mg (2 Tablets) in the PM. 01/03/17   Larey Dresser, MD  triamcinolone cream (KENALOG) 0.5 % Apply 1 application topically 3 (three) times daily.    [provider]  ULORIC 40 MG tablet Take 40 mg by mouth at bedtime.  03/15/16   [provider]    Family History Family History  Problem Relation Age of Onset   . Heart attack Mother  d.42  . Heart disease Mother   . Heart attack Father        d.60  . Diabetes Father   . Heart disease Father   . Lung cancer Sister        d.61 metastases to brain. History of smoking. Maternal half-sister.  . Cancer Sister 30       unspecified gynecologic cancer (either cervical or ovarian) in her 53s.  . Cancer Maternal Grandfather 50       d.52s oral cancer  . Hypertension Brother   . Glaucoma Brother   . Breast cancer Sister 63       d.42 from metastatic disease. Paternal half-sister.  . Colon cancer Neg Hx   . Stomach cancer Neg Hx     Social History Social History  Substance Use Topics  . Smoking status: Never Smoker  . Smokeless tobacco: Never Used  . Alcohol use No     Allergies   Avandia [rosiglitazone maleate]; Cephalexin; Clindamycin hcl; Lincomycin; Metformin; Novolog [insulin aspart]; Penicillins; Tizanidine; Adhesive [tape]; Atorvastatin; Cholestyramine; Crestor [rosuvastatin]; Gentamycin [gentamicin]; Humalog [insulin lispro]; Iodinated diagnostic agents; Limonene; Pravastatin; Prednisone; Simvastatin; Strawberry extract; Plavix [clopidogrel bisulfate]; Codeine; Erythromycin; Erythromycin base; Hydrocodone-acetaminophen; Latex; Neomycin; Nickel; Ranitidine; Tamiflu [oseltamivir]; and Tramadol   Review of Systems Review of Systems  Constitutional: Negative for fever.  HENT: Negative for nosebleeds.   Gastrointestinal: Negative for blood in stool.  Skin: Negative for rash.  Neurological: Negative for syncope.  Hematological:       No other abn bruising or bleeding. Hx low plt.      Physical Exam Updated Vital Signs BP (!) 126/56   Pulse 73   Temp 97.9 F (36.6 C) (Oral)   Resp 18   Ht 1.6 m (5' 3" )   Wt 52.2 kg (115 lb)   SpO2 98%   BMI 20.37 kg/m   Physical Exam  Constitutional: She appears well-developed and well-nourished. No distress.  HENT:  Head: Atraumatic.  Eyes: Conjunctivae are normal. No scleral  icterus.  Neck: No tracheal deviation present.  Cardiovascular: Normal rate.   Pulmonary/Chest: Effort normal. No respiratory distress.  Abdominal: Normal appearance. There is no tenderness.  Prior cbg site right anterior abd w v slow ooze dark blood. No infection noted to area.   Musculoskeletal: She exhibits no edema.  Neurological: She is alert.  Skin: Skin is warm and dry. No rash noted. She is not diaphoretic.  No petechia. No abn bruising.   Psychiatric: She has a normal mood and affect.  Nursing note and vitals reviewed.    ED Treatments / Results  Labs (all labs ordered are listed, but only abnormal results are displayed) Results for orders placed or performed during the hospital encounter of 02/02/17  CBC  Result Value Ref Range   WBC 6.9 4.0 - 10.5 K/uL   RBC 4.55 3.87 - 5.11 MIL/uL   Hemoglobin 13.3 12.0 - 15.0 g/dL   HCT 40.7 36.0 - 46.0 %   MCV 89.5 78.0 - 100.0 fL   MCH 29.2 26.0 - 34.0 pg   MCHC 32.7 30.0 - 36.0 g/dL   RDW 14.6 11.5 - 15.5 %   Platelets 120 (L) 150 - 400 K/uL  Comprehensive metabolic panel  Result Value Ref Range   Sodium 140 135 - 145 mmol/L   Potassium 4.0 3.5 - 5.1 mmol/L   Chloride 101 101 - 111 mmol/L   CO2 29 22 - 32 mmol/L   Glucose, Bld 173 (H) 65 - 99  mg/dL   BUN 26 (H) 6 - 20 mg/dL   Creatinine, Ser 1.23 (H) 0.44 - 1.00 mg/dL   Calcium 10.2 8.9 - 10.3 mg/dL   Total Protein 8.4 (H) 6.5 - 8.1 g/dL   Albumin 4.8 3.5 - 5.0 g/dL   AST 58 (H) 15 - 41 U/L   ALT 44 14 - 54 U/L   Alkaline Phosphatase 192 (H) 38 - 126 U/L   Total Bilirubin 1.0 0.3 - 1.2 mg/dL   GFR calc non Af Amer 46 (L) >60 mL/min   GFR calc Af Amer 53 (L) >60 mL/min   Anion gap 10 5 - 15   *Note: Due to a large number of results and/or encounters for the requested time period, some results have not been displayed. A complete set of results can be found in Results Review.   No results found.  EKG  EKG Interpretation None       Radiology No results  found.  Procedures Procedures (including critical care time)  Medications Ordered in ED Medications - No data to display   Initial Impression / Assessment and Plan / ED Course  I have reviewed the triage vital signs and the nursing notes.  Pertinent labs & imaging results that were available during my care of the patient were reviewed by me and considered in my medical decision making (see chart for details).  After pressure applied, continued slow ooze.   Quick clot/sterile dressing applied.    Observed in ED for several minutes, no recurrent bleeding.  Pt appears stable for d/c.     Final Clinical Impressions(s) / ED Diagnoses   Final diagnoses:  None    New Prescriptions New Prescriptions   No medications on file     Lajean Saver, MD 02/02/17 1844

## 2017-02-04 ENCOUNTER — Telehealth (HOSPITAL_COMMUNITY): Payer: Self-pay | Admitting: *Deleted

## 2017-02-04 ENCOUNTER — Other Ambulatory Visit: Payer: Self-pay | Admitting: Emergency Medicine

## 2017-02-04 DIAGNOSIS — D509 Iron deficiency anemia, unspecified: Secondary | ICD-10-CM

## 2017-02-04 DIAGNOSIS — I5032 Chronic diastolic (congestive) heart failure: Secondary | ICD-10-CM

## 2017-02-04 MED ORDER — HEMATINIC PLUS COMPLEX 106-1 MG PO TABS: 1.0000 | ORAL_TABLET | ORAL | 0 refills | Status: DC

## 2017-02-04 NOTE — Telephone Encounter (Signed)
Patient received her CPAP machine and is having some extreme dry mouth during the night.  Patient has been advised to reach out to her DME.  Patient has a 10 week follow up appointment scheduled for April 24 2017.  Patient understands this is her compliance appointment for insurance. Patient agrees with treatment and thanked me for the call.

## 2017-02-04 NOTE — Telephone Encounter (Signed)
Was seen and evaluated in ER 7/14

## 2017-02-04 NOTE — Telephone Encounter (Signed)
Please see messages below.  Patient called triage line and I have added her to our lab schedule for a CBC check.  She was advised from the ER to have her platelets checked with Dr. Aundra Dubin in 2 weeks. Lab order placed and appointment scheduled.     Call Documentation   Rose Fillers Marsh Dolly, RN at 02/04/2017 8:36 AM   Status: Signed     Was seen and evaluated in ER 7/14    Larey Dresser, MD at 02/02/2017 9:44 PM   Status: Signed    Please call Monday to see if she is still having trouble with bleeding.  May need CBC if so.     Leanor Kail, PA at 02/02/2017 1:39 PM   Status: Signed    Called with almost 24 hours of bleeding @ glucose monitor stick. Pt has type I DM and pump. No improvement with removing pump, pressure, raising change. Hx of severe CAD with redo CABG, Prior GI bleed and thrombocytopenia. Last platelets was 98 on 12/17/16. Advised to go to for lab work or might need cauterization. She is no Brillinta 22m BID and ASA [id number]. Might need to hold for few day. No prior hx of similar significant bleeding.

## 2017-02-06 NOTE — Telephone Encounter (Signed)
What mask is she using?

## 2017-02-07 NOTE — Telephone Encounter (Signed)
[employer] Respironics Dream Ware Full Face Mask (small),   She has a full face mask that goes under her nose and below her mouth, air hose fits on top of her head. Blows her cheeks out like chipmunk cheeks, and makes a trumpet sound.  [facility] turned humidity up to 5 ,Patient turned humidity up to 6 and states that helps a tiny bit.  Triangle shaped ResMed, (small) The mask at her titration fit across the bridge of her nose worked best but was painful to wear but did not leak but left an indention between her eyes and she had a headache in the mornings where it indented.

## 2017-02-07 NOTE — Telephone Encounter (Signed)
What is her CPAP pressure

## 2017-02-11 NOTE — Telephone Encounter (Signed)
Pressure set too high for nasal pillow mask  She will need to take her mask to her DME to evaluate problem and make recommendations

## 2017-02-11 NOTE — Telephone Encounter (Signed)
Patient states her CPAP pressure is set on 18cm.

## 2017-02-13 ENCOUNTER — Telehealth: Payer: Self-pay | Admitting: Cardiology

## 2017-02-13 ENCOUNTER — Encounter (HOSPITAL_COMMUNITY): Payer: Self-pay | Admitting: Cardiology

## 2017-02-13 NOTE — Telephone Encounter (Signed)
New message    Hassan Rowan, Case manager is calling for someone to call pt back. She states that pt is having trouble sleeping at night since getting her CPAP. She states that pt mask is tight, it's leaking and uncomfortable. She also complains of dry mouth. The pressure is set at 18. They want to know if the pressure can be turned down to 16 and if a nose piece can be ordered so she can wear it more comfortably.

## 2017-02-18 ENCOUNTER — Ambulatory Visit (HOSPITAL_COMMUNITY)
Admission: RE | Admit: 2017-02-18 | Discharge: 2017-02-18 | Disposition: A | Discharge status: Home / Self Care | Payer: Medicare Other | Source: Ambulatory Visit | Attending: Internal Medicine | Admitting: Internal Medicine

## 2017-02-18 ENCOUNTER — Ambulatory Visit (INDEPENDENT_AMBULATORY_CARE_PROVIDER_SITE_OTHER): Payer: Medicare Other | Admitting: *Deleted

## 2017-02-18 ENCOUNTER — Telehealth: Payer: Self-pay | Admitting: Cardiology

## 2017-02-18 DIAGNOSIS — I5032 Chronic diastolic (congestive) heart failure: Secondary | ICD-10-CM

## 2017-02-18 DIAGNOSIS — I495 Sick sinus syndrome: Secondary | ICD-10-CM

## 2017-02-18 LAB — CBC
HCT: 37.2 % (ref 36.0–46.0)
Hemoglobin: 12 g/dL (ref 12.0–15.0)
MCH: 28.8 pg (ref 26.0–34.0)
MCHC: 32.3 g/dL (ref 30.0–36.0)
MCV: 89.2 fL (ref 78.0–100.0)
PLATELETS: 110 10*3/uL — AB (ref 150–400)
RBC: 4.17 MIL/uL (ref 3.87–5.11)
RDW: 14.9 % (ref 11.5–15.5)
WBC: 6.8 10*3/uL (ref 4.0–10.5)

## 2017-02-18 NOTE — Progress Notes (Signed)
Remote pacemaker transmission.   

## 2017-02-18 NOTE — Telephone Encounter (Signed)
Spoke with pt and reminded pt of remote transmission that is due today. Pt verbalized understanding.   

## 2017-02-19 ENCOUNTER — Telehealth (HOSPITAL_COMMUNITY): Payer: Self-pay

## 2017-02-19 NOTE — Telephone Encounter (Signed)
Patient calling to report increased abd and BLEE and tightness over the past few days.  Weight only up 1-2 lbs at 118 lbs. Reports a little more SOB and fatigued. Denies cough, CP, dizziness, lightheadedness, or any other s/s. Advised per Jettie Booze NP to take extra torsemide tablet today and follow up with [name] in the morning. Patient to call clinic back if s/s worsen. Aware and agreeable to plan as stated baove.  Renee Pain, RN

## 2017-02-20 NOTE — Telephone Encounter (Signed)
Patient called and said her breathing has improved and the swelling in her ankles was much better.  Patient stated that her weight has only come down 2 oz and ask if she can take an extra torsemide again today.  Jettie Booze, NP said that would be fine. No further questions.

## 2017-02-21 LAB — CUP PACEART REMOTE DEVICE CHECK
Battery Impedance: 233 Ohm
Brady Statistic AP VS Percent: 0 %
Brady Statistic AS VS Percent: 100 %
Implantable Lead Location: 753859
Implantable Lead Model: 5076
Implantable Lead Model: 5076
Lead Channel Impedance Value: 460 Ohm
Lead Channel Pacing Threshold Amplitude: 0.5 V
Lead Channel Pacing Threshold Amplitude: 0.75 V
Lead Channel Pacing Threshold Pulse Width: 0.4 ms
Lead Channel Sensing Intrinsic Amplitude: 1.4 mV
Lead Channel Sensing Intrinsic Amplitude: 16 mV
Lead Channel Setting Pacing Amplitude: 1.5 V
Lead Channel Setting Pacing Pulse Width: 0.4 ms
MDC IDC LEAD IMPLANT DT: 20140130
MDC IDC LEAD IMPLANT DT: 20140130
MDC IDC LEAD LOCATION: 753860
MDC IDC MSMT BATTERY REMAINING LONGEVITY: 141 mo
MDC IDC MSMT BATTERY VOLTAGE: 2.8 V
MDC IDC MSMT LEADCHNL RA PACING THRESHOLD PULSEWIDTH: 0.4 ms
MDC IDC MSMT LEADCHNL RV IMPEDANCE VALUE: 625 Ohm
MDC IDC PG IMPLANT DT: 20140130
MDC IDC SESS DTM: 20180730162645
MDC IDC SET LEADCHNL RV PACING AMPLITUDE: 2.5 V
MDC IDC SET LEADCHNL RV SENSING SENSITIVITY: 5.6 mV
MDC IDC STAT BRADY AP VP PERCENT: 0 %
MDC IDC STAT BRADY AS VP PERCENT: 0 %

## 2017-02-22 ENCOUNTER — Encounter: Payer: Self-pay | Admitting: Cardiology

## 2017-02-25 NOTE — Telephone Encounter (Addendum)
Per Danae Chen at [facility] patient got a new mask on 02/15/17 and seems to be doing good with and wants to hold off on any pressure change at this time.  Hassan Rowan at [facility] sent a fax for patient's pressure change to be set on auto cpap. Dr Radford Pax has signed that paperwork and our office has have faxed it back. Confirmation of fax has been received from [place] ([place])                                   Patient's compliance range is 01/30/17-05/01/17. Patient has a 10 week appointment scheduled for 04/24/17 for insurance compliance.

## 2017-02-25 NOTE — Telephone Encounter (Signed)
Per Danae Chen at [facility] patient got a new mask on 02/15/17 and seems to be doing good with and wants to hold off on any pressure change at this time.  Patient's compliance range is 01/30/17-05/01/17.

## 2017-02-27 ENCOUNTER — Telehealth (HOSPITAL_COMMUNITY): Payer: Self-pay

## 2017-02-27 NOTE — Telephone Encounter (Signed)
Patient called to report very SOB this morning waking up, took off her CPAP and sat up and breathing slowly got better.  States she has a non productive cough and slight BLEE and feels this is similar to the situation she had last week and got better when we advised to increase her diuretics.  Per Oda Kilts PA-C, advised to take an extra torsemide tablet in the afternoon.  Usually takes 60 mg in am and 40 mg in pm.  Advised for 3 days to take 60 mg BID.  Will follow up with Dr. Aundra Dubin next Tuesday during her apt.  Advised if no better or symptoms worsen to call back to clinic Friday. Aware and agreeable.  Renee Pain, RN

## 2017-03-05 ENCOUNTER — Ambulatory Visit (HOSPITAL_COMMUNITY)
Admission: RE | Admit: 2017-03-05 | Discharge: 2017-03-05 | Disposition: A | Discharge status: Home / Self Care | Payer: Medicare Other | Source: Ambulatory Visit | Attending: Cardiology | Admitting: Cardiology

## 2017-03-05 ENCOUNTER — Encounter (HOSPITAL_COMMUNITY): Payer: Self-pay | Admitting: Cardiology

## 2017-03-05 VITALS — BP 126/58 | HR 85 | Wt 120.0 lb

## 2017-03-05 DIAGNOSIS — I5032 Chronic diastolic (congestive) heart failure: Secondary | ICD-10-CM | POA: Insufficient documentation

## 2017-03-05 DIAGNOSIS — I517 Cardiomegaly: Secondary | ICD-10-CM | POA: Insufficient documentation

## 2017-03-05 DIAGNOSIS — R188 Other ascites: Secondary | ICD-10-CM | POA: Diagnosis not present

## 2017-03-05 DIAGNOSIS — I480 Paroxysmal atrial fibrillation: Secondary | ICD-10-CM | POA: Diagnosis not present

## 2017-03-05 DIAGNOSIS — I2510 Atherosclerotic heart disease of native coronary artery without angina pectoris: Secondary | ICD-10-CM

## 2017-03-05 DIAGNOSIS — I440 Atrioventricular block, first degree: Secondary | ICD-10-CM | POA: Diagnosis not present

## 2017-03-05 LAB — BASIC METABOLIC PANEL
ANION GAP: 11 (ref 5–15)
BUN: 33 mg/dL — ABNORMAL HIGH (ref 6–20)
CALCIUM: 10.1 mg/dL (ref 8.9–10.3)
CHLORIDE: 101 mmol/L (ref 101–111)
CO2: 30 mmol/L (ref 22–32)
Creatinine, Ser: 1.17 mg/dL — ABNORMAL HIGH (ref 0.44–1.00)
GFR calc non Af Amer: 49 mL/min — ABNORMAL LOW (ref >60)
GFR, EST AFRICAN AMERICAN: 56 mL/min — AB (ref >60)
Glucose, Bld: 155 mg/dL — ABNORMAL HIGH (ref 65–99)
Potassium: 3.8 mmol/L (ref 3.5–5.1)
SODIUM: 142 mmol/L (ref 135–145)

## 2017-03-05 LAB — BRAIN NATRIURETIC PEPTIDE: B NATRIURETIC PEPTIDE 5: 133.6 pg/mL — AB (ref 0.0–100.0)

## 2017-03-05 MED ORDER — METOLAZONE 2.5 MG PO TABS: ORAL_TABLET | ORAL | 0 refills | Status: DC

## 2017-03-05 MED ORDER — AMLODIPINE BESYLATE 5 MG PO TABS: 2.5000 mg | ORAL_TABLET | Freq: Every day | ORAL | 3 refills | Status: DC

## 2017-03-05 MED ORDER — TORSEMIDE 20 MG PO TABS: ORAL_TABLET | ORAL | 6 refills | Status: DC

## 2017-03-05 MED ORDER — SPIRONOLACTONE 25 MG PO TABS: 25.0000 mg | ORAL_TABLET | Freq: Every day | ORAL | 3 refills | Status: DC

## 2017-03-05 NOTE — Patient Instructions (Signed)
Will schedule you for an abdominal ultrasound at [place] to 80 mg (4 tabs) twice daily for one week. Then reduce daily dose to 80 mg (4 tabs) in am and 60 mg (3 tabs) in pm.  START Spironolactone 25 mg tablet once daily.  DECREASE Amlodipine to 2.5 mg (1/2 tab) once daily.  Take metolazone 2.5 mg tablet ONCE TODAY ONLY. You will have 2 extra tablets left in the bottle.  Only take those if we instruct you to.  Never take those without calling our office first.  EKG today.  Routine lab work today. Will notify you of abnormal results, otherwise no news is good news!  Follow up 2 weeks. Take all medication as prescribed the day of your appointment. Bring all medications with you to your appointment.  Do the following things EVERYDAY: 1) Weigh yourself in the morning before breakfast. Write it down and keep it in a log. 2) Take your medicines as prescribed 3) Eat low salt foods-Limit salt (sodium) to 2000 mg per day.  4) Stay as active as you can everyday 5) Limit all fluids for the day to less than 2 liters

## 2017-03-06 ENCOUNTER — Other Ambulatory Visit (HOSPITAL_COMMUNITY): Payer: Self-pay | Admitting: *Deleted

## 2017-03-06 DIAGNOSIS — I6523 Occlusion and stenosis of bilateral carotid arteries: Secondary | ICD-10-CM

## 2017-03-06 MED ORDER — TICAGRELOR 60 MG PO TABS: ORAL_TABLET | ORAL | 2 refills | Status: DC

## 2017-03-06 MED ORDER — SPIRONOLACTONE 25 MG PO TABS: 25.0000 mg | ORAL_TABLET | Freq: Every day | ORAL | 3 refills | Status: DC

## 2017-03-06 MED ORDER — METOLAZONE 2.5 MG PO TABS: ORAL_TABLET | ORAL | 0 refills | Status: DC

## 2017-03-06 MED ORDER — TORSEMIDE 20 MG PO TABS: ORAL_TABLET | ORAL | 6 refills | Status: DC

## 2017-03-07 ENCOUNTER — Other Ambulatory Visit (HOSPITAL_COMMUNITY): Payer: Self-pay | Admitting: Cardiology

## 2017-03-07 NOTE — Progress Notes (Signed)
Patient ID: [name] [name], female   DOB: May 08, 1951, 66 y.o.   MRN: [id number] PCP: Dr. Hilma Favors Cardiology: Dr. Aundra Dubin  66 yo with history of CAD s/p CABG in 12/05 and redo in 1/11, bioprosthetic AVR, diastolic CHF, and sick sinus syndrome s/p PCM presents for cardiology followup.  She had initial CABG with SVG-RCA in 12/05 followed by redo SVG-RCA and bioprosthetic AVR in 1/11.  She has had diastolic CHF and is on Lasix.  In 9/14, she had a CPX test.  This showed severe functional limitation with ischemic ECG changes (inferolateral ST depression and ST elevation in V1 and V2). She had chest pain.  She had R/LHC in 9/14.  This showed elevated left and right heart filling pressures, patent SVG-PDA, and 80% distal LM/80% ostial LAD stenosis.  She was not a candidate for redo CABG (had 2 prior sternotomies as well as chest radiation for Hodgkins lymphoma).  She had DES from left main into proximal LAD and will need long-term DAPT => Brilinta was used as she has been a poor responder to Plavix in the past.  She re-developed angina in 11/15 and was admitted with unstable angina.  LHC showed 80% ostial LM and patent SVG-RCA. She had DES to ostial LM. Last echo in 1/18 showed EF 60-65%, normal bioprosthetic aortic valve, mild-moderate MR, PASP 35 mmHg.   She has chronic iron deficiency anemia thought to be due to a slow GI bleed.  Last EGD was unremarkable and a capsule endoscopy was also unremarkable.  She has had periodic transfusions; However, recently hemoglobin has been stable.    She had bilateral mastectomy for DCIS in 31/51 without complication.  She has an insulin pump followed by an endocrinologist at [facility].   Given myalgias with statins, she was started on Praluent.  LDL is now excellent.    In the past she has had short runs of atrial fibrillation.  She is not anticoagulated.  Planned for Watchman placement, but given her nickel allergy, we were unable to place a Watchman.  Today, she is in NSR.    Cardiolite in 2/18 showed no ischemia or infarction.     Diagnosed with OSA, now on CPAP.   She was doing well when last seen in our office, but since June has been more short of breath.  She was walking 8 miles/week, now she is short of breath walking < 1/2 block.  She has been waking up short of breath (consistent with PND) for the last 2 weeks.  However, her weight is not up significantly.  She has had abdominal distention for a month.  Overall feels bad.  No chest pain.  Less palpitations now that she is wearing CPAP.   ECG (personally reviewed): NSR with long 1st degree AV block  Labs (7/14): hemoglobin 11 Labs (9/14): K 3.9, creatinine 1.48, hemoglobin 7.3 Labs (10/14): hemoglobin 7.7 Labs (11/14): K 3.8, creatinine 1.5 Labs (12/14): HCT 37.7 Labs (1/15): LDL 106, HDL 41, K 3.9, creatinine 1.6, LFTs normal Labs (2/15): K 3.7, creatinine 1.1, BNP 71 Labs (3/15): HCT 36.7 Labs (4/15): K 3.7, creatinine 1.1, HCT 35.5 Labs (7/15): HCT 36.4 Labs (11/15): HCT 32.1, K 4.2, creatinine 1.06 Labs (12/15): hgb 12 Labs (1/16): HCT 37.1 Labs (3/16): LDL 25, HDL 42, elevated transaminases Labs (6/16): HCT 39.6, plts 146 Labs (7/16): LDL 59, HDL 41, AST 34, ALT 49, AP 125 Labs (11/16): HCT 38.8, alkaline phosphatase 156, AST/ALT normal, K 4, creatinine 1.0 Labs (3/17): K 4.4, creatinine 1.4,  LFTs normal, hgb 12.5 Labs (5/17): HCT 41.8, K 4, creatinine 1.3 Labs (9/17): K 4, creatinine 0.43, hgb 13.6 Labs (10/17): LDL 42, HDL 43 Labs (1/18): K 4.3, creatinine 1.1 => 1.19, AST 66, ALT 67, hgb 14.2, BNP 72 Labs (2/18): K 3.9, creatinine 1.2, BNP 49.6, HCT 42.1 Labs (4/18): hgb 13.8 Labs (5/18): HCT 41.7, plts 98, K 4.3, creatinine 1.11, BNP 180 Labs (7/18): hgb 12, plts 110, K 4, creatinine 1.23, AST 38, ALT 44  PMH: 1. Sick sinus syndrome s/p [employer] PCM.  2. HTN 3. H/o SBO 4. Renal cell carcinoma s/p right nephrectomy in 2008.  5. 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  Fibroscan in  11/14 showed advanced fibrosis concerning for cirrhosis. EGD in 11/14 showed no varices.  6. Diastolic CHF: Echo (8/09) with EF 60-65%, bioprosthetic aortic valve with mean gradient 15 mmHg, mild MR.  CPX (9/14): peak VO2 10.4, VE/VCO2 43.8, inferolateral ST depression and ST elevation in V1/V2 with severe dyspnea and chest pain, severe functional limitation with ischemic changes.  Echo (9/14) with EF 60-65%, normal RV size and systolic function, mild-moderate MR, bioprosthetic aortic valve normal, PA systolic pressure 51 mmHg, no evidence for pericardial constriction though exam incomplete for constriction.  Echo (11/15) with EF 55-60%, bioprosthetic aortic valve, mild-moderate MR, moderate TR, PA systolic pressure 42 mmHg.  - TEE (5/17) with EF 60-65%, normal bioprosthetic aortic valve with mean gradient 13 mmHg, normal RV size and systolic function. - Echo (1/18) with EF 60-65%, normal bioprosthetic aortic valve, mild-moderate MR, PASP 35 mmHg.  7. TAH-BSO 8. Type II diabetes: Has insulin pump.  9. H/o ischemic colitis. 10. H/o PUD. 11. Diabetic gastroparesis. 12. Hyperlipidemia: Myalgias with Crestor and atorvastatin, elevated LFTs with simvastatin.  Myalgias with > 10 mg pravastatin. Now on Praluent.  13. CKD 14. Bicuspid aortic valve s/p 19 mm Edwards pericardial valve in 1/11 (had post-operative Dresslers syndrome).   15. CAD: CABG with SVG-RCA in 12/05.  Redo SVG-RCA in 1/11 with AVR.  LHC/RHC (9/14) with mean RA 18, PA 62/25 mean 43, mean PCWP 26, CI 2.8, 70-80% distal LM stenosis, 80% ostial LAD stenosis, total occlusion RCA, SVG-PDA patent.  Patient had DES LM into LAD.  Plan to continue long-term ASA/Brilinta (poor responder to Plavix).  ETT-cardiolite (4/15) with 6' exercise, EF 60%, ST depression in recovery and mild chest pain, no ischemia or infarction by perfusion images.  Unstable angina 11/15 with 80% LM, patent SVG-RCA => DES to ostial LM.   - Lexiscan Cardiolite (2/18): EF 62%, no  ischemia or infarction (normal).  She had a significant reaction to [place] and probably should not get again.  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  17. Anemia: Iron-deficiency.  Suspect chronic GI blood loss.  EGD in 9/14 was unremarkable.  Capsule endoscopy 10/14 was unremarkable.  Has required periodic blood transfusions.  18. Atrial fibrillation: Paroxysmal, noted by [facility] interrogation.  19. Carotid stenosis: Carotid dopplers (2/15) with 40-59% bilateral stenosis. Carotid dopplers (3/16) with 40-59% bilateral ICA stenosis. Carotid dopplers (3/17) with 40-59% bilateral ICA stenosis.  - Carotid dopplers (3/18) with mild BICA stenosis.  20. 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  22. C difficile colitis 2017 23. Gout 24. ?TIA: head CT negative.  25. OSA: Moderate, uses CPAP.  26. Chronic thrombocytopenia  SH: Married, lives in [place], nonsmoker.   FH: CAD  ROS:  All systems reviewed and negative except as per HPI.   Current Outpatient Prescriptions  Medication Sig Dispense Refill  . Alirocumab (PRALUENT [place]) Inject 150 mLs into the skin every 14 (fourteen) days.    . ALPRAZolam (XANAX) 0.5 MG tablet Take 0.5 mg by mouth at bedtime. May take additional dose daily for anxiety    . amLODipine (NORVASC) 5 MG tablet Take 0.5 tablets (2.5 mg total) by mouth daily. 45 tablet 3  . APIDRA 100 UNIT/ML injection Inject 46 Units into the skin as directed. INFUSE THROUGH INSULIN PUMP UTD::[place] 46.15 units daily, Bolus depends on carb intake  2  . aspirin EC 81 MG tablet Take 81 mg by mouth at bedtime.    Marland Kitchen azithromycin (ZITHROMAX) 500 MG tablet 1 tablet by mouth one hour prior to dental appointment/procedure 2 tablet 0  . Calcium Carbonate-Vitamin D (RA CALCIUM PLUS VITAMIN D) 600-400 MG-UNIT per tablet Take 1 tablet by mouth  daily.     . Cholecalciferol 2000 UNITS TABS Take 1 tablet by mouth every morning.     . clobetasol (TEMOVATE) 0.05 % external solution Apply 1 application topically daily as needed (for hives). Reported on 12/05/2015    . Coenzyme Q10 (CO Q 10 PO) Take 1 tablet by mouth at bedtime.    . colchicine 0.6 MG tablet Take 0.6 mg by mouth at bedtime. And,Take 1 tablet at onset and repeate in 1 hour and then 1 daily x 2 (FOR FLARES)     . doxycycline (VIBRAMYCIN) 100 MG capsule Take 1 capsule (100 mg total) by mouth 2 (two) times daily. 20 capsule 0  . Fe Fum-FA-B Cmp-C-Zn-Mg-Mn-Cu (HEMATINIC PLUS COMPLEX) 106-1 MG TABS Take 1 tablet by mouth every Monday, Wednesday, and Friday. Take 1 tablet oraly three times a week or as directed daily with with a Vitamin C tablet 90 each 0  . glucagon (GLUCAGON EMERGENCY) 1 MG injection Inject 1 mg into the vein once as needed (for severe reaction). Reported on 12/05/2015    . isosorbide mononitrate (IMDUR) 60 MG 24 hr tablet TAKE 1 TABLET BY MOUTH ONCE DAILY 90 tablet 3  . latanoprost (XALATAN) 0.005 % ophthalmic solution Place 1 drop into both eyes at bedtime.    . Magnesium Oxide 250 MG TABS Take 250 mg by mouth at bedtime.     . metoprolol (LOPRESSOR) 50 MG tablet TAKE 1 AND 1/2 TABLETS(75 MG) BY MOUTH TWICE DAILY 270 tablet 3  . Multiple Vitamins-Minerals (MULTIVITAMIN WITH MINERALS) tablet Take 1 tablet by mouth every morning.     . nitroGLYCERIN (NITROLINGUAL) 0.4 MG/SPRAY spray Place 1 spray under the tongue every 5 (five) minutes as needed for chest pain. 12 g 1  . Omega-3 Fatty Acids (FISH OIL) 1000 MG CAPS Take 1,000 mg by mouth at bedtime.     Marland Kitchen omeprazole (PRILOSEC) 20 MG capsule Take 20 mg by mouth daily as needed. TAKES IN THE EVENING    . Polyethyl Glycol-Propyl Glycol (SYSTANE) 0.4-0.3 % SOLN Apply 1 drop to eye daily as needed (dry eyes).    . potassium chloride (MICRO-K) 10 MEQ CR capsule Take 60 mEq by mouth 2 (two) times daily.    . pravastatin  (PRAVACHOL) 10 MG tablet TAKE 1 TABLET BY MOUTH ONCE DAILY (Patient taking differently: TAKE 10 mg TABLET BY MOUTH ONCE DAILY) 90 tablet 3  . triamcinolone cream (KENALOG) 0.5 % Apply 1 application topically daily as needed (inflamation).     Marland Kitchen ULORIC 40 MG tablet Take 40  mg by mouth at bedtime.   11  . metolazone (ZAROXOLYN) 2.5 MG tablet Take 2.5 mg (1 tab) once as directed by CHF clinic. Call before taking [phone] 3 tablet 0  . spironolactone (ALDACTONE) 25 MG tablet Take 1 tablet (25 mg total) by mouth daily. 90 tablet 3  . ticagrelor (BRILINTA) 60 MG TABS tablet TAKE 1 TABLET(60 MG) BY MOUTH TWICE DAILY 180 tablet 2  . torsemide (DEMADEX) 20 MG tablet Take 80 mg (4 tabs) in am and 60 mg (3 tabs) in pm 190 tablet 6   No current facility-administered medications for this encounter.     BP (!) 126/58 (BP Location: Left Arm, Patient Position: Sitting, Cuff Size: Normal)   Pulse 85   Wt 120 lb (54.4 kg)   SpO2 97%   BMI 21.26 kg/m  General: NAD Neck: JVP 10-12, no thyromegaly or thyroid nodule.  Lungs: Clear to auscultation bilaterally with normal respiratory effort. CV: Nondisplaced PMI.  Heart regular S1/S2, no S3/S4, 2/6 SEM RUSB.  1+ ankle edema.  No carotid bruit.  Normal pedal pulses.  Abdomen: Soft, nontender, no hepatosplenomegaly, mild abdominal distention.  Skin: Intact without lesions or rashes.  Neurologic: Alert and oriented x 3.  Psych: Normal affect. Extremities: No clubbing or cyanosis.  HEENT: Normal.   Assessment/Plan: 1. CAD: s/p SVG-RCA in 12/05, then redo SVG-RCA in 1/11--> distal left main/proximal LAD severe stenosis in 9/14.  PCI with DES to distal LM/proximal LAD was done rather than bypass as she would have been a poor candidate for redo sternotomy (2 prior sternotomies and history of chest wall radiation for Hodgkins disease).  Recurrent unstable angina with 80% ostial LM treated with DES in 11/15.  Recent Lexiscan Cardiolite in 2/18 showed normal EF, no  ischemia/infarction.  No recent chest pain.  - Continue Brilinta 60 mg bid (poor responder to plavix) long-term given recurrent left main disease.  This has been complicated in the past by suspected slow GI bleed. Most recent hemoglobin was normal. - Continue ASA 81, statin, metoprolol, Imdur at the current doses.    2. Bioprosthetic aortic valve replacement: In setting of bicuspid aortic valve, thoracic aorta not dilated on 2017 TEE.  Stable on 1/18 echo.   3. Chronic diastolic CHF: Recent echo unchanged compared to the past.  She has been having more difficulty with volume retention recently.  Symptoms are worsened, NYHA class III.  She is volume overloaded on exam.   - Increase torsemide to 80 mg bid x 1 week then 80 qam/60 qpm.   - With diastolic CHF and abdominal distention/?ascites, will add spironolactone 25 mg daily.   - I will have her take 1 dose of metolazone 2.5 tomorrow.  - BMET/BNP today and repeat BMET in 10 days. 4. CKD:  Patient has a single kidney s/p right nephrectomy.  She sees nephrology. BMET today.  5. Hyperlipidemia: She has not been able to tolerate any higher potency statin than pravastatin 10 mg daily.   - Continue Praluent, good lipids when last checked.       6. Anemia: Suspected slow GI bleed.  Unremarkable EGD and capsule endoscopy in fall 2014.  Unfortunately, needs to stay on Brilinta long-term (Plavix non-responder).  Will need to follow closely (sees hematology). Recently, hemoglobin has been more stable.   7. Atrial fibrillation: Paroxysmal, brief episodes noted on PCM interrogation.  Given GI bleeding and need to be on Brilinta, risks have been thought to outweigh benefits of anticoagulation.  She is in  NSR today.  Less palpitations now that she is using CPAP.  She is not a good candidate for anticoagulants due to to GI bleed.  She is not a good Multaq candidate with CHF.  If she needs an antiarrhythmic, consider Tikosyn or Sotalol.  - We were unable to place  Watchman device due to nickel allergy.   8. Carotid bruit: Only mild stenosis on last dopplers in 3/18. 9. SSS: Pacemaker in place.  10. HTN: controlled. Continue current regimen.   11. OSA: Moderate, using CPAP.  12. ?TIA: Transient dysarthria in 3/18, could have been afib-related TIA.  No recurrent symptoms.  13. Thrombocytopenia: Chronic.  Needs to continue to follow with heme/onc.   14. Abdominal distention: Will get abdominal US to look for ascites and to assess liver.   Followup in 2 weeks   [name] 03/07/2017

## 2017-03-08 ENCOUNTER — Telehealth (HOSPITAL_COMMUNITY): Payer: Self-pay | Admitting: *Deleted

## 2017-03-08 ENCOUNTER — Ambulatory Visit (HOSPITAL_COMMUNITY)
Admission: RE | Admit: 2017-03-08 | Discharge: 2017-03-08 | Disposition: A | Discharge status: Home / Self Care | Payer: Medicare Other | Source: Ambulatory Visit | Attending: Internal Medicine | Admitting: Internal Medicine

## 2017-03-08 DIAGNOSIS — I5032 Chronic diastolic (congestive) heart failure: Secondary | ICD-10-CM

## 2017-03-08 LAB — BASIC METABOLIC PANEL
Anion gap: 13 (ref 5–15)
BUN: 41 mg/dL — ABNORMAL HIGH (ref 6–20)
CALCIUM: 10.8 mg/dL — AB (ref 8.9–10.3)
CO2: 29 mmol/L (ref 22–32)
CREATININE: 1.47 mg/dL — AB (ref 0.44–1.00)
Chloride: 94 mmol/L — ABNORMAL LOW (ref 101–111)
GFR calc Af Amer: 43 mL/min — ABNORMAL LOW (ref >60)
GFR calc non Af Amer: 37 mL/min — ABNORMAL LOW (ref >60)
GLUCOSE: 117 mg/dL — AB (ref 65–99)
Potassium: 3.5 mmol/L (ref 3.5–5.1)
Sodium: 136 mmol/L (ref 135–145)

## 2017-03-08 NOTE — Telephone Encounter (Signed)
Associated Results   Basic metabolic panel  Order: [id number]  Status:  Final result Visible to patient:  Yes (MyChart) Dx:  Chronic diastolic heart failure ([place])  Notes recorded by Darron Doom, RN on 03/08/2017 at 3:03 PM EDT Called and spoke with patient she is agreeable with plan. No further questions. ------  Notes recorded by Arbutus Leas, NP on 03/08/2017 at 1:57 PM EDT Please have her reduce torsemide to 14m in the am and 60 mg in the pm tomorrow (instead of doing 80/80 mg for a full week). Thank you.

## 2017-03-08 NOTE — Telephone Encounter (Signed)
Patient agrees to come in, appointment scheduled and BMET ordered.

## 2017-03-08 NOTE — Telephone Encounter (Signed)
Please have her come in today for a BMET. Thanks.

## 2017-03-08 NOTE — Telephone Encounter (Signed)
Patient called in this morning complaining of having a dull headache and cramps in her ankles/feet/toes.  Patient was seen by Dr. Aundra Dubin on Tuesday 8/14.  Patient's diuretics were increased and spiro and metolazone were prescribed.  Patient's weight was 114 lbs yesterday and this morning its 110.8 lbs.  She still complains of lower abdominal swelling.  She has already taken her morning torsemide, spiro, and potassium.  Will send to Jettie Booze, NP to review.

## 2017-03-12 ENCOUNTER — Ambulatory Visit (HOSPITAL_COMMUNITY)
Admission: RE | Admit: 2017-03-12 | Discharge: 2017-03-12 | Disposition: A | Discharge status: Home / Self Care | Payer: Medicare Other | Source: Ambulatory Visit | Attending: Cardiology | Admitting: Cardiology

## 2017-03-12 DIAGNOSIS — N281 Cyst of kidney, acquired: Secondary | ICD-10-CM | POA: Diagnosis not present

## 2017-03-12 DIAGNOSIS — R140 Abdominal distension (gaseous): Secondary | ICD-10-CM | POA: Insufficient documentation

## 2017-03-12 DIAGNOSIS — R188 Other ascites: Secondary | ICD-10-CM

## 2017-03-12 DIAGNOSIS — Z905 Acquired absence of kidney: Secondary | ICD-10-CM | POA: Diagnosis not present

## 2017-03-12 DIAGNOSIS — K769 Liver disease, unspecified: Secondary | ICD-10-CM | POA: Diagnosis not present

## 2017-03-12 DIAGNOSIS — K8020 Calculus of gallbladder without cholecystitis without obstruction: Secondary | ICD-10-CM | POA: Diagnosis not present

## 2017-03-13 ENCOUNTER — Encounter (HOSPITAL_COMMUNITY): Payer: Self-pay | Admitting: Cardiology

## 2017-03-15 ENCOUNTER — Other Ambulatory Visit (HOSPITAL_COMMUNITY): Payer: Self-pay | Admitting: Cardiology

## 2017-03-15 MED ORDER — POTASSIUM CHLORIDE ER 10 MEQ PO CPCR: 60.0000 meq | ORAL_CAPSULE | Freq: Two times a day (BID) | ORAL | 3 refills | Status: DC

## 2017-03-21 ENCOUNTER — Ambulatory Visit (HOSPITAL_COMMUNITY)
Admission: RE | Admit: 2017-03-21 | Discharge: 2017-03-21 | Disposition: A | Discharge status: Home / Self Care | Payer: Medicare Other | Source: Ambulatory Visit | Attending: Cardiology | Admitting: Cardiology

## 2017-03-21 ENCOUNTER — Encounter (HOSPITAL_COMMUNITY): Payer: Self-pay | Admitting: Cardiology

## 2017-03-21 VITALS — BP 126/61 | HR 72 | Wt 112.2 lb

## 2017-03-21 DIAGNOSIS — I130 Hypertensive heart and chronic kidney disease with heart failure and stage 1 through stage 4 chronic kidney disease, or unspecified chronic kidney disease: Secondary | ICD-10-CM | POA: Insufficient documentation

## 2017-03-21 DIAGNOSIS — Z905 Acquired absence of kidney: Secondary | ICD-10-CM | POA: Diagnosis not present

## 2017-03-21 DIAGNOSIS — Z8711 Personal history of peptic ulcer disease: Secondary | ICD-10-CM | POA: Insufficient documentation

## 2017-03-21 DIAGNOSIS — I2510 Atherosclerotic heart disease of native coronary artery without angina pectoris: Secondary | ICD-10-CM

## 2017-03-21 DIAGNOSIS — M109 Gout, unspecified: Secondary | ICD-10-CM | POA: Diagnosis not present

## 2017-03-21 DIAGNOSIS — Z794 Long term (current) use of insulin: Secondary | ICD-10-CM | POA: Insufficient documentation

## 2017-03-21 DIAGNOSIS — Z950 Presence of cardiac pacemaker: Secondary | ICD-10-CM | POA: Insufficient documentation

## 2017-03-21 DIAGNOSIS — Q231 Congenital insufficiency of aortic valve: Secondary | ICD-10-CM | POA: Insufficient documentation

## 2017-03-21 DIAGNOSIS — Z8571 Personal history of Hodgkin lymphoma: Secondary | ICD-10-CM | POA: Diagnosis not present

## 2017-03-21 DIAGNOSIS — Z853 Personal history of malignant neoplasm of breast: Secondary | ICD-10-CM | POA: Diagnosis not present

## 2017-03-21 DIAGNOSIS — I480 Paroxysmal atrial fibrillation: Secondary | ICD-10-CM

## 2017-03-21 DIAGNOSIS — N189 Chronic kidney disease, unspecified: Secondary | ICD-10-CM | POA: Insufficient documentation

## 2017-03-21 DIAGNOSIS — Z953 Presence of xenogenic heart valve: Secondary | ICD-10-CM | POA: Insufficient documentation

## 2017-03-21 DIAGNOSIS — Z8673 Personal history of transient ischemic attack (TIA), and cerebral infarction without residual deficits: Secondary | ICD-10-CM | POA: Diagnosis not present

## 2017-03-21 DIAGNOSIS — E1122 Type 2 diabetes mellitus with diabetic chronic kidney disease: Secondary | ICD-10-CM | POA: Diagnosis not present

## 2017-03-21 DIAGNOSIS — I5032 Chronic diastolic (congestive) heart failure: Secondary | ICD-10-CM

## 2017-03-21 DIAGNOSIS — E785 Hyperlipidemia, unspecified: Secondary | ICD-10-CM | POA: Insufficient documentation

## 2017-03-21 DIAGNOSIS — I2582 Chronic total occlusion of coronary artery: Secondary | ICD-10-CM | POA: Insufficient documentation

## 2017-03-21 DIAGNOSIS — G4733 Obstructive sleep apnea (adult) (pediatric): Secondary | ICD-10-CM | POA: Insufficient documentation

## 2017-03-21 DIAGNOSIS — I25110 Atherosclerotic heart disease of native coronary artery with unstable angina pectoris: Secondary | ICD-10-CM | POA: Diagnosis not present

## 2017-03-21 DIAGNOSIS — Z9889 Other specified postprocedural states: Secondary | ICD-10-CM | POA: Insufficient documentation

## 2017-03-21 DIAGNOSIS — Z9013 Acquired absence of bilateral breasts and nipples: Secondary | ICD-10-CM | POA: Diagnosis not present

## 2017-03-21 DIAGNOSIS — Z8249 Family history of ischemic heart disease and other diseases of the circulatory system: Secondary | ICD-10-CM | POA: Insufficient documentation

## 2017-03-21 DIAGNOSIS — Z85528 Personal history of other malignant neoplasm of kidney: Secondary | ICD-10-CM | POA: Insufficient documentation

## 2017-03-21 DIAGNOSIS — E1143 Type 2 diabetes mellitus with diabetic autonomic (poly)neuropathy: Secondary | ICD-10-CM | POA: Diagnosis not present

## 2017-03-21 DIAGNOSIS — Z7982 Long term (current) use of aspirin: Secondary | ICD-10-CM | POA: Insufficient documentation

## 2017-03-21 DIAGNOSIS — Z951 Presence of aortocoronary bypass graft: Secondary | ICD-10-CM | POA: Insufficient documentation

## 2017-03-21 DIAGNOSIS — Z90710 Acquired absence of both cervix and uterus: Secondary | ICD-10-CM | POA: Insufficient documentation

## 2017-03-21 DIAGNOSIS — N179 Acute kidney failure, unspecified: Secondary | ICD-10-CM

## 2017-03-21 DIAGNOSIS — I495 Sick sinus syndrome: Secondary | ICD-10-CM | POA: Diagnosis not present

## 2017-03-21 DIAGNOSIS — Z91048 Other nonmedicinal substance allergy status: Secondary | ICD-10-CM | POA: Diagnosis not present

## 2017-03-21 DIAGNOSIS — D509 Iron deficiency anemia, unspecified: Secondary | ICD-10-CM | POA: Diagnosis not present

## 2017-03-21 DIAGNOSIS — Z90722 Acquired absence of ovaries, bilateral: Secondary | ICD-10-CM | POA: Insufficient documentation

## 2017-03-21 LAB — BASIC METABOLIC PANEL
ANION GAP: 13 (ref 5–15)
BUN: 90 mg/dL — ABNORMAL HIGH (ref 6–20)
CHLORIDE: 96 mmol/L — AB (ref 101–111)
CO2: 26 mmol/L (ref 22–32)
Calcium: 10.3 mg/dL (ref 8.9–10.3)
Creatinine, Ser: 2.05 mg/dL — ABNORMAL HIGH (ref 0.44–1.00)
GFR calc non Af Amer: 25 mL/min — ABNORMAL LOW (ref >60)
GFR, EST AFRICAN AMERICAN: 29 mL/min — AB (ref >60)
Glucose, Bld: 260 mg/dL — ABNORMAL HIGH (ref 65–99)
Potassium: 4.6 mmol/L (ref 3.5–5.1)
Sodium: 135 mmol/L (ref 135–145)

## 2017-03-21 LAB — CBC
HEMATOCRIT: 42.7 % (ref 36.0–46.0)
HEMOGLOBIN: 14 g/dL (ref 12.0–15.0)
MCH: 28.5 pg (ref 26.0–34.0)
MCHC: 32.8 g/dL (ref 30.0–36.0)
MCV: 86.8 fL (ref 78.0–100.0)
Platelets: 120 10*3/uL — ABNORMAL LOW (ref 150–400)
RBC: 4.92 MIL/uL (ref 3.87–5.11)
RDW: 14.3 % (ref 11.5–15.5)
WBC: 7 10*3/uL (ref 4.0–10.5)

## 2017-03-21 MED ORDER — POTASSIUM CHLORIDE ER 10 MEQ PO CPCR: 40.0000 meq | ORAL_CAPSULE | Freq: Two times a day (BID) | ORAL | 3 refills | Status: DC

## 2017-03-21 MED ORDER — PANTOPRAZOLE SODIUM 40 MG PO TBEC: 40.0000 mg | DELAYED_RELEASE_TABLET | Freq: Every day | ORAL | 11 refills | Status: DC

## 2017-03-21 MED ORDER — TORSEMIDE 20 MG PO TABS: ORAL_TABLET | ORAL | 6 refills | Status: DC

## 2017-03-21 NOTE — Patient Instructions (Addendum)
Labs today (will call for abnormal results, otherwise no news is good news)  HOLD Torsemide for 3 days then restart it at 60 mg (3 Tablets) in the AM and 40 mg (2 Tablets) in the PM  HOLD Potassium for 3 days then restart it at 40 mEq Twice Daily  STOP taking Spironolactone  START Protonix 1 Tablet Daily  Low Sodium Diet  Schedule appointment with Dr. [name] for possible ulcer  Follow up in 2 weeks

## 2017-03-22 ENCOUNTER — Telehealth (HOSPITAL_COMMUNITY): Payer: Self-pay

## 2017-03-22 ENCOUNTER — Other Ambulatory Visit: Payer: Self-pay | Admitting: Cardiology

## 2017-03-22 NOTE — Telephone Encounter (Signed)
Patient called to confirm medication and lab instructions from yesterday's visit with Dr. Aundra Dubin. Advised per Dr. Aundra Dubin to restart medications on Monday and have labs drawn on Tuesday.  Renee Pain, RN

## 2017-03-23 NOTE — Progress Notes (Signed)
Patient ID: [name] [name], female   DOB: 01/21/1951, 66 y.o.   MRN: [id number] PCP: Dr. Hilma Favors Cardiology: Dr. Aundra Dubin  66 yo with history of CAD s/p CABG in 12/05 and redo in 1/11, bioprosthetic AVR, diastolic CHF, and sick sinus syndrome s/p PCM presents for cardiology followup.  She had initial CABG with SVG-RCA in 12/05 followed by redo SVG-RCA and bioprosthetic AVR in 1/11.  She has had diastolic CHF and is on Lasix.  In 9/14, she had a CPX test.  This showed severe functional limitation with ischemic ECG changes (inferolateral ST depression and ST elevation in V1 and V2). She had chest pain.  She had R/LHC in 9/14.  This showed elevated left and right heart filling pressures, patent SVG-PDA, and 80% distal LM/80% ostial LAD stenosis.  She was not a candidate for redo CABG (had 2 prior sternotomies as well as chest radiation for Hodgkins lymphoma).  She had DES from left main into proximal LAD and will need long-term DAPT => Brilinta was used as she has been a poor responder to Plavix in the past.  She re-developed angina in 11/15 and was admitted with unstable angina.  LHC showed 80% ostial LM and patent SVG-RCA. She had DES to ostial LM. Last echo in 1/18 showed EF 60-65%, normal bioprosthetic aortic valve, mild-moderate MR, PASP 35 mmHg.   She has chronic iron deficiency anemia thought to be due to a slow GI bleed.  Last EGD was unremarkable and a capsule endoscopy was also unremarkable.  She has had periodic transfusions; However, recently hemoglobin has been stable.    She had bilateral mastectomy for DCIS in 61/60 without complication.  She has an insulin pump followed by an endocrinologist at [facility].   Given myalgias with statins, she was started on Praluent.  LDL is now excellent.    In the past she has had short runs of atrial fibrillation.  She is not anticoagulated.  Planned for Watchman placement, but given her nickel allergy, we were unable to place a Watchman.  Today, she is in NSR.    Cardiolite in 2/18 showed no ischemia or infarction.     Diagnosed with OSA, now on CPAP.   She developed increased dyspnea and abdominal distention beginning 6/18.  At last appointment, I increased torsemide and gave 1 dose of metolazone.  Abdominal US showed fatty liver, no ascites or cirrhosis.  She still feels bad.  No energy and fatigues easily.  Short of breath with fast walking.  No PND/orthopnea.  Main complaint now is nausea, constipation, and pain in lower abdomen.  Nausea improves when she first eats then worsens about 1 hour after eating, she feels like she has an ulcer (felt like this with ulcer in past).  Weight down 8 lbs.  K noted to be 5 today with creatinine up to 2.1 and BUN 92.   Labs (7/14): hemoglobin 11 Labs (9/14): K 3.9, creatinine 1.48, hemoglobin 7.3 Labs (10/14): hemoglobin 7.7 Labs (11/14): K 3.8, creatinine 1.5 Labs (12/14): HCT 37.7 Labs (1/15): LDL 106, HDL 41, K 3.9, creatinine 1.6, LFTs normal Labs (2/15): K 3.7, creatinine 1.1, BNP 71 Labs (3/15): HCT 36.7 Labs (4/15): K 3.7, creatinine 1.1, HCT 35.5 Labs (7/15): HCT 36.4 Labs (11/15): HCT 32.1, K 4.2, creatinine 1.06 Labs (12/15): hgb 12 Labs (1/16): HCT 37.1 Labs (3/16): LDL 25, HDL 42, elevated transaminases Labs (6/16): HCT 39.6, plts 146 Labs (7/16): LDL 59, HDL 41, AST 34, ALT 49, AP 125 Labs (11/16): HCT  38.8, alkaline phosphatase 156, AST/ALT normal, K 4, creatinine 1.0 Labs (3/17): K 4.4, creatinine 1.4, LFTs normal, hgb 12.5 Labs (5/17): HCT 41.8, K 4, creatinine 1.3 Labs (9/17): K 4, creatinine 0.43, hgb 13.6 Labs (10/17): LDL 42, HDL 43 Labs (1/18): K 4.3, creatinine 1.1 => 1.19, AST 66, ALT 67, hgb 14.2, BNP 72 Labs (2/18): K 3.9, creatinine 1.2, BNP 49.6, HCT 42.1 Labs (4/18): hgb 13.8 Labs (5/18): HCT 41.7, plts 98, K 4.3, creatinine 1.11, BNP 180 Labs (7/18): hgb 12, plts 110, K 4, creatinine 1.23, AST 38, ALT 44 Labs (8/18): K 5, creatinine 1.17 => 2.1, BUN 33 => 92, BNP  134  PMH: 1. Sick sinus syndrome s/p [employer] PCM.  2. HTN 3. H/o SBO 4. Renal cell carcinoma s/p right nephrectomy in 2008.  5. 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  Fibroscan in 11/14 showed advanced fibrosis concerning for cirrhosis. EGD in 11/14 showed no varices.  6. Diastolic CHF: Echo (1/61) with EF 60-65%, bioprosthetic aortic valve with mean gradient 15 mmHg, mild MR.  CPX (9/14): peak VO2 10.4, VE/VCO2 43.8, inferolateral ST depression and ST elevation in V1/V2 with severe dyspnea and chest pain, severe functional limitation with ischemic changes.  Echo (9/14) with EF 60-65%, normal RV size and systolic function, mild-moderate MR, bioprosthetic aortic valve normal, PA systolic pressure 51 mmHg, no evidence for pericardial constriction though exam incomplete for constriction.  Echo (11/15) with EF 55-60%, bioprosthetic aortic valve, mild-moderate MR, moderate TR, PA systolic pressure 42 mmHg.  - TEE (5/17) with EF 60-65%, normal bioprosthetic aortic valve with mean gradient 13 mmHg, normal RV size and systolic function. - Echo (1/18) with EF 60-65%, normal bioprosthetic aortic valve, mild-moderate MR, PASP 35 mmHg.  7. TAH-BSO 8. Type II diabetes: Has insulin pump.  9. H/o ischemic colitis. 10. H/o PUD. 11. Diabetic gastroparesis. 12. Hyperlipidemia: Myalgias with Crestor and atorvastatin, elevated LFTs with simvastatin.  Myalgias with > 10 mg pravastatin. Now on Praluent.  13. CKD 14. Bicuspid aortic valve s/p 19 mm Edwards pericardial valve in 1/11 (had post-operative Dresslers syndrome).   15. CAD: CABG with SVG-RCA in 12/05.  Redo SVG-RCA in 1/11 with AVR.  LHC/RHC (9/14) with mean RA 18, PA 62/25 mean 43, mean PCWP 26, CI 2.8, 70-80% distal LM stenosis, 80% ostial LAD stenosis, total occlusion RCA, SVG-PDA patent.  Patient had DES LM into LAD.  Plan to continue long-term ASA/Brilinta (poor responder to Plavix).  ETT-cardiolite (4/15) with 6' exercise, EF 60%, ST depression in  recovery and mild chest pain, no ischemia or infarction by perfusion images.  Unstable angina 11/15 with 80% LM, patent SVG-RCA => DES to ostial LM.   - Lexiscan Cardiolite (2/18): EF 62%, no ischemia or infarction (normal).  She had a significant reaction to [place] and probably should not get again.  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  17. Anemia: Iron-deficiency.  Suspect chronic GI blood loss.  EGD in 9/14 was unremarkable.  Capsule endoscopy 10/14 was unremarkable.  Has required periodic blood transfusions.  18. Atrial fibrillation: Paroxysmal, noted by [facility] interrogation.  19. Carotid stenosis: Carotid dopplers (2/15) with 40-59% bilateral stenosis. Carotid dopplers (3/16) with 40-59% bilateral ICA stenosis. Carotid dopplers (3/17) with 40-59% bilateral ICA stenosis.  - Carotid dopplers (3/18) with mild BICA stenosis.  20. 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  22. C difficile colitis 2017  23. Gout 24. ?TIA: head CT negative.  25. OSA: Moderate, uses CPAP.  26. Chronic thrombocytopenia  SH: Married, lives in [place], nonsmoker.   FH: CAD  ROS: All systems reviewed and negative except as per HPI.   Current Outpatient Prescriptions  Medication Sig Dispense Refill  . Alirocumab (PRALUENT [place]) Inject 150 mLs into the skin every 14 (fourteen) days.    . ALPRAZolam (XANAX) 0.5 MG tablet Take 0.5 mg by mouth at bedtime. May take additional dose daily for anxiety    . amLODipine (NORVASC) 5 MG tablet Take 0.5 tablets (2.5 mg total) by mouth daily. 45 tablet 3  . APIDRA 100 UNIT/ML injection Inject 46 Units into the skin as directed. INFUSE THROUGH INSULIN PUMP UTD::[place] 46.15 units daily, Bolus depends on carb intake  2  . aspirin EC 81 MG tablet Take 81 mg by mouth at bedtime.    Marland Kitchen azithromycin (ZITHROMAX) 500 MG  tablet 1 tablet by mouth one hour prior to dental appointment/procedure 2 tablet 0  . Calcium Carbonate-Vitamin D (RA CALCIUM PLUS VITAMIN D) 600-400 MG-UNIT per tablet Take 1 tablet by mouth daily.     . Cholecalciferol 2000 UNITS TABS Take 1 tablet by mouth every morning.     . clobetasol (TEMOVATE) 0.05 % external solution Apply 1 application topically daily as needed (for hives). Reported on 12/05/2015    . Coenzyme Q10 (CO Q 10 PO) Take 1 tablet by mouth at bedtime.    . colchicine 0.6 MG tablet Take 0.6 mg by mouth at bedtime. And,Take 1 tablet at onset and repeate in 1 hour and then 1 daily x 2 (FOR FLARES)     . Fe Fum-FA-B Cmp-C-Zn-Mg-Mn-Cu (HEMATINIC PLUS COMPLEX) 106-1 MG TABS Take 1 tablet by mouth every Monday, Wednesday, and Friday. Take 1 tablet oraly three times a week or as directed daily with with a Vitamin C tablet 90 each 0  . glucagon (GLUCAGON EMERGENCY) 1 MG injection Inject 1 mg into the vein once as needed (for severe reaction). Reported on 12/05/2015    . isosorbide mononitrate (IMDUR) 60 MG 24 hr tablet TAKE 1 TABLET BY MOUTH ONCE DAILY 90 tablet 3  . latanoprost (XALATAN) 0.005 % ophthalmic solution Place 1 drop into both eyes at bedtime.    . Magnesium Oxide 250 MG TABS Take 250 mg by mouth at bedtime.     . metolazone (ZAROXOLYN) 2.5 MG tablet Take 2.5 mg (1 tab) once as directed by CHF clinic. Call before taking [phone] 3 tablet 0  . metoprolol (LOPRESSOR) 50 MG tablet TAKE 1 AND 1/2 TABLETS(75 MG) BY MOUTH TWICE DAILY 270 tablet 3  . Multiple Vitamins-Minerals (MULTIVITAMIN WITH MINERALS) tablet Take 1 tablet by mouth every morning.     . nitroGLYCERIN (NITROLINGUAL) 0.4 MG/SPRAY spray Place 1 spray under the tongue every 5 (five) minutes as needed for chest pain. 12 g 1  . Omega-3 Fatty Acids (FISH OIL) 1000 MG CAPS Take 1,000 mg by mouth at bedtime.     Marland Kitchen omeprazole (PRILOSEC) 20 MG capsule Take 20 mg by mouth daily as needed. TAKES IN THE EVENING    . Polyethyl  Glycol-Propyl Glycol (SYSTANE) 0.4-0.3 % SOLN Apply 1 drop to eye daily as needed (dry eyes).    . potassium chloride (MICRO-K) 10 MEQ CR capsule Take 4 capsules (40 mEq total) by mouth 2 (two) times daily. 240 capsule 3  . pravastatin (PRAVACHOL) 10 MG tablet TAKE 1 TABLET BY MOUTH ONCE DAILY 90 tablet 3  .  ticagrelor (BRILINTA) 60 MG TABS tablet TAKE 1 TABLET(60 MG) BY MOUTH TWICE DAILY 180 tablet 2  . torsemide (DEMADEX) 20 MG tablet Take 60 mg (3 tabs) in am and 40 mg (2 tabs) in pm 150 tablet 6  . triamcinolone cream (KENALOG) 0.5 % Apply 1 application topically daily as needed (inflamation).     Marland Kitchen ULORIC 40 MG tablet Take 40 mg by mouth at bedtime.   11  . pantoprazole (PROTONIX) 40 MG tablet Take 1 tablet (40 mg total) by mouth daily. 30 tablet 11   No current facility-administered medications for this encounter.     BP 126/61   Pulse 72   Wt 112 lb 4 oz (50.9 kg)   SpO2 100%   BMI 19.88 kg/m  General: NAD Neck: JVP not elevated, no thyromegaly or thyroid nodule.  Lungs: Clear to auscultation bilaterally with normal respiratory effort. CV: Nondisplaced PMI.  Heart regular S1/S2 with widely split S2, no S3/S4, 2/6 SEM RUSB.  No edema.  No carotid bruit.  Normal pedal pulses.  Abdomen: Soft, nontender, no hepatosplenomegaly, mild abdominal distention.  Skin: Intact without lesions or rashes.  Neurologic: Alert and oriented x 3.  Psych: Normal affect. Extremities: No clubbing or cyanosis.  HEENT: Normal.   Assessment/Plan: 1. CAD: s/p SVG-RCA in 12/05, then redo SVG-RCA in 1/11--> distal left main/proximal LAD severe stenosis in 9/14.  PCI with DES to distal LM/proximal LAD was done rather than bypass as she would have been a poor candidate for redo sternotomy (2 prior sternotomies and history of chest wall radiation for Hodgkins disease).  Recurrent unstable angina with 80% ostial LM treated with DES in 11/15.  Recent Lexiscan Cardiolite in 2/18 showed normal EF, no  ischemia/infarction.  No recent chest pain.  - Continue Brilinta 60 mg bid (poor responder to plavix) long-term given recurrent left main disease.  This has been complicated in the past by suspected slow GI bleed. Most recent hemoglobin was normal. - Continue ASA 81, statin, metoprolol, Imdur at the current doses.    2. Bioprosthetic aortic valve replacement: In setting of bicuspid aortic valve, thoracic aorta not dilated on 2017 TEE.  Stable on 1/18 echo.   3. Chronic diastolic CHF: Recent echo unchanged compared to the past.  NYHA class III, but more fatigue than dyspnea now.  She has lost 10 lbs with increased diuretics and is no longer volume overloaded on exam.  However, now has developed AKI.    - Stop torsemide and KCl x 3 days, then resume torsemide 60 qam/40 qpm.  - Stop spironolactone with borderline K elevation and AKI.  - BMET on Monday again.  4. AKI on CKD:  Patient has a single kidney s/p right nephrectomy.  Creatinine up to 2 with high BUN with diuresis.  She no longer looks volume overloaded.  Hold torsemide as above x 3 days then decrease dose.  Stop spironolactone.  5. Hyperlipidemia: She has not been able to tolerate any higher potency statin than pravastatin 10 mg daily.   - Continue Praluent, good lipids when last checked.       6. Anemia: Suspected slow GI bleed.  Unremarkable EGD and capsule endoscopy in fall 2014.  Unfortunately, needs to stay on Brilinta long-term (Plavix non-responder).  Will need to follow closely (sees hematology). Recently, hemoglobin has been more stable.   7. Atrial fibrillation: Paroxysmal, brief episodes noted on PCM interrogation.  Given GI bleeding and need to be on Brilinta, risks have been thought to  outweigh benefits of anticoagulation.  She is in NSR today.  Less palpitations now that she is using CPAP.  She is not a good candidate for anticoagulants due to to GI bleed.  She is not a good Multaq candidate with CHF.  If she needs an antiarrhythmic,  consider Tikosyn or Sotalol.  - We were unable to place Watchman device due to nickel allergy.   8. Carotid bruit: Only mild stenosis on last dopplers in 3/18. 9. SSS: Pacemaker in place.  10. HTN: controlled. Continue current regimen.   11. OSA: Moderate, using CPAP.  12. ?TIA: Transient dysarthria in 3/18, could have been afib-related TIA.  No recurrent symptoms.  13. Thrombocytopenia: Chronic.  Needs to continue to follow with heme/onc.   14. Nausea: Related to meals.  Has had ulcer in past and thinks seems like symptoms with prior ulcer.  May also be related to uremia with high BUN.  - As above, adjusting meds to improve renal function.  - Add Protonix 40 mg daily and will have her see her GI MD [name]).  May need EGD.   Followup in 2 weeks   [name] 03/23/2017

## 2017-03-26 ENCOUNTER — Encounter (HOSPITAL_COMMUNITY): Payer: Self-pay | Admitting: Cardiology

## 2017-03-26 ENCOUNTER — Ambulatory Visit (HOSPITAL_COMMUNITY)
Admission: RE | Admit: 2017-03-26 | Discharge: 2017-03-26 | Disposition: A | Discharge status: Home / Self Care | Payer: Medicare Other | Source: Ambulatory Visit | Attending: Cardiology | Admitting: Cardiology

## 2017-03-26 ENCOUNTER — Telehealth (HOSPITAL_COMMUNITY): Payer: Self-pay | Admitting: *Deleted

## 2017-03-26 DIAGNOSIS — I5032 Chronic diastolic (congestive) heart failure: Secondary | ICD-10-CM | POA: Insufficient documentation

## 2017-03-26 LAB — BASIC METABOLIC PANEL
ANION GAP: 9 (ref 5–15)
BUN: 39 mg/dL — AB (ref 6–20)
CALCIUM: 9.7 mg/dL (ref 8.9–10.3)
CO2: 26 mmol/L (ref 22–32)
Chloride: 105 mmol/L (ref 101–111)
Creatinine, Ser: 1.42 mg/dL — ABNORMAL HIGH (ref 0.44–1.00)
GFR calc Af Amer: 45 mL/min — ABNORMAL LOW (ref >60)
GFR, EST NON AFRICAN AMERICAN: 38 mL/min — AB (ref >60)
Glucose, Bld: 123 mg/dL — ABNORMAL HIGH (ref 65–99)
Potassium: 4.3 mmol/L (ref 3.5–5.1)
SODIUM: 140 mmol/L (ref 135–145)

## 2017-03-26 NOTE — Telephone Encounter (Signed)
Patient left message for Dr. Aundra Dubin when she came in for labs today stating that after restarting torsemide and potassium yesterday her stomach issues began again this morning.  Dr. Aundra Dubin advises patient to continue taking meds as prescribed that he does not believe they are related to her stomach problems.  Also reminded her to call her GI MD and schedule follow up as soon as possible.  I called patient back and she understands.  She stated she would call and schedule an appointment with GI today.

## 2017-03-27 ENCOUNTER — Encounter: Payer: Self-pay | Admitting: Cardiology

## 2017-03-28 ENCOUNTER — Telehealth: Payer: Self-pay | Admitting: Cardiology

## 2017-03-28 NOTE — Telephone Encounter (Signed)
See Pt advice request from 9/5.  Spoke with Dr. Radford Pax and she said ok to keep plan for 10 wk sleep f/u on 10/3 and to have pt try turning up humidifier on CPAP machine.  If water gets into line or mask, then has turned up too far.  Called pt with these recommendations.  Pt verbalized understanding and was in agreement with this plan.

## 2017-04-01 ENCOUNTER — Ambulatory Visit (HOSPITAL_COMMUNITY)
Admission: RE | Admit: 2017-04-01 | Discharge: 2017-04-01 | Disposition: A | Discharge status: Home / Self Care | Payer: Medicare Other | Source: Ambulatory Visit | Attending: Internal Medicine | Admitting: Internal Medicine

## 2017-04-01 DIAGNOSIS — I5032 Chronic diastolic (congestive) heart failure: Secondary | ICD-10-CM | POA: Diagnosis not present

## 2017-04-01 LAB — BASIC METABOLIC PANEL
ANION GAP: 7 (ref 5–15)
BUN: 34 mg/dL — ABNORMAL HIGH (ref 6–20)
CHLORIDE: 103 mmol/L (ref 101–111)
CO2: 30 mmol/L (ref 22–32)
Calcium: 9.6 mg/dL (ref 8.9–10.3)
Creatinine, Ser: 1.4 mg/dL — ABNORMAL HIGH (ref 0.44–1.00)
GFR calc Af Amer: 45 mL/min — ABNORMAL LOW (ref >60)
GFR calc non Af Amer: 39 mL/min — ABNORMAL LOW (ref >60)
GLUCOSE: 158 mg/dL — AB (ref 65–99)
POTASSIUM: 3.6 mmol/L (ref 3.5–5.1)
Sodium: 140 mmol/L (ref 135–145)

## 2017-04-08 ENCOUNTER — Ambulatory Visit (HOSPITAL_COMMUNITY)
Admission: RE | Admit: 2017-04-08 | Discharge: 2017-04-08 | Disposition: A | Discharge status: Home / Self Care | Payer: Medicare Other | Source: Ambulatory Visit | Attending: Cardiology | Admitting: Cardiology

## 2017-04-08 VITALS — BP 142/62 | HR 82 | Wt 118.0 lb

## 2017-04-08 DIAGNOSIS — E785 Hyperlipidemia, unspecified: Secondary | ICD-10-CM | POA: Diagnosis not present

## 2017-04-08 DIAGNOSIS — I130 Hypertensive heart and chronic kidney disease with heart failure and stage 1 through stage 4 chronic kidney disease, or unspecified chronic kidney disease: Secondary | ICD-10-CM | POA: Insufficient documentation

## 2017-04-08 DIAGNOSIS — R55 Syncope and collapse: Secondary | ICD-10-CM

## 2017-04-08 DIAGNOSIS — D649 Anemia, unspecified: Secondary | ICD-10-CM | POA: Insufficient documentation

## 2017-04-08 DIAGNOSIS — Z8249 Family history of ischemic heart disease and other diseases of the circulatory system: Secondary | ICD-10-CM | POA: Diagnosis not present

## 2017-04-08 DIAGNOSIS — M109 Gout, unspecified: Secondary | ICD-10-CM | POA: Insufficient documentation

## 2017-04-08 DIAGNOSIS — I495 Sick sinus syndrome: Secondary | ICD-10-CM | POA: Insufficient documentation

## 2017-04-08 DIAGNOSIS — E1143 Type 2 diabetes mellitus with diabetic autonomic (poly)neuropathy: Secondary | ICD-10-CM | POA: Insufficient documentation

## 2017-04-08 DIAGNOSIS — Z8571 Personal history of Hodgkin lymphoma: Secondary | ICD-10-CM | POA: Diagnosis not present

## 2017-04-08 DIAGNOSIS — Z8711 Personal history of peptic ulcer disease: Secondary | ICD-10-CM | POA: Insufficient documentation

## 2017-04-08 DIAGNOSIS — N189 Chronic kidney disease, unspecified: Secondary | ICD-10-CM | POA: Insufficient documentation

## 2017-04-08 DIAGNOSIS — Z951 Presence of aortocoronary bypass graft: Secondary | ICD-10-CM | POA: Diagnosis not present

## 2017-04-08 DIAGNOSIS — G4733 Obstructive sleep apnea (adult) (pediatric): Secondary | ICD-10-CM | POA: Insufficient documentation

## 2017-04-08 DIAGNOSIS — E1122 Type 2 diabetes mellitus with diabetic chronic kidney disease: Secondary | ICD-10-CM | POA: Diagnosis not present

## 2017-04-08 DIAGNOSIS — I2510 Atherosclerotic heart disease of native coronary artery without angina pectoris: Secondary | ICD-10-CM | POA: Diagnosis not present

## 2017-04-08 DIAGNOSIS — Z7982 Long term (current) use of aspirin: Secondary | ICD-10-CM | POA: Diagnosis not present

## 2017-04-08 DIAGNOSIS — I5022 Chronic systolic (congestive) heart failure: Secondary | ICD-10-CM

## 2017-04-08 DIAGNOSIS — Z905 Acquired absence of kidney: Secondary | ICD-10-CM | POA: Insufficient documentation

## 2017-04-08 DIAGNOSIS — Z953 Presence of xenogenic heart valve: Secondary | ICD-10-CM | POA: Diagnosis not present

## 2017-04-08 DIAGNOSIS — I480 Paroxysmal atrial fibrillation: Secondary | ICD-10-CM

## 2017-04-08 DIAGNOSIS — Z853 Personal history of malignant neoplasm of breast: Secondary | ICD-10-CM | POA: Diagnosis not present

## 2017-04-08 DIAGNOSIS — Q231 Congenital insufficiency of aortic valve: Secondary | ICD-10-CM | POA: Diagnosis not present

## 2017-04-08 DIAGNOSIS — I5032 Chronic diastolic (congestive) heart failure: Secondary | ICD-10-CM | POA: Diagnosis not present

## 2017-04-08 DIAGNOSIS — Z9013 Acquired absence of bilateral breasts and nipples: Secondary | ICD-10-CM | POA: Insufficient documentation

## 2017-04-08 DIAGNOSIS — Z950 Presence of cardiac pacemaker: Secondary | ICD-10-CM | POA: Diagnosis not present

## 2017-04-08 DIAGNOSIS — Z794 Long term (current) use of insulin: Secondary | ICD-10-CM | POA: Diagnosis not present

## 2017-04-08 DIAGNOSIS — K3184 Gastroparesis: Secondary | ICD-10-CM | POA: Insufficient documentation

## 2017-04-08 DIAGNOSIS — N179 Acute kidney failure, unspecified: Secondary | ICD-10-CM

## 2017-04-08 LAB — BASIC METABOLIC PANEL
ANION GAP: 9 (ref 5–15)
BUN: 27 mg/dL — ABNORMAL HIGH (ref 6–20)
CALCIUM: 9.6 mg/dL (ref 8.9–10.3)
CO2: 30 mmol/L (ref 22–32)
CREATININE: 1.19 mg/dL — AB (ref 0.44–1.00)
Chloride: 101 mmol/L (ref 101–111)
GFR, EST AFRICAN AMERICAN: 55 mL/min — AB (ref >60)
GFR, EST NON AFRICAN AMERICAN: 48 mL/min — AB (ref >60)
Glucose, Bld: 168 mg/dL — ABNORMAL HIGH (ref 65–99)
Potassium: 3.7 mmol/L (ref 3.5–5.1)
Sodium: 140 mmol/L (ref 135–145)

## 2017-04-08 MED ORDER — TORSEMIDE 20 MG PO TABS: 60.0000 mg | ORAL_TABLET | Freq: Two times a day (BID) | ORAL | 6 refills | Status: DC

## 2017-04-08 NOTE — Progress Notes (Signed)
Patient ID: [name] [name], female   DOB: 1950-08-17, 66 y.o.   MRN: [id number] PCP: Dr. Hilma Favors Cardiology: Dr. Aundra Dubin  66 yo with history of CAD s/p CABG in 12/05 and redo in 1/11, bioprosthetic AVR, diastolic CHF, and sick sinus syndrome s/p PCM presents for cardiology followup.  She had initial CABG with SVG-RCA in 12/05 followed by redo SVG-RCA and bioprosthetic AVR in 1/11.  She has had diastolic CHF and is on Lasix.  In 9/14, she had a CPX test.  This showed severe functional limitation with ischemic ECG changes (inferolateral ST depression and ST elevation in V1 and V2). She had chest pain.  She had R/LHC in 9/14.  This showed elevated left and right heart filling pressures, patent SVG-PDA, and 80% distal LM/80% ostial LAD stenosis.  She was not a candidate for redo CABG (had 2 prior sternotomies as well as chest radiation for Hodgkins lymphoma).  She had DES from left main into proximal LAD and will need long-term DAPT => Brilinta was used as she has been a poor responder to Plavix in the past.  She re-developed angina in 11/15 and was admitted with unstable angina.  LHC showed 80% ostial LM and patent SVG-RCA. She had DES to ostial LM. Last echo in 1/18 showed EF 60-65%, normal bioprosthetic aortic valve, mild-moderate MR, PASP 35 mmHg.   She has chronic iron deficiency anemia thought to be due to a slow GI bleed.  Last EGD was unremarkable and a capsule endoscopy was also unremarkable.  She has had periodic transfusions; However, recently hemoglobin has been stable.    She had bilateral mastectomy for DCIS in 38/75 without complication.  She has an insulin pump followed by an endocrinologist at [facility].   Given myalgias with statins, she was started on Praluent.  LDL is now excellent.    In the past she has had short runs of atrial fibrillation.  She is not anticoagulated.  Planned for Watchman placement, but given her nickel allergy, we were unable to place a Watchman.    Cardiolite in 2/18  showed no ischemia or infarction.     Diagnosed with OSA, now on CPAP.   Returns today for 2 week follow up of CHF. Last visit torsemide was held for 2 days for AKI and volume depletion. She describes 2 episodes of presyncope in the past week and a half. One was when she walked in the heat across a parking lot and one when she had walked from her car to a hair salon which was quite a long walk but it was not hot outside. She denies chest pain. Has felt SOB with walking from her bedroom to her laundry room, has to stop and take a break. Taking all medications. Weight at home is up today to 118. Weights have been 111-113 pounds in the past 3 weeks. Denies palpitations.   Labs (7/14): hemoglobin 11 Labs (9/14): K 3.9, creatinine 1.48, hemoglobin 7.3 Labs (10/14): hemoglobin 7.7 Labs (11/14): K 3.8, creatinine 1.5 Labs (12/14): HCT 37.7 Labs (1/15): LDL 106, HDL 41, K 3.9, creatinine 1.6, LFTs normal Labs (2/15): K 3.7, creatinine 1.1, BNP 71 Labs (3/15): HCT 36.7 Labs (4/15): K 3.7, creatinine 1.1, HCT 35.5 Labs (7/15): HCT 36.4 Labs (11/15): HCT 32.1, K 4.2, creatinine 1.06 Labs (12/15): hgb 12 Labs (1/16): HCT 37.1 Labs (3/16): LDL 25, HDL 42, elevated transaminases Labs (6/16): HCT 39.6, plts 146 Labs (7/16): LDL 59, HDL 41, AST 34, ALT 49, AP 125 Labs (11/16): HCT  38.8, alkaline phosphatase 156, AST/ALT normal, K 4, creatinine 1.0 Labs (3/17): K 4.4, creatinine 1.4, LFTs normal, hgb 12.5 Labs (5/17): HCT 41.8, K 4, creatinine 1.3 Labs (9/17): K 4, creatinine 0.43, hgb 13.6 Labs (10/17): LDL 42, HDL 43 Labs (1/18): K 4.3, creatinine 1.1 => 1.19, AST 66, ALT 67, hgb 14.2, BNP 72 Labs (2/18): K 3.9, creatinine 1.2, BNP 49.6, HCT 42.1 Labs (4/18): hgb 13.8 Labs (5/18): HCT 41.7, plts 98, K 4.3, creatinine 1.11, BNP 180 Labs (7/18): hgb 12, plts 110, K 4, creatinine 1.23, AST 38, ALT 44 Labs (8/18): K 5, creatinine 1.17 => 2.1, BUN 33 => 92, BNP 134 Labs (9/18): K 3.6, creatinine 1.42.  Hgb 14  PMH: 1. Sick sinus syndrome s/p [employer] PCM.  2. HTN 3. H/o SBO 4. Renal cell carcinoma s/p right nephrectomy in 2008.  5. 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  Fibroscan in 11/14 showed advanced fibrosis concerning for cirrhosis. EGD in 11/14 showed no varices.  6. Diastolic CHF: Echo (1/82) with EF 60-65%, bioprosthetic aortic valve with mean gradient 15 mmHg, mild MR.  CPX (9/14): peak VO2 10.4, VE/VCO2 43.8, inferolateral ST depression and ST elevation in V1/V2 with severe dyspnea and chest pain, severe functional limitation with ischemic changes.  Echo (9/14) with EF 60-65%, normal RV size and systolic function, mild-moderate MR, bioprosthetic aortic valve normal, PA systolic pressure 51 mmHg, no evidence for pericardial constriction though exam incomplete for constriction.  Echo (11/15) with EF 55-60%, bioprosthetic aortic valve, mild-moderate MR, moderate TR, PA systolic pressure 42 mmHg.  - TEE (5/17) with EF 60-65%, normal bioprosthetic aortic valve with mean gradient 13 mmHg, normal RV size and systolic function. - Echo (1/18) with EF 60-65%, normal bioprosthetic aortic valve, mild-moderate MR, PASP 35 mmHg.  7. TAH-BSO 8. Type II diabetes: Has insulin pump.  9. H/o ischemic colitis. 10. H/o PUD. 11. Diabetic gastroparesis. 12. Hyperlipidemia: Myalgias with Crestor and atorvastatin, elevated LFTs with simvastatin.  Myalgias with > 10 mg pravastatin. Now on Praluent.  13. CKD 14. Bicuspid aortic valve s/p 19 mm Edwards pericardial valve in 1/11 (had post-operative Dresslers syndrome).   15. CAD: CABG with SVG-RCA in 12/05.  Redo SVG-RCA in 1/11 with AVR.  LHC/RHC (9/14) with mean RA 18, PA 62/25 mean 43, mean PCWP 26, CI 2.8, 70-80% distal LM stenosis, 80% ostial LAD stenosis, total occlusion RCA, SVG-PDA patent.  Patient had DES LM into LAD.  Plan to continue long-term ASA/Brilinta (poor responder to Plavix).  ETT-cardiolite (4/15) with 6' exercise, EF 60%, ST depression in  recovery and mild chest pain, no ischemia or infarction by perfusion images.  Unstable angina 11/15 with 80% LM, patent SVG-RCA => DES to ostial LM.   - Lexiscan Cardiolite (2/18): EF 62%, no ischemia or infarction (normal).  She had a significant reaction to [place] and probably should not get again.  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  17. Anemia: Iron-deficiency.  Suspect chronic GI blood loss.  EGD in 9/14 was unremarkable.  Capsule endoscopy 10/14 was unremarkable.  Has required periodic blood transfusions.  18. Atrial fibrillation: Paroxysmal, noted by [facility] interrogation.  19. Carotid stenosis: Carotid dopplers (2/15) with 40-59% bilateral stenosis. Carotid dopplers (3/16) with 40-59% bilateral ICA stenosis. Carotid dopplers (3/17) with 40-59% bilateral ICA stenosis.  - Carotid dopplers (3/18) with mild BICA stenosis.  20. 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  22. C difficile colitis 2017 23. Gout 24. ?TIA: head CT negative.  25. OSA: Moderate, uses CPAP.  26. Chronic thrombocytopenia  SH: Married, lives in [place], nonsmoker.   FH: CAD  ROS: All systems reviewed and negative except as per HPI.   Current Outpatient Prescriptions  Medication Sig Dispense Refill  . Alirocumab (PRALUENT [place]) Inject 150 mLs into the skin every 14 (fourteen) days.    . ALPRAZolam (XANAX) 0.5 MG tablet Take 0.5 mg by mouth at bedtime. May take additional dose daily for anxiety    . amLODipine (NORVASC) 5 MG tablet Take 0.5 tablets (2.5 mg total) by mouth daily. 45 tablet 3  . APIDRA 100 UNIT/ML injection Inject 46 Units into the skin as directed. INFUSE THROUGH INSULIN PUMP UTD::[place] 46.15 units daily, Bolus depends on carb intake  2  . aspirin EC 81 MG tablet Take 81 mg by mouth at bedtime.    Marland Kitchen azithromycin (ZITHROMAX) 500 MG  tablet 1 tablet by mouth one hour prior to dental appointment/procedure 2 tablet 0  . Calcium Carbonate-Vitamin D (RA CALCIUM PLUS VITAMIN D) 600-400 MG-UNIT per tablet Take 1 tablet by mouth daily.     . Cholecalciferol 2000 UNITS TABS Take 1 tablet by mouth every morning.     . clobetasol (TEMOVATE) 0.05 % external solution Apply 1 application topically daily as needed (for hives). Reported on 12/05/2015    . Coenzyme Q10 (CO Q 10 PO) Take 1 tablet by mouth at bedtime.    . colchicine 0.6 MG tablet Take 0.6 mg by mouth at bedtime. And,Take 1 tablet at onset and repeate in 1 hour and then 1 daily x 2 (FOR FLARES)     . Fe Fum-FA-B Cmp-C-Zn-Mg-Mn-Cu (HEMATINIC PLUS COMPLEX) 106-1 MG TABS Take 1 tablet by mouth every Monday, Wednesday, and Friday. Take 1 tablet oraly three times a week or as directed daily with with a Vitamin C tablet 90 each 0  . glucagon (GLUCAGON EMERGENCY) 1 MG injection Inject 1 mg into the vein once as needed (for severe reaction). Reported on 12/05/2015    . isosorbide mononitrate (IMDUR) 60 MG 24 hr tablet TAKE 1 TABLET BY MOUTH ONCE DAILY 90 tablet 3  . latanoprost (XALATAN) 0.005 % ophthalmic solution Place 1 drop into both eyes at bedtime.    . Magnesium Oxide 250 MG TABS Take 250 mg by mouth at bedtime.     . metolazone (ZAROXOLYN) 2.5 MG tablet Take 2.5 mg (1 tab) once as directed by CHF clinic. Call before taking [phone] 3 tablet 0  . metoprolol (LOPRESSOR) 50 MG tablet TAKE 1 AND 1/2 TABLETS(75 MG) BY MOUTH TWICE DAILY 270 tablet 3  . Multiple Vitamins-Minerals (MULTIVITAMIN WITH MINERALS) tablet Take 1 tablet by mouth every morning.     . nitroGLYCERIN (NITROLINGUAL) 0.4 MG/SPRAY spray Place 1 spray under the tongue every 5 (five) minutes as needed for chest pain. 12 g 1  . omeprazole (PRILOSEC) 20 MG capsule Take 20 mg by mouth daily as needed. TAKES IN THE EVENING    . pantoprazole (PROTONIX) 40 MG tablet Take 1 tablet (40 mg total) by mouth daily. 30 tablet 11   . Polyethyl Glycol-Propyl Glycol (SYSTANE) 0.4-0.3 % SOLN Apply 1 drop to eye daily as needed (dry eyes).    . potassium chloride (MICRO-K) 10 MEQ CR capsule Take 4 capsules (40 mEq total) by mouth 2 (two) times daily. 240 capsule 3  . pravastatin (PRAVACHOL) 10 MG tablet TAKE 1 TABLET  BY MOUTH ONCE DAILY 90 tablet 3  . ticagrelor (BRILINTA) 60 MG TABS tablet TAKE 1 TABLET(60 MG) BY MOUTH TWICE DAILY 180 tablet 2  . torsemide (DEMADEX) 20 MG tablet Take 60 mg (3 tabs) in am and 40 mg (2 tabs) in pm 150 tablet 6  . triamcinolone cream (KENALOG) 0.5 % Apply 1 application topically daily as needed (inflamation).     Marland Kitchen ULORIC 40 MG tablet Take 40 mg by mouth at bedtime.   11   No current facility-administered medications for this encounter.     BP (!) 142/62   Pulse 82   Wt 118 lb (53.5 kg)   SpO2 98%   BMI 20.90 kg/m  General: NAD.  Neck: Supple, JVP to jaw. No thyromegaly or thyroid nodule.  Lungs: Clear bilaterally. Normal effort.  CV: PMI non displaced. Heart regular, widely split S2. 2/6 SEM RUSB. Nondisplaced PMI. No edema.  No carotid bruit.  Normal pedal pulses.  Abdomen: Soft, non tender, mildly distended.  Skin:Intact without lesions or rashes.  Neurologic: Alert and oriented x 3.  Psych: Normal affect.  Extremities: No clubbing or cyanosis.  HEENT: Normal.   Assessment/Plan: 1. CAD: s/p SVG-RCA in 12/05, then redo SVG-RCA in 1/11--> distal left main/proximal LAD severe stenosis in 9/14.  PCI with DES to distal LM/proximal LAD was done rather than bypass as she would have been a poor candidate for redo sternotomy (2 prior sternotomies and history of chest wall radiation for Hodgkins disease).  Recurrent unstable angina with 80% ostial LM treated with DES in 11/15.  Recent Lexiscan Cardiolite in 2/18 showed normal EF, no ischemia/infarction.   - Her anginal equivalent in the past has been syncope. Discussed with Dr. Aundra Dubin, will arrange exercise myoview. With Lexiscan Myoview in  08/2016 she had to get theophylline, as she had persistent tachycardia. Will avoid that and try treadmill, if she cannot tolerate or get HR up will need to do R/L heart cath.  - Continue Brilinta 60 mg BID. She is a poor responder to Plavix. Will need to be on Brilinta long term given left main disease.  - Continue ASA 81 mg, statin and metoprolol - Continue Imdur.    2. Bioprosthetic aortic valve replacement: In setting of bicuspid aortic valve, thoracic aorta not dilated on 2017 TEE.   - Stable on Echo in 07/2016.   3. Chronic diastolic CHF: Recent echo unchanged compared to the past. - NYHA III - Appears volume overloaded on exam, increase torsemide to 60 mg BID.  - BMET today and again next week.  Arlyce Harman was stopped with AKI. May be able to restart next visit.   4. AKI on CKD:  Patient has a single kidney s/p right nephrectomy.   - BMET today and again next week after increased torsemide.   5. Hyperlipidemia: She has not been able to tolerate any higher potency statin than pravastatin 10 mg daily.   - Continue Praluent   6. Anemia: Suspected slow GI bleed.  Unremarkable EGD and capsule endoscopy in fall 2014.   - Recent Hgb 14.   7. Atrial fibrillation: Paroxysmal, brief episodes noted on PCM interrogation.  - Interrogate PPM today.  - Per Dr. Aundra Dubin - not a good candidate for anticoagulation with history of GI bleed and need for Brilinta.  - If she needs and antiarrhythmic consider Tikosyn or Sotalol.  - We were unable to place Watchman device due to nickel allergy.    8. Carotid bruit:  - Mild  stenosis in 09/2016  9. SSS: Pacemaker in place.   10. HTN:  - controlled. Continue current regimen.   11. OSA:  - Continue CPAP hs.    12. ?TIA: Transient dysarthria in 3/18, could have been afib-related TIA.   - No recurrent symptoms.   13. Thrombocytopenia: Chronic.    - Follows with hematology.   14. Nausea:  - Improved with Protonix - Will see GI next week, Dr. Lacinda Axon at  [facility].   Follow up in one month.   Arbutus Leas 04/08/2017  Patient seen with NP, agree with the above note. I performed an independent exam and adjusted the note above where needed.   She had 2 episodes of presyncope, both when walking outside for a relatively long distance, at least once in significant heat. Possible orthostasis.  I reviewed her PPM interrogation today => no ventricular high rate events.  Minimal atrial fibrillation.  Cannot explain symptoms from arrhythmia.   She continues to have significant dyspnea with exertion.  Torsemide cut back with recent AKI but now weight is up and she looks volume overloaded on exam.  - Increase torsemide back to 60 mg bid, follow BMET closely.   Ongoing exertional dyspnea, presyncope of uncertain etiology.  She says that the presyncope was a prior sign for significant CAD.  I think a right/left heart cath would be helpful here given her worsening trajectory so far this year, but she has baseline CKD as well as a single functional kidney, so we would ideally avoid giving her contrast.   - We discussed proceeding with ETT-Cardiolite. If any abnormalities, will plan on cath.   [name] 04/09/2017

## 2017-04-08 NOTE — Patient Instructions (Signed)
INCREASE Torsemide to 60 mg ( 3 tabs) twice a day  Labs today We will only contact you if something comes back abnormal or we need to make some changes. Otherwise no news is good news!  Labs needed again in 1 week  Your physician has requested that you have en exercise stress myoview. For further information please visit [email or web address]. Please follow instruction sheet, as given.  Your physician recommends that you schedule a follow-up appointment in: 1 month with [name]  Do the following things EVERYDAY: 1) Weigh yourself in the morning before breakfast. Write it down and keep it in a log. 2) Take your medicines as prescribed 3) Eat low salt foods-Limit salt (sodium) to 2000 mg per day.  4) Stay as active as you can everyday 5) Limit all fluids for the day to less than 2 liters

## 2017-04-10 ENCOUNTER — Other Ambulatory Visit: Payer: Self-pay | Admitting: Internal Medicine

## 2017-04-11 ENCOUNTER — Telehealth (HOSPITAL_COMMUNITY): Payer: Self-pay | Admitting: *Deleted

## 2017-04-11 NOTE — Telephone Encounter (Signed)
Patient given detailed instructions per Myocardial Perfusion Study Information Sheet for the test on 04/16/17. Patient notified to arrive 15 minutes early and that it is imperative to arrive on time for appointment to keep from having the test rescheduled.  If you need to cancel or reschedule your appointment, please call the office within 24 hours of your appointment. . Patient verbalized understanding. Kirstie Peri

## 2017-04-16 ENCOUNTER — Other Ambulatory Visit: Payer: Medicare Other

## 2017-04-16 ENCOUNTER — Other Ambulatory Visit (HOSPITAL_COMMUNITY): Payer: Self-pay | Admitting: *Deleted

## 2017-04-16 ENCOUNTER — Telehealth (HOSPITAL_COMMUNITY): Payer: Self-pay | Admitting: *Deleted

## 2017-04-16 ENCOUNTER — Other Ambulatory Visit (HOSPITAL_COMMUNITY): Payer: Medicare Other

## 2017-04-16 ENCOUNTER — Ambulatory Visit (HOSPITAL_COMMUNITY): Payer: Medicare Other | Attending: Cardiology

## 2017-04-16 DIAGNOSIS — E119 Type 2 diabetes mellitus without complications: Secondary | ICD-10-CM | POA: Diagnosis not present

## 2017-04-16 DIAGNOSIS — I5022 Chronic systolic (congestive) heart failure: Secondary | ICD-10-CM

## 2017-04-16 DIAGNOSIS — I200 Unstable angina: Secondary | ICD-10-CM

## 2017-04-16 DIAGNOSIS — I10 Essential (primary) hypertension: Secondary | ICD-10-CM | POA: Diagnosis not present

## 2017-04-16 DIAGNOSIS — R55 Syncope and collapse: Secondary | ICD-10-CM | POA: Insufficient documentation

## 2017-04-16 DIAGNOSIS — I4891 Unspecified atrial fibrillation: Secondary | ICD-10-CM | POA: Insufficient documentation

## 2017-04-16 DIAGNOSIS — I2510 Atherosclerotic heart disease of native coronary artery without angina pectoris: Secondary | ICD-10-CM | POA: Insufficient documentation

## 2017-04-16 DIAGNOSIS — R9439 Abnormal result of other cardiovascular function study: Secondary | ICD-10-CM

## 2017-04-16 DIAGNOSIS — E785 Hyperlipidemia, unspecified: Secondary | ICD-10-CM | POA: Insufficient documentation

## 2017-04-16 DIAGNOSIS — I7789 Other specified disorders of arteries and arterioles: Secondary | ICD-10-CM | POA: Diagnosis not present

## 2017-04-16 LAB — MYOCARDIAL PERFUSION IMAGING
CSEPPHR: 111 {beats}/min
LVDIAVOL: 49 mL (ref 46–106)
LVSYSVOL: 14 mL
RATE: 0.31
Rest HR: 71 {beats}/min
SDS: 8
SRS: 21
SSS: 29
TID: 0.92

## 2017-04-16 LAB — BASIC METABOLIC PANEL
BUN / CREAT RATIO: 26 (ref 12–28)
BUN: 30 mg/dL — ABNORMAL HIGH (ref 8–27)
CALCIUM: 9.8 mg/dL (ref 8.7–10.3)
CHLORIDE: 99 mmol/L (ref 96–106)
CO2: 26 mmol/L (ref 20–29)
CREATININE: 1.17 mg/dL — AB (ref 0.57–1.00)
GFR calc non Af Amer: 49 mL/min/{1.73_m2} — ABNORMAL LOW (ref >59)
GFR, EST AFRICAN AMERICAN: 57 mL/min/{1.73_m2} — AB (ref >59)
Glucose: 183 mg/dL — ABNORMAL HIGH (ref 65–99)
POTASSIUM: 4.4 mmol/L (ref 3.5–5.2)
SODIUM: 142 mmol/L (ref 134–144)

## 2017-04-16 MED ORDER — PREDNISONE 50 MG PO TABS: ORAL_TABLET | ORAL | 0 refills | Status: DC

## 2017-04-16 MED ORDER — TECHNETIUM TC 99M TETROFOSMIN IV KIT
31.2000 | PACK | Freq: Once | INTRAVENOUS | Status: AC | PRN
  Administered 2017-04-16: 31.2 via INTRAVENOUS
  Filled 2017-04-16: qty 32

## 2017-04-16 MED ORDER — REGADENOSON 0.4 MG/5ML IV SOLN
0.4000 mg | Freq: Once | INTRAVENOUS | Status: AC
  Administered 2017-04-16: 0.4 mg via INTRAVENOUS

## 2017-04-16 MED ORDER — TECHNETIUM TC 99M TETROFOSMIN IV KIT
10.5000 | PACK | Freq: Once | INTRAVENOUS | Status: AC | PRN
  Administered 2017-04-16: 10.5 via INTRAVENOUS
  Filled 2017-04-16: qty 11

## 2017-04-16 MED ORDER — AMINOPHYLLINE 25 MG/ML IV SOLN
75.0000 mg | Freq: Once | INTRAVENOUS | Status: AC
  Administered 2017-04-16: 75 mg via INTRAVENOUS

## 2017-04-16 NOTE — Telephone Encounter (Signed)
Pt had abn myoview today, per [name] needs R/L [facility] on Friday, pt has h/o allergy to iodinated contrast so will need to be premedicated w/prednisone.  Pt is aware and agreeable.  Cath sch for Fri 9/28 at 12:30, pt is aware to take Prednisone 50 mg on thur at 6pm, fri 12 am and fri 6 am, rx sent in.  Pt now has insulin pump and will f/u with endocrinology on what to do for that as she will be npo.  All instructions reviewed w/pt via phone, she verbalizes understanding.

## 2017-04-16 NOTE — Telephone Encounter (Signed)
Lab order placed.

## 2017-04-19 ENCOUNTER — Ambulatory Visit (HOSPITAL_COMMUNITY)
Admission: RE | Disposition: A | Discharge status: Home / Self Care | Payer: Self-pay | Source: Ambulatory Visit | Attending: Cardiology

## 2017-04-19 ENCOUNTER — Ambulatory Visit (HOSPITAL_COMMUNITY)
Admission: RE | Admit: 2017-04-19 | Discharge: 2017-04-19 | Disposition: A | Discharge status: Home / Self Care | Payer: Medicare Other | Source: Ambulatory Visit | Attending: Cardiology | Admitting: Cardiology

## 2017-04-19 DIAGNOSIS — Z9013 Acquired absence of bilateral breasts and nipples: Secondary | ICD-10-CM | POA: Insufficient documentation

## 2017-04-19 DIAGNOSIS — I480 Paroxysmal atrial fibrillation: Secondary | ICD-10-CM | POA: Diagnosis not present

## 2017-04-19 DIAGNOSIS — I5032 Chronic diastolic (congestive) heart failure: Secondary | ICD-10-CM | POA: Insufficient documentation

## 2017-04-19 DIAGNOSIS — Z794 Long term (current) use of insulin: Secondary | ICD-10-CM | POA: Diagnosis not present

## 2017-04-19 DIAGNOSIS — Z953 Presence of xenogenic heart valve: Secondary | ICD-10-CM | POA: Insufficient documentation

## 2017-04-19 DIAGNOSIS — Z8571 Personal history of Hodgkin lymphoma: Secondary | ICD-10-CM | POA: Diagnosis not present

## 2017-04-19 DIAGNOSIS — Z8711 Personal history of peptic ulcer disease: Secondary | ICD-10-CM | POA: Insufficient documentation

## 2017-04-19 DIAGNOSIS — I25110 Atherosclerotic heart disease of native coronary artery with unstable angina pectoris: Secondary | ICD-10-CM | POA: Insufficient documentation

## 2017-04-19 DIAGNOSIS — E785 Hyperlipidemia, unspecified: Secondary | ICD-10-CM | POA: Diagnosis not present

## 2017-04-19 DIAGNOSIS — D509 Iron deficiency anemia, unspecified: Secondary | ICD-10-CM | POA: Insufficient documentation

## 2017-04-19 DIAGNOSIS — E1122 Type 2 diabetes mellitus with diabetic chronic kidney disease: Secondary | ICD-10-CM | POA: Insufficient documentation

## 2017-04-19 DIAGNOSIS — R55 Syncope and collapse: Secondary | ICD-10-CM | POA: Insufficient documentation

## 2017-04-19 DIAGNOSIS — N179 Acute kidney failure, unspecified: Secondary | ICD-10-CM | POA: Diagnosis not present

## 2017-04-19 DIAGNOSIS — Z905 Acquired absence of kidney: Secondary | ICD-10-CM | POA: Diagnosis not present

## 2017-04-19 DIAGNOSIS — Z8619 Personal history of other infectious and parasitic diseases: Secondary | ICD-10-CM | POA: Insufficient documentation

## 2017-04-19 DIAGNOSIS — N189 Chronic kidney disease, unspecified: Secondary | ICD-10-CM | POA: Insufficient documentation

## 2017-04-19 DIAGNOSIS — Z90710 Acquired absence of both cervix and uterus: Secondary | ICD-10-CM | POA: Insufficient documentation

## 2017-04-19 DIAGNOSIS — E1143 Type 2 diabetes mellitus with diabetic autonomic (poly)neuropathy: Secondary | ICD-10-CM | POA: Insufficient documentation

## 2017-04-19 DIAGNOSIS — Z853 Personal history of malignant neoplasm of breast: Secondary | ICD-10-CM | POA: Insufficient documentation

## 2017-04-19 DIAGNOSIS — D696 Thrombocytopenia, unspecified: Secondary | ICD-10-CM | POA: Insufficient documentation

## 2017-04-19 DIAGNOSIS — I130 Hypertensive heart and chronic kidney disease with heart failure and stage 1 through stage 4 chronic kidney disease, or unspecified chronic kidney disease: Secondary | ICD-10-CM | POA: Insufficient documentation

## 2017-04-19 DIAGNOSIS — Z8249 Family history of ischemic heart disease and other diseases of the circulatory system: Secondary | ICD-10-CM | POA: Insufficient documentation

## 2017-04-19 DIAGNOSIS — Z7982 Long term (current) use of aspirin: Secondary | ICD-10-CM | POA: Insufficient documentation

## 2017-04-19 DIAGNOSIS — Z951 Presence of aortocoronary bypass graft: Secondary | ICD-10-CM | POA: Diagnosis not present

## 2017-04-19 DIAGNOSIS — Z955 Presence of coronary angioplasty implant and graft: Secondary | ICD-10-CM | POA: Insufficient documentation

## 2017-04-19 DIAGNOSIS — I200 Unstable angina: Secondary | ICD-10-CM

## 2017-04-19 DIAGNOSIS — Z79899 Other long term (current) drug therapy: Secondary | ICD-10-CM | POA: Insufficient documentation

## 2017-04-19 DIAGNOSIS — G4733 Obstructive sleep apnea (adult) (pediatric): Secondary | ICD-10-CM | POA: Diagnosis not present

## 2017-04-19 DIAGNOSIS — I495 Sick sinus syndrome: Secondary | ICD-10-CM | POA: Insufficient documentation

## 2017-04-19 DIAGNOSIS — I2582 Chronic total occlusion of coronary artery: Secondary | ICD-10-CM | POA: Insufficient documentation

## 2017-04-19 DIAGNOSIS — Z85528 Personal history of other malignant neoplasm of kidney: Secondary | ICD-10-CM | POA: Diagnosis not present

## 2017-04-19 DIAGNOSIS — Z950 Presence of cardiac pacemaker: Secondary | ICD-10-CM | POA: Insufficient documentation

## 2017-04-19 DIAGNOSIS — K3184 Gastroparesis: Secondary | ICD-10-CM | POA: Insufficient documentation

## 2017-04-19 DIAGNOSIS — R0609 Other forms of dyspnea: Secondary | ICD-10-CM | POA: Insufficient documentation

## 2017-04-19 DIAGNOSIS — Z91048 Other nonmedicinal substance allergy status: Secondary | ICD-10-CM | POA: Insufficient documentation

## 2017-04-19 DIAGNOSIS — I2510 Atherosclerotic heart disease of native coronary artery without angina pectoris: Secondary | ICD-10-CM

## 2017-04-19 DIAGNOSIS — R9439 Abnormal result of other cardiovascular function study: Secondary | ICD-10-CM

## 2017-04-19 DIAGNOSIS — Z9641 Presence of insulin pump (external) (internal): Secondary | ICD-10-CM | POA: Insufficient documentation

## 2017-04-19 DIAGNOSIS — Q231 Congenital insufficiency of aortic valve: Secondary | ICD-10-CM | POA: Insufficient documentation

## 2017-04-19 DIAGNOSIS — Z7902 Long term (current) use of antithrombotics/antiplatelets: Secondary | ICD-10-CM | POA: Insufficient documentation

## 2017-04-19 DIAGNOSIS — M109 Gout, unspecified: Secondary | ICD-10-CM | POA: Insufficient documentation

## 2017-04-19 HISTORY — PX: RIGHT HEART CATH AND CORONARY/GRAFT ANGIOGRAPHY: CATH118265

## 2017-04-19 LAB — CBC
HEMATOCRIT: 40.7 % (ref 36.0–46.0)
Hemoglobin: 13.5 g/dL (ref 12.0–15.0)
MCH: 29.3 pg (ref 26.0–34.0)
MCHC: 33.2 g/dL (ref 30.0–36.0)
MCV: 88.3 fL (ref 78.0–100.0)
Platelets: 126 10*3/uL — ABNORMAL LOW (ref 150–400)
RBC: 4.61 MIL/uL (ref 3.87–5.11)
RDW: 14.9 % (ref 11.5–15.5)
WBC: 9.6 10*3/uL (ref 4.0–10.5)

## 2017-04-19 LAB — GLUCOSE, CAPILLARY
GLUCOSE-CAPILLARY: 170 mg/dL — AB (ref 65–99)
GLUCOSE-CAPILLARY: 164 mg/dL — AB (ref 65–99)
GLUCOSE-CAPILLARY: 152 mg/dL — AB (ref 65–99)
Glucose-Capillary: 174 mg/dL — ABNORMAL HIGH (ref 65–99)
Glucose-Capillary: 144 mg/dL — ABNORMAL HIGH (ref 65–99)
Glucose-Capillary: 198 mg/dL — ABNORMAL HIGH (ref 65–99)

## 2017-04-19 LAB — POCT I-STAT 3, VENOUS BLOOD GAS (G3P V)
Acid-Base Excess: 3 mmol/L — ABNORMAL HIGH (ref 0.0–2.0)
Acid-Base Excess: 2 mmol/L (ref 0.0–2.0)
BICARBONATE: 27.3 mmol/L (ref 20.0–28.0)
BICARBONATE: 28.1 mmol/L — AB (ref 20.0–28.0)
O2 Saturation: 78 %
O2 Saturation: 75 %
PCO2 VEN: 42.6 mmHg — AB (ref 44.0–60.0)
PCO2 VEN: 44.6 mmHg (ref 44.0–60.0)
PH VEN: 7.407 (ref 7.250–7.430)
PO2 VEN: 39 mmHg (ref 32.0–45.0)
PO2 VEN: 42 mmHg (ref 32.0–45.0)
TCO2: 29 mmol/L (ref 22–32)
TCO2: 29 mmol/L (ref 22–32)
pH, Ven: 7.414 (ref 7.250–7.430)

## 2017-04-19 LAB — BASIC METABOLIC PANEL
Anion gap: 11 (ref 5–15)
BUN: 36 mg/dL — AB (ref 6–20)
CO2: 27 mmol/L (ref 22–32)
CREATININE: 1.44 mg/dL — AB (ref 0.44–1.00)
Calcium: 10.5 mg/dL — ABNORMAL HIGH (ref 8.9–10.3)
Chloride: 102 mmol/L (ref 101–111)
GFR calc Af Amer: 43 mL/min — ABNORMAL LOW (ref >60)
GFR calc non Af Amer: 37 mL/min — ABNORMAL LOW (ref >60)
GLUCOSE: 180 mg/dL — AB (ref 65–99)
Potassium: 3.6 mmol/L (ref 3.5–5.1)
Sodium: 140 mmol/L (ref 135–145)

## 2017-04-19 LAB — PROTIME-INR
INR: 1.06
PROTHROMBIN TIME: 13.7 s (ref 11.4–15.2)

## 2017-04-19 SURGERY — RIGHT HEART CATH AND CORONARY/GRAFT ANGIOGRAPHY
Anesthesia: LOCAL

## 2017-04-19 MED ORDER — IOPAMIDOL (ISOVUE-370) INJECTION 76%
INTRAVENOUS | Status: DC | PRN
  Administered 2017-04-19: 115 mL via INTRAVENOUS

## 2017-04-19 MED ORDER — LIDOCAINE HCL (PF) 1 % IJ SOLN
INTRAMUSCULAR | Status: DC | PRN
  Administered 2017-04-19: 2 mL
  Administered 2017-04-19: 12 mL

## 2017-04-19 MED ORDER — MIDAZOLAM HCL 2 MG/2ML IJ SOLN
INTRAMUSCULAR | Status: DC | PRN
  Administered 2017-04-19 (×2): 1 mg via INTRAVENOUS

## 2017-04-19 MED ORDER — ONDANSETRON HCL 4 MG/2ML IJ SOLN: 4.0000 mg | Freq: Four times a day (QID) | INTRAMUSCULAR | Status: DC | PRN

## 2017-04-19 MED ORDER — SODIUM CHLORIDE 0.9 % IV SOLN: 250.0000 mL | INTRAVENOUS | Status: DC | PRN

## 2017-04-19 MED ORDER — SODIUM CHLORIDE 0.9% FLUSH: 3.0000 mL | INTRAVENOUS | Status: DC | PRN

## 2017-04-19 MED ORDER — TICAGRELOR 90 MG PO TABS
ORAL_TABLET | ORAL | Status: AC
  Filled 2017-04-19: qty 1

## 2017-04-19 MED ORDER — IOPAMIDOL (ISOVUE-370) INJECTION 76%
INTRAVENOUS | Status: AC
  Filled 2017-04-19: qty 50

## 2017-04-19 MED ORDER — LIDOCAINE HCL 2 % IJ SOLN
INTRAMUSCULAR | Status: AC
  Filled 2017-04-19: qty 10

## 2017-04-19 MED ORDER — DIPHENHYDRAMINE HCL 50 MG/ML IJ SOLN: 50.0000 mg | Freq: Once | INTRAMUSCULAR | Status: AC

## 2017-04-19 MED ORDER — SODIUM CHLORIDE 0.9 % IV SOLN
INTRAVENOUS | Status: DC
  Administered 2017-04-19: 12:00:00 via INTRAVENOUS

## 2017-04-19 MED ORDER — HEPARIN (PORCINE) IN NACL 2-0.9 UNIT/ML-% IJ SOLN
INTRAMUSCULAR | Status: AC
  Filled 2017-04-19: qty 1000

## 2017-04-19 MED ORDER — MIDAZOLAM HCL 2 MG/2ML IJ SOLN
INTRAMUSCULAR | Status: AC
  Filled 2017-04-19: qty 2

## 2017-04-19 MED ORDER — IOPAMIDOL (ISOVUE-370) INJECTION 76%
INTRAVENOUS | Status: AC
  Filled 2017-04-19: qty 100

## 2017-04-19 MED ORDER — FENTANYL CITRATE (PF) 100 MCG/2ML IJ SOLN
INTRAMUSCULAR | Status: AC
  Filled 2017-04-19: qty 2

## 2017-04-19 MED ORDER — ASPIRIN 81 MG PO CHEW
81.0000 mg | CHEWABLE_TABLET | ORAL | Status: AC
  Administered 2017-04-19: 81 mg via ORAL

## 2017-04-19 MED ORDER — SODIUM CHLORIDE 0.9 % IV SOLN: INTRAVENOUS | Status: AC

## 2017-04-19 MED ORDER — ASPIRIN 81 MG PO CHEW
CHEWABLE_TABLET | ORAL | Status: AC
  Administered 2017-04-19: 81 mg via ORAL
  Filled 2017-04-19: qty 1

## 2017-04-19 MED ORDER — FENTANYL CITRATE (PF) 100 MCG/2ML IJ SOLN
INTRAMUSCULAR | Status: DC | PRN
  Administered 2017-04-19 (×2): 25 ug via INTRAVENOUS

## 2017-04-19 MED ORDER — DIPHENHYDRAMINE HCL 25 MG PO CAPS
50.0000 mg | ORAL_CAPSULE | Freq: Once | ORAL | Status: AC
  Administered 2017-04-19: 50 mg via ORAL
  Filled 2017-04-19: qty 2

## 2017-04-19 MED ORDER — ACETAMINOPHEN 325 MG PO TABS: 650.0000 mg | ORAL_TABLET | ORAL | Status: DC | PRN

## 2017-04-19 MED ORDER — SODIUM CHLORIDE 0.9% FLUSH: 3.0000 mL | Freq: Two times a day (BID) | INTRAVENOUS | Status: DC

## 2017-04-19 MED ORDER — HEPARIN (PORCINE) IN NACL 2-0.9 UNIT/ML-% IJ SOLN
INTRAMUSCULAR | Status: AC | PRN
  Administered 2017-04-19: 1000 mL

## 2017-04-19 SURGICAL SUPPLY — 13 items
CATH BALLN WEDGE 5F 110CM (CATHETERS) ×2 IMPLANT
CATH INFINITI 5 FR AR1 MOD (CATHETERS) ×2 IMPLANT
CATH INFINITI 5FR MULTPACK ANG (CATHETERS) ×2 IMPLANT
CATH LAUNCHER 5F JL3 (CATHETERS) ×1 IMPLANT
CATHETER LAUNCHER 5F JL3 (CATHETERS) ×2
GUIDEWIRE INQWIRE 1.5J.035X260 (WIRE) ×1 IMPLANT
INQWIRE 1.5J .035X260CM (WIRE) ×2
KIT HEART LEFT (KITS) ×2 IMPLANT
PACK CARDIAC CATHETERIZATION (CUSTOM PROCEDURE TRAY) ×2 IMPLANT
SHEATH GLIDE SLENDER 4/5FR (SHEATH) ×4 IMPLANT
SHEATH PINNACLE 5F 10CM (SHEATH) ×2 IMPLANT
TRANSDUCER W/STOPCOCK (MISCELLANEOUS) ×2 IMPLANT
TUBING CIL FLEX 10 FLL-RA (TUBING) ×2 IMPLANT

## 2017-04-19 NOTE — Progress Notes (Signed)
Per Rennis Harding, RN do not give pt Brilinta pre cath but give 81 mg aspirin.

## 2017-04-19 NOTE — H&P (View-Only) (Signed)
Patient ID: [name] [name], female   DOB: [birth year], 66 y.o.   MRN: [id number] PCP: Dr. Hilma Favors Cardiology: Dr. Aundra Dubin  66 yo with history of CAD s/p CABG in 12/05 and redo in 1/11, bioprosthetic AVR, diastolic CHF, and sick sinus syndrome s/p PCM presents for cardiology followup.  She had initial CABG with SVG-RCA in 12/05 followed by redo SVG-RCA and bioprosthetic AVR in 1/11.  She has had diastolic CHF and is on Lasix.  In 9/14, she had a CPX test.  This showed severe functional limitation with ischemic ECG changes (inferolateral ST depression and ST elevation in V1 and V2). She had chest pain.  She had R/LHC in 9/14.  This showed elevated left and right heart filling pressures, patent SVG-PDA, and 80% distal LM/80% ostial LAD stenosis.  She was not a candidate for redo CABG (had 2 prior sternotomies as well as chest radiation for Hodgkins lymphoma).  She had DES from left main into proximal LAD and will need long-term DAPT => Brilinta was used as she has been a poor responder to Plavix in the past.  She re-developed angina in 11/15 and was admitted with unstable angina.  LHC showed 80% ostial LM and patent SVG-RCA. She had DES to ostial LM. Last echo in 1/18 showed EF 60-65%, normal bioprosthetic aortic valve, mild-moderate MR, PASP 35 mmHg.   She has chronic iron deficiency anemia thought to be due to a slow GI bleed.  Last EGD was unremarkable and a capsule endoscopy was also unremarkable.  She has had periodic transfusions; However, recently hemoglobin has been stable.    She had bilateral mastectomy for DCIS in 79/39 without complication.  She has an insulin pump followed by an endocrinologist at [facility].   Given myalgias with statins, she was started on Praluent.  LDL is now excellent.    In the past she has had short runs of atrial fibrillation.  She is not anticoagulated.  Planned for Watchman placement, but given her nickel allergy, we were unable to place a Watchman.    Cardiolite in 2/18  showed no ischemia or infarction.     Diagnosed with OSA, now on CPAP.   Returns today for 2 week follow up of CHF. Last visit torsemide was held for 2 days for AKI and volume depletion. She describes 2 episodes of presyncope in the past week and a half. One was when she walked in the heat across a parking lot and one when she had walked from her car to a hair salon which was quite a long walk but it was not hot outside. She denies chest pain. Has felt SOB with walking from her bedroom to her laundry room, has to stop and take a break. Taking all medications. Weight at home is up today to 118. Weights have been 111-113 pounds in the past 3 weeks. Denies palpitations.   Labs (7/14): hemoglobin 11 Labs (9/14): K 3.9, creatinine 1.48, hemoglobin 7.3 Labs (10/14): hemoglobin 7.7 Labs (11/14): K 3.8, creatinine 1.5 Labs (12/14): HCT 37.7 Labs (1/15): LDL 106, HDL 41, K 3.9, creatinine 1.6, LFTs normal Labs (2/15): K 3.7, creatinine 1.1, BNP 71 Labs (3/15): HCT 36.7 Labs (4/15): K 3.7, creatinine 1.1, HCT 35.5 Labs (7/15): HCT 36.4 Labs (11/15): HCT 32.1, K 4.2, creatinine 1.06 Labs (12/15): hgb 12 Labs (1/16): HCT 37.1 Labs (3/16): LDL 25, HDL 42, elevated transaminases Labs (6/16): HCT 39.6, plts 146 Labs (7/16): LDL 59, HDL 41, AST 34, ALT 49, AP 125 Labs (11/16): HCT  38.8, alkaline phosphatase 156, AST/ALT normal, K 4, creatinine 1.0 Labs (3/17): K 4.4, creatinine 1.4, LFTs normal, hgb 12.5 Labs (5/17): HCT 41.8, K 4, creatinine 1.3 Labs (9/17): K 4, creatinine 0.43, hgb 13.6 Labs (10/17): LDL 42, HDL 43 Labs (1/18): K 4.3, creatinine 1.1 => 1.19, AST 66, ALT 67, hgb 14.2, BNP 72 Labs (2/18): K 3.9, creatinine 1.2, BNP 49.6, HCT 42.1 Labs (4/18): hgb 13.8 Labs (5/18): HCT 41.7, plts 98, K 4.3, creatinine 1.11, BNP 180 Labs (7/18): hgb 12, plts 110, K 4, creatinine 1.23, AST 38, ALT 44 Labs (8/18): K 5, creatinine 1.17 => 2.1, BUN 33 => 92, BNP 134 Labs (9/18): K 3.6, creatinine 1.42.  Hgb 14  PMH: 1. Sick sinus syndrome s/p [employer] PCM.  2. HTN 3. H/o SBO 4. Renal cell carcinoma s/p right nephrectomy in 2008.  5. 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  Fibroscan in 11/14 showed advanced fibrosis concerning for cirrhosis. EGD in 11/14 showed no varices.  6. Diastolic CHF: Echo (0/96) with EF 60-65%, bioprosthetic aortic valve with mean gradient 15 mmHg, mild MR.  CPX (9/14): peak VO2 10.4, VE/VCO2 43.8, inferolateral ST depression and ST elevation in V1/V2 with severe dyspnea and chest pain, severe functional limitation with ischemic changes.  Echo (9/14) with EF 60-65%, normal RV size and systolic function, mild-moderate MR, bioprosthetic aortic valve normal, PA systolic pressure 51 mmHg, no evidence for pericardial constriction though exam incomplete for constriction.  Echo (11/15) with EF 55-60%, bioprosthetic aortic valve, mild-moderate MR, moderate TR, PA systolic pressure 42 mmHg.  - TEE (5/17) with EF 60-65%, normal bioprosthetic aortic valve with mean gradient 13 mmHg, normal RV size and systolic function. - Echo (1/18) with EF 60-65%, normal bioprosthetic aortic valve, mild-moderate MR, PASP 35 mmHg.  7. TAH-BSO 8. Type II diabetes: Has insulin pump.  9. H/o ischemic colitis. 10. H/o PUD. 11. Diabetic gastroparesis. 12. Hyperlipidemia: Myalgias with Crestor and atorvastatin, elevated LFTs with simvastatin.  Myalgias with > 10 mg pravastatin. Now on Praluent.  13. CKD 14. Bicuspid aortic valve s/p 19 mm Edwards pericardial valve in 1/11 (had post-operative Dresslers syndrome).   15. CAD: CABG with SVG-RCA in 12/05.  Redo SVG-RCA in 1/11 with AVR.  LHC/RHC (9/14) with mean RA 18, PA 62/25 mean 43, mean PCWP 26, CI 2.8, 70-80% distal LM stenosis, 80% ostial LAD stenosis, total occlusion RCA, SVG-PDA patent.  Patient had DES LM into LAD.  Plan to continue long-term ASA/Brilinta (poor responder to Plavix).  ETT-cardiolite (4/15) with 6' exercise, EF 60%, ST depression in  recovery and mild chest pain, no ischemia or infarction by perfusion images.  Unstable angina 11/15 with 80% LM, patent SVG-RCA => DES to ostial LM.   - Lexiscan Cardiolite (2/18): EF 62%, no ischemia or infarction (normal).  She had a significant reaction to [facility] and probably should not get again.  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  17. Anemia: Iron-deficiency.  Suspect chronic GI blood loss.  EGD in 9/14 was unremarkable.  Capsule endoscopy 10/14 was unremarkable.  Has required periodic blood transfusions.  18. Atrial fibrillation: Paroxysmal, noted by [facility] interrogation.  19. Carotid stenosis: Carotid dopplers (2/15) with 40-59% bilateral stenosis. Carotid dopplers (3/16) with 40-59% bilateral ICA stenosis. Carotid dopplers (3/17) with 40-59% bilateral ICA stenosis.  - Carotid dopplers (3/18) with mild BICA stenosis.  20. 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  22. C difficile colitis 2017 23. Gout 24. ?TIA: head CT negative.  25. OSA: Moderate, uses CPAP.  26. Chronic thrombocytopenia  SH: Married, lives in [place], nonsmoker.   FH: CAD  ROS: All systems reviewed and negative except as per HPI.   Current Outpatient Prescriptions  Medication Sig Dispense Refill  . Alirocumab (PRALUENT Cook) Inject 150 mLs into the skin every 14 (fourteen) days.    . ALPRAZolam (XANAX) 0.5 MG tablet Take 0.5 mg by mouth at bedtime. May take additional dose daily for anxiety    . amLODipine (NORVASC) 5 MG tablet Take 0.5 tablets (2.5 mg total) by mouth daily. 45 tablet 3  . APIDRA 100 UNIT/ML injection Inject 46 Units into the skin as directed. INFUSE THROUGH INSULIN PUMP UTD::Basil 46.15 units daily, Bolus depends on carb intake  2  . aspirin EC 81 MG tablet Take 81 mg by mouth at bedtime.    Marland Kitchen azithromycin (ZITHROMAX) 500 MG  tablet 1 tablet by mouth one hour prior to dental appointment/procedure 2 tablet 0  . Calcium Carbonate-Vitamin D (RA CALCIUM PLUS VITAMIN D) 600-400 MG-UNIT per tablet Take 1 tablet by mouth daily.     . Cholecalciferol 2000 UNITS TABS Take 1 tablet by mouth every morning.     . clobetasol (TEMOVATE) 0.05 % external solution Apply 1 application topically daily as needed (for hives). Reported on 12/05/2015    . Coenzyme Q10 (CO Q 10 PO) Take 1 tablet by mouth at bedtime.    . colchicine 0.6 MG tablet Take 0.6 mg by mouth at bedtime. And,Take 1 tablet at onset and repeate in 1 hour and then 1 daily x 2 (FOR FLARES)     . Fe Fum-FA-B Cmp-C-Zn-Mg-Mn-Cu (HEMATINIC PLUS COMPLEX) 106-1 MG TABS Take 1 tablet by mouth every Monday, Wednesday, and Friday. Take 1 tablet oraly three times a week or as directed daily with with a Vitamin C tablet 90 each 0  . glucagon (GLUCAGON EMERGENCY) 1 MG injection Inject 1 mg into the vein once as needed (for severe reaction). Reported on 12/05/2015    . isosorbide mononitrate (IMDUR) 60 MG 24 hr tablet TAKE 1 TABLET BY MOUTH ONCE DAILY 90 tablet 3  . latanoprost (XALATAN) 0.005 % ophthalmic solution Place 1 drop into both eyes at bedtime.    . Magnesium Oxide 250 MG TABS Take 250 mg by mouth at bedtime.     . metolazone (ZAROXOLYN) 2.5 MG tablet Take 2.5 mg (1 tab) once as directed by CHF clinic. Call before taking [phone] 3 tablet 0  . metoprolol (LOPRESSOR) 50 MG tablet TAKE 1 AND 1/2 TABLETS(75 MG) BY MOUTH TWICE DAILY 270 tablet 3  . Multiple Vitamins-Minerals (MULTIVITAMIN WITH MINERALS) tablet Take 1 tablet by mouth every morning.     . nitroGLYCERIN (NITROLINGUAL) 0.4 MG/SPRAY spray Place 1 spray under the tongue every 5 (five) minutes as needed for chest pain. 12 g 1  . omeprazole (PRILOSEC) 20 MG capsule Take 20 mg by mouth daily as needed. TAKES IN THE EVENING    . pantoprazole (PROTONIX) 40 MG tablet Take 1 tablet (40 mg total) by mouth daily. 30 tablet 11   . Polyethyl Glycol-Propyl Glycol (SYSTANE) 0.4-0.3 % SOLN Apply 1 drop to eye daily as needed (dry eyes).    . potassium chloride (MICRO-K) 10 MEQ CR capsule Take 4 capsules (40 mEq total) by mouth 2 (two) times daily. 240 capsule 3  . pravastatin (PRAVACHOL) 10 MG tablet TAKE 1 TABLET  BY MOUTH ONCE DAILY 90 tablet 3  . ticagrelor (BRILINTA) 60 MG TABS tablet TAKE 1 TABLET(60 MG) BY MOUTH TWICE DAILY 180 tablet 2  . torsemide (DEMADEX) 20 MG tablet Take 60 mg (3 tabs) in am and 40 mg (2 tabs) in pm 150 tablet 6  . triamcinolone cream (KENALOG) 0.5 % Apply 1 application topically daily as needed (inflamation).     Marland Kitchen ULORIC 40 MG tablet Take 40 mg by mouth at bedtime.   11   No current facility-administered medications for this encounter.     BP (!) 142/62   Pulse 82   Wt 118 lb (53.5 kg)   SpO2 98%   BMI 20.90 kg/m  General: NAD.  Neck: Supple, JVP to jaw. No thyromegaly or thyroid nodule.  Lungs: Clear bilaterally. Normal effort.  CV: PMI non displaced. Heart regular, widely split S2. 2/6 SEM RUSB. Nondisplaced PMI. No edema.  No carotid bruit.  Normal pedal pulses.  Abdomen: Soft, non tender, mildly distended.  Skin:Intact without lesions or rashes.  Neurologic: Alert and oriented x 3.  Psych: Normal affect.  Extremities: No clubbing or cyanosis.  HEENT: Normal.   Assessment/Plan: 1. CAD: s/p SVG-RCA in 12/05, then redo SVG-RCA in 1/11--> distal left main/proximal LAD severe stenosis in 9/14.  PCI with DES to distal LM/proximal LAD was done rather than bypass as she would have been a poor candidate for redo sternotomy (2 prior sternotomies and history of chest wall radiation for Hodgkins disease).  Recurrent unstable angina with 80% ostial LM treated with DES in 11/15.  Recent Lexiscan Cardiolite in 2/18 showed normal EF, no ischemia/infarction.   - Her anginal equivalent in the past has been syncope. Discussed with Dr. Aundra Dubin, will arrange exercise myoview. With Lexiscan Myoview in  08/2016 she had to get theophylline, as she had persistent tachycardia. Will avoid that and try treadmill, if she cannot tolerate or get HR up will need to do R/L heart cath.  - Continue Brilinta 60 mg BID. She is a poor responder to Plavix. Will need to be on Brilinta long term given left main disease.  - Continue ASA 81 mg, statin and metoprolol - Continue Imdur.    2. Bioprosthetic aortic valve replacement: In setting of bicuspid aortic valve, thoracic aorta not dilated on 2017 TEE.   - Stable on Echo in 07/2016.   3. Chronic diastolic CHF: Recent echo unchanged compared to the past. - NYHA III - Appears volume overloaded on exam, increase torsemide to 60 mg BID.  - BMET today and again next week.  [name] [name] was stopped with AKI. May be able to restart next visit.   4. AKI on CKD:  Patient has a single kidney s/p right nephrectomy.   - BMET today and again next week after increased torsemide.   5. Hyperlipidemia: She has not been able to tolerate any higher potency statin than pravastatin 10 mg daily.   - Continue Praluent   6. Anemia: Suspected slow GI bleed.  Unremarkable EGD and capsule endoscopy in fall 2014.   - Recent Hgb 14.   7. Atrial fibrillation: Paroxysmal, brief episodes noted on PCM interrogation.  - Interrogate PPM today.  - Per Dr. Aundra Dubin - not a good candidate for anticoagulation with history of GI bleed and need for Brilinta.  - If she needs and antiarrhythmic consider Tikosyn or Sotalol.  - We were unable to place Watchman device due to nickel allergy.    8. Carotid bruit:  - Mild  stenosis in 09/2016  9. SSS: Pacemaker in place.   10. HTN:  - controlled. Continue current regimen.   11. OSA:  - Continue CPAP hs.    12. ?TIA: Transient dysarthria in 3/18, could have been afib-related TIA.   - No recurrent symptoms.   13. Thrombocytopenia: Chronic.    - Follows with hematology.   14. Nausea:  - Improved with Protonix - Will see GI next week, Dr. Lacinda Axon at  [facility].   Follow up in one month.   Arbutus Leas 04/08/2017  Patient seen with NP, agree with the above note. I performed an independent exam and adjusted the note above where needed.   She had 2 episodes of presyncope, both when walking outside for a relatively long distance, at least once in significant heat. Possible orthostasis.  I reviewed her PPM interrogation today => no ventricular high rate events.  Minimal atrial fibrillation.  Cannot explain symptoms from arrhythmia.   She continues to have significant dyspnea with exertion.  Torsemide cut back with recent AKI but now weight is up and she looks volume overloaded on exam.  - Increase torsemide back to 60 mg bid, follow BMET closely.   Ongoing exertional dyspnea, presyncope of uncertain etiology.  She says that the presyncope was a prior sign for significant CAD.  I think a right/left heart cath would be helpful here given her worsening trajectory so far this year, but she has baseline CKD as well as a single functional kidney, so we would ideally avoid giving her contrast.   - We discussed proceeding with ETT-Cardiolite. If any abnormalities, will plan on cath.   [name] 04/09/2017

## 2017-04-19 NOTE — Interval H&P Note (Signed)
History and Physical Interval Note:  04/19/2017 2:23 PM  [name] [name]  has presented today for surgery, with the diagnosis of chest pain  The various methods of treatment have been discussed with the patient and family. After consideration of risks, benefits and other options for treatment, the patient has consented to  Procedure(s): RIGHT/LEFT HEART CATH AND CORONARY/GRAFT ANGIOGRAPHY (N/A) as a surgical intervention .  The patient's history has been reviewed, patient examined, no change in status, stable for surgery.  I have reviewed the patient's chart and labs.  Questions were answered to the patient's satisfaction.     Camala Talwar [employer]

## 2017-04-19 NOTE — Progress Notes (Signed)
Site area: RFA Site Prior to Removal:  Level 0 Pressure Applied For: Manual:  yes  Patient Status During Pull:stable   Post Pull Site:  Level 0 Post Pull Instructions Given: yes  Post Pull Pulses Present:palpable  Dressing Applied:  tegaderm Bedrest begins @  Comments:

## 2017-04-19 NOTE — Progress Notes (Signed)
Client had questions about evening medications for tonight and called [name],PA and client advised per [name],PA that she could resume norvasc,imdur,torsemide tomorrow and resume metoprolol and brillinta tonight

## 2017-04-19 NOTE — Discharge Instructions (Signed)

## 2017-04-19 NOTE — Progress Notes (Signed)
Up and walked and tolerated well; right groin stable no bleeding or hematoma

## 2017-04-19 NOTE — Progress Notes (Signed)
Site area: RFA / Rt AC Site Prior to Removal:  Level 0 / 0 Pressure Applied For:20 min Manual:   yes Patient Status During Pull:  stable Post Pull Site:  Level 0 / 0 Post Pull Instructions Given:  yes Post Pull Pulses Present: palpable Dressing Applied:  tegaderm Bedrest begins @ 1640 till 2040 Comments:

## 2017-04-22 ENCOUNTER — Encounter (HOSPITAL_COMMUNITY): Payer: Self-pay | Admitting: Cardiology

## 2017-04-22 ENCOUNTER — Telehealth (HOSPITAL_COMMUNITY): Payer: Self-pay | Admitting: *Deleted

## 2017-04-22 DIAGNOSIS — I340 Nonrheumatic mitral (valve) insufficiency: Secondary | ICD-10-CM

## 2017-04-22 MED FILL — Lidocaine HCl Local Inj 2%: INTRAMUSCULAR | Qty: 10 | Status: AC

## 2017-04-22 NOTE — Telephone Encounter (Signed)
-----   Message from Larey Dresser, MD sent at 04/19/2017  4:06 PM EDT ----- Please arrange echo for Mrs Husser to assess mitral regurgitation.

## 2017-04-22 NOTE — Telephone Encounter (Signed)
Pt aware, order placed, pt was also instructed to increase Imdur to 120 mg daily after cath, med list updated

## 2017-04-24 ENCOUNTER — Encounter: Payer: Self-pay | Admitting: Cardiology

## 2017-04-24 ENCOUNTER — Encounter (HOSPITAL_COMMUNITY): Payer: Self-pay

## 2017-04-24 ENCOUNTER — Telehealth: Payer: Self-pay | Admitting: *Deleted

## 2017-04-24 ENCOUNTER — Ambulatory Visit (INDEPENDENT_AMBULATORY_CARE_PROVIDER_SITE_OTHER): Payer: Medicare Other | Admitting: Cardiology

## 2017-04-24 VITALS — BP 118/56 | HR 75 | Ht 63.0 in | Wt 116.8 lb

## 2017-04-24 DIAGNOSIS — Z9989 Dependence on other enabling machines and devices: Secondary | ICD-10-CM

## 2017-04-24 DIAGNOSIS — I5032 Chronic diastolic (congestive) heart failure: Secondary | ICD-10-CM | POA: Diagnosis not present

## 2017-04-24 DIAGNOSIS — G4733 Obstructive sleep apnea (adult) (pediatric): Secondary | ICD-10-CM

## 2017-04-24 HISTORY — DX: Obstructive sleep apnea (adult) (pediatric): G47.33

## 2017-04-24 LAB — PROTIME-INR

## 2017-04-24 NOTE — Patient Instructions (Addendum)
Medication Instructions:  Your physician recommends that you continue on your current medications as directed. Please refer to the Current Medication list given to you today.   Labwork: None Testing/Procedures: None   Follow-Up: Your physician wants you to follow-up in: 1 year with Dr Radford Pax. (October 2019).  You will receive a reminder letter in the mail two months in advance. If you don't receive a letter, please call our office to schedule the follow-up appointment.   Any Other Special Instructions Will Be Listed Below (If Applicable).     If you need a refill on your cardiac medications before your next appointment, please call your pharmacy.

## 2017-04-24 NOTE — Progress Notes (Signed)
Cardiology Office Note:    Date:  04/25/2017   ID:  [name] [name], DOB April 24, 1951, MRN [id number]  PCP:  Sharilyn Sites, MD  Cardiologist:  Fransico Him, MD   Referring MD: Sharilyn Sites, MD   Chief Complaint  Patient presents with  . Sleep Apnea    History of Present Illness:    [name] [name] is a 66 y.o. female with a hx of chronic diastolic CHF, CAD and afib who recently was referred for a sleep study by Dr. Aundra Dubin due to excessive daytime sleepiness.  She was found to have moderate OSA with an AHI of 22/hr with significant oxygen desaturations as low as 85%.  She underwent CPAP titration to 18cm H2O.    Past Medical History:  Diagnosis Date  . Anxiety   . Aortic stenosis    a. bicuspid aortic valve; mean gradient of 20 mmHg in 2/10; b. s/p bioprosth AVR (19-mm Edwards pericardial valve) with a redo coronary artery bypass graft procedure in 07/2009; postoperative Dressler's syndrome    . Arthritis   . Carcinoma, renal cell ([place]) 05/2007   Laparoscopic right nephrectomy  . Chronic diastolic CHF (congestive heart failure) ([place])    a. 9.2014 EchoP EF 60-65%, no rwma, bioprosth AVR, mean gradient of 7mHg, mild to mod MR, PASP 559mg.  . CKD (chronic kidney disease), stage II   . Colitis, ischemic (HCPe Ell2008  . Coronary artery disease    a. initial CABG with SVG-RCA in 12/05. b. redo SVG-RCA and bioprosthetic AVR in 1/11. d. 9/14 - PCI with DES to distal LM/proximal LAD was done rather than bypass as she would have been a poor candidate for redo sternotomy. d. S/p DES to prox LM 05/2014.  . Diabetes mellitus 03/2010    TYPE II: Hemoglobin A1c of 7.4 in  03/2010; 8.4 in 06/2010; treated with insulin pump  . Duodenal ulcer    Remote; H. pylori positive  . Gastroparesis due to DM (HCIssaquena10/04/2012  . Genetic testing 01/14/2017   [name] [name] underwent genetic counseling and testing for hereditary cancer syndromes on 01/01/2017. Her results were negative for mutations in all 83  genes analyzed by [facility]'s 83-gene Common Hereditary Cancers Panel. Genes analyzed include: ALK, APC, ATM, AXIN2, BAP1, BARD1, BLM, BMPR1A, BRCA1, BRCA2, BRIP1, CASR, CDC73, CDH1, CDK4, CDKN1B, CDKN1C, CDKN2A, CEBPA, CHEK2, CTNNA1, DICER1, DIS3L2,   . Hodgkin's disease (HCG[place]1991   Mantle radiation therapy  . Hyperlipidemia   . Hyperplastic gastric polyp 06/23/2012  . Hypertension   . Iron deficiency anemia    a. 03/2013 EGD: essentially normal. Possible slow GIB.  . Migraines   . NASH (nonalcoholic steatohepatitis) 1999   -biopsy in 1999  . OSA on CPAP 04/24/2017   Moderate with AHI 22/hr now on CPAP at 18cm H2o  . Osteopenia     hip on DEXA in October 2007.  . Osteoporosis   . PAF (paroxysmal atrial fibrillation) (HCS[place]   a.  h/o brief episodes noted on ppm interrogation. b. given GI bleeding and need to be on both ASA 81 and Brilinta, risks likely outweigh benefits of anticoagulation.   . Small bowel obstruction (HCC)    Recurrent; resolved after resection of a lipoma  . Syncope    a. H/o recurrent syncope with pauses on loop recorder s/p Medtronic pacemaker 07/2012.    Past Surgical History:  Procedure Laterality Date  . AORTIC VALVE REPLACEMENT  2011   Aortic valve replacement surgery with a 19 mm bioprosthetic device  post redo CABG January 2011.  Marland Kitchen BONE MARROW BIOPSY    . BREAST EXCISIONAL BIOPSY     benign, bilateral  . CERVICAL BIOPSY    . COLONOSCOPY     Multiple with adenomatous polyps  . COLONOSCOPY  06/23/2012   Procedure: COLONOSCOPY;  Surgeon: Gatha Mayer, MD;  Location: [facility] ENDOSCOPY;  Service: Endoscopy;  Laterality: N/A;  . CORONARY ARTERY BYPASS GRAFT  2005, 2011   CABG-most recent SVG to RCA-12/05; RCA occluded with patent graft in 2006; Redo bypass in 07/2009  . ESOPHAGOGASTRODUODENOSCOPY    . ESOPHAGOGASTRODUODENOSCOPY  06/23/2012   Procedure: ESOPHAGOGASTRODUODENOSCOPY (EGD);  Surgeon: Gatha Mayer, MD;  Location: [facility];  Service: Endoscopy;   Laterality: N/A;  . ESOPHAGOGASTRODUODENOSCOPY N/A 04/06/2013   Procedure: ESOPHAGOGASTRODUODENOSCOPY (EGD);  Surgeon: Irene Shipper, MD;  Location[facility] ENDOSCOPY;  Service: Endoscopy;  Laterality: N/A;  . EXPLORATORY LAPAROTOMY W/ BOWEL RESECTION     Small bowel resection of lipoma  . GIVENS CAPSULE STUDY N/A 04/30/2013   Procedure: GIVENS CAPSULE STUDY;  Surgeon: Gatha Mayer, MD;  Location: [place];  Service: Endoscopy;  Laterality: N/A;  . LEFT AND RIGHT HEART CATHETERIZATION WITH CORONARY ANGIOGRAM N/A 04/07/2013   Procedure: LEFT AND RIGHT HEART CATHETERIZATION WITH CORONARY ANGIOGRAM;  Surgeon: Larey Dresser, MD;  Location: [facility] CATH LAB;  Service: Cardiovascular;  Laterality: N/A;  . LEFT ATRIAL APPENDAGE OCCLUSION N/A 02/16/2016   Watchman not placed - nickel allergy  . LEFT HEART CATHETERIZATION WITH CORONARY/GRAFT ANGIOGRAM N/A 06/07/2014   Procedure: LEFT HEART CATHETERIZATION WITH Beatrix Fetters;  Surgeon: Burnell Blanks, MD;  Location: [facility] CATH LAB;  Service: Cardiovascular;  Laterality: N/A;  . LIVER BIOPSY    . LOOP RECORDER IMPLANT N/A 12/10/2011   Procedure: LOOP RECORDER IMPLANT;  Surgeon: Evans Lance, MD;  Location: [facility] CATH LAB;  Service: Cardiovascular;  Laterality: N/A;  . MALONEY DILATION  06/23/2012   Procedure: [name] DILATION;  Surgeon: Gatha Mayer, MD;  Location: [facility] ENDOSCOPY;  Service: Endoscopy;;  54 fr  . MASTECTOMY Left 05/17/2015   total   . NEPHRECTOMY  2008   Laparoscopic right nephrectomy, renal cell carcinoma  . PACEMAKER INSERTION  08/21/2012   Medtronic Adapta L dual-chamber pacemaker  . PERCUTANEOUS CORONARY STENT INTERVENTION (PCI-S) N/A 04/09/2013   Procedure: PERCUTANEOUS CORONARY STENT INTERVENTION (PCI-S);  Surgeon: Burnell Blanks, MD;  [facility] LAB;  Service: Cardiovascular;  Laterality: N/A;  . PERMANENT PACEMAKER INSERTION N/A 08/21/2012   Procedure: PERMANENT PACEMAKER INSERTION;  Surgeon: Evans Lance,  MD;  Location: [facility] CATH LAB;  Service: Cardiovascular;  Laterality: N/A;  . RIGHT HEART CATH AND CORONARY/GRAFT ANGIOGRAPHY N/A 04/19/2017   Procedure: RIGHT HEART CATH AND CORONARY/GRAFT ANGIOGRAPHY;  Surgeon: Larey Dresser, MD;  Location: [place] CV LAB;  Service: Cardiovascular;  Laterality: N/A;  . TEE WITHOUT CARDIOVERSION N/A 12/07/2015   Procedure: TRANSESOPHAGEAL ECHOCARDIOGRAM (TEE);  Surgeon: Larey Dresser, MD;  Location: [place];  Service: Cardiovascular;  Laterality: N/A;  . TOTAL ABDOMINAL HYSTERECTOMY W/ BILATERAL SALPINGOOPHORECTOMY  2000  . TOTAL MASTECTOMY Left 05/17/2015   Procedure: LEFT TOTAL MASTECTOMY;  Surgeon: Rolm Bookbinder, MD;  Location: [place];  Service: General;  Laterality: Left;  . TUMOR EXCISION  1981   removal of Hodgkins lymphoma    Current Medications: Current Meds  Medication Sig  . Alirocumab (PRALUENT [place]) Inject 150 mLs into the skin every 14 (fourteen) days.  . ALPRAZolam (XANAX) 0.5 MG tablet Take 0.5 mg  by mouth at bedtime. May take additional dose daily for anxiety  . amLODipine (NORVASC) 5 MG tablet Take 0.5 tablets (2.5 mg total) by mouth daily.  . APIDRA 100 UNIT/ML injection Inject 46 Units into the skin as directed. INFUSE THROUGH INSULIN PUMP UTD::[place] 46.15 units daily, [place] depends on carb intake  . aspirin EC 81 MG tablet Take 81 mg by mouth at bedtime.  Marland Kitchen azithromycin (ZITHROMAX) 500 MG tablet 1 tablet by mouth one hour prior to dental appointment/procedure  . Calcium Carbonate-Vitamin D (RA CALCIUM PLUS VITAMIN D) 600-400 MG-UNIT per tablet Take 1 tablet by mouth daily.   . Cholecalciferol 2000 UNITS TABS Take 1 tablet by mouth every morning.   . clobetasol (OLUX) 0.05 % topical foam Apply 1 application topically daily.  . clobetasol (TEMOVATE) 0.05 % external solution Apply 1 application topically daily as needed (for hives). Reported on 12/05/2015  . Coenzyme Q10 (CO Q 10) 100 MG CAPS Take 1 capsule by mouth at bedtime.     . colchicine 0.6 MG tablet Take 0.6 mg by mouth at bedtime. And,Take 1 tablet at onset and repeate in 1 hour and then 1 daily x 2 (FOR FLARES)   . docusate sodium (COLACE) 100 MG capsule Take 100 mg by mouth at bedtime.  . Fe Fum-FA-B Cmp-C-Zn-Mg-Mn-Cu (HEMATINIC PLUS COMPLEX) 106-1 MG TABS Take 1 tablet by mouth every Monday, Wednesday, and Friday. Take 1 tablet oraly three times a week or as directed daily with with a Vitamin C tablet  . glucagon (GLUCAGON EMERGENCY) 1 MG injection Inject 1 mg into the vein once as needed (for severe reaction). Reported on 12/05/2015  . IRON PO Take 1 tablet by mouth 3 (three) times a week.  . isosorbide mononitrate (IMDUR) 120 MG 24 hr tablet Take 120 mg by mouth daily.  Marland Kitchen latanoprost (XALATAN) 0.005 % ophthalmic solution Place 1 drop into both eyes at bedtime.  . Magnesium Oxide 250 MG TABS Take 250 mg by mouth at bedtime.   . metolazone (ZAROXOLYN) 2.5 MG tablet Take 2.5 mg (1 tab) once as directed by CHF clinic. Call before taking [phone] (Patient taking differently: Take 2.5 mg by mouth See admin instructions. Take 2.5 mg (1 tab) once as directed by CHF clinic. Call before taking [phone])  . metoprolol tartrate (LOPRESSOR) 50 MG tablet TAKE 1 AND 1/2 TABLETS(75 MG) BY MOUTH TWICE DAILY  . Multiple Vitamins-Minerals (MULTIVITAMIN WITH MINERALS) tablet Take 1 tablet by mouth every morning.   . nitroGLYCERIN (NITROLINGUAL) 0.4 MG/SPRAY spray Place 1 spray under the tongue every 5 (five) minutes as needed for chest pain.  . pantoprazole (PROTONIX) 40 MG tablet Take 1 tablet (40 mg total) by mouth daily.  Vladimir Faster Glycol-Propyl Glycol (SYSTANE) 0.4-0.3 % SOLN Apply 1 drop to eye daily as needed (dry eyes).  . potassium chloride (MICRO-K) 10 MEQ CR capsule Take 4 capsules (40 mEq total) by mouth 2 (two) times daily.  Marland Kitchen PRALUENT 150 MG/ML SOPN Inject 100 mg as directed every 14 (fourteen) days.  . pravastatin (PRAVACHOL) 10 MG tablet TAKE 1 TABLET BY  MOUTH ONCE DAILY  . ticagrelor (BRILINTA) 60 MG TABS tablet TAKE 1 TABLET(60 MG) BY MOUTH TWICE DAILY  . torsemide (DEMADEX) 20 MG tablet Take 3 tablets (60 mg total) by mouth 2 (two) times daily.  Marland Kitchen triamcinolone cream (KENALOG) 0.5 % Apply 1 application topically daily as needed (inflamation).   Marland Kitchen ULORIC 40 MG tablet Take 40 mg by mouth at bedtime.   Marland Kitchen  vitamin C (ASCORBIC ACID) 500 MG tablet Take 500 mg by mouth 3 (three) times a week.     Allergies:   Avandia [rosiglitazone maleate]; Cephalexin; Clindamycin hcl; Lincomycin; Metformin; Novolog [insulin aspart]; Penicillins; Tizanidine; Adhesive [tape]; Atorvastatin; Cholestyramine; Crestor [rosuvastatin]; Gentamycin [gentamicin]; Humalog [insulin lispro]; Iodinated diagnostic agents; Limonene; Pravastatin; Prednisone; Simvastatin; Strawberry extract; Plavix [clopidogrel bisulfate]; Codeine; Erythromycin; Erythromycin base; Hydrocodone-acetaminophen; Latex; Neomycin; Nickel; Ranitidine; Tamiflu [oseltamivir]; and Tramadol   Social History   Social History  . Marital status: Married    Spouse name: N/A  . Number of children: 2  . Years of education: N/A   Occupational History  . retired     Equities trader principal   Social History Main Topics  . Smoking status: Never Smoker  . Smokeless tobacco: Never Used  . Alcohol use No  . Drug use: No  . Sexual activity: Yes   Other Topics Concern  . None   Social History Narrative   Retired Youth worker principal, married    Started disability September 2013     Family History: The patient's family history includes Breast cancer (age of onset: 22) in her sister; Cancer (age of onset: 6) in her sister; Cancer (age of onset: 65) in her maternal grandfather; Diabetes in her father; Glaucoma in her brother; Heart attack in her father and mother; Heart disease in her father and mother; Hypertension in her brother; Lung cancer in her sister. There is no history of Colon cancer or Stomach cancer.  ROS:    Please see the history of present illness.    ROS  All other systems reviewed and negative.   EKGs/Labs/Other Studies Reviewed:    The following studies were reviewed today: CPAP download  EKG:  EKG is not ordered today.   Recent Labs: 08/07/2016: Magnesium 2.5 09/18/2016: TSH 1.990 02/02/2017: ALT 44 03/05/2017: B Natriuretic Peptide 133.6 04/19/2017: BUN 36; Creatinine, Ser 1.44; Hemoglobin 13.5; Platelets 126; Potassium 3.6; Sodium 140   Recent Lipid Panel    Component Value Date/Time   CHOL 125 04/27/2016 0815   TRIG 199 (H) 04/27/2016 0815   HDL 43 (L) 04/27/2016 0815   CHOLHDL 2.9 04/27/2016 0815   VLDL 40 (H) 04/27/2016 0815   LDLCALC 42 04/27/2016 0815   LDLDIRECT 43.0 12/14/2014 0938    Physical Exam:    VS:  BP (!) 118/56   Pulse 75   Ht 5' 3"  (1.6 m)   Wt 116 lb 12.8 oz (53 kg)   SpO2 98%   BMI 20.69 kg/m     Wt Readings from Last 3 Encounters:  04/24/17 116 lb 12.8 oz (53 kg)  04/19/17 112 lb (50.8 kg)  04/16/17 118 lb (53.5 kg)     GEN:  Well nourished, well developed in no acute distress HEENT: Normal NECK: No JVD; right carotid bruit LYMPHATICS: No lymphadenopathy CARDIAC: RRR, no murmurs, rubs, gallops RESPIRATORY:  Clear to auscultation without rales, wheezing or rhonchi  ABDOMEN: Soft, non-tender, non-distended MUSCULOSKELETAL:  No edema; No deformity  SKIN: Warm and dry NEUROLOGIC:  Alert and oriented x 3 PSYCHIATRIC:  Normal affect   ASSESSMENT:    1. Chronic diastolic CHF (congestive heart failure) (HCC)    PLAN:    In order of problems listed above:  1.  OSA - the patient is tolerating PAP therapy well without any problems. The PAP download was reviewed today and showed an AHI of 1.7/hr on Auto PAP with 93% compliance in using more than 4 hours nightly.  The patient has  been using and benefiting from CPAP use and will continue to benefit from therapy. She is having problems with the higher pressure setting on auto and her mask  starts to leak around 17cm H2o.  Since her AHI is good I am going to drop her pressure to 14cm H2O to see if she sill tolerate it better.  She also would like to try a nasal pillow mask so I will order that for her and get a download in 4 weeks.   2.  HTN - BP well controlled on exam today.  She will continue on amlodipine 2.69m daily and Lopressor 731mBID.       Medication Adjustments/Labs and Tests Ordered: Current medicines are reviewed at length with the patient today.  Concerns regarding medicines are outlined above.  No orders of the defined types were placed in this encounter.  No orders of the defined types were placed in this encounter.   Signed, TrFransico HimMD  04/25/2017 9:11 AM    [place]

## 2017-04-24 NOTE — Telephone Encounter (Signed)
Per Dr Radford Pax.Marland KitchenMarland KitchenDecrease CPAP to 14 cm H20 and repeat D/L in 4 weeks Resmed Airfit P20 mask with chin strap  Order sent to [facility] [facility] [facility] at fax # [phone].

## 2017-04-25 ENCOUNTER — Ambulatory Visit (HOSPITAL_COMMUNITY): Payer: Medicare Other | Attending: Cardiology

## 2017-04-25 ENCOUNTER — Other Ambulatory Visit (HOSPITAL_COMMUNITY): Payer: Self-pay

## 2017-04-25 ENCOUNTER — Other Ambulatory Visit: Payer: Self-pay

## 2017-04-25 DIAGNOSIS — I4891 Unspecified atrial fibrillation: Secondary | ICD-10-CM | POA: Insufficient documentation

## 2017-04-25 DIAGNOSIS — I130 Hypertensive heart and chronic kidney disease with heart failure and stage 1 through stage 4 chronic kidney disease, or unspecified chronic kidney disease: Secondary | ICD-10-CM | POA: Diagnosis not present

## 2017-04-25 DIAGNOSIS — Z953 Presence of xenogenic heart valve: Secondary | ICD-10-CM | POA: Insufficient documentation

## 2017-04-25 DIAGNOSIS — I495 Sick sinus syndrome: Secondary | ICD-10-CM | POA: Diagnosis not present

## 2017-04-25 DIAGNOSIS — E785 Hyperlipidemia, unspecified: Secondary | ICD-10-CM | POA: Insufficient documentation

## 2017-04-25 DIAGNOSIS — I4892 Unspecified atrial flutter: Secondary | ICD-10-CM | POA: Diagnosis not present

## 2017-04-25 DIAGNOSIS — G4733 Obstructive sleep apnea (adult) (pediatric): Secondary | ICD-10-CM | POA: Diagnosis not present

## 2017-04-25 DIAGNOSIS — I509 Heart failure, unspecified: Secondary | ICD-10-CM | POA: Diagnosis not present

## 2017-04-25 DIAGNOSIS — Z853 Personal history of malignant neoplasm of breast: Secondary | ICD-10-CM | POA: Insufficient documentation

## 2017-04-25 DIAGNOSIS — D649 Anemia, unspecified: Secondary | ICD-10-CM | POA: Insufficient documentation

## 2017-04-25 DIAGNOSIS — N183 Chronic kidney disease, stage 3 (moderate): Secondary | ICD-10-CM | POA: Insufficient documentation

## 2017-04-25 DIAGNOSIS — Z8572 Personal history of non-Hodgkin lymphomas: Secondary | ICD-10-CM | POA: Diagnosis not present

## 2017-04-25 DIAGNOSIS — I340 Nonrheumatic mitral (valve) insufficiency: Secondary | ICD-10-CM

## 2017-04-25 DIAGNOSIS — I081 Rheumatic disorders of both mitral and tricuspid valves: Secondary | ICD-10-CM | POA: Diagnosis not present

## 2017-04-25 DIAGNOSIS — Z9889 Other specified postprocedural states: Secondary | ICD-10-CM | POA: Insufficient documentation

## 2017-04-25 DIAGNOSIS — E1122 Type 2 diabetes mellitus with diabetic chronic kidney disease: Secondary | ICD-10-CM | POA: Insufficient documentation

## 2017-04-25 DIAGNOSIS — I2510 Atherosclerotic heart disease of native coronary artery without angina pectoris: Secondary | ICD-10-CM | POA: Insufficient documentation

## 2017-04-25 DIAGNOSIS — I371 Nonrheumatic pulmonary valve insufficiency: Secondary | ICD-10-CM | POA: Diagnosis not present

## 2017-04-25 NOTE — Telephone Encounter (Signed)
Pt called and left message, but came to the office as a walk-in due to having an rash that developed on right side extremities. [place] MD defined this as an reaction to dye and to take over the counter Benadryl. Pt was verbally instructed and agreeable.

## 2017-04-26 ENCOUNTER — Other Ambulatory Visit: Payer: Self-pay | Admitting: Internal Medicine

## 2017-04-29 ENCOUNTER — Other Ambulatory Visit (HOSPITAL_COMMUNITY): Payer: Self-pay | Admitting: *Deleted

## 2017-04-29 DIAGNOSIS — I6523 Occlusion and stenosis of bilateral carotid arteries: Secondary | ICD-10-CM

## 2017-04-29 MED ORDER — TICAGRELOR 60 MG PO TABS: ORAL_TABLET | ORAL | 2 refills | Status: DC

## 2017-05-06 ENCOUNTER — Encounter: Payer: Self-pay | Admitting: Cardiology

## 2017-05-13 ENCOUNTER — Telehealth (HOSPITAL_COMMUNITY): Payer: Self-pay

## 2017-05-13 NOTE — Telephone Encounter (Signed)
With her coronaries, she will likely have intermittent angina.  Make sure she takes Imdur 120 mg every morning.

## 2017-05-13 NOTE — Telephone Encounter (Signed)
Advanced Heart Failure Triage Encounter  Patient Name: [name] [name]  Date of Call: 05/13/17  Problem:  Pt called b/c she had chest pain on Saturday 05/10/17 before she could find a safe place to pull over to take nitro it had completely resolved. She then began to to have right arm pain that is intermittent. I instructed her to take nitro and go to ED if it occurs again. Will send to MD for further evaluation. Pt has appt on Wednesday 10/24.     Pt seen on 05/15/17   Shirley Muscat, RN

## 2017-05-15 ENCOUNTER — Ambulatory Visit (HOSPITAL_COMMUNITY)
Admission: RE | Admit: 2017-05-15 | Discharge: 2017-05-15 | Disposition: A | Discharge status: Home / Self Care | Payer: Medicare Other | Source: Ambulatory Visit | Attending: Cardiology | Admitting: Cardiology

## 2017-05-15 ENCOUNTER — Encounter (HOSPITAL_COMMUNITY): Payer: Self-pay | Admitting: Cardiology

## 2017-05-15 VITALS — BP 118/68 | HR 99 | Wt 117.8 lb

## 2017-05-15 DIAGNOSIS — I2510 Atherosclerotic heart disease of native coronary artery without angina pectoris: Secondary | ICD-10-CM | POA: Diagnosis not present

## 2017-05-15 DIAGNOSIS — Z7902 Long term (current) use of antithrombotics/antiplatelets: Secondary | ICD-10-CM | POA: Diagnosis not present

## 2017-05-15 DIAGNOSIS — I480 Paroxysmal atrial fibrillation: Secondary | ICD-10-CM

## 2017-05-15 DIAGNOSIS — I5032 Chronic diastolic (congestive) heart failure: Secondary | ICD-10-CM | POA: Diagnosis not present

## 2017-05-15 DIAGNOSIS — Z9013 Acquired absence of bilateral breasts and nipples: Secondary | ICD-10-CM | POA: Diagnosis not present

## 2017-05-15 DIAGNOSIS — N183 Chronic kidney disease, stage 3 unspecified: Secondary | ICD-10-CM

## 2017-05-15 DIAGNOSIS — D649 Anemia, unspecified: Secondary | ICD-10-CM | POA: Insufficient documentation

## 2017-05-15 DIAGNOSIS — Z7982 Long term (current) use of aspirin: Secondary | ICD-10-CM | POA: Diagnosis not present

## 2017-05-15 DIAGNOSIS — Z79899 Other long term (current) drug therapy: Secondary | ICD-10-CM | POA: Insufficient documentation

## 2017-05-15 DIAGNOSIS — D696 Thrombocytopenia, unspecified: Secondary | ICD-10-CM | POA: Insufficient documentation

## 2017-05-15 DIAGNOSIS — J9811 Atelectasis: Secondary | ICD-10-CM | POA: Insufficient documentation

## 2017-05-15 DIAGNOSIS — I25110 Atherosclerotic heart disease of native coronary artery with unstable angina pectoris: Secondary | ICD-10-CM | POA: Insufficient documentation

## 2017-05-15 DIAGNOSIS — G4733 Obstructive sleep apnea (adult) (pediatric): Secondary | ICD-10-CM | POA: Insufficient documentation

## 2017-05-15 DIAGNOSIS — Z950 Presence of cardiac pacemaker: Secondary | ICD-10-CM | POA: Diagnosis not present

## 2017-05-15 DIAGNOSIS — Z794 Long term (current) use of insulin: Secondary | ICD-10-CM | POA: Diagnosis not present

## 2017-05-15 DIAGNOSIS — R6889 Other general symptoms and signs: Secondary | ICD-10-CM | POA: Diagnosis not present

## 2017-05-15 DIAGNOSIS — Z905 Acquired absence of kidney: Secondary | ICD-10-CM | POA: Diagnosis not present

## 2017-05-15 DIAGNOSIS — I130 Hypertensive heart and chronic kidney disease with heart failure and stage 1 through stage 4 chronic kidney disease, or unspecified chronic kidney disease: Secondary | ICD-10-CM | POA: Diagnosis not present

## 2017-05-15 DIAGNOSIS — E785 Hyperlipidemia, unspecified: Secondary | ICD-10-CM | POA: Insufficient documentation

## 2017-05-15 DIAGNOSIS — J90 Pleural effusion, not elsewhere classified: Secondary | ICD-10-CM | POA: Insufficient documentation

## 2017-05-15 DIAGNOSIS — I495 Sick sinus syndrome: Secondary | ICD-10-CM | POA: Insufficient documentation

## 2017-05-15 DIAGNOSIS — E1122 Type 2 diabetes mellitus with diabetic chronic kidney disease: Secondary | ICD-10-CM | POA: Insufficient documentation

## 2017-05-15 DIAGNOSIS — Z951 Presence of aortocoronary bypass graft: Secondary | ICD-10-CM | POA: Diagnosis not present

## 2017-05-15 DIAGNOSIS — Z8673 Personal history of transient ischemic attack (TIA), and cerebral infarction without residual deficits: Secondary | ICD-10-CM | POA: Insufficient documentation

## 2017-05-15 DIAGNOSIS — Z853 Personal history of malignant neoplasm of breast: Secondary | ICD-10-CM | POA: Insufficient documentation

## 2017-05-15 LAB — LIPID PANEL
CHOLESTEROL: 80 mg/dL (ref 0–200)
HDL: 40 mg/dL — ABNORMAL LOW (ref >40)
LDL Cholesterol: 16 mg/dL (ref 0–99)
TRIGLYCERIDES: 119 mg/dL (ref <150)
Total CHOL/HDL Ratio: 2 RATIO
VLDL: 24 mg/dL (ref 0–40)

## 2017-05-15 LAB — BASIC METABOLIC PANEL
Anion gap: 10 (ref 5–15)
BUN: 32 mg/dL — AB (ref 6–20)
CALCIUM: 9.5 mg/dL (ref 8.9–10.3)
CHLORIDE: 102 mmol/L (ref 101–111)
CO2: 27 mmol/L (ref 22–32)
CREATININE: 1.21 mg/dL — AB (ref 0.44–1.00)
GFR calc non Af Amer: 46 mL/min — ABNORMAL LOW (ref >60)
GFR, EST AFRICAN AMERICAN: 54 mL/min — AB (ref >60)
Glucose, Bld: 160 mg/dL — ABNORMAL HIGH (ref 65–99)
Potassium: 3.5 mmol/L (ref 3.5–5.1)
SODIUM: 139 mmol/L (ref 135–145)

## 2017-05-15 LAB — URINALYSIS, ROUTINE W REFLEX MICROSCOPIC
Bilirubin Urine: NEGATIVE
GLUCOSE, UA: NEGATIVE mg/dL
Hgb urine dipstick: NEGATIVE
KETONES UR: NEGATIVE mg/dL
LEUKOCYTES UA: NEGATIVE
NITRITE: NEGATIVE
PROTEIN: NEGATIVE mg/dL
Specific Gravity, Urine: 1.011 (ref 1.005–1.030)
pH: 5 (ref 5.0–8.0)

## 2017-05-15 LAB — CBC
HCT: 36.7 % (ref 36.0–46.0)
Hemoglobin: 11.9 g/dL — ABNORMAL LOW (ref 12.0–15.0)
MCH: 29.3 pg (ref 26.0–34.0)
MCHC: 32.4 g/dL (ref 30.0–36.0)
MCV: 90.4 fL (ref 78.0–100.0)
PLATELETS: DECREASED 10*3/uL (ref 150–400)
RBC: 4.06 MIL/uL (ref 3.87–5.11)
RDW: 16.2 % — AB (ref 11.5–15.5)
WBC: 13.9 10*3/uL — ABNORMAL HIGH (ref 4.0–10.5)

## 2017-05-15 MED ORDER — TORSEMIDE 20 MG PO TABS: ORAL_TABLET | ORAL | 6 refills | Status: DC

## 2017-05-15 MED ORDER — SPIRONOLACTONE 25 MG PO TABS: 12.5000 mg | ORAL_TABLET | Freq: Every day | ORAL | 3 refills | Status: DC

## 2017-05-15 NOTE — Progress Notes (Signed)
Advanced Heart Failure Medication Review by a Pharmacist  Does the patient  feel that his/her medications are working for him/her?  yes  Has the patient been experiencing any side effects to the medications prescribed?  no  Does the patient measure his/her own blood pressure or blood glucose at home?  yes   Does the patient have any problems obtaining medications due to transportation or finances?   no  Understanding of regimen: good Understanding of indications: good Potential of compliance: good Patient understands to avoid NSAIDs. Patient understands to avoid decongestants.  Issues to address at subsequent visits: None    Pharmacist comments: [name] [name] is a pleasant 66 yo F presenting with her husband and without a medication list but with good recall of her regimen. She reports good compliance with her regimen. She has had some nausea/vomiting over night so was not able to take her morning doses of medications today. No other medication-related questions or concerns for me at this time.   Ruta Hinds. Velva Harman, PharmD, BCPS, CPP Clinical Pharmacist Pager: [phone] Phone: [phone] 05/15/2017 9:47 AM      Time with patient: 10 minutes Preparation and documentation time: 2 minutes Total time: 12 minutes

## 2017-05-15 NOTE — Patient Instructions (Signed)
Start Spironolactone 12.5 mg (1/2 tab) daily  Increase Torsemide 80 mg (4 tabs) , twice a day for 2 days          -Take 80 mg (4 tabs) in AM, then 60 mg (3 tabs) in PM   .Labs drawn today (if we do not call you, then your lab work was stable)   You have been referred to Cardiac Rehab (They will call you)  A chest x-ray takes a picture of the organs and structures inside the chest, including the heart, lungs, and blood vessels. This test can show several things, including, whether the heart is enlarges; whether fluid is building up in the lungs; and whether pacemaker / defibrillator leads are still in place.  Your physician recommends that you return for lab work in: 10 days  Your physician recommends that you schedule a follow-up appointment in: 2 weeks

## 2017-05-15 NOTE — Progress Notes (Signed)
Patient ID: [name] [name], female   DOB: 1950-10-23, 66 y.o.   MRN: [id number] PCP: Dr. Hilma Favors Cardiology: Dr. Aundra Dubin  66 yo with history of CAD s/p CABG in 12/05 and redo in 1/11, bioprosthetic AVR, diastolic CHF, and sick sinus syndrome s/p PCM presents for cardiology followup.  She had initial CABG with SVG-RCA in 12/05 followed by redo SVG-RCA and bioprosthetic AVR in 1/11.  She has had diastolic CHF and is on Lasix.  In 9/14, she had a CPX test.  This showed severe functional limitation with ischemic ECG changes (inferolateral ST depression and ST elevation in V1 and V2). She had chest pain.  She had R/LHC in 9/14.  This showed elevated left and right heart filling pressures, patent SVG-PDA, and 80% distal LM/80% ostial LAD stenosis.  She was not a candidate for redo CABG (had 2 prior sternotomies as well as chest radiation for Hodgkins lymphoma).  She had DES from left main into proximal LAD and will need long-term DAPT => Brilinta was used as she has been a poor responder to Plavix in the past.  She re-developed angina in 11/15 and was admitted with unstable angina.  LHC showed 80% ostial LM and patent SVG-RCA. She had DES to ostial LM. Echo in 1/18 showed EF 60-65%, normal bioprosthetic aortic valve, mild-moderate MR, PASP 35 mmHg.   She has chronic iron deficiency anemia thought to be due to a slow GI bleed.  Last EGD was unremarkable and a capsule endoscopy was also unremarkable.  She has had periodic transfusions; However, recently hemoglobin has been stable.    She had bilateral mastectomy for DCIS in 29/93 without complication.  She has an insulin pump followed by an endocrinologist at [facility].   Given myalgias with statins, she was started on Praluent.  LDL is now excellent.    In the past she has had short runs of atrial fibrillation.  She is not anticoagulated.  Planned for Watchman placement, but given her nickel allergy, we were unable to place a Watchman.    Cardiolite in 2/18  showed no ischemia or infarction.     Diagnosed with OSA, now on CPAP.   Given ongoing problems with exertional dyspnea and occasional chest pain, Cardiolite was repeated in 9/18 and showed inferior changes.  She underwent left/right heart cath in 9/18.  This showed elevated filling pressures, R>L.  There was 75+% ostial LCx stenosis.  However, there were 2 layers of stent from the mid left main into the ostial LAD, and LCx intervention was thought to be very risky.  It was decided to manage her medically.  Echo was repeated in 10/18 showing EF 55-60%, moderate to severe TR.    She returns for followup of CHF and CAD today.  She has only had 1 episode of chest pain since her cath, this occurred last Saturday while driving in the car.  It lasted for about a minute and resolved without intervention.  She still gets short of breath with moderate exertion like fast walking.  No orthopnea/PND.  She is taking all her meds.  No lightheadedness, palpitations, or syncope.  Last night, she had an episode of severe shaking chills.  She was nauseated and vomited.  No diarrhea.  No cough/sputum.  No dysuria/urgency.  No fever (temp was 97.6 last night with symptoms).  Feeling better today, no rigors or nausea.  She has had her flu shot.    REDS vest attempt was unsuccessful due to body habitus.   ECG (  personally reviewed): NSR, LVH, QTc 498 msec  Labs (7/14): hemoglobin 11 Labs (9/14): K 3.9, creatinine 1.48, hemoglobin 7.3 Labs (10/14): hemoglobin 7.7 Labs (11/14): K 3.8, creatinine 1.5 Labs (12/14): HCT 37.7 Labs (1/15): LDL 106, HDL 41, K 3.9, creatinine 1.6, LFTs normal Labs (2/15): K 3.7, creatinine 1.1, BNP 71 Labs (3/15): HCT 36.7 Labs (4/15): K 3.7, creatinine 1.1, HCT 35.5 Labs (7/15): HCT 36.4 Labs (11/15): HCT 32.1, K 4.2, creatinine 1.06 Labs (12/15): hgb 12 Labs (1/16): HCT 37.1 Labs (3/16): LDL 25, HDL 42, elevated transaminases Labs (6/16): HCT 39.6, plts 146 Labs (7/16): LDL 59, HDL 41,  AST 34, ALT 49, AP 125 Labs (11/16): HCT 38.8, alkaline phosphatase 156, AST/ALT normal, K 4, creatinine 1.0 Labs (3/17): K 4.4, creatinine 1.4, LFTs normal, hgb 12.5 Labs (5/17): HCT 41.8, K 4, creatinine 1.3 Labs (9/17): K 4, creatinine 0.43, hgb 13.6 Labs (10/17): LDL 42, HDL 43 Labs (1/18): K 4.3, creatinine 1.1 => 1.19, AST 66, ALT 67, hgb 14.2, BNP 72 Labs (2/18): K 3.9, creatinine 1.2, BNP 49.6, HCT 42.1 Labs (4/18): hgb 13.8 Labs (5/18): HCT 41.7, plts 98, K 4.3, creatinine 1.11, BNP 180 Labs (7/18): hgb 12, plts 110, K 4, creatinine 1.23, AST 38, ALT 44 Labs (8/18): K 5, creatinine 1.17 => 2.1, BUN 33 => 92, BNP 134 Labs (9/18): K 3.6, creatinine 1.42 => 1.44. Hgb 14 => 13.5  PMH: 1. Sick sinus syndrome s/p [employer] PCM.  2. HTN 3. H/o SBO 4. Renal cell carcinoma s/p right nephrectomy in 2008.  5. 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  Fibroscan in 11/14 showed advanced fibrosis concerning for cirrhosis. EGD in 11/14 showed no varices.  6. Diastolic CHF: Echo (4/00) with EF 60-65%, bioprosthetic aortic valve with mean gradient 15 mmHg, mild MR.  CPX (9/14): peak VO2 10.4, VE/VCO2 43.8, inferolateral ST depression and ST elevation in V1/V2 with severe dyspnea and chest pain, severe functional limitation with ischemic changes.  Echo (9/14) with EF 60-65%, normal RV size and systolic function, mild-moderate MR, bioprosthetic aortic valve normal, PA systolic pressure 51 mmHg, no evidence for pericardial constriction though exam incomplete for constriction.  Echo (11/15) with EF 55-60%, bioprosthetic aortic valve, mild-moderate MR, moderate TR, PA systolic pressure 42 mmHg.  - TEE (5/17) with EF 60-65%, normal bioprosthetic aortic valve with mean gradient 13 mmHg, normal RV size and systolic function. - Echo (1/18) with EF 60-65%, normal bioprosthetic aortic valve, mild-moderate MR, PASP 35 mmHg.  - Echo (10/18) with EF 55-60%, bioprosthetic aortic valve mean gradient 10 mmHg, MAC with  mild mitral stenosis (mean gradient 7) and mild mitral regurgitation, moderate to severe TR, PASP 43 mmHg.  - RHC (9/18): mean RA 13, PA 44/18 mean 33, mean PCWP 19, CI 3.45, PVR 2.7 WU.  7. TAH-BSO 8. Type I diabetes: Has insulin pump.  9. H/o ischemic colitis. 10. H/o PUD. 11. Diabetic gastroparesis. 12. Hyperlipidemia: Myalgias with Crestor and atorvastatin, elevated LFTs with simvastatin.  Myalgias with > 10 mg pravastatin. Now on Praluent.  13. CKD 14. Bicuspid aortic valve s/p 19 mm Edwards pericardial valve in 1/11 (had post-operative Dresslers syndrome).   15. CAD: CABG with SVG-RCA in 12/05.  Redo SVG-RCA in 1/11 with AVR.  LHC/RHC (9/14) with mean RA 18, PA 62/25 mean 43, mean PCWP 26, CI 2.8, 70-80% distal LM stenosis, 80% ostial LAD stenosis, total occlusion RCA, SVG-PDA patent.  Patient had DES LM into LAD.  Plan to continue long-term ASA/Brilinta (poor responder to Plavix).  ETT-cardiolite (4/15) with 6' exercise, EF 60%, ST depression in recovery and mild chest pain, no ischemia or infarction by perfusion images.  Unstable angina 11/15 with 80% LM, patent SVG-RCA => DES to ostial LM.   - Lexiscan Cardiolite (2/18): EF 62%, no ischemia or infarction (normal).  She had a significant reaction to [place] and probably should not get again.  - Cardiolite (9/18): EF 72%, prior inferior infarct with mild peri-infarct ischemia.  - LHC (9/18): 2 layers stent left main - ostial LAD with 50% dLM in-stent restenosis, 75+% ostial LCx, totally occluded RCA, SVG-RCA patent.  Medical management planned.  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  17. Anemia: Iron-deficiency.  Suspect chronic GI blood loss.  EGD in 9/14 was unremarkable.  Capsule endoscopy 10/14 was unremarkable.  Has required periodic blood transfusions.  18. Atrial fibrillation: Paroxysmal, noted by [facility] interrogation.  19. Carotid stenosis: Carotid dopplers (2/15) with 40-59% bilateral  stenosis. Carotid dopplers (3/16) with 40-59% bilateral ICA stenosis. Carotid dopplers (3/17) with 40-59% bilateral ICA stenosis.  - Carotid dopplers (3/18) with mild BICA stenosis.  20. 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  22. C difficile colitis 2017 23. Gout 24. ?TIA: head CT negative.  25. OSA: Moderate, uses CPAP.  26. Chronic thrombocytopenia  SH: Married, lives in [place], nonsmoker.   FH: CAD  ROS: All systems reviewed and negative except as per HPI.   Current Outpatient Prescriptions  Medication Sig Dispense Refill  . Alirocumab (PRALUENT [place]) Inject 150 mLs into the skin every 14 (fourteen) days.    . ALPRAZolam (XANAX) 0.5 MG tablet Take 0.5 mg by mouth at bedtime. May take additional dose daily for anxiety    . amLODipine (NORVASC) 5 MG tablet Take 0.5 tablets (2.5 mg total) by mouth daily. 45 tablet 3  . APIDRA 100 UNIT/ML injection Inject 46 Units into the skin as directed. INFUSE THROUGH INSULIN PUMP UTD::[place] 46.15 units daily, Bolus depends on carb intake  2  . aspirin EC 81 MG tablet Take 81 mg by mouth at bedtime.    . Calcium Carbonate-Vitamin D (RA CALCIUM PLUS VITAMIN D) 600-400 MG-UNIT per tablet Take 1 tablet by mouth daily.     . Cholecalciferol 2000 UNITS TABS Take 1 tablet by mouth every morning.     . clobetasol (OLUX) 0.05 % topical foam Apply 1 application topically daily.  0  . Coenzyme Q10 (CO Q 10) 100 MG CAPS Take 1 capsule by mouth at bedtime.     . colchicine 0.6 MG tablet Take 0.6 mg by mouth at bedtime. And,Take 1 tablet at onset and repeate in 1 hour and then 1 daily x 2 (FOR FLARES)     . docusate sodium (COLACE) 100 MG capsule Take 100 mg by mouth at bedtime.    . Fe Fum-FA-B Cmp-C-Zn-Mg-Mn-Cu (HEMATINIC PLUS COMPLEX) 106-1 MG TABS Take 1 tablet by mouth every Monday, Wednesday, and Friday. Take 1 tablet oraly three  times a week or as directed daily with with a Vitamin C tablet 90 each 0  . isosorbide mononitrate (IMDUR) 120 MG 24 hr tablet Take 120 mg by mouth daily.    Marland Kitchen latanoprost (XALATAN) 0.005 % ophthalmic solution Place 1 drop into both eyes at bedtime.    . Magnesium Oxide 250 MG TABS Take 250 mg by mouth at bedtime.     . metoprolol tartrate (  LOPRESSOR) 50 MG tablet TAKE 1 AND 1/2 TABLETS(75 MG) BY MOUTH TWICE DAILY 270 tablet 0  . Multiple Vitamins-Minerals (MULTIVITAMIN WITH MINERALS) tablet Take 1 tablet by mouth every morning.     . pantoprazole (PROTONIX) 40 MG tablet Take 1 tablet (40 mg total) by mouth daily. 30 tablet 11  . Polyethyl Glycol-Propyl Glycol (SYSTANE) 0.4-0.3 % SOLN Apply 1 drop to eye daily as needed (dry eyes).    . potassium chloride (MICRO-K) 10 MEQ CR capsule Take 4 capsules (40 mEq total) by mouth 2 (two) times daily. 240 capsule 3  . pravastatin (PRAVACHOL) 10 MG tablet TAKE 1 TABLET BY MOUTH ONCE DAILY 90 tablet 3  . ticagrelor (BRILINTA) 60 MG TABS tablet TAKE 1 TABLET(60 MG) BY MOUTH TWICE DAILY 180 tablet 2  . torsemide (DEMADEX) 20 MG tablet Take 80 mg (4 tabs) in AM, then 60 mg (3 tabs) in PM 210 tablet 6  . triamcinolone cream (KENALOG) 0.5 % Apply 1 application topically daily as needed (inflamation).     Marland Kitchen ULORIC 40 MG tablet Take 40 mg by mouth at bedtime.   11  . vitamin C (ASCORBIC ACID) 500 MG tablet Take 500 mg by mouth 3 (three) times a week.    Marland Kitchen azithromycin (ZITHROMAX) 500 MG tablet 1 tablet by mouth one hour prior to dental appointment/procedure (Patient not taking: Reported on 05/15/2017) 2 tablet 0  . clobetasol (TEMOVATE) 0.05 % external solution Apply 1 application topically daily as needed (for hives). Reported on 12/05/2015    . glucagon (GLUCAGON EMERGENCY) 1 MG injection Inject 1 mg into the vein once as needed (for severe reaction). Reported on 12/05/2015    . metolazone (ZAROXOLYN) 2.5 MG tablet Take 2.5 mg (1 tab) once as directed by CHF clinic.  Call before taking [phone] (Patient not taking: Reported on 05/15/2017) 3 tablet 0  . nitroGLYCERIN (NITROLINGUAL) 0.4 MG/SPRAY spray Place 1 spray under the tongue every 5 (five) minutes as needed for chest pain. (Patient not taking: Reported on 05/15/2017) 12 g 1  . spironolactone (ALDACTONE) 25 MG tablet Take 0.5 tablets (12.5 mg total) by mouth daily. 15 tablet 3   No current facility-administered medications for this encounter.     BP 118/68   Pulse 99   Wt 117 lb 12.8 oz (53.4 kg)   SpO2 95%   BMI 20.87 kg/m  General: NAD Neck: JVP 8-9 cm with HJR, no thyromegaly or thyroid nodule.  Lungs: Decreased breath sounds right base.  CV: Nondisplaced PMI.  Heart regular S1/S2, no S3/S4, 2/6 SEM RUSB.  No peripheral edema.  No carotid bruit.  Normal pedal pulses.  Abdomen: Soft, nontender, no hepatosplenomegaly, no distention.  Skin: Intact without lesions or rashes.  Neurologic: Alert and oriented x 3.  Psych: Normal affect. Extremities: No clubbing or cyanosis.  HEENT: Normal.   Assessment/Plan:  1. CAD: s/p SVG-RCA in 12/05, then redo SVG-RCA in 1/11--> distal left main/proximal LAD severe stenosis in 9/14.  PCI with DES to distal LM/proximal LAD was done rather than bypass as she would have been a poor candidate for redo sternotomy (2 prior sternotomies and history of chest wall radiation for Hodgkins disease).  Recurrent unstable angina with 80% ostial LM treated with repeat DES in 11/15.  Lexiscan Cardiolite in 9/18 inferior changes.  Repeat LHC (9/18) showed 50% distal left main in-stent restenosis and 75+% ostial LCx stenosis. I reviewed the films with interventional cardiology => would be very difficult to intervene on the ostial LCx  through 2 overlapping stents from left main into the LAD.  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  Given no ACS, we planned medical management.  I increased her Imdur.  She has had minimal chest pain.  - Continue  Brilinta 60 mg bid (poor responder to plavix) long-term given recurrent left main disease.  This has been complicated in the past by suspected slow GI bleed. Most recent hemoglobin was normal. - Continue ASA 81, statin, metoprolol, and increased Imdur (120 mg daily).    - Refer for cardiac rehab to [facility] (angina).  2. Bioprosthetic aortic valve replacement: In setting of bicuspid aortic valve, thoracic aorta not dilated on 2017 TEE.  Stable on 10/18 echo.   3. Chronic diastolic CHF: Most recent echo in 10/18 showed normal LV systolic function but also showed moderate to severe TR.  RHC was concerning for right > left heart failure.  I am concerned that her volume overload and dyspnea are related to primarily RV failure at this point. NYHA class III symptoms.  She remains volume overloaded on exam.    - Increase torsemide to 80 mg bid x 3 days, then 80 qam/60 qpm long-term.  BMET today and again in 10 days.   - Add back spironolactone 12.5 mg daily with increased torsemide.  K is now on the lower side and some evidence for benefit in diastolic CHF patients.  4. CKD:  Patient has a single kidney s/p right nephrectomy.  Creatinine around 1.4 recently, CKD stage 3.  Will follow carefully with diuresis.  5. Hyperlipidemia: She has not been able to tolerate any higher potency statin than pravastatin 10 mg daily.   - Continue Praluent, check lipids today.        6. Anemia: Suspected slow GI bleed.  Unremarkable EGD and capsule endoscopy in fall 2014.  Unfortunately, needs to stay on Brilinta long-term (Plavix non-responder).  Will need to follow closely (sees hematology). Recently, hemoglobin has been more stable.   7. Atrial fibrillation: Paroxysmal, brief episodes noted on PCM interrogation.  Given GI bleeding and need to be on Brilinta, risks have been thought to outweigh benefits of anticoagulation.  She is in NSR today.  Less palpitations now that she is using CPAP.  She is not a good candidate for  anticoagulants due to to GI bleed.  She is not a good Multaq candidate with CHF.  If she needs an antiarrhythmic, consider Tikosyn or Sotalol.  - We were unable to place Watchman device due to nickel allergy.   8. Carotid bruit: Only mild stenosis on last dopplers in 3/18. 9. SSS: Pacemaker in place.  10. HTN: controlled. Continue current regimen.   11. OSA: Moderate, using CPAP.  12. ?TIA: Transient dysarthria in 3/18, could have been afib-related TIA.  No recurrent symptoms.  13. Thrombocytopenia: Chronic.  Needs to continue to follow with heme/onc.   14. Rigors, nausea/vomiting: Occurred last night.  Worrisome for infection but no fever when she took temperature at home and no other localizing symptoms.  Decreased breath sounds right base on exam with history of moderate right pleural effusion on prior imaging.  - Check CBC for WBCs.   - CXR PA/lateral - UA and culture.  - Close followup with PCP for further workup.

## 2017-05-16 ENCOUNTER — Other Ambulatory Visit: Payer: Self-pay | Admitting: Cardiology

## 2017-05-16 LAB — URINE CULTURE: CULTURE: NO GROWTH

## 2017-05-20 ENCOUNTER — Ambulatory Visit (INDEPENDENT_AMBULATORY_CARE_PROVIDER_SITE_OTHER): Payer: Medicare Other | Admitting: *Deleted

## 2017-05-20 ENCOUNTER — Telehealth: Payer: Self-pay | Admitting: Cardiology

## 2017-05-20 DIAGNOSIS — I495 Sick sinus syndrome: Secondary | ICD-10-CM

## 2017-05-20 NOTE — Telephone Encounter (Signed)
Spoke with pt and reminded pt of remote transmission that is due today. Pt verbalized understanding.   

## 2017-05-21 NOTE — Progress Notes (Signed)
Remote pacemaker transmission.   

## 2017-05-22 LAB — CUP PACEART REMOTE DEVICE CHECK
Battery Remaining Longevity: 142 mo
Battery Voltage: 2.81 V
Brady Statistic AP VP Percent: 0 %
Brady Statistic AP VS Percent: 0 %
Brady Statistic AS VP Percent: 0 %
Date Time Interrogation Session: 20181029225406
Implantable Lead Implant Date: 20140130
Implantable Lead Location: 753860
Implantable Lead Model: 5076
Implantable Pulse Generator Implant Date: 20140130
Lead Channel Impedance Value: 655 Ohm
Lead Channel Pacing Threshold Amplitude: 0.5 V
Lead Channel Pacing Threshold Amplitude: 0.75 V
Lead Channel Pacing Threshold Pulse Width: 0.4 ms
Lead Channel Setting Pacing Amplitude: 2.5 V
Lead Channel Setting Sensing Sensitivity: 5.6 mV
MDC IDC LEAD IMPLANT DT: 20140130
MDC IDC LEAD LOCATION: 753859
MDC IDC MSMT BATTERY IMPEDANCE: 234 Ohm
MDC IDC MSMT LEADCHNL RA IMPEDANCE VALUE: 494 Ohm
MDC IDC MSMT LEADCHNL RA SENSING INTR AMPL: 1.4 mV
MDC IDC MSMT LEADCHNL RV PACING THRESHOLD PULSEWIDTH: 0.4 ms
MDC IDC MSMT LEADCHNL RV SENSING INTR AMPL: 16 mV
MDC IDC SET LEADCHNL RA PACING AMPLITUDE: 1.5 V
MDC IDC SET LEADCHNL RV PACING PULSEWIDTH: 0.4 ms
MDC IDC STAT BRADY AS VS PERCENT: 100 %

## 2017-05-24 ENCOUNTER — Encounter (HOSPITAL_COMMUNITY)
Admission: RE | Admit: 2017-05-24 | Discharge: 2017-05-24 | Disposition: A | Discharge status: Home / Self Care | Payer: Medicare Other | Source: Ambulatory Visit | Attending: Cardiology | Admitting: Cardiology

## 2017-05-24 ENCOUNTER — Encounter (HOSPITAL_COMMUNITY): Payer: Self-pay

## 2017-05-24 VITALS — BP 102/52 | HR 71 | Ht 63.0 in | Wt 114.6 lb

## 2017-05-24 DIAGNOSIS — I5032 Chronic diastolic (congestive) heart failure: Secondary | ICD-10-CM | POA: Diagnosis present

## 2017-05-24 NOTE — Progress Notes (Signed)
Cardiac/Pulmonary Rehab Medication Review by a Pharmacist  Does the patient  feel that his/her medications are working for him/her?  Yes.  She had a gout flare when off colchicine so she restarted that. She hopes the increased dose of imdur will help with her SOB.  Has the patient been experiencing any side effects to the medications prescribed?  She feels she may have had adverse reaction to doxycycline which caused "blood blisters" in her mouth and nose.  Does the patient measure his/her own blood pressure or blood glucose at home?  yes   Does the patient have any problems obtaining medications due to transportation or finances?   no  Understanding of regimen: good Understanding of indications: good Potential of compliance: good   Pharmacist comments: [name] [name] uses a pill box to help her remember to take her medications.  She checks them carefully when she takes her pills.    [name] [name] 05/24/2017 1:58 PM

## 2017-05-24 NOTE — Progress Notes (Signed)
Daily Session Note  Patient Details  Name: [name] [name] MRN: [id number] Date of Birth: [birth year] Referring Provider:     PULMONARY REHAB OTHER RESP ORIENTATION from 05/24/2017 in [place]  Referring Provider  Dr. Aundra Dubin      Encounter Date: 05/24/2017  Check In:     Session Check In - 05/24/17 1230      Check-In   Location [facility]   Staff Present Juliano Mceachin Angelina Pih, MS, EP, [facility], Exercise Physiologist;Gregory Luther Parody, BS, EP, Exercise Physiologist;Debra Wynetta Emery, RN, BSN   Supervising physician immediately available to respond to emergencies See telemetry face sheet for immediately available MD   Medication changes reported     No   Fall or balance concerns reported    No   Tobacco Cessation --  Never smoked   Warm-up and Cool-down Performed as group-led instruction   Resistance Training Performed Yes   VAD Patient? No     Pain Assessment   Currently in Pain? No/denies   Pain Score 0-No pain   Multiple Pain Sites No      Capillary Blood Glucose: No results found. However, due to the size of the patient record, not all encounters were searched. Please check Results Review for a complete set of results.    History  Smoking Status  . Never Smoker  Smokeless Tobacco  . Never Used    Goals Met:  Independence with exercise equipment Exercise tolerated well No report of cardiac concerns or symptoms Strength training completed today  Goals Unmet:  Not Applicable  Comments: Check out 1500.   Dr. Kate Sable is Medical Director for [facility].

## 2017-05-24 NOTE — Progress Notes (Signed)
Cardiac Individual Treatment Plan  Patient Details  Name: [name] [name] MRN: [id number] Date of Birth: [birth year] Referring Provider:     PULMONARY REHAB OTHER RESP ORIENTATION from 05/24/2017 in [place]  Referring Provider  Dr. Aundra Dubin      Initial Encounter Date:    [place] from 05/24/2017 in [place]  Date  05/24/17  Referring Provider  Dr. Aundra Dubin      Visit Diagnosis: Diastolic CHF, chronic ([place])  Patient's Home Medications on Admission:  Current Outpatient Prescriptions:  .  azithromycin (ZITHROMAX) 500 MG tablet, 1 tablet by mouth one hour prior to dental appointment/procedure, Disp: 2 tablet, Rfl: 0 .  metolazone (ZAROXOLYN) 2.5 MG tablet, Take 2.5 mg (1 tab) once as directed by CHF clinic. Call before taking [phone], Disp: 3 tablet, Rfl: 0 .  nitroGLYCERIN (NITROLINGUAL) 0.4 MG/SPRAY spray, Place 1 spray under the tongue every 5 (five) minutes as needed for chest pain., Disp: 12 g, Rfl: 1 .  ALPRAZolam (XANAX) 0.5 MG tablet, Take 0.5 mg by mouth at bedtime. May take additional dose daily for anxiety, Disp: , Rfl:  .  amLODipine (NORVASC) 5 MG tablet, Take 0.5 tablets (2.5 mg total) by mouth daily., Disp: 45 tablet, Rfl: 3 .  APIDRA 100 UNIT/ML injection, Inject 46 Units into the skin as directed. INFUSE THROUGH INSULIN PUMP UTD::[place] 46.15 units daily, [place] depends on carb intake, Disp: , Rfl: 2 .  aspirin EC 81 MG tablet, Take 81 mg by mouth at bedtime., Disp: , Rfl:  .  Calcium Carbonate-Vitamin D (RA CALCIUM PLUS VITAMIN D) 600-400 MG-UNIT per tablet, Take 1 tablet by mouth daily. , Disp: , Rfl:  .  Cholecalciferol 2000 UNITS TABS, Take 1 tablet by mouth every morning. , Disp: , Rfl:  .  clobetasol (OLUX) 0.05 % topical foam, Apply 1 application topically daily., Disp: , Rfl: 0 .  Coenzyme Q10 (CO Q 10) 100 MG CAPS, Take 1 capsule by mouth at bedtime. , Disp: , Rfl:  .  colchicine  0.6 MG tablet, Take 0.6 mg by mouth at bedtime. And,Take 1 tablet at onset and repeate in 1 hour and then 1 daily x 2 (FOR FLARES) , Disp: , Rfl:  .  docusate sodium (COLACE) 100 MG capsule, Take 100 mg by mouth at bedtime., Disp: , Rfl:  .  Fe Fum-FA-B Cmp-C-Zn-Mg-Mn-Cu (HEMATINIC PLUS COMPLEX) 106-1 MG TABS, Take 1 tablet by mouth every Monday, Wednesday, and Friday. Take 1 tablet oraly three times a week or as directed daily with with a Vitamin C tablet, Disp: 90 each, Rfl: 0 .  glucagon (GLUCAGON EMERGENCY) 1 MG injection, Inject 1 mg into the vein once as needed (for severe reaction). Reported on 12/05/2015, Disp: , Rfl:  .  isosorbide mononitrate (IMDUR) 120 MG 24 hr tablet, Take 120 mg by mouth daily., Disp: , Rfl:  .  latanoprost (XALATAN) 0.005 % ophthalmic solution, Place 1 drop into both eyes at bedtime., Disp: , Rfl:  .  Magnesium Oxide 250 MG TABS, Take 250 mg by mouth at bedtime. , Disp: , Rfl:  .  metoprolol tartrate (LOPRESSOR) 50 MG tablet, TAKE 1 AND 1/2 TABLETS(75 MG) BY MOUTH TWICE DAILY, Disp: 270 tablet, Rfl: 0 .  Multiple Vitamins-Minerals (MULTIVITAMIN WITH MINERALS) tablet, Take 1 tablet by mouth every morning. , Disp: , Rfl:  .  pantoprazole (PROTONIX) 40 MG tablet, Take 1 tablet (40 mg total) by mouth daily., Disp: 30 tablet, Rfl: 11 .  Polyethyl Glycol-Propyl Glycol (SYSTANE) 0.4-0.3 % SOLN, Apply 1 drop to eye daily as needed (dry eyes)., Disp: , Rfl:  .  potassium chloride (MICRO-K) 10 MEQ CR capsule, Take 4 capsules (40 mEq total) by mouth 2 (two) times daily., Disp: 240 capsule, Rfl: 3 .  PRALUENT 150 MG/ML SOPN, INJECT 1 PEN INTO THE SKIN EVERY 14 (FOURTEEN) DAYS., Disp: 2 pen, Rfl: 3 .  pravastatin (PRAVACHOL) 10 MG tablet, TAKE 1 TABLET BY MOUTH ONCE DAILY, Disp: 90 tablet, Rfl: 3 .  spironolactone (ALDACTONE) 25 MG tablet, Take 0.5 tablets (12.5 mg total) by mouth daily., Disp: 15 tablet, Rfl: 3 .  ticagrelor (BRILINTA) 60 MG TABS tablet, TAKE 1 TABLET(60 MG) BY  MOUTH TWICE DAILY, Disp: 180 tablet, Rfl: 2 .  torsemide (DEMADEX) 20 MG tablet, Take 80 mg (4 tabs) in AM, then 60 mg (3 tabs) in PM, Disp: 210 tablet, Rfl: 6 .  triamcinolone cream (KENALOG) 0.5 %, Apply 1 application topically daily as needed (inflamation). , Disp: , Rfl:  .  ULORIC 40 MG tablet, Take 40 mg by mouth at bedtime. , Disp: , Rfl: 11 .  vitamin C (ASCORBIC ACID) 500 MG tablet, Take 500 mg by mouth 3 (three) times a week., Disp: , Rfl:   Past Medical History: Past Medical History:  Diagnosis Date  . Anxiety   . Aortic stenosis    a. bicuspid aortic valve; mean gradient of 20 mmHg in 2/10; b. s/p bioprosth AVR (19-mm Edwards pericardial valve) with a redo coronary artery bypass graft procedure in 07/2009; postoperative Dressler's syndrome    . Arthritis   . Carcinoma, renal cell ([place]) 05/2007   Laparoscopic right nephrectomy  . Chronic diastolic CHF (congestive heart failure) ([place])    a. 9.2014 EchoP EF 60-65%, no rwma, bioprosth AVR, mean gradient of 54mHg, mild to mod MR, PASP 532mg.  . CKD (chronic kidney disease), stage II   . Colitis, ischemic ([facility]  . Coronary artery disease    a. initial CABG with SVG-RCA in 12/05. b. redo SVG-RCA and bioprosthetic AVR in 1/11. d. 9/14 - PCI with DES to distal LM/proximal LAD was done rather than bypass as she would have been a poor candidate for redo sternotomy. d. S/p DES to prox LM 05/2014.  . Diabetes mellitus 03/2010    TYPE II: Hemoglobin A1c of 7.4 in  03/2010; 8.4 in 06/2010; treated with insulin pump  . Duodenal ulcer    Remote; H. pylori positive  . Gastroparesis due to DM ([facility]10/04/2012  . Genetic testing 01/14/2017   Ms. Koskela underwent genetic counseling and testing for hereditary cancer syndromes on 01/01/2017. Her results were negative for mutations in all 83 genes analyzed by [facility]'s 83-gene Common Hereditary Cancers Panel. Genes analyzed include: ALK, APC, ATM, AXIN2, BAP1, BARD1, BLM, BMPR1A, BRCA1, BRCA2,  BRIP1, CASR, CDC73, CDH1, CDK4, CDKN1B, CDKN1C, CDKN2A, CEBPA, CHEK2, CTNNA1, DICER1, DIS3L2,   . Hodgkin's disease ([facility]   M[place]le radiation therapy  . Hyperlipidemia   . Hyperplastic gastric polyp 06/23/2012  . Hypertension   . Iron deficiency anemia    a. 03/2013 EGD: essentially normal. Possible slow GIB.  . Migraines   . NASH (nonalcoholic steatohepatitis) 1999   -biopsy in 1999  . OSA on CPAP 04/24/2017   Moderate with AHI 22/hr now on CPAP at 18cm H2o  . Osteopenia     hip on DEXA in October 2007.  . Osteoporosis   . PAF (paroxysmal atrial fibrillation) ([facility]  a.  h/o brief episodes noted on ppm interrogation. b. given GI bleeding and need to be on both ASA 81 and Brilinta, risks likely outweigh benefits of anticoagulation.   . Small bowel obstruction ([facility])    Recurrent; resolved after resection of a lipoma  . Syncope    a. H/o recurrent syncope with pauses on loop recorder s/p Medtronic pacemaker 07/2012.    Tobacco Use: History  Smoking Status  . Never Smoker  Smokeless Tobacco  . Never Used    Labs: Recent Review Flowsheet Data    Labs for ITP Cardiac and Pulmonary Rehab Latest Ref Rng & Units 11/04/2015 04/27/2016 04/19/2017 04/19/2017 05/15/2017   Cholestrol 0 - 200 mg/dL 115(L) 125 - - 80   LDLCALC 0 - 99 mg/dL 33 42 - - 16   LDLDIRECT mg/dL - - - - -   HDL >40 mg/dL 41(L) 43(L) - - 40(L)   Trlycerides <150 mg/dL 207(H) 199(H) - - 119   Hemoglobin A1c 4.8 - 5.6 % - - - - -   PHART 7.350 - 7.450 - - - - -   PCO2ART 35.0 - 45.0 mmHg - - - - -   HCO3 20.0 - 28.0 mmol/L - - 28.1(H) 27.3 -   TCO2 22 - 32 mmol/L - - 29 29 -   ACIDBASEDEF 0.0 - 2.0 mmol/L - - - - -   O2SAT % - - 78.0 75.0 -      Capillary Blood Glucose: Lab Results  Component Value Date   GLUCAP 198 (H) 04/19/2017   GLUCAP 144 (H) 04/19/2017   GLUCAP 152 (H) 04/19/2017   GLUCAP 164 (H) 04/19/2017   GLUCAP 170 (H) 04/19/2017     Exercise Target Goals: Date: 05/24/17  Exercise Program  Goal: Individual exercise prescription set with THRR, safety & activity barriers. Participant demonstrates ability to understand and report RPE using BORG scale, to self-measure pulse accurately, and to acknowledge the importance of the exercise prescription.  Exercise Prescription Goal: Starting with aerobic activity 30 plus minutes a day, 3 days per week for initial exercise prescription. Provide home exercise prescription and guidelines that participant acknowledges understanding prior to discharge.  Activity Barriers & Risk Stratification:     Activity Barriers & Cardiac Risk Stratification - 05/24/17 1334      Activity Barriers & Cardiac Risk Stratification   Activity Barriers Deconditioning;Muscular Weakness;Shortness of Breath;Balance Concerns;History of Falls   Cardiac Risk Stratification High      6 Minute Walk:     6 Minute Walk    Row Name 05/24/17 1333         6 Minute Walk   Phase Initial     Distance 1050 feet     Distance % Change 0 %     Distance Feet Change 0 ft     Walk Time 6 minutes     # of Rest Breaks 0     MPH 1.98     METS 2.52     RPE 15     Perceived Dyspnea  13     VO2 Peak 9.79     Symptoms No     Resting HR 71 bpm     Resting BP 102/52     Resting Oxygen Saturation  96 %     Exercise Oxygen Saturation  during 6 min walk 95 %     Max Ex. HR 77 bpm     Max Ex. BP 120/60     2 Minute  Post BP 100/56        Oxygen Initial Assessment:     Oxygen Initial Assessment - 05/24/17 1504      Home Oxygen   Home Oxygen Device None   Sleep Oxygen Prescription None   Home Exercise Oxygen Prescription None   Home at Rest Exercise Oxygen Prescription None     Initial 6 min Walk   Oxygen Used None     Program Oxygen Prescription   Program Oxygen Prescription None      Oxygen Re-Evaluation:   Oxygen Discharge (Final Oxygen Re-Evaluation):   Initial Exercise Prescription:     Initial Exercise Prescription - 05/24/17 1400      Date  of Initial Exercise RX and Referring Provider   Date 05/24/17   Referring Provider Dr. Aundra Dubin     Treadmill   MPH 1.1   Grade 0   Minutes 15   METs 1.8     NuStep   Level 2   SPM 77   Minutes 20   METs 1.6     Prescription Details   Frequency (times per week) 3   Duration Progress to 30 minutes of continuous aerobic without signs/symptoms of physical distress     Intensity   THRR 40-80% of Max Heartrate 105-122-139   Ratings of Perceived Exertion 11-13   Perceived Dyspnea 0-4     Progression   Progression Continue progressive overload as per policy without signs/symptoms or physical distress.     Resistance Training   Training Prescription Yes   Weight 1   Reps 10-15      Perform Capillary Blood Glucose checks as needed.  Exercise Prescription Changes:   Exercise Comments:   Exercise Goals and Review:      Exercise Goals    Row Name 05/24/17 1335             Exercise Goals   Increase Physical Activity Yes       Intervention Provide advice, education, support and counseling about physical activity/exercise needs.;Develop an individualized exercise prescription for aerobic and resistive training based on initial evaluation findings, risk stratification, comorbidities and participant's personal goals.       Expected Outcomes Achievement of increased cardiorespiratory fitness and enhanced flexibility, muscular endurance and strength shown through measurements of functional capacity and personal statement of participant.       Increase Strength and Stamina Yes       Intervention Provide advice, education, support and counseling about physical activity/exercise needs.;Develop an individualized exercise prescription for aerobic and resistive training based on initial evaluation findings, risk stratification, comorbidities and participant's personal goals.       Expected Outcomes Achievement of increased cardiorespiratory fitness and enhanced flexibility, muscular  endurance and strength shown through measurements of functional capacity and personal statement of participant.       Able to understand and use rate of perceived exertion (RPE) scale Yes       Intervention Provide education and explanation on how to use RPE scale       Expected Outcomes Short Term: Able to use RPE daily in rehab to express subjective intensity level;Long Term:  Able to use RPE to guide intensity level when exercising independently       Able to understand and use Dyspnea scale Yes       Intervention Provide education and explanation on how to use Dyspnea scale       Expected Outcomes Short Term: Able to use Dyspnea scale daily in rehab to  express subjective sense of shortness of breath during exertion;Long Term: Able to use Dyspnea scale to guide intensity level when exercising independently       Knowledge and understanding of Target Heart Rate Range (THRR) Yes       Intervention Provide education and explanation of THRR including how the numbers were predicted and where they are located for reference       Expected Outcomes Short Term: Able to state/look up THRR       Able to check pulse independently Yes       Intervention Provide education and demonstration on how to check pulse in carotid and radial arteries.;Review the importance of being able to check your own pulse for safety during independent exercise       Expected Outcomes Short Term: Able to explain why pulse checking is important during independent exercise;Long Term: Able to check pulse independently and accurately       Understanding of Exercise Prescription Yes       Intervention Provide education, explanation, and written materials on patient's individual exercise prescription       Expected Outcomes Short Term: Able to explain program exercise prescription;Long Term: Able to explain home exercise prescription to exercise independently          Exercise Goals Re-Evaluation :    Discharge Exercise  Prescription (Final Exercise Prescription Changes):   Nutrition:  Target Goals: Understanding of nutrition guidelines, daily intake of sodium <1561m, cholesterol <2048m calories 30% from fat and 7% or less from saturated fats, daily to have 5 or more servings of fruits and vegetables.  Biometrics:     Pre Biometrics - 05/24/17 1425      Pre Biometrics   Height 5' 3"  (1.6 m)   Weight 114 lb 10.2 oz (52 kg)   Waist Circumference 30.5 inches   Hip Circumference 33.5 inches   Waist to Hip Ratio 0.91 %   BMI (Calculated) 20.31   Triceps Skinfold 8 mm   % Body Fat 27 %   Grip Strength 41.4 kg   Flexibility 10.33 in   Single Leg Stand 10 seconds       Nutrition Therapy Plan and Nutrition Goals:     Nutrition Therapy & Goals - 05/24/17 1504      Personal Nutrition Goals   Personal Goal #2 She is on a diabetic diet. Her diet is very specialized due to her gastrial intestinal problems.       Nutrition Discharge: Rate Your Plate Scores:     Nutrition Assessments - 05/24/17 1506      MEDFICTS Scores   Pre Score 9      Nutrition Goals Re-Evaluation:   Nutrition Goals Discharge (Final Nutrition Goals Re-Evaluation):   Psychosocial: Target Goals: Acknowledge presence or absence of significant depression and/or stress, maximize coping skills, provide positive support system. Participant is able to verbalize types and ability to use techniques and skills needed for reducing stress and depression.  Initial Review & Psychosocial Screening:     Initial Psych Review & Screening - 05/24/17 1509      Initial Review   Current issues with None Identified     Family Dynamics   Good Support System? Yes     Barriers   Psychosocial barriers to participate in program There are no identifiable barriers or psychosocial needs.     Screening Interventions   Interventions Encouraged to exercise      Quality of Life Scores:     Quality of Life -  05/24/17 1335       Quality of Life Scores   Health/Function Pre 13.17 %   Socioeconomic Pre 26.57 %   Psych/Spiritual Pre 22.93 %   Family Pre 30 %   GLOBAL Pre 20.41 %      PHQ-9: Recent Review Flowsheet Data    Depression screen [facility] 2/9 05/24/2017   Decreased Interest 0   Down, Depressed, Hopeless 0   PHQ - 2 Score 0   Altered sleeping 1   Tired, decreased energy 2   Change in appetite 1   Feeling bad or failure about yourself  0   Trouble concentrating 0   Moving slowly or fidgety/restless 0   Suicidal thoughts 0   PHQ-9 Score 4   Difficult doing work/chores Not difficult at all     Interpretation of Total Score  Total Score Depression Severity:  1-4 = Minimal depression, 5-9 = Mild depression, 10-14 = Moderate depression, 15-19 = Moderately severe depression, 20-27 = Severe depression   Psychosocial Evaluation and Intervention:     Psychosocial Evaluation - 05/24/17 1509      Psychosocial Evaluation & Interventions   Interventions Encouraged to exercise with the program and follow exercise prescription   Continue Psychosocial Services  No Follow up required      Psychosocial Re-Evaluation:   Psychosocial Discharge (Final Psychosocial Re-Evaluation):   Vocational Rehabilitation: Provide vocational rehab assistance to qualifying candidates.   Vocational Rehab Evaluation & Intervention:     Vocational Rehab - 05/24/17 1501      Initial Vocational Rehab Evaluation & Intervention   Assessment shows need for Vocational Rehabilitation No      Education: Education Goals: Education classes will be provided on a weekly basis, covering required topics. Participant will state understanding/return demonstration of topics presented.  Learning Barriers/Preferences:     Learning Barriers/Preferences - 05/24/17 1457      Learning Barriers/Preferences   Learning Barriers None   Learning Preferences Video;Written Material;Pictoral;Computer/Internet      Education  Topics: Hypertension, Hypertension Reduction -Define heart disease and high blood pressure. Discus how high blood pressure affects the body and ways to reduce high blood pressure.   Exercise and Your Heart -Discuss why it is important to exercise, the FITT principles of exercise, normal and abnormal responses to exercise, and how to exercise safely.   Angina -Discuss definition of angina, causes of angina, treatment of angina, and how to decrease risk of having angina.   Cardiac Medications -Review what the following cardiac medications are used for, how they affect the body, and side effects that may occur when taking the medications.  Medications include Aspirin, Beta blockers, calcium channel blockers, ACE Inhibitors, angiotensin receptor blockers, diuretics, digoxin, and antihyperlipidemics.   Congestive Heart Failure -Discuss the definition of CHF, how to live with CHF, the signs and symptoms of CHF, and how keep track of weight and sodium intake.   Heart Disease and Intimacy -Discus the effect sexual activity has on the heart, how changes occur during intimacy as we age, and safety during sexual activity.   Smoking Cessation / COPD -Discuss different methods to quit smoking, the health benefits of quitting smoking, and the definition of COPD.   Nutrition I: Fats -Discuss the types of cholesterol, what cholesterol does to the heart, and how cholesterol levels can be controlled.   Nutrition II: Labels -Discuss the different components of food labels and how to read food label   Heart Parts and Heart Disease -Discuss the anatomy of the  heart, the pathway of blood circulation through the heart, and these are affected by heart disease.   Stress I: Signs and Symptoms -Discuss the causes of stress, how stress may lead to anxiety and depression, and ways to limit stress.   Stress II: Relaxation -Discuss different types of relaxation techniques to limit stress.   Warning  Signs of Stroke / TIA -Discuss definition of a stroke, what the signs and symptoms are of a stroke, and how to identify when someone is having stroke.   Knowledge Questionnaire Score:     Knowledge Questionnaire Score - 05/24/17 1500      Knowledge Questionnaire Score   Pre Score 23/24      Core Components/Risk Factors/Patient Goals at Admission:     Personal Goals and Risk Factors at Admission - 05/24/17 1507      Core Components/Risk Factors/Patient Goals on Admission    Weight Management Weight Maintenance   Improve shortness of breath with ADL's Yes   Intervention Provide education, individualized exercise plan and daily activity instruction to help decrease symptoms of SOB with activities of daily living.   Expected Outcomes Short Term: Achieves a reduction of symptoms when performing activities of daily living.   Personal Goal Other Yes   Personal Goal Increase strength and stamina, improve breathing and decrease chest discomfort with exertion. Be able to start traveling in their RV again.    Intervention Attend CR 2 x week and supplement with at home exercise 3 x week.    Expected Outcomes Reach personal goals.       Core Components/Risk Factors/Patient Goals Review:    Core Components/Risk Factors/Patient Goals at Discharge (Final Review):    ITP Comments:     ITP Comments    Row Name 05/24/17 1502           ITP Comments Mrs. Tostenson has been in the program before as a heart patient. She is a brittle diabetic with a insulin pump. She gets dizzy if overly exerted. We will gradually increase her exercise work loads.           Comments: Patient arrived for 1st visit/orientation/education at 1230. Patient was referred to PR by Dr. Aundra Dubin due to Diastolic CHF (Q94.76). During orientation advised patient on arrival and appointment times what to wear, what to do before, during and after exercise. Reviewed attendance and class policy. Talked about inclement weather  and class consultation policy. Pt is scheduled to return Pulmonary Rehab on 05/28/16 at 10:45. Pt was advised to come to class 15 minutes before class starts. Patient was also given instructions on meeting with the dietician and attending the Family Structure classes. Discussed RPE/Dpysnea scales. Discussed initial THR and how to find their radial and/or carotid pulse. Discussed the initial exercise prescription and how this effects their progress. Pt is eager to get started. Patient participated in warm up stretches followed by light weights and resistance bands. Patient was able to complete 6 minute walk test. Patient c/o a choking sensation with nausea and dizziness near end of test. Symptoms  subsided after rest. Patient was measured for the equipment. Discussed equipment safety with patient. Took patient pre-anthropometric measurements. Patient finished visit at 1500.

## 2017-05-27 ENCOUNTER — Ambulatory Visit (HOSPITAL_COMMUNITY)
Admission: RE | Admit: 2017-05-27 | Discharge: 2017-05-27 | Disposition: A | Discharge status: Home / Self Care | Payer: Medicare Other | Source: Ambulatory Visit | Attending: Cardiology | Admitting: Cardiology

## 2017-05-27 ENCOUNTER — Encounter (HOSPITAL_COMMUNITY): Payer: Self-pay | Admitting: Cardiology

## 2017-05-27 VITALS — BP 108/62 | HR 71 | Wt 113.4 lb

## 2017-05-27 DIAGNOSIS — G4733 Obstructive sleep apnea (adult) (pediatric): Secondary | ICD-10-CM | POA: Insufficient documentation

## 2017-05-27 DIAGNOSIS — Z955 Presence of coronary angioplasty implant and graft: Secondary | ICD-10-CM | POA: Diagnosis not present

## 2017-05-27 DIAGNOSIS — Z8673 Personal history of transient ischemic attack (TIA), and cerebral infarction without residual deficits: Secondary | ICD-10-CM | POA: Insufficient documentation

## 2017-05-27 DIAGNOSIS — Z8249 Family history of ischemic heart disease and other diseases of the circulatory system: Secondary | ICD-10-CM | POA: Insufficient documentation

## 2017-05-27 DIAGNOSIS — Z9013 Acquired absence of bilateral breasts and nipples: Secondary | ICD-10-CM | POA: Insufficient documentation

## 2017-05-27 DIAGNOSIS — Z794 Long term (current) use of insulin: Secondary | ICD-10-CM | POA: Diagnosis not present

## 2017-05-27 DIAGNOSIS — Z853 Personal history of malignant neoplasm of breast: Secondary | ICD-10-CM | POA: Insufficient documentation

## 2017-05-27 DIAGNOSIS — Z905 Acquired absence of kidney: Secondary | ICD-10-CM | POA: Insufficient documentation

## 2017-05-27 DIAGNOSIS — I495 Sick sinus syndrome: Secondary | ICD-10-CM | POA: Diagnosis not present

## 2017-05-27 DIAGNOSIS — I129 Hypertensive chronic kidney disease with stage 1 through stage 4 chronic kidney disease, or unspecified chronic kidney disease: Secondary | ICD-10-CM | POA: Insufficient documentation

## 2017-05-27 DIAGNOSIS — I5032 Chronic diastolic (congestive) heart failure: Secondary | ICD-10-CM | POA: Diagnosis not present

## 2017-05-27 DIAGNOSIS — Z953 Presence of xenogenic heart valve: Secondary | ICD-10-CM | POA: Insufficient documentation

## 2017-05-27 DIAGNOSIS — I130 Hypertensive heart and chronic kidney disease with heart failure and stage 1 through stage 4 chronic kidney disease, or unspecified chronic kidney disease: Secondary | ICD-10-CM | POA: Insufficient documentation

## 2017-05-27 DIAGNOSIS — Z7982 Long term (current) use of aspirin: Secondary | ICD-10-CM | POA: Insufficient documentation

## 2017-05-27 DIAGNOSIS — E1022 Type 1 diabetes mellitus with diabetic chronic kidney disease: Secondary | ICD-10-CM | POA: Diagnosis not present

## 2017-05-27 DIAGNOSIS — Z79899 Other long term (current) drug therapy: Secondary | ICD-10-CM | POA: Insufficient documentation

## 2017-05-27 DIAGNOSIS — E1043 Type 1 diabetes mellitus with diabetic autonomic (poly)neuropathy: Secondary | ICD-10-CM | POA: Insufficient documentation

## 2017-05-27 DIAGNOSIS — Z950 Presence of cardiac pacemaker: Secondary | ICD-10-CM | POA: Diagnosis not present

## 2017-05-27 DIAGNOSIS — Z951 Presence of aortocoronary bypass graft: Secondary | ICD-10-CM | POA: Diagnosis not present

## 2017-05-27 DIAGNOSIS — Z8711 Personal history of peptic ulcer disease: Secondary | ICD-10-CM | POA: Insufficient documentation

## 2017-05-27 DIAGNOSIS — Z8553 Personal history of malignant neoplasm of renal pelvis: Secondary | ICD-10-CM | POA: Insufficient documentation

## 2017-05-27 DIAGNOSIS — Z8571 Personal history of Hodgkin lymphoma: Secondary | ICD-10-CM | POA: Insufficient documentation

## 2017-05-27 DIAGNOSIS — N183 Chronic kidney disease, stage 3 unspecified: Secondary | ICD-10-CM

## 2017-05-27 DIAGNOSIS — D696 Thrombocytopenia, unspecified: Secondary | ICD-10-CM | POA: Insufficient documentation

## 2017-05-27 DIAGNOSIS — I2510 Atherosclerotic heart disease of native coronary artery without angina pectoris: Secondary | ICD-10-CM

## 2017-05-27 DIAGNOSIS — I25110 Atherosclerotic heart disease of native coronary artery with unstable angina pectoris: Secondary | ICD-10-CM | POA: Diagnosis not present

## 2017-05-27 DIAGNOSIS — D649 Anemia, unspecified: Secondary | ICD-10-CM | POA: Insufficient documentation

## 2017-05-27 DIAGNOSIS — Z7902 Long term (current) use of antithrombotics/antiplatelets: Secondary | ICD-10-CM | POA: Insufficient documentation

## 2017-05-27 DIAGNOSIS — M109 Gout, unspecified: Secondary | ICD-10-CM | POA: Diagnosis not present

## 2017-05-27 DIAGNOSIS — E785 Hyperlipidemia, unspecified: Secondary | ICD-10-CM | POA: Diagnosis not present

## 2017-05-27 DIAGNOSIS — I480 Paroxysmal atrial fibrillation: Secondary | ICD-10-CM | POA: Diagnosis not present

## 2017-05-27 DIAGNOSIS — K3184 Gastroparesis: Secondary | ICD-10-CM | POA: Diagnosis not present

## 2017-05-27 LAB — BASIC METABOLIC PANEL
ANION GAP: 9 (ref 5–15)
BUN: 35 mg/dL — ABNORMAL HIGH (ref 6–20)
CALCIUM: 10 mg/dL (ref 8.9–10.3)
CO2: 29 mmol/L (ref 22–32)
Chloride: 99 mmol/L — ABNORMAL LOW (ref 101–111)
Creatinine, Ser: 1.46 mg/dL — ABNORMAL HIGH (ref 0.44–1.00)
GFR calc Af Amer: 43 mL/min — ABNORMAL LOW (ref >60)
GFR, EST NON AFRICAN AMERICAN: 37 mL/min — AB (ref >60)
GLUCOSE: 169 mg/dL — AB (ref 65–99)
POTASSIUM: 4.1 mmol/L (ref 3.5–5.1)
SODIUM: 137 mmol/L (ref 135–145)

## 2017-05-27 NOTE — Progress Notes (Signed)
Patient ID: [name] [name], female   DOB: January 30, 1951, 66 y.o.   MRN: [id number] PCP: Dr. Hilma Favors Cardiology: Dr. Aundra Dubin  66 yo with history of CAD s/p CABG in 12/05 and redo in 1/11, bioprosthetic AVR, diastolic CHF, and sick sinus syndrome s/p PCM presents for cardiology followup.  She had initial CABG with SVG-RCA in 12/05 followed by redo SVG-RCA and bioprosthetic AVR in 1/11.  She has had diastolic CHF and is on Lasix.  In 9/14, she had a CPX test.  This showed severe functional limitation with ischemic ECG changes (inferolateral ST depression and ST elevation in V1 and V2). She had chest pain.  She had R/LHC in 9/14.  This showed elevated left and right heart filling pressures, patent SVG-PDA, and 80% distal LM/80% ostial LAD stenosis.  She was not a candidate for redo CABG (had 2 prior sternotomies as well as chest radiation for Hodgkins lymphoma).  She had DES from left main into proximal LAD and will need long-term DAPT => Brilinta was used as she has been a poor responder to Plavix in the past.  She re-developed angina in 11/15 and was admitted with unstable angina.  LHC showed 80% ostial LM and patent SVG-RCA. She had DES to ostial LM. Echo in 1/18 showed EF 60-65%, normal bioprosthetic aortic valve, mild-moderate MR, PASP 35 mmHg.   She has chronic iron deficiency anemia thought to be due to a slow GI bleed.  Last EGD was unremarkable and a capsule endoscopy was also unremarkable.  She has had periodic transfusions; However, recently hemoglobin has been stable.    She had bilateral mastectomy for DCIS in 37/34 without complication.  She has an insulin pump followed by an endocrinologist at [facility].   Given myalgias with statins, she was started on Praluent.  LDL is now excellent.    In the past she has had short runs of atrial fibrillation.  She is not anticoagulated.  Planned for Watchman placement, but given her nickel allergy, we were unable to place a Watchman.    Cardiolite in 2/18  showed no ischemia or infarction.     Diagnosed with OSA, now on CPAP.   Given ongoing problems with exertional dyspnea and occasional chest pain, Cardiolite was repeated in 9/18 and showed inferior changes.  She underwent left/right heart cath in 9/18.  This showed elevated filling pressures, R>L.  There was 75+% ostial LCx stenosis.  However, there were 2 layers of stent from the mid left main into the ostial LAD, and LCx intervention was thought to be very risky.  It was decided to manage her medically.  Echo was repeated in 10/18 showing EF 55-60%, moderate to severe TR.    She returns for followup of CHF and CAD today.  At last appt, she was reporting rigors and generalized flu-like symptoms.  No definite site of infection was found, but she felt much better after PCP gave her a course of levofloxacin (finished today).  No further rigors.  She has started pulmonary rehab.  Weight is down 4 lbs on higher torsemide.  She still fatigues easily.  Short of breath after walking 3-4 minutes.  Dyspnea with stairs.  No chest pain. No falls, no syncope. Rare palpitations.  Occasional lightheadedness on longer walks.      Labs (7/14): hemoglobin 11 Labs (9/14): K 3.9, creatinine 1.48, hemoglobin 7.3 Labs (10/14): hemoglobin 7.7 Labs (11/14): K 3.8, creatinine 1.5 Labs (12/14): HCT 37.7 Labs (1/15): LDL 106, HDL 41, K 3.9, creatinine 1.6,  LFTs normal Labs (2/15): K 3.7, creatinine 1.1, BNP 71 Labs (3/15): HCT 36.7 Labs (4/15): K 3.7, creatinine 1.1, HCT 35.5 Labs (7/15): HCT 36.4 Labs (11/15): HCT 32.1, K 4.2, creatinine 1.06 Labs (12/15): hgb 12 Labs (1/16): HCT 37.1 Labs (3/16): LDL 25, HDL 42, elevated transaminases Labs (6/16): HCT 39.6, plts 146 Labs (7/16): LDL 59, HDL 41, AST 34, ALT 49, AP 125 Labs (11/16): HCT 38.8, alkaline phosphatase 156, AST/ALT normal, K 4, creatinine 1.0 Labs (3/17): K 4.4, creatinine 1.4, LFTs normal, hgb 12.5 Labs (5/17): HCT 41.8, K 4, creatinine 1.3 Labs  (9/17): K 4, creatinine 0.43, hgb 13.6 Labs (10/17): LDL 42, HDL 43 Labs (1/18): K 4.3, creatinine 1.1 => 1.19, AST 66, ALT 67, hgb 14.2, BNP 72 Labs (2/18): K 3.9, creatinine 1.2, BNP 49.6, HCT 42.1 Labs (4/18): hgb 13.8 Labs (5/18): HCT 41.7, plts 98, K 4.3, creatinine 1.11, BNP 180 Labs (7/18): hgb 12, plts 110, K 4, creatinine 1.23, AST 38, ALT 44 Labs (8/18): K 5, creatinine 1.17 => 2.1, BUN 33 => 92, BNP 134 Labs (9/18): K 3.6, creatinine 1.42 => 1.44. Hgb 14 => 13.5 Labs (10/18): K 3.5, creatinine 1.21, hgb 11.9  PMH: 1. Sick sinus syndrome s/p [employer] PCM.  2. HTN 3. H/o SBO 4. Renal cell carcinoma s/p right nephrectomy in 2008.  5. 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  Fibroscan in 11/14 showed advanced fibrosis concerning for cirrhosis. EGD in 11/14 showed no varices.  6. Diastolic CHF: Echo (1/44) with EF 60-65%, bioprosthetic aortic valve with mean gradient 15 mmHg, mild MR.  CPX (9/14): peak VO2 10.4, VE/VCO2 43.8, inferolateral ST depression and ST elevation in V1/V2 with severe dyspnea and chest pain, severe functional limitation with ischemic changes.  Echo (9/14) with EF 60-65%, normal RV size and systolic function, mild-moderate MR, bioprosthetic aortic valve normal, PA systolic pressure 51 mmHg, no evidence for pericardial constriction though exam incomplete for constriction.  Echo (11/15) with EF 55-60%, bioprosthetic aortic valve, mild-moderate MR, moderate TR, PA systolic pressure 42 mmHg.  - TEE (5/17) with EF 60-65%, normal bioprosthetic aortic valve with mean gradient 13 mmHg, normal RV size and systolic function. - Echo (1/18) with EF 60-65%, normal bioprosthetic aortic valve, mild-moderate MR, PASP 35 mmHg.  - Echo (10/18) with EF 55-60%, bioprosthetic aortic valve mean gradient 10 mmHg, MAC with mild mitral stenosis (mean gradient 7) and mild mitral regurgitation, moderate to severe TR, PASP 43 mmHg.  - RHC (9/18): mean RA 13, PA 44/18 mean 33, mean PCWP 19, CI  3.45, PVR 2.7 WU.  7. TAH-BSO 8. Type I diabetes: Has insulin pump.  9. H/o ischemic colitis. 10. H/o PUD. 11. Diabetic gastroparesis. 12. Hyperlipidemia: Myalgias with Crestor and atorvastatin, elevated LFTs with simvastatin.  Myalgias with > 10 mg pravastatin. Now on Praluent.  13. CKD 14. Bicuspid aortic valve s/p 19 mm Edwards pericardial valve in 1/11 (had post-operative Dresslers syndrome).   15. CAD: CABG with SVG-RCA in 12/05.  Redo SVG-RCA in 1/11 with AVR.  LHC/RHC (9/14) with mean RA 18, PA 62/25 mean 43, mean PCWP 26, CI 2.8, 70-80% distal LM stenosis, 80% ostial LAD stenosis, total occlusion RCA, SVG-PDA patent.  Patient had DES LM into LAD.  Plan to continue long-term ASA/Brilinta (poor responder to Plavix).  ETT-cardiolite (4/15) with 6' exercise, EF 60%, ST depression in recovery and mild chest pain, no ischemia or infarction by perfusion images.  Unstable angina 11/15 with 80% LM, patent SVG-RCA => DES to ostial LM.   -  Lexiscan Cardiolite (2/18): EF 62%, no ischemia or infarction (normal).  She had a significant reaction to [place] and probably should not get again.  - Cardiolite (9/18): EF 72%, prior inferior infarct with mild peri-infarct ischemia.  - LHC (9/18): 2 layers stent left main - ostial LAD with 50% dLM in-stent restenosis, 75+% ostial LCx, totally occluded RCA, SVG-RCA patent.  Medical management planned.  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  17. Anemia: Iron-deficiency.  Suspect chronic GI blood loss.  EGD in 9/14 was unremarkable.  Capsule endoscopy 10/14 was unremarkable.  Has required periodic blood transfusions.  18. Atrial fibrillation: Paroxysmal, noted by [facility] interrogation.  19. Carotid stenosis: Carotid dopplers (2/15) with 40-59% bilateral stenosis. Carotid dopplers (3/16) with 40-59% bilateral ICA stenosis. Carotid dopplers (3/17) with 40-59% bilateral ICA stenosis.  - Carotid dopplers (3/18) with mild BICA  stenosis.  20. 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  22. C difficile colitis 2017 23. Gout 24. ?TIA: head CT negative.  25. OSA: Moderate, uses CPAP.  26. Chronic thrombocytopenia  SH: Married, lives in [place], nonsmoker. Former school principal.  FH: CAD  ROS: All systems reviewed and negative except as per HPI.   Current Outpatient Medications  Medication Sig Dispense Refill  . ALPRAZolam (XANAX) 0.5 MG tablet Take 0.5 mg by mouth at bedtime. May take additional dose daily for anxiety    . amLODipine (NORVASC) 5 MG tablet Take 0.5 tablets (2.5 mg total) by mouth daily. 45 tablet 3  . APIDRA 100 UNIT/ML injection Inject 46 Units into the skin as directed. INFUSE THROUGH INSULIN PUMP UTD::[place] 46.15 units daily, Bolus depends on carb intake  2  . aspirin EC 81 MG tablet Take 81 mg by mouth at bedtime.    . Calcium Carbonate-Vitamin D (RA CALCIUM PLUS VITAMIN D) 600-400 MG-UNIT per tablet Take 1 tablet by mouth daily.     . Cholecalciferol 2000 UNITS TABS Take 1 tablet by mouth every morning.     . clobetasol (OLUX) 0.05 % topical foam Apply 1 application topically daily.  0  . Coenzyme Q10 (CO Q 10) 100 MG CAPS Take 1 capsule by mouth at bedtime.     . colchicine 0.6 MG tablet Take 0.6 mg by mouth at bedtime. And,Take 1 tablet at onset and repeate in 1 hour and then 1 daily x 2 (FOR FLARES)     . docusate sodium (COLACE) 100 MG capsule Take 100 mg by mouth at bedtime.    . Fe Fum-FA-B Cmp-C-Zn-Mg-Mn-Cu (HEMATINIC PLUS COMPLEX) 106-1 MG TABS Take 1 tablet by mouth every Monday, Wednesday, and Friday. Take 1 tablet oraly three times a week or as directed daily with with a Vitamin C tablet 90 each 0  . glucagon (GLUCAGON EMERGENCY) 1 MG injection Inject 1 mg into the vein once as needed (for severe reaction). Reported on 12/05/2015    . isosorbide mononitrate  (IMDUR) 120 MG 24 hr tablet Take 120 mg by mouth daily.    Marland Kitchen latanoprost (XALATAN) 0.005 % ophthalmic solution Place 1 drop into both eyes at bedtime.    . Magnesium Oxide 250 MG TABS Take 250 mg by mouth at bedtime.     . metolazone (ZAROXOLYN) 2.5 MG tablet Take 2.5 mg (1 tab) once as directed by CHF clinic. Call before taking [phone] 3 tablet 0  . metoprolol tartrate (LOPRESSOR) 50 MG tablet  TAKE 1 AND 1/2 TABLETS(75 MG) BY MOUTH TWICE DAILY 270 tablet 0  . Multiple Vitamins-Minerals (MULTIVITAMIN WITH MINERALS) tablet Take 1 tablet by mouth every morning.     . nitroGLYCERIN (NITROLINGUAL) 0.4 MG/SPRAY spray Place 1 spray under the tongue every 5 (five) minutes as needed for chest pain. 12 g 1  . pantoprazole (PROTONIX) 40 MG tablet Take 1 tablet (40 mg total) by mouth daily. 30 tablet 11  . Polyethyl Glycol-Propyl Glycol (SYSTANE) 0.4-0.3 % SOLN Apply 1 drop to eye daily as needed (dry eyes).    . potassium chloride (MICRO-K) 10 MEQ CR capsule Take 4 capsules (40 mEq total) by mouth 2 (two) times daily. 240 capsule 3  . PRALUENT 150 MG/ML SOPN INJECT 1 PEN INTO THE SKIN EVERY 14 (FOURTEEN) DAYS. 2 pen 3  . pravastatin (PRAVACHOL) 10 MG tablet TAKE 1 TABLET BY MOUTH ONCE DAILY 90 tablet 3  . spironolactone (ALDACTONE) 25 MG tablet Take 0.5 tablets (12.5 mg total) by mouth daily. 15 tablet 3  . ticagrelor (BRILINTA) 60 MG TABS tablet TAKE 1 TABLET(60 MG) BY MOUTH TWICE DAILY 180 tablet 2  . torsemide (DEMADEX) 20 MG tablet Take 80 mg (4 tabs) in AM, then 60 mg (3 tabs) in PM 210 tablet 6  . triamcinolone cream (KENALOG) 0.5 % Apply 1 application topically daily as needed (inflamation).     Marland Kitchen ULORIC 40 MG tablet Take 40 mg by mouth at bedtime.   11  . vitamin C (ASCORBIC ACID) 500 MG tablet Take 500 mg by mouth 3 (three) times a week.    Marland Kitchen azithromycin (ZITHROMAX) 500 MG tablet 1 tablet by mouth one hour prior to dental appointment/procedure (Patient not taking: Reported on 05/27/2017) 2  tablet 0   No current facility-administered medications for this encounter.     BP 108/62   Pulse 71   Wt 113 lb 6.4 oz (51.4 kg)   SpO2 98%   BMI 20.09 kg/m  General: NAD Neck: No JVD, no thyromegaly or thyroid nodule.  Lungs: Decreased breath sounds right base.  CV: Nondisplaced PMI.  Heart regular S1/S2, no S3/S4, no murmur.  No peripheral edema.  No carotid bruit.  Normal pedal pulses.  Abdomen: Soft, nontender, no hepatosplenomegaly, no distention.  Skin: Intact without lesions or rashes.  Neurologic: Alert and oriented x 3.  Psych: Normal affect. Extremities: No clubbing or cyanosis.  HEENT: Normal.   Assessment/Plan:  1. CAD: s/p SVG-RCA in 12/05, then redo SVG-RCA in 1/11--> distal left main/proximal LAD severe stenosis in 9/14.  PCI with DES to distal LM/proximal LAD was done rather than bypass as she would have been a poor candidate for redo sternotomy (2 prior sternotomies and history of chest wall radiation for Hodgkins disease).  Recurrent unstable angina with 80% ostial LM treated with repeat DES in 11/15.  Lexiscan Cardiolite in 9/18 inferior changes.  Repeat LHC (9/18) showed 50% distal left main in-stent restenosis and 75+% ostial LCx stenosis. I reviewed the films with interventional cardiology => would be very difficult to intervene on the ostial LCx through 2 overlapping stents from left main into the LAD.  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  Given no ACS, we planned medical management.  I increased her Imdur.  She has had minimal chest pain.  - Continue Brilinta 60 mg bid (poor responder to plavix) long-term given recurrent left main disease.  This has been complicated in the past by suspected slow GI bleed.  Most recent hemoglobin was normal. - Continue ASA 81, statin, metoprolol, and Imdur 120 mg daily.    2. Bioprosthetic aortic valve replacement: In setting of bicuspid aortic valve, thoracic aorta not dilated on 2017 TEE.   Stable on 10/18 echo.   3. Chronic diastolic CHF: Most recent echo in 10/18 showed normal LV systolic function but also showed moderate to severe TR.  RHC was concerning for right > left heart failure.  I am concerned that her volume overload and dyspnea are related to primarily RV failure at this point. NYHA class II-III symptoms.  Weight is down and volume looks ok on exam.     - Continue torsemide 80 qam/60 qpm.  BMET today.   - Continue spironolactone 12.5 mg daily, there is some evidence for benefit in diastolic CHF patients.  4. CKD:  Patient has a single kidney s/p right nephrectomy.  CKD stage 3.  BMET today.  5. Hyperlipidemia: She has not been able to tolerate any higher potency statin than pravastatin 10 mg daily.   - Continue Praluent, good lipids 10/18. .        6. Anemia: Suspected slow GI bleed.  Unremarkable EGD and capsule endoscopy in fall 2014.  Unfortunately, needs to stay on Brilinta long-term (Plavix non-responder).  Will need to follow closely (sees hematology). Recently, hemoglobin has been more stable.   7. Atrial fibrillation: Paroxysmal, brief episodes noted on PCM interrogation.  Given GI bleeding and need to be on Brilinta, risks have been thought to outweigh benefits of anticoagulation.  She is in NSR today.  Less palpitations now that she is using CPAP.  She is not a good Multaq candidate with CHF.  If she needs an antiarrhythmic, consider Tikosyn or Sotalol.  - We were unable to place Watchman device due to nickel allergy.   8. Carotid bruit: Only mild stenosis on last dopplers in 3/18. 9. SSS: Pacemaker in place.  10. HTN: controlled. Continue current regimen.   11. OSA: Moderate, using CPAP.  12. ?TIA: Transient dysarthria in 3/18, could have been afib-related TIA.  No recurrent symptoms.  13. Thrombocytopenia: Chronic.  Needs to continue to follow with heme/onc.    Followup in 6 wks.   [name] 05/27/2017

## 2017-05-27 NOTE — Patient Instructions (Signed)
Labs drawn today (if we do not call you, then your lab work was stable)   Your physician recommends that you schedule a follow-up appointment in: 6 weeks

## 2017-05-28 ENCOUNTER — Encounter: Payer: Self-pay | Admitting: Cardiology

## 2017-05-28 ENCOUNTER — Encounter (HOSPITAL_COMMUNITY)
Admission: RE | Admit: 2017-05-28 | Discharge: 2017-05-28 | Disposition: A | Discharge status: Home / Self Care | Payer: Medicare Other | Source: Ambulatory Visit | Attending: Cardiology | Admitting: Cardiology

## 2017-05-28 DIAGNOSIS — I5032 Chronic diastolic (congestive) heart failure: Secondary | ICD-10-CM | POA: Diagnosis not present

## 2017-05-28 NOTE — Progress Notes (Signed)
Daily Session Note  Patient Details  Name: [name] [name] MRN: [id number] Date of Birth: June 09, 1951 Referring Provider:     PULMONARY REHAB OTHER RESP ORIENTATION from 05/24/2017 in [place]  Referring Provider  Dr. Aundra Dubin      Encounter Date: 05/28/2017  Check In: Session Check In - 05/28/17 1045      Check-In   Location  [facility]    Staff Present  Charly Hunton Angelina Pih, MS, EP, [facility], Exercise Physiologist;Gregory Luther Parody, BS, EP, Exercise Physiologist    Supervising physician immediately available to respond to emergencies  See telemetry face sheet for immediately available MD    Medication changes reported      No    Fall or balance concerns reported     No    Tobacco Cessation  No Change    Warm-up and Cool-down  Performed as group-led instruction    Resistance Training Performed  Yes    VAD Patient?  No      Pain Assessment   Currently in Pain?  No/denies    Pain Score  0-No pain    Multiple Pain Sites  No       Capillary Blood Glucose: No results found. However, due to the size of the patient record, not all encounters were searched. Please check Results Review for a complete set of results.    Social History   Tobacco Use  Smoking Status Never Smoker  Smokeless Tobacco Never Used    Goals Met:  Proper associated with RPD/PD & O2 Sat Independence with exercise equipment Improved SOB with ADL's Exercise tolerated well No report of cardiac concerns or symptoms Strength training completed today  Goals Unmet:  Not Applicable  Comments: Check 11:45   Dr. Kate Sable is Medical Director for [place] and Pulmonary Rehab.

## 2017-05-29 NOTE — Progress Notes (Signed)
Patient received the take home exercise plan today 05/28/2017. THR was addressed as were safe ways to be active outside of Rehab. Patient demonstrated an understanding. Patient was encouraged to ask any future questions.

## 2017-05-30 ENCOUNTER — Encounter (HOSPITAL_COMMUNITY)
Admission: RE | Admit: 2017-05-30 | Discharge: 2017-05-30 | Disposition: A | Discharge status: Home / Self Care | Payer: Medicare Other | Source: Ambulatory Visit | Attending: Cardiology | Admitting: Cardiology

## 2017-05-30 DIAGNOSIS — I5032 Chronic diastolic (congestive) heart failure: Secondary | ICD-10-CM

## 2017-05-30 NOTE — Progress Notes (Signed)
Daily Session Note  Patient Details  Name: [name] [name] MRN: [id number] Date of Birth: [birth year] Referring Provider:     PULMONARY REHAB OTHER RESP ORIENTATION from 05/24/2017 in [place]  Referring Provider  Dr. Aundra Dubin      Encounter Date: 05/30/2017  Check In: Session Check In - 05/30/17 1045      Check-In   Location  [facility]    Staff Present  Suzanne Boron, BS, EP, Exercise Physiologist;Kelani Robart Wynetta Emery, RN, BSN    Supervising physician immediately available to respond to emergencies  See telemetry face sheet for immediately available MD    Medication changes reported      No    Fall or balance concerns reported     No    Warm-up and Cool-down  Performed as group-led instruction    Resistance Training Performed  Yes    VAD Patient?  No      Pain Assessment   Currently in Pain?  No/denies    Pain Score  0-No pain    Multiple Pain Sites  No       Capillary Blood Glucose: No results found. However, due to the size of the patient record, not all encounters were searched. Please check Results Review for a complete set of results.    Social History   Tobacco Use  Smoking Status Never Smoker  Smokeless Tobacco Never Used    Goals Met:  Proper associated with RPD/PD & O2 Sat Independence with exercise equipment Improved SOB with ADL's Using PLB without cueing & demonstrates good technique Exercise tolerated well No report of cardiac concerns or symptoms Strength training completed today  Goals Unmet:  Not Applicable  Comments: Check out 1145.   Dr. Sinda Du is Medical Director for [facility] Pulmonary Rehab.

## 2017-06-04 ENCOUNTER — Encounter (HOSPITAL_COMMUNITY)
Admission: RE | Admit: 2017-06-04 | Discharge: 2017-06-04 | Disposition: A | Discharge status: Home / Self Care | Payer: Medicare Other | Source: Ambulatory Visit | Attending: Cardiology | Admitting: Cardiology

## 2017-06-04 DIAGNOSIS — I5032 Chronic diastolic (congestive) heart failure: Secondary | ICD-10-CM | POA: Diagnosis not present

## 2017-06-04 NOTE — Progress Notes (Signed)
Daily Session Note  Patient Details  Name: [name] [name] MRN: [id number] Date of Birth: 11-14-1950 Referring Provider:     PULMONARY REHAB OTHER RESP ORIENTATION from 05/24/2017 in [place]  Referring Provider  Dr. Aundra Dubin      Encounter Date: 06/04/2017  Check In: Session Check In - 06/04/17 1119      Check-In   Location  [facility]    Staff Present  Suzanne Boron, BS, EP, Exercise Physiologist;Debra Wynetta Emery, RN, BSN    Supervising physician immediately available to respond to emergencies  See telemetry face sheet for immediately available MD    Medication changes reported      No    Fall or balance concerns reported     No    Warm-up and Cool-down  Performed as group-led instruction    Resistance Training Performed  Yes    VAD Patient?  No      Pain Assessment   Currently in Pain?  No/denies    Pain Score  0-No pain    Multiple Pain Sites  No       Capillary Blood Glucose: No results found. However, due to the size of the patient record, not all encounters were searched. Please check Results Review for a complete set of results.    Social History   Tobacco Use  Smoking Status Never Smoker  Smokeless Tobacco Never Used    Goals Met:  Independence with exercise equipment Improved SOB with ADL's Using PLB without cueing & demonstrates good technique Exercise tolerated well No report of cardiac concerns or symptoms Strength training completed today  Goals Unmet:  Not Applicable  Comments: Check out 1145   Dr. Sinda Du is Medical Director for [facility] Pulmonary Rehab.

## 2017-06-05 ENCOUNTER — Encounter (HOSPITAL_COMMUNITY): Payer: Medicare Other

## 2017-06-06 ENCOUNTER — Encounter (HOSPITAL_COMMUNITY)
Admission: RE | Admit: 2017-06-06 | Discharge: 2017-06-06 | Disposition: A | Discharge status: Home / Self Care | Payer: Medicare Other | Source: Ambulatory Visit | Attending: Cardiology | Admitting: Cardiology

## 2017-06-06 DIAGNOSIS — I5032 Chronic diastolic (congestive) heart failure: Secondary | ICD-10-CM | POA: Diagnosis not present

## 2017-06-06 NOTE — Progress Notes (Signed)
Daily Session Note  Patient Details  Name: [name] [name] MRN: [id number] Date of Birth: July 05, 1951 Referring Provider:     PULMONARY REHAB OTHER RESP ORIENTATION from 05/24/2017 in [place]  Referring Provider  Dr. Aundra Dubin      Encounter Date: 06/06/2017  Check In: Session Check In - 06/06/17 1045      Check-In   Location  [facility]    Staff Present  Diane Angelina Pih, MS, EP, [facility], Exercise Physiologist;Uziah Sorter Wynetta Emery, RN, BSN;Gregory Cowan, BS, EP, Exercise Physiologist    Supervising physician immediately available to respond to emergencies  See telemetry face sheet for immediately available MD    Medication changes reported      No    Fall or balance concerns reported     No    Warm-up and Cool-down  Performed as group-led instruction    Resistance Training Performed  Yes    VAD Patient?  No      Pain Assessment   Currently in Pain?  No/denies    Pain Score  0-No pain    Multiple Pain Sites  No       Capillary Blood Glucose: No results found. However, due to the size of the patient record, not all encounters were searched. Please check Results Review for a complete set of results.  Exercise Prescription Changes - 06/06/17 0800      Response to Exercise   Blood Pressure (Admit)  124/72    Blood Pressure (Exercise)  102/52    Blood Pressure (Exit)  106/54    Heart Rate (Admit)  73 bpm    Heart Rate (Exercise)  81 bpm    Heart Rate (Exit)  77 bpm    Oxygen Saturation (Admit)  93 %    Oxygen Saturation (Exercise)  96 %    Oxygen Saturation (Exit)  98 %    Rating of Perceived Exertion (Exercise)  10    Perceived Dyspnea (Exercise)  10    Duration  Progress to 30 minutes of  aerobic without signs/symptoms of physical distress    Intensity  THRR New [phone]      Progression   Progression  Continue to progress workloads to maintain intensity without signs/symptoms of physical distress.      Resistance Training   Training  Prescription  Yes    Weight  1    Reps  10-15      Treadmill   MPH  1.3    Grade  0    Minutes  15    METs  1.9      NuStep   Level  2    SPM  67    Minutes  20    METs  1.5      Home Exercise Plan   Plans to continue exercise at  Home (comment)    Frequency  Add 2 additional days to program exercise sessions.    Initial Home Exercises Provided  05/28/17       Social History   Tobacco Use  Smoking Status Never Smoker  Smokeless Tobacco Never Used    Goals Met:  Proper associated with RPD/PD & O2 Sat Independence with exercise equipment Improved SOB with ADL's Using PLB without cueing & demonstrates good technique Exercise tolerated well No report of cardiac concerns or symptoms Strength training completed today  Goals Unmet:  Not Applicable  Comments: Check out 1145.   Dr. Sinda Du is Medical Director for [facility] Pulmonary Rehab.

## 2017-06-11 ENCOUNTER — Encounter (HOSPITAL_COMMUNITY): Payer: Medicare Other

## 2017-06-11 ENCOUNTER — Encounter (HOSPITAL_COMMUNITY)
Admission: RE | Admit: 2017-06-11 | Discharge: 2017-06-11 | Disposition: A | Discharge status: Home / Self Care | Payer: Medicare Other | Source: Ambulatory Visit | Attending: Cardiology | Admitting: Cardiology

## 2017-06-11 DIAGNOSIS — I5032 Chronic diastolic (congestive) heart failure: Secondary | ICD-10-CM | POA: Diagnosis not present

## 2017-06-13 ENCOUNTER — Encounter (HOSPITAL_COMMUNITY): Payer: Medicare Other

## 2017-06-18 ENCOUNTER — Encounter (HOSPITAL_COMMUNITY)
Admission: RE | Admit: 2017-06-18 | Discharge: 2017-06-18 | Disposition: A | Discharge status: Home / Self Care | Payer: Medicare Other | Source: Ambulatory Visit | Attending: Cardiology | Admitting: Cardiology

## 2017-06-18 ENCOUNTER — Telehealth (HOSPITAL_COMMUNITY): Payer: Self-pay

## 2017-06-18 DIAGNOSIS — I5032 Chronic diastolic (congestive) heart failure: Secondary | ICD-10-CM | POA: Diagnosis not present

## 2017-06-18 NOTE — Progress Notes (Signed)
Daily Session Note  Patient Details  Name: [name] [name] MRN: [id number] Date of Birth: 06/02/1951 Referring Provider:     PULMONARY REHAB OTHER RESP ORIENTATION from 05/24/2017 in [place] [facility]  Referring Provider  Dr. Mclean      Encounter Date: 06/18/2017  Check In: Session Check In - 06/18/17 1051      Check-In   Location  [facility]    Staff Present  Diane Coad, MS, EP, CHC, Exercise Physiologist;Astou Lada, BS, EP, Exercise Physiologist    Supervising physician immediately available to respond to emergencies  See telemetry face sheet for immediately available MD    Medication changes reported      No    Fall or balance concerns reported     No    Warm-up and Cool-down  Performed as group-led instruction    Resistance Training Performed  Yes    VAD Patient?  No      Pain Assessment   Currently in Pain?  No/denies    Pain Score  0-No pain    Multiple Pain Sites  No       Capillary Blood Glucose: No results found. However, due to the size of the patient record, not all encounters were searched. Please check Results Review for a complete set of results.    Social History   Tobacco Use  Smoking Status Never Smoker  Smokeless Tobacco Never Used    Goals Met:  Independence with exercise equipment Improved SOB with ADL's Using PLB without cueing & demonstrates good technique Exercise tolerated well No report of cardiac concerns or symptoms Strength training completed today  Goals Unmet:  Not Applicable  Comments: Check out 1145   Dr. Edward Hawkins is Medical Director for [facility]. 

## 2017-06-18 NOTE — Telephone Encounter (Signed)
Advanced Heart Failure Triage Encounter  Patient Name: [name] [name]  Date of Call: 06/18/17  Problem:  Pt called b/c her pinky was purple last night, and c/o being cold. Pt is unsure of circulation, or if she has hit hand.   Plan:  Pt was told to go to ED to have site evaluated as soon as possible.   Shirley Muscat, RN

## 2017-06-20 ENCOUNTER — Encounter (HOSPITAL_COMMUNITY)
Admission: RE | Admit: 2017-06-20 | Discharge: 2017-06-20 | Disposition: A | Discharge status: Home / Self Care | Payer: Medicare Other | Source: Ambulatory Visit | Attending: Cardiology | Admitting: Cardiology

## 2017-06-20 DIAGNOSIS — I5032 Chronic diastolic (congestive) heart failure: Secondary | ICD-10-CM | POA: Diagnosis not present

## 2017-06-20 NOTE — Progress Notes (Signed)
Daily Session Note  Patient Details  Name: [name] [name] MRN: [id number] Date of Birth: [birth year] Referring Provider:     PULMONARY REHAB OTHER RESP ORIENTATION from 05/24/2017 in [place]  Referring Provider  Dr. Aundra Dubin      Encounter Date: 06/20/2017  Check In: Session Check In - 06/20/17 1045      Check-In   Location  [facility]    Staff Present  Diane Angelina Pih, MS, EP, CHC, Exercise Physiologist;Benjermin Korber Luther Parody, BS, EP, Exercise Physiologist;Debra Wynetta Emery, RN, BSN    Supervising physician immediately available to respond to emergencies  See telemetry face sheet for immediately available MD    Medication changes reported      No    Fall or balance concerns reported     No    Warm-up and Cool-down  Performed as group-led instruction    Resistance Training Performed  Yes    VAD Patient?  No      Pain Assessment   Currently in Pain?  No/denies    Pain Score  0-No pain    Multiple Pain Sites  No       Capillary Blood Glucose: No results found. However, due to the size of the patient record, not all encounters were searched. Please check Results Review for a complete set of results.    Social History   Tobacco Use  Smoking Status Never Smoker  Smokeless Tobacco Never Used    Goals Met:  Independence with exercise equipment Improved SOB with ADL's Using PLB without cueing & demonstrates good technique Exercise tolerated well No report of cardiac concerns or symptoms Strength training completed today  Goals Unmet:  Not Applicable  Comments: Check out 1145   Dr. Sinda Du is Medical Director for [facility].

## 2017-06-21 NOTE — Progress Notes (Signed)
Pulmonary Individual Treatment Plan  Patient Details  Name: [name] [name] MRN: [id number] Date of Birth: 10/06/1950 Referring Provider:     PULMONARY REHAB OTHER RESP ORIENTATION from 05/24/2017 in [facility]  Referring Provider  Dr. Aundra Dubin      Initial Encounter Date:    [facility] from 05/24/2017 in [place]  Date  05/24/17  Referring Provider  Dr. Aundra Dubin      Visit Diagnosis: Diastolic CHF, chronic ([place])  Patient's Home Medications on Admission:   Current Outpatient Medications:  .  ALPRAZolam (XANAX) 0.5 MG tablet, Take 0.5 mg by mouth at bedtime. May take additional dose daily for anxiety, Disp: , Rfl:  .  amLODipine (NORVASC) 5 MG tablet, Take 0.5 tablets (2.5 mg total) by mouth daily., Disp: 45 tablet, Rfl: 3 .  APIDRA 100 UNIT/ML injection, Inject 46 Units into the skin as directed. INFUSE THROUGH INSULIN PUMP UTD::[place] 46.15 units daily, [place]us depends on carb intake, Disp: , Rfl: 2 .  aspirin EC 81 MG tablet, Take 81 mg by mouth at bedtime., Disp: , Rfl:  .  azithromycin (ZITHROMAX) 500 MG tablet, 1 tablet by mouth one hour prior to dental appointment/procedure (Patient not taking: Reported on 05/27/2017), Disp: 2 tablet, Rfl: 0 .  Calcium Carbonate-Vitamin D (RA CALCIUM PLUS VITAMIN D) 600-400 MG-UNIT per tablet, Take 1 tablet by mouth daily. , Disp: , Rfl:  .  Cholecalciferol 2000 UNITS TABS, Take 1 tablet by mouth every morning. , Disp: , Rfl:  .  clobetasol (OLUX) 0.05 % topical foam, Apply 1 application topically daily., Disp: , Rfl: 0 .  Coenzyme Q10 (CO Q 10) 100 MG CAPS, Take 1 capsule by mouth at bedtime. , Disp: , Rfl:  .  colchicine 0.6 MG tablet, Take 0.6 mg by mouth at bedtime. And,Take 1 tablet at onset and repeate in 1 hour and then 1 daily x 2 (FOR FLARES) , Disp: , Rfl:  .  docusate sodium (COLACE) 100 MG capsule, Take 100 mg by mouth at bedtime., Disp: , Rfl:  .  Fe Fum-FA-B  Cmp-C-Zn-Mg-Mn-Cu (HEMATINIC PLUS COMPLEX) 106-1 MG TABS, Take 1 tablet by mouth every Monday, Wednesday, and Friday. Take 1 tablet oraly three times a week or as directed daily with with a Vitamin C tablet, Disp: 90 each, Rfl: 0 .  glucagon (GLUCAGON EMERGENCY) 1 MG injection, Inject 1 mg into the vein once as needed (for severe reaction). Reported on 12/05/2015, Disp: , Rfl:  .  isosorbide mononitrate (IMDUR) 120 MG 24 hr tablet, Take 120 mg by mouth daily., Disp: , Rfl:  .  latanoprost (XALATAN) 0.005 % ophthalmic solution, Place 1 drop into both eyes at bedtime., Disp: , Rfl:  .  Magnesium Oxide 250 MG TABS, Take 250 mg by mouth at bedtime. , Disp: , Rfl:  .  metolazone (ZAROXOLYN) 2.5 MG tablet, Take 2.5 mg (1 tab) once as directed by CHF clinic. Call before taking [phone], Disp: 3 tablet, Rfl: 0 .  metoprolol tartrate (LOPRESSOR) 50 MG tablet, TAKE 1 AND 1/2 TABLETS(75 MG) BY MOUTH TWICE DAILY, Disp: 270 tablet, Rfl: 0 .  Multiple Vitamins-Minerals (MULTIVITAMIN WITH MINERALS) tablet, Take 1 tablet by mouth every morning. , Disp: , Rfl:  .  nitroGLYCERIN (NITROLINGUAL) 0.4 MG/SPRAY spray, Place 1 spray under the tongue every 5 (five) minutes as needed for chest pain., Disp: 12 g, Rfl: 1 .  pantoprazole (PROTONIX) 40 MG tablet, Take 1 tablet (40 mg total) by  mouth daily., Disp: 30 tablet, Rfl: 11 .  Polyethyl Glycol-Propyl Glycol (SYSTANE) 0.4-0.3 % SOLN, Apply 1 drop to eye daily as needed (dry eyes)., Disp: , Rfl:  .  potassium chloride (MICRO-K) 10 MEQ CR capsule, Take 4 capsules (40 mEq total) by mouth 2 (two) times daily., Disp: 240 capsule, Rfl: 3 .  PRALUENT 150 MG/ML SOPN, INJECT 1 PEN INTO THE SKIN EVERY 14 (FOURTEEN) DAYS., Disp: 2 pen, Rfl: 3 .  pravastatin (PRAVACHOL) 10 MG tablet, TAKE 1 TABLET BY MOUTH ONCE DAILY, Disp: 90 tablet, Rfl: 3 .  spironolactone (ALDACTONE) 25 MG tablet, Take 0.5 tablets (12.5 mg total) by mouth daily., Disp: 15 tablet, Rfl: 3 .  ticagrelor (BRILINTA)  60 MG TABS tablet, TAKE 1 TABLET(60 MG) BY MOUTH TWICE DAILY, Disp: 180 tablet, Rfl: 2 .  torsemide (DEMADEX) 20 MG tablet, Take 80 mg (4 tabs) in AM, then 60 mg (3 tabs) in PM, Disp: 210 tablet, Rfl: 6 .  triamcinolone cream (KENALOG) 0.5 %, Apply 1 application topically daily as needed (inflamation). , Disp: , Rfl:  .  ULORIC 40 MG tablet, Take 40 mg by mouth at bedtime. , Disp: , Rfl: 11 .  vitamin C (ASCORBIC ACID) 500 MG tablet, Take 500 mg by mouth 3 (three) times a week., Disp: , Rfl:   Past Medical History: Past Medical History:  Diagnosis Date  . Anxiety   . Aortic stenosis    a. bicuspid aortic valve; mean gradient of 20 mmHg in 2/10; b. s/p bioprosth AVR (19-mm Edwards pericardial valve) with a redo coronary artery bypass graft procedure in 07/2009; postoperative Dressler's syndrome    . Arthritis   . Carcinoma, renal cell ([place]) 05/2007   Laparoscopic right nephrectomy  . Chronic diastolic CHF (congestive heart failure) ([place])    a. 9.2014 EchoP EF 60-65%, no rwma, bioprosth AVR, mean gradient of 67mHg, mild to mod MR, PASP 526mg.  . CKD (chronic kidney disease), stage II   . Colitis, ischemic ([facility]  . Coronary artery disease    a. initial CABG with SVG-RCA in 12/05. b. redo SVG-RCA and bioprosthetic AVR in 1/11. d. 9/14 - PCI with DES to distal LM/proximal LAD was done rather than bypass as she would have been a poor candidate for redo sternotomy. d. S/p DES to prox LM 05/2014.  . Diabetes mellitus 03/2010    TYPE II: Hemoglobin A1c of 7.4 in  03/2010; 8.4 in 06/2010; treated with insulin pump  . Duodenal ulcer    Remote; H. pylori positive  . Gastroparesis due to DM ([facility]10/04/2012  . Genetic testing 01/14/2017   Ms. Juhnke underwent genetic counseling and testing for hereditary cancer syndromes on 01/01/2017. Her results were negative for mutations in all 83 genes analyzed by [facility]'s 83-gene Common Hereditary Cancers Panel. Genes analyzed include: ALK, APC, ATM, AXIN2,  BAP1, BARD1, BLM, BMPR1A, BRCA1, BRCA2, BRIP1, CASR, CDC73, CDH1, CDK4, CDKN1B, CDKN1C, CDKN2A, CEBPA, CHEK2, CTNNA1, DICER1, DIS3L2,   . Hodgkin's disease ([facility]   Mantle radiation therapy  . Hyperlipidemia   . Hyperplastic gastric polyp 06/23/2012  . Hypertension   . Iron deficiency anemia    a. 03/2013 EGD: essentially normal. Possible slow GIB.  . Migraines   . NASH (nonalcoholic steatohepatitis) 1999   -biopsy in 1999  . OSA on CPAP 04/24/2017   Moderate with AHI 22/hr now on CPAP at 18cm H2o  . Osteopenia     hip on DEXA in October 2007.  . Osteoporosis   .  PAF (paroxysmal atrial fibrillation) ([place])    a.  h/o brief episodes noted on ppm interrogation. b. given GI bleeding and need to be on both ASA 81 and Brilinta, risks likely outweigh benefits of anticoagulation.   . Small bowel obstruction ([facility])    Recurrent; resolved after resection of a lipoma  . Syncope    a. H/o recurrent syncope with pauses on loop recorder s/p Medtronic pacemaker 07/2012.    Tobacco Use: Social History   Tobacco Use  Smoking Status Never Smoker  Smokeless Tobacco Never Used    Labs: Recent Review Flowsheet Data    Labs for ITP Cardiac and Pulmonary Rehab Latest Ref Rng & Units 11/04/2015 04/27/2016 04/19/2017 04/19/2017 05/15/2017   Cholestrol 0 - 200 mg/dL 115(L) 125 - - 80   LDLCALC 0 - 99 mg/dL 33 42 - - 16   LDLDIRECT mg/dL - - - - -   HDL >40 mg/dL 41(L) 43(L) - - 40(L)   Trlycerides <150 mg/dL 207(H) 199(H) - - 119   Hemoglobin A1c 4.8 - 5.6 % - - - - -   PHART 7.350 - 7.450 - - - - -   PCO2ART 35.0 - 45.0 mmHg - - - - -   HCO3 20.0 - 28.0 mmol/L - - 28.1(H) 27.3 -   TCO2 22 - 32 mmol/L - - 29 29 -   ACIDBASEDEF 0.0 - 2.0 mmol/L - - - - -   O2SAT % - - 78.0 75.0 -      Capillary Blood Glucose: Lab Results  Component Value Date   GLUCAP 198 (H) 04/19/2017   GLUCAP 144 (H) 04/19/2017   GLUCAP 152 (H) 04/19/2017   GLUCAP 164 (H) 04/19/2017   GLUCAP 170 (H) 04/19/2017      Pulmonary Assessment Scores:   Pulmonary Function Assessment:   Exercise Target Goals:    Exercise Program Goal: Individual exercise prescription set with THRR, safety & activity barriers. Participant demonstrates ability to understand and report RPE using BORG scale, to self-measure pulse accurately, and to acknowledge the importance of the exercise prescription.  Exercise Prescription Goal: Starting with aerobic activity 30 plus minutes a day, 3 days per week for initial exercise prescription. Provide home exercise prescription and guidelines that participant acknowledges understanding prior to discharge.  Activity Barriers & Risk Stratification: Activity Barriers & Cardiac Risk Stratification - 05/24/17 1334      Activity Barriers & Cardiac Risk Stratification   Activity Barriers  Deconditioning;Muscular Weakness;Shortness of Breath;Balance Concerns;History of Falls    Cardiac Risk Stratification  High       6 Minute Walk: 6 Minute Walk    Row Name 05/24/17 1333         6 Minute Walk   Phase  Initial     Distance  1050 feet     Distance % Change  0 %     Distance Feet Change  0 ft     Walk Time  6 minutes     # of Rest Breaks  0     MPH  1.98     METS  2.52     RPE  15     Perceived Dyspnea   13     VO2 Peak  9.79     Symptoms  No     Resting HR  71 bpm     Resting BP  102/52     Resting Oxygen Saturation   96 %     Exercise Oxygen Saturation  during 6 min walk  95 %     Max Ex. HR  77 bpm     Max Ex. BP  120/60     2 Minute Post BP  100/56        Oxygen Initial Assessment: Oxygen Initial Assessment - 05/24/17 1504      Home Oxygen   Home Oxygen Device  None    Sleep Oxygen Prescription  None    Home Exercise Oxygen Prescription  None    Home at Rest Exercise Oxygen Prescription  None      Initial 6 min Walk   Oxygen Used  None      Program Oxygen Prescription   Program Oxygen Prescription  None       Oxygen Re-Evaluation: Oxygen  Re-Evaluation    Row Name 06/20/17 1611             Program Oxygen Prescription   Program Oxygen Prescription  None         Home Oxygen   Home Oxygen Device  None       Sleep Oxygen Prescription  None       Home Exercise Oxygen Prescription  None       Home at Rest Exercise Oxygen Prescription  None         Goals/Expected Outcomes   Short Term Goals  To learn and understand importance of monitoring SPO2 with pulse oximeter and demonstrate accurate use of the pulse oximeter.;To learn and understand importance of maintaining oxygen saturations>88%;To learn and demonstrate proper pursed lip breathing techniques or other breathing techniques.       Long  Term Goals  Verbalizes importance of monitoring SPO2 with pulse oximeter and return demonstration;Maintenance of O2 saturations>88%;Exhibits proper breathing techniques, such as pursed lip breathing or other method taught during program session;Compliance with respiratory medication       Comments  Patient demonstrates proper use of pulse oximeter and pursed lip breathing during exercise and verbalizes understanding of maintaining her O2 sat >88%.        Goals/Expected Outcomes  Patient will continue to meet short and long term goals.           Oxygen Discharge (Final Oxygen Re-Evaluation): Oxygen Re-Evaluation - 06/20/17 1611      Program Oxygen Prescription   Program Oxygen Prescription  None      Home Oxygen   Home Oxygen Device  None    Sleep Oxygen Prescription  None    Home Exercise Oxygen Prescription  None    Home at Rest Exercise Oxygen Prescription  None      Goals/Expected Outcomes   Short Term Goals  To learn and understand importance of monitoring SPO2 with pulse oximeter and demonstrate accurate use of the pulse oximeter.;To learn and understand importance of maintaining oxygen saturations>88%;To learn and demonstrate proper pursed lip breathing techniques or other breathing techniques.    Long  Term Goals   Verbalizes importance of monitoring SPO2 with pulse oximeter and return demonstration;Maintenance of O2 saturations>88%;Exhibits proper breathing techniques, such as pursed lip breathing or other method taught during program session;Compliance with respiratory medication    Comments  Patient demonstrates proper use of pulse oximeter and pursed lip breathing during exercise and verbalizes understanding of maintaining her O2 sat >88%.     Goals/Expected Outcomes  Patient will continue to meet short and long term goals.        Initial Exercise Prescription: Initial Exercise Prescription - 05/24/17 1400  Date of Initial Exercise RX and Referring Provider   Date  05/24/17    Referring Provider  Dr. Aundra Dubin      Treadmill   MPH  1.1    Grade  0    Minutes  15    METs  1.8      NuStep   Level  2    SPM  77    Minutes  20    METs  1.6      Prescription Details   Frequency (times per week)  3    Duration  Progress to 30 minutes of continuous aerobic without signs/symptoms of physical distress      Intensity   THRR 40-80% of Max Heartrate  105-122-139    Ratings of Perceived Exertion  11-13    Perceived Dyspnea  0-4      Progression   Progression  Continue progressive overload as per policy without signs/symptoms or physical distress.      Resistance Training   Training Prescription  Yes    Weight  1    Reps  10-15       Perform Capillary Blood Glucose checks as needed.  Exercise Prescription Changes:  Exercise Prescription Changes    Row Name 05/29/17 0800 06/06/17 0800 06/21/17 0700         Response to Exercise   Blood Pressure (Admit)  -  124/72  114/52     Blood Pressure (Exercise)  -  102/52  122/50     Blood Pressure (Exit)  -  106/54  100/50     Heart Rate (Admit)  -  73 bpm  70 bpm     Heart Rate (Exercise)  -  81 bpm  100 bpm     Heart Rate (Exit)  -  77 bpm  72 bpm     Oxygen Saturation (Admit)  -  93 %  98 %     Oxygen Saturation (Exercise)  -  96 %   93 %     Oxygen Saturation (Exit)  -  98 %  99 %     Rating of Perceived Exertion (Exercise)  -  10  11     Perceived Dyspnea (Exercise)  -  10  11     Duration  -  Progress to 30 minutes of  aerobic without signs/symptoms of physical distress  Progress to 30 minutes of  aerobic without signs/symptoms of physical distress     Intensity  -  THRR New 106-123-139  THRR New 104-122-139       Progression   Progression  -  Continue to progress workloads to maintain intensity without signs/symptoms of physical distress.  Continue to progress workloads to maintain intensity without signs/symptoms of physical distress.       Resistance Training   Training Prescription  Yes  Yes  Yes     Weight  _0 Reps  10-15  10-15  10-15       Treadmill   MPH  1.1  1.3  1.4     Grade  0  0  0     Minutes  _1 METs  1.8  1.9  2       NuStep   Level  _2 SPM  77  67  107     Minutes  20  20  20  METs  1.6  1.5  1.7       Home Exercise Plan   Plans to continue exercise at  Home (comment)  Home (comment)  Home (comment)     Frequency  Add 2 additional days to program exercise sessions.  Add 2 additional days to program exercise sessions.  Add 2 additional days to program exercise sessions.     Initial Home Exercises Provided  05/28/17  05/28/17  05/28/17        Exercise Comments:  Exercise Comments    Row Name 05/29/17 9562 06/21/17 0741         Exercise Comments  Patient received the take home exercise plan today 05/28/2017. THR was addressed as were safe ways to be active outside of Rehab. Patient demonstrated an understanding. Patient was encouraged to ask any future questions.   Patient is doing well in PR. She states that she feels stronger and has gained more endurance she has started. She has increased in SPMs on the Nustep and has increased her speed on the treadmill as well.         Exercise Goals and Review:  Exercise Goals    Row Name 05/24/17 1335              Exercise Goals   Increase Physical Activity  Yes       Intervention  Provide advice, education, support and counseling about physical activity/exercise needs.;Develop an individualized exercise prescription for aerobic and resistive training based on initial evaluation findings, risk stratification, comorbidities and participant's personal goals.       Expected Outcomes  Achievement of increased cardiorespiratory fitness and enhanced flexibility, muscular endurance and strength shown through measurements of functional capacity and personal statement of participant.       Increase Strength and Stamina  Yes       Intervention  Provide advice, education, support and counseling about physical activity/exercise needs.;Develop an individualized exercise prescription for aerobic and resistive training based on initial evaluation findings, risk stratification, comorbidities and participant's personal goals.       Expected Outcomes  Achievement of increased cardiorespiratory fitness and enhanced flexibility, muscular endurance and strength shown through measurements of functional capacity and personal statement of participant.       Able to understand and use rate of perceived exertion (RPE) scale  Yes       Intervention  Provide education and explanation on how to use RPE scale       Expected Outcomes  Short Term: Able to use RPE daily in rehab to express subjective intensity level;Long Term:  Able to use RPE to guide intensity level when exercising independently       Able to understand and use Dyspnea scale  Yes       Intervention  Provide education and explanation on how to use Dyspnea scale       Expected Outcomes  Short Term: Able to use Dyspnea scale daily in rehab to express subjective sense of shortness of breath during exertion;Long Term: Able to use Dyspnea scale to guide intensity level when exercising independently       Knowledge and understanding of Target Heart Rate Range (THRR)  Yes        Intervention  Provide education and explanation of THRR including how the numbers were predicted and where they are located for reference       Expected Outcomes  Short Term: Able to state/look up THRR       Able to check  pulse independently  Yes       Intervention  Provide education and demonstration on how to check pulse in carotid and radial arteries.;Review the importance of being able to check your own pulse for safety during independent exercise       Expected Outcomes  Short Term: Able to explain why pulse checking is important during independent exercise;Long Term: Able to check pulse independently and accurately       Understanding of Exercise Prescription  Yes       Intervention  Provide education, explanation, and written materials on patient's individual exercise prescription       Expected Outcomes  Short Term: Able to explain program exercise prescription;Long Term: Able to explain home exercise prescription to exercise independently          Exercise Goals Re-Evaluation : Exercise Goals Re-Evaluation    Row Name 06/21/17 0740             Exercise Goal Re-Evaluation   Exercise Goals Review  Increase Physical Activity;Increase Strength and Stamina;Knowledge and understanding of Target Heart Rate Range (THRR)       Comments  Patient is doing well in PR. She states that she feels stronger and has gained more endurance she has started. She has increased in SPMs on the Nustep and has increased her speed on the treadmill as well.        Expected Outcomes  Patient wishes to gain strength and stamina and to increase breathing           Discharge Exercise Prescription (Final Exercise Prescription Changes): Exercise Prescription Changes - 06/21/17 0700      Response to Exercise   Blood Pressure (Admit)  114/52    Blood Pressure (Exercise)  122/50    Blood Pressure (Exit)  100/50    Heart Rate (Admit)  70 bpm    Heart Rate (Exercise)  100 bpm    Heart Rate (Exit)  72 bpm     Oxygen Saturation (Admit)  98 %    Oxygen Saturation (Exercise)  93 %    Oxygen Saturation (Exit)  99 %    Rating of Perceived Exertion (Exercise)  11    Perceived Dyspnea (Exercise)  11    Duration  Progress to 30 minutes of  aerobic without signs/symptoms of physical distress    Intensity  THRR New 104-122-139      Progression   Progression  Continue to progress workloads to maintain intensity without signs/symptoms of physical distress.      Resistance Training   Training Prescription  Yes    Weight  1    Reps  10-15      Treadmill   MPH  1.4    Grade  0    Minutes  15    METs  2      NuStep   Level  2    SPM  107    Minutes  20    METs  1.7      Home Exercise Plan   Plans to continue exercise at  Home (comment)    Frequency  Add 2 additional days to program exercise sessions.    Initial Home Exercises Provided  05/28/17       Nutrition:  Target Goals: Understanding of nutrition guidelines, daily intake of sodium <1573m, cholesterol <2066m calories 30% from fat and 7% or less from saturated fats, daily to have 5 or more servings of fruits and vegetables.  Biometrics: Pre Biometrics - 05/24/17  1425      Pre Biometrics   Height  5' 3" (1.6 m)    Weight  114 lb 10.2 oz (52 kg)    Waist Circumference  30.5 inches    Hip Circumference  33.5 inches    Waist to Hip Ratio  0.91 %    BMI (Calculated)  20.31    Triceps Skinfold  8 mm    % Body Fat  27 %    Grip Strength  41.4 kg    Flexibility  10.33 in    Single Leg Stand  10 seconds        Nutrition Therapy Plan and Nutrition Goals: Nutrition Therapy & Goals - 06/20/17 1613      Nutrition Therapy   RD appointment defered  Yes       Nutrition Discharge: Rate Your Plate Scores: Nutrition Assessments - 05/24/17 1506      MEDFICTS Scores   Pre Score  9       Nutrition Goals Re-Evaluation:   Nutrition Goals Discharge (Final Nutrition Goals Re-Evaluation):   Psychosocial: Target Goals:  Acknowledge presence or absence of significant depression and/or stress, maximize coping skills, provide positive support system. Participant is able to verbalize types and ability to use techniques and skills needed for reducing stress and depression.  Initial Review & Psychosocial Screening: Initial Psych Review & Screening - 05/24/17 1509      Initial Review   Current issues with  None Identified      Family Dynamics   Good Support System?  Yes      Barriers   Psychosocial barriers to participate in program  There are no identifiable barriers or psychosocial needs.      Screening Interventions   Interventions  Encouraged to exercise       Quality of Life Scores: Quality of Life - 05/24/17 1335      Quality of Life Scores   Health/Function Pre  13.17 %    Socioeconomic Pre  26.57 %    Psych/Spiritual Pre  22.93 %    Family Pre  30 %    GLOBAL Pre  20.41 %       PHQ-9: Recent Review Flowsheet Data    Depression screen [facility] 2/9 05/24/2017   Decreased Interest 0   Down, Depressed, Hopeless 0   PHQ - 2 Score 0   Altered sleeping 1   Tired, decreased energy 2   Change in appetite 1   Feeling bad or failure about yourself  0   Trouble concentrating 0   Moving slowly or fidgety/restless 0   Suicidal thoughts 0   PHQ-9 Score 4   Difficult doing work/chores Not difficult at all     Interpretation of Total Score  Total Score Depression Severity:  1-4 = Minimal depression, 5-9 = Mild depression, 10-14 = Moderate depression, 15-19 = Moderately severe depression, 20-27 = Severe depression   Psychosocial Evaluation and Intervention: Psychosocial Evaluation - 05/24/17 1509      Psychosocial Evaluation & Interventions   Interventions  Encouraged to exercise with the program and follow exercise prescription    Continue Psychosocial Services   No Follow up required       Psychosocial Re-Evaluation: Psychosocial Re-Evaluation    [place] Name 06/20/17 1618              Psychosocial Re-Evaluation   Current issues with  None Identified       Comments  Patient's initial QOL score was 20.41 and her  PHQ-9 score was 4.       Expected Outcomes  Patient will have no psychosocial barriers identified at discharge.        Interventions  Stress management education;Encouraged to attend Pulmonary Rehabilitation for the exercise;Relaxation education       Continue Psychosocial Services   No Follow up required          Psychosocial Discharge (Final Psychosocial Re-Evaluation): Psychosocial Re-Evaluation - 06/20/17 1618      Psychosocial Re-Evaluation   Current issues with  None Identified    Comments  Patient's initial QOL score was 20.41 and her PHQ-9 score was 4.    Expected Outcomes  Patient will have no psychosocial barriers identified at discharge.     Interventions  Stress management education;Encouraged to attend Pulmonary Rehabilitation for the exercise;Relaxation education    Continue Psychosocial Services   No Follow up required        Education: Education Goals: Education classes will be provided on a weekly basis, covering required topics. Participant will state understanding/return demonstration of topics presented.  Learning Barriers/Preferences: Learning Barriers/Preferences - 05/24/17 1457      Learning Barriers/Preferences   Learning Barriers  None    Learning Preferences  Video;Written Material;Pictoral;Computer/Internet       Education Topics: How Lungs Work and Diseases: - Discuss the anatomy of the lungs and diseases that can affect the lungs, such as COPD.   Exercise: -Discuss the importance of exercise, FITT principles of exercise, normal and abnormal responses to exercise, and how to exercise safely.   PULMONARY REHAB OTHER RESPIRATORY from 06/06/2017 in [place]  Date  05/30/17  Educator  DJ  Instruction Review Code  2- Demonstrated Understanding      Environmental Irritants: -Discuss types of  environmental irritants and how to limit exposure to environmental irritants.   Meds/Inhalers and oxygen: - Discuss respiratory medications, definition of an inhaler and oxygen, and the proper way to use an inhaler and oxygen.   Energy Saving Techniques: - Discuss methods to conserve energy and decrease shortness of breath when performing activities of daily living.    Bronchial Hygiene / Breathing Techniques: - Discuss breathing mechanics, pursed-lip breathing technique,  proper posture, effective ways to clear airways, and other functional breathing techniques   Cleaning Equipment: - Provides group verbal and written instruction about the health risks of elevated stress, cause of high stress, and healthy ways to reduce stress.   Nutrition I: Fats: - Discuss the types of cholesterol, what cholesterol does to the body, and how cholesterol levels can be controlled.   Nutrition II: Labels: -Discuss the different components of food labels and how to read food labels.   Respiratory Infections: - Discuss the signs and symptoms of respiratory infections, ways to prevent respiratory infections, and the importance of seeking medical treatment when having a respiratory infection.   Stress I: Signs and Symptoms: - Discuss the causes of stress, how stress may lead to anxiety and depression, and ways to limit stress.   Stress II: Relaxation: -Discuss relaxation techniques to limit stress.   Oxygen for Home/Travel: - Discuss how to prepare for travel when on oxygen and proper ways to transport and store oxygen to ensure safety.   Knowledge Questionnaire Score: Knowledge Questionnaire Score - 05/24/17 1500      Knowledge Questionnaire Score   Pre Score  23/24       Core Components/Risk Factors/Patient Goals at Admission: Personal Goals and Risk Factors at Admission - 05/24/17 1507  Core Components/Risk Factors/Patient Goals on Admission    Weight Management  Weight  Maintenance    Improve shortness of breath with ADL's  Yes    Intervention  Provide education, individualized exercise plan and daily activity instruction to help decrease symptoms of SOB with activities of daily living.    Expected Outcomes  Short Term: Achieves a reduction of symptoms when performing activities of daily living.    Personal Goal Other  Yes    Personal Goal  Increase strength and stamina, improve breathing and decrease chest discomfort with exertion. Be able to start traveling in their RV again.     Intervention  Attend CR 2 x week and supplement with at home exercise 3 x week.     Expected Outcomes  Reach personal goals.        Core Components/Risk Factors/Patient Goals Review:  Goals and Risk Factor Review    Row Name 06/20/17 1613             Core Components/Risk Factors/Patient Goals Review   Personal Goals Review  Weight Management/Obesity;Improve shortness of breath with ADL's;Develop more efficient breathing techniques such as purse lipped breathing and diaphragmatic breathing and practicing self-pacing with activity.;Heart Failure Gain weight. Get stronger; be able to travel in RV again and visit family.       Review  Patient has completed 8 sessions gaining 1 lb. She says she feels the program is helping her. She says she still gets SOB but she feels her endurance and strength have improved. She her husband also has noticed an improvement. She says she is able to do more around the house without getting fatigued. She is able to climb stairs without having to stop and rest before she reaches the top. She has not traveled in her RV yet but hopes to in the Spring. Will continue to monitor for progress.        Expected Outcomes  Patient will continue to attend sessiona and complete the program and continue to meet her personal goals.           Core Components/Risk Factors/Patient Goals at Discharge (Final Review):  Goals and Risk Factor Review - 06/20/17 1613       Core Components/Risk Factors/Patient Goals Review   Personal Goals Review  Weight Management/Obesity;Improve shortness of breath with ADL's;Develop more efficient breathing techniques such as purse lipped breathing and diaphragmatic breathing and practicing self-pacing with activity.;Heart Failure Gain weight. Get stronger; be able to travel in RV again and visit family.    Review  Patient has completed 8 sessions gaining 1 lb. She says she feels the program is helping her. She says she still gets SOB but she feels her endurance and strength have improved. She her husband also has noticed an improvement. She says she is able to do more around the house without getting fatigued. She is able to climb stairs without having to stop and rest before she reaches the top. She has not traveled in her RV yet but hopes to in the Spring. Will continue to monitor for progress.     Expected Outcomes  Patient will continue to attend sessiona and complete the program and continue to meet her personal goals.        ITP Comments: ITP Comments    Row Name 05/24/17 1502           ITP Comments  [name] [name] has been in the program before as a heart patient. She is a brittle diabetic with  a insulin pump. She gets dizzy if overly exerted. We will gradually increase her exercise work loads.           Comments: ITP 30 Day REVIEW Pt is making expected progress toward pulmonary rehab goals after completing 8 sessions. Recommend continued exercise, life style modification, education, and utilization of breathing techniques to increase stamina and strength and decrease shortness of breath with exertion.

## 2017-06-25 ENCOUNTER — Encounter (HOSPITAL_COMMUNITY)
Admission: RE | Admit: 2017-06-25 | Discharge: 2017-06-25 | Disposition: A | Discharge status: Home / Self Care | Payer: Medicare Other | Source: Ambulatory Visit | Attending: Cardiology | Admitting: Cardiology

## 2017-06-25 DIAGNOSIS — I5032 Chronic diastolic (congestive) heart failure: Secondary | ICD-10-CM

## 2017-06-25 NOTE — Progress Notes (Signed)
Daily Session Note  Patient Details  Name: [name] [name] MRN: [id number] Date of Birth: 12-10-1950 Referring Provider:     PULMONARY REHAB OTHER RESP ORIENTATION from 05/24/2017 in [place]  Referring Provider  Dr. Aundra Dubin      Encounter Date: 06/25/2017  Check In: Session Check In - 06/25/17 1053      Check-In   Location  [facility]    Staff Present  Diane Angelina Pih, MS, EP, [facility], Exercise Physiologist;Kristi Hyer Luther Parody, BS, EP, Exercise Physiologist    Supervising physician immediately available to respond to emergencies  See telemetry face sheet for immediately available MD    Medication changes reported      No    Fall or balance concerns reported     No    Warm-up and Cool-down  Performed as group-led instruction    Resistance Training Performed  Yes    VAD Patient?  No      Pain Assessment   Currently in Pain?  No/denies    Pain Score  0-No pain    Multiple Pain Sites  No       Capillary Blood Glucose: No results found. However, due to the size of the patient record, not all encounters were searched. Please check Results Review for a complete set of results.    Social History   Tobacco Use  Smoking Status Never Smoker  Smokeless Tobacco Never Used    Goals Met:  Independence with exercise equipment Improved SOB with ADL's Using PLB without cueing & demonstrates good technique Exercise tolerated well No report of cardiac concerns or symptoms Strength training completed today  Goals Unmet:  Not Applicable  Comments: Check out 1145   Dr. Sinda Du is Medical Director for [facility].

## 2017-06-27 ENCOUNTER — Encounter (HOSPITAL_COMMUNITY)
Admission: RE | Admit: 2017-06-27 | Discharge: 2017-06-27 | Disposition: A | Discharge status: Home / Self Care | Payer: Medicare Other | Source: Ambulatory Visit | Attending: Cardiology | Admitting: Cardiology

## 2017-06-27 DIAGNOSIS — I5032 Chronic diastolic (congestive) heart failure: Secondary | ICD-10-CM

## 2017-06-27 NOTE — Progress Notes (Signed)
Daily Session Note  Patient Details  Name: FUMIE FIALLO MRN: [id number] Date of Birth: 01-27-1951 Referring Provider:     PULMONARY REHAB OTHER RESP ORIENTATION from 05/24/2017 in [place]  Referring Provider  Dr. Aundra Dubin      Encounter Date: 06/27/2017  Check In: Session Check In - 06/27/17 1045      Check-In   Location  [facility]    Staff Present  Suzanne Boron, BS, EP, Exercise Physiologist;Debra Wynetta Emery, RN, BSN    Supervising physician immediately available to respond to emergencies  See telemetry face sheet for immediately available MD    Medication changes reported      No    Fall or balance concerns reported     No    Warm-up and Cool-down  Performed as group-led instruction    Resistance Training Performed  Yes    VAD Patient?  No      Pain Assessment   Currently in Pain?  No/denies    Pain Score  0-No pain    Multiple Pain Sites  No       Capillary Blood Glucose: No results found. However, due to the size of the patient record, not all encounters were searched. Please check Results Review for a complete set of results.    Social History   Tobacco Use  Smoking Status Never Smoker  Smokeless Tobacco Never Used    Goals Met:  Independence with exercise equipment Improved SOB with ADL's Using PLB without cueing & demonstrates good technique Exercise tolerated well No report of cardiac concerns or symptoms Strength training completed today  Goals Unmet:  Not Applicable  Comments: Check out 1145   Dr. Sinda Du is Medical Director for [facility].

## 2017-07-02 ENCOUNTER — Encounter (HOSPITAL_COMMUNITY): Payer: Medicare Other

## 2017-07-04 ENCOUNTER — Encounter (HOSPITAL_COMMUNITY)
Admission: RE | Admit: 2017-07-04 | Discharge: 2017-07-04 | Disposition: A | Discharge status: Home / Self Care | Payer: Medicare Other | Source: Ambulatory Visit | Attending: Cardiology | Admitting: Cardiology

## 2017-07-04 DIAGNOSIS — I5032 Chronic diastolic (congestive) heart failure: Secondary | ICD-10-CM

## 2017-07-04 NOTE — Progress Notes (Signed)
Daily Session Note  Patient Details  Name: SKY PRIMO MRN: [id number] Date of Birth: 11-11-1950 Referring Provider:     PULMONARY REHAB OTHER RESP ORIENTATION from 05/24/2017 in [place]  Referring Provider  Dr. Aundra Dubin      Encounter Date: 07/04/2017  Check In: Session Check In - 07/04/17 1045      Check-In   Location  [facility]    Staff Present  Yazen Rosko Angelina Pih, MS, EP, [facility], Exercise Physiologist;Debra Wynetta Emery, RN, BSN    Supervising physician immediately available to respond to emergencies  See telemetry face sheet for immediately available MD    Medication changes reported      No    Fall or balance concerns reported     No    Tobacco Cessation  No Change    Warm-up and Cool-down  Performed as group-led instruction    Resistance Training Performed  Yes    VAD Patient?  No      Pain Assessment   Currently in Pain?  No/denies    Pain Score  0-No pain    Multiple Pain Sites  No       Capillary Blood Glucose: No results found. However, due to the size of the patient record, not all encounters were searched. Please check Results Review for a complete set of results.    Social History   Tobacco Use  Smoking Status Never Smoker  Smokeless Tobacco Never Used    Goals Met:  Proper associated with RPD/PD & O2 Sat Improved SOB with ADL's Exercise tolerated well No report of cardiac concerns or symptoms Strength training completed today  Goals Unmet:  Not Applicable  Comments: Check out: [place]   Dr. Sinda Du is Medical Director for [facility].

## 2017-07-08 ENCOUNTER — Other Ambulatory Visit: Payer: Self-pay | Admitting: Internal Medicine

## 2017-07-09 ENCOUNTER — Encounter (HOSPITAL_COMMUNITY): Payer: Medicare Other

## 2017-07-10 ENCOUNTER — Encounter (HOSPITAL_COMMUNITY): Payer: Self-pay | Admitting: Cardiology

## 2017-07-10 ENCOUNTER — Ambulatory Visit (HOSPITAL_COMMUNITY)
Admission: RE | Admit: 2017-07-10 | Discharge: 2017-07-10 | Disposition: A | Discharge status: Home / Self Care | Payer: Medicare Other | Source: Ambulatory Visit | Attending: Cardiology | Admitting: Cardiology

## 2017-07-10 VITALS — BP 118/72 | HR 71 | Wt 115.8 lb

## 2017-07-10 DIAGNOSIS — E785 Hyperlipidemia, unspecified: Secondary | ICD-10-CM | POA: Insufficient documentation

## 2017-07-10 DIAGNOSIS — E1043 Type 1 diabetes mellitus with diabetic autonomic (poly)neuropathy: Secondary | ICD-10-CM | POA: Insufficient documentation

## 2017-07-10 DIAGNOSIS — N183 Chronic kidney disease, stage 3 (moderate): Secondary | ICD-10-CM | POA: Diagnosis not present

## 2017-07-10 DIAGNOSIS — Z905 Acquired absence of kidney: Secondary | ICD-10-CM | POA: Insufficient documentation

## 2017-07-10 DIAGNOSIS — R002 Palpitations: Secondary | ICD-10-CM | POA: Insufficient documentation

## 2017-07-10 DIAGNOSIS — I130 Hypertensive heart and chronic kidney disease with heart failure and stage 1 through stage 4 chronic kidney disease, or unspecified chronic kidney disease: Secondary | ICD-10-CM | POA: Insufficient documentation

## 2017-07-10 DIAGNOSIS — Z950 Presence of cardiac pacemaker: Secondary | ICD-10-CM | POA: Diagnosis not present

## 2017-07-10 DIAGNOSIS — G4733 Obstructive sleep apnea (adult) (pediatric): Secondary | ICD-10-CM | POA: Insufficient documentation

## 2017-07-10 DIAGNOSIS — Z9013 Acquired absence of bilateral breasts and nipples: Secondary | ICD-10-CM | POA: Diagnosis not present

## 2017-07-10 DIAGNOSIS — I480 Paroxysmal atrial fibrillation: Secondary | ICD-10-CM | POA: Insufficient documentation

## 2017-07-10 DIAGNOSIS — D649 Anemia, unspecified: Secondary | ICD-10-CM | POA: Insufficient documentation

## 2017-07-10 DIAGNOSIS — I5032 Chronic diastolic (congestive) heart failure: Secondary | ICD-10-CM | POA: Insufficient documentation

## 2017-07-10 DIAGNOSIS — Z8711 Personal history of peptic ulcer disease: Secondary | ICD-10-CM | POA: Insufficient documentation

## 2017-07-10 DIAGNOSIS — K3184 Gastroparesis: Secondary | ICD-10-CM | POA: Insufficient documentation

## 2017-07-10 DIAGNOSIS — E1022 Type 1 diabetes mellitus with diabetic chronic kidney disease: Secondary | ICD-10-CM | POA: Insufficient documentation

## 2017-07-10 DIAGNOSIS — Z7982 Long term (current) use of aspirin: Secondary | ICD-10-CM | POA: Diagnosis not present

## 2017-07-10 DIAGNOSIS — I495 Sick sinus syndrome: Secondary | ICD-10-CM | POA: Insufficient documentation

## 2017-07-10 DIAGNOSIS — Z8249 Family history of ischemic heart disease and other diseases of the circulatory system: Secondary | ICD-10-CM | POA: Insufficient documentation

## 2017-07-10 DIAGNOSIS — M109 Gout, unspecified: Secondary | ICD-10-CM | POA: Insufficient documentation

## 2017-07-10 DIAGNOSIS — R0989 Other specified symptoms and signs involving the circulatory and respiratory systems: Secondary | ICD-10-CM | POA: Insufficient documentation

## 2017-07-10 DIAGNOSIS — Z853 Personal history of malignant neoplasm of breast: Secondary | ICD-10-CM | POA: Diagnosis not present

## 2017-07-10 DIAGNOSIS — Z91048 Other nonmedicinal substance allergy status: Secondary | ICD-10-CM | POA: Diagnosis not present

## 2017-07-10 DIAGNOSIS — Z953 Presence of xenogenic heart valve: Secondary | ICD-10-CM | POA: Diagnosis not present

## 2017-07-10 DIAGNOSIS — D696 Thrombocytopenia, unspecified: Secondary | ICD-10-CM | POA: Insufficient documentation

## 2017-07-10 DIAGNOSIS — Z79899 Other long term (current) drug therapy: Secondary | ICD-10-CM | POA: Diagnosis not present

## 2017-07-10 DIAGNOSIS — Z794 Long term (current) use of insulin: Secondary | ICD-10-CM | POA: Insufficient documentation

## 2017-07-10 DIAGNOSIS — Z951 Presence of aortocoronary bypass graft: Secondary | ICD-10-CM | POA: Diagnosis not present

## 2017-07-10 DIAGNOSIS — I2510 Atherosclerotic heart disease of native coronary artery without angina pectoris: Secondary | ICD-10-CM | POA: Diagnosis not present

## 2017-07-10 DIAGNOSIS — D509 Iron deficiency anemia, unspecified: Secondary | ICD-10-CM | POA: Insufficient documentation

## 2017-07-10 DIAGNOSIS — Z8571 Personal history of Hodgkin lymphoma: Secondary | ICD-10-CM | POA: Insufficient documentation

## 2017-07-10 LAB — BASIC METABOLIC PANEL
ANION GAP: 9 (ref 5–15)
BUN: 42 mg/dL — AB (ref 6–20)
CALCIUM: 9.7 mg/dL (ref 8.9–10.3)
CO2: 29 mmol/L (ref 22–32)
Chloride: 97 mmol/L — ABNORMAL LOW (ref 101–111)
Creatinine, Ser: 1.51 mg/dL — ABNORMAL HIGH (ref 0.44–1.00)
GFR calc Af Amer: 41 mL/min — ABNORMAL LOW (ref >60)
GFR, EST NON AFRICAN AMERICAN: 35 mL/min — AB (ref >60)
GLUCOSE: 318 mg/dL — AB (ref 65–99)
Potassium: 4.3 mmol/L (ref 3.5–5.1)
Sodium: 135 mmol/L (ref 135–145)

## 2017-07-10 LAB — CBC
HCT: 39.8 % (ref 36.0–46.0)
Hemoglobin: 13.3 g/dL (ref 12.0–15.0)
MCH: 30.6 pg (ref 26.0–34.0)
MCHC: 33.4 g/dL (ref 30.0–36.0)
MCV: 91.7 fL (ref 78.0–100.0)
PLATELETS: 120 10*3/uL — AB (ref 150–400)
RBC: 4.34 MIL/uL (ref 3.87–5.11)
RDW: 14.6 % (ref 11.5–15.5)
WBC: 7.1 10*3/uL (ref 4.0–10.5)

## 2017-07-10 NOTE — Patient Instructions (Signed)
Labs drawn today (if we do not call you, then your lab work was stable)   Your physician recommends that you schedule a follow-up appointment in: 3 months with Dr. McLean    

## 2017-07-10 NOTE — Progress Notes (Signed)
Patient ID: [name] [name], female   DOB: [birth year], 66 y.o.   MRN: [id number] PCP: Dr. Hilma Favors Cardiology: Dr. Aundra Dubin  67 yo with history of CAD s/p CABG in 12/05 and redo in 1/11, bioprosthetic AVR, diastolic CHF, and sick sinus syndrome s/p PCM presents for cardiology followup.  She had initial CABG with SVG-RCA in 12/05 followed by redo SVG-RCA and bioprosthetic AVR in 1/11.  She has had diastolic CHF and is on Lasix.  In 9/14, she had a CPX test.  This showed severe functional limitation with ischemic ECG changes (inferolateral ST depression and ST elevation in V1 and V2). She had chest pain.  She had R/LHC in 9/14.  This showed elevated left and right heart filling pressures, patent SVG-PDA, and 80% distal LM/80% ostial LAD stenosis.  She was not a candidate for redo CABG (had 2 prior sternotomies as well as chest radiation for Hodgkins lymphoma).  She had DES from left main into proximal LAD and will need long-term DAPT => Brilinta was used as she has been a poor responder to Plavix in the past.  She re-developed angina in 11/15 and was admitted with unstable angina.  LHC showed 80% ostial LM and patent SVG-RCA. She had DES to ostial LM. Echo in 1/18 showed EF 60-65%, normal bioprosthetic aortic valve, mild-moderate MR, PASP 35 mmHg.   She has chronic iron deficiency anemia thought to be due to a slow GI bleed.  Last EGD was unremarkable and a capsule endoscopy was also unremarkable.  She has had periodic transfusions; However, recently hemoglobin has been stable.    She had bilateral mastectomy for DCIS in 52/84 without complication.  She has an insulin pump followed by an endocrinologist at [facility].   Given myalgias with statins, she was started on Praluent.  LDL is now excellent.   In the past she has had short runs of atrial fibrillation.  She is not anticoagulated. Planned for Watchman placement, but given her nickel allergy, we were unable to place a Watchman.    Cardiolite in 2/18 showed  no ischemia or infarction.     Diagnosed with OSA, now on CPAP.   Given ongoing problems with exertional dyspnea and occasional chest pain, Cardiolite was repeated in 9/18 and showed inferior changes.  She underwent left/right heart cath in 9/18.  This showed elevated filling pressures, R>L.  There was 75+% ostial LCx stenosis.  However, there were 2 layers of stent from the mid left main into the ostial LAD, and LCx intervention was thought to be very risky.  It was decided to manage her medically.  Echo was repeated in 10/18 showing EF 55-60%, moderate to severe TR.    She returns for followup of CHF and CAD today.  She is stable clinically. Still short of breath walking up hills/inclines but generally ok walking on flat ground.  She is going to pulmonary rehab, feels like this has helped. She feels occasional palpitations that she attributes to atrial fibrillation but nothing prolonged (usually < 1 minute).  Weight has been stable.  She recently had diarrhea and was treated for C difficile empirically but this has resolved.  No chest pain. No orthopnea/PND.        Labs (7/14): hemoglobin 11 Labs (9/14): K 3.9, creatinine 1.48, hemoglobin 7.3 Labs (10/14): hemoglobin 7.7 Labs (11/14): K 3.8, creatinine 1.5 Labs (12/14): HCT 37.7 Labs (1/15): LDL 106, HDL 41, K 3.9, creatinine 1.6, LFTs normal Labs (2/15): K 3.7, creatinine 1.1, BNP 71 Labs (3/15):  HCT 36.7 Labs (4/15): K 3.7, creatinine 1.1, HCT 35.5 Labs (7/15): HCT 36.4 Labs (11/15): HCT 32.1, K 4.2, creatinine 1.06 Labs (12/15): hgb 12 Labs (1/16): HCT 37.1 Labs (3/16): LDL 25, HDL 42, elevated transaminases Labs (6/16): HCT 39.6, plts 146 Labs (7/16): LDL 59, HDL 41, AST 34, ALT 49, AP 125 Labs (11/16): HCT 38.8, alkaline phosphatase 156, AST/ALT normal, K 4, creatinine 1.0 Labs (3/17): K 4.4, creatinine 1.4, LFTs normal, hgb 12.5 Labs (5/17): HCT 41.8, K 4, creatinine 1.3 Labs (9/17): K 4, creatinine 0.43, hgb 13.6 Labs (10/17):  LDL 42, HDL 43 Labs (1/18): K 4.3, creatinine 1.1 => 1.19, AST 66, ALT 67, hgb 14.2, BNP 72 Labs (2/18): K 3.9, creatinine 1.2, BNP 49.6, HCT 42.1 Labs (4/18): hgb 13.8 Labs (5/18): HCT 41.7, plts 98, K 4.3, creatinine 1.11, BNP 180 Labs (7/18): hgb 12, plts 110, K 4, creatinine 1.23, AST 38, ALT 44 Labs (8/18): K 5, creatinine 1.17 => 2.1, BUN 33 => 92, BNP 134 Labs (9/18): K 3.6, creatinine 1.42 => 1.44. Hgb 14 => 13.5 Labs (10/18): K 3.5, creatinine 1.21, hgb 11.9, LDL 16, HDL 40 Labs (12/18): K 3.7, creatinine 1.4  PMH: 1. Sick sinus syndrome s/p [employer] PCM.  2. HTN 3. H/o SBO 4. Renal cell carcinoma s/p right nephrectomy in 2008.  5. 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  Fibroscan in 11/14 showed advanced fibrosis concerning for cirrhosis. EGD in 11/14 showed no varices.  6. Diastolic CHF: Echo (0/96) with EF 60-65%, bioprosthetic aortic valve with mean gradient 15 mmHg, mild MR.  CPX (9/14): peak VO2 10.4, VE/VCO2 43.8, inferolateral ST depression and ST elevation in V1/V2 with severe dyspnea and chest pain, severe functional limitation with ischemic changes.  Echo (9/14) with EF 60-65%, normal RV size and systolic function, mild-moderate MR, bioprosthetic aortic valve normal, PA systolic pressure 51 mmHg, no evidence for pericardial constriction though exam incomplete for constriction.  Echo (11/15) with EF 55-60%, bioprosthetic aortic valve, mild-moderate MR, moderate TR, PA systolic pressure 42 mmHg.  - TEE (5/17) with EF 60-65%, normal bioprosthetic aortic valve with mean gradient 13 mmHg, normal RV size and systolic function. - Echo (1/18) with EF 60-65%, normal bioprosthetic aortic valve, mild-moderate MR, PASP 35 mmHg.  - Echo (10/18) with EF 55-60%, bioprosthetic aortic valve mean gradient 10 mmHg, MAC with mild mitral stenosis (mean gradient 7) and mild mitral regurgitation, moderate to severe TR, PASP 43 mmHg.  - RHC (9/18): mean RA 13, PA 44/18 mean 33, mean PCWP 19, CI  3.45, PVR 2.7 WU.  7. TAH-BSO 8. Type I diabetes: Has insulin pump.  9. H/o ischemic colitis. 10. H/o PUD. 11. Diabetic gastroparesis. 12. Hyperlipidemia: Myalgias with Crestor and atorvastatin, elevated LFTs with simvastatin.  Myalgias with > 10 mg pravastatin. Now on Praluent.  13. CKD 14. Bicuspid aortic valve s/p 19 mm Edwards pericardial valve in 1/11 (had post-operative Dresslers syndrome).   15. CAD: CABG with SVG-RCA in 12/05.  Redo SVG-RCA in 1/11 with AVR.  LHC/RHC (9/14) with mean RA 18, PA 62/25 mean 43, mean PCWP 26, CI 2.8, 70-80% distal LM stenosis, 80% ostial LAD stenosis, total occlusion RCA, SVG-PDA patent.  Patient had DES LM into LAD.  Plan to continue long-term ASA/Brilinta (poor responder to Plavix).  ETT-cardiolite (4/15) with 6' exercise, EF 60%, ST depression in recovery and mild chest pain, no ischemia or infarction by perfusion images.  Unstable angina 11/15 with 80% LM, patent SVG-RCA => DES to ostial LM.   -  Lexiscan Cardiolite (2/18): EF 62%, no ischemia or infarction (normal).  She had a significant reaction to [place] and probably should not get again.  - Cardiolite (9/18): EF 72%, prior inferior infarct with mild peri-infarct ischemia.  - LHC (9/18): 2 layers stent left main - ostial LAD with 50% dLM in-stent restenosis, 75+% ostial LCx, totally occluded RCA, SVG-RCA patent.  Medical management planned.  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  17. Anemia: Iron-deficiency.  Suspect chronic GI blood loss.  EGD in 9/14 was unremarkable.  Capsule endoscopy 10/14 was unremarkable.  Has required periodic blood transfusions.  18. Atrial fibrillation: Paroxysmal, noted by [facility] interrogation.  19. Carotid stenosis: Carotid dopplers (2/15) with 40-59% bilateral stenosis. Carotid dopplers (3/16) with 40-59% bilateral ICA stenosis. Carotid dopplers (3/17) with 40-59% bilateral ICA stenosis.  - Carotid dopplers (3/18) with mild BICA  stenosis.  20. 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  22. C difficile colitis 2017 23. Gout 24. ?TIA: head CT negative.  25. OSA: Moderate, uses CPAP.  26. Chronic thrombocytopenia  SH: Married, lives in [place], nonsmoker. Former school principal.  FH: CAD  ROS: All systems reviewed and negative except as per HPI.   Current Outpatient Medications  Medication Sig Dispense Refill  . ALPRAZolam (XANAX) 0.5 MG tablet Take 0.5 mg by mouth at bedtime. May take additional dose daily for anxiety    . amLODipine (NORVASC) 5 MG tablet Take 0.5 tablets (2.5 mg total) by mouth daily. 45 tablet 3  . APIDRA 100 UNIT/ML injection Inject 46 Units into the skin as directed. INFUSE THROUGH INSULIN PUMP UTD::[place] 46.15 units daily, Bolus depends on carb intake  2  . aspirin EC 81 MG tablet Take 81 mg by mouth at bedtime.    Marland Kitchen azithromycin (ZITHROMAX) 500 MG tablet 1 tablet by mouth one hour prior to dental appointment/procedure 2 tablet 0  . Calcium Carbonate-Vitamin D (RA CALCIUM PLUS VITAMIN D) 600-400 MG-UNIT per tablet Take 1 tablet by mouth daily.     . Cholecalciferol 2000 UNITS TABS Take 1 tablet by mouth every morning.     . clobetasol (OLUX) 0.05 % topical foam Apply 1 application topically daily.  0  . Coenzyme Q10 (CO Q 10) 100 MG CAPS Take 1 capsule by mouth at bedtime.     . colchicine 0.6 MG tablet Take 0.6 mg by mouth at bedtime. And,Take 1 tablet at onset and repeate in 1 hour and then 1 daily x 2 (FOR FLARES)     . docusate sodium (COLACE) 100 MG capsule Take 100 mg by mouth at bedtime.    . Fe Fum-FA-B Cmp-C-Zn-Mg-Mn-Cu (HEMATINIC PLUS COMPLEX) 106-1 MG TABS Take 1 tablet by mouth every Monday, Wednesday, and Friday. Take 1 tablet oraly three times a week or as directed daily with with a Vitamin C tablet 90 each 0  . glucagon (GLUCAGON EMERGENCY) 1 MG injection  Inject 1 mg into the vein once as needed (for severe reaction). Reported on 12/05/2015    . isosorbide mononitrate (IMDUR) 120 MG 24 hr tablet Take 120 mg by mouth daily.    Marland Kitchen latanoprost (XALATAN) 0.005 % ophthalmic solution Place 1 drop into both eyes at bedtime.    . Magnesium Oxide 250 MG TABS Take 250 mg by mouth at bedtime.     . metolazone (ZAROXOLYN) 2.5 MG tablet Take 2.5 mg (1 tab) once  as directed by CHF clinic. Call before taking [phone] 3 tablet 0  . metoprolol tartrate (LOPRESSOR) 50 MG tablet TAKE 1 AND 1/2 TABLETS(75 MG) BY MOUTH TWICE DAILY 270 tablet 0  . Multiple Vitamins-Minerals (MULTIVITAMIN WITH MINERALS) tablet Take 1 tablet by mouth every morning.     . nitroGLYCERIN (NITROLINGUAL) 0.4 MG/SPRAY spray Place 1 spray under the tongue every 5 (five) minutes as needed for chest pain. 12 g 1  . pantoprazole (PROTONIX) 40 MG tablet Take 1 tablet (40 mg total) by mouth daily. 30 tablet 11  . Polyethyl Glycol-Propyl Glycol (SYSTANE) 0.4-0.3 % SOLN Apply 1 drop to eye daily as needed (dry eyes).    . potassium chloride (MICRO-K) 10 MEQ CR capsule Take 4 capsules (40 mEq total) by mouth 2 (two) times daily. 240 capsule 3  . PRALUENT 150 MG/ML SOPN INJECT 1 PEN INTO THE SKIN EVERY 14 (FOURTEEN) DAYS. 2 pen 3  . pravastatin (PRAVACHOL) 10 MG tablet TAKE 1 TABLET BY MOUTH ONCE DAILY 90 tablet 3  . spironolactone (ALDACTONE) 25 MG tablet Take 0.5 tablets (12.5 mg total) by mouth daily. 15 tablet 3  . ticagrelor (BRILINTA) 60 MG TABS tablet TAKE 1 TABLET(60 MG) BY MOUTH TWICE DAILY 180 tablet 2  . torsemide (DEMADEX) 20 MG tablet Take 80 mg (4 tabs) in AM, then 60 mg (3 tabs) in PM 210 tablet 6  . triamcinolone cream (KENALOG) 0.5 % Apply 1 application topically daily as needed (inflamation).     Marland Kitchen ULORIC 40 MG tablet Take 40 mg by mouth at bedtime.   11  . vitamin C (ASCORBIC ACID) 500 MG tablet Take 500 mg by mouth 3 (three) times a week.     No current facility-administered  medications for this encounter.     BP 118/72   Pulse 71   Wt 115 lb 12.8 oz (52.5 kg)   SpO2 97%   BMI 20.51 kg/m  General: NAD, thin Neck: No JVD, no thyromegaly or thyroid nodule.  Lungs: Clear to auscultation bilaterally with normal respiratory effort. CV: Nondisplaced PMI.  Heart regular S1/S2, no S3/S4, 2/6 SEM RUSB with clear S2.  No peripheral edema.  No carotid bruit.  Normal pedal pulses.  Abdomen: Soft, nontender, no hepatosplenomegaly, no distention.  Skin: Intact without lesions or rashes.  Neurologic: Alert and oriented x 3.  Psych: Normal affect. Extremities: No clubbing or cyanosis.  HEENT: Normal.   Assessment/Plan:  1. CAD: s/p SVG-RCA in 12/05, then redo SVG-RCA in 1/11--> distal left main/proximal LAD severe stenosis in 9/14.  PCI with DES to distal LM/proximal LAD was done rather than bypass as she would have been a poor candidate for redo sternotomy (2 prior sternotomies and history of chest wall radiation for Hodgkins disease).  Recurrent unstable angina with 80% ostial LM treated with repeat DES in 11/15.  Lexiscan Cardiolite in 9/18 inferior changes.  Repeat LHC (9/18) showed 50% distal left main in-stent restenosis and 75+% ostial LCx stenosis. I reviewed the films with interventional cardiology => would be very difficult to intervene on the ostial LCx through 2 overlapping stents from left main into the LAD.  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  Given no ACS, we planned medical management.  I increased her Imdur.  She has had minimal chest pain.  - Continue Brilinta 60 mg bid (poor responder to plavix) long-term given recurrent left main disease.  This has been complicated in the past by suspected slow GI  bleed. Most recent hemoglobin was normal. - Continue ASA 81, statin, metoprolol, and Imdur 120 mg daily.    2. Bioprosthetic aortic valve replacement: In setting of bicuspid aortic valve, thoracic aorta not dilated on 2017 TEE.   Valve looked ok on 10/18 echo.   3. Chronic diastolic CHF: Most recent echo in 10/18 showed normal LV systolic function but also showed moderate to severe TR.  RHC was concerning for right > left heart failure.  I am concerned that her volume overload and dyspnea are related to primarily RV failure at this point. She is doing better, NYHA class II symptoms probably, pulmonary rehab is helping.  She is not volume overloaded on exam.  - Continue torsemide 80 qam/60 qpm. BMET today.   - Continue spironolactone 12.5 mg daily, there is some evidence for benefit in diastolic CHF patients.  4. CKD:  Patient has a single kidney s/p right nephrectomy.  CKD stage 3.  BMET today. 5. Hyperlipidemia: She has not been able to tolerate any higher potency statin than pravastatin 10 mg daily.   - Continue Praluent, good lipids 10/18. .        6. Anemia: Suspected slow GI bleed.  Unremarkable EGD and capsule endoscopy in fall 2014.  Unfortunately, needs to stay on Brilinta long-term (Plavix non-responder).  Will need to follow closely (sees hematology). Recently, hemoglobin has been more stable.   - CBC today.  7. Atrial fibrillation: Paroxysmal, brief episodes noted on PCM interrogation. Given GI bleeding and need to be on Brilinta, risks have been thought to outweigh benefits of anticoagulation.  She is in NSR today.  She has occasional palpitations that do not last long, she attributes them to atrial fibrillation.  She is not a good Multaq candidate with CHF.  If she needs an antiarrhythmic, consider Tikosyn or Sotalol.  - We were unable to place Watchman device due to nickel allergy.   8. Carotid bruit: Only mild stenosis on last dopplers in 3/18. 9. SSS: Pacemaker in place.  10. HTN: controlled. Continue current regimen.   11. OSA: Moderate, using CPAP.  12. ?TIA: Transient dysarthria in 3/18, could have been afib-related TIA.  No recurrent symptoms.  13. Thrombocytopenia: Chronic.  Needs to continue to follow  with heme/onc. CBC today.    Followup in 3 months.   [name] 07/10/2017

## 2017-07-11 ENCOUNTER — Encounter (HOSPITAL_COMMUNITY)
Admission: RE | Admit: 2017-07-11 | Discharge: 2017-07-11 | Disposition: A | Discharge status: Home / Self Care | Payer: Medicare Other | Source: Ambulatory Visit | Attending: Cardiology | Admitting: Cardiology

## 2017-07-11 DIAGNOSIS — I5032 Chronic diastolic (congestive) heart failure: Secondary | ICD-10-CM | POA: Diagnosis not present

## 2017-07-11 NOTE — Progress Notes (Signed)
Daily Session Note  Patient Details  Name: [name] [name] MRN: [id number] Date of Birth: 20-May-1951 Referring Provider:     PULMONARY REHAB OTHER RESP ORIENTATION from 05/24/2017 in [place]  Referring Provider  Dr. Aundra Dubin      Encounter Date: 07/11/2017  Check In: Session Check In - 07/11/17 1040      Check-In   Location  [facility]    Staff Present  Suzanne Boron, BS, EP, Exercise Physiologist;Debra Wynetta Emery, RN, BSN    Supervising physician immediately available to respond to emergencies  See telemetry face sheet for immediately available MD    Medication changes reported      No    Fall or balance concerns reported     No    Warm-up and Cool-down  Performed as group-led instruction    Resistance Training Performed  Yes    VAD Patient?  No      Pain Assessment   Currently in Pain?  No/denies    Pain Score  0-No pain    Multiple Pain Sites  No       Capillary Blood Glucose: Results for orders placed or performed during the hospital encounter of 07/10/17 (from the past 24 hour(s))  CBC     Status: Abnormal   Collection Time: 07/10/17  3:27 PM  Result Value Ref Range   WBC 7.1 4.0 - 10.5 K/uL   RBC 4.34 3.87 - 5.11 MIL/uL   Hemoglobin 13.3 12.0 - 15.0 g/dL   HCT 39.8 36.0 - 46.0 %   MCV 91.7 78.0 - 100.0 fL   MCH 30.6 26.0 - 34.0 pg   MCHC 33.4 30.0 - 36.0 g/dL   RDW 14.6 11.5 - 15.5 %   Platelets 120 (L) 150 - 400 K/uL  Basic metabolic panel     Status: Abnormal   Collection Time: 07/10/17  3:27 PM  Result Value Ref Range   Sodium 135 135 - 145 mmol/L   Potassium 4.3 3.5 - 5.1 mmol/L   Chloride 97 (L) 101 - 111 mmol/L   CO2 29 22 - 32 mmol/L   Glucose, Bld 318 (H) 65 - 99 mg/dL   BUN 42 (H) 6 - 20 mg/dL   Creatinine, Ser 1.51 (H) 0.44 - 1.00 mg/dL   Calcium 9.7 8.9 - 10.3 mg/dL   GFR calc non Af Amer 35 (L) >60 mL/min   GFR calc Af Amer 41 (L) >60 mL/min   Anion gap 9 5 - 15   *Note: Due to a large number of  results and/or encounters for the requested time period, some results have not been displayed. A complete set of results can be found in Results Review.      Social History   Tobacco Use  Smoking Status Never Smoker  Smokeless Tobacco Never Used    Goals Met:  Independence with exercise equipment Improved SOB with ADL's Using PLB without cueing & demonstrates good technique Exercise tolerated well No report of cardiac concerns or symptoms Strength training completed today  Goals Unmet:  Not Applicable  Comments: Check out 1145   Dr. Sinda Du is Medical Director for [facility].

## 2017-07-11 NOTE — Progress Notes (Signed)
Pulmonary Individual Treatment Plan  Patient Details  Name: [name] [name] MRN: [id number] Date of Birth: 1951-05-31 Referring Provider:     PULMONARY REHAB OTHER RESP ORIENTATION from 05/24/2017 in [place]  Referring Provider  Dr. Aundra Dubin      Initial Encounter Date:    [place] from 05/24/2017 in [place]  Date  05/24/17  Referring Provider  Dr. Aundra Dubin      Visit Diagnosis: Diastolic CHF, chronic ([place])  Patient's Home Medications on Admission:   Current Outpatient Medications:  .  ALPRAZolam (XANAX) 0.5 MG tablet, Take 0.5 mg by mouth at bedtime. May take additional dose daily for anxiety, Disp: , Rfl:  .  amLODipine (NORVASC) 5 MG tablet, Take 0.5 tablets (2.5 mg total) by mouth daily., Disp: 45 tablet, Rfl: 3 .  APIDRA 100 UNIT/ML injection, Inject 46 Units into the skin as directed. INFUSE THROUGH INSULIN PUMP UTD::[place] 46.15 units daily, [place]us depends on carb intake, Disp: , Rfl: 2 .  aspirin EC 81 MG tablet, Take 81 mg by mouth at bedtime., Disp: , Rfl:  .  azithromycin (ZITHROMAX) 500 MG tablet, 1 tablet by mouth one hour prior to dental appointment/procedure, Disp: 2 tablet, Rfl: 0 .  Calcium Carbonate-Vitamin D (RA CALCIUM PLUS VITAMIN D) 600-400 MG-UNIT per tablet, Take 1 tablet by mouth daily. , Disp: , Rfl:  .  Cholecalciferol 2000 UNITS TABS, Take 1 tablet by mouth every morning. , Disp: , Rfl:  .  clobetasol (OLUX) 0.05 % topical foam, Apply 1 application topically daily., Disp: , Rfl: 0 .  Coenzyme Q10 (CO Q 10) 100 MG CAPS, Take 1 capsule by mouth at bedtime. , Disp: , Rfl:  .  colchicine 0.6 MG tablet, Take 0.6 mg by mouth at bedtime. And,Take 1 tablet at onset and repeate in 1 hour and then 1 daily x 2 (FOR FLARES) , Disp: , Rfl:  .  docusate sodium (COLACE) 100 MG capsule, Take 100 mg by mouth at bedtime., Disp: , Rfl:  .  Fe Fum-FA-B Cmp-C-Zn-Mg-Mn-Cu (HEMATINIC PLUS COMPLEX) 106-1  MG TABS, Take 1 tablet by mouth every Monday, Wednesday, and Friday. Take 1 tablet oraly three times a week or as directed daily with with a Vitamin C tablet, Disp: 90 each, Rfl: 0 .  glucagon (GLUCAGON EMERGENCY) 1 MG injection, Inject 1 mg into the vein once as needed (for severe reaction). Reported on 12/05/2015, Disp: , Rfl:  .  isosorbide mononitrate (IMDUR) 120 MG 24 hr tablet, Take 120 mg by mouth daily., Disp: , Rfl:  .  latanoprost (XALATAN) 0.005 % ophthalmic solution, Place 1 drop into both eyes at bedtime., Disp: , Rfl:  .  Magnesium Oxide 250 MG TABS, Take 250 mg by mouth at bedtime. , Disp: , Rfl:  .  metolazone (ZAROXOLYN) 2.5 MG tablet, Take 2.5 mg (1 tab) once as directed by CHF clinic. Call before taking [phone], Disp: 3 tablet, Rfl: 0 .  metoprolol tartrate (LOPRESSOR) 50 MG tablet, TAKE 1 AND 1/2 TABLETS(75 MG) BY MOUTH TWICE DAILY, Disp: 270 tablet, Rfl: 0 .  Multiple Vitamins-Minerals (MULTIVITAMIN WITH MINERALS) tablet, Take 1 tablet by mouth every morning. , Disp: , Rfl:  .  nitroGLYCERIN (NITROLINGUAL) 0.4 MG/SPRAY spray, Place 1 spray under the tongue every 5 (five) minutes as needed for chest pain., Disp: 12 g, Rfl: 1 .  pantoprazole (PROTONIX) 40 MG tablet, Take 1 tablet (40 mg total) by mouth daily., Disp: 30 tablet, Rfl:  11 .  Polyethyl Glycol-Propyl Glycol (SYSTANE) 0.4-0.3 % SOLN, Apply 1 drop to eye daily as needed (dry eyes)., Disp: , Rfl:  .  potassium chloride (MICRO-K) 10 MEQ CR capsule, Take 4 capsules (40 mEq total) by mouth 2 (two) times daily., Disp: 240 capsule, Rfl: 3 .  PRALUENT 150 MG/ML SOPN, INJECT 1 PEN INTO THE SKIN EVERY 14 (FOURTEEN) DAYS., Disp: 2 pen, Rfl: 3 .  pravastatin (PRAVACHOL) 10 MG tablet, TAKE 1 TABLET BY MOUTH ONCE DAILY, Disp: 90 tablet, Rfl: 3 .  spironolactone (ALDACTONE) 25 MG tablet, Take 0.5 tablets (12.5 mg total) by mouth daily., Disp: 15 tablet, Rfl: 3 .  ticagrelor (BRILINTA) 60 MG TABS tablet, TAKE 1 TABLET(60 MG) BY MOUTH  TWICE DAILY, Disp: 180 tablet, Rfl: 2 .  torsemide (DEMADEX) 20 MG tablet, Take 80 mg (4 tabs) in AM, then 60 mg (3 tabs) in PM, Disp: 210 tablet, Rfl: 6 .  triamcinolone cream (KENALOG) 0.5 %, Apply 1 application topically daily as needed (inflamation). , Disp: , Rfl:  .  ULORIC 40 MG tablet, Take 40 mg by mouth at bedtime. , Disp: , Rfl: 11 .  vitamin C (ASCORBIC ACID) 500 MG tablet, Take 500 mg by mouth 3 (three) times a week., Disp: , Rfl:   Past Medical History: Past Medical History:  Diagnosis Date  . Anxiety   . Aortic stenosis    a. bicuspid aortic valve; mean gradient of 20 mmHg in 2/10; b. s/p bioprosth AVR (19-mm Edwards pericardial valve) with a redo coronary artery bypass graft procedure in 07/2009; postoperative Dressler's syndrome    . Arthritis   . Carcinoma, renal cell ([place]) 05/2007   Laparoscopic right nephrectomy  . Chronic diastolic CHF (congestive heart failure) ([place])    a. 9.2014 EchoP EF 60-65%, no rwma, bioprosth AVR, mean gradient of 85mHg, mild to mod MR, PASP 563mg.  . CKD (chronic kidney disease), stage II   . Colitis, ischemic ([facility]  . Coronary artery disease    a. initial CABG with SVG-RCA in 12/05. b. redo SVG-RCA and bioprosthetic AVR in 1/11. d. 9/14 - PCI with DES to distal LM/proximal LAD was done rather than bypass as she would have been a poor candidate for redo sternotomy. d. S/p DES to prox LM 05/2014.  . Diabetes mellitus 03/2010    TYPE II: Hemoglobin A1c of 7.4 in  03/2010; 8.4 in 06/2010; treated with insulin pump  . Duodenal ulcer    Remote; H. pylori positive  . Gastroparesis due to DM ([place]10/04/2012  . Genetic testing 01/14/2017   Ms. Pillsbury underwent genetic counseling and testing for hereditary cancer syndromes on 01/01/2017. Her results were negative for mutations in all 83 genes analyzed by [facility]'s 83-gene Common Hereditary Cancers Panel. Genes analyzed include: ALK, APC, ATM, AXIN2, BAP1, BARD1, BLM, BMPR1A, BRCA1, BRCA2, BRIP1,  CASR, CDC73, CDH1, CDK4, CDKN1B, CDKN1C, CDKN2A, CEBPA, CHEK2, CTNNA1, DICER1, DIS3L2,   . Hodgkin's disease ([facility]   [place]le radiation therapy  . Hyperlipidemia   . Hyperplastic gastric polyp 06/23/2012  . Hypertension   . Iron deficiency anemia    a. 03/2013 EGD: essentially normal. Possible slow GIB.  . Migraines   . NASH (nonalcoholic steatohepatitis) 1999   -biopsy in 1999  . OSA on CPAP 04/24/2017   Moderate with AHI 22/hr now on CPAP at 18cm H2o  . Osteopenia     hip on DEXA in October 2007.  . Osteoporosis   . PAF (paroxysmal atrial fibrillation) ([place] [place]  a.  h/o brief episodes noted on ppm interrogation. b. given GI bleeding and need to be on both ASA 81 and Brilinta, risks likely outweigh benefits of anticoagulation.   . Small bowel obstruction (H[facility])    Recurrent; resolved after resection of a lipoma  . Syncope    a. H/o recurrent syncope with pauses on loop recorder s/p Medtronic pacemaker 07/2012.    Tobacco Use: Social History   Tobacco Use  Smoking Status Never Smoker  Smokeless Tobacco Never Used    Labs: Recent Review Flowsheet Data    Labs for ITP Cardiac and Pulmonary Rehab Latest Ref Rng & Units 11/04/2015 04/27/2016 04/19/2017 04/19/2017 05/15/2017   Cholestrol 0 - 200 mg/dL 115(L) 125 - - 80   LDLCALC 0 - 99 mg/dL 33 42 - - 16   LDLDIRECT mg/dL - - - - -   HDL >40 mg/dL 41(L) 43(L) - - 40(L)   Trlycerides <150 mg/dL 207(H) 199(H) - - 119   Hemoglobin A1c 4.8 - 5.6 % - - - - -   PHART 7.350 - 7.450 - - - - -   PCO2ART 35.0 - 45.0 mmHg - - - - -   HCO3 20.0 - 28.0 mmol/L - - 28.1(H) 27.3 -   TCO2 22 - 32 mmol/L - - 29 29 -   ACIDBASEDEF 0.0 - 2.0 mmol/L - - - - -   O2SAT % - - 78.0 75.0 -      Capillary Blood Glucose: Lab Results  Component Value Date   GLUCAP 198 (H) 04/19/2017   GLUCAP 144 (H) 04/19/2017   GLUCAP 152 (H) 04/19/2017   GLUCAP 164 (H) 04/19/2017   GLUCAP 170 (H) 04/19/2017     Pulmonary Assessment Scores:   Pulmonary  Function Assessment:   Exercise Target Goals:    Exercise Program Goal: Individual exercise prescription set with THRR, safety & activity barriers. Participant demonstrates ability to understand and report RPE using BORG scale, to self-measure pulse accurately, and to acknowledge the importance of the exercise prescription.  Exercise Prescription Goal: Starting with aerobic activity 30 plus minutes a day, 3 days per week for initial exercise prescription. Provide home exercise prescription and guidelines that participant acknowledges understanding prior to discharge.  Activity Barriers & Risk Stratification: Activity Barriers & Cardiac Risk Stratification - 05/24/17 1334      Activity Barriers & Cardiac Risk Stratification   Activity Barriers  Deconditioning;Muscular Weakness;Shortness of Breath;Balance Concerns;History of Falls    Cardiac Risk Stratification  High       6 Minute Walk: 6 Minute Walk    Row Name 05/24/17 1333         6 Minute Walk   Phase  Initial     Distance  1050 feet     Distance % Change  0 %     Distance Feet Change  0 ft     Walk Time  6 minutes     # of Rest Breaks  0     MPH  1.98     METS  2.52     RPE  15     Perceived Dyspnea   13     VO2 Peak  9.79     Symptoms  No     Resting HR  71 bpm     Resting BP  102/52     Resting Oxygen Saturation   96 %     Exercise Oxygen Saturation  during 6 min walk  95 %  Max Ex. HR  77 bpm     Max Ex. BP  120/60     2 Minute Post BP  100/56        Oxygen Initial Assessment: Oxygen Initial Assessment - 05/24/17 1504      Home Oxygen   Home Oxygen Device  None    Sleep Oxygen Prescription  None    Home Exercise Oxygen Prescription  None    Home at Rest Exercise Oxygen Prescription  None      Initial 6 min Walk   Oxygen Used  None      Program Oxygen Prescription   Program Oxygen Prescription  None       Oxygen Re-Evaluation: Oxygen Re-Evaluation    Row Name 06/20/17 1611 07/11/17 0838            Program Oxygen Prescription   Program Oxygen Prescription  None  None        Home Oxygen   Home Oxygen Device  None  None      Sleep Oxygen Prescription  None  None      Home Exercise Oxygen Prescription  None  None      Home at Rest Exercise Oxygen Prescription  None  None        Goals/Expected Outcomes   Short Term Goals  To learn and understand importance of monitoring SPO2 with pulse oximeter and demonstrate accurate use of the pulse oximeter.;To learn and understand importance of maintaining oxygen saturations>88%;To learn and demonstrate proper pursed lip breathing techniques or other breathing techniques.  To learn and exhibit compliance with exercise, home and travel O2 prescription;To learn and understand importance of monitoring SPO2 with pulse oximeter and demonstrate accurate use of the pulse oximeter.;To learn and understand importance of maintaining oxygen saturations>88%;To learn and demonstrate proper pursed lip breathing techniques or other breathing techniques.      Long  Term Goals  Verbalizes importance of monitoring SPO2 with pulse oximeter and return demonstration;Maintenance of O2 saturations>88%;Exhibits proper breathing techniques, such as pursed lip breathing or other method taught during program session;Compliance with respiratory medication  Exhibits compliance with exercise, home and travel O2 prescription;Verbalizes importance of monitoring SPO2 with pulse oximeter and return demonstration;Exhibits proper breathing techniques, such as pursed lip breathing or other method taught during program session;Maintenance of O2 saturations>88%      Comments  Patient demonstrates proper use of pulse oximeter and pursed lip breathing during exercise and verbalizes understanding of maintaining her O2 sat >88%.   Patient demonstrates proper pursed lip breathing and progper use of pulse oximeter. She also verbalizes understanding of maintaining O2 sat >88%.        Goals/Expected Outcomes  Patient will continue to meet short and long term goals.   Patient will continue to meet her short and long term goals.          Oxygen Discharge (Final Oxygen Re-Evaluation): Oxygen Re-Evaluation - 07/11/17 0838      Program Oxygen Prescription   Program Oxygen Prescription  None      Home Oxygen   Home Oxygen Device  None    Sleep Oxygen Prescription  None    Home Exercise Oxygen Prescription  None    Home at Rest Exercise Oxygen Prescription  None      Goals/Expected Outcomes   Short Term Goals  To learn and exhibit compliance with exercise, home and travel O2 prescription;To learn and understand importance of monitoring SPO2 with pulse oximeter and demonstrate accurate use of the  pulse oximeter.;To learn and understand importance of maintaining oxygen saturations>88%;To learn and demonstrate proper pursed lip breathing techniques or other breathing techniques.    Long  Term Goals  Exhibits compliance with exercise, home and travel O2 prescription;Verbalizes importance of monitoring SPO2 with pulse oximeter and return demonstration;Exhibits proper breathing techniques, such as pursed lip breathing or other method taught during program session;Maintenance of O2 saturations>88%    Comments  Patient demonstrates proper pursed lip breathing and progper use of pulse oximeter. She also verbalizes understanding of maintaining O2 sat >88%.     Goals/Expected Outcomes  Patient will continue to meet her short and long term goals.        Initial Exercise Prescription: Initial Exercise Prescription - 05/24/17 1400      Date of Initial Exercise RX and Referring Provider   Date  05/24/17    Referring Provider  Dr. Aundra Dubin      Treadmill   MPH  1.1    Grade  0    Minutes  15    METs  1.8      NuStep   Level  2    SPM  77    Minutes  20    METs  1.6      Prescription Details   Frequency (times per week)  3    Duration  Progress to 30 minutes of continuous  aerobic without signs/symptoms of physical distress      Intensity   THRR 40-80% of Max Heartrate  105-122-139    Ratings of Perceived Exertion  11-13    Perceived Dyspnea  0-4      Progression   Progression  Continue progressive overload as per policy without signs/symptoms or physical distress.      Resistance Training   Training Prescription  Yes    Weight  1    Reps  10-15       Perform Capillary Blood Glucose checks as needed.  Exercise Prescription Changes:  Exercise Prescription Changes    Row Name 05/29/17 0800 06/06/17 0800 06/21/17 0700 07/09/17 1500       Response to Exercise   Blood Pressure (Admit)  -  124/72  114/52  100/48    Blood Pressure (Exercise)  -  102/52  122/50  100/50    Blood Pressure (Exit)  -  106/54  100/50  100/48    Heart Rate (Admit)  -  73 bpm  70 bpm  70 bpm    Heart Rate (Exercise)  -  81 bpm  100 bpm  78 bpm    Heart Rate (Exit)  -  77 bpm  72 bpm  79 bpm    Oxygen Saturation (Admit)  -  93 %  98 %  93 %    Oxygen Saturation (Exercise)  -  96 %  93 %  99 %    Oxygen Saturation (Exit)  -  98 %  99 %  98 %    Rating of Perceived Exertion (Exercise)  -  _0 Perceived Dyspnea (Exercise)  -  _1 Duration  -  Progress to 30 minutes of  aerobic without signs/symptoms of physical distress  Progress to 30 minutes of  aerobic without signs/symptoms of physical distress  Progress to 30 minutes of  aerobic without signs/symptoms of physical distress    Intensity  -  THRR New 106-123-139  THRR New 104-122-139  THRR unchanged 104-122-139  Progression   Progression  -  Continue to progress workloads to maintain intensity without signs/symptoms of physical distress.  Continue to progress workloads to maintain intensity without signs/symptoms of physical distress.  Continue to progress workloads to maintain intensity without signs/symptoms of physical distress.      Resistance Training   Training Prescription  Yes  Yes  Yes  Yes     Weight  _0 Reps  10-15  10-15  10-15  10-15      Treadmill   MPH  1.1  1.3  1.4  1.4    Grade  0  0  0  0    Minutes  _1 METs  1.8  1._2 NuStep   Level  _3 SPM  77  67  107  112    Minutes  _4 METs  1.6  1.5  1.7  1.6      Home Exercise Plan   Plans to continue exercise at  Home (comment)  Home (comment)  Home (comment)  Home (comment)    Frequency  Add 2 additional days to program exercise sessions.  Add 2 additional days to program exercise sessions.  Add 2 additional days to program exercise sessions.  Add 2 additional days to program exercise sessions.    Initial Home Exercises Provided  05/28/17  05/28/17  05/28/17  05/28/17       Exercise Comments:  Exercise Comments    Row Name 05/29/17 1287 06/21/17 0741 07/09/17 1510       Exercise Comments  Patient received the take home exercise plan today 05/28/2017. THR was addressed as were safe ways to be active outside of Rehab. Patient demonstrated an understanding. Patient was encouraged to ask any future questions.   Patient is doing well in PR. She states that she feels stronger and has gained more endurance she has started. She has increased in SPMs on the Nustep and has increased her speed on the treadmill as well.  Patient is doing well in PR and is maintaning all of her levels on the equipment. She has even increased her SPMs on the Nustep. We attempted to progress her some on the treadmill to 1.6 but she complained of chest discomfort shortly after. Patient states that she feels that she has increased her endurance and stamina since coming to [facility].         Exercise Goals and Review:  Exercise Goals    Row Name 05/24/17 1335             Exercise Goals   Increase Physical Activity  Yes       Intervention  Provide advice, education, support and counseling about physical activity/exercise needs.;Develop an individualized exercise prescription for aerobic and  resistive training based on initial evaluation findings, risk stratification, comorbidities and participant's personal goals.       Expected Outcomes  Achievement of increased cardiorespiratory fitness and enhanced flexibility, muscular endurance and strength shown through measurements of functional capacity and personal statement of participant.       Increase Strength and Stamina  Yes       Intervention  Provide advice, education, support and counseling about physical activity/exercise needs.;Develop an individualized exercise prescription for aerobic and resistive training based on  initial evaluation findings, risk stratification, comorbidities and participant's personal goals.       Expected Outcomes  Achievement of increased cardiorespiratory fitness and enhanced flexibility, muscular endurance and strength shown through measurements of functional capacity and personal statement of participant.       Able to understand and use rate of perceived exertion (RPE) scale  Yes       Intervention  Provide education and explanation on how to use RPE scale       Expected Outcomes  Short Term: Able to use RPE daily in rehab to express subjective intensity level;Long Term:  Able to use RPE to guide intensity level when exercising independently       Able to understand and use Dyspnea scale  Yes       Intervention  Provide education and explanation on how to use Dyspnea scale       Expected Outcomes  Short Term: Able to use Dyspnea scale daily in rehab to express subjective sense of shortness of breath during exertion;Long Term: Able to use Dyspnea scale to guide intensity level when exercising independently       Knowledge and understanding of Target Heart Rate Range (THRR)  Yes       Intervention  Provide education and explanation of THRR including how the numbers were predicted and where they are located for reference       Expected Outcomes  Short Term: Able to state/look up THRR       Able to check pulse  independently  Yes       Intervention  Provide education and demonstration on how to check pulse in carotid and radial arteries.;Review the importance of being able to check your own pulse for safety during independent exercise       Expected Outcomes  Short Term: Able to explain why pulse checking is important during independent exercise;Long Term: Able to check pulse independently and accurately       Understanding of Exercise Prescription  Yes       Intervention  Provide education, explanation, and written materials on patient's individual exercise prescription       Expected Outcomes  Short Term: Able to explain program exercise prescription;Long Term: Able to explain home exercise prescription to exercise independently          Exercise Goals Re-Evaluation : Exercise Goals Re-Evaluation    Row Name 06/21/17 0740 07/09/17 1508           Exercise Goal Re-Evaluation   Exercise Goals Review  Increase Physical Activity;Increase Strength and Stamina;Knowledge and understanding of Target Heart Rate Range (THRR)  Increase Physical Activity;Increase Strength and Stamina;Knowledge and understanding of Target Heart Rate Range (THRR)      Comments  Patient is doing well in PR. She states that she feels stronger and has gained more endurance she has started. She has increased in SPMs on the Nustep and has increased her speed on the treadmill as well.   Patient is doing well in PR and is maintaning all of her levels on the equipment. She has even increased her SPMs on the Nustep. We attempted to progress her some on the treadmill to 1.6 but she complained of chest discomfort shortly after. Patient states that she feels that she has increased her endurance and stamina since coming to PR.       Expected Outcomes  Patient wishes to gain strength and stamina and to increase breathing   Patient wishes to increase strength and stamina also  to improve breathing and decrease chest pain.          Discharge  Exercise Prescription (Final Exercise Prescription Changes): Exercise Prescription Changes - 07/09/17 1500      Response to Exercise   Blood Pressure (Admit)  100/48    Blood Pressure (Exercise)  100/50    Blood Pressure (Exit)  100/48    Heart Rate (Admit)  70 bpm    Heart Rate (Exercise)  78 bpm    Heart Rate (Exit)  79 bpm    Oxygen Saturation (Admit)  93 %    Oxygen Saturation (Exercise)  99 %    Oxygen Saturation (Exit)  98 %    Rating of Perceived Exertion (Exercise)  10    Perceived Dyspnea (Exercise)  10    Duration  Progress to 30 minutes of  aerobic without signs/symptoms of physical distress    Intensity  THRR unchanged 104-122-139      Progression   Progression  Continue to progress workloads to maintain intensity without signs/symptoms of physical distress.      Resistance Training   Training Prescription  Yes    Weight  1    Reps  10-15      Treadmill   MPH  1.4    Grade  0    Minutes  15    METs  2      NuStep   Level  2    SPM  112    Minutes  20    METs  1.6      Home Exercise Plan   Plans to continue exercise at  Home (comment)    Frequency  Add 2 additional days to program exercise sessions.    Initial Home Exercises Provided  05/28/17       Nutrition:  Target Goals: Understanding of nutrition guidelines, daily intake of sodium <1539m, cholesterol <2063m calories 30% from fat and 7% or less from saturated fats, daily to have 5 or more servings of fruits and vegetables.  Biometrics: Pre Biometrics - 05/24/17 1425      Pre Biometrics   Height  _0  (1.6 m)    Weight  114 lb 10.2 oz (52 kg)    Waist Circumference  30.5 inches    Hip Circumference  33.5 inches    Waist to Hip Ratio  0.91 %    BMI (Calculated)  20.31    Triceps Skinfold  8 mm    % Body Fat  27 %    Grip Strength  41.4 kg    Flexibility  10.33 in    Single Leg Stand  10 seconds        Nutrition Therapy Plan and Nutrition Goals: Nutrition Therapy & Goals - 07/11/17  0843      Nutrition Therapy   RD appointment defered  Yes      Personal Nutrition Goals   Nutrition Goal  Diabetic diet.    Comments  Patient is following a diabetic diet.        Nutrition Discharge: Rate Your Plate Scores: Nutrition Assessments - 05/24/17 1506      MEDFICTS Scores   Pre Score  9       Nutrition Goals Re-Evaluation:   Nutrition Goals Discharge (Final Nutrition Goals Re-Evaluation):   Psychosocial: Target Goals: Acknowledge presence or absence of significant depression and/or stress, maximize coping skills, provide positive support system. Participant is able to verbalize types and ability to use techniques and skills needed for reducing  stress and depression.  Initial Review & Psychosocial Screening: Initial Psych Review & Screening - 05/24/17 1509      Initial Review   Current issues with  None Identified      Family Dynamics   Good Support System?  Yes      Barriers   Psychosocial barriers to participate in program  There are no identifiable barriers or psychosocial needs.      Screening Interventions   Interventions  Encouraged to exercise       Quality of Life Scores: Quality of Life - 05/24/17 1335      Quality of Life Scores   Health/Function Pre  13.17 %    Socioeconomic Pre  26.57 %    Psych/Spiritual Pre  22.93 %    Family Pre  30 %    GLOBAL Pre  20.41 %       PHQ-9: Recent Review Flowsheet Data    Depression screen [facility] 2/9 05/24/2017   Decreased Interest 0   Down, Depressed, Hopeless 0   PHQ - 2 Score 0   Altered sleeping 1   Tired, decreased energy 2   Change in appetite 1   Feeling bad or failure about yourself  0   Trouble concentrating 0   Moving slowly or fidgety/restless 0   Suicidal thoughts 0   PHQ-9 Score 4   Difficult doing work/chores Not difficult at all     Interpretation of Total Score  Total Score Depression Severity:  1-4 = Minimal depression, 5-9 = Mild depression, 10-14 = Moderate depression,  15-19 = Moderately severe depression, 20-27 = Severe depression   Psychosocial Evaluation and Intervention: Psychosocial Evaluation - 05/24/17 1509      Psychosocial Evaluation & Interventions   Interventions  Encouraged to exercise with the program and follow exercise prescription    Continue Psychosocial Services   No Follow up required       Psychosocial Re-Evaluation: Psychosocial Re-Evaluation    [place] Name 06/20/17 1618 07/11/17 8110           Psychosocial Re-Evaluation   Current issues with  None Identified  None Identified      Comments  Patient's initial QOL score was 20.41 and her PHQ-9 score was 4.  -      Expected Outcomes  Patient will have no psychosocial barriers identified at discharge.   Patient will have no psychosocial barriers identified at discharge.       Interventions  Stress management education;Encouraged to attend Pulmonary Rehabilitation for the exercise;Relaxation education  Encouraged to attend Pulmonary Rehabilitation for the exercise;Relaxation education;Stress management education      Continue Psychosocial Services   No Follow up required  No Follow up required         Psychosocial Discharge (Final Psychosocial Re-Evaluation): Psychosocial Re-Evaluation - 07/11/17 3159      Psychosocial Re-Evaluation   Current issues with  None Identified    Expected Outcomes  Patient will have no psychosocial barriers identified at discharge.     Interventions  Encouraged to attend Pulmonary Rehabilitation for the exercise;Relaxation education;Stress management education    Continue Psychosocial Services   No Follow up required        Education: Education Goals: Education classes will be provided on a weekly basis, covering required topics. Participant will state understanding/return demonstration of topics presented.  Learning Barriers/Preferences: Learning Barriers/Preferences - 05/24/17 1457      Learning Barriers/Preferences   Learning Barriers  None     Learning Preferences  Video;Written Material;Pictoral;Computer/Internet       Education Topics: How Lungs Work and Diseases: - Discuss the anatomy of the lungs and diseases that can affect the lungs, such as COPD.   Exercise: -Discuss the importance of exercise, FITT principles of exercise, normal and abnormal responses to exercise, and how to exercise safely.   PULMONARY REHAB OTHER RESPIRATORY from 07/04/2017 in [place]  Date  05/30/17  Educator  DJ  Instruction Review Code  2- Demonstrated Understanding      Environmental Irritants: -Discuss types of environmental irritants and how to limit exposure to environmental irritants.   Meds/Inhalers and oxygen: - Discuss respiratory medications, definition of an inhaler and oxygen, and the proper way to use an inhaler and oxygen.   Energy Saving Techniques: - Discuss methods to conserve energy and decrease shortness of breath when performing activities of daily living.    Bronchial Hygiene / Breathing Techniques: - Discuss breathing mechanics, pursed-lip breathing technique,  proper posture, effective ways to clear airways, and other functional breathing techniques   Cleaning Equipment: - Provides group verbal and written instruction about the health risks of elevated stress, cause of high stress, and healthy ways to reduce stress.   PULMONARY REHAB OTHER RESPIRATORY from 07/04/2017 in [place]  Date  07/04/17  Educator  DWynetta Emery  Instruction Review Code  2- Demonstrated Understanding      Nutrition I: Fats: - Discuss the types of cholesterol, what cholesterol does to the body, and how cholesterol levels can be controlled.   Nutrition II: Labels: -Discuss the different components of food labels and how to read food labels.   Respiratory Infections: - Discuss the signs and symptoms of respiratory infections, ways to prevent respiratory infections, and the  importance of seeking medical treatment when having a respiratory infection.   Stress I: Signs and Symptoms: - Discuss the causes of stress, how stress may lead to anxiety and depression, and ways to limit stress.   Stress II: Relaxation: -Discuss relaxation techniques to limit stress.   Oxygen for Home/Travel: - Discuss how to prepare for travel when on oxygen and proper ways to transport and store oxygen to ensure safety.   Knowledge Questionnaire Score: Knowledge Questionnaire Score - 05/24/17 1500      Knowledge Questionnaire Score   Pre Score  23/24       Core Components/Risk Factors/Patient Goals at Admission: Personal Goals and Risk Factors at Admission - 05/24/17 1507      Core Components/Risk Factors/Patient Goals on Admission    Weight Management  Weight Maintenance    Improve shortness of breath with ADL's  Yes    Intervention  Provide education, individualized exercise plan and daily activity instruction to help decrease symptoms of SOB with activities of daily living.    Expected Outcomes  Short Term: Achieves a reduction of symptoms when performing activities of daily living.    Personal Goal Other  Yes    Personal Goal  Increase strength and stamina, improve breathing and decrease chest discomfort with exertion. Be able to start traveling in their RV again.     Intervention  Attend CR 2 x week and supplement with at home exercise 3 x week.     Expected Outcomes  Reach personal goals.        Core Components/Risk Factors/Patient Goals Review:  Goals and Risk Factor Review    Row Name 06/20/17 1613 07/11/17 0853           Core  Components/Risk Factors/Patient Goals Review   Personal Goals Review  Weight Management/Obesity;Improve shortness of breath with ADL's;Develop more efficient breathing techniques such as purse lipped breathing and diaphragmatic breathing and practicing self-pacing with activity.;Heart Failure Gain weight. Get stronger; be able to travel  in RV again and visit family.  Weight Management/Obesity;Improve shortness of breath with ADL's;Develop more efficient breathing techniques such as purse lipped breathing and diaphragmatic breathing and practicing self-pacing with activity.;Diabetes;Heart Failure Get stronger; be able to travel in RV again to visit family.      Review  Patient has completed 8 sessions gaining 1 lb. She says she feels the program is helping her. She says she still gets SOB but she feels her endurance and strength have improved. She her husband also has noticed an improvement. She says she is able to do more around the house without getting fatigued. She is able to climb stairs without having to stop and rest before she reaches the top. She has not traveled in her RV yet but hopes to in the Spring. Will continue to monitor for progress.   Patient has completed 11 sessions maintaining her weight. She states that she continues to get stronger and is able to do more around the house now without getting fatigued. She says the program is helpinig her meet her goals. She hopes to travel again in her RV in the Spring. Will continue to monitor.       Expected Outcomes  Patient will continue to attend sessiona and complete the program and continue to meet her personal goals.   Patient will continue to attend sessions and complete the program continuing to meet her personal goals.          Core Components/Risk Factors/Patient Goals at Discharge (Final Review):  Goals and Risk Factor Review - 07/11/17 0853      Core Components/Risk Factors/Patient Goals Review   Personal Goals Review  Weight Management/Obesity;Improve shortness of breath with ADL's;Develop more efficient breathing techniques such as purse lipped breathing and diaphragmatic breathing and practicing self-pacing with activity.;Diabetes;Heart Failure Get stronger; be able to travel in RV again to visit family.    Review  Patient has completed 11 sessions maintaining her  weight. She states that she continues to get stronger and is able to do more around the house now without getting fatigued. She says the program is helpinig her meet her goals. She hopes to travel again in her RV in the Spring. Will continue to monitor.     Expected Outcomes  Patient will continue to attend sessions and complete the program continuing to meet her personal goals.        ITP Comments: ITP Comments    Row Name 05/24/17 1502           ITP Comments  Mrs. Adolf has been in the program before as a heart patient. She is a brittle diabetic with a insulin pump. She gets dizzy if overly exerted. We will gradually increase her exercise work loads.           Comments: ITP 30 Day REVIEW Pt is making expected progress toward pulmonary rehab goals after completing 11 sessions. Recommend continued exercise, life style modification, education, and utilization of breathing techniques to increase stamina and strength and decrease shortness of breath with exertion.

## 2017-07-14 ENCOUNTER — Other Ambulatory Visit: Payer: Self-pay | Admitting: Cardiology

## 2017-07-15 ENCOUNTER — Other Ambulatory Visit (HOSPITAL_COMMUNITY): Payer: Self-pay | Admitting: Internal Medicine

## 2017-07-16 ENCOUNTER — Encounter (HOSPITAL_COMMUNITY): Payer: Medicare Other

## 2017-07-18 ENCOUNTER — Encounter (HOSPITAL_COMMUNITY)
Admission: RE | Admit: 2017-07-18 | Discharge: 2017-07-18 | Disposition: A | Discharge status: Home / Self Care | Payer: Medicare Other | Source: Ambulatory Visit | Attending: Cardiology | Admitting: Cardiology

## 2017-07-18 DIAGNOSIS — I5032 Chronic diastolic (congestive) heart failure: Secondary | ICD-10-CM | POA: Diagnosis not present

## 2017-07-18 NOTE — Progress Notes (Signed)
Daily Session Note  Patient Details  Name: ZAINAH STEVEN MRN: [id number] Date of Birth: 1951-04-11 Referring Provider:     PULMONARY REHAB OTHER RESP ORIENTATION from 05/24/2017 in [place]  Referring Provider  Dr. Aundra Dubin      Encounter Date: 07/18/2017  Check In: Session Check In - 07/18/17 1058      Check-In   Location  [facility]    Staff Present  Chrysten Woulfe Angelina Pih, MS, EP, [facility], Exercise Physiologist;Gregory Luther Parody, BS, EP, Exercise Physiologist    Supervising physician immediately available to respond to emergencies  See telemetry face sheet for immediately available MD    Medication changes reported      No    Fall or balance concerns reported     No    Tobacco Cessation  No Change    Warm-up and Cool-down  Performed as group-led instruction    Resistance Training Performed  Yes    VAD Patient?  No      Pain Assessment   Currently in Pain?  No/denies    Pain Score  0-No pain    Multiple Pain Sites  No       Capillary Blood Glucose: No results found. However, due to the size of the patient record, not all encounters were searched. Please check Results Review for a complete set of results.    Social History   Tobacco Use  Smoking Status Never Smoker  Smokeless Tobacco Never Used    Goals Met:  Proper associated with RPD/PD & O2 Sat Exercise tolerated well No report of cardiac concerns or symptoms Strength training completed today  Goals Unmet:  Not Applicable  Comments: Check out: 11:45   Dr. Sinda Du is Medical Director for [facility].

## 2017-07-23 ENCOUNTER — Encounter (HOSPITAL_COMMUNITY): Payer: Medicare Other

## 2017-07-25 ENCOUNTER — Encounter (HOSPITAL_COMMUNITY): Payer: Medicare Other

## 2017-07-26 ENCOUNTER — Other Ambulatory Visit: Payer: Self-pay | Admitting: Cardiology

## 2017-07-30 ENCOUNTER — Encounter (HOSPITAL_COMMUNITY)
Admission: RE | Admit: 2017-07-30 | Discharge: 2017-07-30 | Disposition: A | Discharge status: Home / Self Care | Payer: Medicare Other | Source: Ambulatory Visit | Attending: Cardiology | Admitting: Cardiology

## 2017-07-30 DIAGNOSIS — I5032 Chronic diastolic (congestive) heart failure: Secondary | ICD-10-CM | POA: Insufficient documentation

## 2017-07-30 NOTE — Progress Notes (Signed)
Daily Session Note  Patient Details  Name: [name] [name] MRN: [id number] Date of Birth: 05/04/1951 Referring Provider:     PULMONARY REHAB OTHER RESP ORIENTATION from 05/24/2017 in [place] [facility]  Referring Provider  Dr. Mclean      Encounter Date: 07/30/2017  Check In: Session Check In - 07/30/17 1340      Check-In   Location  [facility]    Staff Present  Erasmo Vertz, BS, EP, Exercise Physiologist    Supervising physician immediately available to respond to emergencies  See telemetry face sheet for immediately available MD    Medication changes reported      No    Fall or balance concerns reported     No    Warm-up and Cool-down  Performed as group-led instruction    Resistance Training Performed  Yes    VAD Patient?  No      Pain Assessment   Currently in Pain?  No/denies    Pain Score  0-No pain    Multiple Pain Sites  No       Capillary Blood Glucose: No results found. However, due to the size of the patient record, not all encounters were searched. Please check Results Review for a complete set of results.    Social History   Tobacco Use  Smoking Status Never Smoker  Smokeless Tobacco Never Used    Goals Met:  Independence with exercise equipment Improved SOB with ADL's Using PLB without cueing & demonstrates good technique Exercise tolerated well No report of cardiac concerns or symptoms Strength training completed today  Goals Unmet:  Not Applicable  Comments: Check out 230   Dr. Edward Hawkins is Medical Director for [name]. 

## 2017-08-01 ENCOUNTER — Encounter (HOSPITAL_COMMUNITY)
Admission: RE | Admit: 2017-08-01 | Discharge: 2017-08-01 | Disposition: A | Discharge status: Home / Self Care | Payer: Medicare Other | Source: Ambulatory Visit | Attending: Cardiology | Admitting: Cardiology

## 2017-08-01 DIAGNOSIS — I5032 Chronic diastolic (congestive) heart failure: Secondary | ICD-10-CM

## 2017-08-01 NOTE — Progress Notes (Signed)
Daily Session Note  Patient Details  Name: [name] [name] MRN: [id number] Date of Birth: 01/18/1951 Referring Provider:     PULMONARY REHAB OTHER RESP ORIENTATION from 05/24/2017 in [place]  Referring Provider  Dr. Aundra Dubin      Encounter Date: 08/01/2017  Check In: Session Check In - 08/01/17 1048      Check-In   Location  [facility]    Staff Present  Suzanne Boron, BS, EP, Exercise Physiologist;Debra Wynetta Emery, RN, BSN    Supervising physician immediately available to respond to emergencies  See telemetry face sheet for immediately available MD    Medication changes reported      No    Fall or balance concerns reported     No    Warm-up and Cool-down  Performed as group-led instruction    Resistance Training Performed  Yes    VAD Patient?  No      Pain Assessment   Currently in Pain?  No/denies    Pain Score  0-No pain    Multiple Pain Sites  No       Capillary Blood Glucose: No results found. However, due to the size of the patient record, not all encounters were searched. Please check Results Review for a complete set of results.    Social History   Tobacco Use  Smoking Status Never Smoker  Smokeless Tobacco Never Used    Goals Met:  Independence with exercise equipment Improved SOB with ADL's Using PLB without cueing & demonstrates good technique Exercise tolerated well No report of cardiac concerns or symptoms Strength training completed today  Goals Unmet:  Not Applicable  Comments: Check out 1145   Dr. Sinda Du is Medical Director for [facility].

## 2017-08-06 ENCOUNTER — Encounter (HOSPITAL_COMMUNITY)
Admission: RE | Admit: 2017-08-06 | Discharge: 2017-08-06 | Disposition: A | Discharge status: Home / Self Care | Payer: Medicare Other | Source: Ambulatory Visit | Attending: Cardiology | Admitting: Cardiology

## 2017-08-06 DIAGNOSIS — I5032 Chronic diastolic (congestive) heart failure: Secondary | ICD-10-CM | POA: Diagnosis not present

## 2017-08-06 NOTE — Progress Notes (Signed)
Daily Session Note  Patient Details  Name: [name] [name] MRN: [id number] Date of Birth: 30-Mar-1951 Referring Provider:     PULMONARY REHAB OTHER RESP ORIENTATION from 05/24/2017 in [place]  Referring Provider  Dr. Aundra Dubin      Encounter Date: 08/06/2017  Check In: Session Check In - 08/06/17 1104      Check-In   Location  [facility]    Staff Present  Suzanne Boron, BS, EP, Exercise Physiologist;Diane Coad, MS, EP, [facility], Exercise Physiologist    Supervising physician immediately available to respond to emergencies  See telemetry face sheet for immediately available MD    Medication changes reported      No    Fall or balance concerns reported     No    Warm-up and Cool-down  Performed as group-led instruction    Resistance Training Performed  Yes    VAD Patient?  No      Pain Assessment   Currently in Pain?  No/denies    Pain Score  0-No pain    Multiple Pain Sites  No       Capillary Blood Glucose: No results found. However, due to the size of the patient record, not all encounters were searched. Please check Results Review for a complete set of results.  Exercise Prescription Changes - 08/05/17 1400      Response to Exercise   Blood Pressure (Admit)  130/56    Blood Pressure (Exercise)  110/50    Blood Pressure (Exit)  108/64    Heart Rate (Admit)  66 bpm    Heart Rate (Exercise)  76 bpm    Heart Rate (Exit)  73 bpm    Oxygen Saturation (Admit)  91 %    Oxygen Saturation (Exercise)  92 %    Oxygen Saturation (Exit)  95 %    Rating of Perceived Exertion (Exercise)  10    Perceived Dyspnea (Exercise)  10    Duration  Progress to 30 minutes of  aerobic without signs/symptoms of physical distress    Intensity  THRR New [id number]      Progression   Progression  Continue to progress workloads to maintain intensity without signs/symptoms of physical distress.      Resistance Training   Training Prescription  Yes    Weight  1    Reps  10-15      Treadmill   MPH  1.6    Grade  0    Minutes  15    METs  2.2      NuStep   Level  2    SPM  105    Minutes  20    METs  1.6      Home Exercise Plan   Plans to continue exercise at  Home (comment)    Frequency  Add 2 additional days to program exercise sessions.    Initial Home Exercises Provided  05/28/17       Social History   Tobacco Use  Smoking Status Never Smoker  Smokeless Tobacco Never Used    Goals Met:  Independence with exercise equipment Improved SOB with ADL's Using PLB without cueing & demonstrates good technique Exercise tolerated well No report of cardiac concerns or symptoms Strength training completed today  Goals Unmet:  Not Applicable  Comments: Check out 1145   Dr. Sinda Du is Medical Director for [facility].

## 2017-08-07 NOTE — Progress Notes (Signed)
Pulmonary Individual Treatment Plan  Patient Details  Name: [name] [name] MRN: [id number] Date of Birth: 10-Oct-1950 Referring Provider:     PULMONARY REHAB OTHER RESP ORIENTATION from 05/24/2017 in [place]  Referring Provider  Dr. Aundra [name]      Initial Encounter Date:    [place] from 05/24/2017 in [place]  Date  05/24/17  Referring Provider  Dr. Aundra [name]      Visit Diagnosis: Diastolic CHF, chronic ([place])  Patient's Home Medications on Admission:   Current Outpatient Medications:  .  ALPRAZolam (XANAX) 0.5 MG tablet, Take 0.5 mg by mouth at bedtime. May take additional dose daily for anxiety, Disp: , Rfl:  .  amLODipine (NORVASC) 5 MG tablet, Take 0.5 tablets (2.5 mg total) by mouth daily., Disp: 45 tablet, Rfl: 3 .  amLODipine (NORVASC) 5 MG tablet, Take 0.5 tablets (2.5 mg total) by mouth daily., Disp: 15 tablet, Rfl: 3 .  APIDRA 100 UNIT/ML injection, Inject 46 Units into the skin as directed. INFUSE THROUGH INSULIN PUMP UTD::[place] 46.15 units daily, Bolus depends on carb intake, Disp: , Rfl: 2 .  aspirin EC 81 MG tablet, Take 81 mg by mouth at bedtime., Disp: , Rfl:  .  azithromycin (ZITHROMAX) 500 MG tablet, 1 tablet by mouth one hour prior to dental appointment/procedure, Disp: 2 tablet, Rfl: 0 .  Calcium Carbonate-Vitamin D (RA CALCIUM PLUS VITAMIN D) 600-400 MG-UNIT per tablet, Take 1 tablet by mouth daily. , Disp: , Rfl:  .  Cholecalciferol 2000 UNITS TABS, Take 1 tablet by mouth every morning. , Disp: , Rfl:  .  clobetasol (OLUX) 0.05 % topical foam, Apply 1 application topically daily., Disp: , Rfl: 0 .  Coenzyme Q10 (CO Q 10) 100 MG CAPS, Take 1 capsule by mouth at bedtime. , Disp: , Rfl:  .  colchicine 0.6 MG tablet, Take 0.6 mg by mouth at bedtime. And,Take 1 tablet at onset and repeate in 1 hour and then 1 daily x 2 (FOR FLARES) , Disp: , Rfl:  .  docusate sodium (COLACE) 100 MG capsule,  Take 100 mg by mouth at bedtime., Disp: , Rfl:  .  Fe Fum-FA-B Cmp-C-Zn-Mg-Mn-Cu (HEMATINIC PLUS COMPLEX) 106-1 MG TABS, Take 1 tablet by mouth every Monday, Wednesday, and Friday. Take 1 tablet oraly three times a week or as directed daily with with a Vitamin C tablet, Disp: 90 each, Rfl: 0 .  glucagon (GLUCAGON EMERGENCY) 1 MG injection, Inject 1 mg into the vein once as needed (for severe reaction). Reported on 12/05/2015, Disp: , Rfl:  .  isosorbide mononitrate (IMDUR) 120 MG 24 hr tablet, Take 120 mg by mouth daily., Disp: , Rfl:  .  isosorbide mononitrate (IMDUR) 60 MG 24 hr tablet, Take 2 tablets (120 mg total) by mouth daily., Disp: 60 tablet, Rfl: 3 .  latanoprost (XALATAN) 0.005 % ophthalmic solution, Place 1 drop into both eyes at bedtime., Disp: , Rfl:  .  Magnesium Oxide 250 MG TABS, Take 250 mg by mouth at bedtime. , Disp: , Rfl:  .  metolazone (ZAROXOLYN) 2.5 MG tablet, Take 2.5 mg (1 tab) once as directed by CHF clinic. Call before taking [phone], Disp: 3 tablet, Rfl: 0 .  metoprolol tartrate (LOPRESSOR) 50 MG tablet, TAKE 1 AND 1/2 TABLETS(75 MG) BY MOUTH TWICE DAILY, Disp: 270 tablet, Rfl: 0 .  Multiple Vitamins-Minerals (MULTIVITAMIN WITH MINERALS) tablet, Take 1 tablet by mouth every morning. , Disp: , Rfl:  .  nitroGLYCERIN (NITROLINGUAL) 0.4 MG/SPRAY spray, Place 1 spray under the tongue every 5 (five) minutes as needed for chest pain., Disp: 12 g, Rfl: 1 .  pantoprazole (PROTONIX) 40 MG tablet, Take 1 tablet (40 mg total) by mouth daily., Disp: 30 tablet, Rfl: 11 .  Polyethyl Glycol-Propyl Glycol (SYSTANE) 0.4-0.3 % SOLN, Apply 1 drop to eye daily as needed (dry eyes)., Disp: , Rfl:  .  potassium chloride (K-DUR,KLOR-CON) 10 MEQ tablet, TAKE 6 CAPSULES(60 MEQ) BY MOUTH TWICE DAILY, Disp: 360 tablet, Rfl: 6 .  potassium chloride (MICRO-K) 10 MEQ CR capsule, Take 4 capsules (40 mEq total) by mouth 2 (two) times daily., Disp: 240 capsule, Rfl: 3 .  PRALUENT 150 MG/ML SOPN,  INJECT 1 PEN INTO THE SKIN EVERY 14 (FOURTEEN) DAYS., Disp: 2 pen, Rfl: 3 .  pravastatin (PRAVACHOL) 10 MG tablet, TAKE 1 TABLET BY MOUTH ONCE DAILY, Disp: 90 tablet, Rfl: 3 .  spironolactone (ALDACTONE) 25 MG tablet, Take 0.5 tablets (12.5 mg total) by mouth daily., Disp: 15 tablet, Rfl: 3 .  ticagrelor (BRILINTA) 60 MG TABS tablet, TAKE 1 TABLET(60 MG) BY MOUTH TWICE DAILY, Disp: 180 tablet, Rfl: 2 .  torsemide (DEMADEX) 20 MG tablet, Take 80 mg (4 tabs) in AM, then 60 mg (3 tabs) in PM, Disp: 210 tablet, Rfl: 6 .  triamcinolone cream (KENALOG) 0.5 %, Apply 1 application topically daily as needed (inflamation). , Disp: , Rfl:  .  ULORIC 40 MG tablet, Take 40 mg by mouth at bedtime. , Disp: , Rfl: 11 .  vitamin C (ASCORBIC ACID) 500 MG tablet, Take 500 mg by mouth 3 (three) times a week., Disp: , Rfl:   Past Medical History: Past Medical History:  Diagnosis Date  . Anxiety   . Aortic stenosis    a. bicuspid aortic valve; mean gradient of 20 mmHg in 2/10; b. s/p bioprosth AVR (19-mm Edwards pericardial valve) with a redo coronary artery bypass graft procedure in 07/2009; postoperative Dressler's syndrome    . Arthritis   . Carcinoma, renal cell ([place]) 05/2007   Laparoscopic right nephrectomy  . Chronic diastolic CHF (congestive heart failure) ([place])    a. 9.2014 EchoP EF 60-65%, no rwma, bioprosth AVR, mean gradient of 71mHg, mild to mod MR, PASP 534mg.  . CKD (chronic kidney disease), stage II   . Colitis, ischemic ([facility]  . Coronary artery disease    a. initial CABG with SVG-RCA in 12/05. b. redo SVG-RCA and bioprosthetic AVR in 1/11. d. 9/14 - PCI with DES to distal LM/proximal LAD was done rather than bypass as she would have been a poor candidate for redo sternotomy. d. S/p DES to prox LM 05/2014.  . Diabetes mellitus 03/2010    TYPE II: Hemoglobin A1c of 7.4 in  03/2010; 8.4 in 06/2010; treated with insulin pump  . Duodenal ulcer    Remote; H. pylori positive  . Gastroparesis due  to DM ([facility]/04/2012  . Genetic testing 01/14/2017   [name] underwent genetic counseling and testing for hereditary cancer syndromes on 01/01/2017. Her results were negative for mutations in all 83 genes analyzed by [facility]'s 83-gene Common Hereditary Cancers Panel. Genes analyzed include: ALK, APC, ATM, AXIN2, BAP1, BARD1, BLM, BMPR1A, BRCA1, BRCA2, BRIP1, CASR, CDC73, CDH1, CDK4, CDKN1B, CDKN1C, CDKN2A, CEBPA, CHEK2, CTNNA1, DICER1, DIS3L2,   . Hodgkin's disease ([facility]   Mantle radiation therapy  . Hyperlipidemia   . Hyperplastic gastric polyp 06/23/2012  . Hypertension   . Iron deficiency anemia  a. 03/2013 EGD: essentially normal. Possible slow GIB.  . Migraines   . NASH (nonalcoholic steatohepatitis) 1999   -biopsy in 1999  . OSA on CPAP 04/24/2017   Moderate with AHI 22/hr now on CPAP at 18cm H2o  . Osteopenia     hip on DEXA in October 2007.  . Osteoporosis   . PAF (paroxysmal atrial fibrillation) ([place])    a.  h/o brief episodes noted on ppm interrogation. b. given GI bleeding and need to be on both ASA 81 and Brilinta, risks likely outweigh benefits of anticoagulation.   . Small bowel obstruction ([facility])    Recurrent; resolved after resection of a lipoma  . Syncope    a. H/o recurrent syncope with pauses on loop recorder s/p Medtronic pacemaker 07/2012.    Tobacco Use: Social History   Tobacco Use  Smoking Status Never Smoker  Smokeless Tobacco Never Used    Labs: Recent Review Flowsheet Data    Labs for ITP Cardiac and Pulmonary Rehab Latest Ref Rng & Units 11/04/2015 04/27/2016 04/19/2017 04/19/2017 05/15/2017   Cholestrol 0 - 200 mg/dL 115(L) 125 - - 80   LDLCALC 0 - 99 mg/dL 33 42 - - 16   LDLDIRECT mg/dL - - - - -   HDL >40 mg/dL 41(L) 43(L) - - 40(L)   Trlycerides <150 mg/dL 207(H) 199(H) - - 119   Hemoglobin A1c 4.8 - 5.6 % - - - - -   PHART 7.350 - 7.450 - - - - -   PCO2ART 35.0 - 45.0 mmHg - - - - -   HCO3 20.0 - 28.0 mmol/L - - 28.1(H) 27.3 -   TCO2  22 - 32 mmol/L - - 29 29 -   ACIDBASEDEF 0.0 - 2.0 mmol/L - - - - -   O2SAT % - - 78.0 75.0 -      Capillary Blood Glucose: Lab Results  Component Value Date   GLUCAP 198 (H) 04/19/2017   GLUCAP 144 (H) 04/19/2017   GLUCAP 152 (H) 04/19/2017   GLUCAP 164 (H) 04/19/2017   GLUCAP 170 (H) 04/19/2017     Pulmonary Assessment Scores:   Pulmonary Function Assessment:   Exercise Target Goals:    Exercise Program Goal: Individual exercise prescription set using results from initial 6 min walk test and THRR while considering  patient's activity barriers and safety.   Exercise Prescription Goal: Initial exercise prescription builds to 30-45 minutes a day of aerobic activity, 2-3 days per week.  Home exercise guidelines will be given to patient during program as part of exercise prescription that the participant will acknowledge.  Activity Barriers & Risk Stratification: Activity Barriers & Cardiac Risk Stratification - 05/24/17 1334      Activity Barriers & Cardiac Risk Stratification   Activity Barriers  Deconditioning;Muscular Weakness;Shortness of Breath;Balance Concerns;History of Falls    Cardiac Risk Stratification  High       6 Minute Walk: 6 Minute Walk    Row Name 05/24/17 1333         6 Minute Walk   Phase  Initial     Distance  1050 feet     Distance % Change  0 %     Distance Feet Change  0 ft     Walk Time  6 minutes     # of Rest Breaks  0     MPH  1.98     METS  2.52     RPE  15  Perceived Dyspnea   13     VO2 Peak  9.79     Symptoms  No     Resting HR  71 bpm     Resting BP  102/52     Resting Oxygen Saturation   96 %     Exercise Oxygen Saturation  during 6 min walk  95 %     Max Ex. HR  77 bpm     Max Ex. BP  120/60     2 Minute Post BP  100/56        Oxygen Initial Assessment: Oxygen Initial Assessment - 05/24/17 1504      Home Oxygen   Home Oxygen Device  None    Sleep Oxygen Prescription  None    Home Exercise Oxygen  Prescription  None    Home at Rest Exercise Oxygen Prescription  None      Initial 6 min Walk   Oxygen Used  None      Program Oxygen Prescription   Program Oxygen Prescription  None       Oxygen Re-Evaluation: Oxygen Re-Evaluation    Row Name 06/20/17 1611 07/11/17 0838 08/07/17 1245         Program Oxygen Prescription   Program Oxygen Prescription  None  None  None       Home Oxygen   Home Oxygen Device  None  None  None     Sleep Oxygen Prescription  None  None  None     Home Exercise Oxygen Prescription  None  None  None     Home at Rest Exercise Oxygen Prescription  None  None  None       Goals/Expected Outcomes   Short Term Goals  To learn and understand importance of monitoring SPO2 with pulse oximeter and demonstrate accurate use of the pulse oximeter.;To learn and understand importance of maintaining oxygen saturations>88%;To learn and demonstrate proper pursed lip breathing techniques or other breathing techniques.  To learn and exhibit compliance with exercise, home and travel O2 prescription;To learn and understand importance of monitoring SPO2 with pulse oximeter and demonstrate accurate use of the pulse oximeter.;To learn and understand importance of maintaining oxygen saturations>88%;To learn and demonstrate proper pursed lip breathing techniques or other breathing techniques.  To learn and understand importance of monitoring SPO2 with pulse oximeter and demonstrate accurate use of the pulse oximeter.;To learn and understand importance of maintaining oxygen saturations>88%;To learn and demonstrate proper pursed lip breathing techniques or other breathing techniques.     Long  Term Goals  Verbalizes importance of monitoring SPO2 with pulse oximeter and return demonstration;Maintenance of O2 saturations>88%;Exhibits proper breathing techniques, such as pursed lip breathing or other method taught during program session;Compliance with respiratory medication  Exhibits  compliance with exercise, home and travel O2 prescription;Verbalizes importance of monitoring SPO2 with pulse oximeter and return demonstration;Exhibits proper breathing techniques, such as pursed lip breathing or other method taught during program session;Maintenance of O2 saturations>88%  Verbalizes importance of monitoring SPO2 with pulse oximeter and return demonstration;Exhibits proper breathing techniques, such as pursed lip breathing or other method taught during program session;Maintenance of O2 saturations>88%     Comments  Patient demonstrates proper use of pulse oximeter and pursed lip breathing during exercise and verbalizes understanding of maintaining her O2 sat >88%.   Patient demonstrates proper pursed lip breathing and progper use of pulse oximeter. She also verbalizes understanding of maintaining O2 sat >88%.   Patient demonstrates proper pursed lip breathing and  progper use of pulse oximeter. She also verbalizes understanding of maintaining O2 sat >88%.      Goals/Expected Outcomes  Patient will continue to meet short and long term goals.   Patient will continue to meet her short and long term goals.   Patient will continue to meet her short and long term goals.         Oxygen Discharge (Final Oxygen Re-Evaluation): Oxygen Re-Evaluation - 08/07/17 1245      Program Oxygen Prescription   Program Oxygen Prescription  None      Home Oxygen   Home Oxygen Device  None    Sleep Oxygen Prescription  None    Home Exercise Oxygen Prescription  None    Home at Rest Exercise Oxygen Prescription  None      Goals/Expected Outcomes   Short Term Goals  To learn and understand importance of monitoring SPO2 with pulse oximeter and demonstrate accurate use of the pulse oximeter.;To learn and understand importance of maintaining oxygen saturations>88%;To learn and demonstrate proper pursed lip breathing techniques or other breathing techniques.    Long  Term Goals  Verbalizes importance of  monitoring SPO2 with pulse oximeter and return demonstration;Exhibits proper breathing techniques, such as pursed lip breathing or other method taught during program session;Maintenance of O2 saturations>88%    Comments  Patient demonstrates proper pursed lip breathing and progper use of pulse oximeter. She also verbalizes understanding of maintaining O2 sat >88%.     Goals/Expected Outcomes  Patient will continue to meet her short and long term goals.        Initial Exercise Prescription: Initial Exercise Prescription - 05/24/17 1400      Date of Initial Exercise RX and Referring Provider   Date  05/24/17    Referring Provider  Dr. Aundra [name]      Treadmill   MPH  1.1    Grade  0    Minutes  15    METs  1.8      NuStep   Level  2    SPM  77    Minutes  20    METs  1.6      Prescription Details   Frequency (times per week)  3    Duration  Progress to 30 minutes of continuous aerobic without signs/symptoms of physical distress      Intensity   THRR 40-80% of Max Heartrate  105-122-139    Ratings of Perceived Exertion  11-13    Perceived Dyspnea  0-4      Progression   Progression  Continue progressive overload as per policy without signs/symptoms or physical distress.      Resistance Training   Training Prescription  Yes    Weight  1    Reps  10-15       Perform Capillary Blood Glucose checks as needed.  Exercise Prescription Changes:  Exercise Prescription Changes    Row Name 05/29/17 0800 06/06/17 0800 06/21/17 0700 07/09/17 1500 07/24/17 1400     Response to Exercise   Blood Pressure (Admit)  -  124/72  114/52  100/48  114/60   Blood Pressure (Exercise)  -  102/52  122/50  100/50  112/50   Blood Pressure (Exit)  -  106/54  100/50  100/48  110/60   Heart Rate (Admit)  -  73 bpm  70 bpm  70 bpm  80 bpm   Heart Rate (Exercise)  -  81 bpm  100 bpm  78 bpm  86 bpm   Heart Rate (Exit)  -  77 bpm  72 bpm  79 bpm  82 bpm   Oxygen Saturation (Admit)  -  93 %  98 %  93 %   99 %   Oxygen Saturation (Exercise)  -  96 %  93 %  99 %  99 %   Oxygen Saturation (Exit)  -  98 %  99 %  98 %  93 %   Rating of Perceived Exertion (Exercise)  -  10  11  10  9    Perceived Dyspnea (Exercise)  -  10  11  10  11    Duration  -  Progress to 30 minutes of  aerobic without signs/symptoms of physical distress  Progress to 30 minutes of  aerobic without signs/symptoms of physical distress  Progress to 30 minutes of  aerobic without signs/symptoms of physical distress  Progress to 30 minutes of  aerobic without signs/symptoms of physical distress   Intensity  -  THRR New 106-123-139  THRR New 104-122-139  THRR unchanged 104-122-139  THRR unchanged 110-126-141     Progression   Progression  -  Continue to progress workloads to maintain intensity without signs/symptoms of physical distress.  Continue to progress workloads to maintain intensity without signs/symptoms of physical distress.  Continue to progress workloads to maintain intensity without signs/symptoms of physical distress.  Continue to progress workloads to maintain intensity without signs/symptoms of physical distress.     Resistance Training   Training Prescription  Yes  Yes  Yes  Yes  Yes   Weight  1  1  1  1  1    Reps  10-15  10-15  10-15  10-15  10-15     Treadmill   MPH  1.1  1.3  1.4  1.4  1.5   Grade  0  0  0  0  0   Minutes  15  15  15  15  15    METs  1.8  1.9  2  2   2.1     NuStep   Level  2  2  2  2  2    SPM  77  67  107  112  109   Minutes  20  20  20  20  20    METs  1.6  1.5  1.7  1.6  1.6     Home Exercise Plan   Plans to continue exercise at  Home (comment)  Home (comment)  Home (comment)  Home (comment)  Home (comment)   Frequency  Add 2 additional days to program exercise sessions.  Add 2 additional days to program exercise sessions.  Add 2 additional days to program exercise sessions.  Add 2 additional days to program exercise sessions.  Add 2 additional days to program exercise sessions.   Initial  Home Exercises Provided  05/28/17  05/28/17  05/28/17  05/28/17  05/28/17   Row Name 08/05/17 1400             Response to Exercise   Blood Pressure (Admit)  130/56       Blood Pressure (Exercise)  110/50       Blood Pressure (Exit)  108/64       Heart Rate (Admit)  66 bpm       Heart Rate (Exercise)  76 bpm       Heart Rate (Exit)  73 bpm       Oxygen Saturation (Admit)  91 %       Oxygen Saturation (Exercise)  92 %       Oxygen Saturation (Exit)  95 %       Rating of Perceived Exertion (Exercise)  10       Perceived Dyspnea (Exercise)  10       Duration  Progress to 30 minutes of  aerobic without signs/symptoms of physical distress       Intensity  THRR New 102-120-138         Progression   Progression  Continue to progress workloads to maintain intensity without signs/symptoms of physical distress.         Resistance Training   Training Prescription  Yes       Weight  1       Reps  10-15         Treadmill   MPH  1.6       Grade  0       Minutes  15       METs  2.2         NuStep   Level  2       SPM  105       Minutes  20       METs  1.6         Home Exercise Plan   Plans to continue exercise at  Home (comment)       Frequency  Add 2 additional days to program exercise sessions.       Initial Home Exercises Provided  05/28/17          Exercise Comments:  Exercise Comments    Row Name 05/29/17 0932 06/21/17 0741 07/09/17 1510 07/24/17 1414 08/05/17 1418   Exercise Comments  Patient received the take home exercise plan today 05/28/2017. THR was addressed as were safe ways to be active outside of Rehab. Patient demonstrated an understanding. Patient was encouraged to ask any future questions.   Patient is doing well in PR. She states that she feels stronger and has gained more endurance she has started. She has increased in SPMs on the Nustep and has increased her speed on the treadmill as well.  Patient is doing well in PR and is maintaning all of her levels on  the equipment. She has even increased her SPMs on the Nustep. We attempted to progress her some on the treadmill to 1.6 but she complained of chest discomfort shortly after. Patient states that she feels that she has increased her endurance and stamina since coming to PR.   Patient is doing well in PR. She has increased her speed on the treadmill as well as maintained her SPMs and METS on the Nustep machine. She states that this is helping her gain strength and increase endurance throughout the day.   Patient is doing well in PR. She has increased her speed on the treadmill and has maintained her level and her SPMs on the nustep machine. She states that she is feeling stronger and is able to do more activities around the house and with family.       Exercise Goals and Review:  Exercise Goals    Row Name 05/24/17 1335             Exercise Goals   Increase Physical Activity  Yes       Intervention  Provide advice, education, support and counseling about physical activity/exercise needs.;Develop an individualized exercise prescription for aerobic and resistive  training based on initial evaluation findings, risk stratification, comorbidities and participant's personal goals.       Expected Outcomes  Achievement of increased cardiorespiratory fitness and enhanced flexibility, muscular endurance and strength shown through measurements of functional capacity and personal statement of participant.       Increase Strength and Stamina  Yes       Intervention  Provide advice, education, support and counseling about physical activity/exercise needs.;Develop an individualized exercise prescription for aerobic and resistive training based on initial evaluation findings, risk stratification, comorbidities and participant's personal goals.       Expected Outcomes  Achievement of increased cardiorespiratory fitness and enhanced flexibility, muscular endurance and strength shown through measurements of functional  capacity and personal statement of participant.       Able to understand and use rate of perceived exertion (RPE) scale  Yes       Intervention  Provide education and explanation on how to use RPE scale       Expected Outcomes  Short Term: Able to use RPE daily in rehab to express subjective intensity level;Long Term:  Able to use RPE to guide intensity level when exercising independently       Able to understand and use Dyspnea scale  Yes       Intervention  Provide education and explanation on how to use Dyspnea scale       Expected Outcomes  Short Term: Able to use Dyspnea scale daily in rehab to express subjective sense of shortness of breath during exertion;Long Term: Able to use Dyspnea scale to guide intensity level when exercising independently       Knowledge and understanding of Target Heart Rate Range (THRR)  Yes       Intervention  Provide education and explanation of THRR including how the numbers were predicted and where they are located for reference       Expected Outcomes  Short Term: Able to state/look up THRR       Able to check pulse independently  Yes       Intervention  Provide education and demonstration on how to check pulse in carotid and radial arteries.;Review the importance of being able to check your own pulse for safety during independent exercise       Expected Outcomes  Short Term: Able to explain why pulse checking is important during independent exercise;Long Term: Able to check pulse independently and accurately       Understanding of Exercise Prescription  Yes       Intervention  Provide education, explanation, and written materials on patient's individual exercise prescription       Expected Outcomes  Short Term: Able to explain program exercise prescription;Long Term: Able to explain home exercise prescription to exercise independently          Exercise Goals Re-Evaluation : Exercise Goals Re-Evaluation    Row Name 06/21/17 0740 07/09/17 1508 08/05/17 1416          Exercise Goal Re-Evaluation   Exercise Goals Review  Increase Physical Activity;Increase Strength and Stamina;Knowledge and understanding of Target Heart Rate Range (THRR)  Increase Physical Activity;Increase Strength and Stamina;Knowledge and understanding of Target Heart Rate Range (THRR)  Increase Physical Activity;Increase Strength and Stamina;Knowledge and understanding of Target Heart Rate Range (THRR)     Comments  Patient is doing well in PR. She states that she feels stronger and has gained more endurance she has started. She has increased in SPMs on the Nustep and has  increased her speed on the treadmill as well.   Patient is doing well in PR and is maintaning all of her levels on the equipment. She has even increased her SPMs on the Nustep. We attempted to progress her some on the treadmill to 1.6 but she complained of chest discomfort shortly after. Patient states that she feels that she has increased her endurance and stamina since coming to PR.   Patient is doing well in [place]. She has increased her speed on the treadmill and has maintained her level and her SPMs on the nustep machine. She states that she is feeling stronger and is able to do more activities around the house and with family.      Expected Outcomes  Patient wishes to gain strength and stamina and to increase breathing   Patient wishes to increase strength and stamina also to improve breathing and decrease chest pain.   Patient wishes to increase strength and stamina and to be able to improve breathing. Also to decrease the appearance of chest pain.         Discharge Exercise Prescription (Final Exercise Prescription Changes): Exercise Prescription Changes - 08/05/17 1400      Response to Exercise   Blood Pressure (Admit)  130/56    Blood Pressure (Exercise)  110/50    Blood Pressure (Exit)  108/64    Heart Rate (Admit)  66 bpm    Heart Rate (Exercise)  76 bpm    Heart Rate (Exit)  73 bpm    Oxygen Saturation  (Admit)  91 %    Oxygen Saturation (Exercise)  92 %    Oxygen Saturation (Exit)  95 %    Rating of Perceived Exertion (Exercise)  10    Perceived Dyspnea (Exercise)  10    Duration  Progress to 30 minutes of  aerobic without signs/symptoms of physical distress    Intensity  THRR New [id number]      Progression   Progression  Continue to progress workloads to maintain intensity without signs/symptoms of physical distress.      Resistance Training   Training Prescription  Yes    Weight  1    Reps  10-15      Treadmill   MPH  1.6    Grade  0    Minutes  15    METs  2.2      NuStep   Level  2    SPM  105    Minutes  20    METs  1.6      Home Exercise Plan   Plans to continue exercise at  Home (comment)    Frequency  Add 2 additional days to program exercise sessions.    Initial Home Exercises Provided  05/28/17       Nutrition:  Target Goals: Understanding of nutrition guidelines, daily intake of sodium <1528m, cholesterol <2038m calories 30% from fat and 7% or less from saturated fats, daily to have 5 or more servings of fruits and vegetables.  Biometrics: Pre Biometrics - 05/24/17 1425      Pre Biometrics   Height  5' 3"  (1.6 m)    Weight  114 lb 10.2 oz (52 kg)    Waist Circumference  30.5 inches    Hip Circumference  33.5 inches    Waist to Hip Ratio  0.91 %    BMI (Calculated)  20.31    Triceps Skinfold  8 mm    % Body Fat  27 %  Grip Strength  41.4 kg    Flexibility  10.33 in    Single Leg Stand  10 seconds        Nutrition Therapy Plan and Nutrition Goals: Nutrition Therapy & Goals - 08/07/17 1252      Nutrition Therapy   RD appointment deferred  Yes      Personal Nutrition Goals   Nutrition Goal  Diabetic diet.    Personal Goal #2  She is on a diabetic diet. Her diet is very specialized due to her gastrial intestinal problems.     Comments  Patient is following a diabetic diet.       Intervention Plan   Intervention  Nutrition handout(s)  given to patient.    Expected Outcomes  Short Term Goal: Understand basic principles of dietary content, such as calories, fat, sodium, cholesterol and nutrients.       Nutrition Assessments: Nutrition Assessments - 05/24/17 1506      MEDFICTS Scores   Pre Score  9       Nutrition Goals Re-Evaluation:   Nutrition Goals Discharge (Final Nutrition Goals Re-Evaluation):   Psychosocial: Target Goals: Acknowledge presence or absence of significant depression and/or stress, maximize coping skills, provide positive support system. Participant is able to verbalize types and ability to use techniques and skills needed for reducing stress and depression.  Initial Review & Psychosocial Screening: Initial Psych Review & Screening - 05/24/17 1509      Initial Review   Current issues with  None Identified      Family Dynamics   Good Support System?  Yes      Barriers   Psychosocial barriers to participate in program  There are no identifiable barriers or psychosocial needs.      Screening Interventions   Interventions  Encouraged to exercise       Quality of Life Scores: Quality of Life - 05/24/17 1335      Quality of Life Scores   Health/Function Pre  13.17 %    Socioeconomic Pre  26.57 %    Psych/Spiritual Pre  22.93 %    Family Pre  30 %    GLOBAL Pre  20.41 %      Scores of 19 and below usually indicate a poorer quality of life in these areas.  A difference of  2-3 points is a clinically meaningful difference.  A difference of 2-3 points in the total score of the Quality of Life Index has been associated with significant improvement in overall quality of life, self-image, physical symptoms, and general health in studies assessing change in quality of life.   PHQ-9: Recent Review Flowsheet Data    Depression screen [facility] 2/9 05/24/2017   Decreased Interest 0   Down, Depressed, Hopeless 0   PHQ - 2 Score 0   Altered sleeping 1   Tired, decreased energy 2   Change in  appetite 1   Feeling bad or failure about yourself  0   Trouble concentrating 0   Moving slowly or fidgety/restless 0   Suicidal thoughts 0   PHQ-9 Score 4   Difficult doing work/chores Not difficult at all     Interpretation of Total Score  Total Score Depression Severity:  1-4 = Minimal depression, 5-9 = Mild depression, 10-14 = Moderate depression, 15-19 = Moderately severe depression, 20-27 = Severe depression   Psychosocial Evaluation and Intervention: Psychosocial Evaluation - 05/24/17 1509      Psychosocial Evaluation & Interventions   Interventions  Encouraged  to exercise with the program and follow exercise prescription    Continue Psychosocial Services   No Follow up required       Psychosocial Re-Evaluation: Psychosocial Re-Evaluation    [place] Name 06/20/17 1618 07/11/17 0858 08/07/17 1255         Psychosocial Re-Evaluation   Current issues with  None Identified  None Identified  None Identified     Comments  Patient's initial QOL score was 20.41 and her PHQ-9 score was 4.  -  Patient's initial QOL score was 20.41 and her PHQ-9 score was 4.     Expected Outcomes  Patient will have no psychosocial barriers identified at discharge.   Patient will have no psychosocial barriers identified at discharge.   Patient will have no psychosocial barriers identified at discharge.      Interventions  Stress management education;Encouraged to attend Pulmonary Rehabilitation for the exercise;Relaxation education  Encouraged to attend Pulmonary Rehabilitation for the exercise;Relaxation education;Stress management education  Encouraged to attend Pulmonary Rehabilitation for the exercise;Relaxation education;Stress management education     Continue Psychosocial Services   No Follow up required  No Follow up required  No Follow up required        Psychosocial Discharge (Final Psychosocial Re-Evaluation): Psychosocial Re-Evaluation - 08/07/17 1255      Psychosocial Re-Evaluation    Current issues with  None Identified    Comments  Patient's initial QOL score was 20.41 and her PHQ-9 score was 4.    Expected Outcomes  Patient will have no psychosocial barriers identified at discharge.     Interventions  Encouraged to attend Pulmonary Rehabilitation for the exercise;Relaxation education;Stress management education    Continue Psychosocial Services   No Follow up required        Education: Education Goals: Education classes will be provided on a weekly basis, covering required topics. Participant will state understanding/return demonstration of topics presented.  Learning Barriers/Preferences: Learning Barriers/Preferences - 05/24/17 1457      Learning Barriers/Preferences   Learning Barriers  None    Learning Preferences  Video;Written Material;Pictoral;Computer/Internet       Education Topics: How Lungs Work and Diseases: - Discuss the anatomy of the lungs and diseases that can affect the lungs, such as COPD.   Exercise: -Discuss the importance of exercise, FITT principles of exercise, normal and abnormal responses to exercise, and how to exercise safely.   PULMONARY REHAB OTHER RESPIRATORY from 08/01/2017 in [place]  Date  05/30/17  Educator  DJ  Instruction Review Code  2- Demonstrated Understanding      Environmental Irritants: -Discuss types of environmental irritants and how to limit exposure to environmental irritants.   Meds/Inhalers and oxygen: - Discuss respiratory medications, definition of an inhaler and oxygen, and the proper way to use an inhaler and oxygen.   Energy Saving Techniques: - Discuss methods to conserve energy and decrease shortness of breath when performing activities of daily living.    Bronchial Hygiene / Breathing Techniques: - Discuss breathing mechanics, pursed-lip breathing technique,  proper posture, effective ways to clear airways, and other functional breathing techniques   Cleaning  Equipment: - Provides group verbal and written instruction about the health risks of elevated stress, cause of high stress, and healthy ways to reduce stress.   PULMONARY REHAB OTHER RESPIRATORY from 08/01/2017 in [place]  Date  07/04/17  Educator  Etheleen Mayhew  Instruction Review Code  2- Demonstrated Understanding      Nutrition I: Fats: -  Discuss the types of cholesterol, what cholesterol does to the body, and how cholesterol levels can be controlled.   PULMONARY REHAB OTHER RESPIRATORY from 08/01/2017 in [place]  Date  07/11/17  Educator  DJ  Instruction Review Code  2- Demonstrated Understanding      Nutrition II: Labels: -Discuss the different components of food labels and how to read food labels.   Respiratory Infections: - Discuss the signs and symptoms of respiratory infections, ways to prevent respiratory infections, and the importance of seeking medical treatment when having a respiratory infection.   Stress I: Signs and Symptoms: - Discuss the causes of stress, how stress may lead to anxiety and depression, and ways to limit stress.   PULMONARY REHAB OTHER RESPIRATORY from 08/01/2017 in [place]  Date  08/01/17  Educator  Marya Amsler C.  Instruction Review Code  2- Demonstrated Understanding      Stress II: Relaxation: -Discuss relaxation techniques to limit stress.   Oxygen for Home/Travel: - Discuss how to prepare for travel when on oxygen and proper ways to transport and store oxygen to ensure safety.   Knowledge Questionnaire Score: Knowledge Questionnaire Score - 05/24/17 1500      Knowledge Questionnaire Score   Pre Score  23/24       Core Components/Risk Factors/Patient Goals at Admission: Personal Goals and Risk Factors at Admission - 05/24/17 1507      Core Components/Risk Factors/Patient Goals on Admission    Weight Management  Weight Maintenance    Improve shortness of  breath with ADL's  Yes    Intervention  Provide education, individualized exercise plan and daily activity instruction to help decrease symptoms of SOB with activities of daily living.    Expected Outcomes  Short Term: Achieves a reduction of symptoms when performing activities of daily living.    Personal Goal Other  Yes    Personal Goal  Increase strength and stamina, improve breathing and decrease chest discomfort with exertion. Be able to start traveling in their RV again.     Intervention  Attend CR 2 x week and supplement with at home exercise 3 x week.     Expected Outcomes  Reach personal goals.        Core Components/Risk Factors/Patient Goals Review:  Goals and Risk Factor Review    Row Name 06/20/17 1613 07/11/17 0853 08/07/17 1252         Core Components/Risk Factors/Patient Goals Review   Personal Goals Review  Weight Management/Obesity;Improve shortness of breath with ADL's;Develop more efficient breathing techniques such as purse lipped breathing and diaphragmatic breathing and practicing self-pacing with activity.;Heart Failure Gain weight. Get stronger; be able to travel in RV again and visit family.  Weight Management/Obesity;Improve shortness of breath with ADL's;Develop more efficient breathing techniques such as purse lipped breathing and diaphragmatic breathing and practicing self-pacing with activity.;Diabetes;Heart Failure Get stronger; be able to travel in RV again to visit family.  Weight Management/Obesity;Improve shortness of breath with ADL's;Develop more efficient breathing techniques such as purse lipped breathing and diaphragmatic breathing and practicing self-pacing with activity.;Diabetes;Heart Failure     Review  Patient has completed 8 sessions gaining 1 lb. She says she feels the program is helping her. She says she still gets SOB but she feels her endurance and strength have improved. She her husband also has noticed an improvement. She says she is able to do  more around the house without getting fatigued. She is able to climb stairs without  having to stop and rest before she reaches the top. She has not traveled in her RV yet but hopes to in the Spring. Will continue to monitor for progress.   Patient has completed 11 sessions maintaining her weight. She states that she continues to get stronger and is able to do more around the house now without getting fatigued. She says the program is helpinig her meet her goals. She hopes to travel again in her RV in the Spring. Will continue to monitor.   Patient has completed 16 sessions gaining 2 lbs. She states that she continues to get stronger and is able to do more around the house now without getting fatigued. She continues to say the program is helpinig her meet her goals. She has a trip planned in April to travel in RV for 1 to 2 months. She is happy she feels like making this trip.  Will continue to monitor.      Expected Outcomes  Patient will continue to attend sessiona and complete the program and continue to meet her personal goals.   Patient will continue to attend sessions and complete the program continuing to meet her personal goals.   Patient will continue to attend sessions and complete the program continuing to meet her personal goals.         Core Components/Risk Factors/Patient Goals at Discharge (Final Review):  Goals and Risk Factor Review - 08/07/17 1252      Core Components/Risk Factors/Patient Goals Review   Personal Goals Review  Weight Management/Obesity;Improve shortness of breath with ADL's;Develop more efficient breathing techniques such as purse lipped breathing and diaphragmatic breathing and practicing self-pacing with activity.;Diabetes;Heart Failure    Review  Patient has completed 16 sessions gaining 2 lbs. She states that she continues to get stronger and is able to do more around the house now without getting fatigued. She continues to say the program is helpinig her meet her  goals. She has a trip planned in April to travel in RV for 1 to 2 months. She is happy she feels like making this trip.  Will continue to monitor.     Expected Outcomes  Patient will continue to attend sessions and complete the program continuing to meet her personal goals.        ITP Comments: ITP Comments    Row Name 05/24/17 1502           ITP Comments  Mrs. Kneece has been in the program before as a heart patient. She is a brittle diabetic with a insulin pump. She gets dizzy if overly exerted. We will gradually increase her exercise work loads.           Comments: ITP 30 Day REVIEW Pt is making expected progress toward pulmonary rehab goals after completing 16 sessions. Recommend continued exercise, life style modification, education, and utilization of breathing techniques to increase stamina and strength and decrease shortness of breath with exertion.

## 2017-08-08 ENCOUNTER — Encounter (HOSPITAL_COMMUNITY)
Admission: RE | Admit: 2017-08-08 | Discharge: 2017-08-08 | Disposition: A | Discharge status: Home / Self Care | Payer: Medicare Other | Source: Ambulatory Visit | Attending: Cardiology | Admitting: Cardiology

## 2017-08-08 DIAGNOSIS — I5032 Chronic diastolic (congestive) heart failure: Secondary | ICD-10-CM

## 2017-08-08 NOTE — Progress Notes (Signed)
Daily Session Note  Patient Details  Name: [name] [name] MRN: [id number] Date of Birth: 05-02-1951 Referring Provider:     PULMONARY REHAB OTHER RESP ORIENTATION from 05/24/2017 in [place]  Referring Provider  Dr. Aundra Dubin      Encounter Date: 08/08/2017  Check In: Session Check In - 08/08/17 1045      Check-In   Location  [facility]    Staff Present  Suzanne Boron, BS, EP, Exercise Physiologist;Diane Coad, MS, EP, [facility], Exercise Physiologist    Supervising physician immediately available to respond to emergencies  See telemetry face sheet for immediately available MD    Medication changes reported      No    Fall or balance concerns reported     No    Warm-up and Cool-down  Performed as group-led instruction    Resistance Training Performed  Yes    VAD Patient?  No      Pain Assessment   Currently in Pain?  No/denies    Pain Score  0-No pain    Multiple Pain Sites  No       Capillary Blood Glucose: No results found. However, due to the size of the patient record, not all encounters were searched. Please check Results Review for a complete set of results.    Social History   Tobacco Use  Smoking Status Never Smoker  Smokeless Tobacco Never Used    Goals Met:  Proper associated with RPD/PD & O2 Sat Independence with exercise equipment Improved SOB with ADL's Using PLB without cueing & demonstrates good technique Exercise tolerated well No report of cardiac concerns or symptoms Strength training completed today  Goals Unmet:  Not Applicable  Comments: Check out 1145.   Dr. Sinda Du is Medical Director for [facility] Pulmonary Rehab.

## 2017-08-11 ENCOUNTER — Ambulatory Visit (HOSPITAL_COMMUNITY)
Admission: EM | Admit: 2017-08-11 | Discharge: 2017-08-11 | Disposition: A | Discharge status: Home / Self Care | Payer: Medicare Other | Attending: Internal Medicine | Admitting: Internal Medicine

## 2017-08-11 ENCOUNTER — Other Ambulatory Visit: Payer: Self-pay

## 2017-08-11 DIAGNOSIS — H9221 Otorrhagia, right ear: Secondary | ICD-10-CM

## 2017-08-11 NOTE — ED Triage Notes (Addendum)
Per pt she is bleeding from her right ear, per pt Friday she woke up with severe headaches,

## 2017-08-11 NOTE — ED Provider Notes (Signed)
[place]    CSN: [id number] Arrival date & time: 08/11/17  1202     History   Chief Complaint Chief Complaint  Patient presents with  . Otalgia    HPI [name] [name] is a 67 y.o. female history of TIA, CHF, pacemaker, CAD on Brillinta presenting with right ear bleeding. She states this all began Friday with a severe headache. This was not worst headache of life, but she does not normally get headaches. She did get migraines in her 30's but since has not experienced headaches regularly. Took 2 tylenol XS which provided some relief. Yesterday headache was dull, but manageable. This morning she woke up with crusty hair/wet hair and bloody drainage from the right ear. Denies ear pain or loss of hearing. Denies one sided weakness, facial drooping, speech difficulty. No nausea or vomiting.   HPI  Past Medical History:  Diagnosis Date  . Anxiety   . Aortic stenosis    a. bicuspid aortic valve; mean gradient of 20 mmHg in 2/10; b. s/p bioprosth AVR (19-mm Edwards pericardial valve) with a redo coronary artery bypass graft procedure in 07/2009; postoperative Dressler's syndrome    . Arthritis   . Carcinoma, renal cell ([place]) 05/2007   Laparoscopic right nephrectomy  . Chronic diastolic CHF (congestive heart failure) ([place])    a. 9.2014 EchoP EF 60-65%, no rwma, bioprosth AVR, mean gradient of 68mHg, mild to mod MR, PASP 523mg.  . CKD (chronic kidney disease), stage II   . Colitis, ischemic (HCC[place]2008  . Coronary artery disease    a. initial CABG with SVG-RCA in 12/05. b. redo SVG-RCA and bioprosthetic AVR in 1/11. d. 9/14 - PCI with DES to distal LM/proximal LAD was done rather than bypass as she would have been a poor candidate for redo sternotomy. d. S/p DES to prox LM 05/2014.  . Diabetes mellitus 03/2010    TYPE II: Hemoglobin A1c of 7.4 in  03/2010; 8.4 in 06/2010; treated with insulin pump  . Duodenal ulcer    Remote; H. pylori positive  . Gastroparesis due to DM  (HCHerndon10/04/2012  . Genetic testing 01/14/2017   Ms. Danner underwent genetic counseling and testing for hereditary cancer syndromes on 01/01/2017. Her results were negative for mutations in all 83 genes analyzed by [facility]'s 83-gene Common Hereditary Cancers Panel. Genes analyzed include: ALK, APC, ATM, AXIN2, BAP1, BARD1, BLM, BMPR1A, BRCA1, BRCA2, BRIP1, CASR, CDC73, CDH1, CDK4, CDKN1B, CDKN1C, CDKN2A, CEBPA, CHEK2, CTNNA1, DICER1, DIS3L2,   . Hodgkin's disease (HCMediapolis1991   [place] radiation therapy  . Hyperlipidemia   . Hyperplastic gastric polyp 06/23/2012  . Hypertension   . Iron deficiency anemia    a. 03/2013 EGD: essentially normal. Possible slow GIB.  . Migraines   . NASH (nonalcoholic steatohepatitis) 1999   -biopsy in 1999  . OSA on CPAP 04/24/2017   Moderate with AHI 22/hr now on CPAP at 18cm H2o  . Osteopenia     hip on DEXA in October 2007.  . Osteoporosis   . PAF (paroxysmal atrial fibrillation) (HC[place]   a.  h/o brief episodes noted on ppm interrogation. b. given GI bleeding and need to be on both ASA 81 and Brilinta, risks likely outweigh benefits of anticoagulation.   . Small bowel obstruction (HCC)    Recurrent; resolved after resection of a lipoma  . Syncope    a. H/o recurrent syncope with pauses on loop recorder s/p Medtronic pacemaker 07/2012.    Patient Active Problem List  Diagnosis Date Noted  . OSA on CPAP 04/24/2017  . Genetic testing 01/14/2017  . S/P left mastectomy 07/29/2016  . Dense breast 12/11/2015  . Hx of iron deficiency anemia 12/11/2015  . Chronic anticoagulation 12/11/2015  . Breast cancer screening, high risk patient 12/11/2015  . Type 1 diabetes mellitus ([place]) 11/17/2015  . 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 Hx of radiation therapy 04/23/2015  . Thrombocytopenia ([place]) 04/23/2015  . 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 Osteopenia  determined by x-ray 04/23/2015  . DM (diabetes mellitus) with complications ([place]) 92/42/6834  . Radiation therapy complication 19/62/2297  . History of nodular sclerosis Hodgkin's disease 10/27/2014  . Iron deficiency anemia due to chronic blood loss 10/27/2014  . Screening mammogram for high-risk patient 10/27/2014  . Insulin dependent type 1 diabetes mellitus ([place]) 10/07/2014  . Diabetic gastroparesis ([place]) 07/30/2014  . Claudication ([place]) 07/29/2014  . PAF (paroxysmal atrial fibrillation) ([place])   . CKD (chronic kidney disease), stage II   . Coronary artery disease involving native coronary artery of native heart with unstable angina pectoris ([place])   . Type 2 diabetes mellitus with complication ([place])   . Abdominal bloating 06/09/2013  . Feeling bilious 06/09/2013  . Iron deficiency anemia 04/11/2013  . Unstable angina ([place]) 04/11/2013  . CAD (coronary artery disease) of artery bypass graft 04/03/2013  . Chronic diastolic CHF (congestive heart failure) ([place]) 04/03/2013  . Anemia 04/03/2013  . Arterial vascular disease 03/12/2013  . Aorta disorder ([place]) 03/12/2013  . Atrial fibrillation ([place]) 03/12/2013  . Artificial cardiac pacemaker 03/12/2013  . CCF (congestive cardiac failure) ([place]) 03/12/2013  . Chronic kidney disease (CKD), stage III (moderate) ([place]) 03/12/2013  . Gastric reflux 03/12/2013  . 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 Big thyroid 03/12/2013  . H/O Hodgkin's lymphoma 03/12/2013  . BP (high blood pressure) 03/12/2013  . Fitting and adjustment of insulin pump 03/12/2013  . Disorder of bone and cartilage, unspecified 03/12/2013  . Aortic heart valve narrowing 03/12/2013  . Encounter for fitting or adjustment of insulin pump 03/12/2013  . Congestive heart failure ([place]) 03/12/2013  . Ischemic colitis ([place]) 03/12/2013  . Lipoma of neck 09/23/2012  . Pacemaker 09/08/2012  . Chronic kidney disease, stage 3/cardiorenal syndrome 08/15/2012  . Atrial flutter ([place]) 07/23/2012  .  Sick sinus syndrome ([place]) 07/21/2012  . Hyperplastic gastric polyp 06/23/2012  . Gastroparesis due to DM ([place]) 05/01/2012  . Hypercalcemia   . Cholelithiasis 07/20/2011  . Aortic stenosis status post 19 mm Edwards pericardial valve replacement 2011   . Arteriosclerotic cardiovascular disease (ASCVD)   . Osteopenia   . Anemia, normocytic normochromic   . NASH (nonalcoholic steatohepatitis)   . Renal cell carcinoma (HCC)     Class: History of  . Duodenal ulcer     Class: History of  . Ischemic colitis     Class: History of  . Gastroesophageal reflux disease   . Hodgkin's disease ([place])   . Hypertension   . Small bowel obstruction ([place])   . Hyperlipidemia 04/11/2010  . Diabetes mellitus type 2, controlled ([place]) 03/23/2010  . Colonic polyps, adenomatous 09/29/2009    Past Surgical History:  Procedure Laterality Date  . AORTIC VALVE REPLACEMENT  2011   Aortic valve replacement surgery with a 19 mm bioprosthetic device post redo CABG January 2011.  Marland Kitchen BONE MARROW BIOPSY    . BREAST EXCISIONAL BIOPSY     benign,  bilateral  . CARDIAC CATHETERIZATION    . CERVICAL BIOPSY    . COLONOSCOPY     Multiple with adenomatous polyps  . COLONOSCOPY  06/23/2012   Procedure: COLONOSCOPY;  Surgeon: Gatha Mayer, MD;  Location: [facility] END[facility]COPY;  Service: Endoscopy;  Laterality: N/A;  . CORONARY ARTERY BYPASS GRAFT  2005, 2011   CABG-most recent SVG to RCA-12/05; RCA occluded with patent graft in 2006; Redo bypass in 07/2009  . ESOPHAGOGASTRODUODENOSCOPY    . ESOPHAGOGASTRODUODENOSCOPY  06/23/2012   Procedure: ESOPHAGOGASTRODUODENOSCOPY (EGD);  Surgeon: Gatha Mayer, MD;  Location: [facility];  Service: Endoscopy;  Laterality: N/A;  . ESOPHAGOGASTRODUODENOSCOPY N/A 04/06/2013   Procedure: ESOPHAGOGASTRODUODENOSCOPY (EGD);  Surgeon: Irene Shipper, MD;  Location: [facility] END[facility]COPY;  Service: Endoscopy;  Laterality: N/A;  . EXPLORATORY LAPAROTOMY W/ BOWEL RESECTION     Small bowel resection of lipoma  .  GIVENS CAPSULE STUDY N/A 04/30/2013   Procedure: GIVENS CAPSULE STUDY;  Surgeon: Gatha Mayer, MD;  Location: [facility];  Service: Endoscopy;  Laterality: N/A;  . LEFT AND RIGHT HEART CATHETERIZATION WITH CORONARY ANGIOGRAM N/A 04/07/2013   Procedure: LEFT AND RIGHT HEART CATHETERIZATION WITH CORONARY ANGIOGRAM;  Surgeon: Larey Dresser, MD;  Location: [facility] CATH LAB;  Service: Cardiovascular;  Laterality: N/A;  . LEFT ATRIAL APPENDAGE OCCLUSION N/A 02/16/2016   Watchman not placed - nickel allergy  . LEFT HEART CATHETERIZATION WITH CORONARY/GRAFT ANGIOGRAM N/A 06/07/2014   Procedure: LEFT HEART CATHETERIZATION WITH Beatrix Fetters;  Surgeon: Burnell Blanks, MD;  Location: [facility] CATH LAB;  Service: Cardiovascular;  Laterality: N/A;  . LIVER BIOPSY    . LOOP RECORDER IMPLANT N/A 12/10/2011   Procedure: LOOP RECORDER IMPLANT;  Surgeon: Evans Lance, MD;  Location: [facility] C[facility] LAB;  Service: Cardiovascular;  Laterality: N/A;  . MALONEY DILATION  06/23/2012   Procedure: [name] DILATION;  Surgeon: Gatha Mayer, MD;  Location: [facility] ENDOSCOPY;  Service: Endoscopy;;  54 fr  . MASTECTOMY Left 05/17/2015   total   . NEPHRECTOMY  2008   Laparoscopic right nephrectomy, renal cell carcinoma  . PACEMAKER INSERTION  08/21/2012   Medtronic Adapta L dual-chamber pacemaker  . PERCUTANEOUS CORONARY STENT INTERVENTION (PCI-S) N/A 04/09/2013   Procedure: PERCUTANEOUS CORONARY STENT INTERVENTION (PCI-S);  Surgeon: Burnell Blanks, MD;  Location: [facility] CATH LAB;  Service: Cardiovascular;  Laterality: N/A;  . PERMANENT PACEMAKER INSERTION N/A 08/21/2012   Procedure: PERMANENT PACEMAKER INSERTION;  Surgeon: Evans Lance, MD;  Location: [facility] CATH LAB;  Service: Cardiovascular;  Laterality: N/A;  . RIGHT HEART CATH AND CORONARY/GRAFT ANGIOGRAPHY N/A 04/19/2017   Procedure: RIGHT HEART CATH AND CORONARY/GRAFT ANGIOGRAPHY;  Surgeon: Larey Dresser, MD;  Location: [place] CV LAB;  Service: Cardiovascular;   Laterality: N/A;  . TEE WITHOUT CARDIOVERSION N/A 12/07/2015   Procedure: TRANSESOPHAGEAL ECHOCARDIOGRAM (TEE);  Surgeon: Larey Dresser, MD;  Location: [place];  Service: Cardiovascular;  Laterality: N/A;  . TOTAL ABDOMINAL HYSTERECTOMY W/ BILATERAL SALPINGOOPHORECTOMY  2000  . TOTAL MASTECTOMY Left 05/17/2015   Procedure: LEFT TOTAL MASTECTOMY;  Surgeon: Rolm Bookbinder, MD;  Location: [place];  Service: General;  Laterality: Left;  . TUMOR EXCISION  1981   removal of Hodgkins lymphoma    OB History    No data available       Home Medications    Prior to Admission medications   Medication Sig Start Date End Date Taking? Authorizing Provider  ALPRAZolam Duanne Moron) 0.5 MG tablet Take 0.5 mg by mouth at bedtime. May  take additional dose daily for anxiety   Yes [provider]  amLODipine (NORVASC) 5 MG tablet Take 0.5 tablets (2.5 mg total) by mouth daily. 03/05/17  Yes Larey Dresser, MD  APIDRA 100 UNIT/ML injection Inject 46 Units into the skin as directed. INFUSE THROUGH INSULIN PUMP UTD::[place] 46.15 units daily, Bolus depends on carb intake 02/04/15  Yes [provider]  aspirin EC 81 MG tablet Take 81 mg by mouth at bedtime.   Yes [provider]  Cholecalciferol 2000 UNITS TABS Take 1 tablet by mouth every morning.    Yes [provider]  Coenzyme Q10 (CO Q 10) 100 MG CAPS Take 1 capsule by mouth at bedtime.    Yes [provider]  colchicine 0.6 MG tablet Take 0.6 mg by mouth at bedtime. And,Take 1 tablet at onset and repeate in 1 hour and then 1 daily x 2 (FOR FLARES)    Yes [provider]  docusate sodium (COLACE) 100 MG capsule Take 100 mg by mouth at bedtime.   Yes [provider]  isosorbide mononitrate (IMDUR) 120 MG 24 hr tablet Take 120 mg by mouth daily.   Yes [provider]  latanoprost (XALATAN) 0.005 % ophthalmic solution Place 1 drop into both eyes at bedtime.   Yes [provider]    Magnesium Oxide 250 MG TABS Take 250 mg by mouth at bedtime.    Yes [provider]  metoprolol tartrate (LOPRESSOR) 50 MG tablet TAKE 1 AND 1/2 TABLETS(75 MG) BY MOUTH TWICE DAILY 07/08/17  Yes Evans Lance, MD  Multiple Vitamins-Minerals (MULTIVITAMIN WITH MINERALS) tablet Take 1 tablet by mouth every morning.    Yes [provider]  pantoprazole (PROTONIX) 40 MG tablet Take 1 tablet (40 mg total) by mouth daily. 03/21/17  Yes Larey Dresser, MD  Polyethyl Glycol-Propyl Glycol (SYSTANE) 0.4-0.3 % SOLN Apply 1 drop to eye daily as needed (dry eyes).   Yes [provider]  potassium chloride (MICRO-K) 10 MEQ CR capsule Take 4 capsules (40 mEq total) by mouth 2 (two) times daily. 03/21/17  Yes Larey Dresser, MD  pravastatin (PRAVACHOL) 10 MG tablet TAKE 1 TABLET BY MOUTH ONCE DAILY 03/26/17  Yes Larey Dresser, MD  spironolactone (ALDACTONE) 25 MG tablet Take 0.5 tablets (12.5 mg total) by mouth daily. 05/15/17 08/13/17 Yes Larey Dresser, MD  ticagrelor (BRILINTA) 60 MG TABS tablet TAKE 1 TABLET(60 MG) BY MOUTH TWICE DAILY 04/29/17  Yes Larey Dresser, MD  torsemide (DEMADEX) 20 MG tablet Take 80 mg (4 tabs) in AM, then 60 mg (3 tabs) in PM 05/15/17  Yes Larey Dresser, MD  triamcinolone cream (KENALOG) 0.5 % Apply 1 application topically daily as needed (inflamation).    Yes [provider]  ULORIC 40 MG tablet Take 40 mg by mouth at bedtime.  03/15/16  Yes [provider]  vitamin C (ASCORBIC ACID) 500 MG tablet Take 500 mg by mouth 3 (three) times a week.   Yes [provider]  amLODipine (NORVASC) 5 MG tablet Take 0.5 tablets (2.5 mg total) by mouth daily. 07/30/17   Larey Dresser, MD  azithromycin [facility]) 500 MG tablet 1 tablet by mouth one hour prior to dental appointment/procedure 12/21/13   Larey Dresser, MD  Calcium Carbonate-Vitamin D (RA CALCIUM PLUS VITAMIN D) 600-400 MG-UNIT per tablet Take 1 tablet by mouth daily.      [provider]  clobetasol (OLUX) 0.05 % topical foam  Apply 1 application topically daily. 03/28/17   [provider]  Fe Fum-FA-B Cmp-C-Zn-Mg-Mn-Cu (HEMATINIC PLUS COMPLEX) 106-1 MG TABS Take 1 tablet by mouth every Monday, Wednesday, and Friday. Take 1 tablet oraly three times a week or as directed daily with with a Vitamin C tablet 02/04/17   Nicholas Lose, MD  glucagon (GLUCAGON EMERGENCY) 1 MG injection Inject 1 mg into the vein once as needed (for severe reaction). Reported on 12/05/2015    [provider]  isosorbide mononitrate (IMDUR) 60 MG 24 hr tablet Take 2 tablets (120 mg total) by mouth daily. 07/18/17   Larey Dresser, MD  metolazone (ZAROXOLYN) 2.5 MG tablet Take 2.5 mg (1 tab) once as directed by CHF clinic. Call before taking [phone] 03/06/17   Larey Dresser, MD  nitroGLYCERIN (NITROLINGUAL) 0.4 MG/SPRAY spray Place 1 spray under the tongue every 5 (five) minutes as needed for chest pain. 05/14/16   Larey Dresser, MD  potassium chloride (K-DUR,KLOR-CON) 10 MEQ tablet TAKE 6 CAPSULES(60 MEQ) BY MOUTH TWICE DAILY 07/18/17   Larey Dresser, MD  PRALUENT 150 MG/ML SOPN INJECT 1 PEN INTO THE SKIN EVERY 14 (FOURTEEN) DAYS. 05/17/17 08/15/17  Bensimhon, Shaune Pascal, MD    Family History Family History  Problem Relation Age of Onset  . Heart attack Mother        d.61  . Heart disease Mother   . Heart attack Father        d.60  . Diabetes Father   . Heart disease Father   . Lung cancer Sister        d.61 metastases to brain. History of smoking. Maternal half-sister.  . Cancer Sister 30       unspecified gynecologic cancer (either cervical or ovarian) in her 24s.  . Cancer Maternal Grandfather 50       d.52s oral cancer  . Hypertension Brother   . Glaucoma Brother   . Breast cancer Sister 85       d.42 from metastatic disease. Paternal half-sister.  . Colon cancer Neg Hx   . Stomach cancer Neg Hx     Social History Social History    Tobacco Use  . Smoking status: Never Smoker  . Smokeless tobacco: Never Used  Substance Use Topics  . Alcohol use: No    Alcohol/week: 0.0 oz  . Drug use: No     Allergies   Avandia [rosiglitazone maleate]; Cephalexin; Clindamycin hcl; Lincomycin; Metformin; Novolog [insulin aspart]; Penicillins; Tizanidine; Adhesive [tape]; Atorvastatin; Cholestyramine; Crestor [rosuvastatin]; Gentamycin [gentamicin]; Humalog [insulin lispro]; Iodinated diagnostic agents; Limonene; Pravastatin; Prednisone; Simvastatin; Strawberry extract; Doxycycline; Plavix [clopidogrel bisulfate]; Codeine; Erythromycin; Erythromycin base; Hydrocodone-acetaminophen; Latex; Neomycin; Nickel; Ranitidine; Tamiflu [oseltamivir]; and Tramadol   Review of Systems Review of Systems  Constitutional: Negative for fatigue and fever.  HENT: Positive for ear discharge. Negative for congestion and ear pain.   Respiratory: Negative for shortness of breath.   Cardiovascular: Negative for chest pain.  Gastrointestinal: Negative for nausea and vomiting.  Neurological: Negative for dizziness, syncope, speech difficulty, weakness, light-headedness, numbness and headaches.  Psychiatric/Behavioral: Negative for confusion.     Physical Exam Triage Vital Signs ED Triage Vitals  Enc Vitals Group     BP 08/11/17 1214 (!) 151/59     Pulse Rate 08/11/17 1214 82     Resp --      Temp 08/11/17 1214 (!) 97.5 F (36.4 C)     Temp src --      SpO2 08/11/17 1214 99 %  Weight --      Height --      Head Circumference --      Peak Flow --      Pain Score 08/11/17 1221 2     Pain Loc --      Pain Edu? --      Excl. in [place]? --    No data found.  Updated Vital Signs BP (!) 151/59   Pulse 82   Temp (!) 97.5 F (36.4 C)   SpO2 99%    Physical Exam  Constitutional: She is oriented to person, place, and time. She appears well-developed and well-nourished. No distress.  HENT:  Head: Normocephalic and atraumatic. Head is  without Battle's sign.  Right Ear: Tympanic membrane normal.  Left Ear: Tympanic membrane and ear canal normal.  Nose: Nose normal.  Mouth/Throat: Uvula is midline, oropharynx is clear and moist and mucous membranes are normal. No oral lesions. No trismus in the jaw. No uvula swelling.  Bright red blood in EAC, no hemotympanum, no irritation/visible source of blood. TM does not appear perforated. No tenderness to tragus or mastoid.   Palate elevates symmetrically, clear speech Symmetric facial expressions  Eyes: Conjunctivae are normal.  Neck: Neck supple.  Cardiovascular: Normal rate and regular rhythm.  No murmur heard. Pulmonary/Chest: Effort normal and breath sounds normal. No respiratory distress.  Abdominal: Soft. There is no tenderness.  Musculoskeletal: She exhibits no edema.  Neurological: She is alert and oriented to person, place, and time. She has normal strength. No cranial nerve deficit or sensory deficit. Coordination normal.  Cranial nerves 2-12 grossly intact- no facial droop or one sided facial weakness. Speaking clearly without slurring or difficulty. Strength 5/5 bilaterally in shoulders, elbows, hips and knees. Patellar reflex 1+ on left, unable to obtain on right- patient states it has always been difficult.   Negative romberg and pronator drift.   Coordination appears to be intact- normal finger to nose, RAM, heel to shin.   Gait without abnormality.   Skin: Skin is warm and dry.  Psychiatric: She has a normal mood and affect.  Nursing note and vitals reviewed.    UC Treatments / Results  Labs (all labs ordered are listed, but only abnormal results are displayed) Labs Reviewed - No data to display  EKG  EKG Interpretation None       Radiology No results found.  Procedures Procedures (including critical care time)  Medications Ordered in UC Medications - No data to display   Initial Impression / Assessment and Plan / UC Course  I have  reviewed the triage vital signs and the nursing notes.  Pertinent labs & imaging results that were available during my care of the patient were reviewed by me and considered in my medical decision making (see chart for details).    Patient with blood in the canal- no signs of trauma or infection, TM intact without hemotympanum or erythema. Unclear source of blood. Canal not actively bleeding. Clot removed. She appears to bleed easily due to [place]. Appears stable and no longer bleeding. Will monitor for resolution. Following up with Dr. Mannie Stabile on Tuesday (in 2 days)  Patient with many risk factors for CVA, no neuro deficits, exam symmetric and grossly normal. Mentation normal. Does not appear to need immediate attention at this point, advised patient to have low threshold to go to ED given her history.  Advised patient to monitor for development of signs of stroke. Discussed strict return precautions. Patient verbalized understanding and  is agreeable with plan.   Final Clinical Impressions(s) / UC Diagnoses   Final diagnoses:  Ear bleeding, right    ED Discharge Orders    None       Controlled Substance Prescriptions [place] Controlled Substance Registry consulted? Not Applicable   Janith Lima, [place] 08/11/17 2208

## 2017-08-11 NOTE — Discharge Instructions (Signed)
Please continue to monitor for signs of stroke: weakness, visual changes, speech slurring, facial droop, difficulty speaking and one-sided paralysis, syncope. Severe headache.   If ear starts to become swollen, painful, decreased hearing.

## 2017-08-13 ENCOUNTER — Ambulatory Visit: Payer: Medicare Other | Admitting: Family Medicine

## 2017-08-13 ENCOUNTER — Encounter: Payer: Self-pay | Admitting: Family Medicine

## 2017-08-13 ENCOUNTER — Other Ambulatory Visit: Payer: Self-pay

## 2017-08-13 ENCOUNTER — Encounter (HOSPITAL_COMMUNITY)
Admission: RE | Admit: 2017-08-13 | Discharge: 2017-08-13 | Disposition: A | Discharge status: Home / Self Care | Payer: Medicare Other | Source: Ambulatory Visit | Attending: Cardiology | Admitting: Cardiology

## 2017-08-13 VITALS — BP 130/74 | HR 75 | Temp 98.4°F | Resp 16 | Ht 62.0 in | Wt 117.8 lb

## 2017-08-13 DIAGNOSIS — I10 Essential (primary) hypertension: Secondary | ICD-10-CM | POA: Diagnosis not present

## 2017-08-13 DIAGNOSIS — D696 Thrombocytopenia, unspecified: Secondary | ICD-10-CM

## 2017-08-13 DIAGNOSIS — H9221 Otorrhagia, right ear: Secondary | ICD-10-CM | POA: Diagnosis not present

## 2017-08-13 DIAGNOSIS — Z79899 Other long term (current) drug therapy: Secondary | ICD-10-CM | POA: Diagnosis not present

## 2017-08-13 DIAGNOSIS — B372 Candidiasis of skin and nail: Secondary | ICD-10-CM | POA: Diagnosis not present

## 2017-08-13 DIAGNOSIS — Z853 Personal history of malignant neoplasm of breast: Secondary | ICD-10-CM

## 2017-08-13 DIAGNOSIS — Z8571 Personal history of Hodgkin lymphoma: Secondary | ICD-10-CM | POA: Diagnosis not present

## 2017-08-13 DIAGNOSIS — Z85528 Personal history of other malignant neoplasm of kidney: Secondary | ICD-10-CM

## 2017-08-13 DIAGNOSIS — F419 Anxiety disorder, unspecified: Secondary | ICD-10-CM

## 2017-08-13 DIAGNOSIS — I5032 Chronic diastolic (congestive) heart failure: Secondary | ICD-10-CM | POA: Diagnosis not present

## 2017-08-13 LAB — CBC WITH DIFFERENTIAL/PLATELET
BASOS ABS: 81 {cells}/uL (ref 0–200)
BASOS PCT: 1 %
EOS ABS: 211 {cells}/uL (ref 15–500)
Eosinophils Relative: 2.6 %
HCT: 37.9 % (ref 35.0–45.0)
Hemoglobin: 12.8 g/dL (ref 11.7–15.5)
Lymphs Abs: 1798 cells/uL (ref 850–3900)
MCH: 30.3 pg (ref 27.0–33.0)
MCHC: 33.8 g/dL (ref 32.0–36.0)
MCV: 89.6 fL (ref 80.0–100.0)
MPV: 11.7 fL (ref 7.5–12.5)
Monocytes Relative: 8.6 %
NEUTROS ABS: 5314 {cells}/uL (ref 1500–7800)
Neutrophils Relative %: 65.6 %
Platelets: 108 10*3/uL — ABNORMAL LOW (ref 140–400)
RBC: 4.23 10*6/uL (ref 3.80–5.10)
RDW: 13.7 % (ref 11.0–15.0)
Total Lymphocyte: 22.2 %
WBC: 8.1 10*3/uL (ref 3.8–10.8)
WBCMIX: 697 {cells}/uL (ref 200–950)

## 2017-08-13 MED ORDER — TERCONAZOLE 0.4 % VA CREA: 1.0000 | TOPICAL_CREAM | Freq: Every day | VAGINAL | 0 refills | Status: DC

## 2017-08-13 NOTE — Progress Notes (Signed)
Patient ID: [name] [name], female    DOB: 11-Feb-1951, 67 y.o.   MRN: [id number]  Chief Complaint  Patient presents with  . Bleeding/Bruising    right ear; seen at urgent care in [place]; three times    Allergies Avandia [rosiglitazone maleate]; Cephalexin; Clindamycin hcl; Lincomycin; Metformin; Novolog [insulin aspart]; Penicillins; Tizanidine; Adhesive [tape]; Atorvastatin; Cholestyramine; Crestor [rosuvastatin]; Gentamycin [gentamicin]; Humalog [insulin lispro]; Iodinated diagnostic agents; Limonene; Pravastatin; Prednisone; Simvastatin; Strawberry extract; Doxycycline; Plavix [clopidogrel bisulfate]; Codeine; Erythromycin; Erythromycin base; Hydrocodone-acetaminophen; Latex; Neomycin; Nickel; Ranitidine; Tamiflu [oseltamivir]; and Tramadol  Subjective:   [name] [name] is a 67 y.o. female who presents to [facility] today.  HPI Here to establish care as anew patient visit. Needs a new PCP. Has been seen by Dr. Roseanne Reno for years in [place], [place]. Is currently seeing her specialists and being compliant with her medicaitons and visits. Currently in pulmonary/cardiac rehab until mid-February.   Is followed by numerous other physicians due to her complicated medical history.  She is followed by: Cardiology: Dr. Kirk Ruths McLean/Dr. Lovena Le (pacemaker) Oncologist: Does not have an oncologist at this time.  Her hematologist/oncologist retired.  Has a history of Hodgkin's lymphoma status post mantle cell radiation, RCC (s/p nephrectomy) Nephrology: Dr. Venda Rodes in summer 2018/sees on a yearly basis Gastro[facility]ology: [facility]Liver Endocrinology: [facility]/Insulin Pump Pulmonologist: Dr. Blythe Stanford;  Bone specialist: Dr. Archer Asa   Here to discuss bleeding from ear canal. Was seen at the [facility]. Ear has continued to bleed from ear canal. Has not had any rashes.  Denies any hematuria, hemoptysis, bright red blood per rectum, hematochezia or melena.  Reports that the past  several days has woken up and had bleeding from her ear canal.  Not sure if she is scratching the area at night.  Reports that has a history of bleeding easily and it taking a long time for her to stop bleeding.  Denies any pain in her ear.  Reports that she can hear well.  Was seen at the urgent care and told to follow-up with her primary care physician.  Does not have any symptoms related to blood loss.  Denies any chest pain, shortness of breath, or palpitations.  Reports that she does have issues with her breathing at second to her heart and lung problems but they have not changed over the past several months.  She denies any history of skin lesions.  No prior history of bleeding.  Does take Brilinta on a daily basis.  Uses xanax 0. 5 mg po qhs for 15 years for anxiety. Reports that her brain will not slow down if she does not take the medication.  Denies a history of depression.  Does not want to try any other medications for anxiety.  Reports that she can tend at time to worry about health but deals with stress and anxiety well.  Is active in her church and community.  Has 2 children.  Married for over 40 years.  Uses a CPAP for sleep at night.  Patient reports that she has had some vaginal itching for the past several days and it has been bothering her.  She reports she has had yeast infections in the past and is prone to them with her diabetes.  She denies any pain in her vaginal area but reports some itching.  In addition under her right arm she has had a few red dots and noticed some itching there also.    Past Medical History:  Diagnosis Date  . Anxiety   .  Aortic stenosis    a. bicuspid aortic valve; mean gradient of 20 mmHg in 2/10; b. s/p bioprosth AVR (19-mm Edwards pericardial valve) with a redo coronary artery bypass graft procedure in 07/2009; postoperative Dressler's syndrome    . Arthritis   . Breast cancer ([place])   . Carcinoma, renal cell ([place]) 05/2007   Laparoscopic right  nephrectomy  . 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 Chronic diastolic CHF (congestive heart failure) ([place])    a. 9.2014 EchoP EF 60-65%, no rwma, bioprosth AVR, mean gradient of 68mHg, mild to mod MR, PASP 517mg.  . CKD (chronic kidney disease), stage II   . Colitis, ischemic ([facility]2008  . Coronary artery disease    a. initial CABG with SVG-RCA in 12/05. b. redo SVG-RCA and bioprosthetic AVR in 1/11. d. 9/14 - PCI with DES to distal LM/proximal LAD was done rather than bypass as she would have been a poor candidate for redo sternotomy. d. S/p DES to prox LM 05/2014.  . Diabetes mellitus 03/2010    TYPE II: Hemoglobin A1c of 7.4 in  03/2010; 8.4 in 06/2010; treated with insulin pump  . Duodenal ulcer    Remote; H. pylori positive  . Gastroparesis due to DM ([facility]CC[facility]ifornia [facility]ines10/04/2012  . Genetic testing 01/14/2017   Ms. Milhouse underwent genetic counseling and testing for hereditary cancer syndromes on 01/01/2017. Her results were negative for mutations in all 83 genes analyzed by [facility]'s 83-gene Common Hereditary Cancers Panel. Genes analyzed include: ALK, APC, ATM, AXIN2, BAP1, BARD1, BLM, BMPR1A, BRCA1, BRCA2, BRIP1, CASR, CDC73, CDH1, CDK4, CDKN1B, CDKN1C, CDKN2A, CEBPA, CHEK2, CTNNA1, DICER1, DIS3L2,   . Hodgkin's disease ([facility]CFair[place]1991   Mantle radiation therapy  . Hodgkin's disease ([facility]  . Hyperlipidemia   . Hyperplastic gastric polyp 06/23/2012  . Hypertension   . Iron deficiency anemia    a. 03/2013 EGD: essentially normal. Possible slow GIB.  . Migraines   . NASH (nonalcoholic steatohepatitis) 1999   -biopsy in 1999  . OSA on CPAP 04/24/2017   Moderate with AHI 22/hr now on CPAP at 18cm H2o  . Osteopenia     hip on DEXA in October 2007.  . Osteoporosis   . Oxygen deficiency   . Pacemaker    six sinus syndrome  . PAF (paroxysmal atrial fibrillation) (HCO[facility] Hills   a.  h/o brief episodes noted on ppm interrogation. b. given GI bleeding and need to be on both ASA 81 and Brilinta, risks  likely outweigh benefits of anticoagulation.   . Small bowel obstruction (HCC)    Recurrent; resolved after resection of a lipoma  . Syncope    a. H/o recurrent syncope with pauses on loop recorder s/p Medtronic pacemaker 07/2012.    Past Surgical History:  Procedure Laterality Date  . ABDOMINAL HYSTERECTOMY    . AORTIC VALVE REPLACEMENT  2011   Aortic valve replacement surgery with a 19 mm bioprosthetic device post redo CABG January 2011.  . Marland KitchenONE MARROW BIOPSY    . BREAST EXCISIONAL BIOPSY     benign, bilateral  . CARDIAC CATHETERIZATION    . CERVICAL BIOPSY    . COLONOSCOPY     Multiple with adenomatous polyps  . COLONOSCOPY  06/23/2012   Procedure: COLONOSCOPY;  Surgeon: CaGatha MayerMD;  [facility]: [facility] ENDOSCOPY;  Service: Endoscopy;  Laterality: N/A;  . CORONARY ARTERY BYPASS GRAFT  2005, 2011   CABG-most recent SVG to RCA-12/05; RCA occluded with patent graft in 2006; Redo bypass in 07/2009  .  ESOPHAGOGASTRODUODENOSCOPY    . ESOPHAGOGASTRODUODENOSCOPY  06/23/2012   Procedure: ESOPHAGOGASTRODUODENOSCOPY (EGD);  Surgeon: Gatha Mayer, MD;  [facility];  Service: Endoscopy;  Laterality: N/A;  . ESOPHAGOGASTRODUODENOSCOPY N/A 04/06/2013   Procedure: ESOPHAGOGASTRODUODENOSCOPY (EGD);  Surgeon: Irene Shipper, MD;  Location: [facility] ENDOSCOPY;  Service: Endoscopy;  Laterality: N/A;  . EXPLORATORY LAPAROTOMY W/ BOWEL RESECTION     Small bowel resection of lipoma  . GIVENS CAPSULE STUDY N/A 04/30/2013   Procedure: GIVENS CAPSULE STUDY;  Surgeon: Gatha Mayer, MD;  Location: [place];  Service: Endoscopy;  Laterality: N/A;  . LEFT AND RIGHT HEART CATHETERIZATION WITH CORONARY ANGIOGRAM N/A 04/07/2013   Procedure: LEFT AND RIGHT HEART CATHETERIZATION WITH CORONARY ANGIOGRAM;  Surgeon: Larey Dresser, MD;  Location: [facility] C[facility] LAB;  Service: Cardiovascular;  Laterality: N/A;  . LEFT ATRIAL APPENDAGE OCCLUSION N/A 02/16/2016   Watchman not placed - nickel allergy  . LEFT HEART  CATHETERIZATION WITH CORONARY/GRAFT ANGIOGRAM N/A 06/07/2014   Procedure: LEFT HEART CATHETERIZATION WITH Beatrix Fetters;  Surgeon: Burnell Blanks, MD;  Location: [facility] CATH LAB;  Service: Cardiovascular;  Laterality: N/A;  . LIVER BIOPSY    . LOOP RECORDER IMPLANT N/A 12/10/2011   Procedure: LOOP RECORDER IMPLANT;  Surgeon: Evans Lance, MD;  Location: [facility] CATH LAB;  Service: Cardiovascular;  Laterality: N/A;  . MALONEY DILATION  06/23/2012   Procedure: [name] DILATION;  Surgeon: Gatha Mayer, MD;  Location: [facility] ENDOSCOPY;  Service: Endoscopy;;  56 fr  . MASTECTOMY Left 05/17/2015   total   . NEPHRECTOMY  2008   Laparoscopic right nephrectomy, renal cell carcinoma  . PACEMAKER INSERTION  08/21/2012   Medtronic Adapta L dual-chamber pacemaker  . PERCUTANEOUS CORONARY STENT INTERVENTION (PCI-S) N/A 04/09/2013   Procedure: PERCUTANEOUS CORONARY STENT INTERVENTION (PCI-S);  Surgeon: Burnell Blanks, MD;  Location: [facility] CATH LAB;  Service: Cardiovascular;  Laterality: N/A;  . PERMANENT PACEMAKER INSERTION N/A 08/21/2012   Procedure: PERMANENT PACEMAKER INSERTION;  Surgeon: Evans Lance, MD;  Location: [facility] CATH LAB;  Service: Cardiovascular;  Laterality: N/A;  . RIGHT HEART CATH AND CORONARY/GRAFT ANGIOGRAPHY N/A 04/19/2017   Procedure: RIGHT HEART CATH AND CORONARY/GRAFT ANGIOGRAPHY;  Surgeon: Larey Dresser, MD;  Location: [facility] CV LAB;  Service: Cardiovascular;  Laterality: N/A;  . TEE WITHOUT CARDIOVERSION N/A 12/07/2015   Procedure: TRANSESOPHAGEAL ECHOCARDIOGRAM (TEE);  Surgeon: Larey Dresser, MD;  Location: [place];  Service: Cardiovascular;  Laterality: N/A;  . TOTAL ABDOMINAL HYSTERECTOMY W/ BILATERAL SALPINGOOPHORECTOMY  2000  . TOTAL MASTECTOMY Left 05/17/2015   Procedure: LEFT TOTAL MASTECTOMY;  Surgeon: Rolm Bookbinder, MD;  Location: [place];  Service: General;  Laterality: Left;  . TUMOR EXCISION  1981   removal of Hodgkins lymphoma    Family  History  Problem Relation Age of Onset  . Heart attack Mother        d.61  . Heart disease Mother   . Heart attack Father        d.60  . Diabetes Father   . Heart disease Father   . Lung cancer Sister        d.61 metastases to brain. History of smoking. Maternal half-sister.  . Cancer Sister 30       unspecified gynecologic cancer (either cervical or ovarian) in her 53s.  Marland Kitchen Heart attack Maternal Grandmother   . Cancer Maternal Grandfather 35       d.52s oral cancer  . Hypertension Brother   . Glaucoma Brother   .  Breast cancer Sister 66       d.42 from metastatic disease. Paternal half-sister.  . Colon cancer Neg Hx   . Stomach cancer Neg Hx      Social History   Socioeconomic History  . Marital status: Married    Spouse name: None  . Number of children: 2  . Years of education: None  . Highest education level: None  Social Needs  . Financial resource strain: None  . Food insecurity - worry: None  . Food insecurity - inability: None  . Transportation needs - medical: None  . Transportation needs - non-medical: None  Occupational History  . Occupation: retired    Comment: elementary principal  Tobacco Use  . Smoking status: Never Smoker  . Smokeless tobacco: Never Used  Substance and Sexual Activity  . Alcohol use: No    Alcohol/week: 0.0 oz  . Drug use: No  . Sexual activity: Yes  Other Topics Concern  . None  Social History Narrative   Retired [employer] principal, married    Started disability September 2013      Has one daughter in [place], works at [employer]. Has a son, Aaron Edelman, who is Retail buyer.   Eats all food groups.   Wear seatbelt.    Attends church. [place], [place]. [place].    Current Outpatient Medications on File Prior to Visit  Medication Sig Dispense Refill  . ALPRAZolam (XANAX) 0.5 MG tablet Take 0.5 mg by mouth at bedtime. May take additional dose daily for anxiety    . amLODipine (NORVASC) 5 MG tablet Take 0.5 tablets (2.5 mg total)  by mouth daily. 45 tablet 3  . APIDRA 100 UNIT/ML injection Inject 46 Units into the skin as directed. INFUSE THROUGH INSULIN PUMP UTD::[place] 46.15 units daily, [place] depends on carb intake  2  . aspirin EC 81 MG tablet Take 81 mg by mouth at bedtime.    Marland Kitchen azithromycin (ZITHROMAX) 500 MG tablet 1 tablet by mouth one hour prior to dental appointment/procedure 2 tablet 0  . Calcium Carbonate-Vitamin D (RA CALCIUM PLUS VITAMIN D) 600-400 MG-UNIT per tablet Take 1 tablet by mouth daily.     . Cholecalciferol 2000 UNITS TABS Take 1 tablet by mouth every morning.     . Coenzyme Q10 (CO Q 10) 100 MG CAPS Take 1 capsule by mouth at bedtime.     . colchicine 0.6 MG tablet Take 0.6 mg by mouth at bedtime. And,Take 1 tablet at onset and repeate in 1 hour and then 1 daily x 2 (FOR FLARES)     . docusate sodium (COLACE) 100 MG capsule Take 100 mg by mouth at bedtime.    . Fe Fum-FA-B Cmp-C-Zn-Mg-Mn-Cu (HEMATINIC PLUS COMPLEX) 106-1 MG TABS Take 1 tablet by mouth every Monday, Wednesday, and Friday. Take 1 tablet oraly three times a week or as directed daily with with a Vitamin C tablet 90 each 0  . glucagon (GLUCAGON EMERGENCY) 1 MG injection Inject 1 mg into the vein once as needed (for severe reaction). Reported on 12/05/2015    . isosorbide mononitrate (IMDUR) 120 MG 24 hr tablet Take 120 mg by mouth daily.    Marland Kitchen latanoprost (XALATAN) 0.005 % ophthalmic solution Place 1 drop into both eyes at bedtime.    . Magnesium Oxide 250 MG TABS Take 250 mg by mouth at bedtime.     . metolazone (ZAROXOLYN) 2.5 MG tablet Take 2.5 mg (1 tab) once as directed by CHF clinic. Call before taking (  [phone] 3 tablet 0  . metoprolol tartrate (LOPRESSOR) 50 MG tablet TAKE 1 AND 1/2 TABLETS(75 MG) BY MOUTH TWICE DAILY 270 tablet 0  . Multiple Vitamins-Minerals (MULTIVITAMIN WITH MINERALS) tablet Take 1 tablet by mouth every morning.     . nitroGLYCERIN (NITROLINGUAL) 0.4 MG/SPRAY spray Place 1 spray under the tongue every 5  (five) minutes as needed for chest pain. 12 g 1  . pantoprazole (PROTONIX) 40 MG tablet Take 1 tablet (40 mg total) by mouth daily. 30 tablet 11  . Polyethyl Glycol-Propyl Glycol (SYSTANE) 0.4-0.3 % SOLN Apply 1 drop to eye daily as needed (dry eyes).    . potassium chloride (MICRO-K) 10 MEQ CR capsule Take 4 capsules (40 mEq total) by mouth 2 (two) times daily. 240 capsule 3  . PRALUENT 150 MG/ML SOPN INJECT 1 PEN INTO THE SKIN EVERY 14 (FOURTEEN) DAYS. 2 pen 3  . pravastatin (PRAVACHOL) 10 MG tablet TAKE 1 TABLET BY MOUTH ONCE DAILY 90 tablet 3  . spironolactone (ALDACTONE) 25 MG tablet Take 0.5 tablets (12.5 mg total) by mouth daily. 15 tablet 3  . ticagrelor (BRILINTA) 60 MG TABS tablet TAKE 1 TABLET(60 MG) BY MOUTH TWICE DAILY 180 tablet 2  . torsemide (DEMADEX) 20 MG tablet Take 80 mg (4 tabs) in AM, then 60 mg (3 tabs) in PM 210 tablet 6  . triamcinolone cream (KENALOG) 0.5 % Apply 1 application topically daily as needed (inflamation).     Marland Kitchen ULORIC 40 MG tablet Take 40 mg by mouth at bedtime.   11  . vitamin C (ASCORBIC ACID) 500 MG tablet Take 500 mg by mouth 3 (three) times a week.    Marland Kitchen amLODipine (NORVASC) 5 MG tablet Take 0.5 tablets (2.5 mg total) by mouth daily. (Patient not taking: Reported on 08/13/2017) 15 tablet 3  . clobetasol (OLUX) 0.05 % topical foam Apply 1 application topically daily.  0  . isosorbide mononitrate (IMDUR) 60 MG 24 hr tablet Take 2 tablets (120 mg total) by mouth daily. (Patient not taking: Reported on 08/13/2017) 60 tablet 3  . potassium chloride (K-DUR,KLOR-CON) 10 MEQ tablet TAKE 6 CAPSULES(60 MEQ) BY MOUTH TWICE DAILY (Patient not taking: Reported on 08/13/2017) 360 tablet 6   No current facility-administered medications on file prior to visit.     Review of Systems  Constitutional: Negative for activity change, appetite change and fever.  HENT: Negative for congestion, ear discharge, ear pain, hearing loss, nosebleeds, sinus pressure, sinus pain, sore  throat, tinnitus, trouble swallowing and voice change.   Eyes: Negative for visual disturbance.  Respiratory: Negative for cough, chest tightness and wheezing.        Patient reports that she does have baseline dyspnea on exertion.  Reports she is currently in pulmonary rehab for this issue.  Cardiovascular: Negative for chest pain, palpitations and leg swelling.  Gastrointestinal: Negative for abdominal pain, anal bleeding, nausea, rectal pain and vomiting.  Genitourinary: Negative for dysuria, frequency, hematuria, urgency, vaginal bleeding and vaginal pain.  Skin: Negative for rash.  Neurological: Negative for dizziness, syncope and light-headedness.  Hematological: Negative for adenopathy. Does not bruise/bleed easily.  Psychiatric/Behavioral: Negative for decreased concentration, dysphoric mood, hallucinations and sleep disturbance. The patient is not hyperactive.      Objective:   BP 130/74 (BP Location: Left Arm, Patient Position: Sitting, Cuff Size: Normal)   Pulse 75   Temp 98.4 F (36.9 C) (Other (Comment))   Resp 16   Ht 5' 2"  (1.575 m)   Wt 117 lb  12 oz (53.4 kg)   SpO2 100%   BMI 21.54 kg/m   Physical Exam  Constitutional: She is oriented to person, place, and time. She appears well-developed and well-nourished.  HENT:  Head: Normocephalic and atraumatic.  Right Ear: Hearing, tympanic membrane and external ear normal. No drainage, swelling or tenderness. No foreign bodies. No hemotympanum.  Left Ear: Hearing, tympanic membrane, external ear and ear canal normal.  Nose: Nose normal.  Mouth/Throat: Oropharynx is clear and moist. No oropharyngeal exudate.  Right ear canal with evidence of dried blood in canal.  No visualized lesion or source of bleeding and canal.  No active bleeding.  Eyes: Conjunctivae and EOM are normal. Pupils are equal, round, and reactive to light. Right eye exhibits no discharge. No scleral icterus.  Neck: Normal range of motion. Neck supple. No  JVD present. No tracheal deviation present. No thyromegaly present.  Cardiovascular: Normal rate and regular rhythm.  Murmur heard. Pulmonary/Chest: Effort normal and breath sounds normal.  Abdominal: Soft. Bowel sounds are normal.  Insulin pump in place.  No surrounding bruising or ecchymosis.  Musculoskeletal: Normal range of motion.  Lymphadenopathy:    She has no cervical adenopathy.  Neurological: She is alert and oriented to person, place, and time. No cranial nerve deficit.  Skin: Skin is warm and dry.  No petechiae or bruising on skin throughout.  Patient with 3-4 erythematous pustular satellite candidal lesions under her left axilla near surgical scar.  Psychiatric: She has a normal mood and affect. Her behavior is normal. Thought content normal.  Vitals reviewed.    Assessment and Plan  1. Bleeding from ear, right -Discussed with patient today that the most likely diagnosis is that she has a small abrasion or lesion in her ear canal which is oozing blood which pulls during the night.  She could be scratching or rubbing the area during her sleep which is further inciting the problem.  At this time I will send her to ear nose and throat for them to look at the area under the microscope and cauterize the affected area as needed.  She was counseled concerning worrisome signs and symptoms of bleeding.  She was reassured today that her eardrum and the rest of her ear was within normal limits and suspect that this is an issue related to her skin and the fact that she is on Brilinta. - CBC with Differential - Ambulatory referral to ENT  2. High risk medication use   3. Candidal skin infection Apply cream to area as directed daily.  Counseled on skin care precautions. - terconazole (TERAZOL 7) 0.4 % vaginal cream; Place 1 applicator vaginally at bedtime.  Dispense: 45 g; Refill: 0  4. History of breast cancer Referred to Dr. Talbert Cage.  - Ambulatory referral to Hematology / Oncology  5.  History of renal cell carcinoma  - Ambulatory referral to Hematology / Oncology  6. History of Hodgkin's lymphoma  - Ambulatory referral to Hematology / Oncology  7. Thrombocytopenia ([place]) She has a long history of thrombocytopenia and has been followed by hematology oncology secondary to this and her history of multiple cancers.  Will place referral at this time.  In addition check her platelet count secondary to her bleeding. - Ambulatory referral to Hematology / Oncology  8. Anxiety Stable.  Refill Xanax as needed.  Patient aware of the risks of long-term use of this medication and agrees not to escalate the use.  Counseled regarding sedation precautions of this medication.Patient counseled in  detail regarding the risks of medication. Told to call or return to clinic if develop any worrisome signs or symptoms. Patient voiced understanding.   9. Hypertension, unspecified type Stable.  Continue follow-up with cardiology and nephrology.  Continue management of multiple cardiac issues.  Return in about 3 months (around 11/11/2017) for Follow-up. [name], MD 08/13/2017

## 2017-08-13 NOTE — Progress Notes (Signed)
Daily Session Note  Patient Details  Name: [name] [name] MRN: [id number] Date of Birth: 08/10/1950 Referring Provider:     PULMONARY REHAB OTHER RESP ORIENTATION from 05/24/2017 in [place]  Referring Provider  Dr. Aundra Dubin      Encounter Date: 08/13/2017  Check In: Session Check In - 08/13/17 1045      Check-In   Location  [facility]    Staff Present  Mainor Hellmann Angelina Pih, MS, EP, [facility], Exercise Physiologist;Gregory Luther Parody, BS, EP, Exercise Physiologist    Supervising physician immediately available to respond to emergencies  See telemetry face sheet for immediately available MD    Medication changes reported      No    Fall or balance concerns reported     No    Tobacco Cessation  No Change    Warm-up and Cool-down  Performed as group-led instruction    Resistance Training Performed  Yes    VAD Patient?  No      Pain Assessment   Currently in Pain?  No/denies    Pain Score  0-No pain    Multiple Pain Sites  No       Capillary Blood Glucose: No results found. However, due to the size of the patient record, not all encounters were searched. Please check Results Review for a complete set of results.    Social History   Tobacco Use  Smoking Status Never Smoker  Smokeless Tobacco Never Used    Goals Met:  Proper associated with RPD/PD & O2 Sat Independence with exercise equipment Improved SOB with ADL's Exercise tolerated well No report of cardiac concerns or symptoms Strength training completed today  Goals Unmet:  Not Applicable  Comments: Check out: 11:45   Dr. Sinda Du is Medical Director for [facility].

## 2017-08-14 ENCOUNTER — Telehealth: Payer: Self-pay | Admitting: Family Medicine

## 2017-08-14 DIAGNOSIS — D696 Thrombocytopenia, unspecified: Secondary | ICD-10-CM

## 2017-08-14 DIAGNOSIS — D649 Anemia, unspecified: Secondary | ICD-10-CM

## 2017-08-14 NOTE — Telephone Encounter (Signed)
Patient informed of message below, verbalized understanding.  

## 2017-08-14 NOTE — Telephone Encounter (Signed)
Patient left message on nurse line asking about appointment with Dr. Benjamine Mola. I will address this.  She also states she know Dr. Mannie Stabile was sending her to see Dr. Talbert Cage, but she got a call yesterday from [place] to schedule her with Dr. Sherrine Maples. She asks if that is who she is supposed to see. She asked Oncology yesterday why she wasn't seeing Talbert Cage but she states that they could not answer that. She states she is okay going to see Dr. Talbert Cage in [place] if necessary because se has other doctors in [place].  Please advise on oncology.

## 2017-08-14 NOTE — Telephone Encounter (Signed)
-----   Message from [name], MD sent at 08/14/2017  2:44 PM EST ----- Please call patient and advise her that her platelet count is lower than it was 1 month ago.  Her platelet count is 108.  Advised her that we will want to recheck this in 2 weeks or sooner if she is getting any rashes/petechiae/bleeding.  Advised her of her appointment tomorrow with ENT.  Advised her that I am working on getting her in oncology/hematology appointment and will we will be in touch with her in the next couple days.

## 2017-08-14 NOTE — Telephone Encounter (Signed)
Please update this note per your discussion with me and patient.

## 2017-08-15 ENCOUNTER — Encounter (HOSPITAL_COMMUNITY)
Admission: RE | Admit: 2017-08-15 | Discharge: 2017-08-15 | Disposition: A | Discharge status: Home / Self Care | Payer: Medicare Other | Source: Ambulatory Visit | Attending: Cardiology | Admitting: Cardiology

## 2017-08-15 ENCOUNTER — Ambulatory Visit (INDEPENDENT_AMBULATORY_CARE_PROVIDER_SITE_OTHER): Payer: Medicare Other | Admitting: Otolaryngology

## 2017-08-15 DIAGNOSIS — I5032 Chronic diastolic (congestive) heart failure: Secondary | ICD-10-CM

## 2017-08-15 DIAGNOSIS — H6121 Impacted cerumen, right ear: Secondary | ICD-10-CM

## 2017-08-15 DIAGNOSIS — S00411A Abrasion of right ear, initial encounter: Secondary | ICD-10-CM

## 2017-08-15 NOTE — Telephone Encounter (Signed)
I spoke to patient and informed her that Dr. Mannie Stabile was still researching oncologists and that we would be in touch with her and keep her informed. I advised her not to cancel the appointment she has now. I advised her of her appointment with Dr. Benjamine Mola today. She verbalized understanding.

## 2017-08-15 NOTE — Progress Notes (Signed)
Daily Session Note  Patient Details  Name: [name] [name] MRN: [id number] Date of Birth: 1950-11-13 Referring Provider:     PULMONARY REHAB OTHER RESP ORIENTATION from 05/24/2017 in [place]  Referring Provider  Dr. Aundra Dubin      Encounter Date: 08/15/2017  Check In: Session Check In - 08/15/17 1045      Check-In   Location  [facility]    Staff Present  Diane Angelina Pih, MS, EP, [facility], Exercise Physiologist;Gregory Luther Parody, BS, EP, Exercise Physiologist    Supervising physician immediately available to respond to emergencies  See telemetry face sheet for immediately available MD    Medication changes reported      No    Fall or balance concerns reported     No    Warm-up and Cool-down  Performed as group-led instruction    Resistance Training Performed  Yes    VAD Patient?  No      Pain Assessment   Currently in Pain?  No/denies    Pain Score  0-No pain    Multiple Pain Sites  No       Capillary Blood Glucose: No results found. However, due to the size of the patient record, not all encounters were searched. Please check Results Review for a complete set of results.    Social History   Tobacco Use  Smoking Status Never Smoker  Smokeless Tobacco Never Used    Goals Met:  Proper associated with RPD/PD & O2 Sat Independence with exercise equipment Improved SOB with ADL's Using PLB without cueing & demonstrates good technique Exercise tolerated well No report of cardiac concerns or symptoms Strength training completed today  Goals Unmet:  Not Applicable  Comments: Check out 1145.   Dr. Sinda Du is Medical Director for [facility] Pul[facility] Re[facility].

## 2017-08-18 ENCOUNTER — Encounter (HOSPITAL_COMMUNITY): Payer: Self-pay | Admitting: Cardiology

## 2017-08-19 ENCOUNTER — Telehealth (HOSPITAL_COMMUNITY): Payer: Self-pay

## 2017-08-19 ENCOUNTER — Ambulatory Visit (INDEPENDENT_AMBULATORY_CARE_PROVIDER_SITE_OTHER): Payer: Medicare Other | Admitting: *Deleted

## 2017-08-19 DIAGNOSIS — I495 Sick sinus syndrome: Secondary | ICD-10-CM

## 2017-08-19 NOTE — Progress Notes (Signed)
Remote pacemaker transmission.   

## 2017-08-19 NOTE — Telephone Encounter (Signed)
This was sent to [facility] supple per Doroteo Bradford for prior auth. Pt aware     ----- Message from Mychart, Generic sent at 08/18/2017 8:20 PM EST -----    I am trying to get preauthorization for my Praluent 135m/ml Pen (2/box).   [facility] ([phone] first request on August 08, 2017.  Dr. DGlori Bickerslast refilled this perscription. (I'm guessing you were out of the office.) It still has refills but needs a new authorization because it is a new plan year. The pharmacy reached out to me since they had not heard from your office.  I left a message on the nurse triage line both Thursday and Friday of last week. No one has called me back.  [place]orey [place] said [place] you on Friday afternoon.  I have used my last injection pen. Please help.  Thank you,  [name] RosenDOB [birth year]

## 2017-08-20 ENCOUNTER — Telehealth: Payer: Self-pay | Admitting: Family Medicine

## 2017-08-20 ENCOUNTER — Encounter (HOSPITAL_COMMUNITY)
Admission: RE | Admit: 2017-08-20 | Discharge: 2017-08-20 | Disposition: A | Discharge status: Home / Self Care | Payer: Medicare Other | Source: Ambulatory Visit | Attending: Cardiology | Admitting: Cardiology

## 2017-08-20 DIAGNOSIS — D696 Thrombocytopenia, unspecified: Secondary | ICD-10-CM

## 2017-08-20 DIAGNOSIS — I5032 Chronic diastolic (congestive) heart failure: Secondary | ICD-10-CM

## 2017-08-20 NOTE — Telephone Encounter (Signed)
Please call patient and advise that the heme/onc doctor that I wanted her to see has moved to outside of [place]. The new MD that is going to run the [facility] starts in the spring, April. I have heard wonderful things about him. I would like for her to come back to the lab and recheck her platelets. If they have stabilized and are back to her usual range where she runs, I think that we can follow her platelets and have her see the new MD in April. If her platelets are lower then I will have her go ahead and see the heme/onc doctor and then establish care with the new doctor/director at [facility].   Order placed for CBC.

## 2017-08-20 NOTE — Progress Notes (Signed)
Daily Session Note  Patient Details  Name: [name] [name] MRN: [id number] Date of Birth: 04/18/1951 Referring Provider:     PULMONARY REHAB OTHER RESP ORIENTATION from 05/24/2017 in [place]  Referring Provider  Dr. Aundra Dubin      Encounter Date: 08/20/2017  Check In: Session Check In - 08/20/17 1037      Check-In   Location  [facility]    Staff Present  Diane Angelina Pih, MS, EP, [facility], Exercise Physiologist;Laverna Dossett Luther Parody, BS, EP, Exercise Physiologist    Supervising physician immediately available to respond to emergencies  See telemetry face sheet for immediately available MD    Medication changes reported      No    Fall or balance concerns reported     No    Warm-up and Cool-down  Performed as group-led instruction    Resistance Training Performed  Yes    VAD Patient?  No      Pain Assessment   Currently in Pain?  No/denies    Pain Score  0-No pain    Multiple Pain Sites  No       Capillary Blood Glucose: No results found. However, due to the size of the patient record, not all encounters were searched. Please check Results Review for a complete set of results.    Social History   Tobacco Use  Smoking Status Never Smoker  Smokeless Tobacco Never Used    Goals Met:  Independence with exercise equipment Improved SOB with ADL's Using PLB without cueing & demonstrates good technique Exercise tolerated well No report of cardiac concerns or symptoms Strength training completed today  Goals Unmet:  Not Applicable  Comments: Check out 1145   Dr. Sinda Du is Medical Director for [facility] Pulmonary Re[facility].

## 2017-08-20 NOTE — Telephone Encounter (Signed)
Patient informed of message below, verbalized understanding. She plans to go Monday for labs so that Dr. Mannie Stabile can get results and advised prior to her appointment at [facility] on 8th. She wishes to be able to cancel within 48 hr time frame to avoid no show charge.

## 2017-08-21 ENCOUNTER — Encounter: Payer: Self-pay | Admitting: Cardiology

## 2017-08-21 LAB — CUP PACEART REMOTE DEVICE CHECK
Brady Statistic AP VP Percent: 0 %
Brady Statistic AS VP Percent: 0 %
Brady Statistic AS VS Percent: 100 %
Date Time Interrogation Session: 20190128143613
Implantable Lead Location: 753859
Implantable Lead Location: 753860
Lead Channel Impedance Value: 653 Ohm
Lead Channel Pacing Threshold Amplitude: 0.375 V
Lead Channel Pacing Threshold Amplitude: 0.875 V
Lead Channel Pacing Threshold Pulse Width: 0.4 ms
Lead Channel Sensing Intrinsic Amplitude: 16 mV
Lead Channel Setting Pacing Pulse Width: 0.4 ms
Lead Channel Setting Sensing Sensitivity: 5.6 mV
MDC IDC LEAD IMPLANT DT: 20140130
MDC IDC LEAD IMPLANT DT: 20140130
MDC IDC MSMT BATTERY IMPEDANCE: 234 Ohm
MDC IDC MSMT BATTERY REMAINING LONGEVITY: 142 mo
MDC IDC MSMT BATTERY VOLTAGE: 2.8 V
MDC IDC MSMT LEADCHNL RA IMPEDANCE VALUE: 487 Ohm
MDC IDC MSMT LEADCHNL RA SENSING INTR AMPL: 1 mV
MDC IDC MSMT LEADCHNL RV PACING THRESHOLD PULSEWIDTH: 0.4 ms
MDC IDC PG IMPLANT DT: 20140130
MDC IDC SET LEADCHNL RA PACING AMPLITUDE: 1.75 V
MDC IDC SET LEADCHNL RV PACING AMPLITUDE: 2.5 V
MDC IDC STAT BRADY AP VS PERCENT: 0 %

## 2017-08-22 ENCOUNTER — Encounter (HOSPITAL_COMMUNITY)
Admission: RE | Admit: 2017-08-22 | Discharge: 2017-08-22 | Disposition: A | Discharge status: Home / Self Care | Payer: Medicare Other | Source: Ambulatory Visit | Attending: Cardiology | Admitting: Cardiology

## 2017-08-22 ENCOUNTER — Encounter (HOSPITAL_COMMUNITY): Payer: Self-pay | Admitting: Cardiology

## 2017-08-22 ENCOUNTER — Other Ambulatory Visit: Payer: Self-pay | Admitting: Internal Medicine

## 2017-08-22 DIAGNOSIS — I5032 Chronic diastolic (congestive) heart failure: Secondary | ICD-10-CM | POA: Diagnosis not present

## 2017-08-22 NOTE — Progress Notes (Signed)
Daily Session Note  Patient Details  Name: [name] [name] MRN: [id number] Date of Birth: [birth year] Referring Provider:     PULMONARY REHAB OTHER RESP ORIENTATION from 05/24/2017 in [place]  Referring Provider  Dr. Aundra Dubin      Encounter Date: 08/22/2017  Check In: Session Check In - 08/22/17 1403      Check-In   Location  [facility]    Staff Present  Diane Angelina Pih, MS, EP, [facility], Exercise Physiologist;Debra Wynetta Emery, RN, BSN;Magon Croson, BS, EP, Exercise Physiologist    Supervising physician immediately available to respond to emergencies  See telemetry face sheet for immediately available MD    Medication changes reported      No    Fall or balance concerns reported     No    Warm-up and Cool-down  Performed as group-led instruction    Resistance Training Performed  Yes    VAD Patient?  No      Pain Assessment   Currently in Pain?  No/denies    Pain Score  0-No pain    Multiple Pain Sites  No       Capillary Blood Glucose: No results found. However, due to the size of the patient record, not all encounters were searched. Please check Results Review for a complete set of results.    Social History   Tobacco Use  Smoking Status Never Smoker  Smokeless Tobacco Never Used    Goals Met:  Independence with exercise equipment Improved SOB with ADL's Using PLB without cueing & demonstrates good technique Exercise tolerated well No report of cardiac concerns or symptoms Strength training completed today  Goals Unmet:  Not Applicable  Comments: Check out 230   Dr. Sinda Du is Medical Director for [facility] Pulmonary Rehab.

## 2017-08-23 LAB — CBC WITH DIFFERENTIAL/PLATELET
BASOS ABS: 77 {cells}/uL (ref 0–200)
BASOS PCT: 1.1 %
EOS ABS: 182 {cells}/uL (ref 15–500)
Eosinophils Relative: 2.6 %
HEMATOCRIT: 37.8 % (ref 35.0–45.0)
Hemoglobin: 12.9 g/dL (ref 11.7–15.5)
LYMPHS ABS: 1596 {cells}/uL (ref 850–3900)
MCH: 30.6 pg (ref 27.0–33.0)
MCHC: 34.1 g/dL (ref 32.0–36.0)
MCV: 89.8 fL (ref 80.0–100.0)
MPV: 12.1 fL (ref 7.5–12.5)
Monocytes Relative: 8.5 %
NEUTROS ABS: 4550 {cells}/uL (ref 1500–7800)
Neutrophils Relative %: 65 %
PLATELETS: 113 10*3/uL — AB (ref 140–400)
RBC: 4.21 10*6/uL (ref 3.80–5.10)
RDW: 13.7 % (ref 11.0–15.0)
Total Lymphocyte: 22.8 %
WBC mixed population: 595 cells/uL (ref 200–950)
WBC: 7 10*3/uL (ref 3.8–10.8)

## 2017-08-23 MED ORDER — ALIROCUMAB 150 MG/ML ~~LOC~~ SOPN: 1.0000 "pen " | PEN_INJECTOR | SUBCUTANEOUS | 11 refills | Status: DC

## 2017-08-23 NOTE — Telephone Encounter (Signed)
Refill for Praluent 135m/mL has been sent to [facility] Pt is aware.

## 2017-08-26 ENCOUNTER — Encounter: Payer: Self-pay | Admitting: Family Medicine

## 2017-08-27 ENCOUNTER — Encounter (HOSPITAL_COMMUNITY)
Admission: RE | Admit: 2017-08-27 | Discharge: 2017-08-27 | Disposition: A | Discharge status: Home / Self Care | Payer: Medicare Other | Source: Ambulatory Visit | Attending: Cardiology | Admitting: Cardiology

## 2017-08-27 DIAGNOSIS — I5032 Chronic diastolic (congestive) heart failure: Secondary | ICD-10-CM | POA: Diagnosis present

## 2017-08-27 NOTE — Progress Notes (Signed)
Daily Session Note  Patient Details  Name: [name] [name] MRN: [id number] Date of Birth: [birth year] Referring Provider:     PULMONARY REHAB OTHER RESP ORIENTATION from 05/24/2017 in [place]  Referring Provider  Dr. Aundra Dubin      Encounter Date: 08/27/2017  Check In: Session Check In - 08/27/17 1100      Check-In   Staff Present  Sidi Dzikowski Angelina Pih, MS, EP, [facility], Exercise Physiologist;Gregory Luther Parody, BS, EP, Exercise Physiologist    Supervising physician immediately available to respond to emergencies  See telemetry face sheet for immediately available MD    Medication changes reported      No    Fall or balance concerns reported     No    Tobacco Cessation  No Change    Warm-up and Cool-down  Performed as group-led instruction    Resistance Training Performed  Yes    VAD Patient?  No      Pain Assessment   Currently in Pain?  No/denies    Multiple Pain Sites  No       Capillary Blood Glucose: No results found. However, due to the size of the patient record, not all encounters were searched. Please check Results Review for a complete set of results.    Social History   Tobacco Use  Smoking Status Never Smoker  Smokeless Tobacco Never Used    Goals Met:  Improved SOB with ADL's Exercise tolerated well No report of cardiac concerns or symptoms Strength training completed today  Goals Unmet:  Not Applicable  Comments: Check out: 1200   Dr. Sinda Du is Medical Director for [facility] Pulmonary Rehab.

## 2017-08-29 ENCOUNTER — Encounter (HOSPITAL_COMMUNITY)
Admission: RE | Admit: 2017-08-29 | Discharge: 2017-08-29 | Disposition: A | Discharge status: Home / Self Care | Payer: Medicare Other | Source: Ambulatory Visit | Attending: Cardiology | Admitting: Cardiology

## 2017-08-29 DIAGNOSIS — I5032 Chronic diastolic (congestive) heart failure: Secondary | ICD-10-CM

## 2017-08-29 NOTE — Progress Notes (Signed)
Daily Session Note  Patient Details  Name: LAHNA NATH MRN: [id number] Date of Birth: 1951-05-02 Referring Provider:     PULMONARY REHAB OTHER RESP ORIENTATION from 05/24/2017 in [place]  Referring Provider  Dr. Aundra Dubin      Encounter Date: 08/29/2017  Check In: Session Check In - 08/29/17 1045      Check-In   Location  [facility]    Staff Present  Diane Angelina Pih, MS, EP, [facility], Exercise Physiologist;Gregory Luther Parody, BS, EP, Exercise Physiologist    Supervising physician immediately available to respond to emergencies  See telemetry face sheet for immediately available MD    Medication changes reported      No    Fall or balance concerns reported     No    Warm-up and Cool-down  Performed as group-led instruction    Resistance Training Performed  Yes    VAD Patient?  No      Pain Assessment   Currently in Pain?  No/denies    Pain Score  0-No pain    Multiple Pain Sites  No       Capillary Blood Glucose: No results found. However, due to the size of the patient record, not all encounters were searched. Please check Results Review for a complete set of results.    Social History   Tobacco Use  Smoking Status Never Smoker  Smokeless Tobacco Never Used    Goals Met:  Proper associated with RPD/PD & O2 Sat Independence with exercise equipment Improved SOB with ADL's Using PLB without cueing & demonstrates good technique Exercise tolerated well No report of cardiac concerns or symptoms Strength training completed today  Goals Unmet:  Not Applicable  Comments: Check out 1145.   Dr. Sinda Du is Medical Director for [facility] Pulmonary Rehab.

## 2017-08-30 ENCOUNTER — Ambulatory Visit (HOSPITAL_COMMUNITY): Payer: Medicare Other | Admitting: Internal Medicine

## 2017-09-03 ENCOUNTER — Encounter (HOSPITAL_COMMUNITY)
Admission: RE | Admit: 2017-09-03 | Discharge: 2017-09-03 | Disposition: A | Discharge status: Home / Self Care | Payer: Medicare Other | Source: Ambulatory Visit | Attending: Cardiology | Admitting: Cardiology

## 2017-09-03 VITALS — Ht 63.0 in | Wt 118.2 lb

## 2017-09-03 DIAGNOSIS — I5032 Chronic diastolic (congestive) heart failure: Secondary | ICD-10-CM

## 2017-09-03 NOTE — Addendum Note (Signed)
Encounter addended by: Suzanne Boron on: 09/03/2017 8:16 AM  Actions taken: Flowsheet data copied forward, Flowsheet accepted, Visit Navigator Flowsheet section accepted

## 2017-09-03 NOTE — Progress Notes (Signed)
Daily Session Note  Patient Details  Name: [name] [name] MRN: [id number] Date of Birth: [birth year] Referring Provider:     PULMONARY REHAB OTHER RESP ORIENTATION from 05/24/2017 in [place]  Referring Provider  Dr. Aundra Dubin      Encounter Date: 09/03/2017  Check In: Session Check In - 09/03/17 1102      Check-In   Location  [facility]    Staff Present  Diane Angelina Pih, MS, EP, [facility], Exercise Physiologist;Kylian Loh Luther Parody, BS, EP, Exercise Physiologist    Supervising physician immediately available to respond to emergencies  See telemetry face sheet for immediately available MD    Medication changes reported      No    Fall or balance concerns reported     No    Warm-up and Cool-down  Performed as group-led instruction    Resistance Training Performed  Yes    VAD Patient?  No      Pain Assessment   Currently in Pain?  No/denies    Pain Score  0-No pain    Multiple Pain Sites  No       Capillary Blood Glucose: No results found. However, due to the size of the patient record, not all encounters were searched. Please check Results Review for a complete set of results.  Exercise Prescription Changes - 09/03/17 0800      Response to Exercise   Blood Pressure (Admit)  106/60    Blood Pressure (Exercise)  126/68    Blood Pressure (Exit)  116/50    Heart Rate (Admit)  73 bpm    Heart Rate (Exercise)  87 bpm    Heart Rate (Exit)  82 bpm    Oxygen Saturation (Admit)  96 %    Oxygen Saturation (Exercise)  92 %    Oxygen Saturation (Exit)  100 %    Rating of Perceived Exertion (Exercise)  13    Perceived Dyspnea (Exercise)  13    Duration  Progress to 30 minutes of  aerobic without signs/symptoms of physical distress    Intensity  THRR New [id number]      Progression   Progression  Continue to progress workloads to maintain intensity without signs/symptoms of physical distress.      Resistance Training   Training Prescription  Yes    Weight  1    Reps  10-15      Treadmill   MPH  1.9    Grade  0    Minutes  15    METs  2.4      NuStep   Level  2    SPM  110    Minutes  20    METs  1.6      Home Exercise Plan   Plans to continue exercise at  Home (comment)    Frequency  Add 2 additional days to program exercise sessions.    Initial Home Exercises Provided  05/28/17       Social History   Tobacco Use  Smoking Status Never Smoker  Smokeless Tobacco Never Used    Goals Met:  Independence with exercise equipment Improved SOB with ADL's Using PLB without cueing & demonstrates good technique Exercise tolerated well No report of cardiac concerns or symptoms Strength training completed today  Goals Unmet:  Not Applicable  Comments: Check out 1145   Dr. Sinda Du is Medical Director for [facility] Pulmonary Rehab.

## 2017-09-05 ENCOUNTER — Ambulatory Visit (INDEPENDENT_AMBULATORY_CARE_PROVIDER_SITE_OTHER): Payer: Medicare Other | Admitting: Family Medicine

## 2017-09-05 ENCOUNTER — Other Ambulatory Visit (HOSPITAL_COMMUNITY)
Admission: RE | Admit: 2017-09-05 | Discharge: 2017-09-05 | Disposition: A | Discharge status: Home / Self Care | Payer: Medicare Other | Source: Ambulatory Visit | Attending: Family Medicine | Admitting: Family Medicine

## 2017-09-05 ENCOUNTER — Encounter (HOSPITAL_COMMUNITY): Payer: Medicare Other

## 2017-09-05 ENCOUNTER — Encounter: Payer: Self-pay | Admitting: Family Medicine

## 2017-09-05 ENCOUNTER — Other Ambulatory Visit: Payer: Self-pay

## 2017-09-05 VITALS — BP 122/84 | HR 77 | Temp 97.7°F | Resp 16 | Ht 62.0 in | Wt 118.5 lb

## 2017-09-05 DIAGNOSIS — D0512 Intraductal carcinoma in situ of left breast: Secondary | ICD-10-CM

## 2017-09-05 DIAGNOSIS — C649 Malignant neoplasm of unspecified kidney, except renal pelvis: Secondary | ICD-10-CM | POA: Diagnosis not present

## 2017-09-05 DIAGNOSIS — C8190 Hodgkin lymphoma, unspecified, unspecified site: Secondary | ICD-10-CM | POA: Diagnosis not present

## 2017-09-05 DIAGNOSIS — Z124 Encounter for screening for malignant neoplasm of cervix: Secondary | ICD-10-CM

## 2017-09-05 DIAGNOSIS — D696 Thrombocytopenia, unspecified: Secondary | ICD-10-CM

## 2017-09-05 DIAGNOSIS — Z1211 Encounter for screening for malignant neoplasm of colon: Secondary | ICD-10-CM

## 2017-09-05 DIAGNOSIS — Z114 Encounter for screening for human immunodeficiency virus [HIV]: Secondary | ICD-10-CM | POA: Diagnosis not present

## 2017-09-05 DIAGNOSIS — Z0000 Encounter for general adult medical examination without abnormal findings: Secondary | ICD-10-CM | POA: Diagnosis not present

## 2017-09-05 NOTE — Progress Notes (Signed)
Pulmonary Individual Treatment Plan  Patient Details  Name: [name] [name] MRN: [id number] Date of Birth: 06-26-1951 Referring Provider:     PULMONARY REHAB OTHER RESP ORIENTATION from 05/24/2017 in [place]  Referring Provider  Dr. Aundra Dubin      Initial Encounter Date:    [facility] from 05/24/2017 in [place]  Date  05/24/17  Referring Provider  Dr. Aundra Dubin      Visit Diagnosis: Diastolic CHF, chronic ([employer])  Patient's Home Medications on Admission:   Current Outpatient Medications:  .  Alirocumab (PRALUENT) 150 MG/ML SOPN, Inject 1 pen into the skin every 14 (fourteen) days., Disp: 2 pen, Rfl: 11 .  ALPRAZolam (XANAX) 0.5 MG tablet, Take 0.5 mg by mouth at bedtime. May take additional dose daily for anxiety, Disp: , Rfl:  .  amLODipine (NORVASC) 5 MG tablet, Take 0.5 tablets (2.5 mg total) by mouth daily., Disp: 45 tablet, Rfl: 3 .  amLODipine (NORVASC) 5 MG tablet, Take 0.5 tablets (2.5 mg total) by mouth daily., Disp: 15 tablet, Rfl: 3 .  APIDRA 100 UNIT/ML injection, Inject 46 Units into the skin as directed. INFUSE THROUGH INSULIN PUMP UTD::[place] 46.15 units daily, Bolus depends on carb intake, Disp: , Rfl: 2 .  aspirin EC 81 MG tablet, Take 81 mg by mouth at bedtime., Disp: , Rfl:  .  azithromycin (ZITHROMAX) 500 MG tablet, 1 tablet by mouth one hour prior to dental appointment/procedure, Disp: 2 tablet, Rfl: 0 .  Calcium Carbonate-Vitamin D (RA CALCIUM PLUS VITAMIN D) 600-400 MG-UNIT per tablet, Take 1 tablet by mouth daily. , Disp: , Rfl:  .  Cholecalciferol 2000 UNITS TABS, Take 1 tablet by mouth every morning. , Disp: , Rfl:  .  clobetasol (OLUX) 0.05 % topical foam, Apply 1 application topically daily., Disp: , Rfl: 0 .  Coenzyme Q10 (CO Q 10) 100 MG CAPS, Take 1 capsule by mouth at bedtime. , Disp: , Rfl:  .  colchicine 0.6 MG tablet, Take 0.6 mg by mouth at bedtime. And,Take 1 tablet at onset  and repeate in 1 hour and then 1 daily x 2 (FOR FLARES) , Disp: , Rfl:  .  docusate sodium (COLACE) 100 MG capsule, Take 100 mg by mouth at bedtime., Disp: , Rfl:  .  Fe Fum-FA-B Cmp-C-Zn-Mg-Mn-Cu (HEMATINIC PLUS COMPLEX) 106-1 MG TABS, Take 1 tablet by mouth every Monday, Wednesday, and Friday. Take 1 tablet oraly three times a week or as directed daily with with a Vitamin C tablet, Disp: 90 each, Rfl: 0 .  glucagon (GLUCAGON EMERGENCY) 1 MG injection, Inject 1 mg into the vein once as needed (for severe reaction). Reported on 12/05/2015, Disp: , Rfl:  .  isosorbide mononitrate (IMDUR) 120 MG 24 hr tablet, Take 120 mg by mouth daily., Disp: , Rfl:  .  isosorbide mononitrate (IMDUR) 60 MG 24 hr tablet, Take 2 tablets (120 mg total) by mouth daily., Disp: 60 tablet, Rfl: 3 .  latanoprost (XALATAN) 0.005 % ophthalmic solution, Place 1 drop into both eyes at bedtime., Disp: , Rfl:  .  Magnesium Oxide 250 MG TABS, Take 250 mg by mouth at bedtime. , Disp: , Rfl:  .  metolazone (ZAROXOLYN) 2.5 MG tablet, Take 2.5 mg (1 tab) once as directed by CHF clinic. Call before taking [phone], Disp: 3 tablet, Rfl: 0 .  metoprolol tartrate (LOPRESSOR) 50 MG tablet, TAKE 1 AND 1/2 TABLETS(75 MG) BY MOUTH TWICE DAILY, Disp: 270 tablet, Rfl: 0 .  Multiple Vitamins-Minerals (MULTIVITAMIN WITH MINERALS) tablet, Take 1 tablet by mouth every morning. , Disp: , Rfl:  .  nitroGLYCERIN (NITROLINGUAL) 0.4 MG/SPRAY spray, Place 1 spray under the tongue every 5 (five) minutes as needed for chest pain., Disp: 12 g, Rfl: 1 .  pantoprazole (PROTONIX) 40 MG tablet, Take 1 tablet (40 mg total) by mouth daily., Disp: 30 tablet, Rfl: 11 .  Polyethyl Glycol-Propyl Glycol (SYSTANE) 0.4-0.3 % SOLN, Apply 1 drop to eye daily as needed (dry eyes)., Disp: , Rfl:  .  potassium chloride (K-DUR,KLOR-CON) 10 MEQ tablet, TAKE 6 CAPSULES(60 MEQ) BY MOUTH TWICE DAILY, Disp: 360 tablet, Rfl: 6 .  potassium chloride (MICRO-K) 10 MEQ CR capsule,  Take 4 capsules (40 mEq total) by mouth 2 (two) times daily., Disp: 240 capsule, Rfl: 3 .  pravastatin (PRAVACHOL) 10 MG tablet, TAKE 1 TABLET BY MOUTH ONCE DAILY, Disp: 90 tablet, Rfl: 3 .  spironolactone (ALDACTONE) 25 MG tablet, Take 0.5 tablets (12.5 mg total) by mouth daily., Disp: 15 tablet, Rfl: 3 .  terconazole (TERAZOL 7) 0.4 % vaginal cream, Place 1 applicator vaginally at bedtime., Disp: 45 g, Rfl: 0 .  ticagrelor (BRILINTA) 60 MG TABS tablet, TAKE 1 TABLET(60 MG) BY MOUTH TWICE DAILY, Disp: 180 tablet, Rfl: 2 .  torsemide (DEMADEX) 20 MG tablet, Take 80 mg (4 tabs) in AM, then 60 mg (3 tabs) in PM, Disp: 210 tablet, Rfl: 6 .  triamcinolone cream (KENALOG) 0.5 %, Apply 1 application topically daily as needed (inflamation). , Disp: , Rfl:  .  ULORIC 40 MG tablet, Take 40 mg by mouth at bedtime. , Disp: , Rfl: 11 .  vitamin C (ASCORBIC ACID) 500 MG tablet, Take 500 mg by mouth 3 (three) times a week., Disp: , Rfl:   Past Medical History: Past Medical History:  Diagnosis Date  . Anxiety   . Aortic stenosis    a. bicuspid aortic valve; mean gradient of 20 mmHg in 2/10; b. s/p bioprosth AVR (19-mm Edwards pericardial valve) with a redo coronary artery bypass graft procedure in 07/2009; postoperative Dressler's syndrome    . Arthritis   . Breast cancer ([place])   . Carcinoma, renal cell ([place]) 05/2007   Laparoscopic right nephrectomy  . 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 Chronic diastolic CHF (congestive heart failure) ([place])    a. 9.2014 EchoP EF 60-65%, no rwma, bioprosth AVR, mean gradient of 86mHg, mild to mod MR, PASP 521mg.  . CKD (chronic kidney disease), stage II   . Colitis, ischemic ([facility]  . Coronary artery disease    a. initial CABG with SVG-RCA in 12/05. b. redo SVG-RCA and bioprosthetic AVR in 1/11. d. 9/14 - PCI with DES to distal LM/proximal LAD was done rather than bypass as she would have been a poor candidate for redo sternotomy. d. S/p DES to prox LM  05/2014.  . Diabetes mellitus 03/2010    TYPE II: Hemoglobin A1c of 7.4 in  03/2010; 8.4 in 06/2010; treated with insulin pump  . Duodenal ulcer    Remote; H. pylori positive  . Gastroparesis due to DM ([facility]/04/2012  . Genetic testing 01/14/2017   Ms. Ast underwent genetic counseling and testing for hereditary cancer syndromes on 01/01/2017. Her results were negative for mutations in all 83 genes analyzed by [facility]'s 83-gene Common Hereditary Cancers Panel. Genes analyzed include: ALK, APC, ATM, AXIN2, BAP1, BARD1, BLM, BMPR1A, BRCA1, BRCA2, BRIP1, CASR, CDC73, CDH1, CDK4, CDKN1B, CDKN1C, CDKN2A, CEBPA, CHEK2, CTNNA1, DICER1, DIS3L2,   .  Hodgkin's disease ([place]) 1991   Mantle radiation therapy  . Hodgkin's disease ([place])   . Hyperlipidemia   . Hyperplastic gastric polyp 06/23/2012  . Hypertension   . Iron deficiency anemia    a. 03/2013 EGD: essentially normal. Possible slow GIB.  . Migraines   . NASH (nonalcoholic steatohepatitis) 1999   -biopsy in 1999  . OSA on CPAP 04/24/2017   Moderate with AHI 22/hr now on CPAP at 18cm H2o  . Osteopenia     hip on DEXA in October 2007.  . Osteoporosis   . Oxygen deficiency   . Pacemaker    six sinus syndrome  . PAF (paroxysmal atrial fibrillation) ([place])    a.  h/o brief episodes noted on ppm interrogation. b. given GI bleeding and need to be on both ASA 81 and Brilinta, risks likely outweigh benefits of anticoagulation.   . Small bowel obstruction ([facility])    Recurrent; resolved after resection of a lipoma  . Syncope    a. H/o recurrent syncope with pauses on loop recorder s/p Medtronic pacemaker 07/2012.    Tobacco Use: Social History   Tobacco Use  Smoking Status Never Smoker  Smokeless Tobacco Never Used    Labs: Recent Review Flowsheet Data    Labs for ITP Cardiac and Pulmonary Rehab Latest Ref Rng & Units 11/04/2015 04/27/2016 04/19/2017 04/19/2017 05/15/2017   Cholestrol 0 - 200 mg/dL 115(L) 125 - - 80   LDLCALC 0 - 99 mg/dL 33 42  - - 16   LDLDIRECT mg/dL - - - - -   HDL >40 mg/dL 41(L) 43(L) - - 40(L)   Trlycerides <150 mg/dL 207(H) 199(H) - - 119   Hemoglobin A1c 4.8 - 5.6 % - - - - -   PHART 7.350 - 7.450 - - - - -   PCO2ART 35.0 - 45.0 mmHg - - - - -   HCO3 20.0 - 28.0 mmol/L - - 28.1(H) 27.3 -   TCO2 22 - 32 mmol/L - - 29 29 -   ACIDBASEDEF 0.0 - 2.0 mmol/L - - - - -   O2SAT % - - 78.0 75.0 -      Capillary Blood Glucose: Lab Results  Component Value Date   GLUCAP 198 (H) 04/19/2017   GLUCAP 144 (H) 04/19/2017   GLUCAP 152 (H) 04/19/2017   GLUCAP 164 (H) 04/19/2017   GLUCAP 170 (H) 04/19/2017     Pulmonary Assessment Scores:   Pulmonary Function Assessment:   Exercise Target Goals:    Exercise Program Goal: Individual exercise prescription set using results from initial 6 min walk test and THRR while considering  patient's activity barriers and safety.   Exercise Prescription Goal: Initial exercise prescription builds to 30-45 minutes a day of aerobic activity, 2-3 days per week.  Home exercise guidelines will be given to patient during program as part of exercise prescription that the participant will acknowledge.  Activity Barriers & Risk Stratification: Activity Barriers & Cardiac Risk Stratification - 05/24/17 1334      Activity Barriers & Cardiac Risk Stratification   Activity Barriers  Deconditioning;Muscular Weakness;Shortness of Breath;Balance Concerns;History of Falls    Cardiac Risk Stratification  High       6 Minute Walk: 6 Minute Walk    Row Name 05/24/17 1333 09/05/17 1010       6 Minute Walk   Phase  Initial  Discharge    Distance  1050 feet  1100 feet    Distance % Change  0 %  4.55 %    Distance Feet Change  0 ft  50 ft    Walk Time  6 minutes  6 minutes    # of Rest Breaks  0  0    MPH  1.98  2.08    METS  2.52  2.59    RPE  15  13    Perceived Dyspnea   13  13    VO2 Peak  9.79  10.49    Symptoms  No  No    Resting HR  71 bpm  74 bpm    Resting BP   102/52  106/64    Resting Oxygen Saturation   96 %  97 %    Exercise Oxygen Saturation  during 6 min walk  95 %  92 %    Max Ex. HR  77 bpm  89 bpm    Max Ex. BP  120/60  126/68    2 Minute Post BP  100/56  110/62       Oxygen Initial Assessment: Oxygen Initial Assessment - 05/24/17 1504      Home Oxygen   Home Oxygen Device  None    Sleep Oxygen Prescription  None    Home Exercise Oxygen Prescription  None    Home at Rest Exercise Oxygen Prescription  None      Initial 6 min Walk   Oxygen Used  None      Program Oxygen Prescription   Program Oxygen Prescription  None       Oxygen Re-Evaluation: Oxygen Re-Evaluation    Row Name 06/20/17 1611 07/11/17 0838 08/07/17 1245         Program Oxygen Prescription   Program Oxygen Prescription  None  None  None       Home Oxygen   Home Oxygen Device  None  None  None     Sleep Oxygen Prescription  None  None  None     Home Exercise Oxygen Prescription  None  None  None     Home at Rest Exercise Oxygen Prescription  None  None  None       Goals/Expected Outcomes   Short Term Goals  To learn and understand importance of monitoring SPO2 with pulse oximeter and demonstrate accurate use of the pulse oximeter.;To learn and understand importance of maintaining oxygen saturations>88%;To learn and demonstrate proper pursed lip breathing techniques or other breathing techniques.  To learn and exhibit compliance with exercise, home and travel O2 prescription;To learn and understand importance of monitoring SPO2 with pulse oximeter and demonstrate accurate use of the pulse oximeter.;To learn and understand importance of maintaining oxygen saturations>88%;To learn and demonstrate proper pursed lip breathing techniques or other breathing techniques.  To learn and understand importance of monitoring SPO2 with pulse oximeter and demonstrate accurate use of the pulse oximeter.;To learn and understand importance of maintaining oxygen  saturations>88%;To learn and demonstrate proper pursed lip breathing techniques or other breathing techniques.     Long  Term Goals  Verbalizes importance of monitoring SPO2 with pulse oximeter and return demonstration;Maintenance of O2 saturations>88%;Exhibits proper breathing techniques, such as pursed lip breathing or other method taught during program session;Compliance with respiratory medication  Exhibits compliance with exercise, home and travel O2 prescription;Verbalizes importance of monitoring SPO2 with pulse oximeter and return demonstration;Exhibits proper breathing techniques, such as pursed lip breathing or other method taught during program session;Maintenance of O2 saturations>88%  Verbalizes importance of monitoring SPO2 with pulse oximeter and return demonstration;Exhibits proper  breathing techniques, such as pursed lip breathing or other method taught during program session;Maintenance of O2 saturations>88%     Comments  Patient demonstrates proper use of pulse oximeter and pursed lip breathing during exercise and verbalizes understanding of maintaining her O2 sat >88%.   Patient demonstrates proper pursed lip breathing and progper use of pulse oximeter. She also verbalizes understanding of maintaining O2 sat >88%.   Patient demonstrates proper pursed lip breathing and progper use of pulse oximeter. She also verbalizes understanding of maintaining O2 sat >88%.      Goals/Expected Outcomes  Patient will continue to meet short and long term goals.   Patient will continue to meet her short and long term goals.   Patient will continue to meet her short and long term goals.         Oxygen Discharge (Final Oxygen Re-Evaluation): Oxygen Re-Evaluation - 08/07/17 1245      Program Oxygen Prescription   Program Oxygen Prescription  None      Home Oxygen   Home Oxygen Device  None    Sleep Oxygen Prescription  None    Home Exercise Oxygen Prescription  None    Home at Rest Exercise Oxygen  Prescription  None      Goals/Expected Outcomes   Short Term Goals  To learn and understand importance of monitoring SPO2 with pulse oximeter and demonstrate accurate use of the pulse oximeter.;To learn and understand importance of maintaining oxygen saturations>88%;To learn and demonstrate proper pursed lip breathing techniques or other breathing techniques.    Long  Term Goals  Verbalizes importance of monitoring SPO2 with pulse oximeter and return demonstration;Exhibits proper breathing techniques, such as pursed lip breathing or other method taught during program session;Maintenance of O2 saturations>88%    Comments  Patient demonstrates proper pursed lip breathing and progper use of pulse oximeter. She also verbalizes understanding of maintaining O2 sat >88%.     Goals/Expected Outcomes  Patient will continue to meet her short and long term goals.        Initial Exercise Prescription: Initial Exercise Prescription - 05/24/17 1400      Date of Initial Exercise RX and Referring Provider   Date  05/24/17    Referring Provider  Dr. Aundra Dubin      Treadmill   MPH  1.1    Grade  0    Minutes  15    METs  1.8      NuStep   Level  2    SPM  77    Minutes  20    METs  1.6      Prescription Details   Frequency (times per week)  3    Duration  Progress to 30 minutes of continuous aerobic without signs/symptoms of physical distress      Intensity   THRR 40-80% of Max Heartrate  105-122-139    Ratings of Perceived Exertion  11-13    Perceived Dyspnea  0-4      Progression   Progression  Continue progressive overload as per policy without signs/symptoms or physical distress.      Resistance Training   Training Prescription  Yes    Weight  1    Reps  10-15       Perform Capillary Blood Glucose checks as needed.  Exercise Prescription Changes:  Exercise Prescription Changes    Row Name 05/29/17 0800 06/06/17 0800 06/21/17 0700 07/09/17 1500 07/24/17 1400     Response to  Exercise   Blood  Pressure (Admit)  -  124/72  114/52  100/48  114/60   Blood Pressure (Exercise)  -  102/52  122/50  100/50  112/50   Blood Pressure (Exit)  -  106/54  100/50  100/48  110/60   Heart Rate (Admit)  -  73 bpm  70 bpm  70 bpm  80 bpm   Heart Rate (Exercise)  -  81 bpm  100 bpm  78 bpm  86 bpm   Heart Rate (Exit)  -  77 bpm  72 bpm  79 bpm  82 bpm   Oxygen Saturation (Admit)  -  93 %  98 %  93 %  99 %   Oxygen Saturation (Exercise)  -  96 %  93 %  99 %  99 %   Oxygen Saturation (Exit)  -  98 %  99 %  98 %  93 %   Rating of Perceived Exertion (Exercise)  -  10  11  10  9    Perceived Dyspnea (Exercise)  -  10  11  10  11    Duration  -  Progress to 30 minutes of  aerobic without signs/symptoms of physical distress  Progress to 30 minutes of  aerobic without signs/symptoms of physical distress  Progress to 30 minutes of  aerobic without signs/symptoms of physical distress  Progress to 30 minutes of  aerobic without signs/symptoms of physical distress   Intensity  -  THRR New 106-123-139  THRR New 104-122-139  THRR unchanged 104-122-139  THRR unchanged 110-126-141     Progression   Progression  -  Continue to progress workloads to maintain intensity without signs/symptoms of physical distress.  Continue to progress workloads to maintain intensity without signs/symptoms of physical distress.  Continue to progress workloads to maintain intensity without signs/symptoms of physical distress.  Continue to progress workloads to maintain intensity without signs/symptoms of physical distress.     Resistance Training   Training Prescription  Yes  Yes  Yes  Yes  Yes   Weight  1  1  1  1  1    Reps  10-15  10-15  10-15  10-15  10-15     Treadmill   MPH  1.1  1.3  1.4  1.4  1.5   Grade  0  0  0  0  0   Minutes  15  15  15  15  15    METs  1.8  1.9  2  2   2.1     NuStep   Level  2  2  2  2  2    SPM  77  67  107  112  109   Minutes  20  20  20  20  20    METs  1.6  1.5  1.7  1.6  1.6      Home Exercise Plan   Plans to continue exercise at  Home (comment)  Home (comment)  Home (comment)  Home (comment)  Home (comment)   Frequency  Add 2 additional days to program exercise sessions.  Add 2 additional days to program exercise sessions.  Add 2 additional days to program exercise sessions.  Add 2 additional days to program exercise sessions.  Add 2 additional days to program exercise sessions.   Initial Home Exercises Provided  05/28/17  05/28/17  05/28/17  05/28/17  05/28/17   Row Name 08/05/17 1400 08/21/17 1400 09/03/17 0800  Response to Exercise   Blood Pressure (Admit)  130/56  118/60  106/60     Blood Pressure (Exercise)  110/50  108/54  126/68     Blood Pressure (Exit)  108/64  108/60  116/50     Heart Rate (Admit)  66 bpm  74 bpm  73 bpm     Heart Rate (Exercise)  76 bpm  84 bpm  87 bpm     Heart Rate (Exit)  73 bpm  81 bpm  82 bpm     Oxygen Saturation (Admit)  91 %  97 %  96 %     Oxygen Saturation (Exercise)  92 %  92 %  92 %     Oxygen Saturation (Exit)  95 %  99 %  100 %     Rating of Perceived Exertion (Exercise)  10  11  13      Perceived Dyspnea (Exercise)  10  11  13      Duration  Progress to 30 minutes of  aerobic without signs/symptoms of physical distress  Progress to 30 minutes of  aerobic without signs/symptoms of physical distress  Progress to 30 minutes of  aerobic without signs/symptoms of physical distress     Intensity  THRR New 102-120-138  THRR New 107-123-139  THRR New 106-126-139       Progression   Progression  Continue to progress workloads to maintain intensity without signs/symptoms of physical distress.  Continue to progress workloads to maintain intensity without signs/symptoms of physical distress.  Continue to progress workloads to maintain intensity without signs/symptoms of physical distress.       Resistance Training   Training Prescription  Yes  Yes  Yes     Weight  1  1  1      Reps  10-15  10-15  10-15       Treadmill   MPH   1.6  1.8  1.9     Grade  0  0  0     Minutes  15  15  15      METs  2.2  2.3  2.4       NuStep   Level  2  2  2      SPM  105  82  110     Minutes  20  20  20      METs  1.6  1.5  1.6       Home Exercise Plan   Plans to continue exercise at  Home (comment)  Home (comment)  Home (comment)     Frequency  Add 2 additional days to program exercise sessions.  Add 2 additional days to program exercise sessions.  Add 2 additional days to program exercise sessions.     Initial Home Exercises Provided  05/28/17  05/28/17  05/28/17        Exercise Comments:  Exercise Comments    Row Name 05/29/17 2119 06/21/17 0741 07/09/17 1510 07/24/17 1414 08/05/17 1418   Exercise Comments  Patient received the take home exercise plan today 05/28/2017. THR was addressed as were safe ways to be active outside of Rehab. Patient demonstrated an understanding. Patient was encouraged to ask any future questions.   Patient is doing well in PR. She states that she feels stronger and has gained more endurance she has started. She has increased in SPMs on the Nustep and has increased her speed on the treadmill as well.  Patient is doing well in PR and is maintaning  all of her levels on the equipment. She has even increased her SPMs on the Nustep. We attempted to progress her some on the treadmill to 1.6 but she complained of chest discomfort shortly after. Patient states that she feels that she has increased her endurance and stamina since coming to PR.   Patient is doing well in PR. She has increased her speed on the treadmill as well as maintained her SPMs and METS on the Nustep machine. She states that this is helping her gain strength and increase endurance throughout the day.   Patient is doing well in PR. She has increased her speed on the treadmill and has maintained her level and her SPMs on the nustep machine. She states that she is feeling stronger and is able to do more activities around the house and with family.     [place] Name 08/21/17 1429 09/03/17 0816         Exercise Comments  Patient is doing well in the PR program and has increased her speed on the treadmill to 1.8. She has maintained her level on the nustep as well.   Patient is doing well in PR. Today is her last day in PR. She has done very well in the program and has inccreased her SPMs on the nustep and her speed on the treadmill. Patient is joining for maintenance.         Exercise Goals and Review:  Exercise Goals    Row Name 05/24/17 1335             Exercise Goals   Increase Physical Activity  Yes       Intervention  Provide advice, education, support and counseling about physical activity/exercise needs.;Develop an individualized exercise prescription for aerobic and resistive training based on initial evaluation findings, risk stratification, comorbidities and participant's personal goals.       Expected Outcomes  Achievement of increased cardiorespiratory fitness and enhanced flexibility, muscular endurance and strength shown through measurements of functional capacity and personal statement of participant.       Increase Strength and Stamina  Yes       Intervention  Provide advice, education, support and counseling about physical activity/exercise needs.;Develop an individualized exercise prescription for aerobic and resistive training based on initial evaluation findings, risk stratification, comorbidities and participant's personal goals.       Expected Outcomes  Achievement of increased cardiorespiratory fitness and enhanced flexibility, muscular endurance and strength shown through measurements of functional capacity and personal statement of participant.       Able to understand and use rate of perceived exertion (RPE) scale  Yes       Intervention  Provide education and explanation on how to use RPE scale       Expected Outcomes  Short Term: Able to use RPE daily in rehab to express subjective intensity level;Long Term:  Able to  use RPE to guide intensity level when exercising independently       Able to understand and use Dyspnea scale  Yes       Intervention  Provide education and explanation on how to use Dyspnea scale       Expected Outcomes  Short Term: Able to use Dyspnea scale daily in rehab to express subjective sense of shortness of breath during exertion;Long Term: Able to use Dyspnea scale to guide intensity level when exercising independently       Knowledge and understanding of Target Heart Rate Range (THRR)  Yes  Intervention  Provide education and explanation of THRR including how the numbers were predicted and where they are located for reference       Expected Outcomes  Short Term: Able to state/look up THRR       Able to check pulse independently  Yes       Intervention  Provide education and demonstration on how to check pulse in carotid and radial arteries.;Review the importance of being able to check your own pulse for safety during independent exercise       Expected Outcomes  Short Term: Able to explain why pulse checking is important during independent exercise;Long Term: Able to check pulse independently and accurately       Understanding of Exercise Prescription  Yes       Intervention  Provide education, explanation, and written materials on patient's individual exercise prescription       Expected Outcomes  Short Term: Able to explain program exercise prescription;Long Term: Able to explain home exercise prescription to exercise independently          Exercise Goals Re-Evaluation : Exercise Goals Re-Evaluation    Row Name 06/21/17 0740 07/09/17 1508 08/05/17 1416 09/03/17 0813       Exercise Goal Re-Evaluation   Exercise Goals Review  Increase Physical Activity;Increase Strength and Stamina;Knowledge and understanding of Target Heart Rate Range (THRR)  Increase Physical Activity;Increase Strength and Stamina;Knowledge and understanding of Target Heart Rate Range (THRR)  Increase  Physical Activity;Increase Strength and Stamina;Knowledge and understanding of Target Heart Rate Range (THRR)  Increase Physical Activity;Increase Strength and Stamina;Knowledge and understanding of Target Heart Rate Range (THRR)    Comments  Patient is doing well in PR. She states that she feels stronger and has gained more endurance she has started. She has increased in SPMs on the Nustep and has increased her speed on the treadmill as well.   Patient is doing well in PR and is maintaning all of her levels on the equipment. She has even increased her SPMs on the Nustep. We attempted to progress her some on the treadmill to 1.6 but she complained of chest discomfort shortly after. Patient states that she feels that she has increased her endurance and stamina since coming to PR.   Patient is doing well in PR. She has increased her speed on the treadmill and has maintained her level and her SPMs on the nustep machine. She states that she is feeling stronger and is able to do more activities around the house and with family.   Patient is doing well in PR. Today is her last day in PR. She has done very well in the program and has inccreased her SPMs on the nustep and her speed on the treadmill. Patient is joining for maintenance.     Expected Outcomes  Patient wishes to gain strength and stamina and to increase breathing   Patient wishes to increase strength and stamina also to improve breathing and decrease chest pain.   Patient wishes to increase strength and stamina and to be able to improve breathing. Also to decrease the appearance of chest pain.   Patient wishes to increase strength and improve breathing and decrease chest pain.        Discharge Exercise Prescription (Final Exercise Prescription Changes): Exercise Prescription Changes - 09/03/17 0800      Response to Exercise   Blood Pressure (Admit)  106/60    Blood Pressure (Exercise)  126/68    Blood Pressure (Exit)  116/50  Heart Rate (Admit)   73 bpm    Heart Rate (Exercise)  87 bpm    Heart Rate (Exit)  82 bpm    Oxygen Saturation (Admit)  96 %    Oxygen Saturation (Exercise)  92 %    Oxygen Saturation (Exit)  100 %    Rating of Perceived Exertion (Exercise)  13    Perceived Dyspnea (Exercise)  13    Duration  Progress to 30 minutes of  aerobic without signs/symptoms of physical distress    Intensity  THRR New [id number]      Progression   Progression  Continue to progress workloads to maintain intensity without signs/symptoms of physical distress.      Resistance Training   Training Prescription  Yes    Weight  1    Reps  10-15      Treadmill   MPH  1.9    Grade  0    Minutes  15    METs  2.4      NuStep   Level  2    SPM  110    Minutes  20    METs  1.6      Home Exercise Plan   Plans to continue exercise at  Home (comment)    Frequency  Add 2 additional days to program exercise sessions.    Initial Home Exercises Provided  05/28/17       Nutrition:  Target Goals: Understanding of nutrition guidelines, daily intake of sodium <1548m, cholesterol <208m calories 30% from fat and 7% or less from saturated fats, daily to have 5 or more servings of fruits and vegetables.  Biometrics: Pre Biometrics - 05/24/17 1425      Pre Biometrics   Height  5' 3"  (1.6 m)    Weight  114 lb 10.2 oz (52 kg)    Waist Circumference  30.5 inches    Hip Circumference  33.5 inches    Waist to Hip Ratio  0.91 %    BMI (Calculated)  20.31    Triceps Skinfold  8 mm    % Body Fat  27 %    Grip Strength  41.4 kg    Flexibility  10.33 in    Single Leg Stand  10 seconds      Post Biometrics - 09/05/17 1011       Post  Biometrics   Height  5' 3"  (1.6 m)    Weight  118 lb 3.7 oz (53.6 kg)    Waist Circumference  30 inches    Hip Circumference  34 inches    Waist to Hip Ratio  0.88 %    BMI (Calculated)  20.95    Triceps Skinfold  9 mm    % Body Fat  27.6 %    Grip Strength  35.67 kg    Flexibility  12.33 in     Single Leg Stand  60 seconds       Nutrition Therapy Plan and Nutrition Goals: Nutrition Therapy & Goals - 08/07/17 1252      Nutrition Therapy   RD appointment deferred  Yes      Personal Nutrition Goals   Nutrition Goal  Diabetic diet.    Personal Goal #2  She is on a diabetic diet. Her diet is very specialized due to her gastrial intestinal problems.     Comments  Patient is following a diabetic diet.       Intervention Plan   Intervention  Nutrition  handout(s) given to patient.    Expected Outcomes  Short Term Goal: Understand basic principles of dietary content, such as calories, fat, sodium, cholesterol and nutrients.       Nutrition Assessments: Nutrition Assessments - 09/05/17 1531      MEDFICTS Scores   Pre Score  9    Post Score  13    Score Difference  4       Nutrition Goals Re-Evaluation:   Nutrition Goals Discharge (Final Nutrition Goals Re-Evaluation):   Psychosocial: Target Goals: Acknowledge presence or absence of significant depression and/or stress, maximize coping skills, provide positive support system. Participant is able to verbalize types and ability to use techniques and skills needed for reducing stress and depression.  Initial Review & Psychosocial Screening: Initial Psych Review & Screening - 05/24/17 1509      Initial Review   Current issues with  None Identified      Family Dynamics   Good Support System?  Yes      Barriers   Psychosocial barriers to participate in program  There are no identifiable barriers or psychosocial needs.      Screening Interventions   Interventions  Encouraged to exercise       Quality of Life Scores: Quality of Life - 09/05/17 1012      Quality of Life Scores   Health/Function Pre  13.17 %    Health/Function Post  25.03 %    Health/Function % Change  90.05 %    Socioeconomic Pre  26.57 %    Socioeconomic Post  29.64 %    Socioeconomic % Change   11.55 %    Psych/Spiritual Pre  22.93 %     Psych/Spiritual Post  28.79 %    Psych/Spiritual % Change  25.56 %    Family Pre  30 %    Family Post  28.8 %    Family % Change  -4 %    GLOBAL Pre  20.41 %    GLOBAL Post  27.31 %    GLOBAL % Change  33.81 %      Scores of 19 and below usually indicate a poorer quality of life in these areas.  A difference of  2-3 points is a clinically meaningful difference.  A difference of 2-3 points in the total score of the Quality of Life Index has been associated with significant improvement in overall quality of life, self-image, physical symptoms, and general health in studies assessing change in quality of life.   PHQ-9: Recent Review Flowsheet Data    Depression screen [facility] 2/9 09/05/2017 08/13/2017 05/24/2017   Decreased Interest 0 0 0   Down, Depressed, Hopeless 0 0 0   PHQ - 2 Score 0 0 0   Altered sleeping 1 - 1   Tired, decreased energy 1 - 2   Change in appetite 0 - 1   Feeling bad or failure about yourself  0 - 0   Trouble concentrating 0 - 0   Moving slowly or fidgety/restless 0 - 0   Suicidal thoughts 0 - 0   PHQ-9 Score 2 - 4   Difficult doing work/chores - - Not difficult at all     Interpretation of Total Score  Total Score Depression Severity:  1-4 = Minimal depression, 5-9 = Mild depression, 10-14 = Moderate depression, 15-19 = Moderately severe depression, 20-27 = Severe depression   Psychosocial Evaluation and Intervention: Psychosocial Evaluation - 09/05/17 1536      Discharge Psychosocial Assessment &  Intervention   Comments  Patient discharge QOL score improved by 33.81% at 27.31 and her PHQ-9 score improved by 2. She has no psychosocial identified at discharge.        Psychosocial Re-Evaluation: Psychosocial Re-Evaluation    Rosita Name 06/20/17 1618 07/11/17 0858 08/07/17 1255         Psychosocial Re-Evaluation   Current issues with  None Identified  None Identified  None Identified     Comments  Patient's initial QOL score was 20.41 and her PHQ-9 score was  4.  -  Patient's initial QOL score was 20.41 and her PHQ-9 score was 4.     Expected Outcomes  Patient will have no psychosocial barriers identified at discharge.   Patient will have no psychosocial barriers identified at discharge.   Patient will have no psychosocial barriers identified at discharge.      Interventions  Stress management education;Encouraged to attend Pulmonary Rehabilitation for the exercise;Relaxation education  Encouraged to attend Pulmonary Rehabilitation for the exercise;Relaxation education;Stress management education  Encouraged to attend Pulmonary Rehabilitation for the exercise;Relaxation education;Stress management education     Continue Psychosocial Services   No Follow up required  No Follow up required  No Follow up required        Psychosocial Discharge (Final Psychosocial Re-Evaluation): Psychosocial Re-Evaluation - 08/07/17 1255      Psychosocial Re-Evaluation   Current issues with  None Identified    Comments  Patient's initial QOL score was 20.41 and her PHQ-9 score was 4.    Expected Outcomes  Patient will have no psychosocial barriers identified at discharge.     Interventions  Encouraged to attend Pulmonary Rehabilitation for the exercise;Relaxation education;Stress management education    Continue Psychosocial Services   No Follow up required        Education: Education Goals: Education classes will be provided on a weekly basis, covering required topics. Participant will state understanding/return demonstration of topics presented.  Learning Barriers/Preferences: Learning Barriers/Preferences - 05/24/17 1457      Learning Barriers/Preferences   Learning Barriers  None    Learning Preferences  Video;Written Material;Pictoral;Computer/Internet       Education Topics: How Lungs Work and Diseases: - Discuss the anatomy of the lungs and diseases that can affect the lungs, such as COPD.   Exercise: -Discuss the importance of exercise, FITT  principles of exercise, normal and abnormal responses to exercise, and how to exercise safely.   PULMONARY REHAB OTHER RESPIRATORY from 08/29/2017 in [place]  Date  05/30/17  Educator  DJ  Instruction Review Code  2- Demonstrated Understanding      Environmental Irritants: -Discuss types of environmental irritants and how to limit exposure to environmental irritants.   Meds/Inhalers and oxygen: - Discuss respiratory medications, definition of an inhaler and oxygen, and the proper way to use an inhaler and oxygen.   Energy Saving Techniques: - Discuss methods to conserve energy and decrease shortness of breath when performing activities of daily living.    Bronchial Hygiene / Breathing Techniques: - Discuss breathing mechanics, pursed-lip breathing technique,  proper posture, effective ways to clear airways, and other functional breathing techniques   Cleaning Equipment: - Provides group verbal and written instruction about the health risks of elevated stress, cause of high stress, and healthy ways to reduce stress.   PULMONARY REHAB OTHER RESPIRATORY from 08/29/2017 in [place]  Date  07/04/17  Educator  Etheleen Mayhew  Instruction Review Code  2- Demonstrated Understanding  Nutrition I: Fats: - Discuss the types of cholesterol, what cholesterol does to the body, and how cholesterol levels can be controlled.   PULMONARY REHAB OTHER RESPIRATORY from 08/29/2017 in [place]  Date  07/11/17  Educator  DJ  Instruction Review Code  2- Demonstrated Understanding      Nutrition II: Labels: -Discuss the different components of food labels and how to read food labels.   Respiratory Infections: - Discuss the signs and symptoms of respiratory infections, ways to prevent respiratory infections, and the importance of seeking medical treatment when having a respiratory infection.   Stress I: Signs and  Symptoms: - Discuss the causes of stress, how stress may lead to anxiety and depression, and ways to limit stress.   PULMONARY REHAB OTHER RESPIRATORY from 08/29/2017 in [place]  Date  08/01/17  Educator  Marya Amsler C.  Instruction Review Code  2- Demonstrated Understanding      Stress II: Relaxation: -Discuss relaxation techniques to limit stress.   PULMONARY REHAB OTHER RESPIRATORY from 08/29/2017 in [place]  Date  08/08/17  Educator  DJ  Instruction Review Code  2- Demonstrated Understanding      Oxygen for Home/Travel: - Discuss how to prepare for travel when on oxygen and proper ways to transport and store oxygen to ensure safety.   PULMONARY REHAB OTHER RESPIRATORY from 08/29/2017 in [place]  Date  08/15/17  Educator  DJ  Instruction Review Code  2- Demonstrated Understanding      Knowledge Questionnaire Score: Knowledge Questionnaire Score - 09/05/17 1531      Knowledge Questionnaire Score   Pre Score  23/24    Post Score  23/24       Core Components/Risk Factors/Patient Goals at Admission: Personal Goals and Risk Factors at Admission - 05/24/17 1507      Core Components/Risk Factors/Patient Goals on Admission    Weight Management  Weight Maintenance    Improve shortness of breath with ADL's  Yes    Intervention  Provide education, individualized exercise plan and daily activity instruction to help decrease symptoms of SOB with activities of daily living.    Expected Outcomes  Short Term: Achieves a reduction of symptoms when performing activities of daily living.    Personal Goal Other  Yes    Personal Goal  Increase strength and stamina, improve breathing and decrease chest discomfort with exertion. Be able to start traveling in their RV again.     Intervention  Attend CR 2 x week and supplement with at home exercise 3 x week.     Expected Outcomes  Reach personal goals.        Core  Components/Risk Factors/Patient Goals Review:  Goals and Risk Factor Review    Row Name 06/20/17 1613 07/11/17 0853 08/07/17 1252 09/05/17 1533       Core Components/Risk Factors/Patient Goals Review   Personal Goals Review  Weight Management/Obesity;Improve shortness of breath with ADL's;Develop more efficient breathing techniques such as purse lipped breathing and diaphragmatic breathing and practicing self-pacing with activity.;Heart Failure Gain weight. Get stronger; be able to travel in RV again and visit family.  Weight Management/Obesity;Improve shortness of breath with ADL's;Develop more efficient breathing techniques such as purse lipped breathing and diaphragmatic breathing and practicing self-pacing with activity.;Diabetes;Heart Failure Get stronger; be able to travel in RV again to visit family.  Weight Management/Obesity;Improve shortness of breath with ADL's;Develop more efficient breathing techniques such as purse lipped breathing and  diaphragmatic breathing and practicing self-pacing with activity.;Diabetes;Heart Failure  Weight Management/Obesity;Improve shortness of breath with ADL's;Develop more efficient breathing techniques such as purse lipped breathing and diaphragmatic breathing and practicing self-pacing with activity.;Diabetes;Heart Failure    Review  Patient has completed 8 sessions gaining 1 lb. She says she feels the program is helping her. She says she still gets SOB but she feels her endurance and strength have improved. She her husband also has noticed an improvement. She says she is able to do more around the house without getting fatigued. She is able to climb stairs without having to stop and rest before she reaches the top. She has not traveled in her RV yet but hopes to in the Spring. Will continue to monitor for progress.   Patient has completed 11 sessions maintaining her weight. She states that she continues to get stronger and is able to do more around the house now  without getting fatigued. She says the program is helpinig her meet her goals. She hopes to travel again in her RV in the Spring. Will continue to monitor.   Patient has completed 16 sessions gaining 2 lbs. She states that she continues to get stronger and is able to do more around the house now without getting fatigued. She continues to say the program is helpinig her meet her goals. She has a trip planned in April to travel in RV for 1 to 2 months. She is happy she feels like making this trip.  Will continue to monitor.   Patient graduated with 24 sessions. She gained 4 lbs since she started the program. She did well with her exit measurements improving in grip strenght, flexibility and balance. Her exit walk test improved by 4.55%. She says she is exercising 2 days/week outside of PR. She says she feels stronger and has more energy and plans to travel again in her RV in March. She is very pleased with her results and says she feels better overall and much more confident in what she can do now and her health overall. She plans to join our [employer] program to continue exercising.     Expected Outcomes  Patient will continue to attend sessiona and complete the program and continue to meet her personal goals.   Patient will continue to attend sessions and complete the program continuing to meet her personal goals.   Patient will continue to attend sessions and complete the program continuing to meet her personal goals.   Patient will continue exercising in our [place] maintenance program and continue to meet her personal goals. PR will f/u for 1 year.        Core Components/Risk Factors/Patient Goals at Discharge (Final Review):  Goals and Risk Factor Review - 09/05/17 1533      Core Components/Risk Factors/Patient Goals Review   Personal Goals Review  Weight Management/Obesity;Improve shortness of breath with ADL's;Develop more efficient breathing techniques such as purse lipped breathing  and diaphragmatic breathing and practicing self-pacing with activity.;Diabetes;Heart Failure    Review  Patient graduated with 24 sessions. She gained 4 lbs since she started the program. She did well with her exit measurements improving in grip strenght, flexibility and balance. Her exit walk test improved by 4.55%. She says she is exercising 2 days/week outside of PR. She says she feels stronger and has more energy and plans to travel again in her RV in March. She is very pleased with her results and says she feels better overall and much  more confident in what she can do now and her health overall. She plans to join our [employer] program to continue exercising.     Expected Outcomes  Patient will continue exercising in our [place] maintenance program and continue to meet her personal goals. PR will f/u for 1 year.        ITP Comments: ITP Comments    Row Name 05/24/17 1502           ITP Comments  Mrs. Kotula has been in the program before as a heart patient. She is a brittle diabetic with a insulin pump. She gets dizzy if overly exerted. We will gradually increase her exercise work loads.           Comments: Patient graduated from Pulmonary Rehabilitation today on 09/03/2017 after completing 24 sessions. She achieved LTG of 30 minutes of aerobic exercise at Max Met level of 2.59. All patients vitals are WNL. Patient has met with dietician. Discharge instruction has been reviewed in detail and patient stated an understanding of material given. Patient plans to join our [employer] program to continue exercising. Pulmomary Rehab staff will make f/u calls at 1 month, 6 months, and 1 year. Patient had no complaints of any abnormal S/S or pain on their exit visit.
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
## 2017-09-05 NOTE — Patient Instructions (Addendum)
Shingrix -[facility] [facility] [employer] records-want to check on Tdap status.

## 2017-09-05 NOTE — Progress Notes (Signed)
Discharge Progress Report  Patient Details  Name: [name] [name] MRN: [id number] Date of Birth: 09-10-1950 Referring Provider:     PULMONARY REHAB OTHER RESP ORIENTATION from 05/24/2017 in [place]  Referring Provider  Dr. Aundra Dubin       Number of Visits: 24  Reason for Discharge:  Patient reached a stable level of exercise. Patient independent in their exercise. Patient has met program and personal goals.  Smoking History:  Social History   Tobacco Use  Smoking Status Never Smoker  Smokeless Tobacco Never Used    Diagnosis:  Diastolic CHF, chronic ([place])  ADL [facility]:   Initial Exercise Prescription: Initial Exercise Prescription - 05/24/17 1400      Date of Initial Exercise RX and Referring Provider   Date  05/24/17    Referring Provider  Dr. Aundra Dubin      Treadmill   MPH  1.1    Grade  0    Minutes  15    METs  1.8      NuStep   Level  2    SPM  77    Minutes  20    METs  1.6      Prescription Details   Frequency (times per week)  3    Duration  Progress to 30 minutes of continuous aerobic without signs/symptoms of physical distress      Intensity   THRR 40-80% of Max Heartrate  105-122-139    Ratings of Perceived Exertion  11-13    Perceived Dyspnea  0-4      Progression   Progression  Continue progressive overload as per policy without signs/symptoms or physical distress.      Resistance Training   Training Prescription  Yes    Weight  1    Reps  10-15       Discharge Exercise Prescription (Final Exercise Prescription Changes): Exercise Prescription Changes - 09/03/17 0800      Response to Exercise   Blood Pressure (Admit)  106/60    Blood Pressure (Exercise)  126/68    Blood Pressure (Exit)  116/50    Heart Rate (Admit)  73 bpm    Heart Rate (Exercise)  87 bpm    Heart Rate (Exit)  82 bpm    Oxygen Saturation (Admit)  96 %    Oxygen Saturation (Exercise)  92 %    Oxygen Saturation (Exit)  100 %    Rating of  Perceived Exertion (Exercise)  13    Perceived Dyspnea (Exercise)  13    Duration  Progress to 30 minutes of  aerobic without signs/symptoms of physical distress    Intensity  THRR New 106-126-139      Progression   Progression  Continue to progress workloads to maintain intensity without signs/symptoms of physical distress.      Resistance Training   Training Prescription  Yes    Weight  1    Reps  10-15      Treadmill   MPH  1.9    Grade  0    Minutes  15    METs  2.4      NuStep   Level  2    SPM  110    Minutes  20    METs  1.6      Home Exercise Plan   Plans to continue exercise at  Home (comment)    Frequency  Add 2 additional days to program exercise sessions.    Initial Home  Exercises Provided  05/28/17       Functional Capacity: 6 Minute Walk    Row Name 05/24/17 1333 09/05/17 1010       6 Minute Walk   Phase  Initial  Discharge    Distance  1050 feet  1100 feet    Distance % Change  0 %  4.55 %    Distance Feet Change  0 ft  50 ft    Walk Time  6 minutes  6 minutes    # of Rest Breaks  0  0    MPH  1.98  2.08    METS  2.52  2.59    RPE  15  13    Perceived Dyspnea   13  13    VO2 Peak  9.79  10.49    Symptoms  No  No    Resting HR  71 bpm  74 bpm    Resting BP  102/52  106/64    Resting Oxygen Saturation   96 %  97 %    Exercise Oxygen Saturation  during 6 min walk  95 %  92 %    Max Ex. HR  77 bpm  89 bpm    Max Ex. BP  120/60  126/68    2 Minute Post BP  100/56  110/62       Psychological, QOL, Others - Outcomes: PHQ 2/9: Depression screen [facility] 2/9 09/05/2017 08/13/2017 05/24/2017  Decreased Interest 0 0 0  Down, Depressed, Hopeless 0 0 0  PHQ - 2 Score 0 0 0  Altered sleeping 1 - 1  Tired, decreased energy 1 - 2  Change in appetite 0 - 1  Feeling bad or failure about yourself  0 - 0  Trouble concentrating 0 - 0  Moving slowly or fidgety/restless 0 - 0  Suicidal thoughts 0 - 0  PHQ-9 Score 2 - 4  Difficult doing work/chores - - Not  difficult at all  Some recent data might be hidden    Quality of Life: Quality of Life - 09/05/17 1012      Quality of Life Scores   Health/Function Pre  13.17 %    Health/Function Post  25.03 %    Health/Function % Change  90.05 %    Socioeconomic Pre  26.57 %    Socioeconomic Post  29.64 %    Socioeconomic % Change   11.55 %    Psych/Spiritual Pre  22.93 %    Psych/Spiritual Post  28.79 %    Psych/Spiritual % Change  25.56 %    Family Pre  30 %    Family Post  28.8 %    Family % Change  -4 %    GLOBAL Pre  20.41 %    GLOBAL Post  27.31 %    GLOBAL % Change  33.81 %       Personal Goals: Goals established at orientation with interventions provided to work toward goal. Personal Goals and Risk Factors at Admission - 05/24/17 1507      Core Components/Risk Factors/Patient Goals on Admission    Weight Management  Weight Maintenance    Improve shortness of breath with ADL's  Yes    Intervention  Provide education, individualized exercise plan and daily activity instruction to help decrease symptoms of SOB with activities of daily living.    Expected Outcomes  Short Term: Achieves a reduction of symptoms when performing activities of daily living.    Personal Goal  Other  Yes    Personal Goal  Increase strength and stamina, improve breathing and decrease chest discomfort with exertion. Be able to start traveling in their RV again.     Intervention  Attend CR 2 x week and supplement with at home exercise 3 x week.     Expected Outcomes  Reach personal goals.         Personal Goals Discharge: Goals and Risk Factor Review    Row Name 06/20/17 1613 07/11/17 0853 08/07/17 1252 09/05/17 1533       Core Components/Risk Factors/Patient Goals Review   Personal Goals Review  Weight Management/Obesity;Improve shortness of breath with ADL's;Develop more efficient breathing techniques such as purse lipped breathing and diaphragmatic breathing and practicing self-pacing with  activity.;Heart Failure Gain weight. Get stronger; be able to travel in RV again and visit family.  Weight Management/Obesity;Improve shortness of breath with ADL's;Develop more efficient breathing techniques such as purse lipped breathing and diaphragmatic breathing and practicing self-pacing with activity.;Diabetes;Heart Failure Get stronger; be able to travel in RV again to visit family.  Weight Management/Obesity;Improve shortness of breath with ADL's;Develop more efficient breathing techniques such as purse lipped breathing and diaphragmatic breathing and practicing self-pacing with activity.;Diabetes;Heart Failure  Weight Management/Obesity;Improve shortness of breath with ADL's;Develop more efficient breathing techniques such as purse lipped breathing and diaphragmatic breathing and practicing self-pacing with activity.;Diabetes;Heart Failure    Review  Patient has completed 8 sessions gaining 1 lb. She says she feels the program is helping her. She says she still gets SOB but she feels her endurance and strength have improved. She her husband also has noticed an improvement. She says she is able to do more around the house without getting fatigued. She is able to climb stairs without having to stop and rest before she reaches the top. She has not traveled in her RV yet but hopes to in the Spring. Will continue to monitor for progress.   Patient has completed 11 sessions maintaining her weight. She states that she continues to get stronger and is able to do more around the house now without getting fatigued. She says the program is helpinig her meet her goals. She hopes to travel again in her RV in the Spring. Will continue to monitor.   Patient has completed 16 sessions gaining 2 lbs. She states that she continues to get stronger and is able to do more around the house now without getting fatigued. She continues to say the program is helpinig her meet her goals. She has a trip planned in April to travel in  RV for 1 to 2 months. She is happy she feels like making this trip.  Will continue to monitor.   Patient graduated with 24 sessions. She gained 4 lbs since she started the program. She did well with her exit measurements improving in grip strenght, flexibility and balance. Her exit walk test improved by 4.55%. She says she is exercising 2 days/week outside of [facility]. She says she feels stronger and has more energy and plans to travel again in her RV in March. She is very pleased with her results and says she feels better overall and much more confident in what she can do now and her health overall. She plans to join our [employer] program to continue exercising.     Expected Outcomes  Patient will continue to attend sessiona and complete the program and continue to meet her personal goals.   Patient will continue to attend sessions and  complete the program continuing to meet her personal goals.   Patient will continue to attend sessions and complete the program continuing to meet her personal goals.   Patient will continue exercising in our [place] maintenance program and continue to meet her personal goals. PR will f/u for 1 year.        Exercise Goals and Review: Exercise Goals    Row Name 05/24/17 1335             Exercise Goals   Increase Physical Activity  Yes       Intervention  Provide advice, education, support and counseling about physical activity/exercise needs.;Develop an individualized exercise prescription for aerobic and resistive training based on initial evaluation findings, risk stratification, comorbidities and participant's personal goals.       Expected Outcomes  Achievement of increased cardiorespiratory fitness and enhanced flexibility, muscular endurance and strength shown through measurements of functional capacity and personal statement of participant.       Increase Strength and Stamina  Yes       Intervention  Provide advice, education, support and  counseling about physical activity/exercise needs.;Develop an individualized exercise prescription for aerobic and resistive training based on initial evaluation findings, risk stratification, comorbidities and participant's personal goals.       Expected Outcomes  Achievement of increased cardiorespiratory fitness and enhanced flexibility, muscular endurance and strength shown through measurements of functional capacity and personal statement of participant.       Able to understand and use rate of perceived exertion (RPE) scale  Yes       Intervention  Provide education and explanation on how to use RPE scale       Expected Outcomes  Short Term: Able to use RPE daily in rehab to express subjective intensity level;Long Term:  Able to use RPE to guide intensity level when exercising independently       Able to understand and use Dyspnea scale  Yes       Intervention  Provide education and explanation on how to use Dyspnea scale       Expected Outcomes  Short Term: Able to use Dyspnea scale daily in rehab to express subjective sense of shortness of breath during exertion;Long Term: Able to use Dyspnea scale to guide intensity level when exercising independently       Knowledge and understanding of Target Heart Rate Range (THRR)  Yes       Intervention  Provide education and explanation of THRR including how the numbers were predicted and where they are located for reference       Expected Outcomes  Short Term: Able to state/look up THRR       Able to check pulse independently  Yes       Intervention  Provide education and demonstration on how to check pulse in carotid and radial arteries.;Review the importance of being able to check your own pulse for safety during independent exercise       Expected Outcomes  Short Term: Able to explain why pulse checking is important during independent exercise;Long Term: Able to check pulse independently and accurately       Understanding of Exercise Prescription   Yes       Intervention  Provide education, explanation, and written materials on patient's individual exercise prescription       Expected Outcomes  Short Term: Able to explain program exercise prescription;Long Term: Able to explain home exercise prescription to exercise independently  Nutrition & Weight - Outcomes: Pre Biometrics - 05/24/17 1425      Pre Biometrics   Height  _0  (1.6 m)    Weight  114 lb 10.2 oz (52 kg)    Waist Circumference  30.5 inches    Hip Circumference  33.5 inches    Waist to Hip Ratio  0.91 %    BMI (Calculated)  20.31    Triceps Skinfold  8 mm    % Body Fat  27 %    Grip Strength  41.4 kg    Flexibility  10.33 in    Single Leg Stand  10 seconds      Post Biometrics - 09/05/17 1011       Post  Biometrics   Height  _1  (1.6 m)    Weight  118 lb 3.7 oz (53.6 kg)    Waist Circumference  30 inches    Hip Circumference  34 inches    Waist to Hip Ratio  0.88 %    BMI (Calculated)  20.95    Triceps Skinfold  9 mm    % Body Fat  27.6 %    Grip Strength  35.67 kg    Flexibility  12.33 in    Single Leg Stand  60 seconds       Nutrition: Nutrition Therapy & Goals - 08/07/17 1252      Nutrition Therapy   RD appointment deferred  Yes      Personal Nutrition Goals   Nutrition Goal  Diabetic diet.    Personal Goal #2  She is on a diabetic diet. Her diet is very specialized due to her gastrial intestinal problems.     Comments  Patient is following a diabetic diet.       Intervention Plan   Intervention  Nutrition handout(s) given to patient.    Expected Outcomes  Short Term Goal: Understand basic principles of dietary content, such as calories, fat, sodium, cholesterol and nutrients.       Nutrition Discharge: Nutrition Assessments - 09/05/17 1531      MEDFICTS Scores   Pre Score  9    Post Score  13    Score Difference  4       Education Questionnaire Score: Knowledge Questionnaire Score - 09/05/17 1531      Knowledge  Questionnaire Score   Pre Score  23/24    Post Score  23/24       Goals reviewed with patient; copy given to patient.

## 2017-09-06 ENCOUNTER — Encounter: Payer: Self-pay | Admitting: Family Medicine

## 2017-09-06 LAB — CBC WITH DIFFERENTIAL/PLATELET
BASOS PCT: 1.1 %
Basophils Absolute: 78 cells/uL (ref 0–200)
Eosinophils Absolute: 142 cells/uL (ref 15–500)
Eosinophils Relative: 2 %
HCT: 38.7 % (ref 35.0–45.0)
Hemoglobin: 13.2 g/dL (ref 11.7–15.5)
Lymphs Abs: 1491 cells/uL (ref 850–3900)
MCH: 30.8 pg (ref 27.0–33.0)
MCHC: 34.1 g/dL (ref 32.0–36.0)
MCV: 90.2 fL (ref 80.0–100.0)
MONOS PCT: 8.5 %
MPV: 11.8 fL (ref 7.5–12.5)
Neutro Abs: 4785 cells/uL (ref 1500–7800)
Neutrophils Relative %: 67.4 %
PLATELETS: 116 10*3/uL — AB (ref 140–400)
RBC: 4.29 10*6/uL (ref 3.80–5.10)
RDW: 13.6 % (ref 11.0–15.0)
TOTAL LYMPHOCYTE: 21 %
WBC mixed population: 604 cells/uL (ref 200–950)
WBC: 7.1 10*3/uL (ref 3.8–10.8)

## 2017-09-06 LAB — HIV ANTIBODY (ROUTINE TESTING W REFLEX): HIV: NONREACTIVE

## 2017-09-09 ENCOUNTER — Telehealth: Payer: Self-pay | Admitting: Family Medicine

## 2017-09-09 NOTE — Telephone Encounter (Signed)
PT and SP want to come in tomorrow for a Shingrix shot, I advised them I thought that was fine, however if it was not [place]. And if they didn't here from you, to come on in.  They wanted to come in today, but you were in class, and the other nurses was not sure.

## 2017-09-09 NOTE — Telephone Encounter (Signed)
This should be fine 

## 2017-09-10 ENCOUNTER — Other Ambulatory Visit (HOSPITAL_COMMUNITY): Payer: Self-pay | Admitting: *Deleted

## 2017-09-10 ENCOUNTER — Encounter: Payer: Self-pay | Admitting: Family Medicine

## 2017-09-10 ENCOUNTER — Encounter (HOSPITAL_COMMUNITY): Payer: Medicare Other

## 2017-09-10 ENCOUNTER — Ambulatory Visit (INDEPENDENT_AMBULATORY_CARE_PROVIDER_SITE_OTHER): Payer: Medicare Other

## 2017-09-10 DIAGNOSIS — Z23 Encounter for immunization: Secondary | ICD-10-CM

## 2017-09-10 LAB — CYTOLOGY - PAP
DIAGNOSIS: NEGATIVE
HPV (WINDOPATH): NOT DETECTED

## 2017-09-10 MED ORDER — SPIRONOLACTONE 25 MG PO TABS: 12.5000 mg | ORAL_TABLET | Freq: Every day | ORAL | 3 refills | Status: DC

## 2017-09-10 NOTE — Progress Notes (Signed)
Patient came in for first Shingrix.

## 2017-09-12 ENCOUNTER — Encounter (HOSPITAL_COMMUNITY): Payer: Medicare Other

## 2017-09-13 ENCOUNTER — Ambulatory Visit: Payer: Medicare Other | Admitting: Family Medicine

## 2017-09-13 ENCOUNTER — Encounter: Payer: Self-pay | Admitting: Family Medicine

## 2017-09-13 ENCOUNTER — Other Ambulatory Visit: Payer: Self-pay

## 2017-09-13 VITALS — BP 138/54 | HR 72 | Temp 97.5°F | Resp 16 | Ht 62.0 in | Wt 117.0 lb

## 2017-09-13 DIAGNOSIS — T50905A Adverse effect of unspecified drugs, medicaments and biological substances, initial encounter: Secondary | ICD-10-CM

## 2017-09-13 MED ORDER — LORATADINE 10 MG PO TABS: 10.0000 mg | ORAL_TABLET | Freq: Every day | ORAL | 11 refills | Status: DC

## 2017-09-13 NOTE — Patient Instructions (Signed)
Ice to area claritin twice a day Benadryl as needed in addition Call for problems

## 2017-09-13 NOTE — Progress Notes (Signed)
Chief Complaint  Patient presents with  . Allergic Reaction    left arm, shingles vaccine   Patient is here for a for a sick visit.  Usual PCP is [name] MD She had a shingles shot, Shingrix, on 09/05/17.  She is here to discuss shot reaction.  She did have fever, malaise, fatigue, and headache for several days after the injection.  She also has redness on her deltoid region that is still terribly itchy and does not appear to be resolving since the injection.  We discussed that this Shingrix vaccination has a high incidence of shot reactions, and management.  Another problem that she mentions, out of curiosity, is that she gets recurring blood blisters on the inside of her mouth.  The resolve spontaneously without treatment.  They are minimally uncomfortable.  They seem to happen every couple of weeks, and she wonders whether it is a reaction from her cholesterol medicine probably went which is injected once every 2 weeks.  Her dentist does look at them and does not know what to make of them, per patient.  He referred her to an Chief Financial Officer.  When she went to the oral surgeon she did not have any.  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  This is a recurring blood blisters of the oral mucosa and palate that are caused by microtrauma.  On common.  Usually in older people.  Patient is on a blood thinner.   Patient Active Problem List   Diagnosis Date Noted  . OSA on CPAP 04/24/2017  . Genetic testing 01/14/2017  . S/P left mastectomy 07/29/2016  . Dense breast 12/11/2015  . Hx of iron deficiency anemia 12/11/2015  . Chronic anticoagulation 12/11/2015  . Breast cancer screening, high risk patient 12/11/2015  . Type 1 diabetes mellitus ([place]) 11/17/2015  . 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 Hx of radiation therapy 04/23/2015  . Thrombocytopenia ([place]) 04/23/2015  . Iron deficiency anemia secondary to blood  loss (chronic) 04/23/2015  . 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 Osteopenia determined by x-ray 04/23/2015  . DM (diabetes mellitus) with complications ([place]) 19/41/7408  . Radiation therapy complication 14/48/1856  . History of nodular sclerosis Hodgkin's disease 10/27/2014  . Iron deficiency anemia due to chronic blood loss 10/27/2014  . Screening mammogram for high-risk patient 10/27/2014  . Insulin dependent type 1 diabetes mellitus ([place]) 10/07/2014  . Diabetic gastroparesis ([place]) 07/30/2014  . Claudication ([place]) 07/29/2014  . PAF (paroxysmal atrial fibrillation) ([place])   . CKD (chronic kidney disease), stage II   . Coronary artery disease involving native coronary artery of native heart with unstable angina pectoris ([place])   . Type 2 diabetes mellitus with complication ([place])   . Abdominal bloating 06/09/2013  . Feeling bilious 06/09/2013  . Iron deficiency anemia 04/11/2013  . Unstable angina ([place]) 04/11/2013  . CAD (coronary artery disease) of artery bypass graft 04/03/2013  . Chronic diastolic CHF (congestive heart failure) ([place]) 04/03/2013  . Anemia 04/03/2013  . Arterial vascular disease 03/12/2013  . Aorta disorder ([place]) 03/12/2013  . Atrial fibrillation ([place]) 03/12/2013  . Artificial cardiac pacemaker 03/12/2013  . CCF (congestive cardiac failure) ([place]) 03/12/2013  . Chronic kidney disease (CKD), stage III (moderate) ([place]) 03/12/2013  . Gastric reflux 03/12/2013  . 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 Big thyroid 03/12/2013  . H/O Hodgkin's lymphoma 03/12/2013  . BP (high  blood pressure) 03/12/2013  . Fitting and adjustment of insulin pump 03/12/2013  . Disorder of bone and cartilage, unspecified 03/12/2013  . Aortic heart valve narrowing 03/12/2013  . Encounter for fitting or adjustment of insulin pump 03/12/2013  . Congestive heart failure ([place]) 03/12/2013  . Ischemic colitis ([place]) 03/12/2013  . Lipoma of neck 09/23/2012  . Pacemaker 09/08/2012  . Chronic  kidney disease, stage 3/cardiorenal syndrome 08/15/2012  . Atrial flutter ([place]) 07/23/2012  . Sick sinus syndrome ([place]) 07/21/2012  . Hyperplastic gastric polyp 06/23/2012  . Gastroparesis due to DM ([place]) 05/01/2012  . Hypercalcemia   . Cholelithiasis 07/20/2011  . Aortic stenosis status post 19 mm Edwards pericardial valve replacement 2011   . Arteriosclerotic cardiovascular disease (ASCVD)   . Osteopenia   . Anemia, normocytic normochromic   . NASH (nonalcoholic steatohepatitis)   . Renal cell carcinoma (HCC)     Class: History of  . Duodenal ulcer     Class: History of  . Ischemic colitis     Class: History of  . Gastroesophageal reflux disease   . Hodgkin's disease ([place])   . Hypertension   . Small bowel obstruction ([place])   . Hyperlipidemia 04/11/2010  . Diabetes mellitus type 2, controlled ([place]) 03/23/2010  . Colonic polyps, adenomatous 09/29/2009    Outpatient Encounter Medications as of 09/13/2017  Medication Sig  . Alirocumab (PRALUENT) 150 MG/ML SOPN Inject 1 pen into the skin every 14 (fourteen) days.  . ALPRAZolam (XANAX) 0.5 MG tablet Take 0.5 mg by mouth at bedtime. May take additional dose daily for anxiety  . amLODipine (NORVASC) 5 MG tablet Take 0.5 tablets (2.5 mg total) by mouth daily.  Marland Kitchen amLODipine (NORVASC) 5 MG tablet Take 0.5 tablets (2.5 mg total) by mouth daily.  . APIDRA 100 UNIT/ML injection Inject 46 Units into the skin as directed. INFUSE THROUGH INSULIN PUMP UTD::[place] 46.15 units daily, [place] depends on carb intake  . aspirin EC 81 MG tablet Take 81 mg by mouth at bedtime.  Marland Kitchen azithromycin (ZITHROMAX) 500 MG tablet 1 tablet by mouth one hour prior to dental appointment/procedure  . Calcium Carbonate-Vitamin D (RA CALCIUM PLUS VITAMIN D) 600-400 MG-UNIT per tablet Take 1 tablet by mouth daily.   . Cholecalciferol 2000 UNITS TABS Take 1 tablet by mouth every morning.   . clobetasol (OLUX) 0.05 % topical foam Apply 1 application topically daily.  .  Coenzyme Q10 (CO Q 10) 100 MG CAPS Take 1 capsule by mouth at bedtime.   . colchicine 0.6 MG tablet Take 0.6 mg by mouth at bedtime. And,Take 1 tablet at onset and repeate in 1 hour and then 1 daily x 2 (FOR FLARES)   . docusate sodium (COLACE) 100 MG capsule Take 100 mg by mouth at bedtime.  . Fe Fum-FA-B Cmp-C-Zn-Mg-Mn-Cu (HEMATINIC PLUS COMPLEX) 106-1 MG TABS Take 1 tablet by mouth every Monday, Wednesday, and Friday. Take 1 tablet oraly three times a week or as directed daily with with a Vitamin C tablet  . glucagon (GLUCAGON EMERGENCY) 1 MG injection Inject 1 mg into the vein once as needed (for severe reaction). Reported on 12/05/2015  . isosorbide mononitrate (IMDUR) 120 MG 24 hr tablet Take 120 mg by mouth daily.  . isosorbide mononitrate (IMDUR) 60 MG 24 hr tablet Take 2 tablets (120 mg total) by mouth daily.  Marland Kitchen latanoprost (XALATAN) 0.005 % ophthalmic solution Place 1 drop into both eyes at bedtime.  . Magnesium Oxide 250 MG TABS Take 250 mg by mouth  at bedtime.   . metolazone (ZAROXOLYN) 2.5 MG tablet Take 2.5 mg (1 tab) once as directed by CHF clinic. Call before taking [phone]  . metoprolol tartrate (LOPRESSOR) 50 MG tablet TAKE 1 AND 1/2 TABLETS(75 MG) BY MOUTH TWICE DAILY  . Multiple Vitamins-Minerals (MULTIVITAMIN WITH MINERALS) tablet Take 1 tablet by mouth every morning.   . nitroGLYCERIN (NITROLINGUAL) 0.4 MG/SPRAY spray Place 1 spray under the tongue every 5 (five) minutes as needed for chest pain.  . pantoprazole (PROTONIX) 40 MG tablet Take 1 tablet (40 mg total) by mouth daily.  Vladimir Faster Glycol-Propyl Glycol (SYSTANE) 0.4-0.3 % SOLN Apply 1 drop to eye daily as needed (dry eyes).  . potassium chloride (K-DUR,KLOR-CON) 10 MEQ tablet TAKE 6 CAPSULES(60 MEQ) BY MOUTH TWICE DAILY  . potassium chloride (MICRO-K) 10 MEQ CR capsule Take 4 capsules (40 mEq total) by mouth 2 (two) times daily.  . pravastatin (PRAVACHOL) 10 MG tablet TAKE 1 TABLET BY MOUTH ONCE DAILY  .  spironolactone (ALDACTONE) 25 MG tablet Take 0.5 tablets (12.5 mg total) by mouth daily.  Marland Kitchen terconazole (TERAZOL 7) 0.4 % vaginal cream Place 1 applicator vaginally at bedtime.  . ticagrelor (BRILINTA) 60 MG TABS tablet TAKE 1 TABLET(60 MG) BY MOUTH TWICE DAILY  . torsemide (DEMADEX) 20 MG tablet Take 80 mg (4 tabs) in AM, then 60 mg (3 tabs) in PM  . triamcinolone cream (KENALOG) 0.5 % Apply 1 application topically daily as needed (inflamation).   Marland Kitchen ULORIC 40 MG tablet Take 40 mg by mouth at bedtime.   . vitamin C (ASCORBIC ACID) 500 MG tablet Take 500 mg by mouth 3 (three) times a week.  . loratadine (CLARITIN) 10 MG tablet Take 1 tablet (10 mg total) by mouth daily.   No facility-administered encounter medications on file as of 09/13/2017.     Allergies  Allergen Reactions  . Avandia [Rosiglitazone Maleate] Other (See Comments)    Congestive heart failure  . Cephalexin Anaphylaxis, Swelling and Rash    Extremities swell , throat swelling   . Clindamycin Hcl Anaphylaxis and Shortness Of Breath  . Lincomycin Anaphylaxis and Shortness Of Breath  . Metformin Other (See Comments)    ? Congestive heart failure ?  Marland Kitchen Novolog [Insulin Aspart] Hives and Itching    Humalog & Novolog big knots and whelp    . Penicillins Anaphylaxis, Hives, Swelling and Rash    Has patient had a PCN reaction causing immediate rash, facial/tongue/throat swelling, SOB or lightheadedness with hypotension: Yes Has patient had a PCN reaction causing severe rash involving mucus membranes or skin necrosis: No Has patient had a PCN reaction that required hospitalization No Has patient had a PCN reaction occurring within the last 10 years: No If all of the above answers are "NO", then may proceed with Cephalosporin use.   . Tizanidine Other (See Comments)    Dizziness, Mental Status Changes, Hallucination    . Adhesive [Tape] Other (See Comments)    Tears skin, Please use "paper" tape  . Atorvastatin Other (See  Comments)    increased LFT's, no muscle weakness, leg pain Muscle and joint pain increased LFT's   . Cholestyramine Other (See Comments)    Liver Disorder Elevated LFTs   . Crestor [Rosuvastatin] Nausea And Vomiting, Nausea Only and Other (See Comments)    Muscle and joint pain & increased LFTs; tolerates Pravastatin 79m (max)  . Gentamycin [Gentamicin] Hives, Itching and Rash    Fine red bumps.  Reaction noted post loop recorder  implant, where gentamycin was used for irrigation. Topical prep  . Humalog [Insulin Lispro] Hives, Itching and Other (See Comments)    Pt uses Apidra   . Iodinated Diagnostic Agents Other (See Comments)    PAIN DURING LYMPHANGIOGRAM '81, NO ALLERGY TO IV CONTRAST. PAIN DURING LYMPHANGIOGRAM '86, NO ALLERGY TO IV CONTRAST  . Limonene Hives, Itching, Rash and Other (See Comments)    Reaction noted post loop recorder implant, where gentamycin was used for irrigation. Topical prep  . Pravastatin Other (See Comments)    In high doses (20-42m) causes muscle and joint pain   . Prednisone Itching and Other (See Comments)    B/P went high, itching all over   . Simvastatin Other (See Comments)    Increased LFT's  . Strawberry Extract Hives, Itching, Rash and Other (See Comments)    Reaction noted post loop recorder implant, where gentamycin was used for irrigation. Topical prep  . Doxycycline     Blisters in mouth  . Plavix [Clopidogrel Bisulfate] Other (See Comments)    P2Y12 testing = 271 while on Plavix  . Codeine Nausea And Vomiting  . Erythromycin Nausea And Vomiting  . Erythromycin Base Nausea And Vomiting  . Hydrocodone-Acetaminophen Itching, Rash and Other (See Comments)    itching  . Latex Itching and Other (See Comments)    I"if wear gloves hands get red ,itchy no prioblem if other wear and touch me"  . Neomycin Rash and Other (See Comments)    Redness  . Nickel Itching  . Ranitidine Nausea Only  . Tamiflu [Oseltamivir] Nausea And Vomiting  .  Tramadol Nausea Only    Review of Systems  Constitutional: Positive for activity change, appetite change, chills, fatigue and fever.       All improving from shot reaction  HENT: Positive for mouth sores. Negative for congestion and sore throat.   Eyes: Negative for redness and visual disturbance.  Respiratory: Negative for cough and shortness of breath.   Cardiovascular: Negative for chest pain and palpitations.  Gastrointestinal: Negative for constipation, diarrhea and vomiting.  Musculoskeletal: Positive for myalgias.       Also improving  Skin: Positive for color change.       Blisters inside mouth, shot reaction on arm  Neurological: Negative for headaches.       Headaches are improving    BP (!) 138/54 (BP Location: Right Arm, Patient Position: Sitting, Cuff Size: Normal)   Pulse 72   Temp (!) 97.5 F (36.4 C) (Oral)   Resp 16   Ht 5' 2"  (1.575 m)   Wt 117 lb 0.6 oz (53.1 kg)   SpO2 100%   BMI 21.41 kg/m   Physical Exam  Constitutional: She appears well-developed and well-nourished.  No acute distress.  Pleasant  HENT:  Head: Normocephalic and atraumatic.  Mouth/Throat: Oropharynx is clear and moist.  Ruptured blister on soft palate towards the right molars, hemorrhagic edges  Eyes: Pupils are equal, round, and reactive to light.  Neck: Normal range of motion.  Musculoskeletal:       Back:  Lymphadenopathy:    She has no cervical adenopathy.    ASSESSMENT/PLAN:  1. Reaction to shot, initial encounter We discussed the shot reaction.  Over-the-counter medicines for treatment.  Expect improvement over time.  Discussed the mouth blisters.  Uncertain etiology.  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   Patient Instructions  Ice to area claritin twice a day Benadryl as needed in addition Call for problems  Raylene Everts, MD

## 2017-09-17 ENCOUNTER — Encounter (HOSPITAL_COMMUNITY): Payer: Medicare Other

## 2017-09-17 ENCOUNTER — Telehealth: Payer: Self-pay | Admitting: Family Medicine

## 2017-09-17 NOTE — Telephone Encounter (Signed)
It was in the faxes this morning, I set it in your tray.

## 2017-09-17 NOTE — Telephone Encounter (Signed)
Did you receive an order form from [facility] for a colonoscopy ? If not, they are requesting an order form signed by doctor. Says they faxed it on 09/06/17. Fax#: [phone] Attention: Sharyn Lull.

## 2017-09-17 NOTE — Telephone Encounter (Signed)
I do not recall receiving this. You can check in Dr. Roma Kayser box (2nd shelf on the book case) to see if I set it there. If its not there, I do not think we got one.

## 2017-09-18 NOTE — Telephone Encounter (Signed)
Signed. Gwen Her. Mannie Stabile, MD

## 2017-09-19 ENCOUNTER — Encounter: Payer: Self-pay | Admitting: Family Medicine

## 2017-09-19 ENCOUNTER — Encounter (HOSPITAL_COMMUNITY): Payer: Medicare Other

## 2017-09-23 ENCOUNTER — Other Ambulatory Visit: Payer: Self-pay | Admitting: Hematology and Oncology

## 2017-09-23 DIAGNOSIS — D509 Iron deficiency anemia, unspecified: Secondary | ICD-10-CM

## 2017-09-24 ENCOUNTER — Encounter (HOSPITAL_COMMUNITY): Payer: Medicare Other

## 2017-09-24 ENCOUNTER — Telehealth: Payer: Self-pay | Admitting: Family Medicine

## 2017-09-24 MED ORDER — UNABLE TO FIND: 0 refills | Status: DC

## 2017-09-24 NOTE — Telephone Encounter (Signed)
CPAP cleaning system ordered

## 2017-09-26 ENCOUNTER — Encounter (HOSPITAL_COMMUNITY): Payer: Medicare Other

## 2017-10-05 ENCOUNTER — Other Ambulatory Visit: Payer: Self-pay | Admitting: Internal Medicine

## 2017-10-08 ENCOUNTER — Ambulatory Visit (HOSPITAL_COMMUNITY)
Admission: RE | Admit: 2017-10-08 | Discharge: 2017-10-08 | Disposition: A | Discharge status: Home / Self Care | Payer: Medicare Other | Source: Ambulatory Visit | Attending: Cardiology | Admitting: Cardiology

## 2017-10-08 ENCOUNTER — Encounter (HOSPITAL_COMMUNITY): Payer: Self-pay | Admitting: Cardiology

## 2017-10-08 ENCOUNTER — Other Ambulatory Visit: Payer: Self-pay

## 2017-10-08 VITALS — BP 134/56 | HR 78 | Wt 122.0 lb

## 2017-10-08 DIAGNOSIS — I480 Paroxysmal atrial fibrillation: Secondary | ICD-10-CM | POA: Diagnosis not present

## 2017-10-08 DIAGNOSIS — Z794 Long term (current) use of insulin: Secondary | ICD-10-CM | POA: Diagnosis not present

## 2017-10-08 DIAGNOSIS — Z950 Presence of cardiac pacemaker: Secondary | ICD-10-CM | POA: Insufficient documentation

## 2017-10-08 DIAGNOSIS — I130 Hypertensive heart and chronic kidney disease with heart failure and stage 1 through stage 4 chronic kidney disease, or unspecified chronic kidney disease: Secondary | ICD-10-CM | POA: Insufficient documentation

## 2017-10-08 DIAGNOSIS — Z905 Acquired absence of kidney: Secondary | ICD-10-CM | POA: Insufficient documentation

## 2017-10-08 DIAGNOSIS — I25110 Atherosclerotic heart disease of native coronary artery with unstable angina pectoris: Secondary | ICD-10-CM | POA: Diagnosis present

## 2017-10-08 DIAGNOSIS — Z923 Personal history of irradiation: Secondary | ICD-10-CM | POA: Insufficient documentation

## 2017-10-08 DIAGNOSIS — M109 Gout, unspecified: Secondary | ICD-10-CM | POA: Insufficient documentation

## 2017-10-08 DIAGNOSIS — Z7982 Long term (current) use of aspirin: Secondary | ICD-10-CM | POA: Insufficient documentation

## 2017-10-08 DIAGNOSIS — I5032 Chronic diastolic (congestive) heart failure: Secondary | ICD-10-CM | POA: Diagnosis not present

## 2017-10-08 DIAGNOSIS — Z8571 Personal history of Hodgkin lymphoma: Secondary | ICD-10-CM | POA: Diagnosis not present

## 2017-10-08 DIAGNOSIS — D509 Iron deficiency anemia, unspecified: Secondary | ICD-10-CM | POA: Insufficient documentation

## 2017-10-08 DIAGNOSIS — Z951 Presence of aortocoronary bypass graft: Secondary | ICD-10-CM | POA: Insufficient documentation

## 2017-10-08 DIAGNOSIS — Q231 Congenital insufficiency of aortic valve: Secondary | ICD-10-CM | POA: Insufficient documentation

## 2017-10-08 DIAGNOSIS — Z7902 Long term (current) use of antithrombotics/antiplatelets: Secondary | ICD-10-CM | POA: Insufficient documentation

## 2017-10-08 DIAGNOSIS — I495 Sick sinus syndrome: Secondary | ICD-10-CM

## 2017-10-08 DIAGNOSIS — Z953 Presence of xenogenic heart valve: Secondary | ICD-10-CM | POA: Diagnosis not present

## 2017-10-08 DIAGNOSIS — Z9013 Acquired absence of bilateral breasts and nipples: Secondary | ICD-10-CM | POA: Diagnosis not present

## 2017-10-08 DIAGNOSIS — Z853 Personal history of malignant neoplasm of breast: Secondary | ICD-10-CM | POA: Insufficient documentation

## 2017-10-08 DIAGNOSIS — I2510 Atherosclerotic heart disease of native coronary artery without angina pectoris: Secondary | ICD-10-CM

## 2017-10-08 DIAGNOSIS — E1022 Type 1 diabetes mellitus with diabetic chronic kidney disease: Secondary | ICD-10-CM | POA: Insufficient documentation

## 2017-10-08 DIAGNOSIS — E785 Hyperlipidemia, unspecified: Secondary | ICD-10-CM | POA: Diagnosis not present

## 2017-10-08 DIAGNOSIS — N183 Chronic kidney disease, stage 3 (moderate): Secondary | ICD-10-CM | POA: Diagnosis not present

## 2017-10-08 DIAGNOSIS — Z79899 Other long term (current) drug therapy: Secondary | ICD-10-CM | POA: Insufficient documentation

## 2017-10-08 DIAGNOSIS — Z91048 Other nonmedicinal substance allergy status: Secondary | ICD-10-CM | POA: Diagnosis not present

## 2017-10-08 DIAGNOSIS — G4733 Obstructive sleep apnea (adult) (pediatric): Secondary | ICD-10-CM | POA: Diagnosis not present

## 2017-10-08 DIAGNOSIS — I208 Other forms of angina pectoris: Secondary | ICD-10-CM

## 2017-10-08 DIAGNOSIS — R079 Chest pain, unspecified: Secondary | ICD-10-CM

## 2017-10-08 MED ORDER — RANOLAZINE ER 500 MG PO TB12: 500.0000 mg | ORAL_TABLET | Freq: Two times a day (BID) | ORAL | 6 refills | Status: DC

## 2017-10-08 MED ORDER — TORSEMIDE 20 MG PO TABS: 80.0000 mg | ORAL_TABLET | Freq: Two times a day (BID) | ORAL | 6 refills | Status: DC

## 2017-10-08 NOTE — Progress Notes (Signed)
Patient ID: [name] [name], female   DOB: [birth year], 67 y.o.   MRN: [id number] PCP: [name]  Cardiology: Dr. Aundra Dubin  67 yo with history of CAD s/p CABG in 12/05 and redo in 1/11, bioprosthetic AVR, diastolic CHF, and sick sinus syndrome s/p PCM presents for cardiology followup.  She had initial CABG with SVG-RCA in 12/05 followed by redo SVG-RCA and bioprosthetic AVR in 1/11.  She has had diastolic CHF and is on Lasix.  In 9/14, she had a CPX test.  This showed severe functional limitation with ischemic ECG changes (inferolateral ST depression and ST elevation in V1 and V2). She had chest pain.  She had R/LHC in 9/14.  This showed elevated left and right heart filling pressures, patent SVG-PDA, and 80% distal LM/80% ostial LAD stenosis.  She was not a candidate for redo CABG (had 2 prior sternotomies as well as chest radiation for Hodgkins lymphoma).  She had DES from left main into proximal LAD and will need long-term DAPT => Brilinta was used as she has been a poor responder to Plavix in the past.  She re-developed angina in 11/15 and was admitted with unstable angina.  LHC showed 80% ostial LM and patent SVG-RCA. She had DES to ostial LM. Echo in 1/18 showed EF 60-65%, normal bioprosthetic aortic valve, mild-moderate MR, PASP 35 mmHg.   She has chronic iron deficiency anemia thought to be due to a slow GI bleed.  Last EGD was unremarkable and a capsule endoscopy was also unremarkable.  She has had periodic transfusions; However, recently hemoglobin has been stable.    She had bilateral mastectomy for DCIS in 59/93 without complication.  She has an insulin pump followed by an endocrinologist at [facility].   Given myalgias with statins, she was started on Praluent.  LDL is now excellent.   In the past she has had short runs of atrial fibrillation.  She is not anticoagulated. Planned for Watchman placement, but given her nickel allergy, we were unable to place a Watchman.    Cardiolite in 2/18  showed no ischemia or infarction.     Diagnosed with OSA, now on CPAP.   Given ongoing problems with exertional dyspnea and occasional chest pain, Cardiolite was repeated in 9/18 and showed inferior changes.  She underwent left/right heart cath in 9/18.  This showed elevated filling pressures, R>L.  There was 75+% ostial LCx stenosis.  However, there were 2 layers of stent from the mid left main into the ostial LAD, and LCx intervention was thought to be very risky.  It was decided to manage her medically.  Echo was repeated in 10/18 showing EF 55-60%, moderate to severe TR.    She returns today for follow up of CHF and CAD. She is gong to cardiac rehab and was doing well until 10 days ago. She was sitting and felt her heart pounding so hard that her head was rocking back and forth. She felt like she was in afib afterward for about 35-40 minutes and had some associated chest tightness, which was relieved with nitro. She has also been having worsening SOB and chest tightness with walking 10-15 feet and walking uphill, which was relieved with nitro spray. She has associated nausea and dizziness during these episodes. Feels similar to cardiac CP before, but worse. Worsening edema in BLE and abdomen. Sleeps sitting up on adjustable bed. No PND. She has gained 5 lbs in the last week on her scale at home. She had been eating out  a lot on vacation in [place]. Compliant with medications.   Labs (7/14): hemoglobin 11 Labs (9/14): K 3.9, creatinine 1.48, hemoglobin 7.3 Labs (10/14): hemoglobin 7.7 Labs (11/14): K 3.8, creatinine 1.5 Labs (12/14): HCT 37.7 Labs (1/15): LDL 106, HDL 41, K 3.9, creatinine 1.6, LFTs normal Labs (2/15): K 3.7, creatinine 1.1, BNP 71 Labs (3/15): HCT 36.7 Labs (4/15): K 3.7, creatinine 1.1, HCT 35.5 Labs (7/15): HCT 36.4 Labs (11/15): HCT 32.1, K 4.2, creatinine 1.06 Labs (12/15): hgb 12 Labs (1/16): HCT 37.1 Labs (3/16): LDL 25, HDL 42, elevated transaminases Labs (6/16):  HCT 39.6, plts 146 Labs (7/16): LDL 59, HDL 41, AST 34, ALT 49, AP 125 Labs (11/16): HCT 38.8, alkaline phosphatase 156, AST/ALT normal, K 4, creatinine 1.0 Labs (3/17): K 4.4, creatinine 1.4, LFTs normal, hgb 12.5 Labs (5/17): HCT 41.8, K 4, creatinine 1.3 Labs (9/17): K 4, creatinine 0.43, hgb 13.6 Labs (10/17): LDL 42, HDL 43 Labs (1/18): K 4.3, creatinine 1.1 => 1.19, AST 66, ALT 67, hgb 14.2, BNP 72 Labs (2/18): K 3.9, creatinine 1.2, BNP 49.6, HCT 42.1 Labs (4/18): hgb 13.8 Labs (5/18): HCT 41.7, plts 98, K 4.3, creatinine 1.11, BNP 180 Labs (7/18): hgb 12, plts 110, K 4, creatinine 1.23, AST 38, ALT 44 Labs (8/18): K 5, creatinine 1.17 => 2.1, BUN 33 => 92, BNP 134 Labs (9/18): K 3.6, creatinine 1.42 => 1.44. Hgb 14 => 13.5 Labs (10/18): K 3.5, creatinine 1.21, hgb 11.9, LDL 16, HDL 40 Labs (12/18): K 3.7, creatinine 1.4 Labs (2/19): hgb 13.2 Labs @ [facility] (3/19): K 4.7, creatinine 1.4  ECG (personally reviewed): NSR, 1st degree AVB, left axis deviation  PMH: 1. Sick sinus syndrome s/p [employer] PCM.  2. HTN 3. H/o SBO 4. Renal cell carcinoma s/p right nephrectomy in 2008.  5. 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  Fibroscan in 11/14 showed advanced fibrosis concerning for cirrhosis. EGD in 11/14 showed no varices.  6. Diastolic CHF: Echo (1/09) with EF 60-65%, bioprosthetic aortic valve with mean gradient 15 mmHg, mild MR.  CPX (9/14): peak VO2 10.4, VE/VCO2 43.8, inferolateral ST depression and ST elevation in V1/V2 with severe dyspnea and chest pain, severe functional limitation with ischemic changes.  Echo (9/14) with EF 60-65%, normal RV size and systolic function, mild-moderate MR, bioprosthetic aortic valve normal, PA systolic pressure 51 mmHg, no evidence for pericardial constriction though exam incomplete for constriction.  Echo (11/15) with EF 55-60%, bioprosthetic aortic valve, mild-moderate MR, moderate TR, PA systolic pressure 42 mmHg.  - TEE (5/17) with EF 60-65%,  normal bioprosthetic aortic valve with mean gradient 13 mmHg, normal RV size and systolic function. - Echo (1/18) with EF 60-65%, normal bioprosthetic aortic valve, mild-moderate MR, PASP 35 mmHg.  - Echo (10/18) with EF 55-60%, bioprosthetic aortic valve mean gradient 10 mmHg, MAC with mild mitral stenosis (mean gradient 7) and mild mitral regurgitation, moderate to severe TR, PASP 43 mmHg.  - RHC (9/18): mean RA 13, PA 44/18 mean 33, mean PCWP 19, CI 3.45, PVR 2.7 WU.  7. TAH-BSO 8. Type I diabetes: Has insulin pump.  9. H/o ischemic colitis. 10. H/o PUD. 11. Diabetic gastroparesis. 12. Hyperlipidemia: Myalgias with Crestor and atorvastatin, elevated LFTs with simvastatin.  Myalgias with > 10 mg pravastatin. Now on Praluent.  13. CKD 14. Bicuspid aortic valve s/p 19 mm Edwards pericardial valve in 1/11 (had post-operative Dresslers syndrome).   15. CAD: CABG with SVG-RCA in 12/05.  Redo SVG-RCA in 1/11 with AVR.  LHC/RHC (9/14)  with mean RA 18, PA 62/25 mean 43, mean PCWP 26, CI 2.8, 70-80% distal LM stenosis, 80% ostial LAD stenosis, total occlusion RCA, SVG-PDA patent.  Patient had DES LM into LAD.  Plan to continue long-term ASA/Brilinta (poor responder to Plavix).  ETT-cardiolite (4/15) with 6' exercise, EF 60%, ST depression in recovery and mild chest pain, no ischemia or infarction by perfusion images.  Unstable angina 11/15 with 80% LM, patent SVG-RCA => DES to ostial LM.   - Lexiscan Cardiolite (2/18): EF 62%, no ischemia or infarction (normal).  She had a significant reaction to [place] and probably should not get again.  - Cardiolite (9/18): EF 72%, prior inferior infarct with mild peri-infarct ischemia.  - LHC (9/18): 2 layers stent left main - ostial LAD with 50% dLM in-stent restenosis, 75+% ostial LCx, totally occluded RCA, SVG-RCA patent.  Medical management planned.  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  17. Anemia: Iron-deficiency.   Suspect chronic GI blood loss.  EGD in 9/14 was unremarkable.  Capsule endoscopy 10/14 was unremarkable.  Has required periodic blood transfusions.  18. Atrial fibrillation: Paroxysmal, noted by [facility] interrogation.  19. Carotid stenosis: Carotid dopplers (2/15) with 40-59% bilateral stenosis. Carotid dopplers (3/16) with 40-59% bilateral ICA stenosis. Carotid dopplers (3/17) with 40-59% bilateral ICA stenosis.  - Carotid dopplers (3/18) with mild BICA stenosis.  20. 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  22. C difficile colitis 2017 23. Gout 24. ?TIA: head CT negative.  25. OSA: Moderate, uses CPAP.  26. Chronic thrombocytopenia  SH: Married, lives in [place], nonsmoker. Former school principal.  FH: CAD  ROS: All systems reviewed and negative except as per HPI.   Current Outpatient Medications  Medication Sig Dispense Refill  . Alirocumab (PRALUENT) 150 MG/ML SOPN Inject 1 pen into the skin every 14 (fourteen) days. 2 pen 11  . ALPRAZolam (XANAX) 0.5 MG tablet Take 0.5 mg by mouth at bedtime. May take additional dose daily for anxiety    . amLODipine (NORVASC) 5 MG tablet Take 0.5 tablets (2.5 mg total) by mouth daily. 45 tablet 3  . APIDRA 100 UNIT/ML injection Inject 46 Units into the skin as directed. INFUSE THROUGH INSULIN PUMP UTD::[place] 46.15 units daily, Bolus depends on carb intake  2  . aspirin EC 81 MG tablet Take 81 mg by mouth at bedtime.    Marland Kitchen azithromycin (ZITHROMAX) 500 MG tablet 1 tablet by mouth one hour prior to dental appointment/procedure 2 tablet 0  . Calcium Carbonate-Vitamin D (RA CALCIUM PLUS VITAMIN D) 600-400 MG-UNIT per tablet Take 1 tablet by mouth daily.     . Cholecalciferol 2000 UNITS TABS Take 1 tablet by mouth every morning.     . clobetasol (OLUX) 0.05 % topical foam Apply 1 application topically daily.  0  . Coenzyme Q10 (CO Q  10) 100 MG CAPS Take 1 capsule by mouth at bedtime.     . colchicine 0.6 MG tablet Take 0.6 mg by mouth at bedtime. And,Take 1 tablet at onset and repeate in 1 hour and then 1 daily x 2 (FOR FLARES)     . docusate sodium (COLACE) 100 MG capsule Take 100 mg by mouth at bedtime.    . Fe Fum-FA-B Cmp-C-Zn-Mg-Mn-Cu (HEMATINIC PLUS VIT/MINERALS) 106-1 MG TABS TAKE 1 TABLET BY MOUTH THREE TIMES A WEEK(MONDAY, WEDNESDAY, AND FRIDAY) OR AS DIRECTED WITH A VITAMIN "C"  90 tablet 0  . glucagon (GLUCAGON EMERGENCY) 1 MG injection Inject 1 mg into the vein once as needed (for severe reaction). Reported on 12/05/2015    . isosorbide mononitrate (IMDUR) 120 MG 24 hr tablet Take 120 mg by mouth daily.    Marland Kitchen latanoprost (XALATAN) 0.005 % ophthalmic solution Place 1 drop into both eyes at bedtime.    Marland Kitchen loratadine (CLARITIN) 10 MG tablet Take 1 tablet (10 mg total) by mouth daily. 30 tablet 11  . Magnesium Oxide 250 MG TABS Take 250 mg by mouth at bedtime.     . metolazone (ZAROXOLYN) 2.5 MG tablet Take 2.5 mg (1 tab) once as directed by CHF clinic. Call before taking [phone] 3 tablet 0  . metoprolol tartrate (LOPRESSOR) 50 MG tablet TAKE 1 AND 1/2 TABLETS(75 MG) BY MOUTH TWICE DAILY 270 tablet 2  . Multiple Vitamins-Minerals (MULTIVITAMIN WITH MINERALS) tablet Take 1 tablet by mouth every morning.     . nitroGLYCERIN (NITROLINGUAL) 0.4 MG/SPRAY spray Place 1 spray under the tongue every 5 (five) minutes as needed for chest pain. 12 g 1  . pantoprazole (PROTONIX) 40 MG tablet Take 1 tablet (40 mg total) by mouth daily. 30 tablet 11  . Polyethyl Glycol-Propyl Glycol (SYSTANE) 0.4-0.3 % SOLN Apply 1 drop to eye daily as needed (dry eyes).    . potassium chloride (MICRO-K) 10 MEQ CR capsule Take 4 capsules (40 mEq total) by mouth 2 (two) times daily. 240 capsule 3  . pravastatin (PRAVACHOL) 10 MG tablet TAKE 1 TABLET BY MOUTH ONCE DAILY 90 tablet 3  . spironolactone (ALDACTONE) 25 MG tablet Take 0.5 tablets (12.5 mg  total) by mouth daily. 45 tablet 3  . terconazole (TERAZOL 7) 0.4 % vaginal cream Place 1 applicator vaginally at bedtime. 45 g 0  . ticagrelor (BRILINTA) 60 MG TABS tablet TAKE 1 TABLET(60 MG) BY MOUTH TWICE DAILY 180 tablet 2  . torsemide (DEMADEX) 20 MG tablet Take 80 mg (4 tabs) in AM, then 60 mg (3 tabs) in PM 210 tablet 6  . triamcinolone cream (KENALOG) 0.5 % Apply 1 application topically daily as needed (inflamation).     Marland Kitchen ULORIC 40 MG tablet Take 40 mg by mouth at bedtime.   [place] Med Name: So Clean CPAP cleaning system 1 each 0  . vitamin C (ASCORBIC ACID) 500 MG tablet Take 500 mg by mouth 3 (three) times a week.     No current facility-administered medications for this encounter.    Filed Weights   10/08/17 1437  Weight: 122 lb (55.3 kg)    BP (!) 134/56   Pulse 78   Wt 122 lb (55.3 kg)   SpO2 100%   BMI 22.31 kg/m  General: thin No resp difficulty. HEENT: Normal Neck: Supple. JVP 10-12. Carotids 2+ bilat; no bruits. No thyromegaly or nodule noted. Cor: PMI nondisplaced. RRR, no S3/S4, 2/6 early SEM RUSB with clear S2.  Lungs: Diminished RLL Abdomen: Soft, non-tender, +mild distension, no HSM. No bruits or masses. +BS  Extremities: No cyanosis, clubbing, or rash. R and LLE no edema.  Neuro: Alert & orientedx3, cranial nerves grossly intact. moves all 4 extremities w/o difficulty. Affect pleasant  Assessment/Plan:  1. CAD: s/p SVG-RCA in 12/05, then redo SVG-RCA in 1/11--> distal left main/proximal LAD severe stenosis in 9/14.  PCI with DES to distal LM/proximal LAD was done rather than bypass as she would have been a poor candidate for redo sternotomy (2 prior sternotomies  and history of chest wall radiation for Hodgkins disease).  Recurrent unstable angina with 80% ostial LM treated with repeat DES in 11/15.  Lexiscan Cardiolite in 9/18 inferior changes.  Repeat LHC (9/18) showed 50% distal left main in-stent restenosis and 75+% ostial LCx stenosis. Dr  Aundra Dubin reviewed the films with interventional cardiology => would be very difficult to intervene on the ostial LCx through 2 overlapping stents from left main into the LAD.  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  Given no ACS, we planned medical management and increased imdur. She has been having chest tightness with SOB on exertion. - Continue Brilinta 60 mg bid (poor responder to plavix) long-term given recurrent left main disease.  This has been complicated in the past by suspected slow GI bleed. Most recent hemoglobin was normal. - Continue ASA 81, statin, metoprolol, and Imdur 120 mg daily.    - Add Ranexa 500 mg BID - EKG today with no ST changes - Set up outpatient treadmill Cardiolite. Pt instructed to hold BB night before and day of stress test.  2. Bioprosthetic aortic valve replacement: In setting of bicuspid aortic valve, thoracic aorta not dilated on 2017 TEE.  Valve looked ok on 10/18 echo.   3. Chronic diastolic CHF: Most recent echo in 10/18 showed normal LV systolic function but also showed moderate to severe TR.  RHC was concerning for right > left heart failure.  I am concerned that her volume overload and dyspnea are related to primarily RV failure at this point. NYHA class II-III. Volume elevated on exam. - Increase torsemide to 80 mg BID. Take metolazone 2.5 mg Wednesday and Friday. Reviewed labwork from [facility] 3/18. BMET in 10 days. - Continue spironolactone 12.5 mg daily. 4. CKD:  Patient has a single kidney s/p right nephrectomy.  CKD stage 3.  Creatinine 1.4 yesterday at [facility]. Check BMET in 10 days 5. Hyperlipidemia: She has not been able to tolerate any higher potency statin than pravastatin 10 mg daily.   - Continue pravastatin, good lipids 10/18. .        6. Anemia: Suspected slow GI bleed.  Unremarkable EGD and capsule endoscopy in fall 2014.  Unfortunately, needs to stay on Brilinta long-term (Plavix non-responder).  Will need to follow  closely (sees hematology). Recently, hemoglobin has been more stable.   - Hemoglobin 8.5 on 2/14. She follows up with hematology this week.  7. Atrial fibrillation: Paroxysmal, brief episodes noted on PCM interrogation. Given GI bleeding and need to be on Brilinta, risks have been thought to outweigh benefits of anticoagulation.  She is not a good Multaq candidate with CHF.  If she needs an antiarrhythmic, consider Tikosyn or Sotalol.   - Regular on exam and EKG  - We were unable to place Watchman device due to nickel allergy.   8. Carotid bruit: Only mild stenosis on last dopplers in 3/18.  9. SSS: Pacemaker in place.  10. HTN: controlled. Continue current regimen.   11. OSA: Moderate, using CPAP.  12. ?TIA: Transient dysarthria in 3/18, could have been afib-related TIA.  No recurrent symptoms.  13. Thrombocytopenia: Chronic. Sees hematology Thursday.    Follow up in 2 weeks Increase torsemide to 80 mg BID Take metolazone 2.5 mg Wed and Fri BMET in 10 days Set up for treadmill Cardiolite stress test. Hold BB night prior and morning of Start Ranexa 500 mg BID  [place]iana Shore, NP 10/08/2017   Patient seen with NP, agree  with the above note.  She has been having about 2 weeks of increased exertional dyspnea, weight up 5 lbs, and chest tightness with exertion. This appears to have really started during a vacation to [place].    On exam, she is volume overloaded with mild abdominal distention and JVP 10-12 cm. She is in NSR on ECG, no significant ECG changes.   She had a fairly recent cath showing 50% distal LM stenosis and 75+% stenosis ostial LCx, but ostial LCx covered by 2 layers of stent and not amenable to PCI.  She also was not a CABG candidate.  The question here is whether there is new disease that is triggering her increased symptoms or whether she developed a diastolic CHF flare with volume overload that triggered increased anginal symptoms from her known old disease.  - I will add  ranolazine 500 mg bid, will need to follow QT interval on followup ECG.  - I will arrange for an ETT-Cardiolite to see if there are any changes on this study compared to the prior that was done before the last cath.  - She will need to be diuresed: increase torsemide to 80 mg bid, and will give metolazone 2.5 mg with am torsemide tomorrow and again on Friday.  Repeat BMET 10 days.   Followup with me in 2 wks.   [name] 10/08/2017

## 2017-10-08 NOTE — Patient Instructions (Signed)
Increase Torsemide to 80 mg (4 tabs) Twice daily   Take Metolazone 2.5 mg tomorrow and Friday this week  Start Ranexa 500 mg Twice daily   Labs in 10 days  Your physician has requested that you have en exercise stress myoview. For further information please visit [email or web address]. Please follow instruction sheet, as given.  Your physician recommends that you schedule a follow-up appointment in: 2 weeks

## 2017-10-09 ENCOUNTER — Telehealth: Payer: Self-pay | Admitting: Family Medicine

## 2017-10-09 DIAGNOSIS — Z1211 Encounter for screening for malignant neoplasm of colon: Secondary | ICD-10-CM

## 2017-10-09 NOTE — Telephone Encounter (Signed)
Called to check for an appointment date for [facility] Dr.Kenneth Derrill Kay--  She states they need an order faxed to [phone]

## 2017-10-10 ENCOUNTER — Other Ambulatory Visit (HOSPITAL_COMMUNITY): Payer: Self-pay | Admitting: *Deleted

## 2017-10-10 ENCOUNTER — Encounter (HOSPITAL_COMMUNITY): Payer: Self-pay | Admitting: Hematology

## 2017-10-10 ENCOUNTER — Inpatient Hospital Stay (HOSPITAL_COMMUNITY): Payer: Medicare Other

## 2017-10-10 ENCOUNTER — Other Ambulatory Visit: Payer: Self-pay

## 2017-10-10 ENCOUNTER — Inpatient Hospital Stay (HOSPITAL_COMMUNITY): Payer: Medicare Other | Attending: Hematology | Admitting: Hematology

## 2017-10-10 VITALS — BP 139/58 | HR 76 | Temp 98.1°F | Resp 20 | Wt 116.1 lb

## 2017-10-10 DIAGNOSIS — Z923 Personal history of irradiation: Secondary | ICD-10-CM | POA: Insufficient documentation

## 2017-10-10 DIAGNOSIS — E1122 Type 2 diabetes mellitus with diabetic chronic kidney disease: Secondary | ICD-10-CM | POA: Insufficient documentation

## 2017-10-10 DIAGNOSIS — Z853 Personal history of malignant neoplasm of breast: Secondary | ICD-10-CM | POA: Diagnosis not present

## 2017-10-10 DIAGNOSIS — I480 Paroxysmal atrial fibrillation: Secondary | ICD-10-CM | POA: Diagnosis not present

## 2017-10-10 DIAGNOSIS — D696 Thrombocytopenia, unspecified: Secondary | ICD-10-CM

## 2017-10-10 DIAGNOSIS — Z9012 Acquired absence of left breast and nipple: Secondary | ICD-10-CM | POA: Insufficient documentation

## 2017-10-10 DIAGNOSIS — M810 Age-related osteoporosis without current pathological fracture: Secondary | ICD-10-CM | POA: Diagnosis not present

## 2017-10-10 DIAGNOSIS — I130 Hypertensive heart and chronic kidney disease with heart failure and stage 1 through stage 4 chronic kidney disease, or unspecified chronic kidney disease: Secondary | ICD-10-CM | POA: Diagnosis not present

## 2017-10-10 DIAGNOSIS — E785 Hyperlipidemia, unspecified: Secondary | ICD-10-CM | POA: Diagnosis not present

## 2017-10-10 DIAGNOSIS — Z79899 Other long term (current) drug therapy: Secondary | ICD-10-CM | POA: Diagnosis not present

## 2017-10-10 DIAGNOSIS — Z951 Presence of aortocoronary bypass graft: Secondary | ICD-10-CM | POA: Insufficient documentation

## 2017-10-10 DIAGNOSIS — Z8571 Personal history of Hodgkin lymphoma: Secondary | ICD-10-CM | POA: Insufficient documentation

## 2017-10-10 DIAGNOSIS — Z801 Family history of malignant neoplasm of trachea, bronchus and lung: Secondary | ICD-10-CM | POA: Diagnosis not present

## 2017-10-10 DIAGNOSIS — Z7982 Long term (current) use of aspirin: Secondary | ICD-10-CM | POA: Diagnosis not present

## 2017-10-10 DIAGNOSIS — I25110 Atherosclerotic heart disease of native coronary artery with unstable angina pectoris: Secondary | ICD-10-CM

## 2017-10-10 LAB — CBC WITH DIFFERENTIAL/PLATELET
BASOS PCT: 1 %
Basophils Absolute: 0 10*3/uL (ref 0.0–0.1)
Eosinophils Absolute: 0.2 10*3/uL (ref 0.0–0.7)
Eosinophils Relative: 2 %
HEMATOCRIT: 41.4 % (ref 36.0–46.0)
Hemoglobin: 13.5 g/dL (ref 12.0–15.0)
LYMPHS ABS: 1.7 10*3/uL (ref 0.7–4.0)
Lymphocytes Relative: 21 %
MCH: 30.3 pg (ref 26.0–34.0)
MCHC: 32.6 g/dL (ref 30.0–36.0)
MCV: 93 fL (ref 78.0–100.0)
MONO ABS: 0.7 10*3/uL (ref 0.1–1.0)
MONOS PCT: 9 %
NEUTROS ABS: 5.5 10*3/uL (ref 1.7–7.7)
Neutrophils Relative %: 67 %
Platelets: 128 10*3/uL — ABNORMAL LOW (ref 150–400)
RBC: 4.45 MIL/uL (ref 3.87–5.11)
RDW: 13.5 % (ref 11.5–15.5)
WBC: 8.2 10*3/uL (ref 4.0–10.5)

## 2017-10-10 LAB — LACTATE DEHYDROGENASE: LDH: 166 U/L (ref 98–192)

## 2017-10-10 LAB — TECHNOLOGIST SMEAR REVIEW

## 2017-10-10 NOTE — Progress Notes (Signed)
CONSULT NOTE  Patient Care Team: [name], MD as PCP - General (Family Medicine) Rothbart, Cristopher Estimable, MD (Cardiology) Gatha Mayer, MD (Gastroenterology) Altheimer, Legrand Como, MD (Endocrinology) Raynelle Bring, MD (Urology) [name], MD (Hematology and Oncology) [name], MD as Referring Physician (Endocrinology)  CHIEF COMPLAINTS/PURPOSE OF CONSULTATION: Thrombocytopenia.  HISTORY OF PRESENTING ILLNESS:  [name] [name] 67 y.o. female who is seen in consultation today for further workup of thrombocytopenia.  I have reviewed labs which shows decreased platelet count which was mild since 2016.  She had occasional nosebleeds and easy bruising.  She had a PET scan done in March 2016 which showed normal-sized spleen.  She denies any prolonged bleeding after her surgeries.  She denies any fevers, night sweats or weight loss in the last 6 months.  She denies any hospitalizations recently.   MEDICAL HISTORY:  Past Medical History:  Diagnosis Date  . Anxiety   . Aortic stenosis    a. bicuspid aortic valve; mean gradient of 20 mmHg in 2/10; b. s/p bioprosth AVR (19-mm Edwards pericardial valve) with a redo coronary artery bypass graft procedure in 07/2009; postoperative Dressler's syndrome    . Arthritis   . Breast cancer ([place])   . Carcinoma, renal cell ([place]) 05/2007   Laparoscopic right nephrectomy  . 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 Chronic diastolic CHF (congestive heart failure) ([place])    a. 9.2014 EchoP EF 60-65%, no rwma, bioprosth AVR, mean gradient of 18mHg, mild to mod MR, PASP 54mg.  . CKD (chronic kidney disease), stage II   . Colitis, ischemic ([facility]2008  . Coronary artery disease    a. initial CABG with SVG-RCA in 12/05. b. redo SVG-RCA and bioprosthetic AVR in 1/11. d. 9/14 - PCI with DES to distal LM/proximal LAD was done rather than bypass as she would have been a poor candidate for redo sternotomy. d. S/p DES to prox LM 05/2014.  .  Diabetes mellitus 03/2010    TYPE II: Hemoglobin A1c of 7.4 in  03/2010; 8.4 in 06/2010; treated with insulin pump  . Duodenal ulcer    Remote; H. pylori positive  . Gastroparesis due to DM (HCF[place]more10/04/2012  . Genetic testing 01/14/2017   Ms. Buchanan underwent genetic counseling and testing for hereditary cancer syndromes on 01/01/2017. Her results were negative for mutations in all 83 genes analyzed by [facility]'s 83-gene Common Hereditary Cancers Panel. Genes analyzed include: ALK, APC, ATM, AXIN2, BAP1, BARD1, BLM, BMPR1A, BRCA1, BRCA2, BRIP1, CASR, CDC73, CDH1, CDK4, CDKN1B, CDKN1C, CDKN2A, CEBPA, CHEK2, CTNNA1, DICER1, DIS3L2,   . Hodgkin's disease (HC[place]orn[place]1991   M[place]le radiation therapy  . Hodgkin's disease ([place]  . Hyperlipidemia   . Hyperplastic gastric polyp 06/23/2012  . Hypertension   . Iron deficiency anemia    a. 03/2013 EGD: essentially normal. Possible slow GIB.  . Migraines   . NASH (nonalcoholic steatohepatitis) 1999   -biopsy in 1999  . OSA on CPAP 04/24/2017   Moderate with AHI 22/hr now on CPAP at 18cm H2o  . Osteopenia     hip on DEXA in October 2007.  . Osteoporosis   . Oxygen deficiency   . Pacemaker    six sinus syndrome  . PAF (paroxysmal atrial fibrillation) ([facility]   a.  h/o brief episodes noted on ppm interrogation. b. given GI bleeding and need to be on both ASA 81 and Brilinta, risks likely outweigh benefits of anticoagulation.   . Small bowel obstruction ([facility]  Recurrent; resolved after resection of a lipoma  . Syncope    a. H/o recurrent syncope with pauses on loop recorder s/p Medtronic pacemaker 07/2012.    SURGICAL HISTORY: Past Surgical History:  Procedure Laterality Date  . ABDOMINAL HYSTERECTOMY    . AORTIC VALVE REPLACEMENT  2011   Aortic valve replacement surgery with a 19 mm bioprosthetic device post redo CABG January 2011.  Marland Kitchen BONE MARROW BIOPSY    . BREAST EXCISIONAL BIOPSY     benign, bilateral  . CARDIAC CATHETERIZATION    . CERVICAL  BIOPSY    . COLONOSCOPY     Multiple with adenomatous polyps  . COLONOSCOPY  06/23/2012   Procedure: COLONOSCOPY;  Surgeon: Gatha Mayer, MD;  Location: [facility];  Service: Endoscopy;  Laterality: N/A;  . CORONARY ARTERY BYPASS GRAFT  2005, 2011   CABG-most recent SVG to RCA-12/05; RCA occluded with patent graft in 2006; Redo bypass in 07/2009  . ESOPHAGOGASTRODUODENOSCOPY    . ESOPHAGOGASTRODUODENOSCOPY  06/23/2012   Procedure: ESOPHAGOGASTRODUODENOSCOPY (EGD);  Surgeon: Gatha Mayer, MD;  Location: [facility];  Service: Endoscopy;  Laterality: N/A;  . ESOPHAGOGASTRODUODENOSCOPY N/A 04/06/2013   Procedure: ESOPHAGOGASTRODUODENOSCOPY (EGD);  Surgeon: Irene Shipper, MD;  Location: [facility] ENDOSCOPY;  Service: Endoscopy;  Laterality: N/A;  . EXPLORATORY LAPAROTOMY W/ BOWEL RESECTION     Small bowel resection of lipoma  . GIVENS CAPSULE STUDY N/A 04/30/2013   Procedure: GIVENS CAPSULE STUDY;  Surgeon: Gatha Mayer, MD;  Location: [facility];  Service: Endoscopy;  Laterality: N/A;  . LEFT AND RIGHT HEART CATHETERIZATION WITH CORONARY ANGIOGRAM N/A 04/07/2013   Procedure: LEFT AND RIGHT HEART CATHETERIZATION WITH CORONARY ANGIOGRAM;  Surgeon: Larey Dresser, MD;  Location: [facility] CATH LAB;  Service: Cardiovascular;  Laterality: N/A;  . LEFT ATRIAL APPENDAGE OCCLUSION N/A 02/16/2016   Watchman not placed - nickel allergy  . LEFT HEART CATHETERIZATION WITH CORONARY/GRAFT ANGIOGRAM N/A 06/07/2014   Procedure: LEFT HEART CATHETERIZATION WITH Beatrix Fetters;  Surgeon: Burnell Blanks, MD;  Location[facility] CATH LAB;  Service: Cardiovascular;  Laterality: N/A;  . LIVER BIOPSY    . LOOP RECORDER IMPLANT N/A 12/10/2011   Procedure: LOOP RECORDER IMPLANT;  Surgeon: Evans Lance, MD;  Location: [facility] CATH LAB;  Service: Cardiovascular;  Laterality: N/A;  . MALONEY DILATION  06/23/2012   Procedure: [name] DILATION;  Surgeon: Gatha Mayer, MD;  Location: [facility] ENDOS[facility];  Service: Endoscopy;;  54 fr   . MASTECTOMY Left 05/17/2015   total   . NEPHRECTOMY  2008   Laparoscopic right nephrectomy, renal cell carcinoma  . PACEMAKER INSERTION  08/21/2012   Medtronic Adapta L dual-chamber pacemaker  . PERCUTANEOUS CORONARY STENT INTERVENTION (PCI-S) N/A 04/09/2013   Procedure: PERCUTANEOUS CORONARY STENT INTERVENTION (PCI-S);  Surgeon: Burnell Blanks, MD;  Location: [facility] CATH LAB;  Service: Cardiovascular;  Laterality: N/A;  . PERMANENT PACEMAKER INSERTION N/A 08/21/2012   Procedure: PERMANENT PACEMAKER INSERTION;  Surgeon: Evans Lance, MD;  Location: [facility] CATH LAB;  Service: Cardiovascular;  Laterality: N/A;  . RIGHT HEART CATH AND CORONARY/GRAFT ANGIOGRAPHY N/A 04/19/2017   Procedure: RIGHT HEART CATH AND CORONARY/GRAFT ANGIOGRAPHY;  Surgeon: Larey Dresser, MD;  Location: [place] CV LAB;  Service: Cardiovascular;  Laterality: N/A;  . TEE WITHOUT CARDIOVERSION N/A 12/07/2015   Procedure: TRANSESOPHAGEAL ECHOCARDIOGRAM (TEE);  Surgeon: Larey Dresser, MD;  Location: [place];  Service: Cardiovascular;  Laterality: N/A;  . TOTAL ABDOMINAL HYSTERECTOMY W/ BILATERAL SALPINGOOPHORECTOMY  2000   endometriosis and ovarian  cysts  . TOTAL MASTECTOMY Left 05/17/2015   Procedure: LEFT TOTAL MASTECTOMY;  Surgeon: Rolm Bookbinder, MD;  Location: [place];  Service: General;  Laterality: Left;  . TUMOR EXCISION  1981   removal of Hodgkins lymphoma    SOCIAL HISTORY: Social History   Socioeconomic History  . Marital status: Married    Spouse name: Not on file  . Number of children: 2  . Years of education: Not on file  . Highest education level: Not on file  Occupational History  . Occupation: retired    Comment: elementary principal  Social Needs  . Financial resource strain: Not on file  . Food insecurity:    Worry: Not on file    Inability: Not on file  . Transportation needs:    Medical: Not on file    Non-medical: Not on file  Tobacco Use  . Smoking status: Never Smoker   . Smokeless tobacco: Never Used  Substance and Sexual Activity  . Alcohol use: No    Alcohol/week: 0.0 oz  . Drug use: No  . Sexual activity: Yes  Lifestyle  . Physical activity:    Days per week: Not on file    Minutes per session: Not on file  . Stress: Not on file  Relationships  . Social connections:    Talks on phone: Not on file    Gets together: Not on file    Attends religious service: Not on file    Active member of club or organization: Not on file    Attends meetings of clubs or organizations: Not on file    Relationship status: Not on file  . Intimate partner violence:    Fear of current or ex partner: Not on file    Emotionally abused: Not on file    Physically abused: Not on file    Forced sexual activity: Not on file  Other Topics Concern  . Not on file  Social History Narrative   Retired [employer] principal, married    Started disability September 2013      Has one daughter in [place], works at [employer]. Has a son, Aaron Edelman, who is Retail buyer.   Eats all food groups.   Wear seatbelt.    Attends church. [place], [place]. [employer].     FAMILY HISTORY: Family History  Problem Relation Age of Onset  . Heart attack Mother        d.61  . Heart disease Mother   . Heart attack Father        d.60  . Diabetes Father   . Heart disease Father   . Lung cancer Sister        d.61 metastases to brain. History of smoking. Maternal half-sister.  . Cancer Sister 30       unspecified gynecologic cancer (either cervical or ovarian) in her 9s.  Marland Kitchen Heart attack Maternal Grandmother   . Cancer Maternal Grandfather 17       d.52s oral cancer  . Hypertension Brother   . Glaucoma Brother   . Breast cancer Sister 27       d.42 from metastatic disease. Paternal half-sister.  . Colon cancer Neg Hx   . Stomach cancer Neg Hx     ALLERGIES:  is allergic to avandia [rosiglitazone maleate]; cephalexin; clindamycin hcl; lincomycin; metformin; novolog [insulin aspart];  penicillins; tizanidine; adhesive [tape]; atorvastatin; cholestyramine; crestor [rosuvastatin]; gentamycin [gentamicin]; humalog [insulin lispro]; iodinated diagnostic agents; limonene; pravastatin; prednisone; simvastatin; strawberry extract; doxycycline; plavix [clopidogrel bisulfate];  codeine; erythromycin; erythromycin base; hydrocodone-acetaminophen; latex; neomycin; nickel; ranitidine; tamiflu [oseltamivir]; and tramadol.  MEDICATIONS:  Current Outpatient Medications  Medication Sig Dispense Refill  . Alirocumab (PRALUENT) 150 MG/ML SOPN Inject 1 pen into the skin every 14 (fourteen) days. 2 pen 11  . ALPRAZolam (XANAX) 0.5 MG tablet Take 0.5 mg by mouth at bedtime. May take additional dose daily for anxiety    . amLODipine (NORVASC) 5 MG tablet Take 0.5 tablets (2.5 mg total) by mouth daily. 45 tablet 3  . APIDRA 100 UNIT/ML injection Inject 46 Units into the skin as directed. INFUSE THROUGH INSULIN PUMP UTD::[place] 46.15 units daily, Bolus depends on carb intake  2  . aspirin EC 81 MG tablet Take 81 mg by mouth at bedtime.    Marland Kitchen azithromycin (ZITHROMAX) 500 MG tablet 1 tablet by mouth one hour prior to dental appointment/procedure 2 tablet 0  . Calcium Carbonate-Vitamin D (RA CALCIUM PLUS VITAMIN D) 600-400 MG-UNIT per tablet Take 1 tablet by mouth daily.     . Cholecalciferol 2000 UNITS TABS Take 1 tablet by mouth every morning.     . clobetasol (OLUX) 0.05 % topical foam Apply 1 application topically daily.  0  . Coenzyme Q10 (CO Q 10) 100 MG CAPS Take 1 capsule by mouth at bedtime.     . colchicine 0.6 MG tablet Take 0.6 mg by mouth at bedtime. And,Take 1 tablet at onset and repeate in 1 hour and then 1 daily x 2 (FOR FLARES)     . docusate sodium (COLACE) 100 MG capsule Take 100 mg by mouth at bedtime.    . Fe Fum-FA-B Cmp-C-Zn-Mg-Mn-Cu (HEMATINIC PLUS VIT/MINERALS) 106-1 MG TABS TAKE 1 TABLET BY MOUTH THREE TIMES A WEEK(MONDAY, WEDNESDAY, AND FRIDAY) OR AS DIRECTED WITH A VITAMIN "C" 90  tablet 0  . glucagon (GLUCAGON EMERGENCY) 1 MG injection Inject 1 mg into the vein once as needed (for severe reaction). Reported on 12/05/2015    . isosorbide mononitrate (IMDUR) 120 MG 24 hr tablet Take 120 mg by mouth daily.    Marland Kitchen latanoprost (XALATAN) 0.005 % ophthalmic solution Place 1 drop into both eyes at bedtime.    Marland Kitchen loratadine (CLARITIN) 10 MG tablet Take 1 tablet (10 mg total) by mouth daily. 30 tablet 11  . Magnesium Oxide 250 MG TABS Take 250 mg by mouth at bedtime.     . metolazone (ZAROXOLYN) 2.5 MG tablet Take 2.5 mg (1 tab) once as directed by CHF clinic. Call before taking [phone] 3 tablet 0  . metoprolol tartrate (LOPRESSOR) 50 MG tablet TAKE 1 AND 1/2 TABLETS(75 MG) BY MOUTH TWICE DAILY 270 tablet 2  . Multiple Vitamins-Minerals (MULTIVITAMIN WITH MINERALS) tablet Take 1 tablet by mouth every morning.     . nitroGLYCERIN (NITROLINGUAL) 0.4 MG/SPRAY spray Place 1 spray under the tongue every 5 (five) minutes as needed for chest pain. 12 g 1  . pantoprazole (PROTONIX) 40 MG tablet Take 1 tablet (40 mg total) by mouth daily. 30 tablet 11  . Polyethyl Glycol-Propyl Glycol (SYSTANE) 0.4-0.3 % SOLN Apply 1 drop to eye daily as needed (dry eyes).    . potassium chloride (MICRO-K) 10 MEQ CR capsule Take 4 capsules (40 mEq total) by mouth 2 (two) times daily. 240 capsule 3  . pravastatin (PRAVACHOL) 10 MG tablet TAKE 1 TABLET BY MOUTH ONCE DAILY 90 tablet 3  . ranolazine (RANEXA) 500 MG 12 hr tablet Take 1 tablet (500 mg total) by mouth 2 (two) times daily. 60 tablet  6  . spironolactone (ALDACTONE) 25 MG tablet Take 0.5 tablets (12.5 mg total) by mouth daily. 45 tablet 3  . terconazole (TERAZOL 7) 0.4 % vaginal cream Place 1 applicator vaginally at bedtime. 45 g 0  . ticagrelor (BRILINTA) 60 MG TABS tablet TAKE 1 TABLET(60 MG) BY MOUTH TWICE DAILY 180 tablet 2  . torsemide (DEMADEX) 20 MG tablet Take 4 tablets (80 mg total) by mouth 2 (two) times daily. 240 tablet 6  .  triamcinolone cream (KENALOG) 0.5 % Apply 1 application topically daily as needed (inflamation).     Marland Kitchen ULORIC 40 MG tablet Take 40 mg by mouth at bedtime.   [place] Med Name: So Clean CPAP cleaning system 1 each 0  . vitamin C (ASCORBIC ACID) 500 MG tablet Take 500 mg by mouth 3 (three) times a week.     No current facility-administered medications for this visit.     REVIEW OF SYSTEMS:   Constitutional: Denies fevers, chills or abnormal night sweats Eyes: Denies blurriness of vision, double vision or watery eyes Ears, nose, mouth, throat, and face: Denies mucositis or sore throat Respiratory: Denies cough, dyspnea or wheezes Cardiovascular: Denies palpitation, chest discomfort or lower extremity swelling Gastrointestinal:  Denies nausea, heartburn or change in bowel habits Skin: Denies abnormal skin rashes Lymphatics: Denies new lymphadenopathy or easy bruising Neurological:Denies numbness, tingling or new weaknesses Behavioral/Psych: Mood is stable, no new changes  All other systems were reviewed with the patient and are negative.  PHYSICAL EXAMINATION: ECOG PERFORMANCE STATUS: 1 - Symptomatic but completely ambulatory  Vitals:   10/10/17 1324  BP: (!) 139/58  Pulse: 76  Resp: 20  Temp: 98.1 F (36.7 C)  SpO2: 99%   Filed Weights   10/10/17 1324  Weight: 116 lb 1.6 oz (52.7 kg)    GENERAL:alert, no distress and comfortable SKIN: skin color, texture, turgor are normal, no rashes or significant lesions EYES: normal, conjunctiva are pink and non-injected, sclera clear OROPHARYNX:no exudate, no erythema and lips, buccal mucosa, and tongue normal  NECK: supple, thyroid normal size, non-tender, without nodularity LYMPH:  no palpable lymphadenopathy in the cervical, axillary or inguinal LUNGS: clear to auscultation and percussion with normal breathing effort HEART: regular rate & rhythm and no murmurs and no lower extremity edema ABDOMEN:abdomen soft, non-tender  and normal bowel sounds insulin pump in place. Musculoskeletal:no cyanosis of digits and no clubbing  PSYCH: alert & oriented x 3 with fluent speech NEURO: no focal motor/sensory deficits Breast exam: Left mastectomy site is within normal limits.  Right breast has no palpable masses.  LABORATORY DATA:  I have reviewed the data as listed Recent Results (from the past 2160 hour(s))  CBC with Differential     Status: Abnormal   Collection Time: 08/13/17 10:03 AM  Result Value Ref Range   WBC 8.1 3.8 - 10.8 Thousand/uL   RBC 4.23 3.80 - 5.10 Million/uL   Hemoglobin 12.8 11.7 - 15.5 g/dL   HCT 37.9 35.0 - 45.0 %   MCV 89.6 80.0 - 100.0 fL   MCH 30.3 27.0 - 33.0 pg   MCHC 33.8 32.0 - 36.0 g/dL   RDW 13.7 11.0 - 15.0 %   Platelets 108 (L) 140 - 400 Thousand/uL   MPV 11.7 7.5 - 12.5 fL   Neutro Abs 5,314 1,500 - 7,800 cells/uL   Lymphs Abs 1,798 850 - 3,900 cells/uL   WBC mixed population 697 200 - 950 cells/uL   Eosinophils Absolute 211 15 -  500 cells/uL   Basophils Absolute 81 0 - 200 cells/uL   Neutrophils Relative % 65.6 %   Total Lymphocyte 22.2 %   Monocytes Relative 8.6 %   Eosinophils Relative 2.6 %   Basophils Relative 1.0 %   Smear Review      Comment: Review of peripheral smear confirms automated results.   CUP PACEART REMOTE DEVICE CHECK     Status: None   Collection Time: 08/19/17  2:36 PM  Result Value Ref Range   Date Time Interrogation Session [id number]    Pulse Generator Manufacturer MERM    Pulse Gen Model ADDRL1 Adapta    Pulse Gen Serial Number [id number]    Clinic Name [facility]    Implantable Pulse Generator Type Implantable Pulse Generator    Implantable Pulse Generator Implant Date [id number]    Implantable Lead Manufacturer MERM    Implantable Lead Model 5076 CapSureFix Novus    Implantable Lead Serial Number [id number]    Implantable Lead Implant Date [id number]    Implantable Lead Location Detail 1 APEX    Implantable Lead Location [id number]     Implantable Lead Manufacturer MERM    Implantable Lead Model 5076 CapSureFix Novus    Implantable Lead Serial Number [id number]    Implantable Lead Implant Date [id number]    Implantable Lead Location Detail 1 APPENDAGE    Implantable Lead Location [phone]    Lead Channel Setting Sensing Sensitivity 5.60 mV   Lead Channel Setting Pacing Amplitude 1.750 V   Lead Channel Setting Pacing Pulse Width 0.40 ms   Lead Channel Setting Pacing Amplitude 2.500 V   Lead Channel Impedance Value 487 ohm   Lead Channel Sensing Intrinsic Amplitude 1.0 mV   Lead Channel Pacing Threshold Amplitude 0.875 V   Lead Channel Pacing Threshold Pulse Width 0.40 ms   Lead Channel Impedance Value 653 ohm   Lead Channel Sensing Intrinsic Amplitude 16.0 mV   Lead Channel Pacing Threshold Amplitude 0.375 V   Lead Channel Pacing Threshold Pulse Width 0.40 ms   Battery Status OK    Battery Remaining Longevity 142 mo   Battery Voltage 2.80 V   Battery Impedance 234 ohm   Brady Statistic AP VP Percent 0 %   Brady Statistic AS VP Percent 0 %   Brady Statistic AP VS Percent 0 %   Brady Statistic AS VS Percent 100 %   Eval Rhythm AsVs   CBC with Differential     Status: Abnormal   Collection Time: 08/23/17  2:52 PM  Result Value Ref Range   WBC 7.0 3.8 - 10.8 Thousand/uL   RBC 4.21 3.80 - 5.10 Million/uL   Hemoglobin 12.9 11.7 - 15.5 g/dL   HCT 37.8 35.0 - 45.0 %   MCV 89.8 80.0 - 100.0 fL   MCH 30.6 27.0 - 33.0 pg   MCHC 34.1 32.0 - 36.0 g/dL   RDW 13.7 11.0 - 15.0 %   Platelets 113 (L) 140 - 400 Thousand/uL   MPV 12.1 7.5 - 12.5 fL   Neutro Abs 4,550 1,500 - 7,800 cells/uL   Lymphs Abs 1,596 850 - 3,900 cells/uL   WBC mixed population 595 200 - 950 cells/uL   Eosinophils Absolute 182 15 - 500 cells/uL   Basophils Absolute 77 0 - 200 cells/uL   Neutrophils Relative % 65 %   Total Lymphocyte 22.8 %   Monocytes Relative 8.5 %   Eosinophils Relative 2.6 %   Basophils Relative 1.1 %   Smear Review  Comment: Review of peripheral smear confirms automated results.   Cytology - PAP     Status: None   Collection Time: 09/05/17 12:00 AM  Result Value Ref Range   Adequacy Satisfactory for evaluation.    Diagnosis      NEGATIVE FOR INTRAEPITHELIAL LESIONS OR MALIGNANCY.   HPV NOT DETECTED     Comment: Normal Reference Range - NOT Detected   Material Submitted Vaginal Pap [ThinPrep Imaged]    CYTOLOGY - PAP PAP RESULT   HIV antibody (with reflex)     Status: None   Collection Time: 09/05/17 11:40 AM  Result Value Ref Range   HIV 1&2 Ab, 4th Generation NON-REACTIVE NON-REACTI    Comment: HIV-1 antigen and HIV-1/HIV-2 antibodies were not detected. There is no laboratory evidence of HIV infection. Marland Kitchen PLEASE NOTE: This information has been disclosed to you from records whose confidentiality may be protected by state law.  If your state requires such protection, then the state law prohibits you from making any further disclosure of the information without the specific written consent of the person to whom it pertains, or as otherwise permitted by law. A general authorization for the release of medical or other information is NOT sufficient for this purpose. . For additional information please refer to http://education.questdiagnostics.com/faq/FAQ106 (This link is being provided for informational/ educational purposes only.) . Marland Kitchen The performance of this assay has not been clinically validated in patients less than 82 years old. Marland Kitchen   CBC with Differential/Platelet     Status: Abnormal   Collection Time: 09/05/17 11:40 AM  Result Value Ref Range   WBC 7.1 3.8 - 10.8 Thousand/uL   RBC 4.29 3.80 - 5.10 Million/uL   Hemoglobin 13.2 11.7 - 15.5 g/dL   HCT 38.7 35.0 - 45.0 %   MCV 90.2 80.0 - 100.0 fL   MCH 30.8 27.0 - 33.0 pg   MCHC 34.1 32.0 - 36.0 g/dL   RDW 13.6 11.0 - 15.0 %   Platelets 116 (L) 140 - 400 Thousand/uL   MPV 11.8 7.5 - 12.5 fL   Neutro Abs 4,785 1,500 - 7,800  cells/uL   Lymphs Abs 1,491 850 - 3,900 cells/uL   WBC mixed population 604 200 - 950 cells/uL   Eosinophils Absolute 142 15 - 500 cells/uL   Basophils Absolute 78 0 - 200 cells/uL   Neutrophils Relative % 67.4 %   Total Lymphocyte 21.0 %   Monocytes Relative 8.5 %   Eosinophils Relative 2.0 %   Basophils Relative 1.1 %   Smear Review      Comment: No platelet clumps seen. Review of peripheral smear confirms automated results.   CBC with Differential/Platelet     Status: Abnormal   Collection Time: 10/10/17  2:52 PM  Result Value Ref Range   WBC 8.2 4.0 - 10.5 K/uL   RBC 4.45 3.87 - 5.11 MIL/uL   Hemoglobin 13.5 12.0 - 15.0 g/dL   HCT 41.4 36.0 - 46.0 %   MCV 93.0 78.0 - 100.0 fL   MCH 30.3 26.0 - 34.0 pg   MCHC 32.6 30.0 - 36.0 g/dL   RDW 13.5 11.5 - 15.5 %   Platelets 128 (L) 150 - 400 K/uL   Neutrophils Relative % 67 %   Neutro Abs 5.5 1.7 - 7.7 K/uL   Lymphocytes Relative 21 %   Lymphs Abs 1.7 0.7 - 4.0 K/uL   Monocytes Relative 9 %   Monocytes Absolute 0.7 0.1 - 1.0 K/uL   Eosinophils  Relative 2 %   Eosinophils Absolute 0.2 0.0 - 0.7 K/uL   Basophils Relative 1 %   Basophils Absolute 0.0 0.0 - 0.1 K/uL    Comment: Performed at [facility], [address] [place], [place] [address]  Lactate dehydrogenase     Status: None   Collection Time: 10/10/17  2:52 PM  Result Value Ref Range   LDH 166 98 - 192 U/L    Comment: Performed at [facility], [address], [place], [place] [address]  Technologist smear review     Status: None   Collection Time: 10/10/17  2:53 PM  Result Value Ref Range   Tech Review FEW LARGE PLATELETS SEEN     Comment: Performed at [facility], [address], [place], [place] [address]    RADIOGRAPHIC STUDIES: I have reviewed her prior CT scan from March 2016 which showed normal-sized.  ASSESSMENT & PLAN:  1. Thrombocytopenia: She has mild thrombocytopenia, with platelet count mostly around 110-120.  She has occasional nosebleeds and easy  bruising.  She is also on Brilinta at this time.  Differential diagnosis includes thrombocytopenia hypersplenism/splenomegaly from her underlying NASH.  Other possibility of immune mediated thrombocytopenia.  I will repeat a platelet count today, review her smear.  We will also send for connective-tissue disorder testing.  I have recommended doing a CT scan of her abdomen to evaluate for splenomegaly.  She is having serial ultrasounds of her liver every 6 months with her hepatologist at [facility].  2.  Left breast DCIS: Physical examination was within normal limits.  Right breast has no palpable masses.  She underwent left mastectomy in 2016.  She was not placed on tamoxifen/therapy.  3.  Stage IIb Hodgkin's disease: She received mantle radiation in 1981.  She is in remission at this time.  4.  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   All questions were answered. The patient knows to call the clinic with any problems, questions or concerns.      Derek Jack, MD 10/10/17 4:24 PM

## 2017-10-10 NOTE — Patient Instructions (Signed)
[place] at [facility] Discharge Instructions  Today you saw Dr. Raliegh Ip.    Thank you for choosing [place] at [facility] to provide your oncology and hematology care.  To afford each patient quality time with our provider, please arrive at least 15 minutes before your scheduled appointment time.   If you have a lab appointment with the [facility] please come in thru the  Main Entrance and check in at the main information desk  You need to re-schedule your appointment should you arrive 10 or more minutes late.  We strive to give you quality time with our providers, and arriving late affects you and other patients whose appointments are after yours.  Also, if you no show three or more times for appointments you may be dismissed from the clinic at the providers discretion.     Again, thank you for choosing [facility].  Our hope is that these requests will decrease the amount of time that you wait before being seen by our physicians.       _____________________________________________________________  Should you have questions after your visit to [facility], please contact our office at [phone] between the hours of 8:30 a.m. and 4:30 p.m.  Voicemails left after 4:30 p.m. will not be returned until the following business day.  For prescription refill requests, have your pharmacy contact our office.       Resources For Cancer Patients and their Caregivers ? American Cancer Society: Can assist with transportation, wigs, general needs, runs Look Good Feel Better.        216 884 2855 ? Cancer Care: Provides financial assistance, online support groups, medication/co-pay assistance.  1-800-813-HOPE (505)465-4113) ? [place] Assists [place] cancer patients and their families through emotional , educational and financial support.  (334)041-6586 ? [place] DSS Where to apply for food  stamps, Medicaid and utility assistance. (337)728-8824 ? [employer]: Transportation to medical appointments. 2391947878 ? Social Security Administration: May apply for disability if have a Stage IV cancer. 224-369-1518 (731) 262-7456 ? [employer], Disability and Transit Services: Assists with nutrition, care and transit needs. [facility] Support Programs:   > Cancer Support Group  2nd Tuesday of the month 1pm-2pm, Journey Room   > Creative Journey  3rd Tuesday of the month 1130am-1pm, Journey Room

## 2017-10-10 NOTE — Telephone Encounter (Signed)
Done

## 2017-10-11 ENCOUNTER — Other Ambulatory Visit (HOSPITAL_COMMUNITY): Payer: Self-pay

## 2017-10-11 DIAGNOSIS — D649 Anemia, unspecified: Secondary | ICD-10-CM

## 2017-10-11 LAB — RHEUMATOID FACTOR

## 2017-10-11 LAB — ANTINUCLEAR ANTIBODIES, IFA: ANTINUCLEAR ANTIBODIES, IFA: NEGATIVE

## 2017-10-14 ENCOUNTER — Ambulatory Visit (HOSPITAL_COMMUNITY)
Admission: RE | Admit: 2017-10-14 | Discharge: 2017-10-14 | Disposition: A | Discharge status: Home / Self Care | Payer: Medicare Other | Source: Ambulatory Visit | Attending: Hematology | Admitting: Hematology

## 2017-10-14 ENCOUNTER — Inpatient Hospital Stay (HOSPITAL_COMMUNITY): Payer: Medicare Other

## 2017-10-14 ENCOUNTER — Telehealth (HOSPITAL_COMMUNITY): Payer: Self-pay | Admitting: *Deleted

## 2017-10-14 DIAGNOSIS — I700 Atherosclerosis of aorta: Secondary | ICD-10-CM | POA: Insufficient documentation

## 2017-10-14 DIAGNOSIS — K8020 Calculus of gallbladder without cholecystitis without obstruction: Secondary | ICD-10-CM | POA: Diagnosis not present

## 2017-10-14 DIAGNOSIS — D696 Thrombocytopenia, unspecified: Secondary | ICD-10-CM | POA: Diagnosis not present

## 2017-10-14 DIAGNOSIS — D649 Anemia, unspecified: Secondary | ICD-10-CM

## 2017-10-14 NOTE — Telephone Encounter (Signed)
Patient called to report she stopped Ranexa Saturday due to nausea, dizziness, blood blisters on tongue, low blood sugar, tingling in right hand, and a number of other side effects.   Message routed to [place]

## 2017-10-14 NOTE — Telephone Encounter (Signed)
Ok, stay off.  

## 2017-10-15 ENCOUNTER — Other Ambulatory Visit (HOSPITAL_COMMUNITY): Payer: Self-pay | Admitting: *Deleted

## 2017-10-15 LAB — LUPUS ANTICOAGULANT PANEL
DRVVT: 35.7 s (ref 0.0–47.0)
PTT Lupus Anticoagulant: 36.9 s (ref 0.0–51.9)

## 2017-10-15 NOTE — Telephone Encounter (Signed)
Pt aware.

## 2017-10-18 ENCOUNTER — Other Ambulatory Visit (HOSPITAL_COMMUNITY): Payer: Medicare Other

## 2017-10-18 ENCOUNTER — Ambulatory Visit (HOSPITAL_COMMUNITY)
Admission: RE | Admit: 2017-10-18 | Discharge: 2017-10-18 | Disposition: A | Discharge status: Home / Self Care | Payer: Medicare Other | Source: Ambulatory Visit | Attending: Cardiology | Admitting: Cardiology

## 2017-10-18 ENCOUNTER — Telehealth (HOSPITAL_COMMUNITY): Payer: Self-pay | Admitting: *Deleted

## 2017-10-18 DIAGNOSIS — I5022 Chronic systolic (congestive) heart failure: Secondary | ICD-10-CM

## 2017-10-18 DIAGNOSIS — I5032 Chronic diastolic (congestive) heart failure: Secondary | ICD-10-CM | POA: Insufficient documentation

## 2017-10-18 LAB — BASIC METABOLIC PANEL
ANION GAP: 14 (ref 5–15)
BUN: 89 mg/dL — AB (ref 6–20)
CHLORIDE: 95 mmol/L — AB (ref 101–111)
CO2: 25 mmol/L (ref 22–32)
Calcium: 10.1 mg/dL (ref 8.9–10.3)
Creatinine, Ser: 2.09 mg/dL — ABNORMAL HIGH (ref 0.44–1.00)
GFR calc Af Amer: 28 mL/min — ABNORMAL LOW (ref >60)
GFR, EST NON AFRICAN AMERICAN: 24 mL/min — AB (ref >60)
Glucose, Bld: 259 mg/dL — ABNORMAL HIGH (ref 65–99)
POTASSIUM: 4.2 mmol/L (ref 3.5–5.1)
SODIUM: 134 mmol/L — AB (ref 135–145)

## 2017-10-18 MED ORDER — TORSEMIDE 20 MG PO TABS: ORAL_TABLET | ORAL | 6 refills | Status: DC

## 2017-10-18 NOTE — Telephone Encounter (Signed)
-----   Message from Larey Dresser, MD sent at 10/18/2017 12:50 PM EDT ----- Elevated creatinine.  Do not take any more metolazone.  Hold torsemide x 2 days then decrease back to 80 qam/60 qpm.  Will need BMET 1 week. Call today.

## 2017-10-19 ENCOUNTER — Telehealth: Payer: Self-pay | Admitting: Cardiology

## 2017-10-19 NOTE — Telephone Encounter (Signed)
Pt called in to ask if she should hold her KDur while her torsemide is on hold for 2 days. I advised her that she should hold it while torsemide on hold and restart with the torsemide. She is concerned about her kidney function with elevated BUN and Creat. She had 5 pound wt loss with the increased diuretics. I advised her to get adequate oral fluid intake of at least 6 cups of water, even 8 cups for today minimum to flush her kidneys. She has repeat labs ordered for this week. She is monitoring her wts.

## 2017-10-21 ENCOUNTER — Inpatient Hospital Stay (HOSPITAL_COMMUNITY): Payer: Medicare Other

## 2017-10-21 ENCOUNTER — Other Ambulatory Visit: Payer: Self-pay

## 2017-10-21 ENCOUNTER — Ambulatory Visit: Payer: Medicare Other

## 2017-10-21 ENCOUNTER — Inpatient Hospital Stay (HOSPITAL_COMMUNITY): Payer: Medicare Other | Attending: Hematology | Admitting: Hematology

## 2017-10-21 ENCOUNTER — Ambulatory Visit (HOSPITAL_COMMUNITY): Payer: Medicare Other | Attending: Cardiology

## 2017-10-21 ENCOUNTER — Other Ambulatory Visit: Payer: Self-pay | Admitting: Cardiology

## 2017-10-21 ENCOUNTER — Encounter (HOSPITAL_COMMUNITY): Payer: Self-pay | Admitting: Hematology

## 2017-10-21 DIAGNOSIS — R079 Chest pain, unspecified: Secondary | ICD-10-CM

## 2017-10-21 DIAGNOSIS — Z86000 Personal history of in-situ neoplasm of breast: Secondary | ICD-10-CM | POA: Diagnosis not present

## 2017-10-21 DIAGNOSIS — Z79899 Other long term (current) drug therapy: Secondary | ICD-10-CM | POA: Diagnosis not present

## 2017-10-21 DIAGNOSIS — I25119 Atherosclerotic heart disease of native coronary artery with unspecified angina pectoris: Secondary | ICD-10-CM

## 2017-10-21 DIAGNOSIS — Z923 Personal history of irradiation: Secondary | ICD-10-CM | POA: Insufficient documentation

## 2017-10-21 DIAGNOSIS — Z85528 Personal history of other malignant neoplasm of kidney: Secondary | ICD-10-CM | POA: Diagnosis not present

## 2017-10-21 DIAGNOSIS — R600 Localized edema: Secondary | ICD-10-CM | POA: Insufficient documentation

## 2017-10-21 DIAGNOSIS — Z7982 Long term (current) use of aspirin: Secondary | ICD-10-CM | POA: Diagnosis not present

## 2017-10-21 DIAGNOSIS — D696 Thrombocytopenia, unspecified: Secondary | ICD-10-CM | POA: Insufficient documentation

## 2017-10-21 DIAGNOSIS — N182 Chronic kidney disease, stage 2 (mild): Secondary | ICD-10-CM

## 2017-10-21 DIAGNOSIS — Z905 Acquired absence of kidney: Secondary | ICD-10-CM

## 2017-10-21 DIAGNOSIS — Z9012 Acquired absence of left breast and nipple: Secondary | ICD-10-CM | POA: Insufficient documentation

## 2017-10-21 DIAGNOSIS — Z8571 Personal history of Hodgkin lymphoma: Secondary | ICD-10-CM

## 2017-10-21 LAB — MYOCARDIAL PERFUSION IMAGING
CHL CUP NUCLEAR SDS: 2
CHL CUP RESTING HR STRESS: 80 {beats}/min
LVDIAVOL: 45 mL (ref 46–106)
LVSYSVOL: 6 mL
Peak HR: 110 {beats}/min
RATE: 0.32
SRS: 23
SSS: 25
TID: 0.91

## 2017-10-21 LAB — BASIC METABOLIC PANEL
ANION GAP: 12 (ref 5–15)
BUN: 34 mg/dL — ABNORMAL HIGH (ref 6–20)
CO2: 27 mmol/L (ref 22–32)
CREATININE: 1.11 mg/dL — AB (ref 0.44–1.00)
Calcium: 9.8 mg/dL (ref 8.9–10.3)
Chloride: 100 mmol/L — ABNORMAL LOW (ref 101–111)
GFR calc non Af Amer: 51 mL/min — ABNORMAL LOW (ref >60)
GFR, EST AFRICAN AMERICAN: 59 mL/min — AB (ref >60)
Glucose, Bld: 282 mg/dL — ABNORMAL HIGH (ref 65–99)
Potassium: 4 mmol/L (ref 3.5–5.1)
SODIUM: 139 mmol/L (ref 135–145)

## 2017-10-21 MED ORDER — TECHNETIUM TC 99M TETROFOSMIN IV KIT
10.3000 | PACK | Freq: Once | INTRAVENOUS | Status: AC | PRN
  Administered 2017-10-21: 10.3 via INTRAVENOUS
  Filled 2017-10-21: qty 11

## 2017-10-21 MED ORDER — REGADENOSON 0.4 MG/5ML IV SOLN
0.4000 mg | Freq: Once | INTRAVENOUS | Status: AC
  Administered 2017-10-21: 0.4 mg via INTRAVENOUS

## 2017-10-21 MED ORDER — TECHNETIUM TC 99M TETROFOSMIN IV KIT
30.7000 | PACK | Freq: Once | INTRAVENOUS | Status: AC | PRN
  Administered 2017-10-21: 30.7 via INTRAVENOUS
  Filled 2017-10-21: qty 31

## 2017-10-21 NOTE — Patient Instructions (Signed)
Exam and discussion today with Dr. Delton Coombes. Return as scheduled for lab work and office visit. Please call the clinic should you have any questions or concerns prior to your next visit.

## 2017-10-21 NOTE — Progress Notes (Signed)
Patient Care Team: [name], MD as PCP - General (Family Medicine) Rothbart, Cristopher Estimable, MD (Cardiology) Gatha Mayer, MD (Gastroenterology) Altheimer, Legrand Como, MD (Endocrinology) Raynelle Bring, MD (Urology) [name], MD (Hematology and Oncology) [name], MD as Referring Physician (Endocrinology)  DIAGNOSIS:  Encounter Diagnoses  Name Primary?  Marland Kitchen Hx of radiation therapy Yes  . CKD (chronic kidney disease), stage II   . Thrombocytopenia (HCC)      CHIEF COMPLIANT: Follow-up of thrombocytopenia.  INTERVAL HISTORY: [name] [name] is here for follow-up of workup for thrombocytopenia.  She was evaluated by me 2 weeks ago for mild thrombocytopenia.  I have ordered some blood work and CT scan.  She was reportedly evaluated by her cardiologist last week and was noted to have elevated creatinine.  She was told to be off of torsemide over the weekend.  She takes torsemide 80 mg twice daily.  She missed a total of 5 doses.  She took today's dose at noontime.  She gained about 5 pounds as she held the diuretic.  She is very concerned about her renal function.  She denies any major bleeding episodes.  REVIEW OF SYSTEMS:   Constitutional: Denies fevers, chills or abnormal weight loss Eyes: Denies blurriness of vision Ears, nose, mouth, throat, and face: Denies mucositis or sore throat Respiratory: Denies cough, dyspnea or wheezes Cardiovascular: Denies palpitation, chest discomfort Gastrointestinal:  Denies nausea, heartburn or change in bowel habits Skin: Denies abnormal skin rashes Lymphatics: Denies new lymphadenopathy or easy bruising Neurological:Denies numbness, tingling or new weaknesses Behavioral/Psych: Mood is stable, no new changes  Extremities: 1+ edema in the lower extremities.  Breast:  denies any pain or lumps or nodules in either breasts All other systems were reviewed with the patient and are negative.  I have reviewed the past medical history,  past surgical history, social history and family history with the patient and they are unchanged from previous note.  ALLERGIES:  is allergic to avandia [rosiglitazone maleate]; cephalexin; clindamycin hcl; lincomycin; metformin; novolog [insulin aspart]; penicillins; tizanidine; adhesive [tape]; atorvastatin; cholestyramine; crestor [rosuvastatin]; gentamycin [gentamicin]; humalog [insulin lispro]; iodinated diagnostic agents; limonene; pravastatin; prednisone; ranexa [ranolazine]; simvastatin; strawberry extract; doxycycline; plavix [clopidogrel bisulfate]; codeine; erythromycin; erythromycin base; hydrocodone-acetaminophen; latex; neomycin; nickel; ranitidine; tamiflu [oseltamivir]; and tramadol.  MEDICATIONS:  Current Outpatient Medications  Medication Sig Dispense Refill  . Alirocumab (PRALUENT) 150 MG/ML SOPN Inject 1 pen into the skin every 14 (fourteen) days. 2 pen 11  . ALPRAZolam (XANAX) 0.5 MG tablet Take 0.5 mg by mouth at bedtime. May take additional dose daily for anxiety    . amLODipine (NORVASC) 5 MG tablet Take 0.5 tablets (2.5 mg total) by mouth daily. 45 tablet 3  . APIDRA 100 UNIT/ML injection Inject 46 Units into the skin as directed. INFUSE THROUGH INSULIN PUMP UTD::[place] 46.15 units daily, [place]us depends on carb intake  2  . aspirin EC 81 MG tablet Take 81 mg by mouth at bedtime.    Marland Kitchen azithromycin (ZITHROMAX) 500 MG tablet 1 tablet by mouth one hour prior to dental appointment/procedure 2 tablet 0  . Calcium Carbonate-Vitamin D (RA CALCIUM PLUS VITAMIN D) 600-400 MG-UNIT per tablet Take 1 tablet by mouth daily.     . Cholecalciferol 2000 UNITS TABS Take 1 tablet by mouth every morning.     . clobetasol (OLUX) 0.05 % topical foam Apply 1 application topically daily.  0  . Coenzyme Q10 (CO Q 10) 100 MG CAPS Take 1 capsule by mouth at bedtime.     Marland Kitchen  colchicine 0.6 MG tablet Take 0.6 mg by mouth at bedtime. And,Take 1 tablet at onset and repeate in 1 hour and then 1 daily x 2 (FOR  FLARES)     . docusate sodium (COLACE) 100 MG capsule Take 100 mg by mouth at bedtime.    . Fe Fum-FA-B Cmp-C-Zn-Mg-Mn-Cu (HEMATINIC PLUS VIT/MINERALS) 106-1 MG TABS TAKE 1 TABLET BY MOUTH THREE TIMES A WEEK(MONDAY, WEDNESDAY, AND FRIDAY) OR AS DIRECTED WITH A VITAMIN "C" 90 tablet 0  . glucagon (GLUCAGON EMERGENCY) 1 MG injection Inject 1 mg into the vein once as needed (for severe reaction). Reported on 12/05/2015    . isosorbide mononitrate (IMDUR) 120 MG 24 hr tablet Take 120 mg by mouth daily.    Marland Kitchen latanoprost (XALATAN) 0.005 % ophthalmic solution Place 1 drop into both eyes at bedtime.    Marland Kitchen loratadine (CLARITIN) 10 MG tablet Take 1 tablet (10 mg total) by mouth daily. 30 tablet 11  . Magnesium Oxide 250 MG TABS Take 250 mg by mouth at bedtime.     . metolazone (ZAROXOLYN) 2.5 MG tablet Take 2.5 mg (1 tab) once as directed by CHF clinic. Call before taking [phone] 3 tablet 0  . metoprolol tartrate (LOPRESSOR) 50 MG tablet TAKE 1 AND 1/2 TABLETS(75 MG) BY MOUTH TWICE DAILY 270 tablet 2  . Multiple Vitamins-Minerals (MULTIVITAMIN WITH MINERALS) tablet Take 1 tablet by mouth every morning.     . nitroGLYCERIN (NITROLINGUAL) 0.4 MG/SPRAY spray Place 1 spray under the tongue every 5 (five) minutes as needed for chest pain. 12 g 1  . pantoprazole (PROTONIX) 40 MG tablet Take 1 tablet (40 mg total) by mouth daily. 30 tablet 11  . Polyethyl Glycol-Propyl Glycol (SYSTANE) 0.4-0.3 % SOLN Apply 1 drop to eye daily as needed (dry eyes).    . potassium chloride (MICRO-K) 10 MEQ CR capsule Take 4 capsules (40 mEq total) by mouth 2 (two) times daily. 240 capsule 3  . pravastatin (PRAVACHOL) 10 MG tablet TAKE 1 TABLET BY MOUTH ONCE DAILY 90 tablet 3  . spironolactone (ALDACTONE) 25 MG tablet Take 0.5 tablets (12.5 mg total) by mouth daily. 45 tablet 3  . terconazole (TERAZOL 7) 0.4 % vaginal cream Place 1 applicator vaginally at bedtime. 45 g 0  . ticagrelor (BRILINTA) 60 MG TABS tablet TAKE 1 TABLET(60  MG) BY MOUTH TWICE DAILY 180 tablet 2  . torsemide (DEMADEX) 20 MG tablet Take 40m in the AM and 684min the PM daily. 240 tablet 6  . triamcinolone cream (KENALOG) 0.5 % Apply 1 application topically daily as needed (inflamation).     . Marland KitchenLORIC 40 MG tablet Take 40 mg by mouth at bedtime.   11Golfed Name: So Clean CPAP cleaning system 1 each 0  . vitamin C (ASCORBIC ACID) 500 MG tablet Take 500 mg by mouth 3 (three) times a week.     No current facility-administered medications for this visit.     PHYSICAL EXAMINATION: ECOG PERFORMANCE STATUS: 1 - Symptomatic but completely ambulatory  GENERAL:alert, no distress and comfortable SKIN: skin color, texture, turgor are normal, no rashes or significant lesions EYES: normal, Conjunctiva are pink and non-injected, sclera clear OROPHARYNX:no mucositis, no erythema and lips, buccal mucosa, and tongue normal  NECK: supple, thyroid normal size, non-tender, without nodularity LUNGS: clear to auscultation and percussion with normal breathing effort HEART: regular rate & rhythm and no murmurs and no lower extremity edema ABDOMEN:abdomen soft, non-tender and normal bowel sounds  EXTREMITIES: 1+ edema bilaterally. BREAST: Exam done at last visit showed no masses in the right breast, left mastectomy was within normal limits.  LABORATORY DATA:  I have reviewed the data as listed CMP Latest Ref Rng & Units 10/18/2017 07/10/2017 05/27/2017  Glucose 65 - 99 mg/dL 259(H) 318(H) 169(H)  BUN 6 - 20 mg/dL 89(H) 42(H) 35(H)  Creatinine 0.44 - 1.00 mg/dL 2.09(H) 1.51(H) 1.46(H)  Sodium 135 - 145 mmol/L 134(L) 135 137  Potassium 3.5 - 5.1 mmol/L 4.2 4.3 4.1  Chloride 101 - 111 mmol/L 95(L) 97(L) 99(L)  CO2 22 - 32 mmol/L 25 29 29   Calcium 8.9 - 10.3 mg/dL 10.1 9.7 10.0  Total Protein 6.5 - 8.1 g/dL - - -  Total Bilirubin 0.3 - 1.2 mg/dL - - -  Alkaline Phos 38 - 126 U/L - - -  AST 15 - 41 U/L - - -  ALT 14 - 54 U/L - - -   No results  found for: [id number]   Lab Results  Component Value Date   WBC 8.2 10/10/2017   HGB 13.5 10/10/2017   HCT 41.4 10/10/2017   MCV 93.0 10/10/2017   PLT 128 (L) 10/10/2017   NEUTROABS 5.5 10/10/2017    ASSESSMENT & PLAN:  Thrombocytopenia ([place]) 1.  Mild thrombocytopenia: Her repeat platelet count was 128.  Peripheral smear shows giant platelets.  CT scan of the abdomen did not show splenomegaly.  Hence the likely cause of mild thrombocytopenia is immune mediated destruction.  No active intervention necessary at this time.  I will see her back in 4 months and repeat her CBC.  I have discussed the results of connective tissue disorder workup including negative ANA, rheumatoid factor, and lupus anticoagulant.  2.  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  Right breast has no palpable masses.  Left mastectomy was done in 2016.  She was not placed on tamoxifen therapy.  3   Stage IIb Hodgkin's disease: She received mantle radiation in 1981. She continues to be in remission since then.  4.  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  I have discussed the findings on the CT scan which showed left kidney posterior cortex lesion which is slightly increased to 1.4 cm from 1.1 cm.        Orders Placed This Encounter  Procedures  . Basic metabolic panel    Standing Status:   Future    Standing Expiration Date:   10/21/2018   The patient has a good understanding of the overall plan. she agrees with it. she will call with any problems that may develop before the next visit here.   Derek Jack, MD 10/21/17

## 2017-10-21 NOTE — Assessment & Plan Note (Signed)
1.  Mild thrombocytopenia: Her repeat platelet count was 128.  Peripheral smear shows giant platelets.  CT scan of the abdomen did not show splenomegaly.  Hence the likely cause of mild thrombocytopenia is immune mediated destruction.  No active intervention necessary at this time.  I will see her back in 4 months and repeat her CBC.  I have discussed the results of connective tissue disorder workup including negative ANA, rheumatoid factor, and lupus anticoagulant.  2.  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  Right breast has no palpable masses.  Left mastectomy was done in 2016.  She was not placed on tamoxifen therapy.  3   Stage IIb Hodgkin's disease: She received mantle radiation in 1981. She continues to be in remission since then.  4.  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  I have discussed the findings on the CT scan which showed left kidney posterior cortex lesion which is slightly increased to 1.4 cm from 1.1 cm.

## 2017-10-22 ENCOUNTER — Other Ambulatory Visit: Payer: Self-pay | Admitting: Cardiology

## 2017-10-22 NOTE — Telephone Encounter (Signed)
Patient called back to verify that her creatine was down and she was safe to restart her torsemide at 80 am and 60 pm.  Verified with Dr. Aundra Dubin and he advises her to go ahead and restart it and will check labs this Thursday.    Results of normal stress test also provided to patient.  No further questions.

## 2017-10-24 ENCOUNTER — Ambulatory Visit (HOSPITAL_COMMUNITY)
Admission: RE | Admit: 2017-10-24 | Discharge: 2017-10-24 | Disposition: A | Discharge status: Home / Self Care | Payer: Medicare Other | Source: Ambulatory Visit | Attending: Cardiology | Admitting: Cardiology

## 2017-10-24 DIAGNOSIS — I5022 Chronic systolic (congestive) heart failure: Secondary | ICD-10-CM | POA: Insufficient documentation

## 2017-10-24 LAB — BASIC METABOLIC PANEL
ANION GAP: 13 (ref 5–15)
BUN: 46 mg/dL — ABNORMAL HIGH (ref 6–20)
CHLORIDE: 100 mmol/L — AB (ref 101–111)
CO2: 28 mmol/L (ref 22–32)
Calcium: 9.7 mg/dL (ref 8.9–10.3)
Creatinine, Ser: 1.52 mg/dL — ABNORMAL HIGH (ref 0.44–1.00)
GFR calc Af Amer: 41 mL/min — ABNORMAL LOW (ref >60)
GFR calc non Af Amer: 35 mL/min — ABNORMAL LOW (ref >60)
Glucose, Bld: 206 mg/dL — ABNORMAL HIGH (ref 65–99)
POTASSIUM: 3.9 mmol/L (ref 3.5–5.1)
SODIUM: 141 mmol/L (ref 135–145)

## 2017-10-25 ENCOUNTER — Telehealth (HOSPITAL_COMMUNITY): Payer: Self-pay

## 2017-10-25 NOTE — Telephone Encounter (Signed)
Advanced Heart Failure Triage Encounter  Patient Name: [name] [name]  Date of Call: 10/25/17  Problem:  Pt called b/c she had severe leg cramps last night. This has happened before on metolazone. Pt only is taking torsemide at prescribe dose of 80 am/60 pm and potassium 73mq bid. Labs yesterday K+ level 3.9. Pt may need extra dose of potassium [facility]   Plan:    KShirley Muscat RN

## 2017-10-25 NOTE — Telephone Encounter (Signed)
Pt aware and agreeable to plan with no further questions at this time

## 2017-10-25 NOTE — Telephone Encounter (Signed)
Patient has chronic leg cramps, usually out of proportion to electrolytes and recorded in chart as far back as 2012.    She can take 20 meq of potassium on metolazone days if she is not already.  Would not take more at this time, and would not change if she is already taking extra on metolazone days.   Would recommend heating pads if no adjustment needed on K.    Legrand Como [address] [place], PA-C 10/25/2017 10:24 AM

## 2017-10-28 ENCOUNTER — Ambulatory Visit (HOSPITAL_COMMUNITY): Payer: Medicare Other | Admitting: Hematology

## 2017-11-01 ENCOUNTER — Encounter (HOSPITAL_COMMUNITY): Payer: Self-pay | Admitting: Cardiology

## 2017-11-01 ENCOUNTER — Ambulatory Visit (HOSPITAL_COMMUNITY)
Admission: RE | Admit: 2017-11-01 | Discharge: 2017-11-01 | Disposition: A | Discharge status: Home / Self Care | Payer: Medicare Other | Source: Ambulatory Visit | Attending: Cardiology | Admitting: Cardiology

## 2017-11-01 VITALS — HR 78 | Wt 120.0 lb

## 2017-11-01 DIAGNOSIS — Z8673 Personal history of transient ischemic attack (TIA), and cerebral infarction without residual deficits: Secondary | ICD-10-CM | POA: Diagnosis not present

## 2017-11-01 DIAGNOSIS — Q231 Congenital insufficiency of aortic valve: Secondary | ICD-10-CM | POA: Insufficient documentation

## 2017-11-01 DIAGNOSIS — I25110 Atherosclerotic heart disease of native coronary artery with unstable angina pectoris: Secondary | ICD-10-CM

## 2017-11-01 DIAGNOSIS — Z853 Personal history of malignant neoplasm of breast: Secondary | ICD-10-CM | POA: Insufficient documentation

## 2017-11-01 DIAGNOSIS — Z794 Long term (current) use of insulin: Secondary | ICD-10-CM | POA: Diagnosis not present

## 2017-11-01 DIAGNOSIS — Z7902 Long term (current) use of antithrombotics/antiplatelets: Secondary | ICD-10-CM | POA: Diagnosis not present

## 2017-11-01 DIAGNOSIS — Z953 Presence of xenogenic heart valve: Secondary | ICD-10-CM | POA: Insufficient documentation

## 2017-11-01 DIAGNOSIS — Z91048 Other nonmedicinal substance allergy status: Secondary | ICD-10-CM | POA: Diagnosis not present

## 2017-11-01 DIAGNOSIS — N183 Chronic kidney disease, stage 3 unspecified: Secondary | ICD-10-CM

## 2017-11-01 DIAGNOSIS — Z79899 Other long term (current) drug therapy: Secondary | ICD-10-CM | POA: Insufficient documentation

## 2017-11-01 DIAGNOSIS — Z905 Acquired absence of kidney: Secondary | ICD-10-CM | POA: Insufficient documentation

## 2017-11-01 DIAGNOSIS — I2582 Chronic total occlusion of coronary artery: Secondary | ICD-10-CM | POA: Insufficient documentation

## 2017-11-01 DIAGNOSIS — I5032 Chronic diastolic (congestive) heart failure: Secondary | ICD-10-CM

## 2017-11-01 DIAGNOSIS — Z8571 Personal history of Hodgkin lymphoma: Secondary | ICD-10-CM | POA: Diagnosis not present

## 2017-11-01 DIAGNOSIS — E785 Hyperlipidemia, unspecified: Secondary | ICD-10-CM | POA: Insufficient documentation

## 2017-11-01 DIAGNOSIS — Z8249 Family history of ischemic heart disease and other diseases of the circulatory system: Secondary | ICD-10-CM | POA: Diagnosis not present

## 2017-11-01 DIAGNOSIS — Z950 Presence of cardiac pacemaker: Secondary | ICD-10-CM | POA: Insufficient documentation

## 2017-11-01 DIAGNOSIS — I208 Other forms of angina pectoris: Secondary | ICD-10-CM | POA: Diagnosis not present

## 2017-11-01 DIAGNOSIS — Z9013 Acquired absence of bilateral breasts and nipples: Secondary | ICD-10-CM | POA: Diagnosis not present

## 2017-11-01 DIAGNOSIS — M109 Gout, unspecified: Secondary | ICD-10-CM | POA: Insufficient documentation

## 2017-11-01 DIAGNOSIS — I480 Paroxysmal atrial fibrillation: Secondary | ICD-10-CM | POA: Insufficient documentation

## 2017-11-01 DIAGNOSIS — I2510 Atherosclerotic heart disease of native coronary artery without angina pectoris: Secondary | ICD-10-CM

## 2017-11-01 DIAGNOSIS — G4733 Obstructive sleep apnea (adult) (pediatric): Secondary | ICD-10-CM | POA: Diagnosis not present

## 2017-11-01 DIAGNOSIS — I130 Hypertensive heart and chronic kidney disease with heart failure and stage 1 through stage 4 chronic kidney disease, or unspecified chronic kidney disease: Secondary | ICD-10-CM | POA: Diagnosis not present

## 2017-11-01 DIAGNOSIS — Z7982 Long term (current) use of aspirin: Secondary | ICD-10-CM | POA: Diagnosis not present

## 2017-11-01 DIAGNOSIS — D509 Iron deficiency anemia, unspecified: Secondary | ICD-10-CM | POA: Diagnosis not present

## 2017-11-01 DIAGNOSIS — Z951 Presence of aortocoronary bypass graft: Secondary | ICD-10-CM | POA: Insufficient documentation

## 2017-11-01 LAB — CBC
HEMATOCRIT: 37.4 % (ref 36.0–46.0)
Hemoglobin: 12.4 g/dL (ref 12.0–15.0)
MCH: 30.8 pg (ref 26.0–34.0)
MCHC: 33.2 g/dL (ref 30.0–36.0)
MCV: 93 fL (ref 78.0–100.0)
Platelets: 114 10*3/uL — ABNORMAL LOW (ref 150–400)
RBC: 4.02 MIL/uL (ref 3.87–5.11)
RDW: 13.9 % (ref 11.5–15.5)
WBC: 7.9 10*3/uL (ref 4.0–10.5)

## 2017-11-01 LAB — COMPREHENSIVE METABOLIC PANEL
ALT: 38 U/L (ref 14–54)
ANION GAP: 13 (ref 5–15)
AST: 46 U/L — ABNORMAL HIGH (ref 15–41)
Albumin: 4.4 g/dL (ref 3.5–5.0)
Alkaline Phosphatase: 166 U/L — ABNORMAL HIGH (ref 38–126)
BUN: 45 mg/dL — ABNORMAL HIGH (ref 6–20)
CO2: 24 mmol/L (ref 22–32)
Calcium: 9.9 mg/dL (ref 8.9–10.3)
Chloride: 103 mmol/L (ref 101–111)
Creatinine, Ser: 1.38 mg/dL — ABNORMAL HIGH (ref 0.44–1.00)
GFR calc Af Amer: 46 mL/min — ABNORMAL LOW (ref >60)
GFR, EST NON AFRICAN AMERICAN: 39 mL/min — AB (ref >60)
Glucose, Bld: 187 mg/dL — ABNORMAL HIGH (ref 65–99)
POTASSIUM: 4.6 mmol/L (ref 3.5–5.1)
Sodium: 140 mmol/L (ref 135–145)
TOTAL PROTEIN: 7.6 g/dL (ref 6.5–8.1)
Total Bilirubin: 0.7 mg/dL (ref 0.3–1.2)

## 2017-11-01 LAB — PROTIME-INR
INR: 1.03
Prothrombin Time: 13.4 seconds (ref 11.4–15.2)

## 2017-11-01 NOTE — Patient Instructions (Signed)
Labs drawn today (if we do not call you, then your lab work was stable)   Your physician recommends that you schedule a follow-up appointment in: 2 months with Dr. Aundra Dubin

## 2017-11-03 NOTE — Progress Notes (Signed)
Patient ID: [name] [name], female   DOB: Apr 21, 1951, 67 y.o.   MRN: [id number] PCP: [name]  Cardiology: Dr. Aundra Dubin  67 yo with history of CAD s/p CABG in 12/05 and redo in 1/11, bioprosthetic AVR, diastolic CHF, and sick sinus syndrome s/p PCM presents for cardiology followup.  She had initial CABG with SVG-RCA in 12/05 followed by redo SVG-RCA and bioprosthetic AVR in 1/11.  She has had diastolic CHF and is on Lasix.  In 9/14, she had a CPX test.  This showed severe functional limitation with ischemic ECG changes (inferolateral ST depression and ST elevation in V1 and V2). She had chest pain.  She had R/LHC in 9/14.  This showed elevated left and right heart filling pressures, patent SVG-PDA, and 80% distal LM/80% ostial LAD stenosis.  She was not a candidate for redo CABG (had 2 prior sternotomies as well as chest radiation for Hodgkins lymphoma).  She had DES from left main into proximal LAD and will need long-term DAPT => Brilinta was used as she has been a poor responder to Plavix in the past.  She re-developed angina in 11/15 and was admitted with unstable angina.  LHC showed 80% ostial LM and patent SVG-RCA. She had DES to ostial LM. Echo in 1/18 showed EF 60-65%, normal bioprosthetic aortic valve, mild-moderate MR, PASP 35 mmHg.   She has chronic iron deficiency anemia thought to be due to a slow GI bleed.  Last EGD was unremarkable and a capsule endoscopy was also unremarkable.  She has had periodic transfusions; However, recently hemoglobin has been stable.    She had bilateral mastectomy for DCIS in 77/11 without complication.  She has an insulin pump followed by an endocrinologist at [facility].   Given myalgias with statins, she was started on Praluent.  LDL is now excellent.   In the past she has had short runs of atrial fibrillation.  She is not anticoagulated. Planned for Watchman placement, but given her nickel allergy, we were unable to place a Watchman.    Cardiolite in 2/18  showed no ischemia or infarction.     Diagnosed with OSA, now on CPAP.   Given ongoing problems with exertional dyspnea and occasional chest pain, Cardiolite was repeated in 9/18 and showed inferior changes.  She underwent left/right heart cath in 9/18.  This showed elevated filling pressures, R>L.  There was 75+% ostial LCx stenosis.  However, there were 2 layers of stent from the mid left main into the ostial LAD, and LCx intervention was thought to be very risky.  It was decided to manage her medically.  Echo was repeated in 10/18 showing EF 55-60%, moderate to severe TR.    Given recurrent chest pain, she had ETT-Cardiolite done in 3/19.  This was a normal study with no evidence for ischemia or infarction.   She returns today for follow up of CHF and CAD. Weight is down 2 lbs.  Still having episodes of chest pain => 2 episodes in the last 2 weeks, once while watching TV and the other while walking.  She also gets chest pain + dyspnea walking up an incline.  She was unable to take ranolazine due to nausea.  No dyspnea walking on flat ground.  No orthopnea/PND.    Labs (7/14): hemoglobin 11 Labs (9/14): K 3.9, creatinine 1.48, hemoglobin 7.3 Labs (10/14): hemoglobin 7.7 Labs (11/14): K 3.8, creatinine 1.5 Labs (12/14): HCT 37.7 Labs (1/15): LDL 106, HDL 41, K 3.9, creatinine 1.6, LFTs normal Labs (  2/15): K 3.7, creatinine 1.1, BNP 71 Labs (3/15): HCT 36.7 Labs (4/15): K 3.7, creatinine 1.1, HCT 35.5 Labs (7/15): HCT 36.4 Labs (11/15): HCT 32.1, K 4.2, creatinine 1.06 Labs (12/15): hgb 12 Labs (1/16): HCT 37.1 Labs (3/16): LDL 25, HDL 42, elevated transaminases Labs (6/16): HCT 39.6, plts 146 Labs (7/16): LDL 59, HDL 41, AST 34, ALT 49, AP 125 Labs (11/16): HCT 38.8, alkaline phosphatase 156, AST/ALT normal, K 4, creatinine 1.0 Labs (3/17): K 4.4, creatinine 1.4, LFTs normal, hgb 12.5 Labs (5/17): HCT 41.8, K 4, creatinine 1.3 Labs (9/17): K 4, creatinine 0.43, hgb 13.6 Labs (10/17):  LDL 42, HDL 43 Labs (1/18): K 4.3, creatinine 1.1 => 1.19, AST 66, ALT 67, hgb 14.2, BNP 72 Labs (2/18): K 3.9, creatinine 1.2, BNP 49.6, HCT 42.1 Labs (4/18): hgb 13.8 Labs (5/18): HCT 41.7, plts 98, K 4.3, creatinine 1.11, BNP 180 Labs (7/18): hgb 12, plts 110, K 4, creatinine 1.23, AST 38, ALT 44 Labs (8/18): K 5, creatinine 1.17 => 2.1, BUN 33 => 92, BNP 134 Labs (9/18): K 3.6, creatinine 1.42 => 1.44. Hgb 14 => 13.5 Labs (10/18): K 3.5, creatinine 1.21, hgb 11.9, LDL 16, HDL 40 Labs (12/18): K 3.7, creatinine 1.4 Labs (2/19): hgb 13.2 Labs @ [facility] (3/19): K 4.7, creatinine 1.4, ANA negative, RF negative Labs (4/19): K 3.9, creatinine 1.52  PMH: 1. Sick sinus syndrome s/p [employer] PCM.  2. HTN 3. H/o SBO 4. Renal cell carcinoma s/p right nephrectomy in 2008.  5. 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  Fibroscan in 11/14 showed advanced fibrosis concerning for cirrhosis. EGD in 11/14 showed no varices.  6. Diastolic CHF: Echo (2/12) with EF 60-65%, bioprosthetic aortic valve with mean gradient 15 mmHg, mild MR.  CPX (9/14): peak VO2 10.4, VE/VCO2 43.8, inferolateral ST depression and ST elevation in V1/V2 with severe dyspnea and chest pain, severe functional limitation with ischemic changes.  Echo (9/14) with EF 60-65%, normal RV size and systolic function, mild-moderate MR, bioprosthetic aortic valve normal, PA systolic pressure 51 mmHg, no evidence for pericardial constriction though exam incomplete for constriction.  Echo (11/15) with EF 55-60%, bioprosthetic aortic valve, mild-moderate MR, moderate TR, PA systolic pressure 42 mmHg.  - TEE (5/17) with EF 60-65%, normal bioprosthetic aortic valve with mean gradient 13 mmHg, normal RV size and systolic function. - Echo (1/18) with EF 60-65%, normal bioprosthetic aortic valve, mild-moderate MR, PASP 35 mmHg.  - Echo (10/18) with EF 55-60%, bioprosthetic aortic valve mean gradient 10 mmHg, MAC with mild mitral stenosis (mean gradient 7) and  mild mitral regurgitation, moderate to severe TR, PASP 43 mmHg.  - RHC (9/18): mean RA 13, PA 44/18 mean 33, mean PCWP 19, CI 3.45, PVR 2.7 WU.  7. TAH-BSO 8. Type I diabetes: Has insulin pump.  9. H/o ischemic colitis. 10. H/o PUD. 11. Diabetic gastroparesis. 12. Hyperlipidemia: Myalgias with Crestor and atorvastatin, elevated LFTs with simvastatin.  Myalgias with > 10 mg pravastatin. Now on Praluent.  13. CKD 14. Bicuspid aortic valve s/p 19 mm Edwards pericardial valve in 1/11 (had post-operative Dresslers syndrome).   15. CAD: CABG with SVG-RCA in 12/05.  Redo SVG-RCA in 1/11 with AVR.  LHC/RHC (9/14) with mean RA 18, PA 62/25 mean 43, mean PCWP 26, CI 2.8, 70-80% distal LM stenosis, 80% ostial LAD stenosis, total occlusion RCA, SVG-PDA patent.  Patient had DES LM into LAD.  Plan to continue long-term ASA/Brilinta (poor responder to Plavix).  ETT-cardiolite (4/15) with 6' exercise, EF 60%,  ST depression in recovery and mild chest pain, no ischemia or infarction by perfusion images.  Unstable angina 11/15 with 80% LM, patent SVG-RCA => DES to ostial LM.   - Lexiscan Cardiolite (2/18): EF 62%, no ischemia or infarction (normal).  She had a significant reaction to [place] and probably should not get again.  - Cardiolite (9/18): EF 72%, prior inferior infarct with mild peri-infarct ischemia.  - LHC (9/18): 2 layers stent left main - ostial LAD with 50% dLM in-stent restenosis, 75+% ostial LCx, totally occluded RCA, SVG-RCA patent.  Medical management planned.  - ETT-Cardiolite (4/19): EF normal, 7'11" exercise, no ischemia or infarction.  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  17. Anemia: Iron-deficiency.  Suspect chronic GI blood loss.  EGD in 9/14 was unremarkable.  Capsule endoscopy 10/14 was unremarkable.  Has required periodic blood transfusions.  18. Atrial fibrillation: Paroxysmal, noted by [facility] interrogation.  19. Carotid stenosis: Carotid  dopplers (2/15) with 40-59% bilateral stenosis. Carotid dopplers (3/16) with 40-59% bilateral ICA stenosis. Carotid dopplers (3/17) with 40-59% bilateral ICA stenosis.  - Carotid dopplers (3/18) with mild BICA stenosis.  20. 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  22. C difficile colitis 2017 23. Gout 24. ?TIA: head CT negative.  25. OSA: Moderate, uses CPAP.  26. Chronic thrombocytopenia  SH: Married, lives in [place], nonsmoker. Former school principal.  FH: CAD  ROS: All systems reviewed and negative except as per HPI.   Current Outpatient Medications  Medication Sig Dispense Refill  . Alirocumab (PRALUENT) 150 MG/ML SOPN Inject 1 pen into the skin every 14 (fourteen) days. 2 pen 11  . ALPRAZolam (XANAX) 0.5 MG tablet Take 0.5 mg by mouth at bedtime. May take additional dose daily for anxiety    . amLODipine (NORVASC) 5 MG tablet Take 0.5 tablets (2.5 mg total) by mouth daily. 45 tablet 3  . APIDRA 100 UNIT/ML injection Inject 46 Units into the skin as directed. INFUSE THROUGH INSULIN PUMP UTD::[place] 46.15 units daily, Bolus depends on carb intake  2  . aspirin EC 81 MG tablet Take 81 mg by mouth at bedtime.    Marland Kitchen azithromycin (ZITHROMAX) 500 MG tablet 1 tablet by mouth one hour prior to dental appointment/procedure 2 tablet 0  . Calcium Carbonate-Vitamin D (RA CALCIUM PLUS VITAMIN D) 600-400 MG-UNIT per tablet Take 1 tablet by mouth daily.     . Cholecalciferol 2000 UNITS TABS Take 1 tablet by mouth every morning.     . clobetasol (OLUX) 0.05 % topical foam Apply 1 application topically daily.  0  . Coenzyme Q10 (CO Q 10) 100 MG CAPS Take 1 capsule by mouth at bedtime.     . colchicine 0.6 MG tablet Take 0.6 mg by mouth at bedtime. And,Take 1 tablet at onset and repeate in 1 hour and then 1 daily x 2 (FOR FLARES)     . docusate sodium (COLACE) 100 MG capsule  Take 100 mg by mouth at bedtime.    . Fe Fum-FA-B Cmp-C-Zn-Mg-Mn-Cu (HEMATINIC PLUS VIT/MINERALS) 106-1 MG TABS TAKE 1 TABLET BY MOUTH THREE TIMES A WEEK(MONDAY, WEDNESDAY, AND FRIDAY) OR AS DIRECTED WITH A VITAMIN "C" 90 tablet 0  . fluticasone (FLONASE) 50 MCG/ACT nasal spray Place into both nostrils daily.    Marland Kitchen glucagon (GLUCAGON EMERGENCY) 1 MG injection Inject 1 mg into the vein once as needed (for severe reaction). Reported  on 12/05/2015    . isosorbide mononitrate (IMDUR) 120 MG 24 hr tablet Take 120 mg by mouth daily.    Marland Kitchen latanoprost (XALATAN) 0.005 % ophthalmic solution Place 1 drop into both eyes at bedtime.    Marland Kitchen loratadine (CLARITIN) 10 MG tablet Take 1 tablet (10 mg total) by mouth daily. 30 tablet 11  . Magnesium Oxide 250 MG TABS Take 250 mg by mouth at bedtime.     . metolazone (ZAROXOLYN) 2.5 MG tablet Take 2.5 mg (1 tab) once as directed by CHF clinic. Call before taking [phone] 3 tablet 0  . metoprolol tartrate (LOPRESSOR) 50 MG tablet TAKE 1 AND 1/2 TABLETS(75 MG) BY MOUTH TWICE DAILY 270 tablet 2  . Multiple Vitamins-Minerals (MULTIVITAMIN WITH MINERALS) tablet Take 1 tablet by mouth every morning.     . nitroGLYCERIN (NITROLINGUAL) 0.4 MG/SPRAY spray Place 1 spray under the tongue every 5 (five) minutes x 3 doses as needed for chest pain. 14.7 g 0  . pantoprazole (PROTONIX) 40 MG tablet Take 1 tablet (40 mg total) by mouth daily. 30 tablet 11  . Polyethyl Glycol-Propyl Glycol (SYSTANE) 0.4-0.3 % SOLN Apply 1 drop to eye daily as needed (dry eyes).    . potassium chloride (MICRO-K) 10 MEQ CR capsule Take 4 capsules (40 mEq total) by mouth 2 (two) times daily. 240 capsule 3  . pravastatin (PRAVACHOL) 10 MG tablet TAKE 1 TABLET BY MOUTH ONCE DAILY 90 tablet 3  . spironolactone (ALDACTONE) 25 MG tablet Take 0.5 tablets (12.5 mg total) by mouth daily. 45 tablet 3  . terconazole (TERAZOL 7) 0.4 % vaginal cream Place 1 applicator vaginally at bedtime. 45 g 0  . ticagrelor  (BRILINTA) 60 MG TABS tablet TAKE 1 TABLET(60 MG) BY MOUTH TWICE DAILY 180 tablet 2  . torsemide (DEMADEX) 20 MG tablet Take 41m in the AM and 623min the PM daily. 240 tablet 6  . triamcinolone cream (KENALOG) 0.5 % Apply 1 application topically daily as needed (inflamation).     . Marland KitchenLORIC 40 MG tablet Take 40 mg by mouth at bedtime.   [place] Name: So Clean CPAP cleaning system 1 each 0  . vitamin C (ASCORBIC ACID) 500 MG tablet Take 500 mg by mouth 3 (three) times a week.     No current facility-administered medications for this encounter.    Filed Weights   11/01/17 1518  Weight: 120 lb (54.4 kg)    Pulse 78   Wt 120 lb (54.4 kg)   SpO2 100%   BMI 21.95 kg/m  General: NAD Neck: No JVD, no thyromegaly or thyroid nodule.  Lungs: Slightly decreased breath sounsd right base.  CV: Nondisplaced PMI.  Heart regular S1/S2, no S3/S4, 2/6 SEM RUSB.  No peripheral edema.  No carotid bruit.  Normal pedal pulses.  Abdomen: Soft, nontender, no hepatosplenomegaly, mild distention.  Skin: Intact without lesions or rashes.  Neurologic: Alert and oriented x 3.  Psych: Normal affect. Extremities: No clubbing or cyanosis.  HEENT: Normal.    Assessment/Plan:  1. CAD: s/p SVG-RCA in 12/05, then redo SVG-RCA in 1/11--> distal left main/proximal LAD severe stenosis in 9/14.  PCI with DES to distal LM/proximal LAD was done rather than bypass as she would have been a poor candidate for redo sternotomy (2 prior sternotomies and history of chest wall radiation for Hodgkins disease).  Recurrent unstable angina with 80% ostial LM treated with repeat DES in 11/15.  Lexiscan Cardiolite in 9/18 inferior  changes.  Repeat LHC (9/18) showed 50% distal left main in-stent restenosis and 75+% ostial LCx stenosis. I reviewed the films at the time with interventional cardiology => would be very difficult to intervene on the ostial LCx through 2 overlapping stents from left main into the LAD.  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  Given no ACS, we planned medical management and increased imdur.  She has been having more chest pain, both typical and atypical recently.  However, Cardiolite in 3/19 showed no ischemia or infarction.  - Continue Brilinta 60 mg bid (poor responder to plavix) long-term given recurrent left main disease.  This has been complicated in the past by suspected slow GI bleed. Most recent hemoglobin was normal. - Continue ASA 81, statin, metoprolol, and Imdur 120 mg daily.    - She was unable to tolerate ranolazine.  - Given reassuring Cardiolite, will hold off on cath for now but she will let me know if symptoms worsen.  2. Bioprosthetic aortic valve replacement: In setting of bicuspid aortic valve, thoracic aorta not dilated on 2017 TEE.  Valve looked ok on 10/18 echo.   3. Chronic diastolic CHF: Most recent echo in 10/18 showed normal LV systolic function but also showed moderate to severe TR.  RHC was concerning for right > left heart failure.  I am concerned that her volume overload and dyspnea are related to primarily RV failure at this point. NYHA class II. She does not appear significantly volume overloaded. . - Continue torsemide 80 qam/60 qpm. Take metolazone 2.5 mg Wednesday and Friday. BMET today. - Continue spironolactone 12.5 mg daily. 4. CKD:  Patient has a single kidney s/p right nephrectomy.  CKD stage 3.  BMET today.  5. Hyperlipidemia: She has not been able to tolerate any higher potency statin than pravastatin 10 mg daily.   - Continue pravastatin and Praluent, good lipids 10/18.  6. Anemia: Suspected slow GI bleed.  Unremarkable EGD and capsule endoscopy in fall 2014.  Unfortunately, needs to stay on Brilinta long-term (Plavix non-responder).  Will need to follow closely (sees hematology). Recently, hemoglobin has been more stable.   7. Atrial fibrillation: Paroxysmal, brief episodes noted on PCM interrogation. Given GI  bleeding and need to be on Brilinta, risks have been thought to outweigh benefits of anticoagulation.  She is not a good Multaq candidate with CHF.  If she needs an antiarrhythmic, consider Tikosyn or Sotalol.   - Regular on exam - We were unable to place Watchman device due to nickel allergy.   8. Carotid bruit: Only mild stenosis on last dopplers in 3/18.  9. SSS: Pacemaker in place.  10. HTN: controlled. Continue current regimen.   11. OSA: Moderate, using CPAP.  12. ?TIA: Transient dysarthria in 3/18, could have been afib-related TIA.  No recurrent symptoms.  13. Thrombocytopenia: Chronic. Sees hematology Thursday.   Followup 2 months.   [name] 11/03/2017

## 2017-11-06 ENCOUNTER — Telehealth (HOSPITAL_COMMUNITY): Payer: Self-pay | Admitting: *Deleted

## 2017-11-06 NOTE — Telephone Encounter (Signed)
I called patient back and after talking with her she is going to call her PCP first and see if they can see her.  If she decides she does want [name] to check her labs she will call back and schedule but she wants to talk with he PCP first. No further questions.

## 2017-11-06 NOTE — Telephone Encounter (Signed)
Advanced Heart Failure Triage Encounter  Patient Name: [name] [name]  Date of Call: 11/06/17  Problem:  Patient called back this morning complaining of severe leg cramps, she hasn't slept in 2 days.  She stated "these are not the typical charlie horse cramps, these are different."  Went on to describe cramps starting in her toes and moving all up her legs especially her right leg.  Mostly on the outside of legs into her thighs.  She wants to know what can be done because these are intolerable.   Plan:  Will forward to Barrington Ellison, [place] for review.   Darron Doom, RN

## 2017-11-06 NOTE — Telephone Encounter (Signed)
She can have BMET and Mg checked, but historically her electrolytes have been stable even with these cramps.  She needs to follow up with her PCP for further control of cramps/pain.

## 2017-11-07 ENCOUNTER — Ambulatory Visit: Payer: Medicare Other | Admitting: Family Medicine

## 2017-11-07 ENCOUNTER — Encounter: Payer: Self-pay | Admitting: Family Medicine

## 2017-11-07 ENCOUNTER — Other Ambulatory Visit: Payer: Self-pay

## 2017-11-07 VITALS — BP 116/60 | HR 78 | Temp 98.9°F | Resp 12 | Ht 63.0 in | Wt 119.0 lb

## 2017-11-07 DIAGNOSIS — R252 Cramp and spasm: Secondary | ICD-10-CM | POA: Diagnosis not present

## 2017-11-07 NOTE — Progress Notes (Signed)
Chief Complaint  Patient presents with  . Leg Pain    "severe" cramps  . Insomnia   Usual PCP is [name], MD She is here today for an acute visit. She is a pleasant but very talkative patient. She is scheduled herself to see me evaluate severe leg cramps at night that have been keeping her awake for 2 weeks.  They appear to be getting worse over times.  At first it was occasional, notes every night several times a night.  The pain is so bad that she cannot move.  Her husband rubs that area and will put a heating pad on it, and after 30 minutes she will finally feel the muscle release.  It feels like a muscle spasm.  She points to the Peroneus longus tendon of the lateral ankle on the left but it happens in the left on the right, left more often. No change in medicine.  No change in foods.  No change in supplements.  No change in fluid intake. This all started when she had a treadmill stress test on 10/21/2017.  Is also been in cardiac rehab.  She is increased her walking and her activity.  She states that she wears a diabetic shoe with a custom insole.  She does not feel pain or tiredness in the specific area with exercise. Comments that in addition her sugars been hard to control.  She is been very careful counting her carbs, giving herself boluses, and paying attention to her sugar 5 times a day.  When her insulin pump was interrogated showed that she was at goal with only 40% of the time.  Normally she can stay at goal 85% of the time with good hemoglobin A1c's. We discussed Xanax at night.  She states she takes one half of a 0.5 mg pill nightly.  This helps her sleep.  Patient Active Problem List   Diagnosis Date Noted  . OSA on CPAP 04/24/2017  . Genetic testing 01/14/2017  . Insulin pump status 09/12/2016  . S/P left mastectomy 07/29/2016  . History of breast cancer 03/29/2016  . Dense breast 12/11/2015  . Chronic anticoagulation 12/11/2015  . Hx of radiation therapy  04/23/2015  . Thrombocytopenia ([place]) 04/23/2015  . 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 Radiation therapy complication 09/98/3382  . History of nodular sclerosis Hodgkin's disease 10/27/2014  . Insulin dependent type 1 diabetes mellitus ([place]) 10/07/2014  . Claudication ([place]) 07/29/2014  . PAF (paroxysmal atrial fibrillation) ([place])   . Coronary artery disease involving native coronary artery of native heart with unstable angina pectoris ([place])   . Unstable angina ([place]) 04/11/2013  . Chronic diastolic CHF (congestive heart failure) ([place]) 04/03/2013  . Artificial cardiac pacemaker 03/12/2013  . Chronic kidney disease (CKD), stage III (moderate) ([place]) 03/12/2013  . Big thyroid 03/12/2013  . Encounter for fitting or adjustment of insulin pump 03/12/2013  . Congestive heart failure ([place]) 03/12/2013  . Lipoma of neck 09/23/2012  . Pacemaker 09/08/2012  . Atrial flutter ([place]) 07/23/2012  . Sick sinus syndrome ([place]) 07/21/2012  . Gastroparesis due to DM ([place]) 05/01/2012  . Hypercalcemia   . Cholelithiasis 07/20/2011  . Aortic stenosis status post 19 mm Edwards pericardial valve replacement 2011   . Arteriosclerotic cardiovascular disease (ASCVD)   . Osteopenia   . NASH (nonalcoholic steatohepatitis)   . Renal cell carcinoma (HCC)     Class: History of  .  Duodenal ulcer     Class: History of  . Ischemic colitis     Class: History of  . Gastroesophageal reflux disease   . Hypertension   . Small bowel obstruction ([place])   . Hyperlipidemia 04/11/2010  . Colonic polyps, adenomatous 09/29/2009    Outpatient Encounter Medications as of 11/07/2017  Medication Sig  . Alirocumab (PRALUENT) 150 MG/ML SOPN Inject 1 pen into the skin every 14 (fourteen) days.  . ALPRAZolam (XANAX) 0.5 MG tablet Take 0.5 mg by mouth at bedtime. May take additional dose daily for anxiety  . amLODipine (NORVASC) 5 MG tablet  Take 0.5 tablets (2.5 mg total) by mouth daily.  . APIDRA 100 UNIT/ML injection Inject 46 Units into the skin as directed. INFUSE THROUGH INSULIN PUMP UTD::[place] 46.15 units daily, Bolus depends on carb intake  . aspirin EC 81 MG tablet Take 81 mg by mouth at bedtime.  Marland Kitchen azithromycin (ZITHROMAX) 500 MG tablet 1 tablet by mouth one hour prior to dental appointment/procedure  . Calcium Carbonate-Vitamin D (RA CALCIUM PLUS VITAMIN D) 600-400 MG-UNIT per tablet Take 1 tablet by mouth daily.   . Cholecalciferol 2000 UNITS TABS Take 1 tablet by mouth every morning.   . clobetasol (OLUX) 0.05 % topical foam Apply 1 application topically daily.  . clobetasol (TEMOVATE) 0.05 % external solution Apply 1 application topically as directed.  . Coenzyme Q10 (CO Q 10) 100 MG CAPS Take 1 capsule by mouth at bedtime.   . colchicine 0.6 MG tablet Take 0.6 mg by mouth at bedtime. And,Take 1 tablet at onset and repeate in 1 hour and then 1 daily x 2 (FOR FLARES)   . docusate sodium (COLACE) 100 MG capsule Take 100 mg by mouth at bedtime.  . Fe Fum-FA-B Cmp-C-Zn-Mg-Mn-Cu (HEMATINIC PLUS VIT/MINERALS) 106-1 MG TABS TAKE 1 TABLET BY MOUTH THREE TIMES A WEEK(MONDAY, WEDNESDAY, AND FRIDAY) OR AS DIRECTED WITH A VITAMIN "C"  . fluticasone (FLONASE) 50 MCG/ACT nasal spray Place into both nostrils daily.  Marland Kitchen glucagon (GLUCAGON EMERGENCY) 1 MG injection Inject 1 mg into the vein once as needed (for severe reaction). Reported on 12/05/2015  . isosorbide mononitrate (IMDUR) 120 MG 24 hr tablet Take 120 mg by mouth daily.  Marland Kitchen latanoprost (XALATAN) 0.005 % ophthalmic solution Place 1 drop into both eyes at bedtime.  Marland Kitchen loratadine (CLARITIN) 10 MG tablet Take 1 tablet (10 mg total) by mouth daily.  . Magnesium Oxide 250 MG TABS Take 250 mg by mouth at bedtime.   . metolazone (ZAROXOLYN) 2.5 MG tablet Take 2.5 mg (1 tab) once as directed by CHF clinic. Call before taking [phone]  . metoprolol tartrate (LOPRESSOR) 50 MG tablet  TAKE 1 AND 1/2 TABLETS(75 MG) BY MOUTH TWICE DAILY  . Multiple Vitamins-Minerals (MULTIVITAMIN WITH MINERALS) tablet Take 1 tablet by mouth every morning.   . nitroGLYCERIN (NITROLINGUAL) 0.4 MG/SPRAY spray Place 1 spray under the tongue every 5 (five) minutes x 3 doses as needed for chest pain.  . pantoprazole (PROTONIX) 40 MG tablet Take 1 tablet (40 mg total) by mouth daily.  Vladimir Faster Glycol-Propyl Glycol (SYSTANE) 0.4-0.3 % SOLN Apply 1 drop to eye daily as needed (dry eyes).  . potassium chloride (MICRO-K) 10 MEQ CR capsule Take 4 capsules (40 mEq total) by mouth 2 (two) times daily.  . pravastatin (PRAVACHOL) 10 MG tablet TAKE 1 TABLET BY MOUTH ONCE DAILY  . spironolactone (ALDACTONE) 25 MG tablet Take 0.5 tablets (12.5 mg total) by mouth daily.  Marland Kitchen terconazole (TERAZOL  7) 0.4 % vaginal cream Place 1 applicator vaginally at bedtime.  . ticagrelor (BRILINTA) 60 MG TABS tablet TAKE 1 TABLET(60 MG) BY MOUTH TWICE DAILY  . torsemide (DEMADEX) 20 MG tablet Take 60m in the AM and 667min the PM daily.  . Marland Kitchenriamcinolone cream (KENALOG) 0.5 % Apply 1 application topically daily as needed (inflamation).   . Marland KitchenLORIC 40 MG tablet Take 40 mg by mouth at bedtime.   . Marland KitchenNABLE TO FIND Med Name: So Clean CPAP cleaning system  . vitamin C (ASCORBIC ACID) 500 MG tablet Take 500 mg by mouth 3 (three) times a week.   No facility-administered encounter medications on file as of 11/07/2017.     Allergies  Allergen Reactions  . Avandia [Rosiglitazone Maleate] Other (See Comments)    Congestive heart failure  . Cephalexin Anaphylaxis, Swelling and Rash    Extremities swell , throat swelling   . Clindamycin Hcl Anaphylaxis and Shortness Of Breath  . Lincomycin Anaphylaxis and Shortness Of Breath  . Metformin Other (See Comments)    ? Congestive heart failure ?  . Marland Kitchenovolog [Insulin Aspart] Hives and Itching    Humalog & Novolog big knots and whelp    . Penicillins Anaphylaxis, Hives, Swelling and Rash     Has patient had a PCN reaction causing immediate rash, facial/tongue/throat swelling, SOB or lightheadedness with hypotension: Yes Has patient had a PCN reaction causing severe rash involving mucus membranes or skin necrosis: No Has patient had a PCN reaction that required hospitalization No Has patient had a PCN reaction occurring within the last 10 years: No If all of the above answers are "NO", then may proceed with Cephalosporin use.   . Tizanidine Other (See Comments)    Dizziness, Mental Status Changes, Hallucination    . Adhesive [Tape] Other (See Comments)    Tears skin, Please use "paper" tape  . Atorvastatin Other (See Comments)    increased LFT's, no muscle weakness, leg pain Muscle and joint pain increased LFT's   . Cholestyramine Other (See Comments)    Liver Disorder Elevated LFTs   . Crestor [Rosuvastatin] Nausea And Vomiting, Nausea Only and Other (See Comments)    Muscle and joint pain & increased LFTs; tolerates Pravastatin 1071mmax)  . Gentamycin [Gentamicin] Hives, Itching and Rash    Fine red bumps.  Reaction noted post loop recorder implant, where gentamycin was used for irrigation. Topical prep  . Humalog [Insulin Lispro] Hives, Itching and Other (See Comments)    Pt uses Apidra   . Iodinated Diagnostic Agents Other (See Comments)    PAIN DURING LYMPHANGIOGRAM '81, NO ALLERGY TO IV CONTRAST. PAIN DURING LYMPHANGIOGRAM '86, NO ALLERGY TO IV CONTRAST  . Limonene Hives, Itching, Rash and Other (See Comments)    Reaction noted post loop recorder implant, where gentamycin was used for irrigation. Topical prep  . Pravastatin Other (See Comments)    In high doses (20-62m70mauses muscle and joint pain   . Prednisone Itching and Other (See Comments)    B/P went high, itching all over   . Ranexa [Ranolazine] Other (See Comments)    Nausea, dizziness, low blood sugar, tingling in right hand, "blood blisters" on tongue   . Simvastatin Other (See Comments)     Increased LFT's  . Strawberry Extract Hives, Itching, Rash and Other (See Comments)    Reaction noted post loop recorder implant, where gentamycin was used for irrigation. Topical prep  . Doxycycline     Blisters in mouth  .  Plavix [Clopidogrel Bisulfate] Other (See Comments)    P2Y12 testing = 271 while on Plavix  . Codeine Nausea And Vomiting  . Erythromycin Nausea And Vomiting  . Hydrocodone-Acetaminophen Itching, Rash and Other (See Comments)    itching  . Latex Itching and Other (See Comments)    I"if wear gloves hands get red ,itchy no prioblem if other wear and touch me"  . Neomycin Rash and Other (See Comments)    Redness  . Nickel Itching  . Ranitidine Nausea Only  . Tamiflu [Oseltamivir] Nausea And Vomiting  . Tramadol Nausea Only    Review of Systems  Constitutional: Negative for activity change, appetite change, diaphoresis, fever and unexpected weight change.  HENT: Negative for congestion, rhinorrhea, sore throat and trouble swallowing.   Eyes: Negative for redness and visual disturbance.  Respiratory: Negative for cough and shortness of breath.   Cardiovascular: Negative for chest pain, palpitations and leg swelling.  Gastrointestinal: Negative for abdominal pain, blood in stool, constipation and diarrhea.  Endocrine: Positive for polyuria. Negative for polyphagia.  Genitourinary: Positive for frequency. Negative for dysuria, hematuria and urgency.  Musculoskeletal: Negative for arthralgias, gait problem, myalgias and neck stiffness.       Pain and cramping, muscle spasm in the lateral lower leg left greater than right, nocturnal  Skin: Negative for color change and rash.  Neurological: Negative for syncope, weakness and numbness.  Hematological: Negative for adenopathy. Bruises/bleeds easily.  Psychiatric/Behavioral: Positive for sleep disturbance. Negative for dysphoric mood. The patient is not nervous/anxious.     Physical Exam  Constitutional: She is oriented  to person, place, and time. She appears well-developed and well-nourished. No distress.  HENT:  Head: Normocephalic and atraumatic.  Mouth/Throat: Oropharynx is clear and moist.  Eyes: Pupils are equal, round, and reactive to light. Conjunctivae are normal.  Cardiovascular: Normal rate and regular rhythm.  Murmur heard. Pulmonary/Chest: Effort normal and breath sounds normal. She has no wheezes.  Musculoskeletal:  Normal gait.  Normal appearance of palpation of the thigh, knee joint, and lower leg.  There is mild tenderness to palpation at the upper portion portion of the peroneal tendon at the attachment to the muscle.  Neurological: She is alert and oriented to person, place, and time. No sensory deficit. She exhibits normal muscle tone.  Psychiatric: She has a normal mood and affect. Her behavior is normal.    BP 116/60   Pulse 78   Temp 98.9 F (37.2 C) (Temporal)   Resp 12   Ht 5' 3"  (1.6 m)   Wt 119 lb 0.6 oz (54 kg)   SpO2 98%   BMI 21.09 kg/m    ASSESSMENT/PLAN:  1. Muscle cramp, nocturnal I think this is basically due to overuse of the muscles with her increased walking.  The muscular irritation is presenting itself with spasm at night.  We discussed that she should stretch the tendon, legs in general prior to exercising and then again before bed.  She is to keep up with her fluid intake.  She is had her basic electrolytes checked a couple of times since the stress test.  She has not had her magnesium checked lately.  I am going to send her for electrolytes. - Magnesium - BASIC METABOLIC PANEL WITH GFR We discussed that Xanax has mild muscle relaxing actions.  I am going to have her take a full 0.5 mg at bedtime for the next 3 nights.  The plan is to see if activity modification, stretching, and Xanax will  help her sleep better.  Discussed that she may need a shoe insert to prevent strain on the peroneal tendon, this could be provided by podiatrist or foot and ankle  orthopedist.  I discussed with her that Dr. Sharol Given would be a good choice. Patient Instructions  Continue to walk daily Stretch legs before exercise,and before bed Check labs today I will call Monday Double the xanax to a whole pill one hour before bed    Raylene Everts, MD

## 2017-11-07 NOTE — Patient Instructions (Addendum)
Continue to walk daily Stretch legs before exercise,and before bed Check labs today I will call Monday Double the xanax to a whole pill one hour before bed

## 2017-11-08 LAB — BASIC METABOLIC PANEL WITH GFR
BUN / CREAT RATIO: 34 (calc) — AB (ref 6–22)
BUN: 56 mg/dL — ABNORMAL HIGH (ref 7–25)
CHLORIDE: 97 mmol/L — AB (ref 98–110)
CO2: 30 mmol/L (ref 20–32)
Calcium: 10.3 mg/dL (ref 8.6–10.4)
Creat: 1.63 mg/dL — ABNORMAL HIGH (ref 0.50–0.99)
GFR, Est African American: 38 mL/min/{1.73_m2} — ABNORMAL LOW (ref >=60)
GFR, Est Non African American: 33 mL/min/{1.73_m2} — ABNORMAL LOW (ref >=60)
GLUCOSE: 249 mg/dL — AB (ref 65–139)
POTASSIUM: 4.2 mmol/L (ref 3.5–5.3)
SODIUM: 137 mmol/L (ref 135–146)

## 2017-11-08 LAB — MAGNESIUM: MAGNESIUM: 2.6 mg/dL — AB (ref 1.5–2.5)

## 2017-11-18 ENCOUNTER — Ambulatory Visit (INDEPENDENT_AMBULATORY_CARE_PROVIDER_SITE_OTHER): Payer: Medicare Other | Admitting: *Deleted

## 2017-11-18 DIAGNOSIS — I495 Sick sinus syndrome: Secondary | ICD-10-CM | POA: Diagnosis not present

## 2017-11-18 NOTE — Progress Notes (Signed)
Remote pacemaker transmission.   

## 2017-11-19 ENCOUNTER — Encounter: Payer: Self-pay | Admitting: Cardiology

## 2017-11-19 ENCOUNTER — Other Ambulatory Visit (HOSPITAL_COMMUNITY): Payer: Self-pay | Admitting: *Deleted

## 2017-11-19 MED ORDER — POTASSIUM CHLORIDE ER 10 MEQ PO CPCR: 40.0000 meq | ORAL_CAPSULE | Freq: Two times a day (BID) | ORAL | 3 refills | Status: DC

## 2017-11-20 LAB — CUP PACEART REMOTE DEVICE CHECK
Battery Impedance: 234 Ohm
Battery Remaining Longevity: 142 mo
Battery Voltage: 2.81 V
Brady Statistic AP VP Percent: 0 %
Brady Statistic AP VS Percent: 0 %
Brady Statistic AS VP Percent: 0 %
Brady Statistic AS VS Percent: 100 %
Date Time Interrogation Session: 20190429134736
Implantable Lead Implant Date: 20140130
Implantable Lead Implant Date: 20140130
Implantable Lead Location: 753859
Implantable Lead Location: 753860
Implantable Lead Model: 5076
Implantable Lead Model: 5076
Implantable Pulse Generator Implant Date: 20140130
Lead Channel Impedance Value: 435 Ohm
Lead Channel Impedance Value: 621 Ohm
Lead Channel Pacing Threshold Amplitude: 0.375 V
Lead Channel Pacing Threshold Amplitude: 0.625 V
Lead Channel Pacing Threshold Pulse Width: 0.4 ms
Lead Channel Pacing Threshold Pulse Width: 0.4 ms
Lead Channel Setting Pacing Amplitude: 1.5 V
Lead Channel Setting Pacing Amplitude: 2.5 V
Lead Channel Setting Pacing Pulse Width: 0.4 ms
Lead Channel Setting Sensing Sensitivity: 5.6 mV

## 2017-11-21 ENCOUNTER — Ambulatory Visit: Payer: Medicare Other | Admitting: Internal Medicine

## 2017-11-21 ENCOUNTER — Encounter: Payer: Self-pay | Admitting: Internal Medicine

## 2017-11-21 VITALS — BP 118/64 | HR 73 | Ht 62.5 in | Wt 118.8 lb

## 2017-11-21 DIAGNOSIS — I480 Paroxysmal atrial fibrillation: Secondary | ICD-10-CM | POA: Diagnosis not present

## 2017-11-21 DIAGNOSIS — I208 Other forms of angina pectoris: Secondary | ICD-10-CM | POA: Diagnosis not present

## 2017-11-21 DIAGNOSIS — I5032 Chronic diastolic (congestive) heart failure: Secondary | ICD-10-CM | POA: Diagnosis not present

## 2017-11-21 NOTE — Patient Instructions (Signed)
Medication Instructions:  Your physician recommends that you continue on your current medications as directed. Please refer to the Current Medication list given to you today.   Labwork: NONE   Testing/Procedures: NONE   Follow-Up: Your physician wants you to follow-up in: 1 Year with Dr. Lovena Le. You will receive a reminder letter in the mail two months in advance. If you don't receive a letter, please call our office to schedule the follow-up appointment.   Any Other Special Instructions Will Be Listed Below (If Applicable).     If you need a refill on your cardiac medications before your next appointment, please call your pharmacy.  Thank you for choosing [facility]!

## 2017-11-21 NOTE — Progress Notes (Signed)
HPI [name] [name] returns today for ongoing evaluation of paroxysmal atrial fibrillation, sinus node dysfunction, status post pacemaker insertion, coronary artery disease status post stenting, prior bypass surgery with aortic valve replacement.  The patient has had some exertional chest pressure and undergone extensive evaluation by Dr. Algernon Huxley.  She has known coronary disease but is not easily amenable to revascularization.  The patient has class II anginal symptoms.  She denies medical noncompliance. Allergies  Allergen Reactions  . Avandia [Rosiglitazone Maleate] Other (See Comments)    Congestive heart failure  . Cephalexin Anaphylaxis, Swelling and Rash    Extremities swell , throat swelling   . Clindamycin Hcl Anaphylaxis and Shortness Of Breath  . Lincomycin Anaphylaxis and Shortness Of Breath  . Metformin Other (See Comments)    ? Congestive heart failure ?  Marland Kitchen Novolog [Insulin Aspart] Hives and Itching    Humalog & Novolog big knots and whelp    . Penicillins Anaphylaxis, Hives, Swelling and Rash    Has patient had a PCN reaction causing immediate rash, facial/tongue/throat swelling, SOB or lightheadedness with hypotension: Yes Has patient had a PCN reaction causing severe rash involving mucus membranes or skin necrosis: No Has patient had a PCN reaction that required hospitalization No Has patient had a PCN reaction occurring within the last 10 years: No If all of the above answers are "NO", then may proceed with Cephalosporin use.   . Tizanidine Other (See Comments)    Dizziness, Mental Status Changes, Hallucination    . Adhesive [Tape] Other (See Comments)    Tears skin, Please use "paper" tape  . Atorvastatin Other (See Comments)    increased LFT's, no muscle weakness, leg pain Muscle and joint pain increased LFT's   . Cholestyramine Other (See Comments)    Liver Disorder Elevated LFTs   . Crestor [Rosuvastatin] Nausea And Vomiting, Nausea Only and Other  (See Comments)    Muscle and joint pain & increased LFTs; tolerates Pravastatin 42m (max)  . Gentamycin [Gentamicin] Hives, Itching and Rash    Fine red bumps.  Reaction noted post loop recorder implant, where gentamycin was used for irrigation. Topical prep  . Humalog [Insulin Lispro] Hives, Itching and Other (See Comments)    Pt uses Apidra   . Iodinated Diagnostic Agents Other (See Comments)    PAIN DURING LYMPHANGIOGRAM '81, NO ALLERGY TO IV CONTRAST. PAIN DURING LYMPHANGIOGRAM '86, NO ALLERGY TO IV CONTRAST  . Limonene Hives, Itching, Rash and Other (See Comments)    Reaction noted post loop recorder implant, where gentamycin was used for irrigation. Topical prep  . Pravastatin Other (See Comments)    In high doses (20-426m causes muscle and joint pain   . Prednisone Itching and Other (See Comments)    B/P went high, itching all over   . Ranexa [Ranolazine] Other (See Comments)    Nausea, dizziness, low blood sugar, tingling in right hand, "blood blisters" on tongue   . Simvastatin Other (See Comments)    Increased LFT's  . Strawberry Extract Hives, Itching, Rash and Other (See Comments)    Reaction noted post loop recorder implant, where gentamycin was used for irrigation. Topical prep  . Doxycycline     Blisters in mouth  . Plavix [Clopidogrel Bisulfate] Other (See Comments)    P2Y12 testing = 271 while on Plavix  . Codeine Nausea And Vomiting  . Erythromycin Nausea And Vomiting  . Hydrocodone-Acetaminophen Itching, Rash and Other (See Comments)    itching  .  Latex Itching and Other (See Comments)    I"if wear gloves hands get red ,itchy no prioblem if other wear and touch me"  . Neomycin Rash and Other (See Comments)    Redness  . Nickel Itching  . Ranitidine Nausea Only  . Tamiflu [Oseltamivir] Nausea And Vomiting  . Tramadol Nausea Only     Current Outpatient Medications  Medication Sig Dispense Refill  . Alirocumab (PRALUENT) 150 MG/ML SOPN Inject 1 pen into the  skin every 14 (fourteen) days. 2 pen 11  . ALPRAZolam (XANAX) 0.5 MG tablet Take 0.5 mg by mouth at bedtime. May take additional dose daily for anxiety    . amLODipine (NORVASC) 5 MG tablet Take 0.5 tablets (2.5 mg total) by mouth daily. 45 tablet 3  . APIDRA 100 UNIT/ML injection Inject 46 Units into the skin as directed. INFUSE THROUGH INSULIN PUMP UTD::[place] 46.15 units daily, Bolus depends on carb intake  2  . aspirin EC 81 MG tablet Take 81 mg by mouth at bedtime.    Marland Kitchen azithromycin (ZITHROMAX) 500 MG tablet 1 tablet by mouth one hour prior to dental appointment/procedure 2 tablet 0  . Calcium Carbonate-Vitamin D (RA CALCIUM PLUS VITAMIN D) 600-400 MG-UNIT per tablet Take 1 tablet by mouth daily.     . Cholecalciferol 2000 UNITS TABS Take 1 tablet by mouth every morning.     . clobetasol (OLUX) 0.05 % topical foam Apply 1 application topically daily.  0  . clobetasol (TEMOVATE) 0.05 % external solution Apply 1 application topically as directed.    . Coenzyme Q10 (CO Q 10) 100 MG CAPS Take 1 capsule by mouth at bedtime.     . colchicine 0.6 MG tablet Take 0.6 mg by mouth at bedtime. And,Take 1 tablet at onset and repeate in 1 hour and then 1 daily x 2 (FOR FLARES)     . docusate sodium (COLACE) 100 MG capsule Take 100 mg by mouth at bedtime.    . Fe Fum-FA-B Cmp-C-Zn-Mg-Mn-Cu (HEMATINIC PLUS VIT/MINERALS) 106-1 MG TABS TAKE 1 TABLET BY MOUTH THREE TIMES A WEEK(MONDAY, WEDNESDAY, AND FRIDAY) OR AS DIRECTED WITH A VITAMIN "C" 90 tablet 0  . fluticasone (FLONASE) 50 MCG/ACT nasal spray Place into both nostrils daily.    Marland Kitchen glucagon (GLUCAGON EMERGENCY) 1 MG injection Inject 1 mg into the vein once as needed (for severe reaction). Reported on 12/05/2015    . isosorbide mononitrate (IMDUR) 120 MG 24 hr tablet Take 120 mg by mouth daily.    Marland Kitchen latanoprost (XALATAN) 0.005 % ophthalmic solution Place 1 drop into both eyes at bedtime.    . Magnesium Oxide 250 MG TABS Take 250 mg by mouth at bedtime.     .  metolazone (ZAROXOLYN) 2.5 MG tablet Take 2.5 mg (1 tab) once as directed by CHF clinic. Call before taking [phone] 3 tablet 0  . metoprolol tartrate (LOPRESSOR) 50 MG tablet TAKE 1 AND 1/2 TABLETS(75 MG) BY MOUTH TWICE DAILY 270 tablet 2  . Multiple Vitamins-Minerals (MULTIVITAMIN WITH MINERALS) tablet Take 1 tablet by mouth every morning.     . nitroGLYCERIN (NITROLINGUAL) 0.4 MG/SPRAY spray Place 1 spray under the tongue every 5 (five) minutes x 3 doses as needed for chest pain. 14.7 g 0  . pantoprazole (PROTONIX) 40 MG tablet Take 1 tablet (40 mg total) by mouth daily. 30 tablet 11  . Polyethyl Glycol-Propyl Glycol (SYSTANE) 0.4-0.3 % SOLN Apply 1 drop to eye daily as needed (dry eyes).    . potassium  chloride (MICRO-K) 10 MEQ CR capsule Take 4 capsules (40 mEq total) by mouth 2 (two) times daily. 720 capsule 3  . pravastatin (PRAVACHOL) 10 MG tablet TAKE 1 TABLET BY MOUTH ONCE DAILY 90 tablet 3  . spironolactone (ALDACTONE) 25 MG tablet Take 0.5 tablets (12.5 mg total) by mouth daily. 45 tablet 3  . ticagrelor (BRILINTA) 60 MG TABS tablet TAKE 1 TABLET(60 MG) BY MOUTH TWICE DAILY 180 tablet 2  . torsemide (DEMADEX) 20 MG tablet Take 57m in the AM and 622min the PM daily. 240 tablet 6  . triamcinolone cream (KENALOG) 0.5 % Apply 1 application topically daily as needed (inflamation).     . Marland KitchenLORIC 40 MG tablet Take 40 mg by mouth at bedtime.   11[place] Name: So Clean CPAP cleaning system 1 each 0  . vitamin C (ASCORBIC ACID) 500 MG tablet Take 500 mg by mouth 3 (three) times a week.     No current facility-administered medications for this visit.      Past Medical History:  Diagnosis Date  . Anxiety   . Aortic stenosis    a. bicuspid aortic valve; mean gradient of 20 mmHg in 2/10; b. s/p bioprosth AVR (19-mm Edwards pericardial valve) with a redo coronary artery bypass graft procedure in 07/2009; postoperative Dressler's syndrome    . Arthritis   . Breast cancer  ([facility]  . Carcinoma, renal cell ([facility]CD[facility]11/2008   Laparoscopic right nephrectomy  . Chromophilic renal cell carcinoma (HCH[facility]  . Chronic diastolic CHF (congestive heart failure) ([facility]CD[place]   a. 9.2014 EchoP EF 60-65%, no rwma, bioprosth AVR, mean gradient of 1543m, mild to mod MR, PASP 3m10m  . CKD (chronic kidney disease), stage II   . Colitis, ischemic (HCC)[place]08  . Coronary artery disease    a. initial CABG with SVG-RCA in 12/05. b. redo SVG-RCA and bioprosthetic AVR in 1/11. d. 9/14 - PCI with DES to distal LM/proximal LAD was done rather than bypass as she would have been a poor candidate for redo sternotomy. d. S/p DES to prox LM 05/2014.  . Diabetes mellitus 03/2010    TYPE II: Hemoglobin A1c of 7.4 in  03/2010; 8.4 in 06/2010; treated with insulin pump  . Duodenal ulcer    Remote; H. pylori positive  . Gastroparesis due to DM (HCC)[place]/04/2012  . Genetic testing 01/14/2017   Ms. Kaminski underwent genetic counseling and testing for hereditary cancer syndromes on 01/01/2017. Her results were negative for mutations in all 83 genes analyzed by [facility]'s 83-gene Common Hereditary Cancers Panel. Genes analyzed include: ALK, APC, ATM, AXIN2, BAP1, BARD1, BLM, BMPR1A, BRCA1, BRCA2, BRIP1, CASR, CDC73, CDH1, CDK4, CDKN1B, CDKN1C, CDKN2A, CEBPA, CHEK2, CTNNA1, DICER1, DIS3L2,   . Hodgkin's disease (HCC)[place]91   [place]le radiation therapy  . Hodgkin's disease (HCC)[place]. Hyperlipidemia   . Hyperplastic gastric polyp 06/23/2012  . Hypertension   . Iron deficiency anemia    a. 03/2013 EGD: essentially normal. Possible slow GIB.  . Migraines   . NASH (nonalcoholic steatohepatitis) 1999   -biopsy in 1999  . OSA on CPAP 04/24/2017   Moderate with AHI 22/hr now on CPAP at 18cm H2o  . Osteopenia     hip on DEXA in October 2007.  . Osteoporosis   . Oxygen deficiency   . Pacemaker    six sinus syndrome  . PAF (paroxysmal atrial fibrillation) (HCC)    a.  h/o brief episodes noted  on ppm interrogation. b.  given GI bleeding and need to be on both ASA 81 and Brilinta, risks likely outweigh benefits of anticoagulation.   . Small bowel obstruction (HCC)    Recurrent; resolved after resection of a lipoma  . Syncope    a. H/o recurrent syncope with pauses on loop recorder s/p Medtronic pacemaker 07/2012.    ROS:   All systems reviewed and negative except as noted in the HPI.   Past Surgical History:  Procedure Laterality Date  . ABDOMINAL HYSTERECTOMY    . AORTIC VALVE REPLACEMENT  2011   Aortic valve replacement surgery with a 19 mm bioprosthetic device post redo CABG January 2011.  Marland Kitchen BONE MARROW BIOPSY    . BREAST EXCISIONAL BIOPSY     benign, bilateral  . CARDIAC CATHETERIZATION    . CERVICAL BIOPSY    . COLONOSCOPY     Multiple with adenomatous polyps  . COLONOSCOPY  06/23/2012   Procedure: COLONOSCOPY;  Surgeon: Gatha Mayer, MD;  Location: [facility] ENDOSCOPY;  Service: Endoscopy;  Laterality: N/A;  . CORONARY ARTERY BYPASS GRAFT  2005, 2011   CABG-most recent SVG to RCA-12/05; RCA occluded with patent graft in 2006; Redo bypass in 07/2009  . ESOPHAGOGASTRODUODENOSCOPY    . ESOPHAGOGASTRODUODENOSCOPY  06/23/2012   Procedure: ESOPHAGOGASTRODUODENOSCOPY (EGD);  Surgeon: Gatha Mayer, MD;  Location: [facility];  Service: Endoscopy;  Laterality: N/A;  . ESOPHAGOGASTRODUODENOSCOPY N/A 04/06/2013   Procedure: ESOPHAGOGASTRODUODENOSCOPY (EGD);  Surgeon: Irene Shipper, MD;  Location: [facility] ENDOSCOPY;  Service: Endoscopy;  Laterality: N/A;  . EXPLORATORY LAPAROTOMY W/ BOWEL RESECTION     Small bowel resection of lipoma  . GIVENS CAPSULE STUDY N/A 04/30/2013   Procedure: GIVENS CAPSULE STUDY;  Surgeon: Gatha Mayer, MD;  Location: [place];  Service: Endoscopy;  Laterality: N/A;  . LEFT AND RIGHT HEART CATHETERIZATION WITH CORONARY ANGIOGRAM N/A 04/07/2013   Procedure: LEFT AND RIGHT HEART CATHETERIZATION WITH CORONARY ANGIOGRAM;  Surgeon: Larey Dresser, MD;  Location: [facility] CATH LAB;  Service:  Cardiovascular;  Laterality: N/A;  . LEFT ATRIAL APPENDAGE OCCLUSION N/A 02/16/2016   Watchman not placed - nickel allergy  . LEFT HEART CATHETERIZATION WITH CORONARY/GRAFT ANGIOGRAM N/A 06/07/2014   Procedure: LEFT HEART CATHETERIZATION WITH Beatrix Fetters;  Surgeon: Burnell Blanks, MD;  Location: [facility] CATH LAB;  Service: Cardiovascular;  Laterality: N/A;  . LIVER BIOPSY    . LOOP RECORDER IMPLANT N/A 12/10/2011   Procedure: LOOP RECORDER IMPLANT;  Surgeon: Evans Lance, MD;  Location[facility] C[facility] LAB;  Service: Cardiovascular;  Laterality: N/A;  . MALONEY DILATION  06/23/2012   Procedure: [name] DILATION;  Surgeon: Gatha Mayer, MD;  Location: [facility] ENDOSCOPY;  Service: Endoscopy;;  54 fr  . MASTECTOMY Left 05/17/2015   total   . NEPHRECTOMY  2008   Laparoscopic right nephrectomy, renal cell carcinoma  . PACEMAKER INSERTION  08/21/2012   Medtronic Adapta L dual-chamber pacemaker  . PERCUTANEOUS CORONARY STENT INTERVENTION (PCI-S) N/A 04/09/2013   Procedure: PERCUTANEOUS CORONARY STENT INTERVENTION (PCI-S);  Surgeon: Burnell Blanks, MD;  Location: [facility] CATH LAB;  Service: Cardiovascular;  Laterality: N/A;  . PERMANENT PACEMAKER INSERTION N/A 08/21/2012   Procedure: PERMANENT PACEMAKER INSERTION;  Surgeon: Evans Lance, MD;  [facility] C[facility] LAB;  Service: Cardiovascular;  Laterality: N/A;  . RIGHT HEART CATH AND CORONARY/GRAFT ANGIOGRAPHY N/A 04/19/2017   Procedure: RIGHT HEART CATH AND CORONARY/GRAFT ANGIOGRAPHY;  Surgeon: Larey Dresser, MD;  Location: [place] CV LAB;  Service: Cardiovascular;  Laterality: N/A;  . TEE WITHOUT CARDIOVERSION N/A 12/07/2015   Procedure: TRANSESOPHAGEAL ECHOCARDIOGRAM (TEE);  Surgeon: Larey Dresser, MD;  Location: [place];  Service: Cardiovascular;  Laterality: N/A;  . TOTAL ABDOMINAL HYSTERECTOMY W/ BILATERAL SALPINGOOPHORECTOMY  2000   endometriosis and ovarian cysts  . TOTAL MASTECTOMY Left 05/17/2015   Procedure: LEFT TOTAL  MASTECTOMY;  Surgeon: Rolm Bookbinder, MD;  Location: [place];  Service: General;  Laterality: Left;  . TUMOR EXCISION  1981   removal of Hodgkins lymphoma     Family History  Problem Relation Age of Onset  . Heart attack Mother        d.61  . Heart disease Mother   . Heart attack Father        d.60  . Diabetes Father   . Heart disease Father   . Lung cancer Sister        d.61 metastases to brain. History of smoking. Maternal half-sister.  . Cancer Sister 30       unspecified gynecologic cancer (either cervical or ovarian) in her 46s.  Marland Kitchen Heart attack Maternal Grandmother   . Cancer Maternal Grandfather 71       d.52s oral cancer  . Hypertension Brother   . Glaucoma Brother   . Breast cancer Sister 59       d.42 from metastatic disease. Paternal half-sister.  . Colon cancer Neg Hx   . Stomach cancer Neg Hx      Social History   Socioeconomic History  . Marital status: Married    Spouse name: Not on file  . Number of children: 2  . Years of education: Not on file  . Highest education level: Not on file  Occupational History  . Occupation: retired    Comment: elementary principal  Social Needs  . Financial resource strain: Not on file  . Food insecurity:    Worry: Not on file    Inability: Not on file  . Transportation needs:    Medical: Not on file    Non-medical: Not on file  Tobacco Use  . Smoking status: Never Smoker  . Smokeless tobacco: Never Used  Substance and Sexual Activity  . Alcohol use: No    Alcohol/week: 0.0 oz  . Drug use: No  . Sexual activity: Yes  Lifestyle  . Physical activity:    Days per week: Not on file    Minutes per session: Not on file  . Stress: Not on file  Relationships  . Social connections:    Talks on phone: Not on file    Gets together: Not on file    Attends religious service: Not on file    Active member of club or organization: Not on file    Attends meetings of clubs or organizations: Not on file    Relationship  status: Not on file  . Intimate partner violence:    Fear of current or ex partner: Not on file    Emotionally abused: Not on file    Physically abused: Not on file    Forced sexual activity: Not on file  Other Topics Concern  . 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 Has a son, Aaron Edelman, who is Retail buyer.   Eats all food groups.   Wear seatbelt.    Attends church. [place], [place]. [place].  BP 118/64   Pulse 73   Ht 5' 2.5" (1.588 m)   Wt 118 lb 12.8 oz (53.9 kg)   SpO2 99% Comment: on room air  BMI 21.38 kg/m   Physical Exam:  Well appearing 67 year old woman, NAD HEENT: Unremarkable Neck:  No JVD, no thyromegally Lymphatics:  No adenopathy Back:  No CVA tenderness Lungs:  Clear, with no wheezes, rales, or rhonchi. HEART:  Regular rate rhythm, no murmurs, no rubs, no clicks, there is a fairly prominent opening snap. Abd:  soft, positive bowel sounds, no organomegally, no rebound, no guarding Ext:  2 plus pulses, no edema, no cyanosis, no clubbing Skin:  No rashes no nodules Neuro:  CN II through XII intact, motor grossly intact    DEVICE  Normal device function.  See PaceArt for details.   Assess/Plan: 1.  Sinus node dysfunction -she is asymptomatic status post pacemaker insertion and pacing minimally. 2.  Paroxysmal atrial fibrillation -she is out of rhythm less than 1% of the time.  She will continue her current medical therapy, and if she develops a rapid ventricular response in A. fib, I have encouraged her to take an extra metoprolol. 3.  Pacemaker -her Medtronic dual-chamber pacemaker is working normally.  We will recheck in several months. 4.  Coronary artery disease -she has been worked up by Dr. Aundra Dubin.  She has class II symptoms.  She will take her beta-blocker and nitroglycerin as needed.  [name], MD

## 2017-11-24 ENCOUNTER — Encounter: Payer: Self-pay | Admitting: Internal Medicine

## 2017-11-25 ENCOUNTER — Encounter (HOSPITAL_COMMUNITY): Payer: Self-pay

## 2017-11-25 ENCOUNTER — Other Ambulatory Visit: Payer: Self-pay | Admitting: Cardiology

## 2017-11-28 ENCOUNTER — Telehealth: Payer: Self-pay

## 2017-11-28 ENCOUNTER — Encounter: Payer: Self-pay | Admitting: Family Medicine

## 2017-11-28 NOTE — Telephone Encounter (Signed)
Caseman[facility] nurse from [facility] called and connected me with [name] and wanted me to relay to you that for 3 days she has had a barking cough that is non productive and a low grade fever around 99. Clear sinus drainage and occasional chills. The coughing gets so bad sometimes that its hard to stop and she is sore all in her ribs and back from it. Wanted to know if you would see her or call her in something (or recommend something since she does have heart failure and is scared to take an OTC meds)

## 2017-11-28 NOTE — Telephone Encounter (Signed)
Scheduled patient to be seen at 10am tomorrow.

## 2017-11-28 NOTE — Telephone Encounter (Signed)
Needs to be seen and evaluated.

## 2017-11-29 ENCOUNTER — Encounter: Payer: Self-pay | Admitting: Family Medicine

## 2017-11-29 ENCOUNTER — Ambulatory Visit (HOSPITAL_COMMUNITY)
Admission: RE | Admit: 2017-11-29 | Discharge: 2017-11-29 | Disposition: A | Discharge status: Home / Self Care | Payer: Medicare Other | Source: Ambulatory Visit | Attending: Family Medicine | Admitting: Family Medicine

## 2017-11-29 ENCOUNTER — Ambulatory Visit: Payer: Medicare Other | Admitting: Family Medicine

## 2017-11-29 ENCOUNTER — Other Ambulatory Visit: Payer: Self-pay

## 2017-11-29 VITALS — BP 122/64 | HR 79 | Temp 99.0°F | Resp 18 | Ht 63.0 in | Wt 116.0 lb

## 2017-11-29 DIAGNOSIS — Z950 Presence of cardiac pacemaker: Secondary | ICD-10-CM | POA: Insufficient documentation

## 2017-11-29 DIAGNOSIS — M1A9XX0 Chronic gout, unspecified, without tophus (tophi): Secondary | ICD-10-CM | POA: Diagnosis not present

## 2017-11-29 DIAGNOSIS — J209 Acute bronchitis, unspecified: Secondary | ICD-10-CM

## 2017-11-29 DIAGNOSIS — Z952 Presence of prosthetic heart valve: Secondary | ICD-10-CM | POA: Insufficient documentation

## 2017-11-29 MED ORDER — BENZONATATE 100 MG PO CAPS: ORAL_CAPSULE | ORAL | 0 refills | Status: DC

## 2017-11-29 MED ORDER — ULORIC 40 MG PO TABS: 40.0000 mg | ORAL_TABLET | Freq: Every day | ORAL | 3 refills | Status: DC

## 2017-11-29 MED ORDER — AZITHROMYCIN 250 MG PO TABS: ORAL_TABLET | ORAL | 0 refills | Status: DC

## 2017-11-29 NOTE — Progress Notes (Signed)
Patient ID: [name] [name], female    DOB: 1950/10/25, 67 y.o.   MRN: [id number]  Chief Complaint  Patient presents with  . Cough    Allergies Avandia [rosiglitazone maleate]; Cephalexin; Clindamycin hcl; Lincomycin; Metformin; Novolog [insulin aspart]; Penicillins; Tizanidine; Adhesive [tape]; Atorvastatin; Cholestyramine; Crestor [rosuvastatin]; Gentamycin [gentamicin]; Humalog [insulin lispro]; Iodinated diagnostic agents; Limonene; Pravastatin; Prednisone; Ranexa [ranolazine]; Simvastatin; Strawberry extract; Doxycycline; Plavix [clopidogrel bisulfate]; Codeine; Erythromycin; Hydrocodone-acetaminophen; Latex; Neomycin; Nickel; Ranitidine; Tamiflu [oseltamivir]; and Tramadol  Subjective:   [name] [name] is a 67 y.o. female who presents to [facility] today.  HPI [name] [name] presents today with a chief complaint of a cough.  She reports that she has been coughing for 5 to 6 days.  She initially thought pollen and allergies was the cause of her cough but cough has increasingly worsened. Feels chest congestion. Hard to get the sputum up with coughing but is managing to cough up some yellowish/green sputum.  Reports that she feels more fatigue and low energy.  With the coughing can feel winded.  Reports that her chest and back are  sore from coughing so much.  She denies any fevers.  However, she does report that her temperatures are running about 1-1/2 degree higher than normal.  She denies any vomiting.  She reports she has felt a little bit nausea.  Bowel movements are normal.  Denies any dysuria.  Does report that she needs a refill on her Uloric.  She reports that she usually gets this from her nephrologist but she is due to see Dr. Lorrene Reid soon and they asked her to get it from her PCP.  She has been on this medication for quite some time due to chronic gout.  She denies any gout flares or pain in her joints or extremities at this time.  She reports that she has had some  issues with right-sided pleural effusion in the past and this is been followed by her specialist.  She reports she has had no history of asthma.  She reports that this is not a cough like she has had in the past when she has had heart failure exacerbations.  She denies any change in her lower extremity edema.  She does not feel like she is retaining fluid.  Reports she has had some mild sinus pain and pressure.  Her teeth are not hurting.  She denies any headache.  She reports some occasional sore throat that is starting today.  She reports she has had a history of pneumonia and is very concerned that this is going to turn into pneumonia if not treated and evaluated.  Nothing seems to make the cough better or worse.  She seems to feel like her symptoms are getting worse which is why she comes in today.   Past Medical History:  Diagnosis Date  . Anxiety   . Aortic stenosis    a. bicuspid aortic valve; mean gradient of 20 mmHg in 2/10; b. s/p bioprosth AVR (19-mm Edwards pericardial valve) with a redo coronary artery bypass graft procedure in 07/2009; postoperative Dressler's syndrome    . Arthritis   . Breast cancer ([place])   . Carcinoma, renal cell ([place]) 05/2007   Laparoscopic right nephrectomy  . 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 Chronic diastolic CHF (congestive heart failure) ([place])    a. 9.2014 EchoP EF 60-65%, no rwma, bioprosth AVR, mean gradient of 66mHg, mild to mod MR, PASP 527mg.  . CKD (chronic kidney disease),  stage II   . Colitis, ischemic ([place]) 2008  . Coronary artery disease    a. initial CABG with SVG-RCA in 12/05. b. redo SVG-RCA and bioprosthetic AVR in 1/11. d. 9/14 - PCI with DES to distal LM/proximal LAD was done rather than bypass as she would have been a poor candidate for redo sternotomy. d. S/p DES to prox LM 05/2014.  . Diabetes mellitus 03/2010    TYPE II: Hemoglobin A1c of 7.4 in  03/2010; 8.4 in 06/2010; treated with insulin pump  . Duodenal ulcer    Remote;  H. pylori positive  . Gastroparesis due to DM ([place]) 05/01/2012  . Genetic testing 01/14/2017   [name] underwent genetic counseling and testing for hereditary cancer syndromes on 01/01/2017. Her results were negative for mutations in all 83 genes analyzed by [facility]'s 83-gene Common Hereditary Cancers Panel. Genes analyzed include: ALK, APC, ATM, AXIN2, BAP1, BARD1, BLM, BMPR1A, BRCA1, BRCA2, BRIP1, CASR, CDC73, CDH1, CDK4, CDKN1B, CDKN1C, CDKN2A, CEBPA, CHEK2, CTNNA1, DICER1, DIS3L2,   . Hodgkin's disease ([place]) 1991   Mantle radiation therapy  . Hodgkin's disease ([place])   . Hyperlipidemia   . Hyperplastic gastric polyp 06/23/2012  . Hypertension   . Iron deficiency anemia    a. 03/2013 EGD: essentially normal. Possible slow GIB.  . Migraines   . NASH (nonalcoholic steatohepatitis) 1999   -biopsy in 1999  . OSA on CPAP 04/24/2017   Moderate with AHI 22/hr now on CPAP at 18cm H2o  . Osteopenia     hip on DEXA in October 2007.  . Osteoporosis   . Oxygen deficiency   . Pacemaker    six sinus syndrome  . PAF (paroxysmal atrial fibrillation) ([place])    a.  h/o brief episodes noted on ppm interrogation. b. given GI bleeding and need to be on both ASA 81 and Brilinta, risks likely outweigh benefits of anticoagulation.   . Small bowel obstruction ([facility])    Recurrent; resolved after resection of a lipoma  . Syncope    a. H/o recurrent syncope with pauses on loop recorder s/p Medtronic pacemaker 07/2012.    Past Surgical History:  Procedure Laterality Date  . ABDOMINAL HYSTERECTOMY    . AORTIC VALVE REPLACEMENT  2011   Aortic valve replacement surgery with a 19 mm bioprosthetic device post redo CABG January 2011.  Marland Kitchen BONE MARROW BIOPSY    . BREAST EXCISIONAL BIOPSY     benign, bilateral  . CARDIAC CATHETERIZATION    . CERVICAL BIOPSY    . COLONOSCOPY     Multiple with adenomatous polyps  . COLONOSCOPY  06/23/2012   Procedure: COLONOSCOPY;  Surgeon: Gatha Mayer, MD;  Location: [facility]  ENDOSCOPY;  Service: Endoscopy;  Laterality: N/A;  . CORONARY ARTERY BYPASS GRAFT  2005, 2011   CABG-most recent SVG to RCA-12/05; RCA occluded with patent graft in 2006; Redo bypass in 07/2009  . ESOPHAGOGASTRODUODENOSCOPY    . ESOPHAGOGASTRODUODENOSCOPY  06/23/2012   Procedure: ESOPHAGOGASTRODUODENOSCOPY (EGD);  Surgeon: Gatha Mayer, MD;  Location: [facility] ENDOS[facility];  Service: Endoscopy;  Laterality: N/A;  . ESOPHAGOGASTRODUODENOSCOPY N/A 04/06/2013   Procedure: ESOPHAGOGASTRODUODENOSCOPY (EGD);  Surgeon: Irene Shipper, MD;  Location: [facility] ENDOSCOPY;  Service: Endoscopy;  Laterality: N/A;  . EXPLORATORY LAPAROTOMY W/ BOWEL RESECTION     Small bowel resection of lipoma  . GIVENS CAPSULE STUDY N/A 04/30/2013   Procedure: GIVENS CAPSULE STUDY;  Surgeon: Gatha Mayer, MD;  Location: [place];  Service: Endoscopy;  Laterality: N/A;  . LEFT AND  RIGHT HEART CATHETERIZATION WITH CORONARY ANGIOGRAM N/A 04/07/2013   Procedure: LEFT AND RIGHT HEART CATHETERIZATION WITH CORONARY ANGIOGRAM;  Surgeon: Larey Dresser, MD;  Location: [facility] CATH LAB;  Service: Cardiovascular;  Laterality: N/A;  . LEFT ATRIAL APPENDAGE OCCLUSION N/A 02/16/2016   Watchman not placed - nickel allergy  . LEFT HEART CATHETERIZATION WITH CORONARY/GRAFT ANGIOGRAM N/A 06/07/2014   Procedure: LEFT HEART CATHETERIZATION WITH Beatrix Fetters;  Surgeon: Burnell Blanks, MD;  Location: [facility] CATH LAB;  Service: Cardiovascular;  Laterality: N/A;  . LIVER BIOPSY    . LOOP RECORDER IMPLANT N/A 12/10/2011   Procedure: LOOP RECORDER IMPLANT;  Surgeon: Evans Lance, MD;  Location: [facility] CATH LAB;  Service: Cardiovascular;  Laterality: N/A;  . MALONEY DILATION  06/23/2012   Procedure: [name] DILATION;  Surgeon: Gatha Mayer, MD;  Location: [facility] ENDOSCOPY;  Service: Endoscopy;;  54 fr  . MASTECTOMY Left 05/17/2015   total   . NEPHRECTOMY  2008   Laparoscopic right nephrectomy, renal cell carcinoma  . PACEMAKER INSERTION  08/21/2012    Medtronic Adapta L dual-chamber pacemaker  . PERCUTANEOUS CORONARY STENT INTERVENTION (PCI-S) N/A 04/09/2013   Procedure: PERCUTANEOUS CORONARY STENT INTERVENTION (PCI-S);  Surgeon: Burnell Blanks, MD;  Location: [facility] CATH LAB;  Service: Cardiovascular;  Laterality: N/A;  . PERMANENT PACEMAKER INSERTION N/A 08/21/2012   Procedure: PERMANENT PACEMAKER INSERTION;  Surgeon: Evans Lance, MD;  Location: [facility] CATH LAB;  Service: Cardiovascular;  Laterality: N/A;  . RIGHT HEART CATH AND CORONARY/GRAFT ANGIOGRAPHY N/A 04/19/2017   Procedure: RIGHT HEART CATH AND CORONARY/GRAFT ANGIOGRAPHY;  Surgeon: Larey Dresser, MD;  Location: [facility] CV LAB;  Service: Cardiovascular;  Laterality: N/A;  . TEE WITHOUT CARDIOVERSION N/A 12/07/2015   Procedure: TRANSESOPHAGEAL ECHOCARDIOGRAM (TEE);  Surgeon: Larey Dresser, MD;  Location: [place];  Service: Cardiovascular;  Laterality: N/A;  . TOTAL ABDOMINAL HYSTERECTOMY W/ BILATERAL SALPINGOOPHORECTOMY  2000   endometriosis and ovarian cysts  . TOTAL MASTECTOMY Left 05/17/2015   Procedure: LEFT TOTAL MASTECTOMY;  Surgeon: Rolm Bookbinder, MD;  Location: [place];  Service: General;  Laterality: Left;  . TUMOR EXCISION  1981   removal of Hodgkins lymphoma    Family History  Problem Relation Age of Onset  . Heart attack Mother        d.61  . Heart disease Mother   . Heart attack Father        d.60  . Diabetes Father   . Heart disease Father   . Lung cancer Sister        d.61 metastases to brain. History of smoking. Maternal half-sister.  . Cancer Sister 30       unspecified gynecologic cancer (either cervical or ovarian) in her 65s.  Marland Kitchen Heart attack Maternal Grandmother   . Cancer Maternal Grandfather 66       d.52s oral cancer  . Hypertension Brother   . Glaucoma Brother   . Breast cancer Sister 69       d.42 from metastatic disease. Paternal half-sister.  . Colon cancer Neg Hx   . Stomach cancer Neg Hx      Social History    Socioeconomic History  . Marital status: Married    Spouse name: Not on file  . Number of children: 2  . Years of education: Not on file  . Highest education level: Not on file  Occupational History  . Occupation: retired    Comment: elementary principal  Social Needs  . Financial resource strain: Not  on file  . Food insecurity:    Worry: Not on file    Inability: Not on file  . Transportation needs:    Medical: Not on file    Non-medical: Not on file  Tobacco Use  . Smoking status: Never Smoker  . Smokeless tobacco: Never Used  Substance and Sexual Activity  . Alcohol use: No    Alcohol/week: 0.0 oz  . Drug use: No  . Sexual activity: Yes  Lifestyle  . Physical activity:    Days per week: Not on file    Minutes per session: Not on file  . Stress: Not on file  Relationships  . Social connections:    Talks on phone: Not on file    Gets together: Not on file    Attends religious service: Not on file    Active member of club or organization: Not on file    Attends meetings of clubs or organizations: Not on file    Relationship status: Not on file  Other Topics Concern  . 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 Has a son, Aaron Edelman, who is Retail buyer.   Eats all food groups.   Wear seatbelt.    Attends church. [place], [place]. [employer].     Review of Systems  Constitutional: Positive for appetite change and fatigue. Negative for activity change and fever.  HENT: Negative for congestion, dental problem, ear discharge, ear pain, facial swelling, sneezing, trouble swallowing and voice change.   Eyes: Negative for visual disturbance.  Respiratory: Positive for cough. Negative for chest tightness, shortness of breath and wheezing.   Cardiovascular: Negative for chest pain, palpitations and leg swelling.  Gastrointestinal: Positive for  nausea. Negative for abdominal pain, blood in stool, constipation, diarrhea and vomiting.  Genitourinary: Negative for dysuria, frequency and urgency.  Musculoskeletal: Negative for back pain.  Neurological: Negative for dizziness, syncope, light-headedness and numbness.  Hematological: Negative for adenopathy.  Psychiatric/Behavioral: Negative for dysphoric mood.     Objective:   BP 122/64 (BP Location: Left Arm, Patient Position: Sitting, Cuff Size: Normal)   Pulse 79   Temp 99 F (37.2 C) (Oral)   Resp 18   Ht _0  (1.6 m)   Wt 116 lb (52.6 kg)   SpO2 98%   BMI 20.55 kg/m   Physical Exam  Constitutional: She is oriented to person, place, and time. She appears well-developed and well-nourished.  HENT:  Head: Normocephalic and atraumatic.  Nose: Nose normal.  Mouth/Throat: Oropharynx is clear and moist. No oropharyngeal exudate.  Eyes: Pupils are equal, round, and reactive to light. Conjunctivae and EOM are normal. No scleral icterus.  Neck: Normal range of motion. No JVD present. No tracheal deviation present. No thyromegaly present.  Cardiovascular: Normal rate and regular rhythm.  Pulmonary/Chest: Effort normal and breath sounds normal. No stridor. No respiratory distress. She has no wheezes. She has no rales.  Patient does have bilateral breath sounds present but breath sounds slightly decreased on the right side compared to the left.  No wheezing on examination.  Abdominal: Soft. Bowel sounds are normal.  Musculoskeletal: Normal range of motion. She exhibits no edema.  Lymphadenopathy:    She has no cervical adenopathy.  Neurological: She is alert and oriented to person, place, and time. No cranial nerve deficit.  Skin: Skin is warm and dry. Capillary  refill takes less than 2 seconds. No rash noted.  Psychiatric: She has a normal mood and affect. Her behavior is normal. Judgment and thought content normal.  Vitals reviewed.    Assessment and Plan  1. Acute  bronchitis, unspecified organism Secondary to patient's history of pneumonia and her heart failure.  We will go ahead and check chest x-ray at this time.  We did discuss worrisome signs and symptoms of worsening infection.  She will call or go to the emergency department if she gets any fevers, chills, shortness of breath or dyspnea on exertion.  She will call with any questions or concerns.  She is taken Zithromax in the past without any side effects or problems.  She is allergic to multiple medications.  She is also use the [employer] without problems.  She will avoid using any over-the-counter medications due to her hypertension.  She was encouraged to continue good fluid intake and supportive care.  She will call with any questions or concerns. - benzonatate (TESSALON) 100 MG capsule; Take 1-2 pills three time a day as needed for cough  Dispense: 40 capsule; Refill: 0 - azithromycin (ZITHROMAX) 250 MG tablet; Take two pills today at the same time and one pill a day for the next four days  Dispense: 6 tablet; Refill: 0 - DG Chest 2 View; Future  2. Chronic gout without tophus, unspecified cause, unspecified site Feel medication.  She will keep her regularly scheduled visit with nephrology and urology. - ULORIC 40 MG tablet; Take 1 tablet (40 mg total) by mouth at bedtime.  Dispense: 90 tablet; Refill: 3  Return if symptoms worsen or fail to improve, for Regularly scheduled appointment.[name], MD 11/29/2017

## 2017-11-29 NOTE — Progress Notes (Signed)
Patient advised of results with verbal understanding.

## 2017-12-06 ENCOUNTER — Encounter: Payer: Self-pay | Admitting: Family Medicine

## 2017-12-12 ENCOUNTER — Encounter: Payer: Self-pay | Admitting: Family Medicine

## 2017-12-12 ENCOUNTER — Other Ambulatory Visit: Payer: Self-pay | Admitting: Hematology and Oncology

## 2017-12-12 DIAGNOSIS — D509 Iron deficiency anemia, unspecified: Secondary | ICD-10-CM

## 2017-12-12 DIAGNOSIS — D649 Anemia, unspecified: Secondary | ICD-10-CM

## 2017-12-12 MED ORDER — HEMATINIC PLUS COMPLEX 106-1 MG PO TABS: ORAL_TABLET | ORAL | 3 refills | Status: DC

## 2017-12-26 ENCOUNTER — Telehealth (HOSPITAL_COMMUNITY): Payer: Self-pay

## 2017-12-26 ENCOUNTER — Encounter: Payer: Self-pay | Admitting: Family Medicine

## 2017-12-26 NOTE — Telephone Encounter (Signed)
I think it would be ok during the day.

## 2017-12-26 NOTE — Telephone Encounter (Signed)
Patient calling CHF clinic triage line seeking clearance to fly to [facility] during the day. States she previously flew at night and had difficulty breathing as the air was turned down in the cabin to help facilitate sleep. She does state she has arranged for a day time flight and would like Dr. Claris Gladden clearance on this. Does has appt with Dr. Aundra Dubin next week and is aware she may have to wait until this appt to be cleared before booking flight. Will forward to Dr. Aundra Dubin to advise.  Renee Pain, RN

## 2017-12-27 ENCOUNTER — Encounter: Payer: Self-pay | Admitting: Family Medicine

## 2017-12-27 NOTE — Telephone Encounter (Signed)
Patient aware and appreciative.

## 2018-01-03 ENCOUNTER — Ambulatory Visit (HOSPITAL_COMMUNITY)
Admission: RE | Admit: 2018-01-03 | Discharge: 2018-01-03 | Disposition: A | Discharge status: Home / Self Care | Payer: Medicare Other | Source: Ambulatory Visit | Attending: Cardiology | Admitting: Cardiology

## 2018-01-03 VITALS — BP 122/62 | HR 77 | Wt 117.2 lb

## 2018-01-03 DIAGNOSIS — I480 Paroxysmal atrial fibrillation: Secondary | ICD-10-CM

## 2018-01-03 DIAGNOSIS — Z91048 Other nonmedicinal substance allergy status: Secondary | ICD-10-CM | POA: Diagnosis not present

## 2018-01-03 DIAGNOSIS — Z9013 Acquired absence of bilateral breasts and nipples: Secondary | ICD-10-CM | POA: Insufficient documentation

## 2018-01-03 DIAGNOSIS — G4733 Obstructive sleep apnea (adult) (pediatric): Secondary | ICD-10-CM | POA: Diagnosis not present

## 2018-01-03 DIAGNOSIS — I495 Sick sinus syndrome: Secondary | ICD-10-CM | POA: Diagnosis not present

## 2018-01-03 DIAGNOSIS — N183 Chronic kidney disease, stage 3 unspecified: Secondary | ICD-10-CM

## 2018-01-03 DIAGNOSIS — Z953 Presence of xenogenic heart valve: Secondary | ICD-10-CM | POA: Diagnosis not present

## 2018-01-03 DIAGNOSIS — E785 Hyperlipidemia, unspecified: Secondary | ICD-10-CM

## 2018-01-03 DIAGNOSIS — D649 Anemia, unspecified: Secondary | ICD-10-CM | POA: Diagnosis not present

## 2018-01-03 DIAGNOSIS — D696 Thrombocytopenia, unspecified: Secondary | ICD-10-CM | POA: Insufficient documentation

## 2018-01-03 DIAGNOSIS — I25110 Atherosclerotic heart disease of native coronary artery with unstable angina pectoris: Secondary | ICD-10-CM | POA: Diagnosis not present

## 2018-01-03 DIAGNOSIS — Z7982 Long term (current) use of aspirin: Secondary | ICD-10-CM | POA: Insufficient documentation

## 2018-01-03 DIAGNOSIS — R0989 Other specified symptoms and signs involving the circulatory and respiratory systems: Secondary | ICD-10-CM | POA: Insufficient documentation

## 2018-01-03 DIAGNOSIS — Z905 Acquired absence of kidney: Secondary | ICD-10-CM | POA: Diagnosis not present

## 2018-01-03 DIAGNOSIS — Z950 Presence of cardiac pacemaker: Secondary | ICD-10-CM | POA: Insufficient documentation

## 2018-01-03 DIAGNOSIS — I5032 Chronic diastolic (congestive) heart failure: Secondary | ICD-10-CM

## 2018-01-03 DIAGNOSIS — I130 Hypertensive heart and chronic kidney disease with heart failure and stage 1 through stage 4 chronic kidney disease, or unspecified chronic kidney disease: Secondary | ICD-10-CM | POA: Insufficient documentation

## 2018-01-03 DIAGNOSIS — Z951 Presence of aortocoronary bypass graft: Secondary | ICD-10-CM | POA: Diagnosis not present

## 2018-01-03 DIAGNOSIS — E1022 Type 1 diabetes mellitus with diabetic chronic kidney disease: Secondary | ICD-10-CM | POA: Diagnosis not present

## 2018-01-03 DIAGNOSIS — I208 Other forms of angina pectoris: Secondary | ICD-10-CM

## 2018-01-03 DIAGNOSIS — Z8673 Personal history of transient ischemic attack (TIA), and cerebral infarction without residual deficits: Secondary | ICD-10-CM | POA: Diagnosis not present

## 2018-01-03 DIAGNOSIS — Z853 Personal history of malignant neoplasm of breast: Secondary | ICD-10-CM | POA: Diagnosis not present

## 2018-01-03 DIAGNOSIS — E1043 Type 1 diabetes mellitus with diabetic autonomic (poly)neuropathy: Secondary | ICD-10-CM | POA: Diagnosis not present

## 2018-01-03 DIAGNOSIS — Z794 Long term (current) use of insulin: Secondary | ICD-10-CM | POA: Insufficient documentation

## 2018-01-03 DIAGNOSIS — Z8571 Personal history of Hodgkin lymphoma: Secondary | ICD-10-CM | POA: Insufficient documentation

## 2018-01-03 DIAGNOSIS — Z8711 Personal history of peptic ulcer disease: Secondary | ICD-10-CM | POA: Diagnosis not present

## 2018-01-03 DIAGNOSIS — K3184 Gastroparesis: Secondary | ICD-10-CM | POA: Diagnosis not present

## 2018-01-03 DIAGNOSIS — Z79899 Other long term (current) drug therapy: Secondary | ICD-10-CM | POA: Insufficient documentation

## 2018-01-03 LAB — BASIC METABOLIC PANEL
ANION GAP: 10 (ref 5–15)
BUN: 53 mg/dL — AB (ref 6–20)
CALCIUM: 9.9 mg/dL (ref 8.9–10.3)
CO2: 27 mmol/L (ref 22–32)
Chloride: 100 mmol/L — ABNORMAL LOW (ref 101–111)
Creatinine, Ser: 1.6 mg/dL — ABNORMAL HIGH (ref 0.44–1.00)
GFR calc Af Amer: 38 mL/min — ABNORMAL LOW (ref >60)
GFR calc non Af Amer: 33 mL/min — ABNORMAL LOW (ref >60)
GLUCOSE: 206 mg/dL — AB (ref 65–99)
Potassium: 4.4 mmol/L (ref 3.5–5.1)
Sodium: 137 mmol/L (ref 135–145)

## 2018-01-03 LAB — LIPID PANEL
Cholesterol: 102 mg/dL (ref 0–200)
HDL: 42 mg/dL (ref >40)
LDL CALC: 8 mg/dL (ref 0–99)
Total CHOL/HDL Ratio: 2.4 RATIO
Triglycerides: 259 mg/dL — ABNORMAL HIGH (ref <150)
VLDL: 52 mg/dL — ABNORMAL HIGH (ref 0–40)

## 2018-01-03 LAB — MAGNESIUM: MAGNESIUM: 2.7 mg/dL — AB (ref 1.7–2.4)

## 2018-01-03 MED ORDER — TORSEMIDE 20 MG PO TABS: ORAL_TABLET | ORAL | 6 refills | Status: DC

## 2018-01-03 NOTE — Progress Notes (Signed)
Patient ID: VINNIE BOBST, female   DOB: [birth year], 67 y.o.   MRN: [id number] PCP: [name]  Cardiology: Dr. Aundra Dubin  67 yo with history of CAD s/p CABG in 12/05 and redo in 1/11, bioprosthetic AVR, diastolic CHF, and sick sinus syndrome s/p PCM presents for cardiology followup.  She had initial CABG with SVG-RCA in 12/05 followed by redo SVG-RCA and bioprosthetic AVR in 1/11.  She has had diastolic CHF and is on Lasix.  In 9/14, she had a CPX test.  This showed severe functional limitation with ischemic ECG changes (inferolateral ST depression and ST elevation in V1 and V2). She had chest pain.  She had R/LHC in 9/14.  This showed elevated left and right heart filling pressures, patent SVG-PDA, and 80% distal LM/80% ostial LAD stenosis.  She was not a candidate for redo CABG (had 2 prior sternotomies as well as chest radiation for Hodgkins lymphoma).  She had DES from left main into proximal LAD and will need long-term DAPT => Brilinta was used as she has been a poor responder to Plavix in the past.  She re-developed angina in 11/15 and was admitted with unstable angina.  LHC showed 80% ostial LM and patent SVG-RCA. She had DES to ostial LM. Echo in 1/18 showed EF 60-65%, normal bioprosthetic aortic valve, mild-moderate MR, PASP 35 mmHg.   She has chronic iron deficiency anemia thought to be due to a slow GI bleed.  Last EGD was unremarkable and a capsule endoscopy was also unremarkable.  She has had periodic transfusions; However, recently hemoglobin has been stable.    She had bilateral mastectomy for DCIS in 32/12 without complication.  She has an insulin pump followed by an endocrinologist at [facility].   Given myalgias with statins, she was started on Praluent.  LDL is now excellent.   In the past she has had short runs of atrial fibrillation.  She is not anticoagulated. Planned for Watchman placement, but given her nickel allergy, we were unable to place a Watchman.    Cardiolite in 2/18  showed no ischemia or infarction.     Diagnosed with OSA, now on CPAP.   Given ongoing problems with exertional dyspnea and occasional chest pain, Cardiolite was repeated in 9/18 and showed inferior changes.  She underwent left/right heart cath in 9/18.  This showed elevated filling pressures, R>L.  There was 75+% ostial LCx stenosis.  However, there were 2 layers of stent from the mid left main into the ostial LAD, and LCx intervention was thought to be very risky.  It was decided to manage her medically.  Echo was repeated in 10/18 showing EF 55-60%, moderate to severe TR.    Given recurrent chest pain, she had ETT-Cardiolite done in 3/19.  This was a normal study with no evidence for ischemia or infarction.   She returns today for follow up of CHF and CAD. Weight is down 3 lbs.  She is not swelling in her legs or abdomen today.  Breathing is overall better, not using metolazone.  She thinks that she could walk for two miles on level ground without stopping.  She has rare chest pain, now seems more musculoskeletal related to her mastectomy scar.  No PND or orthopnea.  She saw Dr Lovena Le recently, < 1% atrial fibrillation. Main complaint today is leg cramps at night.  These have been excruciating.  She does not have them during the day.  PCP checked K and Mg, these were normal.  She has  noticed that the cramps are much less pronounced if she takes her afternoon torsemide earlier in the day.  No exertional leg pain.   Labs (7/14): hemoglobin 11 Labs (9/14): K 3.9, creatinine 1.48, hemoglobin 7.3 Labs (10/14): hemoglobin 7.7 Labs (11/14): K 3.8, creatinine 1.5 Labs (12/14): HCT 37.7 Labs (1/15): LDL 106, HDL 41, K 3.9, creatinine 1.6, LFTs normal Labs (2/15): K 3.7, creatinine 1.1, BNP 71 Labs (3/15): HCT 36.7 Labs (4/15): K 3.7, creatinine 1.1, HCT 35.5 Labs (7/15): HCT 36.4 Labs (11/15): HCT 32.1, K 4.2, creatinine 1.06 Labs (12/15): hgb 12 Labs (1/16): HCT 37.1 Labs (3/16): LDL 25, HDL 42,  elevated transaminases Labs (6/16): HCT 39.6, plts 146 Labs (7/16): LDL 59, HDL 41, AST 34, ALT 49, AP 125 Labs (11/16): HCT 38.8, alkaline phosphatase 156, AST/ALT normal, K 4, creatinine 1.0 Labs (3/17): K 4.4, creatinine 1.4, LFTs normal, hgb 12.5 Labs (5/17): HCT 41.8, K 4, creatinine 1.3 Labs (9/17): K 4, creatinine 0.43, hgb 13.6 Labs (10/17): LDL 42, HDL 43 Labs (1/18): K 4.3, creatinine 1.1 => 1.19, AST 66, ALT 67, hgb 14.2, BNP 72 Labs (2/18): K 3.9, creatinine 1.2, BNP 49.6, HCT 42.1 Labs (4/18): hgb 13.8 Labs (5/18): HCT 41.7, plts 98, K 4.3, creatinine 1.11, BNP 180 Labs (7/18): hgb 12, plts 110, K 4, creatinine 1.23, AST 38, ALT 44 Labs (8/18): K 5, creatinine 1.17 => 2.1, BUN 33 => 92, BNP 134 Labs (9/18): K 3.6, creatinine 1.42 => 1.44. Hgb 14 => 13.5 Labs (10/18): K 3.5, creatinine 1.21, hgb 11.9, LDL 16, HDL 40 Labs (12/18): K 3.7, creatinine 1.4 Labs (2/19): hgb 13.2 Labs @ [facility] (3/19): K 4.7, creatinine 1.4, ANA negative, RF negative Labs (4/19): K 3.9, creatinine 1.52 => 1.63  PMH: 1. Sick sinus syndrome s/p [employer] PCM.  2. HTN 3. H/o SBO 4. Renal cell carcinoma s/p right nephrectomy in 2008.  5. 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  Fibroscan in 11/14 showed advanced fibrosis concerning for cirrhosis. EGD in 11/14 showed no varices.  6. Diastolic CHF: Echo (5/88) with EF 60-65%, bioprosthetic aortic valve with mean gradient 15 mmHg, mild MR.  CPX (9/14): peak VO2 10.4, VE/VCO2 43.8, inferolateral ST depression and ST elevation in V1/V2 with severe dyspnea and chest pain, severe functional limitation with ischemic changes.  Echo (9/14) with EF 60-65%, normal RV size and systolic function, mild-moderate MR, bioprosthetic aortic valve normal, PA systolic pressure 51 mmHg, no evidence for pericardial constriction though exam incomplete for constriction.  Echo (11/15) with EF 55-60%, bioprosthetic aortic valve, mild-moderate MR, moderate TR, PA systolic pressure 42  mmHg.  - TEE (5/17) with EF 60-65%, normal bioprosthetic aortic valve with mean gradient 13 mmHg, normal RV size and systolic function. - Echo (1/18) with EF 60-65%, normal bioprosthetic aortic valve, mild-moderate MR, PASP 35 mmHg.  - Echo (10/18) with EF 55-60%, bioprosthetic aortic valve mean gradient 10 mmHg, MAC with mild mitral stenosis (mean gradient 7) and mild mitral regurgitation, moderate to severe TR, PASP 43 mmHg.  - RHC (9/18): mean RA 13, PA 44/18 mean 33, mean PCWP 19, CI 3.45, PVR 2.7 WU.  7. TAH-BSO 8. Type I diabetes: Has insulin pump.  9. H/o ischemic colitis. 10. H/o PUD. 11. Diabetic gastroparesis. 12. Hyperlipidemia: Myalgias with Crestor and atorvastatin, elevated LFTs with simvastatin.  Myalgias with > 10 mg pravastatin. Now on Praluent.  13. CKD 14. Bicuspid aortic valve s/p 19 mm Edwards pericardial valve in 1/11 (had post-operative Dresslers syndrome).   15. CAD:  CABG with SVG-RCA in 12/05.  Redo SVG-RCA in 1/11 with AVR.  LHC/RHC (9/14) with mean RA 18, PA 62/25 mean 43, mean PCWP 26, CI 2.8, 70-80% distal LM stenosis, 80% ostial LAD stenosis, total occlusion RCA, SVG-PDA patent.  Patient had DES LM into LAD.  Plan to continue long-term ASA/Brilinta (poor responder to Plavix).  ETT-cardiolite (4/15) with 6' exercise, EF 60%, ST depression in recovery and mild chest pain, no ischemia or infarction by perfusion images.  Unstable angina 11/15 with 80% LM, patent SVG-RCA => DES to ostial LM.   - Lexiscan Cardiolite (2/18): EF 62%, no ischemia or infarction (normal).  She had a significant reaction to [place] and probably should not get again.  - Cardiolite (9/18): EF 72%, prior inferior infarct with mild peri-infarct ischemia.  - LHC (9/18): 2 layers stent left main - ostial LAD with 50% dLM in-stent restenosis, 75+% ostial LCx, totally occluded RCA, SVG-RCA patent.  Medical management planned.  - ETT-Cardiolite (4/19): EF normal, 7'11" exercise, no ischemia or infarction.   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  17. Anemia: Iron-deficiency.  Suspect chronic GI blood loss.  EGD in 9/14 was unremarkable.  Capsule endoscopy 10/14 was unremarkable.  Has required periodic blood transfusions.  18. Atrial fibrillation: Paroxysmal, noted by [facility] interrogation.  19. Carotid stenosis: Carotid dopplers (2/15) with 40-59% bilateral stenosis. Carotid dopplers (3/16) with 40-59% bilateral ICA stenosis. Carotid dopplers (3/17) with 40-59% bilateral ICA stenosis.  - Carotid dopplers (3/18) with mild BICA stenosis.  20. 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  22. C difficile colitis 2017 23. Gout 24. ?TIA: head CT negative.  25. OSA: Moderate, uses CPAP.  26. Chronic thrombocytopenia  SH: Married, lives in [place], nonsmoker. Former school principal.  FH: CAD  ROS: All systems reviewed and negative except as per HPI.   Current Outpatient Medications  Medication Sig Dispense Refill  . Alirocumab (PRALUENT) 150 MG/ML SOPN Inject 1 pen into the skin every 14 (fourteen) days. 2 pen 11  . ALPRAZolam (XANAX) 0.5 MG tablet Take 0.5 mg by mouth at bedtime. May take additional dose daily for anxiety    . amLODipine (NORVASC) 5 MG tablet TAKE 1/2 TABLET(2.5 MG) BY MOUTH DAILY 45 tablet 0  . APIDRA 100 UNIT/ML injection Inject 46 Units into the skin as directed. INFUSE THROUGH INSULIN PUMP UTD::[place] 46.15 units daily, Bolus depends on carb intake  2  . aspirin EC 81 MG tablet Take 81 mg by mouth at bedtime.    . Calcium Carbonate-Vitamin D (RA CALCIUM PLUS VITAMIN D) 600-400 MG-UNIT per tablet Take 1 tablet by mouth daily.     . Cholecalciferol 2000 UNITS TABS Take 1 tablet by mouth every morning.     . clobetasol cream (TEMOVATE) 2.29 % Apply 1 application topically 2 (two) times daily.    . Coenzyme Q10 (CO Q 10)  100 MG CAPS Take 1 capsule by mouth at bedtime.     . docusate sodium (COLACE) 100 MG capsule Take 100 mg by mouth at bedtime.    . Fe Fum-FA-B Cmp-C-Zn-Mg-Mn-Cu (HEMATINIC PLUS COMPLEX) 106-1 MG TABS Take one tablet three times a week as directed. 36 each 3  . Fe Fum-FA-B Cmp-C-Zn-Mg-Mn-Cu (HEMATINIC PLUS VIT/MINERALS) 106-1 MG TABS TAKE 1 TABLET BY MOUTH THREE TIMES A WEEK(MONDAY, WEDNESDAY, AND FRIDAY) OR AS DIRECTED WITH A VITAMIN "C" 90 tablet 0  .  fluticasone (FLONASE) 50 MCG/ACT nasal spray Place into both nostrils daily.    Marland Kitchen glucagon (GLUCAGON EMERGENCY) 1 MG injection Inject 1 mg into the vein once as needed (for severe reaction). Reported on 12/05/2015    . isosorbide mononitrate (IMDUR) 120 MG 24 hr tablet Take 120 mg by mouth daily.    Marland Kitchen latanoprost (XALATAN) 0.005 % ophthalmic solution Place 1 drop into both eyes at bedtime.    . Magnesium Oxide 250 MG TABS Take 250 mg by mouth at bedtime.     . metolazone (ZAROXOLYN) 2.5 MG tablet Take 2.5 mg (1 tab) once as directed by CHF clinic. Call before taking [phone] 3 tablet 0  . metoprolol tartrate (LOPRESSOR) 50 MG tablet TAKE 1 AND 1/2 TABLETS(75 MG) BY MOUTH TWICE DAILY 270 tablet 2  . Multiple Vitamins-Minerals (MULTIVITAMIN WITH MINERALS) tablet Take 1 tablet by mouth every morning.     . nitroGLYCERIN (NITROLINGUAL) 0.4 MG/SPRAY spray Place 1 spray under the tongue every 5 (five) minutes x 3 doses as needed for chest pain. 14.7 g 0  . pantoprazole (PROTONIX) 40 MG tablet Take 1 tablet (40 mg total) by mouth daily. 30 tablet 11  . Polyethyl Glycol-Propyl Glycol (SYSTANE) 0.4-0.3 % SOLN Apply 1 drop to eye daily as needed (dry eyes).    . potassium chloride (MICRO-K) 10 MEQ CR capsule Take 4 capsules (40 mEq total) by mouth 2 (two) times daily. 720 capsule 3  . pravastatin (PRAVACHOL) 10 MG tablet TAKE 1 TABLET BY MOUTH ONCE DAILY 90 tablet 3  . spironolactone (ALDACTONE) 25 MG tablet Take 0.5 tablets (12.5 mg total) by mouth daily.  45 tablet 3  . ticagrelor (BRILINTA) 60 MG TABS tablet TAKE 1 TABLET(60 MG) BY MOUTH TWICE DAILY 180 tablet 2  . torsemide (DEMADEX) 20 MG tablet Take 51m in the AM and 48mat 3 pm 240 tablet 6  . triamcinolone cream (KENALOG) 0.5 % Apply 1 application topically daily as needed (inflamation).     . Marland KitchenLORIC 40 MG tablet Take 1 tablet (40 mg total) by mouth at bedtime. 90 tablet 3  . UNABLE TO FIND Med Name: So Clean CPAP cleaning system 1 each 0  . vitamin C (ASCORBIC ACID) 500 MG tablet Take 500 mg by mouth 3 (three) times a week.     No current facility-administered medications for this encounter.    Filed Weights   01/03/18 0923  Weight: 117 lb 4 oz (53.2 kg)    BP 122/62   Pulse 77   Wt 117 lb 4 oz (53.2 kg)   SpO2 98%   BMI 20.77 kg/m  General: NAD Neck: No JVD, no thyromegaly or thyroid nodule.  Lungs: Clear to auscultation bilaterally with normal respiratory effort. CV: Nondisplaced PMI.  Heart regular S1/S2, no S3/S4, no murmur.  No peripheral edema.  No carotid bruit.  Normal pedal pulses.  Abdomen: Soft, nontender, no hepatosplenomegaly, no distention.  Skin: Intact without lesions or rashes.  Neurologic: Alert and oriented x 3.  Psych: Normal affect. Extremities: No clubbing or cyanosis.  HEENT: Normal.   Assessment/Plan:  1. CAD: s/p SVG-RCA in 12/05, then redo SVG-RCA in 1/11--> distal left main/proximal LAD severe stenosis in 9/14.  PCI with DES to distal LM/proximal LAD was done rather than bypass as she would have been a poor candidate for redo sternotomy (2 prior sternotomies and history of chest wall radiation for Hodgkins disease).  Recurrent unstable angina with 80% ostial LM treated with repeat DES in 11/15.  Lexiscan Cardiolite in 9/18 inferior changes.  Repeat LHC (9/18) showed 50% distal left main in-stent restenosis and 75+% ostial LCx stenosis. I reviewed the films at the time with interventional cardiology => would be very difficult to intervene on the  ostial LCx through 2 overlapping stents from left main into the LAD.  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  Given no ACS, we planned medical management and increased imdur.  Cardiolite in 3/19 showed no ischemia or infarction.  She is now having minimal chest pain, and what she has is atypical.  - Continue Brilinta 60 mg bid (poor responder to plavix) long-term given recurrent left main disease.  This has been complicated in the past by suspected slow GI bleed. Most recent hemoglobin was normal. - Continue ASA 81, statin, metoprolol, and Imdur 120 mg daily.    - She was unable to tolerate ranolazine.  2. Bioprosthetic aortic valve replacement: In setting of bicuspid aortic valve, thoracic aorta not dilated on 2017 TEE.  Valve looked ok on 10/18 echo.   3. Chronic diastolic CHF: Most recent echo in 10/18 showed normal LV systolic function but also showed moderate to severe TR.  RHC was concerning for right > left heart failure. She is doing better now.  She is not volume overloaded on exam, NYHA class II symptoms. She has significant nocturnal cramps that seem to be related to her pm torsemide. She is not taking metolazone.  - Decrease torsemide to 80 qam/40 qpm, and she should take her pm torsemide at 3-4 pm to minimize nocturnal cramps. BMET, Mg check today.  - Continue spironolactone 12.5 mg daily. 4. CKD:  Patient has a single kidney s/p right nephrectomy.  CKD stage 3.  BMET today.  5. Hyperlipidemia: She has not been able to tolerate any higher potency statin than pravastatin 10 mg daily.   - Continue pravastatin and Praluent, check lipids today.  6. Anemia: Suspected slow GI bleed.  Unremarkable EGD and capsule endoscopy in fall 2014.  Unfortunately, needs to stay on Brilinta long-term (Plavix non-responder).  Will need to follow closely (sees hematology). Recently, hemoglobin has been more stable.   7. Atrial fibrillation: Paroxysmal, brief episodes noted on PCM  interrogation. Given GI bleeding and need to be on Brilinta, risks have been thought to outweigh benefits of anticoagulation.  She is not a good Multaq candidate with CHF.  If she needs an antiarrhythmic, consider Tikosyn or Sotalol.   - Regular on exam, < 1% atrial fibrillation on device interrogation when she saw Dr. Lovena Le recently.  - We were unable to place Watchman device due to nickel allergy.   8. Carotid bruit: Only mild stenosis on last dopplers in 3/18.  9. SSS: Pacemaker in place.  10. HTN: controlled. Continue current regimen.   11. OSA: Moderate, using CPAP.  12. ?TIA: Transient dysarthria in 3/18, could have been afib-related TIA.  No recurrent symptoms.  13. Thrombocytopenia: Chronic. Follows with hematology. .   Followup 3 months.   [name] 01/03/2018

## 2018-01-03 NOTE — Patient Instructions (Signed)
Decrease afternoon Torsemide dose to 40 mg (2 tabs) and take it at 3-4 pm  Labs today  Your physician recommends that you schedule a follow-up appointment in: 3 months

## 2018-01-07 ENCOUNTER — Encounter (HOSPITAL_COMMUNITY): Payer: Self-pay

## 2018-01-07 ENCOUNTER — Telehealth (HOSPITAL_COMMUNITY): Payer: Self-pay

## 2018-01-07 MED ORDER — ICOSAPENT ETHYL 1 G PO CAPS: 2.0000 g | ORAL_CAPSULE | Freq: Two times a day (BID) | ORAL | 3 refills | Status: DC

## 2018-01-07 NOTE — Telephone Encounter (Signed)
Notes recorded by Shirley Muscat, RN on 01/07/2018 at 11:26 AM EDT Pt aware of results, Lipid Letter mailed to patient, and agreeable to med changes (changes made in [facility])  ------  Notes recorded by Larey Dresser, MD on 01/03/2018 at 4:07 PM EDT TGs high would add Vascepa 2 g bid

## 2018-01-14 ENCOUNTER — Encounter (HOSPITAL_COMMUNITY): Payer: Self-pay | Admitting: Emergency Medicine

## 2018-01-14 ENCOUNTER — Ambulatory Visit (HOSPITAL_COMMUNITY)
Admission: EM | Admit: 2018-01-14 | Discharge: 2018-01-14 | Disposition: A | Discharge status: Home / Self Care | Payer: Medicare Other | Attending: Family Medicine | Admitting: Family Medicine

## 2018-01-14 ENCOUNTER — Encounter: Payer: Self-pay | Admitting: Family Medicine

## 2018-01-14 DIAGNOSIS — T161XXA Foreign body in right ear, initial encounter: Secondary | ICD-10-CM | POA: Diagnosis not present

## 2018-01-14 NOTE — ED Triage Notes (Signed)
PT believes she has something in her ear. This started at 1600

## 2018-01-14 NOTE — ED Provider Notes (Signed)
[place]    CSN: [id number] Arrival date & time: 01/14/18  1723     History   Chief Complaint Chief Complaint  Patient presents with  . Foreign Body in [name] [name] is a 67 y.o. female with complicated past medical history, most notable for hypertension, aortic stenosis with stent, previous TIA, pacemaker present, history of breast cancer presenting today for evaluation of possible foreign body in ear.  She is concerned that there is a bug in her ear.  She first noticed a fluttering sensation around 4 PM this afternoon.  Her husband but all of oil in her ear which has since stopped the fluttering.  She is unsure if the bug was removed.  She does not have any persistent symptoms at the time of visit, she is endorsing some slight tenderness below her earlobe.  Denies urinary symptoms of congestion, rhinorrhea, cough.  HPI  Past Medical History:  Diagnosis Date  . Anxiety   . Aortic stenosis    a. bicuspid aortic valve; mean gradient of 20 mmHg in 2/10; b. s/p bioprosth AVR (19-mm Edwards pericardial valve) with a redo coronary artery bypass graft procedure in 07/2009; postoperative Dressler's syndrome    . Arthritis   . Breast cancer ([place])   . Carcinoma, renal cell ([place]) 05/2007   Laparoscopic right nephrectomy  . 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 Chronic diastolic CHF (congestive heart failure) ([place])    a. 9.2014 EchoP EF 60-65%, no rwma, bioprosth AVR, mean gradient of 20mHg, mild to mod MR, PASP 582mg.  . CKD (chronic kidney disease), stage II   . Colitis, ischemic ([place]  . Coronary artery disease    a. initial CABG with SVG-RCA in 12/05. b. redo SVG-RCA and bioprosthetic AVR in 1/11. d. 9/14 - PCI with DES to distal LM/proximal LAD was done rather than bypass as she would have been a poor candidate for redo sternotomy. d. S/p DES to prox LM 05/2014.  . Diabetes mellitus 03/2010    TYPE II: Hemoglobin A1c of 7.4 in  03/2010; 8.4 in  06/2010; treated with insulin pump  . Duodenal ulcer    Remote; H. pylori positive  . Gastroparesis due to DM ([facility]10/04/2012  . Genetic testing 01/14/2017   [name] underwent genetic counseling and testing for hereditary cancer syndromes on 01/01/2017. Her results were negative for mutations in all 83 genes analyzed by [facility]'s 83-gene Common Hereditary Cancers Panel. Genes analyzed include: ALK, APC, ATM, AXIN2, BAP1, BARD1, BLM, BMPR1A, BRCA1, BRCA2, BRIP1, CASR, CDC73, CDH1, CDK4, CDKN1B, CDKN1C, CDKN2A, CEBPA, CHEK2, CTNNA1, DICER1, DIS3L2,   . Hodgkin's disease ([facility]1991   [place] radiation therapy  . Hodgkin's disease ([facility]  . Hyperlipidemia   . Hyperplastic gastric polyp 06/23/2012  . Hypertension   . Iron deficiency anemia    a. 03/2013 EGD: essentially normal. Possible slow GIB.  . Migraines   . NASH (nonalcoholic steatohepatitis) 1999   -biopsy in 1999  . OSA on CPAP 04/24/2017   Moderate with AHI 22/hr now on CPAP at 18cm H2o  . Osteopenia     hip on DEXA in October 2007.  . Osteoporosis   . Oxygen deficiency   . Pacemaker    six sinus syndrome  . PAF (paroxysmal atrial fibrillation) ([facility]   a.  h/o brief episodes noted on ppm interrogation. b. given GI bleeding and need to be on both ASA 81 and Brilinta, risks likely outweigh  benefits of anticoagulation.   . Small bowel obstruction (HCC)    Recurrent; resolved after resection of a lipoma  . Syncope    a. H/o recurrent syncope with pauses on loop recorder s/p Medtronic pacemaker 07/2012.    Patient Active Problem List   Diagnosis Date Noted  . OSA on CPAP 04/24/2017  . Genetic testing 01/14/2017  . Insulin pump status 09/12/2016  . S/P left mastectomy 07/29/2016  . History of breast cancer 03/29/2016  . Dense breast 12/11/2015  . Chronic anticoagulation 12/11/2015  . Hx of radiation therapy 04/23/2015  . Thrombocytopenia ([place]) 04/23/2015  . 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 Radiation therapy complication 82/95/6213  . History of nodular sclerosis Hodgkin's disease 10/27/2014  . Insulin dependent type 1 diabetes mellitus ([place]) 10/07/2014  . Claudication ([place]) 07/29/2014  . PAF (paroxysmal atrial fibrillation) ([place])   . Coronary artery disease involving native coronary artery of native heart with unstable angina pectoris ([place])   . Unstable angina ([place]) 04/11/2013  . Chronic diastolic CHF (congestive heart failure) ([place]) 04/03/2013  . Artificial cardiac pacemaker 03/12/2013  . Chronic kidney disease (CKD), stage III (moderate) ([place]) 03/12/2013  . Big thyroid 03/12/2013  . Encounter for fitting or adjustment of insulin pump 03/12/2013  . Congestive heart failure ([place]) 03/12/2013  . Lipoma of neck 09/23/2012  . Pacemaker 09/08/2012  . Atrial flutter ([place]) 07/23/2012  . Sick sinus syndrome ([place]) 07/21/2012  . Gastroparesis due to DM ([place]) 05/01/2012  . Hypercalcemia   . Cholelithiasis 07/20/2011  . Aortic stenosis status post 19 mm Edwards pericardial valve replacement 2011   . Arteriosclerotic cardiovascular disease (ASCVD)   . Osteopenia   . NASH (nonalcoholic steatohepatitis)   . Renal cell carcinoma (HCC)     Class: History of  . Duodenal ulcer     Class: History of  . Ischemic colitis     Class: History of  . Gastroesophageal reflux disease   . Hypertension   . Small bowel obstruction ([place])   . Hyperlipidemia 04/11/2010  . Colonic polyps, adenomatous 09/29/2009    Past Surgical History:  Procedure Laterality Date  . ABDOMINAL HYSTERECTOMY    . AORTIC VALVE REPLACEMENT  2011   Aortic valve replacement surgery with a 19 mm bioprosthetic device post redo CABG January 2011.  Marland Kitchen BONE MARROW BIOPSY    . BREAST EXCISIONAL BIOPSY     benign, bilateral  . CARDIAC CATHETERIZATION    . CERVICAL BIOPSY    . COLONOSCOPY     Multiple with adenomatous polyps  . COLONOSCOPY  06/23/2012    Procedure: COLONOSCOPY;  Surgeon: Gatha Mayer, MD;  Location: [facility] ENDOSCOPY;  Service: Endoscopy;  Laterality: N/A;  . CORONARY ARTERY BYPASS GRAFT  2005, 2011   CABG-most recent SVG to RCA-12/05; RCA occluded with patent graft in 2006; Redo bypass in 07/2009  . ESOPHAGOGASTRODUODENOSCOPY    . ESOPHAGOGASTRODUODENOSCOPY  06/23/2012   Procedure: ESOPHAGOGASTRODUODENOSCOPY (EGD);  Surgeon: Gatha Mayer, MD;  Location: [facility];  Service: Endoscopy;  Laterality: N/A;  . ESOPHAGOGASTRODUODENOSCOPY N/A 04/06/2013   Procedure: ESOPHAGOGASTRODUODENOSCOPY (EGD);  Surgeon: Irene Shipper, MD;  Location: [facility] ENDOSCOPY;  Service: Endoscopy;  Laterality: N/A;  . EXPLORATORY LAPAROTOMY W/ BOWEL RESECTION     Small bowel resection of lipoma  . GIVENS CAPSULE STUDY N/A 04/30/2013   Procedure: GIVENS CAPSULE STUDY;  Surgeon: Gatha Mayer, MD;  Location: [place];  Service: Endoscopy;  Laterality: N/A;  . LEFT AND RIGHT HEART CATHETERIZATION WITH CORONARY ANGIOGRAM N/A 04/07/2013   Procedure: LEFT AND RIGHT HEART CATHETERIZATION WITH CORONARY ANGIOGRAM;  Surgeon: Larey Dresser, MD;  Location: [facility] CATH LAB;  Service: Cardiovascular;  Laterality: N/A;  . LEFT ATRIAL APPENDAGE OCCLUSION N/A 02/16/2016   Watchman not placed - nickel allergy  . LEFT HEART CATHETERIZATION WITH CORONARY/GRAFT ANGIOGRAM N/A 06/07/2014   Procedure: LEFT HEART CATHETERIZATION WITH Beatrix Fetters;  Surgeon: Burnell Blanks, MD;  Location: [facility] CATH LAB;  Service: Cardiovascular;  Laterality: N/A;  . LIVER BIOPSY    . LOOP RECORDER IMPLANT N/A 12/10/2011   Procedure: LOOP RECORDER IMPLANT;  Surgeon: Evans Lance, MD;  Location: [facility] CATH LAB;  Service: Cardiovascular;  Laterality: N/A;  . MALONEY DILATION  06/23/2012   Procedure: [name] DILATION;  Surgeon: Gatha Mayer, MD;  Location: [facility] ENDOSCOPY;  Service: Endoscopy;;  54 fr  . MASTECTOMY Left 05/17/2015   total   . NEPHRECTOMY  2008   Laparoscopic right  nephrectomy, renal cell carcinoma  . PACEMAKER INSERTION  08/21/2012   Medtronic Adapta L dual-chamber pacemaker  . PERCUTANEOUS CORONARY STENT INTERVENTION (PCI-S) N/A 04/09/2013   Procedure: PERCUTANEOUS CORONARY STENT INTERVENTION (PCI-S);  Surgeon: Burnell Blanks, MD;  Location: [facility] CATH LAB;  Service: Cardiovascular;  Laterality: N/A;  . PERMANENT PACEMAKER INSERTION N/A 08/21/2012   Procedure: PERMANENT PACEMAKER INSERTION;  Surgeon: Evans Lance, MD;  Location: [facility] CATH LAB;  Service: Cardiovascular;  Laterality: N/A;  . RIGHT HEART CATH AND CORONARY/GRAFT ANGIOGRAPHY N/A 04/19/2017   Procedure: RIGHT HEART CATH AND CORONARY/GRAFT ANGIOGRAPHY;  Surgeon: Larey Dresser, MD;  Location: [place] CV LAB;  Service: Cardiovascular;  Laterality: N/A;  . TEE WITHOUT CARDIOVERSION N/A 12/07/2015   Procedure: TRANSESOPHAGEAL ECHOCARDIOGRAM (TEE);  Surgeon: Larey Dresser, MD;  Location: [place];  Service: Cardiovascular;  Laterality: N/A;  . TOTAL ABDOMINAL HYSTERECTOMY W/ BILATERAL SALPINGOOPHORECTOMY  2000   endometriosis and ovarian cysts  . TOTAL MASTECTOMY Left 05/17/2015   Procedure: LEFT TOTAL MASTECTOMY;  Surgeon: Rolm Bookbinder, MD;  Location: [place];  Service: General;  Laterality: Left;  . TUMOR EXCISION  1981   removal of Hodgkins lymphoma    OB History   None      Home Medications    Prior to Admission medications   Medication Sig Start Date End Date Taking? Authorizing Provider  Alirocumab (PRALUENT) 150 MG/ML SOPN Inject 1 pen into the skin every 14 (fourteen) days. 08/23/17   Larey Dresser, MD  ALPRAZolam Duanne Moron) 0.5 MG tablet Take 0.5 mg by mouth at bedtime. May take additional dose daily for anxiety    [provider]  amLODipine (NORVASC) 5 MG tablet TAKE 1/2 TABLET(2.5 MG) BY MOUTH DAILY 11/26/17   Larey Dresser, MD  APIDRA 100 UNIT/ML injection Inject 46 Units into the skin as directed. INFUSE THROUGH INSULIN PUMP UTD::[place] 46.15 units  daily, Bolus depends on carb intake 02/04/15   [provider]  aspirin EC 81 MG tablet Take 81 mg by mouth at bedtime.    [provider]  Calcium Carbonate-Vitamin D (RA CALCIUM PLUS VITAMIN D) 600-400 MG-UNIT per tablet Take 1 tablet by mouth daily.     [provider]  Cholecalciferol 2000 UNITS TABS Take 1 tablet by mouth every morning.     [provider]  clobetasol cream (TEMOVATE) 3.78 % Apply 1 application topically 2 (two) times daily.    [provider]  Coenzyme Q10 (CO Q 10) 100 MG CAPS Take 1 capsule by mouth at bedtime.     [provider]  docusate sodium (COLACE) 100 MG capsule Take 100 mg by mouth at bedtime.    [provider]  Fe Fum-FA-B Cmp-C-Zn-Mg-Mn-Cu (HEMATINIC PLUS COMPLEX) 106-1 MG TABS Take one tablet three times a week as directed. 12/12/17   [name], MD  Fe Fum-FA-B Cmp-C-Zn-Mg-Mn-Cu (HEMATINIC PLUS VIT/MINERALS) 106-1 MG TABS TAKE 1 TABLET BY MOUTH THREE TIMES A WEEK(MONDAY, WEDNESDAY, AND FRIDAY) OR AS DIRECTED WITH A VITAMIN "C" 12/17/17   Nicholas Lose, MD  fluticasone (FLONASE) 50 MCG/ACT nasal spray Place into both nostrils daily.    [provider]  glucagon (GLUCAGON EMERGENCY) 1 MG injection Inject 1 mg into the vein once as needed (for severe reaction). Reported on 12/05/2015    [provider]  Icosapent Ethyl (VASCEPA) 1 g CAPS Take 2 capsules (2 g total) by mouth 2 (two) times daily. 01/07/18   Larey Dresser, MD  isosorbide mononitrate (IMDUR) 120 MG 24 hr tablet Take 120 mg by mouth daily.    [provider]  latanoprost (XALATAN) 0.005 % ophthalmic solution Place 1 drop into both eyes at bedtime.    [provider]  Magnesium Oxide 250 MG TABS Take 250 mg by mouth at bedtime.     [provider]  metolazone (ZAROXOLYN) 2.5 MG tablet Take 2.5 mg (1 tab) once as directed by CHF clinic. Call before taking [phone] 03/06/17   Larey Dresser,  MD  metoprolol tartrate (LOPRESSOR) 50 MG tablet TAKE 1 AND 1/2 TABLETS(75 MG) BY MOUTH TWICE DAILY 10/07/17   Evans Lance, MD  Multiple Vitamins-Minerals (MULTIVITAMIN WITH MINERALS) tablet Take 1 tablet by mouth every morning.     [provider]  nitroGLYCERIN (NITROLINGUAL) 0.4 MG/SPRAY spray Place 1 spray under the tongue every 5 (five) minutes x 3 doses as needed for chest pain. 10/24/17   Larey Dresser, MD  pantoprazole (PROTONIX) 40 MG tablet Take 1 tablet (40 mg total) by mouth daily. 03/21/17   Larey Dresser, MD  Polyethyl Glycol-Propyl Glycol (SYSTANE) 0.4-0.3 % SOLN Apply 1 drop to eye daily as needed (dry eyes).    [provider]  potassium chloride (MICRO-K) 10 MEQ CR capsule Take 4 capsules (40 mEq total) by mouth 2 (two) times daily. 11/19/17   Larey Dresser, MD  pravastatin (PRAVACHOL) 10 MG tablet TAKE 1 TABLET BY MOUTH ONCE DAILY 03/26/17   Larey Dresser, MD  spironolactone (ALDACTONE) 25 MG tablet Take 0.5 tablets (12.5 mg total) by mouth daily. 09/10/17   Larey Dresser, MD  ticagrelor (BRILINTA) 60 MG TABS tablet TAKE 1 TABLET(60 MG) BY MOUTH TWICE DAILY 04/29/17   Larey Dresser, MD  torsemide [facility]) 20 MG tablet Take 43m in the AM and 464mat 3 pm 01/03/18   McLarey DresserMD  triamcinolone cream (KENALOG) 0.5 % Apply 1 application topically daily as needed (inflamation).     [provider]  ULORIC 40 MG tablet Take 1 tablet (40 mg total) by mouth at bedtime. 11/29/17   HaCaren MacadamMD  UNABLE TO FIND Med Name: So Clean CPAP cleaning system 09/24/17   HaCaren MacadamMD  vitamin C (ASCORBIC ACID) 500 MG tablet Take 500 mg by mouth 3 (three) times a week.    [provider]    Family History Family History  Problem Relation Age of Onset  . Heart  attack Mother        d.61  . Heart disease Mother   . Heart attack Father        d.60  . Diabetes Father   . Heart disease Father   . Lung cancer Sister        d.61  metastases to brain. History of smoking. Maternal half-sister.  . Cancer Sister 30       unspecified gynecologic cancer (either cervical or ovarian) in her 71s.  Marland Kitchen Heart attack Maternal Grandmother   . Cancer Maternal Grandfather 5       d.52s oral cancer  . Hypertension Brother   . Glaucoma Brother   . Breast cancer Sister 51       d.42 from metastatic disease. Paternal half-sister.  . Colon cancer Neg Hx   . Stomach cancer Neg Hx     Social History Social History   Tobacco Use  . Smoking status: Never Smoker  . Smokeless tobacco: Never Used  Substance Use Topics  . Alcohol use: No    Alcohol/week: 0.0 oz  . Drug use: No     Allergies   Avandia [rosiglitazone maleate]; Cephalexin; Clindamycin hcl; Lincomycin; Metformin; Novolog [insulin aspart]; Penicillins; Tizanidine; Adhesive [tape]; Atorvastatin; Cholestyramine; Crestor [rosuvastatin]; Gentamycin [gentamicin]; Humalog [insulin lispro]; Iodinated diagnostic agents; Limonene; Pravastatin; Prednisone; Ranexa [ranolazine]; Simvastatin; Strawberry extract; Doxycycline; Plavix [clopidogrel bisulfate]; Codeine; Erythromycin; Hydrocodone-acetaminophen; Latex; Neomycin; Nickel; Ranitidine; Tamiflu [oseltamivir]; and Tramadol   Review of Systems Review of Systems  Constitutional: Negative for activity change, appetite change, chills, fatigue and fever.  HENT: Negative for congestion, ear pain, rhinorrhea, sinus pressure, sore throat and trouble swallowing.   Eyes: Negative for discharge and redness.  Respiratory: Negative for cough, chest tightness and shortness of breath.   Cardiovascular: Negative for chest pain.  Gastrointestinal: Negative for abdominal pain, diarrhea, nausea and vomiting.  Musculoskeletal: Negative for myalgias.  Skin: Negative for rash.  Neurological: Negative for dizziness, light-headedness and headaches.     Physical Exam Triage Vital Signs ED Triage Vitals  Enc Vitals Group     BP 01/14/18 1757  (!) 158/80     Pulse Rate 01/14/18 1757 79     Resp 01/14/18 1757 16     Temp 01/14/18 1757 98.5 F (36.9 C)     Temp Source 01/14/18 1757 Oral     SpO2 01/14/18 1757 100 %     Weight 01/14/18 1759 113 lb (51.3 kg)     Height --      Head Circumference --      Peak Flow --      Pain Score 01/14/18 1759 2     Pain Loc --      Pain Edu? --      Excl. in [place]? --    No data found.  Updated Vital Signs BP (!) 158/80   Pulse 79   Temp 98.5 F (36.9 C) (Oral)   Resp 16   Wt 113 lb (51.3 kg)   SpO2 100%   BMI 20.02 kg/m   Visual Acuity Right Eye Distance:   Left Eye Distance:   Bilateral Distance:    Right Eye Near:   Left Eye Near:    Bilateral Near:     Physical Exam  Constitutional: She is oriented to person, place, and time. She appears well-developed and well-nourished.  No acute distress  HENT:  Head: Normocephalic and atraumatic.  Nose: Nose normal.  Bilateral ears without tenderness to palpation of external auricle, tragus and  mastoid, EAC's without erythema or swelling, TM's with good bony landmarks and cone of light. Non erythematous.  No foreign body or bugs visualized in bilateral EACs.  Small amount of dried blood present in right EAC.  Eyes: Conjunctivae are normal.  Neck: Neck supple.  Cardiovascular: Normal rate.  Pulmonary/Chest: Effort normal. No respiratory distress.  Abdominal: She exhibits no distension.  Musculoskeletal: Normal range of motion.  Neurological: She is alert and oriented to person, place, and time.  Skin: Skin is warm and dry.  Psychiatric: She has a normal mood and affect.  Nursing note and vitals reviewed.    UC Treatments / Results  Labs (all labs ordered are listed, but only abnormal results are displayed) Labs Reviewed - No data to display  EKG None  Radiology No results found.  Procedures Procedures (including critical care time)  Medications Ordered in UC Medications - No data to display  Initial Impression /  Assessment and Plan / UC Course  I have reviewed the triage vital signs and the nursing notes.  Pertinent labs & imaging results that were available during my care of the patient were reviewed by me and considered in my medical decision making (see chart for details).     Patient without foreign body in bilateral EACs, patient no longer having symptoms persisting from earlier today.  Will have patient continue to monitor, may take Tylenol for any tenderness, advised to avoid NSAIDs given patient is on Brilinta.  Follow-up here, with PCP or ENT if having persistent fluttering in ear.Discussed strict return precautions. Patient verbalized understanding and is agreeable with plan.  Final Clinical Impressions(s) / UC Diagnoses   Final diagnoses:  Foreign body of right ear, initial encounter     Discharge Instructions     Please take tylenol for any pain  Follow up if symptoms returning or becoming frequent   ED Prescriptions    None     Controlled Substance Prescriptions [place] Controlled Substance Registry consulted? Not Applicable   Janith Lima, [place] 01/14/18 1826

## 2018-01-14 NOTE — Discharge Instructions (Signed)
Please take tylenol for any pain  Follow up if symptoms returning or becoming frequent

## 2018-01-15 ENCOUNTER — Ambulatory Visit (INDEPENDENT_AMBULATORY_CARE_PROVIDER_SITE_OTHER): Payer: Medicare Other | Admitting: Family Medicine

## 2018-01-15 ENCOUNTER — Encounter: Payer: Self-pay | Admitting: Family Medicine

## 2018-01-15 ENCOUNTER — Other Ambulatory Visit: Payer: Self-pay

## 2018-01-15 VITALS — BP 122/58 | HR 75 | Temp 98.0°F | Resp 14 | Ht 63.0 in | Wt 116.1 lb

## 2018-01-15 DIAGNOSIS — J358 Other chronic diseases of tonsils and adenoids: Secondary | ICD-10-CM | POA: Diagnosis not present

## 2018-01-15 DIAGNOSIS — F419 Anxiety disorder, unspecified: Secondary | ICD-10-CM | POA: Diagnosis not present

## 2018-01-15 MED ORDER — ALPRAZOLAM 0.5 MG PO TABS: 0.5000 mg | ORAL_TABLET | Freq: Two times a day (BID) | ORAL | 1 refills | Status: DC | PRN

## 2018-01-15 NOTE — Progress Notes (Signed)
Patient ID: [name] [name], female    DOB: 1951-01-14, 67 y.o.   MRN: [id number]  Chief Complaint  Patient presents with  . Tonsil stone    Allergies Avandia [rosiglitazone maleate]; Cephalexin; Clindamycin hcl; Lincomycin; Metformin; Novolog [insulin aspart]; Penicillins; Tizanidine; Adhesive [tape]; Atorvastatin; Cholestyramine; Crestor [rosuvastatin]; Gentamycin [gentamicin]; Humalog [insulin lispro]; Iodinated diagnostic agents; Limonene; Pravastatin; Prednisone; Ranexa [ranolazine]; Simvastatin; Strawberry extract; Doxycycline; Plavix [clopidogrel bisulfate]; Codeine; Erythromycin; Hydrocodone-acetaminophen; Latex; Neomycin; Nickel; Ranitidine; Tamiflu [oseltamivir]; and Tramadol  Subjective:   [name] [name] is a 67 y.o. female who presents to [facility] today.  HPI [name] [name] comes in to office today b/c she feels like she has a tonsil stone on her tonsils.  Has had these in the past.  Has been trying to get the stone out by picking at her tonsil with her finger.  Does not believe she has had success.  Comes in today to have this checked.  Denies a sore throat.  No fevers, chills, nausea, vomiting, diarrhea.  Is able to swallow fine.  Denies any cough or shortness of breath.  Also needs a refill on her xanax. Uses the xanax prn anxiety that occurs at bedtime. Only uses if she cannot calm down and relax to sleep. Uses it prn on a sporadic basis. No side effects. Used for long time prn. Request refill. Mood good. Usually just has to use when has some stress. Getting ready to go on a big trip and husband recently had sinus surgery. Feels good.    Past Medical History:  Diagnosis Date  . Anxiety   . Aortic stenosis    a. bicuspid aortic valve; mean gradient of 20 mmHg in 2/10; b. s/p bioprosth AVR (19-mm Edwards pericardial valve) with a redo coronary artery bypass graft procedure in 07/2009; postoperative Dressler's syndrome    . Arthritis   . Breast cancer ([place])   .  Carcinoma, renal cell ([place]) 05/2007   Laparoscopic right nephrectomy  . 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 Chronic diastolic CHF (congestive heart failure) ([place])    a. 9.2014 EchoP EF 60-65%, no rwma, bioprosth AVR, mean gradient of 42mHg, mild to mod MR, PASP 596mg.  . CKD (chronic kidney disease), stage II   . Colitis, ischemic (HCWellford2008  . Coronary artery disease    a. initial CABG with SVG-RCA in 12/05. b. redo SVG-RCA and bioprosthetic AVR in 1/11. d. 9/14 - PCI with DES to distal LM/proximal LAD was done rather than bypass as she would have been a poor candidate for redo sternotomy. d. S/p DES to prox LM 05/2014.  . Diabetes mellitus 03/2010    TYPE II: Hemoglobin A1c of 7.4 in  03/2010; 8.4 in 06/2010; treated with insulin pump  . Duodenal ulcer    Remote; H. pylori positive  . Gastroparesis due to DM (HCDatil10/04/2012  . Genetic testing 01/14/2017   Ms. Zecca underwent genetic counseling and testing for hereditary cancer syndromes on 01/01/2017. Her results were negative for mutations in all 83 genes analyzed by [facility]'s 83-gene Common Hereditary Cancers Panel. Genes analyzed include: ALK, APC, ATM, AXIN2, BAP1, BARD1, BLM, BMPR1A, BRCA1, BRCA2, BRIP1, CASR, CDC73, CDH1, CDK4, CDKN1B, CDKN1C, CDKN2A, CEBPA, CHEK2, CTNNA1, DICER1, DIS3L2,   . Hodgkin's disease (HCWoonsocket1991   Mantle radiation therapy  . Hodgkin's disease (HCBon Aqu[place]  . Hyperlipidemia   . Hyperplastic gastric polyp 06/23/2012  . Hypertension   . Iron deficiency anemia    a. 03/2013 EGD: essentially normal.  Possible slow GIB.  . Migraines   . NASH (nonalcoholic steatohepatitis) 1999   -biopsy in 1999  . OSA on CPAP 04/24/2017   Moderate with AHI 22/hr now on CPAP at 18cm H2o  . Osteopenia     hip on DEXA in October 2007.  . Osteoporosis   . Oxygen deficiency   . Pacemaker    six sinus syndrome  . PAF (paroxysmal atrial fibrillation) ([place])    a.  h/o brief episodes noted on ppm interrogation. b. given GI  bleeding and need to be on both ASA 81 and Brilinta, risks likely outweigh benefits of anticoagulation.   . Small bowel obstruction (HCC)    Recurrent; resolved after resection of a lipoma  . Syncope    a. H/o recurrent syncope with pauses on loop recorder s/p Medtronic pacemaker 07/2012.    Past Surgical History:  Procedure Laterality Date  . ABDOMINAL HYSTERECTOMY    . AORTIC VALVE REPLACEMENT  2011   Aortic valve replacement surgery with a 19 mm bioprosthetic device post redo CABG January 2011.  Marland Kitchen BONE MARROW BIOPSY    . BREAST EXCISIONAL BIOPSY     benign, bilateral  . CARDIAC CATHETERIZATION    . CERVICAL BIOPSY    . COLONOSCOPY     Multiple with adenomatous polyps  . COLONOSCOPY  06/23/2012   Procedure: COLONOSCOPY;  Surgeon: Gatha Mayer, MD;  [facility];  Service: Endoscopy;  Laterality: N/A;  . CORONARY ARTERY BYPASS GRAFT  2005, 2011   CABG-most recent SVG to RCA-12/05; RCA occluded with patent graft in 2006; Redo bypass in 07/2009  . ESOPHAGOGASTRODUODENOSCOPY    . ESOPHAGOGASTRODUODENOSCOPY  06/23/2012   Procedure: ESOPHAGOGASTRODUODENOSCOPY (EGD);  Surgeon: Gatha Mayer, MD;  [facility];  Service: Endoscopy;  Laterality: N/A;  . ESOPHAGOGASTRODUODENOSCOPY N/A 04/06/2013   Procedure: ESOPHAGOGASTRODUODENOSCOPY (EGD);  Surgeon: Irene Shipper, MD;  Location[facility] ENDOSCOPY;  Service: Endoscopy;  Laterality: N/A;  . EXPLORATORY LAPAROTOMY W/ BOWEL RESECTION     Small bowel resection of lipoma  . GIVENS CAPSULE STUDY N/A 04/30/2013   Procedure: GIVENS CAPSULE STUDY;  Surgeon: Gatha Mayer, MD;  Location: [place];  Service: Endoscopy;  Laterality: N/A;  . LEFT AND RIGHT HEART CATHETERIZATION WITH CORONARY ANGIOGRAM N/A 04/07/2013   Procedure: LEFT AND RIGHT HEART CATHETERIZATION WITH CORONARY ANGIOGRAM;  Surgeon: Larey Dresser, MD;  Location: [facility] CATH LAB;  Service: Cardiovascular;  Laterality: N/A;  . LEFT ATRIAL APPENDAGE OCCLUSION N/A 02/16/2016    Watchman not placed - nickel allergy  . LEFT HEART CATHETERIZATION WITH CORONARY/GRAFT ANGIOGRAM N/A 06/07/2014   Procedure: LEFT HEART CATHETERIZATION WITH Beatrix Fetters;  Surgeon: Burnell Blanks, MD;  Location: [facility] CATH LAB;  Service: Cardiovascular;  Laterality: N/A;  . LIVER BIOPSY    . LOOP RECORDER IMPLANT N/A 12/10/2011   Procedure: LOOP RECORDER IMPLANT;  Surgeon: Evans Lance, MD;  Location: [facility] CATH LAB;  Service: Cardiovascular;  Laterality: N/A;  . MALONEY DILATION  06/23/2012   Procedure: [name] DILATION;  Surgeon: Gatha Mayer, MD;  [facility];  Service: Endoscopy;;  54 fr  . MASTECTOMY Left 05/17/2015   total   . NEPHRECTOMY  2008   Laparoscopic right nephrectomy, renal cell carcinoma  . PACEMAKER INSERTION  08/21/2012   Medtronic Adapta L dual-chamber pacemaker  . PERCUTANEOUS CORONARY STENT INTERVENTION (PCI-S) N/A 04/09/2013   Procedure: PERCUTANEOUS CORONARY STENT INTERVENTION (PCI-S);  Surgeon: Burnell Blanks, MD;  Location: [facility] CATH LAB;  Service: Cardiovascular;  Laterality: N/A;  . PERMANENT PACEMAKER INSERTION N/A 08/21/2012   Procedure: PERMANENT PACEMAKER INSERTION;  Surgeon: Evans Lance, MD;  Location: [facility] CATH LAB;  Service: Cardiovascular;  Laterality: N/A;  . RIGHT HEART CATH AND CORONARY/GRAFT ANGIOGRAPHY N/A 04/19/2017   Procedure: RIGHT HEART CATH AND CORONARY/GRAFT ANGIOGRAPHY;  Surgeon: Larey Dresser, MD;  Location: [place] CV LAB;  Service: Cardiovascular;  Laterality: N/A;  . TEE WITHOUT CARDIOVERSION N/A 12/07/2015   Procedure: TRANSESOPHAGEAL ECHOCARDIOGRAM (TEE);  Surgeon: Larey Dresser, MD;  Location: [place];  Service: Cardiovascular;  Laterality: N/A;  . TOTAL ABDOMINAL HYSTERECTOMY W/ BILATERAL SALPINGOOPHORECTOMY  2000   endometriosis and ovarian cysts  . TOTAL MASTECTOMY Left 05/17/2015   Procedure: LEFT TOTAL MASTECTOMY;  Surgeon: Rolm Bookbinder, MD;  Location: [place];  Service: General;   Laterality: Left;  . TUMOR EXCISION  1981   removal of Hodgkins lymphoma    Family History  Problem Relation Age of Onset  . Heart attack Mother        d.61  . Heart disease Mother   . Heart attack Father        d.60  . Diabetes Father   . Heart disease Father   . Lung cancer Sister        d.61 metastases to brain. History of smoking. Maternal half-sister.  . Cancer Sister 30       unspecified gynecologic cancer (either cervical or ovarian) in her 72s.  Marland Kitchen Heart attack Maternal Grandmother   . Cancer Maternal Grandfather 30       d.52s oral cancer  . Hypertension Brother   . Glaucoma Brother   . Breast cancer Sister 19       d.42 from metastatic disease. Paternal half-sister.  . Colon cancer Neg Hx   . Stomach cancer Neg Hx      Social History   Socioeconomic History  . Marital status: Married    Spouse name: Not on file  . Number of children: 2  . Years of education: Not on file  . Highest education level: Not on file  Occupational History  . Occupation: retired    Comment: elementary principal  Social Needs  . Financial resource strain: Not on file  . Food insecurity:    Worry: Not on file    Inability: Not on file  . Transportation needs:    Medical: Not on file    Non-medical: Not on file  Tobacco Use  . Smoking status: Never Smoker  . Smokeless tobacco: Never Used  Substance and Sexual Activity  . Alcohol use: No    Alcohol/week: 0.0 oz  . Drug use: No  . Sexual activity: Yes  Lifestyle  . Physical activity:    Days per week: Not on file    Minutes per session: Not on file  . Stress: Not on file  Relationships  . Social connections:    Talks on phone: Not on file    Gets together: Not on file    Attends religious service: Not on file    Active member of club or organization: Not on file    Attends meetings of clubs or organizations: Not on file    Relationship status: Not on file  Other Topics Concern  . 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 Has a son,  Aaron Edelman, who is Retail buyer.   Eats all food groups.   Wear seatbelt.    Attends church. [place], [place]. [employer].     Review of Systems   Objective:   BP (!) 122/58 (BP Location: Left Arm, Patient Position: Sitting, Cuff Size: Normal)   Pulse 75   Temp 98 F (36.7 C) (Temporal)   Resp 14   Ht 5' 3" (1.6 m)   Wt 116 lb 1.9 oz (52.7 kg)   SpO2 99%   BMI 20.57 kg/m   Physical Exam  HENT:  Head: Normocephalic and atraumatic.  Right Ear: External ear normal.  Nose: Nose normal.  Mouth/Throat: Uvula is midline and mucous membranes are normal. No oral lesions. No uvula swelling, lacerations or dental caries. No oropharyngeal exudate, posterior oropharyngeal edema, posterior oropharyngeal erythema or tonsillar abscesses. No tonsillar exudate.  Eyes: Pupils are equal, round, and reactive to light. EOM are normal. No scleral icterus.  Psychiatric: She has a normal mood and affect. Her behavior is normal. Judgment and thought content normal.     Assessment and Plan   1. Anxiety Stable, chronic.  Uses Xanax as needed.Patient counseled in detail regarding the risks of medication. Told to call or return to clinic if develop any worrisome signs or symptoms. Patient voiced understanding.  Risk versus benefits of medication discussed with patient. - ALPRAZolam (XANAX) 0.5 MG tablet; Take 1 tablet (0.5 mg total) by mouth 2 (two) times daily as needed for anxiety.  Dispense: 60 tablet; Refill: 1  2. Tonsil stone Exam of oral cavity and tonsils within normal limits.  No visualization of tonsillolith on exam.  If this returns or she has problems she will follow-up with ENT.  Reassurance given.  No follow-ups on file. [name], MD 01/15/2018

## 2018-01-15 NOTE — Progress Notes (Signed)
Patient ID: [name] [name], female    DOB: October 29, 1950, 67 y.o.   MRN: [id number]  Chief Complaint  Patient presents with  . Tonsil stone    Allergies Avandia [rosiglitazone maleate]; Cephalexin; Clindamycin hcl; Lincomycin; Metformin; Novolog [insulin aspart]; Penicillins; Tizanidine; Adhesive [tape]; Atorvastatin; Cholestyramine; Crestor [rosuvastatin]; Gentamycin [gentamicin]; Humalog [insulin lispro]; Iodinated diagnostic agents; Limonene; Pravastatin; Prednisone; Ranexa [ranolazine]; Simvastatin; Strawberry extract; Doxycycline; Plavix [clopidogrel bisulfate]; Codeine; Erythromycin; Hydrocodone-acetaminophen; Latex; Neomycin; Nickel; Ranitidine; Tamiflu [oseltamivir]; and Tramadol  Subjective:   [name] [name] is a 67 y.o. female who presents to [facility] today.  HPI HPI  Past Medical History:  Diagnosis Date  . Anxiety   . Aortic stenosis    a. bicuspid aortic valve; mean gradient of 20 mmHg in 2/10; b. s/p bioprosth AVR (19-mm Edwards pericardial valve) with a redo coronary artery bypass graft procedure in 07/2009; postoperative Dressler's syndrome    . Arthritis   . Breast cancer ([place])   . Carcinoma, renal cell ([place]) 05/2007   Laparoscopic right nephrectomy  . 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 Chronic diastolic CHF (congestive heart failure) ([place])    a. 9.2014 EchoP EF 60-65%, no rwma, bioprosth AVR, mean gradient of 40mHg, mild to mod MR, PASP 512mg.  . CKD (chronic kidney disease), stage II   . Colitis, ischemic (HCGilbertown2008  . Coronary artery disease    a. initial CABG with SVG-RCA in 12/05. b. redo SVG-RCA and bioprosthetic AVR in 1/11. d. 9/14 - PCI with DES to distal LM/proximal LAD was done rather than bypass as she would have been a poor candidate for redo sternotomy. d. S/p DES to prox LM 05/2014.  . Diabetes mellitus 03/2010    TYPE II: Hemoglobin A1c of 7.4 in  03/2010; 8.4 in 06/2010; treated with insulin pump  . Duodenal ulcer    Remote; H. pylori positive  . Gastroparesis due to DM (HCHard Rock10/04/2012  . Genetic testing 01/14/2017   Ms. Zinn underwent genetic counseling and testing for hereditary cancer syndromes on 01/01/2017. Her results were negative for mutations in all 83 genes analyzed by [facility]'s 83-gene Common Hereditary Cancers Panel. Genes analyzed include: ALK, APC, ATM, AXIN2, BAP1, BARD1, BLM, BMPR1A, BRCA1, BRCA2, BRIP1, CASR, CDC73, CDH1, CDK4, CDKN1B, CDKN1C, CDKN2A, CEBPA, CHEK2, CTNNA1, DICER1, DIS3L2,   . Hodgkin's disease (HCIndian Springs1991   Mantle radiation therapy  . Hodgkin's disease (HCSilver City  . Hyperlipidemia   . Hyperplastic gastric polyp 06/23/2012  . Hypertension   . Iron deficiency anemia    a. 03/2013 EGD: essentially normal. Possible slow GIB.  . Migraines   . NASH (nonalcoholic steatohepatitis) 1999   -biopsy in 1999  . OSA on CPAP 04/24/2017   Moderate with AHI 22/hr now on CPAP at 18cm H2o  . Osteopenia     hip on DEXA in October 2007.  . Osteoporosis   . Oxygen deficiency   . Pacemaker    six sinus syndrome  . PAF (paroxysmal atrial fibrillation) (HCBig Sandy   a.  h/o brief episodes noted on ppm interrogation. b. given GI bleeding and need to be on both ASA 81 and Brilinta, risks likely outweigh benefits of anticoagulation.   . Small bowel obstruction (HCC)    Recurrent; resolved after resection of a lipoma  . Syncope    a. H/o recurrent syncope with pauses on loop recorder s/p Medtronic pacemaker 07/2012.    Past Surgical History:  Procedure Laterality Date  . ABDOMINAL HYSTERECTOMY    .  AORTIC VALVE REPLACEMENT  2011   Aortic valve replacement surgery with a 19 mm bioprosthetic device post redo CABG January 2011.  Marland Kitchen BONE MARROW BIOPSY    . BREAST EXCISIONAL BIOPSY     benign, bilateral  . CARDIAC CATHETERIZATION    . CERVICAL BIOPSY    . COLONOSCOPY     Multiple with adenomatous polyps  . COLONOSCOPY  06/23/2012   Procedure: COLONOSCOPY;  Surgeon: Gatha Mayer, MD;  [facility];  Service: Endoscopy;  Laterality: N/A;  . CORONARY ARTERY BYPASS GRAFT  2005, 2011   CABG-most recent SVG to RCA-12/05; RCA occluded with patent graft in 2006; Redo bypass in 07/2009  . ESOPHAGOGASTRODUODENOSCOPY    . ESOPHAGOGASTRODUODENOSCOPY  06/23/2012   Procedure: ESOPHAGOGASTRODUODENOSCOPY (EGD);  Surgeon: Gatha Mayer, MD;  [facility];  Service: Endoscopy;  Laterality: N/A;  . ESOPHAGOGASTRODUODENOSCOPY N/A 04/06/2013   Procedure: ESOPHAGOGASTRODUODENOSCOPY (EGD);  Surgeon: Irene Shipper, MD;  Location: [facility] ENDOSCOPY;  Service: Endoscopy;  Laterality: N/A;  . EXPLORATORY LAPAROTOMY W/ BOWEL RESECTION     Small bowel resection of lipoma  . GIVENS CAPSULE STUDY N/A 04/30/2013   Procedure: GIVENS CAPSULE STUDY;  Surgeon: Gatha Mayer, MD;  Location: [place];  Service: Endoscopy;  Laterality: N/A;  . LEFT AND RIGHT HEART CATHETERIZATION WITH CORONARY ANGIOGRAM N/A 04/07/2013   Procedure: LEFT AND RIGHT HEART CATHETERIZATION WITH CORONARY ANGIOGRAM;  Surgeon: Larey Dresser, MD;  Location: [facility] CATH LAB;  Service: Cardiovascular;  Laterality: N/A;  . LEFT ATRIAL APPENDAGE OCCLUSION N/A 02/16/2016   Watchman not placed - nickel allergy  . LEFT HEART CATHETERIZATION WITH CORONARY/GRAFT ANGIOGRAM N/A 06/07/2014   Procedure: LEFT HEART CATHETERIZATION WITH Beatrix Fetters;  Surgeon: Burnell Blanks, MD;  Location: [facility] CATH LAB;  Service: Cardiovascular;  Laterality: N/A;  . LIVER BIOPSY    . LOOP RECORDER IMPLANT N/A 12/10/2011   Procedure: LOOP RECORDER IMPLANT;  Surgeon: Evans Lance, MD;  Location: [facility] CATH LAB;  Service: Cardiovascular;  Laterality: N/A;  . MALONEY DILATION  06/23/2012   Procedure: [name] DILATION;  Surgeon: Gatha Mayer, MD;  [facility] ENDOSCOPY;  Service: Endoscopy;;  54 fr  . MASTECTOMY Left 05/17/2015   total   . NEPHRECTOMY  2008   Laparoscopic right nephrectomy, renal cell carcinoma  . PACEMAKER INSERTION  08/21/2012    Medtronic Adapta L dual-chamber pacemaker  . PERCUTANEOUS CORONARY STENT INTERVENTION (PCI-S) N/A 04/09/2013   Procedure: PERCUTANEOUS CORONARY STENT INTERVENTION (PCI-S);  Surgeon: Burnell Blanks, MD;  Location: [facility] CATH LAB;  Service: Cardiovascular;  Laterality: N/A;  . PERMANENT PACEMAKER INSERTION N/A 08/21/2012   Procedure: PERMANENT PACEMAKER INSERTION;  Surgeon: Evans Lance, MD;  Location: [facility] CATH LAB;  Service: Cardiovascular;  Laterality: N/A;  . RIGHT HEART CATH AND CORONARY/GRAFT ANGIOGRAPHY N/A 04/19/2017   Procedure: RIGHT HEART CATH AND CORONARY/GRAFT ANGIOGRAPHY;  Surgeon: Larey Dresser, MD;  Location: [place] CV LAB;  Service: Cardiovascular;  Laterality: N/A;  . TEE WITHOUT CARDIOVERSION N/A 12/07/2015   Procedure: TRANSESOPHAGEAL ECHOCARDIOGRAM (TEE);  Surgeon: Larey Dresser, MD;  Location: [place];  Service: Cardiovascular;  Laterality: N/A;  . TOTAL ABDOMINAL HYSTERECTOMY W/ BILATERAL SALPINGOOPHORECTOMY  2000   endometriosis and ovarian cysts  . TOTAL MASTECTOMY Left 05/17/2015   Procedure: LEFT TOTAL MASTECTOMY;  Surgeon: Rolm Bookbinder, MD;  Location: [place];  Service: General;  Laterality: Left;  . TUMOR EXCISION  1981   removal of Hodgkins lymphoma    Family History  Problem Relation Age of Onset  . Heart attack Mother        d.61  . Heart disease Mother   . Heart attack Father        d.60  . Diabetes Father   . Heart disease Father   . Lung cancer Sister        d.61 metastases to brain. History of smoking. Maternal half-sister.  . Cancer Sister 30       unspecified gynecologic cancer (either cervical or ovarian) in her 67s.  Marland Kitchen Heart attack Maternal Grandmother   . Cancer Maternal Grandfather 51       d.52s oral cancer  . Hypertension Brother   . Glaucoma Brother   . Breast cancer Sister 73       d.42 from metastatic disease. Paternal half-sister.  . Colon cancer Neg Hx   . Stomach cancer Neg Hx      Social History    Socioeconomic History  . Marital status: Married    Spouse name: Not on file  . Number of children: 2  . Years of education: Not on file  . Highest education level: Not on file  Occupational History  . Occupation: retired    Comment: elementary principal  Social Needs  . Financial resource strain: Not on file  . Food insecurity:    Worry: Not on file    Inability: Not on file  . Transportation needs:    Medical: Not on file    Non-medical: Not on file  Tobacco Use  . Smoking status: Never Smoker  . Smokeless tobacco: Never Used  Substance and Sexual Activity  . Alcohol use: No    Alcohol/week: 0.0 oz  . Drug use: No  . Sexual activity: Yes  Lifestyle  . Physical activity:    Days per week: Not on file    Minutes per session: Not on file  . Stress: Not on file  Relationships  . Social connections:    Talks on phone: Not on file    Gets together: Not on file    Attends religious service: Not on file    Active member of club or organization: Not on file    Attends meetings of clubs or organizations: Not on file    Relationship status: Not on file  Other Topics Concern  . 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 Has a son, Aaron Edelman, who is Retail buyer.   Eats all food groups.   Wear seatbelt.    Attends church. [place], [place]. [employer].     Review of Systems   Objective:   BP (!) 122/58 (BP Location: Left Arm, Patient Position: Sitting, Cuff Size: Normal)   Pulse 75   Temp 98 F (36.7 C) (Temporal)   Resp 14   Ht _0  (1.6 m)   Wt 116 lb 1.9 oz (52.7 kg)   SpO2 99%   BMI 20.57 kg/m   Physical Exam   Assessment and Plan   There are no diagnoses linked to this encounter.   No follow-ups on file. Tod Persia, [place] 01/15/2018

## 2018-01-25 ENCOUNTER — Other Ambulatory Visit (HOSPITAL_COMMUNITY): Payer: Self-pay | Admitting: Cardiology

## 2018-01-26 ENCOUNTER — Other Ambulatory Visit: Payer: Self-pay | Admitting: Cardiology

## 2018-01-28 ENCOUNTER — Other Ambulatory Visit (HOSPITAL_COMMUNITY): Payer: Self-pay | Admitting: *Deleted

## 2018-01-28 ENCOUNTER — Encounter (HOSPITAL_COMMUNITY): Payer: Self-pay | Admitting: Cardiology

## 2018-01-28 ENCOUNTER — Telehealth: Payer: Self-pay

## 2018-01-28 MED ORDER — ISOSORBIDE MONONITRATE ER 120 MG PO TB24: 120.0000 mg | ORAL_TABLET | Freq: Every day | ORAL | 3 refills | Status: DC

## 2018-01-28 NOTE — Telephone Encounter (Signed)
Received fax from [facility] stating there was a generic available for Uloric. Called patient and asked if she was ok with taking the generic or wanted to stay on name brand. She stated she was ok with the generic.

## 2018-02-03 ENCOUNTER — Other Ambulatory Visit (HOSPITAL_COMMUNITY): Payer: Self-pay

## 2018-02-03 ENCOUNTER — Encounter: Payer: Self-pay | Admitting: Family Medicine

## 2018-02-03 MED ORDER — ICOSAPENT ETHYL 1 G PO CAPS: 2.0000 g | ORAL_CAPSULE | Freq: Two times a day (BID) | ORAL | 3 refills | Status: DC

## 2018-02-03 MED ORDER — TORSEMIDE 20 MG PO TABS: ORAL_TABLET | ORAL | 1 refills | Status: DC

## 2018-02-05 ENCOUNTER — Other Ambulatory Visit (HOSPITAL_COMMUNITY): Payer: Self-pay | Admitting: *Deleted

## 2018-02-05 ENCOUNTER — Encounter (HOSPITAL_COMMUNITY): Payer: Self-pay | Admitting: Cardiology

## 2018-02-05 ENCOUNTER — Encounter: Payer: Self-pay | Admitting: Family Medicine

## 2018-02-05 MED ORDER — TORSEMIDE 20 MG PO TABS: ORAL_TABLET | ORAL | 1 refills | Status: DC

## 2018-02-06 ENCOUNTER — Encounter: Payer: Self-pay | Admitting: Family Medicine

## 2018-02-06 ENCOUNTER — Other Ambulatory Visit (HOSPITAL_COMMUNITY): Payer: Self-pay | Admitting: *Deleted

## 2018-02-06 MED ORDER — TORSEMIDE 20 MG PO TABS: ORAL_TABLET | ORAL | 1 refills | Status: DC

## 2018-02-08 ENCOUNTER — Encounter (HOSPITAL_COMMUNITY): Payer: Self-pay | Admitting: *Deleted

## 2018-02-08 ENCOUNTER — Ambulatory Visit (HOSPITAL_COMMUNITY)
Admission: EM | Admit: 2018-02-08 | Discharge: 2018-02-08 | Disposition: A | Discharge status: Home / Self Care | Payer: Medicare Other | Attending: Family Medicine | Admitting: Family Medicine

## 2018-02-08 DIAGNOSIS — W57XXXA Bitten or stung by nonvenomous insect and other nonvenomous arthropods, initial encounter: Secondary | ICD-10-CM

## 2018-02-08 DIAGNOSIS — S80862A Insect bite (nonvenomous), left lower leg, initial encounter: Secondary | ICD-10-CM | POA: Diagnosis not present

## 2018-02-08 NOTE — ED Notes (Signed)
Pt discharged by provider.

## 2018-02-08 NOTE — Discharge Instructions (Signed)
No signs on infection on the wound. As discussed, surrounding redness could be due to bruising/irritation/inflammation. Continue ice compress, bactroban. Continue benadryl. Monitor for spreading redness, increased warmth, fever, follow up for reevaluation.

## 2018-02-08 NOTE — ED Provider Notes (Signed)
[employer]    CSN: [id number] Arrival date & time: 02/08/18  1253     History   Chief Complaint Chief Complaint  Patient presents with  . Insect Bite    HPI [name] [name] is a 67 y.o. female.   67 year old female with extensive medical history comes in for insect bite to the left thigh.  States area is pruritic in nature.  States noticed a whitehead that self drained at the bite site.  Came in for evaluation due to to surrounding erythema.  Denies increased warmth, denies pain.  Denies fever, chills, night sweats.  Patient has been applying Bactroban ointment to the location.     Past Medical History:  Diagnosis Date  . Anxiety   . Aortic stenosis    a. bicuspid aortic valve; mean gradient of 20 mmHg in 2/10; b. s/p bioprosth AVR (19-mm Edwards pericardial valve) with a redo coronary artery bypass graft procedure in 07/2009; postoperative Dressler's syndrome    . Arthritis   . Breast cancer ([place])   . Carcinoma, renal cell ([place]) 05/2007   Laparoscopic right nephrectomy  . 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 Chronic diastolic CHF (congestive heart failure) ([place])    a. 9.2014 EchoP EF 60-65%, no rwma, bioprosth AVR, mean gradient of 70mHg, mild to mod MR, PASP 542mg.  . CKD (chronic kidney disease), stage II   . Colitis, ischemic ([place]2008  . Coronary artery disease    a. initial CABG with SVG-RCA in 12/05. b. redo SVG-RCA and bioprosthetic AVR in 1/11. d. 9/14 - PCI with DES to distal LM/proximal LAD was done rather than bypass as she would have been a poor candidate for redo sternotomy. d. S/p DES to prox LM 05/2014.  . Diabetes mellitus 03/2010    TYPE II: Hemoglobin A1c of 7.4 in  03/2010; 8.4 in 06/2010; treated with insulin pump  . Duodenal ulcer    Remote; H. pylori positive  . Gastroparesis due to DM ([facility]10/04/2012  . Genetic testing 01/14/2017   Ms. Pandit underwent genetic counseling and testing for hereditary cancer syndromes on  01/01/2017. Her results were negative for mutations in all 83 genes analyzed by [facility]'s 83-gene Common Hereditary Cancers Panel. Genes analyzed include: ALK, APC, ATM, AXIN2, BAP1, BARD1, BLM, BMPR1A, BRCA1, BRCA2, BRIP1, CASR, CDC73, CDH1, CDK4, CDKN1B, CDKN1C, CDKN2A, CEBPA, CHEK2, CTNNA1, DICER1, DIS3L2,   . Hodgkin's disease ([facility]1991   Mantle radiation therapy  . Hodgkin's disease ([facility]  . Hyperlipidemia   . Hyperplastic gastric polyp 06/23/2012  . Hypertension   . Iron deficiency anemia    a. 03/2013 EGD: essentially normal. Possible slow GIB.  . Migraines   . NASH (nonalcoholic steatohepatitis) 1999   -biopsy in 1999  . OSA on CPAP 04/24/2017   Moderate with AHI 22/hr now on CPAP at 18cm H2o  . Osteopenia     hip on DEXA in October 2007.  . Osteoporosis   . Oxygen deficiency   . Pacemaker    six sinus syndrome  . PAF (paroxysmal atrial fibrillation) ([facility]   a.  h/o brief episodes noted on ppm interrogation. b. given GI bleeding and need to be on both ASA 81 and Brilinta, risks likely outweigh benefits of anticoagulation.   . Small bowel obstruction (HCC)    Recurrent; resolved after resection of a lipoma  . Syncope    a. H/o recurrent syncope with pauses on loop recorder s/p Medtronic pacemaker 07/2012.    Patient  Active Problem List   Diagnosis Date Noted  . OSA on CPAP 04/24/2017  . Genetic testing 01/14/2017  . Insulin pump status 09/12/2016  . S/P left mastectomy 07/29/2016  . History of breast cancer 03/29/2016  . Dense breast 12/11/2015  . Chronic anticoagulation 12/11/2015  . Hx of radiation therapy 04/23/2015  . Thrombocytopenia ([place]) 04/23/2015  . 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 Radiation therapy complication 60/04/9322  . History of nodular sclerosis Hodgkin's disease 10/27/2014  . Insulin dependent type 1 diabetes mellitus ([place]) 10/07/2014  . Claudication ([place])  07/29/2014  . PAF (paroxysmal atrial fibrillation) ([place])   . Coronary artery disease involving native coronary artery of native heart with unstable angina pectoris ([place])   . Unstable angina ([place]) 04/11/2013  . Chronic diastolic CHF (congestive heart failure) ([place]) 04/03/2013  . Artificial cardiac pacemaker 03/12/2013  . Chronic kidney disease (CKD), stage III (moderate) ([place]) 03/12/2013  . Big thyroid 03/12/2013  . Encounter for fitting or adjustment of insulin pump 03/12/2013  . Congestive heart failure ([place]) 03/12/2013  . Lipoma of neck 09/23/2012  . Pacemaker 09/08/2012  . Atrial flutter ([place]) 07/23/2012  . Sick sinus syndrome ([place]) 07/21/2012  . Gastroparesis due to DM ([place]) 05/01/2012  . Hypercalcemia   . Cholelithiasis 07/20/2011  . Aortic stenosis status post 19 mm Edwards pericardial valve replacement 2011   . Arteriosclerotic cardiovascular disease (ASCVD)   . Osteopenia   . NASH (nonalcoholic steatohepatitis)   . Renal cell carcinoma (HCC)     Class: History of  . Duodenal ulcer     Class: History of  . Ischemic colitis     Class: History of  . Gastroesophageal reflux disease   . Hypertension   . Small bowel obstruction ([place])   . Hyperlipidemia 04/11/2010  . Colonic polyps, adenomatous 09/29/2009    Past Surgical History:  Procedure Laterality Date  . ABDOMINAL HYSTERECTOMY    . AORTIC VALVE REPLACEMENT  2011   Aortic valve replacement surgery with a 19 mm bioprosthetic device post redo CABG January 2011.  Marland Kitchen BONE MARROW BIOPSY    . BREAST EXCISIONAL BIOPSY     benign, bilateral  . CARDIAC CATHETERIZATION    . CERVICAL BIOPSY    . COLONOSCOPY     Multiple with adenomatous polyps  . COLONOSCOPY  06/23/2012   Procedure: COLONOSCOPY;  Surgeon: Gatha Mayer, MD;  Location: [facility] ENDOSCOPY;  Service: Endoscopy;  Laterality: N/A;  . CORONARY ARTERY BYPASS GRAFT  2005, 2011   CABG-most recent SVG to RCA-12/05; RCA occluded with patent graft in 2006; Redo bypass in  07/2009  . ESOPHAGOGASTRODUODENOSCOPY    . ESOPHAGOGASTRODUODENOSCOPY  06/23/2012   Procedure: ESOPHAGOGASTRODUODENOSCOPY (EGD);  Surgeon: Gatha Mayer, MD;  Location: [facility];  Service: Endoscopy;  Laterality: N/A;  . ESOPHAGOGASTRODUODENOSCOPY N/A 04/06/2013   Procedure: ESOPHAGOGASTRODUODENOSCOPY (EGD);  Surgeon: Irene Shipper, MD;  Location: [facility] ENDOSCOPY;  Service: Endoscopy;  Laterality: N/A;  . EXPLORATORY LAPAROTOMY W/ BOWEL RESECTION     Small bowel resection of lipoma  . GIVENS CAPSULE STUDY N/A 04/30/2013   Procedure: GIVENS CAPSULE STUDY;  Surgeon: Gatha Mayer, MD;  Location: [facility];  Service: Endoscopy;  Laterality: N/A;  . LEFT AND RIGHT HEART CATHETERIZATION WITH CORONARY ANGIOGRAM N/A 04/07/2013   Procedure: LEFT AND RIGHT HEART CATHETERIZATION WITH CORONARY ANGIOGRAM;  Surgeon: Larey Dresser, MD;  Location[facility] CATH LAB;  Service: Cardiovascular;  Laterality: N/A;  . LEFT ATRIAL APPENDAGE OCCLUSION N/A 02/16/2016   Watchman not placed - nickel allergy  . LEFT HEART CATHETERIZATION WITH CORONARY/GRAFT ANGIOGRAM N/A 06/07/2014   Procedure: LEFT HEART CATHETERIZATION WITH Beatrix Fetters;  Surgeon: Burnell Blanks, MD;  Location: [facility] CATH LAB;  Service: Cardiovascular;  Laterality: N/A;  . LIVER BIOPSY    . LOOP RECORDER IMPLANT N/A 12/10/2011   Procedure: LOOP RECORDER IMPLANT;  Surgeon: Evans Lance, MD;  Location: [facility] CATH LAB;  Service: Cardiovascular;  Laterality: N/A;  . MALONEY DILATION  06/23/2012   Procedure: [name] DILATION;  Surgeon: Gatha Mayer, MD;  Location: [facility] ENDOSCOPY;  Service: Endoscopy;;  54 fr  . MASTECTOMY Left 05/17/2015   total   . NEPHRECTOMY  2008   Laparoscopic right nephrectomy, renal cell carcinoma  . PACEMAKER INSERTION  08/21/2012   Medtronic Adapta L dual-chamber pacemaker  . PERCUTANEOUS CORONARY STENT INTERVENTION (PCI-S) N/A 04/09/2013   Procedure: PERCUTANEOUS CORONARY STENT INTERVENTION (PCI-S);  Surgeon:  Burnell Blanks, MD;  Location: [facility] CATH LAB;  Service: Cardiovascular;  Laterality: N/A;  . PERMANENT PACEMAKER INSERTION N/A 08/21/2012   Procedure: PERMANENT PACEMAKER INSERTION;  Surgeon: Evans Lance, MD;  Location: [facility] CATH LAB;  Service: Cardiovascular;  Laterality: N/A;  . RIGHT HEART CATH AND CORONARY/GRAFT ANGIOGRAPHY N/A 04/19/2017   Procedure: RIGHT HEART CATH AND CORONARY/GRAFT ANGIOGRAPHY;  Surgeon: Larey Dresser, MD;  Location: [place] CV LAB;  Service: Cardiovascular;  Laterality: N/A;  . TEE WITHOUT CARDIOVERSION N/A 12/07/2015   Procedure: TRANSESOPHAGEAL ECHOCARDIOGRAM (TEE);  Surgeon: Larey Dresser, MD;  Location: [place];  Service: Cardiovascular;  Laterality: N/A;  . TOTAL ABDOMINAL HYSTERECTOMY W/ BILATERAL SALPINGOOPHORECTOMY  2000   endometriosis and ovarian cysts  . TOTAL MASTECTOMY Left 05/17/2015   Procedure: LEFT TOTAL MASTECTOMY;  Surgeon: Rolm Bookbinder, MD;  Location: [place];  Service: General;  Laterality: Left;  . TUMOR EXCISION  1981   removal of Hodgkins lymphoma    OB History   None      Home Medications    Prior to Admission medications   Medication Sig Start Date End Date Taking? Authorizing Provider  ALPRAZolam Duanne Moron) 0.5 MG tablet Take 1 tablet (0.5 mg total) by mouth 2 (two) times daily as needed for anxiety. 01/15/18  Yes Hagler, Apolonio Schneiders, MD  amLODipine (NORVASC) 5 MG tablet TAKE 1/2 TABLET(2.5 MG) BY MOUTH DAILY 11/26/17  Yes Larey Dresser, MD  aspirin EC 81 MG tablet Take 81 mg by mouth at bedtime.   Yes [provider]  Icosapent Ethyl (VASCEPA) 1 g CAPS Take 2 capsules (2 g total) by mouth 2 (two) times daily. 02/03/18  Yes Larey Dresser, MD  isosorbide mononitrate (IMDUR) 120 MG 24 hr tablet Take 1 tablet (120 mg total) by mouth daily. 01/28/18  Yes Larey Dresser, MD  metoprolol tartrate (LOPRESSOR) 50 MG tablet TAKE 1 AND 1/2 TABLETS(75 MG) BY MOUTH TWICE DAILY 10/07/17  Yes Evans Lance, MD    pantoprazole (PROTONIX) 40 MG tablet Take 1 tablet (40 mg total) by mouth daily. 03/21/17  Yes Larey Dresser, MD  pravastatin (PRAVACHOL) 10 MG tablet TAKE 1 TABLET BY MOUTH ONCE DAILY 03/26/17  Yes Larey Dresser, MD  spironolactone (ALDACTONE) 25 MG tablet Take 0.5 tablets (12.5 mg total) by mouth daily. 09/10/17  Yes Larey Dresser, MD  ticagrelor (BRILINTA) 60 MG TABS tablet TAKE 1 TABLET(60 MG) BY MOUTH TWICE DAILY 04/29/17  Yes Larey Dresser,  MD  torsemide (DEMADEX) 20 MG tablet Take 73m in the AM and 450mat 3 pm Patient taking differently: Take 8078mn the AM and 1m66m 3 pm 02/06/18  Yes McLeLarey Dresser  ULORIC 40 MG tablet Take 1 tablet (40 mg total) by mouth at bedtime. 11/29/17  Yes Hagler, RachApolonio Schneiders  Alirocumab (PRALUENT) 150 MG/ML SOPN Inject 1 pen into the skin every 14 (fourteen) days. 08/23/17   McLeLarey Dresser  APIDRA 100 UNIT/ML injection Inject 46 Units into the skin as directed. INFUSE THROUGH INSULIN PUMP UTD::[place] 46.15 units daily, Bolus depends on carb intake 02/04/15   [provider]  Calcium Carbonate-Vitamin D (RA CALCIUM PLUS VITAMIN D) 600-400 MG-UNIT per tablet Take 1 tablet by mouth daily.     [provider]  Cholecalciferol 2000 UNITS TABS Take 1 tablet by mouth every morning.     [provider]  clobetasol cream (TEMOVATE) 0.059.53pply 1 application topically 2 (two) times daily.    [provider]  Coenzyme Q10 (CO Q 10) 100 MG CAPS Take 1 capsule by mouth at bedtime.     [provider]  colchicine 0.6 MG tablet Take 0.6 mg by mouth daily as needed (gout flare).    [provider]  docusate sodium (COLACE) 100 MG capsule Take 100 mg by mouth at bedtime.    [provider]  Fe Fum-FA-B Cmp-C-Zn-Mg-Mn-Cu (HEMATINIC PLUS VIT/MINERALS) 106-1 MG TABS TAKE 1 TABLET BY MOUTH THREE TIMES A WEEK(MONDAY, WEDNESDAY, AND FRIDAY) OR AS DIRECTED WITH A VITAMIN "C" 12/17/17   GudeNicholas Lose   fluticasone (FLONASE) 50 MCG/ACT nasal spray Place into both nostrils daily.    [provider]  glucagon (GLUCAGON EMERGENCY) 1 MG injection Inject 1 mg into the vein once as needed (for severe reaction). Reported on 12/05/2015    [provider]  latanoprost (XALATAN) 0.005 % ophthalmic solution Place 1 drop into both eyes at bedtime.    [provider]  Magnesium Oxide 250 MG TABS Take 250 mg by mouth at bedtime.     [provider]  metolazone (ZAROXOLYN) 2.5 MG tablet Take 2.5 mg (1 tab) once as directed by CHF clinic. Call before taking ([phone]   McLeLarey Dresser  Multiple Vitamins-Minerals (MULTIVITAMIN WITH MINERALS) tablet Take 1 tablet by mouth every morning.     [provider]  nitroGLYCERIN (NITROLINGUAL) 0.4 MG/SPRAY spray PLACE 1 SPRAY UNDER THE TONGUE EVERY 5 MINUTES FOR 3 DOSES AS NEEDED FOR CHEST PAIN 01/27/18   McLeLarey Dresser  Polyethyl Glycol-Propyl Glycol (SYSTANE) 0.4-0.3 % SOLN Apply 1 drop to eye daily as needed (dry eyes).    [provider]  potassium chloride (MICRO-K) 10 MEQ CR capsule Take 4 capsules (40 mEq total) by mouth 2 (two) times daily. 11/19/17   McLeLarey Dresser  triamcinolone cream (KENALOG) 0.5 % Apply 1 application topically daily as needed (inflamation).     [provider]  UNABLE TO FIND Med Name: So Clean CPAP cleaning system 09/24/17   HaglCaren Macadam  vitamin C (ASCORBIC ACID) 500 MG tablet Take 500 mg by mouth 3 (three) times a week.    [provider]    Family History Family History  Problem Relation Age of Onset  . Heart attack Mother        d.61  . Heart disease Mother   . Heart attack Father  d.61  . Diabetes Father   . Heart disease Father   . Lung cancer Sister        d.61 metastases to brain. History of smoking. Maternal half-sister.  . Cancer Sister 30       unspecified gynecologic cancer (either cervical or ovarian) in her 16s.   Marland Kitchen Heart attack Maternal Grandmother   . Cancer Maternal Grandfather 73       d.52s oral cancer  . Hypertension Brother   . Glaucoma Brother   . Breast cancer Sister 35       d.42 from metastatic disease. Paternal half-sister.  . Colon cancer Neg Hx   . Stomach cancer Neg Hx     Social History Social History   Tobacco Use  . Smoking status: Never Smoker  . Smokeless tobacco: Never Used  Substance Use Topics  . Alcohol use: No    Alcohol/week: 0.0 oz  . Drug use: No     Allergies   Avandia [rosiglitazone maleate]; Cephalexin; Clindamycin hcl; Lincomycin; Metformin; Novolog [insulin aspart]; Penicillins; Tizanidine; Adhesive [tape]; Atorvastatin; Cholestyramine; Crestor [rosuvastatin]; Gentamycin [gentamicin]; Humalog [insulin lispro]; Iodinated diagnostic agents; Limonene; Pravastatin; Prednisone; Ranexa [ranolazine]; Simvastatin; Strawberry extract; Doxycycline; Plavix [clopidogrel bisulfate]; Codeine; Erythromycin; Hydrocodone-acetaminophen; Latex; Neomycin; Nickel; Ranitidine; Tamiflu [oseltamivir]; and Tramadol   Review of Systems Review of Systems  Reason unable to perform ROS: See HPI as above.     Physical Exam Triage Vital Signs ED Triage Vitals  Enc Vitals Group     BP 02/08/18 1314 (!) 148/58     Pulse Rate 02/08/18 1314 72     Resp 02/08/18 1314 16     Temp 02/08/18 1314 97.8 F (36.6 C)     Temp Source 02/08/18 1314 Oral     SpO2 02/08/18 1314 98 %     Weight 02/08/18 1316 115 lb (52.2 kg)     Height --      Head Circumference --      Peak Flow --      Pain Score 02/08/18 1316 1     Pain Loc --      Pain Edu? --      Excl. in [place]? --    No data found.  Updated Vital Signs BP (!) 148/58   Pulse 72   Temp 97.8 F (36.6 C) (Oral)   Resp 16   Wt 115 lb (52.2 kg)   SpO2 98%   BMI 20.37 kg/m   Physical Exam  Constitutional: She is oriented to person, place, and time. She appears well-developed and well-nourished. No distress.  HENT:  Head:  Normocephalic and atraumatic.  Eyes: Pupils are equal, round, and reactive to light. Conjunctivae are normal.  Neurological: She is alert and oriented to person, place, and time.  Skin: She is not diaphoretic.  See picture below.  Surrounding erythema more consistent with contusion versus cellulitis.  Tenderness to palpation with slight swelling.  No increased warmth.  No fluctuance.       UC Treatments / Results  Labs (all labs ordered are listed, but only abnormal results are displayed) Labs Reviewed - No data to display   EKG None  Radiology No results found.  Procedures Procedures (including critical care time)  Medications Ordered in UC Medications - No data to display  Initial Impression / Assessment and Plan / UC Course  I have reviewed the triage vital signs and the nursing notes.  Pertinent labs & imaging results that were available during my care of the  patient were reviewed by me and considered in my medical decision making (see chart for details).    No signs of infection today.  Will have patient continue ice compress, Bactroban.  Continue Benadryl for itching.  Strict return precautions given.  Patient expresses understanding and agrees to plan.  Final Clinical Impressions(s) / UC Diagnoses   Final diagnoses:  Insect bite of left lower leg, initial encounter    ED Prescriptions    None        Ok Edwards, PA-C 02/08/18 1431

## 2018-02-08 NOTE — ED Triage Notes (Addendum)
Pt noticed insect bite Tuesday afternoon. Pt states the bite has enlarged and got a "white head" that she drained. Pt applied bactroban and states she has had scant amount of purulent drainage. Pt concerned because it continues to enlarge. Pt also complains of severe itching.

## 2018-02-10 ENCOUNTER — Other Ambulatory Visit (HOSPITAL_COMMUNITY): Payer: Self-pay | Admitting: *Deleted

## 2018-02-10 MED ORDER — TORSEMIDE 20 MG PO TABS: ORAL_TABLET | ORAL | 3 refills | Status: DC

## 2018-02-11 MED ORDER — MUPIROCIN CALCIUM 2 % EX CREA: 1.0000 "application " | TOPICAL_CREAM | Freq: Two times a day (BID) | CUTANEOUS | 0 refills | Status: DC | PRN

## 2018-02-13 ENCOUNTER — Encounter: Payer: Self-pay | Admitting: Family Medicine

## 2018-02-17 ENCOUNTER — Telehealth: Payer: Self-pay | Admitting: Cardiology

## 2018-02-17 ENCOUNTER — Ambulatory Visit (INDEPENDENT_AMBULATORY_CARE_PROVIDER_SITE_OTHER): Payer: Medicare Other | Admitting: *Deleted

## 2018-02-17 DIAGNOSIS — I495 Sick sinus syndrome: Secondary | ICD-10-CM

## 2018-02-17 NOTE — Progress Notes (Signed)
Remote pacemaker transmission.   

## 2018-02-17 NOTE — Telephone Encounter (Signed)
Spoke with pt and reminded pt of remote transmission that is due today. Pt verbalized understanding.   

## 2018-02-18 ENCOUNTER — Encounter: Payer: Self-pay | Admitting: Cardiology

## 2018-02-21 LAB — CUP PACEART REMOTE DEVICE CHECK
Battery Voltage: 2.81 V
Brady Statistic AP VP Percent: 0 %
Brady Statistic AP VS Percent: 0 %
Brady Statistic AS VS Percent: 100 %
Implantable Lead Implant Date: 20140130
Implantable Lead Implant Date: 20140130
Implantable Lead Location: 753859
Implantable Lead Model: 5076
Lead Channel Impedance Value: 423 Ohm
Lead Channel Impedance Value: 580 Ohm
Lead Channel Pacing Threshold Amplitude: 0.625 V
Lead Channel Pacing Threshold Pulse Width: 0.4 ms
Lead Channel Pacing Threshold Pulse Width: 0.4 ms
Lead Channel Sensing Intrinsic Amplitude: 1.4 mV
Lead Channel Sensing Intrinsic Amplitude: 16 mV
Lead Channel Setting Pacing Pulse Width: 0.4 ms
MDC IDC LEAD LOCATION: 753860
MDC IDC MSMT BATTERY IMPEDANCE: 282 Ohm
MDC IDC MSMT BATTERY REMAINING LONGEVITY: 134 mo
MDC IDC MSMT LEADCHNL RV PACING THRESHOLD AMPLITUDE: 0.375 V
MDC IDC PG IMPLANT DT: 20140130
MDC IDC SESS DTM: 20190729193956
MDC IDC SET LEADCHNL RA PACING AMPLITUDE: 1.5 V
MDC IDC SET LEADCHNL RV PACING AMPLITUDE: 2.5 V
MDC IDC SET LEADCHNL RV SENSING SENSITIVITY: 5.6 mV
MDC IDC STAT BRADY AS VP PERCENT: 0 %

## 2018-02-27 ENCOUNTER — Other Ambulatory Visit (HOSPITAL_COMMUNITY): Payer: Self-pay

## 2018-02-27 DIAGNOSIS — D696 Thrombocytopenia, unspecified: Secondary | ICD-10-CM

## 2018-03-03 ENCOUNTER — Inpatient Hospital Stay (HOSPITAL_COMMUNITY): Payer: Medicare Other | Attending: Hematology

## 2018-03-03 DIAGNOSIS — Z8571 Personal history of Hodgkin lymphoma: Secondary | ICD-10-CM | POA: Insufficient documentation

## 2018-03-03 DIAGNOSIS — Z905 Acquired absence of kidney: Secondary | ICD-10-CM | POA: Diagnosis not present

## 2018-03-03 DIAGNOSIS — D696 Thrombocytopenia, unspecified: Secondary | ICD-10-CM | POA: Insufficient documentation

## 2018-03-03 DIAGNOSIS — Z85528 Personal history of other malignant neoplasm of kidney: Secondary | ICD-10-CM | POA: Diagnosis not present

## 2018-03-03 DIAGNOSIS — Z79899 Other long term (current) drug therapy: Secondary | ICD-10-CM | POA: Insufficient documentation

## 2018-03-03 DIAGNOSIS — Z923 Personal history of irradiation: Secondary | ICD-10-CM | POA: Insufficient documentation

## 2018-03-03 DIAGNOSIS — Z86000 Personal history of in-situ neoplasm of breast: Secondary | ICD-10-CM | POA: Insufficient documentation

## 2018-03-03 DIAGNOSIS — M810 Age-related osteoporosis without current pathological fracture: Secondary | ICD-10-CM | POA: Diagnosis not present

## 2018-03-03 DIAGNOSIS — Z7982 Long term (current) use of aspirin: Secondary | ICD-10-CM | POA: Diagnosis not present

## 2018-03-03 DIAGNOSIS — Z9012 Acquired absence of left breast and nipple: Secondary | ICD-10-CM | POA: Diagnosis not present

## 2018-03-03 LAB — CBC WITH DIFFERENTIAL/PLATELET
BASOS ABS: 0 10*3/uL (ref 0.0–0.1)
Basophils Relative: 0 %
Eosinophils Absolute: 0.2 10*3/uL (ref 0.0–0.7)
Eosinophils Relative: 2 %
HCT: 39.8 % (ref 36.0–46.0)
HEMOGLOBIN: 12.5 g/dL (ref 12.0–15.0)
LYMPHS PCT: 14 %
Lymphs Abs: 1.4 10*3/uL (ref 0.7–4.0)
MCH: 29.8 pg (ref 26.0–34.0)
MCHC: 31.4 g/dL (ref 30.0–36.0)
MCV: 94.8 fL (ref 78.0–100.0)
Monocytes Absolute: 0.7 10*3/uL (ref 0.1–1.0)
Monocytes Relative: 7 %
Neutro Abs: 7.6 10*3/uL (ref 1.7–7.7)
Neutrophils Relative %: 77 %
PLATELETS: 110 10*3/uL — AB (ref 150–400)
RBC: 4.2 MIL/uL (ref 3.87–5.11)
RDW: 14.1 % (ref 11.5–15.5)
WBC: 9.9 10*3/uL (ref 4.0–10.5)

## 2018-03-04 ENCOUNTER — Telehealth: Payer: Self-pay | Admitting: Family Medicine

## 2018-03-04 MED ORDER — FLUTICASONE PROPIONATE 50 MCG/ACT NA SUSP: 2.0000 | Freq: Every day | NASAL | 3 refills | Status: DC

## 2018-03-04 NOTE — Telephone Encounter (Signed)
Patient requests refill on her flonase. Requests to be sent to [facility] pharmacy. Done by me. Gwen Her. Mannie Stabile, MD

## 2018-03-05 ENCOUNTER — Other Ambulatory Visit (HOSPITAL_COMMUNITY): Payer: Self-pay | Admitting: Cardiology

## 2018-03-06 ENCOUNTER — Inpatient Hospital Stay (HOSPITAL_BASED_OUTPATIENT_CLINIC_OR_DEPARTMENT_OTHER): Payer: Medicare Other | Admitting: Hematology

## 2018-03-06 ENCOUNTER — Encounter (HOSPITAL_COMMUNITY): Payer: Self-pay | Admitting: Internal Medicine

## 2018-03-06 VITALS — BP 121/44 | HR 96 | Temp 98.0°F | Resp 18 | Wt 120.6 lb

## 2018-03-06 DIAGNOSIS — Z7982 Long term (current) use of aspirin: Secondary | ICD-10-CM

## 2018-03-06 DIAGNOSIS — Z905 Acquired absence of kidney: Secondary | ICD-10-CM

## 2018-03-06 DIAGNOSIS — D696 Thrombocytopenia, unspecified: Secondary | ICD-10-CM | POA: Diagnosis not present

## 2018-03-06 DIAGNOSIS — Z8571 Personal history of Hodgkin lymphoma: Secondary | ICD-10-CM | POA: Diagnosis not present

## 2018-03-06 DIAGNOSIS — M810 Age-related osteoporosis without current pathological fracture: Secondary | ICD-10-CM

## 2018-03-06 DIAGNOSIS — Z79899 Other long term (current) drug therapy: Secondary | ICD-10-CM

## 2018-03-06 DIAGNOSIS — Z85528 Personal history of other malignant neoplasm of kidney: Secondary | ICD-10-CM

## 2018-03-06 DIAGNOSIS — Z86000 Personal history of in-situ neoplasm of breast: Secondary | ICD-10-CM | POA: Diagnosis not present

## 2018-03-06 DIAGNOSIS — Z9012 Acquired absence of left breast and nipple: Secondary | ICD-10-CM

## 2018-03-06 DIAGNOSIS — Z923 Personal history of irradiation: Secondary | ICD-10-CM

## 2018-03-06 DIAGNOSIS — Z853 Personal history of malignant neoplasm of breast: Secondary | ICD-10-CM

## 2018-03-06 NOTE — Assessment & Plan Note (Addendum)
1.  Mild thrombocytopenia: Connective tissue work-up including ANA, rheumatoid factor, lupus anticoagulant were negative.  CT scan of the abdomen and pelvis did not show any splenomegaly.  Peripheral blood smear shows giant platelets.  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  Today platelet count is 110.  We will continue to watch her closely.  She has bruising easily as she is on Brilinta.  She has one episode of rectal bleeding associated with constipation.  2.  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  She was not on tamoxifen therapy.  Today physical examination of the left mastectomy site is within normal limits.  Right breast lower inner quadrant area is slightly tender with no clearly palpable abnormality.  Her last mammogram was in November 2018 which was within normal limits.  I will go ahead and schedule her for mammogram in November.    3   Stage IIb Hodgkin's disease: She received mantle radiation in 1981. She continues to be in remission since then.  4.  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  I have discussed the findings on the CT scan which showed left kidney posterior cortex lesion which is slightly increased to 1.4 cm from 1.1 cm.  5.  Osteoporosis: Last bone density test on 12/25/2017 showed left hip T score of -3.2.  She is taking calcium and vitamin D.  She was never treated with bisphosphonates.  She follows up with endocrinologist at [facility]

## 2018-03-06 NOTE — Patient Instructions (Signed)
[facility]  Discharge Instructions:   _______________________________________________________________  Thank you for choosing [facility] at [facility] to provide your oncology and hematology care.  To afford each patient quality time with our providers, please arrive at least 15 minutes before your scheduled appointment.  You need to re-schedule your appointment if you arrive 10 or more minutes late.  We strive to give you quality time with our providers, and arriving late affects you and other patients whose appointments are after yours.  Also, if you no show three or more times for appointments you may be dismissed from the clinic.  Again, thank you for choosing  [facility] at [facility]. Our hope is that these requests will allow you access to exceptional care and in a timely manner. _______________________________________________________________  If you have questions after your visit, please contact our office at [phone] between the hours of 8:30 a.m. and 5:00 p.m. Voicemails left after 4:30 p.m. will not be returned until the following business day. _______________________________________________________________  For prescription refill requests, have your pharmacy contact our office. _______________________________________________________________  Recommendations made by the consultant and any test results will be sent to your referring physician. _______________________________________________________________ 

## 2018-03-06 NOTE — Progress Notes (Signed)
Patient Care Team: [name], MD as PCP - General (Family Medicine) Evans Lance, MD as PCP - Electrophysiology (Cardiology) Rothbart, Cristopher Estimable, MD (Cardiology) Gatha Mayer, MD (Gastroenterology) Altheimer, Legrand Como, MD (Endocrinology) Raynelle Bring, MD (Urology) [name], MD (Hematology and Oncology) [name], MD as Referring Physician (Endocrinology)  DIAGNOSIS:  Encounter Diagnoses  Name Primary?  . Personal history of breast cancer Yes  . 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  INTERVAL HISTORY: [name] [name] is seen in follow-up visit for thrombocytopenia and left breast DCIS.  She also had a history of stage IIb Hodgkin's disease in 36.  Denies any fevers, night sweats or weight loss in the last 6 months.  Does report easy bruising from Brilinta.  Denies any new onset pains other than her back pain.  She is reportedly taking calcium and vitamin D.  Denies any nosebleeds.  Denies palpating any lumps.  REVIEW OF SYSTEMS:   Constitutional: Denies fevers, chills or abnormal weight loss Eyes: Denies blurriness of vision Ears, nose, mouth, throat, and face: Denies mucositis or sore throat Respiratory: Denies cough, dyspnea or wheezes Cardiovascular: Denies palpitation, chest discomfort Gastrointestinal:  Denies nausea, heartburn or change in bowel habits Skin: Denies abnormal skin rashes.  Reports easy bruising. Lymphatics: Denies new lymphadenopathy or easy bruising Neurological:Denies numbness, tingling or new weaknesses Behavioral/Psych: Mood is stable, no new changes  Extremities: No lower extremity edema Breast:  denies any pain or lumps or nodules in her right breast All other systems were reviewed with the patient and are negative.  I have reviewed the past medical history, past surgical history, social history and family history with the patient and they are unchanged from  previous note.  ALLERGIES:  is allergic to avandia [rosiglitazone maleate]; cephalexin; clindamycin hcl; lincomycin; metformin; novolog [insulin aspart]; penicillins; tizanidine; adhesive [tape]; atorvastatin; cholestyramine; crestor [rosuvastatin]; gentamycin [gentamicin]; humalog [insulin lispro]; iodinated diagnostic agents; limonene; pravastatin; prednisone; ranexa [ranolazine]; simvastatin; strawberry extract; doxycycline; plavix [clopidogrel bisulfate]; codeine; erythromycin; hydrocodone-acetaminophen; latex; neomycin; nickel; ranitidine; tamiflu [oseltamivir]; and tramadol.  MEDICATIONS:  Current Outpatient Medications  Medication Sig Dispense Refill  . Alirocumab (PRALUENT) 150 MG/ML SOPN Inject 1 pen into the skin every 14 (fourteen) days. 2 pen 11  . ALPRAZolam (XANAX) 0.5 MG tablet Take 1 tablet (0.5 mg total) by mouth 2 (two) times daily as needed for anxiety. 60 tablet 1  . amLODipine (NORVASC) 5 MG tablet TAKE 1/2 TABLET(2.5 MG) BY MOUTH DAILY 45 tablet 0  . APIDRA 100 UNIT/ML injection Inject 46 Units into the skin as directed. INFUSE THROUGH INSULIN PUMP UTD::[place] 46.15 units daily, [place] depends on carb intake  2  . aspirin EC 81 MG tablet Take 81 mg by mouth at bedtime.    . Calcium Carbonate-Vitamin D (RA CALCIUM PLUS VITAMIN D) 600-400 MG-UNIT per tablet Take 1 tablet by mouth daily.     . Cholecalciferol 2000 UNITS TABS Take 1 tablet by mouth every morning.     . clobetasol cream (TEMOVATE) 3.14 % Apply 1 application topically 2 (two) times daily.    . Coenzyme Q10 (CO Q 10) 100 MG CAPS Take 1 capsule by mouth at bedtime.     . colchicine 0.6 MG tablet Take 0.6 mg by mouth daily as needed (gout flare).    Marland Kitchen docusate sodium (COLACE) 100 MG capsule Take 100 mg by mouth at bedtime.    . Fe Fum-FA-B Cmp-C-Zn-Mg-Mn-Cu (HEMATINIC PLUS VIT/MINERALS) 106-1  MG TABS TAKE 1 TABLET BY MOUTH THREE TIMES A WEEK(MONDAY, WEDNESDAY, AND FRIDAY) OR AS DIRECTED WITH A VITAMIN "C" 90 tablet 0  .  fluticasone (FLONASE) 50 MCG/ACT nasal spray Place 2 sprays into both nostrils daily. 48 g 3  . glucagon (GLUCAGON EMERGENCY) 1 MG injection Inject 1 mg into the vein once as needed (for severe reaction). Reported on 12/05/2015    . Icosapent Ethyl (VASCEPA) 1 g CAPS Take 2 capsules (2 g total) by mouth 2 (two) times daily. 360 capsule 3  . isosorbide mononitrate (IMDUR) 120 MG 24 hr tablet Take 1 tablet (120 mg total) by mouth daily. 90 tablet 3  . latanoprost (XALATAN) 0.005 % ophthalmic solution Place 1 drop into both eyes at bedtime.    . Magnesium Oxide 250 MG TABS Take 250 mg by mouth at bedtime.     . metolazone (ZAROXOLYN) 2.5 MG tablet Take 2.5 mg (1 tab) once as directed by CHF clinic. Call before taking [phone] 3 tablet 0  . metoprolol tartrate (LOPRESSOR) 50 MG tablet TAKE 1 AND 1/2 TABLETS(75 MG) BY MOUTH TWICE DAILY 270 tablet 2  . Multiple Vitamins-Minerals (MULTIVITAMIN WITH MINERALS) tablet Take 1 tablet by mouth every morning.     . mupirocin cream (BACTROBAN) 2 % Apply 1 application topically 2 (two) times daily as needed. 90 g 0  . nitroGLYCERIN (NITROLINGUAL) 0.4 MG/SPRAY spray PLACE 1 SPRAY UNDER THE TONGUE EVERY 5 MINUTES FOR 3 DOSES AS NEEDED FOR CHEST PAIN 14.7 g 0  . pantoprazole (PROTONIX) 40 MG tablet TAKE 1 TABLET(40 MG) BY MOUTH DAILY 30 tablet 3  . Polyethyl Glycol-Propyl Glycol (SYSTANE) 0.4-0.3 % SOLN Apply 1 drop to eye daily as needed (dry eyes).    . potassium chloride (MICRO-K) 10 MEQ CR capsule Take 4 capsules (40 mEq total) by mouth 2 (two) times daily. 720 capsule 3  . pravastatin (PRAVACHOL) 10 MG tablet TAKE 1 TABLET BY MOUTH ONCE DAILY 90 tablet 3  . spironolactone (ALDACTONE) 25 MG tablet Take 0.5 tablets (12.5 mg total) by mouth daily. 45 tablet 3  . ticagrelor (BRILINTA) 60 MG TABS tablet TAKE 1 TABLET(60 MG) BY MOUTH TWICE DAILY 180 tablet 2  . torsemide (DEMADEX) 20 MG tablet Take 22m in the AM and 451mat 3 pm. May take an extra as needed in the  PM for wt gain/swelling. 630 tablet 3  . triamcinolone cream (KENALOG) 0.5 % Apply 1 application topically daily as needed (inflamation).     . Marland KitchenLORIC 40 MG tablet Take 1 tablet (40 mg total) by mouth at bedtime. 90 tablet 3  . UNABLE TO FIND Med Name: So Clean CPAP cleaning system 1 each 0  . vitamin C (ASCORBIC ACID) 500 MG tablet Take 500 mg by mouth 3 (three) times a week.     No current facility-administered medications for this visit.     PHYSICAL EXAMINATION: ECOG PERFORMANCE STATUS: 1 - Symptomatic but completely ambulatory  Vitals:   03/06/18 1456  BP: (!) 121/44  Pulse: 96  Resp: 18  Temp: 98 F (36.7 C)  SpO2: 99%   Filed Weights   03/06/18 1456  Weight: 120 lb 9.6 oz (54.7 kg)    GENERAL:alert, no distress and comfortable SKIN: skin color, texture, turgor are normal, no rashes or significant lesions EYES: normal, Conjunctiva are pink and non-injected, sclera clear OROPHARYNX:no mucositis, no erythema and lips, buccal mucosa, and tongue normal  NECK: supple, thyroid normal size, non-tender, without nodularity LYMPH:  no palpable  lymphadenopathy in the cervical, axillary or inguinal LUNGS: clear to auscultation and percussion with normal breathing effort HEART: regular rate & rhythm and no murmurs and no lower extremity edema ABDOMEN:abdomen soft, non-tender and normal bowel sounds MUSCULOSKELETAL:no cyanosis of digits and no clubbing   EXTREMITIES: No lower extremity edema BREAST: No palpable masses in the right breast.  There is mild area of tenderness in the right breast lower inner quadrant.  Left mastectomy site is within normal limits.  Median sternotomy.  LABORATORY DATA:  I have reviewed the data as listed CMP Latest Ref Rng & Units 01/03/2018 11/07/2017 11/01/2017  Glucose 65 - 99 mg/dL 206(H) 249(H) 187(H)  BUN 6 - 20 mg/dL 53(H) 56(H) 45(H)  Creatinine 0.44 - 1.00 mg/dL 1.60(H) 1.63(H) 1.38(H)  Sodium 135 - 145 mmol/L 137 137 140  Potassium 3.5 - 5.1  mmol/L 4.4 4.2 4.6  Chloride 101 - 111 mmol/L 100(L) 97(L) 103  CO2 22 - 32 mmol/L 27 30 24   Calcium 8.9 - 10.3 mg/dL 9.9 10.3 9.9  Total Protein 6.5 - 8.1 g/dL - - 7.6  Total Bilirubin 0.3 - 1.2 mg/dL - - 0.7  Alkaline Phos 38 - 126 U/L - - 166(H)  AST 15 - 41 U/L - - 46(H)  ALT 14 - 54 U/L - - 38   No results found for: LKG401   Lab Results  Component Value Date   WBC 9.9 03/03/2018   HGB 12.5 03/03/2018   HCT 39.8 03/03/2018   MCV 94.8 03/03/2018   PLT 110 (L) 03/03/2018   NEUTROABS 7.6 03/03/2018    ASSESSMENT & PLAN:  Thrombocytopenia ([place]) 1.  Mild thrombocytopenia: Connective tissue work-up including ANA, rheumatoid factor, lupus anticoagulant were negative.  CT scan of the abdomen and pelvis did not show any splenomegaly.  Peripheral blood smear shows giant platelets.  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  Today platelet count is 110.  We will continue to watch her closely.  She has bruising easily as she is on Brilinta.  She has one episode of rectal bleeding associated with constipation.  2.  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  She was not on tamoxifen therapy.  Today physical examination of the left mastectomy site is within normal limits.  Right breast lower inner quadrant area is slightly tender with no clearly palpable abnormality.  Her last mammogram was in November 2018 which was within normal limits.  I will go ahead and schedule her for mammogram in November.    3   Stage IIb Hodgkin's disease: She received mantle radiation in 1981. She continues to be in remission since then.  4.  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  I have discussed the findings on the CT scan which showed left kidney posterior cortex lesion which is slightly increased to 1.4 cm from 1.1 cm.  5.  Osteoporosis: Last bone density test on 12/25/2017 showed left hip T score of -3.2.  She is taking calcium and vitamin D.  She  was never treated with bisphosphonates.  She follows up with endocrinologist at [facility].    Orders Placed This Encounter  Procedures  . MM DIAG BREAST TOMO UNI RIGHT    Standing Status:   Future    Standing Expiration Date:   07/06/2018    Order Specific Question:   Reason for Exam (SYMPTOM  OR DIAGNOSIS REQUIRED)    Answer:   screening    Order Specific  Question:   Preferred imaging location?    Answer:   External    Comments:   [facility]  . Comprehensive metabolic panel    Standing Status:   Future    Standing Expiration Date:   03/06/2019  . CBC with Differential    Standing Status:   Future    Standing Expiration Date:   03/06/2019  . Lactate dehydrogenase    Standing Status:   Future    Standing Expiration Date:   03/06/2019   The patient has a good understanding of the overall plan. she agrees with it. she will call with any problems that may develop before the next visit here.   Derek Jack, MD 03/06/18

## 2018-03-09 ENCOUNTER — Encounter: Payer: Self-pay | Admitting: Family Medicine

## 2018-03-10 ENCOUNTER — Other Ambulatory Visit (HOSPITAL_COMMUNITY): Payer: Self-pay | Admitting: *Deleted

## 2018-03-10 MED ORDER — PANTOPRAZOLE SODIUM 40 MG PO TBEC: DELAYED_RELEASE_TABLET | ORAL | 3 refills | Status: DC

## 2018-03-10 MED ORDER — PRAVASTATIN SODIUM 10 MG PO TABS: 10.0000 mg | ORAL_TABLET | Freq: Every day | ORAL | 3 refills | Status: DC

## 2018-03-17 ENCOUNTER — Telehealth: Payer: Self-pay | Admitting: Cardiology

## 2018-03-17 NOTE — Telephone Encounter (Signed)
Download sent to scan

## 2018-03-17 NOTE — Telephone Encounter (Signed)
Please get a dowlnoad

## 2018-03-17 NOTE — Telephone Encounter (Signed)
New Message   Patient is calling because she received an email from the company that monitors her CPAP about the number of events she is having. She says sometimes that it can be about 20-25 events per hour. She is not sure if she should be alarmed or needs to see Dr. Radford Pax. Please call to discuss.

## 2018-03-20 ENCOUNTER — Other Ambulatory Visit: Payer: Self-pay | Admitting: Cardiology

## 2018-03-21 ENCOUNTER — Telehealth: Payer: Self-pay | Admitting: *Deleted

## 2018-03-21 NOTE — Telephone Encounter (Signed)
Patient has a dream wear nasal pillow size small but she started out using one that looks like a triangle but it kept her nose red and irritated, Patient states she starts out on her side but by morning she wakes up is on her back.

## 2018-03-21 NOTE — Telephone Encounter (Signed)
-----   Message from Sueanne Margarita, MD sent at 03/18/2018  7:52 PM EDT ----- Shary Key is too high - please find out what mask she wears and if she sleeps supine

## 2018-04-14 ENCOUNTER — Emergency Department (HOSPITAL_COMMUNITY)
Admission: EM | Admit: 2018-04-14 | Discharge: 2018-04-14 | Disposition: A | Discharge status: Home / Self Care | Payer: Medicare Other | Attending: Emergency Medicine | Admitting: Emergency Medicine

## 2018-04-14 ENCOUNTER — Encounter (HOSPITAL_COMMUNITY): Payer: Self-pay | Admitting: Emergency Medicine

## 2018-04-14 ENCOUNTER — Telehealth (HOSPITAL_COMMUNITY): Payer: Self-pay

## 2018-04-14 ENCOUNTER — Emergency Department (HOSPITAL_COMMUNITY): Payer: Medicare Other

## 2018-04-14 DIAGNOSIS — Z91040 Latex allergy status: Secondary | ICD-10-CM | POA: Diagnosis not present

## 2018-04-14 DIAGNOSIS — I5032 Chronic diastolic (congestive) heart failure: Secondary | ICD-10-CM | POA: Insufficient documentation

## 2018-04-14 DIAGNOSIS — Z951 Presence of aortocoronary bypass graft: Secondary | ICD-10-CM | POA: Insufficient documentation

## 2018-04-14 DIAGNOSIS — E1122 Type 2 diabetes mellitus with diabetic chronic kidney disease: Secondary | ICD-10-CM | POA: Diagnosis not present

## 2018-04-14 DIAGNOSIS — I2510 Atherosclerotic heart disease of native coronary artery without angina pectoris: Secondary | ICD-10-CM | POA: Diagnosis not present

## 2018-04-14 DIAGNOSIS — Z853 Personal history of malignant neoplasm of breast: Secondary | ICD-10-CM | POA: Insufficient documentation

## 2018-04-14 DIAGNOSIS — Z9641 Presence of insulin pump (external) (internal): Secondary | ICD-10-CM | POA: Insufficient documentation

## 2018-04-14 DIAGNOSIS — I130 Hypertensive heart and chronic kidney disease with heart failure and stage 1 through stage 4 chronic kidney disease, or unspecified chronic kidney disease: Secondary | ICD-10-CM | POA: Insufficient documentation

## 2018-04-14 DIAGNOSIS — N182 Chronic kidney disease, stage 2 (mild): Secondary | ICD-10-CM | POA: Diagnosis not present

## 2018-04-14 DIAGNOSIS — R1084 Generalized abdominal pain: Secondary | ICD-10-CM | POA: Insufficient documentation

## 2018-04-14 DIAGNOSIS — I480 Paroxysmal atrial fibrillation: Secondary | ICD-10-CM | POA: Diagnosis not present

## 2018-04-14 DIAGNOSIS — K625 Hemorrhage of anus and rectum: Secondary | ICD-10-CM | POA: Diagnosis present

## 2018-04-14 DIAGNOSIS — Z79899 Other long term (current) drug therapy: Secondary | ICD-10-CM | POA: Diagnosis not present

## 2018-04-14 DIAGNOSIS — Z950 Presence of cardiac pacemaker: Secondary | ICD-10-CM | POA: Insufficient documentation

## 2018-04-14 LAB — CBC
HCT: 41.8 % (ref 36.0–46.0)
Hemoglobin: 13.4 g/dL (ref 12.0–15.0)
MCH: 29.7 pg (ref 26.0–34.0)
MCHC: 32.1 g/dL (ref 30.0–36.0)
MCV: 92.7 fL (ref 78.0–100.0)
PLATELETS: 129 10*3/uL — AB (ref 150–400)
RBC: 4.51 MIL/uL (ref 3.87–5.11)
RDW: 13.2 % (ref 11.5–15.5)
WBC: 11.4 10*3/uL — AB (ref 4.0–10.5)

## 2018-04-14 LAB — CBG MONITORING, ED: Glucose-Capillary: 257 mg/dL — ABNORMAL HIGH (ref 70–99)

## 2018-04-14 LAB — COMPREHENSIVE METABOLIC PANEL
ALBUMIN: 4.5 g/dL (ref 3.5–5.0)
ALK PHOS: 144 U/L — AB (ref 38–126)
ALT: 32 U/L (ref 0–44)
AST: 45 U/L — AB (ref 15–41)
Anion gap: 14 (ref 5–15)
BILIRUBIN TOTAL: 0.9 mg/dL (ref 0.3–1.2)
BUN: 52 mg/dL — AB (ref 8–23)
CALCIUM: 10.2 mg/dL (ref 8.9–10.3)
CO2: 26 mmol/L (ref 22–32)
CREATININE: 1.65 mg/dL — AB (ref 0.44–1.00)
Chloride: 94 mmol/L — ABNORMAL LOW (ref 98–111)
GFR calc Af Amer: 37 mL/min — ABNORMAL LOW (ref >60)
GFR, EST NON AFRICAN AMERICAN: 32 mL/min — AB (ref >60)
GLUCOSE: 284 mg/dL — AB (ref 70–99)
Potassium: 4.4 mmol/L (ref 3.5–5.1)
Sodium: 134 mmol/L — ABNORMAL LOW (ref 135–145)
TOTAL PROTEIN: 8.7 g/dL — AB (ref 6.5–8.1)

## 2018-04-14 LAB — PROTIME-INR
INR: 1.1
Prothrombin Time: 14.1 seconds (ref 11.4–15.2)

## 2018-04-14 LAB — POC OCCULT BLOOD, ED: Fecal Occult Bld: POSITIVE — AB

## 2018-04-14 LAB — TYPE AND SCREEN
ABO/RH(D): O POS
ANTIBODY SCREEN: NEGATIVE

## 2018-04-14 LAB — APTT: APTT: 35 s (ref 24–36)

## 2018-04-14 NOTE — Discharge Instructions (Signed)
Be sure to stay well-hydrated.  Follow-up with the gastroenterologist, as planned.  Return to the ED for any worsening symptoms.

## 2018-04-14 NOTE — ED Triage Notes (Addendum)
Pt states in 2014 she was told she has a slow GI bleed and has occasionally seen blood in stool since then. 2 weeks ago she had a lot of blood. It stopped, then last Friday had sudden cramping and a large amount of mucous blood in stool. Pt states she also had a small amount of blood today. Hx of anemia. She takes brillenta and asprin. She noted she had a scrape on her shoulder that would not stop bleeding and when she changed her insulin pump today it also was slow to stop bleeding, her CBGS have been increasingly elevated as well.

## 2018-04-14 NOTE — Telephone Encounter (Signed)
Agree with evaluation, needs CBC and may need GI involvement.

## 2018-04-14 NOTE — ED Notes (Signed)
Pt alert and oriented in NAD. Pt verbalized understanding of discharge instructions.

## 2018-04-14 NOTE — Telephone Encounter (Signed)
Pt called to state she is having usual bleeding from her rectum and a should pore which left blood on her sheet during the night 09/22. Pt also states her glucose today was 400 and she changed her insulin and the site continued to bleed. Pt is experiencing bloody stools for the past 3 days.   I advised pt to go to ER. Pt is on the way to [facility] for treatment.

## 2018-04-14 NOTE — ED Provider Notes (Signed)
[facility] EMERGENCY DEPARTMENT Provider Note   CSN: [id number] Arrival date & time: 04/14/18  1603     History   Chief Complaint Chief Complaint  Patient presents with  . GI Bleeding    HPI [name] [name] is a 67 y.o. female.  HPI   [name] [name] is a 67 y.o. female, with a history of ischemic colitis in 2008 and 2010, DM, CAD, PAF, presenting to the ED with bright red rectal bleeding.  Three days ago, she had abdominal cramping and then had a bowel movement with mucous and blood mixed throughout.  Two weeks ago, she endorse more blood than normal in her stool (typically has a couple drops every few weeks). Has a few drops on the paper today. No BMs for last two days.  More bleeding than normal from wounds and insulin pump site, as well as epistaxis three days ago.  She will have abdominal cramping before a BM, but then resolves.  Denies shortness of breath, chest pain, syncope, fever/chills, pain with bowel movements, melena, or any other complaints. Gastroenterologist: Dr. Derrill Kay with [facility]. Has seen Dr. Henrene Pastor in the past as well. States her last EGD was 2014, which she states had no acute abnormalities. Polyps on last colonoscopy in 2018.  Has follow up appt with Dr. Derrill Kay, [facility], 9/25.   Past Medical History:  Diagnosis Date  . Anxiety   . Aortic stenosis    a. bicuspid aortic valve; mean gradient of 20 mmHg in 2/10; b. s/p bioprosth AVR (19-mm Edwards pericardial valve) with a redo coronary artery bypass graft procedure in 07/2009; postoperative Dressler's syndrome    . Arthritis   . Breast cancer ([place])   . Carcinoma, renal cell ([place]) 05/2007   Laparoscopic right nephrectomy  . 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 Chronic diastolic CHF (congestive heart failure) ([place])    a. 9.2014 EchoP EF 60-65%, no rwma, bioprosth AVR, mean gradient of 39mHg, mild to mod MR, PASP 526mg.  . CKD (chronic kidney disease), stage II   . Colitis,  ischemic ([place]2008  . Coronary artery disease    a. initial CABG with SVG-RCA in 12/05. b. redo SVG-RCA and bioprosthetic AVR in 1/11. d. 9/14 - PCI with DES to distal LM/proximal LAD was done rather than bypass as she would have been a poor candidate for redo sternotomy. d. S/p DES to prox LM 05/2014.  . Diabetes mellitus 03/2010    TYPE II: Hemoglobin A1c of 7.4 in  03/2010; 8.4 in 06/2010; treated with insulin pump  . Duodenal ulcer    Remote; H. pylori positive  . Gastroparesis due to DM (HCEast [place]10/04/2012  . Genetic testing 01/14/2017   Ms. Agresti underwent genetic counseling and testing for hereditary cancer syndromes on 01/01/2017. Her results were negative for mutations in all 83 genes analyzed by [employer]'s 83-gene Common Hereditary Cancers Panel. Genes analyzed include: ALK, APC, ATM, AXIN2, BAP1, BARD1, BLM, BMPR1A, BRCA1, BRCA2, BRIP1, CASR, CDC73, CDH1, CDK4, CDKN1B, CDKN1C, CDKN2A, CEBPA, CHEK2, CTNNA1, DICER1, DIS3L2,   . Hodgkin's disease ([place]1991   [place]le radiation therapy  . Hodgkin's disease ([place]  . Hyperlipidemia   . Hyperplastic gastric polyp 06/23/2012  . Hypertension   . Iron deficiency anemia    a. 03/2013 EGD: essentially normal. Possible slow GIB.  . Migraines   . NASH (nonalcoholic steatohepatitis) 1999   -biopsy in 1999  . OSA on CPAP 04/24/2017   Moderate with AHI 22/hr  now on CPAP at 18cm H2o  . Osteopenia     hip on DEXA in October 2007.  . Osteoporosis   . Oxygen deficiency   . Pacemaker    six sinus syndrome  . PAF (paroxysmal atrial fibrillation) ([place])    a.  h/o brief episodes noted on ppm interrogation. b. given GI bleeding and need to be on both ASA 81 and Brilinta, risks likely outweigh benefits of anticoagulation.   . Small bowel obstruction (HCC)    Recurrent; resolved after resection of a lipoma  . Syncope    a. H/o recurrent syncope with pauses on loop recorder s/p Medtronic pacemaker 07/2012.    Patient Active Problem List   Diagnosis Date  Noted  . OSA on CPAP 04/24/2017  . Genetic testing 01/14/2017  . Insulin pump status 09/12/2016  . S/P left mastectomy 07/29/2016  . History of breast cancer 03/29/2016  . Dense breast 12/11/2015  . Chronic anticoagulation 12/11/2015  . Hx of radiation therapy 04/23/2015  . Thrombocytopenia ([place]) 04/23/2015  . 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 Radiation therapy complication 61/60/7371  . History of nodular sclerosis Hodgkin's disease 10/27/2014  . Insulin dependent type 1 diabetes mellitus ([place]) 10/07/2014  . Claudication ([place]) 07/29/2014  . PAF (paroxysmal atrial fibrillation) ([place])   . Coronary artery disease involving native coronary artery of native heart with unstable angina pectoris ([place])   . Unstable angina ([place]) 04/11/2013  . Chronic diastolic CHF (congestive heart failure) ([place]) 04/03/2013  . Artificial cardiac pacemaker 03/12/2013  . Chronic kidney disease (CKD), stage III (moderate) ([place]) 03/12/2013  . Big thyroid 03/12/2013  . Encounter for fitting or adjustment of insulin pump 03/12/2013  . Congestive heart failure ([place]) 03/12/2013  . Lipoma of neck 09/23/2012  . Pacemaker 09/08/2012  . Atrial flutter ([place]) 07/23/2012  . Sick sinus syndrome ([place]) 07/21/2012  . Gastroparesis due to DM ([place]) 05/01/2012  . Hypercalcemia   . Cholelithiasis 07/20/2011  . Aortic stenosis status post 19 mm Edwards pericardial valve replacement 2011   . Arteriosclerotic cardiovascular disease (ASCVD)   . Osteopenia   . NASH (nonalcoholic steatohepatitis)   . Renal cell carcinoma (HCC)     Class: History of  . Duodenal ulcer     Class: History of  . Ischemic colitis     Class: History of  . Gastroesophageal reflux disease   . Hypertension   . Small bowel obstruction ([place])   . Hyperlipidemia 04/11/2010  . Colonic polyps, adenomatous 09/29/2009    Past Surgical History:  Procedure  Laterality Date  . ABDOMINAL HYSTERECTOMY    . AORTIC VALVE REPLACEMENT  2011   Aortic valve replacement surgery with a 19 mm bioprosthetic device post redo CABG January 2011.  Marland Kitchen BONE MARROW BIOPSY    . BREAST EXCISIONAL BIOPSY     benign, bilateral  . CARDIAC CATHETERIZATION    . CERVICAL BIOPSY    . COLONOSCOPY     Multiple with adenomatous polyps  . COLONOSCOPY  06/23/2012   Procedure: COLONOSCOPY;  Surgeon: Gatha Mayer, MD;  Location: [facility] ENDOSCOPY;  Service: Endoscopy;  Laterality: N/A;  . CORONARY ARTERY BYPASS GRAFT  2005, 2011   CABG-most recent SVG to RCA-12/05; RCA occluded with patent graft in 2006; Redo bypass in 07/2009  . ESOPHAGOGASTRODUODENOSCOPY    . ESOPHAGOGASTRODUODENOSCOPY  06/23/2012   Procedure: ESOPHAGOGASTRODUODENOSCOPY (EGD);  Surgeon: Gatha Mayer, MD;  [facility];  Service: Endoscopy;  Laterality: N/A;  . ESOPHAGOGASTRODUODENOSCOPY N/A 04/06/2013   Procedure: ESOPHAGOGASTRODUODENOSCOPY (EGD);  Surgeon: Irene Shipper, MD;  Location: [facility] ENDOSCOPY;  Service: Endoscopy;  Laterality: N/A;  . EXPLORATORY LAPAROTOMY W/ BOWEL RESECTION     Small bowel resection of lipoma  . GIVENS CAPSULE STUDY N/A 04/30/2013   Procedure: GIVENS CAPSULE STUDY;  Surgeon: Gatha Mayer, MD;  Location: [facility];  Service: Endoscopy;  Laterality: N/A;  . LEFT AND RIGHT HEART CATHETERIZATION WITH CORONARY ANGIOGRAM N/A 04/07/2013   Procedure: LEFT AND RIGHT HEART CATHETERIZATION WITH CORONARY ANGIOGRAM;  Surgeon: Larey Dresser, MD;  Location: [facility] CATH LAB;  Service: Cardiovascular;  Laterality: N/A;  . LEFT ATRIAL APPENDAGE OCCLUSION N/A 02/16/2016   Watchman not placed - nickel allergy  . LEFT HEART CATHETERIZATION WITH CORONARY/GRAFT ANGIOGRAM N/A 06/07/2014   Procedure: LEFT HEART CATHETERIZATION WITH Beatrix Fetters;  Surgeon: Burnell Blanks, MD;  Location: [facility] CATH LAB;  Service: Cardiovascular;  Laterality: N/A;  . LIVER BIOPSY    . LOOP RECORDER  IMPLANT N/A 12/10/2011   Procedure: LOOP RECORDER IMPLANT;  Surgeon: Evans Lance, MD;  Location: [facility] CATH LAB;  Service: Cardiovascular;  Laterality: N/A;  . MALONEY DILATION  06/23/2012   Procedure: [name] DILATION;  Surgeon: Gatha Mayer, MD;  Location: [facility] ENDOSCOPY;  Service: Endoscopy;;  54 fr  . MASTECTOMY Left 05/17/2015   total   . NEPHRECTOMY  2008   Laparoscopic right nephrectomy, renal cell carcinoma  . PACEMAKER INSERTION  08/21/2012   Medtronic Adapta L dual-chamber pacemaker  . PERCUTANEOUS CORONARY STENT INTERVENTION (PCI-S) N/A 04/09/2013   Procedure: PERCUTANEOUS CORONARY STENT INTERVENTION (PCI-S);  Surgeon: Burnell Blanks, MD;  Location: [facility] C[facility] LAB;  Service: Cardiovascular;  Laterality: N/A;  . PERMANENT PACEMAKER INSERTION N/A 08/21/2012   Procedure: PERMANENT PACEMAKER INSERTION;  Surgeon: Evans Lance, MD;  Location: [facility] CATH LAB;  Service: Cardiovascular;  Laterality: N/A;  . RIGHT HEART CATH AND CORONARY/GRAFT ANGIOGRAPHY N/A 04/19/2017   Procedure: RIGHT HEART CATH AND CORONARY/GRAFT ANGIOGRAPHY;  Surgeon: Larey Dresser, MD;  Location: [facility] CV LAB;  Service: Cardiovascular;  Laterality: N/A;  . TEE WITHOUT CARDIOVERSION N/A 12/07/2015   Procedure: TRANSESOPHAGEAL ECHOCARDIOGRAM (TEE);  Surgeon: Larey Dresser, MD;  Location: [facility];  Service: Cardiovascular;  Laterality: N/A;  . TOTAL ABDOMINAL HYSTERECTOMY W/ BILATERAL SALPINGOOPHORECTOMY  2000   endometriosis and ovarian cysts  . TOTAL MASTECTOMY Left 05/17/2015   Procedure: LEFT TOTAL MASTECTOMY;  Surgeon: Rolm Bookbinder, MD;  Location: [facility];  Service: General;  Laterality: Left;  . TUMOR EXCISION  1981   removal of Hodgkins lymphoma     OB History   None      Home Medications    Prior to Admission medications   Medication Sig Start Date End Date Taking? Authorizing Provider  Alirocumab (PRALUENT) 150 MG/ML SOPN Inject 1 pen into the skin every 14 (fourteen) days. 08/23/17    Larey Dresser, MD  ALPRAZolam Duanne Moron) 0.5 MG tablet Take 1 tablet (0.5 mg total) by mouth 2 (two) times daily as needed for anxiety. 01/15/18   [name], MD  amLODipine (NORVASC) 5 MG tablet TAKE 1/2 TABLET(2.5 MG) BY MOUTH DAILY 11/26/17   Larey Dresser, MD  APIDRA 100 UNIT/ML injection Inject 46 Units into the skin as directed. INFUSE THROUGH INSULIN PUMP UTD::[place] 46.15 units daily, Bolus depends on carb intake 02/04/15   [provider]  aspirin EC 81 MG tablet Take 81  mg by mouth at bedtime.    [provider]  Calcium Carbonate-Vitamin D (RA CALCIUM PLUS VITAMIN D) 600-400 MG-UNIT per tablet Take 1 tablet by mouth daily.     [provider]  Cholecalciferol 2000 UNITS TABS Take 1 tablet by mouth every morning.     [provider]  clobetasol cream (TEMOVATE) 6.71 % Apply 1 application topically 2 (two) times daily.    [provider]  Coenzyme Q10 (CO Q 10) 100 MG CAPS Take 1 capsule by mouth at bedtime.     [provider]  colchicine 0.6 MG tablet Take 0.6 mg by mouth daily as needed (gout flare).    [provider]  docusate sodium (COLACE) 100 MG capsule Take 100 mg by mouth at bedtime.    [provider]  Fe Fum-FA-B Cmp-C-Zn-Mg-Mn-Cu (HEMATINIC PLUS VIT/MINERALS) 106-1 MG TABS TAKE 1 TABLET BY MOUTH THREE TIMES A WEEK(MONDAY, WEDNESDAY, AND FRIDAY) OR AS DIRECTED WITH A VITAMIN "C" 12/17/17   Nicholas Lose, MD  fluticasone (FLONASE) 50 MCG/ACT nasal spray Place 2 sprays into both nostrils daily. 03/04/18   [name], MD  glucagon (GLUCAGON EMERGENCY) 1 MG injection Inject 1 mg into the vein once as needed (for severe reaction). Reported on 12/05/2015    [provider]  Icosapent Ethyl (VASCEPA) 1 g CAPS Take 2 capsules (2 g total) by mouth 2 (two) times daily. 02/03/18   Larey Dresser, MD  isosorbide mononitrate (IMDUR) 120 MG 24 hr tablet Take 1 tablet (120 mg total) by mouth daily. 01/28/18    Larey Dresser, MD  latanoprost (XALATAN) 0.005 % ophthalmic solution Place 1 drop into both eyes at bedtime.    [provider]  Magnesium Oxide 250 MG TABS Take 250 mg by mouth at bedtime.     [provider]  metolazone (ZAROXOLYN) 2.5 MG tablet Take 2.5 mg (1 tab) once as directed by CHF clinic. Call before taking [phone] 03/06/17   Larey Dresser, MD  metoprolol tartrate (LOPRESSOR) 50 MG tablet TAKE 1 AND 1/2 TABLETS(75 MG) BY MOUTH TWICE DAILY 10/07/17   Evans Lance, MD  Multiple Vitamins-Minerals (MULTIVITAMIN WITH MINERALS) tablet Take 1 tablet by mouth every morning.     [provider]  mupirocin cream (BACTROBAN) 2 % Apply 1 application topically 2 (two) times daily as needed. 02/11/18   [name], MD  nitroGLYCERIN (NITROLINGUAL) 0.4 MG/SPRAY spray PLACE 1 SPRAY UNDER THE TONGUE EVERY 5 MINUTES FOR 3 DOSES AS NEEDED FOR CHEST PAIN 01/27/18   Larey Dresser, MD  pantoprazole (PROTONIX) 40 MG tablet TAKE 1 TABLET(40 MG) BY MOUTH DAILY 03/10/18   Larey Dresser, MD  Polyethyl Glycol-Propyl Glycol (SYSTANE) 0.4-0.3 % SOLN Apply 1 drop to eye daily as needed (dry eyes).    [provider]  potassium chloride (MICRO-K) 10 MEQ CR capsule Take 4 capsules (40 mEq total) by mouth 2 (two) times daily. 11/19/17   Larey Dresser, MD  pravastatin (PRAVACHOL) 10 MG tablet Take 1 tablet (10 mg total) by mouth daily. 03/10/18   Larey Dresser, MD  pravastatin (PRAVACHOL) 10 MG tablet TAKE 1 TABLET BY MOUTH ONCE DAILY 03/20/18   Larey Dresser, MD  spironolactone (ALDACTONE) 25 MG tablet Take 0.5 tablets (12.5 mg total) by mouth daily. 09/10/17   Larey Dresser, MD  ticagrelor (BRILINTA) 60 MG TABS tablet TAKE 1 TABLET(60 MG) BY MOUTH TWICE DAILY 04/29/17   Larey Dresser, MD  torsemide (  DEMADEX) 20 MG tablet Take 1m in the AM and 464mat 3 pm. May take an extra as needed in the PM for wt gain/swelling. 02/10/18   McLarey DresserMD    triamcinolone cream (KENALOG) 0.5 % Apply 1 application topically daily as needed (inflamation).     [provider]  ULORIC 40 MG tablet Take 1 tablet (40 mg total) by mouth at bedtime. 11/29/17   HaCaren MacadamMD  UNABLE TO FIND Med Name: So Clean CPAP cleaning system 09/24/17   HaCaren MacadamMD  vitamin C (ASCORBIC ACID) 500 MG tablet Take 500 mg by mouth 3 (three) times a week.    [provider]    Family History Family History  Problem Relation Age of Onset  . Heart attack Mother        d.61  . Heart disease Mother   . Heart attack Father        d.60  . Diabetes Father   . Heart disease Father   . Lung cancer Sister        d.61 metastases to brain. History of smoking. Maternal half-sister.  . Cancer Sister 30       unspecified gynecologic cancer (either cervical or ovarian) in her 3036s . Marland Kitcheneart attack Maternal Grandmother   . Cancer Maternal Grandfather [id number]     d.52s oral cancer  . Hypertension Brother   . Glaucoma Brother   . Breast cancer Sister 4159     d.42 from metastatic disease. Paternal half-sister.  . Colon cancer Neg Hx   . Stomach cancer Neg Hx     Social History Social History   Tobacco Use  . Smoking status: Never Smoker  . Smokeless tobacco: Never Used  Substance Use Topics  . Alcohol use: No    Alcohol/week: 0.0 standard drinks  . Drug use: No     Allergies   Avandia [rosiglitazone maleate]; Cephalexin; Clindamycin hcl; Lincomycin; Metformin; Novolog [insulin aspart]; Penicillins; Tizanidine; Adhesive [tape]; Atorvastatin; Cholestyramine; Crestor [rosuvastatin]; Gentamycin [gentamicin]; Humalog [insulin lispro]; Iodinated diagnostic agents; Limonene; Pravastatin; Prednisone; Ranexa [ranolazine]; Simvastatin; Strawberry extract; Doxycycline; Plavix [clopidogrel bisulfate]; Codeine; Erythromycin; Hydrocodone-acetaminophen; Latex; Neomycin; Nickel; Ranitidine; Tamiflu [oseltamivir]; and Tramadol   Review of Systems Review of  Systems  Constitutional: Negative for chills, diaphoresis and fever.  Respiratory: Negative for shortness of breath.   Cardiovascular: Negative for chest pain.  Gastrointestinal: Positive for blood in stool. Negative for abdominal pain (none currently), diarrhea, nausea and vomiting.  Musculoskeletal: Negative for back pain.  Neurological: Negative for dizziness, weakness and light-headedness.  All other systems reviewed and are negative.    Physical Exam Updated Vital Signs BP 133/65 (BP Location: Right Arm)   Pulse 74   Temp 97.8 F (36.6 C) (Oral)   Resp 16   SpO2 100%   Physical Exam  Constitutional: She appears well-developed and well-nourished. No distress.  HENT:  Head: Normocephalic and atraumatic.  Eyes: Conjunctivae are normal.  Neck: Neck supple.  Cardiovascular: Normal rate, regular rhythm, normal heart sounds and intact distal pulses.  Pulmonary/Chest: Effort normal and breath sounds normal. No respiratory distress.  Abdominal: Soft. There is no tenderness. There is no guarding.  Genitourinary: Rectal exam shows guaiac positive stool.  Genitourinary Comments: No external hemorrhoids, fissures, or lesions noted. No gross blood or melena. Some stool in rectal vault. Stool appears to be brown. No rectal tenderness. No foreign bodies noted. RN, ElBenjamine Molaserved as chaperone during the rectal exam.  Musculoskeletal:  She exhibits no edema.  Lymphadenopathy:    She has no cervical adenopathy.  Neurological: She is alert.  Skin: Skin is warm and dry. She is not diaphoretic.  Patient has a insulin pump site on right abdomen that continues to ooze blood.  No noted tenderness, swelling, bruising.  Psychiatric: She has a normal mood and affect. Her behavior is normal.  Nursing note and vitals reviewed.    ED Treatments / Results  Labs (all labs ordered are listed, but only abnormal results are displayed) Labs Reviewed  COMPREHENSIVE METABOLIC PANEL - Abnormal; Notable  for the following components:      Result Value   Sodium 134 (*)    Chloride 94 (*)    Glucose, Bld 284 (*)    BUN 52 (*)    Creatinine, Ser 1.65 (*)    Total Protein 8.7 (*)    AST 45 (*)    Alkaline Phosphatase 144 (*)    GFR calc non Af Amer 32 (*)    GFR calc Af Amer 37 (*)    All other components within normal limits  CBC - Abnormal; Notable for the following components:   WBC 11.4 (*)    Platelets 129 (*)    All other components within normal limits  POC OCCULT BLOOD, ED - Abnormal; Notable for the following components:   Fecal Occult Bld POSITIVE (*)    All other components within normal limits  CBG MONITORING, ED - Abnormal; Notable for the following components:   Glucose-Capillary 257 (*)    All other components within normal limits  PROTIME-INR  APTT  TYPE AND SCREEN    EKG None  Radiology Ct Abdomen Pelvis Wo Contrast  Result Date: 04/14/2018 CLINICAL DATA:  Blood in stool. History of RIGHT nephrectomy, breast and renal cancer, hysterectomy, bowel resection, duodenal ulcer. EXAM: CT ABDOMEN AND PELVIS WITHOUT CONTRAST TECHNIQUE: Multidetector CT imaging of the abdomen and pelvis was performed following the standard protocol without IV contrast. COMPARISON:  CT abdomen and pelvis October 14, 2017 FINDINGS: LOWER CHEST: Similar RIGHT pleural thickening. Heart size is normal. Severe coronary artery calcifications in cardiac pacemaker lead. No pericardial effusion. The visualized heart size is normal. No pericardial effusion. HEPATOBILIARY: Multiple punctate layering cholelithiasis without CT findings of acute cholecystitis. Hepatic liver is not suspicious. Hepatic granuloma. PANCREAS: Normal. SPLEEN: Normal. ADRENALS/URINARY TRACT: Status post RIGHT nephrectomy. Mild LEFT compensatory nephromegaly with LEFT extrarenal pelvis, no hydronephrosis. Punctate LEFT lower pole nephrolithiasis. Stable 14 mm mildly dense exophytic mass (30 Hounsfield units). No nephrolithiasis. No  nephrolithiasis, hydronephrosis; limited assessment for renal masses by nonenhanced CT. Urinary bladder is well distended and unremarkable. Normal adrenal glands. STOMACH/BOWEL: The stomach, small and large bowel are normal in caliber without inflammatory changes, sensitivity decreased by lack of enteric contrast. Mild colonic diverticulosis. Again noted is wide necked RIGHT lumbar hernia (5.9 cm) containing cecum and ascending colon. Normal air-filled appendix. VASCULAR/LYMPHATIC: Aortoiliac vessels are normal in course and caliber. Moderate calcific atherosclerosis. No lymphadenopathy by CT size criteria. REPRODUCTIVE: Status post hysterectomy. OTHER: No intraperitoneal free fluid or free air. MUSCULOSKELETAL: Non-acute. LEFT mastectomy. Mild rectus abdominus diastasis with small fat containing paraumbilical hernia. IMPRESSION: 1. Mild colonic diverticulosis without acute diverticulitis. Redemonstration of wide necked RIGHT hernia containing cecum and ascending colon without CT findings of strangulation. 2. Status post RIGHT nephrectomy. Punctate nonobstructing LEFT nephrolithiasis. 3. Stable appearance of 14 mm LEFT renal previously characterized Bosniak 2 lesion. Again, recommend follow-up. 4. Cholelithiasis without acute cholecystitis. Aortic Atherosclerosis (ICD10-I70.0).  Electronically Signed   By: Elon Alas M.D.   On: 04/14/2018 21:25    Procedures Procedures (including critical care time)  Medications Ordered in ED Medications - No data to display   Initial Impression / Assessment and Plan / ED Course  I have reviewed the triage vital signs and the nursing notes.  Pertinent labs & imaging results that were available during my care of the patient were reviewed by me and considered in my medical decision making (see chart for details).  Clinical Course as of Apr 15 4  Mon Apr 14, 2018  2207 Consistent with previous values.  Creatinine(!): 1.65 [SJ]  2208 Consistent with previous  values.  BUN(!): 52 [SJ]  2210 Consistent with previous values.  Platelets(!): 129 [SJ]    Clinical Course User Index [SJ] Loyce Klasen C, PA-C    Patient presents with bright red rectal bleeding. Patient is nontoxic appearing, afebrile, not tachycardic, not tachypneic, not hypotensive, maintains excellent SPO2 on room air, and is in no apparent distress.  No frank blood on exam.  Normal hemoglobin.  CT without acute abnormalities.  Patient has close GI follow-up.  Return precautions discussed.  Patient voices understanding of these instructions, accepts the plan, and is comfortable with discharge.  Findings and plan of care discussed with Lennice Sites, DO. Dr. Ronnald Nian personally evaluated and examined this patient.   Vitals:   04/14/18 2100 04/14/18 2115 04/14/18 2130 04/14/18 2145  BP: 122/70 125/65 121/60 125/72  Pulse: 76 73 74 76  Resp: (!) 22 16 13 18   Temp:      TempSrc:      SpO2: 99% 98% 98% 97%     Final Clinical Impressions(s) / ED Diagnoses   Final diagnoses:  Rectal bleeding    ED Discharge Orders    None       Layla Maw 04/15/18 0005    Lennice Sites, DO 04/15/18 0103

## 2018-04-14 NOTE — ED Provider Notes (Signed)
MSE was initiated and I personally evaluated the patient and placed orders (if any) at  4:33 PM on April 14, 2018.  The patient appears stable so that the remainder of the MSE may be completed by another provider.  Patient placed in Quick Look pathway, seen and evaluated   Chief Complaint: blood in stool  HPI:   67yo F with a pmh of CHF, CKD, CAD, RCC, T1DM, who presents to ED for bleeding. States that since Friday, she's had several episodes of blood in stool. Since yesterday has noticed that even small cuts on her body have trouble clotting. She reports compliance with home ASA and brillinta. Reports CBGs have been running high. Had one episode of bloody stool today. Denies lightheadedness, chest pain.  ROS: blood in stool (one)  Physical Exam:   Gen: No distress  Neuro: Awake and Alert  Skin: Warm    Focused Exam: TTP of upper abdomen with no rebound or guarding.   Initiation of care has begun. The patient has been counseled on the process, plan, and necessity for staying for the completion/evaluation, and the remainder of the medical screening examination   Delia Heady, PA-C 04/14/18 1642    Dorie Rank, MD 04/14/18 2322

## 2018-04-15 ENCOUNTER — Encounter (HOSPITAL_COMMUNITY): Payer: Self-pay

## 2018-04-18 ENCOUNTER — Ambulatory Visit (HOSPITAL_COMMUNITY)
Admission: RE | Admit: 2018-04-18 | Discharge: 2018-04-18 | Disposition: A | Discharge status: Home / Self Care | Payer: Medicare Other | Source: Ambulatory Visit | Attending: Cardiology | Admitting: Cardiology

## 2018-04-18 ENCOUNTER — Encounter (HOSPITAL_COMMUNITY): Payer: Self-pay

## 2018-04-18 VITALS — BP 120/62 | HR 75 | Wt 118.6 lb

## 2018-04-18 DIAGNOSIS — N183 Chronic kidney disease, stage 3 unspecified: Secondary | ICD-10-CM

## 2018-04-18 DIAGNOSIS — I5032 Chronic diastolic (congestive) heart failure: Secondary | ICD-10-CM | POA: Diagnosis not present

## 2018-04-18 DIAGNOSIS — I25110 Atherosclerotic heart disease of native coronary artery with unstable angina pectoris: Secondary | ICD-10-CM | POA: Diagnosis not present

## 2018-04-18 DIAGNOSIS — E1022 Type 1 diabetes mellitus with diabetic chronic kidney disease: Secondary | ICD-10-CM | POA: Diagnosis not present

## 2018-04-18 DIAGNOSIS — Z7902 Long term (current) use of antithrombotics/antiplatelets: Secondary | ICD-10-CM | POA: Insufficient documentation

## 2018-04-18 DIAGNOSIS — G4733 Obstructive sleep apnea (adult) (pediatric): Secondary | ICD-10-CM | POA: Insufficient documentation

## 2018-04-18 DIAGNOSIS — M109 Gout, unspecified: Secondary | ICD-10-CM | POA: Diagnosis not present

## 2018-04-18 DIAGNOSIS — I2510 Atherosclerotic heart disease of native coronary artery without angina pectoris: Secondary | ICD-10-CM

## 2018-04-18 DIAGNOSIS — Z79899 Other long term (current) drug therapy: Secondary | ICD-10-CM | POA: Diagnosis not present

## 2018-04-18 DIAGNOSIS — Z794 Long term (current) use of insulin: Secondary | ICD-10-CM | POA: Diagnosis not present

## 2018-04-18 DIAGNOSIS — Z8673 Personal history of transient ischemic attack (TIA), and cerebral infarction without residual deficits: Secondary | ICD-10-CM | POA: Diagnosis not present

## 2018-04-18 DIAGNOSIS — I130 Hypertensive heart and chronic kidney disease with heart failure and stage 1 through stage 4 chronic kidney disease, or unspecified chronic kidney disease: Secondary | ICD-10-CM | POA: Insufficient documentation

## 2018-04-18 DIAGNOSIS — I480 Paroxysmal atrial fibrillation: Secondary | ICD-10-CM | POA: Insufficient documentation

## 2018-04-18 DIAGNOSIS — Z950 Presence of cardiac pacemaker: Secondary | ICD-10-CM | POA: Insufficient documentation

## 2018-04-18 DIAGNOSIS — I495 Sick sinus syndrome: Secondary | ICD-10-CM | POA: Diagnosis not present

## 2018-04-18 DIAGNOSIS — Z7982 Long term (current) use of aspirin: Secondary | ICD-10-CM | POA: Insufficient documentation

## 2018-04-18 DIAGNOSIS — Z955 Presence of coronary angioplasty implant and graft: Secondary | ICD-10-CM | POA: Diagnosis not present

## 2018-04-18 DIAGNOSIS — E785 Hyperlipidemia, unspecified: Secondary | ICD-10-CM | POA: Diagnosis not present

## 2018-04-18 DIAGNOSIS — Z9641 Presence of insulin pump (external) (internal): Secondary | ICD-10-CM | POA: Diagnosis not present

## 2018-04-18 DIAGNOSIS — Z953 Presence of xenogenic heart valve: Secondary | ICD-10-CM | POA: Diagnosis not present

## 2018-04-18 DIAGNOSIS — I4892 Unspecified atrial flutter: Secondary | ICD-10-CM

## 2018-04-18 DIAGNOSIS — Q231 Congenital insufficiency of aortic valve: Secondary | ICD-10-CM | POA: Insufficient documentation

## 2018-04-18 DIAGNOSIS — Z951 Presence of aortocoronary bypass graft: Secondary | ICD-10-CM | POA: Insufficient documentation

## 2018-04-18 LAB — LIPID PANEL
Cholesterol: 120 mg/dL (ref 0–200)
HDL: 38 mg/dL — ABNORMAL LOW (ref >40)
LDL CALC: 46 mg/dL (ref 0–99)
Total CHOL/HDL Ratio: 3.2 RATIO
Triglycerides: 182 mg/dL — ABNORMAL HIGH (ref <150)
VLDL: 36 mg/dL (ref 0–40)

## 2018-04-18 MED ORDER — POTASSIUM CHLORIDE ER 10 MEQ PO CPCR: 40.0000 meq | ORAL_CAPSULE | Freq: Every day | ORAL | 3 refills | Status: DC

## 2018-04-18 MED ORDER — SPIRONOLACTONE 25 MG PO TABS: 25.0000 mg | ORAL_TABLET | Freq: Every day | ORAL | 6 refills | Status: DC

## 2018-04-18 MED ORDER — AMLODIPINE BESYLATE 2.5 MG PO TABS: 2.5000 mg | ORAL_TABLET | Freq: Every day | ORAL | 6 refills | Status: DC

## 2018-04-18 NOTE — Patient Instructions (Addendum)
Increase Spironolactone to 25 mg daily  Decrease Potassium to 40 meq daily  Labs today  Labs in 1 week  Your physician recommends that you schedule a follow-up appointment in: 3 months

## 2018-04-19 LAB — VITAMIN D 25 HYDROXY (VIT D DEFICIENCY, FRACTURES): VIT D 25 HYDROXY: 85.6 ng/mL (ref 30.0–100.0)

## 2018-04-19 NOTE — Progress Notes (Signed)
Patient ID: [name] [name], female   DOB: February 16, 1951, 67 y.o.   MRN: [id number] PCP: [name]  Cardiology: Dr. Aundra Dubin  67 y.o. with history of CAD s/p CABG in 12/05 and redo in 1/11, bioprosthetic AVR, diastolic CHF, and sick sinus syndrome s/p PCM presents for cardiology followup.  She had initial CABG with SVG-RCA in 12/05 followed by redo SVG-RCA and bioprosthetic AVR in 1/11.  She has had diastolic CHF and is on Lasix.  In 9/14, she had a CPX test.  This showed severe functional limitation with ischemic ECG changes (inferolateral ST depression and ST elevation in V1 and V2). She had chest pain.  She had R/LHC in 9/14.  This showed elevated left and right heart filling pressures, patent SVG-PDA, and 80% distal LM/80% ostial LAD stenosis.  She was not a candidate for redo CABG (had 2 prior sternotomies as well as chest radiation for Hodgkins lymphoma).  She had DES from left main into proximal LAD and will need long-term DAPT => Brilinta was used as she has been a poor responder to Plavix in the past.  She re-developed angina in 11/15 and was admitted with unstable angina.  LHC showed 80% ostial LM and patent SVG-RCA. She had DES to ostial LM. Echo in 1/18 showed EF 60-65%, normal bioprosthetic aortic valve, mild-moderate MR, PASP 35 mmHg.   She has chronic iron deficiency anemia thought to be due to a slow GI bleed.  Last EGD was unremarkable and a capsule endoscopy was also unremarkable.  She has had periodic transfusions.    She had bilateral mastectomy for DCIS in 58/59 without complication.  She has an insulin pump followed by an endocrinologist at [facility].   Given myalgias with statins, she was started on Praluent.  LDL is now excellent.    In the past she has had short runs of atrial fibrillation.  She is not anticoagulated. Planned for Watchman placement, but given her nickel allergy, we were unable to place a Watchman.     Diagnosed with OSA, now on CPAP.   Given ongoing problems with  exertional dyspnea and occasional chest pain, Cardiolite was repeated in 9/18 and showed inferior changes.  She underwent left/right heart cath in 9/18.  This showed elevated filling pressures, R>L.  There was 75+% ostial LCx stenosis.  However, there were 2 layers of stent from the mid left main into the ostial LAD, and LCx intervention was thought to be very risky.  It was decided to manage her medically.  Echo was repeated in 10/18 showing EF 55-60%, moderate to severe TR.    Given recurrent chest pain, she had ETT-Cardiolite done in 3/19.  This was a normal study with no evidence for ischemia or infarction.   She returns today for follow up of CHF and CAD. Weight is stable.  She recently had rectal bleeding.  She was seen by GI with the thought that the bleeding was diverticular.  This has now resolved.  She is doing ok walking on flat ground, no dyspnea.  She is short of breath if she walks up her stairs at home more than 2-3 times doing chores.  She walks for exercise 2-3 times/week.  Rare palpitations.  She does note shortness of breath when singing.  Rare chest pain, no trigger/nonexertional, brief in duration.     ECG (personally reviewed): NSR, 1st degree AVB, LVH  Labs (7/14): hemoglobin 11 Labs (9/14): K 3.9, creatinine 1.48, hemoglobin 7.3 Labs (10/14): hemoglobin 7.7 Labs (11/14): K  3.8, creatinine 1.5 Labs (12/14): HCT 37.7 Labs (1/15): LDL 106, HDL 41, K 3.9, creatinine 1.6, LFTs normal Labs (2/15): K 3.7, creatinine 1.1, BNP 71 Labs (3/15): HCT 36.7 Labs (4/15): K 3.7, creatinine 1.1, HCT 35.5 Labs (7/15): HCT 36.4 Labs (11/15): HCT 32.1, K 4.2, creatinine 1.06 Labs (12/15): hgb 12 Labs (1/16): HCT 37.1 Labs (3/16): LDL 25, HDL 42, elevated transaminases Labs (6/16): HCT 39.6, plts 146 Labs (7/16): LDL 59, HDL 41, AST 34, ALT 49, AP 125 Labs (11/16): HCT 38.8, alkaline phosphatase 156, AST/ALT normal, K 4, creatinine 1.0 Labs (3/17): K 4.4, creatinine 1.4, LFTs normal, hgb  12.5 Labs (5/17): HCT 41.8, K 4, creatinine 1.3 Labs (9/17): K 4, creatinine 0.43, hgb 13.6 Labs (10/17): LDL 42, HDL 43 Labs (1/18): K 4.3, creatinine 1.1 => 1.19, AST 66, ALT 67, hgb 14.2, BNP 72 Labs (2/18): K 3.9, creatinine 1.2, BNP 49.6, HCT 42.1 Labs (4/18): hgb 13.8 Labs (5/18): HCT 41.7, plts 98, K 4.3, creatinine 1.11, BNP 180 Labs (7/18): hgb 12, plts 110, K 4, creatinine 1.23, AST 38, ALT 44 Labs (8/18): K 5, creatinine 1.17 => 2.1, BUN 33 => 92, BNP 134 Labs (9/18): K 3.6, creatinine 1.42 => 1.44. Hgb 14 => 13.5 Labs (10/18): K 3.5, creatinine 1.21, hgb 11.9, LDL 16, HDL 40 Labs (12/18): K 3.7, creatinine 1.4 Labs (2/19): hgb 13.2 Labs @ [facility] (3/19): K 4.7, creatinine 1.4, ANA negative, RF negative Labs (4/19): K 3.9, creatinine 1.52 => 1.63 Labs (6/19): LDL 8, HDL 42, TGs 259 Labs (9/19): K 4.9, creatinine 1.65, hgb 13.4, plts 129  PMH: 1. Sick sinus syndrome s/p [employer] PCM.  2. HTN 3. H/o SBO 4. Renal cell carcinoma s/p right nephrectomy in 2008.  5. 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  Fibroscan in 11/14 showed advanced fibrosis concerning for cirrhosis. EGD in 11/14 showed no varices.  6. Diastolic CHF: Echo (0/25) with EF 60-65%, bioprosthetic aortic valve with mean gradient 15 mmHg, mild MR.  CPX (9/14): peak VO2 10.4, VE/VCO2 43.8, inferolateral ST depression and ST elevation in V1/V2 with severe dyspnea and chest pain, severe functional limitation with ischemic changes.  Echo (9/14) with EF 60-65%, normal RV size and systolic function, mild-moderate MR, bioprosthetic aortic valve normal, PA systolic pressure 51 mmHg, no evidence for pericardial constriction though exam incomplete for constriction.  Echo (11/15) with EF 55-60%, bioprosthetic aortic valve, mild-moderate MR, moderate TR, PA systolic pressure 42 mmHg.  - TEE (5/17) with EF 60-65%, normal bioprosthetic aortic valve with mean gradient 13 mmHg, normal RV size and systolic function. - Echo (1/18) with EF  60-65%, normal bioprosthetic aortic valve, mild-moderate MR, PASP 35 mmHg.  - Echo (10/18) with EF 55-60%, bioprosthetic aortic valve mean gradient 10 mmHg, MAC with mild mitral stenosis (mean gradient 7) and mild mitral regurgitation, moderate to severe TR, PASP 43 mmHg.  - RHC (9/18): mean RA 13, PA 44/18 mean 33, mean PCWP 19, CI 3.45, PVR 2.7 WU.  7. TAH-BSO 8. Type I diabetes: Has insulin pump.  9. H/o ischemic colitis. 10. H/o PUD. 11. Diabetic gastroparesis. 12. Hyperlipidemia: Myalgias with Crestor and atorvastatin, elevated LFTs with simvastatin.  Myalgias with > 10 mg pravastatin. Now on Praluent.  13. CKD 14. Bicuspid aortic valve s/p 19 mm Edwards pericardial valve in 1/11 (had post-operative Dresslers syndrome).   15. CAD: CABG with SVG-RCA in 12/05.  Redo SVG-RCA in 1/11 with AVR.  LHC/RHC (9/14) with mean RA 18, PA 62/25 mean 43, mean PCWP 26,  CI 2.8, 70-80% distal LM stenosis, 80% ostial LAD stenosis, total occlusion RCA, SVG-PDA patent.  Patient had DES LM into LAD.  Plan to continue long-term ASA/Brilinta (poor responder to Plavix).  ETT-cardiolite (4/15) with 6' exercise, EF 60%, ST depression in recovery and mild chest pain, no ischemia or infarction by perfusion images.  Unstable angina 11/15 with 80% LM, patent SVG-RCA => DES to ostial LM.   - Lexiscan Cardiolite (2/18): EF 62%, no ischemia or infarction (normal).  She had a significant reaction to [place] and probably should not get again.  - Cardiolite (9/18): EF 72%, prior inferior infarct with mild peri-infarct ischemia.  - LHC (9/18): 2 layers stent left main - ostial LAD with 50% dLM in-stent restenosis, 75+% ostial LCx, totally occluded RCA, SVG-RCA patent.  Medical management planned.  - ETT-Cardiolite (4/19): EF normal, 7'11" exercise, no ischemia or infarction.  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  17. Anemia: Iron-deficiency.  Suspect chronic GI blood loss.  EGD in 9/14  was unremarkable.  Capsule endoscopy 10/14 was unremarkable.  Has required periodic blood transfusions.  18. Atrial fibrillation: Paroxysmal, noted by [facility] interrogation.  19. Carotid stenosis: Carotid dopplers (2/15) with 40-59% bilateral stenosis. Carotid dopplers (3/16) with 40-59% bilateral ICA stenosis. Carotid dopplers (3/17) with 40-59% bilateral ICA stenosis.  - Carotid dopplers (3/18) with mild BICA stenosis.  20. 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  22. C difficile colitis 2017 23. Gout 24. ?TIA: head CT negative.  25. OSA: Moderate, uses CPAP.  26. Chronic thrombocytopenia  SH: Married, lives in [place], nonsmoker. Former school principal.  FH: CAD  ROS: All systems reviewed and negative except as per HPI.   Current Outpatient Medications  Medication Sig Dispense Refill  . Alirocumab (PRALUENT) 150 MG/ML SOPN Inject 1 pen into the skin every 14 (fourteen) days. 2 pen 11  . ALPRAZolam (XANAX) 0.5 MG tablet Take 1 tablet (0.5 mg total) by mouth 2 (two) times daily as needed for anxiety. 60 tablet 1  . amLODipine (NORVASC) 2.5 MG tablet Take 1 tablet (2.5 mg total) by mouth daily. 30 tablet 6  . APIDRA 100 UNIT/ML injection Inject 46 Units into the skin as directed. INFUSE THROUGH INSULIN PUMP UTD::[place] 46.15 units daily, Bolus depends on carb intake  2  . aspirin EC 81 MG tablet Take 81 mg by mouth at bedtime.    . Calcium Carbonate-Vitamin D (RA CALCIUM PLUS VITAMIN D) 600-400 MG-UNIT per tablet Take 1 tablet by mouth daily.     . Cholecalciferol 2000 UNITS TABS Take 1 tablet by mouth every morning.     . clobetasol cream (TEMOVATE) 4.92 % Apply 1 application topically 2 (two) times daily.    . Coenzyme Q10 (CO Q 10) 100 MG CAPS Take 1 capsule by mouth at bedtime.     . colchicine 0.6 MG tablet Take 0.6 mg by mouth daily as needed (gout flare).    Marland Kitchen  docusate sodium (COLACE) 100 MG capsule Take 100 mg by mouth at bedtime.    . Fe Fum-FA-B Cmp-C-Zn-Mg-Mn-Cu (HEMATINIC PLUS VIT/MINERALS) 106-1 MG TABS TAKE 1 TABLET BY MOUTH THREE TIMES A WEEK(MONDAY, WEDNESDAY, AND FRIDAY) OR AS DIRECTED WITH A VITAMIN "C" 90 tablet 0  . fluticasone (FLONASE) 50 MCG/ACT nasal spray Place 2 sprays into both nostrils daily. 48 g 3  . glucagon (GLUCAGON EMERGENCY) 1 MG injection Inject  1 mg into the vein once as needed (for severe reaction). Reported on 12/05/2015    . Icosapent Ethyl (VASCEPA) 1 g CAPS Take 2 capsules (2 g total) by mouth 2 (two) times daily. 360 capsule 3  . isosorbide mononitrate (IMDUR) 120 MG 24 hr tablet Take 1 tablet (120 mg total) by mouth daily. 90 tablet 3  . latanoprost (XALATAN) 0.005 % ophthalmic solution Place 1 drop into both eyes at bedtime.    . Magnesium Oxide 250 MG TABS Take 250 mg by mouth at bedtime.     . metolazone (ZAROXOLYN) 2.5 MG tablet Take 2.5 mg (1 tab) once as directed by CHF clinic. Call before taking [phone] 3 tablet 0  . metoprolol tartrate (LOPRESSOR) 50 MG tablet TAKE 1 AND 1/2 TABLETS(75 MG) BY MOUTH TWICE DAILY 270 tablet 2  . Multiple Vitamins-Minerals (MULTIVITAMIN WITH MINERALS) tablet Take 1 tablet by mouth every morning.     . mupirocin cream (BACTROBAN) 2 % Apply 1 application topically 2 (two) times daily as needed. 90 g 0  . nitroGLYCERIN (NITROLINGUAL) 0.4 MG/SPRAY spray PLACE 1 SPRAY UNDER THE TONGUE EVERY 5 MINUTES FOR 3 DOSES AS NEEDED FOR CHEST PAIN 14.7 g 0  . pantoprazole (PROTONIX) 40 MG tablet TAKE 1 TABLET(40 MG) BY MOUTH DAILY 90 tablet 3  . Polyethyl Glycol-Propyl Glycol (SYSTANE) 0.4-0.3 % SOLN Apply 1 drop to eye daily as needed (dry eyes).    . potassium chloride (MICRO-K) 10 MEQ CR capsule Take 4 capsules (40 mEq total) by mouth daily. 720 capsule 3  . pravastatin (PRAVACHOL) 10 MG tablet Take 1 tablet (10 mg total) by mouth daily. 90 tablet 3  . spironolactone (ALDACTONE) 25 MG  tablet Take 1 tablet (25 mg total) by mouth daily. 30 tablet 6  . ticagrelor (BRILINTA) 60 MG TABS tablet TAKE 1 TABLET(60 MG) BY MOUTH TWICE DAILY 180 tablet 2  . torsemide (DEMADEX) 20 MG tablet Take 73m in the AM and 451mat 3 pm. May take an extra as needed in the PM for wt gain/swelling. 630 tablet 3  . triamcinolone cream (KENALOG) 0.5 % Apply 1 application topically daily as needed (inflamation).     . Marland KitchenLORIC 40 MG tablet Take 1 tablet (40 mg total) by mouth at bedtime. 90 tablet 3  . UNABLE TO FIND Med Name: So Clean CPAP cleaning system 1 each 0  . vitamin C (ASCORBIC ACID) 500 MG tablet Take 500 mg by mouth 3 (three) times a week.     No current facility-administered medications for this encounter.    Filed Weights   04/18/18 0944  Weight: 53.8 kg (118 lb 9.6 oz)    BP 120/62   Pulse 75   Wt 53.8 kg (118 lb 9.6 oz)   SpO2 97%   BMI 21.01 kg/m  General: NAD Neck: No JVD, no thyromegaly or thyroid nodule.  Lungs: Clear to auscultation bilaterally with normal respiratory effort. CV: Nondisplaced PMI.  Heart regular S1/S2, no S3/S4, 2/6 early SEM RUSB.  No peripheral edema.  No carotid bruit.  Normal pedal pulses.  Abdomen: Soft, nontender, no hepatosplenomegaly, no distention.  Skin: Intact without lesions or rashes.  Neurologic: Alert and oriented x 3.  Psych: Normal affect. Extremities: No clubbing or cyanosis.  HEENT: Normal.   Assessment/Plan:  1. CAD: s/p SVG-RCA in 12/05, then redo SVG-RCA in 1/11--> distal left main/proximal LAD severe stenosis in 9/14.  PCI with DES to distal LM/proximal LAD was done rather than bypass as she  would have been a poor candidate for redo sternotomy (2 prior sternotomies and history of chest wall radiation for Hodgkins disease).  Recurrent unstable angina with 80% ostial LM treated with repeat DES in 11/15.  Lexiscan Cardiolite in 9/18 inferior changes.  Repeat LHC (9/18) showed 50% distal left main in-stent restenosis and 75+% ostial LCx  stenosis. I reviewed the films at the time with interventional cardiology => would be very difficult to intervene on the ostial LCx through 2 overlapping stents from left main into the LAD.  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  Given no ACS, we planned medical management and increased imdur.  Cardiolite in 3/19 showed no ischemia or infarction.  She is now having minimal chest pain, and what she has is atypical.  - Continue Brilinta 60 mg bid (poor responder to plavix) long-term given recurrent left main disease. She has recent recurrent rectal bleeding thought to be diverticular.  This has resolved.  - Continue ASA 81, statin, metoprolol, and Imdur 120 mg daily.    - She was unable to tolerate ranolazine.  2. Bioprosthetic aortic valve replacement: In setting of bicuspid aortic valve, thoracic aorta not dilated on 2017 TEE.  Valve looked ok on 10/18 echo.   3. Chronic diastolic CHF: Most recent echo in 10/18 showed normal LV systolic function but also showed moderate to severe TR.  RHC was concerning for right > left heart failure. She is doing better now.  She is not volume overloaded on exam, NYHA class II symptoms. She is not taking metolazone.  - Continue torsemide 80 qam/40 qpm.  BMET today.  - Increase spironolactone to 25 mg daily and decrease KCl to 40 daily.  4. CKD:  Patient has a single kidney s/p right nephrectomy.  CKD stage 3.  BMET today.  5. Hyperlipidemia: She has not been able to tolerate any higher potency statin than pravastatin 10 mg daily.   - Continue pravastatin and Praluent.  - She is on Vascepa for elevated TGs.  Repeat lipids today to make sure TGs coming down.  6. Anemia: Chronic/slow GI bleed.  Unremarkable EGD and capsule endoscopy in fall 2014.  Unfortunately, needs to stay on Brilinta long-term (Plavix non-responder).  Recent rectal bleeding thought to be diverticular, has now resolved.  Has followup with GI.  7. Atrial fibrillation:  Paroxysmal, brief episodes noted on PCM interrogation. Given GI bleeding and need to be on Brilinta, risks have been thought to outweigh benefits of anticoagulation.  She is not a good Multaq candidate with CHF.  If she needs an antiarrhythmic, consider Tikosyn or Sotalol.  NSR today.  - We were unable to place Watchman device due to nickel allergy.   8. Carotid bruit: Only mild stenosis on last dopplers in 3/18.  9. SSS: Pacemaker in place.  10. HTN: controlled. Continue current regimen.   11. OSA: Moderate, using CPAP.  12. ?TIA: Transient dysarthria in 3/18, could have been afib-related TIA.  No recurrent symptoms.  13. Thrombocytopenia: Chronic and mild. Follows with hematology. .   Followup 3 months.   [name] 04/19/2018

## 2018-04-21 ENCOUNTER — Other Ambulatory Visit (HOSPITAL_COMMUNITY): Payer: Self-pay | Admitting: *Deleted

## 2018-04-22 ENCOUNTER — Other Ambulatory Visit (HOSPITAL_COMMUNITY): Payer: Self-pay

## 2018-04-22 MED ORDER — AMLODIPINE BESYLATE 2.5 MG PO TABS: 2.5000 mg | ORAL_TABLET | Freq: Every day | ORAL | 1 refills | Status: DC

## 2018-04-22 MED ORDER — SPIRONOLACTONE 25 MG PO TABS: 25.0000 mg | ORAL_TABLET | Freq: Every day | ORAL | 1 refills | Status: DC

## 2018-04-28 ENCOUNTER — Encounter: Payer: Self-pay | Admitting: Cardiology

## 2018-04-28 ENCOUNTER — Ambulatory Visit (HOSPITAL_COMMUNITY)
Admission: RE | Admit: 2018-04-28 | Discharge: 2018-04-28 | Disposition: A | Discharge status: Home / Self Care | Payer: Medicare Other | Source: Ambulatory Visit | Attending: Internal Medicine | Admitting: Internal Medicine

## 2018-04-28 ENCOUNTER — Other Ambulatory Visit: Payer: Medicare Other

## 2018-04-28 ENCOUNTER — Ambulatory Visit: Payer: Medicare Other | Admitting: Cardiology

## 2018-04-28 VITALS — BP 126/58 | HR 70 | Ht 63.0 in | Wt 118.0 lb

## 2018-04-28 DIAGNOSIS — I5032 Chronic diastolic (congestive) heart failure: Secondary | ICD-10-CM | POA: Insufficient documentation

## 2018-04-28 DIAGNOSIS — Z9989 Dependence on other enabling machines and devices: Secondary | ICD-10-CM | POA: Diagnosis not present

## 2018-04-28 DIAGNOSIS — I480 Paroxysmal atrial fibrillation: Secondary | ICD-10-CM | POA: Insufficient documentation

## 2018-04-28 DIAGNOSIS — I10 Essential (primary) hypertension: Secondary | ICD-10-CM | POA: Diagnosis not present

## 2018-04-28 DIAGNOSIS — G4733 Obstructive sleep apnea (adult) (pediatric): Secondary | ICD-10-CM

## 2018-04-28 LAB — BASIC METABOLIC PANEL
Anion gap: 10 (ref 5–15)
BUN: 62 mg/dL — ABNORMAL HIGH (ref 8–23)
CALCIUM: 10.4 mg/dL — AB (ref 8.9–10.3)
CHLORIDE: 97 mmol/L — AB (ref 98–111)
CO2: 27 mmol/L (ref 22–32)
CREATININE: 2.08 mg/dL — AB (ref 0.44–1.00)
GFR calc non Af Amer: 24 mL/min — ABNORMAL LOW (ref >60)
GFR, EST AFRICAN AMERICAN: 28 mL/min — AB (ref >60)
Glucose, Bld: 139 mg/dL — ABNORMAL HIGH (ref 70–99)
Potassium: 4.7 mmol/L (ref 3.5–5.1)
SODIUM: 134 mmol/L — AB (ref 135–145)

## 2018-04-28 NOTE — Patient Instructions (Signed)
Medication Instructions:  Your physician recommends that you continue on your current medications as directed. Please refer to the Current Medication list given to you today.  If you need a refill on your cardiac medications before your next appointment, please call your pharmacy.   Lab work:  If you have labs (blood work) drawn today and your tests are completely normal, you will receive your results only by: Marland Kitchen MyChart Message (if you have MyChart) OR . A paper copy in the mail If you have any lab test that is abnormal or we need to change your treatment, we will call you to review the results.  Follow-Up: At [facility], you and your health needs are our priority.  As part of our continuing mission to provide you with exceptional heart care, we have created designated Provider Care Teams.  These Care Teams include your primary Cardiologist (physician) and Advanced Practice Providers (APPs -  Physician Assistants and Nurse Practitioners) who all work together to provide you with the care you need, when you need it. You will need a follow up appointment in 1 years.  Please call our office 2 months in advance to schedule this appointment.  You may see Dr. Radford Pax or one of the following Advanced Practice Providers on your designated Care Team:   [place], PA-C Melina Copa, PA-C . Ermalinda Barrios, PA-C

## 2018-04-28 NOTE — Progress Notes (Signed)
Cardiology Office Note:    Date:  04/28/2018   ID:  [name] [name], DOB [birth year], MRN [id number]  PCP:  [name], MD  Cardiologist:  No primary care provider on file.    Referring MD: [name], MD   Chief Complaint  Patient presents with  . Sleep Apnea  . Hypertension    History of Present Illness:    [name] [name] is a 67 y.o. female with a hx of chronic diastolic CHF, CAD and afib who was referred for a sleep study by Dr. Aundra Dubin due to excessive daytime sleepiness.  She was found to have moderate OSA with an AHI of 22/hr with significant oxygen desaturations as low as 85%.  She underwent CPAP titration to 18cm H2O but did not tolerate the high pressure and was subsequently placed on auto Pap from 6 to 14 cm H2O.. She is doing well with her CPAP device has had a lot of problems with the mask is not fitting correctly.  She says she has very thin skin over the bridge of her nose and went with a DreamWear nasal mask.  Unfortunately that mask moves a lot on the back of her head and is not sure that is leaking some.  She does have another type of nasal mask as well that fits a little bit better.  She thinks that she might need a smaller size.  She is been having tremendous problems with dry mouth which she attributes some to the diuretics but she does not use a chinstrap.  She feels the pressure is adequate.    Past Medical History:  Diagnosis Date  . Anxiety   . Aortic stenosis    a. bicuspid aortic valve; mean gradient of 20 mmHg in 2/10; b. s/p bioprosth AVR (19-mm Edwards pericardial valve) with a redo coronary artery bypass graft procedure in 07/2009; postoperative Dressler's syndrome    . Arthritis   . Breast cancer ([place])   . Carcinoma, renal cell ([place]) 05/2007   Laparoscopic right nephrectomy  . 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 Chronic diastolic CHF (congestive heart failure) ([place])    a. 9.2014 EchoP EF 60-65%, no rwma, bioprosth AVR, mean gradient of  72mHg, mild to mod MR, PASP 575mg.  . CKD (chronic kidney disease), stage II   . Colitis, ischemic ([facility]2008  . Coronary artery disease    a. initial CABG with SVG-RCA in 12/05. b. redo SVG-RCA and bioprosthetic AVR in 1/11. d. 9/14 - PCI with DES to distal LM/proximal LAD was done rather than bypass as she would have been a poor candidate for redo sternotomy. d. S/p DES to prox LM 05/2014.  . Diabetes mellitus 03/2010    TYPE II: Hemoglobin A1c of 7.4 in  03/2010; 8.4 in 06/2010; treated with insulin pump  . Duodenal ulcer    Remote; H. pylori positive  . Gastroparesis due to DM ([facility]10/04/2012  . Genetic testing 01/14/2017   [name] underwent genetic counseling and testing for hereditary cancer syndromes on 01/01/2017. Her results were negative for mutations in all 83 genes analyzed by [facility]'s 83-gene Common Hereditary Cancers Panel. Genes analyzed include: ALK, APC, ATM, AXIN2, BAP1, BARD1, BLM, BMPR1A, BRCA1, BRCA2, BRIP1, CASR, CDC73, CDH1, CDK4, CDKN1B, CDKN1C, CDKN2A, CEBPA, CHEK2, CTNNA1, DICER1, DIS3L2,   . Hodgkin's disease ([facility]1991   Mantle radiation therapy  . Hodgkin's disease ([facility]  . Hyperlipidemia   . Hyperplastic gastric polyp 06/23/2012  . Hypertension   . Iron  deficiency anemia    a. 03/2013 EGD: essentially normal. Possible slow GIB.  . Migraines   . NASH (nonalcoholic steatohepatitis) 1999   -biopsy in 1999  . OSA on CPAP 04/24/2017   Moderate with AHI 22/hr now on CPAP at 18cm H2o  . Osteopenia     hip on DEXA in October 2007.  . Osteoporosis   . Oxygen deficiency   . Pacemaker    six sinus syndrome  . PAF (paroxysmal atrial fibrillation) ([place])    a.  h/o brief episodes noted on ppm interrogation. b. given GI bleeding and need to be on both ASA 81 and Brilinta, risks likely outweigh benefits of anticoagulation.   . Small bowel obstruction (H[facility])    Recurrent; resolved after resection of a lipoma  . Syncope    a. H/o recurrent syncope with pauses on  loop recorder s/p Medtronic pacemaker 07/2012.    Past Surgical History:  Procedure Laterality Date  . ABDOMINAL HYSTERECTOMY    . AORTIC VALVE REPLACEMENT  2011   Aortic valve replacement surgery with a 19 mm bioprosthetic device post redo CABG January 2011.  Marland Kitchen BONE MARROW BIOPSY    . BREAST EXCISIONAL BIOPSY     benign, bilateral  . CARDIAC CATHETERIZATION    . CERVICAL BIOPSY    . COLONOSCOPY     Multiple with adenomatous polyps  . COLONOSCOPY  06/23/2012   Procedure: COLONOSCOPY;  Surgeon: Gatha Mayer, MD;  Location: [facility] END[facility]COPY;  Service: Endoscopy;  Laterality: N/A;  . CORONARY ARTERY BYPASS GRAFT  2005, 2011   CABG-most recent SVG to RCA-12/05; RCA occluded with patent graft in 2006; Redo bypass in 07/2009  . ESOPHAGOGASTRODUODENOSCOPY    . ESOPHAGOGASTRODUODENOSCOPY  06/23/2012   Procedure: ESOPHAGOGASTRODUODENOSCOPY (EGD);  Surgeon: Gatha Mayer, MD;  Location: [facility];  Service: Endoscopy;  Laterality: N/A;  . ESOPHAGOGASTRODUODENOSCOPY N/A 04/06/2013   Procedure: ESOPHAGOGASTRODUODENOSCOPY (EGD);  Surgeon: Irene Shipper, MD;  Location[facility] ENDOSCOPY;  Service: Endoscopy;  Laterality: N/A;  . EXPLORATORY LAPAROTOMY W/ BOWEL RESECTION     Small bowel resection of lipoma  . GIVENS CAPSULE STUDY N/A 04/30/2013   Procedure: GIVENS CAPSULE STUDY;  Surgeon: Gatha Mayer, MD;  Location: [place];  Service: Endoscopy;  Laterality: N/A;  . LEFT AND RIGHT HEART CATHETERIZATION WITH CORONARY ANGIOGRAM N/A 04/07/2013   Procedure: LEFT AND RIGHT HEART CATHETERIZATION WITH CORONARY ANGIOGRAM;  Surgeon: Larey Dresser, MD;  Location: [facility] CATH LAB;  Service: Cardiovascular;  Laterality: N/A;  . LEFT ATRIAL APPENDAGE OCCLUSION N/A 02/16/2016   Watchman not placed - nickel allergy  . LEFT HEART CATHETERIZATION WITH CORONARY/GRAFT ANGIOGRAM N/A 06/07/2014   Procedure: LEFT HEART CATHETERIZATION WITH Beatrix Fetters;  Surgeon: Burnell Blanks, MD;  Location: [facility] C[facility] LAB;   Service: Cardiovascular;  Laterality: N/A;  . LIVER BIOPSY    . LOOP RECORDER IMPLANT N/A 12/10/2011   Procedure: LOOP RECORDER IMPLANT;  Surgeon: Evans Lance, MD;  Location: [facility] CATH LAB;  Service: Cardiovascular;  Laterality: N/A;  . MALONEY DILATION  06/23/2012   Procedure: [name] DILATION;  Surgeon: Gatha Mayer, MD;  Location: [facility] ENDOSCOPY;  Service: Endoscopy;;  54 fr  . MASTECTOMY Left 05/17/2015   total   . NEPHRECTOMY  2008   Laparoscopic right nephrectomy, renal cell carcinoma  . PACEMAKER INSERTION  08/21/2012   Medtronic Adapta L dual-chamber pacemaker  . PERCUTANEOUS CORONARY STENT INTERVENTION (PCI-S) N/A 04/09/2013   Procedure: PERCUTANEOUS CORONARY STENT INTERVENTION (PCI-S);  Surgeon: Annita Brod  Angelena Form, MD;  Location: [facility] CATH LAB;  Service: Cardiovascular;  Laterality: N/A;  . PERMANENT PACEMAKER INSERTION N/A 08/21/2012   Procedure: PERMANENT PACEMAKER INSERTION;  Surgeon: Evans Lance, MD;  Location: [facility] CATH LAB;  Service: Cardiovascular;  Laterality: N/A;  . RIGHT HEART CATH AND CORONARY/GRAFT ANGIOGRAPHY N/A 04/19/2017   Procedure: RIGHT HEART CATH AND CORONARY/GRAFT ANGIOGRAPHY;  Surgeon: Larey Dresser, MD;  Location: [place] CV LAB;  Service: Cardiovascular;  Laterality: N/A;  . TEE WITHOUT CARDIOVERSION N/A 12/07/2015   Procedure: TRANSESOPHAGEAL ECHOCARDIOGRAM (TEE);  Surgeon: Larey Dresser, MD;  Location: [place];  Service: Cardiovascular;  Laterality: N/A;  . TOTAL ABDOMINAL HYSTERECTOMY W/ BILATERAL SALPINGOOPHORECTOMY  2000   endometriosis and ovarian cysts  . TOTAL MASTECTOMY Left 05/17/2015   Procedure: LEFT TOTAL MASTECTOMY;  Surgeon: Rolm Bookbinder, MD;  Location: [place];  Service: General;  Laterality: Left;  . TUMOR EXCISION  1981   removal of Hodgkins lymphoma    Current Medications: Current Meds  Medication Sig  . Alirocumab (PRALUENT) 150 MG/ML SOPN Inject 1 pen into the skin every 14 (fourteen) days.  . ALPRAZolam (XANAX)  0.5 MG tablet Take 1 tablet (0.5 mg total) by mouth 2 (two) times daily as needed for anxiety.  Marland Kitchen amLODipine (NORVASC) 2.5 MG tablet Take 1 tablet (2.5 mg total) by mouth daily.  . APIDRA 100 UNIT/ML injection Inject 46 Units into the skin as directed. INFUSE THROUGH INSULIN PUMP UTD::[place] 46.15 units daily, Bolus depends on carb intake  . aspirin EC 81 MG tablet Take 81 mg by mouth at bedtime.  . Calcium Carbonate-Vitamin D (RA CALCIUM PLUS VITAMIN D) 600-400 MG-UNIT per tablet Take 1 tablet by mouth daily.   . Cholecalciferol 2000 UNITS TABS Take 1 tablet by mouth every morning.   . clobetasol cream (TEMOVATE) 3.42 % Apply 1 application topically 2 (two) times daily as needed.   . Coenzyme Q10 (CO Q 10) 100 MG CAPS Take 1 capsule by mouth at bedtime.   . colchicine 0.6 MG tablet Take 0.6 mg by mouth daily as needed (gout flare).  Marland Kitchen docusate sodium (COLACE) 100 MG capsule Take 100 mg by mouth at bedtime.  . Fe Fum-FA-B Cmp-C-Zn-Mg-Mn-Cu (HEMATINIC PLUS VIT/MINERALS) 106-1 MG TABS TAKE 1 TABLET BY MOUTH THREE TIMES A WEEK(MONDAY, WEDNESDAY, AND FRIDAY) OR AS DIRECTED WITH A VITAMIN "C"  . fluticasone (FLONASE) 50 MCG/ACT nasal spray Place 2 sprays into both nostrils daily. (Patient taking differently: Place 2 sprays into both nostrils daily as needed. )  . glucagon (GLUCAGON EMERGENCY) 1 MG injection Inject 1 mg into the vein once as needed (for severe reaction). Reported on 12/05/2015  . Icosapent Ethyl (VASCEPA) 1 g CAPS Take 2 capsules (2 g total) by mouth 2 (two) times daily.  . isosorbide mononitrate (IMDUR) 120 MG 24 hr tablet Take 1 tablet (120 mg total) by mouth daily.  Marland Kitchen latanoprost (XALATAN) 0.005 % ophthalmic solution Place 1 drop into both eyes at bedtime.  . Magnesium Oxide 250 MG TABS Take 250 mg by mouth at bedtime.   . metolazone (ZAROXOLYN) 2.5 MG tablet Take 2.5 mg (1 tab) once as directed by CHF clinic. Call before taking [phone]  . metoprolol tartrate (LOPRESSOR) 50 MG  tablet TAKE 1 AND 1/2 TABLETS(75 MG) BY MOUTH TWICE DAILY  . Multiple Vitamins-Minerals (MULTIVITAMIN WITH MINERALS) tablet Take 1 tablet by mouth every morning.   . mupirocin cream (BACTROBAN) 2 % Apply 1 application topically 2 (two) times daily as needed.  Marland Kitchen  nitroGLYCERIN (NITROLINGUAL) 0.4 MG/SPRAY spray PLACE 1 SPRAY UNDER THE TONGUE EVERY 5 MINUTES FOR 3 DOSES AS NEEDED FOR CHEST PAIN  . pantoprazole (PROTONIX) 40 MG tablet TAKE 1 TABLET(40 MG) BY MOUTH DAILY  . Polyethyl Glycol-Propyl Glycol (SYSTANE) 0.4-0.3 % SOLN Apply 1 drop to eye daily as needed (dry eyes).  . potassium chloride (MICRO-K) 10 MEQ CR capsule Take 4 capsules (40 mEq total) by mouth daily.  . pravastatin (PRAVACHOL) 10 MG tablet Take 1 tablet (10 mg total) by mouth daily.  Marland Kitchen spironolactone (ALDACTONE) 25 MG tablet Take 1 tablet (25 mg total) by mouth daily.  . ticagrelor (BRILINTA) 60 MG TABS tablet TAKE 1 TABLET(60 MG) BY MOUTH TWICE DAILY  . torsemide (DEMADEX) 20 MG tablet Take 96m in the AM and 418mat 3 pm. May take an extra as needed in the PM for wt gain/swelling.  . triamcinolone cream (KENALOG) 0.5 % Apply 1 application topically daily as needed (inflamation).   . Marland KitchenLORIC 40 MG tablet Take 1 tablet (40 mg total) by mouth at bedtime.  . Marland KitchenNABLE TO FIND Med Name: So Clean CPAP cleaning system     Allergies:   Avandia [rosiglitazone maleate]; Cephalexin; Clindamycin hcl; Lincomycin; Metformin; Novolog [insulin aspart]; Penicillins; Rosiglitazone; Tizanidine; Adhesive [tape]; Atorvastatin; Cholestyramine; Gentamycin [gentamicin]; Humalog [insulin lispro]; Iodinated diagnostic agents; Limonene; Pravastatin; Prednisone; Ranexa [ranolazine]; Rosuvastatin; Simvastatin; Strawberry extract; Clopidogrel; Doxycycline; Plavix [clopidogrel bisulfate]; Codeine; Erythromycin; Hydrocodone-acetaminophen; Latex; Neomycin; Nickel; Ranitidine; Tamiflu [oseltamivir]; and Tramadol   Social History   Socioeconomic History  . Marital  status: Married    Spouse name: Not on file  . Number of children: 2  . Years of education: Not on file  . Highest education level: Not on file  Occupational History  . Occupation: retired    Comment: elementary principal  Social Needs  . Financial resource strain: Not on file  . Food insecurity:    Worry: Not on file    Inability: Not on file  . Transportation needs:    Medical: Not on file    Non-medical: Not on file  Tobacco Use  . Smoking status: Never Smoker  . Smokeless tobacco: Never Used  Substance and Sexual Activity  . Alcohol use: No    Alcohol/week: 0.0 standard drinks  . Drug use: No  . Sexual activity: Yes  Lifestyle  . Physical activity:    Days per week: Not on file    Minutes per session: Not on file  . Stress: Not on file  Relationships  . Social connections:    Talks on phone: Not on file    Gets together: Not on file    Attends religious service: Not on file    Active member of club or organization: Not on file    Attends meetings of clubs or organizations: Not on file    Relationship status: Not on file  Other Topics Concern  . 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   Wear seatbelt.    Attends church. [place].      Family History: The patient's family history includes Breast cancer (age of onset: 4155in her sister; Cancer (age of onset: 3022in her sister; Cancer (age of onset: 5073in her maternal grandfather; Diabetes in her  father; Glaucoma in her brother; Heart attack in her father, maternal grandmother, and mother; Heart disease in her father and mother; Hypertension in her brother; Lung cancer in her sister. There is no history of Colon cancer or Stomach cancer.  ROS:   Please see the history of present illness.    ROS  All  other systems reviewed and negative.   EKGs/Labs/Other Studies Reviewed:    The following studies were reviewed today: PAP download  EKG:  EKG is not ordered today.    Recent Labs: 01/03/2018: Magnesium 2.7 04/14/2018: ALT 32; BUN 52; Creatinine, Ser 1.65; Hemoglobin 13.4; Platelets 129; Potassium 4.4; Sodium 134   Recent Lipid Panel    Component Value Date/Time   CHOL 120 04/18/2018 1049   TRIG 182 (H) 04/18/2018 1049   HDL 38 (L) 04/18/2018 1049   CHOLHDL 3.2 04/18/2018 1049   VLDL 36 04/18/2018 1049   LDLCALC 46 04/18/2018 1049   LDLDIRECT 43.0 12/14/2014 0938    Physical Exam:    VS:  BP (!) 126/58   Pulse 70   Ht 5' 3" (1.6 m)   Wt 118 lb (53.5 kg)   SpO2 99%   BMI 20.90 kg/m     Wt Readings from Last 3 Encounters:  04/28/18 118 lb (53.5 kg)  04/18/18 118 lb 9.6 oz (53.8 kg)  03/06/18 120 lb 9.6 oz (54.7 kg)     GEN:  Well nourished, well developed in no acute distress HEENT: Normal NECK: No JVD; No carotid bruits LYMPHATICS: No lymphadenopathy CARDIAC: RRR, no murmurs, rubs, gallops RESPIRATORY:  Clear to auscultation without rales, wheezing or rhonchi  ABDOMEN: Soft, non-tender, non-distended MUSCULOSKELETAL:  No edema; No deformity  SKIN: Warm and dry NEUROLOGIC:  Alert and oriented x 3 PSYCHIATRIC:  Normal affect   ASSESSMENT:    1. OSA on CPAP   2. Essential hypertension    PLAN:    In order of problems listed above:  1.  OSA - the patient is tolerating PAP therapy is having a lot of problems finding a mass that actually fits that she can tolerate.  She is currently using the DreamWear mask due to problems with other masks coming across the bridge of her nose and irritating it.  The PAP download was reviewed today and showed an AHI of 6.2/hr on auto PAP with 87% compliance in using more than 4 hours nightly.  She is concerned because she is gotten several alarms stating that her apneas are as high as 25/h at night.  In looking back at her online  app on her phone most of the apneas are under 5 but she does have a few outliers.  I suspect that she may be breathing through her mouth some given that she is not using a chinstrap and she is having severe dry mouth.  I have recommended that she take her mass to her DME company and have them try to fit her with one that she will tolerate that will not go across the bridge of her nose but also fit adequately without leaking moving around on her head.  I am also can order a chinstrap and I will get a repeat download in 4 weeks.  I have encouraged her to try to avoid sleeping on her back and may be consider looking on [place] for sleep pillows.  The patient has been using and benefiting from PAP use and will continue to benefit from therapy.   2.  HTN - BP is well  controlled on exam today.  She will continue on amlodipine 2.5 mg daily, Lopressor 75 mg twice daily and spironolactone 25 mg daily.   Medication Adjustments/Labs and Tests Ordered: Current medicines are reviewed at length with the patient today.  Concerns regarding medicines are outlined above.  No orders of the defined types were placed in this encounter.  No orders of the defined types were placed in this encounter.   Signed, Fransico Him, MD  04/28/2018 3:42 PM    [place]

## 2018-05-01 ENCOUNTER — Telehealth (HOSPITAL_COMMUNITY): Payer: Self-pay

## 2018-05-01 ENCOUNTER — Other Ambulatory Visit (HOSPITAL_COMMUNITY): Payer: Self-pay

## 2018-05-01 MED ORDER — POTASSIUM CHLORIDE ER 10 MEQ PO CPCR: 20.0000 meq | ORAL_CAPSULE | Freq: Every day | ORAL | 3 refills | Status: DC

## 2018-05-01 MED ORDER — TORSEMIDE 20 MG PO TABS: 60.0000 mg | ORAL_TABLET | Freq: Every day | ORAL | 1 refills | Status: DC

## 2018-05-01 NOTE — Telephone Encounter (Signed)
Pt called and is agreeable to medication change, pt has labs work scheduled

## 2018-05-01 NOTE — Telephone Encounter (Signed)
Pt called concerned about her creatnine level taken on 10/07. Pt stated that she wanted a 2 week appt to recheck due to creatnine level has never been that high.

## 2018-05-06 ENCOUNTER — Other Ambulatory Visit: Payer: Self-pay | Admitting: Family Medicine

## 2018-05-06 DIAGNOSIS — M1A9XX0 Chronic gout, unspecified, without tophus (tophi): Secondary | ICD-10-CM

## 2018-05-09 ENCOUNTER — Telehealth: Payer: Self-pay | Admitting: *Deleted

## 2018-05-09 NOTE — Telephone Encounter (Signed)
Order to Decrease CPAP to 14 cm H20 and repeat D/L in 4 weeks Resmed Airfit P20 mask with chin strap faxed to [employer].

## 2018-05-12 ENCOUNTER — Ambulatory Visit (HOSPITAL_COMMUNITY)
Admission: RE | Admit: 2018-05-12 | Discharge: 2018-05-12 | Disposition: A | Discharge status: Home / Self Care | Payer: Medicare Other | Source: Ambulatory Visit | Attending: Internal Medicine | Admitting: Internal Medicine

## 2018-05-12 DIAGNOSIS — I5032 Chronic diastolic (congestive) heart failure: Secondary | ICD-10-CM | POA: Diagnosis present

## 2018-05-12 LAB — BASIC METABOLIC PANEL
ANION GAP: 11 (ref 5–15)
BUN: 70 mg/dL — ABNORMAL HIGH (ref 8–23)
CHLORIDE: 96 mmol/L — AB (ref 98–111)
CO2: 27 mmol/L (ref 22–32)
Calcium: 10.1 mg/dL (ref 8.9–10.3)
Creatinine, Ser: 2.07 mg/dL — ABNORMAL HIGH (ref 0.44–1.00)
GFR calc Af Amer: 28 mL/min — ABNORMAL LOW (ref >60)
GFR, EST NON AFRICAN AMERICAN: 24 mL/min — AB (ref >60)
GLUCOSE: 260 mg/dL — AB (ref 70–99)
Potassium: 5.1 mmol/L (ref 3.5–5.1)
Sodium: 134 mmol/L — ABNORMAL LOW (ref 135–145)

## 2018-05-19 ENCOUNTER — Encounter: Payer: Medicare Other | Admitting: *Deleted

## 2018-05-19 ENCOUNTER — Telehealth: Payer: Self-pay | Admitting: Internal Medicine

## 2018-05-19 ENCOUNTER — Telehealth: Payer: Self-pay | Admitting: Cardiology

## 2018-05-19 NOTE — Telephone Encounter (Signed)
Attempted to confirm remote transmission with pt. No answer and was unable to leave a message.  I left her my direct number for her to call and reschedule the remote appointment.

## 2018-05-19 NOTE — Telephone Encounter (Signed)
Patient forgot to do her transmission before she left to go out of town.   She would like to reschedule for sometime next week

## 2018-05-19 NOTE — Telephone Encounter (Signed)
LMOVM reminding pt to send remote transmission.   

## 2018-05-20 ENCOUNTER — Encounter: Payer: Self-pay | Admitting: Cardiology

## 2018-05-23 ENCOUNTER — Ambulatory Visit (INDEPENDENT_AMBULATORY_CARE_PROVIDER_SITE_OTHER): Payer: Medicare Other | Admitting: *Deleted

## 2018-05-23 ENCOUNTER — Telehealth: Payer: Self-pay | Admitting: Cardiology

## 2018-05-23 DIAGNOSIS — I495 Sick sinus syndrome: Secondary | ICD-10-CM

## 2018-05-23 NOTE — Telephone Encounter (Signed)
LMOVM reminding pt to send remote transmission.   

## 2018-05-24 NOTE — Progress Notes (Signed)
Remote pacemaker transmission.   

## 2018-05-26 ENCOUNTER — Ambulatory Visit (HOSPITAL_COMMUNITY)
Admission: RE | Admit: 2018-05-26 | Discharge: 2018-05-26 | Disposition: A | Discharge status: Home / Self Care | Payer: Medicare Other | Source: Ambulatory Visit | Attending: Cardiology | Admitting: Cardiology

## 2018-05-26 ENCOUNTER — Encounter: Payer: Self-pay | Admitting: Cardiology

## 2018-05-26 DIAGNOSIS — I5032 Chronic diastolic (congestive) heart failure: Secondary | ICD-10-CM | POA: Insufficient documentation

## 2018-05-26 LAB — BASIC METABOLIC PANEL
Anion gap: 9 (ref 5–15)
BUN: 61 mg/dL — AB (ref 8–23)
CHLORIDE: 99 mmol/L (ref 98–111)
CO2: 29 mmol/L (ref 22–32)
CREATININE: 1.78 mg/dL — AB (ref 0.44–1.00)
Calcium: 9.8 mg/dL (ref 8.9–10.3)
GFR, EST AFRICAN AMERICAN: 33 mL/min — AB (ref >60)
GFR, EST NON AFRICAN AMERICAN: 29 mL/min — AB (ref >60)
Glucose, Bld: 140 mg/dL — ABNORMAL HIGH (ref 70–99)
Potassium: 4.5 mmol/L (ref 3.5–5.1)
SODIUM: 137 mmol/L (ref 135–145)

## 2018-06-24 ENCOUNTER — Encounter: Payer: Self-pay | Admitting: Hematology

## 2018-06-24 ENCOUNTER — Inpatient Hospital Stay (HOSPITAL_COMMUNITY): Payer: Medicare Other | Attending: Hematology

## 2018-06-24 DIAGNOSIS — Z79899 Other long term (current) drug therapy: Secondary | ICD-10-CM | POA: Diagnosis not present

## 2018-06-24 DIAGNOSIS — M810 Age-related osteoporosis without current pathological fracture: Secondary | ICD-10-CM | POA: Insufficient documentation

## 2018-06-24 DIAGNOSIS — Z8571 Personal history of Hodgkin lymphoma: Secondary | ICD-10-CM | POA: Insufficient documentation

## 2018-06-24 DIAGNOSIS — Z85528 Personal history of other malignant neoplasm of kidney: Secondary | ICD-10-CM | POA: Insufficient documentation

## 2018-06-24 DIAGNOSIS — E785 Hyperlipidemia, unspecified: Secondary | ICD-10-CM | POA: Insufficient documentation

## 2018-06-24 DIAGNOSIS — Z9012 Acquired absence of left breast and nipple: Secondary | ICD-10-CM | POA: Diagnosis not present

## 2018-06-24 DIAGNOSIS — Z853 Personal history of malignant neoplasm of breast: Secondary | ICD-10-CM

## 2018-06-24 DIAGNOSIS — Z86000 Personal history of in-situ neoplasm of breast: Secondary | ICD-10-CM | POA: Insufficient documentation

## 2018-06-24 DIAGNOSIS — Z7982 Long term (current) use of aspirin: Secondary | ICD-10-CM | POA: Insufficient documentation

## 2018-06-24 DIAGNOSIS — D696 Thrombocytopenia, unspecified: Secondary | ICD-10-CM | POA: Diagnosis not present

## 2018-06-24 DIAGNOSIS — I5032 Chronic diastolic (congestive) heart failure: Secondary | ICD-10-CM | POA: Insufficient documentation

## 2018-06-24 DIAGNOSIS — I480 Paroxysmal atrial fibrillation: Secondary | ICD-10-CM | POA: Diagnosis not present

## 2018-06-24 DIAGNOSIS — I130 Hypertensive heart and chronic kidney disease with heart failure and stage 1 through stage 4 chronic kidney disease, or unspecified chronic kidney disease: Secondary | ICD-10-CM | POA: Insufficient documentation

## 2018-06-24 DIAGNOSIS — Z923 Personal history of irradiation: Secondary | ICD-10-CM | POA: Diagnosis not present

## 2018-06-24 DIAGNOSIS — Z905 Acquired absence of kidney: Secondary | ICD-10-CM | POA: Insufficient documentation

## 2018-06-24 DIAGNOSIS — Z950 Presence of cardiac pacemaker: Secondary | ICD-10-CM | POA: Diagnosis not present

## 2018-06-24 LAB — CBC WITH DIFFERENTIAL/PLATELET
Abs Immature Granulocytes: 0.05 10*3/uL (ref 0.00–0.07)
BASOS ABS: 0.1 10*3/uL (ref 0.0–0.1)
Basophils Relative: 1 %
EOS ABS: 0.1 10*3/uL (ref 0.0–0.5)
EOS PCT: 2 %
HCT: 39.8 % (ref 36.0–46.0)
Hemoglobin: 12.4 g/dL (ref 12.0–15.0)
IMMATURE GRANULOCYTES: 1 %
LYMPHS PCT: 16 %
Lymphs Abs: 1.4 10*3/uL (ref 0.7–4.0)
MCH: 29.2 pg (ref 26.0–34.0)
MCHC: 31.2 g/dL (ref 30.0–36.0)
MCV: 93.9 fL (ref 80.0–100.0)
MONO ABS: 0.5 10*3/uL (ref 0.1–1.0)
MONOS PCT: 6 %
Neutro Abs: 6.3 10*3/uL (ref 1.7–7.7)
Neutrophils Relative %: 74 %
Platelets: 129 10*3/uL — ABNORMAL LOW (ref 150–400)
RBC: 4.24 MIL/uL (ref 3.87–5.11)
RDW: 14.2 % (ref 11.5–15.5)
WBC: 8.4 10*3/uL (ref 4.0–10.5)
nRBC: 0 % (ref 0.0–0.2)

## 2018-06-24 LAB — COMPREHENSIVE METABOLIC PANEL
ALK PHOS: 142 U/L — AB (ref 38–126)
ALT: 25 U/L (ref 0–44)
AST: 37 U/L (ref 15–41)
Albumin: 4.6 g/dL (ref 3.5–5.0)
Anion gap: 9 (ref 5–15)
BUN: 56 mg/dL — AB (ref 8–23)
CALCIUM: 10.4 mg/dL — AB (ref 8.9–10.3)
CHLORIDE: 100 mmol/L (ref 98–111)
CO2: 28 mmol/L (ref 22–32)
CREATININE: 2.07 mg/dL — AB (ref 0.44–1.00)
GFR calc Af Amer: 28 mL/min — ABNORMAL LOW (ref >60)
GFR calc non Af Amer: 25 mL/min — ABNORMAL LOW (ref >60)
GLUCOSE: 227 mg/dL — AB (ref 70–99)
Potassium: 4.9 mmol/L (ref 3.5–5.1)
SODIUM: 137 mmol/L (ref 135–145)
Total Bilirubin: 0.9 mg/dL (ref 0.3–1.2)
Total Protein: 8.7 g/dL — ABNORMAL HIGH (ref 6.5–8.1)

## 2018-06-24 LAB — LACTATE DEHYDROGENASE: LDH: 163 U/L (ref 98–192)

## 2018-06-27 ENCOUNTER — Encounter (HOSPITAL_COMMUNITY): Payer: Self-pay | Admitting: Hematology

## 2018-06-27 ENCOUNTER — Other Ambulatory Visit: Payer: Self-pay

## 2018-06-27 ENCOUNTER — Inpatient Hospital Stay (HOSPITAL_BASED_OUTPATIENT_CLINIC_OR_DEPARTMENT_OTHER): Payer: Medicare Other | Admitting: Hematology

## 2018-06-27 DIAGNOSIS — Z8571 Personal history of Hodgkin lymphoma: Secondary | ICD-10-CM

## 2018-06-27 DIAGNOSIS — I130 Hypertensive heart and chronic kidney disease with heart failure and stage 1 through stage 4 chronic kidney disease, or unspecified chronic kidney disease: Secondary | ICD-10-CM

## 2018-06-27 DIAGNOSIS — Z950 Presence of cardiac pacemaker: Secondary | ICD-10-CM

## 2018-06-27 DIAGNOSIS — I5032 Chronic diastolic (congestive) heart failure: Secondary | ICD-10-CM

## 2018-06-27 DIAGNOSIS — Z86000 Personal history of in-situ neoplasm of breast: Secondary | ICD-10-CM

## 2018-06-27 DIAGNOSIS — M810 Age-related osteoporosis without current pathological fracture: Secondary | ICD-10-CM

## 2018-06-27 DIAGNOSIS — Z85528 Personal history of other malignant neoplasm of kidney: Secondary | ICD-10-CM

## 2018-06-27 DIAGNOSIS — E785 Hyperlipidemia, unspecified: Secondary | ICD-10-CM

## 2018-06-27 DIAGNOSIS — Z9012 Acquired absence of left breast and nipple: Secondary | ICD-10-CM

## 2018-06-27 DIAGNOSIS — D696 Thrombocytopenia, unspecified: Secondary | ICD-10-CM

## 2018-06-27 DIAGNOSIS — Z7982 Long term (current) use of aspirin: Secondary | ICD-10-CM

## 2018-06-27 DIAGNOSIS — Z923 Personal history of irradiation: Secondary | ICD-10-CM | POA: Diagnosis not present

## 2018-06-27 DIAGNOSIS — Z905 Acquired absence of kidney: Secondary | ICD-10-CM

## 2018-06-27 DIAGNOSIS — I480 Paroxysmal atrial fibrillation: Secondary | ICD-10-CM

## 2018-06-27 DIAGNOSIS — Z79899 Other long term (current) drug therapy: Secondary | ICD-10-CM

## 2018-06-27 NOTE — Assessment & Plan Note (Signed)
1.  Mild thrombocytopenia: - Connective tissue work-up negative. -CT abdomen pelvis did not show any spleno megaly.  Peripheral blood smear shows giant platelets. - This is clinically consistent with immune thrombocytopenia.  She does not have any bleeding complications. -We reviewed her blood counts today.  Platelet count is stable at 129. -She bruises easily as she is on Brilinta.  Will monitor her platelets every 6 months.  2.  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  She is not on tamoxifen therapy. -Today's examination shows left mastectomy site is within normal limits.  Right breast has no palpable masses.  -she reportedly had mammogram done at [facility] in [place].  We will obtain the reports and review them.   3   Stage IIb Hodgkin's disease: -She received mantle radiation in 1981.  She continues to be remission since then. -She does not have any B symptoms.  No palpable lymphadenopathy or splenomegaly.    4.  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 -I have reviewed CT scan dated 04/14/2018 which shows stable 14 mm left renal lesion.  5.  Osteoporosis: Last bone density test on 12/25/2017 showed left hip T score of -3.2.  She is taking calcium and vitamin D.  She was never treated with bisphosphonates.  She follows up with endocrinologist at [facility].

## 2018-06-27 NOTE — Patient Instructions (Signed)
[facility] Discharge Instructions     Thank you for choosing [facility] at [facility] to provide your oncology and hematology care.  To afford each patient quality time with our provider, please arrive at least 15 minutes before your scheduled appointment time.   If you have a lab appointment with the Cancer Center please come in thru the  Main Entrance and check in at the main information desk  You need to re-schedule your appointment should you arrive 10 or more minutes late.  We strive to give you quality time with our providers, and arriving late affects you and other patients whose appointments are after yours.  Also, if you no show three or more times for appointments you may be dismissed from the clinic at the providers discretion.     Again, thank you for choosing [facility].  Our hope is that these requests will decrease the amount of time that you wait before being seen by our physicians.       _____________________________________________________________  Should you have questions after your visit to  Cancer Center, please contact our office at [phone] between the hours of 8:00 a.m. and 4:30 p.m.  Voicemails left after 4:00 p.m. will not be returned until the following business day.  For prescription refill requests, have your pharmacy contact our office and allow 72 hours.    Cancer Center Support Programs:   > Cancer Support Group  2nd Tuesday of the month 1pm-2pm, Journey Room    

## 2018-06-27 NOTE — Progress Notes (Signed)
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  CURRENT THERAPY: observation    INTERVAL HISTORY:  [name] 67 y.o. female returns for routine follow-up thrombocytopenia and left breast DCIS. She is here today and doing well. She is closely followed by her Nephrologist for her kidney diease. She had her mammogram done by an outside site and we will obtain the records. She denies any new pains or lumps present. Denies any nausea, vomiting, or diarrhea. Denies any fevers or recent infections. Denies any     REVIEW OF SYSTEMS:  Review of Systems - Oncology   PAST MEDICAL/SURGICAL HISTORY:  Past Medical History:  Diagnosis Date  . Anxiety   . Aortic stenosis    a. bicuspid aortic valve; mean gradient of 20 mmHg in 2/10; b. s/p bioprosth AVR (19-mm Edwards pericardial valve) with a redo coronary artery bypass graft procedure in 07/2009; postoperative Dressler's syndrome    . Arthritis   . Breast cancer ([facility])   . Carcinoma, renal cell ([facility]) 05/2007   Laparoscopic right nephrectomy  . 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 Chronic diastolic CHF (congestive heart failure) ([place])    a. 9.2014 EchoP EF 60-65%, no rwma, bioprosth AVR, mean gradient of 65mHg, mild to mod MR, PASP 576mg.  . CKD (chronic kidney disease), stage II   . Colitis, ischemic ([facility]CF[facility]2008  . Coronary artery disease    a. initial CABG with SVG-RCA in 12/05. b. redo SVG-RCA and bioprosthetic AVR in 1/11. d. 9/14 - PCI with DES to distal LM/proximal LAD was done rather than bypass as she would have been a poor candidate for redo sternotomy. d. S/p DES to prox LM 05/2014.  . Diabetes mellitus 03/2010    TYPE II: Hemoglobin A1c of 7.4 in  03/2010; 8.4 in 06/2010; treated with insulin pump  . Duodenal  ulcer    Remote; H. pylori positive  . Gastroparesis due to DM ([facility]10/04/2012  . Genetic testing 01/14/2017   Ms. Dalpe underwent genetic counseling and testing for hereditary cancer syndromes on 01/01/2017. Her results were negative for mutations in all 83 genes analyzed by [facility]'s 83-gene Common Hereditary Cancers Panel. Genes analyzed include: ALK, APC, ATM, AXIN2, BAP1, BARD1, BLM, BMPR1A, BRCA1, BRCA2, BRIP1, CASR, CDC73, CDH1, CDK4, CDKN1B, CDKN1C, CDKN2A, CEBPA, CHEK2, CTNNA1, DICER1, DIS3L2,   . Hodgkin's disease ([facility]CL[facility] Quint[facility]ron[facility]iz[facility]1991   Mantle radiation therapy  . Hodgkin's disease (HCM[facility]  . Hyperlipidemia   . Hyperplastic gastric polyp 06/23/2012  . Hypertension   . Iron deficiency anemia    a. 03/2013 EGD: essentially normal. Possible slow GIB.  . Migraines   . NASH (nonalcoholic steatohepatitis) 1999   -biopsy in 1999  . OSA on CPAP 04/24/2017   Moderate with AHI 22/hr now on CPAP at 18cm H2o  . Osteopenia     hip on DEXA in October 2007.  . Osteoporosis   . Oxygen deficiency   . Pacemaker    six sinus syndrome  . PAF (paroxysmal atrial fibrillation) (HCDouglass [facility]   a.  h/o brief episodes noted on ppm interrogation. b. given GI bleeding and need to be on both ASA 81 and Brilinta, risks likely outweigh benefits of anticoagulation.   . Small bowel obstruction (HCC)    Recurrent; resolved after resection of a  lipoma  . Syncope    a. H/o recurrent syncope with pauses on loop recorder s/p Medtronic pacemaker 07/2012.   Past Surgical History:  Procedure Laterality Date  . ABDOMINAL HYSTERECTOMY    . AORTIC VALVE REPLACEMENT  2011   Aortic valve replacement surgery with a 19 mm bioprosthetic device post redo CABG January 2011.  Marland Kitchen BONE MARROW BIOPSY    . BREAST EXCISIONAL BIOPSY     benign, bilateral  . CARDIAC CATHETERIZATION    . CERVICAL BIOPSY    . COLONOSCOPY     Multiple with adenomatous polyps  . COLONOSCOPY  06/23/2012   Procedure: COLONOSCOPY;  Surgeon: Gatha Mayer, MD;   Location: [facility];  Service: Endoscopy;  Laterality: N/A;  . CORONARY ARTERY BYPASS GRAFT  2005, 2011   CABG-most recent SVG to RCA-12/05; RCA occluded with patent graft in 2006; Redo bypass in 07/2009  . ESOPHAGOGASTRODUODENOSCOPY    . ESOPHAGOGASTRODUODENOSCOPY  06/23/2012   Procedure: ESOPHAGOGASTRODUODENOSCOPY (EGD);  Surgeon: Gatha Mayer, MD;  Location: [facility];  Service: Endoscopy;  Laterality: N/A;  . ESOPHAGOGASTRODUODENOSCOPY N/A 04/06/2013   Procedure: ESOPHAGOGASTRODUODENOSCOPY (EGD);  Surgeon: Irene Shipper, MD;  Location: [facility] ENDOSCOPY;  Service: Endoscopy;  Laterality: N/A;  . EXPLORATORY LAPAROTOMY W/ BOWEL RESECTION     Small bowel resection of lipoma  . GIVENS CAPSULE STUDY N/A 04/30/2013   Procedure: GIVENS CAPSULE STUDY;  Surgeon: Gatha Mayer, MD;  Location: [facility];  Service: Endoscopy;  Laterality: N/A;  . LEFT AND RIGHT HEART CATHETERIZATION WITH CORONARY ANGIOGRAM N/A 04/07/2013   Procedure: LEFT AND RIGHT HEART CATHETERIZATION WITH CORONARY ANGIOGRAM;  Surgeon: Larey Dresser, MD;  Location: [facility] CATH LAB;  Service: Cardiovascular;  Laterality: N/A;  . LEFT ATRIAL APPENDAGE OCCLUSION N/A 02/16/2016   Watchman not placed - nickel allergy  . LEFT HEART CATHETERIZATION WITH CORONARY/GRAFT ANGIOGRAM N/A 06/07/2014   Procedure: LEFT HEART CATHETERIZATION WITH Beatrix Fetters;  Surgeon: Burnell Blanks, MD;  Location: [facility] CATH LAB;  Service: Cardiovascular;  Laterality: N/A;  . LIVER BIOPSY    . LOOP RECORDER IMPLANT N/A 12/10/2011   Procedure: LOOP RECORDER IMPLANT;  Surgeon: Evans Lance, MD;  Location: [facility] CATH LAB;  Service: Cardiovascular;  Laterality: N/A;  . MALONEY DILATION  06/23/2012   Procedure: [name] DILATION;  Surgeon: Gatha Mayer, MD;  Location: [facility] ENDOSCOPY;  Service: Endoscopy;;  54 fr  . MASTECTOMY Left 05/17/2015   total   . NEPHRECTOMY  2008   Laparoscopic right nephrectomy, renal cell carcinoma  . PACEMAKER INSERTION   08/21/2012   Medtronic Adapta L dual-chamber pacemaker  . PERCUTANEOUS CORONARY STENT INTERVENTION (PCI-S) N/A 04/09/2013   Procedure: PERCUTANEOUS CORONARY STENT INTERVENTION (PCI-S);  Surgeon: Burnell Blanks, MD;  Location: [facility] CATH LAB;  Service: Cardiovascular;  Laterality: N/A;  . PERMANENT PACEMAKER INSERTION N/A 08/21/2012   Procedure: PERMANENT PACEMAKER INSERTION;  Surgeon: Evans Lance, MD;  Location: [facility] CATH LAB;  Service: Cardiovascular;  Laterality: N/A;  . RIGHT HEART CATH AND CORONARY/GRAFT ANGIOGRAPHY N/A 04/19/2017   Procedure: RIGHT HEART CATH AND CORONARY/GRAFT ANGIOGRAPHY;  Surgeon: Larey Dresser, MD;  Location: [facility] CV LAB;  Service: Cardiovascular;  Laterality: N/A;  . TEE WITHOUT CARDIOVERSION N/A 12/07/2015   Procedure: TRANSESOPHAGEAL ECHOCARDIOGRAM (TEE);  Surgeon: Larey Dresser, MD;  Location: [facility];  Service: Cardiovascular;  Laterality: N/A;  . TOTAL ABDOMINAL HYSTERECTOMY W/ BILATERAL SALPINGOOPHORECTOMY  2000   endometriosis and ovarian cysts  . TOTAL MASTECTOMY Left 05/17/2015  Procedure: LEFT TOTAL MASTECTOMY;  Surgeon: Rolm Bookbinder, MD;  Location: [facility];  Service: General;  Laterality: Left;  . TUMOR EXCISION  1981   removal of Hodgkins lymphoma     SOCIAL HISTORY:  Social History   Socioeconomic History  . Marital status: Married    Spouse name: Not on file  . Number of children: 2  . Years of education: Not on file  . Highest education level: Not on file  Occupational History  . Occupation: retired    Comment: elementary principal  Social Needs  . Financial resource strain: Not on file  . Food insecurity:    Worry: Not on file    Inability: Not on file  . Transportation needs:    Medical: Not on file    Non-medical: Not on file  Tobacco Use  . Smoking status: Never Smoker  . Smokeless tobacco: Never Used  Substance and Sexual Activity  . Alcohol use: No    Alcohol/week: 0.0 standard drinks  . Drug use: No    . Sexual activity: Yes  Lifestyle  . Physical activity:    Days per week: Not on file    Minutes per session: Not on file  . Stress: Not on file  Relationships  . Social connections:    Talks on phone: Not on file    Gets together: Not on file    Attends religious service: Not on file    Active member of club or organization: Not on file    Attends meetings of clubs or organizations: Not on file    Relationship status: Not on file  . Intimate partner violence:    Fear of current or ex partner: Not on file    Emotionally abused: Not on file    Physically abused: Not on file    Forced sexual activity: Not on file  Other Topics Concern  . Not on file  Social History Narrative   Retired [employer] principal, married    Started disability September 2013      Has one daughter in [place], works at [employer]. Has a son, Aaron Edelman, who is Retail buyer.   Eats all food groups.   Wear seatbelt.    Attends church. [place], [place]. [employer].     FAMILY HISTORY:  Family History  Problem Relation Age of Onset  . Heart attack Mother        d.61  . Heart disease Mother   . Heart attack Father        d.60  . Diabetes Father   . Heart disease Father   . Lung cancer Sister        d.61 metastases to brain. History of smoking. Maternal half-sister.  . Cancer Sister 30       unspecified gynecologic cancer (either cervical or ovarian) in her 59s.  Marland Kitchen Heart attack Maternal Grandmother   . Cancer Maternal Grandfather 11       d.52s oral cancer  . Hypertension Brother   . Glaucoma Brother   . Breast cancer Sister 74       d.42 from metastatic disease. Paternal half-sister.  . Colon cancer Neg Hx   . Stomach cancer Neg Hx     CURRENT MEDICATIONS:  Outpatient Encounter Medications as of 06/27/2018  Medication Sig  . Alirocumab (PRALUENT) 150 MG/ML SOPN Inject 1 pen into the skin every 14 (fourteen) days.  . ALPRAZolam (XANAX) 0.5 MG tablet Take 1 tablet (0.5 mg total) by mouth 2 (two)  times daily as needed for anxiety.  Marland Kitchen amLODipine (NORVASC) 2.5 MG tablet Take 1 tablet (2.5 mg total) by mouth daily.  . APIDRA 100 UNIT/ML injection Inject 46 Units into the skin as directed. INFUSE THROUGH INSULIN PUMP UTD::[place] 46.15 units daily, Bolus depends on carb intake  . aspirin EC 81 MG tablet Take 81 mg by mouth at bedtime.  . Calcium Carbonate-Vitamin D (RA CALCIUM PLUS VITAMIN D) 600-400 MG-UNIT per tablet Take 1 tablet by mouth daily.   . Cholecalciferol 2000 UNITS TABS Take 1 tablet by mouth every morning.   . clobetasol cream (TEMOVATE) 1.93 % Apply 1 application topically 2 (two) times daily as needed.   . Coenzyme Q10 (CO Q 10) 100 MG CAPS Take 1 capsule by mouth at bedtime.   . colchicine 0.6 MG tablet Take 0.6 mg by mouth daily as needed (gout flare).  Marland Kitchen docusate sodium (COLACE) 100 MG capsule Take 100 mg by mouth every other day.   . famotidine (PEPCID) 40 MG tablet Take 40 mg by mouth 3 times/day as needed-between meals & bedtime for heartburn or indigestion.  . Fe Fum-FA-B Cmp-C-Zn-Mg-Mn-Cu (HEMATINIC PLUS VIT/MINERALS) 106-1 MG TABS TAKE 1 TABLET BY MOUTH THREE TIMES A WEEK(MONDAY, WEDNESDAY, AND FRIDAY) OR AS DIRECTED WITH A VITAMIN "C"  . fluticasone (FLONASE) 50 MCG/ACT nasal spray Place 2 sprays into both nostrils daily. (Patient taking differently: Place 2 sprays into both nostrils daily as needed. )  . glucagon (GLUCAGON EMERGENCY) 1 MG injection Inject 1 mg into the vein once as needed (for severe reaction). Reported on 12/05/2015  . Icosapent Ethyl (VASCEPA) 1 g CAPS Take 2 capsules (2 g total) by mouth 2 (two) times daily.  . isosorbide mononitrate (IMDUR) 120 MG 24 hr tablet Take 1 tablet (120 mg total) by mouth daily.  Marland Kitchen latanoprost (XALATAN) 0.005 % ophthalmic solution Place 1 drop into both eyes at bedtime.  . Magnesium Oxide 250 MG TABS Take 500 mg by mouth at bedtime.   . metolazone (ZAROXOLYN) 2.5 MG tablet Take 2.5 mg (1 tab) once as directed by CHF clinic.  Call before taking [phone]  . metoprolol tartrate (LOPRESSOR) 50 MG tablet TAKE 1 AND 1/2 TABLETS(75 MG) BY MOUTH TWICE DAILY  . Multiple Vitamins-Minerals (MULTIVITAMIN WITH MINERALS) tablet Take 1 tablet by mouth every morning.   . mupirocin cream (BACTROBAN) 2 % Apply 1 application topically 2 (two) times daily as needed.  . nitroGLYCERIN (NITROLINGUAL) 0.4 MG/SPRAY spray PLACE 1 SPRAY UNDER THE TONGUE EVERY 5 MINUTES FOR 3 DOSES AS NEEDED FOR CHEST PAIN  . Polyethyl Glycol-Propyl Glycol (SYSTANE) 0.4-0.3 % SOLN Apply 1 drop to eye daily as needed (dry eyes).  . potassium chloride (MICRO-K) 10 MEQ CR capsule Take 2 capsules (20 mEq total) by mouth daily.  . pravastatin (PRAVACHOL) 10 MG tablet Take 1 tablet (10 mg total) by mouth daily.  Marland Kitchen spironolactone (ALDACTONE) 25 MG tablet Take 1 tablet (25 mg total) by mouth daily.  . ticagrelor (BRILINTA) 60 MG TABS tablet TAKE 1 TABLET(60 MG) BY MOUTH TWICE DAILY  . torsemide (DEMADEX) 20 MG tablet Take 3 tablets (60 mg total) by mouth daily. Take 88m. May take an extra as needed in the PM for wt gain/swelling.  . triamcinolone cream (KENALOG) 0.5 % Apply 1 application topically daily as needed (inflamation).   .Marland KitchenULORIC 40 MG tablet Take 1 tablet (40 mg total) by mouth at bedtime.  .Marland KitchenUNABLE TO FIND Med Name: So Clean CPAP cleaning system  . [  DISCONTINUED] pantoprazole (PROTONIX) 40 MG tablet TAKE 1 TABLET(40 MG) BY MOUTH DAILY   No facility-administered encounter medications on file as of 06/27/2018.     ALLERGIES:  Allergies  Allergen Reactions  . Avandia [Rosiglitazone Maleate] Other (See Comments)    Congestive heart failure  . Cephalexin Anaphylaxis, Swelling and Rash    Extremities swell , throat swelling   . Clindamycin Hcl Anaphylaxis and Shortness Of Breath  . Lincomycin Anaphylaxis and Shortness Of Breath  . Metformin Other (See Comments)    ? Congestive heart failure ? Congestive heart failure  . Novolog [Insulin Aspart]  Hives and Itching    Humalog & Novolog big knots and whelp    . Penicillins Anaphylaxis, Hives, Swelling and Rash    Has patient had a PCN reaction causing immediate rash, facial/tongue/throat swelling, SOB or lightheadedness with hypotension: Yes Has patient had a PCN reaction causing severe rash involving mucus membranes or skin necrosis: No Has patient had a PCN reaction that required hospitalization No Has patient had a PCN reaction occurring within the last 10 years: No If all of the above answers are "NO", then may proceed with Cephalosporin use.   Marland Kitchen Rosiglitazone Other (See Comments)    Congestive heart failure   Congestive heart failure   . Tizanidine Other (See Comments)    Dizziness, Mental Status Changes, Hallucination    . Adhesive [Tape] Other (See Comments)    Tears skin, Please use "paper" tape  . Atorvastatin Other (See Comments)    increased LFT's, no muscle weakness, leg pain Muscle and joint pain increased LFT's  Increased LFTs  . Cholestyramine Other (See Comments)    Liver Disorder Elevated LFTs  Elevated LFT's   . Gentamycin [Gentamicin] Hives, Itching and Rash    Fine red bumps.  Reaction noted post loop recorder implant, where gentamycin was used for irrigation. Topical prep  . Humalog [Insulin Lispro] Hives, Itching and Other (See Comments)    Pt uses Apidra   . Iodinated Diagnostic Agents Other (See Comments)    PAIN DURING LYMPHANGIOGRAM '81, NO ALLERGY TO IV CONTRAST. PAIN DURING LYMPHANGIOGRAM '86, NO ALLERGY TO IV CONTRAST PAIN DURING LYMPHANGIOGRAM '81, NO ALLERGY TO IV CONTRAST.Has had procedures with Iodine since without problems. PAIN DURING LYMPHANGIOGRAM '86, NO ALLERGY TO IV CONTRAST  . Limonene Hives, Itching, Rash and Other (See Comments)    Reaction noted post loop recorder implant, where gentamycin was used for irrigation. Topical prep  . Pravastatin Other (See Comments)    In high doses (20-52m) causes muscle and joint pain    Per pt. "can only take in low doses".   . Prednisone Itching and Other (See Comments)    B/P went high, itching all over   . Ranexa [Ranolazine] Other (See Comments)    Nausea, dizziness, low blood sugar, tingling in right hand, "blood blisters" on tongue   . Rosuvastatin Nausea And Vomiting, Nausea Only and Other (See Comments)    Muscle and joint pain & increased LFTs; tolerates Pravastatin 169m(max)  . Simvastatin Other (See Comments)    Increased LFT's Increased LFTs  . Strawberry Extract Hives, Itching, Rash and Other (See Comments)    Reaction noted post loop recorder implant, where gentamycin was used for irrigation. Topical prep  . Clopidogrel Other (See Comments)    Does not work for patient Medicine was not effective  . Doxycycline     Blisters in mouth  . Plavix [Clopidogrel Bisulfate] Other (See Comments)    P2Y12  testing = 271 while on Plavix  . Codeine Nausea And Vomiting  . Erythromycin Nausea And Vomiting  . Hydrocodone-Acetaminophen Itching, Rash and Other (See Comments)    itching  . Latex Itching and Other (See Comments)    I"if wear gloves hands get red ,itchy no prioblem if other wear and touch me"  . Neomycin Rash and Other (See Comments)    Redness  . Nickel Itching  . Ranitidine Nausea Only  . Tamiflu [Oseltamivir] Nausea And Vomiting  . Tramadol Nausea Only     PHYSICAL EXAM:  ECOG Performance status: 1  Vitals:   06/27/18 1126  BP: (!) 126/56  Pulse: 75  Resp: 18  Temp: (!) 97.4 F (36.3 C)  SpO2: 100%   Filed Weights   06/27/18 1126  Weight: 116 lb 8 oz (52.8 kg)    Physical Exam HEENT: Oropharynx has no thrush. Chest: Bilaterally clear to auscultation. CVS: Regular rate and rhythm.  Left chest pacemaker present. Breast exam: Left mastectomy site is within normal limits.  Right breast has no palpable masses. Abdomen: No palpable hepatosplenomegaly or other masses. Lymphadenopathy: No palpable adenopathy in the neck, axilla or  inguinal region. Extremities: No edema or cyanosis.  LABORATORY DATA:  I have reviewed the labs as listed.  CBC    Component Value Date/Time   WBC 8.4 06/24/2018 1042   RBC 4.24 06/24/2018 1042   HGB 12.4 06/24/2018 1042   HGB 13.4 12/13/2016 1427   HCT 39.8 06/24/2018 1042   HCT 40.3 12/13/2016 1427   PLT 129 (L) 06/24/2018 1042   PLT 107 (L) 12/13/2016 1427   MCV 93.9 06/24/2018 1042   MCV 88.8 12/13/2016 1427   MCH 29.2 06/24/2018 1042   MCHC 31.2 06/24/2018 1042   RDW 14.2 06/24/2018 1042   RDW 14.6 (H) 12/13/2016 1427   LYMPHSABS 1.4 06/24/2018 1042   LYMPHSABS 1.2 12/13/2016 1427   MONOABS 0.5 06/24/2018 1042   MONOABS 0.8 12/13/2016 1427   EOSABS 0.1 06/24/2018 1042   EOSABS 0.2 12/13/2016 1427   BASOSABS 0.1 06/24/2018 1042   BASOSABS 0.1 12/13/2016 1427   CMP Latest Ref Rng & Units 06/24/2018 05/26/2018 05/12/2018  Glucose 70 - 99 mg/dL 227(H) 140(H) 260(H)  BUN 8 - 23 mg/dL 56(H) 61(H) 70(H)  Creatinine 0.44 - 1.00 mg/dL 2.07(H) 1.78(H) 2.07(H)  Sodium 135 - 145 mmol/L 137 137 134(L)  Potassium 3.5 - 5.1 mmol/L 4.9 4.5 5.1  Chloride 98 - 111 mmol/L 100 99 96(L)  CO2 22 - 32 mmol/L 28 29 27   Calcium 8.9 - 10.3 mg/dL 10.4(H) 9.8 10.1  Total Protein 6.5 - 8.1 g/dL 8.7(H) - -  Total Bilirubin 0.3 - 1.2 mg/dL 0.9 - -  Alkaline Phos 38 - 126 U/L 142(H) - -  AST 15 - 41 U/L 37 - -  ALT 0 - 44 U/L 25 - -       DIAGNOSTIC IMAGING:  I have independently reviewed the scans and discussed with the patient. .  I have reviewed Francene Finders, NP's note and agree with the documentation.  I personally performed a face-to-face visit, made revisions and my assessment and plan is as follows.     ASSESSMENT & PLAN:   Thrombocytopenia ([place]) 1.  Mild thrombocytopenia: - Connective tissue work-up negative. -CT abdomen pelvis did not show any spleno megaly.  Peripheral blood smear shows giant platelets. - This is clinically consistent with immune thrombocytopenia.  She  does not have any bleeding complications. -We reviewed her  blood counts today.  Platelet count is stable at 129. -She bruises easily as she is on Brilinta.  Will monitor her platelets every 6 months.  2.  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  She is not on tamoxifen therapy. -Today's examination shows left mastectomy site is within normal limits.  Right breast has no palpable masses.  -she reportedly had mammogram done at [facility] in [place].  We will obtain the reports and review them.   3   Stage IIb Hodgkin's disease: -She received mantle radiation in 1981.  She continues to be remission since then. -She does not have any B symptoms.  No palpable lymphadenopathy or splenomegaly.    4.  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 -I have reviewed CT scan dated 04/14/2018 which shows stable 14 mm left renal lesion.  5.  Osteoporosis: Last bone density test on 12/25/2017 showed left hip T score of -3.2.  She is taking calcium and vitamin D.  She was never treated with bisphosphonates.  She follows up with endocrinologist at [facility].      Orders placed this encounter:  No orders of the defined types were placed in this encounter.     Derek Jack, MD [place] [phone]

## 2018-07-01 ENCOUNTER — Ambulatory Visit (HOSPITAL_COMMUNITY): Payer: Medicare Other | Admitting: Hematology

## 2018-07-04 ENCOUNTER — Telehealth (HOSPITAL_COMMUNITY): Payer: Self-pay | Admitting: Hematology

## 2018-07-04 NOTE — Telephone Encounter (Signed)
Patient called to request recent lab results be faxed Dr [name] at [facility]  Report printed and faxed to [phone].  Fax transmission confirmation received.

## 2018-07-09 ENCOUNTER — Encounter (HOSPITAL_COMMUNITY): Payer: Self-pay | Admitting: Cardiology

## 2018-07-09 ENCOUNTER — Ambulatory Visit (HOSPITAL_COMMUNITY)
Admission: RE | Admit: 2018-07-09 | Discharge: 2018-07-09 | Disposition: A | Discharge status: Home / Self Care | Payer: Medicare Other | Source: Ambulatory Visit | Attending: Cardiology | Admitting: Cardiology

## 2018-07-09 VITALS — BP 118/52 | HR 71 | Wt 115.2 lb

## 2018-07-09 DIAGNOSIS — N184 Chronic kidney disease, stage 4 (severe): Secondary | ICD-10-CM

## 2018-07-09 DIAGNOSIS — I5032 Chronic diastolic (congestive) heart failure: Secondary | ICD-10-CM | POA: Diagnosis not present

## 2018-07-09 DIAGNOSIS — I480 Paroxysmal atrial fibrillation: Secondary | ICD-10-CM

## 2018-07-09 DIAGNOSIS — R9431 Abnormal electrocardiogram [ECG] [EKG]: Secondary | ICD-10-CM | POA: Insufficient documentation

## 2018-07-09 MED ORDER — SPIRONOLACTONE 25 MG PO TABS: 12.5000 mg | ORAL_TABLET | Freq: Every day | ORAL | 1 refills | Status: DC

## 2018-07-09 NOTE — Patient Instructions (Signed)
Today you have been seen at the Heart failure clinic at [facility]   Medication changes: Decrease spirolactone to 12.5 mg daily   EKG  done today:  Follow up with Dr. Aundra Dubin  in 3 months.   You have the following test scheduled for:  ECHO   Your physician has requested that you have an echocardiogram. Echocardiography is a painless test that uses sound waves to create images of your heart. It provides your doctor with information about the size and shape of your heart and how well your heart's chambers and valves are working. This procedure takes approximately one hour. There are no restrictions for this procedure.

## 2018-07-10 NOTE — Progress Notes (Signed)
Patient ID: [name] [name], female   DOB: June 23, 1951, 67 y.o.   MRN: [id number] PCP: [name]  Cardiology: Dr. Aundra Dubin  67 y.o. with history of CAD s/p CABG in 12/05 and redo in 1/11, bioprosthetic AVR, diastolic CHF, and sick sinus syndrome s/p PCM presents for cardiology followup.  She had initial CABG with SVG-RCA in 12/05 followed by redo SVG-RCA and bioprosthetic AVR in 1/11.  She has had diastolic CHF and is on Lasix.  In 9/14, she had a CPX test.  This showed severe functional limitation with ischemic ECG changes (inferolateral ST depression and ST elevation in V1 and V2). She had chest pain.  She had R/LHC in 9/14.  This showed elevated left and right heart filling pressures, patent SVG-PDA, and 80% distal LM/80% ostial LAD stenosis.  She was not a candidate for redo CABG (had 2 prior sternotomies as well as chest radiation for Hodgkins lymphoma).  She had DES from left main into proximal LAD and will need long-term DAPT => Brilinta was used as she has been a poor responder to Plavix in the past.  She re-developed angina in 11/15 and was admitted with unstable angina.  LHC showed 80% ostial LM and patent SVG-RCA. She had DES to ostial LM. Echo in 1/18 showed EF 60-65%, normal bioprosthetic aortic valve, mild-moderate MR, PASP 35 mmHg.   She has chronic iron deficiency anemia thought to be due to a slow GI bleed.  Last EGD was unremarkable and a capsule endoscopy was also unremarkable.  She has had periodic transfusions.    She had bilateral mastectomy for DCIS in 94/85 without complication.  She has an insulin pump followed by an endocrinologist at [facility].   Given myalgias with statins, she was started on Praluent.  LDL is now excellent.    In the past she has had short runs of atrial fibrillation.  She is not anticoagulated. Planned for Watchman placement, but given her nickel allergy, we were unable to place a Watchman.     Diagnosed with OSA, now on CPAP.   Given ongoing problems with  exertional dyspnea and occasional chest pain, Cardiolite was repeated in 9/18 and showed inferior changes.  She underwent left/right heart cath in 9/18.  This showed elevated filling pressures, R>L.  There was 75+% ostial LCx stenosis.  However, there were 2 layers of stent from the mid left main into the ostial LAD, and LCx intervention was thought to be very risky.  It was decided to manage her medically.  Echo was repeated in 10/18 showing EF 55-60%, moderate to severe TR.    Given recurrent chest pain, she had ETT-Cardiolite done in 3/19.  This was a normal study with no evidence for ischemia or infarction.   She returns today for follow up of CHF and CAD.  Weight is down 3 lbs.  Symptoms wax and wane, generally does ok if she walks slowly, can walk up to 1/4 mile without stopping.  She gets short of breath walking fast.  Rare atypical chest pain.  No orthopnea/PND. No palpitations or lightheadedness.  She is worried about slowly worsening renal function, follows with Dr. Lorrene Reid for nephrology.   ECG (personally reviewed): NSR, 1st degree AVB, LAFB  Labs (7/14): hemoglobin 11 Labs (9/14): K 3.9, creatinine 1.48, hemoglobin 7.3 Labs (10/14): hemoglobin 7.7 Labs (11/14): K 3.8, creatinine 1.5 Labs (12/14): HCT 37.7 Labs (1/15): LDL 106, HDL 41, K 3.9, creatinine 1.6, LFTs normal Labs (2/15): K 3.7, creatinine 1.1, BNP 71 Labs (  3/15): HCT 36.7 Labs (4/15): K 3.7, creatinine 1.1, HCT 35.5 Labs (7/15): HCT 36.4 Labs (11/15): HCT 32.1, K 4.2, creatinine 1.06 Labs (12/15): hgb 12 Labs (1/16): HCT 37.1 Labs (3/16): LDL 25, HDL 42, elevated transaminases Labs (6/16): HCT 39.6, plts 146 Labs (7/16): LDL 59, HDL 41, AST 34, ALT 49, AP 125 Labs (11/16): HCT 38.8, alkaline phosphatase 156, AST/ALT normal, K 4, creatinine 1.0 Labs (3/17): K 4.4, creatinine 1.4, LFTs normal, hgb 12.5 Labs (5/17): HCT 41.8, K 4, creatinine 1.3 Labs (9/17): K 4, creatinine 0.43, hgb 13.6 Labs (10/17): LDL 42, HDL  43 Labs (1/18): K 4.3, creatinine 1.1 => 1.19, AST 66, ALT 67, hgb 14.2, BNP 72 Labs (2/18): K 3.9, creatinine 1.2, BNP 49.6, HCT 42.1 Labs (4/18): hgb 13.8 Labs (5/18): HCT 41.7, plts 98, K 4.3, creatinine 1.11, BNP 180 Labs (7/18): hgb 12, plts 110, K 4, creatinine 1.23, AST 38, ALT 44 Labs (8/18): K 5, creatinine 1.17 => 2.1, BUN 33 => 92, BNP 134 Labs (9/18): K 3.6, creatinine 1.42 => 1.44. Hgb 14 => 13.5 Labs (10/18): K 3.5, creatinine 1.21, hgb 11.9, LDL 16, HDL 40 Labs (12/18): K 3.7, creatinine 1.4 Labs (2/19): hgb 13.2 Labs @ [facility] (3/19): K 4.7, creatinine 1.4, ANA negative, RF negative Labs (4/19): K 3.9, creatinine 1.52 => 1.63 Labs (6/19): LDL 8, HDL 42, TGs 259 Labs (9/19): K 4.9, creatinine 1.65, hgb 13.4, plts 129, LDL 46,TGs 182 Labs (12/19): K 4.9, creatinine 2.07, hgb 12.4  PMH: 1. Sick sinus syndrome s/p [employer] PCM.  2. HTN 3. H/o SBO 4. Renal cell carcinoma s/p right nephrectomy in 2008.  5. 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  Fibroscan in 11/14 showed advanced fibrosis concerning for cirrhosis. EGD in 11/14 showed no varices.  6. Diastolic CHF: Echo (4/27) with EF 60-65%, bioprosthetic aortic valve with mean gradient 15 mmHg, mild MR.  CPX (9/14): peak VO2 10.4, VE/VCO2 43.8, inferolateral ST depression and ST elevation in V1/V2 with severe dyspnea and chest pain, severe functional limitation with ischemic changes.  Echo (9/14) with EF 60-65%, normal RV size and systolic function, mild-moderate MR, bioprosthetic aortic valve normal, PA systolic pressure 51 mmHg, no evidence for pericardial constriction though exam incomplete for constriction.  Echo (11/15) with EF 55-60%, bioprosthetic aortic valve, mild-moderate MR, moderate TR, PA systolic pressure 42 mmHg.  - TEE (5/17) with EF 60-65%, normal bioprosthetic aortic valve with mean gradient 13 mmHg, normal RV size and systolic function. - Echo (1/18) with EF 60-65%, normal bioprosthetic aortic valve, mild-moderate  MR, PASP 35 mmHg.  - Echo (10/18) with EF 55-60%, bioprosthetic aortic valve mean gradient 10 mmHg, MAC with mild mitral stenosis (mean gradient 7) and mild mitral regurgitation, moderate to severe TR, PASP 43 mmHg.  - RHC (9/18): mean RA 13, PA 44/18 mean 33, mean PCWP 19, CI 3.45, PVR 2.7 WU.  7. TAH-BSO 8. Type I diabetes: Has insulin pump.  9. H/o ischemic colitis. 10. H/o PUD. 11. Diabetic gastroparesis. 12. Hyperlipidemia: Myalgias with Crestor and atorvastatin, elevated LFTs with simvastatin.  Myalgias with > 10 mg pravastatin. Now on Praluent.  13. CKD stage IV: Sees Dr. Lorrene Reid 14. Bicuspid aortic valve s/p 19 mm Edwards pericardial valve in 1/11 (had post-operative Dresslers syndrome).   15. CAD: CABG with SVG-RCA in 12/05.  Redo SVG-RCA in 1/11 with AVR.  LHC/RHC (9/14) with mean RA 18, PA 62/25 mean 43, mean PCWP 26, CI 2.8, 70-80% distal LM stenosis, 80% ostial LAD stenosis, total occlusion  RCA, SVG-PDA patent.  Patient had DES LM into LAD.  Plan to continue long-term ASA/Brilinta (poor responder to Plavix).  ETT-cardiolite (4/15) with 6' exercise, EF 60%, ST depression in recovery and mild chest pain, no ischemia or infarction by perfusion images.  Unstable angina 11/15 with 80% LM, patent SVG-RCA => DES to ostial LM.   - Lexiscan Cardiolite (2/18): EF 62%, no ischemia or infarction (normal).  She had a significant reaction to [place] and probably should not get again.  - Cardiolite (9/18): EF 72%, prior inferior infarct with mild peri-infarct ischemia.  - LHC (9/18): 2 layers stent left main - ostial LAD with 50% dLM in-stent restenosis, 75+% ostial LCx, totally occluded RCA, SVG-RCA patent.  Medical management planned.  - ETT-Cardiolite (4/19): EF normal, 7'11" exercise, no ischemia or infarction.  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  17. Anemia: Iron-deficiency.  Suspect chronic GI blood loss.  EGD in 9/14 was unremarkable.  Capsule  endoscopy 10/14 was unremarkable.  Has required periodic blood transfusions.  18. Atrial fibrillation: Paroxysmal, noted by [facility] interrogation.  19. Carotid stenosis: Carotid dopplers (2/15) with 40-59% bilateral stenosis. Carotid dopplers (3/16) with 40-59% bilateral ICA stenosis. Carotid dopplers (3/17) with 40-59% bilateral ICA stenosis.  - Carotid dopplers (3/18) with mild BICA stenosis.  20. 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  22. C difficile colitis 2017 23. Gout 24. ?TIA: head CT negative.  25. OSA: Moderate, uses CPAP.  26. Chronic thrombocytopenia  SH: Married, lives in [place], nonsmoker. Former school principal.  FH: CAD  ROS: All systems reviewed and negative except as per HPI.   Current Outpatient Medications  Medication Sig Dispense Refill  . Alirocumab (PRALUENT) 150 MG/ML SOPN Inject 1 pen into the skin every 14 (fourteen) days. 2 pen 11  . ALPRAZolam (XANAX) 0.5 MG tablet Take 1 tablet (0.5 mg total) by mouth 2 (two) times daily as needed for anxiety. (Patient taking differently: Take 0.5 mg by mouth 2 (two) times daily as needed for anxiety. Taken one at night) 60 tablet 1  . amLODipine (NORVASC) 2.5 MG tablet Take 1 tablet (2.5 mg total) by mouth daily. 90 tablet 1  . APIDRA 100 UNIT/ML injection Inject 46 Units into the skin as directed. INFUSE THROUGH INSULIN PUMP UTD::[place] 46.15 units daily, Bolus depends on carb intake  2  . aspirin EC 81 MG tablet Take 81 mg by mouth at bedtime.    . Cholecalciferol 2000 UNITS TABS Take 1 tablet by mouth every morning.     . Coenzyme Q10 (CO Q 10) 100 MG CAPS Take 1 capsule by mouth at bedtime.     . colchicine 0.6 MG tablet Take 0.6 mg by mouth daily as needed (gout flare).    Marland Kitchen docusate sodium (COLACE) 100 MG capsule Take 100 mg by mouth every other day.     . famotidine (PEPCID) 40 MG tablet Take 40  mg by mouth 2 (two) times daily.     . Fe Fum-FA-B Cmp-C-Zn-Mg-Mn-Cu (HEMATINIC PLUS VIT/MINERALS) 106-1 MG TABS TAKE 1 TABLET BY MOUTH THREE TIMES A WEEK(MONDAY, WEDNESDAY, AND FRIDAY) OR AS DIRECTED WITH A VITAMIN "C" 90 tablet 0  . glucagon (GLUCAGON EMERGENCY) 1 MG injection Inject 1 mg into the vein once as needed (for severe reaction). Reported on 12/05/2015    . Icosapent Ethyl (VASCEPA) 1 g CAPS Take 2 capsules (2 g  total) by mouth 2 (two) times daily. 360 capsule 3  . isosorbide mononitrate (IMDUR) 120 MG 24 hr tablet Take 1 tablet (120 mg total) by mouth daily. 90 tablet 3  . latanoprost (XALATAN) 0.005 % ophthalmic solution Place 1 drop into both eyes at bedtime.    . Magnesium Oxide 250 MG TABS Take 400 mg by mouth at bedtime.     . metoprolol tartrate (LOPRESSOR) 50 MG tablet TAKE 1 AND 1/2 TABLETS(75 MG) BY MOUTH TWICE DAILY 270 tablet 2  . Multiple Vitamins-Minerals (MULTIVITAMIN WITH MINERALS) tablet Take 1 tablet by mouth every morning.     . mupirocin cream (BACTROBAN) 2 % Apply 1 application topically 2 (two) times daily as needed. 90 g 0  . nitroGLYCERIN (NITROLINGUAL) 0.4 MG/SPRAY spray PLACE 1 SPRAY UNDER THE TONGUE EVERY 5 MINUTES FOR 3 DOSES AS NEEDED FOR CHEST PAIN 14.7 g 0  . Polyethyl Glycol-Propyl Glycol (SYSTANE) 0.4-0.3 % SOLN Apply 1 drop to eye daily as needed (dry eyes).    . pravastatin (PRAVACHOL) 10 MG tablet Take 1 tablet (10 mg total) by mouth daily. 90 tablet 3  . spironolactone (ALDACTONE) 25 MG tablet Take 0.5 tablets (12.5 mg total) by mouth daily. 90 tablet 1  . ticagrelor (BRILINTA) 60 MG TABS tablet TAKE 1 TABLET(60 MG) BY MOUTH TWICE DAILY 180 tablet 2  . torsemide (DEMADEX) 20 MG tablet Take 40 mg by mouth daily. Take extra if needed for fluid    . triamcinolone cream (KENALOG) 0.5 % Apply 1 application topically daily as needed (inflamation).     Marland Kitchen ULORIC 40 MG tablet Take 1 tablet (40 mg total) by mouth at bedtime. 90 tablet 3  . UNABLE TO FIND Med  Name: So Clean CPAP cleaning system 1 each 0  . clobetasol cream (TEMOVATE) 2.70 % Apply 1 application topically 2 (two) times daily as needed.     . fluticasone (FLONASE) 50 MCG/ACT nasal spray Place 2 sprays into both nostrils daily. (Patient not taking: Reported on 07/09/2018) 48 g 3  . metolazone (ZAROXOLYN) 2.5 MG tablet Take 2.5 mg (1 tab) once as directed by CHF clinic. Call before taking [phone] (Patient not taking: Reported on 07/09/2018) 3 tablet 0   No current facility-administered medications for this encounter.    Filed Weights   07/09/18 1111  Weight: 52.3 kg (115 lb 3.2 oz)    BP (!) 118/52   Pulse 71   Wt 52.3 kg (115 lb 3.2 oz)   SpO2 99%   BMI 20.41 kg/m  General: NAD Neck: No JVD, no thyromegaly or thyroid nodule.  Lungs: Clear to auscultation bilaterally with normal respiratory effort. CV: Nondisplaced PMI.  Heart regular S1/S2, no S3/S4, 2/6 early SEM RUSB.  No peripheral edema.  No carotid bruit.  Normal pedal pulses.  Abdomen: Soft, nontender, no hepatosplenomegaly, no distention.  Skin: Intact without lesions or rashes.  Neurologic: Alert and oriented x 3.  Psych: Normal affect. Extremities: No clubbing or cyanosis.  HEENT: Normal.   Assessment/Plan:  1. CAD: s/p SVG-RCA in 12/05, then redo SVG-RCA in 1/11--> distal left main/proximal LAD severe stenosis in 9/14.  PCI with DES to distal LM/proximal LAD was done rather than bypass as she would have been a poor candidate for redo sternotomy (2 prior sternotomies and history of chest wall radiation for Hodgkins disease).  Recurrent unstable angina with 80% ostial LM treated with repeat DES in 11/15.  Lexiscan Cardiolite in 9/18 inferior changes.  Repeat LHC (9/18) showed 50%  distal left main in-stent restenosis and 75+% ostial LCx stenosis. I reviewed the films at the time with interventional cardiology => would be very difficult to intervene on the ostial LCx through 2 overlapping stents from left main into  the LAD.  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  Given no ACS, we planned medical management and increased imdur.  Cardiolite in 3/19 showed no ischemia or infarction.  Rare atypical chest pain.  - Continue Brilinta 60 mg bid (poor responder to plavix) long-term given recurrent left main disease. She has recent recurrent rectal bleeding thought to be diverticular.  This has resolved.  - Continue ASA 81, statin, metoprolol, and Imdur 120 mg daily.    - She was unable to tolerate ranolazine.  2. Bioprosthetic aortic valve replacement: In setting of bicuspid aortic valve, thoracic aorta not dilated on 2017 TEE.  Valve looked ok on 10/18 echo.   3. Chronic diastolic CHF: Most recent echo in 10/18 showed normal LV systolic function but also showed moderate to severe TR.  RHC was concerning for right > left heart failure. She is doing better now.  She is not volume overloaded on exam, NYHA class II-III symptoms. She is not taking metolazone.  - Continue torsemide 40 mg daily.   - With elevated creatinine, will decrease spironolactone to 12.5 mg daily.  - I will arrange for repeat echo.   4. CKD:  Patient has a single kidney s/p right nephrectomy.  CKD stage 4, slowly progressive.  5. Hyperlipidemia: She has not been able to tolerate any higher potency statin than pravastatin 10 mg daily.  Good lipids in 9/19.  - Continue pravastatin and Praluent.  - She is on Vascepa for elevated TGs.   6. Anemia: Chronic/slow GI bleed.  Unremarkable EGD and capsule endoscopy in fall 2014.  Unfortunately, needs to stay on Brilinta long-term (Plavix non-responder).  Recent rectal bleeding thought to be diverticular, has now resolved.  Has followup with GI.  7. Atrial fibrillation: Paroxysmal, brief episodes noted on PCM interrogation. Given GI bleeding and need to be on Brilinta, risks have been thought to outweigh benefits of anticoagulation.  She is not a good Multaq candidate with CHF.   If she needs an antiarrhythmic, consider Tikosyn or Sotalol but renal function may make these meds difficult to use.  NSR today.  - We were unable to place Watchman device due to nickel allergy.   8. Carotid bruit: Only mild stenosis on last dopplers in 3/18.  9. SSS: Pacemaker in place.  10. HTN: controlled. Continue current regimen.   11. OSA: Moderate, using CPAP.  12. ?TIA: Transient dysarthria in 3/18, could have been afib-related TIA.  No recurrent symptoms.  13. Thrombocytopenia: Chronic and mild. Follows with hematology. .   Followup 3 months.   [name] 07/10/2018

## 2018-07-14 ENCOUNTER — Other Ambulatory Visit: Payer: Self-pay | Admitting: Family Medicine

## 2018-07-14 ENCOUNTER — Other Ambulatory Visit: Payer: Self-pay | Admitting: Internal Medicine

## 2018-07-17 ENCOUNTER — Other Ambulatory Visit: Payer: Self-pay | Admitting: Family Medicine

## 2018-07-21 ENCOUNTER — Other Ambulatory Visit (HOSPITAL_COMMUNITY): Payer: Self-pay | Admitting: Cardiology

## 2018-07-21 DIAGNOSIS — I6523 Occlusion and stenosis of bilateral carotid arteries: Secondary | ICD-10-CM

## 2018-07-23 LAB — CUP PACEART REMOTE DEVICE CHECK
Battery Impedance: 282 Ohm
Battery Remaining Longevity: 134 mo
Battery Voltage: 2.8 V
Brady Statistic AP VP Percent: 0 %
Brady Statistic AP VS Percent: 0 %
Brady Statistic AS VP Percent: 0 %
Brady Statistic AS VS Percent: 100 %
Date Time Interrogation Session: 20191101201039
Implantable Lead Implant Date: 20140130
Implantable Lead Location: 753859
Implantable Lead Location: 753860
Implantable Lead Model: 5076
Implantable Lead Model: 5076
Implantable Pulse Generator Implant Date: 20140130
Lead Channel Impedance Value: 441 Ohm
Lead Channel Impedance Value: 588 Ohm
Lead Channel Pacing Threshold Amplitude: 0.375 V
Lead Channel Pacing Threshold Amplitude: 0.625 V
Lead Channel Pacing Threshold Pulse Width: 0.4 ms
Lead Channel Pacing Threshold Pulse Width: 0.4 ms
Lead Channel Setting Pacing Amplitude: 1.5 V
Lead Channel Setting Pacing Amplitude: 2.5 V
Lead Channel Setting Sensing Sensitivity: 5.6 mV
MDC IDC LEAD IMPLANT DT: 20140130
MDC IDC SET LEADCHNL RV PACING PULSEWIDTH: 0.4 ms

## 2018-07-26 ENCOUNTER — Encounter (HOSPITAL_COMMUNITY): Payer: Self-pay | Admitting: Emergency Medicine

## 2018-07-26 ENCOUNTER — Ambulatory Visit (HOSPITAL_COMMUNITY)
Admission: EM | Admit: 2018-07-26 | Discharge: 2018-07-26 | Disposition: A | Discharge status: Home / Self Care | Payer: Medicare Other | Attending: Family Medicine | Admitting: Family Medicine

## 2018-07-26 DIAGNOSIS — T148XXA Other injury of unspecified body region, initial encounter: Secondary | ICD-10-CM | POA: Insufficient documentation

## 2018-07-26 MED ORDER — SILVER NITRATE-POT NITRATE 75-25 % EX MISC: CUTANEOUS | 0 refills | Status: DC

## 2018-07-26 NOTE — ED Triage Notes (Signed)
Pt states around 2pm she changed her insulin pump and the needle site would not stop bleeding, states shes had this issue before and has used silver nitrate to stop the bleeding.

## 2018-07-28 NOTE — ED Provider Notes (Signed)
[name] [name]   [id number] 07/26/18 Arrival Time: 1761  ASSESSMENT & PLAN:  1. Bleeding from wound    Oozing wound cauterized using silver nitrate. No complications. Observed for 10 minutes without recurrence of bleeding. Bandaged.  Meds ordered this encounter  Medications  . silver nitrate applicators 60-73 % applicator    Sig: Use as needed to stop superficial skin bleeding.    Dispense:  10 each    Refill:  0   She may attempt cauterization with silver nitrate in the future but agrees to seek care if unable to get bleeding to stop after single application.  Will f/u with her PCP and return here if needed.  Reviewed expectations re: course of current medical issues. Questions answered. Outlined signs and symptoms indicating need for more acute intervention. Patient verbalized understanding. After Visit Summary given.   SUBJECTIVE:  [name] [name] is a 68 y.o. female who presents here with reported bleeding from site on abdomen where she changed her insulin pump today. On Brilinta and reports this has happened before. Has been seen in ED a few times for the same. Tried applying pressure bandage at home with the gauze eventually becoming saturated. No other concerns this evening.  ROS: As per HPI.   OBJECTIVE:  Vitals:   07/26/18 1814 07/26/18 1815  BP:  (!) 116/52  Pulse: 85 83  Resp: 18 18  Temp: 97.9 F (36.6 C) 98.3 F (36.8 C)  SpO2: 100% 100%    General appearance: alert; no distress Skin: lower left abdomen with punctate wound oozing bright red blood; no surrounding erythema; non-tender Abd: soft Psychological: alert and cooperative; normal mood and affect   Allergies  Allergen Reactions  . Avandia [Rosiglitazone Maleate] Other (See Comments)    Congestive heart failure  . Cephalexin Anaphylaxis, Swelling and Rash    Extremities swell , throat swelling   . Clindamycin Hcl Anaphylaxis and Shortness Of Breath  . Lincomycin Anaphylaxis and  Shortness Of Breath  . Metformin Other (See Comments)    ? Congestive heart failure ? Congestive heart failure  . Novolog [Insulin Aspart] Hives and Itching    Humalog & Novolog big knots and whelp    . Penicillins Anaphylaxis, Hives, Swelling and Rash    Has patient had a PCN reaction causing immediate rash, facial/tongue/throat swelling, SOB or lightheadedness with hypotension: Yes Has patient had a PCN reaction causing severe rash involving mucus membranes or skin necrosis: No Has patient had a PCN reaction that required hospitalization No Has patient had a PCN reaction occurring within the last 10 years: No If all of the above answers are "NO", then may proceed with Cephalosporin use.   Marland Kitchen Rosiglitazone Other (See Comments)    Congestive heart failure   Congestive heart failure   . Tizanidine Other (See Comments)    Dizziness, Mental Status Changes, Hallucination    . Adhesive [Tape] Other (See Comments)    Tears skin, Please use "paper" tape  . Atorvastatin Other (See Comments)    increased LFT's, no muscle weakness, leg pain Muscle and joint pain increased LFT's  Increased LFTs  . Cholestyramine Other (See Comments)    Liver Disorder Elevated LFTs  Elevated LFT's   . Gentamycin [Gentamicin] Hives, Itching and Rash    Fine red bumps.  Reaction noted post loop recorder implant, where gentamycin was used for irrigation. Topical prep  . Humalog [Insulin Lispro] Hives, Itching and Other (See Comments)    Pt uses Apidra   .  Iodinated Diagnostic Agents Other (See Comments)    PAIN DURING LYMPHANGIOGRAM '81, NO ALLERGY TO IV CONTRAST. PAIN DURING LYMPHANGIOGRAM '86, NO ALLERGY TO IV CONTRAST PAIN DURING LYMPHANGIOGRAM '81, NO ALLERGY TO IV CONTRAST.Has had procedures with Iodine since without problems. PAIN DURING LYMPHANGIOGRAM '86, NO ALLERGY TO IV CONTRAST  . Limonene Hives, Itching, Rash and Other (See Comments)    Reaction noted post loop recorder implant, where  gentamycin was used for irrigation. Topical prep  . Pravastatin Other (See Comments)    In high doses (20-2m) causes muscle and joint pain  Per pt. "can only take in low doses".   . Prednisone Itching and Other (See Comments)    B/P went high, itching all over   . Ranexa [Ranolazine] Other (See Comments)    Nausea, dizziness, low blood sugar, tingling in right hand, "blood blisters" on tongue   . Rosuvastatin Nausea And Vomiting, Nausea Only and Other (See Comments)    Muscle and joint pain & increased LFTs; tolerates Pravastatin 139m(max)  . Simvastatin Other (See Comments)    Increased LFT's Increased LFTs  . Strawberry Extract Hives, Itching, Rash and Other (See Comments)    Reaction noted post loop recorder implant, where gentamycin was used for irrigation. Topical prep  . Clopidogrel Other (See Comments)    Does not work for patient Medicine was not effective  . Doxycycline     Blisters in mouth  . Plavix [Clopidogrel Bisulfate] Other (See Comments)    P2Y12 testing = 271 while on Plavix  . Codeine Nausea And Vomiting  . Erythromycin Nausea And Vomiting  . Hydrocodone-Acetaminophen Itching, Rash and Other (See Comments)    itching  . Latex Itching and Other (See Comments)    I"if wear gloves hands get red ,itchy no prioblem if other wear and touch me"  . Neomycin Rash and Other (See Comments)    Redness  . Nickel Itching  . Ranitidine Nausea Only  . Tamiflu [Oseltamivir] Nausea And Vomiting  . Tramadol Nausea Only    Past Medical History:  Diagnosis Date  . Anxiety   . Aortic stenosis    a. bicuspid aortic valve; mean gradient of 20 mmHg in 2/10; b. s/p bioprosth AVR (19-mm Edwards pericardial valve) with a redo coronary artery bypass graft procedure in 07/2009; postoperative Dressler's syndrome    . Arthritis   . Breast cancer ([facility]  . Carcinoma, renal cell ([facility]/2008   Laparoscopic right nephrectomy  . Chromophilic renal cell carcinoma ([facility]  . Chronic  diastolic CHF (congestive heart failure) ([facility]   a. 9.2014 EchoP EF 60-65%, no rwma, bioprosth AVR, mean gradient of 1526m, mild to mod MR, PASP 56m68m  . CKD (chronic kidney disease), stage II   . Colitis, ischemic (HCC)[place]08  . Coronary artery disease    a. initial CABG with SVG-RCA in 12/05. b. redo SVG-RCA and bioprosthetic AVR in 1/11. d. 9/14 - PCI with DES to distal LM/proximal LAD was done rather than bypass as she would have been a poor candidate for redo sternotomy. d. S/p DES to prox LM 05/2014.  . Diabetes mellitus 03/2010    TYPE II: Hemoglobin A1c of 7.4 in  03/2010; 8.4 in 06/2010; treated with insulin pump  . Duodenal ulcer    Remote; H. pylori positive  . Gastroparesis due to DM (HCC)[place]/04/2012  . Genetic testing 01/14/2017   Ms. Faddis underwent genetic counseling and testing for hereditary cancer syndromes on 01/01/2017. Her results were  negative for mutations in all 83 genes analyzed by [facility]'s 83-gene Common Hereditary Cancers Panel. Genes analyzed include: ALK, APC, ATM, AXIN2, BAP1, BARD1, BLM, BMPR1A, BRCA1, BRCA2, BRIP1, CASR, CDC73, CDH1, CDK4, CDKN1B, CDKN1C, CDKN2A, CEBPA, CHEK2, CTNNA1, DICER1, DIS3L2,   . Hodgkin's disease ([place]) 1991   M[place]le radiation therapy  . Hodgkin's disease ([place])   . Hyperlipidemia   . Hyperplastic gastric polyp 06/23/2012  . Hypertension   . Iron deficiency anemia    a. 03/2013 EGD: essentially normal. Possible slow GIB.  . Migraines   . NASH (nonalcoholic steatohepatitis) 1999   -biopsy in 1999  . OSA on CPAP 04/24/2017   Moderate with AHI 22/hr now on CPAP at 18cm H2o  . Osteopenia     hip on DEXA in October 2007.  . Osteoporosis   . Oxygen deficiency   . Pacemaker    six sinus syndrome  . PAF (paroxysmal atrial fibrillation) ([place])    a.  h/o brief episodes noted on ppm interrogation. b. given GI bleeding and need to be on both ASA 81 and Brilinta, risks likely outweigh benefits of anticoagulation.   . Small bowel  obstruction (HCC)    Recurrent; resolved after resection of a lipoma  . Syncope    a. H/o recurrent syncope with pauses on loop recorder s/p Medtronic pacemaker 07/2012.   Social History   Socioeconomic History  . Marital status: Married    Spouse name: Not on file  . Number of children: 2  . Years of education: Not on file  . Highest education level: Not on file  Occupational History  . Occupation: retired    Comment: elementary principal  Social Needs  . Financial resource strain: Not on file  . Food insecurity:    Worry: Not on file    Inability: Not on file  . Transportation needs:    Medical: Not on file    Non-medical: Not on file  Tobacco Use  . Smoking status: Never Smoker  . Smokeless tobacco: Never Used  Substance and Sexual Activity  . Alcohol use: No    Alcohol/week: 0.0 standard drinks  . Drug use: No  . Sexual activity: Yes  Lifestyle  . Physical activity:    Days per week: Not on file    Minutes per session: Not on file  . Stress: Not on file  Relationships  . Social connections:    Talks on phone: Not on file    Gets together: Not on file    Attends religious service: Not on file    Active member of club or organization: Not on file    Attends meetings of clubs or organizations: Not on file    Relationship status: Not on file  Other Topics Concern  . 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 Has a son, Aaron Edelman, who is Retail buyer.   Eats all food groups.   Wear seatbelt.    Attends church. [place], [place]. [employer].          Vanessa Kick, MD 07/28/18 [phone]

## 2018-08-11 ENCOUNTER — Other Ambulatory Visit (HOSPITAL_COMMUNITY): Payer: Self-pay | Admitting: Cardiology

## 2018-08-13 ENCOUNTER — Telehealth (HOSPITAL_COMMUNITY): Payer: Self-pay

## 2018-08-13 NOTE — Telephone Encounter (Signed)
Pt notified Verbalizes understanding 

## 2018-08-13 NOTE — Telephone Encounter (Signed)
Pt.notified

## 2018-08-13 NOTE — Telephone Encounter (Signed)
Pt called to state that she is having a colonoscopy at [facility] and would like to know if she should come off her Brilinta before the procedure and for how long? Please advise.

## 2018-08-13 NOTE — Telephone Encounter (Signed)
Would hold 4 days prior to colonoscopy.

## 2018-08-13 NOTE — Telephone Encounter (Signed)
This would be per GI. It is important for her to continue, but if they need to hold for the day prior it should be OK.  She should follow their direction.  Will forward to Dr. Aundra Dubin as well to make sure he agrees.

## 2018-08-21 ENCOUNTER — Ambulatory Visit (HOSPITAL_COMMUNITY)
Admission: RE | Admit: 2018-08-21 | Discharge: 2018-08-21 | Disposition: A | Discharge status: Home / Self Care | Payer: Medicare Other | Source: Ambulatory Visit | Attending: Family Medicine | Admitting: Family Medicine

## 2018-08-21 DIAGNOSIS — I071 Rheumatic tricuspid insufficiency: Secondary | ICD-10-CM | POA: Diagnosis not present

## 2018-08-21 DIAGNOSIS — Z950 Presence of cardiac pacemaker: Secondary | ICD-10-CM | POA: Diagnosis not present

## 2018-08-21 DIAGNOSIS — I2510 Atherosclerotic heart disease of native coronary artery without angina pectoris: Secondary | ICD-10-CM | POA: Insufficient documentation

## 2018-08-21 DIAGNOSIS — Z951 Presence of aortocoronary bypass graft: Secondary | ICD-10-CM | POA: Diagnosis not present

## 2018-08-21 DIAGNOSIS — I5032 Chronic diastolic (congestive) heart failure: Secondary | ICD-10-CM | POA: Insufficient documentation

## 2018-08-21 DIAGNOSIS — I371 Nonrheumatic pulmonary valve insufficiency: Secondary | ICD-10-CM | POA: Insufficient documentation

## 2018-08-21 DIAGNOSIS — I059 Rheumatic mitral valve disease, unspecified: Secondary | ICD-10-CM | POA: Insufficient documentation

## 2018-08-21 NOTE — Progress Notes (Signed)
2D Echocardiogram has been performed.  08/21/2018, 10:47 AM

## 2018-08-25 ENCOUNTER — Ambulatory Visit (INDEPENDENT_AMBULATORY_CARE_PROVIDER_SITE_OTHER): Payer: Medicare Other

## 2018-08-25 DIAGNOSIS — I495 Sick sinus syndrome: Secondary | ICD-10-CM

## 2018-08-28 LAB — CUP PACEART REMOTE DEVICE CHECK
Battery Impedance: 307 Ohm
Battery Remaining Longevity: 131 mo
Battery Voltage: 2.81 V
Brady Statistic AP VP Percent: 0 %
Brady Statistic AP VS Percent: 0 %
Brady Statistic AS VP Percent: 0 %
Brady Statistic AS VS Percent: 100 %
Date Time Interrogation Session: 20200203154623
Implantable Lead Implant Date: 20140130
Implantable Lead Implant Date: 20140130
Implantable Lead Location: 753859
Implantable Lead Location: 753860
Implantable Lead Model: 5076
Implantable Pulse Generator Implant Date: 20140130
Lead Channel Impedance Value: 605 Ohm
Lead Channel Pacing Threshold Amplitude: 0.375 V
Lead Channel Pacing Threshold Amplitude: 0.625 V
Lead Channel Pacing Threshold Pulse Width: 0.4 ms
Lead Channel Pacing Threshold Pulse Width: 0.4 ms
Lead Channel Setting Pacing Amplitude: 1.5 V
Lead Channel Setting Pacing Amplitude: 2.5 V
Lead Channel Setting Pacing Pulse Width: 0.4 ms
Lead Channel Setting Sensing Sensitivity: 5.6 mV
MDC IDC MSMT LEADCHNL RA IMPEDANCE VALUE: 447 Ohm

## 2018-09-02 NOTE — Progress Notes (Signed)
Remote pacemaker transmission.   

## 2018-09-08 ENCOUNTER — Encounter: Payer: Self-pay | Admitting: Dietician

## 2018-09-08 ENCOUNTER — Encounter: Payer: Medicare Other | Attending: Nephrology | Admitting: Dietician

## 2018-09-08 DIAGNOSIS — K3184 Gastroparesis: Secondary | ICD-10-CM | POA: Insufficient documentation

## 2018-09-08 DIAGNOSIS — I5032 Chronic diastolic (congestive) heart failure: Secondary | ICD-10-CM | POA: Diagnosis present

## 2018-09-08 DIAGNOSIS — N183 Chronic kidney disease, stage 3 unspecified: Secondary | ICD-10-CM

## 2018-09-08 DIAGNOSIS — I25110 Atherosclerotic heart disease of native coronary artery with unstable angina pectoris: Secondary | ICD-10-CM | POA: Insufficient documentation

## 2018-09-08 DIAGNOSIS — E1059 Type 1 diabetes mellitus with other circulatory complications: Secondary | ICD-10-CM | POA: Insufficient documentation

## 2018-09-08 DIAGNOSIS — E1143 Type 2 diabetes mellitus with diabetic autonomic (poly)neuropathy: Secondary | ICD-10-CM | POA: Insufficient documentation

## 2018-09-08 NOTE — Patient Instructions (Addendum)
References:  [place].com  Kidney.org (national kidney foundation)  NutritionFacts.org Continue to stay active Less meat Consider tofu or low sodium plant based protein products. Orgain plant based protein powder in a smoothie on days you are unable to eat or use silken tofu in this. Restrict phosphorous and potassium when you lab values are high. Avoid foods that use Phosphorous as a preservative. Continue a low sodium diet.

## 2018-09-08 NOTE — Progress Notes (Addendum)
Medical Nutrition Therapy:  Appt start time: 1010 end time:  1150.   Assessment:  Primary concerns today: Patient is here alone.  She wants to know how to eat with multiple medical issues.  She states that this is the most depressed that she has been due to the lack of control of her decreased health including worsening kidney function.   History includes CAD (CABG 06/2004 and redo 07/2009- she is no longer a surgical candidate), HD, CKD stage 4, thrombocytopenia, gastroparesis, IBS, ductal breast cancer, Hodgkin lymphoma 1981 (XRT to the chest was thought to have damaged her blood vessels in that area), iron deficiency, kidney cancer with right nephrectomy, OSA on c-pap, DASH (fatty liver), Type 1 Diabetes.(4 years).    Labs 06/18/2018:  BUN 54, Creatinine 1.96, eGFR 26, potassium 4.2, Phosphorous 3.5. She reports A1C between 6.9%-8.1%  She is on the medtronic 630 G insulin pump with a Dexxcom G6 CGM but will be transitioning to another type. Insulin:CHO ratio = 1:7. Medications include Apidra (allergy to humalon and novolog), spirolactone  117 lbs [facility] with recent los of 10 lbs in the past 6 months. She weighing daily on a scale at home from the CHF clinic and is around 114 lbs.  Retired Barrister's clerk principal, married.  She does the shopping and cooking. Started disability September 2013 Has one daughter in [place], works at [employer]. Has a son, Aaron Edelman, who is Retail buyer. Eats all food groups.  Follows a low fat, low car, low salt, low fiber diet. Attends church. [place], [place]. [employer].   Preferred Learning Style:   No preference indicated   Learning Readiness:   Ready  Change in progress   DIETARY INTAKE:  Cooks at home and occasionally eats out. Green beans, brocdoli, potato, sweet potato, spinach only as she does not tolerate vegetables with husks.  Uses canola butter or spray butter and Low sugar jelly (small amounts) Limits red meat as more  than 2 bites causes increased nausea due to gastroparesis. peels and cooks tomatoes, peels apples Avoids: soft drinks  24-hr recall:  B (1:74-9 AM): 1 slice white toast, natural PB, 4 ounces OJ (to mix Benefiber with) OR boiled egg, OJ OR 1 strip LS bacon, white bread, OJ OR small amount of leftovers OR cheerios, milk, berries, OJ Snk ( AM): occasional berries or PB, graham cracker  L ( PM): 2 grilled chicken fingers, vegetables Snk ( PM):  D ( PM): chicken and broccoli casserole OR hamburger and egg noodle casserole OR grilled, baked, or air fried meat, 1/2 potato and sweet potato, vegetables Snk ( PM): occasional Beverages: water (>=10 cups daily), unsweetened tea, OJ, rare lactose free milk  Usual physical activity: 3-6 days per week 20-30 minutes (stationary bike, strength exercises)  Estimated energy needs: 1600-1800 calories 220 g carbohydrates 45-55 g protein 55 g fat  Progress Towards Goal(s):  In progress.   Nutritional Diagnosis:  NB-1.1 Food and nutrition-related knowledge deficit As related to combining diets for CAD, CKD, and DM.  As evidenced by patient report.    Intervention:  Nutrition counseling/education related to diet for CKD, CAD, and diabetes.  Discussed the CKD guidelines (protien from plants is easier for the kidneys compared to animal protein, reviewed low sodium which patient does well with, discussed low phosphorous and low potassium restriction to begin as needed when her labs are high.)  Discussed importance of staying active, Discussed to get more calories from carbohydrates to maintain a healthy weight.  Continue to count carbohydrates.(reviewed counting by portion size to simplify carbohydrate counting).  References for recipes provided.  Discussed plant based eating for CAD but patient states that she thinks this would not help as the damage was caused from radiation.  References:  [place].com  Kidney.org (national kidney  foundation)  NutritionFacts.org Continue to stay active Less meat Consider tofu or low sodium plant based protein products. Orgain plant based protein powder in a smoothie on days you are unable to eat or use silken tofu in this. Restrict phosphorous and potassium when you lab values are high. Avoid foods that use Phosphorous as a preservative. Continue a low sodium diet.   Teaching Method Utilized:  Visual Auditory  Handouts given during visit include:  Meal plan card  CKD stage 1-4 nutrition therapy from [facility]  Kidney diet pyramid  Barriers to learning/adherence to lifestyle change: multiple health problems, poor energy  Demonstrated degree of understanding via:  Teach Back   Monitoring/Evaluation:  Dietary intake, exercise, and body weight prn.

## 2018-09-19 ENCOUNTER — Telehealth: Payer: Self-pay | Admitting: Dietician

## 2018-09-19 ENCOUNTER — Telehealth: Payer: Self-pay

## 2018-09-19 NOTE — Telephone Encounter (Signed)
Patient called and requested a call back to discuss questions about her lab results

## 2018-09-19 NOTE — Telephone Encounter (Signed)
Returned patient call. She had new labs and wanted to discuss them related to her diet. Per her report, phosphorous was elevated.  She states that she has been following a low phosphorous diet prior to the test and that MD was going to wait to add a phos binder.   We discussed phosphorous further related to food. Potassium normal BUN 49, and Creatinine 1.61, GFR 33 improved. She states that she has been eating a few more carbs and has required more insulin.  Discussed that when other foods are limited that carbohydrates provide additional calories and that it is important to eat well and maintain a healthy weight.  Patient to call for any further questions.  Antonieta Iba, RD, LDN, CDE

## 2018-09-22 ENCOUNTER — Telehealth (HOSPITAL_COMMUNITY): Payer: Self-pay

## 2018-09-22 NOTE — Telephone Encounter (Signed)
The prep causes loose stools and bowel movements, so should not have caused her to gain weight.    She is likely re-equilibrating after LOSING weight from her colonoscopy.  Unless she is swollen or short of breath,  she does not need to take any more extra torsemide at this time.  Would follow her weight back on her normal torsemide, and try to aim for her pre-colonoscopy weight.     Legrand Como [address] [place], PA-C 09/22/2018 11:41 AM

## 2018-09-22 NOTE — Telephone Encounter (Signed)
Pt called to report that she has gained 4 lbs since her colonoscopy on 02/17. Pt believes the extra fluid came from prepping for the colonoscopy. Pt stated that she has taken an extra torsemide for the last 3 days and her wt has slowly went up. Pt is currently taking torsemide 20 mg (Take 40 mg by mouth daily. Take extra if needed for fluid). Please advise.

## 2018-09-22 NOTE — Telephone Encounter (Signed)
Pt notified Verbalizes understanding 

## 2018-09-29 ENCOUNTER — Telehealth: Payer: Self-pay | Admitting: *Deleted

## 2018-09-29 DIAGNOSIS — Z9989 Dependence on other enabling machines and devices: Principal | ICD-10-CM

## 2018-09-29 DIAGNOSIS — G4733 Obstructive sleep apnea (adult) (pediatric): Secondary | ICD-10-CM

## 2018-09-29 NOTE — Telephone Encounter (Signed)
More Pressure  [name] [name], CMA  Sueanne Margarita, MD        Patient has been getting clear small phlegm in her throat sometimes at night when she wakes. She is on Auto 5-20 but she would like more pressure and go up to 20 if she needs it. [facility] says the highest it has gotten in the last 30 days is 13.8.  Please advise

## 2018-09-29 NOTE — Addendum Note (Signed)
Addended by: Freada Bergeron on: 09/29/2018 04:53 PM   Modules accepted: Orders

## 2018-09-29 NOTE — Telephone Encounter (Signed)
Order placed to [facility] today.

## 2018-09-29 NOTE — Telephone Encounter (Signed)
RE: More Pressure  Turner, Eber Hong, MD  Freada Bergeron, CMA        Increase CPAP pressure on auto to 6 to 8 cm H2O. Please refer her to ENT to evaluate for issues with phlegm.   Traci TUrner

## 2018-10-09 ENCOUNTER — Encounter (HOSPITAL_COMMUNITY): Payer: Medicare Other | Admitting: Cardiology

## 2018-10-10 ENCOUNTER — Other Ambulatory Visit (HOSPITAL_COMMUNITY): Payer: Self-pay | Admitting: Cardiology

## 2018-10-13 ENCOUNTER — Telehealth (HOSPITAL_COMMUNITY): Payer: Self-pay | Admitting: *Deleted

## 2018-10-13 ENCOUNTER — Telehealth (HOSPITAL_COMMUNITY): Payer: Self-pay | Admitting: Cardiology

## 2018-10-13 NOTE — Telephone Encounter (Signed)
-----   Message from Kara Mead sent at 10/13/2018  2:36 PM EDT ----- Regarding: Question on meds with wt loss Hey,  Please contact patient.  She has recently lost approx 10 lbs and wanted to know if she needed to change the dosage of her Torsemide and Spironolactone, or any of her other meds.  Please call the home number first which is pt's cells but if no answer then try the cell number.  She has to go out to pick up some refills this afternoon.  Thanks!

## 2018-10-13 NOTE — Telephone Encounter (Signed)
Called and spoke with patient to make sure demographics on file were correct.  Pt already has active MyChart account set up.  Pt did have a question about dosage of some of her meds as she has lost 10 lbs recently.  Staff message sent to Triage Pool to contact pt.  Pt states otherwise she is doing well and has no other concerns during the Covid 19 pandemic.

## 2018-10-13 NOTE — Telephone Encounter (Signed)
Spoke with pt and scheduled a telephone visit with Dr.McLean tomorrow at 2pm,

## 2018-10-14 ENCOUNTER — Ambulatory Visit (HOSPITAL_COMMUNITY)
Admission: RE | Admit: 2018-10-14 | Discharge: 2018-10-14 | Disposition: A | Discharge status: Home / Self Care | Payer: Medicare Other | Source: Ambulatory Visit | Attending: Cardiology | Admitting: Cardiology

## 2018-10-14 ENCOUNTER — Other Ambulatory Visit: Payer: Self-pay

## 2018-10-14 DIAGNOSIS — I5032 Chronic diastolic (congestive) heart failure: Secondary | ICD-10-CM

## 2018-10-14 DIAGNOSIS — I25110 Atherosclerotic heart disease of native coronary artery with unstable angina pectoris: Secondary | ICD-10-CM

## 2018-10-14 DIAGNOSIS — I2510 Atherosclerotic heart disease of native coronary artery without angina pectoris: Secondary | ICD-10-CM

## 2018-10-14 DIAGNOSIS — I480 Paroxysmal atrial fibrillation: Secondary | ICD-10-CM | POA: Diagnosis not present

## 2018-10-14 NOTE — Progress Notes (Signed)
Heart Failure TeleHealth Note  Due to national recommendations of social distancing due to [place] 19, Audio telehealth visit is felt to be most appropriate for this patient at this time.  See MyChart message from today for patient consent regarding telehealth for [facility].  Date:  10/14/2018   ID:  [name], [name] Oct 24, 1950, MRN [id number]  Location: Home  Provider location: [address] [place] Type of Visit: Established patient  PCP:  [name], MD  Cardiologist:  No primary care provider on file. Primary HF: Dr. Aundra Dubin  Chief Complaint:  Exertional shortness of breath.   History of Present Illness: [name] [name] is a 68 y.o. female who presents via audio conferencing for a telehealth visit today.      she denies symptoms of cough, fevers, chills, or new SOB worrisome for COVID 59.    68 y.o. with history of CAD s/p CABG in 12/05 and redo in 1/11, bioprosthetic AVR, diastolic CHF, and sick sinus syndrome s/p PCM presents for cardiology followup.  She had initial CABG with SVG-RCA in 12/05 followed by redo SVG-RCA and bioprosthetic AVR in 1/11.  She has had diastolic CHF and is on Lasix.  In 9/14, she had a CPX test.  This showed severe functional limitation with ischemic ECG changes (inferolateral ST depression and ST elevation in V1 and V2). She had chest pain.  She had R/LHC in 9/14.  This showed elevated left and right heart filling pressures, patent SVG-PDA, and 80% distal LM/80% ostial LAD stenosis.  She was not a candidate for redo CABG (had 2 prior sternotomies as well as chest radiation for Hodgkins lymphoma).  She had DES from left main into proximal LAD and will need long-term DAPT => Brilinta was used as she has been a poor responder to Plavix in the past.  She re-developed angina in 11/15 and was admitted with unstable angina.  LHC showed 80% ostial LM and patent SVG-RCA. She had DES to ostial LM. Echo in 1/18 showed EF 60-65%, normal  bioprosthetic aortic valve, mild-moderate MR, PASP 35 mmHg.   She has chronic iron deficiency anemia thought to be due to a slow GI bleed.  Last EGD was unremarkable and a capsule endoscopy was also unremarkable.  She has had periodic transfusions.    She had bilateral mastectomy for DCIS in 19/50 without complication.  She has an insulin pump followed by an endocrinologist at [facility].   Given myalgias with statins, she was started on Praluent.  LDL is now excellent.    In the past she has had short runs of atrial fibrillation.  She is not anticoagulated. Planned for Watchman placement, but given her nickel allergy, we were unable to place a Watchman.     Diagnosed with OSA, now on CPAP.   Given ongoing problems with exertional dyspnea and occasional chest pain, Cardiolite was repeated in 9/18 and showed inferior changes.  She underwent left/right heart cath in 9/18.  This showed elevated filling pressures, R>L.  There was 75+% ostial LCx stenosis.  However, there were 2 layers of stent from the mid left main into the ostial LAD, and LCx intervention was thought to be very risky.  It was decided to manage her medically.  Echo was repeated in 10/18 showing EF 55-60%, moderate to severe TR.    Given recurrent chest pain, she had ETT-Cardiolite done in 3/19.  This was a normal study with no evidence for ischemia or infarction.  Echo  in 1/20 showed EF 55-60%, mild LVH, stable bioprosthetic aortic valve, moderate-severe TR.   Today, we had a telehealth call to discuss CHF, CAD, and CKD.  She has been stable symptomatically over the last few weeks. No chest pain.  She is having more trouble with balance over the last few months but no lightheadedness.  She is able to walk on a treadmill for 45 minutes without dyspnea.  She is short of breath with heavier activity like pulling weeds in her yard.  She is using CPAP. No orthopnea/PND.  She weighs daily, weight has been trending down.    Labs  (2/19): hgb 13.2 Labs @ [facility] (3/19): K 4.7, creatinine 1.4, ANA negative, RF negative Labs (4/19): K 3.9, creatinine 1.52 => 1.63 Labs (6/19): LDL 8, HDL 42, TGs 259 Labs (9/19): K 4.9, creatinine 1.65, hgb 13.4, plts 129, LDL 46,TGs 182 Labs (12/19): K 4.9, creatinine 2.07, hgb 12.4 Labs (3/20): K 4, creatinine 1.8  PMH: 1. Sick sinus syndrome s/p [employer] PCM.  2. HTN 3. H/o SBO 4. Renal cell carcinoma s/p right nephrectomy in 2008.  5. 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  Fibroscan in 11/14 showed advanced fibrosis concerning for cirrhosis. EGD in 11/14 showed no varices.  6. Diastolic CHF: Echo (9/83) with EF 60-65%, bioprosthetic aortic valve with mean gradient 15 mmHg, mild MR.  CPX (9/14): peak VO2 10.4, VE/VCO2 43.8, inferolateral ST depression and ST elevation in V1/V2 with severe dyspnea and chest pain, severe functional limitation with ischemic changes.  Echo (9/14) with EF 60-65%, normal RV size and systolic function, mild-moderate MR, bioprosthetic aortic valve normal, PA systolic pressure 51 mmHg, no evidence for pericardial constriction though exam incomplete for constriction.  Echo (11/15) with EF 55-60%, bioprosthetic aortic valve, mild-moderate MR, moderate TR, PA systolic pressure 42 mmHg.  - TEE (5/17) with EF 60-65%, normal bioprosthetic aortic valve with mean gradient 13 mmHg, normal RV size and systolic function. - Echo (1/18) with EF 60-65%, normal bioprosthetic aortic valve, mild-moderate MR, PASP 35 mmHg.  - Echo (10/18) with EF 55-60%, bioprosthetic aortic valve mean gradient 10 mmHg, MAC with mild mitral stenosis (mean gradient 7) and mild mitral regurgitation, moderate to severe TR, PASP 43 mmHg.  - RHC (9/18): mean RA 13, PA 44/18 mean 33, mean PCWP 19, CI 3.45, PVR 2.7 WU.  - Echo (1/20): EF 55-60%, mild LVH, mild MR, stable bioprosthetic aortic valve, moderate-severe TR.  7. TAH-BSO 8. Type I diabetes: Has insulin pump.  9. H/o ischemic colitis. 10. H/o  PUD. 11. Diabetic gastroparesis. 12. Hyperlipidemia: Myalgias with Crestor and atorvastatin, elevated LFTs with simvastatin.  Myalgias with > 10 mg pravastatin. Now on Praluent.  13. CKD stage IV: Sees Dr. Lorrene Reid 14. Bicuspid aortic valve s/p 19 mm Edwards pericardial valve in 1/11 (had post-operative Dresslers syndrome).   15. CAD: CABG with SVG-RCA in 12/05.  Redo SVG-RCA in 1/11 with AVR.  LHC/RHC (9/14) with mean RA 18, PA 62/25 mean 43, mean PCWP 26, CI 2.8, 70-80% distal LM stenosis, 80% ostial LAD stenosis, total occlusion RCA, SVG-PDA patent.  Patient had DES LM into LAD.  Plan to continue long-term ASA/Brilinta (poor responder to Plavix).  ETT-cardiolite (4/15) with 6' exercise, EF 60%, ST depression in recovery and mild chest pain, no ischemia or infarction by perfusion images.  Unstable angina 11/15 with 80% LM, patent SVG-RCA => DES to ostial LM.   - Lexiscan Cardiolite (2/18): EF 62%, no ischemia or infarction (normal).  She had a significant  reaction to [place] and probably should not get again.  - Cardiolite (9/18): EF 72%, prior inferior infarct with mild peri-infarct ischemia.  - LHC (9/18): 2 layers stent left main - ostial LAD with 50% dLM in-stent restenosis, 75+% ostial LCx, totally occluded RCA, SVG-RCA patent.  Medical management planned.  - ETT-Cardiolite (4/19): EF normal, 7'11" exercise, no ischemia or infarction.  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  17. Anemia: Iron-deficiency.  Suspect chronic GI blood loss.  EGD in 9/14 was unremarkable.  Capsule endoscopy 10/14 was unremarkable.  Has required periodic blood transfusions.  18. Atrial fibrillation: Paroxysmal, noted by [facility] interrogation.  19. Carotid stenosis: Carotid dopplers (2/15) with 40-59% bilateral stenosis. Carotid dopplers (3/16) with 40-59% bilateral ICA stenosis. Carotid dopplers (3/17) with 40-59% bilateral ICA stenosis.  - Carotid dopplers (3/18) with mild BICA  stenosis.  20. 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  22. C difficile colitis 2017 23. Gout 24. ?TIA: head CT negative.  25. OSA: Moderate, uses CPAP.  26. Chronic thrombocytopenia  Current Outpatient Medications  Medication Sig Dispense Refill   ALPRAZolam (XANAX) 0.5 MG tablet Take 1 tablet (0.5 mg total) by mouth 2 (two) times daily as needed for anxiety. (Patient taking differently: Take 0.5 mg by mouth 2 (two) times daily as needed for anxiety. Taken one at night) 60 tablet 1   amLODipine (NORVASC) 2.5 MG tablet TAKE ONE TABLET (2.5MG TOTAL) BY MOUTH DAILY 90 tablet 1   APIDRA 100 UNIT/ML injection Inject 46 Units into the skin as directed. INFUSE THROUGH INSULIN PUMP UTD::[place] 46.15 units daily, Bolus depends on carb intake  2   aspirin EC 81 MG tablet Take 81 mg by mouth at bedtime.     BRILINTA 60 MG TABS tablet TAKE ONE TABLET (60 MG TOTAL) BY MOUTH TWICE DAILY. 180 tablet 2   Cholecalciferol 2000 UNITS TABS Take 1 tablet by mouth every morning.      clobetasol cream (TEMOVATE) 6.43 % Apply 1 application topically 2 (two) times daily as needed.      Coenzyme Q10 (CO Q 10) 100 MG CAPS Take 1 capsule by mouth at bedtime.      colchicine 0.6 MG tablet Take 0.6 mg by mouth daily as needed (gout flare).     docusate sodium (COLACE) 100 MG capsule Take 100 mg by mouth every other day.      famotidine (PEPCID) 40 MG tablet Take 40 mg by mouth 2 (two) times daily.      Fe Fum-FA-B Cmp-C-Zn-Mg-Mn-Cu (HEMATINIC PLUS VIT/MINERALS) 106-1 MG TABS TAKE 1 TABLET BY MOUTH THREE TIMES A WEEK(MONDAY, WEDNESDAY, AND FRIDAY) OR AS DIRECTED WITH A VITAMIN "C" 90 tablet 0   fluticasone (FLONASE) 50 MCG/ACT nasal spray Place 2 sprays into both nostrils daily. 48 g 3   glucagon (GLUCAGON EMERGENCY) 1 MG injection Inject 1 mg into the vein once as needed (for severe  reaction). Reported on 12/05/2015     Icosapent Ethyl (VASCEPA) 1 g CAPS Take 2 capsules (2 g total) by mouth 2 (two) times daily. 360 capsule 3   isosorbide mononitrate (IMDUR) 120 MG 24 hr tablet Take 1 tablet (120 mg total) by mouth daily. 90 tablet 3   latanoprost (XALATAN) 0.005 % ophthalmic solution Place 1 drop into both eyes at bedtime.     Magnesium Oxide 250 MG TABS Take 400 mg by mouth at  bedtime.      metolazone (ZAROXOLYN) 2.5 MG tablet Take 2.5 mg (1 tab) once as directed by CHF clinic. Call before taking [phone] 3 tablet 0   metoprolol tartrate (LOPRESSOR) 50 MG tablet TAKE ONE AND ONE-HALF TABLETS (75 MG) BYMOUTH TWICE DAILY. 270 tablet 2   Multiple Vitamins-Minerals (MULTIVITAMIN WITH MINERALS) tablet Take 1 tablet by mouth every morning.      mupirocin cream (BACTROBAN) 2 % Apply 1 application topically 2 (two) times daily as needed. 90 g 0   nitroGLYCERIN (NITROLINGUAL) 0.4 MG/SPRAY spray PLACE 1 SPRAY UNDER THE TONGUE EVERY 5 MINUTES FOR 3 DOSES AS NEEDED FOR CHEST PAIN 14.7 g 0   Polyethyl Glycol-Propyl Glycol (SYSTANE) 0.4-0.3 % SOLN Apply 1 drop to eye daily as needed (dry eyes).     PRALUENT 150 MG/ML SOAJ INJECT 150MG SUBCUTANEOUSLY EVERY 2 WEEKS 2 mL 6   pravastatin (PRAVACHOL) 10 MG tablet Take 1 tablet (10 mg total) by mouth daily. 90 tablet 3   silver nitrate applicators 32-67 % applicator Use as needed to stop superficial skin bleeding. 10 each 0   spironolactone (ALDACTONE) 25 MG tablet Take 0.5 tablets (12.5 mg total) by mouth daily. 90 tablet 1   torsemide (DEMADEX) 20 MG tablet Take 40 mg by mouth daily. Take extra if needed for fluid     triamcinolone cream (KENALOG) 0.5 % Apply 1 application topically daily as needed (inflamation).      ULORIC 40 MG tablet Take 1 tablet (40 mg total) by mouth at bedtime. 90 tablet 3   UNABLE TO FIND Med Name: So Clean CPAP cleaning system 1 each 0   No current facility-administered medications for this  encounter.     Allergies:   Avandia [rosiglitazone maleate]; Cephalexin; Clindamycin hcl; Lincomycin; Metformin; Novolog [insulin aspart]; Penicillins; Rosiglitazone; Tizanidine; Adhesive [tape]; Atorvastatin; Cholestyramine; Gentamycin [gentamicin]; Humalog [insulin lispro]; Iodinated diagnostic agents; Limonene; Pravastatin; Prednisone; Ranexa [ranolazine]; Rosuvastatin; Simvastatin; Strawberry extract; Clopidogrel; Doxycycline; Plavix [clopidogrel bisulfate]; Codeine; Erythromycin; Hydrocodone-acetaminophen; Latex; Neomycin; Nickel; Ranitidine; Tamiflu [oseltamivir]; and Tramadol   Social History:  The patient  reports that she has never smoked. She has never used smokeless tobacco. She reports that she does not drink alcohol or use drugs.   Family History:  The patient's family history includes Breast cancer (age of onset: 69) in her sister; Cancer (age of onset: 69) in her sister; Cancer (age of onset: 25) in her maternal grandfather; Diabetes in her father; Glaucoma in her brother; Heart attack in her father, maternal grandmother, and mother; Heart disease in her father and mother; Hypertension in her brother; Lung cancer in her sister.   ROS:  Please see the history of present illness.   All other systems are personally reviewed and negative.   Exam:  [facility] Call; Exam is subjective) General:  No resp difficulty. Lungs: Normal respiratory effort with conversation.  Extremities: Pt denies edema. Neuro: Alert & oriented x 3.   Recent Labs: 01/03/2018: Magnesium 2.7 06/24/2018: ALT 25; BUN 56; Creatinine, Ser 2.07; Hemoglobin 12.4; Platelets 129; Potassium 4.9; Sodium 137  Personally reviewed   Wt Readings from Last 3 Encounters:  09/08/18 53.1 kg (117 lb)  07/09/18 52.3 kg (115 lb 3.2 oz)  06/27/18 52.8 kg (116 lb 8 oz)      ASSESSMENT AND PLAN:  1. CAD: s/p SVG-RCA in 12/05, then redo SVG-RCA in 1/11-->distal left main/proximal LAD severe stenosis in 9/14. PCI with DES to  distal LM/proximal LAD was done rather than bypass as she  would have been a poor candidate for redo sternotomy (2 prior sternotomies and history of chest wall radiation for Hodgkins disease). Recurrent unstable angina with 80% ostial LM treated with repeat DES in 11/15.  Lexiscan Cardiolite in 9/18 inferior changes.  Repeat LHC (9/18) showed 50% distal left main in-stent restenosis and 75+% ostial LCx stenosis. I reviewed the films at the time with interventional cardiology => would be very difficult to intervene on the ostial LCx through 2 overlapping stents from left main into the LAD.  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  Given no ACS, we planned medical management and increased imdur.  Cardiolite in 3/19 showed no ischemia or infarction.  No recent chest pain.  - Continue Brilinta 60 mg bid (poor responder to plavix) long-term given recurrent left main disease. She has recent recurrent rectal bleeding thought to be diverticular.  This has resolved.  - Continue ASA 81, statin, metoprolol, and Imdur 120 mg daily.  - She was unable to tolerate ranolazine.  2. Bioprosthetic aortic valve replacement: In setting of bicuspid aortic valve, thoracic aorta not dilated on 2017 TEE. Valve looked ok on 1/20 echo.  3. Chronic diastolic CHF: Most recent echo in 1/20 showed normal LV systolic function but also showed moderate to severe TR.  Last RHC was concerning for right > left heart failure. She is stable symptomatically, NYHA class II.  Weight is trending down at home. I suspect that she is not significantly volume overloaded.  - Continue torsemide 40 mg daily.   - Most recent K and creatinine in 3/20 were lower, continue spironolactone 12.5 mg daily.  4. CKD: Patient has a single kidney s/p right nephrectomy and suspected diabetic nephropathy. CKD stage 4, slowly progressive.  Creatinine this month was lower at 1.8.  5. Hyperlipidemia: She has not been able to tolerate  any higher potency statin than pravastatin 10 mg daily. Good lipids in 9/19.  - Continue pravastatin and Praluent.  - She is on Vascepa for elevated TGs.   6. Anemia: Chronic/slow GI bleed. Unremarkable EGD and capsule endoscopy in fall 2014. Unfortunately, needs to stay on Brilinta long-term (Plavix non-responder). Recent rectal bleeding thought to be diverticular, has now resolved.  Has followup with GI.  7. Atrial fibrillation: Paroxysmal, brief episodes noted on PCM interrogation. Given GI bleeding and need to be on Brilinta, risks have been thought to outweigh benefits of anticoagulation.  She is not a good Multaq candidate with CHF. If she needs an antiarrhythmic, consider Tikosyn or Sotalol but renal function may make these meds difficult to use.  No recent palpitations.  - We were unable to place Watchman device due to nickel allergy.  8. Carotid bruit: Only mild stenosis on last dopplers in 3/18.  9. SSS: Pacemaker in place.  10. HTN: controlled. Continue current regimen.  11. OSA: Moderate, using CPAP.  12. ?TIA: Transient dysarthria in 3/18, could have been afib-related TIA. No recurrent symptoms.  13. Thrombocytopenia: Chronic and mild. Follows with hematology. Marland Kitchen   COVID screen The patient does not have any symptoms that suggest any further testing/ screening at this time.  Social distancing reinforced today.  Recommended follow-up:  5/20  Relevant cardiac medications were reviewed at length with the patient today.   The patient does not have concerns regarding their medications at this time.   Patient Risk: After full review of this patients clinical status, I feel that they are at moderate risk for cardiac decompensation at this  time.  Today, I have spent 21 minutes with the patient with telehealth technology discussing CHF, CAD, and CKD.    Signed, [name], MD  10/14/2018  [facility] [address] [facility] and [place] [address] [phone] (office) [phone] (fax)

## 2018-10-15 ENCOUNTER — Other Ambulatory Visit (HOSPITAL_COMMUNITY): Payer: Medicare Other

## 2018-11-20 ENCOUNTER — Telehealth (HOSPITAL_COMMUNITY): Payer: Self-pay

## 2018-11-20 NOTE — Telephone Encounter (Signed)
BMET and CBC

## 2018-11-20 NOTE — Telephone Encounter (Signed)
Pt called [name] [name] on VM regarding labs needed for next visit. Pt reports she is going to her Nephrologist appt on May 5th and they will be drawing labs.  She states they will draw additional labs if we specify which ones needed for her May 12th telehealth visit. Please advise which labs you would like for her nephrologist to draw.

## 2018-11-21 ENCOUNTER — Encounter (HOSPITAL_COMMUNITY): Payer: Medicare Other | Admitting: Cardiology

## 2018-11-21 NOTE — Telephone Encounter (Signed)
Pt made aware of labs required.  Pt verbalized understanding.

## 2018-11-28 ENCOUNTER — Encounter: Payer: Medicare Other | Admitting: Internal Medicine

## 2018-12-01 ENCOUNTER — Other Ambulatory Visit: Payer: Self-pay

## 2018-12-01 ENCOUNTER — Ambulatory Visit (INDEPENDENT_AMBULATORY_CARE_PROVIDER_SITE_OTHER): Payer: Medicare Other | Admitting: *Deleted

## 2018-12-01 DIAGNOSIS — I5032 Chronic diastolic (congestive) heart failure: Secondary | ICD-10-CM

## 2018-12-01 DIAGNOSIS — I480 Paroxysmal atrial fibrillation: Secondary | ICD-10-CM

## 2018-12-02 ENCOUNTER — Other Ambulatory Visit: Payer: Self-pay

## 2018-12-02 ENCOUNTER — Ambulatory Visit (HOSPITAL_COMMUNITY)
Admission: RE | Admit: 2018-12-02 | Discharge: 2018-12-02 | Disposition: A | Discharge status: Home / Self Care | Payer: Medicare Other | Source: Ambulatory Visit | Attending: Adult Health | Admitting: Adult Health

## 2018-12-02 ENCOUNTER — Other Ambulatory Visit (HOSPITAL_COMMUNITY): Payer: Self-pay

## 2018-12-02 VITALS — BP 119/65 | Wt 107.6 lb

## 2018-12-02 DIAGNOSIS — I480 Paroxysmal atrial fibrillation: Secondary | ICD-10-CM

## 2018-12-02 DIAGNOSIS — I5032 Chronic diastolic (congestive) heart failure: Secondary | ICD-10-CM

## 2018-12-02 DIAGNOSIS — N184 Chronic kidney disease, stage 4 (severe): Secondary | ICD-10-CM

## 2018-12-02 DIAGNOSIS — E785 Hyperlipidemia, unspecified: Secondary | ICD-10-CM

## 2018-12-02 DIAGNOSIS — I2510 Atherosclerotic heart disease of native coronary artery without angina pectoris: Secondary | ICD-10-CM

## 2018-12-02 DIAGNOSIS — G4733 Obstructive sleep apnea (adult) (pediatric): Secondary | ICD-10-CM

## 2018-12-02 LAB — CUP PACEART REMOTE DEVICE CHECK
Battery Impedance: 307 Ohm
Battery Remaining Longevity: 130 mo
Battery Voltage: 2.8 V
Brady Statistic AP VP Percent: 0 %
Brady Statistic AP VS Percent: 0 %
Brady Statistic AS VP Percent: 0 %
Brady Statistic AS VS Percent: 99 %
Date Time Interrogation Session: 20200511161316
Implantable Lead Implant Date: 20140130
Implantable Lead Implant Date: 20140130
Implantable Lead Location: 753859
Implantable Lead Location: 753860
Implantable Lead Model: 5076
Implantable Lead Model: 5076
Implantable Pulse Generator Implant Date: 20140130
Lead Channel Impedance Value: 453 Ohm
Lead Channel Impedance Value: 576 Ohm
Lead Channel Pacing Threshold Amplitude: 0.375 V
Lead Channel Pacing Threshold Amplitude: 0.625 V
Lead Channel Pacing Threshold Pulse Width: 0.4 ms
Lead Channel Pacing Threshold Pulse Width: 0.4 ms
Lead Channel Setting Pacing Amplitude: 1.5 V
Lead Channel Setting Pacing Amplitude: 2.5 V
Lead Channel Setting Pacing Pulse Width: 0.46 ms
Lead Channel Setting Sensing Sensitivity: 5.6 mV

## 2018-12-02 MED ORDER — ALIROCUMAB 150 MG/ML ~~LOC~~ SOAJ: 150.0000 mg | SUBCUTANEOUS | 6 refills | Status: DC

## 2018-12-02 NOTE — Progress Notes (Signed)
Routed refill to dr hilty. avs sent via mychart.

## 2018-12-02 NOTE — Progress Notes (Signed)
Heart Failure TeleHealth Note  Due to national recommendations of social distancing due to [place] 19, telehealth visit is felt to be most appropriate for this patient at this time.  I discussed the limitations, risks, security and privacy concerns of performing an evaluation and management service by telephone and the availability of in person appointments. I also discussed with the patient that there may be a patient responsible charge related to this service. The patient expressed understanding and agreed to proceed.   ID:  [name] [name], [name] [name] [birth year], MRN [id number]  Location: Home  Provider location: [address], [place] Type of Visit: Established patient   PCP:  [name], MD  Primary HF: [name] Nephrologist: Dr Lorrene Reid EP: Dr Lovena Le  Chief Complaint: HF follow up   History of Present Illness: [name] [name] is a 68 y.o. female with a history of CAD s/p CABG in 12/05 and redo in 1/11 with DES in 2014 and 2015, bioprosthetic AVR, diastolic CHF, chronic iron deficiency anemia, Type 1 DM with insulin pump followed by [facility], OSA on CPAP, hx of short runs AF not on AC, and sick sinus syndrome s/p PCM.  In 9/14, she had a CPX test. This showed severe functional limitation with ischemic ECG changes (inferolateral ST depression and ST elevation in V1 and V2). She had chest pain. She had R/LHC in 9/14. This showed elevated left and right heart filling pressures, patent SVG-PDA, and 80% distal LM/80% ostial LAD stenosis. She was not a candidate for redo CABG (had 2 prior sternotomies as well as chest radiation for Hodgkins lymphoma). She had DES from left main into proximal LAD and will need long-term DAPT =>Brilinta was used as she has been a poor responder to Plavix in the past. She re-developed angina in 11/15 and was admitted with unstable angina. LHC showed 80% ostial LM and patent SVG-RCA. She had DES to ostial LM. Echo in 1/18 showed EF 60-65%, normal  bioprosthetic aortic valve, mild-moderate MR, PASP 35 mmHg.   Given ongoing problems with exertional dyspnea and occasional chest pain, Cardiolite was repeated in 9/18 and showed inferior changes. She underwent left/right heart cath in 9/18. This showed elevated filling pressures, R>L. There was 75+% ostial LCx stenosis. However, there were 2 layers of stent from the mid left main into the ostial LAD, and LCx intervention was thought to be very risky. It was decided to manage her medically. Echo was repeated in 10/18 showing EF 55-60%, moderate to severe TR.   Given recurrent chest pain, she had ETT-Cardiolite done in 3/19. This was a normal study with no evidence for ischemia or infarction.  Echo in 1/20 showed EF 55-60%, mild LVH, stable bioprosthetic aortic valve, moderate-severe TR.   Patient presents via audio conferencing for a telehealth visit today. She had a virtual visit with [name] 09/2018. She was doing well. Overall doing fine. On Sunday, she noticed a 3 inch x 3 inch spot on her left leg, described as bruising with petichiae and a dark vessel. No injury. Had some intermittent burning pain as well. She had a video call with her PCP, who thought it was vascular. It is now getting smaller (she has marked) and burning sensation is improving. She walks on treadmill 45-50 minutes at a time. No SOB unless she speeds it up or has incline. Has occasional bloating and rare BLE edema when she stands all day. Chronic orthopnea. Wears CPAP qHS. Gets dizzy at times after exerting  herself. Resolves with rest. Having some palpitations, device shows 0.3% AF burden. No angina. Creatinine 2.13 on labs last week per pt report. Weight is down 8-10 lbs in the last month, running 107-110 lbs on home scale. She is being seen by dietician and is eating more vegetables. BP 110/60s on home checks.   Pt denies symptoms of cough, fevers, chills, or new SOB worrisome for COVID 19.    Past Medical History:    Diagnosis Date   Anxiety    Aortic stenosis    a. bicuspid aortic valve; mean gradient of 20 mmHg in 2/10; b. s/p bioprosth AVR (19-mm Edwards pericardial valve) with a redo coronary artery bypass graft procedure in 07/2009; postoperative Dressler's syndrome     Arthritis    Breast cancer ([place])    Carcinoma, renal cell ([name]) 05/2007   Laparoscopic right nephrectomy   Chromophilic renal cell carcinoma (HCC)    Chronic diastolic CHF (congestive heart failure) ([place])    a. 9.2014 EchoP EF 60-65%, no rwma, bioprosth AVR, mean gradient of 43mHg, mild to mod MR, PASP 585mg.   CKD (chronic kidney disease), stage II    Colitis, ischemic ([place]2008   Coronary artery disease    a. initial CABG with SVG-RCA in 12/05. b. redo SVG-RCA and bioprosthetic AVR in 1/11. d. 9/14 - PCI with DES to distal LM/proximal LAD was done rather than bypass as she would have been a poor candidate for redo sternotomy. d. S/p DES to prox LM 05/2014.   Diabetes mellitus 03/2010    TYPE II: Hemoglobin A1c of 7.4 in  03/2010; 8.4 in 06/2010; treated with insulin pump   Duodenal ulcer    Remote; H. pylori positive   Gastroparesis due to DM ([place]CP[place]10/04/2012   Genetic testing 01/14/2017   [name] underwent genetic counseling and testing for hereditary cancer syndromes on 01/01/2017. Her results were negative for mutations in all 83 genes analyzed by [facility]'s 83-gene Common Hereditary Cancers Panel. Genes analyzed include: ALK, APC, ATM, AXIN2, BAP1, BARD1, BLM, BMPR1A, BRCA1, BRCA2, BRIP1, CASR, CDC73, CDH1, CDK4, CDKN1B, CDKN1C, CDKN2A, CEBPA, CHEK2, CTNNA1, DICER1, DIS3L2,    Hodgkin's disease (H[place]1991   Mantle radiation therapy   Hodgkin's disease ([place]   Hyperlipidemia    Hyperplastic gastric polyp 06/23/2012   Hypertension    Iron deficiency anemia    a. 03/2013 EGD: essentially normal. Possible slow GIB.   Migraines    NASH (nonalcoholic steatohepatitis) 1999   -biopsy in 1999   OSA on  CPAP 04/24/2017   Moderate with AHI 22/hr now on CPAP at 18cm H2o   Osteopenia     hip on DEXA in October 2007.   Osteoporosis    Oxygen deficiency    Pacemaker    six sinus syndrome   PAF (paroxysmal atrial fibrillation) (HCPee[place]   a.  h/o brief episodes noted on ppm interrogation. b. given GI bleeding and need to be on both ASA 81 and Brilinta, risks likely outweigh benefits of anticoagulation.    Small bowel obstruction (HCC)    Recurrent; resolved after resection of a lipoma   Syncope    a. H/o recurrent syncope with pauses on loop recorder s/p Medtronic pacemaker 07/2012.   Past Surgical History:  Procedure Laterality Date   ABDOMINAL HYSTERECTOMY     AORTIC VALVE REPLACEMENT  2011   Aortic valve replacement surgery with a 19 mm bioprosthetic device post redo CABG January 2011.   BONE MARROW BIOPSY  BREAST EXCISIONAL BIOPSY     benign, bilateral   CARDIAC CATHETERIZATION     CERVICAL BIOPSY     COLONOSCOPY     Multiple with adenomatous polyps   COLONOSCOPY  06/23/2012   Procedure: COLONOSCOPY;  Surgeon: Gatha Mayer, MD;  Location: [facility];  Service: Endoscopy;  Laterality: N/A;   CORONARY ARTERY BYPASS GRAFT  2005, 2011   CABG-most recent SVG to RCA-12/05; RCA occluded with patent graft in 2006; Redo bypass in 07/2009   ESOPHAGOGASTRODUODENOSCOPY     ESOPHAGOGASTRODUODENOSCOPY  06/23/2012   Procedure: ESOPHAGOGASTRODUODENOSCOPY (EGD);  Surgeon: Gatha Mayer, MD;  Location: [facility];  Service: Endoscopy;  Laterality: N/A;   ESOPHAGOGASTRODUODENOSCOPY N/A 04/06/2013   Procedure: ESOPHAGOGASTRODUODENOSCOPY (EGD);  Surgeon: Irene Shipper, MD;  Location: [facility] ENDOSCOPY;  Service: Endoscopy;  Laterality: N/A;   EXPLORATORY LAPAROTOMY W/ BOWEL RESECTION     Small bowel resection of lipoma   GIVENS CAPSULE STUDY N/A 04/30/2013   Procedure: GIVENS CAPSULE STUDY;  Surgeon: Gatha Mayer, MD;  Location: [facility];  Service: Endoscopy;  Laterality: N/A;     LEFT AND RIGHT HEART CATHETERIZATION WITH CORONARY ANGIOGRAM N/A 04/07/2013   Procedure: LEFT AND RIGHT HEART CATHETERIZATION WITH CORONARY ANGIOGRAM;  Surgeon: Larey Dresser, MD;  Location: [facility] CATH LAB;  Service: Cardiovascular;  Laterality: N/A;   LEFT ATRIAL APPENDAGE OCCLUSION N/A 02/16/2016   Watchman not placed - nickel allergy   LEFT HEART CATHETERIZATION WITH CORONARY/GRAFT ANGIOGRAM N/A 06/07/2014   Procedure: LEFT HEART CATHETERIZATION WITH Beatrix Fetters;  Surgeon: Burnell Blanks, MD;  Location: [facility] CATH LAB;  Service: Cardiovascular;  Laterality: N/A;   LIVER BIOPSY     LOOP RECORDER IMPLANT N/A 12/10/2011   Procedure: LOOP RECORDER IMPLANT;  Surgeon: Evans Lance, MD;  Location: [facility] CATH LAB;  Service: Cardiovascular;  Laterality: N/A;   MALONEY DILATION  06/23/2012   Procedure: [name] DILATION;  Surgeon: Gatha Mayer, MD;  Location: [facility] ENDOSCOPY;  Service: Endoscopy;;  1 fr   MASTECTOMY Left 05/17/2015   total    NEPHRECTOMY  2008   Laparoscopic right nephrectomy, renal cell carcinoma   PACEMAKER INSERTION  08/21/2012   Medtronic Adapta L dual-chamber pacemaker   PERCUTANEOUS CORONARY STENT INTERVENTION (PCI-S) N/A 04/09/2013   Procedure: PERCUTANEOUS CORONARY STENT INTERVENTION (PCI-S);  Surgeon: Burnell Blanks, MD;  Location: [facility] CATH LAB;  Service: Cardiovascular;  Laterality: N/A;   PERMANENT PACEMAKER INSERTION N/A 08/21/2012   Procedure: PERMANENT PACEMAKER INSERTION;  Surgeon: Evans Lance, MD;  Location: [facility] CATH LAB;  Service: Cardiovascular;  Laterality: N/A;   RIGHT HEART CATH AND CORONARY/GRAFT ANGIOGRAPHY N/A 04/19/2017   Procedure: RIGHT HEART CATH AND CORONARY/GRAFT ANGIOGRAPHY;  Surgeon: Larey Dresser, MD;  Location: [facility] CV LAB;  Service: Cardiovascular;  Laterality: N/A;   TEE WITHOUT CARDIOVERSION N/A 12/07/2015   Procedure: TRANSESOPHAGEAL ECHOCARDIOGRAM (TEE);  Surgeon: Larey Dresser, MD;  Location: [place];  Service: Cardiovascular;  Laterality: N/A;   TOTAL ABDOMINAL HYSTERECTOMY W/ BILATERAL SALPINGOOPHORECTOMY  2000   endometriosis and ovarian cysts   TOTAL MASTECTOMY Left 05/17/2015   Procedure: LEFT TOTAL MASTECTOMY;  Surgeon: Rolm Bookbinder, MD;  Location: [facility];  Service: General;  Laterality: Left;   TUMOR EXCISION  1981   removal of Hodgkins lymphoma     Current Outpatient Medications  Medication Sig Dispense Refill   amLODipine (NORVASC) 2.5 MG tablet TAKE ONE TABLET (2.5MG TOTAL) BY MOUTH DAILY 90 tablet 1   aspirin EC 81 MG  tablet Take 81 mg by mouth at bedtime.     BRILINTA 60 MG TABS tablet TAKE ONE TABLET (60 MG TOTAL) BY MOUTH TWICE DAILY. 180 tablet 2   isosorbide mononitrate (IMDUR) 120 MG 24 hr tablet Take 1 tablet (120 mg total) by mouth daily. 90 tablet 3   metoprolol tartrate (LOPRESSOR) 50 MG tablet TAKE ONE AND ONE-HALF TABLETS (75 MG) BYMOUTH TWICE DAILY. 270 tablet 2   PRALUENT 150 MG/ML SOAJ INJECT 150MG SUBCUTANEOUSLY EVERY 2 WEEKS 2 mL 6   pravastatin (PRAVACHOL) 10 MG tablet Take 1 tablet (10 mg total) by mouth daily. 90 tablet 3   spironolactone (ALDACTONE) 25 MG tablet Take 0.5 tablets (12.5 mg total) by mouth daily. 90 tablet 1   torsemide (DEMADEX) 20 MG tablet Take 40 mg by mouth daily. Take extra if needed for fluid     ALPRAZolam (XANAX) 0.5 MG tablet Take 1 tablet (0.5 mg total) by mouth 2 (two) times daily as needed for anxiety. (Patient taking differently: Take 0.5 mg by mouth 2 (two) times daily as needed for anxiety. Taken one at night) 60 tablet 1   APIDRA 100 UNIT/ML injection Inject 46 Units into the skin as directed. INFUSE THROUGH INSULIN PUMP UTD::[place] 46.15 units daily, Bolus depends on carb intake  2   Cholecalciferol 2000 UNITS TABS Take 1 tablet by mouth every morning.      clobetasol cream (TEMOVATE) 1.61 % Apply 1 application topically 2 (two) times daily as needed.      Coenzyme Q10 (CO Q 10) 100 MG CAPS Take  1 capsule by mouth at bedtime.      colchicine 0.6 MG tablet Take 0.6 mg by mouth daily as needed (gout flare).     docusate sodium (COLACE) 100 MG capsule Take 100 mg by mouth every other day.      famotidine (PEPCID) 40 MG tablet Take 40 mg by mouth 2 (two) times daily.      Fe Fum-FA-B Cmp-C-Zn-Mg-Mn-Cu (HEMATINIC PLUS VIT/MINERALS) 106-1 MG TABS TAKE 1 TABLET BY MOUTH THREE TIMES A WEEK(MONDAY, WEDNESDAY, AND FRIDAY) OR AS DIRECTED WITH A VITAMIN "C" 90 tablet 0   fluticasone (FLONASE) 50 MCG/ACT nasal spray Place 2 sprays into both nostrils daily. 48 g 3   glucagon (GLUCAGON EMERGENCY) 1 MG injection Inject 1 mg into the vein once as needed (for severe reaction). Reported on 12/05/2015     Icosapent Ethyl (VASCEPA) 1 g CAPS Take 2 capsules (2 g total) by mouth 2 (two) times daily. 360 capsule 3   latanoprost (XALATAN) 0.005 % ophthalmic solution Place 1 drop into both eyes at bedtime.     Magnesium Oxide 250 MG TABS Take 400 mg by mouth at bedtime.      metolazone (ZAROXOLYN) 2.5 MG tablet Take 2.5 mg (1 tab) once as directed by CHF clinic. Call before taking [phone] (Patient not taking: Reported on 12/02/2018) 3 tablet 0   Multiple Vitamins-Minerals (MULTIVITAMIN WITH MINERALS) tablet Take 1 tablet by mouth every morning.      mupirocin cream (BACTROBAN) 2 % Apply 1 application topically 2 (two) times daily as needed. 90 g 0   nitroGLYCERIN (NITROLINGUAL) 0.4 MG/SPRAY spray PLACE 1 SPRAY UNDER THE TONGUE EVERY 5 MINUTES FOR 3 DOSES AS NEEDED FOR CHEST PAIN (Patient not taking: Reported on 12/02/2018) 14.7 g 0   Polyethyl Glycol-Propyl Glycol (SYSTANE) 0.4-0.3 % SOLN Apply 1 drop to eye daily as needed (dry eyes).     silver nitrate applicators 11-91 % applicator  Use as needed to stop superficial skin bleeding. 10 each 0   triamcinolone cream (KENALOG) 0.5 % Apply 1 application topically daily as needed (inflamation).      ULORIC 40 MG tablet Take 1 tablet (40 mg total) by  mouth at bedtime. 90 tablet 3   UNABLE TO FIND Med Name: So Clean CPAP cleaning system 1 each 0   No current facility-administered medications for this encounter.     Allergies:   Avandia [rosiglitazone maleate]; Cephalexin; Clindamycin hcl; Lincomycin; Metformin; Novolog [insulin aspart]; Penicillins; Rosiglitazone; Tizanidine; Adhesive [tape]; Atorvastatin; Cholestyramine; Gentamycin [gentamicin]; Humalog [insulin lispro]; Iodinated diagnostic agents; Limonene; Pravastatin; Prednisone; Ranexa [ranolazine]; Rosuvastatin; Simvastatin; Strawberry extract; Clopidogrel; Doxycycline; Plavix [clopidogrel bisulfate]; Codeine; Erythromycin; Hydrocodone-acetaminophen; Latex; Neomycin; Nickel; Ranitidine; Tamiflu [oseltamivir]; and Tramadol   Social History:  The patient  reports that she has never smoked. She has never used smokeless tobacco. She reports that she does not drink alcohol or use drugs.   Family History:  The patient's family history includes Breast cancer (age of onset: 46) in her sister; Cancer (age of onset: 46) in her sister; Cancer (age of onset: 72) in her maternal grandfather; Diabetes in her father; Glaucoma in her brother; Heart attack in her father, maternal grandmother, and mother; Heart disease in her father and mother; Hypertension in her brother; Lung cancer in her sister.   ROS:  Please see the history of present illness.   All other systems are personally reviewed and negative.    Exam:  (Video/Tele Health Call; Exam is subjective and or/visual.) General:  Speaks in full sentences. No resp difficulty. Lungs: Normal respiratory effort with conversation.  Abdomen: No distension per patient report Extremities: Pt denies edema. Left leg with bruise, surrounded by petichiae.  Neuro: Alert & oriented x 3.   Recent Labs: 01/03/2018: Magnesium 2.7 06/24/2018: ALT 25; BUN 56; Creatinine, Ser 2.07; Hemoglobin 12.4; Platelets 129; Potassium 4.9; Sodium 137  Personally reviewed   Wt  Readings from Last 3 Encounters:  12/02/18 48.8 kg (107 lb 9.6 oz)  09/08/18 53.1 kg (117 lb)  07/09/18 52.3 kg (115 lb 3.2 oz)      ASSESSMENT AND PLAN:  1. CAD: s/p SVG-RCA in 12/05, then redo SVG-RCA in 1/11-->distal left main/proximal LAD severe stenosis in 9/14. PCI with DES to distal LM/proximal LAD was done rather than bypass as she would have been a poor candidate for redo sternotomy (2 prior sternotomies and history of chest wall radiation for Hodgkins disease). Recurrent unstable angina with 80% ostial LM treated with repeat DES in 11/15. Lexiscan Cardiolite in 9/18 inferior changes. Repeat LHC (9/18) showed 50% distal left main in-stent restenosis and 75+% ostial LCx stenosis. [name] reviewed the films at the time with interventional cardiology =>would be very difficult to intervene on the ostial LCx through 2 overlapping stents from left main into the LAD. 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 Given no ACS, we planned medical management and increased imdur. Cardiolite in 3/19 showed no ischemia or infarction.  - Continue Brilinta 60 mg bid (poor responder to plavix) long-term given recurrent left main disease. She has recent recurrent rectal bleeding thought to be diverticular. This has resolved.  - Continue ASA 81, statin, metoprolol, and Imdur 120 mg daily.  - She was unable to tolerate ranolazine.  - No recent CP  2. Bioprosthetic aortic valve replacement: In setting of bicuspid aortic valve, thoracic aorta not dilated on 2017 TEE. Valve looked ok on 1/20 echo.  3. Chronic diastolic CHF: Most recent echo in 1/20 showed normal LV systolic function but also showed moderate to severe TR. Last RHC was concerning for right >left heart failure.  - She is stable symptomatically, NYHA class II.  Volume sounds stable despite weight loss. - Continue torsemide40 mg daily.  -Continue spironolactone 12.5 mg daily. She had BMET at  nephrology office last week. Will request.  4. CKD: Patient has a single kidney s/p right nephrectomy and suspected diabetic nephropathy. CKD stage4, slowly progressive.   - Follows with nephrology. Had labs last week. Will request.  5. Hyperlipidemia: She has not been able to tolerate any higher potency statin than pravastatin 10 mg daily.Good lipids in 9/19. - Continue pravastatin and Praluent. Needs Praluent refill.  - She is on Vascepa for elevated TGs.   6. Anemia: Chronic/slow GI bleed. Unremarkable EGD and capsule endoscopy in fall 2014. Unfortunately, needs to stay on Brilinta long-term (Plavix non-responder). Recent rectal bleeding thought to be diverticular, has now resolved. Follows with GI.   7. Atrial fibrillation: Paroxysmal, brief episodes noted on PCM interrogation. Given GI bleeding and need to be on Brilinta, risks have been thought to outweigh benefits of anticoagulation. She is not a good Multaq candidate with CHF. If she needs an antiarrhythmic, consider Tikosyn or Sotalolbut renal function may make these meds difficult to use. Has occasional palpitations. 0.3% AF burden on PPM interrogation.  - Unable to place Watchman device due to nickel allergy.   8. Carotid bruit: Only mild stenosis on last dopplers in 3/18.   9. SSS: Pacemaker in place.   10. HTN: Stable on home checks  11. OSA: Moderate, continue CPAP. Follows with Dr Radford Pax. Adjusting to decrease events.   12. ?TIA: Transient dysarthria in 3/18, could have been afib-related TIA. No recurrent symptoms.   13. Thrombocytopenia: Chronic and mild. Follows with hematology. Sees them in the next few weeks. Had a bruise on left leg with no injury. Will request CBC from next week.     COVID screen The patient does not have any symptoms that suggest any further testing/ screening at this time.  Social distancing reinforced today.  Patient Risk: After full review of this patients clinical status, I feel  that they are at moderate risk for cardiac decompensation at this time.  Orders/Follow up: Follow up in 3 months with [name]. Will request labs from last week from nephrology. Request refills from Dr Debara Pickett for [place].   Today, I have spent 34 minutes with the patient with telehealth technology discussing the above issues.    Signed, Georgiana Shore, NP  12/02/2018 9:54 AM   [facility] [address] Heart and [place] [address] [phone] (office) [phone] (fax)

## 2018-12-02 NOTE — Addendum Note (Signed)
Encounter addended by: Marlise Eves, RN on: 12/02/2018 10:27 AM  Actions taken: Clinical Note Signed

## 2018-12-05 ENCOUNTER — Other Ambulatory Visit (HOSPITAL_COMMUNITY): Payer: Self-pay | Admitting: Cardiology

## 2018-12-10 NOTE — Progress Notes (Signed)
Remote pacemaker transmission.   

## 2018-12-18 ENCOUNTER — Encounter (HOSPITAL_COMMUNITY): Payer: Self-pay

## 2018-12-18 NOTE — Telephone Encounter (Signed)
FYI, see above

## 2018-12-23 ENCOUNTER — Other Ambulatory Visit (HOSPITAL_COMMUNITY): Payer: Self-pay | Admitting: *Deleted

## 2018-12-23 DIAGNOSIS — D500 Iron deficiency anemia secondary to blood loss (chronic): Secondary | ICD-10-CM

## 2018-12-23 DIAGNOSIS — D696 Thrombocytopenia, unspecified: Secondary | ICD-10-CM

## 2018-12-23 DIAGNOSIS — N183 Chronic kidney disease, stage 3 unspecified: Secondary | ICD-10-CM

## 2018-12-24 ENCOUNTER — Inpatient Hospital Stay (HOSPITAL_COMMUNITY): Payer: Medicare Other | Attending: Hematology

## 2018-12-24 ENCOUNTER — Other Ambulatory Visit (HOSPITAL_COMMUNITY): Payer: Medicare Other

## 2018-12-24 ENCOUNTER — Other Ambulatory Visit: Payer: Self-pay

## 2018-12-24 DIAGNOSIS — Z79899 Other long term (current) drug therapy: Secondary | ICD-10-CM | POA: Diagnosis not present

## 2018-12-24 DIAGNOSIS — Z951 Presence of aortocoronary bypass graft: Secondary | ICD-10-CM | POA: Diagnosis not present

## 2018-12-24 DIAGNOSIS — G4733 Obstructive sleep apnea (adult) (pediatric): Secondary | ICD-10-CM | POA: Diagnosis not present

## 2018-12-24 DIAGNOSIS — R600 Localized edema: Secondary | ICD-10-CM | POA: Insufficient documentation

## 2018-12-24 DIAGNOSIS — D696 Thrombocytopenia, unspecified: Secondary | ICD-10-CM | POA: Diagnosis not present

## 2018-12-24 DIAGNOSIS — E1122 Type 2 diabetes mellitus with diabetic chronic kidney disease: Secondary | ICD-10-CM | POA: Diagnosis not present

## 2018-12-24 DIAGNOSIS — R200 Anesthesia of skin: Secondary | ICD-10-CM | POA: Insufficient documentation

## 2018-12-24 DIAGNOSIS — Z7982 Long term (current) use of aspirin: Secondary | ICD-10-CM | POA: Diagnosis not present

## 2018-12-24 DIAGNOSIS — M810 Age-related osteoporosis without current pathological fracture: Secondary | ICD-10-CM | POA: Diagnosis not present

## 2018-12-24 DIAGNOSIS — R0609 Other forms of dyspnea: Secondary | ICD-10-CM | POA: Diagnosis not present

## 2018-12-24 DIAGNOSIS — Z7951 Long term (current) use of inhaled steroids: Secondary | ICD-10-CM | POA: Insufficient documentation

## 2018-12-24 DIAGNOSIS — R5383 Other fatigue: Secondary | ICD-10-CM | POA: Insufficient documentation

## 2018-12-24 DIAGNOSIS — Z9012 Acquired absence of left breast and nipple: Secondary | ICD-10-CM | POA: Insufficient documentation

## 2018-12-24 DIAGNOSIS — Z86000 Personal history of in-situ neoplasm of breast: Secondary | ICD-10-CM | POA: Diagnosis not present

## 2018-12-24 DIAGNOSIS — N183 Chronic kidney disease, stage 3 unspecified: Secondary | ICD-10-CM

## 2018-12-24 DIAGNOSIS — I2510 Atherosclerotic heart disease of native coronary artery without angina pectoris: Secondary | ICD-10-CM | POA: Diagnosis not present

## 2018-12-24 DIAGNOSIS — D500 Iron deficiency anemia secondary to blood loss (chronic): Secondary | ICD-10-CM

## 2018-12-24 DIAGNOSIS — Z85528 Personal history of other malignant neoplasm of kidney: Secondary | ICD-10-CM | POA: Insufficient documentation

## 2018-12-24 DIAGNOSIS — M1990 Unspecified osteoarthritis, unspecified site: Secondary | ICD-10-CM | POA: Diagnosis not present

## 2018-12-24 DIAGNOSIS — Z8571 Personal history of Hodgkin lymphoma: Secondary | ICD-10-CM | POA: Insufficient documentation

## 2018-12-24 DIAGNOSIS — I5032 Chronic diastolic (congestive) heart failure: Secondary | ICD-10-CM | POA: Insufficient documentation

## 2018-12-24 DIAGNOSIS — M545 Low back pain: Secondary | ICD-10-CM | POA: Insufficient documentation

## 2018-12-24 DIAGNOSIS — N182 Chronic kidney disease, stage 2 (mild): Secondary | ICD-10-CM | POA: Insufficient documentation

## 2018-12-24 DIAGNOSIS — E785 Hyperlipidemia, unspecified: Secondary | ICD-10-CM | POA: Insufficient documentation

## 2018-12-24 DIAGNOSIS — I480 Paroxysmal atrial fibrillation: Secondary | ICD-10-CM | POA: Insufficient documentation

## 2018-12-24 DIAGNOSIS — I130 Hypertensive heart and chronic kidney disease with heart failure and stage 1 through stage 4 chronic kidney disease, or unspecified chronic kidney disease: Secondary | ICD-10-CM | POA: Insufficient documentation

## 2018-12-24 LAB — CBC WITH DIFFERENTIAL/PLATELET
Abs Immature Granulocytes: 0.04 10*3/uL (ref 0.00–0.07)
Basophils Absolute: 0.1 10*3/uL (ref 0.0–0.1)
Basophils Relative: 1 %
Eosinophils Absolute: 0.1 10*3/uL (ref 0.0–0.5)
Eosinophils Relative: 2 %
HCT: 38.4 % (ref 36.0–46.0)
Hemoglobin: 12.1 g/dL (ref 12.0–15.0)
Immature Granulocytes: 1 %
Lymphocytes Relative: 18 %
Lymphs Abs: 1.5 10*3/uL (ref 0.7–4.0)
MCH: 29.7 pg (ref 26.0–34.0)
MCHC: 31.5 g/dL (ref 30.0–36.0)
MCV: 94.1 fL (ref 80.0–100.0)
Monocytes Absolute: 0.6 10*3/uL (ref 0.1–1.0)
Monocytes Relative: 7 %
Neutro Abs: 6 10*3/uL (ref 1.7–7.7)
Neutrophils Relative %: 71 %
Platelets: 121 10*3/uL — ABNORMAL LOW (ref 150–400)
RBC: 4.08 MIL/uL (ref 3.87–5.11)
RDW: 14.3 % (ref 11.5–15.5)
WBC: 8.2 10*3/uL (ref 4.0–10.5)
nRBC: 0 % (ref 0.0–0.2)

## 2018-12-24 LAB — COMPREHENSIVE METABOLIC PANEL
ALT: 21 U/L (ref 0–44)
AST: 24 U/L (ref 15–41)
Albumin: 4.7 g/dL (ref 3.5–5.0)
Alkaline Phosphatase: 140 U/L — ABNORMAL HIGH (ref 38–126)
Anion gap: 17 — ABNORMAL HIGH (ref 5–15)
BUN: 53 mg/dL — ABNORMAL HIGH (ref 8–23)
CO2: 23 mmol/L (ref 22–32)
Calcium: 10.3 mg/dL (ref 8.9–10.3)
Chloride: 101 mmol/L (ref 98–111)
Creatinine, Ser: 1.82 mg/dL — ABNORMAL HIGH (ref 0.44–1.00)
GFR calc Af Amer: 33 mL/min — ABNORMAL LOW (ref >60)
GFR calc non Af Amer: 29 mL/min — ABNORMAL LOW (ref >60)
Glucose, Bld: 226 mg/dL — ABNORMAL HIGH (ref 70–99)
Potassium: 4.6 mmol/L (ref 3.5–5.1)
Sodium: 141 mmol/L (ref 135–145)
Total Bilirubin: 0.8 mg/dL (ref 0.3–1.2)
Total Protein: 8.3 g/dL — ABNORMAL HIGH (ref 6.5–8.1)

## 2018-12-24 LAB — LACTATE DEHYDROGENASE: LDH: 143 U/L (ref 98–192)

## 2018-12-25 ENCOUNTER — Telehealth: Payer: Self-pay | Admitting: Hematology

## 2018-12-25 NOTE — Telephone Encounter (Signed)
A new hem appt has been scheduled for the pt to see Dr. Irene Limbo on 6/18 at 11am. She is also an established pt of Dr. Delton Coombes at [facility] w/an appt on 6/10. Pt has been told that insurance may not pay for two of the same specialty MDs. Voiced understanding.

## 2018-12-30 ENCOUNTER — Other Ambulatory Visit: Payer: Self-pay

## 2018-12-31 ENCOUNTER — Encounter (HOSPITAL_COMMUNITY): Payer: Self-pay | Admitting: Hematology

## 2018-12-31 ENCOUNTER — Inpatient Hospital Stay (HOSPITAL_COMMUNITY): Payer: Medicare Other

## 2018-12-31 ENCOUNTER — Inpatient Hospital Stay (HOSPITAL_COMMUNITY): Payer: Medicare Other | Admitting: Hematology

## 2018-12-31 ENCOUNTER — Other Ambulatory Visit: Payer: Self-pay

## 2018-12-31 DIAGNOSIS — D696 Thrombocytopenia, unspecified: Secondary | ICD-10-CM | POA: Diagnosis not present

## 2018-12-31 DIAGNOSIS — Z7982 Long term (current) use of aspirin: Secondary | ICD-10-CM

## 2018-12-31 DIAGNOSIS — M545 Low back pain: Secondary | ICD-10-CM

## 2018-12-31 DIAGNOSIS — N183 Chronic kidney disease, stage 3 unspecified: Secondary | ICD-10-CM

## 2018-12-31 DIAGNOSIS — R0609 Other forms of dyspnea: Secondary | ICD-10-CM

## 2018-12-31 DIAGNOSIS — D500 Iron deficiency anemia secondary to blood loss (chronic): Secondary | ICD-10-CM

## 2018-12-31 DIAGNOSIS — Z79899 Other long term (current) drug therapy: Secondary | ICD-10-CM

## 2018-12-31 LAB — CBC WITH DIFFERENTIAL/PLATELET
Abs Immature Granulocytes: 0.04 10*3/uL (ref 0.00–0.07)
Basophils Absolute: 0.1 10*3/uL (ref 0.0–0.1)
Basophils Relative: 1 %
Eosinophils Absolute: 0.1 10*3/uL (ref 0.0–0.5)
Eosinophils Relative: 1 %
HCT: 38.1 % (ref 36.0–46.0)
Hemoglobin: 12.4 g/dL (ref 12.0–15.0)
Immature Granulocytes: 1 %
Lymphocytes Relative: 16 %
Lymphs Abs: 1.3 10*3/uL (ref 0.7–4.0)
MCH: 30.2 pg (ref 26.0–34.0)
MCHC: 32.5 g/dL (ref 30.0–36.0)
MCV: 92.7 fL (ref 80.0–100.0)
Monocytes Absolute: 0.6 10*3/uL (ref 0.1–1.0)
Monocytes Relative: 7 %
Neutro Abs: 6.1 10*3/uL (ref 1.7–7.7)
Neutrophils Relative %: 74 %
Platelets: 119 10*3/uL — ABNORMAL LOW (ref 150–400)
RBC: 4.11 MIL/uL (ref 3.87–5.11)
RDW: 13.9 % (ref 11.5–15.5)
WBC: 8.2 10*3/uL (ref 4.0–10.5)
nRBC: 0 % (ref 0.0–0.2)

## 2018-12-31 LAB — COMPREHENSIVE METABOLIC PANEL
ALT: 23 U/L (ref 0–44)
AST: 26 U/L (ref 15–41)
Albumin: 5 g/dL (ref 3.5–5.0)
Alkaline Phosphatase: 124 U/L (ref 38–126)
Anion gap: 11 (ref 5–15)
BUN: 45 mg/dL — ABNORMAL HIGH (ref 8–23)
CO2: 27 mmol/L (ref 22–32)
Calcium: 10.5 mg/dL — ABNORMAL HIGH (ref 8.9–10.3)
Chloride: 103 mmol/L (ref 98–111)
Creatinine, Ser: 1.47 mg/dL — ABNORMAL HIGH (ref 0.44–1.00)
GFR calc Af Amer: 43 mL/min — ABNORMAL LOW (ref >60)
GFR calc non Af Amer: 37 mL/min — ABNORMAL LOW (ref >60)
Glucose, Bld: 93 mg/dL (ref 70–99)
Potassium: 4.7 mmol/L (ref 3.5–5.1)
Sodium: 141 mmol/L (ref 135–145)
Total Bilirubin: 0.7 mg/dL (ref 0.3–1.2)
Total Protein: 8.8 g/dL — ABNORMAL HIGH (ref 6.5–8.1)

## 2018-12-31 LAB — LACTATE DEHYDROGENASE: LDH: 150 U/L (ref 98–192)

## 2018-12-31 NOTE — Progress Notes (Signed)
Patient Care Team: [name], MD as PCP - General (Family Medicine) Evans Lance, MD as PCP - Electrophysiology (Cardiology) Sueanne Margarita, MD as PCP - Sleep Medicine (Sleep Medicine) Larey Dresser, MD as PCP - Advanced Heart Failure (Cardiology) Rothbart, Cristopher Estimable, MD (Cardiology) Gatha Mayer, MD (Gastroenterology) Altheimer, Legrand Como, MD (Endocrinology) Raynelle Bring, MD (Urology) [name], MD (Hematology and Oncology) [name], MD as Referring Physician (Endocrinology)  DIAGNOSIS:  Encounter Diagnoses  Name Primary?  . Hypercalcemia Yes  . Thrombocytopenia (HCC)      CHIEF COMPLIANT: Follow-up of thrombocytopenia and mild hypercalcemia.  INTERVAL HISTORY: [name] [name] seen for follow-up regarding her thrombocytopenia.  She also has mild hypercalcemia.  She reports easy bruising.  She does report some shortness of breath with walking.  Low back pain which is a few months duration reported.  Numbness in the hands and feet has been stable.  Appetite is 75% and energy levels are 25%.  REVIEW OF SYSTEMS:   Constitutional: Denies fevers, chills or abnormal weight loss Eyes: Denies blurriness of vision Ears, nose, mouth, throat, and face: Denies mucositis or sore throat Respiratory: Denies cough, dyspnea or wheezes Cardiovascular: Denies palpitation, chest discomfort Gastrointestinal: Positive for nausea and constipation. Skin: Denies abnormal skin rashes.  Reports easy bruising. Lymphatics: Denies new lymphadenopathy or easy bruising Neurological: Positive for numbness and headaches. Behavioral/Psych: Mood is stable, no new changes  Extremities: No lower extremity edema Breast:  denies any pain or lumps or nodules in her right breast All other systems were reviewed with the patient and are negative.  I have reviewed the past medical history, past surgical history, social history and family history with the patient and they are unchanged from  previous note.  ALLERGIES:  is allergic to avandia [rosiglitazone maleate]; cephalexin; clindamycin hcl; lincomycin; metformin; novolog [insulin aspart]; penicillins; rosiglitazone; tizanidine; adhesive [tape]; atorvastatin; cholestyramine; gentamycin [gentamicin]; humalog [insulin lispro]; iodinated diagnostic agents; limonene; pravastatin; prednisone; ranexa [ranolazine]; rosuvastatin; simvastatin; strawberry extract; clopidogrel; doxycycline; plavix [clopidogrel bisulfate]; codeine; erythromycin; hydrocodone-acetaminophen; latex; neomycin; nickel; ranitidine; tamiflu [oseltamivir]; and tramadol.  MEDICATIONS:  Current Outpatient Medications  Medication Sig Dispense Refill  . ALPRAZolam (XANAX) 0.5 MG tablet Take 1 tablet (0.5 mg total) by mouth 2 (two) times daily as needed for anxiety. (Patient taking differently: Take 0.5 mg by mouth 2 (two) times daily as needed for anxiety. Taken one at night) 60 tablet 1  . amLODipine (NORVASC) 2.5 MG tablet TAKE ONE TABLET (2.5MG TOTAL) BY MOUTH DAILY 90 tablet 1  . APIDRA 100 UNIT/ML injection Inject 46 Units into the skin as directed. INFUSE THROUGH INSULIN PUMP UTD::[place] 46.15 units daily, Bolus depends on carb intake  2  . aspirin EC 81 MG tablet Take 81 mg by mouth at bedtime.    Marland Kitchen BRILINTA 60 MG TABS tablet TAKE ONE TABLET (60 MG TOTAL) BY MOUTH TWICE DAILY. 180 tablet 2  . Cholecalciferol 2000 UNITS TABS Take 1 tablet by mouth every morning.     . clobetasol cream (TEMOVATE) 0.38 % Apply 1 application topically 2 (two) times daily as needed.     . Coenzyme Q10 (CO Q 10) 100 MG CAPS Take 1 capsule by mouth at bedtime.     . colchicine 0.6 MG tablet Take 0.6 mg by mouth daily as needed (gout flare).    Marland Kitchen docusate sodium (COLACE) 100 MG capsule Take 100 mg by mouth every other day.     . famotidine (PEPCID) 40 MG tablet Take 40  mg by mouth daily.     . Fe Fum-FA-B Cmp-C-Zn-Mg-Mn-Cu (HEMATINIC PLUS VIT/MINERALS) 106-1 MG TABS TAKE 1 TABLET BY MOUTH  THREE TIMES A WEEK(MONDAY, WEDNESDAY, AND FRIDAY) OR AS DIRECTED WITH A VITAMIN "C" 90 tablet 0  . fluticasone (FLONASE) 50 MCG/ACT nasal spray Place 2 sprays into both nostrils daily. 48 g 3  . glucagon (GLUCAGON EMERGENCY) 1 MG injection Inject 1 mg into the vein once as needed (for severe reaction). Reported on 12/05/2015    . Icosapent Ethyl (VASCEPA) 1 g CAPS Take 2 capsules (2 g total) by mouth 2 (two) times daily. 360 capsule 3  . isosorbide mononitrate (IMDUR) 120 MG 24 hr tablet Take 1 tablet (120 mg total) by mouth daily. 90 tablet 3  . latanoprost (XALATAN) 0.005 % ophthalmic solution Place 1 drop into both eyes at bedtime.    . Magnesium Oxide 250 MG TABS Take 400 mg by mouth at bedtime.     . metoprolol tartrate (LOPRESSOR) 50 MG tablet TAKE ONE AND ONE-HALF TABLETS (75 MG) BYMOUTH TWICE DAILY. 270 tablet 2  . mupirocin cream (BACTROBAN) 2 % Apply 1 application topically 2 (two) times daily as needed. 90 g 0  . Polyethyl Glycol-Propyl Glycol (SYSTANE) 0.4-0.3 % SOLN Apply 1 drop to eye daily as needed (dry eyes).    . PRALUENT 150 MG/ML SOAJ INJECT 150MG SUBCUTANEOUSLY EVERY 2 WEEKS 6 mL 3  . pravastatin (PRAVACHOL) 10 MG tablet Take 1 tablet (10 mg total) by mouth daily. 90 tablet 3  . silver nitrate applicators 27-03 % applicator Use as needed to stop superficial skin bleeding. 10 each 0  . spironolactone (ALDACTONE) 25 MG tablet Take 0.5 tablets (12.5 mg total) by mouth daily. 90 tablet 1  . torsemide (DEMADEX) 20 MG tablet Take 40 mg by mouth daily. Take extra if needed for fluid    . triamcinolone cream (KENALOG) 0.5 % Apply 1 application topically daily as needed (inflamation).     Marland Kitchen ULORIC 40 MG tablet Take 1 tablet (40 mg total) by mouth at bedtime. 90 tablet 3  . UNABLE TO FIND Med Name: So Clean CPAP cleaning system 1 each 0  . metolazone (ZAROXOLYN) 2.5 MG tablet Take 2.5 mg (1 tab) once as directed by CHF clinic. Call before taking [phone] (Patient not taking: Reported  on 12/02/2018) 3 tablet 0  . Multiple Vitamins-Minerals (MULTIVITAMIN WITH MINERALS) tablet Take 1 tablet by mouth every morning.     . nitroGLYCERIN (NITROLINGUAL) 0.4 MG/SPRAY spray PLACE 1 SPRAY UNDER THE TONGUE EVERY 5 MINUTES FOR 3 DOSES AS NEEDED FOR CHEST PAIN (Patient not taking: Reported on 12/02/2018) 14.7 g 0   No current facility-administered medications for this visit.     PHYSICAL EXAMINATION: ECOG PERFORMANCE STATUS: 1 - Symptomatic but completely ambulatory  Vitals:   12/31/18 1134  BP: (!) 144/47  Pulse: 76  Resp: 18  Temp: 97.8 F (36.6 C)  SpO2: 100%   Filed Weights   12/31/18 1134  Weight: 112 lb 8 oz (51 kg)    GENERAL:alert, no distress and comfortable SKIN: skin color, texture, turgor are normal, no rashes or significant lesions EYES: normal, Conjunctiva are pink and non-injected, sclera clear NECK: supple, thyroid normal size, non-tender, without nodularity LYMPH:  no palpable lymphadenopathy in the cervical, axillary or inguinal LUNGS: clear to auscultation and percussion with normal breathing effort HEART: regular rate & rhythm and no murmurs and no lower extremity edema ABDOMEN:abdomen soft, non-tender and normal bowel sounds MUSCULOSKELETAL:no cyanosis of  digits and no clubbing   EXTREMITIES: No lower extremity edema   LABORATORY DATA:  I have reviewed the data as listed CMP Latest Ref Rng & Units 12/31/2018 12/24/2018 06/24/2018  Glucose 70 - 99 mg/dL 93 226(H) 227(H)  BUN 8 - 23 mg/dL 45(H) 53(H) 56(H)  Creatinine 0.44 - 1.00 mg/dL 1.47(H) 1.82(H) 2.07(H)  Sodium 135 - 145 mmol/L 141 141 137  Potassium 3.5 - 5.1 mmol/L 4.7 4.6 4.9  Chloride 98 - 111 mmol/L 103 101 100  CO2 22 - 32 mmol/L 27 23 28   Calcium 8.9 - 10.3 mg/dL 10.5(H) 10.3 10.4(H)  Total Protein 6.5 - 8.1 g/dL 8.8(H) 8.3(H) 8.7(H)  Total Bilirubin 0.3 - 1.2 mg/dL 0.7 0.8 0.9  Alkaline Phos 38 - 126 U/L 124 140(H) 142(H)  AST 15 - 41 U/L 26 24 37  ALT 0 - 44 U/L 23 21 25    No  results found for: [id number]   Lab Results  Component Value Date   WBC 8.2 12/31/2018   HGB 12.4 12/31/2018   HCT 38.1 12/31/2018   MCV 92.7 12/31/2018   PLT 119 (L) 12/31/2018   NEUTROABS 6.1 12/31/2018    ASSESSMENT & PLAN:  Thrombocytopenia ([place]) 1.  Mild thrombocytopenia: -Previous connective tissue work-up was negative.  CT abdomen did not show any splenomegaly.  Peripheral blood smear showed giant platelets. -This was clinically consistent with immune mediated thrombocytopenia.  She does not have any bleeding complications. -She has stable blood count around 120.  She does report some bruising but she is on Brilinta. -We will continue monitor her platelets at 6 monthly intervals.  2.  Hypercalcemia: -She has mild hypercalcemia.  She is not on any calcium supplements since November 2019. - Her nephrologist has checked for SPEP which did not show M spike.  Free kappa light chains were slightly elevated at 64 and lambda light chains at 37.2.  Ratio is 1.73. - PTH related peptide was less than 2.  Intact PTH was 36.  Vitamin D level was 75.  Calcitriol was 16.5. -She does report mild back pain for the last few months.  I have recommended doing a bone scan. -We will also do a 24-hour urine for calcium.  We will check urine free light chains. -Mildly elevated serum free light chain ratio is likely from her CKD.  We will also check a serum immunofixation.  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  She is not on tamoxifen therapy. - Last mammogram in December 2019 of the right breast was benign. - I did not do breast exam today.   3   Stage IIb Hodgkin's disease: -She received mantle radiation in 1981.  She continues to be remission since then. -She does not have any B symptoms.  No palpable lymphadenopathy or splenomegaly.    4.  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 -I have reviewed CT scan dated 04/14/2018 which shows  stable 14 mm left renal lesion.  5.  Osteoporosis: Last bone density test on 12/25/2017 showed left hip T score of -3.2.  She is taking calcium and vitamin D.  She was never treated with bisphosphonates.  She follows up with endocrinologist at [facility].    Orders Placed This Encounter  Procedures  . NM Bone Scan Whole Body    Standing Status:   Future    Standing Expiration Date:   12/31/2019    Order Specific Question:   ** REASON FOR  EXAM (FREE TEXT)    Answer:   hypercalcemia, and History of maglinancy    Order Specific Question:   If indicated for the ordered procedure, I authorize the administration of a radiopharmaceutical per Radiology protocol    Answer:   Yes    Order Specific Question:   Preferred imaging location?    Answer:   [facility]    Order Specific Question:   Radiology Contrast Protocol - do NOT remove file path    Answer:   [email or web address]  . Immunofixation electrophoresis    Standing Status:   Future    Number of Occurrences:   1    Standing Expiration Date:   12/31/2019  . PTH, intact and calcium    Standing Status:   Future    Number of Occurrences:   1    Standing Expiration Date:   12/31/2019  . 24 Hr Urn UIFE/Light Chains/TP QN    Standing Status:   Future    Number of Occurrences:   1    Standing Expiration Date:   12/31/2019  . Calcium, Urine, 24 Hour    Standing Status:   Future    Number of Occurrences:   1    Standing Expiration Date:   12/31/2019   The patient has a good understanding of the overall plan. she agrees with it. she will call with any problems that may develop before the next visit here.   Derek Jack, MD 12/31/18

## 2018-12-31 NOTE — Assessment & Plan Note (Signed)
1.  Mild thrombocytopenia: -Previous connective tissue work-up was negative.  CT abdomen did not show any splenomegaly.  Peripheral blood smear showed giant platelets. -This was clinically consistent with immune mediated thrombocytopenia.  She does not have any bleeding complications. -She has stable blood count around 120.  She does report some bruising but she is on Brilinta. -We will continue monitor her platelets at 6 monthly intervals.  2.  Hypercalcemia: -She has mild hypercalcemia.  She is not on any calcium supplements since November 2019. - Her nephrologist has checked for SPEP which did not show M spike.  Free kappa light chains were slightly elevated at 64 and lambda light chains at 37.2.  Ratio is 1.73. - PTH related peptide was less than 2.  Intact PTH was 36.  Vitamin D level was 75.  Calcitriol was 16.5. -She does report mild back pain for the last few months.  I have recommended doing a bone scan. -We will also do a 24-hour urine for calcium.  We will check urine free light chains. -Mildly elevated serum free light chain ratio is likely from her CKD.  We will also check a serum immunofixation.  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  She is not on tamoxifen therapy. - Last mammogram in December 2019 of the right breast was benign. - I did not do breast exam today.   3   Stage IIb Hodgkin's disease: -She received mantle radiation in 1981.  She continues to be remission since then. -She does not have any B symptoms.  No palpable lymphadenopathy or splenomegaly.    4.  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 -I have reviewed CT scan dated 04/14/2018 which shows stable 14 mm left renal lesion.  5.  Osteoporosis: Last bone density test on 12/25/2017 showed left hip T score of -3.2.  She is taking calcium and vitamin D.  She was never treated with bisphosphonates.  She follows up with endocrinologist at [facility].

## 2018-12-31 NOTE — Patient Instructions (Addendum)
[place] at [facility] Discharge Instructions  You were seen today by Dr. Delton Coombes. He went over your recent lab results. He will see you back after your scan for labs and follow up.   Thank you for choosing [place] at [facility] to provide your oncology and hematology care.  To afford each patient quality time with our provider, please arrive at least 15 minutes before your scheduled appointment time.   If you have a lab appointment with the [place] please come in thru the  Main Entrance and check in at the main information desk  You need to re-schedule your appointment should you arrive 10 or more minutes late.  We strive to give you quality time with our providers, and arriving late affects you and other patients whose appointments are after yours.  Also, if you no show three or more times for appointments you may be dismissed from the clinic at the providers discretion.     Again, thank you for choosing [facility].  Our hope is that these requests will decrease the amount of time that you wait before being seen by our physicians.       _____________________________________________________________  Should you have questions after your visit to [facility], please contact our office at [phone] between the hours of 8:00 a.m. and 4:30 p.m.  Voicemails left after 4:00 p.m. will not be returned until the following business day.  For prescription refill requests, have your pharmacy contact our office and allow 72 hours.    Cancer Center Support Programs:   > Cancer Support Group  2nd Tuesday of the month 1pm-2pm, Journey Room

## 2019-01-01 LAB — PTH, INTACT AND CALCIUM
Calcium, Total (PTH): 10.5 mg/dL — ABNORMAL HIGH (ref 8.7–10.3)
PTH: 19 pg/mL (ref 15–65)

## 2019-01-02 ENCOUNTER — Other Ambulatory Visit (HOSPITAL_COMMUNITY): Payer: Self-pay | Admitting: *Deleted

## 2019-01-02 DIAGNOSIS — D696 Thrombocytopenia, unspecified: Secondary | ICD-10-CM | POA: Diagnosis not present

## 2019-01-02 LAB — IMMUNOFIXATION ELECTROPHORESIS
IgA: 311 mg/dL (ref 87–352)
IgG (Immunoglobin G), Serum: 1260 mg/dL (ref 586–1602)
IgM (Immunoglobulin M), Srm: 220 mg/dL — ABNORMAL HIGH (ref 26–217)
Total Protein ELP: 8.1 g/dL (ref 6.0–8.5)

## 2019-01-03 LAB — CALCIUM, URINE, 24 HOUR
Calcium, 24 hour urine: 125 mg/24 hr (ref 47–462)
Calcium, Ur: 5.7 mg/dL
Total Volume: 2200

## 2019-01-05 ENCOUNTER — Telehealth: Payer: Self-pay | Admitting: *Deleted

## 2019-01-05 LAB — UIFE/LIGHT CHAINS/TP QN, 24-HR UR
FR KAPPA LT CH,24HR: 31.83 mg/24 hr
FR LAMBDA LT CH,24HR: 4.44 mg/24 hr
Free Kappa Lt Chains,Ur: 14.47 mg/L (ref 0.63–113.79)
Free Kappa/Lambda Ratio: 7.16 (ref 1.03–31.76)
Free Lambda Lt Chains,Ur: 2.02 mg/L (ref 0.47–11.77)
Total Protein, Urine-Ur/day: 172 mg/24 hr — ABNORMAL HIGH (ref 30–150)
Total Protein, Urine: 7.8 mg/dL
Total Volume: 2200

## 2019-01-05 LAB — UPEP/TP, 24-HR URINE
Albumin, U: 100 %
Alpha 1, Urine: 0 %
Alpha 2, Urine: 0 %
Beta, Urine: 0 %
Gamma Globulin, Urine: 0 %
Total Protein, Urine-Ur/day: 165 mg/24 hr — ABNORMAL HIGH (ref 30–150)
Total Protein, Urine: 7.5 mg/dL
Total Volume: 2200

## 2019-01-05 NOTE — Telephone Encounter (Signed)
Attempted to contact patient per Dr. Grier Mitts directions: patient saw Dr. Lamonte Richer at [facility] on 12/31/2018. Patient does not need to be seen by Dr. Irene Limbo for same condition. Patient has appointments made w/Dr. Irene Limbo at [facility] that will be cancelled. Asked patient to contact [facility] [phone] to  Notify office that she received this message.

## 2019-01-07 NOTE — Telephone Encounter (Signed)
Contacted patient today 01/07/2019 to confirm cancellation of appt with Dr.Kale for reasons noted previously. Patient in agreement. Appts for Dr.Kale/lab on 6/18 cancelled.

## 2019-01-08 ENCOUNTER — Encounter (HOSPITAL_COMMUNITY): Payer: Self-pay

## 2019-01-08 ENCOUNTER — Other Ambulatory Visit: Payer: Self-pay

## 2019-01-08 ENCOUNTER — Other Ambulatory Visit: Payer: Medicare Other

## 2019-01-08 ENCOUNTER — Encounter (HOSPITAL_COMMUNITY)
Admission: RE | Admit: 2019-01-08 | Discharge: 2019-01-08 | Disposition: A | Discharge status: Home / Self Care | Payer: Medicare Other | Source: Ambulatory Visit | Attending: Hematology | Admitting: Hematology

## 2019-01-08 ENCOUNTER — Encounter: Payer: Medicare Other | Admitting: Hematology

## 2019-01-08 HISTORY — DX: Disorder of kidney and ureter, unspecified: N28.9

## 2019-01-08 MED ORDER — TECHNETIUM TC 99M MEDRONATE IV KIT
20.0000 | PACK | Freq: Once | INTRAVENOUS | Status: AC | PRN
  Administered 2019-01-08: 19.9 via INTRAVENOUS

## 2019-01-09 ENCOUNTER — Other Ambulatory Visit (HOSPITAL_COMMUNITY): Payer: Self-pay | Admitting: Cardiology

## 2019-01-09 ENCOUNTER — Other Ambulatory Visit: Payer: Self-pay

## 2019-01-09 DIAGNOSIS — D509 Iron deficiency anemia, unspecified: Secondary | ICD-10-CM

## 2019-01-12 ENCOUNTER — Other Ambulatory Visit (HOSPITAL_COMMUNITY): Payer: Self-pay | Admitting: *Deleted

## 2019-01-12 ENCOUNTER — Encounter (HOSPITAL_COMMUNITY): Payer: Self-pay

## 2019-01-12 ENCOUNTER — Encounter (HOSPITAL_COMMUNITY): Payer: Self-pay | Admitting: Hematology

## 2019-01-12 ENCOUNTER — Inpatient Hospital Stay (HOSPITAL_BASED_OUTPATIENT_CLINIC_OR_DEPARTMENT_OTHER): Payer: Medicare Other | Admitting: Hematology

## 2019-01-12 ENCOUNTER — Other Ambulatory Visit: Payer: Self-pay

## 2019-01-12 VITALS — BP 134/68 | HR 80 | Temp 97.6°F | Resp 18 | Wt 113.9 lb

## 2019-01-12 DIAGNOSIS — Z7982 Long term (current) use of aspirin: Secondary | ICD-10-CM

## 2019-01-12 DIAGNOSIS — I2510 Atherosclerotic heart disease of native coronary artery without angina pectoris: Secondary | ICD-10-CM

## 2019-01-12 DIAGNOSIS — D649 Anemia, unspecified: Secondary | ICD-10-CM

## 2019-01-12 DIAGNOSIS — R5383 Other fatigue: Secondary | ICD-10-CM | POA: Diagnosis not present

## 2019-01-12 DIAGNOSIS — R200 Anesthesia of skin: Secondary | ICD-10-CM

## 2019-01-12 DIAGNOSIS — R0609 Other forms of dyspnea: Secondary | ICD-10-CM | POA: Diagnosis not present

## 2019-01-12 DIAGNOSIS — G4733 Obstructive sleep apnea (adult) (pediatric): Secondary | ICD-10-CM

## 2019-01-12 DIAGNOSIS — E785 Hyperlipidemia, unspecified: Secondary | ICD-10-CM

## 2019-01-12 DIAGNOSIS — Z86000 Personal history of in-situ neoplasm of breast: Secondary | ICD-10-CM

## 2019-01-12 DIAGNOSIS — D696 Thrombocytopenia, unspecified: Secondary | ICD-10-CM

## 2019-01-12 DIAGNOSIS — R600 Localized edema: Secondary | ICD-10-CM

## 2019-01-12 DIAGNOSIS — Z79899 Other long term (current) drug therapy: Secondary | ICD-10-CM

## 2019-01-12 DIAGNOSIS — D509 Iron deficiency anemia, unspecified: Secondary | ICD-10-CM

## 2019-01-12 DIAGNOSIS — Z9012 Acquired absence of left breast and nipple: Secondary | ICD-10-CM

## 2019-01-12 DIAGNOSIS — M810 Age-related osteoporosis without current pathological fracture: Secondary | ICD-10-CM

## 2019-01-12 DIAGNOSIS — Z85528 Personal history of other malignant neoplasm of kidney: Secondary | ICD-10-CM

## 2019-01-12 DIAGNOSIS — E1122 Type 2 diabetes mellitus with diabetic chronic kidney disease: Secondary | ICD-10-CM

## 2019-01-12 DIAGNOSIS — I130 Hypertensive heart and chronic kidney disease with heart failure and stage 1 through stage 4 chronic kidney disease, or unspecified chronic kidney disease: Secondary | ICD-10-CM

## 2019-01-12 DIAGNOSIS — M1990 Unspecified osteoarthritis, unspecified site: Secondary | ICD-10-CM

## 2019-01-12 DIAGNOSIS — Z7951 Long term (current) use of inhaled steroids: Secondary | ICD-10-CM

## 2019-01-12 DIAGNOSIS — Z8571 Personal history of Hodgkin lymphoma: Secondary | ICD-10-CM

## 2019-01-12 DIAGNOSIS — I480 Paroxysmal atrial fibrillation: Secondary | ICD-10-CM

## 2019-01-12 DIAGNOSIS — N182 Chronic kidney disease, stage 2 (mild): Secondary | ICD-10-CM

## 2019-01-12 DIAGNOSIS — I5032 Chronic diastolic (congestive) heart failure: Secondary | ICD-10-CM

## 2019-01-12 DIAGNOSIS — Z951 Presence of aortocoronary bypass graft: Secondary | ICD-10-CM

## 2019-01-12 MED ORDER — HEMATINIC PLUS VIT/MINERALS 106-1 MG PO TABS: ORAL_TABLET | ORAL | 0 refills | Status: DC

## 2019-01-12 NOTE — Patient Instructions (Signed)
[facility] at [facility] Discharge Instructions  Follow up in 3 months with labs    Thank you for choosing [facility] at [facility] to provide your oncology and hematology care.  To afford each patient quality time with our provider, please arrive at least 15 minutes before your scheduled appointment time.   If you have a lab appointment with the Cancer Center please come in thru the  Main Entrance and check in at the main information desk  You need to re-schedule your appointment should you arrive 10 or more minutes late.  We strive to give you quality time with our providers, and arriving late affects you and other patients whose appointments are after yours.  Also, if you no show three or more times for appointments you may be dismissed from the clinic at the providers discretion.     Again, thank you for choosing [facility].  Our hope is that these requests will decrease the amount of time that you wait before being seen by our physicians.       _____________________________________________________________  Should you have questions after your visit to  Cancer Center, please contact our office at [phone] between the hours of 8:00 a.m. and 4:30 p.m.  Voicemails left after 4:00 p.m. will not be returned until the following business day.  For prescription refill requests, have your pharmacy contact our office and allow 72 hours.    Cancer Center Support Programs:   > Cancer Support Group  2nd Tuesday of the month 1pm-2pm, Journey Room    

## 2019-01-12 NOTE — Progress Notes (Signed)
[name] [name], [place] [address]   CLINIC:  Medical Oncology/Hematology  PCP:  [name], [place] [address] [phone]   REASON FOR VISIT: Follow-up for thrombocytopenia and mild hypercalcemia.  CURRENT THERAPY: observation   INTERVAL HISTORY:  [name] [name] 68 y.o. female returns for routine follow-up for thrombocytopenia and mild hypercalcemia. She reports she is doing well since her last visit. Denies any nausea, vomiting, or diarrhea. Denies any new pains. Had not noticed any recent bleeding such as epistaxis, hematuria or hematochezia. Denies recent chest pain on exertion, shortness of breath on minimal exertion, pre-syncopal episodes, or palpitations. Denies any numbness or tingling in hands or feet. Denies any recent fevers, infections, or recent hospitalizations. Patient reports appetite at 100% and energy level at 50%.She is eating well and maintaining her weight at this time.     REVIEW OF SYSTEMS:  Review of Systems  Constitutional: Positive for fatigue.  Respiratory: Positive for shortness of breath.   Cardiovascular: Positive for leg swelling.  Neurological: Positive for numbness.  All other systems reviewed and are negative.    PAST MEDICAL/SURGICAL HISTORY:  Past Medical History:  Diagnosis Date  . Anxiety   . Aortic stenosis    a. bicuspid aortic valve; mean gradient of 20 mmHg in 2/10; b. s/p bioprosth AVR (19-mm Edwards pericardial valve) with a redo coronary artery bypass graft procedure in 07/2009; postoperative Dressler's syndrome    . Arthritis   . Breast cancer ([place])   . Carcinoma, renal cell ([place]) 05/2007   Laparoscopic right nephrectomy  . 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 Chronic diastolic CHF (congestive heart failure) ([place])    a. 9.2014 EchoP EF 60-65%, no rwma, bioprosth AVR, mean gradient of 15mHg, mild to mod MR, PASP 535mg.  . CKD (chronic kidney disease), stage  II   . Colitis, ischemic ([facility]2008  . Coronary artery disease    a. initial CABG with SVG-RCA in 12/05. b. redo SVG-RCA and bioprosthetic AVR in 1/11. d. 9/14 - PCI with DES to distal LM/proximal LAD was done rather than bypass as she would have been a poor candidate for redo sternotomy. d. S/p DES to prox LM 05/2014.  . Diabetes mellitus 03/2010    TYPE II: Hemoglobin A1c of 7.4 in  03/2010; 8.4 in 06/2010; treated with insulin pump  . Duodenal ulcer    Remote; H. pylori positive  . Gastroparesis due to DM ([facility]CO[place]10/04/2012  . Genetic testing 01/14/2017   Ms. Koskela underwent genetic counseling and testing for hereditary cancer syndromes on 01/01/2017. Her results were negative for mutations in all 83 genes analyzed by [facility]'s 83-gene Common Hereditary Cancers Panel. Genes analyzed include: ALK, APC, ATM, AXIN2, BAP1, BARD1, BLM, BMPR1A, BRCA1, BRCA2, BRIP1, CASR, CDC73, CDH1, CDK4, CDKN1B, CDKN1C, CDKN2A, CEBPA, CHEK2, CTNNA1, DICER1, DIS3L2,   . Hodgkin's disease ([facility]1991   Mantle radiation therapy  . Hodgkin's disease (H[facility] [place]  . Hyperlipidemia   . Hyperplastic gastric polyp 06/23/2012  . Hypertension   . Iron deficiency anemia    a. 03/2013 EGD: essentially normal. Possible slow GIB.  . Migraines   . NASH (nonalcoholic steatohepatitis) 1999   -biopsy in 1999  . OSA on CPAP 04/24/2017   Moderate with AHI 22/hr now on CPAP at 18cm H2o  . Osteopenia     hip on DEXA in October 2007.  . Osteoporosis   . Oxygen deficiency   . Pacemaker  six sinus syndrome  . PAF (paroxysmal atrial fibrillation) ([place])    a.  h/o brief episodes noted on ppm interrogation. b. given GI bleeding and need to be on both ASA 81 and Brilinta, risks likely outweigh benefits of anticoagulation.   . Renal insufficiency   . Small bowel obstruction ([facility])    Recurrent; resolved after resection of a lipoma  . Syncope    a. H/o recurrent syncope with pauses on loop recorder s/p Medtronic pacemaker 07/2012.   Past  Surgical History:  Procedure Laterality Date  . ABDOMINAL HYSTERECTOMY    . AORTIC VALVE REPLACEMENT  2011   Aortic valve replacement surgery with a 19 mm bioprosthetic device post redo CABG January 2011.  Marland Kitchen BONE MARROW BIOPSY    . BREAST EXCISIONAL BIOPSY     benign, bilateral  . CARDIAC CATHETERIZATION    . CERVICAL BIOPSY    . COLONOSCOPY     Multiple with adenomatous polyps  . COLONOSCOPY  06/23/2012   Procedure: COLONOSCOPY;  Surgeon: Gatha Mayer, MD;  Location: [facility] ENDOSCOPY;  Service: Endoscopy;  Laterality: N/A;  . CORONARY ARTERY BYPASS GRAFT  2005, 2011   CABG-most recent SVG to RCA-12/05; RCA occluded with patent graft in 2006; Redo bypass in 07/2009  . ESOPHAGOGASTRODUODENOSCOPY    . ESOPHAGOGASTRODUODENOSCOPY  06/23/2012   Procedure: ESOPHAGOGASTRODUODENOSCOPY (EGD);  Surgeon: Gatha Mayer, MD;  Location: [facility];  Service: Endoscopy;  Laterality: N/A;  . ESOPHAGOGASTRODUODENOSCOPY N/A 04/06/2013   Procedure: ESOPHAGOGASTRODUODENOSCOPY (EGD);  Surgeon: Irene Shipper, MD;  Location: [facility] ENDOSCOPY;  Service: Endoscopy;  Laterality: N/A;  . EXPLORATORY LAPAROTOMY W/ BOWEL RESECTION     Small bowel resection of lipoma  . GIVENS CAPSULE STUDY N/A 04/30/2013   Procedure: GIVENS CAPSULE STUDY;  Surgeon: Gatha Mayer, MD;  Location: [facility];  Service: Endoscopy;  Laterality: N/A;  . LEFT AND RIGHT HEART CATHETERIZATION WITH CORONARY ANGIOGRAM N/A 04/07/2013   Procedure: LEFT AND RIGHT HEART CATHETERIZATION WITH CORONARY ANGIOGRAM;  Surgeon: Larey Dresser, MD;  Location: [facility] CATH LAB;  Service: Cardiovascular;  Laterality: N/A;  . LEFT ATRIAL APPENDAGE OCCLUSION N/A 02/16/2016   Watchman not placed - nickel allergy  . LEFT HEART CATHETERIZATION WITH CORONARY/GRAFT ANGIOGRAM N/A 06/07/2014   Procedure: LEFT HEART CATHETERIZATION WITH Beatrix Fetters;  Surgeon: Burnell Blanks, MD;  Location: [facility] CATH LAB;  Service: Cardiovascular;  Laterality: N/A;  . LIVER  BIOPSY    . LOOP RECORDER IMPLANT N/A 12/10/2011   Procedure: LOOP RECORDER IMPLANT;  Surgeon: Evans Lance, MD;  Location: [facility] CATH LAB;  Service: Cardiovascular;  Laterality: N/A;  . MALONEY DILATION  06/23/2012   Procedure: [name] DILATION;  Surgeon: Gatha Mayer, MD;  Location: [facility] ENDOSCOPY;  Service: Endoscopy;;  54 fr  . MASTECTOMY Left 05/17/2015   total   . NEPHRECTOMY  2008   Laparoscopic right nephrectomy, renal cell carcinoma  . PACEMAKER INSERTION  08/21/2012   Medtronic Adapta L dual-chamber pacemaker  . PERCUTANEOUS CORONARY STENT INTERVENTION (PCI-S) N/A 04/09/2013   Procedure: PERCUTANEOUS CORONARY STENT INTERVENTION (PCI-S);  Surgeon: Burnell Blanks, MD;  Location: [facility] CATH LAB;  Service: Cardiovascular;  Laterality: N/A;  . PERMANENT PACEMAKER INSERTION N/A 08/21/2012   Procedure: PERMANENT PACEMAKER INSERTION;  Surgeon: Evans Lance, MD;  [facility] C[facility] LAB;  Service: Cardiovascular;  Laterality: N/A;  . RIGHT HEART CATH AND CORONARY/GRAFT ANGIOGRAPHY N/A 04/19/2017   Procedure: RIGHT HEART CATH AND CORONARY/GRAFT ANGIOGRAPHY;  Surgeon: Larey Dresser, MD;  Location:  [facility] INVASIVE CV LAB;  Service: Cardiovascular;  Laterality: N/A;  . TEE WITHOUT CARDIOVERSION N/A 12/07/2015   Procedure: TRANSESOPHAGEAL ECHOCARDIOGRAM (TEE);  Surgeon: Larey Dresser, MD;  Location: [place] [facility];  Service: Cardiovascular;  Laterality: N/A;  . TOTAL ABDOMINAL HYSTERECTOMY W/ BILATERAL SALPINGOOPHORECTOMY  2000   endometriosis and ovarian cysts  . TOTAL MASTECTOMY Left 05/17/2015   Procedure: LEFT TOTAL MASTECTOMY;  Surgeon: Rolm Bookbinder, MD;  Location: [facility];  Service: General;  Laterality: Left;  . TUMOR EXCISION  1981   removal of Hodgkins lymphoma     SOCIAL HISTORY:  Social History   Socioeconomic History  . Marital status: Married    Spouse name: Not on file  . Number of children: 2  . Years of education: Not on file  . Highest education level: Not on file   Occupational History  . Occupation: retired    Comment: elementary principal  Social Needs  . Financial resource strain: Not on file  . Food insecurity    Worry: Not on file    Inability: Not on file  . Transportation needs    Medical: Not on file    Non-medical: Not on file  Tobacco Use  . Smoking status: Never Smoker  . Smokeless tobacco: Never Used  Substance and Sexual Activity  . Alcohol use: No    Alcohol/week: 0.0 standard drinks  . Drug use: No  . Sexual activity: Yes  Lifestyle  . Physical activity    Days per week: Not on file    Minutes per session: Not on file  . Stress: Not on file  Relationships  . Social Herbalist on phone: Not on file    Gets together: Not on file    Attends religious service: Not on file    Active member of club or organization: Not on file    Attends meetings of clubs or organizations: Not on file    Relationship status: Not on file  . Intimate partner violence    Fear of current or ex partner: Not on file    Emotionally abused: Not on file    Physically abused: Not on file    Forced sexual activity: Not on file  Other Topics Concern  . Not on file  Social History Narrative   Retired [employer] principal, married    Started disability September 2013      Has one daughter in [place], works at [employer]. Has a son, Aaron Edelman, who is Retail buyer.   Eats all food groups.   Wear seatbelt.    Attends church. [place], [place]. [employer].     FAMILY HISTORY:  Family History  Problem Relation Age of Onset  . Heart attack Mother        d.61  . Heart disease Mother   . Heart attack Father        d.60  . Diabetes Father   . Heart disease Father   . Lung cancer Sister        d.61 metastases to brain. History of smoking. Maternal half-sister.  . Cancer Sister 30       unspecified gynecologic cancer (either cervical or ovarian) in her 44s.  Marland Kitchen Heart attack Maternal Grandmother   . Cancer Maternal Grandfather 81       d.52s  oral cancer  . Hypertension Brother   . Glaucoma Brother   . Breast cancer Sister 96       d.42 from metastatic disease. Paternal half-sister.  Marland Kitchen  Colon cancer Neg Hx   . Stomach cancer Neg Hx     CURRENT MEDICATIONS:  Outpatient Encounter Medications as of 01/12/2019  Medication Sig  . ALPRAZolam (XANAX) 0.5 MG tablet Take 1 tablet (0.5 mg total) by mouth 2 (two) times daily as needed for anxiety. (Patient taking differently: Take 0.5 mg by mouth 2 (two) times daily as needed for anxiety. Taken one at night)  . amLODipine (NORVASC) 2.5 MG tablet TAKE ONE TABLET (2.5MG TOTAL) BY MOUTH DAILY  . APIDRA 100 UNIT/ML injection Inject 46 Units into the skin as directed. INFUSE THROUGH INSULIN PUMP UTD::[place] 46.15 units daily, Bolus depends on carb intake  . aspirin EC 81 MG tablet Take 81 mg by mouth at bedtime.  Marland Kitchen BRILINTA 60 MG TABS tablet TAKE ONE TABLET (60 MG TOTAL) BY MOUTH TWICE DAILY.  Marland Kitchen Cholecalciferol 2000 UNITS TABS Take 1 tablet by mouth every morning.   . clobetasol cream (TEMOVATE) 2.44 % Apply 1 application topically 2 (two) times daily as needed.   . Coenzyme Q10 (CO Q 10) 100 MG CAPS Take 1 capsule by mouth at bedtime.   . colchicine 0.6 MG tablet Take 0.6 mg by mouth daily as needed (gout flare).  Marland Kitchen docusate sodium (COLACE) 100 MG capsule Take 100 mg by mouth as needed.   . famotidine (PEPCID) 40 MG tablet Take 40 mg by mouth daily.   . fluticasone (FLONASE) 50 MCG/ACT nasal spray Place 2 sprays into both nostrils daily. (Patient taking differently: Place 2 sprays into both nostrils as needed. )  . glucagon (GLUCAGON EMERGENCY) 1 MG injection Inject 1 mg into the vein once as needed (for severe reaction). Reported on 12/05/2015  . Icosapent Ethyl (VASCEPA) 1 g CAPS Take 2 capsules (2 g total) by mouth 2 (two) times daily.  . isosorbide mononitrate (IMDUR) 120 MG 24 hr tablet TAKE ONE TABLET (120MG TOTAL) BY MOUTH DAILY  . latanoprost (XALATAN) 0.005 % ophthalmic solution Place 1  drop into both eyes at bedtime.  . Magnesium Oxide 250 MG TABS Take 400 mg by mouth at bedtime.   . metolazone (ZAROXOLYN) 2.5 MG tablet Take 2.5 mg (1 tab) once as directed by CHF clinic. Call before taking [phone]  . metoprolol tartrate (LOPRESSOR) 50 MG tablet TAKE ONE AND ONE-HALF TABLETS (75 MG) BYMOUTH TWICE DAILY.  . mupirocin cream (BACTROBAN) 2 % Apply 1 application topically 2 (two) times daily as needed.  . nitroGLYCERIN (NITROLINGUAL) 0.4 MG/SPRAY spray PLACE 1 SPRAY UNDER THE TONGUE EVERY 5 MINUTES FOR 3 DOSES AS NEEDED FOR CHEST PAIN  . Polyethyl Glycol-Propyl Glycol (SYSTANE) 0.4-0.3 % SOLN Apply 1 drop to eye daily as needed (dry eyes).  . PRALUENT 150 MG/ML SOAJ INJECT 150MG SUBCUTANEOUSLY EVERY 2 WEEKS  . pravastatin (PRAVACHOL) 10 MG tablet Take 1 tablet (10 mg total) by mouth daily.  . silver nitrate applicators 44-03 % applicator Use as needed to stop superficial skin bleeding.  Marland Kitchen spironolactone (ALDACTONE) 25 MG tablet Take 0.5 tablets (12.5 mg total) by mouth daily.  Marland Kitchen torsemide (DEMADEX) 20 MG tablet Take 40 mg by mouth daily. Take extra if needed for fluid  . ULORIC 40 MG tablet Take 1 tablet (40 mg total) by mouth at bedtime.  Marland Kitchen UNABLE TO FIND Med Name: So Clean CPAP cleaning system  . [DISCONTINUED] Fe Fum-FA-B Cmp-C-Zn-Mg-Mn-Cu (HEMATINIC PLUS VIT/MINERALS) 106-1 MG TABS TAKE 1 TABLET BY MOUTH THREE TIMES A WEEK(MONDAY, WEDNESDAY, AND FRIDAY) OR AS DIRECTED WITH A VITAMIN "C"  . Multiple  Vitamins-Minerals (MULTIVITAMIN WITH MINERALS) tablet Take 1 tablet by mouth every morning.   . triamcinolone cream (KENALOG) 0.5 % Apply 1 application topically daily as needed (inflamation).    No facility-administered encounter medications on file as of 01/12/2019.     ALLERGIES:  Allergies  Allergen Reactions  . Avandia [Rosiglitazone Maleate] Other (See Comments)    Congestive heart failure  . Cephalexin Anaphylaxis, Swelling and Rash    Extremities swell , throat  swelling   . Clindamycin Hcl Anaphylaxis and Shortness Of Breath  . Lincomycin Anaphylaxis and Shortness Of Breath  . Metformin Other (See Comments)    ? Congestive heart failure ? Congestive heart failure  . Novolog [Insulin Aspart] Hives and Itching    Humalog & Novolog big knots and whelp    . Penicillins Anaphylaxis, Hives, Swelling and Rash    Has patient had a PCN reaction causing immediate rash, facial/tongue/throat swelling, SOB or lightheadedness with hypotension: Yes Has patient had a PCN reaction causing severe rash involving mucus membranes or skin necrosis: No Has patient had a PCN reaction that required hospitalization No Has patient had a PCN reaction occurring within the last 10 years: No If all of the above answers are "NO", then may proceed with Cephalosporin use.   Marland Kitchen Rosiglitazone Other (See Comments)    Congestive heart failure   Congestive heart failure   . Tizanidine Other (See Comments)    Dizziness, Mental Status Changes, Hallucination    . Adhesive [Tape] Other (See Comments)    Tears skin, Please use "paper" tape  . Atorvastatin Other (See Comments)    increased LFT's, no muscle weakness, leg pain Muscle and joint pain increased LFT's  Increased LFTs  . Cholestyramine Other (See Comments)    Liver Disorder Elevated LFTs  Elevated LFT's   . Gentamycin [Gentamicin] Hives, Itching and Rash    Fine red bumps.  Reaction noted post loop recorder implant, where gentamycin was used for irrigation. Topical prep  . Humalog [Insulin Lispro] Hives, Itching and Other (See Comments)    Pt uses Apidra   . Iodinated Diagnostic Agents Other (See Comments)    PAIN DURING LYMPHANGIOGRAM '81, NO ALLERGY TO IV CONTRAST. PAIN DURING LYMPHANGIOGRAM '86, NO ALLERGY TO IV CONTRAST PAIN DURING LYMPHANGIOGRAM '81, NO ALLERGY TO IV CONTRAST.Has had procedures with Iodine since without problems. PAIN DURING LYMPHANGIOGRAM '86, NO ALLERGY TO IV CONTRAST PAIN DURING  LYMPHANGIOGRAM '81, NO ALLERGY TO IV CONTRAST. Has had procedures with Iodine since without problems. PAIN DURING LYMPHANGIOGRAM '86, NO ALLERGY TO IV CONTRAST  . Limonene Hives, Itching, Rash and Other (See Comments)    Reaction noted post loop recorder implant, where gentamycin was used for irrigation. Topical prep  . Pravastatin Other (See Comments)    In high doses (20-55m) causes muscle and joint pain  Per pt. "can only take in low doses".   . Prednisone Itching and Other (See Comments)    B/P went high, itching all over   . Ranexa [Ranolazine] Other (See Comments)    Nausea, dizziness, low blood sugar, tingling in right hand, "blood blisters" on tongue   . Rosuvastatin Nausea And Vomiting, Nausea Only and Other (See Comments)    Muscle and joint pain & increased LFTs; tolerates Pravastatin 145m(max)  . Simvastatin Other (See Comments)    Increased LFT's Increased LFTs  . Strawberry Extract Hives, Itching, Rash and Other (See Comments)    Reaction noted post loop recorder implant, where gentamycin was used for irrigation. Topical  prep  . Clopidogrel Other (See Comments)    Does not work for patient Medicine was not effective  . Doxycycline     Blisters in mouth  . Plavix [Clopidogrel Bisulfate] Other (See Comments)    P2Y12 testing = 271 while on Plavix  . Codeine Nausea And Vomiting  . Erythromycin Nausea And Vomiting  . Hydrocodone-Acetaminophen Itching, Rash and Other (See Comments)    itching  . Latex Itching and Other (See Comments)    I"if wear gloves hands get red ,itchy no prioblem if other wear and touch me"  . Neomycin Rash and Other (See Comments)    Redness  . Nickel Itching  . Ranitidine Nausea Only  . Tamiflu [Oseltamivir] Nausea And Vomiting  . Tramadol Nausea Only     PHYSICAL EXAM:  ECOG Performance status: 1  Vitals:   01/12/19 1544  BP: 134/68  Pulse: 80  Resp: 18  Temp: 97.6 F (36.4 C)  SpO2: 99%   Filed Weights   01/12/19 1544   Weight: 113 lb 14.4 oz (51.7 kg)    Physical Exam Constitutional:      Appearance: Normal appearance. She is normal weight.  Cardiovascular:     Rate and Rhythm: Normal rate and regular rhythm.     Heart sounds: Normal heart sounds.  Pulmonary:     Effort: Pulmonary effort is normal.     Breath sounds: Normal breath sounds.  Abdominal:     General: Bowel sounds are normal.     Palpations: Abdomen is soft.  Musculoskeletal: Normal range of motion.  Skin:    General: Skin is warm and dry.  Neurological:     Mental Status: She is alert and oriented to person, place, and time. Mental status is at baseline.  Psychiatric:        Mood and Affect: Mood normal.        Behavior: Behavior normal.        Thought Content: Thought content normal.        Judgment: Judgment normal.      LABORATORY DATA:  I have reviewed the labs as listed.  CBC    Component Value Date/Time   WBC 8.2 12/31/2018 1306   RBC 4.11 12/31/2018 1306   HGB 12.4 12/31/2018 1306   HGB 13.4 12/13/2016 1427   HCT 38.1 12/31/2018 1306   HCT 40.3 12/13/2016 1427   PLT 119 (L) 12/31/2018 1306   PLT 107 (L) 12/13/2016 1427   MCV 92.7 12/31/2018 1306   MCV 88.8 12/13/2016 1427   MCH 30.2 12/31/2018 1306   MCHC 32.5 12/31/2018 1306   RDW 13.9 12/31/2018 1306   RDW 14.6 (H) 12/13/2016 1427   LYMPHSABS 1.3 12/31/2018 1306   LYMPHSABS 1.2 12/13/2016 1427   MONOABS 0.6 12/31/2018 1306   MONOABS 0.8 12/13/2016 1427   EOSABS 0.1 12/31/2018 1306   EOSABS 0.2 12/13/2016 1427   BASOSABS 0.1 12/31/2018 1306   BASOSABS 0.1 12/13/2016 1427   CMP Latest Ref Rng & Units 12/31/2018 12/31/2018 12/24/2018  Glucose 70 - 99 mg/dL 93 - 226(H)  BUN 8 - 23 mg/dL 45(H) - 53(H)  Creatinine 0.44 - 1.00 mg/dL 1.47(H) - 1.82(H)  Sodium 135 - 145 mmol/L 141 - 141  Potassium 3.5 - 5.1 mmol/L 4.7 - 4.6  Chloride 98 - 111 mmol/L 103 - 101  CO2 22 - 32 mmol/L 27 - 23  Calcium 8.7 - 10.3 mg/dL 10.5(H) 10.5(H) 10.3  Total Protein 6.5 -  8.1 g/dL 8.8(H) -  8.3(H)  Total Bilirubin 0.3 - 1.2 mg/dL 0.7 - 0.8  Alkaline Phos 38 - 126 U/L 124 - 140(H)  AST 15 - 41 U/L 26 - 24  ALT 0 - 44 U/L 23 - 21       DIAGNOSTIC IMAGING:  I have independently reviewed the scans and discussed with the patient.   I have reviewed Francene Finders, NP's note and agree with the documentation.  I personally performed a face-to-face visit, made revisions and my assessment and plan is as follows.    ASSESSMENT & PLAN:   Thrombocytopenia ([place]) 1.  Mild hypercalcemia: - She is not on any calcium supplements since #2009.  She takes vitamin D3 2000 units daily. - Intact PTH was 19.  Calcium was 10.5.  This is consistent with non-hyperparathyroid hypercalcemia. - Last vitamin D level was 75.  Calcitriol was 16.5.  PTH RP was less than 2. - SPEP, serum immunofixation were negative.  24-hour urine for free light chains and immunofixation was negative.  Free light chain ratio is mildly elevated secondary to her CKD.  (Free kappa light chains are 64, lambda light chains 37.2 and ratio of 1.73).  Given her prior malignancies, we have done a bone scan which was negative. - No further work-up necessary.  Mild hypercalcemia likely from vitamin D intake.  I have also reviewed all her medications.  She is on torsemide and spironolactone for CHF. -We will plan to repeat her calcium level and myeloma labs in 3 months.  2.  Mild thrombocytopenia: - Previous CT disorder work-up was negative.  CT abdomen did not show any splenomegaly.  Peripheral blood smear showed giant platelets.  This was clinically consistent with immune mediated process. -She does not have any bleeding problems.  She does have easy bruising as she is on Brilinta. -We will continue to monitor platelets are 6 monthly intervals.  3. osteoporosis: -Last DEXA scan on 12/25/2017 shows T score of -3.2. - She is taking vitamin D 2000 units. - She [facility].  She has  not been started on bisphosphonate.  Apparently there was a discussion about starting her on Prolia.  4.  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  She is not on tamoxifen therapy. -Last mammogram in December 2019 of the right breast was benign.  5.  Stage IIb Hodgkin's disease: -She received mantle radiation in 1981.  She continues to be in remission since then. -She does not have any B symptoms or palpable adenopathy.  6.  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 - Last CT on 04/14/2018 shows stable 14 mm left renal lesion.   Total time spent is 25 minutes with more than 50% of the time spent face-to-face discussing test results, counseling and coordination of care.  Orders placed this encounter:  Orders Placed This Encounter  Procedures  . CBC with Differential/Platelet  . Comprehensive metabolic panel  . VITAMIN D 25 Hydroxy (Vit-D Deficiency, Fractures)  . Lactate dehydrogenase  . Protein electrophoresis, serum  . Immunofixation electrophoresis  . Kappa/lambda light chains      Derek Jack, [place] [phone]

## 2019-01-12 NOTE — Assessment & Plan Note (Signed)
1.  Mild hypercalcemia: - She is not on any calcium supplements since #2009.  She takes vitamin D3 2000 units daily. - Intact PTH was 19.  Calcium was 10.5.  This is consistent with non-hyperparathyroid hypercalcemia. - Last vitamin D level was 75.  Calcitriol was 16.5.  PTH RP was less than 2. - SPEP, serum immunofixation were negative.  24-hour urine for free light chains and immunofixation was negative.  Free light chain ratio is mildly elevated secondary to her CKD.  (Free kappa light chains are 64, lambda light chains 37.2 and ratio of 1.73).  Given her prior malignancies, we have done a bone scan which was negative. - No further work-up necessary.  Mild hypercalcemia likely from vitamin D intake.  I have also reviewed all her medications.  She is on torsemide and spironolactone for CHF. -We will plan to repeat her calcium level and myeloma labs in 3 months.  2.  Mild thrombocytopenia: - Previous CT disorder work-up was negative.  CT abdomen did not show any splenomegaly.  Peripheral blood smear showed giant platelets.  This was clinically consistent with immune mediated process. -She does not have any bleeding problems.  She does have easy bruising as she is on Brilinta. -We will continue to monitor platelets are 6 monthly intervals.  3. osteoporosis: -Last DEXA scan on 12/25/2017 shows T score of -3.2. - She is taking vitamin D 2000 units. - She sees an endocrinologist at [facility].  She has not been started on bisphosphonate.  Apparently there was a discussion about starting her on Prolia.  4.  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  She is not on tamoxifen therapy. -Last mammogram in December 2019 of the right breast was benign.  5.  Stage IIb Hodgkin's disease: -She received mantle radiation in 1981.  She continues to be in remission since then. -She does not have any B symptoms or palpable adenopathy.  6.  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 - Last CT on 04/14/2018 shows stable 14 mm left renal lesion.

## 2019-01-27 ENCOUNTER — Encounter (HOSPITAL_COMMUNITY): Payer: Self-pay

## 2019-02-06 ENCOUNTER — Other Ambulatory Visit (HOSPITAL_COMMUNITY): Payer: Self-pay | Admitting: Cardiology

## 2019-02-11 ENCOUNTER — Other Ambulatory Visit: Payer: Self-pay

## 2019-02-11 ENCOUNTER — Ambulatory Visit (INDEPENDENT_AMBULATORY_CARE_PROVIDER_SITE_OTHER): Payer: Medicare Other | Admitting: Internal Medicine

## 2019-02-11 ENCOUNTER — Encounter: Payer: Self-pay | Admitting: Internal Medicine

## 2019-02-11 VITALS — BP 118/64 | HR 74 | Temp 97.6°F | Ht 62.0 in

## 2019-02-11 DIAGNOSIS — Z950 Presence of cardiac pacemaker: Secondary | ICD-10-CM

## 2019-02-11 DIAGNOSIS — I495 Sick sinus syndrome: Secondary | ICD-10-CM | POA: Diagnosis not present

## 2019-02-11 DIAGNOSIS — I480 Paroxysmal atrial fibrillation: Secondary | ICD-10-CM | POA: Diagnosis not present

## 2019-02-11 LAB — CUP PACEART INCLINIC DEVICE CHECK
Battery Impedance: 332 Ohm
Battery Remaining Longevity: 127 mo
Battery Voltage: 2.81 V
Brady Statistic AP VP Percent: 0.1 %
Brady Statistic AP VS Percent: 0.5 %
Brady Statistic AS VP Percent: 0.3 %
Brady Statistic AS VS Percent: 99.2 %
Date Time Interrogation Session: 20200722121354
Implantable Lead Implant Date: 20140130
Implantable Lead Implant Date: 20140130
Implantable Lead Location: 753859
Implantable Lead Location: 753860
Implantable Lead Model: 5076
Implantable Lead Model: 5076
Implantable Pulse Generator Implant Date: 20140130
Lead Channel Impedance Value: 423 Ohm
Lead Channel Impedance Value: 557 Ohm
Lead Channel Pacing Threshold Amplitude: 0.5 V
Lead Channel Pacing Threshold Amplitude: 0.75 V
Lead Channel Pacing Threshold Pulse Width: 0.4 ms
Lead Channel Pacing Threshold Pulse Width: 0.4 ms
Lead Channel Sensing Intrinsic Amplitude: 2.8 mV
Lead Channel Sensing Intrinsic Amplitude: 22.4 mV
Lead Channel Setting Pacing Amplitude: 1.5 V
Lead Channel Setting Pacing Amplitude: 2.5 V
Lead Channel Setting Pacing Pulse Width: 0.4 ms
Lead Channel Setting Sensing Sensitivity: 5.6 mV

## 2019-02-11 NOTE — Patient Instructions (Signed)
Medication Instructions:  Your physician recommends that you continue on your current medications as directed. Please refer to the Current Medication list given to you today.  If you need a refill on your cardiac medications before your next appointment, please call your pharmacy.   Lab work: NONE  If you have labs (blood work) drawn today and your tests are completely normal, you will receive your results only by: Marland Kitchen MyChart Message (if you have MyChart) OR . A paper copy in the mail If you have any lab test that is abnormal or we need to change your treatment, we will call you to review the results.  Testing/Procedures: NONE   Follow-Up: At [facility], you and your health needs are our priority.  As part of our continuing mission to provide you with exceptional heart care, we have created designated Provider Care Teams.  These Care Teams include your primary Cardiologist (physician) and Advanced Practice Providers (APPs -  Physician Assistants and Nurse Practitioners) who all work together to provide you with the care you need, when you need it. You will need a follow up appointment in 1 years.  Please call our office 2 months in advance to schedule this appointment.  You may see [name], MD or one of the following Advanced Practice Providers on your designated Care Team:   Chanetta Marshall, NP . Tommye Standard, PA-C Remote monitoring is used to monitor your Pacemaker of ICD from home. This monitoring reduces the number of office visits required to check your device to one time per year. It allows [name] to keep an eye on the functioning of your device to ensure it is working properly. You are scheduled for a device check from home on 03/02/19. You may send your transmission at any time that day. If you have a wireless device, the transmission will be sent automatically. After your physician reviews your transmission, you will receive a postcard with your next transmission date.   Any Other  Special Instructions Will Be Listed Below (If Applicable). Thank you for choosing [facility]!

## 2019-02-11 NOTE — Progress Notes (Signed)
    HPI [name] [name] returns today for PPM followup. She has a h/o CHB, CAD, AS, s/p AVR, and PAF. She has had some worsening in her symptoms from PAF. She denies chest pain or sob although when she goes out of rhythm she feels bad. No syncope. She does have some dizziness. Her main complaint today involves bleeding from her SQ access site while taking Brilinta.  Allergies  Allergen Reactions  . Avandia [Rosiglitazone Maleate] Other (See Comments)    Congestive heart failure  . Cephalexin Anaphylaxis, Swelling and Rash    Extremities swell , throat swelling   . Clindamycin Hcl Anaphylaxis and Shortness Of Breath  . Lincomycin Anaphylaxis and Shortness Of Breath  . Metformin Other (See Comments)    ? Congestive heart failure ? Congestive heart failure  . Novolog [Insulin Aspart] Hives and Itching    Humalog & Novolog big knots and whelp    . Penicillins Anaphylaxis, Hives, Swelling and Rash    Has patient had a PCN reaction causing immediate rash, facial/tongue/throat swelling, SOB or lightheadedness with hypotension: Yes Has patient had a PCN reaction causing severe rash involving mucus membranes or skin necrosis: No Has patient had a PCN reaction that required hospitalization No Has patient had a PCN reaction occurring within the last 10 years: No If all of the above answers are "NO", then may proceed with Cephalosporin use.   . Rosiglitazone Other (See Comments)    Congestive heart failure   Congestive heart failure   . Tizanidine Other (See Comments)    Dizziness, Mental Status Changes, Hallucination    . Adhesive [Tape] Other (See Comments)    Tears skin, Please use "paper" tape  . Atorvastatin Other (See Comments)    increased LFT's, no muscle weakness, leg pain Muscle and joint pain increased LFT's  Increased LFTs  . Cholestyramine Other (See Comments)    Liver Disorder Elevated LFTs  Elevated LFT's   . Gentamycin [Gentamicin] Hives, Itching and Rash    Fine  red bumps.  Reaction noted post loop recorder implant, where gentamycin was used for irrigation. Topical prep  . Humalog [Insulin Lispro] Hives, Itching and Other (See Comments)    Pt uses Apidra   . Iodinated Diagnostic Agents Other (See Comments)    PAIN DURING LYMPHANGIOGRAM '81, NO ALLERGY TO IV CONTRAST. PAIN DURING LYMPHANGIOGRAM '86, NO ALLERGY TO IV CONTRAST PAIN DURING LYMPHANGIOGRAM '81, NO ALLERGY TO IV CONTRAST.Has had procedures with Iodine since without problems. PAIN DURING LYMPHANGIOGRAM '86, NO ALLERGY TO IV CONTRAST PAIN DURING LYMPHANGIOGRAM '81, NO ALLERGY TO IV CONTRAST. Has had procedures with Iodine since without problems. PAIN DURING LYMPHANGIOGRAM '86, NO ALLERGY TO IV CONTRAST  . Limonene Hives, Itching, Rash and Other (See Comments)    Reaction noted post loop recorder implant, where gentamycin was used for irrigation. Topical prep  . Pravastatin Other (See Comments)    In high doses (20-40mg) causes muscle and joint pain  Per pt. "can only take in low doses".   . Prednisone Itching and Other (See Comments)    B/P went high, itching all over   . Ranexa [Ranolazine] Other (See Comments)    Nausea, dizziness, low blood sugar, tingling in right hand, "blood blisters" on tongue   . Rosuvastatin Nausea And Vomiting, Nausea Only and Other (See Comments)    Muscle and joint pain & increased LFTs; tolerates Pravastatin 10mg (max)  . Simvastatin Other (See Comments)    Increased LFT's Increased LFTs  . Strawberry   Extract Hives, Itching, Rash and Other (See Comments)    Reaction noted post loop recorder implant, where gentamycin was used for irrigation. Topical prep  . Clopidogrel Other (See Comments)    Does not work for patient Medicine was not effective  . Doxycycline     Blisters in mouth  . Plavix [Clopidogrel Bisulfate] Other (See Comments)    P2Y12 testing = 271 while on Plavix  . Codeine Nausea And Vomiting  . Erythromycin Nausea And Vomiting  .  Hydrocodone-Acetaminophen Itching, Rash and Other (See Comments)    itching  . Latex Itching and Other (See Comments)    I"if wear gloves hands get red ,itchy no prioblem if other wear and touch me"  . Neomycin Rash and Other (See Comments)    Redness  . Nickel Itching  . Ranitidine Nausea Only  . Tamiflu [Oseltamivir] Nausea And Vomiting  . Tramadol Nausea Only     Current Outpatient Medications  Medication Sig Dispense Refill  . ALPRAZolam (XANAX) 0.5 MG tablet Take 1 tablet (0.5 mg total) by mouth 2 (two) times daily as needed for anxiety. (Patient taking differently: Take 0.5 mg by mouth 2 (two) times daily as needed for anxiety. Taken one at night) 60 tablet 1  . amLODipine (NORVASC) 2.5 MG tablet TAKE ONE TABLET (2.5MG TOTAL) BY MOUTH DAILY 90 tablet 1  . APIDRA 100 UNIT/ML injection Inject 46 Units into the skin as directed. INFUSE THROUGH INSULIN PUMP UTD::[place] 46.15 units daily, Bolus depends on carb intake  2  . aspirin EC 81 MG tablet Take 81 mg by mouth at bedtime.    . BRILINTA 60 MG TABS tablet TAKE ONE TABLET (60 MG TOTAL) BY MOUTH TWICE DAILY. 180 tablet 2  . Cholecalciferol 2000 UNITS TABS Take 1 tablet by mouth every morning.     . clobetasol cream (TEMOVATE) 0.05 % Apply 1 application topically 2 (two) times daily as needed.     . Coenzyme Q10 (CO Q 10) 100 MG CAPS Take 1 capsule by mouth at bedtime.     . colchicine 0.6 MG tablet Take 0.6 mg by mouth daily as needed (gout flare).    . docusate sodium (COLACE) 100 MG capsule Take 100 mg by mouth as needed.     . famotidine (PEPCID) 40 MG tablet Take 40 mg by mouth daily.     . Fe Fum-FA-B Cmp-C-Zn-Mg-Mn-Cu (HEMATINIC PLUS VIT/MINERALS) 106-1 MG TABS TAKE 1 TABLET BY MOUTH THREE TIMES A WEEK(MONDAY, WEDNESDAY, AND FRIDAY) OR AS DIRECTED WITH A VITAMIN "C" 90 tablet 0  . fluticasone (FLONASE) 50 MCG/ACT nasal spray Place 2 sprays into both nostrils daily. (Patient taking differently: Place 2 sprays into both nostrils as  needed. ) 48 g 3  . glucagon (GLUCAGON EMERGENCY) 1 MG injection Inject 1 mg into the vein once as needed (for severe reaction). Reported on 12/05/2015    . isosorbide mononitrate (IMDUR) 120 MG 24 hr tablet TAKE ONE TABLET (120MG TOTAL) BY MOUTH DAILY 90 tablet 3  . latanoprost (XALATAN) 0.005 % ophthalmic solution Place 1 drop into both eyes at bedtime.    . Magnesium Oxide 250 MG TABS Take 400 mg by mouth at bedtime.     . metolazone (ZAROXOLYN) 2.5 MG tablet Take 2.5 mg (1 tab) once as directed by CHF clinic. Call before taking [phone] 3 tablet 0  . metoprolol tartrate (LOPRESSOR) 50 MG tablet TAKE ONE AND ONE-HALF TABLETS (75 MG) BYMOUTH TWICE DAILY. 270 tablet 2  . mupirocin cream (  BACTROBAN) 2 % Apply 1 application topically 2 (two) times daily as needed. 90 g 0  . nitroGLYCERIN (NITROLINGUAL) 0.4 MG/SPRAY spray PLACE 1 SPRAY UNDER THE TONGUE EVERY 5 MINUTES FOR 3 DOSES AS NEEDED FOR CHEST PAIN 14.7 g 0  . Polyethyl Glycol-Propyl Glycol (SYSTANE) 0.4-0.3 % SOLN Apply 1 drop to eye daily as needed (dry eyes).    . PRALUENT 150 MG/ML SOAJ INJECT 150MG SUBCUTANEOUSLY EVERY 2 WEEKS 6 mL 3  . pravastatin (PRAVACHOL) 10 MG tablet Take 1 tablet (10 mg total) by mouth daily. 90 tablet 3  . silver nitrate applicators 75-25 % applicator Use as needed to stop superficial skin bleeding. 10 each 0  . spironolactone (ALDACTONE) 25 MG tablet Take 0.5 tablets (12.5 mg total) by mouth daily. 90 tablet 1  . torsemide (DEMADEX) 20 MG tablet Take 40 mg by mouth daily. Take extra if needed for fluid    . triamcinolone cream (KENALOG) 0.5 % Apply 1 application topically daily as needed (inflamation).     . ULORIC 40 MG tablet Take 1 tablet (40 mg total) by mouth at bedtime. 90 tablet 3  . UNABLE TO FIND Med Name: So Clean CPAP cleaning system 1 each 0  . VASCEPA 1 g CAPS TAKE 2 CAPSULES (2 GRAMS TOTAL) BY MOUTHTWO TIMES DAILY 360 capsule 3  . Multiple Vitamins-Minerals (MULTIVITAMIN WITH MINERALS) tablet  Take 1 tablet by mouth every morning.      No current facility-administered medications for this visit.      Past Medical History:  Diagnosis Date  . Anxiety   . Aortic stenosis    a. bicuspid aortic valve; mean gradient of 20 mmHg in 2/10; b. s/p bioprosth AVR (19-mm Edwards pericardial valve) with a redo coronary artery bypass graft procedure in 07/2009; postoperative Dressler's syndrome    . Arthritis   . Breast cancer (HCC)   . Carcinoma, renal cell (HCC) 05/2007   Laparoscopic right nephrectomy  . Chromophilic renal cell carcinoma (HCC)   . Chronic diastolic CHF (congestive heart failure) (HCC)    a. 9.2014 EchoP EF 60-65%, no rwma, bioprosth AVR, mean gradient of 15mmHg, mild to mod MR, PASP 51mmHg.  . CKD (chronic kidney disease), stage II   . Colitis, ischemic (HCC) 2008  . Coronary artery disease    a. initial CABG with SVG-RCA in 12/05. b. redo SVG-RCA and bioprosthetic AVR in 1/11. d. 9/14 - PCI with DES to distal LM/proximal LAD was done rather than bypass as she would have been a poor candidate for redo sternotomy. d. S/p DES to prox LM 05/2014.  . Diabetes mellitus 03/2010    TYPE II: Hemoglobin A1c of 7.4 in  03/2010; 8.4 in 06/2010; treated with insulin pump  . Duodenal ulcer    Remote; H. pylori positive  . Gastroparesis due to DM (HCC) 05/01/2012  . Genetic testing 01/14/2017   Ms. Bounds underwent genetic counseling and testing for hereditary cancer syndromes on 01/01/2017. Her results were negative for mutations in all 83 genes analyzed by [facility]'s 83-gene Common Hereditary Cancers Panel. Genes analyzed include: ALK, APC, ATM, AXIN2, BAP1, BARD1, BLM, BMPR1A, BRCA1, BRCA2, BRIP1, CASR, CDC73, CDH1, CDK4, CDKN1B, CDKN1C, CDKN2A, CEBPA, CHEK2, CTNNA1, DICER1, DIS3L2,   . Hodgkin's disease (HCC) 1991   Mantle radiation therapy  . Hodgkin's disease (HCC)   . Hyperlipidemia   . Hyperplastic gastric polyp 06/23/2012  . Hypertension   . Iron deficiency anemia    a.  03/2013 EGD: essentially normal. Possible slow GIB.  .   Migraines   . NASH (nonalcoholic steatohepatitis) 1999   -biopsy in 1999  . OSA on CPAP 04/24/2017   Moderate with AHI 22/hr now on CPAP at 18cm H2o  . Osteopenia     hip on DEXA in October 2007.  . Osteoporosis   . Oxygen deficiency   . Pacemaker    six sinus syndrome  . PAF (paroxysmal atrial fibrillation) (HCC)    a.  h/o brief episodes noted on ppm interrogation. b. given GI bleeding and need to be on both ASA 81 and Brilinta, risks likely outweigh benefits of anticoagulation.   . Renal insufficiency   . Small bowel obstruction (HCC)    Recurrent; resolved after resection of a lipoma  . Syncope    a. H/o recurrent syncope with pauses on loop recorder s/p Medtronic pacemaker 07/2012.    ROS:   All systems reviewed and negative except as noted in the HPI.   Past Surgical History:  Procedure Laterality Date  . ABDOMINAL HYSTERECTOMY    . AORTIC VALVE REPLACEMENT  2011   Aortic valve replacement surgery with a 19 mm bioprosthetic device post redo CABG January 2011.  . BONE MARROW BIOPSY    . BREAST EXCISIONAL BIOPSY     benign, bilateral  . CARDIAC CATHETERIZATION    . CERVICAL BIOPSY    . COLONOSCOPY     Multiple with adenomatous polyps  . COLONOSCOPY  06/23/2012   Procedure: COLONOSCOPY;  Surgeon: Carl E Gessner, MD;  Location: [facility];  Service: Endoscopy;  Laterality: N/A;  . CORONARY ARTERY BYPASS GRAFT  2005, 2011   CABG-most recent SVG to RCA-12/05; RCA occluded with patent graft in 2006; Redo bypass in 07/2009  . ESOPHAGOGASTRODUODENOSCOPY    . ESOPHAGOGASTRODUODENOSCOPY  06/23/2012   Procedure: ESOPHAGOGASTRODUODENOSCOPY (EGD);  Surgeon: Carl E Gessner, MD;  Location: [facility];  Service: Endoscopy;  Laterality: N/A;  . ESOPHAGOGASTRODUODENOSCOPY N/A 04/06/2013   Procedure: ESOPHAGOGASTRODUODENOSCOPY (EGD);  Surgeon: John N Perry, MD;  Location: [facility];  Service: Endoscopy;  Laterality: N/A;  .  EXPLORATORY LAPAROTOMY W/ BOWEL RESECTION     Small bowel resection of lipoma  . GIVENS CAPSULE STUDY N/A 04/30/2013   Procedure: GIVENS CAPSULE STUDY;  Surgeon: Carl E Gessner, MD;  Location: [facility];  Service: Endoscopy;  Laterality: N/A;  . LEFT AND RIGHT HEART CATHETERIZATION WITH CORONARY ANGIOGRAM N/A 04/07/2013   Procedure: LEFT AND RIGHT HEART CATHETERIZATION WITH CORONARY ANGIOGRAM;  Surgeon: [name] S McLean, MD;  Location: [facility];  Service: Cardiovascular;  Laterality: N/A;  . LEFT ATRIAL APPENDAGE OCCLUSION N/A 02/16/2016   Watchman not placed - nickel allergy  . LEFT HEART CATHETERIZATION WITH CORONARY/GRAFT ANGIOGRAM N/A 06/07/2014   Procedure: LEFT HEART CATHETERIZATION WITH CORONARY/GRAFT ANGIOGRAM;  Surgeon: Christopher D McAlhany, MD;  Location: [facility];  Service: Cardiovascular;  Laterality: N/A;  . LIVER BIOPSY    . LOOP RECORDER IMPLANT N/A 12/10/2011   Procedure: LOOP RECORDER IMPLANT;  Surgeon: Gregg W Taylor, MD;  Location: [facility];  Service: Cardiovascular;  Laterality: N/A;  . MALONEY DILATION  06/23/2012   Procedure: MALONEY DILATION;  Surgeon: Carl E Gessner, MD;  Location: [facility];  Service: Endoscopy;;  54 fr  . MASTECTOMY Left 05/17/2015   total   . NEPHRECTOMY  2008   Laparoscopic right nephrectomy, renal cell carcinoma  . PACEMAKER INSERTION  08/21/2012   Medtronic Adapta L dual-chamber pacemaker  . PERCUTANEOUS CORONARY STENT INTERVENTION (PCI-S) N/A 04/09/2013   Procedure: PERCUTANEOUS CORONARY STENT INTERVENTION (  PCI-S);  Surgeon: Christopher D McAlhany, MD;  Location: [facility];  Service: Cardiovascular;  Laterality: N/A;  . PERMANENT PACEMAKER INSERTION N/A 08/21/2012   Procedure: PERMANENT PACEMAKER INSERTION;  Surgeon: Gregg W Taylor, MD;  Location: [facility];  Service: Cardiovascular;  Laterality: N/A;  . RIGHT HEART CATH AND CORONARY/GRAFT ANGIOGRAPHY N/A 04/19/2017   Procedure: RIGHT HEART CATH AND CORONARY/GRAFT ANGIOGRAPHY;   Surgeon: McLean, [name] S, MD;  Location: [facility];  Service: Cardiovascular;  Laterality: N/A;  . TEE WITHOUT CARDIOVERSION N/A 12/07/2015   Procedure: TRANSESOPHAGEAL ECHOCARDIOGRAM (TEE);  Surgeon: [name] S McLean, MD;  Location: [facility];  Service: Cardiovascular;  Laterality: N/A;  . TOTAL ABDOMINAL HYSTERECTOMY W/ BILATERAL SALPINGOOPHORECTOMY  2000   endometriosis and ovarian cysts  . TOTAL MASTECTOMY Left 05/17/2015   Procedure: LEFT TOTAL MASTECTOMY;  Surgeon: Matthew Wakefield, MD;  Location: [facility];  Service: General;  Laterality: Left;  . TUMOR EXCISION  1981   removal of Hodgkins lymphoma     Family History  Problem Relation Age of Onset  . Heart attack Mother        d.61  . Heart disease Mother   . Heart attack Father        d.60  . Diabetes Father   . Heart disease Father   . Lung cancer Sister        d.61 metastases to brain. History of smoking. Maternal half-sister.  . Cancer Sister 30       unspecified gynecologic cancer (either cervical or ovarian) in her 30s.  . Heart attack Maternal Grandmother   . Cancer Maternal Grandfather 50       d.52s oral cancer  . Hypertension Brother   . Glaucoma Brother   . Breast cancer Sister 41       d.42 from metastatic disease. Paternal half-sister.  . Colon cancer Neg Hx   . Stomach cancer Neg Hx      Social History   Socioeconomic History  . Marital status: Married    Spouse name: Not on file  . Number of children: 2  . Years of education: Not on file  . Highest education level: Not on file  Occupational History  . Occupation: retired    Comment: elementary principal  Social Needs  . Financial resource strain: Not on file  . Food insecurity    Worry: Not on file    Inability: Not on file  . Transportation needs    Medical: Not on file    Non-medical: Not on file  Tobacco Use  . Smoking status: Never Smoker  . Smokeless tobacco: Never Used  Substance and Sexual Activity  . Alcohol use: No     Alcohol/week: 0.0 standard drinks  . Drug use: No  . Sexual activity: Yes  Lifestyle  . Physical activity    Days per week: Not on file    Minutes per session: Not on file  . Stress: Not on file  Relationships  . Social connections    Talks on phone: Not on file    Gets together: Not on file    Attends religious service: Not on file    Active member of club or organization: Not on file    Attends meetings of clubs or organizations: Not on file    Relationship status: Not on file  . Intimate partner violence    Fear of current or ex partner: Not on file    Emotionally abused: Not on file      Physically abused: Not on file    Forced sexual activity: Not on file  Other Topics Concern  . Not on file  Social History Narrative   Retired School principal, married    Started disability September 2013      Has one daughter in [place], works at [employer]. Has a son, Brian, who is technical engineer.   Eats all food groups.   Wear seatbelt.    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.      BP 118/64 (BP Location: Left Arm)   Pulse 74   Temp 97.6 F (36.4 C)   Ht 5' 2" (1.575 m)   SpO2 98%   BMI 20.83 kg/m   Physical Exam:  Well appearing NAD HEENT: Unremarkable Neck:  No JVD, no thyromegally Lymphatics:  No adenopathy Back:  No CVA tenderness Lungs:  Clear HEART:  Regular rate rhythm, no murmurs, no rubs, no clicks Abd:  soft, positive bowel sounds, no organomegally, no rebound, no guarding Ext:  2 plus pulses, no edema, no cyanosis, no clubbing Skin:  No rashes no nodules Neuro:  CN II through XII intact, motor grossly intact  DEVICE  Normal device function.  See PaceArt for details.   Assess/Plan: 1. Bleeding - unfortunately she is having a lot of bleeding from her SQ sites. I have discussed the best way for her to hold pressure.  2. PPM - her medtronic dDD PPM is working normally. We will follow. 3. PAF - She is not a great candidate for amiodarone. I would  recommend we use additional beta blocker if needed for rate control.  4. CAD - she is on multiple anginal meds. She will continue these for now.   Gregg Taylor,M.D. 

## 2019-03-02 ENCOUNTER — Ambulatory Visit (INDEPENDENT_AMBULATORY_CARE_PROVIDER_SITE_OTHER): Payer: Medicare Other | Admitting: *Deleted

## 2019-03-02 DIAGNOSIS — I5032 Chronic diastolic (congestive) heart failure: Secondary | ICD-10-CM

## 2019-03-02 DIAGNOSIS — I495 Sick sinus syndrome: Secondary | ICD-10-CM

## 2019-03-02 LAB — CUP PACEART REMOTE DEVICE CHECK
Battery Impedance: 332 Ohm
Battery Remaining Longevity: 128 mo
Battery Voltage: 2.8 V
Brady Statistic AP VP Percent: 0 %
Brady Statistic AP VS Percent: 1 %
Brady Statistic AS VP Percent: 0 %
Brady Statistic AS VS Percent: 99 %
Date Time Interrogation Session: 20200810183802
Implantable Lead Implant Date: 20140130
Implantable Lead Implant Date: 20140130
Implantable Lead Location: 753859
Implantable Lead Location: 753860
Implantable Lead Model: 5076
Implantable Lead Model: 5076
Implantable Pulse Generator Implant Date: 20140130
Lead Channel Impedance Value: 435 Ohm
Lead Channel Impedance Value: 576 Ohm
Lead Channel Pacing Threshold Amplitude: 0.375 V
Lead Channel Pacing Threshold Amplitude: 0.625 V
Lead Channel Pacing Threshold Pulse Width: 0.4 ms
Lead Channel Pacing Threshold Pulse Width: 0.4 ms
Lead Channel Setting Pacing Amplitude: 1.5 V
Lead Channel Setting Pacing Amplitude: 2.5 V
Lead Channel Setting Pacing Pulse Width: 0.4 ms
Lead Channel Setting Sensing Sensitivity: 5.6 mV

## 2019-03-03 ENCOUNTER — Ambulatory Visit (HOSPITAL_COMMUNITY)
Admission: RE | Admit: 2019-03-03 | Discharge: 2019-03-03 | Disposition: A | Discharge status: Home / Self Care | Payer: Medicare Other | Source: Ambulatory Visit | Attending: Cardiology | Admitting: Cardiology

## 2019-03-03 ENCOUNTER — Other Ambulatory Visit (HOSPITAL_COMMUNITY): Payer: Self-pay

## 2019-03-03 ENCOUNTER — Other Ambulatory Visit: Payer: Self-pay

## 2019-03-03 VITALS — BP 118/62 | HR 68 | Wt 116.4 lb

## 2019-03-03 DIAGNOSIS — I5032 Chronic diastolic (congestive) heart failure: Secondary | ICD-10-CM | POA: Diagnosis present

## 2019-03-03 DIAGNOSIS — R0989 Other specified symptoms and signs involving the circulatory and respiratory systems: Secondary | ICD-10-CM | POA: Insufficient documentation

## 2019-03-03 DIAGNOSIS — I2510 Atherosclerotic heart disease of native coronary artery without angina pectoris: Secondary | ICD-10-CM | POA: Diagnosis not present

## 2019-03-03 DIAGNOSIS — Z7982 Long term (current) use of aspirin: Secondary | ICD-10-CM | POA: Insufficient documentation

## 2019-03-03 DIAGNOSIS — Z905 Acquired absence of kidney: Secondary | ICD-10-CM | POA: Diagnosis not present

## 2019-03-03 DIAGNOSIS — E1043 Type 1 diabetes mellitus with diabetic autonomic (poly)neuropathy: Secondary | ICD-10-CM | POA: Insufficient documentation

## 2019-03-03 DIAGNOSIS — Z8673 Personal history of transient ischemic attack (TIA), and cerebral infarction without residual deficits: Secondary | ICD-10-CM | POA: Insufficient documentation

## 2019-03-03 DIAGNOSIS — Z955 Presence of coronary angioplasty implant and graft: Secondary | ICD-10-CM | POA: Insufficient documentation

## 2019-03-03 DIAGNOSIS — Z79899 Other long term (current) drug therapy: Secondary | ICD-10-CM | POA: Insufficient documentation

## 2019-03-03 DIAGNOSIS — N184 Chronic kidney disease, stage 4 (severe): Secondary | ICD-10-CM | POA: Insufficient documentation

## 2019-03-03 DIAGNOSIS — I130 Hypertensive heart and chronic kidney disease with heart failure and stage 1 through stage 4 chronic kidney disease, or unspecified chronic kidney disease: Secondary | ICD-10-CM | POA: Insufficient documentation

## 2019-03-03 DIAGNOSIS — Z888 Allergy status to other drugs, medicaments and biological substances status: Secondary | ICD-10-CM | POA: Insufficient documentation

## 2019-03-03 DIAGNOSIS — I495 Sick sinus syndrome: Secondary | ICD-10-CM | POA: Insufficient documentation

## 2019-03-03 DIAGNOSIS — Z9013 Acquired absence of bilateral breasts and nipples: Secondary | ICD-10-CM | POA: Insufficient documentation

## 2019-03-03 DIAGNOSIS — Z794 Long term (current) use of insulin: Secondary | ICD-10-CM | POA: Insufficient documentation

## 2019-03-03 DIAGNOSIS — Z951 Presence of aortocoronary bypass graft: Secondary | ICD-10-CM | POA: Diagnosis not present

## 2019-03-03 DIAGNOSIS — I480 Paroxysmal atrial fibrillation: Secondary | ICD-10-CM | POA: Insufficient documentation

## 2019-03-03 DIAGNOSIS — D696 Thrombocytopenia, unspecified: Secondary | ICD-10-CM | POA: Insufficient documentation

## 2019-03-03 DIAGNOSIS — D509 Iron deficiency anemia, unspecified: Secondary | ICD-10-CM | POA: Diagnosis not present

## 2019-03-03 DIAGNOSIS — G4733 Obstructive sleep apnea (adult) (pediatric): Secondary | ICD-10-CM | POA: Diagnosis not present

## 2019-03-03 DIAGNOSIS — Z952 Presence of prosthetic heart valve: Secondary | ICD-10-CM | POA: Diagnosis not present

## 2019-03-03 DIAGNOSIS — Z881 Allergy status to other antibiotic agents status: Secondary | ICD-10-CM | POA: Insufficient documentation

## 2019-03-03 DIAGNOSIS — Z7902 Long term (current) use of antithrombotics/antiplatelets: Secondary | ICD-10-CM | POA: Insufficient documentation

## 2019-03-03 DIAGNOSIS — Z885 Allergy status to narcotic agent status: Secondary | ICD-10-CM | POA: Insufficient documentation

## 2019-03-03 DIAGNOSIS — Z9641 Presence of insulin pump (external) (internal): Secondary | ICD-10-CM | POA: Insufficient documentation

## 2019-03-03 DIAGNOSIS — M109 Gout, unspecified: Secondary | ICD-10-CM | POA: Diagnosis not present

## 2019-03-03 DIAGNOSIS — Z86000 Personal history of in-situ neoplasm of breast: Secondary | ICD-10-CM | POA: Insufficient documentation

## 2019-03-03 DIAGNOSIS — Z8571 Personal history of Hodgkin lymphoma: Secondary | ICD-10-CM | POA: Insufficient documentation

## 2019-03-03 DIAGNOSIS — Z8249 Family history of ischemic heart disease and other diseases of the circulatory system: Secondary | ICD-10-CM | POA: Insufficient documentation

## 2019-03-03 DIAGNOSIS — I25119 Atherosclerotic heart disease of native coronary artery with unspecified angina pectoris: Secondary | ICD-10-CM | POA: Diagnosis not present

## 2019-03-03 DIAGNOSIS — E1022 Type 1 diabetes mellitus with diabetic chronic kidney disease: Secondary | ICD-10-CM | POA: Diagnosis not present

## 2019-03-03 DIAGNOSIS — Z8711 Personal history of peptic ulcer disease: Secondary | ICD-10-CM | POA: Insufficient documentation

## 2019-03-03 DIAGNOSIS — E785 Hyperlipidemia, unspecified: Secondary | ICD-10-CM | POA: Insufficient documentation

## 2019-03-03 DIAGNOSIS — Z85528 Personal history of other malignant neoplasm of kidney: Secondary | ICD-10-CM | POA: Insufficient documentation

## 2019-03-03 DIAGNOSIS — Z91048 Other nonmedicinal substance allergy status: Secondary | ICD-10-CM | POA: Insufficient documentation

## 2019-03-03 DIAGNOSIS — Z950 Presence of cardiac pacemaker: Secondary | ICD-10-CM | POA: Diagnosis not present

## 2019-03-03 DIAGNOSIS — Z880 Allergy status to penicillin: Secondary | ICD-10-CM | POA: Insufficient documentation

## 2019-03-03 LAB — LIPID PANEL
Cholesterol: 89 mg/dL (ref 0–200)
HDL: 38 mg/dL — ABNORMAL LOW (ref >40)
LDL Cholesterol: 22 mg/dL (ref 0–99)
Total CHOL/HDL Ratio: 2.3 RATIO
Triglycerides: 147 mg/dL (ref <150)
VLDL: 29 mg/dL (ref 0–40)

## 2019-03-03 LAB — BASIC METABOLIC PANEL
Anion gap: 12 (ref 5–15)
BUN: 64 mg/dL — ABNORMAL HIGH (ref 8–23)
CO2: 25 mmol/L (ref 22–32)
Calcium: 10.1 mg/dL (ref 8.9–10.3)
Chloride: 101 mmol/L (ref 98–111)
Creatinine, Ser: 2.46 mg/dL — ABNORMAL HIGH (ref 0.44–1.00)
GFR calc Af Amer: 23 mL/min — ABNORMAL LOW (ref >60)
GFR calc non Af Amer: 20 mL/min — ABNORMAL LOW (ref >60)
Glucose, Bld: 200 mg/dL — ABNORMAL HIGH (ref 70–99)
Potassium: 4.9 mmol/L (ref 3.5–5.1)
Sodium: 138 mmol/L (ref 135–145)

## 2019-03-03 LAB — CBC
HCT: 38 % (ref 36.0–46.0)
Hemoglobin: 12.1 g/dL (ref 12.0–15.0)
MCH: 30.1 pg (ref 26.0–34.0)
MCHC: 31.8 g/dL (ref 30.0–36.0)
MCV: 94.5 fL (ref 80.0–100.0)
Platelets: 130 10*3/uL — ABNORMAL LOW (ref 150–400)
RBC: 4.02 MIL/uL (ref 3.87–5.11)
RDW: 12.8 % (ref 11.5–15.5)
WBC: 10 10*3/uL (ref 4.0–10.5)
nRBC: 0 % (ref 0.0–0.2)

## 2019-03-03 LAB — PROTIME-INR
INR: 1.2 (ref 0.8–1.2)
Prothrombin Time: 14.7 seconds (ref 11.4–15.2)

## 2019-03-03 MED ORDER — TORSEMIDE 20 MG PO TABS: 60.0000 mg | ORAL_TABLET | Freq: Every day | ORAL | 6 refills | Status: DC

## 2019-03-03 MED ORDER — SODIUM CHLORIDE 0.9% FLUSH: 3.0000 mL | Freq: Two times a day (BID) | INTRAVENOUS | Status: DC

## 2019-03-03 NOTE — Patient Instructions (Addendum)
INCREASE Lasix to 11m (3 tabs) daily)  You have been ordered for a stress test (lexiscan) you will get a call to schedule this appointment.  Your physician has requested that you have an echocardiogram. Echocardiography is a painless test that uses sound waves to create images of your heart. It provides your doctor with information about the size and shape of your heart and how well your heart's chambers and valves are working. This procedure takes approximately one hour. There are no restrictions for this procedure.   Your physician recommends that you schedule a follow-up appointment in: 1 month with Dr MLiam Grahamtoday and repeat in 1 week We will only contact you if something comes back abnormal or we need to make some changes. Otherwise no news is good news!   [facility] [id number]: [id number]: [phone] BMARSHAY SLATES 03/03/2019  You are scheduled for a Cardiac Catheterization on  Wednesday, August 26th, 2020 with Dr MAundra Dubin 1. [facility] [address] [place] [id number] 10am. (This time is two hours before your procedure to ensure your preparation). Free valet parking service is available.   Special note: Every effort is made to have your procedure done on time. Please understand that emergencies sometimes delay scheduled procedures.  2. Diet: Do not eat solid foods or drink liquids after midnight.   3. Labs: Covid [facility] Monday[facility]th, 2020 [facility] See handout for instructions.  You have to remain quarantined until the morning of your procedure to reduce risk of exposure.  4. Medication instructions in preparation for your procedure: PLEASE DISCUSS DOSING INSTRUCTIONS FOR YOUR INSULIN WITH YOUR ENDOCRINOLOGIST IN  PREPARATION OF YOUR PROCEDURE.   Contrast Allergy: No  On the morning of your procedure, take your Aspirin and Brillinta.  You may use sips of water.  HOLD ALL OTHER MEDS on the morning of your procedure   5. Plan for one night stay--bring personal belongings. 6. Bring a current list of your medications and current insurance cards. 7. You MUST have a responsible person to drive you home. 8. Someone MUST be with you the first 24 hours after you arrive home or your discharge will be delayed. 9. Please wear clothes that are easy to get on and off and wear slip-on shoes.  Thank you for allowing [facility] for you!   -- [facility]

## 2019-03-03 NOTE — Progress Notes (Signed)
Date:  03/03/2019   ID:  Zynasia, Burklow 1951-07-23, MRN [id number]   Provider location: [address], [place] Type of Visit: Established patient  PCP:  [name], MD  Cardiologist:  No primary care provider on file. Primary HF: Dr. Aundra Dubin   History of Present Illness: [name] [name] is a 68 y.o. female who has a history of CAD s/p CABG in 12/05 and redo in 1/11, bioprosthetic AVR, diastolic CHF, and sick sinus syndrome s/p PCM presents for cardiology followup.  She had initial CABG with SVG-RCA in 12/05 followed by redo SVG-RCA and bioprosthetic AVR in 1/11.  She has had diastolic CHF and is on Lasix.  In 9/14, she had a CPX test.  This showed severe functional limitation with ischemic ECG changes (inferolateral ST depression and ST elevation in V1 and V2). She had chest pain.  She had R/LHC in 9/14.  This showed elevated left and right heart filling pressures, patent SVG-PDA, and 80% distal LM/80% ostial LAD stenosis.  She was not a candidate for redo CABG (had 2 prior sternotomies as well as chest radiation for Hodgkins lymphoma).  She had DES from left main into proximal LAD and will need long-term DAPT => Brilinta was used as she has been a poor responder to Plavix in the past.  She re-developed angina in 11/15 and was admitted with unstable angina.  LHC showed 80% ostial LM and patent SVG-RCA. She had DES to ostial LM. Echo in 1/18 showed EF 60-65%, normal bioprosthetic aortic valve, mild-moderate MR, PASP 35 mmHg.   She has chronic iron deficiency anemia thought to be due to a slow GI bleed.  Last EGD was unremarkable and a capsule endoscopy was also unremarkable.  She has had periodic transfusions.    She had bilateral mastectomy for DCIS in 75/91 without complication.  She has an insulin pump followed by an endocrinologist at [facility].   Given myalgias with statins, she was started on Praluent.  LDL is now excellent.    In the past she has had short  runs of atrial fibrillation.  She is not anticoagulated. Planned for Watchman placement, but given her nickel allergy, we were unable to place a Watchman.     Diagnosed with OSA, now on CPAP.   Given ongoing problems with exertional dyspnea and occasional chest pain, Cardiolite was repeated in 9/18 and showed inferior changes.  She underwent left/right heart cath in 9/18.  This showed elevated filling pressures, R>L.  There was 75+% ostial LCx stenosis.  However, there were 2 layers of stent from the mid left main into the ostial LAD, and LCx intervention was thought to be very risky.  It was decided to manage her medically.  Echo was repeated in 10/18 showing EF 55-60%, moderate to severe TR.    Given recurrent chest pain, she had ETT-Cardiolite done in 3/19.  This was a normal study with no evidence for ischemia or infarction.  Echo in 1/20 showed EF 55-60%, mild LVH, stable bioprosthetic aortic valve, moderate-severe TR.   She returns today for followup of CHF and CAD.  For a number of weeks now, she has been more short of breath with exertion.  She is getting dyspneic with most moderate exertion.  Increased abdominal swelling and she has also developed ankle edema that comes up by the end of the day. She still tries to walk on her treadmill but has had to slow the speed down considerably.  She  is short of breath when lying down in bed.  No chest pain though she notices mild tightness with heavy exertion.  No palpitations.  Fatigue is worse. Weight is only up 1 lb since last appointment. She additionally says that she was seen by Dr Lorrene Reid recently, and creatinine had increased to >2.   REDS clip: 56%   Labs (2/19): hgb 13.2 Labs @ [facility] (3/19): K 4.7, creatinine 1.4, ANA negative, RF negative Labs (4/19): K 3.9, creatinine 1.52 => 1.63 Labs (6/19): LDL 8, HDL 42, TGs 259 Labs (9/19): K 4.9, creatinine 1.65, hgb 13.4, plts 129, LDL 46,TGs 182 Labs (12/19): K 4.9, creatinine 2.07, hgb 12.4  Labs (3/20): K 4, creatinine 1.8 Labs (6/20): K 4.7, creatinine 1.47 Labs (8/20): creatinine 2.13  PMH: 1. Sick sinus syndrome s/p [employer] PCM.  2. HTN 3. H/o SBO 4. Renal cell carcinoma s/p right nephrectomy in 2008.  5. 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  Fibroscan in 11/14 showed advanced fibrosis concerning for cirrhosis. EGD in 11/14 showed no varices.  6. Diastolic CHF: Echo (2/68) with EF 60-65%, bioprosthetic aortic valve with mean gradient 15 mmHg, mild MR.  CPX (9/14): peak VO2 10.4, VE/VCO2 43.8, inferolateral ST depression and ST elevation in V1/V2 with severe dyspnea and chest pain, severe functional limitation with ischemic changes.  Echo (9/14) with EF 60-65%, normal RV size and systolic function, mild-moderate MR, bioprosthetic aortic valve normal, PA systolic pressure 51 mmHg, no evidence for pericardial constriction though exam incomplete for constriction.  Echo (11/15) with EF 55-60%, bioprosthetic aortic valve, mild-moderate MR, moderate TR, PA systolic pressure 42 mmHg.  - TEE (5/17) with EF 60-65%, normal bioprosthetic aortic valve with mean gradient 13 mmHg, normal RV size and systolic function. - Echo (1/18) with EF 60-65%, normal bioprosthetic aortic valve, mild-moderate MR, PASP 35 mmHg.  - Echo (10/18) with EF 55-60%, bioprosthetic aortic valve mean gradient 10 mmHg, MAC with mild mitral stenosis (mean gradient 7) and mild mitral regurgitation, moderate to severe TR, PASP 43 mmHg.  - RHC (9/18): mean RA 13, PA 44/18 mean 33, mean PCWP 19, CI 3.45, PVR 2.7 WU.  - Echo (1/20): EF 55-60%, mild LVH, mild MR, stable bioprosthetic aortic valve, moderate-severe TR.  7. TAH-BSO 8. Type I diabetes: Has insulin pump.  9. H/o ischemic colitis. 10. H/o PUD. 11. Diabetic gastroparesis. 12. Hyperlipidemia: Myalgias with Crestor and atorvastatin, elevated LFTs with simvastatin.  Myalgias with > 10 mg pravastatin. Now on Praluent.  13. CKD stage IV: Sees Dr. Lorrene Reid 14.  Bicuspid aortic valve s/p 19 mm Edwards pericardial valve in 1/11 (had post-operative Dresslers syndrome).   15. CAD: CABG with SVG-RCA in 12/05.  Redo SVG-RCA in 1/11 with AVR.  LHC/RHC (9/14) with mean RA 18, PA 62/25 mean 43, mean PCWP 26, CI 2.8, 70-80% distal LM stenosis, 80% ostial LAD stenosis, total occlusion RCA, SVG-PDA patent.  Patient had DES LM into LAD.  Plan to continue long-term ASA/Brilinta (poor responder to Plavix).  ETT-cardiolite (4/15) with 6' exercise, EF 60%, ST depression in recovery and mild chest pain, no ischemia or infarction by perfusion images.  Unstable angina 11/15 with 80% LM, patent SVG-RCA => DES to ostial LM.   - Lexiscan Cardiolite (2/18): EF 62%, no ischemia or infarction (normal).  She had a significant reaction to [place] and probably should not get again.  - Cardiolite (9/18): EF 72%, prior inferior infarct with mild peri-infarct ischemia.  - LHC (9/18): 2 layers stent left main - ostial LAD  with 50% dLM in-stent restenosis, 75+% ostial LCx, totally occluded RCA, SVG-RCA patent.  Medical management planned.  - ETT-Cardiolite (4/19): EF normal, 7'11" exercise, no ischemia or infarction.  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  17. Anemia: Iron-deficiency.  Suspect chronic GI blood loss.  EGD in 9/14 was unremarkable.  Capsule endoscopy 10/14 was unremarkable.  Has required periodic blood transfusions.  18. Atrial fibrillation: Paroxysmal, noted by [facility] interrogation.  19. Carotid stenosis: Carotid dopplers (2/15) with 40-59% bilateral stenosis. Carotid dopplers (3/16) with 40-59% bilateral ICA stenosis. Carotid dopplers (3/17) with 40-59% bilateral ICA stenosis.  - Carotid dopplers (3/18) with mild BICA stenosis.  20. 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  22. C difficile colitis 2017 23. Gout  24. ?TIA: head CT negative.  25. OSA: Moderate, uses CPAP.  26. Chronic thrombocytopenia 27. Hypercalcemia  Current Outpatient Medications  Medication Sig Dispense Refill  . ALPRAZolam (XANAX) 0.5 MG tablet Take 1 tablet (0.5 mg total) by mouth 2 (two) times daily as needed for anxiety. (Patient taking differently: Take 0.5 mg by mouth 2 (two) times daily as needed for anxiety. Taken one at night) 60 tablet 1  . amLODipine (NORVASC) 2.5 MG tablet TAKE ONE TABLET (2.5MG TOTAL) BY MOUTH DAILY 90 tablet 1  . APIDRA 100 UNIT/ML injection Inject 46 Units into the skin as directed. INFUSE THROUGH INSULIN PUMP UTD::[place] 46.15 units daily, Bolus depends on carb intake  2  . aspirin EC 81 MG tablet Take 81 mg by mouth at bedtime.    Marland Kitchen BRILINTA 60 MG TABS tablet TAKE ONE TABLET (60 MG TOTAL) BY MOUTH TWICE DAILY. 180 tablet 2  . Cholecalciferol 2000 UNITS TABS Take 1 tablet by mouth every morning.     . clobetasol cream (TEMOVATE) 2.59 % Apply 1 application topically 2 (two) times daily as needed.     . Coenzyme Q10 (CO Q 10) 100 MG CAPS Take 1 capsule by mouth at bedtime.     . colchicine 0.6 MG tablet Take 0.6 mg by mouth daily as needed (gout flare).    Marland Kitchen docusate sodium (COLACE) 100 MG capsule Take 100 mg by mouth as needed.     . famotidine (PEPCID) 40 MG tablet Take 40 mg by mouth daily.     . Fe Fum-FA-B Cmp-C-Zn-Mg-Mn-Cu (HEMATINIC PLUS VIT/MINERALS) 106-1 MG TABS TAKE 1 TABLET BY MOUTH THREE TIMES A WEEK(MONDAY, WEDNESDAY, AND FRIDAY) OR AS DIRECTED WITH A VITAMIN "C" 90 tablet 0  . fluticasone (FLONASE) 50 MCG/ACT nasal spray Place 2 sprays into both nostrils daily. (Patient taking differently: Place 2 sprays into both nostrils as needed. ) 48 g 3  . glucagon (GLUCAGON EMERGENCY) 1 MG injection Inject 1 mg into the vein once as needed (for severe reaction). Reported on 12/05/2015    . isosorbide mononitrate (IMDUR) 120 MG 24 hr tablet TAKE ONE TABLET (120MG TOTAL) BY MOUTH DAILY 90 tablet 3  .  latanoprost (XALATAN) 0.005 % ophthalmic solution Place 1 drop into both eyes at bedtime.    . Magnesium Oxide 250 MG TABS Take 400 mg by mouth at bedtime.     . metolazone (ZAROXOLYN) 2.5 MG tablet Take 2.5 mg (1 tab) once as directed by CHF clinic. Call before taking [phone] 3 tablet 0  . metoprolol tartrate (LOPRESSOR) 50 MG tablet TAKE ONE AND ONE-HALF TABLETS (75 MG) BYMOUTH TWICE DAILY. [place]  tablet 2  . mupirocin cream (BACTROBAN) 2 % Apply 1 application topically 2 (two) times daily as needed. 90 g 0  . nitroGLYCERIN (NITROLINGUAL) 0.4 MG/SPRAY spray PLACE 1 SPRAY UNDER THE TONGUE EVERY 5 MINUTES FOR 3 DOSES AS NEEDED FOR CHEST PAIN 14.7 g 0  . Polyethyl Glycol-Propyl Glycol (SYSTANE) 0.4-0.3 % SOLN Apply 1 drop to eye daily as needed (dry eyes).    . PRALUENT 150 MG/ML SOAJ INJECT 150MG SUBCUTANEOUSLY EVERY 2 WEEKS 6 mL 3  . pravastatin (PRAVACHOL) 10 MG tablet Take 1 tablet (10 mg total) by mouth daily. 90 tablet 3  . silver nitrate applicators 69-62 % applicator Use as needed to stop superficial skin bleeding. 10 each 0  . spironolactone (ALDACTONE) 25 MG tablet Take 0.5 tablets (12.5 mg total) by mouth daily. 90 tablet 1  . torsemide (DEMADEX) 20 MG tablet Take 3 tablets (60 mg total) by mouth daily. Take extra if needed for fluid 90 tablet 6  . triamcinolone cream (KENALOG) 0.5 % Apply 1 application topically daily as needed (inflamation).     Marland Kitchen ULORIC 40 MG tablet Take 1 tablet (40 mg total) by mouth at bedtime. 90 tablet 3  . UNABLE TO FIND Med Name: So Clean CPAP cleaning system 1 each 0  . VASCEPA 1 g CAPS TAKE 2 CAPSULES (2 GRAMS TOTAL) BY MOUTHTWO TIMES DAILY 360 capsule 3   No current facility-administered medications for this encounter.     Allergies:   Avandia [rosiglitazone maleate], Cephalexin, Clindamycin hcl, Lincomycin, Metformin, Novolog [insulin aspart], Penicillins, Rosiglitazone, Tizanidine, Adhesive [tape], Atorvastatin, Cholestyramine, Gentamycin [gentamicin],  Humalog [insulin lispro], Iodinated diagnostic agents, Limonene, Pravastatin, Prednisone, Ranexa [ranolazine], Rosuvastatin, Simvastatin, Strawberry extract, Clopidogrel, Doxycycline, Plavix [clopidogrel bisulfate], Codeine, Erythromycin, Hydrocodone-acetaminophen, Latex, Neomycin, Nickel, Ranitidine, Tamiflu [oseltamivir], and Tramadol   Social History:  The patient  reports that she has never smoked. She has never used smokeless tobacco. She reports that she does not drink alcohol or use drugs.   Family History:  The patient's family history includes Breast cancer (age of onset: 50) in her sister; Cancer (age of onset: 81) in her sister; Cancer (age of onset: 66) in her maternal grandfather; Diabetes in her father; Glaucoma in her brother; Heart attack in her father, maternal grandmother, and mother; Heart disease in her father and mother; Hypertension in her brother; Lung cancer in her sister.   ROS:  Please see the history of present illness.   All other systems are personally reviewed and negative.   Exam:   BP 118/62   Pulse 68   Wt 52.8 kg (116 lb 6.4 oz)   SpO2 98%   BMI 21.29 kg/m  General: NAD Neck: JVP 10 cm, no thyromegaly or thyroid nodule.  Lungs: Clear to auscultation bilaterally with normal respiratory effort. CV: Nondisplaced PMI.  Heart regular S1/S2, no S3/S4, 2/6 early SEM RUSB.  No peripheral edema.  No carotid bruit.  Normal pedal pulses.  Abdomen: Soft, nontender, no hepatosplenomegaly, mild distention.  Skin: Intact without lesions or rashes.  Neurologic: Alert and oriented x 3.  Psych: Normal affect. Extremities: No clubbing or cyanosis.  HEENT: Normal.    Recent Labs: 12/31/2018: ALT 23 03/03/2019: BUN 64; Creatinine, Ser 2.46; Hemoglobin 12.1; Platelets 130; Potassium 4.9; Sodium 138  Personally reviewed   Wt Readings from Last 3 Encounters:  03/03/19 52.8 kg (116 lb 6.4 oz)  01/12/19 51.7 kg (113 lb 14.4 oz)  12/31/18 51 kg (112 lb 8 oz)      ASSESSMENT  AND PLAN:  1. CAD: s/p SVG-RCA in 12/05, then redo SVG-RCA in 1/11-->distal left main/proximal LAD severe stenosis in 9/14. PCI with DES to distal LM/proximal LAD was done rather than bypass as she would have been a poor candidate for redo sternotomy (2 prior sternotomies and history of chest wall radiation for Hodgkins disease). Recurrent unstable angina with 80% ostial LM treated with repeat DES in 11/15.  Lexiscan Cardiolite in 9/18 inferior changes.  Repeat LHC (9/18) showed 50% distal left main in-stent restenosis and 75+% ostial LCx stenosis. I reviewed the films at the time with interventional cardiology => would be very difficult to intervene on the ostial LCx through 2 overlapping stents from left main into the LAD.  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  Given no ACS, we planned medical management and increased imdur.  Cardiolite in 3/19 showed no ischemia or infarction.  She has mild chest tightness with heavy exertion.  Exertional dyspnea is much more prominent than chest pain.  - Continue Brilinta 60 mg bid (poor responder to plavix) long-term given recurrent left main disease.  - Continue ASA 81, statin/Praluent/Vascepa, metoprolol, and Imdur 120 mg daily.  - She was unable to tolerate ranolazine.  - I will arrange for Lexiscan Cardiolite to assess for ischemia.  Would have very high threshold for coronary angiography with worsening renal function and known non-intervenable LCx lesion.  2. Bioprosthetic aortic valve replacement: In setting of bicuspid aortic valve, thoracic aorta not dilated on 2017 TEE. Valve looked ok on 1/20 echo.  3. Chronic diastolic CHF: Most recent echo in 1/20 showed normal LV systolic function but also showed moderate to severe TR.  Last RHC was concerning for right > left heart failure. She is symptomatically worse today, NYHA class III for several weeks.  Unfortunately, her renal function has also worsened.  This is  concerning for worsening RV failure with cardiorenal syndrome.  She denies use of NSAIDs.  She is volume overloaded on exam with markedly high REDS clip reading. - Increase torsemide to 60 mg daily, but will have to follow creatinine closely with this change.  BMET today and again next week.   - Continue spironolactone 12.5 mg daily.  - I will arrange for RHC to assess filling pressure and cardiac output. I discussed risks/benefits and she agrees to the procedure.  - She will need repeat echo, I will arrange.  - She may end up needing to be admitted for closer monitoring of renal function and IV diuresis.  4. CKD: Patient has a single kidney s/p right nephrectomy and suspected diabetic nephropathy. CKD stage 4, slowly progressive.  Creatinine was > 2 recently when checked at nephrology office.  This is concerning, as above, for worsening cardiorenal syndrome in the setting of RV > LV failure.   - BMET today and again next week.  5. Hyperlipidemia: She has not been able to tolerate any higher potency statin than pravastatin 10 mg daily. Good lipids in 9/19.  - Continue pravastatin and Praluent.  - She is on Vascepa for elevated TGs.   6. Anemia: Chronic/slow GI bleed. Unremarkable EGD and capsule endoscopy in fall 2014. Unfortunately, needs to stay on Brilinta long-term (Plavix non-responder). Rectal bleeding earlier this year thought to be diverticular, has now resolved.  Has followup with GI.  7. Atrial fibrillation: Paroxysmal, brief episodes noted on PCM interrogation. Given GI bleeding and need to be on Brilinta, risks have been thought to outweigh benefits of anticoagulation.  She is not a good Multaq candidate with CHF. If she needs an antiarrhythmic, consider Tikosyn or Sotalol but renal function may make these meds difficult to use.  No recent palpitations.  - We were unable to place Watchman device due to nickel allergy.  8. Carotid bruit: Only mild stenosis on last dopplers in 3/18.   9. SSS: Pacemaker in place.  10. HTN: controlled. Continue current regimen.  11. OSA: Moderate, using CPAP.  12. ?TIA: Transient dysarthria in 3/18, could have been afib-related TIA. No recurrent symptoms.  13. Thrombocytopenia: Chronic and mild. Follows with hematology.   Followup in 1 month.    Signed, [name], MD  03/03/2019  [facility] [address] [facility] [place] [address] [phone] (office) [phone] (fax)

## 2019-03-03 NOTE — H&P (View-Only) (Signed)
   Date:  03/03/2019   ID:  [name] [name], DOB 04/15/1951, MRN [id number]   Provider location: [address], [place] Type of Visit: Established patient  PCP:  Hagler, Rachel, MD  Cardiologist:  No primary care provider on file. Primary HF: Dr. Lennart Gladish   History of Present Illness: [name] [name] is a 67 y.o. female who has a history of CAD s/p CABG in 12/05 and redo in 1/11, bioprosthetic AVR, diastolic CHF, and sick sinus syndrome s/p PCM presents for cardiology followup.  She had initial CABG with SVG-RCA in 12/05 followed by redo SVG-RCA and bioprosthetic AVR in 1/11.  She has had diastolic CHF and is on Lasix.  In 9/14, she had a CPX test.  This showed severe functional limitation with ischemic ECG changes (inferolateral ST depression and ST elevation in V1 and V2). She had chest pain.  She had R/LHC in 9/14.  This showed elevated left and right heart filling pressures, patent SVG-PDA, and 80% distal LM/80% ostial LAD stenosis.  She was not a candidate for redo CABG (had 2 prior sternotomies as well as chest radiation for Hodgkins lymphoma).  She had DES from left main into proximal LAD and will need long-term DAPT => Brilinta was used as she has been a poor responder to Plavix in the past.  She re-developed angina in 11/15 and was admitted with unstable angina.  LHC showed 80% ostial LM and patent SVG-RCA. She had DES to ostial LM. Echo in 1/18 showed EF 60-65%, normal bioprosthetic aortic valve, mild-moderate MR, PASP 35 mmHg.   She has chronic iron deficiency anemia thought to be due to a slow GI bleed.  Last EGD was unremarkable and a capsule endoscopy was also unremarkable.  She has had periodic transfusions.    She had bilateral mastectomy for DCIS in 10/16 without complication.  She has an insulin pump followed by an endocrinologist at [facility].   Given myalgias with statins, she was started on Praluent.  LDL is now excellent.    In the past she has had short  runs of atrial fibrillation.  She is not anticoagulated. Planned for Watchman placement, but given her nickel allergy, we were unable to place a Watchman.     Diagnosed with OSA, now on CPAP.   Given ongoing problems with exertional dyspnea and occasional chest pain, Cardiolite was repeated in 9/18 and showed inferior changes.  She underwent left/right heart cath in 9/18.  This showed elevated filling pressures, R>L.  There was 75+% ostial LCx stenosis.  However, there were 2 layers of stent from the mid left main into the ostial LAD, and LCx intervention was thought to be very risky.  It was decided to manage her medically.  Echo was repeated in 10/18 showing EF 55-60%, moderate to severe TR.    Given recurrent chest pain, she had ETT-Cardiolite done in 3/19.  This was a normal study with no evidence for ischemia or infarction.  Echo in 1/20 showed EF 55-60%, mild LVH, stable bioprosthetic aortic valve, moderate-severe TR.   She returns today for followup of CHF and CAD.  For a number of weeks now, she has been more short of breath with exertion.  She is getting dyspneic with most moderate exertion.  Increased abdominal swelling and she has also developed ankle edema that comes up by the end of the day. She still tries to walk on her treadmill but has had to slow the speed down considerably.  She   is short of breath when lying down in bed.  No chest pain though she notices mild tightness with heavy exertion.  No palpitations.  Fatigue is worse. Weight is only up 1 lb since last appointment. She additionally says that she was seen by Dr Dunham recently, and creatinine had increased to >2.   REDS clip: 56%   Labs (2/19): hgb 13.2 Labs @ [facility] (3/19): K 4.7, creatinine 1.4, ANA negative, RF negative Labs (4/19): K 3.9, creatinine 1.52 => 1.63 Labs (6/19): LDL 8, HDL 42, TGs 259 Labs (9/19): K 4.9, creatinine 1.65, hgb 13.4, plts 129, LDL 46,TGs 182 Labs (12/19): K 4.9, creatinine 2.07, hgb 12.4  Labs (3/20): K 4, creatinine 1.8 Labs (6/20): K 4.7, creatinine 1.47 Labs (8/20): creatinine 2.13  PMH: 1. Sick sinus syndrome s/p [employer] PCM.  2. HTN 3. H/o SBO 4. Renal cell carcinoma s/p right nephrectomy in 2008.  5. 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  Fibroscan in 11/14 showed advanced fibrosis concerning for cirrhosis. EGD in 11/14 showed no varices.  6. Diastolic CHF: Echo (4/14) with EF 60-65%, bioprosthetic aortic valve with mean gradient 15 mmHg, mild MR.  CPX (9/14): peak VO2 10.4, VE/VCO2 43.8, inferolateral ST depression and ST elevation in V1/V2 with severe dyspnea and chest pain, severe functional limitation with ischemic changes.  Echo (9/14) with EF 60-65%, normal RV size and systolic function, mild-moderate MR, bioprosthetic aortic valve normal, PA systolic pressure 51 mmHg, no evidence for pericardial constriction though exam incomplete for constriction.  Echo (11/15) with EF 55-60%, bioprosthetic aortic valve, mild-moderate MR, moderate TR, PA systolic pressure 42 mmHg.  - TEE (5/17) with EF 60-65%, normal bioprosthetic aortic valve with mean gradient 13 mmHg, normal RV size and systolic function. - Echo (1/18) with EF 60-65%, normal bioprosthetic aortic valve, mild-moderate MR, PASP 35 mmHg.  - Echo (10/18) with EF 55-60%, bioprosthetic aortic valve mean gradient 10 mmHg, MAC with mild mitral stenosis (mean gradient 7) and mild mitral regurgitation, moderate to severe TR, PASP 43 mmHg.  - RHC (9/18): mean RA 13, PA 44/18 mean 33, mean PCWP 19, CI 3.45, PVR 2.7 WU.  - Echo (1/20): EF 55-60%, mild LVH, mild MR, stable bioprosthetic aortic valve, moderate-severe TR.  7. TAH-BSO 8. Type I diabetes: Has insulin pump.  9. H/o ischemic colitis. 10. H/o PUD. 11. Diabetic gastroparesis. 12. Hyperlipidemia: Myalgias with Crestor and atorvastatin, elevated LFTs with simvastatin.  Myalgias with > 10 mg pravastatin. Now on Praluent.  13. CKD stage IV: Sees Dr. Dunham 14.  Bicuspid aortic valve s/p 19 mm Edwards pericardial valve in 1/11 (had post-operative Dresslers syndrome).   15. CAD: CABG with SVG-RCA in 12/05.  Redo SVG-RCA in 1/11 with AVR.  LHC/RHC (9/14) with mean RA 18, PA 62/25 mean 43, mean PCWP 26, CI 2.8, 70-80% distal LM stenosis, 80% ostial LAD stenosis, total occlusion RCA, SVG-PDA patent.  Patient had DES LM into LAD.  Plan to continue long-term ASA/Brilinta (poor responder to Plavix).  ETT-cardiolite (4/15) with 6' exercise, EF 60%, ST depression in recovery and mild chest pain, no ischemia or infarction by perfusion images.  Unstable angina 11/15 with 80% LM, patent SVG-RCA => DES to ostial LM.   - Lexiscan Cardiolite (2/18): EF 62%, no ischemia or infarction (normal).  She had a significant reaction to Lexiscan and probably should not get again.  - Cardiolite (9/18): EF 72%, prior inferior infarct with mild peri-infarct ischemia.  - LHC (9/18): 2 layers stent left main - ostial LAD   with 50% dLM in-stent restenosis, 75+% ostial LCx, totally occluded RCA, SVG-RCA patent.  Medical management planned.  - ETT-Cardiolite (4/19): EF normal, 7'11" exercise, no ischemia or infarction.  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  17. Anemia: Iron-deficiency.  Suspect chronic GI blood loss.  EGD in 9/14 was unremarkable.  Capsule endoscopy 10/14 was unremarkable.  Has required periodic blood transfusions.  18. Atrial fibrillation: Paroxysmal, noted by PCM interrogation.  19. Carotid stenosis: Carotid dopplers (2/15) with 40-59% bilateral stenosis. Carotid dopplers (3/16) with 40-59% bilateral ICA stenosis. Carotid dopplers (3/17) with 40-59% bilateral ICA stenosis.  - Carotid dopplers (3/18) with mild BICA stenosis.  20. 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  22. C difficile colitis 2017 23. Gout  24. ?TIA: head CT negative.  25. OSA: Moderate, uses CPAP.  26. Chronic thrombocytopenia 27. Hypercalcemia  Current Outpatient Medications  Medication Sig Dispense Refill  . ALPRAZolam (XANAX) 0.5 MG tablet Take 1 tablet (0.5 mg total) by mouth 2 (two) times daily as needed for anxiety. (Patient taking differently: Take 0.5 mg by mouth 2 (two) times daily as needed for anxiety. Taken one at night) 60 tablet 1  . amLODipine (NORVASC) 2.5 MG tablet TAKE ONE TABLET (2.5MG TOTAL) BY MOUTH DAILY 90 tablet 1  . APIDRA 100 UNIT/ML injection Inject 46 Units into the skin as directed. INFUSE THROUGH INSULIN PUMP UTD::Basil 46.15 units daily, Bolus depends on carb intake  2  . aspirin EC 81 MG tablet Take 81 mg by mouth at bedtime.    . BRILINTA 60 MG TABS tablet TAKE ONE TABLET (60 MG TOTAL) BY MOUTH TWICE DAILY. 180 tablet 2  . Cholecalciferol 2000 UNITS TABS Take 1 tablet by mouth every morning.     . clobetasol cream (TEMOVATE) 0.05 % Apply 1 application topically 2 (two) times daily as needed.     . Coenzyme Q10 (CO Q 10) 100 MG CAPS Take 1 capsule by mouth at bedtime.     . colchicine 0.6 MG tablet Take 0.6 mg by mouth daily as needed (gout flare).    . docusate sodium (COLACE) 100 MG capsule Take 100 mg by mouth as needed.     . famotidine (PEPCID) 40 MG tablet Take 40 mg by mouth daily.     . Fe Fum-FA-B Cmp-C-Zn-Mg-Mn-Cu (HEMATINIC PLUS VIT/MINERALS) 106-1 MG TABS TAKE 1 TABLET BY MOUTH THREE TIMES A WEEK(MONDAY, WEDNESDAY, AND FRIDAY) OR AS DIRECTED WITH A VITAMIN "C" 90 tablet 0  . fluticasone (FLONASE) 50 MCG/ACT nasal spray Place 2 sprays into both nostrils daily. (Patient taking differently: Place 2 sprays into both nostrils as needed. ) 48 g 3  . glucagon (GLUCAGON EMERGENCY) 1 MG injection Inject 1 mg into the vein once as needed (for severe reaction). Reported on 12/05/2015    . isosorbide mononitrate (IMDUR) 120 MG 24 hr tablet TAKE ONE TABLET (120MG TOTAL) BY MOUTH DAILY 90 tablet 3  .  latanoprost (XALATAN) 0.005 % ophthalmic solution Place 1 drop into both eyes at bedtime.    . Magnesium Oxide 250 MG TABS Take 400 mg by mouth at bedtime.     . metolazone (ZAROXOLYN) 2.5 MG tablet Take 2.5 mg (1 tab) once as directed by CHF clinic. Call before taking [phone] 3 tablet 0  . metoprolol tartrate (LOPRESSOR) 50 MG tablet TAKE ONE AND ONE-HALF TABLETS (75 MG) BYMOUTH TWICE DAILY. 270   tablet 2  . mupirocin cream (BACTROBAN) 2 % Apply 1 application topically 2 (two) times daily as needed. 90 g 0  . nitroGLYCERIN (NITROLINGUAL) 0.4 MG/SPRAY spray PLACE 1 SPRAY UNDER THE TONGUE EVERY 5 MINUTES FOR 3 DOSES AS NEEDED FOR CHEST PAIN 14.7 g 0  . Polyethyl Glycol-Propyl Glycol (SYSTANE) 0.4-0.3 % SOLN Apply 1 drop to eye daily as needed (dry eyes).    . PRALUENT 150 MG/ML SOAJ INJECT 150MG SUBCUTANEOUSLY EVERY 2 WEEKS 6 mL 3  . pravastatin (PRAVACHOL) 10 MG tablet Take 1 tablet (10 mg total) by mouth daily. 90 tablet 3  . silver nitrate applicators 75-25 % applicator Use as needed to stop superficial skin bleeding. 10 each 0  . spironolactone (ALDACTONE) 25 MG tablet Take 0.5 tablets (12.5 mg total) by mouth daily. 90 tablet 1  . torsemide (DEMADEX) 20 MG tablet Take 3 tablets (60 mg total) by mouth daily. Take extra if needed for fluid 90 tablet 6  . triamcinolone cream (KENALOG) 0.5 % Apply 1 application topically daily as needed (inflamation).     . ULORIC 40 MG tablet Take 1 tablet (40 mg total) by mouth at bedtime. 90 tablet 3  . UNABLE TO FIND Med Name: So Clean CPAP cleaning system 1 each 0  . VASCEPA 1 g CAPS TAKE 2 CAPSULES (2 GRAMS TOTAL) BY MOUTHTWO TIMES DAILY 360 capsule 3   No current facility-administered medications for this encounter.     Allergies:   Avandia [rosiglitazone maleate], Cephalexin, Clindamycin hcl, Lincomycin, Metformin, Novolog [insulin aspart], Penicillins, Rosiglitazone, Tizanidine, Adhesive [tape], Atorvastatin, Cholestyramine, Gentamycin [gentamicin],  Humalog [insulin lispro], Iodinated diagnostic agents, Limonene, Pravastatin, Prednisone, Ranexa [ranolazine], Rosuvastatin, Simvastatin, Strawberry extract, Clopidogrel, Doxycycline, Plavix [clopidogrel bisulfate], Codeine, Erythromycin, Hydrocodone-acetaminophen, Latex, Neomycin, Nickel, Ranitidine, Tamiflu [oseltamivir], and Tramadol   Social History:  The patient  reports that she has never smoked. She has never used smokeless tobacco. She reports that she does not drink alcohol or use drugs.   Family History:  The patient's family history includes Breast cancer (age of onset: 41) in her sister; Cancer (age of onset: 30) in her sister; Cancer (age of onset: 50) in her maternal grandfather; Diabetes in her father; Glaucoma in her brother; Heart attack in her father, maternal grandmother, and mother; Heart disease in her father and mother; Hypertension in her brother; Lung cancer in her sister.   ROS:  Please see the history of present illness.   All other systems are personally reviewed and negative.   Exam:   BP 118/62   Pulse 68   Wt 52.8 kg (116 lb 6.4 oz)   SpO2 98%   BMI 21.29 kg/m  General: NAD Neck: JVP 10 cm, no thyromegaly or thyroid nodule.  Lungs: Clear to auscultation bilaterally with normal respiratory effort. CV: Nondisplaced PMI.  Heart regular S1/S2, no S3/S4, 2/6 early SEM RUSB.  No peripheral edema.  No carotid bruit.  Normal pedal pulses.  Abdomen: Soft, nontender, no hepatosplenomegaly, mild distention.  Skin: Intact without lesions or rashes.  Neurologic: Alert and oriented x 3.  Psych: Normal affect. Extremities: No clubbing or cyanosis.  HEENT: Normal.    Recent Labs: 12/31/2018: ALT 23 03/03/2019: BUN 64; Creatinine, Ser 2.46; Hemoglobin 12.1; Platelets 130; Potassium 4.9; Sodium 138  Personally reviewed   Wt Readings from Last 3 Encounters:  03/03/19 52.8 kg (116 lb 6.4 oz)  01/12/19 51.7 kg (113 lb 14.4 oz)  12/31/18 51 kg (112 lb 8 oz)      ASSESSMENT    AND PLAN:  1. CAD: s/p SVG-RCA in 12/05, then redo SVG-RCA in 1/11-->distal left main/proximal LAD severe stenosis in 9/14. PCI with DES to distal LM/proximal LAD was done rather than bypass as she would have been a poor candidate for redo sternotomy (2 prior sternotomies and history of chest wall radiation for Hodgkins disease). Recurrent unstable angina with 80% ostial LM treated with repeat DES in 11/15.  Lexiscan Cardiolite in 9/18 inferior changes.  Repeat LHC (9/18) showed 50% distal left main in-stent restenosis and 75+% ostial LCx stenosis. I reviewed the films at the time with interventional cardiology => would be very difficult to intervene on the ostial LCx through 2 overlapping stents from left main into the LAD.  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  Given no ACS, we planned medical management and increased imdur.  Cardiolite in 3/19 showed no ischemia or infarction.  She has mild chest tightness with heavy exertion.  Exertional dyspnea is much more prominent than chest pain.  - Continue Brilinta 60 mg bid (poor responder to plavix) long-term given recurrent left main disease.  - Continue ASA 81, statin/Praluent/Vascepa, metoprolol, and Imdur 120 mg daily.  - She was unable to tolerate ranolazine.  - I will arrange for Lexiscan Cardiolite to assess for ischemia.  Would have very high threshold for coronary angiography with worsening renal function and known non-intervenable LCx lesion.  2. Bioprosthetic aortic valve replacement: In setting of bicuspid aortic valve, thoracic aorta not dilated on 2017 TEE. Valve looked ok on 1/20 echo.  3. Chronic diastolic CHF: Most recent echo in 1/20 showed normal LV systolic function but also showed moderate to severe TR.  Last RHC was concerning for right > left heart failure. She is symptomatically worse today, NYHA class III for several weeks.  Unfortunately, her renal function has also worsened.  This is  concerning for worsening RV failure with cardiorenal syndrome.  She denies use of NSAIDs.  She is volume overloaded on exam with markedly high REDS clip reading. - Increase torsemide to 60 mg daily, but will have to follow creatinine closely with this change.  BMET today and again next week.   - Continue spironolactone 12.5 mg daily.  - I will arrange for RHC to assess filling pressure and cardiac output. I discussed risks/benefits and she agrees to the procedure.  - She will need repeat echo, I will arrange.  - She may end up needing to be admitted for closer monitoring of renal function and IV diuresis.  4. CKD: Patient has a single kidney s/p right nephrectomy and suspected diabetic nephropathy. CKD stage 4, slowly progressive.  Creatinine was > 2 recently when checked at nephrology office.  This is concerning, as above, for worsening cardiorenal syndrome in the setting of RV > LV failure.   - BMET today and again next week.  5. Hyperlipidemia: She has not been able to tolerate any higher potency statin than pravastatin 10 mg daily. Good lipids in 9/19.  - Continue pravastatin and Praluent.  - She is on Vascepa for elevated TGs.   6. Anemia: Chronic/slow GI bleed. Unremarkable EGD and capsule endoscopy in fall 2014. Unfortunately, needs to stay on Brilinta long-term (Plavix non-responder). Rectal bleeding earlier this year thought to be diverticular, has now resolved.  Has followup with GI.  7. Atrial fibrillation: Paroxysmal, brief episodes noted on PCM interrogation. Given GI bleeding and need to be on Brilinta, risks have been thought to outweigh benefits of anticoagulation.    She is not a good Multaq candidate with CHF. If she needs an antiarrhythmic, consider Tikosyn or Sotalol but renal function may make these meds difficult to use.  No recent palpitations.  - We were unable to place Watchman device due to nickel allergy.  8. Carotid bruit: Only mild stenosis on last dopplers in 3/18.   9. SSS: Pacemaker in place.  10. HTN: controlled. Continue current regimen.  11. OSA: Moderate, using CPAP.  12. ?TIA: Transient dysarthria in 3/18, could have been afib-related TIA. No recurrent symptoms.  13. Thrombocytopenia: Chronic and mild. Follows with hematology.   Followup in 1 month.    Signed, Brylee Mcgreal, MD  03/03/2019  [facility] [address] [facility] [place] [address] [phone] (office) [phone] (fax) 

## 2019-03-03 NOTE — Progress Notes (Signed)
ReDS Vest - 03/03/19 1100      ReDS Vest   MR   Moderate  (Pended)     Estimated volume prior to reading  High  (Pended)     Fitting Posture  Sitting  (Pended)     Height Marker  Short  (Pended)     Ruler Value  31  (Pended)     Center Strip  Aligned  (Pended)     ReDS Value  56  (Pended)

## 2019-03-03 NOTE — Progress Notes (Signed)
Spoke with patient on phone, she endorses that date and time of cath does not work for her because her family is planned for a trip on the wknd before procedure and cannot quarantine.  New date and time discussed and works with patient.  All instructions reviewed.  New AVS mailed to patient. Pt verbalized understanding.

## 2019-03-04 ENCOUNTER — Telehealth (HOSPITAL_COMMUNITY): Payer: Self-pay | Admitting: *Deleted

## 2019-03-04 NOTE — Telephone Encounter (Signed)
Patient given detailed instructions per Myocardial Perfusion Study Information Sheet for the test on 03/05/19. Patient notified to arrive 15 minutes early and that it is imperative to arrive on time for appointment to keep from having the test rescheduled.  If you need to cancel or reschedule your appointment, please call the office within 24 hours of your appointment. . Patient verbalized understanding. Kirstie Peri

## 2019-03-05 ENCOUNTER — Ambulatory Visit (HOSPITAL_COMMUNITY): Payer: Medicare Other | Attending: Cardiovascular Disease

## 2019-03-05 ENCOUNTER — Other Ambulatory Visit: Payer: Self-pay

## 2019-03-05 DIAGNOSIS — I5032 Chronic diastolic (congestive) heart failure: Secondary | ICD-10-CM | POA: Diagnosis present

## 2019-03-05 LAB — MYOCARDIAL PERFUSION IMAGING
LV dias vol: 53 mL (ref 46–106)
LV sys vol: 16 mL
Peak HR: 75 {beats}/min
Rest HR: 64 {beats}/min
SDS: 4
SRS: 3
SSS: 7
TID: 0.97

## 2019-03-05 MED ORDER — TECHNETIUM TC 99M TETROFOSMIN IV KIT
31.4000 | PACK | Freq: Once | INTRAVENOUS | Status: AC | PRN
  Administered 2019-03-05: 31.4 via INTRAVENOUS
  Filled 2019-03-05: qty 32

## 2019-03-05 MED ORDER — TECHNETIUM TC 99M TETROFOSMIN IV KIT
10.8000 | PACK | Freq: Once | INTRAVENOUS | Status: AC | PRN
  Administered 2019-03-05: 10.8 via INTRAVENOUS
  Filled 2019-03-05: qty 11

## 2019-03-05 MED ORDER — REGADENOSON 0.4 MG/5ML IV SOLN
0.4000 mg | Freq: Once | INTRAVENOUS | Status: AC
  Administered 2019-03-05: 0.4 mg via INTRAVENOUS

## 2019-03-05 MED ORDER — AMINOPHYLLINE 25 MG/ML IV SOLN
75.0000 mg | Freq: Once | INTRAVENOUS | Status: AC
  Administered 2019-03-05: 75 mg via INTRAVENOUS

## 2019-03-06 ENCOUNTER — Telehealth (HOSPITAL_COMMUNITY): Payer: Self-pay

## 2019-03-06 NOTE — Telephone Encounter (Signed)
Patient aware of results and appreciative

## 2019-03-06 NOTE — Telephone Encounter (Signed)
-----   Message from Larey Dresser, MD sent at 03/05/2019  5:35 PM EDT ----- Normal study, no ischemia or infarction.

## 2019-03-10 ENCOUNTER — Ambulatory Visit (HOSPITAL_COMMUNITY)
Admission: RE | Admit: 2019-03-10 | Discharge: 2019-03-10 | Disposition: A | Discharge status: Home / Self Care | Payer: Medicare Other | Source: Ambulatory Visit | Attending: Cardiology | Admitting: Cardiology

## 2019-03-10 ENCOUNTER — Other Ambulatory Visit: Payer: Self-pay

## 2019-03-10 ENCOUNTER — Ambulatory Visit (HOSPITAL_COMMUNITY)
Admission: RE | Admit: 2019-03-10 | Discharge: 2019-03-10 | Disposition: A | Discharge status: Home / Self Care | Payer: Medicare Other | Source: Ambulatory Visit | Attending: Internal Medicine | Admitting: Internal Medicine

## 2019-03-10 DIAGNOSIS — I110 Hypertensive heart disease with heart failure: Secondary | ICD-10-CM | POA: Diagnosis not present

## 2019-03-10 DIAGNOSIS — E119 Type 2 diabetes mellitus without complications: Secondary | ICD-10-CM | POA: Diagnosis not present

## 2019-03-10 DIAGNOSIS — I5032 Chronic diastolic (congestive) heart failure: Secondary | ICD-10-CM | POA: Diagnosis present

## 2019-03-10 DIAGNOSIS — G4730 Sleep apnea, unspecified: Secondary | ICD-10-CM | POA: Insufficient documentation

## 2019-03-10 DIAGNOSIS — I050 Rheumatic mitral stenosis: Secondary | ICD-10-CM | POA: Diagnosis not present

## 2019-03-10 DIAGNOSIS — I4891 Unspecified atrial fibrillation: Secondary | ICD-10-CM | POA: Diagnosis not present

## 2019-03-10 DIAGNOSIS — I272 Pulmonary hypertension, unspecified: Secondary | ICD-10-CM | POA: Diagnosis not present

## 2019-03-10 DIAGNOSIS — E785 Hyperlipidemia, unspecified: Secondary | ICD-10-CM | POA: Diagnosis not present

## 2019-03-10 DIAGNOSIS — Z952 Presence of prosthetic heart valve: Secondary | ICD-10-CM | POA: Insufficient documentation

## 2019-03-10 DIAGNOSIS — Z950 Presence of cardiac pacemaker: Secondary | ICD-10-CM | POA: Diagnosis not present

## 2019-03-10 LAB — BASIC METABOLIC PANEL
Anion gap: 11 (ref 5–15)
BUN: 58 mg/dL — ABNORMAL HIGH (ref 8–23)
CO2: 26 mmol/L (ref 22–32)
Calcium: 10.1 mg/dL (ref 8.9–10.3)
Chloride: 101 mmol/L (ref 98–111)
Creatinine, Ser: 2.4 mg/dL — ABNORMAL HIGH (ref 0.44–1.00)
GFR calc Af Amer: 24 mL/min — ABNORMAL LOW (ref >60)
GFR calc non Af Amer: 20 mL/min — ABNORMAL LOW (ref >60)
Glucose, Bld: 243 mg/dL — ABNORMAL HIGH (ref 70–99)
Potassium: 4.3 mmol/L (ref 3.5–5.1)
Sodium: 138 mmol/L (ref 135–145)

## 2019-03-10 LAB — CBC
HCT: 37.7 % (ref 36.0–46.0)
Hemoglobin: 12.1 g/dL (ref 12.0–15.0)
MCH: 30 pg (ref 26.0–34.0)
MCHC: 32.1 g/dL (ref 30.0–36.0)
MCV: 93.3 fL (ref 80.0–100.0)
Platelets: 117 10*3/uL — ABNORMAL LOW (ref 150–400)
RBC: 4.04 MIL/uL (ref 3.87–5.11)
RDW: 13 % (ref 11.5–15.5)
WBC: 8.4 10*3/uL (ref 4.0–10.5)
nRBC: 0 % (ref 0.0–0.2)

## 2019-03-10 NOTE — Progress Notes (Signed)
Echocardiogram 2D Echocardiogram has been performed.  Oneal Deputy Festus Pursel 03/10/2019, 11:01 AM

## 2019-03-11 NOTE — Progress Notes (Signed)
Remote pacemaker transmission.   

## 2019-03-13 ENCOUNTER — Other Ambulatory Visit (HOSPITAL_COMMUNITY): Payer: Medicare Other

## 2019-03-16 ENCOUNTER — Other Ambulatory Visit (HOSPITAL_COMMUNITY)
Admission: RE | Admit: 2019-03-16 | Discharge: 2019-03-16 | Disposition: A | Discharge status: Home / Self Care | Payer: Medicare Other | Source: Ambulatory Visit | Attending: Cardiology | Admitting: Cardiology

## 2019-03-16 DIAGNOSIS — Z20828 Contact with and (suspected) exposure to other viral communicable diseases: Secondary | ICD-10-CM | POA: Insufficient documentation

## 2019-03-16 DIAGNOSIS — Z01812 Encounter for preprocedural laboratory examination: Secondary | ICD-10-CM | POA: Diagnosis present

## 2019-03-16 LAB — SARS CORONAVIRUS 2 (TAT 6-24 HRS): SARS Coronavirus 2: NEGATIVE

## 2019-03-18 ENCOUNTER — Encounter (HOSPITAL_COMMUNITY): Payer: Self-pay | Admitting: Cardiology

## 2019-03-18 ENCOUNTER — Ambulatory Visit (HOSPITAL_COMMUNITY)
Admission: RE | Admit: 2019-03-18 | Discharge: 2019-03-18 | Disposition: A | Discharge status: Home / Self Care | Payer: Medicare Other | Attending: Cardiology | Admitting: Cardiology

## 2019-03-18 ENCOUNTER — Other Ambulatory Visit: Payer: Self-pay

## 2019-03-18 ENCOUNTER — Encounter (HOSPITAL_COMMUNITY)
Admission: RE | Disposition: A | Discharge status: Home / Self Care | Payer: Self-pay | Source: Home / Self Care | Attending: Cardiology

## 2019-03-18 DIAGNOSIS — Z91048 Other nonmedicinal substance allergy status: Secondary | ICD-10-CM | POA: Diagnosis not present

## 2019-03-18 DIAGNOSIS — I495 Sick sinus syndrome: Secondary | ICD-10-CM | POA: Diagnosis not present

## 2019-03-18 DIAGNOSIS — Z79899 Other long term (current) drug therapy: Secondary | ICD-10-CM | POA: Insufficient documentation

## 2019-03-18 DIAGNOSIS — Z794 Long term (current) use of insulin: Secondary | ICD-10-CM | POA: Insufficient documentation

## 2019-03-18 DIAGNOSIS — Z885 Allergy status to narcotic agent status: Secondary | ICD-10-CM | POA: Insufficient documentation

## 2019-03-18 DIAGNOSIS — Z880 Allergy status to penicillin: Secondary | ICD-10-CM | POA: Diagnosis not present

## 2019-03-18 DIAGNOSIS — Z950 Presence of cardiac pacemaker: Secondary | ICD-10-CM | POA: Insufficient documentation

## 2019-03-18 DIAGNOSIS — I6523 Occlusion and stenosis of bilateral carotid arteries: Secondary | ICD-10-CM | POA: Diagnosis not present

## 2019-03-18 DIAGNOSIS — D696 Thrombocytopenia, unspecified: Secondary | ICD-10-CM | POA: Insufficient documentation

## 2019-03-18 DIAGNOSIS — I509 Heart failure, unspecified: Secondary | ICD-10-CM

## 2019-03-18 DIAGNOSIS — E785 Hyperlipidemia, unspecified: Secondary | ICD-10-CM | POA: Diagnosis not present

## 2019-03-18 DIAGNOSIS — N184 Chronic kidney disease, stage 4 (severe): Secondary | ICD-10-CM | POA: Insufficient documentation

## 2019-03-18 DIAGNOSIS — Z85528 Personal history of other malignant neoplasm of kidney: Secondary | ICD-10-CM | POA: Insufficient documentation

## 2019-03-18 DIAGNOSIS — Z9013 Acquired absence of bilateral breasts and nipples: Secondary | ICD-10-CM | POA: Diagnosis not present

## 2019-03-18 DIAGNOSIS — Z955 Presence of coronary angioplasty implant and graft: Secondary | ICD-10-CM | POA: Insufficient documentation

## 2019-03-18 DIAGNOSIS — E1022 Type 1 diabetes mellitus with diabetic chronic kidney disease: Secondary | ICD-10-CM | POA: Diagnosis not present

## 2019-03-18 DIAGNOSIS — D509 Iron deficiency anemia, unspecified: Secondary | ICD-10-CM | POA: Insufficient documentation

## 2019-03-18 DIAGNOSIS — Z8249 Family history of ischemic heart disease and other diseases of the circulatory system: Secondary | ICD-10-CM | POA: Insufficient documentation

## 2019-03-18 DIAGNOSIS — Z8571 Personal history of Hodgkin lymphoma: Secondary | ICD-10-CM | POA: Insufficient documentation

## 2019-03-18 DIAGNOSIS — E1043 Type 1 diabetes mellitus with diabetic autonomic (poly)neuropathy: Secondary | ICD-10-CM | POA: Diagnosis not present

## 2019-03-18 DIAGNOSIS — I480 Paroxysmal atrial fibrillation: Secondary | ICD-10-CM | POA: Insufficient documentation

## 2019-03-18 DIAGNOSIS — K760 Fatty (change of) liver, not elsewhere classified: Secondary | ICD-10-CM | POA: Diagnosis not present

## 2019-03-18 DIAGNOSIS — Z9641 Presence of insulin pump (external) (internal): Secondary | ICD-10-CM | POA: Diagnosis not present

## 2019-03-18 DIAGNOSIS — Z881 Allergy status to other antibiotic agents status: Secondary | ICD-10-CM | POA: Diagnosis not present

## 2019-03-18 DIAGNOSIS — Z7982 Long term (current) use of aspirin: Secondary | ICD-10-CM | POA: Diagnosis not present

## 2019-03-18 DIAGNOSIS — G4733 Obstructive sleep apnea (adult) (pediatric): Secondary | ICD-10-CM | POA: Diagnosis not present

## 2019-03-18 DIAGNOSIS — Z951 Presence of aortocoronary bypass graft: Secondary | ICD-10-CM | POA: Insufficient documentation

## 2019-03-18 DIAGNOSIS — M109 Gout, unspecified: Secondary | ICD-10-CM | POA: Insufficient documentation

## 2019-03-18 DIAGNOSIS — Q231 Congenital insufficiency of aortic valve: Secondary | ICD-10-CM | POA: Diagnosis not present

## 2019-03-18 DIAGNOSIS — Z833 Family history of diabetes mellitus: Secondary | ICD-10-CM | POA: Insufficient documentation

## 2019-03-18 DIAGNOSIS — Z888 Allergy status to other drugs, medicaments and biological substances status: Secondary | ICD-10-CM | POA: Insufficient documentation

## 2019-03-18 DIAGNOSIS — I2721 Secondary pulmonary arterial hypertension: Secondary | ICD-10-CM | POA: Insufficient documentation

## 2019-03-18 DIAGNOSIS — Z8673 Personal history of transient ischemic attack (TIA), and cerebral infarction without residual deficits: Secondary | ICD-10-CM | POA: Insufficient documentation

## 2019-03-18 DIAGNOSIS — Z7902 Long term (current) use of antithrombotics/antiplatelets: Secondary | ICD-10-CM | POA: Diagnosis not present

## 2019-03-18 DIAGNOSIS — Z91041 Radiographic dye allergy status: Secondary | ICD-10-CM | POA: Insufficient documentation

## 2019-03-18 DIAGNOSIS — I130 Hypertensive heart and chronic kidney disease with heart failure and stage 1 through stage 4 chronic kidney disease, or unspecified chronic kidney disease: Secondary | ICD-10-CM | POA: Diagnosis present

## 2019-03-18 DIAGNOSIS — I5032 Chronic diastolic (congestive) heart failure: Secondary | ICD-10-CM

## 2019-03-18 DIAGNOSIS — Z905 Acquired absence of kidney: Secondary | ICD-10-CM | POA: Insufficient documentation

## 2019-03-18 HISTORY — PX: RIGHT HEART CATH: CATH118263

## 2019-03-18 LAB — GLUCOSE, CAPILLARY
Glucose-Capillary: 148 mg/dL — ABNORMAL HIGH (ref 70–99)
Glucose-Capillary: 130 mg/dL — ABNORMAL HIGH (ref 70–99)

## 2019-03-18 LAB — POCT I-STAT EG7
Acid-base deficit: 1 mmol/L (ref 0.0–2.0)
Acid-base deficit: 1 mmol/L (ref 0.0–2.0)
Bicarbonate: 24.3 mmol/L (ref 20.0–28.0)
Bicarbonate: 24.8 mmol/L (ref 20.0–28.0)
Calcium, Ion: 1.29 mmol/L (ref 1.15–1.40)
Calcium, Ion: 1.31 mmol/L (ref 1.15–1.40)
HCT: 35 % — ABNORMAL LOW (ref 36.0–46.0)
HCT: 35 % — ABNORMAL LOW (ref 36.0–46.0)
Hemoglobin: 11.9 g/dL — ABNORMAL LOW (ref 12.0–15.0)
Hemoglobin: 11.9 g/dL — ABNORMAL LOW (ref 12.0–15.0)
O2 Saturation: 60 %
O2 Saturation: 63 %
Potassium: 4.5 mmol/L (ref 3.5–5.1)
Potassium: 4.5 mmol/L (ref 3.5–5.1)
Sodium: 139 mmol/L (ref 135–145)
Sodium: 141 mmol/L (ref 135–145)
TCO2: 26 mmol/L (ref 22–32)
TCO2: 26 mmol/L (ref 22–32)
pCO2, Ven: 43.3 mmHg — ABNORMAL LOW (ref 44.0–60.0)
pCO2, Ven: 44 mmHg (ref 44.0–60.0)
pH, Ven: 7.358 (ref 7.250–7.430)
pH, Ven: 7.359 (ref 7.250–7.430)
pO2, Ven: 33 mmHg (ref 32.0–45.0)
pO2, Ven: 34 mmHg (ref 32.0–45.0)

## 2019-03-18 SURGERY — RIGHT HEART CATH
Anesthesia: LOCAL

## 2019-03-18 MED ORDER — SODIUM CHLORIDE 0.9% FLUSH: 3.0000 mL | Freq: Two times a day (BID) | INTRAVENOUS | Status: DC

## 2019-03-18 MED ORDER — SODIUM CHLORIDE 0.9 % IV SOLN: 250.0000 mL | INTRAVENOUS | Status: DC | PRN

## 2019-03-18 MED ORDER — LIDOCAINE HCL (PF) 1 % IJ SOLN
INTRAMUSCULAR | Status: AC
  Filled 2019-03-18: qty 30

## 2019-03-18 MED ORDER — ASPIRIN 81 MG PO CHEW
81.0000 mg | CHEWABLE_TABLET | ORAL | Status: AC
  Administered 2019-03-18: 81 mg via ORAL
  Filled 2019-03-18: qty 1

## 2019-03-18 MED ORDER — HEPARIN (PORCINE) IN NACL 1000-0.9 UT/500ML-% IV SOLN
INTRAVENOUS | Status: AC
  Filled 2019-03-18: qty 500

## 2019-03-18 MED ORDER — LIDOCAINE HCL (PF) 1 % IJ SOLN
INTRAMUSCULAR | Status: DC | PRN
  Administered 2019-03-18: 2 mL

## 2019-03-18 MED ORDER — HEPARIN (PORCINE) IN NACL 1000-0.9 UT/500ML-% IV SOLN
INTRAVENOUS | Status: DC | PRN
  Administered 2019-03-18: 500 mL

## 2019-03-18 MED ORDER — SODIUM CHLORIDE 0.9% FLUSH: 3.0000 mL | INTRAVENOUS | Status: DC | PRN

## 2019-03-18 MED ORDER — SODIUM CHLORIDE 0.9 % IV SOLN
INTRAVENOUS | Status: DC
  Administered 2019-03-18: 12:00:00 via INTRAVENOUS

## 2019-03-18 SURGICAL SUPPLY — 9 items
CATH BALLN WEDGE 5F 110CM (CATHETERS) ×2 IMPLANT
GUIDEWIRE .025 260CM (WIRE) ×2 IMPLANT
HEMOSTAT KELLY 5.5 SS STRL (MISCELLANEOUS) ×2 IMPLANT
PACK CARDIAC CATHETERIZATION (CUSTOM PROCEDURE TRAY) ×2 IMPLANT
SHEATH GLIDE SLENDER 4/5FR (SHEATH) ×2 IMPLANT
STOPCOCK MORSE 400PSI 3WAY (MISCELLANEOUS) ×2 IMPLANT
TRANSDUCER W/STOPCOCK (MISCELLANEOUS) ×2 IMPLANT
TUBING ART PRESS 72  MALE/FEM (TUBING) ×1
TUBING ART PRESS 72 MALE/FEM (TUBING) ×1 IMPLANT

## 2019-03-18 NOTE — Discharge Instructions (Signed)
Right Heart Catheterization, Care After  This sheet gives you information about how to care for yourself after your procedure. Your health care provider may also give you more specific instructions. If you have problems or questions, contact your health care provider. What can I expect after the procedure? After the procedure, it is common to have:  Bruising or mild discomfort in the area where the IV was inserted (insertion site). Follow these instructions at home: Eating and drinking   Follow instructions from your health care provider about eating or drinking restrictions.  Drink a lot of fluids for the first several days after the procedure, as directed by your health care provider. This helps to wash (flush) the contrast out of your body. Examples of healthy fluids include water or low-calorie drinks. General instructions  Check your IV insertion area every day for signs of infection. Check for: ? Redness, swelling, or pain. ? Fluid or blood. ? Warmth. ? Pus or a bad smell.  Take over-the-counter and prescription medicines only as told by your health care provider.  Rest and return to your normal activities as told by your health care provider. Ask your health care provider what activities are safe for you.  Do not drive for 24 hours if you were given a medicine to help you relax (sedative), or until your health care provider approves.  Keep all follow-up visits as told by your health care provider. This is important. Contact a health care provider if:  Your skin becomes itchy or you develop a rash or hives.  You have a fever that does not get better with medicine.  You feel nauseous.  You vomit.  You have redness, swelling, or pain around the insertion site.  You have fluid or blood coming from the insertion site.  Your insertion area feels warm to the touch.  You have pus or a bad smell coming from the insertion site. Get help right away if:  You have difficulty  breathing or shortness of breath.  You develop chest pain.  You faint.  You feel very dizzy. These symptoms may represent a serious problem that is an emergency. Do not wait to see if the symptoms will go away. Get medical help right away. Call your local emergency services (911 in the [place] Do not drive yourself to the hospital. Summary  After your procedure, it is common to have bruising or mild discomfort in the area where the IV was inserted.  You should check your IV insertion area every day for signs of infection.  Take over-the-counter and prescription medicines only as told by your health care provider.  You should drink a lot of fluids for the first several days after the procedure to help flush the contrast from your body. This information is not intended to replace advice given to you by your health care provider. Make sure you discuss any questions you have with your health care provider. Document Released: 04/29/2013 Document Revised: 06/21/2017 Document Reviewed: 06/02/2016 Elsevier Patient Education  2020 [employer].

## 2019-03-18 NOTE — Interval H&P Note (Signed)
History and Physical Interval Note:  03/18/2019 11:59 AM  Kerrin C Dant  has presented today for surgery, with the diagnosis of heart failure.  The various methods of treatment have been discussed with the patient and family. After consideration of risks, benefits and other options for treatment, the patient has consented to  Procedure(s): RIGHT HEART CATH (N/A) as a surgical intervention.  The patient's history has been reviewed, patient examined, no change in status, stable for surgery.  I have reviewed the patient's chart and labs.  Questions were answered to the patient's satisfaction.     Harald Quevedo [employer]

## 2019-03-19 ENCOUNTER — Telehealth (HOSPITAL_COMMUNITY): Payer: Self-pay | Admitting: *Deleted

## 2019-03-19 NOTE — Telephone Encounter (Signed)
Pt called c/o muscle soreness after cath yesterday. States it hurts when she turns her head or lifts her left arm. She said on a scale of 1-10 the pain is a 2-3. Per Dr.McLean pt may use a warm compress. Pt agreeable with plan.

## 2019-04-06 ENCOUNTER — Other Ambulatory Visit (HOSPITAL_COMMUNITY): Payer: Self-pay | Admitting: Cardiology

## 2019-04-08 ENCOUNTER — Ambulatory Visit (HOSPITAL_COMMUNITY)
Admission: RE | Admit: 2019-04-08 | Discharge: 2019-04-08 | Disposition: A | Discharge status: Home / Self Care | Payer: Medicare Other | Source: Ambulatory Visit | Attending: Cardiology | Admitting: Cardiology

## 2019-04-08 ENCOUNTER — Other Ambulatory Visit: Payer: Self-pay

## 2019-04-08 ENCOUNTER — Encounter (HOSPITAL_COMMUNITY): Payer: Self-pay | Admitting: Cardiology

## 2019-04-08 VITALS — BP 118/56 | HR 66 | Wt 114.6 lb

## 2019-04-08 DIAGNOSIS — I2510 Atherosclerotic heart disease of native coronary artery without angina pectoris: Secondary | ICD-10-CM

## 2019-04-08 DIAGNOSIS — N184 Chronic kidney disease, stage 4 (severe): Secondary | ICD-10-CM

## 2019-04-08 DIAGNOSIS — Z7982 Long term (current) use of aspirin: Secondary | ICD-10-CM | POA: Diagnosis not present

## 2019-04-08 DIAGNOSIS — Z905 Acquired absence of kidney: Secondary | ICD-10-CM | POA: Insufficient documentation

## 2019-04-08 DIAGNOSIS — Z8711 Personal history of peptic ulcer disease: Secondary | ICD-10-CM | POA: Diagnosis not present

## 2019-04-08 DIAGNOSIS — I495 Sick sinus syndrome: Secondary | ICD-10-CM | POA: Insufficient documentation

## 2019-04-08 DIAGNOSIS — D696 Thrombocytopenia, unspecified: Secondary | ICD-10-CM | POA: Insufficient documentation

## 2019-04-08 DIAGNOSIS — E785 Hyperlipidemia, unspecified: Secondary | ICD-10-CM | POA: Insufficient documentation

## 2019-04-08 DIAGNOSIS — Z888 Allergy status to other drugs, medicaments and biological substances status: Secondary | ICD-10-CM | POA: Insufficient documentation

## 2019-04-08 DIAGNOSIS — Z79899 Other long term (current) drug therapy: Secondary | ICD-10-CM | POA: Diagnosis not present

## 2019-04-08 DIAGNOSIS — Z881 Allergy status to other antibiotic agents status: Secondary | ICD-10-CM | POA: Insufficient documentation

## 2019-04-08 DIAGNOSIS — I081 Rheumatic disorders of both mitral and tricuspid valves: Secondary | ICD-10-CM | POA: Diagnosis not present

## 2019-04-08 DIAGNOSIS — I480 Paroxysmal atrial fibrillation: Secondary | ICD-10-CM

## 2019-04-08 DIAGNOSIS — Z85528 Personal history of other malignant neoplasm of kidney: Secondary | ICD-10-CM | POA: Insufficient documentation

## 2019-04-08 DIAGNOSIS — Z794 Long term (current) use of insulin: Secondary | ICD-10-CM | POA: Insufficient documentation

## 2019-04-08 DIAGNOSIS — Z7902 Long term (current) use of antithrombotics/antiplatelets: Secondary | ICD-10-CM | POA: Diagnosis not present

## 2019-04-08 DIAGNOSIS — Z91048 Other nonmedicinal substance allergy status: Secondary | ICD-10-CM | POA: Diagnosis not present

## 2019-04-08 DIAGNOSIS — G4733 Obstructive sleep apnea (adult) (pediatric): Secondary | ICD-10-CM | POA: Diagnosis not present

## 2019-04-08 DIAGNOSIS — K3184 Gastroparesis: Secondary | ICD-10-CM | POA: Diagnosis not present

## 2019-04-08 DIAGNOSIS — Z9641 Presence of insulin pump (external) (internal): Secondary | ICD-10-CM | POA: Diagnosis not present

## 2019-04-08 DIAGNOSIS — D509 Iron deficiency anemia, unspecified: Secondary | ICD-10-CM | POA: Insufficient documentation

## 2019-04-08 DIAGNOSIS — I130 Hypertensive heart and chronic kidney disease with heart failure and stage 1 through stage 4 chronic kidney disease, or unspecified chronic kidney disease: Secondary | ICD-10-CM | POA: Insufficient documentation

## 2019-04-08 DIAGNOSIS — E1022 Type 1 diabetes mellitus with diabetic chronic kidney disease: Secondary | ICD-10-CM | POA: Diagnosis not present

## 2019-04-08 DIAGNOSIS — E1043 Type 1 diabetes mellitus with diabetic autonomic (poly)neuropathy: Secondary | ICD-10-CM | POA: Diagnosis not present

## 2019-04-08 DIAGNOSIS — M109 Gout, unspecified: Secondary | ICD-10-CM | POA: Insufficient documentation

## 2019-04-08 DIAGNOSIS — K760 Fatty (change of) liver, not elsewhere classified: Secondary | ICD-10-CM | POA: Insufficient documentation

## 2019-04-08 DIAGNOSIS — I5032 Chronic diastolic (congestive) heart failure: Secondary | ICD-10-CM

## 2019-04-08 DIAGNOSIS — Z955 Presence of coronary angioplasty implant and graft: Secondary | ICD-10-CM | POA: Insufficient documentation

## 2019-04-08 DIAGNOSIS — Z8571 Personal history of Hodgkin lymphoma: Secondary | ICD-10-CM | POA: Insufficient documentation

## 2019-04-08 DIAGNOSIS — Z953 Presence of xenogenic heart valve: Secondary | ICD-10-CM | POA: Diagnosis not present

## 2019-04-08 DIAGNOSIS — Z950 Presence of cardiac pacemaker: Secondary | ICD-10-CM | POA: Insufficient documentation

## 2019-04-08 DIAGNOSIS — Z951 Presence of aortocoronary bypass graft: Secondary | ICD-10-CM | POA: Insufficient documentation

## 2019-04-08 DIAGNOSIS — Z885 Allergy status to narcotic agent status: Secondary | ICD-10-CM | POA: Insufficient documentation

## 2019-04-08 DIAGNOSIS — Z86000 Personal history of in-situ neoplasm of breast: Secondary | ICD-10-CM | POA: Insufficient documentation

## 2019-04-08 DIAGNOSIS — Z801 Family history of malignant neoplasm of trachea, bronchus and lung: Secondary | ICD-10-CM | POA: Insufficient documentation

## 2019-04-08 DIAGNOSIS — Z9013 Acquired absence of bilateral breasts and nipples: Secondary | ICD-10-CM | POA: Insufficient documentation

## 2019-04-08 DIAGNOSIS — Z923 Personal history of irradiation: Secondary | ICD-10-CM | POA: Insufficient documentation

## 2019-04-08 DIAGNOSIS — Z880 Allergy status to penicillin: Secondary | ICD-10-CM | POA: Insufficient documentation

## 2019-04-08 DIAGNOSIS — Z803 Family history of malignant neoplasm of breast: Secondary | ICD-10-CM | POA: Insufficient documentation

## 2019-04-08 DIAGNOSIS — Z833 Family history of diabetes mellitus: Secondary | ICD-10-CM | POA: Insufficient documentation

## 2019-04-08 DIAGNOSIS — Z8249 Family history of ischemic heart disease and other diseases of the circulatory system: Secondary | ICD-10-CM | POA: Insufficient documentation

## 2019-04-08 LAB — BASIC METABOLIC PANEL
Anion gap: 11 (ref 5–15)
BUN: 68 mg/dL — ABNORMAL HIGH (ref 8–23)
CO2: 27 mmol/L (ref 22–32)
Calcium: 10.6 mg/dL — ABNORMAL HIGH (ref 8.9–10.3)
Chloride: 99 mmol/L (ref 98–111)
Creatinine, Ser: 2.48 mg/dL — ABNORMAL HIGH (ref 0.44–1.00)
GFR calc Af Amer: 23 mL/min — ABNORMAL LOW (ref >60)
GFR calc non Af Amer: 20 mL/min — ABNORMAL LOW (ref >60)
Glucose, Bld: 185 mg/dL — ABNORMAL HIGH (ref 70–99)
Potassium: 4.9 mmol/L (ref 3.5–5.1)
Sodium: 137 mmol/L (ref 135–145)

## 2019-04-08 LAB — BRAIN NATRIURETIC PEPTIDE: B Natriuretic Peptide: 111.8 pg/mL — ABNORMAL HIGH (ref 0.0–100.0)

## 2019-04-08 NOTE — Patient Instructions (Addendum)
Labs were done Today. We will only contact you if something is wrong. NO CALL IS A GOOD CALL!!   Your Physician would like you to follow up with the clinic in 6 weeks. At the [facility], you and your health needs are our priority. As part of our continuing mission to provide you with exceptional heart care, we have created designated Provider Care Teams. These Care Teams include your primary Cardiologist (physician) and Advanced Practice Providers (APPs- Physician Assistants and Nurse Practitioners) who all work together to provide you with the care you need, when you need it.   You may see any of the following providers on your designated Care Team at your next follow up: Marland Kitchen Dr Glori Bickers . Dr [name] . Darrick Grinder, NP   Please be sure to bring in all your medications bottles to every appointment.

## 2019-04-08 NOTE — Progress Notes (Signed)
Date:  04/08/2019   ID:  [name] [name], [name] [name] 01/13/1951, MRN [id number]   Provider location: [address], [place] Type of Visit: Established patient  PCP:  [name], MD  Cardiologist:  No primary care provider on file. Primary HF: Dr. Aundra Dubin   History of Present Illness: [name] [name] is a 68 y.o. female who has a history of CAD s/p CABG in 12/05 and redo in 1/11, bioprosthetic AVR, diastolic CHF, and sick sinus syndrome s/p PCM presents for cardiology followup.  She had initial CABG with SVG-RCA in 12/05 followed by redo SVG-RCA and bioprosthetic AVR in 1/11.  She has had diastolic CHF and is on Lasix.  In 9/14, she had a CPX test.  This showed severe functional limitation with ischemic ECG changes (inferolateral ST depression and ST elevation in V1 and V2). She had chest pain.  She had R/LHC in 9/14.  This showed elevated left and right heart filling pressures, patent SVG-PDA, and 80% distal LM/80% ostial LAD stenosis.  She was not a candidate for redo CABG (had 2 prior sternotomies as well as chest radiation for Hodgkins lymphoma).  She had DES from left main into proximal LAD and will need long-term DAPT => Brilinta was used as she has been a poor responder to Plavix in the past.  She re-developed angina in 11/15 and was admitted with unstable angina.  LHC showed 80% ostial LM and patent SVG-RCA. She had DES to ostial LM. Echo in 1/18 showed EF 60-65%, normal bioprosthetic aortic valve, mild-moderate MR, PASP 35 mmHg.   She has chronic iron deficiency anemia thought to be due to a slow GI bleed.  Last EGD was unremarkable and a capsule endoscopy was also unremarkable.  She has had periodic transfusions.    She had bilateral mastectomy for DCIS in 50/09 without complication.  She has an insulin pump followed by an endocrinologist at [facility].   Given myalgias with statins, she was started on Praluent.  LDL is now excellent.    In the past she has had short  runs of atrial fibrillation.  She is not anticoagulated. Planned for Watchman placement, but given her nickel allergy, we were unable to place a Watchman.     Diagnosed with OSA, now on CPAP.   Given ongoing problems with exertional dyspnea and occasional chest pain, Cardiolite was repeated in 9/18 and showed inferior changes.  She underwent left/right heart cath in 9/18.  This showed elevated filling pressures, R>L.  There was 75+% ostial LCx stenosis.  However, there were 2 layers of stent from the mid left main into the ostial LAD, and LCx intervention was thought to be very risky.  It was decided to manage her medically.  Echo was repeated in 10/18 showing EF 55-60%, moderate to severe TR.    Given recurrent chest pain, she had ETT-Cardiolite done in 3/19.  This was a normal study with no evidence for ischemia or infarction.  Echo in 1/20 showed EF 55-60%, mild LVH, stable bioprosthetic aortic valve, moderate-severe TR.    Recently, she has been more short of breath with exertion and renal function has been worse.  At last appointment, she was noted to be volume overloaded.  She was set up for echo and RHC.  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  Avella in 8/20 showed primarily right-sided HF.  Cardiolite in 8/20 showed no ischemia or infarction.  Torsemide was increased and she cut back on sodium in her diet.   She returns today for followup of CHF and CAD.  Weight is down 2 lbs.  Breathing is a bit better but still short of breath with moderate exertion.  No dyspnea walking at a steady pace on flat ground.  She is able to use her treadmill at a low speed but is limited by hip pain.  She is very fatigued around noon most days, then gets better later in the day.   Rare atypical chest pain.  She chronically raises the head of her bed at night.   Labs (2/19): hgb 13.2 Labs @ [facility] (3/19): K 4.7, creatinine 1.4, ANA negative, RF negative Labs (4/19): K 3.9, creatinine 1.52 => 1.63 Labs (6/19): LDL 8, HDL 42, TGs 259 Labs (9/19): K 4.9, creatinine 1.65, hgb 13.4, plts 129, LDL 46,TGs 182 Labs (12/19): K 4.9, creatinine 2.07, hgb 12.4 Labs (3/20): K 4, creatinine 1.8 Labs (6/20): K 4.7, creatinine 1.47 Labs (8/20): creatinine 2.13 => 2.46 => 2.4, LDL 22  PMH: 1. Sick sinus syndrome s/p [employer] PCM.  2. HTN 3. H/o SBO 4. Renal cell carcinoma s/p right nephrectomy in 2008.  5. 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  Fibroscan in 11/14 showed advanced fibrosis concerning for cirrhosis. EGD in 11/14 showed no varices.  6. Diastolic CHF: Echo (1/51) with EF 60-65%, bioprosthetic aortic valve with mean gradient 15 mmHg, mild MR.  CPX (9/14): peak VO2 10.4, VE/VCO2 43.8, inferolateral ST depression and ST elevation in V1/V2 with severe dyspnea and chest pain, severe functional limitation with ischemic changes.  Echo (9/14) with EF 60-65%, normal RV size and systolic function, mild-moderate MR, bioprosthetic aortic valve normal, PA systolic pressure 51 mmHg, no evidence for pericardial constriction though exam incomplete for constriction.  Echo (11/15) with EF 55-60%, bioprosthetic aortic valve, mild-moderate MR, moderate TR, PA systolic pressure 42 mmHg.  - TEE (5/17) with EF 60-65%, normal bioprosthetic aortic valve with mean gradient 13 mmHg, normal RV size and systolic function. - Echo (1/18) with EF 60-65%, normal bioprosthetic aortic valve, mild-moderate MR, PASP 35 mmHg.  - Echo (10/18) with EF 55-60%, bioprosthetic aortic valve mean gradient 10 mmHg, MAC with mild mitral stenosis (mean gradient 7) and mild mitral regurgitation, moderate to severe TR, PASP 43 mmHg.  - RHC (9/18): mean RA 13, PA 44/18 mean 33, mean PCWP 19, CI 3.45, PVR 2.7 WU.  - Echo  (1/20): EF 55-60%, mild LVH, mild MR, stable bioprosthetic aortic valve, moderate-severe TR.  - RHC (8/20): mean RA 14, PA 43/15 mean 28, mean PCWP 13, PAPI 2, PVR 4.2, CI 2.31 - Echo (8/20): EF 55-60%, RV mildly dilated with normal systolic function, mildly D-shaped septum suggestive of RV pressure/volume overload, rheumatic-appearing mitral valve with mild mitral stenosis (mean gradient 6, MVA by PHT 4 cm^2), no evidence for pericardial constriction, moderate TR, bioprosthetic aortic valve functioning normally with mean gradient 6 mmHg.  7. TAH-BSO 8. Type I diabetes: Has insulin pump.  9. H/o ischemic colitis. 10. H/o PUD. 11. Diabetic gastroparesis. 12. Hyperlipidemia: Myalgias with Crestor and atorvastatin, elevated LFTs with simvastatin.  Myalgias with > 10 mg pravastatin. Now on Praluent.  13. CKD stage IV: Sees Dr. Lorrene Reid 14. Bicuspid aortic valve s/p 19 mm Edwards pericardial valve in 1/11 (had post-operative Dresslers syndrome).   15. CAD: CABG  with SVG-RCA in 12/05.  Redo SVG-RCA in 1/11 with AVR.  LHC/RHC (9/14) with mean RA 18, PA 62/25 mean 43, mean PCWP 26, CI 2.8, 70-80% distal LM stenosis, 80% ostial LAD stenosis, total occlusion RCA, SVG-PDA patent.  Patient had DES LM into LAD.  Plan to continue long-term ASA/Brilinta (poor responder to Plavix).  ETT-cardiolite (4/15) with 6' exercise, EF 60%, ST depression in recovery and mild chest pain, no ischemia or infarction by perfusion images.  Unstable angina 11/15 with 80% LM, patent SVG-RCA => DES to ostial LM.   - Lexiscan Cardiolite (2/18): EF 62%, no ischemia or infarction (normal).  She had a significant reaction to Torboy and probably should not get again.  - Cardiolite (9/18): EF 72%, prior inferior infarct with mild peri-infarct ischemia.  - LHC (9/18): 2 layers stent left main - ostial LAD with 50% dLM in-stent restenosis, 75+% ostial LCx, totally occluded RCA, SVG-RCA patent.  Medical management planned.  - ETT-Cardiolite  (4/19): EF normal, 7'11" exercise, no ischemia or infarction.  - Lexiscan Cardiolite (8/20): No ischemia/infarction.  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  17. Anemia: Iron-deficiency.  Suspect chronic GI blood loss.  EGD in 9/14 was unremarkable.  Capsule endoscopy 10/14 was unremarkable.  Has required periodic blood transfusions.  18. Atrial fibrillation: Paroxysmal, noted by [facility] interrogation.  19. Carotid stenosis: Carotid dopplers (2/15) with 40-59% bilateral stenosis. Carotid dopplers (3/16) with 40-59% bilateral ICA stenosis. Carotid dopplers (3/17) with 40-59% bilateral ICA stenosis.  - Carotid dopplers (3/18) with mild BICA stenosis.  20. 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  22. C difficile colitis 2017 23. Gout 24. ?TIA: head CT negative.  25. OSA: Moderate, uses CPAP.  26. Chronic thrombocytopenia 27. Hypercalcemia 28. 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  29. Tricuspid regurgitation: Moderate by 8/20 echo.   Current Outpatient Medications  Medication Sig Dispense Refill   acetaminophen (TYLENOL) 325 MG tablet Take 325-650 mg by mouth every 6 (six) hours as needed for moderate pain.     acyclovir ointment (ZOVIRAX) 5 % Apply 1 application topically every 3 (three) hours as needed (fever blister/cold sores).     ALPRAZolam (XANAX) 0.5 MG tablet Take 1 tablet (0.5 mg total) by mouth 2 (two) times daily as needed for anxiety. (Patient taking differently: Take 0.25-0.5 mg by mouth See admin instructions. Take 0.25 mg at bedtime, may take a additional 0.25-0.5 mg dose during the day as needed for anxiety) 60 tablet 1   amLODipine (NORVASC) 2.5 MG tablet TAKE ONE TABLET (2.5MG TOTAL) BY MOUTH DAILY (Patient taking differently: Take 2.5 mg by mouth daily. ) 90 tablet 1   APIDRA 100 UNIT/ML  injection Inject 40 Units into the skin as directed. INFUSE THROUGH INSULIN PUMP UTD, Bolus depends on carb intake  2   aspirin EC 81 MG tablet Take 81 mg by mouth at bedtime.     BRILINTA 60 MG TABS tablet TAKE ONE TABLET (60 MG TOTAL) BY MOUTH TWICE DAILY. (Patient taking differently: Take 60 mg by mouth 2 (two) times daily. May crush if patient unable to swallow whole tablet(s). Crush tab to a fine powder and pour into a half glass of water.  Stir and admin immediately. Rinse the empty glass with another half glass of water and administer it. Mixture can also be given via a nasogastric tube [facility] or greater). NG  tube must be flushed through with water after administration of the mixture. Maintenance doses of aspirin above 100 mg reduce the effectiveness of BRILINTA and should be avoided.) 180 tablet 2   clobetasol (OLUX) 0.05 % topical foam Apply 1 application topically 2 (two) times daily as needed (skin flaking).     Coenzyme Q10 (CO Q 10) 100 MG CAPS Take 100 mg by mouth at bedtime.      colchicine 0.6 MG tablet Take 0.6 mg by mouth daily as needed (gout flare).     docusate sodium (COLACE) 100 MG capsule Take 100 mg by mouth daily as needed for mild constipation.      famotidine (PEPCID) 40 MG tablet Take 40 mg by mouth See admin instructions. Take 40 mg at night, may take a second 40 mg during the day as needed for acid reflux     Fe Fum-FA-B Cmp-C-Zn-Mg-Mn-Cu (HEMATINIC PLUS VIT/MINERALS) 106-1 MG TABS TAKE 1 TABLET BY MOUTH THREE TIMES A WEEK(MONDAY, WEDNESDAY, AND FRIDAY) OR AS DIRECTED WITH A VITAMIN "C" (Patient taking differently: Take 1 tablet by mouth every Monday, Wednesday, and Friday. ) 90 tablet 0   fluticasone (FLONASE) 50 MCG/ACT nasal spray Place 2 sprays into both nostrils daily. (Patient taking differently: Place 2 sprays into both nostrils daily as needed for allergies. ) 48 g 3   glucagon (GLUCAGON EMERGENCY) 1 MG injection Inject 1 mg into the vein once as needed (low  blood sugar).      isosorbide mononitrate (IMDUR) 120 MG 24 hr tablet TAKE ONE TABLET (120MG TOTAL) BY MOUTH DAILY (Patient taking differently: Take 120 mg by mouth at bedtime. ) 90 tablet 3   latanoprost (XALATAN) 0.005 % ophthalmic solution Place 1 drop into both eyes at bedtime.     loratadine (CLARITIN) 10 MG tablet Take 10 mg by mouth daily as needed for allergies.     metolazone (ZAROXOLYN) 2.5 MG tablet Take 2.5 mg (1 tab) once as directed by CHF clinic. Call before taking [phone] (Patient taking differently: Take 2.5 mg by mouth daily as needed (weight gain). Take 2.5 mg (1 tab) once as directed by CHF clinic. Call before taking [phone]) 3 tablet 0   metoprolol tartrate (LOPRESSOR) 50 MG tablet TAKE ONE AND ONE-HALF TABLETS (75 MG) BYMOUTH TWICE DAILY. (Patient taking differently: Take 75 mg by mouth 2 (two) times daily. (BETA BLOCKER)) 270 tablet 2   mupirocin cream (BACTROBAN) 2 % Apply 1 application topically 2 (two) times daily as needed. (Patient taking differently: Apply 1 application topically 2 (two) times daily as needed (woud care). ) 90 g 0   nitroGLYCERIN (NITROLINGUAL) 0.4 MG/SPRAY spray PLACE 1 SPRAY UNDER THE TONGUE EVERY 5 MINUTES FOR 3 DOSES AS NEEDED FOR CHEST PAIN (Patient taking differently: Place 1 spray under the tongue every 5 (five) minutes x 3 doses as needed for chest pain. ) 14.7 g 0   omeprazole (PRILOSEC) 20 MG capsule Take 20 mg by mouth daily.     Polyethyl Glycol-Propyl Glycol (SYSTANE) 0.4-0.3 % SOLN Apply 1 drop to eye daily as needed (dry eyes).     PRALUENT 150 MG/ML SOAJ INJECT 150MG SUBCUTANEOUSLY EVERY 2 WEEKS (Patient taking differently: Inject 150 mg into the skin every 14 (fourteen) days. ) 6 mL 3   pravastatin (PRAVACHOL) 10 MG tablet TAKE ONE TABLET (10MG TOTAL) BY MOUTH DAILY 90 tablet 3   silver nitrate applicators 08-65 % applicator Use as needed to stop superficial skin bleeding. 10 each 0   spironolactone (  ALDACTONE) 25 MG  tablet Take 0.5 tablets (12.5 mg total) by mouth daily. 90 tablet 1   torsemide (DEMADEX) 20 MG tablet Take 3 tablets (60 mg total) by mouth daily. Take extra if needed for fluid (Patient taking differently: Take 60 mg by mouth daily. ) 90 tablet 6   ULORIC 40 MG tablet Take 1 tablet (40 mg total) by mouth at bedtime. 90 tablet 3   UNABLE TO FIND Med Name: So Clean CPAP cleaning system 1 each 0   VASCEPA 1 g CAPS TAKE 2 CAPSULES (2 GRAMS TOTAL) BY MOUTHTWO TIMES DAILY (Patient taking differently: Take 2 g by mouth 2 (two) times daily. ) 360 capsule 3   Wheat Dextrin (BENEFIBER) POWD Take 1 Dose by mouth daily.     No current facility-administered medications for this encounter.     Allergies:   Avandia [rosiglitazone maleate], Cephalexin, Clindamycin hcl, Humalog [insulin lispro], Lincomycin, Metformin, Novolog [insulin aspart], Penicillins, Rosiglitazone, Tizanidine, Adhesive [tape], Atorvastatin, Cholestyramine, Gentamycin [gentamicin], Iodinated diagnostic agents, Limonene, Pravastatin, Prednisone, Ranexa [ranolazine], Rosuvastatin, Simvastatin, Strawberry extract, Clopidogrel, Codeine, Erythromycin, Hydrocodone-acetaminophen, Latex, Neomycin, Nickel, Ranitidine, Tamiflu [oseltamivir], and Tramadol   Social History:  The patient  reports that she has never smoked. She has never used smokeless tobacco. She reports that she does not drink alcohol or use drugs.   Family History:  The patient's family history includes Breast cancer (age of onset: 27) in her sister; Cancer (age of onset: 97) in her sister; Cancer (age of onset: 15) in her maternal grandfather; Diabetes in her father; Glaucoma in her brother; Heart attack in her father, maternal grandmother, and mother; Heart disease in her father and mother; Hypertension in her brother; Lung cancer in her sister.   ROS:  Please see the history of present illness.   All other systems are personally reviewed and negative.   Exam:   BP (!) 118/56     Pulse 66    Wt 52 kg (114 lb 9.6 oz)    SpO2 99%    BMI 20.30 kg/m  General: NAD Neck: JVP 7-8 cm, no thyromegaly or thyroid nodule.  Lungs: Clear to auscultation bilaterally with normal respiratory effort. CV: Nondisplaced PMI.  Heart regular S1/S2, no S3/S4, 2/6 SEM RUSB.  No peripheral edema.  No carotid bruit.  Normal pedal pulses.  Abdomen: Soft, nontender, no hepatosplenomegaly, mild distention.  Skin: Intact without lesions or rashes.  Neurologic: Alert and oriented x 3.  Psych: Normal affect. Extremities: No clubbing or cyanosis.  HEENT: Normal.   Recent Labs: 12/31/2018: ALT 23 03/10/2019: Platelets 117 03/18/2019: Hemoglobin 11.9; Hemoglobin 11.9 04/08/2019: B Natriuretic Peptide 111.8; BUN 68; Creatinine, Ser 2.48; Potassium 4.9; Sodium 137  Personally reviewed   Wt Readings from Last 3 Encounters:  04/08/19 52 kg (114 lb 9.6 oz)  03/18/19 52.6 kg (116 lb)  03/05/19 52.6 kg (116 lb)      ASSESSMENT AND PLAN:  1. CAD: s/p SVG-RCA in 12/05, then redo SVG-RCA in 1/11-->distal left main/proximal LAD severe stenosis in 9/14. PCI with DES to distal LM/proximal LAD was done rather than bypass as she would have been a poor candidate for redo sternotomy (2 prior sternotomies and history of chest wall radiation for Hodgkins disease). Recurrent unstable angina with 80% ostial LM treated with repeat DES in 11/15.  Lexiscan Cardiolite in 9/18 inferior changes.  Repeat LHC (9/18) showed 50% distal left main in-stent restenosis and 75+% ostial LCx stenosis. I reviewed the films at the time with interventional cardiology =>  would be very difficult to intervene on the ostial LCx through 2 overlapping stents from left main into the LAD.  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  Given no ACS, we planned medical management and increased imdur.  Cardiolite in 3/19 and again in 8/20 showed no ischemia or infarction.  She only has rare atypical chest pain now.   Exertional dyspnea is much more prominent than chest pain.  - Continue Brilinta 60 mg bid (poor responder to plavix) long-term given recurrent left main disease.  - Continue ASA 81, statin/Praluent/Vascepa, metoprolol, and Imdur 120 mg daily.  - She was unable to tolerate ranolazine.  2. Bioprosthetic aortic valve replacement: In setting of bicuspid aortic valve, thoracic aorta not dilated on 2017 TEE. Valve looked ok on 8/20 echo.  3. Chronic diastolic CHF: Most recent echo in 8/20 showed normal LV systolic function, mildly dilated RV with normal systolic function, mild MS, moderate TR, normal bioprosthetic AoV, no evidence for pericardial constriction. RHC in 8/20 was concerning for right > left heart failure, she did appear to have equalization of diastolic pressures concerning for restrictive cardiomyopathy, possibly related to prior radiation. Recently symptomatically worse, also with worsening renal function.  I increased her torsemide last appointment, volume status looks better on exam and weight down but still NYHA class III symptoms. I am concerned that restrictive cardiomyopathy and renal dysfunction are going to be very difficult to deal with.  - Continue torsemide 60 mg daily.  BMET today.  - Continue spironolactone 12.5 mg daily.  - Continue to follow low K diet.  4. CKD: Patient has a single kidney s/p right nephrectomy and suspected diabetic nephropathy. CKD stage 4, slowly progressive.  Creatinine was > 2 recently.  This is concerning, as above, for worsening cardiorenal syndrome in the setting of RV > LV failure/restrictive cardiomyopathy.   - BMET today.   5. Hyperlipidemia: She has not been able to tolerate any higher potency statin than pravastatin 10 mg daily. Good lipids in 8/20.  - Continue pravastatin and Praluent.  - She is on Vascepa for elevated TGs.   6. Anemia: Chronic/slow GI bleed. Unremarkable EGD and capsule endoscopy in fall 2014. Unfortunately, needs to stay  on Brilinta long-term (Plavix non-responder). Rectal bleeding earlier this year thought to be diverticular, has now resolved.  Has followup with GI.  7. Atrial fibrillation: Paroxysmal, brief episodes noted on PCM interrogation. Given GI bleeding and need to be on Brilinta, risks have been thought to outweigh benefits of anticoagulation.  She is not a good Multaq candidate with CHF. If she needs an antiarrhythmic, consider Tikosyn or Sotalol but renal function may make these meds difficult to use.  No recent palpitations.  - We were unable to place Watchman device due to nickel allergy.  8. Carotid bruit: Only mild stenosis on last dopplers in 3/18.  9. SSS: Pacemaker in place.  10. HTN: controlled. Continue current regimen.  11. OSA: Moderate, using CPAP.  12. ?TIA: Transient dysarthria in 3/18, could have been afib-related TIA. No recurrent symptoms.  13. Thrombocytopenia: Chronic and mild. Follows with hematology.  14. 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  Based on 8/20 RHC and echo it appears mild.  15. Tricuspid regurgitation: Moderate on 8/20 echo.   Followup in 6 wks.    Signed, [name], MD  04/08/2019  [facility] [address] [facility] [place] [address] [phone] (office) [phone] (fax)

## 2019-04-13 ENCOUNTER — Inpatient Hospital Stay (HOSPITAL_COMMUNITY): Payer: Medicare Other | Attending: Hematology | Admitting: Hematology

## 2019-04-13 ENCOUNTER — Other Ambulatory Visit (HOSPITAL_COMMUNITY): Payer: Self-pay | Admitting: Cardiology

## 2019-04-13 ENCOUNTER — Other Ambulatory Visit: Payer: Self-pay

## 2019-04-13 ENCOUNTER — Inpatient Hospital Stay (HOSPITAL_COMMUNITY): Payer: Medicare Other

## 2019-04-13 ENCOUNTER — Encounter (HOSPITAL_COMMUNITY): Payer: Self-pay | Admitting: Hematology

## 2019-04-13 ENCOUNTER — Other Ambulatory Visit (HOSPITAL_COMMUNITY): Payer: Self-pay | Admitting: *Deleted

## 2019-04-13 DIAGNOSIS — R0602 Shortness of breath: Secondary | ICD-10-CM | POA: Insufficient documentation

## 2019-04-13 DIAGNOSIS — E119 Type 2 diabetes mellitus without complications: Secondary | ICD-10-CM | POA: Diagnosis not present

## 2019-04-13 DIAGNOSIS — Z9989 Dependence on other enabling machines and devices: Secondary | ICD-10-CM | POA: Diagnosis not present

## 2019-04-13 DIAGNOSIS — E1021 Type 1 diabetes mellitus with diabetic nephropathy: Secondary | ICD-10-CM

## 2019-04-13 DIAGNOSIS — D696 Thrombocytopenia, unspecified: Secondary | ICD-10-CM | POA: Diagnosis present

## 2019-04-13 DIAGNOSIS — G479 Sleep disorder, unspecified: Secondary | ICD-10-CM | POA: Insufficient documentation

## 2019-04-13 DIAGNOSIS — R5383 Other fatigue: Secondary | ICD-10-CM | POA: Diagnosis not present

## 2019-04-13 DIAGNOSIS — Z8571 Personal history of Hodgkin lymphoma: Secondary | ICD-10-CM | POA: Insufficient documentation

## 2019-04-13 DIAGNOSIS — E1122 Type 2 diabetes mellitus with diabetic chronic kidney disease: Secondary | ICD-10-CM | POA: Insufficient documentation

## 2019-04-13 DIAGNOSIS — Z86000 Personal history of in-situ neoplasm of breast: Secondary | ICD-10-CM | POA: Insufficient documentation

## 2019-04-13 DIAGNOSIS — G4733 Obstructive sleep apnea (adult) (pediatric): Secondary | ICD-10-CM | POA: Insufficient documentation

## 2019-04-13 DIAGNOSIS — Z79899 Other long term (current) drug therapy: Secondary | ICD-10-CM | POA: Insufficient documentation

## 2019-04-13 DIAGNOSIS — Z85528 Personal history of other malignant neoplasm of kidney: Secondary | ICD-10-CM | POA: Insufficient documentation

## 2019-04-13 DIAGNOSIS — I50810 Right heart failure, unspecified: Secondary | ICD-10-CM | POA: Insufficient documentation

## 2019-04-13 DIAGNOSIS — Z9012 Acquired absence of left breast and nipple: Secondary | ICD-10-CM | POA: Insufficient documentation

## 2019-04-13 DIAGNOSIS — D649 Anemia, unspecified: Secondary | ICD-10-CM

## 2019-04-13 DIAGNOSIS — I130 Hypertensive heart and chronic kidney disease with heart failure and stage 1 through stage 4 chronic kidney disease, or unspecified chronic kidney disease: Secondary | ICD-10-CM | POA: Insufficient documentation

## 2019-04-13 DIAGNOSIS — Z7982 Long term (current) use of aspirin: Secondary | ICD-10-CM | POA: Insufficient documentation

## 2019-04-13 DIAGNOSIS — M810 Age-related osteoporosis without current pathological fracture: Secondary | ICD-10-CM

## 2019-04-13 DIAGNOSIS — I5032 Chronic diastolic (congestive) heart failure: Secondary | ICD-10-CM | POA: Diagnosis not present

## 2019-04-13 LAB — COMPREHENSIVE METABOLIC PANEL
ALT: 27 U/L (ref 0–44)
AST: 28 U/L (ref 15–41)
Albumin: 5.2 g/dL — ABNORMAL HIGH (ref 3.5–5.0)
Alkaline Phosphatase: 138 U/L — ABNORMAL HIGH (ref 38–126)
Anion gap: 13 (ref 5–15)
BUN: 83 mg/dL — ABNORMAL HIGH (ref 8–23)
CO2: 28 mmol/L (ref 22–32)
Calcium: 10.6 mg/dL — ABNORMAL HIGH (ref 8.9–10.3)
Chloride: 96 mmol/L — ABNORMAL LOW (ref 98–111)
Creatinine, Ser: 2.74 mg/dL — ABNORMAL HIGH (ref 0.44–1.00)
GFR calc Af Amer: 20 mL/min — ABNORMAL LOW (ref >60)
GFR calc non Af Amer: 17 mL/min — ABNORMAL LOW (ref >60)
Glucose, Bld: 169 mg/dL — ABNORMAL HIGH (ref 70–99)
Potassium: 4.8 mmol/L (ref 3.5–5.1)
Sodium: 137 mmol/L (ref 135–145)
Total Bilirubin: 0.9 mg/dL (ref 0.3–1.2)
Total Protein: 9.1 g/dL — ABNORMAL HIGH (ref 6.5–8.1)

## 2019-04-13 LAB — CBC WITH DIFFERENTIAL/PLATELET
Abs Immature Granulocytes: 0.04 10*3/uL (ref 0.00–0.07)
Basophils Absolute: 0.1 10*3/uL (ref 0.0–0.1)
Basophils Relative: 1 %
Eosinophils Absolute: 0.1 10*3/uL (ref 0.0–0.5)
Eosinophils Relative: 1 %
HCT: 40.2 % (ref 36.0–46.0)
Hemoglobin: 12.8 g/dL (ref 12.0–15.0)
Immature Granulocytes: 0 %
Lymphocytes Relative: 13 %
Lymphs Abs: 1.3 10*3/uL (ref 0.7–4.0)
MCH: 29.3 pg (ref 26.0–34.0)
MCHC: 31.8 g/dL (ref 30.0–36.0)
MCV: 92 fL (ref 80.0–100.0)
Monocytes Absolute: 0.6 10*3/uL (ref 0.1–1.0)
Monocytes Relative: 6 %
Neutro Abs: 7.6 10*3/uL (ref 1.7–7.7)
Neutrophils Relative %: 79 %
Platelets: 126 10*3/uL — ABNORMAL LOW (ref 150–400)
RBC: 4.37 MIL/uL (ref 3.87–5.11)
RDW: 13 % (ref 11.5–15.5)
WBC: 9.7 10*3/uL (ref 4.0–10.5)
nRBC: 0 % (ref 0.0–0.2)

## 2019-04-13 LAB — T4, FREE: Free T4: 0.9 ng/dL (ref 0.61–1.12)

## 2019-04-13 LAB — LACTATE DEHYDROGENASE: LDH: 162 U/L (ref 98–192)

## 2019-04-13 LAB — TSH: TSH: 2.141 u[IU]/mL (ref 0.350–4.500)

## 2019-04-13 NOTE — Progress Notes (Signed)
[address] [place], [place] [address]   CLINIC:  Medical Oncology/Hematology  PCP:  [name], [place] [address] [phone]   REASON FOR VISIT: Follow-up for thrombocytopenia and mild hypercalcemia.  CURRENT THERAPY: observation   INTERVAL HISTORY:  Ms. Rochette 68 y.o. female seen for follow-up of thrombocytopenia and mild hypercalcemia and prior malignancies.  She apparently stopped taking vitamin D on 02/26/2019.  The dose of torsemide was increased to 60 mg daily in mid August for right heart failure.  Denies any new onset pains.  Appetite and energy levels are 50%.  Pain in the right hip and leg has been stable.  Numbness in the hands and feet is also stable.  Reports shortness of breath on exertion.    REVIEW OF SYSTEMS:  Review of Systems  Constitutional: Positive for fatigue.  Respiratory: Positive for shortness of breath.   Neurological: Positive for numbness.  Psychiatric/Behavioral: Positive for sleep disturbance.  All other systems reviewed and are negative.    PAST MEDICAL/SURGICAL HISTORY:  Past Medical History:  Diagnosis Date  . Anxiety   . Aortic stenosis    a. bicuspid aortic valve; mean gradient of 20 mmHg in 2/10; b. s/p bioprosth AVR (19-mm Edwards pericardial valve) with a redo coronary artery bypass graft procedure in 07/2009; postoperative Dressler's syndrome    . Arthritis   . Breast cancer ([place])   . Carcinoma, renal cell ([place]) 05/2007   Laparoscopic right nephrectomy  . 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 Chronic diastolic CHF (congestive heart failure) ([place])    a. 9.2014 EchoP EF 60-65%, no rwma, bioprosth AVR, mean gradient of 24mHg, mild to mod MR, PASP 531mg.  . CKD (chronic kidney disease), stage II   . Colitis, ischemic ([facility]2008  . Coronary artery disease    a. initial CABG with SVG-RCA in 12/05. b. redo SVG-RCA and bioprosthetic AVR in 1/11. d. 9/14 - PCI with DES  to distal LM/proximal LAD was done rather than bypass as she would have been a poor candidate for redo sternotomy. d. S/p DES to prox LM 05/2014.  . Diabetes mellitus 03/2010    TYPE II: Hemoglobin A1c of 7.4 in  03/2010; 8.4 in 06/2010; treated with insulin pump  . Duodenal ulcer    Remote; H. pylori positive  . Gastroparesis due to DM ([facility]10/04/2012  . Genetic testing 01/14/2017   Ms. Mcneice underwent genetic counseling and testing for hereditary cancer syndromes on 01/01/2017. Her results were negative for mutations in all 83 genes analyzed by [facility]'s 83-gene Common Hereditary Cancers Panel. Genes analyzed include: ALK, APC, ATM, AXIN2, BAP1, BARD1, BLM, BMPR1A, BRCA1, BRCA2, BRIP1, CASR, CDC73, CDH1, CDK4, CDKN1B, CDKN1C, CDKN2A, CEBPA, CHEK2, CTNNA1, DICER1, DIS3L2,   . Hodgkin's disease ([facility]1991   Mantle radiation therapy  . Hodgkin's disease ([facility]  . Hyperlipidemia   . Hyperplastic gastric polyp 06/23/2012  . Hypertension   . Iron deficiency anemia    a. 03/2013 EGD: essentially normal. Possible slow GIB.  . Migraines   . NASH (nonalcoholic steatohepatitis) 1999   -biopsy in 1999  . OSA on CPAP 04/24/2017   Moderate with AHI 22/hr now on CPAP at 18cm H2o  . Osteopenia     hip on DEXA in October 2007.  . Osteoporosis   . Oxygen deficiency   . Pacemaker    six sinus syndrome  . PAF (paroxysmal atrial fibrillation) ([facility]   a.  h/o brief episodes noted on ppm interrogation. b. given GI bleeding and need to be on both ASA 81 and Brilinta, risks likely outweigh benefits of anticoagulation.   . Renal insufficiency   . Small bowel obstruction (H[facility])    Recurrent; resolved after resection of a lipoma  . Syncope    a. H/o recurrent syncope with pauses on loop recorder s/p Medtronic pacemaker 07/2012.   Past Surgical History:  Procedure Laterality Date  . ABDOMINAL HYSTERECTOMY    . AORTIC VALVE REPLACEMENT  2011   Aortic valve replacement surgery with a 19 mm bioprosthetic  device post redo CABG January 2011.  Marland Kitchen BONE MARROW BIOPSY    . BREAST EXCISIONAL BIOPSY     benign, bilateral  . CARDIAC CATHETERIZATION    . CERVICAL BIOPSY    . COLONOSCOPY     Multiple with adenomatous polyps  . COLONOSCOPY  06/23/2012   Procedure: COLONOSCOPY;  Surgeon: Gatha Mayer, MD;  Location: [facility];  Service: Endoscopy;  Laterality: N/A;  . CORONARY ARTERY BYPASS GRAFT  2005, 2011   CABG-most recent SVG to RCA-12/05; RCA occluded with patent graft in 2006; Redo bypass in 07/2009  . ESOPHAGOGASTRODUODENOSCOPY    . ESOPHAGOGASTRODUODENOSCOPY  06/23/2012   Procedure: ESOPHAGOGASTRODUODENOSCOPY (EGD);  Surgeon: Gatha Mayer, MD;  Location: [facility];  Service: Endoscopy;  Laterality: N/A;  . ESOPHAGOGASTRODUODENOSCOPY N/A 04/06/2013   Procedure: ESOPHAGOGASTRODUODENOSCOPY (EGD);  Surgeon: Irene Shipper, MD;  Location: [name] ENDOSCOPY;  Service: Endoscopy;  Laterality: N/A;  . EXPLORATORY LAPAROTOMY W/ BOWEL RESECTION     Small bowel resection of lipoma  . GIVENS CAPSULE STUDY N/A 04/30/2013   Procedure: GIVENS CAPSULE STUDY;  Surgeon: Gatha Mayer, MD;  Location: [facility];  Service: Endoscopy;  Laterality: N/A;  . LEFT AND RIGHT HEART CATHETERIZATION WITH CORONARY ANGIOGRAM N/A 04/07/2013   Procedure: LEFT AND RIGHT HEART CATHETERIZATION WITH CORONARY ANGIOGRAM;  Surgeon: Larey Dresser, MD;  Location: [facility] CATH LAB;  Service: Cardiovascular;  Laterality: N/A;  . LEFT ATRIAL APPENDAGE OCCLUSION N/A 02/16/2016   Watchman not placed - nickel allergy  . LEFT HEART CATHETERIZATION WITH CORONARY/GRAFT ANGIOGRAM N/A 06/07/2014   Procedure: LEFT HEART CATHETERIZATION WITH Beatrix Fetters;  Surgeon: Burnell Blanks, MD;  Location: [facility] CATH LAB;  Service: Cardiovascular;  Laterality: N/A;  . LIVER BIOPSY    . LOOP RECORDER IMPLANT N/A 12/10/2011   Procedure: LOOP RECORDER IMPLANT;  Surgeon: Evans Lance, MD;  Location: [facility] CATH LAB;  Service: Cardiovascular;   Laterality: N/A;  . MALONEY DILATION  06/23/2012   Procedure: [name] DILATION;  Surgeon: Gatha Mayer, MD;  Location: [facility] ENDOSCOPY;  Service: Endoscopy;;  54 fr  . MASTECTOMY Left 05/17/2015   total   . NEPHRECTOMY  2008   Laparoscopic right nephrectomy, renal cell carcinoma  . PACEMAKER INSERTION  08/21/2012   Medtronic Adapta L dual-chamber pacemaker  . PERCUTANEOUS CORONARY STENT INTERVENTION (PCI-S) N/A 04/09/2013   Procedure: PERCUTANEOUS CORONARY STENT INTERVENTION (PCI-S);  Surgeon: Burnell Blanks, MD;  Location: [facility] CATH LAB;  Service: Cardiovascular;  Laterality: N/A;  . PERMANENT PACEMAKER INSERTION N/A 08/21/2012   Procedure: PERMANENT PACEMAKER INSERTION;  Surgeon: Evans Lance, MD;  Location: [facility] CATH LAB;  Service: Cardiovascular;  Laterality: N/A;  . RIGHT HEART CATH N/A 03/18/2019   Procedure: RIGHT HEART CATH;  Surgeon: Larey Dresser, MD;  Location: [facility] CV LAB;  Service: Cardiovascular;  Laterality: N/A;  . RIGHT HEART CATH AND CORONARY/GRAFT ANGIOGRAPHY N/A 04/19/2017  Procedure: RIGHT HEART CATH AND CORONARY/GRAFT ANGIOGRAPHY;  Surgeon: Larey Dresser, MD;  Location: [facility] CV LAB;  Service: Cardiovascular;  Laterality: N/A;  . TEE WITHOUT CARDIOVERSION N/A 12/07/2015   Procedure: TRANSESOPHAGEAL ECHOCARDIOGRAM (TEE);  Surgeon: Larey Dresser, MD;  Location: [facility];  Service: Cardiovascular;  Laterality: N/A;  . TOTAL ABDOMINAL HYSTERECTOMY W/ BILATERAL SALPINGOOPHORECTOMY  2000   endometriosis and ovarian cysts  . TOTAL MASTECTOMY Left 05/17/2015   Procedure: LEFT TOTAL MASTECTOMY;  Surgeon: Rolm Bookbinder, MD;  Location: [facility];  Service: General;  Laterality: Left;  . TUMOR EXCISION  1981   removal of Hodgkins lymphoma     SOCIAL HISTORY:  Social History   Socioeconomic History  . Marital status: Married    Spouse name: Not on file  . Number of children: 2  . Years of education: Not on file  . Highest education level: Not on  file  Occupational History  . Occupation: retired    Comment: elementary principal  Social Needs  . Financial resource strain: Not on file  . Food insecurity    Worry: Not on file    Inability: Not on file  . Transportation needs    Medical: Not on file    Non-medical: Not on file  Tobacco Use  . Smoking status: Never Smoker  . Smokeless tobacco: Never Used  Substance and Sexual Activity  . Alcohol use: No    Alcohol/week: 0.0 standard drinks  . Drug use: No  . Sexual activity: Yes  Lifestyle  . Physical activity    Days per week: Not on file    Minutes per session: Not on file  . Stress: Not on file  Relationships  . Social Herbalist on phone: Not on file    Gets together: Not on file    Attends religious service: Not on file    Active member of club or organization: Not on file    Attends meetings of clubs or organizations: Not on file    Relationship status: Not on file  . Intimate partner violence    Fear of current or ex partner: Not on file    Emotionally abused: Not on file    Physically abused: Not on file    Forced sexual activity: Not on file  Other Topics Concern  . Not on file  Social History Narrative   Retired [employer] principal, married    Started disability September 2013      Has one daughter in [place], works at [employer]. Has a son, Aaron Edelman, who is Retail buyer.   Eats all food groups.   Wear seatbelt.    Attends church. [place], [place]. [employer].     FAMILY HISTORY:  Family History  Problem Relation Age of Onset  . Heart attack Mother        d.61  . Heart disease Mother   . Heart attack Father        d.60  . Diabetes Father   . Heart disease Father   . Lung cancer Sister        d.61 metastases to brain. History of smoking. Maternal half-sister.  . Cancer Sister 30       unspecified gynecologic cancer (either cervical or ovarian) in her 5s.  Marland Kitchen Heart attack Maternal Grandmother   . Cancer Maternal Grandfather 20        d.52s oral cancer  . Hypertension Brother   . Glaucoma Brother   . Breast cancer  Sister 59       d.42 from metastatic disease. Paternal half-sister.  . Colon cancer Neg Hx   . Stomach cancer Neg Hx     CURRENT MEDICATIONS:  Outpatient Encounter Medications as of 04/13/2019  Medication Sig Note  . ALPRAZolam (XANAX) 0.5 MG tablet Take 1 tablet (0.5 mg total) by mouth 2 (two) times daily as needed for anxiety. (Patient taking differently: Take 0.25-0.5 mg by mouth See admin instructions. Take 0.25 mg at bedtime, may take a additional 0.25-0.5 mg dose during the day as needed for anxiety)   . amLODipine (NORVASC) 2.5 MG tablet TAKE ONE TABLET (2.5MG TOTAL) BY MOUTH DAILY   . APIDRA 100 UNIT/ML injection Inject 40 Units into the skin as directed. INFUSE THROUGH INSULIN PUMP UTD, [place] depends on carb intake   . aspirin EC 81 MG tablet Take 81 mg by mouth at bedtime.   Marland Kitchen BRILINTA 60 MG TABS tablet TAKE ONE TABLET (60 MG TOTAL) BY MOUTH TWICE DAILY. (Patient taking differently: Take 60 mg by mouth 2 (two) times daily. May crush if patient unable to swallow whole tablet(s). Crush tab to a fine powder and pour into a half glass of water.  Stir and admin immediately. Rinse the empty glass with another half glass of water and administer it. Mixture can also be given via a nasogastric tube [facility] or greater). NG tube must be flushed through with water after administration of the mixture. Maintenance doses of aspirin above 100 mg reduce the effectiveness of BRILINTA and should be avoided.)   . Coenzyme Q10 (CO Q 10) 100 MG CAPS Take 100 mg by mouth at bedtime.    . docusate sodium (COLACE) 100 MG capsule Take 100 mg by mouth daily as needed for mild constipation.    . Fe Fum-FA-B Cmp-C-Zn-Mg-Mn-Cu (HEMATINIC PLUS VIT/MINERALS) 106-1 MG TABS TAKE 1 TABLET BY MOUTH THREE TIMES A WEEK(MONDAY, WEDNESDAY, AND FRIDAY) OR AS DIRECTED WITH A VITAMIN "C" (Patient taking differently: Take 1 tablet by mouth every Monday,  Wednesday, and Friday. )   . isosorbide mononitrate (IMDUR) 120 MG 24 hr tablet TAKE ONE TABLET (120MG TOTAL) BY MOUTH DAILY (Patient taking differently: Take 120 mg by mouth at bedtime. )   . latanoprost (XALATAN) 0.005 % ophthalmic solution Place 1 drop into both eyes at bedtime.   . metoprolol tartrate (LOPRESSOR) 50 MG tablet TAKE ONE AND ONE-HALF TABLETS (75 MG) BYMOUTH TWICE DAILY. (Patient taking differently: Take 75 mg by mouth 2 (two) times daily. (BETA BLOCKER))   . omeprazole (PRILOSEC) 20 MG capsule Take 20 mg by mouth daily.   . polyethylene glycol (MIRALAX / GLYCOLAX) 17 g packet Take 17 g by mouth daily. 1 capful everyday   . PRALUENT 150 MG/ML SOAJ INJECT 150MG SUBCUTANEOUSLY EVERY 2 WEEKS (Patient taking differently: Inject 150 mg into the skin every 14 (fourteen) days. ) 03/16/2019: Every other Friday   . pravastatin (PRAVACHOL) 10 MG tablet TAKE ONE TABLET (10MG TOTAL) BY MOUTH DAILY   . spironolactone (ALDACTONE) 25 MG tablet Take 0.5 tablets (12.5 mg total) by mouth daily.   Marland Kitchen torsemide (DEMADEX) 20 MG tablet Take 3 tablets (60 mg total) by mouth daily. Take extra if needed for fluid (Patient taking differently: Take 60 mg by mouth daily. )   . ULORIC 40 MG tablet Take 1 tablet (40 mg total) by mouth at bedtime.   Marland Kitchen VASCEPA 1 g CAPS TAKE 2 CAPSULES (2 GRAMS TOTAL) BY MOUTHTWO TIMES DAILY (Patient taking differently: Take  2 g by mouth 2 (two) times daily. )   . Wheat Dextrin (BENEFIBER) POWD Take 1 Dose by mouth daily.   Marland Kitchen acyclovir ointment (ZOVIRAX) 5 % Apply 1 application topically every 3 (three) hours as needed (fever blister/cold sores).   . clobetasol (OLUX) 0.05 % topical foam Apply 1 application topically 2 (two) times daily as needed (skin flaking).   . colchicine 0.6 MG tablet Take 0.6 mg by mouth daily as needed (gout flare).   . CONTOUR NEXT TEST test strip USE TO TEST BLOOD SUGAR UP TO EIGHT TIMES A DAY   . fluticasone (FLONASE) 50 MCG/ACT nasal spray Place 2 sprays  into both nostrils daily. (Patient not taking: Reported on 04/13/2019)   . glucagon (GLUCAGON EMERGENCY) 1 MG injection Inject 1 mg into the vein once as needed (low blood sugar).    Marland Kitchen loratadine (CLARITIN) 10 MG tablet Take 10 mg by mouth daily as needed for allergies.   . metolazone (ZAROXOLYN) 2.5 MG tablet Take 2.5 mg (1 tab) once as directed by CHF clinic. Call before taking [phone] (Patient not taking: Reported on 04/13/2019)   . mupirocin cream (BACTROBAN) 2 % Apply 1 application topically 2 (two) times daily as needed. (Patient not taking: Reported on 04/13/2019)   . nitroGLYCERIN (NITROLINGUAL) 0.4 MG/SPRAY spray PLACE 1 SPRAY UNDER THE TONGUE EVERY 5 MINUTES FOR 3 DOSES AS NEEDED FOR CHEST PAIN (Patient not taking: No sig reported)   . Polyethyl Glycol-Propyl Glycol (SYSTANE) 0.4-0.3 % SOLN Apply 1 drop to eye daily as needed (dry eyes).   . silver nitrate applicators 36-14 % applicator Use as needed to stop superficial skin bleeding. (Patient not taking: Reported on 04/13/2019)   . UNABLE TO FIND Med Name: So Clean CPAP cleaning system (Patient not taking: Reported on 04/13/2019)   . [DISCONTINUED] acetaminophen (TYLENOL) 325 MG tablet Take 325-650 mg by mouth every 6 (six) hours as needed for moderate pain.   . [DISCONTINUED] famotidine (PEPCID) 40 MG tablet Take 40 mg by mouth See admin instructions. Take 40 mg at night, may take a second 40 mg during the day as needed for acid reflux    No facility-administered encounter medications on file as of 04/13/2019.     ALLERGIES:  Allergies  Allergen Reactions  . Avandia [Rosiglitazone Maleate] Other (See Comments)    Congestive heart failure  . Cephalexin Anaphylaxis, Swelling and Rash    Extremities swell , throat swelling   . Clindamycin Hcl Anaphylaxis and Shortness Of Breath  . Humalog [Insulin Lispro] Itching     big knots and whelp   . Lincomycin Anaphylaxis and Shortness Of Breath  . Metformin Other (See Comments)     Congestive heart failure  . Novolog [Insulin Aspart] Hives and Itching    Humalog & Novolog big knots and whelp    . Penicillins Anaphylaxis, Hives, Swelling and Rash    Did it involve swelling of the face/tongue/throat, SOB, or low BP? Yes Did it involve sudden or severe rash/hives, skin peeling, or any reaction on the inside of your mouth or nose? No Did you need to seek medical attention at a hospital or doctor's office? Yes When did it last happen?Childhood allergy  If all above answers are "NO", may proceed with cephalosporin use.   Marland Kitchen Rosiglitazone Other (See Comments)    Congestive heart failure  . Tizanidine Other (See Comments)    Dizziness, Mental Status Changes, Hallucination    . Adhesive [Tape] Other (See Comments)  Tears skin, Please use "paper" tape  . Atorvastatin Other (See Comments)    increased LFT's, no muscle weakness, leg pain Muscle and joint pain increased LFT's  Increased LFTs  . Cholestyramine Other (See Comments)    Liver Disorder Elevated LFTs    . Gentamycin [Gentamicin] Hives, Itching and Rash    Fine red bumps.  Reaction noted post loop recorder implant, where gentamycin was used for irrigation. Topical prep  . Iodinated Diagnostic Agents Other (See Comments)    PAIN DURING LYMPHANGIOGRAM '81, NO ALLERGY TO IV CONTRAST.Has had procedures with Iodine since without problems.   . Limonene Hives, Itching, Rash and Other (See Comments)    Reaction noted post loop recorder implant, where gentamycin was used for irrigation. Topical prep  . Pravastatin Other (See Comments)    In high doses (20-33m) causes muscle and joint pain  Per pt. "can only take in low doses".   . Prednisone Itching and Other (See Comments)    B/P went high, itching all over.  Tolerates with benadryl   . Ranexa [Ranolazine] Other (See Comments)    Nausea, dizziness, low blood sugar, tingling in right hand, "blood blisters" on tongue   . Rosuvastatin Nausea And Vomiting,  Nausea Only and Other (See Comments)    Muscle and joint pain & increased LFTs; tolerates Pravastatin 138m(max)  . Simvastatin Other (See Comments)    Increased LFT's   . Strawberry Extract Hives, Itching, Rash and Other (See Comments)    Reaction noted post loop recorder implant, where gentamycin was used for irrigation. Topical prep.  Can eat fresh strawberries   . Clopidogrel Other (See Comments)    Does not work for patient Medicine was not effective  . Codeine Nausea And Vomiting  . Erythromycin Nausea And Vomiting  . Hydrocodone-Acetaminophen Itching, Rash and Other (See Comments)    itching  . Latex Itching and Other (See Comments)    I"if wear gloves hands get red ,itchy no prioblem if other wear and touch me"  . Neomycin Rash and Other (See Comments)    Redness  . Nickel Itching  . Ranitidine Nausea Only  . Tamiflu [Oseltamivir] Nausea And Vomiting  . Tramadol Nausea Only     PHYSICAL EXAM:  ECOG Performance status: 1  Vitals:   04/13/19 1429  BP: 122/86  Pulse: 67  Resp: 18  Temp: (!) 97.5 F (36.4 C)  SpO2: 100%   Filed Weights   04/13/19 1429  Weight: 114 lb 3.2 oz (51.8 kg)    Physical Exam Constitutional:      Appearance: Normal appearance. She is normal weight.  Cardiovascular:     Rate and Rhythm: Normal rate and regular rhythm.     Heart sounds: Normal heart sounds.  Pulmonary:     Effort: Pulmonary effort is normal.     Breath sounds: Normal breath sounds.  Abdominal:     General: Bowel sounds are normal.     Palpations: Abdomen is soft.  Musculoskeletal: Normal range of motion.  Skin:    General: Skin is warm and dry.  Neurological:     Mental Status: She is alert and oriented to person, place, and time. Mental status is at baseline.  Psychiatric:        Mood and Affect: Mood normal.        Behavior: Behavior normal.        Thought Content: Thought content normal.        Judgment: Judgment normal.  LABORATORY DATA:  I have  reviewed the labs as listed.  CBC    Component Value Date/Time   WBC 9.7 04/13/2019 1336   RBC 4.37 04/13/2019 1336   HGB 12.8 04/13/2019 1336   HGB 13.4 12/13/2016 1427   HCT 40.2 04/13/2019 1336   HCT 40.3 12/13/2016 1427   PLT 126 (L) 04/13/2019 1336   PLT 107 (L) 12/13/2016 1427   MCV 92.0 04/13/2019 1336   MCV 88.8 12/13/2016 1427   MCH 29.3 04/13/2019 1336   MCHC 31.8 04/13/2019 1336   RDW 13.0 04/13/2019 1336   RDW 14.6 (H) 12/13/2016 1427   LYMPHSABS 1.3 04/13/2019 1336   LYMPHSABS 1.2 12/13/2016 1427   MONOABS 0.6 04/13/2019 1336   MONOABS 0.8 12/13/2016 1427   EOSABS 0.1 04/13/2019 1336   EOSABS 0.2 12/13/2016 1427   BASOSABS 0.1 04/13/2019 1336   BASOSABS 0.1 12/13/2016 1427   CMP Latest Ref Rng & Units 04/13/2019 04/08/2019 03/18/2019  Glucose 70 - 99 mg/dL 169(H) 185(H) -  BUN 8 - 23 mg/dL 83(H) 68(H) -  Creatinine 0.44 - 1.00 mg/dL 2.74(H) 2.48(H) -  Sodium 135 - 145 mmol/L 137 137 141  Potassium 3.5 - 5.1 mmol/L 4.8 4.9 4.5  Chloride 98 - 111 mmol/L 96(L) 99 -  CO2 22 - 32 mmol/L 28 27 -  Calcium 8.9 - 10.3 mg/dL 10.6(H) 10.6(H) -  Total Protein 6.5 - 8.1 g/dL 9.1(H) - -  Total Bilirubin 0.3 - 1.2 mg/dL 0.9 - -  Alkaline Phos 38 - 126 U/L 138(H) - -  AST 15 - 41 U/L 28 - -  ALT 0 - 44 U/L 27 - -       DIAGNOSTIC IMAGING:  I have independently reviewed the scans and discussed with the patient.     ASSESSMENT & PLAN:   Thrombocytopenia ([place]) 1.  Mild hypercalcemia: - Intact PTH was normal.  Non-hyperparathyroid hypercalcemia. -Vitamin D level was 75.  Calcitriol was 16.5.  PTH RP was less than 2. -SPEP, serum immunofixation were negative.  24-hour urine for free light chains and immunofixation were negative.  Free light chain ratio was mildly elevated secondary to CKD.  Bone scan was also negative. - She has not taken calcium supplements for last several years.  She was taking vitamin D3 2000 units daily.  However she stopped taking it on  02/26/2019. -We reviewed labs.  Calcium today is 10.6.  However her albumin is elevated at 5.2.  Mildly elevated calcium is secondary to elevated albumin. -We have sent myeloma labs from today which are pending.  She has worsening of creatinine to 2.74 today.  Her torsemide was increased to 60 mg in mid August.  She will be seen back in 4 months for follow-up.  2.  Mild thrombocytopenia: -Previous connective tissue disorder work-up was negative.  CT of the abdomen did not show splenomegaly.  Peripheral blood smear showed giant cells.  This was clinically consistent with immune mediated process. - Her platelet count today is 126.  No active intervention necessary.  3.  Osteoporosis: -Last DEXA scan on 12/25/2017 shows T score of -3.2.  She is seeing [facility]ocrin[facility].  She has not started on Prolia yet.  4.  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  She is not on tamoxifen. -Last mammogram in December 2019 of the right breast was benign.  5.  Stage IIb Hodgkin's disease: -She received mantle radiation in 1981.  She continues to be in remission  since then.  Does not have any B symptoms or palpable adenopathy.  6.  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  Last CT on 04/14/2018 shows stable 14 mm left renal lesion.   Total time spent is 25 minutes with more than 50% of the time spent face-to-face discussing test results, counseling and coordination of care.  Orders placed this encounter:  No orders of the defined types were placed in this encounter.     Derek Jack, MD [place] [phone]

## 2019-04-13 NOTE — Assessment & Plan Note (Addendum)
1.  Mild hypercalcemia: - Intact PTH was normal.  Non-hyperparathyroid hypercalcemia. -Vitamin D level was 75.  Calcitriol was 16.5.  PTH RP was less than 2. -SPEP, serum immunofixation were negative.  24-hour urine for free light chains and immunofixation were negative.  Free light chain ratio was mildly elevated secondary to CKD.  Bone scan was also negative. - She has not taken calcium supplements for last several years.  She was taking vitamin D3 2000 units daily.  However she stopped taking it on 02/26/2019. -We reviewed labs.  Calcium today is 10.6.  However her albumin is elevated at 5.2.  Mildly elevated calcium is secondary to elevated albumin. -We have sent myeloma labs from today which are pending.  She has worsening of creatinine to 2.74 today.  Her torsemide was increased to 60 mg in mid August.  She will be seen back in 4 months for follow-up.  2.  Mild thrombocytopenia: -Previous connective tissue disorder work-up was negative.  CT of the abdomen did not show splenomegaly.  Peripheral blood smear showed giant cells.  This was clinically consistent with immune mediated process. - Her platelet count today is 126.  No active intervention necessary.  3.  Osteoporosis: -Last DEXA scan on 12/25/2017 shows T score of -3.2.  She is seeing endocrinologist at [facility]  She has not started on Prolia yet.  4.  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  She is not on tamoxifen. -Last mammogram in December 2019 of the right breast was benign.  5.  Stage IIb Hodgkin's disease: -She received mantle radiation in 1981.  She continues to be in remission since then.  Does not have any B symptoms or palpable adenopathy.  6.  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  Last CT on 04/14/2018 shows stable 14 mm left renal lesion.

## 2019-04-13 NOTE — Progress Notes (Signed)
Orders received from [facility] Dr. [name].  TSH and T4, free.  Results will be faxed to Dr. Frederik Pear per her request.

## 2019-04-13 NOTE — Patient Instructions (Signed)
[facility] Discharge Instructions  You were seen today by Dr. Katragadda. He went over your recent lab results. He will see you back in 4 months for labs and follow up.   Thank you for choosing [facility] at [facility] to provide your oncology and hematology care.  To afford each patient quality time with our provider, please arrive at least 15 minutes before your scheduled appointment time.   If you have a lab appointment with the Cancer Center please come in thru the  Main Entrance and check in at the main information desk  You need to re-schedule your appointment should you arrive 10 or more minutes late.  We strive to give you quality time with our providers, and arriving late affects you and other patients whose appointments are after yours.  Also, if you no show three or more times for appointments you may be dismissed from the clinic at the providers discretion.     Again, thank you for choosing [facility].  Our hope is that these requests will decrease the amount of time that you wait before being seen by our physicians.       _____________________________________________________________  Should you have questions after your visit to [facility], please contact our office at [phone] between the hours of 8:00 a.m. and 4:30 p.m.  Voicemails left after 4:00 p.m. will not be returned until the following business day.  For prescription refill requests, have your pharmacy contact our office and allow 72 hours.    Cancer Center Support Programs:   > Cancer Support Group  2nd Tuesday of the month 1pm-2pm, Journey Room    

## 2019-04-14 ENCOUNTER — Other Ambulatory Visit: Payer: Self-pay | Admitting: Cardiology

## 2019-04-14 ENCOUNTER — Ambulatory Visit: Payer: Self-pay

## 2019-04-14 ENCOUNTER — Ambulatory Visit (INDEPENDENT_AMBULATORY_CARE_PROVIDER_SITE_OTHER): Payer: Medicare Other | Admitting: Orthopaedic Surgery

## 2019-04-14 ENCOUNTER — Encounter (HOSPITAL_COMMUNITY): Payer: Self-pay | Admitting: *Deleted

## 2019-04-14 ENCOUNTER — Encounter: Payer: Self-pay | Admitting: Orthopaedic Surgery

## 2019-04-14 DIAGNOSIS — M25551 Pain in right hip: Secondary | ICD-10-CM | POA: Diagnosis not present

## 2019-04-14 LAB — KAPPA/LAMBDA LIGHT CHAINS
Kappa free light chain: 104.1 mg/L — ABNORMAL HIGH (ref 3.3–19.4)
Kappa, lambda light chain ratio: 2.43 — ABNORMAL HIGH (ref 0.26–1.65)
Lambda free light chains: 42.8 mg/L — ABNORMAL HIGH (ref 5.7–26.3)

## 2019-04-14 LAB — VITAMIN D 25 HYDROXY (VIT D DEFICIENCY, FRACTURES): Vit D, 25-Hydroxy: 73.2 ng/mL (ref 30.0–100.0)

## 2019-04-14 MED ORDER — DICLOFENAC SODIUM 1 % TD GEL: 2.0000 g | Freq: Four times a day (QID) | TRANSDERMAL | 6 refills | Status: DC

## 2019-04-14 MED ORDER — NITROGLYCERIN 0.2 MG/HR TD PT24: MEDICATED_PATCH | TRANSDERMAL | 12 refills | Status: DC

## 2019-04-14 NOTE — Progress Notes (Signed)
Office Visit Note   Patient: [name] [name]           Date of Birth: 1951/02/14           MRN: [id number] Visit Date: 04/14/2019              Requested by: [name], MD [place],  [place] [address] PCP: [name], MD   Assessment & Plan: Visit Diagnoses:  1. Right hip pain     Plan: Impression is right hip pain due to trochanteric bursitis versus abductor tendinosis.  I have prescribed her Voltaren gel as well as nitro patches to try.  Prescription for PT as well.  Questions encouraged and answered.  Follow-up as needed.  Follow-Up Instructions: Return if symptoms worsen or fail to improve.   Orders:  Orders Placed This Encounter  Procedures  . XR HIP UNILAT W OR W/O PELVIS 2-3 VIEWS RIGHT  . XR Lumbar Spine 2-3 Views   Meds ordered this encounter  Medications  . diclofenac sodium (VOLTAREN) 1 % GEL    Sig: Apply 2 g topically 4 (four) times daily.    Dispense:  300 g    Refill:  6  . nitroGLYCERIN (NITRODUR - DOSED IN MG/24 HR) 0.2 mg/hr patch    Sig: Apply 1/4 patch to area daily prn    Dispense:  30 patch    Refill:  12      Procedures: No procedures performed   Clinical Data: No additional findings.   Subjective: Chief Complaint  Patient presents with  . Right Leg - Pain    Geraline is a very pleasant 68 year old female who comes in for evaluation of right hip pain for about a month.  Fortunately the severe pain has already subsided.  She states that she has lateral hip pain without any consistent radiation.  Denies any consistent groin pain or back pain.  She denies any injuries.  Denies any low back pain or buttock pain.  She is medically complex and cannot take [name] NSAIDs or prednisone.   Review of Systems  Constitutional: Negative.   HENT: Negative.   Eyes: Negative.   Respiratory: Negative.   Cardiovascular: Negative.   Endocrine: Negative.   Musculoskeletal: Negative.   Neurological: Negative.   Hematological:  Negative.   Psychiatric/Behavioral: Negative.   All other systems reviewed and are negative.    Objective: Vital Signs: There were no vitals taken for this visit.  Physical Exam Vitals signs and nursing note reviewed.  Constitutional:      Appearance: She is well-developed.  HENT:     Head: Normocephalic and atraumatic.  Neck:     Musculoskeletal: Neck supple.  Pulmonary:     Effort: Pulmonary effort is normal.  Abdominal:     Palpations: Abdomen is soft.  Skin:    General: Skin is warm.     Capillary Refill: Capillary refill takes less than 2 seconds.  Neurological:     Mental Status: She is alert and oriented to person, place, and time.  Psychiatric:        Behavior: Behavior normal.        Thought Content: Thought content normal.        Judgment: Judgment normal.     Ortho Exam Right hip exam shows full range of motion without pain in the groin or back.  She is slightly tender to the greater trochanter.  She has mild discomfort with resisted hip abduction. Specialty Comments:  No specialty  comments available.  Imaging: Xr Hip Unilat W Or W/o Pelvis 2-3 Views Right  Result Date: 04/14/2019 Mild hip osteoarthritis without any significant degenerative changes.  Xr Lumbar Spine 2-3 Views  Result Date: 04/14/2019 No significant degenerative disc disease.  Normal lumbar lordosis.    PMFS History: Patient Active Problem List   Diagnosis Date Noted  . Right hip pain 04/14/2019  . OSA on CPAP 04/24/2017  . Genetic testing 01/14/2017  . Insulin pump status 09/12/2016  . S/P left mastectomy 07/29/2016  . History of breast cancer 03/29/2016  . Dense breast 12/11/2015  . Chronic anticoagulation 12/11/2015  . Hx of radiation therapy 04/23/2015  . Thrombocytopenia ([place]) 04/23/2015  . 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 Radiation therapy complication 17/00/1749  . History of  nodular sclerosis Hodgkin's disease 10/27/2014  . Insulin dependent type 1 diabetes mellitus ([place]) 10/07/2014  . Claudication ([place]) 07/29/2014  . PAF (paroxysmal atrial fibrillation) ([place])   . Coronary artery disease involving native coronary artery of native heart with unstable angina pectoris ([place])   . Unstable angina ([place]) 04/11/2013  . Chronic diastolic CHF (congestive heart failure) ([place]) 04/03/2013  . Artificial cardiac pacemaker 03/12/2013  . Chronic kidney disease (CKD), stage III (moderate) ([place]) 03/12/2013  . Big thyroid 03/12/2013  . Encounter for fitting or adjustment of insulin pump 03/12/2013  . Congestive heart failure ([place]) 03/12/2013  . Lipoma of neck 09/23/2012  . Pacemaker 09/08/2012  . Atrial flutter ([place]) 07/23/2012  . Sick sinus syndrome ([place]) 07/21/2012  . Gastroparesis due to DM ([place]) 05/01/2012  . Hypercalcemia   . Cholelithiasis 07/20/2011  . Aortic stenosis status post 19 mm Edwards pericardial valve replacement 2011   . Arteriosclerotic cardiovascular disease (ASCVD)   . Osteopenia   . NASH (nonalcoholic steatohepatitis)   . Renal cell carcinoma (HCC)     Class: History of  . Duodenal ulcer     Class: History of  . Ischemic colitis     Class: History of  . Gastroesophageal reflux disease   . Hypertension   . Small bowel obstruction ([place])   . Hyperlipidemia 04/11/2010  . Colonic polyps, adenomatous 09/29/2009   Past Medical History:  Diagnosis Date  . Anxiety   . Aortic stenosis    a. bicuspid aortic valve; mean gradient of 20 mmHg in 2/10; b. s/p bioprosth AVR (19-mm Edwards pericardial valve) with a redo coronary artery bypass graft procedure in 07/2009; postoperative Dressler's syndrome    . Arthritis   . Breast cancer ([place])   . Carcinoma, renal cell ([place]) 05/2007   Laparoscopic right nephrectomy  . 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 Chronic diastolic CHF (congestive heart failure) ([place])    a. 9.2014 EchoP EF 60-65%, no rwma,  bioprosth AVR, mean gradient of 24mHg, mild to mod MR, PASP 519mg.  . CKD (chronic kidney disease), stage II   . Colitis, ischemic ([place]2008  . Coronary artery disease    a. initial CABG with SVG-RCA in 12/05. b. redo SVG-RCA and bioprosthetic AVR in 1/11. d. 9/14 - PCI with DES to distal LM/proximal LAD was done rather than bypass as she would have been a poor candidate for redo sternotomy. d. S/p DES to prox LM 05/2014.  . Diabetes mellitus 03/2010    TYPE II: Hemoglobin A1c of 7.4 in  03/2010; 8.4 in 06/2010; treated with insulin pump  . Duodenal ulcer  Remote; H. pylori positive  . Gastroparesis due to DM ([place]) 05/01/2012  . Genetic testing 01/14/2017   Ms. Wolaver underwent genetic counseling and testing for hereditary cancer syndromes on 01/01/2017. Her results were negative for mutations in all 83 genes analyzed by [facility]'s 83-gene Common Hereditary Cancers Panel. Genes analyzed include: ALK, APC, ATM, AXIN2, BAP1, BARD1, BLM, BMPR1A, BRCA1, BRCA2, BRIP1, CASR, CDC73, CDH1, CDK4, CDKN1B, CDKN1C, CDKN2A, CEBPA, CHEK2, CTNNA1, DICER1, DIS3L2,   . Hodgkin's disease ([place]) 1991   Mantle radiation therapy  . Hodgkin's disease ([place])   . Hyperlipidemia   . Hyperplastic gastric polyp 06/23/2012  . Hypertension   . Iron deficiency anemia    a. 03/2013 EGD: essentially normal. Possible slow GIB.  . Migraines   . NASH (nonalcoholic steatohepatitis) 1999   -biopsy in 1999  . OSA on CPAP 04/24/2017   Moderate with AHI 22/hr now on CPAP at 18cm H2o  . Osteopenia     hip on DEXA in October 2007.  . Osteoporosis   . Oxygen deficiency   . Pacemaker    six sinus syndrome  . PAF (paroxysmal atrial fibrillation) ([place])    a.  h/o brief episodes noted on ppm interrogation. b. given GI bleeding and need to be on both ASA 81 and Brilinta, risks likely outweigh benefits of anticoagulation.   . Renal insufficiency   . Small bowel obstruction (HCC)    Recurrent; resolved after resection of a lipoma   . Syncope    a. H/o recurrent syncope with pauses on loop recorder s/p Medtronic pacemaker 07/2012.    Family History  Problem Relation Age of Onset  . Heart attack Mother        d.61  . Heart disease Mother   . Heart attack Father        d.60  . Diabetes Father   . Heart disease Father   . Lung cancer Sister        d.61 metastases to brain. History of smoking. Maternal half-sister.  . Cancer Sister 30       unspecified gynecologic cancer (either cervical or ovarian) in her 9s.  Marland Kitchen Heart attack Maternal Grandmother   . Cancer Maternal Grandfather 61       d.52s [name] cancer  . Hypertension Brother   . Glaucoma Brother   . Breast cancer Sister 85       d.42 from metastatic disease. Paternal half-sister.  . Colon cancer Neg Hx   . Stomach cancer Neg Hx     Past Surgical History:  Procedure Laterality Date  . ABDOMINAL HYSTERECTOMY    . AORTIC VALVE REPLACEMENT  2011   Aortic valve replacement surgery with a 19 mm bioprosthetic device post redo CABG January 2011.  Marland Kitchen BONE MARROW BIOPSY    . BREAST EXCISIONAL BIOPSY     benign, bilateral  . CARDIAC CATHETERIZATION    . CERVICAL BIOPSY    . COLONOSCOPY     Multiple with adenomatous polyps  . COLONOSCOPY  06/23/2012   Procedure: COLONOSCOPY;  Surgeon: Gatha Mayer, MD;  Location: [facility] ENDOSCOPY;  Service: Endoscopy;  Laterality: N/A;  . CORONARY ARTERY BYPASS GRAFT  2005, 2011   CABG-most recent SVG to RCA-12/05; RCA occluded with patent graft in 2006; Redo bypass in 07/2009  . ESOPHAGOGASTRODUODENOSCOPY    . ESOPHAGOGASTRODUODENOSCOPY  06/23/2012   Procedure: ESOPHAGOGASTRODUODENOSCOPY (EGD);  Surgeon: Gatha Mayer, MD;  Location: [facility];  Service: Endoscopy;  Laterality: N/A;  . ESOPHAGOGASTRODUODENOSCOPY N/A 04/06/2013  Procedure: ESOPHAGOGASTRODUODENOSCOPY (EGD);  Surgeon: Irene Shipper, MD;  Location: [facility] ENDOSCOPY;  Service: Endoscopy;  Laterality: N/A;  . EXPLORATORY LAPAROTOMY W/ BOWEL RESECTION     Small bowel  resection of lipoma  . GIVENS CAPSULE STUDY N/A 04/30/2013   Procedure: GIVENS CAPSULE STUDY;  Surgeon: Gatha Mayer, MD;  Location: [place];  Service: Endoscopy;  Laterality: N/A;  . LEFT AND RIGHT HEART CATHETERIZATION WITH CORONARY ANGIOGRAM N/A 04/07/2013   Procedure: LEFT AND RIGHT HEART CATHETERIZATION WITH CORONARY ANGIOGRAM;  Surgeon: Larey Dresser, MD;  Location: [facility] CATH LAB;  Service: Cardiovascular;  Laterality: N/A;  . LEFT ATRIAL APPENDAGE OCCLUSION N/A 02/16/2016   Watchman not placed - nickel allergy  . LEFT HEART CATHETERIZATION WITH CORONARY/GRAFT ANGIOGRAM N/A 06/07/2014   Procedure: LEFT HEART CATHETERIZATION WITH Beatrix Fetters;  Surgeon: Burnell Blanks, MD;  Location: [facility] CATH LAB;  Service: Cardiovascular;  Laterality: N/A;  . LIVER BIOPSY    . LOOP RECORDER IMPLANT N/A 12/10/2011   Procedure: LOOP RECORDER IMPLANT;  Surgeon: Evans Lance, MD;  Location: [facility] CATH LAB;  Service: Cardiovascular;  Laterality: N/A;  . MALONEY DILATION  06/23/2012   Procedure: [name] DILATION;  Surgeon: Gatha Mayer, MD;  Location: [facility] ENDOSCOPY;  Service: Endoscopy;;  54 fr  . MASTECTOMY Left 05/17/2015   total   . NEPHRECTOMY  2008   Laparoscopic right nephrectomy, renal cell carcinoma  . PACEMAKER INSERTION  08/21/2012   Medtronic Adapta L dual-chamber pacemaker  . PERCUTANEOUS CORONARY STENT INTERVENTION (PCI-S) N/A 04/09/2013   Procedure: PERCUTANEOUS CORONARY STENT INTERVENTION (PCI-S);  Surgeon: Burnell Blanks, MD;  Location: [facility] CATH LAB;  Service: Cardiovascular;  Laterality: N/A;  . PERMANENT PACEMAKER INSERTION N/A 08/21/2012   Procedure: PERMANENT PACEMAKER INSERTION;  Surgeon: Evans Lance, MD;  Location: [facility] CATH LAB;  Service: Cardiovascular;  Laterality: N/A;  . RIGHT HEART CATH N/A 03/18/2019   Procedure: RIGHT HEART CATH;  Surgeon: Larey Dresser, MD;  Location: [place] CV LAB;  Service: Cardiovascular;  Laterality: N/A;  . RIGHT HEART  CATH AND CORONARY/GRAFT ANGIOGRAPHY N/A 04/19/2017   Procedure: RIGHT HEART CATH AND CORONARY/GRAFT ANGIOGRAPHY;  Surgeon: Larey Dresser, MD;  Location: [place] CV LAB;  Service: Cardiovascular;  Laterality: N/A;  . TEE WITHOUT CARDIOVERSION N/A 12/07/2015   Procedure: TRANSESOPHAGEAL ECHOCARDIOGRAM (TEE);  Surgeon: Larey Dresser, MD;  Location: [place];  Service: Cardiovascular;  Laterality: N/A;  . TOTAL ABDOMINAL HYSTERECTOMY W/ BILATERAL SALPINGOOPHORECTOMY  2000   endometriosis and ovarian cysts  . TOTAL MASTECTOMY Left 05/17/2015   Procedure: LEFT TOTAL MASTECTOMY;  Surgeon: Rolm Bookbinder, MD;  Location: [place];  Service: General;  Laterality: Left;  . TUMOR EXCISION  1981   removal of Hodgkins lymphoma   Social History   Occupational History  . Occupation: retired    Comment: elementary principal  Tobacco Use  . Smoking status: Never Smoker  . Smokeless tobacco: Never Used  Substance and Sexual Activity  . Alcohol use: No    Alcohol/week: 0.0 standard drinks  . Drug use: No  . Sexual activity: Yes

## 2019-04-14 NOTE — Progress Notes (Signed)
TSH and T4, free results faxed to Dr. Frederik Pear at [facility] per their orders.

## 2019-04-15 LAB — PROTEIN ELECTROPHORESIS, SERUM
A/G Ratio: 1.1 (ref 0.7–1.7)
Albumin ELP: 4.7 g/dL — ABNORMAL HIGH (ref 2.9–4.4)
Alpha-1-Globulin: 0.3 g/dL (ref 0.0–0.4)
Alpha-2-Globulin: 1 g/dL (ref 0.4–1.0)
Beta Globulin: 1.2 g/dL (ref 0.7–1.3)
Gamma Globulin: 1.6 g/dL (ref 0.4–1.8)
Globulin, Total: 4.1 g/dL — ABNORMAL HIGH (ref 2.2–3.9)
Total Protein ELP: 8.8 g/dL — ABNORMAL HIGH (ref 6.0–8.5)

## 2019-04-15 LAB — IMMUNOFIXATION ELECTROPHORESIS
IgA: 328 mg/dL (ref 87–352)
IgG (Immunoglobin G), Serum: 1418 mg/dL (ref 586–1602)
IgM (Immunoglobulin M), Srm: 226 mg/dL — ABNORMAL HIGH (ref 26–217)
Total Protein ELP: 8.5 g/dL (ref 6.0–8.5)

## 2019-04-22 ENCOUNTER — Telehealth (HOSPITAL_COMMUNITY): Payer: Self-pay

## 2019-04-22 ENCOUNTER — Telehealth (HOSPITAL_COMMUNITY): Payer: Self-pay | Admitting: *Deleted

## 2019-04-22 MED ORDER — TORSEMIDE 20 MG PO TABS: 40.0000 mg | ORAL_TABLET | Freq: Every day | ORAL | 3 refills | Status: DC

## 2019-04-22 MED ORDER — TORSEMIDE 20 MG PO TABS: 40.0000 mg | ORAL_TABLET | Freq: Every day | ORAL | 6 refills | Status: DC

## 2019-04-22 NOTE — Telephone Encounter (Signed)
Pt aware of medication changes.  Pt also advised to monitor sodium intake. Pt states that she has been vigilant with sodium intake. Pt reports that she is also going to nutritionist to help with managing diet.  She is in touch with  Nephrology to move her appt up from November.

## 2019-04-22 NOTE — Telephone Encounter (Signed)
Creatinine higher again, decrease torsemide back to 40 mg daily and be very careful with sodium intake.  Make sure she has followup with nephrology.

## 2019-04-22 NOTE — Telephone Encounter (Signed)
Pt called asking for MD to review her labs as her creatinine is getting higher and she is concerned.   She asked if any medications should be adjusted. Routed to MD

## 2019-04-22 NOTE — Addendum Note (Signed)
Addended by: Valeda Malm on: 04/22/2019 11:36 AM   Modules accepted: Orders

## 2019-04-27 ENCOUNTER — Other Ambulatory Visit (HOSPITAL_COMMUNITY): Payer: Self-pay | Admitting: Cardiology

## 2019-05-07 ENCOUNTER — Telehealth (HOSPITAL_COMMUNITY): Payer: Self-pay | Admitting: Hematology

## 2019-05-07 NOTE — Telephone Encounter (Signed)
Pt left message questioning how charges were billed. Returned call had to leave a vm. Advised pt to call billing @ [phone].

## 2019-05-12 ENCOUNTER — Other Ambulatory Visit (HOSPITAL_COMMUNITY): Payer: Self-pay

## 2019-05-12 ENCOUNTER — Other Ambulatory Visit (HOSPITAL_COMMUNITY): Payer: Self-pay | Admitting: Cardiology

## 2019-05-12 DIAGNOSIS — I6523 Occlusion and stenosis of bilateral carotid arteries: Secondary | ICD-10-CM

## 2019-05-12 MED ORDER — TICAGRELOR 60 MG PO TABS: 60.0000 mg | ORAL_TABLET | Freq: Two times a day (BID) | ORAL | 0 refills | Status: DC

## 2019-05-15 ENCOUNTER — Telehealth (HOSPITAL_COMMUNITY): Payer: Self-pay | Admitting: Cardiology

## 2019-05-15 ENCOUNTER — Other Ambulatory Visit (HOSPITAL_COMMUNITY): Payer: Self-pay | Admitting: Cardiology

## 2019-05-15 DIAGNOSIS — I6523 Occlusion and stenosis of bilateral carotid arteries: Secondary | ICD-10-CM

## 2019-05-15 NOTE — Telephone Encounter (Signed)
°*  STAT* If patient is at the pharmacy, call can be transferred to refill team.   1. Which medications need to be refilled? (please list name of each medication and dose if known)  ticagrelor (BRILINTA) 60 MG TABS tablet  2. Which pharmacy/location (including street and city if local pharmacy) is medication to be sent to?  [employer]  3. Do they need a 30 day or 90 day supply? [facility] states they never got a new rx for this medication. If the office could send over a new rx they would appreciate it

## 2019-05-15 NOTE — Telephone Encounter (Signed)
This is a CHF pt 

## 2019-05-18 ENCOUNTER — Telehealth: Payer: Self-pay | Admitting: *Deleted

## 2019-05-18 NOTE — Telephone Encounter (Signed)

## 2019-05-22 ENCOUNTER — Ambulatory Visit (HOSPITAL_COMMUNITY)
Admission: RE | Admit: 2019-05-22 | Discharge: 2019-05-22 | Disposition: A | Discharge status: Home / Self Care | Payer: Medicare Other | Source: Ambulatory Visit | Attending: Cardiology | Admitting: Cardiology

## 2019-05-22 ENCOUNTER — Other Ambulatory Visit: Payer: Self-pay

## 2019-05-22 ENCOUNTER — Other Ambulatory Visit (HOSPITAL_COMMUNITY)
Admission: RE | Admit: 2019-05-22 | Discharge: 2019-05-22 | Disposition: A | Discharge status: Home / Self Care | Payer: Medicare Other | Source: Ambulatory Visit | Attending: Cardiology | Admitting: Cardiology

## 2019-05-22 ENCOUNTER — Encounter (HOSPITAL_COMMUNITY): Payer: Self-pay | Admitting: Cardiology

## 2019-05-22 VITALS — BP 109/59 | HR 69 | Wt 112.4 lb

## 2019-05-22 DIAGNOSIS — Z8249 Family history of ischemic heart disease and other diseases of the circulatory system: Secondary | ICD-10-CM | POA: Insufficient documentation

## 2019-05-22 DIAGNOSIS — Z20828 Contact with and (suspected) exposure to other viral communicable diseases: Secondary | ICD-10-CM | POA: Insufficient documentation

## 2019-05-22 DIAGNOSIS — I25119 Atherosclerotic heart disease of native coronary artery with unspecified angina pectoris: Secondary | ICD-10-CM | POA: Insufficient documentation

## 2019-05-22 DIAGNOSIS — M109 Gout, unspecified: Secondary | ICD-10-CM | POA: Diagnosis not present

## 2019-05-22 DIAGNOSIS — N184 Chronic kidney disease, stage 4 (severe): Secondary | ICD-10-CM | POA: Insufficient documentation

## 2019-05-22 DIAGNOSIS — Z794 Long term (current) use of insulin: Secondary | ICD-10-CM | POA: Insufficient documentation

## 2019-05-22 DIAGNOSIS — Q231 Congenital insufficiency of aortic valve: Secondary | ICD-10-CM | POA: Diagnosis not present

## 2019-05-22 DIAGNOSIS — D509 Iron deficiency anemia, unspecified: Secondary | ICD-10-CM | POA: Diagnosis not present

## 2019-05-22 DIAGNOSIS — R0602 Shortness of breath: Secondary | ICD-10-CM | POA: Diagnosis not present

## 2019-05-22 DIAGNOSIS — E785 Hyperlipidemia, unspecified: Secondary | ICD-10-CM | POA: Diagnosis not present

## 2019-05-22 DIAGNOSIS — E1043 Type 1 diabetes mellitus with diabetic autonomic (poly)neuropathy: Secondary | ICD-10-CM | POA: Insufficient documentation

## 2019-05-22 DIAGNOSIS — E1022 Type 1 diabetes mellitus with diabetic chronic kidney disease: Secondary | ICD-10-CM | POA: Diagnosis not present

## 2019-05-22 DIAGNOSIS — E46 Unspecified protein-calorie malnutrition: Secondary | ICD-10-CM | POA: Insufficient documentation

## 2019-05-22 DIAGNOSIS — D696 Thrombocytopenia, unspecified: Secondary | ICD-10-CM | POA: Insufficient documentation

## 2019-05-22 DIAGNOSIS — K760 Fatty (change of) liver, not elsewhere classified: Secondary | ICD-10-CM | POA: Insufficient documentation

## 2019-05-22 DIAGNOSIS — Z7902 Long term (current) use of antithrombotics/antiplatelets: Secondary | ICD-10-CM | POA: Diagnosis not present

## 2019-05-22 DIAGNOSIS — I130 Hypertensive heart and chronic kidney disease with heart failure and stage 1 through stage 4 chronic kidney disease, or unspecified chronic kidney disease: Secondary | ICD-10-CM | POA: Diagnosis not present

## 2019-05-22 DIAGNOSIS — I052 Rheumatic mitral stenosis with insufficiency: Secondary | ICD-10-CM | POA: Diagnosis not present

## 2019-05-22 DIAGNOSIS — Z7982 Long term (current) use of aspirin: Secondary | ICD-10-CM | POA: Diagnosis not present

## 2019-05-22 DIAGNOSIS — Z953 Presence of xenogenic heart valve: Secondary | ICD-10-CM | POA: Insufficient documentation

## 2019-05-22 DIAGNOSIS — Z955 Presence of coronary angioplasty implant and graft: Secondary | ICD-10-CM | POA: Insufficient documentation

## 2019-05-22 DIAGNOSIS — G4733 Obstructive sleep apnea (adult) (pediatric): Secondary | ICD-10-CM | POA: Insufficient documentation

## 2019-05-22 DIAGNOSIS — I2510 Atherosclerotic heart disease of native coronary artery without angina pectoris: Secondary | ICD-10-CM | POA: Diagnosis not present

## 2019-05-22 DIAGNOSIS — I480 Paroxysmal atrial fibrillation: Secondary | ICD-10-CM | POA: Diagnosis not present

## 2019-05-22 DIAGNOSIS — I425 Other restrictive cardiomyopathy: Secondary | ICD-10-CM | POA: Insufficient documentation

## 2019-05-22 DIAGNOSIS — I5032 Chronic diastolic (congestive) heart failure: Secondary | ICD-10-CM | POA: Insufficient documentation

## 2019-05-22 DIAGNOSIS — Z9641 Presence of insulin pump (external) (internal): Secondary | ICD-10-CM | POA: Insufficient documentation

## 2019-05-22 DIAGNOSIS — I495 Sick sinus syndrome: Secondary | ICD-10-CM | POA: Insufficient documentation

## 2019-05-22 DIAGNOSIS — Z79899 Other long term (current) drug therapy: Secondary | ICD-10-CM | POA: Diagnosis not present

## 2019-05-22 DIAGNOSIS — Z950 Presence of cardiac pacemaker: Secondary | ICD-10-CM | POA: Insufficient documentation

## 2019-05-22 DIAGNOSIS — Z951 Presence of aortocoronary bypass graft: Secondary | ICD-10-CM | POA: Insufficient documentation

## 2019-05-22 LAB — BASIC METABOLIC PANEL
Anion gap: 11 (ref 5–15)
BUN: 48 mg/dL — ABNORMAL HIGH (ref 8–23)
CO2: 25 mmol/L (ref 22–32)
Calcium: 10.2 mg/dL (ref 8.9–10.3)
Chloride: 101 mmol/L (ref 98–111)
Creatinine, Ser: 1.93 mg/dL — ABNORMAL HIGH (ref 0.44–1.00)
GFR calc Af Amer: 31 mL/min — ABNORMAL LOW (ref >60)
GFR calc non Af Amer: 26 mL/min — ABNORMAL LOW (ref >60)
Glucose, Bld: 155 mg/dL — ABNORMAL HIGH (ref 70–99)
Potassium: 3.9 mmol/L (ref 3.5–5.1)
Sodium: 137 mmol/L (ref 135–145)

## 2019-05-22 LAB — SARS CORONAVIRUS 2 (TAT 6-24 HRS): SARS Coronavirus 2: NEGATIVE

## 2019-05-22 NOTE — Patient Instructions (Signed)
Labs today  We will only contact you if something comes back abnormal or we need to make some changes. Otherwise no news is good news!  Your physician has recommended that you have a cardiopulmonary stress test (CPX). CPX testing is a non-invasive measurement of heart and lung function. It replaces a traditional treadmill stress test. This type of test provides a tremendous amount of information that relates not only to your present condition but also for future outcomes. This test combines measurements of you ventilation, respiratory gas exchange in the lungs, electrocardiogram (EKG), blood pressure and physical response before, during, and following an exercise protocol.  Your physician recommends that you schedule a follow-up appointment in: 3 months with [name]  At the [facility], you and your health needs are our priority. As part of our continuing mission to provide you with exceptional heart care, we have created designated Provider Care Teams. These Care Teams include your primary Cardiologist (physician) and Advanced Practice Providers (APPs- Physician Assistants and Nurse Practitioners) who all work together to provide you with the care you need, when you need it.   You may see any of the following providers on your designated Care Team at your next follow up: Marland Kitchen Dr Glori Bickers . Dr [name] . Darrick Grinder, NP . Lyda Jester, PA   Please be sure to bring in all your medications bottles to every appointment.

## 2019-05-24 NOTE — Progress Notes (Signed)
Date:  05/24/2019   ID:  [name], [name] February 12, 1951, MRN [id number]   Provider location: [address] [place] Type of Visit: Established patient  PCP:  [name], MD  Cardiologist:  No primary care provider on file. Primary HF: Dr. Aundra Dubin   History of Present Illness: [name] [name] is a 67 y.o. female who has a history of CAD s/p CABG in 12/05 and redo in 1/11, bioprosthetic AVR, diastolic CHF, and sick sinus syndrome s/p PCM presents for cardiology followup.  She had initial CABG with SVG-RCA in 12/05 followed by redo SVG-RCA and bioprosthetic AVR in 1/11.  She has had diastolic CHF and is on Lasix.  In 9/14, she had a CPX test.  This showed severe functional limitation with ischemic ECG changes (inferolateral ST depression and ST elevation in V1 and V2). She had chest pain.  She had R/LHC in 9/14.  This showed elevated left and right heart filling pressures, patent SVG-PDA, and 80% distal LM/80% ostial LAD stenosis.  She was not a candidate for redo CABG (had 2 prior sternotomies as well as chest radiation for Hodgkins lymphoma).  She had DES from left main into proximal LAD and will need long-term DAPT => Brilinta was used as she has been a poor responder to Plavix in the past.  She re-developed angina in 11/15 and was admitted with unstable angina.  LHC showed 80% ostial LM and patent SVG-RCA. She had DES to ostial LM. Echo in 1/18 showed EF 60-65%, normal bioprosthetic aortic valve, mild-moderate MR, PASP 35 mmHg.   She has chronic iron deficiency anemia thought to be due to a slow GI bleed.  Last EGD was unremarkable and a capsule endoscopy was also unremarkable.  She has had periodic transfusions.    She had bilateral mastectomy for DCIS in 74/08 without complication.  She has an insulin pump followed by an endocrinologist at [facility]   Given myalgias with statins, she was started on Praluent.  LDL is now excellent.    In the past she has had short  runs of atrial fibrillation.  She is not anticoagulated. Planned for Watchman placement, but given her nickel allergy, we were unable to place a Watchman.     Diagnosed with OSA, now on CPAP.   Given ongoing problems with exertional dyspnea and occasional chest pain, Cardiolite was repeated in 9/18 and showed inferior changes.  She underwent left/right heart cath in 9/18.  This showed elevated filling pressures, R>L.  There was 75+% ostial LCx stenosis.  However, there were 2 layers of stent from the mid left main into the ostial LAD, and LCx intervention was thought to be very risky.  It was decided to manage her medically.  Echo was repeated in 10/18 showing EF 55-60%, moderate to severe TR.    Given recurrent chest pain, she had ETT-Cardiolite done in 3/19.  This was a normal study with no evidence for ischemia or infarction.  Echo in 1/20 showed EF 55-60%, mild LVH, stable bioprosthetic aortic valve, moderate-severe TR.    Recently, she has been more short of breath with exertion and renal function has been worse.  She was set up for echo and RHC.  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  RHC in 8/20 showed primarily  right-sided HF.  Cardiolite in 8/20 showed no ischemia or infarction.  Torsemide was increased and she cut back on sodium in her diet.   She returns today for followup of CHF and CAD.  Weight is down 2 lbs.  She has "good days and bad days."  She walks on her treadmill, after 15 minutes she stops due to shortness of breath and mild chest tightness.  This will resolve with rest.  No orthopnea/PND.  No lightheadedness.  She has been discussing HD recently with Dr. Lorrene Reid.  She is not a candidate for peritoneal dialysis due to incisional hernia. She asks  today about heart/kidney transplant.   Labs (2/19): hgb 13.2 Labs @ [facility] (3/19): K 4.7, creatinine 1.4, ANA negative, RF negative Labs (4/19): K 3.9, creatinine 1.52 => 1.63 Labs (6/19): LDL 8, HDL 42, TGs 259 Labs (9/19): K 4.9, creatinine 1.65, hgb 13.4, plts 129, LDL 46,TGs 182 Labs (12/19): K 4.9, creatinine 2.07, hgb 12.4 Labs (3/20): K 4, creatinine 1.8 Labs (6/20): K 4.7, creatinine 1.47 Labs (8/20): creatinine 2.13 => 2.46 => 2.4, LDL 22 Labs (9/20): K 4.8, creatinine 2.74, AST/ALT normal, hgb 12.6  PMH: 1. Sick sinus syndrome s/p [employer] PCM.  2. HTN 3. H/o SBO 4. Renal cell carcinoma s/p right nephrectomy in 2008.  5. 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  Fibroscan in 11/14 showed advanced fibrosis concerning for cirrhosis. EGD in 11/14 showed no varices.  6. Diastolic CHF: Echo (2/62) with EF 60-65%, bioprosthetic aortic valve with mean gradient 15 mmHg, mild MR.  CPX (9/14): peak VO2 10.4, VE/VCO2 43.8, inferolateral ST depression and ST elevation in V1/V2 with severe dyspnea and chest pain, severe functional limitation with ischemic changes.  Echo (9/14) with EF 60-65%, normal RV size and systolic function, mild-moderate MR, bioprosthetic aortic valve normal, PA systolic pressure 51 mmHg, no evidence for pericardial constriction though exam incomplete for constriction.  Echo (11/15) with EF 55-60%, bioprosthetic aortic valve, mild-moderate MR, moderate TR, PA systolic pressure 42 mmHg.  - TEE (5/17) with EF 60-65%, normal bioprosthetic aortic valve with mean gradient 13 mmHg, normal RV size and systolic function. - Echo (1/18) with EF 60-65%, normal bioprosthetic aortic valve, mild-moderate MR, PASP 35 mmHg.  - Echo (10/18) with EF 55-60%, bioprosthetic aortic valve mean gradient 10 mmHg, MAC with mild mitral stenosis (mean gradient 7) and mild mitral regurgitation, moderate to severe TR, PASP 43 mmHg.  - RHC (9/18): mean RA 13, PA 44/18 mean 33, mean PCWP 19, CI 3.45, PVR  2.7 WU.  - Echo (1/20): EF 55-60%, mild LVH, mild MR, stable bioprosthetic aortic valve, moderate-severe TR.  - RHC (8/20): mean RA 14, PA 43/15 mean 28, mean PCWP 13, PAPI 2, PVR 4.2, CI 2.31 - Echo (8/20): EF 55-60%, RV mildly dilated with normal systolic function, mildly D-shaped septum suggestive of RV pressure/volume overload, rheumatic-appearing mitral valve with mild mitral stenosis (mean gradient 6, MVA by PHT 4 cm^2), no evidence for pericardial constriction, moderate TR, bioprosthetic aortic valve functioning normally with mean gradient 6 mmHg.  7. TAH-BSO 8. Type I diabetes: Has insulin pump.  9. H/o ischemic colitis. 10. H/o PUD. 11. Diabetic gastroparesis. 12. Hyperlipidemia: Myalgias with Crestor and atorvastatin, elevated LFTs with simvastatin.  Myalgias with > 10 mg pravastatin. Now on Praluent.  13. CKD stage IV: Sees Dr. Lorrene Reid 14. Bicuspid aortic valve s/p 19 mm Edwards pericardial valve in 1/11 (had post-operative Dresslers syndrome).   15. CAD: CABG with SVG-RCA in 12/05.  Redo SVG-RCA  in 1/11 with AVR.  LHC/RHC (9/14) with mean RA 18, PA 62/25 mean 43, mean PCWP 26, CI 2.8, 70-80% distal LM stenosis, 80% ostial LAD stenosis, total occlusion RCA, SVG-PDA patent.  Patient had DES LM into LAD.  Plan to continue long-term ASA/Brilinta (poor responder to Plavix).  ETT-cardiolite (4/15) with 6' exercise, EF 60%, ST depression in recovery and mild chest pain, no ischemia or infarction by perfusion images.  Unstable angina 11/15 with 80% LM, patent SVG-RCA => DES to ostial LM.   - Lexiscan Cardiolite (2/18): EF 62%, no ischemia or infarction (normal).  She had a significant reaction to [place] and probably should not get again.  - Cardiolite (9/18): EF 72%, prior inferior infarct with mild peri-infarct ischemia.  - LHC (9/18): 2 layers stent left main - ostial LAD with 50% dLM in-stent restenosis, 75+% ostial LCx, totally occluded RCA, SVG-RCA patent.  Medical management planned.  -  ETT-Cardiolite (4/19): EF normal, 7'11" exercise, no ischemia or infarction.  - Lexiscan Cardiolite (8/20): No ischemia/infarction.  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  17. Anemia: Iron-deficiency.  Suspect chronic GI blood loss.  EGD in 9/14 was unremarkable.  Capsule endoscopy 10/14 was unremarkable.  Has required periodic blood transfusions.  18. Atrial fibrillation: Paroxysmal, noted by [facility] interrogation.  19. Carotid stenosis: Carotid dopplers (2/15) with 40-59% bilateral stenosis. Carotid dopplers (3/16) with 40-59% bilateral ICA stenosis. Carotid dopplers (3/17) with 40-59% bilateral ICA stenosis.  - Carotid dopplers (3/18) with mild BICA stenosis.  20. 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  22. C difficile colitis 2017 23. Gout 24. ?TIA: head CT negative.  25. OSA: Moderate, uses CPAP.  26. Chronic thrombocytopenia 27. Hypercalcemia 28. 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  29. Tricuspid regurgitation: Moderate by 8/20 echo.   Current Outpatient Medications  Medication Sig Dispense Refill   acyclovir ointment (ZOVIRAX) 5 % Apply 1 application topically every 3 (three) hours as needed (fever blister/cold sores).     ALPRAZolam (XANAX) 0.5 MG tablet Take 1 tablet (0.5 mg total) by mouth 2 (two) times daily as needed for anxiety. (Patient taking differently: Take 0.25-0.5 mg by mouth See admin instructions. Take 0.25 mg at bedtime, may take a additional 0.25-0.5 mg dose during the day as needed for anxiety) 60 tablet 1   amLODipine (NORVASC) 2.5 MG tablet TAKE ONE TABLET (2.5MG TOTAL) BY MOUTH DAILY 90 tablet 1   APIDRA 100 UNIT/ML injection Inject 40 Units into the skin as directed. INFUSE THROUGH INSULIN PUMP UTD, Bolus depends on carb intake  2   aspirin EC 81 MG tablet Take 81 mg by mouth at  bedtime.     BRILINTA 60 MG TABS tablet TAKE ONE TABLET (60MG TOTAL) BY MOUTH TWICE DAILY 180 tablet 0   clobetasol (OLUX) 0.05 % topical foam Apply 1 application topically 2 (two) times daily as needed (skin flaking).     Coenzyme Q10 (CO Q 10) 100 MG CAPS Take 100 mg by mouth at bedtime.      colchicine 0.6 MG tablet Take 0.6 mg by mouth daily as needed (gout flare).     CONTOUR NEXT TEST test strip USE TO TEST BLOOD SUGAR UP TO EIGHT TIMES A DAY     diclofenac sodium (VOLTAREN) 1 % GEL Apply 2 g topically 4 (four) times daily. 300 g 6   docusate sodium (COLACE)  100 MG capsule Take 100 mg by mouth daily as needed for mild constipation.      famotidine (PEPCID) 40 MG tablet Take 40 mg by mouth daily.     Fe Fum-FA-B Cmp-C-Zn-Mg-Mn-Cu (HEMATINIC PLUS VIT/MINERALS) 106-1 MG TABS TAKE 1 TABLET BY MOUTH THREE TIMES A WEEK(MONDAY, WEDNESDAY, AND FRIDAY) OR AS DIRECTED WITH A VITAMIN "C" (Patient taking differently: Take 1 tablet by mouth every Monday, Wednesday, and Friday. ) 90 tablet 0   fluticasone (FLONASE) 50 MCG/ACT nasal spray Place 2 sprays into both nostrils daily. 48 g 3   glucagon (GLUCAGON EMERGENCY) 1 MG injection Inject 1 mg into the vein once as needed (low blood sugar).      isosorbide mononitrate (IMDUR) 120 MG 24 hr tablet TAKE ONE TABLET (120MG TOTAL) BY MOUTH DAILY (Patient taking differently: Take 120 mg by mouth at bedtime. ) 90 tablet 3   latanoprost (XALATAN) 0.005 % ophthalmic solution Place 1 drop into both eyes at bedtime.     loratadine (CLARITIN) 10 MG tablet Take 10 mg by mouth daily as needed for allergies.     metolazone (ZAROXOLYN) 2.5 MG tablet Take 2.5 mg (1 tab) once as directed by CHF clinic. Call before taking [phone] 3 tablet 0   metoprolol tartrate (LOPRESSOR) 50 MG tablet TAKE ONE AND ONE-HALF TABLETS (75MG TOTAL) BY MOUTH TWICE DAILY 270 tablet 2   mupirocin cream (BACTROBAN) 2 % Apply 1 application topically 2 (two) times daily as needed.  90 g 0   nitroGLYCERIN (NITRODUR - DOSED IN MG/24 HR) 0.2 mg/hr patch Apply 1/4 patch to area daily prn 30 patch 12   nitroGLYCERIN (NITROLINGUAL) 0.4 MG/SPRAY spray PLACE 1 SPRAY UNDER THE TONGUE EVERY 5 MINUTES FOR 3 DOSES AS NEEDED FOR CHEST PAIN 14.7 g 0   Polyethyl Glycol-Propyl Glycol (SYSTANE) 0.4-0.3 % SOLN Apply 1 drop to eye daily as needed (dry eyes).     polyethylene glycol (MIRALAX / GLYCOLAX) 17 g packet Take 17 g by mouth daily. 1 capful everyday     PRALUENT 150 MG/ML SOAJ INJECT 150MG SUBCUTANEOUSLY EVERY 2 WEEKS (Patient taking differently: Inject 150 mg into the skin every 14 (fourteen) days. ) 6 mL 3   pravastatin (PRAVACHOL) 10 MG tablet TAKE ONE TABLET (10MG TOTAL) BY MOUTH DAILY 90 tablet 3   silver nitrate applicators 09-81 % applicator Use as needed to stop superficial skin bleeding. 10 each 0   spironolactone (ALDACTONE) 25 MG tablet TAKE ONE TABLET (25MG TOTAL) BY MOUTH DAILY 90 tablet 1   torsemide (DEMADEX) 20 MG tablet Take 2 tablets (40 mg total) by mouth daily. Take extra if needed for fluid 180 tablet 3   ULORIC 40 MG tablet Take 1 tablet (40 mg total) by mouth at bedtime. 90 tablet 3   UNABLE TO FIND Med Name: So Clean CPAP cleaning system 1 each 0   VASCEPA 1 g CAPS TAKE 2 CAPSULES (2 GRAMS TOTAL) BY MOUTHTWO TIMES DAILY (Patient taking differently: Take 2 g by mouth 2 (two) times daily. ) 360 capsule 3   Wheat Dextrin (BENEFIBER) POWD Take 1 Dose by mouth daily.     No current facility-administered medications for this encounter.     Allergies:   Avandia [rosiglitazone maleate], Cephalexin, Clindamycin hcl, Humalog [insulin lispro], Lincomycin, Metformin, Novolog [insulin aspart], Penicillins, Rosiglitazone, Tizanidine, Adhesive [tape], Atorvastatin, Cholestyramine, Gentamycin [gentamicin], Iodinated diagnostic agents, Limonene, Pravastatin, Prednisone, Ranexa [ranolazine], Rosuvastatin, Simvastatin, Strawberry extract, Clopidogrel, Codeine,  Erythromycin, Hydrocodone-acetaminophen, Latex, Neomycin, Nickel, Ranitidine, Tamiflu [oseltamivir], and  Tramadol   Social History:  The patient  reports that she has never smoked. She has never used smokeless tobacco. She reports that she does not drink alcohol or use drugs.   Family History:  The patient's family history includes Breast cancer (age of onset: 107) in her sister; Cancer (age of onset: 3) in her sister; Cancer (age of onset: 35) in her maternal grandfather; Diabetes in her father; Glaucoma in her brother; Heart attack in her father, maternal grandmother, and mother; Heart disease in her father and mother; Hypertension in her brother; Lung cancer in her sister.   ROS:  Please see the history of present illness.   All other systems are personally reviewed and negative.   Exam:   BP (!) 109/59    Pulse 69    Wt 51 kg (112 lb 6.4 oz)    SpO2 99%    BMI 19.91 kg/m  General: NAD Neck: No JVD, no thyromegaly or thyroid nodule.  Lungs: Clear to auscultation bilaterally with normal respiratory effort. CV: Nondisplaced PMI.  Heart regular S1/S2, no S3/S4, 2/6 early SEM RUSB, 1/6 early diastolic murmur.  No peripheral edema.  No carotid bruit.  Normal pedal pulses.  Abdomen: Soft, nontender, no hepatosplenomegaly, no distention.  Skin: Intact without lesions or rashes.  Neurologic: Alert and oriented x 3.  Psych: Normal affect. Extremities: No clubbing or cyanosis.  HEENT: Normal.   Recent Labs: 04/08/2019: B Natriuretic Peptide 111.8 04/13/2019: ALT 27; Hemoglobin 12.8; Platelets 126; TSH 2.141 05/22/2019: BUN 48; Creatinine, Ser 1.93; Potassium 3.9; Sodium 137  Personally reviewed   Wt Readings from Last 3 Encounters:  05/22/19 51 kg (112 lb 6.4 oz)  04/13/19 51.8 kg (114 lb 3.2 oz)  04/08/19 52 kg (114 lb 9.6 oz)      ASSESSMENT AND PLAN:  1. CAD: s/p SVG-RCA in 12/05, then redo SVG-RCA in 1/11-->distal left main/proximal LAD severe stenosis in 9/14. PCI with DES to  distal LM/proximal LAD was done rather than bypass as she would have been a poor candidate for redo sternotomy (2 prior sternotomies and history of chest wall radiation for Hodgkins disease). Recurrent unstable angina with 80% ostial LM treated with repeat DES in 11/15.  Lexiscan Cardiolite in 9/18 inferior changes.  Repeat LHC (9/18) showed 50% distal left main in-stent restenosis and 75+% ostial LCx stenosis. I reviewed the films at the time with interventional cardiology => would be very difficult to intervene on the ostial LCx through 2 overlapping stents from left main into the LAD.  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  Given no ACS, we planned medical management and increased Imdur.  Cardiolite in 3/19 and again in 8/20 showed no ischemia or infarction.  She has stable anginal symptoms, as above not thought to be a good PCI candidate.  - Continue Brilinta 60 mg bid (poor responder to plavix) long-term given recurrent left main disease.  - Continue ASA 81, statin/Praluent/Vascepa, metoprolol, and Imdur 120 mg daily.  - She was unable to tolerate ranolazine.  2. Bioprosthetic aortic valve replacement: In setting of bicuspid aortic valve, thoracic aorta not dilated on 2017 TEE. Valve looked ok on 8/20 echo.  3. Chronic diastolic CHF: Most recent echo in 8/20 showed normal LV systolic function, mildly dilated RV with normal systolic function, mild MS, moderate TR, normal bioprosthetic AoV, no evidence for pericardial constriction. RHC in 8/20 was concerning for right > left heart failure, she did appear to have equalization of  diastolic pressures concerning for restrictive cardiomyopathy, possibly related to prior radiation. Recently symptomatically worse, also with worsening renal function.  I increased her torsemide at a prior appointment, volume status looks better on exam and weight down but still NYHA class III symptoms. I am concerned that restrictive  cardiomyopathy and renal dysfunction are going to be very difficult to deal with.  - Continue torsemide 40 mg daily.  BMET today.  - Continue spironolactone 25 mg daily.  - I will arrange for CPX.  If she has severely depressed functional capacity due to HF, I discussed evaluation for heart/kidney transplant with her.  I do not think she would be a good heart transplant candidate given 2 prior sternotomies and porcelain aorta; however, she wants to evaluate all options.  If CPX looks bad as I suspect it will, I will have her see Dr. Mosetta Pigeon.  4. CKD: Patient has a single kidney s/p right nephrectomy and suspected diabetic nephropathy. CKD stage 4, slowly progressive.  Creatinine > 2 recently.  This is concerning, as above, for worsening cardiorenal syndrome in the setting of RV > LV failure/restrictive cardiomyopathy.   - BMET today.   5. Hyperlipidemia: She has not been able to tolerate any higher potency statin than pravastatin 10 mg daily. Good lipids in 8/20.  - Continue pravastatin and Praluent.  - She is on Vascepa for elevated TGs.   6. Anemia: Chronic/slow GI bleed. Unremarkable EGD and capsule endoscopy in fall 2014. Unfortunately, needs to stay on Brilinta long-term (Plavix non-responder). Rectal bleeding earlier this year thought to be diverticular, has now resolved.  Has followup with GI.  7. Atrial fibrillation: Paroxysmal, brief episodes noted on PCM interrogation. Given GI bleeding and need to be on Brilinta, risks have been thought to outweigh benefits of anticoagulation.  She is not a good Multaq candidate with CHF. If she needs an antiarrhythmic, consider Tikosyn or Sotalol but renal function may make these meds difficult to use.  No recent palpitations.  - We were unable to place Watchman device due to nickel allergy.  8. Carotid bruit: Only mild stenosis on last dopplers in 3/18.  9. SSS: Pacemaker in place.  10. HTN: controlled. Continue current regimen.  11. OSA:  Moderate, using CPAP.  12. ?TIA: Transient dysarthria in 3/18, could have been afib-related TIA. No recurrent symptoms.  13. Thrombocytopenia: Chronic and mild. Follows with hematology.  14. 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  Based on 8/20 RHC and echo it appears mild.  15. Tricuspid regurgitation: Moderate on 8/20 echo.   Signed, [name], MD  05/24/2019  [facility] [address] [facility] [place] [address] [phone] (office) [phone] (fax)

## 2019-05-25 ENCOUNTER — Telehealth: Payer: Self-pay | Admitting: *Deleted

## 2019-05-25 ENCOUNTER — Telehealth (INDEPENDENT_AMBULATORY_CARE_PROVIDER_SITE_OTHER): Payer: Medicare Other | Admitting: Cardiology

## 2019-05-25 ENCOUNTER — Telehealth: Payer: Self-pay | Admitting: Cardiology

## 2019-05-25 ENCOUNTER — Other Ambulatory Visit: Payer: Self-pay

## 2019-05-25 ENCOUNTER — Encounter: Payer: Self-pay | Admitting: Cardiology

## 2019-05-25 VITALS — BP 102/63 | HR 65 | Ht 63.0 in | Wt 110.0 lb

## 2019-05-25 DIAGNOSIS — I10 Essential (primary) hypertension: Secondary | ICD-10-CM

## 2019-05-25 DIAGNOSIS — G4733 Obstructive sleep apnea (adult) (pediatric): Secondary | ICD-10-CM

## 2019-05-25 DIAGNOSIS — N184 Chronic kidney disease, stage 4 (severe): Secondary | ICD-10-CM

## 2019-05-25 NOTE — Addendum Note (Signed)
Addended by: Harland German A on: 05/25/2019 03:44 PM   Modules accepted: Orders

## 2019-05-25 NOTE — Telephone Encounter (Signed)
See virtual visit from today.

## 2019-05-25 NOTE — Telephone Encounter (Signed)
Bipap titration sent to sleep pool

## 2019-05-25 NOTE — Telephone Encounter (Signed)
-----   Message from Sueanne Margarita, MD sent at 05/25/2019  2:55 PM EST ----- Patient not tolerating CPAP - please set up for in lab BiPAP titration

## 2019-05-25 NOTE — Progress Notes (Signed)
Virtual Visit via Video Note   This visit type was conducted due to national recommendations for restrictions regarding the COVID-19 Pandemic (e.g. social distancing) in an effort to limit this patient's exposure and mitigate transmission in our community.  Due to her co-morbid illnesses, this patient is at least at moderate risk for complications without adequate follow up.  This format is felt to be most appropriate for this patient at this time.  All issues noted in this document were discussed and addressed.  A limited physical exam was performed with this format.  Please refer to the patient's chart for her consent to telehealth for [facility].   Evaluation Performed:  Follow-up visit  This visit type was conducted due to national recommendations for restrictions regarding the COVID-19 Pandemic (e.g. social distancing).  This format is felt to be most appropriate for this patient at this time.  All issues noted in this document were discussed and addressed.  No physical exam was performed (except for noted visual exam findings with Video Visits).  Please refer to the patient's chart (MyChart message for video visits and phone note for telephone visits) for the patient's consent to telehealth for [facility].  Date:  05/25/2019   ID:  [name] [name], [name] [name] 15-Jul-1951, MRN [id number]  Patient Location:  Home  Provider location:   [place]  PCP:  [name], MD  Sleep medicine:  Fransico Him, MD Electrophysiologist:  [name], MD   Chief Complaint:  OSA  History of Present Illness:    [name] [name] is a 68 y.o. female who presents via audio/video conferencing for a telehealth visit today.    [name] [name] is a 68y.o. female with a hx of chronic diastolic CHF, CAD and afib who was referred for a sleep study by Dr. Aundra Dubin due to excessive daytime sleepiness. She was found to have moderate OSA with an AHI of 22/hr with significant oxygen desaturations as low as  85%. She underwent CPAP titration to 18cm H2O but did not tolerate the high pressure and was subsequently placed on auto Pap from 6 to 14 cm H2O.  She has been having some problems with her CPAP recently and wakes up at night gasping for air and feels like she cannot breath.  She tolerates the mask and does not think that it leaks.  She has not been sleeping as well as she would like because of waking up SOB.  She has severe mouth or and uses the Biotene mouthwash.  Her tongue is stuck to the roof of her mouth and teeth when she wakes up.    The patient does not have symptoms concerning for COVID-19 infection (fever, chills, cough, or new shortness of breath).   Prior CV studies:   The following studies were reviewed today:  PAP compliance download  Past Medical History:  Diagnosis Date  . Anxiety   . Aortic stenosis    a. bicuspid aortic valve; mean gradient of 20 mmHg in 2/10; b. s/p bioprosth AVR (19-mm Edwards pericardial valve) with a redo coronary artery bypass graft procedure in 07/2009; postoperative Dressler's syndrome    . Arthritis   . Breast cancer ([place])   . Carcinoma, renal cell ([place]) 05/2007   Laparoscopic right nephrectomy  . 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 Chronic diastolic CHF (congestive heart failure) ([place])    a. 9.2014 EchoP EF 60-65%, no rwma, bioprosth AVR, mean gradient of 40mHg, mild to mod MR, PASP 563mg.  . CKD (  chronic kidney disease), stage II   . Colitis, ischemic ([place]) 2008  . Coronary artery disease    a. initial CABG with SVG-RCA in 12/05. b. redo SVG-RCA and bioprosthetic AVR in 1/11. d. 9/14 - PCI with DES to distal LM/proximal LAD was done rather than bypass as she would have been a poor candidate for redo sternotomy. d. S/p DES to prox LM 05/2014.  . Diabetes mellitus 03/2010    TYPE II: Hemoglobin A1c of 7.4 in  03/2010; 8.4 in 06/2010; treated with insulin pump  . Duodenal ulcer    Remote; H. pylori positive  . Gastroparesis due to DM  ([place]) 05/01/2012  . Genetic testing 01/14/2017   Ms. Brickle underwent genetic counseling and testing for hereditary cancer syndromes on 01/01/2017. Her results were negative for mutations in all 83 genes analyzed by [employer]'s 83-gene Common Hereditary Cancers Panel. Genes analyzed include: ALK, APC, ATM, AXIN2, BAP1, BARD1, BLM, BMPR1A, BRCA1, BRCA2, BRIP1, CASR, CDC73, CDH1, CDK4, CDKN1B, CDKN1C, CDKN2A, CEBPA, CHEK2, CTNNA1, DICER1, DIS3L2,   . Hodgkin's disease ([place]) 1991   [place]le radiation therapy  . Hodgkin's disease ([place])   . Hyperlipidemia   . Hyperplastic gastric polyp 06/23/2012  . Hypertension   . Iron deficiency anemia    a. 03/2013 EGD: essentially normal. Possible slow GIB.  . Migraines   . NASH (nonalcoholic steatohepatitis) 1999   -biopsy in 1999  . OSA on CPAP 04/24/2017   Moderate with AHI 22/hr now on CPAP at 18cm H2o  . Osteopenia     hip on DEXA in October 2007.  . Osteoporosis   . Oxygen deficiency   . Pacemaker    six sinus syndrome  . PAF (paroxysmal atrial fibrillation) ([place])    a.  h/o brief episodes noted on ppm interrogation. b. given GI bleeding and need to be on both ASA 81 and Brilinta, risks likely outweigh benefits of anticoagulation.   . Renal insufficiency   . Small bowel obstruction (HCC)    Recurrent; resolved after resection of a lipoma  . Syncope    a. H/o recurrent syncope with pauses on loop recorder s/p Medtronic pacemaker 07/2012.   Past Surgical History:  Procedure Laterality Date  . ABDOMINAL HYSTERECTOMY    . AORTIC VALVE REPLACEMENT  2011   Aortic valve replacement surgery with a 19 mm bioprosthetic device post redo CABG January 2011.  Marland Kitchen BONE MARROW BIOPSY    . BREAST EXCISIONAL BIOPSY     benign, bilateral  . CARDIAC CATHETERIZATION    . CERVICAL BIOPSY    . COLONOSCOPY     Multiple with adenomatous polyps  . COLONOSCOPY  06/23/2012   Procedure: COLONOSCOPY;  Surgeon: Gatha Mayer, MD;  Location: [facility] ENDOSCOPY;  Service:  Endoscopy;  Laterality: N/A;  . CORONARY ARTERY BYPASS GRAFT  2005, 2011   CABG-most recent SVG to RCA-12/05; RCA occluded with patent graft in 2006; Redo bypass in 07/2009  . ESOPHAGOGASTRODUODENOSCOPY    . ESOPHAGOGASTRODUODENOSCOPY  06/23/2012   Procedure: ESOPHAGOGASTRODUODENOSCOPY (EGD);  Surgeon: Gatha Mayer, MD;  Location: [facility];  Service: Endoscopy;  Laterality: N/A;  . ESOPHAGOGASTRODUODENOSCOPY N/A 04/06/2013   Procedure: ESOPHAGOGASTRODUODENOSCOPY (EGD);  Surgeon: Irene Shipper, MD;  [facility];  Service: Endoscopy;  Laterality: N/A;  . EXPLORATORY LAPAROTOMY W/ BOWEL RESECTION     Small bowel resection of lipoma  . GIVENS CAPSULE STUDY N/A 04/30/2013   Procedure: GIVENS CAPSULE STUDY;  Surgeon: Gatha Mayer, MD;  Location: [place];  Service: Endoscopy;  Laterality: N/A;  . LEFT AND RIGHT HEART CATHETERIZATION WITH CORONARY ANGIOGRAM N/A 04/07/2013   Procedure: LEFT AND RIGHT HEART CATHETERIZATION WITH CORONARY ANGIOGRAM;  Surgeon: Larey Dresser, MD;  Location: [facility] CATH LAB;  Service: Cardiovascular;  Laterality: N/A;  . LEFT ATRIAL APPENDAGE OCCLUSION N/A 02/16/2016   Watchman not placed - nickel allergy  . LEFT HEART CATHETERIZATION WITH CORONARY/GRAFT ANGIOGRAM N/A 06/07/2014   Procedure: LEFT HEART CATHETERIZATION WITH Beatrix Fetters;  Surgeon: Burnell Blanks, MD;  Location: [facility] C[facility] LAB;  Service: Cardiovascular;  Laterality: N/A;  . LIVER BIOPSY    . LOOP RECORDER IMPLANT N/A 12/10/2011   Procedure: LOOP RECORDER IMPLANT;  Surgeon: Evans Lance, MD;  Location: [facility] CATH LAB;  Service: Cardiovascular;  Laterality: N/A;  . MALONEY DILATION  06/23/2012   Procedure: [name] DILATION;  Surgeon: Gatha Mayer, MD;  Location: [facility] ENDOSCOPY;  Service: Endoscopy;;  54 fr  . MASTECTOMY Left 05/17/2015   total   . NEPHRECTOMY  2008   Laparoscopic right nephrectomy, renal cell carcinoma  . PACEMAKER INSERTION  08/21/2012   Medtronic Adapta L  dual-chamber pacemaker  . PERCUTANEOUS CORONARY STENT INTERVENTION (PCI-S) N/A 04/09/2013   Procedure: PERCUTANEOUS CORONARY STENT INTERVENTION (PCI-S);  Surgeon: Burnell Blanks, MD;  Location: [facility] CATH LAB;  Service: Cardiovascular;  Laterality: N/A;  . PERMANENT PACEMAKER INSERTION N/A 08/21/2012   Procedure: PERMANENT PACEMAKER INSERTION;  Surgeon: Evans Lance, MD;  Location: [facility] CATH LAB;  Service: Cardiovascular;  Laterality: N/A;  . RIGHT HEART CATH N/A 03/18/2019   Procedure: RIGHT HEART CATH;  Surgeon: Larey Dresser, MD;  Location: [place] CV LAB;  Service: Cardiovascular;  Laterality: N/A;  . RIGHT HEART CATH AND CORONARY/GRAFT ANGIOGRAPHY N/A 04/19/2017   Procedure: RIGHT HEART CATH AND CORONARY/GRAFT ANGIOGRAPHY;  Surgeon: Larey Dresser, MD;  Location: [place] CV LAB;  Service: Cardiovascular;  Laterality: N/A;  . TEE WITHOUT CARDIOVERSION N/A 12/07/2015   Procedure: TRANSESOPHAGEAL ECHOCARDIOGRAM (TEE);  Surgeon: Larey Dresser, MD;  Location: [place];  Service: Cardiovascular;  Laterality: N/A;  . TOTAL ABDOMINAL HYSTERECTOMY W/ BILATERAL SALPINGOOPHORECTOMY  2000   endometriosis and ovarian cysts  . TOTAL MASTECTOMY Left 05/17/2015   Procedure: LEFT TOTAL MASTECTOMY;  Surgeon: Rolm Bookbinder, MD;  Location: [place];  Service: General;  Laterality: Left;  . TUMOR EXCISION  1981   removal of Hodgkins lymphoma     Current Meds  Medication Sig  . acyclovir ointment (ZOVIRAX) 5 % Apply 1 application topically every 3 (three) hours as needed (fever blister/cold sores).  . ALPRAZolam (XANAX) 0.5 MG tablet Take 1 tablet (0.5 mg total) by mouth 2 (two) times daily as needed for anxiety. (Patient taking differently: Take 0.25-0.5 mg by mouth See admin instructions. Take 0.25 mg at bedtime, may take a additional 0.25-0.5 mg dose during the day as needed for anxiety)  . amLODipine (NORVASC) 2.5 MG tablet TAKE ONE TABLET (2.5MG TOTAL) BY MOUTH DAILY  . APIDRA 100  UNIT/ML injection Inject 40 Units into the skin as directed. INFUSE THROUGH INSULIN PUMP UTD, Bolus depends on carb intake  . aspirin EC 81 MG tablet Take 81 mg by mouth at bedtime.  Marland Kitchen BRILINTA 60 MG TABS tablet TAKE ONE TABLET (60MG TOTAL) BY MOUTH TWICE DAILY  . clobetasol (OLUX) 0.05 % topical foam Apply 1 application topically 2 (two) times daily as needed (skin flaking).  . Coenzyme Q10 (CO Q 10) 100 MG CAPS Take 100 mg by mouth at bedtime.   Marland Kitchen  colchicine 0.6 MG tablet Take 0.6 mg by mouth daily as needed (gout flare).  . CONTOUR NEXT TEST test strip USE TO TEST BLOOD SUGAR UP TO EIGHT TIMES A DAY  . diclofenac sodium (VOLTAREN) 1 % GEL Apply 2 g topically 4 (four) times daily. (Patient taking differently: Apply 2 g topically 4 (four) times daily as needed (Pain). )  . docusate sodium (COLACE) 100 MG capsule Take 100 mg by mouth daily as needed for mild constipation.   . famotidine (PEPCID) 40 MG tablet Take 40 mg by mouth daily.  . Fe Fum-FA-B Cmp-C-Zn-Mg-Mn-Cu (HEMATINIC PLUS VIT/MINERALS) 106-1 MG TABS TAKE 1 TABLET BY MOUTH THREE TIMES A WEEK(MONDAY, WEDNESDAY, AND FRIDAY) OR AS DIRECTED WITH A VITAMIN "C" (Patient taking differently: Take 1 tablet by mouth every Monday, Wednesday, and Friday. )  . fluticasone (FLONASE) 50 MCG/ACT nasal spray Place 2 sprays into both nostrils daily. (Patient taking differently: Place 2 sprays into both nostrils daily as needed for allergies. )  . glucagon (GLUCAGON EMERGENCY) 1 MG injection Inject 1 mg into the vein once as needed (low blood sugar).   . isosorbide mononitrate (IMDUR) 120 MG 24 hr tablet TAKE ONE TABLET (120MG TOTAL) BY MOUTH DAILY (Patient taking differently: Take 120 mg by mouth at bedtime. )  . latanoprost (XALATAN) 0.005 % ophthalmic solution Place 1 drop into both eyes at bedtime.  Marland Kitchen loratadine (CLARITIN) 10 MG tablet Take 10 mg by mouth daily as needed for allergies.  . metolazone (ZAROXOLYN) 2.5 MG tablet Take 2.5 mg (1 tab) once as  directed by CHF clinic. Call before taking [phone]  . metoprolol tartrate (LOPRESSOR) 50 MG tablet TAKE ONE AND ONE-HALF TABLETS (75MG TOTAL) BY MOUTH TWICE DAILY  . mupirocin cream (BACTROBAN) 2 % Apply 1 application topically 2 (two) times daily as needed.  . nitroGLYCERIN (NITRODUR - DOSED IN MG/24 HR) 0.2 mg/hr patch Apply 1/4 patch to area daily prn  . nitroGLYCERIN (NITROLINGUAL) 0.4 MG/SPRAY spray PLACE 1 SPRAY UNDER THE TONGUE EVERY 5 MINUTES FOR 3 DOSES AS NEEDED FOR CHEST PAIN  . Polyethyl Glycol-Propyl Glycol (SYSTANE) 0.4-0.3 % SOLN Apply 1 drop to eye daily as needed (dry eyes).  . polyethylene glycol (MIRALAX / GLYCOLAX) 17 g packet Take 17 g by mouth daily. 1 capful everyday  . PRALUENT 150 MG/ML SOAJ INJECT 150MG SUBCUTANEOUSLY EVERY 2 WEEKS (Patient taking differently: Inject 150 mg into the skin every 14 (fourteen) days. )  . pravastatin (PRAVACHOL) 10 MG tablet TAKE ONE TABLET (10MG TOTAL) BY MOUTH DAILY  . silver nitrate applicators 48-18 % applicator Use as needed to stop superficial skin bleeding.  Marland Kitchen spironolactone (ALDACTONE) 25 MG tablet TAKE ONE TABLET (25MG TOTAL) BY MOUTH DAILY (Patient taking differently: Take 12.5 mg by mouth daily. )  . torsemide (DEMADEX) 20 MG tablet Take 2 tablets (40 mg total) by mouth daily. Take extra if needed for fluid  . ULORIC 40 MG tablet Take 1 tablet (40 mg total) by mouth at bedtime.  Marland Kitchen UNABLE TO FIND Med Name: So Clean CPAP cleaning system  . VASCEPA 1 g CAPS TAKE 2 CAPSULES (2 GRAMS TOTAL) BY MOUTHTWO TIMES DAILY (Patient taking differently: Take 2 g by mouth 2 (two) times daily. )  . Wheat Dextrin (BENEFIBER) POWD Take 1 Dose by mouth daily.     Allergies:   Avandia [rosiglitazone maleate], Cephalexin, Clindamycin hcl, Humalog [insulin lispro], Lincomycin, Metformin, Novolog [insulin aspart], Penicillins, Rosiglitazone, Tizanidine, Adhesive [tape], Atorvastatin, Cholestyramine, Gentamycin [gentamicin], Iodinated diagnostic  agents,  Limonene, Pravastatin, Prednisone, Ranexa [ranolazine], Rosuvastatin, Simvastatin, Strawberry extract, Clopidogrel, Codeine, Erythromycin, Hydrocodone-acetaminophen, Latex, Neomycin, Nickel, Ranitidine, Tamiflu [oseltamivir], and Tramadol   Social History   Tobacco Use  . Smoking status: Never Smoker  . Smokeless tobacco: Never Used  Substance Use Topics  . Alcohol use: No    Alcohol/week: 0.0 standard drinks  . Drug use: No     Family Hx: The patient's family history includes Breast cancer (age of onset: 63) in her sister; Cancer (age of onset: 32) in her sister; Cancer (age of onset: 41) in her maternal grandfather; Diabetes in her father; Glaucoma in her brother; Heart attack in her father, maternal grandmother, and mother; Heart disease in her father and mother; Hypertension in her brother; Lung cancer in her sister. There is no history of Colon cancer or Stomach cancer.  ROS:   Please see the history of present illness.     All other systems reviewed and are negative.   Labs/Other Tests and Data Reviewed:    Recent Labs: 04/08/2019: B Natriuretic Peptide 111.8 04/13/2019: ALT 27; Hemoglobin 12.8; Platelets 126; TSH 2.141 05/22/2019: BUN 48; Creatinine, Ser 1.93; Potassium 3.9; Sodium 137   Recent Lipid Panel Lab Results  Component Value Date/Time   CHOL 89 03/03/2019 11:09 AM   TRIG 147 03/03/2019 11:09 AM   HDL 38 (L) 03/03/2019 11:09 AM   CHOLHDL 2.3 03/03/2019 11:09 AM   LDLCALC 22 03/03/2019 11:09 AM   LDLDIRECT 43.0 12/14/2014 09:38 AM    Wt Readings from Last 3 Encounters:  05/25/19 110 lb (49.9 kg)  05/22/19 112 lb 6.4 oz (51 kg)  04/13/19 114 lb 3.2 oz (51.8 kg)     Objective:    Vital Signs:  BP 102/63   Pulse 65   Ht 5' 3"  (1.6 m)   Wt 110 lb (49.9 kg)   BMI 19.49 kg/m    CONSTITUTIONAL:  Well nourished, well developed female in no acute distress.  EYES: anicteric MOUTH: oral mucosa is pink RESPIRATORY: Normal respiratory effort,  symmetric expansion CARDIOVASCULAR: No peripheral edema SKIN: No rash, lesions or ulcers MUSCULOSKELETAL: no digital cyanosis NEURO: Cranial Nerves II-XII grossly intact, moves all extremities PSYCH: Intact judgement and insight.  A&O x 3, Mood/affect appropriate   ASSESSMENT & PLAN:    1.  OSA -  She is having a lot of problems with her PAP device. She is having a lot or mouth dryness.  She also is waking up at night gasping for breath and unable to breath.  She is very claustraphobic with the full face mask and uses a nasal pillow mask and chin strap. The PAP download was reviewed today and showed an AHI of 16.8/hr on auto PAP  with 93% compliance in using more than 4 hours nightly.  I have recommended that we send her to the sleep lab for a BiPAP titration.  2.  HTN -BP controlled -continue amlodipine 2.19m daily, Lopressor 752mBID and Spiro 2525maily  3.  CKD -stage 4 -followed by Dr. DunLorrene Reidhe says that her MDs are talking about a combined heart/kidney transplant  COVID-19 Education: The signs and symptoms of COVID-19 were discussed with the patient and how to seek care for testing (follow up with PCP or arrange E-visit).  The importance of social distancing was discussed today.  Patient Risk:   After full review of this patient's clinical status, I feel that they are at least moderate risk at this time.  Time:   Today, I have  spent 20 minutes directly with the patient on telemedicine discussing medical problems including OSA< HTN.  We also reviewed the symptoms of COVID 19 and the ways to protect against contracting the virus with telehealth technology.  I spent an additional 5 minutes reviewing patient's chart including PAP compliance download.  Medication Adjustments/Labs and Tests Ordered: Current medicines are reviewed at length with the patient today.  Concerns regarding medicines are outlined above.  Tests Ordered: No orders of the defined types were placed in this  encounter.  Medication Changes: No orders of the defined types were placed in this encounter.   Disposition:  Follow up after BiPAP titration  Signed, Fransico Him, MD  05/25/2019 2:50 PM    [facility]

## 2019-05-25 NOTE — Telephone Encounter (Signed)
Patient states she was to receive a call at 1:50 for her 2:20 appt today but has not received a call. Just calling to confirm she is still set to receive a call from Dr. Radford Pax today.

## 2019-05-26 ENCOUNTER — Ambulatory Visit (HOSPITAL_COMMUNITY): Payer: Medicare Other | Attending: Cardiology

## 2019-05-26 ENCOUNTER — Other Ambulatory Visit: Payer: Self-pay

## 2019-05-26 DIAGNOSIS — I5032 Chronic diastolic (congestive) heart failure: Secondary | ICD-10-CM | POA: Diagnosis present

## 2019-05-27 ENCOUNTER — Telehealth (HOSPITAL_COMMUNITY): Payer: Self-pay

## 2019-05-27 NOTE — Telephone Encounter (Signed)
LM for patient to return call to discuss referral and plan. Spoke with Jazmine in Dr Ephra[facility] Hamburger office [phone], she asked that patient documents be faxed over.  Documents including MD notes, cpx test, lab work, echo and cath report faxed to [phone]

## 2019-05-27 NOTE — Telephone Encounter (Signed)
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  However, this is significant enough that I would like to refer her to [facility] for evaluation for heart/kidney transplant.  Have her see Dr. Melvyn Novas.  I spoke to her about this before, so let her know the plan and make the referral.

## 2019-05-28 ENCOUNTER — Encounter (HOSPITAL_COMMUNITY): Payer: Self-pay

## 2019-05-28 NOTE — Telephone Encounter (Signed)
Pt aware of results and plan to refer to [facility]. Aware that documents sent and to expect a phone call from them. Pt appreciative.

## 2019-06-01 ENCOUNTER — Telehealth: Payer: Self-pay | Admitting: *Deleted

## 2019-06-01 ENCOUNTER — Ambulatory Visit (INDEPENDENT_AMBULATORY_CARE_PROVIDER_SITE_OTHER): Payer: Medicare Other | Admitting: *Deleted

## 2019-06-01 DIAGNOSIS — I480 Paroxysmal atrial fibrillation: Secondary | ICD-10-CM | POA: Diagnosis not present

## 2019-06-01 NOTE — Telephone Encounter (Signed)
Staff message sent to Gae Bon per [facility] web portal no PA required for BIPAP titration. Ok to schedule. Decision ID: [id number].

## 2019-06-02 ENCOUNTER — Telehealth (HOSPITAL_COMMUNITY): Payer: Self-pay

## 2019-06-02 LAB — CUP PACEART REMOTE DEVICE CHECK
Battery Impedance: 381 Ohm
Battery Remaining Longevity: 122 mo
Battery Voltage: 2.81 V
Brady Statistic AP VP Percent: 0 %
Brady Statistic AP VS Percent: 5 %
Brady Statistic AS VP Percent: 0 %
Brady Statistic AS VS Percent: 94 %
Date Time Interrogation Session: 20201109134706
Implantable Lead Implant Date: 20140130
Implantable Lead Implant Date: 20140130
Implantable Lead Location: 753859
Implantable Lead Location: 753860
Implantable Lead Model: 5076
Implantable Lead Model: 5076
Implantable Pulse Generator Implant Date: 20140130
Lead Channel Impedance Value: 411 Ohm
Lead Channel Impedance Value: 562 Ohm
Lead Channel Pacing Threshold Amplitude: 0.375 V
Lead Channel Pacing Threshold Amplitude: 0.625 V
Lead Channel Pacing Threshold Pulse Width: 0.4 ms
Lead Channel Pacing Threshold Pulse Width: 0.4 ms
Lead Channel Setting Pacing Amplitude: 1.5 V
Lead Channel Setting Pacing Amplitude: 2.5 V
Lead Channel Setting Pacing Pulse Width: 0.4 ms
Lead Channel Setting Sensing Sensitivity: 5.6 mV

## 2019-06-02 NOTE — Telephone Encounter (Signed)
Pt called to inquire about referral to Dr Mosetta Pigeon at [facility].  Called and spoke with Delana Meyer and Jantel at Dr [facility] office.  They confirmed receiving patients documents and they will be reviewed by appropriate team and patient will receive a call.  Pt made aware of same.  Verbalized understanding.

## 2019-06-05 ENCOUNTER — Telehealth: Payer: Self-pay | Admitting: *Deleted

## 2019-06-05 NOTE — Telephone Encounter (Signed)
-----   Message from Lauralee Evener, Box sent at 06/01/2019  8:17 AM EST ----- Regarding: RE: precert Per [employer] web portal no PA is required. Ok to schedule. Decision ID: [id number]. ----- Message ----- From: Freada Bergeron, CMA Sent: 05/25/2019   6:07 PM EST To: [facility] Sleep Studies Subject: precert                                         ----- Message ----- From: Theodoro Parma, RN Sent: 05/25/2019   3:45 PM EST To: Freada Bergeron, CMA Subject: bipap titration                                Bipap titration ordered for scheduling  Thanks!

## 2019-06-05 NOTE — Telephone Encounter (Signed)
Patient is scheduled for BiPAP Titration on 06/16/19. [name] [name] is scheduled for COVID screening on 06/13/19 prior to titration.   Patient understands her titration study will be done at [facility] sleep lab. Patient understands she will receive a letter in a week or so detailing appointment, date, time, and location. Patient understands to call if she does not receive the letter  in a timely manner. Left detailed message on voicemail with date and time of titration and informed patient to call back to confirm or reschedule.

## 2019-06-09 ENCOUNTER — Other Ambulatory Visit: Payer: Self-pay

## 2019-06-09 ENCOUNTER — Ambulatory Visit: Payer: Medicare Other | Admitting: Orthopaedic Surgery

## 2019-06-09 ENCOUNTER — Ambulatory Visit: Payer: Self-pay

## 2019-06-09 DIAGNOSIS — M79661 Pain in right lower leg: Secondary | ICD-10-CM

## 2019-06-09 DIAGNOSIS — M79604 Pain in right leg: Secondary | ICD-10-CM

## 2019-06-09 NOTE — Progress Notes (Signed)
Office Visit Note   Patient: [name] [name]           Date of Birth: 1951-01-08           MRN: [id number] Visit Date: 06/09/2019              Requested by: [name] Macadam, MD [name],  [place] [address] PCP: [name] Macadam, MD   Assessment & Plan: Visit Diagnoses:  1. Pain in right lower leg     Plan: Impression is questionable occult right distal tibia fracture.  Will obtain a CT scan for further evaluation.  In the meantime, the patient will rest, ice and elevate her leg is much as possible.  She will follow-up with [name] following the CT scan.  Follow-Up Instructions: Return for after CT scan.   Orders:  Orders Placed This Encounter  Procedures  . XR Tibia/Fibula Right   No orders of the defined types were placed in this encounter.     Procedures: No procedures performed   Clinical Data: No additional findings.   Subjective: Chief Complaint  Patient presents with  . Right Leg - Pain    HPI patient is a pleasant 68 year old female who presents to our clinic today with pain to the anterior aspect of her right lower leg.  This began on 05/25/2019 after dropping a frozen court of stew out of the freezer and landing on her shin.  She had pain initially but was able to bear weight.  Over the past week her pain has worsened and she has gotten to the point where it hurts to bear any weight to the right lower extremity.  She has had a hematoma which is increased in size over the past week as well.  She notes that this ruptured about a week ago but has since scabbed over.  No fevers or chills.  Of note, she has a history of multiple cancers and is anticoagulated.  Review of Systems as detailed in HPI.  All others reviewed and are negative.   Objective: Vital Signs: There were no vitals taken for this visit.  Physical Exam well-developed and well-nourished female in no acute distress.  Alert and oriented x3.  Ortho Exam examination of her right tibia  reveals a grape size hematoma with a superficial abrasion that has crusted over.  No signs of infection or cellulitis.  She is moderately tender to the hematoma as well as to the distal tibia itself.  She has mild to moderate swelling to the distal tibia and into the foot.  Increased pain with range of motion of the ankle.  She is neurovascularly intact distally.  Specialty Comments:  No specialty comments available.  Imaging: Xr Tibia/fibula Right  Result Date: 06/09/2019 No acute or structural abnormalities    PMFS History: Patient Active Problem List   Diagnosis Date Noted  . Right hip pain 04/14/2019  . OSA on CPAP 04/24/2017  . Genetic testing 01/14/2017  . Insulin pump status 09/12/2016  . S/P left mastectomy 07/29/2016  . History of breast cancer 03/29/2016  . Dense breast 12/11/2015  . Chronic anticoagulation 12/11/2015  . Hx of radiation therapy 04/23/2015  . Thrombocytopenia ([place]) 04/23/2015  . 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 Radiation therapy complication 43/15/4008  . History of nodular sclerosis Hodgkin's disease 10/27/2014  . Insulin dependent type 1 diabetes mellitus ([place]) 10/07/2014  . Claudication ([place]) 07/29/2014  .  PAF (paroxysmal atrial fibrillation) ([place])   . Coronary artery disease involving native coronary artery of native heart with unstable angina pectoris ([place])   . Unstable angina ([place]) 04/11/2013  . Chronic diastolic CHF (congestive heart failure) ([place]) 04/03/2013  . Artificial cardiac pacemaker 03/12/2013  . Chronic kidney disease (CKD), stage III (moderate) 03/12/2013  . Big thyroid 03/12/2013  . Encounter for fitting or adjustment of insulin pump 03/12/2013  . Congestive heart failure ([place]) 03/12/2013  . Lipoma of neck 09/23/2012  . Pacemaker 09/08/2012  . Atrial flutter ([place]) 07/23/2012  . Sick sinus syndrome ([place]) 07/21/2012  . Gastroparesis due  to DM ([place]) 05/01/2012  . Hypercalcemia   . Cholelithiasis 07/20/2011  . Aortic stenosis status post 19 mm Edwards pericardial valve replacement 2011   . Arteriosclerotic cardiovascular disease (ASCVD)   . Osteopenia   . NASH (nonalcoholic steatohepatitis)   . Renal cell carcinoma (HCC)     Class: History of  . Duodenal ulcer     Class: History of  . Ischemic colitis     Class: History of  . Gastroesophageal reflux disease   . Hypertension   . Small bowel obstruction ([place])   . Hyperlipidemia 04/11/2010  . Colonic polyps, adenomatous 09/29/2009   Past Medical History:  Diagnosis Date  . Anxiety   . Aortic stenosis    a. bicuspid aortic valve; mean gradient of 20 mmHg in 2/10; b. s/p bioprosth AVR (19-mm Edwards pericardial valve) with a redo coronary artery bypass graft procedure in 07/2009; postoperative Dressler's syndrome    . Arthritis   . Breast cancer ([place])   . Carcinoma, renal cell ([place]) 05/2007   Laparoscopic right nephrectomy  . 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 Chronic diastolic CHF (congestive heart failure) ([place])    a. 9.2014 EchoP EF 60-65%, no rwma, bioprosth AVR, mean gradient of 5mHg, mild to mod MR, PASP 570mg.  . CKD (chronic kidney disease), stage II   . Colitis, ischemic (HCAccident2008  . Coronary artery disease    a. initial CABG with SVG-RCA in 12/05. b. redo SVG-RCA and bioprosthetic AVR in 1/11. d. 9/14 - PCI with DES to distal LM/proximal LAD was done rather than bypass as she would have been a poor candidate for redo sternotomy. d. S/p DES to prox LM 05/2014.  . Diabetes mellitus 03/2010    TYPE II: Hemoglobin A1c of 7.4 in  03/2010; 8.4 in 06/2010; treated with insulin pump  . Duodenal ulcer    Remote; H. pylori positive  . Gastroparesis due to DM (HCCibecue10/04/2012  . Genetic testing 01/14/2017   [name] underwent genetic counseling and testing for hereditary cancer syndromes on 01/01/2017. Her results were negative for mutations in all 83  genes analyzed by [employer]'s 83-gene Common Hereditary Cancers Panel. Genes analyzed include: ALK, APC, ATM, AXIN2, BAP1, BARD1, BLM, BMPR1A, BRCA1, BRCA2, BRIP1, CASR, CDC73, CDH1, CDK4, CDKN1B, CDKN1C, CDKN2A, CEBPA, CHEK2, CTNNA1, DICER1, DIS3L2,   . Hodgkin's disease (HCHelvetia1991   Mantle radiation therapy  . Hodgkin's disease (HCNewfield Hamlet  . Hyperlipidemia   . Hyperplastic gastric polyp 06/23/2012  . Hypertension   . Iron deficiency anemia    a. 03/2013 EGD: essentially normal. Possible slow GIB.  . Migraines   . NASH (nonalcoholic steatohepatitis) 1999   -biopsy in 1999  . OSA on CPAP 04/24/2017   Moderate with AHI 22/hr now on CPAP at 18cm H2o  . Osteopenia     hip on DEXA in October 2007.  .Marland Kitchen  Osteoporosis   . Oxygen deficiency   . Pacemaker    six sinus syndrome  . PAF (paroxysmal atrial fibrillation) ([place])    a.  h/o brief episodes noted on ppm interrogation. b. given GI bleeding and need to be on both ASA 81 and Brilinta, risks likely outweigh benefits of anticoagulation.   . Renal insufficiency   . Small bowel obstruction (HCC)    Recurrent; resolved after resection of a lipoma  . Syncope    a. H/o recurrent syncope with pauses on loop recorder s/p Medtronic pacemaker 07/2012.    Family History  Problem Relation Age of Onset  . Heart attack Mother        d.61  . Heart disease Mother   . Heart attack Father        d.60  . Diabetes Father   . Heart disease Father   . Lung cancer Sister        d.61 metastases to brain. History of smoking. Maternal half-sister.  . Cancer Sister 30       unspecified gynecologic cancer (either cervical or ovarian) in her 61s.  Marland Kitchen Heart attack Maternal Grandmother   . Cancer Maternal Grandfather 22       d.52s oral cancer  . Hypertension Brother   . Glaucoma Brother   . Breast cancer Sister 38       d.42 from metastatic disease. Paternal half-sister.  . Colon cancer Neg Hx   . Stomach cancer Neg Hx     Past Surgical History:  Procedure  Laterality Date  . ABDOMINAL HYSTERECTOMY    . AORTIC VALVE REPLACEMENT  2011   Aortic valve replacement surgery with a 19 mm bioprosthetic device post redo CABG January 2011.  Marland Kitchen BONE MARROW BIOPSY    . BREAST EXCISIONAL BIOPSY     benign, bilateral  . CARDIAC CATHETERIZATION    . CERVICAL BIOPSY    . COLONOSCOPY     Multiple with adenomatous polyps  . COLONOSCOPY  06/23/2012   Procedure: COLONOSCOPY;  Surgeon: Gatha Mayer, MD;  [facility]: [facility];  Service: Endoscopy;  Laterality: N/A;  . CORONARY ARTERY BYPASS GRAFT  2005, 2011   CABG-most recent SVG to RCA-12/05; RCA occluded with patent graft in 2006; Redo bypass in 07/2009  . ESOPHAGOGASTRODUODENOSCOPY    . ESOPHAGOGASTRODUODENOSCOPY  06/23/2012   Procedure: ESOPHAGOGASTRODUODENOSCOPY (EGD);  Surgeon: Gatha Mayer, MD;  [facility];  Service: Endoscopy;  Laterality: N/A;  . ESOPHAGOGASTRODUODENOSCOPY N/A 04/06/2013   Procedure: ESOPHAGOGASTRODUODENOSCOPY (EGD);  Surgeon: Irene Shipper, MD;  Location: [facility] ENDOSCOPY;  Service: Endoscopy;  Laterality: N/A;  . EXPLORATORY LAPAROTOMY W/ BOWEL RESECTION     Small bowel resection of lipoma  . GIVENS CAPSULE STUDY N/A 04/30/2013   Procedure: GIVENS CAPSULE STUDY;  Surgeon: Gatha Mayer, MD;  Location: [place];  Service: Endoscopy;  Laterality: N/A;  . LEFT AND RIGHT HEART CATHETERIZATION WITH CORONARY ANGIOGRAM N/A 04/07/2013   Procedure: LEFT AND RIGHT HEART CATHETERIZATION WITH CORONARY ANGIOGRAM;  Surgeon: Larey Dresser, MD;  Location: [facility] CATH LAB;  Service: Cardiovascular;  Laterality: N/A;  . LEFT ATRIAL APPENDAGE OCCLUSION N/A 02/16/2016   Watchman not placed - nickel allergy  . LEFT HEART CATHETERIZATION WITH CORONARY/GRAFT ANGIOGRAM N/A 06/07/2014   Procedure: LEFT HEART CATHETERIZATION WITH Beatrix Fetters;  Surgeon: Burnell Blanks, MD;  Location[facility] CATH LAB;  Service: Cardiovascular;  Laterality: N/A;  . LIVER BIOPSY    . LOOP RECORDER  IMPLANT N/A 12/10/2011  Procedure: LOOP RECORDER IMPLANT;  Surgeon: Evans Lance, MD;  Location: [facility] CATH LAB;  Service: Cardiovascular;  Laterality: N/A;  . MALONEY DILATION  06/23/2012   Procedure: [name] DILATION;  Surgeon: Gatha Mayer, MD;  [facility] ENDOSCOPY;  Service: Endoscopy;;  54 fr  . MASTECTOMY Left 05/17/2015   total   . NEPHRECTOMY  2008   Laparoscopic right nephrectomy, renal cell carcinoma  . PACEMAKER INSERTION  08/21/2012   Medtronic Adapta L dual-chamber pacemaker  . PERCUTANEOUS CORONARY STENT INTERVENTION (PCI-S) N/A 04/09/2013   Procedure: PERCUTANEOUS CORONARY STENT INTERVENTION (PCI-S);  Surgeon: Burnell Blanks, MD;  Location: [facility] CATH LAB;  Service: Cardiovascular;  Laterality: N/A;  . PERMANENT PACEMAKER INSERTION N/A 08/21/2012   Procedure: PERMANENT PACEMAKER INSERTION;  Surgeon: Evans Lance, MD;  Location: [facility] CATH LAB;  Service: Cardiovascular;  Laterality: N/A;  . RIGHT HEART CATH N/A 03/18/2019   Procedure: RIGHT HEART CATH;  Surgeon: Larey Dresser, MD;  Location: [place] CV LAB;  Service: Cardiovascular;  Laterality: N/A;  . RIGHT HEART CATH AND CORONARY/GRAFT ANGIOGRAPHY N/A 04/19/2017   Procedure: RIGHT HEART CATH AND CORONARY/GRAFT ANGIOGRAPHY;  Surgeon: Larey Dresser, MD;  Location: [place] CV LAB;  Service: Cardiovascular;  Laterality: N/A;  . TEE WITHOUT CARDIOVERSION N/A 12/07/2015   Procedure: TRANSESOPHAGEAL ECHOCARDIOGRAM (TEE);  Surgeon: Larey Dresser, MD;  Location: [place];  Service: Cardiovascular;  Laterality: N/A;  . TOTAL ABDOMINAL HYSTERECTOMY W/ BILATERAL SALPINGOOPHORECTOMY  2000   endometriosis and ovarian cysts  . TOTAL MASTECTOMY Left 05/17/2015   Procedure: LEFT TOTAL MASTECTOMY;  Surgeon: Rolm Bookbinder, MD;  Location: [place];  Service: General;  Laterality: Left;  . TUMOR EXCISION  1981   removal of Hodgkins lymphoma   Social History   Occupational History  . Occupation: retired    Comment:  elementary principal  Tobacco Use  . Smoking status: Never Smoker  . Smokeless tobacco: Never Used  Substance and Sexual Activity  . Alcohol use: No    Alcohol/week: 0.0 standard drinks  . Drug use: No  . Sexual activity: Yes

## 2019-06-11 ENCOUNTER — Other Ambulatory Visit: Payer: Self-pay

## 2019-06-11 ENCOUNTER — Ambulatory Visit
Admission: RE | Admit: 2019-06-11 | Discharge: 2019-06-11 | Disposition: A | Discharge status: Home / Self Care | Payer: Medicare Other | Source: Ambulatory Visit | Attending: Pediatrics | Admitting: Pediatrics

## 2019-06-11 DIAGNOSIS — M79604 Pain in right leg: Secondary | ICD-10-CM

## 2019-06-12 ENCOUNTER — Other Ambulatory Visit: Payer: Self-pay

## 2019-06-12 ENCOUNTER — Ambulatory Visit: Payer: Medicare Other | Admitting: Orthopaedic Surgery

## 2019-06-12 DIAGNOSIS — S8011XA Contusion of right lower leg, initial encounter: Secondary | ICD-10-CM

## 2019-06-12 NOTE — Progress Notes (Signed)
Office Visit Note   Patient: [name] [name]           Date of Birth: [birth year]           MRN: [id number] Visit Date: 06/12/2019              Requested by: [name], MD [address] [place],  [place] [address] PCP: [name], MD   Assessment & Plan: Visit Diagnoses:  1. Traumatic hematoma of right lower leg, initial encounter     Plan: CT scan reviewed today which is negative for fracture.  It is consistent with subcutaneous hematoma.  We will continue to treat this with compression elevation and rest.  Ice and heat as needed.  Questions encouraged and answered.  Follow-up in 4 weeks for recheck.  Follow-Up Instructions: Return in about 4 weeks (around 07/10/2019).   Orders:  No orders of the defined types were placed in this encounter.  No orders of the defined types were placed in this encounter.     Procedures: No procedures performed   Clinical Data: No additional findings.   Subjective: No chief complaint on file.   Patient returns today for review of her CT scan.  She states that she continues to have pain and discomfort and swelling in her right lower leg.  She has been wrapping this with an Ace wrap.   Review of Systems  Constitutional: Negative.   HENT: Negative.   Eyes: Negative.   Respiratory: Negative.   Cardiovascular: Negative.   Endocrine: Negative.   Musculoskeletal: Negative.   Neurological: Negative.   Hematological: Negative.   Psychiatric/Behavioral: Negative.   All other systems reviewed and are negative.    Objective: Vital Signs: There were no vitals taken for this visit.  Physical Exam Vitals signs and nursing note reviewed.  Constitutional:      Appearance: She is well-developed.  Pulmonary:     Effort: Pulmonary effort is normal.  Skin:    General: Skin is warm.     Capillary Refill: Capillary refill takes less than 2 seconds.  Neurological:     Mental Status: She is alert and oriented to person, place,  and time.  Psychiatric:        Behavior: Behavior normal.        Thought Content: Thought content normal.        Judgment: Judgment normal.     Ortho Exam Right lower leg exam shows ecchymosis and mild swelling with a focal area of hematoma.  There is a stable scab overlying hematoma.  There is no skin compromise.  No evidence of infection.  Specialty Comments:  No specialty comments available.  Imaging: No results found.   PMFS History: Patient Active Problem List   Diagnosis Date Noted  . Traumatic hematoma of right lower leg 06/12/2019  . Right hip pain 04/14/2019  . OSA on CPAP 04/24/2017  . Genetic testing 01/14/2017  . Insulin pump status 09/12/2016  . S/P left mastectomy 07/29/2016  . History of breast cancer 03/29/2016  . Dense breast 12/11/2015  . Chronic anticoagulation 12/11/2015  . Hx of radiation therapy 04/23/2015  . Thrombocytopenia ([place]) 04/23/2015  . 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 Radiation therapy complication 71/69/6789  . History of nodular sclerosis Hodgkin's disease 10/27/2014  . Insulin dependent type 1 diabetes mellitus ([place]) 10/07/2014  . Claudication ([place]) 07/29/2014  . PAF (paroxysmal atrial fibrillation) ([place])   .  Coronary artery disease involving native coronary artery of native heart with unstable angina pectoris ([place])   . Unstable angina ([place]) 04/11/2013  . Chronic diastolic CHF (congestive heart failure) ([place]) 04/03/2013  . Artificial cardiac pacemaker 03/12/2013  . Chronic kidney disease (CKD), stage III (moderate) 03/12/2013  . Big thyroid 03/12/2013  . Encounter for fitting or adjustment of insulin pump 03/12/2013  . Congestive heart failure ([place]) 03/12/2013  . Lipoma of neck 09/23/2012  . Pacemaker 09/08/2012  . Atrial flutter ([place]) 07/23/2012  . Sick sinus syndrome ([place]) 07/21/2012  . Gastroparesis due to DM ([place]) 05/01/2012  .  Hypercalcemia   . Cholelithiasis 07/20/2011  . Aortic stenosis status post 19 mm Edwards pericardial valve replacement 2011   . Arteriosclerotic cardiovascular disease (ASCVD)   . Osteopenia   . NASH (nonalcoholic steatohepatitis)   . Renal cell carcinoma (HCC)     Class: History of  . Duodenal ulcer     Class: History of  . Ischemic colitis     Class: History of  . Gastroesophageal reflux disease   . Hypertension   . Small bowel obstruction ([place])   . Hyperlipidemia 04/11/2010  . Colonic polyps, adenomatous 09/29/2009   Past Medical History:  Diagnosis Date  . Anxiety   . Aortic stenosis    a. bicuspid aortic valve; mean gradient of 20 mmHg in 2/10; b. s/p bioprosth AVR (19-mm Edwards pericardial valve) with a redo coronary artery bypass graft procedure in 07/2009; postoperative Dressler's syndrome    . Arthritis   . Breast cancer ([place])   . Carcinoma, renal cell ([place]) 05/2007   Laparoscopic right nephrectomy  . 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 Chronic diastolic CHF (congestive heart failure) ([place])    a. 9.2014 EchoP EF 60-65%, no rwma, bioprosth AVR, mean gradient of 16mHg, mild to mod MR, PASP 555mg.  . CKD (chronic kidney disease), stage II   . Colitis, ischemic (HCNewark2008  . Coronary artery disease    a. initial CABG with SVG-RCA in 12/05. b. redo SVG-RCA and bioprosthetic AVR in 1/11. d. 9/14 - PCI with DES to distal LM/proximal LAD was done rather than bypass as she would have been a poor candidate for redo sternotomy. d. S/p DES to prox LM 05/2014.  . Diabetes mellitus 03/2010    TYPE II: Hemoglobin A1c of 7.4 in  03/2010; 8.4 in 06/2010; treated with insulin pump  . Duodenal ulcer    Remote; H. pylori positive  . Gastroparesis due to DM (HCSt. James10/04/2012  . Genetic testing 01/14/2017   Ms. Daoust underwent genetic counseling and testing for hereditary cancer syndromes on 01/01/2017. Her results were negative for mutations in all 83 genes analyzed by [facility]'s  83-gene Common Hereditary Cancers Panel. Genes analyzed include: ALK, APC, ATM, AXIN2, BAP1, BARD1, BLM, BMPR1A, BRCA1, BRCA2, BRIP1, CASR, CDC73, CDH1, CDK4, CDKN1B, CDKN1C, CDKN2A, CEBPA, CHEK2, CTNNA1, DICER1, DIS3L2,   . Hodgkin's disease (HCTheba1991   Mantle radiation therapy  . Hodgkin's disease (HCFunk  . Hyperlipidemia   . Hyperplastic gastric polyp 06/23/2012  . Hypertension   . Iron deficiency anemia    a. 03/2013 EGD: essentially normal. Possible slow GIB.  . Migraines   . NASH (nonalcoholic steatohepatitis) 1999   -biopsy in 1999  . OSA on CPAP 04/24/2017   Moderate with AHI 22/hr now on CPAP at 18cm H2o  . Osteopenia     hip on DEXA in October 2007.  . Osteoporosis   . Oxygen deficiency   .  Pacemaker    six sinus syndrome  . PAF (paroxysmal atrial fibrillation) ([place])    a.  h/o brief episodes noted on ppm interrogation. b. given GI bleeding and need to be on both ASA 81 and Brilinta, risks likely outweigh benefits of anticoagulation.   . Renal insufficiency   . Small bowel obstruction (HCC)    Recurrent; resolved after resection of a lipoma  . Syncope    a. H/o recurrent syncope with pauses on loop recorder s/p Medtronic pacemaker 07/2012.    Family History  Problem Relation Age of Onset  . Heart attack Mother        d.61  . Heart disease Mother   . Heart attack Father        d.60  . Diabetes Father   . Heart disease Father   . Lung cancer Sister        d.61 metastases to brain. History of smoking. Maternal half-sister.  . Cancer Sister 30       unspecified gynecologic cancer (either cervical or ovarian) in her 40s.  Marland Kitchen Heart attack Maternal Grandmother   . Cancer Maternal Grandfather 26       d.52s oral cancer  . Hypertension Brother   . Glaucoma Brother   . Breast cancer Sister 31       d.42 from metastatic disease. Paternal half-sister.  . Colon cancer Neg Hx   . Stomach cancer Neg Hx     Past Surgical History:  Procedure Laterality Date  . ABDOMINAL  HYSTERECTOMY    . AORTIC VALVE REPLACEMENT  2011   Aortic valve replacement surgery with a 19 mm bioprosthetic device post redo CABG January 2011.  Marland Kitchen BONE MARROW BIOPSY    . BREAST EXCISIONAL BIOPSY     benign, bilateral  . CARDIAC CATHETERIZATION    . CERVICAL BIOPSY    . COLONOSCOPY     Multiple with adenomatous polyps  . COLONOSCOPY  06/23/2012   Procedure: COLONOSCOPY;  Surgeon: Gatha Mayer, MD;  [facility];  Service: Endoscopy;  Laterality: N/A;  . CORONARY ARTERY BYPASS GRAFT  2005, 2011   CABG-most recent SVG to RCA-12/05; RCA occluded with patent graft in 2006; Redo bypass in 07/2009  . ESOPHAGOGASTRODUODENOSCOPY    . ESOPHAGOGASTRODUODENOSCOPY  06/23/2012   Procedure: ESOPHAGOGASTRODUODENOSCOPY (EGD);  Surgeon: Gatha Mayer, MD;  [facility];  Service: Endoscopy;  Laterality: N/A;  . ESOPHAGOGASTRODUODENOSCOPY N/A 04/06/2013   Procedure: ESOPHAGOGASTRODUODENOSCOPY (EGD);  Surgeon: Irene Shipper, MD;  Location: [facility] ENDOSCOPY;  Service: Endoscopy;  Laterality: N/A;  . EXPLORATORY LAPAROTOMY W/ BOWEL RESECTION     Small bowel resection of lipoma  . GIVENS CAPSULE STUDY N/A 04/30/2013   Procedure: GIVENS CAPSULE STUDY;  Surgeon: Gatha Mayer, MD;  Location: [place];  Service: Endoscopy;  Laterality: N/A;  . LEFT AND RIGHT HEART CATHETERIZATION WITH CORONARY ANGIOGRAM N/A 04/07/2013   Procedure: LEFT AND RIGHT HEART CATHETERIZATION WITH CORONARY ANGIOGRAM;  Surgeon: Larey Dresser, MD;  Location: [facility] CATH LAB;  Service: Cardiovascular;  Laterality: N/A;  . LEFT ATRIAL APPENDAGE OCCLUSION N/A 02/16/2016   Watchman not placed - nickel allergy  . LEFT HEART CATHETERIZATION WITH CORONARY/GRAFT ANGIOGRAM N/A 06/07/2014   Procedure: LEFT HEART CATHETERIZATION WITH Beatrix Fetters;  Surgeon: Burnell Blanks, MD;  Location: [facility] CATH LAB;  Service: Cardiovascular;  Laterality: N/A;  . LIVER BIOPSY    . LOOP RECORDER IMPLANT N/A 12/10/2011   Procedure:  LOOP RECORDER IMPLANT;  Surgeon: Evans Lance,  MD;  Location: [place] CATH LAB;  Service: Cardiovascular;  Laterality: N/A;  . MALONEY DILATION  06/23/2012   Procedure: [name] DILATION;  Surgeon: Gatha Mayer, MD;  [facility] ENDOSCOPY;  Service: Endoscopy;;  54 fr  . MASTECTOMY Left 05/17/2015   total   . NEPHRECTOMY  2008   Laparoscopic right nephrectomy, renal cell carcinoma  . PACEMAKER INSERTION  08/21/2012   Medtronic Adapta L dual-chamber pacemaker  . PERCUTANEOUS CORONARY STENT INTERVENTION (PCI-S) N/A 04/09/2013   Procedure: PERCUTANEOUS CORONARY STENT INTERVENTION (PCI-S);  Surgeon: Burnell Blanks, MD;  Location: [facility] CATH LAB;  Service: Cardiovascular;  Laterality: N/A;  . PERMANENT PACEMAKER INSERTION N/A 08/21/2012   Procedure: PERMANENT PACEMAKER INSERTION;  Surgeon: Evans Lance, MD;  Location: [facility] CATH LAB;  Service: Cardiovascular;  Laterality: N/A;  . RIGHT HEART CATH N/A 03/18/2019   Procedure: RIGHT HEART CATH;  Surgeon: Larey Dresser, MD;  Location: [place] CV LAB;  Service: Cardiovascular;  Laterality: N/A;  . RIGHT HEART CATH AND CORONARY/GRAFT ANGIOGRAPHY N/A 04/19/2017   Procedure: RIGHT HEART CATH AND CORONARY/GRAFT ANGIOGRAPHY;  Surgeon: Larey Dresser, MD;  Location: [place] CV LAB;  Service: Cardiovascular;  Laterality: N/A;  . TEE WITHOUT CARDIOVERSION N/A 12/07/2015   Procedure: TRANSESOPHAGEAL ECHOCARDIOGRAM (TEE);  Surgeon: Larey Dresser, MD;  Location: [place];  Service: Cardiovascular;  Laterality: N/A;  . TOTAL ABDOMINAL HYSTERECTOMY W/ BILATERAL SALPINGOOPHORECTOMY  2000   endometriosis and ovarian cysts  . TOTAL MASTECTOMY Left 05/17/2015   Procedure: LEFT TOTAL MASTECTOMY;  Surgeon: Rolm Bookbinder, MD;  Location: [place] [facility];  Service: General;  Laterality: Left;  . TUMOR EXCISION  1981   removal of Hodgkins lymphoma   Social History   Occupational History  . Occupation: retired    Comment: elementary principal  Tobacco Use  .  Smoking status: Never Smoker  . Smokeless tobacco: Never Used  Substance and Sexual Activity  . Alcohol use: No    Alcohol/week: 0.0 standard drinks  . Drug use: No  . Sexual activity: Yes

## 2019-06-13 ENCOUNTER — Other Ambulatory Visit (HOSPITAL_COMMUNITY)
Admission: RE | Admit: 2019-06-13 | Discharge: 2019-06-13 | Disposition: A | Discharge status: Home / Self Care | Payer: Medicare Other | Source: Ambulatory Visit | Attending: Cardiology | Admitting: Cardiology

## 2019-06-13 DIAGNOSIS — Z20828 Contact with and (suspected) exposure to other viral communicable diseases: Secondary | ICD-10-CM | POA: Diagnosis not present

## 2019-06-13 DIAGNOSIS — Z01812 Encounter for preprocedural laboratory examination: Secondary | ICD-10-CM | POA: Diagnosis present

## 2019-06-15 ENCOUNTER — Encounter (HOSPITAL_BASED_OUTPATIENT_CLINIC_OR_DEPARTMENT_OTHER): Payer: Self-pay

## 2019-06-15 LAB — NOVEL CORONAVIRUS, NAA (HOSP ORDER, SEND-OUT TO REF LAB; TAT 18-24 HRS): SARS-CoV-2, NAA: NOT DETECTED

## 2019-06-16 ENCOUNTER — Ambulatory Visit (HOSPITAL_BASED_OUTPATIENT_CLINIC_OR_DEPARTMENT_OTHER): Payer: Medicare Other | Attending: Cardiology | Admitting: Cardiology

## 2019-06-16 VITALS — Temp 97.8°F | Ht 63.0 in | Wt 106.0 lb

## 2019-06-16 DIAGNOSIS — Z79899 Other long term (current) drug therapy: Secondary | ICD-10-CM | POA: Insufficient documentation

## 2019-06-16 DIAGNOSIS — G4733 Obstructive sleep apnea (adult) (pediatric): Secondary | ICD-10-CM | POA: Diagnosis not present

## 2019-06-17 ENCOUNTER — Other Ambulatory Visit (HOSPITAL_BASED_OUTPATIENT_CLINIC_OR_DEPARTMENT_OTHER): Payer: Self-pay

## 2019-06-17 DIAGNOSIS — G4733 Obstructive sleep apnea (adult) (pediatric): Secondary | ICD-10-CM

## 2019-06-19 NOTE — Procedures (Addendum)
   Patient Name: [name] [name], Mcmullen Date: 06/16/2019 Gender: Female D.O.B: Feb 10, 1951 Age (years): 68 Referring Provider: Fransico Him MD, ABSM Height (inches): 63 Interpreting Physician: Fransico Him MD, ABSM Weight (lbs): 106 RPSGT: Carolin Coy BMI: 19 MRN: [id number] Neck Size: 11.50  CLINICAL INFORMATION The patient is referred for a BiPAP titration to treat sleep apnea.  SLEEP STUDY TECHNIQUE As per the AASM Manual for the Scoring of Sleep and Associated Events v2.3 (April 2016) with a hypopnea requiring 4% desaturations.  The channels recorded and monitored were frontal, central and occipital EEG, electrooculogram (EOG), submentalis EMG (chin), nasal and oral airflow, thoracic and abdominal wall motion, anterior tibialis EMG, snore microphone, electrocardiogram, and pulse oximetry. Bilevel positive airway pressure (BPAP) was initiated at the beginning of the study and titrated to treat sleep-disordered breathing.  MEDICATIONS Medications self-administered by patient taken the night of the study : MAGNESIUM OXIDE, XANAX  RESPIRATORY PARAMETERS Optimal IPAP Pressure (cm): 19  AHI at Optimal Pressure (/hr) 1.0 Optimal EPAP Pressure (cm):15  Overall Minimal O2 (%):93.0  Minimal O2 at Optimal Pressure (%): 94.0  SLEEP ARCHITECTURE Start Time10:55:43 PM  Stop Time:5:02:12 AM Total Time (min):366.5  Total Sleep Time (min): 340.4 Sleep Latency (min): 5.1  Sleep Efficiency (%): 92.9%  REM Latency (min): 90.0  WASO (min): 21.0 Stage N1 (%): 3.8%  Stage N2 (%): 84.3%  Stage N3 (%): 1.3%  Stage R (%): 10.6 Supine (%):73.57  Arousal Index (/hr):21.9   CARDIAC DATA The 2 lead EKG demonstrated sinus rhythm. The mean heart rate was 60.9 beats per minute. Other EKG findings include: None.  LEG MOVEMENT DATA The total Periodic Limb Movements of Sleep (PLMS) were 0. The PLMS index was 0.0. A PLMS index of <15 is considered normal in adults.  IMPRESSIONS - An optimal  PAP pressure was selected for this patient ( 19/15 cm of water) - Central sleep apnea was not noted during this titration (CAI = 4.6/h). - Significant oxygen desaturations were not observed during this titration (min O2 = 93.0%). - The patient snored with soft snoring volume. - No cardiac abnormalities were observed during this study. - Clinically significant periodic limb movements were not noted during this study. Arousals associated with PLMs were rare.  DIAGNOSIS - Obstructive Sleep Apnea (327.23 [G47.33 ICD-10])  RECOMMENDATIONS - Trial of BiPAP therapy on 19/15 cm H2O with a Small size Resmed Full Face Mask AirFit F10 for Her mask and heated humidification. - Avoid alcohol, sedatives and other CNS depressants that may worsen sleep apnea and disrupt normal sleep architecture. - Sleep hygiene should be reviewed to assess factors that may improve sleep quality. - Weight management and regular exercise should be initiated or continued. - Return to Sleep Center for re-evaluation after 10 weeks of therapy  Signed: Fransico Him, MD [facility] 06/19/2019

## 2019-06-23 ENCOUNTER — Ambulatory Visit: Payer: Medicare Other | Admitting: Orthopaedic Surgery

## 2019-06-24 ENCOUNTER — Ambulatory Visit: Payer: Self-pay

## 2019-06-24 ENCOUNTER — Other Ambulatory Visit: Payer: Self-pay

## 2019-06-24 ENCOUNTER — Encounter: Payer: Self-pay | Admitting: Orthopaedic Surgery

## 2019-06-24 ENCOUNTER — Telehealth: Payer: Self-pay | Admitting: *Deleted

## 2019-06-24 ENCOUNTER — Ambulatory Visit: Payer: Medicare Other | Admitting: Orthopaedic Surgery

## 2019-06-24 VITALS — Ht 63.0 in | Wt 106.0 lb

## 2019-06-24 DIAGNOSIS — M7989 Other specified soft tissue disorders: Secondary | ICD-10-CM

## 2019-06-24 DIAGNOSIS — M25561 Pain in right knee: Secondary | ICD-10-CM

## 2019-06-24 DIAGNOSIS — M79671 Pain in right foot: Secondary | ICD-10-CM

## 2019-06-24 DIAGNOSIS — M79604 Pain in right leg: Secondary | ICD-10-CM | POA: Diagnosis not present

## 2019-06-24 NOTE — Progress Notes (Signed)
Office Visit Note   Patient: [name] [name]           Date of Birth: 29-Jun-1951           MRN: [id number] Visit Date: 06/24/2019              Requested by: [name], MD [place] [place],  [place] [address] PCP: [name], MD   Assessment & Plan: Visit Diagnoses:  1. Acute pain of right knee   2. Pain in right foot   3. Pain and swelling of right lower extremity     Plan: Impression is questionable right lower extremity superficial or deep vein thrombosis.  We will arrange for the patient to have a venous Doppler ultrasound.  Should she develop any chest pain or shortness of breath she has been instructed to go to the ED.  Otherwise, she will follow-up with [name] in her regularly scheduled appointment for her hematoma.  Follow-Up Instructions: Return for at regularly scheduled appointment.   Orders:  Orders Placed This Encounter  Procedures  . XR KNEE 3 VIEW RIGHT  . XR Foot Complete Right  . VAS [name] LOWER EXTREMITY VENOUS (DVT)   No orders of the defined types were placed in this encounter.     Procedures: No procedures performed   Clinical Data: No additional findings.   Subjective: Chief Complaint  Patient presents with  . Right Knee - Follow-up, Pain  . Right Leg - Follow-up    HPI patient is a pleasant 68 year old female who comes in today with right posterior knee and dorsal foot pain.  In regards to the right knee, she noticed this pain approximately 4 days ago.  No known injury or change in activity.  The pain she has is to the posterior knee.  Worse with ambulation and extension of the knee.  She does get relief with a heating pad.  In regards to the right foot, she noticed pain to the dorsum of the foot yesterday.  This is over a superficial vein to the dorsal aspect.  No known injury or change in activity there.  She denies any chest pain or shortness of breath.  She is a non-smoker.  No history of DVT or PE.  She is quite sedentary as she  is currently healing from right distal tibia hematoma.  She has a history of multiple cancers and is on chronic Brilinta.  Review of Systems as detailed in HPI.  All others reviewed and are negative.   Objective: Vital Signs: Ht 5' 3"  (1.6 m)   Wt 106 lb (48.1 kg)   BMI 18.78 kg/m   Physical Exam well-developed well-nourished female no acute distress.  Alert and oriented x3.  Ortho Exam examination of her right lower extremity reveals moderate tenderness to the popliteal fossa and proximal calf.  Negative Homans.  There is no erythema or swelling.  Right foot shows moderate tenderness over a dorsal superficial vein.  No bony tenderness.  She is neurovascularly intact distally.  Specialty Comments:  No specialty comments available.  Imaging: Xr Foot Complete Right  Result Date: 06/24/2019 No acute or structural abnormalities  Xr Knee 3 View Right  Result Date: 06/24/2019 No structural or acute abnormalities    PMFS History: Patient Active Problem List   Diagnosis Date Noted  . Traumatic hematoma of right lower leg 06/12/2019  . Right hip pain 04/14/2019  . OSA on CPAP 04/24/2017  . Genetic testing 01/14/2017  . Insulin pump status  09/12/2016  . S/P left mastectomy 07/29/2016  . History of breast cancer 03/29/2016  . Dense breast 12/11/2015  . Chronic anticoagulation 12/11/2015  . Hx of radiation therapy 04/23/2015  . Thrombocytopenia ([place]) 04/23/2015  . 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 Radiation therapy complication 62/95/2841  . History of nodular sclerosis Hodgkin's disease 10/27/2014  . Insulin dependent type 1 diabetes mellitus ([place]) 10/07/2014  . Claudication ([place]) 07/29/2014  . PAF (paroxysmal atrial fibrillation) ([place])   . Coronary artery disease involving native coronary artery of native heart with unstable angina pectoris ([place])   . Unstable angina ([place]) 04/11/2013   . Chronic diastolic CHF (congestive heart failure) ([place]) 04/03/2013  . Artificial cardiac pacemaker 03/12/2013  . Chronic kidney disease (CKD), stage III (moderate) 03/12/2013  . Big thyroid 03/12/2013  . Encounter for fitting or adjustment of insulin pump 03/12/2013  . Congestive heart failure ([place]) 03/12/2013  . Lipoma of neck 09/23/2012  . Pacemaker 09/08/2012  . Atrial flutter ([place]) 07/23/2012  . Sick sinus syndrome ([place]) 07/21/2012  . Gastroparesis due to DM ([place]) 05/01/2012  . Hypercalcemia   . Cholelithiasis 07/20/2011  . Aortic stenosis status post 19 mm Edwards pericardial valve replacement 2011   . Arteriosclerotic cardiovascular disease (ASCVD)   . Osteopenia   . NASH (nonalcoholic steatohepatitis)   . Renal cell carcinoma (HCC)     Class: History of  . Duodenal ulcer     Class: History of  . Ischemic colitis     Class: History of  . Gastroesophageal reflux disease   . Hypertension   . Small bowel obstruction ([place])   . Hyperlipidemia 04/11/2010  . Colonic polyps, adenomatous 09/29/2009   Past Medical History:  Diagnosis Date  . Anxiety   . Aortic stenosis    a. bicuspid aortic valve; mean gradient of 20 mmHg in 2/10; b. s/p bioprosth AVR (19-mm Edwards pericardial valve) with a redo coronary artery bypass graft procedure in 07/2009; postoperative Dressler's syndrome    . Arthritis   . Breast cancer ([place])   . Carcinoma, renal cell ([place]) 05/2007   Laparoscopic right nephrectomy  . 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 Chronic diastolic CHF (congestive heart failure) ([place])    a. 9.2014 EchoP EF 60-65%, no rwma, bioprosth AVR, mean gradient of 73mHg, mild to mod MR, PASP 55mg.  . CKD (chronic kidney disease), stage II   . Colitis, ischemic ([facility]2008  . Coronary artery disease    a. initial CABG with SVG-RCA in 12/05. b. redo SVG-RCA and bioprosthetic AVR in 1/11. d. 9/14 - PCI with DES to distal LM/proximal LAD was done rather than bypass as she would have  been a poor candidate for redo sternotomy. d. S/p DES to prox LM 05/2014.  . Diabetes mellitus 03/2010    TYPE II: Hemoglobin A1c of 7.4 in  03/2010; 8.4 in 06/2010; treated with insulin pump  . Duodenal ulcer    Remote; H. pylori positive  . Gastroparesis due to DM ([place]10/04/2012  . Genetic testing 01/14/2017   Ms. Bangerter underwent genetic counseling and testing for hereditary cancer syndromes on 01/01/2017. Her results were negative for mutations in all 83 genes analyzed by [facility]'s 83-gene Common Hereditary Cancers Panel. Genes analyzed include: ALK, APC, ATM, AXIN2, BAP1, BARD1, BLM, BMPR1A, BRCA1, BRCA2, BRIP1, CASR, CDC73, CDH1, CDK4, CDKN1B, CDKN1C, CDKN2A, CEBPA, CHEK2, CTNNA1, DICER1, DIS3L2,   . Hodgkin's disease ([facility]  1991   Mantle radiation therapy  . Hodgkin's disease ([place])   . Hyperlipidemia   . Hyperplastic gastric polyp 06/23/2012  . Hypertension   . Iron deficiency anemia    a. 03/2013 EGD: essentially normal. Possible slow GIB.  . Migraines   . NASH (nonalcoholic steatohepatitis) 1999   -biopsy in 1999  . OSA on CPAP 04/24/2017   Moderate with AHI 22/hr now on CPAP at 18cm H2o  . Osteopenia     hip on DEXA in October 2007.  . Osteoporosis   . Oxygen deficiency   . Pacemaker    six sinus syndrome  . PAF (paroxysmal atrial fibrillation) ([place])    a.  h/o brief episodes noted on ppm interrogation. b. given GI bleeding and need to be on both ASA 81 and Brilinta, risks likely outweigh benefits of anticoagulation.   . Renal insufficiency   . Small bowel obstruction (HCC)    Recurrent; resolved after resection of a lipoma  . Syncope    a. H/o recurrent syncope with pauses on loop recorder s/p Medtronic pacemaker 07/2012.    Family History  Problem Relation Age of Onset  . Heart attack Mother        d.61  . Heart disease Mother   . Heart attack Father        d.60  . Diabetes Father   . Heart disease Father   . Lung cancer Sister        d.61 metastases to brain.  History of smoking. Maternal half-sister.  . Cancer Sister 30       unspecified gynecologic cancer (either cervical or ovarian) in her 39s.  Marland Kitchen Heart attack Maternal Grandmother   . Cancer Maternal Grandfather 3       d.52s oral cancer  . Hypertension Brother   . Glaucoma Brother   . Breast cancer Sister 44       d.42 from metastatic disease. Paternal half-sister.  . Colon cancer Neg Hx   . Stomach cancer Neg Hx     Past Surgical History:  Procedure Laterality Date  . ABDOMINAL HYSTERECTOMY    . AORTIC VALVE REPLACEMENT  2011   Aortic valve replacement surgery with a 19 mm bioprosthetic device post redo CABG January 2011.  Marland Kitchen BONE MARROW BIOPSY    . BREAST EXCISIONAL BIOPSY     benign, bilateral  . CARDIAC CATHETERIZATION    . CERVICAL BIOPSY    . COLONOSCOPY     Multiple with adenomatous polyps  . COLONOSCOPY  06/23/2012   Procedure: COLONOSCOPY;  Surgeon: Gatha Mayer, MD;  Location: [facility]COPY;  Service: Endoscopy;  Laterality: N/A;  . CORONARY ARTERY BYPASS GRAFT  2005, 2011   CABG-most recent SVG to RCA-12/05; RCA occluded with patent graft in 2006; Redo bypass in 07/2009  . ESOPHAGOGASTRODUODENOSCOPY    . ESOPHAGOGASTRODUODENOSCOPY  06/23/2012   Procedure: ESOPHAGOGASTRODUODENOSCOPY (EGD);  Surgeon: Gatha Mayer, MD;  Location: [facility];  Service: Endoscopy;  Laterality: N/A;  . ESOPHAGOGASTRODUODENOSCOPY N/A 04/06/2013   Procedure: ESOPHAGOGASTRODUODENOSCOPY (EGD);  Surgeon: Irene Shipper, MD;  Location: [facility] ENDOSCOPY;  Service: Endoscopy;  Laterality: N/A;  . EXPLORATORY LAPAROTOMY W/ BOWEL RESECTION     Small bowel resection of lipoma  . GIVENS CAPSULE STUDY N/A 04/30/2013   Procedure: GIVENS CAPSULE STUDY;  Surgeon: Gatha Mayer, MD;  Location: [facility];  Service: Endoscopy;  Laterality: N/A;  . LEFT AND RIGHT HEART CATHETERIZATION WITH CORONARY ANGIOGRAM N/A 04/07/2013   Procedure: LEFT AND RIGHT  HEART CATHETERIZATION WITH CORONARY ANGIOGRAM;  Surgeon: Larey Dresser, MD;  Location: [facility] CATH LAB;  Service: Cardiovascular;  Laterality: N/A;  . LEFT ATRIAL APPENDAGE OCCLUSION N/A 02/16/2016   Watchman not placed - nickel allergy  . LEFT HEART CATHETERIZATION WITH CORONARY/GRAFT ANGIOGRAM N/A 06/07/2014   Procedure: LEFT HEART CATHETERIZATION WITH Beatrix Fetters;  Surgeon: Burnell Blanks, MD;  Location: [facility] CATH LAB;  Service: Cardiovascular;  Laterality: N/A;  . LIVER BIOPSY    . LOOP RECORDER IMPLANT N/A 12/10/2011   Procedure: LOOP RECORDER IMPLANT;  Surgeon: Evans Lance, MD;  Location: [facility] CATH LAB;  Service: Cardiovascular;  Laterality: N/A;  . MALONEY DILATION  06/23/2012   Procedure: [name] DILATION;  Surgeon: Gatha Mayer, MD;  Location: [facility] ENDOSCOPY;  Service: Endoscopy;;  54 fr  . MASTECTOMY Left 05/17/2015   total   . NEPHRECTOMY  2008   Laparoscopic right nephrectomy, renal cell carcinoma  . PACEMAKER INSERTION  08/21/2012   Medtronic Adapta L dual-chamber pacemaker  . PERCUTANEOUS CORONARY STENT INTERVENTION (PCI-S) N/A 04/09/2013   Procedure: PERCUTANEOUS CORONARY STENT INTERVENTION (PCI-S);  Surgeon: Burnell Blanks, MD;  Location: [facility] CATH LAB;  Service: Cardiovascular;  Laterality: N/A;  . PERMANENT PACEMAKER INSERTION N/A 08/21/2012   Procedure: PERMANENT PACEMAKER INSERTION;  Surgeon: Evans Lance, MD;  Location: [facility] CATH LAB;  Service: Cardiovascular;  Laterality: N/A;  . RIGHT HEART CATH N/A 03/18/2019   Procedure: RIGHT HEART CATH;  Surgeon: Larey Dresser, MD;  Location: [place] CV LAB;  Service: Cardiovascular;  Laterality: N/A;  . RIGHT HEART CATH AND CORONARY/GRAFT ANGIOGRAPHY N/A 04/19/2017   Procedure: RIGHT HEART CATH AND CORONARY/GRAFT ANGIOGRAPHY;  Surgeon: Larey Dresser, MD;  Location: [place] CV LAB;  Service: Cardiovascular;  Laterality: N/A;  . TEE WITHOUT CARDIOVERSION N/A 12/07/2015   Procedure: TRANSESOPHAGEAL ECHOCARDIOGRAM (TEE);  Surgeon: Larey Dresser, MD;  Location: [facility];  Service: Cardiovascular;  Laterality: N/A;  . TOTAL ABDOMINAL HYSTERECTOMY W/ BILATERAL SALPINGOOPHORECTOMY  2000   endometriosis and ovarian cysts  . TOTAL MASTECTOMY Left 05/17/2015   Procedure: LEFT TOTAL MASTECTOMY;  Surgeon: Rolm Bookbinder, MD;  Location: [facility];  Service: General;  Laterality: Left;  . TUMOR EXCISION  1981   removal of Hodgkins lymphoma   Social History   Occupational History  . Occupation: retired    Comment: elementary principal  Tobacco Use  . Smoking status: Never Smoker  . Smokeless tobacco: Never Used  Substance and Sexual Activity  . Alcohol use: No    Alcohol/week: 0.0 standard drinks  . Drug use: No  . Sexual activity: Yes

## 2019-06-24 NOTE — Telephone Encounter (Signed)
-----   Message from Sueanne Margarita, MD sent at 06/19/2019  3:59 PM EST ----- Please let patient know that they had a successful PAP titration and let DME know that orders are in EPIC.  Please set up 10 week OV with me.

## 2019-06-24 NOTE — Telephone Encounter (Signed)
Informed patient of sleep study results and patient understanding was verbalized. Patient understands her sleep study showed  they had a successful PAP titration and let DME know that orders are in EPIC. Please set up 10 week OV with me.   Upon patient request DME selection is [facility]. Patient understands she will be contacted by [facility] to set up her cpap. Patient understands to call if [facility] does not contact her with new setup in a timely manner. Patient understands they will be called once confirmation has been received from [facility] that they have received their new machine to schedule 10 week follow up appointment.  [facility] notified of new cpap order  Please add to airview Patient was grateful for the call and thanked me.

## 2019-06-25 ENCOUNTER — Encounter (HOSPITAL_COMMUNITY): Payer: Self-pay

## 2019-06-25 ENCOUNTER — Ambulatory Visit (HOSPITAL_COMMUNITY)
Admission: RE | Admit: 2019-06-25 | Discharge: 2019-06-25 | Disposition: A | Discharge status: Home / Self Care | Payer: Medicare Other | Source: Ambulatory Visit | Attending: Cardiology | Admitting: Cardiology

## 2019-06-25 DIAGNOSIS — M7989 Other specified soft tissue disorders: Secondary | ICD-10-CM | POA: Insufficient documentation

## 2019-06-25 DIAGNOSIS — M79604 Pain in right leg: Secondary | ICD-10-CM | POA: Diagnosis present

## 2019-06-25 NOTE — Progress Notes (Unsigned)
Right lower venous has been completed and is negative for DVT. Preliminary results can be found under CV proc through chart review.  Wilkie Aye RVT [facility]

## 2019-06-30 NOTE — Progress Notes (Signed)
Remote pacemaker transmission.   

## 2019-07-04 ENCOUNTER — Other Ambulatory Visit (HOSPITAL_COMMUNITY): Payer: Self-pay | Admitting: Cardiology

## 2019-07-08 ENCOUNTER — Other Ambulatory Visit (HOSPITAL_COMMUNITY): Payer: Medicare Other

## 2019-07-08 ENCOUNTER — Encounter: Payer: Medicare Other | Admitting: Family

## 2019-07-08 ENCOUNTER — Encounter (HOSPITAL_COMMUNITY): Payer: Medicare Other

## 2019-07-10 ENCOUNTER — Ambulatory Visit: Payer: Medicare Other | Admitting: Orthopaedic Surgery

## 2019-07-10 ENCOUNTER — Other Ambulatory Visit: Payer: Self-pay

## 2019-07-10 ENCOUNTER — Encounter: Payer: Self-pay | Admitting: Orthopaedic Surgery

## 2019-07-10 VITALS — Ht 63.0 in | Wt 106.0 lb

## 2019-07-10 DIAGNOSIS — S8011XA Contusion of right lower leg, initial encounter: Secondary | ICD-10-CM

## 2019-07-10 NOTE — Progress Notes (Signed)
Office Visit Note   Patient: [name] [name]           Date of Birth: 06-05-1951           MRN: [id number] Visit Date: 07/10/2019              Requested by: [name], MD [address],  [place] [address] PCP: [name], MD   Assessment & Plan: Visit Diagnoses:  1. Hematoma of right lower leg     Plan: Impression is resolving right lower extremity hematoma.  Patient will continue to rest, ice and apply heat as needed.  She will follow-up with [name] as needed.  Follow-Up Instructions: Return if symptoms worsen or fail to improve.   Orders:  No orders of the defined types were placed in this encounter.  No orders of the defined types were placed in this encounter.     Procedures: No procedures performed   Clinical Data: No additional findings.   Subjective: Chief Complaint  Patient presents with  . Right Knee - Follow-up  . Right Leg - Follow-up    HPI patient is a pleasant 68 year old female who comes in today approximately 6 weeks out from sustaining a hematoma to the right lower extremity.  She has been doing well.  She notes intermittent pain which is worse after she has been on her feet.  Overall, doing much better.     Objective: Vital Signs: Ht _0  (1.6 m)   Wt 106 lb (48.1 kg)   BMI 18.78 kg/m     Ortho Exam examination of her right lower extremity reveals a very small hematoma.  This is minimally tender.  She has full sensation distally.  Specialty Comments:  No specialty comments available.  Imaging: No new imaging   PMFS History: Patient Active Problem List   Diagnosis Date Noted  . Traumatic hematoma of right lower leg 06/12/2019  . Right hip pain 04/14/2019  . OSA on CPAP 04/24/2017  . Genetic testing 01/14/2017  . Insulin pump status 09/12/2016  . S/P left mastectomy 07/29/2016  . History of breast cancer 03/29/2016  . Dense breast 12/11/2015  . Chronic anticoagulation 12/11/2015  . Hx of radiation therapy  04/23/2015  . Thrombocytopenia ([place]) 04/23/2015  . 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 Radiation therapy complication 71/12/2692  . History of nodular sclerosis Hodgkin's disease 10/27/2014  . Insulin dependent type 1 diabetes mellitus ([place]) 10/07/2014  . Claudication ([place]) 07/29/2014  . PAF (paroxysmal atrial fibrillation) ([place])   . Coronary artery disease involving native coronary artery of native heart with unstable angina pectoris ([place])   . Unstable angina ([place]) 04/11/2013  . Chronic diastolic CHF (congestive heart failure) ([place]) 04/03/2013  . Artificial cardiac pacemaker 03/12/2013  . Chronic kidney disease (CKD), stage III (moderate) 03/12/2013  . Big thyroid 03/12/2013  . Encounter for fitting or adjustment of insulin pump 03/12/2013  . Congestive heart failure ([place]) 03/12/2013  . Lipoma of neck 09/23/2012  . Pacemaker 09/08/2012  . Atrial flutter ([place]) 07/23/2012  . Sick sinus syndrome ([place]) 07/21/2012  . Gastroparesis due to DM ([place]) 05/01/2012  . Hypercalcemia   . Cholelithiasis 07/20/2011  . Aortic stenosis status post 19 mm Edwards pericardial valve replacement 2011   . Arteriosclerotic cardiovascular disease (ASCVD)   . Osteopenia   . NASH (nonalcoholic steatohepatitis)   . Renal cell carcinoma ([place])  Class: History of  . Duodenal ulcer     Class: History of  . Ischemic colitis     Class: History of  . Gastroesophageal reflux disease   . Hypertension   . Small bowel obstruction ([place])   . Hyperlipidemia 04/11/2010  . Colonic polyps, adenomatous 09/29/2009   Past Medical History:  Diagnosis Date  . Anxiety   . Aortic stenosis    a. bicuspid aortic valve; mean gradient of 20 mmHg in 2/10; b. s/p bioprosth AVR (19-mm Edwards pericardial valve) with a redo coronary artery bypass graft procedure in 07/2009; postoperative Dressler's syndrome    . Arthritis   . Breast cancer  ([place])   . Carcinoma, renal cell ([place]) 05/2007   Laparoscopic right nephrectomy  . 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 Chronic diastolic CHF (congestive heart failure) ([place])    a. 9.2014 EchoP EF 60-65%, no rwma, bioprosth AVR, mean gradient of 14mHg, mild to mod MR, PASP 552mg.  . CKD (chronic kidney disease), stage II   . Colitis, ischemic ([facility]CL[place]2008  . Coronary artery disease    a. initial CABG with SVG-RCA in 12/05. b. redo SVG-RCA and bioprosthetic AVR in 1/11. d. 9/14 - PCI with DES to distal LM/proximal LAD was done rather than bypass as she would have been a poor candidate for redo sternotomy. d. S/p DES to prox LM 05/2014.  . Diabetes mellitus 03/2010    TYPE II: Hemoglobin A1c of 7.4 in  03/2010; 8.4 in 06/2010; treated with insulin pump  . Duodenal ulcer    Remote; H. pylori positive  . Gastroparesis due to DM ([place]10/04/2012  . Genetic testing 01/14/2017   Ms. Deshazo underwent genetic counseling and testing for hereditary cancer syndromes on 01/01/2017. Her results were negative for mutations in all 83 genes analyzed by [facility]'s 83-gene Common Hereditary Cancers Panel. Genes analyzed include: ALK, APC, ATM, AXIN2, BAP1, BARD1, BLM, BMPR1A, BRCA1, BRCA2, BRIP1, CASR, CDC73, CDH1, CDK4, CDKN1B, CDKN1C, CDKN2A, CEBPA, CHEK2, CTNNA1, DICER1, DIS3L2,   . Hodgkin's disease ([facility]CParkers Settlement1991   M[place]le radiation therapy  . Hodgkin's disease (HCSageville  . Hyperlipidemia   . Hyperplastic gastric polyp 06/23/2012  . Hypertension   . Iron deficiency anemia    a. 03/2013 EGD: essentially normal. Possible slow GIB.  . Migraines   . NASH (nonalcoholic steatohepatitis) 1999   -biopsy in 1999  . OSA on CPAP 04/24/2017   Moderate with AHI 22/hr now on CPAP at 18cm H2o  . Osteopenia     hip on DEXA in October 2007.  . Osteoporosis   . Oxygen deficiency   . Pacemaker    six sinus syndrome  . PAF (paroxysmal atrial fibrillation) (HCK[place]   a.  h/o brief episodes noted on ppm interrogation. b.  given GI bleeding and need to be on both ASA 81 and Brilinta, risks likely outweigh benefits of anticoagulation.   . Renal insufficiency   . Small bowel obstruction (H[facility])    Recurrent; resolved after resection of a lipoma  . Syncope    a. H/o recurrent syncope with pauses on loop recorder s/p Medtronic pacemaker 07/2012.    Family History  Problem Relation Age of Onset  . Heart attack Mother        d.61  . Heart disease Mother   . Heart attack Father        d.60  . Diabetes Father   . Heart disease Father   . Lung cancer Sister  d.61 metastases to brain. History of smoking. Maternal half-sister.  . Cancer Sister 30       unspecified gynecologic cancer (either cervical or ovarian) in her 67s.  Marland Kitchen Heart attack Maternal Grandmother   . Cancer Maternal Grandfather 14       d.52s oral cancer  . Hypertension Brother   . Glaucoma Brother   . Breast cancer Sister 93       d.42 from metastatic disease. Paternal half-sister.  . Colon cancer Neg Hx   . Stomach cancer Neg Hx     Past Surgical History:  Procedure Laterality Date  . ABDOMINAL HYSTERECTOMY    . AORTIC VALVE REPLACEMENT  2011   Aortic valve replacement surgery with a 19 mm bioprosthetic device post redo CABG January 2011.  Marland Kitchen BONE MARROW BIOPSY    . BREAST EXCISIONAL BIOPSY     benign, bilateral  . CARDIAC CATHETERIZATION    . CERVICAL BIOPSY    . COLONOSCOPY     Multiple with adenomatous polyps  . COLONOSCOPY  06/23/2012   Procedure: COLONOSCOPY;  Surgeon: Gatha Mayer, MD;  Location: [facility] ENDOSCOPY;  Service: Endoscopy;  Laterality: N/A;  . CORONARY ARTERY BYPASS GRAFT  2005, 2011   CABG-most recent SVG to RCA-12/05; RCA occluded with patent graft in 2006; Redo bypass in 07/2009  . ESOPHAGOGASTRODUODENOSCOPY    . ESOPHAGOGASTRODUODENOSCOPY  06/23/2012   Procedure: ESOPHAGOGASTRODUODENOSCOPY (EGD);  Surgeon: Gatha Mayer, MD;  Location: [facility];  Service: Endoscopy;  Laterality: N/A;  .  ESOPHAGOGASTRODUODENOSCOPY N/A 04/06/2013   Procedure: ESOPHAGOGASTRODUODENOSCOPY (EGD);  Surgeon: Irene Shipper, MD;  [facility] ENDOSCOPY;  Service: Endoscopy;  Laterality: N/A;  . EXPLORATORY LAPAROTOMY W/ BOWEL RESECTION     Small bowel resection of lipoma  . GIVENS CAPSULE STUDY N/A 04/30/2013   Procedure: GIVENS CAPSULE STUDY;  Surgeon: Gatha Mayer, MD;  Location: [facility];  Service: Endoscopy;  Laterality: N/A;  . LEFT AND RIGHT HEART CATHETERIZATION WITH CORONARY ANGIOGRAM N/A 04/07/2013   Procedure: LEFT AND RIGHT HEART CATHETERIZATION WITH CORONARY ANGIOGRAM;  Surgeon: Larey Dresser, MD;  Location[facility] LAB;  Service: Cardiovascular;  Laterality: N/A;  . LEFT ATRIAL APPENDAGE OCCLUSION N/A 02/16/2016   Watchman not placed - nickel allergy  . LEFT HEART CATHETERIZATION WITH CORONARY/GRAFT ANGIOGRAM N/A 06/07/2014   Procedure: LEFT HEART CATHETERIZATION WITH Beatrix Fetters;  Surgeon: Burnell Blanks, MD;  Location: [facility] CATH LAB;  Service: Cardiovascular;  Laterality: N/A;  . LIVER BIOPSY    . LOOP RECORDER IMPLANT N/A 12/10/2011   Procedure: LOOP RECORDER IMPLANT;  Surgeon: Evans Lance, MD;  Location: [facility] CATH LAB;  Service: Cardiovascular;  Laterality: N/A;  . MALONEY DILATION  06/23/2012   Procedure: [name] DILATION;  Surgeon: Gatha Mayer, MD;  Location: [facility] ENDOSCOPY;  Service: Endoscopy;;  54 fr  . MASTECTOMY Left 05/17/2015   total   . NEPHRECTOMY  2008   Laparoscopic right nephrectomy, renal cell carcinoma  . PACEMAKER INSERTION  08/21/2012   Medtronic Adapta L dual-chamber pacemaker  . PERCUTANEOUS CORONARY STENT INTERVENTION (PCI-S) N/A 04/09/2013   Procedure: PERCUTANEOUS CORONARY STENT INTERVENTION (PCI-S);  Surgeon: Burnell Blanks, MD;  Location[facility] C[facility] LAB;  Service: Cardiovascular;  Laterality: N/A;  . PERMANENT PACEMAKER INSERTION N/A 08/21/2012   Procedure: PERMANENT PACEMAKER INSERTION;  Surgeon: Evans Lance, MD;  Location: [facility]  CATH LAB;  Service: Cardiovascular;  Laterality: N/A;  . RIGHT HEART CATH N/A 03/18/2019   Procedure: RIGHT HEART CATH;  Surgeon:  Larey Dresser, MD;  Location: [facility] CV LAB;  Service: Cardiovascular;  Laterality: N/A;  . RIGHT HEART CATH AND CORONARY/GRAFT ANGIOGRAPHY N/A 04/19/2017   Procedure: RIGHT HEART CATH AND CORONARY/GRAFT ANGIOGRAPHY;  Surgeon: Larey Dresser, MD;  Location: [facility] CV LAB;  Service: Cardiovascular;  Laterality: N/A;  . TEE WITHOUT CARDIOVERSION N/A 12/07/2015   Procedure: TRANSESOPHAGEAL ECHOCARDIOGRAM (TEE);  Surgeon: Larey Dresser, MD;  Location: [facility];  Service: Cardiovascular;  Laterality: N/A;  . TOTAL ABDOMINAL HYSTERECTOMY W/ BILATERAL SALPINGOOPHORECTOMY  2000   endometriosis and ovarian cysts  . TOTAL MASTECTOMY Left 05/17/2015   Procedure: LEFT TOTAL MASTECTOMY;  Surgeon: Rolm Bookbinder, MD;  Location: [name];  Service: General;  Laterality: Left;  . TUMOR EXCISION  1981   removal of Hodgkins lymphoma   Social History   Occupational History  . Occupation: retired    Comment: elementary principal  Tobacco Use  . Smoking status: Never Smoker  . Smokeless tobacco: Never Used  Substance and Sexual Activity  . Alcohol use: No    Alcohol/week: 0.0 standard drinks  . Drug use: No  . Sexual activity: Yes

## 2019-07-16 ENCOUNTER — Other Ambulatory Visit (HOSPITAL_COMMUNITY): Payer: Self-pay | Admitting: Cardiology

## 2019-07-21 ENCOUNTER — Encounter: Payer: Self-pay | Admitting: Hematology

## 2019-07-23 ENCOUNTER — Other Ambulatory Visit (HOSPITAL_COMMUNITY): Payer: Self-pay | Admitting: *Deleted

## 2019-07-23 DIAGNOSIS — R928 Other abnormal and inconclusive findings on diagnostic imaging of breast: Secondary | ICD-10-CM

## 2019-07-23 NOTE — Progress Notes (Signed)
Diagnostic mammogram right breast and ultrasound right breast ordered.  Recommended following her screening mammogram on 12/29.   I spoke with patient and advised of the results. She denies feeling any new lumps or bumps because she has known dense breast tissue.  She was given the phone number to [facility] to call and get her mammogram and ultrasound scheduled.  Per her request I faxed the results to her personal fax for her records.  She was also given my phone number to call should she have any questions or concerns.  She verbalizes understanding.

## 2019-07-29 ENCOUNTER — Telehealth (HOSPITAL_COMMUNITY): Payer: Self-pay

## 2019-07-29 ENCOUNTER — Other Ambulatory Visit: Payer: Self-pay

## 2019-07-29 DIAGNOSIS — N184 Chronic kidney disease, stage 4 (severe): Secondary | ICD-10-CM

## 2019-07-29 NOTE — Telephone Encounter (Signed)
The above patient or their representative was contacted and gave the following answers to these questions:         Do you have any of the following symptoms? No  Fever                    Cough                   Shortness of breath  Do  you have any of the following other symptoms?    muscle pain         vomiting,        diarrhea        rash         weakness        red eye        abdominal pain         bruising          bruising or bleeding              joint pain           severe headache    Have you been in contact with someone who was or has been sick in the past 2 weeks? No  Yes                 Unsure                         Unable to assess   Does the person that you were in contact with have any of the following symptoms?   Cough         shortness of breath           muscle pain         vomiting,            diarrhea            rash            weakness           fever            red eye           abdominal pain           bruising  or  bleeding                joint pain                severe headache                 COMMENTS OR ACTION PLAN FOR THIS PATIENT:

## 2019-07-29 NOTE — Telephone Encounter (Addendum)
Patient has changed to [facility] or [facility] for her [phone]

## 2019-07-30 ENCOUNTER — Other Ambulatory Visit: Payer: Self-pay

## 2019-07-30 ENCOUNTER — Ambulatory Visit (HOSPITAL_COMMUNITY)
Admission: RE | Admit: 2019-07-30 | Discharge: 2019-07-30 | Disposition: A | Discharge status: Home / Self Care | Payer: Medicare PPO | Source: Ambulatory Visit | Attending: Vascular Surgery | Admitting: Vascular Surgery

## 2019-07-30 ENCOUNTER — Ambulatory Visit (INDEPENDENT_AMBULATORY_CARE_PROVIDER_SITE_OTHER): Payer: Medicare PPO | Admitting: Vascular Surgery

## 2019-07-30 ENCOUNTER — Other Ambulatory Visit (HOSPITAL_COMMUNITY): Payer: Self-pay | Admitting: Hematology

## 2019-07-30 ENCOUNTER — Ambulatory Visit (INDEPENDENT_AMBULATORY_CARE_PROVIDER_SITE_OTHER)
Admission: RE | Admit: 2019-07-30 | Discharge: 2019-07-30 | Disposition: A | Discharge status: Home / Self Care | Payer: Medicare PPO | Source: Ambulatory Visit | Attending: Family | Admitting: Family

## 2019-07-30 ENCOUNTER — Encounter: Payer: Self-pay | Admitting: Vascular Surgery

## 2019-07-30 VITALS — BP 122/61 | HR 71 | Temp 98.1°F | Resp 20 | Ht 63.0 in | Wt 113.4 lb

## 2019-07-30 DIAGNOSIS — N184 Chronic kidney disease, stage 4 (severe): Secondary | ICD-10-CM | POA: Diagnosis present

## 2019-07-30 DIAGNOSIS — D509 Iron deficiency anemia, unspecified: Secondary | ICD-10-CM

## 2019-07-30 NOTE — Progress Notes (Signed)
Referring Physician: Dr Lorrene Reid  Patient name: [name] [name] MRN: [id number] DOB: 09-Dec-1950 Sex: female  REASON FOR CONSULT: Hemodialysis access  HPI: [name] [name] is a 69 y.o. female, furred for placement of a long-term hemodialysis access.  She is currently CKD4.  She is right-handed.  She is not currently on hemodialysis.  Other medical problems include congestive failure, diabetes, prior right nephrectomy secondary to renal cancer, breast cancer.  All of these are stable except for her breast cancer which is undergoing further evaluation of the right breast next week.   Past Medical History:  Diagnosis Date  . Anxiety   . Aortic stenosis    a. bicuspid aortic valve; mean gradient of 20 mmHg in 2/10; b. s/p bioprosth AVR (19-mm Edwards pericardial valve) with a redo coronary artery bypass graft procedure in 07/2009; postoperative Dressler's syndrome    . Arthritis   . Breast cancer ([place])   . Carcinoma, renal cell ([place]) 05/2007   Laparoscopic right nephrectomy  . 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 Chronic diastolic CHF (congestive heart failure) ([place])    a. 9.2014 EchoP EF 60-65%, no rwma, bioprosth AVR, mean gradient of 26mHg, mild to mod MR, PASP 520mg.  . CKD (chronic kidney disease), stage II   . Colitis, ischemic ([facility]2008  . Coronary artery disease    a. initial CABG with SVG-RCA in 12/05. b. redo SVG-RCA and bioprosthetic AVR in 1/11. d. 9/14 - PCI with DES to distal LM/proximal LAD was done rather than bypass as she would have been a poor candidate for redo sternotomy. d. S/p DES to prox LM 05/2014.  . Diabetes mellitus 03/2010    TYPE II: Hemoglobin A1c of 7.4 in  03/2010; 8.4 in 06/2010; treated with insulin pump  . Duodenal ulcer    Remote; H. pylori positive  . Gastroparesis due to DM ([facility]10/04/2012  . Genetic testing 01/14/2017   Ms. Brubacher underwent genetic counseling and testing for hereditary cancer syndromes on 01/01/2017. Her results were  negative for mutations in all 83 genes analyzed by [facility]'s 83-gene Common Hereditary Cancers Panel. Genes analyzed include: ALK, APC, ATM, AXIN2, BAP1, BARD1, BLM, BMPR1A, BRCA1, BRCA2, BRIP1, CASR, CDC73, CDH1, CDK4, CDKN1B, CDKN1C, CDKN2A, CEBPA, CHEK2, CTNNA1, DICER1, DIS3L2,   . Hodgkin's disease ([facility]1991   Mantle radiation therapy  . Hodgkin's disease ([facility]  . Hyperlipidemia   . Hyperplastic gastric polyp 06/23/2012  . Hypertension   . Iron deficiency anemia    a. 03/2013 EGD: essentially normal. Possible slow GIB.  . Migraines   . NASH (nonalcoholic steatohepatitis) 1999   -biopsy in 1999  . OSA on CPAP 04/24/2017   Moderate with AHI 22/hr now on CPAP at 18cm H2o  . Osteopenia     hip on DEXA in October 2007.  . Osteoporosis   . Oxygen deficiency   . Pacemaker    six sinus syndrome  . PAF (paroxysmal atrial fibrillation) ([facility]   a.  h/o brief episodes noted on ppm interrogation. b. given GI bleeding and need to be on both ASA 81 and Brilinta, risks likely outweigh benefits of anticoagulation.   . Renal insufficiency   . Small bowel obstruction (HCC)    Recurrent; resolved after resection of a lipoma  . Syncope    a. H/o recurrent syncope with pauses on loop recorder s/p Medtronic pacemaker 07/2012.   Past Surgical History:  Procedure Laterality Date  . ABDOMINAL HYSTERECTOMY    . AORTIC  VALVE REPLACEMENT  2011   Aortic valve replacement surgery with a 19 mm bioprosthetic device post redo CABG January 2011.  Marland Kitchen BONE MARROW BIOPSY    . BREAST EXCISIONAL BIOPSY     benign, bilateral  . CARDIAC CATHETERIZATION    . CERVICAL BIOPSY    . COLONOSCOPY     Multiple with adenomatous polyps  . COLONOSCOPY  06/23/2012   Procedure: COLONOSCOPY;  Surgeon: Gatha Mayer, MD;  Location: [facility] ENDOSCOPY;  Service: Endoscopy;  Laterality: N/A;  . CORONARY ARTERY BYPASS GRAFT  2005, 2011   CABG-most recent SVG to RCA-12/05; RCA occluded with patent graft in 2006; Redo bypass in 07/2009  .  ESOPHAGOGASTRODUODENOSCOPY    . ESOPHAGOGASTRODUODENOSCOPY  06/23/2012   Procedure: ESOPHAGOGASTRODUODENOSCOPY (EGD);  Surgeon: Gatha Mayer, MD;  Location: [facility];  Service: Endoscopy;  Laterality: N/A;  . ESOPHAGOGASTRODUODENOSCOPY N/A 04/06/2013   Procedure: ESOPHAGOGASTRODUODENOSCOPY (EGD);  Surgeon: Irene Shipper, MD;  Location: [facility] ENDOSCOPY;  Service: Endoscopy;  Laterality: N/A;  . EXPLORATORY LAPAROTOMY W/ BOWEL RESECTION     Small bowel resection of lipoma  . GIVENS CAPSULE STUDY N/A 04/30/2013   Procedure: GIVENS CAPSULE STUDY;  Surgeon: Gatha Mayer, MD;  Location: [place];  Service: Endoscopy;  Laterality: N/A;  . LEFT AND RIGHT HEART CATHETERIZATION WITH CORONARY ANGIOGRAM N/A 04/07/2013   Procedure: LEFT AND RIGHT HEART CATHETERIZATION WITH CORONARY ANGIOGRAM;  Surgeon: Larey Dresser, MD;  Location[facility] CATH LAB;  Service: Cardiovascular;  Laterality: N/A;  . LEFT ATRIAL APPENDAGE OCCLUSION N/A 02/16/2016   Watchman not placed - nickel allergy  . LEFT HEART CATHETERIZATION WITH CORONARY/GRAFT ANGIOGRAM N/A 06/07/2014   Procedure: LEFT HEART CATHETERIZATION WITH Beatrix Fetters;  Surgeon: Burnell Blanks, MD;  Location: [facility] CATH LAB;  Service: Cardiovascular;  Laterality: N/A;  . LIVER BIOPSY    . LOOP RECORDER IMPLANT N/A 12/10/2011   Procedure: LOOP RECORDER IMPLANT;  Surgeon: Evans Lance, MD;  Location: [facility] CATH LAB;  Service: Cardiovascular;  Laterality: N/A;  . MALONEY DILATION  06/23/2012   Procedure: [name] DILATION;  Surgeon: Gatha Mayer, MD;  Location: [facility] ENDOSCOPY;  Service: Endoscopy;;  54 fr  . MASTECTOMY Left 05/17/2015   total   . NEPHRECTOMY  2008   Laparoscopic right nephrectomy, renal cell carcinoma  . PACEMAKER INSERTION  08/21/2012   Medtronic Adapta L dual-chamber pacemaker  . PERCUTANEOUS CORONARY STENT INTERVENTION (PCI-S) N/A 04/09/2013   Procedure: PERCUTANEOUS CORONARY STENT INTERVENTION (PCI-S);  Surgeon: Burnell Blanks, MD;  Location: [facility] CATH LAB;  Service: Cardiovascular;  Laterality: N/A;  . PERMANENT PACEMAKER INSERTION N/A 08/21/2012   Procedure: PERMANENT PACEMAKER INSERTION;  Surgeon: Evans Lance, MD;  Location: [facility] CATH LAB;  Service: Cardiovascular;  Laterality: N/A;  . RIGHT HEART CATH N/A 03/18/2019   Procedure: RIGHT HEART CATH;  Surgeon: Larey Dresser, MD;  Location: [facility] CV LAB;  Service: Cardiovascular;  Laterality: N/A;  . RIGHT HEART CATH AND CORONARY/GRAFT ANGIOGRAPHY N/A 04/19/2017   Procedure: RIGHT HEART CATH AND CORONARY/GRAFT ANGIOGRAPHY;  Surgeon: Larey Dresser, MD;  Location: [place] CV LAB;  Service: Cardiovascular;  Laterality: N/A;  . TEE WITHOUT CARDIOVERSION N/A 12/07/2015   Procedure: TRANSESOPHAGEAL ECHOCARDIOGRAM (TEE);  Surgeon: Larey Dresser, MD;  Location: [place];  Service: Cardiovascular;  Laterality: N/A;  . TOTAL ABDOMINAL HYSTERECTOMY W/ BILATERAL SALPINGOOPHORECTOMY  2000   endometriosis and ovarian cysts  . TOTAL MASTECTOMY Left 05/17/2015   Procedure: LEFT TOTAL MASTECTOMY;  Surgeon: Rodman Key  Donne Hazel, MD;  Location: [place];  Service: General;  Laterality: Left;  . TUMOR EXCISION  1981   removal of Hodgkins lymphoma    Family History  Problem Relation Age of Onset  . Heart attack Mother        d.61  . Heart disease Mother   . Heart attack Father        d.60  . Diabetes Father   . Heart disease Father   . Lung cancer Sister        d.61 metastases to brain. History of smoking. Maternal half-sister.  . Cancer Sister 30       unspecified gynecologic cancer (either cervical or ovarian) in her 11s.  Marland Kitchen Heart attack Maternal Grandmother   . Cancer Maternal Grandfather 24       d.52s oral cancer  . Hypertension Brother   . Glaucoma Brother   . Breast cancer Sister 51       d.42 from metastatic disease. Paternal half-sister.  . Colon cancer Neg Hx   . Stomach cancer Neg Hx     SOCIAL HISTORY: Social History   Socioeconomic  History  . Marital status: Married    Spouse name: Not on file  . Number of children: 2  . Years of education: Not on file  . Highest education level: Not on file  Occupational History  . Occupation: retired    Comment: elementary principal  Tobacco Use  . Smoking status: Never Smoker  . Smokeless tobacco: Never Used  Substance and Sexual Activity  . Alcohol use: No    Alcohol/week: 0.0 standard drinks  . Drug use: No  . Sexual activity: Yes  Other Topics Concern  . Not on file  Social History Narrative   Retired [employer] principal, married    Started disability September 2013      Has one daughter in [place], works at [employer]. Has a son, Aaron Edelman, who is Retail buyer.   Eats all food groups.   Wear seatbelt.    Attends church. [place], [place]. [place].    Social Determinants of Health   Financial Resource Strain:   . Difficulty of Paying Living Expenses: Not on file  Food Insecurity:   . Worried About Charity fundraiser in the Last Year: Not on file  . Ran Out of Food in the Last Year: Not on file  Transportation Needs:   . Lack of Transportation (Medical): Not on file  . Lack of Transportation (Non-Medical): Not on file  Physical Activity:   . Days of Exercise per Week: Not on file  . Minutes of Exercise per Session: Not on file  Stress:   . Feeling of Stress : Not on file  Social Connections:   . Frequency of Communication with Friends and Family: Not on file  . Frequency of Social Gatherings with Friends and Family: Not on file  . Attends Religious Services: Not on file  . Active Member of Clubs or Organizations: Not on file  . Attends Archivist Meetings: Not on file  . Marital Status: Not on file  Intimate Partner Violence:   . Fear of Current or Ex-Partner: Not on file  . Emotionally Abused: Not on file  . Physically Abused: Not on file  . Sexually Abused: Not on file    Allergies  Allergen Reactions  . Avandia [Rosiglitazone Maleate]  Other (See Comments)    Congestive heart failure  . Cephalexin Anaphylaxis, Swelling and Rash    Extremities  swell , throat swelling   . Clindamycin Hcl Anaphylaxis and Shortness Of Breath  . Humalog [Insulin Lispro] Itching     big knots and whelp   . Lincomycin Anaphylaxis and Shortness Of Breath  . Metformin Other (See Comments)    Congestive heart failure  . Novolog [Insulin Aspart] Hives and Itching    Humalog & Novolog big knots and whelp    . Penicillins Anaphylaxis, Hives, Swelling and Rash    Did it involve swelling of the face/tongue/throat, SOB, or low BP? Yes Did it involve sudden or severe rash/hives, skin peeling, or any reaction on the inside of your mouth or nose? No Did you need to seek medical attention at a hospital or doctor's office? Yes When did it last happen?Childhood allergy  If all above answers are "NO", may proceed with cephalosporin use.   Marland Kitchen Rosiglitazone Other (See Comments)    Congestive heart failure  . Tizanidine Other (See Comments)    Dizziness, Mental Status Changes, Hallucination    . Adhesive [Tape] Other (See Comments)    Tears skin, Please use "paper" tape  . Atorvastatin Other (See Comments)    increased LFT's, no muscle weakness, leg pain Muscle and joint pain increased LFT's  Increased LFTs  . Cholestyramine Other (See Comments)    Liver Disorder Elevated LFTs    . Gentamycin [Gentamicin] Hives, Itching and Rash    Fine red bumps.  Reaction noted post loop recorder implant, where gentamycin was used for irrigation. Topical prep  . Iodinated Diagnostic Agents Other (See Comments)    PAIN DURING LYMPHANGIOGRAM '81, NO ALLERGY TO IV CONTRAST.Has had procedures with Iodine since without problems.   . Limonene Hives, Itching, Rash and Other (See Comments)    Reaction noted post loop recorder implant, where gentamycin was used for irrigation. Topical prep  . Pravastatin Other (See Comments)    In high doses (20-91m) causes  muscle and joint pain  Per pt. "can only take in low doses".   . Prednisone Itching and Other (See Comments)    B/P went high, itching all over.  Tolerates with benadryl   . Ranexa [Ranolazine] Other (See Comments)    Nausea, dizziness, low blood sugar, tingling in right hand, "blood blisters" on tongue   . Rosuvastatin Nausea And Vomiting, Nausea Only and Other (See Comments)    Muscle and joint pain & increased LFTs; tolerates Pravastatin 171m(max)  . Simvastatin Other (See Comments)    Increased LFT's   . Strawberry Extract Hives, Itching, Rash and Other (See Comments)    Reaction noted post loop recorder implant, where gentamycin was used for irrigation. Topical prep.  Can eat fresh strawberries   . Clopidogrel Other (See Comments)    Does not work for patient Medicine was not effective  . Codeine Nausea And Vomiting  . Erythromycin Nausea And Vomiting  . Hydrocodone-Acetaminophen Itching, Rash and Other (See Comments)    itching  . Latex Itching and Other (See Comments)    I"if wear gloves hands get red ,itchy no prioblem if other wear and touch me"  . Neomycin Rash and Other (See Comments)    Redness  . Nickel Itching  . Ranitidine Nausea Only  . Tamiflu [Oseltamivir] Nausea And Vomiting  . Tramadol Nausea Only    Current Outpatient Medications  Medication Sig Dispense Refill  . acyclovir ointment (ZOVIRAX) 5 % Apply 1 application topically every 3 (three) hours as needed (fever blister/cold sores).    . ALPRAZolam (XDuanne Moron  0.5 MG tablet Take 1 tablet (0.5 mg total) by mouth 2 (two) times daily as needed for anxiety. (Patient taking differently: Take 0.25-0.5 mg by mouth See admin instructions. Take 0.25 mg at bedtime, may take a additional 0.25-0.5 mg dose during the day as needed for anxiety) 60 tablet 1  . amLODipine (NORVASC) 2.5 MG tablet TAKE ONE (1) TABLET BY MOUTH EVERY DAY 90 tablet 1  . APIDRA 100 UNIT/ML injection Inject 40 Units into the skin as directed. INFUSE  THROUGH INSULIN PUMP UTD, Bolus depends on carb intake  2  . aspirin EC 81 MG tablet Take 81 mg by mouth at bedtime.    Marland Kitchen BRILINTA 60 MG TABS tablet TAKE ONE TABLET (60MG TOTAL) BY MOUTH TWICE DAILY 180 tablet 0  . clobetasol (OLUX) 0.05 % topical foam Apply 1 application topically 2 (two) times daily as needed (skin flaking).    . Coenzyme Q10 (CO Q 10) 100 MG CAPS Take 100 mg by mouth at bedtime.     . colchicine 0.6 MG tablet Take 0.6 mg by mouth daily as needed (gout flare).    . CONTOUR NEXT TEST test strip USE TO TEST BLOOD SUGAR UP TO EIGHT TIMES A DAY    . diclofenac sodium (VOLTAREN) 1 % GEL Apply 2 g topically 4 (four) times daily. (Patient taking differently: Apply 2 g topically 4 (four) times daily as needed (Pain). ) 300 g 6  . docusate sodium (COLACE) 100 MG capsule Take 100 mg by mouth daily as needed for mild constipation.     . famotidine (PEPCID) 40 MG tablet Take 40 mg by mouth daily.    . Fe Fum-FA-B Cmp-C-Zn-Mg-Mn-Cu (HEMATINIC PLUS VIT/MINERALS) 106-1 MG TABS TAKE ONE TABLET BY MOUTH THREE TIMES A WEEK (MONDAY, WEDNESDAY, AND FRIDAY) OR AS DIRECTED WITH A VITAMIN C 90 tablet 0  . fluticasone (FLONASE) 50 MCG/ACT nasal spray Place 2 sprays into both nostrils daily. (Patient taking differently: Place 2 sprays into both nostrils daily as needed for allergies. ) 48 g 3  . glucagon (GLUCAGON EMERGENCY) 1 MG injection Inject 1 mg into the vein once as needed (low blood sugar).     . isosorbide mononitrate (IMDUR) 120 MG 24 hr tablet TAKE ONE TABLET (120MG TOTAL) BY MOUTH DAILY (Patient taking differently: Take 120 mg by mouth at bedtime. ) 90 tablet 3  . latanoprost (XALATAN) 0.005 % ophthalmic solution Place 1 drop into both eyes at bedtime.    Marland Kitchen loratadine (CLARITIN) 10 MG tablet Take 10 mg by mouth daily as needed for allergies.    . metolazone (ZAROXOLYN) 2.5 MG tablet Take 2.5 mg (1 tab) once as directed by CHF clinic. Call before taking [phone] 3 tablet 0  . metoprolol  tartrate (LOPRESSOR) 50 MG tablet TAKE ONE AND ONE-HALF TABLETS (75MG TOTAL) BY MOUTH TWICE DAILY 270 tablet 2  . mupirocin cream (BACTROBAN) 2 % Apply 1 application topically 2 (two) times daily as needed. 90 g 0  . nitroGLYCERIN (NITRODUR - DOSED IN MG/24 HR) 0.2 mg/hr patch Apply 1/4 patch to area daily prn 30 patch 12  . nitroGLYCERIN (NITROLINGUAL) 0.4 MG/SPRAY spray PLACE 1 SPRAY UNDER THE TONGUE EVERY 5 MINUTES FOR 3 DOSES AS NEEDED FOR CHEST PAIN. 14.7 g 0  . Polyethyl Glycol-Propyl Glycol (SYSTANE) 0.4-0.3 % SOLN Apply 1 drop to eye daily as needed (dry eyes).    . polyethylene glycol (MIRALAX / GLYCOLAX) 17 g packet Take 17 g by mouth daily. 1 capful everyday    .  PRALUENT 150 MG/ML SOAJ INJECT 150MG SUBCUTANEOUSLY EVERY 2 WEEKS (Patient taking differently: Inject 150 mg into the skin every 14 (fourteen) days. ) 6 mL 3  . pravastatin (PRAVACHOL) 10 MG tablet TAKE ONE TABLET (10MG TOTAL) BY MOUTH DAILY 90 tablet 3  . PRESCRIPTION MEDICATION Apply 1 application topically 2 (two) times daily. Erythromycin solution    . silver nitrate applicators 37-10 % applicator Use as needed to stop superficial skin bleeding. 10 each 0  . spironolactone (ALDACTONE) 25 MG tablet TAKE ONE TABLET (25MG TOTAL) BY MOUTH DAILY (Patient taking differently: Take 12.5 mg by mouth daily. ) 90 tablet 1  . torsemide (DEMADEX) 20 MG tablet Take 2 tablets (40 mg total) by mouth daily. Take extra if needed for fluid 180 tablet 3  . ULORIC 40 MG tablet Take 1 tablet (40 mg total) by mouth at bedtime. 90 tablet 3  . UNABLE TO FIND Med Name: So Clean CPAP cleaning system 1 each 0  . VASCEPA 1 g CAPS TAKE 2 CAPSULES (2 GRAMS TOTAL) BY MOUTHTWO TIMES DAILY (Patient taking differently: Take 2 g by mouth 2 (two) times daily. ) 360 capsule 3  . Wheat Dextrin (BENEFIBER) POWD Take 1 Dose by mouth daily.     No current facility-administered medications for this visit.    ROS:   General:  No weight loss, Fever, chills   HEENT: No recent headaches, no nasal bleeding, no visual changes, no sore throat  Neurologic: No dizziness, blackouts, seizures. No recent symptoms of stroke or mini- stroke. No recent episodes of slurred speech, or temporary blindness.  Cardiac: No recent episodes of chest pain/pressure, no shortness of breath at rest.  No shortness of breath with exertion.  Denies history of atrial fibrillation or irregular heartbeat  Vascular: No history of rest pain in feet.  No history of claudication.  No history of non-healing ulcer, No history of DVT   Pulmonary: No home oxygen, no productive cough, no hemoptysis,  No asthma or wheezing  Musculoskeletal:  _0  Arthritis, _1  Low back pain,  _2  Joint pain  Hematologic:No history of hypercoagulable state.  No history of easy bleeding.  No history of anemia  Gastrointestinal: No hematochezia or melena,  No gastroesophageal reflux, no trouble swallowing  Urinary: _3  chronic Kidney disease, _4  on HD - _5  MWF or _6  TTHS, _7  Burning with urination, _8  Frequent urination, _9  Difficulty urinating;   Skin: No rashes  Psychological: No history of anxiety,  No history of depression   Physical Examination  Vitals:   07/30/19 1340  BP: 122/61  Pulse: 71  Resp: 20  Temp: 98.1 F (36.7 C)  SpO2: 100%  Weight: 113 lb 6.4 oz (51.4 kg)  Height: _10  (1.6 m)    General:  Alert and oriented, no acute distress HEENT: Normal Neck: No JVD Cardiac: Regular Rate and Rhythm Skin: No rash Extremity Pulses:  2+ radial, brachial pulses bilaterally Musculoskeletal: No deformity or edema  Neurologic: Upper and lower extremity motor 5/5 and symmetric  DATA:  Patient had a vein mapping ultrasound today which shows the right cephalic vein is 2.5 mm in diameter.  The basilic vein was 3 to 4 mm.  On the left side the cephalic vein was 3 to 4 mm in the basilic vein was 3 to 4 mm.  Arterial duplex showed small radial arteries with otherwise normal anatomy   ASSESSMENT: Patient needs long-term hemodialysis access.  She prefers to  defer placement of her access until further work-up has been completed on her right breast mass.  She would probably be a candidate for a left radial versus brachiocephalic AV fistula depending on the quality of the artery at the time of operation.  Risk benefits possible complications of procedure details including not limited to bleeding infection ischemic steal nonmaturation of the fistula were discussed with patient and her husband today.  She understands and agrees to proceed but wishes to defer scheduling for now.  She will call us back if she wishes to schedule in the near future.   PLAN: Left radial versus brachiocephalic AV fistula patient will call to schedule   Ruta Hinds, MD [facility] Office: [phone] Pager: [phone]

## 2019-08-03 ENCOUNTER — Other Ambulatory Visit: Payer: Self-pay

## 2019-08-03 ENCOUNTER — Telehealth: Payer: Self-pay | Admitting: *Deleted

## 2019-08-03 ENCOUNTER — Telehealth: Payer: Self-pay | Admitting: Cardiology

## 2019-08-03 DIAGNOSIS — R040 Epistaxis: Secondary | ICD-10-CM

## 2019-08-03 DIAGNOSIS — G4733 Obstructive sleep apnea (adult) (pediatric): Secondary | ICD-10-CM

## 2019-08-03 NOTE — Telephone Encounter (Signed)
Patient has a 10 week follow up appointment scheduled for 09/10/19. Patient understands she needs to keep this appointment for insurance compliance. Patient was grateful for the call and thanked me.

## 2019-08-03 NOTE — Telephone Encounter (Signed)
Patient states that she switched from a c-pap to a bi-pap. She states that she picked it up Friday and the lady at [facility] where she picked up the machine stated that the patient needed to speak with Dr. Radford Pax in regards to some issues she was having. She is asking for Dr. Radford Pax or her nurse to please give her call back.

## 2019-08-03 NOTE — Telephone Encounter (Signed)
Referral sent to [facility], RN to get patient in to see ENT  Dr Wilburn Cornelia or Dr Redmond Baseman for nose bleeds and ear popping. Patient is encouraged to call her DME about water running out of her machine. Gave patient suggestions made by Dr. Radford Pax of recommend nasal saline spray 2 sprays twice daily in the nose. Can use saline gel if needed. Understanding was verbalized.

## 2019-08-03 NOTE — Telephone Encounter (Signed)
-----   Message from Sueanne Margarita, MD sent at 08/03/2019  1:02 PM EST ----- Regarding: RE: New Bipap Problems Please get in to see Dr. Wilburn Cornelia or Dr. Redmond Baseman with ENT ASAP for nose bleeds and ear popping.  Suspect she may have eustachian tube dysfunction causing the ear popping.  Have her call DME about water running out.  Does she have a full face mask?   Recommend nasal saline spray 2 sprays twice daily in the nose. Can use saline gel if needed  Traci ----- Message ----- From: Freada Bergeron, CMA Sent: 08/03/2019  12:39 PM EST To: Sueanne Margarita, MD Subject: New Bipap Problems                             Patient just got her Bipap machine on January 8th. Patients reports ears popping when her unit ramps up. New problem with nosebleeds constantly but not profusely. Machine uses all the the water by 4 am she goes to bed around 11 pm. Husband reports has started snoring with the mask on. Lips are stuck to her teeth in the  morning and bad breathe even after using biotene.  Patient wants to know if it is ok to use a saline gel called AYR for raw nasal passages.  Please advise

## 2019-08-06 ENCOUNTER — Other Ambulatory Visit (HOSPITAL_COMMUNITY): Payer: Self-pay | Admitting: Cardiology

## 2019-08-06 DIAGNOSIS — I6523 Occlusion and stenosis of bilateral carotid arteries: Secondary | ICD-10-CM

## 2019-08-10 ENCOUNTER — Other Ambulatory Visit: Payer: Self-pay

## 2019-08-10 ENCOUNTER — Inpatient Hospital Stay (HOSPITAL_COMMUNITY): Payer: Medicare PPO | Attending: Hematology

## 2019-08-10 DIAGNOSIS — D696 Thrombocytopenia, unspecified: Secondary | ICD-10-CM | POA: Diagnosis not present

## 2019-08-10 DIAGNOSIS — Z86000 Personal history of in-situ neoplasm of breast: Secondary | ICD-10-CM | POA: Insufficient documentation

## 2019-08-10 DIAGNOSIS — D500 Iron deficiency anemia secondary to blood loss (chronic): Secondary | ICD-10-CM

## 2019-08-10 DIAGNOSIS — Z9012 Acquired absence of left breast and nipple: Secondary | ICD-10-CM | POA: Insufficient documentation

## 2019-08-10 DIAGNOSIS — N183 Chronic kidney disease, stage 3 unspecified: Secondary | ICD-10-CM

## 2019-08-10 DIAGNOSIS — C8190 Hodgkin lymphoma, unspecified, unspecified site: Secondary | ICD-10-CM | POA: Diagnosis not present

## 2019-08-10 LAB — CBC WITH DIFFERENTIAL/PLATELET
Abs Immature Granulocytes: 0.03 10*3/uL (ref 0.00–0.07)
Basophils Absolute: 0.1 10*3/uL (ref 0.0–0.1)
Basophils Relative: 1 %
Eosinophils Absolute: 0.1 10*3/uL (ref 0.0–0.5)
Eosinophils Relative: 1 %
HCT: 37.3 % (ref 36.0–46.0)
Hemoglobin: 11.7 g/dL — ABNORMAL LOW (ref 12.0–15.0)
Immature Granulocytes: 0 %
Lymphocytes Relative: 14 %
Lymphs Abs: 1.1 10*3/uL (ref 0.7–4.0)
MCH: 29.8 pg (ref 26.0–34.0)
MCHC: 31.4 g/dL (ref 30.0–36.0)
MCV: 95.2 fL (ref 80.0–100.0)
Monocytes Absolute: 0.6 10*3/uL (ref 0.1–1.0)
Monocytes Relative: 7 %
Neutro Abs: 6.3 10*3/uL (ref 1.7–7.7)
Neutrophils Relative %: 77 %
Platelets: 121 10*3/uL — ABNORMAL LOW (ref 150–400)
RBC: 3.92 MIL/uL (ref 3.87–5.11)
RDW: 14.1 % (ref 11.5–15.5)
WBC: 8.2 10*3/uL (ref 4.0–10.5)
nRBC: 0 % (ref 0.0–0.2)

## 2019-08-10 LAB — LACTATE DEHYDROGENASE: LDH: 135 U/L (ref 98–192)

## 2019-08-10 LAB — COMPREHENSIVE METABOLIC PANEL
ALT: 24 U/L (ref 0–44)
AST: 27 U/L (ref 15–41)
Albumin: 4.6 g/dL (ref 3.5–5.0)
Alkaline Phosphatase: 157 U/L — ABNORMAL HIGH (ref 38–126)
Anion gap: 11 (ref 5–15)
BUN: 58 mg/dL — ABNORMAL HIGH (ref 8–23)
CO2: 27 mmol/L (ref 22–32)
Calcium: 10.5 mg/dL — ABNORMAL HIGH (ref 8.9–10.3)
Chloride: 102 mmol/L (ref 98–111)
Creatinine, Ser: 1.86 mg/dL — ABNORMAL HIGH (ref 0.44–1.00)
GFR calc Af Amer: 32 mL/min — ABNORMAL LOW (ref >60)
GFR calc non Af Amer: 28 mL/min — ABNORMAL LOW (ref >60)
Glucose, Bld: 89 mg/dL (ref 70–99)
Potassium: 4.1 mmol/L (ref 3.5–5.1)
Sodium: 140 mmol/L (ref 135–145)
Total Bilirubin: 0.6 mg/dL (ref 0.3–1.2)
Total Protein: 8.3 g/dL — ABNORMAL HIGH (ref 6.5–8.1)

## 2019-08-14 DIAGNOSIS — J343 Hypertrophy of nasal turbinates: Secondary | ICD-10-CM | POA: Insufficient documentation

## 2019-08-14 DIAGNOSIS — J342 Deviated nasal septum: Secondary | ICD-10-CM | POA: Insufficient documentation

## 2019-08-14 DIAGNOSIS — R040 Epistaxis: Secondary | ICD-10-CM | POA: Insufficient documentation

## 2019-08-17 ENCOUNTER — Encounter (HOSPITAL_COMMUNITY): Payer: Self-pay | Admitting: Hematology

## 2019-08-17 ENCOUNTER — Inpatient Hospital Stay (HOSPITAL_BASED_OUTPATIENT_CLINIC_OR_DEPARTMENT_OTHER): Payer: Medicare PPO | Admitting: Hematology

## 2019-08-17 ENCOUNTER — Other Ambulatory Visit: Payer: Self-pay

## 2019-08-17 DIAGNOSIS — D649 Anemia, unspecified: Secondary | ICD-10-CM

## 2019-08-17 DIAGNOSIS — D696 Thrombocytopenia, unspecified: Secondary | ICD-10-CM | POA: Diagnosis not present

## 2019-08-17 NOTE — Progress Notes (Signed)
Virtual Visit via Telephone Note  I connected with [name] [name] on 08/17/19 at  2:20 PM EST by telephone and verified that I am speaking with the correct person using two identifiers.   I discussed the limitations, risks, security and privacy concerns of performing an evaluation and management service by telephone and the availability of in person appointments. I also discussed with the patient that there may be a patient responsible charge related to this service. The patient expressed understanding and agreed to proceed.   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   Observations/Objective: She denies any fevers, night sweats or weight loss in the last 3 to 4 months.  Appetite is 75%.  Energy levels are 50%.  Shortness of breath on exertion is stable.  Ankle swellings is also stable.  She had mammogram followed by right breast ultrasound on 08/04/2019.  Assessment and Plan:  1.  Right breast mass: -Mammogram and ultrasound on 08/04/2019 showed 0.6 cm oval mass of mixed echogenicity with a circumscribed margin in the right breast 9 o'clock position.  This is soft on elastography with peripheral blood flow.  2 adjacent benign 0.5 and 0.3 cm round cyst in the right breast at 9 o'clock position, consistent with cysts. -She is reportedly undergoing a repeat ultrasound on 08/25/2019.  If the complex cystic lesion is still positive, they will attempt aspiration and/or biopsy.  2.  Mild hypercalcemia: -Normal intact PTH and vitamin D.  Myeloma panel is negative. -The latest labs show calcium level of 10.5.  Albumin is 4.6.  3.  Mild thrombocytopenia: -CT of the abdomen did not show splenomegaly.  Previous connective tissue disorder work-up was negative.  Peripheral blood smear showed giant platelets.  Clinically consistent with immune mediated  thrombocytopenia. -Platelet count today is 121.  No active bleeding or bruising noted.  No active intervention necessary.  4.  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  She is not on tamoxifen.    Follow Up Instructions: RTC 3 months with labs.   I discussed the assessment and treatment plan with the patient. The patient was provided an opportunity to ask questions and all were answered. The patient agreed with the plan and demonstrated an understanding of the instructions.   The patient was advised to call back or seek an in-person evaluation if the symptoms worsen or if the condition fails to improve as anticipated.  I provided 11 minutes of non-face-to-face time during this encounter.   Derek Jack, MD

## 2019-08-20 ENCOUNTER — Other Ambulatory Visit (HOSPITAL_COMMUNITY): Payer: Self-pay | Admitting: Cardiology

## 2019-08-20 DIAGNOSIS — I6523 Occlusion and stenosis of bilateral carotid arteries: Secondary | ICD-10-CM

## 2019-08-24 ENCOUNTER — Encounter: Payer: Self-pay | Admitting: Hematology

## 2019-08-24 ENCOUNTER — Telehealth (HOSPITAL_COMMUNITY): Payer: Self-pay

## 2019-08-24 NOTE — Telephone Encounter (Signed)

## 2019-08-25 ENCOUNTER — Other Ambulatory Visit: Payer: Self-pay

## 2019-08-25 ENCOUNTER — Telehealth: Payer: Self-pay | Admitting: Pharmacist

## 2019-08-25 ENCOUNTER — Ambulatory Visit (HOSPITAL_COMMUNITY)
Admission: RE | Admit: 2019-08-25 | Discharge: 2019-08-25 | Disposition: A | Discharge status: Home / Self Care | Payer: Medicare PPO | Source: Ambulatory Visit | Attending: Cardiology | Admitting: Cardiology

## 2019-08-25 VITALS — HR 69 | Wt 114.0 lb

## 2019-08-25 DIAGNOSIS — Z7982 Long term (current) use of aspirin: Secondary | ICD-10-CM | POA: Insufficient documentation

## 2019-08-25 DIAGNOSIS — I5032 Chronic diastolic (congestive) heart failure: Secondary | ICD-10-CM | POA: Diagnosis present

## 2019-08-25 DIAGNOSIS — Z950 Presence of cardiac pacemaker: Secondary | ICD-10-CM | POA: Insufficient documentation

## 2019-08-25 DIAGNOSIS — I25119 Atherosclerotic heart disease of native coronary artery with unspecified angina pectoris: Secondary | ICD-10-CM | POA: Insufficient documentation

## 2019-08-25 DIAGNOSIS — E1022 Type 1 diabetes mellitus with diabetic chronic kidney disease: Secondary | ICD-10-CM | POA: Diagnosis not present

## 2019-08-25 DIAGNOSIS — G4733 Obstructive sleep apnea (adult) (pediatric): Secondary | ICD-10-CM | POA: Diagnosis not present

## 2019-08-25 DIAGNOSIS — R0602 Shortness of breath: Secondary | ICD-10-CM | POA: Insufficient documentation

## 2019-08-25 DIAGNOSIS — M109 Gout, unspecified: Secondary | ICD-10-CM | POA: Diagnosis not present

## 2019-08-25 DIAGNOSIS — I081 Rheumatic disorders of both mitral and tricuspid valves: Secondary | ICD-10-CM | POA: Insufficient documentation

## 2019-08-25 DIAGNOSIS — D509 Iron deficiency anemia, unspecified: Secondary | ICD-10-CM | POA: Insufficient documentation

## 2019-08-25 DIAGNOSIS — Z951 Presence of aortocoronary bypass graft: Secondary | ICD-10-CM | POA: Insufficient documentation

## 2019-08-25 DIAGNOSIS — D696 Thrombocytopenia, unspecified: Secondary | ICD-10-CM | POA: Diagnosis not present

## 2019-08-25 DIAGNOSIS — Z8249 Family history of ischemic heart disease and other diseases of the circulatory system: Secondary | ICD-10-CM | POA: Insufficient documentation

## 2019-08-25 DIAGNOSIS — I480 Paroxysmal atrial fibrillation: Secondary | ICD-10-CM

## 2019-08-25 DIAGNOSIS — I425 Other restrictive cardiomyopathy: Secondary | ICD-10-CM | POA: Insufficient documentation

## 2019-08-25 DIAGNOSIS — I2510 Atherosclerotic heart disease of native coronary artery without angina pectoris: Secondary | ICD-10-CM

## 2019-08-25 DIAGNOSIS — E1043 Type 1 diabetes mellitus with diabetic autonomic (poly)neuropathy: Secondary | ICD-10-CM | POA: Insufficient documentation

## 2019-08-25 DIAGNOSIS — M791 Myalgia, unspecified site: Secondary | ICD-10-CM | POA: Diagnosis not present

## 2019-08-25 DIAGNOSIS — Z9641 Presence of insulin pump (external) (internal): Secondary | ICD-10-CM | POA: Diagnosis not present

## 2019-08-25 DIAGNOSIS — E785 Hyperlipidemia, unspecified: Secondary | ICD-10-CM | POA: Insufficient documentation

## 2019-08-25 DIAGNOSIS — N184 Chronic kidney disease, stage 4 (severe): Secondary | ICD-10-CM | POA: Diagnosis not present

## 2019-08-25 DIAGNOSIS — I495 Sick sinus syndrome: Secondary | ICD-10-CM | POA: Diagnosis not present

## 2019-08-25 DIAGNOSIS — I130 Hypertensive heart and chronic kidney disease with heart failure and stage 1 through stage 4 chronic kidney disease, or unspecified chronic kidney disease: Secondary | ICD-10-CM | POA: Insufficient documentation

## 2019-08-25 DIAGNOSIS — Z79899 Other long term (current) drug therapy: Secondary | ICD-10-CM | POA: Insufficient documentation

## 2019-08-25 DIAGNOSIS — Z794 Long term (current) use of insulin: Secondary | ICD-10-CM | POA: Insufficient documentation

## 2019-08-25 DIAGNOSIS — K760 Fatty (change of) liver, not elsewhere classified: Secondary | ICD-10-CM | POA: Insufficient documentation

## 2019-08-25 DIAGNOSIS — R0989 Other specified symptoms and signs involving the circulatory and respiratory systems: Secondary | ICD-10-CM | POA: Insufficient documentation

## 2019-08-25 DIAGNOSIS — Z953 Presence of xenogenic heart valve: Secondary | ICD-10-CM | POA: Insufficient documentation

## 2019-08-25 DIAGNOSIS — R0609 Other forms of dyspnea: Secondary | ICD-10-CM | POA: Insufficient documentation

## 2019-08-25 LAB — BASIC METABOLIC PANEL
Anion gap: 12 (ref 5–15)
BUN: 47 mg/dL — ABNORMAL HIGH (ref 8–23)
CO2: 28 mmol/L (ref 22–32)
Calcium: 10.3 mg/dL (ref 8.9–10.3)
Chloride: 101 mmol/L (ref 98–111)
Creatinine, Ser: 1.65 mg/dL — ABNORMAL HIGH (ref 0.44–1.00)
GFR calc Af Amer: 37 mL/min — ABNORMAL LOW (ref >60)
GFR calc non Af Amer: 32 mL/min — ABNORMAL LOW (ref >60)
Glucose, Bld: 149 mg/dL — ABNORMAL HIGH (ref 70–99)
Potassium: 4 mmol/L (ref 3.5–5.1)
Sodium: 141 mmol/L (ref 135–145)

## 2019-08-25 MED ORDER — TORSEMIDE 20 MG PO TABS: ORAL_TABLET | ORAL | 1 refills | Status: DC

## 2019-08-25 MED ORDER — REPATHA SURECLICK 140 MG/ML ~~LOC~~ SOAJ: 140.0000 mg | SUBCUTANEOUS | 11 refills | Status: DC

## 2019-08-25 NOTE — Telephone Encounter (Signed)
-----   Message from Valeda Malm, RN sent at 08/25/2019 10:46 AM EST ----- Regarding: change of medication [name]  Pt was seen in our office this morning.  Pt reports to [name] that she needs to switch from praluent to Repatha due to change in insurance. Can you please up with patient about this?   Ezekiel Ina, RN

## 2019-08-25 NOTE — Progress Notes (Signed)
Date:  08/25/2019   ID:  [name], [name] 1951/07/12, MRN [id number]   Provider location: [address] [place] Type of Visit: Established patient  PCP:  [name], MD  Cardiologist:  No primary care provider on file. Primary HF: Dr. Aundra Dubin   History of Present Illness: [name] [name] is a 69 y.o. female who has a history of CAD s/p CABG in 12/05 and redo in 1/11, bioprosthetic AVR, diastolic CHF, and sick sinus syndrome s/p PCM presents for cardiology followup.  She had initial CABG with SVG-RCA in 12/05 followed by redo SVG-RCA and bioprosthetic AVR in 1/11.  She has had diastolic CHF and is on Lasix.  In 9/14, she had a CPX test.  This showed severe functional limitation with ischemic ECG changes (inferolateral ST depression and ST elevation in V1 and V2). She had chest pain.  She had R/LHC in 9/14.  This showed elevated left and right heart filling pressures, patent SVG-PDA, and 80% distal LM/80% ostial LAD stenosis.  She was not a candidate for redo CABG (had 2 prior sternotomies as well as chest radiation for Hodgkins lymphoma).  She had DES from left main into proximal LAD and will need long-term DAPT => Brilinta was used as she has been a poor responder to Plavix in the past.  She re-developed angina in 11/15 and was admitted with unstable angina.  LHC showed 80% ostial LM and patent SVG-RCA. She had DES to ostial LM. Echo in 1/18 showed EF 60-65%, normal bioprosthetic aortic valve, mild-moderate MR, PASP 35 mmHg.   She has chronic iron deficiency anemia thought to be due to a slow GI bleed.  Last EGD was unremarkable and a capsule endoscopy was also unremarkable.  She has had periodic transfusions.    She had bilateral mastectomy for DCIS in 86/76 without complication.  She has an insulin pump followed by an endocrinologist at [facility].   Given myalgias with statins, she was started on Praluent.  LDL is now excellent.    In the past she has had short runs  of atrial fibrillation.  She is not anticoagulated. Planned for Watchman placement, but given her nickel allergy, we were unable to place a Watchman.     Diagnosed with OSA, now on CPAP.   Given ongoing problems with exertional dyspnea and occasional chest pain, Cardiolite was repeated in 9/18 and showed inferior changes.  She underwent left/right heart cath in 9/18.  This showed elevated filling pressures, R>L.  There was 75+% ostial LCx stenosis.  However, there were 2 layers of stent from the mid left main into the ostial LAD, and LCx intervention was thought to be very risky.  It was decided to manage her medically.  Echo was repeated in 10/18 showing EF 55-60%, moderate to severe TR.    Given recurrent chest pain, she had ETT-Cardiolite done in 3/19.  This was a normal study with no evidence for ischemia or infarction.  Echo in 1/20 showed EF 55-60%, mild LVH, stable bioprosthetic aortic valve, moderate-severe TR.    Recently, she has been more short of breath with exertion and renal function has been worse.  She was set up for echo and RHC.  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  RHC in 8/20 showed primarily  right-sided HF.  Cardiolite in 8/20 showed no ischemia or infarction.  Torsemide was increased and she cut back on sodium in her diet.    CPX in 11/20 showed severe functional limitation from HF.   She saw Dr. Mosetta Pigeon at [facility] for evaluation for heart/kidney transplant (for restrictive cardiomyopathy).  She was determined to be a poor surgical candidate due to extensive vascular calcifications.   She returns today for followup of CHF and CAD.  Weight is up 2 lbs.  Creatinine recently has been a bit improved, most recently 1.86.  Symptoms wax and wane, some  days she is short of breath walking up stairs but not on others.  She generally does ok walking on flat ground. She tries to walk on her treadmill for 30-40 minutes/day. Peripheral edema waxes and wanes with sodium in diet.  No orthopnea/PND.  No chest pain.  No orthopnea/PND.      ECG (personally reviewed): NSR, 1st degree AVB, LVH  Labs (2/19): hgb 13.2 Labs @ [facility] (3/19): K 4.7, creatinine 1.4, ANA negative, RF negative Labs (4/19): K 3.9, creatinine 1.52 => 1.63 Labs (6/19): LDL 8, HDL 42, TGs 259 Labs (9/19): K 4.9, creatinine 1.65, hgb 13.4, plts 129, LDL 46,TGs 182 Labs (12/19): K 4.9, creatinine 2.07, hgb 12.4 Labs (3/20): K 4, creatinine 1.8 Labs (6/20): K 4.7, creatinine 1.47 Labs (8/20): creatinine 2.13 => 2.46 => 2.4, LDL 22 Labs (9/20): K 4.8, creatinine 2.74, AST/ALT normal, hgb 12.6 Labs (1/21): K 4.1, creatinine 1.86, hgb 11.7  PMH: 1. Sick sinus syndrome s/p [employer] PCM.  2. HTN 3. H/o SBO 4. Renal cell carcinoma s/p right nephrectomy in 2008.  5. 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  Fibroscan in 11/14 showed advanced fibrosis concerning for cirrhosis. EGD in 11/14 showed no varices.  6. Diastolic CHF: Echo (1/06) with EF 60-65%, bioprosthetic aortic valve with mean gradient 15 mmHg, mild MR.  CPX (9/14): peak VO2 10.4, VE/VCO2 43.8, inferolateral ST depression and ST elevation in V1/V2 with severe dyspnea and chest pain, severe functional limitation with ischemic changes.  Echo (9/14) with EF 60-65%, normal RV size and systolic function, mild-moderate MR, bioprosthetic aortic valve normal, PA systolic pressure 51 mmHg, no evidence for pericardial constriction though exam incomplete for constriction.  Echo (11/15) with EF 55-60%, bioprosthetic aortic valve, mild-moderate MR, moderate TR, PA systolic pressure 42 mmHg.  - TEE (5/17) with EF 60-65%, normal bioprosthetic aortic valve with mean gradient 13 mmHg, normal RV size and systolic function. - Echo (1/18) with EF  60-65%, normal bioprosthetic aortic valve, mild-moderate MR, PASP 35 mmHg.  - Echo (10/18) with EF 55-60%, bioprosthetic aortic valve mean gradient 10 mmHg, MAC with mild mitral stenosis (mean gradient 7) and mild mitral regurgitation, moderate to severe TR, PASP 43 mmHg.  - RHC (9/18): mean RA 13, PA 44/18 mean 33, mean PCWP 19, CI 3.45, PVR 2.7 WU.  - Echo (1/20): EF 55-60%, mild LVH, mild MR, stable bioprosthetic aortic valve, moderate-severe TR.  - RHC (8/20): mean RA 14, PA 43/15 mean 28, mean PCWP 13, PAPI 2, PVR 4.2, CI 2.31 - Echo (8/20): EF 55-60%, RV mildly dilated with normal systolic function, mildly D-shaped septum suggestive of RV pressure/volume overload, rheumatic-appearing mitral valve with mild mitral stenosis (mean gradient 6, MVA by PHT 4 cm^2), no evidence for pericardial constriction, moderate TR, bioprosthetic aortic valve functioning normally with mean gradient 6 mmHg.  - CPX (11/20): peak VO2 9.8, VE/VCO2 slope 38, RER 1.11 => severe functional limitation  from HF.   7. TAH-BSO 8. Type I diabetes: Has insulin pump.  9. H/o ischemic colitis. 10. H/o PUD. 11. Diabetic gastroparesis. 12. Hyperlipidemia: Myalgias with Crestor and atorvastatin, elevated LFTs with simvastatin.  Myalgias with > 10 mg pravastatin. Now on Praluent.  13. CKD stage IV: Sees Dr. Lorrene Reid 14. Bicuspid aortic valve s/p 19 mm Edwards pericardial valve in 1/11 (had post-operative Dresslers syndrome).   15. CAD: CABG with SVG-RCA in 12/05.  Redo SVG-RCA in 1/11 with AVR.  LHC/RHC (9/14) with mean RA 18, PA 62/25 mean 43, mean PCWP 26, CI 2.8, 70-80% distal LM stenosis, 80% ostial LAD stenosis, total occlusion RCA, SVG-PDA patent.  Patient had DES LM into LAD.  Plan to continue long-term ASA/Brilinta (poor responder to Plavix).  ETT-cardiolite (4/15) with 6' exercise, EF 60%, ST depression in recovery and mild chest pain, no ischemia or infarction by perfusion images.  Unstable angina 11/15 with 80% LM, patent  SVG-RCA => DES to ostial LM.   - Lexiscan Cardiolite (2/18): EF 62%, no ischemia or infarction (normal).  She had a significant reaction to [place] and probably should not get again.  - Cardiolite (9/18): EF 72%, prior inferior infarct with mild peri-infarct ischemia.  - LHC (9/18): 2 layers stent left main - ostial LAD with 50% dLM in-stent restenosis, 75+% ostial LCx, totally occluded RCA, SVG-RCA patent.  Medical management planned.  - ETT-Cardiolite (4/19): EF normal, 7'11" exercise, no ischemia or infarction.  - Lexiscan Cardiolite (8/20): No ischemia/infarction.  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  17. Anemia: Iron-deficiency.  Suspect chronic GI blood loss.  EGD in 9/14 was unremarkable.  Capsule endoscopy 10/14 was unremarkable.  Has required periodic blood transfusions.  18. Atrial fibrillation: Paroxysmal, noted by [facility] interrogation.  19. Carotid stenosis: Carotid dopplers (2/15) with 40-59% bilateral stenosis. Carotid dopplers (3/16) with 40-59% bilateral ICA stenosis. Carotid dopplers (3/17) with 40-59% bilateral ICA stenosis.  - Carotid dopplers (3/18) with mild BICA stenosis.  20. 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  22. C difficile colitis 2017 23. Gout 24. ?TIA: head CT negative.  25. OSA: Moderate, uses CPAP.  26. Chronic thrombocytopenia 27. Hypercalcemia 28. 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  29. Tricuspid regurgitation: Moderate by 8/20 echo.   Current Outpatient Medications  Medication Sig Dispense Refill  . acyclovir ointment (ZOVIRAX) 5 % Apply 1 application topically every 3 (three) hours as needed (fever blister/cold sores).    . ALPRAZolam (XANAX) 0.5 MG tablet Take 1 tablet (0.5 mg total) by mouth 2 (two) times daily as needed for anxiety. (Patient taking differently: Take  0.25-0.5 mg by mouth See admin instructions. Take 0.25 mg at bedtime, may take a additional 0.25-0.5 mg dose during the day as needed for anxiety) 60 tablet 1  . amLODipine (NORVASC) 2.5 MG tablet TAKE ONE (1) TABLET BY MOUTH EVERY DAY 90 tablet 1  . APIDRA 100 UNIT/ML injection Inject 40 Units into the skin as directed. INFUSE THROUGH INSULIN PUMP UTD, Bolus depends on carb intake  2  . aspirin EC 81 MG tablet Take 81 mg by mouth at bedtime.    Marland Kitchen BRILINTA 60 MG TABS tablet TAKE ONE TABLET (60MG TOTAL) BY MOUTH TWICE DAILY 180 tablet 3  . clobetasol (OLUX) 0.05 % topical foam Apply 1 application topically 2 (two) times daily as needed (skin flaking).    Marland Kitchen  Coenzyme Q10 (CO Q 10) 100 MG CAPS Take 100 mg by mouth at bedtime.     . colchicine 0.6 MG tablet Take 0.6 mg by mouth daily as needed (gout flare).    . CONTOUR NEXT TEST test strip USE TO TEST BLOOD SUGAR UP TO EIGHT TIMES A DAY    . docusate sodium (COLACE) 100 MG capsule Take 100 mg by mouth 2 (two) times daily.     Marland Kitchen erythromycin with ethanol (THERAMYCIN) 2 % external solution Apply topically as needed.     . famotidine (PEPCID) 40 MG tablet Take 40 mg by mouth daily.    . Fe Fum-FA-B Cmp-C-Zn-Mg-Mn-Cu (HEMATINIC PLUS VIT/MINERALS) 106-1 MG TABS TAKE ONE TABLET BY MOUTH THREE TIMES A WEEK (MONDAY, WEDNESDAY, AND FRIDAY) OR AS DIRECTED WITH A VITAMIN C 90 tablet 0  . glucagon (GLUCAGON EMERGENCY) 1 MG injection Inject 1 mg into the vein once as needed (low blood sugar).     . isosorbide mononitrate (IMDUR) 120 MG 24 hr tablet TAKE ONE TABLET (120MG TOTAL) BY MOUTH DAILY (Patient taking differently: Take 120 mg by mouth at bedtime. ) 90 tablet 3  . latanoprost (XALATAN) 0.005 % ophthalmic solution Place 1 drop into both eyes at bedtime.    Marland Kitchen loratadine (CLARITIN) 10 MG tablet Take 10 mg by mouth daily as needed for allergies.    . metolazone (ZAROXOLYN) 2.5 MG tablet Take 2.5 mg (1 tab) once as directed by CHF clinic. Call before taking  [phone] 3 tablet 0  . metoprolol tartrate (LOPRESSOR) 50 MG tablet TAKE ONE AND ONE-HALF TABLETS (75MG TOTAL) BY MOUTH TWICE DAILY 270 tablet 2  . mupirocin cream (BACTROBAN) 2 % Apply 1 application topically 2 (two) times daily as needed. 90 g 0  . nitroGLYCERIN (NITRODUR - DOSED IN MG/24 HR) 0.2 mg/hr patch Apply 1/4 patch to area daily prn 30 patch 12  . nitroGLYCERIN (NITROLINGUAL) 0.4 MG/SPRAY spray PLACE 1 SPRAY UNDER THE TONGUE EVERY 5 MINUTES FOR 3 DOSES AS NEEDED FOR CHEST PAIN. 14.7 g 0  . Polyethyl Glycol-Propyl Glycol (SYSTANE) 0.4-0.3 % SOLN Apply 1 drop to eye daily as needed (dry eyes).    . pravastatin (PRAVACHOL) 10 MG tablet TAKE ONE TABLET (10MG TOTAL) BY MOUTH DAILY 90 tablet 3  . PRESCRIPTION MEDICATION Apply 1 application topically 2 (two) times daily. Erythromycin solution    . silver nitrate applicators 99-83 % applicator Use as needed to stop superficial skin bleeding. 10 each 0  . spironolactone (ALDACTONE) 25 MG tablet TAKE ONE TABLET (25MG TOTAL) BY MOUTH DAILY (Patient taking differently: Take 12.5 mg by mouth daily. ) 90 tablet 1  . torsemide (DEMADEX) 20 MG tablet Take 2 tablets (40 mg total) by mouth every morning AND 1 tablet (20 mg total) every evening. Take extra if needed for fluid . 270 tablet 1  . ULORIC 40 MG tablet Take 1 tablet (40 mg total) by mouth at bedtime. 90 tablet 3  . UNABLE TO FIND Med Name: So Clean CPAP cleaning system 1 each 0  . VASCEPA 1 g CAPS TAKE 2 CAPSULES (2 GRAMS TOTAL) BY MOUTHTWO TIMES DAILY (Patient taking differently: Take 2 g by mouth 2 (two) times daily. ) 360 capsule 3  . Wheat Dextrin (BENEFIBER) POWD Take 1 Dose by mouth daily.    . Evolocumab (REPATHA SURECLICK) 382 MG/ML SOAJ Inject 140 mg into the skin every 14 (fourteen) days. 2 pen 11   No current facility-administered medications for this encounter.  Allergies:   Avandia [rosiglitazone maleate], Cephalexin, Clindamycin hcl, Humalog [insulin lispro], Lincomycin,  Metformin, Novolog [insulin aspart], Penicillins, Rosiglitazone, Tizanidine, Adhesive [tape], Atorvastatin, Cholestyramine, Gentamycin [gentamicin], Iodinated diagnostic agents, Limonene, Pravastatin, Prednisone, Ranexa [ranolazine], Rosuvastatin, Simvastatin, Strawberry extract, Clopidogrel, Codeine, Erythromycin, Hydrocodone-acetaminophen, Latex, Neomycin, Nickel, Ranitidine, Tamiflu [oseltamivir], and Tramadol   Social History:  The patient  reports that she has never smoked. She has never used smokeless tobacco. She reports that she does not drink alcohol or use drugs.   Family History:  The patient's family history includes Breast cancer (age of onset: 38) in her sister; Cancer (age of onset: 87) in her sister; Cancer (age of onset: 104) in her maternal grandfather; Diabetes in her father; Glaucoma in her brother; Heart attack in her father, maternal grandmother, and mother; Heart disease in her father and mother; Hypertension in her brother; Lung cancer in her sister.   ROS:  Please see the history of present illness.   All other systems are personally reviewed and negative.   Exam:   Pulse 69   Wt 51.7 kg (114 lb)   SpO2 100%   BMI 20.19 kg/m  General: NAD Neck: JVP 8-9 cm, no thyromegaly or thyroid nodule.  Lungs: Clear to auscultation bilaterally with normal respiratory effort. CV: Nondisplaced PMI.  Heart regular S1/S2, no S3/S4, 2/6 HSM LLSB.  No peripheral edema.  No carotid bruit.  Normal pedal pulses.  Abdomen: Soft, nontender, no hepatosplenomegaly, mild abdominal distention.  Skin: Intact without lesions or rashes.  Neurologic: Alert and oriented x 3.  Psych: Normal affect. Extremities: No clubbing or cyanosis.  HEENT: Normal.   Recent Labs: 04/08/2019: B Natriuretic Peptide 111.8 04/13/2019: TSH 2.141 08/10/2019: ALT 24; Hemoglobin 11.7; Platelets 121 08/25/2019: BUN 47; Creatinine, Ser 1.65; Potassium 4.0; Sodium 141  Personally reviewed   Wt Readings from Last 3  Encounters:  08/25/19 51.7 kg (114 lb)  07/30/19 51.4 kg (113 lb 6.4 oz)  07/10/19 48.1 kg (106 lb)    ASSESSMENT AND PLAN:  1. CAD: s/p SVG-RCA in 12/05, then redo SVG-RCA in 1/11-->distal left main/proximal LAD severe stenosis in 9/14. PCI with DES to distal LM/proximal LAD was done rather than bypass as she would have been a poor candidate for redo sternotomy (2 prior sternotomies and history of chest wall radiation for Hodgkins disease). Recurrent unstable angina with 80% ostial LM treated with repeat DES in 11/15.  Lexiscan Cardiolite in 9/18 inferior changes.  Repeat LHC (9/18) showed 50% distal left main in-stent restenosis and 75+% ostial LCx stenosis. I reviewed the films at the time with interventional cardiology => would be very difficult to intervene on the ostial LCx through 2 overlapping stents from left main into the LAD.  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  Given no ACS, we planned medical management and increased Imdur.  Cardiolite in 3/19 and again in 8/20 showed no ischemia or infarction.  No recent chest pain. - Continue Brilinta 60 mg bid (poor responder to plavix) long-term given recurrent left main disease.  - Continue ASA 81, statin/Praluent/Vascepa, metoprolol, and Imdur 120 mg daily.  - She was unable to tolerate ranolazine.  2. Bioprosthetic aortic valve replacement: In setting of bicuspid aortic valve, thoracic aorta not dilated on 2017 TEE. Valve looked ok on 8/20 echo.  3. Chronic diastolic CHF: Restrictive cardiomyopathy.  Most recent echo in 8/20 showed normal LV systolic function, mildly dilated RV with normal systolic function, mild MS, moderate TR, normal bioprosthetic AoV, no  evidence for pericardial constriction. RHC in 8/20 was concerning for right > left heart failure, she did appear to have equalization of diastolic pressures concerning for restrictive cardiomyopathy, possibly related to prior radiation. CPX in 11/20  showed severe HF limitation.  Balance of renal failure and RV failure has been difficult.  Creatinine actually somewhat better recently.  She is volume overloaded on exam today though not markedly. NYHA class II-III.   - Increase torsemide to 40 qam/20 qpm with BMET today and in 10 days.   - Continue spironolactone 12.5 mg daily.  - I had her evaluated at [facility] for heart/kidney transplant for CKD and restrictive cardiomyopathy, but extensive vascular calcification makes her a poor surgical candidate.  4. CKD: Patient has a single kidney s/p right nephrectomy and suspected diabetic nephropathy. CKD stage 4, slowly progressive.  Creatinine better recently, 1.86 was last check. I have been concerned for cardiorenal syndrome in the setting of RV > LV failure/restrictive cardiomyopathy.   - Not candidate for PD given prior abdominal surgery.  - If CKD progresses, she may be a candidate for home HD.  5. Hyperlipidemia: She has not been able to tolerate any higher potency statin than pravastatin 10 mg daily. Good lipids in 8/20.  - Continue pravastatin.  - Her insurance wants her to switch from Praluent to [place], will arrange.  - She is on Vascepa for elevated TGs.   6. Anemia: Chronic/slow GI bleed. Unremarkable EGD and capsule endoscopy in fall 2014. Unfortunately, needs to stay on Brilinta long-term (Plavix non-responder). Rectal bleeding earlier this year thought to be diverticular, has now resolved.  Has followup with GI.  Stable hgb recently.   7. Atrial fibrillation: Paroxysmal, brief episodes noted on PCM interrogation. Given GI bleeding and need to be on Brilinta, risks have been thought to outweigh benefits of anticoagulation.  She is not a good Multaq candidate with CHF. If she needs an antiarrhythmic, consider Tikosyn or Sotalol but renal function may make these meds difficult to use.  No recent palpitations.  - We were unable to place Watchman device due to nickel allergy.  8. Carotid  bruit: Only mild stenosis on last dopplers in 3/18.  9. SSS: Pacemaker in place.  10. HTN: controlled. Continue current regimen.  11. OSA: Moderate, using CPAP.  12. ?TIA: Transient dysarthria in 3/18, could have been afib-related TIA. No recurrent symptoms.  13. Thrombocytopenia: Chronic and mild. Follows with hematology.  14. 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  Based on 8/20 RHC and echo it appears mild.  15. Tricuspid regurgitation: Moderate on 8/20 echo.   Followup in 3 months.   Signed, [name], MD  08/25/2019  [facility] [address] [place] [address] [phone] (office) [phone] (fax)

## 2019-08-25 NOTE — Patient Instructions (Signed)
INCREASE Torsemide to 21m (2 tabs) in the morning and 2106m(1 tab) in the evening.   Labs today and repeat in 10 days We will only contact you if something comes back abnormal or we need to make some changes. Otherwise no news is good news!   A message was sent to the lipid clinic to change Praluent to [place] to your insurance. Please follow up with them.   Your physician recommends that you schedule a follow-up appointment in: 3 months with Dr McAundra Dubin   Please call office at [phone] if you have any questions or concerns.   At the [facility] and your health needs are our priority. As part of our continuing mission to provide you with exceptional heart care, we have created designated Provider Care Teams. These Care Teams include your primary Cardiologist (physician) and Advanced Practice Providers (APPs- Physician Assistants and Nurse Practitioners) who all work together to provide you with the care you need, when you need it.   You may see any of the following providers on your designated Care Team at your next follow up: . Marland Kitchenr DaGlori Bickers Dr DaLoralie Champagne AmDarrick GrinderNP . BrLyda JesterPA . LaAudry RilesPharmD   Please be sure to bring in all your medications bottles to every appointment.

## 2019-08-25 NOTE — Telephone Encounter (Signed)
Called and spoke w/pt regarding the approval of the repatha, rx sent, instructed the pt to call back if unaffordable, pt voiced understanding

## 2019-08-25 NOTE — Telephone Encounter (Signed)
New insurance w/[employer] is as follows: Id [id number] Bin [id number] Pcn [id number] Grp [id number]

## 2019-08-31 ENCOUNTER — Ambulatory Visit (INDEPENDENT_AMBULATORY_CARE_PROVIDER_SITE_OTHER): Payer: Medicare PPO | Admitting: *Deleted

## 2019-08-31 DIAGNOSIS — I480 Paroxysmal atrial fibrillation: Secondary | ICD-10-CM | POA: Diagnosis not present

## 2019-08-31 LAB — CUP PACEART REMOTE DEVICE CHECK
Battery Impedance: 406 Ohm
Battery Remaining Longevity: 119 mo
Battery Voltage: 2.8 V
Brady Statistic AP VP Percent: 0 %
Brady Statistic AP VS Percent: 5 %
Brady Statistic AS VP Percent: 0 %
Brady Statistic AS VS Percent: 94 %
Date Time Interrogation Session: 20210208103155
Implantable Lead Implant Date: 20140130
Implantable Lead Implant Date: 20140130
Implantable Lead Location: 753859
Implantable Lead Location: 753860
Implantable Lead Model: 5076
Implantable Lead Model: 5076
Implantable Pulse Generator Implant Date: 20140130
Lead Channel Impedance Value: 454 Ohm
Lead Channel Impedance Value: 575 Ohm
Lead Channel Pacing Threshold Amplitude: 0.375 V
Lead Channel Pacing Threshold Amplitude: 0.625 V
Lead Channel Pacing Threshold Pulse Width: 0.4 ms
Lead Channel Pacing Threshold Pulse Width: 0.4 ms
Lead Channel Setting Pacing Amplitude: 1.5 V
Lead Channel Setting Pacing Amplitude: 2.5 V
Lead Channel Setting Pacing Pulse Width: 0.4 ms
Lead Channel Setting Sensing Sensitivity: 5.6 mV

## 2019-09-01 NOTE — Progress Notes (Signed)
PPM Remote  

## 2019-09-04 ENCOUNTER — Encounter (HOSPITAL_COMMUNITY): Payer: Self-pay

## 2019-09-04 ENCOUNTER — Ambulatory Visit (HOSPITAL_COMMUNITY)
Admission: RE | Admit: 2019-09-04 | Discharge: 2019-09-04 | Disposition: A | Discharge status: Home / Self Care | Payer: Medicare PPO | Source: Ambulatory Visit | Attending: Cardiology | Admitting: Cardiology

## 2019-09-04 ENCOUNTER — Other Ambulatory Visit: Payer: Self-pay

## 2019-09-04 ENCOUNTER — Other Ambulatory Visit (HOSPITAL_COMMUNITY): Payer: Medicare PPO

## 2019-09-04 DIAGNOSIS — I5032 Chronic diastolic (congestive) heart failure: Secondary | ICD-10-CM

## 2019-09-04 LAB — BASIC METABOLIC PANEL
Anion gap: 9 (ref 5–15)
BUN: 74 mg/dL — ABNORMAL HIGH (ref 8–23)
CO2: 28 mmol/L (ref 22–32)
Calcium: 10 mg/dL (ref 8.9–10.3)
Chloride: 100 mmol/L (ref 98–111)
Creatinine, Ser: 1.95 mg/dL — ABNORMAL HIGH (ref 0.44–1.00)
GFR calc Af Amer: 30 mL/min — ABNORMAL LOW (ref >60)
GFR calc non Af Amer: 26 mL/min — ABNORMAL LOW (ref >60)
Glucose, Bld: 157 mg/dL — ABNORMAL HIGH (ref 70–99)
Potassium: 3.9 mmol/L (ref 3.5–5.1)
Sodium: 137 mmol/L (ref 135–145)

## 2019-09-11 ENCOUNTER — Telehealth: Payer: Medicare PPO | Admitting: Cardiology

## 2019-09-15 ENCOUNTER — Other Ambulatory Visit: Payer: Self-pay

## 2019-09-15 ENCOUNTER — Ambulatory Visit (HOSPITAL_COMMUNITY)
Admission: RE | Admit: 2019-09-15 | Discharge: 2019-09-15 | Disposition: A | Discharge status: Home / Self Care | Payer: Medicare PPO | Source: Ambulatory Visit | Attending: Cardiology | Admitting: Cardiology

## 2019-09-15 DIAGNOSIS — I5032 Chronic diastolic (congestive) heart failure: Secondary | ICD-10-CM | POA: Insufficient documentation

## 2019-09-15 LAB — BASIC METABOLIC PANEL
Anion gap: 11 (ref 5–15)
BUN: 61 mg/dL — ABNORMAL HIGH (ref 8–23)
CO2: 27 mmol/L (ref 22–32)
Calcium: 10.2 mg/dL (ref 8.9–10.3)
Chloride: 99 mmol/L (ref 98–111)
Creatinine, Ser: 1.83 mg/dL — ABNORMAL HIGH (ref 0.44–1.00)
GFR calc Af Amer: 33 mL/min — ABNORMAL LOW (ref >60)
GFR calc non Af Amer: 28 mL/min — ABNORMAL LOW (ref >60)
Glucose, Bld: 175 mg/dL — ABNORMAL HIGH (ref 70–99)
Potassium: 4.4 mmol/L (ref 3.5–5.1)
Sodium: 137 mmol/L (ref 135–145)

## 2019-09-15 NOTE — Progress Notes (Signed)
Virtual Visit via Video Note   This visit type was conducted due to national recommendations for restrictions regarding the COVID-19 Pandemic (e.g. social distancing) in an effort to limit this patient's exposure and mitigate transmission in our community.  Due to her co-morbid illnesses, this patient is at least at moderate risk for complications without adequate follow up.  This format is felt to be most appropriate for this patient at this time.  All issues noted in this document were discussed and addressed.  A limited physical exam was performed with this format.  Please refer to the patient's chart for her consent to telehealth for [facility].   Evaluation Performed:  Follow-up visit  This visit type was conducted due to national recommendations for restrictions regarding the COVID-19 Pandemic (e.g. social distancing).  This format is felt to be most appropriate for this patient at this time.  All issues noted in this document were discussed and addressed.  No physical exam was performed (except for noted visual exam findings with Video Visits).  Please refer to the patient's chart (MyChart message for video visits and phone note for telephone visits) for the patient's consent to telehealth for [facility].  Date:  09/16/2019   ID:  [name] [name], [name] [name] [birth year], MRN [id number]  Patient Location:  Home  Provider location:   [place]  PCP:  [name], MD  Cardiologist:  [name], MD Electrophysiologist:  [name], MD   Chief Complaint:  OSA  History of Present Illness:    [name] [name] is a 69 y.o. female who presents via audio/video conferencing for a telehealth visit today.    [name] [name] a 68y.o.femalewith a hx of chronic diastolic CHF, CAD and afib who was referred for a sleep study by Dr. Aundra Dubin due to excessive daytime sleepiness. She was found to have moderate OSA with an AHI of 22/hr with significant oxygen desaturations as low as  85%. She underwent CPAP titration to 18cm H2Obut did not tolerate the high pressure and was subsequently placed on auto Pap from 6 to 14 cm H2O.  She is doing well with her BIPAP device and thinks that she has gotten used to it.  She tolerates the mask and feels the pressure is adequate.  Since going on BiPAP she feels rested in the am and has no significant daytime sleepiness.  She denies any significant mouth or nasal dryness or nasal congestion.  She does not think that he snores.    The patient does not have symptoms concerning for COVID-19 infection (fever, chills, cough, or new shortness of breath).   Prior CV studies:   The following studies were reviewed today:  PAP compliance download from [facility], outpt labs from Oncology  Past Medical History:  Diagnosis Date  . Anxiety   . Aortic stenosis    a. bicuspid aortic valve; mean gradient of 20 mmHg in 2/10; b. s/p bioprosth AVR (19-mm Edwards pericardial valve) with a redo coronary artery bypass graft procedure in 07/2009; postoperative Dressler's syndrome    . Arthritis   . Breast cancer ([place])   . Carcinoma, renal cell ([place]) 05/2007   Laparoscopic right nephrectomy  . 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 Chronic diastolic CHF (congestive heart failure) ([place])    a. 9.2014 EchoP EF 60-65%, no rwma, bioprosth AVR, mean gradient of 62mHg, mild to mod MR, PASP 538mg.  . CKD (chronic kidney disease), stage II   . Colitis, ischemic ([facility]  . Coronary  artery disease    a. initial CABG with SVG-RCA in 12/05. b. redo SVG-RCA and bioprosthetic AVR in 1/11. d. 9/14 - PCI with DES to distal LM/proximal LAD was done rather than bypass as she would have been a poor candidate for redo sternotomy. d. S/p DES to prox LM 05/2014.  . Diabetes mellitus 03/2010    TYPE II: Hemoglobin A1c of 7.4 in  03/2010; 8.4 in 06/2010; treated with insulin pump  . Duodenal ulcer    Remote; H. pylori positive  . Gastroparesis due to DM ([place]) 05/01/2012   . Genetic testing 01/14/2017   Ms. Bozman underwent genetic counseling and testing for hereditary cancer syndromes on 01/01/2017. Her results were negative for mutations in all 83 genes analyzed by [facility]'s 83-gene Common Hereditary Cancers Panel. Genes analyzed include: ALK, APC, ATM, AXIN2, BAP1, BARD1, BLM, BMPR1A, BRCA1, BRCA2, BRIP1, CASR, CDC73, CDH1, CDK4, CDKN1B, CDKN1C, CDKN2A, CEBPA, CHEK2, CTNNA1, DICER1, DIS3L2,   . Hodgkin's disease ([place]) 1991   Mantle radiation therapy  . Hodgkin's disease ([place])   . Hyperlipidemia   . Hyperplastic gastric polyp 06/23/2012  . Hypertension   . Iron deficiency anemia    a. 03/2013 EGD: essentially normal. Possible slow GIB.  . Migraines   . NASH (nonalcoholic steatohepatitis) 1999   -biopsy in 1999  . OSA on CPAP 04/24/2017   Moderate with AHI 22/hr now on CPAP at 18cm H2o  . Osteopenia     hip on DEXA in October 2007.  . Osteoporosis   . Oxygen deficiency   . Pacemaker    six sinus syndrome  . PAF (paroxysmal atrial fibrillation) ([place])    a.  h/o brief episodes noted on ppm interrogation. b. given GI bleeding and need to be on both ASA 81 and Brilinta, risks likely outweigh benefits of anticoagulation.   . Renal insufficiency   . Small bowel obstruction ([facility])    Recurrent; resolved after resection of a lipoma  . Syncope    a. H/o recurrent syncope with pauses on loop recorder s/p Medtronic pacemaker 07/2012.   Past Surgical History:  Procedure Laterality Date  . ABDOMINAL HYSTERECTOMY    . AORTIC VALVE REPLACEMENT  2011   Aortic valve replacement surgery with a 19 mm bioprosthetic device post redo CABG January 2011.  Marland Kitchen BONE MARROW BIOPSY    . BREAST EXCISIONAL BIOPSY     benign, bilateral  . CARDIAC CATHETERIZATION    . CERVICAL BIOPSY    . COLONOSCOPY     Multiple with adenomatous polyps  . COLONOSCOPY  06/23/2012   Procedure: COLONOSCOPY;  Surgeon: Gatha Mayer, MD;  Location: [facility] ENDOSCOPY;  Service: Endoscopy;  Laterality:  N/A;  . CORONARY ARTERY BYPASS GRAFT  2005, 2011   CABG-most recent SVG to RCA-12/05; RCA occluded with patent graft in 2006; Redo bypass in 07/2009  . ESOPHAGOGASTRODUODENOSCOPY    . ESOPHAGOGASTRODUODENOSCOPY  06/23/2012   Procedure: ESOPHAGOGASTRODUODENOSCOPY (EGD);  Surgeon: Gatha Mayer, MD;  Location: [facility];  Service: Endoscopy;  Laterality: N/A;  . ESOPHAGOGASTRODUODENOSCOPY N/A 04/06/2013   Procedure: ESOPHAGOGASTRODUODENOSCOPY (EGD);  Surgeon: Irene Shipper, MD;  Location: [facility] ENDOSCOPY;  Service: Endoscopy;  Laterality: N/A;  . EXPLORATORY LAPAROTOMY W/ BOWEL RESECTION     Small bowel resection of lipoma  . GIVENS CAPSULE STUDY N/A 04/30/2013   Procedure: GIVENS CAPSULE STUDY;  Surgeon: Gatha Mayer, MD;  Location: [place];  Service: Endoscopy;  Laterality: N/A;  . LEFT AND RIGHT HEART CATHETERIZATION WITH CORONARY ANGIOGRAM N/A 04/07/2013  Procedure: LEFT AND RIGHT HEART CATHETERIZATION WITH CORONARY ANGIOGRAM;  Surgeon: Larey Dresser, MD;  Location: [facility] CATH LAB;  Service: Cardiovascular;  Laterality: N/A;  . LEFT ATRIAL APPENDAGE OCCLUSION N/A 02/16/2016   Watchman not placed - nickel allergy  . LEFT HEART CATHETERIZATION WITH CORONARY/GRAFT ANGIOGRAM N/A 06/07/2014   Procedure: LEFT HEART CATHETERIZATION WITH Beatrix Fetters;  Surgeon: Burnell Blanks, MD;  Location: [facility] CATH LAB;  Service: Cardiovascular;  Laterality: N/A;  . LIVER BIOPSY    . LOOP RECORDER IMPLANT N/A 12/10/2011   Procedure: LOOP RECORDER IMPLANT;  Surgeon: Evans Lance, MD;  Location: [facility] CATH LAB;  Service: Cardiovascular;  Laterality: N/A;  . MALONEY DILATION  06/23/2012   Procedure: [name] DILATION;  Surgeon: Gatha Mayer, MD;  Location: [facility] ENDOSCOPY;  Service: Endoscopy;;  54 fr  . MASTECTOMY Left 05/17/2015   total   . NEPHRECTOMY  2008   Laparoscopic right nephrectomy, renal cell carcinoma  . PACEMAKER INSERTION  08/21/2012   Medtronic Adapta L dual-chamber pacemaker  .  PERCUTANEOUS CORONARY STENT INTERVENTION (PCI-S) N/A 04/09/2013   Procedure: PERCUTANEOUS CORONARY STENT INTERVENTION (PCI-S);  Surgeon: Burnell Blanks, MD;  Location: [facility] CATH LAB;  Service: Cardiovascular;  Laterality: N/A;  . PERMANENT PACEMAKER INSERTION N/A 08/21/2012   Procedure: PERMANENT PACEMAKER INSERTION;  Surgeon: Evans Lance, MD;  Location: [facility] CATH LAB;  Service: Cardiovascular;  Laterality: N/A;  . RIGHT HEART CATH N/A 03/18/2019   Procedure: RIGHT HEART CATH;  Surgeon: Larey Dresser, MD;  Location: [place] CV LAB;  Service: Cardiovascular;  Laterality: N/A;  . RIGHT HEART CATH AND CORONARY/GRAFT ANGIOGRAPHY N/A 04/19/2017   Procedure: RIGHT HEART CATH AND CORONARY/GRAFT ANGIOGRAPHY;  Surgeon: Larey Dresser, MD;  Location: [place] CV LAB;  Service: Cardiovascular;  Laterality: N/A;  . TEE WITHOUT CARDIOVERSION N/A 12/07/2015   Procedure: TRANSESOPHAGEAL ECHOCARDIOGRAM (TEE);  Surgeon: Larey Dresser, MD;  Location: [place];  Service: Cardiovascular;  Laterality: N/A;  . TOTAL ABDOMINAL HYSTERECTOMY W/ BILATERAL SALPINGOOPHORECTOMY  2000   endometriosis and ovarian cysts  . TOTAL MASTECTOMY Left 05/17/2015   Procedure: LEFT TOTAL MASTECTOMY;  Surgeon: Rolm Bookbinder, MD;  Location: [place];  Service: General;  Laterality: Left;  . TUMOR EXCISION  1981   removal of Hodgkins lymphoma     Current Meds  Medication Sig  . acyclovir ointment (ZOVIRAX) 5 % Apply 1 application topically every 3 (three) hours as needed (fever blister/cold sores).  . ALPRAZolam (XANAX) 0.5 MG tablet Take 1 tablet (0.5 mg total) by mouth 2 (two) times daily as needed for anxiety. (Patient taking differently: Take 0.25-0.5 mg by mouth See admin instructions. Take 0.25 mg at bedtime, may take a additional 0.25-0.5 mg dose during the day as needed for anxiety)  . amLODipine (NORVASC) 2.5 MG tablet TAKE ONE (1) TABLET BY MOUTH EVERY DAY  . APIDRA 100 UNIT/ML injection Inject 40 Units  into the skin as directed. INFUSE THROUGH INSULIN PUMP UTD, Bolus depends on carb intake  . aspirin EC 81 MG tablet Take 81 mg by mouth at bedtime.  Marland Kitchen BRILINTA 60 MG TABS tablet TAKE ONE TABLET (60MG TOTAL) BY MOUTH TWICE DAILY  . clobetasol (OLUX) 0.05 % topical foam Apply 1 application topically 2 (two) times daily as needed (skin flaking).  . Coenzyme Q10 (CO Q 10) 100 MG CAPS Take 100 mg by mouth at bedtime.   . colchicine 0.6 MG tablet Take 0.6 mg by mouth daily as needed (gout flare).  Marland Kitchen  CONTOUR NEXT TEST test strip USE TO TEST BLOOD SUGAR UP TO EIGHT TIMES A DAY  . docusate sodium (COLACE) 100 MG capsule Take 100 mg by mouth 2 (two) times daily.   Marland Kitchen erythromycin with ethanol (THERAMYCIN) 2 % external solution Apply topically as needed.   . Evolocumab (REPATHA SURECLICK) 710 MG/ML SOAJ Inject 140 mg into the skin every 14 (fourteen) days.  . famotidine (PEPCID) 40 MG tablet Take 40 mg by mouth daily.  . Fe Fum-FA-B Cmp-C-Zn-Mg-Mn-Cu (HEMATINIC PLUS VIT/MINERALS) 106-1 MG TABS TAKE ONE TABLET BY MOUTH THREE TIMES A WEEK (MONDAY, WEDNESDAY, AND FRIDAY) OR AS DIRECTED WITH A VITAMIN C  . glucagon (GLUCAGON EMERGENCY) 1 MG injection Inject 1 mg into the vein once as needed (low blood sugar).   . isosorbide mononitrate (IMDUR) 120 MG 24 hr tablet TAKE ONE TABLET (120MG TOTAL) BY MOUTH DAILY (Patient taking differently: Take 120 mg by mouth at bedtime. )  . latanoprost (XALATAN) 0.005 % ophthalmic solution Place 1 drop into both eyes at bedtime.  Marland Kitchen loratadine (CLARITIN) 10 MG tablet Take 10 mg by mouth daily as needed for allergies.  Marland Kitchen MAGNESIUM CITRATE PO Take 250 mg by mouth daily.  . metolazone (ZAROXOLYN) 2.5 MG tablet Take 2.5 mg (1 tab) once as directed by CHF clinic. Call before taking [phone]  . metoprolol tartrate (LOPRESSOR) 50 MG tablet TAKE ONE AND ONE-HALF TABLETS (75MG TOTAL) BY MOUTH TWICE DAILY  . mupirocin cream (BACTROBAN) 2 % Apply 1 application topically 2 (two) times  daily as needed.  . nitroGLYCERIN (NITRODUR - DOSED IN MG/24 HR) 0.2 mg/hr patch Apply 1/4 patch to area daily prn  . nitroGLYCERIN (NITROLINGUAL) 0.4 MG/SPRAY spray PLACE 1 SPRAY UNDER THE TONGUE EVERY 5 MINUTES FOR 3 DOSES AS NEEDED FOR CHEST PAIN.  Marland Kitchen Polyethyl Glycol-Propyl Glycol (SYSTANE) 0.4-0.3 % SOLN Apply 1 drop to eye daily as needed (dry eyes).  . pravastatin (PRAVACHOL) 10 MG tablet TAKE ONE TABLET (10MG TOTAL) BY MOUTH DAILY  . silver nitrate applicators 70-35 % applicator Use as needed to stop superficial skin bleeding.  Marland Kitchen spironolactone (ALDACTONE) 25 MG tablet TAKE ONE TABLET (25MG TOTAL) BY MOUTH DAILY (Patient taking differently: Take 12.5 mg by mouth daily. )  . torsemide (DEMADEX) 20 MG tablet Take by mouth. Take 2 tablets every AM and half tablet every PM  . ULORIC 40 MG tablet Take 1 tablet (40 mg total) by mouth at bedtime.  Marland Kitchen UNABLE TO FIND Med Name: So Clean CPAP cleaning system  . VASCEPA 1 g CAPS TAKE 2 CAPSULES (2 GRAMS TOTAL) BY MOUTHTWO TIMES DAILY (Patient taking differently: Take 2 g by mouth 2 (two) times daily. )  . Wheat Dextrin (BENEFIBER) POWD Take 1 Dose by mouth daily.     Allergies:   Avandia [rosiglitazone maleate], Cephalexin, Clindamycin hcl, Humalog [insulin lispro], Lincomycin, Metformin, Novolog [insulin aspart], Penicillins, Rosiglitazone, Tizanidine, Adhesive [tape], Atorvastatin, Cholestyramine, Gentamycin [gentamicin], Iodinated diagnostic agents, Limonene, Pravastatin, Prednisone, Ranexa [ranolazine], Rosuvastatin, Simvastatin, Strawberry extract, Clopidogrel, Codeine, Erythromycin, Hydrocodone-acetaminophen, Latex, Neomycin, Nickel, Ranitidine, Tamiflu [oseltamivir], and Tramadol   Social History   Tobacco Use  . Smoking status: Never Smoker  . Smokeless tobacco: Never Used  Substance Use Topics  . Alcohol use: No    Alcohol/week: 0.0 standard drinks  . Drug use: No     Family Hx: The patient's family history includes Breast cancer (age  of onset: 35) in her sister; Cancer (age of onset: 61) in her sister; Cancer (age of onset: 51)  in her maternal grandfather; Diabetes in her father; Glaucoma in her brother; Heart attack in her father, maternal grandmother, and mother; Heart disease in her father and mother; Hypertension in her brother; Lung cancer in her sister. There is no history of Colon cancer or Stomach cancer.  ROS:   Please see the history of present illness.     All other systems reviewed and are negative.   Labs/Other Tests and Data Reviewed:    Recent Labs: 04/08/2019: B Natriuretic Peptide 111.8 04/13/2019: TSH 2.141 08/10/2019: ALT 24; Hemoglobin 11.7; Platelets 121 09/15/2019: BUN 61; Creatinine, Ser 1.83; Potassium 4.4; Sodium 137   Recent Lipid Panel Lab Results  Component Value Date/Time   CHOL 89 03/03/2019 11:09 AM   TRIG 147 03/03/2019 11:09 AM   HDL 38 (L) 03/03/2019 11:09 AM   CHOLHDL 2.3 03/03/2019 11:09 AM   LDLCALC 22 03/03/2019 11:09 AM   LDLDIRECT 43.0 12/14/2014 09:38 AM    Wt Readings from Last 3 Encounters:  09/16/19 109 lb (49.4 kg)  08/25/19 114 lb (51.7 kg)  07/30/19 113 lb 6.4 oz (51.4 kg)     Objective:    Vital Signs:  BP 118/73   Pulse 68   Temp (!) 96.7 F (35.9 C)   Ht 5' 3"  (1.6 m)   Wt 109 lb (49.4 kg)   SpO2 97%   BMI 19.31 kg/m    CONSTITUTIONAL:  Well nourished, well developed female in no acute distress.  EYES: anicteric MOUTH: oral mucosa is pink RESPIRATORY: Normal respiratory effort, symmetric expansion CARDIOVASCULAR: No peripheral edema SKIN: No rash, lesions or ulcers MUSCULOSKELETAL: no digital cyanosis NEURO: Cranial Nerves II-XII grossly intact, moves all extremities PSYCH: Intact judgement and insight.  A&O x 3, Mood/affect appropriate   ASSESSMENT & PLAN:    1.  OSA - The patient is tolerating PAP therapy well without any problems. The PAP download was reviewed today and showed an AHI of 13.6/hr on auto BiPAP cm H2O with 97% compliance in  using more than 4 hours nightly.  The patient has been using and benefiting from PAP use and will continue to benefit from therapy.   2.  HTN -BP controlled -continue amlodipine 2.20m daily, Lopressor 77mBID and spiro 2515maily -creatinine was 1.83 on 09/15/2019 and K+ 4.4  3.  CKD stage 4 -followed by PCP and nephrology  4.  Restless legs syndrome -she is having a lot of problems with restless legs that is really disrupting her sleep -she is now having insomnia because she is having to nap during the day because her restless legs is keeping her up -She has chronic anemia from her CKD and last Hbg was 11.1 in January -I will check a ferritin level and repeat H/H -start Requip 0.52m65mily   COVID-19 Education: The signs and symptoms of COVID-19 were discussed with the patient and how to seek care for testing (follow up with PCP or arrange E-visit).  The importance of social distancing was discussed today.  Patient Risk:   After full review of this patient's clinical status, I feel that they are at least moderate risk at this time.  Time:   Today, I have spent 20 minutes on telemedicine discussing medical problems including OSA< HTN, CKD and reviewing patient's chart including [facility] PAP download.  Medication Adjustments/Labs and Tests Ordered: Current medicines are reviewed at length with the patient today.  Concerns regarding medicines are outlined above.  Tests Ordered: No orders of the defined types were placed in  this encounter.  Medication Changes: No orders of the defined types were placed in this encounter.   Disposition:  Follow up 2 weeks to followup on restless legs syndrome  Signed, Fransico Him, MD  09/16/2019 8:11 AM    [place]

## 2019-09-15 NOTE — Addendum Note (Signed)
Encounter addended by: Harvie Junior, CMA on: 09/15/2019 9:42 AM  Actions taken: Order list changed, Diagnosis association updated

## 2019-09-16 ENCOUNTER — Other Ambulatory Visit: Payer: Medicare PPO | Admitting: *Deleted

## 2019-09-16 ENCOUNTER — Encounter: Payer: Self-pay | Admitting: Cardiology

## 2019-09-16 ENCOUNTER — Telehealth (INDEPENDENT_AMBULATORY_CARE_PROVIDER_SITE_OTHER): Payer: Medicare PPO | Admitting: Cardiology

## 2019-09-16 VITALS — BP 118/73 | HR 68 | Temp 96.7°F | Ht 63.0 in | Wt 109.0 lb

## 2019-09-16 DIAGNOSIS — Z9989 Dependence on other enabling machines and devices: Secondary | ICD-10-CM

## 2019-09-16 DIAGNOSIS — G4733 Obstructive sleep apnea (adult) (pediatric): Secondary | ICD-10-CM

## 2019-09-16 DIAGNOSIS — G2581 Restless legs syndrome: Secondary | ICD-10-CM

## 2019-09-16 DIAGNOSIS — I129 Hypertensive chronic kidney disease with stage 1 through stage 4 chronic kidney disease, or unspecified chronic kidney disease: Secondary | ICD-10-CM

## 2019-09-16 DIAGNOSIS — N184 Chronic kidney disease, stage 4 (severe): Secondary | ICD-10-CM

## 2019-09-16 DIAGNOSIS — I10 Essential (primary) hypertension: Secondary | ICD-10-CM

## 2019-09-16 NOTE — Patient Instructions (Signed)
Medication Instructions:  Your physician has recommended you make the following change in your medication: A prescription for requip will be called to your pharmacy  *If you need a refill on your cardiac medications before your next appointment, please call your pharmacy*  Lab Work: Lab work is scheduled for today at 3:45--CBC and ferritin If you have labs (blood work) drawn today and your tests are completely normal, you will receive your results only by: Marland Kitchen MyChart Message (if you have MyChart) OR . A paper copy in the mail If you have any lab test that is abnormal or we need to change your treatment, we will call you to review the results.  Testing/Procedures: none  Follow-Up: At [facility], you and your health needs are our priority.  As part of our continuing mission to provide you with exceptional heart care, we have created designated Provider Care Teams.  These Care Teams include your primary Cardiologist (physician) and Advanced Practice Providers (APPs -  Physician Assistants and Nurse Practitioners) who all work together to provide you with the care you need, when you need it.  Your next appointment:   March 9,2021 at 11:40  The format for your next appointment:   virtual  Provider:   Dr Radford Pax  Other Instructions

## 2019-09-17 ENCOUNTER — Other Ambulatory Visit: Payer: Self-pay

## 2019-09-17 ENCOUNTER — Telehealth: Payer: Self-pay | Admitting: Cardiology

## 2019-09-17 LAB — CBC
Hematocrit: 38.4 % (ref 34.0–46.6)
Hemoglobin: 12.4 g/dL (ref 11.1–15.9)
MCH: 29.8 pg (ref 26.6–33.0)
MCHC: 32.3 g/dL (ref 31.5–35.7)
MCV: 92 fL (ref 79–97)
Platelets: 120 10*3/uL — ABNORMAL LOW (ref 150–450)
RBC: 4.16 x10E6/uL (ref 3.77–5.28)
RDW: 13.2 % (ref 11.7–15.4)
WBC: 8.6 10*3/uL (ref 3.4–10.8)

## 2019-09-17 LAB — FERRITIN: Ferritin: 294 ng/mL — ABNORMAL HIGH (ref 15–150)

## 2019-09-17 MED ORDER — ROPINIROLE HCL 0.25 MG PO TABS: 0.2500 mg | ORAL_TABLET | Freq: Every day | ORAL | 3 refills | Status: DC

## 2019-09-17 NOTE — Telephone Encounter (Signed)
Patient returning call about her lab results.

## 2019-09-17 NOTE — Telephone Encounter (Signed)
Patient is calling back to see if Dr. Radford Pax had decided on a dose of Requip for her based on her lab results. Advised her that Dr. Radford Pax has prescribed Requip 0.25 mg daily to be taken two hours before bed time. Patient verbalized understanding.  She will also follow up with PCP and nephrologist regarding ferritin levels.

## 2019-09-17 NOTE — Progress Notes (Signed)
[name] Margarita, MD  Antonieta Iba, RN  Start Requip 0.44m take 2 hours before bed nightly   Traci

## 2019-09-23 ENCOUNTER — Telehealth: Payer: Self-pay | Admitting: *Deleted

## 2019-09-23 NOTE — Telephone Encounter (Signed)
Informed patient of compliance results and verbalized understanding was indicated. Patient is aware and agreeable to AHI being within range at 0.3. Patient is aware and agreeable to being in compliance with machine usage. Patient is aware and agreeable to no change in current pressures. 

## 2019-09-23 NOTE — Telephone Encounter (Signed)
-----   Message from Sueanne Margarita, MD sent at 09/21/2019  5:16 PM EST ----- Good AHI and compliance.  Continue current PAP settings.

## 2019-09-29 ENCOUNTER — Encounter: Payer: Self-pay | Admitting: Cardiology

## 2019-09-29 ENCOUNTER — Other Ambulatory Visit: Payer: Self-pay

## 2019-09-29 ENCOUNTER — Telehealth (INDEPENDENT_AMBULATORY_CARE_PROVIDER_SITE_OTHER): Payer: Medicare PPO | Admitting: Cardiology

## 2019-09-29 ENCOUNTER — Telehealth: Payer: Self-pay | Admitting: *Deleted

## 2019-09-29 VITALS — BP 133/82 | HR 68 | Ht 63.0 in | Wt 109.0 lb

## 2019-09-29 DIAGNOSIS — G4733 Obstructive sleep apnea (adult) (pediatric): Secondary | ICD-10-CM

## 2019-09-29 DIAGNOSIS — I10 Essential (primary) hypertension: Secondary | ICD-10-CM | POA: Diagnosis not present

## 2019-09-29 DIAGNOSIS — G2581 Restless legs syndrome: Secondary | ICD-10-CM

## 2019-09-29 DIAGNOSIS — Z9989 Dependence on other enabling machines and devices: Secondary | ICD-10-CM | POA: Diagnosis not present

## 2019-09-29 NOTE — Progress Notes (Signed)
Virtual Visit via Video Note   This visit type was conducted due to national recommendations for restrictions regarding the COVID-19 Pandemic (e.g. social distancing) in an effort to limit this patient's exposure and mitigate transmission in our community.  Due to her co-morbid illnesses, this patient is at least at moderate risk for complications without adequate follow up.  This format is felt to be most appropriate for this patient at this time.  All issues noted in this document were discussed and addressed.  A limited physical exam was performed with this format.  Please refer to the patient's chart for her consent to telehealth for [facility].   Evaluation Performed:  Follow-up visit  This visit type was conducted due to national recommendations for restrictions regarding the COVID-19 Pandemic (e.g. social distancing).  This format is felt to be most appropriate for this patient at this time.  All issues noted in this document were discussed and addressed.  No physical exam was performed (except for noted visual exam findings with Video Visits).  Please refer to the patient's chart (MyChart message for video visits and phone note for telephone visits) for the patient's consent to telehealth for [facility].  Date:  09/29/2019   ID:  [name] [name], [name] [name] 14-Nov-1950, MRN [id number]  Patient Location:  Home  Provider location:   [place]  PCP:  [name], MD  Cardiologist:  [name], MD Sleep Medicine:  Fransico Him, MD Electrophysiologist:  [name], MD   Chief Complaint:  OSA  History of Present Illness:    [name] [name] is a 69 y.o. female who presents via audio/video conferencing for a telehealth visit today.    [name] C Lawrenceis a 68y.o.femalewith a hx of chronic diastolic CHF, CAD and afib who was referred for a sleep study by Dr. Aundra Dubin due to excessive daytime sleepiness. She was found to have moderate OSA with an AHI of 22/hr with  significant oxygen desaturations as low as 85%. She underwent CPAP titration to 18cm H2Obut did not tolerate the high pressure and was subsequently placed on auto Pap from 6 to 14 cm H2O.  When I saw her last she was having a lot of problems with restless legs and I started her on Requip 0.68m nightly.  Ferritin was normal.  Her AHI was also too high as well.  She was having problems with sleeping due to the restless legs disrupting her sleep and then getting insomnia and napping during the day.   She is doing well with her CPAP device and thinks that she has gotten used to it.  She tolerates the mask.  She feels more rested in the am now after starting Requip for RLS which has improved.  She is having problems with mouth dryness and running out of water during the night in her PAP reservoir. She has to get up in the middle of the night to add more water to the reservoir so still feels some sleepy in the am but not as much.   She is also having problems with belching air in the am after she gets up which is new.   The patient does not have symptoms concerning for COVID-19 infection (fever, chills, cough, or new shortness of breath).   Prior CV studies:   The following studies were reviewed today:  PAP compliance download  Past Medical History:  Diagnosis Date  . Anxiety   . Aortic stenosis    a. bicuspid aortic valve; mean gradient of 20  mmHg in 2/10; b. s/p bioprosth AVR (19-mm Edwards pericardial valve) with a redo coronary artery bypass graft procedure in 07/2009; postoperative Dressler's syndrome    . Arthritis   . Breast cancer ([place])   . Carcinoma, renal cell ([place]) 05/2007   Laparoscopic right nephrectomy  . 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 Chronic diastolic CHF (congestive heart failure) ([place])    a. 9.2014 EchoP EF 60-65%, no rwma, bioprosth AVR, mean gradient of 47mHg, mild to mod MR, PASP 571mg.  . CKD (chronic kidney disease), stage II   . Colitis, ischemic ([facility] 2008  . Coronary artery disease    a. initial CABG with SVG-RCA in 12/05. b. redo SVG-RCA and bioprosthetic AVR in 1/11. d. 9/14 - PCI with DES to distal LM/proximal LAD was done rather than bypass as she would have been a poor candidate for redo sternotomy. d. S/p DES to prox LM 05/2014.  . Diabetes mellitus 03/2010    TYPE II: Hemoglobin A1c of 7.4 in  03/2010; 8.4 in 06/2010; treated with insulin pump  . Duodenal ulcer    Remote; H. pylori positive  . Gastroparesis due to DM ([facility]10/04/2012  . Genetic testing 01/14/2017   Ms. Skidgel underwent genetic counseling and testing for hereditary cancer syndromes on 01/01/2017. Her results were negative for mutations in all 83 genes analyzed by [facility]'s 83-gene Common Hereditary Cancers Panel. Genes analyzed include: ALK, APC, ATM, AXIN2, BAP1, BARD1, BLM, BMPR1A, BRCA1, BRCA2, BRIP1, CASR, CDC73, CDH1, CDK4, CDKN1B, CDKN1C, CDKN2A, CEBPA, CHEK2, CTNNA1, DICER1, DIS3L2,   . Hodgkin's disease ([facility]1991   M[place]le radiation therapy  . Hodgkin's disease ([facility]  . Hyperlipidemia   . Hyperplastic gastric polyp 06/23/2012  . Hypertension   . Iron deficiency anemia    a. 03/2013 EGD: essentially normal. Possible slow GIB.  . Migraines   . NASH (nonalcoholic steatohepatitis) 1999   -biopsy in 1999  . OSA on CPAP 04/24/2017   Moderate with AHI 22/hr now on CPAP at 18cm H2o  . Osteopenia     hip on DEXA in October 2007.  . Osteoporosis   . Oxygen deficiency   . Pacemaker    six sinus syndrome  . PAF (paroxysmal atrial fibrillation) ([facility] [place]   a.  h/o brief episodes noted on ppm interrogation. b. given GI bleeding and need to be on both ASA 81 and Brilinta, risks likely outweigh benefits of anticoagulation.   . Renal insufficiency   . Small bowel obstruction ([facility])    Recurrent; resolved after resection of a lipoma  . Syncope    a. H/o recurrent syncope with pauses on loop recorder s/p Medtronic pacemaker 07/2012.   Past Surgical History:  Procedure  Laterality Date  . ABDOMINAL HYSTERECTOMY    . AORTIC VALVE REPLACEMENT  2011   Aortic valve replacement surgery with a 19 mm bioprosthetic device post redo CABG January 2011.  . Marland KitchenONE MARROW BIOPSY    . BREAST EXCISIONAL BIOPSY     benign, bilateral  . CARDIAC CATHETERIZATION    . CERVICAL BIOPSY    . COLONOSCOPY     Multiple with adenomatous polyps  . COLONOSCOPY  06/23/2012   Procedure: COLONOSCOPY;  Surgeon: CaGatha MayerMD;  Location: [facility] ENDOSCOPY;  Service: Endoscopy;  Laterality: N/A;  . CORONARY ARTERY BYPASS GRAFT  2005, 2011   CABG-most recent SVG to RCA-12/05; RCA occluded with patent graft in 2006; Redo bypass in 07/2009  . ESOPHAGOGASTRODUODENOSCOPY    . ESOPHAGOGASTRODUODENOSCOPY  06/23/2012   Procedure: ESOPHAGOGASTRODUODENOSCOPY (EGD);  Surgeon: Gatha Mayer, MD;  Location: [facility];  Service: Endoscopy;  Laterality: N/A;  . ESOPHAGOGASTRODUODENOSCOPY N/A 04/06/2013   Procedure: ESOPHAGOGASTRODUODENOSCOPY (EGD);  Surgeon: Irene Shipper, MD;  Location[facility] ENDOSCOPY;  Service: Endoscopy;  Laterality: N/A;  . EXPLORATORY LAPAROTOMY W/ BOWEL RESECTION     Small bowel resection of lipoma  . GIVENS CAPSULE STUDY N/A 04/30/2013   Procedure: GIVENS CAPSULE STUDY;  Surgeon: Gatha Mayer, MD;  Location: [place];  Service: Endoscopy;  Laterality: N/A;  . LEFT AND RIGHT HEART CATHETERIZATION WITH CORONARY ANGIOGRAM N/A 04/07/2013   Procedure: LEFT AND RIGHT HEART CATHETERIZATION WITH CORONARY ANGIOGRAM;  Surgeon: Larey Dresser, MD;  Location[facility] C[facility] LAB;  Service: Cardiovascular;  Laterality: N/A;  . LEFT ATRIAL APPENDAGE OCCLUSION N/A 02/16/2016   Watchman not placed - nickel allergy  . LEFT HEART CATHETERIZATION WITH CORONARY/GRAFT ANGIOGRAM N/A 06/07/2014   Procedure: LEFT HEART CATHETERIZATION WITH Beatrix Fetters;  Surgeon: Burnell Blanks, MD;  Location[facility] C[facility] LAB;  Service: Cardiovascular;  Laterality: N/A;  . LIVER BIOPSY    . LOOP RECORDER  IMPLANT N/A 12/10/2011   Procedure: LOOP RECORDER IMPLANT;  Surgeon: Evans Lance, MD;  Location: [facility] CATH LAB;  Service: Cardiovascular;  Laterality: N/A;  . MALONEY DILATION  06/23/2012   Procedure: [name] DILATION;  Surgeon: Gatha Mayer, MD;  Location: [facility] ENDOSCOPY;  Service: Endoscopy;;  54 fr  . MASTECTOMY Left 05/17/2015   total   . NEPHRECTOMY  2008   Laparoscopic right nephrectomy, renal cell carcinoma  . PACEMAKER INSERTION  08/21/2012   Medtronic Adapta L dual-chamber pacemaker  . PERCUTANEOUS CORONARY STENT INTERVENTION (PCI-S) N/A 04/09/2013   Procedure: PERCUTANEOUS CORONARY STENT INTERVENTION (PCI-S);  Surgeon: Burnell Blanks, MD;  Location: [facility] CATH LAB;  Service: Cardiovascular;  Laterality: N/A;  . PERMANENT PACEMAKER INSERTION N/A 08/21/2012   Procedure: PERMANENT PACEMAKER INSERTION;  Surgeon: Evans Lance, MD;  Location: [facility] CATH LAB;  Service: Cardiovascular;  Laterality: N/A;  . RIGHT HEART CATH N/A 03/18/2019   Procedure: RIGHT HEART CATH;  Surgeon: Larey Dresser, MD;  Location: [place] CV LAB;  Service: Cardiovascular;  Laterality: N/A;  . RIGHT HEART CATH AND CORONARY/GRAFT ANGIOGRAPHY N/A 04/19/2017   Procedure: RIGHT HEART CATH AND CORONARY/GRAFT ANGIOGRAPHY;  Surgeon: Larey Dresser, MD;  Location: [place] CV LAB;  Service: Cardiovascular;  Laterality: N/A;  . TEE WITHOUT CARDIOVERSION N/A 12/07/2015   Procedure: TRANSESOPHAGEAL ECHOCARDIOGRAM (TEE);  Surgeon: Larey Dresser, MD;  Location: [place];  Service: Cardiovascular;  Laterality: N/A;  . TOTAL ABDOMINAL HYSTERECTOMY W/ BILATERAL SALPINGOOPHORECTOMY  2000   endometriosis and ovarian cysts  . TOTAL MASTECTOMY Left 05/17/2015   Procedure: LEFT TOTAL MASTECTOMY;  Surgeon: Rolm Bookbinder, MD;  Location: [place];  Service: General;  Laterality: Left;  . TUMOR EXCISION  1981   removal of Hodgkins lymphoma     Current Meds  Medication Sig  . acyclovir ointment (ZOVIRAX) 5 % Apply 1  application topically every 3 (three) hours as needed (fever blister/cold sores).  . ALPRAZolam (XANAX) 0.5 MG tablet Take 1 tablet (0.5 mg total) by mouth 2 (two) times daily as needed for anxiety. (Patient taking differently: Take 0.25-0.5 mg by mouth See admin instructions. Take 0.25 mg at bedtime, may take a additional 0.25-0.5 mg dose during the day as needed for anxiety)  . amLODipine (NORVASC) 2.5 MG tablet TAKE ONE (1) TABLET BY MOUTH EVERY DAY  .  APIDRA 100 UNIT/ML injection Inject 40 Units into the skin as directed. INFUSE THROUGH INSULIN PUMP UTD, Bolus depends on carb intake  . aspirin EC 81 MG tablet Take 81 mg by mouth at bedtime.  Marland Kitchen BRILINTA 60 MG TABS tablet TAKE ONE TABLET (60MG TOTAL) BY MOUTH TWICE DAILY  . clobetasol (OLUX) 0.05 % topical foam Apply 1 application topically 2 (two) times daily as needed (skin flaking).  . Coenzyme Q10 (CO Q 10) 100 MG CAPS Take 100 mg by mouth at bedtime.   . colchicine 0.6 MG tablet Take 0.6 mg by mouth daily as needed (gout flare).  . CONTOUR NEXT TEST test strip USE TO TEST BLOOD SUGAR UP TO EIGHT TIMES A DAY  . docusate sodium (COLACE) 100 MG capsule Take 100 mg by mouth 2 (two) times daily.   Marland Kitchen erythromycin with ethanol (THERAMYCIN) 2 % external solution Apply topically as needed.   . Evolocumab (REPATHA SURECLICK) 458 MG/ML SOAJ Inject 140 mg into the skin every 14 (fourteen) days.  . famotidine (PEPCID) 40 MG tablet Take 40 mg by mouth daily.  . Fe Fum-FA-B Cmp-C-Zn-Mg-Mn-Cu (HEMATINIC PLUS VIT/MINERALS) 106-1 MG TABS TAKE ONE TABLET BY MOUTH THREE TIMES A WEEK (MONDAY, WEDNESDAY, AND FRIDAY) OR AS DIRECTED WITH A VITAMIN C  . glucagon (GLUCAGON EMERGENCY) 1 MG injection Inject 1 mg into the vein once as needed (low blood sugar).   . isosorbide mononitrate (IMDUR) 120 MG 24 hr tablet TAKE ONE TABLET (120MG TOTAL) BY MOUTH DAILY (Patient taking differently: Take 120 mg by mouth at bedtime. )  . latanoprost (XALATAN) 0.005 % ophthalmic  solution Place 1 drop into both eyes at bedtime.  Marland Kitchen loratadine (CLARITIN) 10 MG tablet Take 10 mg by mouth daily as needed for allergies.  Marland Kitchen MAGNESIUM CITRATE PO Take 250 mg by mouth daily.  . metolazone (ZAROXOLYN) 2.5 MG tablet Take 2.5 mg (1 tab) once as directed by CHF clinic. Call before taking [phone]  . metoprolol tartrate (LOPRESSOR) 50 MG tablet TAKE ONE AND ONE-HALF TABLETS (75MG TOTAL) BY MOUTH TWICE DAILY  . mupirocin cream (BACTROBAN) 2 % Apply 1 application topically 2 (two) times daily as needed.  . nitroGLYCERIN (NITRODUR - DOSED IN MG/24 HR) 0.2 mg/hr patch Apply 1/4 patch to area daily prn  . nitroGLYCERIN (NITROLINGUAL) 0.4 MG/SPRAY spray PLACE 1 SPRAY UNDER THE TONGUE EVERY 5 MINUTES FOR 3 DOSES AS NEEDED FOR CHEST PAIN.  Marland Kitchen Polyethyl Glycol-Propyl Glycol (SYSTANE) 0.4-0.3 % SOLN Apply 1 drop to eye daily as needed (dry eyes).  . pravastatin (PRAVACHOL) 10 MG tablet TAKE ONE TABLET (10MG TOTAL) BY MOUTH DAILY  . rOPINIRole (REQUIP) 0.25 MG tablet Take 1 tablet (0.25 mg total) by mouth daily. Take two hours prior to bedtime every night  . silver nitrate applicators 53-97 % applicator Use as needed to stop superficial skin bleeding.  Marland Kitchen spironolactone (ALDACTONE) 25 MG tablet TAKE ONE TABLET (25MG TOTAL) BY MOUTH DAILY (Patient taking differently: Take 12.5 mg by mouth daily. )  . torsemide (DEMADEX) 20 MG tablet Take by mouth. Take 2 tablets every AM and half tablet every PM  . ULORIC 40 MG tablet Take 1 tablet (40 mg total) by mouth at bedtime.  Marland Kitchen UNABLE TO FIND Med Name: So Clean CPAP cleaning system  . VASCEPA 1 g CAPS TAKE 2 CAPSULES (2 GRAMS TOTAL) BY MOUTHTWO TIMES DAILY (Patient taking differently: Take 2 g by mouth 2 (two) times daily. )  . Wheat Dextrin (BENEFIBER) POWD Take 1 Dose  by mouth daily.     Allergies:   Avandia [rosiglitazone maleate], Cephalexin, Clindamycin hcl, Humalog [insulin lispro], Lincomycin, Metformin, Novolog [insulin aspart], Penicillins,  Rosiglitazone, Tizanidine, Adhesive [tape], Atorvastatin, Cholestyramine, Gentamycin [gentamicin], Iodinated diagnostic agents, Limonene, Pravastatin, Prednisone, Ranexa [ranolazine], Rosuvastatin, Simvastatin, Strawberry extract, Clopidogrel, Codeine, Erythromycin, Hydrocodone-acetaminophen, Latex, Neomycin, Nickel, Ranitidine, Tamiflu [oseltamivir], and Tramadol   Social History   Tobacco Use  . Smoking status: Never Smoker  . Smokeless tobacco: Never Used  Substance Use Topics  . Alcohol use: No    Alcohol/week: 0.0 standard drinks  . Drug use: No     Family Hx: The patient's family history includes Breast cancer (age of onset: 22) in her sister; Cancer (age of onset: 6) in her sister; Cancer (age of onset: 35) in her maternal grandfather; Diabetes in her father; Glaucoma in her brother; Heart attack in her father, maternal grandmother, and mother; Heart disease in her father and mother; Hypertension in her brother; Lung cancer in her sister. There is no history of Colon cancer or Stomach cancer.  ROS:   Please see the history of present illness.     All other systems reviewed and are negative.   Labs/Other Tests and Data Reviewed:    Recent Labs: 04/08/2019: B Natriuretic Peptide 111.8 04/13/2019: TSH 2.141 08/10/2019: ALT 24 09/15/2019: BUN 61; Creatinine, Ser 1.83; Potassium 4.4; Sodium 137 09/16/2019: Hemoglobin 12.4; Platelets 120   Recent Lipid Panel Lab Results  Component Value Date/Time   CHOL 89 03/03/2019 11:09 AM   TRIG 147 03/03/2019 11:09 AM   HDL 38 (L) 03/03/2019 11:09 AM   CHOLHDL 2.3 03/03/2019 11:09 AM   LDLCALC 22 03/03/2019 11:09 AM   LDLDIRECT 43.0 12/14/2014 09:38 AM    Wt Readings from Last 3 Encounters:  09/29/19 109 lb (49.4 kg)  09/16/19 109 lb (49.4 kg)  08/25/19 114 lb (51.7 kg)     Objective:    Vital Signs:  BP 133/82   Pulse 68   Ht 5' 3"  (1.6 m)   Wt 109 lb (49.4 kg)   SpO2 96%   BMI 19.31 kg/m    CONSTITUTIONAL:  Well  nourished, well developed female in no acute distress.  EYES: anicteric MOUTH: oral mucosa is pink RESPIRATORY: Normal respiratory effort, symmetric expansion CARDIOVASCULAR: No peripheral edema SKIN: No rash, lesions or ulcers MUSCULOSKELETAL: no digital cyanosis NEURO: Cranial Nerves II-XII grossly intact, moves all extremities PSYCH: Intact judgement and insight.  A&O x 3, Mood/affect appropriate   ASSESSMENT & PLAN:    1.  OSA -  The patient is tolerating PAP therapy well without any problems. The PAP download was reviewed today and showed an AHI of 0.4/hr on 19/15 cm H2O with 97% compliance in using more than 4 hours nightly.  The patient has been using and benefiting from PAP use and will continue to benefit from therapy.  She is having a lot of problems with gas and bloating in the am.  She was supposed to be changed to auto settings but this never occurred.  I will change her to auto BiPAP with IPAP max 18cm H2o and EPAP min 5cm H2O and PS of 4cm H2O and see if that helps with the bloating.    2.  HTN -BP controlled -continue Lopressor 61m BID, spiro 23mdaily and amlodipine 2.82m9maily  3.  Restless legs syndrome -improved after starting Requip   COVID-19 Education: The signs and symptoms of COVID-19 were discussed with the patient and how to seek care for  testing (follow up with PCP or arrange E-visit).  The importance of social distancing was discussed today.  Patient Risk:   After full review of this patient's clinical status, I feel that they are at least moderate risk at this time.  Time:   Today, I have spent 20 minutes on telemedicine discussing medical problems including OSA, restless legs and HTN and reviewing patient's chart including PAP compliance download from [place].  Medication Adjustments/Labs and Tests Ordered: Current medicines are reviewed at length with the patient today.  Concerns regarding medicines are outlined above.  Tests Ordered: No orders of  the defined types were placed in this encounter.  Medication Changes: No orders of the defined types were placed in this encounter.   Disposition:  Follow up in 2 months  Signed, Fransico Him, MD  09/29/2019 10:41 AM    [place]

## 2019-09-29 NOTE — Telephone Encounter (Signed)
-----   Message from Sueanne Margarita, MD sent at 09/29/2019 10:46 AM EST ----- Please change her to auto BiPAP with IPAP max 18cm H2O and EPAP min 5cm H2O and PS of 4cm H2O and see if that helps with the bloating.  Followup with me in 2 months.

## 2019-09-29 NOTE — Telephone Encounter (Signed)
Order placed to [facility]

## 2019-10-08 NOTE — Addendum Note (Signed)
Addended by: Freada Bergeron on: 10/08/2019 04:35 PM   Modules accepted: Orders

## 2019-11-06 ENCOUNTER — Telehealth (HOSPITAL_COMMUNITY): Payer: Self-pay

## 2019-11-06 NOTE — Telephone Encounter (Signed)
Received a medical records request from [facility] for patients most recent office notes. Records were faxed to [phone].

## 2019-11-10 ENCOUNTER — Telehealth (HOSPITAL_COMMUNITY): Payer: Self-pay | Admitting: *Deleted

## 2019-11-10 NOTE — Telephone Encounter (Signed)
Cataract surgery is low risk, think ok if no recurrence of symptoms.

## 2019-11-10 NOTE — Telephone Encounter (Signed)
Patient called stating she is scheduled for cataract surgery tomorrow but woke up in the middle of the night with nausea and chest pain. This was relieved with nitro she also noticed some swelling and took a torsemide around 3am and she said swelling has gone down. Pts weight is stable and no other complaints at this time. Pt wants to know if it safe for her to move forward with cataract surgery tomorrow.   Routed to [facility] for advice

## 2019-11-10 NOTE — Telephone Encounter (Signed)
Pt aware. Pt has appt 5/4 and will call if symptoms reoccur or worsen

## 2019-11-13 ENCOUNTER — Other Ambulatory Visit: Payer: Self-pay

## 2019-11-13 ENCOUNTER — Telehealth (INDEPENDENT_AMBULATORY_CARE_PROVIDER_SITE_OTHER): Payer: Medicare PPO | Admitting: Cardiology

## 2019-11-13 ENCOUNTER — Encounter: Payer: Self-pay | Admitting: Cardiology

## 2019-11-13 ENCOUNTER — Telehealth: Payer: Self-pay | Admitting: *Deleted

## 2019-11-13 VITALS — BP 124/81 | HR 75 | Ht 63.0 in | Wt 111.0 lb

## 2019-11-13 DIAGNOSIS — I10 Essential (primary) hypertension: Secondary | ICD-10-CM

## 2019-11-13 DIAGNOSIS — G2581 Restless legs syndrome: Secondary | ICD-10-CM | POA: Diagnosis not present

## 2019-11-13 DIAGNOSIS — G4733 Obstructive sleep apnea (adult) (pediatric): Secondary | ICD-10-CM

## 2019-11-13 DIAGNOSIS — Z9989 Dependence on other enabling machines and devices: Secondary | ICD-10-CM | POA: Diagnosis not present

## 2019-11-13 NOTE — Telephone Encounter (Signed)
Order placed to [facility], 2 wk reminder made. 3 month recall visit made.

## 2019-11-13 NOTE — Telephone Encounter (Signed)
-----   Message from Sueanne Margarita, MD sent at 11/13/2019  8:54 AM EDT ----- Decrease BiPAP to 15/11cm H2O and get a download in 2 weeks.  Followup with me in 3 months

## 2019-11-13 NOTE — Progress Notes (Signed)
Virtual Visit via Telephone Note   This visit type was conducted due to national recommendations for restrictions regarding the COVID-19 Pandemic (e.g. social distancing) in an effort to limit this patient's exposure and mitigate transmission in our community.  Due to her co-morbid illnesses, this patient is at least at moderate risk for complications without adequate follow up.  This format is felt to be most appropriate for this patient at this time.  The patient did not have access to video technology/had technical difficulties with video requiring transitioning to audio format only (telephone).  All issues noted in this document were discussed and addressed.  No physical exam could be performed with this format.  Please refer to the patient's chart for her  consent to telehealth for [facility].   Evaluation Performed:  Follow-up visit  This visit type was conducted due to national recommendations for restrictions regarding the COVID-19 Pandemic (e.g. social distancing).  This format is felt to be most appropriate for this patient at this time.  All issues noted in this document were discussed and addressed.  No physical exam was performed (except for noted visual exam findings with Video Visits).  Please refer to the patient's chart (MyChart message for video visits and phone note for telephone visits) for the patient's consent to telehealth for [facility].  Date:  11/13/2019   ID:  Jerlisa, Diliberto 05-15-1951, MRN [id number]  Patient Location:  Home  Provider location:   [place]  PCP:  [name], MD  Cardiologist: [name], MD Sleep Medicine:  Fransico Him, MD Electrophysiologist:  [name], MD   Chief Complaint:  OSA  History of Present Illness:    DESHANDA MOLITOR is a 69 y.o. female who presents via audio/video conferencing for a telehealth visit today.    Andrey C Lawrenceis a 68y.o.femalewith a hx of chronic diastolic CHF, CAD and afib who  was referred for a sleep study by Dr. Aundra Dubin due to excessive daytime sleepiness. She was found to have moderate OSA with an AHI of 22/hr with significant oxygen desaturations as low as 85%. She underwent CPAP titration to 18cm H2Obut did not tolerate the high pressure and was subsequently placed on auto Pap from 6 to 14 cm H2O.  She has had problems with  restless legs and I started her on Requip 0.91m nightly.  Ferritin was normal.  Her AHI was also too high as well.  She was having problems with sleeping due to the restless legs disrupting her sleep and then getting insomnia and napping during the day.   When I saw her last she was doing better and RLS had improved with Requip.  She was still having problems with mouth dryness and running out of H2O in reservoir as well as belching air in the am.  Her pressure was adjusted and she is now back today for followup.  She is doing well with her BiPAP device and thinks that she has gotten used to it.  She tolerates the nasal cushion mask but feels the pressure is still too high and is getting too much air in her stomach.  Since going on BiPAP she feels rested in the am and has no significant daytime sleepiness.  She denies any significant mouth or nasal dryness or nasal congestion.  She does not think that he snores.    The patient does not have symptoms concerning for COVID-19 infection (fever, chills, cough, or new shortness of breath).   Prior CV studies:  The following studies were reviewed today:  PAP compliance download  Past Medical History:  Diagnosis Date  . Anxiety   . Aortic stenosis    a. bicuspid aortic valve; mean gradient of 20 mmHg in 2/10; b. s/p bioprosth AVR (19-mm Edwards pericardial valve) with a redo coronary artery bypass graft procedure in 07/2009; postoperative Dressler's syndrome    . Arthritis   . Breast cancer ([place])   . Carcinoma, renal cell ([place]) 05/2007   Laparoscopic right nephrectomy  . 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 Chronic diastolic CHF (congestive heart failure) ([place])    a. 9.2014 EchoP EF 60-65%, no rwma, bioprosth AVR, mean gradient of 69mHg, mild to mod MR, PASP 518mg.  . CKD (chronic kidney disease), stage II   . Colitis, ischemic ([place]2008  . Coronary artery disease    a. initial CABG with SVG-RCA in 12/05. b. redo SVG-RCA and bioprosthetic AVR in 1/11. d. 9/14 - PCI with DES to distal LM/proximal LAD was done rather than bypass as she would have been a poor candidate for redo sternotomy. d. S/p DES to prox LM 05/2014.  . Diabetes mellitus 03/2010    TYPE II: Hemoglobin A1c of 7.4 in  03/2010; 8.4 in 06/2010; treated with insulin pump  . Duodenal ulcer    Remote; H. pylori positive  . Gastroparesis due to DM (HCIra[place]10/04/2012  . Genetic testing 01/14/2017   Ms. Babel underwent genetic counseling and testing for hereditary cancer syndromes on 01/01/2017. Her results were negative for mutations in all 83 genes analyzed by [employer]'s 83-gene Common Hereditary Cancers Panel. Genes analyzed include: ALK, APC, ATM, AXIN2, BAP1, BARD1, BLM, BMPR1A, BRCA1, BRCA2, BRIP1, CASR, CDC73, CDH1, CDK4, CDKN1B, CDKN1C, CDKN2A, CEBPA, CHEK2, CTNNA1, DICER1, DIS3L2,   . Hodgkin's disease (H[place]1991   Mantle radiation therapy  . Hodgkin's disease ([place]  . Hyperlipidemia   . Hyperplastic gastric polyp 06/23/2012  . Hypertension   . Iron deficiency anemia    a. 03/2013 EGD: essentially normal. Possible slow GIB.  . Migraines   . NASH (nonalcoholic steatohepatitis) 1999   -biopsy in 1999  . OSA on CPAP 04/24/2017   Moderate with AHI 22/hr now on CPAP at 18cm H2o  . Osteopenia     hip on DEXA in October 2007.  . Osteoporosis   . Oxygen deficiency   . Pacemaker    six sinus syndrome  . PAF (paroxysmal atrial fibrillation) ([place]   a.  h/o brief episodes noted on ppm interrogation. b. given GI bleeding and need to be on both ASA 81 and Brilinta, risks likely outweigh benefits of  anticoagulation.   . Renal insufficiency   . Small bowel obstruction (HCC)    Recurrent; resolved after resection of a lipoma  . Syncope    a. H/o recurrent syncope with pauses on loop recorder s/p Medtronic pacemaker 07/2012.   Past Surgical History:  Procedure Laterality Date  . ABDOMINAL HYSTERECTOMY    . AORTIC VALVE REPLACEMENT  2011   Aortic valve replacement surgery with a 19 mm bioprosthetic device post redo CABG January 2011.  . Marland KitchenONE MARROW BIOPSY    . BREAST EXCISIONAL BIOPSY     benign, bilateral  . CARDIAC CATHETERIZATION    . CERVICAL BIOPSY    . COLONOSCOPY     Multiple with adenomatous polyps  . COLONOSCOPY  06/23/2012   Procedure: COLONOSCOPY;  Surgeon: CaGatha MayerMD;  Location: [facility] ENDOSCOPY;  Service: Endoscopy;  Laterality: N/A;  .  CORONARY ARTERY BYPASS GRAFT  2005, 2011   CABG-most recent SVG to RCA-12/05; RCA occluded with patent graft in 2006; Redo bypass in 07/2009  . ESOPHAGOGASTRODUODENOSCOPY    . ESOPHAGOGASTRODUODENOSCOPY  06/23/2012   Procedure: ESOPHAGOGASTRODUODENOSCOPY (EGD);  Surgeon: Gatha Mayer, MD;  Location[facility];  Service: Endoscopy;  Laterality: N/A;  . ESOPHAGOGASTRODUODENOSCOPY N/A 04/06/2013   Procedure: ESOPHAGOGASTRODUODENOSCOPY (EGD);  Surgeon: Irene Shipper, MD;  [facility] ENDOSCOPY;  Service: Endoscopy;  Laterality: N/A;  . EXPLORATORY LAPAROTOMY W/ BOWEL RESECTION     Small bowel resection of lipoma  . GIVENS CAPSULE STUDY N/A 04/30/2013   Procedure: GIVENS CAPSULE STUDY;  Surgeon: Gatha Mayer, MD;  Location: [place];  Service: Endoscopy;  Laterality: N/A;  . LEFT AND RIGHT HEART CATHETERIZATION WITH CORONARY ANGIOGRAM N/A 04/07/2013   Procedure: LEFT AND RIGHT HEART CATHETERIZATION WITH CORONARY ANGIOGRAM;  Surgeon: Larey Dresser, MD;  Location: [facility] CATH LAB;  Service: Cardiovascular;  Laterality: N/A;  . LEFT ATRIAL APPENDAGE OCCLUSION N/A 02/16/2016   Watchman not placed - nickel allergy  . LEFT HEART  CATHETERIZATION WITH CORONARY/GRAFT ANGIOGRAM N/A 06/07/2014   Procedure: LEFT HEART CATHETERIZATION WITH Beatrix Fetters;  Surgeon: Burnell Blanks, MD;  Location: [facility] CATH LAB;  Service: Cardiovascular;  Laterality: N/A;  . LIVER BIOPSY    . LOOP RECORDER IMPLANT N/A 12/10/2011   Procedure: LOOP RECORDER IMPLANT;  Surgeon: Evans Lance, MD;  Location: [facility] CATH LAB;  Service: Cardiovascular;  Laterality: N/A;  . MALONEY DILATION  06/23/2012   Procedure: [name] DILATION;  Surgeon: Gatha Mayer, MD;  Location: [facility] ENDOSCOPY;  Service: Endoscopy;;  54 fr  . MASTECTOMY Left 05/17/2015   total   . NEPHRECTOMY  2008   Laparoscopic right nephrectomy, renal cell carcinoma  . PACEMAKER INSERTION  08/21/2012   Medtronic Adapta L dual-chamber pacemaker  . PERCUTANEOUS CORONARY STENT INTERVENTION (PCI-S) N/A 04/09/2013   Procedure: PERCUTANEOUS CORONARY STENT INTERVENTION (PCI-S);  Surgeon: Burnell Blanks, MD;  Location: [facility] CATH LAB;  Service: Cardiovascular;  Laterality: N/A;  . PERMANENT PACEMAKER INSERTION N/A 08/21/2012   Procedure: PERMANENT PACEMAKER INSERTION;  Surgeon: Evans Lance, MD;  Location: [facility] CATH LAB;  Service: Cardiovascular;  Laterality: N/A;  . RIGHT HEART CATH N/A 03/18/2019   Procedure: RIGHT HEART CATH;  Surgeon: Larey Dresser, MD;  Location: [place] CV LAB;  Service: Cardiovascular;  Laterality: N/A;  . RIGHT HEART CATH AND CORONARY/GRAFT ANGIOGRAPHY N/A 04/19/2017   Procedure: RIGHT HEART CATH AND CORONARY/GRAFT ANGIOGRAPHY;  Surgeon: Larey Dresser, MD;  Location: [place] CV LAB;  Service: Cardiovascular;  Laterality: N/A;  . TEE WITHOUT CARDIOVERSION N/A 12/07/2015   Procedure: TRANSESOPHAGEAL ECHOCARDIOGRAM (TEE);  Surgeon: Larey Dresser, MD;  Location: [place];  Service: Cardiovascular;  Laterality: N/A;  . TOTAL ABDOMINAL HYSTERECTOMY W/ BILATERAL SALPINGOOPHORECTOMY  2000   endometriosis and ovarian cysts  . TOTAL MASTECTOMY Left  05/17/2015   Procedure: LEFT TOTAL MASTECTOMY;  Surgeon: Rolm Bookbinder, MD;  Location: [place];  Service: General;  Laterality: Left;  . TUMOR EXCISION  1981   removal of Hodgkins lymphoma     No outpatient medications have been marked as taking for the 11/13/19 encounter (Telemedicine) with Sueanne Margarita, MD.     Allergies:   Avandia [rosiglitazone maleate], Cephalexin, Clindamycin hcl, Humalog [insulin lispro], Lincomycin, Metformin, Novolog [insulin aspart], Penicillins, Rosiglitazone, Tizanidine, Adhesive [tape], Atorvastatin, Cholestyramine, Gentamycin [gentamicin], Iodinated diagnostic agents, Limonene, Pravastatin, Prednisone, Ranexa [ranolazine], Rosuvastatin, Simvastatin, Strawberry extract, Clopidogrel,  Codeine, Erythromycin, Hydrocodone-acetaminophen, Latex, Neomycin, Nickel, Ranitidine, Tamiflu [oseltamivir], and Tramadol   Social History   Tobacco Use  . Smoking status: Never Smoker  . Smokeless tobacco: Never Used  Substance Use Topics  . Alcohol use: No    Alcohol/week: 0.0 standard drinks  . Drug use: No     Family Hx: The patient's family history includes Breast cancer (age of onset: 37) in her sister; Cancer (age of onset: 26) in her sister; Cancer (age of onset: 1) in her maternal grandfather; Diabetes in her father; Glaucoma in her brother; Heart attack in her father, maternal grandmother, and mother; Heart disease in her father and mother; Hypertension in her brother; Lung cancer in her sister. There is no history of Colon cancer or Stomach cancer.  ROS:   Please see the history of present illness.     All other systems reviewed and are negative.   Labs/Other Tests and Data Reviewed:    Recent Labs: 04/08/2019: B Natriuretic Peptide 111.8 04/13/2019: TSH 2.141 08/10/2019: ALT 24 09/15/2019: BUN 61; Creatinine, Ser 1.83; Potassium 4.4; Sodium 137 09/16/2019: Hemoglobin 12.4; Platelets 120   Recent Lipid Panel Lab Results  Component Value Date/Time   CHOL  89 03/03/2019 11:09 AM   TRIG 147 03/03/2019 11:09 AM   HDL 38 (L) 03/03/2019 11:09 AM   CHOLHDL 2.3 03/03/2019 11:09 AM   LDLCALC 22 03/03/2019 11:09 AM   LDLDIRECT 43.0 12/14/2014 09:38 AM    Wt Readings from Last 3 Encounters:  11/13/19 111 lb (50.3 kg)  09/29/19 109 lb (49.4 kg)  09/16/19 109 lb (49.4 kg)     Objective:    Vital Signs:  BP 124/81   Pulse 75   Ht 5' 3"  (1.6 m)   Wt 111 lb (50.3 kg)   SpO2 94%   BMI 19.66 kg/m     ASSESSMENT & PLAN:    1.  OSA -  The patient is tolerating PAP therapy but is still having problems with swallowing air and bloating in the am. The PAP download was reviewed today and showed an AHI of 1.8/hr on 17/13 cm H2O with 97% compliance in using more than 4 hours nightly.  The patient has been using and benefiting from PAP use and will continue to benefit from therapy. -I will decrease BiPAP to 15/11cm H2O -I will get a download in 2 weeks -she will add a chin strap  2.  HTN -BP controlled -continue Lopressor 84m BID, spiro 259mdaily and Amlodipine 2.56m74maily  3.  Restless Legs syndrome -significantly improved on Requip -she has gastroparesis and thinks that it does not take effect soon enough before bed so she will try taking it earlier and if that does not work we will increase the dose.  -continue Requip  COVID-19 Education: The signs and symptoms of COVID-19 were discussed with the patient and how to seek care for testing (follow up with PCP or arrange E-visit).  The importance of social distancing was discussed today.  Patient Risk:   After full review of this patient's clinical status, I feel that they are at least moderate risk at this time.  Time:   Today, I have spent 20 minutes on telemedicine discussing medical problems including OSA< HTN, RLS and reviewing patient's chart including PAP compliance download.  Medication Adjustments/Labs and Tests Ordered: Current medicines are reviewed at length with the patient  today.  Concerns regarding medicines are outlined above.  Tests Ordered: No orders of the defined types were placed  in this encounter.  Medication Changes: No orders of the defined types were placed in this encounter.   Disposition:  Follow up in 1 year(s)  Signed, Fransico Him, MD  11/13/2019 8:43 AM    [facility]

## 2019-11-16 ENCOUNTER — Inpatient Hospital Stay (HOSPITAL_COMMUNITY): Payer: Medicare PPO | Attending: Hematology

## 2019-11-16 ENCOUNTER — Other Ambulatory Visit: Payer: Self-pay

## 2019-11-16 DIAGNOSIS — D696 Thrombocytopenia, unspecified: Secondary | ICD-10-CM | POA: Diagnosis present

## 2019-11-16 DIAGNOSIS — D649 Anemia, unspecified: Secondary | ICD-10-CM

## 2019-11-16 LAB — COMPREHENSIVE METABOLIC PANEL
ALT: 24 U/L (ref 0–44)
AST: 26 U/L (ref 15–41)
Albumin: 4.7 g/dL (ref 3.5–5.0)
Alkaline Phosphatase: 173 U/L — ABNORMAL HIGH (ref 38–126)
Anion gap: 12 (ref 5–15)
BUN: 62 mg/dL — ABNORMAL HIGH (ref 8–23)
CO2: 27 mmol/L (ref 22–32)
Calcium: 10.1 mg/dL (ref 8.9–10.3)
Chloride: 95 mmol/L — ABNORMAL LOW (ref 98–111)
Creatinine, Ser: 1.72 mg/dL — ABNORMAL HIGH (ref 0.44–1.00)
GFR calc Af Amer: 35 mL/min — ABNORMAL LOW (ref >60)
GFR calc non Af Amer: 30 mL/min — ABNORMAL LOW (ref >60)
Glucose, Bld: 210 mg/dL — ABNORMAL HIGH (ref 70–99)
Potassium: 4.7 mmol/L (ref 3.5–5.1)
Sodium: 134 mmol/L — ABNORMAL LOW (ref 135–145)
Total Bilirubin: 0.8 mg/dL (ref 0.3–1.2)
Total Protein: 8.9 g/dL — ABNORMAL HIGH (ref 6.5–8.1)

## 2019-11-16 LAB — CBC WITH DIFFERENTIAL/PLATELET
Abs Immature Granulocytes: 0.06 10*3/uL (ref 0.00–0.07)
Basophils Absolute: 0.1 10*3/uL (ref 0.0–0.1)
Basophils Relative: 1 %
Eosinophils Absolute: 0.2 10*3/uL (ref 0.0–0.5)
Eosinophils Relative: 2 %
HCT: 40.1 % (ref 36.0–46.0)
Hemoglobin: 12.9 g/dL (ref 12.0–15.0)
Immature Granulocytes: 1 %
Lymphocytes Relative: 16 %
Lymphs Abs: 1.6 10*3/uL (ref 0.7–4.0)
MCH: 29.5 pg (ref 26.0–34.0)
MCHC: 32.2 g/dL (ref 30.0–36.0)
MCV: 91.6 fL (ref 80.0–100.0)
Monocytes Absolute: 0.7 10*3/uL (ref 0.1–1.0)
Monocytes Relative: 7 %
Neutro Abs: 7.3 10*3/uL (ref 1.7–7.7)
Neutrophils Relative %: 73 %
Platelets: 143 10*3/uL — ABNORMAL LOW (ref 150–400)
RBC: 4.38 MIL/uL (ref 3.87–5.11)
RDW: 14 % (ref 11.5–15.5)
WBC: 9.9 10*3/uL (ref 4.0–10.5)
nRBC: 0 % (ref 0.0–0.2)

## 2019-11-16 LAB — IRON AND TIBC
Iron: 86 ug/dL (ref 28–170)
Saturation Ratios: 24 % (ref 10.4–31.8)
TIBC: 365 ug/dL (ref 250–450)
UIBC: 279 ug/dL

## 2019-11-16 LAB — FERRITIN: Ferritin: 157 ng/mL (ref 11–307)

## 2019-11-16 LAB — LACTATE DEHYDROGENASE: LDH: 156 U/L (ref 98–192)

## 2019-11-23 ENCOUNTER — Encounter (HOSPITAL_COMMUNITY): Payer: Self-pay | Admitting: Hematology

## 2019-11-23 ENCOUNTER — Inpatient Hospital Stay (HOSPITAL_COMMUNITY): Payer: Medicare PPO | Attending: Hematology | Admitting: Hematology

## 2019-11-23 ENCOUNTER — Other Ambulatory Visit: Payer: Self-pay

## 2019-11-23 VITALS — BP 132/49 | HR 71 | Temp 97.3°F | Resp 18 | Wt 113.9 lb

## 2019-11-23 DIAGNOSIS — D696 Thrombocytopenia, unspecified: Secondary | ICD-10-CM | POA: Insufficient documentation

## 2019-11-23 DIAGNOSIS — Z79899 Other long term (current) drug therapy: Secondary | ICD-10-CM | POA: Insufficient documentation

## 2019-11-23 DIAGNOSIS — G478 Other sleep disorders: Secondary | ICD-10-CM | POA: Insufficient documentation

## 2019-11-23 DIAGNOSIS — Z950 Presence of cardiac pacemaker: Secondary | ICD-10-CM | POA: Insufficient documentation

## 2019-11-23 DIAGNOSIS — F329 Major depressive disorder, single episode, unspecified: Secondary | ICD-10-CM | POA: Insufficient documentation

## 2019-11-23 DIAGNOSIS — I480 Paroxysmal atrial fibrillation: Secondary | ICD-10-CM | POA: Diagnosis not present

## 2019-11-23 DIAGNOSIS — R0602 Shortness of breath: Secondary | ICD-10-CM | POA: Insufficient documentation

## 2019-11-23 DIAGNOSIS — K5900 Constipation, unspecified: Secondary | ICD-10-CM | POA: Insufficient documentation

## 2019-11-23 DIAGNOSIS — Z794 Long term (current) use of insulin: Secondary | ICD-10-CM | POA: Insufficient documentation

## 2019-11-23 DIAGNOSIS — Z7982 Long term (current) use of aspirin: Secondary | ICD-10-CM | POA: Insufficient documentation

## 2019-11-23 NOTE — Patient Instructions (Addendum)
[facility] at [facility] Discharge Instructions  You were seen today by Dr. Katragadda. He went over your recent lab results. He will see you back in 4 months for labs and follow up.   Thank you for choosing [facility] at [facility] to provide your oncology and hematology care.  To afford each patient quality time with our provider, please arrive at least 15 minutes before your scheduled appointment time.   If you have a lab appointment with the Cancer Center please come in thru the  Main Entrance and check in at the main information desk  You need to re-schedule your appointment should you arrive 10 or more minutes late.  We strive to give you quality time with our providers, and arriving late affects you and other patients whose appointments are after yours.  Also, if you no show three or more times for appointments you may be dismissed from the clinic at the providers discretion.     Again, thank you for choosing [facility].  Our hope is that these requests will decrease the amount of time that you wait before being seen by our physicians.       _____________________________________________________________  Should you have questions after your visit to [facility], please contact our office at [phone] between the hours of 8:00 a.m. and 4:30 p.m.  Voicemails left after 4:00 p.m. will not be returned until the following business day.  For prescription refill requests, have your pharmacy contact our office and allow 72 hours.    Cancer Center Support Programs:   > Cancer Support Group  2nd Tuesday of the month 1pm-2pm, Journey Room    

## 2019-11-23 NOTE — Assessment & Plan Note (Signed)
1.  Mild thrombocytopenia: -CT of the abdomen did not show any splenomegaly.  Previous CT disorder work-up was negative. -Peripheral blood smear showed giant platelets.  Clinically consistent with immune mediated thrombocytopenia. -Latest platelet count is 142.  No bleeding or easy bruising reported.  2.  Mild hypercalcemia: -Her calcium level for the last 3 times in our clinic was normal to upper limit of normal. -Previous vitamin D was 75.  Intact PTH was normal.  Nonparathyroid hypercalcemia.  Calcitriol was 16.5.  PTH RP was less than 2. -SPEP and immunofixation was negative.  Free light chain ratio was elevated. -We will consider repeating myeloma labs at next visit in 4 months.  3.  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 -Last mammogram on 08/04/2019 reviewed by me was BI-RADS Category 4 with 0.6 cm mixed echogenicity mass in the right breast at 9:00. -Aspiration of this 0.6 cm complex cyst was successful on 08/24/2019.  Aspiration of the 0.4 cm cyst in the right breast 9:00 was successful.  Routine annual mammogram was recommended.  4.  Stage IIb Hodgkin's disease: -She received mantle radiation in 1981.  Continues to be in remission since then.  Does not have any B symptoms.  5.  Chromophobe renal cell carcinoma: -Nephrectomy in 2008.  Last CT in September 2019 showed stable 14 mm left renal lesion.

## 2019-11-23 NOTE — Progress Notes (Signed)
Medicine [name] [name] [name], [name] [name] [id number]   CLINIC:  Medical Oncology/Hematology  PCP:  [name] [name], [place] [address] [phone]   REASON FOR VISIT: Follow-up for thrombocytopenia and mild hypercalcemia.  CURRENT THERAPY: observation   INTERVAL HISTORY:  [name] [name] 69 y.o. female seen for follow-up of mild thrombocytopenia and hypercalcemia which is intermittent.  Appetite is 75%.  Energy levels are 50%.  Denies any easy bruising or bleeding.  Burning in the hands and feet has been stable.  Shortness of breath on exertion is also stable.  Occasional palpitations from atrial fibrillation.  Trouble swallowing solids and pills occasionally.  Chronic constipation is also stable.    REVIEW OF SYSTEMS:  Review of Systems  HENT:   Positive for trouble swallowing.   Respiratory: Positive for shortness of breath.   Gastrointestinal: Positive for constipation.  Neurological: Positive for numbness.  Psychiatric/Behavioral: Positive for depression and sleep disturbance.  All other systems reviewed and are negative.    PAST MEDICAL/SURGICAL HISTORY:  Past Medical History:  Diagnosis Date  . Anxiety   . Aortic stenosis    a. bicuspid aortic valve; mean gradient of 20 mmHg in 2/10; b. s/p bioprosth AVR (19-mm Edwards pericardial valve) with a redo coronary artery bypass graft procedure in 07/2009; postoperative Dressler's syndrome    . Arthritis   . Breast cancer ([place])   . Carcinoma, renal cell ([place]) 05/2007   Laparoscopic right nephrectomy  . 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 Chronic diastolic CHF (congestive heart failure) ([place])    a. 9.2014 EchoP EF 60-65%, no rwma, bioprosth AVR, mean gradient of 29mHg, mild to mod MR, PASP 510mg.  . CKD (chronic kidney disease), stage II   . Colitis, ischemic ([facility]  . Coronary artery disease    a. initial CABG with SVG-RCA in 12/05. b. redo SVG-RCA and bioprosthetic AVR  in 1/11. d. 9/14 - PCI with DES to distal LM/proximal LAD was done rather than bypass as she would have been a poor candidate for redo sternotomy. d. S/p DES to prox LM 05/2014.  . Diabetes mellitus 03/2010    TYPE II: Hemoglobin A1c of 7.4 in  03/2010; 8.4 in 06/2010; treated with insulin pump  . Duodenal ulcer    Remote; H. pylori positive  . Gastroparesis due to DM ([facility]10/04/2012  . Genetic testing 01/14/2017   [name] underwent genetic counseling and testing for hereditary cancer syndromes on 01/01/2017. Her results were negative for mutations in all 83 genes analyzed by [facility]'s 83-gene Common Hereditary Cancers Panel. Genes analyzed include: ALK, APC, ATM, AXIN2, BAP1, BARD1, BLM, BMPR1A, BRCA1, BRCA2, BRIP1, CASR, CDC73, CDH1, CDK4, CDKN1B, CDKN1C, CDKN2A, CEBPA, CHEK2, CTNNA1, DICER1, DIS3L2,   . Hodgkin's disease ([facility]1991   Mantle radiation therapy  . Hodgkin's disease ([facility]  . Hyperlipidemia   . Hyperplastic gastric polyp 06/23/2012  . Hypertension   . Iron deficiency anemia    a. 03/2013 EGD: essentially normal. Possible slow GIB.  . Migraines   . NASH (nonalcoholic steatohepatitis) 1999   -biopsy in 1999  . OSA on CPAP 04/24/2017   Moderate with AHI 22/hr now on CPAP at 18cm H2o  . Osteopenia     hip on DEXA in October 2007.  . Osteoporosis   . Oxygen deficiency   . Pacemaker    six sinus syndrome  . PAF (paroxysmal atrial fibrillation) (HCC)    a.  h/o brief episodes  noted on ppm interrogation. b. given GI bleeding and need to be on both ASA 81 and Brilinta, risks likely outweigh benefits of anticoagulation.   . Renal insufficiency   . Small bowel obstruction (HCC)    Recurrent; resolved after resection of a lipoma  . Syncope    a. H/o recurrent syncope with pauses on loop recorder s/p Medtronic pacemaker 07/2012.   Past Surgical History:  Procedure Laterality Date  . ABDOMINAL HYSTERECTOMY    . AORTIC VALVE REPLACEMENT  2011   Aortic valve replacement surgery  with a 19 mm bioprosthetic device post redo CABG January 2011.  Marland Kitchen BONE MARROW BIOPSY    . BREAST EXCISIONAL BIOPSY     benign, bilateral  . CARDIAC CATHETERIZATION    . CERVICAL BIOPSY    . COLONOSCOPY     Multiple with adenomatous polyps  . COLONOSCOPY  06/23/2012   Procedure: COLONOSCOPY;  Surgeon: [name] Mayer, MD;  Location: [facility];  Service: Endoscopy;  Laterality: N/A;  . CORONARY ARTERY BYPASS GRAFT  2005, 2011   CABG-most recent SVG to RCA-12/05; RCA occluded with patent graft in 2006; Redo bypass in 07/2009  . ESOPHAGOGASTRODUODENOSCOPY    . ESOPHAGOGASTRODUODENOSCOPY  06/23/2012   Procedure: ESOPHAGOGASTRODUODENOSCOPY (EGD);  Surgeon: [name] Mayer, MD;  Location: [facility];  Service: Endoscopy;  Laterality: N/A;  . ESOPHAGOGASTRODUODENOSCOPY N/A 04/06/2013   Procedure: ESOPHAGOGASTRODUODENOSCOPY (EGD);  Surgeon: [name] Shipper, MD;  Location[facility] ENDOSCOPY;  Service: Endoscopy;  Laterality: N/A;  . EXPLORATORY LAPAROTOMY W/ BOWEL RESECTION     Small bowel resection of lipoma  . GIVENS CAPSULE STUDY N/A 04/30/2013   Procedure: GIVENS CAPSULE STUDY;  Surgeon: [name] Mayer, MD;  Location: [place];  Service: Endoscopy;  Laterality: N/A;  . LEFT AND RIGHT HEART CATHETERIZATION WITH CORONARY ANGIOGRAM N/A 04/07/2013   Procedure: LEFT AND RIGHT HEART CATHETERIZATION WITH CORONARY ANGIOGRAM;  Surgeon: [name] Dresser, MD;  Location: [facility] CATH LAB;  Service: Cardiovascular;  Laterality: N/A;  . LEFT ATRIAL APPENDAGE OCCLUSION N/A 02/16/2016   Watchman not placed - nickel allergy  . LEFT HEART CATHETERIZATION WITH CORONARY/GRAFT ANGIOGRAM N/A 06/07/2014   Procedure: LEFT HEART CATHETERIZATION WITH [name] [name];  Surgeon: [name] Blanks, MD;  Location: [facility] CATH LAB;  Service: Cardiovascular;  Laterality: N/A;  . LIVER BIOPSY    . LOOP RECORDER IMPLANT N/A 12/10/2011   Procedure: LOOP RECORDER IMPLANT;  Surgeon: [name] Lance, MD;  Location: [facility] CATH LAB;   Service: Cardiovascular;  Laterality: N/A;  . MALONEY DILATION  06/23/2012   Procedure: [name] [name] DILATION;  Surgeon: [name] Mayer, MD;  Location: [facility] ENDOSCOPY;  Service: Endoscopy;;  54 fr  . MASTECTOMY Left 05/17/2015   total   . NEPHRECTOMY  2008   Laparoscopic right nephrectomy, renal cell carcinoma  . PACEMAKER INSERTION  08/21/2012   Medtronic Adapta L dual-chamber pacemaker  . PERCUTANEOUS CORONARY STENT INTERVENTION (PCI-S) N/A 04/09/2013   Procedure: PERCUTANEOUS CORONARY STENT INTERVENTION (PCI-S);  Surgeon: [name] Blanks, MD;  Location: [facility] CATH LAB;  Service: Cardiovascular;  Laterality: N/A;  . PERMANENT PACEMAKER INSERTION N/A 08/21/2012   Procedure: PERMANENT PACEMAKER INSERTION;  Surgeon: [name] Lance, MD;  Location: [facility] CATH LAB;  Service: Cardiovascular;  Laterality: N/A;  . RIGHT HEART CATH N/A 03/18/2019   Procedure: RIGHT HEART CATH;  Surgeon: [name] Dresser, MD;  Location: [place] CV LAB;  Service: Cardiovascular;  Laterality: N/A;  . RIGHT HEART CATH AND CORONARY/GRAFT ANGIOGRAPHY N/A 04/19/2017   Procedure: RIGHT  HEART CATH AND CORONARY/GRAFT ANGIOGRAPHY;  Surgeon: [name] Dresser, MD;  Location: [place] CV LAB;  Service: Cardiovascular;  Laterality: N/A;  . TEE WITHOUT CARDIOVERSION N/A 12/07/2015   Procedure: TRANSESOPHAGEAL ECHOCARDIOGRAM (TEE);  Surgeon: [name] Dresser, MD;  Location: [place];  Service: Cardiovascular;  Laterality: N/A;  . TOTAL ABDOMINAL HYSTERECTOMY W/ BILATERAL SALPINGOOPHORECTOMY  2000   endometriosis and ovarian cysts  . TOTAL MASTECTOMY Left 05/17/2015   Procedure: LEFT TOTAL MASTECTOMY;  Surgeon: Rolm Bookbinder, MD;  Location: [place];  Service: General;  Laterality: Left;  . TUMOR EXCISION  1981   removal of Hodgkins lymphoma     SOCIAL HISTORY:  Social History   Socioeconomic History  . Marital status: Married    Spouse name: Not on file  . Number of children: 2  . Years of education: Not on file  . Highest  education level: Not on file  Occupational History  . Occupation: retired    Comment: elementary principal  Tobacco Use  . Smoking status: Never Smoker  . Smokeless tobacco: Never Used  Substance and Sexual Activity  . Alcohol use: No    Alcohol/week: 0.0 standard drinks  . Drug use: No  . Sexual activity: Yes  Other Topics Concern  . Not on file  Social History Narrative   Retired [employer] principal, married    Started disability September 2013      Has one daughter in [place], works at [employer]. Has a son, Aaron Edelman, who is Retail buyer.   Eats all food groups.   Wear seatbelt.    Attends church. [place], [place]. [employer].    Social Determinants of Health   Financial Resource Strain:   . Difficulty of Paying Living Expenses:   Food Insecurity:   . Worried About Charity fundraiser in the Last Year:   . Arboriculturist in the Last Year:   Transportation Needs:   . Film/video editor (Medical):   Marland Kitchen Lack of Transportation (Non-Medical):   Physical Activity:   . Days of Exercise per Week:   . Minutes of Exercise per Session:   Stress:   . Feeling of Stress :   Social Connections:   . Frequency of Communication with Friends and Family:   . Frequency of Social Gatherings with Friends and Family:   . Attends Religious Services:   . Active Member of Clubs or Organizations:   . Attends Archivist Meetings:   Marland Kitchen Marital Status:   Intimate Partner Violence:   . Fear of Current or Ex-Partner:   . Emotionally Abused:   Marland Kitchen Physically Abused:   . Sexually Abused:     FAMILY HISTORY:  Family History  Problem Relation Age of Onset  . Heart attack Mother        d.61  . Heart disease Mother   . Heart attack Father        d.60  . Diabetes Father   . Heart disease Father   . Lung cancer Sister        d.61 metastases to brain. History of smoking. Maternal half-sister.  . Cancer Sister 30       unspecified gynecologic cancer (either cervical or ovarian) in her  87s.  Marland Kitchen Heart attack Maternal Grandmother   . Cancer Maternal Grandfather 61       d.52s oral cancer  . Hypertension Brother   . Glaucoma Brother   . Breast cancer Sister 42  d.42 from metastatic disease. Paternal half-sister.  . Colon cancer Neg Hx   . Stomach cancer Neg Hx     CURRENT MEDICATIONS:  Outpatient Encounter Medications as of 11/23/2019  Medication Sig  . ALPRAZolam (XANAX) 0.5 MG tablet Take 1 tablet (0.5 mg total) by mouth 2 (two) times daily as needed for anxiety.  Marland Kitchen amLODipine (NORVASC) 2.5 MG tablet TAKE ONE (1) TABLET BY MOUTH EVERY DAY  . APIDRA 100 UNIT/ML injection Inject 40 Units into the skin as directed. INFUSE THROUGH INSULIN PUMP UTD, [place] depends on carb intake  . aspirin EC 81 MG tablet Take 81 mg by mouth at bedtime.  Marland Kitchen BRILINTA 60 MG TABS tablet TAKE ONE TABLET (60MG TOTAL) BY MOUTH TWICE DAILY  . Coenzyme Q10 (CO Q 10) 100 MG CAPS Take 100 mg by mouth at bedtime.   . CONTOUR NEXT TEST test strip USE TO TEST BLOOD SUGAR UP TO EIGHT TIMES A DAY  . docusate sodium (COLACE) 100 MG capsule Take 100 mg by mouth 2 (two) times daily.   . Evolocumab (REPATHA SURECLICK) 262 MG/ML SOAJ Inject 140 mg into the skin every 14 (fourteen) days.  . famotidine (PEPCID) 40 MG tablet Take 40 mg by mouth daily.  . isosorbide mononitrate (IMDUR) 120 MG 24 hr tablet TAKE ONE TABLET (120MG TOTAL) BY MOUTH DAILY  . latanoprost (XALATAN) 0.005 % ophthalmic solution Place 1 drop into both eyes at bedtime.  Marland Kitchen MAGNESIUM CITRATE PO Take 250 mg by mouth daily.  . metoprolol tartrate (LOPRESSOR) 50 MG tablet TAKE ONE AND ONE-HALF TABLETS (75MG TOTAL) BY MOUTH TWICE DAILY  . pravastatin (PRAVACHOL) 10 MG tablet TAKE ONE TABLET (10MG TOTAL) BY MOUTH DAILY  . prednisoLONE acetate (PRED FORTE) 1 % ophthalmic suspension 1 drop 4 (four) times daily.  Marland Kitchen rOPINIRole (REQUIP) 0.25 MG tablet Take 1 tablet (0.25 mg total) by mouth daily. Take two hours prior to bedtime every night  .  spironolactone (ALDACTONE) 25 MG tablet Take 12.5 mg by mouth daily.  Marland Kitchen torsemide (DEMADEX) 20 MG tablet Take by mouth. Take 2 tablets every AM and half tablet every PM  . ULORIC 40 MG tablet Take 1 tablet (40 mg total) by mouth at bedtime.  Marland Kitchen UNABLE TO FIND Med Name: So Clean CPAP cleaning system (Patient taking differently: Med Name: BiPap)  . VASCEPA 1 g CAPS TAKE 2 CAPSULES (2 GRAMS TOTAL) BY MOUTHTWO TIMES DAILY  . Wheat Dextrin (BENEFIBER) POWD Take 1 Dose by mouth daily.  Marland Kitchen acyclovir ointment (ZOVIRAX) 5 % Apply 1 application topically every 3 (three) hours as needed (fever blister/cold sores).  . clobetasol (OLUX) 0.05 % topical foam Apply 1 application topically 2 (two) times daily as needed (skin flaking).  . colchicine 0.6 MG tablet Take 0.6 mg by mouth daily as needed (gout flare).  . diclofenac Sodium (VOLTAREN) 1 % GEL as needed.  Marland Kitchen glucagon (GLUCAGON EMERGENCY) 1 MG injection Inject 1 mg into the vein once as needed (low blood sugar).   Marland Kitchen loratadine (CLARITIN) 10 MG tablet Take 10 mg by mouth daily as needed for allergies.  . metolazone (ZAROXOLYN) 2.5 MG tablet Take 2.5 mg (1 tab) once as directed by CHF clinic. Call before taking [phone] (Patient not taking: Reported on 11/23/2019)  . moxifloxacin (VIGAMOX) 0.5 % ophthalmic solution Apply 1 drop to eye 4 (four) times daily.  . mupirocin cream (BACTROBAN) 2 % Apply 1 application topically 2 (two) times daily as needed. (Patient not taking: Reported on 11/23/2019)  .  nitroGLYCERIN (NITROLINGUAL) 0.4 MG/SPRAY spray PLACE 1 SPRAY UNDER THE TONGUE EVERY 5 MINUTES FOR 3 DOSES AS NEEDED FOR CHEST PAIN. (Patient not taking: Reported on 11/23/2019)  . Polyethyl Glycol-Propyl Glycol (SYSTANE) 0.4-0.3 % SOLN Apply 1 drop to eye daily as needed (dry eyes).  . silver nitrate applicators 38-18 % applicator Use as needed to stop superficial skin bleeding. (Patient not taking: Reported on 11/23/2019)   No facility-administered encounter medications  on file as of 11/23/2019.    ALLERGIES:  Allergies  Allergen Reactions  . Avandia [Rosiglitazone Maleate] Other (See Comments)    Congestive heart failure  . Cephalexin Anaphylaxis, Swelling and Rash    Extremities swell , throat swelling   . Clindamycin Hcl Anaphylaxis and Shortness Of Breath  . Humalog [Insulin Lispro] Itching     big knots and whelp   . Lincomycin Anaphylaxis and Shortness Of Breath  . Metformin Other (See Comments)    Congestive heart failure  . Novolog [Insulin Aspart] Hives and Itching    Humalog & Novolog big knots and whelp    . Penicillins Anaphylaxis, Hives, Swelling and Rash    Did it involve swelling of the face/tongue/throat, SOB, or low BP? Yes Did it involve sudden or severe rash/hives, skin peeling, or any reaction on the inside of your mouth or nose? No Did you need to seek medical attention at a hospital or doctor's office? Yes When did it last happen?Childhood allergy  If all above answers are "NO", may proceed with cephalosporin use.   Marland Kitchen Rosiglitazone Other (See Comments)    Congestive heart failure  . Tizanidine Other (See Comments)    Dizziness, Mental Status Changes, Hallucination    . Adhesive [Tape] Other (See Comments)    Tears skin, Please use "paper" tape  . Atorvastatin Other (See Comments)    increased LFT's, no muscle weakness, leg pain Muscle and joint pain increased LFT's  Increased LFTs  . Cholestyramine Other (See Comments)    Liver Disorder Elevated LFTs    . Gentamycin [Gentamicin] Hives, Itching and Rash    Fine red bumps.  Reaction noted post loop recorder implant, where gentamycin was used for irrigation. Topical prep  . Iodinated Diagnostic Agents Other (See Comments)    PAIN DURING LYMPHANGIOGRAM '81, NO ALLERGY TO IV CONTRAST.Has had procedures with Iodine since without problems.   . Limonene Hives, Itching, Rash and Other (See Comments)    Reaction noted post loop recorder implant, where gentamycin  was used for irrigation. Topical prep  . Pravastatin Other (See Comments)    In high doses (20-22m) causes muscle and joint pain  Per pt. "can only take in low doses".   . Prednisone Itching and Other (See Comments)    B/P went high, itching all over.  Tolerates with benadryl   . Ranexa [Ranolazine] Other (See Comments)    Nausea, dizziness, low blood sugar, tingling in right hand, "blood blisters" on tongue   . Rosuvastatin Nausea And Vomiting, Nausea Only and Other (See Comments)    Muscle and joint pain & increased LFTs; tolerates Pravastatin 15m(max)  . Simvastatin Other (See Comments)    Increased LFT's   . Strawberry Extract Hives, Itching, Rash and Other (See Comments)    Reaction noted post loop recorder implant, where gentamycin was used for irrigation. Topical prep.  Can eat fresh strawberries   . Clopidogrel Other (See Comments)    Does not work for patient Medicine was not effective  . Codeine Nausea And Vomiting  .  Erythromycin Nausea And Vomiting  . Hydrocodone-Acetaminophen Itching, Rash and Other (See Comments)    itching  . Latex Itching and Other (See Comments)    I"if wear gloves hands get red ,itchy no prioblem if other wear and touch me"  . Neomycin Rash and Other (See Comments)    Redness  . Nickel Itching  . Ranitidine Nausea Only  . Tamiflu [Oseltamivir] Nausea And Vomiting  . Tramadol Nausea Only     PHYSICAL EXAM:  ECOG Performance status: 1  Vitals:   11/23/19 1137  BP: (!) 132/49  Pulse: 71  Resp: 18  Temp: (!) 97.3 F (36.3 C)  SpO2: 100%   Filed Weights   11/23/19 1137  Weight: 113 lb 14.4 oz (51.7 kg)    Physical Exam Constitutional:      Appearance: Normal appearance. She is normal weight.  Cardiovascular:     Rate and Rhythm: Normal rate and regular rhythm.     Heart sounds: Normal heart sounds.  Pulmonary:     Effort: Pulmonary effort is normal.     Breath sounds: Normal breath sounds.  Abdominal:     General: Bowel  sounds are normal.     Palpations: Abdomen is soft.  Musculoskeletal:        General: Normal range of motion.  Skin:    General: Skin is warm and dry.  Neurological:     Mental Status: She is alert and oriented to person, place, and time. Mental status is at baseline.  Psychiatric:        Mood and Affect: Mood normal.        Behavior: Behavior normal.        Thought Content: Thought content normal.        Judgment: Judgment normal.      LABORATORY DATA:  I have reviewed the labs as listed.  CBC    Component Value Date/Time   WBC 9.9 11/16/2019 1304   RBC 4.38 11/16/2019 1304   HGB 12.9 11/16/2019 1304   HGB 12.4 09/16/2019 1533   HGB 13.4 12/13/2016 1427   HCT 40.1 11/16/2019 1304   HCT 38.4 09/16/2019 1533   HCT 40.3 12/13/2016 1427   PLT 143 (L) 11/16/2019 1304   PLT 120 (L) 09/16/2019 1533   MCV 91.6 11/16/2019 1304   MCV 92 09/16/2019 1533   MCV 88.8 12/13/2016 1427   MCH 29.5 11/16/2019 1304   MCHC 32.2 11/16/2019 1304   RDW 14.0 11/16/2019 1304   RDW 13.2 09/16/2019 1533   RDW 14.6 (H) 12/13/2016 1427   LYMPHSABS 1.6 11/16/2019 1304   LYMPHSABS 1.2 12/13/2016 1427   MONOABS 0.7 11/16/2019 1304   MONOABS 0.8 12/13/2016 1427   EOSABS 0.2 11/16/2019 1304   EOSABS 0.2 12/13/2016 1427   BASOSABS 0.1 11/16/2019 1304   BASOSABS 0.1 12/13/2016 1427   CMP Latest Ref Rng & Units 11/16/2019 09/15/2019 09/04/2019  Glucose 70 - 99 mg/dL 210(H) 175(H) 157(H)  BUN 8 - 23 mg/dL 62(H) 61(H) 74(H)  Creatinine 0.44 - 1.00 mg/dL 1.72(H) 1.83(H) 1.95(H)  Sodium 135 - 145 mmol/L 134(L) 137 137  Potassium 3.5 - 5.1 mmol/L 4.7 4.4 3.9  Chloride 98 - 111 mmol/L 95(L) 99 100  CO2 22 - 32 mmol/L 27 27 28   Calcium 8.9 - 10.3 mg/dL 10.1 10.2 10.0  Total Protein 6.5 - 8.1 g/dL 8.9(H) - -  Total Bilirubin 0.3 - 1.2 mg/dL 0.8 - -  Alkaline Phos 38 - 126 U/L 173(H) - -  AST 15 - 41 U/L 26 - -  ALT 0 - 44 U/L 24 - -       DIAGNOSTIC IMAGING:  I have reviewed  scans.     ASSESSMENT & PLAN:   Thrombocytopenia ([place]) 1.  Mild thrombocytopenia: -CT of the abdomen did not show any splenomegaly.  Previous CT disorder work-up was negative. -Peripheral blood smear showed giant platelets.  Clinically consistent with immune mediated thrombocytopenia. -Latest platelet count is 142.  No bleeding or easy bruising reported.  2.  Mild hypercalcemia: -Her calcium level for the last 3 times in our clinic was normal to upper limit of normal. -Previous vitamin D was 75.  Intact PTH was normal.  Nonparathyroid hypercalcemia.  Calcitriol was 16.5.  PTH RP was less than 2. -SPEP and immunofixation was negative.  Free light chain ratio was elevated. -We will consider repeating myeloma labs at next visit in 4 months.  3.  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 -Last mammogram on 08/04/2019 reviewed by me was BI-RADS Category 4 with 0.6 cm mixed echogenicity mass in the right breast at 9:00. -Aspiration of this 0.6 cm complex cyst was successful on 08/24/2019.  Aspiration of the 0.4 cm cyst in the right breast 9:00 was successful.  Routine annual mammogram was recommended.  4.  Stage IIb Hodgkin's disease: -She received mantle radiation in 1981.  Continues to be in remission since then.  Does not have any B symptoms.  5.  Chromophobe renal cell carcinoma: -Nephrectomy in 2008.  Last CT in September 2019 showed stable 14 mm left renal lesion.    Orders placed this encounter:  Orders Placed This Encounter  Procedures  . CBC with Differential/Platelet  . Comprehensive metabolic panel  . Protein electrophoresis, serum  . Kappa/lambda light chains  . Lactate dehydrogenase  . Immunofixation electrophoresis      Derek Jack, MD [place] [phone]

## 2019-11-24 ENCOUNTER — Encounter (HOSPITAL_COMMUNITY): Payer: Self-pay | Admitting: Cardiology

## 2019-11-24 ENCOUNTER — Telehealth (HOSPITAL_COMMUNITY): Payer: Self-pay | Admitting: *Deleted

## 2019-11-24 ENCOUNTER — Ambulatory Visit (HOSPITAL_COMMUNITY)
Admission: RE | Admit: 2019-11-24 | Discharge: 2019-11-24 | Disposition: A | Discharge status: Home / Self Care | Payer: Medicare PPO | Source: Ambulatory Visit | Attending: Cardiology | Admitting: Cardiology

## 2019-11-24 VITALS — BP 114/52 | HR 70 | Wt 114.2 lb

## 2019-11-24 DIAGNOSIS — I130 Hypertensive heart and chronic kidney disease with heart failure and stage 1 through stage 4 chronic kidney disease, or unspecified chronic kidney disease: Secondary | ICD-10-CM | POA: Diagnosis not present

## 2019-11-24 DIAGNOSIS — Z85528 Personal history of other malignant neoplasm of kidney: Secondary | ICD-10-CM | POA: Insufficient documentation

## 2019-11-24 DIAGNOSIS — Z833 Family history of diabetes mellitus: Secondary | ICD-10-CM | POA: Insufficient documentation

## 2019-11-24 DIAGNOSIS — Q231 Congenital insufficiency of aortic valve: Secondary | ICD-10-CM | POA: Diagnosis not present

## 2019-11-24 DIAGNOSIS — D696 Thrombocytopenia, unspecified: Secondary | ICD-10-CM | POA: Insufficient documentation

## 2019-11-24 DIAGNOSIS — Z794 Long term (current) use of insulin: Secondary | ICD-10-CM | POA: Insufficient documentation

## 2019-11-24 DIAGNOSIS — Z9641 Presence of insulin pump (external) (internal): Secondary | ICD-10-CM | POA: Insufficient documentation

## 2019-11-24 DIAGNOSIS — N184 Chronic kidney disease, stage 4 (severe): Secondary | ICD-10-CM | POA: Diagnosis not present

## 2019-11-24 DIAGNOSIS — Z8249 Family history of ischemic heart disease and other diseases of the circulatory system: Secondary | ICD-10-CM | POA: Insufficient documentation

## 2019-11-24 DIAGNOSIS — Z885 Allergy status to narcotic agent status: Secondary | ICD-10-CM | POA: Insufficient documentation

## 2019-11-24 DIAGNOSIS — Z8673 Personal history of transient ischemic attack (TIA), and cerebral infarction without residual deficits: Secondary | ICD-10-CM | POA: Diagnosis not present

## 2019-11-24 DIAGNOSIS — G4733 Obstructive sleep apnea (adult) (pediatric): Secondary | ICD-10-CM | POA: Insufficient documentation

## 2019-11-24 DIAGNOSIS — Z79899 Other long term (current) drug therapy: Secondary | ICD-10-CM | POA: Insufficient documentation

## 2019-11-24 DIAGNOSIS — I25110 Atherosclerotic heart disease of native coronary artery with unstable angina pectoris: Secondary | ICD-10-CM | POA: Insufficient documentation

## 2019-11-24 DIAGNOSIS — I050 Rheumatic mitral stenosis: Secondary | ICD-10-CM | POA: Insufficient documentation

## 2019-11-24 DIAGNOSIS — Z7902 Long term (current) use of antithrombotics/antiplatelets: Secondary | ICD-10-CM | POA: Insufficient documentation

## 2019-11-24 DIAGNOSIS — Z955 Presence of coronary angioplasty implant and graft: Secondary | ICD-10-CM | POA: Insufficient documentation

## 2019-11-24 DIAGNOSIS — Z888 Allergy status to other drugs, medicaments and biological substances status: Secondary | ICD-10-CM | POA: Insufficient documentation

## 2019-11-24 DIAGNOSIS — E1043 Type 1 diabetes mellitus with diabetic autonomic (poly)neuropathy: Secondary | ICD-10-CM | POA: Insufficient documentation

## 2019-11-24 DIAGNOSIS — E785 Hyperlipidemia, unspecified: Secondary | ICD-10-CM | POA: Insufficient documentation

## 2019-11-24 DIAGNOSIS — Z8571 Personal history of Hodgkin lymphoma: Secondary | ICD-10-CM | POA: Insufficient documentation

## 2019-11-24 DIAGNOSIS — Z951 Presence of aortocoronary bypass graft: Secondary | ICD-10-CM | POA: Diagnosis not present

## 2019-11-24 DIAGNOSIS — Z952 Presence of prosthetic heart valve: Secondary | ICD-10-CM | POA: Insufficient documentation

## 2019-11-24 DIAGNOSIS — I2510 Atherosclerotic heart disease of native coronary artery without angina pectoris: Secondary | ICD-10-CM | POA: Diagnosis not present

## 2019-11-24 DIAGNOSIS — I480 Paroxysmal atrial fibrillation: Secondary | ICD-10-CM | POA: Diagnosis not present

## 2019-11-24 DIAGNOSIS — I5032 Chronic diastolic (congestive) heart failure: Secondary | ICD-10-CM

## 2019-11-24 DIAGNOSIS — E1022 Type 1 diabetes mellitus with diabetic chronic kidney disease: Secondary | ICD-10-CM | POA: Insufficient documentation

## 2019-11-24 DIAGNOSIS — Z880 Allergy status to penicillin: Secondary | ICD-10-CM | POA: Insufficient documentation

## 2019-11-24 DIAGNOSIS — Z950 Presence of cardiac pacemaker: Secondary | ICD-10-CM | POA: Insufficient documentation

## 2019-11-24 DIAGNOSIS — I425 Other restrictive cardiomyopathy: Secondary | ICD-10-CM | POA: Insufficient documentation

## 2019-11-24 DIAGNOSIS — D509 Iron deficiency anemia, unspecified: Secondary | ICD-10-CM | POA: Insufficient documentation

## 2019-11-24 DIAGNOSIS — Z905 Acquired absence of kidney: Secondary | ICD-10-CM | POA: Insufficient documentation

## 2019-11-24 DIAGNOSIS — I495 Sick sinus syndrome: Secondary | ICD-10-CM | POA: Diagnosis not present

## 2019-11-24 DIAGNOSIS — Z881 Allergy status to other antibiotic agents status: Secondary | ICD-10-CM | POA: Insufficient documentation

## 2019-11-24 DIAGNOSIS — Z923 Personal history of irradiation: Secondary | ICD-10-CM | POA: Insufficient documentation

## 2019-11-24 DIAGNOSIS — Z7982 Long term (current) use of aspirin: Secondary | ICD-10-CM | POA: Insufficient documentation

## 2019-11-24 MED ORDER — TORSEMIDE 20 MG PO TABS: ORAL_TABLET | ORAL | 5 refills | Status: DC

## 2019-11-24 MED ORDER — TORSEMIDE 20 MG PO TABS: ORAL_TABLET | ORAL | 3 refills | Status: DC

## 2019-11-24 NOTE — Patient Instructions (Addendum)
INCREASE Torsemide to 34m (2 tabs) in the morning and 223m(1 tab) in the evening   Lab appointment in 10 days. Friday May 14th, 2021 at 10:00AM Garage code [id number] We will only contact you if something comes back abnormal or we need to make some changes. Otherwise no news is good news!   Your physician recommends that you schedule a follow-up appointment in: 3 months with Dr McAundra Dubin 3 month appointment: Wednesday August 4th, 2021 at 12pm Garage code [id number]   Please call office at [phone] 2 if you have any questions or concerns.   At [facility] and your health needs are our priority. As part of our continuing mission to provide you with exceptional heart care, we have created designated Provider Care Teams. These Care Teams include your primary Cardiologist (physician) and Advanced Practice Providers (APPs- Physician Assistants and Nurse Practitioners) who all work together to provide you with the care you need, when you need it.   You may see any of the following providers on your designated Care Team at your next follow up: . Marland Kitchenr DaGlori Bickers Dr DaLoralie Champagne AmDarrick GrinderNP . BrLyda JesterPA . LaAudry RilesPharmD   Please be sure to bring in all your medications bottles to every appointment.

## 2019-11-24 NOTE — Progress Notes (Signed)
Date:  11/24/2019   ID:  Madora, Barletta 06/06/1951, MRN [id number]   Provider location: [address] [place] Type of Visit: Established patient  PCP:  [name], MD  Cardiologist:  No primary care provider on file. Primary HF: Dr. Aundra Dubin   History of Present Illness: [name] [name] is a 69 y.o. female who has a history of CAD s/p CABG in 12/05 and redo in 1/11, bioprosthetic AVR, diastolic CHF, and sick sinus syndrome s/p PCM presents for cardiology followup.  She had initial CABG with SVG-RCA in 12/05 followed by redo SVG-RCA and bioprosthetic AVR in 1/11.  She has had diastolic CHF and is on Lasix.  In 9/14, she had a CPX test.  This showed severe functional limitation with ischemic ECG changes (inferolateral ST depression and ST elevation in V1 and V2). She had chest pain.  She had R/LHC in 9/14.  This showed elevated left and right heart filling pressures, patent SVG-PDA, and 80% distal LM/80% ostial LAD stenosis.  She was not a candidate for redo CABG (had 2 prior sternotomies as well as chest radiation for Hodgkins lymphoma).  She had DES from left main into proximal LAD and will need long-term DAPT => Brilinta was used as she has been a poor responder to Plavix in the past.  She re-developed angina in 11/15 and was admitted with unstable angina.  LHC showed 80% ostial LM and patent SVG-RCA. She had DES to ostial LM. Echo in 1/18 showed EF 60-65%, normal bioprosthetic aortic valve, mild-moderate MR, PASP 35 mmHg.   She has chronic iron deficiency anemia thought to be due to a slow GI bleed.  Last EGD was unremarkable and a capsule endoscopy was also unremarkable.  She has had periodic transfusions.    She had bilateral mastectomy for DCIS in 17/51 without complication.  She has an insulin pump followed by an endocrinologist at [facility].   Given myalgias with statins, she was started on Praluent.  LDL is now excellent.    In the past she has had short runs  of atrial fibrillation.  She is not anticoagulated. Planned for Watchman placement, but given her nickel allergy, we were unable to place a Watchman.     Diagnosed with OSA, now on CPAP.   Given ongoing problems with exertional dyspnea and occasional chest pain, Cardiolite was repeated in 9/18 and showed inferior changes.  She underwent left/right heart cath in 9/18.  This showed elevated filling pressures, R>L.  There was 75+% ostial LCx stenosis.  However, there were 2 layers of stent from the mid left main into the ostial LAD, and LCx intervention was thought to be very risky.  It was decided to manage her medically.  Echo was repeated in 10/18 showing EF 55-60%, moderate to severe TR.    Given recurrent chest pain, she had ETT-Cardiolite done in 3/19.  This was a normal study with no evidence for ischemia or infarction.  Echo in 1/20 showed EF 55-60%, mild LVH, stable bioprosthetic aortic valve, moderate-severe TR.    Recently, she has been more short of breath with exertion and renal function has been worse.  She was set up for echo and RHC.  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  RHC in 8/20 showed primarily  right-sided HF.  Cardiolite in 8/20 showed no ischemia or infarction.  Torsemide was increased and she cut back on sodium in her diet.    CPX in 11/20 showed severe functional limitation from HF.   She saw Dr. Mosetta Pigeon at [facility] for evaluation for heart/kidney transplant (for restrictive cardiomyopathy).  She was determined to be a poor surgical candidate due to extensive vascular calcifications.   She returns today for followup of CHF and CAD.  Weight stable. Using Bipap.  She is able to walk 1-1.5 miles on her treadmill then gets short of breath. Riding a  tricycle bike outside some now.  Symptoms are very up and down, some days she has a lot of energy and some days she does not.   Today is a good day.  For about 2 wks prior to 4/16, she was taking a NTG every day for chest heaviness (walking fast or walking up a hill).  However, since 4/16, she has had no chest pain and has not taken NTG.  No clinical change that she knows of.   ECG (personally reviewed): NSR, long 1st degree AVB 404 msec, LVH  [employer] device interrogation: 6% A-paced, 0.3% V-paced  Labs (2/19): hgb 13.2 Labs @ [facility] (3/19): K 4.7, creatinine 1.4, ANA negative, RF negative Labs (4/19): K 3.9, creatinine 1.52 => 1.63 Labs (6/19): LDL 8, HDL 42, TGs 259 Labs (9/19): K 4.9, creatinine 1.65, hgb 13.4, plts 129, LDL 46,TGs 182 Labs (12/19): K 4.9, creatinine 2.07, hgb 12.4 Labs (3/20): K 4, creatinine 1.8 Labs (6/20): K 4.7, creatinine 1.47 Labs (8/20): creatinine 2.13 => 2.46 => 2.4, LDL 22 Labs (9/20): K 4.8, creatinine 2.74, AST/ALT normal, hgb 12.6 Labs (1/21): K 4.1, creatinine 1.86, hgb 11.7 Labs (4/21): K 4.7, creatinine 1.7  PMH: 1. Sick sinus syndrome s/p [employer] PCM.  2. HTN 3. H/o SBO 4. Renal cell carcinoma s/p right nephrectomy in 2008.  5. 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  Fibroscan in 11/14 showed advanced fibrosis concerning for cirrhosis. EGD in 11/14 showed no varices.  6. Diastolic CHF: Echo (2/45) with EF 60-65%, bioprosthetic aortic valve with mean gradient 15 mmHg, mild MR.  CPX (9/14): peak VO2 10.4, VE/VCO2 43.8, inferolateral ST depression and ST elevation in V1/V2 with severe dyspnea and chest pain, severe functional limitation with ischemic changes.  Echo (9/14) with EF 60-65%, normal RV size and systolic function, mild-moderate MR, bioprosthetic aortic valve normal, PA systolic pressure 51 mmHg, no evidence for pericardial constriction though exam incomplete for constriction.  Echo (11/15) with EF 55-60%, bioprosthetic aortic valve,  mild-moderate MR, moderate TR, PA systolic pressure 42 mmHg.  - TEE (5/17) with EF 60-65%, normal bioprosthetic aortic valve with mean gradient 13 mmHg, normal RV size and systolic function. - Echo (1/18) with EF 60-65%, normal bioprosthetic aortic valve, mild-moderate MR, PASP 35 mmHg.  - Echo (10/18) with EF 55-60%, bioprosthetic aortic valve mean gradient 10 mmHg, MAC with mild mitral stenosis (mean gradient 7) and mild mitral regurgitation, moderate to severe TR, PASP 43 mmHg.  - RHC (9/18): mean RA 13, PA 44/18 mean 33, mean PCWP 19, CI 3.45, PVR 2.7 WU.  - Echo (1/20): EF 55-60%, mild LVH, mild MR, stable bioprosthetic aortic valve, moderate-severe TR.  - RHC (8/20): mean RA 14, PA 43/15 mean 28, mean PCWP 13, PAPI 2, PVR 4.2, CI 2.31 - Echo (8/20): EF 55-60%, RV mildly dilated with normal systolic function, mildly D-shaped septum suggestive of RV pressure/volume overload, rheumatic-appearing mitral valve  with mild mitral stenosis (mean gradient 6, MVA by PHT 4 cm^2), no evidence for pericardial constriction, moderate TR, bioprosthetic aortic valve functioning normally with mean gradient 6 mmHg.  - CPX (11/20): peak VO2 9.8, VE/VCO2 slope 38, RER 1.11 => severe functional limitation from HF.   7. TAH-BSO 8. Type I diabetes: Has insulin pump.  9. H/o ischemic colitis. 10. H/o PUD. 11. Diabetic gastroparesis. 12. Hyperlipidemia: Myalgias with Crestor and atorvastatin, elevated LFTs with simvastatin.  Myalgias with > 10 mg pravastatin. Now on Praluent.  13. CKD stage IV: Sees Dr. Lorrene Reid 14. Bicuspid aortic valve s/p 19 mm Edwards pericardial valve in 1/11 (had post-operative Dresslers syndrome).   15. CAD: CABG with SVG-RCA in 12/05.  Redo SVG-RCA in 1/11 with AVR.  LHC/RHC (9/14) with mean RA 18, PA 62/25 mean 43, mean PCWP 26, CI 2.8, 70-80% distal LM stenosis, 80% ostial LAD stenosis, total occlusion RCA, SVG-PDA patent.  Patient had DES LM into LAD.  Plan to continue long-term ASA/Brilinta  (poor responder to Plavix).  ETT-cardiolite (4/15) with 6' exercise, EF 60%, ST depression in recovery and mild chest pain, no ischemia or infarction by perfusion images.  Unstable angina 11/15 with 80% LM, patent SVG-RCA => DES to ostial LM.   - Lexiscan Cardiolite (2/18): EF 62%, no ischemia or infarction (normal).  She had a significant reaction to [place] and probably should not get again.  - Cardiolite (9/18): EF 72%, prior inferior infarct with mild peri-infarct ischemia.  - LHC (9/18): 2 layers stent left main - ostial LAD with 50% dLM in-stent restenosis, 75+% ostial LCx, totally occluded RCA, SVG-RCA patent.  Medical management planned.  - ETT-Cardiolite (4/19): EF normal, 7'11" exercise, no ischemia or infarction.  - Lexiscan Cardiolite (8/20): No ischemia/infarction.  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  17. Anemia: Iron-deficiency.  Suspect chronic GI blood loss.  EGD in 9/14 was unremarkable.  Capsule endoscopy 10/14 was unremarkable.  Has required periodic blood transfusions.  18. Atrial fibrillation: Paroxysmal, noted by [facility] interrogation.  19. Carotid stenosis: Carotid dopplers (2/15) with 40-59% bilateral stenosis. Carotid dopplers (3/16) with 40-59% bilateral ICA stenosis. Carotid dopplers (3/17) with 40-59% bilateral ICA stenosis.  - Carotid dopplers (3/18) with mild BICA stenosis.  20. 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  22. C difficile colitis 2017 23. Gout 24. ?TIA: head CT negative.  25. OSA: Moderate, uses CPAP.  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 Hypercalcemia 28. 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  29. Tricuspid regurgitation: Moderate by 8/20 echo.   Current Outpatient Medications  Medication Sig Dispense Refill  . acyclovir ointment (ZOVIRAX) 5 % Apply 1  application topically every 3 (three) hours as needed (fever blister/cold sores).    . ALPRAZolam (XANAX) 0.5 MG tablet Take 1 tablet (0.5 mg total) by mouth 2 (two) times daily as needed for anxiety. 60 tablet 1  . amLODipine (NORVASC) 2.5 MG tablet TAKE ONE (1) TABLET BY MOUTH EVERY DAY 90 tablet 1  . APIDRA 100 UNIT/ML injection Inject 40 Units into the skin as directed. INFUSE THROUGH INSULIN PUMP UTD, Bolus depends on carb intake  2  . aspirin EC 81 MG tablet Take 81 mg by mouth at bedtime.    Marland Kitchen BRILINTA 60 MG TABS tablet TAKE ONE TABLET (60MG TOTAL) BY MOUTH TWICE DAILY 180 tablet 3  . clobetasol (OLUX) 0.05 %  topical foam Apply 1 application topically 2 (two) times daily as needed (skin flaking).    . Coenzyme Q10 (CO Q 10) 100 MG CAPS Take 100 mg by mouth at bedtime.     . colchicine 0.6 MG tablet Take 0.6 mg by mouth daily as needed (gout flare).    . CONTOUR NEXT TEST test strip USE TO TEST BLOOD SUGAR UP TO EIGHT TIMES A DAY    . diclofenac Sodium (VOLTAREN) 1 % GEL as needed.    . docusate sodium (COLACE) 100 MG capsule Take 100 mg by mouth 2 (two) times daily.     . Evolocumab (REPATHA SURECLICK) 638 MG/ML SOAJ Inject 140 mg into the skin every 14 (fourteen) days. 2 pen 11  . famotidine (PEPCID) 40 MG tablet Take 40 mg by mouth daily.    Marland Kitchen glucagon (GLUCAGON EMERGENCY) 1 MG injection Inject 1 mg into the vein once as needed (low blood sugar).     . isosorbide mononitrate (IMDUR) 120 MG 24 hr tablet TAKE ONE TABLET (120MG TOTAL) BY MOUTH DAILY 90 tablet 3  . latanoprost (XALATAN) 0.005 % ophthalmic solution Place 1 drop into both eyes at bedtime.    Marland Kitchen loratadine (CLARITIN) 10 MG tablet Take 10 mg by mouth daily as needed for allergies.    Marland Kitchen MAGNESIUM CITRATE PO Take 250 mg by mouth daily.    . metolazone (ZAROXOLYN) 2.5 MG tablet Take 2.5 mg (1 tab) once as directed by CHF clinic. Call before taking [phone] 3 tablet 0  . metoprolol tartrate (LOPRESSOR) 50 MG tablet TAKE ONE AND  ONE-HALF TABLETS (75MG TOTAL) BY MOUTH TWICE DAILY 270 tablet 2  . moxifloxacin (VIGAMOX) 0.5 % ophthalmic solution Apply 1 drop to eye 4 (four) times daily.    . mupirocin cream (BACTROBAN) 2 % Apply 1 application topically 2 (two) times daily as needed. 90 g 0  . nitroGLYCERIN (NITROLINGUAL) 0.4 MG/SPRAY spray PLACE 1 SPRAY UNDER THE TONGUE EVERY 5 MINUTES FOR 3 DOSES AS NEEDED FOR CHEST PAIN. 14.7 g 0  . Polyethyl Glycol-Propyl Glycol (SYSTANE) 0.4-0.3 % SOLN Apply 1 drop to eye daily as needed (dry eyes).    . pravastatin (PRAVACHOL) 10 MG tablet TAKE ONE TABLET (10MG TOTAL) BY MOUTH DAILY 90 tablet 3  . prednisoLONE acetate (PRED FORTE) 1 % ophthalmic suspension 1 drop 4 (four) times daily.    Marland Kitchen rOPINIRole (REQUIP) 0.25 MG tablet Take 1 tablet (0.25 mg total) by mouth daily. Take two hours prior to bedtime every night 90 tablet 3  . silver nitrate applicators 17-79 % applicator Use as needed to stop superficial skin bleeding. 10 each 0  . spironolactone (ALDACTONE) 25 MG tablet Take 12.5 mg by mouth daily.    Marland Kitchen torsemide (DEMADEX) 20 MG tablet Take 2 tablets (40 mg total) by mouth every morning AND 1 tablet (20 mg total) every evening. 90 tablet 5  . ULORIC 40 MG tablet Take 1 tablet (40 mg total) by mouth at bedtime. 90 tablet 3  . UNABLE TO FIND Med Name: So Clean CPAP cleaning system (Patient taking differently: Med Name: BiPap) 1 each 0  . VASCEPA 1 g CAPS TAKE 2 CAPSULES (2 GRAMS TOTAL) BY MOUTHTWO TIMES DAILY 360 capsule 3  . Wheat Dextrin (BENEFIBER) POWD Take 1 Dose by mouth daily.     No current facility-administered medications for this encounter.    Allergies:   Avandia [rosiglitazone maleate], Cephalexin, Clindamycin hcl, Humalog [insulin lispro], Lincomycin, Metformin, Novolog [insulin aspart], Penicillins, Rosiglitazone,  Tizanidine, Adhesive [tape], Atorvastatin, Cholestyramine, Gentamycin [gentamicin], Iodinated diagnostic agents, Limonene, Pravastatin, Prednisone, Ranexa  [ranolazine], Rosuvastatin, Simvastatin, Strawberry extract, Clopidogrel, Codeine, Erythromycin, Hydrocodone-acetaminophen, Latex, Neomycin, Nickel, Ranitidine, Tamiflu [oseltamivir], and Tramadol   Social History:  The patient  reports that she has never smoked. She has never used smokeless tobacco. She reports that she does not drink alcohol or use drugs.   Family History:  The patient's family history includes Breast cancer (age of onset: 26) in her sister; Cancer (age of onset: 5) in her sister; Cancer (age of onset: 60) in her maternal grandfather; Diabetes in her father; Glaucoma in her brother; Heart attack in her father, maternal grandmother, and mother; Heart disease in her father and mother; Hypertension in her brother; Lung cancer in her sister.   ROS:  Please see the history of present illness.   All other systems are personally reviewed and negative.   Exam:   BP (!) 114/52   Pulse 70   Wt 51.8 kg (114 lb 3.2 oz)   SpO2 100%   BMI 20.23 kg/m  General: NAD Neck: JVP 8-9 cm, no thyromegaly or thyroid nodule.  Lungs: Clear to auscultation bilaterally with normal respiratory effort. CV: Nondisplaced PMI.  Heart regular S1/S2, no S3/S4, no murmur.  No peripheral edema.  No carotid bruit.  Normal pedal pulses.  Abdomen: Soft, nontender, no hepatosplenomegaly, no distention.  Skin: Intact without lesions or rashes.  Neurologic: Alert and oriented x 3.  Psych: Normal affect. Extremities: No clubbing or cyanosis.  HEENT: Normal.    Recent Labs: 04/08/2019: B Natriuretic Peptide 111.8 04/13/2019: TSH 2.141 11/16/2019: ALT 24; BUN 62; Creatinine, Ser 1.72; Hemoglobin 12.9; Platelets 143; Potassium 4.7; Sodium 134  Personally reviewed   Wt Readings from Last 3 Encounters:  11/24/19 51.8 kg (114 lb 3.2 oz)  11/23/19 51.7 kg (113 lb 14.4 oz)  11/13/19 50.3 kg (111 lb)    ASSESSMENT AND PLAN:  1. CAD: s/p SVG-RCA in 12/05, then redo SVG-RCA in 1/11-->distal left main/proximal  LAD severe stenosis in 9/14. PCI with DES to distal LM/proximal LAD was done rather than bypass as she would have been a poor candidate for redo sternotomy (2 prior sternotomies and history of chest wall radiation for Hodgkins disease). Recurrent unstable angina with 80% ostial LM treated with repeat DES in 11/15.  Lexiscan Cardiolite in 9/18 inferior changes.  Repeat LHC (9/18) showed 50% distal left main in-stent restenosis and 75+% ostial LCx stenosis. I reviewed the films at the time with interventional cardiology => would be very difficult to intervene on the ostial LCx through 2 overlapping stents from left main into the LAD.  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 Given no ACS, we planned medical management and increased Imdur.  Cardiolite in 3/19 and again in 8/20 showed no ischemia or infarction.  For a two week period, she was having daily CP with exertion and taking NTG, but has had no CP since 4/16 and has not taken any more NTG.  Would hold off on cath with renal disease and also given findings on prior cath that could lead to angina but were not amenable to intervention.  - Continue Brilinta 60 mg bid (poor responder to plavix) long-term given recurrent left main disease.  - Continue ASA 81, statin/Praluent/Vascepa.  - Continue Imdur 120 mg daily . - Continue metoprolol 75 mg bid.  Would not increase with long 1st degree AVB.  - She was unable to tolerate ranolazine.  -  Continue amlodipine 2.5 mg daily.  2. Bioprosthetic aortic valve replacement: In setting of bicuspid aortic valve, thoracic aorta not dilated on 2017 TEE. Valve looked ok on 8/20 echo.  3. Chronic diastolic CHF: Restrictive cardiomyopathy.  Most recent echo in 8/20 showed normal LV systolic function, mildly dilated RV with normal systolic function, mild MS, moderate TR, normal bioprosthetic AoV, no evidence for pericardial constriction. RHC in 8/20 was concerning for right > left heart  failure, she did appear to have equalization of diastolic pressures concerning for restrictive cardiomyopathy, possibly related to prior radiation. CPX in 11/20 showed severe HF limitation.  Balance of renal failure and RV failure has been difficult.  Creatinine actually somewhat better recently.  She is mildly volume overloaded on exam today though not markedly. NYHA class II-III, stable.  Symptoms wax and wane. Weight stable.   - Increase torsemide back to 40 qam/20 qpm with BMET in 10 days (had been taking torsemide 40 qam/10 qpm.   - Continue spironolactone 12.5 mg daily.  - I had her evaluated at [facility] for heart/kidney transplant for CKD and restrictive cardiomyopathy, but extensive vascular calcification makes her a poor surgical candidate.  4. CKD: Patient has a single kidney s/p right nephrectomy and suspected diabetic nephropathy. CKD stage 4, slowly progressive.  Creatinine better recently, 1.86 was last check. I have been concerned for cardiorenal syndrome in the setting of RV > LV failure/restrictive cardiomyopathy.   - Not candidate for PD given prior abdominal surgery.  - If CKD progresses, she may be a candidate for home HD.  5. Hyperlipidemia: She has not been able to tolerate any higher potency statin than pravastatin 10 mg daily. Good lipids in 8/20.  - Continue pravastatin.  - Continue Repatha.   - She is on Vascepa for elevated TGs.   6. Anemia: Chronic/slow GI bleed. Unremarkable EGD and capsule endoscopy in fall 2014. Unfortunately, needs to stay on Brilinta long-term (Plavix non-responder). Rectal bleeding earlier this year thought to be diverticular, has now resolved.  Has followup with GI.  Stable hgb recently.   7. Atrial fibrillation: Paroxysmal, brief episodes noted on PCM interrogation. Given GI bleeding and need to be on Brilinta, risks have been thought to outweigh benefits of anticoagulation.  She is not a good Multaq candidate with CHF. If she needs an  antiarrhythmic, consider Tikosyn or Sotalol but renal function may make these meds difficult to use.  No recent palpitations.  - We were unable to place Watchman device due to nickel allergy.  8. Carotid bruit: Only mild stenosis on last dopplers in 3/18.  9. SSS: Pacemaker in place.  10. HTN: controlled. Continue current regimen.  11. OSA: Moderate, using Bipap.   12. ?TIA: Transient dysarthria in 3/18, could have been afib-related TIA. No recurrent symptoms.  13. Thrombocytopenia: Chronic and mild. Follows with hematology.  14. 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  Based on 8/20 RHC and echo it appears mild.  15. Tricuspid regurgitation: Moderate on 8/20 echo.   Followup in 3 months.   Signed, [name], MD  11/24/2019  [facility] [address] [place] [address] [phone] (office) [phone] (fax)

## 2019-11-24 NOTE — Telephone Encounter (Signed)
pts pharmacy called requesting 90 day supply of torsemide. Refill sent.

## 2019-11-26 LAB — CUP PACEART REMOTE DEVICE CHECK
Battery Impedance: 431 Ohm
Battery Remaining Longevity: 116 mo
Battery Voltage: 2.8 V
Brady Statistic AP VP Percent: 0 %
Brady Statistic AP VS Percent: 6 %
Brady Statistic AS VP Percent: 0 %
Brady Statistic AS VS Percent: 93 %
Date Time Interrogation Session: 20210504093048
Implantable Lead Implant Date: 20140130
Implantable Lead Implant Date: 20140130
Implantable Lead Location: 753859
Implantable Lead Location: 753860
Implantable Lead Model: 5076
Implantable Lead Model: 5076
Implantable Pulse Generator Implant Date: 20140130
Lead Channel Impedance Value: 448 Ohm
Lead Channel Impedance Value: 550 Ohm
Lead Channel Pacing Threshold Amplitude: 0.375 V
Lead Channel Pacing Threshold Amplitude: 0.625 V
Lead Channel Pacing Threshold Pulse Width: 0.4 ms
Lead Channel Pacing Threshold Pulse Width: 0.4 ms
Lead Channel Setting Pacing Amplitude: 1.5 V
Lead Channel Setting Pacing Amplitude: 2.5 V
Lead Channel Setting Pacing Pulse Width: 0.4 ms
Lead Channel Setting Sensing Sensitivity: 5.6 mV

## 2019-11-27 ENCOUNTER — Telehealth (HOSPITAL_COMMUNITY): Payer: Self-pay

## 2019-11-27 NOTE — Telephone Encounter (Signed)
Received a fax from [facility] for patients last ov notes. Notes were faxed to [phone] and a conformation fax was received. Both items will be scanned into patients chart

## 2019-11-30 ENCOUNTER — Ambulatory Visit (INDEPENDENT_AMBULATORY_CARE_PROVIDER_SITE_OTHER): Payer: Medicare PPO | Admitting: *Deleted

## 2019-11-30 ENCOUNTER — Telehealth: Payer: Medicare PPO | Admitting: Cardiology

## 2019-11-30 DIAGNOSIS — I495 Sick sinus syndrome: Secondary | ICD-10-CM

## 2019-12-01 NOTE — Progress Notes (Signed)
Remote pacemaker transmission.   

## 2019-12-03 ENCOUNTER — Other Ambulatory Visit (HOSPITAL_COMMUNITY): Payer: Self-pay | Admitting: Cardiology

## 2019-12-04 ENCOUNTER — Other Ambulatory Visit (HOSPITAL_COMMUNITY): Payer: Medicare PPO

## 2019-12-07 ENCOUNTER — Other Ambulatory Visit (HOSPITAL_COMMUNITY): Payer: Medicare PPO

## 2019-12-07 ENCOUNTER — Ambulatory Visit (HOSPITAL_COMMUNITY)
Admission: RE | Admit: 2019-12-07 | Discharge: 2019-12-07 | Disposition: A | Discharge status: Home / Self Care | Payer: Medicare PPO | Source: Ambulatory Visit | Attending: Internal Medicine | Admitting: Internal Medicine

## 2019-12-07 ENCOUNTER — Other Ambulatory Visit: Payer: Self-pay

## 2019-12-07 DIAGNOSIS — I5032 Chronic diastolic (congestive) heart failure: Secondary | ICD-10-CM | POA: Insufficient documentation

## 2019-12-07 LAB — BASIC METABOLIC PANEL
Anion gap: 12 (ref 5–15)
BUN: 63 mg/dL — ABNORMAL HIGH (ref 8–23)
CO2: 27 mmol/L (ref 22–32)
Calcium: 10 mg/dL (ref 8.9–10.3)
Chloride: 98 mmol/L (ref 98–111)
Creatinine, Ser: 1.96 mg/dL — ABNORMAL HIGH (ref 0.44–1.00)
GFR calc Af Amer: 30 mL/min — ABNORMAL LOW (ref >60)
GFR calc non Af Amer: 26 mL/min — ABNORMAL LOW (ref >60)
Glucose, Bld: 222 mg/dL — ABNORMAL HIGH (ref 70–99)
Potassium: 3.7 mmol/L (ref 3.5–5.1)
Sodium: 137 mmol/L (ref 135–145)

## 2019-12-15 ENCOUNTER — Encounter (HOSPITAL_COMMUNITY): Payer: Self-pay

## 2019-12-16 ENCOUNTER — Other Ambulatory Visit: Payer: Self-pay

## 2019-12-16 ENCOUNTER — Telehealth (HOSPITAL_COMMUNITY): Payer: Self-pay

## 2019-12-16 DIAGNOSIS — I5032 Chronic diastolic (congestive) heart failure: Secondary | ICD-10-CM

## 2019-12-16 MED ORDER — METOPROLOL TARTRATE 50 MG PO TABS: ORAL_TABLET | ORAL | 2 refills | Status: DC

## 2019-12-16 NOTE — Telephone Encounter (Signed)
-----   Message from Larey Dresser, MD sent at 12/16/2019  8:00 AM EDT ----- Get BMET in 1 week to follow creatinine

## 2019-12-16 NOTE — Telephone Encounter (Signed)
Per MD pt scheduled for BMET on 6/2

## 2019-12-16 NOTE — Telephone Encounter (Signed)
refilled lopressor

## 2019-12-23 ENCOUNTER — Ambulatory Visit (HOSPITAL_COMMUNITY)
Admission: RE | Admit: 2019-12-23 | Discharge: 2019-12-23 | Disposition: A | Discharge status: Home / Self Care | Payer: Medicare PPO | Source: Ambulatory Visit | Attending: Cardiology | Admitting: Cardiology

## 2019-12-23 ENCOUNTER — Other Ambulatory Visit: Payer: Self-pay

## 2019-12-23 ENCOUNTER — Encounter (HOSPITAL_COMMUNITY): Payer: Self-pay

## 2019-12-23 DIAGNOSIS — I5032 Chronic diastolic (congestive) heart failure: Secondary | ICD-10-CM | POA: Diagnosis present

## 2019-12-23 LAB — BASIC METABOLIC PANEL
Anion gap: 12 (ref 5–15)
BUN: 68 mg/dL — ABNORMAL HIGH (ref 8–23)
CO2: 29 mmol/L (ref 22–32)
Calcium: 10 mg/dL (ref 8.9–10.3)
Chloride: 97 mmol/L — ABNORMAL LOW (ref 98–111)
Creatinine, Ser: 2.19 mg/dL — ABNORMAL HIGH (ref 0.44–1.00)
GFR calc Af Amer: 26 mL/min — ABNORMAL LOW (ref >60)
GFR calc non Af Amer: 23 mL/min — ABNORMAL LOW (ref >60)
Glucose, Bld: 272 mg/dL — ABNORMAL HIGH (ref 70–99)
Potassium: 4.3 mmol/L (ref 3.5–5.1)
Sodium: 138 mmol/L (ref 135–145)

## 2019-12-24 ENCOUNTER — Telehealth (HOSPITAL_COMMUNITY): Payer: Self-pay

## 2019-12-24 DIAGNOSIS — I5032 Chronic diastolic (congestive) heart failure: Secondary | ICD-10-CM

## 2019-12-24 MED ORDER — TORSEMIDE 20 MG PO TABS: ORAL_TABLET | ORAL | 3 refills | Status: DC

## 2019-12-24 NOTE — Telephone Encounter (Signed)
Patient advised and verbalized understanding. Lab appt scheduled for 6/11 due to patient being out of town on the 14th. Med list was updated to reflect changes. Lab order entered.   Orders Placed This Encounter  Procedures  . Basic Metabolic Panel (BMET)    Standing Status:   Future    Standing Expiration Date:   12/23/2020    Order Specific Question:   Release to patient    Answer:   Immediate   Meds ordered this encounter  Medications  . torsemide (DEMADEX) 20 MG tablet    Sig: Take 2 tablets by mouth every morning and 1 tablet by mouth every other evening.    Dispense:  270 tablet    Refill:  3    Please cancel all previous orders for current medication. Change in dosage or pill size.

## 2019-12-24 NOTE — Telephone Encounter (Signed)
-----   Message from Larey Dresser, MD sent at 12/24/2019 12:28 AM EDT ----- Hold torsemide for a day, then decrease afternoon torsemide to 20 mg every other afternoon.  BMET 10 days.

## 2019-12-25 ENCOUNTER — Telehealth (HOSPITAL_COMMUNITY): Payer: Self-pay

## 2019-12-25 MED ORDER — TORSEMIDE 20 MG PO TABS: 40.0000 mg | ORAL_TABLET | Freq: Every day | ORAL | 3 refills | Status: DC

## 2019-12-25 NOTE — Telephone Encounter (Signed)
Patient advised and verbalized understanding. Med list updated to reflect changes. Lab appt already scheduled

## 2019-12-25 NOTE — Telephone Encounter (Signed)
-----   Message from Larey Dresser, MD sent at 12/24/2019  5:22 PM EDT ----- Stop pm torsemide (decrease to 40 mg qam), get BMET in 2 wks.

## 2020-01-01 ENCOUNTER — Other Ambulatory Visit: Payer: Self-pay

## 2020-01-01 ENCOUNTER — Ambulatory Visit (HOSPITAL_COMMUNITY)
Admission: RE | Admit: 2020-01-01 | Discharge: 2020-01-01 | Disposition: A | Discharge status: Home / Self Care | Payer: Medicare PPO | Source: Ambulatory Visit | Attending: Internal Medicine | Admitting: Internal Medicine

## 2020-01-01 DIAGNOSIS — I5032 Chronic diastolic (congestive) heart failure: Secondary | ICD-10-CM | POA: Insufficient documentation

## 2020-01-01 LAB — BASIC METABOLIC PANEL
Anion gap: 10 (ref 5–15)
BUN: 58 mg/dL — ABNORMAL HIGH (ref 8–23)
CO2: 27 mmol/L (ref 22–32)
Calcium: 9.8 mg/dL (ref 8.9–10.3)
Chloride: 100 mmol/L (ref 98–111)
Creatinine, Ser: 2 mg/dL — ABNORMAL HIGH (ref 0.44–1.00)
GFR calc Af Amer: 29 mL/min — ABNORMAL LOW (ref >60)
GFR calc non Af Amer: 25 mL/min — ABNORMAL LOW (ref >60)
Glucose, Bld: 290 mg/dL — ABNORMAL HIGH (ref 70–99)
Potassium: 4.2 mmol/L (ref 3.5–5.1)
Sodium: 137 mmol/L (ref 135–145)

## 2020-01-12 ENCOUNTER — Other Ambulatory Visit (HOSPITAL_COMMUNITY): Payer: Self-pay | Admitting: Cardiology

## 2020-01-19 ENCOUNTER — Other Ambulatory Visit (HOSPITAL_COMMUNITY): Payer: Self-pay | Admitting: Cardiology

## 2020-01-21 ENCOUNTER — Encounter (HOSPITAL_COMMUNITY): Payer: Self-pay

## 2020-02-03 ENCOUNTER — Other Ambulatory Visit (HOSPITAL_COMMUNITY): Payer: Self-pay | Admitting: Cardiology

## 2020-02-11 ENCOUNTER — Encounter (HOSPITAL_COMMUNITY): Payer: Self-pay

## 2020-02-11 ENCOUNTER — Other Ambulatory Visit: Payer: Self-pay | Admitting: Pharmacist

## 2020-02-11 MED ORDER — REPATHA SURECLICK 140 MG/ML ~~LOC~~ SOAJ: 140.0000 mg | SUBCUTANEOUS | 3 refills | Status: DC

## 2020-02-18 NOTE — Progress Notes (Signed)
Virtual Visit via Telephone Note   This visit type was conducted due to national recommendations for restrictions regarding the COVID-19 Pandemic (e.g. social distancing) in an effort to limit this patient's exposure and mitigate transmission in our community.  Due to her co-morbid illnesses, this patient is at least at moderate risk for complications without adequate follow up.  This format is felt to be most appropriate for this patient at this time.  The patient did not have access to video technology/had technical difficulties with video requiring transitioning to audio format only (telephone).  All issues noted in this document were discussed and addressed.  No physical exam could be performed with this format.  Please refer to the patient's chart for her  consent to telehealth for [facility].   Evaluation Performed:  Follow-up visit  This visit type was conducted due to national recommendations for restrictions regarding the COVID-19 Pandemic (e.g. social distancing).  This format is felt to be most appropriate for this patient at this time.  All issues noted in this document were discussed and addressed.  No physical exam was performed (except for noted visual exam findings with Video Visits).  Please refer to the patient's chart (MyChart message for video visits and phone note for telephone visits) for the patient's consent to telehealth for [facility]  Date:  02/21/2020   ID:  [name] [name], [name] [name] [birth year], MRN [id number]  Patient Location:  Home  Provider location:   [place]  PCP:  [name], MD  Cardiologist: [name], MD Sleep Medicine:  Fransico Him, MD Electrophysiologist:  [name], MD   Chief Complaint:  OSA  History of Present Illness:    [name] [name] is a 69 y.o. female who presents via audio/video conferencing for a telehealth visit today.    [name] [name] a 68y.o.femalewith a hx of chronic diastolic CHF, CAD and afib who  was referred for a sleep study by Dr. Aundra Dubin due to excessive daytime sleepiness. She was found to have moderate OSA with an AHI of 22/hr with significant oxygen desaturations as low as 85%. She underwent CPAP titration to 18cm H2Obut did not tolerate the high pressure and was subsequently placed on auto Pap from 6 to 14 cm H2O.  She has had problems with  restless legs and I started her on Requip 0.33m nightly.  Ferritin was normal.  Her AHI was also too high as well.  She was having problems with sleeping due to the restless legs disrupting her sleep and then getting insomnia and napping during the day.   When I saw her last she was doing better and RLS had improved with Requip.  Her BiPAP pressure was decreased at last visit because she felt the pressure was still too high and was getting too much air in her stomach.    She is doing well with her PAP device and thinks that she has gotten used to it.  She has been having problems recently at night with waking up during the night feeling SOB despite using the BiPAP.  She has noticed at home she gets shortwinded and walking up stairs she gets SOB which is worse while [place].  She says that her clothes don't feel tight and her ankles are not swollen.  She thinks she may be retaining fluid in her lower abdomen.  She has an appt with her PCP today and she has followup with Dr. MAundra Dubinnext Wed.  She tolerates  the mask and feels the pressure is adequate. She is concerned that recently her device has been noting AHI levels at high as 34/hr some nights.  Her device has not been recording on Airview for the past month and she plans on bringing her chip in next week for a download.   The patient does not have symptoms concerning for COVID-19 infection (fever, chills, cough, or new shortness of breath).   Prior CV studies:   The following studies were reviewed today:  PAP compliance download  Past Medical History:  Diagnosis  Date  . Anxiety   . Aortic stenosis    a. bicuspid aortic valve; mean gradient of 20 mmHg in 2/10; b. s/p bioprosth AVR (19-mm Edwards pericardial valve) with a redo coronary artery bypass graft procedure in 07/2009; postoperative Dressler's syndrome    . Arthritis   . Breast cancer ([place])   . Carcinoma, renal cell ([place]) 05/2007   Laparoscopic right nephrectomy  . 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 Chronic diastolic CHF (congestive heart failure) ([place])    a. 9.2014 EchoP EF 60-65%, no rwma, bioprosth AVR, mean gradient of 18mHg, mild to mod MR, PASP 562mg.  . CKD (chronic kidney disease), stage II   . Colitis, ischemic ([facility]2008  . Coronary artery disease    a. initial CABG with SVG-RCA in 12/05. b. redo SVG-RCA and bioprosthetic AVR in 1/11. d. 9/14 - PCI with DES to distal LM/proximal LAD was done rather than bypass as she would have been a poor candidate for redo sternotomy. d. S/p DES to prox LM 05/2014.  . Diabetes mellitus 03/2010    TYPE II: Hemoglobin A1c of 7.4 in  03/2010; 8.4 in 06/2010; treated with insulin pump  . Duodenal ulcer    Remote; H. pylori positive  . Gastroparesis due to DM (H[place]10/04/2012  . Genetic testing 01/14/2017   Ms. Krogstad underwent genetic counseling and testing for hereditary cancer syndromes on 01/01/2017. Her results were negative for mutations in all 83 genes analyzed by [facility]'s 83-gene Common Hereditary Cancers Panel. Genes analyzed include: ALK, APC, ATM, AXIN2, BAP1, BARD1, BLM, BMPR1A, BRCA1, BRCA2, BRIP1, CASR, CDC73, CDH1, CDK4, CDKN1B, CDKN1C, CDKN2A, CEBPA, CHEK2, CTNNA1, DICER1, DIS3L2,   . Hodgkin's disease ([place]CC[place]1991   Mantle radiation therapy  . Hodgkin's disease (HCLago  . Hyperlipidemia   . Hyperplastic gastric polyp 06/23/2012  . Hypertension   . Iron deficiency anemia    a. 03/2013 EGD: essentially normal. Possible slow GIB.  . Migraines   . NASH (nonalcoholic steatohepatitis) 1999   -biopsy in 1999  . OSA on CPAP  04/24/2017   Moderate with AHI 22/hr now on CPAP at 18cm H2o  . Osteopenia     hip on DEXA in October 2007.  . Osteoporosis   . Oxygen deficiency   . Pacemaker    six sinus syndrome  . PAF (paroxysmal atrial fibrillation) ([place]   a.  h/o brief episodes noted on ppm interrogation. b. given GI bleeding and need to be on both ASA 81 and Brilinta, risks likely outweigh benefits of anticoagulation.   . Renal insufficiency   . Small bowel obstruction (HCC)    Recurrent; resolved after resection of a lipoma  . Syncope    a. H/o recurrent syncope with pauses on loop recorder s/p Medtronic pacemaker 07/2012.   Past Surgical History:  Procedure Laterality Date  . ABDOMINAL HYSTERECTOMY    . AORTIC VALVE REPLACEMENT  2011   Aortic valve  replacement surgery with a 19 mm bioprosthetic device post redo CABG January 2011.  Marland Kitchen BONE MARROW BIOPSY    . BREAST EXCISIONAL BIOPSY     benign, bilateral  . CARDIAC CATHETERIZATION    . CERVICAL BIOPSY    . COLONOSCOPY     Multiple with adenomatous polyps  . COLONOSCOPY  06/23/2012   Procedure: COLONOSCOPY;  Surgeon: Gatha Mayer, MD;  Location: [facility] ENDOSCOPY;  Service: Endoscopy;  Laterality: N/A;  . CORONARY ARTERY BYPASS GRAFT  2005, 2011   CABG-most recent SVG to RCA-12/05; RCA occluded with patent graft in 2006; Redo bypass in 07/2009  . ESOPHAGOGASTRODUODENOSCOPY    . ESOPHAGOGASTRODUODENOSCOPY  06/23/2012   Procedure: ESOPHAGOGASTRODUODENOSCOPY (EGD);  Surgeon: Gatha Mayer, MD;  Location: [facility];  Service: Endoscopy;  Laterality: N/A;  . ESOPHAGOGASTRODUODENOSCOPY N/A 04/06/2013   Procedure: ESOPHAGOGASTRODUODENOSCOPY (EGD);  Surgeon: Irene Shipper, MD;  Location: [facility] ENDOSCOPY;  Service: Endoscopy;  Laterality: N/A;  . EXPLORATORY LAPAROTOMY W/ BOWEL RESECTION     Small bowel resection of lipoma  . GIVENS CAPSULE STUDY N/A 04/30/2013   Procedure: GIVENS CAPSULE STUDY;  Surgeon: Gatha Mayer, MD;  Location: [place];  Service: Endoscopy;   Laterality: N/A;  . LEFT AND RIGHT HEART CATHETERIZATION WITH CORONARY ANGIOGRAM N/A 04/07/2013   Procedure: LEFT AND RIGHT HEART CATHETERIZATION WITH CORONARY ANGIOGRAM;  Surgeon: Larey Dresser, MD;  Location: [facility] CATH LAB;  Service: Cardiovascular;  Laterality: N/A;  . LEFT ATRIAL APPENDAGE OCCLUSION N/A 02/16/2016   Watchman not placed - nickel allergy  . LEFT HEART CATHETERIZATION WITH CORONARY/GRAFT ANGIOGRAM N/A 06/07/2014   Procedure: LEFT HEART CATHETERIZATION WITH Beatrix Fetters;  Surgeon: Burnell Blanks, MD;  Location: [facility] CATH LAB;  Service: Cardiovascular;  Laterality: N/A;  . LIVER BIOPSY    . LOOP RECORDER IMPLANT N/A 12/10/2011   Procedure: LOOP RECORDER IMPLANT;  Surgeon: Evans Lance, MD;  Location: [facility] CATH LAB;  Service: Cardiovascular;  Laterality: N/A;  . MALONEY DILATION  06/23/2012   Procedure: [name] DILATION;  Surgeon: Gatha Mayer, MD;  Location: [facility] ENDOSCOPY;  Service: Endoscopy;;  54 fr  . MASTECTOMY Left 05/17/2015   total   . NEPHRECTOMY  2008   Laparoscopic right nephrectomy, renal cell carcinoma  . PACEMAKER INSERTION  08/21/2012   Medtronic Adapta L dual-chamber pacemaker  . PERCUTANEOUS CORONARY STENT INTERVENTION (PCI-S) N/A 04/09/2013   Procedure: PERCUTANEOUS CORONARY STENT INTERVENTION (PCI-S);  Surgeon: Burnell Blanks, MD;  Location: [facility] CATH LAB;  Service: Cardiovascular;  Laterality: N/A;  . PERMANENT PACEMAKER INSERTION N/A 08/21/2012   Procedure: PERMANENT PACEMAKER INSERTION;  Surgeon: Evans Lance, MD;  Location: [facility] CATH LAB;  Service: Cardiovascular;  Laterality: N/A;  . RIGHT HEART CATH N/A 03/18/2019   Procedure: RIGHT HEART CATH;  Surgeon: Larey Dresser, MD;  Location: [place] CV LAB;  Service: Cardiovascular;  Laterality: N/A;  . RIGHT HEART CATH AND CORONARY/GRAFT ANGIOGRAPHY N/A 04/19/2017   Procedure: RIGHT HEART CATH AND CORONARY/GRAFT ANGIOGRAPHY;  Surgeon: Larey Dresser, MD;  Location: [place] CV  LAB;  Service: Cardiovascular;  Laterality: N/A;  . TEE WITHOUT CARDIOVERSION N/A 12/07/2015   Procedure: TRANSESOPHAGEAL ECHOCARDIOGRAM (TEE);  Surgeon: Larey Dresser, MD;  Location: [place];  Service: Cardiovascular;  Laterality: N/A;  . TOTAL ABDOMINAL HYSTERECTOMY W/ BILATERAL SALPINGOOPHORECTOMY  2000   endometriosis and ovarian cysts  . TOTAL MASTECTOMY Left 05/17/2015   Procedure: LEFT TOTAL MASTECTOMY;  Surgeon: Rolm Bookbinder, MD;  Location: [place];  Service:  General;  Laterality: Left;  . TUMOR EXCISION  1981   removal of Hodgkins lymphoma     Current Meds  Medication Sig  . acyclovir ointment (ZOVIRAX) 5 % Apply 1 application topically every 3 (three) hours as needed (fever blister/cold sores).  . ALPRAZolam (XANAX) 0.5 MG tablet Take 1 tablet (0.5 mg total) by mouth 2 (two) times daily as needed for anxiety.  Marland Kitchen amLODipine (NORVASC) 2.5 MG tablet TAKE ONE (1) TABLET BY MOUTH EVERY DAY  . APIDRA 100 UNIT/ML injection Inject 40 Units into the skin as directed. INFUSE THROUGH INSULIN PUMP UTD, Bolus depends on carb intake  . aspirin EC 81 MG tablet Take 81 mg by mouth at bedtime.  Marland Kitchen azelastine (ASTELIN) 0.1 % nasal spray Place 2 sprays into both nostrils 2 (two) times daily. Use in each nostril as directed  . BRILINTA 60 MG TABS tablet TAKE ONE TABLET (60MG TOTAL) BY MOUTH TWICE DAILY  . clobetasol (OLUX) 0.05 % topical foam Apply 1 application topically 2 (two) times daily as needed (skin flaking).  . Coenzyme Q10 (CO Q 10) 100 MG CAPS Take 100 mg by mouth at bedtime.   . colchicine 0.6 MG tablet Take 0.6 mg by mouth daily as needed (gout flare).  . CONTOUR NEXT TEST test strip USE TO TEST BLOOD SUGAR UP TO EIGHT TIMES A DAY  . diclofenac Sodium (VOLTAREN) 1 % GEL as needed.  . docusate sodium (COLACE) 100 MG capsule Take 100 mg by mouth 2 (two) times daily.   . Evolocumab (REPATHA SURECLICK) 106 MG/ML SOAJ Inject 140 mg into the skin every 14 (fourteen) days.  .  famotidine (PEPCID) 40 MG tablet Take 40 mg by mouth daily.  Marland Kitchen glucagon (GLUCAGON EMERGENCY) 1 MG injection Inject 1 mg into the vein once as needed (low blood sugar).   . isosorbide mononitrate (IMDUR) 120 MG 24 hr tablet TAKE ONE TABLET (120MG TOTAL) BY MOUTH DAILY  . latanoprost (XALATAN) 0.005 % ophthalmic solution Place 1 drop into both eyes at bedtime.  Marland Kitchen MAGNESIUM CITRATE PO Take 250 mg by mouth daily.  . metolazone (ZAROXOLYN) 2.5 MG tablet Take 2.5 mg (1 tab) once as directed by CHF clinic. Call before taking [phone]  . metoprolol tartrate (LOPRESSOR) 50 MG tablet TAKE ONE AND ONE-HALF TABLETS (75MG TOTAL) BY MOUTH TWICE DAILY  . moxifloxacin (VIGAMOX) 0.5 % ophthalmic solution Apply 1 drop to eye 4 (four) times daily.  . mupirocin cream (BACTROBAN) 2 % Apply 1 application topically 2 (two) times daily as needed.  . nitroGLYCERIN (NITROLINGUAL) 0.4 MG/SPRAY spray PLACE 1 SPRAY UNDER THE TONGUE EVERY 5 MINUTES FOR 3 DOSES AS NEEDED FOR CHEST PAIN.  Marland Kitchen Polyethyl Glycol-Propyl Glycol (SYSTANE) 0.4-0.3 % SOLN Apply 1 drop to eye daily as needed (dry eyes).  . pravastatin (PRAVACHOL) 10 MG tablet TAKE ONE TABLET (10MG TOTAL) BY MOUTH DAILY  . prednisoLONE acetate (PRED FORTE) 1 % ophthalmic suspension 1 drop 4 (four) times daily.  Marland Kitchen rOPINIRole (REQUIP) 0.25 MG tablet Take 1 tablet (0.25 mg total) by mouth daily. Take two hours prior to bedtime every night  . silver nitrate applicators 03-50 % applicator Use as needed to stop superficial skin bleeding.  Marland Kitchen spironolactone (ALDACTONE) 25 MG tablet Take 12.5 mg by mouth daily.  Marland Kitchen torsemide (DEMADEX) 20 MG tablet Take 2 tablets (40 mg total) by mouth daily.  Marland Kitchen ULORIC 40 MG tablet Take 1 tablet (40 mg total) by mouth at bedtime.  Marland Kitchen UNABLE TO FIND Med Name:  So Clean CPAP cleaning system (Patient taking differently: Med Name: BiPap)  . VASCEPA 1 g capsule TAKE 2 CAPSULES BY MOUTH TWICE A DAY  . Wheat Dextrin (BENEFIBER) POWD Take 1 Dose by mouth  daily.     Allergies:   Avandia [rosiglitazone maleate], Cephalexin, Clindamycin hcl, Humalog [insulin lispro], Lincomycin, Metformin, Novolog [insulin aspart], Penicillins, Rosiglitazone, Tizanidine, Adhesive [tape], Atorvastatin, Cholestyramine, Gentamycin [gentamicin], Iodinated diagnostic agents, Limonene, Pravastatin, Prednisone, Ranexa [ranolazine], Rosuvastatin, Simvastatin, Strawberry extract, Clopidogrel, Codeine, Erythromycin, Hydrocodone-acetaminophen, Latex, Neomycin, Nickel, Ranitidine, Tamiflu [oseltamivir], and Tramadol   Social History   Tobacco Use  . Smoking status: Never Smoker  . Smokeless tobacco: Never Used  Vaping Use  . Vaping Use: Never used  Substance Use Topics  . Alcohol use: No    Alcohol/week: 0.0 standard drinks  . Drug use: No     Family Hx: The patient's family history includes Breast cancer (age of onset: 55) in her sister; Cancer (age of onset: 63) in her sister; Cancer (age of onset: 17) in her maternal grandfather; Diabetes in her father; Glaucoma in her brother; Heart attack in her father, maternal grandmother, and mother; Heart disease in her father and mother; Hypertension in her brother; Lung cancer in her sister. There is no history of Colon cancer or Stomach cancer.  ROS:   Please see the history of present illness.     All other systems reviewed and are negative.   Labs/Other Tests and Data Reviewed:    Recent Labs: 04/08/2019: B Natriuretic Peptide 111.8 04/13/2019: TSH 2.141 11/16/2019: ALT 24; Hemoglobin 12.9; Platelets 143 01/01/2020: BUN 58; Creatinine, Ser 2.00; Potassium 4.2; Sodium 137   Recent Lipid Panel Lab Results  Component Value Date/Time   CHOL 89 03/03/2019 11:09 AM   TRIG 147 03/03/2019 11:09 AM   HDL 38 (L) 03/03/2019 11:09 AM   CHOLHDL 2.3 03/03/2019 11:09 AM   LDLCALC 22 03/03/2019 11:09 AM   LDLDIRECT 43.0 12/14/2014 09:38 AM    Wt Readings from Last 3 Encounters:  02/19/20 115 lb 12.8 oz (52.5 kg)  11/24/19  114 lb 3.2 oz (51.8 kg)  11/23/19 113 lb 14.4 oz (51.7 kg)     Objective:    Vital Signs:  Ht 5' 3"  (1.6 m)   Wt 115 lb 12.8 oz (52.5 kg)   BMI 20.51 kg/m     ASSESSMENT & PLAN:    1.  OSA -  The patient is tolerating PAP therapy and her bloating has improved on a lower pressure.  -unfortunately we have no download to review today as no data is available since May for unknown reasons and she says she is using it nightly.   -she will bring her chip in for a download next week.  She says that recently her Airview app has been giving her AHI reading in the 30's  -she says that her mask is leaking some but does not seem to correlate with when she gets elevated AHI -she has had problems with the cushions getting hard before it is time for her to get another cushion and I have asked her to talk with her DME  2.  HTN -continue Lopressor 37m BID, Sprio 240mdaily and amlodipine 2.6m70maily  3.  Restless Legs syndrome -significantly improved on Requip but she seems to have reached a platuae and is now having more episodes of restless legs -she has gastroparesis and has had to take the Requip earlier in the night.  I have recommended that she try to  take the Requip at dinner and if this does not help then I will increase the dose.  4.  Orthopnea and PND -this is new recently and occurs despite using PAP -she also has noticed increased DOE and abdominal fullness but no LE edema.  -she has an appt with Dr. Aundra Dubin on Wed.  I am concerned her SOB may not be related to her OSA but will await the PAP download to discern what is going on .   COVID-19 Education: The signs and symptoms of COVID-19 were discussed with the patient and how to seek care for testing (follow up with PCP or arrange E-visit).  The importance of social distancing was discussed today.  Patient Risk:   After full review of this patient's clinical status, I feel that they are at least moderate risk at this time.  Time:    Today, I have spent 20 minutes on telemedicine discussing medical problems including OSA< HTN, RLS and reviewing patient's chart including PAP compliance download.  Medication Adjustments/Labs and Tests Ordered: Current medicines are reviewed at length with the patient today.  Concerns regarding medicines are outlined above.  Tests Ordered: No orders of the defined types were placed in this encounter.  Medication Changes: No orders of the defined types were placed in this encounter.   Disposition:  Follow up in 8 weeks  Signed, Fransico Him, MD  02/21/2020 4:52 PM    [facility]

## 2020-02-19 ENCOUNTER — Other Ambulatory Visit: Payer: Self-pay

## 2020-02-19 ENCOUNTER — Encounter: Payer: Self-pay | Admitting: Cardiology

## 2020-02-19 ENCOUNTER — Telehealth (INDEPENDENT_AMBULATORY_CARE_PROVIDER_SITE_OTHER): Payer: Medicare PPO | Admitting: Cardiology

## 2020-02-19 VITALS — Ht 63.0 in | Wt 115.8 lb

## 2020-02-19 DIAGNOSIS — R0601 Orthopnea: Secondary | ICD-10-CM

## 2020-02-19 DIAGNOSIS — I10 Essential (primary) hypertension: Secondary | ICD-10-CM | POA: Diagnosis not present

## 2020-02-19 DIAGNOSIS — G4733 Obstructive sleep apnea (adult) (pediatric): Secondary | ICD-10-CM | POA: Diagnosis not present

## 2020-02-19 DIAGNOSIS — G2581 Restless legs syndrome: Secondary | ICD-10-CM

## 2020-02-19 DIAGNOSIS — Z9989 Dependence on other enabling machines and devices: Secondary | ICD-10-CM

## 2020-02-24 ENCOUNTER — Other Ambulatory Visit: Payer: Self-pay

## 2020-02-24 ENCOUNTER — Ambulatory Visit (HOSPITAL_COMMUNITY)
Admission: RE | Admit: 2020-02-24 | Discharge: 2020-02-24 | Disposition: A | Discharge status: Home / Self Care | Payer: Medicare PPO | Source: Ambulatory Visit | Attending: Cardiology | Admitting: Cardiology

## 2020-02-24 ENCOUNTER — Encounter (HOSPITAL_COMMUNITY): Payer: Self-pay | Admitting: Cardiology

## 2020-02-24 VITALS — BP 110/60 | HR 72 | Wt 116.0 lb

## 2020-02-24 DIAGNOSIS — Z7982 Long term (current) use of aspirin: Secondary | ICD-10-CM | POA: Insufficient documentation

## 2020-02-24 DIAGNOSIS — Z905 Acquired absence of kidney: Secondary | ICD-10-CM | POA: Diagnosis not present

## 2020-02-24 DIAGNOSIS — Z794 Long term (current) use of insulin: Secondary | ICD-10-CM | POA: Insufficient documentation

## 2020-02-24 DIAGNOSIS — Z9013 Acquired absence of bilateral breasts and nipples: Secondary | ICD-10-CM | POA: Insufficient documentation

## 2020-02-24 DIAGNOSIS — Z952 Presence of prosthetic heart valve: Secondary | ICD-10-CM | POA: Diagnosis not present

## 2020-02-24 DIAGNOSIS — Z951 Presence of aortocoronary bypass graft: Secondary | ICD-10-CM | POA: Insufficient documentation

## 2020-02-24 DIAGNOSIS — E1043 Type 1 diabetes mellitus with diabetic autonomic (poly)neuropathy: Secondary | ICD-10-CM | POA: Diagnosis not present

## 2020-02-24 DIAGNOSIS — I440 Atrioventricular block, first degree: Secondary | ICD-10-CM | POA: Insufficient documentation

## 2020-02-24 DIAGNOSIS — Z888 Allergy status to other drugs, medicaments and biological substances status: Secondary | ICD-10-CM | POA: Insufficient documentation

## 2020-02-24 DIAGNOSIS — Z8571 Personal history of Hodgkin lymphoma: Secondary | ICD-10-CM | POA: Diagnosis not present

## 2020-02-24 DIAGNOSIS — I495 Sick sinus syndrome: Secondary | ICD-10-CM | POA: Insufficient documentation

## 2020-02-24 DIAGNOSIS — Z950 Presence of cardiac pacemaker: Secondary | ICD-10-CM | POA: Diagnosis not present

## 2020-02-24 DIAGNOSIS — M109 Gout, unspecified: Secondary | ICD-10-CM | POA: Insufficient documentation

## 2020-02-24 DIAGNOSIS — E1022 Type 1 diabetes mellitus with diabetic chronic kidney disease: Secondary | ICD-10-CM | POA: Diagnosis not present

## 2020-02-24 DIAGNOSIS — I130 Hypertensive heart and chronic kidney disease with heart failure and stage 1 through stage 4 chronic kidney disease, or unspecified chronic kidney disease: Secondary | ICD-10-CM | POA: Insufficient documentation

## 2020-02-24 DIAGNOSIS — Z923 Personal history of irradiation: Secondary | ICD-10-CM | POA: Insufficient documentation

## 2020-02-24 DIAGNOSIS — G4733 Obstructive sleep apnea (adult) (pediatric): Secondary | ICD-10-CM | POA: Insufficient documentation

## 2020-02-24 DIAGNOSIS — N184 Chronic kidney disease, stage 4 (severe): Secondary | ICD-10-CM | POA: Insufficient documentation

## 2020-02-24 DIAGNOSIS — E785 Hyperlipidemia, unspecified: Secondary | ICD-10-CM | POA: Diagnosis not present

## 2020-02-24 DIAGNOSIS — Z85528 Personal history of other malignant neoplasm of kidney: Secondary | ICD-10-CM | POA: Insufficient documentation

## 2020-02-24 DIAGNOSIS — Z833 Family history of diabetes mellitus: Secondary | ICD-10-CM | POA: Insufficient documentation

## 2020-02-24 DIAGNOSIS — Z8673 Personal history of transient ischemic attack (TIA), and cerebral infarction without residual deficits: Secondary | ICD-10-CM | POA: Diagnosis not present

## 2020-02-24 DIAGNOSIS — I425 Other restrictive cardiomyopathy: Secondary | ICD-10-CM | POA: Insufficient documentation

## 2020-02-24 DIAGNOSIS — Z91048 Other nonmedicinal substance allergy status: Secondary | ICD-10-CM | POA: Insufficient documentation

## 2020-02-24 DIAGNOSIS — Z881 Allergy status to other antibiotic agents status: Secondary | ICD-10-CM | POA: Insufficient documentation

## 2020-02-24 DIAGNOSIS — D696 Thrombocytopenia, unspecified: Secondary | ICD-10-CM | POA: Diagnosis not present

## 2020-02-24 DIAGNOSIS — Z955 Presence of coronary angioplasty implant and graft: Secondary | ICD-10-CM | POA: Diagnosis not present

## 2020-02-24 DIAGNOSIS — Z79899 Other long term (current) drug therapy: Secondary | ICD-10-CM | POA: Diagnosis not present

## 2020-02-24 DIAGNOSIS — D509 Iron deficiency anemia, unspecified: Secondary | ICD-10-CM | POA: Diagnosis not present

## 2020-02-24 DIAGNOSIS — I350 Nonrheumatic aortic (valve) stenosis: Secondary | ICD-10-CM | POA: Diagnosis not present

## 2020-02-24 DIAGNOSIS — Z86000 Personal history of in-situ neoplasm of breast: Secondary | ICD-10-CM | POA: Insufficient documentation

## 2020-02-24 DIAGNOSIS — Z8249 Family history of ischemic heart disease and other diseases of the circulatory system: Secondary | ICD-10-CM | POA: Insufficient documentation

## 2020-02-24 DIAGNOSIS — I2510 Atherosclerotic heart disease of native coronary artery without angina pectoris: Secondary | ICD-10-CM | POA: Diagnosis not present

## 2020-02-24 DIAGNOSIS — Z880 Allergy status to penicillin: Secondary | ICD-10-CM | POA: Insufficient documentation

## 2020-02-24 DIAGNOSIS — Z9641 Presence of insulin pump (external) (internal): Secondary | ICD-10-CM | POA: Insufficient documentation

## 2020-02-24 DIAGNOSIS — I480 Paroxysmal atrial fibrillation: Secondary | ICD-10-CM | POA: Diagnosis not present

## 2020-02-24 DIAGNOSIS — Z885 Allergy status to narcotic agent status: Secondary | ICD-10-CM | POA: Insufficient documentation

## 2020-02-24 DIAGNOSIS — Z7902 Long term (current) use of antithrombotics/antiplatelets: Secondary | ICD-10-CM | POA: Insufficient documentation

## 2020-02-24 DIAGNOSIS — I5032 Chronic diastolic (congestive) heart failure: Secondary | ICD-10-CM | POA: Diagnosis not present

## 2020-02-24 DIAGNOSIS — Z8711 Personal history of peptic ulcer disease: Secondary | ICD-10-CM | POA: Insufficient documentation

## 2020-02-24 LAB — BASIC METABOLIC PANEL
Anion gap: 12 (ref 5–15)
BUN: 56 mg/dL — ABNORMAL HIGH (ref 8–23)
CO2: 25 mmol/L (ref 22–32)
Calcium: 9.9 mg/dL (ref 8.9–10.3)
Chloride: 102 mmol/L (ref 98–111)
Creatinine, Ser: 1.69 mg/dL — ABNORMAL HIGH (ref 0.44–1.00)
GFR calc Af Amer: 36 mL/min — ABNORMAL LOW (ref >60)
GFR calc non Af Amer: 31 mL/min — ABNORMAL LOW (ref >60)
Glucose, Bld: 279 mg/dL — ABNORMAL HIGH (ref 70–99)
Potassium: 3.8 mmol/L (ref 3.5–5.1)
Sodium: 139 mmol/L (ref 135–145)

## 2020-02-24 LAB — CBC
HCT: 33.7 % — ABNORMAL LOW (ref 36.0–46.0)
Hemoglobin: 10.6 g/dL — ABNORMAL LOW (ref 12.0–15.0)
MCH: 29.3 pg (ref 26.0–34.0)
MCHC: 31.5 g/dL (ref 30.0–36.0)
MCV: 93.1 fL (ref 80.0–100.0)
Platelets: 116 10*3/uL — ABNORMAL LOW (ref 150–400)
RBC: 3.62 MIL/uL — ABNORMAL LOW (ref 3.87–5.11)
RDW: 13.7 % (ref 11.5–15.5)
WBC: 9.2 10*3/uL (ref 4.0–10.5)
nRBC: 0 % (ref 0.0–0.2)

## 2020-02-24 NOTE — Patient Instructions (Signed)
STOP Amlodipine  Labs today We will only contact you if something comes back abnormal or we need to make some changes. Otherwise no news is good news!  Your physician recommends that you schedule a follow-up appointment in: 6 weeks with echo  Your physician has requested that you have an echocardiogram. Echocardiography is a painless test that uses sound waves to create images of your heart. It provides your doctor with information about the size and shape of your heart and how well your heart's chambers and valves are working. This procedure takes approximately one hour. There are no restrictions for this procedure.  If you have any questions or concerns before your next appointment please send [name] a message through [place] or call our office at [phone].    TO LEAVE A MESSAGE FOR THE NURSE SELECT OPTION 2, PLEASE LEAVE A MESSAGE INCLUDING: . YOUR NAME . DATE OF BIRTH . CALL BACK NUMBER . REASON FOR CALL**this is important as we prioritize the call backs  YOU WILL RECEIVE A CALL BACK THE SAME DAY AS LONG AS YOU CALL BEFORE 4:00 PM .

## 2020-02-25 ENCOUNTER — Encounter (HOSPITAL_COMMUNITY): Payer: Self-pay

## 2020-02-25 ENCOUNTER — Other Ambulatory Visit (HOSPITAL_COMMUNITY): Payer: Self-pay

## 2020-02-25 DIAGNOSIS — I5032 Chronic diastolic (congestive) heart failure: Secondary | ICD-10-CM

## 2020-02-25 NOTE — Progress Notes (Signed)
Date:  02/25/2020   ID:  Wren, Pryce [birth year], MRN [id number]   Provider location: [address] [place] Type of Visit: Established patient  PCP:  [name], MD  Cardiologist:  No primary care provider on file. Primary HF: Dr. Aundra Dubin   History of Present Illness: [name] [name] is a 69 y.o. female who has a history of CAD s/p CABG in 12/05 and redo in 1/11, bioprosthetic AVR, diastolic CHF, and sick sinus syndrome s/p PCM presents for cardiology followup.  She had initial CABG with SVG-RCA in 12/05 followed by redo SVG-RCA and bioprosthetic AVR in 1/11.  She has had diastolic CHF and is on Lasix.  In 9/14, she had a CPX test.  This showed severe functional limitation with ischemic ECG changes (inferolateral ST depression and ST elevation in V1 and V2). She had chest pain.  She had R/LHC in 9/14.  This showed elevated left and right heart filling pressures, patent SVG-PDA, and 80% distal LM/80% ostial LAD stenosis.  She was not a candidate for redo CABG (had 2 prior sternotomies as well as chest radiation for Hodgkins lymphoma).  She had DES from left main into proximal LAD and will need long-term DAPT => Brilinta was used as she has been a poor responder to Plavix in the past.  She re-developed angina in 11/15 and was admitted with unstable angina.  LHC showed 80% ostial LM and patent SVG-RCA. She had DES to ostial LM. Echo in 1/18 showed EF 60-65%, normal bioprosthetic aortic valve, mild-moderate MR, PASP 35 mmHg.   She has chronic iron deficiency anemia thought to be due to a slow GI bleed.  Last EGD was unremarkable and a capsule endoscopy was also unremarkable.  She has had periodic transfusions.    She had bilateral mastectomy for DCIS in 01/00 without complication.  She has an insulin pump followed by an endocrinologist at [facility].   Given myalgias with statins, she was started on Praluent.  LDL is now excellent.    In the past she has had short runs  of atrial fibrillation.  She is not anticoagulated. Planned for Watchman placement, but given her nickel allergy, we were unable to place a Watchman.     Diagnosed with OSA, now on CPAP.   Given ongoing problems with exertional dyspnea and occasional chest pain, Cardiolite was repeated in 9/18 and showed inferior changes.  She underwent left/right heart cath in 9/18.  This showed elevated filling pressures, R>L.  There was 75+% ostial LCx stenosis.  However, there were 2 layers of stent from the mid left main into the ostial LAD, and LCx intervention was thought to be very risky.  It was decided to manage her medically.  Echo was repeated in 10/18 showing EF 55-60%, moderate to severe TR.    Given recurrent chest pain, she had ETT-Cardiolite done in 3/19.  This was a normal study with no evidence for ischemia or infarction.  Echo in 1/20 showed EF 55-60%, mild LVH, stable bioprosthetic aortic valve, moderate-severe TR.    Recently, she has been more short of breath with exertion and renal function has been worse.  She was set up for echo and RHC.  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  RHC in 8/20 showed primarily  right-sided HF.  Cardiolite in 8/20 showed no ischemia or infarction.  Torsemide was increased and she cut back on sodium in her diet.    CPX in 11/20 showed severe functional limitation from HF.   She saw Dr. Mosetta Pigeon at [facility] for evaluation for heart/kidney transplant (for restrictive cardiomyopathy).  She was determined to be a poor surgical candidate due to extensive vascular calcifications.   She returns today for followup of CHF and CAD.  Weight stable. Using Bipap.  She has had episodes of dizziness/lightheadedness while walking more frequently over  the last 2 wks.  Episodes are associated with dyspnea but not palpitations.  No syncope or falls.  Generally not short of breath walking on flat ground at a steady pace.  She does get short of breath walking fast.  She will occasionally take an extra torsemide when she feels like her weight is up.  Rare chest pain episodes.   ECG (personally reviewed): NSR, long 1st degree AVB, LVH  Labs (2/19): hgb 13.2 Labs @ [facility] (3/19): K 4.7, creatinine 1.4, ANA negative, RF negative Labs (4/19): K 3.9, creatinine 1.52 => 1.63 Labs (6/19): LDL 8, HDL 42, TGs 259 Labs (9/19): K 4.9, creatinine 1.65, hgb 13.4, plts 129, LDL 46,TGs 182 Labs (12/19): K 4.9, creatinine 2.07, hgb 12.4 Labs (3/20): K 4, creatinine 1.8 Labs (6/20): K 4.7, creatinine 1.47 Labs (8/20): creatinine 2.13 => 2.46 => 2.4, LDL 22 Labs (9/20): K 4.8, creatinine 2.74, AST/ALT normal, hgb 12.6 Labs (1/21): K 4.1, creatinine 1.86, hgb 11.7 Labs (4/21): K 4.7, creatinine 1.7 Labs (6/21): K 4.2, creatinine 2 => 1.7  PMH: 1. Sick sinus syndrome s/p [employer] PCM.  2. HTN 3. H/o SBO 4. Renal cell carcinoma s/p right nephrectomy in 2008.  5. 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  Fibroscan in 11/14 showed advanced fibrosis concerning for cirrhosis. EGD in 11/14 showed no varices.  6. Diastolic CHF: Echo (4/43) with EF 60-65%, bioprosthetic aortic valve with mean gradient 15 mmHg, mild MR.  CPX (9/14): peak VO2 10.4, VE/VCO2 43.8, inferolateral ST depression and ST elevation in V1/V2 with severe dyspnea and chest pain, severe functional limitation with ischemic changes.  Echo (9/14) with EF 60-65%, normal RV size and systolic function, mild-moderate MR, bioprosthetic aortic valve normal, PA systolic pressure 51 mmHg, no evidence for pericardial constriction though exam incomplete for constriction.  Echo (11/15) with EF 55-60%, bioprosthetic aortic valve, mild-moderate MR, moderate TR, PA systolic pressure 42 mmHg.  - TEE (5/17) with EF 60-65%,  normal bioprosthetic aortic valve with mean gradient 13 mmHg, normal RV size and systolic function. - Echo (1/18) with EF 60-65%, normal bioprosthetic aortic valve, mild-moderate MR, PASP 35 mmHg.  - Echo (10/18) with EF 55-60%, bioprosthetic aortic valve mean gradient 10 mmHg, MAC with mild mitral stenosis (mean gradient 7) and mild mitral regurgitation, moderate to severe TR, PASP 43 mmHg.  - RHC (9/18): mean RA 13, PA 44/18 mean 33, mean PCWP 19, CI 3.45, PVR 2.7 WU.  - Echo (1/20): EF 55-60%, mild LVH, mild MR, stable bioprosthetic aortic valve, moderate-severe TR.  - RHC (8/20): mean RA 14, PA 43/15 mean 28, mean PCWP 13, PAPI 2, PVR 4.2, CI 2.31 - Echo (8/20): EF 55-60%, RV mildly dilated with normal systolic function, mildly D-shaped septum suggestive of RV pressure/volume overload, rheumatic-appearing mitral valve with mild mitral stenosis (mean gradient 6, MVA by PHT 4 cm^2), no evidence for pericardial constriction, moderate TR, bioprosthetic aortic valve functioning normally with mean gradient 6  mmHg.  - CPX (11/20): peak VO2 9.8, VE/VCO2 slope 38, RER 1.11 => severe functional limitation from HF.   7. TAH-BSO 8. Type I diabetes: Has insulin pump.  9. H/o ischemic colitis. 10. H/o PUD. 11. Diabetic gastroparesis. 12. Hyperlipidemia: Myalgias with Crestor and atorvastatin, elevated LFTs with simvastatin.  Myalgias with > 10 mg pravastatin. Now on Praluent.  13. CKD stage IV: Sees Dr. Lorrene Reid 14. Bicuspid aortic valve s/p 19 mm Edwards pericardial valve in 1/11 (had post-operative Dresslers syndrome).   15. CAD: CABG with SVG-RCA in 12/05.  Redo SVG-RCA in 1/11 with AVR.  LHC/RHC (9/14) with mean RA 18, PA 62/25 mean 43, mean PCWP 26, CI 2.8, 70-80% distal LM stenosis, 80% ostial LAD stenosis, total occlusion RCA, SVG-PDA patent.  Patient had DES LM into LAD.  Plan to continue long-term ASA/Brilinta (poor responder to Plavix).  ETT-cardiolite (4/15) with 6' exercise, EF 60%, ST depression in  recovery and mild chest pain, no ischemia or infarction by perfusion images.  Unstable angina 11/15 with 80% LM, patent SVG-RCA => DES to ostial LM.   - Lexiscan Cardiolite (2/18): EF 62%, no ischemia or infarction (normal).  She had a significant reaction to [place] and probably should not get again.  - Cardiolite (9/18): EF 72%, prior inferior infarct with mild peri-infarct ischemia.  - LHC (9/18): 2 layers stent left main - ostial LAD with 50% dLM in-stent restenosis, 75+% ostial LCx, totally occluded RCA, SVG-RCA patent.  Medical management planned.  - ETT-Cardiolite (4/19): EF normal, 7'11" exercise, no ischemia or infarction.  - Lexiscan Cardiolite (8/20): No ischemia/infarction.  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  17. Anemia: Iron-deficiency.  Suspect chronic GI blood loss.  EGD in 9/14 was unremarkable.  Capsule endoscopy 10/14 was unremarkable.  Has required periodic blood transfusions.  18. Atrial fibrillation: Paroxysmal, noted by [facility] interrogation.  19. Carotid stenosis: Carotid dopplers (2/15) with 40-59% bilateral stenosis. Carotid dopplers (3/16) with 40-59% bilateral ICA stenosis. Carotid dopplers (3/17) with 40-59% bilateral ICA stenosis.  - Carotid dopplers (3/18) with mild BICA stenosis.  20. 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  22. C difficile colitis 2017 23. Gout 24. ?TIA: head CT negative.  25. OSA: Moderate, uses CPAP.  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 Hypercalcemia 28. 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  29. Tricuspid regurgitation: Moderate by 8/20 echo.   Current Outpatient Medications  Medication Sig Dispense Refill  . acyclovir ointment (ZOVIRAX) 5 % Apply 1 application topically every 3 (three) hours as needed (fever blister/cold sores).    . ALPRAZolam  (XANAX) 0.5 MG tablet Take 1 tablet (0.5 mg total) by mouth 2 (two) times daily as needed for anxiety. 60 tablet 1  . amLODipine (NORVASC) 2.5 MG tablet TAKE ONE (1) TABLET BY MOUTH EVERY DAY 90 tablet 3  . APIDRA 100 UNIT/ML injection Inject 40 Units into the skin as directed. INFUSE THROUGH INSULIN PUMP UTD, Bolus depends on carb intake  2  . aspirin EC 81 MG tablet Take 81 mg by mouth at bedtime.    Marland Kitchen azelastine (ASTELIN) 0.1 % nasal spray Place 2 sprays into both nostrils 2 (two) times daily. Use in each nostril as directed    . BRILINTA 60 MG TABS tablet TAKE ONE TABLET (60MG TOTAL) BY MOUTH TWICE DAILY 180 tablet 3  . clobetasol (OLUX) 0.05 % topical foam  Apply 1 application topically 2 (two) times daily as needed (skin flaking).    . Coenzyme Q10 (CO Q 10) 100 MG CAPS Take 100 mg by mouth at bedtime.     . colchicine 0.6 MG tablet Take 0.6 mg by mouth daily as needed (gout flare).    . CONTOUR NEXT TEST test strip USE TO TEST BLOOD SUGAR UP TO EIGHT TIMES A DAY    . diclofenac Sodium (VOLTAREN) 1 % GEL as needed.    . docusate sodium (COLACE) 100 MG capsule Take 100 mg by mouth 2 (two) times daily.     . Evolocumab (REPATHA SURECLICK) 767 MG/ML SOAJ Inject 140 mg into the skin every 14 (fourteen) days. 6 pen 3  . famotidine (PEPCID) 40 MG tablet Take 40 mg by mouth daily.    Marland Kitchen glucagon (GLUCAGON EMERGENCY) 1 MG injection Inject 1 mg into the vein once as needed (low blood sugar).     . isosorbide mononitrate (IMDUR) 120 MG 24 hr tablet TAKE ONE TABLET (120MG TOTAL) BY MOUTH DAILY 90 tablet 3  . latanoprost (XALATAN) 0.005 % ophthalmic solution Place 1 drop into both eyes at bedtime.    Marland Kitchen loratadine (CLARITIN) 10 MG tablet Take 10 mg by mouth daily as needed for allergies.     Marland Kitchen MAGNESIUM CITRATE PO Take 250 mg by mouth daily.    . metolazone (ZAROXOLYN) 2.5 MG tablet Take 2.5 mg (1 tab) once as directed by CHF clinic. Call before taking [phone] 3 tablet 0  . metoprolol tartrate  (LOPRESSOR) 50 MG tablet TAKE ONE AND ONE-HALF TABLETS (75MG TOTAL) BY MOUTH TWICE DAILY 270 tablet 2  . moxifloxacin (VIGAMOX) 0.5 % ophthalmic solution Apply 1 drop to eye 4 (four) times daily.    . mupirocin cream (BACTROBAN) 2 % Apply 1 application topically 2 (two) times daily as needed. 90 g 0  . nitroGLYCERIN (NITROLINGUAL) 0.4 MG/SPRAY spray PLACE 1 SPRAY UNDER THE TONGUE EVERY 5 MINUTES FOR 3 DOSES AS NEEDED FOR CHEST PAIN. 14.7 g 0  . Polyethyl Glycol-Propyl Glycol (SYSTANE) 0.4-0.3 % SOLN Apply 1 drop to eye daily as needed (dry eyes).    . pravastatin (PRAVACHOL) 10 MG tablet TAKE ONE TABLET (10MG TOTAL) BY MOUTH DAILY 90 tablet 3  . prednisoLONE acetate (PRED FORTE) 1 % ophthalmic suspension 1 drop 4 (four) times daily.    Marland Kitchen rOPINIRole (REQUIP) 0.25 MG tablet Take 1 tablet (0.25 mg total) by mouth daily. Take two hours prior to bedtime every night 90 tablet 3  . silver nitrate applicators 09-73 % applicator Use as needed to stop superficial skin bleeding. 10 each 0  . spironolactone (ALDACTONE) 25 MG tablet Take 12.5 mg by mouth daily.    Marland Kitchen torsemide (DEMADEX) 20 MG tablet Take 2 tablets (40 mg total) by mouth daily. 180 tablet 3  . ULORIC 40 MG tablet Take 1 tablet (40 mg total) by mouth at bedtime. 90 tablet 3  . UNABLE TO FIND Med Name: So Clean CPAP cleaning system (Patient taking differently: Med Name: BiPap) 1 each 0  . VASCEPA 1 g capsule TAKE 2 CAPSULES BY MOUTH TWICE A DAY 360 capsule 3  . Wheat Dextrin (BENEFIBER) POWD Take 1 Dose by mouth daily.     No current facility-administered medications for this encounter.    Allergies:   Avandia [rosiglitazone maleate], Cephalexin, Clindamycin hcl, Humalog [insulin lispro], Lincomycin, Metformin, Novolog [insulin aspart], Penicillins, Rosiglitazone, Tizanidine, Adhesive [tape], Atorvastatin, Cholestyramine, Gentamycin [gentamicin], Iodinated diagnostic agents, Limonene, Pravastatin,  Prednisone, Ranexa [ranolazine], Rosuvastatin,  Simvastatin, Strawberry extract, Clopidogrel, Codeine, Erythromycin, Hydrocodone-acetaminophen, Latex, Neomycin, Nickel, Ranitidine, Tamiflu [oseltamivir], and Tramadol   Social History:  The patient  reports that she has never smoked. She has never used smokeless tobacco. She reports that she does not drink alcohol and does not use drugs.   Family History:  The patient's family history includes Breast cancer (age of onset: 25) in her sister; Cancer (age of onset: 22) in her sister; Cancer (age of onset: 30) in her maternal grandfather; Diabetes in her father; Glaucoma in her brother; Heart attack in her father, maternal grandmother, and mother; Heart disease in her father and mother; Hypertension in her brother; Lung cancer in her sister.   ROS:  Please see the history of present illness.   All other systems are personally reviewed and negative.   Exam:   BP 110/60   Pulse 72   Wt 52.6 kg (116 lb)   SpO2 99%   BMI 20.55 kg/m  General: NAD Neck: No JVD, no thyromegaly or thyroid nodule.  Lungs: Clear to auscultation bilaterally with normal respiratory effort. CV: Nondisplaced PMI.  Heart regular S1/S2, no S3/S4, 2/6 SEM RUSB.  No peripheral edema.  No carotid bruit.  Normal pedal pulses.  Abdomen: Soft, nontender, no hepatosplenomegaly, no distention.  Skin: Intact without lesions or rashes.  Neurologic: Alert and oriented x 3.  Psych: Normal affect. Extremities: No clubbing or cyanosis.  HEENT: Normal.   Recent Labs: 04/08/2019: B Natriuretic Peptide 111.8 04/13/2019: TSH 2.141 11/16/2019: ALT 24 02/24/2020: BUN 56; Creatinine, Ser 1.69; Hemoglobin 10.6; Platelets 116; Potassium 3.8; Sodium 139  Personally reviewed   Wt Readings from Last 3 Encounters:  02/24/20 52.6 kg (116 lb)  02/19/20 52.5 kg (115 lb 12.8 oz)  11/24/19 51.8 kg (114 lb 3.2 oz)    ASSESSMENT AND PLAN:  1. CAD: s/p SVG-RCA in 12/05, then redo SVG-RCA in 1/11-->distal left main/proximal LAD severe stenosis in  9/14. PCI with DES to distal LM/proximal LAD was done rather than bypass as she would have been a poor candidate for redo sternotomy (2 prior sternotomies and history of chest wall radiation for Hodgkins disease). Recurrent unstable angina with 80% ostial LM treated with repeat DES in 11/15.  Lexiscan Cardiolite in 9/18 inferior changes.  Repeat LHC (9/18) showed 50% distal left main in-stent restenosis and 75+% ostial LCx stenosis. I reviewed the films at the time with interventional cardiology => would be very difficult to intervene on the ostial LCx through 2 overlapping stents from left main into the LAD.  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 Given no ACS, we planned medical management and increased Imdur.  Cardiolite in 3/19 and again in 8/20 showed no ischemia or infarction.  Rare chest pain.  - Continue Brilinta 60 mg bid (poor responder to plavix) long-term given recurrent left main disease.  - Continue ASA 81, statin/Praluent/Vascepa.  - Continue Imdur 120 mg daily . - Decrease metoprolol to 50 mg bid with orthostatic-type symptoms and long 1st degree AVB.  - She was unable to tolerate ranolazine.  - With orthostatic-type symptoms, I will stop amlodipine.  2. Bioprosthetic aortic valve replacement: In setting of bicuspid aortic valve, thoracic aorta not dilated on 2017 TEE. Valve looked ok on 8/20 echo.  3. Chronic diastolic CHF: Restrictive cardiomyopathy.  Most recent echo in 8/20 showed normal LV systolic function, mildly dilated RV with normal systolic function, mild MS, moderate TR, normal bioprosthetic AoV, no  evidence for pericardial constriction. RHC in 8/20 was concerning for right > left heart failure, she did appear to have equalization of diastolic pressures concerning for restrictive cardiomyopathy, possibly related to prior radiation. CPX in 11/20 showed severe HF limitation.  Balance of renal failure and RV failure has been difficult.   Creatinine actually somewhat better recently.  She is mildly volume overloaded on exam today though not markedly. NYHA class II-III, stable.  Symptoms wax and wane. Weight stable.   - Continue torsemide 40 mg daily, check BMET today.   - Continue spironolactone 12.5 mg daily.  - I had her evaluated at [facility] for heart/kidney transplant for CKD and restrictive cardiomyopathy, but extensive vascular calcification makes her a poor surgical candidate.  - I will arrange for repeat echo at followup.  4. CKD: Patient has a single kidney s/p right nephrectomy and suspected diabetic nephropathy. CKD stage 4, slowly progressive.  Creatinine better recently, 1.86 was last check. I have been concerned for cardiorenal syndrome in the setting of RV > LV failure/restrictive cardiomyopathy.   - Not candidate for PD given prior abdominal surgery.  - If CKD progresses, she may be a candidate for home HD.  5. Hyperlipidemia: She has not been able to tolerate any higher potency statin than pravastatin 10 mg daily. Good lipids in 8/20.  - Continue pravastatin.  - Continue Repatha.   - She is on Vascepa for elevated TGs.   6. Anemia: Chronic/slow GI bleed. Unremarkable EGD and capsule endoscopy in fall 2014. Unfortunately, needs to stay on Brilinta long-term (Plavix non-responder). Rectal bleeding earlier this year thought to be diverticular, has now resolved.  - CBC today.  7. Atrial fibrillation: Paroxysmal, brief episodes noted on PCM interrogation. Given GI bleeding and need to be on Brilinta, risks have been thought to outweigh benefits of anticoagulation.  She is not a good Multaq candidate with CHF. If she needs an antiarrhythmic, consider Tikosyn or Sotalol but renal function may make these meds difficult to use.  No recent palpitations, NSR today.  - We were unable to place Watchman device due to nickel allergy.  8. Carotid bruit: Only mild stenosis on last dopplers in 3/18.  9. SSS: Pacemaker in place.    10. HTN: controlled. Continue current regimen.  11. OSA: Moderate, using Bipap.   12. ?TIA: Transient dysarthria in 3/18, could have been afib-related TIA. No recurrent symptoms.  13. Thrombocytopenia: Chronic and mild. Follows with hematology.  14. 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  Based on 8/20 RHC and echo it appears mild.  15. Tricuspid regurgitation: Moderate on 8/20 echo.   Followup in 6 wks with echo  Signed, [name], MD  02/25/2020  [facility] [address] [place] [address] [phone] (office) [phone] (fax)

## 2020-02-25 NOTE — Progress Notes (Signed)
Orders Placed This Encounter  Procedures  . ECHOCARDIOGRAM COMPLETE    Standing Status:   Future    Standing Expiration Date:   02/24/2021    Order Specific Question:   Where should this test be performed    Answer:   [facility]    Order Specific Question:   Perflutren DEFINITY (image enhancing agent) should be administered unless hypersensitivity or allergy exist    Answer:   Administer Perflutren    Order Specific Question:   Reason for exam-Echo    Answer:   Congestive Heart Failure  428.0 / I50.9    Order Specific Question:   Release to patient    Answer:   Immediate

## 2020-02-25 NOTE — Progress Notes (Signed)
Orthostatics Lying b/p 126/62 HR 71 Standing 110/62  HR 74

## 2020-02-29 ENCOUNTER — Telehealth: Payer: Self-pay | Admitting: Internal Medicine

## 2020-02-29 NOTE — Telephone Encounter (Signed)
I spoke with the pt and changed her transmission date to March 31, 2020.

## 2020-02-29 NOTE — Telephone Encounter (Signed)
New message    Patient left message on voicemail for a call back ,she just had her device checked with [name] , she doesn't think she should have to transmit again   1. Has your device fired? no  2. Is you device beeping? no  3. Are you experiencing draining or swelling at device site? no  4. Are you calling to see if we received your device transmission? No - she wants to change transmission date  5. Have you passed out? no    Please route to [place]

## 2020-03-01 ENCOUNTER — Telehealth (HOSPITAL_COMMUNITY): Payer: Self-pay | Admitting: *Deleted

## 2020-03-01 NOTE — Telephone Encounter (Signed)
She is very unlikely to get hypertensive from Prolia. Ok to take.

## 2020-03-01 NOTE — Telephone Encounter (Signed)
Pt left VM stating she is scheduled for a prolia injection this morning but after reading about the medication she is hesitant to receive injection. Pt states prolia can cause hypertension. Pt said she has a lot of heart problems and wants to make sure Dr.McLean is ok with her receiving injection.   Routed to [place] for advice

## 2020-03-02 ENCOUNTER — Ambulatory Visit: Payer: Medicare PPO | Admitting: Orthopaedic Surgery

## 2020-03-02 ENCOUNTER — Ambulatory Visit (INDEPENDENT_AMBULATORY_CARE_PROVIDER_SITE_OTHER): Payer: Medicare PPO

## 2020-03-02 ENCOUNTER — Encounter: Payer: Self-pay | Admitting: Orthopaedic Surgery

## 2020-03-02 ENCOUNTER — Encounter (HOSPITAL_COMMUNITY): Payer: Self-pay | Admitting: *Deleted

## 2020-03-02 VITALS — Ht 63.0 in | Wt 116.6 lb

## 2020-03-02 DIAGNOSIS — M25521 Pain in right elbow: Secondary | ICD-10-CM

## 2020-03-02 DIAGNOSIS — S46311A Strain of muscle, fascia and tendon of triceps, right arm, initial encounter: Secondary | ICD-10-CM

## 2020-03-02 DIAGNOSIS — M25511 Pain in right shoulder: Secondary | ICD-10-CM

## 2020-03-02 NOTE — Progress Notes (Signed)
Office Visit Note   Patient: [name] [name]           Date of Birth: 11-01-1950           MRN: [id number] Visit Date: 03/02/2020              Requested by: [name], MD [place],  [place] [address] PCP: [name], MD   Assessment & Plan: Visit Diagnoses:  1. Strain of right triceps, initial encounter   2. Acute pain of right shoulder   3. Pain in right elbow     Plan: Impression is right triceps muscle strain.  We have recommended relative rest, heat, compression sleeve and Voltaren gel as needed.  If she does not notice any improvement over the next 6 weeks she will come back in for follow-up.  Call with concerns or questions in the meantime.  Follow-Up Instructions: Return if symptoms worsen or fail to improve.   Orders:  Orders Placed This Encounter  Procedures   XR Elbow Complete Right (3+View)   XR Shoulder Right   No orders of the defined types were placed in this encounter.     Procedures: No procedures performed   Clinical Data: No additional findings.   Subjective: Chief Complaint  Patient presents with   Right Upper Arm - Pain    HPI patient is a pleasant 69 year old right-hand-dominant female who comes in today with concerns about her right elbow.  About 2 days ago, she was washing a 8 x 8 Pyrex dish when she externally rotated her shoulder as she was putting this on the drying rack.  She felt pain and a pop to the mid to distal triceps.  She has had an uncomfortable feeling and slight weakness since.  She comes in today for further evaluation and treat recommendation.  The pain she has is to the mid to distal triceps.  Worse when she is sleeping on her right side as well as when she is opening a car door and pushing it out.  She is unable to take Tylenol and over-the-counter anti-inflammatory so she has not taken anything for pain.  No numbness, tingling or burning.  Review of Systems as detailed in HPI.  All others  reviewed and are negative.   Objective: Vital Signs: Ht 5' 3"  (1.6 m)    Wt 116 lb 9.6 oz (52.9 kg)    BMI 20.65 kg/m   Physical Exam well-developed well-nourished female no acute distress.  Alert and oriented x3.  Ortho Exam examination of her right upper extremity reveals full extension and flexion of the elbow.  She does have slight pain with supination but is able to fully supinate.  Full pronation without pain.  She does have mild tenderness to the mid to distal triceps.  Slight weakness with resisted elbow extension.  No pain or weakness with resisted elbow flexion.  Near full range of motion of the shoulder without pain.  She is neurovascular intact distally.  Specialty Comments:  No specialty comments available.  Imaging: XR Elbow Complete Right (3+View)  Result Date: 03/02/2020 No acute or structural abnormalities  XR Shoulder Right  Result Date: 03/02/2020 No acute or structural abnormalities    PMFS History: Patient Active Problem List   Diagnosis Date Noted   Traumatic hematoma of right lower leg 06/12/2019   Right hip pain 04/14/2019   OSA on CPAP 04/24/2017   Genetic testing 01/14/2017   Insulin pump status 09/12/2016   S/P left  mastectomy 07/29/2016   History of breast cancer 03/29/2016   Dense breast 12/11/2015   Chronic anticoagulation 12/11/2015   Hx of radiation therapy 04/23/2015   Thrombocytopenia ([place]) 04/23/2015   Iron deficiency anemia secondary to blood loss (chronic) 04/23/2015   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   Radiation therapy complication 27/78/2423   History of nodular sclerosis Hodgkin's disease 10/27/2014   Insulin dependent type 1 diabetes mellitus ([place]) 10/07/2014   Claudication ([place]) 07/29/2014   PAF (paroxysmal atrial fibrillation) ([place])    Coronary artery disease involving native coronary artery of native heart with unstable angina pectoris ([place])    Unstable angina ([place]) 04/11/2013    Chronic diastolic CHF (congestive heart failure) ([place]) 04/03/2013   Artificial cardiac pacemaker 03/12/2013   Chronic kidney disease (CKD), stage III (moderate) 03/12/2013   Big thyroid 03/12/2013   Encounter for fitting or adjustment of insulin pump 03/12/2013   Congestive heart failure ([place]) 03/12/2013   Lipoma of neck 09/23/2012   Pacemaker 09/08/2012   Atrial flutter ([place]) 07/23/2012   Sick sinus syndrome ([place]) 07/21/2012   Gastroparesis due to DM ([place]) 05/01/2012   Hypercalcemia    Cholelithiasis 07/20/2011   Aortic stenosis status post 19 mm Edwards pericardial valve replacement 2011    Arteriosclerotic cardiovascular disease (ASCVD)    Osteopenia    NASH (nonalcoholic steatohepatitis)    Renal cell carcinoma (HCC)     Class: History of   Duodenal ulcer     Class: History of   Ischemic colitis     Class: History of   Gastroesophageal reflux disease    Hypertension    Small bowel obstruction ([place])    Hyperlipidemia 04/11/2010   Colonic polyps, adenomatous 09/29/2009   Past Medical History:  Diagnosis Date   Anxiety    Aortic stenosis    a. bicuspid aortic valve; mean gradient of 20 mmHg in 2/10; b. s/p bioprosth AVR (19-mm Edwards pericardial valve) with a redo coronary artery bypass graft procedure in 07/2009; postoperative Dressler's syndrome     Arthritis    Breast cancer ([place])    Carcinoma, renal cell ([name]) 05/2007   Laparoscopic right nephrectomy   Chromophilic renal cell carcinoma (HCC)    Chronic diastolic CHF (congestive heart failure) ([place])    a. 9.2014 EchoP EF 60-65%, no rwma, bioprosth AVR, mean gradient of 13mHg, mild to mod MR, PASP 59mg.   CKD (chronic kidney disease), stage II    Colitis, ischemic ([facility]   Coronary artery disease    a. initial CABG with SVG-RCA in 12/05. b. redo SVG-RCA and bioprosthetic AVR in 1/11. d. 9/14 - PCI with DES to distal LM/proximal LAD was done rather than bypass as she would have  been a poor candidate for redo sternotomy. d. S/p DES to prox LM 05/2014.   Diabetes mellitus 03/2010    TYPE II: Hemoglobin A1c of 7.4 in  03/2010; 8.4 in 06/2010; treated with insulin pump   Duodenal ulcer    Remote; H. pylori positive   Gastroparesis due to DM ([facility]10/04/2012   Genetic testing 01/14/2017   Ms. Lui underwent genetic counseling and testing for hereditary cancer syndromes on 01/01/2017. Her results were negative for mutations in all 83 genes analyzed by [facility]'s 83-gene Common Hereditary Cancers Panel. Genes analyzed include: ALK, APC, ATM, AXIN2, BAP1, BARD1, BLM, BMPR1A, BRCA1, BRCA2, BRIP1, CASR, CDC73, CDH1, CDK4, CDKN1B, CDKN1C, CDKN2A, CEBPA, CHEK2, CTNNA1, DICER1, DIS3L2,    Hodgkin's disease ([facility]1991   [place] radiation  therapy   Hodgkin's disease ([place])    Hyperlipidemia    Hyperplastic gastric polyp 06/23/2012   Hypertension    Iron deficiency anemia    a. 03/2013 EGD: essentially normal. Possible slow GIB.   Migraines    NASH (nonalcoholic steatohepatitis) 1999   -biopsy in 1999   OSA on CPAP 04/24/2017   Moderate with AHI 22/hr now on CPAP at 18cm H2o   Osteopenia     hip on DEXA in October 2007.   Osteoporosis    Oxygen deficiency    Pacemaker    six sinus syndrome   PAF (paroxysmal atrial fibrillation) ([place])    a.  h/o brief episodes noted on ppm interrogation. b. given GI bleeding and need to be on both ASA 81 and Brilinta, risks likely outweigh benefits of anticoagulation.    Renal insufficiency    Small bowel obstruction (HCC)    Recurrent; resolved after resection of a lipoma   Syncope    a. H/o recurrent syncope with pauses on loop recorder s/p Medtronic pacemaker 07/2012.    Family History  Problem Relation Age of Onset   Heart attack Mother        d.61   Heart disease Mother    Heart attack Father        d.68   Diabetes Father    Heart disease Father    Lung cancer Sister        d.61 metastases to brain.  History of smoking. Maternal half-sister.   Cancer Sister 30       unspecified gynecologic cancer (either cervical or ovarian) in her 43s.   Heart attack Maternal Grandmother    Cancer Maternal Grandfather 24       d.52s oral cancer   Hypertension Brother    Glaucoma Brother    Breast cancer Sister 67       d.42 from metastatic disease. Paternal half-sister.   Colon cancer Neg Hx    Stomach cancer Neg Hx     Past Surgical History:  Procedure Laterality Date   ABDOMINAL HYSTERECTOMY     AORTIC VALVE REPLACEMENT  2011   Aortic valve replacement surgery with a 19 mm bioprosthetic device post redo CABG January 2011.   BONE MARROW BIOPSY     BREAST EXCISIONAL BIOPSY     benign, bilateral   CARDIAC CATHETERIZATION     CERVICAL BIOPSY     COLONOSCOPY     Multiple with adenomatous polyps   COLONOSCOPY  06/23/2012   Procedure: COLONOSCOPY;  Surgeon: Gatha Mayer, MD;  Location: [facility] ENDOSCOPY;  Service: Endoscopy;  Laterality: N/A;   CORONARY ARTERY BYPASS GRAFT  2005, 2011   CABG-most recent SVG to RCA-12/05; RCA occluded with patent graft in 2006; Redo bypass in 07/2009   ESOPHAGOGASTRODUODENOSCOPY     ESOPHAGOGASTRODUODENOSCOPY  06/23/2012   Procedure: ESOPHAGOGASTRODUODENOSCOPY (EGD);  Surgeon: Gatha Mayer, MD;  Location: [facility];  Service: Endoscopy;  Laterality: N/A;   ESOPHAGOGASTRODUODENOSCOPY N/A 04/06/2013   Procedure: ESOPHAGOGASTRODUODENOSCOPY (EGD);  Surgeon: Irene Shipper, MD;  Location: [facility] ENDOSCOPY;  Service: Endoscopy;  Laterality: N/A;   EXPLORATORY LAPAROTOMY W/ BOWEL RESECTION     Small bowel resection of lipoma   GIVENS CAPSULE STUDY N/A 04/30/2013   Procedure: GIVENS CAPSULE STUDY;  Surgeon: Gatha Mayer, MD;  Location: [place] [facility];  Service: Endoscopy;  Laterality: N/A;   LEFT AND RIGHT HEART CATHETERIZATION WITH CORONARY ANGIOGRAM N/A 04/07/2013   Procedure: LEFT AND RIGHT HEART CATHETERIZATION WITH CORONARY ANGIOGRAM;  Surgeon: Larey Dresser, MD;  Location: [facility] CATH LAB;  Service: Cardiovascular;  Laterality: N/A;   LEFT ATRIAL APPENDAGE OCCLUSION N/A 02/16/2016   Watchman not placed - nickel allergy   LEFT HEART CATHETERIZATION WITH CORONARY/GRAFT ANGIOGRAM N/A 06/07/2014   Procedure: LEFT HEART CATHETERIZATION WITH Beatrix Fetters;  Surgeon: Burnell Blanks, MD;  Location[facility] LAB;  Service: Cardiovascular;  Laterality: N/A;   LIVER BIOPSY     LOOP RECORDER IMPLANT N/A 12/10/2011   Procedure: LOOP RECORDER IMPLANT;  Surgeon: Evans Lance, MD;  Location: [facility] CATH LAB;  Service: Cardiovascular;  Laterality: N/A;   MALONEY DILATION  06/23/2012   Procedure: [name] DILATION;  Surgeon: Gatha Mayer, MD;  Location: [facility] ENDOSCOPY;  Service: Endoscopy;;  70 fr   MASTECTOMY Left 05/17/2015   total    NEPHRECTOMY  2008   Laparoscopic right nephrectomy, renal cell carcinoma   PACEMAKER INSERTION  08/21/2012   Medtronic Adapta L dual-chamber pacemaker   PERCUTANEOUS CORONARY STENT INTERVENTION (PCI-S) N/A 04/09/2013   Procedure: PERCUTANEOUS CORONARY STENT INTERVENTION (PCI-S);  Surgeon: Burnell Blanks, MD;  Location: [facility] CATH LAB;  Service: Cardiovascular;  Laterality: N/A;   PERMANENT PACEMAKER INSERTION N/A 08/21/2012   Procedure: PERMANENT PACEMAKER INSERTION;  Surgeon: Evans Lance, MD;  Location: [facility] CATH LAB;  Service: Cardiovascular;  Laterality: N/A;   RIGHT HEART CATH N/A 03/18/2019   Procedure: RIGHT HEART CATH;  Surgeon: Larey Dresser, MD;  Location: [facility] CV LAB;  Service: Cardiovascular;  Laterality: N/A;   RIGHT HEART CATH AND CORONARY/GRAFT ANGIOGRAPHY N/A 04/19/2017   Procedure: RIGHT HEART CATH AND CORONARY/GRAFT ANGIOGRAPHY;  Surgeon: Larey Dresser, MD;  Location: [place] CV LAB;  Service: Cardiovascular;  Laterality: N/A;   TEE WITHOUT CARDIOVERSION N/A 12/07/2015   Procedure: TRANSESOPHAGEAL ECHOCARDIOGRAM (TEE);  Surgeon: Larey Dresser, MD;  Location: [facility];  Service: Cardiovascular;  Laterality: N/A;   TOTAL ABDOMINAL HYSTERECTOMY W/ BILATERAL SALPINGOOPHORECTOMY  2000   endometriosis and ovarian cysts   TOTAL MASTECTOMY Left 05/17/2015   Procedure: LEFT TOTAL MASTECTOMY;  Surgeon: Rolm Bookbinder, MD;  Location: [facility];  Service: General;  Laterality: Left;   TUMOR EXCISION  1981   removal of Hodgkins lymphoma   Social History   Occupational History   Occupation: retired    Comment: elementary principal  Tobacco Use   Smoking status: Never Smoker   Smokeless tobacco: Never Used  Scientific laboratory technician Use: Never used  Substance and Sexual Activity   Alcohol use: No    Alcohol/week: 0.0 standard drinks   Drug use: No   Sexual activity: Yes

## 2020-03-02 NOTE — Telephone Encounter (Signed)
Called pt no answer. Advice sent via mychart.

## 2020-03-08 ENCOUNTER — Ambulatory Visit: Payer: Medicare PPO | Admitting: Orthopaedic Surgery

## 2020-03-16 ENCOUNTER — Telehealth (HOSPITAL_COMMUNITY): Payer: Self-pay | Admitting: Cardiology

## 2020-03-16 ENCOUNTER — Telehealth: Payer: Self-pay | Admitting: Internal Medicine

## 2020-03-16 NOTE — Telephone Encounter (Signed)
Patient reports of heaviness in the top of chest. States she was walking into house which has 3-4 steps to go in, and could barely catch her breath. States she felt dizzy, ringing in ears, lasted 3-5 minutes. Reports of laying head down on her table and felt improvement after. Patient states she had these same episodes over the past 3 days. Patient reports hx of angina and takes nitro, has not taken any for recent episodes, states this pain doesn't feel the same as her regular pain. Patient denies any symptoms at present time.   Patient reports it is the same feeling prior to having her PPM. Reports of compliant with medications including amlodipine 2.5 mg daily, lopressor 50 mg 1& 1/2 tablets BID.  Manual transmission received and reviewed. No new episodes since 03/06/20. Previous episodes are known for patient AF.   Patient advised to call Dr. Claris Gladden office to let them know and see if they have any recommendations. Patient agrees and verbalizes understanding.   ED precautions given.

## 2020-03-16 NOTE — Telephone Encounter (Signed)
New message      1. Has your device fired? no  2. Is you device beeping? no  3. Are you experiencing draining or swelling at device site? no 4. Are you calling to see if we received your device transmission? no  5. Have you passed out? Having a lot of dizziness, sob, heaviness at the upper part of her chest , and then her ears start ringing , and she says she cant inhale  Cant walk from the car to the house without it feeling like the world is spinning     STAT if patient feels like he/she is going to faint   1) Are you dizzy now? No   2) Do you feel faint or have you passed out? Yes she feels faint every time she tries to walk   3) Do you have any other symptoms? Sob , ears ringing , heaviness in her chest -these are the symptoms she had before she had the pacemaker installed and it started again a couple weeks ago, could there be something wrong with her pacemaker?  4) Have you checked your HR and BP (record if available)? No

## 2020-03-16 NOTE — Telephone Encounter (Signed)
Would have her see how she is doing after she is back from vacation for a few days.  Nothing on device interrogation.

## 2020-03-16 NOTE — Telephone Encounter (Signed)
Pt aware and voiced understanding 

## 2020-03-16 NOTE — Telephone Encounter (Signed)
LMOM

## 2020-03-16 NOTE — Telephone Encounter (Signed)
Addressed in another phone note No changes for now per [name] Pt aware

## 2020-03-16 NOTE — Telephone Encounter (Signed)
Patient called to report that over the past 3-4 days while on vacation, she has been experiencing symptoms/episodes similar to before she had pace maker placed. Reports after minimal walking-she will have chest pressure/heaviness (denies pain), mild increase in SOB, and severe ringing in ears.   No recent medication changes however did take one extra dose of torsemide on 03/13/20 for LE edema B/p 03/16/20 122/68   Per Dr Corrie Dandy- monitor negative for events  Please advise

## 2020-03-16 NOTE — Telephone Encounter (Signed)
Pacemaker report reviewed.  No new episodes.  Pt advised to follow up with heart failure.  Will forward note.

## 2020-03-17 ENCOUNTER — Ambulatory Visit (INDEPENDENT_AMBULATORY_CARE_PROVIDER_SITE_OTHER): Payer: Medicare PPO | Admitting: *Deleted

## 2020-03-17 DIAGNOSIS — I495 Sick sinus syndrome: Secondary | ICD-10-CM | POA: Diagnosis not present

## 2020-03-17 LAB — CUP PACEART REMOTE DEVICE CHECK
Battery Impedance: 456 Ohm
Battery Remaining Longevity: 114 mo
Battery Voltage: 2.8 V
Brady Statistic AP VP Percent: 0 %
Brady Statistic AP VS Percent: 5 %
Brady Statistic AS VP Percent: 0 %
Brady Statistic AS VS Percent: 95 %
Date Time Interrogation Session: 20210825141853
Implantable Lead Implant Date: 20140130
Implantable Lead Implant Date: 20140130
Implantable Lead Location: 753859
Implantable Lead Location: 753860
Implantable Lead Model: 5076
Implantable Lead Model: 5076
Implantable Pulse Generator Implant Date: 20140130
Lead Channel Impedance Value: 454 Ohm
Lead Channel Impedance Value: 548 Ohm
Lead Channel Pacing Threshold Amplitude: 0.375 V
Lead Channel Pacing Threshold Amplitude: 0.625 V
Lead Channel Pacing Threshold Pulse Width: 0.4 ms
Lead Channel Pacing Threshold Pulse Width: 0.4 ms
Lead Channel Setting Pacing Amplitude: 1.5 V
Lead Channel Setting Pacing Amplitude: 2.5 V
Lead Channel Setting Pacing Pulse Width: 0.4 ms
Lead Channel Setting Sensing Sensitivity: 5.6 mV

## 2020-03-22 NOTE — Progress Notes (Signed)
Remote pacemaker transmission.   

## 2020-03-24 ENCOUNTER — Other Ambulatory Visit (HOSPITAL_COMMUNITY): Payer: Medicare PPO

## 2020-03-31 ENCOUNTER — Ambulatory Visit (HOSPITAL_COMMUNITY): Payer: Medicare PPO | Admitting: Hematology

## 2020-04-07 ENCOUNTER — Other Ambulatory Visit (HOSPITAL_COMMUNITY): Payer: Self-pay | Admitting: Cardiology

## 2020-04-16 DIAGNOSIS — R911 Solitary pulmonary nodule: Secondary | ICD-10-CM | POA: Insufficient documentation

## 2020-04-16 DIAGNOSIS — J90 Pleural effusion, not elsewhere classified: Secondary | ICD-10-CM | POA: Insufficient documentation

## 2020-04-20 ENCOUNTER — Other Ambulatory Visit: Payer: Self-pay

## 2020-04-20 ENCOUNTER — Encounter (HOSPITAL_COMMUNITY): Payer: Self-pay | Admitting: Cardiology

## 2020-04-20 ENCOUNTER — Ambulatory Visit (HOSPITAL_COMMUNITY)
Admission: RE | Admit: 2020-04-20 | Discharge: 2020-04-20 | Disposition: A | Discharge status: Home / Self Care | Payer: Medicare PPO | Source: Ambulatory Visit | Attending: Cardiology | Admitting: Cardiology

## 2020-04-20 ENCOUNTER — Ambulatory Visit (HOSPITAL_BASED_OUTPATIENT_CLINIC_OR_DEPARTMENT_OTHER)
Admission: RE | Admit: 2020-04-20 | Discharge: 2020-04-20 | Disposition: A | Discharge status: Home / Self Care | Payer: Medicare PPO | Source: Ambulatory Visit | Attending: Cardiology | Admitting: Cardiology

## 2020-04-20 VITALS — BP 136/62 | HR 74 | Ht 63.0 in | Wt 116.0 lb

## 2020-04-20 DIAGNOSIS — E1043 Type 1 diabetes mellitus with diabetic autonomic (poly)neuropathy: Secondary | ICD-10-CM | POA: Insufficient documentation

## 2020-04-20 DIAGNOSIS — I480 Paroxysmal atrial fibrillation: Secondary | ICD-10-CM

## 2020-04-20 DIAGNOSIS — Z9641 Presence of insulin pump (external) (internal): Secondary | ICD-10-CM | POA: Diagnosis not present

## 2020-04-20 DIAGNOSIS — M109 Gout, unspecified: Secondary | ICD-10-CM | POA: Insufficient documentation

## 2020-04-20 DIAGNOSIS — Q231 Congenital insufficiency of aortic valve: Secondary | ICD-10-CM | POA: Diagnosis not present

## 2020-04-20 DIAGNOSIS — I130 Hypertensive heart and chronic kidney disease with heart failure and stage 1 through stage 4 chronic kidney disease, or unspecified chronic kidney disease: Secondary | ICD-10-CM | POA: Insufficient documentation

## 2020-04-20 DIAGNOSIS — I2510 Atherosclerotic heart disease of native coronary artery without angina pectoris: Secondary | ICD-10-CM | POA: Diagnosis not present

## 2020-04-20 DIAGNOSIS — D509 Iron deficiency anemia, unspecified: Secondary | ICD-10-CM | POA: Insufficient documentation

## 2020-04-20 DIAGNOSIS — I6529 Occlusion and stenosis of unspecified carotid artery: Secondary | ICD-10-CM | POA: Diagnosis not present

## 2020-04-20 DIAGNOSIS — I5032 Chronic diastolic (congestive) heart failure: Secondary | ICD-10-CM | POA: Diagnosis not present

## 2020-04-20 DIAGNOSIS — N184 Chronic kidney disease, stage 4 (severe): Secondary | ICD-10-CM | POA: Insufficient documentation

## 2020-04-20 DIAGNOSIS — D696 Thrombocytopenia, unspecified: Secondary | ICD-10-CM | POA: Insufficient documentation

## 2020-04-20 DIAGNOSIS — E1022 Type 1 diabetes mellitus with diabetic chronic kidney disease: Secondary | ICD-10-CM | POA: Diagnosis not present

## 2020-04-20 DIAGNOSIS — Z880 Allergy status to penicillin: Secondary | ICD-10-CM | POA: Insufficient documentation

## 2020-04-20 DIAGNOSIS — G4733 Obstructive sleep apnea (adult) (pediatric): Secondary | ICD-10-CM | POA: Insufficient documentation

## 2020-04-20 DIAGNOSIS — Z885 Allergy status to narcotic agent status: Secondary | ICD-10-CM | POA: Insufficient documentation

## 2020-04-20 DIAGNOSIS — I25110 Atherosclerotic heart disease of native coronary artery with unstable angina pectoris: Secondary | ICD-10-CM | POA: Insufficient documentation

## 2020-04-20 DIAGNOSIS — Z7902 Long term (current) use of antithrombotics/antiplatelets: Secondary | ICD-10-CM | POA: Insufficient documentation

## 2020-04-20 DIAGNOSIS — Z881 Allergy status to other antibiotic agents status: Secondary | ICD-10-CM | POA: Insufficient documentation

## 2020-04-20 DIAGNOSIS — Z8249 Family history of ischemic heart disease and other diseases of the circulatory system: Secondary | ICD-10-CM | POA: Insufficient documentation

## 2020-04-20 DIAGNOSIS — I495 Sick sinus syndrome: Secondary | ICD-10-CM | POA: Diagnosis not present

## 2020-04-20 DIAGNOSIS — Z953 Presence of xenogenic heart valve: Secondary | ICD-10-CM | POA: Diagnosis not present

## 2020-04-20 DIAGNOSIS — Z85528 Personal history of other malignant neoplasm of kidney: Secondary | ICD-10-CM | POA: Insufficient documentation

## 2020-04-20 DIAGNOSIS — I052 Rheumatic mitral stenosis with insufficiency: Secondary | ICD-10-CM | POA: Insufficient documentation

## 2020-04-20 DIAGNOSIS — Z9013 Acquired absence of bilateral breasts and nipples: Secondary | ICD-10-CM | POA: Insufficient documentation

## 2020-04-20 DIAGNOSIS — Z91048 Other nonmedicinal substance allergy status: Secondary | ICD-10-CM | POA: Diagnosis not present

## 2020-04-20 DIAGNOSIS — Z955 Presence of coronary angioplasty implant and graft: Secondary | ICD-10-CM | POA: Diagnosis not present

## 2020-04-20 DIAGNOSIS — Z9989 Dependence on other enabling machines and devices: Secondary | ICD-10-CM | POA: Diagnosis not present

## 2020-04-20 DIAGNOSIS — Z923 Personal history of irradiation: Secondary | ICD-10-CM | POA: Insufficient documentation

## 2020-04-20 DIAGNOSIS — Z888 Allergy status to other drugs, medicaments and biological substances status: Secondary | ICD-10-CM | POA: Insufficient documentation

## 2020-04-20 DIAGNOSIS — I440 Atrioventricular block, first degree: Secondary | ICD-10-CM | POA: Insufficient documentation

## 2020-04-20 DIAGNOSIS — E785 Hyperlipidemia, unspecified: Secondary | ICD-10-CM | POA: Insufficient documentation

## 2020-04-20 DIAGNOSIS — Z950 Presence of cardiac pacemaker: Secondary | ICD-10-CM | POA: Insufficient documentation

## 2020-04-20 DIAGNOSIS — I425 Other restrictive cardiomyopathy: Secondary | ICD-10-CM | POA: Diagnosis not present

## 2020-04-20 DIAGNOSIS — Z90722 Acquired absence of ovaries, bilateral: Secondary | ICD-10-CM | POA: Insufficient documentation

## 2020-04-20 DIAGNOSIS — Z8571 Personal history of Hodgkin lymphoma: Secondary | ICD-10-CM | POA: Insufficient documentation

## 2020-04-20 DIAGNOSIS — Z794 Long term (current) use of insulin: Secondary | ICD-10-CM | POA: Insufficient documentation

## 2020-04-20 DIAGNOSIS — Z905 Acquired absence of kidney: Secondary | ICD-10-CM | POA: Diagnosis not present

## 2020-04-20 DIAGNOSIS — Z7982 Long term (current) use of aspirin: Secondary | ICD-10-CM | POA: Insufficient documentation

## 2020-04-20 DIAGNOSIS — Z79899 Other long term (current) drug therapy: Secondary | ICD-10-CM | POA: Insufficient documentation

## 2020-04-20 DIAGNOSIS — Z951 Presence of aortocoronary bypass graft: Secondary | ICD-10-CM | POA: Diagnosis not present

## 2020-04-20 LAB — BASIC METABOLIC PANEL
Anion gap: 11 (ref 5–15)
BUN: 57 mg/dL — ABNORMAL HIGH (ref 8–23)
CO2: 27 mmol/L (ref 22–32)
Calcium: 10.1 mg/dL (ref 8.9–10.3)
Chloride: 99 mmol/L (ref 98–111)
Creatinine, Ser: 1.68 mg/dL — ABNORMAL HIGH (ref 0.44–1.00)
GFR calc Af Amer: 36 mL/min — ABNORMAL LOW (ref >60)
GFR calc non Af Amer: 31 mL/min — ABNORMAL LOW (ref >60)
Glucose, Bld: 273 mg/dL — ABNORMAL HIGH (ref 70–99)
Potassium: 4.3 mmol/L (ref 3.5–5.1)
Sodium: 137 mmol/L (ref 135–145)

## 2020-04-20 LAB — ECHOCARDIOGRAM COMPLETE
AR max vel: 0.99 cm2
AV Area VTI: 1.32 cm2
AV Area mean vel: 0.96 cm2
AV Mean grad: 8 mmHg
AV Peak grad: 15 mmHg
Ao pk vel: 1.94 m/s
Area-P 1/2: 4.49 cm2
S' Lateral: 2.3 cm

## 2020-04-20 LAB — LIPID PANEL
Cholesterol: 109 mg/dL (ref 0–200)
HDL: 33 mg/dL — ABNORMAL LOW (ref >40)
LDL Cholesterol: 34 mg/dL (ref 0–99)
Total CHOL/HDL Ratio: 3.3 RATIO
Triglycerides: 209 mg/dL — ABNORMAL HIGH (ref <150)
VLDL: 42 mg/dL — ABNORMAL HIGH (ref 0–40)

## 2020-04-20 MED ORDER — METOPROLOL TARTRATE 50 MG PO TABS: 50.0000 mg | ORAL_TABLET | Freq: Two times a day (BID) | ORAL | 3 refills | Status: DC

## 2020-04-20 NOTE — Progress Notes (Signed)
Date:  04/20/2020   ID:  [name], [name] 09/08/1950, MRN [id number]   Provider location: [address], [place] Type of Visit: Established patient  PCP:  [name] Macadam, MD  Cardiologist:  No primary care provider on file. Primary HF: Dr. Aundra Dubin   History of Present Illness: [name] [name] is a 69 y.o. female who has a history of CAD s/p CABG in 12/05 and redo in 1/11, bioprosthetic AVR, diastolic CHF, and sick sinus syndrome s/p PCM presents for cardiology followup.  She had initial CABG with SVG-RCA in 12/05 followed by redo SVG-RCA and bioprosthetic AVR in 1/11.  She has had diastolic CHF and is on Lasix.  In 9/14, she had a CPX test.  This showed severe functional limitation with ischemic ECG changes (inferolateral ST depression and ST elevation in V1 and V2). She had chest pain.  She had R/LHC in 9/14.  This showed elevated left and right heart filling pressures, patent SVG-PDA, and 80% distal LM/80% ostial LAD stenosis.  She was not a candidate for redo CABG (had 2 prior sternotomies as well as chest radiation for Hodgkins lymphoma).  She had DES from left main into proximal LAD and will need long-term DAPT => Brilinta was used as she has been a poor responder to Plavix in the past.  She re-developed angina in 11/15 and was admitted with unstable angina.  LHC showed 80% ostial LM and patent SVG-RCA. She had DES to ostial LM. Echo in 1/18 showed EF 60-65%, normal bioprosthetic aortic valve, mild-moderate MR, PASP 35 mmHg.   She has chronic iron deficiency anemia thought to be due to a slow GI bleed.  Last EGD was unremarkable and a capsule endoscopy was also unremarkable.  She has had periodic transfusions.    She had bilateral mastectomy for DCIS in 29/52 without complication.  She has an insulin pump followed by an endocrinologist at [facility].   Given myalgias with statins, she was started on Praluent.  LDL is now excellent.    In the past she has had short  runs of atrial fibrillation.  She is not anticoagulated. Planned for Watchman placement, but given her nickel allergy, we were unable to place a Watchman.     Diagnosed with OSA, now on CPAP.   Given ongoing problems with exertional dyspnea and occasional chest pain, Cardiolite was repeated in 9/18 and showed inferior changes.  She underwent left/right heart cath in 9/18.  This showed elevated filling pressures, R>L.  There was 75+% ostial LCx stenosis.  However, there were 2 layers of stent from the mid left main into the ostial LAD, and LCx intervention was thought to be very risky.  It was decided to manage her medically.  Echo was repeated in 10/18 showing EF 55-60%, moderate to severe TR.    Given recurrent chest pain, she had ETT-Cardiolite done in 3/19.  This was a normal study with no evidence for ischemia or infarction.  Echo in 1/20 showed EF 55-60%, mild LVH, stable bioprosthetic aortic valve, moderate-severe TR.    Recently, she has been more short of breath with exertion and renal function has been worse.  She was set up for echo and RHC.  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  RHC in 8/20 showed primarily  right-sided HF.  Cardiolite in 8/20 showed no ischemia or infarction.  Torsemide was increased and she cut back on sodium in her diet.    CPX in 11/20 showed severe functional limitation from HF.   She saw Dr. Mosetta Pigeon at [facility] for evaluation for heart/kidney transplant (for restrictive cardiomyopathy).  She was determined to be a poor surgical candidate due to extensive vascular calcifications.   CT chest in 8/21 showed a loculated right pleural effusion but unable to find enough fluid by ultrasound for thoracentesis.   Echo was done today and  reviewed, EF 60-65% with mild LVH, mild RV dilation with mildly decreased systolic function and a D-shaped septum, PASP 40, normal bioprosthetic aortic valve, rheumatic-appearing MV with mild-moderate mitral stenosis (mean gradient 9 but MVA by PHT 2.7 cm^2), trivial MR.   She returns today for followup of CHF and CAD.  Weight stable. Using Bipap.  No lightheadedness now with discontinuation of amlodipine.  She remains short of breath walking up stairs and inclines but does ok on flat ground.  Generally feels good.  No orthopnea/PND.  No chest pain.    ECG (personally reviewed): NSR, long 1st degree AVB 406 msec, LVH  Labs (2/19): hgb 13.2 Labs @ [facility] (3/19): K 4.7, creatinine 1.4, ANA negative, RF negative Labs (4/19): K 3.9, creatinine 1.52 => 1.63 Labs (6/19): LDL 8, HDL 42, TGs 259 Labs (9/19): K 4.9, creatinine 1.65, hgb 13.4, plts 129, LDL 46,TGs 182 Labs (12/19): K 4.9, creatinine 2.07, hgb 12.4 Labs (3/20): K 4, creatinine 1.8 Labs (6/20): K 4.7, creatinine 1.47 Labs (8/20): creatinine 2.13 => 2.46 => 2.4, LDL 22 Labs (9/20): K 4.8, creatinine 2.74, AST/ALT normal, hgb 12.6 Labs (1/21): K 4.1, creatinine 1.86, hgb 11.7 Labs (4/21): K 4.7, creatinine 1.7 Labs (6/21): K 4.2, creatinine 2 => 1.7 Labs (8/21): K 3.8, creatinine 1.69, hgb 10.6, plts 116  PMH: 1. Sick sinus syndrome s/p [employer] PCM.  2. HTN 3. H/o SBO 4. Renal cell carcinoma s/p right nephrectomy in 2008.  5. 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  Fibroscan in 11/14 showed advanced fibrosis concerning for cirrhosis. EGD in 11/14 showed no varices.  6. Diastolic CHF: Echo (1/75) with EF 60-65%, bioprosthetic aortic valve with mean gradient 15 mmHg, mild MR.  CPX (9/14): peak VO2 10.4, VE/VCO2 43.8, inferolateral ST depression and ST elevation in V1/V2 with severe dyspnea and chest pain, severe functional limitation with ischemic changes.  Echo (9/14) with EF 60-65%, normal RV size and systolic function, mild-moderate  MR, bioprosthetic aortic valve normal, PA systolic pressure 51 mmHg, no evidence for pericardial constriction though exam incomplete for constriction.  Echo (11/15) with EF 55-60%, bioprosthetic aortic valve, mild-moderate MR, moderate TR, PA systolic pressure 42 mmHg.  - TEE (5/17) with EF 60-65%, normal bioprosthetic aortic valve with mean gradient 13 mmHg, normal RV size and systolic function. - Echo (1/18) with EF 60-65%, normal bioprosthetic aortic valve, mild-moderate MR, PASP 35 mmHg.  - Echo (10/18) with EF 55-60%, bioprosthetic aortic valve mean gradient 10 mmHg, MAC with mild mitral stenosis (mean gradient 7) and mild mitral regurgitation, moderate to severe TR, PASP 43 mmHg.  - RHC (9/18): mean RA 13, PA 44/18 mean 33, mean PCWP 19, CI 3.45, PVR 2.7 WU.  - Echo (1/20): EF 55-60%, mild LVH, mild MR, stable bioprosthetic aortic valve, moderate-severe TR.  - RHC (8/20): mean RA 14, PA 43/15 mean 28, mean PCWP 13, PAPI 2, PVR 4.2, CI 2.31 - Echo (8/20): EF 55-60%, RV  mildly dilated with normal systolic function, mildly D-shaped septum suggestive of RV pressure/volume overload, rheumatic-appearing mitral valve with mild mitral stenosis (mean gradient 6, MVA by PHT 4 cm^2), no evidence for pericardial constriction, moderate TR, bioprosthetic aortic valve functioning normally with mean gradient 6 mmHg.  - CPX (11/20): peak VO2 9.8, VE/VCO2 slope 38, RER 1.11 => severe functional limitation from HF.   - Echo (9/21): EF 60-65% with mild LVH, mild RV dilation with mildly decreased systolic function and a D-shaped septum, PASP 40, normal bioprosthetic aortic valve, rheumatic-appearing MV with mild-moderate mitral stenosis (mean gradient 9 but MVA by PHT 2.7 cm^2, trivial MR.  7. TAH-BSO 8. Type I diabetes: Has insulin pump.  9. H/o ischemic colitis. 10. H/o PUD. 11. Diabetic gastroparesis. 12. Hyperlipidemia: Myalgias with Crestor and atorvastatin, elevated LFTs with simvastatin.  Myalgias with > 10  mg pravastatin. Now on Praluent.  13. CKD stage IV: Sees Dr. Lorrene Reid 14. Bicuspid aortic valve s/p 19 mm Edwards pericardial valve in 1/11 (had post-operative Dresslers syndrome).   15. CAD: CABG with SVG-RCA in 12/05.  Redo SVG-RCA in 1/11 with AVR.  LHC/RHC (9/14) with mean RA 18, PA 62/25 mean 43, mean PCWP 26, CI 2.8, 70-80% distal LM stenosis, 80% ostial LAD stenosis, total occlusion RCA, SVG-PDA patent.  Patient had DES LM into LAD.  Plan to continue long-term ASA/Brilinta (poor responder to Plavix).  ETT-cardiolite (4/15) with 6' exercise, EF 60%, ST depression in recovery and mild chest pain, no ischemia or infarction by perfusion images.  Unstable angina 11/15 with 80% LM, patent SVG-RCA => DES to ostial LM.   - Lexiscan Cardiolite (2/18): EF 62%, no ischemia or infarction (normal).  She had a significant reaction to [place] and probably should not get again.  - Cardiolite (9/18): EF 72%, prior inferior infarct with mild peri-infarct ischemia.  - LHC (9/18): 2 layers stent left main - ostial LAD with 50% dLM in-stent restenosis, 75+% ostial LCx, totally occluded RCA, SVG-RCA patent.  Medical management planned.  - ETT-Cardiolite (4/19): EF normal, 7'11" exercise, no ischemia or infarction.  - Lexiscan Cardiolite (8/20): No ischemia/infarction.  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  17. Anemia: Iron-deficiency.  Suspect chronic GI blood loss.  EGD in 9/14 was unremarkable.  Capsule endoscopy 10/14 was unremarkable.  Has required periodic blood transfusions.  18. Atrial fibrillation: Paroxysmal, noted by [facility] interrogation.  19. Carotid stenosis: Carotid dopplers (2/15) with 40-59% bilateral stenosis. Carotid dopplers (3/16) with 40-59% bilateral ICA stenosis. Carotid dopplers (3/17) with 40-59% bilateral ICA stenosis.  - Carotid dopplers (3/18) with mild BICA stenosis.  20. 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  22. C difficile colitis 2017 23. Gout 24. ?TIA: head CT negative.  25. OSA: Moderate, uses CPAP.  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 Hypercalcemia 28. 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  Mild-moderate on 9/21 echo.  29. Tricuspid regurgitation: Moderate by 8/20 echo.   Current Outpatient Medications  Medication Sig Dispense Refill  . acyclovir ointment (ZOVIRAX) 5 % Apply 1 application topically every 3 (three) hours as needed (fever blister/cold sores).    . ALPRAZolam (XANAX) 0.5 MG tablet Take 1 tablet (0.5 mg total) by mouth 2 (two) times daily as needed for anxiety. 60 tablet 1  . aspirin EC 81 MG tablet Take 81 mg by mouth at bedtime.    Marland Kitchen BRILINTA  60 MG TABS tablet TAKE ONE TABLET (60MG TOTAL) BY MOUTH TWICE DAILY 180 tablet 3  . clobetasol (OLUX) 0.05 % topical foam Apply 1 application topically 2 (two) times daily as needed (skin flaking).    . Coenzyme Q10 (CO Q 10) 100 MG CAPS Take 100 mg by mouth at bedtime.     . colchicine 0.6 MG tablet Take 0.6 mg by mouth daily as needed (gout flare).    . CONTOUR NEXT TEST test strip USE TO TEST BLOOD SUGAR UP TO EIGHT TIMES A DAY    . diclofenac Sodium (VOLTAREN) 1 % GEL as needed.    . docusate sodium (COLACE) 100 MG capsule Take 100 mg by mouth 2 (two) times daily.     . Evolocumab (REPATHA SURECLICK) 188 MG/ML SOAJ Inject 140 mg into the skin every 14 (fourteen) days. 6 pen 3  . isosorbide mononitrate (IMDUR) 120 MG 24 hr tablet TAKE ONE TABLET (120MG TOTAL) BY MOUTH DAILY 90 tablet 3  . latanoprost (XALATAN) 0.005 % ophthalmic solution Place 1 drop into both eyes at bedtime.    Marland Kitchen MAGNESIUM CITRATE PO Take 250 mg by mouth daily.    . metolazone (ZAROXOLYN) 2.5 MG tablet Take 2.5 mg (1 tab) once as directed by CHF clinic. Call before taking [phone] 3 tablet 0  . metoprolol tartrate (LOPRESSOR) 50 MG tablet Take 1  tablet (50 mg total) by mouth 2 (two) times daily. 60 tablet 3  . nitroGLYCERIN (NITROLINGUAL) 0.4 MG/SPRAY spray PLACE 1 SPRAY UNDER THE TONGUE EVERY 5 MINUTES FOR 3 DOSES AS NEEDED FOR CHEST PAIN. 14.7 g 0  . pravastatin (PRAVACHOL) 10 MG tablet TAKE ONE TABLET (10 MG TOTAL) BY MOUTH DAILY. 90 tablet 3  . rOPINIRole (REQUIP) 0.25 MG tablet Take 1 tablet (0.25 mg total) by mouth daily. Take two hours prior to bedtime every night 90 tablet 3  . silver nitrate applicators 01-09 % applicator Use as needed to stop superficial skin bleeding. 10 each 0  . spironolactone (ALDACTONE) 25 MG tablet Take 12.5 mg by mouth daily.    Marland Kitchen torsemide (DEMADEX) 20 MG tablet Take 2 tablets (40 mg total) by mouth daily. 180 tablet 3  . ULORIC 40 MG tablet Take 1 tablet (40 mg total) by mouth at bedtime. 90 tablet 3  . UNABLE TO FIND Med Name: So Clean CPAP cleaning system (Patient taking differently: Med Name: BiPap) 1 each 0  . VASCEPA 1 g capsule TAKE 2 CAPSULES BY MOUTH TWICE A DAY 360 capsule 3  . APIDRA 100 UNIT/ML injection Inject 40 Units into the skin as directed. INFUSE THROUGH INSULIN PUMP UTD, Bolus depends on carb intake  2  . famotidine (PEPCID) 40 MG tablet Take 40 mg by mouth daily.    Marland Kitchen glucagon (GLUCAGON EMERGENCY) 1 MG injection Inject 1 mg into the vein once as needed (low blood sugar).     . mupirocin cream (BACTROBAN) 2 % Apply 1 application topically 2 (two) times daily as needed. (Patient not taking: Reported on 04/20/2020) 90 g 0  . Wheat Dextrin (BENEFIBER) POWD Take 1 Dose by mouth daily. (Patient not taking: Reported on 04/20/2020)     No current facility-administered medications for this encounter.    Allergies:   Avandia [rosiglitazone maleate], Cephalexin, Clindamycin hcl, Humalog [insulin lispro], Lincomycin, Metformin, Novolog [insulin aspart], Penicillins, Rosiglitazone, Tizanidine, Adhesive [tape], Atorvastatin, Cholestyramine, Gentamycin [gentamicin], Iodinated diagnostic agents,  Limonene, Pravastatin, Prednisone, Ranexa [ranolazine], Rosuvastatin, Simvastatin, Strawberry extract, Clopidogrel, Codeine, Erythromycin, Hydrocodone-acetaminophen, Latex, Neomycin,  Nickel, Ranitidine, Tamiflu [oseltamivir], and Tramadol   Social History:  The patient  reports that she has never smoked. She has never used smokeless tobacco. She reports that she does not drink alcohol and does not use drugs.   Family History:  The patient's family history includes Breast cancer (age of onset: 28) in her sister; Cancer (age of onset: 37) in her sister; Cancer (age of onset: 85) in her maternal grandfather; Diabetes in her father; Glaucoma in her brother; Heart attack in her father, maternal grandmother, and mother; Heart disease in her father and mother; Hypertension in her brother; Lung cancer in her sister.   ROS:  Please see the history of present illness.   All other systems are personally reviewed and negative.   Exam:   BP 136/62 (BP Location: Left Arm, Patient Position: Sitting, Cuff Size: Normal)   Pulse 74   Ht _0  (1.6 m)   Wt 52.6 kg (116 lb)   SpO2 98%   BMI 20.55 kg/m  General: NAD Neck: No JVD, no thyromegaly or thyroid nodule.  Lungs: Clear to auscultation bilaterally with normal respiratory effort. CV: Nondisplaced PMI.  Heart regular S1/S2, no S3/S4, 2/6 SEM RUSB with clear S2.  No peripheral edema.  No carotid bruit.  Normal pedal pulses.  Abdomen: Soft, nontender, no hepatosplenomegaly, no distention.  Skin: Intact without lesions or rashes.  Neurologic: Alert and oriented x 3.  Psych: Normal affect. Extremities: No clubbing or cyanosis.  HEENT: Normal.   Recent Labs: 11/16/2019: ALT 24 02/24/2020: Hemoglobin 10.6; Platelets 116 04/20/2020: BUN 57; Creatinine, Ser 1.68; Potassium 4.3; Sodium 137  Personally reviewed   Wt Readings from Last 3 Encounters:  04/20/20 52.6 kg (116 lb)  03/02/20 52.9 kg (116 lb 9.6 oz)  02/24/20 52.6 kg (116 lb)    ASSESSMENT AND  PLAN:  1. CAD: s/p SVG-RCA in 12/05, then redo SVG-RCA in 1/11-->distal left main/proximal LAD severe stenosis in 9/14. PCI with DES to distal LM/proximal LAD was done rather than bypass as she would have been a poor candidate for redo sternotomy (2 prior sternotomies and history of chest wall radiation for Hodgkins disease). Recurrent unstable angina with 80% ostial LM treated with repeat DES in 11/15.  Lexiscan Cardiolite in 9/18 inferior changes.  Repeat LHC (9/18) showed 50% distal left main in-stent restenosis and 75+% ostial LCx stenosis. I reviewed the films at the time with interventional cardiology => would be very difficult to intervene on the ostial LCx through 2 overlapping stents from left main into the LAD.  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 Given no ACS, we planned medical management and increased Imdur.  Cardiolite in 3/19 and again in 8/20 showed no ischemia or infarction.  No recent chest pain.  - Continue Brilinta 60 mg bid (poor responder to plavix) long-term given recurrent left main disease.  - Continue ASA 81, statin/Praluent/Vascepa.  - Continue Imdur 120 mg daily . - Continue metoprolol 50 mg bid but may need to decrease/stop if she develops more advanced heart block (currently long 1st degree AVB).   - She was unable to tolerate ranolazine.  2. Bioprosthetic aortic valve replacement: In setting of bicuspid aortic valve, thoracic aorta not dilated on 2017 TEE. Valve looked ok on 9/21 echo.  3. Chronic diastolic CHF: Restrictive cardiomyopathy.  Most recent echo in 9/21 shows normal LV systolic function, mildly dilated RV with mildly decreased systolic function, mild-moderate MS, normal bioprosthetic AoV, no evidence for pericardial  constriction. RHC in 8/20 was concerning for right > left heart failure, she did appear to have equalization of diastolic pressures concerning for restrictive cardiomyopathy, possibly related to prior  radiation. CPX in 11/20 showed severe HF limitation.  Balance of renal failure and RV failure has been difficult.  Creatinine seems to have stabilized in the 1.6-1.7 range.  She is not volume overloaded on exam today.  NYHA class II-III, stable.  Weight is stable.   - Continue torsemide 40 mg daily, check BMET today.   - Continue spironolactone 12.5 mg daily.  - I had her evaluated at [facility] for heart/kidney transplant for CKD and restrictive cardiomyopathy, but extensive vascular calcification makes her a poor surgical candidate.  4. CKD: Patient has a single kidney s/p right nephrectomy and suspected diabetic nephropathy. CKD stage 4, slowly progressive.  Creatinine better recently, 1.69 was last check. I have been concerned for cardiorenal syndrome in the setting of RV > LV failure/restrictive cardiomyopathy.   - Not candidate for PD given prior abdominal surgery.  - If CKD progresses, she may be a candidate for home HD.  5. Hyperlipidemia: She has not been able to tolerate any higher potency statin than pravastatin 10 mg daily.   - Continue pravastatin.  - Continue Repatha.   - She is on Vascepa for elevated TGs.  - Check lipids.   6. Anemia: Chronic/slow GI bleed. Unremarkable EGD and capsule endoscopy in fall 2014. Unfortunately, needs to stay on Brilinta long-term (Plavix non-responder). Rectal bleeding earlier this year thought to be diverticular, has now resolved.  7. Atrial fibrillation: Paroxysmal, brief episodes noted on PCM interrogation. Given GI bleeding and need to be on Brilinta, risks have been thought to outweigh benefits of anticoagulation.  She is not a good Multaq candidate with CHF. If she needs an antiarrhythmic, consider Tikosyn or Sotalol but renal function may make these meds difficult to use.  No recent palpitations, NSR today.  - We were unable to place Watchman device due to nickel allergy.  8. Carotid bruit: Only mild stenosis on last dopplers in 3/18.  9. SSS:  Pacemaker in place.  10. HTN: controlled. Continue current regimen.  11. OSA: Moderate, using Bipap.   12. ?TIA: Transient dysarthria in 3/18, could have been afib-related TIA. No recurrent symptoms.  13. Thrombocytopenia: Chronic and mild. Follows with hematology.  14. 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  Based on 8/20 RHC and 9/21 echo it appears mild-moderate.  She would not be a candidate for valve replacement.   Followup in 3 months.   Signed, [name], MD  04/20/2020  [facility] [address] [facility] [place] [address] [phone] (office) [phone] (fax)

## 2020-04-20 NOTE — Progress Notes (Signed)
  Echocardiogram 2D Echocardiogram has been performed.  [name] [name] 04/20/2020, 8:51 AM

## 2020-04-20 NOTE — Patient Instructions (Signed)
CONTINUE Metoprolol 50 mg one tab twice a day  Labs today We will only contact you if something comes back abnormal or we need to make some changes. Otherwise no news is good news!  Your physician recommends that you schedule a follow-up appointment in: 4 months with [name]   If you have any questions or concerns before your next appointment please send [name] a message through [place] or call our office at [phone].    TO LEAVE A MESSAGE FOR THE NURSE SELECT OPTION 2, PLEASE LEAVE A MESSAGE INCLUDING: . YOUR NAME . DATE OF BIRTH . CALL BACK NUMBER . REASON FOR CALL**this is important as we prioritize the call backs  YOU WILL RECEIVE A CALL BACK THE SAME DAY AS LONG AS YOU CALL BEFORE 4:00 PM

## 2020-04-25 ENCOUNTER — Inpatient Hospital Stay (HOSPITAL_COMMUNITY): Payer: Medicare PPO | Attending: Hematology

## 2020-04-25 ENCOUNTER — Other Ambulatory Visit: Payer: Self-pay

## 2020-04-25 DIAGNOSIS — I480 Paroxysmal atrial fibrillation: Secondary | ICD-10-CM | POA: Diagnosis not present

## 2020-04-25 DIAGNOSIS — Q231 Congenital insufficiency of aortic valve: Secondary | ICD-10-CM | POA: Insufficient documentation

## 2020-04-25 DIAGNOSIS — E785 Hyperlipidemia, unspecified: Secondary | ICD-10-CM | POA: Diagnosis not present

## 2020-04-25 DIAGNOSIS — R002 Palpitations: Secondary | ICD-10-CM | POA: Diagnosis not present

## 2020-04-25 DIAGNOSIS — R0602 Shortness of breath: Secondary | ICD-10-CM | POA: Diagnosis not present

## 2020-04-25 DIAGNOSIS — Z8571 Personal history of Hodgkin lymphoma: Secondary | ICD-10-CM | POA: Diagnosis not present

## 2020-04-25 DIAGNOSIS — I10 Essential (primary) hypertension: Secondary | ICD-10-CM | POA: Diagnosis not present

## 2020-04-25 DIAGNOSIS — E1143 Type 2 diabetes mellitus with diabetic autonomic (poly)neuropathy: Secondary | ICD-10-CM | POA: Insufficient documentation

## 2020-04-25 DIAGNOSIS — M810 Age-related osteoporosis without current pathological fracture: Secondary | ICD-10-CM | POA: Diagnosis not present

## 2020-04-25 DIAGNOSIS — K5900 Constipation, unspecified: Secondary | ICD-10-CM | POA: Diagnosis not present

## 2020-04-25 DIAGNOSIS — I25810 Atherosclerosis of coronary artery bypass graft(s) without angina pectoris: Secondary | ICD-10-CM | POA: Diagnosis not present

## 2020-04-25 DIAGNOSIS — Z79899 Other long term (current) drug therapy: Secondary | ICD-10-CM | POA: Diagnosis not present

## 2020-04-25 DIAGNOSIS — Z85528 Personal history of other malignant neoplasm of kidney: Secondary | ICD-10-CM | POA: Insufficient documentation

## 2020-04-25 DIAGNOSIS — I350 Nonrheumatic aortic (valve) stenosis: Secondary | ICD-10-CM | POA: Insufficient documentation

## 2020-04-25 DIAGNOSIS — G479 Sleep disorder, unspecified: Secondary | ICD-10-CM | POA: Diagnosis not present

## 2020-04-25 DIAGNOSIS — R197 Diarrhea, unspecified: Secondary | ICD-10-CM | POA: Insufficient documentation

## 2020-04-25 DIAGNOSIS — R5383 Other fatigue: Secondary | ICD-10-CM | POA: Insufficient documentation

## 2020-04-25 DIAGNOSIS — N182 Chronic kidney disease, stage 2 (mild): Secondary | ICD-10-CM | POA: Diagnosis not present

## 2020-04-25 DIAGNOSIS — D696 Thrombocytopenia, unspecified: Secondary | ICD-10-CM | POA: Diagnosis not present

## 2020-04-25 DIAGNOSIS — Z853 Personal history of malignant neoplasm of breast: Secondary | ICD-10-CM | POA: Diagnosis not present

## 2020-04-25 DIAGNOSIS — I5032 Chronic diastolic (congestive) heart failure: Secondary | ICD-10-CM | POA: Diagnosis not present

## 2020-04-25 DIAGNOSIS — K3184 Gastroparesis: Secondary | ICD-10-CM | POA: Insufficient documentation

## 2020-04-25 LAB — CBC WITH DIFFERENTIAL/PLATELET
Abs Immature Granulocytes: 0.04 10*3/uL (ref 0.00–0.07)
Basophils Absolute: 0.1 10*3/uL (ref 0.0–0.1)
Basophils Relative: 1 %
Eosinophils Absolute: 0.1 10*3/uL (ref 0.0–0.5)
Eosinophils Relative: 2 %
HCT: 38 % (ref 36.0–46.0)
Hemoglobin: 12.1 g/dL (ref 12.0–15.0)
Immature Granulocytes: 1 %
Lymphocytes Relative: 18 %
Lymphs Abs: 1.5 10*3/uL (ref 0.7–4.0)
MCH: 28.9 pg (ref 26.0–34.0)
MCHC: 31.8 g/dL (ref 30.0–36.0)
MCV: 90.9 fL (ref 80.0–100.0)
Monocytes Absolute: 0.5 10*3/uL (ref 0.1–1.0)
Monocytes Relative: 6 %
Neutro Abs: 6.2 10*3/uL (ref 1.7–7.7)
Neutrophils Relative %: 72 %
Platelets: 124 10*3/uL — ABNORMAL LOW (ref 150–400)
RBC: 4.18 MIL/uL (ref 3.87–5.11)
RDW: 13.3 % (ref 11.5–15.5)
WBC: 8.4 10*3/uL (ref 4.0–10.5)
nRBC: 0 % (ref 0.0–0.2)

## 2020-04-25 LAB — COMPREHENSIVE METABOLIC PANEL
ALT: 19 U/L (ref 0–44)
AST: 28 U/L (ref 15–41)
Albumin: 4.3 g/dL (ref 3.5–5.0)
Alkaline Phosphatase: 113 U/L (ref 38–126)
Anion gap: 10 (ref 5–15)
BUN: 62 mg/dL — ABNORMAL HIGH (ref 8–23)
CO2: 26 mmol/L (ref 22–32)
Calcium: 10 mg/dL (ref 8.9–10.3)
Chloride: 100 mmol/L (ref 98–111)
Creatinine, Ser: 1.77 mg/dL — ABNORMAL HIGH (ref 0.44–1.00)
GFR calc Af Amer: 34 mL/min — ABNORMAL LOW (ref >60)
GFR calc non Af Amer: 29 mL/min — ABNORMAL LOW (ref >60)
Glucose, Bld: 210 mg/dL — ABNORMAL HIGH (ref 70–99)
Potassium: 4.8 mmol/L (ref 3.5–5.1)
Sodium: 136 mmol/L (ref 135–145)
Total Bilirubin: 0.8 mg/dL (ref 0.3–1.2)
Total Protein: 8 g/dL (ref 6.5–8.1)

## 2020-04-25 LAB — LACTATE DEHYDROGENASE: LDH: 137 U/L (ref 98–192)

## 2020-04-26 LAB — PROTEIN ELECTROPHORESIS, SERUM
A/G Ratio: 1.1 (ref 0.7–1.7)
Albumin ELP: 4.1 g/dL (ref 2.9–4.4)
Alpha-1-Globulin: 0.2 g/dL (ref 0.0–0.4)
Alpha-2-Globulin: 0.8 g/dL (ref 0.4–1.0)
Beta Globulin: 1.2 g/dL (ref 0.7–1.3)
Gamma Globulin: 1.6 g/dL (ref 0.4–1.8)
Globulin, Total: 3.8 g/dL (ref 2.2–3.9)
Total Protein ELP: 7.9 g/dL (ref 6.0–8.5)

## 2020-04-26 LAB — KAPPA/LAMBDA LIGHT CHAINS
Kappa free light chain: 84.1 mg/L — ABNORMAL HIGH (ref 3.3–19.4)
Kappa, lambda light chain ratio: 2.25 — ABNORMAL HIGH (ref 0.26–1.65)
Lambda free light chains: 37.3 mg/L — ABNORMAL HIGH (ref 5.7–26.3)

## 2020-04-27 LAB — IMMUNOFIXATION ELECTROPHORESIS
IgA: 346 mg/dL (ref 87–352)
IgG (Immunoglobin G), Serum: 1421 mg/dL (ref 586–1602)
IgM (Immunoglobulin M), Srm: 244 mg/dL — ABNORMAL HIGH (ref 26–217)
Total Protein ELP: 7.8 g/dL (ref 6.0–8.5)

## 2020-04-28 DIAGNOSIS — J383 Other diseases of vocal cords: Secondary | ICD-10-CM | POA: Diagnosis not present

## 2020-04-28 DIAGNOSIS — R490 Dysphonia: Secondary | ICD-10-CM | POA: Diagnosis not present

## 2020-04-29 DIAGNOSIS — G4731 Primary central sleep apnea: Secondary | ICD-10-CM | POA: Diagnosis not present

## 2020-05-02 ENCOUNTER — Other Ambulatory Visit: Payer: Self-pay

## 2020-05-02 ENCOUNTER — Inpatient Hospital Stay (HOSPITAL_COMMUNITY): Payer: Medicare PPO | Admitting: Hematology

## 2020-05-02 VITALS — BP 138/72 | HR 71 | Temp 96.8°F | Resp 16 | Wt 115.3 lb

## 2020-05-02 DIAGNOSIS — R0602 Shortness of breath: Secondary | ICD-10-CM | POA: Diagnosis not present

## 2020-05-02 DIAGNOSIS — Z85528 Personal history of other malignant neoplasm of kidney: Secondary | ICD-10-CM | POA: Diagnosis not present

## 2020-05-02 DIAGNOSIS — K5900 Constipation, unspecified: Secondary | ICD-10-CM | POA: Diagnosis not present

## 2020-05-02 DIAGNOSIS — R5383 Other fatigue: Secondary | ICD-10-CM | POA: Diagnosis not present

## 2020-05-02 DIAGNOSIS — D696 Thrombocytopenia, unspecified: Secondary | ICD-10-CM | POA: Diagnosis not present

## 2020-05-02 DIAGNOSIS — Z853 Personal history of malignant neoplasm of breast: Secondary | ICD-10-CM | POA: Diagnosis not present

## 2020-05-02 DIAGNOSIS — R197 Diarrhea, unspecified: Secondary | ICD-10-CM | POA: Diagnosis not present

## 2020-05-02 DIAGNOSIS — R002 Palpitations: Secondary | ICD-10-CM | POA: Diagnosis not present

## 2020-05-02 NOTE — Progress Notes (Signed)
[place], [place] [address]   CLINIC:  Medical Oncology/Hematology  PCP:  [name], [place] / [place] [address]  [phone]  REASON FOR VISIT:  Follow-up for thrombocytopenia  PRIOR THERAPY: None  CURRENT THERAPY: Observation  INTERVAL HISTORY:  Ms. GABRYELA KIMBRELL, a 69 y.o. female, returns for routine follow-up for her thrombocytopenia. Blonnie was last seen on 11/23/2019.  Today she reports feeling okay. She had a right mammogram on August 04, 2019 and had 3 cysts drained.   REVIEW OF SYSTEMS:  Review of Systems  Constitutional: Positive for fatigue (75%). Negative for appetite change.  HENT:   Positive for trouble swallowing (pills & bread).   Respiratory: Positive for shortness of breath (w/ exertion).   Cardiovascular: Positive for palpitations.  Gastrointestinal: Positive for constipation and diarrhea.  Neurological: Positive for numbness (R hand & feet).  Psychiatric/Behavioral: Positive for sleep disturbance. The patient is nervous/anxious.   All other systems reviewed and are negative.   PAST MEDICAL/SURGICAL HISTORY:  Past Medical History:  Diagnosis Date  . Anxiety   . Aortic stenosis    a. bicuspid aortic valve; mean gradient of 20 mmHg in 2/10; b. s/p bioprosth AVR (19-mm Edwards pericardial valve) with a redo coronary artery bypass graft procedure in 07/2009; postoperative Dressler's syndrome    . Arthritis   . Breast cancer ([place])   . Carcinoma, renal cell ([place]) 05/2007   Laparoscopic right nephrectomy  . 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 Chronic diastolic CHF (congestive heart failure) ([place])    a. 9.2014 EchoP EF 60-65%, no rwma, bioprosth AVR, mean gradient of 40mHg, mild to mod MR, PASP 58mg.  . CKD (chronic kidney disease), stage II   . Colitis, ischemic ([facility]  . Coronary artery disease    a. initial CABG with SVG-RCA in 12/05. b. redo SVG-RCA and bioprosthetic  AVR in 1/11. d. 9/14 - PCI with DES to distal LM/proximal LAD was done rather than bypass as she would have been a poor candidate for redo sternotomy. d. S/p DES to prox LM 05/2014.  . Diabetes mellitus 03/2010    TYPE II: Hemoglobin A1c of 7.4 in  03/2010; 8.4 in 06/2010; treated with insulin pump  . Duodenal ulcer    Remote; H. pylori positive  . Gastroparesis due to DM ([facility]10/04/2012  . Genetic testing 01/14/2017   Ms. Monter underwent genetic counseling and testing for hereditary cancer syndromes on 01/01/2017. Her results were negative for mutations in all 83 genes analyzed by [facility]'s 83-gene Common Hereditary Cancers Panel. Genes analyzed include: ALK, APC, ATM, AXIN2, BAP1, BARD1, BLM, BMPR1A, BRCA1, BRCA2, BRIP1, CASR, CDC73, CDH1, CDK4, CDKN1B, CDKN1C, CDKN2A, CEBPA, CHEK2, CTNNA1, DICER1, DIS3L2,   . Hodgkin's disease ([facility]1991   [place]le radiation therapy  . Hodgkin's disease ([facility]  . Hyperlipidemia   . Hyperplastic gastric polyp 06/23/2012  . Hypertension   . Iron deficiency anemia    a. 03/2013 EGD: essentially normal. Possible slow GIB.  . Migraines   . NASH (nonalcoholic steatohepatitis) 1999   -biopsy in 1999  . OSA on CPAP 04/24/2017   Moderate with AHI 22/hr now on CPAP at 18cm H2o  . Osteopenia     hip on DEXA in October 2007.  . Osteoporosis   . Oxygen deficiency   . Pacemaker    six sinus syndrome  . PAF (paroxysmal atrial fibrillation) (HCC)    a.  h/o brief episodes  noted on ppm interrogation. b. given GI bleeding and need to be on both ASA 81 and Brilinta, risks likely outweigh benefits of anticoagulation.   . Renal insufficiency   . Small bowel obstruction (HCC)    Recurrent; resolved after resection of a lipoma  . Syncope    a. H/o recurrent syncope with pauses on loop recorder s/p Medtronic pacemaker 07/2012.   Past Surgical History:  Procedure Laterality Date  . ABDOMINAL HYSTERECTOMY    . AORTIC VALVE REPLACEMENT  2011   Aortic valve replacement  surgery with a 19 mm bioprosthetic device post redo CABG January 2011.  Marland Kitchen BONE MARROW BIOPSY    . BREAST EXCISIONAL BIOPSY     benign, bilateral  . CARDIAC CATHETERIZATION    . CERVICAL BIOPSY    . COLONOSCOPY     Multiple with adenomatous polyps  . COLONOSCOPY  06/23/2012   Procedure: COLONOSCOPY;  Surgeon: Gatha Mayer, MD;  Location: [facility] ENDOSCOPY;  Service: Endoscopy;  Laterality: N/A;  . CORONARY ARTERY BYPASS GRAFT  2005, 2011   CABG-most recent SVG to RCA-12/05; RCA occluded with patent graft in 2006; Redo bypass in 07/2009  . ESOPHAGOGASTRODUODENOSCOPY    . ESOPHAGOGASTRODUODENOSCOPY  06/23/2012   Procedure: ESOPHAGOGASTRODUODENOSCOPY (EGD);  Surgeon: Gatha Mayer, MD;  Location: [facility];  Service: Endoscopy;  Laterality: N/A;  . ESOPHAGOGASTRODUODENOSCOPY N/A 04/06/2013   Procedure: ESOPHAGOGASTRODUODENOSCOPY (EGD);  Surgeon: Irene Shipper, MD;  Location: [facility] ENDOSCOPY;  Service: Endoscopy;  Laterality: N/A;  . EXPLORATORY LAPAROTOMY W/ BOWEL RESECTION     Small bowel resection of lipoma  . GIVENS CAPSULE STUDY N/A 04/30/2013   Procedure: GIVENS CAPSULE STUDY;  Surgeon: Gatha Mayer, MD;  Location: [facility];  Service: Endoscopy;  Laterality: N/A;  . LEFT AND RIGHT HEART CATHETERIZATION WITH CORONARY ANGIOGRAM N/A 04/07/2013   Procedure: LEFT AND RIGHT HEART CATHETERIZATION WITH CORONARY ANGIOGRAM;  Surgeon: Larey Dresser, MD;  Location: [facility] CATH LAB;  Service: Cardiovascular;  Laterality: N/A;  . LEFT ATRIAL APPENDAGE OCCLUSION N/A 02/16/2016   Watchman not placed - nickel allergy  . LEFT HEART CATHETERIZATION WITH CORONARY/GRAFT ANGIOGRAM N/A 06/07/2014   Procedure: LEFT HEART CATHETERIZATION WITH Beatrix Fetters;  Surgeon: Burnell Blanks, MD;  Location: [facility] CATH LAB;  Service: Cardiovascular;  Laterality: N/A;  . LIVER BIOPSY    . LOOP RECORDER IMPLANT N/A 12/10/2011   Procedure: LOOP RECORDER IMPLANT;  Surgeon: Evans Lance, MD;  Location: [facility] CATH LAB;   Service: Cardiovascular;  Laterality: N/A;  . MALONEY DILATION  06/23/2012   Procedure: [name] DILATION;  Surgeon: Gatha Mayer, MD;  Location: [facility] ENDOSCOPY;  Service: Endoscopy;;  54 fr  . MASTECTOMY Left 05/17/2015   total   . NEPHRECTOMY  2008   Laparoscopic right nephrectomy, renal cell carcinoma  . PACEMAKER INSERTION  08/21/2012   Medtronic Adapta L dual-chamber pacemaker  . PERCUTANEOUS CORONARY STENT INTERVENTION (PCI-S) N/A 04/09/2013   Procedure: PERCUTANEOUS CORONARY STENT INTERVENTION (PCI-S);  Surgeon: Burnell Blanks, MD;  Location: [facility] CATH LAB;  Service: Cardiovascular;  Laterality: N/A;  . PERMANENT PACEMAKER INSERTION N/A 08/21/2012   Procedure: PERMANENT PACEMAKER INSERTION;  Surgeon: Evans Lance, MD;  Location: [facility] CATH LAB;  Service: Cardiovascular;  Laterality: N/A;  . RIGHT HEART CATH N/A 03/18/2019   Procedure: RIGHT HEART CATH;  Surgeon: Larey Dresser, MD;  Location: [facility] CV LAB;  Service: Cardiovascular;  Laterality: N/A;  . RIGHT HEART CATH AND CORONARY/GRAFT ANGIOGRAPHY N/A 04/19/2017   Procedure: RIGHT  HEART CATH AND CORONARY/GRAFT ANGIOGRAPHY;  Surgeon: Larey Dresser, MD;  Location: [facility] CV LAB;  Service: Cardiovascular;  Laterality: N/A;  . TEE WITHOUT CARDIOVERSION N/A 12/07/2015   Procedure: TRANSESOPHAGEAL ECHOCARDIOGRAM (TEE);  Surgeon: Larey Dresser, MD;  Location: [facility];  Service: Cardiovascular;  Laterality: N/A;  . TOTAL ABDOMINAL HYSTERECTOMY W/ BILATERAL SALPINGOOPHORECTOMY  2000   endometriosis and ovarian cysts  . TOTAL MASTECTOMY Left 05/17/2015   Procedure: LEFT TOTAL MASTECTOMY;  Surgeon: Rolm Bookbinder, MD;  Location: [facility];  Service: General;  Laterality: Left;  . TUMOR EXCISION  1981   removal of Hodgkins lymphoma    SOCIAL HISTORY:  Social History   Socioeconomic History  . Marital status: Married    Spouse name: Not on file  . Number of children: 2  . Years of education: Not on file  . Highest  education level: Not on file  Occupational History  . Occupation: retired    Comment: elementary principal  Tobacco Use  . Smoking status: Never Smoker  . Smokeless tobacco: Never Used  Vaping Use  . Vaping Use: Never used  Substance and Sexual Activity  . Alcohol use: No    Alcohol/week: 0.0 standard drinks  . Drug use: No  . Sexual activity: Yes  Other Topics Concern  . Not on file  Social History Narrative   Retired [employer] principal, married    Started disability September 2013      Has one daughter in [place], works at [employer]. Has a son, Aaron Edelman, who is Retail buyer.   Eats all food groups.   Wear seatbelt.    Attends church. [place], [place]. [employer].    Social Determinants of Health   Financial Resource Strain:   . Difficulty of Paying Living Expenses: Not on file  Food Insecurity:   . Worried About Charity fundraiser in the Last Year: Not on file  . Ran Out of Food in the Last Year: Not on file  Transportation Needs:   . Lack of Transportation (Medical): Not on file  . Lack of Transportation (Non-Medical): Not on file  Physical Activity:   . Days of Exercise per Week: Not on file  . Minutes of Exercise per Session: Not on file  Stress:   . Feeling of Stress : Not on file  Social Connections:   . Frequency of Communication with Friends and Family: Not on file  . Frequency of Social Gatherings with Friends and Family: Not on file  . Attends Religious Services: Not on file  . Active Member of Clubs or Organizations: Not on file  . Attends Archivist Meetings: Not on file  . Marital Status: Not on file  Intimate Partner Violence:   . Fear of Current or Ex-Partner: Not on file  . Emotionally Abused: Not on file  . Physically Abused: Not on file  . Sexually Abused: Not on file    FAMILY HISTORY:  Family History  Problem Relation Age of Onset  . Heart attack Mother        d.61  . Heart disease Mother   . Heart attack Father        d.60    . Diabetes Father   . Heart disease Father   . Lung cancer Sister        d.61 metastases to brain. History of smoking. Maternal half-sister.  . Cancer Sister 30       unspecified gynecologic cancer (either cervical or ovarian) in her  66s.  . Heart attack Maternal Grandmother   . Cancer Maternal Grandfather 27       d.52s oral cancer  . Hypertension Brother   . Glaucoma Brother   . Breast cancer Sister 43       d.42 from metastatic disease. Paternal half-sister.  . Colon cancer Neg Hx   . Stomach cancer Neg Hx     CURRENT MEDICATIONS:  Current Outpatient Medications  Medication Sig Dispense Refill  . acyclovir ointment (ZOVIRAX) 5 % Apply 1 application topically every 3 (three) hours as needed (fever blister/cold sores).    . ALPRAZolam (XANAX) 0.5 MG tablet Take 1 tablet (0.5 mg total) by mouth 2 (two) times daily as needed for anxiety. 60 tablet 1  . APIDRA 100 UNIT/ML injection Inject 40 Units into the skin as directed. INFUSE THROUGH INSULIN PUMP UTD, Bolus depends on carb intake  2  . aspirin EC 81 MG tablet Take 81 mg by mouth at bedtime.    Marland Kitchen BRILINTA 60 MG TABS tablet TAKE ONE TABLET (60MG TOTAL) BY MOUTH TWICE DAILY 180 tablet 3  . clobetasol (OLUX) 0.05 % topical foam Apply 1 application topically 2 (two) times daily as needed (skin flaking).    . Coenzyme Q10 (CO Q 10) 100 MG CAPS Take 100 mg by mouth at bedtime.     . colchicine 0.6 MG tablet Take 0.6 mg by mouth daily as needed (gout flare).    . CONTOUR NEXT TEST test strip USE TO TEST BLOOD SUGAR UP TO EIGHT TIMES A DAY    . diclofenac Sodium (VOLTAREN) 1 % GEL as needed.    . docusate sodium (COLACE) 100 MG capsule Take 100 mg by mouth 2 (two) times daily.     . Evolocumab (REPATHA SURECLICK) 562 MG/ML SOAJ Inject 140 mg into the skin every 14 (fourteen) days. 6 pen 3  . famotidine (PEPCID) 40 MG tablet Take 40 mg by mouth daily.    Marland Kitchen glucagon (GLUCAGON EMERGENCY) 1 MG injection Inject 1 mg into the vein once as  needed (low blood sugar).     . isosorbide mononitrate (IMDUR) 120 MG 24 hr tablet TAKE ONE TABLET (120MG TOTAL) BY MOUTH DAILY 90 tablet 3  . latanoprost (XALATAN) 0.005 % ophthalmic solution Place 1 drop into both eyes at bedtime.    Marland Kitchen MAGNESIUM CITRATE PO Take 250 mg by mouth daily.    . metolazone (ZAROXOLYN) 2.5 MG tablet Take 2.5 mg (1 tab) once as directed by CHF clinic. Call before taking [phone] 3 tablet 0  . metoprolol tartrate (LOPRESSOR) 50 MG tablet Take 1 tablet (50 mg total) by mouth 2 (two) times daily. 60 tablet 3  . mometasone (NASONEX) 50 MCG/ACT nasal spray Place into the nose.    . mupirocin cream (BACTROBAN) 2 % Apply 1 application topically 2 (two) times daily as needed. 90 g 0  . nitroGLYCERIN (NITROLINGUAL) 0.4 MG/SPRAY spray PLACE 1 SPRAY UNDER THE TONGUE EVERY 5 MINUTES FOR 3 DOSES AS NEEDED FOR CHEST PAIN. 14.7 g 0  . pravastatin (PRAVACHOL) 10 MG tablet TAKE ONE TABLET (10 MG TOTAL) BY MOUTH DAILY. 90 tablet 3  . rOPINIRole (REQUIP) 0.25 MG tablet Take 1 tablet (0.25 mg total) by mouth daily. Take two hours prior to bedtime every night 90 tablet 3  . silver nitrate applicators 87-68 % applicator Use as needed to stop superficial skin bleeding. 10 each 0  . spironolactone (ALDACTONE) 25 MG tablet Take 12.5 mg by mouth daily.    Marland Kitchen  torsemide (DEMADEX) 20 MG tablet Take 2 tablets (40 mg total) by mouth daily. 180 tablet 3  . ULORIC 40 MG tablet Take 1 tablet (40 mg total) by mouth at bedtime. 90 tablet 3  . UNABLE TO FIND Med Name: So Clean CPAP cleaning system (Patient taking differently: Med Name: BiPap) 1 each 0  . VASCEPA 1 g capsule TAKE 2 CAPSULES BY MOUTH TWICE A DAY 360 capsule 3  . Wheat Dextrin (BENEFIBER) POWD Take 1 Dose by mouth daily.     Marland Kitchen erythromycin with ethanol (THERAMYCIN) 2 % external solution  (Patient not taking: Reported on 05/02/2020)     No current facility-administered medications for this visit.    ALLERGIES:  Allergies  Allergen  Reactions  . Avandia [Rosiglitazone Maleate] Other (See Comments)    Congestive heart failure  . Cephalexin Anaphylaxis, Swelling and Rash    Extremities swell , throat swelling   . Clindamycin Hcl Anaphylaxis and Shortness Of Breath  . Humalog [Insulin Lispro] Itching     big knots and whelp   . Lincomycin Anaphylaxis and Shortness Of Breath  . Metformin Other (See Comments)    Congestive heart failure  . Novolog [Insulin Aspart] Hives and Itching    Humalog & Novolog big knots and whelp    . Penicillins Anaphylaxis, Hives, Swelling and Rash    Did it involve swelling of the face/tongue/throat, SOB, or low BP? Yes Did it involve sudden or severe rash/hives, skin peeling, or any reaction on the inside of your mouth or nose? No Did you need to seek medical attention at a hospital or doctor's office? Yes When did it last happen?Childhood allergy  If all above answers are "NO", may proceed with cephalosporin use.   Marland Kitchen Rosiglitazone Other (See Comments)    Congestive heart failure  . Tizanidine Other (See Comments)    Dizziness, Mental Status Changes, Hallucination    . Adhesive [Tape] Other (See Comments)    Tears skin, Please use "paper" tape  . Atorvastatin Other (See Comments)    increased LFT's, no muscle weakness, leg pain Muscle and joint pain increased LFT's  Increased LFTs  . Cholestyramine Other (See Comments)    Liver Disorder Elevated LFTs    . Gentamycin [Gentamicin] Hives, Itching and Rash    Fine red bumps.  Reaction noted post loop recorder implant, where gentamycin was used for irrigation. Topical prep  . Iodinated Diagnostic Agents Other (See Comments)    PAIN DURING LYMPHANGIOGRAM '81, NO ALLERGY TO IV CONTRAST.Has had procedures with Iodine since without problems.   . Limonene Hives, Itching, Rash and Other (See Comments)    Reaction noted post loop recorder implant, where gentamycin was used for irrigation. Topical prep  . Pravastatin Other (See  Comments)    In high doses (20-45m) causes muscle and joint pain  Per pt. "can only take in low doses".   . Prednisone Itching and Other (See Comments)    B/P went high, itching all over.  Tolerates with benadryl   . Ranexa [Ranolazine] Other (See Comments)    Nausea, dizziness, low blood sugar, tingling in right hand, "blood blisters" on tongue   . Rosuvastatin Nausea And Vomiting, Nausea Only and Other (See Comments)    Muscle and joint pain & increased LFTs; tolerates Pravastatin 185m(max)  . Simvastatin Other (See Comments)    Increased LFT's   . Strawberry Extract Hives, Itching, Rash and Other (See Comments)    Reaction noted post loop recorder implant, where  gentamycin was used for irrigation. Topical prep.  Can eat fresh strawberries   . Clopidogrel Other (See Comments)    Does not work for patient Medicine was not effective  . Codeine Nausea And Vomiting  . Erythromycin Nausea And Vomiting  . Hydrocodone-Acetaminophen Itching, Rash and Other (See Comments)    itching  . Latex Itching and Other (See Comments)    I"if wear gloves hands get red ,itchy no prioblem if other wear and touch me"  . Neomycin Rash and Other (See Comments)    Redness  . Nickel Itching  . Ranitidine Nausea Only  . Tamiflu [Oseltamivir] Nausea And Vomiting  . Tramadol Nausea Only    PHYSICAL EXAM:  Performance status (ECOG): 1 - Symptomatic but completely ambulatory  Vitals:   05/02/20 1520  BP: 138/72  Pulse: 71  Resp: 16  Temp: (!) 96.8 F (36 C)  SpO2: 98%   Wt Readings from Last 3 Encounters:  05/02/20 115 lb 4.8 oz (52.3 kg)  04/20/20 116 lb (52.6 kg)  03/02/20 116 lb 9.6 oz (52.9 kg)   Physical Exam Vitals reviewed.  Constitutional:      Appearance: Normal appearance.  Cardiovascular:     Rate and Rhythm: Normal rate and regular rhythm.     Pulses: Normal pulses.     Heart sounds: Normal heart sounds.  Pulmonary:     Effort: Pulmonary effort is normal.     Breath sounds:  Examination of the right-lower field reveals decreased breath sounds. Decreased breath sounds present.  Neurological:     General: No focal deficit present.     Mental Status: She is alert and oriented to person, place, and time.  Psychiatric:        Mood and Affect: Mood normal.        Behavior: Behavior normal.     LABORATORY DATA:  I have reviewed the labs as listed.  CBC Latest Ref Rng & Units 04/25/2020 02/24/2020 11/16/2019  WBC 4.0 - 10.5 K/uL 8.4 9.2 9.9  Hemoglobin 12.0 - 15.0 g/dL 12.1 10.6(L) 12.9  Hematocrit 36 - 46 % 38.0 33.7(L) 40.1  Platelets 150 - 400 K/uL 124(L) 116(L) 143(L)   CMP Latest Ref Rng & Units 04/25/2020 04/20/2020 02/24/2020  Glucose 70 - 99 mg/dL 210(H) 273(H) 279(H)  BUN 8 - 23 mg/dL 62(H) 57(H) 56(H)  Creatinine 0.44 - 1.00 mg/dL 1.77(H) 1.68(H) 1.69(H)  Sodium 135 - 145 mmol/L 136 137 139  Potassium 3.5 - 5.1 mmol/L 4.8 4.3 3.8  Chloride 98 - 111 mmol/L 100 99 102  CO2 22 - 32 mmol/L 26 27 25   Calcium 8.9 - 10.3 mg/dL 10.0 10.1 9.9  Total Protein 6.5 - 8.1 g/dL 8.0 - -  Total Bilirubin 0.3 - 1.2 mg/dL 0.8 - -  Alkaline Phos 38 - 126 U/L 113 - -  AST 15 - 41 U/L 28 - -  ALT 0 - 44 U/L 19 - -      Component Value Date/Time   RBC 4.18 04/25/2020 1015   MCV 90.9 04/25/2020 1015   MCV 92 09/16/2019 1533   MCV 88.8 12/13/2016 1427   MCH 28.9 04/25/2020 1015   MCHC 31.8 04/25/2020 1015   RDW 13.3 04/25/2020 1015   RDW 13.2 09/16/2019 1533   RDW 14.6 (H) 12/13/2016 1427   LYMPHSABS 1.5 04/25/2020 1015   LYMPHSABS 1.2 12/13/2016 1427   MONOABS 0.5 04/25/2020 1015   MONOABS 0.8 12/13/2016 1427   EOSABS 0.1 04/25/2020 1015   EOSABS  0.2 12/13/2016 1427   BASOSABS 0.1 04/25/2020 1015   BASOSABS 0.1 12/13/2016 1427   Lab Results  Component Value Date   TOTALPROTELP 7.9 04/25/2020   TOTALPROTELP 7.8 04/25/2020   ALBUMINELP 4.1 04/25/2020   A1GS 0.2 04/25/2020   A2GS 0.8 04/25/2020   BETS 1.2 04/25/2020   GAMS 1.6 04/25/2020   MSPIKE Not  Observed 04/25/2020   SPEI Comment 04/25/2020    Lab Results  Component Value Date   KPAFRELGTCHN 84.1 (H) 04/25/2020   LAMBDASER 37.3 (H) 04/25/2020   KAPLAMBRATIO 2.25 (H) 04/25/2020   Lab Results  Component Value Date   LDH 137 04/25/2020   LDH 156 11/16/2019   LDH 135 08/10/2019    DIAGNOSTIC IMAGING:  I have independently reviewed the scans and discussed with the patient. ECHOCARDIOGRAM COMPLETE  Result Date: 04/20/2020    ECHOCARDIOGRAM REPORT   Patient Name:   [name] [name] Date of Exam: 04/20/2020 Medical Rec #:  [id number]         Height:       63.0 in Accession #:    [id number]        Weight:       116.6 lb Date of Birth:  [birth year]         BSA:          1.537 m Patient Age:    80 years          BP:           110/60 mmHg Patient Gender: F                 HR:           72 bpm. Exam Location:  Outpatient Procedure: 2D Echo, Cardiac Doppler and Color Doppler Indications:    CHF  History:        Patient has prior history of Echocardiogram examinations, most                 recent 03/10/2019. CHF, CAD, Prior CABG and Pacemaker; Aortic                 Valve Disease and AVR. Left mastectomy.  Sonographer:    Dustin Flock Referring Phys: [place]  1. Left ventricular ejection fraction, by estimation, is 60 to 65%. The left ventricle has normal function. The left ventricle has no regional wall motion abnormalities. There is mild left ventricular hypertrophy. Left ventricular diastolic parameters are indeterminate.  2. Right ventricular systolic function is mildly reduced. The right ventricular size is mildly enlarged. There is mildly elevated pulmonary artery systolic pressure. The estimated right ventricular systolic pressure is 86.5 mmHg. D-shaped IV septum suggests RV pressure/volume overload.  3. Left atrial size was mildly dilated.  4. Pleural effusion noted.  5. The mitral valve is rheumatic. Mild to moderate mitral valve regurgitation. Mild mitral stenosis  => mean gradient 9 mmHg but calculated MVA by PHT was 2.7 cm^2. Moderate mitral annular calcification.  6. Bioprosthetic aortic valve. Mean gradient 8 mmHg. No significant stenosis or regurgitation noted.  7. The inferior vena cava is normal in size with <50% respiratory variability, suggesting right atrial pressure of 8 mmHg. FINDINGS  Left Ventricle: Left ventricular ejection fraction, by estimation, is 60 to 65%. The left ventricle has normal function. The left ventricle has no regional wall motion abnormalities. The left ventricular internal cavity size was normal in size. There is  mild left ventricular hypertrophy. Left ventricular diastolic parameters are indeterminate.  Right Ventricle: The right ventricular size is mildly enlarged. No increase in right ventricular wall thickness. Right ventricular systolic function is mildly reduced. There is mildly elevated pulmonary artery systolic pressure. The tricuspid regurgitant  velocity is 2.81 m/s, and with an assumed right atrial pressure of 8 mmHg, the estimated right ventricular systolic pressure is 56.3 mmHg. Left Atrium: Left atrial size was mildly dilated. Right Atrium: Right atrial size was normal in size. Pericardium: Pleural effusion noted. There is no evidence of pericardial effusion. Mitral Valve: The mitral valve is rheumatic. There is moderate thickening of the mitral valve leaflet(s). There is moderate calcification of the mitral valve leaflet(s). Moderate mitral annular calcification. Mild to moderate mitral valve regurgitation. Mild mitral valve stenosis. MV peak gradient, 22.7 mmHg. The mean mitral valve gradient is 9.0 mmHg. Tricuspid Valve: The tricuspid valve is normal in structure. Tricuspid valve regurgitation is trivial. Aortic Valve: Bioprosthetic aortic valve. Mean gradient 8 mmHg. No significant stenosis or regurgitation noted. The aortic valve has been repaired/replaced. Aortic valve regurgitation is not visualized. Aortic valve mean  gradient measures 8.0 mmHg. Aortic valve peak gradient measures 15.0 mmHg. Aortic valve area, by VTI measures 1.32 cm. Pulmonic Valve: The pulmonic valve was normal in structure. Pulmonic valve regurgitation is trivial. Aorta: The aortic root is normal in size and structure. Venous: The inferior vena cava is normal in size with less than 50% respiratory variability, suggesting right atrial pressure of 8 mmHg. IAS/Shunts: No atrial level shunt detected by color flow Doppler. Additional Comments: A pacer wire is visualized in the right ventricle.  LEFT VENTRICLE PLAX 2D LVIDd:         2.70 cm LVIDs:         2.30 cm LV PW:         1.20 cm LV IVS:        1.20 cm LVOT diam:     1.80 cm LV SV:         43 LV SV Index:   28 LVOT Area:     2.54 cm  RIGHT VENTRICLE RV Basal diam:  2.50 cm RV S prime:     5.00 cm/s TAPSE (M-mode): 2.3 cm LEFT ATRIUM             Index       RIGHT ATRIUM           Index LA diam:        3.70 cm 2.41 cm/m  RA Area:     10.00 cm LA Vol (A2C):   62.5 ml 40.65 ml/m RA Volume:   21.70 ml  14.11 ml/m LA Vol (A4C):   28.7 ml 18.67 ml/m LA Biplane Vol: 45.4 ml 29.53 ml/m  AORTIC VALVE AV Area (Vmax):    0.99 cm AV Area (Vmean):   0.96 cm AV Area (VTI):     1.32 cm AV Vmax:           193.67 cm/s AV Vmean:          131.000 cm/s AV VTI:            0.327 m AV Peak Grad:      15.0 mmHg AV Mean Grad:      8.0 mmHg LVOT Vmax:         75.50 cm/s LVOT Vmean:        49.600 cm/s LVOT VTI:          0.169 m LVOT/AV VTI ratio: 0.52  AORTA Ao Root diam: 2.10 cm  MITRAL VALVE                TRICUSPID VALVE MV Area (PHT): 4.49 cm     TR Peak grad:   31.6 mmHg MV Peak grad:  22.7 mmHg    TR Vmax:        281.00 cm/s MV Mean grad:  9.0 mmHg MV Vmax:       2.38 m/s     SHUNTS MV Vmean:      115.0 cm/s   Systemic VTI:  0.17 m MV Decel Time: 169 msec     Systemic Diam: 1.80 cm MV E velocity: 191.00 cm/s [name] MD Electronically signed by [name] MD Signature Date/Time: 04/20/2020/9:32:51 AM    Final        ASSESSMENT:  1.  Mild thrombocytopenia: -CT of the abdomen did not show any splenomegaly.  Previous CT disorder work-up was negative. -Peripheral blood smear showed giant platelets.  Clinically consistent with immune mediated thrombocytopenia.  2.  Mild hypercalcemia: -Her calcium level for the last 3 times in our clinic was normal to upper limit of normal. -Previous vitamin D was 75.  Intact PTH was normal.  Nonparathyroid hypercalcemia.  Calcitriol was 16.5.  PTH RP was less than 2. -SPEP and immunofixation was negative.  Free light chain ratio was elevated.  3.  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 -Last mammogram on 08/04/2019 reviewed by me was BI-RADS Category 4 with 0.6 cm mixed echogenicity mass in the right breast at 9:00. -Aspiration of this 0.6 cm complex cyst was successful on 08/24/2019.  Aspiration of the 0.4 cm cyst in the right breast 9:00 was successful.  Routine annual mammogram was recommended.  4.  Stage IIb Hodgkin's disease: -She received mantle radiation in 1981.  Continues to be in remission since then.  Does not have any B symptoms.  5.  Chromophobe renal cell carcinoma: -Nephrectomy in 2008.  Last CT in September 2019 showed stable 14 mm left renal lesion.   PLAN:  1.  Mild thrombocytopenia: -She denies any active bleeding or easy bruising. -CBC from 04/25/2020 shows platelet count 124 with normal hemoglobin and white count. -No active intervention necessary. -She reportedly had recent CT scan of the chest which showed right pleural effusion.  However they are unable to do thoracentesis 2 weeks later as the fluid reportedly disappeared.  This was done at [facility].  As she had right upper lobe lung nodule, they are planning to follow-up with a PET CT scan soon. -RTC 6 months with repeat labs.  2.  Mild hypercalcemia: -Labs from 04/25/2020 shows normal calcium of 10 with albumin 4.3. -SPEP and immunofixation was  negative.  Free kappa light chain is 84.1, lambda light chain 37 and ratio of 2.25. -No clear evidence of plasma cell disorder.  Elevated free light chain ratio secondary to CKD.  3.  Health maintenance: -We will arrange for mammogram of the right breast in January 2022.    Orders placed this encounter:  Orders Placed This Encounter  Procedures  . CBC with Differential/Platelet  . Comprehensive metabolic panel  . Kappa/lambda light chains     Derek Jack, MD [place] [phone]   I, Milinda Antis, am acting as a scribe for Dr. Sanda Linger.  I, Derek Jack MD, have reviewed the above documentation for accuracy and completeness, and I agree with the above.

## 2020-05-02 NOTE — Patient Instructions (Signed)
[place] [facility] [place] Discharge Instructions  You were seen today by Dr. Delton Coombes. He went over your recent results. Keep your appointment for your mammogram after August 03, 2020. Dr. Delton Coombes will see you back in 6 months for labs and follow up.   Thank you for choosing [place] at [facility] to provide your oncology and hematology care.  To afford each patient quality time with our provider, please arrive at least 15 minutes before your scheduled appointment time.   If you have a lab appointment with the [facility] please come in thru the Main Entrance and check in at the main information desk  You need to re-schedule your appointment should you arrive 10 or more minutes late.  We strive to give you quality time with our providers, and arriving late affects you and other patients whose appointments are after yours.  Also, if you no show three or more times for appointments you may be dismissed from the clinic at the providers discretion.     Again, thank you for choosing [facility].  Our hope is that these requests will decrease the amount of time that you wait before being seen by our physicians.       _____________________________________________________________  Should you have questions after your visit to [facility], please contact our office at [phone] between the hours of 8:00 a.m. and 4:30 p.m.  Voicemails left after 4:00 p.m. will not be returned until the following business day.  For prescription refill requests, have your pharmacy contact our office and allow 72 hours.    Cancer Center Support Programs:   > Cancer Support Group  2nd Tuesday of the month 1pm-2pm, Journey Room

## 2020-05-03 ENCOUNTER — Encounter (HOSPITAL_COMMUNITY): Payer: Self-pay

## 2020-05-04 DIAGNOSIS — T50905A Adverse effect of unspecified drugs, medicaments and biological substances, initial encounter: Secondary | ICD-10-CM | POA: Diagnosis not present

## 2020-05-04 DIAGNOSIS — R238 Other skin changes: Secondary | ICD-10-CM | POA: Diagnosis not present

## 2020-05-16 DIAGNOSIS — E1021 Type 1 diabetes mellitus with diabetic nephropathy: Secondary | ICD-10-CM | POA: Diagnosis not present

## 2020-05-17 ENCOUNTER — Other Ambulatory Visit (HOSPITAL_COMMUNITY): Payer: Self-pay | Admitting: Cardiology

## 2020-05-17 DIAGNOSIS — G4733 Obstructive sleep apnea (adult) (pediatric): Secondary | ICD-10-CM | POA: Diagnosis not present

## 2020-05-18 DIAGNOSIS — R490 Dysphonia: Secondary | ICD-10-CM | POA: Diagnosis not present

## 2020-05-18 DIAGNOSIS — J383 Other diseases of vocal cords: Secondary | ICD-10-CM | POA: Diagnosis not present

## 2020-05-19 DIAGNOSIS — M810 Age-related osteoporosis without current pathological fracture: Secondary | ICD-10-CM | POA: Diagnosis not present

## 2020-05-19 DIAGNOSIS — E1122 Type 2 diabetes mellitus with diabetic chronic kidney disease: Secondary | ICD-10-CM | POA: Diagnosis not present

## 2020-05-19 DIAGNOSIS — I509 Heart failure, unspecified: Secondary | ICD-10-CM | POA: Diagnosis not present

## 2020-05-19 DIAGNOSIS — Q600 Renal agenesis, unilateral: Secondary | ICD-10-CM | POA: Diagnosis not present

## 2020-05-19 DIAGNOSIS — K7581 Nonalcoholic steatohepatitis (NASH): Secondary | ICD-10-CM | POA: Diagnosis not present

## 2020-05-19 DIAGNOSIS — D649 Anemia, unspecified: Secondary | ICD-10-CM | POA: Diagnosis not present

## 2020-05-19 DIAGNOSIS — I2510 Atherosclerotic heart disease of native coronary artery without angina pectoris: Secondary | ICD-10-CM | POA: Diagnosis not present

## 2020-05-19 DIAGNOSIS — N184 Chronic kidney disease, stage 4 (severe): Secondary | ICD-10-CM | POA: Diagnosis not present

## 2020-05-24 DIAGNOSIS — H10413 Chronic giant papillary conjunctivitis, bilateral: Secondary | ICD-10-CM | POA: Diagnosis not present

## 2020-05-30 DIAGNOSIS — G4731 Primary central sleep apnea: Secondary | ICD-10-CM | POA: Diagnosis not present

## 2020-06-21 DIAGNOSIS — E1021 Type 1 diabetes mellitus with diabetic nephropathy: Secondary | ICD-10-CM | POA: Diagnosis not present

## 2020-06-29 DIAGNOSIS — G4731 Primary central sleep apnea: Secondary | ICD-10-CM | POA: Diagnosis not present

## 2020-07-04 DIAGNOSIS — Z923 Personal history of irradiation: Secondary | ICD-10-CM | POA: Diagnosis not present

## 2020-07-04 DIAGNOSIS — I739 Peripheral vascular disease, unspecified: Secondary | ICD-10-CM | POA: Diagnosis not present

## 2020-07-04 DIAGNOSIS — N184 Chronic kidney disease, stage 4 (severe): Secondary | ICD-10-CM | POA: Diagnosis not present

## 2020-07-04 DIAGNOSIS — M810 Age-related osteoporosis without current pathological fracture: Secondary | ICD-10-CM | POA: Diagnosis not present

## 2020-07-04 DIAGNOSIS — I2510 Atherosclerotic heart disease of native coronary artery without angina pectoris: Secondary | ICD-10-CM | POA: Diagnosis not present

## 2020-07-04 DIAGNOSIS — Z888 Allergy status to other drugs, medicaments and biological substances status: Secondary | ICD-10-CM | POA: Diagnosis not present

## 2020-07-04 DIAGNOSIS — Z9641 Presence of insulin pump (external) (internal): Secondary | ICD-10-CM | POA: Diagnosis not present

## 2020-07-04 DIAGNOSIS — K7400 Hepatic fibrosis, unspecified: Secondary | ICD-10-CM | POA: Diagnosis not present

## 2020-07-04 DIAGNOSIS — E1021 Type 1 diabetes mellitus with diabetic nephropathy: Secondary | ICD-10-CM | POA: Diagnosis not present

## 2020-07-05 ENCOUNTER — Telehealth: Payer: Self-pay | Admitting: Internal Medicine

## 2020-07-05 ENCOUNTER — Encounter: Payer: Self-pay | Admitting: Cardiology

## 2020-07-05 ENCOUNTER — Telehealth (INDEPENDENT_AMBULATORY_CARE_PROVIDER_SITE_OTHER): Payer: Medicare PPO | Admitting: Cardiology

## 2020-07-05 ENCOUNTER — Other Ambulatory Visit: Payer: Self-pay

## 2020-07-05 ENCOUNTER — Ambulatory Visit (INDEPENDENT_AMBULATORY_CARE_PROVIDER_SITE_OTHER): Payer: Medicare PPO

## 2020-07-05 VITALS — BP 126/67 | HR 69 | Ht 63.0 in | Wt 113.0 lb

## 2020-07-05 DIAGNOSIS — I10 Essential (primary) hypertension: Secondary | ICD-10-CM | POA: Diagnosis not present

## 2020-07-05 DIAGNOSIS — I495 Sick sinus syndrome: Secondary | ICD-10-CM

## 2020-07-05 DIAGNOSIS — G2581 Restless legs syndrome: Secondary | ICD-10-CM

## 2020-07-05 DIAGNOSIS — Z9989 Dependence on other enabling machines and devices: Secondary | ICD-10-CM | POA: Diagnosis not present

## 2020-07-05 DIAGNOSIS — G4733 Obstructive sleep apnea (adult) (pediatric): Secondary | ICD-10-CM | POA: Diagnosis not present

## 2020-07-05 LAB — CUP PACEART REMOTE DEVICE CHECK
Battery Impedance: 506 Ohm
Battery Remaining Longevity: 100 mo
Battery Voltage: 2.8 V
Brady Statistic AP VP Percent: 0 %
Brady Statistic AP VS Percent: 4 %
Brady Statistic AS VP Percent: 0 %
Brady Statistic AS VS Percent: 96 %
Date Time Interrogation Session: 20211214124920
Implantable Lead Implant Date: 20140130
Implantable Lead Implant Date: 20140130
Implantable Lead Location: 753859
Implantable Lead Location: 753860
Implantable Lead Model: 5076
Implantable Lead Model: 5076
Implantable Pulse Generator Implant Date: 20140130
Lead Channel Impedance Value: 503 Ohm
Lead Channel Impedance Value: 613 Ohm
Lead Channel Pacing Threshold Amplitude: 0.375 V
Lead Channel Pacing Threshold Amplitude: 0.875 V
Lead Channel Pacing Threshold Pulse Width: 0.4 ms
Lead Channel Pacing Threshold Pulse Width: 0.4 ms
Lead Channel Setting Pacing Amplitude: 1.75 V
Lead Channel Setting Pacing Amplitude: 2.5 V
Lead Channel Setting Pacing Pulse Width: 0.4 ms
Lead Channel Setting Sensing Sensitivity: 5.6 mV

## 2020-07-05 NOTE — Progress Notes (Signed)
Virtual Visit via Video Note   This visit type was conducted due to national recommendations for restrictions regarding the COVID-19 Pandemic (e.g. social distancing) in an effort to limit this patient's exposure and mitigate transmission in our community.  Due to her co-morbid illnesses, this patient is at least at moderate risk for complications without adequate follow up.  This format is felt to be most appropriate for this patient at this time.  The patient did not have access to video technology/had technical difficulties with video requiring transitioning to audio format only (telephone).  All issues noted in this document were discussed and addressed.  No physical exam could be performed with this format.  Please refer to the patient's chart for her  consent to telehealth for [facility].   Evaluation Performed:  Follow-up visit  This visit type was conducted due to national recommendations for restrictions regarding the COVID-19 Pandemic (e.g. social distancing).  This format is felt to be most appropriate for this patient at this time.  All issues noted in this document were discussed and addressed.  No physical exam was performed (except for noted visual exam findings with Video Visits).  Please refer to the patient's chart (MyChart message for video visits and phone note for telephone visits) for the patient's consent to telehealth for [facility].  Date:  07/05/2020   ID:  [name] [name], [name] [name] 11/13/1950, MRN [id number]  Patient Location:  Home  Provider location:   [place]  PCP:  [name], MD  Cardiologist: [name], MD Sleep Medicine:  Fransico Him, MD Electrophysiologist:  [name], MD   Chief Complaint:  OSA  History of Present Illness:    [name] [name] is a 69 y.o. female who presents via audio/video conferencing for a telehealth visit today.    [name] [name] a 68y.o.femalewith a hx of chronic diastolic CHF, CAD and afib who was  referred for a sleep study by Dr. Aundra Dubin due to excessive daytime sleepiness. She was found to have moderate OSA with an AHI of 22/hr with significant oxygen desaturations as low as 85%. She underwent CPAP titration to 18cm H2Obut did not tolerate the high pressure and was subsequently placed on auto Pap from 6 to 14 cm H2O and is now on BiPAP.  She has had problems with  restless legs and I started her on Requip 0.22m nightly.  Ferritin was normal.  Her AHI was also too high as well.  She was having problems with sleeping due to the restless legs disrupting her sleep and then getting insomnia and napping during the day.   She is doing well with her BiPAP device and thinks that she has gotten used to it.  She tolerates the mask and feels the pressure is adequate.  Since going on BiPAP she feels rested in the am and has no significant daytime sleepiness.  She has a lot of problems with dry mouth and her lips and tongue are stuck together in the am.   She does not think that he snores.    The patient does not have symptoms concerning for COVID-19 infection (fever, chills, cough, or new shortness of breath).   Prior CV studies:   The following studies were reviewed today:  PAP compliance download  Past Medical History:  Diagnosis Date  . Anxiety   . Aortic stenosis    a. bicuspid aortic valve; mean gradient of 20 mmHg in 2/10; b. s/p bioprosth AVR (19-mm Edwards pericardial valve) with a redo  coronary artery bypass graft procedure in 07/2009; postoperative Dressler's syndrome    . Arthritis   . Breast cancer ([place])   . Carcinoma, renal cell ([place]) 05/2007   Laparoscopic right nephrectomy  . 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 Chronic diastolic CHF (congestive heart failure) ([place])    a. 9.2014 EchoP EF 60-65%, no rwma, bioprosth AVR, mean gradient of 32mHg, mild to mod MR, PASP 594mg.  . CKD (chronic kidney disease), stage II   . Colitis, ischemic ([facility]2008  . Coronary artery  disease    a. initial CABG with SVG-RCA in 12/05. b. redo SVG-RCA and bioprosthetic AVR in 1/11. d. 9/14 - PCI with DES to distal LM/proximal LAD was done rather than bypass as she would have been a poor candidate for redo sternotomy. d. S/p DES to prox LM 05/2014.  . Diabetes mellitus 03/2010    TYPE II: Hemoglobin A1c of 7.4 in  03/2010; 8.4 in 06/2010; treated with insulin pump  . Duodenal ulcer    Remote; H. pylori positive  . Gastroparesis due to DM ([facility]10/04/2012  . Genetic testing 01/14/2017   Ms. Schomer underwent genetic counseling and testing for hereditary cancer syndromes on 01/01/2017. Her results were negative for mutations in all 83 genes analyzed by [facility]'s 83-gene Common Hereditary Cancers Panel. Genes analyzed include: ALK, APC, ATM, AXIN2, BAP1, BARD1, BLM, BMPR1A, BRCA1, BRCA2, BRIP1, CASR, CDC73, CDH1, CDK4, CDKN1B, CDKN1C, CDKN2A, CEBPA, CHEK2, CTNNA1, DICER1, DIS3L2,   . Hodgkin's disease ([facility]   Mantle radiation therapy  . Hodgkin's disease ([facility]CD[place]  . Hyperlipidemia   . Hyperplastic gastric polyp 06/23/2012  . Hypertension   . Iron deficiency anemia    a. 03/2013 EGD: essentially normal. Possible slow GIB.  . Migraines   . NASH (nonalcoholic steatohepatitis) 1999   -biopsy in 1999  . OSA on CPAP 04/24/2017   Moderate with AHI 22/hr now on CPAP at 18cm H2o  . Osteopenia     hip on DEXA in October 2007.  . Osteoporosis   . Oxygen deficiency   . Pacemaker    six sinus syndrome  . PAF (paroxysmal atrial fibrillation) ([facility]   a.  h/o brief episodes noted on ppm interrogation. b. given GI bleeding and need to be on both ASA 81 and Brilinta, risks likely outweigh benefits of anticoagulation.   . Renal insufficiency   . Small bowel obstruction (H[facility])    Recurrent; resolved after resection of a lipoma  . Syncope    a. H/o recurrent syncope with pauses on loop recorder s/p Medtronic pacemaker 07/2012.   Past Surgical History:  Procedure Laterality Date  .  ABDOMINAL HYSTERECTOMY    . AORTIC VALVE REPLACEMENT  2011   Aortic valve replacement surgery with a 19 mm bioprosthetic device post redo CABG January 2011.  . Marland KitchenONE MARROW BIOPSY    . BREAST EXCISIONAL BIOPSY     benign, bilateral  . CARDIAC CATHETERIZATION    . CERVICAL BIOPSY    . COLONOSCOPY     Multiple with adenomatous polyps  . COLONOSCOPY  06/23/2012   Procedure: COLONOSCOPY;  Surgeon: CaGatha MayerMD;  Location: [facility] ENDOSCOPY;  Service: Endoscopy;  Laterality: N/A;  . CORONARY ARTERY BYPASS GRAFT  2005, 2011   CABG-most recent SVG to RCA-12/05; RCA occluded with patent graft in 2006; Redo bypass in 07/2009  . ESOPHAGOGASTRODUODENOSCOPY    . ESOPHAGOGASTRODUODENOSCOPY  06/23/2012   Procedure: ESOPHAGOGASTRODUODENOSCOPY (EGD);  Surgeon: CaGatha MayerMD;  Location:  [facility] ENDOSCOPY;  Service: Endoscopy;  Laterality: N/A;  . ESOPHAGOGASTRODUODENOSCOPY N/A 04/06/2013   Procedure: ESOPHAGOGASTRODUODENOSCOPY (EGD);  Surgeon: Irene Shipper, MD;  Location: [facility] ENDOSCOPY;  Service: Endoscopy;  Laterality: N/A;  . EXPLORATORY LAPAROTOMY W/ BOWEL RESECTION     Small bowel resection of lipoma  . GIVENS CAPSULE STUDY N/A 04/30/2013   Procedure: GIVENS CAPSULE STUDY;  Surgeon: Gatha Mayer, MD;  Location: [place];  Service: Endoscopy;  Laterality: N/A;  . LEFT AND RIGHT HEART CATHETERIZATION WITH CORONARY ANGIOGRAM N/A 04/07/2013   Procedure: LEFT AND RIGHT HEART CATHETERIZATION WITH CORONARY ANGIOGRAM;  Surgeon: Larey Dresser, MD;  Location: [facility] CATH LAB;  Service: Cardiovascular;  Laterality: N/A;  . LEFT ATRIAL APPENDAGE OCCLUSION N/A 02/16/2016   Watchman not placed - nickel allergy  . LEFT HEART CATHETERIZATION WITH CORONARY/GRAFT ANGIOGRAM N/A 06/07/2014   Procedure: LEFT HEART CATHETERIZATION WITH Beatrix Fetters;  Surgeon: Burnell Blanks, MD;  Location: [facility] CATH LAB;  Service: Cardiovascular;  Laterality: N/A;  . LIVER BIOPSY    . LOOP RECORDER IMPLANT N/A 12/10/2011    Procedure: LOOP RECORDER IMPLANT;  Surgeon: Evans Lance, MD;  Location: [facility] CATH LAB;  Service: Cardiovascular;  Laterality: N/A;  . MALONEY DILATION  06/23/2012   Procedure: [name] DILATION;  Surgeon: Gatha Mayer, MD;  Location: [facility] ENDOSCOPY;  Service: Endoscopy;;  54 fr  . MASTECTOMY Left 05/17/2015   total   . NEPHRECTOMY  2008   Laparoscopic right nephrectomy, renal cell carcinoma  . PACEMAKER INSERTION  08/21/2012   Medtronic Adapta L dual-chamber pacemaker  . PERCUTANEOUS CORONARY STENT INTERVENTION (PCI-S) N/A 04/09/2013   Procedure: PERCUTANEOUS CORONARY STENT INTERVENTION (PCI-S);  Surgeon: Burnell Blanks, MD;  Location: [facility] CATH LAB;  Service: Cardiovascular;  Laterality: N/A;  . PERMANENT PACEMAKER INSERTION N/A 08/21/2012   Procedure: PERMANENT PACEMAKER INSERTION;  Surgeon: Evans Lance, MD;  Location: [facility] CATH LAB;  Service: Cardiovascular;  Laterality: N/A;  . RIGHT HEART CATH N/A 03/18/2019   Procedure: RIGHT HEART CATH;  Surgeon: Larey Dresser, MD;  Location: [place] CV LAB;  Service: Cardiovascular;  Laterality: N/A;  . RIGHT HEART CATH AND CORONARY/GRAFT ANGIOGRAPHY N/A 04/19/2017   Procedure: RIGHT HEART CATH AND CORONARY/GRAFT ANGIOGRAPHY;  Surgeon: Larey Dresser, MD;  Location: [place] CV LAB;  Service: Cardiovascular;  Laterality: N/A;  . TEE WITHOUT CARDIOVERSION N/A 12/07/2015   Procedure: TRANSESOPHAGEAL ECHOCARDIOGRAM (TEE);  Surgeon: Larey Dresser, MD;  Location: [place];  Service: Cardiovascular;  Laterality: N/A;  . TOTAL ABDOMINAL HYSTERECTOMY W/ BILATERAL SALPINGOOPHORECTOMY  2000   endometriosis and ovarian cysts  . TOTAL MASTECTOMY Left 05/17/2015   Procedure: LEFT TOTAL MASTECTOMY;  Surgeon: Rolm Bookbinder, MD;  Location: [place];  Service: General;  Laterality: Left;  . TUMOR EXCISION  1981   removal of Hodgkins lymphoma     Current Meds  Medication Sig  . acyclovir ointment (ZOVIRAX) 5 % Apply 1 application topically every  3 (three) hours as needed (fever blister/cold sores).  . ALPRAZolam (XANAX) 0.5 MG tablet Take 1 tablet (0.5 mg total) by mouth 2 (two) times daily as needed for anxiety.  . APIDRA 100 UNIT/ML injection Inject 40 Units into the skin as directed. INFUSE THROUGH INSULIN PUMP UTD, Bolus depends on carb intake  . aspirin EC 81 MG tablet Take 81 mg by mouth at bedtime.  Marland Kitchen BRILINTA 60 MG TABS tablet TAKE ONE TABLET (60MG TOTAL) BY MOUTH TWICE DAILY  . clobetasol (OLUX) 0.05 % topical  foam Apply 1 application topically 2 (two) times daily as needed (skin flaking).  . Coenzyme Q10 (CO Q 10) 100 MG CAPS Take 100 mg by mouth at bedtime.   . colchicine 0.6 MG tablet Take 0.6 mg by mouth daily as needed (gout flare).  . CONTOUR NEXT TEST test strip USE TO TEST BLOOD SUGAR UP TO EIGHT TIMES A DAY  . diclofenac Sodium (VOLTAREN) 1 % GEL as needed.  . docusate sodium (COLACE) 100 MG capsule Take 100 mg by mouth 2 (two) times daily.  Marland Kitchen erythromycin with ethanol (THERAMYCIN) 2 % external solution   . Evolocumab (REPATHA SURECLICK) 254 MG/ML SOAJ Inject 140 mg into the skin every 14 (fourteen) days.  . famotidine (PEPCID) 40 MG tablet Take 40 mg by mouth daily.  Marland Kitchen glucagon (GLUCAGON EMERGENCY) 1 MG injection Inject 1 mg into the vein once as needed (low blood sugar).   . isosorbide mononitrate (IMDUR) 120 MG 24 hr tablet TAKE ONE TABLET (120MG TOTAL) BY MOUTH DAILY  . latanoprost (XALATAN) 0.005 % ophthalmic solution Place 1 drop into both eyes at bedtime.  . metolazone (ZAROXOLYN) 2.5 MG tablet Take 2.5 mg (1 tab) once as directed by CHF clinic. Call before taking [phone]  . metoprolol tartrate (LOPRESSOR) 50 MG tablet Take 1 tablet (50 mg total) by mouth 2 (two) times daily.  . mometasone (NASONEX) 50 MCG/ACT nasal spray Place into the nose.  . mupirocin cream (BACTROBAN) 2 % Apply 1 application topically 2 (two) times daily as needed.  . nitroGLYCERIN (NITROLINGUAL) 0.4 MG/SPRAY spray PLACE 1 SPRAY UNDER  THE TONGUE EVERY 5 MINUTES FOR 3 DOSES AS NEEDED FOR CHEST PAIN.  Marland Kitchen pravastatin (PRAVACHOL) 10 MG tablet TAKE ONE TABLET (10 MG TOTAL) BY MOUTH DAILY.  Marland Kitchen rOPINIRole (REQUIP) 0.25 MG tablet Take 1 tablet (0.25 mg total) by mouth daily. Take two hours prior to bedtime every night  . silver nitrate applicators 31-51 % applicator Use as needed to stop superficial skin bleeding.  Marland Kitchen spironolactone (ALDACTONE) 25 MG tablet TAKE ONE TABLET (25MG TOTAL) BY MOUTH DAILY  . torsemide (DEMADEX) 20 MG tablet Take 2 tablets (40 mg total) by mouth daily.  Marland Kitchen ULORIC 40 MG tablet Take 1 tablet (40 mg total) by mouth at bedtime.  Marland Kitchen UNABLE TO FIND Med Name: So Clean CPAP cleaning system (Patient taking differently: Med Name: BiPap)  . VASCEPA 1 g capsule TAKE 2 CAPSULES BY MOUTH TWICE A DAY  . Wheat Dextrin (BENEFIBER) POWD Take 1 Dose by mouth daily.      Allergies:   Avandia [rosiglitazone maleate], Cephalexin, Clindamycin hcl, Humalog [insulin lispro], Lincomycin, Metformin, Novolog [insulin aspart], Penicillins, Rosiglitazone, Tizanidine, Adhesive [tape], Atorvastatin, Cholestyramine, Gentamycin [gentamicin], Iodinated diagnostic agents, Limonene, Pravastatin, Prednisone, Ranexa [ranolazine], Rosuvastatin, Simvastatin, Strawberry extract, Clopidogrel, Codeine, Erythromycin, Hydrocodone-acetaminophen, Latex, Neomycin, Nickel, Ranitidine, Tamiflu [oseltamivir], and Tramadol   Social History   Tobacco Use  . Smoking status: Never Smoker  . Smokeless tobacco: Never Used  Vaping Use  . Vaping Use: Never used  Substance Use Topics  . Alcohol use: No    Alcohol/week: 0.0 standard drinks  . Drug use: No     Family Hx: The patient's family history includes Breast cancer (age of onset: 56) in her sister; Cancer (age of onset: 64) in her sister; Cancer (age of onset: 62) in her maternal grandfather; Diabetes in her father; Glaucoma in her brother; Heart attack in her father, maternal grandmother, and mother; Heart  disease in her father and mother; Hypertension  in her brother; Lung cancer in her sister. There is no history of Colon cancer or Stomach cancer.  ROS:   Please see the history of present illness.     All other systems reviewed and are negative.   Labs/Other Tests and Data Reviewed:    Recent Labs: 04/25/2020: ALT 19; BUN 62; Creatinine, Ser 1.77; Hemoglobin 12.1; Platelets 124; Potassium 4.8; Sodium 136   Recent Lipid Panel Lab Results  Component Value Date/Time   CHOL 109 04/20/2020 09:50 AM   TRIG 209 (H) 04/20/2020 09:50 AM   HDL 33 (L) 04/20/2020 09:50 AM   CHOLHDL 3.3 04/20/2020 09:50 AM   LDLCALC 34 04/20/2020 09:50 AM   LDLDIRECT 43.0 12/14/2014 09:38 AM    Wt Readings from Last 3 Encounters:  07/05/20 113 lb (51.3 kg)  05/02/20 115 lb 4.8 oz (52.3 kg)  04/20/20 116 lb (52.6 kg)     Objective:    Vital Signs:  BP 126/67   Pulse 69   Ht 5' 3"  (1.6 m)   Wt 113 lb (51.3 kg)   SpO2 96%   BMI 20.02 kg/m   Well nourished, well developed female in no acute distress. Well appearing, alert and conversant, regular work of breathing,  good skin color  Eyes- anicteric mouth- oral mucosa is pink  neuro- grossly intact skin- no apparent rash or lesions or cyanosis   ASSESSMENT & PLAN:    1. OSA - The patient is tolerating PAP therapy well without any problems. The PAP download was reviewed today and showed an AHI of 4.2/hr on 15/11 cm H2O with 100% compliance in using more than 4 hours nightly.  The patient has been using and benefiting from PAP use and will continue to benefit from therapy.  -she has a lot of problems with mouth dryness but cannot up titrate the humidity further due to condensation in her  tubing.  -she uses Biotene mouth wash -suspect some of her dry mouth is related to her diuretics  2.  HTN -continue Lopressor 21m BID, Imdur 1230mdaily and Sprio 2556maily   3.  Restless Legs syndrome -significantly improved on Requip  -continue Requip as  needed  COVID-19 Education: The signs and symptoms of COVID-19 were discussed with the patient and how to seek care for testing (follow up with PCP or arrange E-visit).  The importance of social distancing was discussed today.  Patient Risk:   After full review of this patient's clinical status, I feel that they are at least moderate risk at this time.  Time:   Today, I have spent 20 minutes on telemedicine discussing medical problems including OSA< HTN, RLS and reviewing patient's chart including PAP compliance download.  Medication Adjustments/Labs and Tests Ordered: Current medicines are reviewed at length with the patient today.  Concerns regarding medicines are outlined above.  Tests Ordered: No orders of the defined types were placed in this encounter.  Medication Changes: No orders of the defined types were placed in this encounter.   Disposition:  Follow up in 6 months   Signed, TraFransico HimD  07/05/2020 11:06 AM    [name]

## 2020-07-05 NOTE — Patient Instructions (Signed)
Medication Instructions:  Your physician recommends that you continue on your current medications as directed. Please refer to the Current Medication list given to you today.  *If you need a refill on your cardiac medications before your next appointment, please call your pharmacy*  Follow-Up: At [facility], you and your health needs are our priority.  As part of our continuing mission to provide you with exceptional heart care, we have created designated Provider Care Teams.  These Care Teams include your primary Cardiologist (physician) and Advanced Practice Providers (APPs -  Physician Assistants and Nurse Practitioners) who all work together to provide you with the care you need, when you need it.  Your next appointment:   6 month(s)  The format for your next appointment:   In Person  Provider:   Fransico Him, MD

## 2020-07-05 NOTE — Telephone Encounter (Signed)
Manual transmission reviewed, advised pt of data.  Nothing urgent, pt is scheduled to see DR. Lovena Le tomorrow afternoon.

## 2020-07-05 NOTE — Telephone Encounter (Signed)
New message     1. Has your device fired? no  2. Is you device beeping? no  3. Are you experiencing draining or swelling at device site? no  4. Are you calling to see if we received your device transmission? Patient sending transmission , she has been having some AFIB episodes and Dr Radford Pax suggested that she have it checked to see if you can see anything  5. Have you passed out? no    Please route to [place]

## 2020-07-06 ENCOUNTER — Ambulatory Visit: Payer: Medicare PPO | Admitting: Internal Medicine

## 2020-07-06 ENCOUNTER — Encounter: Payer: Self-pay | Admitting: Internal Medicine

## 2020-07-06 ENCOUNTER — Other Ambulatory Visit: Payer: Self-pay

## 2020-07-06 VITALS — BP 124/58 | HR 78 | Ht 62.5 in | Wt 117.8 lb

## 2020-07-06 DIAGNOSIS — I495 Sick sinus syndrome: Secondary | ICD-10-CM

## 2020-07-06 DIAGNOSIS — I5032 Chronic diastolic (congestive) heart failure: Secondary | ICD-10-CM

## 2020-07-06 DIAGNOSIS — I10 Essential (primary) hypertension: Secondary | ICD-10-CM

## 2020-07-06 LAB — CUP PACEART INCLINIC DEVICE CHECK
Brady Statistic AP VP Percent: 0.1 % — CL
Brady Statistic AP VS Percent: 4 %
Brady Statistic AS VP Percent: 0.3 %
Brady Statistic AS VS Percent: 95.7 %
Date Time Interrogation Session: 20211215143757
Implantable Lead Implant Date: 20140130
Implantable Lead Implant Date: 20140130
Implantable Lead Location: 753859
Implantable Lead Location: 753860
Implantable Lead Model: 5076
Implantable Lead Model: 5076
Implantable Pulse Generator Implant Date: 20140130
Lead Channel Pacing Threshold Amplitude: 0.5 V
Lead Channel Pacing Threshold Amplitude: 0.75 V
Lead Channel Pacing Threshold Pulse Width: 0.4 ms
Lead Channel Pacing Threshold Pulse Width: 0.4 ms
Lead Channel Sensing Intrinsic Amplitude: 31 mV
Lead Channel Sensing Intrinsic Amplitude: 4.6 mV

## 2020-07-06 NOTE — Progress Notes (Signed)
HPI [name] [name] returns today for PPM followup. She is a pleasant 69 yo woman with a h/o CHB, s/p PPM, AS, s/p AVR, PAF, and easy bleeding. She has had symptoms which are poorly characterized. She has a "jiggly feeling at the top part of her chest/base of the neck. She is pending a PET scan as she has a lung nodule that has grown in size. She has rare palpitations. No syncope. Allergies  Allergen Reactions  . Avandia [Rosiglitazone Maleate] Other (See Comments)    Congestive heart failure  . Cephalexin Anaphylaxis, Swelling and Rash    Extremities swell , throat swelling   . Clindamycin Hcl Anaphylaxis and Shortness Of Breath  . Humalog [Insulin Lispro] Itching     big knots and whelp   . Lincomycin Anaphylaxis and Shortness Of Breath  . Metformin Other (See Comments)    Congestive heart failure  . Novolog [Insulin Aspart] Hives and Itching    Humalog & Novolog big knots and whelp    . Penicillins Anaphylaxis, Hives, Swelling and Rash    Did it involve swelling of the face/tongue/throat, SOB, or low BP? Yes Did it involve sudden or severe rash/hives, skin peeling, or any reaction on the inside of your mouth or nose? No Did you need to seek medical attention at a hospital or doctor's office? Yes When did it last happen?Childhood allergy  If all above answers are "NO", may proceed with cephalosporin use.   [name] [name] Kitchen Rosiglitazone Other (See Comments)    Congestive heart failure  . Tizanidine Other (See Comments)    Dizziness, Mental Status Changes, Hallucination    . Adhesive [Tape] Other (See Comments)    Tears skin, Please use "paper" tape  . Atorvastatin Other (See Comments)    increased LFT's, no muscle weakness, leg pain Muscle and joint pain increased LFT's  Increased LFTs  . Cholestyramine Other (See Comments)    Liver Disorder Elevated LFTs    . Gentamycin [Gentamicin] Hives, Itching and Rash    Fine red bumps.  Reaction noted post loop recorder implant,  where gentamycin was used for irrigation. Topical prep  . Iodinated Diagnostic Agents Other (See Comments)    PAIN DURING LYMPHANGIOGRAM '81, NO ALLERGY TO IV CONTRAST.Has had procedures with Iodine since without problems.   . Limonene Hives, Itching, Rash and Other (See Comments)    Reaction noted post loop recorder implant, where gentamycin was used for irrigation. Topical prep  . Pravastatin Other (See Comments)    In high doses (20-47m) causes muscle and joint pain  Per pt. "can only take in low doses".   . Prednisone Itching and Other (See Comments)    B/P went high, itching all over.  Tolerates with benadryl   . Ranexa [Ranolazine] Other (See Comments)    Nausea, dizziness, low blood sugar, tingling in right hand, "blood blisters" on tongue   . Rosuvastatin Nausea And Vomiting, Nausea Only and Other (See Comments)    Muscle and joint pain & increased LFTs; tolerates Pravastatin 136m(max)  . Simvastatin Other (See Comments)    Increased LFT's   . Strawberry Extract Hives, Itching, Rash and Other (See Comments)    Reaction noted post loop recorder implant, where gentamycin was used for irrigation. Topical prep.  Can eat fresh strawberries   . Clopidogrel Other (See Comments)    Does not work for patient Medicine was not effective  . Codeine Nausea And Vomiting  . Erythromycin Nausea And Vomiting  .  Hydrocodone-Acetaminophen Itching, Rash and Other (See Comments)    itching  . Latex Itching and Other (See Comments)    I"if wear gloves hands get red ,itchy no prioblem if other wear and touch me"  . Neomycin Rash and Other (See Comments)    Redness  . Nickel Itching  . Ranitidine Nausea Only  . Tamiflu [Oseltamivir] Nausea And Vomiting  . Tramadol Nausea Only     Current Outpatient Medications  Medication Sig Dispense Refill  . acyclovir ointment (ZOVIRAX) 5 % Apply 1 application topically every 3 (three) hours as needed (fever blister/cold sores).    . ALPRAZolam  (XANAX) 0.5 MG tablet Take 1 tablet (0.5 mg total) by mouth 2 (two) times daily as needed for anxiety. 60 tablet 1  . APIDRA 100 UNIT/ML injection Inject 40 Units into the skin as directed. INFUSE THROUGH INSULIN PUMP UTD, Bolus depends on carb intake  2  . aspirin EC 81 MG tablet Take 81 mg by mouth at bedtime.    [name] [name] Kitchen BRILINTA 60 MG TABS tablet TAKE ONE TABLET (60MG TOTAL) BY MOUTH TWICE DAILY 180 tablet 3  . clobetasol (OLUX) 0.05 % topical foam Apply 1 application topically 2 (two) times daily as needed (skin flaking).    . Coenzyme Q10 (CO Q 10) 100 MG CAPS Take 100 mg by mouth at bedtime.     . colchicine 0.6 MG tablet Take 0.6 mg by mouth daily as needed (gout flare).    . CONTOUR NEXT TEST test strip USE TO TEST BLOOD SUGAR UP TO EIGHT TIMES A DAY    . diclofenac Sodium (VOLTAREN) 1 % GEL as needed.    . docusate sodium (COLACE) 100 MG capsule Take 100 mg by mouth 2 (two) times daily.    [name] [name] Kitchen erythromycin with ethanol (THERAMYCIN) 2 % external solution     . Evolocumab (REPATHA SURECLICK) 326 MG/ML SOAJ Inject 140 mg into the skin every 14 (fourteen) days. 6 pen 3  . famotidine (PEPCID) 40 MG tablet Take 40 mg by mouth daily.    [name] [name] Kitchen glucagon (GLUCAGON EMERGENCY) 1 MG injection Inject 1 mg into the vein once as needed (low blood sugar).     . isosorbide mononitrate (IMDUR) 120 MG 24 hr tablet TAKE ONE TABLET (120MG TOTAL) BY MOUTH DAILY 90 tablet 3  . latanoprost (XALATAN) 0.005 % ophthalmic solution Place 1 drop into both eyes at bedtime.    . metolazone (ZAROXOLYN) 2.5 MG tablet Take 2.5 mg (1 tab) once as directed by CHF clinic. Call before taking [phone] 3 tablet 0  . metoprolol tartrate (LOPRESSOR) 50 MG tablet Take 1 tablet (50 mg total) by mouth 2 (two) times daily. 60 tablet 3  . mometasone (NASONEX) 50 MCG/ACT nasal spray Place into the nose.    . mupirocin cream (BACTROBAN) 2 % Apply 1 application topically 2 (two) times daily as needed. 90 g 0  . nitroGLYCERIN (NITROLINGUAL) 0.4  MG/SPRAY spray PLACE 1 SPRAY UNDER THE TONGUE EVERY 5 MINUTES FOR 3 DOSES AS NEEDED FOR CHEST PAIN. 14.7 g 0  . pravastatin (PRAVACHOL) 10 MG tablet TAKE ONE TABLET (10 MG TOTAL) BY MOUTH DAILY. 90 tablet 3  . rOPINIRole (REQUIP) 0.25 MG tablet Take 1 tablet (0.25 mg total) by mouth daily. Take two hours prior to bedtime every night 90 tablet 3  . silver nitrate applicators 33-82 % applicator Use as needed to stop superficial skin bleeding. 10 each 0  . spironolactone (ALDACTONE) 25 MG tablet TAKE ONE TABLET (25MG  TOTAL) BY MOUTH DAILY 90 tablet 3  . torsemide (DEMADEX) 20 MG tablet Take 2 tablets (40 mg total) by mouth daily. 180 tablet 3  . ULORIC 40 MG tablet Take 1 tablet (40 mg total) by mouth at bedtime. 90 tablet 3  . UNABLE TO FIND Med Name: So Clean CPAP cleaning system (Patient taking differently: Med Name: BiPap) 1 each 0  . VASCEPA 1 g capsule TAKE 2 CAPSULES BY MOUTH TWICE A DAY 360 capsule 3  . Wheat Dextrin (BENEFIBER) POWD Take 1 Dose by mouth daily.      No current facility-administered medications for this visit.     Past Medical History:  Diagnosis Date  . Anxiety   . Aortic stenosis    a. bicuspid aortic valve; mean gradient of 20 mmHg in 2/10; b. s/p bioprosth AVR (19-mm Edwards pericardial valve) with a redo coronary artery bypass graft procedure in 07/2009; postoperative Dressler's syndrome    . Arthritis   . Breast cancer ([place])   . Carcinoma, renal cell ([place]) 05/2007   Laparoscopic right nephrectomy  . 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 Chronic diastolic CHF (congestive heart failure) ([place])    a. 9.2014 EchoP EF 60-65%, no rwma, bioprosth AVR, mean gradient of 58mHg, mild to mod MR, PASP 546mg.  . CKD (chronic kidney disease), stage II   . Colitis, ischemic ([facility]2008  . Coronary artery disease    a. initial CABG with SVG-RCA in 12/05. b. redo SVG-RCA and bioprosthetic AVR in 1/11. d. 9/14 - PCI with DES to distal LM/proximal LAD was done rather than  bypass as she would have been a poor candidate for redo sternotomy. d. S/p DES to prox LM 05/2014.  . Diabetes mellitus 03/2010    TYPE II: Hemoglobin A1c of 7.4 in  03/2010; 8.4 in 06/2010; treated with insulin pump  . Duodenal ulcer    Remote; H. pylori positive  . Gastroparesis due to DM (HCAline10/04/2012  . Genetic testing 01/14/2017   Ms. Eickhoff underwent genetic counseling and testing for hereditary cancer syndromes on 01/01/2017. Her results were negative for mutations in all 83 genes analyzed by [facility]'s 83-gene Common Hereditary Cancers Panel. Genes analyzed include: ALK, APC, ATM, AXIN2, BAP1, BARD1, BLM, BMPR1A, BRCA1, BRCA2, BRIP1, CASR, CDC73, CDH1, CDK4, CDKN1B, CDKN1C, CDKN2A, CEBPA, CHEK2, CTNNA1, DICER1, DIS3L2,   . Hodgkin's disease ([facility]. [facility]1991   [place]le radiation therapy  . Hodgkin's disease ([place]  . Hyperlipidemia   . Hyperplastic gastric polyp 06/23/2012  . Hypertension   . Iron deficiency anemia    a. 03/2013 EGD: essentially normal. Possible slow GIB.  . Migraines   . NASH (nonalcoholic steatohepatitis) 1999   -biopsy in 1999  . OSA on CPAP 04/24/2017   Moderate with AHI 22/hr now on CPAP at 18cm H2o  . Osteopenia     hip on DEXA in October 2007.  . Osteoporosis   . Oxygen deficiency   . Pacemaker    six sinus syndrome  . PAF (paroxysmal atrial fibrillation) (HCH[facility]   a.  h/o brief episodes noted on ppm interrogation. b. given GI bleeding and need to be on both ASA 81 and Brilinta, risks likely outweigh benefits of anticoagulation.   . Renal insufficiency   . Small bowel obstruction (HCC)    Recurrent; resolved after resection of a lipoma  . Syncope    a. H/o recurrent syncope with pauses on loop recorder s/p Medtronic pacemaker 07/2012.    ROS:  All systems reviewed and negative except as noted in the HPI.   Past Surgical History:  Procedure Laterality Date  . ABDOMINAL HYSTERECTOMY    . AORTIC VALVE REPLACEMENT  2011   Aortic valve replacement surgery  with a 19 mm bioprosthetic device post redo CABG January 2011.  [name] [name] Kitchen BONE MARROW BIOPSY    . BREAST EXCISIONAL BIOPSY     benign, bilateral  . CARDIAC CATHETERIZATION    . CERVICAL BIOPSY    . COLONOSCOPY     Multiple with adenomatous polyps  . COLONOSCOPY  06/23/2012   Procedure: COLONOSCOPY;  Surgeon: Gatha Mayer, MD;  Location: [facility] ENDOSCOPY;  Service: Endoscopy;  Laterality: N/A;  . CORONARY ARTERY BYPASS GRAFT  2005, 2011   CABG-most recent SVG to RCA-12/05; RCA occluded with patent graft in 2006; Redo bypass in 07/2009  . ESOPHAGOGASTRODUODENOSCOPY    . ESOPHAGOGASTRODUODENOSCOPY  06/23/2012   Procedure: ESOPHAGOGASTRODUODENOSCOPY (EGD);  Surgeon: Gatha Mayer, MD;  Location: [facility];  Service: Endoscopy;  Laterality: N/A;  . ESOPHAGOGASTRODUODENOSCOPY N/A 04/06/2013   Procedure: ESOPHAGOGASTRODUODENOSCOPY (EGD);  Surgeon: Irene Shipper, MD;  Location: [facility] ENDOSCOPY;  Service: Endoscopy;  Laterality: N/A;  . EXPLORATORY LAPAROTOMY W/ BOWEL RESECTION     Small bowel resection of lipoma  . GIVENS CAPSULE STUDY N/A 04/30/2013   Procedure: GIVENS CAPSULE STUDY;  Surgeon: Gatha Mayer, MD;  Location: [place];  Service: Endoscopy;  Laterality: N/A;  . LEFT AND RIGHT HEART CATHETERIZATION WITH CORONARY ANGIOGRAM N/A 04/07/2013   Procedure: LEFT AND RIGHT HEART CATHETERIZATION WITH CORONARY ANGIOGRAM;  Surgeon: Larey Dresser, MD;  Location: [facility] CATH LAB;  Service: Cardiovascular;  Laterality: N/A;  . LEFT ATRIAL APPENDAGE OCCLUSION N/A 02/16/2016   Watchman not placed - nickel allergy  . LEFT HEART CATHETERIZATION WITH CORONARY/GRAFT ANGIOGRAM N/A 06/07/2014   Procedure: LEFT HEART CATHETERIZATION WITH Beatrix Fetters;  Surgeon: Burnell Blanks, MD;  Location[facility] CATH LAB;  Service: Cardiovascular;  Laterality: N/A;  . LIVER BIOPSY    . LOOP RECORDER IMPLANT N/A 12/10/2011   Procedure: LOOP RECORDER IMPLANT;  Surgeon: Evans Lance, MD;  Location: [facility] CATH LAB;   Service: Cardiovascular;  Laterality: N/A;  . MALONEY DILATION  06/23/2012   Procedure: [name] DILATION;  Surgeon: Gatha Mayer, MD;  Location: [facility] ENDOSCOPY;  Service: Endoscopy;;  54 fr  . MASTECTOMY Left 05/17/2015   total   . NEPHRECTOMY  2008   Laparoscopic right nephrectomy, renal cell carcinoma  . PACEMAKER INSERTION  08/21/2012   Medtronic Adapta L dual-chamber pacemaker  . PERCUTANEOUS CORONARY STENT INTERVENTION (PCI-S) N/A 04/09/2013   Procedure: PERCUTANEOUS CORONARY STENT INTERVENTION (PCI-S);  Surgeon: Burnell Blanks, MD;  Location[facility] C[facility] LAB;  Service: Cardiovascular;  Laterality: N/A;  . PERMANENT PACEMAKER INSERTION N/A 08/21/2012   Procedure: PERMANENT PACEMAKER INSERTION;  Surgeon: Evans Lance, MD;  Location: [facility] CATH LAB;  Service: Cardiovascular;  Laterality: N/A;  . RIGHT HEART CATH N/A 03/18/2019   Procedure: RIGHT HEART CATH;  Surgeon: Larey Dresser, MD;  Location: [place] CV LAB;  Service: Cardiovascular;  Laterality: N/A;  . RIGHT HEART CATH AND CORONARY/GRAFT ANGIOGRAPHY N/A 04/19/2017   Procedure: RIGHT HEART CATH AND CORONARY/GRAFT ANGIOGRAPHY;  Surgeon: Larey Dresser, MD;  Location: [place] CV LAB;  Service: Cardiovascular;  Laterality: N/A;  . TEE WITHOUT CARDIOVERSION N/A 12/07/2015   Procedure: TRANSESOPHAGEAL ECHOCARDIOGRAM (TEE);  Surgeon: Larey Dresser, MD;  Location: [place];  Service: Cardiovascular;  Laterality: N/A;  .  TOTAL ABDOMINAL HYSTERECTOMY W/ BILATERAL SALPINGOOPHORECTOMY  2000   endometriosis and ovarian cysts  . TOTAL MASTECTOMY Left 05/17/2015   Procedure: LEFT TOTAL MASTECTOMY;  Surgeon: Rolm Bookbinder, MD;  Location: [place];  Service: General;  Laterality: Left;  . TUMOR EXCISION  1981   removal of Hodgkins lymphoma     Family History  Problem Relation Age of Onset  . Heart attack Mother        d.61  . Heart disease Mother   . Heart attack Father        d.60  . Diabetes Father   . Heart disease  Father   . Lung cancer Sister        d.61 metastases to brain. History of smoking. Maternal half-sister.  . Cancer Sister 30       unspecified gynecologic cancer (either cervical or ovarian) in her 28s.  [name] [name] Kitchen Heart attack Maternal Grandmother   . Cancer Maternal Grandfather 70       d.52s oral cancer  . Hypertension Brother   . Glaucoma Brother   . Breast cancer Sister 62       d.42 from metastatic disease. Paternal half-sister.  . Colon cancer Neg Hx   . Stomach cancer Neg Hx      Social History   Socioeconomic History  . Marital status: Married    Spouse name: Not on file  . Number of children: 2  . Years of education: Not on file  . Highest education level: Not on file  Occupational History  . Occupation: retired    Comment: elementary principal  Tobacco Use  . Smoking status: Never Smoker  . Smokeless tobacco: Never Used  Vaping Use  . Vaping Use: Never used  Substance and Sexual Activity  . Alcohol use: No    Alcohol/week: 0.0 standard drinks  . Drug use: No  . Sexual activity: Yes  Other Topics Concern  . Not on file  Social History Narrative   Retired [employer] principal, married    Started disability September 2013      Has one daughter in [place], works at [employer]. Has a son, Aaron Edelman, who is Retail buyer.   Eats all food groups.   Wear seatbelt.    Attends church. [place], [place]. [place].    Social Determinants of Health   Financial Resource Strain: Not on file  Food Insecurity: Not on file  Transportation Needs: Not on file  Physical Activity: Not on file  Stress: Not on file  Social Connections: Not on file  Intimate Partner Violence: Not on file     BP (!) 124/58   Pulse 78   Ht 5' 2.5" (1.588 m)   Wt 117 lb 12.8 oz (53.4 kg)   SpO2 97%   BMI 21.20 kg/m   Physical Exam:  Well appearing NAD HEENT: Unremarkable Neck:  No JVD, no thyromegally Lymphatics:  No adenopathy Back:  No CVA tenderness Lungs:  Clear with no wheezes HEART:   Regular rate rhythm, no murmurs, no rubs, no clicks Abd:  soft, positive bowel sounds, no organomegally, no rebound, no guarding Ext:  2 plus pulses, no edema, no cyanosis, no clubbing Skin:  No rashes no nodules Neuro:  CN II through XII intact, motor grossly intact   DEVICE  Normal device function.  See PaceArt for details.   Assess/Plan: 1. CHB - she is asymptomatic, s/p PPM insertion. 2. Non-cardiac pain - her symptoms are very infrequent. She will undergo watchful waiting.  3.  CAD - she does not have angina. The chest pain she has had is very different then her prior anginal symptoms.  4. PAF - she has had 0.3% atrial fib. We will follow.  Carleene Overlie Dauntae Derusha,MD

## 2020-07-06 NOTE — Patient Instructions (Signed)
Medication Instructions:  None Today *If you need a refill on your cardiac medications before your next appointment, please call your pharmacy*   Lab Work: None Today If you have labs (blood work) drawn today and your tests are completely normal, you will receive your results only by: Marland Kitchen MyChart Message (if you have MyChart) OR . A paper copy in the mail If you have any lab test that is abnormal or we need to change your treatment, we will call you to review the results.   Testing/Procedures: None Today   Follow-Up: At [facility], you and your health needs are our priority.  As part of our continuing mission to provide you with exceptional heart care, we have created designated Provider Care Teams.  These Care Teams include your primary Cardiologist (physician) and Advanced Practice Providers (APPs -  Physician Assistants and Nurse Practitioners) who all work together to provide you with the care you need, when you need it.  We recommend signing up for the patient portal called "MyChart".  Sign up information is provided on this After Visit Summary.  MyChart is used to connect with patients for Virtual Visits (Telemedicine).  Patients are able to view lab/test results, encounter notes, upcoming appointments, etc.  Non-urgent messages can be sent to your provider as well.   To learn more about what you can do with MyChart, go to [email or web address].    Your next appointment:   12 month(s)  The format for your next appointment:   In Person  Provider:   [name], MD   Other Instructions None Today

## 2020-07-13 DIAGNOSIS — N183 Chronic kidney disease, stage 3 unspecified: Secondary | ICD-10-CM | POA: Diagnosis not present

## 2020-07-13 DIAGNOSIS — N2889 Other specified disorders of kidney and ureter: Secondary | ICD-10-CM | POA: Diagnosis not present

## 2020-07-13 DIAGNOSIS — E1022 Type 1 diabetes mellitus with diabetic chronic kidney disease: Secondary | ICD-10-CM | POA: Diagnosis not present

## 2020-07-13 DIAGNOSIS — K7581 Nonalcoholic steatohepatitis (NASH): Secondary | ICD-10-CM | POA: Diagnosis not present

## 2020-07-13 DIAGNOSIS — E1043 Type 1 diabetes mellitus with diabetic autonomic (poly)neuropathy: Secondary | ICD-10-CM | POA: Diagnosis not present

## 2020-07-13 DIAGNOSIS — K7460 Unspecified cirrhosis of liver: Secondary | ICD-10-CM | POA: Diagnosis not present

## 2020-07-13 DIAGNOSIS — I130 Hypertensive heart and chronic kidney disease with heart failure and stage 1 through stage 4 chronic kidney disease, or unspecified chronic kidney disease: Secondary | ICD-10-CM | POA: Diagnosis not present

## 2020-07-13 DIAGNOSIS — R911 Solitary pulmonary nodule: Secondary | ICD-10-CM | POA: Diagnosis not present

## 2020-07-13 DIAGNOSIS — I509 Heart failure, unspecified: Secondary | ICD-10-CM | POA: Diagnosis not present

## 2020-07-13 DIAGNOSIS — R918 Other nonspecific abnormal finding of lung field: Secondary | ICD-10-CM | POA: Diagnosis not present

## 2020-07-13 DIAGNOSIS — J90 Pleural effusion, not elsewhere classified: Secondary | ICD-10-CM | POA: Diagnosis not present

## 2020-07-17 ENCOUNTER — Encounter (HOSPITAL_COMMUNITY): Payer: Self-pay

## 2020-07-20 ENCOUNTER — Encounter: Payer: Medicare PPO | Admitting: Internal Medicine

## 2020-07-20 NOTE — Progress Notes (Signed)
Remote pacemaker transmission.   

## 2020-07-21 DIAGNOSIS — Z1231 Encounter for screening mammogram for malignant neoplasm of breast: Secondary | ICD-10-CM | POA: Diagnosis not present

## 2020-07-30 DIAGNOSIS — G4731 Primary central sleep apnea: Secondary | ICD-10-CM | POA: Diagnosis not present

## 2020-08-03 DIAGNOSIS — K7460 Unspecified cirrhosis of liver: Secondary | ICD-10-CM | POA: Diagnosis not present

## 2020-08-03 DIAGNOSIS — E049 Nontoxic goiter, unspecified: Secondary | ICD-10-CM | POA: Diagnosis not present

## 2020-08-03 DIAGNOSIS — K559 Vascular disorder of intestine, unspecified: Secondary | ICD-10-CM | POA: Diagnosis not present

## 2020-08-03 DIAGNOSIS — E785 Hyperlipidemia, unspecified: Secondary | ICD-10-CM | POA: Diagnosis not present

## 2020-08-03 DIAGNOSIS — K7581 Nonalcoholic steatohepatitis (NASH): Secondary | ICD-10-CM | POA: Diagnosis not present

## 2020-08-03 DIAGNOSIS — Z86010 Personal history of colonic polyps: Secondary | ICD-10-CM | POA: Diagnosis not present

## 2020-08-03 DIAGNOSIS — I5030 Unspecified diastolic (congestive) heart failure: Secondary | ICD-10-CM | POA: Diagnosis not present

## 2020-08-03 DIAGNOSIS — J90 Pleural effusion, not elsewhere classified: Secondary | ICD-10-CM | POA: Diagnosis not present

## 2020-08-03 DIAGNOSIS — I350 Nonrheumatic aortic (valve) stenosis: Secondary | ICD-10-CM | POA: Diagnosis not present

## 2020-08-03 DIAGNOSIS — R911 Solitary pulmonary nodule: Secondary | ICD-10-CM | POA: Diagnosis not present

## 2020-08-04 DIAGNOSIS — E1021 Type 1 diabetes mellitus with diabetic nephropathy: Secondary | ICD-10-CM | POA: Diagnosis not present

## 2020-08-10 DIAGNOSIS — C50912 Malignant neoplasm of unspecified site of left female breast: Secondary | ICD-10-CM | POA: Diagnosis not present

## 2020-08-11 DIAGNOSIS — L700 Acne vulgaris: Secondary | ICD-10-CM | POA: Diagnosis not present

## 2020-08-11 DIAGNOSIS — D225 Melanocytic nevi of trunk: Secondary | ICD-10-CM | POA: Diagnosis not present

## 2020-08-11 DIAGNOSIS — L818 Other specified disorders of pigmentation: Secondary | ICD-10-CM | POA: Diagnosis not present

## 2020-08-11 DIAGNOSIS — Z1283 Encounter for screening for malignant neoplasm of skin: Secondary | ICD-10-CM | POA: Diagnosis not present

## 2020-08-11 DIAGNOSIS — L218 Other seborrheic dermatitis: Secondary | ICD-10-CM | POA: Diagnosis not present

## 2020-08-16 DIAGNOSIS — I480 Paroxysmal atrial fibrillation: Secondary | ICD-10-CM | POA: Diagnosis not present

## 2020-08-16 DIAGNOSIS — R110 Nausea: Secondary | ICD-10-CM | POA: Diagnosis not present

## 2020-08-16 DIAGNOSIS — N3000 Acute cystitis without hematuria: Secondary | ICD-10-CM | POA: Diagnosis not present

## 2020-08-16 DIAGNOSIS — F419 Anxiety disorder, unspecified: Secondary | ICD-10-CM | POA: Diagnosis not present

## 2020-08-16 DIAGNOSIS — B373 Candidiasis of vulva and vagina: Secondary | ICD-10-CM | POA: Diagnosis not present

## 2020-08-16 DIAGNOSIS — J019 Acute sinusitis, unspecified: Secondary | ICD-10-CM | POA: Diagnosis not present

## 2020-08-18 DIAGNOSIS — E1122 Type 2 diabetes mellitus with diabetic chronic kidney disease: Secondary | ICD-10-CM | POA: Diagnosis not present

## 2020-08-18 DIAGNOSIS — I509 Heart failure, unspecified: Secondary | ICD-10-CM | POA: Diagnosis not present

## 2020-08-18 DIAGNOSIS — N184 Chronic kidney disease, stage 4 (severe): Secondary | ICD-10-CM | POA: Diagnosis not present

## 2020-08-18 DIAGNOSIS — K7460 Unspecified cirrhosis of liver: Secondary | ICD-10-CM | POA: Diagnosis not present

## 2020-08-18 DIAGNOSIS — D649 Anemia, unspecified: Secondary | ICD-10-CM | POA: Diagnosis not present

## 2020-08-18 DIAGNOSIS — K7581 Nonalcoholic steatohepatitis (NASH): Secondary | ICD-10-CM | POA: Diagnosis not present

## 2020-08-19 ENCOUNTER — Other Ambulatory Visit: Payer: Self-pay

## 2020-08-19 ENCOUNTER — Encounter (HOSPITAL_COMMUNITY): Payer: Self-pay | Admitting: Cardiology

## 2020-08-19 ENCOUNTER — Ambulatory Visit (HOSPITAL_COMMUNITY)
Admission: RE | Admit: 2020-08-19 | Discharge: 2020-08-19 | Disposition: A | Discharge status: Home / Self Care | Payer: Medicare PPO | Source: Ambulatory Visit | Attending: Cardiology | Admitting: Cardiology

## 2020-08-19 VITALS — BP 130/60 | HR 77

## 2020-08-19 DIAGNOSIS — Z905 Acquired absence of kidney: Secondary | ICD-10-CM | POA: Insufficient documentation

## 2020-08-19 DIAGNOSIS — I480 Paroxysmal atrial fibrillation: Secondary | ICD-10-CM | POA: Diagnosis not present

## 2020-08-19 DIAGNOSIS — Z853 Personal history of malignant neoplasm of breast: Secondary | ICD-10-CM | POA: Diagnosis not present

## 2020-08-19 DIAGNOSIS — R0989 Other specified symptoms and signs involving the circulatory and respiratory systems: Secondary | ICD-10-CM | POA: Insufficient documentation

## 2020-08-19 DIAGNOSIS — E785 Hyperlipidemia, unspecified: Secondary | ICD-10-CM | POA: Insufficient documentation

## 2020-08-19 DIAGNOSIS — K760 Fatty (change of) liver, not elsewhere classified: Secondary | ICD-10-CM | POA: Diagnosis not present

## 2020-08-19 DIAGNOSIS — E1022 Type 1 diabetes mellitus with diabetic chronic kidney disease: Secondary | ICD-10-CM | POA: Diagnosis not present

## 2020-08-19 DIAGNOSIS — I440 Atrioventricular block, first degree: Secondary | ICD-10-CM | POA: Insufficient documentation

## 2020-08-19 DIAGNOSIS — Z955 Presence of coronary angioplasty implant and graft: Secondary | ICD-10-CM | POA: Insufficient documentation

## 2020-08-19 DIAGNOSIS — Z7982 Long term (current) use of aspirin: Secondary | ICD-10-CM | POA: Diagnosis not present

## 2020-08-19 DIAGNOSIS — I130 Hypertensive heart and chronic kidney disease with heart failure and stage 1 through stage 4 chronic kidney disease, or unspecified chronic kidney disease: Secondary | ICD-10-CM | POA: Diagnosis not present

## 2020-08-19 DIAGNOSIS — E1043 Type 1 diabetes mellitus with diabetic autonomic (poly)neuropathy: Secondary | ICD-10-CM | POA: Insufficient documentation

## 2020-08-19 DIAGNOSIS — I5032 Chronic diastolic (congestive) heart failure: Secondary | ICD-10-CM

## 2020-08-19 DIAGNOSIS — G4733 Obstructive sleep apnea (adult) (pediatric): Secondary | ICD-10-CM | POA: Diagnosis not present

## 2020-08-19 DIAGNOSIS — D696 Thrombocytopenia, unspecified: Secondary | ICD-10-CM | POA: Diagnosis not present

## 2020-08-19 DIAGNOSIS — Z9641 Presence of insulin pump (external) (internal): Secondary | ICD-10-CM | POA: Diagnosis not present

## 2020-08-19 DIAGNOSIS — Z950 Presence of cardiac pacemaker: Secondary | ICD-10-CM | POA: Insufficient documentation

## 2020-08-19 DIAGNOSIS — Z8571 Personal history of Hodgkin lymphoma: Secondary | ICD-10-CM | POA: Insufficient documentation

## 2020-08-19 DIAGNOSIS — I2510 Atherosclerotic heart disease of native coronary artery without angina pectoris: Secondary | ICD-10-CM | POA: Insufficient documentation

## 2020-08-19 DIAGNOSIS — N184 Chronic kidney disease, stage 4 (severe): Secondary | ICD-10-CM | POA: Diagnosis not present

## 2020-08-19 DIAGNOSIS — Z794 Long term (current) use of insulin: Secondary | ICD-10-CM | POA: Diagnosis not present

## 2020-08-19 DIAGNOSIS — Z953 Presence of xenogenic heart valve: Secondary | ICD-10-CM | POA: Insufficient documentation

## 2020-08-19 DIAGNOSIS — Z7902 Long term (current) use of antithrombotics/antiplatelets: Secondary | ICD-10-CM | POA: Insufficient documentation

## 2020-08-19 DIAGNOSIS — Z79899 Other long term (current) drug therapy: Secondary | ICD-10-CM | POA: Insufficient documentation

## 2020-08-19 NOTE — Patient Instructions (Addendum)
No Labs done today.   No medication changes were made. Please continue all current medications as prescribed.  Your physician recommends that you schedule a follow-up appointment in: 3 months   If you have any questions or concerns before your next appointment please send us a message through mychart or call our office at [phone].    TO LEAVE A MESSAGE FOR THE NURSE SELECT OPTION 2, PLEASE LEAVE A MESSAGE INCLUDING: . YOUR NAME . DATE OF BIRTH . CALL BACK NUMBER . REASON FOR CALL**this is important as we prioritize the call backs  YOU WILL RECEIVE A CALL BACK THE SAME DAY AS LONG AS YOU CALL BEFORE 4:00 PM   Do the following things EVERYDAY: 1) Weigh yourself in the morning before breakfast. Write it down and keep it in a log. 2) Take your medicines as prescribed 3) Eat low salt foods--Limit salt (sodium) to 2000 mg per day.  4) Stay as active as you can everyday 5) Limit all fluids for the day to less than 2 liters   At [facility], you and your health needs are our priority. As part of our continuing mission to provide you with exceptional heart care, we have created designated Provider Care Teams. These Care Teams include your primary Cardiologist (physician) and Advanced Practice Providers (APPs- Physician Assistants and Nurse Practitioners) who all work together to provide you with the care you need, when you need it.   You may see any of the following providers on your designated Care Team at your next follow up: . Dr Daniel Bensimhon . Dr [name] McLean . Amy Clegg, NP . Brittainy Simmons, PA . Lauren Kemp, PharmD   Please be sure to bring in all your medications bottles to every appointment.   

## 2020-08-20 ENCOUNTER — Other Ambulatory Visit: Payer: Self-pay | Admitting: Cardiology

## 2020-08-21 NOTE — Progress Notes (Signed)
Date:  08/21/2020   ID:  Lauriana, Denes 1950-09-13, MRN [id number]   Provider location: [address], [place] Type of Visit: Established patient  PCP:  [name], MD  Cardiologist:  No primary care provider on file. Primary HF: Dr. Aundra Dubin   History of Present Illness: [name] [name] is a 70 y.o. female who has a history of CAD s/p CABG in 12/05 and redo in 1/11, bioprosthetic AVR, diastolic CHF, and sick sinus syndrome s/p PCM presents for cardiology followup.  She had initial CABG with SVG-RCA in 12/05 followed by redo SVG-RCA and bioprosthetic AVR in 1/11.  She has had diastolic CHF and is on Lasix.  In 9/14, she had a CPX test.  This showed severe functional limitation with ischemic ECG changes (inferolateral ST depression and ST elevation in V1 and V2). She had chest pain.  She had R/LHC in 9/14.  This showed elevated left and right heart filling pressures, patent SVG-PDA, and 80% distal LM/80% ostial LAD stenosis.  She was not a candidate for redo CABG (had 2 prior sternotomies as well as chest radiation for Hodgkins lymphoma).  She had DES from left main into proximal LAD and will need long-term DAPT => Brilinta was used as she has been a poor responder to Plavix in the past.  She re-developed angina in 11/15 and was admitted with unstable angina.  LHC showed 80% ostial LM and patent SVG-RCA. She had DES to ostial LM. Echo in 1/18 showed EF 60-65%, normal bioprosthetic aortic valve, mild-moderate MR, PASP 35 mmHg.   She has chronic iron deficiency anemia thought to be due to a slow GI bleed.  Last EGD was unremarkable and a capsule endoscopy was also unremarkable.  She has had periodic transfusions.    She had bilateral mastectomy for DCIS in 78/67 without complication.  She has an insulin pump followed by an endocrinologist at [facility].   Given myalgias with statins, she was started on Praluent.  LDL is now excellent.    In the past she has had short  runs of atrial fibrillation.  She is not anticoagulated. Planned for Watchman placement, but given her nickel allergy, we were unable to place a Watchman.     Diagnosed with OSA, now on CPAP.   Given ongoing problems with exertional dyspnea and occasional chest pain, Cardiolite was repeated in 9/18 and showed inferior changes.  She underwent left/right heart cath in 9/18.  This showed elevated filling pressures, R>L.  There was 75+% ostial LCx stenosis.  However, there were 2 layers of stent from the mid left main into the ostial LAD, and LCx intervention was thought to be very risky.  It was decided to manage her medically.  Echo was repeated in 10/18 showing EF 55-60%, moderate to severe TR.    Given recurrent chest pain, she had ETT-Cardiolite done in 3/19.  This was a normal study with no evidence for ischemia or infarction.  Echo in 1/20 showed EF 55-60%, mild LVH, stable bioprosthetic aortic valve, moderate-severe TR.    Recently, she has been more short of breath with exertion and renal function has been worse.  She was set up for echo and RHC.  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  RHC in 8/20 showed primarily  right-sided HF.  Cardiolite in 8/20 showed no ischemia or infarction.  Torsemide was increased and she cut back on sodium in her diet.    CPX in 11/20 showed severe functional limitation from HF.   She saw Dr. Mosetta Pigeon at [facility] for evaluation for heart/kidney transplant (for restrictive cardiomyopathy).  She was determined to be a poor surgical candidate due to extensive vascular calcifications.   CT chest in 8/21 showed a loculated right pleural effusion but unable to find enough fluid by ultrasound for thoracentesis.   Echo in 9/21 showed EF 60-65%  with mild LVH, mild RV dilation with mildly decreased systolic function and a D-shaped septum, PASP 40, normal bioprosthetic aortic valve, rheumatic-appearing MV with mild-moderate mitral stenosis (mean gradient 9 but MVA by PHT 2.7 cm^2), trivial MR.   She returns today for followup of CHF and CAD.  Last Saturday, she was carrying laundary and developed ringing in her ears and "fuzzy vision."  She sat down and then either "fell asleep" or passed out, she is not sure which.  This lasted only for a few seconds she thinks.  No palpitations.  Device interrogation showed no high rate events that day.  She has had similar episodes with her "ears ringing" infrequently in the past.  Overall, she has been stable symptomatically.  No dyspnea unless she "hurries."  She is able to go on trips with her husband as long as she paces herself.  Planning a trip to [place] soon.  Weight up about 3 lbs.  No orthopnea/PND.  No chest pain.   [employer] PPM interrogation: No high rate event on 1/22 (the day of ?syncope).  0.2% v-pacing.     ECG (personally reviewed): NSR, long 1st degree AVB, LAFB, LVH  Labs (2/19): hgb 13.2 Labs @ [facility] (3/19): K 4.7, creatinine 1.4, ANA negative, RF negative Labs (4/19): K 3.9, creatinine 1.52 => 1.63 Labs (6/19): LDL 8, HDL 42, TGs 259 Labs (9/19): K 4.9, creatinine 1.65, hgb 13.4, plts 129, LDL 46,TGs 182 Labs (12/19): K 4.9, creatinine 2.07, hgb 12.4 Labs (3/20): K 4, creatinine 1.8 Labs (6/20): K 4.7, creatinine 1.47 Labs (8/20): creatinine 2.13 => 2.46 => 2.4, LDL 22 Labs (9/20): K 4.8, creatinine 2.74, AST/ALT normal, hgb 12.6 Labs (1/21): K 4.1, creatinine 1.86, hgb 11.7 Labs (4/21): K 4.7, creatinine 1.7 Labs (6/21): K 4.2, creatinine 2 => 1.7 Labs (8/21): K 3.8, creatinine 1.69, hgb 10.6, plts 116 Labs (9/21): LDL 34, TGs 209 Labs (1/22): K 3.8, creatinine 2.0  PMH: 1. Sick sinus syndrome s/p [employer] PCM.  2. HTN 3. H/o SBO 4. Renal cell carcinoma s/p right  nephrectomy in 2008.  5. 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  Fibroscan in 11/14 showed advanced fibrosis concerning for cirrhosis. EGD in 11/14 showed no varices.  6. Diastolic CHF: Echo (7/26) with EF 60-65%, bioprosthetic aortic valve with mean gradient 15 mmHg, mild MR.  CPX (9/14): peak VO2 10.4, VE/VCO2 43.8, inferolateral ST depression and ST elevation in V1/V2 with severe dyspnea and chest pain, severe functional limitation with ischemic changes.  Echo (9/14) with EF 60-65%, normal RV size and systolic function, mild-moderate MR, bioprosthetic aortic valve normal, PA systolic pressure 51 mmHg, no evidence for pericardial constriction though exam incomplete for constriction.  Echo (11/15) with EF 55-60%, bioprosthetic aortic valve, mild-moderate MR, moderate TR, PA systolic pressure 42 mmHg.  - TEE (5/17) with EF 60-65%, normal bioprosthetic aortic valve with mean gradient 13 mmHg, normal RV size and systolic function. - Echo (  1/18) with EF 60-65%, normal bioprosthetic aortic valve, mild-moderate MR, PASP 35 mmHg.  - Echo (10/18) with EF 55-60%, bioprosthetic aortic valve mean gradient 10 mmHg, MAC with mild mitral stenosis (mean gradient 7) and mild mitral regurgitation, moderate to severe TR, PASP 43 mmHg.  - RHC (9/18): mean RA 13, PA 44/18 mean 33, mean PCWP 19, CI 3.45, PVR 2.7 WU.  - Echo (1/20): EF 55-60%, mild LVH, mild MR, stable bioprosthetic aortic valve, moderate-severe TR.  - RHC (8/20): mean RA 14, PA 43/15 mean 28, mean PCWP 13, PAPI 2, PVR 4.2, CI 2.31 - Echo (8/20): EF 55-60%, RV mildly dilated with normal systolic function, mildly D-shaped septum suggestive of RV pressure/volume overload, rheumatic-appearing mitral valve with mild mitral stenosis (mean gradient 6, MVA by PHT 4 cm^2), no evidence for pericardial constriction, moderate TR, bioprosthetic aortic valve functioning normally with mean gradient 6 mmHg.  - CPX (11/20): peak VO2 9.8, VE/VCO2 slope 38, RER 1.11 => severe  functional limitation from HF.   - Echo (9/21): EF 60-65% with mild LVH, mild RV dilation with mildly decreased systolic function and a D-shaped septum, PASP 40, normal bioprosthetic aortic valve, rheumatic-appearing MV with mild-moderate mitral stenosis (mean gradient 9 but MVA by PHT 2.7 cm^2, trivial MR.  7. TAH-BSO 8. Type I diabetes: Has insulin pump.  9. H/o ischemic colitis. 10. H/o PUD. 11. Diabetic gastroparesis. 12. Hyperlipidemia: Myalgias with Crestor and atorvastatin, elevated LFTs with simvastatin.  Myalgias with > 10 mg pravastatin. Now on Praluent.  13. CKD stage IV: Sees Dr. Lorrene Reid 14. Bicuspid aortic valve s/p 19 mm Edwards pericardial valve in 1/11 (had post-operative Dresslers syndrome).   15. CAD: CABG with SVG-RCA in 12/05.  Redo SVG-RCA in 1/11 with AVR.  LHC/RHC (9/14) with mean RA 18, PA 62/25 mean 43, mean PCWP 26, CI 2.8, 70-80% distal LM stenosis, 80% ostial LAD stenosis, total occlusion RCA, SVG-PDA patent.  Patient had DES LM into LAD.  Plan to continue long-term ASA/Brilinta (poor responder to Plavix).  ETT-cardiolite (4/15) with 6' exercise, EF 60%, ST depression in recovery and mild chest pain, no ischemia or infarction by perfusion images.  Unstable angina 11/15 with 80% LM, patent SVG-RCA => DES to ostial LM.   - Lexiscan Cardiolite (2/18): EF 62%, no ischemia or infarction (normal).  She had a significant reaction to [place] and probably should not get again.  - Cardiolite (9/18): EF 72%, prior inferior infarct with mild peri-infarct ischemia.  - LHC (9/18): 2 layers stent left main - ostial LAD with 50% dLM in-stent restenosis, 75+% ostial LCx, totally occluded RCA, SVG-RCA patent.  Medical management planned.  - ETT-Cardiolite (4/19): EF normal, 7'11" exercise, no ischemia or infarction.  - Lexiscan Cardiolite (8/20): No ischemia/infarction.  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  17. Anemia: Iron-deficiency.  Suspect  chronic GI blood loss.  EGD in 9/14 was unremarkable.  Capsule endoscopy 10/14 was unremarkable.  Has required periodic blood transfusions.  18. Atrial fibrillation: Paroxysmal, noted by [facility] interrogation.  19. Carotid stenosis: Carotid dopplers (2/15) with 40-59% bilateral stenosis. Carotid dopplers (3/16) with 40-59% bilateral ICA stenosis. Carotid dopplers (3/17) with 40-59% bilateral ICA stenosis.  - Carotid dopplers (3/18) with mild BICA stenosis.  20. 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  22. C difficile colitis 2017 23. Gout 24. ?TIA: head CT negative.  25. OSA: Moderate, uses CPAP.  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 Hypercalcemia 28. 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  Mild-moderate on 9/21 echo.  29. Tricuspid regurgitation: Moderate by 8/20 echo.  30. Lung nodules: Followed at [facility].   Current Outpatient Medications  Medication Sig Dispense Refill  . acyclovir ointment (ZOVIRAX) 5 % Apply 1 application topically every 3 (three) hours as needed (fever blister/cold sores).    . ALPRAZolam (XANAX) 0.5 MG tablet Take 1 tablet (0.5 mg total) by mouth 2 (two) times daily as needed for anxiety. 60 tablet 1  . APIDRA 100 UNIT/ML injection Inject 40 Units into the skin as directed. INFUSE THROUGH INSULIN PUMP UTD, Bolus depends on carb intake  2  . aspirin EC 81 MG tablet Take 81 mg by mouth at bedtime.    Marland Kitchen BRILINTA 60 MG TABS tablet TAKE ONE TABLET (60MG TOTAL) BY MOUTH TWICE DAILY 180 tablet 3  . clobetasol (OLUX) 0.05 % topical foam Apply 1 application topically 2 (two) times daily as needed (skin flaking).    . Coenzyme Q10 (CO Q 10) 100 MG CAPS Take 100 mg by mouth at bedtime.     . colchicine 0.6 MG tablet Take 0.6 mg by mouth daily as needed (gout flare).    . CONTOUR NEXT TEST test strip USE TO TEST BLOOD SUGAR UP TO EIGHT  TIMES A DAY    . diclofenac Sodium (VOLTAREN) 1 % GEL as needed.    . docusate sodium (COLACE) 100 MG capsule Take 100 mg by mouth 2 (two) times daily.    . Evolocumab (REPATHA SURECLICK) 939 MG/ML SOAJ Inject 140 mg into the skin every 14 (fourteen) days. 6 pen 3  . famotidine (PEPCID) 40 MG tablet Take 40 mg by mouth daily.    Marland Kitchen glucagon (GLUCAGON EMERGENCY) 1 MG injection Inject 1 mg into the vein once as needed (low blood sugar).     . isosorbide mononitrate (IMDUR) 120 MG 24 hr tablet TAKE ONE TABLET (120MG TOTAL) BY MOUTH DAILY 90 tablet 3  . latanoprost (XALATAN) 0.005 % ophthalmic solution Place 1 drop into both eyes at bedtime.    . metolazone (ZAROXOLYN) 2.5 MG tablet Take 2.5 mg (1 tab) once as directed by CHF clinic. Call before taking [phone] 3 tablet 0  . metoprolol tartrate (LOPRESSOR) 50 MG tablet Take 1 tablet (50 mg total) by mouth 2 (two) times daily. 60 tablet 3  . mometasone (NASONEX) 50 MCG/ACT nasal spray Place into the nose.    . mupirocin cream (BACTROBAN) 2 % Apply 1 application topically 2 (two) times daily as needed. 90 g 0  . nitroGLYCERIN (NITROLINGUAL) 0.4 MG/SPRAY spray PLACE 1 SPRAY UNDER THE TONGUE EVERY 5 MINUTES FOR 3 DOSES AS NEEDED FOR CHEST PAIN. 14.7 g 0  . pravastatin (PRAVACHOL) 10 MG tablet TAKE ONE TABLET (10 MG TOTAL) BY MOUTH DAILY. 90 tablet 3  . rOPINIRole (REQUIP) 0.25 MG tablet Take 1 tablet (0.25 mg total) by mouth daily. Take two hours prior to bedtime every night 90 tablet 3  . silver nitrate applicators 76-22 % applicator Use as needed to stop superficial skin bleeding. 10 each 0  . spironolactone (ALDACTONE) 25 MG tablet Take 12.5 mg by mouth daily.    Marland Kitchen torsemide (DEMADEX) 20 MG tablet Take 2 tablets (40 mg total) by mouth daily. 180 tablet 3  . ULORIC 40 MG tablet Take 1 tablet (40 mg total) by mouth at bedtime. 90 tablet 3  . UNABLE TO FIND Med Name: So  Clean CPAP cleaning system (Patient taking differently: Med Name: BiPap) 1 each 0   . VASCEPA 1 g capsule TAKE 2 CAPSULES BY MOUTH TWICE A DAY 360 capsule 3  . Wheat Dextrin (BENEFIBER) POWD Take 1 Dose by mouth daily.      No current facility-administered medications for this encounter.    Allergies:   Avandia [rosiglitazone maleate], Cephalexin, Clindamycin hcl, Humalog [insulin lispro], Lincomycin, Metformin, Novolog [insulin aspart], Penicillins, Rosiglitazone, Tizanidine, Adhesive [tape], Atorvastatin, Cholestyramine, Gentamycin [gentamicin], Iodinated diagnostic agents, Limonene, Pravastatin, Prednisone, Ranexa [ranolazine], Rosuvastatin, Simvastatin, Strawberry extract, Clopidogrel, Codeine, Erythromycin, Hydrocodone-acetaminophen, Latex, Neomycin, Nickel, Ranitidine, Tamiflu [oseltamivir], and Tramadol   Social History:  The patient  reports that she has never smoked. She has never used smokeless tobacco. She reports that she does not drink alcohol and does not use drugs.   Family History:  The patient's family history includes Breast cancer (age of onset: 30) in her sister; Cancer (age of onset: 52) in her sister; Cancer (age of onset: 80) in her maternal grandfather; Diabetes in her father; Glaucoma in her brother; Heart attack in her father, maternal grandmother, and mother; Heart disease in her father and mother; Hypertension in her brother; Lung cancer in her sister.   ROS:  Please see the history of present illness.   All other systems are personally reviewed and negative.   Exam:   BP 130/60   Pulse 77   SpO2 98%  General: NAD Neck: No JVD, no thyromegaly or thyroid nodule.  Lungs: Clear to auscultation bilaterally with normal respiratory effort. CV: Nondisplaced PMI.  Heart regular S1/S2, no S3/S4, no murmur.  No peripheral edema.  No carotid bruit.  Normal pedal pulses.  Abdomen: Soft, nontender, no hepatosplenomegaly, no distention.  Skin: Intact without lesions or rashes.  Neurologic: Alert and oriented x 3.  Psych: Normal affect. Extremities: No  clubbing or cyanosis.  HEENT: Normal.   Recent Labs: 04/25/2020: ALT 19; BUN 62; Creatinine, Ser 1.77; Hemoglobin 12.1; Platelets 124; Potassium 4.8; Sodium 136  Personally reviewed   Wt Readings from Last 3 Encounters:  07/06/20 53.4 kg (117 lb 12.8 oz)  07/05/20 51.3 kg (113 lb)  05/02/20 52.3 kg (115 lb 4.8 oz)    ASSESSMENT AND PLAN:  1. CAD: s/p SVG-RCA in 12/05, then redo SVG-RCA in 1/11-->distal left main/proximal LAD severe stenosis in 9/14. PCI with DES to distal LM/proximal LAD was done rather than bypass as she would have been a poor candidate for redo sternotomy (2 prior sternotomies and history of chest wall radiation for Hodgkins disease). Recurrent unstable angina with 80% ostial LM treated with repeat DES in 11/15.  Lexiscan Cardiolite in 9/18 inferior changes.  Repeat LHC (9/18) showed 50% distal left main in-stent restenosis and 75+% ostial LCx stenosis. I reviewed the films at the time with interventional cardiology => would be very difficult to intervene on the ostial LCx through 2 overlapping stents from left main into the LAD.  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 Given no ACS, we planned medical management and increased Imdur.  Cardiolite in 3/19 and again in 8/20 showed no ischemia or infarction.  No recent chest pain.  - Continue Brilinta 60 mg bid (poor responder to Plavix) long-term given recurrent left main disease.  - Continue ASA 81, statin/Praluent/Vascepa.  - Continue Imdur 120 mg daily . - Continue metoprolol 50 mg bid but may need to decrease/stop if she develops more advanced heart block (currently long 1st degree AVB)  and RV paces more (currently only 0.2% pacing).   - She was unable to tolerate ranolazine.  2. Bioprosthetic aortic valve replacement: In setting of bicuspid aortic valve, thoracic aorta not dilated on 2017 TEE. Valve looked ok on 9/21 echo.  3. Chronic diastolic CHF: Restrictive cardiomyopathy.  Most  recent echo in 9/21 shows normal LV systolic function, mildly dilated RV with mildly decreased systolic function, mild-moderate MS, normal bioprosthetic AoV, no evidence for pericardial constriction. RHC in 8/20 was concerning for right > left heart failure, she did appear to have equalization of diastolic pressures concerning for restrictive cardiomyopathy, possibly related to prior radiation. CPX in 11/20 showed severe HF limitation.  Balance of renal failure and RV failure has been difficult.  Creatinine 2 most recently.  She is not volume overloaded on exam today.  NYHA class II, stable.  Weight is stable.   - Continue torsemide 40 mg daily, will get BMET from earlier this week from her nephrologist. - Continue spironolactone 12.5 mg daily.  - I had her evaluated at [facility] for heart/kidney transplant for CKD and restrictive cardiomyopathy, but extensive vascular calcification makes her a poor surgical candidate.  - Would consider Jardiance use with CKD and diastolic CHF, but she has an insulin pump and may not be safe to use this medication.  Will have her discuss with her endocrinologist.  4. CKD: Patient has a single kidney s/p right nephrectomy and suspected diabetic nephropathy. CKD stage 4, slowly progressive.  Creatinine 2.0 last check. I have been concerned for cardiorenal syndrome in the setting of RV > LV failure/restrictive cardiomyopathy.   - Not candidate for PD given prior abdominal surgery.  - If CKD progresses, she may be a candidate for home HD.  5. Hyperlipidemia: She has not been able to tolerate any higher potency statin than pravastatin 10 mg daily.   - Continue pravastatin.  - Continue Repatha.   - She is on Vascepa for elevated TGs.  - Good LDL in 9/21. 6. Anemia: Chronic/slow GI bleed. Unremarkable EGD and capsule endoscopy in fall 2014. Unfortunately, needs to stay on Brilinta long-term (Plavix non-responder).  7. Atrial fibrillation: Paroxysmal, very brief episodes  noted on PCM interrogation. Given GI bleeding and need to be on Brilinta, risks have been thought to outweigh benefits of anticoagulation. She is not a good Multaq candidate with CHF. If she needs an antiarrhythmic, consider Tikosyn or Sotalol but renal function may make these meds difficult to use.  No recent palpitations, NSR today.  - We were unable to place Watchman device due to nickel allergy.  8. Carotid bruit: Only mild stenosis on last dopplers in 3/18.  9. SSS: Pacemaker in place.  10. HTN: controlled. Continue current regimen.  11. OSA: Moderate, using Bipap.   12. ?TIA: Transient dysarthria in 3/18, could have been afib-related TIA. No recurrent symptoms.  13. Thrombocytopenia: Chronic and mild. Follows with hematology.  14. 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  Based on 8/20 RHC and 9/21 echo it appears mild-moderate.  She would not be a candidate for valve replacement.  15. ?Syncope: She has infrequent syncopal/presyncopal episodes associated with "ears ringing."  Most recent on 1/22.  No high rate episodes on PPM interrogation on that day, and low rates would be prevented by PPM.  Possibly, she had a vagal event.   Followup in 3 months.   Signed, [name], MD  08/21/2020  [facility] [address] [facility] [place] [address] [phone] (office) [phone] (  fax)

## 2020-08-22 DIAGNOSIS — Z9641 Presence of insulin pump (external) (internal): Secondary | ICD-10-CM | POA: Diagnosis not present

## 2020-08-22 DIAGNOSIS — E1021 Type 1 diabetes mellitus with diabetic nephropathy: Secondary | ICD-10-CM | POA: Diagnosis not present

## 2020-08-22 NOTE — Telephone Encounter (Signed)
Pt's pharmacy is requesting a refill on ropinirole. Would Dr. Radford Pax like to refill this medication? Please address

## 2020-08-23 ENCOUNTER — Encounter (HOSPITAL_COMMUNITY): Payer: Self-pay

## 2020-08-23 DIAGNOSIS — N2889 Other specified disorders of kidney and ureter: Secondary | ICD-10-CM | POA: Diagnosis not present

## 2020-08-23 DIAGNOSIS — N281 Cyst of kidney, acquired: Secondary | ICD-10-CM | POA: Diagnosis not present

## 2020-08-26 ENCOUNTER — Telehealth (HOSPITAL_COMMUNITY): Payer: Self-pay | Admitting: Cardiology

## 2020-08-26 NOTE — Telephone Encounter (Signed)
Abnormal labs received Labs drawn 07/26/20 with Dr Gerald Dexter at [facility] BUN 72 Cr 2.0   Pt reports labs were repeated at [place] Dr Hollie Salk Cr 1.4  Pt does not wish to repeat at this time as she is traveling to [place]

## 2020-08-31 ENCOUNTER — Other Ambulatory Visit (HOSPITAL_COMMUNITY): Payer: Self-pay | Admitting: Cardiology

## 2020-09-13 DIAGNOSIS — C50912 Malignant neoplasm of unspecified site of left female breast: Secondary | ICD-10-CM | POA: Diagnosis not present

## 2020-09-17 IMAGING — CT CT TIBIA FIBULA *R* W/O CM
2 series · 7 of 27 positions shown, 8 images · non-contrast
Comparison: Radiographs dated 06/09/2019

CLINICAL DATA: Right lower leg pain secondary to blunt trauma on
05/25/2019. Swelling. The patient is unable to bear weight.

EXAM:
CT OF THE LOWER RIGHT EXTREMITY WITHOUT CONTRAST
TECHNIQUE: Multidetector CT imaging of the right lower extremity was performed
according to the standard protocol.

[Series 4: soft tissue lower extremity · axial · 0.38mm/px · z∈[-1061,-823]mm · 2 of 259 slices shown, 3 images]
[im 80/259  soft-tissue]
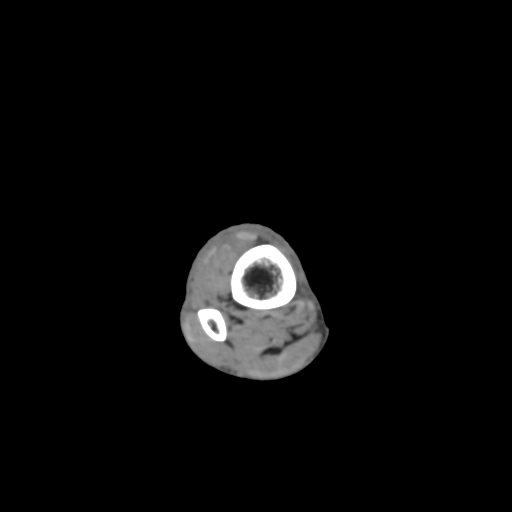
[im 80/259  bone]
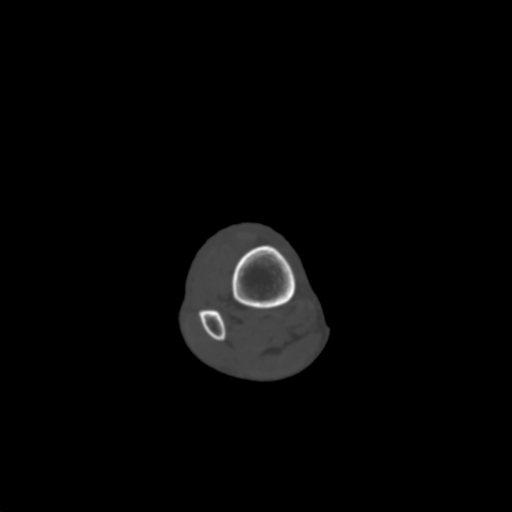
[im 199/259  bone]
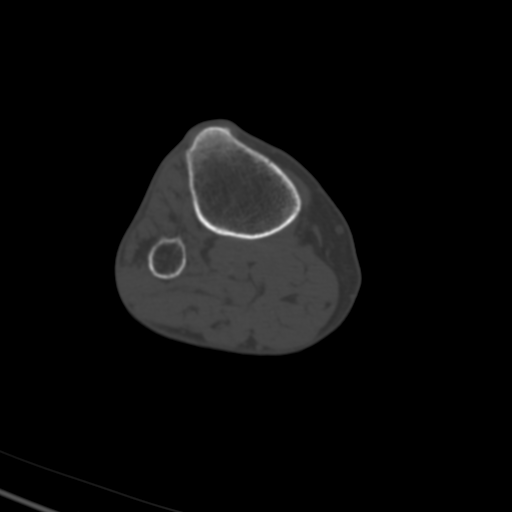

[Series 10: sagsoft tissue · sagittal · 0.29mm/px · 5 of 67 slices shown]
[im 23/67  bone]
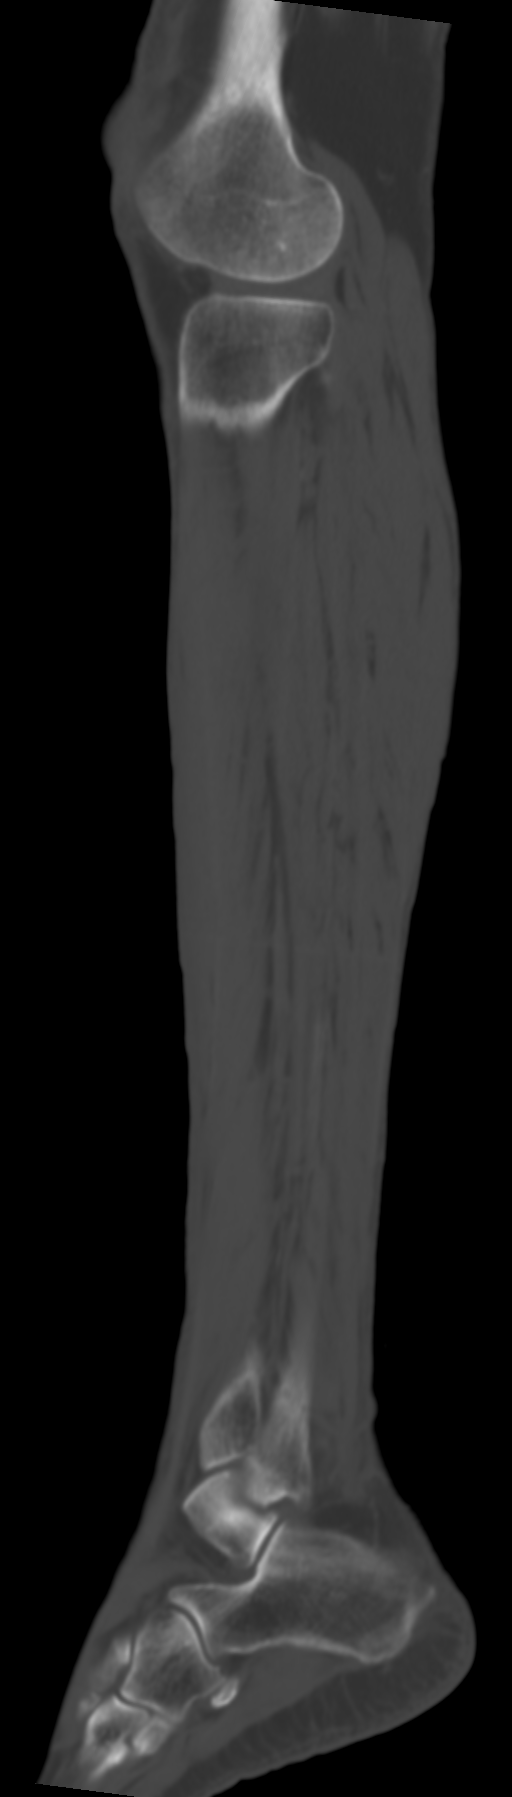
[im 28/67  bone]
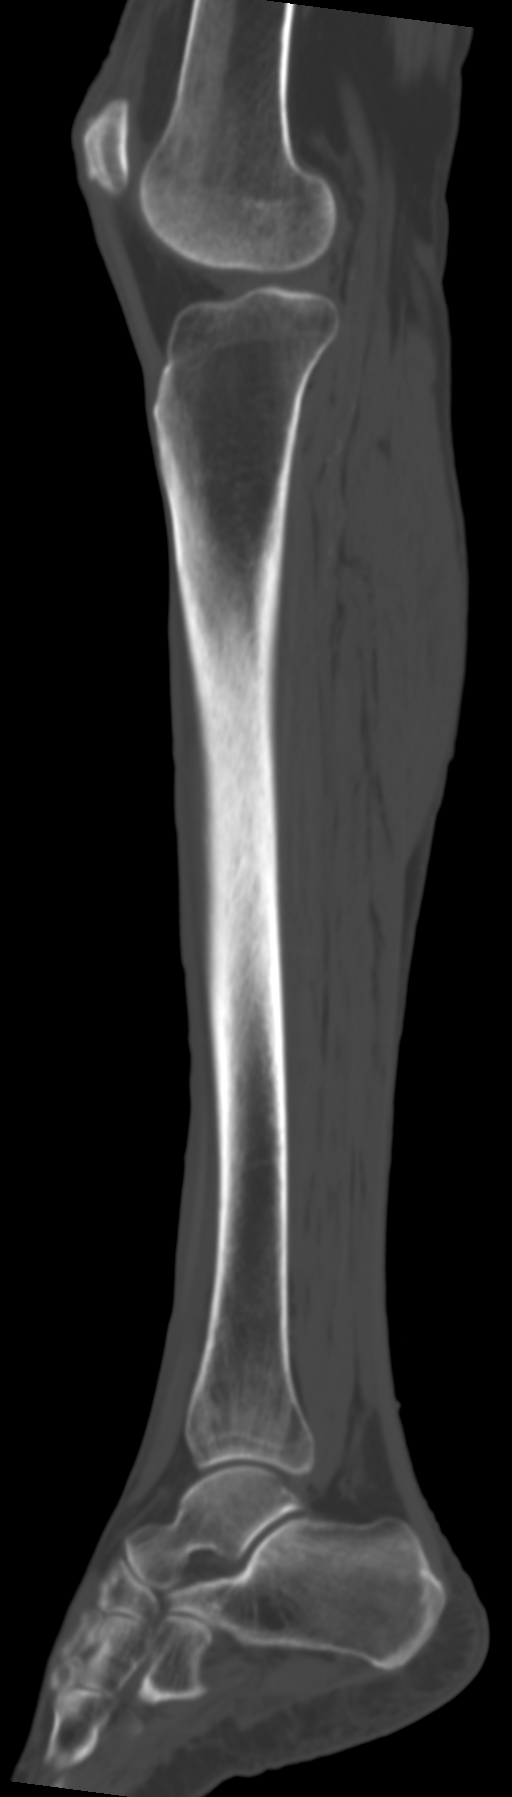
[im 34/67  bone]
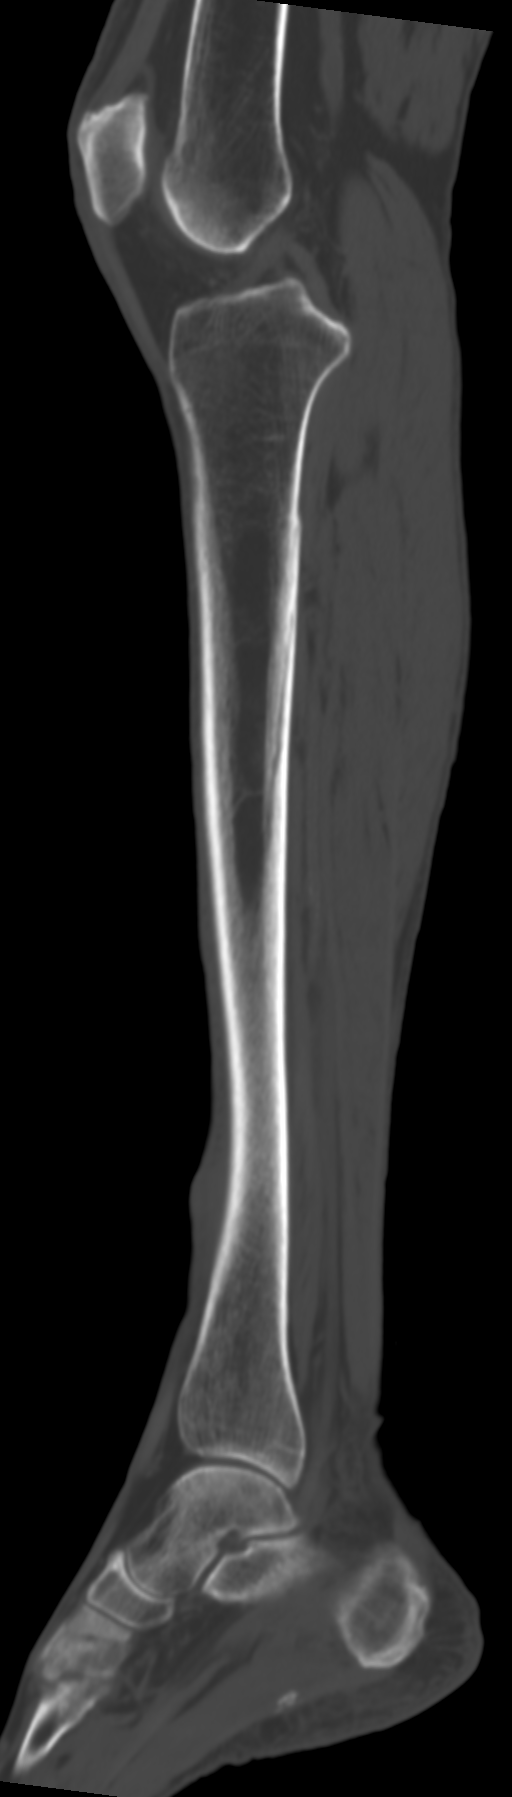
[im 39/67  bone]
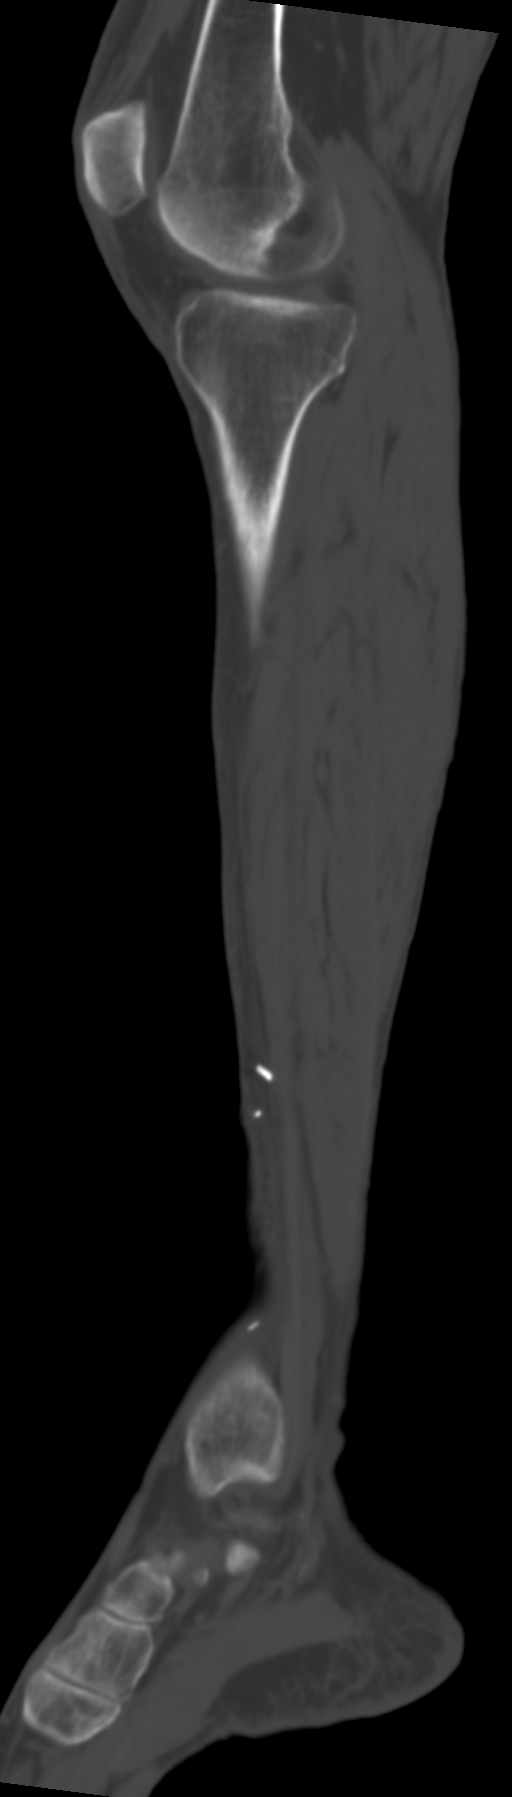
[im 45/67  bone]
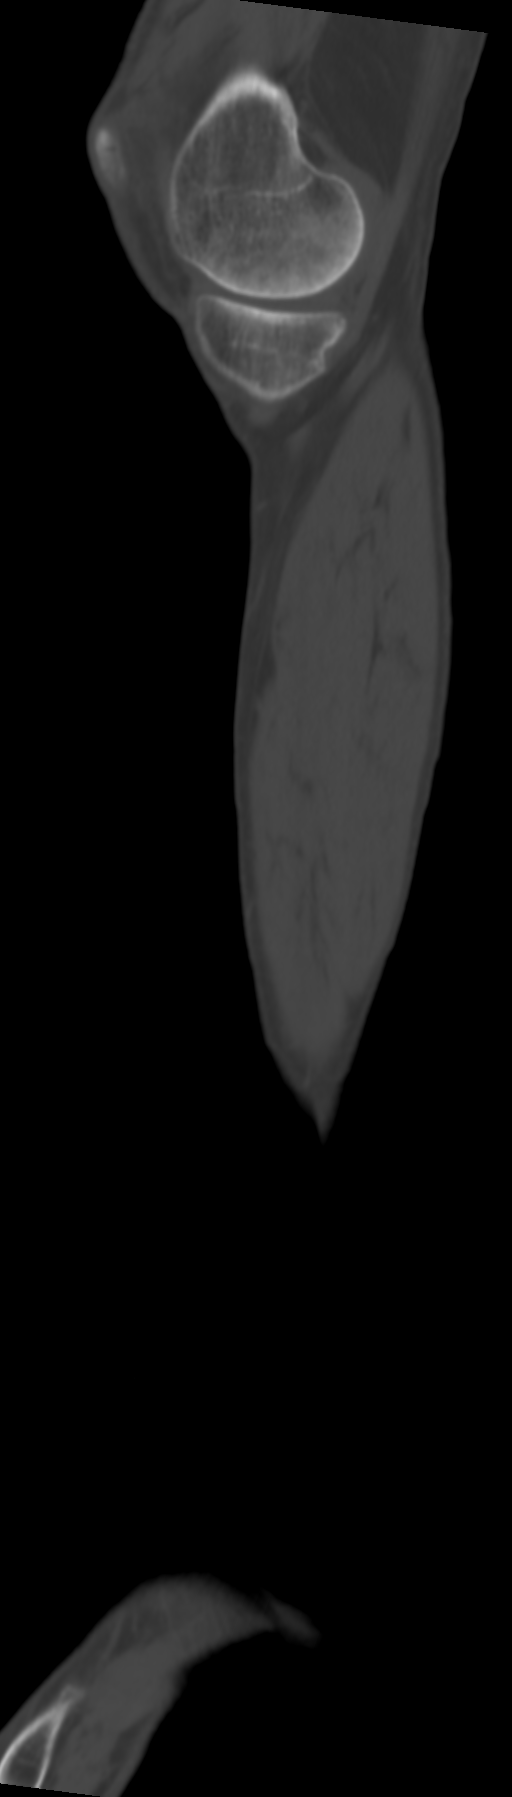

[7 of 27 positions shown; findings below may reference images not displayed]

FINDINGS: Bones/Joint/Cartilage

There is no fracture or bone lesion. No stress reaction. No
arthritic changes of the ankle or knee.

Ligaments

Suboptimally assessed by CT. Cruciate and collateral ligaments at
the knee are normal. Ankle ligaments appear to be intact.

Muscles and Tendons

The muscles and tendons of the lower leg appear normal.

Soft tissues

There is a small residual subcutaneous hematoma adjacent to the
anteromedial aspect of the distal tibia centered 8 cm above the
ankle joint. There is slight edema in the subcutaneous soft tissues
at the anterior aspect of the distal lower leg. No other
abnormality.
IMPRESSION: 1. Small residual subcutaneous hematoma adjacent to the anteromedial
aspect of the distal tibia centered 8 cm above the ankle joint.
2. Slight edema in the subcutaneous soft tissues of the anterior
aspect of the distal lower leg.
3. Otherwise normal exam.

## 2020-10-03 DIAGNOSIS — H401131 Primary open-angle glaucoma, bilateral, mild stage: Secondary | ICD-10-CM | POA: Diagnosis not present

## 2020-10-03 DIAGNOSIS — E109 Type 1 diabetes mellitus without complications: Secondary | ICD-10-CM | POA: Diagnosis not present

## 2020-10-04 ENCOUNTER — Ambulatory Visit (INDEPENDENT_AMBULATORY_CARE_PROVIDER_SITE_OTHER): Payer: Medicare PPO

## 2020-10-04 ENCOUNTER — Telehealth: Payer: Self-pay | Admitting: Internal Medicine

## 2020-10-04 DIAGNOSIS — I495 Sick sinus syndrome: Secondary | ICD-10-CM | POA: Diagnosis not present

## 2020-10-04 DIAGNOSIS — E1021 Type 1 diabetes mellitus with diabetic nephropathy: Secondary | ICD-10-CM | POA: Diagnosis not present

## 2020-10-04 NOTE — Telephone Encounter (Signed)
Barry(husband) on DPR informed that 10/04/20 appointment is for a remote transmission and that she does need to send it today.

## 2020-10-04 NOTE — Telephone Encounter (Signed)
Patient called requesting to speak with someone in the device clinic regarding an appointment she has this afternoon with remote call in.

## 2020-10-05 LAB — CUP PACEART REMOTE DEVICE CHECK
Battery Impedance: 632 Ohm
Battery Remaining Longevity: 90 mo
Battery Voltage: 2.79 V
Brady Statistic AP VP Percent: 0 %
Brady Statistic AP VS Percent: 0 %
Brady Statistic AS VP Percent: 0 %
Brady Statistic AS VS Percent: 100 %
Date Time Interrogation Session: 20220315113650
Implantable Lead Implant Date: 20140130
Implantable Lead Implant Date: 20140130
Implantable Lead Location: 753859
Implantable Lead Location: 753860
Implantable Lead Model: 5076
Implantable Lead Model: 5076
Implantable Pulse Generator Implant Date: 20140130
Lead Channel Impedance Value: 447 Ohm
Lead Channel Impedance Value: 592 Ohm
Lead Channel Pacing Threshold Amplitude: 0.5 V
Lead Channel Pacing Threshold Amplitude: 0.75 V
Lead Channel Pacing Threshold Pulse Width: 0.4 ms
Lead Channel Pacing Threshold Pulse Width: 0.4 ms
Lead Channel Setting Pacing Amplitude: 1.5 V
Lead Channel Setting Pacing Amplitude: 2.5 V
Lead Channel Setting Pacing Pulse Width: 0.4 ms
Lead Channel Setting Sensing Sensitivity: 5.6 mV

## 2020-10-10 DIAGNOSIS — G2581 Restless legs syndrome: Secondary | ICD-10-CM | POA: Diagnosis not present

## 2020-10-10 DIAGNOSIS — F419 Anxiety disorder, unspecified: Secondary | ICD-10-CM | POA: Diagnosis not present

## 2020-10-10 DIAGNOSIS — N185 Chronic kidney disease, stage 5: Secondary | ICD-10-CM | POA: Diagnosis not present

## 2020-10-10 DIAGNOSIS — E1022 Type 1 diabetes mellitus with diabetic chronic kidney disease: Secondary | ICD-10-CM | POA: Diagnosis not present

## 2020-10-10 DIAGNOSIS — Z0000 Encounter for general adult medical examination without abnormal findings: Secondary | ICD-10-CM | POA: Diagnosis not present

## 2020-10-11 DIAGNOSIS — H43812 Vitreous degeneration, left eye: Secondary | ICD-10-CM | POA: Diagnosis not present

## 2020-10-13 DIAGNOSIS — E1021 Type 1 diabetes mellitus with diabetic nephropathy: Secondary | ICD-10-CM | POA: Diagnosis not present

## 2020-10-13 DIAGNOSIS — Z9641 Presence of insulin pump (external) (internal): Secondary | ICD-10-CM | POA: Diagnosis not present

## 2020-10-13 DIAGNOSIS — E1043 Type 1 diabetes mellitus with diabetic autonomic (poly)neuropathy: Secondary | ICD-10-CM | POA: Diagnosis not present

## 2020-10-13 DIAGNOSIS — I130 Hypertensive heart and chronic kidney disease with heart failure and stage 1 through stage 4 chronic kidney disease, or unspecified chronic kidney disease: Secondary | ICD-10-CM | POA: Diagnosis not present

## 2020-10-13 DIAGNOSIS — E10319 Type 1 diabetes mellitus with unspecified diabetic retinopathy without macular edema: Secondary | ICD-10-CM | POA: Diagnosis not present

## 2020-10-13 DIAGNOSIS — K3184 Gastroparesis: Secondary | ICD-10-CM | POA: Diagnosis not present

## 2020-10-13 DIAGNOSIS — N1832 Chronic kidney disease, stage 3b: Secondary | ICD-10-CM | POA: Diagnosis not present

## 2020-10-13 DIAGNOSIS — E1022 Type 1 diabetes mellitus with diabetic chronic kidney disease: Secondary | ICD-10-CM | POA: Diagnosis not present

## 2020-10-13 DIAGNOSIS — E041 Nontoxic single thyroid nodule: Secondary | ICD-10-CM | POA: Diagnosis not present

## 2020-10-13 DIAGNOSIS — I509 Heart failure, unspecified: Secondary | ICD-10-CM | POA: Diagnosis not present

## 2020-10-13 NOTE — Progress Notes (Signed)
Remote pacemaker transmission.   

## 2020-10-31 ENCOUNTER — Inpatient Hospital Stay (HOSPITAL_COMMUNITY): Payer: Medicare PPO | Attending: Hematology

## 2020-10-31 ENCOUNTER — Other Ambulatory Visit: Payer: Self-pay

## 2020-10-31 DIAGNOSIS — I130 Hypertensive heart and chronic kidney disease with heart failure and stage 1 through stage 4 chronic kidney disease, or unspecified chronic kidney disease: Secondary | ICD-10-CM | POA: Diagnosis not present

## 2020-10-31 DIAGNOSIS — I5032 Chronic diastolic (congestive) heart failure: Secondary | ICD-10-CM | POA: Diagnosis not present

## 2020-10-31 DIAGNOSIS — Z79899 Other long term (current) drug therapy: Secondary | ICD-10-CM | POA: Diagnosis not present

## 2020-10-31 DIAGNOSIS — E785 Hyperlipidemia, unspecified: Secondary | ICD-10-CM | POA: Insufficient documentation

## 2020-10-31 DIAGNOSIS — Z951 Presence of aortocoronary bypass graft: Secondary | ICD-10-CM | POA: Insufficient documentation

## 2020-10-31 DIAGNOSIS — Z7982 Long term (current) use of aspirin: Secondary | ICD-10-CM | POA: Diagnosis not present

## 2020-10-31 DIAGNOSIS — R5383 Other fatigue: Secondary | ICD-10-CM | POA: Diagnosis not present

## 2020-10-31 DIAGNOSIS — M810 Age-related osteoporosis without current pathological fracture: Secondary | ICD-10-CM | POA: Diagnosis not present

## 2020-10-31 DIAGNOSIS — D696 Thrombocytopenia, unspecified: Secondary | ICD-10-CM | POA: Insufficient documentation

## 2020-10-31 DIAGNOSIS — E1122 Type 2 diabetes mellitus with diabetic chronic kidney disease: Secondary | ICD-10-CM | POA: Diagnosis not present

## 2020-10-31 DIAGNOSIS — N182 Chronic kidney disease, stage 2 (mild): Secondary | ICD-10-CM | POA: Insufficient documentation

## 2020-10-31 DIAGNOSIS — I2510 Atherosclerotic heart disease of native coronary artery without angina pectoris: Secondary | ICD-10-CM | POA: Diagnosis not present

## 2020-10-31 DIAGNOSIS — Z85528 Personal history of other malignant neoplasm of kidney: Secondary | ICD-10-CM | POA: Insufficient documentation

## 2020-10-31 LAB — COMPREHENSIVE METABOLIC PANEL
ALT: 18 U/L (ref 0–44)
AST: 23 U/L (ref 15–41)
Albumin: 4.4 g/dL (ref 3.5–5.0)
Alkaline Phosphatase: 97 U/L (ref 38–126)
Anion gap: 13 (ref 5–15)
BUN: 72 mg/dL — ABNORMAL HIGH (ref 8–23)
CO2: 25 mmol/L (ref 22–32)
Calcium: 10.2 mg/dL (ref 8.9–10.3)
Chloride: 100 mmol/L (ref 98–111)
Creatinine, Ser: 2 mg/dL — ABNORMAL HIGH (ref 0.44–1.00)
GFR, Estimated: 27 mL/min — ABNORMAL LOW (ref >60)
Glucose, Bld: 244 mg/dL — ABNORMAL HIGH (ref 70–99)
Potassium: 4.7 mmol/L (ref 3.5–5.1)
Sodium: 138 mmol/L (ref 135–145)
Total Bilirubin: 0.6 mg/dL (ref 0.3–1.2)
Total Protein: 8.6 g/dL — ABNORMAL HIGH (ref 6.5–8.1)

## 2020-10-31 LAB — CBC WITH DIFFERENTIAL/PLATELET
Abs Immature Granulocytes: 0.06 10*3/uL (ref 0.00–0.07)
Basophils Absolute: 0.1 10*3/uL (ref 0.0–0.1)
Basophils Relative: 1 %
Eosinophils Absolute: 0.1 10*3/uL (ref 0.0–0.5)
Eosinophils Relative: 2 %
HCT: 38.3 % (ref 36.0–46.0)
Hemoglobin: 12.3 g/dL (ref 12.0–15.0)
Immature Granulocytes: 1 %
Lymphocytes Relative: 15 %
Lymphs Abs: 1.2 10*3/uL (ref 0.7–4.0)
MCH: 29.9 pg (ref 26.0–34.0)
MCHC: 32.1 g/dL (ref 30.0–36.0)
MCV: 93.2 fL (ref 80.0–100.0)
Monocytes Absolute: 0.6 10*3/uL (ref 0.1–1.0)
Monocytes Relative: 7 %
Neutro Abs: 6.4 10*3/uL (ref 1.7–7.7)
Neutrophils Relative %: 74 %
Platelets: 115 10*3/uL — ABNORMAL LOW (ref 150–400)
RBC: 4.11 MIL/uL (ref 3.87–5.11)
RDW: 13.6 % (ref 11.5–15.5)
WBC: 8.5 10*3/uL (ref 4.0–10.5)
nRBC: 0 % (ref 0.0–0.2)

## 2020-11-01 DIAGNOSIS — H43812 Vitreous degeneration, left eye: Secondary | ICD-10-CM | POA: Diagnosis not present

## 2020-11-01 LAB — KAPPA/LAMBDA LIGHT CHAINS
Kappa free light chain: 75.7 mg/L — ABNORMAL HIGH (ref 3.3–19.4)
Kappa, lambda light chain ratio: 1.82 — ABNORMAL HIGH (ref 0.26–1.65)
Lambda free light chains: 41.7 mg/L — ABNORMAL HIGH (ref 5.7–26.3)

## 2020-11-07 ENCOUNTER — Inpatient Hospital Stay (HOSPITAL_BASED_OUTPATIENT_CLINIC_OR_DEPARTMENT_OTHER): Payer: Medicare PPO | Admitting: Hematology

## 2020-11-07 ENCOUNTER — Other Ambulatory Visit: Payer: Self-pay

## 2020-11-07 VITALS — BP 118/61 | HR 79 | Temp 96.9°F | Resp 18 | Wt 119.0 lb

## 2020-11-07 DIAGNOSIS — N182 Chronic kidney disease, stage 2 (mild): Secondary | ICD-10-CM | POA: Diagnosis not present

## 2020-11-07 DIAGNOSIS — D696 Thrombocytopenia, unspecified: Secondary | ICD-10-CM

## 2020-11-07 DIAGNOSIS — E785 Hyperlipidemia, unspecified: Secondary | ICD-10-CM | POA: Diagnosis not present

## 2020-11-07 DIAGNOSIS — E1122 Type 2 diabetes mellitus with diabetic chronic kidney disease: Secondary | ICD-10-CM | POA: Diagnosis not present

## 2020-11-07 DIAGNOSIS — R5383 Other fatigue: Secondary | ICD-10-CM | POA: Diagnosis not present

## 2020-11-07 DIAGNOSIS — I5032 Chronic diastolic (congestive) heart failure: Secondary | ICD-10-CM | POA: Diagnosis not present

## 2020-11-07 DIAGNOSIS — I130 Hypertensive heart and chronic kidney disease with heart failure and stage 1 through stage 4 chronic kidney disease, or unspecified chronic kidney disease: Secondary | ICD-10-CM | POA: Diagnosis not present

## 2020-11-07 DIAGNOSIS — I2510 Atherosclerotic heart disease of native coronary artery without angina pectoris: Secondary | ICD-10-CM | POA: Diagnosis not present

## 2020-11-07 NOTE — Progress Notes (Signed)
[name] [name], [place] [address]   CLINIC:  Medical Oncology/Hematology  PCP:  [name] [name], [place] / [place] [address]  [phone]  REASON FOR VISIT:  Follow-up for thrombocytopenia  PRIOR THERAPY: None  CURRENT THERAPY: Observation  INTERVAL HISTORY:  [name], a 70 y.o. female, returns for routine follow-up for her thrombocytopenia. [name] [name] was last seen on 05/02/2020.  Today she reports feeling okay. On some days her energy levels are better than others. She denies having easy bleeding, nosebleeds, hematochezia or hematuria, though she reports having easy bruising after injections and light bumps on her anterior thighs. She takes Brilinta and ASA 81. She is limited to drinking 2 liters of water daily due to her CHF.   REVIEW OF SYSTEMS:  Review of Systems  Constitutional: Positive for fatigue (50%). Negative for appetite change.  HENT:   Negative for nosebleeds.   Gastrointestinal: Negative for blood in stool.  Genitourinary: Negative for hematuria.   Skin: Positive for itching.  Hematological: Bruises/bleeds easily (easy bruising).  All other systems reviewed and are negative.   PAST MEDICAL/SURGICAL HISTORY:  Past Medical History:  Diagnosis Date  . Anxiety   . Aortic stenosis    a. bicuspid aortic valve; mean gradient of 20 mmHg in 2/10; b. s/p bioprosth AVR (19-mm Edwards pericardial valve) with a redo coronary artery bypass graft procedure in 07/2009; postoperative Dressler's syndrome    . Arthritis   . Breast cancer ([facility])   . Carcinoma, renal cell ([facility]) 05/2007   Laparoscopic right nephrectomy  . 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 Chronic diastolic CHF (congestive heart failure) ([facility])    a. 9.2014 EchoP EF 60-65%, no rwma, bioprosth AVR, mean gradient of 38mHg, mild to mod MR, PASP 575mg.  . CKD (chronic kidney disease), stage II   . Colitis, ischemic ([place]  . Coronary  artery disease    a. initial CABG with SVG-RCA in 12/05. b. redo SVG-RCA and bioprosthetic AVR in 1/11. d. 9/14 - PCI with DES to distal LM/proximal LAD was done rather than bypass as she would have been a poor candidate for redo sternotomy. d. S/p DES to prox LM 05/2014.  . Diabetes mellitus 03/2010    TYPE II: Hemoglobin A1c of 7.4 in  03/2010; 8.4 in 06/2010; treated with insulin pump  . Duodenal ulcer    Remote; H. pylori positive  . Gastroparesis due to DM ([facility]10/04/2012  . Genetic testing 01/14/2017   Ms. Pensyl underwent genetic counseling and testing for hereditary cancer syndromes on 01/01/2017. Her results were negative for mutations in all 83 genes analyzed by [facility]'s 83-gene Common Hereditary Cancers Panel. Genes analyzed include: ALK, APC, ATM, AXIN2, BAP1, BARD1, BLM, BMPR1A, BRCA1, BRCA2, BRIP1, CASR, CDC73, CDH1, CDK4, CDKN1B, CDKN1C, CDKN2A, CEBPA, CHEK2, CTNNA1, DICER1, DIS3L2,   . Hodgkin's disease ([place]1991   Mantle radiation therapy  . Hodgkin's disease ([facility] [place]  . Hyperlipidemia   . Hyperplastic gastric polyp 06/23/2012  . Hypertension   . Iron deficiency anemia    a. 03/2013 EGD: essentially normal. Possible slow GIB.  . Migraines   . NASH (nonalcoholic steatohepatitis) 1999   -biopsy in 1999  . OSA on CPAP 04/24/2017   Moderate with AHI 22/hr now on CPAP at 18cm H2o  . Osteopenia     hip on DEXA in October 2007.  . Osteoporosis   . Oxygen deficiency   . Pacemaker  six sinus syndrome  . PAF (paroxysmal atrial fibrillation) ([facility])    a.  h/o brief episodes noted on ppm interrogation. b. given GI bleeding and need to be on both ASA 81 and Brilinta, risks likely outweigh benefits of anticoagulation.   . Renal insufficiency   . Small bowel obstruction (HCC)    Recurrent; resolved after resection of a lipoma  . Syncope    a. H/o recurrent syncope with pauses on loop recorder s/p Medtronic pacemaker 07/2012.   Past Surgical History:  Procedure Laterality Date  .  ABDOMINAL HYSTERECTOMY    . AORTIC VALVE REPLACEMENT  2011   Aortic valve replacement surgery with a 19 mm bioprosthetic device post redo CABG January 2011.  Marland Kitchen BONE MARROW BIOPSY    . BREAST EXCISIONAL BIOPSY     benign, bilateral  . CARDIAC CATHETERIZATION    . CERVICAL BIOPSY    . COLONOSCOPY     Multiple with adenomatous polyps  . COLONOSCOPY  06/23/2012   Procedure: COLONOSCOPY;  Surgeon: Gatha Mayer, MD;  Location: [facility];  Service: Endoscopy;  Laterality: N/A;  . CORONARY ARTERY BYPASS GRAFT  2005, 2011   CABG-most recent SVG to RCA-12/05; RCA occluded with patent graft in 2006; Redo bypass in 07/2009  . ESOPHAGOGASTRODUODENOSCOPY    . ESOPHAGOGASTRODUODENOSCOPY  06/23/2012   Procedure: ESOPHAGOGASTRODUODENOSCOPY (EGD);  Surgeon: Gatha Mayer, MD;  Location: [facility];  Service: Endoscopy;  Laterality: N/A;  . ESOPHAGOGASTRODUODENOSCOPY N/A 04/06/2013   Procedure: ESOPHAGOGASTRODUODENOSCOPY (EGD);  Surgeon: Irene Shipper, MD;  Location[facility] ENDOSCOPY;  Service: Endoscopy;  Laterality: N/A;  . EXPLORATORY LAPAROTOMY W/ BOWEL RESECTION     Small bowel resection of lipoma  . GIVENS CAPSULE STUDY N/A 04/30/2013   Procedure: GIVENS CAPSULE STUDY;  Surgeon: Gatha Mayer, MD;  Location: [facility];  Service: Endoscopy;  Laterality: N/A;  . LEFT AND RIGHT HEART CATHETERIZATION WITH CORONARY ANGIOGRAM N/A 04/07/2013   Procedure: LEFT AND RIGHT HEART CATHETERIZATION WITH CORONARY ANGIOGRAM;  Surgeon: Larey Dresser, MD;  Location: [facility] CATH LAB;  Service: Cardiovascular;  Laterality: N/A;  . LEFT ATRIAL APPENDAGE OCCLUSION N/A 02/16/2016   Watchman not placed - nickel allergy  . LEFT HEART CATHETERIZATION WITH CORONARY/GRAFT ANGIOGRAM N/A 06/07/2014   Procedure: LEFT HEART CATHETERIZATION WITH Beatrix Fetters;  Surgeon: Burnell Blanks, MD;  Location: [facility] CATH LAB;  Service: Cardiovascular;  Laterality: N/A;  . LIVER BIOPSY    . LOOP RECORDER IMPLANT N/A 12/10/2011    Procedure: LOOP RECORDER IMPLANT;  Surgeon: Evans Lance, MD;  Location: [facility] CATH LAB;  Service: Cardiovascular;  Laterality: N/A;  . MALONEY DILATION  06/23/2012   Procedure: [name] DILATION;  Surgeon: Gatha Mayer, MD;  Location: [facility] ENDOSCOPY;  Service: Endoscopy;;  54 fr  . MASTECTOMY Left 05/17/2015   total   . NEPHRECTOMY  2008   Laparoscopic right nephrectomy, renal cell carcinoma  . PACEMAKER INSERTION  08/21/2012   Medtronic Adapta L dual-chamber pacemaker  . PERCUTANEOUS CORONARY STENT INTERVENTION (PCI-S) N/A 04/09/2013   Procedure: PERCUTANEOUS CORONARY STENT INTERVENTION (PCI-S);  Surgeon: Burnell Blanks, MD;  Location: [facility] CATH LAB;  Service: Cardiovascular;  Laterality: N/A;  . PERMANENT PACEMAKER INSERTION N/A 08/21/2012   Procedure: PERMANENT PACEMAKER INSERTION;  Surgeon: Evans Lance, MD;  Location: [facility] CATH LAB;  Service: Cardiovascular;  Laterality: N/A;  . RIGHT HEART CATH N/A 03/18/2019   Procedure: RIGHT HEART CATH;  Surgeon: Larey Dresser, MD;  Location: [facility] CV LAB;  Service:  Cardiovascular;  Laterality: N/A;  . RIGHT HEART CATH AND CORONARY/GRAFT ANGIOGRAPHY N/A 04/19/2017   Procedure: RIGHT HEART CATH AND CORONARY/GRAFT ANGIOGRAPHY;  Surgeon: Larey Dresser, MD;  Location: [facility] CV LAB;  Service: Cardiovascular;  Laterality: N/A;  . TEE WITHOUT CARDIOVERSION N/A 12/07/2015   Procedure: TRANSESOPHAGEAL ECHOCARDIOGRAM (TEE);  Surgeon: Larey Dresser, MD;  Location: [facility];  Service: Cardiovascular;  Laterality: N/A;  . TOTAL ABDOMINAL HYSTERECTOMY W/ BILATERAL SALPINGOOPHORECTOMY  2000   endometriosis and ovarian cysts  . TOTAL MASTECTOMY Left 05/17/2015   Procedure: LEFT TOTAL MASTECTOMY;  Surgeon: Rolm Bookbinder, MD;  Location: [facility];  Service: General;  Laterality: Left;  . TUMOR EXCISION  1981   removal of Hodgkins lymphoma    SOCIAL HISTORY:  Social History   Socioeconomic History  . Marital status: Married    Spouse name:  Not on file  . Number of children: 2  . Years of education: Not on file  . Highest education level: Not on file  Occupational History  . Occupation: retired    Comment: elementary principal  Tobacco Use  . Smoking status: Never Smoker  . Smokeless tobacco: Never Used  Vaping Use  . Vaping Use: Never used  Substance and Sexual Activity  . Alcohol use: No    Alcohol/week: 0.0 standard drinks  . Drug use: No  . Sexual activity: Yes  Other Topics Concern  . Not on file  Social History Narrative   Retired [employer] principal, married    Started disability September 2013      Has one daughter in [place], works at [employer]. Has a son, Aaron Edelman, who is Retail buyer.   Eats all food groups.   Wear seatbelt.    Attends church. [place], [place]. [place].    Social Determinants of Health   Financial Resource Strain: Not on file  Food Insecurity: Not on file  Transportation Needs: Not on file  Physical Activity: Not on file  Stress: Not on file  Social Connections: Not on file  Intimate Partner Violence: Not on file    FAMILY HISTORY:  Family History  Problem Relation Age of Onset  . Heart attack Mother        d.61  . Heart disease Mother   . Heart attack Father        d.60  . Diabetes Father   . Heart disease Father   . Lung cancer Sister        d.61 metastases to brain. History of smoking. Maternal half-sister.  . Cancer Sister 30       unspecified gynecologic cancer (either cervical or ovarian) in her 52s.  Marland Kitchen Heart attack Maternal Grandmother   . Cancer Maternal Grandfather 52       d.52s oral cancer  . Hypertension Brother   . Glaucoma Brother   . Breast cancer Sister 40       d.42 from metastatic disease. Paternal half-sister.  . Colon cancer Neg Hx   . Stomach cancer Neg Hx     CURRENT MEDICATIONS:  Current Outpatient Medications  Medication Sig Dispense Refill  . acyclovir ointment (ZOVIRAX) 5 % Apply 1 application topically every 3 (three) hours as  needed (fever blister/cold sores).    . ALPRAZolam (XANAX) 0.5 MG tablet Take 1 tablet (0.5 mg total) by mouth 2 (two) times daily as needed for anxiety. 60 tablet 1  . APIDRA 100 UNIT/ML injection Inject 40 Units into the skin as directed. INFUSE THROUGH INSULIN PUMP UTD,  Bolus depends on carb intake  2  . aspirin EC 81 MG tablet Take 81 mg by mouth at bedtime.    Marland Kitchen BRILINTA 60 MG TABS tablet TAKE ONE TABLET (60MG TOTAL) BY MOUTH TWICE DAILY 180 tablet 3  . clobetasol (OLUX) 0.05 % topical foam Apply 1 application topically 2 (two) times daily as needed (skin flaking).    . Coenzyme Q10 (CO Q 10) 100 MG CAPS Take 100 mg by mouth at bedtime.     . colchicine 0.6 MG tablet Take 0.6 mg by mouth daily as needed (gout flare).    . CONTOUR NEXT TEST test strip USE TO TEST BLOOD SUGAR UP TO EIGHT TIMES A DAY    . diclofenac Sodium (VOLTAREN) 1 % GEL as needed.    . docusate sodium (COLACE) 100 MG capsule Take 100 mg by mouth 2 (two) times daily.    . Evolocumab (REPATHA SURECLICK) 829 MG/ML SOAJ Inject 140 mg into the skin every 14 (fourteen) days. 6 pen 3  . famotidine (PEPCID) 40 MG tablet Take 40 mg by mouth daily.    Marland Kitchen glucagon (GLUCAGON EMERGENCY) 1 MG injection Inject 1 mg into the vein once as needed (low blood sugar).     . isosorbide mononitrate (IMDUR) 120 MG 24 hr tablet TAKE ONE TABLET (120MG TOTAL) BY MOUTH DAILY 90 tablet 3  . latanoprost (XALATAN) 0.005 % ophthalmic solution Place 1 drop into both eyes at bedtime.    . metolazone (ZAROXOLYN) 2.5 MG tablet Take 2.5 mg (1 tab) once as directed by CHF clinic. Call before taking [phone] 3 tablet 0  . metoprolol tartrate (LOPRESSOR) 50 MG tablet Take 1 tablet (50 mg total) by mouth 2 (two) times daily. 60 tablet 3  . mometasone (NASONEX) 50 MCG/ACT nasal spray Place into the nose.    . mupirocin cream (BACTROBAN) 2 % Apply 1 application topically 2 (two) times daily as needed. 90 g 0  . nitroGLYCERIN (NITROLINGUAL) 0.4 MG/SPRAY spray  PLACE 1 SPRAY UNDER THE TONGUE EVERY 5 MINUTES FOR 3 DOSES AS NEEDED FOR CHEST PAIN. 14.7 g 0  . pravastatin (PRAVACHOL) 10 MG tablet TAKE ONE TABLET (10 MG TOTAL) BY MOUTH DAILY. 90 tablet 3  . rOPINIRole (REQUIP) 0.25 MG tablet TAKE ONE TABLET (0.25MG TOTAL) BY MOUTH DAILY. TAKE TWO HOURS PRIOR TO BEDTIME EVERY NIGHT. 90 tablet 3  . silver nitrate applicators 38-10 % applicator Use as needed to stop superficial skin bleeding. 10 each 0  . spironolactone (ALDACTONE) 25 MG tablet Take 12.5 mg by mouth daily.    Marland Kitchen torsemide (DEMADEX) 20 MG tablet Take 2 tablets (40 mg total) by mouth daily. 180 tablet 3  . ULORIC 40 MG tablet Take 1 tablet (40 mg total) by mouth at bedtime. 90 tablet 3  . UNABLE TO FIND Med Name: So Clean CPAP cleaning system (Patient taking differently: Med Name: BiPap) 1 each 0  . VASCEPA 1 g capsule TAKE 2 CAPSULES BY MOUTH TWICE A DAY 360 capsule 3  . Wheat Dextrin (BENEFIBER) POWD Take 1 Dose by mouth daily.      No current facility-administered medications for this visit.    ALLERGIES:  Allergies  Allergen Reactions  . Avandia [Rosiglitazone Maleate] Other (See Comments)    Congestive heart failure  . Cephalexin Anaphylaxis, Swelling and Rash    Extremities swell , throat swelling   . Clindamycin Anaphylaxis and Shortness Of Breath    Other reaction(s): esophageal spasms  . Clindamycin Hcl Anaphylaxis and  Shortness Of Breath  . Humalog [Insulin Lispro] Itching     big knots and whelp   . Lincomycin Anaphylaxis and Shortness Of Breath  . Metformin Other (See Comments)    Congestive heart failure  . Novolog [Insulin Aspart] Hives and Itching    Humalog & Novolog big knots and whelp    . Penicillins Anaphylaxis, Hives, Swelling and Rash    Did it involve swelling of the face/tongue/throat, SOB, or low BP? Yes Did it involve sudden or severe rash/hives, skin peeling, or any reaction on the inside of your mouth or nose? No Did you need to seek medical attention  at a hospital or doctor's office? Yes When did it last happen?Childhood allergy  If all above answers are "NO", may proceed with cephalosporin use.   Marland Kitchen Rosiglitazone Other (See Comments)    Congestive heart failure  . Tizanidine Other (See Comments)    Dizziness, Mental Status Changes, Hallucination    . Adhesive [Tape] Other (See Comments)    Tears skin, Please use "paper" tape  . Atorvastatin Other (See Comments)    increased LFT's, no muscle weakness, leg pain Muscle and joint pain increased LFT's  Increased LFTs  . Cholestyramine Other (See Comments)    Liver Disorder Elevated LFTs    . Gentamycin [Gentamicin] Hives, Itching and Rash    Fine red bumps.  Reaction noted post loop recorder implant, where gentamycin was used for irrigation. Topical prep  . Iodinated Diagnostic Agents Other (See Comments)    PAIN DURING LYMPHANGIOGRAM '81, NO ALLERGY TO IV CONTRAST.Has had procedures with Iodine since without problems.   . Limonene Hives, Itching, Rash and Other (See Comments)    Reaction noted post loop recorder implant, where gentamycin was used for irrigation. Topical prep  . Pravastatin Other (See Comments)    In high doses (20-74m) causes muscle and joint pain  Per pt. "can only take in low doses".   . Prednisone Itching and Other (See Comments)    B/P went high, itching all over.  Tolerates with benadryl   . Ranexa [Ranolazine] Other (See Comments)    Nausea, dizziness, low blood sugar, tingling in right hand, "blood blisters" on tongue   . Rosuvastatin Nausea And Vomiting, Nausea Only and Other (See Comments)    Muscle and joint pain & increased LFTs; tolerates Pravastatin 114m(max) Other reaction(s): elevated LFTs  . Simvastatin Other (See Comments)    Increased LFT's   . Strawberry Extract Hives, Itching, Rash and Other (See Comments)    Reaction noted post loop recorder implant, where gentamycin was used for irrigation. Topical prep.  Can eat fresh  strawberries   . Azelastine     Other reaction(s): metallic taste,spitting up blood  . Clopidogrel Other (See Comments)    Does not work for patient Medicine was not effective Other reaction(s): Unknown  . Lisinopril     Other reaction(s): nausea and loose stools  . Oseltamivir Phosphate     Other reaction(s): Nausea and vomiting  . Tizanidine Hcl     Other reaction(s): dizzy, hallucinations  . Codeine Nausea And Vomiting  . Erythromycin Nausea And Vomiting  . Hydrocodone-Acetaminophen Itching, Rash and Other (See Comments)    itching  . Latex Itching and Other (See Comments)    I"if wear gloves hands get red ,itchy no prioblem if other wear and touch me"  . Neomycin Rash and Other (See Comments)    Redness  . Nickel Itching  . Ranitidine Nausea Only  .  Tamiflu [Oseltamivir] Nausea And Vomiting  . Tramadol Nausea Only    PHYSICAL EXAM:  Performance status (ECOG): 1 - Symptomatic but completely ambulatory  Vitals:   11/07/20 1518  BP: 118/61  Pulse: 79  Resp: 18  Temp: (!) 96.9 F (36.1 C)  SpO2: 100%   Wt Readings from Last 3 Encounters:  11/07/20 119 lb (54 kg)  07/06/20 117 lb 12.8 oz (53.4 kg)  07/05/20 113 lb (51.3 kg)   Physical Exam Vitals reviewed.  Constitutional:      Appearance: Normal appearance.  Cardiovascular:     Rate and Rhythm: Normal rate and regular rhythm.     Pulses: Normal pulses.     Heart sounds: Normal heart sounds.  Pulmonary:     Effort: Pulmonary effort is normal.     Breath sounds: Normal breath sounds.  Chest:  Breasts:     Right: No axillary adenopathy or supraclavicular adenopathy.     Left: No axillary adenopathy or supraclavicular adenopathy.    Abdominal:     Palpations: Abdomen is soft. There is no hepatomegaly, splenomegaly or mass.     Tenderness: There is no abdominal tenderness.     Hernia: No hernia is present.  Lymphadenopathy:     Cervical: No cervical adenopathy.     Upper Body:     Right upper body: No  supraclavicular, axillary or pectoral adenopathy.     Left upper body: No supraclavicular, axillary or pectoral adenopathy.  Neurological:     General: No focal deficit present.     Mental Status: She is alert and oriented to person, place, and time.  Psychiatric:        Mood and Affect: Mood normal.        Behavior: Behavior normal.     LABORATORY DATA:  I have reviewed the labs as listed.  CBC Latest Ref Rng & Units 10/31/2020 04/25/2020 02/24/2020  WBC 4.0 - 10.5 K/uL 8.5 8.4 9.2  Hemoglobin 12.0 - 15.0 g/dL 12.3 12.1 10.6(L)  Hematocrit 36.0 - 46.0 % 38.3 38.0 33.7(L)  Platelets 150 - 400 K/uL 115(L) 124(L) 116(L)   CMP Latest Ref Rng & Units 10/31/2020 04/25/2020 04/20/2020  Glucose 70 - 99 mg/dL 244(H) 210(H) 273(H)  BUN 8 - 23 mg/dL 72(H) 62(H) 57(H)  Creatinine 0.44 - 1.00 mg/dL 2.00(H) 1.77(H) 1.68(H)  Sodium 135 - 145 mmol/L 138 136 137  Potassium 3.5 - 5.1 mmol/L 4.7 4.8 4.3  Chloride 98 - 111 mmol/L 100 100 99  CO2 22 - 32 mmol/L 25 26 27   Calcium 8.9 - 10.3 mg/dL 10.2 10.0 10.1  Total Protein 6.5 - 8.1 g/dL 8.6(H) 8.0 -  Total Bilirubin 0.3 - 1.2 mg/dL 0.6 0.8 -  Alkaline Phos 38 - 126 U/L 97 113 -  AST 15 - 41 U/L 23 28 -  ALT 0 - 44 U/L 18 19 -      Component Value Date/Time   RBC 4.11 10/31/2020 1308   MCV 93.2 10/31/2020 1308   MCV 92 09/16/2019 1533   MCV 88.8 12/13/2016 1427   MCH 29.9 10/31/2020 1308   MCHC 32.1 10/31/2020 1308   RDW 13.6 10/31/2020 1308   RDW 13.2 09/16/2019 1533   RDW 14.6 (H) 12/13/2016 1427   LYMPHSABS 1.2 10/31/2020 1308   LYMPHSABS 1.2 12/13/2016 1427   MONOABS 0.6 10/31/2020 1308   MONOABS 0.8 12/13/2016 1427   EOSABS 0.1 10/31/2020 1308   EOSABS 0.2 12/13/2016 1427   BASOSABS 0.1 10/31/2020 1308  BASOSABS 0.1 12/13/2016 1427    DIAGNOSTIC IMAGING:  I have independently reviewed the scans and discussed with the patient. No results found.   ASSESSMENT:  1. Mild thrombocytopenia: -CT of the abdomen did not show any  splenomegaly. Previous CT disorder work-up was negative. -Peripheral blood smear showed giant platelets. Clinically consistent with immune mediated thrombocytopenia.  2. Mild hypercalcemia: -Her calcium level for the last 3 times in our clinic was normal to upper limit of normal. -Previous vitamin D was 75. Intact PTH was normal. Nonparathyroid hypercalcemia. Calcitriol was 16.5. PTH RP was less than 2. -SPEP and immunofixation was negative. Free light chain ratio was elevated.  3. 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 -Last mammogram on 08/04/2019 reviewed by me was BI-RADS Category 4 with 0.6 cm mixed echogenicity mass in the right breast at 9:00. -Aspiration of this 0.6 cm complex cyst was successful on 08/24/2019. Aspiration of the 0.4 cm cyst in the right breast 9:00 was successful. Routine annual mammogram was recommended.  4. Stage IIb Hodgkin's disease: -She received mantle radiation in 1981. Continues to be in remission since then. Does not have any B symptoms.  5. Chromophobe renal cell carcinoma: -Nephrectomy in 2008. Last CT in September 2019 showed stable 14 mm left renal lesion.   PLAN:  1. Mild thrombocytopenia: -She has mild thrombocytopenia with no bleeding.  This is clinically consistent with immune related thrombocytopenia. - She does have bruising over the abdomen and upper thighs.  She is also on dual antiplatelet therapy. - No active intervention needed at this time. - RTC 6 months for follow-up with labs.  2. Mild hypercalcemia: -Previous work-up including SPEP, immunofixation was negative. - Calcium on 10/31/2020 was normal at 10.2 with albumin 4.4. - Free light chain ratio has also improved to 1.82 from 2.4 previously.  3.  Health maintenance: -Mammogram reportedly done in January 2022 at [facility] and thought to be normal.  Orders placed this encounter:  Orders Placed This Encounter  Procedures   . CBC with Differential/Platelet  . Comprehensive metabolic panel  . Lactate dehydrogenase  . Kappa/lambda light chains  . Protein electrophoresis, serum  . Immunofixation electrophoresis     Derek Jack, MD [place] [phone]   I, Milinda Antis, am acting as a scribe for Dr. Sanda Linger.  I, Derek Jack MD, have reviewed the above documentation for accuracy and completeness, and I agree with the above.

## 2020-11-07 NOTE — Patient Instructions (Signed)
[place] at [facility] Discharge Instructions  You were seen today by Dr. Delton Coombes. He went over your recent results. Dr. Delton Coombes will see you back in 6 months for labs and follow up.   Thank you for choosing [place] at [facility] to provide your oncology and hematology care.  To afford each patient quality time with our provider, please arrive at least 15 minutes before your scheduled appointment time.   If you have a lab appointment with the [facility] please come in thru the Main Entrance and check in at the main information desk  You need to re-schedule your appointment should you arrive 10 or more minutes late.  We strive to give you quality time with our providers, and arriving late affects you and other patients whose appointments are after yours.  Also, if you no show three or more times for appointments you may be dismissed from the clinic at the providers discretion.     Again, thank you for choosing [facility].  Our hope is that these requests will decrease the amount of time that you wait before being seen by our physicians.       _____________________________________________________________  Should you have questions after your visit to [facility], please contact our office at [phone] between the hours of 8:00 a.m. and 4:30 p.m.  Voicemails left after 4:00 p.m. will not be returned until the following business day.  For prescription refill requests, have your pharmacy contact our office and allow 72 hours.    Cancer Center Support Programs:   > Cancer Support Group  2nd Tuesday of the month 1pm-2pm, Journey Room

## 2020-11-08 DIAGNOSIS — Z7189 Other specified counseling: Secondary | ICD-10-CM | POA: Diagnosis not present

## 2020-11-08 DIAGNOSIS — E109 Type 1 diabetes mellitus without complications: Secondary | ICD-10-CM | POA: Diagnosis not present

## 2020-11-14 DIAGNOSIS — G4733 Obstructive sleep apnea (adult) (pediatric): Secondary | ICD-10-CM | POA: Diagnosis not present

## 2020-11-24 ENCOUNTER — Encounter (HOSPITAL_COMMUNITY): Payer: Self-pay | Admitting: Cardiology

## 2020-11-24 ENCOUNTER — Ambulatory Visit (HOSPITAL_COMMUNITY)
Admission: RE | Admit: 2020-11-24 | Discharge: 2020-11-24 | Disposition: A | Discharge status: Home / Self Care | Payer: Medicare PPO | Source: Ambulatory Visit | Attending: Cardiology | Admitting: Cardiology

## 2020-11-24 ENCOUNTER — Encounter (HOSPITAL_COMMUNITY): Payer: Self-pay

## 2020-11-24 ENCOUNTER — Other Ambulatory Visit: Payer: Self-pay

## 2020-11-24 VITALS — BP 118/60 | HR 66 | Wt 120.6 lb

## 2020-11-24 DIAGNOSIS — Z794 Long term (current) use of insulin: Secondary | ICD-10-CM | POA: Diagnosis not present

## 2020-11-24 DIAGNOSIS — N281 Cyst of kidney, acquired: Secondary | ICD-10-CM | POA: Diagnosis not present

## 2020-11-24 DIAGNOSIS — Z7982 Long term (current) use of aspirin: Secondary | ICD-10-CM | POA: Insufficient documentation

## 2020-11-24 DIAGNOSIS — Z7901 Long term (current) use of anticoagulants: Secondary | ICD-10-CM | POA: Insufficient documentation

## 2020-11-24 DIAGNOSIS — I130 Hypertensive heart and chronic kidney disease with heart failure and stage 1 through stage 4 chronic kidney disease, or unspecified chronic kidney disease: Secondary | ICD-10-CM | POA: Insufficient documentation

## 2020-11-24 DIAGNOSIS — Z79899 Other long term (current) drug therapy: Secondary | ICD-10-CM | POA: Insufficient documentation

## 2020-11-24 DIAGNOSIS — Z9641 Presence of insulin pump (external) (internal): Secondary | ICD-10-CM | POA: Insufficient documentation

## 2020-11-24 DIAGNOSIS — Z881 Allergy status to other antibiotic agents status: Secondary | ICD-10-CM | POA: Diagnosis not present

## 2020-11-24 DIAGNOSIS — I25110 Atherosclerotic heart disease of native coronary artery with unstable angina pectoris: Secondary | ICD-10-CM | POA: Insufficient documentation

## 2020-11-24 DIAGNOSIS — Z923 Personal history of irradiation: Secondary | ICD-10-CM | POA: Insufficient documentation

## 2020-11-24 DIAGNOSIS — Z880 Allergy status to penicillin: Secondary | ICD-10-CM | POA: Diagnosis not present

## 2020-11-24 DIAGNOSIS — I495 Sick sinus syndrome: Secondary | ICD-10-CM | POA: Diagnosis not present

## 2020-11-24 DIAGNOSIS — G4733 Obstructive sleep apnea (adult) (pediatric): Secondary | ICD-10-CM | POA: Diagnosis not present

## 2020-11-24 DIAGNOSIS — Z7951 Long term (current) use of inhaled steroids: Secondary | ICD-10-CM | POA: Insufficient documentation

## 2020-11-24 DIAGNOSIS — D696 Thrombocytopenia, unspecified: Secondary | ICD-10-CM | POA: Diagnosis not present

## 2020-11-24 DIAGNOSIS — Z8249 Family history of ischemic heart disease and other diseases of the circulatory system: Secondary | ICD-10-CM | POA: Insufficient documentation

## 2020-11-24 DIAGNOSIS — Z905 Acquired absence of kidney: Secondary | ICD-10-CM | POA: Insufficient documentation

## 2020-11-24 DIAGNOSIS — Z955 Presence of coronary angioplasty implant and graft: Secondary | ICD-10-CM | POA: Insufficient documentation

## 2020-11-24 DIAGNOSIS — I480 Paroxysmal atrial fibrillation: Secondary | ICD-10-CM

## 2020-11-24 DIAGNOSIS — Z950 Presence of cardiac pacemaker: Secondary | ICD-10-CM | POA: Insufficient documentation

## 2020-11-24 DIAGNOSIS — E1022 Type 1 diabetes mellitus with diabetic chronic kidney disease: Secondary | ICD-10-CM | POA: Insufficient documentation

## 2020-11-24 DIAGNOSIS — Z953 Presence of xenogenic heart valve: Secondary | ICD-10-CM | POA: Diagnosis not present

## 2020-11-24 DIAGNOSIS — Z8571 Personal history of Hodgkin lymphoma: Secondary | ICD-10-CM | POA: Diagnosis not present

## 2020-11-24 DIAGNOSIS — R002 Palpitations: Secondary | ICD-10-CM | POA: Insufficient documentation

## 2020-11-24 DIAGNOSIS — Z885 Allergy status to narcotic agent status: Secondary | ICD-10-CM | POA: Insufficient documentation

## 2020-11-24 DIAGNOSIS — Z803 Family history of malignant neoplasm of breast: Secondary | ICD-10-CM | POA: Insufficient documentation

## 2020-11-24 DIAGNOSIS — Z833 Family history of diabetes mellitus: Secondary | ICD-10-CM | POA: Insufficient documentation

## 2020-11-24 DIAGNOSIS — N184 Chronic kidney disease, stage 4 (severe): Secondary | ICD-10-CM | POA: Diagnosis not present

## 2020-11-24 DIAGNOSIS — I5032 Chronic diastolic (congestive) heart failure: Secondary | ICD-10-CM | POA: Diagnosis not present

## 2020-11-24 DIAGNOSIS — E785 Hyperlipidemia, unspecified: Secondary | ICD-10-CM | POA: Insufficient documentation

## 2020-11-24 DIAGNOSIS — C649 Malignant neoplasm of unspecified kidney, except renal pelvis: Secondary | ICD-10-CM | POA: Diagnosis not present

## 2020-11-24 DIAGNOSIS — Z951 Presence of aortocoronary bypass graft: Secondary | ICD-10-CM | POA: Insufficient documentation

## 2020-11-24 DIAGNOSIS — Z888 Allergy status to other drugs, medicaments and biological substances status: Secondary | ICD-10-CM | POA: Diagnosis not present

## 2020-11-24 DIAGNOSIS — Z9013 Acquired absence of bilateral breasts and nipples: Secondary | ICD-10-CM | POA: Insufficient documentation

## 2020-11-24 DIAGNOSIS — Z853 Personal history of malignant neoplasm of breast: Secondary | ICD-10-CM | POA: Insufficient documentation

## 2020-11-24 LAB — BASIC METABOLIC PANEL
Anion gap: 9 (ref 5–15)
BUN: 53 mg/dL — ABNORMAL HIGH (ref 8–23)
CO2: 25 mmol/L (ref 22–32)
Calcium: 9.6 mg/dL (ref 8.9–10.3)
Chloride: 106 mmol/L (ref 98–111)
Creatinine, Ser: 1.72 mg/dL — ABNORMAL HIGH (ref 0.44–1.00)
GFR, Estimated: 32 mL/min — ABNORMAL LOW (ref >60)
Glucose, Bld: 152 mg/dL — ABNORMAL HIGH (ref 70–99)
Potassium: 4.2 mmol/L (ref 3.5–5.1)
Sodium: 140 mmol/L (ref 135–145)

## 2020-11-24 MED ORDER — TORSEMIDE 20 MG PO TABS: ORAL_TABLET | ORAL | 3 refills | Status: DC

## 2020-11-24 NOTE — Progress Notes (Signed)
Date:  11/24/2020   ID:  [name], DOB 27-Sep-1950, MRN [id number]   Provider location: [place] Type of Visit: Established patient  PCP:  [name], MD  Cardiologist:  None Primary HF: Dr. Aundra Dubin   History of Present Illness: SHAKENA CALLARI is a 70 y.o. female who has a history of CAD s/p CABG in 12/05 and redo in 1/11, bioprosthetic AVR, diastolic CHF, and sick sinus syndrome s/p PCM presents for cardiology followup.  She had initial CABG with SVG-RCA in 12/05 followed by redo SVG-RCA and bioprosthetic AVR in 1/11.  She has had diastolic CHF and is on Lasix.  In 9/14, she had a CPX test.  This showed severe functional limitation with ischemic ECG changes (inferolateral ST depression and ST elevation in V1 and V2). She had chest pain.  She had R/LHC in 9/14.  This showed elevated left and right heart filling pressures, patent SVG-PDA, and 80% distal LM/80% ostial LAD stenosis.  She was not a candidate for redo CABG (had 2 prior sternotomies as well as chest radiation for Hodgkins lymphoma).  She had DES from left main into proximal LAD and will need long-term DAPT => Brilinta was used as she has been a poor responder to Plavix in the past.  She re-developed angina in 11/15 and was admitted with unstable angina.  LHC showed 80% ostial LM and patent SVG-RCA. She had DES to ostial LM. Echo in 1/18 showed EF 60-65%, normal bioprosthetic aortic valve, mild-moderate MR, PASP 35 mmHg.   She has chronic iron deficiency anemia thought to be due to a slow GI bleed.  Last EGD was unremarkable and a capsule endoscopy was also unremarkable.  She has had periodic transfusions.    She had bilateral mastectomy for DCIS in 65/55 without complication.  She has an insulin pump followed by an endocrinologist at [facility].   Given myalgias with statins, she was started on Praluent.  LDL is now excellent.    In the past she has had short runs of atrial fibrillation.  She  is not anticoagulated. Planned for Watchman placement, but given her nickel allergy, we were unable to place a Watchman.     Diagnosed with OSA, now on CPAP.   Given ongoing problems with exertional dyspnea and occasional chest pain, Cardiolite was repeated in 9/18 and showed inferior changes.  She underwent left/right heart cath in 9/18.  This showed elevated filling pressures, R>L.  There was 75+% ostial LCx stenosis.  However, there were 2 layers of stent from the mid left main into the ostial LAD, and LCx intervention was thought to be very risky.  It was decided to manage her medically.  Echo was repeated in 10/18 showing EF 55-60%, moderate to severe TR.    Given recurrent chest pain, she had ETT-Cardiolite done in 3/19.  This was a normal study with no evidence for ischemia or infarction.  Echo in 1/20 showed EF 55-60%, mild LVH, stable bioprosthetic aortic valve, moderate-severe TR.    Recently, she has been more short of breath with exertion and renal function has been worse.  She was set up for echo and RHC.  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  [place] in 8/20 showed primarily right-sided HF.  Cardiolite in  8/20 showed no ischemia or infarction.  Torsemide was increased and she cut back on sodium in her diet.    CPX in 11/20 showed severe functional limitation from HF.   She saw Dr. Mosetta Pigeon at [facility] for evaluation for heart/kidney transplant (for restrictive cardiomyopathy).  She was determined to be a poor surgical candidate due to extensive vascular calcifications.   CT chest in 8/21 showed a loculated right pleural effusion but unable to find enough fluid by ultrasound for thoracentesis.   Echo in 9/21 showed EF 60-65% with mild LVH, mild RV dilation  with mildly decreased systolic function and a D-shaped septum, PASP 40, normal bioprosthetic aortic valve, rheumatic-appearing MV with mild-moderate mitral stenosis (mean gradient 9 but MVA by PHT 2.7 cm^2), trivial MR.   She returns today for followup of CHF and CAD.  Weight up 3 lbs. Symptoms are up and down.  She is short of breath walking up stair or inclines.  Rare atypical chest pain (transient, not exertional).  She feels palpitations at times, occasional lightheadedness when palpitations occur.  No syncope.      ECG (personally reviewed): NSR, 1st degree AVB (226 msec), LVH  Labs (2/19): hgb 13.2 Labs @ [facility] (3/19): K 4.7, creatinine 1.4, ANA negative, RF negative Labs (4/19): K 3.9, creatinine 1.52 => 1.63 Labs (6/19): LDL 8, HDL 42, TGs 259 Labs (9/19): K 4.9, creatinine 1.65, hgb 13.4, plts 129, LDL 46,TGs 182 Labs (12/19): K 4.9, creatinine 2.07, hgb 12.4 Labs (3/20): K 4, creatinine 1.8 Labs (6/20): K 4.7, creatinine 1.47 Labs (8/20): creatinine 2.13 => 2.46 => 2.4, LDL 22 Labs (9/20): K 4.8, creatinine 2.74, AST/ALT normal, hgb 12.6 Labs (1/21): K 4.1, creatinine 1.86, hgb 11.7 Labs (4/21): K 4.7, creatinine 1.7 Labs (6/21): K 4.2, creatinine 2 => 1.7 Labs (8/21): K 3.8, creatinine 1.69, hgb 10.6, plts 116 Labs (9/21): LDL 34, TGs 209 Labs (1/22): K 3.8, creatinine 2.0 Labs (3/22): LDL 25 Labs (4/22): K 4.7, creatinine 2.0, hgb 12.3  PMH: 1. Sick sinus syndrome s/p [employer] PCM.  2. HTN 3. H/o SBO 4. Renal cell carcinoma s/p right nephrectomy in 2008.  5. 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  Fibroscan in 11/14 showed advanced fibrosis concerning for cirrhosis. EGD in 11/14 showed no varices.  6. Diastolic CHF: Echo (6/76) with EF 60-65%, bioprosthetic aortic valve with mean gradient 15 mmHg, mild MR.  CPX (9/14): peak VO2 10.4, VE/VCO2 43.8, inferolateral ST depression and ST elevation in V1/V2 with severe dyspnea and chest pain, severe functional limitation with  ischemic changes.  Echo (9/14) with EF 60-65%, normal RV size and systolic function, mild-moderate MR, bioprosthetic aortic valve normal, PA systolic pressure 51 mmHg, no evidence for pericardial constriction though exam incomplete for constriction.  Echo (11/15) with EF 55-60%, bioprosthetic aortic valve, mild-moderate MR, moderate TR, PA systolic pressure 42 mmHg.  - TEE (5/17) with EF 60-65%, normal bioprosthetic aortic valve with mean gradient 13 mmHg, normal RV size and systolic function. - Echo (1/18) with EF 60-65%, normal bioprosthetic aortic valve, mild-moderate MR, PASP 35 mmHg.  - Echo (10/18) with EF 55-60%, bioprosthetic aortic valve mean gradient 10 mmHg, MAC with mild mitral stenosis (mean gradient 7) and mild mitral regurgitation, moderate to severe TR, PASP 43 mmHg.  - RHC (9/18): mean RA 13, PA 44/18 mean 33, mean PCWP 19, CI 3.45, PVR 2.7 WU.  - Echo (1/20): EF 55-60%, mild LVH, mild MR, stable bioprosthetic aortic valve, moderate-severe TR.  - RHC (8/20): mean RA  14, PA 43/15 mean 28, mean PCWP 13, PAPI 2, PVR 4.2, CI 2.31 - Echo (8/20): EF 55-60%, RV mildly dilated with normal systolic function, mildly D-shaped septum suggestive of RV pressure/volume overload, rheumatic-appearing mitral valve with mild mitral stenosis (mean gradient 6, MVA by PHT 4 cm^2), no evidence for pericardial constriction, moderate TR, bioprosthetic aortic valve functioning normally with mean gradient 6 mmHg.  - CPX (11/20): peak VO2 9.8, VE/VCO2 slope 38, RER 1.11 => severe functional limitation from HF.   - Echo (9/21): EF 60-65% with mild LVH, mild RV dilation with mildly decreased systolic function and a D-shaped septum, PASP 40, normal bioprosthetic aortic valve, rheumatic-appearing MV with mild-moderate mitral stenosis (mean gradient 9 but MVA by PHT 2.7 cm^2, trivial MR.  7. TAH-BSO 8. Type I diabetes: Has insulin pump.  9. H/o ischemic colitis. 10. H/o PUD. 11. Diabetic gastroparesis. 12.  Hyperlipidemia: Myalgias with Crestor and atorvastatin, elevated LFTs with simvastatin.  Myalgias with > 10 mg pravastatin. Now on Praluent.  13. CKD stage IV: Sees Dr. Lorrene Reid 14. Bicuspid aortic valve s/p 19 mm Edwards pericardial valve in 1/11 (had post-operative Dresslers syndrome).   15. CAD: CABG with SVG-RCA in 12/05.  Redo SVG-RCA in 1/11 with AVR.  LHC/RHC (9/14) with mean RA 18, PA 62/25 mean 43, mean PCWP 26, CI 2.8, 70-80% distal LM stenosis, 80% ostial LAD stenosis, total occlusion RCA, SVG-PDA patent.  Patient had DES LM into LAD.  Plan to continue long-term ASA/Brilinta (poor responder to Plavix).  ETT-cardiolite (4/15) with 6' exercise, EF 60%, ST depression in recovery and mild chest pain, no ischemia or infarction by perfusion images.  Unstable angina 11/15 with 80% LM, patent SVG-RCA => DES to ostial LM.   - Lexiscan Cardiolite (2/18): EF 62%, no ischemia or infarction (normal).  She had a significant reaction to [place] and probably should not get again.  - Cardiolite (9/18): EF 72%, prior inferior infarct with mild peri-infarct ischemia.  - LHC (9/18): 2 layers stent left main - ostial LAD with 50% dLM in-stent restenosis, 75+% ostial LCx, totally occluded RCA, SVG-RCA patent.  Medical management planned.  - ETT-Cardiolite (4/19): EF normal, 7'11" exercise, no ischemia or infarction.  - Lexiscan Cardiolite (8/20): No ischemia/infarction.  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  17. Anemia: Iron-deficiency.  Suspect chronic GI blood loss.  EGD in 9/14 was unremarkable.  Capsule endoscopy 10/14 was unremarkable.  Has required periodic blood transfusions.  18. Atrial fibrillation: Paroxysmal, noted by [facility] interrogation.  19. Carotid stenosis: Carotid dopplers (2/15) with 40-59% bilateral stenosis. Carotid dopplers (3/16) with 40-59% bilateral ICA stenosis. Carotid dopplers (3/17) with 40-59% bilateral ICA stenosis.  - Carotid dopplers (3/18)  with mild BICA stenosis.  20. 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  22. C difficile colitis 2017 23. Gout 24. ?TIA: head CT negative.  25. OSA: Moderate, uses CPAP.  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 Hypercalcemia 28. 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  Mild-moderate on 9/21 echo.  29. Tricuspid regurgitation: Moderate by 8/20 echo.  30. Lung nodules: Followed at [facility].   Current Outpatient Medications  Medication Sig Dispense Refill  . acyclovir ointment (ZOVIRAX) 5 % Apply 1 application topically every 3 (three) hours as needed (fever blister/cold sores).    . ALPRAZolam (XANAX) 0.5 MG tablet Take 1 tablet (0.5 mg total) by mouth 2 (two) times  daily as needed for anxiety. 60 tablet 1  . APIDRA 100 UNIT/ML injection Inject 40 Units into the skin as directed. INFUSE THROUGH INSULIN PUMP UTD, Bolus depends on carb intake  2  . aspirin EC 81 MG tablet Take 81 mg by mouth at bedtime.    Marland Kitchen BRILINTA 60 MG TABS tablet TAKE ONE TABLET (60MG TOTAL) BY MOUTH TWICE DAILY 180 tablet 3  . cetirizine (ZYRTEC) 10 MG tablet Take 10 mg by mouth daily.    . clobetasol (OLUX) 0.05 % topical foam Apply 1 application topically 2 (two) times daily as needed (skin flaking).    . Coenzyme Q10 (CO Q 10) 100 MG CAPS Take 100 mg by mouth at bedtime.     . colchicine 0.6 MG tablet Take 0.6 mg by mouth daily as needed (gout flare).    . CONTOUR NEXT TEST test strip USE TO TEST BLOOD SUGAR UP TO EIGHT TIMES A DAY    . diclofenac Sodium (VOLTAREN) 1 % GEL as needed.    . docusate sodium (COLACE) 100 MG capsule Take 100 mg by mouth 2 (two) times daily.    . Evolocumab (REPATHA SURECLICK) 793 MG/ML SOAJ Inject 140 mg into the skin every 14 (fourteen) days. 6 pen 3  . famotidine (PEPCID) 40 MG tablet Take 40 mg by mouth daily.    Marland Kitchen glucagon (GLUCAGON EMERGENCY) 1 MG  injection Inject 1 mg into the vein once as needed (low blood sugar).     . isosorbide mononitrate (IMDUR) 120 MG 24 hr tablet TAKE ONE TABLET (120MG TOTAL) BY MOUTH DAILY 90 tablet 3  . latanoprost (XALATAN) 0.005 % ophthalmic solution Place 1 drop into both eyes at bedtime.    . metolazone (ZAROXOLYN) 2.5 MG tablet Take 2.5 mg (1 tab) once as directed by CHF clinic. Call before taking [phone] 3 tablet 0  . metoprolol tartrate (LOPRESSOR) 50 MG tablet Take 1 tablet (50 mg total) by mouth 2 (two) times daily. 60 tablet 3  . mometasone (NASONEX) 50 MCG/ACT nasal spray Place into the nose.    . mupirocin cream (BACTROBAN) 2 % Apply 1 application topically 2 (two) times daily as needed. 90 g 0  . nitroGLYCERIN (NITROLINGUAL) 0.4 MG/SPRAY spray PLACE 1 SPRAY UNDER THE TONGUE EVERY 5 MINUTES FOR 3 DOSES AS NEEDED FOR CHEST PAIN. 14.7 g 0  . polyethylene glycol (MIRALAX / GLYCOLAX) 17 g packet Take 17 g by mouth daily.    . pravastatin (PRAVACHOL) 10 MG tablet TAKE ONE TABLET (10 MG TOTAL) BY MOUTH DAILY. 90 tablet 3  . rOPINIRole (REQUIP) 0.5 MG tablet Take 0.5 mg by mouth at bedtime. Additional .25 mg  as needed    . silver nitrate applicators 07-62 % applicator Use as needed to stop superficial skin bleeding. 10 each 0  . spironolactone (ALDACTONE) 25 MG tablet Take 12.5 mg by mouth daily.    Marland Kitchen ULORIC 40 MG tablet Take 1 tablet (40 mg total) by mouth at bedtime. 90 tablet 3  . UNABLE TO FIND Med Name: So Clean CPAP cleaning system 1 each 0  . VASCEPA 1 g capsule TAKE 2 CAPSULES BY MOUTH TWICE A DAY 360 capsule 3  . Wheat Dextrin (BENEFIBER) POWD Take 1 Dose by mouth daily.     Marland Kitchen torsemide (DEMADEX) 20 MG tablet Take 26m (3 tablets) by mouth daily alternating with 427m(2 tablets) daily 270 tablet 3   No current facility-administered medications for this encounter.    Allergies:  Avandia [rosiglitazone maleate], Cephalexin, Clindamycin, Clindamycin hcl, Humalog [insulin lispro], Lincomycin,  Metformin, Novolog [insulin aspart], Penicillins, Rosiglitazone, Tizanidine, Adhesive [tape], Atorvastatin, Cholestyramine, Gentamycin [gentamicin], Iodinated diagnostic agents, Limonene, Pravastatin, Prednisone, Ranexa [ranolazine], Rosuvastatin, Simvastatin, Strawberry extract, Azelastine, Clopidogrel, Lisinopril, Oseltamivir phosphate, Tizanidine hcl, Codeine, Erythromycin, Hydrocodone-acetaminophen, Latex, Neomycin, Nickel, Ranitidine, Tamiflu [oseltamivir], and Tramadol   Social History:  The patient  reports that she has never smoked. She has never used smokeless tobacco. She reports that she does not drink alcohol and does not use drugs.   Family History:  The patient's family history includes Breast cancer (age of onset: 104) in her sister; Cancer (age of onset: 73) in her sister; Cancer (age of onset: 15) in her maternal grandfather; Diabetes in her father; Glaucoma in her brother; Heart attack in her father, maternal grandmother, and mother; Heart disease in her father and mother; Hypertension in her brother; Lung cancer in her sister.   ROS:  Please see the history of present illness.   All other systems are personally reviewed and negative.   Exam:   BP 118/60   Pulse 66   Wt 54.7 kg (120 lb 9.6 oz)   SpO2 98%   BMI 21.71 kg/m  General: NAD Neck: JVP 8-9 cm, no thyromegaly or thyroid nodule.  Lungs: Clear to auscultation bilaterally with normal respiratory effort. CV: Nondisplaced PMI.  Heart regular S1/S2, no S3/S4, no murmur.  No peripheral edema.  No carotid bruit.  Normal pedal pulses.  Abdomen: Soft, nontender, no hepatosplenomegaly, mild distention.  Skin: Intact without lesions or rashes.  Neurologic: Alert and oriented x 3.  Psych: Normal affect. Extremities: No clubbing or cyanosis.  HEENT: Normal.   Recent Labs: 10/31/2020: ALT 18; Hemoglobin 12.3; Platelets 115 11/24/2020: BUN 53; Creatinine, Ser 1.72; Potassium 4.2; Sodium 140  Personally reviewed   Wt Readings from  Last 3 Encounters:  11/24/20 54.7 kg (120 lb 9.6 oz)  11/07/20 54 kg (119 lb)  07/06/20 53.4 kg (117 lb 12.8 oz)    ASSESSMENT AND PLAN:  1. CAD: s/p SVG-RCA in 12/05, then redo SVG-RCA in 1/11-->distal left main/proximal LAD severe stenosis in 9/14. PCI with DES to distal LM/proximal LAD was done rather than bypass as she would have been a poor candidate for redo sternotomy (2 prior sternotomies and history of chest wall radiation for Hodgkins disease). Recurrent unstable angina with 80% ostial LM treated with repeat DES in 11/15.  Lexiscan Cardiolite in 9/18 inferior changes.  Repeat LHC (9/18) showed 50% distal left main in-stent restenosis and 75+% ostial LCx stenosis. I reviewed the films at the time with interventional cardiology => would be very difficult to intervene on the ostial LCx through 2 overlapping stents from left main into the LAD.  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 Given no ACS, we planned medical management and increased Imdur.  Cardiolite in 3/19 and again in 8/20 showed no ischemia or infarction.  Rare atypical chest pain. - Continue Brilinta 60 mg bid (poor responder to Plavix) long-term given recurrent left main disease.  - Continue ASA 81, statin/Praluent/Vascepa.  - Continue Imdur 120 mg daily . - Continue metoprolol 50 mg bid but may need to decrease/stop if she develops more advanced heart block (currently 1st degree AVB) and RV paces more (currently only rare).   - She was unable to tolerate ranolazine.  2. Bioprosthetic aortic valve replacement: In setting of bicuspid aortic valve, thoracic aorta not dilated on 2017 TEE. Valve looked ok on  9/21 echo.  3. Chronic diastolic CHF: Restrictive cardiomyopathy.  Most recent echo in 9/21 shows normal LV systolic function, mildly dilated RV with mildly decreased systolic function, mild-moderate MS, normal bioprosthetic AoV, no evidence for pericardial constriction. RHC in 8/20 was  concerning for right > left heart failure, she did appear to have equalization of diastolic pressures concerning for restrictive cardiomyopathy, possibly related to prior radiation. CPX in 11/20 showed severe HF limitation.  Balance of renal failure and RV failure has been difficult.  Creatinine 2 most recently.  She is mildly volume overloaded on exam today with weight up.  NYHA class II-III.   - Increase torsemide to 60 daily alternating with 40 daily.  BMET today and in 10 days.  - Continue spironolactone 12.5 mg daily.  - I had her evaluated at [facility] for heart/kidney transplant for CKD and restrictive cardiomyopathy, but extensive vascular calcification makes her a poor surgical candidate.  - No Jardiance given use of insulin pump.   4. CKD: Patient has a single kidney s/p right nephrectomy and suspected diabetic nephropathy. CKD stage 4, slowly progressive.  Creatinine 2.0 last check. I have been concerned for cardiorenal syndrome in the setting of RV > LV failure/restrictive cardiomyopathy.   - Not candidate for PD given prior abdominal surgery.  - If CKD progresses, she may be a candidate for home HD.  5. Hyperlipidemia: She has not been able to tolerate any higher potency statin than pravastatin 10 mg daily.  Good lipids in 3/22.  - Continue pravastatin.  - Continue Repatha.   - She is on Vascepa for elevated TGs.  6. Anemia: Chronic/slow GI bleed. Unremarkable EGD and capsule endoscopy in fall 2014. Unfortunately, needs to stay on Brilinta long-term (Plavix non-responder).  7. Atrial fibrillation: Paroxysmal, very brief episodes noted on PCM interrogation. Given GI bleeding and need to be on Brilinta, risks have been thought to outweigh benefits of anticoagulation. She is not a good Multaq candidate with CHF. If she needs an antiarrhythmic, consider Tikosyn or Sotalol but renal function may make these meds difficult to use.  She has occasional palpitations but nothing prolonged.   - We  were unable to place Watchman device due to nickel allergy.  8. Carotid bruit: Only mild stenosis on last dopplers in 3/18.  9. SSS: Pacemaker in place.  10. HTN: controlled. Continue current regimen.  11. OSA: Moderate, using Bipap.   12. ?TIA: Transient dysarthria in 3/18, could have been afib-related TIA. No recurrent symptoms.  13. Thrombocytopenia: Chronic and mild. Follows with hematology.  14. 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  Based on 8/20 RHC and 9/21 echo it appears mild-moderate.  She would not be a candidate for valve replacement.   Followup in 3 months.   Signed, [name], MD  11/24/2020  [facility] [address] [facility] [place] [address] [phone] (office) [phone] (fax)

## 2020-11-24 NOTE — Patient Instructions (Signed)
EKG done today.  Labs done today. We will contact you only if your labs are abnormal.  INCREASE Torsemide to 62m (3 tablets) by mouth daily alternating with 446m(2 tablets) by mouth daily.   No other medication changes were made. Please continue all current medications as prescribed.  Your physician recommends that you schedule a follow-up appointment in: 10 days for a lab only appointment and in 3 months with Dr. McAundra Dubin If you have any questions or concerns before your next appointment please send usKorea message through [facility] call our office at [phone]   TO LEAVE A MESSAGE FOR THE NURSE SELECT OPTION 2, PLEASE LEAVE A MESSAGE INCLUDING: . YOUR NAME . DATE OF BIRTH . CALL BACK NUMBER . REASON FOR CALL**this is important as we prioritize the call backs  YOU WILL RECEIVE A CALL BACK THE SAME DAY AS LONG AS YOU CALL BEFORE 4:00 PM   Do the following things EVERYDAY: 1) Weigh yourself in the morning before breakfast. Write it down and keep it in a log. 2) Take your medicines as prescribed 3) Eat low salt foods--Limit salt (sodium) to 2000 mg per day.  4) Stay as active as you can everyday 5) Limit all fluids for the day to less than 2 liters   At [facility] and your health needs are our priority. As part of our continuing mission to provide you with exceptional heart care, we have created designated Provider Care Teams. These Care Teams include your primary Cardiologist (physician) and Advanced Practice Providers (APPs- Physician Assistants and Nurse Practitioners) who all work together to provide you with the care you need, when you need it.   You may see any of the following providers on your designated Care Team at your next follow up: . Marland Kitchenr DaGlori Bickers Dr DaLoralie Champagne AmDarrick GrinderNP . BrLyda JesterPA . LaAudry RilesPharmD   Please be sure to bring in all your medications bottles to every appointment.

## 2020-11-29 DIAGNOSIS — Z7189 Other specified counseling: Secondary | ICD-10-CM | POA: Diagnosis not present

## 2020-11-29 DIAGNOSIS — Z9641 Presence of insulin pump (external) (internal): Secondary | ICD-10-CM | POA: Diagnosis not present

## 2020-11-29 DIAGNOSIS — E109 Type 1 diabetes mellitus without complications: Secondary | ICD-10-CM | POA: Diagnosis not present

## 2020-12-05 ENCOUNTER — Ambulatory Visit (HOSPITAL_COMMUNITY)
Admission: RE | Admit: 2020-12-05 | Discharge: 2020-12-05 | Disposition: A | Discharge status: Home / Self Care | Payer: Medicare PPO | Source: Ambulatory Visit | Attending: Cardiology | Admitting: Cardiology

## 2020-12-05 ENCOUNTER — Other Ambulatory Visit: Payer: Self-pay

## 2020-12-05 DIAGNOSIS — I5032 Chronic diastolic (congestive) heart failure: Secondary | ICD-10-CM | POA: Diagnosis not present

## 2020-12-05 LAB — BASIC METABOLIC PANEL
Anion gap: 8 (ref 5–15)
BUN: 67 mg/dL — ABNORMAL HIGH (ref 8–23)
CO2: 30 mmol/L (ref 22–32)
Calcium: 10.4 mg/dL — ABNORMAL HIGH (ref 8.9–10.3)
Chloride: 102 mmol/L (ref 98–111)
Creatinine, Ser: 2.21 mg/dL — ABNORMAL HIGH (ref 0.44–1.00)
GFR, Estimated: 24 mL/min — ABNORMAL LOW (ref >60)
Glucose, Bld: 210 mg/dL — ABNORMAL HIGH (ref 70–99)
Potassium: 4.4 mmol/L (ref 3.5–5.1)
Sodium: 140 mmol/L (ref 135–145)

## 2020-12-08 ENCOUNTER — Encounter (HOSPITAL_COMMUNITY): Payer: Self-pay

## 2020-12-09 DIAGNOSIS — L03032 Cellulitis of left toe: Secondary | ICD-10-CM | POA: Diagnosis not present

## 2020-12-13 ENCOUNTER — Other Ambulatory Visit (HOSPITAL_COMMUNITY): Payer: Self-pay | Admitting: Cardiology

## 2020-12-13 DIAGNOSIS — I509 Heart failure, unspecified: Secondary | ICD-10-CM | POA: Diagnosis not present

## 2020-12-13 DIAGNOSIS — E1122 Type 2 diabetes mellitus with diabetic chronic kidney disease: Secondary | ICD-10-CM | POA: Diagnosis not present

## 2020-12-13 DIAGNOSIS — N184 Chronic kidney disease, stage 4 (severe): Secondary | ICD-10-CM | POA: Diagnosis not present

## 2020-12-13 DIAGNOSIS — K7581 Nonalcoholic steatohepatitis (NASH): Secondary | ICD-10-CM | POA: Diagnosis not present

## 2020-12-13 DIAGNOSIS — I6523 Occlusion and stenosis of bilateral carotid arteries: Secondary | ICD-10-CM

## 2020-12-13 DIAGNOSIS — K7460 Unspecified cirrhosis of liver: Secondary | ICD-10-CM | POA: Diagnosis not present

## 2020-12-17 DIAGNOSIS — Z20828 Contact with and (suspected) exposure to other viral communicable diseases: Secondary | ICD-10-CM | POA: Diagnosis not present

## 2020-12-17 DIAGNOSIS — R0981 Nasal congestion: Secondary | ICD-10-CM | POA: Diagnosis not present

## 2020-12-18 DIAGNOSIS — R519 Headache, unspecified: Secondary | ICD-10-CM | POA: Diagnosis not present

## 2020-12-18 DIAGNOSIS — Z20822 Contact with and (suspected) exposure to covid-19: Secondary | ICD-10-CM | POA: Diagnosis not present

## 2020-12-18 DIAGNOSIS — R5383 Other fatigue: Secondary | ICD-10-CM | POA: Diagnosis not present

## 2020-12-18 DIAGNOSIS — Z03818 Encounter for observation for suspected exposure to other biological agents ruled out: Secondary | ICD-10-CM | POA: Diagnosis not present

## 2020-12-18 DIAGNOSIS — J069 Acute upper respiratory infection, unspecified: Secondary | ICD-10-CM | POA: Diagnosis not present

## 2020-12-20 DIAGNOSIS — Z20822 Contact with and (suspected) exposure to covid-19: Secondary | ICD-10-CM | POA: Diagnosis not present

## 2020-12-21 ENCOUNTER — Other Ambulatory Visit (HOSPITAL_COMMUNITY): Payer: Self-pay | Admitting: Cardiology

## 2020-12-21 ENCOUNTER — Other Ambulatory Visit: Payer: Self-pay | Admitting: Cardiology

## 2020-12-22 DIAGNOSIS — K1370 Unspecified lesions of oral mucosa: Secondary | ICD-10-CM | POA: Diagnosis not present

## 2020-12-23 DIAGNOSIS — E1021 Type 1 diabetes mellitus with diabetic nephropathy: Secondary | ICD-10-CM | POA: Diagnosis not present

## 2020-12-26 ENCOUNTER — Other Ambulatory Visit: Payer: Self-pay | Admitting: Cardiology

## 2021-01-03 ENCOUNTER — Ambulatory Visit (INDEPENDENT_AMBULATORY_CARE_PROVIDER_SITE_OTHER): Payer: Medicare PPO

## 2021-01-03 DIAGNOSIS — I495 Sick sinus syndrome: Secondary | ICD-10-CM

## 2021-01-03 LAB — CUP PACEART REMOTE DEVICE CHECK
Battery Impedance: 683 Ohm
Battery Remaining Longevity: 87 mo
Battery Voltage: 2.79 V
Brady Statistic AP VP Percent: 0 %
Brady Statistic AP VS Percent: 0 %
Brady Statistic AS VP Percent: 0 %
Brady Statistic AS VS Percent: 99 %
Date Time Interrogation Session: 20220614132611
Implantable Lead Implant Date: 20140130
Implantable Lead Implant Date: 20140130
Implantable Lead Location: 753859
Implantable Lead Location: 753860
Implantable Lead Model: 5076
Implantable Lead Model: 5076
Implantable Pulse Generator Implant Date: 20140130
Lead Channel Impedance Value: 502 Ohm
Lead Channel Impedance Value: 633 Ohm
Lead Channel Pacing Threshold Amplitude: 0.5 V
Lead Channel Pacing Threshold Amplitude: 0.75 V
Lead Channel Pacing Threshold Pulse Width: 0.4 ms
Lead Channel Pacing Threshold Pulse Width: 0.4 ms
Lead Channel Setting Pacing Amplitude: 1.5 V
Lead Channel Setting Pacing Amplitude: 2.5 V
Lead Channel Setting Pacing Pulse Width: 0.4 ms
Lead Channel Setting Sensing Sensitivity: 5.6 mV

## 2021-01-04 DIAGNOSIS — J90 Pleural effusion, not elsewhere classified: Secondary | ICD-10-CM | POA: Diagnosis not present

## 2021-01-04 DIAGNOSIS — Z9641 Presence of insulin pump (external) (internal): Secondary | ICD-10-CM | POA: Diagnosis not present

## 2021-01-04 DIAGNOSIS — E1022 Type 1 diabetes mellitus with diabetic chronic kidney disease: Secondary | ICD-10-CM | POA: Diagnosis not present

## 2021-01-04 DIAGNOSIS — K3184 Gastroparesis: Secondary | ICD-10-CM | POA: Diagnosis not present

## 2021-01-04 DIAGNOSIS — E1043 Type 1 diabetes mellitus with diabetic autonomic (poly)neuropathy: Secondary | ICD-10-CM | POA: Diagnosis not present

## 2021-01-04 DIAGNOSIS — R918 Other nonspecific abnormal finding of lung field: Secondary | ICD-10-CM | POA: Diagnosis not present

## 2021-01-04 DIAGNOSIS — E1021 Type 1 diabetes mellitus with diabetic nephropathy: Secondary | ICD-10-CM | POA: Diagnosis not present

## 2021-01-04 DIAGNOSIS — I130 Hypertensive heart and chronic kidney disease with heart failure and stage 1 through stage 4 chronic kidney disease, or unspecified chronic kidney disease: Secondary | ICD-10-CM | POA: Diagnosis not present

## 2021-01-04 DIAGNOSIS — N184 Chronic kidney disease, stage 4 (severe): Secondary | ICD-10-CM | POA: Diagnosis not present

## 2021-01-04 DIAGNOSIS — I2510 Atherosclerotic heart disease of native coronary artery without angina pectoris: Secondary | ICD-10-CM | POA: Diagnosis not present

## 2021-01-04 DIAGNOSIS — R911 Solitary pulmonary nodule: Secondary | ICD-10-CM | POA: Diagnosis not present

## 2021-01-06 ENCOUNTER — Other Ambulatory Visit: Payer: Self-pay

## 2021-01-06 ENCOUNTER — Telehealth (INDEPENDENT_AMBULATORY_CARE_PROVIDER_SITE_OTHER): Payer: Medicare PPO | Admitting: Cardiology

## 2021-01-06 ENCOUNTER — Encounter: Payer: Self-pay | Admitting: Cardiology

## 2021-01-06 VITALS — BP 120/76 | HR 67 | Ht 62.5 in | Wt 113.0 lb

## 2021-01-06 DIAGNOSIS — G2581 Restless legs syndrome: Secondary | ICD-10-CM | POA: Diagnosis not present

## 2021-01-06 DIAGNOSIS — E1021 Type 1 diabetes mellitus with diabetic nephropathy: Secondary | ICD-10-CM | POA: Diagnosis not present

## 2021-01-06 DIAGNOSIS — I10 Essential (primary) hypertension: Secondary | ICD-10-CM | POA: Diagnosis not present

## 2021-01-06 DIAGNOSIS — Z9989 Dependence on other enabling machines and devices: Secondary | ICD-10-CM

## 2021-01-06 DIAGNOSIS — R262 Difficulty in walking, not elsewhere classified: Secondary | ICD-10-CM

## 2021-01-06 DIAGNOSIS — G4733 Obstructive sleep apnea (adult) (pediatric): Secondary | ICD-10-CM | POA: Diagnosis not present

## 2021-01-06 NOTE — Progress Notes (Signed)
Virtual Visit via Video Note   This visit type was conducted due to national recommendations for restrictions regarding the COVID-19 Pandemic (e.g. social distancing) in an effort to limit this patient's exposure and mitigate transmission in our community.  Due to her co-morbid illnesses, this patient is at least at moderate risk for complications without adequate follow up.  This format is felt to be most appropriate for this patient at this time.   Evaluation Performed:  Follow-up visit  Date:  01/06/2021   ID:  [name], [name] 1951-07-03, MRN [id number]  Patient Location:  Home  Provider location:   [place]  PCP:  [name], MD  Cardiologist: [name], MD Sleep Medicine:  Fransico Him, MD Electrophysiologist:  [name], MD   Chief Complaint:  OSA  History of Present Illness:    [name] [name] is a 70 y.o. female who presents via audio/video conferencing for a telehealth visit today.    [name] [name] is a 70y.o. female with a hx of chronic diastolic CHF, CAD and afib who was referred for a sleep study by Dr. Aundra Dubin due to excessive daytime sleepiness.  She was found to have moderate OSA with an AHI of 22/hr with significant oxygen desaturations as low as 85%.  She underwent CPAP titration to 18cm H2O but did not tolerate the high pressure and was subsequently placed on auto Pap from 6 to 14 cm H2O and is now on BiPAP.  She has had problems with  restless legs and I started her on Requip 0.45m nightly.  Ferritin was normal.    She is doing well with her CPAP device and thinks that she has gotten used to it.  She tolerates the mask and feels the pressure is adequate.  Since going on CPAP she feels rested in the am and has no significant daytime sleepiness.  She denies any significant mouth or nasal dryness or nasal congestion.  She does not think that he snores.     She tells me that the Requip was doing fairly well but recently she started having more  problems with it not working.  She was having problems with severe restless legs when riding in a car for several hours at a time and was also experiencing this in her arms and upper body as well.  Her PCP upped her to 0.559mPRN which has seemed to help some but after about a week of taking the higher dose she could not function in the am.  She decreased her does back down to 0.2523mt night but is still having problems with feeling sleepy so she completely stopped it and now her legs are starting to give out on her and intermittently cannot walk in a straight line. She also is dropping things and her sleepiness has not improved.   The patient does not have symptoms concerning for COVID-19 infection (fever, chills, cough, or new shortness of breath).   Prior CV studies:   The following studies were reviewed today:  PAP compliance download  Past Medical History:  Diagnosis Date   Anxiety    Aortic stenosis    a. bicuspid aortic valve; mean gradient of 20 mmHg in 2/10; b. s/p bioprosth AVR (19-mm Edwards pericardial valve) with a redo coronary artery bypass graft procedure in 07/2009; postoperative Dressler's syndrome     Arthritis    Breast cancer ([facility]  Carcinoma, renal cell ([facility]CC[place]1/2008   Laparoscopic right nephrectomy   Chromophilic renal cell  carcinoma (HCC)    Chronic diastolic CHF (congestive heart failure) ([place])    a. 9.2014 EchoP EF 60-65%, no rwma, bioprosth AVR, mean gradient of 75mHg, mild to mod MR, PASP 562mg.   CKD (chronic kidney disease), stage II    Colitis, ischemic (HCK[place]2008   Coronary artery disease    a. initial CABG with SVG-RCA in 12/05. b. redo SVG-RCA and bioprosthetic AVR in 1/11. d. 9/14 - PCI with DES to distal LM/proximal LAD was done rather than bypass as she would have been a poor candidate for redo sternotomy. d. S/p DES to prox LM 05/2014.   Diabetes mellitus 03/2010    TYPE II: Hemoglobin A1c of 7.4 in  03/2010; 8.4 in 06/2010; treated with insulin pump    Duodenal ulcer    Remote; H. pylori positive   Gastroparesis due to DM (HCM[place]10/04/2012   Genetic testing 01/14/2017   [name] underwent genetic counseling and testing for hereditary cancer syndromes on 01/01/2017. Her results were negative for mutations in all 83 genes analyzed by [facility]'s 83-gene Common Hereditary Cancers Panel. Genes analyzed include: ALK, APC, ATM, AXIN2, BAP1, BARD1, BLM, BMPR1A, BRCA1, BRCA2, BRIP1, CASR, CDC73, CDH1, CDK4, CDKN1B, CDKN1C, CDKN2A, CEBPA, CHEK2, CTNNA1, DICER1, DIS3L2,    Hodgkin's disease (HCChalkhill1991   Mantle radiation therapy   Hodgkin's disease (HCC[place]   Hyperlipidemia    Hyperplastic gastric polyp 06/23/2012   Hypertension    Iron deficiency anemia    a. 03/2013 EGD: essentially normal. Possible slow GIB.   Migraines    NASH (nonalcoholic steatohepatitis) 1999   -biopsy in 1999   OSA on CPAP 04/24/2017   Moderate with AHI 22/hr now on CPAP at 18cm H2o   Osteopenia     hip on DEXA in October 2007.   Osteoporosis    Oxygen deficiency    Pacemaker    six sinus syndrome   PAF (paroxysmal atrial fibrillation) (H[facility]   a.  h/o brief episodes noted on ppm interrogation. b. given GI bleeding and need to be on both ASA 81 and Brilinta, risks likely outweigh benefits of anticoagulation.    Renal insufficiency    Small bowel obstruction (HCC)    Recurrent; resolved after resection of a lipoma   Syncope    a. H/o recurrent syncope with pauses on loop recorder s/p Medtronic pacemaker 07/2012.   Past Surgical History:  Procedure Laterality Date   ABDOMINAL HYSTERECTOMY     AORTIC VALVE REPLACEMENT  2011   Aortic valve replacement surgery with a 19 mm bioprosthetic device post redo CABG January 2011.   BONE MARROW BIOPSY     BREAST EXCISIONAL BIOPSY     benign, bilateral   CARDIAC CATHETERIZATION     CERVICAL BIOPSY     COLONOSCOPY     Multiple with adenomatous polyps   COLONOSCOPY  06/23/2012   Procedure: COLONOSCOPY;  Surgeon: CaGatha MayerMD;  [facility]: [facility];  Service: Endoscopy;  Laterality: N/A;   CORONARY ARTERY BYPASS GRAFT  2005, 2011   CABG-most recent SVG to RCA-12/05; RCA occluded with patent graft in 2006; Redo bypass in 07/2009   ESOPHAGOGASTRODUODENOSCOPY     ESOPHAGOGASTRODUODENOSCOPY  06/23/2012   Procedure: ESOPHAGOGASTRODUODENOSCOPY (EGD);  Surgeon: CaGatha MayerMD;  [facility];  Service: Endoscopy;  Laterality: N/A;   ESOPHAGOGASTRODUODENOSCOPY N/A 04/06/2013   Procedure: ESOPHAGOGASTRODUODENOSCOPY (EGD);  Surgeon: JoIrene ShipperMD;  [facility];  Service: Endoscopy;  Laterality: N/A;   EXPLORATORY LAPAROTOMY W/  BOWEL RESECTION     Small bowel resection of lipoma   GIVENS CAPSULE STUDY N/A 04/30/2013   Procedure: GIVENS CAPSULE STUDY;  Surgeon: Gatha Mayer, MD;  Location: [place];  Service: Endoscopy;  Laterality: N/A;   LEFT AND RIGHT HEART CATHETERIZATION WITH CORONARY ANGIOGRAM N/A 04/07/2013   Procedure: LEFT AND RIGHT HEART CATHETERIZATION WITH CORONARY ANGIOGRAM;  Surgeon: Larey Dresser, MD;  Location: [facility] CATH LAB;  Service: Cardiovascular;  Laterality: N/A;   LEFT ATRIAL APPENDAGE OCCLUSION N/A 02/16/2016   Watchman not placed - nickel allergy   LEFT HEART CATHETERIZATION WITH CORONARY/GRAFT ANGIOGRAM N/A 06/07/2014   Procedure: LEFT HEART CATHETERIZATION WITH Beatrix Fetters;  Surgeon: Burnell Blanks, MD;  [facility];  Service: Cardiovascular;  Laterality: N/A;   LIVER BIOPSY     LOOP RECORDER IMPLANT N/A 12/10/2011   Procedure: LOOP RECORDER IMPLANT;  Surgeon: Evans Lance, MD;  Location[facility] LAB;  Service: Cardiovascular;  Laterality: N/A;   MALONEY DILATION  06/23/2012   Procedure: [name] DILATION;  Surgeon: Gatha Mayer, MD;  Location: [facility];  Service: Endoscopy;;  80 fr   MASTECTOMY Left 05/17/2015   total    NEPHRECTOMY  2008   Laparoscopic right nephrectomy, renal cell carcinoma   PACEMAKER INSERTION   08/21/2012   Medtronic Adapta L dual-chamber pacemaker   PERCUTANEOUS CORONARY STENT INTERVENTION (PCI-S) N/A 04/09/2013   Procedure: PERCUTANEOUS CORONARY STENT INTERVENTION (PCI-S);  Surgeon: Burnell Blanks, MD;  Location[facility] LAB;  Service: Cardiovascular;  Laterality: N/A;   PERMANENT PACEMAKER INSERTION N/A 08/21/2012   Procedure: PERMANENT PACEMAKER INSERTION;  Surgeon: Evans Lance, MD;  Location[facility] CATH LAB;  Service: Cardiovascular;  Laterality: N/A;   RIGHT HEART CATH N/A 03/18/2019   Procedure: RIGHT HEART CATH;  Surgeon: Larey Dresser, MD;  Location: [place] CV LAB;  Service: Cardiovascular;  Laterality: N/A;   RIGHT HEART CATH AND CORONARY/GRAFT ANGIOGRAPHY N/A 04/19/2017   Procedure: RIGHT HEART CATH AND CORONARY/GRAFT ANGIOGRAPHY;  Surgeon: Larey Dresser, MD;  Location: [place] CV LAB;  Service: Cardiovascular;  Laterality: N/A;   TEE WITHOUT CARDIOVERSION N/A 12/07/2015   Procedure: TRANSESOPHAGEAL ECHOCARDIOGRAM (TEE);  Surgeon: Larey Dresser, MD;  Location: [place];  Service: Cardiovascular;  Laterality: N/A;   TOTAL ABDOMINAL HYSTERECTOMY W/ BILATERAL SALPINGOOPHORECTOMY  2000   endometriosis and ovarian cysts   TOTAL MASTECTOMY Left 05/17/2015   Procedure: LEFT TOTAL MASTECTOMY;  Surgeon: Rolm Bookbinder, MD;  Location: [place];  Service: General;  Laterality: Left;   TUMOR EXCISION  1981   removal of Hodgkins lymphoma     Current Meds  Medication Sig   acyclovir ointment (ZOVIRAX) 5 % Apply 1 application topically every 3 (three) hours as needed (fever blister/cold sores).   ALPRAZolam (XANAX) 0.5 MG tablet Take 1 tablet (0.5 mg total) by mouth 2 (two) times daily as needed for anxiety.   APIDRA 100 UNIT/ML injection Inject 40 Units into the skin as directed. INFUSE THROUGH INSULIN PUMP UTD, Bolus depends on carb intake   aspirin EC 81 MG tablet Take 81 mg by mouth at bedtime.   BRILINTA 60 MG TABS tablet TAKE ONE TABLET (60MG TOTAL) BY  MOUTH TWICE DAILY   cetirizine (ZYRTEC) 10 MG tablet Take 10 mg by mouth daily.   clobetasol (OLUX) 0.05 % topical foam Apply 1 application topically 2 (two) times daily as needed (skin flaking).   Coenzyme Q10 (CO Q 10) 100 MG CAPS Take 100 mg  by mouth at bedtime.    colchicine 0.6 MG tablet Take 0.6 mg by mouth daily as needed (gout flare).   CONTOUR NEXT TEST test strip USE TO TEST BLOOD SUGAR UP TO EIGHT TIMES A DAY   denosumab (PROLIA) 60 MG/ML SOSY injection Inject 60 mg into the skin every 6 (six) months.   diclofenac Sodium (VOLTAREN) 1 % GEL as needed.   docusate sodium (COLACE) 100 MG capsule Take 100 mg by mouth 2 (two) times daily.   famotidine (PEPCID) 40 MG tablet Take 40 mg by mouth daily.   glucagon (GLUCAGON EMERGENCY) 1 MG injection Inject 1 mg into the vein once as needed (low blood sugar).    isosorbide mononitrate (IMDUR) 120 MG 24 hr tablet TAKE ONE TABLET (120MG TOTAL) BY MOUTH DAILY   latanoprost (XALATAN) 0.005 % ophthalmic solution Place 1 drop into both eyes at bedtime.   metolazone (ZAROXOLYN) 2.5 MG tablet Take 2.5 mg (1 tab) once as directed by CHF clinic. Call before taking [phone]   metoprolol tartrate (LOPRESSOR) 50 MG tablet Take 50 mg by mouth 2 (two) times daily.   mometasone (NASONEX) 50 MCG/ACT nasal spray Place into the nose.   mupirocin cream (BACTROBAN) 2 % Apply 1 application topically 2 (two) times daily as needed.   nitroGLYCERIN (NITROLINGUAL) 0.4 MG/SPRAY spray PLACE 1 SPRAY UNDER THE TONGUE EVERY 5 MINUTES FOR 3 DOSES AS NEEDED FOR CHEST PAIN.   polyethylene glycol (MIRALAX / GLYCOLAX) 17 g packet Take 17 g by mouth daily.   pravastatin (PRAVACHOL) 10 MG tablet TAKE ONE TABLET (10 MG TOTAL) BY MOUTH DAILY.   REPATHA SURECLICK 151 MG/ML SOAJ INJECT 140MG INTO THE SKIN EVERY 14 DAYS   silver nitrate applicators 76-16 % applicator Use as needed to stop superficial skin bleeding.   spironolactone (ALDACTONE) 25 MG tablet Take 12.5 mg by mouth  daily.   torsemide (DEMADEX) 20 MG tablet Take 24m (3 tablets) by mouth daily alternating with 421m(2 tablets) daily   ULORIC 40 MG tablet Take 1 tablet (40 mg total) by mouth at bedtime.   UNABLE TO FIND Med Name: So Clean CPAP cleaning system   VASCEPA 1 g capsule TAKE 2 CAPSULES BY MOUTH TWICE A DAY   Wheat Dextrin (BENEFIBER) POWD Take 1 Dose by mouth daily.    [DISCONTINUED] metoprolol tartrate (LOPRESSOR) 50 MG tablet TAKE ONE AND ONE-HALF TABLETS (75MG TOTAL) BY MOUTH TWICE DAILY     Allergies:   Avandia [rosiglitazone maleate], Cephalexin, Clindamycin, Clindamycin hcl, Humalog [insulin lispro], Lincomycin, Metformin, Novolog [insulin aspart], Penicillins, Rosiglitazone, Tizanidine, Adhesive [tape], Atorvastatin, Cholestyramine, Gentamycin [gentamicin], Iodinated diagnostic agents, Limonene, Pravastatin, Prednisone, Ranexa [ranolazine], Rosuvastatin, Simvastatin, Strawberry extract, Azelastine, Clopidogrel, Lisinopril, Oseltamivir phosphate, Tizanidine hcl, Codeine, Erythromycin, Hydrocodone-acetaminophen, Latex, Neomycin, Nickel, Ranitidine, Tamiflu [oseltamivir], and Tramadol   Social History   Tobacco Use   Smoking status: Never   Smokeless tobacco: Never  Vaping Use   Vaping Use: Never used  Substance Use Topics   Alcohol use: No    Alcohol/week: 0.0 standard drinks   Drug use: No     Family Hx: The patient's family history includes Breast cancer (age of onset: 4114in her sister; Cancer (age of onset: 3046in her sister; Cancer (age of onset: 5044in her maternal grandfather; Diabetes in her father; Glaucoma in her brother; Heart attack in her father, maternal grandmother, and mother; Heart disease in her father and mother; Hypertension in her brother; Lung cancer in her sister. There is no history of  Colon cancer or Stomach cancer.  ROS:   Please see the history of present illness.     All other systems reviewed and are negative.   Labs/Other Tests and Data Reviewed:     Recent Labs: 10/31/2020: ALT 18; Hemoglobin 12.3; Platelets 115 12/05/2020: BUN 67; Creatinine, Ser 2.21; Potassium 4.4; Sodium 140   Recent Lipid Panel Lab Results  Component Value Date/Time   CHOL 109 04/20/2020 09:50 AM   TRIG 209 (H) 04/20/2020 09:50 AM   HDL 33 (L) 04/20/2020 09:50 AM   CHOLHDL 3.3 04/20/2020 09:50 AM   LDLCALC 34 04/20/2020 09:50 AM   LDLDIRECT 43.0 12/14/2014 09:38 AM    Wt Readings from Last 3 Encounters:  01/06/21 113 lb (51.3 kg)  11/24/20 120 lb 9.6 oz (54.7 kg)  11/07/20 119 lb (54 kg)     Objective:    Vital Signs:  BP 120/76   Pulse 67   Ht 5' 2.5" (1.588 m)   Wt 113 lb (51.3 kg)   SpO2 95%   BMI 20.34 kg/m   Well nourished, well developed female in no acute distress. Well appearing, alert and conversant, regular work of breathing,  good skin color  Eyes- anicteric mouth- oral mucosa is pink  neuro- grossly intact skin- no apparent rash or lesions or cyanosis   ASSESSMENT & PLAN:    1. OSA - The patient is tolerating PAP therapy well without any problems. The PAP download performed by his DME was personally reviewed and interpreted by me today and showed an AHI of 2.9/hr on auto PAP cm H2O with 90% compliance in using more than 4 hours nightly.  The patient has been using and benefiting from PAP use and will continue to benefit from therapy.  -she has a lot of problems with mouth dryness but cannot up titrate the humidity further due to condensation in her  tubing. The dry mouth continues but unfortunately not much to offer>>this does not seem to interfere with her use of the CPAP -she uses Biotene mouth wash  2.  HTN -BP is well controlled on exam today -Continue prescription drug management with Lopressor 70m BID, Imdur 1216mdaily and spiro 2556maily   3.  Restless Legs syndrome -this had been stable on Requip but recently started having increased problems with not only lower body as well as upper body severe restlessness.  This  did better after PCP increased Requip to 0.5mg61mN but she had severe sleepiness and had  to cut back to 0.25mg86m -due to the sleepiness not improving back on the lower dose she stopped her requip 3 weeks ago and over the past week she is now having problems with dropping things, not being able to walk, sleeping all day and legs giving out as well as worsening of full body restlessness -I think she needs to be seen by Neurology ASAP either today or Monday >> she is going  -I have recommended that she go back on the 0.25mg 45mtly for now until seen by Neurology and told her that this is not a drug she can stop cold turkeyKuwaitD-19 Education: The signs and symptoms of COVID-19 were discussed with the patient and how to seek care for testing (follow up with PCP or arrange E-visit).  The importance of social distancing was discussed today.  Patient Risk:   After full review of this patient's clinical status, I feel that they are at least moderate risk at this time.  Time:  Today, I have spent 25 minutes on telemedicine  with 20 minutes spent discussing medical problems including OSA< HTN, RLS and reviewing patient's chart including PAP compliance download.  Medication Adjustments/Labs and Tests Ordered: Current medicines are reviewed at length with the patient today.  Concerns regarding medicines are outlined above.  Tests Ordered: No orders of the defined types were placed in this encounter.  Medication Changes: No orders of the defined types were placed in this encounter.   Disposition:  Follow up in 6 months   Signed, Fransico Him, MD  01/06/2021 10:13 AM    [place]

## 2021-01-06 NOTE — Addendum Note (Signed)
Addended by: Mendel Ryder on: 01/06/2021 10:39 AM   Modules accepted: Orders

## 2021-01-06 NOTE — Patient Instructions (Signed)
Medication Instructions:  Your physician recommends that you continue on your current medications as directed. Please refer to the Current Medication list given to you today.  *If you need a refill on your cardiac medications before your next appointment, please call your pharmacy*   Lab Work: None ordered   If you have labs (blood work) drawn today and your tests are completely normal, you will receive your results only by: [place] (if you have MyChart) OR A paper copy in the mail If you have any lab test that is abnormal or we need to change your treatment, we will call you to review the results.   Testing/Procedures: None ordered    Follow-Up: At [facility], you and your health needs are our priority.  As part of our continuing mission to provide you with exceptional heart care, we have created designated Provider Care Teams.  These Care Teams include your primary Cardiologist (physician) and Advanced Practice Providers (APPs -  Physician Assistants and Nurse Practitioners) who all work together to provide you with the care you need, when you need it.  We recommend signing up for the patient portal called "MyChart".  Sign up information is provided on this After Visit Summary.  MyChart is used to connect with patients for Virtual Visits (Telemedicine).  Patients are able to view lab/test results, encounter notes, upcoming appointments, etc.  Non-urgent messages can be sent to your provider as well.   To learn more about what you can do with MyChart, go to [email or web address].    Your next appointment:   6 month(s)  The format for your next appointment:   In Person  Provider:   You may see Dr.Traci Turner or one of the following Advanced Practice Providers on your designated Care Team:   Melina Copa, PA-C Ermalinda Barrios, PA-C   Other Instructions Your physician has referred you to see [facility] Neurology.We will contact you with an appointment

## 2021-01-25 NOTE — Progress Notes (Signed)
Remote pacemaker transmission.   

## 2021-02-06 ENCOUNTER — Encounter (HOSPITAL_COMMUNITY): Payer: Self-pay | Admitting: *Deleted

## 2021-02-06 ENCOUNTER — Telehealth (HOSPITAL_COMMUNITY): Payer: Self-pay | Admitting: *Deleted

## 2021-02-06 NOTE — Telephone Encounter (Signed)
Mychart message sent to patient.

## 2021-02-06 NOTE — Telephone Encounter (Signed)
Pt left VM requesting Dr.McLean recommend a thoracic surgeon for her son.   Routed to [place]

## 2021-02-06 NOTE — Telephone Encounter (Signed)
What operation does he need and where does he live?

## 2021-02-22 ENCOUNTER — Other Ambulatory Visit (HOSPITAL_COMMUNITY): Payer: Self-pay | Admitting: Cardiology

## 2021-02-22 ENCOUNTER — Other Ambulatory Visit (HOSPITAL_COMMUNITY): Payer: Self-pay

## 2021-02-22 DIAGNOSIS — Z8719 Personal history of other diseases of the digestive system: Secondary | ICD-10-CM | POA: Diagnosis not present

## 2021-02-22 DIAGNOSIS — E1021 Type 1 diabetes mellitus with diabetic nephropathy: Secondary | ICD-10-CM | POA: Diagnosis not present

## 2021-02-22 DIAGNOSIS — R911 Solitary pulmonary nodule: Secondary | ICD-10-CM | POA: Diagnosis not present

## 2021-02-22 DIAGNOSIS — E041 Nontoxic single thyroid nodule: Secondary | ICD-10-CM | POA: Diagnosis not present

## 2021-02-22 DIAGNOSIS — I350 Nonrheumatic aortic (valve) stenosis: Secondary | ICD-10-CM | POA: Diagnosis not present

## 2021-02-22 DIAGNOSIS — E785 Hyperlipidemia, unspecified: Secondary | ICD-10-CM | POA: Diagnosis not present

## 2021-02-22 DIAGNOSIS — K7581 Nonalcoholic steatohepatitis (NASH): Secondary | ICD-10-CM | POA: Diagnosis not present

## 2021-02-22 DIAGNOSIS — K8020 Calculus of gallbladder without cholecystitis without obstruction: Secondary | ICD-10-CM | POA: Diagnosis not present

## 2021-02-22 DIAGNOSIS — I10 Essential (primary) hypertension: Secondary | ICD-10-CM | POA: Diagnosis not present

## 2021-02-22 DIAGNOSIS — I5030 Unspecified diastolic (congestive) heart failure: Secondary | ICD-10-CM | POA: Diagnosis not present

## 2021-02-22 DIAGNOSIS — E042 Nontoxic multinodular goiter: Secondary | ICD-10-CM | POA: Diagnosis not present

## 2021-02-22 DIAGNOSIS — K7460 Unspecified cirrhosis of liver: Secondary | ICD-10-CM | POA: Diagnosis not present

## 2021-02-22 DIAGNOSIS — K760 Fatty (change of) liver, not elsewhere classified: Secondary | ICD-10-CM | POA: Diagnosis not present

## 2021-02-22 MED ORDER — ICOSAPENT ETHYL 1 G PO CAPS: 2.0000 g | ORAL_CAPSULE | Freq: Two times a day (BID) | ORAL | 3 refills | Status: DC

## 2021-02-24 ENCOUNTER — Ambulatory Visit (HOSPITAL_COMMUNITY)
Admission: RE | Admit: 2021-02-24 | Discharge: 2021-02-24 | Disposition: A | Discharge status: Home / Self Care | Payer: Medicare PPO | Source: Ambulatory Visit | Attending: Cardiology | Admitting: Cardiology

## 2021-02-24 ENCOUNTER — Other Ambulatory Visit: Payer: Self-pay

## 2021-02-24 VITALS — BP 110/60 | HR 68 | Wt 115.8 lb

## 2021-02-24 DIAGNOSIS — Z7902 Long term (current) use of antithrombotics/antiplatelets: Secondary | ICD-10-CM | POA: Insufficient documentation

## 2021-02-24 DIAGNOSIS — Z7901 Long term (current) use of anticoagulants: Secondary | ICD-10-CM | POA: Diagnosis not present

## 2021-02-24 DIAGNOSIS — Z923 Personal history of irradiation: Secondary | ICD-10-CM | POA: Insufficient documentation

## 2021-02-24 DIAGNOSIS — Z881 Allergy status to other antibiotic agents status: Secondary | ICD-10-CM | POA: Diagnosis not present

## 2021-02-24 DIAGNOSIS — Q231 Congenital insufficiency of aortic valve: Secondary | ICD-10-CM | POA: Diagnosis not present

## 2021-02-24 DIAGNOSIS — E785 Hyperlipidemia, unspecified: Secondary | ICD-10-CM | POA: Insufficient documentation

## 2021-02-24 DIAGNOSIS — Z955 Presence of coronary angioplasty implant and graft: Secondary | ICD-10-CM | POA: Insufficient documentation

## 2021-02-24 DIAGNOSIS — Z794 Long term (current) use of insulin: Secondary | ICD-10-CM | POA: Diagnosis not present

## 2021-02-24 DIAGNOSIS — R002 Palpitations: Secondary | ICD-10-CM | POA: Insufficient documentation

## 2021-02-24 DIAGNOSIS — G459 Transient cerebral ischemic attack, unspecified: Secondary | ICD-10-CM | POA: Insufficient documentation

## 2021-02-24 DIAGNOSIS — R0989 Other specified symptoms and signs involving the circulatory and respiratory systems: Secondary | ICD-10-CM | POA: Insufficient documentation

## 2021-02-24 DIAGNOSIS — Z950 Presence of cardiac pacemaker: Secondary | ICD-10-CM | POA: Diagnosis not present

## 2021-02-24 DIAGNOSIS — Z9641 Presence of insulin pump (external) (internal): Secondary | ICD-10-CM | POA: Diagnosis not present

## 2021-02-24 DIAGNOSIS — I5032 Chronic diastolic (congestive) heart failure: Secondary | ICD-10-CM | POA: Diagnosis not present

## 2021-02-24 DIAGNOSIS — D509 Iron deficiency anemia, unspecified: Secondary | ICD-10-CM | POA: Insufficient documentation

## 2021-02-24 DIAGNOSIS — Z953 Presence of xenogenic heart valve: Secondary | ICD-10-CM | POA: Insufficient documentation

## 2021-02-24 DIAGNOSIS — I130 Hypertensive heart and chronic kidney disease with heart failure and stage 1 through stage 4 chronic kidney disease, or unspecified chronic kidney disease: Secondary | ICD-10-CM | POA: Diagnosis not present

## 2021-02-24 DIAGNOSIS — Z905 Acquired absence of kidney: Secondary | ICD-10-CM | POA: Insufficient documentation

## 2021-02-24 DIAGNOSIS — Z85528 Personal history of other malignant neoplasm of kidney: Secondary | ICD-10-CM | POA: Insufficient documentation

## 2021-02-24 DIAGNOSIS — I425 Other restrictive cardiomyopathy: Secondary | ICD-10-CM | POA: Insufficient documentation

## 2021-02-24 DIAGNOSIS — Z940 Kidney transplant status: Secondary | ICD-10-CM | POA: Insufficient documentation

## 2021-02-24 DIAGNOSIS — Z79899 Other long term (current) drug therapy: Secondary | ICD-10-CM | POA: Diagnosis not present

## 2021-02-24 DIAGNOSIS — Z91048 Other nonmedicinal substance allergy status: Secondary | ICD-10-CM | POA: Insufficient documentation

## 2021-02-24 DIAGNOSIS — E1022 Type 1 diabetes mellitus with diabetic chronic kidney disease: Secondary | ICD-10-CM | POA: Insufficient documentation

## 2021-02-24 DIAGNOSIS — D696 Thrombocytopenia, unspecified: Secondary | ICD-10-CM | POA: Insufficient documentation

## 2021-02-24 DIAGNOSIS — Z888 Allergy status to other drugs, medicaments and biological substances status: Secondary | ICD-10-CM | POA: Diagnosis not present

## 2021-02-24 DIAGNOSIS — I25110 Atherosclerotic heart disease of native coronary artery with unstable angina pectoris: Secondary | ICD-10-CM | POA: Diagnosis not present

## 2021-02-24 DIAGNOSIS — Z880 Allergy status to penicillin: Secondary | ICD-10-CM | POA: Diagnosis not present

## 2021-02-24 DIAGNOSIS — Z7982 Long term (current) use of aspirin: Secondary | ICD-10-CM | POA: Insufficient documentation

## 2021-02-24 DIAGNOSIS — G4733 Obstructive sleep apnea (adult) (pediatric): Secondary | ICD-10-CM | POA: Insufficient documentation

## 2021-02-24 DIAGNOSIS — Z833 Family history of diabetes mellitus: Secondary | ICD-10-CM | POA: Insufficient documentation

## 2021-02-24 DIAGNOSIS — I050 Rheumatic mitral stenosis: Secondary | ICD-10-CM | POA: Insufficient documentation

## 2021-02-24 DIAGNOSIS — N184 Chronic kidney disease, stage 4 (severe): Secondary | ICD-10-CM

## 2021-02-24 DIAGNOSIS — Z885 Allergy status to narcotic agent status: Secondary | ICD-10-CM | POA: Diagnosis not present

## 2021-02-24 DIAGNOSIS — I480 Paroxysmal atrial fibrillation: Secondary | ICD-10-CM | POA: Insufficient documentation

## 2021-02-24 DIAGNOSIS — Z951 Presence of aortocoronary bypass graft: Secondary | ICD-10-CM | POA: Diagnosis not present

## 2021-02-24 DIAGNOSIS — I495 Sick sinus syndrome: Secondary | ICD-10-CM | POA: Insufficient documentation

## 2021-02-24 DIAGNOSIS — Z8249 Family history of ischemic heart disease and other diseases of the circulatory system: Secondary | ICD-10-CM | POA: Insufficient documentation

## 2021-02-24 LAB — LIPID PANEL
Cholesterol: 107 mg/dL (ref 0–200)
HDL: 40 mg/dL — ABNORMAL LOW (ref >40)
LDL Cholesterol: 1 mg/dL (ref 0–99)
Total CHOL/HDL Ratio: 2.7 RATIO
Triglycerides: 330 mg/dL — ABNORMAL HIGH (ref <150)
VLDL: 66 mg/dL — ABNORMAL HIGH (ref 0–40)

## 2021-02-24 LAB — BASIC METABOLIC PANEL
Anion gap: 10 (ref 5–15)
BUN: 70 mg/dL — ABNORMAL HIGH (ref 8–23)
CO2: 24 mmol/L (ref 22–32)
Calcium: 9.8 mg/dL (ref 8.9–10.3)
Chloride: 100 mmol/L (ref 98–111)
Creatinine, Ser: 2.12 mg/dL — ABNORMAL HIGH (ref 0.44–1.00)
GFR, Estimated: 25 mL/min — ABNORMAL LOW (ref >60)
Glucose, Bld: 271 mg/dL — ABNORMAL HIGH (ref 70–99)
Potassium: 4.5 mmol/L (ref 3.5–5.1)
Sodium: 134 mmol/L — ABNORMAL LOW (ref 135–145)

## 2021-02-24 LAB — BRAIN NATRIURETIC PEPTIDE: B Natriuretic Peptide: 99.4 pg/mL (ref 0.0–100.0)

## 2021-02-24 NOTE — Patient Instructions (Signed)
EKG done today.  Labs done today. We will contact you only if your labs are abnormal.  No medication changes were made. Please continue all current medications as prescribed.  Your physician recommends that you schedule a follow-up appointment in: 3 months  If you have any questions or concerns before your next appointment please send [name] a message through [place] or call our office at [phone].    TO LEAVE A MESSAGE FOR THE NURSE SELECT OPTION 2, PLEASE LEAVE A MESSAGE INCLUDING: YOUR NAME DATE OF BIRTH CALL BACK NUMBER REASON FOR CALL**this is important as we prioritize the call backs  YOU WILL RECEIVE A CALL BACK THE SAME DAY AS LONG AS YOU CALL BEFORE 4:00 PM   Do the following things EVERYDAY: Weigh yourself in the morning before breakfast. Write it down and keep it in a log. Take your medicines as prescribed Eat low salt foods--Limit salt (sodium) to 2000 mg per day.  Stay as active as you can everyday Limit all fluids for the day to less than 2 liters   At the [facility], you and your health needs are our priority. As part of our continuing mission to provide you with exceptional heart care, we have created designated Provider Care Teams. These Care Teams include your primary Cardiologist (physician) and Advanced Practice Providers (APPs- Physician Assistants and Nurse Practitioners) who all work together to provide you with the care you need, when you need it.   You may see any of the following providers on your designated Care Team at your next follow up: Dr Glori Bickers Dr Haynes Kerns, NP Lyda Jester, [place] Audry Riles, PharmD   Please be sure to bring in all your medications bottles to every appointment.

## 2021-02-24 NOTE — Progress Notes (Signed)
Date:  02/24/2021   ID:  [name] [name], DOB 1951/06/19, MRN [id number]   Provider location: [place] Type of Visit: Established patient  PCP:  [name], MD  Cardiologist:  None Primary HF: Dr. Aundra Dubin   History of Present Illness: [name] [name] is a 70 y.o. female who has a history of CAD s/p CABG in 12/05 and redo in 1/11, bioprosthetic AVR, diastolic CHF, and sick sinus syndrome s/p PCM presents for cardiology followup.  She had initial CABG with SVG-RCA in 12/05 followed by redo SVG-RCA and bioprosthetic AVR in 1/11.  She has had diastolic CHF and is on Lasix.  In 9/14, she had a CPX test.  This showed severe functional limitation with ischemic ECG changes (inferolateral ST depression and ST elevation in V1 and V2). She had chest pain.  She had R/LHC in 9/14.  This showed elevated left and right heart filling pressures, patent SVG-PDA, and 80% distal LM/80% ostial LAD stenosis.  She was not a candidate for redo CABG (had 2 prior sternotomies as well as chest radiation for Hodgkins lymphoma).  She had DES from left main into proximal LAD and will need long-term DAPT => Brilinta was used as she has been a poor responder to Plavix in the past.  She re-developed angina in 11/15 and was admitted with unstable angina.  LHC showed 80% ostial LM and patent SVG-RCA. She had DES to ostial LM. Echo in 1/18 showed EF 60-65%, normal bioprosthetic aortic valve, mild-moderate MR, PASP 35 mmHg.    She has chronic iron deficiency anemia thought to be due to a slow GI bleed.  Last EGD was unremarkable and a capsule endoscopy was also unremarkable.  She has had periodic transfusions.     She had bilateral mastectomy for DCIS in 03/49 without complication.   She has an insulin pump followed by an endocrinologist at [facility].    Given myalgias with statins, she was started on Praluent.  LDL is now excellent.     In the past she has had short runs of atrial fibrillation.  She  is not anticoagulated. Planned for Watchman placement, but given her nickel allergy, we were unable to place a Watchman.      Diagnosed with OSA, now on CPAP.    Given ongoing problems with exertional dyspnea and occasional chest pain, Cardiolite was repeated in 9/18 and showed inferior changes.  She underwent left/right heart cath in 9/18.  This showed elevated filling pressures, R>L.  There was 75+% ostial LCx stenosis.  However, there were 2 layers of stent from the mid left main into the ostial LAD, and LCx intervention was thought to be very risky.  It was decided to manage her medically.  Echo was repeated in 10/18 showing EF 55-60%, moderate to severe TR.     Given recurrent chest pain, she had ETT-Cardiolite done in 3/19.  This was a normal study with no evidence for ischemia or infarction.  Echo in 1/20 showed EF 55-60%, mild LVH, stable bioprosthetic aortic valve, moderate-severe TR.    Recently, she has been more short of breath with exertion and renal function has been worse.  She was set up for echo and RHC.  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  RHC in  8/20 showed primarily right-sided HF.  Cardiolite in 8/20 showed no ischemia or infarction.  Torsemide was increased and she cut back on sodium in her diet.    CPX in 11/20 showed severe functional limitation from HF.   She saw Dr. Mosetta Pigeon at [facility] for evaluation for heart/kidney transplant (for restrictive cardiomyopathy).  She was determined to be a poor surgical candidate due to extensive vascular calcifications.   CT chest in 8/21 showed a loculated right pleural effusion but unable to find enough fluid by ultrasound for thoracentesis.   Echo in 9/21 showed EF 60-65% with mild LVH, mild RV dilation  with mildly decreased systolic function and a D-shaped septum, PASP 40, normal bioprosthetic aortic valve, rheumatic-appearing MV with mild-moderate mitral stenosis (mean gradient 9 but MVA by PHT 2.7 cm^2), trivial MR.    She returns today for followup of CHF and CAD.  Weight is down 5 lbs. She recently took at 6 week trip out [place].  She had a hard time with high elevations, noted shortness of breath and chest tightness with exertion in both [facility] and [facility].  She had to use NTG.  However, since returning home, she has had no chest pain.  She is not short of breath walking short distances around house, out to car, and around stores.  She does try to avoid the heat.  No orthopnea/PND.       ECG (personally reviewed): NSR, 1st degree AVB (222 msec), LVH   Labs (2/19): hgb 13.2 Labs @ [facility] (3/19): K 4.7, creatinine 1.4, ANA negative, RF negative Labs (4/19): K 3.9, creatinine 1.52 => 1.63 Labs (6/19): LDL 8, HDL 42, TGs 259 Labs (9/19): K 4.9, creatinine 1.65, hgb 13.4, plts 129, LDL 46,TGs 182 Labs (12/19): K 4.9, creatinine 2.07, hgb 12.4 Labs (3/20): K 4, creatinine 1.8 Labs (6/20): K 4.7, creatinine 1.47 Labs (8/20): creatinine 2.13 => 2.46 => 2.4, LDL 22 Labs (9/20): K 4.8, creatinine 2.74, AST/ALT normal, hgb 12.6 Labs (1/21): K 4.1, creatinine 1.86, hgb 11.7 Labs (4/21): K 4.7, creatinine 1.7 Labs (6/21): K 4.2, creatinine 2 => 1.7 Labs (8/21): K 3.8, creatinine 1.69, hgb 10.6, plts 116 Labs (9/21): LDL 34, TGs 209 Labs (1/22): K 3.8, creatinine 2.0 Labs (3/22): LDL 25 Labs (4/22): K 4.7, creatinine 2.0, hgb 12.3 Labs (5/22): K 4.4, creatinine 2.21   PMH: 1. Sick sinus syndrome s/p [employer] PCM.  2. HTN 3. H/o SBO 4. Renal cell carcinoma s/p right nephrectomy in 2008.  5. 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  Fibroscan in 11/14 showed advanced fibrosis concerning for cirrhosis. EGD in 11/14 showed no varices.  6. Diastolic CHF: Echo (8/18)  with EF 60-65%, bioprosthetic aortic valve with mean gradient 15 mmHg, mild MR.  CPX (9/14): peak VO2 10.4, VE/VCO2 43.8, inferolateral ST depression and ST elevation in V1/V2 with severe dyspnea and chest pain, severe functional limitation with ischemic changes.  Echo (9/14) with EF 60-65%, normal RV size and systolic function, mild-moderate MR, bioprosthetic aortic valve normal, PA systolic pressure 51 mmHg, no evidence for pericardial constriction though exam incomplete for constriction.  Echo (11/15) with EF 55-60%, bioprosthetic aortic valve, mild-moderate MR, moderate TR, PA systolic pressure 42 mmHg.  - TEE (5/17) with EF 60-65%, normal bioprosthetic aortic valve with mean gradient 13 mmHg, normal RV size and systolic function. - Echo (1/18) with EF 60-65%, normal bioprosthetic aortic valve, mild-moderate MR, PASP 35 mmHg.  - Echo (10/18) with EF 55-60%, bioprosthetic aortic valve mean  gradient 10 mmHg, MAC with mild mitral stenosis (mean gradient 7) and mild mitral regurgitation, moderate to severe TR, PASP 43 mmHg.  - RHC (9/18): mean RA 13, PA 44/18 mean 33, mean PCWP 19, CI 3.45, PVR 2.7 WU.  - Echo (1/20): EF 55-60%, mild LVH, mild MR, stable bioprosthetic aortic valve, moderate-severe TR.  - RHC (8/20): mean RA 14, PA 43/15 mean 28, mean PCWP 13, PAPI 2, PVR 4.2, CI 2.31 - Echo (8/20): EF 55-60%, RV mildly dilated with normal systolic function, mildly D-shaped septum suggestive of RV pressure/volume overload, rheumatic-appearing mitral valve with mild mitral stenosis (mean gradient 6, MVA by PHT 4 cm^2), no evidence for pericardial constriction, moderate TR, bioprosthetic aortic valve functioning normally with mean gradient 6 mmHg.  - CPX (11/20): peak VO2 9.8, VE/VCO2 slope 38, RER 1.11 => severe functional limitation from HF.   - Echo (9/21): EF 60-65% with mild LVH, mild RV dilation with mildly decreased systolic function and a D-shaped septum, PASP 40, normal bioprosthetic aortic valve,  rheumatic-appearing MV with mild-moderate mitral stenosis (mean gradient 9 but MVA by PHT 2.7 cm^2, trivial MR.  7. TAH-BSO 8. Type I diabetes: Has insulin pump.  9. H/o ischemic colitis. 10. H/o PUD. 11. Diabetic gastroparesis. 12. Hyperlipidemia: Myalgias with Crestor and atorvastatin, elevated LFTs with simvastatin.  Myalgias with > 10 mg pravastatin. Now on Praluent.  13. CKD stage IV: Sees Dr. Lorrene Reid 14. Bicuspid aortic valve s/p 19 mm Edwards pericardial valve in 1/11 (had post-operative Dresslers syndrome).   15. CAD: CABG with SVG-RCA in 12/05.  Redo SVG-RCA in 1/11 with AVR.  LHC/RHC (9/14) with mean RA 18, PA 62/25 mean 43, mean PCWP 26, CI 2.8, 70-80% distal LM stenosis, 80% ostial LAD stenosis, total occlusion RCA, SVG-PDA patent.  Patient had DES LM into LAD.  Plan to continue long-term ASA/Brilinta (poor responder to Plavix).  ETT-cardiolite (4/15) with 6' exercise, EF 60%, ST depression in recovery and mild chest pain, no ischemia or infarction by perfusion images.  Unstable angina 11/15 with 80% LM, patent SVG-RCA => DES to ostial LM.   - Lexiscan Cardiolite (2/18): EF 62%, no ischemia or infarction (normal).  She had a significant reaction to [place] and probably should not get again.  - Cardiolite (9/18): EF 72%, prior inferior infarct with mild peri-infarct ischemia.  - LHC (9/18): 2 layers stent left main - ostial LAD with 50% dLM in-stent restenosis, 75+% ostial LCx, totally occluded RCA, SVG-RCA patent.  Medical management planned.  - ETT-Cardiolite (4/19): EF normal, 7'11" exercise, no ischemia or infarction.  - Lexiscan Cardiolite (8/20): No ischemia/infarction.  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  17. Anemia: Iron-deficiency.  Suspect chronic GI blood loss.  EGD in 9/14 was unremarkable.  Capsule endoscopy 10/14 was unremarkable.  Has required periodic blood transfusions.  18. Atrial fibrillation: Paroxysmal, noted by [facility]  interrogation.  19. Carotid stenosis: Carotid dopplers (2/15) with 40-59% bilateral stenosis. Carotid dopplers (3/16) with 40-59% bilateral ICA stenosis. Carotid dopplers (3/17) with 40-59% bilateral ICA stenosis.  - Carotid dopplers (3/18) with mild BICA stenosis.  20. 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  22. C difficile colitis 2017 23. Gout 24. ?TIA: head CT negative.  25. OSA: Moderate, uses CPAP.  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 Hypercalcemia 28. 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  Mild-moderate on 9/21 echo.  29.  Tricuspid regurgitation: Moderate by 8/20 echo.  30. Lung nodules: Followed at [facility].   Current Outpatient Medications  Medication Sig Dispense Refill   acyclovir ointment (ZOVIRAX) 5 % Apply 1 application topically every 3 (three) hours as needed (fever blister/cold sores).     ALPRAZolam (XANAX) 0.5 MG tablet Take 1 tablet (0.5 mg total) by mouth 2 (two) times daily as needed for anxiety. 60 tablet 1   APIDRA 100 UNIT/ML injection Inject 40 Units into the skin as directed. INFUSE THROUGH INSULIN PUMP UTD, Bolus depends on carb intake  2   aspirin EC 81 MG tablet Take 81 mg by mouth at bedtime.     BRILINTA 60 MG TABS tablet TAKE ONE TABLET (60MG TOTAL) BY MOUTH TWICE DAILY 180 tablet 3   cetirizine (ZYRTEC) 10 MG tablet Take 10 mg by mouth daily.     clobetasol (OLUX) 0.05 % topical foam Apply 1 application topically 2 (two) times daily as needed (skin flaking).     Coenzyme Q10 (CO Q 10) 100 MG CAPS Take 100 mg by mouth at bedtime.      colchicine 0.6 MG tablet Take 0.6 mg by mouth daily as needed (gout flare).     CONTOUR NEXT TEST test strip USE TO TEST BLOOD SUGAR UP TO EIGHT TIMES A DAY     denosumab (PROLIA) 60 MG/ML SOSY injection Inject 60 mg into the skin every 6 (six) months.     diclofenac Sodium  (VOLTAREN) 1 % GEL as needed.     docusate sodium (COLACE) 100 MG capsule Take 100 mg by mouth as needed for mild constipation.     famotidine (PEPCID) 40 MG tablet Take 40 mg by mouth daily.     glucagon (GLUCAGON EMERGENCY) 1 MG injection Inject 1 mg into the vein once as needed (low blood sugar).      icosapent Ethyl (VASCEPA) 1 g capsule Take 2 capsules (2 g total) by mouth 2 (two) times daily. 360 capsule 3   isosorbide mononitrate (IMDUR) 120 MG 24 hr tablet TAKE ONE TABLET (120MG TOTAL) BY MOUTH DAILY 90 tablet 3   latanoprost (XALATAN) 0.005 % ophthalmic solution Place 1 drop into both eyes at bedtime.     metolazone (ZAROXOLYN) 2.5 MG tablet Take 2.5 mg (1 tab) once as directed by CHF clinic. Call before taking [phone] 3 tablet 0   metoprolol tartrate (LOPRESSOR) 50 MG tablet Take 50 mg by mouth 2 (two) times daily.     mometasone (NASONEX) 50 MCG/ACT nasal spray Place into the nose.     mupirocin cream (BACTROBAN) 2 % Apply 1 application topically 2 (two) times daily as needed. 90 g 0   nitroGLYCERIN (NITROLINGUAL) 0.4 MG/SPRAY spray PLACE 1 SPRAY UNDER THE TONGUE EVERY 5 MINUTES FOR 3 DOSES AS NEEDED FOR CHEST PAIN. 14.7 g 0   polyethylene glycol (MIRALAX / GLYCOLAX) 17 g packet Take 17 g by mouth daily.     pravastatin (PRAVACHOL) 10 MG tablet TAKE ONE TABLET (10 MG TOTAL) BY MOUTH DAILY. 90 tablet 3   REPATHA SURECLICK 938 MG/ML SOAJ INJECT 140MG INTO THE SKIN EVERY 14 DAYS 6 mL 3   rOPINIRole (REQUIP) 0.5 MG tablet Take 0.5 mg by mouth at bedtime. Additional .25 mg  as needed     silver nitrate applicators 18-29 % applicator Use as needed to stop superficial skin bleeding. 10 each 0   spironolactone (ALDACTONE) 25 MG tablet Take 12.5 mg by mouth daily.     torsemide (DEMADEX)  20 MG tablet Take 82m (3 tablets) by mouth daily alternating with 467m(2 tablets) daily 270 tablet 3   ULORIC 40 MG tablet Take 1 tablet (40 mg total) by mouth at bedtime. 90 tablet 3   UNABLE TO FIND  Med Name: So Clean CPAP cleaning system 1 each 0   Wheat Dextrin (BENEFIBER) POWD Take 1 Dose by mouth daily.      No current facility-administered medications for this encounter.    Allergies:   Avandia [rosiglitazone maleate], Cephalexin, Clindamycin, Clindamycin hcl, Humalog [insulin lispro], Lincomycin, Metformin, Novolog [insulin aspart], Penicillins, Rosiglitazone, Tizanidine, Adhesive [tape], Atorvastatin, Cholestyramine, Gentamycin [gentamicin], Iodinated diagnostic agents, Limonene, Pravastatin, Prednisone, Ranexa [ranolazine], Rosuvastatin, Simvastatin, Strawberry extract, Azelastine, Clopidogrel, Lisinopril, Oseltamivir phosphate, Tizanidine hcl, Codeine, Erythromycin, Hydrocodone-acetaminophen, Latex, Neomycin, Nickel, Ranitidine, Tamiflu [oseltamivir], and Tramadol   Social History:  The patient  reports that she has never smoked. She has never used smokeless tobacco. She reports that she does not drink alcohol and does not use drugs.   Family History:  The patient's family history includes Breast cancer (age of onset: 4148in her sister; Cancer (age of onset: 3053in her sister; Cancer (age of onset: 503in her maternal grandfather; Diabetes in her father; Glaucoma in her brother; Heart attack in her father, maternal grandmother, and mother; Heart disease in her father and mother; Hypertension in her brother; Lung cancer in her sister.   ROS:  Please see the history of present illness.   All other systems are personally reviewed and negative.   Exam:   BP 110/60   Pulse 68   Wt 52.5 kg (115 lb 12.8 oz)   SpO2 98%   BMI 20.84 kg/m  General: NAD Neck: No JVD, no thyromegaly or thyroid nodule.  Lungs: Clear to auscultation bilaterally with normal respiratory effort. CV: Nondisplaced PMI.  Heart regular S1/S2, no S3/S4, 2/6 SEM RUSB.  No peripheral edema.  No carotid bruit.  Normal pedal pulses.  Abdomen: Soft, nontender, no hepatosplenomegaly, no distention.  Skin: Intact without  lesions or rashes.  Neurologic: Alert and oriented x 3.  Psych: Normal affect. Extremities: No clubbing or cyanosis.  HEENT: Normal.   Recent Labs: 10/31/2020: ALT 18; Hemoglobin 12.3; Platelets 115 02/24/2021: B Natriuretic Peptide 99.4; BUN 70; Creatinine, Ser 2.12; Potassium 4.5; Sodium 134  Personally reviewed   Wt Readings from Last 3 Encounters:  02/24/21 52.5 kg (115 lb 12.8 oz)  01/06/21 51.3 kg (113 lb)  11/24/20 54.7 kg (120 lb 9.6 oz)    ASSESSMENT AND PLAN:  1. CAD: s/p SVG-RCA in 12/05, then redo SVG-RCA in 1/11--> distal left main/proximal LAD severe stenosis in 9/14.  PCI with DES to distal LM/proximal LAD was done rather than bypass as she would have been a poor candidate for redo sternotomy (2 prior sternotomies and history of chest wall radiation for Hodgkins disease).  Recurrent unstable angina with 80% ostial LM treated with repeat DES in 11/15.  Lexiscan Cardiolite in 9/18 inferior changes.  Repeat LHC (9/18) showed 50% distal left main in-stent restenosis and 75+% ostial LCx stenosis. I reviewed the films at the time with interventional cardiology => would be very difficult to intervene on the ostial LCx through 2 overlapping stents from left main into the LAD.  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 Given no ACS, we planned medical management and increased Imdur.  Cardiolite in 3/19 and again in 8/20 showed no ischemia or infarction.  She had exertional chest pain  at high elevation in [place], likely due to low oxygen tension.  However, no exertional chest pain since returning home.   - Given lack of symptoms currently, would not plan functional study.  - Continue Brilinta 60 mg bid (poor responder to Plavix) long-term given recurrent left main disease.  - Continue ASA 81, statin/Praluent/Vascepa.  - Continue Imdur 120 mg daily . - Continue metoprolol 50 mg bid but may need to decrease/stop if she develops more advanced heart block  (currently 1st degree AVB) and RV paces more (currently only rare).    - She was unable to tolerate ranolazine.  2. Bioprosthetic aortic valve replacement: In setting of bicuspid aortic valve, thoracic aorta not dilated on 2017 TEE.  Valve looked ok on 9/21 echo.   3. Chronic diastolic CHF: Restrictive cardiomyopathy.  Most recent echo in 9/21 shows normal LV systolic function, mildly dilated RV with mildly decreased systolic function, mild-moderate MS, normal bioprosthetic AoV, no evidence for pericardial constriction. RHC in 8/20 was concerning for right > left heart failure, she did appear to have equalization of diastolic pressures concerning for restrictive cardiomyopathy, possibly related to prior radiation. CPX in 11/20 showed severe HF limitation.  Balance of renal failure and RV failure has been difficult.  Creatinine 2.2 most recently.  She is not volume overloaded on exam today.  NYHA class II-III, symptoms stable since returning from [place].   - Continue torsemide 60 daily alternating with 40 daily.  BMET today.   - Continue spironolactone 12.5 mg daily.  - I had her evaluated at [facility] for heart/kidney transplant for CKD and restrictive cardiomyopathy, but extensive vascular calcification makes her a poor surgical candidate.  - No Jardiance/Farxiga given use of insulin pump.   4. CKD:  Patient has a single kidney s/p right nephrectomy and suspected diabetic nephropathy.  CKD stage 4, slowly progressive.  Creatinine 2.2 last check. I have been concerned for cardiorenal syndrome in the setting of RV > LV failure/restrictive cardiomyopathy.   - Not candidate for PD given prior abdominal surgery.  - If CKD progresses, she may be a candidate for home HD.  - BMET today.  5. Hyperlipidemia: She has not been able to tolerate any higher potency statin than pravastatin 10 mg daily.   Good lipids in 3/22.  - Continue pravastatin.  - Continue Repatha.   - She is on Vascepa for elevated TGs.  6.  Anemia: Chronic/slow GI bleed.  Unremarkable EGD and capsule endoscopy in fall 2014.  Unfortunately, needs to stay on Brilinta long-term (Plavix non-responder).   7. Atrial fibrillation: Paroxysmal, very brief episodes noted on PCM interrogation. Given GI bleeding and need to be on Brilinta, risks have been thought to outweigh benefits of anticoagulation. She is not a good Multaq candidate with CHF.  If she needs an antiarrhythmic, consider Tikosyn or Sotalol but renal function may make these meds difficult to use.  She has occasional palpitations but nothing prolonged.   - We were unable to place Watchman device due to nickel allergy.   8. Carotid bruit: Only mild stenosis on last dopplers in 3/18.  9. SSS: Pacemaker in place.  10. HTN: controlled. Continue current regimen.   11. OSA: Moderate, using Bipap.   12. ?TIA: Transient dysarthria in 3/18, could have been afib-related TIA.  No recurrent symptoms.  13. Thrombocytopenia: Chronic and mild. Follows with hematology.  14. 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  Based on 8/20 RHC and 9/21 echo it appears mild-moderate.  She would not be a  candidate for valve replacement.   Followup in 3 months.   Signed, [name], MD  02/24/2021  [facility] [address] [facility] [place] [address] [phone] (office) [phone] (fax)

## 2021-03-07 DIAGNOSIS — G4733 Obstructive sleep apnea (adult) (pediatric): Secondary | ICD-10-CM | POA: Diagnosis not present

## 2021-03-09 ENCOUNTER — Ambulatory Visit: Payer: Medicare PPO | Admitting: Neurology

## 2021-03-13 DIAGNOSIS — E1021 Type 1 diabetes mellitus with diabetic nephropathy: Secondary | ICD-10-CM | POA: Diagnosis not present

## 2021-03-15 ENCOUNTER — Ambulatory Visit: Payer: Medicare PPO | Admitting: Neurology

## 2021-03-15 ENCOUNTER — Encounter: Payer: Self-pay | Admitting: Neurology

## 2021-03-15 ENCOUNTER — Other Ambulatory Visit: Payer: Self-pay

## 2021-03-15 VITALS — BP 130/72 | HR 77 | Ht 62.0 in | Wt 116.0 lb

## 2021-03-15 DIAGNOSIS — R4789 Other speech disturbances: Secondary | ICD-10-CM | POA: Diagnosis not present

## 2021-03-15 DIAGNOSIS — R251 Tremor, unspecified: Secondary | ICD-10-CM | POA: Diagnosis not present

## 2021-03-15 DIAGNOSIS — G20 Parkinson's disease: Secondary | ICD-10-CM

## 2021-03-15 DIAGNOSIS — G2581 Restless legs syndrome: Secondary | ICD-10-CM

## 2021-03-15 DIAGNOSIS — R2689 Other abnormalities of gait and mobility: Secondary | ICD-10-CM

## 2021-03-15 DIAGNOSIS — R278 Other lack of coordination: Secondary | ICD-10-CM | POA: Diagnosis not present

## 2021-03-15 DIAGNOSIS — G20C Parkinsonism, unspecified: Secondary | ICD-10-CM

## 2021-03-15 MED ORDER — CARBIDOPA-LEVODOPA 25-100 MG PO TABS: 0.5000 | ORAL_TABLET | Freq: Three times a day (TID) | ORAL | 5 refills | Status: DC

## 2021-03-15 NOTE — Patient Instructions (Addendum)
It was nice to meet you today.  As discussed, I believe you may have signs and symptoms of a parkinson's like disease, called parkinsonism.   You may have some symptoms that can mimic parkinson's disease. This can affect your balance, your memory, your mood, your bowel and bladder function, your posture, balance and walking and your activities of daily living. However, we can consider supportive treatments and even medications for symptomatic treatment.  I do want to suggest a few things today:  Remember to drink plenty of fluid at least 6 glasses (8 oz each), eat healthy meals and do not skip any meals. Try to eat protein with a every meal and eat a healthy snack such as fruit or nuts in between meals. Try to keep a regular sleep-wake schedule and try to exercise daily, particularly in the form of walking, 10-20 minutes a day, if you can.   You may need to start using a walker aid for safety, and we may consider physical therapy.   As far as your medications are concerned, I would like to suggest a trial of Sinemet (generic name: carbidopa-levodopa) 25/100 mg: Take half a pill twice daily (8 AM and noon) for one week, then half a pill 3 times a day (8 AM, noon, and 4 PM) thereafter.  If you tolerate this medication, we can increase it further, such as a whole pill 3 times daily in the future.  Please try to take the medication away from you mealtimes, that is, ideally either one hour before or 2 hours after your meal to ensure optimal absorption. The medication can interfere with the protein content of your meal and trying to the protein in your food and therefore not get fully absorbed.  Common side effects reported are: Nausea, vomiting, sedation, confusion, lightheadedness. Rare side effects include hallucinations, severe nausea or vomiting, diarrhea and significant drop in blood pressure especially when going from lying to standing or from sitting to standing.   You can start this medication once  you have had a chance to discuss with your family.  As far as diagnostic testing, we will not do any scans at this time as you cannot have a brain MRI and your kidney function is not good enough to safely consider a special brain scan called DaTscan.    I would like to see you back in 3 months, sooner if we need to. Please call us with any interim questions, concerns, problems, updates or refill requests.  Our phone number is [phone]. We also have an after hours call service for urgent matters and there is a physician on-call for urgent questions, that cannot wait till the next work day. For any emergencies you know to call 911 or go to the nearest emergency room.   You can email me through my chart and also leave a phone message for our nurses.

## 2021-03-15 NOTE — Progress Notes (Signed)
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 atrophy or MSA-P.  Unfortunately, she cannot have a brain MRI and her impaired kidney function does not allow for pursuing a DaTscan safely in my opinion.  She is therefore advised to consider starting a trial of symptomatic medication.  I talked to her at length today.  We discussed the possibility that she may have a parkinsonian syndrome but I doubt that she has classic or idiopathic Parkinson's disease.  She has multiple medication intolerances and a complicated medical history.  All of these make medication trials, recommendations, and projections for prognosis very challenging in her case.  She understands this very well.  I suggested we consider low-dose Sinemet generic 25-100 mg strength half a pill twice daily and gradually increase it to half a pill 3 times daily for now and further if tolerated.  We talked about expectations and possible common side effects and even rare side effects.  She was given detailed instructions in writing which she will access  electronically on her portal.  She is encouraged to discuss this with her family and start the medication trial and keep [name] posted via phone call or MyChart messaging.  She is advised to discuss with her GI doctor the possibility of trying prescription medication for chronic constipation such as Linzess.  Her chronic constipation may tie in with underlying parkinsonism as well.  She has also had a history of syncope.  She is advised to follow-up in this clinic routinely in 3 months, sooner if needed.   This was an extended visit of over 1 hour.  I answered all her questions today and she was in agreement with the plan. Thank you very much for allowing me to participate in the care of this nice patient. If I can be of any further assistance to you please do not hesitate to call me at [phone].  Sincerely,   Star Age, MD, PhD

## 2021-03-16 ENCOUNTER — Encounter (HOSPITAL_COMMUNITY): Payer: Self-pay

## 2021-03-16 ENCOUNTER — Encounter: Payer: Self-pay | Admitting: Neurology

## 2021-03-17 DIAGNOSIS — H10413 Chronic giant papillary conjunctivitis, bilateral: Secondary | ICD-10-CM | POA: Diagnosis not present

## 2021-03-20 ENCOUNTER — Encounter: Payer: Self-pay | Admitting: Neurology

## 2021-03-22 DIAGNOSIS — K7400 Hepatic fibrosis, unspecified: Secondary | ICD-10-CM | POA: Diagnosis not present

## 2021-03-22 DIAGNOSIS — I2510 Atherosclerotic heart disease of native coronary artery without angina pectoris: Secondary | ICD-10-CM | POA: Diagnosis not present

## 2021-03-22 DIAGNOSIS — I501 Left ventricular failure: Secondary | ICD-10-CM | POA: Diagnosis not present

## 2021-03-22 DIAGNOSIS — K7581 Nonalcoholic steatohepatitis (NASH): Secondary | ICD-10-CM | POA: Diagnosis not present

## 2021-03-22 DIAGNOSIS — E1021 Type 1 diabetes mellitus with diabetic nephropathy: Secondary | ICD-10-CM | POA: Diagnosis not present

## 2021-03-22 DIAGNOSIS — N1832 Chronic kidney disease, stage 3b: Secondary | ICD-10-CM | POA: Diagnosis not present

## 2021-03-22 DIAGNOSIS — E1022 Type 1 diabetes mellitus with diabetic chronic kidney disease: Secondary | ICD-10-CM | POA: Diagnosis not present

## 2021-03-22 DIAGNOSIS — I132 Hypertensive heart and chronic kidney disease with heart failure and with stage 5 chronic kidney disease, or end stage renal disease: Secondary | ICD-10-CM | POA: Diagnosis not present

## 2021-03-22 DIAGNOSIS — Z9641 Presence of insulin pump (external) (internal): Secondary | ICD-10-CM | POA: Diagnosis not present

## 2021-03-22 DIAGNOSIS — D696 Thrombocytopenia, unspecified: Secondary | ICD-10-CM | POA: Diagnosis not present

## 2021-03-22 DIAGNOSIS — R413 Other amnesia: Secondary | ICD-10-CM | POA: Diagnosis not present

## 2021-03-22 DIAGNOSIS — M810 Age-related osteoporosis without current pathological fracture: Secondary | ICD-10-CM | POA: Diagnosis not present

## 2021-03-23 DIAGNOSIS — H10413 Chronic giant papillary conjunctivitis, bilateral: Secondary | ICD-10-CM | POA: Diagnosis not present

## 2021-03-27 ENCOUNTER — Encounter: Payer: Self-pay | Admitting: Neurology

## 2021-04-04 ENCOUNTER — Ambulatory Visit (INDEPENDENT_AMBULATORY_CARE_PROVIDER_SITE_OTHER): Payer: Medicare PPO

## 2021-04-04 DIAGNOSIS — I495 Sick sinus syndrome: Secondary | ICD-10-CM

## 2021-04-05 LAB — CUP PACEART REMOTE DEVICE CHECK
Battery Impedance: 709 Ohm
Battery Remaining Longevity: 85 mo
Battery Voltage: 2.8 V
Brady Statistic AP VP Percent: 0 %
Brady Statistic AP VS Percent: 1 %
Brady Statistic AS VP Percent: 0 %
Brady Statistic AS VS Percent: 99 %
Date Time Interrogation Session: 20220913172118
Implantable Lead Implant Date: 20140130
Implantable Lead Implant Date: 20140130
Implantable Lead Location: 753859
Implantable Lead Location: 753860
Implantable Lead Model: 5076
Implantable Lead Model: 5076
Implantable Pulse Generator Implant Date: 20140130
Lead Channel Impedance Value: 461 Ohm
Lead Channel Impedance Value: 614 Ohm
Lead Channel Pacing Threshold Amplitude: 0.5 V
Lead Channel Pacing Threshold Amplitude: 0.75 V
Lead Channel Pacing Threshold Pulse Width: 0.4 ms
Lead Channel Pacing Threshold Pulse Width: 0.4 ms
Lead Channel Setting Pacing Amplitude: 1.5 V
Lead Channel Setting Pacing Amplitude: 2.5 V
Lead Channel Setting Pacing Pulse Width: 0.4 ms
Lead Channel Setting Sensing Sensitivity: 5.6 mV

## 2021-04-11 DIAGNOSIS — M810 Age-related osteoporosis without current pathological fracture: Secondary | ICD-10-CM | POA: Diagnosis not present

## 2021-04-12 NOTE — Progress Notes (Signed)
Remote pacemaker transmission.   

## 2021-04-13 ENCOUNTER — Other Ambulatory Visit (HOSPITAL_COMMUNITY): Payer: Self-pay | Admitting: Cardiology

## 2021-04-14 DIAGNOSIS — Z23 Encounter for immunization: Secondary | ICD-10-CM | POA: Diagnosis not present

## 2021-04-14 DIAGNOSIS — N184 Chronic kidney disease, stage 4 (severe): Secondary | ICD-10-CM | POA: Diagnosis not present

## 2021-04-14 DIAGNOSIS — G20 Parkinson's disease: Secondary | ICD-10-CM | POA: Diagnosis not present

## 2021-04-14 DIAGNOSIS — R5383 Other fatigue: Secondary | ICD-10-CM | POA: Diagnosis not present

## 2021-04-14 DIAGNOSIS — Z6379 Other stressful life events affecting family and household: Secondary | ICD-10-CM | POA: Diagnosis not present

## 2021-04-16 ENCOUNTER — Encounter: Payer: Self-pay | Admitting: Neurology

## 2021-04-19 DIAGNOSIS — E785 Hyperlipidemia, unspecified: Secondary | ICD-10-CM | POA: Diagnosis not present

## 2021-04-19 DIAGNOSIS — I509 Heart failure, unspecified: Secondary | ICD-10-CM | POA: Diagnosis not present

## 2021-04-19 DIAGNOSIS — M810 Age-related osteoporosis without current pathological fracture: Secondary | ICD-10-CM | POA: Diagnosis not present

## 2021-04-19 DIAGNOSIS — N184 Chronic kidney disease, stage 4 (severe): Secondary | ICD-10-CM | POA: Diagnosis not present

## 2021-04-26 ENCOUNTER — Encounter: Payer: Self-pay | Admitting: Neurology

## 2021-04-26 NOTE — Telephone Encounter (Signed)
I called pt.  After she had spoken to me earlier, she had called her pcp, have not heard back, but she had fallen asleep for 4 hours.  I relayed Dr. Guadelupe Sabin message and she will f/u with pcp, seems like she had issue previously like this before they could not find source.  I relayed if abd pain, n/v bright red blood to go to ED.  She verbalized understanding.  Appreciated call back.

## 2021-04-26 NOTE — Telephone Encounter (Signed)
I called patient.  She stated she started the increase of her carbidopa levodopa to 4 tablets a day.  She began taking 4 one half tablets Sunday. "I felt tired and unwell Monday & Tuesday. Last night I began stomach cramps and nausea. So far I've had 4 episodes of hot water and sticky black feces since midnight. I have chills and then hot flashes and then chills. So far no vomiting just very nauseous. No fever. Lots okay belching. Spit up brown phlegm this morning. I don't think I can keep anything down" What do I do about my meds".  I relayed that these arenot a typical SE of CL.  I relayed to have her call her pcp.  I would relay message to Dr. Rexene Alberts. As a FYI.

## 2021-04-26 NOTE — Telephone Encounter (Signed)
I replied back to the patient, she is encouraged to see her PCP today or go to the emergency room immediately, her description of her stool and her symptoms raise concern for an underlying GI bleed.  If need be, please call her back to relay this personally.

## 2021-05-01 DIAGNOSIS — H401131 Primary open-angle glaucoma, bilateral, mild stage: Secondary | ICD-10-CM | POA: Diagnosis not present

## 2021-05-06 ENCOUNTER — Encounter: Payer: Self-pay | Admitting: Neurology

## 2021-05-09 ENCOUNTER — Other Ambulatory Visit: Payer: Self-pay

## 2021-05-09 ENCOUNTER — Inpatient Hospital Stay (HOSPITAL_COMMUNITY): Payer: Medicare PPO | Attending: Hematology

## 2021-05-09 DIAGNOSIS — Z905 Acquired absence of kidney: Secondary | ICD-10-CM | POA: Diagnosis not present

## 2021-05-09 DIAGNOSIS — D696 Thrombocytopenia, unspecified: Secondary | ICD-10-CM | POA: Diagnosis not present

## 2021-05-09 DIAGNOSIS — Z79899 Other long term (current) drug therapy: Secondary | ICD-10-CM | POA: Insufficient documentation

## 2021-05-09 DIAGNOSIS — Z923 Personal history of irradiation: Secondary | ICD-10-CM | POA: Insufficient documentation

## 2021-05-09 DIAGNOSIS — Z86000 Personal history of in-situ neoplasm of breast: Secondary | ICD-10-CM | POA: Diagnosis not present

## 2021-05-09 DIAGNOSIS — Z9012 Acquired absence of left breast and nipple: Secondary | ICD-10-CM | POA: Insufficient documentation

## 2021-05-09 DIAGNOSIS — Z85528 Personal history of other malignant neoplasm of kidney: Secondary | ICD-10-CM | POA: Insufficient documentation

## 2021-05-09 DIAGNOSIS — Z8571 Personal history of Hodgkin lymphoma: Secondary | ICD-10-CM | POA: Diagnosis not present

## 2021-05-09 LAB — CBC WITH DIFFERENTIAL/PLATELET
Abs Immature Granulocytes: 0.07 10*3/uL (ref 0.00–0.07)
Basophils Absolute: 0.1 10*3/uL (ref 0.0–0.1)
Basophils Relative: 1 %
Eosinophils Absolute: 0.2 10*3/uL (ref 0.0–0.5)
Eosinophils Relative: 2 %
HCT: 38.5 % (ref 36.0–46.0)
Hemoglobin: 12.5 g/dL (ref 12.0–15.0)
Immature Granulocytes: 1 %
Lymphocytes Relative: 16 %
Lymphs Abs: 1.5 10*3/uL (ref 0.7–4.0)
MCH: 30.9 pg (ref 26.0–34.0)
MCHC: 32.5 g/dL (ref 30.0–36.0)
MCV: 95.3 fL (ref 80.0–100.0)
Monocytes Absolute: 0.6 10*3/uL (ref 0.1–1.0)
Monocytes Relative: 7 %
Neutro Abs: 6.8 10*3/uL (ref 1.7–7.7)
Neutrophils Relative %: 73 %
Platelets: 120 10*3/uL — ABNORMAL LOW (ref 150–400)
RBC: 4.04 MIL/uL (ref 3.87–5.11)
RDW: 13.2 % (ref 11.5–15.5)
WBC: 9.2 10*3/uL (ref 4.0–10.5)
nRBC: 0 % (ref 0.0–0.2)

## 2021-05-09 LAB — COMPREHENSIVE METABOLIC PANEL
ALT: 8 U/L (ref 0–44)
AST: 23 U/L (ref 15–41)
Albumin: 4.7 g/dL (ref 3.5–5.0)
Alkaline Phosphatase: 95 U/L (ref 38–126)
Anion gap: 12 (ref 5–15)
BUN: 78 mg/dL — ABNORMAL HIGH (ref 8–23)
CO2: 26 mmol/L (ref 22–32)
Calcium: 10.2 mg/dL (ref 8.9–10.3)
Chloride: 98 mmol/L (ref 98–111)
Creatinine, Ser: 1.87 mg/dL — ABNORMAL HIGH (ref 0.44–1.00)
GFR, Estimated: 29 mL/min — ABNORMAL LOW (ref >60)
Glucose, Bld: 289 mg/dL — ABNORMAL HIGH (ref 70–99)
Potassium: 4.6 mmol/L (ref 3.5–5.1)
Sodium: 136 mmol/L (ref 135–145)
Total Bilirubin: 0.6 mg/dL (ref 0.3–1.2)
Total Protein: 8.9 g/dL — ABNORMAL HIGH (ref 6.5–8.1)

## 2021-05-09 LAB — LACTATE DEHYDROGENASE: LDH: 142 U/L (ref 98–192)

## 2021-05-10 LAB — KAPPA/LAMBDA LIGHT CHAINS
Kappa free light chain: 91.2 mg/L — ABNORMAL HIGH (ref 3.3–19.4)
Kappa, lambda light chain ratio: 2.01 — ABNORMAL HIGH (ref 0.26–1.65)
Lambda free light chains: 45.3 mg/L — ABNORMAL HIGH (ref 5.7–26.3)

## 2021-05-11 ENCOUNTER — Telehealth: Payer: Self-pay | Admitting: *Deleted

## 2021-05-11 DIAGNOSIS — G4733 Obstructive sleep apnea (adult) (pediatric): Secondary | ICD-10-CM

## 2021-05-11 NOTE — Telephone Encounter (Signed)
Per dr Radford Pax, Change BiPAP to auto BiPAP with IPAP max 20cm H2O, EPAP  min  5cm H2O and PS 4cm H2O and get a dowlnoad in 4 weeks.

## 2021-05-11 NOTE — Telephone Encounter (Signed)
Order placed to [place] to Change BiPAP to auto BiPAP with IPAP max 20cm H2O, EPAP  min  5cm H2O and PS 4cm H2O and get a dowlnoad in 4 weeks.

## 2021-05-12 LAB — PROTEIN ELECTROPHORESIS, SERUM
A/G Ratio: 1.1 (ref 0.7–1.7)
Albumin ELP: 4.4 g/dL (ref 2.9–4.4)
Alpha-1-Globulin: 0.2 g/dL (ref 0.0–0.4)
Alpha-2-Globulin: 0.9 g/dL (ref 0.4–1.0)
Beta Globulin: 1.2 g/dL (ref 0.7–1.3)
Gamma Globulin: 1.5 g/dL (ref 0.4–1.8)
Globulin, Total: 3.9 g/dL (ref 2.2–3.9)
Total Protein ELP: 8.3 g/dL (ref 6.0–8.5)

## 2021-05-12 LAB — IMMUNOFIXATION ELECTROPHORESIS
IgA: 304 mg/dL (ref 87–352)
IgG (Immunoglobin G), Serum: 1290 mg/dL (ref 586–1602)
IgM (Immunoglobulin M), Srm: 224 mg/dL — ABNORMAL HIGH (ref 26–217)
Total Protein ELP: 8 g/dL (ref 6.0–8.5)

## 2021-05-16 ENCOUNTER — Ambulatory Visit (HOSPITAL_COMMUNITY): Payer: Medicare PPO | Admitting: Hematology

## 2021-05-19 DIAGNOSIS — Z20822 Contact with and (suspected) exposure to covid-19: Secondary | ICD-10-CM | POA: Diagnosis not present

## 2021-05-20 NOTE — Progress Notes (Signed)
[place], [place] [address]   CLINIC:  Medical Oncology/Hematology  PCP:  [name], [place] / [place] [address]  [phone]  REASON FOR VISIT:  Follow-up for thrombocytopenia  PRIOR THERAPY: none  CURRENT THERAPY: surveillance  INTERVAL HISTORY:  Ms. SRIJA SOUTHARD, a 70 y.o. female, returns for routine follow-up for her thrombocytopenia. Ellice was last seen on 11/07/2020.  Today she reports feeling well. She reports fatigue during the day. She reports occasional tremors while holding her phone. She reports easy bleeding and bruising at the site of her Dexcom. She continues taking Repatha.   REVIEW OF SYSTEMS:  Review of Systems  Constitutional:  Positive for fatigue (75%). Negative for appetite change (60%).  Respiratory:  Positive for shortness of breath (with exertion).   Cardiovascular:  Positive for chest pain (A-fib).  Gastrointestinal:  Positive for constipation.  Neurological:  Positive for headaches.  Hematological:  Bruises/bleeds easily.  Psychiatric/Behavioral:  The patient is nervous/anxious.   All other systems reviewed and are negative.  PAST MEDICAL/SURGICAL HISTORY:  Past Medical History:  Diagnosis Date   Anxiety    Aortic stenosis    a. bicuspid aortic valve; mean gradient of 20 mmHg in 2/10; b. s/p bioprosth AVR (19-mm Edwards pericardial valve) with a redo coronary artery bypass graft procedure in 07/2009; postoperative Dressler's syndrome     Arthritis    Atrial fibrillation (HCC)    Breast cancer ([place])    Carcinoma, renal cell ([place]) 05/2007   Laparoscopic right nephrectomy   Chromophilic renal cell carcinoma (HCC)    Chronic diastolic CHF (congestive heart failure) ([place])    a. 9.2014 EchoP EF 60-65%, no rwma, bioprosth AVR, mean gradient of 22mHg, mild to mod MR, PASP 540mg.   CKD (chronic kidney disease), stage II    CKD (chronic kidney disease), stage IV (HCC)    Colitis,  ischemic ([facility]   Coronary artery disease    a. initial CABG with SVG-RCA in 12/05. b. redo SVG-RCA and bioprosthetic AVR in 1/11. d. 9/14 - PCI with DES to distal LM/proximal LAD was done rather than bypass as she would have been a poor candidate for redo sternotomy. d. S/p DES to prox LM 05/2014.   Diabetes mellitus 03/2010    TYPE II: Hemoglobin A1c of 7.4 in  03/2010; 8.4 in 06/2010; treated with insulin pump   Duodenal ulcer    Remote; H. pylori positive   Gastroparesis due to DM ([facility]10/04/2012   Genetic testing 01/14/2017   Ms. Symmonds underwent genetic counseling and testing for hereditary cancer syndromes on 01/01/2017. Her results were negative for mutations in all 83 genes analyzed by [facility]'s 83-gene Common Hereditary Cancers Panel. Genes analyzed include: ALK, APC, ATM, AXIN2, BAP1, BARD1, BLM, BMPR1A, BRCA1, BRCA2, BRIP1, CASR, CDC73, CDH1, CDK4, CDKN1B, CDKN1C, CDKN2A, CEBPA, CHEK2, CTNNA1, DICER1, DIS3L2,    Glaucoma 1998   Heart disease    Hodgkin's disease ([facility]1991   Mantle radiation therapy   Hodgkin's disease ([facility]   Hyperlipidemia    Hyperplastic gastric polyp 06/23/2012   Hypertension    Iron deficiency anemia    a. 03/2013 EGD: essentially normal. Possible slow GIB.   Migraines    NASH (nonalcoholic steatohepatitis) 1999   -biopsy in 1999   OSA on CPAP 04/24/2017   Moderate with AHI 22/hr now on CPAP at 18cm H2o   OSA treated with BiPAP    Osteopenia  hip on DEXA in October 2007.   Osteoporosis    Oxygen deficiency    Pacemaker    six sinus syndrome   PAF (paroxysmal atrial fibrillation) ([place])    a.  h/o brief episodes noted on ppm interrogation. b. given GI bleeding and need to be on both ASA 81 and Brilinta, risks likely outweigh benefits of anticoagulation.    Renal insufficiency    Small bowel obstruction (HCC)    Recurrent; resolved after resection of a lipoma   Syncope    a. H/o recurrent syncope with pauses on loop recorder s/p Medtronic  pacemaker 07/2012.   Type 1 diabetes [facility])    Past Surgical History:  Procedure Laterality Date   ABDOMINAL HYSTERECTOMY  2000   AORTIC VALVE REPLACEMENT  2011   Aortic valve replacement surgery with a 19 mm bioprosthetic device post redo CABG January 2011.   BONE MARROW BIOPSY     BREAST EXCISIONAL BIOPSY     benign, bilateral   CARDIAC CATHETERIZATION     CERVICAL BIOPSY     COLONOSCOPY     Multiple with adenomatous polyps   COLONOSCOPY  06/23/2012   Procedure: COLONOSCOPY;  Surgeon: Gatha Mayer, MD;  Location: [facility];  Service: Endoscopy;  Laterality: N/A;   CORONARY ARTERY BYPASS GRAFT  2005, 2011   CABG-most recent SVG to RCA-12/05; RCA occluded with patent graft in 2006; Redo bypass in 07/2009   ESOPHAGOGASTRODUODENOSCOPY     ESOPHAGOGASTRODUODENOSCOPY  06/23/2012   Procedure: ESOPHAGOGASTRODUODENOSCOPY (EGD);  Surgeon: Gatha Mayer, MD;  Location: [facility];  Service: Endoscopy;  Laterality: N/A;   ESOPHAGOGASTRODUODENOSCOPY N/A 04/06/2013   Procedure: ESOPHAGOGASTRODUODENOSCOPY (EGD);  Surgeon: Irene Shipper, MD;  [facility]COPY;  Service: Endoscopy;  Laterality: N/A;   EXPLORATORY LAPAROTOMY W/ BOWEL RESECTION     Small bowel resection of lipoma   GIVENS CAPSULE STUDY N/A 04/30/2013   Procedure: GIVENS CAPSULE STUDY;  Surgeon: Gatha Mayer, MD;  Location: [facility];  Service: Endoscopy;  Laterality: N/A;   LEFT AND RIGHT HEART CATHETERIZATION WITH CORONARY ANGIOGRAM N/A 04/07/2013   Procedure: LEFT AND RIGHT HEART CATHETERIZATION WITH CORONARY ANGIOGRAM;  Surgeon: Larey Dresser, MD;  Location: [facility] CATH LAB;  Service: Cardiovascular;  Laterality: N/A;   LEFT ATRIAL APPENDAGE OCCLUSION N/A 02/16/2016   Watchman not placed - nickel allergy   LEFT HEART CATHETERIZATION WITH CORONARY/GRAFT ANGIOGRAM N/A 06/07/2014   Procedure: LEFT HEART CATHETERIZATION WITH Beatrix Fetters;  Surgeon: Burnell Blanks, MD;  Location[facility] C[facility] LAB;  Service:  Cardiovascular;  Laterality: N/A;   LIVER BIOPSY     LOOP RECORDER IMPLANT N/A 12/10/2011   Procedure: LOOP RECORDER IMPLANT;  Surgeon: Evans Lance, MD;  Location: [facility] CATH LAB;  Service: Cardiovascular;  Laterality: N/A;   MALONEY DILATION  06/23/2012   Procedure: [name] DILATION;  Surgeon: Gatha Mayer, MD;  [facility]: [facility];  Service: Endoscopy;;  79 fr   MASTECTOMY Left 05/17/2015   total    NEPHRECTOMY  2008   Laparoscopic right nephrectomy, renal cell carcinoma   PACEMAKER INSERTION  08/21/2012   Medtronic Adapta L dual-chamber pacemaker   PERCUTANEOUS CORONARY STENT INTERVENTION (PCI-S) N/A 04/09/2013   Procedure: PERCUTANEOUS CORONARY STENT INTERVENTION (PCI-S);  Surgeon: Burnell Blanks, MD;  Location: [facility] C[facility] LAB;  Service: Cardiovascular;  Laterality: N/A;   PERMANENT PACEMAKER INSERTION N/A 08/21/2012   Procedure: PERMANENT PACEMAKER INSERTION;  Surgeon: Evans Lance, MD;  Location: [facility] CATH LAB;  Service: Cardiovascular;  Laterality:  N/A;   RIGHT HEART CATH N/A 03/18/2019   Procedure: RIGHT HEART CATH;  Surgeon: Larey Dresser, MD;  Location: [facility] CV LAB;  Service: Cardiovascular;  Laterality: N/A;   RIGHT HEART CATH AND CORONARY/GRAFT ANGIOGRAPHY N/A 04/19/2017   Procedure: RIGHT HEART CATH AND CORONARY/GRAFT ANGIOGRAPHY;  Surgeon: Larey Dresser, MD;  Location: [facility] CV LAB;  Service: Cardiovascular;  Laterality: N/A;   TEE WITHOUT CARDIOVERSION N/A 12/07/2015   Procedure: TRANSESOPHAGEAL ECHOCARDIOGRAM (TEE);  Surgeon: Larey Dresser, MD;  Location: [facility];  Service: Cardiovascular;  Laterality: N/A;   TOTAL ABDOMINAL HYSTERECTOMY W/ BILATERAL SALPINGOOPHORECTOMY  2000   endometriosis and ovarian cysts   TOTAL MASTECTOMY Left 05/17/2015   Procedure: LEFT TOTAL MASTECTOMY;  Surgeon: Rolm Bookbinder, MD;  Location: [facility];  Service: General;  Laterality: Left;   TUMOR EXCISION  1981   removal of Hodgkins lymphoma    SOCIAL  HISTORY:  Social History   Socioeconomic History   Marital status: Married    Spouse name: Not on file   Number of children: 2   Years of education: Not on file   Highest education level: Doctorate  Occupational History   Occupation: retired    Comment: elementary principal  Tobacco Use   Smoking status: Never   Smokeless tobacco: Never  Vaping Use   Vaping Use: Never used  Substance and Sexual Activity   Alcohol use: No    Comment: social, 74s   Drug use: Never   Sexual activity: Yes  Other Topics Concern   Not on file  Social History Narrative   Retired Youth worker principal, married    Started disability September 2013      Has one daughter in [place], works at [employer]. Has a son, Aaron Edelman, who is Retail buyer.   Eats all food groups.   Wear seatbelt.    Attends church. [place], [place]. [place].    Social Determinants of Health   Financial Resource Strain: Not on file  Food Insecurity: Not on file  Transportation Needs: Not on file  Physical Activity: Not on file  Stress: Not on file  Social Connections: Not on file  Intimate Partner Violence: Not on file    FAMILY HISTORY:  Family History  Problem Relation Age of Onset   Heart attack Mother        d.61   Heart disease Mother    Anal fissures Mother    Heart attack Father        d.43   Diabetes Father    Heart disease Father    Lung cancer Sister        d.61 metastases to brain. History of smoking. Maternal half-sister.   Cancer Sister 30       unspecified gynecologic cancer (either cervical or ovarian) in her 50s.   Breast cancer Sister 65       d.42 from metastatic disease. Paternal half-sister.   Hypertension Brother    Glaucoma Brother    Heart attack Maternal Grandmother    Cancer Maternal Grandfather 2       d.52s oral cancer   Parkinson's disease Paternal Aunt    Colon cancer Neg Hx    Stomach cancer Neg Hx     CURRENT MEDICATIONS:  Current Outpatient Medications  Medication Sig  Dispense Refill   APIDRA 100 UNIT/ML injection Inject 40 Units into the skin as directed. INFUSE THROUGH INSULIN PUMP UTD, Bolus depends on carb intake  2   aspirin EC 81  MG tablet Take 81 mg by mouth at bedtime.     BRILINTA 60 MG TABS tablet TAKE ONE TABLET (60MG TOTAL) BY MOUTH TWICE DAILY 180 tablet 3   cetirizine (ZYRTEC) 10 MG tablet Take 10 mg by mouth daily.     Coenzyme Q10 (CO Q 10) 100 MG CAPS Take 100 mg by mouth at bedtime.      CONTOUR NEXT TEST test strip USE TO TEST BLOOD SUGAR UP TO EIGHT TIMES A DAY     denosumab (PROLIA) 60 MG/ML SOSY injection Inject 60 mg into the skin every 6 (six) months.     diclofenac Sodium (VOLTAREN) 1 % GEL as needed.     docusate sodium (COLACE) 100 MG capsule Take 100 mg by mouth as needed for mild constipation.     famotidine (PEPCID) 40 MG tablet Take 40 mg by mouth daily. Takes a second tablet at night if needed.     icosapent Ethyl (VASCEPA) 1 g capsule Take 2 capsules (2 g total) by mouth 2 (two) times daily. 360 capsule 3   isosorbide mononitrate (IMDUR) 120 MG 24 hr tablet TAKE ONE TABLET (120MG TOTAL) BY MOUTH DAILY 90 tablet 3   latanoprost (XALATAN) 0.005 % ophthalmic solution Place 1 drop into both eyes at bedtime.     metolazone (ZAROXOLYN) 2.5 MG tablet Take 2.5 mg (1 tab) once as directed by CHF clinic. Call before taking [phone] 3 tablet 0   metoprolol tartrate (LOPRESSOR) 50 MG tablet Take 50 mg by mouth 2 (two) times daily.     nitroGLYCERIN (NITROLINGUAL) 0.4 MG/SPRAY spray PLACE 1 SPRAY UNDER THE TONGUE EVERY 5 MINUTES FOR 3 DOSES AS NEEDED FOR CHEST PAIN. 14.7 g 0   polyethylene glycol (MIRALAX / GLYCOLAX) 17 g packet Take 17 g by mouth daily.     pravastatin (PRAVACHOL) 10 MG tablet TAKE ONE TABLET (10 MG TOTAL) BY MOUTH DAILY. 90 tablet 3   REPATHA SURECLICK 062 MG/ML SOAJ INJECT 140MG INTO THE SKIN EVERY 14 DAYS 6 mL 3   rOPINIRole (REQUIP) 0.25 MG tablet Take 0.25 mg by mouth at bedtime.     silver nitrate applicators  69-48 % applicator Use as needed to stop superficial skin bleeding. 10 each 0   spironolactone (ALDACTONE) 25 MG tablet Take 12.5 mg by mouth daily.     torsemide (DEMADEX) 20 MG tablet Take 51m (3 tablets) by mouth daily alternating with 410m(2 tablets) daily (Patient taking differently: Take 6011m3 tablets) by mouth daily alternating with 55m74m tablets) daily Takes 3-2-2-3-2-2) 270 tablet 3   ULORIC 40 MG tablet Take 1 tablet (40 mg total) by mouth at bedtime. 90 tablet 3   UNABLE TO FIND Med Name: So Clean CPAP cleaning system 1 each 0   Wheat Dextrin (BENEFIBER) POWD Take 1 Dose by mouth daily.      acyclovir ointment (ZOVIRAX) 5 % Apply 1 application topically every 3 (three) hours as needed (fever blister/cold sores). (Patient not taking: Reported on 05/22/2021)     ALPRAZolam (XANAX) 0.5 MG tablet Take 1 tablet (0.5 mg total) by mouth 2 (two) times daily as needed for anxiety. (Patient not taking: Reported on 05/22/2021) 60 tablet 1   clobetasol (OLUX) 0.05 % topical foam Apply 1 application topically 2 (two) times daily as needed (skin flaking). (Patient not taking: Reported on 05/22/2021)     colchicine 0.6 MG tablet Take 0.6 mg by mouth daily as needed (gout flare). (Patient not taking: Reported on 05/22/2021)  glucagon (GLUCAGON EMERGENCY) 1 MG injection Inject 1 mg into the vein once as needed (low blood sugar).  (Patient not taking: Reported on 05/22/2021)     mometasone (NASONEX) 50 MCG/ACT nasal spray Place into the nose. As needed     mupirocin cream (BACTROBAN) 2 % Apply 1 application topically 2 (two) times daily as needed. (Patient not taking: Reported on 05/22/2021) 90 g 0   No current facility-administered medications for this visit.    ALLERGIES:  Allergies  Allergen Reactions   Avandia [Rosiglitazone Maleate] Other (See Comments)    Congestive heart failure   Cephalexin Anaphylaxis, Swelling and Rash    Extremities swell , throat swelling    Clindamycin  Anaphylaxis and Shortness Of Breath    Other reaction(s): esophageal spasms   Clindamycin Hcl Anaphylaxis and Shortness Of Breath   Humalog [Insulin Lispro] Itching     big knots and whelp    Lincomycin Anaphylaxis and Shortness Of Breath   Metformin Other (See Comments)    Congestive heart failure   Novolog [Insulin Aspart] Hives and Itching    Humalog & Novolog big knots and whelp     Penicillins Anaphylaxis, Hives, Swelling and Rash    Did it involve swelling of the face/tongue/throat, SOB, or low BP? Yes Did it involve sudden or severe rash/hives, skin peeling, or any reaction on the inside of your mouth or nose? No Did you need to seek medical attention at a hospital or doctor's office? Yes When did it last happen?      Childhood allergy  If all above answers are "NO", may proceed with cephalosporin use.    Rosiglitazone Other (See Comments)    Congestive heart failure   Tizanidine Other (See Comments)    Dizziness, Mental Status Changes, Hallucination     Adhesive [Tape] Other (See Comments)    Tears skin, Please use "paper" tape   Atorvastatin Other (See Comments)    increased LFT's, no muscle weakness, leg pain Muscle and joint pain increased LFT's  Increased LFTs   Cholestyramine Other (See Comments)    Liver Disorder Elevated LFTs     Gentamycin [Gentamicin] Hives, Itching and Rash    Fine red bumps.  Reaction noted post loop recorder implant, where gentamycin was used for irrigation. Topical prep   Iodinated Diagnostic Agents Other (See Comments)    PAIN DURING LYMPHANGIOGRAM '81, NO ALLERGY TO IV CONTRAST.  Has had procedures with Iodine since without problems.    Limonene Hives, Itching, Rash and Other (See Comments)    Reaction noted post loop recorder implant, where gentamycin was used for irrigation. Topical prep   Prednisone Itching and Other (See Comments)    B/P went high, itching all over.  Tolerates with benadryl    Ranexa [Ranolazine] Other (See  Comments)    Nausea, dizziness, low blood sugar, tingling in right hand, "blood blisters" on tongue    Rosuvastatin Nausea And Vomiting, Nausea Only and Other (See Comments)    Muscle and joint pain & increased LFTs; tolerates Pravastatin 58m (max) Other reaction(s): elevated LFTs   Simvastatin Other (See Comments)    Increased LFT's    Strawberry Extract Hives, Itching, Rash and Other (See Comments)    Reaction noted post loop recorder implant, where gentamycin was used for irrigation. Topical prep.  Can eat fresh strawberries    Azelastine     Other reaction(s): metallic taste,spitting up blood   Clopidogrel Other (See Comments)    Does not work for patient  Medicine was not effective Other reaction(s): Unknown   Lisinopril     Other reaction(s): nausea and loose stools   Oseltamivir Phosphate     Other reaction(s): Nausea and vomiting   Tizanidine Hcl     Other reaction(s): dizzy, hallucinations   Codeine Nausea And Vomiting   Erythromycin Nausea And Vomiting   Hydrocodone-Acetaminophen Itching, Rash and Other (See Comments)    itching   Latex Itching and Other (See Comments)    I"if wear gloves hands get red ,itchy no prioblem if other wear and touch me"   Neomycin Rash and Other (See Comments)    Redness   Nickel Itching   Ranitidine Nausea Only   Tamiflu [Oseltamivir] Nausea And Vomiting   Tramadol Nausea Only    PHYSICAL EXAM:  Performance status (ECOG): 1 - Symptomatic but completely ambulatory  Vitals:   05/22/21 1431  BP: 129/75  Pulse: 89  Resp: 17  Temp: 97.6 F (36.4 C)  SpO2: 98%   Wt Readings from Last 3 Encounters:  05/22/21 118 lb 4.8 oz (53.7 kg)  03/15/21 116 lb (52.6 kg)  02/24/21 115 lb 12.8 oz (52.5 kg)   Physical Exam Vitals reviewed.  Constitutional:      Appearance: Normal appearance.  Cardiovascular:     Rate and Rhythm: Normal rate and regular rhythm.     Pulses: Normal pulses.     Heart sounds: Normal heart sounds.  Pulmonary:      Effort: Pulmonary effort is normal.     Breath sounds: Normal breath sounds.  Abdominal:     Palpations: Abdomen is soft. There is no hepatomegaly, splenomegaly or mass.     Tenderness: There is no abdominal tenderness.  Neurological:     General: No focal deficit present.     Mental Status: She is alert and oriented to person, place, and time.  Psychiatric:        Mood and Affect: Mood normal.        Behavior: Behavior normal.    LABORATORY DATA:  I have reviewed the labs as listed.  CBC Latest Ref Rng & Units 05/09/2021 10/31/2020 04/25/2020  WBC 4.0 - 10.5 K/uL 9.2 8.5 8.4  Hemoglobin 12.0 - 15.0 g/dL 12.5 12.3 12.1  Hematocrit 36.0 - 46.0 % 38.5 38.3 38.0  Platelets 150 - 400 K/uL 120(L) 115(L) 124(L)   CMP Latest Ref Rng & Units 05/09/2021 02/24/2021 12/05/2020  Glucose 70 - 99 mg/dL 289(H) 271(H) 210(H)  BUN 8 - 23 mg/dL 78(H) 70(H) 67(H)  Creatinine 0.44 - 1.00 mg/dL 1.87(H) 2.12(H) 2.21(H)  Sodium 135 - 145 mmol/L 136 134(L) 140  Potassium 3.5 - 5.1 mmol/L 4.6 4.5 4.4  Chloride 98 - 111 mmol/L 98 100 102  CO2 22 - 32 mmol/L 26 24 30   Calcium 8.9 - 10.3 mg/dL 10.2 9.8 10.4(H)  Total Protein 6.5 - 8.1 g/dL 8.9(H) - -  Total Bilirubin 0.3 - 1.2 mg/dL 0.6 - -  Alkaline Phos 38 - 126 U/L 95 - -  AST 15 - 41 U/L 23 - -  ALT 0 - 44 U/L 8 - -      Component Value Date/Time   RBC 4.04 05/09/2021 1335   MCV 95.3 05/09/2021 1335   MCV 92 09/16/2019 1533   MCV 88.8 12/13/2016 1427   MCH 30.9 05/09/2021 1335   MCHC 32.5 05/09/2021 1335   RDW 13.2 05/09/2021 1335   RDW 13.2 09/16/2019 1533   RDW 14.6 (H) 12/13/2016 1427   LYMPHSABS 1.5  05/09/2021 1335   LYMPHSABS 1.2 12/13/2016 1427   MONOABS 0.6 05/09/2021 1335   MONOABS 0.8 12/13/2016 1427   EOSABS 0.2 05/09/2021 1335   EOSABS 0.2 12/13/2016 1427   BASOSABS 0.1 05/09/2021 1335   BASOSABS 0.1 12/13/2016 1427    DIAGNOSTIC IMAGING:  I have independently reviewed the scans and discussed with the patient. No results  found.   ASSESSMENT:  1.  Mild thrombocytopenia: -CT of the abdomen did not show any splenomegaly.  Previous CT disorder work-up was negative. -Peripheral blood smear showed giant platelets.  Clinically consistent with immune mediated thrombocytopenia.   2.  Mild hypercalcemia: -Her calcium level for the last 3 times in our clinic was normal to upper limit of normal. -Previous vitamin D was 75.  Intact PTH was normal.  Nonparathyroid hypercalcemia.  Calcitriol was 16.5.  PTH RP was less than 2. -SPEP and immunofixation was negative.  Free light chain ratio was elevated.   3.  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 -Last mammogram on 08/04/2019 reviewed by me was BI-RADS Category 4 with 0.6 cm mixed echogenicity mass in the right breast at 9:00. -Aspiration of this 0.6 cm complex cyst was successful on 08/24/2019.  Aspiration of the 0.4 cm cyst in the right breast 9:00 was successful.  Routine annual mammogram was recommended.   4.  Stage IIb Hodgkin's disease: -She received mantle radiation in 1981.  Continues to be in remission since then.  Does not have any B symptoms.   5.  Chromophobe renal cell carcinoma: -Nephrectomy in 2008.  Last CT in September 2019 showed stable 14 mm left renal lesion.   PLAN:  1.  Mild thrombocytopenia: - CBC reveals platelet count 120 and stable. - This is clinically consistent with immune mediated thrombocytopenia. - She has easy bruising but denies any active bleeding.  She is also on Brilinta which could be contributing to the bruising. - We will see her back in a year for follow-up with repeat labs.   2.  Mild hypercalcemia: - Her calcium today is 10.2. - SPEP, immunofixation were negative. - Free light chain ratio is 2.01, kappa light chains 91 and lambda light chains 45 consistent with CKD.   3.  Health maintenance: - She will have mammogram done in January 2023 at [facility].  Orders placed this encounter:   No orders of the defined types were placed in this encounter.    Derek Jack, MD [place] [phone]   I, Thana Ates, am acting as a scribe for Dr. Derek Jack.  I, Derek Jack MD, have reviewed the above documentation for accuracy and completeness, and I agree with the above.

## 2021-05-22 ENCOUNTER — Other Ambulatory Visit (HOSPITAL_COMMUNITY): Payer: Self-pay | Admitting: Cardiology

## 2021-05-22 ENCOUNTER — Other Ambulatory Visit: Payer: Self-pay

## 2021-05-22 ENCOUNTER — Inpatient Hospital Stay (HOSPITAL_COMMUNITY): Payer: Medicare PPO | Admitting: Hematology

## 2021-05-22 VITALS — BP 129/75 | HR 89 | Temp 97.6°F | Resp 17 | Wt 118.3 lb

## 2021-05-22 DIAGNOSIS — Z86000 Personal history of in-situ neoplasm of breast: Secondary | ICD-10-CM | POA: Diagnosis not present

## 2021-05-22 DIAGNOSIS — Z8571 Personal history of Hodgkin lymphoma: Secondary | ICD-10-CM | POA: Diagnosis not present

## 2021-05-22 DIAGNOSIS — D696 Thrombocytopenia, unspecified: Secondary | ICD-10-CM

## 2021-05-22 DIAGNOSIS — Z85528 Personal history of other malignant neoplasm of kidney: Secondary | ICD-10-CM | POA: Diagnosis not present

## 2021-05-22 DIAGNOSIS — Z9012 Acquired absence of left breast and nipple: Secondary | ICD-10-CM | POA: Diagnosis not present

## 2021-05-22 DIAGNOSIS — Z79899 Other long term (current) drug therapy: Secondary | ICD-10-CM | POA: Diagnosis not present

## 2021-05-22 DIAGNOSIS — Z905 Acquired absence of kidney: Secondary | ICD-10-CM | POA: Diagnosis not present

## 2021-05-22 DIAGNOSIS — Z923 Personal history of irradiation: Secondary | ICD-10-CM | POA: Diagnosis not present

## 2021-05-22 NOTE — Patient Instructions (Signed)
[facility] Discharge Instructions  You were seen and examined today by Dr. Delton Coombes. He reviewed your most recent labs and everything looks okay. Please get your mammogram in January 2023 and keep your follow up with [name] in year.   Thank you for choosing [facility] at [facility] to provide your oncology and hematology care.  To afford each patient quality time with our provider, please arrive at least 15 minutes before your scheduled appointment time.   If you have a lab appointment with the [facility] please come in thru the Main Entrance and check in at the main information desk.  You need to re-schedule your appointment should you arrive 10 or more minutes late.  We strive to give you quality time with our providers, and arriving late affects you and other patients whose appointments are after yours.  Also, if you no show three or more times for appointments you may be dismissed from the clinic at the providers discretion.     Again, thank you for choosing [facility].  Our hope is that these requests will decrease the amount of time that you wait before being seen by our physicians.       _____________________________________________________________  Should you have questions after your visit to [facility], please contact our office at [phone] and follow the prompts.  Our office hours are 8:00 a.m. and 4:30 p.m. Monday - Friday.  Please note that voicemails left after 4:00 p.m. may not be returned until the following business day.  We are closed weekends and major holidays.  You do have access to a nurse 24-7, just call the main number to the clinic [phone] and do not press any options, hold on the line and a nurse will answer the phone.    For prescription refill requests, have your pharmacy contact our office and allow 72 hours.    Due to Covid, you will need to wear a mask upon entering  the hospital. If you do not have a mask, a mask will be given to you at the Main Entrance upon arrival. For doctor visits, patients may have 1 support person age 52 or older with them. For treatment visits, patients can not have anyone with them due to social distancing guidelines and our immunocompromised population.

## 2021-05-29 DIAGNOSIS — G4733 Obstructive sleep apnea (adult) (pediatric): Secondary | ICD-10-CM

## 2021-05-30 ENCOUNTER — Ambulatory Visit (HOSPITAL_COMMUNITY)
Admission: RE | Admit: 2021-05-30 | Discharge: 2021-05-30 | Disposition: A | Discharge status: Home / Self Care | Payer: Medicare PPO | Source: Ambulatory Visit | Attending: Cardiology | Admitting: Cardiology

## 2021-05-30 ENCOUNTER — Encounter (HOSPITAL_COMMUNITY): Payer: Self-pay | Admitting: Cardiology

## 2021-05-30 VITALS — BP 120/60 | HR 73 | Wt 119.8 lb

## 2021-05-30 DIAGNOSIS — Z9641 Presence of insulin pump (external) (internal): Secondary | ICD-10-CM | POA: Diagnosis not present

## 2021-05-30 DIAGNOSIS — Z951 Presence of aortocoronary bypass graft: Secondary | ICD-10-CM | POA: Diagnosis not present

## 2021-05-30 DIAGNOSIS — R0989 Other specified symptoms and signs involving the circulatory and respiratory systems: Secondary | ICD-10-CM | POA: Insufficient documentation

## 2021-05-30 DIAGNOSIS — Y838 Other surgical procedures as the cause of abnormal reaction of the patient, or of later complication, without mention of misadventure at the time of the procedure: Secondary | ICD-10-CM | POA: Diagnosis not present

## 2021-05-30 DIAGNOSIS — I440 Atrioventricular block, first degree: Secondary | ICD-10-CM | POA: Insufficient documentation

## 2021-05-30 DIAGNOSIS — I130 Hypertensive heart and chronic kidney disease with heart failure and stage 1 through stage 4 chronic kidney disease, or unspecified chronic kidney disease: Secondary | ICD-10-CM | POA: Insufficient documentation

## 2021-05-30 DIAGNOSIS — Z8774 Personal history of (corrected) congenital malformations of heart and circulatory system: Secondary | ICD-10-CM | POA: Insufficient documentation

## 2021-05-30 DIAGNOSIS — Z7902 Long term (current) use of antithrombotics/antiplatelets: Secondary | ICD-10-CM | POA: Insufficient documentation

## 2021-05-30 DIAGNOSIS — Z953 Presence of xenogenic heart valve: Secondary | ICD-10-CM | POA: Insufficient documentation

## 2021-05-30 DIAGNOSIS — N184 Chronic kidney disease, stage 4 (severe): Secondary | ICD-10-CM | POA: Insufficient documentation

## 2021-05-30 DIAGNOSIS — I25110 Atherosclerotic heart disease of native coronary artery with unstable angina pectoris: Secondary | ICD-10-CM | POA: Insufficient documentation

## 2021-05-30 DIAGNOSIS — Z955 Presence of coronary angioplasty implant and graft: Secondary | ICD-10-CM | POA: Diagnosis not present

## 2021-05-30 DIAGNOSIS — Z9989 Dependence on other enabling machines and devices: Secondary | ICD-10-CM | POA: Diagnosis not present

## 2021-05-30 DIAGNOSIS — I340 Nonrheumatic mitral (valve) insufficiency: Secondary | ICD-10-CM | POA: Insufficient documentation

## 2021-05-30 DIAGNOSIS — I480 Paroxysmal atrial fibrillation: Secondary | ICD-10-CM | POA: Insufficient documentation

## 2021-05-30 DIAGNOSIS — Z9013 Acquired absence of bilateral breasts and nipples: Secondary | ICD-10-CM | POA: Insufficient documentation

## 2021-05-30 DIAGNOSIS — Z91048 Other nonmedicinal substance allergy status: Secondary | ICD-10-CM | POA: Insufficient documentation

## 2021-05-30 DIAGNOSIS — Z8249 Family history of ischemic heart disease and other diseases of the circulatory system: Secondary | ICD-10-CM | POA: Insufficient documentation

## 2021-05-30 DIAGNOSIS — G4733 Obstructive sleep apnea (adult) (pediatric): Secondary | ICD-10-CM | POA: Insufficient documentation

## 2021-05-30 DIAGNOSIS — I5032 Chronic diastolic (congestive) heart failure: Secondary | ICD-10-CM | POA: Insufficient documentation

## 2021-05-30 DIAGNOSIS — Z8571 Personal history of Hodgkin lymphoma: Secondary | ICD-10-CM | POA: Diagnosis not present

## 2021-05-30 DIAGNOSIS — T82855D Stenosis of coronary artery stent, subsequent encounter: Secondary | ICD-10-CM | POA: Diagnosis not present

## 2021-05-30 DIAGNOSIS — Z79899 Other long term (current) drug therapy: Secondary | ICD-10-CM | POA: Insufficient documentation

## 2021-05-30 DIAGNOSIS — E1022 Type 1 diabetes mellitus with diabetic chronic kidney disease: Secondary | ICD-10-CM | POA: Insufficient documentation

## 2021-05-30 DIAGNOSIS — I495 Sick sinus syndrome: Secondary | ICD-10-CM | POA: Diagnosis not present

## 2021-05-30 DIAGNOSIS — Z950 Presence of cardiac pacemaker: Secondary | ICD-10-CM | POA: Diagnosis not present

## 2021-05-30 DIAGNOSIS — D696 Thrombocytopenia, unspecified: Secondary | ICD-10-CM | POA: Insufficient documentation

## 2021-05-30 DIAGNOSIS — Z923 Personal history of irradiation: Secondary | ICD-10-CM | POA: Insufficient documentation

## 2021-05-30 DIAGNOSIS — D500 Iron deficiency anemia secondary to blood loss (chronic): Secondary | ICD-10-CM | POA: Diagnosis not present

## 2021-05-30 DIAGNOSIS — Z905 Acquired absence of kidney: Secondary | ICD-10-CM | POA: Insufficient documentation

## 2021-05-30 DIAGNOSIS — I425 Other restrictive cardiomyopathy: Secondary | ICD-10-CM | POA: Insufficient documentation

## 2021-05-30 DIAGNOSIS — Z7982 Long term (current) use of aspirin: Secondary | ICD-10-CM | POA: Insufficient documentation

## 2021-05-30 DIAGNOSIS — R002 Palpitations: Secondary | ICD-10-CM | POA: Insufficient documentation

## 2021-05-30 DIAGNOSIS — E785 Hyperlipidemia, unspecified: Secondary | ICD-10-CM | POA: Diagnosis not present

## 2021-05-30 LAB — LIPID PANEL
Cholesterol: 114 mg/dL (ref 0–200)
HDL: 37 mg/dL — ABNORMAL LOW (ref >40)
LDL Cholesterol: 26 mg/dL (ref 0–99)
Total CHOL/HDL Ratio: 3.1 RATIO
Triglycerides: 257 mg/dL — ABNORMAL HIGH (ref <150)
VLDL: 51 mg/dL — ABNORMAL HIGH (ref 0–40)

## 2021-05-30 LAB — BASIC METABOLIC PANEL
Anion gap: 11 (ref 5–15)
BUN: 81 mg/dL — ABNORMAL HIGH (ref 8–23)
CO2: 27 mmol/L (ref 22–32)
Calcium: 10 mg/dL (ref 8.9–10.3)
Chloride: 99 mmol/L (ref 98–111)
Creatinine, Ser: 2.09 mg/dL — ABNORMAL HIGH (ref 0.44–1.00)
GFR, Estimated: 25 mL/min — ABNORMAL LOW (ref >60)
Glucose, Bld: 171 mg/dL — ABNORMAL HIGH (ref 70–99)
Potassium: 4.7 mmol/L (ref 3.5–5.1)
Sodium: 137 mmol/L (ref 135–145)

## 2021-05-30 LAB — BRAIN NATRIURETIC PEPTIDE: B Natriuretic Peptide: 140.9 pg/mL — ABNORMAL HIGH (ref 0.0–100.0)

## 2021-05-30 MED ORDER — TORSEMIDE 20 MG PO TABS: 60.0000 mg | ORAL_TABLET | Freq: Every day | ORAL | 3 refills | Status: DC

## 2021-05-30 NOTE — Patient Instructions (Signed)
EKG done today.   Labs done today. We will contact you only if your labs are abnormal.  INCREASE Torsemide to 29m (3 tablets) by mouth daily.   No other medication changes were made. Please continue all current medications as prescribed.  Your physician recommends that you schedule a follow-up appointment in: 10 days for a lab only appointment and in 3 months with an echo prior to your exam.   If you have any questions or concerns before your next appointment please send [place] message through mIoneor call our office at [phone]    TO LEAVE A MESSAGE FOR THE NURSE SELECT OPTION 2, PLEASE LEAVE A MESSAGE INCLUDING: YOUR NAME DATE OF BIRTH CALL BACK NUMBER REASON FOR CALL**this is important as we prioritize the call backs  YOU WILL RECEIVE A CALL BACK THE SAME DAY AS LONG AS YOU CALL BEFORE 4:00 PM   Do the following things EVERYDAY: Weigh yourself in the morning before breakfast. Write it down and keep it in a log. Take your medicines as prescribed Eat low salt foods--Limit salt (sodium) to 2000 mg per day.  Stay as active as you can everyday Limit all fluids for the day to less than 2 liters   At the [facility] you and your health needs are our priority. As part of our continuing mission to provide you with exceptional heart care, we have created designated Provider Care Teams. These Care Teams include your primary Cardiologist (physician) and Advanced Practice Providers (APPs- Physician Assistants and Nurse Practitioners) who all work together to provide you with the care you need, when you need it.   You may see any of the following providers on your designated Care Team at your next follow up: Dr DGlori BickersDr DHaynes Kerns NP BLyda Jester PUtahLAudry Riles PharmD   Please be sure to bring in all your medications bottles to every appointment.

## 2021-05-31 ENCOUNTER — Other Ambulatory Visit (HOSPITAL_COMMUNITY): Payer: Self-pay | Admitting: *Deleted

## 2021-05-31 MED ORDER — METOLAZONE 2.5 MG PO TABS: ORAL_TABLET | ORAL | 0 refills | Status: DC

## 2021-05-31 NOTE — Progress Notes (Signed)
Date:  05/31/2021   ID:  [name] [name], DOB [birth year], MRN [id number]   Provider location: [place] Type of Visit: Established patient  PCP:  [name] Macadam, MD  Cardiologist:  None Primary HF: Dr. Aundra [name]   History of Present Illness: [name] [name] is a 70 y.o. female who has a history of CAD s/p CABG in 12/05 and redo in 1/11, bioprosthetic AVR, diastolic CHF, and sick sinus syndrome s/p PCM presents for cardiology followup.  She had initial CABG with SVG-RCA in 12/05 followed by redo SVG-RCA and bioprosthetic AVR in 1/11.  She has had diastolic CHF and is on Lasix.  In 9/14, she had a CPX test.  This showed severe functional limitation with ischemic ECG changes (inferolateral ST depression and ST elevation in V1 and V2). She had chest pain.  She had R/LHC in 9/14.  This showed elevated left and right heart filling pressures, patent SVG-PDA, and 80% distal LM/80% ostial LAD stenosis.  She was not a candidate for redo CABG (had 2 prior sternotomies as well as chest radiation for Hodgkins lymphoma).  She had DES from left main into proximal LAD and will need long-term DAPT => Brilinta was used as she has been a poor responder to Plavix in the past.  She re-developed angina in 11/15 and was admitted with unstable angina.  LHC showed 80% ostial LM and patent SVG-RCA. She had DES to ostial LM. Echo in 1/18 showed EF 60-65%, normal bioprosthetic aortic valve, mild-moderate MR, PASP 35 mmHg.    She has chronic iron deficiency anemia thought to be due to a slow GI bleed.  Last EGD was unremarkable and a capsule endoscopy was also unremarkable.  She has had periodic transfusions.     She had bilateral mastectomy for DCIS in 17/00 without complication.   She has an insulin pump followed by an endocrinologist at [facility].    Given myalgias with statins, she was started on Praluent.  LDL is now excellent.     In the past she has had short runs of atrial fibrillation.   She is not anticoagulated. Planned for Watchman placement, but given her nickel allergy, we were unable to place a Watchman.      Diagnosed with OSA, now on CPAP.    Given ongoing problems with exertional dyspnea and occasional chest pain, Cardiolite was repeated in 9/18 and showed inferior changes.  She underwent left/right heart cath in 9/18.  This showed elevated filling pressures, R>L.  There was 75+% ostial LCx stenosis.  However, there were 2 layers of stent from the mid left main into the ostial LAD, and LCx intervention was thought to be very risky.  It was decided to manage her medically.  Echo was repeated in 10/18 showing EF 55-60%, moderate to severe TR.     Given recurrent chest pain, she had ETT-Cardiolite done in 3/19.  This was a normal study with no evidence for ischemia or infarction.  Echo in 1/20 showed EF 55-60%, mild LVH, stable bioprosthetic aortic valve, moderate-severe TR.    Due to increased dyspnea and worsening renal function, she was set up for echo and RHC.  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  [place] in 8/20 showed primarily right-sided HF.  Cardiolite in 8/20  showed no ischemia or infarction.  Torsemide was increased and she cut back on sodium in her diet.    CPX in 11/20 showed severe functional limitation from HF.   She saw Dr. Mosetta Pigeon at [facility] for evaluation for heart/kidney transplant (for restrictive cardiomyopathy).  She was determined to be a poor surgical candidate due to extensive vascular calcifications.   CT chest in 8/21 showed a loculated right pleural effusion but unable to find enough fluid by ultrasound for thoracentesis.   Echo in 9/21 showed EF 60-65% with mild LVH, mild RV dilation with mildly decreased systolic  function and a D-shaped septum, PASP 40, normal bioprosthetic aortic valve, rheumatic-appearing MV with mild-moderate mitral stenosis (mean gradient 9 but MVA by PHT 2.7 cm^2), trivial MR.    She returns today for followup of CHF and CAD.  Weight is up 4 lbs.  Breathing is "up and down."  She is short of breath walking longer distances.  She tries to be careful with sodium.  Chronic orthopnea (sleeps at about 30 degrees). No chest pain.  She was worked up for parkinsonism by neurology, trial of Sinemet did not help and she felt terrible on this medication.     ECG (personally reviewed): NSR, 1st degree AVB, LVH   Labs (2/19): hgb 13.2 Labs @ [facility] (3/19): K 4.7, creatinine 1.4, ANA negative, RF negative Labs (4/19): K 3.9, creatinine 1.52 => 1.63 Labs (6/19): LDL 8, HDL 42, TGs 259 Labs (9/19): K 4.9, creatinine 1.65, hgb 13.4, plts 129, LDL 46,TGs 182 Labs (12/19): K 4.9, creatinine 2.07, hgb 12.4 Labs (3/20): K 4, creatinine 1.8 Labs (6/20): K 4.7, creatinine 1.47 Labs (8/20): creatinine 2.13 => 2.46 => 2.4, LDL 22 Labs (9/20): K 4.8, creatinine 2.74, AST/ALT normal, hgb 12.6 Labs (1/21): K 4.1, creatinine 1.86, hgb 11.7 Labs (4/21): K 4.7, creatinine 1.7 Labs (6/21): K 4.2, creatinine 2 => 1.7 Labs (8/21): K 3.8, creatinine 1.69, hgb 10.6, plts 116 Labs (9/21): LDL 34, TGs 209 Labs (1/22): K 3.8, creatinine 2.0 Labs (3/22): LDL 25 Labs (4/22): K 4.7, creatinine 2.0, hgb 12.3 Labs (5/22): K 4.4, creatinine 2.21 Labs (8/22): LDL 1, TGs 330 Labs (10/22): K 4.6, creatinine 1.87   PMH: 1. Sick sinus syndrome s/p [employer] PCM.  2. HTN 3. H/o SBO 4. Renal cell carcinoma s/p right nephrectomy in 2008.  5. 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  Fibroscan in 11/14 showed advanced fibrosis concerning for cirrhosis. EGD in 11/14 showed no varices.  6. Diastolic CHF: Echo (9/32) with EF 60-65%, bioprosthetic aortic valve with mean gradient 15 mmHg, mild MR.  CPX (9/14): peak VO2 10.4, VE/VCO2  43.8, inferolateral ST depression and ST elevation in V1/V2 with severe dyspnea and chest pain, severe functional limitation with ischemic changes.  Echo (9/14) with EF 60-65%, normal RV size and systolic function, mild-moderate MR, bioprosthetic aortic valve normal, PA systolic pressure 51 mmHg, no evidence for pericardial constriction though exam incomplete for constriction.  Echo (11/15) with EF 55-60%, bioprosthetic aortic valve, mild-moderate MR, moderate TR, PA systolic pressure 42 mmHg.  - TEE (5/17) with EF 60-65%, normal bioprosthetic aortic valve with mean gradient 13 mmHg, normal RV size and systolic function. - Echo (1/18) with EF 60-65%, normal bioprosthetic aortic valve, mild-moderate MR, PASP 35 mmHg.  - Echo (10/18) with EF 55-60%, bioprosthetic aortic valve mean gradient 10 mmHg, MAC with mild mitral stenosis (mean gradient 7) and mild mitral regurgitation, moderate to severe TR, PASP 43 mmHg.  - RHC (9/18): mean  RA 13, PA 44/18 mean 33, mean PCWP 19, CI 3.45, PVR 2.7 WU.  - Echo (1/20): EF 55-60%, mild LVH, mild MR, stable bioprosthetic aortic valve, moderate-severe TR.  - RHC (8/20): mean RA 14, PA 43/15 mean 28, mean PCWP 13, PAPI 2, PVR 4.2, CI 2.31 - Echo (8/20): EF 55-60%, RV mildly dilated with normal systolic function, mildly D-shaped septum suggestive of RV pressure/volume overload, rheumatic-appearing mitral valve with mild mitral stenosis (mean gradient 6, MVA by PHT 4 cm^2), no evidence for pericardial constriction, moderate TR, bioprosthetic aortic valve functioning normally with mean gradient 6 mmHg.  - CPX (11/20): peak VO2 9.8, VE/VCO2 slope 38, RER 1.11 => severe functional limitation from HF.   - Echo (9/21): EF 60-65% with mild LVH, mild RV dilation with mildly decreased systolic function and a D-shaped septum, PASP 40, normal bioprosthetic aortic valve, rheumatic-appearing MV with mild-moderate mitral stenosis (mean gradient 9 but MVA by PHT 2.7 cm^2, trivial MR.  7.  TAH-BSO 8. Type I diabetes: Has insulin pump.  9. H/o ischemic colitis. 10. H/o PUD. 11. Diabetic gastroparesis. 12. Hyperlipidemia: Myalgias with Crestor and atorvastatin, elevated LFTs with simvastatin.  Myalgias with > 10 mg pravastatin. Now on Praluent.  13. CKD stage IV: Sees Dr. Lorrene Reid 14. Bicuspid aortic valve s/p 19 mm Edwards pericardial valve in 1/11 (had post-operative Dresslers syndrome).   15. CAD: CABG with SVG-RCA in 12/05.  Redo SVG-RCA in 1/11 with AVR.  LHC/RHC (9/14) with mean RA 18, PA 62/25 mean 43, mean PCWP 26, CI 2.8, 70-80% distal LM stenosis, 80% ostial LAD stenosis, total occlusion RCA, SVG-PDA patent.  Patient had DES LM into LAD.  Plan to continue long-term ASA/Brilinta (poor responder to Plavix).  ETT-cardiolite (4/15) with 6' exercise, EF 60%, ST depression in recovery and mild chest pain, no ischemia or infarction by perfusion images.  Unstable angina 11/15 with 80% LM, patent SVG-RCA => DES to ostial LM.   - Lexiscan Cardiolite (2/18): EF 62%, no ischemia or infarction (normal).  She had a significant reaction to [place] and probably should not get again.  - Cardiolite (9/18): EF 72%, prior inferior infarct with mild peri-infarct ischemia.  - LHC (9/18): 2 layers stent left main - ostial LAD with 50% dLM in-stent restenosis, 75+% ostial LCx, totally occluded RCA, SVG-RCA patent.  Medical management planned.  - ETT-Cardiolite (4/19): EF normal, 7'11" exercise, no ischemia or infarction.  - Lexiscan Cardiolite (8/20): No ischemia/infarction.  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  17. Anemia: Iron-deficiency.  Suspect chronic GI blood loss.  EGD in 9/14 was unremarkable.  Capsule endoscopy 10/14 was unremarkable.  Has required periodic blood transfusions.  18. Atrial fibrillation: Paroxysmal, noted by [facility] interrogation.  19. Carotid stenosis: Carotid dopplers (2/15) with 40-59% bilateral stenosis. Carotid dopplers (3/16)  with 40-59% bilateral ICA stenosis. Carotid dopplers (3/17) with 40-59% bilateral ICA stenosis.  - Carotid dopplers (3/18) with mild BICA stenosis.  20. 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  22. C difficile colitis 2017 23. Gout 24. ?TIA: head CT negative.  25. OSA: Moderate, uses CPAP.  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 Hypercalcemia 28. 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  Mild-moderate on 9/21 echo.  29. Tricuspid regurgitation: Moderate by 8/20 echo.  30. Lung nodules: Followed at [facility].  31. ?Parkinsonism: She did not tolerate Sinemet.   Current Outpatient Medications  Medication Sig Dispense Refill   acyclovir ointment (ZOVIRAX) 5 % Apply 1 application topically every 3 (three) hours as needed (fever blister/cold sores).     ALPRAZolam (XANAX) 0.5 MG tablet Take 1 tablet (0.5 mg total) by mouth 2 (two) times daily as needed for anxiety. 60 tablet 1   APIDRA 100 UNIT/ML injection Inject 40 Units into the skin as directed. INFUSE THROUGH INSULIN PUMP UTD, Bolus depends on carb intake  2   aspirin EC 81 MG tablet Take 81 mg by mouth at bedtime.     BRILINTA 60 MG TABS tablet TAKE ONE TABLET (60MG TOTAL) BY MOUTH TWICE DAILY 180 tablet 3   cetirizine (ZYRTEC) 10 MG tablet Take 10 mg by mouth daily.     clobetasol (OLUX) 0.05 % topical foam Apply 1 application topically 2 (two) times daily as needed (skin flaking).     Coenzyme Q10 (CO Q 10) 100 MG CAPS Take 100 mg by mouth at bedtime.      colchicine 0.6 MG tablet Take 0.6 mg by mouth daily as needed (gout flare).     CONTOUR NEXT TEST test strip USE TO TEST BLOOD SUGAR UP TO EIGHT TIMES A DAY     denosumab (PROLIA) 60 MG/ML SOSY injection Inject 60 mg into the skin every 6 (six) months.     diclofenac Sodium (VOLTAREN) 1 % GEL as needed.     docusate sodium (COLACE) 100 MG capsule  Take 100 mg by mouth as needed for mild constipation.     famotidine (PEPCID) 40 MG tablet Take 40 mg by mouth daily. Takes a second tablet at night if needed.     glucagon (GLUCAGON EMERGENCY) 1 MG injection Inject 1 mg into the vein once as needed (low blood sugar).     icosapent Ethyl (VASCEPA) 1 g capsule Take 2 capsules (2 g total) by mouth 2 (two) times daily. 360 capsule 3   isosorbide mononitrate (IMDUR) 120 MG 24 hr tablet TAKE ONE TABLET (120MG TOTAL) BY MOUTH DAILY 90 tablet 3   latanoprost (XALATAN) 0.005 % ophthalmic solution Place 1 drop into both eyes at bedtime.     metolazone (ZAROXOLYN) 2.5 MG tablet Take 2.5 mg (1 tab) once as directed by CHF clinic. Call before taking [phone] 3 tablet 0   metoprolol tartrate (LOPRESSOR) 50 MG tablet Take 50 mg by mouth 2 (two) times daily.     mometasone (NASONEX) 50 MCG/ACT nasal spray Place into the nose. As needed     mupirocin cream (BACTROBAN) 2 % Apply 1 application topically 2 (two) times daily as needed. 90 g 0   nitroGLYCERIN (NITROLINGUAL) 0.4 MG/SPRAY spray PLACE 1 SPRAY UNDER THE TONGUE EVERY 5 MINUTES FOR 3 DOSES AS NEEDED FOR CHEST PAIN. 14.7 g 0   polyethylene glycol (MIRALAX / GLYCOLAX) 17 g packet Take 17 g by mouth daily.     pravastatin (PRAVACHOL) 10 MG tablet TAKE ONE TABLET (10 MG TOTAL) BY MOUTH DAILY. 90 tablet 3   REPATHA SURECLICK 546 MG/ML SOAJ INJECT 140MG INTO THE SKIN EVERY 14 DAYS 6 mL 3   rOPINIRole (REQUIP) 0.25 MG tablet Take 0.25 mg by mouth at bedtime.     silver nitrate applicators 27-03 % applicator Use as needed to stop superficial skin bleeding. 10 each 0   spironolactone (ALDACTONE) 25 MG tablet Take 0.5 tablets (12.5 mg total) by mouth daily. 45 tablet 3   ULORIC 40 MG tablet Take 1 tablet (40 mg total) by mouth at bedtime.  90 tablet 3   UNABLE TO FIND Med Name: So Clean CPAP cleaning system 1 each 0   Wheat Dextrin (BENEFIBER) POWD Take 1 Dose by mouth daily.      torsemide (DEMADEX) 20 MG  tablet Take 3 tablets (60 mg total) by mouth daily. 270 tablet 3   No current facility-administered medications for this encounter.    Allergies:   Avandia [rosiglitazone maleate], Cephalexin, Clindamycin, Clindamycin hcl, Humalog [insulin lispro], Lincomycin, Metformin, Novolog [insulin aspart], Penicillins, Rosiglitazone, Tizanidine, Adhesive [tape], Atorvastatin, Cholestyramine, Gentamycin [gentamicin], Iodinated diagnostic agents, Limonene, Prednisone, Ranexa [ranolazine], Rosuvastatin, Simvastatin, Strawberry extract, Azelastine, Clopidogrel, Lisinopril, Oseltamivir phosphate, Tizanidine hcl, Codeine, Erythromycin, Hydrocodone-acetaminophen, Latex, Neomycin, Nickel, Ranitidine, Tamiflu [oseltamivir], and Tramadol   Social History:  The patient  reports that she has never smoked. She has never used smokeless tobacco. She reports that she does not drink alcohol and does not use drugs.   Family History:  The patient's family history includes Anal fissures in her mother; Breast cancer (age of onset: 89) in her sister; Cancer (age of onset: 30) in her sister; Cancer (age of onset: 67) in her maternal grandfather; Diabetes in her father; Glaucoma in her brother; Heart attack in her father, maternal grandmother, and mother; Heart disease in her father and mother; Hypertension in her brother; Lung cancer in her sister; Parkinson's disease in her paternal aunt.   ROS:  Please see the history of present illness.   All other systems are personally reviewed and negative.   Exam:   BP 120/60   Pulse 73   Wt 54.3 kg (119 lb 12.8 oz)   SpO2 99%   BMI 21.91 kg/m  General: NAD Neck: JVP 9-10 cm, no thyromegaly or thyroid nodule.  Lungs: Clear to auscultation bilaterally with normal respiratory effort. CV: Nondisplaced PMI.  Heart regular S1/S2, no S3/S4, 1/6 SEM RUSB.  No peripheral edema.  No carotid bruit.  Normal pedal pulses.  Abdomen: Soft, nontender, no hepatosplenomegaly, no distention.  Skin:  Intact without lesions or rashes.  Neurologic: Alert and oriented x 3.  Psych: Normal affect. Extremities: No clubbing or cyanosis.  HEENT: Normal.   Recent Labs: 05/09/2021: ALT 8; Hemoglobin 12.5; Platelets 120 05/30/2021: B Natriuretic Peptide 140.9; BUN 81; Creatinine, Ser 2.09; Potassium 4.7; Sodium 137  Personally reviewed   Wt Readings from Last 3 Encounters:  05/30/21 54.3 kg (119 lb 12.8 oz)  05/22/21 53.7 kg (118 lb 4.8 oz)  03/15/21 52.6 kg (116 lb)    ASSESSMENT AND PLAN:  1. CAD: s/p SVG-RCA in 12/05, then redo SVG-RCA in 1/11--> distal left main/proximal LAD severe stenosis in 9/14.  PCI with DES to distal LM/proximal LAD was done rather than bypass as she would have been a poor candidate for redo sternotomy (2 prior sternotomies and history of chest wall radiation for Hodgkins disease).  Recurrent unstable angina with 80% ostial LM treated with repeat DES in 11/15.  Lexiscan Cardiolite in 9/18 inferior changes.  Repeat LHC (9/18) showed 50% distal left main in-stent restenosis and 75+% ostial LCx stenosis. I reviewed the films at the time with interventional cardiology => would be very difficult to intervene on the ostial LCx through 2 overlapping stents from left main into the LAD.  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 Given no ACS, we planned medical management and increased Imdur.  Cardiolite in 3/19 and again in 8/20 showed no ischemia or infarction.  No exertional chest pain.  - Given lack of symptoms  currently, would not plan functional study.  - Continue Brilinta 60 mg bid (poor responder to Plavix) long-term given recurrent left main disease.  - Continue ASA 81, statin/Praluent/Vascepa.  - Continue Imdur 120 mg daily . - Continue metoprolol 50 mg bid but may need to decrease/stop if she develops more advanced heart block (currently 1st degree AVB) and RV paces more (currently only rare).    - She was unable to tolerate ranolazine.   2. Bioprosthetic aortic valve replacement: In setting of bicuspid aortic valve, thoracic aorta not dilated on 2017 TEE.  Valve looked ok on 9/21 echo.   3. Chronic diastolic CHF: Restrictive cardiomyopathy.  Most recent echo in 9/21 shows normal LV systolic function, mildly dilated RV with mildly decreased systolic function, mild-moderate MS, normal bioprosthetic AoV, no evidence for pericardial constriction. RHC in 8/20 was concerning for right > left heart failure, she did appear to have equalization of diastolic pressures concerning for restrictive cardiomyopathy, possibly related to prior radiation. CPX in 11/20 showed severe HF limitation.  Balance of renal failure and RV failure has been difficult.  Creatinine 1.87 most recently.  She is volume overloaded on exam and weight is up.  NYHA class II-III.   - Increase torsemide to 60 mg daily with BMET/BNP today and BMET in 10 days.    - Continue spironolactone 12.5 mg daily.  - I had her evaluated at [facility] for heart/kidney transplant for CKD and restrictive cardiomyopathy, but extensive vascular calcification makes her a poor surgical candidate.  - No Jardiance/Farxiga given use of insulin pump.   - Repeat echo at followup in 3 months.  4. CKD:  Patient has a single kidney s/p right nephrectomy and suspected diabetic nephropathy.  CKD stage 4, slowly progressive.  Creatinine 1.87 last check. I have been concerned for cardiorenal syndrome in the setting of RV > LV failure/restrictive cardiomyopathy.   - Not candidate for PD given prior abdominal surgery.  - If CKD progresses, she may be a candidate for home HD.  - BMET today.  5. Hyperlipidemia: She has not been able to tolerate any higher potency statin than pravastatin 10 mg daily.   Good lipids in 8/22.  - Continue pravastatin.  - Continue Repatha.   - She is on Vascepa for elevated TGs.  6. Anemia: Chronic/slow GI bleed.  Unremarkable EGD and capsule endoscopy in fall 2014.  Unfortunately, needs  to stay on Brilinta long-term (Plavix non-responder).   7. Atrial fibrillation: Paroxysmal, very brief episodes noted on PCM interrogation. Given GI bleeding and need to be on Brilinta, risks have been thought to outweigh benefits of anticoagulation. She is not a good Multaq candidate with CHF.  If she needs an antiarrhythmic, consider Tikosyn or Sotalol but renal function may make these meds difficult to use.  She has occasional palpitations but nothing prolonged.   - We were unable to place Watchman device due to nickel allergy.   8. Carotid bruit: Only mild stenosis on last dopplers in 3/18.  9. SSS: Pacemaker in place.  10. HTN: controlled. Continue current regimen.   11. OSA: Moderate, using Bipap.   12. ?TIA: Transient dysarthria in 3/18, could have been afib-related TIA.  No recurrent symptoms.  13. Thrombocytopenia: Chronic and mild. Follows with hematology.  14. 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  Based on 8/20 RHC and 9/21 echo it appears mild-moderate.  She would not be a candidate for valve replacement.   Followup in 3 months with echo.   Signed, [name], MD  05/31/2021  [facility] [place] [address] [phone] (office) [phone] (fax)

## 2021-06-01 DIAGNOSIS — E1021 Type 1 diabetes mellitus with diabetic nephropathy: Secondary | ICD-10-CM | POA: Diagnosis not present

## 2021-06-12 ENCOUNTER — Ambulatory Visit (HOSPITAL_COMMUNITY)
Admission: RE | Admit: 2021-06-12 | Discharge: 2021-06-12 | Disposition: A | Discharge status: Home / Self Care | Payer: Medicare PPO | Source: Ambulatory Visit | Attending: Cardiology | Admitting: Cardiology

## 2021-06-12 DIAGNOSIS — I5032 Chronic diastolic (congestive) heart failure: Secondary | ICD-10-CM

## 2021-06-12 DIAGNOSIS — I5022 Chronic systolic (congestive) heart failure: Secondary | ICD-10-CM | POA: Diagnosis not present

## 2021-06-12 LAB — BASIC METABOLIC PANEL
Anion gap: 9 (ref 5–15)
BUN: 64 mg/dL — ABNORMAL HIGH (ref 8–23)
CO2: 30 mmol/L (ref 22–32)
Calcium: 9.9 mg/dL (ref 8.9–10.3)
Chloride: 99 mmol/L (ref 98–111)
Creatinine, Ser: 2.4 mg/dL — ABNORMAL HIGH (ref 0.44–1.00)
GFR, Estimated: 21 mL/min — ABNORMAL LOW (ref >60)
Glucose, Bld: 191 mg/dL — ABNORMAL HIGH (ref 70–99)
Potassium: 4.3 mmol/L (ref 3.5–5.1)
Sodium: 138 mmol/L (ref 135–145)

## 2021-06-14 ENCOUNTER — Encounter (HOSPITAL_COMMUNITY): Payer: Self-pay | Admitting: Cardiology

## 2021-06-19 DIAGNOSIS — E041 Nontoxic single thyroid nodule: Secondary | ICD-10-CM | POA: Diagnosis not present

## 2021-06-19 DIAGNOSIS — Z9641 Presence of insulin pump (external) (internal): Secondary | ICD-10-CM | POA: Diagnosis not present

## 2021-06-19 DIAGNOSIS — E1021 Type 1 diabetes mellitus with diabetic nephropathy: Secondary | ICD-10-CM | POA: Diagnosis not present

## 2021-06-21 ENCOUNTER — Other Ambulatory Visit (HOSPITAL_COMMUNITY): Payer: Self-pay | Admitting: *Deleted

## 2021-06-21 DIAGNOSIS — I5032 Chronic diastolic (congestive) heart failure: Secondary | ICD-10-CM

## 2021-06-22 ENCOUNTER — Ambulatory Visit: Payer: Medicare PPO | Admitting: Neurology

## 2021-06-22 ENCOUNTER — Ambulatory Visit (HOSPITAL_COMMUNITY)
Admission: RE | Admit: 2021-06-22 | Discharge: 2021-06-22 | Disposition: A | Discharge status: Home / Self Care | Payer: Medicare PPO | Source: Ambulatory Visit | Attending: Cardiology | Admitting: Cardiology

## 2021-06-22 ENCOUNTER — Encounter: Payer: Self-pay | Admitting: Neurology

## 2021-06-22 VITALS — BP 138/59 | HR 67 | Ht 62.0 in | Wt 118.3 lb

## 2021-06-22 DIAGNOSIS — G2581 Restless legs syndrome: Secondary | ICD-10-CM

## 2021-06-22 DIAGNOSIS — G20 Parkinson's disease: Secondary | ICD-10-CM

## 2021-06-22 DIAGNOSIS — I5032 Chronic diastolic (congestive) heart failure: Secondary | ICD-10-CM | POA: Insufficient documentation

## 2021-06-22 LAB — BASIC METABOLIC PANEL
Anion gap: 9 (ref 5–15)
BUN: 72 mg/dL — ABNORMAL HIGH (ref 8–23)
CO2: 27 mmol/L (ref 22–32)
Calcium: 10 mg/dL (ref 8.9–10.3)
Chloride: 101 mmol/L (ref 98–111)
Creatinine, Ser: 2.25 mg/dL — ABNORMAL HIGH (ref 0.44–1.00)
GFR, Estimated: 23 mL/min — ABNORMAL LOW (ref >60)
Glucose, Bld: 228 mg/dL — ABNORMAL HIGH (ref 70–99)
Potassium: 4.1 mmol/L (ref 3.5–5.1)
Sodium: 137 mmol/L (ref 135–145)

## 2021-06-22 NOTE — Progress Notes (Signed)
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 sleep apnea, on BiPAP therapy, pacemaker, paroxysmal A. fib, small bowel obstruction, NASH, history of syncope, status post multiple surgeries including CABG, AVR, loop recorder placement, left mastectomy, right nephrectomy, Hodgkin's lymphoma surgery, pacemaker placement, small bowel lipoma resection, who presents for follow-up consultation of her restless leg syndrome.  She is well treated with low-dose ropinirole at this time.  She is adjusted well to the dose and most of the time takes 0.25 mg at night, sometimes 0.5 mg if she is in a car for long period of time.  She is advised to continue with this medication through her primary care.  She has had other symptoms including balance issues, tremors intermittently and coordination problems as well as slowness and changes in her handwriting and speech.  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  We mutually agreed to continue to monitor her.  She tried levodopa in low-dose but could not tolerate the medication secondary to several possible side effects  including sedation primarily.  She is advised to continue follow-up with her other specialists and I will check in with her in about 6 months, sooner if needed.  We talked about the importance of strengthening exercises and maintaining a healthy lifestyle, good nutrition, good hydration, the importance of sleep.  She feels that her sleep quality and daytime somnolence as well as daytime energy actually improved recently after she had an adjustment to her BiPAP through her cardiologist.  I answered all her questions today and she was in agreement with our plan.  I spent 30 minutes in total face-to-face time and in reviewing records during pre-charting, more than 50% of which was spent in counseling and coordination of care, reviewing test results, reviewing medications and treatment regimen and/or in discussing or reviewing the diagnosis of parkinsonism, RLS, the prognosis and treatment options. Pertinent laboratory and imaging test results that were available during this visit with the patient were reviewed by me and considered in my medical decision making (see chart for details).

## 2021-06-22 NOTE — Patient Instructions (Signed)
It was nice to see you again today.  I am sorry to hear that the levodopa caused you side effects.  We will continue to monitor your symptoms and examination.  I am glad your energy level and daytime sleepiness are a little better after you had an adjustment in your BiPAP.  Thankfully your restless leg symptoms are also stable on a low-dose of ropinirole.  Please follow-up routinely to see me in about 6 months.  We can keep in touch via [employer].

## 2021-07-03 ENCOUNTER — Telehealth (HOSPITAL_COMMUNITY): Payer: Self-pay

## 2021-07-03 NOTE — Telephone Encounter (Signed)
Patients parking placard form filled out and signed by Dr. Aundra Dubin. Patient made aware and will pick it up tomorrow. Form left up front at checkout, a copy will be scanned into patients chart as well.

## 2021-07-04 ENCOUNTER — Ambulatory Visit (INDEPENDENT_AMBULATORY_CARE_PROVIDER_SITE_OTHER): Payer: Medicare PPO

## 2021-07-04 DIAGNOSIS — Z853 Personal history of malignant neoplasm of breast: Secondary | ICD-10-CM | POA: Diagnosis not present

## 2021-07-04 DIAGNOSIS — I495 Sick sinus syndrome: Secondary | ICD-10-CM | POA: Diagnosis not present

## 2021-07-04 DIAGNOSIS — N649 Disorder of breast, unspecified: Secondary | ICD-10-CM | POA: Diagnosis not present

## 2021-07-04 DIAGNOSIS — J069 Acute upper respiratory infection, unspecified: Secondary | ICD-10-CM | POA: Diagnosis not present

## 2021-07-05 LAB — CUP PACEART REMOTE DEVICE CHECK
Battery Impedance: 786 Ohm
Battery Remaining Longevity: 81 mo
Battery Voltage: 2.79 V
Brady Statistic AP VP Percent: 0 %
Brady Statistic AP VS Percent: 1 %
Brady Statistic AS VP Percent: 0 %
Brady Statistic AS VS Percent: 99 %
Date Time Interrogation Session: 20221213184454
Implantable Lead Implant Date: 20140130
Implantable Lead Implant Date: 20140130
Implantable Lead Location: 753859
Implantable Lead Location: 753860
Implantable Lead Model: 5076
Implantable Lead Model: 5076
Implantable Pulse Generator Implant Date: 20140130
Lead Channel Impedance Value: 495 Ohm
Lead Channel Impedance Value: 629 Ohm
Lead Channel Pacing Threshold Amplitude: 0.5 V
Lead Channel Pacing Threshold Amplitude: 0.875 V
Lead Channel Pacing Threshold Pulse Width: 0.4 ms
Lead Channel Pacing Threshold Pulse Width: 0.4 ms
Lead Channel Setting Pacing Amplitude: 1.75 V
Lead Channel Setting Pacing Amplitude: 2.5 V
Lead Channel Setting Pacing Pulse Width: 0.4 ms
Lead Channel Setting Sensing Sensitivity: 5.6 mV

## 2021-07-05 NOTE — Telephone Encounter (Addendum)
Order placed to [facility] via fax.

## 2021-07-05 NOTE — Telephone Encounter (Signed)
The only order I see if for a pressure change which was done on 11/17 and then a 2-week download. At the last download her AHI was 20.9 and two weeks later she is at 14.7.  [name] [name], [name] [name] 06/04/2021 - 07/03/2021 Patient ID: [id number] DOB: August 20, 1950 Age: 70 years [name], [name] Phone: [phone] Compliance Report Compliance Payor Standard Usage 06/04/2021 - 07/03/2021 Usage days 30/30 days (100%) >= 4 hours 30 days (100%) < 4 hours 0 days (0%) Usage hours 248 hours 12 minutes Average usage (total days) 8 hours 16 minutes Average usage (days used) 8 hours 16 minutes Median usage (days used) 8 hours 5 minutes Total used hours (value since last reset - 07/03/2021) 4,500 hours AirCurve 10 VAuto Serial number [id number] Mode VAuto Max IPAP 20 cmH2O Min EPAP 5 cmH2O Pressure Support 4 cmH2O Therapy Leaks - L/min Median: 0.0 95th percentile: 5.1 Maximum: 21.6 Events per hour AI: 14.5 HI: 0.2 AHI: 14.7 Apnea Index Central: 13.8 Obstructive: 0.6 Unknown: 0.1 Usage - hours Printed on 07/05/2021 - [employer] AirView version 4.39.0-4.0 Page 1 of

## 2021-07-05 NOTE — Addendum Note (Signed)
Addended by: Freada Bergeron on: 07/05/2021 01:31 PM   Modules accepted: Orders

## 2021-07-07 ENCOUNTER — Encounter: Payer: Self-pay | Admitting: Internal Medicine

## 2021-07-14 NOTE — Progress Notes (Signed)
Remote pacemaker transmission.   

## 2021-07-21 ENCOUNTER — Other Ambulatory Visit (HOSPITAL_COMMUNITY): Payer: Self-pay | Admitting: Cardiology

## 2021-07-26 DIAGNOSIS — N6001 Solitary cyst of right breast: Secondary | ICD-10-CM | POA: Diagnosis not present

## 2021-07-26 DIAGNOSIS — N6311 Unspecified lump in the right breast, upper outer quadrant: Secondary | ICD-10-CM | POA: Diagnosis not present

## 2021-07-26 DIAGNOSIS — N6452 Nipple discharge: Secondary | ICD-10-CM | POA: Diagnosis not present

## 2021-08-17 DIAGNOSIS — Q188 Other specified congenital malformations of face and neck: Secondary | ICD-10-CM | POA: Diagnosis not present

## 2021-08-18 DIAGNOSIS — I4891 Unspecified atrial fibrillation: Secondary | ICD-10-CM | POA: Diagnosis not present

## 2021-08-18 DIAGNOSIS — I2510 Atherosclerotic heart disease of native coronary artery without angina pectoris: Secondary | ICD-10-CM | POA: Diagnosis not present

## 2021-08-18 DIAGNOSIS — E785 Hyperlipidemia, unspecified: Secondary | ICD-10-CM | POA: Diagnosis not present

## 2021-08-18 DIAGNOSIS — E1122 Type 2 diabetes mellitus with diabetic chronic kidney disease: Secondary | ICD-10-CM | POA: Diagnosis not present

## 2021-08-18 DIAGNOSIS — N184 Chronic kidney disease, stage 4 (severe): Secondary | ICD-10-CM | POA: Diagnosis not present

## 2021-08-18 DIAGNOSIS — D649 Anemia, unspecified: Secondary | ICD-10-CM | POA: Diagnosis not present

## 2021-08-22 DIAGNOSIS — E1021 Type 1 diabetes mellitus with diabetic nephropathy: Secondary | ICD-10-CM | POA: Diagnosis not present

## 2021-08-28 DIAGNOSIS — R310 Gross hematuria: Secondary | ICD-10-CM | POA: Diagnosis not present

## 2021-08-28 DIAGNOSIS — N281 Cyst of kidney, acquired: Secondary | ICD-10-CM | POA: Diagnosis not present

## 2021-08-31 ENCOUNTER — Encounter (HOSPITAL_COMMUNITY): Payer: Self-pay | Admitting: Cardiology

## 2021-08-31 ENCOUNTER — Other Ambulatory Visit: Payer: Self-pay

## 2021-08-31 ENCOUNTER — Ambulatory Visit (HOSPITAL_COMMUNITY)
Admission: RE | Admit: 2021-08-31 | Discharge: 2021-08-31 | Disposition: A | Discharge status: Home / Self Care | Payer: Medicare PPO | Source: Ambulatory Visit | Attending: Cardiology | Admitting: Cardiology

## 2021-08-31 ENCOUNTER — Ambulatory Visit (HOSPITAL_BASED_OUTPATIENT_CLINIC_OR_DEPARTMENT_OTHER)
Admission: RE | Admit: 2021-08-31 | Discharge: 2021-08-31 | Disposition: A | Discharge status: Home / Self Care | Payer: Medicare PPO | Source: Ambulatory Visit | Attending: Cardiology | Admitting: Cardiology

## 2021-08-31 VITALS — BP 110/60 | HR 64 | Wt 119.4 lb

## 2021-08-31 DIAGNOSIS — N184 Chronic kidney disease, stage 4 (severe): Secondary | ICD-10-CM | POA: Insufficient documentation

## 2021-08-31 DIAGNOSIS — Z91048 Other nonmedicinal substance allergy status: Secondary | ICD-10-CM | POA: Diagnosis not present

## 2021-08-31 DIAGNOSIS — I340 Nonrheumatic mitral (valve) insufficiency: Secondary | ICD-10-CM | POA: Insufficient documentation

## 2021-08-31 DIAGNOSIS — G4733 Obstructive sleep apnea (adult) (pediatric): Secondary | ICD-10-CM | POA: Diagnosis not present

## 2021-08-31 DIAGNOSIS — I495 Sick sinus syndrome: Secondary | ICD-10-CM | POA: Insufficient documentation

## 2021-08-31 DIAGNOSIS — I440 Atrioventricular block, first degree: Secondary | ICD-10-CM | POA: Insufficient documentation

## 2021-08-31 DIAGNOSIS — I425 Other restrictive cardiomyopathy: Secondary | ICD-10-CM | POA: Insufficient documentation

## 2021-08-31 DIAGNOSIS — E785 Hyperlipidemia, unspecified: Secondary | ICD-10-CM | POA: Insufficient documentation

## 2021-08-31 DIAGNOSIS — Z79899 Other long term (current) drug therapy: Secondary | ICD-10-CM | POA: Diagnosis not present

## 2021-08-31 DIAGNOSIS — Z952 Presence of prosthetic heart valve: Secondary | ICD-10-CM | POA: Diagnosis not present

## 2021-08-31 DIAGNOSIS — I5032 Chronic diastolic (congestive) heart failure: Secondary | ICD-10-CM | POA: Diagnosis not present

## 2021-08-31 DIAGNOSIS — Z923 Personal history of irradiation: Secondary | ICD-10-CM | POA: Diagnosis not present

## 2021-08-31 DIAGNOSIS — Z950 Presence of cardiac pacemaker: Secondary | ICD-10-CM | POA: Diagnosis not present

## 2021-08-31 DIAGNOSIS — Z9641 Presence of insulin pump (external) (internal): Secondary | ICD-10-CM | POA: Insufficient documentation

## 2021-08-31 DIAGNOSIS — I480 Paroxysmal atrial fibrillation: Secondary | ICD-10-CM | POA: Insufficient documentation

## 2021-08-31 DIAGNOSIS — I130 Hypertensive heart and chronic kidney disease with heart failure and stage 1 through stage 4 chronic kidney disease, or unspecified chronic kidney disease: Secondary | ICD-10-CM | POA: Insufficient documentation

## 2021-08-31 DIAGNOSIS — E1043 Type 1 diabetes mellitus with diabetic autonomic (poly)neuropathy: Secondary | ICD-10-CM | POA: Insufficient documentation

## 2021-08-31 DIAGNOSIS — Z905 Acquired absence of kidney: Secondary | ICD-10-CM | POA: Diagnosis not present

## 2021-08-31 DIAGNOSIS — E1022 Type 1 diabetes mellitus with diabetic chronic kidney disease: Secondary | ICD-10-CM | POA: Diagnosis not present

## 2021-08-31 DIAGNOSIS — Z85528 Personal history of other malignant neoplasm of kidney: Secondary | ICD-10-CM | POA: Insufficient documentation

## 2021-08-31 DIAGNOSIS — D696 Thrombocytopenia, unspecified: Secondary | ICD-10-CM | POA: Insufficient documentation

## 2021-08-31 DIAGNOSIS — Z7982 Long term (current) use of aspirin: Secondary | ICD-10-CM | POA: Insufficient documentation

## 2021-08-31 DIAGNOSIS — Z794 Long term (current) use of insulin: Secondary | ICD-10-CM | POA: Insufficient documentation

## 2021-08-31 DIAGNOSIS — Z955 Presence of coronary angioplasty implant and graft: Secondary | ICD-10-CM | POA: Insufficient documentation

## 2021-08-31 DIAGNOSIS — I3481 Nonrheumatic mitral (valve) annulus calcification: Secondary | ICD-10-CM | POA: Insufficient documentation

## 2021-08-31 DIAGNOSIS — Z951 Presence of aortocoronary bypass graft: Secondary | ICD-10-CM | POA: Insufficient documentation

## 2021-08-31 DIAGNOSIS — Z853 Personal history of malignant neoplasm of breast: Secondary | ICD-10-CM | POA: Insufficient documentation

## 2021-08-31 DIAGNOSIS — R0989 Other specified symptoms and signs involving the circulatory and respiratory systems: Secondary | ICD-10-CM | POA: Diagnosis not present

## 2021-08-31 DIAGNOSIS — D509 Iron deficiency anemia, unspecified: Secondary | ICD-10-CM | POA: Insufficient documentation

## 2021-08-31 DIAGNOSIS — M791 Myalgia, unspecified site: Secondary | ICD-10-CM | POA: Insufficient documentation

## 2021-08-31 DIAGNOSIS — Z8571 Personal history of Hodgkin lymphoma: Secondary | ICD-10-CM | POA: Insufficient documentation

## 2021-08-31 DIAGNOSIS — Q231 Congenital insufficiency of aortic valve: Secondary | ICD-10-CM | POA: Insufficient documentation

## 2021-08-31 DIAGNOSIS — I342 Nonrheumatic mitral (valve) stenosis: Secondary | ICD-10-CM | POA: Insufficient documentation

## 2021-08-31 DIAGNOSIS — Z9013 Acquired absence of bilateral breasts and nipples: Secondary | ICD-10-CM | POA: Insufficient documentation

## 2021-08-31 DIAGNOSIS — I25110 Atherosclerotic heart disease of native coronary artery with unstable angina pectoris: Secondary | ICD-10-CM | POA: Diagnosis not present

## 2021-08-31 DIAGNOSIS — J90 Pleural effusion, not elsewhere classified: Secondary | ICD-10-CM | POA: Insufficient documentation

## 2021-08-31 LAB — ECHOCARDIOGRAM COMPLETE
AR max vel: 1.02 cm2
AV Area VTI: 1.02 cm2
AV Area mean vel: 1.04 cm2
AV Mean grad: 6 mmHg
AV Peak grad: 10.4 mmHg
Ao pk vel: 1.62 m/s
Area-P 1/2: 2.61 cm2
Calc EF: 60.3 %
MV M vel: 4.2 m/s
MV Peak grad: 70.6 mmHg
MV VTI: 0.84 cm2
S' Lateral: 2.5 cm
Single Plane A2C EF: 59.2 %
Single Plane A4C EF: 61 %

## 2021-08-31 LAB — BASIC METABOLIC PANEL
Anion gap: 12 (ref 5–15)
BUN: 75 mg/dL — ABNORMAL HIGH (ref 8–23)
CO2: 23 mmol/L (ref 22–32)
Calcium: 10.1 mg/dL (ref 8.9–10.3)
Chloride: 102 mmol/L (ref 98–111)
Creatinine, Ser: 2.56 mg/dL — ABNORMAL HIGH (ref 0.44–1.00)
GFR, Estimated: 20 mL/min — ABNORMAL LOW (ref >60)
Glucose, Bld: 153 mg/dL — ABNORMAL HIGH (ref 70–99)
Potassium: 4.4 mmol/L (ref 3.5–5.1)
Sodium: 137 mmol/L (ref 135–145)

## 2021-08-31 LAB — CBC
HCT: 34.9 % — ABNORMAL LOW (ref 36.0–46.0)
Hemoglobin: 11.7 g/dL — ABNORMAL LOW (ref 12.0–15.0)
MCH: 30.5 pg (ref 26.0–34.0)
MCHC: 33.5 g/dL (ref 30.0–36.0)
MCV: 90.9 fL (ref 80.0–100.0)
Platelets: 125 10*3/uL — ABNORMAL LOW (ref 150–400)
RBC: 3.84 MIL/uL — ABNORMAL LOW (ref 3.87–5.11)
RDW: 13.6 % (ref 11.5–15.5)
WBC: 10.4 10*3/uL (ref 4.0–10.5)
nRBC: 0 % (ref 0.0–0.2)

## 2021-08-31 MED ORDER — METOPROLOL TARTRATE 25 MG PO TABS: 25.0000 mg | ORAL_TABLET | Freq: Two times a day (BID) | ORAL | 11 refills | Status: DC

## 2021-08-31 NOTE — Patient Instructions (Signed)
Thank you for your visit today.  There has been no changes to your medication.   Labs done today, your results will be available in MyChart, we will contact you for abnormal readings.   Your physician recommends that you schedule a follow-up appointment in: 2 months.   If you have any questions or concerns before your next appointment please send [name] a message through [employer] or call our office at [phone].    TO LEAVE A MESSAGE FOR THE NURSE SELECT OPTION 2, PLEASE LEAVE A MESSAGE INCLUDING: YOUR NAME DATE OF BIRTH CALL BACK NUMBER REASON FOR CALL**this is important as we prioritize the call backs  YOU WILL RECEIVE A CALL BACK THE SAME DAY AS LONG AS YOU CALL BEFORE 4:00 PM  At the [facility], you and your health needs are our priority. As part of our continuing mission to provide you with exceptional heart care, we have created designated Provider Care Teams. These Care Teams include your primary Cardiologist (physician) and Advanced Practice Providers (APPs- Physician Assistants and Nurse Practitioners) who all work together to provide you with the care you need, when you need it.   You may see any of the following providers on your designated Care Team at your next follow up: Dr Glori Bickers Dr Haynes Kerns, NP Lyda Jester, [place] [facility] [place], [place] Audry Riles, PharmD   Please be sure to bring in all your medications bottles to every appointment.

## 2021-08-31 NOTE — Progress Notes (Signed)
Date:  08/31/2021   ID:  [name] [name], DOB [birth year], MRN [id number]   Provider location: [place] Type of Visit: Established patient  PCP:  [name], MD  Cardiologist:  None Primary HF: Dr. Aundra Dubin   History of Present Illness: [name] [name] is a 71 y.o. female who has a history of CAD s/p CABG in 12/05 and redo in 1/11, bioprosthetic AVR, diastolic CHF, and sick sinus syndrome s/p PCM presents for cardiology followup.  She had initial CABG with SVG-RCA in 12/05 followed by redo SVG-RCA and bioprosthetic AVR in 1/11.  She has had diastolic CHF and is on Lasix.  In 9/14, she had a CPX test.  This showed severe functional limitation with ischemic ECG changes (inferolateral ST depression and ST elevation in V1 and V2). She had chest pain.  She had R/LHC in 9/14.  This showed elevated left and right heart filling pressures, patent SVG-PDA, and 80% distal LM/80% ostial LAD stenosis.  She was not a candidate for redo CABG (had 2 prior sternotomies as well as chest radiation for Hodgkins lymphoma).  She had DES from left main into proximal LAD and will need long-term DAPT => Brilinta was used as she has been a poor responder to Plavix in the past.  She re-developed angina in 11/15 and was admitted with unstable angina.  LHC showed 80% ostial LM and patent SVG-RCA. She had DES to ostial LM. Echo in 1/18 showed EF 60-65%, normal bioprosthetic aortic valve, mild-moderate MR, PASP 35 mmHg.    She has chronic iron deficiency anemia thought to be due to a slow GI bleed.  Last EGD was unremarkable and a capsule endoscopy was also unremarkable.  She has had periodic transfusions.     She had bilateral mastectomy for DCIS in 85/27 without complication.   She has an insulin pump followed by an endocrinologist at [facility].    Given myalgias with statins, she was started on Praluent.  LDL is now excellent.     In the past she has had short runs of atrial fibrillation.  She  is not anticoagulated. Planned for Watchman placement, but given her nickel allergy, we were unable to place a Watchman.      Diagnosed with OSA, now on CPAP.    Given ongoing problems with exertional dyspnea and occasional chest pain, Cardiolite was repeated in 9/18 and showed inferior changes.  She underwent left/right heart cath in 9/18.  This showed elevated filling pressures, R>L.  There was 75+% ostial LCx stenosis.  However, there were 2 layers of stent from the mid left main into the ostial LAD, and LCx intervention was thought to be very risky.  It was decided to manage her medically.  Echo was repeated in 10/18 showing EF 55-60%, moderate to severe TR.     Given recurrent chest pain, she had ETT-Cardiolite done in 3/19.  This was a normal study with no evidence for ischemia or infarction.  Echo in 1/20 showed EF 55-60%, mild LVH, stable bioprosthetic aortic valve, moderate-severe TR.    Due to increased dyspnea and worsening renal function, she was set up for echo and RHC.  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  [place] in 8/20 showed primarily right-sided HF.  Cardiolite in 8/20  showed no ischemia or infarction.  Torsemide was increased and she cut back on sodium in her diet.    CPX in 11/20 showed severe functional limitation from HF.   She saw Dr. Mosetta Pigeon at [facility] for evaluation for heart/kidney transplant (for restrictive cardiomyopathy).  She was determined to be a poor surgical candidate due to extensive vascular calcifications.   CT chest in 8/21 showed a loculated right pleural effusion but unable to find enough fluid by ultrasound for thoracentesis.   Echo in 9/21 showed EF 60-65% with mild LVH, mild RV dilation with mildly decreased systolic function  and a D-shaped septum, PASP 40, normal bioprosthetic aortic valve, rheumatic-appearing MV with mild-moderate mitral stenosis (mean gradient 9 but MVA by PHT 2.7 cm^2), trivial MR.  Echo was done today and reviewed, EF 60-65%, mild LVH, normal RV systolic function but with D-shaped septum, normal bioprosthetic aortic valve, mild mitral stenosis (rheumatic-appearing valve, mean gradient 4 mmHg), mild MR.    She returns today for followup of CHF and CAD.  Weight is stable.  Creatinine recently noted to be higher at 2.65 on labs done by her nephrologist. She is stable symptomatically, short of breath with stairs/inclines and if she walks too fast.  No orthopnea/PND.  No chest pain.  No BRPBR/melena.      ECG (personally reviewed): NSR, 1st degree AVB, LVH   Labs (2/19): hgb 13.2 Labs @ [facility] (3/19): K 4.7, creatinine 1.4, ANA negative, RF negative Labs (4/19): K 3.9, creatinine 1.52 => 1.63 Labs (6/19): LDL 8, HDL 42, TGs 259 Labs (9/19): K 4.9, creatinine 1.65, hgb 13.4, plts 129, LDL 46,TGs 182 Labs (12/19): K 4.9, creatinine 2.07, hgb 12.4 Labs (3/20): K 4, creatinine 1.8 Labs (6/20): K 4.7, creatinine 1.47 Labs (8/20): creatinine 2.13 => 2.46 => 2.4, LDL 22 Labs (9/20): K 4.8, creatinine 2.74, AST/ALT normal, hgb 12.6 Labs (1/21): K 4.1, creatinine 1.86, hgb 11.7 Labs (4/21): K 4.7, creatinine 1.7 Labs (6/21): K 4.2, creatinine 2 => 1.7 Labs (8/21): K 3.8, creatinine 1.69, hgb 10.6, plts 116 Labs (9/21): LDL 34, TGs 209 Labs (1/22): K 3.8, creatinine 2.0 Labs (3/22): LDL 25 Labs (4/22): K 4.7, creatinine 2.0, hgb 12.3 Labs (5/22): K 4.4, creatinine 2.21 Labs (8/22): LDL 1, TGs 330 Labs (10/22): K 4.6, creatinine 1.87 Labs (11/22): LDL 26 Labs (12/22): K 4.1, creatinine 2.25 Labs (1/23): creatinine 2.65   PMH: 1. Sick sinus syndrome s/p [employer] PCM.  2. HTN 3. H/o SBO 4. Renal cell carcinoma s/p right nephrectomy in 2008.  5. 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  Fibroscan in  11/14 showed advanced fibrosis concerning for cirrhosis. EGD in 11/14 showed no varices.  6. Diastolic CHF: Echo (3/78) with EF 60-65%, bioprosthetic aortic valve with mean gradient 15 mmHg, mild MR.  CPX (9/14): peak VO2 10.4, VE/VCO2 43.8, inferolateral ST depression and ST elevation in V1/V2 with severe dyspnea and chest pain, severe functional limitation with ischemic changes.  Echo (9/14) with EF 60-65%, normal RV size and systolic function, mild-moderate MR, bioprosthetic aortic valve normal, PA systolic pressure 51 mmHg, no evidence for pericardial constriction though exam incomplete for constriction.  Echo (11/15) with EF 55-60%, bioprosthetic aortic valve, mild-moderate MR, moderate TR, PA systolic pressure 42 mmHg.  - TEE (5/17) with EF 60-65%, normal bioprosthetic aortic valve with mean gradient 13 mmHg, normal RV size and systolic function. - Echo (1/18) with EF 60-65%, normal bioprosthetic aortic valve, mild-moderate MR, PASP 35 mmHg.  - Echo (10/18) with EF 55-60%,  bioprosthetic aortic valve mean gradient 10 mmHg, MAC with mild mitral stenosis (mean gradient 7) and mild mitral regurgitation, moderate to severe TR, PASP 43 mmHg.  - RHC (9/18): mean RA 13, PA 44/18 mean 33, mean PCWP 19, CI 3.45, PVR 2.7 WU.  - Echo (1/20): EF 55-60%, mild LVH, mild MR, stable bioprosthetic aortic valve, moderate-severe TR.  - RHC (8/20): mean RA 14, PA 43/15 mean 28, mean PCWP 13, PAPI 2, PVR 4.2, CI 2.31 - Echo (8/20): EF 55-60%, RV mildly dilated with normal systolic function, mildly D-shaped septum suggestive of RV pressure/volume overload, rheumatic-appearing mitral valve with mild mitral stenosis (mean gradient 6, MVA by PHT 4 cm^2), no evidence for pericardial constriction, moderate TR, bioprosthetic aortic valve functioning normally with mean gradient 6 mmHg.  - CPX (11/20): peak VO2 9.8, VE/VCO2 slope 38, RER 1.11 => severe functional limitation from HF.   - Echo (9/21): EF 60-65% with mild LVH, mild  RV dilation with mildly decreased systolic function and a D-shaped septum, PASP 40, normal bioprosthetic aortic valve, rheumatic-appearing MV with mild-moderate mitral stenosis (mean gradient 9 but MVA by PHT 2.7 cm^2, trivial MR.  - Echo (2/23): EF 60-65%, mild LVH, normal RV systolic function but with D-shaped septum, normal bioprosthetic aortic valve, mild mitral stenosis (rheumatic-appearing valve, mean gradient 4 mmHg), mild MR.  7. TAH-BSO 8. Type I diabetes: Has insulin pump.  9. H/o ischemic colitis. 10. H/o PUD. 11. Diabetic gastroparesis. 12. Hyperlipidemia: Myalgias with Crestor and atorvastatin, elevated LFTs with simvastatin.  Myalgias with > 10 mg pravastatin. Now on Praluent.  13. CKD stage IV: Sees Dr. Lorrene Reid 14. Bicuspid aortic valve s/p 19 mm Edwards pericardial valve in 1/11 (had post-operative Dresslers syndrome).   15. CAD: CABG with SVG-RCA in 12/05.  Redo SVG-RCA in 1/11 with AVR.  LHC/RHC (9/14) with mean RA 18, PA 62/25 mean 43, mean PCWP 26, CI 2.8, 70-80% distal LM stenosis, 80% ostial LAD stenosis, total occlusion RCA, SVG-PDA patent.  Patient had DES LM into LAD.  Plan to continue long-term ASA/Brilinta (poor responder to Plavix).  ETT-cardiolite (4/15) with 6' exercise, EF 60%, ST depression in recovery and mild chest pain, no ischemia or infarction by perfusion images.  Unstable angina 11/15 with 80% LM, patent SVG-RCA => DES to ostial LM.   - Lexiscan Cardiolite (2/18): EF 62%, no ischemia or infarction (normal).  She had a significant reaction to [place] and probably should not get again.  - Cardiolite (9/18): EF 72%, prior inferior infarct with mild peri-infarct ischemia.  - LHC (9/18): 2 layers stent left main - ostial LAD with 50% dLM in-stent restenosis, 75+% ostial LCx, totally occluded RCA, SVG-RCA patent.  Medical management planned.  - ETT-Cardiolite (4/19): EF normal, 7'11" exercise, no ischemia or infarction.  - Lexiscan Cardiolite (8/20): No  ischemia/infarction.  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  17. Anemia: Iron-deficiency.  Suspect chronic GI blood loss.  EGD in 9/14 was unremarkable.  Capsule endoscopy 10/14 was unremarkable.  Has required periodic blood transfusions.  18. Atrial fibrillation: Paroxysmal, noted by [facility] interrogation.  19. Carotid stenosis: Carotid dopplers (2/15) with 40-59% bilateral stenosis. Carotid dopplers (3/16) with 40-59% bilateral ICA stenosis. Carotid dopplers (3/17) with 40-59% bilateral ICA stenosis.  - Carotid dopplers (3/18) with mild BICA stenosis.  20. 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  22. C difficile colitis 2017 23. Gout  24. ?TIA: head CT negative.  25. OSA: Moderate, uses CPAP.  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 Hypercalcemia 28. 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  Mild-moderate on 9/21 echo.  Mild on 2/23 echo.  29. Tricuspid regurgitation: Moderate by 8/20 echo.  30. Lung nodules: Followed at [facility].  31. ?Parkinsonism: She did not tolerate Sinemet.   Current Outpatient Medications  Medication Sig Dispense Refill   acyclovir ointment (ZOVIRAX) 5 % Apply 1 application topically every 3 (three) hours as needed (fever blister/cold sores).     ALPRAZolam (XANAX) 0.5 MG tablet Take 1 tablet (0.5 mg total) by mouth 2 (two) times daily as needed for anxiety. 60 tablet 1   APIDRA 100 UNIT/ML injection Inject 40 Units into the skin as directed. INFUSE THROUGH INSULIN PUMP UTD, Bolus depends on carb intake  2   aspirin EC 81 MG tablet Take 81 mg by mouth at bedtime.     BRILINTA 60 MG TABS tablet TAKE ONE TABLET (60MG TOTAL) BY MOUTH TWICE DAILY 180 tablet 3   cetirizine (ZYRTEC) 10 MG tablet Take 10 mg by mouth daily.     clobetasol (OLUX) 0.05 % topical foam Apply 1 application topically 2  (two) times daily as needed (skin flaking).     Coenzyme Q10 (CO Q 10) 100 MG CAPS Take 100 mg by mouth at bedtime.      colchicine 0.6 MG tablet Take 0.6 mg by mouth daily as needed (gout flare).     CONTOUR NEXT TEST test strip USE TO TEST BLOOD SUGAR UP TO EIGHT TIMES A DAY     denosumab (PROLIA) 60 MG/ML SOSY injection Inject 60 mg into the skin every 6 (six) months.     diclofenac Sodium (VOLTAREN) 1 % GEL as needed.     docusate sodium (COLACE) 100 MG capsule Take 100 mg by mouth as needed for mild constipation.     famotidine (PEPCID) 40 MG tablet Take 40 mg by mouth daily. Takes a second tablet at night if needed.     glucagon (GLUCAGON EMERGENCY) 1 MG injection Inject 1 mg into the vein once as needed (low blood sugar).     icosapent Ethyl (VASCEPA) 1 g capsule Take 2 capsules (2 g total) by mouth 2 (two) times daily. 360 capsule 3   isosorbide mononitrate (IMDUR) 120 MG 24 hr tablet TAKE ONE TABLET (120MG TOTAL) BY MOUTH DAILY 90 tablet 3   latanoprost (XALATAN) 0.005 % ophthalmic solution Place 1 drop into both eyes at bedtime.     metolazone (ZAROXOLYN) 2.5 MG tablet Take 2.5 mg (1 tab) once as directed by CHF clinic. Call before taking [phone] 3 tablet 0   mometasone (NASONEX) 50 MCG/ACT nasal spray Place into the nose. As needed     mupirocin cream (BACTROBAN) 2 % Apply 1 application topically 2 (two) times daily as needed. 90 g 0   nitroGLYCERIN (NITROLINGUAL) 0.4 MG/SPRAY spray PLACE 1 SPRAY UNDER THE TONGUE EVERY 5 MINUTES FOR 3 DOSES AS NEEDED FOR CHEST PAIN. 14.7 g 0   polyethylene glycol (MIRALAX / GLYCOLAX) 17 g packet Take 17 g by mouth as needed.     pravastatin (PRAVACHOL) 10 MG tablet TAKE ONE TABLET (10 MG TOTAL) BY MOUTH DAILY. 90 tablet 3   REPATHA SURECLICK 782 MG/ML SOAJ INJECT 140MG INTO THE SKIN EVERY 14 DAYS 6 mL 3   rOPINIRole (REQUIP) 0.25 MG tablet Take 0.25 mg by mouth at bedtime.     silver nitrate applicators  56-81 % applicator Use as needed to stop  superficial skin bleeding. 10 each 0   spironolactone (ALDACTONE) 25 MG tablet Take 0.5 tablets (12.5 mg total) by mouth daily. 45 tablet 3   torsemide (DEMADEX) 20 MG tablet Take 3 tablets (60 mg total) by mouth daily. 270 tablet 3   ULORIC 40 MG tablet Take 1 tablet (40 mg total) by mouth at bedtime. 90 tablet 3   UNABLE TO FIND Med Name: So Clean CPAP cleaning system 1 each 0   Wheat Dextrin (BENEFIBER) POWD Take 1 Dose by mouth daily.      metoprolol tartrate (LOPRESSOR) 25 MG tablet Take 1 tablet (25 mg total) by mouth 2 (two) times daily. 60 tablet 11   No current facility-administered medications for this encounter.    Allergies:   Avandia [rosiglitazone maleate], Cephalexin, Clindamycin, Clindamycin hcl, Humalog [insulin lispro], Lincomycin, Metformin, Novolog [insulin aspart], Penicillins, Rosiglitazone, Tizanidine, Adhesive [tape], Atorvastatin, Cholestyramine, Gentamycin [gentamicin], Iodinated contrast media, Limonene, Prednisone, Ranexa [ranolazine], Rosuvastatin, Simvastatin, Strawberry extract, Azelastine, Clopidogrel, Lisinopril, Oseltamivir phosphate, Tizanidine hcl, Codeine, Erythromycin, Hydrocodone-acetaminophen, Latex, Neomycin, Nickel, Ranitidine, Tamiflu [oseltamivir], and Tramadol   Social History:  The patient  reports that she has never smoked. She has never used smokeless tobacco. She reports that she does not drink alcohol and does not use drugs.   Family History:  The patient's family history includes Anal fissures in her mother; Breast cancer (age of onset: 22) in her sister; Cancer (age of onset: 11) in her sister; Cancer (age of onset: 38) in her maternal grandfather; Diabetes in her father; Glaucoma in her brother; Heart attack in her father, maternal grandmother, and mother; Heart disease in her father and mother; Hypertension in her brother; Lung cancer in her sister; Parkinson's disease in her paternal aunt.   ROS:  Please see the history of present illness.   All  other systems are personally reviewed and negative.   Exam:   BP 110/60    Pulse 64    Wt 54.2 kg (119 lb 6.4 oz)    SpO2 100%    BMI 21.84 kg/m  General: NAD Neck: JVP 10 cm, no thyromegaly or thyroid nodule.  Lungs: Clear to auscultation bilaterally with normal respiratory effort. CV: Nondisplaced PMI.  Heart regular S1/S2, no S3/S4, 1/6 SEM RUSB.  No peripheral edema.  No carotid bruit.  Normal pedal pulses.  Abdomen: Soft, nontender, no hepatosplenomegaly, no distention.  Skin: Intact without lesions or rashes.  Neurologic: Alert and oriented x 3.  Psych: Normal affect. Extremities: No clubbing or cyanosis.  HEENT: Normal.   Recent Labs: 05/09/2021: ALT 8 05/30/2021: B Natriuretic Peptide 140.9 08/31/2021: BUN 75; Creatinine, Ser 2.56; Hemoglobin 11.7; Platelets 125; Potassium 4.4; Sodium 137  Personally reviewed   Wt Readings from Last 3 Encounters:  08/31/21 54.2 kg (119 lb 6.4 oz)  06/22/21 53.7 kg (118 lb 4.8 oz)  05/30/21 54.3 kg (119 lb 12.8 oz)    ASSESSMENT AND PLAN:  1. CAD: s/p SVG-RCA in 12/05, then redo SVG-RCA in 1/11--> distal left main/proximal LAD severe stenosis in 9/14.  PCI with DES to distal LM/proximal LAD was done rather than bypass as she would have been a poor candidate for redo sternotomy (2 prior sternotomies and history of chest wall radiation for Hodgkins disease).  Recurrent unstable angina with 80% ostial LM treated with repeat DES in 11/15.  Lexiscan Cardiolite in 9/18 inferior changes.  Repeat LHC (9/18) showed 50% distal left main in-stent restenosis and 75+% ostial LCx  stenosis. I reviewed the films at the time with interventional cardiology => would be very difficult to intervene on the ostial LCx through 2 overlapping stents from left main into the LAD.  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 Given no ACS, we planned medical management and increased Imdur.  Cardiolite in 3/19 and again in 8/20 showed no ischemia or  infarction.  No exertional chest pain.  - Continue Brilinta 60 mg bid (poor responder to Plavix) long-term given recurrent left main disease.  - Continue ASA 81, statin/Praluent/Vascepa.  - Continue Imdur 120 mg daily . - Decrease metoprolol to 25 mg bid with 1st degree AVB.     - She was unable to tolerate ranolazine.  2. Bioprosthetic aortic valve replacement: In setting of bicuspid aortic valve, thoracic aorta not dilated on 2017 TEE.  Valve looked ok on 2/23 echo.   3. Chronic diastolic CHF: Restrictive cardiomyopathy.  Most recent echo in 2/23 shows normal LV systolic function, normal RV systolic function but D-shaped septum suggesting RV pressure/volume overload, mild MS, normal bioprosthetic AoV, no evidence for pericardial constriction. RHC in 8/20 was concerning for right > left heart failure, she did appear to have equalization of diastolic pressures concerning for restrictive cardiomyopathy, possibly related to prior radiation. CPX in 11/20 showed severe HF limitation.  Balance of renal failure and RV failure has been difficult.  Creatinine 2.65 most recently.  She is at least mildly volume overloaded on exam with stable weight and symptoms.  NYHA class II-III.   - Continue torsemide 60 mg daily.  Will get BMET today, if creatinine is higher will try to cut back a bit, but if creatinine is stable to lower, will not decrease.    - Continue spironolactone 12.5 mg daily.  - I had her evaluated at [facility] for heart/kidney transplant for CKD and restrictive cardiomyopathy, but extensive vascular calcification makes her a poor surgical candidate.  - No Jardiance/Farxiga given use of insulin pump.   4. CKD:  Patient has a single kidney s/p right nephrectomy and suspected diabetic nephropathy.  CKD stage 4, slowly progressive.  Creatinine 2.65 last check. I have been concerned for cardiorenal syndrome in the setting of RV > LV failure/restrictive cardiomyopathy.   - Not candidate for PD given prior  abdominal surgery.  - If CKD progresses, she may be a candidate for home HD.  - BMET today.  5. Hyperlipidemia: She has not been able to tolerate any higher potency statin than pravastatin 10 mg daily.   Good lipids in 8/22.  - Continue pravastatin.  - Continue Repatha.   - She is on Vascepa for elevated TGs.  6. Anemia: Chronic/slow GI bleed.  Unremarkable EGD and capsule endoscopy in fall 2014.  Unfortunately, needs to stay on Brilinta long-term (Plavix non-responder).   7. Atrial fibrillation: Paroxysmal, very brief episodes noted on PCM interrogation. Given GI bleeding and need to be on Brilinta, risks have been thought to outweigh benefits of anticoagulation. She is not a good Multaq candidate with CHF.  If she needs an antiarrhythmic, consider Tikosyn or Sotalol but renal function may make these meds difficult to use.  She has occasional palpitations but nothing prolonged.  NSR today.  - We were unable to place Watchman device due to nickel allergy.   8. Carotid bruit: Only mild stenosis on last dopplers in 3/18.  9. SSS: Pacemaker in place.  10. HTN: controlled. Continue current regimen.   11. OSA: Moderate, using Bipap.  12. ?TIA: Transient dysarthria in 3/18, could have been afib-related TIA.  No recurrent symptoms.  13. Thrombocytopenia: Chronic and mild. Follows with hematology.  14. 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  Mild on 2/23 echo.  She would not be a candidate for valve replacement.   Followup in 2 months.   Signed, [name], MD  08/31/2021  [facility] [address] [facility] [place] [address] [phone] (office) [phone] (fax)

## 2021-09-09 ENCOUNTER — Encounter (HOSPITAL_COMMUNITY): Payer: Self-pay | Admitting: Cardiology

## 2021-09-12 DIAGNOSIS — L308 Other specified dermatitis: Secondary | ICD-10-CM | POA: Diagnosis not present

## 2021-09-12 DIAGNOSIS — D225 Melanocytic nevi of trunk: Secondary | ICD-10-CM | POA: Diagnosis not present

## 2021-09-12 DIAGNOSIS — H01009 Unspecified blepharitis unspecified eye, unspecified eyelid: Secondary | ICD-10-CM | POA: Diagnosis not present

## 2021-09-12 DIAGNOSIS — Z1283 Encounter for screening for malignant neoplasm of skin: Secondary | ICD-10-CM | POA: Diagnosis not present

## 2021-09-13 DIAGNOSIS — K7581 Nonalcoholic steatohepatitis (NASH): Secondary | ICD-10-CM | POA: Diagnosis not present

## 2021-09-13 DIAGNOSIS — K8020 Calculus of gallbladder without cholecystitis without obstruction: Secondary | ICD-10-CM | POA: Diagnosis not present

## 2021-09-13 DIAGNOSIS — K7460 Unspecified cirrhosis of liver: Secondary | ICD-10-CM | POA: Diagnosis not present

## 2021-09-13 DIAGNOSIS — K7400 Hepatic fibrosis, unspecified: Secondary | ICD-10-CM | POA: Diagnosis not present

## 2021-09-14 ENCOUNTER — Ambulatory Visit (HOSPITAL_COMMUNITY)
Admission: RE | Admit: 2021-09-14 | Discharge: 2021-09-14 | Disposition: A | Discharge status: Home / Self Care | Payer: Medicare PPO | Source: Ambulatory Visit | Attending: Internal Medicine | Admitting: Internal Medicine

## 2021-09-14 ENCOUNTER — Other Ambulatory Visit (HOSPITAL_COMMUNITY): Payer: Self-pay | Admitting: Cardiology

## 2021-09-14 ENCOUNTER — Other Ambulatory Visit: Payer: Self-pay

## 2021-09-14 DIAGNOSIS — I4891 Unspecified atrial fibrillation: Secondary | ICD-10-CM

## 2021-09-14 DIAGNOSIS — Z853 Personal history of malignant neoplasm of breast: Secondary | ICD-10-CM | POA: Diagnosis not present

## 2021-09-14 DIAGNOSIS — Z85528 Personal history of other malignant neoplasm of kidney: Secondary | ICD-10-CM | POA: Diagnosis not present

## 2021-09-14 DIAGNOSIS — Z8572 Personal history of non-Hodgkin lymphomas: Secondary | ICD-10-CM | POA: Diagnosis not present

## 2021-09-14 DIAGNOSIS — R310 Gross hematuria: Secondary | ICD-10-CM | POA: Diagnosis not present

## 2021-09-14 NOTE — Progress Notes (Signed)
Zio patch placed onto patient.  All instructions and information reviewed with patient, they verbalize understanding with no questions. 

## 2021-09-15 DIAGNOSIS — J019 Acute sinusitis, unspecified: Secondary | ICD-10-CM | POA: Diagnosis not present

## 2021-09-27 DIAGNOSIS — R233 Spontaneous ecchymoses: Secondary | ICD-10-CM | POA: Diagnosis not present

## 2021-09-27 DIAGNOSIS — D696 Thrombocytopenia, unspecified: Secondary | ICD-10-CM | POA: Diagnosis not present

## 2021-09-27 DIAGNOSIS — N185 Chronic kidney disease, stage 5: Secondary | ICD-10-CM | POA: Diagnosis not present

## 2021-09-28 DIAGNOSIS — L958 Other vasculitis limited to the skin: Secondary | ICD-10-CM | POA: Diagnosis not present

## 2021-09-29 DIAGNOSIS — N952 Postmenopausal atrophic vaginitis: Secondary | ICD-10-CM | POA: Diagnosis not present

## 2021-09-29 DIAGNOSIS — N281 Cyst of kidney, acquired: Secondary | ICD-10-CM | POA: Diagnosis not present

## 2021-09-29 DIAGNOSIS — Z85528 Personal history of other malignant neoplasm of kidney: Secondary | ICD-10-CM | POA: Diagnosis not present

## 2021-10-03 ENCOUNTER — Encounter: Payer: Medicare PPO | Admitting: Internal Medicine

## 2021-10-03 ENCOUNTER — Ambulatory Visit (INDEPENDENT_AMBULATORY_CARE_PROVIDER_SITE_OTHER): Payer: Medicare PPO

## 2021-10-03 DIAGNOSIS — I495 Sick sinus syndrome: Secondary | ICD-10-CM | POA: Diagnosis not present

## 2021-10-03 LAB — CUP PACEART REMOTE DEVICE CHECK
Battery Impedance: 838 Ohm
Battery Remaining Longevity: 78 mo
Battery Voltage: 2.79 V
Brady Statistic AP VP Percent: 0 %
Brady Statistic AP VS Percent: 1 %
Brady Statistic AS VP Percent: 0 %
Brady Statistic AS VS Percent: 99 %
Date Time Interrogation Session: 20230314160759
Implantable Lead Implant Date: 20140130
Implantable Lead Implant Date: 20140130
Implantable Lead Location: 753859
Implantable Lead Location: 753860
Implantable Lead Model: 5076
Implantable Lead Model: 5076
Implantable Pulse Generator Implant Date: 20140130
Lead Channel Impedance Value: 481 Ohm
Lead Channel Impedance Value: 627 Ohm
Lead Channel Pacing Threshold Amplitude: 0.375 V
Lead Channel Pacing Threshold Amplitude: 0.875 V
Lead Channel Pacing Threshold Pulse Width: 0.4 ms
Lead Channel Pacing Threshold Pulse Width: 0.4 ms
Lead Channel Setting Pacing Amplitude: 1.75 V
Lead Channel Setting Pacing Amplitude: 2.5 V
Lead Channel Setting Pacing Pulse Width: 0.4 ms
Lead Channel Setting Sensing Sensitivity: 5.6 mV

## 2021-10-06 ENCOUNTER — Other Ambulatory Visit (HOSPITAL_COMMUNITY): Payer: Self-pay | Admitting: Cardiology

## 2021-10-06 DIAGNOSIS — I4891 Unspecified atrial fibrillation: Secondary | ICD-10-CM | POA: Diagnosis not present

## 2021-10-10 ENCOUNTER — Telehealth (HOSPITAL_COMMUNITY): Payer: Self-pay

## 2021-10-10 MED ORDER — METOPROLOL SUCCINATE ER 25 MG PO TB24: 25.0000 mg | ORAL_TABLET | Freq: Every day | ORAL | 11 refills | Status: DC

## 2021-10-10 NOTE — Telephone Encounter (Addendum)
Pt aware, agreeable, and verbalized understanding ? ? ?----- Message from Larey Dresser, MD sent at 10/08/2021 10:33 PM EDT ----- ?< 1% atrial fibrillation/flutter, longest 18 minutes.  However, episodes appear to be associated with palpitations. She can change her beta blocker to Toprol XL 25 mg daily.  ?

## 2021-10-11 DIAGNOSIS — M810 Age-related osteoporosis without current pathological fracture: Secondary | ICD-10-CM | POA: Diagnosis not present

## 2021-10-11 DIAGNOSIS — Z9641 Presence of insulin pump (external) (internal): Secondary | ICD-10-CM | POA: Diagnosis not present

## 2021-10-11 DIAGNOSIS — Z951 Presence of aortocoronary bypass graft: Secondary | ICD-10-CM | POA: Diagnosis not present

## 2021-10-11 DIAGNOSIS — I509 Heart failure, unspecified: Secondary | ICD-10-CM | POA: Diagnosis not present

## 2021-10-11 DIAGNOSIS — I2510 Atherosclerotic heart disease of native coronary artery without angina pectoris: Secondary | ICD-10-CM | POA: Diagnosis not present

## 2021-10-11 DIAGNOSIS — E1021 Type 1 diabetes mellitus with diabetic nephropathy: Secondary | ICD-10-CM | POA: Diagnosis not present

## 2021-10-11 DIAGNOSIS — E1022 Type 1 diabetes mellitus with diabetic chronic kidney disease: Secondary | ICD-10-CM | POA: Diagnosis not present

## 2021-10-11 DIAGNOSIS — N184 Chronic kidney disease, stage 4 (severe): Secondary | ICD-10-CM | POA: Diagnosis not present

## 2021-10-11 DIAGNOSIS — I130 Hypertensive heart and chronic kidney disease with heart failure and stage 1 through stage 4 chronic kidney disease, or unspecified chronic kidney disease: Secondary | ICD-10-CM | POA: Diagnosis not present

## 2021-10-12 ENCOUNTER — Telehealth (HOSPITAL_COMMUNITY): Payer: Self-pay

## 2021-10-12 ENCOUNTER — Encounter (HOSPITAL_COMMUNITY): Payer: Self-pay | Admitting: Cardiology

## 2021-10-12 NOTE — Telephone Encounter (Signed)
Pt. Called to query new medication. Called and left message for her to reassure her that the medication she got was the correct one. ?

## 2021-10-14 ENCOUNTER — Other Ambulatory Visit: Payer: Self-pay | Admitting: Cardiology

## 2021-10-17 NOTE — Progress Notes (Signed)
Remote pacemaker transmission.   

## 2021-10-18 DIAGNOSIS — I480 Paroxysmal atrial fibrillation: Secondary | ICD-10-CM | POA: Diagnosis not present

## 2021-10-18 DIAGNOSIS — Z0000 Encounter for general adult medical examination without abnormal findings: Secondary | ICD-10-CM | POA: Diagnosis not present

## 2021-10-18 DIAGNOSIS — Z23 Encounter for immunization: Secondary | ICD-10-CM | POA: Diagnosis not present

## 2021-10-18 DIAGNOSIS — F419 Anxiety disorder, unspecified: Secondary | ICD-10-CM | POA: Diagnosis not present

## 2021-10-18 DIAGNOSIS — N952 Postmenopausal atrophic vaginitis: Secondary | ICD-10-CM | POA: Diagnosis not present

## 2021-10-18 DIAGNOSIS — E7800 Pure hypercholesterolemia, unspecified: Secondary | ICD-10-CM | POA: Diagnosis not present

## 2021-10-18 DIAGNOSIS — E1059 Type 1 diabetes mellitus with other circulatory complications: Secondary | ICD-10-CM | POA: Diagnosis not present

## 2021-10-24 ENCOUNTER — Encounter: Payer: Self-pay | Admitting: Internal Medicine

## 2021-10-24 ENCOUNTER — Ambulatory Visit (INDEPENDENT_AMBULATORY_CARE_PROVIDER_SITE_OTHER): Payer: Medicare PPO | Admitting: Internal Medicine

## 2021-10-24 VITALS — BP 110/48 | HR 93 | Ht 63.0 in | Wt 117.5 lb

## 2021-10-24 DIAGNOSIS — I480 Paroxysmal atrial fibrillation: Secondary | ICD-10-CM | POA: Diagnosis not present

## 2021-10-24 NOTE — Progress Notes (Signed)
? ? ? ? ?HPI ?[name] returns today for PPM followup. She is a pleasant 71 yo woman with a h/o CHB, s/p PPM, AS, s/p AVR, PAF, and easy bleeding. She gets tired easily and has had trouble with her kidney function. Her diuretic dosing has been adjusted up and down. She has rare palpitations. No syncope. ?Allergies  ?Allergen Reactions  ? Avandia [Rosiglitazone Maleate] Other (See Comments)  ?  Congestive heart failure  ? Cephalexin Anaphylaxis, Swelling and Rash  ?  Extremities swell , throat swelling ?  ? Clindamycin Anaphylaxis and Shortness Of Breath  ?  Other reaction(s): esophageal spasms  ? Clindamycin Hcl Anaphylaxis and Shortness Of Breath  ? Humalog [Insulin Lispro] Itching  ?   big knots and whelp ?  ? Lincomycin Anaphylaxis and Shortness Of Breath  ? Metformin Other (See Comments)  ?  Congestive heart failure  ? Novolog [Insulin Aspart] Hives and Itching  ?  Humalog & Novolog big knots and whelp ? ?  ? Penicillins Anaphylaxis, Hives, Swelling and Rash  ?  Did it involve swelling of the face/tongue/throat, SOB, or low BP? Yes ?Did it involve sudden or severe rash/hives, skin peeling, or any reaction on the inside of your mouth or nose? No ?Did you need to seek medical attention at a hospital or doctor's office? Yes ?When did it last happen?      Childhood allergy  ?If all above answers are ?NO?, may proceed with cephalosporin use. ?  ? Rosiglitazone Other (See Comments)  ?  Congestive heart failure  ? Tizanidine Other (See Comments)  ?  Dizziness, Mental Status Changes, Hallucination ? ?  ? Adhesive [Tape] Other (See Comments)  ?  Tears skin, Please use "paper" tape  ? Atorvastatin Other (See Comments)  ?  increased LFT's, no muscle weakness, leg pain ?Muscle and joint pain increased LFT's ? ?Increased LFTs  ? Cholestyramine Other (See Comments)  ?  Liver Disorder Elevated LFTs ? ?  ? Gentamycin [Gentamicin] Hives, Itching and Rash  ?  Fine red bumps.  Reaction noted post loop recorder implant, where  gentamycin was used for irrigation. Topical prep  ? Iodinated Contrast Media Other (See Comments)  ?  PAIN DURING LYMPHANGIOGRAM '81, NO ALLERGY TO IV CONTRAST.  Has had procedures with Iodine since without problems. ?  ? Limonene Hives, Itching, Rash and Other (See Comments)  ?  Reaction noted post loop recorder implant, where gentamycin was used for irrigation. Topical prep  ? Prednisone Itching and Other (See Comments)  ?  B/P went high, itching all over.  Tolerates with benadryl   ? Ranexa [Ranolazine] Other (See Comments)  ?  Nausea, dizziness, low blood sugar, tingling in right hand, "blood blisters" on tongue   ? Rosuvastatin Nausea And Vomiting, Nausea Only and Other (See Comments)  ?  Muscle and joint pain & increased LFTs; tolerates Pravastatin 11m (max) ?Other reaction(s): elevated LFTs  ? Simvastatin Other (See Comments)  ?  Increased LFT's ?  ? Strawberry Extract Hives, Itching, Rash and Other (See Comments)  ?  Reaction noted post loop recorder implant, where gentamycin was used for irrigation. Topical prep.  Can eat fresh strawberries   ? Azelastine   ?  Other reaction(s): metallic taste,spitting up blood  ? Clopidogrel Other (See Comments)  ?  Does not work for patient ?Medicine was not effective ?Other reaction(s): Unknown  ? Lisinopril   ?  Other reaction(s): nausea and loose stools  ? Oseltamivir Phosphate   ?  Other reaction(s): Nausea and vomiting  ? Tizanidine Hcl   ?  Other reaction(s): dizzy, hallucinations  ? Codeine Nausea And Vomiting  ? Erythromycin Nausea And Vomiting  ? Hydrocodone-Acetaminophen Itching, Rash and Other (See Comments)  ?  itching  ? Latex Itching and Other (See Comments)  ?  I"if wear gloves hands get red ,itchy no prioblem if other wear and touch me"  ? Neomycin Rash and Other (See Comments)  ?  Redness  ? Nickel Itching  ? Ranitidine Nausea Only  ? Tamiflu [Oseltamivir] Nausea And Vomiting  ? Tramadol Nausea Only  ? ? ? ?Current Outpatient Medications  ?Medication Sig  Dispense Refill  ? acyclovir ointment (ZOVIRAX) 5 % Apply 1 application topically every 3 (three) hours as needed (fever blister/cold sores).    ? ALPRAZolam (XANAX) 0.5 MG tablet Take 1 tablet (0.5 mg total) by mouth 2 (two) times daily as needed for anxiety. 60 tablet 1  ? APIDRA 100 UNIT/ML injection Inject 40 Units into the skin as directed. INFUSE THROUGH INSULIN PUMP UTD, [place] depends on carb intake  2  ? aspirin EC 81 MG tablet Take 81 mg by mouth at bedtime.    ? BRILINTA 60 MG TABS tablet TAKE ONE TABLET (60MG TOTAL) BY MOUTH TWICE DAILY 180 tablet 3  ? cetirizine (ZYRTEC) 10 MG tablet Take 10 mg by mouth daily.    ? clobetasol (OLUX) 0.05 % topical foam Apply 1 application topically 2 (two) times daily as needed (skin flaking).    ? Coenzyme Q10 (CO Q 10) 100 MG CAPS Take 100 mg by mouth at bedtime.     ? colchicine 0.6 MG tablet Take 0.6 mg by mouth daily as needed (gout flare).    ? CONTOUR NEXT TEST test strip USE TO TEST BLOOD SUGAR UP TO EIGHT TIMES A DAY    ? denosumab (PROLIA) 60 MG/ML SOSY injection Inject 60 mg into the skin every 6 (six) months.    ? diclofenac Sodium (VOLTAREN) 1 % GEL as needed.    ? docusate sodium (COLACE) 100 MG capsule Take 100 mg by mouth as needed for mild constipation.    ? Estradiol 10 MCG TABS vaginal tablet Place vaginally.    ? famotidine (PEPCID) 40 MG tablet Take 40 mg by mouth daily. Takes a second tablet at night if needed.    ? glucagon (GLUCAGON EMERGENCY) 1 MG injection Inject 1 mg into the vein once as needed (low blood sugar).    ? icosapent Ethyl (VASCEPA) 1 g capsule Take 2 capsules (2 g total) by mouth 2 (two) times daily. 360 capsule 3  ? isosorbide mononitrate (IMDUR) 120 MG 24 hr tablet TAKE ONE TABLET (120MG TOTAL) BY MOUTH DAILY 90 tablet 3  ? latanoprost (XALATAN) 0.005 % ophthalmic solution Place 1 drop into both eyes at bedtime.    ? metolazone (ZAROXOLYN) 2.5 MG tablet Take 2.5 mg (1 tab) once as directed by CHF clinic. Call before taking  [phone] 3 tablet 0  ? metoprolol succinate (TOPROL XL) 25 MG 24 hr tablet Take 1 tablet (25 mg total) by mouth daily. 30 tablet 11  ? mometasone (NASONEX) 50 MCG/ACT nasal spray Place into the nose. As needed    ? mupirocin cream (BACTROBAN) 2 % Apply 1 application topically 2 (two) times daily as needed. 90 g 0  ? nitroGLYCERIN (NITROLINGUAL) 0.4 MG/SPRAY spray PLACE 1 SPRAY UNDER THE TONGUE EVERY 5 MINUTES FOR 3 DOSES AS NEEDED FOR CHEST PAIN. 14.7 g  0  ? polyethylene glycol (MIRALAX / GLYCOLAX) 17 g packet Take 17 g by mouth as needed.    ? pravastatin (PRAVACHOL) 10 MG tablet TAKE ONE TABLET (10 MG TOTAL) BY MOUTH DAILY. 90 tablet 3  ? REPATHA SURECLICK 979 MG/ML SOAJ INJECT 140MG INTO THE SKIN EVERY 14 DAYS 6 mL 3  ? rOPINIRole (REQUIP) 0.25 MG tablet TAKE ONE TABLET (0.25MG TOTAL) BY MOUTH DAILY. TAKE TWO HOURS PRIOR TO BEDTIME EVERY NIGHT. 90 tablet 1  ? silver nitrate applicators 89-21 % applicator Use as needed to stop superficial skin bleeding. 10 each 0  ? spironolactone (ALDACTONE) 25 MG tablet Take 0.5 tablets (12.5 mg total) by mouth daily. 45 tablet 3  ? torsemide (DEMADEX) 20 MG tablet Take 3 tablets (60 mg total) by mouth daily. 270 tablet 3  ? ULORIC 40 MG tablet Take 1 tablet (40 mg total) by mouth at bedtime. 90 tablet 3  ? UNABLE TO FIND Med Name: So Clean CPAP cleaning system 1 each 0  ? Wheat Dextrin (BENEFIBER) POWD Take 1 Dose by mouth daily.     ? ?No current facility-administered medications for this visit.  ? ? ? ?Past Medical History:  ?Diagnosis Date  ? Anxiety   ? Aortic stenosis   ? a. bicuspid aortic valve; mean gradient of 20 mmHg in 2/10; b. s/p bioprosth AVR (19-mm Edwards pericardial valve) with a redo coronary artery bypass graft procedure in 07/2009; postoperative Dressler's syndrome    ? Arthritis   ? Atrial fibrillation ([place])   ? Breast cancer ([place])   ? Carcinoma, renal cell ([place]) 05/2007  ? Laparoscopic right nephrectomy  ? 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  Chronic diastolic CHF (congestive heart failure) ([place])   ? a. 9.2014 EchoP EF 60-65%, no rwma, bioprosth AVR, mean gradient of 49mHg, mild to mod MR, PASP 512mg.  ? CKD (chronic kidney disease), stage II

## 2021-10-24 NOTE — Patient Instructions (Signed)
Medication Instructions:  Your physician recommends that you continue on your current medications as directed. Please refer to the Current Medication list given to you today.  *If you need a refill on your cardiac medications before your next appointment, please call your pharmacy*   Lab Work: NONE   If you have labs (blood work) drawn today and your tests are completely normal, you will receive your results only by: . MyChart Message (if you have MyChart) OR . A paper copy in the mail If you have any lab test that is abnormal or we need to change your treatment, we will call you to review the results.   Testing/Procedures: NONE    Follow-Up: At [facility], you and your health needs are our priority.  As part of our continuing mission to provide you with exceptional heart care, we have created designated Provider Care Teams.  These Care Teams include your primary Cardiologist (physician) and Advanced Practice Providers (APPs -  Physician Assistants and Nurse Practitioners) who all work together to provide you with the care you need, when you need it.  We recommend signing up for the patient portal called "MyChart".  Sign up information is provided on this After Visit Summary.  MyChart is used to connect with patients for Virtual Visits (Telemedicine).  Patients are able to view lab/test results, encounter notes, upcoming appointments, etc.  Non-urgent messages can be sent to your provider as well.   To learn more about what you can do with MyChart, go to [email or web address].    Your next appointment:   1 year(s)  The format for your next appointment:   In Person  Provider:   Gregg Taylor, MD   Other Instructions Thank you for choosing [facility]!    

## 2021-10-26 DIAGNOSIS — M79644 Pain in right finger(s): Secondary | ICD-10-CM | POA: Diagnosis not present

## 2021-11-01 ENCOUNTER — Telehealth (HOSPITAL_COMMUNITY): Payer: Self-pay

## 2021-11-01 DIAGNOSIS — E1021 Type 1 diabetes mellitus with diabetic nephropathy: Secondary | ICD-10-CM | POA: Diagnosis not present

## 2021-11-01 NOTE — Telephone Encounter (Signed)
Patient called to report that she is having a emergency root canal done tomorrow and wanted to know if she needed to hold her Brilinta. They did start her on a abx yesterday. Please advise.  ? ?CB#[phone] ?

## 2021-11-16 ENCOUNTER — Encounter (HOSPITAL_COMMUNITY): Payer: Self-pay | Admitting: Cardiology

## 2021-11-16 ENCOUNTER — Ambulatory Visit (HOSPITAL_COMMUNITY)
Admission: RE | Admit: 2021-11-16 | Discharge: 2021-11-16 | Disposition: A | Discharge status: Home / Self Care | Payer: Medicare PPO | Source: Ambulatory Visit | Attending: Cardiology | Admitting: Cardiology

## 2021-11-16 VITALS — BP 124/60 | HR 76 | Wt 115.8 lb

## 2021-11-16 DIAGNOSIS — R0989 Other specified symptoms and signs involving the circulatory and respiratory systems: Secondary | ICD-10-CM | POA: Diagnosis not present

## 2021-11-16 DIAGNOSIS — D696 Thrombocytopenia, unspecified: Secondary | ICD-10-CM | POA: Insufficient documentation

## 2021-11-16 DIAGNOSIS — Z8571 Personal history of Hodgkin lymphoma: Secondary | ICD-10-CM | POA: Insufficient documentation

## 2021-11-16 DIAGNOSIS — Z905 Acquired absence of kidney: Secondary | ICD-10-CM | POA: Insufficient documentation

## 2021-11-16 DIAGNOSIS — Z7902 Long term (current) use of antithrombotics/antiplatelets: Secondary | ICD-10-CM | POA: Insufficient documentation

## 2021-11-16 DIAGNOSIS — E1022 Type 1 diabetes mellitus with diabetic chronic kidney disease: Secondary | ICD-10-CM | POA: Diagnosis not present

## 2021-11-16 DIAGNOSIS — N184 Chronic kidney disease, stage 4 (severe): Secondary | ICD-10-CM | POA: Insufficient documentation

## 2021-11-16 DIAGNOSIS — I050 Rheumatic mitral stenosis: Secondary | ICD-10-CM | POA: Insufficient documentation

## 2021-11-16 DIAGNOSIS — Z79899 Other long term (current) drug therapy: Secondary | ICD-10-CM | POA: Diagnosis not present

## 2021-11-16 DIAGNOSIS — I425 Other restrictive cardiomyopathy: Secondary | ICD-10-CM | POA: Insufficient documentation

## 2021-11-16 DIAGNOSIS — Z951 Presence of aortocoronary bypass graft: Secondary | ICD-10-CM | POA: Diagnosis not present

## 2021-11-16 DIAGNOSIS — Z955 Presence of coronary angioplasty implant and graft: Secondary | ICD-10-CM | POA: Insufficient documentation

## 2021-11-16 DIAGNOSIS — G4733 Obstructive sleep apnea (adult) (pediatric): Secondary | ICD-10-CM | POA: Diagnosis not present

## 2021-11-16 DIAGNOSIS — I5032 Chronic diastolic (congestive) heart failure: Secondary | ICD-10-CM

## 2021-11-16 DIAGNOSIS — Z91048 Other nonmedicinal substance allergy status: Secondary | ICD-10-CM | POA: Insufficient documentation

## 2021-11-16 DIAGNOSIS — Z950 Presence of cardiac pacemaker: Secondary | ICD-10-CM | POA: Insufficient documentation

## 2021-11-16 DIAGNOSIS — I495 Sick sinus syndrome: Secondary | ICD-10-CM | POA: Insufficient documentation

## 2021-11-16 DIAGNOSIS — I130 Hypertensive heart and chronic kidney disease with heart failure and stage 1 through stage 4 chronic kidney disease, or unspecified chronic kidney disease: Secondary | ICD-10-CM | POA: Insufficient documentation

## 2021-11-16 DIAGNOSIS — Z7982 Long term (current) use of aspirin: Secondary | ICD-10-CM | POA: Diagnosis not present

## 2021-11-16 DIAGNOSIS — D509 Iron deficiency anemia, unspecified: Secondary | ICD-10-CM | POA: Diagnosis not present

## 2021-11-16 DIAGNOSIS — I480 Paroxysmal atrial fibrillation: Secondary | ICD-10-CM | POA: Diagnosis not present

## 2021-11-16 DIAGNOSIS — Z953 Presence of xenogenic heart valve: Secondary | ICD-10-CM | POA: Insufficient documentation

## 2021-11-16 DIAGNOSIS — Z8774 Personal history of (corrected) congenital malformations of heart and circulatory system: Secondary | ICD-10-CM | POA: Insufficient documentation

## 2021-11-16 DIAGNOSIS — I4891 Unspecified atrial fibrillation: Secondary | ICD-10-CM

## 2021-11-16 DIAGNOSIS — E785 Hyperlipidemia, unspecified: Secondary | ICD-10-CM | POA: Insufficient documentation

## 2021-11-16 DIAGNOSIS — Z9641 Presence of insulin pump (external) (internal): Secondary | ICD-10-CM | POA: Insufficient documentation

## 2021-11-16 DIAGNOSIS — Z9013 Acquired absence of bilateral breasts and nipples: Secondary | ICD-10-CM | POA: Insufficient documentation

## 2021-11-16 DIAGNOSIS — I25700 Atherosclerosis of coronary artery bypass graft(s), unspecified, with unstable angina pectoris: Secondary | ICD-10-CM | POA: Insufficient documentation

## 2021-11-16 DIAGNOSIS — Z923 Personal history of irradiation: Secondary | ICD-10-CM | POA: Insufficient documentation

## 2021-11-16 LAB — BASIC METABOLIC PANEL
Anion gap: 8 (ref 5–15)
BUN: 80 mg/dL — ABNORMAL HIGH (ref 8–23)
CO2: 28 mmol/L (ref 22–32)
Calcium: 9.7 mg/dL (ref 8.9–10.3)
Chloride: 102 mmol/L (ref 98–111)
Creatinine, Ser: 2.16 mg/dL — ABNORMAL HIGH (ref 0.44–1.00)
GFR, Estimated: 24 mL/min — ABNORMAL LOW (ref >60)
Glucose, Bld: 136 mg/dL — ABNORMAL HIGH (ref 70–99)
Potassium: 4.1 mmol/L (ref 3.5–5.1)
Sodium: 138 mmol/L (ref 135–145)

## 2021-11-16 LAB — BRAIN NATRIURETIC PEPTIDE: B Natriuretic Peptide: 93.7 pg/mL (ref 0.0–100.0)

## 2021-11-16 MED ORDER — TORSEMIDE 20 MG PO TABS: ORAL_TABLET | ORAL | 3 refills | Status: DC

## 2021-11-16 NOTE — Progress Notes (Signed)
?  ? ?Date:  11/16/2021  ? ?ID:  Briley, Bumgarner 1951/03/07, MRN [id number]   ?Provider location: [address] [place] ?Type of Visit: Established patient ? ?PCP:  [name], MD  ?Cardiologist:  None ?Primary HF: Dr. Aundra Dubin ?  ?History of Present Illness: ?[name] [name] is a 71 y.o. female who has a history of CAD s/p CABG in 12/05 and redo in 1/11, bioprosthetic AVR, diastolic CHF, and sick sinus syndrome s/p PCM presents for cardiology followup.  She had initial CABG with SVG-RCA in 12/05 followed by redo SVG-RCA and bioprosthetic AVR in 1/11.  She has had diastolic CHF and is on Lasix.  In 9/14, she had a CPX test.  This showed severe functional limitation with ischemic ECG changes (inferolateral ST depression and ST elevation in V1 and V2). She had chest pain.  She had R/LHC in 9/14.  This showed elevated left and right heart filling pressures, patent SVG-PDA, and 80% distal LM/80% ostial LAD stenosis.  She was not a candidate for redo CABG (had 2 prior sternotomies as well as chest radiation for Hodgkins lymphoma).  She had DES from left main into proximal LAD and will need long-term DAPT => Brilinta was used as she has been a poor responder to Plavix in the past.  She re-developed angina in 11/15 and was admitted with unstable angina.  LHC showed 80% ostial LM and patent SVG-RCA. She had DES to ostial LM. Echo in 1/18 showed EF 60-65%, normal bioprosthetic aortic valve, mild-moderate MR, PASP 35 mmHg.  ?  ?She has chronic iron deficiency anemia thought to be due to a slow GI bleed.  Last EGD was unremarkable and a capsule endoscopy was also unremarkable.  She has had periodic transfusions.   ?  ?She had bilateral mastectomy for DCIS in 39/03 without complication. ?  ?She has an insulin pump followed by an endocrinologist at [facility].  ?  ?Given myalgias with statins, she was started on Praluent.  LDL is now excellent.   ?  ?In the past she has had short runs of atrial fibrillation.   She is not anticoagulated. Planned for Watchman placement, but given her nickel allergy, we were unable to place a Watchman.    ?  ?Diagnosed with OSA, now on CPAP.  ?  ?Given ongoing problems with exertional dyspnea and occasional chest pain, Cardiolite was repeated in 9/18 and showed inferior changes.  She underwent left/right heart cath in 9/18.  This showed elevated filling pressures, R>L.  There was 75+% ostial LCx stenosis.  However, there were 2 layers of stent from the mid left main into the ostial LAD, and LCx intervention was thought to be very risky.  It was decided to manage her medically.  Echo was repeated in 10/18 showing EF 55-60%, moderate to severe TR.   ?  ?Given recurrent chest pain, she had ETT-Cardiolite done in 3/19.  This was a normal study with no evidence for ischemia or infarction.  Echo in 1/20 showed EF 55-60%, mild LVH, stable bioprosthetic aortic valve, moderate-severe TR.   ? ?Due to increased dyspnea and worsening renal function, she was set up for echo and RHC.  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  [place] in 8/20 showed primarily right-sided HF.  Cardiolite in 8/20  showed no ischemia or infarction.  Torsemide was increased and she cut back on sodium in her diet.   ? ?CPX in 11/20 showed severe functional limitation from HF.  ? ?She saw Dr. Mosetta Pigeon at [facility] for evaluation for heart/kidney transplant (for restrictive cardiomyopathy).  She was determined to be a poor surgical candidate due to extensive vascular calcifications.  ? ?CT chest in 8/21 showed a loculated right pleural effusion but unable to find enough fluid by ultrasound for thoracentesis.  ? ?Echo in 9/21 showed EF 60-65% with mild LVH, mild RV dilation with mildly decreased systolic  function and a D-shaped septum, PASP 40, normal bioprosthetic aortic valve, rheumatic-appearing MV with mild-moderate mitral stenosis (mean gradient 9 but MVA by PHT 2.7 cm^2), trivial MR.  Echo was done today and reviewed, EF 60-65%, mild LVH, normal RV systolic function but with D-shaped septum, normal bioprosthetic aortic valve, mild mitral stenosis (rheumatic-appearing valve, mean gradient 4 mmHg), mild MR.  ?  ?She returns today for followup of CHF and CAD.  She has very rare chest pain.  Dyspnea is stable.  She is short of breath with stairs, generally does ok on flat ground as long as she walks slowly.  Still has occasional episodes of palpitations that may be attributable to atrial fibrillation (do not last long).  Zio monitor from 3/23 showed < 1% atrial fibrillation, longest run was 18 minutes.  ?  ?Labs (2/19): hgb 13.2 ?Labs @ [facility] (3/19): K 4.7, creatinine 1.4, ANA negative, RF negative ?Labs (4/19): K 3.9, creatinine 1.52 => 1.63 ?Labs (6/19): LDL 8, HDL 42, TGs 259 ?Labs (9/19): K 4.9, creatinine 1.65, hgb 13.4, plts 129, LDL 46,TGs 182 ?Labs (12/19): K 4.9, creatinine 2.07, hgb 12.4 ?Labs (3/20): K 4, creatinine 1.8 ?Labs (6/20): K 4.7, creatinine 1.47 ?Labs (8/20): creatinine 2.13 => 2.46 => 2.4, LDL 22 ?Labs (9/20): K 4.8, creatinine 2.74, AST/ALT normal, hgb 12.6 ?Labs (1/21): K 4.1, creatinine 1.86, hgb 11.7 ?Labs (4/21): K 4.7, creatinine 1.7 ?Labs (6/21): K 4.2, creatinine 2 => 1.7 ?Labs (8/21): K 3.8, creatinine 1.69, hgb 10.6, plts 116 ?Labs (9/21): LDL 34, TGs 209 ?Labs (1/22): K 3.8, creatinine 2.0 ?Labs (3/22): LDL 25 ?Labs (4/22): K 4.7, creatinine 2.0, hgb 12.3 ?Labs (5/22): K 4.4, creatinine 2.21 ?Labs (8/22): LDL 1, TGs 330 ?Labs (10/22): K 4.6, creatinine 1.87 ?Labs (11/22): LDL 26 ?Labs (12/22): K 4.1, creatinine 2.25 ?Labs (1/23): creatinine 2.65 ?Labs (3/23): K 4.6, creatinine 2.8 ?  ?PMH: ?1. Sick sinus syndrome s/p Medtronic PCM.  ?2. HTN ?3. H/o SBO ?4. Renal cell carcinoma s/p  right nephrectomy in 2008.  ?5. 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  Fibroscan in 11/14 showed advanced fibrosis concerning for cirrhosis. EGD in 11/14 showed no varices.  ?6. Diastolic CHF: Echo (3/53) with EF 60-65%, bioprosthetic aortic valve with mean gradient 15 mmHg, mild MR.  CPX (9/14): peak VO2 10.4, VE/VCO2 43.8, inferolateral ST depression and ST elevation in V1/V2 with severe dyspnea and chest pain, severe functional limitation with ischemic changes.  Echo (9/14) with EF 60-65%, normal RV size and systolic function, mild-moderate MR, bioprosthetic aortic valve normal, PA systolic pressure 51 mmHg, no evidence for pericardial constriction though exam incomplete for constriction.  Echo (11/15) with EF 55-60%, bioprosthetic aortic valve, mild-moderate MR, moderate TR, PA systolic pressure 42 mmHg.  ?- TEE (5/17) with EF 60-65%, normal bioprosthetic aortic valve with mean gradient 13 mmHg, normal RV size and systolic function. ?- Echo (1/18) with EF 60-65%, normal bioprosthetic  aortic valve, mild-moderate MR, PASP 35 mmHg.  ?- Echo (10/18) with EF 55-60%, bioprosthetic aortic valve mean gradient 10 mmHg, MAC with mild mitral stenosis (mean gradient 7) and mild mitral regurgitation, moderate to severe TR, PASP 43 mmHg.  ?- RHC (9/18): mean RA 13, PA 44/18 mean 33, mean PCWP 19, CI 3.45, PVR 2.7 WU.  ?- Echo (1/20): EF 55-60%, mild LVH, mild MR, stable bioprosthetic aortic valve, moderate-severe TR.  ?- RHC (8/20): mean RA 14, PA 43/15 mean 28, mean PCWP 13, PAPI 2, PVR 4.2, CI 2.31 ?- Echo (8/20): EF 55-60%, RV mildly dilated with normal systolic function, mildly D-shaped septum suggestive of RV pressure/volume overload, rheumatic-appearing mitral valve with mild mitral stenosis (mean gradient 6, MVA by PHT 4 cm^2), no evidence for pericardial constriction, moderate TR, bioprosthetic aortic valve functioning normally with mean gradient 6 mmHg.  ?- CPX (11/20): peak VO2 9.8, VE/VCO2 slope 38, RER 1.11 =>  severe functional limitation from HF.   ?- Echo (9/21): EF 60-65% with mild LVH, mild RV dilation with mildly decreased systolic function and a D-shaped septum, PASP 40, normal bioprosthetic aortic valve, rheuma

## 2021-11-16 NOTE — Addendum Note (Signed)
Encounter addended by: Larey Dresser, MD on: 11/16/2021 11:18 PM ? Actions taken: Level of Service modified, Visit diagnoses modified

## 2021-11-16 NOTE — Patient Instructions (Signed)
Medication Changes: ? ?Change Torsemide to 60 mg daily, Alternating with 40 mg daily ? ?Lab Work: ? ?Labs done today, your results will be available in MyChart, we will contact you for abnormal readings. ? ? ?Testing/Procedures: ? ?none ? ?Referrals: ? ?none ? ?Special Instructions // Education: ? ?none ? ?Follow-Up in: 3 months  ? ?At the [facility], you and your health needs are our priority. We have a designated team specialized in the treatment of Heart Failure. This Care Team includes your primary Heart Failure Specialized Cardiologist (physician), Advanced Practice Providers (APPs- Physician Assistants and Nurse Practitioners), and Pharmacist who all work together to provide you with the care you need, when you need it.  ? ?You may see any of the following providers on your designated Care Team at your next follow up: ? ?Dr Glori Bickers ?Dr [name] ?Darrick Grinder, NP ?Lyda Jester, PA ?Jessica Milford,NP ?Marlyce Huge, PA ?Audry Riles, PharmD ? ? ?Please be sure to bring in all your medications bottles to every appointment.  ? ?Need to Contact us: ? ?If you have any questions or concerns before your next appointment please send [name] a message through Echo or call our office at [phone].   ? ?TO LEAVE A MESSAGE FOR THE NURSE SELECT OPTION 2, PLEASE LEAVE A MESSAGE INCLUDING: ?YOUR NAME ?DATE OF BIRTH ?CALL BACK NUMBER ?REASON FOR CALL**this is important as we prioritize the call backs ? ?YOU WILL RECEIVE A CALL BACK THE SAME DAY AS LONG AS YOU CALL BEFORE 4:00 PM ? ? ?

## 2021-11-22 DIAGNOSIS — E10649 Type 1 diabetes mellitus with hypoglycemia without coma: Secondary | ICD-10-CM | POA: Diagnosis not present

## 2021-11-28 ENCOUNTER — Encounter: Payer: Self-pay | Admitting: *Deleted

## 2021-11-28 ENCOUNTER — Telehealth: Payer: Self-pay | Admitting: *Deleted

## 2021-11-28 DIAGNOSIS — G4733 Obstructive sleep apnea (adult) (pediatric): Secondary | ICD-10-CM

## 2021-11-28 NOTE — Telephone Encounter (Signed)
-----   Message from Sueanne Margarita, MD sent at 11/28/2021  8:43 AM EDT ----- ?Add chin strap and get download in 4 weeks ?----- Message ----- ?From: Freada Bergeron, CMA ?Sent: 11/27/2021   3:50 PM EDT ?To: Sueanne Margarita, MD ? ?Reminder made to get d/l in 4 weeks ? ?

## 2021-11-28 NOTE — Telephone Encounter (Signed)
This encounter was created in error - please disregard.

## 2021-11-28 NOTE — Telephone Encounter (Signed)
Order placed to [employer] via community message 

## 2021-12-05 DIAGNOSIS — S3992XA Unspecified injury of lower back, initial encounter: Secondary | ICD-10-CM | POA: Diagnosis not present

## 2021-12-11 ENCOUNTER — Ambulatory Visit: Payer: Medicare PPO | Admitting: Physician Assistant

## 2021-12-11 ENCOUNTER — Encounter: Payer: Self-pay | Admitting: Physician Assistant

## 2021-12-11 DIAGNOSIS — F418 Other specified anxiety disorders: Secondary | ICD-10-CM | POA: Diagnosis not present

## 2021-12-11 DIAGNOSIS — M5440 Lumbago with sciatica, unspecified side: Secondary | ICD-10-CM | POA: Diagnosis not present

## 2021-12-11 DIAGNOSIS — M25551 Pain in right hip: Secondary | ICD-10-CM | POA: Insufficient documentation

## 2021-12-11 NOTE — Progress Notes (Signed)
Office Visit Note   Patient: [name] [name]           Date of Birth: [birth year]           MRN: [id number] Visit Date: 12/11/2021              Requested by: [name], [address] [place],  [place] [address] PCP: [name], MD  Chief Complaint  Patient presents with   Right Hip - Follow-up      HPI: Patient is a pleasant 71 year old woman with a 3-week history of right posterior hip pain.  She was on a camping trip and fell backwards down a driveway when picking up her dog.  She fell hard onto the right side.  She denies any previous history of back pain or hip pain.  She denies any loss of bowel or bladder control or any numbness.  She focuses the pain in the lower part of her back over the sacrum with slight radiation over to the posterior buttock.  She did bring a disc with the x-rays on it which did not demonstrate any fractures or dislocations.  She has been using a walker she does not feel like she has had any significant relief in pain as of yet  Assessment & Plan: Visit Diagnoses: Right sacrum pain  Plan: She is not quite 3 weeks from the injury.  X-rays done a few days ago are reassuring.  She does not think she has gotten any worse just not better yet.  I explained to her and her husband that she certainly could have a fracture that is not detectable on the x-ray but I think giving this a little bit more time would be fine.  She will follow-up a week from Wednesday in our office in [place] which is much closer to where they live.  I have told her to contact us if she has any worsening symptoms or   Follow-Up Instructions: No follow-ups on file.   Ortho Exam  Patient is alert, oriented, no adenopathy, well-dressed, normal affect, normal respiratory effort. It is in her legs examination she is no deformity she is a slender thin woman.  She has negative straight leg raise she has good strength with dorsiflexion plantarflexion eversion inversion of her ankle  flexion extension of her leg.  No pain with rotation of her hip and the motion is quite fluid.  She does have pain over the very lower spine at the level of the sacrum and coccyx.  Sensation is intact  Imaging: No results found. No images are attached to the encounter.  Labs: Lab Results  Component Value Date   HGBA1C 6.9 (H) 05/10/2015   HGBA1C 7.9 (H) 06/06/2014   HGBA1C 7.9 (H) 06/06/2014   REPTSTATUS 05/16/2017 FINAL 05/15/2017   CULT NO GROWTH 05/15/2017     Lab Results  Component Value Date   ALBUMIN 4.7 05/09/2021   ALBUMIN 4.4 10/31/2020   ALBUMIN 4.3 04/25/2020    Lab Results  Component Value Date   MG 2.7 (H) 01/03/2018   MG 2.6 (H) 11/07/2017   MG 2.5 (H) 08/07/2016   Lab Results  Component Value Date   VD25OH 73.2 04/13/2019   VD25OH 85.6 04/18/2018   VD25OH 41.5 03/08/2009    No results found for: PREALBUMIN    Latest Ref Rng & Units 08/31/2021   11:25 AM 05/09/2021    1:35 PM 10/31/2020    1:08 PM  CBC EXTENDED  WBC 4.0 -  10.5 K/uL 10.4   9.2   8.5    RBC 3.87 - 5.11 MIL/uL 3.84   4.04   4.11    Hemoglobin 12.0 - 15.0 g/dL 11.7   12.5   12.3    HCT 36.0 - 46.0 % 34.9   38.5   38.3    Platelets 150 - 400 K/uL 125   120   115    NEUT# 1.7 - 7.7 K/uL  6.8   6.4    Lymph# 0.7 - 4.0 K/uL  1.5   1.2       There is no height or weight on file to calculate BMI.  Orders:  No orders of the defined types were placed in this encounter.  No orders of the defined types were placed in this encounter.    Procedures: No procedures performed  Clinical Data: No additional findings.  ROS:  All other systems negative, except as noted in the HPI. Review of Systems  Objective: Vital Signs: There were no vitals taken for this visit.  Specialty Comments:  No specialty comments available.  PMFS History: Patient Active Problem List   Diagnosis Date Noted   Traumatic hematoma of right lower leg 06/12/2019   Right hip pain 04/14/2019   OSA on CPAP  04/24/2017   Genetic testing 01/14/2017   Insulin pump status 09/12/2016   S/P left mastectomy 07/29/2016   History of breast cancer 03/29/2016   Dense breast 12/11/2015   Chronic anticoagulation 12/11/2015   Hx of radiation therapy 04/23/2015   Thrombocytopenia ([place]) 04/23/2015   Iron deficiency anemia secondary to blood loss (chronic) 04/23/2015   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   Radiation therapy complication 31/54/0086   History of nodular sclerosis Hodgkin's disease 10/27/2014   Insulin dependent type 1 diabetes mellitus ([place]) 10/07/2014   Claudication ([place]) 07/29/2014   PAF (paroxysmal atrial fibrillation) ([place])    Coronary artery disease involving native coronary artery of native heart with unstable angina pectoris ([place])    Unstable angina ([place]) 04/11/2013   Chronic diastolic CHF (congestive heart failure) ([place]) 04/03/2013   Artificial cardiac pacemaker 03/12/2013   Chronic kidney disease (CKD), stage III (moderate) ([place]) 03/12/2013   Big thyroid 03/12/2013   Encounter for fitting or adjustment of insulin pump 03/12/2013   Congestive heart failure ([place]) 03/12/2013   Lipoma of neck 09/23/2012   Pacemaker 09/08/2012   Atrial flutter ([place]) 07/23/2012   Sick sinus syndrome ([place]) 07/21/2012   Gastroparesis due to DM ([place]) 05/01/2012   Hypercalcemia    Cholelithiasis 07/20/2011   Aortic stenosis status post 19 mm Edwards pericardial valve replacement 2011    Arteriosclerotic cardiovascular disease (ASCVD)    Osteopenia    NASH (nonalcoholic steatohepatitis)    Renal cell carcinoma (HCC)     Class: History of   Duodenal ulcer     Class: History of   Ischemic colitis     Class: History of   Gastroesophageal reflux disease    Hypertension    Small bowel obstruction ([place])    Hyperlipidemia 04/11/2010   Colonic polyps, adenomatous 09/29/2009   Past Medical History:  Diagnosis Date   Anxiety    Aortic stenosis    a. bicuspid aortic valve; mean  gradient of 20 mmHg in 2/10; b. s/p bioprosth AVR (19-mm Edwards pericardial valve) with a redo coronary artery bypass graft procedure in 07/2009; postoperative Dressler's syndrome     Arthritis    Atrial fibrillation ([place])    Breast cancer (  [place])    Carcinoma, renal cell ([place]) 05/2007   Laparoscopic right nephrectomy   Chromophilic renal cell carcinoma (HCC)    Chronic diastolic CHF (congestive heart failure) ([place])    a. 9.2014 EchoP EF 60-65%, no rwma, bioprosth AVR, mean gradient of 57mHg, mild to mod MR, PASP 592mg.   CKD (chronic kidney disease), stage II    CKD (chronic kidney disease), stage IV (HCC)    Colitis, ischemic ([facility]2008   Coronary artery disease    a. initial CABG with SVG-RCA in 12/05. b. redo SVG-RCA and bioprosthetic AVR in 1/11. d. 9/14 - PCI with DES to distal LM/proximal LAD was done rather than bypass as she would have been a poor candidate for redo sternotomy. d. S/p DES to prox LM 05/2014.   Diabetes mellitus 03/2010    TYPE II: Hemoglobin A1c of 7.4 in  03/2010; 8.4 in 06/2010; treated with insulin pump   Duodenal ulcer    Remote; H. pylori positive   Gastroparesis due to DM ([facility]10/04/2012   Genetic testing 01/14/2017   Ms. Sciulli underwent genetic counseling and testing for hereditary cancer syndromes on 01/01/2017. Her results were negative for mutations in all 83 genes analyzed by [employer]'s 83-gene Common Hereditary Cancers Panel. Genes analyzed include: ALK, APC, ATM, AXIN2, BAP1, BARD1, BLM, BMPR1A, BRCA1, BRCA2, BRIP1, CASR, CDC73, CDH1, CDK4, CDKN1B, CDKN1C, CDKN2A, CEBPA, CHEK2, CTNNA1, DICER1, DIS3L2,    Glaucoma 1998   Heart disease    Hodgkin's disease ([facility]1991   [place] radiation therapy   Hodgkin's disease ([facility]   Hyperlipidemia    Hyperplastic gastric polyp 06/23/2012   Hypertension    Iron deficiency anemia    a. 03/2013 EGD: essentially normal. Possible slow GIB.   Migraines    NASH (nonalcoholic steatohepatitis) 1999   -biopsy in 1999    OSA on CPAP 04/24/2017   Moderate with AHI 22/hr now on CPAP at 18cm H2o   OSA treated with BiPAP    Osteopenia     hip on DEXA in October 2007.   Osteoporosis    Oxygen deficiency    Pacemaker    six sinus syndrome   PAF (paroxysmal atrial fibrillation) ([facility]   a.  h/o brief episodes noted on ppm interrogation. b. given GI bleeding and need to be on both ASA 81 and Brilinta, risks likely outweigh benefits of anticoagulation.    Renal insufficiency    Small bowel obstruction (HCC)    Recurrent; resolved after resection of a lipoma   Syncope    a. H/o recurrent syncope with pauses on loop recorder s/p Medtronic pacemaker 07/2012.   Type 1 diabetes (HCC)     Family History  Problem Relation Age of Onset   Heart attack Mother        d.61   Heart disease Mother    Anal fissures Mother    Heart attack Father        d.6046 Diabetes Father    Heart disease Father    Lung cancer Sister        d.61 metastases to brain. History of smoking. Maternal half-sister.   Cancer Sister 30       unspecified gynecologic cancer (either cervical or ovarian) in her 3045s  Breast cancer Sister 4116     d.42 from metastatic disease. Paternal half-sister.   Hypertension Brother    Glaucoma Brother    Heart attack Maternal Grandmother    Cancer Maternal Grandfather [id number]  d.52s oral cancer   Parkinson's disease Paternal Aunt    Colon cancer Neg Hx    Stomach cancer Neg Hx     Past Surgical History:  Procedure Laterality Date   ABDOMINAL HYSTERECTOMY  2000   AORTIC VALVE REPLACEMENT  2011   Aortic valve replacement surgery with a 19 mm bioprosthetic device post redo CABG January 2011.   BONE MARROW BIOPSY     BREAST EXCISIONAL BIOPSY     benign, bilateral   CARDIAC CATHETERIZATION     CERVICAL BIOPSY     COLONOSCOPY     Multiple with adenomatous polyps   COLONOSCOPY  06/23/2012   Procedure: COLONOSCOPY;  Surgeon: Gatha Mayer, MD;  Location: [facility];  Service: Endoscopy;   Laterality: N/A;   CORONARY ARTERY BYPASS GRAFT  2005, 2011   CABG-most recent SVG to RCA-12/05; RCA occluded with patent graft in 2006; Redo bypass in 07/2009   ESOPHAGOGASTRODUODENOSCOPY     ESOPHAGOGASTRODUODENOSCOPY  06/23/2012   Procedure: ESOPHAGOGASTRODUODENOSCOPY (EGD);  Surgeon: Gatha Mayer, MD;  Location: [facility];  Service: Endoscopy;  Laterality: N/A;   ESOPHAGOGASTRODUODENOSCOPY N/A 04/06/2013   Procedure: ESOPHAGOGASTRODUODENOSCOPY (EGD);  Surgeon: Irene Shipper, MD;  Location: [facility] ENDOSCOPY;  Service: Endoscopy;  Laterality: N/A;   EXPLORATORY LAPAROTOMY W/ BOWEL RESECTION     Small bowel resection of lipoma   GIVENS CAPSULE STUDY N/A 04/30/2013   Procedure: GIVENS CAPSULE STUDY;  Surgeon: Gatha Mayer, MD;  Location: [facility];  Service: Endoscopy;  Laterality: N/A;   LEFT AND RIGHT HEART CATHETERIZATION WITH CORONARY ANGIOGRAM N/A 04/07/2013   Procedure: LEFT AND RIGHT HEART CATHETERIZATION WITH CORONARY ANGIOGRAM;  Surgeon: Larey Dresser, MD;  Location: [facility] CATH LAB;  Service: Cardiovascular;  Laterality: N/A;   LEFT ATRIAL APPENDAGE OCCLUSION N/A 02/16/2016   Watchman not placed - nickel allergy   LEFT HEART CATHETERIZATION WITH CORONARY/GRAFT ANGIOGRAM N/A 06/07/2014   Procedure: LEFT HEART CATHETERIZATION WITH Beatrix Fetters;  Surgeon: Burnell Blanks, MD;  Location[facility] C[facility] LAB;  Service: Cardiovascular;  Laterality: N/A;   LIVER BIOPSY     LOOP RECORDER IMPLANT N/A 12/10/2011   Procedure: LOOP RECORDER IMPLANT;  Surgeon: Evans Lance, MD;  Location: [facility]  CATH LAB;  Service: Cardiovascular;  Laterality: N/A;   MALONEY DILATION  06/23/2012   Procedure: [name] DILATION;  Surgeon: Gatha Mayer, MD;  Location: [facility] ENDOSCOPY;  Service: Endoscopy;;  6 fr   MASTECTOMY Left 05/17/2015   total    NEPHRECTOMY  2008   Laparoscopic right nephrectomy, renal cell carcinoma   PACEMAKER INSERTION  08/21/2012   Medtronic Adapta L dual-chamber pacemaker    PERCUTANEOUS CORONARY STENT INTERVENTION (PCI-S) N/A 04/09/2013   Procedure: PERCUTANEOUS CORONARY STENT INTERVENTION (PCI-S);  Surgeon: Burnell Blanks, MD;  Location: [facility] CATH LAB;  Service: Cardiovascular;  Laterality: N/A;   PERMANENT PACEMAKER INSERTION N/A 08/21/2012   Procedure: PERMANENT PACEMAKER INSERTION;  Surgeon: Evans Lance, MD;  Location[facility] CATH LAB;  Service: Cardiovascular;  Laterality: N/A;   RIGHT HEART CATH N/A 03/18/2019   Procedure: RIGHT HEART CATH;  Surgeon: Larey Dresser, MD;  Location: [facility] CV LAB;  Service: Cardiovascular;  Laterality: N/A;   RIGHT HEART CATH AND CORONARY/GRAFT ANGIOGRAPHY N/A 04/19/2017   Procedure: RIGHT HEART CATH AND CORONARY/GRAFT ANGIOGRAPHY;  Surgeon: Larey Dresser, MD;  Location: [facility] CV LAB;  Service: Cardiovascular;  Laterality: N/A;   TEE WITHOUT CARDIOVERSION N/A 12/07/2015   Procedure: TRANSESOPHAGEAL ECHOCARDIOGRAM (TEE);  Surgeon: Elby Showers  Aundra Dubin, MD;  Location: [facility];  Service: Cardiovascular;  Laterality: N/A;   TOTAL ABDOMINAL HYSTERECTOMY W/ BILATERAL SALPINGOOPHORECTOMY  2000   endometriosis and ovarian cysts   TOTAL MASTECTOMY Left 05/17/2015   Procedure: LEFT TOTAL MASTECTOMY;  Surgeon: Rolm Bookbinder, MD;  Location: [facility];  Service: General;  Laterality: Left;   TUMOR EXCISION  1981   removal of Hodgkins lymphoma   Social History   Occupational History   Occupation: retired    Comment: elementary principal  Tobacco Use   Smoking status: Never   Smokeless tobacco: Never  Vaping Use   Vaping Use: Never used  Substance and Sexual Activity   Alcohol use: No    Comment: social, 39s   Drug use: Never   Sexual activity: Yes

## 2021-12-12 ENCOUNTER — Ambulatory Visit: Payer: Medicare PPO | Admitting: Orthopaedic Surgery

## 2021-12-13 ENCOUNTER — Other Ambulatory Visit (HOSPITAL_COMMUNITY): Payer: Self-pay | Admitting: Cardiology

## 2021-12-13 DIAGNOSIS — I6523 Occlusion and stenosis of bilateral carotid arteries: Secondary | ICD-10-CM

## 2021-12-19 DIAGNOSIS — N390 Urinary tract infection, site not specified: Secondary | ICD-10-CM | POA: Diagnosis not present

## 2021-12-19 DIAGNOSIS — I4891 Unspecified atrial fibrillation: Secondary | ICD-10-CM | POA: Diagnosis not present

## 2021-12-19 DIAGNOSIS — N184 Chronic kidney disease, stage 4 (severe): Secondary | ICD-10-CM | POA: Diagnosis not present

## 2021-12-19 DIAGNOSIS — E1122 Type 2 diabetes mellitus with diabetic chronic kidney disease: Secondary | ICD-10-CM | POA: Diagnosis not present

## 2021-12-19 DIAGNOSIS — E785 Hyperlipidemia, unspecified: Secondary | ICD-10-CM | POA: Diagnosis not present

## 2021-12-19 DIAGNOSIS — D649 Anemia, unspecified: Secondary | ICD-10-CM | POA: Diagnosis not present

## 2021-12-20 ENCOUNTER — Ambulatory Visit (INDEPENDENT_AMBULATORY_CARE_PROVIDER_SITE_OTHER): Payer: Medicare PPO

## 2021-12-20 ENCOUNTER — Ambulatory Visit (INDEPENDENT_AMBULATORY_CARE_PROVIDER_SITE_OTHER): Payer: Medicare PPO | Admitting: Orthopaedic Surgery

## 2021-12-20 ENCOUNTER — Encounter: Payer: Self-pay | Admitting: Orthopaedic Surgery

## 2021-12-20 DIAGNOSIS — M25551 Pain in right hip: Secondary | ICD-10-CM | POA: Diagnosis not present

## 2021-12-20 DIAGNOSIS — N184 Chronic kidney disease, stage 4 (severe): Secondary | ICD-10-CM | POA: Diagnosis not present

## 2021-12-20 DIAGNOSIS — Z9641 Presence of insulin pump (external) (internal): Secondary | ICD-10-CM | POA: Diagnosis not present

## 2021-12-20 DIAGNOSIS — Z4681 Encounter for fitting and adjustment of insulin pump: Secondary | ICD-10-CM | POA: Diagnosis not present

## 2021-12-20 DIAGNOSIS — E1022 Type 1 diabetes mellitus with diabetic chronic kidney disease: Secondary | ICD-10-CM | POA: Diagnosis not present

## 2021-12-20 DIAGNOSIS — E785 Hyperlipidemia, unspecified: Secondary | ICD-10-CM | POA: Diagnosis not present

## 2021-12-20 NOTE — Progress Notes (Signed)
 Office Visit Note   Patient: [name] [name]           Date of Birth: 01/24/1951           MRN: [id number] Visit Date: 12/20/2021              Requested by: Hagler, Rachel, MD [id number] [place],  [place] [address] PCP: Hagler, Rachel, MD   Assessment & Plan: Visit Diagnoses:  1. Pain in right hip     Plan: [name] [name] is a type 1 insulin-dependent diabetic being evaluated for injuries sustained in a fall about 3 weeks ago.  She notes falling directly on the lateral aspect of her right hip and buttocks.  Initially she had considerable pain to the point where she had difficulty ambulating.  She was camping with her husband at the time and the next day went to a local urgent care facility and with negative x-rays.  She notes that she is better but still having some pain bearing weight and uses a rolling walker.  She localizes her pain over the ischial tuberosity and greater trochanter of the right hip.  Repeat films today did not demonstrate any fracture about the right hip or hemipelvis.  She is really not experiencing any back pain.  She also has not had any referred pain to either lower extremity.  She denies any numbness or tingling.  She never had any bruising.  She was tender over the greater trochanter and over the ischial tuberosity with painless range of motion of her hip  Suspect.  Problem is soft tissue in nature with probable ischial bursitis or possibly gluteus bruise.  She also has some tenderness over the greater trochanter.  Because of her diabetes and poor control of the sugars I am hesitant to give her cortisone injection but she could try Voltaren gel.  I really think that she will continue to improve but slowly.  Would like to check her back in several weeks.  All questions were answered  Follow-Up Instructions: Return in about 2 weeks (around 01/03/2022).   Orders:  Orders Placed This Encounter  Procedures   XR HIP UNILAT W OR W/O PELVIS 2-3 VIEWS RIGHT   XR  Lumbar Spine 2-3 Views   No orders of the defined types were placed in this encounter.     Procedures: No procedures performed   Clinical Data: No additional findings.   Subjective: Chief Complaint  Patient presents with   Right Hip - Pain, Follow-up  Patient presents today for a follow up on her right hip. She fell backwards on 11/30/2021. She saw [facility] afterwards. She had x-rays at [facility] at the time of the fall, but did not bring those with her today. She said that she has noticed some improvement, and is now able to ambulate with a walker. Her pain has shifted from her buttock to her lateral right hip and groin area. She does not take anything for pain. She uses a heating pad.   HPI  Review of Systems   Objective: Vital Signs: There were no vitals taken for this visit.  Physical Exam Constitutional:      Appearance: She is well-developed.  Eyes:     Pupils: Pupils are equal, round, and reactive to light.  Pulmonary:     Effort: Pulmonary effort is normal.  Skin:    General: Skin is warm and dry.  Neurological:     Mental Status: She is alert and oriented to person,   place, and time.  Psychiatric:        Behavior: Behavior normal.    Ortho Exam awake alert and oriented x3.  Comfortable sitting.  Straight leg raise was negative.  She had painless range of motion of both of her hips but does have local tenderness directly over the greater trochanter of the right hip and over the ischial prominence of the right buttock.  No skin changes no bruising.  No percussible back pain.  No thigh pain.  Does have some altered sensibility in of her feet related to the diabetic neuropathy but no change  Specialty Comments:  No specialty comments available.  Imaging: XR HIP UNILAT W OR W/O PELVIS 2-3 VIEWS RIGHT  Result Date: 12/20/2021 AP pelvis and lateral of the right hip were obtained without evidence of any fracture of the hip, greater trochanter or right hemipelvis.  No  obvious degenerative change of the hip.  Some sclerosis about the SI joint.  No obvious fracture  XR Lumbar Spine 2-3 Views  Result Date: 12/20/2021 Films of the lumbar spine at obtained in 2 projections.  No evidence of a scoliosis or listhesis.  There is some mild calcification of the abdominal aorta but without obvious aneurysmal dilatation.  Disc spaces were well maintained.  Mild facet sclerosis at L5-S1.  No obvious spondylolysis.  Sclerosis about the SI joints more left than right.  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   Radiation therapy complication 10/27/2014   History of nodular sclerosis Hodgkin's disease 10/27/2014   Insulin dependent type 1 diabetes mellitus (HCC) 10/07/2014   Claudication (HCC) 07/29/2014   PAF (paroxysmal atrial fibrillation) (HCC)    Coronary artery disease involving native coronary artery of native heart with unstable angina pectoris (HCC)    Unstable angina (HCC) 04/11/2013   Chronic diastolic CHF (congestive heart failure) (HCC) 04/03/2013   Artificial cardiac pacemaker 03/12/2013   Chronic kidney disease (CKD), stage III (moderate) (HCC) 03/12/2013   Big thyroid 03/12/2013   Encounter for fitting or adjustment of insulin pump 03/12/2013   Congestive heart failure (HCC) 03/12/2013   Lipoma of neck 09/23/2012   Pacemaker 09/08/2012   Atrial flutter (HCC) 07/23/2012   Sick  sinus syndrome (HCC) 07/21/2012   Gastroparesis due to DM (HCC) 05/01/2012   Hypercalcemia    Cholelithiasis 07/20/2011   Aortic stenosis status post 19 mm Edwards pericardial valve replacement 2011    Arteriosclerotic cardiovascular disease (ASCVD)    Osteopenia    NASH (nonalcoholic steatohepatitis)    Renal cell carcinoma (HCC)     Class: History of   Duodenal ulcer     Class: History of   Ischemic colitis     Class: History of   Gastroesophageal reflux disease    Hypertension    Small bowel obstruction (HCC)    Hyperlipidemia 04/11/2010   Colonic polyps, adenomatous 09/29/2009   Past Medical   History:  Diagnosis Date   Anxiety    Aortic stenosis    a. bicuspid aortic valve; mean gradient of 20 mmHg in 2/10; b. s/p bioprosth AVR (19-mm Edwards pericardial valve) with a redo coronary artery bypass graft procedure in 07/2009; postoperative Dressler's syndrome     Arthritis    Atrial fibrillation (HCC)    Breast cancer (HCC)    Carcinoma, renal cell (HCC) 05/2007   Laparoscopic right nephrectomy   Chromophilic renal cell carcinoma (HCC)    Chronic diastolic CHF (congestive heart failure) (HCC)    a. 9.2014 EchoP EF 60-65%, no rwma, bioprosth AVR, mean gradient of 15mmHg, mild to mod MR, PASP 51mmHg.   CKD (chronic kidney disease), stage II    CKD (chronic kidney disease), stage IV (HCC)    Colitis, ischemic (HCC) 2008   Coronary artery disease    a. initial CABG with SVG-RCA in 12/05. b. redo SVG-RCA and bioprosthetic AVR in 1/11. d. 9/14 - PCI with DES to distal LM/proximal LAD was done rather than bypass as she would have been a poor candidate for redo sternotomy. d. S/p DES to prox LM 05/2014.   Diabetes mellitus 03/2010    TYPE II: Hemoglobin A1c of 7.4 in  03/2010; 8.4 in 06/2010; treated with insulin pump   Duodenal ulcer    Remote; H. pylori positive   Gastroparesis due to DM (HCC) 05/01/2012   Genetic testing 01/14/2017   Ms. Vanyo underwent genetic counseling and  testing for hereditary cancer syndromes on 01/01/2017. Her results were negative for mutations in all 83 genes analyzed by [facility]'s 83-gene Common Hereditary Cancers Panel. Genes analyzed include: ALK, APC, ATM, AXIN2, BAP1, BARD1, BLM, BMPR1A, BRCA1, BRCA2, BRIP1, CASR, CDC73, CDH1, CDK4, CDKN1B, CDKN1C, CDKN2A, CEBPA, CHEK2, CTNNA1, DICER1, DIS3L2,    Glaucoma 1998   Heart disease    Hodgkin's disease (HCC) 1991   Mantle radiation therapy   Hodgkin's disease (HCC)    Hyperlipidemia    Hyperplastic gastric polyp 06/23/2012   Hypertension    Iron deficiency anemia    a. 03/2013 EGD: essentially normal. Possible slow GIB.   Migraines    NASH (nonalcoholic steatohepatitis) 1999   -biopsy in 1999   OSA on CPAP 04/24/2017   Moderate with AHI 22/hr now on CPAP at 18cm H2o   OSA treated with BiPAP    Osteopenia     hip on DEXA in October 2007.   Osteoporosis    Oxygen deficiency    Pacemaker    six sinus syndrome   PAF (paroxysmal atrial fibrillation) (HCC)    a.  h/o brief episodes noted on ppm interrogation. b. given GI bleeding and need to be on both ASA 81 and Brilinta, risks likely outweigh benefits of anticoagulation.    Renal insufficiency    Small bowel obstruction (HCC)    Recurrent; resolved after resection of a lipoma   Syncope    a. H/o recurrent syncope with pauses on loop recorder s/p Medtronic pacemaker 07/2012.   Type 1 diabetes (HCC)     Family History  Problem Relation Age of Onset   Heart attack Mother        d.61   Heart disease Mother    Anal fissures Mother    Heart attack Father        d.60   Diabetes Father    Heart disease Father    Lung cancer Sister        d.61 metastases to brain. History of smoking. Maternal   half-sister.   Cancer Sister 30       unspecified gynecologic cancer (either cervical or ovarian) in her 73s.   Breast cancer Sister 51       d.42 from metastatic disease. Paternal half-sister.   Hypertension Brother    Glaucoma Brother     Heart attack Maternal Grandmother    Cancer Maternal Grandfather 34       d.52s oral cancer   Parkinson's disease Paternal Aunt    Colon cancer Neg Hx    Stomach cancer Neg Hx     Past Surgical History:  Procedure Laterality Date   ABDOMINAL HYSTERECTOMY  2000   AORTIC VALVE REPLACEMENT  2011   Aortic valve replacement surgery with a 19 mm bioprosthetic device post redo CABG January 2011.   BONE MARROW BIOPSY     BREAST EXCISIONAL BIOPSY     benign, bilateral   CARDIAC CATHETERIZATION     CERVICAL BIOPSY     COLONOSCOPY     Multiple with adenomatous polyps   COLONOSCOPY  06/23/2012   Procedure: COLONOSCOPY;  Surgeon: Gatha Mayer, MD;  Location: [facility] ENDOSCOPY;  Service: Endoscopy;  Laterality: N/A;   CORONARY ARTERY BYPASS GRAFT  2005, 2011   CABG-most recent SVG to RCA-12/05; RCA occluded with patent graft in 2006; Redo bypass in 07/2009   ESOPHAGOGASTRODUODENOSCOPY     ESOPHAGOGASTRODUODENOSCOPY  06/23/2012   Procedure: ESOPHAGOGASTRODUODENOSCOPY (EGD);  Surgeon: Gatha Mayer, MD;  Location: [facility];  Service: Endoscopy;  Laterality: N/A;   ESOPHAGOGASTRODUODENOSCOPY N/A 04/06/2013   Procedure: ESOPHAGOGASTRODUODENOSCOPY (EGD);  Surgeon: Irene Shipper, MD;  Location: [facility] ENDOSCOPY;  Service: Endoscopy;  Laterality: N/A;   EXPLORATORY LAPAROTOMY W/ BOWEL RESECTION     Small bowel resection of lipoma   GIVENS CAPSULE STUDY N/A 04/30/2013   Procedure: GIVENS CAPSULE STUDY;  Surgeon: Gatha Mayer, MD;  Location: [facility];  Service: Endoscopy;  Laterality: N/A;   LEFT AND RIGHT HEART CATHETERIZATION WITH CORONARY ANGIOGRAM N/A 04/07/2013   Procedure: LEFT AND RIGHT HEART CATHETERIZATION WITH CORONARY ANGIOGRAM;  Surgeon: Larey Dresser, MD;  Location: [facility] CATH LAB;  Service: Cardiovascular;  Laterality: N/A;   LEFT ATRIAL APPENDAGE OCCLUSION N/A 02/16/2016   Watchman not placed - nickel allergy   LEFT HEART CATHETERIZATION WITH CORONARY/GRAFT ANGIOGRAM N/A 06/07/2014    Procedure: LEFT HEART CATHETERIZATION WITH Beatrix Fetters;  Surgeon: Burnell Blanks, MD;  Location: [facility] CATH LAB;  Service: Cardiovascular;  Laterality: N/A;   LIVER BIOPSY     LOOP RECORDER IMPLANT N/A 12/10/2011   Procedure: LOOP RECORDER IMPLANT;  Surgeon: Evans Lance, MD;  Location: [facility] CATH LAB;  Service: Cardiovascular;  Laterality: N/A;   MALONEY DILATION  06/23/2012   Procedure: [name] DILATION;  Surgeon: Gatha Mayer, MD;  Location: [facility] ENDOSCOPY;  Service: Endoscopy;;  61 fr   MASTECTOMY Left 05/17/2015   total    NEPHRECTOMY  2008   Laparoscopic right nephrectomy, renal cell carcinoma   PACEMAKER INSERTION  08/21/2012   Medtronic Adapta L dual-chamber pacemaker   PERCUTANEOUS CORONARY STENT INTERVENTION (PCI-S) N/A 04/09/2013   Procedure: PERCUTANEOUS CORONARY STENT INTERVENTION (PCI-S);  Surgeon: Burnell Blanks, MD;  Location: [facility] CATH LAB;  Service: Cardiovascular;  Laterality: N/A;   PERMANENT PACEMAKER INSERTION N/A 08/21/2012   Procedure: PERMANENT PACEMAKER INSERTION;  Surgeon: Evans Lance, MD;  Location: [facility] CATH LAB;  Service: Cardiovascular;  Laterality: N/A;   RIGHT HEART CATH N/A 03/18/2019   Procedure: RIGHT HEART CATH;  Surgeon: Aundra Dubin,  [name] S, MD;  Location: [facility] INVASIVE CV LAB;  Service: Cardiovascular;  Laterality: N/A;   RIGHT HEART CATH AND CORONARY/GRAFT ANGIOGRAPHY N/A 04/19/2017   Procedure: RIGHT HEART CATH AND CORONARY/GRAFT ANGIOGRAPHY;  Surgeon: McLean, [name] S, MD;  Location: [facility] INVASIVE CV LAB;  Service: Cardiovascular;  Laterality: N/A;   TEE WITHOUT CARDIOVERSION N/A 12/07/2015   Procedure: TRANSESOPHAGEAL ECHOCARDIOGRAM (TEE);  Surgeon: [name] S McLean, MD;  Location: [facility] ENDOSCOPY;  Service: Cardiovascular;  Laterality: N/A;   TOTAL ABDOMINAL HYSTERECTOMY W/ BILATERAL SALPINGOOPHORECTOMY  2000   endometriosis and ovarian cysts   TOTAL MASTECTOMY Left 05/17/2015   Procedure: LEFT TOTAL MASTECTOMY;  Surgeon: Matthew  Wakefield, MD;  Location: [facility] OR;  Service: General;  Laterality: Left;   TUMOR EXCISION  1981   removal of Hodgkins lymphoma   Social History   Occupational History   Occupation: retired    Comment: elementary principal  Tobacco Use   Smoking status: Never   Smokeless tobacco: Never  Vaping Use   Vaping Use: Never used  Substance and Sexual Activity   Alcohol use: No    Comment: social, 20s   Drug use: Never   Sexual activity: Yes        

## 2021-12-21 ENCOUNTER — Ambulatory Visit: Payer: Medicare PPO | Admitting: Neurology

## 2021-12-21 DIAGNOSIS — Z7189 Other specified counseling: Secondary | ICD-10-CM | POA: Diagnosis not present

## 2021-12-21 DIAGNOSIS — Z4681 Encounter for fitting and adjustment of insulin pump: Secondary | ICD-10-CM | POA: Diagnosis not present

## 2021-12-21 DIAGNOSIS — E109 Type 1 diabetes mellitus without complications: Secondary | ICD-10-CM | POA: Diagnosis not present

## 2021-12-27 ENCOUNTER — Encounter (HOSPITAL_COMMUNITY): Payer: Self-pay | Admitting: Cardiology

## 2021-12-28 ENCOUNTER — Other Ambulatory Visit (HOSPITAL_COMMUNITY): Payer: Self-pay

## 2021-12-28 MED ORDER — METOPROLOL SUCCINATE ER 25 MG PO TB24: 25.0000 mg | ORAL_TABLET | Freq: Every day | ORAL | 3 refills | Status: DC

## 2021-12-29 DIAGNOSIS — H401131 Primary open-angle glaucoma, bilateral, mild stage: Secondary | ICD-10-CM | POA: Diagnosis not present

## 2022-01-02 ENCOUNTER — Ambulatory Visit: Payer: Medicare PPO | Admitting: Orthopaedic Surgery

## 2022-01-02 ENCOUNTER — Ambulatory Visit (INDEPENDENT_AMBULATORY_CARE_PROVIDER_SITE_OTHER): Payer: Medicare PPO

## 2022-01-02 DIAGNOSIS — I495 Sick sinus syndrome: Secondary | ICD-10-CM | POA: Diagnosis not present

## 2022-01-02 LAB — CUP PACEART REMOTE DEVICE CHECK
Battery Impedance: 970 Ohm
Battery Remaining Longevity: 71 mo
Battery Voltage: 2.79 V
Brady Statistic AP VP Percent: 0 %
Brady Statistic AP VS Percent: 0 %
Brady Statistic AS VP Percent: 0 %
Brady Statistic AS VS Percent: 100 %
Date Time Interrogation Session: 20230613120156
Implantable Lead Implant Date: 20140130
Implantable Lead Implant Date: 20140130
Implantable Lead Location: 753859
Implantable Lead Location: 753860
Implantable Lead Model: 5076
Implantable Lead Model: 5076
Implantable Pulse Generator Implant Date: 20140130
Lead Channel Impedance Value: 423 Ohm
Lead Channel Impedance Value: 574 Ohm
Lead Channel Pacing Threshold Amplitude: 0.375 V
Lead Channel Pacing Threshold Amplitude: 0.625 V
Lead Channel Pacing Threshold Pulse Width: 0.4 ms
Lead Channel Pacing Threshold Pulse Width: 0.4 ms
Lead Channel Setting Pacing Amplitude: 1.5 V
Lead Channel Setting Pacing Amplitude: 2.5 V
Lead Channel Setting Pacing Pulse Width: 0.4 ms
Lead Channel Setting Sensing Sensitivity: 5.6 mV

## 2022-01-03 DIAGNOSIS — R911 Solitary pulmonary nodule: Secondary | ICD-10-CM | POA: Diagnosis not present

## 2022-01-03 DIAGNOSIS — J90 Pleural effusion, not elsewhere classified: Secondary | ICD-10-CM | POA: Diagnosis not present

## 2022-01-09 ENCOUNTER — Encounter: Payer: Self-pay | Admitting: Neurology

## 2022-01-09 ENCOUNTER — Ambulatory Visit: Payer: Medicare PPO | Admitting: Neurology

## 2022-01-09 VITALS — BP 134/68 | HR 76 | Ht 62.0 in | Wt 118.0 lb

## 2022-01-09 DIAGNOSIS — R278 Other lack of coordination: Secondary | ICD-10-CM | POA: Diagnosis not present

## 2022-01-09 DIAGNOSIS — Z9181 History of falling: Secondary | ICD-10-CM | POA: Diagnosis not present

## 2022-01-09 DIAGNOSIS — R2689 Other abnormalities of gait and mobility: Secondary | ICD-10-CM

## 2022-01-09 DIAGNOSIS — G20 Parkinson's disease: Secondary | ICD-10-CM | POA: Diagnosis not present

## 2022-01-09 NOTE — Patient Instructions (Signed)
It was nice to see you again today and meet your husband as well.  Please continue to maintain a healthy lifestyle, good nutrition, good hydration with water, regular exercise within your limitations.  Try to be consistent with your BiPAP.  Please try to stand up slowly and avoid uneven grounds or standing on inclines.

## 2022-01-09 NOTE — Progress Notes (Signed)
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 well-developed and well-nourished and well groomed.   HEENT: Normocephalic, atraumatic, pupils are equal, round and reactive to light, extraocular tracking is good without limitation but with mild saccadic breakdown.  Corrective eyeglasses in place.  Status post cataract repairs bilaterally.  No nystagmus, face is symmetric with mild degree of facial masking, mild nuchal rigidity noted, speech is mildly hypophonic, no dysarthria. Airway examination reveals no sialorrhea, moderate mouth dryness noted.  Tongue protrudes centrally and palate elevates symmetrically.  [name] [name] does have a slightly decreased eye blink rate.  Hearing is grossly intact.  No carotid bruits.    Chest: Clear to auscultation without wheezing, rhonchi or crackles noted.   Heart: Q7+H4+1, loud heart click, regular, possible systolic murmur noted.     Abdomen: Soft, non-tender and non-distended.   Extremities: There is no pitting edema in [name] distal lower extremities bilaterally.    Skin: Warm and dry without trophic changes noted.    Musculoskeletal: exam reveals no obvious joint deformities. [name] [name] does have increased kyphosis in [name] upper back.   Neurologically:  Mental status: [name] [name] is awake, alert and oriented in all 4 spheres. [name] [name] immediate and remote memory, attention, language skills and fund of knowledge are appropriate. There is no evidence of aphasia, agnosia, apraxia or anomia. Speech is as above.  Thought process is linear. Mood is normal and affect is normal.  Cranial nerves II - XII are as described above under HEENT exam.  Motor exam: Thin bulk, global strength of 4+ out of 5, no resting tremor.  No telltale cogwheeling, no significant increase in tone noted.  Fine motor skill testing shows overall mild slowness throughout in [name] upper and lower extremities, mild global impairment of fine motor skills without lateralization.  Fairly stable findings.     (On 03/15/21: Handwriting  is legible but very small.)   Cerebellar testing: No dysmetria or intention tremor. There is no truncal or gait ataxia.  Sensory exam: intact to light touch in [name] upper and lower extremities.  Gait, station and balance: [name] [name] stands slowly, [name] [name] stands initially slightly wide-based but is able to stand with narrow base.  [name] [name] walks slowly, mild decrease in arm swing noted.  Balance is mildly impaired.  No telltale shuffling, no walking aid.                Assessment and Plan:  In summary, JOSEPHA BARBIER is a very pleasant 71 year old female with an underlying complex medical history of aortic stenosis, renal cell cancer, breast cancer, chronic diastolic congestive heart failure, chronic kidney disease, ischemic colitis, coronary artery disease, diabetes, duodenal ulcer, gastroparesis, Hodgkin's disease, hyperlipidemia, hypertension, anemia, migraine headaches, sleep apnea, on BiPAP therapy, pacemaker, paroxysmal A. fib, small bowel obstruction, NASH, history of syncope, status post multiple surgeries including CABG, AVR, loop recorder placement, left mastectomy, right nephrectomy, Hodgkin's lymphoma surgery, pacemaker placement, small bowel lipoma resection, who presents for follow-up consultation of [name] [name] fine motor dyscontrol, balance issues, and tremors.  [name] [name] has restless leg syndrome and is stable on low-dose ropinirole through [name] [name] primary care.  [name] [name]  has evidence of parkinsonism without telltale history and examination findings of idiopathic Parkinson's disease.  [name] [name] tried levodopa in low-dose but could not tolerate [name] medication secondary to several side effects including sedation primarily.  We talked about [name] importance of fall prevention.  [name] [name] had a recent fall.  We talked about [name] importance of maintaining a healthy lifestyle and trying to exercise on a regular basis in part to continue to strengthen [name] [name] muscles.  [name] [name] is advised to stand up slowly and get [name] [name] bearings and try to hydrate well within  [name] recommended limitations.  [name] [name] is advised to continue follow-up with [name] [name] other specialists and I will check in with [name] [name] in about 6 months, sooner if needed. I answered all their questions today and [name] [name] and [name] [name] husband were in agreement.  I spent 30 minutes in total face-to-face time and in reviewing records during pre-charting, more than 50% of which was spent in counseling and coordination of care, reviewing test results, reviewing medications and treatment regimen and/or in discussing or reviewing [name] diagnosis of parkinsonism, [name] prognosis and treatment options. Pertinent laboratory and imaging test results that were available during this visit with [name] [name] were reviewed by me and considered in my medical decision making (see chart for details).

## 2022-01-10 ENCOUNTER — Other Ambulatory Visit (HOSPITAL_COMMUNITY): Payer: Self-pay | Admitting: Cardiology

## 2022-01-17 NOTE — Progress Notes (Signed)
Remote pacemaker transmission.   

## 2022-01-24 DIAGNOSIS — E1021 Type 1 diabetes mellitus with diabetic nephropathy: Secondary | ICD-10-CM | POA: Diagnosis not present

## 2022-01-30 ENCOUNTER — Encounter: Payer: Self-pay | Admitting: Cardiology

## 2022-01-30 DIAGNOSIS — G4733 Obstructive sleep apnea (adult) (pediatric): Secondary | ICD-10-CM

## 2022-01-30 DIAGNOSIS — I10 Essential (primary) hypertension: Secondary | ICD-10-CM

## 2022-01-30 DIAGNOSIS — I5032 Chronic diastolic (congestive) heart failure: Secondary | ICD-10-CM

## 2022-01-31 DIAGNOSIS — E1021 Type 1 diabetes mellitus with diabetic nephropathy: Secondary | ICD-10-CM | POA: Diagnosis not present

## 2022-01-31 DIAGNOSIS — Z9641 Presence of insulin pump (external) (internal): Secondary | ICD-10-CM | POA: Diagnosis not present

## 2022-02-12 DIAGNOSIS — E10649 Type 1 diabetes mellitus with hypoglycemia without coma: Secondary | ICD-10-CM | POA: Diagnosis not present

## 2022-02-15 ENCOUNTER — Ambulatory Visit: Payer: Medicare PPO | Admitting: Neurology

## 2022-02-20 ENCOUNTER — Encounter (HOSPITAL_COMMUNITY): Payer: Self-pay | Admitting: Cardiology

## 2022-02-20 ENCOUNTER — Ambulatory Visit (HOSPITAL_COMMUNITY)
Admission: RE | Admit: 2022-02-20 | Discharge: 2022-02-20 | Disposition: A | Discharge status: Home / Self Care | Payer: Medicare PPO | Source: Ambulatory Visit | Attending: Cardiology | Admitting: Cardiology

## 2022-02-20 ENCOUNTER — Other Ambulatory Visit (HOSPITAL_COMMUNITY): Payer: Self-pay | Admitting: Cardiology

## 2022-02-20 VITALS — BP 118/60 | HR 73 | Wt 114.4 lb

## 2022-02-20 DIAGNOSIS — N184 Chronic kidney disease, stage 4 (severe): Secondary | ICD-10-CM | POA: Insufficient documentation

## 2022-02-20 DIAGNOSIS — Z8249 Family history of ischemic heart disease and other diseases of the circulatory system: Secondary | ICD-10-CM | POA: Insufficient documentation

## 2022-02-20 DIAGNOSIS — Q231 Congenital insufficiency of aortic valve: Secondary | ICD-10-CM | POA: Insufficient documentation

## 2022-02-20 DIAGNOSIS — D509 Iron deficiency anemia, unspecified: Secondary | ICD-10-CM | POA: Diagnosis not present

## 2022-02-20 DIAGNOSIS — G4733 Obstructive sleep apnea (adult) (pediatric): Secondary | ICD-10-CM | POA: Insufficient documentation

## 2022-02-20 DIAGNOSIS — Z7902 Long term (current) use of antithrombotics/antiplatelets: Secondary | ICD-10-CM | POA: Insufficient documentation

## 2022-02-20 DIAGNOSIS — Z923 Personal history of irradiation: Secondary | ICD-10-CM | POA: Insufficient documentation

## 2022-02-20 DIAGNOSIS — D696 Thrombocytopenia, unspecified: Secondary | ICD-10-CM | POA: Insufficient documentation

## 2022-02-20 DIAGNOSIS — I495 Sick sinus syndrome: Secondary | ICD-10-CM | POA: Diagnosis not present

## 2022-02-20 DIAGNOSIS — I5032 Chronic diastolic (congestive) heart failure: Secondary | ICD-10-CM

## 2022-02-20 DIAGNOSIS — Z8571 Personal history of Hodgkin lymphoma: Secondary | ICD-10-CM | POA: Diagnosis not present

## 2022-02-20 DIAGNOSIS — Z950 Presence of cardiac pacemaker: Secondary | ICD-10-CM | POA: Insufficient documentation

## 2022-02-20 DIAGNOSIS — Z79899 Other long term (current) drug therapy: Secondary | ICD-10-CM | POA: Insufficient documentation

## 2022-02-20 DIAGNOSIS — I130 Hypertensive heart and chronic kidney disease with heart failure and stage 1 through stage 4 chronic kidney disease, or unspecified chronic kidney disease: Secondary | ICD-10-CM | POA: Diagnosis not present

## 2022-02-20 DIAGNOSIS — I2510 Atherosclerotic heart disease of native coronary artery without angina pectoris: Secondary | ICD-10-CM | POA: Insufficient documentation

## 2022-02-20 DIAGNOSIS — Z905 Acquired absence of kidney: Secondary | ICD-10-CM | POA: Diagnosis not present

## 2022-02-20 DIAGNOSIS — E785 Hyperlipidemia, unspecified: Secondary | ICD-10-CM

## 2022-02-20 DIAGNOSIS — Z953 Presence of xenogenic heart valve: Secondary | ICD-10-CM | POA: Insufficient documentation

## 2022-02-20 DIAGNOSIS — I480 Paroxysmal atrial fibrillation: Secondary | ICD-10-CM | POA: Diagnosis not present

## 2022-02-20 LAB — BRAIN NATRIURETIC PEPTIDE: B Natriuretic Peptide: 101.5 pg/mL — ABNORMAL HIGH (ref 0.0–100.0)

## 2022-02-20 LAB — LIPID PANEL
Cholesterol: 107 mg/dL (ref 0–200)
HDL: 33 mg/dL — ABNORMAL LOW (ref >40)
LDL Cholesterol: 39 mg/dL (ref 0–99)
Total CHOL/HDL Ratio: 3.2 RATIO
Triglycerides: 176 mg/dL — ABNORMAL HIGH (ref <150)
VLDL: 35 mg/dL (ref 0–40)

## 2022-02-20 LAB — BASIC METABOLIC PANEL
Anion gap: 9 (ref 5–15)
BUN: 93 mg/dL — ABNORMAL HIGH (ref 8–23)
CO2: 27 mmol/L (ref 22–32)
Calcium: 10.4 mg/dL — ABNORMAL HIGH (ref 8.9–10.3)
Chloride: 101 mmol/L (ref 98–111)
Creatinine, Ser: 2.76 mg/dL — ABNORMAL HIGH (ref 0.44–1.00)
GFR, Estimated: 18 mL/min — ABNORMAL LOW (ref >60)
Glucose, Bld: 137 mg/dL — ABNORMAL HIGH (ref 70–99)
Potassium: 4.6 mmol/L (ref 3.5–5.1)
Sodium: 137 mmol/L (ref 135–145)

## 2022-02-20 NOTE — Patient Instructions (Signed)
EKG done today.  Labs done today. We will contact you only if your labs are abnormal.  No medication changes were made. Please continue all current medications as prescribed.  Your physician recommends that you schedule a follow-up appointment in: 3 months  If you have any questions or concerns before your next appointment please send [name] a message through [place] or call our office at [phone].    TO LEAVE A MESSAGE FOR THE NURSE SELECT OPTION 2, PLEASE LEAVE A MESSAGE INCLUDING: YOUR NAME DATE OF BIRTH CALL BACK NUMBER REASON FOR CALL**this is important as we prioritize the call backs  YOU WILL RECEIVE A CALL BACK THE SAME DAY AS LONG AS YOU CALL BEFORE 4:00 PM   Do the following things EVERYDAY: Weigh yourself in the morning before breakfast. Write it down and keep it in a log. Take your medicines as prescribed Eat low salt foods--Limit salt (sodium) to 2000 mg per day.  Stay as active as you can everyday Limit all fluids for the day to less than 2 liters   At the [facility], you and your health needs are our priority. As part of our continuing mission to provide you with exceptional heart care, we have created designated Provider Care Teams. These Care Teams include your primary Cardiologist (physician) and Advanced Practice Providers (APPs- Physician Assistants and Nurse Practitioners) who all work together to provide you with the care you need, when you need it.   You may see any of the following providers on your designated Care Team at your next follow up: Dr Glori Bickers Dr Haynes Kerns, NP Lyda Jester, [place] Audry Riles, PharmD   Please be sure to bring in all your medications bottles to every appointment.

## 2022-02-20 NOTE — Progress Notes (Signed)
Date:  02/20/2022   ID:  [name] [name], DOB [birth year], MRN [id number]   Provider location: [place] Type of Visit: Established patient  PCP:  [name], MD  Cardiologist:  None Primary HF: Dr. Aundra Dubin   History of Present Illness: [name] [name] is a 70 y.o. female who has a history of CAD s/p CABG in 12/05 and redo in 1/11, bioprosthetic AVR, diastolic CHF, and sick sinus syndrome s/p PCM presents for cardiology followup.  She had initial CABG with SVG-RCA in 12/05 followed by redo SVG-RCA and bioprosthetic AVR in 1/11.  She has had diastolic CHF and is on Lasix.  In 9/14, she had a CPX test.  This showed severe functional limitation with ischemic ECG changes (inferolateral ST depression and ST elevation in V1 and V2). She had chest pain.  She had R/LHC in 9/14.  This showed elevated left and right heart filling pressures, patent SVG-PDA, and 80% distal LM/80% ostial LAD stenosis.  She was not a candidate for redo CABG (had 2 prior sternotomies as well as chest radiation for Hodgkins lymphoma).  She had DES from left main into proximal LAD and will need long-term DAPT => Brilinta was used as she has been a poor responder to Plavix in the past.  She re-developed angina in 11/15 and was admitted with unstable angina.  LHC showed 80% ostial LM and patent SVG-RCA. She had DES to ostial LM. Echo in 1/18 showed EF 60-65%, normal bioprosthetic aortic valve, mild-moderate MR, PASP 35 mmHg.    She has chronic iron deficiency anemia thought to be due to a slow GI bleed.  Last EGD was unremarkable and a capsule endoscopy was also unremarkable.  She has had periodic transfusions.     She had bilateral mastectomy for DCIS in 62/26 without complication.   She has an insulin pump followed by an endocrinologist at [facility].    Given myalgias with statins, she was started on Praluent.  LDL is now excellent.     In the past she has had short runs of atrial fibrillation.  She  is not anticoagulated. Planned for Watchman placement, but given her nickel allergy, we were unable to place a Watchman.      Diagnosed with OSA, now on CPAP.    Given ongoing problems with exertional dyspnea and occasional chest pain, Cardiolite was repeated in 9/18 and showed inferior changes.  She underwent left/right heart cath in 9/18.  This showed elevated filling pressures, R>L.  There was 75+% ostial LCx stenosis.  However, there were 2 layers of stent from the mid left main into the ostial LAD, and LCx intervention was thought to be very risky.  It was decided to manage her medically.  Echo was repeated in 10/18 showing EF 55-60%, moderate to severe TR.     Given recurrent chest pain, she had ETT-Cardiolite done in 3/19.  This was a normal study with no evidence for ischemia or infarction.  Echo in 1/20 showed EF 55-60%, mild LVH, stable bioprosthetic aortic valve, moderate-severe TR.    Due to increased dyspnea and worsening renal function, she was set up for echo and RHC.  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  [place] in 8/20 showed primarily right-sided HF.  Cardiolite in 8/20  showed no ischemia or infarction.  Torsemide was increased and she cut back on sodium in her diet.    CPX in 11/20 showed severe functional limitation from HF.   She saw Dr. Mosetta Pigeon at [facility] for evaluation for heart/kidney transplant (for restrictive cardiomyopathy).  She was determined to be a poor surgical candidate due to extensive vascular calcifications.   CT chest in 8/21 showed a loculated right pleural effusion but unable to find enough fluid by ultrasound for thoracentesis.   Echo in 9/21 showed EF 60-65% with mild LVH, mild RV dilation with mildly decreased systolic function  and a D-shaped septum, PASP 40, normal bioprosthetic aortic valve, rheumatic-appearing MV with mild-moderate mitral stenosis (mean gradient 9 but MVA by PHT 2.7 cm^2), trivial MR.  Echo was done today and reviewed, EF 60-65%, mild LVH, normal RV systolic function but with D-shaped septum, normal bioprosthetic aortic valve, mild mitral stenosis (rheumatic-appearing valve, mean gradient 4 mmHg), mild MR.    She returns today for followup of CHF and CAD.  She is short of breath walking up hills, generally ok walking on flat ground.  No chest pain.  She was able to [place] [facility] last weekend. Overall, she thinks that her breathing has improved.  Weight stable. No palpitations.    ECG (personally reviewed): NSR, long first degree AVB, LVH   Labs (2/19): hgb 13.2 Labs @ [facility] (3/19): K 4.7, creatinine 1.4, ANA negative, RF negative Labs (4/19): K 3.9, creatinine 1.52 => 1.63 Labs (6/19): LDL 8, HDL 42, TGs 259 Labs (9/19): K 4.9, creatinine 1.65, hgb 13.4, plts 129, LDL 46,TGs 182 Labs (12/19): K 4.9, creatinine 2.07, hgb 12.4 Labs (3/20): K 4, creatinine 1.8 Labs (6/20): K 4.7, creatinine 1.47 Labs (8/20): creatinine 2.13 => 2.46 => 2.4, LDL 22 Labs (9/20): K 4.8, creatinine 2.74, AST/ALT normal, hgb 12.6 Labs (1/21): K 4.1, creatinine 1.86, hgb 11.7 Labs (4/21): K 4.7, creatinine 1.7 Labs (6/21): K 4.2, creatinine 2 => 1.7 Labs (8/21): K 3.8, creatinine 1.69, hgb 10.6, plts 116 Labs (9/21): LDL 34, TGs 209 Labs (1/22): K 3.8, creatinine 2.0 Labs (3/22): LDL 25 Labs (4/22): K 4.7, creatinine 2.0, hgb 12.3 Labs (5/22): K 4.4, creatinine 2.21 Labs (8/22): LDL 1, TGs 330 Labs (10/22): K 4.6, creatinine 1.87 Labs (11/22): LDL 26 Labs (12/22): K 4.1, creatinine 2.25 Labs (1/23): creatinine 2.65 Labs (3/23): K 4.6, creatinine 2.8 Labs (4/23): K 4.1, creatinine 2.16, BNP 94   PMH: 1. Sick sinus syndrome s/p [employer] PCM.  2. HTN 3. H/o SBO 4. Renal cell carcinoma s/p right nephrectomy  in 2008.  5. 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  Fibroscan in 11/14 showed advanced fibrosis concerning for cirrhosis. EGD in 11/14 showed no varices.  6. Diastolic CHF: Echo (5/57) with EF 60-65%, bioprosthetic aortic valve with mean gradient 15 mmHg, mild MR.  CPX (9/14): peak VO2 10.4, VE/VCO2 43.8, inferolateral ST depression and ST elevation in V1/V2 with severe dyspnea and chest pain, severe functional limitation with ischemic changes.  Echo (9/14) with EF 60-65%, normal RV size and systolic function, mild-moderate MR, bioprosthetic aortic valve normal, PA systolic pressure 51 mmHg, no evidence for pericardial constriction though exam incomplete for constriction.  Echo (11/15) with EF 55-60%, bioprosthetic aortic valve, mild-moderate MR, moderate TR, PA systolic pressure 42 mmHg.  - TEE (5/17) with EF 60-65%, normal bioprosthetic aortic valve with mean gradient 13 mmHg, normal RV size and systolic function. - Echo (1/18) with EF 60-65%, normal bioprosthetic aortic valve,  mild-moderate MR, PASP 35 mmHg.  - Echo (10/18) with EF 55-60%, bioprosthetic aortic valve mean gradient 10 mmHg, MAC with mild mitral stenosis (mean gradient 7) and mild mitral regurgitation, moderate to severe TR, PASP 43 mmHg.  - RHC (9/18): mean RA 13, PA 44/18 mean 33, mean PCWP 19, CI 3.45, PVR 2.7 WU.  - Echo (1/20): EF 55-60%, mild LVH, mild MR, stable bioprosthetic aortic valve, moderate-severe TR.  - RHC (8/20): mean RA 14, PA 43/15 mean 28, mean PCWP 13, PAPI 2, PVR 4.2, CI 2.31 - Echo (8/20): EF 55-60%, RV mildly dilated with normal systolic function, mildly D-shaped septum suggestive of RV pressure/volume overload, rheumatic-appearing mitral valve with mild mitral stenosis (mean gradient 6, MVA by PHT 4 cm^2), no evidence for pericardial constriction, moderate TR, bioprosthetic aortic valve functioning normally with mean gradient 6 mmHg.  - CPX (11/20): peak VO2 9.8, VE/VCO2 slope 38, RER 1.11 => severe functional  limitation from HF.   - Echo (9/21): EF 60-65% with mild LVH, mild RV dilation with mildly decreased systolic function and a D-shaped septum, PASP 40, normal bioprosthetic aortic valve, rheumatic-appearing MV with mild-moderate mitral stenosis (mean gradient 9 but MVA by PHT 2.7 cm^2, trivial MR.  - Echo (2/23): EF 60-65%, mild LVH, normal RV systolic function but with D-shaped septum, normal bioprosthetic aortic valve, mild mitral stenosis (rheumatic-appearing valve, mean gradient 4 mmHg), mild MR.  7. TAH-BSO 8. Type I diabetes: Has insulin pump.  9. H/o ischemic colitis. 10. H/o PUD. 11. Diabetic gastroparesis. 12. Hyperlipidemia: Myalgias with Crestor and atorvastatin, elevated LFTs with simvastatin.  Myalgias with > 10 mg pravastatin. Now on Praluent.  13. CKD stage IV: Sees Dr. Lorrene Reid 14. Bicuspid aortic valve s/p 19 mm Edwards pericardial valve in 1/11 (had post-operative Dresslers syndrome).   15. CAD: CABG with SVG-RCA in 12/05.  Redo SVG-RCA in 1/11 with AVR.  LHC/RHC (9/14) with mean RA 18, PA 62/25 mean 43, mean PCWP 26, CI 2.8, 70-80% distal LM stenosis, 80% ostial LAD stenosis, total occlusion RCA, SVG-PDA patent.  Patient had DES LM into LAD.  Plan to continue long-term ASA/Brilinta (poor responder to Plavix).  ETT-cardiolite (4/15) with 6' exercise, EF 60%, ST depression in recovery and mild chest pain, no ischemia or infarction by perfusion images.  Unstable angina 11/15 with 80% LM, patent SVG-RCA => DES to ostial LM.   - Lexiscan Cardiolite (2/18): EF 62%, no ischemia or infarction (normal).  She had a significant reaction to [place] and probably should not get again.  - Cardiolite (9/18): EF 72%, prior inferior infarct with mild peri-infarct ischemia.  - LHC (9/18): 2 layers stent left main - ostial LAD with 50% dLM in-stent restenosis, 75+% ostial LCx, totally occluded RCA, SVG-RCA patent.  Medical management planned.  - ETT-Cardiolite (4/19): EF normal, 7'11" exercise, no  ischemia or infarction.  - Lexiscan Cardiolite (8/20): No ischemia/infarction.  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  17. Anemia: Iron-deficiency.  Suspect chronic GI blood loss.  EGD in 9/14 was unremarkable.  Capsule endoscopy 10/14 was unremarkable.  Has required periodic blood transfusions.  18. Atrial fibrillation: Paroxysmal, [facility] by [facility].  - Zio monitor 3/23: < 1% atrial fibrillation, longest run was 18 minutes.  19. Carotid stenosis: Carotid dopplers (2/15) with 40-59% bilateral stenosis. Carotid dopplers (3/16) with 40-59% bilateral ICA stenosis. Carotid dopplers (3/17) with 40-59% bilateral ICA stenosis.  - Carotid dopplers (3/18) with mild BICA stenosis.  20. Right leg pain: Suspected focal  dissection right CFA/EIA on 3/16 CTA, probably catheterization complication.  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  22. C difficile colitis 2017 23. Gout 24. ?TIA: head CT negative.  25. OSA: Moderate, uses CPAP.  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 Hypercalcemia 28. 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  Mild-moderate on 9/21 echo.  Mild on 2/23 echo.  29. Tricuspid regurgitation: Moderate by 8/20 echo.  30. Lung nodules: Followed at [facility] => stable.  31. ?Parkinsonism: She did not tolerate Sinemet.   Current Outpatient Medications  Medication Sig Dispense Refill   acyclovir ointment (ZOVIRAX) 5 % Apply 1 application topically every 3 (three) hours as needed (fever blister/cold sores).     ALPRAZolam (XANAX) 0.5 MG tablet Take 1 tablet (0.5 mg total) by mouth 2 (two) times daily as needed for anxiety. 60 tablet 1   APIDRA 100 UNIT/ML injection Inject 40 Units into the skin as directed. INFUSE THROUGH INSULIN PUMP UTD, Bolus depends on carb intake  2   aspirin EC 81 MG tablet Take 81 mg by mouth at bedtime.     BRILINTA 60 MG TABS tablet TAKE ONE TABLET (60MG TOTAL) BY MOUTH TWICE DAILY 180  tablet 3   cetirizine (ZYRTEC) 10 MG tablet Take 10 mg by mouth daily.     clobetasol (OLUX) 0.05 % topical foam Apply 1 application topically 2 (two) times daily as needed (skin flaking).     Coenzyme Q10 (CO Q 10) 100 MG CAPS Take 100 mg by mouth at bedtime.      colchicine 0.6 MG tablet Take 0.6 mg by mouth daily as needed (gout flare).     denosumab (PROLIA) 60 MG/ML SOSY injection Inject 60 mg into the skin every 6 (six) months.     diclofenac Sodium (VOLTAREN) 1 % GEL as needed.     docusate sodium (COLACE) 100 MG capsule Take 100 mg by mouth as needed for mild constipation.     escitalopram (LEXAPRO) 5 MG tablet Take 5 mg by mouth daily.     famotidine (PEPCID) 40 MG tablet Take 40 mg by mouth daily. Takes a second tablet at night if needed.     glucagon (GLUCAGON EMERGENCY) 1 MG injection Inject 1 mg into the vein once as needed (low blood sugar).     glucose blood (ACCU-CHEK AVIVA PLUS) test strip 1 each by Other route as needed for other. Use as instructed     isosorbide mononitrate (IMDUR) 120 MG 24 hr tablet TAKE ONE TABLET (120MG TOTAL) BY MOUTH DAILY 90 tablet 3   latanoprost (XALATAN) 0.005 % ophthalmic solution Place 1 drop into both eyes at bedtime.     metolazone (ZAROXOLYN) 2.5 MG tablet Take 2.5 mg (1 tab) once as directed by CHF clinic. Call before taking [phone] 3 tablet 0   metoprolol succinate (TOPROL XL) 25 MG 24 hr tablet Take 1 tablet (25 mg total) by mouth daily. 90 tablet 3   mometasone (NASONEX) 50 MCG/ACT nasal spray Place into the nose. As needed     mupirocin cream (BACTROBAN) 2 % Apply 1 application topically 2 (two) times daily as needed. 90 g 0   nitroGLYCERIN (NITROLINGUAL) 0.4 MG/SPRAY spray PLACE 1 SPRAY UNDER THE TONGUE EVERY 5 MINUTES FOR 3 DOSES AS NEEDED FOR CHEST PAIN. 14.7 g 0   polyethylene glycol (MIRALAX / GLYCOLAX) 17 g packet Take 17 g by mouth as needed.     pravastatin (PRAVACHOL) 10 MG tablet TAKE ONE TABLET (10 MG  TOTAL) BY MOUTH DAILY.  90 tablet 3   REPATHA SURECLICK 465 MG/ML SOAJ INJECT 140MG INTO THE SKIN EVERY 14 DAYS 6 mL 3   rOPINIRole (REQUIP) 0.25 MG tablet TAKE ONE TABLET (0.25MG TOTAL) BY MOUTH DAILY. TAKE TWO HOURS PRIOR TO BEDTIME EVERY NIGHT. 90 tablet 1   silver nitrate applicators 68-12 % applicator Use as needed to stop superficial skin bleeding. 10 each 0   spironolactone (ALDACTONE) 25 MG tablet Take 0.5 tablets (12.5 mg total) by mouth daily. 45 tablet 3   torsemide (DEMADEX) 20 MG tablet 8m daily alternating with 40 mg daily 270 tablet 3   ULORIC 40 MG tablet Take 1 tablet (40 mg total) by mouth at bedtime. 90 tablet 3   UNABLE TO FIND Med Name: So Clean CPAP cleaning system 1 each 0   Wheat Dextrin (BENEFIBER) POWD Take 1 Dose by mouth daily.      VASCEPA 1 g capsule TAKE TWO CAPSULES (2 GRAMS TOTAL) BY MOUTH TWO TIMES DAILY 360 capsule 3   No current facility-administered medications for this encounter.    Allergies:   Avandia [rosiglitazone maleate], Cephalexin, Clindamycin, Clindamycin hcl, Humalog [insulin lispro], Lincomycin, Metformin, Novolog [insulin aspart], Penicillins, Rosiglitazone, Tizanidine, Adhesive [tape], Atorvastatin, Cholestyramine, Gentamycin [gentamicin], Iodinated contrast media, Limonene, Prednisone, Ranexa [ranolazine], Rosuvastatin, Simvastatin, Strawberry extract, Azelastine, Clopidogrel, Lisinopril, Oseltamivir phosphate, Tizanidine hcl, Codeine, Erythromycin, Hydrocodone-acetaminophen, Latex, Neomycin, Nickel, Ranitidine, Tamiflu [oseltamivir], and Tramadol   Social History:  The patient  reports that she has never smoked. She has never used smokeless tobacco. She reports that she does not drink alcohol and does not use drugs.   Family History:  The patient's family history includes Anal fissures in her mother; Breast cancer (age of onset: 46 in her sister; Cancer (age of onset: 318 in her sister; Cancer (age of onset: 553 in her maternal grandfather; Diabetes in her father;  Glaucoma in her brother; Heart attack in her father, maternal grandmother, and mother; Heart disease in her father and mother; Hypertension in her brother; Lung cancer in her sister; Parkinson's disease in her paternal aunt.   ROS:  Please see the history of present illness.   All other systems are personally reviewed and negative.   Exam:   BP 118/60   Pulse 73   Wt 51.9 kg (114 lb 6.4 oz)   SpO2 98%   BMI 20.92 kg/m  General: NAD Neck: No JVD, no thyromegaly or thyroid nodule.  Lungs: Clear to auscultation bilaterally with normal respiratory effort. CV: Nondisplaced PMI.  Heart regular S1/S2, no S3/S4, 1/6 SEM RUSB.  No peripheral edema.  No carotid bruit.  Normal pedal pulses.  Abdomen: Soft, nontender, no hepatosplenomegaly, no distention.  Skin: Intact without lesions or rashes.  Neurologic: Alert and oriented x 3.  Psych: Normal affect. Extremities: No clubbing or cyanosis.  HEENT: Normal.   Recent Labs: 05/09/2021: ALT 8 08/31/2021: Hemoglobin 11.7; Platelets 125 02/20/2022: B Natriuretic Peptide 101.5; BUN 93; Creatinine, Ser 2.76; Potassium 4.6; Sodium 137  Personally reviewed   Wt Readings from Last 3 Encounters:  02/20/22 51.9 kg (114 lb 6.4 oz)  01/09/22 53.5 kg (118 lb)  11/16/21 52.5 kg (115 lb 12.8 oz)    ASSESSMENT AND PLAN:  1. CAD: s/p SVG-RCA in 12/05, then redo SVG-RCA in 1/11--> distal left main/proximal LAD severe stenosis in 9/14.  PCI with DES to distal LM/proximal LAD was done rather than bypass as she would have been a poor candidate for redo sternotomy (2 prior sternotomies and  history of chest wall radiation for Hodgkins disease).  Recurrent unstable angina with 80% ostial LM treated with repeat DES in 11/15.  Lexiscan Cardiolite in 9/18 inferior changes.  Repeat LHC (9/18) showed 50% distal left main in-stent restenosis and 75+% ostial LCx stenosis. I reviewed the films at the time with interventional cardiology => would be very difficult to intervene on  the ostial LCx through 2 overlapping stents from left main into the LAD.  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 Given no ACS, we planned medical management and increased Imdur.  Cardiolite in 3/19 and again in 8/20 showed no ischemia or infarction.  No exertional chest pain.  - Continue Brilinta 60 mg bid (poor responder to Plavix) long-term given recurrent left main disease.  - Continue ASA 81, pravastin/Repatha/Vascepa.  - Continue Imdur 120 mg daily . - Continue Toprol XL 25 mg daily.   - She was unable to tolerate ranolazine.  2. Bioprosthetic aortic valve replacement: In setting of bicuspid aortic valve, thoracic aorta not dilated on 2017 TEE.  Valve looked ok on 2/23 echo.   3. Chronic diastolic CHF: Restrictive cardiomyopathy.  Most recent echo in 2/23 shows normal LV systolic function, normal RV systolic function but D-shaped septum suggesting RV pressure/volume overload, mild MS, normal bioprosthetic AoV, no evidence for pericardial constriction. RHC in 8/20 was concerning for right > left heart failure, she did appear to have equalization of diastolic pressures concerning for restrictive cardiomyopathy, possibly related to prior radiation. CPX in 11/20 showed severe HF limitation.  Balance of renal failure and RV failure has been difficult.  Creatinine 2.1 most recently.  Volume status looks ok today.  NYHA class II, chronic.   - Volume looks good today, continue current torsemide unless creatinine up (then can decrease).  BMET/BNP today.  - Continue spironolactone 12.5 mg daily.  - I had her [facility] at [facility] for heart/kidney transplant for CKD and restrictive cardiomyopathy, but extensive vascular calcification makes her a poor surgical candidate.  - No Jardiance/Farxiga given use of insulin pump.   4. CKD:  Patient has a single kidney s/p right nephrectomy and suspected diabetic nephropathy.  CKD stage 4, slowly progressive.  Creatinine 2.1 last check. I  have been concerned for cardiorenal syndrome in the setting of RV > LV failure/restrictive cardiomyopathy.   - Not candidate for PD given prior abdominal surgery.  - If CKD progresses, she may be a candidate for home HD.  - BMET today.  5. Hyperlipidemia: She has not been able to tolerate any higher potency statin than pravastatin 10 mg daily.    - Check lipids today.  - Continue pravastatin.  - Continue Repatha.   - She is on Vascepa for elevated TGs.  6. Anemia: Chronic/slow GI bleed.  Unremarkable EGD and capsule endoscopy in fall 2014.  Unfortunately, needs to stay on Brilinta long-term (Plavix non-responder).   7. Atrial fibrillation: Paroxysmal, very brief episodes noted on PCM interrogation. Given GI bleeding and need to be on Brilinta, risks have been thought to outweigh benefits of anticoagulation. She is not a good Multaq candidate with CHF.  If she needs an antiarrhythmic, consider Tikosyn or Sotalol but renal function may make these meds difficult to use.  Zio monitor in 3/23 showed < 1% AF, longest run 18 minutes.  She occasionally feels palpitations that may be due to AF runs. NSR today.  - We were unable to place Watchman device due to nickel allergy.   -  Continue Toprol XL 25 mg daily.  8. Carotid bruit: Only mild stenosis on last dopplers in 3/18.  9. SSS: Pacemaker in place.  10. HTN: controlled. Continue current regimen.   11. OSA: Moderate, using Bipap.   12. ?TIA: Transient dysarthria in 3/18, could have been afib-related TIA.  No recurrent symptoms.  13. Thrombocytopenia: Chronic and mild. Follows with hematology.  14. 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  Mild on 2/23 echo.  She would not be a candidate for valve replacement.   Followup in 3 months.   Signed, [name], MD  02/20/2022  [facility] [address] Heart and [place] [address] [phone] (office) [phone] (fax)

## 2022-02-23 ENCOUNTER — Telehealth (HOSPITAL_COMMUNITY): Payer: Self-pay | Admitting: Surgery

## 2022-02-23 DIAGNOSIS — I5032 Chronic diastolic (congestive) heart failure: Secondary | ICD-10-CM

## 2022-02-23 MED ORDER — TORSEMIDE 20 MG PO TABS: ORAL_TABLET | ORAL | 6 refills | Status: DC

## 2022-02-23 NOTE — Telephone Encounter (Signed)
-----   Message from Jerl Mina, RN sent at 02/23/2022 10:52 AM EDT -----  ----- Message ----- From: Larey Dresser, MD Sent: 02/20/2022   9:46 PM EDT To: Jerl Mina, RN; [facility] Triage Pool  Creatinine higher.  Hold torsemide for a day, decrease to 40 mg daily.  BMET 10 days.

## 2022-02-23 NOTE — Telephone Encounter (Signed)
I called patient to review results and recommendations per provider.  She will return August 14th for repeat labwork.  I have updated medlist in [facility].

## 2022-02-26 ENCOUNTER — Other Ambulatory Visit (HOSPITAL_COMMUNITY): Payer: Self-pay | Admitting: Cardiology

## 2022-03-03 DIAGNOSIS — E10649 Type 1 diabetes mellitus with hypoglycemia without coma: Secondary | ICD-10-CM | POA: Diagnosis not present

## 2022-03-05 ENCOUNTER — Ambulatory Visit (HOSPITAL_COMMUNITY)
Admission: RE | Admit: 2022-03-05 | Discharge: 2022-03-05 | Disposition: A | Discharge status: Home / Self Care | Payer: Medicare PPO | Source: Ambulatory Visit | Attending: Internal Medicine | Admitting: Internal Medicine

## 2022-03-05 DIAGNOSIS — I5032 Chronic diastolic (congestive) heart failure: Secondary | ICD-10-CM | POA: Insufficient documentation

## 2022-03-05 LAB — BASIC METABOLIC PANEL
Anion gap: 8 (ref 5–15)
BUN: 66 mg/dL — ABNORMAL HIGH (ref 8–23)
CO2: 28 mmol/L (ref 22–32)
Calcium: 10.3 mg/dL (ref 8.9–10.3)
Chloride: 104 mmol/L (ref 98–111)
Creatinine, Ser: 2.55 mg/dL — ABNORMAL HIGH (ref 0.44–1.00)
GFR, Estimated: 20 mL/min — ABNORMAL LOW (ref >60)
Glucose, Bld: 158 mg/dL — ABNORMAL HIGH (ref 70–99)
Potassium: 4 mmol/L (ref 3.5–5.1)
Sodium: 140 mmol/L (ref 135–145)

## 2022-03-13 DIAGNOSIS — L299 Pruritus, unspecified: Secondary | ICD-10-CM | POA: Diagnosis not present

## 2022-03-13 DIAGNOSIS — R1310 Dysphagia, unspecified: Secondary | ICD-10-CM | POA: Diagnosis not present

## 2022-03-13 DIAGNOSIS — K7581 Nonalcoholic steatohepatitis (NASH): Secondary | ICD-10-CM | POA: Diagnosis not present

## 2022-03-13 DIAGNOSIS — D649 Anemia, unspecified: Secondary | ICD-10-CM | POA: Diagnosis not present

## 2022-03-13 DIAGNOSIS — K7460 Unspecified cirrhosis of liver: Secondary | ICD-10-CM | POA: Diagnosis not present

## 2022-03-15 ENCOUNTER — Encounter (HOSPITAL_COMMUNITY): Payer: Self-pay | Admitting: Cardiology

## 2022-03-28 DIAGNOSIS — R5383 Other fatigue: Secondary | ICD-10-CM | POA: Diagnosis not present

## 2022-03-28 DIAGNOSIS — D649 Anemia, unspecified: Secondary | ICD-10-CM | POA: Diagnosis not present

## 2022-03-28 DIAGNOSIS — N184 Chronic kidney disease, stage 4 (severe): Secondary | ICD-10-CM | POA: Diagnosis not present

## 2022-03-28 DIAGNOSIS — G4710 Hypersomnia, unspecified: Secondary | ICD-10-CM | POA: Diagnosis not present

## 2022-03-29 ENCOUNTER — Encounter (HOSPITAL_COMMUNITY): Payer: Self-pay | Admitting: Cardiology

## 2022-03-29 DIAGNOSIS — E1122 Type 2 diabetes mellitus with diabetic chronic kidney disease: Secondary | ICD-10-CM | POA: Diagnosis not present

## 2022-03-29 DIAGNOSIS — D649 Anemia, unspecified: Secondary | ICD-10-CM | POA: Diagnosis not present

## 2022-03-29 DIAGNOSIS — E785 Hyperlipidemia, unspecified: Secondary | ICD-10-CM | POA: Diagnosis not present

## 2022-03-29 DIAGNOSIS — N184 Chronic kidney disease, stage 4 (severe): Secondary | ICD-10-CM | POA: Diagnosis not present

## 2022-03-29 DIAGNOSIS — G4733 Obstructive sleep apnea (adult) (pediatric): Secondary | ICD-10-CM | POA: Diagnosis not present

## 2022-03-29 DIAGNOSIS — I4891 Unspecified atrial fibrillation: Secondary | ICD-10-CM | POA: Diagnosis not present

## 2022-03-30 ENCOUNTER — Telehealth (HOSPITAL_COMMUNITY): Payer: Self-pay | Admitting: Pharmacist

## 2022-03-30 ENCOUNTER — Other Ambulatory Visit (HOSPITAL_COMMUNITY): Payer: Self-pay

## 2022-03-30 ENCOUNTER — Telehealth: Payer: Self-pay | Admitting: Cardiology

## 2022-03-30 DIAGNOSIS — E10649 Type 1 diabetes mellitus with hypoglycemia without coma: Secondary | ICD-10-CM | POA: Diagnosis not present

## 2022-03-30 MED ORDER — KERENDIA 10 MG PO TABS: 10.0000 mg | ORAL_TABLET | Freq: Every day | ORAL | 11 refills | Status: DC

## 2022-03-30 NOTE — Telephone Encounter (Signed)
Prior authorization for Kerendia 10 mg was approved.

## 2022-03-30 NOTE — Telephone Encounter (Signed)
Advanced Heart Failure Patient Advocate Encounter  Prior Authorization for Carrington Clamp has been approved.    PA# [id number] Effective dates: 03/30/22 through 07/22/22  Patients co-pay is $40.00  Audry Riles, PharmD, BCPS, BCCP, CPP Heart Failure Clinic Pharmacist [phone]

## 2022-03-30 NOTE — Telephone Encounter (Signed)
[facility] Physician calling to request a call back for pt who would like to discuss further the sleep study and what her next steps should be. Please advise.

## 2022-04-02 ENCOUNTER — Other Ambulatory Visit (HOSPITAL_COMMUNITY): Payer: Self-pay | Admitting: Cardiology

## 2022-04-02 ENCOUNTER — Other Ambulatory Visit (HOSPITAL_COMMUNITY): Payer: Self-pay

## 2022-04-03 ENCOUNTER — Ambulatory Visit (INDEPENDENT_AMBULATORY_CARE_PROVIDER_SITE_OTHER): Payer: Medicare PPO

## 2022-04-03 DIAGNOSIS — I495 Sick sinus syndrome: Secondary | ICD-10-CM

## 2022-04-05 NOTE — Telephone Encounter (Signed)
Follow Up:    Danae Chen from Dr Roma Kayser office called again today. She said to please try and call this patient asap please. She says she have a lot of concerns and questions.

## 2022-04-06 LAB — CUP PACEART REMOTE DEVICE CHECK
Battery Impedance: 1047 Ohm
Battery Remaining Longevity: 68 mo
Battery Voltage: 2.79 V
Brady Statistic AP VP Percent: 0 %
Brady Statistic AP VS Percent: 0 %
Brady Statistic AS VP Percent: 0 %
Brady Statistic AS VS Percent: 99 %
Date Time Interrogation Session: 20230912163828
Implantable Lead Implant Date: 20140130
Implantable Lead Implant Date: 20140130
Implantable Lead Location: 753859
Implantable Lead Location: 753860
Implantable Lead Model: 5076
Implantable Lead Model: 5076
Implantable Pulse Generator Implant Date: 20140130
Lead Channel Impedance Value: 460 Ohm
Lead Channel Impedance Value: 600 Ohm
Lead Channel Pacing Threshold Amplitude: 0.5 V
Lead Channel Pacing Threshold Amplitude: 0.75 V
Lead Channel Pacing Threshold Pulse Width: 0.4 ms
Lead Channel Pacing Threshold Pulse Width: 0.4 ms
Lead Channel Setting Pacing Amplitude: 1.5 V
Lead Channel Setting Pacing Amplitude: 2.5 V
Lead Channel Setting Pacing Pulse Width: 0.4 ms
Lead Channel Setting Sensing Sensitivity: 5.6 mV

## 2022-04-09 ENCOUNTER — Encounter (HOSPITAL_BASED_OUTPATIENT_CLINIC_OR_DEPARTMENT_OTHER): Payer: Self-pay | Admitting: Cardiology

## 2022-04-10 ENCOUNTER — Telehealth (HOSPITAL_COMMUNITY): Payer: Self-pay

## 2022-04-10 ENCOUNTER — Other Ambulatory Visit (HOSPITAL_COMMUNITY): Payer: Medicare PPO

## 2022-04-10 ENCOUNTER — Ambulatory Visit (HOSPITAL_COMMUNITY)
Admission: RE | Admit: 2022-04-10 | Discharge: 2022-04-10 | Disposition: A | Discharge status: Home / Self Care | Payer: Medicare PPO | Source: Ambulatory Visit | Attending: Cardiology | Admitting: Cardiology

## 2022-04-10 DIAGNOSIS — I5032 Chronic diastolic (congestive) heart failure: Secondary | ICD-10-CM | POA: Diagnosis not present

## 2022-04-10 LAB — BASIC METABOLIC PANEL
Anion gap: 10 (ref 5–15)
BUN: 100 mg/dL — ABNORMAL HIGH (ref 8–23)
CO2: 28 mmol/L (ref 22–32)
Calcium: 11.1 mg/dL — ABNORMAL HIGH (ref 8.9–10.3)
Chloride: 102 mmol/L (ref 98–111)
Creatinine, Ser: 2.78 mg/dL — ABNORMAL HIGH (ref 0.44–1.00)
GFR, Estimated: 18 mL/min — ABNORMAL LOW (ref >60)
Glucose, Bld: 287 mg/dL — ABNORMAL HIGH (ref 70–99)
Potassium: 5 mmol/L (ref 3.5–5.1)
Sodium: 140 mmol/L (ref 135–145)

## 2022-04-10 NOTE — Telephone Encounter (Signed)
Patient advised and verbalized understanding. lab orders entered, lab appt scheduled.   Orders Placed This Encounter  Procedures   Basic metabolic panel    Standing Status:   Future    Standing Expiration Date:   04/11/2023    Order Specific Question:   Release to patient    Answer:   Immediate

## 2022-04-10 NOTE — Telephone Encounter (Signed)
-----   Message from Larey Dresser, MD sent at 04/10/2022  4:18 PM EDT ----- BUN/creatinine higher.  K upper normal.  Low K diet, no K supplement.  Hold torsemide for 2 days then decrease to 40 mg daily after that.  BMET 1 week.

## 2022-04-11 ENCOUNTER — Telehealth: Payer: Self-pay | Admitting: Cardiology

## 2022-04-11 NOTE — Telephone Encounter (Signed)
Patient contacted by [employer].

## 2022-04-11 NOTE — Addendum Note (Signed)
Addended by: Freada Bergeron on: 04/11/2022 10:27 AM   Modules accepted: Orders

## 2022-04-11 NOTE — Telephone Encounter (Signed)
Dr. Mannie Stabile wanted to make Dr. Radford Pax aware that this patient is having multiple apneic episodes at night and patient is not sleeping properly.  Caller stated this patient has CHF and would like someone to reach out to this patient to discuss next steps.

## 2022-04-12 ENCOUNTER — Telehealth (HOSPITAL_COMMUNITY): Payer: Self-pay

## 2022-04-12 MED ORDER — TORSEMIDE 20 MG PO TABS: ORAL_TABLET | ORAL | 6 refills | Status: DC

## 2022-04-12 NOTE — Telephone Encounter (Signed)
Reached out to patient authorization approved and patient is scheduled.

## 2022-04-12 NOTE — Telephone Encounter (Signed)
-----   Message from Larey Dresser, MD sent at 04/10/2022  6:46 PM EDT ----- Decrease to torsemide 40 daily alternating with 20 daily after holding 2 days.  ----- Message ----- From: Shonna Chock, CMA Sent: [phone]/[id number]   4:30 PM EDT To: Larey Dresser, MD  Patient advised and verbalized understanding. Med list updated to reflect changes, lab orders entered, lab appt scheduled. Patient reports that she currently takes torsemide 53m qd already. Please advise

## 2022-04-12 NOTE — Telephone Encounter (Signed)
TURNER READ--APPROVED:AUTHORIZATION NUMBER [id number]--07/22/23.

## 2022-04-12 NOTE — Telephone Encounter (Signed)
Patient advised and verbalized understanding. Med list updated to reflect changes.  Meds ordered this encounter  Medications   torsemide (DEMADEX) 20 MG tablet    Sig: Take 2 tablets by mouth daily alternating with 76m daily.    Dispense:  30 tablet    Refill:  6    Please cancel all previous orders for current medication. Change in dosage or pill size.

## 2022-04-16 ENCOUNTER — Encounter (HOSPITAL_COMMUNITY): Payer: Self-pay | Admitting: Cardiology

## 2022-04-16 DIAGNOSIS — K3184 Gastroparesis: Secondary | ICD-10-CM | POA: Diagnosis not present

## 2022-04-16 DIAGNOSIS — M810 Age-related osteoporosis without current pathological fracture: Secondary | ICD-10-CM | POA: Diagnosis not present

## 2022-04-16 DIAGNOSIS — E1021 Type 1 diabetes mellitus with diabetic nephropathy: Secondary | ICD-10-CM | POA: Diagnosis not present

## 2022-04-16 DIAGNOSIS — N184 Chronic kidney disease, stage 4 (severe): Secondary | ICD-10-CM | POA: Diagnosis not present

## 2022-04-16 DIAGNOSIS — Z9641 Presence of insulin pump (external) (internal): Secondary | ICD-10-CM | POA: Diagnosis not present

## 2022-04-16 DIAGNOSIS — I130 Hypertensive heart and chronic kidney disease with heart failure and stage 1 through stage 4 chronic kidney disease, or unspecified chronic kidney disease: Secondary | ICD-10-CM | POA: Diagnosis not present

## 2022-04-16 DIAGNOSIS — E1022 Type 1 diabetes mellitus with diabetic chronic kidney disease: Secondary | ICD-10-CM | POA: Diagnosis not present

## 2022-04-16 DIAGNOSIS — E785 Hyperlipidemia, unspecified: Secondary | ICD-10-CM | POA: Diagnosis not present

## 2022-04-16 DIAGNOSIS — I2510 Atherosclerotic heart disease of native coronary artery without angina pectoris: Secondary | ICD-10-CM | POA: Diagnosis not present

## 2022-04-16 DIAGNOSIS — E1043 Type 1 diabetes mellitus with diabetic autonomic (poly)neuropathy: Secondary | ICD-10-CM | POA: Diagnosis not present

## 2022-04-16 DIAGNOSIS — I509 Heart failure, unspecified: Secondary | ICD-10-CM | POA: Diagnosis not present

## 2022-04-17 ENCOUNTER — Other Ambulatory Visit: Payer: Self-pay

## 2022-04-17 ENCOUNTER — Inpatient Hospital Stay (HOSPITAL_COMMUNITY)
Admission: EM | Admit: 2022-04-17 | Discharge: 2022-04-24 | DRG: 391 | Disposition: A | Discharge status: Home / Self Care | Payer: Medicare PPO | Attending: Internal Medicine | Admitting: Internal Medicine

## 2022-04-17 ENCOUNTER — Encounter (HOSPITAL_COMMUNITY): Payer: Self-pay | Admitting: *Deleted

## 2022-04-17 ENCOUNTER — Emergency Department (HOSPITAL_COMMUNITY): Payer: Medicare PPO

## 2022-04-17 DIAGNOSIS — Z950 Presence of cardiac pacemaker: Secondary | ICD-10-CM | POA: Diagnosis present

## 2022-04-17 DIAGNOSIS — K5900 Constipation, unspecified: Principal | ICD-10-CM

## 2022-04-17 DIAGNOSIS — K7581 Nonalcoholic steatohepatitis (NASH): Secondary | ICD-10-CM | POA: Diagnosis present

## 2022-04-17 DIAGNOSIS — E1043 Type 1 diabetes mellitus with diabetic autonomic (poly)neuropathy: Secondary | ICD-10-CM | POA: Diagnosis present

## 2022-04-17 DIAGNOSIS — E43 Unspecified severe protein-calorie malnutrition: Secondary | ICD-10-CM | POA: Insufficient documentation

## 2022-04-17 DIAGNOSIS — Z9641 Presence of insulin pump (external) (internal): Secondary | ICD-10-CM | POA: Diagnosis present

## 2022-04-17 DIAGNOSIS — R109 Unspecified abdominal pain: Secondary | ICD-10-CM | POA: Diagnosis not present

## 2022-04-17 DIAGNOSIS — Z90710 Acquired absence of both cervix and uterus: Secondary | ICD-10-CM

## 2022-04-17 DIAGNOSIS — K921 Melena: Secondary | ICD-10-CM | POA: Diagnosis not present

## 2022-04-17 DIAGNOSIS — F419 Anxiety disorder, unspecified: Secondary | ICD-10-CM | POA: Diagnosis present

## 2022-04-17 DIAGNOSIS — E1022 Type 1 diabetes mellitus with diabetic chronic kidney disease: Secondary | ICD-10-CM | POA: Diagnosis present

## 2022-04-17 DIAGNOSIS — N189 Chronic kidney disease, unspecified: Secondary | ICD-10-CM

## 2022-04-17 DIAGNOSIS — Z8571 Personal history of Hodgkin lymphoma: Secondary | ICD-10-CM

## 2022-04-17 DIAGNOSIS — K567 Ileus, unspecified: Secondary | ICD-10-CM | POA: Diagnosis not present

## 2022-04-17 DIAGNOSIS — R7401 Elevation of levels of liver transaminase levels: Secondary | ICD-10-CM | POA: Diagnosis present

## 2022-04-17 DIAGNOSIS — R748 Abnormal levels of other serum enzymes: Secondary | ICD-10-CM

## 2022-04-17 DIAGNOSIS — F39 Unspecified mood [affective] disorder: Secondary | ICD-10-CM | POA: Diagnosis present

## 2022-04-17 DIAGNOSIS — K3184 Gastroparesis: Secondary | ICD-10-CM | POA: Diagnosis present

## 2022-04-17 DIAGNOSIS — Z91040 Latex allergy status: Secondary | ICD-10-CM

## 2022-04-17 DIAGNOSIS — I25119 Atherosclerotic heart disease of native coronary artery with unspecified angina pectoris: Secondary | ICD-10-CM | POA: Diagnosis present

## 2022-04-17 DIAGNOSIS — I130 Hypertensive heart and chronic kidney disease with heart failure and stage 1 through stage 4 chronic kidney disease, or unspecified chronic kidney disease: Secondary | ICD-10-CM | POA: Diagnosis present

## 2022-04-17 DIAGNOSIS — R1084 Generalized abdominal pain: Secondary | ICD-10-CM | POA: Diagnosis not present

## 2022-04-17 DIAGNOSIS — E876 Hypokalemia: Secondary | ICD-10-CM | POA: Diagnosis present

## 2022-04-17 DIAGNOSIS — E860 Dehydration: Secondary | ICD-10-CM | POA: Diagnosis present

## 2022-04-17 DIAGNOSIS — R935 Abnormal findings on diagnostic imaging of other abdominal regions, including retroperitoneum: Secondary | ICD-10-CM | POA: Diagnosis not present

## 2022-04-17 DIAGNOSIS — D631 Anemia in chronic kidney disease: Secondary | ICD-10-CM | POA: Diagnosis present

## 2022-04-17 DIAGNOSIS — Z91041 Radiographic dye allergy status: Secondary | ICD-10-CM

## 2022-04-17 DIAGNOSIS — I5032 Chronic diastolic (congestive) heart failure: Secondary | ICD-10-CM | POA: Diagnosis not present

## 2022-04-17 DIAGNOSIS — R1031 Right lower quadrant pain: Secondary | ICD-10-CM

## 2022-04-17 DIAGNOSIS — Z853 Personal history of malignant neoplasm of breast: Secondary | ICD-10-CM

## 2022-04-17 DIAGNOSIS — E10649 Type 1 diabetes mellitus with hypoglycemia without coma: Secondary | ICD-10-CM | POA: Diagnosis not present

## 2022-04-17 DIAGNOSIS — Z20822 Contact with and (suspected) exposure to covid-19: Secondary | ICD-10-CM | POA: Diagnosis present

## 2022-04-17 DIAGNOSIS — Z7902 Long term (current) use of antithrombotics/antiplatelets: Secondary | ICD-10-CM

## 2022-04-17 DIAGNOSIS — K8020 Calculus of gallbladder without cholecystitis without obstruction: Secondary | ICD-10-CM | POA: Diagnosis not present

## 2022-04-17 DIAGNOSIS — M810 Age-related osteoporosis without current pathological fracture: Secondary | ICD-10-CM | POA: Diagnosis present

## 2022-04-17 DIAGNOSIS — Z923 Personal history of irradiation: Secondary | ICD-10-CM

## 2022-04-17 DIAGNOSIS — I495 Sick sinus syndrome: Secondary | ICD-10-CM | POA: Diagnosis present

## 2022-04-17 DIAGNOSIS — Z833 Family history of diabetes mellitus: Secondary | ICD-10-CM

## 2022-04-17 DIAGNOSIS — G4733 Obstructive sleep apnea (adult) (pediatric): Secondary | ICD-10-CM | POA: Diagnosis present

## 2022-04-17 DIAGNOSIS — R933 Abnormal findings on diagnostic imaging of other parts of digestive tract: Secondary | ICD-10-CM | POA: Diagnosis not present

## 2022-04-17 DIAGNOSIS — I880 Nonspecific mesenteric lymphadenitis: Secondary | ICD-10-CM | POA: Diagnosis not present

## 2022-04-17 DIAGNOSIS — Z90722 Acquired absence of ovaries, bilateral: Secondary | ICD-10-CM

## 2022-04-17 DIAGNOSIS — Z794 Long term (current) use of insulin: Secondary | ICD-10-CM | POA: Diagnosis not present

## 2022-04-17 DIAGNOSIS — K439 Ventral hernia without obstruction or gangrene: Secondary | ICD-10-CM | POA: Diagnosis present

## 2022-04-17 DIAGNOSIS — Z951 Presence of aortocoronary bypass graft: Secondary | ICD-10-CM

## 2022-04-17 DIAGNOSIS — R5382 Chronic fatigue, unspecified: Secondary | ICD-10-CM | POA: Diagnosis present

## 2022-04-17 DIAGNOSIS — E109 Type 1 diabetes mellitus without complications: Secondary | ICD-10-CM | POA: Diagnosis present

## 2022-04-17 DIAGNOSIS — H409 Unspecified glaucoma: Secondary | ICD-10-CM | POA: Diagnosis present

## 2022-04-17 DIAGNOSIS — Z9049 Acquired absence of other specified parts of digestive tract: Secondary | ICD-10-CM

## 2022-04-17 DIAGNOSIS — Z880 Allergy status to penicillin: Secondary | ICD-10-CM

## 2022-04-17 DIAGNOSIS — I480 Paroxysmal atrial fibrillation: Secondary | ICD-10-CM | POA: Diagnosis present

## 2022-04-17 DIAGNOSIS — I10 Essential (primary) hypertension: Secondary | ICD-10-CM | POA: Diagnosis present

## 2022-04-17 DIAGNOSIS — Z888 Allergy status to other drugs, medicaments and biological substances status: Secondary | ICD-10-CM

## 2022-04-17 DIAGNOSIS — I129 Hypertensive chronic kidney disease with stage 1 through stage 4 chronic kidney disease, or unspecified chronic kidney disease: Secondary | ICD-10-CM | POA: Diagnosis not present

## 2022-04-17 DIAGNOSIS — Z889 Allergy status to unspecified drugs, medicaments and biological substances status: Secondary | ICD-10-CM

## 2022-04-17 DIAGNOSIS — Z6820 Body mass index (BMI) 20.0-20.9, adult: Secondary | ICD-10-CM

## 2022-04-17 DIAGNOSIS — Z905 Acquired absence of kidney: Secondary | ICD-10-CM

## 2022-04-17 DIAGNOSIS — N184 Chronic kidney disease, stage 4 (severe): Secondary | ICD-10-CM | POA: Diagnosis present

## 2022-04-17 DIAGNOSIS — N200 Calculus of kidney: Secondary | ICD-10-CM | POA: Diagnosis not present

## 2022-04-17 DIAGNOSIS — Z9012 Acquired absence of left breast and nipple: Secondary | ICD-10-CM

## 2022-04-17 DIAGNOSIS — Z8249 Family history of ischemic heart disease and other diseases of the circulatory system: Secondary | ICD-10-CM

## 2022-04-17 DIAGNOSIS — Z9079 Acquired absence of other genital organ(s): Secondary | ICD-10-CM

## 2022-04-17 DIAGNOSIS — Z803 Family history of malignant neoplasm of breast: Secondary | ICD-10-CM

## 2022-04-17 DIAGNOSIS — R7989 Other specified abnormal findings of blood chemistry: Secondary | ICD-10-CM | POA: Diagnosis not present

## 2022-04-17 DIAGNOSIS — R1011 Right upper quadrant pain: Secondary | ICD-10-CM | POA: Diagnosis not present

## 2022-04-17 DIAGNOSIS — Z83511 Family history of glaucoma: Secondary | ICD-10-CM

## 2022-04-17 DIAGNOSIS — I2510 Atherosclerotic heart disease of native coronary artery without angina pectoris: Secondary | ICD-10-CM | POA: Diagnosis present

## 2022-04-17 DIAGNOSIS — Z881 Allergy status to other antibiotic agents status: Secondary | ICD-10-CM

## 2022-04-17 DIAGNOSIS — Z91048 Other nonmedicinal substance allergy status: Secondary | ICD-10-CM

## 2022-04-17 DIAGNOSIS — E785 Hyperlipidemia, unspecified: Secondary | ICD-10-CM | POA: Diagnosis present

## 2022-04-17 DIAGNOSIS — M25551 Pain in right hip: Secondary | ICD-10-CM | POA: Diagnosis not present

## 2022-04-17 DIAGNOSIS — Z85528 Personal history of other malignant neoplasm of kidney: Secondary | ICD-10-CM

## 2022-04-17 DIAGNOSIS — Z79899 Other long term (current) drug therapy: Secondary | ICD-10-CM

## 2022-04-17 DIAGNOSIS — Z7982 Long term (current) use of aspirin: Secondary | ICD-10-CM

## 2022-04-17 DIAGNOSIS — M1990 Unspecified osteoarthritis, unspecified site: Secondary | ICD-10-CM | POA: Diagnosis present

## 2022-04-17 DIAGNOSIS — Z9102 Food additives allergy status: Secondary | ICD-10-CM

## 2022-04-17 DIAGNOSIS — Z885 Allergy status to narcotic agent status: Secondary | ICD-10-CM

## 2022-04-17 LAB — URINALYSIS, ROUTINE W REFLEX MICROSCOPIC
Bilirubin Urine: NEGATIVE
Glucose, UA: NEGATIVE mg/dL
Hgb urine dipstick: NEGATIVE
Ketones, ur: NEGATIVE mg/dL
Leukocytes,Ua: NEGATIVE
Nitrite: NEGATIVE
Protein, ur: NEGATIVE mg/dL
Specific Gravity, Urine: <1.005 — ABNORMAL LOW (ref 1.005–1.030)
pH: 6.5 (ref 5.0–8.0)

## 2022-04-17 LAB — CBC
HCT: 34.3 % — ABNORMAL LOW (ref 36.0–46.0)
Hemoglobin: 11.1 g/dL — ABNORMAL LOW (ref 12.0–15.0)
MCH: 29.4 pg (ref 26.0–34.0)
MCHC: 32.4 g/dL (ref 30.0–36.0)
MCV: 91 fL (ref 80.0–100.0)
Platelets: 127 10*3/uL — ABNORMAL LOW (ref 150–400)
RBC: 3.77 MIL/uL — ABNORMAL LOW (ref 3.87–5.11)
RDW: 13.2 % (ref 11.5–15.5)
WBC: 9.8 10*3/uL (ref 4.0–10.5)
nRBC: 0 % (ref 0.0–0.2)

## 2022-04-17 LAB — COMPREHENSIVE METABOLIC PANEL
ALT: 60 U/L — ABNORMAL HIGH (ref 0–44)
AST: 49 U/L — ABNORMAL HIGH (ref 15–41)
Albumin: 3.8 g/dL (ref 3.5–5.0)
Alkaline Phosphatase: 231 U/L — ABNORMAL HIGH (ref 38–126)
Anion gap: 11 (ref 5–15)
BUN: 71 mg/dL — ABNORMAL HIGH (ref 8–23)
CO2: 26 mmol/L (ref 22–32)
Calcium: 9.9 mg/dL (ref 8.9–10.3)
Chloride: 99 mmol/L (ref 98–111)
Creatinine, Ser: 2.39 mg/dL — ABNORMAL HIGH (ref 0.44–1.00)
GFR, Estimated: 21 mL/min — ABNORMAL LOW (ref >60)
Glucose, Bld: 80 mg/dL (ref 70–99)
Potassium: 4.3 mmol/L (ref 3.5–5.1)
Sodium: 136 mmol/L (ref 135–145)
Total Bilirubin: 1.2 mg/dL (ref 0.3–1.2)
Total Protein: 8.3 g/dL — ABNORMAL HIGH (ref 6.5–8.1)

## 2022-04-17 LAB — LIPASE, BLOOD: Lipase: 47 U/L (ref 11–51)

## 2022-04-17 NOTE — ED Triage Notes (Addendum)
Pt was sent here from walk in clinic.  Pt reports generalized abdominal pain with some nausea and no BM x 4 days.  No pain or burning with urination.  No fever. Pt reports that she has felt like her abdomen was distended like a basketball is in in and unsteady gait.  Pt reports that she began a new medication 10 days ago (kerendia 9m)

## 2022-04-17 NOTE — ED Provider Triage Note (Signed)
Emergency Medicine Provider Triage Evaluation Note  [name] [name] , a 71 y.o. female  was evaluated in triage.  Pt complains of abdominal pain.  Patient is having abdominal pain for the past several days.  Patient ports pain is generalized.  Reports nausea without vomiting.  Patient reports no bowel movement and occasional flatulence.  Patient reports history of small bowel resection secondary to lipoma.  Known history of gallstones.  Has had a nephrectomy secondary to cancer.  Patient has known CKD and type 1 diabetes..  Review of Systems  Positive: Abdominal pain, nausea, constipation Negative: Fever, chills, urinary symptoms, bloody stools  Physical Exam  BP (!) 129/50 (BP Location: Right Arm)   Pulse 79   Temp 98.2 F (36.8 C) (Oral)   Resp 16   Wt 50.8 kg   SpO2 100%   BMI 20.49 kg/m  Gen:   Awake, no distress   Resp:  Normal effort  MSK:   Moves extremities without difficulty  Other:  Patient with diffuse abdominal pain and distention.  Increased bowel sounds.  Heart regular rate and rhythm.  Lungs clear to auscultation bilaterally.  Medical Decision Making  Medically screening exam initiated at 4:35 PM.  Appropriate orders placed.  SHAKA ZECH was informed that the remainder of the evaluation will be completed by another provider, this initial triage assessment does not replace that evaluation, and the importance of remaining in the ED until their evaluation is complete.  Patient with several days of abdominal pain.  History documented above.  Patient vital signs reassuring.  Patient is afebrile.  CT imaging is pending at this time.  Lab work is pending as well.  Differential diagnosis includes small bowel obstruction, cholecystitis, pancreatitis, colitis, appendicitis.   Doristine Devoid, PA-C 04/17/22 1638

## 2022-04-18 ENCOUNTER — Emergency Department (HOSPITAL_COMMUNITY): Payer: Medicare PPO

## 2022-04-18 ENCOUNTER — Encounter (HOSPITAL_COMMUNITY): Payer: Self-pay | Admitting: Cardiology

## 2022-04-18 ENCOUNTER — Other Ambulatory Visit (HOSPITAL_COMMUNITY): Payer: Medicare PPO

## 2022-04-18 ENCOUNTER — Encounter (HOSPITAL_COMMUNITY): Payer: Medicare PPO

## 2022-04-18 ENCOUNTER — Encounter (HOSPITAL_COMMUNITY): Payer: Self-pay

## 2022-04-18 ENCOUNTER — Encounter (HOSPITAL_COMMUNITY): Payer: Self-pay | Admitting: Internal Medicine

## 2022-04-18 DIAGNOSIS — E860 Dehydration: Secondary | ICD-10-CM | POA: Diagnosis present

## 2022-04-18 DIAGNOSIS — M810 Age-related osteoporosis without current pathological fracture: Secondary | ICD-10-CM | POA: Diagnosis present

## 2022-04-18 DIAGNOSIS — I25119 Atherosclerotic heart disease of native coronary artery with unspecified angina pectoris: Secondary | ICD-10-CM | POA: Diagnosis present

## 2022-04-18 DIAGNOSIS — N184 Chronic kidney disease, stage 4 (severe): Secondary | ICD-10-CM | POA: Diagnosis present

## 2022-04-18 DIAGNOSIS — Z20822 Contact with and (suspected) exposure to covid-19: Secondary | ICD-10-CM | POA: Diagnosis present

## 2022-04-18 DIAGNOSIS — F419 Anxiety disorder, unspecified: Secondary | ICD-10-CM | POA: Diagnosis present

## 2022-04-18 DIAGNOSIS — E785 Hyperlipidemia, unspecified: Secondary | ICD-10-CM | POA: Diagnosis present

## 2022-04-18 DIAGNOSIS — E1043 Type 1 diabetes mellitus with diabetic autonomic (poly)neuropathy: Secondary | ICD-10-CM | POA: Diagnosis present

## 2022-04-18 DIAGNOSIS — R933 Abnormal findings on diagnostic imaging of other parts of digestive tract: Secondary | ICD-10-CM | POA: Diagnosis not present

## 2022-04-18 DIAGNOSIS — K7581 Nonalcoholic steatohepatitis (NASH): Secondary | ICD-10-CM | POA: Diagnosis present

## 2022-04-18 DIAGNOSIS — K5900 Constipation, unspecified: Secondary | ICD-10-CM | POA: Diagnosis present

## 2022-04-18 DIAGNOSIS — I495 Sick sinus syndrome: Secondary | ICD-10-CM | POA: Diagnosis present

## 2022-04-18 DIAGNOSIS — R748 Abnormal levels of other serum enzymes: Secondary | ICD-10-CM | POA: Diagnosis not present

## 2022-04-18 DIAGNOSIS — I130 Hypertensive heart and chronic kidney disease with heart failure and stage 1 through stage 4 chronic kidney disease, or unspecified chronic kidney disease: Secondary | ICD-10-CM | POA: Diagnosis present

## 2022-04-18 DIAGNOSIS — K921 Melena: Secondary | ICD-10-CM | POA: Diagnosis not present

## 2022-04-18 DIAGNOSIS — E876 Hypokalemia: Secondary | ICD-10-CM | POA: Diagnosis present

## 2022-04-18 DIAGNOSIS — I880 Nonspecific mesenteric lymphadenitis: Secondary | ICD-10-CM | POA: Diagnosis present

## 2022-04-18 DIAGNOSIS — Z889 Allergy status to unspecified drugs, medicaments and biological substances status: Secondary | ICD-10-CM

## 2022-04-18 DIAGNOSIS — E43 Unspecified severe protein-calorie malnutrition: Secondary | ICD-10-CM | POA: Diagnosis present

## 2022-04-18 DIAGNOSIS — D631 Anemia in chronic kidney disease: Secondary | ICD-10-CM | POA: Diagnosis present

## 2022-04-18 DIAGNOSIS — Z794 Long term (current) use of insulin: Secondary | ICD-10-CM | POA: Diagnosis not present

## 2022-04-18 DIAGNOSIS — E1022 Type 1 diabetes mellitus with diabetic chronic kidney disease: Secondary | ICD-10-CM | POA: Diagnosis present

## 2022-04-18 DIAGNOSIS — E10649 Type 1 diabetes mellitus with hypoglycemia without coma: Secondary | ICD-10-CM | POA: Diagnosis not present

## 2022-04-18 DIAGNOSIS — R1031 Right lower quadrant pain: Secondary | ICD-10-CM | POA: Diagnosis not present

## 2022-04-18 DIAGNOSIS — K8020 Calculus of gallbladder without cholecystitis without obstruction: Secondary | ICD-10-CM | POA: Diagnosis not present

## 2022-04-18 DIAGNOSIS — R935 Abnormal findings on diagnostic imaging of other abdominal regions, including retroperitoneum: Secondary | ICD-10-CM | POA: Diagnosis not present

## 2022-04-18 DIAGNOSIS — I480 Paroxysmal atrial fibrillation: Secondary | ICD-10-CM | POA: Diagnosis present

## 2022-04-18 DIAGNOSIS — K3184 Gastroparesis: Secondary | ICD-10-CM | POA: Diagnosis present

## 2022-04-18 DIAGNOSIS — R7989 Other specified abnormal findings of blood chemistry: Secondary | ICD-10-CM | POA: Diagnosis not present

## 2022-04-18 DIAGNOSIS — I5032 Chronic diastolic (congestive) heart failure: Secondary | ICD-10-CM | POA: Diagnosis present

## 2022-04-18 DIAGNOSIS — F39 Unspecified mood [affective] disorder: Secondary | ICD-10-CM | POA: Diagnosis present

## 2022-04-18 LAB — BASIC METABOLIC PANEL
Anion gap: 13 (ref 5–15)
BUN: 72 mg/dL — ABNORMAL HIGH (ref 8–23)
CO2: 24 mmol/L (ref 22–32)
Calcium: 9.9 mg/dL (ref 8.9–10.3)
Chloride: 100 mmol/L (ref 98–111)
Creatinine, Ser: 2.38 mg/dL — ABNORMAL HIGH (ref 0.44–1.00)
GFR, Estimated: 22 mL/min — ABNORMAL LOW (ref >60)
Glucose, Bld: 88 mg/dL (ref 70–99)
Potassium: 3.9 mmol/L (ref 3.5–5.1)
Sodium: 137 mmol/L (ref 135–145)

## 2022-04-18 LAB — TROPONIN I (HIGH SENSITIVITY): Troponin I (High Sensitivity): 14 ng/L (ref <18)

## 2022-04-18 LAB — CBC WITH DIFFERENTIAL/PLATELET
Abs Immature Granulocytes: 0.04 10*3/uL (ref 0.00–0.07)
Basophils Absolute: 0 10*3/uL (ref 0.0–0.1)
Basophils Relative: 0 %
Eosinophils Absolute: 0.2 10*3/uL (ref 0.0–0.5)
Eosinophils Relative: 2 %
HCT: 33.9 % — ABNORMAL LOW (ref 36.0–46.0)
Hemoglobin: 11.2 g/dL — ABNORMAL LOW (ref 12.0–15.0)
Immature Granulocytes: 1 %
Lymphocytes Relative: 12 %
Lymphs Abs: 1.1 10*3/uL (ref 0.7–4.0)
MCH: 29.7 pg (ref 26.0–34.0)
MCHC: 33 g/dL (ref 30.0–36.0)
MCV: 89.9 fL (ref 80.0–100.0)
Monocytes Absolute: 0.7 10*3/uL (ref 0.1–1.0)
Monocytes Relative: 8 %
Neutro Abs: 6.7 10*3/uL (ref 1.7–7.7)
Neutrophils Relative %: 77 %
Platelets: 130 10*3/uL — ABNORMAL LOW (ref 150–400)
RBC: 3.77 MIL/uL — ABNORMAL LOW (ref 3.87–5.11)
RDW: 13.4 % (ref 11.5–15.5)
WBC: 8.8 10*3/uL (ref 4.0–10.5)
nRBC: 0 % (ref 0.0–0.2)

## 2022-04-18 LAB — HIV ANTIBODY (ROUTINE TESTING W REFLEX): HIV Screen 4th Generation wRfx: NONREACTIVE

## 2022-04-18 LAB — GLUCOSE, CAPILLARY: Glucose-Capillary: 68 mg/dL — ABNORMAL LOW (ref 70–99)

## 2022-04-18 LAB — SARS CORONAVIRUS 2 BY RT PCR: SARS Coronavirus 2 by RT PCR: NEGATIVE

## 2022-04-18 LAB — BRAIN NATRIURETIC PEPTIDE: B Natriuretic Peptide: 112.8 pg/mL — ABNORMAL HIGH (ref 0.0–100.0)

## 2022-04-18 LAB — LACTIC ACID, PLASMA: Lactic Acid, Venous: 0.8 mmol/L (ref 0.5–1.9)

## 2022-04-18 MED ORDER — SODIUM CHLORIDE 0.9 % IV BOLUS (SEPSIS)
500.0000 mL | Freq: Once | INTRAVENOUS | Status: AC
  Administered 2022-04-18: 500 mL via INTRAVENOUS

## 2022-04-18 MED ORDER — LORATADINE 10 MG PO TABS
10.0000 mg | ORAL_TABLET | Freq: Every day | ORAL | Status: DC
  Administered 2022-04-19: 10 mg via ORAL
  Filled 2022-04-18 (×2): qty 1

## 2022-04-18 MED ORDER — SODIUM CHLORIDE 0.9% FLUSH
3.0000 mL | Freq: Two times a day (BID) | INTRAVENOUS | Status: DC
  Administered 2022-04-18 – 2022-04-24 (×11): 3 mL via INTRAVENOUS

## 2022-04-18 MED ORDER — FAMOTIDINE 10 MG PO TABS
10.0000 mg | ORAL_TABLET | Freq: Every day | ORAL | Status: DC
  Administered 2022-04-19 – 2022-04-24 (×6): 10 mg via ORAL
  Filled 2022-04-18 (×6): qty 1

## 2022-04-18 MED ORDER — TORSEMIDE 20 MG PO TABS
20.0000 mg | ORAL_TABLET | ORAL | Status: AC
  Administered 2022-04-18: 20 mg via ORAL
  Filled 2022-04-18: qty 1

## 2022-04-18 MED ORDER — POLYETHYLENE GLYCOL 3350 17 G PO PACK
17.0000 g | PACK | Freq: Every day | ORAL | Status: DC | PRN
  Filled 2022-04-18: qty 1

## 2022-04-18 MED ORDER — ALPRAZOLAM 0.25 MG PO TABS: 0.5000 mg | ORAL_TABLET | Freq: Two times a day (BID) | ORAL | Status: DC | PRN

## 2022-04-18 MED ORDER — ONDANSETRON HCL 4 MG PO TABS
4.0000 mg | ORAL_TABLET | Freq: Four times a day (QID) | ORAL | Status: DC | PRN
  Administered 2022-04-21: 4 mg via ORAL
  Filled 2022-04-18: qty 1

## 2022-04-18 MED ORDER — ENOXAPARIN SODIUM 30 MG/0.3ML IJ SOSY
30.0000 mg | PREFILLED_SYRINGE | INTRAMUSCULAR | Status: DC
  Administered 2022-04-18 – 2022-04-24 (×7): 30 mg via SUBCUTANEOUS
  Filled 2022-04-18 (×7): qty 0.3

## 2022-04-18 MED ORDER — FENTANYL CITRATE PF 50 MCG/ML IJ SOSY: 25.0000 ug | PREFILLED_SYRINGE | INTRAMUSCULAR | Status: DC | PRN

## 2022-04-18 MED ORDER — CALCIUM POLYCARBOPHIL 625 MG PO TABS
625.0000 mg | ORAL_TABLET | Freq: Every day | ORAL | Status: DC
  Administered 2022-04-19 – 2022-04-24 (×6): 625 mg via ORAL
  Filled 2022-04-18 (×9): qty 1

## 2022-04-18 MED ORDER — ROPINIROLE HCL 0.5 MG PO TABS
0.2500 mg | ORAL_TABLET | Freq: Every day | ORAL | Status: DC
  Administered 2022-04-18 – 2022-04-23 (×6): 0.25 mg via ORAL
  Filled 2022-04-18 (×7): qty 1

## 2022-04-18 MED ORDER — TORSEMIDE 20 MG PO TABS: 20.0000 mg | ORAL_TABLET | ORAL | Status: DC

## 2022-04-18 MED ORDER — FAMOTIDINE 20 MG PO TABS
40.0000 mg | ORAL_TABLET | Freq: Every day | ORAL | Status: DC
  Administered 2022-04-18: 40 mg via ORAL
  Filled 2022-04-18: qty 2

## 2022-04-18 MED ORDER — OXYCODONE HCL 5 MG PO TABS
5.0000 mg | ORAL_TABLET | ORAL | Status: DC | PRN
  Administered 2022-04-18 – 2022-04-20 (×4): 5 mg via ORAL
  Filled 2022-04-18 (×4): qty 1

## 2022-04-18 MED ORDER — ISOSORBIDE MONONITRATE ER 60 MG PO TB24
120.0000 mg | ORAL_TABLET | Freq: Every day | ORAL | Status: DC
  Administered 2022-04-18 – 2022-04-19 (×2): 120 mg via ORAL
  Filled 2022-04-18: qty 4
  Filled 2022-04-18: qty 2

## 2022-04-18 MED ORDER — ESCITALOPRAM OXALATE 10 MG PO TABS
5.0000 mg | ORAL_TABLET | Freq: Every day | ORAL | Status: DC
  Administered 2022-04-18 – 2022-04-24 (×7): 5 mg via ORAL
  Filled 2022-04-18 (×7): qty 1

## 2022-04-18 MED ORDER — PRAVASTATIN SODIUM 10 MG PO TABS
10.0000 mg | ORAL_TABLET | Freq: Every day | ORAL | Status: DC
  Administered 2022-04-18 – 2022-04-24 (×7): 10 mg via ORAL
  Filled 2022-04-18 (×8): qty 1

## 2022-04-18 MED ORDER — ONDANSETRON HCL 4 MG/2ML IJ SOLN: 4.0000 mg | Freq: Four times a day (QID) | INTRAMUSCULAR | Status: DC | PRN

## 2022-04-18 MED ORDER — ACETAMINOPHEN 325 MG PO TABS: 650.0000 mg | ORAL_TABLET | Freq: Four times a day (QID) | ORAL | Status: DC | PRN

## 2022-04-18 MED ORDER — ACETAMINOPHEN 650 MG RE SUPP: 650.0000 mg | Freq: Four times a day (QID) | RECTAL | Status: DC | PRN

## 2022-04-18 MED ORDER — FENTANYL CITRATE PF 50 MCG/ML IJ SOSY
25.0000 ug | PREFILLED_SYRINGE | Freq: Once | INTRAMUSCULAR | Status: AC
  Administered 2022-04-18: 25 ug via INTRAVENOUS
  Filled 2022-04-18: qty 1

## 2022-04-18 MED ORDER — TORSEMIDE 20 MG PO TABS
40.0000 mg | ORAL_TABLET | ORAL | Status: DC
  Administered 2022-04-19: 40 mg via ORAL
  Filled 2022-04-18: qty 2

## 2022-04-18 MED ORDER — DOCUSATE SODIUM 100 MG PO CAPS
100.0000 mg | ORAL_CAPSULE | Freq: Two times a day (BID) | ORAL | Status: DC
  Administered 2022-04-18 – 2022-04-19 (×3): 100 mg via ORAL
  Filled 2022-04-18 (×3): qty 1

## 2022-04-18 MED ORDER — HYDRALAZINE HCL 20 MG/ML IJ SOLN: 5.0000 mg | INTRAMUSCULAR | Status: DC | PRN

## 2022-04-18 MED ORDER — ASPIRIN 81 MG PO TBEC
81.0000 mg | DELAYED_RELEASE_TABLET | Freq: Every day | ORAL | Status: DC
  Administered 2022-04-18 – 2022-04-23 (×6): 81 mg via ORAL
  Filled 2022-04-18 (×6): qty 1

## 2022-04-18 MED ORDER — METOPROLOL SUCCINATE ER 25 MG PO TB24
25.0000 mg | ORAL_TABLET | Freq: Every day | ORAL | Status: DC
  Administered 2022-04-18: 25 mg via ORAL
  Filled 2022-04-18 (×2): qty 1

## 2022-04-18 MED ORDER — INSULIN PUMP
Freq: Three times a day (TID) | SUBCUTANEOUS | Status: DC
  Administered 2022-04-19: 5 via SUBCUTANEOUS
  Administered 2022-04-19: 2.1 via SUBCUTANEOUS
  Administered 2022-04-19: 16.8 via SUBCUTANEOUS
  Administered 2022-04-20: 5 via SUBCUTANEOUS
  Administered 2022-04-23: 18.7 via SUBCUTANEOUS
  Filled 2022-04-18: qty 1

## 2022-04-18 MED ORDER — FEBUXOSTAT 40 MG PO TABS
40.0000 mg | ORAL_TABLET | Freq: Every day | ORAL | Status: DC
  Administered 2022-04-18 – 2022-04-23 (×6): 40 mg via ORAL
  Filled 2022-04-18 (×7): qty 1

## 2022-04-18 MED ORDER — ONDANSETRON HCL 4 MG/2ML IJ SOLN
4.0000 mg | Freq: Once | INTRAMUSCULAR | Status: AC
  Administered 2022-04-18: 4 mg via INTRAVENOUS
  Filled 2022-04-18: qty 2

## 2022-04-18 MED ORDER — LATANOPROST 0.005 % OP SOLN
1.0000 [drp] | Freq: Every day | OPHTHALMIC | Status: DC
  Administered 2022-04-18 – 2022-04-23 (×6): 1 [drp] via OPHTHALMIC
  Filled 2022-04-18 (×2): qty 2.5

## 2022-04-18 MED ORDER — FENTANYL CITRATE PF 50 MCG/ML IJ SOSY
50.0000 ug | PREFILLED_SYRINGE | Freq: Once | INTRAMUSCULAR | Status: AC
  Administered 2022-04-18: 50 ug via INTRAVENOUS
  Filled 2022-04-18: qty 1

## 2022-04-18 MED ORDER — BISACODYL 5 MG PO TBEC: 5.0000 mg | DELAYED_RELEASE_TABLET | Freq: Every day | ORAL | Status: DC | PRN

## 2022-04-18 MED ORDER — TICAGRELOR 60 MG PO TABS
60.0000 mg | ORAL_TABLET | Freq: Two times a day (BID) | ORAL | Status: DC
  Administered 2022-04-18 – 2022-04-24 (×13): 60 mg via ORAL
  Filled 2022-04-18 (×15): qty 1

## 2022-04-18 NOTE — ED Provider Notes (Signed)
[facility] EMERGENCY DEPARTMENT Provider Note   CSN: [id number] Arrival date & time: 04/17/22  1514     History  Chief Complaint  Patient presents with   Abdominal Pain    [name] [name] is a 71 y.o. female.  The history is provided by the patient and a relative.  Abdominal Pain Patient with extensive history including aortic stenosis, CHF, chronic kidney disease, diabetes, CAD presents with abdominal pain Patient reports about 4 days ago she woke up with generalized abdominal pain and distention.  She has had nausea but no vomiting She also reports constipation over the past several days She reports the abdominal pain is diffuse and goes into her back.  She also reports some chest symptoms and took nitroglycerin She reports the main issue is her diffuse abdominal pain    Past Medical History:  Diagnosis Date   Anxiety    Aortic stenosis    a. bicuspid aortic valve; mean gradient of 20 mmHg in 2/10; b. s/p bioprosth AVR (19-mm Edwards pericardial valve) with a redo coronary artery bypass graft procedure in 07/2009; postoperative Dressler's syndrome     Arthritis    Atrial fibrillation ([place])    Breast cancer ([place])    Carcinoma, renal cell ([place]) 05/2007   Laparoscopic right nephrectomy   Chromophilic renal cell carcinoma (HCC)    Chronic diastolic CHF (congestive heart failure) ([place])    a. 9.2014 EchoP EF 60-65%, no rwma, bioprosth AVR, mean gradient of 34mHg, mild to mod MR, PASP 530mg.   CKD (chronic kidney disease), stage II    CKD (chronic kidney disease), stage IV (HCC)    Colitis, ischemic ([facility]2008   Coronary artery disease    a. initial CABG with SVG-RCA in 12/05. b. redo SVG-RCA and bioprosthetic AVR in 1/11. d. 9/14 - PCI with DES to distal LM/proximal LAD was done rather than bypass as she would have been a poor candidate for redo sternotomy. d. S/p DES to prox LM 05/2014.   Diabetes mellitus 03/2010    TYPE II: Hemoglobin A1c of 7.4 in   03/2010; 8.4 in 06/2010; treated with insulin pump   Duodenal ulcer    Remote; H. pylori positive   Gastroparesis due to DM ([facility]10/04/2012   Genetic testing 01/14/2017   [name] underwent genetic counseling and testing for hereditary cancer syndromes on 01/01/2017. Her results were negative for mutations in all 83 genes analyzed by [facility]'s 83-gene Common Hereditary Cancers Panel. Genes analyzed include: ALK, APC, ATM, AXIN2, BAP1, BARD1, BLM, BMPR1A, BRCA1, BRCA2, BRIP1, CASR, CDC73, CDH1, CDK4, CDKN1B, CDKN1C, CDKN2A, CEBPA, CHEK2, CTNNA1, DICER1, DIS3L2,    Glaucoma 1998   Heart disease    Hodgkin's disease ([facility] [place]1991   Mantle radiation therapy   Hodgkin's disease ([facility]   Hyperlipidemia    Hyperplastic gastric polyp 06/23/2012   Hypertension    Iron deficiency anemia    a. 03/2013 EGD: essentially normal. Possible slow GIB.   Migraines    NASH (nonalcoholic steatohepatitis) 1999   -biopsy in 1999   OSA on CPAP 04/24/2017   Moderate with AHI 22/hr now on CPAP at 18cm H2o   OSA treated with BiPAP    Osteopenia     hip on DEXA in October 2007.   Osteoporosis    Oxygen deficiency    Pacemaker    six sinus syndrome   PAF (paroxysmal atrial fibrillation) ([facility]   a.  h/o brief episodes noted on ppm interrogation. b. given  GI bleeding and need to be on both ASA 81 and Brilinta, risks likely outweigh benefits of anticoagulation.    Renal insufficiency    Small bowel obstruction (HCC)    Recurrent; resolved after resection of a lipoma   Syncope    a. H/o recurrent syncope with pauses on loop recorder s/p Medtronic pacemaker 07/2012.   Type 1 diabetes ([name])     Home Medications Prior to Admission medications   Medication Sig Start Date End Date Taking? Authorizing Provider  acyclovir ointment (ZOVIRAX) 5 % Apply 1 application topically every 3 (three) hours as needed (fever blister/cold sores).    [provider]  ALPRAZolam Duanne Moron) 0.5 MG tablet Take 1 tablet (0.5  mg total) by mouth 2 (two) times daily as needed for anxiety. 01/15/18   [name], MD  APIDRA 100 UNIT/ML injection Inject 40 Units into the skin as directed. INFUSE THROUGH INSULIN PUMP UTD, Bolus depends on carb intake 02/04/15   [provider]  aspirin EC 81 MG tablet Take 81 mg by mouth at bedtime.    [provider]  BRILINTA 60 MG TABS tablet TAKE ONE TABLET (60MG TOTAL) BY MOUTH TWICE DAILY 12/13/21   Larey Dresser, MD  cetirizine (ZYRTEC) 10 MG tablet Take 10 mg by mouth daily.    [provider]  clobetasol (OLUX) 0.05 % topical foam Apply 1 application topically 2 (two) times daily as needed (skin flaking).    [provider]  Coenzyme Q10 (CO Q 10) 100 MG CAPS Take 100 mg by mouth at bedtime.     [provider]  colchicine 0.6 MG tablet Take 0.6 mg by mouth daily as needed (gout flare).    [provider]  denosumab (PROLIA) 60 MG/ML SOSY injection Inject 60 mg into the skin every 6 (six) months.    [provider]  diclofenac Sodium (VOLTAREN) 1 % GEL as needed.    [provider]  docusate sodium (COLACE) 100 MG capsule Take 100 mg by mouth as needed for mild constipation.    [provider]  escitalopram (LEXAPRO) 5 MG tablet Take 5 mg by mouth daily.    [provider]  famotidine (PEPCID) 40 MG tablet Take 40 mg by mouth daily. Takes a second tablet at night if needed.    [provider]  Finerenone (KERENDIA) 10 MG TABS Take 10 mg by mouth daily. 03/30/22   Larey Dresser, MD  glucagon (GLUCAGON EMERGENCY) 1 MG injection Inject 1 mg into the vein once as needed (low blood sugar).    [provider]  glucose blood (ACCU-CHEK AVIVA PLUS) test strip 1 each by Other route as needed for other. Use as instructed    [provider]  isosorbide mononitrate (IMDUR) 120 MG 24 hr tablet TAKE ONE TABLET (120MG TOTAL) BY MOUTH DAILY 01/10/22   Larey Dresser, MD   latanoprost (XALATAN) 0.005 % ophthalmic solution Place 1 drop into both eyes at bedtime.    [provider]  metolazone (ZAROXOLYN) 2.5 MG tablet Take 2.5 mg (1 tab) once as directed by CHF clinic. Call before taking [phone] 05/31/21   Larey Dresser, MD  metoprolol succinate (TOPROL XL) 25 MG 24 hr tablet Take 1 tablet (25 mg total) by mouth daily. 12/28/21   Larey Dresser, MD  mometasone (NASONEX) 50 MCG/ACT nasal spray Place into the nose. As needed 03/10/20 05/30/22  [provider]  mupirocin cream (BACTROBAN) 2 % Apply 1 application  topically 2 (two) times daily as needed. 02/11/18   [name], MD  nitroGLYCERIN (NITROLINGUAL) 0.4 MG/SPRAY spray PLACE 1 SPRAY UNDER THE TONGUE EVERY 5 MINUTES FOR 3 DOSES AS NEEDED FOR CHEST PAIN. 07/21/21   Larey Dresser, MD  polyethylene glycol (MIRALAX / GLYCOLAX) 17 g packet Take 17 g by mouth as needed.    [provider]  pravastatin (PRAVACHOL) 10 MG tablet TAKE ONE TABLET (10 MG TOTAL) BY MOUTH DAILY. 04/02/22   Larey Dresser, MD  REPATHA SURECLICK 702 MG/ML SOAJ INJECT 140MG INTO THE SKIN EVERY 14 DAYS 02/26/22   Larey Dresser, MD  rOPINIRole (REQUIP) 0.25 MG tablet TAKE ONE TABLET (0.25MG TOTAL) BY MOUTH DAILY. TAKE TWO HOURS PRIOR TO BEDTIME EVERY NIGHT. 10/16/21   Sueanne Margarita, MD  silver nitrate applicators 63-78 % applicator Use as needed to stop superficial skin bleeding. 07/26/18   Vanessa Kick, MD  torsemide (DEMADEX) 20 MG tablet Take 2 tablets by mouth daily alternating with 1m daily. 04/12/22   MLarey Dresser MD  ULORIC 40 MG tablet Take 1 tablet (40 mg total) by mouth at bedtime. 11/29/17   HCaren Macadam MD  UNABLE TO FIND Med Name: So Clean CPAP cleaning system 09/24/17   HCaren Macadam MD  VASCEPA 1 g capsule TAKE TWO CAPSULES (2 GRAMS TOTAL) BY MOUTH TWO TIMES DAILY 02/20/22   MLarey Dresser MD  Wheat Dextrin (BENEFIBER) POWD Take 1 Dose by mouth daily.     [provider]       Allergies    Avandia [rosiglitazone maleate], Cephalexin, Clindamycin, Clindamycin hcl, Humalog [insulin lispro], Lincomycin, Metformin, Novolog [insulin aspart], Penicillins, Rosiglitazone, Tizanidine, Adhesive [tape], Atorvastatin, Cholestyramine, Gentamycin [gentamicin], Iodinated contrast media, Limonene, Prednisone, Ranexa [ranolazine], Rosuvastatin, Simvastatin, Strawberry extract, Azelastine, Clopidogrel, Lisinopril, Oseltamivir phosphate, Tizanidine hcl, Codeine, Erythromycin, Hydrocodone-acetaminophen, Latex, Neomycin, Nickel, Ranitidine, Tamiflu [oseltamivir], and Tramadol    Review of Systems   Review of Systems  Gastrointestinal:  Positive for abdominal pain.    Physical Exam Updated Vital Signs BP (!) 115/57   Pulse 91   Temp 98 F (36.7 C)   Resp (!) 22   Wt 50.8 kg   SpO2 94%   BMI 20.49 kg/m  Physical Exam CONSTITUTIONAL: Ill-appearing HEAD: Normocephalic/atraumatic EYES: EOMI/PERRL, scleral icterus noted ENMT: Mucous membranes moist NECK: supple no meningeal signs SPINE/BACK:entire spine nontender CV: S1/S2 noted, no murmurs/rubs/gallops noted LUNGS: Lungs are clear to auscultation bilaterally, no apparent distress ABDOMEN: soft, mild distention, diffuse moderate tenderness, bowel sounds noted. Insulin pump in place GU:no cva tenderness NEURO: Pt is awake/alert/appropriate, moves all extremitiesx4.  No facial droop.   EXTREMITIES: pulses normal/equal, full ROM SKIN: warm, color normal PSYCH: no abnormalities of mood noted, alert and oriented to situation  ED Results / Procedures / Treatments   Labs (all labs ordered are listed, but only abnormal results are displayed) Labs Reviewed  COMPREHENSIVE METABOLIC PANEL - Abnormal; Notable for the following components:      Result Value   BUN 71 (*)    Creatinine, Ser 2.39 (*)    Total Protein 8.3 (*)    AST 49 (*)    ALT 60 (*)    Alkaline Phosphatase 231 (*)    GFR, Estimated 21 (*)    All other  components within normal limits  CBC - Abnormal; Notable for the following components:   RBC 3.77 (*)    Hemoglobin 11.1 (*)    HCT 34.3 (*)    Platelets 127 (*)  All other components within normal limits  URINALYSIS, ROUTINE W REFLEX MICROSCOPIC - Abnormal; Notable for the following components:   Specific Gravity, Urine <1.005 (*)    All other components within normal limits  BRAIN NATRIURETIC PEPTIDE - Abnormal; Notable for the following components:   B Natriuretic Peptide 112.8 (*)    All other components within normal limits  CBC WITH DIFFERENTIAL/PLATELET - Abnormal; Notable for the following components:   RBC 3.77 (*)    Hemoglobin 11.2 (*)    HCT 33.9 (*)    Platelets 130 (*)    All other components within normal limits  BASIC METABOLIC PANEL - Abnormal; Notable for the following components:   BUN 72 (*)    Creatinine, Ser 2.38 (*)    GFR, Estimated 22 (*)    All other components within normal limits  LIPASE, BLOOD  LACTIC ACID, PLASMA  TROPONIN I (HIGH SENSITIVITY)    EKG EKG Interpretation  Date/Time:  Wednesday April 18 2022 04:05:44 EDT Ventricular Rate:  82 PR Interval:  221 QRS Duration: 100 QT Interval:  429 QTC Calculation: 502 R Axis:   -34 Text Interpretation: Sinus rhythm Prolonged PR interval Left ventricular hypertrophy Prolonged QT interval Confirmed by Ripley Fraise [phone]) on 04/18/2022 4:10:50 AM  Radiology US Abdomen Limited RUQ (LIVER/GB)  Result Date: 04/18/2022 CLINICAL DATA:  Right upper quadrant abdominal pain EXAM: ULTRASOUND ABDOMEN LIMITED RIGHT UPPER QUADRANT COMPARISON:  None Available. FINDINGS: Gallbladder: Multiple layering gallstones are seen within the gallbladder. The gallbladder, however, is not distended, there is no gallbladder wall thickening, and no pericholecystic fluid is identified. The sonographic Percell Miller sign is reportedly negative. Common bile duct: Diameter: 5 mm in proximal diameter Liver: No focal lesion  identified. Within normal limits in parenchymal echogenicity. Portal vein is patent on color Doppler imaging with normal direction of blood flow towards the liver. Other: None. IMPRESSION: Cholelithiasis without sonographic evidence of acute cholecystitis. Electronically Signed   By: Fidela Salisbury M.D.   On: 04/18/2022 04:16   CT ABDOMEN PELVIS WO CONTRAST  Result Date: 04/17/2022 CLINICAL DATA:  Right lower quadrant pain EXAM: CT ABDOMEN AND PELVIS WITHOUT CONTRAST TECHNIQUE: Multidetector CT imaging of the abdomen and pelvis was performed following the standard protocol without IV contrast. RADIATION DOSE REDUCTION: This exam was performed according to the departmental dose-optimization program which includes automated exposure control, adjustment of the mA and/or kV according to patient size and/or use of iterative reconstruction technique. COMPARISON:  09/14/2021 FINDINGS: Lower chest: Pacer wires noted in the right heart. Densely calcified mitral valve annulus. Aortic calcifications. Scarring at the right lung base. No acute abnormality. Hepatobiliary: Gallstones within the gallbladder. No biliary ductal dilatation or focal hepatic abnormality. Pancreas: No focal abnormality or ductal dilatation. Spleen: No focal abnormality.  Normal size. Adrenals/Urinary Tract: Prior right nephrectomy. Exophytic cyst off the midpole of the left kidney measures 2.2 cm in the stable. No follow-up imaging recommended. Punctate nonobstructing stone in the lower pole of the left kidney. No ureteral stones or hydronephrosis. Adrenal glands and urinary bladder unremarkable. Stomach/Bowel: Normal appendix. Stomach, large and small bowel grossly unremarkable. Vascular/Lymphatic: Diffuse aortic atherosclerosis. No evidence of aneurysm or adenopathy. Reproductive: Prior hysterectomy.  No adnexal masses. Other: No free fluid or free air. Haziness within the mesenteric root is new since prior study. This may reflect mesenteric  adenitis/panniculitis. Musculoskeletal: No acute bony abnormality. IMPRESSION: Haziness in the mesenteric root with few prominent mesenteric lymph nodes. This could reflect mesenteric adenitis or panniculitis. Cholelithiasis.  No CT  evidence of acute cholecystitis. Aortic atherosclerosis. Electronically Signed   By: Rolm Baptise M.D.   On: 04/17/2022 18:06    Procedures Procedures    Medications Ordered in ED Medications  sodium chloride 0.9 % bolus 500 mL (has no administration in time range)  fentaNYL (SUBLIMAZE) injection 25 mcg (has no administration in time range)  ondansetron (ZOFRAN) injection 4 mg (has no administration in time range)  fentaNYL (SUBLIMAZE) injection 50 mcg (50 mcg Intravenous Given 04/18/22 0402)  ondansetron (ZOFRAN) injection 4 mg (4 mg Intravenous Given 04/18/22 0401)    ED Course/ Medical Decision Making/ A&P Clinical Course as of 04/18/22 0638  Wed Apr 18, 2022  0454 Patient with multiple comorbidities presenting with abdominal pain.  She appears very uncomfortable.  CT imaging already performed revealed mesenteric adenitis.  Ultrasound performed revealed cholelithiasis but no cholecystitis.  Repeat labs performed as patient has been in the ER for up to 13 hours.  No acute change in her white count.  No change in renal function, no acidosis.  No signs of an occult non-STEMI [DW]  0618 Patient still having nausea and diffuse abdominal tenderness.  Patient is barely able to take any oral fluids.  Patient be admitted for abdominal pain management as well as rehydration. [DW]  0867 Discussed with Dr. Nevada Crane for admission [DW]  [phone] Due to multiple comorbidities, intractable abdominal pain nausea patient will be admitted for pain management and gentle hydration.  Patient is high risk for worsening at home given very little p.o. intake [DW]  [phone] Patient currently chest pain-free, troponin was negative.  She admits to most of her symptoms involved her abdomen [DW]     Clinical Course User Index [DW] Ripley Fraise, MD                           Medical Decision Making Amount and/or Complexity of Data Reviewed Labs: ordered. Radiology: ordered. ECG/medicine tests: ordered.  Risk Prescription drug management. Decision regarding hospitalization.   This patient presents to the ED for concern of abdominal pain, this involves an extensive number of treatment options, and is a complaint that carries with it a high risk of complications and morbidity.  The differential diagnosis includes but is not limited to cholecystitis, cholelithiasis, pancreatitis, gastritis, peptic ulcer disease, appendicitis, bowel obstruction, bowel perforation, diverticulitis, AAA, ischemic bowel    Comorbidities that complicate the patient evaluation: Patient's presentation is complicated by their history of chronic kidney disease, coronary artery disease, diabetes  Additional history obtained: Additional history obtained from family Records reviewed previous admission documents  Lab Tests: I Ordered, and personally interpreted labs.  The pertinent results include: Chronic kidney disease noted, transaminitis  Imaging Studies ordered: I ordered imaging studies including CT scan abdomen pelvis and X-ray right upper quadrant ultrasound   I independently visualized and interpreted imaging which showed mesenteric adenitis, cholelithiasis I agree with the radiologist interpretation  Cardiac Monitoring: The patient was maintained on a cardiac monitor.  I personally viewed and interpreted the cardiac monitor which showed an underlying rhythm of:  sinus rhythm  Medicines ordered and prescription drug management: I ordered medication including fentanyl for pain Reevaluation of the patient after these medicines showed that the patient    improved   Consultations Obtained: I requested consultation with the admitting physician Dr. Nevada Crane , and discussed  findings as well as  pertinent plan - they recommend: Admit  Reevaluation: After the interventions noted above, I reevaluated the patient  and found that they have :improved  Complexity of problems addressed: Patient's presentation is most consistent with  acute presentation with potential threat to life or bodily function  Disposition: After consideration of the diagnostic results and the patient's response to treatment,  I feel that the patent would benefit from admission   .           Final Clinical Impression(s) / ED Diagnoses Final diagnoses:  Mesenteric adenitis  Chronic kidney disease, unspecified CKD stage  Calculus of gallbladder without cholecystitis without obstruction    Rx / DC Orders ED Discharge Orders     None         Ripley Fraise, MD 04/18/22 [phone]

## 2022-04-18 NOTE — Evaluation (Signed)
Occupational Therapy Evaluation Patient Details Name: [name] [name] MRN: [id number] DOB: [birth year] Today's Date: 04/18/2022   History of Present Illness Patient is a 71 yo female presenting to the ED with generalized abdominal pain, nausea, and no BM in 4 days on 04/17/22. Admitted same day with above symptoms. CT findings with mesenteric adenitis. PMH includes: aortic stenosis, CHF, chronic kidney disease, diabetes, CAD   Clinical Impression   Prior to this admission, patient was independent with ADLs and IADLs, and driving. Currently, patient is limited by  weakness, fatigue, pain in her abdomen, and decreased activity tolerance. Patient is currently min guard for ambulation and set up for ADLs. Patient educated to continue to move throughout the day in order prevent increased fatigue and weakness, with patient in agreement. OT not currently recommending therapy at discharge, however will continue to follow acutely.      Recommendations for follow up therapy are one component of a multi-disciplinary discharge planning process, led by the attending physician.  Recommendations may be updated based on patient status, additional functional criteria and insurance authorization.   Follow Up Recommendations  No OT follow up    Assistance Recommended at Discharge PRN  Patient can return home with the following Assist for transportation;Help with stairs or ramp for entrance;Assistance with cooking/housework (initially)    Functional Status Assessment  Patient has had a recent decline in their functional status and demonstrates the ability to make significant improvements in function in a reasonable and predictable amount of time.  Equipment Recommendations  None recommended by OT (Will continue to assess)    Recommendations for Other Services       Precautions / Restrictions Precautions Precautions: Fall Restrictions Weight Bearing Restrictions: No      Mobility Bed Mobility Overal  bed mobility: Needs Assistance Bed Mobility: Sit to Supine, Supine to Sit     Supine to sit: Supervision Sit to supine: Supervision   General bed mobility comments: increased time needed for all movement due to pain and fatigue    Transfers Overall transfer level: Needs assistance   Transfers: Sit to/from Stand Sit to Stand: Min guard           General transfer comment: min gaurd for safety, slightly unsteady and shuffled gait, no need for external assist or reaching out for surfaces      Balance Overall balance assessment: Mild deficits observed, not formally tested                                         ADL either performed or assessed with clinical judgement   ADL Overall ADL's : Needs assistance/impaired Eating/Feeding: Set up;Sitting   Grooming: Set up;Sitting   Upper Body Bathing: Set up;Sitting   Lower Body Bathing: Min guard;Sit to/from stand;Sitting/lateral leans   Upper Body Dressing : Set up;Sitting   Lower Body Dressing: Min guard;Sitting/lateral leans;Sit to/from stand   Toilet Transfer: Min guard;Ambulation;Regular Toilet   Toileting- Clothing Manipulation and Hygiene: Set up;Sitting/lateral lean;Sit to/from stand       Functional mobility during ADLs: Set up General ADL Comments: Patient presenting with weakness, fatigue, and decreased activity tolerance     Vision Baseline Vision/History: 1 Wears glasses (Readers, cataract surgery previousy) Ability to See in Adequate Light: 0 Adequate Patient Visual Report: No change from baseline       Perception     Praxis  Pertinent Vitals/Pain Pain Assessment Pain Assessment: 0-10 Pain Score: 6  Pain Location: abdomen Pain Descriptors / Indicators: Grimacing, Guarding, Discomfort Pain Intervention(s): Limited activity within patient's tolerance, Monitored during session, Patient requesting pain meds-RN notified     Hand Dominance     Extremity/Trunk Assessment Upper  Extremity Assessment Upper Extremity Assessment: Generalized weakness   Lower Extremity Assessment Lower Extremity Assessment: Defer to PT evaluation   Cervical / Trunk Assessment Cervical / Trunk Assessment: Normal   Communication Communication Communication: No difficulties   Cognition Arousal/Alertness: Awake/alert Behavior During Therapy: WFL for tasks assessed/performed Overall Cognitive Status: Within Functional Limits for tasks assessed                                       General Comments       Exercises     Shoulder Instructions      Home Living Family/patient expects to be discharged to:: Private residence Living Arrangements: Spouse/significant other Available Help at Discharge: Available 24 hours/day;Family Type of Home: House Home Access: Stairs to enter [employer] of Steps: 5 Entrance Stairs-Rails: Can reach both Home Layout: Two level;Able to live on main level with bedroom/bathroom;Full bath on main level Alternate Level Stairs-Number of Steps: 13 Alternate Level Stairs-Rails: Left Bathroom Shower/Tub: Walk-in shower (short threshold)   Bathroom Toilet: Handicapped height     Home Equipment: Air cabin crew (4 wheels);Grab bars - tub/shower   Additional Comments: independent      Prior Functioning/Environment Prior Level of Function : Independent/Modified Independent;Driving             Mobility Comments: fully independent ADLs Comments: independent, usually very active        OT Problem List: Decreased strength;Decreased activity tolerance;Decreased coordination;Pain      OT Treatment/Interventions: Self-care/ADL training;Therapeutic exercise;Energy conservation;DME and/or AE instruction;Balance training;Patient/family education;Therapeutic activities;Manual therapy    OT Goals(Current goals can be found in the care plan section) Acute Rehab OT Goals Patient Stated Goal: to figure out whats wrong OT  Goal Formulation: With patient/family Time For Goal Achievement: 05/02/22 Potential to Achieve Goals: Good  OT Frequency: Min 2X/week    Co-evaluation              AM-PAC OT "6 Clicks" Daily Activity     Outcome Measure Help from another person eating meals?: A Little Help from another person taking care of personal grooming?: A Little Help from another person toileting, which includes using toliet, bedpan, or urinal?: A Little Help from another person bathing (including washing, rinsing, drying)?: A Little Help from another person to put on and taking off regular upper body clothing?: A Little Help from another person to put on and taking off regular lower body clothing?: A Little 6 Click Score: 18   End of Session Nurse Communication: Mobility status  Activity Tolerance: Patient limited by lethargy;Patient limited by fatigue;Patient limited by pain Patient left: in bed;with call bell/phone within reach;with family/visitor present  OT Visit Diagnosis: Unsteadiness on feet (R26.81);Muscle weakness (generalized) (M62.81);Pain Pain - Right/Left:  (Abdomen) Pain - part of body:  (Abdomen)                Time: 3903-0092 OT Time Calculation (min): 20 min Charges:  OT General Charges $OT Visit: 1 Visit OT Evaluation $OT Eval Moderate Complexity: 1 Mod  Corinne Ports E. Nikai Quest, OTR/L Acute Rehabilitation Services [phone]   Ascencion Dike 04/18/2022, 2:36 PM

## 2022-04-18 NOTE — H&P (Signed)
History and Physical    Patient: [name] [name] SUP:[id number] DOB: 1950/09/28 DOA: 04/17/2022 DOS: the patient was seen and examined on 04/18/2022 PCP: [name], MD  Patient coming from: Home - lives with husband; NOK: [name] [name], [name] [name], [phone]   Chief Complaint: Abdominal pain  HPI: [name] [name] is a 71 y.o. female with medical history significant of afib; breast cancer; RCC s/p R nephrectomy; chronic diastolic CHF; stage 4 CKD; CAD s/p CABG; DM with gastroparesis; glaucoma; Hodgkin's disease; HLD; HTN; NASH; OSA on BIPAP; pacemaker placement; and recurrent syncope s/p loop recorder presenting with abdominal pain.  She reports that she was recently started on Kerendia (a non-steroidal mineralocorticoid that appears to be similar to spironolactone) for CHF/CKD.  She started on 9/22 with abdominal pain and bloating.  She has chronic fatigue associated with BIPAP-dependent OSA but was sleeping almost all day.  She developed anorexia and malaise.  She decided that it might be from the [place] and so stopped it on 9/25.  She went to Urgent Care yesterday and was encouraged to go to the ER.  No BM since last Friday.  Persistent nausea.  Symptoms have not improved since stopping [place].    ER Course:  Carryover, per Dr. Nevada Crane:  71 year old female with history of aortic stenosis, coronary artery disease, CHF, CKD, type 2 diabetes, who presented with complaints of abdominal pain and distention.  Associated with nausea with no vomiting.  Endorses constipation over the last several days.  Abdominal pain is diffuse and radiates to her back.  Work-up in the ED revealed mesenteric adenitis and cholelithiasis with no CT evidence of acute cholecystitis.  Still in pain in the ED despite analgesics.  Additionally, she is dehydrated, receiving IV fluid bolus.  EDP requested admission for pain control and hydration.     Review of Systems: As mentioned in the history of present illness.  All other systems reviewed and are negative. Past Medical History:  Diagnosis Date   Anxiety    Aortic stenosis    a. bicuspid aortic valve; mean gradient of 20 mmHg in 2/10; b. s/p bioprosth AVR (19-mm Edwards pericardial valve) with a redo coronary artery bypass graft procedure in 07/2009; postoperative Dressler's syndrome     Arthritis    Breast cancer ([place])    Carcinoma, renal cell ([place]) 05/2007   Laparoscopic right nephrectomy   Chronic diastolic CHF (congestive heart failure) ([place])    a. 9.2014 EchoP EF 60-65%, no rwma, bioprosth AVR, mean gradient of 47mHg, mild to mod MR, PASP 550mg.   CKD (chronic kidney disease), stage IV (HCC)    Colitis, ischemic ([facility]   Coronary artery disease    a. initial CABG with SVG-RCA in 12/05. b. redo SVG-RCA and bioprosthetic AVR in 1/11. d. 9/14 - PCI with DES to distal LM/proximal LAD was done rather than bypass as she would have been a poor candidate for redo sternotomy. d. S/p DES to prox LM 05/2014.   Diabetes mellitus 03/2010    TYPE II: Hemoglobin A1c of 7.4 in  03/2010; 8.4 in 06/2010; treated with insulin pump   Duodenal ulcer    Remote; H. pylori positive   Gastroparesis due to DM ([facility]10/04/2012   Glaucoma 1998   Hodgkin's disease ([facility]1991   [place] radiation therapy   Hyperlipidemia    Hyperplastic gastric polyp 06/23/2012   Hypertension    Iron deficiency anemia    a. 03/2013 EGD: essentially normal. Possible slow GIB.   Migraines  NASH (nonalcoholic steatohepatitis) 1999   -biopsy in 1999   OSA treated with BiPAP    Osteoporosis    Pacemaker    six sinus syndrome   PAF (paroxysmal atrial fibrillation) ([place])    a.  h/o brief episodes noted on ppm interrogation. b. given GI bleeding and need to be on both ASA 81 and Brilinta, risks likely outweigh benefits of anticoagulation.    Small bowel obstruction (HCC)    Recurrent; resolved after resection of a lipoma   Syncope    a. H/o recurrent syncope with pauses on loop  recorder s/p Medtronic pacemaker 07/2012.   Past Surgical History:  Procedure Laterality Date   ABDOMINAL HYSTERECTOMY  2000   AORTIC VALVE REPLACEMENT  2011   Aortic valve replacement surgery with a 19 mm bioprosthetic device post redo CABG January 2011.   BONE MARROW BIOPSY     BREAST EXCISIONAL BIOPSY     benign, bilateral   CARDIAC CATHETERIZATION     CERVICAL BIOPSY     COLONOSCOPY     Multiple with adenomatous polyps   COLONOSCOPY  06/23/2012   Procedure: COLONOSCOPY;  Surgeon: Gatha Mayer, MD;  Location: [facility];  Service: Endoscopy;  Laterality: N/A;   CORONARY ARTERY BYPASS GRAFT  2005, 2011   CABG-most recent SVG to RCA-12/05; RCA occluded with patent graft in 2006; Redo bypass in 07/2009   ESOPHAGOGASTRODUODENOSCOPY     ESOPHAGOGASTRODUODENOSCOPY  06/23/2012   Procedure: ESOPHAGOGASTRODUODENOSCOPY (EGD);  Surgeon: Gatha Mayer, MD;  Location: [facility];  Service: Endoscopy;  Laterality: N/A;   ESOPHAGOGASTRODUODENOSCOPY N/A 04/06/2013   Procedure: ESOPHAGOGASTRODUODENOSCOPY (EGD);  Surgeon: Irene Shipper, MD;  Location: [facility] ENDOSCOPY;  Service: Endoscopy;  Laterality: N/A;   EXPLORATORY LAPAROTOMY W/ BOWEL RESECTION     Small bowel resection of lipoma   GIVENS CAPSULE STUDY N/A 04/30/2013   Procedure: GIVENS CAPSULE STUDY;  Surgeon: Gatha Mayer, MD;  Location: [place];  Service: Endoscopy;  Laterality: N/A;   LEFT AND RIGHT HEART CATHETERIZATION WITH CORONARY ANGIOGRAM N/A 04/07/2013   Procedure: LEFT AND RIGHT HEART CATHETERIZATION WITH CORONARY ANGIOGRAM;  Surgeon: Larey Dresser, MD;  Location: [facility] CATH LAB;  Service: Cardiovascular;  Laterality: N/A;   LEFT ATRIAL APPENDAGE OCCLUSION N/A 02/16/2016   Watchman not placed - nickel allergy   LEFT HEART CATHETERIZATION WITH CORONARY/GRAFT ANGIOGRAM N/A 06/07/2014   Procedure: LEFT HEART CATHETERIZATION WITH Beatrix Fetters;  Surgeon: Burnell Blanks, MD;  Location[facility] C[facility] LAB;  Service:  Cardiovascular;  Laterality: N/A;   LIVER BIOPSY     LOOP RECORDER IMPLANT N/A 12/10/2011   Procedure: LOOP RECORDER IMPLANT;  Surgeon: Evans Lance, MD;  Location: [facility] CATH LAB;  Service: Cardiovascular;  Laterality: N/A;   MALONEY DILATION  06/23/2012   Procedure: [name] DILATION;  Surgeon: Gatha Mayer, MD;  Location: [facility] ENDOSCOPY;  Service: Endoscopy;;  37 fr   MASTECTOMY Left 05/17/2015   total    NEPHRECTOMY  2008   Laparoscopic right nephrectomy, renal cell carcinoma   PACEMAKER INSERTION  08/21/2012   Medtronic Adapta L dual-chamber pacemaker   PERCUTANEOUS CORONARY STENT INTERVENTION (PCI-S) N/A 04/09/2013   Procedure: PERCUTANEOUS CORONARY STENT INTERVENTION (PCI-S);  Surgeon: Burnell Blanks, MD;  Location: [facility] CATH LAB;  Service: Cardiovascular;  Laterality: N/A;   PERMANENT PACEMAKER INSERTION N/A 08/21/2012   Procedure: PERMANENT PACEMAKER INSERTION;  Surgeon: Evans Lance, MD;  Location: [facility] CATH LAB;  Service: Cardiovascular;  Laterality: N/A;   RIGHT HEART CATH N/A  03/18/2019   Procedure: RIGHT HEART CATH;  Surgeon: Larey Dresser, MD;  Location: [place] CV LAB;  Service: Cardiovascular;  Laterality: N/A;   RIGHT HEART CATH AND CORONARY/GRAFT ANGIOGRAPHY N/A 04/19/2017   Procedure: RIGHT HEART CATH AND CORONARY/GRAFT ANGIOGRAPHY;  Surgeon: Larey Dresser, MD;  Location: [place] CV LAB;  Service: Cardiovascular;  Laterality: N/A;   TEE WITHOUT CARDIOVERSION N/A 12/07/2015   Procedure: TRANSESOPHAGEAL ECHOCARDIOGRAM (TEE);  Surgeon: Larey Dresser, MD;  Location: [place];  Service: Cardiovascular;  Laterality: N/A;   TOTAL ABDOMINAL HYSTERECTOMY W/ BILATERAL SALPINGOOPHORECTOMY  2000   endometriosis and ovarian cysts   TOTAL MASTECTOMY Left 05/17/2015   Procedure: LEFT TOTAL MASTECTOMY;  Surgeon: Rolm Bookbinder, MD;  Location: [place];  Service: General;  Laterality: Left;   TUMOR EXCISION  1981   removal of Hodgkins lymphoma   Social History:   reports that she has never smoked. She has never used smokeless tobacco. She reports that she does not drink alcohol and does not use drugs.  Allergies  Allergen Reactions   Avandia [Rosiglitazone Maleate] Other (See Comments)    Congestive heart failure   Cephalexin Anaphylaxis, Swelling and Rash    Extremities swell , throat swelling    Clindamycin Anaphylaxis and Shortness Of Breath    Other reaction(s): esophageal spasms   Clindamycin Hcl Anaphylaxis and Shortness Of Breath   Humalog [Insulin Lispro] Itching     big knots and whelp    Lincomycin Anaphylaxis and Shortness Of Breath   Metformin Other (See Comments)    Congestive heart failure   Novolog [Insulin Aspart] Hives and Itching    Humalog & Novolog big knots and whelp     Penicillins Anaphylaxis, Hives, Swelling and Rash    Did it involve swelling of the face/tongue/throat, SOB, or low BP? Yes Did it involve sudden or severe rash/hives, skin peeling, or any reaction on the inside of your mouth or nose? No Did you need to seek medical attention at a hospital or doctor's office? Yes When did it last happen?      Childhood allergy  If all above answers are "NO", may proceed with cephalosporin use.    Rosiglitazone Other (See Comments)    Congestive heart failure   Tizanidine Other (See Comments)    Dizziness, Mental Status Changes, Hallucination     Adhesive [Tape] Other (See Comments)    Tears skin, Please use "paper" tape   Atorvastatin Other (See Comments)    increased LFT's, no muscle weakness, leg pain Muscle and joint pain increased LFT's  Increased LFTs   Cholestyramine Other (See Comments)    Liver Disorder Elevated LFTs     Gentamycin [Gentamicin] Hives, Itching and Rash    Fine red bumps.  Reaction noted post loop recorder implant, where gentamycin was used for irrigation. Topical prep   Iodinated Contrast Media Other (See Comments)    PAIN DURING LYMPHANGIOGRAM '81, NO ALLERGY TO IV CONTRAST.  Has  had procedures with Iodine since without problems.    Limonene Hives, Itching, Rash and Other (See Comments)    Reaction noted post loop recorder implant, where gentamycin was used for irrigation. Topical prep   Prednisone Itching and Other (See Comments)    B/P went high, itching all over.  Tolerates with benadryl    Ranexa [Ranolazine] Other (See Comments)    Nausea, dizziness, low blood sugar, tingling in right hand, "blood blisters" on tongue    Rosuvastatin Nausea And Vomiting, Nausea Only and  Other (See Comments)    Muscle and joint pain & increased LFTs; tolerates Pravastatin 84m (max) Other reaction(s): elevated LFTs   Simvastatin Other (See Comments)    Increased LFT's    Strawberry Extract Hives, Itching, Rash and Other (See Comments)    Reaction noted post loop recorder implant, where gentamycin was used for irrigation. Topical prep.  Can eat fresh strawberries    Azelastine     Other reaction(s): metallic taste,spitting up blood   Clopidogrel Other (See Comments)    Does not work for patient Medicine was not effective Other reaction(s): Unknown   Lisinopril     Other reaction(s): nausea and loose stools   Oseltamivir Phosphate     Other reaction(s): Nausea and vomiting   Tizanidine Hcl     Other reaction(s): dizzy, hallucinations   Codeine Nausea And Vomiting   Erythromycin Nausea And Vomiting   Hydrocodone-Acetaminophen Itching, Rash and Other (See Comments)    itching   Latex Itching and Other (See Comments)    I"if wear gloves hands get red ,itchy no prioblem if other wear and touch me"   Neomycin Rash and Other (See Comments)    Redness   Nickel Itching   Ranitidine Nausea Only   Tamiflu [Oseltamivir] Nausea And Vomiting   Tramadol Nausea Only    Family History  Problem Relation Age of Onset   Heart attack Mother        d.61   Heart disease Mother    Anal fissures Mother    Heart attack Father        d.679  Diabetes Father    Heart disease Father     Lung cancer Sister        d.61 metastases to brain. History of smoking. Maternal half-sister.   Cancer Sister 30       unspecified gynecologic cancer (either cervical or ovarian) in her 363s   Breast cancer Sister 472      d.42 from metastatic disease. Paternal half-sister.   Hypertension Brother    Glaucoma Brother    Parkinson's disease Paternal Aunt    Heart attack Maternal Grandmother    Cancer Maternal Grandfather 582      d.52s oral cancer   Colon cancer Neg Hx    Stomach cancer Neg Hx    Parkinsonism Neg Hx     Prior to Admission medications   Medication Sig Start Date End Date Taking? Authorizing Provider  acyclovir ointment (ZOVIRAX) 5 % Apply 1 application topically every 3 (three) hours as needed (fever blister/cold sores).    [provider]  ALPRAZolam (Duanne Moron 0.5 MG tablet Take 1 tablet (0.5 mg total) by mouth 2 (two) times daily as needed for anxiety. 01/15/18   HCaren Macadam MD  APIDRA 100 UNIT/ML injection Inject 40 Units into the skin as directed. INFUSE THROUGH INSULIN PUMP UTD, Bolus depends on carb intake 02/04/15   [provider]  aspirin EC 81 MG tablet Take 81 mg by mouth at bedtime.    [provider]  BRILINTA 60 MG TABS tablet TAKE ONE TABLET (60MG TOTAL) BY MOUTH TWICE DAILY 12/13/21   MLarey Dresser MD  cetirizine (ZYRTEC) 10 MG tablet Take 10 mg by mouth daily.    [provider]  clobetasol (OLUX) 0.05 % topical foam Apply 1 application topically 2 (two) times daily as needed (skin flaking).    [provider]  Coenzyme Q10 (CO Q 10) 100 MG CAPS Take 100 mg by  mouth at bedtime.     [provider]  colchicine 0.6 MG tablet Take 0.6 mg by mouth daily as needed (gout flare).    [provider]  denosumab (PROLIA) 60 MG/ML SOSY injection Inject 60 mg into the skin every 6 (six) months.    [provider]  diclofenac Sodium (VOLTAREN) 1 % GEL as needed.    [provider]   docusate sodium (COLACE) 100 MG capsule Take 100 mg by mouth as needed for mild constipation.    [provider]  escitalopram (LEXAPRO) 5 MG tablet Take 5 mg by mouth daily.    [provider]  famotidine (PEPCID) 40 MG tablet Take 40 mg by mouth daily. Takes a second tablet at night if needed.    [provider]  Finerenone (KERENDIA) 10 MG TABS Take 10 mg by mouth daily. 03/30/22   Larey Dresser, MD  glucagon (GLUCAGON EMERGENCY) 1 MG injection Inject 1 mg into the vein once as needed (low blood sugar).    [provider]  glucose blood (ACCU-CHEK AVIVA PLUS) test strip 1 each by Other route as needed for other. Use as instructed    [provider]  isosorbide mononitrate (IMDUR) 120 MG 24 hr tablet TAKE ONE TABLET (120MG TOTAL) BY MOUTH DAILY 01/10/22   Larey Dresser, MD  latanoprost (XALATAN) 0.005 % ophthalmic solution Place 1 drop into both eyes at bedtime.    [provider]  metolazone (ZAROXOLYN) 2.5 MG tablet Take 2.5 mg (1 tab) once as directed by CHF clinic. Call before taking [phone] 05/31/21   Larey Dresser, MD  metoprolol succinate (TOPROL XL) 25 MG 24 hr tablet Take 1 tablet (25 mg total) by mouth daily. 12/28/21   Larey Dresser, MD  mometasone (NASONEX) 50 MCG/ACT nasal spray Place into the nose. As needed 03/10/20 05/30/22  [provider]  mupirocin cream (BACTROBAN) 2 % Apply 1 application topically 2 (two) times daily as needed. 02/11/18   [name], MD  nitroGLYCERIN (NITROLINGUAL) 0.4 MG/SPRAY spray PLACE 1 SPRAY UNDER THE TONGUE EVERY 5 MINUTES FOR 3 DOSES AS NEEDED FOR CHEST PAIN. 07/21/21   Larey Dresser, MD  polyethylene glycol (MIRALAX / GLYCOLAX) 17 g packet Take 17 g by mouth as needed.    [provider]  pravastatin (PRAVACHOL) 10 MG tablet TAKE ONE TABLET (10 MG TOTAL) BY MOUTH DAILY. 04/02/22   Larey Dresser, MD  REPATHA SURECLICK 619 MG/ML SOAJ INJECT 140MG INTO THE SKIN  EVERY 14 DAYS 02/26/22   Larey Dresser, MD  rOPINIRole (REQUIP) 0.25 MG tablet TAKE ONE TABLET (0.25MG TOTAL) BY MOUTH DAILY. TAKE TWO HOURS PRIOR TO BEDTIME EVERY NIGHT. 10/16/21   Sueanne Margarita, MD  silver nitrate applicators 50-93 % applicator Use as needed to stop superficial skin bleeding. 07/26/18   Vanessa Kick, MD  torsemide (DEMADEX) 20 MG tablet Take 2 tablets by mouth daily alternating with 21m daily. 04/12/22   MLarey Dresser MD  ULORIC 40 MG tablet Take 1 tablet (40 mg total) by mouth at bedtime. 11/29/17   HCaren Macadam MD  UNABLE TO FIND Med Name: So Clean CPAP cleaning system 09/24/17   HCaren Macadam MD  VASCEPA 1 g capsule TAKE TWO CAPSULES (2 GRAMS TOTAL) BY MOUTH TWO TIMES DAILY 02/20/22   MLarey Dresser MD  Wheat Dextrin (BENEFIBER) POWD Take 1 Dose by mouth daily.     [provider]    Physical Exam:  Vitals:   04/18/22 1015 04/18/22 1045 04/18/22 1100 04/18/22 1237  BP: 95/67 (!) 102/52 99/60 (!) 111/54  Pulse: 87 86 86 83  Resp: (!) 22 18 15 17   Temp:    98.4 F (36.9 C)  TempSrc:    Oral  SpO2: 97% 100% 98% 99%  Weight:       General:  Appears calm and comfortable and is in NAD, reports hunger and very conversant Eyes:   EOMI, normal lids, iris ENT:  grossly normal hearing, lips & tongue, mmm Neck:  no LAD, masses or thyromegaly Cardiovascular:  RRR, no m/r/g. No LE edema.  Respiratory:   CTA bilaterally with no wheezes/rales/rhonchi.  Normal respiratory effort. Abdomen:  soft, mildly diffusely tender, moderate distention Skin:  no rash or induration seen on limited exam Musculoskeletal:  grossly normal tone BUE/BLE, good ROM, no bony abnormality Psychiatric:  grossly normal mood and affect, speech fluent and appropriate, AOx3 Neurologic:  CN 2-12 grossly intact, moves all extremities in coordinated fashion   Radiological Exams on Admission: Independently reviewed - see discussion in A/P where applicable  US Abdomen Limited RUQ  (LIVER/GB)  Result Date: 04/18/2022 CLINICAL DATA:  Right upper quadrant abdominal pain EXAM: ULTRASOUND ABDOMEN LIMITED RIGHT UPPER QUADRANT COMPARISON:  None Available. FINDINGS: Gallbladder: Multiple layering gallstones are seen within the gallbladder. The gallbladder, however, is not distended, there is no gallbladder wall thickening, and no pericholecystic fluid is identified. The sonographic Percell Miller sign is reportedly negative. Common bile duct: Diameter: 5 mm in proximal diameter Liver: No focal lesion identified. Within normal limits in parenchymal echogenicity. Portal vein is patent on color Doppler imaging with normal direction of blood flow towards the liver. Other: None. IMPRESSION: Cholelithiasis without sonographic evidence of acute cholecystitis. Electronically Signed   By: Fidela Salisbury M.D.   On: 04/18/2022 04:16   CT ABDOMEN PELVIS WO CONTRAST  Result Date: 04/17/2022 CLINICAL DATA:  Right lower quadrant pain EXAM: CT ABDOMEN AND PELVIS WITHOUT CONTRAST TECHNIQUE: Multidetector CT imaging of the abdomen and pelvis was performed following the standard protocol without IV contrast. RADIATION DOSE REDUCTION: This exam was performed according to the departmental dose-optimization program which includes automated exposure control, adjustment of the mA and/or kV according to patient size and/or use of iterative reconstruction technique. COMPARISON:  09/14/2021 FINDINGS: Lower chest: Pacer wires noted in the right heart. Densely calcified mitral valve annulus. Aortic calcifications. Scarring at the right lung base. No acute abnormality. Hepatobiliary: Gallstones within the gallbladder. No biliary ductal dilatation or focal hepatic abnormality. Pancreas: No focal abnormality or ductal dilatation. Spleen: No focal abnormality.  Normal size. Adrenals/Urinary Tract: Prior right nephrectomy. Exophytic cyst off the midpole of the left kidney measures 2.2 cm in the stable. No follow-up imaging recommended.  Punctate nonobstructing stone in the lower pole of the left kidney. No ureteral stones or hydronephrosis. Adrenal glands and urinary bladder unremarkable. Stomach/Bowel: Normal appendix. Stomach, large and small bowel grossly unremarkable. Vascular/Lymphatic: Diffuse aortic atherosclerosis. No evidence of aneurysm or adenopathy. Reproductive: Prior hysterectomy.  No adnexal masses. Other: No free fluid or free air. Haziness within the mesenteric root is new since prior study. This may reflect mesenteric adenitis/panniculitis. Musculoskeletal: No acute bony abnormality. IMPRESSION: Haziness in the mesenteric root with few prominent mesenteric lymph nodes. This could reflect mesenteric adenitis or panniculitis. Cholelithiasis.  No CT evidence of acute cholecystitis. Aortic atherosclerosis. Electronically Signed   By: Rolm Baptise M.D.   On: 04/17/2022 18:06    EKG: Independently reviewed.  NSR with  rate 82; LVH; prolonged QTc 502; nonspecific ST changes with no evidence of acute ischemia   Labs on Admission: I have personally reviewed the available labs and imaging studies at the time of the admission.  Pertinent labs:    BUN 72/Creatinine 2.38/GFR 22 - stable AST 49/ALT 60/Bili 1.2 BNP 112.8 HS troponin 14 Lactate 0.8 WBC 8.8 Hgb 11.2 Platelets 130 UA WNL   Assessment and Plan: Principal Problem:   Mesenteric adenitis Active Problems:   Hyperlipidemia   Hypertension   Arteriosclerotic cardiovascular disease (ASCVD)   Pacemaker   Chronic diastolic CHF (congestive heart failure) (HCC)   PAF (paroxysmal atrial fibrillation) (HCC)   Insulin dependent type 1 diabetes mellitus (HCC)   Obstructive sleep apnea treated with BiPAP   CKD (chronic kidney disease) stage 4, GFR 15-29 ml/min (HCC)   Multiple drug allergies    Mesenteric adenitis -Patient with chronic fatigue associated with severe OSA and innumerable drug allergies presenting with abdominal pain and nausea, anorexia -Symptoms  started about a week ago and she initially thought they were related to medication Carrington Clamp), although this currently appears less likely -Imaging shows mesenteric adenitis -She does not have a prodromal infection, although her husband does have COVID-like symptoms at this time; will test for COVID but hold antibiotics -Supportive care -Bowel regimen (has not had a BM since onset of symptoms 5 days ago)  Chronic diastolic CHF -07/6999 echo with EF 60-65% and grade 2 diastolic dysfunction -Appears to be compensated currently, but her CKD complicates her fluid balance -She was changed from spironolactone to [place] recently but this is on hold for now -Will defer to cardiology  Stage 4 CKD -s/p nephrectomy -Also with diabetic nephropathy -Appears to be stable at this time -She needs outpatient nephrology f/u and may need HD access fairly soon in anticipation of future need for HD  OSA -On nocturnal BIPAP -Reports that she is still having 10-20 episodes a night and needs a new sleep study -She has chronic fatigue and chronic daytime somnolence but current symptoms are worse than baseline -Will continue BIPAP qhs and with naps for now  CAD  -s/p CABG -Has known disease but intervention would be difficult and she is not a CABG candidate -She did have mild CP last weekend but none currently -Negative troponin -Continue Brilinta, ASA, Imdur, Toprol XL  DM with gastroparesis -Continue insulin pump -Some of her nausea may be associated with gastroparesis  HTN -Continue metoprolol  HLD -Continue pravastatin -She also takes Repatha and Vascepa as an outpatient  Afib -Has pacemaker -Rate controlled with metoprolol -Not on AC due to recurrent GI bleeds -Cannot have Watchman due to nickel allergy  Mood d/o -Continue alprazolam, escitalopram  Multiple drug allergies -She may need eventual testing because she may otherwise be unable to tolerate various classes of  medications  Glaucoma -Continue latanoprost   Advance Care Planning:   Code Status: Full Code   Consults: PT/OT; Nutrition; TOC team  DVT Prophylaxis: Lovenox  Family Communication: Daughter was present throughout evaluation  Severity of Illness: The appropriate patient status for this patient is INPATIENT. Inpatient status is judged to be reasonable and necessary in order to provide the required intensity of service to ensure the patient's safety. The patient's presenting symptoms, physical exam findings, and initial radiographic and laboratory data in the context of their chronic comorbidities is felt to place them at high risk for further clinical deterioration. Furthermore, it is not anticipated that the patient will be medically stable for discharge  from the hospital within 2 midnights of admission.   * I certify that at the point of admission it is my clinical judgment that the patient will require inpatient hospital care spanning beyond 2 midnights from the point of admission due to high intensity of service, high risk for further deterioration and high frequency of surveillance required.*  Author: Karmen Bongo, MD 04/18/2022 1:08 PM  For on call review [email or web address].

## 2022-04-18 NOTE — Inpatient Diabetes Management (Addendum)
Inpatient Diabetes Program Recommendations  AACE/ADA: New Consensus Statement on Inpatient Glycemic Control (2015)  Target Ranges:  Prepandial:   less than 140 mg/dL      Peak postprandial:   less than 180 mg/dL (1-2 hours)      Critically ill patients:  140 - 180 mg/dL   Lab Results  Component Value Date   GLUCAP 130 (H) 03/18/2019   HGBA1C 6.9 (H) 05/10/2015    Review of Glycemic Control  Diabetes history: DM 1 Outpatient Diabetes medications: Apidra insulin Pump Settings: Medtronic 780G, Guardian glucose sensor Active insulin Time: 2 hours Start Time Basal Rate CF CR Target BG  12:00 AM 1.1  (CF-15, CR- 4.0, Target- 120) 5:00 AM 2.75  7 AM-5  11 AM-2.8  1:30 PM 4.00  4 PM-2  7 PM 4.60  9 PM-3.5   CGM: Guardian 4 Endocrinology: [facility], last visit with Velna Hatchet, PA on 9/25  Current orders for Inpatient glycemic control:  Insulin pump order set  Pump site changed last on 9/28 she changes site every 2 days, current site on right upper quadrant on abd. Guardian sensor site right arm, last changed 9/27, changed every 7 days. Pt has extra pump supplies and Apidra insulin with her in the hospital   Will follow glucose trends while here.   Thanks,  Tama Headings RN, MSN, BC-ADM Inpatient Diabetes Coordinator Team Pager [phone] (8a-5p)

## 2022-04-18 NOTE — Evaluation (Signed)
Physical Therapy Evaluation Patient Details Name: [name] [name] MRN: [id number] DOB: 01/08/1951 Today's Date: 04/18/2022  History of Present Illness  Patient is a 71 yo female presenting to the ED with generalized abdominal pain, nausea, and no BM in 4 days on 04/17/22. Admitted same day with above symptoms. CT findings with mesenteric adenitis. PMH includes: aortic stenosis, CHF, chronic kidney disease, diabetes, CAD  Clinical Impression  Pt presents to PT with deficits in strength, gait, balance, endurance, power. Pt is generally weak and demonstrates instability when mobilizing without UE support. Pt's balance improves some with increased ambulation distances this session. PT encourages aggressive ambulation in an effort to improve strength and endurance. PT anticipates no PT needs at the time of discharge, pending continued mobilization during admission.     Recommendations for follow up therapy are one component of a multi-disciplinary discharge planning process, led by the attending physician.  Recommendations may be updated based on patient status, additional functional criteria and insurance authorization.  Follow Up Recommendations No PT follow up      Assistance Recommended at Discharge Intermittent Supervision/Assistance  Patient can return home with the following  A little help with walking and/or transfers;A little help with bathing/dressing/bathroom;Help with stairs or ramp for entrance;Assistance with cooking/housework    Equipment Recommendations None recommended by PT  Recommendations for Other Services       Functional Status Assessment Patient has had a recent decline in their functional status and demonstrates the ability to make significant improvements in function in a reasonable and predictable amount of time.     Precautions / Restrictions Precautions Precautions: Fall Restrictions Weight Bearing Restrictions: No      Mobility  Bed Mobility Overal bed  mobility: Modified Independent Bed Mobility: Supine to Sit, Sit to Supine     Supine to sit: Modified independent (Device/Increase time) Sit to supine: Modified independent (Device/Increase time)        Transfers Overall transfer level: Needs assistance Equipment used: None Transfers: Sit to/from Stand Sit to Stand: Min guard                Ambulation/Gait Ambulation/Gait assistance: Min guard Gait Distance (Feet): 100 Feet Assistive device: 1 person hand held assist Gait Pattern/deviations: Step-through pattern Gait velocity: reduced Gait velocity interpretation: <1.31 ft/sec, indicative of household ambulator   General Gait Details: pt with slowed step-through gait, reduced stride length when ambulating without UE support, slow and cautious  Stairs            Wheelchair Mobility    Modified Rankin (Stroke Patients Only)       Balance Overall balance assessment: Needs assistance Sitting-balance support: Feet supported, No upper extremity supported Sitting balance-Leahy Scale: Good     Standing balance support: No upper extremity supported, During functional activity Standing balance-Leahy Scale: Fair                               Pertinent Vitals/Pain Pain Assessment Pain Assessment: Faces Faces Pain Scale: Hurts a little bit Pain Location: abdomen Pain Descriptors / Indicators: Grimacing Pain Intervention(s): Monitored during session    Home Living Family/patient expects to be discharged to:: Private residence Living Arrangements: Spouse/significant other Available Help at Discharge: Available 24 hours/day;Family Type of Home: House Home Access: Stairs to enter Entrance Stairs-Rails: Can reach both Entrance Stairs-Number of Steps: 5 Alternate Level Stairs-Number of Steps: 13 Home Layout: Two level;Able to live on main level with bedroom/bathroom;Full bath  on main level Home Equipment: Air cabin crew (4 wheels);Grab bars -  tub/shower Additional Comments: independent    Prior Function Prior Level of Function : Independent/Modified Independent;Driving             Mobility Comments: fully independent ADLs Comments: independent, usually very active     Hand Dominance        Extremity/Trunk Assessment   Upper Extremity Assessment Upper Extremity Assessment: Defer to OT evaluation    Lower Extremity Assessment Lower Extremity Assessment: Generalized weakness    Cervical / Trunk Assessment Cervical / Trunk Assessment: Normal  Communication   Communication: No difficulties  Cognition Arousal/Alertness: Awake/alert Behavior During Therapy: WFL for tasks assessed/performed Overall Cognitive Status: Within Functional Limits for tasks assessed                                          General Comments General comments (skin integrity, edema, etc.): VSS on RA    Exercises     Assessment/Plan    PT Assessment Patient needs continued PT services  PT Problem List Decreased activity tolerance;Decreased balance;Decreased strength;Decreased mobility       PT Treatment Interventions DME instruction;Gait training;Therapeutic activities;Stair training;Functional mobility training;Therapeutic exercise;Balance training;Neuromuscular re-education;Patient/family education    PT Goals (Current goals can be found in the Care Plan section)  Acute Rehab PT Goals Patient Stated Goal: to regain strength and independence PT Goal Formulation: With patient Time For Goal Achievement: 05/02/22 Potential to Achieve Goals: Good Additional Goals Additional Goal #1: Pt will score >19/24 on the DGI to indicate a reduced risk for falls Additional Goal #2: Pt will score >45/56 on the BERG to indicate a reduced risk for falls    Frequency Min 3X/week     Co-evaluation               AM-PAC PT "6 Clicks" Mobility  Outcome Measure Help needed turning from your back to your side while in a  flat bed without using bedrails?: None Help needed moving from lying on your back to sitting on the side of a flat bed without using bedrails?: None Help needed moving to and from a bed to a chair (including a wheelchair)?: A Little Help needed standing up from a chair using your arms (e.g., wheelchair or bedside chair)?: A Little Help needed to walk in hospital room?: A Little Help needed climbing 3-5 steps with a railing? : A Lot 6 Click Score: 19    End of Session   Activity Tolerance: Patient tolerated treatment well Patient left: in bed;with call bell/phone within reach;with family/visitor present Nurse Communication: Mobility status PT Visit Diagnosis: Other abnormalities of gait and mobility (R26.89)    Time: 7416-3845 PT Time Calculation (min) (ACUTE ONLY): 22 min   Charges:   PT Evaluation $PT Eval Low Complexity: [place], PT, DPT Acute Rehabilitation Office [phone]   Zenaida Niece 04/18/2022, 5:10 PM

## 2022-04-19 ENCOUNTER — Inpatient Hospital Stay (HOSPITAL_COMMUNITY): Payer: Medicare PPO

## 2022-04-19 DIAGNOSIS — I880 Nonspecific mesenteric lymphadenitis: Secondary | ICD-10-CM | POA: Diagnosis not present

## 2022-04-19 DIAGNOSIS — E43 Unspecified severe protein-calorie malnutrition: Secondary | ICD-10-CM | POA: Insufficient documentation

## 2022-04-19 LAB — CBC
HCT: 27.9 % — ABNORMAL LOW (ref 36.0–46.0)
Hemoglobin: 9.3 g/dL — ABNORMAL LOW (ref 12.0–15.0)
MCH: 29.6 pg (ref 26.0–34.0)
MCHC: 33.3 g/dL (ref 30.0–36.0)
MCV: 88.9 fL (ref 80.0–100.0)
Platelets: 124 10*3/uL — ABNORMAL LOW (ref 150–400)
RBC: 3.14 MIL/uL — ABNORMAL LOW (ref 3.87–5.11)
RDW: 13.2 % (ref 11.5–15.5)
WBC: 9.1 10*3/uL (ref 4.0–10.5)
nRBC: 0 % (ref 0.0–0.2)

## 2022-04-19 LAB — GLUCOSE, CAPILLARY
Glucose-Capillary: 145 mg/dL — ABNORMAL HIGH (ref 70–99)
Glucose-Capillary: 171 mg/dL — ABNORMAL HIGH (ref 70–99)
Glucose-Capillary: 179 mg/dL — ABNORMAL HIGH (ref 70–99)
Glucose-Capillary: 70 mg/dL (ref 70–99)
Glucose-Capillary: 83 mg/dL (ref 70–99)
Glucose-Capillary: 99 mg/dL (ref 70–99)

## 2022-04-19 LAB — BASIC METABOLIC PANEL
Anion gap: 12 (ref 5–15)
BUN: 68 mg/dL — ABNORMAL HIGH (ref 8–23)
CO2: 25 mmol/L (ref 22–32)
Calcium: 9.2 mg/dL (ref 8.9–10.3)
Chloride: 99 mmol/L (ref 98–111)
Creatinine, Ser: 2.39 mg/dL — ABNORMAL HIGH (ref 0.44–1.00)
GFR, Estimated: 21 mL/min — ABNORMAL LOW (ref >60)
Glucose, Bld: 92 mg/dL (ref 70–99)
Potassium: 4.4 mmol/L (ref 3.5–5.1)
Sodium: 136 mmol/L (ref 135–145)

## 2022-04-19 MED ORDER — EVOLOCUMAB 140 MG/ML ~~LOC~~ SOAJ
140.0000 mg | Freq: Once | SUBCUTANEOUS | Status: AC
  Administered 2022-04-20: 140 mg via SUBCUTANEOUS

## 2022-04-19 MED ORDER — GLUCERNA SHAKE PO LIQD
237.0000 mL | Freq: Three times a day (TID) | ORAL | Status: DC
  Administered 2022-04-22: 237 mL via ORAL

## 2022-04-19 MED ORDER — ADULT MULTIVITAMIN W/MINERALS CH
1.0000 | ORAL_TABLET | Freq: Every day | ORAL | Status: DC
  Filled 2022-04-19: qty 1

## 2022-04-19 MED ORDER — BISACODYL 10 MG RE SUPP
10.0000 mg | Freq: Every day | RECTAL | Status: DC
  Administered 2022-04-19: 10 mg via RECTAL
  Filled 2022-04-19: qty 1

## 2022-04-19 MED ORDER — ISOSORBIDE MONONITRATE ER 30 MG PO TB24: 30.0000 mg | ORAL_TABLET | Freq: Every day | ORAL | Status: DC

## 2022-04-19 MED ORDER — SENNOSIDES-DOCUSATE SODIUM 8.6-50 MG PO TABS
1.0000 | ORAL_TABLET | Freq: Two times a day (BID) | ORAL | Status: DC
  Administered 2022-04-19 – 2022-04-24 (×10): 1 via ORAL
  Filled 2022-04-19 (×10): qty 1

## 2022-04-19 MED ORDER — SODIUM CHLORIDE 0.9 % IV SOLN: INTRAVENOUS | Status: DC

## 2022-04-19 MED ORDER — POLYETHYLENE GLYCOL 3350 17 G PO PACK
17.0000 g | PACK | Freq: Every day | ORAL | Status: DC
  Administered 2022-04-19: 17 g via ORAL
  Filled 2022-04-19: qty 1

## 2022-04-19 MED ORDER — ISOSORBIDE MONONITRATE ER 60 MG PO TB24
120.0000 mg | ORAL_TABLET | Freq: Every day | ORAL | Status: DC
  Administered 2022-04-20 – 2022-04-23 (×4): 120 mg via ORAL
  Filled 2022-04-19 (×4): qty 2

## 2022-04-19 MED ORDER — METOPROLOL SUCCINATE ER 25 MG PO TB24
12.5000 mg | ORAL_TABLET | Freq: Every day | ORAL | Status: DC
  Administered 2022-04-20 – 2022-04-21 (×2): 12.5 mg via ORAL
  Filled 2022-04-19 (×2): qty 1

## 2022-04-19 MED ORDER — PROSOURCE PLUS PO LIQD
30.0000 mL | Freq: Two times a day (BID) | ORAL | Status: DC
  Filled 2022-04-19 (×4): qty 30

## 2022-04-19 NOTE — Inpatient Diabetes Management (Signed)
Inpatient Diabetes Program Recommendations  AACE/ADA: New Consensus Statement on Inpatient Glycemic Control   Target Ranges:  Prepandial:   less than 140 mg/dL      Peak postprandial:   less than 180 mg/dL (1-2 hours)      Critically ill patients:  140 - 180 mg/dL    Latest Reference Range & Units 04/19/22 00:09 04/19/22 04:03 04/19/22 07:43 04/19/22 11:57  Glucose-Capillary 70 - 99 mg/dL 99 83 70 179 (H)    Latest Reference Range & Units 04/18/22 20:21  Glucose-Capillary 70 - 99 mg/dL 68 (L)   Review of Glycemic Control  Diabetes history: DM1 Outpatient Diabetes medications: Medtronic insulin pump with Apidra and Guardian 4 CGM  Current orders for Inpatient glycemic control: Insulin Pump AC&HS and 2am  NOTE: Spoke with patient at bedside regarding insulin pump. Patient reports that she has the  Medtronic 780G insulin pump with Apidra insulin and is also using the Guardian 4 CGM. Patient reports that she is using the Auto Mode on her insulin pump (the pump with automatically adjust basal settings depending on glucose from CGM). Patient confirms that she seen Endocrinology on 04/16/22. Patient reports that her glucose has been running very good over the past few weeks but notes that she has had some hypoglycemia. Discussed current glucose trends and expressed concern with glucose ranging 68-70 mg/dl during the night until this morning. Patient states that she has not been able to eat much at all. Reviewed insulin pump settings which are as follows:  Basal insulin  12A   1.1 units/hour 5A          2.75 units/hour 1:30P     4.0 units/hour 7P          4.6 units/hour Total daily basal insulin: 73.875 units/24 hours  Carb Coverage 12A      1:4 1 unit for every 4 grams of carbohydrates 1A        1:26 1 unit for every 26 grams of carbohydrates 4P        1:20 1 unit for every 20 grams of carbohydrates 9P         3.5      1 units for every 3.5 grams of carbohydrates  Insulin Sensitivity 12A   1:15 1 unit drops blood glucose 15 mg/dl 7A     1:5         1 unit drops blood glucose 5 mg/dl  Target Glucose Goals 12A 120-120 mg/dl (patient changed to 130-130 mg/dl during visit)  Target glucose on insulin pump was set at 120 mg/dl. Patient agreeable to change target glucose to 130 mg/dl on her insulin pump to decrease incident of hypoglycemia. Patient is very knowledgeable about her insulin pump and reports that she is pleased with the new 780 Medtronic insulin pump and Guardian 4 (as to how tight it keeps her glucose).  Patient reports that she has extra pump supplies at bedside if needed. Informed patient that our team will follow along while inpatient. Patient verbalized understanding of information and has no questions at this time. Sent chat message to Dr. Erlinda Hong to make her aware that target glucose was changed to 130 mg/dl on insulin pump.   NURSING: Once insulin pump order set is ordered please print off the Patient insulin pump contract and flow sheet. The insulin pump contract should be signed by the patient and then placed in the chart. The patient insulin pump flow sheet will be completed by the patient at the bedside and  the RN caring for the patient will use the patient's flow sheet to document in the [facility]. RN will need to complete the Nursing Insulin Pump Flowsheet at least once a shift. Patient will need to keep extra insulin pump supplies at the bedside at all times.   Thanks, Barnie Alderman, RN, MSN, [place] Diabetes Coordinator Inpatient Diabetes Program [phone] (Team Pager from 8am to [place])

## 2022-04-19 NOTE — Progress Notes (Signed)
Mobility Specialist - Progress Note   04/19/22 1434  Mobility  Activity Ambulated with assistance to bathroom  Level of Assistance Standby assist, set-up cues, supervision of patient - no hands on  Assistive Device None  Distance Ambulated (ft) 10 ft  Activity Response Tolerated well  $Mobility charge 1 Mobility    Pt received in bed requesting to go to BR. Left in bed w/ call bell in reach and all needs met.   Paulla Dolly Mobility Specialist

## 2022-04-19 NOTE — Progress Notes (Signed)
Initial Nutrition Assessment  DOCUMENTATION CODES:   Severe malnutrition in context of chronic illness  INTERVENTION:  Liberalize diet to regular MVI with minerals daily Prosource Plus BID to provide 100kcal and 20g of protein per packet Glucerna Shake po TID, each supplement provides 220 kcal and 10 grams of protein  NUTRITION DIAGNOSIS:   Severe Malnutrition related to chronic illness (CHF, hx cancer) as evidenced by severe fat depletion, severe muscle depletion.  GOAL:   Patient will meet greater than or equal to 90% of their needs  MONITOR:   PO intake, Labs, I & O's  REASON FOR ASSESSMENT:  Consult  (nutritional goals)  ASSESSMENT:  Pt with hx of atrial fibrillation, hx renal cancer (s/p right nephrectomy), hx breast cancer, CHF, CKD4, CAD, DM type 1, HTN, HLD, NASH, presented to ED with worsening abdominal pain. Found to have mesenteric adenitis.  Pt resting in bed at the time of assessment. Reports that she had a difficult time ordering lunch with all of her restrictions. Pt is a type 1 diabetic with most recent A1c of 7.3%, will liberalize to regular as pt has her pump and is managing her own glucose. Liberalizing diet will improve intake.   Significant fat and muscle store depletions on exam.   Nutritionally Relevant Medications: Scheduled Meds:  docusate sodium  100 mg Oral BID   famotidine  10 mg Oral Daily   febuxostat  40 mg Oral QHS   insulin pump   Subcutaneous TID WC, HS, 0200   polycarbophil  625 mg Oral Daily   pravastatin  10 mg Oral Daily   Continuous Infusions:  sodium chloride 75 mL/hr at 04/19/22 1332   PRN Meds: bisacodyl, ondansetron, polyethylene glycol  Labs Reviewed: BUN 68, creatinine 2.39 CBG ranges from 68-179 mg/dL over the last 24 hours HgbA1c 7.3% (7/12, [facility])  NUTRITION - FOCUSED PHYSICAL EXAM: Flowsheet Row Most Recent Value  Orbital Region Mild depletion  Upper Arm Region Severe depletion  Thoracic and Lumbar Region Severe  depletion  Buccal Region Mild depletion  Temple Region No depletion  Clavicle Bone Region Mild depletion  Clavicle and Acromion Bone Region Severe depletion  Scapular Bone Region Moderate depletion  Dorsal Hand Mild depletion  Patellar Region Severe depletion  Anterior Thigh Region Severe depletion  Posterior Calf Region Severe depletion  Edema (RD Assessment) None  Hair Reviewed  Eyes Reviewed  Mouth Reviewed  Skin Reviewed  Nails Reviewed    Diet Order:   Diet Order             Diet regular Room service appropriate? Yes; Fluid consistency: Thin  Diet effective now                   EDUCATION NEEDS:   Not appropriate for education at this time  Skin:  Skin Assessment: Reviewed RN Assessment  Last BM:  9/22  Height:   Ht Readings from Last 1 Encounters:  04/19/22 5' 2"  (1.575 m)    Weight:   Wt Readings from Last 1 Encounters:  04/19/22 50.6 kg    Ideal Body Weight:  50 kg  BMI:  Body mass index is 20.4 kg/m.  Estimated Nutritional Needs:  Kcal:  1500-1700 kcal/d Protein:  75-90 g/d Fluid:  >/=1.5L    Ranell Patrick, RD, LDN Clinical Dietitian RD pager # available in AMION  After hours/weekend pager # available in [facility]

## 2022-04-19 NOTE — Progress Notes (Signed)
Mobility Specialist - Progress Note   04/19/22 0934  Mobility  Activity Ambulated with assistance in hallway  Level of Assistance Standby assist, set-up cues, supervision of patient - no hands on  Assistive Device None  Distance Ambulated (ft) 200 ft  Activity Response Tolerated well  $Mobility charge 1 Mobility    Pt received in bed agreeable to mobility. Left EOB w/ call bell in reach and all needs met.   Paulla Dolly Mobility Specialist

## 2022-04-19 NOTE — Progress Notes (Signed)
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, right renal cell carcinoma status post right nephrectomy, presented to the hospital due to  feeling nauseous x 2wks, ab pain /feeling bloated x1week, no BM x1week,reports she was started on [place] two weeks ago, she stopped taking [place]  Reports ab started from RUQ, then generalized, then moved to RLQ   Subjective:  Still need prn oxy to help the pain, but oxy " mess up my mind"  Assessment & Plan:  Principal Problem:   Mesenteric adenitis Active Problems:   Hyperlipidemia   Hypertension   Arteriosclerotic cardiovascular disease (ASCVD)   Pacemaker   Chronic diastolic CHF (congestive heart failure) (HCC)   PAF (paroxysmal atrial fibrillation) (HCC)   Insulin dependent type 1 diabetes mellitus ([place])   Obstructive sleep apnea treated with BiPAP   CKD (chronic kidney disease) stage 4, GFR 15-29 ml/min (HCC)   Multiple drug allergies   Protein-calorie malnutrition, severe    AB pain, bloating Possible multifactorial including gastroparesis, symptomatic gallstone, constipation,  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  She is constipated, she also has gallstones , slightly elevated lft Repeat lft, lipase, check lactic acid, treat constipation, prn analgesics Will discuss with GI  Prominent Mesentric lymph nodes mesenteric adenitis? She denies recent h/o vital illness She does has h/o  Hodgkin's lymphoma in the 1980's Will discuss with GI and hem/onc tomorrow   low normal BP Probably from dehydration and analgesics Adjust bp meds with holding parameters  Start gentle hydration, close monitor volume status  Insulin-dependent type 2 diabetes A1c 7.3% 01/2022 CBG ranges from 68-179 mg/dL over the last 24 hours Insulin pump parameters adjusted due to poor oral intake Appreciate diabetes RN input Diet liberalized due to poor oral intake  CAD/history of CABG and multiple stents Continue Brilinta ,beta-blocker, Imdur, statin, Repatha Reports she takes beta-blocker and Imdur at night, order adjusted to reflect this  Chronic diastolic CHF -Currently appear dry -Hold diuretics  Afib -Has pacemaker -Rate controlled with metoprolol -Not on AC due to recurrent GI bleeds -Cannot have Watchman due to nickel allergy  CKDIV H/o right nephrectomy -Renal function appear close to baseline, renal dosing meds  OSA , continue home biap  .  I have Reviewed nursing notes, Vitals, pain scores, I/o's, Lab results and  imaging results since pt's last encounter, details please see discussion above  I ordered the following labs:  Unresulted Labs (From admission, onward)     Start     Ordered   04/20/22 0500  CBC with Differential/Platelet  Tomorrow morning,   R        04/19/22 1619   04/20/22 0500  Comprehensive metabolic panel  Tomorrow morning,   R        04/19/22 1619   04/20/22 0500  Lipase, blood  Tomorrow morning,   R        04/19/22 1619   04/20/22 0500  Magnesium  Tomorrow morning,   R  04/19/22 1619   04/20/22 0500  CK  Tomorrow morning,   R        04/19/22 1637   04/20/22 0500  Lactic acid, plasma  Tomorrow morning,   R        04/19/22 1649             DVT prophylaxis: enoxaparin (LOVENOX) injection 30 mg Start: 04/18/22 0945   Code Status:   Code Status: Full Code  Family Communication: patient Disposition:    Dispo: The patient is from:  home              Anticipated d/c is to: home              Anticipated d/c date is: TBD  Antimicrobials:    Anti-infectives (From admission, onward)    None          Objective: Vitals:   04/19/22 0419 04/19/22 0742 04/19/22 1547 04/19/22 1551  BP: (!) 92/40 (!) 100/52  (!) 110/47  Pulse: 70 66  78  Resp:  16  16  Temp:  98.1 F (36.7 C)  98.2 F (36.8 C)  TempSrc:  Oral  Oral  SpO2: 100% 100%  99%  Weight:   50.6 kg   Height:   5' 2"  (1.575 m)     Intake/Output Summary (Last 24 hours) at 04/19/2022 1912 Last data filed at 04/19/2022 1853 Gross per 24 hour  Intake 400.93 ml  Output --  Net 400.93 ml   Filed Weights   04/17/22 1550 04/19/22 1547  Weight: 50.8 kg 50.6 kg    Examination:  General exam: alert, awake, communicative,calm, NAD Respiratory system: Clear to auscultation. Respiratory effort normal. Cardiovascular system:  RRR.  Gastrointestinal system: mild RLQ tenderness, Abdomen is nondistended, soft and nontender.  Normal bowel sounds heard. Central nervous system: Alert and oriented. No focal neurological deficits. Extremities:  no edema Skin: No rashes, lesions or ulcers Psychiatry: Judgement and insight appear normal. Mood & affect appropriate.     Data Reviewed: I have personally reviewed  labs and visualized  imaging studies since the last encounter and formulate the plan        Scheduled Meds:  [START ON 04/20/2022] (feeding supplement) PROSource Plus  30 mL Oral BID BM   aspirin EC  81 mg Oral QHS   bisacodyl  10 mg Rectal Daily   enoxaparin (LOVENOX) injection  30 mg Subcutaneous Q24H   escitalopram  5 mg Oral Daily   Evolocumab  140 mg Subcutaneous Once   famotidine  10 mg Oral Daily   febuxostat  40 mg Oral QHS   feeding supplement (GLUCERNA SHAKE)  237 mL Oral TID BM   insulin pump   Subcutaneous TID WC, HS, 0200   [START ON 04/20/2022] isosorbide mononitrate  120 mg Oral QHS   latanoprost  1 drop Both Eyes QHS   [START ON  04/20/2022] metoprolol succinate  12.5 mg Oral QHS   polycarbophil  625 mg Oral Daily   polyethylene glycol  17 g Oral Daily   pravastatin  10 mg Oral Daily   rOPINIRole  0.25 mg Oral QHS   senna-docusate  1 tablet Oral BID   sodium chloride flush  3 mL Intravenous Q12H   ticagrelor  60 mg Oral BID   Continuous Infusions:  sodium chloride 75 mL/hr at 04/19/22 1332     LOS: 1 day     Florencia Reasons, MD PhD FACP [facility]  Available via Epic secure  chat 7am-7pm for nonurgent issues Please page for urgent issues To page the attending provider between 7A-7P or the covering provider during after hours 7P-7A, please log into the web site [email or web address] and access using universal [place] password for that web site. If you do not have the password, please call the hospital operator.    04/19/2022, 7:12 PM

## 2022-04-19 NOTE — Progress Notes (Signed)
Remote pacemaker transmission.   

## 2022-04-19 NOTE — Progress Notes (Signed)
Patient has home BIPAP for use and will self place when ready.

## 2022-04-20 ENCOUNTER — Other Ambulatory Visit (HOSPITAL_COMMUNITY): Payer: Self-pay | Admitting: Cardiology

## 2022-04-20 DIAGNOSIS — R935 Abnormal findings on diagnostic imaging of other abdominal regions, including retroperitoneum: Secondary | ICD-10-CM

## 2022-04-20 DIAGNOSIS — I880 Nonspecific mesenteric lymphadenitis: Secondary | ICD-10-CM | POA: Diagnosis not present

## 2022-04-20 DIAGNOSIS — R7989 Other specified abnormal findings of blood chemistry: Secondary | ICD-10-CM

## 2022-04-20 LAB — CBC WITH DIFFERENTIAL/PLATELET
Abs Immature Granulocytes: 0.03 10*3/uL (ref 0.00–0.07)
Basophils Absolute: 0 10*3/uL (ref 0.0–0.1)
Basophils Relative: 1 %
Eosinophils Absolute: 0.2 10*3/uL (ref 0.0–0.5)
Eosinophils Relative: 3 %
HCT: 26.5 % — ABNORMAL LOW (ref 36.0–46.0)
Hemoglobin: 8.8 g/dL — ABNORMAL LOW (ref 12.0–15.0)
Immature Granulocytes: 0 %
Lymphocytes Relative: 9 %
Lymphs Abs: 0.6 10*3/uL — ABNORMAL LOW (ref 0.7–4.0)
MCH: 29.8 pg (ref 26.0–34.0)
MCHC: 33.2 g/dL (ref 30.0–36.0)
MCV: 89.8 fL (ref 80.0–100.0)
Monocytes Absolute: 0.5 10*3/uL (ref 0.1–1.0)
Monocytes Relative: 8 %
Neutro Abs: 5.6 10*3/uL (ref 1.7–7.7)
Neutrophils Relative %: 79 %
Platelets: 118 10*3/uL — ABNORMAL LOW (ref 150–400)
RBC: 2.95 MIL/uL — ABNORMAL LOW (ref 3.87–5.11)
RDW: 13.2 % (ref 11.5–15.5)
WBC: 7 10*3/uL (ref 4.0–10.5)
nRBC: 0 % (ref 0.0–0.2)

## 2022-04-20 LAB — COMPREHENSIVE METABOLIC PANEL
ALT: 31 U/L (ref 0–44)
AST: 28 U/L (ref 15–41)
Albumin: 2.9 g/dL — ABNORMAL LOW (ref 3.5–5.0)
Alkaline Phosphatase: 220 U/L — ABNORMAL HIGH (ref 38–126)
Anion gap: 10 (ref 5–15)
BUN: 65 mg/dL — ABNORMAL HIGH (ref 8–23)
CO2: 24 mmol/L (ref 22–32)
Calcium: 8.4 mg/dL — ABNORMAL LOW (ref 8.9–10.3)
Chloride: 100 mmol/L (ref 98–111)
Creatinine, Ser: 2.4 mg/dL — ABNORMAL HIGH (ref 0.44–1.00)
GFR, Estimated: 21 mL/min — ABNORMAL LOW (ref >60)
Glucose, Bld: 92 mg/dL (ref 70–99)
Potassium: 4.3 mmol/L (ref 3.5–5.1)
Sodium: 134 mmol/L — ABNORMAL LOW (ref 135–145)
Total Bilirubin: 0.8 mg/dL (ref 0.3–1.2)
Total Protein: 7.1 g/dL (ref 6.5–8.1)

## 2022-04-20 LAB — GLUCOSE, CAPILLARY
Glucose-Capillary: 80 mg/dL (ref 70–99)
Glucose-Capillary: 128 mg/dL — ABNORMAL HIGH (ref 70–99)
Glucose-Capillary: 96 mg/dL (ref 70–99)
Glucose-Capillary: 68 mg/dL — ABNORMAL LOW (ref 70–99)
Glucose-Capillary: 206 mg/dL — ABNORMAL HIGH (ref 70–99)

## 2022-04-20 LAB — PREPARE RBC (CROSSMATCH)

## 2022-04-20 LAB — MAGNESIUM: Magnesium: 2.4 mg/dL (ref 1.7–2.4)

## 2022-04-20 LAB — LACTIC ACID, PLASMA: Lactic Acid, Venous: 0.8 mmol/L (ref 0.5–1.9)

## 2022-04-20 LAB — LIPASE, BLOOD: Lipase: 92 U/L — ABNORMAL HIGH (ref 11–51)

## 2022-04-20 LAB — CK: Total CK: 59 U/L (ref 38–234)

## 2022-04-20 MED ORDER — NITROGLYCERIN 0.4 MG SL SUBL: 0.4000 mg | SUBLINGUAL_TABLET | SUBLINGUAL | Status: DC | PRN

## 2022-04-20 MED ORDER — POLYETHYLENE GLYCOL 3350 17 G PO PACK
17.0000 g | PACK | Freq: Two times a day (BID) | ORAL | Status: DC
  Administered 2022-04-20 – 2022-04-24 (×4): 17 g via ORAL
  Filled 2022-04-20 (×8): qty 1

## 2022-04-20 MED ORDER — NITROGLYCERIN 0.4 MG/SPRAY TL SOLN: 1.0000 | Status: DC | PRN

## 2022-04-20 MED ORDER — FUROSEMIDE 10 MG/ML IJ SOLN
40.0000 mg | Freq: Once | INTRAMUSCULAR | Status: AC
  Administered 2022-04-20: 40 mg via INTRAVENOUS
  Filled 2022-04-20: qty 4

## 2022-04-20 MED ORDER — BISACODYL 10 MG RE SUPP
10.0000 mg | Freq: Every day | RECTAL | Status: AC
  Administered 2022-04-20: 10 mg via RECTAL
  Filled 2022-04-20: qty 1

## 2022-04-20 MED ORDER — SODIUM CHLORIDE 0.9% IV SOLUTION: Freq: Once | INTRAVENOUS | Status: AC

## 2022-04-20 NOTE — Progress Notes (Signed)
PROGRESS NOTE    [name] [name]  LTJ:[id number] DOB: 1950/12/08 DOA: 04/17/2022 PCP: [name], MD     Brief Narrative:  H/o OSA on bipap, insulin-dependent diabetes (use insulin pump), gastroparesis, CKD4, CAD status post CABG and multiple stents, Afib, history of multiple surgery including small bowel resection due to lipoma, right renal cell carcinoma status post right nephrectomy, presented to the hospital due to  feeling nauseous x 2wks, ab pain /feeling bloated x1week, no BM x1week,reports she was started on [place] two weeks ago, she stopped taking [place]  Reports ab started from RUQ, then generalized, then moved to RLQ   Subjective:  Report stomach feels about the same, still have intermittent right lower quadrant pain, pain appears worse with movement, not able to tolerate diet due to feeling bloated/fluid not going down for 3 to 4 hours after eating, though she does not feel that this feels like her gastroparesis issues  Report blood in the stool this morning, hemoglobin dropped to 8.8, developed angina while walking in hallway, chest pain resolved after nitro spray.  Lft improved, lipase went up  Assessment & Plan:  Principal Problem:   Mesenteric adenitis Active Problems:   Hyperlipidemia   Hypertension   Arteriosclerotic cardiovascular disease (ASCVD)   Pacemaker   Chronic diastolic CHF (congestive heart failure) (HCC)   PAF (paroxysmal atrial fibrillation) (HCC)   Insulin dependent type 1 diabetes mellitus (HCC)   Obstructive sleep apnea treated with BiPAP   CKD (chronic kidney disease) stage 4, GFR 15-29 ml/min (HCC)   Multiple drug allergies   Protein-calorie malnutrition, severe  Normocytic anemia Reports blood in stool x1 this am Hgb dropped to 8.8 from 11.7, with angina symptom this am Will transfuse prbcx1 GI consulted   AB pain, bloating Possible multifactorial including gastroparesis, symptomatic gallstone, constipation,  H/o bowel  resection for small bowel lipoma in her 38's H/o gastroparesis, was on total liquid diet for three months, diet was gradually advanced over several months She does not feel her current symptom is from gastoparesis She feels the symptom reminds her prior ischemic bowel but less severe , lactic acid wnl She is constipated, she also has gallstones , slightly elevated lft on presentation has normalized, but lipase went up treat constipation, prn analgesics GI consulted   Prominent Mesentric lymph nodes mesenteric adenitis? She denies recent h/o vital illness She does has h/o Hodgkin's lymphoma in the 1980's Will discuss with GI and hem/onc tomorrow   low normal BP Probably from dehydration and analgesics Adjust bp meds with holding parameters Received gentle hydration, close monitor volume status  Insulin-dependent type 2 diabetes A1c 7.3% 01/2022 CBG ranges from 68-179 mg/dL over the last 24 hours Insulin pump parameters adjusted due to poor oral intake Appreciate diabetes RN input Diet liberalized due to poor oral intake  CAD/history of CABG and multiple stents Continue Brilinta ,beta-blocker, Imdur, statin, Repatha Reports she takes beta-blocker and Imdur at night, order adjusted to reflect this  Chronic diastolic CHF -Currently appear dry -Hold diuretics , prn for now  Afib -Has pacemaker -Rate controlled with metoprolol -Not on AC due to recurrent GI bleeds -Cannot have Watchman due to nickel allergy  CKDIV H/o right nephrectomy -Renal function appear close to baseline, renal dosing meds  OSA , continue home biap  .  I have Reviewed nursing notes, Vitals, pain scores, I/o's, Lab results and  imaging results since pt's last encounter, details please see discussion above  I ordered the following labs:  Unresulted Labs (From  admission, onward)     Start     Ordered   04/21/22 0500  CBC  Daily at 5am,   R      04/20/22 1425   04/21/22 0500  Comprehensive metabolic  panel  Tomorrow morning,   R        04/20/22 1425   04/21/22 0500  Lipase, blood  Tomorrow morning,   R        04/20/22 1425   04/20/22 1409  Type and screen [place]  Once,   R       Comments: [place]    04/20/22 1408   04/20/22 1408  Prepare RBC (crossmatch)  (Adult Blood Administration - Red Blood Cells)  Once,   R       Question Answer Comment  # of Units 1 unit   Transfusion Indications Symptomatic Anemia   Number of Units to Keep Ahead NO units ahead   If emergent release call blood bank [facility] [phone]      04/20/22 1408   Unscheduled  Occult blood card to lab, stool  As needed,   R      04/20/22 1418             DVT prophylaxis: enoxaparin (LOVENOX) injection 30 mg Start: 04/18/22 0945   Code Status:   Code Status: Full Code  Family Communication: son at bedside  Disposition:    Dispo: The patient is from: home              Anticipated d/c is to: home              Anticipated d/c date is: TBD  Antimicrobials:    Anti-infectives (From admission, onward)    None          Objective: Vitals:   04/19/22 2205 04/20/22 0423 04/20/22 0836 04/20/22 1357  BP: (!) 99/42 (!) 98/53 (!) 109/45 (!) 139/39  Pulse: 83 75 77 82  Resp: 18 16 16 17   Temp: 99 F (37.2 C) 98.1 F (36.7 C) 98.9 F (37.2 C) 99.2 F (37.3 C)  TempSrc: Oral  Oral Oral  SpO2: 100% 97% 100% 100%  Weight:      Height:        Intake/Output Summary (Last 24 hours) at 04/20/2022 1425 Last data filed at 04/20/2022 0838 Gross per 24 hour  Intake 2349.68 ml  Output --  Net 2349.68 ml   Filed Weights   04/17/22 1550 04/19/22 1547  Weight: 50.8 kg 50.6 kg    Examination:  General exam: alert, awake, communicative,calm, NAD Respiratory system: Clear to auscultation. Respiratory effort normal. Cardiovascular system:  RRR.  Gastrointestinal system: mild RLQ tenderness, Abdomen is nondistended, soft and nontender.  Normal bowel sounds  heard. Central nervous system: Alert and oriented. No focal neurological deficits. Extremities:  no edema Skin: No rashes, lesions or ulcers Psychiatry: Judgement and insight appear normal. Mood & affect appropriate.     Data Reviewed: I have personally reviewed  labs and visualized  imaging studies since the last encounter and formulate the plan        Scheduled Meds:  (feeding supplement) PROSource Plus  30 mL Oral BID BM   sodium chloride   Intravenous Once   aspirin EC  81 mg Oral QHS   enoxaparin (LOVENOX) injection  30 mg Subcutaneous Q24H   escitalopram  5 mg Oral Daily   Evolocumab  140 mg Subcutaneous Once   famotidine  10  mg Oral Daily   febuxostat  40 mg Oral QHS   feeding supplement (GLUCERNA SHAKE)  237 mL Oral TID BM   furosemide  40 mg Intravenous Once   insulin pump   Subcutaneous TID WC, HS, 0200   isosorbide mononitrate  120 mg Oral QHS   latanoprost  1 drop Both Eyes QHS   metoprolol succinate  12.5 mg Oral QHS   polycarbophil  625 mg Oral Daily   polyethylene glycol  17 g Oral BID   pravastatin  10 mg Oral Daily   rOPINIRole  0.25 mg Oral QHS   senna-docusate  1 tablet Oral BID   sodium chloride flush  3 mL Intravenous Q12H   ticagrelor  60 mg Oral BID   Continuous Infusions:     LOS: 2 days     Florencia Reasons, MD PhD FACP [facility]  Available via Epic secure chat 7am-7pm for nonurgent issues Please page for urgent issues To page the attending provider between 7A-7P or the covering provider during after hours 7P-7A, please log into the web site [email or web address] and access using universal [place] password for that web site. If you do not have the password, please call the hospital operator.    04/20/2022, 2:25 PM

## 2022-04-20 NOTE — Progress Notes (Signed)
Mobility Specialist Progress Note:   04/20/22 1512  Mobility  Activity Refused mobility   Pt refused mobility d/t feeling faint on her last walk. Will f/u as able.    [name] [name] Mobility Specialist-Acute Rehab Secure Chat only

## 2022-04-20 NOTE — Plan of Care (Signed)
  Problem: Clinical Measurements: Goal: Diagnostic test results will improve Outcome: Progressing   Problem: Clinical Measurements: Goal: Will remain free from infection Outcome: Progressing   Problem: Clinical Measurements: Goal: Ability to maintain clinical measurements within normal limits will improve Outcome: Progressing

## 2022-04-20 NOTE — TOC Initial Note (Signed)
Transition of Care [facility]) - Initial/Assessment Note    Patient Details  Name: [name] [name] MRN: [id number] Date of Birth: [birth year]  Transition of Care [facility]) CM/SW Contact:    Marilu Favre, RN Phone Number: 04/20/2022, 10:25 AM  Clinical Narrative:                 Spoke to patient at bedside. She is from home with husband.   Patient has a BiPAP ( currently at bedside). Patient has a sleep study scheduled for Sunday night. Today is last business day . She would like to know if she should reschedule it or not. Sent attending secure chat.   PCP [name].     Transition of Care Department [facility]) has reviewed patient and no TOC needs have been identified at this time. We will continue to monitor patient advancement through interdisciplinary progression rounds. If new patient transition needs arise, please place a TOC consult.     Expected Discharge Plan: Home/Self Care Barriers to Discharge: Continued Medical Work up   Patient Goals and CMS Choice Patient states their goals for this hospitalization and ongoing recovery are:: to return to home      Expected Discharge Plan and Services Expected Discharge Plan: Home/Self Care   Discharge Planning Services: CM Consult   Living arrangements for the past 2 months: Single Family Home                 DME Arranged: N/A DME Agency: NA       HH Arranged: NA          Prior Living Arrangements/Services Living arrangements for the past 2 months: Single Family Home Lives with:: Spouse Patient language and need for interpreter reviewed:: Yes Do you feel safe going back to the place where you live?: Yes      Need for Family Participation in Patient Care: Yes (Comment) Care giver support system in place?: Yes (comment) Current home services: DME Criminal Activity/Legal Involvement Pertinent to Current Situation/Hospitalization: No - Comment as needed  Activities of Daily Living      Permission Sought/Granted    Permission granted to share information with : No              Emotional Assessment Appearance:: Appears stated age Attitude/Demeanor/Rapport: Engaged Affect (typically observed): Accepting Orientation: : Oriented to Self, Oriented to Place, Oriented to  Time, Oriented to Situation Alcohol / Substance Use: Not Applicable Psych Involvement: No (comment)  Admission diagnosis:  Mesenteric adenitis [I88.0] Calculus of gallbladder without cholecystitis without obstruction [K80.20] Chronic kidney disease, unspecified CKD stage [N18.9] Patient Active Problem List   Diagnosis Date Noted   Protein-calorie malnutrition, severe 04/19/2022   Mesenteric adenitis 04/18/2022   CKD (chronic kidney disease) stage 4, GFR 15-29 ml/min (HCC) 04/18/2022   Multiple drug allergies 04/18/2022   Pain in right hip 12/11/2021   Traumatic hematoma of right lower leg 06/12/2019   Right hip pain 04/14/2019   Obstructive sleep apnea treated with BiPAP 04/24/2017   Genetic testing 01/14/2017   Insulin pump status 09/12/2016   S/P left mastectomy 07/29/2016   History of breast cancer 03/29/2016   Dense breast 12/11/2015   Chronic anticoagulation 12/11/2015   Hx of radiation therapy 04/23/2015   Thrombocytopenia ([place]) 04/23/2015   Iron deficiency anemia secondary to blood loss (chronic) 04/23/2015   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   Radiation therapy complication 50/03/3817   History of nodular sclerosis Hodgkin's disease 10/27/2014   Insulin dependent  type 1 diabetes mellitus ([place]) 10/07/2014   Claudication ([place]) 07/29/2014   PAF (paroxysmal atrial fibrillation) ([place])    Coronary artery disease involving native coronary artery of native heart with unstable angina pectoris ([place])    Unstable angina ([place]) 04/11/2013   Chronic diastolic CHF (congestive heart failure) ([place]) 04/03/2013   Artificial cardiac pacemaker 03/12/2013   Chronic kidney disease (CKD), stage III (moderate) (HCC)  03/12/2013   Big thyroid 03/12/2013   Encounter for fitting or adjustment of insulin pump 03/12/2013   Congestive heart failure ([place]) 03/12/2013   Lipoma of neck 09/23/2012   Pacemaker 09/08/2012   Atrial flutter ([place]) 07/23/2012   Sick sinus syndrome ([place]) 07/21/2012   Gastroparesis due to DM ([place]) 05/01/2012   Hypercalcemia    Cholelithiasis 07/20/2011   Aortic stenosis status post 19 mm Edwards pericardial valve replacement 2011    Arteriosclerotic cardiovascular disease (ASCVD)    Osteopenia    NASH (nonalcoholic steatohepatitis)    Renal cell carcinoma (HCC)     Class: History of   Duodenal ulcer     Class: History of   Ischemic colitis     Class: History of   Gastroesophageal reflux disease    Hypertension    Small bowel obstruction ([place])    Hyperlipidemia 04/11/2010   Colonic polyps, adenomatous 09/29/2009   PCP:  [name], MD Pharmacy:   [facility], [address] [place] [address] Phone: [phone] Fax: [place], [address] [place] [address] Phone: [phone] Fax: [phone]     Social Determinants of Health (SDOH) Interventions    Readmission Risk Interventions     No data to display

## 2022-04-20 NOTE — Plan of Care (Signed)
  Problem: Clinical Measurements: Goal: Ability to maintain clinical measurements within normal limits will improve Outcome: Progressing   Problem: Clinical Measurements: Goal: Will remain free from infection Outcome: Progressing   Problem: Clinical Measurements: Goal: Diagnostic test results will improve Outcome: Progressing   Problem: Clinical Measurements: Goal: Cardiovascular complication will be avoided Outcome: Progressing

## 2022-04-20 NOTE — Progress Notes (Signed)
Occupational Therapy Treatment Patient Details Name: [name] [name] MRN: [id number] DOB: 12/04/1950 Today's Date: 04/20/2022   History of present illness Patient is a 71 yo female presenting to the ED with generalized abdominal pain, nausea, and no BM in 4 days on 04/17/22. Admitted same day with above symptoms. CT findings with mesenteric adenitis. PMH includes: aortic stenosis, CHF, chronic kidney disease, diabetes, CAD   OT comments  Pt. Seen for skilled OT treatment session.  Reports she has been ambulating to/from b.room with family assist in the room.  Declines offer/attempt to during todays session.  Reluctant for oob for pain management and pending suppository placement.  Bed level session for introduction and review of energy conservation strategies.  Handouts provided. Pt. Receptive to education and plans to implement.      Recommendations for follow up therapy are one component of a multi-disciplinary discharge planning process, led by the attending physician.  Recommendations may be updated based on patient status, additional functional criteria and insurance authorization.    Follow Up Recommendations  No OT follow up    Assistance Recommended at Discharge PRN  Patient can return home with the following  Assist for transportation;Help with stairs or ramp for entrance;Assistance with cooking/housework   Equipment Recommendations  None recommended by OT    Recommendations for Other Services      Precautions / Restrictions Precautions Precautions: Fall       Mobility Bed Mobility               General bed mobility comments: pt. opted for bed level pending sipository placement    Transfers                         Balance                                           ADL either performed or assessed with clinical judgement   ADL Overall ADL's : Needs assistance/impaired                                        General ADL Comments: pt. reports she has been ambulating to/from b.room with her son (who was present but sleeping) declined need for further demo/review.  states "as long as i sit still i dont hurt" appears reluctant to get oob stating she had already "completed a long walk this am".  remained bed level for energy conservation review/education.  handouts provided    Extremity/Trunk Assessment              Vision       Perception     Praxis      Cognition Arousal/Alertness: Awake/alert Behavior During Therapy: WFL for tasks assessed/performed Overall Cognitive Status: Within Functional Limits for tasks assessed                                          Exercises      Shoulder Instructions       General Comments      Pertinent Vitals/ Pain       Pain Assessment Pain Assessment: Faces Faces Pain Scale: Hurts a little bit Pain Location:  abdomen Pain Descriptors / Indicators: Aching Pain Intervention(s): Monitored during session  Home Living                                          Prior Functioning/Environment              Frequency  Min 2X/week        Progress Toward Goals  OT Goals(current goals can now be found in the care plan section)  Progress towards OT goals: Progressing toward goals     Plan Discharge plan remains appropriate    Co-evaluation                 AM-PAC OT "6 Clicks" Daily Activity     Outcome Measure   Help from another person eating meals?: A Little Help from another person taking care of personal grooming?: A Little Help from another person toileting, which includes using toliet, bedpan, or urinal?: A Little Help from another person bathing (including washing, rinsing, drying)?: A Little Help from another person to put on and taking off regular upper body clothing?: A Little Help from another person to put on and taking off regular lower body clothing?: A Little 6 Click Score:  18    End of Session    OT Visit Diagnosis: Unsteadiness on feet (R26.81);Muscle weakness (generalized) (M62.81);Pain   Activity Tolerance Patient tolerated treatment well   Patient Left in bed;with call bell/phone within reach;Other (comment) (PT in room meeting with pt.)   Nurse Communication          Time: [phone] OT Time Calculation (min): 16 min  Charges: OT General Charges $OT Visit: 1 Visit OT Treatments $Self Care/Home Management : 8-22 mins  Sonia Baller, COTA/L Acute Rehabilitation [phone]   Clearnce Sorrel [place]-COTA/L 04/20/2022, 12:42 PM

## 2022-04-20 NOTE — Progress Notes (Signed)
PT Cancellation Note  Patient Details Name: WAYNE BRUNKER MRN: [id number] DOB: 1950-11-08   Cancelled Treatment:    Reason Eval/Treat Not Completed: Other (comment) (Refused due to got suppository. will return as able.)   Alvira Philips 04/20/2022, 12:02 PM Elwyn Lowden M,PT [place] [phone]

## 2022-04-20 NOTE — Consult Note (Signed)
Consultation  Referring Provider: TRh/ Erlinda Hong fang Primary Care Physician:  [name], MD Primary Gastroenterologist:  Dr.Koch -[facility], Dr. Elie Confer -[facility]  Reason for Consultation: 1 week history of progressive abdominal pain, nausea malaise abdominal bloating obstipation  HPI: [name] [name] is a 71 y.o. female  with numerous medical problems, who was admitted on 04/18/2022 with progressive complaints of abdominal pain which had started about a week previous.  She says initially she felt sort of bloated and uncomfortable and then developed rather generalized abdominal discomfort which was not severe She had some associated queasiness but no fever or chills, no diarrhea By Monday 9/25 she says her pain got sharper and seem to be more located below her ventral hernia in the right mid abdomen.  She says that she would feel fine if she would sit still however with attempts to move turn over in bed, walk, she would get a sharp pain in that area.  By the time of admission she had not had a bowel movement in about a week. She says she was able to eat very small amounts of food at home but would feel full and uncomfortable. Only new medication was a medication called Carrington Clamp for congestive heart failure chronic kidney disease.  She stopped this about 5 days ago and did not have any improvement in her symptoms. CT of the abdomen pelvis was done on 04/17/2022 without contrast and showed gallstones, prior right nephrectomy, and hysterectomy, there is some haziness within the mesenteric root new from prior imaging raising possibility of mesenteric adenitis. Upper abdominal ultrasound 04/18/2022 shows multiple gallstones, no ductal dilation no wall thickening or pericholecystic fluid, hepatic echogenicity within normal limits  Patient has been given several laxatives since admission - having urgency for bowel movement currently   Labs today WBC 7.0/hemoglobin 8.8/hematocrit 26.2 down from hemoglobin 11.22  days ago BUN 65/creatinine 2.4 Albumin 2.4 LFTs normal with exception of alk phos 220 Lipase 92 lactate within normal limits  Patient relates that she sees Dr. Elie Confer at [facility] for NASH and most recently had GI care with Dr. Derrill Kay at [facility] with colonoscopy in 2021. She had finding of a small tubular adenomatous colon polyp. Also had enteroscopy in August 2021 with biopsy of a jejunal nodule.  Path shows normal small bowel mucosa with no diagnostic abnormality.  Medical problems include prior history of renal cell CA status post right nephrectomy, prior history of breast cancer, history of Hodgkin's disease very remote, atrial fibrillation, chronic kidney disease stage IV, coronary artery disease status post CABG, prior aortic valve replacement, sleep apnea with BiPAP use, NASH and hypertension.  Status post pacemaker. She is maintained on Brilinta and aspirin at home.  Past Medical History:  Diagnosis Date   Anxiety    Aortic stenosis    a. bicuspid aortic valve; mean gradient of 20 mmHg in 2/10; b. s/p bioprosth AVR (19-mm Edwards pericardial valve) with a redo coronary artery bypass graft procedure in 07/2009; postoperative Dressler's syndrome     Arthritis    Breast cancer ([place])    Carcinoma, renal cell ([place]) 05/2007   Laparoscopic right nephrectomy   Chronic diastolic CHF (congestive heart failure) ([place])    a. 9.2014 EchoP EF 60-65%, no rwma, bioprosth AVR, mean gradient of 73mHg, mild to mod MR, PASP 549mg.   CKD (chronic kidney disease), stage IV (HCC)    [place], [place]   Coronary artery disease    a. initial CABG with SVG-RCA in 12/05. b. redo  SVG-RCA and bioprosthetic AVR in 1/11. d. 9/14 - PCI with DES to distal LM/proximal LAD was done rather than bypass as she would have been a poor candidate for redo sternotomy. d. S/p DES to prox LM 05/2014.   Diabetes mellitus 03/2010    TYPE II: Hemoglobin A1c of 7.4 in  03/2010; 8.4 in 06/2010; treated with insulin pump    Duodenal ulcer    Remote; H. pylori positive   Gastroparesis due to DM ([place]) 05/01/2012   Glaucoma 1998   Hodgkin's disease ([place]) 1991   M[place]le radiation therapy   Hyperlipidemia    Hyperplastic gastric polyp 06/23/2012   Hypertension    Iron deficiency anemia    a. 03/2013 EGD: essentially normal. Possible slow GIB.   Migraines    NASH (nonalcoholic steatohepatitis) 1999   -biopsy in 1999   OSA treated with BiPAP    Osteoporosis    Pacemaker    six sinus syndrome   PAF (paroxysmal atrial fibrillation) ([place])    a.  h/o brief episodes noted on ppm interrogation. b. given GI bleeding and need to be on both ASA 81 and Brilinta, risks likely outweigh benefits of anticoagulation.    Small bowel obstruction (HCC)    Recurrent; resolved after resection of a lipoma   Syncope    a. H/o recurrent syncope with pauses on loop recorder s/p Medtronic pacemaker 07/2012.    Past Surgical History:  Procedure Laterality Date   ABDOMINAL HYSTERECTOMY  2000   AORTIC VALVE REPLACEMENT  2011   Aortic valve replacement surgery with a 19 mm bioprosthetic device post redo CABG January 2011.   BONE MARROW BIOPSY     BREAST EXCISIONAL BIOPSY     benign, bilateral   CARDIAC CATHETERIZATION     CERVICAL BIOPSY     COLONOSCOPY     Multiple with adenomatous polyps   COLONOSCOPY  06/23/2012   Procedure: COLONOSCOPY;  Surgeon: Gatha Mayer, MD;  Location: [facility] ENDOSCOPY;  Service: Endoscopy;  Laterality: N/A;   CORONARY ARTERY BYPASS GRAFT  2005, 2011   CABG-most recent SVG to RCA-12/05; RCA occluded with patent graft in 2006; Redo bypass in 07/2009   ESOPHAGOGASTRODUODENOSCOPY     ESOPHAGOGASTRODUODENOSCOPY  06/23/2012   Procedure: ESOPHAGOGASTRODUODENOSCOPY (EGD);  Surgeon: Gatha Mayer, MD;  Location: [facility];  Service: Endoscopy;  Laterality: N/A;   ESOPHAGOGASTRODUODENOSCOPY N/A 04/06/2013   Procedure: ESOPHAGOGASTRODUODENOSCOPY (EGD);  Surgeon: Irene Shipper, MD;  Location: [facility] ENDOSCOPY;   Service: Endoscopy;  Laterality: N/A;   EXPLORATORY LAPAROTOMY W/ BOWEL RESECTION     Small bowel resection of lipoma   GIVENS CAPSULE STUDY N/A 04/30/2013   Procedure: GIVENS CAPSULE STUDY;  Surgeon: Gatha Mayer, MD;  Location: [place];  Service: Endoscopy;  Laterality: N/A;   LEFT AND RIGHT HEART CATHETERIZATION WITH CORONARY ANGIOGRAM N/A 04/07/2013   Procedure: LEFT AND RIGHT HEART CATHETERIZATION WITH CORONARY ANGIOGRAM;  Surgeon: Larey Dresser, MD;  [facility] C[facility] LAB;  Service: Cardiovascular;  Laterality: N/A;   LEFT ATRIAL APPENDAGE OCCLUSION N/A 02/16/2016   Watchman not placed - nickel allergy   LEFT HEART CATHETERIZATION WITH CORONARY/GRAFT ANGIOGRAM N/A 06/07/2014   Procedure: LEFT HEART CATHETERIZATION WITH Beatrix Fetters;  Surgeon: Burnell Blanks, MD;  Location: [facility] CATH LAB;  Service: Cardiovascular;  Laterality: N/A;   LIVER BIOPSY     LOOP RECORDER IMPLANT N/A 12/10/2011   Procedure: LOOP RECORDER IMPLANT;  Surgeon: Evans Lance, MD;  Location: [facility] CATH LAB;  Service: Cardiovascular;  Laterality: N/A;   MALONEY DILATION  06/23/2012   Procedure: [name] DILATION;  Surgeon: Gatha Mayer, MD;  Location: [facility] ENDOSCOPY;  Service: Endoscopy;;  27 fr   MASTECTOMY Left 05/17/2015   total    NEPHRECTOMY  2008   Laparoscopic right nephrectomy, renal cell carcinoma   PACEMAKER INSERTION  08/21/2012   Medtronic Adapta L dual-chamber pacemaker   PERCUTANEOUS CORONARY STENT INTERVENTION (PCI-S) N/A 04/09/2013   Procedure: PERCUTANEOUS CORONARY STENT INTERVENTION (PCI-S);  Surgeon: Burnell Blanks, MD;  Location: [facility] CATH LAB;  Service: Cardiovascular;  Laterality: N/A;   PERMANENT PACEMAKER INSERTION N/A 08/21/2012   Procedure: PERMANENT PACEMAKER INSERTION;  Surgeon: Evans Lance, MD;  Location: [facility] CATH LAB;  Service: Cardiovascular;  Laterality: N/A;   RIGHT HEART CATH N/A 03/18/2019   Procedure: RIGHT HEART CATH;  Surgeon: Larey Dresser,  MD;  Location: [place] CV LAB;  Service: Cardiovascular;  Laterality: N/A;   RIGHT HEART CATH AND CORONARY/GRAFT ANGIOGRAPHY N/A 04/19/2017   Procedure: RIGHT HEART CATH AND CORONARY/GRAFT ANGIOGRAPHY;  Surgeon: Larey Dresser, MD;  Location: [place] CV LAB;  Service: Cardiovascular;  Laterality: N/A;   TEE WITHOUT CARDIOVERSION N/A 12/07/2015   Procedure: TRANSESOPHAGEAL ECHOCARDIOGRAM (TEE);  Surgeon: Larey Dresser, MD;  Location: [place];  Service: Cardiovascular;  Laterality: N/A;   TOTAL ABDOMINAL HYSTERECTOMY W/ BILATERAL SALPINGOOPHORECTOMY  2000   endometriosis and ovarian cysts   TOTAL MASTECTOMY Left 05/17/2015   Procedure: LEFT TOTAL MASTECTOMY;  Surgeon: Rolm Bookbinder, MD;  Location: [facility];  Service: General;  Laterality: Left;   TUMOR EXCISION  1981   removal of Hodgkins lymphoma    Prior to Admission medications   Medication Sig Start Date End Date Taking? Authorizing Provider  acyclovir ointment (ZOVIRAX) 5 % Apply 1 application topically every 3 (three) hours as needed (fever blister/cold sores).   Yes [provider]  ALPRAZolam (XANAX) 0.5 MG tablet Take 1 tablet (0.5 mg total) by mouth 2 (two) times daily as needed for anxiety. 01/15/18  Yes Hagler, Apolonio Schneiders, MD  APIDRA 100 UNIT/ML injection Inject 40 Units into the skin as directed. INFUSE THROUGH INSULIN PUMP UTD, Bolus depends on carb intake 02/04/15  Yes [provider]  aspirin EC 81 MG tablet Take 81 mg by mouth at bedtime.   Yes [provider]  BRILINTA 60 MG TABS tablet TAKE ONE TABLET (60MG TOTAL) BY MOUTH TWICE DAILY Patient taking differently: Take 60 mg by mouth 2 (two) times daily. 12/13/21  Yes Larey Dresser, MD  cetirizine (ZYRTEC) 10 MG tablet Take 10 mg by mouth daily.   Yes [provider]  clobetasol (OLUX) 0.05 % topical foam Apply 1 application topically 2 (two) times daily as needed (skin flaking).   Yes [provider]  Coenzyme Q10 (CO Q  10) 100 MG CAPS Take 100 mg by mouth at bedtime.    Yes [provider]  colchicine 0.6 MG tablet Take 0.6 mg by mouth daily as needed (gout flare).   Yes [provider]  denosumab (PROLIA) 60 MG/ML SOSY injection Inject 60 mg into the skin every 6 (six) months.   Yes [provider]  diclofenac Sodium (VOLTAREN) 1 % GEL Apply 2 g topically 4 (four) times daily.   Yes [provider]  docusate sodium (COLACE) 100 MG capsule Take 100 mg by mouth as needed for mild constipation.   Yes [provider]  escitalopram (LEXAPRO) 5 MG tablet Take 5 mg by mouth  daily.   Yes [provider]  famotidine (PEPCID) 40 MG tablet Take 40 mg by mouth daily. Takes a second tablet at night if needed.   Yes [provider]  glucagon (GLUCAGON EMERGENCY) 1 MG injection Inject 1 mg into the vein once as needed (low blood sugar).   Yes [provider]  isosorbide mononitrate (IMDUR) 120 MG 24 hr tablet TAKE ONE TABLET (120MG TOTAL) BY MOUTH DAILY Patient taking differently: Take 120 mg by mouth daily. 01/10/22  Yes Larey Dresser, MD  latanoprost (XALATAN) 0.005 % ophthalmic solution Place 1 drop into both eyes at bedtime.   Yes [provider]  metolazone (ZAROXOLYN) 2.5 MG tablet Take 2.5 mg (1 tab) once as directed by CHF clinic. Call before taking [phone] 05/31/21  Yes Larey Dresser, MD  metoprolol succinate (TOPROL XL) 25 MG 24 hr tablet Take 1 tablet (25 mg total) by mouth daily. 12/28/21  Yes Larey Dresser, MD  mometasone (NASONEX) 50 MCG/ACT nasal spray Place 2 sprays into the nose daily as needed (allergy). 03/10/20 05/30/22 Yes [provider]  mupirocin cream (BACTROBAN) 2 % Apply 1 application topically 2 (two) times daily as needed. Patient taking differently: Apply 1 application  topically 2 (two) times daily as needed (pain). 02/11/18  Yes Hagler, Apolonio Schneiders, MD  nitroGLYCERIN (NITROLINGUAL) 0.4 MG/SPRAY spray PLACE  1 SPRAY UNDER THE TONGUE EVERY 5 MINUTES FOR 3 DOSES AS NEEDED FOR CHEST PAIN. Patient taking differently: Place 1 spray under the tongue every 5 (five) minutes x 3 doses as needed for chest pain. 07/21/21  Yes Larey Dresser, MD  polyethylene glycol (MIRALAX / GLYCOLAX) 17 g packet Take 17 g by mouth as needed for mild constipation.   Yes [provider]  pravastatin (PRAVACHOL) 10 MG tablet TAKE ONE TABLET (10 MG TOTAL) BY MOUTH DAILY. Patient taking differently: Take 10 mg by mouth daily. 04/02/22  Yes Larey Dresser, MD  REPATHA SURECLICK 845 MG/ML SOAJ INJECT 140MG INTO THE SKIN EVERY 14 DAYS Patient taking differently: Inject 140 mg into the skin every 14 (fourteen) days. 02/26/22  Yes Larey Dresser, MD  rOPINIRole (REQUIP) 0.25 MG tablet TAKE ONE TABLET (0.25MG TOTAL) BY MOUTH DAILY. TAKE TWO HOURS PRIOR TO BEDTIME EVERY NIGHT. Patient taking differently: Take 0.25 mg by mouth at bedtime. Two hours before bedtime 10/16/21  Yes Turner, Traci R, MD  silver nitrate applicators 36-46 % applicator Use as needed to stop superficial skin bleeding. Patient taking differently: Apply 1 Stick topically as needed for bleeding. Use as needed to stop superficial skin bleeding. 07/26/18  Yes Vanessa Kick, MD  torsemide (DEMADEX) 20 MG tablet Take 2 tablets by mouth daily alternating with 68m daily. Patient taking differently: Take 20-40 mg by mouth See admin instructions. Take 2 tablets by mouth daily alternating with 20 mg 04/12/22  Yes MLarey Dresser MD  ULORIC 40 MG tablet Take 1 tablet (40 mg total) by mouth at bedtime. 11/29/17  Yes Hagler, RApolonio Schneiders MD  VASCEPA 1 g capsule TAKE TWO CAPSULES (2 GRAMS TOTAL) BY MOUTH TWO TIMES DAILY Patient taking differently: Take 2 g by mouth 2 (two) times daily. 02/20/22  Yes MLarey Dresser MD  Wheat Dextrin (BENEFIBER) POWD Take 1 Dose by mouth daily.    Yes [provider]  Finerenone (KERENDIA) 10 MG TABS Take 10 mg by mouth daily. Patient  not taking: Reported on 04/18/2022 03/30/22   MLarey Dresser MD    Current Facility-Administered  Medications  Medication Dose Route Frequency Provider Last Rate Last Admin   (feeding supplement) PROSource Plus liquid 30 mL  30 mL Oral BID BM Florencia Reasons, MD       acetaminophen (TYLENOL) tablet 650 mg  650 mg Oral Q6H PRN Karmen Bongo, MD       Or   acetaminophen (TYLENOL) suppository 650 mg  650 mg Rectal Q6H PRN Karmen Bongo, MD       ALPRAZolam Duanne Moron) tablet 0.5 mg  0.5 mg Oral BID PRN Karmen Bongo, MD       aspirin EC tablet 81 mg  81 mg Oral Ivery Quale, MD   81 mg at 04/19/22 2132   bisacodyl (DULCOLAX) EC tablet 5 mg  5 mg Oral Daily PRN Karmen Bongo, MD       enoxaparin (LOVENOX) injection 30 mg  30 mg Subcutaneous Q24H Karmen Bongo, MD   30 mg at 04/20/22 0849   escitalopram (LEXAPRO) tablet 5 mg  5 mg Oral Daily Karmen Bongo, MD   5 mg at 04/20/22 1020   Evolocumab SOAJ 140 mg  140 mg Subcutaneous Once Reome, Earle J, RPH       famotidine (PEPCID) tablet 10 mg  10 mg Oral Daily Reome, Earle J, RPH   10 mg at 04/20/22 1019   febuxostat (ULORIC) tablet 40 mg  40 mg Oral Ivery Quale, MD   40 mg at 04/19/22 2132   feeding supplement (GLUCERNA SHAKE) (GLUCERNA SHAKE) liquid 237 mL  237 mL Oral TID BM Florencia Reasons, MD       fentaNYL (SUBLIMAZE) injection 25 mcg  25 mcg Intravenous Q2H PRN Karmen Bongo, MD       hydrALAZINE (APRESOLINE) injection 5 mg  5 mg Intravenous Q4H PRN Karmen Bongo, MD       insulin pump   Subcutaneous TID WC, HS, 0200 Karmen Bongo, MD   5 each at 04/20/22 0845   isosorbide mononitrate (IMDUR) 24 hr tablet 120 mg  120 mg Oral QHS Florencia Reasons, MD       latanoprost (XALATAN) 0.005 % ophthalmic solution 1 drop  1 drop Both Eyes QHS Karmen Bongo, MD   1 drop at 04/19/22 2133   metoprolol succinate (TOPROL-XL) 24 hr tablet 12.5 mg  12.5 mg Oral QHS Florencia Reasons, MD       ondansetron [facility]) tablet 4 mg  4 mg Oral Q6H PRN Karmen Bongo,  MD       Or   ondansetron [facility]) injection 4 mg  4 mg Intravenous Q6H PRN Karmen Bongo, MD       oxyCODONE (Oxy IR/ROXICODONE) immediate release tablet 5 mg  5 mg Oral Q4H PRN Karmen Bongo, MD   5 mg at 04/19/22 2132   polycarbophil (FIBERCON) tablet 625 mg  625 mg Oral Daily Karmen Bongo, MD   625 mg at 04/20/22 1019   polyethylene glycol (MIRALAX / GLYCOLAX) packet 17 g  17 g Oral Daily PRN Karmen Bongo, MD       polyethylene glycol (MIRALAX / GLYCOLAX) packet 17 g  17 g Oral BID Florencia Reasons, MD   17 g at 04/20/22 1019   pravastatin (PRAVACHOL) tablet 10 mg  10 mg Oral Daily Karmen Bongo, MD   10 mg at 04/20/22 1019   rOPINIRole (REQUIP) tablet 0.25 mg  0.25 mg Oral Ivery Quale, MD   0.25 mg at 04/19/22 2132   senna-docusate (Senokot-S) tablet 1 tablet  1 tablet Oral BID Florencia Reasons,  MD   1 tablet at 04/20/22 1019   sodium chloride flush (NS) 0.9 % injection 3 mL  3 mL Intravenous Q12H Karmen Bongo, MD   3 mL at 04/19/22 1112   ticagrelor (BRILINTA) tablet 60 mg  60 mg Oral BID Karmen Bongo, MD   60 mg at 04/20/22 1127    Allergies as of 04/17/2022 - Review Complete 04/17/2022  Allergen Reaction Noted   Avandia [rosiglitazone maleate] Other (See Comments) 05/20/2008   Cephalexin Anaphylaxis, Swelling, and Rash 05/20/2008   Clindamycin Anaphylaxis and Shortness Of Breath 11/25/2013   Clindamycin hcl Anaphylaxis and Shortness Of Breath 10/07/2014   Humalog [insulin lispro] Itching 06/06/2014   Lincomycin Anaphylaxis and Shortness Of Breath 11/25/2013   Metformin Other (See Comments) 05/20/2008   Novolog [insulin aspart] Hives and Itching 04/04/2013   Penicillins Anaphylaxis, Hives, Swelling, and Rash 05/20/2008   Rosiglitazone Other (See Comments) 05/20/2008   Tizanidine Other (See Comments) 12/02/2012   Adhesive [tape] Other (See Comments) 02/13/2016   Atorvastatin Other (See Comments) 05/20/2008   Cholestyramine Other (See Comments) 03/16/2014   Gentamycin  [gentamicin] Hives, Itching, and Rash 12/21/2011   Iodinated contrast media Other (See Comments) 07/20/2011   Limonene Hives, Itching, Rash, and Other (See Comments) 10/07/2014   Prednisone Itching and Other (See Comments) 02/08/2016   Ranexa [ranolazine] Other (See Comments) 10/14/2017   Rosuvastatin Nausea And Vomiting, Nausea Only, and Other (See Comments) 04/03/2013   Simvastatin Other (See Comments) 05/20/2008   Strawberry extract Hives, Itching, Rash, and Other (See Comments) 09/07/2014   Azelastine  10/10/2020   Clopidogrel Other (See Comments) 04/29/2013   Lisinopril  10/10/2020   Oseltamivir phosphate  10/10/2020   Tizanidine hcl  10/10/2020   Codeine Nausea And Vomiting 05/20/2008   Erythromycin Nausea And Vomiting 05/20/2008   Hydrocodone-acetaminophen Itching, Rash, and Other (See Comments) 06/28/2015   Latex Itching and Other (See Comments) 05/10/2015   Neomycin Rash and Other (See Comments) 02/13/2016   Nickel Itching 04/21/2015   Ranitidine Nausea Only    Tamiflu [oseltamivir] Nausea And Vomiting 08/26/2013   Tramadol Nausea Only     Family History  Problem Relation Age of Onset   Heart attack Mother        d.61   Heart disease Mother    Anal fissures Mother    Heart attack Father        d.22   Diabetes Father    Heart disease Father    Lung cancer Sister        d.61 metastases to brain. History of smoking. Maternal half-sister.   Cancer Sister 30       unspecified gynecologic cancer (either cervical or ovarian) in her 45s.   Breast cancer Sister 63       d.42 from metastatic disease. Paternal half-sister.   Hypertension Brother    Glaucoma Brother    Parkinson's disease Paternal Aunt    Heart attack Maternal Grandmother    Cancer Maternal Grandfather 57       d.52s oral cancer   Colon cancer Neg Hx    Stomach cancer Neg Hx    Parkinsonism Neg Hx     Social History   Socioeconomic History   Marital status: Married    Spouse name: Not on file    Number of children: 2   Years of education: Not on file   Highest education level: Doctorate  Occupational History   Occupation: retired    Comment: elementary principal  Tobacco Use   Smoking  status: Never   Smokeless tobacco: Never  Vaping Use   Vaping Use: Never used  Substance and Sexual Activity   Alcohol use: No    Comment: social, 70s   Drug use: Never   Sexual activity: Yes  Other Topics Concern   Not on file  Social History Narrative   Retired Youth worker principal, married    Started disability September 2013      Has one daughter in [place], works at [employer]. Has a son, Aaron Edelman, who is Retail buyer.   Eats all food groups.   Wear seatbelt.    Attends church. [place], [place]. [place].    Social Determinants of Health   Financial Resource Strain: Not on file  Food Insecurity: Not on file  Transportation Needs: Not on file  Physical Activity: Not on file  Stress: Not on file  Social Connections: Not on file  Intimate Partner Violence: Not on file    Review of Systems: Pertinent positive and negative review of systems were noted in the above HPI section.  All other review of systems was otherwise negative.   Physical Exam: Vital signs in last 24 hours: Temp:  [98.1 F (36.7 C)-99 F (37.2 C)] 98.9 F (37.2 C) (09/29 0836) Pulse Rate:  [75-83] 77 (09/29 0836) Resp:  [16-18] 16 (09/29 0836) BP: (98-110)/(42-53) 109/45 (09/29 0836) SpO2:  [97 %-100 %] 100 % (09/29 0836) Weight:  [50.6 kg] 50.6 kg (09/28 1547) Last BM Date : 04/19/22 General:   Alert,  Well-developed, well-nourished, older white female pleasant and cooperative in NAD Head:  Normocephalic and atraumatic. Eyes:  Sclera clear, no icterus.   Conjunctiva pink. Ears:  Normal auditory acuity. Nose:  No deformity, discharge,  or lesions. Mouth:  No deformity or lesions.   Neck:  Supple; no masses or thyromegaly. Lungs:  Clear throughout to auscultation.   No wheezes, crackles, or rhonchi.  Heart:  Regular rate and rhythm; 4 out of 6 systolic murmur Abdomen:  Soft, midline incisional scar, she has a large ventral hernia in the right lower quadrant, and is tender near the inferior portion of this hernia ,BS decreased nonpalp mass or hsm.   Rectal:  not done  Msk:  Symmetrical without gross deformities. . Pulses:  Normal pulses noted. Extremities:  Without clubbing or edema. Neurologic:  Alert and  oriented x4;  grossly normal neurologically. Skin:  Intact without significant lesions or rashes.. Psych:  Alert and cooperative. Normal mood and affect.  Intake/Output from previous day: 09/28 0701 - 09/29 0700 In: 1869.7 [P.O.:600; I.V.:1269.7] Out: -  Intake/Output this shift: Total I/O In: 480 [P.O.:480] Out: -   Lab Results: Recent Labs    04/18/22 0355 04/19/22 0311 04/20/22 0245  WBC 8.8 9.1 7.0  HGB 11.2* 9.3* 8.8*  HCT 33.9* 27.9* 26.5*  PLT 130* 124* 118*   BMET Recent Labs    04/18/22 0355 04/19/22 0311 04/20/22 0245  NA 137 136 134*  K 3.9 4.4 4.3  CL 100 99 100  CO2 24 25 24   GLUCOSE 88 92 92  BUN 72* 68* 65*  CREATININE 2.38* 2.39* 2.40*  CALCIUM 9.9 9.2 8.4*   LFT Recent Labs    04/20/22 0245  PROT 7.1  ALBUMIN 2.9*  AST 28  ALT 31  ALKPHOS 220*  BILITOT 0.8   PT/INR No results for input(s): "LABPROT", "INR" in the last 72 hours. Hepatitis Panel No results for input(s): "HEPBSAG", "HCVAB", "HEPAIGM", "HEPBIGM" in the last 72 hours.  IMPRESSION:  #39 71 year old white female with 1 week history of initially rather generalized abdominal discomfort nausea and malaise, progressing to more focal pain in the right lower quadrant below a ventral hernia, described as sharp Obstipation associated with no bowel movement for several days  Work-up thus far with abdominal ultrasound shows multiple gallstones but no ductal dilation or wall thickening. Symptoms are not suggestive of biliary colic  Contrasted CT imaging raises possibility of  mesenteric adenitis with some haziness in the mesentery, she is status post right nephrectomy  Etiology of her current symptoms is not clear -given low-grade obstructive symptoms with the bloating obstipation nausea and pain This may represent mesenteric adenitis  #2-renal cell CA status post right nephrectomy 3 history of breast cancer #4 diabetes mellitus #5 NASH #6 coronary artery disease status post CABG, per 7 status post aortic valve replacement bioprosthetic #7 chronic kidney disease stage IV #8 atrial fibrillation #9prior history of breast cancer #10 chronic anticoagulation-Brilinta and aspirin #11 sleep apnea #12 status post pacemaker #13 anemia normocytic #14 history of adenomatous colon polyps   PLAN: Diet as tolerated Management for mesenteric adenitis is generally supportive   Purge bowel as doing, hopefully this will help with symptoms  Gi will follow with you    Arnett Galindez  PA-C9/29/2023, 12:26 PM

## 2022-04-21 DIAGNOSIS — K5900 Constipation, unspecified: Principal | ICD-10-CM

## 2022-04-21 DIAGNOSIS — R933 Abnormal findings on diagnostic imaging of other parts of digestive tract: Secondary | ICD-10-CM

## 2022-04-21 DIAGNOSIS — R1031 Right lower quadrant pain: Secondary | ICD-10-CM

## 2022-04-21 DIAGNOSIS — I880 Nonspecific mesenteric lymphadenitis: Secondary | ICD-10-CM | POA: Diagnosis not present

## 2022-04-21 LAB — GLUCOSE, CAPILLARY
Glucose-Capillary: 141 mg/dL — ABNORMAL HIGH (ref 70–99)
Glucose-Capillary: 90 mg/dL (ref 70–99)
Glucose-Capillary: 238 mg/dL — ABNORMAL HIGH (ref 70–99)
Glucose-Capillary: 106 mg/dL — ABNORMAL HIGH (ref 70–99)
Glucose-Capillary: 81 mg/dL (ref 70–99)

## 2022-04-21 LAB — CBC
HCT: 28.7 % — ABNORMAL LOW (ref 36.0–46.0)
Hemoglobin: 9.7 g/dL — ABNORMAL LOW (ref 12.0–15.0)
MCH: 29.7 pg (ref 26.0–34.0)
MCHC: 33.8 g/dL (ref 30.0–36.0)
MCV: 87.8 fL (ref 80.0–100.0)
Platelets: 129 10*3/uL — ABNORMAL LOW (ref 150–400)
RBC: 3.27 MIL/uL — ABNORMAL LOW (ref 3.87–5.11)
RDW: 13.5 % (ref 11.5–15.5)
WBC: 7.9 10*3/uL (ref 4.0–10.5)
nRBC: 0 % (ref 0.0–0.2)

## 2022-04-21 LAB — TYPE AND SCREEN
ABO/RH(D): O POS
Antibody Screen: NEGATIVE
Unit division: 0

## 2022-04-21 LAB — COMPREHENSIVE METABOLIC PANEL
ALT: 36 U/L (ref 0–44)
AST: 44 U/L — ABNORMAL HIGH (ref 15–41)
Albumin: 2.8 g/dL — ABNORMAL LOW (ref 3.5–5.0)
Alkaline Phosphatase: 295 U/L — ABNORMAL HIGH (ref 38–126)
Anion gap: 10 (ref 5–15)
BUN: 51 mg/dL — ABNORMAL HIGH (ref 8–23)
CO2: 24 mmol/L (ref 22–32)
Calcium: 8.3 mg/dL — ABNORMAL LOW (ref 8.9–10.3)
Chloride: 101 mmol/L (ref 98–111)
Creatinine, Ser: 1.94 mg/dL — ABNORMAL HIGH (ref 0.44–1.00)
GFR, Estimated: 28 mL/min — ABNORMAL LOW (ref >60)
Glucose, Bld: 147 mg/dL — ABNORMAL HIGH (ref 70–99)
Potassium: 3.3 mmol/L — ABNORMAL LOW (ref 3.5–5.1)
Sodium: 135 mmol/L (ref 135–145)
Total Bilirubin: 1 mg/dL (ref 0.3–1.2)
Total Protein: 6.6 g/dL (ref 6.5–8.1)

## 2022-04-21 LAB — BPAM RBC
Blood Product Expiration Date: 202310312359
ISSUE DATE / TIME: 202309291627
Unit Type and Rh: 5100

## 2022-04-21 LAB — LIPASE, BLOOD: Lipase: 79 U/L — ABNORMAL HIGH (ref 11–51)

## 2022-04-21 MED ORDER — FLEET ENEMA 7-19 GM/118ML RE ENEM: 1.0000 | ENEMA | Freq: Once | RECTAL | Status: DC

## 2022-04-21 MED ORDER — POTASSIUM CHLORIDE 20 MEQ PO PACK
40.0000 meq | PACK | Freq: Once | ORAL | Status: AC
  Administered 2022-04-21: 40 meq via ORAL
  Filled 2022-04-21: qty 2

## 2022-04-21 MED ORDER — POTASSIUM CHLORIDE CRYS ER 20 MEQ PO TBCR
40.0000 meq | EXTENDED_RELEASE_TABLET | Freq: Once | ORAL | Status: AC
  Administered 2022-04-21: 40 meq via ORAL
  Filled 2022-04-21: qty 2

## 2022-04-21 MED ORDER — MINERAL OIL RE ENEM
1.0000 | ENEMA | Freq: Once | RECTAL | Status: AC
  Administered 2022-04-21: 1 via RECTAL
  Filled 2022-04-21: qty 1

## 2022-04-21 MED ORDER — MINERAL OIL RE ENEM
1.0000 | ENEMA | Freq: Once | RECTAL | Status: AC
  Administered 2022-04-21: 1 via RECTAL
  Filled 2022-04-21 (×2): qty 1

## 2022-04-21 NOTE — Progress Notes (Addendum)
Patient ID: PEARLY BARTOSIK, female   DOB: December 27, 1950, 71 y.o.   MRN: [id number]    Progress Note   Subjective  Day # 3  CC; 1 week history of progressive abdominal pain, nausea, malaise, abdominal bloating/obstipation  Labs, WBC 7.9/hemoglobin 9.7/hematocrit 28.7 Potassium 3.3 BUN 51/creatinine 1.94 T. bili 1.0/alk phos 295/ALT 36/AST 44 Lipase 70  Tmax 99 5  Patient denies abdominal pain if lying still, feeling extremely bloated today not eating much because of feeling bloated.  Despite all the laxatives yesterday she never had a bowel movement yesterday and still has not had a bowel movement now x8 days   Objective   Vital signs in last 24 hours: Temp:  [98.4 F (36.9 C)-99.5 F (37.5 C)] 98.6 F (37 C) (09/30 0801) Pulse Rate:  [76-89] 76 (09/30 0801) Resp:  [16-18] 18 (09/30 0801) BP: (93-115)/(34-58) 93/34 (09/30 0801) SpO2:  [96 %-100 %] 96 % (09/30 0801) Last BM Date : 04/20/22 General:   older  white female in NAD Heart:  Regular rate and rhythm; no murmurs Lungs: Respirations even and unlabored, lungs CTA bilaterally Abdomen: More distended than yesterday, bowel sounds are present, there are some tenderness in the lower abdomen, more so in the right lower quadrant no rebound Extremities:  Without edema. Neurologic:  Alert and oriented,  grossly normal neurologically. Psych:  Cooperative. Normal mood and affect.  Intake/Output from previous day: 09/29 0701 - 09/30 0700 In: 1165.7 [P.O.:840; I.V.:17.7; Blood:308] Out: -  Intake/Output this shift: Total I/O In: 220 [P.O.:220] Out: -   Lab Results: Recent Labs    04/19/22 0311 04/20/22 0245 04/21/22 0251  WBC 9.1 7.0 7.9  HGB 9.3* 8.8* 9.7*  HCT 27.9* 26.5* 28.7*  PLT 124* 118* 129*   BMET Recent Labs    04/19/22 0311 04/20/22 0245 04/21/22 0251  NA 136 134* 135  K 4.4 4.3 3.3*  CL 99 100 101  CO2 _0 GLUCOSE 92 92 147*  BUN 68* 65* 51*  CREATININE 2.39* 2.40* 1.94*  CALCIUM 9.2 8.4*  8.3*   LFT Recent Labs    04/21/22 0251  PROT 6.6  ALBUMIN 2.8*  AST 44*  ALT 36  ALKPHOS 295*  BILITOT 1.0   PT/INR No results for input(s): "LABPROT", "INR" in the last 72 hours.  Studies/Results: DG Abd 1 View  Result Date: 04/19/2022 CLINICAL DATA:  Abdominal pain EXAM: ABDOMEN - 1 VIEW COMPARISON:  Ultrasound abdomen 04/18/2022 and CT abdomen and pelvis 04/17/2022 FINDINGS: The bowel gas pattern is normal. No radio-opaque calculi or other significant radiographic abnormality are seen. IMPRESSION: Negative. Electronically Signed   By: Placido Sou M.D.   On: 04/19/2022 18:59       Assessment / Plan:    #27 71 year old white female with 7 to 8-day history of generalized abdominal discomfort initially with nausea and malaise, then progressing to more focal right lower quadrant pain/discomfort below ventral hernia.  Illness associated with obstipation Despite several laxatives since admission she still did not have a bowel movement yesterday, feels more bloated and distended today  No leukocytosis or left shift Low-grade temp in the 99 range  Ultrasound on admission showed multiple gallstones but no ductal dilation or wall thickening  Noncontrast.  CT 4 days ago shows haziness in the mesenteric root with few prominent mesenteric lymph nodes consider mesenteric adenitis no evidence of acute cholecystitis, no dilated bowel   Unclear if this episode actually presents mesenteric adenitis.  Contrasted imaging would be helpful  however she has chronic kidney disease with creatinine in the 2 range  She certainly is obstipated, and may be developing an ileus versus partial obstruction  #2 cholelithiasis #3 history of renal cell CA status post right nephrectomy #4 history of breast cancer #5.  Insulin-dependent diabetes mellitus #6.  NASH #7.  Coronary artery disease status post CABG #8.  Status post AVR/bioprosthetic #9 chronic anticoagulation-Brilinta and aspirin #10  history of adenomatous colon polyps up-to-date with colonoscopy #11 status post pacemaker #12.  Normocytic anemia  # 13 mild lipase elevation nonspecific especially in setting of chronic kidney disease stage IV #14 elevated alkaline phosphatase-alk phos can also be elevated with chronic kidney disease, will fractionate  Plan;  will back off on diet full liquids Fleets enema this afternoon  will give 1 L of MoviPrep over 2 hours Check plain abdominal films in a.m. hopefully after bowel has purged  Fractionate alk phos If she does not have improvement in symptoms after bowel purge, will need to discuss further imaging.      Principal Problem:   Mesenteric adenitis Active Problems:   Hyperlipidemia   Hypertension   Arteriosclerotic cardiovascular disease (ASCVD)   Pacemaker   Chronic diastolic CHF (congestive heart failure) (HCC)   PAF (paroxysmal atrial fibrillation) (HCC)   Insulin dependent type 1 diabetes mellitus (HCC)   Obstructive sleep apnea treated with BiPAP   CKD (chronic kidney disease) stage 4, GFR 15-29 ml/min (HCC)   Multiple drug allergies   Protein-calorie malnutrition, severe     LOS: 3 days   Amy Esterwood PA-C 04/21/2022, 2:27 PM  I have taken an interval history, thoroughly reviewed the chart and examined the patient. I agree with the Advanced Practitioner's note, impression and recommendations, and have recorded additional findings, impressions and recommendations below. I performed a substantive portion of this encounter (>50% time spent), including a complete performance of the medical decision making.  My additional thoughts are as follows:  Severe constipation causing lower abdominal pain.  I think the reported mesenteric finding on CT scan is of no clinical significance.  Elevated LFTs also appear unrelated to these digestive symptoms, and will be worked up further with labs and outpatient follow-up.  We will give her bowel preparation solution  until it relieves her obstipation.   Nelida Meuse III Office:[phone]

## 2022-04-21 NOTE — Progress Notes (Signed)
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, right renal cell carcinoma status post right nephrectomy, presented to the hospital due to  feeling nauseous x 2wks, ab pain /feeling bloated x1week, no BM x1week,reports she was started on [place] two weeks ago, she stopped taking [place]  Reports ab started from RUQ, then generalized, then moved to RLQ   Subjective:  Denies chest pain today, reports feeling slightly stronger after prbc transfusion Currently no stomach pain, no nausea ,  but still feeling bloated and no bm, she now agrees to enema   Hgb improved, Lft and lipase  Husband at bedside   Assessment & Plan:  Principal Problem:   Mesenteric adenitis Active Problems:   Hyperlipidemia   Hypertension   Arteriosclerotic cardiovascular disease (ASCVD)   Pacemaker   Chronic diastolic CHF (congestive heart failure) (HCC)   PAF (paroxysmal atrial fibrillation) (HCC)   Insulin dependent type 1 diabetes mellitus ([place])   Obstructive sleep apnea treated with BiPAP   CKD (chronic kidney disease) stage 4, GFR 15-29 ml/min (HCC)   Multiple drug allergies   Protein-calorie malnutrition, severe  Normocytic anemia Reports blood in stool x1 on 9/28 Hgb dropped to 8.8 from 11.7, with angina symptom on 9/29 am after walking in hallway, she received prbc x1 on 9/29 GI consulted   AB pain, bloating Possible multifactorial including gastroparesis, symptomatic gallstone, constipation,  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 She denies recent h/o vital illness She does has h/o Hodgkin's lymphoma in the 1980's Discussed with GI, less likely malignancy unless persistence/enlarging, currently does not need to repeat imagining  Hypokalemia Likely from poor oral intake, also received IV Lasix on 9/29 Replace K, recheck in the morning   low normal BP, reports baseline sbp around 110. Probably from dehydration and analgesics Adjust bp meds with holding parameters Received gentle hydration, prbc transfusion, close monitor volume status  Insulin-dependent type 2 diabetes A1c 7.3% 01/2022 CBG ranges from 68-179 mg/dL over the last 24 hours Insulin pump parameters adjusted due to poor oral intake Appreciate diabetes RN input Diet liberalized due to poor oral intake  CAD/history of CABG and multiple stents Continue Brilinta ,beta-blocker, Imdur, statin, Repatha takes beta-blocker and Imdur at night  Chronic diastolic CHF -Currently appear dry -Hold diuretics , prn for now , iv lasix on 9/29x1  Afib, paroxysmal -Has pacemaker -Rate controlled with metoprolol -Not on AC due to recurrent GI bleeds -Cannot have Watchman due to nickel allergy  CKDIV H/o right nephrectomy -Renal function appear close to baseline, renal dosing meds  OSA , continue home biap  .  I have Reviewed nursing notes, Vitals, pain scores, I/o's, Lab results and  imaging results since pt's last encounter, details please see discussion above  I ordered the following  labs:  Unresulted Labs (From admission, onward)     Start     Ordered   04/22/22 5374  Basic metabolic panel  Daily at 5am,   R      04/21/22 1611    04/22/22 0500  Lipase, blood  Daily at 5am,   R      04/21/22 1611   04/22/22 0500  Hepatic function panel  Daily at 5am,   R      04/21/22 1611   04/21/22 1528  Nucleotidase, 5', blood  Once,   R        04/21/22 1527   04/21/22 0500  CBC  Daily at 5am,   R      04/20/22 1425   Unscheduled  Occult blood card to lab, stool  As needed,   R      04/20/22 1418             DVT prophylaxis: enoxaparin (LOVENOX) injection 30 mg Start: 04/18/22 0945   Code Status:   Code Status: Full Code  Family Communication: son at bedside on 9/29, husband at bedside on 9/30 Disposition:    Dispo: The patient is from: home              Anticipated d/c is to: home              Anticipated d/c date is: TBD  Antimicrobials:    Anti-infectives (From admission, onward)    None          Objective: Vitals:   04/20/22 2034 04/21/22 0320 04/21/22 0801 04/21/22 1603  BP: (!) 115/49 (!) 96/53 (!) 93/34 (!) 116/45  Pulse: 88 82 76 78  Resp: 16 16 18 18   Temp: 99.5 F (37.5 C) 98.4 F (36.9 C) 98.6 F (37 C) 98.4 F (36.9 C)  TempSrc: Oral Oral Oral Oral  SpO2: 98% 96% 96% 99%  Weight:      Height:        Intake/Output Summary (Last 24 hours) at 04/21/2022 1612 Last data filed at 04/21/2022 1428 Gross per 24 hour  Intake 1125.67 ml  Output --  Net 1125.67 ml   Filed Weights   04/17/22 1550 04/19/22 1547  Weight: 50.8 kg 50.6 kg    Examination:  General exam: alert, awake, communicative,calm, NAD Respiratory system: Clear to auscultation. Respiratory effort normal. Cardiovascular system:  RRR.  Gastrointestinal system: mild RLQ tenderness, Abdomen is nondistended, soft and nontender.  Normal bowel sounds heard. Central nervous system: Alert and oriented. No focal neurological deficits. Extremities:  no edema Skin: No rashes, lesions or ulcers Psychiatry: Judgement and insight appear normal. Mood & affect appropriate.     Data Reviewed: I have personally reviewed  labs  and visualized  imaging studies since the last encounter and formulate the plan        Scheduled Meds:  (feeding supplement) PROSource Plus  30 mL Oral BID BM   aspirin EC  81 mg Oral QHS   enoxaparin (LOVENOX) injection  30 mg Subcutaneous Q24H   escitalopram  5 mg Oral Daily   famotidine  10 mg Oral Daily   febuxostat  40 mg Oral QHS   feeding supplement (GLUCERNA SHAKE)  237 mL Oral TID BM   insulin pump   Subcutaneous TID WC, HS, 0200   isosorbide mononitrate  120 mg Oral QHS   latanoprost  1 drop Both Eyes QHS   metoprolol succinate  12.5 mg Oral QHS   mineral oil  1  enema Rectal Once   polycarbophil  625 mg Oral Daily   polyethylene glycol  17 g Oral BID   pravastatin  10 mg Oral Daily   rOPINIRole  0.25 mg Oral QHS   senna-docusate  1 tablet Oral BID   sodium chloride flush  3 mL Intravenous Q12H   ticagrelor  60 mg Oral BID   Continuous Infusions:     LOS: 3 days     Florencia Reasons, MD PhD FACP [facility]  Available via Epic secure chat 7am-7pm for nonurgent issues Please page for urgent issues To page the attending provider between 7A-7P or the covering provider during after hours 7P-7A, please log into the web site [email or web address] and access using universal [place] password for that web site. If you do not have the password, please call the hospital operator.    04/21/2022, 4:12 PM

## 2022-04-21 NOTE — Plan of Care (Signed)
  Problem: Pain Managment: Goal: General experience of comfort will improve Outcome: Progressing   Problem: Safety: Goal: Ability to remain free from injury will improve Outcome: Progressing   

## 2022-04-21 NOTE — Progress Notes (Signed)
Patient will self place home BIPAP when ready.

## 2022-04-22 ENCOUNTER — Encounter (HOSPITAL_BASED_OUTPATIENT_CLINIC_OR_DEPARTMENT_OTHER): Payer: Medicare PPO | Admitting: Cardiology

## 2022-04-22 ENCOUNTER — Inpatient Hospital Stay (HOSPITAL_COMMUNITY): Payer: Medicare PPO

## 2022-04-22 DIAGNOSIS — R1031 Right lower quadrant pain: Secondary | ICD-10-CM

## 2022-04-22 DIAGNOSIS — K8020 Calculus of gallbladder without cholecystitis without obstruction: Secondary | ICD-10-CM

## 2022-04-22 DIAGNOSIS — R748 Abnormal levels of other serum enzymes: Secondary | ICD-10-CM

## 2022-04-22 DIAGNOSIS — K5900 Constipation, unspecified: Secondary | ICD-10-CM

## 2022-04-22 DIAGNOSIS — I880 Nonspecific mesenteric lymphadenitis: Secondary | ICD-10-CM | POA: Diagnosis not present

## 2022-04-22 LAB — CBC
HCT: 31.1 % — ABNORMAL LOW (ref 36.0–46.0)
Hemoglobin: 10.5 g/dL — ABNORMAL LOW (ref 12.0–15.0)
MCH: 30 pg (ref 26.0–34.0)
MCHC: 33.8 g/dL (ref 30.0–36.0)
MCV: 88.9 fL (ref 80.0–100.0)
Platelets: 137 10*3/uL — ABNORMAL LOW (ref 150–400)
RBC: 3.5 MIL/uL — ABNORMAL LOW (ref 3.87–5.11)
RDW: 13.8 % (ref 11.5–15.5)
WBC: 7.4 10*3/uL (ref 4.0–10.5)
nRBC: 0 % (ref 0.0–0.2)

## 2022-04-22 LAB — BASIC METABOLIC PANEL
Anion gap: 14 (ref 5–15)
BUN: 41 mg/dL — ABNORMAL HIGH (ref 8–23)
CO2: 24 mmol/L (ref 22–32)
Calcium: 9.4 mg/dL (ref 8.9–10.3)
Chloride: 101 mmol/L (ref 98–111)
Creatinine, Ser: 1.78 mg/dL — ABNORMAL HIGH (ref 0.44–1.00)
GFR, Estimated: 31 mL/min — ABNORMAL LOW (ref >60)
Glucose, Bld: 82 mg/dL (ref 70–99)
Potassium: 4.3 mmol/L (ref 3.5–5.1)
Sodium: 139 mmol/L (ref 135–145)

## 2022-04-22 LAB — GLUCOSE, CAPILLARY
Glucose-Capillary: 96 mg/dL (ref 70–99)
Glucose-Capillary: 103 mg/dL — ABNORMAL HIGH (ref 70–99)
Glucose-Capillary: 139 mg/dL — ABNORMAL HIGH (ref 70–99)
Glucose-Capillary: 88 mg/dL (ref 70–99)
Glucose-Capillary: 220 mg/dL — ABNORMAL HIGH (ref 70–99)

## 2022-04-22 LAB — HEPATIC FUNCTION PANEL
ALT: 39 U/L (ref 0–44)
AST: 40 U/L (ref 15–41)
Albumin: 3 g/dL — ABNORMAL LOW (ref 3.5–5.0)
Alkaline Phosphatase: 342 U/L — ABNORMAL HIGH (ref 38–126)
Bilirubin, Direct: 0.5 mg/dL — ABNORMAL HIGH (ref 0.0–0.2)
Indirect Bilirubin: 0.8 mg/dL (ref 0.3–0.9)
Total Bilirubin: 1.3 mg/dL — ABNORMAL HIGH (ref 0.3–1.2)
Total Protein: 7.1 g/dL (ref 6.5–8.1)

## 2022-04-22 LAB — LIPASE, BLOOD: Lipase: 70 U/L — ABNORMAL HIGH (ref 11–51)

## 2022-04-22 LAB — GAMMA GT: GGT: 169 U/L — ABNORMAL HIGH (ref 7–50)

## 2022-04-22 MED ORDER — LACTATED RINGERS IV BOLUS
250.0000 mL | Freq: Once | INTRAVENOUS | Status: AC
  Administered 2022-04-22: 250 mL via INTRAVENOUS

## 2022-04-22 MED ORDER — PEG-KCL-NACL-NASULF-NA ASC-C 100 G PO SOLR
0.5000 | Freq: Once | ORAL | Status: AC
  Administered 2022-04-22: 100 g via ORAL
  Filled 2022-04-22 (×2): qty 1

## 2022-04-22 MED ORDER — METOPROLOL SUCCINATE ER 25 MG PO TB24
25.0000 mg | ORAL_TABLET | Freq: Every day | ORAL | Status: DC
  Administered 2022-04-22 – 2022-04-23 (×2): 25 mg via ORAL
  Filled 2022-04-22 (×2): qty 1

## 2022-04-22 NOTE — Progress Notes (Signed)
Mobility Specialist: Progress Note   04/22/22 1702  Mobility  Activity Refused mobility   Pt refused mobility d/t frequent urination and requesting to stay close to BR. Pt states she walked earlier today. Encouraged hallway ambulation as able this evening. Will f/u as able.   [facility] [place] Mobility Specialist Mobility Specialist [address]: [phone]

## 2022-04-22 NOTE — Progress Notes (Signed)
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, right renal cell carcinoma status post right nephrectomy, presented to the hospital due to  feeling nauseous x 2wks, ab pain /feeling bloated x1week, no BM x1week,reports she was started on [place] two weeks ago, she stopped taking [place]  Reports ab started from RUQ, then generalized, then moved to RLQ   Subjective:  Had several small bm, feeling slightly better but stomach still feel bloated Weight 110lbs Denies chest pain today,  Hgb improved,  Lft and lipase fluctuating but appear overall stable  Brother at bedside   Assessment & Plan:  Principal Problem:   Mesenteric adenitis Active Problems:   Hyperlipidemia   Hypertension   Arteriosclerotic cardiovascular disease (ASCVD)   Pacemaker   Chronic diastolic CHF (congestive heart failure) (HCC)   PAF (paroxysmal atrial fibrillation) (HCC)   Insulin dependent type 1 diabetes mellitus ([place])   Obstructive sleep apnea treated with BiPAP   CKD (chronic kidney disease) stage 4, GFR 15-29 ml/min (HCC)   Multiple drug allergies   Protein-calorie malnutrition, severe  Normocytic anemia Reports blood in stool x1 on 9/28  she received prbc x1 on 9/29 with angina symptom on 9/29 am after walking in hallway Hgb improved, denies chest pain, reports feeling stronger GI following  AB pain, bloating Possible multifactorial including gastroparesis, symptomatic gallstone, constipation,  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 She denies recent h/o vital illness She does has h/o Hodgkin's lymphoma in the 1980's Discussed with GI, less likely malignancy  May need to repeat imaging to evaluate persistence  Hypokalemia Likely from poor oral intake, also received IV Lasix on 9/29 Replaced and normalized  HTN reports baseline sbp around 110. Borderline hypotension initially on presentation , Probably from dehydration and analgesics, Toprol dose reduced initially She Received brief  gentle hydration, prbc transfusion, bp now back to baseline, resume home dose Toprol on 10/1 Continue hold diuretics as poor oral intake remain poor, weight is down   Insulin-dependent type 2 diabetes A1c 7.3% 01/2022 CBG ranges from 68-179 mg/dL over the last 24 hours Insulin pump parameters adjusted due to poor oral intake Appreciate diabetes RN input Diet liberalized due to poor oral intake Am blood glucose 82  CAD/history of CABG and multiple stents Continue Brilinta ,beta-blocker, Imdur, statin, Repatha takes beta-blocker and Imdur at night  Chronic diastolic CHF -Currently appear dry, weight is down -Hold diuretics , prn for now , iv lasix on 9/29x1  Afib, paroxysmal -Has pacemaker -Rate controlled with metoprolol -Not on AC due to recurrent GI bleeds -Cannot have Watchman due to nickel allergy  CKDIV H/o right nephrectomy -Renal function appear close to baseline, renal dosing meds  OSA , continue home bipap  .  I have Reviewed nursing notes, Vitals, pain scores, I/o's, Lab results and  imaging results since pt's last encounter, details please see discussion above  I ordered the following labs:  Unresulted Labs (From admission,  onward)     Start     Ordered   04/22/22 5784  Basic metabolic panel  Daily at 5am,   R      04/21/22 1611   04/22/22 0500  Lipase, blood  Daily at 5am,   R      04/21/22 1611   04/22/22 0500  Hepatic function panel  Daily at 5am,   R      04/21/22 1611   04/21/22 1528  Nucleotidase, 5', blood  Once,   R        04/21/22 1527   04/21/22 0500  CBC  Daily at 5am,   R      04/20/22 1425   Unscheduled  Occult blood card to lab, stool  As needed,   R      04/20/22 1418             DVT prophylaxis: enoxaparin (LOVENOX) injection 30 mg Start: 04/18/22 0945   Code Status:   Code Status: Full Code  Family Communication: son at bedside on 9/29, husband at bedside on 9/30 Disposition:    Dispo: The patient is from: home              Anticipated d/c is to: home              Anticipated d/c date is: 1-2 days if having bm and cleared by GI  Antimicrobials:    Anti-infectives (From admission, onward)    None          Objective: Vitals:   04/21/22 1603 04/21/22 2041 04/22/22 0530 04/22/22 0749  BP: (!) 116/45 (!) 106/43 (!) 118/55 (!) 112/53  Pulse: 78 98 71 72  Resp: 18 17 15 15   Temp: 98.4 F (36.9 C) 98.6 F (37 C) 97.6 F (36.4 C) 98.1 F (36.7 C)  TempSrc: Oral Oral Oral Oral  SpO2: 99% 99% 99% 96%  Weight:   49.9 kg   Height:        Intake/Output Summary (Last 24 hours) at 04/22/2022 0817 Last data filed at 04/21/2022 2100 Gross per 24 hour  Intake 560 ml  Output --  Net 560 ml   Filed Weights   04/17/22 1550 04/19/22 1547 04/22/22 0530  Weight: 50.8 kg 50.6 kg 49.9 kg    Examination:  General exam: alert, awake, communicative,calm, NAD Respiratory system: Clear to auscultation. Respiratory effort normal. Cardiovascular system:  RRR.  Gastrointestinal system: mild RLQ tenderness, Abdomen is nondistended, soft and nontender.  Normal bowel sounds heard. Central nervous system: Alert and oriented. No focal neurological deficits. Extremities:  no  edema Skin: No rashes, lesions or ulcers Psychiatry: Judgement and insight appear normal. Mood & affect appropriate.     Data Reviewed: I have personally reviewed  labs and visualized  imaging studies since the last encounter and formulate the plan        Scheduled Meds:  (feeding supplement) PROSource Plus  30 mL Oral BID BM   aspirin EC  81 mg Oral QHS   enoxaparin (LOVENOX) injection  30 mg Subcutaneous Q24H   escitalopram  5 mg Oral Daily   famotidine  10 mg Oral Daily   febuxostat  40 mg Oral QHS   feeding supplement (GLUCERNA SHAKE)  237 mL Oral TID BM   insulin pump   Subcutaneous TID WC, HS, 0200   isosorbide mononitrate  120 mg Oral QHS   latanoprost  1 drop Both Eyes QHS   metoprolol succinate  12.5 mg Oral QHS   polycarbophil  625 mg Oral Daily   polyethylene glycol  17 g Oral BID   pravastatin  10 mg Oral Daily   rOPINIRole  0.25 mg Oral QHS   senna-docusate  1 tablet Oral BID   sodium chloride flush  3 mL Intravenous Q12H   ticagrelor  60 mg Oral BID   Continuous Infusions:     LOS: 4 days     Florencia Reasons, MD PhD FACP [facility]  Available via Epic secure chat 7am-7pm for nonurgent issues Please page for urgent issues To page the attending provider between 7A-7P or the covering provider during after hours 7P-7A, please log into the web site [email or web address] and access using universal [place] password for that web site. If you do not have the password, please call the hospital operator.    04/22/2022, 8:17 AM

## 2022-04-22 NOTE — Progress Notes (Signed)
Pt awake in bed, no problems noted at this time. Bolus complete, no pain or distress.

## 2022-04-22 NOTE — Progress Notes (Signed)
Pt placed themself on home cpap unit without any assistance.

## 2022-04-22 NOTE — Progress Notes (Signed)
Pt initially with non-sustaining HR 120s-150s, then upon reassessment HR sustaining in 140s on telemonitor. Vital signs obtained-wnl. HR-80s on dynamap. Regular rhythm upon auscultation. SR on telemonitor. Pt denies pain at time of assessment, however she feels "winded at times". O2 sat 99% on room air, O2 at 0.5 liters for comfort. Contacted by [facility] telemonitoring regarding HR and reports they feel is a false reading as pt has lead off and needs to be replaced. Lead replaced by this nurse, HR noted in 80s on telemonitor upon reassessment. Jamison Neighbor notified of assessment and reassessment once leads replaced. EKG obtained, no acute findings noted. Pt stable and without pain or distress. Pt reports feeling "dry after all those bowel movements I had today". Jamison Neighbor made aware and ordered 250 ml bolus x1.

## 2022-04-22 NOTE — Plan of Care (Signed)

## 2022-04-22 NOTE — Progress Notes (Signed)
Pt refused HS dose of miralax, pt reports stomach "still griping". Reports to day shift nurse that she has had 3 small stools with various consistencies. Given scheduled senokot. Received antiemetic upon arrival of shift for nausea with partial effects noted. Pt encouraged to take laxative, however pt states that she doesn't believe she could tolerate at scheduled time.

## 2022-04-22 NOTE — Progress Notes (Addendum)
Patient ID: [name] [name], female   DOB: 04-22-1951, 71 y.o.   MRN: [id number]    Progress Note   Subjective  Day # 4 CC; 1 week history of progressive abdominal pain, nausea, abdominal bloating and obstipation  She is feeling a little bit better today, finally has had quite a few bowel movements after enemas and bowel prep yesterday-still feeling somewhat bloated and uncomfortable-no nausea or vomiting, would like to try some solids.  KUB today negative for any evidence of ileus or dilated bowel still has some stool in the colon  WBC 7.4/hemoglobin 10.5/hematocrit 31 point T bili 1.3/alk phos 342/AST/ALT 39 Lipase 70  5 ' nucleotidase and GGT pending   Objective   Vital signs in last 24 hours: Temp:  [97.6 F (36.4 C)-98.6 F (37 C)] 98.1 F (36.7 C) (10/01 0749) Pulse Rate:  [71-98] 72 (10/01 0749) Resp:  [15-18] 15 (10/01 0749) BP: (106-118)/(43-55) 112/53 (10/01 0749) SpO2:  [96 %-99 %] 96 % (10/01 0749) Weight:  [49.9 kg] 49.9 kg (10/01 0530) Last BM Date : 04/21/22 General:   Older white female in NAD Heart:  Regular rate and rhythm; no murmurs Lungs: Respirations even and unlabored, lungs CTA bilaterally Abdomen:  Soft, still some mild tenderness in the right lower quadrant in the area of the abdominal wall hernia, no guarding ,normal bowel sounds. Extremities:  Without edema. Neurologic:  Alert and oriented,  grossly normal neurologically. Psych:  Cooperative. Normal mood and affect.  Intake/Output from previous day: 09/30 0701 - 10/01 0700 In: 560 [P.O.:560] Out: -  Intake/Output this shift: No intake/output data recorded.  Lab Results: Recent Labs    04/20/22 0245 04/21/22 0251 04/22/22 0456  WBC 7.0 7.9 7.4  HGB 8.8* 9.7* 10.5*  HCT 26.5* 28.7* 31.1*  PLT 118* 129* 137*   BMET Recent Labs    04/20/22 0245 04/21/22 0251 04/22/22 0456  NA 134* 135 139  K 4.3 3.3* 4.3  CL 100 101 101  CO2 24 24 24   GLUCOSE 92 147* 82  BUN 65* 51* 41*   CREATININE 2.40* 1.94* 1.78*  CALCIUM 8.4* 8.3* 9.4   LFT Recent Labs    04/22/22 0456  PROT 7.1  ALBUMIN 3.0*  AST 40  ALT 39  ALKPHOS 342*  BILITOT 1.3*  BILIDIR 0.5*  IBILI 0.8   PT/INR No results for input(s): "LABPROT", "INR" in the last 72 hours.  Studies/Results: DG Abd 2 Views  Result Date: 04/22/2022 CLINICAL DATA:  Ileus. EXAM: ABDOMEN - 2 VIEW COMPARISON:  04/19/2022. FINDINGS: Normal bowel gas pattern. Specifically, no bowel dilation to suggest obstruction or significant adynamic ileus. No free air. Densities in the right mid to upper abdomen are consistent with gallstones noted along CT dated 04/17/2022. There is opacity at the right lung base consistent with loculated fluid and atelectasis/infection, new compared to the previous CT. IMPRESSION: 1. No evidence of bowel obstruction or generalized adynamic ileus. 2. New opacity at the right lung base consistent with loculated fluid and atelectasis/pneumonia. Recommend follow-up chest radiograph. Electronically Signed   By: Lajean Manes M.D.   On: 04/22/2022 08:57       Assessment / Plan:    #20 71 year old female with 7 to 8-day history of generalized abdominal discomfort, progressive bloating, obstipation with no bowel movement for 8 days.  Her pain on admission had localized more to the right lower quadrant below her abdominal wall hernia  Despite several laxatives had not had any bowel movements but finally yesterday after  enemas and a half of the movie prep has had several bowel movements.  She feels better but still mildly sore and tender more in the right lower quadrant  We suspect that her symptoms at this point are primarily secondary to severe constipation but she does not have any evidence of obstruction or ileus  She does not have pancreatitis and do not need to keep checking lipase  #2 gallstones, but no indication with ultrasound or noncontrasted CT scan of cholecystitis or any ductal dilation  #3  elevated alkaline phosphatase-unclear if this is of primary biliary etiology versus other GGT and 5 prime nucleotidase pending  #4 underlying Karlene Lineman #5 history of renal cell CA status post right nephrectomy, also has prior history of breast cancer #6 status post aortic valve replacement 7 coronary artery disease status post CABG 8 sick sinus syndrome status post pacemaker #9 Chronic normocytic anemia #10 chronic kidney disease #11 chronic anticoagulation on Brilinta and aspirin   Plan; we will continue efforts to completely purge her bowel-we will give another bottle of movie prep this afternoon  Advance to regular soft diet for dinner  Hopefully she will be able to go home tomorrow  Follow-up GGT and 5 prime nucleotidase  Patient has had a most recent GI care through [facility] Dr Derrill Kay , and can return to care of [facility] discharge.  She also keeps regular follow-up with Dr. Shelbie Ammons for NASH.     Principal Problem:   Mesenteric adenitis Active Problems:   Hyperlipidemia   Hypertension   Arteriosclerotic cardiovascular disease (ASCVD)   Pacemaker   Chronic diastolic CHF (congestive heart failure) (HCC)   PAF (paroxysmal atrial fibrillation) (HCC)   Insulin dependent type 1 diabetes mellitus (HCC)   Obstructive sleep apnea treated with BiPAP   CKD (chronic kidney disease) stage 4, GFR 15-29 ml/min (HCC)   Multiple drug allergies   Protein-calorie malnutrition, severe     LOS: 4 days   Amy Esterwood PA-C 04/22/2022, 11:38 AM  I have taken an interval history, thoroughly reviewed the chart and examined the patient. I agree with the Advanced Practitioner's note, impression and recommendations, and have recorded additional findings, impressions and recommendations below. I performed a substantive portion of this encounter (>50% time spent), including a complete performance of the medical decision making.  My additional thoughts are as follows:  Her lower abdominal pain and  bloating are improved after the bowel regimen she has received thus far resulting in multiple loose BMs.  She tells me she always has tenderness over the right abdominal wall ventral hernia that might be worse if she has constipation.  We are having her take another bottle of MoviPrep that I think will further relieve constipation and improve her abdominal pain and bloating.  We will advance her to a soft diet, and I expect she will be able to go home tomorrow. Unrelated to this pain but still important to note is that her alkaline phosphatase has continued to rise.  Review of CT and ultrasound do not show biliary ductal dilatation, but she has multiple calcified gallstones.  GGT pending.  She cannot have MRI/MRCP because she has a pacemaker.  If there is ongoing concern of choledocholithiasis, then outpatient endoscopic ultrasound may be warranted with her primary GI practice at [place]

## 2022-04-23 DIAGNOSIS — I880 Nonspecific mesenteric lymphadenitis: Secondary | ICD-10-CM | POA: Diagnosis not present

## 2022-04-23 LAB — BASIC METABOLIC PANEL
Anion gap: 6 (ref 5–15)
BUN: 35 mg/dL — ABNORMAL HIGH (ref 8–23)
CO2: 23 mmol/L (ref 22–32)
Calcium: 8.7 mg/dL — ABNORMAL LOW (ref 8.9–10.3)
Chloride: 108 mmol/L (ref 98–111)
Creatinine, Ser: 1.52 mg/dL — ABNORMAL HIGH (ref 0.44–1.00)
GFR, Estimated: 37 mL/min — ABNORMAL LOW (ref >60)
Glucose, Bld: 121 mg/dL — ABNORMAL HIGH (ref 70–99)
Potassium: 3.7 mmol/L (ref 3.5–5.1)
Sodium: 137 mmol/L (ref 135–145)

## 2022-04-23 LAB — GLUCOSE, CAPILLARY
Glucose-Capillary: 59 mg/dL — ABNORMAL LOW (ref 70–99)
Glucose-Capillary: 66 mg/dL — ABNORMAL LOW (ref 70–99)
Glucose-Capillary: 84 mg/dL (ref 70–99)
Glucose-Capillary: 138 mg/dL — ABNORMAL HIGH (ref 70–99)
Glucose-Capillary: 155 mg/dL — ABNORMAL HIGH (ref 70–99)
Glucose-Capillary: 108 mg/dL — ABNORMAL HIGH (ref 70–99)
Glucose-Capillary: 138 mg/dL — ABNORMAL HIGH (ref 70–99)
Glucose-Capillary: 128 mg/dL — ABNORMAL HIGH (ref 70–99)

## 2022-04-23 LAB — CBC
HCT: 29.2 % — ABNORMAL LOW (ref 36.0–46.0)
Hemoglobin: 9.9 g/dL — ABNORMAL LOW (ref 12.0–15.0)
MCH: 30.4 pg (ref 26.0–34.0)
MCHC: 33.9 g/dL (ref 30.0–36.0)
MCV: 89.6 fL (ref 80.0–100.0)
Platelets: 131 10*3/uL — ABNORMAL LOW (ref 150–400)
RBC: 3.26 MIL/uL — ABNORMAL LOW (ref 3.87–5.11)
RDW: 13.9 % (ref 11.5–15.5)
WBC: 6.6 10*3/uL (ref 4.0–10.5)
nRBC: 0 % (ref 0.0–0.2)

## 2022-04-23 MED ORDER — FAMOTIDINE 10 MG PO TABS
10.0000 mg | ORAL_TABLET | Freq: Once | ORAL | Status: AC
  Administered 2022-04-23: 10 mg via ORAL
  Filled 2022-04-23: qty 1

## 2022-04-23 MED ORDER — FUROSEMIDE 10 MG/ML IJ SOLN
40.0000 mg | Freq: Once | INTRAMUSCULAR | Status: AC
  Administered 2022-04-23: 40 mg via INTRAVENOUS
  Filled 2022-04-23: qty 4

## 2022-04-23 NOTE — Progress Notes (Signed)
Pt states she is not allergic to anything citrus or raw strawberries.

## 2022-04-23 NOTE — Progress Notes (Signed)
Mobility Specialist - Progress Note   04/23/22 1556  Mobility  Activity Ambulated with assistance in hallway  Activity Response Tolerated well  Distance Ambulated (ft) 550 ft  $Mobility charge 1 Mobility  Level of Assistance Standby assist, set-up cues, supervision of patient - no hands on  Assistive Device None    Pt received in bed agreeable to mobility. No complaints throughout. Left in bed w/ call bell in reach and all needs met.   Paulla Dolly Mobility Specialist

## 2022-04-23 NOTE — Significant Event (Signed)
Hypoglycemic Event  CBG: 59  Treatment: 4 oz juice/soda  Symptoms: None  Follow-up CBG: Time:0316 CBG Result:66  Possible Reasons for Event: Inadequate meal intake and Medication regimen:    Comments/MD notified: Jamison Neighbor  Insulin pump suspended, follow up CBG-84 at 0333, pump restarted. No adverse effects. Pt alert and oriented. Will continue to monitor closely throughout the night.   Sallye Lat

## 2022-04-23 NOTE — Progress Notes (Signed)
Pt has a home unit CPAP at bedside, pt states that she will place on herself when ready.

## 2022-04-23 NOTE — Progress Notes (Signed)
PT Cancellation Note  Patient Details Name: [name] [name] MRN: [id number] DOB: Jan 13, 1951   Cancelled Treatment:    Reason Eval/Treat Not Completed: Other (comment) Pt reports feeling stronger and closer to her baseline; pt states she has ambulated multiple laps with husband today. Encouraged continued mobilization. No further acute PT needs; thank you for this consult. Wyona Almas, PT, DPT Acute Rehabilitation Services Office [phone]    Deno Etienne 04/23/2022, 1:32 PM

## 2022-04-23 NOTE — Inpatient Diabetes Management (Signed)
Inpatient Diabetes Program Recommendations  AACE/ADA: New Consensus Statement on Inpatient Glycemic Control (2015)  Target Ranges:  Prepandial:   less than 140 mg/dL      Peak postprandial:   less than 180 mg/dL (1-2 hours)      Critically ill patients:  140 - 180 mg/dL   Lab Results  Component Value Date   GLUCAP 108 (H) 04/23/2022   HGBA1C 6.9 (H) 05/10/2015    Review of Glycemic Control  Current orders for Inpatient glycemic control:   Insulin Pump Had hypoglycemia last night of 59, 64 mg/dL, likely from NPO/CL status. Eating much better today. Pt prefers to leave insulin pump on.  Inpatient Diabetes Program Recommendations:   Continue with insulin pump.  Will follow.  Thank you. Lorenda Peck, RD, LDN, [place] Inpatient Diabetes Coordinator [phone]

## 2022-04-23 NOTE — Care Management Important Message (Signed)
Important Message  Patient Details  Name: [name] [name] MRN: [id number] Date of Birth: 19-Dec-1950   Medicare Important Message Given:  Yes     Hannah Beat 04/23/2022, 1:39 PM

## 2022-04-23 NOTE — Progress Notes (Signed)
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, right renal cell carcinoma status post right nephrectomy, presented to the hospital due to  feeling nauseous x 2wks, ab pain /feeling bloated x1week, no BM x1week,reports she was started on [place] two weeks ago, she stopped taking [place]  Reports ab started from RUQ, then generalized, then moved to RLQ   Subjective:  Last night had hypoglycemia event, also received ivf for one hr due to concerning for dehydration,   She reports ab pain has resolved but still feeling tight, not sure if fluids retention or not, her weight went up from 11o yesterday to 114 today, not sure if accurate,  she agreed to try iv lasix x1   She Would like to have regular diet , she prefers to continue current insulin pump setting  She does not feel she could go home today   husband at bedside   Assessment & Plan:  Principal Problem:   Mesenteric adenitis Active Problems:   Hyperlipidemia   Hypertension   Arteriosclerotic cardiovascular disease (ASCVD)   Calculus of gallbladder without cholecystitis without obstruction   Pacemaker   Chronic diastolic CHF (congestive heart failure) (HCC)   PAF (paroxysmal atrial fibrillation) (HCC)   Insulin dependent type 1 diabetes mellitus ([place])   Obstructive sleep apnea treated with BiPAP   CKD (chronic kidney disease) stage 4, GFR 15-29 ml/min (HCC)   Multiple drug allergies   Protein-calorie malnutrition, severe   RLQ abdominal pain   Obstipation   Elevated alkaline phosphatase level   AB pain, bloating Possible multifactorial including gastroparesis, constipation,  Improving after bowel purge Appreciate GI  input  Prominent Mesentric lymph nodes mesenteric adenitis? She denies recent h/o vital illness She does has h/o Hodgkin's lymphoma in the 1980's Discussed with GI, less likely malignancy  May need to repeat imaging to evaluate persistence  Normocytic anemia Reports blood in stool x1 on 9/28  she received prbc x1 on 9/29 with angina symptom on 9/29 am after walking in hallway Hgb improved, denies chest pain, reports feeling stronger GI following  Hypokalemia Likely from poor oral intake Replaced and normalized Repeat bmp in am  HTN reports baseline sbp around 110. -Borderline hypotension initially on presentation , Probably from dehydration and analgesics, Toprol dose reduced initially -She Received brief  gentle hydration, prbc transfusion, bp now back to baseline, resume home dose Toprol on 10/1 - diuretics  held as poor oral intake remain poor, possible able to resume on 10/2, getting iv lasix x1 on 10/2   Insulin-dependent type 2 diabetes, has hypoglycemia event A1c 7.3% 01/2022 Insulin pump parameters adjusted due to poor oral intake Diet liberalized due to poor oral intake Appreciate diabetes RN input  CAD/history of CABG and multiple stents Continue Brilinta ,beta-blocker, Imdur, statin, Repatha takes beta-blocker and Imdur at night  Chronic diastolic CHF -presents with dehydration due to poor oral intake -home diuretics held since admission, received iv lasix on 9/29x1 after prbc transfusion and iv lasix x1 on 10/2 -weight is up, likely able to resume home diuretic on 10/3  Afib, paroxysmal -Has pacemaker -Rate controlled with metoprolol -Not on AC due to recurrent GI bleeds -Cannot have Watchman due to nickel allergy  CKDIV H/o right nephrectomy -cr  improving, renal dosing meds  OSA , continue home bipap  .  I have Reviewed nursing notes, Vitals, pain scores, I/o's, Lab results and  imaging results since pt's last encounter, details please see discussion  above  I ordered the following labs:  Unresulted Labs (From admission, onward)     Start     Ordered   04/22/22 8527  Basic metabolic panel  Daily at 5am,   R      04/21/22 1611   04/21/22 1528  Nucleotidase, 5', blood  Once,   R        04/21/22 1527   Unscheduled  Occult blood card to lab, stool  As needed,   R      04/20/22 1418             DVT prophylaxis: enoxaparin (LOVENOX) injection 30 mg Start: 04/18/22 0945   Code Status:   Code Status: Full Code  Family Communication: son at bedside on 9/29, husband at bedside on 9/30 and 10/2 Disposition:    Dispo: The patient is from: home              Anticipated d/c is to: home              Anticipated d/c date is: likely on 10/3 if cleared by gi  Antimicrobials:    Anti-infectives (From admission, onward)    None          Objective: Vitals:   04/22/22 2207 04/23/22 0500 04/23/22 0506 04/23/22 0812  BP: (!) 112/56  (!) 100/44 (!) 101/47  Pulse: 82  67 68  Resp: 17  16 18   Temp: 98.7 F (37.1 C)  98 F (36.7 C) 97.9 F (36.6 C)  TempSrc: Oral  Axillary Oral  SpO2: 99%  97% 96%  Weight:  52.1 kg    Height:        Intake/Output Summary (Last 24 hours) at 04/23/2022 1921 Last data filed at 04/22/2022 2038 Gross per 24 hour  Intake 320 ml  Output --  Net 320 ml   Filed Weights   04/19/22 1547 04/22/22 0530 04/23/22 0500  Weight: 50.6 kg 49.9 kg 52.1 kg    Examination:  General exam: alert, awake, communicative,calm, NAD Respiratory system: Clear to auscultation. Respiratory effort normal. Cardiovascular system:  RRR.  Gastrointestinal system:  Abdomen is nondistended, soft and nontender.  Normal bowel sounds heard. Central nervous system: Alert and oriented. No focal neurological deficits. Extremities:  no edema Skin: No rashes, lesions or ulcers Psychiatry: Judgement and insight appear normal. Mood & affect appropriate.     Data Reviewed: I have personally reviewed  labs and visualized   imaging studies since the last encounter and formulate the plan        Scheduled Meds:  (feeding supplement) PROSource Plus  30 mL Oral BID BM   aspirin EC  81 mg Oral QHS   enoxaparin (LOVENOX) injection  30 mg Subcutaneous Q24H   escitalopram  5 mg Oral Daily   famotidine  10 mg Oral Daily   febuxostat  40 mg Oral QHS   feeding supplement (GLUCERNA SHAKE)  237 mL Oral TID BM   insulin pump   Subcutaneous TID WC, HS, 0200   isosorbide mononitrate  120 mg Oral QHS   latanoprost  1 drop Both Eyes QHS   metoprolol succinate  25 mg Oral QHS   polycarbophil  625 mg Oral Daily   polyethylene glycol  17 g Oral BID   pravastatin  10 mg Oral Daily   rOPINIRole  0.25 mg Oral QHS   senna-docusate  1 tablet Oral BID   sodium chloride flush  3 mL Intravenous Q12H   ticagrelor  60 mg Oral BID   Continuous Infusions:     LOS: 5 days     Florencia Reasons, MD PhD FACP [facility]  Available via Epic secure chat 7am-7pm for nonurgent issues Please page for urgent issues To page the attending provider between 7A-7P or the covering provider during after hours 7P-7A, please log into the web site [email or web address] and access using universal [place] password for that web site. If you do not have the password, please call the hospital operator.    04/23/2022, 7:21 PM

## 2022-04-23 NOTE — Progress Notes (Signed)
Daily Rounding Note  04/23/2022, 11:42 AM  LOS: 5 days   SUBJECTIVE:   Chief complaint:    Abd bloating and discomfort.     Patient symptoms improved after having multiple soft to liquid bowel movements late yesterday after completing movie prep.  Hypoglycemic overnight, treated with soda and graham crackers.  Had small amount of soft bits of stool this morning did not eat much for breakfast this morning.  Ate most of her chicken salad sandwich and other lunch items within the last hour and a half.  Went for a walk and developed recurrent bloating and abdominal discomfort.  Her belly subjectively feels tight though it is soft to exam.  No nausea.  Bloating makes it more difficult for her to take a deep breath.  Currently on bowel regimen of daily FiberCon, twice daily Senokot, twice daily MiraLAX though has not received this in the past 48 hours.  OBJECTIVE:         Vital signs in last 24 hours:    Temp:  [97.9 F (36.6 C)-98.7 F (37.1 C)] 97.9 F (36.6 C) (10/02 0812) Pulse Rate:  [67-82] 68 (10/02 0812) Resp:  [15-18] 18 (10/02 0812) BP: (100-112)/(44-56) 101/47 (10/02 0812) SpO2:  [96 %-100 %] 96 % (10/02 0812) Weight:  [52.1 kg] 52.1 kg (10/02 0500) Last BM Date : 04/22/22 (mulitiple) Filed Weights   04/19/22 1547 04/22/22 0530 04/23/22 0500  Weight: 50.6 kg 49.9 kg 52.1 kg   General: Somewhat pale but overall looks well.  Alert.  Comfortable Heart: RRR with soft murmur. Chest: Clear bilaterally.  No labored breathing.  No cough. Abdomen: No distention.  Soft.  Not tender.  Bowel sounds active without high-pitched quality.  Insulin pump access site in left lower abdomen.  No visible bruising. Extremities: No CCE. Neuro/Psych: Alert.  Oriented x3.  No tremors or gross weakness.  Intake/Output from previous day: 10/01 0701 - 10/02 0700 In: 780 [P.O.:780] Out: -   Intake/Output this shift: No intake/output data  recorded.  Lab Results: Recent Labs    04/21/22 0251 04/22/22 0456 04/23/22 0047  WBC 7.9 7.4 6.6  HGB 9.7* 10.5* 9.9*  HCT 28.7* 31.1* 29.2*  PLT 129* 137* 131*   BMET Recent Labs    04/21/22 0251 04/22/22 0456 04/23/22 0047  NA 135 139 137  K 3.3* 4.3 3.7  CL 101 101 108  CO2 24 24 23   GLUCOSE 147* 82 121*  BUN 51* 41* 35*  CREATININE 1.94* 1.78* 1.52*  CALCIUM 8.3* 9.4 8.7*   LFT Recent Labs    04/21/22 0251 04/22/22 0456  PROT 6.6 7.1  ALBUMIN 2.8* 3.0*  AST 44* 40  ALT 36 39  ALKPHOS 295* 342*  BILITOT 1.0 1.3*  BILIDIR  --  0.5*  IBILI  --  0.8   PT/INR No results for input(s): "LABPROT", "INR" in the last 72 hours. Hepatitis Panel No results for input(s): "HEPBSAG", "HCVAB", "HEPAIGM", "HEPBIGM" in the last 72 hours.  Studies/Results: DG Abd 2 Views  Result Date: 04/22/2022 CLINICAL DATA:  Ileus. EXAM: ABDOMEN - 2 VIEW COMPARISON:  04/19/2022. FINDINGS: Normal bowel gas pattern. Specifically, no bowel dilation to suggest obstruction or significant adynamic ileus. No free air. Densities in the right mid to upper abdomen are consistent with gallstones noted along CT dated 04/17/2022. There is opacity at the right lung base consistent with loculated fluid and atelectasis/infection, new compared to the previous CT. IMPRESSION: 1. No evidence  of bowel obstruction or generalized adynamic ileus. 2. New opacity at the right lung base consistent with loculated fluid and atelectasis/pneumonia. Recommend follow-up chest radiograph. Electronically Signed   By: Lajean Manes M.D.   On: 04/22/2022 08:57    Scheduled Meds:  (feeding supplement) PROSource Plus  30 mL Oral BID BM   aspirin EC  81 mg Oral QHS   enoxaparin (LOVENOX) injection  30 mg Subcutaneous Q24H   escitalopram  5 mg Oral Daily   famotidine  10 mg Oral Daily   febuxostat  40 mg Oral QHS   feeding supplement (GLUCERNA SHAKE)  237 mL Oral TID BM   insulin pump   Subcutaneous TID WC, HS, 0200    isosorbide mononitrate  120 mg Oral QHS   latanoprost  1 drop Both Eyes QHS   metoprolol succinate  25 mg Oral QHS   polycarbophil  625 mg Oral Daily   polyethylene glycol  17 g Oral BID   pravastatin  10 mg Oral Daily   rOPINIRole  0.25 mg Oral QHS   senna-docusate  1 tablet Oral BID   sodium chloride flush  3 mL Intravenous Q12H   ticagrelor  60 mg Oral BID   Continuous Infusions: PRN Meds:.acetaminophen **OR** acetaminophen, ALPRAZolam, bisacodyl, fentaNYL (SUBLIMAZE) injection, hydrALAZINE, nitroGLYCERIN, ondansetron **OR** ondansetron (ZOFRAN) IV, oxyCODONE, polyethylene glycol  ASSESMENT:   Obstipation, abdominal bloating, abdominal pain.  CT wo contrast: Gallstones, previous right nephrectomy, previous hysterectomy.  New haziness at mesenteric root raising concern for mesenteric adenitis.  Ultrasound with multiple, uncomplicated gallstones, no ductal dilatation.  Several bowel movements following administration of enemas and completion of bowel prep. Yesterday's 2 view abdominal films showed no obstruction or ileus.  Incidentally did show new right lung base opacity/loculated fluid/atelectasis/pneumonia.  Elevated alkaline phosphatase.  GGT elevated 169. 5 prime nucleotidase pending.  NASH. Liver biopsy 1999: steatohepatitis.   FibroScan liver stiffness measurement of 28.7  kPa.   At 02/2022 office visit with [facility]/hepatology felt likely to have cirrhosis.  Ultrasound of 04/18/2022 shows liver parenchyma with normal echo pattern.     S/p AVR.  Brilinta in place.     CHF.  Weight has gone from 50.6 to 52.1  kg over last 4days.      Hx renal cell carcinoma/right nephrectomy and breast cancer.  IDDM.  Insulin pump in place   Hx Hodgkins dz, treated w radiation.      Remote duodenal ulcer.      Hx IDA.  Previous requirements for parenteral iron and blood transfusion.  EGD and capsule endoscopy normal Sept 2014, colonoscopy normal Dec 2013.  Negative celiac tests.   PLAN      ? Stop fibercon and stick w Miralax, Senokot?  If, by later this afternoon has ongoing bloating, repeat KUB.    Azucena Freed  04/23/2022, 11:42 AM Phone [phone]

## 2022-04-24 ENCOUNTER — Other Ambulatory Visit (HOSPITAL_COMMUNITY): Payer: Self-pay

## 2022-04-24 DIAGNOSIS — I880 Nonspecific mesenteric lymphadenitis: Secondary | ICD-10-CM | POA: Diagnosis not present

## 2022-04-24 LAB — BASIC METABOLIC PANEL
Anion gap: 9 (ref 5–15)
BUN: 43 mg/dL — ABNORMAL HIGH (ref 8–23)
CO2: 22 mmol/L (ref 22–32)
Calcium: 8.8 mg/dL — ABNORMAL LOW (ref 8.9–10.3)
Chloride: 106 mmol/L (ref 98–111)
Creatinine, Ser: 1.95 mg/dL — ABNORMAL HIGH (ref 0.44–1.00)
GFR, Estimated: 27 mL/min — ABNORMAL LOW (ref >60)
Glucose, Bld: 121 mg/dL — ABNORMAL HIGH (ref 70–99)
Potassium: 3.8 mmol/L (ref 3.5–5.1)
Sodium: 137 mmol/L (ref 135–145)

## 2022-04-24 LAB — GLUCOSE, CAPILLARY
Glucose-Capillary: 109 mg/dL — ABNORMAL HIGH (ref 70–99)
Glucose-Capillary: 77 mg/dL (ref 70–99)
Glucose-Capillary: 130 mg/dL — ABNORMAL HIGH (ref 70–99)
Glucose-Capillary: 188 mg/dL — ABNORMAL HIGH (ref 70–99)

## 2022-04-24 MED ORDER — SENNOSIDES-DOCUSATE SODIUM 8.6-50 MG PO TABS
1.0000 | ORAL_TABLET | Freq: Two times a day (BID) | ORAL | 0 refills | Status: DC
  Filled 2022-04-24: qty 60, 30d supply, fill #0

## 2022-04-24 MED ORDER — POLYETHYLENE GLYCOL 3350 17 GM/SCOOP PO POWD
17.0000 g | Freq: Two times a day (BID) | ORAL | 1 refills | Status: DC
  Filled 2022-04-24: qty 238, 7d supply, fill #0

## 2022-04-24 MED ORDER — BISACODYL 5 MG PO TBEC
5.0000 mg | DELAYED_RELEASE_TABLET | Freq: Every day | ORAL | 0 refills | Status: DC | PRN
  Filled 2022-04-24: qty 30, 30d supply, fill #0

## 2022-04-24 NOTE — Progress Notes (Signed)
Initial Nutrition Assessment  DOCUMENTATION CODES:   Severe malnutrition in context of chronic illness  INTERVENTION:  Continue regular diet MVI with minerals daily Prosource Plus BID to provide 100kcal and 20g of protein per packet Glucerna Shake po TID, each supplement provides 220 kcal and 10 grams of protein  NUTRITION DIAGNOSIS:   Severe Malnutrition related to chronic illness (CHF, hx cancer) as evidenced by severe fat depletion, severe muscle depletion. - remains applicable  GOAL:   Patient will meet greater than or equal to 90% of their needs - progressing, appetite improving  MONITOR:   PO intake, Labs, I & O's  REASON FOR ASSESSMENT:  Consult  (nutritional goals)  ASSESSMENT:  Pt with hx of atrial fibrillation, hx renal cancer (s/p right nephrectomy), hx breast cancer, CHF, CKD4, CAD, DM type 1, HTN, HLD, NASH, presented to ED with worsening abdominal pain. Found to have mesenteric adenitis.  Pt resting in bed at the time of visit, husband at bedside. Reports an improved appetite today. Has been refusing most supplements.   Discussed the reason for the supplements was to augment intake and encourage re-gain of recently weight loss. Understands to ask for one if intake is poor at a meal.   No further concerns expressed today.  Average Meal Intake: 9/28-10/3: 67% average intake x 9 recorded meals  Nutritionally Relevant Medications: Scheduled Meds:  famotidine  10 mg Oral Daily   febuxostat  40 mg Oral QHS   GLUCERNA SHAKE  237 mL Oral TID BM   insulin pump   Subcutaneous TID WC, HS, 0200   polycarbophil  625 mg Oral Daily   polyethylene glycol  17 g Oral BID   pravastatin  10 mg Oral Daily   senna-docusate  1 tablet Oral BID   PRN Meds: bisacodyl, ondansetron, polyethylene glycol  Labs Reviewed: BUN 43, creatinine 1.95 CBG ranges from 77-155 mg/dL over the last 24 hours HgbA1c 7.3% (7/12, [facility])  NUTRITION - FOCUSED PHYSICAL EXAM: Flowsheet Row  Most Recent Value  Orbital Region Mild depletion  Upper Arm Region Severe depletion  Thoracic and Lumbar Region Severe depletion  Buccal Region Mild depletion  Temple Region No depletion  Clavicle Bone Region Mild depletion  Clavicle and Acromion Bone Region Severe depletion  Scapular Bone Region Moderate depletion  Dorsal Hand Mild depletion  Patellar Region Severe depletion  Anterior Thigh Region Severe depletion  Posterior Calf Region Severe depletion  Edema (RD Assessment) None  Hair Reviewed  Eyes Reviewed  Mouth Reviewed  Skin Reviewed  Nails Reviewed    Diet Order:   Diet Order             Diet regular Room service appropriate? Yes; Fluid consistency: Thin  Diet effective now                   EDUCATION NEEDS:   Not appropriate for education at this time  Skin:  Skin Assessment: Reviewed RN Assessment  Last BM:  10/2  Height:  Ht Readings from Last 1 Encounters:  04/19/22 5' 2"  (1.575 m)    Weight:  Wt Readings from Last 1 Encounters:  04/23/22 52.1 kg    Ideal Body Weight:  50 kg  BMI:  Body mass index is 21.01 kg/m.  Estimated Nutritional Needs:  Kcal:  1500-1700 kcal/d Protein:  75-90 g/d Fluid:  >/=1.5L    Ranell Patrick, RD, LDN Clinical Dietitian RD pager # available in [facility]  After hours/weekend pager # available in [facility]

## 2022-04-24 NOTE — Discharge Summary (Signed)
Physician Discharge Summary  DANN VENTRESS WHQ:[id number] DOB: 10-09-1950 DOA: 04/17/2022  PCP: [name], MD  Admit date: 04/17/2022 Discharge date: 04/24/2022  Admitted From: Home Disposition: Home  Recommendations for Outpatient Follow-up:  Follow up with PCP in 1-2 weeks Please obtain BMP/CBC in one week your next doctors visit.  MiraLAX and senna has been prescribed.  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 Reports ab started from RUQ, then generalized, then moved to RLQ The hospitalization no obvious evidence of obstruction was noted she was started on aggressive bowel regimen.  Slowly started having bowel movements and felt much better.  Today she is medically stable for discharge with recommendations as stated above.    AB pain, bloating Possible multifactorial including gastroparesis, constipation,  Patient received aggressive bowel regimen during the hospitalization.  No obvious evidence of obstruction was noted.  Slowly she started having bowel movements.  She was also advised to take fiber products including aggressive bowel regimen as mentioned above.  Follow-up outpatient gastroenterology.  May need to  be started on Linzess eventually   Prominent Mesentric lymph nodes Elevated ALP, history of [place] mesenteric adenitis? She denies recent h/o vital illness She does has h/o Hodgkin's lymphoma in the 1980's Less likely malignancy.  Follow-up outpatient gastroenterology, may require repeat scan   Normocytic anemia Stable hemoglobin   Hypokalemia As needed repletion   HTN reports baseline sbp around 110. Resume outpatient regimen     Insulin-dependent type 2 diabetes, has hypoglycemia event A1c 7.3% 01/2022 Resume home regimen   CAD/history of CABG and multiple stents Continue Brilinta ,beta-blocker, Imdur, statin, Repatha takes beta-blocker and Imdur at night   Chronic diastolic CHF Currently appears to be euvolemic.  Resume home regimen   Afib, paroxysmal Status post AVR Pacemaker in place.   CKDIV H/o right nephrectomy -cr improving, renal dosing meds   OSA , continue home bipap     Discharge Diagnoses:  Principal Problem:   Mesenteric adenitis Active Problems:   Hyperlipidemia   Hypertension   Arteriosclerotic cardiovascular disease (ASCVD)   Calculus of gallbladder without cholecystitis without obstruction   Pacemaker   Chronic diastolic CHF (congestive heart failure) (HCC)   PAF (paroxysmal atrial fibrillation) (HCC)   Insulin dependent type 1 diabetes mellitus (HCC)   Obstructive sleep apnea treated with BiPAP   CKD (chronic kidney disease) stage 4, GFR 15-29 ml/min (HCC)   Multiple drug allergies   Protein-calorie malnutrition, severe   RLQ abdominal pain   Obstipation   Elevated alkaline phosphatase level      Consultations: [facility]  Subjective: Feeling better this morning, no new complaints. Husband is present at bedside  Discharge Exam: Vitals:   04/24/22 0805 04/24/22 1036  BP: (!) 110/37 (!) 109/48  Pulse: 72 78  Resp: 17 17  Temp: 98 F (36.7 C)  98 F (36.7 C)  SpO2: 99% 99%   Vitals:   04/23/22 2032 04/24/22 0443  04/24/22 0805 04/24/22 1036  BP: (!) 110/43 (!) 132/56 (!) 110/37 (!) 109/48  Pulse: 77 70 72 78  Resp: 18 18 17 17   Temp: 99.5 F (37.5 C) 98.1 F (36.7 C) 98 F (36.7 C) 98 F (36.7 C)  TempSrc: Oral Oral Oral Oral  SpO2: 99% 99% 99% 99%  Weight:      Height:        General: Pt is alert, awake, not in acute distress Cardiovascular: RRR, S1/S2 +, no rubs, no gallops Respiratory: CTA bilaterally, no wheezing, no rhonchi Abdominal: Soft, NT, ND, bowel sounds + Extremities: no edema, no cyanosis  Discharge Instructions   Allergies as of 04/24/2022       Reactions   Avandia [rosiglitazone Maleate] Other (See Comments)   Congestive heart failure   Cephalexin Anaphylaxis, Swelling, Rash   Extremities swell , throat swelling   Clindamycin Anaphylaxis, Shortness Of Breath   Other reaction(s): esophageal spasms   Clindamycin Hcl Anaphylaxis, Shortness Of Breath   Humalog [insulin Lispro] Itching    big knots and whelp   Lincomycin Anaphylaxis, Shortness Of Breath   Metformin Other (See Comments)   Congestive heart failure   Novolog [insulin Aspart] Hives, Itching   Humalog & Novolog big knots and whelp   Penicillins Anaphylaxis, Hives, Swelling, Rash   Did it involve swelling of the face/tongue/throat, SOB, or low BP? Yes Did it involve sudden or severe rash/hives, skin peeling, or any reaction on the inside of your mouth or nose? No Did you need to seek medical attention at a hospital or doctor's office? Yes When did it last happen?      Childhood allergy  If all above answers are "NO", may proceed with cephalosporin use.   Rosiglitazone Other (See Comments)   Congestive heart failure   Tizanidine Other (See Comments)   Dizziness, Mental Status Changes, Hallucination   Adhesive [tape] Other (See Comments)   Tears skin, Please use "paper" tape   Atorvastatin Other (See Comments)   increased LFT's, no muscle weakness, leg pain Muscle and joint pain increased  LFT's Increased LFTs   Cholestyramine Other (See Comments)   Liver Disorder Elevated LFTs   Gentamycin [gentamicin] Hives, Itching, Rash   Fine red bumps.  Reaction noted post loop recorder implant, where gentamycin was used for irrigation. Topical prep   Iodinated Contrast Media Other (See Comments)   PAIN DURING LYMPHANGIOGRAM '81, NO ALLERGY TO IV CONTRAST.  Has had procedures with Iodine since without problems.   Limonene Hives, Itching, Rash, Other (See Comments)   Reaction noted post loop recorder implant, where gentamycin was used for irrigation. Topical prep   Prednisone Itching, Other (See Comments)   B/P went high, itching all over.  Tolerates with benadryl    Ranexa [ranolazine] Other (See Comments)   Nausea, dizziness, low blood sugar, tingling in right hand, "blood blisters" on tongue    Rosuvastatin Nausea And Vomiting, Nausea Only, Other (See Comments)   Muscle and joint pain & increased LFTs; tolerates Pravastatin 23m (max) Other reaction(s): elevated LFTs   Simvastatin Other (See Comments)   Increased LFT's   Azelastine Other (See Comments)   Other reaction(s): metallic taste,spitting up blood   Clopidogrel Other (See Comments)   Does not work for patient Medicine was not effective Other reaction(s): Unknown   Lisinopril Diarrhea, Nausea Only, Other (See Comments)   Oseltamivir Phosphate Nausea And  Vomiting   Tizanidine Hcl    Other reaction(s): dizzy, hallucinations   Codeine Nausea And Vomiting   Erythromycin Nausea And Vomiting   Hydrocodone-acetaminophen Itching, Rash, Other (See Comments)   itching   Latex Itching, Other (See Comments)   I"if wear gloves hands get red ,itchy no prioblem if other wear and touch me"   Neomycin Rash, Other (See Comments)   Redness   Nickel Itching   Ranitidine Nausea Only   Tamiflu [oseltamivir] Nausea And Vomiting   Tramadol Nausea Only        Medication List     TAKE these medications    acyclovir ointment 5  % Commonly known as: ZOVIRAX Apply 1 application topically every 3 (three) hours as needed (fever blister/cold sores).   ALPRAZolam 0.5 MG tablet Commonly known as: XANAX Take 1 tablet (0.5 mg total) by mouth 2 (two) times daily as needed for anxiety.   Apidra 100 UNIT/ML injection Generic drug: insulin glulisine Inject 40 Units into the skin as directed. INFUSE THROUGH INSULIN PUMP UTD, Bolus depends on carb intake   aspirin EC 81 MG tablet Take 81 mg by mouth at bedtime.   Benefiber Powd Take 1 Dose by mouth daily.   bisacodyl 5 MG EC tablet Commonly known as: DULCOLAX Take 1 tablet (5 mg total) by mouth daily as needed for moderate constipation.   Brilinta 60 MG Tabs tablet Generic drug: ticagrelor TAKE ONE TABLET (60MG TOTAL) BY MOUTH TWICE DAILY What changed: See the new instructions.   cetirizine 10 MG tablet Commonly known as: ZYRTEC Take 10 mg by mouth daily.   clobetasol 0.05 % topical foam Commonly known as: OLUX Apply 1 application topically 2 (two) times daily as needed (skin flaking).   Co Q 10 100 MG Caps Take 100 mg by mouth at bedtime.   colchicine 0.6 MG tablet Take 0.6 mg by mouth daily as needed (gout flare).   denosumab 60 MG/ML Sosy injection Commonly known as: PROLIA Inject 60 mg into the skin every 6 (six) months.   diclofenac Sodium 1 % Gel Commonly known as: VOLTAREN Apply 2 g topically 4 (four) times daily.   docusate sodium 100 MG capsule Commonly known as: COLACE Take 100 mg by mouth as needed for mild constipation.   escitalopram 5 MG tablet Commonly known as: LEXAPRO Take 5 mg by mouth daily.   famotidine 40 MG tablet Commonly known as: PEPCID Take 40 mg by mouth daily. Takes a second tablet at night if needed.   Glucagon Emergency 1 MG Kit Inject 1 mg into the vein once as needed (low blood sugar).   isosorbide mononitrate 120 MG 24 hr tablet Commonly known as: IMDUR TAKE ONE TABLET (120MG TOTAL) BY MOUTH DAILY What  changed: See the new instructions.   Kerendia 10 MG Tabs Generic drug: Finerenone Take 10 mg by mouth daily.   latanoprost 0.005 % ophthalmic solution Commonly known as: XALATAN Place 1 drop into both eyes at bedtime.   metolazone 2.5 MG tablet Commonly known as: ZAROXOLYN Take 2.5 mg (1 tab) once as directed by CHF clinic. Call before taking [phone]   metoprolol succinate 25 MG 24 hr tablet Commonly known as: Toprol XL Take 1 tablet (25 mg total) by mouth daily.   mometasone 50 MCG/ACT nasal spray Commonly known as: NASONEX Place 2 sprays into the nose daily as needed (allergy).   mupirocin cream 2 % Commonly known as: Bactroban Apply 1 application topically 2 (two) times daily as needed.  What changed: reasons to take this   nitroGLYCERIN 0.4 MG/SPRAY spray Commonly known as: NITROLINGUAL PLACE 1 SPRAY UNDER THE TONGUE EVERY 5 MINUTES FOR 3 DOSES AS NEEDED FOR CHEST PAIN. What changed: See the new instructions.   polyethylene glycol 17 g packet Commonly known as: MIRALAX / GLYCOLAX Take 17 g by mouth 2 (two) times daily. What changed:  when to take this reasons to take this   pravastatin 10 MG tablet Commonly known as: PRAVACHOL TAKE ONE TABLET (10 MG TOTAL) BY MOUTH DAILY. What changed: See the new instructions.   Repatha SureClick 962 MG/ML Soaj Generic drug: Evolocumab INJECT 140MG INTO THE SKIN EVERY 14 DAYS What changed: See the new instructions.   rOPINIRole 0.25 MG tablet Commonly known as: REQUIP TAKE ONE TABLET (0.25MG TOTAL) BY MOUTH DAILY. TAKE TWO HOURS PRIOR TO BEDTIME EVERY NIGHT. What changed: See the new instructions.   senna-docusate 8.6-50 MG tablet Commonly known as: Senokot-S Take 1 tablet by mouth 2 (two) times daily.   silver nitrate applicators 22-97 % applicator Use as needed to stop superficial skin bleeding. What changed:  how much to take how to take this when to take this reasons to take this   torsemide 20 MG  tablet Commonly known as: DEMADEX Take 2 tablets by mouth daily alternating with 83m daily. What changed:  how much to take how to take this when to take this additional instructions   Uloric 40 MG tablet Generic drug: febuxostat Take 1 tablet (40 mg total) by mouth at bedtime.   Vascepa 1 g capsule Generic drug: icosapent Ethyl TAKE TWO CAPSULES (2 GRAMS TOTAL) BY MOUTH TWO TIMES DAILY What changed: See the new instructions.        Follow-up Information     HCaren Macadam MD Follow up in 1 week(s).   Specialty: Family Medicine Contact information: [place] [phone]        TEvans Lance MD .   Specialty: Cardiology Contact information: [phone]        TSueanne Margarita MD .   Specialty: Cardiology Contact information: [phone]        MLarey Dresser MD .   Specialty: Cardiology Contact information: 1[phone]               Allergies  Allergen Reactions   Avandia [Rosiglitazone Maleate] Other (See Comments)    Congestive heart failure   Cephalexin Anaphylaxis, Swelling and Rash    Extremities swell , throat swelling    Clindamycin Anaphylaxis and Shortness Of Breath    Other reaction(s): esophageal spasms   Clindamycin Hcl Anaphylaxis and Shortness Of Breath   Humalog [Insulin Lispro] Itching     big knots and whelp    Lincomycin Anaphylaxis and Shortness Of Breath   Metformin Other (See Comments)    Congestive heart failure   Novolog [Insulin Aspart] Hives and Itching    Humalog & Novolog big knots and whelp     Penicillins Anaphylaxis, Hives, Swelling and Rash    Did it involve swelling of the face/tongue/throat, SOB, or low BP? Yes Did it involve sudden or severe rash/hives, skin peeling, or any reaction on the inside of your mouth or nose? No Did  you need to seek medical attention at a hospital or doctor's office? Yes When did it  last happen?      Childhood allergy  If all above answers are "NO", may proceed with cephalosporin use.    Rosiglitazone Other (See Comments)    Congestive heart failure   Tizanidine Other (See Comments)    Dizziness, Mental Status Changes, Hallucination     Adhesive [Tape] Other (See Comments)    Tears skin, Please use "paper" tape   Atorvastatin Other (See Comments)    increased LFT's, no muscle weakness, leg pain Muscle and joint pain increased LFT's  Increased LFTs   Cholestyramine Other (See Comments)    Liver Disorder Elevated LFTs     Gentamycin [Gentamicin] Hives, Itching and Rash    Fine red bumps.  Reaction noted post loop recorder implant, where gentamycin was used for irrigation. Topical prep   Iodinated Contrast Media Other (See Comments)    PAIN DURING LYMPHANGIOGRAM '81, NO ALLERGY TO IV CONTRAST.  Has had procedures with Iodine since without problems.    Limonene Hives, Itching, Rash and Other (See Comments)    Reaction noted post loop recorder implant, where gentamycin was used for irrigation. Topical prep   Prednisone Itching and Other (See Comments)    B/P went high, itching all over.  Tolerates with benadryl    Ranexa [Ranolazine] Other (See Comments)    Nausea, dizziness, low blood sugar, tingling in right hand, "blood blisters" on tongue    Rosuvastatin Nausea And Vomiting, Nausea Only and Other (See Comments)    Muscle and joint pain & increased LFTs; tolerates Pravastatin 62m (max) Other reaction(s): elevated LFTs   Simvastatin Other (See Comments)    Increased LFT's    Azelastine Other (See Comments)    Other reaction(s): metallic taste,spitting up blood   Clopidogrel Other (See Comments)    Does not work for patient Medicine was not effective Other reaction(s): Unknown   Lisinopril Diarrhea, Nausea Only and Other (See Comments)   Oseltamivir Phosphate Nausea And  Vomiting   Tizanidine Hcl     Other reaction(s): dizzy, hallucinations   Codeine Nausea And Vomiting   Erythromycin Nausea And Vomiting   Hydrocodone-Acetaminophen Itching, Rash and Other (See Comments)    itching   Latex Itching and Other (See Comments)    I"if wear gloves hands get red ,itchy no prioblem if other wear and touch me"   Neomycin Rash and Other (See Comments)    Redness   Nickel Itching   Ranitidine Nausea Only   Tamiflu [Oseltamivir] Nausea And Vomiting   Tramadol Nausea Only    You were cared for by a hospitalist during your hospital stay. If you have any questions about your discharge medications or the care you received while you were in the hospital after you are discharged, you can call the unit and asked to speak with the hospitalist on call if the hospitalist that took care of you is not available. Once you are discharged, your primary care physician will handle any further medical issues. Please note that no refills for any discharge medications will be authorized once you are discharged, as it is imperative that you return to your primary care physician (or establish a relationship with a primary care physician if you do not have one) for your aftercare needs so that they can reassess your need for medications and monitor your lab values.   Procedures/Studies: DG Abd 2 Views  Result Date: 04/22/2022 CLINICAL DATA:  Ileus. EXAM: ABDOMEN - 2 VIEW COMPARISON:  04/19/2022. FINDINGS: Normal bowel gas pattern. Specifically, no bowel dilation  to suggest obstruction or significant adynamic ileus. No free air. Densities in the right mid to upper abdomen are consistent with gallstones noted along CT dated 04/17/2022. There is opacity at the right lung base consistent with loculated fluid and atelectasis/infection, new compared to the previous CT. IMPRESSION: 1. No evidence of bowel obstruction or generalized adynamic ileus. 2. New opacity at the right lung base consistent with  loculated fluid and atelectasis/pneumonia. Recommend follow-up chest radiograph. Electronically Signed   By: Lajean Manes M.D.   On: 04/22/2022 08:57   DG Abd 1 View  Result Date: 04/19/2022 CLINICAL DATA:  Abdominal pain EXAM: ABDOMEN - 1 VIEW COMPARISON:  Ultrasound abdomen 04/18/2022 and CT abdomen and pelvis 04/17/2022 FINDINGS: The bowel gas pattern is normal. No radio-opaque calculi or other significant radiographic abnormality are seen. IMPRESSION: Negative. Electronically Signed   By: Placido Sou M.D.   On: 04/19/2022 18:59   US Abdomen Limited RUQ (LIVER/GB)  Result Date: 04/18/2022 CLINICAL DATA:  Right upper quadrant abdominal pain EXAM: ULTRASOUND ABDOMEN LIMITED RIGHT UPPER QUADRANT COMPARISON:  None Available. FINDINGS: Gallbladder: Multiple layering gallstones are seen within the gallbladder. The gallbladder, however, is not distended, there is no gallbladder wall thickening, and no pericholecystic fluid is identified. The sonographic Percell Miller sign is reportedly negative. Common bile duct: Diameter: 5 mm in proximal diameter Liver: No focal lesion identified. Within normal limits in parenchymal echogenicity. Portal vein is patent on color Doppler imaging with normal direction of blood flow towards the liver. Other: None. IMPRESSION: Cholelithiasis without sonographic evidence of acute cholecystitis. Electronically Signed   By: Fidela Salisbury M.D.   On: 04/18/2022 04:16   CT ABDOMEN PELVIS WO CONTRAST  Result Date: 04/17/2022 CLINICAL DATA:  Right lower quadrant pain EXAM: CT ABDOMEN AND PELVIS WITHOUT CONTRAST TECHNIQUE: Multidetector CT imaging of the abdomen and pelvis was performed following the standard protocol without IV contrast. RADIATION DOSE REDUCTION: This exam was performed according to the departmental dose-optimization program which includes automated exposure control, adjustment of the mA and/or kV according to patient size and/or use of iterative reconstruction  technique. COMPARISON:  09/14/2021 FINDINGS: Lower chest: Pacer wires noted in the right heart. Densely calcified mitral valve annulus. Aortic calcifications. Scarring at the right lung base. No acute abnormality. Hepatobiliary: Gallstones within the gallbladder. No biliary ductal dilatation or focal hepatic abnormality. Pancreas: No focal abnormality or ductal dilatation. Spleen: No focal abnormality.  Normal size. Adrenals/Urinary Tract: Prior right nephrectomy. Exophytic cyst off the midpole of the left kidney measures 2.2 cm in the stable. No follow-up imaging recommended. Punctate nonobstructing stone in the lower pole of the left kidney. No ureteral stones or hydronephrosis. Adrenal glands and urinary bladder unremarkable. Stomach/Bowel: Normal appendix. Stomach, large and small bowel grossly unremarkable. Vascular/Lymphatic: Diffuse aortic atherosclerosis. No evidence of aneurysm or adenopathy. Reproductive: Prior hysterectomy.  No adnexal masses. Other: No free fluid or free air. Haziness within the mesenteric root is new since prior study. This may reflect mesenteric adenitis/panniculitis. Musculoskeletal: No acute bony abnormality. IMPRESSION: Haziness in the mesenteric root with few prominent mesenteric lymph nodes. This could reflect mesenteric adenitis or panniculitis. Cholelithiasis.  No CT evidence of acute cholecystitis. Aortic atherosclerosis. Electronically Signed   By: Rolm Baptise M.D.   On: 04/17/2022 18:06   CUP PACEART REMOTE DEVICE CHECK  Result Date: 04/06/2022 Scheduled remote reviewed. Normal device function.  AF burden 0.1%, longest 26 min. No OAC due to history of bleeding Next remote 91 days LA    The results of  significant diagnostics from this hospitalization (including imaging, microbiology, ancillary and laboratory) are listed below for reference.     Microbiology: Recent Results (from the past 240 hour(s))  SARS Coronavirus 2 by RT PCR (hospital order, performed in [facility] hospital lab) *cepheid single result test* Anterior Nasal Swab     Status: None   Collection Time: 04/18/22  2:52 PM   Specimen: Anterior Nasal Swab  Result Value Ref Range Status   SARS Coronavirus 2 by RT PCR NEGATIVE NEGATIVE Final    Comment: (NOTE) SARS-CoV-2 target nucleic acids are NOT DETECTED.  The SARS-CoV-2 RNA is generally detectable in upper and lower respiratory specimens during the acute phase of infection. The lowest concentration of SARS-CoV-2 viral copies this assay can detect is 250 copies / mL. A negative result does not preclude SARS-CoV-2 infection and should not be used as the sole basis for treatment or other patient management decisions.  A negative result may occur with improper specimen collection / handling, submission of specimen other than nasopharyngeal swab, presence of viral mutation(s) within the areas targeted by this assay, and inadequate number of viral copies (<250 copies / mL). A negative result must be combined with clinical observations, patient history, and epidemiological information.  Fact Sheet for Patients:   https://www.patel.info/  Fact Sheet for Healthcare Providers: https://hall.com/  This test is not yet approved or  cleared by the [place] FDA and has been authorized for detection and/or diagnosis of SARS-CoV-2 by FDA under an Emergency Use Authorization (EUA).  This EUA will remain in effect (meaning this test can be used) for the duration of the COVID-19 declaration under Section 564(b)(1) of the Act, 21 U.S.C. section 360bbb-3(b)(1), unless the authorization is terminated or revoked sooner.  Performed at [facility], [place] [address] [place], [place] [address]      Labs: BNP (last 3 results) Recent Labs    11/16/21 1058 02/20/22 1239 04/18/22 0355  BNP 93.7 101.5* 053.9*   Basic Metabolic Panel: Recent Labs  Lab 04/20/22 0245 04/21/22 0251  04/22/22 0456 04/23/22 0047 04/24/22 0235  NA 134* 135 139 137 137  K 4.3 3.3* 4.3 3.7 3.8  CL 100 101 101 108 106  CO2 24 24 24 23 22   GLUCOSE 92 147* 82 121* 121*  BUN 65* 51* 41* 35* 43*  CREATININE 2.40* 1.94* 1.78* 1.52* 1.95*  CALCIUM 8.4* 8.3* 9.4 8.7* 8.8*  MG 2.4  --   --   --   --    Liver Function Tests: Recent Labs  Lab 04/17/22 1558 04/20/22 0245 04/21/22 0251 04/22/22 0456  AST 49* 28 44* 40  ALT 60* 31 36 39  ALKPHOS 231* 220* 295* 342*  BILITOT 1.2 0.8 1.0 1.3*  PROT 8.3* 7.1 6.6 7.1  ALBUMIN 3.8 2.9* 2.8* 3.0*   Recent Labs  Lab 04/17/22 1558 04/20/22 0245 04/21/22 0251 04/22/22 0456  LIPASE 47 92* 79* 70*   No results for input(s): "AMMONIA" in the last 168 hours. CBC: Recent Labs  Lab 04/18/22 0355 04/19/22 0311 04/20/22 0245 04/21/22 0251 04/22/22 0456 04/23/22 0047  WBC 8.8 9.1 7.0 7.9 7.4 6.6  NEUTROABS 6.7  --  5.6  --   --   --   HGB 11.2* 9.3* 8.8* 9.7* 10.5* 9.9*  HCT 33.9* 27.9* 26.5* 28.7* 31.1* 29.2*  MCV 89.9 88.9 89.8 87.8 88.9 89.6  PLT 130* 124* 118* 129* 137* 131*   Cardiac Enzymes: Recent Labs  Lab 04/20/22 0245  CKTOTAL  59   BNP: Invalid input(s): "POCBNP" CBG: Recent Labs  Lab 04/23/22 2035 04/24/22 0223 04/24/22 0724 04/24/22 0803 04/24/22 1215  GLUCAP 128* 109* 77 130* 188*   D-Dimer No results for input(s): "DDIMER" in the last 72 hours. Hgb A1c No results for input(s): "HGBA1C" in the last 72 hours. Lipid Profile No results for input(s): "CHOL", "HDL", "LDLCALC", "TRIG", "CHOLHDL", "LDLDIRECT" in the last 72 hours. Thyroid function studies No results for input(s): "TSH", "T4TOTAL", "T3FREE", "THYROIDAB" in the last 72 hours.  Invalid input(s): "FREET3" Anemia work up No results for input(s): "VITAMINB12", "FOLATE", "FERRITIN", "TIBC", "IRON", "RETICCTPCT" in the last 72 hours. Urinalysis    Component Value Date/Time   COLORURINE YELLOW 04/17/2022 1514   APPEARANCEUR CLEAR 04/17/2022 1514    LABSPEC <1.005 (L) 04/17/2022 1514   LABSPEC 1.010 10/17/2015 0808   PHURINE 6.5 04/17/2022 1514   GLUCOSEU NEGATIVE 04/17/2022 1514   GLUCOSEU 500 10/17/2015 0808   HGBUR NEGATIVE 04/17/2022 1514   BILIRUBINUR NEGATIVE 04/17/2022 1514   BILIRUBINUR Negative 10/17/2015 0808   KETONESUR NEGATIVE 04/17/2022 1514   PROTEINUR NEGATIVE 04/17/2022 1514   UROBILINOGEN 0.2 10/17/2015 0808   NITRITE NEGATIVE 04/17/2022 1514   LEUKOCYTESUR NEGATIVE 04/17/2022 1514   LEUKOCYTESUR Negative 10/17/2015 0808   Sepsis Labs Recent Labs  Lab 04/20/22 0245 04/21/22 0251 04/22/22 0456 04/23/22 0047  WBC 7.0 7.9 7.4 6.6   Microbiology Recent Results (from the past 240 hour(s))  SARS Coronavirus 2 by RT PCR (hospital order, performed in [facility] hospital lab) *cepheid single result test* Anterior Nasal Swab     Status: None   Collection Time: 04/18/22  2:52 PM   Specimen: Anterior Nasal Swab  Result Value Ref Range Status   SARS Coronavirus 2 by RT PCR NEGATIVE NEGATIVE Final    Comment: (NOTE) SARS-CoV-2 target nucleic acids are NOT DETECTED.  The SARS-CoV-2 RNA is generally detectable in upper and lower respiratory specimens during the acute phase of infection. The lowest concentration of SARS-CoV-2 viral copies this assay can detect is 250 copies / mL. A negative result does not preclude SARS-CoV-2 infection and should not be used as the sole basis for treatment or other patient management decisions.  A negative result may occur with improper specimen collection / handling, submission of specimen other than nasopharyngeal swab, presence of viral mutation(s) within the areas targeted by this assay, and inadequate number of viral copies (<250 copies / mL). A negative result must be combined with clinical observations, patient history, and epidemiological information.  Fact Sheet for Patients:   https://www.patel.info/  Fact Sheet for Healthcare  Providers: https://hall.com/  This test is not yet approved or  cleared by the [place] FDA and has been authorized for detection and/or diagnosis of SARS-CoV-2 by FDA under an Emergency Use Authorization (EUA).  This EUA will remain in effect (meaning this test can be used) for the duration of the COVID-19 declaration under Section 564(b)(1) of the Act, 21 U.S.C. section 360bbb-3(b)(1), unless the authorization is terminated or revoked sooner.  Performed at [facility], [place] [address], [place], [place] [address]      Time coordinating discharge:  I have spent 35 minutes face to face with the patient and on the ward discussing the patients care, assessment, plan and disposition with other care givers. >50% of the time was devoted counseling the patient about the risks and benefits of treatment/Discharge disposition and coordinating care.   SIGNED:   Damita Lack, MD  [facility] 04/24/2022,  1:38 PM   If 7PM-7AM, please contact night-coverage

## 2022-04-24 NOTE — Progress Notes (Addendum)
Daily Rounding Note  04/24/2022, 12:38 PM  LOS: 6 days   SUBJECTIVE:   Chief complaint:   Abdominal bloating, discomfort, obstipation  Patient had small bowel movements a couple of times yesterday and small bowel movements this morning.  Bloating is much better.  She is tolerating solid food.  Packing up in anticipation of discharge.  OBJECTIVE:         Vital signs in last 24 hours:    Temp:  [98 F (36.7 C)-99.5 F (37.5 C)] 98 F (36.7 C) (10/03 1036) Pulse Rate:  [70-78] 78 (10/03 1036) Resp:  [17-18] 17 (10/03 1036) BP: (109-132)/(37-56) 109/48 (10/03 1036) SpO2:  [99 %] 99 % (10/03 1036) Last BM Date : 04/23/22 Filed Weights   04/19/22 1547 04/22/22 0530 04/23/22 0500  Weight: 50.6 kg 49.9 kg 52.1 kg   General: NAD.  Looks well.  Comfortable. Heart: RRR. Chest: Clear bilaterally.  No labored breathing or cough. Abdomen: Soft.  Active bowel sounds.  No distention.  No tenderness Extremities: No CCE. Neuro/Psych: Calm, pleasant, cooperative.  Intake/Output from previous day: No intake/output data recorded.  Intake/Output this shift: Total I/O In: 240 [P.O.:240] Out: -   Lab Results: Recent Labs    04/22/22 0456 04/23/22 0047  WBC 7.4 6.6  HGB 10.5* 9.9*  HCT 31.1* 29.2*  PLT 137* 131*   BMET Recent Labs    04/22/22 0456 04/23/22 0047 04/24/22 0235  NA 139 137 137  K 4.3 3.7 3.8  CL 101 108 106  CO2 24 23 22   GLUCOSE 82 121* 121*  BUN 41* 35* 43*  CREATININE 1.78* 1.52* 1.95*  CALCIUM 9.4 8.7* 8.8*   LFT Recent Labs    04/22/22 0456  PROT 7.1  ALBUMIN 3.0*  AST 40  ALT 39  ALKPHOS 342*  BILITOT 1.3*  BILIDIR 0.5*  IBILI 0.8   PT/INR No results for input(s): "LABPROT", "INR" in the last 72 hours. Hepatitis Panel No results for input(s): "HEPBSAG", "HCVAB", "HEPAIGM", "HEPBIGM" in the last 72 hours.  Studies/Results: No results found.  Scheduled Meds:  aspirin EC  81 mg  Oral QHS   enoxaparin (LOVENOX) injection  30 mg Subcutaneous Q24H   escitalopram  5 mg Oral Daily   famotidine  10 mg Oral Daily   febuxostat  40 mg Oral QHS   feeding supplement (GLUCERNA SHAKE)  237 mL Oral TID BM   insulin pump   Subcutaneous TID WC, HS, 0200   isosorbide mononitrate  120 mg Oral QHS   latanoprost  1 drop Both Eyes QHS   metoprolol succinate  25 mg Oral QHS   polycarbophil  625 mg Oral Daily   polyethylene glycol  17 g Oral BID   pravastatin  10 mg Oral Daily   rOPINIRole  0.25 mg Oral QHS   senna-docusate  1 tablet Oral BID   sodium chloride flush  3 mL Intravenous Q12H   ticagrelor  60 mg Oral BID   Continuous Infusions: PRN Meds:.acetaminophen **OR** acetaminophen, ALPRAZolam, bisacodyl, fentaNYL (SUBLIMAZE) injection, hydrALAZINE, nitroGLYCERIN, ondansetron **OR** ondansetron (ZOFRAN) IV, oxyCODONE, polyethylene glycol   ASSESMENT:   Obstipation, abdominal bloating, abdominal pain.   CT wo contrast: Gallstones, previous right nephrectomy, previous hysterectomy.  New haziness at mesenteric root raising concern for mesenteric adenitis.   Ultrasound with multiple, uncomplicated gallstones, no ductal dilatation.    Several bowel movements following administration of enemas and completion of bowel prep. Smaller BM's followed yesterday and  today.  Bloating improved.   04/22/22 2 view abdominal films showed no obstruction or ileus.  Incidentally did show new right lung base opacity/loculated fluid/atelectasis/pneumonia.  Elevated alkaline phosphatase.  GGT elevated 169. 5 Prime nucleotidase pending.  Hx NASH and suspected cirrhosis.  . Liver biopsy 1999: steatohepatitis.   FibroScan liver stiffness measurement of 28.7  kPa.   At 02/2022 office visit with [facility]/hepatology felt likely to have cirrhosis.   Ultrasound 04/18/2022 shows liver parenchyma with normal echo pattern.      S/p tissue AVR.  On Brilinta for hx PAF .        Hx renal cell carcinoma/right  nephrectomy.  Hx breast cancer.   IDDM.  Insulin pump in place    Hx Hodgkins dz, treated w radiation.       Remote duodenal ulcer.       Hx IDA.  Previous requirements for parenteral iron and blood transfusion.  EGD and capsule endoscopy normal Sept 2014, colonoscopy normal Dec 2013.  Negative celiac tests. Hgb 9.9, MCV 89.    PLAN   From GI standpoint she is cleared for discharge.  If she has persistent issues with constipation or bloating might benefit from [place].  For now would continue Senokot and MiraLAX and back off on these if she develops significant stool frequency.    Azucena Freed  04/24/2022, 12:38 PM Phone [phone]

## 2022-04-25 ENCOUNTER — Telehealth (HOSPITAL_COMMUNITY): Payer: Self-pay | Admitting: *Deleted

## 2022-04-25 ENCOUNTER — Other Ambulatory Visit: Payer: Self-pay | Admitting: Family Medicine

## 2022-04-25 ENCOUNTER — Other Ambulatory Visit (HOSPITAL_COMMUNITY): Payer: Self-pay | Admitting: *Deleted

## 2022-04-25 ENCOUNTER — Ambulatory Visit
Admission: RE | Admit: 2022-04-25 | Discharge: 2022-04-25 | Disposition: A | Discharge status: Home / Self Care | Payer: Medicare PPO | Source: Ambulatory Visit | Attending: Family Medicine | Admitting: Family Medicine

## 2022-04-25 DIAGNOSIS — R0600 Dyspnea, unspecified: Secondary | ICD-10-CM | POA: Diagnosis not present

## 2022-04-25 DIAGNOSIS — R079 Chest pain, unspecified: Secondary | ICD-10-CM | POA: Diagnosis not present

## 2022-04-25 DIAGNOSIS — I509 Heart failure, unspecified: Secondary | ICD-10-CM | POA: Diagnosis not present

## 2022-04-25 DIAGNOSIS — Z9289 Personal history of other medical treatment: Secondary | ICD-10-CM | POA: Diagnosis not present

## 2022-04-25 DIAGNOSIS — F419 Anxiety disorder, unspecified: Secondary | ICD-10-CM | POA: Diagnosis not present

## 2022-04-25 LAB — NUCLEOTIDASE, 5', BLOOD: 5-Nucleotidase: 22 IU/L — ABNORMAL HIGH (ref 0–11)

## 2022-04-25 MED ORDER — SPIRONOLACTONE 25 MG PO TABS: 25.0000 mg | ORAL_TABLET | Freq: Every day | ORAL | 3 refills | Status: DC

## 2022-04-25 NOTE — Telephone Encounter (Signed)
Pt left vm stating she was recently discharged from the hospital and wanted to know if she is supposed to take spironolactone. It is not on her med list and no mention of it on the discharge summary  Routed to [place]

## 2022-04-25 NOTE — Telephone Encounter (Signed)
Pt said you had her stop [place]. Pt asks if she should restart spiro.

## 2022-04-25 NOTE — Telephone Encounter (Signed)
Not sure that Car[place] Clamp was really the cause of her symptoms (had mesenteric adenitis it looks like).  Would give her the option of trying [place] again at 10 mg daily or going back to spironolactone 25 mg daily.  BMET in 10 days either way.

## 2022-04-25 NOTE — Telephone Encounter (Signed)
She is on [place] which replaces spironolactone.

## 2022-04-25 NOTE — Telephone Encounter (Signed)
Spoke with pt she is aware.

## 2022-04-26 DIAGNOSIS — E10649 Type 1 diabetes mellitus with hypoglycemia without coma: Secondary | ICD-10-CM | POA: Diagnosis not present

## 2022-04-27 ENCOUNTER — Ambulatory Visit
Admission: RE | Admit: 2022-04-27 | Discharge: 2022-04-27 | Disposition: A | Discharge status: Home / Self Care | Payer: Medicare PPO | Source: Ambulatory Visit | Attending: Family Medicine | Admitting: Family Medicine

## 2022-04-27 ENCOUNTER — Other Ambulatory Visit: Payer: Self-pay | Admitting: Family Medicine

## 2022-04-27 DIAGNOSIS — J90 Pleural effusion, not elsewhere classified: Secondary | ICD-10-CM | POA: Diagnosis not present

## 2022-04-27 DIAGNOSIS — R9389 Abnormal findings on diagnostic imaging of other specified body structures: Secondary | ICD-10-CM

## 2022-04-27 DIAGNOSIS — R911 Solitary pulmonary nodule: Secondary | ICD-10-CM | POA: Diagnosis not present

## 2022-04-27 DIAGNOSIS — I700 Atherosclerosis of aorta: Secondary | ICD-10-CM | POA: Diagnosis not present

## 2022-05-01 ENCOUNTER — Other Ambulatory Visit: Payer: Self-pay | Admitting: Cardiology

## 2022-05-01 NOTE — Telephone Encounter (Signed)
Pt's pharmacy is requesting a refill on ropinirole. Would Dr. Radford Pax like to refill this medication? Please address

## 2022-05-09 ENCOUNTER — Inpatient Hospital Stay: Payer: Medicare PPO | Attending: Hematology

## 2022-05-09 DIAGNOSIS — Z808 Family history of malignant neoplasm of other organs or systems: Secondary | ICD-10-CM | POA: Diagnosis not present

## 2022-05-09 DIAGNOSIS — D696 Thrombocytopenia, unspecified: Secondary | ICD-10-CM

## 2022-05-09 DIAGNOSIS — Z9012 Acquired absence of left breast and nipple: Secondary | ICD-10-CM | POA: Insufficient documentation

## 2022-05-09 DIAGNOSIS — N184 Chronic kidney disease, stage 4 (severe): Secondary | ICD-10-CM | POA: Insufficient documentation

## 2022-05-09 DIAGNOSIS — Z90710 Acquired absence of both cervix and uterus: Secondary | ICD-10-CM | POA: Insufficient documentation

## 2022-05-09 DIAGNOSIS — Z803 Family history of malignant neoplasm of breast: Secondary | ICD-10-CM | POA: Insufficient documentation

## 2022-05-09 DIAGNOSIS — I5032 Chronic diastolic (congestive) heart failure: Secondary | ICD-10-CM | POA: Insufficient documentation

## 2022-05-09 DIAGNOSIS — D890 Polyclonal hypergammaglobulinemia: Secondary | ICD-10-CM | POA: Insufficient documentation

## 2022-05-09 DIAGNOSIS — Z801 Family history of malignant neoplasm of trachea, bronchus and lung: Secondary | ICD-10-CM | POA: Insufficient documentation

## 2022-05-09 DIAGNOSIS — I130 Hypertensive heart and chronic kidney disease with heart failure and stage 1 through stage 4 chronic kidney disease, or unspecified chronic kidney disease: Secondary | ICD-10-CM | POA: Insufficient documentation

## 2022-05-09 DIAGNOSIS — C641 Malignant neoplasm of right kidney, except renal pelvis: Secondary | ICD-10-CM | POA: Insufficient documentation

## 2022-05-09 LAB — CBC WITH DIFFERENTIAL/PLATELET
Abs Immature Granulocytes: 0.05 10*3/uL (ref 0.00–0.07)
Basophils Absolute: 0.1 10*3/uL (ref 0.0–0.1)
Basophils Relative: 2 %
Eosinophils Absolute: 0.2 10*3/uL (ref 0.0–0.5)
Eosinophils Relative: 3 %
HCT: 37.2 % (ref 36.0–46.0)
Hemoglobin: 11.5 g/dL — ABNORMAL LOW (ref 12.0–15.0)
Immature Granulocytes: 1 %
Lymphocytes Relative: 19 %
Lymphs Abs: 1.4 10*3/uL (ref 0.7–4.0)
MCH: 29.3 pg (ref 26.0–34.0)
MCHC: 30.9 g/dL (ref 30.0–36.0)
MCV: 94.7 fL (ref 80.0–100.0)
Monocytes Absolute: 0.5 10*3/uL (ref 0.1–1.0)
Monocytes Relative: 7 %
Neutro Abs: 5.1 10*3/uL (ref 1.7–7.7)
Neutrophils Relative %: 68 %
Platelets: 197 10*3/uL (ref 150–400)
RBC: 3.93 MIL/uL (ref 3.87–5.11)
RDW: 14.1 % (ref 11.5–15.5)
WBC: 7.4 10*3/uL (ref 4.0–10.5)
nRBC: 0 % (ref 0.0–0.2)

## 2022-05-09 LAB — COMPREHENSIVE METABOLIC PANEL
ALT: 23 U/L (ref 0–44)
AST: 26 U/L (ref 15–41)
Albumin: 4.1 g/dL (ref 3.5–5.0)
Alkaline Phosphatase: 276 U/L — ABNORMAL HIGH (ref 38–126)
Anion gap: 8 (ref 5–15)
BUN: 53 mg/dL — ABNORMAL HIGH (ref 8–23)
CO2: 26 mmol/L (ref 22–32)
Calcium: 10.4 mg/dL — ABNORMAL HIGH (ref 8.9–10.3)
Chloride: 104 mmol/L (ref 98–111)
Creatinine, Ser: 1.62 mg/dL — ABNORMAL HIGH (ref 0.44–1.00)
GFR, Estimated: 34 mL/min — ABNORMAL LOW (ref >60)
Glucose, Bld: 134 mg/dL — ABNORMAL HIGH (ref 70–99)
Potassium: 4.2 mmol/L (ref 3.5–5.1)
Sodium: 138 mmol/L (ref 135–145)
Total Bilirubin: 0.8 mg/dL (ref 0.3–1.2)
Total Protein: 9.3 g/dL — ABNORMAL HIGH (ref 6.5–8.1)

## 2022-05-10 LAB — KAPPA/LAMBDA LIGHT CHAINS
Kappa free light chain: 123.7 mg/L — ABNORMAL HIGH (ref 3.3–19.4)
Kappa, lambda light chain ratio: 2.17 — ABNORMAL HIGH (ref 0.26–1.65)
Lambda free light chains: 57.1 mg/L — ABNORMAL HIGH (ref 5.7–26.3)

## 2022-05-11 LAB — PROTEIN ELECTROPHORESIS, SERUM
A/G Ratio: 0.9 (ref 0.7–1.7)
Albumin ELP: 3.8 g/dL (ref 2.9–4.4)
Alpha-1-Globulin: 0.3 g/dL (ref 0.0–0.4)
Alpha-2-Globulin: 1.1 g/dL — ABNORMAL HIGH (ref 0.4–1.0)
Beta Globulin: 1.1 g/dL (ref 0.7–1.3)
Gamma Globulin: 1.7 g/dL (ref 0.4–1.8)
Globulin, Total: 4.3 g/dL — ABNORMAL HIGH (ref 2.2–3.9)
M-Spike, %: 0.2 g/dL — ABNORMAL HIGH
Total Protein ELP: 8.1 g/dL (ref 6.0–8.5)

## 2022-05-14 ENCOUNTER — Other Ambulatory Visit (HOSPITAL_COMMUNITY): Payer: Medicare PPO

## 2022-05-15 LAB — IMMUNOFIXATION ELECTROPHORESIS
IgA: 385 mg/dL — ABNORMAL HIGH (ref 87–352)
IgG (Immunoglobin G), Serum: 1657 mg/dL — ABNORMAL HIGH (ref 586–1602)
IgM (Immunoglobulin M), Srm: 198 mg/dL (ref 26–217)
Total Protein ELP: 8.5 g/dL (ref 6.0–8.5)

## 2022-05-20 DIAGNOSIS — L03116 Cellulitis of left lower limb: Secondary | ICD-10-CM | POA: Diagnosis not present

## 2022-05-21 ENCOUNTER — Inpatient Hospital Stay: Payer: Medicare PPO | Admitting: Hematology

## 2022-05-21 VITALS — HR 81 | Temp 98.1°F | Resp 17 | Ht 62.0 in | Wt 109.7 lb

## 2022-05-21 DIAGNOSIS — D696 Thrombocytopenia, unspecified: Secondary | ICD-10-CM

## 2022-05-21 DIAGNOSIS — Z9012 Acquired absence of left breast and nipple: Secondary | ICD-10-CM | POA: Diagnosis not present

## 2022-05-21 DIAGNOSIS — N184 Chronic kidney disease, stage 4 (severe): Secondary | ICD-10-CM | POA: Diagnosis not present

## 2022-05-21 DIAGNOSIS — Z90710 Acquired absence of both cervix and uterus: Secondary | ICD-10-CM | POA: Diagnosis not present

## 2022-05-21 DIAGNOSIS — D890 Polyclonal hypergammaglobulinemia: Secondary | ICD-10-CM | POA: Diagnosis not present

## 2022-05-21 DIAGNOSIS — Z801 Family history of malignant neoplasm of trachea, bronchus and lung: Secondary | ICD-10-CM | POA: Diagnosis not present

## 2022-05-21 DIAGNOSIS — C641 Malignant neoplasm of right kidney, except renal pelvis: Secondary | ICD-10-CM | POA: Diagnosis not present

## 2022-05-21 DIAGNOSIS — I5032 Chronic diastolic (congestive) heart failure: Secondary | ICD-10-CM | POA: Diagnosis not present

## 2022-05-21 DIAGNOSIS — I130 Hypertensive heart and chronic kidney disease with heart failure and stage 1 through stage 4 chronic kidney disease, or unspecified chronic kidney disease: Secondary | ICD-10-CM | POA: Diagnosis not present

## 2022-05-21 NOTE — Progress Notes (Signed)
[name] [name], [place] [id number]   CLINIC:  Medical Oncology/Hematology  PCP:  [name], [place] / [place] [address]  [phone]  REASON FOR VISIT:  Follow-up for thrombocytopenia  PRIOR THERAPY: none  CURRENT THERAPY: surveillance  INTERVAL HISTORY:  [name], a 71 y.o. female, seen for follow-up of polyclonal gammopathy and thrombocytopenia.  Denies any fevers, night sweats or weight loss in the last 6 months.  Denies any new onset bone pains.  REVIEW OF SYSTEMS:  Review of Systems  Respiratory:  Positive for cough and shortness of breath (with exertion).   Gastrointestinal:  Positive for constipation and nausea.  Neurological:  Positive for dizziness.  Psychiatric/Behavioral:  Positive for depression. The patient is nervous/anxious.   All other systems reviewed and are negative.   PAST MEDICAL/SURGICAL HISTORY:  Past Medical History:  Diagnosis Date   Anxiety    Aortic stenosis    a. bicuspid aortic valve; mean gradient of 20 mmHg in 2/10; b. s/p bioprosth AVR (19-mm Edwards pericardial valve) with a redo coronary artery bypass graft procedure in 07/2009; postoperative Dressler's syndrome     Arthritis    Breast cancer ([place])    Carcinoma, renal cell ([place]) 05/2007   Laparoscopic right nephrectomy   Chronic diastolic CHF (congestive heart failure) ([place])    a. 9.2014 EchoP EF 60-65%, no rwma, bioprosth AVR, mean gradient of 30mHg, mild to mod MR, PASP 578mg.   CKD (chronic kidney disease), stage IV (HCC)    Colitis, ischemic ([facility]   Coronary artery disease    a. initial CABG with SVG-RCA in 12/05. b. redo SVG-RCA and bioprosthetic AVR in 1/11. d. 9/14 - PCI with DES to distal LM/proximal LAD was done rather than bypass as she would have been a poor candidate for redo sternotomy. d. S/p DES to prox LM 05/2014.   Diabetes mellitus 03/2010    TYPE II: Hemoglobin A1c of 7.4 in  03/2010; 8.4 in  06/2010; treated with insulin pump   Duodenal ulcer    Remote; H. pylori positive   Gastroparesis due to DM ([facility]/04/2012   Glaucoma 1998   Hodgkin's disease ([facility]   Mantle radiation therapy   Hyperlipidemia    Hyperplastic gastric polyp 06/23/2012   Hypertension    Iron deficiency anemia    a. 03/2013 EGD: essentially normal. Possible slow GIB.   Migraines    NASH (nonalcoholic steatohepatitis) 1999   -biopsy in 1999   OSA treated with BiPAP    Osteoporosis    Pacemaker    six sinus syndrome   PAF (paroxysmal atrial fibrillation) ([facility]   a.  h/o brief episodes noted on ppm interrogation. b. given GI bleeding and need to be on both ASA 81 and Brilinta, risks likely outweigh benefits of anticoagulation.    Small bowel obstruction (HCC)    Recurrent; resolved after resection of a lipoma   Syncope    a. H/o recurrent syncope with pauses on loop recorder s/p Medtronic pacemaker 07/2012.   Past Surgical History:  Procedure Laterality Date   ABDOMINAL HYSTERECTOMY  2000   AORTIC VALVE REPLACEMENT  2011   Aortic valve replacement surgery with a 19 mm bioprosthetic device post redo CABG January 2011.   BONE MARROW BIOPSY     BREAST EXCISIONAL BIOPSY     benign, bilateral   CARDIAC CATHETERIZATION     CERVICAL BIOPSY     COLONOSCOPY  Multiple with adenomatous polyps   COLONOSCOPY  06/23/2012   Procedure: COLONOSCOPY;  Surgeon: Gatha Mayer, MD;  Location: [facility];  Service: Endoscopy;  Laterality: N/A;   CORONARY ARTERY BYPASS GRAFT  2005, 2011   CABG-most recent SVG to RCA-12/05; RCA occluded with patent graft in 2006; Redo bypass in 07/2009   ESOPHAGOGASTRODUODENOSCOPY     ESOPHAGOGASTRODUODENOSCOPY  06/23/2012   Procedure: ESOPHAGOGASTRODUODENOSCOPY (EGD);  Surgeon: Gatha Mayer, MD;  Location: [facility];  Service: Endoscopy;  Laterality: N/A;   ESOPHAGOGASTRODUODENOSCOPY N/A 04/06/2013   Procedure: ESOPHAGOGASTRODUODENOSCOPY (EGD);  Surgeon: Irene Shipper,  MD;  Location: [facility] ENDOSCOPY;  Service: Endoscopy;  Laterality: N/A;   EXPLORATORY LAPAROTOMY W/ BOWEL RESECTION     Small bowel resection of lipoma   GIVENS CAPSULE STUDY N/A 04/30/2013   Procedure: GIVENS CAPSULE STUDY;  Surgeon: Gatha Mayer, MD;  Location: [facility];  Service: Endoscopy;  Laterality: N/A;   LEFT AND RIGHT HEART CATHETERIZATION WITH CORONARY ANGIOGRAM N/A 04/07/2013   Procedure: LEFT AND RIGHT HEART CATHETERIZATION WITH CORONARY ANGIOGRAM;  Surgeon: Larey Dresser, MD;  Location: [facility] CATH LAB;  Service: Cardiovascular;  Laterality: N/A;   LEFT ATRIAL APPENDAGE OCCLUSION N/A 02/16/2016   Watchman not placed - nickel allergy   LEFT HEART CATHETERIZATION WITH CORONARY/GRAFT ANGIOGRAM N/A 06/07/2014   Procedure: LEFT HEART CATHETERIZATION WITH Beatrix Fetters;  Surgeon: Burnell Blanks, MD;  Location: [facility] CATH LAB;  Service: Cardiovascular;  Laterality: N/A;   LIVER BIOPSY     LOOP RECORDER IMPLANT N/A 12/10/2011   Procedure: LOOP RECORDER IMPLANT;  Surgeon: Evans Lance, MD;  Location: [facility] CATH LAB;  Service: Cardiovascular;  Laterality: N/A;   MALONEY DILATION  06/23/2012   Procedure: [name] DILATION;  Surgeon: Gatha Mayer, MD;  Location: [facility] ENDOSCOPY;  Service: Endoscopy;;  58 fr   MASTECTOMY Left 05/17/2015   total    NEPHRECTOMY  2008   Laparoscopic right nephrectomy, renal cell carcinoma   PACEMAKER INSERTION  08/21/2012   Medtronic Adapta L dual-chamber pacemaker   PERCUTANEOUS CORONARY STENT INTERVENTION (PCI-S) N/A 04/09/2013   Procedure: PERCUTANEOUS CORONARY STENT INTERVENTION (PCI-S);  Surgeon: Burnell Blanks, MD;  Location[facility] CATH LAB;  Service: Cardiovascular;  Laterality: N/A;   PERMANENT PACEMAKER INSERTION N/A 08/21/2012   Procedure: PERMANENT PACEMAKER INSERTION;  Surgeon: Evans Lance, MD;  Location: [facility] CATH LAB;  Service: Cardiovascular;  Laterality: N/A;   RIGHT HEART CATH N/A 03/18/2019   Procedure: RIGHT HEART CATH;   Surgeon: Larey Dresser, MD;  Location: [facility] CV LAB;  Service: Cardiovascular;  Laterality: N/A;   RIGHT HEART CATH AND CORONARY/GRAFT ANGIOGRAPHY N/A 04/19/2017   Procedure: RIGHT HEART CATH AND CORONARY/GRAFT ANGIOGRAPHY;  Surgeon: Larey Dresser, MD;  Location: [facility] CV LAB;  Service: Cardiovascular;  Laterality: N/A;   TEE WITHOUT CARDIOVERSION N/A 12/07/2015   Procedure: TRANSESOPHAGEAL ECHOCARDIOGRAM (TEE);  Surgeon: Larey Dresser, MD;  Location: [facility];  Service: Cardiovascular;  Laterality: N/A;   TOTAL ABDOMINAL HYSTERECTOMY W/ BILATERAL SALPINGOOPHORECTOMY  2000   endometriosis and ovarian cysts   TOTAL MASTECTOMY Left 05/17/2015   Procedure: LEFT TOTAL MASTECTOMY;  Surgeon: Rolm Bookbinder, MD;  Location: [facility];  Service: General;  Laterality: Left;   TUMOR EXCISION  1981   removal of Hodgkins lymphoma    SOCIAL HISTORY:  Social History   Socioeconomic History   Marital status: Married    Spouse name: Not on file   Number of children: 2   Years  of education: Not on file   Highest education level: Doctorate  Occupational History   Occupation: retired    Comment: elementary principal  Tobacco Use   Smoking status: Never   Smokeless tobacco: Never  Vaping Use   Vaping Use: Never used  Substance and Sexual Activity   Alcohol use: No    Comment: social, 24s   Drug use: Never   Sexual activity: Yes  Other Topics Concern   Not on file  Social History Narrative   Retired Youth worker principal, married    Started disability September 2013      Has one daughter in [place], works at [employer]. Has a son, Aaron Edelman, who is Retail buyer.   Eats all food groups.   Wear seatbelt.    Attends church. [place], [place]. [employer].    Social Determinants of Health   Financial Resource Strain: Not on file  Food Insecurity: Not on file  Transportation Needs: Not on file  Physical Activity: Not on file  Stress: Not on file  Social Connections: Not on  file  Intimate Partner Violence: Not on file    FAMILY HISTORY:  Family History  Problem Relation Age of Onset   Heart attack Mother        d.61   Heart disease Mother    Anal fissures Mother    Heart attack Father        d.71   Diabetes Father    Heart disease Father    Lung cancer Sister        d.61 metastases to brain. History of smoking. Maternal half-sister.   Cancer Sister 30       unspecified gynecologic cancer (either cervical or ovarian) in her 24s.   Breast cancer Sister 62       d.42 from metastatic disease. Paternal half-sister.   Hypertension Brother    Glaucoma Brother    Parkinson's disease Paternal Aunt    Heart attack Maternal Grandmother    Cancer Maternal Grandfather 16       d.52s oral cancer   Colon cancer Neg Hx    Stomach cancer Neg Hx    Parkinsonism Neg Hx     CURRENT MEDICATIONS:  Current Outpatient Medications  Medication Sig Dispense Refill   acyclovir ointment (ZOVIRAX) 5 % Apply 1 application topically every 3 (three) hours as needed (fever blister/cold sores).     ALPRAZolam (XANAX) 0.5 MG tablet Take 1 tablet (0.5 mg total) by mouth 2 (two) times daily as needed for anxiety. 60 tablet 1   APIDRA 100 UNIT/ML injection Inject 40 Units into the skin as directed. INFUSE THROUGH INSULIN PUMP UTD, Bolus depends on carb intake  2   aspirin EC 81 MG tablet Take 81 mg by mouth at bedtime.     bisacodyl (DULCOLAX) 5 MG EC tablet Take 1 tablet (5 mg total) by mouth daily as needed for moderate constipation. 30 tablet 0   BRILINTA 60 MG TABS tablet TAKE ONE TABLET (60MG TOTAL) BY MOUTH TWICE DAILY (Patient taking differently: Take 60 mg by mouth 2 (two) times daily.) 180 tablet 3   cetirizine (ZYRTEC) 10 MG tablet Take 10 mg by mouth daily.     clobetasol (OLUX) 0.05 % topical foam Apply 1 application topically 2 (two) times daily as needed (skin flaking).     Coenzyme Q10 (CO Q 10) 100 MG CAPS Take 100 mg by mouth at bedtime.      colchicine 0.6 MG  tablet Take 0.6 mg  by mouth daily as needed (gout flare).     denosumab (PROLIA) 60 MG/ML SOSY injection Inject 60 mg into the skin every 6 (six) months.     diclofenac Sodium (VOLTAREN) 1 % GEL Apply 2 g topically 4 (four) times daily.     doxycycline (VIBRA-TABS) 100 MG tablet Take 100 mg by mouth 2 (two) times daily.     escitalopram (LEXAPRO) 5 MG tablet Take 5 mg by mouth daily.     famotidine (PEPCID) 40 MG tablet Take 40 mg by mouth daily. Takes a second tablet at night if needed.     glucagon (GLUCAGON EMERGENCY) 1 MG injection Inject 1 mg into the vein once as needed (low blood sugar).     isosorbide mononitrate (IMDUR) 120 MG 24 hr tablet TAKE ONE TABLET (120MG TOTAL) BY MOUTH DAILY (Patient taking differently: Take 120 mg by mouth daily.) 90 tablet 3   latanoprost (XALATAN) 0.005 % ophthalmic solution Place 1 drop into both eyes at bedtime.     metolazone (ZAROXOLYN) 2.5 MG tablet Take 2.5 mg (1 tab) once as directed by CHF clinic. Call before taking [phone] 3 tablet 0   metoprolol succinate (TOPROL XL) 25 MG 24 hr tablet Take 1 tablet (25 mg total) by mouth daily. 90 tablet 3   mometasone (NASONEX) 50 MCG/ACT nasal spray Place 2 sprays into the nose daily as needed (allergy).     mupirocin cream (BACTROBAN) 2 % Apply 1 application topically 2 (two) times daily as needed. (Patient taking differently: Apply 1 application  topically 2 (two) times daily as needed (pain).) 90 g 0   nitroGLYCERIN (NITROLINGUAL) 0.4 MG/SPRAY spray PLACE 1 SPRAY UNDER THE TONGUE EVERY 5 MINUTES FOR 3 DOSES AS NEEDED FOR CHEST PAIN. (Patient taking differently: Place 1 spray under the tongue every 5 (five) minutes x 3 doses as needed for chest pain.) 14.7 g 0   polyethylene glycol powder (GLYCOLAX/MIRALAX) 17 GM/SCOOP powder Take 17 g by mouth 2 (two) times daily. 238 g 1   pravastatin (PRAVACHOL) 10 MG tablet TAKE ONE TABLET (10 MG TOTAL) BY MOUTH DAILY. (Patient taking differently: Take 10 mg by mouth  daily.) 90 tablet 3   REPATHA SURECLICK 419 MG/ML SOAJ INJECT 140MG INTO THE SKIN EVERY 14 DAYS (Patient taking differently: Inject 140 mg into the skin every 14 (fourteen) days.) 6 mL 3   rOPINIRole (REQUIP) 0.25 MG tablet TAKE ONE TABLET (0.25MG TOTAL) BY MOUTH DAILY. TAKE TWO HOURS PRIOR TO BEDTIME EVERY NIGHT. 30 tablet 0   senna-docusate (SENOKOT-S) 8.6-50 MG tablet Take 1 tablet by mouth 2 (two) times daily. 60 tablet 0   silver nitrate applicators 37-90 % applicator Use as needed to stop superficial skin bleeding. (Patient taking differently: Apply 1 Stick topically as needed for bleeding. Use as needed to stop superficial skin bleeding.) 10 each 0   spironolactone (ALDACTONE) 25 MG tablet Take 1 tablet (25 mg total) by mouth daily. 90 tablet 3   torsemide (DEMADEX) 20 MG tablet Take 2 tablets by mouth daily alternating with 69m daily. (Patient taking differently: Take 20-40 mg by mouth See admin instructions. Take 2 tablets by mouth daily alternating with 20 mg) 30 tablet 6   ULORIC 40 MG tablet Take 1 tablet (40 mg total) by mouth at bedtime. 90 tablet 3   VASCEPA 1 g capsule TAKE TWO CAPSULES (2 GRAMS TOTAL) BY MOUTH TWO TIMES DAILY (Patient taking differently: Take 2 g by mouth 2 (two) times daily.) 360 capsule 3  Wheat Dextrin (BENEFIBER) POWD Take 1 Dose by mouth daily.      No current facility-administered medications for this visit.    ALLERGIES:  Allergies  Allergen Reactions   Avandia [Rosiglitazone Maleate] Other (See Comments)    Congestive heart failure   Cephalexin Anaphylaxis, Swelling and Rash    Extremities swell , throat swelling    Clindamycin Anaphylaxis and Shortness Of Breath    Other reaction(s): esophageal spasms   Clindamycin Hcl Anaphylaxis and Shortness Of Breath   Humalog [Insulin Lispro] Itching     big knots and whelp    Lincomycin Anaphylaxis and Shortness Of Breath   Metformin Other (See Comments)    Congestive heart failure   Novolog [Insulin  Aspart] Hives and Itching    Humalog & Novolog big knots and whelp     Penicillins Anaphylaxis, Hives, Swelling and Rash    Did it involve swelling of the face/tongue/throat, SOB, or low BP? Yes Did it involve sudden or severe rash/hives, skin peeling, or any reaction on the inside of your mouth or nose? No Did you need to seek medical attention at a hospital or doctor's office? Yes When did it last happen?      Childhood allergy  If all above answers are "NO", may proceed with cephalosporin use.    Rosiglitazone Other (See Comments)    Congestive heart failure   Tizanidine Other (See Comments)    Dizziness, Mental Status Changes, Hallucination     Adhesive [Tape] Other (See Comments)    Tears skin, Please use "paper" tape   Atorvastatin Other (See Comments)    increased LFT's, no muscle weakness, leg pain Muscle and joint pain increased LFT's  Increased LFTs   Cholestyramine Other (See Comments)    Liver Disorder Elevated LFTs     Gentamycin [Gentamicin] Hives, Itching and Rash    Fine red bumps.  Reaction noted post loop recorder implant, where gentamycin was used for irrigation. Topical prep   Iodinated Contrast Media Other (See Comments)    PAIN DURING LYMPHANGIOGRAM '81, NO ALLERGY TO IV CONTRAST.  Has had procedures with Iodine since without problems.    Limonene Hives, Itching, Rash and Other (See Comments)    Reaction noted post loop recorder implant, where gentamycin was used for irrigation. Topical prep   Prednisone Itching and Other (See Comments)    B/P went high, itching all over.  Tolerates with benadryl    Ranexa [Ranolazine] Other (See Comments)    Nausea, dizziness, low blood sugar, tingling in right hand, "blood blisters" on tongue    Rosuvastatin Nausea And Vomiting, Nausea Only and Other (See Comments)    Muscle and joint pain & increased LFTs; tolerates Pravastatin 59m (max) Other reaction(s): elevated LFTs   Simvastatin Other (See Comments)     Increased LFT's    Azelastine Other (See Comments)    Other reaction(s): metallic taste,spitting up blood   Clopidogrel Other (See Comments)    Does not work for patient Medicine was not effective Other reaction(s): Unknown   Lisinopril Diarrhea, Nausea Only and Other (See Comments)   Oseltamivir Phosphate Nausea And Vomiting   Tizanidine Hcl     Other reaction(s): dizzy, hallucinations   Codeine Nausea And Vomiting   Erythromycin Nausea And Vomiting   Hydrocodone-Acetaminophen Itching, Rash and Other (See Comments)    itching   Latex Itching and Other (See Comments)    I"if wear gloves hands get red ,itchy no prioblem if other wear and touch me"  Neomycin Rash and Other (See Comments)    Redness   Nickel Itching   Ranitidine Nausea Only   Tamiflu [Oseltamivir] Nausea And Vomiting   Tramadol Nausea Only    PHYSICAL EXAM:  Performance status (ECOG): 1 - Symptomatic but completely ambulatory  Vitals:   05/21/22 1447  Pulse: 81  Resp: 17  Temp: 98.1 F (36.7 C)  SpO2: 100%   Wt Readings from Last 3 Encounters:  05/21/22 109 lb 11.2 oz (49.8 kg)  04/23/22 114 lb 13.8 oz (52.1 kg)  02/20/22 114 lb 6.4 oz (51.9 kg)   Physical Exam Vitals reviewed.  Constitutional:      Appearance: Normal appearance.  Cardiovascular:     Rate and Rhythm: Normal rate and regular rhythm.     Pulses: Normal pulses.     Heart sounds: Normal heart sounds.  Pulmonary:     Effort: Pulmonary effort is normal.     Breath sounds: Normal breath sounds.  Abdominal:     Palpations: Abdomen is soft. There is no hepatomegaly, splenomegaly or mass.     Tenderness: There is no abdominal tenderness.  Neurological:     General: No focal deficit present.     Mental Status: She is alert and oriented to person, place, and time.  Psychiatric:        Mood and Affect: Mood normal.        Behavior: Behavior normal.     LABORATORY DATA:  I have reviewed the labs as listed.     Latest Ref Rng &  Units 05/09/2022   10:08 AM 04/23/2022   12:47 AM 04/22/2022    4:56 AM  CBC  WBC 4.0 - 10.5 K/uL 7.4  6.6  7.4   Hemoglobin 12.0 - 15.0 g/dL 11.5  9.9  10.5   Hematocrit 36.0 - 46.0 % 37.2  29.2  31.1   Platelets 150 - 400 K/uL 197  131  137       Latest Ref Rng & Units 05/09/2022   10:08 AM 04/24/2022    2:35 AM 04/23/2022   12:47 AM  CMP  Glucose 70 - 99 mg/dL 134  121  121   BUN 8 - 23 mg/dL 53  43  35   Creatinine 0.44 - 1.00 mg/dL 1.62  1.95  1.52   Sodium 135 - 145 mmol/L 138  137  137   Potassium 3.5 - 5.1 mmol/L 4.2  3.8  3.7   Chloride 98 - 111 mmol/L 104  106  108   CO2 22 - 32 mmol/L _0 Calcium 8.9 - 10.3 mg/dL 10.4  8.8  8.7   Total Protein 6.5 - 8.1 g/dL 9.3     Total Bilirubin 0.3 - 1.2 mg/dL 0.8     Alkaline Phos 38 - 126 U/L 276     AST 15 - 41 U/L 26     ALT 0 - 44 U/L 23         Component Value Date/Time   RBC 3.93 05/09/2022 1008   MCV 94.7 05/09/2022 1008   MCV 92 09/16/2019 1533   MCV 88.8 12/13/2016 1427   MCH 29.3 05/09/2022 1008   MCHC 30.9 05/09/2022 1008   RDW 14.1 05/09/2022 1008   RDW 13.2 09/16/2019 1533   RDW 14.6 (H) 12/13/2016 1427   LYMPHSABS 1.4 05/09/2022 1008   LYMPHSABS 1.2 12/13/2016 1427   MONOABS 0.5 05/09/2022 1008   MONOABS 0.8 12/13/2016 1427   EOSABS  0.2 05/09/2022 1008   EOSABS 0.2 12/13/2016 1427   BASOSABS 0.1 05/09/2022 1008   BASOSABS 0.1 12/13/2016 1427    DIAGNOSTIC IMAGING:  I have independently reviewed the scans and discussed with the patient. CT CHEST WO CONTRAST  Result Date: 04/27/2022 CLINICAL DATA:  Aortic stenosis.  History of abnormal chest x-ray. * Tracking Code: BO * EXAM: CT CHEST WITHOUT CONTRAST TECHNIQUE: Multidetector CT imaging of the chest was performed following the standard protocol without IV contrast. RADIATION DOSE REDUCTION: This exam was performed according to the departmental dose-optimization program which includes automated exposure control, adjustment of the mA and/or kV  according to patient size and/or use of iterative reconstruction technique. COMPARISON:  Dec 17, 2016 FINDINGS: Cardiovascular: Densely calcified ascending thoracic aorta. Signs of prior aortic valve replacement. Calcified coronary artery disease and signs of percutaneous coronary intervention and CABG are similar to prior imaging. Heart size is stable without pericardial effusion. Cardiac pacer device remains in place, leads in the RIGHT heart. Central pulmonary vasculature is of normal caliber. Limited assessment of cardiovascular structures given lack of intravenous contrast. Mediastinum/Nodes: Scattered small lymph nodes throughout the chest largest in the subcarinal region measuring approximately 1 cm without change since May of 2018. Lungs/Pleura: There is biapical pleural and parenchymal scarring. There is stable nodularity at the RIGHT lung apex that measures 12 x 10 mm, unchanged since May of 2018. A RIGHT-sided pleural effusion is mildly loculated, extending into the major fissure in the RIGHT chest and along the RIGHT posterior and medial chest as well as the lateral and sub pulmonic aspect of the RIGHT chest volume is slightly increased compared to imaging from 2018. Airways are patent. There is a new ground-glass nodule in the LEFT upper lobe measuring 2.2 x 1.7 cm. There are other areas of less well-defined ground-glass in the LEFT upper lobe, for instance along the LEFT heart border (image 41/3) this area measuring approximately 10 x 5 or 6 mm. A discrete 2 mm nodule is noted on image 45/3. There were subtle areas of nodularity in the lingula on the previous study and in the LEFT lower lobe which are no longer evident. Motion artifact limiting the remainder of the LEFT chest in the upper lobe and lingula. No LEFT-sided pleural effusion or pleural nodularity. Upper Abdomen: Sludge and stones in the gallbladder. Mild pericholecystic stranding is suggested. There is a small amount of ascites. Liver  contours are nodular. Pancreas to the extent evaluated is unremarkable as is the spleen. Adrenal glands are normal. Perinephric stranding on the LEFT, likely chronic, there is some generalized stranding throughout the upper abdomen and mild stranding also in the body wall potentially related to underlying mild edema. No adenopathy in the upper abdomen. No acute gastrointestinal process to the extent evaluated. Musculoskeletal: No acute musculoskeletal process. Spinal degenerative changes. IMPRESSION: 1. Slight enlargement of a now moderate RIGHT-sided pleural fluid with loculated features. Imaging compared to exam from 2018. 2. Focal area of ground-glass abutting the pleura in the LEFT upper lobe with shifting areas of nodular ground-glass changes and a discrete small pulmonary nodule as discussed. Potentially related to infectious or inflammatory etiology though given the well-defined nature of the LEFT upper lobe ground-glass nodule with suggest 3 to six-month follow-up to determine whether there is persistence of any of these findings with follow-up to be dictated by the most suspicious persistent finding if present. 3. Mild generalized edema in the upper abdomen. Small volume ascites and nodular hepatic contours raising the question of  liver disease, perhaps cardiogenic given patient history. 4. Gallbladder distension and cholelithiasis should be correlated with any RIGHT upper quadrant symptoms particularly in the setting of cardiac and liver disease. Gallbladder is incompletely imaged on today's study. 5. Small amount of ascites. 6. Signs of aortic valve replacement, extensive coronary artery disease and aortic atherosclerosis as on previous imaging. 7. Post LEFT mastectomy Aortic Atherosclerosis (ICD10-I70.0). These results will be called to the ordering clinician or representative by the Radiologist Assistant, and communication documented in the PACS or [employer]. Electronically Signed   By: Zetta Bills M.D.   On: 04/27/2022 14:59   DG Chest 2 View  Addendum Date: 04/26/2022   ADDENDUM REPORT: 04/26/2022 15:31 ADDENDUM: Imaging findings were discussed with patient's provider Dr. Mannie Stabile by telephone call on 04/26/2022. Patient apparently does not have any acute clinical symptoms. Still, there is dense homogeneous opacity in the anterior right lower lung field and subtle increased density in the posterior right lower lung field. Follow-up CT should be considered for further evaluation. In view of patient's renal dysfunction, Dr. Mannie Stabile would obtain noncontrast CT chest. Electronically Signed   By: Elmer Picker M.D.   On: 04/26/2022 15:31   Result Date: 04/26/2022 CLINICAL DATA:  Dyspnea EXAM: CHEST - 2 VIEW COMPARISON:  11/29/2017 FINDINGS: Cardiac size is within normal limits. There is previous placement of prosthetic aortic valve. Pacemaker battery is seen in left infraclavicular region with tips of leads in right atrium and right ventricle. There is interval appearance of moderate right pleural effusion part of which appears to be loculated along the lateral aspect of lower lung fields. These increased density in right lower lung field suggesting atelectasis/pneumonia. Rest of the lung fields are clear. Left lateral CP angle is clear. There is no pneumothorax. There is coarse calcification in the course of left coracoclavicular ligament. IMPRESSION: There is interval appearance of moderate sized right pleural effusion part of which appears to be loculated. Increased density in right lower lung field may suggest underlying atelectasis/pneumonia. Follow-up studies should be considered to rule out any underlying neoplastic process. Electronically Signed: By: Elmer Picker M.D. On: 04/25/2022 12:34   DG Abd 2 Views  Result Date: 04/22/2022 CLINICAL DATA:  Ileus. EXAM: ABDOMEN - 2 VIEW COMPARISON:  04/19/2022. FINDINGS: Normal bowel gas pattern. Specifically, no bowel dilation to suggest  obstruction or significant adynamic ileus. No free air. Densities in the right mid to upper abdomen are consistent with gallstones noted along CT dated 04/17/2022. There is opacity at the right lung base consistent with loculated fluid and atelectasis/infection, new compared to the previous CT. IMPRESSION: 1. No evidence of bowel obstruction or generalized adynamic ileus. 2. New opacity at the right lung base consistent with loculated fluid and atelectasis/pneumonia. Recommend follow-up chest radiograph. Electronically Signed   By: Lajean Manes M.D.   On: 04/22/2022 08:57     ASSESSMENT:  1.  Mild thrombocytopenia: -CT of the abdomen did not show any splenomegaly.  Previous CT disorder work-up was negative. -Peripheral blood smear showed giant platelets.  Clinically consistent with immune mediated thrombocytopenia.   2.  Mild hypercalcemia: -Her calcium level for the last 3 times in our clinic was normal to upper limit of normal. -Previous vitamin D was 75.  Intact PTH was normal.  Nonparathyroid hypercalcemia.  Calcitriol was 16.5.  PTH RP was less than 2. -SPEP and immunofixation was negative.  Free light chain ratio was elevated.   3.  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 -Last mammogram on 08/04/2019 reviewed by me was BI-RADS Category 4 with 0.6 cm mixed echogenicity mass in the right breast at 9:00. -Aspiration of this 0.6 cm complex cyst was successful on 08/24/2019.  Aspiration of the 0.4 cm cyst in the right breast 9:00 was successful.  Routine annual mammogram was recommended.   4.  Stage IIb Hodgkin's disease: -She received mantle radiation in 1981.  Continues to be in remission since then.  Does not have any B symptoms.   5.  Chromophobe renal cell carcinoma: -Nephrectomy in 2008.  Last CT in September 2019 showed stable 14 mm left renal lesion.   PLAN:  1.  Mild thrombocytopenia: - She has mild thrombocytopenia consistently since  2016. - CBC on 05/09/2022 shows platelet count 197.  No bleeding issues reported.   2.  Mild hypercalcemia: - She has history of mild hypercalcemia.  Calcium level today is 10.4 with albumin 4.1. - SPEP showed M spike of 0.2 g.  Kappa light chains at 123, lambda light chains 57 and ratio 2.17.  Immunofixation shows polyclonal gammopathy. - RTC 6 months with repeat myeloma labs.     Orders placed this encounter:  Orders Placed This Encounter  Procedures   CBC with Differential/Platelet   Comprehensive metabolic panel   Protein electrophoresis, serum   Kappa/lambda light chains   Immunofixation electrophoresis      Derek Jack, MD [place] [phone]

## 2022-05-21 NOTE — Patient Instructions (Signed)
[facility]  Discharge Instructions  You were seen and examined today by Dr. Delton Coombes.  Dr. Delton Coombes discussed your most recent lab work which revealed that your kidney functions are slightly better, your liver counts are stable, m-spike is slightly elevated but this can go up and down slightly. We will recheck your m-spike in 6 months.  Follow-up as scheduled in 6 months with labs.    Thank you for choosing [facility] to provide your oncology and hematology care.   To afford each patient quality time with our provider, please arrive at least 15 minutes before your scheduled appointment time. You may need to reschedule your appointment if you arrive late (10 or more minutes). Arriving late affects you and other patients whose appointments are after yours.  Also, if you miss three or more appointments without notifying the office, you may be dismissed from the clinic at the provider's discretion.    Again, thank you for choosing [facility].  Our hope is that these requests will decrease the amount of time that you wait before being seen by our physicians.   If you have a lab appointment with the [facility] please come in thru the Main Entrance and check in at the main information desk.           _____________________________________________________________  Should you have questions after your visit to [facility], please contact our office at [phone] and follow the prompts.  Our office hours are 8:00 a.m. to 4:30 p.m. Monday - Thursday and 8:00 a.m. to 2:30 p.m. Friday.  Please note that voicemails left after 4:00 p.m. may not be returned until the following business day.  We are closed weekends and all major holidays.  You do have access to a nurse 24-7, just call the main number to the clinic [phone] and do not press any options, hold on the line and a nurse will answer the phone.    For  prescription refill requests, have your pharmacy contact our office and allow 72 hours.    Masks are optional in the cancer centers. If you would like for your care team to wear a mask while they are taking care of you, please let them know. You may have one support person who is at least 69 years old accompany you for your appointments.

## 2022-05-23 DIAGNOSIS — E10649 Type 1 diabetes mellitus with hypoglycemia without coma: Secondary | ICD-10-CM | POA: Diagnosis not present

## 2022-05-23 DIAGNOSIS — K644 Residual hemorrhoidal skin tags: Secondary | ICD-10-CM | POA: Diagnosis not present

## 2022-05-23 DIAGNOSIS — T148XXA Other injury of unspecified body region, initial encounter: Secondary | ICD-10-CM | POA: Diagnosis not present

## 2022-05-23 DIAGNOSIS — L03116 Cellulitis of left lower limb: Secondary | ICD-10-CM | POA: Diagnosis not present

## 2022-05-24 ENCOUNTER — Inpatient Hospital Stay (HOSPITAL_COMMUNITY): Admission: RE | Admit: 2022-05-24 | Payer: Medicare PPO | Source: Ambulatory Visit | Admitting: Cardiology

## 2022-05-28 ENCOUNTER — Ambulatory Visit (HOSPITAL_BASED_OUTPATIENT_CLINIC_OR_DEPARTMENT_OTHER): Payer: Medicare PPO | Attending: Cardiology | Admitting: Cardiology

## 2022-05-28 VITALS — Ht 62.0 in | Wt 106.0 lb

## 2022-05-28 DIAGNOSIS — I110 Hypertensive heart disease with heart failure: Secondary | ICD-10-CM | POA: Diagnosis not present

## 2022-05-28 DIAGNOSIS — G4733 Obstructive sleep apnea (adult) (pediatric): Secondary | ICD-10-CM

## 2022-05-28 DIAGNOSIS — I5032 Chronic diastolic (congestive) heart failure: Secondary | ICD-10-CM | POA: Diagnosis not present

## 2022-05-29 DIAGNOSIS — Z23 Encounter for immunization: Secondary | ICD-10-CM | POA: Diagnosis not present

## 2022-05-29 DIAGNOSIS — L853 Xerosis cutis: Secondary | ICD-10-CM | POA: Diagnosis not present

## 2022-05-29 DIAGNOSIS — L03116 Cellulitis of left lower limb: Secondary | ICD-10-CM | POA: Diagnosis not present

## 2022-05-30 ENCOUNTER — Other Ambulatory Visit (HOSPITAL_BASED_OUTPATIENT_CLINIC_OR_DEPARTMENT_OTHER): Payer: Self-pay | Admitting: Cardiology

## 2022-05-31 NOTE — Procedures (Addendum)
   Patient Name: [name] [name], [place] Date: 05/28/2022 Gender: Female D.O.B: [birth year] Age (years): 24 Referring Provider: Fransico Him MD, ABSM Height (inches): 62 Interpreting Physician: Fransico Him MD, ABSM Weight (lbs): 106 RPSGT: Zadie Rhine BMI: 19 MRN: [id number] Neck Size: 12.00  CLINICAL INFORMATION The patient is referred for a BiPAP titration to treat sleep apnea.  SLEEP STUDY TECHNIQUE As per the AASM Manual for the Scoring of Sleep and Associated Events v2.3 (April 2016) with a hypopnea requiring 4% desaturations.  The channels recorded and monitored were frontal, central and occipital EEG, electrooculogram (EOG), submentalis EMG (chin), nasal and oral airflow, thoracic and abdominal wall motion, anterior tibialis EMG, snore microphone, electrocardiogram, and pulse oximetry. Bilevel positive airway pressure (BPAP) was initiated at the beginning of the study and titrated to treat sleep-disordered breathing.  MEDICATIONS Medications self-administered by patient taken the night of the study : XANAX, MAGNESIUM OXIDE, febuxostat, apidra,  brilinta, VIBRA-TABS, LEXAPRO, PEPCID, IMDUR, XALATAN, TOPROL XL, PRAVACHOL, REQUIP, vascepa  RESPIRATORY PARAMETERS Optimal IPAP Pressure (cm): 9  AHI at Optimal Pressure (/hr) 0 Optimal EPAP Pressure (cm):5  Overall Minimal O2 (%):91.0  Minimal O2 at Optimal Pressure (%): 95.0  SLEEP ARCHITECTURE Start Time:9:44:32 PM  Stop Time:4:25:54 AM  Total Time (min):401.4  Total Sleep Time (min):317 Sleep Latency (min):32.5  Sleep Efficiency (%):79.0%  REM Latency (min):304.5  WASO (min):51.9 Stage N1 (%): 3.9%  Stage N2 (%): 75.2%  Stage N3 (%):20.3%  Stage R (%):0.5 Supine (%):59.21  Arousal Index (/hr):34.1   CARDIAC DATA The 2 lead EKG demonstrated sinus rhythm, pacemaker generated. The mean heart rate was 69.7 beats per minute. Other EKG findings include: None.  LEG MOVEMENT DATA The total Periodic Limb Movements of  Sleep (PLMS) were 0. The PLMS index was 0.0. A PLMS index of <15 is considered normal in adults.  IMPRESSIONS - An optimal PAP pressure was selected for this patient ( 9/5 cm of water) - Significant oxygen desaturations were not observed during this titration (min O2 = 91.0%). - No snoring was audible during this study. - No cardiac abnormalities were observed during this study. - Clinically significant periodic limb movements were not noted during this study. Arousals associated with PLMs were significant.  DIAGNOSIS - Obstructive Sleep Apnea (G47.33)  RECOMMENDATIONS - Trial of BiPAP therapy on 9/5 cm H2O with a Small-Medium size [employer] Full Face Evora Full mask and heated humidification. - Avoid alcohol, sedatives and other CNS depressants that may worsen sleep apnea and disrupt normal sleep architecture. - Sleep hygiene should be reviewed to assess factors that may improve sleep quality. - Weight management and regular exercise should be initiated or continued. - Return to Sleep Center for re-evaluation after 6 weeks of therapy  [Electronically signed] 05/31/2022 10:21 PM  Fransico Him MD, [place], American Board of Sleep Medicine NPI: [id number]

## 2022-06-01 ENCOUNTER — Telehealth: Payer: Self-pay | Admitting: Cardiology

## 2022-06-01 ENCOUNTER — Encounter (HOSPITAL_BASED_OUTPATIENT_CLINIC_OR_DEPARTMENT_OTHER): Payer: Self-pay | Admitting: Cardiology

## 2022-06-01 NOTE — Telephone Encounter (Signed)
Patient calling in to get a appt, she states that she been having some trouble while sleeping. She lives out of town and wouldn't be back in town till [place]. Please advise

## 2022-06-11 ENCOUNTER — Telehealth: Payer: Self-pay | Admitting: *Deleted

## 2022-06-11 DIAGNOSIS — G4733 Obstructive sleep apnea (adult) (pediatric): Secondary | ICD-10-CM

## 2022-06-11 NOTE — Telephone Encounter (Signed)
-----   Message from Lauralee Evener, [place] sent at 06/01/2022  8:56 AM EST -----  ----- Message ----- From: Sueanne Margarita, MD Sent: 05/31/2022  10:23 PM EST To: Cv Div Sleep Studies  Please let patient know that they had a successful PAP titration and let DME know that orders are in EPIC.  Please set up 6 week OV with me.

## 2022-06-11 NOTE — Telephone Encounter (Addendum)
The patient has been notified of the result and verbalized understanding.  All questions (if any) were answered. [name] [name], [place] 06/11/2022 5:57 PM    Upon patient request DME selection is [facility]. Patient understands he will be contacted by [facility] to change her pressure. Patient understands to call if [facility] does not contact him with new setup in a timely manner. Patient understands they will be called once confirmation has been received from [facility]/ that they have received their new pressure change to schedule a 6 week follow up appointment.   [facility] notified of new Bipap order pressure change.  Patient was grateful for the call and thanked me.

## 2022-06-13 ENCOUNTER — Encounter (HOSPITAL_COMMUNITY): Payer: Self-pay

## 2022-06-13 ENCOUNTER — Ambulatory Visit (HOSPITAL_COMMUNITY)
Admission: RE | Admit: 2022-06-13 | Discharge: 2022-06-13 | Disposition: A | Discharge status: Home / Self Care | Payer: Medicare PPO | Source: Ambulatory Visit | Attending: Cardiology | Admitting: Cardiology

## 2022-06-13 VITALS — BP 134/60 | HR 73 | Wt 115.0 lb

## 2022-06-13 DIAGNOSIS — Z905 Acquired absence of kidney: Secondary | ICD-10-CM | POA: Insufficient documentation

## 2022-06-13 DIAGNOSIS — E1022 Type 1 diabetes mellitus with diabetic chronic kidney disease: Secondary | ICD-10-CM | POA: Diagnosis not present

## 2022-06-13 DIAGNOSIS — Z951 Presence of aortocoronary bypass graft: Secondary | ICD-10-CM | POA: Insufficient documentation

## 2022-06-13 DIAGNOSIS — Z7902 Long term (current) use of antithrombotics/antiplatelets: Secondary | ICD-10-CM | POA: Diagnosis not present

## 2022-06-13 DIAGNOSIS — G4733 Obstructive sleep apnea (adult) (pediatric): Secondary | ICD-10-CM | POA: Diagnosis not present

## 2022-06-13 DIAGNOSIS — Z8571 Personal history of Hodgkin lymphoma: Secondary | ICD-10-CM | POA: Insufficient documentation

## 2022-06-13 DIAGNOSIS — Z953 Presence of xenogenic heart valve: Secondary | ICD-10-CM | POA: Diagnosis not present

## 2022-06-13 DIAGNOSIS — Z950 Presence of cardiac pacemaker: Secondary | ICD-10-CM | POA: Diagnosis not present

## 2022-06-13 DIAGNOSIS — I480 Paroxysmal atrial fibrillation: Secondary | ICD-10-CM | POA: Diagnosis not present

## 2022-06-13 DIAGNOSIS — N184 Chronic kidney disease, stage 4 (severe): Secondary | ICD-10-CM | POA: Diagnosis not present

## 2022-06-13 DIAGNOSIS — Z9641 Presence of insulin pump (external) (internal): Secondary | ICD-10-CM | POA: Insufficient documentation

## 2022-06-13 DIAGNOSIS — I130 Hypertensive heart and chronic kidney disease with heart failure and stage 1 through stage 4 chronic kidney disease, or unspecified chronic kidney disease: Secondary | ICD-10-CM | POA: Insufficient documentation

## 2022-06-13 DIAGNOSIS — Z794 Long term (current) use of insulin: Secondary | ICD-10-CM | POA: Insufficient documentation

## 2022-06-13 DIAGNOSIS — I495 Sick sinus syndrome: Secondary | ICD-10-CM | POA: Diagnosis not present

## 2022-06-13 DIAGNOSIS — D890 Polyclonal hypergammaglobulinemia: Secondary | ICD-10-CM | POA: Insufficient documentation

## 2022-06-13 DIAGNOSIS — I25110 Atherosclerotic heart disease of native coronary artery with unstable angina pectoris: Secondary | ICD-10-CM | POA: Diagnosis not present

## 2022-06-13 DIAGNOSIS — Q231 Congenital insufficiency of aortic valve: Secondary | ICD-10-CM | POA: Diagnosis not present

## 2022-06-13 DIAGNOSIS — I5032 Chronic diastolic (congestive) heart failure: Secondary | ICD-10-CM | POA: Diagnosis not present

## 2022-06-13 DIAGNOSIS — Z955 Presence of coronary angioplasty implant and graft: Secondary | ICD-10-CM | POA: Diagnosis not present

## 2022-06-13 DIAGNOSIS — Z79899 Other long term (current) drug therapy: Secondary | ICD-10-CM | POA: Insufficient documentation

## 2022-06-13 DIAGNOSIS — D696 Thrombocytopenia, unspecified: Secondary | ICD-10-CM | POA: Diagnosis not present

## 2022-06-13 DIAGNOSIS — D649 Anemia, unspecified: Secondary | ICD-10-CM | POA: Diagnosis not present

## 2022-06-13 DIAGNOSIS — I425 Other restrictive cardiomyopathy: Secondary | ICD-10-CM | POA: Insufficient documentation

## 2022-06-13 DIAGNOSIS — I050 Rheumatic mitral stenosis: Secondary | ICD-10-CM | POA: Diagnosis not present

## 2022-06-13 DIAGNOSIS — E785 Hyperlipidemia, unspecified: Secondary | ICD-10-CM | POA: Diagnosis not present

## 2022-06-13 LAB — BASIC METABOLIC PANEL
Anion gap: 10 (ref 5–15)
BUN: 68 mg/dL — ABNORMAL HIGH (ref 8–23)
CO2: 26 mmol/L (ref 22–32)
Calcium: 9.8 mg/dL (ref 8.9–10.3)
Chloride: 102 mmol/L (ref 98–111)
Creatinine, Ser: 2.53 mg/dL — ABNORMAL HIGH (ref 0.44–1.00)
GFR, Estimated: 20 mL/min — ABNORMAL LOW (ref >60)
Glucose, Bld: 175 mg/dL — ABNORMAL HIGH (ref 70–99)
Potassium: 4.5 mmol/L (ref 3.5–5.1)
Sodium: 138 mmol/L (ref 135–145)

## 2022-06-13 LAB — BRAIN NATRIURETIC PEPTIDE: B Natriuretic Peptide: 172.2 pg/mL — ABNORMAL HIGH (ref 0.0–100.0)

## 2022-06-13 MED ORDER — TORSEMIDE 20 MG PO TABS: ORAL_TABLET | ORAL | 3 refills | Status: DC

## 2022-06-13 NOTE — Patient Instructions (Signed)
It was great to see you today! No medication changes are needed at this time.   Labs today We will only contact you if something comes back abnormal or we need to make some changes. Otherwise no news is good news!   Your physician wants you to follow-up in: 3 months with Dr Kendall Flack will receive a reminder letter in the mail two months in advance. If you don't receive a letter, please call our office to schedule the follow-up appointment.   Do the following things EVERYDAY: Weigh yourself in the morning before breakfast. Write it down and keep it in a log. Take your medicines as prescribed Eat low salt foods--Limit salt (sodium) to 2000 mg per day.  Stay as active as you can everyday Limit all fluids for the day to less than 2 liters'   At the [facility], you and your health needs are our priority. As part of our continuing mission to provide you with exceptional heart care, we have created designated Provider Care Teams. These Care Teams include your primary Cardiologist (physician) and Advanced Practice Providers (APPs- Physician Assistants and Nurse Practitioners) who all work together to provide you with the care you need, when you need it.   You may see any of the following providers on your designated Care Team at your next follow up: Dr Glori Bickers Dr [name] Dr. Roxana Hires, NP Lyda Jester, [place] [facility] [place], [place] Forestine Na, NP Audry Riles, PharmD   Please be sure to bring in all your medications bottles to every appointment.

## 2022-06-13 NOTE — Progress Notes (Addendum)
Date:  06/13/2022   ID:  [name] [name], DOB June 30, 1951, MRN [id number]   Provider location: [place] Type of Visit: Established patient  PCP:  [name], MD  Cardiologist:  None Primary HF: Dr. Aundra Dubin   History of Present Illness: [name] [name] is a 71 y.o. female who has a history of CAD s/p CABG in 12/05 and redo in 1/11, bioprosthetic AVR, diastolic CHF, and sick sinus syndrome s/p PPM presents for cardiology followup.  She had initial CABG with SVG-RCA in 12/05 followed by redo SVG-RCA and bioprosthetic AVR in 1/11.  She has had diastolic CHF and is on Lasix.  In 9/14, she had a CPX test.  This showed severe functional limitation with ischemic ECG changes (inferolateral ST depression and ST elevation in V1 and V2). She had chest pain.  She had R/LHC in 9/14.  This showed elevated left and right heart filling pressures, patent SVG-PDA, and 80% distal LM/80% ostial LAD stenosis.  She was not a candidate for redo CABG (had 2 prior sternotomies as well as chest radiation for Hodgkins lymphoma).  She had DES from left main into proximal LAD and will need long-term DAPT => Brilinta was used as she has been a poor responder to Plavix in the past.  She re-developed angina in 11/15 and was admitted with unstable angina.  LHC showed 80% ostial LM and patent SVG-RCA. She had DES to ostial LM. Echo in 1/18 showed EF 60-65%, normal bioprosthetic aortic valve, mild-moderate MR, PASP 35 mmHg.    She has chronic iron deficiency anemia thought to be due to a slow GI bleed.  Last EGD was unremarkable and a capsule endoscopy was also unremarkable.  She has had periodic transfusions.     She had bilateral mastectomy for DCIS in 55/37 without complication.   She has an insulin pump followed by an endocrinologist at [facility].    Given myalgias with statins, she was started on Praluent.  LDL is now excellent.     In the past she has had short runs of atrial fibrillation.   She is not anticoagulated. Planned for Watchman placement, but given her nickel allergy, we were unable to place a Watchman.      Diagnosed with OSA, now on CPAP.    Given ongoing problems with exertional dyspnea and occasional chest pain, Cardiolite was repeated in 9/18 and showed inferior changes.  She underwent left/right heart cath in 9/18.  This showed elevated filling pressures, R>L.  There was 75+% ostial LCx stenosis.  However, there were 2 layers of stent from the mid left main into the ostial LAD, and LCx intervention was thought to be very risky.  It was decided to manage her medically.  Echo was repeated in 10/18 showing EF 55-60%, moderate to severe TR.     Given recurrent chest pain, she had ETT-Cardiolite done in 3/19.  This was a normal study with no evidence for ischemia or infarction.  Echo in 1/20 showed EF 55-60%, mild LVH, stable bioprosthetic aortic valve, moderate-severe TR.    Due to increased dyspnea and worsening renal function, she was set up for echo and RHC.  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  Freedom in 8/20 showed primarily right-sided HF.  Cardiolite in 8/20  showed no ischemia or infarction.  Torsemide was increased and she cut back on sodium in her diet.    CPX in 11/20 showed severe functional limitation from HF.   She saw Dr. Mosetta Pigeon at [facility] for evaluation for heart/kidney transplant (for restrictive cardiomyopathy).  She was determined to be a poor surgical candidate due to extensive vascular calcifications.   CT chest in 8/21 showed a loculated right pleural effusion but unable to find enough fluid by ultrasound for thoracentesis.   Echo in 9/21 showed EF 60-65% with mild LVH, mild RV dilation with mildly decreased systolic  function and a D-shaped septum, PASP 40, normal bioprosthetic aortic valve, rheumatic-appearing MV with mild-moderate mitral stenosis (mean gradient 9 but MVA by PHT 2.7 cm^2), trivial MR.    Echo 2/23, EF 60-65%, mild LVH, normal RV systolic function but with D-shaped septum, normal bioprosthetic aortic valve, mild mitral stenosis (rheumatic-appearing valve, mean gradient 4 mmHg), mild MR.   Recently admitted to hospital 10/23 w/ nausea x 2wks, ab pain/bloating and constipation, no BM x1week. Reported symptoms started after stating [place]. She subsequently stopped taking [place]. W/u showed no obvious evidence of obstruction and she was started on aggressive bowel regimen.  Slowly started having bowel movements and felt much better.  Discharged home. Dr. Aundra Dubin notified. He did not suspect GI issues were caused by [place] and gave patient option to restart vs switch back to spironolactone. She is now back on spiro.   Also recently seen by hematology. Recent labs showed mild hypercalcemia. Calcium level 10.4 with albumin 4.1. SPEP showed M spike of 0.2 g.  Kappa light chains at 123, lambda light chains 57 and ratio 2.17.  Immunofixation showed polyclonal gammopathy. Plan is to return in 6 months to repeat myeloma labs. She has not had a cMRI due to PPM.   She presents today for f/u. Reports exertional SOB/fatigue and positional dizziness, no syncope/ near syncope. Denies CP. No LEE. No orthopnea/PND. Denies palpitations. Can tell when she is in afib, no recent symptoms. ReDs 29%.   BP 134/60. EKG shows NSR w/ 1st deg AVB 72 bpm, LAE. No ischemic abnormalities.     ECG (personally reviewed): NSR, 1st deg AVB, 72 bpm, LAE    Labs (2/19): hgb 13.2 Labs @ [facility] (3/19): K 4.7, creatinine 1.4, ANA negative, RF negative Labs (4/19): K 3.9, creatinine 1.52 => 1.63 Labs (6/19): LDL 8, HDL 42, TGs 259 Labs (9/19): K 4.9, creatinine 1.65, hgb 13.4, plts 129, LDL 46,TGs 182 Labs (12/19): K 4.9,  creatinine 2.07, hgb 12.4 Labs (3/20): K 4, creatinine 1.8 Labs (6/20): K 4.7, creatinine 1.47 Labs (8/20): creatinine 2.13 => 2.46 => 2.4, LDL 22 Labs (9/20): K 4.8, creatinine 2.74, AST/ALT normal, hgb 12.6 Labs (1/21): K 4.1, creatinine 1.86, hgb 11.7 Labs (4/21): K 4.7, creatinine 1.7 Labs (6/21): K 4.2, creatinine 2 => 1.7 Labs (8/21): K 3.8, creatinine 1.69, hgb 10.6, plts 116 Labs (9/21): LDL 34, TGs 209 Labs (1/22): K 3.8, creatinine 2.0 Labs (3/22): LDL 25 Labs (4/22): K 4.7, creatinine 2.0, hgb 12.3 Labs (5/22): K 4.4, creatinine 2.21 Labs (8/22): LDL 1, TGs 330 Labs (10/22): K 4.6, creatinine 1.87 Labs (11/22): LDL 26 Labs (12/22): K 4.1, creatinine 2.25 Labs (1/23): creatinine 2.65 Labs (3/23): K 4.6, creatinine 2.8 Labs (4/23): K 4.1, creatinine 2.16, BNP 94 Labs (10/23): K 4.2, creatinine 1.62, calcium 10.4    PMH: 1. Sick sinus syndrome s/p [employer] PCM.  2. HTN 3. H/o SBO 4. Renal cell carcinoma  s/p right nephrectomy in 2008.  5. 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  Fibroscan in 11/14 showed advanced fibrosis concerning for cirrhosis. EGD in 11/14 showed no varices.  6. Diastolic CHF: Echo (5/27) with EF 60-65%, bioprosthetic aortic valve with mean gradient 15 mmHg, mild MR.  CPX (9/14): peak VO2 10.4, VE/VCO2 43.8, inferolateral ST depression and ST elevation in V1/V2 with severe dyspnea and chest pain, severe functional limitation with ischemic changes.  Echo (9/14) with EF 60-65%, normal RV size and systolic function, mild-moderate MR, bioprosthetic aortic valve normal, PA systolic pressure 51 mmHg, no evidence for pericardial constriction though exam incomplete for constriction.  Echo (11/15) with EF 55-60%, bioprosthetic aortic valve, mild-moderate MR, moderate TR, PA systolic pressure 42 mmHg.  - TEE (5/17) with EF 60-65%, normal bioprosthetic aortic valve with mean gradient 13 mmHg, normal RV size and systolic function. - Echo (1/18) with EF 60-65%, normal  bioprosthetic aortic valve, mild-moderate MR, PASP 35 mmHg.  - Echo (10/18) with EF 55-60%, bioprosthetic aortic valve mean gradient 10 mmHg, MAC with mild mitral stenosis (mean gradient 7) and mild mitral regurgitation, moderate to severe TR, PASP 43 mmHg.  - RHC (9/18): mean RA 13, PA 44/18 mean 33, mean PCWP 19, CI 3.45, PVR 2.7 WU.  - Echo (1/20): EF 55-60%, mild LVH, mild MR, stable bioprosthetic aortic valve, moderate-severe TR.  - RHC (8/20): mean RA 14, PA 43/15 mean 28, mean PCWP 13, PAPI 2, PVR 4.2, CI 2.31 - Echo (8/20): EF 55-60%, RV mildly dilated with normal systolic function, mildly D-shaped septum suggestive of RV pressure/volume overload, rheumatic-appearing mitral valve with mild mitral stenosis (mean gradient 6, MVA by PHT 4 cm^2), no evidence for pericardial constriction, moderate TR, bioprosthetic aortic valve functioning normally with mean gradient 6 mmHg.  - CPX (11/20): peak VO2 9.8, VE/VCO2 slope 38, RER 1.11 => severe functional limitation from HF.   - Echo (9/21): EF 60-65% with mild LVH, mild RV dilation with mildly decreased systolic function and a D-shaped septum, PASP 40, normal bioprosthetic aortic valve, rheumatic-appearing MV with mild-moderate mitral stenosis (mean gradient 9 but MVA by PHT 2.7 cm^2, trivial MR.  - Echo (2/23): EF 60-65%, mild LVH, normal RV systolic function but with D-shaped septum, normal bioprosthetic aortic valve, mild mitral stenosis (rheumatic-appearing valve, mean gradient 4 mmHg), mild MR.  7. TAH-BSO 8. Type I diabetes: Has insulin pump.  9. H/o ischemic colitis. 10. H/o PUD. 11. Diabetic gastroparesis. 12. Hyperlipidemia: Myalgias with Crestor and atorvastatin, elevated LFTs with simvastatin.  Myalgias with > 10 mg pravastatin. Now on Praluent.  13. CKD stage IV: Sees Dr. Lorrene Reid 14. Bicuspid aortic valve s/p 19 mm Edwards pericardial valve in 1/11 (had post-operative Dresslers syndrome).   15. CAD: CABG with SVG-RCA in 12/05.  Redo  SVG-RCA in 1/11 with AVR.  LHC/RHC (9/14) with mean RA 18, PA 62/25 mean 43, mean PCWP 26, CI 2.8, 70-80% distal LM stenosis, 80% ostial LAD stenosis, total occlusion RCA, SVG-PDA patent.  Patient had DES LM into LAD.  Plan to continue long-term ASA/Brilinta (poor responder to Plavix).  ETT-cardiolite (4/15) with 6' exercise, EF 60%, ST depression in recovery and mild chest pain, no ischemia or infarction by perfusion images.  Unstable angina 11/15 with 80% LM, patent SVG-RCA => DES to ostial LM.   - Lexiscan Cardiolite (2/18): EF 62%, no ischemia or infarction (normal).  She had a significant reaction to [place] and probably should not get again.  - Cardiolite (9/18): EF 72%, prior inferior  infarct with mild peri-infarct ischemia.  - LHC (9/18): 2 layers stent left main - ostial LAD with 50% dLM in-stent restenosis, 75+% ostial LCx, totally occluded RCA, SVG-RCA patent.  Medical management planned.  - ETT-Cardiolite (4/19): EF normal, 7'11" exercise, no ischemia or infarction.  - Lexiscan Cardiolite (8/20): No ischemia/infarction.  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  17. Anemia: Iron-deficiency.  Suspect chronic GI blood loss.  EGD in 9/14 was unremarkable.  Capsule endoscopy 10/14 was unremarkable.  Has required periodic blood transfusions.  18. Atrial fibrillation: Paroxysmal, noted by [facility] interrogation.  - Zio monitor 3/23: < 1% atrial fibrillation, longest run was 18 minutes.  19. Carotid stenosis: Carotid dopplers (2/15) with 40-59% bilateral stenosis. Carotid dopplers (3/16) with 40-59% bilateral ICA stenosis. Carotid dopplers (3/17) with 40-59% bilateral ICA stenosis.  - Carotid dopplers (3/18) with mild BICA stenosis.  20. 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  22. C difficile colitis 2017 23. Gout 24. ?TIA: head CT  negative.  25. OSA: Moderate, uses CPAP.  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 Hypercalcemia 28. 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  Mild-moderate on 9/21 echo.  Mild on 2/23 echo.  29. Tricuspid regurgitation: Moderate by 8/20 echo.  30. Lung nodules: Followed at [facility] => stable.  31. ?Parkinsonism: She did not tolerate Sinemet.   Current Outpatient Medications  Medication Sig Dispense Refill   acyclovir ointment (ZOVIRAX) 5 % Apply 1 application topically every 3 (three) hours as needed (fever blister/cold sores).     ALPRAZolam (XANAX) 0.5 MG tablet Take 1 tablet (0.5 mg total) by mouth 2 (two) times daily as needed for anxiety. 60 tablet 1   APIDRA 100 UNIT/ML injection Inject 40 Units into the skin as directed. INFUSE THROUGH INSULIN PUMP UTD, Bolus depends on carb intake  2   aspirin EC 81 MG tablet Take 81 mg by mouth at bedtime.     bisacodyl (DULCOLAX) 5 MG EC tablet Take 1 tablet (5 mg total) by mouth daily as needed for moderate constipation. 30 tablet 0   BRILINTA 60 MG TABS tablet TAKE ONE TABLET (60MG TOTAL) BY MOUTH TWICE DAILY (Patient taking differently: Take 60 mg by mouth 2 (two) times daily.) 180 tablet 3   cetirizine (ZYRTEC) 10 MG tablet Take 10 mg by mouth daily.     clobetasol (OLUX) 0.05 % topical foam Apply 1 application topically 2 (two) times daily as needed (skin flaking).     Coenzyme Q10 (CO Q 10) 100 MG CAPS Take 100 mg by mouth at bedtime.      colchicine 0.6 MG tablet Take 0.6 mg by mouth daily as needed (gout flare).     denosumab (PROLIA) 60 MG/ML SOSY injection Inject 60 mg into the skin every 6 (six) months.     diclofenac Sodium (VOLTAREN) 1 % GEL Apply 2 g topically 4 (four) times daily. As needed     escitalopram (LEXAPRO) 5 MG tablet Take 5 mg by mouth daily.     famotidine (PEPCID) 40 MG tablet Take 40 mg by mouth daily. Takes a second tablet at night if needed.     glucagon (GLUCAGON EMERGENCY) 1 MG injection Inject 1 mg  into the vein once as needed (low blood sugar).     isosorbide mononitrate (IMDUR) 120 MG 24 hr tablet TAKE ONE TABLET (120MG TOTAL) BY MOUTH DAILY 90  tablet 3   latanoprost (XALATAN) 0.005 % ophthalmic solution Place 1 drop into both eyes at bedtime.     metolazone (ZAROXOLYN) 2.5 MG tablet Take 2.5 mg (1 tab) once as directed by CHF clinic. Call before taking [phone] 3 tablet 0   metoprolol succinate (TOPROL XL) 25 MG 24 hr tablet Take 1 tablet (25 mg total) by mouth daily. 90 tablet 3   mometasone (NASONEX) 50 MCG/ACT nasal spray Place 2 sprays into the nose daily as needed (allergy).     mupirocin cream (BACTROBAN) 2 % Apply 1 application topically 2 (two) times daily as needed. 90 g 0   nitroGLYCERIN (NITROLINGUAL) 0.4 MG/SPRAY spray PLACE 1 SPRAY UNDER THE TONGUE EVERY 5 MINUTES FOR 3 DOSES AS NEEDED FOR CHEST PAIN. 14.7 g 0   polyethylene glycol powder (GLYCOLAX/MIRALAX) 17 GM/SCOOP powder Take 17 g by mouth 2 (two) times daily. (Patient taking differently: Take 17 g by mouth 2 (two) times daily. As needed) 238 g 1   pravastatin (PRAVACHOL) 10 MG tablet TAKE ONE TABLET (10 MG TOTAL) BY MOUTH DAILY. 90 tablet 3   REPATHA SURECLICK 366 MG/ML SOAJ INJECT 140MG INTO THE SKIN EVERY 14 DAYS (Patient taking differently: Inject 140 mg into the skin every 14 (fourteen) days.) 6 mL 3   rOPINIRole (REQUIP) 0.25 MG tablet TAKE ONE TABLET (0.25MG TOTAL) BY MOUTH DAILY. TAKE TWO HOURS PRIOR TO BEDTIME EVERY NIGHT. 30 tablet 0   senna-docusate (SENOKOT-S) 8.6-50 MG tablet Take 1 tablet by mouth 2 (two) times daily. (Patient taking differently: Take 1 tablet by mouth 2 (two) times daily. As needed) 60 tablet 0   silver nitrate applicators 29-47 % applicator Use as needed to stop superficial skin bleeding. (Patient taking differently: Apply 1 Stick topically as needed for bleeding. Use as needed to stop superficial skin bleeding.) 10 each 0   spironolactone (ALDACTONE) 25 MG tablet Take 12.5 mg by mouth  daily.     torsemide (DEMADEX) 20 MG tablet Take 2 tablets by mouth daily alternating with 62m daily. (Patient taking differently: Take 20-40 mg by mouth See admin instructions. Take 2 tablets by mouth daily alternating with 20 mg) 30 tablet 6   ULORIC 40 MG tablet Take 1 tablet (40 mg total) by mouth at bedtime. 90 tablet 3   VASCEPA 1 g capsule TAKE TWO CAPSULES (2 GRAMS TOTAL) BY MOUTH TWO TIMES DAILY 360 capsule 3   Wheat Dextrin (BENEFIBER) POWD Take 1 Dose by mouth daily as needed.     No current facility-administered medications for this encounter.    Allergies:   Avandia [rosiglitazone maleate], Cephalexin, Clindamycin, Clindamycin hcl, Humalog [insulin lispro], Lincomycin, Metformin, Novolog [insulin aspart], Penicillins, Rosiglitazone, Tizanidine, Adhesive [tape], Atorvastatin, Cholestyramine, Gentamycin [gentamicin], Iodinated contrast media, Limonene, Prednisone, Ranexa [ranolazine], Rosuvastatin, Simvastatin, Azelastine, Clopidogrel, Lisinopril, Oseltamivir phosphate, Tizanidine hcl, Codeine, Erythromycin, Hydrocodone-acetaminophen, Latex, Neomycin, Nickel, Ranitidine, Tamiflu [oseltamivir], and Tramadol   Social History:  The patient  reports that she has never smoked. She has never used smokeless tobacco. She reports that she does not drink alcohol and does not use drugs.   Family History:  The patient's family history includes Anal fissures in her mother; Breast cancer (age of onset: 411 in her sister; Cancer (age of onset: 3108 in her sister; Cancer (age of onset: 535 in her maternal grandfather; Diabetes in her father; Glaucoma in her brother; Heart attack in her father, maternal grandmother, and mother; Heart disease in her father and mother; Hypertension in her brother; Lung cancer in  her sister; Parkinson's disease in her paternal aunt.   ROS:  Please see the history of present illness.   All other systems are personally reviewed and negative.   Exam:   BP 134/60   Pulse 73    Wt 52.2 kg (115 lb)   SpO2 99%   BMI 21.03 kg/m  ReDs 29%  General:  Well appearing, thin WF. No respiratory difficulty HEENT: normal Neck: supple. no JVD. Carotids 2+ bilat; no bruits. No lymphadenopathy or thyromegaly appreciated. Cor: PMI nondisplaced. Regular rate & rhythm. No rubs, gallops or murmurs. Lungs: clear Abdomen: soft, nontender, nondistended. No hepatosplenomegaly. No bruits or masses. Good bowel sounds. Extremities: no cyanosis, clubbing, rash, edema Neuro: alert & oriented x 3, cranial nerves grossly intact. moves all 4 extremities w/o difficulty. Affect pleasant.   Recent Labs: 04/18/2022: B Natriuretic Peptide 112.8 04/20/2022: Magnesium 2.4 05/09/2022: ALT 23; BUN 53; Creatinine, Ser 1.62; Hemoglobin 11.5; Platelets 197; Potassium 4.2; Sodium 138  Personally reviewed   Wt Readings from Last 3 Encounters:  06/13/22 52.2 kg (115 lb)  05/28/22 48.1 kg (106 lb)  05/21/22 49.8 kg (109 lb 11.2 oz)    ASSESSMENT AND PLAN:  1. CAD: s/p SVG-RCA in 12/05, then redo SVG-RCA in 1/11--> distal left main/proximal LAD severe stenosis in 9/14.  PCI with DES to distal LM/proximal LAD was done rather than bypass as she would have been a poor candidate for redo sternotomy (2 prior sternotomies and history of chest wall radiation for Hodgkins disease).  Recurrent unstable angina with 80% ostial LM treated with repeat DES in 11/15.  Lexiscan Cardiolite in 9/18 inferior changes.  Repeat LHC (9/18) showed 50% distal left main in-stent restenosis and 75+% ostial LCx stenosis. I reviewed the films at the time with interventional cardiology => would be very difficult to intervene on the ostial LCx through 2 overlapping stents from left main into the LAD.  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 Given no ACS, we planned medical management and increased Imdur.  Cardiolite in 3/19 and again in 8/20 showed no ischemia or infarction.  No exertional chest pain.  -  Continue Brilinta 60 mg bid (poor responder to Plavix) long-term given recurrent left main disease.  - Continue ASA 81, pravastin/Repatha/Vascepa (recent LP 8/23, LDL 39, TG improved but still elevated at 176).  - Continue Imdur 120 mg daily.  - Continue Toprol XL 25 mg daily.   - She was unable to tolerate ranolazine.  2. Bioprosthetic aortic valve replacement: In setting of bicuspid aortic valve, thoracic aorta not dilated on 2017 TEE.  Valve looked ok on 2/23 echo.   3. Chronic diastolic CHF: Restrictive cardiomyopathy.  Most recent echo in 2/23 shows normal LV systolic function, normal RV systolic function but D-shaped septum suggesting RV pressure/volume overload, mild MS, normal bioprosthetic AoV, no evidence for pericardial constriction. RHC in 8/20 was concerning for right > left heart failure, she did appear to have equalization of diastolic pressures concerning for restrictive cardiomyopathy, possibly related to prior radiation. CPX in 11/20 showed severe HF limitation.  She was [facility] at [facility] for heart/kidney transplant for CKD and restrictive cardiomyopathy, but extensive vascular calcification makes her a poor surgical candidate. Balance of renal failure and RV failure has been difficult. Creatinine 1.6 most recently.  Aggressive GDMT titration has been limited by CKD and issues w/ orthostasis.  - Euvolemic on exam and by ReDs (29%). Chronic NYHA Class II symptoms  - Continue torsemide, 20 mg  daily.  - Continue spironolactone 12.5 mg daily. BMET/BNP today.  - No Jardiance/Farxiga given use of insulin pump.   4. CKD:  Patient has a single kidney s/p right nephrectomy and suspected diabetic nephropathy.  CKD stage 4, slowly progressive. I have been concerned for cardiorenal syndrome in the setting of RV > LV failure/restrictive cardiomyopathy.   - Not candidate for PD given prior abdominal surgery.  - If CKD progresses, she may be a candidate for home HD.  - BMET today.  5.  Hyperlipidemia: She has not been able to tolerate any higher potency statin than pravastatin 10 mg daily.   - Most recent LP 8/23, LDL 39, TGs overall much improved but still mildly elevated at 176  - Continue pravastatin.  - Continue Repatha.   - She is on Vascepa for elevated TGs.  6. Anemia: Chronic/slow GI bleed.  Unremarkable EGD and capsule endoscopy in fall 2014.  Unfortunately, needs to stay on Brilinta long-term (Plavix non-responder).   7. Atrial fibrillation: Paroxysmal, very brief episodes noted on PCM interrogation. Given GI bleeding and need to be on Brilinta, risks have been thought to outweigh benefits of anticoagulation. She is not a good Multaq candidate with CHF.  If she needs an antiarrhythmic, consider Tikosyn or Sotalol but renal function may make these meds difficult to use.  Zio monitor in 3/23 showed < 1% AF, longest run 18 minutes. Denies recent symptoms of Afib. EKG today shows NSR. HR 77 bpm.  - We were unable to place Watchman device due to nickel allergy.   - Continue Toprol XL 25 mg daily.  8. Carotid bruit: Only mild stenosis on last dopplers in 3/18.  9. SSS: Pacemaker in place.  10. HTN: controlled. Continue current regimen.   11. OSA: Moderate, using Bipap.   12. ?TIA: Transient dysarthria in 3/18, could have been afib-related TIA.  No recurrent symptoms.  13. Thrombocytopenia: Chronic and mild. Follows with hematology.  14. 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  Mild on 2/23 echo.  She would not be a candidate for valve replacement.  80. Hematology: Recent labs showed mild hypercalcemia. Calcium level 10.4 with albumin 4.1. SPEP showed M spike of 0.2 g.  Kappa light chains at 123, lambda light chains 57 and ratio 2.17.  Immunofixation showed polyclonal gammopathy. Plan is to return in 6 months to repeat myeloma labs. - d/w Dr. Aundra Dubin. Currently Low suspicion for cardiac amyloidosis  - hematology will repeat myeloma labs in 6 months. We will revisit after f/u labs    F/u w/ Dr. Aundra Dubin in 3 months   Signed, [name] Jester, PA-C  06/13/2022  [facility] [place] [facility] [place] [address] [phone] (office) [phone] (fax)

## 2022-06-13 NOTE — Progress Notes (Signed)
ReDS Vest / Clip - 06/13/22 1500       ReDS Vest / Clip   Station Marker A    Ruler Value 28    ReDS Value Range Low volume    ReDS Actual Value 29

## 2022-06-18 ENCOUNTER — Telehealth (HOSPITAL_COMMUNITY): Payer: Self-pay

## 2022-06-18 DIAGNOSIS — E1021 Type 1 diabetes mellitus with diabetic nephropathy: Secondary | ICD-10-CM | POA: Diagnosis not present

## 2022-06-18 DIAGNOSIS — E1022 Type 1 diabetes mellitus with diabetic chronic kidney disease: Secondary | ICD-10-CM | POA: Diagnosis not present

## 2022-06-18 DIAGNOSIS — I5032 Chronic diastolic (congestive) heart failure: Secondary | ICD-10-CM

## 2022-06-18 DIAGNOSIS — Z9641 Presence of insulin pump (external) (internal): Secondary | ICD-10-CM | POA: Diagnosis not present

## 2022-06-18 DIAGNOSIS — N184 Chronic kidney disease, stage 4 (severe): Secondary | ICD-10-CM | POA: Diagnosis not present

## 2022-06-18 NOTE — Telephone Encounter (Signed)
-----   Message from Consuelo Pandy, [place] sent at 06/13/2022  4:53 PM EST ----- SCr elevated. Suspect dehydrated after recent diarrhea. Hold Lasix x 2 days, then restart.

## 2022-06-18 NOTE — Telephone Encounter (Signed)
lab appointment scheduled,lab orders entered  Orders Placed This Encounter  Procedures   Basic metabolic panel    Standing Status:   Future    Standing Expiration Date:   06/19/2023    Order Specific Question:   Release to patient    Answer:   Immediate    Order Specific Question:   Release to patient    Answer:   Immediate [1]

## 2022-06-19 DIAGNOSIS — E10649 Type 1 diabetes mellitus with hypoglycemia without coma: Secondary | ICD-10-CM | POA: Diagnosis not present

## 2022-06-25 ENCOUNTER — Ambulatory Visit (HOSPITAL_COMMUNITY)
Admission: RE | Admit: 2022-06-25 | Discharge: 2022-06-25 | Disposition: A | Discharge status: Home / Self Care | Payer: Medicare PPO | Source: Ambulatory Visit | Attending: Cardiology | Admitting: Cardiology

## 2022-06-25 DIAGNOSIS — I5032 Chronic diastolic (congestive) heart failure: Secondary | ICD-10-CM | POA: Insufficient documentation

## 2022-06-25 LAB — BASIC METABOLIC PANEL
Anion gap: 5 (ref 5–15)
BUN: 50 mg/dL — ABNORMAL HIGH (ref 8–23)
CO2: 23 mmol/L (ref 22–32)
Calcium: 8.8 mg/dL — ABNORMAL LOW (ref 8.9–10.3)
Chloride: 111 mmol/L (ref 98–111)
Creatinine, Ser: 2.23 mg/dL — ABNORMAL HIGH (ref 0.44–1.00)
GFR, Estimated: 23 mL/min — ABNORMAL LOW (ref >60)
Glucose, Bld: 88 mg/dL (ref 70–99)
Potassium: 4.1 mmol/L (ref 3.5–5.1)
Sodium: 139 mmol/L (ref 135–145)

## 2022-06-26 ENCOUNTER — Encounter (HOSPITAL_BASED_OUTPATIENT_CLINIC_OR_DEPARTMENT_OTHER): Payer: Self-pay | Admitting: Cardiology

## 2022-06-27 ENCOUNTER — Telehealth (HOSPITAL_COMMUNITY): Payer: Self-pay | Admitting: *Deleted

## 2022-06-27 NOTE — Telephone Encounter (Signed)
Pt left vm stating she is sob and having dizzy spells. She said she noticed her creatinine is still elevated.

## 2022-06-28 ENCOUNTER — Other Ambulatory Visit (HOSPITAL_COMMUNITY): Payer: Self-pay | Admitting: Cardiology

## 2022-06-28 ENCOUNTER — Other Ambulatory Visit (HOSPITAL_BASED_OUTPATIENT_CLINIC_OR_DEPARTMENT_OTHER): Payer: Self-pay | Admitting: Cardiology

## 2022-07-03 ENCOUNTER — Ambulatory Visit (INDEPENDENT_AMBULATORY_CARE_PROVIDER_SITE_OTHER): Payer: Medicare PPO

## 2022-07-03 DIAGNOSIS — I495 Sick sinus syndrome: Secondary | ICD-10-CM

## 2022-07-04 ENCOUNTER — Encounter (HOSPITAL_COMMUNITY): Payer: Self-pay | Admitting: Cardiology

## 2022-07-04 ENCOUNTER — Ambulatory Visit (HOSPITAL_COMMUNITY)
Admission: RE | Admit: 2022-07-04 | Discharge: 2022-07-04 | Disposition: A | Discharge status: Home / Self Care | Payer: Medicare PPO | Source: Ambulatory Visit | Attending: Cardiology | Admitting: Cardiology

## 2022-07-04 VITALS — BP 108/68 | HR 78 | Wt 113.8 lb

## 2022-07-04 DIAGNOSIS — Z7902 Long term (current) use of antithrombotics/antiplatelets: Secondary | ICD-10-CM | POA: Diagnosis not present

## 2022-07-04 DIAGNOSIS — Z85528 Personal history of other malignant neoplasm of kidney: Secondary | ICD-10-CM | POA: Diagnosis not present

## 2022-07-04 DIAGNOSIS — Z955 Presence of coronary angioplasty implant and graft: Secondary | ICD-10-CM | POA: Insufficient documentation

## 2022-07-04 DIAGNOSIS — N184 Chronic kidney disease, stage 4 (severe): Secondary | ICD-10-CM | POA: Insufficient documentation

## 2022-07-04 DIAGNOSIS — I495 Sick sinus syndrome: Secondary | ICD-10-CM | POA: Diagnosis not present

## 2022-07-04 DIAGNOSIS — I480 Paroxysmal atrial fibrillation: Secondary | ICD-10-CM | POA: Diagnosis not present

## 2022-07-04 DIAGNOSIS — Z951 Presence of aortocoronary bypass graft: Secondary | ICD-10-CM | POA: Insufficient documentation

## 2022-07-04 DIAGNOSIS — I25110 Atherosclerotic heart disease of native coronary artery with unstable angina pectoris: Secondary | ICD-10-CM | POA: Diagnosis not present

## 2022-07-04 DIAGNOSIS — Z950 Presence of cardiac pacemaker: Secondary | ICD-10-CM | POA: Diagnosis not present

## 2022-07-04 DIAGNOSIS — Z923 Personal history of irradiation: Secondary | ICD-10-CM | POA: Diagnosis not present

## 2022-07-04 DIAGNOSIS — Z953 Presence of xenogenic heart valve: Secondary | ICD-10-CM | POA: Insufficient documentation

## 2022-07-04 DIAGNOSIS — E785 Hyperlipidemia, unspecified: Secondary | ICD-10-CM | POA: Insufficient documentation

## 2022-07-04 DIAGNOSIS — E1022 Type 1 diabetes mellitus with diabetic chronic kidney disease: Secondary | ICD-10-CM | POA: Diagnosis not present

## 2022-07-04 DIAGNOSIS — I5032 Chronic diastolic (congestive) heart failure: Secondary | ICD-10-CM | POA: Insufficient documentation

## 2022-07-04 DIAGNOSIS — Q231 Congenital insufficiency of aortic valve: Secondary | ICD-10-CM | POA: Diagnosis not present

## 2022-07-04 DIAGNOSIS — D472 Monoclonal gammopathy: Secondary | ICD-10-CM | POA: Insufficient documentation

## 2022-07-04 DIAGNOSIS — G4733 Obstructive sleep apnea (adult) (pediatric): Secondary | ICD-10-CM | POA: Diagnosis not present

## 2022-07-04 DIAGNOSIS — Z794 Long term (current) use of insulin: Secondary | ICD-10-CM | POA: Diagnosis not present

## 2022-07-04 DIAGNOSIS — Z9641 Presence of insulin pump (external) (internal): Secondary | ICD-10-CM | POA: Insufficient documentation

## 2022-07-04 DIAGNOSIS — Z79899 Other long term (current) drug therapy: Secondary | ICD-10-CM | POA: Diagnosis not present

## 2022-07-04 DIAGNOSIS — R42 Dizziness and giddiness: Secondary | ICD-10-CM | POA: Diagnosis present

## 2022-07-04 DIAGNOSIS — I425 Other restrictive cardiomyopathy: Secondary | ICD-10-CM | POA: Insufficient documentation

## 2022-07-04 DIAGNOSIS — I130 Hypertensive heart and chronic kidney disease with heart failure and stage 1 through stage 4 chronic kidney disease, or unspecified chronic kidney disease: Secondary | ICD-10-CM | POA: Diagnosis not present

## 2022-07-04 LAB — CUP PACEART REMOTE DEVICE CHECK
Battery Impedance: 1182 Ohm
Battery Remaining Longevity: 63 mo
Battery Voltage: 2.78 V
Brady Statistic AP VP Percent: 0 %
Brady Statistic AP VS Percent: 1 %
Brady Statistic AS VP Percent: 0 %
Brady Statistic AS VS Percent: 99 %
Date Time Interrogation Session: 20231213103310
Implantable Lead Connection Status: 753985
Implantable Lead Connection Status: 753985
Implantable Lead Implant Date: 20140130
Implantable Lead Implant Date: 20140130
Implantable Lead Location: 753859
Implantable Lead Location: 753860
Implantable Lead Model: 5076
Implantable Lead Model: 5076
Implantable Pulse Generator Implant Date: 20140130
Lead Channel Impedance Value: 401 Ohm
Lead Channel Impedance Value: 541 Ohm
Lead Channel Pacing Threshold Amplitude: 0.5 V
Lead Channel Pacing Threshold Amplitude: 0.625 V
Lead Channel Pacing Threshold Pulse Width: 0.4 ms
Lead Channel Pacing Threshold Pulse Width: 0.4 ms
Lead Channel Setting Pacing Amplitude: 1.5 V
Lead Channel Setting Pacing Amplitude: 2.5 V
Lead Channel Setting Pacing Pulse Width: 0.4 ms
Lead Channel Setting Sensing Sensitivity: 5.6 mV
Zone Setting Status: 755011
Zone Setting Status: 755011

## 2022-07-04 LAB — CBC
HCT: 34 % — ABNORMAL LOW (ref 36.0–46.0)
Hemoglobin: 11 g/dL — ABNORMAL LOW (ref 12.0–15.0)
MCH: 29.6 pg (ref 26.0–34.0)
MCHC: 32.4 g/dL (ref 30.0–36.0)
MCV: 91.4 fL (ref 80.0–100.0)
Platelets: 128 10*3/uL — ABNORMAL LOW (ref 150–400)
RBC: 3.72 MIL/uL — ABNORMAL LOW (ref 3.87–5.11)
RDW: 14.5 % (ref 11.5–15.5)
WBC: 9.4 10*3/uL (ref 4.0–10.5)
nRBC: 0 % (ref 0.0–0.2)

## 2022-07-04 LAB — BASIC METABOLIC PANEL
Anion gap: 10 (ref 5–15)
BUN: 49 mg/dL — ABNORMAL HIGH (ref 8–23)
CO2: 24 mmol/L (ref 22–32)
Calcium: 10.1 mg/dL (ref 8.9–10.3)
Chloride: 106 mmol/L (ref 98–111)
Creatinine, Ser: 1.87 mg/dL — ABNORMAL HIGH (ref 0.44–1.00)
GFR, Estimated: 29 mL/min — ABNORMAL LOW (ref >60)
Glucose, Bld: 191 mg/dL — ABNORMAL HIGH (ref 70–99)
Potassium: 4.6 mmol/L (ref 3.5–5.1)
Sodium: 140 mmol/L (ref 135–145)

## 2022-07-04 LAB — BRAIN NATRIURETIC PEPTIDE: B Natriuretic Peptide: 184.8 pg/mL — ABNORMAL HIGH (ref 0.0–100.0)

## 2022-07-04 MED ORDER — SPIRONOLACTONE 25 MG PO TABS: 12.5000 mg | ORAL_TABLET | Freq: Every evening | ORAL | 0 refills | Status: DC

## 2022-07-04 MED ORDER — TORSEMIDE 20 MG PO TABS: 40.0000 mg | ORAL_TABLET | Freq: Every day | ORAL | 3 refills | Status: DC

## 2022-07-04 NOTE — Progress Notes (Signed)
Date:  07/04/2022   ID:  [name] [name], DOB 1951-01-28, MRN [id number]   Provider location: [place] Type of Visit: Established patient  PCP:  [name] Macadam, MD  Cardiologist:  None Primary HF: Dr. Aundra Dubin   History of Present Illness: [name] [name] is a 71 y.o. female who has a history of CAD s/p CABG in 12/05 and redo in 1/11, bioprosthetic AVR, diastolic CHF, and sick sinus syndrome s/p PPM presents for cardiology followup.  She had initial CABG with SVG-RCA in 12/05 followed by redo SVG-RCA and bioprosthetic AVR in 1/11.  She has had diastolic CHF and is on Lasix.  In 9/14, she had a CPX test.  This showed severe functional limitation with ischemic ECG changes (inferolateral ST depression and ST elevation in V1 and V2). She had chest pain.  She had R/LHC in 9/14.  This showed elevated left and right heart filling pressures, patent SVG-PDA, and 80% distal LM/80% ostial LAD stenosis.  She was not a candidate for redo CABG (had 2 prior sternotomies as well as chest radiation for Hodgkins lymphoma).  She had DES from left main into proximal LAD and will need long-term DAPT => Brilinta was used as she has been a poor responder to Plavix in the past.  She re-developed angina in 11/15 and was admitted with unstable angina.  LHC showed 80% ostial LM and patent SVG-RCA. She had DES to ostial LM. Echo in 1/18 showed EF 60-65%, normal bioprosthetic aortic valve, mild-moderate MR, PASP 35 mmHg.    She has chronic iron deficiency anemia thought to be due to a slow GI bleed.  Last EGD was unremarkable and a capsule endoscopy was also unremarkable.  She has had periodic transfusions.     She had bilateral mastectomy for DCIS in 99/24 without complication.   She has an insulin pump followed by an endocrinologist at [facility].    Given myalgias with statins, she was started on Praluent.  LDL is now excellent.     In the past she has had short runs of atrial fibrillation.   She is not anticoagulated. Planned for Watchman placement, but given her nickel allergy, we were unable to place a Watchman.      Diagnosed with OSA, now on CPAP.    Given ongoing problems with exertional dyspnea and occasional chest pain, Cardiolite was repeated in 9/18 and showed inferior changes.  She underwent left/right heart cath in 9/18.  This showed elevated filling pressures, R>L.  There was 75+% ostial LCx stenosis.  However, there were 2 layers of stent from the mid left main into the ostial LAD, and LCx intervention was thought to be very risky.  It was decided to manage her medically.  Echo was repeated in 10/18 showing EF 55-60%, moderate to severe TR.     Given recurrent chest pain, she had ETT-Cardiolite done in 3/19.  This was a normal study with no evidence for ischemia or infarction.  Echo in 1/20 showed EF 55-60%, mild LVH, stable bioprosthetic aortic valve, moderate-severe TR.    Due to increased dyspnea and worsening renal function, she was set up for echo and RHC.  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  [place] in 8/20 showed primarily right-sided HF.  Cardiolite in 8/20  showed no ischemia or infarction.  Torsemide was increased and she cut back on sodium in her diet.    CPX in 11/20 showed severe functional limitation from HF.   She saw Dr. Mosetta Pigeon at [facility] for evaluation for heart/kidney transplant (for restrictive cardiomyopathy).  She was determined to be a poor surgical candidate due to extensive vascular calcifications.   CT chest in 8/21 showed a loculated right pleural effusion but unable to find enough fluid by ultrasound for thoracentesis.   Echo in 9/21 showed EF 60-65% with mild LVH, mild RV dilation with mildly decreased systolic  function and a D-shaped septum, PASP 40, normal bioprosthetic aortic valve, rheumatic-appearing MV with mild-moderate mitral stenosis (mean gradient 9 but MVA by PHT 2.7 cm^2), trivial MR.    Echo 2/23, EF 60-65%, mild LVH, normal RV systolic function but with D-shaped septum, normal bioprosthetic aortic valve, mild mitral stenosis (rheumatic-appearing valve, mean gradient 4 mmHg), mild MR.   Seen by hematology. Labs showed mild hypercalcemia. Calcium level 10.4 with albumin 4.1. SPEP showed M spike.  Kappa/lambda light chain ratio 2.17. Immunofixation showed polyclonal gammopathy. Plan is to return in 6 months to repeat myeloma labs. She has not had a cMRI due to PPM.   She presents today for followup of CHF.  Immediately after Thanksgiving, she began to feel worse than baseline.  She was lightheaded with bending over then standing up.  She had worse fatigue.  She was more short of breath => noted with mopping, carrying groceries, laundary.  She also noted chest tightness with heavy exertion.  This has improved somewhat over the last week.  However, she still gets short of breath after walking about 50 yards.  No orthopnea/PND.  Weight is down 2 lbs.   REDS clip 36%  ECG (personally reviewed): NSR, 1st degree AVB, LAFB, LVH  MDT device interrogation: 0.3% v-pacing   Labs (2/19): hgb 13.2 Labs @ [facility] (3/19): K 4.7, creatinine 1.4, ANA negative, RF negative Labs (4/19): K 3.9, creatinine 1.52 => 1.63 Labs (6/19): LDL 8, HDL 42, TGs 259 Labs (9/19): K 4.9, creatinine 1.65, hgb 13.4, plts 129, LDL 46,TGs 182 Labs (12/19): K 4.9, creatinine 2.07, hgb 12.4 Labs (3/20): K 4, creatinine 1.8 Labs (6/20): K 4.7, creatinine 1.47 Labs (8/20): creatinine 2.13 => 2.46 => 2.4, LDL 22 Labs (9/20): K 4.8, creatinine 2.74, AST/ALT normal, hgb 12.6 Labs (1/21): K 4.1, creatinine 1.86, hgb 11.7 Labs (4/21): K 4.7, creatinine 1.7 Labs (6/21): K 4.2, creatinine 2 => 1.7 Labs (8/21): K 3.8, creatinine 1.69, hgb  10.6, plts 116 Labs (9/21): LDL 34, TGs 209 Labs (1/22): K 3.8, creatinine 2.0 Labs (3/22): LDL 25 Labs (4/22): K 4.7, creatinine 2.0, hgb 12.3 Labs (5/22): K 4.4, creatinine 2.21 Labs (8/22): LDL 1, TGs 330 Labs (10/22): K 4.6, creatinine 1.87 Labs (11/22): LDL 26 Labs (12/22): K 4.1, creatinine 2.25 Labs (1/23): creatinine 2.65 Labs (3/23): K 4.6, creatinine 2.8 Labs (4/23): K 4.1, creatinine 2.16, BNP 94 Labs (10/23): K 4.2, creatinine 1.62, calcium 10.4  Labs (12/23): K 4.1, creatinine 2.23   PMH: 1. Sick sinus syndrome s/p [employer] PCM.  2. HTN 3. H/o SBO 4. Renal cell carcinoma s/p right nephrectomy in 2008.  5. 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  Fibroscan in 11/14 showed advanced fibrosis concerning for cirrhosis. EGD in 11/14 showed no varices.  6. Diastolic CHF: Echo (2/22) with EF 60-65%, bioprosthetic aortic valve with mean gradient 15 mmHg, mild MR.  CPX (9/14): peak VO2 10.4,  VE/VCO2 43.8, inferolateral ST depression and ST elevation in V1/V2 with severe dyspnea and chest pain, severe functional limitation with ischemic changes.  Echo (9/14) with EF 60-65%, normal RV size and systolic function, mild-moderate MR, bioprosthetic aortic valve normal, PA systolic pressure 51 mmHg, no evidence for pericardial constriction though exam incomplete for constriction.  Echo (11/15) with EF 55-60%, bioprosthetic aortic valve, mild-moderate MR, moderate TR, PA systolic pressure 42 mmHg.  - TEE (5/17) with EF 60-65%, normal bioprosthetic aortic valve with mean gradient 13 mmHg, normal RV size and systolic function. - Echo (1/18) with EF 60-65%, normal bioprosthetic aortic valve, mild-moderate MR, PASP 35 mmHg.  - Echo (10/18) with EF 55-60%, bioprosthetic aortic valve mean gradient 10 mmHg, MAC with mild mitral stenosis (mean gradient 7) and mild mitral regurgitation, moderate to severe TR, PASP 43 mmHg.  - RHC (9/18): mean RA 13, PA 44/18 mean 33, mean PCWP 19, CI 3.45, PVR 2.7 WU.  -  Echo (1/20): EF 55-60%, mild LVH, mild MR, stable bioprosthetic aortic valve, moderate-severe TR.  - RHC (8/20): mean RA 14, PA 43/15 mean 28, mean PCWP 13, PAPI 2, PVR 4.2, CI 2.31 - Echo (8/20): EF 55-60%, RV mildly dilated with normal systolic function, mildly D-shaped septum suggestive of RV pressure/volume overload, rheumatic-appearing mitral valve with mild mitral stenosis (mean gradient 6, MVA by PHT 4 cm^2), no evidence for pericardial constriction, moderate TR, bioprosthetic aortic valve functioning normally with mean gradient 6 mmHg.  - CPX (11/20): peak VO2 9.8, VE/VCO2 slope 38, RER 1.11 => severe functional limitation from HF.   - Echo (9/21): EF 60-65% with mild LVH, mild RV dilation with mildly decreased systolic function and a D-shaped septum, PASP 40, normal bioprosthetic aortic valve, rheumatic-appearing MV with mild-moderate mitral stenosis (mean gradient 9 but MVA by PHT 2.7 cm^2, trivial MR.  - Echo (2/23): EF 60-65%, mild LVH, normal RV systolic function but with D-shaped septum, normal bioprosthetic aortic valve, mild mitral stenosis (rheumatic-appearing valve, mean gradient 4 mmHg), mild MR.  7. TAH-BSO 8. Type I diabetes: Has insulin pump.  9. H/o ischemic colitis. 10. H/o PUD. 11. Diabetic gastroparesis. 12. Hyperlipidemia: Myalgias with Crestor and atorvastatin, elevated LFTs with simvastatin.  Myalgias with > 10 mg pravastatin. Now on Praluent.  13. CKD stage IV: Sees Dr. Lorrene Reid 14. Bicuspid aortic valve s/p 19 mm Edwards pericardial valve in 1/11 (had post-operative Dresslers syndrome).   15. CAD: CABG with SVG-RCA in 12/05.  Redo SVG-RCA in 1/11 with AVR.  LHC/RHC (9/14) with mean RA 18, PA 62/25 mean 43, mean PCWP 26, CI 2.8, 70-80% distal LM stenosis, 80% ostial LAD stenosis, total occlusion RCA, SVG-PDA patent.  Patient had DES LM into LAD.  Plan to continue long-term ASA/Brilinta (poor responder to Plavix).  ETT-cardiolite (4/15) with 6' exercise, EF 60%, ST depression  in recovery and mild chest pain, no ischemia or infarction by perfusion images.  Unstable angina 11/15 with 80% LM, patent SVG-RCA => DES to ostial LM.   - Lexiscan Cardiolite (2/18): EF 62%, no ischemia or infarction (normal).  She had a significant reaction to [place] and probably should not get again.  - Cardiolite (9/18): EF 72%, prior inferior infarct with mild peri-infarct ischemia.  - LHC (9/18): 2 layers stent left main - ostial LAD with 50% dLM in-stent restenosis, 75+% ostial LCx, totally occluded RCA, SVG-RCA patent.  Medical management planned.  - ETT-Cardiolite (4/19): EF normal, 7'11" exercise, no ischemia or infarction.  - Lexiscan Cardiolite (8/20): No ischemia/infarction.  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  17. Anemia: Iron-deficiency.  Suspect chronic GI blood loss.  EGD in 9/14 was unremarkable.  Capsule endoscopy 10/14 was unremarkable.  Has required periodic blood transfusions.  18. Atrial fibrillation: Paroxysmal, noted by [facility] interrogation.  - Zio monitor 3/23: < 1% atrial fibrillation, longest run was 18 minutes.  19. Carotid stenosis: Carotid dopplers (2/15) with 40-59% bilateral stenosis. Carotid dopplers (3/16) with 40-59% bilateral ICA stenosis. Carotid dopplers (3/17) with 40-59% bilateral ICA stenosis.  - Carotid dopplers (3/18) with mild BICA stenosis.  20. 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  22. C difficile colitis 2017 23. Gout 24. ?TIA: head CT negative.  25. OSA: Moderate, uses CPAP.  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 Hypercalcemia 28. 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  Mild-moderate on 9/21 echo.  Mild on 2/23 echo.  29. Tricuspid regurgitation: Moderate by 8/20 echo.  30. Lung nodules: Followed at [facility] => stable.  31. ?Parkinsonism: She did not  tolerate Sinemet.   Current Outpatient Medications  Medication Sig Dispense Refill   acyclovir ointment (ZOVIRAX) 5 % Apply 1 application topically every 3 (three) hours as needed (fever blister/cold sores).     ALPRAZolam (XANAX) 0.5 MG tablet Take 1 tablet (0.5 mg total) by mouth 2 (two) times daily as needed for anxiety. 60 tablet 1   APIDRA 100 UNIT/ML injection Inject 40 Units into the skin as directed. INFUSE THROUGH INSULIN PUMP UTD, [place]us depends on carb intake  2   aspirin EC 81 MG tablet Take 81 mg by mouth at bedtime.     bisacodyl (DULCOLAX) 5 MG EC tablet Take 1 tablet (5 mg total) by mouth daily as needed for moderate constipation. 30 tablet 0   BRILINTA 60 MG TABS tablet TAKE ONE TABLET (60MG TOTAL) BY MOUTH TWICE DAILY 180 tablet 3   cetirizine (ZYRTEC) 10 MG tablet Take 10 mg by mouth daily.     clobetasol (OLUX) 0.05 % topical foam Apply 1 application topically 2 (two) times daily as needed (skin flaking).     Coenzyme Q10 (CO Q 10) 100 MG CAPS Take 100 mg by mouth at bedtime.      colchicine 0.6 MG tablet Take 0.6 mg by mouth daily as needed (gout flare).     denosumab (PROLIA) 60 MG/ML SOSY injection Inject 60 mg into the skin every 6 (six) months.     diclofenac Sodium (VOLTAREN) 1 % GEL Apply 2 g topically 4 (four) times daily. As needed     escitalopram (LEXAPRO) 5 MG tablet Take 5 mg by mouth daily.     famotidine (PEPCID) 40 MG tablet Take 40 mg by mouth daily. Takes a second tablet at night if needed.     glucagon (GLUCAGON EMERGENCY) 1 MG injection Inject 1 mg into the vein once as needed (low blood sugar).     isosorbide mononitrate (IMDUR) 120 MG 24 hr tablet TAKE ONE TABLET (120MG TOTAL) BY MOUTH DAILY 90 tablet 3   latanoprost (XALATAN) 0.005 % ophthalmic solution Place 1 drop into both eyes at bedtime.     metolazone (ZAROXOLYN) 2.5 MG tablet Take 2.5 mg (1 tab) once as directed by CHF clinic. Call before taking [phone] 3 tablet 0   metoprolol succinate  (TOPROL XL) 25 MG 24 hr tablet Take 1 tablet (25 mg total) by mouth daily. [place]  tablet 3   mometasone (NASONEX) 50 MCG/ACT nasal spray Place 2 sprays into the nose daily as needed (allergy).     mupirocin cream (BACTROBAN) 2 % Apply 1 application topically 2 (two) times daily as needed. 90 g 0   nitroGLYCERIN (NITROLINGUAL) 0.4 MG/SPRAY spray PLACE 1 SPRAY UNDER THE TONGUE EVERY 5 MINUTES FOR 3 DOSES AS NEEDED FOR CHEST PAIN. 14.7 g 0   polyethylene glycol (MIRALAX / GLYCOLAX) 17 g packet Take 17 g by mouth as needed.     pravastatin (PRAVACHOL) 10 MG tablet TAKE ONE TABLET (10 MG TOTAL) BY MOUTH DAILY. 90 tablet 3   Probiotic Product (ALIGN PO) Take 1 tablet by mouth daily in the afternoon.     REPATHA SURECLICK 979 MG/ML SOAJ INJECT 140MG INTO THE SKIN EVERY 14 DAYS 6 mL 3   rOPINIRole (REQUIP) 0.25 MG tablet Take 1 tablet (0.25 mg total) by mouth at bedtime. Please call to schedule an overdue appointment with Dr. Radford Pax for refills, [phone], thank you. 1st attempt 30 tablet 0   senna (SENOKOT) 8.6 MG tablet Take 1 tablet by mouth as needed for constipation.     silver nitrate applicators 82-70 % applicator Use as needed to stop superficial skin bleeding. 10 each 0   ULORIC 40 MG tablet Take 1 tablet (40 mg total) by mouth at bedtime. 90 tablet 3   VASCEPA 1 g capsule TAKE TWO CAPSULES (2 GRAMS TOTAL) BY MOUTH TWO TIMES DAILY 360 capsule 3   Wheat Dextrin (BENEFIBER) POWD Take 1 Dose by mouth daily as needed.     spironolactone (ALDACTONE) 25 MG tablet Take 0.5 tablets (12.5 mg total) by mouth at bedtime. 45 tablet 0   torsemide (DEMADEX) 20 MG tablet Take 2 tablets (40 mg total) by mouth daily. 180 tablet 3   No current facility-administered medications for this encounter.    Allergies:   Avandia [rosiglitazone maleate], Cephalexin, Clindamycin, Clindamycin hcl, Humalog [insulin lispro], Lincomycin, Metformin, Novolog [insulin aspart], Penicillins, Rosiglitazone, Tizanidine, Adhesive  [tape], Atorvastatin, Cholestyramine, Gentamycin [gentamicin], Iodinated contrast media, Limonene, Prednisone, Ranexa [ranolazine], Rosuvastatin, Simvastatin, Azelastine, Clopidogrel, Kerendia [finerenone], Lisinopril, Oseltamivir phosphate, Tizanidine hcl, Codeine, Erythromycin, Hydrocodone-acetaminophen, Latex, Neomycin, Nickel, Ranitidine, Tamiflu [oseltamivir], and Tramadol   Social History:  The patient  reports that she has never smoked. She has never used smokeless tobacco. She reports that she does not drink alcohol and does not use drugs.   Family History:  The patient's family history includes Anal fissures in her mother; Breast cancer (age of onset: 32) in her sister; Cancer (age of onset: 75) in her sister; Cancer (age of onset: 42) in her maternal grandfather; Diabetes in her father; Glaucoma in her brother; Heart attack in her father, maternal grandmother, and mother; Heart disease in her father and mother; Hypertension in her brother; Lung cancer in her sister; Parkinson's disease in her paternal aunt.   ROS:  Please see the history of present illness.   All other systems are personally reviewed and negative.   Exam:   BP 108/68   Pulse 78   Wt 51.6 kg (113 lb 12.8 oz)   SpO2 97%   BMI 20.81 kg/m  General: NAD Neck: JVP 8-9 cm, no thyromegaly or thyroid nodule.  Lungs: Clear to auscultation bilaterally with normal respiratory effort. CV: Nondisplaced PMI.  Heart regular S1/S2, no S3/S4, 2/6 SEM RUSB, 1/6 diastolic murmur.  No peripheral edema.  No carotid bruit.  Normal pedal pulses.  Abdomen: Soft, nontender, no hepatosplenomegaly, no distention.  Skin: Intact without lesions or rashes.  Neurologic: Alert and oriented x 3.  Psych: Normal affect. Extremities: No clubbing or cyanosis.  HEENT: Normal.   Wt Readings from Last 3 Encounters:  07/04/22 51.6 kg (113 lb 12.8 oz)  06/13/22 52.2 kg (115 lb)  05/28/22 48.1 kg (106 lb)    ASSESSMENT AND PLAN:  1. CAD: s/p SVG-RCA  in 12/05, then redo SVG-RCA in 1/11--> distal left main/proximal LAD severe stenosis in 9/14.  PCI with DES to distal LM/proximal LAD was done rather than bypass as she would have been a poor candidate for redo sternotomy (2 prior sternotomies and history of chest wall radiation for Hodgkins disease).  Recurrent unstable angina with 80% ostial LM treated with repeat DES in 11/15.  Lexiscan Cardiolite in 9/18 inferior changes.  Repeat LHC (9/18) showed 50% distal left main in-stent restenosis and 75+% ostial LCx stenosis. I reviewed the films at the time with interventional cardiology => would be very difficult to intervene on the ostial LCx through 2 overlapping stents from left main into the LAD.  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 Given no ACS, we planned medical management and increased Imdur.  Cardiolite in 3/19 and again in 8/20 showed no ischemia or infarction.  She has had some chest pain with heavy exertion, this seems chronic.  - Continue Brilinta 60 mg bid (poor responder to Plavix) long-term given recurrent left main disease.  - Continue ASA 81, pravastin/Repatha/Vascepa (recent LP 8/23, LDL 39, TG improved but still elevated at 176).  - Continue Imdur 120 mg daily.  - Continue Toprol XL 25 mg daily.   - She was unable to tolerate ranolazine.  2. Bioprosthetic aortic valve replacement: In setting of bicuspid aortic valve, thoracic aorta not dilated on 2017 TEE.  Valve looked ok on 2/23 echo.   3. Chronic diastolic CHF: Restrictive cardiomyopathy.  Most recent echo in 2/23 shows normal LV systolic function, normal RV systolic function but D-shaped septum suggesting RV pressure/volume overload, mild MS, normal bioprosthetic AoV, no evidence for pericardial constriction. RHC in 8/20 was concerning for right > left heart failure, she did appear to have equalization of diastolic pressures concerning for restrictive cardiomyopathy, possibly related to prior  radiation. CPX in 11/20 showed severe HF limitation.  She was evaluated at [facility] for heart/kidney transplant for CKD and restrictive cardiomyopathy, but extensive vascular calcification makes her a poor surgical candidate. Balance of renal failure and RV failure has been difficult. Creatinine 2.2 most recently.  She is at least mildly volume overloaded today by exam and REDS clip, NYHA class III symptoms.  - Increase torsemide to 40 mg daily with BMET/BNP today and BMET in 10 days.  - Continue spironolactone 12.5 mg daily. Take qhs to limit BP drop during the day.   - No Jardiance/Farxiga given use of insulin pump.   - I will arrange for repeat echo with worsening symptoms.  4. CKD:  Patient has a single kidney s/p right nephrectomy and suspected diabetic nephropathy.  CKD stage 4, slowly progressive. I have been concerned for cardiorenal syndrome in the setting of RV > LV failure/restrictive cardiomyopathy.   - Not candidate for PD given prior abdominal surgery.  - If CKD progresses, she may be a candidate for home HD.  - BMET today.  5. Hyperlipidemia: She has not been able to tolerate any higher potency statin than pravastatin 10 mg daily.  Most recent lipids 8/23 with LDL 39, TGs overall  much improved but still mildly elevated at 176  - Continue pravastatin.  - Continue Repatha.   - She is on Vascepa for elevated TGs.  6. Anemia: Chronic/slow GI bleed.  Unremarkable EGD and capsule endoscopy in fall 2014.  Unfortunately, needs to stay on Brilinta long-term (Plavix non-responder).   - CBC today.  7. Atrial fibrillation: Paroxysmal, very brief episodes noted on PCM interrogation. Given GI bleeding and need to be on Brilinta, risks have been thought to outweigh benefits of anticoagulation. She is not a good Multaq candidate with CHF.  If she needs an antiarrhythmic, consider Tikosyn or Sotalol but renal function may make these meds difficult to use.  Zio monitor in 3/23 showed < 1% AF, longest run 18  minutes. Denies recent symptoms of Afib. EKG today shows NSR.  - We were unable to place Watchman device due to nickel allergy.   - Continue Toprol XL 25 mg daily.  8. Carotid bruit: Only mild stenosis on last dopplers in 3/18.  9. SSS: Pacemaker in place.  10. HTN: controlled. Continue current regimen.   11. OSA: Moderate, using Bipap.   12. ?TIA: Transient dysarthria in 3/18, could have been afib-related TIA.  No recurrent symptoms.  13. Thrombocytopenia: Chronic and mild. Follows with hematology.  14. 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  Mild on 2/23 echo.  She would not be a candidate for valve replacement.  59. Hematology: MGUS noted, has seen hematology.  Low suspicion for cardiac amyloidosis  - hematology will repeat myeloma labs in 6 months.   Followup in 1 month with APP.   Signed, [name], MD  07/04/2022  [facility] [address] [facility] [place] [address] [phone] (office) [phone] (fax)

## 2022-07-04 NOTE — Patient Instructions (Signed)
INCREASE Torsemide to 40 mg daily.  CHANGE Spironolactone to nightly.  Labs done today, your results will be available in MyChart, we will contact you for abnormal readings.  Repeat blood work in 10 days.  Your physician has requested that you have an echocardiogram. Echocardiography is a painless test that uses sound waves to create images of your heart. It provides your doctor with information about the size and shape of your heart and how well your heart's chambers and valves are working. This procedure takes approximately one hour. There are no restrictions for this procedure. Please do NOT wear cologne, perfume, aftershave, or lotions (deodorant is allowed). Please arrive 15 minutes prior to your appointment time.   Your physician recommends that you schedule a follow-up appointment in: 1 month  If you have any questions or concerns before your next appointment please send [name] a message through [place] or call our office at [phone].    TO LEAVE A MESSAGE FOR THE NURSE SELECT OPTION 2, PLEASE LEAVE A MESSAGE INCLUDING: YOUR NAME DATE OF BIRTH CALL BACK NUMBER REASON FOR CALL**this is important as we prioritize the call backs  YOU WILL RECEIVE A CALL BACK THE SAME DAY AS LONG AS YOU CALL BEFORE 4:00 PM  At the [facility], you and your health needs are our priority. As part of our continuing mission to provide you with exceptional heart care, we have created designated Provider Care Teams. These Care Teams include your primary Cardiologist (physician) and Advanced Practice Providers (APPs- Physician Assistants and Nurse Practitioners) who all work together to provide you with the care you need, when you need it.   You may see any of the following providers on your designated Care Team at your next follow up: Dr Glori Bickers Dr [name] Dr. Roxana Hires, NP Lyda Jester, [place] [facility] [place], [place] Forestine Na,  NP Audry Riles, PharmD   Please be sure to bring in all your medications bottles to every appointment.

## 2022-07-04 NOTE — Progress Notes (Signed)
ReDS Vest / Clip - 07/04/22 1500       ReDS Vest / Clip   Station Marker A    Ruler Value 28    ReDS Value Range Moderate volume overload    ReDS Actual Value 36

## 2022-07-05 DIAGNOSIS — I495 Sick sinus syndrome: Secondary | ICD-10-CM | POA: Diagnosis not present

## 2022-07-05 DIAGNOSIS — Z950 Presence of cardiac pacemaker: Secondary | ICD-10-CM | POA: Diagnosis not present

## 2022-07-05 DIAGNOSIS — Z01818 Encounter for other preprocedural examination: Secondary | ICD-10-CM | POA: Diagnosis not present

## 2022-07-05 DIAGNOSIS — I083 Combined rheumatic disorders of mitral, aortic and tricuspid valves: Secondary | ICD-10-CM | POA: Diagnosis not present

## 2022-07-05 DIAGNOSIS — K7460 Unspecified cirrhosis of liver: Secondary | ICD-10-CM | POA: Diagnosis not present

## 2022-07-05 DIAGNOSIS — R9431 Abnormal electrocardiogram [ECG] [EKG]: Secondary | ICD-10-CM | POA: Diagnosis not present

## 2022-07-05 DIAGNOSIS — I509 Heart failure, unspecified: Secondary | ICD-10-CM | POA: Diagnosis not present

## 2022-07-05 DIAGNOSIS — Z01810 Encounter for preprocedural cardiovascular examination: Secondary | ICD-10-CM | POA: Diagnosis not present

## 2022-07-05 DIAGNOSIS — I4891 Unspecified atrial fibrillation: Secondary | ICD-10-CM | POA: Diagnosis not present

## 2022-07-09 DIAGNOSIS — H401131 Primary open-angle glaucoma, bilateral, mild stage: Secondary | ICD-10-CM | POA: Diagnosis not present

## 2022-07-10 ENCOUNTER — Telehealth: Payer: Self-pay | Admitting: Neurology

## 2022-07-10 ENCOUNTER — Encounter: Payer: Self-pay | Admitting: Cardiology

## 2022-07-10 ENCOUNTER — Ambulatory Visit: Payer: Medicare PPO | Admitting: Cardiology

## 2022-07-10 ENCOUNTER — Ambulatory Visit (INDEPENDENT_AMBULATORY_CARE_PROVIDER_SITE_OTHER): Payer: Medicare PPO | Admitting: Neurology

## 2022-07-10 ENCOUNTER — Encounter: Payer: Self-pay | Admitting: Neurology

## 2022-07-10 ENCOUNTER — Ambulatory Visit: Payer: Medicare PPO | Attending: Cardiology | Admitting: Cardiology

## 2022-07-10 VITALS — BP 120/71 | HR 80 | Ht 63.0 in | Wt 111.0 lb

## 2022-07-10 VITALS — BP 134/67 | HR 76 | Ht 63.0 in | Wt 111.0 lb

## 2022-07-10 DIAGNOSIS — G2581 Restless legs syndrome: Secondary | ICD-10-CM

## 2022-07-10 DIAGNOSIS — G4733 Obstructive sleep apnea (adult) (pediatric): Secondary | ICD-10-CM | POA: Diagnosis not present

## 2022-07-10 DIAGNOSIS — I10 Essential (primary) hypertension: Secondary | ICD-10-CM | POA: Diagnosis not present

## 2022-07-10 DIAGNOSIS — G20C Parkinsonism, unspecified: Secondary | ICD-10-CM

## 2022-07-10 NOTE — Addendum Note (Signed)
Addended by: Thompson Grayer on: 07/10/2022 01:17 PM   Modules accepted: Orders

## 2022-07-10 NOTE — Telephone Encounter (Signed)
Referral for Neurology fax to [facility] as requested by neurologist. Phone: [phone], Fax: [phone]

## 2022-07-10 NOTE — Progress Notes (Signed)
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   Cerebellar testing: No dysmetria or intention tremor. There is no truncal or gait ataxia.  Sensory exam: intact to light touch in the upper and lower extremities.  Gait, station and balance: She stands slowly, she stands initially slightly wide-based but is able to stand with narrow base.  She walks slowly, mild decrease in arm swing noted.  Balance is mildly impaired.  No telltale shuffling, no walking aid.                Assessment and Plan:  In summary, [name] [name] is a very pleasant 71 year old female with an underlying complex medical history of aortic stenosis, renal cell cancer, breast cancer, chronic diastolic congestive heart failure, chronic kidney disease, ischemic colitis, coronary artery disease, diabetes, duodenal ulcer, gastroparesis, Hodgkin's disease, hyperlipidemia, hypertension, anemia, migraine headaches, sleep apnea, on BiPAP therapy, pacemaker, paroxysmal A. fib, small bowel obstruction, NASH, history of syncope, status post multiple surgeries including CABG, AVR,  loop recorder placement, left mastectomy, right nephrectomy, Hodgkin's lymphoma surgery, pacemaker placement, small bowel lipoma resection, who presents for follow-up consultation of her fine motor dyscontrol, balance issues, and tremors.  She has restless leg syndrome and is stable on low-dose ropinirole through her primary care.  She has evidence of parkinsonism without telltale history and examination findings of idiopathic Parkinson's disease.  She tried levodopa in low-dose in 2022 but could not tolerate the medication secondary to several side effects including sedation primarily.  We did not pursue a DaTscan due to impaired kidney function.  I suggested we pursue a referral to a movement disorder specialist through an academic institution, she would like to go to [facility], she has several specialist providers at [facility] already.  I made a referral in that regard, she can see Dr. Jennelle Human or another specialist if possible.  We mutually agreed to forego any additional medication trials at this time, we could consider Sinemet CR which may give her less side effects but it is not a guarantee.  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  We talked about supportive treatments, maintaining a healthy lifestyle, trying to exercise to keep up strength, and fall prevention again today.  We will follow-up as needed, we could do a follow-up after she sees [place] neurology, she may decide to follow-up through [facility] neurology in the future as well.  I answered all their questions today and the patient and her husband were in agreement.   I spent 30 minutes in total face-to-face time and in reviewing records during pre-charting, more than 50% of which was spent in counseling and coordination of care, reviewing test results, reviewing medications and  treatment regimen and/or in discussing or reviewing the diagnosis of atypical parkinsonism, the prognosis and treatment options. Pertinent laboratory and  imaging test results that were available during this visit with the patient were reviewed by me and considered in my medical decision making (see chart for details).

## 2022-07-10 NOTE — Patient Instructions (Signed)
Medication Instructions:  Your physician recommends that you continue on your current medications as directed. Please refer to the Current Medication list given to you today.  *If you need a refill on your cardiac medications before your next appointment, please call your pharmacy*   Lab Work: none If you have labs (blood work) drawn today and your tests are completely normal, you will receive your results only by: [place] (if you have MyChart) OR A paper copy in the mail If you have any lab test that is abnormal or we need to change your treatment, we will call you to review the results.   Testing/Procedures: Your physician has recommended that you have a sleep study. This test records several body functions during sleep, including: brain activity, eye movement, oxygen and carbon dioxide blood levels, heart rate and rhythm, breathing rate and rhythm, the flow of air through your mouth and nose, snoring, body muscle movements, and chest and belly movement.    Follow-Up: At [facility], you and your health needs are our priority.  As part of our continuing mission to provide you with exceptional heart care, we have created designated Provider Care Teams.  These Care Teams include your primary Cardiologist (physician) and Advanced Practice Providers (APPs -  Physician Assistants and Nurse Practitioners) who all work together to provide you with the care you need, when you need it.  We recommend signing up for the patient portal called "MyChart".  Sign up information is provided on this After Visit Summary.  MyChart is used to connect with patients for Virtual Visits (Telemedicine).  Patients are able to view lab/test results, encounter notes, upcoming appointments, etc.  Non-urgent messages can be sent to your provider as well.   To learn more about what you can do with MyChart, go to [email or web address].    Your next appointment:   Based on test results  The format  for your next appointment:   In Person  Provider:   Dr Radford Pax    Other Instructions   Important Information About Sugar

## 2022-07-10 NOTE — Progress Notes (Signed)
Evaluation Performed:  Follow-up visit  Date:  07/10/2022   ID:  [name], [name] 05/18/1951, MRN [id number]   PCP:  [name], MD  Cardiologist: [name], MD Sleep Medicine:  Fransico Him, MD Electrophysiologist:  [name], MD   Chief Complaint:  OSA  History of Present Illness:    [name] [name] is a 71 y.o. female with a hx of chronic diastolic CHF, CAD and afib who was referred for a sleep study by Dr. Aundra [name] due to excessive daytime sleepiness.  She was found to have moderate OSA with an AHI of 22/hr with significant oxygen desaturations as low as 85%.  She underwent CPAP titration to 18cm H2O but did not tolerate the high pressure and was subsequently placed on auto Pap from 6 to 14 cm H2O and is now on BiPAP.  She has had problems with  restless legs and I started her on Requip 0.58m nightly.  Ferritin was normal.    She called in earlier this year stating that her PCP was concerned that she was having problems with increased respiratory events at night as high as 50-60/hr  and needed another sleep study.  She underwent repeat BiPAP titration and was titrated to 9/5cm H2O.    She is doing well with her PAP device and thinks that she has gotten used to it.  She tolerates the mask and feels the pressure is adequate.  Since going on PAP she feels rested in the am and has no significant daytime sleepiness.  She denies any significant mouth or nasal dryness or nasal congestion.  She does not think that he snores.     Prior CV studies:   The following studies were reviewed today:  PAP compliance download  Past Medical History:  Diagnosis Date   Anxiety    Aortic stenosis    a. bicuspid aortic valve; mean gradient of 20 mmHg in 2/10; b. s/p bioprosth AVR (19-mm Edwards pericardial valve) with a redo coronary artery bypass graft procedure in 07/2009; postoperative Dressler's syndrome     Arthritis    Breast cancer ([place]    Carcinoma, renal cell ([place] 05/2007    Laparoscopic right nephrectomy   Chronic diastolic CHF (congestive heart failure) ([place]    a. 9.2014 EchoP EF 60-65%, no rwma, bioprosth AVR, mean gradient of 165mg, mild to mod MR, PASP 5140m.   CKD (chronic kidney disease), stage IV (HCC)    Colitis, ischemic ([facility]   Coronary artery disease    a. initial CABG with SVG-RCA in 12/05. b. redo SVG-RCA and bioprosthetic AVR in 1/11. d. 9/14 - PCI with DES to distal LM/proximal LAD was done rather than bypass as she would have been a poor candidate for redo sternotomy. d. S/p DES to prox LM 05/2014.   Diabetes mellitus 03/2010    TYPE II: Hemoglobin A1c of 7.4 in  03/2010; 8.4 in 06/2010; treated with insulin pump   Duodenal ulcer    Remote; H. pylori positive   Gastroparesis due to DM ([place]CC[place]0/04/2012   Glaucoma 1998   Hodgkin's disease ([place]   M[place]le radiation therapy   Hyperlipidemia    Hyperplastic gastric polyp 06/23/2012   Hypertension    Iron deficiency anemia    a. 03/2013 EGD: essentially normal. Possible slow GIB.   Migraines    NASH (nonalcoholic steatohepatitis) 1999   -biopsy in 1999   OSA treated with BiPAP    Osteoporosis    Pacemaker    six sinus  syndrome   PAF (paroxysmal atrial fibrillation) (HCC)    a.  h/o brief episodes noted on ppm interrogation. b. given GI bleeding and need to be on both ASA 81 and Brilinta, risks likely outweigh benefits of anticoagulation.    Small bowel obstruction (HCC)    Recurrent; resolved after resection of a lipoma   Syncope    a. H/o recurrent syncope with pauses on loop recorder s/p Medtronic pacemaker 07/2012.   Past Surgical History:  Procedure Laterality Date   ABDOMINAL HYSTERECTOMY  2000   AORTIC VALVE REPLACEMENT  2011   Aortic valve replacement surgery with a 19 mm bioprosthetic device post redo CABG January 2011.   BONE MARROW BIOPSY     BREAST EXCISIONAL BIOPSY     benign, bilateral   CARDIAC CATHETERIZATION     CERVICAL BIOPSY     COLONOSCOPY      Multiple with adenomatous polyps   COLONOSCOPY  06/23/2012   Procedure: COLONOSCOPY;  Surgeon: Gatha Mayer, MD;  Location: [facility] ENDOSCOPY;  Service: Endoscopy;  Laterality: N/A;   CORONARY ARTERY BYPASS GRAFT  2005, 2011   CABG-most recent SVG to RCA-12/05; RCA occluded with patent graft in 2006; Redo bypass in 07/2009   ESOPHAGOGASTRODUODENOSCOPY     ESOPHAGOGASTRODUODENOSCOPY  06/23/2012   Procedure: ESOPHAGOGASTRODUODENOSCOPY (EGD);  Surgeon: Gatha Mayer, MD;  Location: [facility];  Service: Endoscopy;  Laterality: N/A;   ESOPHAGOGASTRODUODENOSCOPY N/A 04/06/2013   Procedure: ESOPHAGOGASTRODUODENOSCOPY (EGD);  Surgeon: Irene Shipper, MD;  Location: [facility] ENDOSCOPY;  Service: Endoscopy;  Laterality: N/A;   EXPLORATORY LAPAROTOMY W/ BOWEL RESECTION     Small bowel resection of lipoma   GIVENS CAPSULE STUDY N/A 04/30/2013   Procedure: GIVENS CAPSULE STUDY;  Surgeon: Gatha Mayer, MD;  Location: [place];  Service: Endoscopy;  Laterality: N/A;   LEFT AND RIGHT HEART CATHETERIZATION WITH CORONARY ANGIOGRAM N/A 04/07/2013   Procedure: LEFT AND RIGHT HEART CATHETERIZATION WITH CORONARY ANGIOGRAM;  Surgeon: Larey Dresser, MD;  Location[facility] CATH LAB;  Service: Cardiovascular;  Laterality: N/A;   LEFT ATRIAL APPENDAGE OCCLUSION N/A 02/16/2016   Watchman not placed - nickel allergy   LEFT HEART CATHETERIZATION WITH CORONARY/GRAFT ANGIOGRAM N/A 06/07/2014   Procedure: LEFT HEART CATHETERIZATION WITH Beatrix Fetters;  Surgeon: Burnell Blanks, MD;  Location: [facility] CATH LAB;  Service: Cardiovascular;  Laterality: N/A;   LIVER BIOPSY     LOOP RECORDER IMPLANT N/A 12/10/2011   Procedure: LOOP RECORDER IMPLANT;  Surgeon: Evans Lance, MD;  Location: [facility] CATH LAB;  Service: Cardiovascular;  Laterality: N/A;   MALONEY DILATION  06/23/2012   Procedure: [name] DILATION;  Surgeon: Gatha Mayer, MD;  Location: [facility] ENDOSCOPY;  Service: Endoscopy;;  51 fr   MASTECTOMY Left 05/17/2015    total    NEPHRECTOMY  2008   Laparoscopic right nephrectomy, renal cell carcinoma   PACEMAKER INSERTION  08/21/2012   Medtronic Adapta L dual-chamber pacemaker   PERCUTANEOUS CORONARY STENT INTERVENTION (PCI-S) N/A 04/09/2013   Procedure: PERCUTANEOUS CORONARY STENT INTERVENTION (PCI-S);  Surgeon: Burnell Blanks, MD;  Location: [facility] CATH LAB;  Service: Cardiovascular;  Laterality: N/A;   PERMANENT PACEMAKER INSERTION N/A 08/21/2012   Procedure: PERMANENT PACEMAKER INSERTION;  Surgeon: Evans Lance, MD;  Location: [facility] CATH LAB;  Service: Cardiovascular;  Laterality: N/A;   RIGHT HEART CATH N/A 03/18/2019   Procedure: RIGHT HEART CATH;  Surgeon: Larey Dresser, MD;  Location: [place] CV LAB;  Service: Cardiovascular;  Laterality: N/A;   RIGHT  HEART CATH AND CORONARY/GRAFT ANGIOGRAPHY N/A 04/19/2017   Procedure: RIGHT HEART CATH AND CORONARY/GRAFT ANGIOGRAPHY;  Surgeon: Larey Dresser, MD;  Location: [place] CV LAB;  Service: Cardiovascular;  Laterality: N/A;   TEE WITHOUT CARDIOVERSION N/A 12/07/2015   Procedure: TRANSESOPHAGEAL ECHOCARDIOGRAM (TEE);  Surgeon: Larey Dresser, MD;  Location: [place];  Service: Cardiovascular;  Laterality: N/A;   TOTAL ABDOMINAL HYSTERECTOMY W/ BILATERAL SALPINGOOPHORECTOMY  2000   endometriosis and ovarian cysts   TOTAL MASTECTOMY Left 05/17/2015   Procedure: LEFT TOTAL MASTECTOMY;  Surgeon: Rolm Bookbinder, MD;  Location: [place];  Service: General;  Laterality: Left;   TUMOR EXCISION  1981   removal of Hodgkins lymphoma     Current Meds  Medication Sig   acyclovir ointment (ZOVIRAX) 5 % Apply 1 application topically every 3 (three) hours as needed (fever blister/cold sores).   ALPRAZolam (XANAX) 0.5 MG tablet Take 1 tablet (0.5 mg total) by mouth 2 (two) times daily as needed for anxiety.   APIDRA 100 UNIT/ML injection Inject 40 Units into the skin as directed. INFUSE THROUGH INSULIN PUMP UTD, Bolus depends on carb intake   aspirin  EC 81 MG tablet Take 81 mg by mouth at bedtime.   bisacodyl (DULCOLAX) 5 MG EC tablet Take 1 tablet (5 mg total) by mouth daily as needed for moderate constipation.   BRILINTA 60 MG TABS tablet TAKE ONE TABLET (60MG TOTAL) BY MOUTH TWICE DAILY   cetirizine (ZYRTEC) 10 MG tablet Take 10 mg by mouth daily.   clobetasol (OLUX) 0.05 % topical foam Apply 1 application topically 2 (two) times daily as needed (skin flaking).   Coenzyme Q10 (CO Q 10) 100 MG CAPS Take 100 mg by mouth at bedtime.    colchicine 0.6 MG tablet Take 0.6 mg by mouth daily as needed (gout flare).   denosumab (PROLIA) 60 MG/ML SOSY injection Inject 60 mg into the skin every 6 (six) months.   diclofenac Sodium (VOLTAREN) 1 % GEL Apply 2 g topically 4 (four) times daily. As needed   escitalopram (LEXAPRO) 5 MG tablet Take 5 mg by mouth daily.   famotidine (PEPCID) 40 MG tablet Take 40 mg by mouth daily. Takes a second tablet at night if needed.   glucagon (GLUCAGON EMERGENCY) 1 MG injection Inject 1 mg into the vein once as needed (low blood sugar).   isosorbide mononitrate (IMDUR) 120 MG 24 hr tablet TAKE ONE TABLET (120MG TOTAL) BY MOUTH DAILY   latanoprost (XALATAN) 0.005 % ophthalmic solution Place 1 drop into both eyes at bedtime.   metolazone (ZAROXOLYN) 2.5 MG tablet Take 2.5 mg (1 tab) once as directed by CHF clinic. Call before taking [phone]   metoprolol succinate (TOPROL XL) 25 MG 24 hr tablet Take 1 tablet (25 mg total) by mouth daily.   mometasone (NASONEX) 50 MCG/ACT nasal spray Place 2 sprays into the nose daily as needed (allergy).   mupirocin cream (BACTROBAN) 2 % Apply 1 application topically 2 (two) times daily as needed.   nitroGLYCERIN (NITROLINGUAL) 0.4 MG/SPRAY spray PLACE 1 SPRAY UNDER THE TONGUE EVERY 5 MINUTES FOR 3 DOSES AS NEEDED FOR CHEST PAIN.   polyethylene glycol (MIRALAX / GLYCOLAX) 17 g packet Take 17 g by mouth as needed.   pravastatin (PRAVACHOL) 10 MG tablet TAKE ONE TABLET (10 MG TOTAL)  BY MOUTH DAILY.   Probiotic Product (ALIGN PO) Take 1 tablet by mouth daily in the afternoon.   REPATHA SURECLICK 476 MG/ML SOAJ INJECT 140MG INTO THE SKIN  EVERY 14 DAYS   rOPINIRole (REQUIP) 0.25 MG tablet Take 1 tablet (0.25 mg total) by mouth at bedtime. Please call to schedule an overdue appointment with Dr. Radford Pax for refills, [phone], thank you. 1st attempt   senna (SENOKOT) 8.6 MG tablet Take 1 tablet by mouth as needed for constipation.   silver nitrate applicators 80-88 % applicator Use as needed to stop superficial skin bleeding.   spironolactone (ALDACTONE) 25 MG tablet Take 0.5 tablets (12.5 mg total) by mouth at bedtime.   torsemide (DEMADEX) 20 MG tablet Take 2 tablets (40 mg total) by mouth daily.   ULORIC 40 MG tablet Take 1 tablet (40 mg total) by mouth at bedtime.   VASCEPA 1 g capsule TAKE TWO CAPSULES (2 GRAMS TOTAL) BY MOUTH TWO TIMES DAILY   Wheat Dextrin (BENEFIBER) POWD Take 1 Dose by mouth daily as needed.     Allergies:   Avandia [rosiglitazone maleate], Cephalexin, Clindamycin, Clindamycin hcl, Humalog [insulin lispro], Lincomycin, Metformin, Novolog [insulin aspart], Penicillins, Rosiglitazone, Tizanidine, Adhesive [tape], Atorvastatin, Cholestyramine, Gentamycin [gentamicin], Iodinated contrast media, Limonene, Prednisone, Ranexa [ranolazine], Rosuvastatin, Simvastatin, Azelastine, Clopidogrel, Kerendia [finerenone], Lisinopril, Oseltamivir phosphate, Tizanidine hcl, Codeine, Erythromycin, Hydrocodone-acetaminophen, Latex, Neomycin, Nickel, Ranitidine, Tamiflu [oseltamivir], and Tramadol   Social History   Tobacco Use   Smoking status: Never   Smokeless tobacco: Never  Vaping Use   Vaping Use: Never used  Substance Use Topics   Alcohol use: No    Comment: social, 39s   Drug use: Never     Family Hx: The patient's family history includes Anal fissures in her mother; Breast cancer (age of onset: 26) in her sister; Cancer (age of onset: 30) in her sister;  Cancer (age of onset: 30) in her maternal grandfather; Diabetes in her father; Glaucoma in her brother; Heart attack in her father, maternal grandmother, and mother; Heart disease in her father and mother; Hypertension in her brother; Lung cancer in her sister; Parkinson's disease in her paternal aunt. There is no history of Colon cancer, Stomach cancer, or Parkinsonism.  ROS:   Please see the history of present illness.     All other systems reviewed and are negative.   Labs/Other Tests and Data Reviewed:    Recent Labs: 04/20/2022: Magnesium 2.4 05/09/2022: ALT 23 07/04/2022: B Natriuretic Peptide 184.8; BUN 49; Creatinine, Ser 1.87; Hemoglobin 11.0; Platelets 128; Potassium 4.6; Sodium 140   Recent Lipid Panel Lab Results  Component Value Date/Time   CHOL 107 02/20/2022 12:39 PM   TRIG 176 (H) 02/20/2022 12:39 PM   HDL 33 (L) 02/20/2022 12:39 PM   CHOLHDL 3.2 02/20/2022 12:39 PM   LDLCALC 39 02/20/2022 12:39 PM   LDLDIRECT 43.0 12/14/2014 09:38 AM    Wt Readings from Last 3 Encounters:  07/10/22 111 lb (50.3 kg)  07/10/22 111 lb (50.3 kg)  07/04/22 113 lb 12.8 oz (51.6 kg)     Objective:    Vital Signs:  BP 120/71   Pulse 80   Ht _0  (1.6 m)   Wt 111 lb (50.3 kg)   SpO2 99%   BMI 19.66 kg/m   GEN: Well nourished, well developed in no acute distress HEENT: Normal NECK: No JVD; No carotid bruits LYMPHATICS: No lymphadenopathy CARDIAC:RRR, no murmurs, rubs, gallops RESPIRATORY:  Clear to auscultation without rales, wheezing or rhonchi  ABDOMEN: Soft, non-tender, non-distended MUSCULOSKELETAL:  No edema; No deformity  SKIN: Warm and dry NEUROLOGIC:  Alert and oriented x 3 PSYCHIATRIC:  Normal affect  ASSESSMENT & PLAN:  1. OSA - The patient is tolerating PAP therapy well without any problems. The PAP download performed by his DME was personally reviewed and interpreted by me today and showed an AHI of 62.7/hr on auto BiPAP 9/5 cm H2O with 97% compliance in  using more than 4 hours nightly.  The patient has been using and benefiting from PAP use and will continue to benefit from therapy.  -I am very puzzled as to why she was perfectly titrated on auto BiPAP a month ago to 9/5cm H2O and now is having over 60 events/hr.  ? Whether she is now having mainly central events -she uses a FFM that does not leak.  She mainly sleeps nonsupine.  -I think we need to start over and get an in lab PSG to determine if she has mainly central sleep apnea  2.  HTN -Controlled on exam today -continue prescription drug management with Toprol XL 8m daily, Imdur 1272mdaily and spiro 2581maily with PRN refills  3.  Restless Legs syndrome Continue prescription drug management with Requip   Medication Adjustments/Labs and Tests Ordered: Current medicines are reviewed at length with the patient today.  Concerns regarding medicines are outlined above.  Tests Ordered: No orders of the defined types were placed in this encounter.   Medication Changes: No orders of the defined types were placed in this encounter.    Disposition:  Follow up in 6 months   Signed, TraFransico HimD  07/10/2022 12:51 PM    [place]

## 2022-07-11 ENCOUNTER — Telehealth (HOSPITAL_COMMUNITY): Payer: Self-pay | Admitting: *Deleted

## 2022-07-11 NOTE — Telephone Encounter (Signed)
Authorization #[id number]   echo approved

## 2022-07-13 ENCOUNTER — Ambulatory Visit (HOSPITAL_COMMUNITY)
Admission: RE | Admit: 2022-07-13 | Discharge: 2022-07-13 | Disposition: A | Discharge status: Home / Self Care | Payer: Medicare PPO | Source: Ambulatory Visit | Attending: Internal Medicine | Admitting: Internal Medicine

## 2022-07-13 DIAGNOSIS — I5032 Chronic diastolic (congestive) heart failure: Secondary | ICD-10-CM | POA: Insufficient documentation

## 2022-07-13 LAB — BASIC METABOLIC PANEL
Anion gap: 9 (ref 5–15)
BUN: 73 mg/dL — ABNORMAL HIGH (ref 8–23)
CO2: 28 mmol/L (ref 22–32)
Calcium: 10.4 mg/dL — ABNORMAL HIGH (ref 8.9–10.3)
Chloride: 101 mmol/L (ref 98–111)
Creatinine, Ser: 2.78 mg/dL — ABNORMAL HIGH (ref 0.44–1.00)
GFR, Estimated: 18 mL/min — ABNORMAL LOW (ref >60)
Glucose, Bld: 222 mg/dL — ABNORMAL HIGH (ref 70–99)
Potassium: 4.3 mmol/L (ref 3.5–5.1)
Sodium: 138 mmol/L (ref 135–145)

## 2022-07-17 ENCOUNTER — Telehealth (HOSPITAL_COMMUNITY): Payer: Self-pay | Admitting: Surgery

## 2022-07-17 DIAGNOSIS — I5032 Chronic diastolic (congestive) heart failure: Secondary | ICD-10-CM

## 2022-07-17 DIAGNOSIS — E10649 Type 1 diabetes mellitus with hypoglycemia without coma: Secondary | ICD-10-CM | POA: Diagnosis not present

## 2022-07-17 MED ORDER — TORSEMIDE 20 MG PO TABS: ORAL_TABLET | ORAL | 3 refills | Status: DC

## 2022-07-17 NOTE — Telephone Encounter (Signed)
-----   Message from Larey Dresser, MD sent at 07/13/2022 12:06 PM EST ----- Creatinine up to 2.7.  Hold torsemide for 1 day then decrease to 20 mg daily alternating with 40 mg daily. BMET in 10 days.

## 2022-07-17 NOTE — Telephone Encounter (Signed)
Patient called and made aware of results and recommendations.  Order updated in [facility].Lab appt scheduled.

## 2022-07-24 ENCOUNTER — Other Ambulatory Visit (HOSPITAL_COMMUNITY): Payer: Medicare PPO

## 2022-07-24 ENCOUNTER — Ambulatory Visit (HOSPITAL_COMMUNITY): Payer: Medicare PPO

## 2022-07-25 ENCOUNTER — Telehealth: Payer: Self-pay | Admitting: *Deleted

## 2022-07-25 DIAGNOSIS — G4733 Obstructive sleep apnea (adult) (pediatric): Secondary | ICD-10-CM

## 2022-07-25 DIAGNOSIS — G2581 Restless legs syndrome: Secondary | ICD-10-CM

## 2022-07-25 DIAGNOSIS — I10 Essential (primary) hypertension: Secondary | ICD-10-CM

## 2022-07-26 DIAGNOSIS — K449 Diaphragmatic hernia without obstruction or gangrene: Secondary | ICD-10-CM | POA: Diagnosis not present

## 2022-07-26 DIAGNOSIS — Z7982 Long term (current) use of aspirin: Secondary | ICD-10-CM | POA: Diagnosis not present

## 2022-07-26 DIAGNOSIS — Z794 Long term (current) use of insulin: Secondary | ICD-10-CM | POA: Diagnosis not present

## 2022-07-26 DIAGNOSIS — K766 Portal hypertension: Secondary | ICD-10-CM | POA: Diagnosis not present

## 2022-07-26 DIAGNOSIS — K317 Polyp of stomach and duodenum: Secondary | ICD-10-CM | POA: Diagnosis not present

## 2022-07-26 DIAGNOSIS — Z7984 Long term (current) use of oral hypoglycemic drugs: Secondary | ICD-10-CM | POA: Diagnosis not present

## 2022-07-26 DIAGNOSIS — R1310 Dysphagia, unspecified: Secondary | ICD-10-CM | POA: Diagnosis not present

## 2022-07-26 DIAGNOSIS — Z79899 Other long term (current) drug therapy: Secondary | ICD-10-CM | POA: Diagnosis not present

## 2022-07-30 ENCOUNTER — Ambulatory Visit (HOSPITAL_COMMUNITY)
Admission: RE | Admit: 2022-07-30 | Discharge: 2022-07-30 | Disposition: A | Discharge status: Home / Self Care | Payer: Medicare PPO | Source: Ambulatory Visit | Attending: Cardiology | Admitting: Cardiology

## 2022-07-30 DIAGNOSIS — I5032 Chronic diastolic (congestive) heart failure: Secondary | ICD-10-CM | POA: Diagnosis not present

## 2022-07-30 LAB — BASIC METABOLIC PANEL
Anion gap: 9 (ref 5–15)
BUN: 47 mg/dL — ABNORMAL HIGH (ref 8–23)
CO2: 26 mmol/L (ref 22–32)
Calcium: 10.3 mg/dL (ref 8.9–10.3)
Chloride: 103 mmol/L (ref 98–111)
Creatinine, Ser: 2.27 mg/dL — ABNORMAL HIGH (ref 0.44–1.00)
GFR, Estimated: 23 mL/min — ABNORMAL LOW (ref >60)
Glucose, Bld: 184 mg/dL — ABNORMAL HIGH (ref 70–99)
Potassium: 4.7 mmol/L (ref 3.5–5.1)
Sodium: 138 mmol/L (ref 135–145)

## 2022-08-01 ENCOUNTER — Other Ambulatory Visit (HOSPITAL_COMMUNITY): Payer: Self-pay

## 2022-08-02 ENCOUNTER — Other Ambulatory Visit: Payer: Self-pay | Admitting: Family Medicine

## 2022-08-02 DIAGNOSIS — G4733 Obstructive sleep apnea (adult) (pediatric): Secondary | ICD-10-CM | POA: Diagnosis not present

## 2022-08-02 DIAGNOSIS — R9389 Abnormal findings on diagnostic imaging of other specified body structures: Secondary | ICD-10-CM

## 2022-08-02 DIAGNOSIS — I4891 Unspecified atrial fibrillation: Secondary | ICD-10-CM | POA: Diagnosis not present

## 2022-08-02 DIAGNOSIS — E1122 Type 2 diabetes mellitus with diabetic chronic kidney disease: Secondary | ICD-10-CM | POA: Diagnosis not present

## 2022-08-02 DIAGNOSIS — I2510 Atherosclerotic heart disease of native coronary artery without angina pectoris: Secondary | ICD-10-CM | POA: Diagnosis not present

## 2022-08-02 DIAGNOSIS — E785 Hyperlipidemia, unspecified: Secondary | ICD-10-CM | POA: Diagnosis not present

## 2022-08-02 DIAGNOSIS — N184 Chronic kidney disease, stage 4 (severe): Secondary | ICD-10-CM | POA: Diagnosis not present

## 2022-08-02 DIAGNOSIS — I6523 Occlusion and stenosis of bilateral carotid arteries: Secondary | ICD-10-CM | POA: Diagnosis not present

## 2022-08-02 DIAGNOSIS — K7581 Nonalcoholic steatohepatitis (NASH): Secondary | ICD-10-CM | POA: Diagnosis not present

## 2022-08-02 DIAGNOSIS — K7460 Unspecified cirrhosis of liver: Secondary | ICD-10-CM | POA: Diagnosis not present

## 2022-08-02 NOTE — Telephone Encounter (Signed)
Prior Authorization for NPSG sent to [facility] via web portal. Tracking Number .  1/11 READY -APPROVED-AUTH#[id number]-VALID DATES-08/11/22--11/09/22. 1/9  CASE REQUIRES CLINICAL REVIEW

## 2022-08-02 NOTE — Progress Notes (Signed)
Remote pacemaker transmission.   

## 2022-08-03 DIAGNOSIS — E10649 Type 1 diabetes mellitus with hypoglycemia without coma: Secondary | ICD-10-CM | POA: Diagnosis not present

## 2022-08-06 DIAGNOSIS — N6001 Solitary cyst of right breast: Secondary | ICD-10-CM | POA: Diagnosis not present

## 2022-08-06 DIAGNOSIS — R928 Other abnormal and inconclusive findings on diagnostic imaging of breast: Secondary | ICD-10-CM | POA: Diagnosis not present

## 2022-08-09 ENCOUNTER — Ambulatory Visit (HOSPITAL_BASED_OUTPATIENT_CLINIC_OR_DEPARTMENT_OTHER): Payer: Medicare PPO | Admitting: Cardiology

## 2022-08-09 ENCOUNTER — Ambulatory Visit (HOSPITAL_COMMUNITY)
Admission: RE | Admit: 2022-08-09 | Discharge: 2022-08-09 | Disposition: A | Discharge status: Home / Self Care | Payer: Medicare PPO | Source: Ambulatory Visit | Attending: Family Medicine | Admitting: Family Medicine

## 2022-08-09 ENCOUNTER — Encounter: Payer: Self-pay | Admitting: Family Medicine

## 2022-08-09 VITALS — BP 130/80 | HR 76 | Wt 113.0 lb

## 2022-08-09 VITALS — Ht 62.0 in | Wt 110.0 lb

## 2022-08-09 DIAGNOSIS — Z950 Presence of cardiac pacemaker: Secondary | ICD-10-CM | POA: Insufficient documentation

## 2022-08-09 DIAGNOSIS — I130 Hypertensive heart and chronic kidney disease with heart failure and stage 1 through stage 4 chronic kidney disease, or unspecified chronic kidney disease: Secondary | ICD-10-CM | POA: Diagnosis not present

## 2022-08-09 DIAGNOSIS — I480 Paroxysmal atrial fibrillation: Secondary | ICD-10-CM | POA: Insufficient documentation

## 2022-08-09 DIAGNOSIS — G4733 Obstructive sleep apnea (adult) (pediatric): Secondary | ICD-10-CM

## 2022-08-09 DIAGNOSIS — Q231 Congenital insufficiency of aortic valve: Secondary | ICD-10-CM | POA: Diagnosis not present

## 2022-08-09 DIAGNOSIS — E785 Hyperlipidemia, unspecified: Secondary | ICD-10-CM | POA: Diagnosis not present

## 2022-08-09 DIAGNOSIS — Z9641 Presence of insulin pump (external) (internal): Secondary | ICD-10-CM | POA: Insufficient documentation

## 2022-08-09 DIAGNOSIS — E1022 Type 1 diabetes mellitus with diabetic chronic kidney disease: Secondary | ICD-10-CM | POA: Insufficient documentation

## 2022-08-09 DIAGNOSIS — Z905 Acquired absence of kidney: Secondary | ICD-10-CM | POA: Insufficient documentation

## 2022-08-09 DIAGNOSIS — Z951 Presence of aortocoronary bypass graft: Secondary | ICD-10-CM | POA: Diagnosis not present

## 2022-08-09 DIAGNOSIS — G2581 Restless legs syndrome: Secondary | ICD-10-CM | POA: Insufficient documentation

## 2022-08-09 DIAGNOSIS — Z91041 Radiographic dye allergy status: Secondary | ICD-10-CM | POA: Insufficient documentation

## 2022-08-09 DIAGNOSIS — Z7902 Long term (current) use of antithrombotics/antiplatelets: Secondary | ICD-10-CM | POA: Insufficient documentation

## 2022-08-09 DIAGNOSIS — Z888 Allergy status to other drugs, medicaments and biological substances status: Secondary | ICD-10-CM | POA: Insufficient documentation

## 2022-08-09 DIAGNOSIS — N184 Chronic kidney disease, stage 4 (severe): Secondary | ICD-10-CM | POA: Insufficient documentation

## 2022-08-09 DIAGNOSIS — D631 Anemia in chronic kidney disease: Secondary | ICD-10-CM | POA: Insufficient documentation

## 2022-08-09 DIAGNOSIS — Z953 Presence of xenogenic heart valve: Secondary | ICD-10-CM | POA: Insufficient documentation

## 2022-08-09 DIAGNOSIS — I495 Sick sinus syndrome: Secondary | ICD-10-CM | POA: Diagnosis not present

## 2022-08-09 DIAGNOSIS — Z923 Personal history of irradiation: Secondary | ICD-10-CM | POA: Diagnosis not present

## 2022-08-09 DIAGNOSIS — K7460 Unspecified cirrhosis of liver: Secondary | ICD-10-CM | POA: Insufficient documentation

## 2022-08-09 DIAGNOSIS — Z794 Long term (current) use of insulin: Secondary | ICD-10-CM | POA: Insufficient documentation

## 2022-08-09 DIAGNOSIS — Z79899 Other long term (current) drug therapy: Secondary | ICD-10-CM | POA: Insufficient documentation

## 2022-08-09 DIAGNOSIS — I5032 Chronic diastolic (congestive) heart failure: Secondary | ICD-10-CM | POA: Diagnosis not present

## 2022-08-09 DIAGNOSIS — I25110 Atherosclerotic heart disease of native coronary artery with unstable angina pectoris: Secondary | ICD-10-CM | POA: Diagnosis not present

## 2022-08-09 DIAGNOSIS — D472 Monoclonal gammopathy: Secondary | ICD-10-CM | POA: Insufficient documentation

## 2022-08-09 DIAGNOSIS — I10 Essential (primary) hypertension: Secondary | ICD-10-CM | POA: Insufficient documentation

## 2022-08-09 LAB — BASIC METABOLIC PANEL
Anion gap: 9 (ref 5–15)
BUN: 64 mg/dL — ABNORMAL HIGH (ref 8–23)
CO2: 26 mmol/L (ref 22–32)
Calcium: 9.5 mg/dL (ref 8.9–10.3)
Chloride: 103 mmol/L (ref 98–111)
Creatinine, Ser: 2.44 mg/dL — ABNORMAL HIGH (ref 0.44–1.00)
GFR, Estimated: 21 mL/min — ABNORMAL LOW (ref >60)
Glucose, Bld: 125 mg/dL — ABNORMAL HIGH (ref 70–99)
Potassium: 3.9 mmol/L (ref 3.5–5.1)
Sodium: 138 mmol/L (ref 135–145)

## 2022-08-09 LAB — BRAIN NATRIURETIC PEPTIDE: B Natriuretic Peptide: 92.4 pg/mL (ref 0.0–100.0)

## 2022-08-09 MED ORDER — TORSEMIDE 20 MG PO TABS: 40.0000 mg | ORAL_TABLET | Freq: Every day | ORAL | 3 refills | Status: DC

## 2022-08-09 NOTE — Progress Notes (Signed)
ReDS Vest / Clip - 08/09/22 1143       ReDS Vest / Clip   Station Marker A    Ruler Value 28    ReDS Value Range High volume overload    ReDS Actual Value 42    Anatomical Comments sitting

## 2022-08-09 NOTE — Progress Notes (Signed)
Date:  08/09/2022   ID:  [name] [name], DOB 1951-05-29, MRN [id number]   Provider location: [place] Type of Visit: Established patient  PCP:  [name], MD  Cardiologist:  None Primary HF: Dr. Aundra Dubin   History of Present Illness: [name] [name] is a 72 y.o. female who has a history of CAD s/p CABG in 12/05 and redo in 1/11, bioprosthetic AVR, diastolic CHF, and sick sinus syndrome s/p PPM presents for cardiology followup.  She had initial CABG with SVG-RCA in 12/05 followed by redo SVG-RCA and bioprosthetic AVR in 1/11.  She has had diastolic CHF and is on Lasix.  In 9/14, she had a CPX test.  This showed severe functional limitation with ischemic ECG changes (inferolateral ST depression and ST elevation in V1 and V2). She had chest pain.  She had R/LHC in 9/14.  This showed elevated left and right heart filling pressures, patent SVG-PDA, and 80% distal LM/80% ostial LAD stenosis.  She was not a candidate for redo CABG (had 2 prior sternotomies as well as chest radiation for Hodgkins lymphoma).  She had DES from left main into proximal LAD and will need long-term DAPT => Brilinta was used as she has been a poor responder to Plavix in the past.  She re-developed angina in 11/15 and was admitted with unstable angina.  LHC showed 80% ostial LM and patent SVG-RCA. She had DES to ostial LM. Echo in 1/18 showed EF 60-65%, normal bioprosthetic aortic valve, mild-moderate MR, PASP 35 mmHg.    She has chronic iron deficiency anemia thought to be due to a slow GI bleed.  Last EGD was unremarkable and a capsule endoscopy was also unremarkable.  She has had periodic transfusions.     She had bilateral mastectomy for DCIS in 54/27 without complication.   She has an insulin pump followed by an endocrinologist at [facility].    Given myalgias with statins, she was started on Praluent.  LDL is now excellent.     In the past she has had short runs of atrial fibrillation.   She is not anticoagulated. Planned for Watchman placement, but given her nickel allergy, we were unable to place a Watchman.      Diagnosed with OSA, now on CPAP.    Given ongoing problems with exertional dyspnea and occasional chest pain, Cardiolite was repeated in 9/18 and showed inferior changes.  She underwent left/right heart cath in 9/18.  This showed elevated filling pressures, R>L.  There was 75+% ostial LCx stenosis.  However, there were 2 layers of stent from the mid left main into the ostial LAD, and LCx intervention was thought to be very risky.  It was decided to manage her medically.  Echo was repeated in 10/18 showing EF 55-60%, moderate to severe TR.     Given recurrent chest pain, she had ETT-Cardiolite done in 3/19.  This was a normal study with no evidence for ischemia or infarction.  Echo in 1/20 showed EF 55-60%, mild LVH, stable bioprosthetic aortic valve, moderate-severe TR.    Due to increased dyspnea and worsening renal function, she was set up for echo and RHC.  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  [place] in 8/20 showed primarily right-sided HF.  Cardiolite in 8/20  showed no ischemia or infarction.  Torsemide was increased and she cut back on sodium in her diet.    CPX in 11/20 showed severe functional limitation from HF.   She saw Dr. Mosetta Pigeon at [facility] for evaluation for heart/kidney transplant (for restrictive cardiomyopathy).  She was determined to be a poor surgical candidate due to extensive vascular calcifications.   CT chest in 8/21 showed a loculated right pleural effusion but unable to find enough fluid by ultrasound for thoracentesis.   Echo in 9/21 showed EF 60-65% with mild LVH, mild RV dilation with mildly decreased systolic  function and a D-shaped septum, PASP 40, normal bioprosthetic aortic valve, rheumatic-appearing MV with mild-moderate mitral stenosis (mean gradient 9 but MVA by PHT 2.7 cm^2), trivial MR.    Echo 2/23, EF 60-65%, mild LVH, normal RV systolic function but with D-shaped septum, normal bioprosthetic aortic valve, mild mitral stenosis (rheumatic-appearing valve, mean gradient 4 mmHg), mild MR.   Seen by hematology 10/23. Labs showed mild hypercalcemia. Calcium level 10.4 with albumin 4.1. SPEP showed M spike.  Kappa/lambda light chain ratio 2.17. Immunofixation showed polyclonal gammopathy. Plan is to return in 6 months to repeat myeloma labs. She has not had a cMRI due to PPM.   Last OV, 12/23, she endorsed worsening HF symptoms w/ worsening exertional fatigued and dyspnea, in the absence of marked fluid overload. ReDs Clip only 36% but torsemide was increased to 40 mg daily, but dose ultimately reduced back down to aleternating 20-40 mg daily due to bump in SCr. Repeat echo was ordered by Dr. Aundra Dubin but this was actually done at [facility] 12/23. Pt reports study was required by [facility] prior to undergoing an EGD to r/o esophageal varices, as she also has diagnosis of liver cirrhosis secondary to NASH. Per report in Care Everywhere, LVEF 55%, AoV prosthesis functioning normal. RV systolic function reported to be normal. Moderate TR noted.   Returns today for 1 month f/u. Continues to endorse fatigue and abdominal fullness. Wt however fairly stable w/o significant gain. No resting dyspnea. No LEE. Says she had f/u w/ nephrology recently and was told renal fx was stable and agreed w/ reduced dose of torsemide.   ReDs elevated at 42%, though does not appear significantly fluid overloaded on exam. BNP pending    EKG not performed   Unable to obtain MDT device interrogation today, transmitter out of service     Labs (2/19): hgb 13.2 Labs @ [facility] (3/19): K 4.7, creatinine 1.4, ANA negative, RF negative Labs  (4/19): K 3.9, creatinine 1.52 => 1.63 Labs (6/19): LDL 8, HDL 42, TGs 259 Labs (9/19): K 4.9, creatinine 1.65, hgb 13.4, plts 129, LDL 46,TGs 182 Labs (12/19): K 4.9, creatinine 2.07, hgb 12.4 Labs (3/20): K 4, creatinine 1.8 Labs (6/20): K 4.7, creatinine 1.47 Labs (8/20): creatinine 2.13 => 2.46 => 2.4, LDL 22 Labs (9/20): K 4.8, creatinine 2.74, AST/ALT normal, hgb 12.6 Labs (1/21): K 4.1, creatinine 1.86, hgb 11.7 Labs (4/21): K 4.7, creatinine 1.7 Labs (6/21): K 4.2, creatinine 2 => 1.7 Labs (8/21): K 3.8, creatinine 1.69, hgb 10.6, plts 116 Labs (9/21): LDL 34, TGs 209 Labs (1/22): K 3.8, creatinine 2.0 Labs (3/22): LDL 25 Labs (4/22): K 4.7, creatinine 2.0, hgb 12.3 Labs (5/22): K 4.4, creatinine 2.21 Labs (8/22): LDL 1, TGs 330 Labs (10/22): K 4.6, creatinine 1.87 Labs (11/22): LDL 26 Labs (12/22): K 4.1, creatinine 2.25 Labs (1/23): creatinine 2.65 Labs (3/23): K 4.6, creatinine 2.8 Labs (4/23): K 4.1, creatinine 2.16,  BNP 94 Labs (10/23): K 4.2, creatinine 1.62, calcium 10.4  Labs (12/23): K 4.1, creatinine 2.23   PMH: 1. Sick sinus syndrome s/p [employer] PCM.  2. HTN 3. H/o SBO 4. Renal cell carcinoma s/p right nephrectomy in 2008.  5. 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  Fibroscan in 11/14 showed advanced fibrosis concerning for cirrhosis. EGD in 11/14 showed no varices.  6. Diastolic CHF: Echo (9/56) with EF 60-65%, bioprosthetic aortic valve with mean gradient 15 mmHg, mild MR.  CPX (9/14): peak VO2 10.4, VE/VCO2 43.8, inferolateral ST depression and ST elevation in V1/V2 with severe dyspnea and chest pain, severe functional limitation with ischemic changes.  Echo (9/14) with EF 60-65%, normal RV size and systolic function, mild-moderate MR, bioprosthetic aortic valve normal, PA systolic pressure 51 mmHg, no evidence for pericardial constriction though exam incomplete for constriction.  Echo (11/15) with EF 55-60%, bioprosthetic aortic valve, mild-moderate MR,  moderate TR, PA systolic pressure 42 mmHg.  - TEE (5/17) with EF 60-65%, normal bioprosthetic aortic valve with mean gradient 13 mmHg, normal RV size and systolic function. - Echo (1/18) with EF 60-65%, normal bioprosthetic aortic valve, mild-moderate MR, PASP 35 mmHg.  - Echo (10/18) with EF 55-60%, bioprosthetic aortic valve mean gradient 10 mmHg, MAC with mild mitral stenosis (mean gradient 7) and mild mitral regurgitation, moderate to severe TR, PASP 43 mmHg.  - RHC (9/18): mean RA 13, PA 44/18 mean 33, mean PCWP 19, CI 3.45, PVR 2.7 WU.  - Echo (1/20): EF 55-60%, mild LVH, mild MR, stable bioprosthetic aortic valve, moderate-severe TR.  - RHC (8/20): mean RA 14, PA 43/15 mean 28, mean PCWP 13, PAPI 2, PVR 4.2, CI 2.31 - Echo (8/20): EF 55-60%, RV mildly dilated with normal systolic function, mildly D-shaped septum suggestive of RV pressure/volume overload, rheumatic-appearing mitral valve with mild mitral stenosis (mean gradient 6, MVA by PHT 4 cm^2), no evidence for pericardial constriction, moderate TR, bioprosthetic aortic valve functioning normally with mean gradient 6 mmHg.  - CPX (11/20): peak VO2 9.8, VE/VCO2 slope 38, RER 1.11 => severe functional limitation from HF.   - Echo (9/21): EF 60-65% with mild LVH, mild RV dilation with mildly decreased systolic function and a D-shaped septum, PASP 40, normal bioprosthetic aortic valve, rheumatic-appearing MV with mild-moderate mitral stenosis (mean gradient 9 but MVA by PHT 2.7 cm^2, trivial MR.  - Echo (2/23): EF 60-65%, mild LVH, normal RV systolic function but with D-shaped septum, normal bioprosthetic aortic valve, mild mitral stenosis (rheumatic-appearing valve, mean gradient 4 mmHg), mild MR.  7. TAH-BSO 8. Type I diabetes: Has insulin pump.  9. H/o ischemic colitis. 10. H/o PUD. 11. Diabetic gastroparesis. 12. Hyperlipidemia: Myalgias with Crestor and atorvastatin, elevated LFTs with simvastatin.  Myalgias with > 10 mg pravastatin. Now  on Praluent.  13. CKD stage IV: Sees Dr. Lorrene Reid 14. Bicuspid aortic valve s/p 19 mm Edwards pericardial valve in 1/11 (had post-operative Dresslers syndrome).   15. CAD: CABG with SVG-RCA in 12/05.  Redo SVG-RCA in 1/11 with AVR.  LHC/RHC (9/14) with mean RA 18, PA 62/25 mean 43, mean PCWP 26, CI 2.8, 70-80% distal LM stenosis, 80% ostial LAD stenosis, total occlusion RCA, SVG-PDA patent.  Patient had DES LM into LAD.  Plan to continue long-term ASA/Brilinta (poor responder to Plavix).  ETT-cardiolite (4/15) with 6' exercise, EF 60%, ST depression in recovery and mild chest pain, no ischemia or infarction by perfusion images.  Unstable angina 11/15 with 80% LM, patent SVG-RCA => DES to  ostial LM.   - Lexiscan Cardiolite (2/18): EF 62%, no ischemia or infarction (normal).  She had a significant reaction to [place] and probably should not get again.  - Cardiolite (9/18): EF 72%, prior inferior infarct with mild peri-infarct ischemia.  - LHC (9/18): 2 layers stent left main - ostial LAD with 50% dLM in-stent restenosis, 75+% ostial LCx, totally occluded RCA, SVG-RCA patent.  Medical management planned.  - ETT-Cardiolite (4/19): EF normal, 7'11" exercise, no ischemia or infarction.  - Lexiscan Cardiolite (8/20): No ischemia/infarction.  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  17. Anemia: Iron-deficiency.  Suspect chronic GI blood loss.  EGD in 9/14 was unremarkable.  Capsule endoscopy 10/14 was unremarkable.  Has required periodic blood transfusions.  18. Atrial fibrillation: Paroxysmal, noted by [facility] interrogation.  - Zio monitor 3/23: < 1% atrial fibrillation, longest run was 18 minutes.  19. Carotid stenosis: Carotid dopplers (2/15) with 40-59% bilateral stenosis. Carotid dopplers (3/16) with 40-59% bilateral ICA stenosis. Carotid dopplers (3/17) with 40-59% bilateral ICA stenosis.  - Carotid dopplers (3/18) with mild BICA stenosis.  20. Right leg pain:  Suspected focal dissection right CFA/EIA on 3/16 CTA, probably catheterization complication.  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  22. C difficile colitis 2017 23. Gout 24. ?TIA: head CT negative.  25. OSA: Moderate, uses CPAP.  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 Hypercalcemia 28. 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  Mild-moderate on 9/21 echo.  Mild on 2/23 echo.  29. Tricuspid regurgitation: Moderate by 8/20 echo.  30. Lung nodules: Followed at [facility] => stable.  31. ?Parkinsonism: She did not tolerate Sinemet.   Current Outpatient Medications  Medication Sig Dispense Refill   acyclovir ointment (ZOVIRAX) 5 % Apply 1 application topically every 3 (three) hours as needed (fever blister/cold sores).     ALPRAZolam (XANAX) 0.5 MG tablet Take 1 tablet (0.5 mg total) by mouth 2 (two) times daily as needed for anxiety. 60 tablet 1   APIDRA 100 UNIT/ML injection Inject 40 Units into the skin as directed. INFUSE THROUGH INSULIN PUMP UTD, Bolus depends on carb intake  2   aspirin EC 81 MG tablet Take 81 mg by mouth at bedtime.     bisacodyl (DULCOLAX) 5 MG EC tablet Take 1 tablet (5 mg total) by mouth daily as needed for moderate constipation. 30 tablet 0   BRILINTA 60 MG TABS tablet TAKE ONE TABLET (60MG TOTAL) BY MOUTH TWICE DAILY 180 tablet 3   cetirizine (ZYRTEC) 10 MG tablet Take 10 mg by mouth daily.     clobetasol (OLUX) 0.05 % topical foam Apply 1 application topically 2 (two) times daily as needed (skin flaking).     Coenzyme Q10 (CO Q 10) 100 MG CAPS Take 100 mg by mouth at bedtime.      colchicine 0.6 MG tablet Take 0.6 mg by mouth daily as needed (gout flare).     denosumab (PROLIA) 60 MG/ML SOSY injection Inject 60 mg into the skin every 6 (six) months.     diclofenac Sodium (VOLTAREN) 1 % GEL Apply 2 g topically 4 (four) times daily. As needed     escitalopram (LEXAPRO) 5 MG tablet Take 5 mg by mouth daily.     famotidine (PEPCID) 40 MG  tablet Take 40 mg by mouth daily. Takes a second tablet at night if needed.     glucagon (GLUCAGON EMERGENCY) 1 MG injection Inject 1 mg into the vein once as  needed (low blood sugar).     isosorbide mononitrate (IMDUR) 120 MG 24 hr tablet TAKE ONE TABLET (120MG TOTAL) BY MOUTH DAILY 90 tablet 3   latanoprost (XALATAN) 0.005 % ophthalmic solution Place 1 drop into both eyes at bedtime.     metolazone (ZAROXOLYN) 2.5 MG tablet Take 2.5 mg (1 tab) once as directed by CHF clinic. Call before taking [phone] 3 tablet 0   metoprolol succinate (TOPROL XL) 25 MG 24 hr tablet Take 1 tablet (25 mg total) by mouth daily. 90 tablet 3   mometasone (NASONEX) 50 MCG/ACT nasal spray Place 2 sprays into the nose daily as needed (allergy).     mupirocin cream (BACTROBAN) 2 % Apply 1 application topically 2 (two) times daily as needed. 90 g 0   nitroGLYCERIN (NITROLINGUAL) 0.4 MG/SPRAY spray PLACE 1 SPRAY UNDER THE TONGUE EVERY 5 MINUTES FOR 3 DOSES AS NEEDED FOR CHEST PAIN. 14.7 g 0   polyethylene glycol (MIRALAX / GLYCOLAX) 17 g packet Take 17 g by mouth as needed.     pravastatin (PRAVACHOL) 10 MG tablet TAKE ONE TABLET (10 MG TOTAL) BY MOUTH DAILY. 90 tablet 3   Probiotic Product (ALIGN PO) Take 1 tablet by mouth daily in the afternoon.     REPATHA SURECLICK 003 MG/ML SOAJ INJECT 140MG INTO THE SKIN EVERY 14 DAYS 6 mL 3   rOPINIRole (REQUIP) 0.25 MG tablet Take 1 tablet (0.25 mg total) by mouth at bedtime. Please call to schedule an overdue appointment with Dr. Radford Pax for refills, [phone], thank you. 1st attempt 30 tablet 0   senna (SENOKOT) 8.6 MG tablet Take 1 tablet by mouth as needed for constipation.     silver nitrate applicators 45-03 % applicator Use as needed to stop superficial skin bleeding. 10 each 0   spironolactone (ALDACTONE) 25 MG tablet Take 0.5 tablets (12.5 mg total) by mouth at bedtime. 45 tablet 0   torsemide (DEMADEX) 20 MG tablet Take 2 tablets (40 mg total) by mouth every other  day AND 1 tablet (20 mg total) every other day. 180 tablet 3   ULORIC 40 MG tablet Take 1 tablet (40 mg total) by mouth at bedtime. 90 tablet 3   VASCEPA 1 g capsule TAKE TWO CAPSULES (2 GRAMS TOTAL) BY MOUTH TWO TIMES DAILY 360 capsule 3   Wheat Dextrin (BENEFIBER) POWD Take 1 Dose by mouth daily as needed.     No current facility-administered medications for this encounter.    Allergies:   Avandia [rosiglitazone maleate], Cephalexin, Clindamycin, Clindamycin hcl, Humalog [insulin lispro], Lincomycin, Metformin, Novolog [insulin aspart], Penicillins, Rosiglitazone, Tizanidine, Adhesive [tape], Atorvastatin, Cholestyramine, Gentamycin [gentamicin], Iodinated contrast media, Limonene, Prednisone, Ranexa [ranolazine], Rosuvastatin, Simvastatin, Azelastine, Clopidogrel, Kerendia [finerenone], Lisinopril, Oseltamivir phosphate, Tizanidine hcl, Codeine, Erythromycin, Hydrocodone-acetaminophen, Latex, Neomycin, Nickel, Ranitidine, Tamiflu [oseltamivir], and Tramadol   Social History:  The patient  reports that she has never smoked. She has never used smokeless tobacco. She reports that she does not drink alcohol and does not use drugs.   Family History:  The patient's family history includes Anal fissures in her mother; Breast cancer (age of onset: 67) in her sister; Cancer (age of onset: 61) in her sister; Cancer (age of onset: 50) in her maternal grandfather; Diabetes in her father; Glaucoma in her brother; Heart attack in her father, maternal grandmother, and mother; Heart disease in her father and mother; Hypertension in her brother; Lung cancer in her sister; Parkinson's disease in her paternal aunt.   ROS:  Please see  the history of present illness.   All other systems are personally reviewed and negative.   Exam:   BP 130/80   Pulse 76   Wt 51.3 kg (113 lb)   SpO2 100%   BMI 20.02 kg/m  PHYSICAL EXAM: General:  chronically ill appearing. No respiratory difficulty HEENT: normal Neck:  supple. JVD 8 cm. Carotids 2+ bilat; no bruits. No lymphadenopathy or thyromegaly appreciated. Cor: PMI nondisplaced. Regular rate & rhythm. No rubs, gallops or murmurs. Lungs: clear Abdomen: soft, nontender, mildly distended. No hepatosplenomegaly. No bruits or masses. Good bowel sounds. Extremities: no cyanosis, clubbing, rash, edema Neuro: alert & oriented x 3, cranial nerves grossly intact. moves all 4 extremities w/o difficulty. Affect pleasant.   Wt Readings from Last 3 Encounters:  08/09/22 51.3 kg (113 lb)  07/10/22 50.3 kg (111 lb)  07/10/22 50.3 kg (111 lb)    ASSESSMENT AND PLAN:  1. CAD: s/p SVG-RCA in 12/05, then redo SVG-RCA in 1/11--> distal left main/proximal LAD severe stenosis in 9/14.  PCI with DES to distal LM/proximal LAD was done rather than bypass as she would have been a poor candidate for redo sternotomy (2 prior sternotomies and history of chest wall radiation for Hodgkins disease).  Recurrent unstable angina with 80% ostial LM treated with repeat DES in 11/15.  Lexiscan Cardiolite in 9/18 inferior changes.  Repeat LHC (9/18) showed 50% distal left main in-stent restenosis and 75+% ostial LCx stenosis. I reviewed the films at the time with interventional cardiology => would be very difficult to intervene on the ostial LCx through 2 overlapping stents from left main into the LAD.  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 Given no ACS, we planned medical management and increased Imdur.  Cardiolite in 3/19 and again in 8/20 showed no ischemia or infarction.  She has had some chest pain with heavy exertion, this seems chronic.  - Continue Brilinta 60 mg bid (poor responder to Plavix) long-term given recurrent left main disease.  - Continue ASA 81, pravastin/Repatha/Vascepa (recent LP 8/23, LDL 39, TG improved but still elevated at 176).  - Continue Imdur 120 mg daily.  - Continue Toprol XL 25 mg daily.   - She was unable to tolerate  ranolazine.  2. Bioprosthetic aortic valve replacement: In setting of bicuspid aortic valve, thoracic aorta not dilated on 2017 TEE.  Valve looked ok on 2/23 echo.  Stable on Echo done at [facility] 12/23. 3. Chronic diastolic CHF: Restrictive cardiomyopathy.  Most recent echo in 2/23 shows normal LV systolic function, normal RV systolic function but D-shaped septum suggesting RV pressure/volume overload, mild MS, normal bioprosthetic AoV, no evidence for pericardial constriction. RHC in 8/20 was concerning for right > left heart failure, she did appear to have equalization of diastolic pressures concerning for restrictive cardiomyopathy, possibly related to prior radiation. CPX in 11/20 showed severe HF limitation.  She was evaluated at [facility] for heart/kidney transplant for CKD and restrictive cardiomyopathy, but extensive vascular calcification makes her a poor surgical candidate. Balance of renal failure and RV failure has been difficult. Creatinine 2.2 most recently. She has also struggled more recently w/ worsening symptoms, NYHA Class III. Echo done at [facility] 12/23 showed LVEF 55%, AoV prosthesis functioning normal. RV systolic function reported to be normal. Moderate TR noted. She does not appear to be significantly volume overloaded on exam today however ReDs is elevated at 42%. C/w NYHA Class III symptoms.  - D/w Dr. Aundra Dubin. Will increase torsemide back  to 40 mg daily and will arrange for RHC. Discussed indication for RHC. Pt agreeable.  - Check BMP and BNP today   - Continue spironolactone 12.5 mg daily. Take qhs to limit BP drop during the day.   - No Jardiance/Farxiga given use of insulin pump.   4. CKD:  Patient has a single kidney s/p right nephrectomy and suspected diabetic nephropathy.  CKD stage 4, slowly progressive. I have been concerned for cardiorenal syndrome in the setting of RV > LV failure/restrictive cardiomyopathy.   - Followed by Dr. Hollie Salk  - Not candidate for PD given prior abdominal  surgery.  - If CKD progresses, she may be a candidate for home HD.  - BMET today.  5. Hyperlipidemia: She has not been able to tolerate any higher potency statin than pravastatin 10 mg daily.  Most recent lipids 8/23 with LDL 39, TGs overall much improved but still mildly elevated at 176  - Continue pravastatin.  - Continue Repatha.   - She is on Vascepa for elevated TGs.  6. Anemia: Chronic/slow GI bleed.  Unremarkable EGD and capsule endoscopy in fall 2014.  Unfortunately, needs to stay on Brilinta long-term (Plavix non-responder).   7. Atrial fibrillation: Paroxysmal, very brief episodes noted on PCM interrogation. Given GI bleeding and need to be on Brilinta, risks have been thought to outweigh benefits of anticoagulation. She is not a good Multaq candidate with CHF.  If she needs an antiarrhythmic, consider Tikosyn or Sotalol but renal function may make these meds difficult to use.  Zio monitor in 3/23 showed < 1% AF, longest run 18 minutes. Denies recent symptoms of Afib.  - We were unable to place Watchman device due to nickel allergy.   - Continue Toprol XL 25 mg daily.  8. Carotid bruit: Only mild stenosis on last dopplers in 3/18.  9. SSS: Pacemaker in place.  10. HTN: controlled. Continue current regimen.   11. OSA: Moderate, using Bipap.   12. ?TIA: Transient dysarthria in 3/18, could have been afib-related TIA.  No recurrent symptoms.  13. Thrombocytopenia: Chronic and mild. Follows with hematology.  14. 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  Mild on 2/23 echo.  She would not be a candidate for valve replacement.  67. Hematology: MGUS noted, has seen hematology.  Low suspicion for cardiac amyloidosis  - hematology will repeat myeloma labs in 6 months.  15. Liver Cirrhosis Secondary to NASH - followed by [facility]  - EGD 07/26/22 negative for varices 16. TR - mod on recent echo at [facility] 12/23 - concern for possible worsening RV failure, plan RHC per above    Arrange RHC w/ Dr. Aundra Dubin.  F/u w/ APP after cath.     Signed, Lyda Jester, PA-C  08/09/2022  [facility] [address] [place] [id number] [phone] (office) [phone] (fax)

## 2022-08-09 NOTE — Patient Instructions (Addendum)
Thank you for coming in today  Labs were done today, if any labs are abnormal the clinic will call you No news is good news  INCREASE Torsemide 40 mg daily   You are scheduled for Cardiac Catheterization on 08/21/2022 with Dr. Aundra Dubin.  Please arrive at the [facility] at 6:30 am on the day of your procedure.   DIET  Nothing to eat or drink after midnight except your medications with a sip of water.   MAKE SURE YOU TAKE YOUR ASPIRIN.  DO NOT TAKE these medications before your procedure: Torsemide    YOU MAY TAKE ALL of your remaining medications with a small amount of water.   .Plan for one night stay - bring personal belongings (i.e. toothpaste, toothbrush, etc.)   Bring a current list of your medications and current insurance cards.   Must have a responsible person to drive you home.  Someone must be with you for the first 24 hours after you arrive home.   Please wear clothes that are easy to get on and off and wear slip-on shoes.  * Special note: Every effort is made to have your procedure done on time. Occasionally there are emergencies that present themselves at the hospital that may cause delays. Please be patient if a delay does occur.  If you have any questions after you get home, please call the office at the number listed above.      Do the following things EVERYDAY: Weigh yourself in the morning before breakfast. Write it down and keep it in a log. Take your medicines as prescribed Eat low salt foods--Limit salt (sodium) to 2000 mg per day.  Stay as active as you can everyday Limit all fluids for the day to less than 2 liters  At the [facility], you and your health needs are our priority. As part of our continuing mission to provide you with exceptional heart care, we have created designated Provider Care Teams. These Care Teams include your primary Cardiologist (physician) and Advanced Practice Providers (APPs-  Physician Assistants and Nurse Practitioners) who all work together to provide you with the care you need, when you need it.   You may see any of the following providers on your designated Care Team at your next follow up: Dr Glori Bickers Dr [name] Dr. Roxana Hires, NP Lyda Jester, [place] [facility], [place] Forestine Na, NP Audry Riles, PharmD   Please be sure to bring in all your medications bottles to every appointment.   If you have any questions or concerns before your next appointment please send [name] a message through [place] or call our office at [phone].    TO LEAVE A MESSAGE FOR THE NURSE SELECT OPTION 2, PLEASE LEAVE A MESSAGE INCLUDING: YOUR NAME DATE OF BIRTH CALL BACK NUMBER REASON FOR CALL**this is important as we prioritize the call backs  YOU WILL RECEIVE A CALL BACK THE SAME DAY AS LONG AS YOU CALL BEFORE 4:00 PM

## 2022-08-09 NOTE — H&P (View-Only) (Signed)
Date:  08/09/2022   ID:  [name] [name], DOB 1951-05-29, MRN [id number]   Provider location: [place] Type of Visit: Established patient  PCP:  [name], MD  Cardiologist:  None Primary HF: Dr. Aundra Dubin   History of Present Illness: [name] [name] is a 72 y.o. female who has a history of CAD s/p CABG in 12/05 and redo in 1/11, bioprosthetic AVR, diastolic CHF, and sick sinus syndrome s/p PPM presents for cardiology followup.  She had initial CABG with SVG-RCA in 12/05 followed by redo SVG-RCA and bioprosthetic AVR in 1/11.  She has had diastolic CHF and is on Lasix.  In 9/14, she had a CPX test.  This showed severe functional limitation with ischemic ECG changes (inferolateral ST depression and ST elevation in V1 and V2). She had chest pain.  She had R/LHC in 9/14.  This showed elevated left and right heart filling pressures, patent SVG-PDA, and 80% distal LM/80% ostial LAD stenosis.  She was not a candidate for redo CABG (had 2 prior sternotomies as well as chest radiation for Hodgkins lymphoma).  She had DES from left main into proximal LAD and will need long-term DAPT => Brilinta was used as she has been a poor responder to Plavix in the past.  She re-developed angina in 11/15 and was admitted with unstable angina.  LHC showed 80% ostial LM and patent SVG-RCA. She had DES to ostial LM. Echo in 1/18 showed EF 60-65%, normal bioprosthetic aortic valve, mild-moderate MR, PASP 35 mmHg.    She has chronic iron deficiency anemia thought to be due to a slow GI bleed.  Last EGD was unremarkable and a capsule endoscopy was also unremarkable.  She has had periodic transfusions.     She had bilateral mastectomy for DCIS in 54/27 without complication.   She has an insulin pump followed by an endocrinologist at [facility].    Given myalgias with statins, she was started on Praluent.  LDL is now excellent.     In the past she has had short runs of atrial fibrillation.   She is not anticoagulated. Planned for Watchman placement, but given her nickel allergy, we were unable to place a Watchman.      Diagnosed with OSA, now on CPAP.    Given ongoing problems with exertional dyspnea and occasional chest pain, Cardiolite was repeated in 9/18 and showed inferior changes.  She underwent left/right heart cath in 9/18.  This showed elevated filling pressures, R>L.  There was 75+% ostial LCx stenosis.  However, there were 2 layers of stent from the mid left main into the ostial LAD, and LCx intervention was thought to be very risky.  It was decided to manage her medically.  Echo was repeated in 10/18 showing EF 55-60%, moderate to severe TR.     Given recurrent chest pain, she had ETT-Cardiolite done in 3/19.  This was a normal study with no evidence for ischemia or infarction.  Echo in 1/20 showed EF 55-60%, mild LVH, stable bioprosthetic aortic valve, moderate-severe TR.    Due to increased dyspnea and worsening renal function, she was set up for echo and RHC.  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  [place] in 8/20 showed primarily right-sided HF.  Cardiolite in 8/20  showed no ischemia or infarction.  Torsemide was increased and she cut back on sodium in her diet.    CPX in 11/20 showed severe functional limitation from HF.   She saw Dr. Mosetta Pigeon at [facility] for evaluation for heart/kidney transplant (for restrictive cardiomyopathy).  She was determined to be a poor surgical candidate due to extensive vascular calcifications.   CT chest in 8/21 showed a loculated right pleural effusion but unable to find enough fluid by ultrasound for thoracentesis.   Echo in 9/21 showed EF 60-65% with mild LVH, mild RV dilation with mildly decreased systolic  function and a D-shaped septum, PASP 40, normal bioprosthetic aortic valve, rheumatic-appearing MV with mild-moderate mitral stenosis (mean gradient 9 but MVA by PHT 2.7 cm^2), trivial MR.    Echo 2/23, EF 60-65%, mild LVH, normal RV systolic function but with D-shaped septum, normal bioprosthetic aortic valve, mild mitral stenosis (rheumatic-appearing valve, mean gradient 4 mmHg), mild MR.   Seen by hematology 10/23. Labs showed mild hypercalcemia. Calcium level 10.4 with albumin 4.1. SPEP showed M spike.  Kappa/lambda light chain ratio 2.17. Immunofixation showed polyclonal gammopathy. Plan is to return in 6 months to repeat myeloma labs. She has not had a cMRI due to PPM.   Last OV, 12/23, she endorsed worsening HF symptoms w/ worsening exertional fatigued and dyspnea, in the absence of marked fluid overload. ReDs Clip only 36% but torsemide was increased to 40 mg daily, but dose ultimately reduced back down to aleternating 20-40 mg daily due to bump in SCr. Repeat echo was ordered by Dr. Aundra Dubin but this was actually done at [facility] 12/23. Pt reports study was required by [facility] prior to undergoing an EGD to r/o esophageal varices, as she also has diagnosis of liver cirrhosis secondary to NASH. Per report in Care Everywhere, LVEF 55%, AoV prosthesis functioning normal. RV systolic function reported to be normal. Moderate TR noted.   Returns today for 1 month f/u. Continues to endorse fatigue and abdominal fullness. Wt however fairly stable w/o significant gain. No resting dyspnea. No LEE. Says she had f/u w/ nephrology recently and was told renal fx was stable and agreed w/ reduced dose of torsemide.   ReDs elevated at 42%, though does not appear significantly fluid overloaded on exam. BNP pending    EKG not performed   Unable to obtain MDT device interrogation today, transmitter out of service     Labs (2/19): hgb 13.2 Labs @ [facility] (3/19): K 4.7, creatinine 1.4, ANA negative, RF negative Labs  (4/19): K 3.9, creatinine 1.52 => 1.63 Labs (6/19): LDL 8, HDL 42, TGs 259 Labs (9/19): K 4.9, creatinine 1.65, hgb 13.4, plts 129, LDL 46,TGs 182 Labs (12/19): K 4.9, creatinine 2.07, hgb 12.4 Labs (3/20): K 4, creatinine 1.8 Labs (6/20): K 4.7, creatinine 1.47 Labs (8/20): creatinine 2.13 => 2.46 => 2.4, LDL 22 Labs (9/20): K 4.8, creatinine 2.74, AST/ALT normal, hgb 12.6 Labs (1/21): K 4.1, creatinine 1.86, hgb 11.7 Labs (4/21): K 4.7, creatinine 1.7 Labs (6/21): K 4.2, creatinine 2 => 1.7 Labs (8/21): K 3.8, creatinine 1.69, hgb 10.6, plts 116 Labs (9/21): LDL 34, TGs 209 Labs (1/22): K 3.8, creatinine 2.0 Labs (3/22): LDL 25 Labs (4/22): K 4.7, creatinine 2.0, hgb 12.3 Labs (5/22): K 4.4, creatinine 2.21 Labs (8/22): LDL 1, TGs 330 Labs (10/22): K 4.6, creatinine 1.87 Labs (11/22): LDL 26 Labs (12/22): K 4.1, creatinine 2.25 Labs (1/23): creatinine 2.65 Labs (3/23): K 4.6, creatinine 2.8 Labs (4/23): K 4.1, creatinine 2.16,  BNP 94 Labs (10/23): K 4.2, creatinine 1.62, calcium 10.4  Labs (12/23): K 4.1, creatinine 2.23   PMH: 1. Sick sinus syndrome s/p [employer] PCM.  2. HTN 3. H/o SBO 4. Renal cell carcinoma s/p right nephrectomy in 2008.  5. 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  Fibroscan in 11/14 showed advanced fibrosis concerning for cirrhosis. EGD in 11/14 showed no varices.  6. Diastolic CHF: Echo (9/56) with EF 60-65%, bioprosthetic aortic valve with mean gradient 15 mmHg, mild MR.  CPX (9/14): peak VO2 10.4, VE/VCO2 43.8, inferolateral ST depression and ST elevation in V1/V2 with severe dyspnea and chest pain, severe functional limitation with ischemic changes.  Echo (9/14) with EF 60-65%, normal RV size and systolic function, mild-moderate MR, bioprosthetic aortic valve normal, PA systolic pressure 51 mmHg, no evidence for pericardial constriction though exam incomplete for constriction.  Echo (11/15) with EF 55-60%, bioprosthetic aortic valve, mild-moderate MR,  moderate TR, PA systolic pressure 42 mmHg.  - TEE (5/17) with EF 60-65%, normal bioprosthetic aortic valve with mean gradient 13 mmHg, normal RV size and systolic function. - Echo (1/18) with EF 60-65%, normal bioprosthetic aortic valve, mild-moderate MR, PASP 35 mmHg.  - Echo (10/18) with EF 55-60%, bioprosthetic aortic valve mean gradient 10 mmHg, MAC with mild mitral stenosis (mean gradient 7) and mild mitral regurgitation, moderate to severe TR, PASP 43 mmHg.  - RHC (9/18): mean RA 13, PA 44/18 mean 33, mean PCWP 19, CI 3.45, PVR 2.7 WU.  - Echo (1/20): EF 55-60%, mild LVH, mild MR, stable bioprosthetic aortic valve, moderate-severe TR.  - RHC (8/20): mean RA 14, PA 43/15 mean 28, mean PCWP 13, PAPI 2, PVR 4.2, CI 2.31 - Echo (8/20): EF 55-60%, RV mildly dilated with normal systolic function, mildly D-shaped septum suggestive of RV pressure/volume overload, rheumatic-appearing mitral valve with mild mitral stenosis (mean gradient 6, MVA by PHT 4 cm^2), no evidence for pericardial constriction, moderate TR, bioprosthetic aortic valve functioning normally with mean gradient 6 mmHg.  - CPX (11/20): peak VO2 9.8, VE/VCO2 slope 38, RER 1.11 => severe functional limitation from HF.   - Echo (9/21): EF 60-65% with mild LVH, mild RV dilation with mildly decreased systolic function and a D-shaped septum, PASP 40, normal bioprosthetic aortic valve, rheumatic-appearing MV with mild-moderate mitral stenosis (mean gradient 9 but MVA by PHT 2.7 cm^2, trivial MR.  - Echo (2/23): EF 60-65%, mild LVH, normal RV systolic function but with D-shaped septum, normal bioprosthetic aortic valve, mild mitral stenosis (rheumatic-appearing valve, mean gradient 4 mmHg), mild MR.  7. TAH-BSO 8. Type I diabetes: Has insulin pump.  9. H/o ischemic colitis. 10. H/o PUD. 11. Diabetic gastroparesis. 12. Hyperlipidemia: Myalgias with Crestor and atorvastatin, elevated LFTs with simvastatin.  Myalgias with > 10 mg pravastatin. Now  on Praluent.  13. CKD stage IV: Sees Dr. Lorrene Reid 14. Bicuspid aortic valve s/p 19 mm Edwards pericardial valve in 1/11 (had post-operative Dresslers syndrome).   15. CAD: CABG with SVG-RCA in 12/05.  Redo SVG-RCA in 1/11 with AVR.  LHC/RHC (9/14) with mean RA 18, PA 62/25 mean 43, mean PCWP 26, CI 2.8, 70-80% distal LM stenosis, 80% ostial LAD stenosis, total occlusion RCA, SVG-PDA patent.  Patient had DES LM into LAD.  Plan to continue long-term ASA/Brilinta (poor responder to Plavix).  ETT-cardiolite (4/15) with 6' exercise, EF 60%, ST depression in recovery and mild chest pain, no ischemia or infarction by perfusion images.  Unstable angina 11/15 with 80% LM, patent SVG-RCA => DES to  ostial LM.   - Lexiscan Cardiolite (2/18): EF 62%, no ischemia or infarction (normal).  She had a significant reaction to [place] and probably should not get again.  - Cardiolite (9/18): EF 72%, prior inferior infarct with mild peri-infarct ischemia.  - LHC (9/18): 2 layers stent left main - ostial LAD with 50% dLM in-stent restenosis, 75+% ostial LCx, totally occluded RCA, SVG-RCA patent.  Medical management planned.  - ETT-Cardiolite (4/19): EF normal, 7'11" exercise, no ischemia or infarction.  - Lexiscan Cardiolite (8/20): No ischemia/infarction.  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  17. Anemia: Iron-deficiency.  Suspect chronic GI blood loss.  EGD in 9/14 was unremarkable.  Capsule endoscopy 10/14 was unremarkable.  Has required periodic blood transfusions.  18. Atrial fibrillation: Paroxysmal, noted by [facility] interrogation.  - Zio monitor 3/23: < 1% atrial fibrillation, longest run was 18 minutes.  19. Carotid stenosis: Carotid dopplers (2/15) with 40-59% bilateral stenosis. Carotid dopplers (3/16) with 40-59% bilateral ICA stenosis. Carotid dopplers (3/17) with 40-59% bilateral ICA stenosis.  - Carotid dopplers (3/18) with mild BICA stenosis.  20. Right leg pain:  Suspected focal dissection right CFA/EIA on 3/16 CTA, probably catheterization complication.  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  22. C difficile colitis 2017 23. Gout 24. ?TIA: head CT negative.  25. OSA: Moderate, uses CPAP.  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 Hypercalcemia 28. 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  Mild-moderate on 9/21 echo.  Mild on 2/23 echo.  29. Tricuspid regurgitation: Moderate by 8/20 echo.  30. Lung nodules: Followed at [facility] => stable.  31. ?Parkinsonism: She did not tolerate Sinemet.   Current Outpatient Medications  Medication Sig Dispense Refill   acyclovir ointment (ZOVIRAX) 5 % Apply 1 application topically every 3 (three) hours as needed (fever blister/cold sores).     ALPRAZolam (XANAX) 0.5 MG tablet Take 1 tablet (0.5 mg total) by mouth 2 (two) times daily as needed for anxiety. 60 tablet 1   APIDRA 100 UNIT/ML injection Inject 40 Units into the skin as directed. INFUSE THROUGH INSULIN PUMP UTD, Bolus depends on carb intake  2   aspirin EC 81 MG tablet Take 81 mg by mouth at bedtime.     bisacodyl (DULCOLAX) 5 MG EC tablet Take 1 tablet (5 mg total) by mouth daily as needed for moderate constipation. 30 tablet 0   BRILINTA 60 MG TABS tablet TAKE ONE TABLET (60MG TOTAL) BY MOUTH TWICE DAILY 180 tablet 3   cetirizine (ZYRTEC) 10 MG tablet Take 10 mg by mouth daily.     clobetasol (OLUX) 0.05 % topical foam Apply 1 application topically 2 (two) times daily as needed (skin flaking).     Coenzyme Q10 (CO Q 10) 100 MG CAPS Take 100 mg by mouth at bedtime.      colchicine 0.6 MG tablet Take 0.6 mg by mouth daily as needed (gout flare).     denosumab (PROLIA) 60 MG/ML SOSY injection Inject 60 mg into the skin every 6 (six) months.     diclofenac Sodium (VOLTAREN) 1 % GEL Apply 2 g topically 4 (four) times daily. As needed     escitalopram (LEXAPRO) 5 MG tablet Take 5 mg by mouth daily.     famotidine (PEPCID) 40 MG  tablet Take 40 mg by mouth daily. Takes a second tablet at night if needed.     glucagon (GLUCAGON EMERGENCY) 1 MG injection Inject 1 mg into the vein once as  needed (low blood sugar).     isosorbide mononitrate (IMDUR) 120 MG 24 hr tablet TAKE ONE TABLET (120MG TOTAL) BY MOUTH DAILY 90 tablet 3   latanoprost (XALATAN) 0.005 % ophthalmic solution Place 1 drop into both eyes at bedtime.     metolazone (ZAROXOLYN) 2.5 MG tablet Take 2.5 mg (1 tab) once as directed by CHF clinic. Call before taking [phone] 3 tablet 0   metoprolol succinate (TOPROL XL) 25 MG 24 hr tablet Take 1 tablet (25 mg total) by mouth daily. 90 tablet 3   mometasone (NASONEX) 50 MCG/ACT nasal spray Place 2 sprays into the nose daily as needed (allergy).     mupirocin cream (BACTROBAN) 2 % Apply 1 application topically 2 (two) times daily as needed. 90 g 0   nitroGLYCERIN (NITROLINGUAL) 0.4 MG/SPRAY spray PLACE 1 SPRAY UNDER THE TONGUE EVERY 5 MINUTES FOR 3 DOSES AS NEEDED FOR CHEST PAIN. 14.7 g 0   polyethylene glycol (MIRALAX / GLYCOLAX) 17 g packet Take 17 g by mouth as needed.     pravastatin (PRAVACHOL) 10 MG tablet TAKE ONE TABLET (10 MG TOTAL) BY MOUTH DAILY. 90 tablet 3   Probiotic Product (ALIGN PO) Take 1 tablet by mouth daily in the afternoon.     REPATHA SURECLICK 003 MG/ML SOAJ INJECT 140MG INTO THE SKIN EVERY 14 DAYS 6 mL 3   rOPINIRole (REQUIP) 0.25 MG tablet Take 1 tablet (0.25 mg total) by mouth at bedtime. Please call to schedule an overdue appointment with Dr. Radford Pax for refills, [phone], thank you. 1st attempt 30 tablet 0   senna (SENOKOT) 8.6 MG tablet Take 1 tablet by mouth as needed for constipation.     silver nitrate applicators 45-03 % applicator Use as needed to stop superficial skin bleeding. 10 each 0   spironolactone (ALDACTONE) 25 MG tablet Take 0.5 tablets (12.5 mg total) by mouth at bedtime. 45 tablet 0   torsemide (DEMADEX) 20 MG tablet Take 2 tablets (40 mg total) by mouth every other  day AND 1 tablet (20 mg total) every other day. 180 tablet 3   ULORIC 40 MG tablet Take 1 tablet (40 mg total) by mouth at bedtime. 90 tablet 3   VASCEPA 1 g capsule TAKE TWO CAPSULES (2 GRAMS TOTAL) BY MOUTH TWO TIMES DAILY 360 capsule 3   Wheat Dextrin (BENEFIBER) POWD Take 1 Dose by mouth daily as needed.     No current facility-administered medications for this encounter.    Allergies:   Avandia [rosiglitazone maleate], Cephalexin, Clindamycin, Clindamycin hcl, Humalog [insulin lispro], Lincomycin, Metformin, Novolog [insulin aspart], Penicillins, Rosiglitazone, Tizanidine, Adhesive [tape], Atorvastatin, Cholestyramine, Gentamycin [gentamicin], Iodinated contrast media, Limonene, Prednisone, Ranexa [ranolazine], Rosuvastatin, Simvastatin, Azelastine, Clopidogrel, Kerendia [finerenone], Lisinopril, Oseltamivir phosphate, Tizanidine hcl, Codeine, Erythromycin, Hydrocodone-acetaminophen, Latex, Neomycin, Nickel, Ranitidine, Tamiflu [oseltamivir], and Tramadol   Social History:  The patient  reports that she has never smoked. She has never used smokeless tobacco. She reports that she does not drink alcohol and does not use drugs.   Family History:  The patient's family history includes Anal fissures in her mother; Breast cancer (age of onset: 67) in her sister; Cancer (age of onset: 61) in her sister; Cancer (age of onset: 50) in her maternal grandfather; Diabetes in her father; Glaucoma in her brother; Heart attack in her father, maternal grandmother, and mother; Heart disease in her father and mother; Hypertension in her brother; Lung cancer in her sister; Parkinson's disease in her paternal aunt.   ROS:  Please see  the history of present illness.   All other systems are personally reviewed and negative.   Exam:   BP 130/80   Pulse 76   Wt 51.3 kg (113 lb)   SpO2 100%   BMI 20.02 kg/m  PHYSICAL EXAM: General:  chronically ill appearing. No respiratory difficulty HEENT: normal Neck:  supple. JVD 8 cm. Carotids 2+ bilat; no bruits. No lymphadenopathy or thyromegaly appreciated. Cor: PMI nondisplaced. Regular rate & rhythm. No rubs, gallops or murmurs. Lungs: clear Abdomen: soft, nontender, mildly distended. No hepatosplenomegaly. No bruits or masses. Good bowel sounds. Extremities: no cyanosis, clubbing, rash, edema Neuro: alert & oriented x 3, cranial nerves grossly intact. moves all 4 extremities w/o difficulty. Affect pleasant.   Wt Readings from Last 3 Encounters:  08/09/22 51.3 kg (113 lb)  07/10/22 50.3 kg (111 lb)  07/10/22 50.3 kg (111 lb)    ASSESSMENT AND PLAN:  1. CAD: s/p SVG-RCA in 12/05, then redo SVG-RCA in 1/11--> distal left main/proximal LAD severe stenosis in 9/14.  PCI with DES to distal LM/proximal LAD was done rather than bypass as she would have been a poor candidate for redo sternotomy (2 prior sternotomies and history of chest wall radiation for Hodgkins disease).  Recurrent unstable angina with 80% ostial LM treated with repeat DES in 11/15.  Lexiscan Cardiolite in 9/18 inferior changes.  Repeat LHC (9/18) showed 50% distal left main in-stent restenosis and 75+% ostial LCx stenosis. I reviewed the films at the time with interventional cardiology => would be very difficult to intervene on the ostial LCx through 2 overlapping stents from left main into the LAD.  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 Given no ACS, we planned medical management and increased Imdur.  Cardiolite in 3/19 and again in 8/20 showed no ischemia or infarction.  She has had some chest pain with heavy exertion, this seems chronic.  - Continue Brilinta 60 mg bid (poor responder to Plavix) long-term given recurrent left main disease.  - Continue ASA 81, pravastin/Repatha/Vascepa (recent LP 8/23, LDL 39, TG improved but still elevated at 176).  - Continue Imdur 120 mg daily.  - Continue Toprol XL 25 mg daily.   - She was unable to tolerate  ranolazine.  2. Bioprosthetic aortic valve replacement: In setting of bicuspid aortic valve, thoracic aorta not dilated on 2017 TEE.  Valve looked ok on 2/23 echo.  Stable on Echo done at [facility] 12/23. 3. Chronic diastolic CHF: Restrictive cardiomyopathy.  Most recent echo in 2/23 shows normal LV systolic function, normal RV systolic function but D-shaped septum suggesting RV pressure/volume overload, mild MS, normal bioprosthetic AoV, no evidence for pericardial constriction. RHC in 8/20 was concerning for right > left heart failure, she did appear to have equalization of diastolic pressures concerning for restrictive cardiomyopathy, possibly related to prior radiation. CPX in 11/20 showed severe HF limitation.  She was evaluated at [facility] for heart/kidney transplant for CKD and restrictive cardiomyopathy, but extensive vascular calcification makes her a poor surgical candidate. Balance of renal failure and RV failure has been difficult. Creatinine 2.2 most recently. She has also struggled more recently w/ worsening symptoms, NYHA Class III. Echo done at [facility] 12/23 showed LVEF 55%, AoV prosthesis functioning normal. RV systolic function reported to be normal. Moderate TR noted. She does not appear to be significantly volume overloaded on exam today however ReDs is elevated at 42%. C/w NYHA Class III symptoms.  - D/w Dr. Aundra Dubin. Will increase torsemide back  to 40 mg daily and will arrange for RHC. Discussed indication for RHC. Pt agreeable.  - Check BMP and BNP today   - Continue spironolactone 12.5 mg daily. Take qhs to limit BP drop during the day.   - No Jardiance/Farxiga given use of insulin pump.   4. CKD:  Patient has a single kidney s/p right nephrectomy and suspected diabetic nephropathy.  CKD stage 4, slowly progressive. I have been concerned for cardiorenal syndrome in the setting of RV > LV failure/restrictive cardiomyopathy.   - Followed by Dr. Hollie Salk  - Not candidate for PD given prior abdominal  surgery.  - If CKD progresses, she may be a candidate for home HD.  - BMET today.  5. Hyperlipidemia: She has not been able to tolerate any higher potency statin than pravastatin 10 mg daily.  Most recent lipids 8/23 with LDL 39, TGs overall much improved but still mildly elevated at 176  - Continue pravastatin.  - Continue Repatha.   - She is on Vascepa for elevated TGs.  6. Anemia: Chronic/slow GI bleed.  Unremarkable EGD and capsule endoscopy in fall 2014.  Unfortunately, needs to stay on Brilinta long-term (Plavix non-responder).   7. Atrial fibrillation: Paroxysmal, very brief episodes noted on PCM interrogation. Given GI bleeding and need to be on Brilinta, risks have been thought to outweigh benefits of anticoagulation. She is not a good Multaq candidate with CHF.  If she needs an antiarrhythmic, consider Tikosyn or Sotalol but renal function may make these meds difficult to use.  Zio monitor in 3/23 showed < 1% AF, longest run 18 minutes. Denies recent symptoms of Afib.  - We were unable to place Watchman device due to nickel allergy.   - Continue Toprol XL 25 mg daily.  8. Carotid bruit: Only mild stenosis on last dopplers in 3/18.  9. SSS: Pacemaker in place.  10. HTN: controlled. Continue current regimen.   11. OSA: Moderate, using Bipap.   12. ?TIA: Transient dysarthria in 3/18, could have been afib-related TIA.  No recurrent symptoms.  13. Thrombocytopenia: Chronic and mild. Follows with hematology.  14. 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  Mild on 2/23 echo.  She would not be a candidate for valve replacement.  67. Hematology: MGUS noted, has seen hematology.  Low suspicion for cardiac amyloidosis  - hematology will repeat myeloma labs in 6 months.  15. Liver Cirrhosis Secondary to NASH - followed by [facility]  - EGD 07/26/22 negative for varices 16. TR - mod on recent echo at [facility] 12/23 - concern for possible worsening RV failure, plan RHC per above    Arrange RHC w/ Dr. Aundra Dubin.  F/u w/ APP after cath.     Signed, Lyda Jester, PA-C  08/09/2022  [facility] [address] [place] [id number] [phone] (office) [phone] (fax)

## 2022-08-10 ENCOUNTER — Ambulatory Visit
Admission: RE | Admit: 2022-08-10 | Discharge: 2022-08-10 | Disposition: A | Discharge status: Home / Self Care | Payer: Medicare PPO | Source: Ambulatory Visit | Attending: Family Medicine | Admitting: Family Medicine

## 2022-08-10 ENCOUNTER — Telehealth (HOSPITAL_COMMUNITY): Payer: Self-pay | Admitting: *Deleted

## 2022-08-10 DIAGNOSIS — R918 Other nonspecific abnormal finding of lung field: Secondary | ICD-10-CM | POA: Diagnosis not present

## 2022-08-10 DIAGNOSIS — R911 Solitary pulmonary nodule: Secondary | ICD-10-CM | POA: Diagnosis not present

## 2022-08-10 DIAGNOSIS — J90 Pleural effusion, not elsewhere classified: Secondary | ICD-10-CM | POA: Diagnosis not present

## 2022-08-10 DIAGNOSIS — I700 Atherosclerosis of aorta: Secondary | ICD-10-CM | POA: Diagnosis not present

## 2022-08-10 DIAGNOSIS — R9389 Abnormal findings on diagnostic imaging of other specified body structures: Secondary | ICD-10-CM

## 2022-08-12 ENCOUNTER — Encounter: Payer: Self-pay | Admitting: Cardiology

## 2022-08-12 NOTE — Procedures (Signed)
   Patient Name: [name] [name], [name] [name] Date: 08/09/2022 Gender: Female D.O.B: 1951-04-02 Age (years): 70 Referring Provider: Fransico Him MD, ABSM Height (inches): 62 Interpreting Physician: Fransico Him MD, ABSM Weight (lbs): 110 RPSGT: Baxter Flattery BMI: 20 MRN: [id number] Neck Size: 13.00  CLINICAL INFORMATION Sleep Study Type: NPSG  Indication for sleep study: Witnesses Apnea / Gasping During Sleep  Epworth Sleepiness Score: 16  Most recent polysomnogram was on 01/06/2017. Most recent titration study was on 08/09/2022.  SLEEP STUDY TECHNIQUE As per the AASM Manual for the Scoring of Sleep and Associated Events v2.3 (April 2016) with a hypopnea requiring 4% desaturations.  The channels recorded and monitored were frontal, central and occipital EEG, electrooculogram (EOG), submentalis EMG (chin), nasal and oral airflow, thoracic and abdominal wall motion, anterior tibialis EMG, snore microphone, electrocardiogram, and pulse oximetry.  MEDICATIONS Medications self-administered by patient taken the night of the study : ALKA-SELTZER WITHOUT ASPIRIN, AMBIEN, apidra, brilinta, febuxostat, IMDUR, LEXAPRO, MAGNESIUM OXIDE, PEPCID, PRAVACHOL, REQUIP, TOPROL XL, vascepa, VIBRA-TABS, XALATAN, XANAX  SLEEP ARCHITECTURE The study was initiated at 10:09:36 PM and ended at 4:57:00 AM.  Sleep onset time was 17.0 minutes and the sleep efficiency was 93.1%. The total sleep time was 379.4 minutes.  Stage REM latency was 339.5 minutes.  The patient spent 0.8% of the night in stage N1 sleep, 82.7% in stage N2 sleep, 4.7% in stage N3 and 11.7% in REM.  Alpha intrusion was absent.  Supine sleep was 8.98%.  RESPIRATORY PARAMETERS The overall apnea/hypopnea index (AHI) was 63.1 per hour. There were 398 total apneas, including 88 obstructive, 309 central and 1 mixed apneas. There were 1 hypopneas and 0 RERAs.  The AHI during Stage REM sleep was 32.4 per hour.  AHI while supine was 61.6 per  hour.  The mean oxygen saturation was 95.0%. The minimum SpO2 during sleep was 89.0%.  soft snoring was noted during this study.  CARDIAC DATA The 2 lead EKG demonstrated sinus rhythm, pacemaker generated. The mean heart rate was 79.3 beats per minute. Other EKG findings include: None.  LEG MOVEMENT DATA The total PLMS were 0 with a resulting PLMS index of 0.0. Associated arousal with leg movement index was 0.0 .  IMPRESSIONS - Severe obstructive sleep apnea occurred during this study (AHI = 63.1/h). - Severe central sleep apnea occurred during this study (CAI = 48.9/h). - The patient had minimal or no oxygen desaturation during the study (Min O2 = 89.0%) - The patient snored with soft snoring volume. - No cardiac abnormalities were noted during this study. - Clinically significant periodic limb movements did not occur during sleep. No significant associated arousals.  DIAGNOSIS - Obstructive Sleep Apnea (G47.33) - Central Sleep Apnea (G47.37) - Nocturnal Hypoxemia (G47.36)  RECOMMENDATIONS - BiPAP titration to determine optimal pressure required to alleviate sleep disordered breathing. BiPAP or ASV titration may be required to eliminate central sleep apnea. - Avoid alcohol, sedatives and other CNS depressants that may worsen sleep apnea and disrupt normal sleep architecture. - Sleep hygiene should be reviewed to assess factors that may improve sleep quality. - Weight management and regular exercise should be initiated or continued if appropriate.  [Electronically signed] 08/12/2022 05:09 PM  Fransico Him MD, ABSM Diplomate, American Board of Sleep Medicine

## 2022-08-13 ENCOUNTER — Encounter (HOSPITAL_COMMUNITY): Payer: Self-pay | Admitting: Cardiology

## 2022-08-15 ENCOUNTER — Encounter (HOSPITAL_COMMUNITY): Payer: Self-pay

## 2022-08-15 ENCOUNTER — Telehealth (HOSPITAL_COMMUNITY): Payer: Self-pay

## 2022-08-15 NOTE — Telephone Encounter (Signed)
I spoke with patient and she expressed understanding of her medication information.

## 2022-08-21 ENCOUNTER — Ambulatory Visit (HOSPITAL_COMMUNITY)
Admission: RE | Admit: 2022-08-21 | Discharge: 2022-08-21 | Disposition: A | Discharge status: Home / Self Care | Payer: Medicare PPO | Source: Ambulatory Visit | Attending: Cardiology | Admitting: Cardiology

## 2022-08-21 ENCOUNTER — Other Ambulatory Visit: Payer: Self-pay

## 2022-08-21 ENCOUNTER — Encounter (HOSPITAL_COMMUNITY): Payer: Self-pay | Admitting: Cardiology

## 2022-08-21 ENCOUNTER — Encounter (HOSPITAL_COMMUNITY)
Admission: RE | Disposition: A | Discharge status: Home / Self Care | Payer: Self-pay | Source: Ambulatory Visit | Attending: Cardiology

## 2022-08-21 DIAGNOSIS — Z955 Presence of coronary angioplasty implant and graft: Secondary | ICD-10-CM | POA: Insufficient documentation

## 2022-08-21 DIAGNOSIS — N184 Chronic kidney disease, stage 4 (severe): Secondary | ICD-10-CM | POA: Diagnosis not present

## 2022-08-21 DIAGNOSIS — Z79899 Other long term (current) drug therapy: Secondary | ICD-10-CM | POA: Insufficient documentation

## 2022-08-21 DIAGNOSIS — G4733 Obstructive sleep apnea (adult) (pediatric): Secondary | ICD-10-CM | POA: Insufficient documentation

## 2022-08-21 DIAGNOSIS — I495 Sick sinus syndrome: Secondary | ICD-10-CM | POA: Insufficient documentation

## 2022-08-21 DIAGNOSIS — I509 Heart failure, unspecified: Secondary | ICD-10-CM

## 2022-08-21 DIAGNOSIS — Q231 Congenital insufficiency of aortic valve: Secondary | ICD-10-CM | POA: Insufficient documentation

## 2022-08-21 DIAGNOSIS — K7460 Unspecified cirrhosis of liver: Secondary | ICD-10-CM | POA: Insufficient documentation

## 2022-08-21 DIAGNOSIS — I5032 Chronic diastolic (congestive) heart failure: Secondary | ICD-10-CM

## 2022-08-21 DIAGNOSIS — I2510 Atherosclerotic heart disease of native coronary artery without angina pectoris: Secondary | ICD-10-CM | POA: Diagnosis not present

## 2022-08-21 DIAGNOSIS — I480 Paroxysmal atrial fibrillation: Secondary | ICD-10-CM | POA: Diagnosis not present

## 2022-08-21 DIAGNOSIS — I130 Hypertensive heart and chronic kidney disease with heart failure and stage 1 through stage 4 chronic kidney disease, or unspecified chronic kidney disease: Secondary | ICD-10-CM | POA: Diagnosis not present

## 2022-08-21 DIAGNOSIS — I2721 Secondary pulmonary arterial hypertension: Secondary | ICD-10-CM | POA: Diagnosis not present

## 2022-08-21 DIAGNOSIS — Z953 Presence of xenogenic heart valve: Secondary | ICD-10-CM | POA: Diagnosis not present

## 2022-08-21 DIAGNOSIS — E785 Hyperlipidemia, unspecified: Secondary | ICD-10-CM | POA: Insufficient documentation

## 2022-08-21 DIAGNOSIS — Z7902 Long term (current) use of antithrombotics/antiplatelets: Secondary | ICD-10-CM | POA: Insufficient documentation

## 2022-08-21 DIAGNOSIS — I425 Other restrictive cardiomyopathy: Secondary | ICD-10-CM

## 2022-08-21 DIAGNOSIS — Z7982 Long term (current) use of aspirin: Secondary | ICD-10-CM | POA: Diagnosis not present

## 2022-08-21 DIAGNOSIS — Z905 Acquired absence of kidney: Secondary | ICD-10-CM | POA: Diagnosis not present

## 2022-08-21 DIAGNOSIS — Z950 Presence of cardiac pacemaker: Secondary | ICD-10-CM | POA: Diagnosis not present

## 2022-08-21 HISTORY — PX: RIGHT HEART CATH: CATH118263

## 2022-08-21 LAB — POCT I-STAT EG7
Acid-Base Excess: 2 mmol/L (ref 0.0–2.0)
Acid-Base Excess: 2 mmol/L (ref 0.0–2.0)
Bicarbonate: 29.1 mmol/L — ABNORMAL HIGH (ref 20.0–28.0)
Bicarbonate: 29.3 mmol/L — ABNORMAL HIGH (ref 20.0–28.0)
Calcium, Ion: 1.33 mmol/L (ref 1.15–1.40)
Calcium, Ion: 1.37 mmol/L (ref 1.15–1.40)
HCT: 36 % (ref 36.0–46.0)
HCT: 36 % (ref 36.0–46.0)
Hemoglobin: 12.2 g/dL (ref 12.0–15.0)
Hemoglobin: 12.2 g/dL (ref 12.0–15.0)
O2 Saturation: 55 %
O2 Saturation: 56 %
Potassium: 4.4 mmol/L (ref 3.5–5.1)
Potassium: 4.5 mmol/L (ref 3.5–5.1)
Sodium: 142 mmol/L (ref 135–145)
Sodium: 141 mmol/L (ref 135–145)
TCO2: 31 mmol/L (ref 22–32)
TCO2: 31 mmol/L (ref 22–32)
pCO2, Ven: 55.1 mmHg (ref 44–60)
pCO2, Ven: 54.8 mmHg (ref 44–60)
pH, Ven: 7.331 (ref 7.25–7.43)
pH, Ven: 7.336 (ref 7.25–7.43)
pO2, Ven: 32 mmHg (ref 32–45)
pO2, Ven: 32 mmHg (ref 32–45)

## 2022-08-21 LAB — BASIC METABOLIC PANEL
Anion gap: 10 (ref 5–15)
BUN: 61 mg/dL — ABNORMAL HIGH (ref 8–23)
CO2: 30 mmol/L (ref 22–32)
Calcium: 10.8 mg/dL — ABNORMAL HIGH (ref 8.9–10.3)
Chloride: 99 mmol/L (ref 98–111)
Creatinine, Ser: 2.91 mg/dL — ABNORMAL HIGH (ref 0.44–1.00)
GFR, Estimated: 17 mL/min — ABNORMAL LOW (ref >60)
Glucose, Bld: 131 mg/dL — ABNORMAL HIGH (ref 70–99)
Potassium: 4.2 mmol/L (ref 3.5–5.1)
Sodium: 139 mmol/L (ref 135–145)

## 2022-08-21 LAB — CBC
HCT: 40.9 % (ref 36.0–46.0)
Hemoglobin: 13.6 g/dL (ref 12.0–15.0)
MCH: 30.2 pg (ref 26.0–34.0)
MCHC: 33.3 g/dL (ref 30.0–36.0)
MCV: 90.7 fL (ref 80.0–100.0)
Platelets: 136 10*3/uL — ABNORMAL LOW (ref 150–400)
RBC: 4.51 MIL/uL (ref 3.87–5.11)
RDW: 13.2 % (ref 11.5–15.5)
WBC: 8.4 10*3/uL (ref 4.0–10.5)
nRBC: 0 % (ref 0.0–0.2)

## 2022-08-21 LAB — GLUCOSE, CAPILLARY
Glucose-Capillary: 130 mg/dL — ABNORMAL HIGH (ref 70–99)
Glucose-Capillary: 124 mg/dL — ABNORMAL HIGH (ref 70–99)

## 2022-08-21 SURGERY — RIGHT HEART CATH
Anesthesia: LOCAL

## 2022-08-21 MED ORDER — HEPARIN (PORCINE) IN NACL 1000-0.9 UT/500ML-% IV SOLN
INTRAVENOUS | Status: AC
  Filled 2022-08-21: qty 500

## 2022-08-21 MED ORDER — HEPARIN (PORCINE) IN NACL 1000-0.9 UT/500ML-% IV SOLN
INTRAVENOUS | Status: DC | PRN
  Administered 2022-08-21: 500 mL

## 2022-08-21 MED ORDER — HYDRALAZINE HCL 20 MG/ML IJ SOLN: 10.0000 mg | INTRAMUSCULAR | Status: DC | PRN

## 2022-08-21 MED ORDER — SODIUM CHLORIDE 0.9 % IV SOLN: 250.0000 mL | INTRAVENOUS | Status: DC | PRN

## 2022-08-21 MED ORDER — SODIUM CHLORIDE 0.9 % IV SOLN: INTRAVENOUS | Status: DC

## 2022-08-21 MED ORDER — SODIUM CHLORIDE 0.9% FLUSH: 3.0000 mL | Freq: Two times a day (BID) | INTRAVENOUS | Status: DC

## 2022-08-21 MED ORDER — SODIUM CHLORIDE 0.9% FLUSH: 3.0000 mL | INTRAVENOUS | Status: DC | PRN

## 2022-08-21 MED ORDER — LABETALOL HCL 5 MG/ML IV SOLN: 10.0000 mg | INTRAVENOUS | Status: DC | PRN

## 2022-08-21 MED ORDER — LIDOCAINE HCL (PF) 1 % IJ SOLN
INTRAMUSCULAR | Status: AC
  Filled 2022-08-21: qty 30

## 2022-08-21 MED ORDER — LIDOCAINE HCL (PF) 1 % IJ SOLN
INTRAMUSCULAR | Status: DC | PRN
  Administered 2022-08-21: 2 mL

## 2022-08-21 MED ORDER — ACETAMINOPHEN 325 MG PO TABS: 650.0000 mg | ORAL_TABLET | ORAL | Status: DC | PRN

## 2022-08-21 MED ORDER — ONDANSETRON HCL 4 MG/2ML IJ SOLN: 4.0000 mg | Freq: Four times a day (QID) | INTRAMUSCULAR | Status: DC | PRN

## 2022-08-21 SURGICAL SUPPLY — 6 items
CATH BALLN WEDGE 5F 110CM (CATHETERS) IMPLANT
PACK CARDIAC CATHETERIZATION (CUSTOM PROCEDURE TRAY) ×1 IMPLANT
SHEATH GLIDE SLENDER 4/5FR (SHEATH) IMPLANT
TRANSDUCER W/STOPCOCK (MISCELLANEOUS) ×1 IMPLANT
TUBING ART PRESS 72  MALE/FEM (TUBING) ×1
TUBING ART PRESS 72 MALE/FEM (TUBING) IMPLANT

## 2022-08-21 NOTE — Telephone Encounter (Signed)
Repeat labs scheduled 2/7 '@11'$  My chart message sent to patient with details

## 2022-08-21 NOTE — Interval H&P Note (Signed)
History and Physical Interval Note:  08/21/2022 8:48 AM  [name] [name]  has presented today for surgery, with the diagnosis of hf.  The various methods of treatment have been discussed with the patient and family. After consideration of risks, benefits and other options for treatment, the patient has consented to  Procedure(s): RIGHT HEART CATH (N/A) as a surgical intervention.  The patient's history has been reviewed, patient examined, no change in status, stable for surgery.  I have reviewed the patient's chart and labs.  Questions were answered to the patient's satisfaction.     [name] [name] [employer]

## 2022-08-21 NOTE — Telephone Encounter (Signed)
-----  Message from Larey Dresser, MD sent at 08/21/2022  9:11 AM EST ----- Please arrange for BMET in about 10 days.

## 2022-08-22 ENCOUNTER — Telehealth: Payer: Self-pay | Admitting: Cardiology

## 2022-08-22 ENCOUNTER — Telehealth: Payer: Self-pay | Admitting: *Deleted

## 2022-08-22 DIAGNOSIS — G4733 Obstructive sleep apnea (adult) (pediatric): Secondary | ICD-10-CM

## 2022-08-22 NOTE — Telephone Encounter (Signed)
Cpap supply order placed to [facility] via community message.

## 2022-08-22 NOTE — Telephone Encounter (Signed)
What problem are you experiencing? Is running out of CPAP supplies is anxious to know the next steps  Who is your medical equipment company? [employer]    Please route to the sleep study assistant.

## 2022-08-23 ENCOUNTER — Telehealth: Payer: Self-pay | Admitting: *Deleted

## 2022-08-23 ENCOUNTER — Encounter (HOSPITAL_BASED_OUTPATIENT_CLINIC_OR_DEPARTMENT_OTHER): Payer: Medicare PPO | Admitting: Cardiology

## 2022-08-23 DIAGNOSIS — G4733 Obstructive sleep apnea (adult) (pediatric): Secondary | ICD-10-CM

## 2022-08-23 DIAGNOSIS — G2581 Restless legs syndrome: Secondary | ICD-10-CM

## 2022-08-23 DIAGNOSIS — I5032 Chronic diastolic (congestive) heart failure: Secondary | ICD-10-CM

## 2022-08-23 DIAGNOSIS — I10 Essential (primary) hypertension: Secondary | ICD-10-CM

## 2022-08-23 NOTE — Telephone Encounter (Signed)
-----  Message from Lauralee Evener, [place] sent at 08/13/2022  9:55 AM EST -----  ----- Message ----- From: Sueanne Margarita, MD Sent: 08/12/2022   5:11 PM EST To: [facility] Sleep Studies  Severe OSA with significant central apneas - refer back to sleep lab ASAP for BiPAP titration and likely will need S/T

## 2022-08-23 NOTE — Telephone Encounter (Signed)
The patient has been notified of the result and verbalized understanding.  All questions (if any) were answered. Marolyn Hammock, CMA 08/23/2022 82:51 AM    Will precert bipap titration

## 2022-08-24 ENCOUNTER — Other Ambulatory Visit (HOSPITAL_COMMUNITY): Payer: Self-pay | Admitting: Urology

## 2022-08-24 DIAGNOSIS — Z85528 Personal history of other malignant neoplasm of kidney: Secondary | ICD-10-CM

## 2022-08-24 DIAGNOSIS — N281 Cyst of kidney, acquired: Secondary | ICD-10-CM

## 2022-08-24 NOTE — Telephone Encounter (Signed)
Called patient and call was completed.

## 2022-08-26 ENCOUNTER — Ambulatory Visit (HOSPITAL_BASED_OUTPATIENT_CLINIC_OR_DEPARTMENT_OTHER): Payer: Medicare PPO | Attending: Cardiology | Admitting: Cardiology

## 2022-08-26 VITALS — Ht 62.0 in | Wt 110.0 lb

## 2022-08-26 DIAGNOSIS — I110 Hypertensive heart disease with heart failure: Secondary | ICD-10-CM | POA: Insufficient documentation

## 2022-08-26 DIAGNOSIS — G4733 Obstructive sleep apnea (adult) (pediatric): Secondary | ICD-10-CM

## 2022-08-26 DIAGNOSIS — G2581 Restless legs syndrome: Secondary | ICD-10-CM | POA: Insufficient documentation

## 2022-08-26 DIAGNOSIS — I5032 Chronic diastolic (congestive) heart failure: Secondary | ICD-10-CM | POA: Insufficient documentation

## 2022-08-27 DIAGNOSIS — E10649 Type 1 diabetes mellitus with hypoglycemia without coma: Secondary | ICD-10-CM | POA: Diagnosis not present

## 2022-08-27 NOTE — Procedures (Signed)
    Patient Name: [name] [name], [name] [name] Date: 08/26/2022 Gender: Female D.O.B: [birth year] Age (years): 66 Referring Provider: Fransico Him MD, ABSM Height (inches): 62 Interpreting Physician: Fransico Him MD, ABSM Weight (lbs): 110 RPSGT: Baxter Flattery BMI: 20 MRN: [id number] Neck Size: 13.00  CLINICAL INFORMATION The patient is referred for a BiPAP titration to treat sleep apnea.  SLEEP STUDY TECHNIQUE As per the AASM Manual for the Scoring of Sleep and Associated Events v2.3 (April 2016) with a hypopnea requiring 4% desaturations.  The channels recorded and monitored were frontal, central and occipital EEG, electrooculogram (EOG), submentalis EMG (chin), nasal and oral airflow, thoracic and abdominal wall motion, anterior tibialis EMG, snore microphone, electrocardiogram, and pulse oximetry. Bilevel positive airway pressure (BPAP) was initiated at the beginning of the study and titrated to treat sleep-disordered breathing.  MEDICATIONS Medications self-administered by patient taken the night of the study : ALKA-SELTZER WITHOUT ASPIRIN, AMBIEN, apidra, brilinta, febuxostat, IMDUR, LEXAPRO, MAGNESIUM OXIDE, PEPCID, PRAVACHOL, REQUIP, TOPROL XL, vascepa, VIBRA-TABS, XALATAN, XANAX  RESPIRATORY PARAMETERS Optimal IPAP Pressure (cm): 16  AHI at Optimal Pressure (/hr) 0 Optimal EPAP Pressure (cm):12  Overall Minimal O2 (%):93.0  Minimal O2 at Optimal Pressure (%): 93.0  SLEEP ARCHITECTURE Start Time:11:14:41 PM  Stop Time:5:13:56 AM  Total Time (min):359.2  Total Sleep Time (min):350.6 Sleep Latency (min):2.1  Sleep Efficiency (%):97.6%  REM Latency (min):185.5  WASO (min):6.5 Stage N1 (%):1.0% Stage N2 (%):95.6%  Stage N3 (%):0.0%  Stage R (%):3.4 Supine (%):55.08  Arousal Index (/hr):0.5  CARDIAC DATA The 2 lead EKG demonstrated sinus rhythm, pacemaker generated. The mean heart rate was 70.1 beats per minute. Other EKG findings include: None.  LEG MOVEMENT DATA The  total Periodic Limb Movements of Sleep (PLMS) were 0. The PLMS index was 0.0. A PLMS index of <15 is considered normal in adults.  IMPRESSIONS - An optimal PAP pressure was selected for this patient ( 16/12 cm of water) - Moderate Central Sleep Apnea was noted during this titration (CAI = 24/h). - Significant oxygen desaturations were not observed during this titration (min O2 = 93.0%). - No snoring was audible during this study. - No cardiac abnormalities were observed during this study. - Clinically significant periodic limb movements were not noted during this study. Arousals associated with PLMs were rare.  DIAGNOSIS - Obstructive Sleep Apnea (G47.33)  RECOMMENDATIONS - Trial of ResMed BiPAP therapy on 16/12 cm H2O with a Small-Medium size Fisher&Paykel Full Face Evora Full mask and heated humidification. - Avoid alcohol, sedatives and other CNS depressants that may worsen sleep apnea and disrupt normal sleep architecture. - Sleep hygiene should be reviewed to assess factors that may improve sleep quality. - Weight management and regular exercise should be initiated or continued. - Return to Sleep Center for re-evaluation after 4 weeks of therapy  [Electronically signed] 08/27/2022 02:32 PM  Fransico Him MD, ABSM Diplomate, American Board of Sleep Medicine

## 2022-08-29 ENCOUNTER — Telehealth: Payer: Self-pay | Admitting: *Deleted

## 2022-08-29 DIAGNOSIS — G2581 Restless legs syndrome: Secondary | ICD-10-CM

## 2022-08-29 DIAGNOSIS — G4733 Obstructive sleep apnea (adult) (pediatric): Secondary | ICD-10-CM

## 2022-08-29 NOTE — Telephone Encounter (Signed)
The patient has been notified of the result and verbalized understanding.  All questions (if any) were answered. Marolyn Hammock, [place] 08/29/2022 4:40 PM    -I think we need to start over and get an in lab PSG to determine if she has mainly central sleep apnea   Just pressure change for the patient.

## 2022-08-29 NOTE — Telephone Encounter (Signed)
-----   Message from Lauralee Evener, [place] sent at 08/28/2022 12:25 PM EST -----  ----- Message ----- From: Sueanne Margarita, MD Sent: 08/27/2022   2:34 PM EST To: [facility]v [facility] Sleep Studies  Please let patient know that they had a successful PAP titration and let DME know that orders are in EPIC.  Please set up 6 week OV with me.

## 2022-08-30 ENCOUNTER — Telehealth (HOSPITAL_COMMUNITY): Payer: Self-pay

## 2022-08-30 ENCOUNTER — Other Ambulatory Visit: Payer: Medicare PPO

## 2022-08-30 NOTE — Telephone Encounter (Signed)
We went through a peripheral vein so unlikely to be worrisome problems.  She may have some phlebitis at the site, would use warm compresses.

## 2022-08-30 NOTE — Telephone Encounter (Signed)
Patient reports that since having her heart cath on 08/21/22 she has developed a knot on her arm with some discoloration. She also reports that her arm is exteremly sore to the point that she can barely pick up things with that arm. She wants to know if this is normal. Please advise.

## 2022-08-30 NOTE — Telephone Encounter (Signed)
Patient advised and verbalized understanding 

## 2022-08-31 ENCOUNTER — Ambulatory Visit (HOSPITAL_COMMUNITY)
Admission: RE | Admit: 2022-08-31 | Discharge: 2022-08-31 | Disposition: A | Discharge status: Home / Self Care | Payer: Medicare PPO | Source: Ambulatory Visit | Attending: Cardiology | Admitting: Cardiology

## 2022-08-31 DIAGNOSIS — C50912 Malignant neoplasm of unspecified site of left female breast: Secondary | ICD-10-CM | POA: Diagnosis not present

## 2022-08-31 DIAGNOSIS — I5032 Chronic diastolic (congestive) heart failure: Secondary | ICD-10-CM | POA: Diagnosis not present

## 2022-08-31 LAB — BASIC METABOLIC PANEL
Anion gap: 8 (ref 5–15)
BUN: 54 mg/dL — ABNORMAL HIGH (ref 8–23)
CO2: 25 mmol/L (ref 22–32)
Calcium: 10.1 mg/dL (ref 8.9–10.3)
Chloride: 101 mmol/L (ref 98–111)
Creatinine, Ser: 2.11 mg/dL — ABNORMAL HIGH (ref 0.44–1.00)
GFR, Estimated: 25 mL/min — ABNORMAL LOW (ref >60)
Glucose, Bld: 105 mg/dL — ABNORMAL HIGH (ref 70–99)
Potassium: 4.8 mmol/L (ref 3.5–5.1)
Sodium: 134 mmol/L — ABNORMAL LOW (ref 135–145)

## 2022-09-03 DIAGNOSIS — E10649 Type 1 diabetes mellitus with hypoglycemia without coma: Secondary | ICD-10-CM | POA: Diagnosis not present

## 2022-09-07 DIAGNOSIS — G4733 Obstructive sleep apnea (adult) (pediatric): Secondary | ICD-10-CM | POA: Diagnosis not present

## 2022-09-12 DIAGNOSIS — L218 Other seborrheic dermatitis: Secondary | ICD-10-CM | POA: Diagnosis not present

## 2022-09-12 DIAGNOSIS — Z1283 Encounter for screening for malignant neoplasm of skin: Secondary | ICD-10-CM | POA: Diagnosis not present

## 2022-09-12 DIAGNOSIS — D225 Melanocytic nevi of trunk: Secondary | ICD-10-CM | POA: Diagnosis not present

## 2022-09-17 ENCOUNTER — Ambulatory Visit (HOSPITAL_COMMUNITY)
Admission: RE | Admit: 2022-09-17 | Discharge: 2022-09-17 | Disposition: A | Discharge status: Home / Self Care | Payer: Medicare PPO | Source: Ambulatory Visit | Attending: Urology | Admitting: Urology

## 2022-09-17 DIAGNOSIS — N281 Cyst of kidney, acquired: Secondary | ICD-10-CM | POA: Insufficient documentation

## 2022-09-17 DIAGNOSIS — Z85528 Personal history of other malignant neoplasm of kidney: Secondary | ICD-10-CM | POA: Insufficient documentation

## 2022-09-18 DIAGNOSIS — K760 Fatty (change of) liver, not elsewhere classified: Secondary | ICD-10-CM | POA: Diagnosis not present

## 2022-09-18 DIAGNOSIS — K7400 Hepatic fibrosis, unspecified: Secondary | ICD-10-CM | POA: Diagnosis not present

## 2022-09-18 DIAGNOSIS — K7460 Unspecified cirrhosis of liver: Secondary | ICD-10-CM | POA: Diagnosis not present

## 2022-09-18 DIAGNOSIS — E43 Unspecified severe protein-calorie malnutrition: Secondary | ICD-10-CM | POA: Diagnosis not present

## 2022-09-18 DIAGNOSIS — R748 Abnormal levels of other serum enzymes: Secondary | ICD-10-CM | POA: Diagnosis not present

## 2022-09-18 DIAGNOSIS — K7581 Nonalcoholic steatohepatitis (NASH): Secondary | ICD-10-CM | POA: Diagnosis not present

## 2022-09-18 DIAGNOSIS — L299 Pruritus, unspecified: Secondary | ICD-10-CM | POA: Diagnosis not present

## 2022-09-19 DIAGNOSIS — E10649 Type 1 diabetes mellitus with hypoglycemia without coma: Secondary | ICD-10-CM | POA: Diagnosis not present

## 2022-09-24 DIAGNOSIS — Z85528 Personal history of other malignant neoplasm of kidney: Secondary | ICD-10-CM | POA: Diagnosis not present

## 2022-09-24 DIAGNOSIS — N281 Cyst of kidney, acquired: Secondary | ICD-10-CM | POA: Diagnosis not present

## 2022-09-24 DIAGNOSIS — C641 Malignant neoplasm of right kidney, except renal pelvis: Secondary | ICD-10-CM | POA: Diagnosis not present

## 2022-09-26 DIAGNOSIS — E10649 Type 1 diabetes mellitus with hypoglycemia without coma: Secondary | ICD-10-CM | POA: Diagnosis not present

## 2022-10-01 ENCOUNTER — Other Ambulatory Visit (HOSPITAL_COMMUNITY): Payer: Self-pay

## 2022-10-01 DIAGNOSIS — E785 Hyperlipidemia, unspecified: Secondary | ICD-10-CM | POA: Diagnosis not present

## 2022-10-01 DIAGNOSIS — Z9641 Presence of insulin pump (external) (internal): Secondary | ICD-10-CM | POA: Diagnosis not present

## 2022-10-01 DIAGNOSIS — E1021 Type 1 diabetes mellitus with diabetic nephropathy: Secondary | ICD-10-CM | POA: Diagnosis not present

## 2022-10-01 MED ORDER — NITROGLYCERIN 0.4 MG/SPRAY TL SOLN: 0 refills | Status: DC

## 2022-10-02 ENCOUNTER — Ambulatory Visit (INDEPENDENT_AMBULATORY_CARE_PROVIDER_SITE_OTHER): Payer: Medicare PPO

## 2022-10-02 DIAGNOSIS — I495 Sick sinus syndrome: Secondary | ICD-10-CM

## 2022-10-02 DIAGNOSIS — E10649 Type 1 diabetes mellitus with hypoglycemia without coma: Secondary | ICD-10-CM | POA: Diagnosis not present

## 2022-10-04 DIAGNOSIS — L258 Unspecified contact dermatitis due to other agents: Secondary | ICD-10-CM | POA: Diagnosis not present

## 2022-10-04 LAB — CUP PACEART REMOTE DEVICE CHECK
Battery Impedance: 1265 Ohm
Battery Remaining Longevity: 60 mo
Battery Voltage: 2.79 V
Brady Statistic AP VP Percent: 0 %
Brady Statistic AP VS Percent: 1 %
Brady Statistic AS VP Percent: 0 %
Brady Statistic AS VS Percent: 99 %
Date Time Interrogation Session: 20240314095005
Implantable Lead Connection Status: 753985
Implantable Lead Connection Status: 753985
Implantable Lead Implant Date: 20140130
Implantable Lead Implant Date: 20140130
Implantable Lead Location: 753859
Implantable Lead Location: 753860
Implantable Lead Model: 5076
Implantable Lead Model: 5076
Implantable Pulse Generator Implant Date: 20140130
Lead Channel Impedance Value: 442 Ohm
Lead Channel Impedance Value: 594 Ohm
Lead Channel Pacing Threshold Amplitude: 0.5 V
Lead Channel Pacing Threshold Amplitude: 0.625 V
Lead Channel Pacing Threshold Pulse Width: 0.4 ms
Lead Channel Pacing Threshold Pulse Width: 0.4 ms
Lead Channel Setting Pacing Amplitude: 1.5 V
Lead Channel Setting Pacing Amplitude: 2.5 V
Lead Channel Setting Pacing Pulse Width: 0.4 ms
Lead Channel Setting Sensing Sensitivity: 5.6 mV
Zone Setting Status: 755011
Zone Setting Status: 755011

## 2022-10-06 ENCOUNTER — Other Ambulatory Visit (HOSPITAL_COMMUNITY): Payer: Self-pay | Admitting: Cardiology

## 2022-10-11 ENCOUNTER — Encounter: Payer: Self-pay | Admitting: Cardiology

## 2022-10-15 DIAGNOSIS — M810 Age-related osteoporosis without current pathological fracture: Secondary | ICD-10-CM | POA: Diagnosis not present

## 2022-10-19 ENCOUNTER — Telehealth: Payer: Self-pay

## 2022-10-19 DIAGNOSIS — G4733 Obstructive sleep apnea (adult) (pediatric): Secondary | ICD-10-CM

## 2022-10-19 NOTE — Telephone Encounter (Signed)
Called patient back to discuss her concerns about bipap and Dr. Theodosia Blender recommendation that she see a pulmonologist. Patient verbalizes understanding and agrees to plan. Referral placed to Dr. Elsworth Soho.

## 2022-10-26 ENCOUNTER — Ambulatory Visit: Payer: Medicare PPO | Admitting: Pulmonary Disease

## 2022-10-26 ENCOUNTER — Encounter: Payer: Self-pay | Admitting: Pulmonary Disease

## 2022-10-26 VITALS — BP 116/54 | HR 79 | Ht 62.0 in | Wt 118.8 lb

## 2022-10-26 DIAGNOSIS — E10649 Type 1 diabetes mellitus with hypoglycemia without coma: Secondary | ICD-10-CM | POA: Diagnosis not present

## 2022-10-26 DIAGNOSIS — I5032 Chronic diastolic (congestive) heart failure: Secondary | ICD-10-CM | POA: Diagnosis not present

## 2022-10-26 DIAGNOSIS — G4733 Obstructive sleep apnea (adult) (pediatric): Secondary | ICD-10-CM | POA: Diagnosis not present

## 2022-10-26 NOTE — Patient Instructions (Addendum)
X Rx for auto ASV machine to [facility] +3 - 15 EPAP 5

## 2022-10-26 NOTE — Assessment & Plan Note (Signed)
No signs of overt heart failure though she does feel like she is fluid overloaded.  She is maintained on diuretics and weight appears stable

## 2022-10-26 NOTE — Assessment & Plan Note (Signed)
The nature of her sleep disordered breathing seems to have changed over time.  She mostly had obstructive apneas on her baseline study in 2018 but study in 2024 shows mainly central apneas and Cheyne-Stokes breathing.  This may relate to worsening heart failure over time.  Seems like initially she required CPAP and surprisingly on a titration in 2023 she required low levels of BiPAP but currently for treatment of central apneas she needs either BiPAP ST mode or ASV mode of ventilation.  Fortunately her EF is maintained so ASV is not contraindicated. She has a BiPAP machine that is already paid for this is maintained at 16/12 and show significant residual central and obstructive apneas.  We can try ST mode on the same machine but I feel it may be best to convert her to ASV mode since her disease seems to change with time we can keep her on auto ASV settings with pressure support ranging from 3 to 15 cm and keep a minimum EPAP of 5 which would hopefully be enough for obstructive events.  If residual obstructive events are noted on download we can always increase the EPAP setting.  We will go ahead and provide her with such a machine and send a prescription to DME

## 2022-10-26 NOTE — Progress Notes (Signed)
Subjective:    Patient ID: [name] [name], female    DOB: [birth year], 72 y.o.   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 OSA was diagnosed in 2018 and she was initially maintained on CPAP and changed to bilevel slightly more than a year ago.  All her serial sleep studies have been reviewed. She is requiring increasing pressure on the BiPAP and she has difficulty tolerating.  She wakes up in the morning feeling sore and wonders if her lungs can withstand high pressure.  On BiPAP download, excessive number of residual events are noted.    PMH: CAD s/p CABG in 12/05 and redo in 1/11, bioprosthetic AVR,  diastolic CHF, and  sick sinus syndrome s/p PPM  -chronic iron deficiency anemia thought to be due to a slow GI bleed.  Last EGD was unremarkable and a capsule endoscopy was also unremarkable.  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 Bedtime is around 10:30 PM, sleep latency minimal, she sleeps on average, reports 4-5 awakenings almost every 2 hours but is able to go back to sleep, wakes up latest by 10 AM feeling tired without dryness of mouth or headaches. Weight is decreased by 8 pounds but fluctuated between 106 and 110 for the most part There is no history suggestive of cataplexy, sleep paralysis or parasomnias   Last cardiology/CHF consultation has been reviewed.  Current BiPAP settings are 16/12 with a fullface mask and review of download shows residual events 63/hour which are scored as obstructive 29/hour and central  34/hour  Significant tests/ events reviewed 07/2022 NPSG >> AHI 63/h, CAI 50/h 08/2022 Bipap Titration study >> 110 lbs -bipap ST16/12 with back up RR 14, central apneas eliminated at his pressure, small FF mask  05/2022 BiPAP titration>> 9/5 cm  05/2019 BiPAP titration 20/16 cm  12/2016 CPAP titration 18 cm 10/2016 NPSG >> AHI 22/hour predominantly obstructive events  Past Medical History:  Diagnosis Date   Anxiety    Aortic stenosis    a. bicuspid aortic valve; mean gradient of 20 mmHg in 2/10; b. s/p bioprosth AVR (19-mm Edwards pericardial valve) with a redo coronary artery bypass graft procedure in 07/2009; postoperative Dressler's syndrome     Arthritis    Breast cancer (HCC)    Carcinoma, renal cell (HCC) 05/2007   Laparoscopic right nephrectomy   Chronic diastolic CHF (congestive heart failure) (HCC)    a. 9.2014 EchoP EF 60-65%, no rwma, bioprosth AVR, mean gradient of , mild to mod MR, PASP .   CKD (chronic kidney disease), stage IV (HCC)    Colitis, ischemic (HCC) 2008   Coronary artery disease    a. initial CABG with SVG-RCA in 12/05. b. redo SVG-RCA and bioprosthetic AVR in 1/11. d. 9/14 - PCI with DES to distal LM/proximal LAD was done rather than bypass as she would have been a poor candidate for redo sternotomy. d. S/p DES to prox LM 05/2014.   Diabetes mellitus 03/2010    TYPE II: Hemoglobin A1c of 7.4 in  03/2010; 8.4 in 06/2010; treated with insulin pump   Duodenal  ulcer    Remote; H. pylori positive   Gastroparesis due to DM (HCC) 05/01/2012   Glaucoma 1998   Hodgkin's disease (HCC) 1991   Mantle radiation therapy   Hyperlipidemia    Hyperplastic gastric polyp 06/23/2012   Hypertension    Iron deficiency anemia    a. 03/2013 EGD: essentially normal. Possible slow GIB.   Migraines    NASH (nonalcoholic steatohepatitis) 1999   -biopsy in 1999   OSA treated with BiPAP    Osteoporosis    Pacemaker    six sinus syndrome   PAF (paroxysmal atrial  fibrillation) (HCC)    a.  h/o brief episodes noted on ppm interrogation. b. given GI bleeding and need to be on both ASA 81 and Brilinta, risks likely outweigh benefits of anticoagulation.    Small bowel obstruction (HCC)    Recurrent; resolved after resection of a lipoma   Syncope    a. H/o recurrent syncope with pauses on loop recorder s/p Medtronic pacemaker 07/2012.   Past Surgical History:  Procedure Laterality Date   ABDOMINAL HYSTERECTOMY  2000   AORTIC VALVE REPLACEMENT  2011   Aortic valve replacement surgery with a 19 mm bioprosthetic device post redo CABG January 2011.   BONE MARROW BIOPSY     BREAST EXCISIONAL BIOPSY     benign, bilateral   CARDIAC CATHETERIZATION     CERVICAL BIOPSY     COLONOSCOPY     Multiple with adenomatous polyps   COLONOSCOPY  06/23/2012   Procedure: COLONOSCOPY;  Surgeon: Iva Boop, MD;  Location: [facility] ENDOSCOPY;  Service: Endoscopy;  Laterality: N/A;   CORONARY ARTERY BYPASS GRAFT  2005, 2011   CABG-most recent SVG to RCA-12/05; RCA occluded with patent graft in 2006; Redo bypass in 07/2009   ESOPHAGOGASTRODUODENOSCOPY     ESOPHAGOGASTRODUODENOSCOPY  06/23/2012   Procedure: ESOPHAGOGASTRODUODENOSCOPY (EGD);  Surgeon: Iva Boop, MD;  Location: [name];  Service: Endoscopy;  Laterality: N/A;   ESOPHAGOGASTRODUODENOSCOPY N/A 04/06/2013   Procedure: ESOPHAGOGASTRODUODENOSCOPY (EGD);  Surgeon: Hilarie Fredrickson, MD;  Location: [facility] ENDOSCOPY;  Service: Endoscopy;  Laterality: N/A;   EXPLORATORY LAPAROTOMY W/ BOWEL RESECTION     Small bowel resection of lipoma   GIVENS CAPSULE STUDY N/A 04/30/2013   Procedure: GIVENS CAPSULE STUDY;  Surgeon: Iva Boop, MD;  Location: [facility] ENDOSCOPY;  Service: Endoscopy;  Laterality: N/A;   LEFT AND RIGHT HEART CATHETERIZATION WITH CORONARY ANGIOGRAM N/A 04/07/2013   Procedure: LEFT AND RIGHT HEART CATHETERIZATION WITH CORONARY ANGIOGRAM;  Surgeon: Laurey Morale, MD;  Location: [facility] CATH LAB;  Service:  Cardiovascular;  Laterality: N/A;   LEFT ATRIAL APPENDAGE OCCLUSION N/A 02/16/2016   Watchman not placed - nickel allergy   LEFT HEART CATHETERIZATION WITH CORONARY/GRAFT ANGIOGRAM N/A 06/07/2014   Procedure: LEFT HEART CATHETERIZATION WITH Isabel Caprice;  Surgeon: Kathleene Hazel, MD;  Location: [facility] CATH LAB;  Service: Cardiovascular;  Laterality: N/A;   LIVER BIOPSY     LOOP RECORDER IMPLANT N/A 12/10/2011   Procedure: LOOP RECORDER IMPLANT;  Surgeon: Marinus Maw, MD;  Location: [facility] CATH LAB;  Service: Cardiovascular;  Laterality: N/A;   MALONEY DILATION  06/23/2012   Procedure: Elease Hashimoto DILATION;  Surgeon: Iva Boop, MD;  Location: [facility] ENDOSCOPY;  Service: Endoscopy;;  54 fr   MASTECTOMY Left 05/17/2015   total    NEPHRECTOMY  2008   Laparoscopic right nephrectomy, renal cell carcinoma   PACEMAKER INSERTION  08/21/2012   Medtronic Adapta L dual-chamber pacemaker   PERCUTANEOUS  CORONARY STENT INTERVENTION (PCI-S) N/A 04/09/2013   Procedure: PERCUTANEOUS CORONARY STENT INTERVENTION (PCI-S);  Surgeon: Kathleene Hazelhristopher D McAlhany, MD;  [facility];  Service: Cardiovascular;  Laterality: N/A;   PERMANENT PACEMAKER INSERTION N/A 08/21/2012   Procedure: PERMANENT PACEMAKER INSERTION;  Surgeon: Marinus MawGregg W Taylor, MD;  [facility];  Service: Cardiovascular;  Laterality: N/A;   RIGHT HEART CATH N/A 03/18/2019   Procedure: RIGHT HEART CATH;  Surgeon: Laurey MoraleMcLean, [name] S, MD;  [facility];  Service: Cardiovascular;  Laterality: N/A;   RIGHT HEART CATH N/A 08/21/2022   Procedure: RIGHT HEART CATH;  Surgeon: Laurey MoraleMcLean, [name] S, MD;  [facility];  Service: Cardiovascular;  Laterality: N/A;   RIGHT HEART CATH AND CORONARY/GRAFT ANGIOGRAPHY N/A 04/19/2017   Procedure: RIGHT HEART CATH AND CORONARY/GRAFT ANGIOGRAPHY;  Surgeon: Laurey MoraleMcLean, [name] S, MD;  [facility];  Service: Cardiovascular;  Laterality: N/A;   TEE WITHOUT  CARDIOVERSION N/A 12/07/2015   Procedure: TRANSESOPHAGEAL ECHOCARDIOGRAM (TEE);  Surgeon: Laurey Moralealton S McLean, MD;  [facility];  Service: Cardiovascular;  Laterality: N/A;   TOTAL ABDOMINAL HYSTERECTOMY W/ BILATERAL SALPINGOOPHORECTOMY  2000   endometriosis and ovarian cysts   TOTAL MASTECTOMY Left 05/17/2015   Procedure: LEFT TOTAL MASTECTOMY;  Surgeon: Emelia LoronMatthew Wakefield, MD;  Location: [facility] OR;  Service: General;  Laterality: Left;   TUMOR EXCISION  1981   removal of Hodgkins lymphoma    Allergies  Allergen Reactions   Avandia [Rosiglitazone Maleate] Other (See Comments)    Congestive heart failure   Cephalexin Anaphylaxis, Swelling and Rash    Extremities swell , throat swelling    Clindamycin Anaphylaxis and Shortness Of Breath    Other reaction(s): esophageal spasms   Humalog [Insulin Lispro] Itching     big knots and whelp    Lincomycin Anaphylaxis and Shortness Of Breath   Metformin Other (See Comments)    Congestive heart failure   Novolog [Insulin Aspart] Hives and Itching    Humalog & Novolog big knots and whelp     Penicillins Anaphylaxis, Hives, Swelling and Rash    Did it involve swelling of the face/tongue/throat, SOB, or low BP? Yes Did it involve sudden or severe rash/hives, skin peeling, or any reaction on the inside of your mouth or nose? No Did you need to seek medical attention at a hospital or doctor's office? Yes When did it last happen?      Childhood allergy  If all above answers are "NO", may proceed with cephalosporin use.    Tizanidine Other (See Comments)    Dizziness, Mental Status Changes, Hallucination     Adhesive [Tape] Other (See Comments)    Tears skin, Please use "paper" tape   Atorvastatin Other (See Comments)    increased LFT's, no muscle weakness, leg pain Muscle and joint pain increased LFT's  Increased LFTs   Cholestyramine Other (See Comments)    Liver Disorder Elevated LFTs     Gentamycin [Gentamicin] Hives, Itching  and Rash    Fine red bumps.  Reaction noted post loop recorder implant, where gentamycin was used for irrigation. Topical prep   Iodinated Contrast Media Other (See Comments)    PAIN DURING LYMPHANGIOGRAM '81, NO ALLERGY TO IV CONTRAST.  Has had procedures with Iodine since without problems.    Limonene Hives, Itching, Rash and Other (See Comments)    Reaction noted post loop recorder implant, where gentamycin was used for irrigation. Topical prep   Prednisone Itching and Other (  See Comments)    B/P went high, itching all over.  Tolerates with benadryl    Ranexa [Ranolazine] Other (See Comments)    Nausea, dizziness, low blood sugar, tingling in right hand, "blood blisters" on tongue    Rosuvastatin Nausea And Vomiting, Nausea Only and Other (See Comments)    Muscle and joint pain & increased LFTs; tolerates Pravastatin 10mg  (max) Other reaction(s): elevated LFTs   Simvastatin Other (See Comments)    Increased LFT's    Azelastine Other (See Comments)    Other reaction(s): metallic taste,spitting up blood   Clopidogrel Other (See Comments)    Does not work for patient Medicine was not effective Other reaction(s): Unknown   Kerendia [Finerenone] Hives and Nausea Only   Lisinopril Diarrhea, Nausea Only and Other (See Comments)   Codeine Nausea And Vomiting   Erythromycin Nausea And Vomiting   Hydrocodone-Acetaminophen Itching, Rash and Other (See Comments)    itching   Latex Itching and Other (See Comments)    I"if wear gloves hands get red ,itchy no prioblem if other wear and touch me"   Neomycin Rash and Other (See Comments)    Redness   Nickel Itching   Ranitidine Nausea Only   Tamiflu [Oseltamivir] Nausea And Vomiting   Tramadol Nausea Only    Social History   Socioeconomic History   Marital status: Married    Spouse name: Not on file   Number of children: 2   Years of education: Not on file   Highest education level: Doctorate  Occupational History   Occupation:  retired    Comment: elementary principal  Tobacco Use   Smoking status: Never   Smokeless tobacco: Never  Vaping Use   Vaping Use: Never used  Substance and Sexual Activity   Alcohol use: No    Comment: social, 54s   Drug use: Never   Sexual activity: Yes  Other Topics Concern   Not on file  Social History Narrative   Retired Programmer, multimedia principal, married    Started disability September 2013      Has one daughter in [place], works at [employer]. Has a son, Arlys John, who is Press photographer.   Eats all food groups.   Wear seatbelt.    Attends church. [place], [place]. [employer].    Social Determinants of Health   Financial Resource Strain: Not on file  Food Insecurity: Not on file  Transportation Needs: Not on file  Physical Activity: Not on file  Stress: Not on file  Social Connections: Not on file  Intimate Partner Violence: Not on file    Family History  Problem Relation Age of Onset   Heart attack Mother        d.61   Heart disease Mother    Anal fissures Mother    Heart attack Father        d.60   Diabetes Father    Heart disease Father    Lung cancer Sister        d.61 metastases to brain. History of smoking. Maternal half-sister.   Cancer Sister 30       unspecified gynecologic cancer (either cervical or ovarian) in her 30s.   Breast cancer Sister 19       d.42 from metastatic disease. Paternal half-sister.   Hypertension Brother    Glaucoma Brother    Parkinson's disease Paternal Aunt    Heart attack Maternal Grandmother    Cancer Maternal Grandfather 50       d.52s oral  cancer   Colon cancer Neg Hx    Stomach cancer Neg Hx    Parkinsonism Neg Hx      Review of Systems  Shortness of breath with activity and at rest Cough productive of pink to brown sputum Irregular heart.  Heartburn Weight gain due to fluid Tooth problems Occasional headaches Sneezing and itching Anxiety on Xanax Joint stiffness in her knees She does get feet swelling but  mostly distention of her abdomen.     Objective:   Physical Exam  Gen. Pleasant, petite woman, in no distress, normal affect ENT - no pallor,icterus, no post nasal drip Neck: No JVD, no thyromegaly, no carotid bruits Lungs: no use of accessory muscles, no dullness to percussion, clear without rales or rhonchi  Cardiovascular: Rhythm regular, ESM 2/6 at base no  gallops, no peripheral edema Abdomen: soft and non-tender, no hepatosplenomegaly, BS normal. Musculoskeletal: No deformities, no cyanosis or clubbing Neuro:  alert, non focal       Assessment & Plan:

## 2022-10-31 DIAGNOSIS — R748 Abnormal levels of other serum enzymes: Secondary | ICD-10-CM | POA: Diagnosis not present

## 2022-10-31 DIAGNOSIS — K760 Fatty (change of) liver, not elsewhere classified: Secondary | ICD-10-CM | POA: Diagnosis not present

## 2022-10-31 DIAGNOSIS — K7400 Hepatic fibrosis, unspecified: Secondary | ICD-10-CM | POA: Diagnosis not present

## 2022-11-02 ENCOUNTER — Telehealth: Payer: Self-pay | Admitting: Pulmonary Disease

## 2022-11-02 DIAGNOSIS — G4733 Obstructive sleep apnea (adult) (pediatric): Secondary | ICD-10-CM

## 2022-11-02 DIAGNOSIS — E10649 Type 1 diabetes mellitus with hypoglycemia without coma: Secondary | ICD-10-CM | POA: Diagnosis not present

## 2022-11-02 NOTE — Telephone Encounter (Signed)
Patient checking on order for auto asv machine. Company is [employer]. Patient phone number is [phone].

## 2022-11-08 NOTE — Telephone Encounter (Signed)
Looked at pt's last OV and I do not see where any orders were placed during that visit. Dr. Vassie Loll, please advise if an order was supposed to have been placed and if so, please advise on this?

## 2022-11-12 ENCOUNTER — Inpatient Hospital Stay: Payer: Medicare PPO | Attending: Hematology

## 2022-11-12 DIAGNOSIS — D696 Thrombocytopenia, unspecified: Secondary | ICD-10-CM | POA: Diagnosis not present

## 2022-11-12 DIAGNOSIS — Z905 Acquired absence of kidney: Secondary | ICD-10-CM | POA: Insufficient documentation

## 2022-11-12 DIAGNOSIS — Z86000 Personal history of in-situ neoplasm of breast: Secondary | ICD-10-CM | POA: Insufficient documentation

## 2022-11-12 DIAGNOSIS — Z9012 Acquired absence of left breast and nipple: Secondary | ICD-10-CM | POA: Diagnosis not present

## 2022-11-12 DIAGNOSIS — C642 Malignant neoplasm of left kidney, except renal pelvis: Secondary | ICD-10-CM | POA: Diagnosis not present

## 2022-11-12 LAB — COMPREHENSIVE METABOLIC PANEL
ALT: 24 U/L (ref 0–44)
AST: 26 U/L (ref 15–41)
Albumin: 4.5 g/dL (ref 3.5–5.0)
Alkaline Phosphatase: 106 U/L (ref 38–126)
Anion gap: 11 (ref 5–15)
BUN: 72 mg/dL — ABNORMAL HIGH (ref 8–23)
CO2: 26 mmol/L (ref 22–32)
Calcium: 10.2 mg/dL (ref 8.9–10.3)
Chloride: 100 mmol/L (ref 98–111)
Creatinine, Ser: 2.6 mg/dL — ABNORMAL HIGH (ref 0.44–1.00)
GFR, Estimated: 19 mL/min — ABNORMAL LOW (ref >60)
Glucose, Bld: 359 mg/dL — ABNORMAL HIGH (ref 70–99)
Potassium: 4.6 mmol/L (ref 3.5–5.1)
Sodium: 137 mmol/L (ref 135–145)
Total Bilirubin: 0.8 mg/dL (ref 0.3–1.2)
Total Protein: 8.9 g/dL — ABNORMAL HIGH (ref 6.5–8.1)

## 2022-11-12 LAB — CBC WITH DIFFERENTIAL/PLATELET
Abs Immature Granulocytes: 0.07 10*3/uL (ref 0.00–0.07)
Basophils Absolute: 0.1 10*3/uL (ref 0.0–0.1)
Basophils Relative: 1 %
Eosinophils Absolute: 0.2 10*3/uL (ref 0.0–0.5)
Eosinophils Relative: 2 %
HCT: 38.5 % (ref 36.0–46.0)
Hemoglobin: 12.5 g/dL (ref 12.0–15.0)
Immature Granulocytes: 1 %
Lymphocytes Relative: 20 %
Lymphs Abs: 1.6 10*3/uL (ref 0.7–4.0)
MCH: 30 pg (ref 26.0–34.0)
MCHC: 32.5 g/dL (ref 30.0–36.0)
MCV: 92.5 fL (ref 80.0–100.0)
Monocytes Absolute: 0.5 10*3/uL (ref 0.1–1.0)
Monocytes Relative: 7 %
Neutro Abs: 5.6 10*3/uL (ref 1.7–7.7)
Neutrophils Relative %: 69 %
Platelets: 120 10*3/uL — ABNORMAL LOW (ref 150–400)
RBC: 4.16 MIL/uL (ref 3.87–5.11)
RDW: 13.3 % (ref 11.5–15.5)
WBC: 8 10*3/uL (ref 4.0–10.5)
nRBC: 0 % (ref 0.0–0.2)

## 2022-11-13 ENCOUNTER — Other Ambulatory Visit: Payer: Medicare PPO

## 2022-11-13 LAB — KAPPA/LAMBDA LIGHT CHAINS
Kappa free light chain: 89.3 mg/L — ABNORMAL HIGH (ref 3.3–19.4)
Kappa, lambda light chain ratio: 1.44 (ref 0.26–1.65)
Lambda free light chains: 61.9 mg/L — ABNORMAL HIGH (ref 5.7–26.3)

## 2022-11-13 NOTE — Progress Notes (Signed)
Remote pacemaker transmission.   

## 2022-11-15 DIAGNOSIS — I5032 Chronic diastolic (congestive) heart failure: Secondary | ICD-10-CM | POA: Diagnosis not present

## 2022-11-15 DIAGNOSIS — E1022 Type 1 diabetes mellitus with diabetic chronic kidney disease: Secondary | ICD-10-CM | POA: Diagnosis not present

## 2022-11-15 DIAGNOSIS — D696 Thrombocytopenia, unspecified: Secondary | ICD-10-CM | POA: Diagnosis not present

## 2022-11-15 DIAGNOSIS — I25119 Atherosclerotic heart disease of native coronary artery with unspecified angina pectoris: Secondary | ICD-10-CM | POA: Diagnosis not present

## 2022-11-15 DIAGNOSIS — I480 Paroxysmal atrial fibrillation: Secondary | ICD-10-CM | POA: Diagnosis not present

## 2022-11-15 DIAGNOSIS — I700 Atherosclerosis of aorta: Secondary | ICD-10-CM | POA: Diagnosis not present

## 2022-11-15 DIAGNOSIS — E261 Secondary hyperaldosteronism: Secondary | ICD-10-CM | POA: Diagnosis not present

## 2022-11-15 DIAGNOSIS — Z0000 Encounter for general adult medical examination without abnormal findings: Secondary | ICD-10-CM | POA: Diagnosis not present

## 2022-11-15 DIAGNOSIS — N185 Chronic kidney disease, stage 5: Secondary | ICD-10-CM | POA: Diagnosis not present

## 2022-11-15 LAB — PROTEIN ELECTROPHORESIS, SERUM
A/G Ratio: 1.1 (ref 0.7–1.7)
Albumin ELP: 4.2 g/dL (ref 2.9–4.4)
Alpha-1-Globulin: 0.2 g/dL (ref 0.0–0.4)
Alpha-2-Globulin: 0.9 g/dL (ref 0.4–1.0)
Beta Globulin: 1.1 g/dL (ref 0.7–1.3)
Gamma Globulin: 1.8 g/dL (ref 0.4–1.8)
Globulin, Total: 4 g/dL — ABNORMAL HIGH (ref 2.2–3.9)
Total Protein ELP: 8.2 g/dL (ref 6.0–8.5)

## 2022-11-16 LAB — IMMUNOFIXATION ELECTROPHORESIS
IgA: 362 mg/dL — ABNORMAL HIGH (ref 87–352)
IgG (Immunoglobin G), Serum: 1548 mg/dL (ref 586–1602)
IgM (Immunoglobulin M), Srm: 240 mg/dL — ABNORMAL HIGH (ref 26–217)
Total Protein ELP: 7.9 g/dL (ref 6.0–8.5)

## 2022-11-19 ENCOUNTER — Telehealth: Payer: Self-pay | Admitting: *Deleted

## 2022-11-19 ENCOUNTER — Encounter: Payer: Self-pay | Admitting: Pulmonary Disease

## 2022-11-19 DIAGNOSIS — G4731 Primary central sleep apnea: Secondary | ICD-10-CM | POA: Insufficient documentation

## 2022-11-19 NOTE — Telephone Encounter (Signed)
Called and spoke w/ [name] [name] @ [employer] - he verbalized that they are needing the dx code of complex or central sleep apnea.  Dr.Alva, could you add one of those diagnosis to the pts chart so I could replace the order for the ASV machine?

## 2022-11-19 NOTE — Progress Notes (Signed)
[facility] [address] [place] [address]    Clinic Day:  11/20/2022  Referring physician: Aliene Beams, MD  Patient Care Team: Aliene Beams, MD as PCP - General (Family Medicine) Marinus Maw, MD as PCP - Electrophysiology (Cardiology) Quintella Reichert, MD as PCP - Sleep Medicine (Sleep Medicine) Laurey Morale, MD as PCP - Advanced Heart Failure (Cardiology) Rothbart, Gerrit Friends, MD (Inactive) (Cardiology) Iva Boop, MD (Gastroenterology) Altheimer, Casimiro Needle, MD (Endocrinology) Heloise Purpura, MD (Urology) Reece Packer, MD (Hematology and Oncology) Nathen May, MD as Referring Physician (Endocrinology)   ASSESSMENT & PLAN:   Assessment: 1.  Mild thrombocytopenia: -CT of the abdomen did not show any splenomegaly.  Previous CT disorder work-up was negative. -Peripheral blood smear showed giant platelets.  Clinically consistent with immune mediated thrombocytopenia.   2.  Mild hypercalcemia: -Her calcium level for the last 3 times in our clinic was normal to upper limit of normal. -Previous vitamin D was 75.  Intact PTH was normal.  Nonparathyroid hypercalcemia.  Calcitriol was 16.5.  PTH RP was less than 2. -SPEP and immunofixation was negative.  Free light chain ratio was elevated.   3.  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 -Last mammogram on 08/04/2019 reviewed by me was BI-RADS Category 4 with 0.6 cm mixed echogenicity mass in the right breast at 9:00. -Aspiration of this 0.6 cm complex cyst was successful on 08/24/2019.  Aspiration of the 0.4 cm cyst in the right breast 9:00 was successful.  Routine annual mammogram was recommended.   4.  Stage IIb Hodgkin's disease: -She received mantle radiation in 1981.  Continues to be in remission since then.  Does not have any B symptoms.   5.  Chromophobe renal cell carcinoma: -Nephrectomy in 2008.  Last CT in September 2019 showed stable 14 mm  left renal lesion.    Plan: 1.  Mild thrombocytopenia: - She has mild thrombocytopenia consistently since 2016. - Latest CBC shows platelet count 120. - She is also on Brilinta and has occasional nosebleeds and easy bruising. - No further workup needed at this time.  RTC 6 months for follow-up with repeat labs.   2.  Mild hypercalcemia: - She has intermittent mild hypercalcemia.  Calcium level today is 10.2.  Albumin is 4.5. - Myeloma panel: M spike not observed.  Immunofixation was polyclonal.  Free light chain ratio is 1.44.  Orders Placed This Encounter  Procedures   Protein electrophoresis, serum    Standing Status:   Future    Standing Expiration Date:   11/20/2023    Order Specific Question:   Release to patient    Answer:   Immediate   Kappa/lambda light chains    Standing Status:   Future    Standing Expiration Date:   11/20/2023   Immunofixation electrophoresis    Standing Status:   Future    Standing Expiration Date:   11/20/2023    Order Specific Question:   Release to patient    Answer:   Immediate   CBC with Differential/Platelet    Standing Status:   Future    Standing Expiration Date:   11/20/2023    Order Specific Question:   Release to patient    Answer:   Immediate   Comprehensive metabolic panel    Standing Status:   Future    Standing Expiration Date:   11/20/2023    Order Specific Question:  Release to patient    Answer:   Immediate      I,Katie Daubenspeck,acting as a scribe for Doreatha Massed, MD.,have documented all relevant documentation on the behalf of Doreatha Massed, MD,as directed by  Doreatha Massed, MD while in the presence of Doreatha Massed, MD.   I, Doreatha Massed MD, have reviewed the above documentation for accuracy and completeness, and I agree with the above.   Doreatha Massed, MD   4/30/20244:54 PM  CHIEF COMPLAINT:   Diagnosis: thrombocytopenia    Cancer Staging  No matching staging information was  found for the patient.   Prior Therapy: none  Current Therapy:  surveillance   HISTORY OF PRESENT ILLNESS:   Oncology History   No history exists.     INTERVAL HISTORY:   Clovia is a 72 y.o. female presenting to clinic today for follow up of thrombocytopenia. She was last seen by me on 05/21/22.  Of note since her last visit, she underwent right heart catheterization on 08/21/22 under Dr. Shirlee Latch.  Today, she states that she is doing well overall. Her appetite level is at 70%. Her energy level is at 60%.  PAST MEDICAL HISTORY:   Past Medical History: Past Medical History:  Diagnosis Date   Anxiety    Aortic stenosis    a. bicuspid aortic valve; mean gradient of 20 mmHg in 2/10; b. s/p bioprosth AVR (19-mm Edwards pericardial valve) with a redo coronary artery bypass graft procedure in 07/2009; postoperative Dressler's syndrome     Arthritis    Breast cancer (HCC)    Carcinoma, renal cell (HCC) 05/2007   Laparoscopic right nephrectomy   Chronic diastolic CHF (congestive heart failure) (HCC)    a. 9.2014 EchoP EF 60-65%, no rwma, bioprosth AVR, mean gradient of , mild to mod MR, PASP .   CKD (chronic kidney disease), stage IV (HCC)    Colitis, ischemic (HCC) 2008   Coronary artery disease    a. initial CABG with SVG-RCA in 12/05. b. redo SVG-RCA and bioprosthetic AVR in 1/11. d. 9/14 - PCI with DES to distal LM/proximal LAD was done rather than bypass as she would have been a poor candidate for redo sternotomy. d. S/p DES to prox LM 05/2014.   Diabetes mellitus 03/2010    TYPE II: Hemoglobin A1c of 7.4 in  03/2010; 8.4 in 06/2010; treated with insulin pump   Duodenal ulcer    Remote; H. pylori positive   Gastroparesis due to DM (HCC) 05/01/2012   Glaucoma 1998   Hodgkin's disease (HCC) 1991   Mantle radiation therapy   Hyperlipidemia    Hyperplastic gastric polyp 06/23/2012   Hypertension    Iron deficiency anemia    a. 03/2013 EGD: essentially normal. Possible  slow GIB.   Migraines    NASH (nonalcoholic steatohepatitis) 1999   -biopsy in 1999   OSA treated with BiPAP    Osteoporosis    Pacemaker    six sinus syndrome   PAF (paroxysmal atrial fibrillation) (HCC)    a.  h/o brief episodes noted on ppm interrogation. b. given GI bleeding and need to be on both ASA 81 and Brilinta, risks likely outweigh benefits of anticoagulation.    Small bowel obstruction (HCC)    Recurrent; resolved after resection of a lipoma   Syncope    a. H/o recurrent syncope with pauses on loop recorder s/p Medtronic pacemaker 07/2012.    Surgical History: Past Surgical History:  Procedure Laterality Date   ABDOMINAL HYSTERECTOMY  2000  AORTIC VALVE REPLACEMENT  2011   Aortic valve replacement surgery with a 19 mm bioprosthetic device post redo CABG January 2011.   BONE MARROW BIOPSY     BREAST EXCISIONAL BIOPSY     benign, bilateral   CARDIAC CATHETERIZATION     CERVICAL BIOPSY     COLONOSCOPY     Multiple with adenomatous polyps   COLONOSCOPY  06/23/2012   Procedure: COLONOSCOPY;  Surgeon: Iva Boop, MD;  Location: [facility] ENDOSCOPY;  Service: Endoscopy;  Laterality: N/A;   CORONARY ARTERY BYPASS GRAFT  2005, 2011   CABG-most recent SVG to RCA-12/05; RCA occluded with patent graft in 2006; Redo bypass in 07/2009   ESOPHAGOGASTRODUODENOSCOPY     ESOPHAGOGASTRODUODENOSCOPY  06/23/2012   Procedure: ESOPHAGOGASTRODUODENOSCOPY (EGD);  Surgeon: Iva Boop, MD;  Location: [name] ENDOSCOPY;  Service: Endoscopy;  Laterality: N/A;   ESOPHAGOGASTRODUODENOSCOPY N/A 04/06/2013   Procedure: ESOPHAGOGASTRODUODENOSCOPY (EGD);  Surgeon: Hilarie Fredrickson, MD;  Location: [facility] ENDOSCOPY;  Service: Endoscopy;  Laterality: N/A;   EXPLORATORY LAPAROTOMY W/ BOWEL RESECTION     Small bowel resection of lipoma   GIVENS CAPSULE STUDY N/A 04/30/2013   Procedure: GIVENS CAPSULE STUDY;  Surgeon: Iva Boop, MD;  Location: [facility] ENDOSCOPY;  Service: Endoscopy;  Laterality: N/A;   LEFT AND  RIGHT HEART CATHETERIZATION WITH CORONARY ANGIOGRAM N/A 04/07/2013   Procedure: LEFT AND RIGHT HEART CATHETERIZATION WITH CORONARY ANGIOGRAM;  Surgeon: Laurey Morale, MD;  Location: [facility] CATH LAB;  Service: Cardiovascular;  Laterality: N/A;   LEFT ATRIAL APPENDAGE OCCLUSION N/A 02/16/2016   Watchman not placed - nickel allergy   LEFT HEART CATHETERIZATION WITH CORONARY/GRAFT ANGIOGRAM N/A 06/07/2014   Procedure: LEFT HEART CATHETERIZATION WITH Isabel Caprice;  Surgeon: Kathleene Hazel, MD;  Location: [facility] CATH LAB;  Service: Cardiovascular;  Laterality: N/A;   LIVER BIOPSY     LOOP RECORDER IMPLANT N/A 12/10/2011   Procedure: LOOP RECORDER IMPLANT;  Surgeon: Marinus Maw, MD;  Location: [facility] CATH LAB;  Service: Cardiovascular;  Laterality: N/A;   MALONEY DILATION  06/23/2012   Procedure: Elease Hashimoto DILATION;  Surgeon: Iva Boop, MD;  Location: [facility] ENDOSCOPY;  Service: Endoscopy;;  54 fr   MASTECTOMY Left 05/17/2015   total    NEPHRECTOMY  2008   Laparoscopic right nephrectomy, renal cell carcinoma   PACEMAKER INSERTION  08/21/2012   Medtronic Adapta L dual-chamber pacemaker   PERCUTANEOUS CORONARY STENT INTERVENTION (PCI-S) N/A 04/09/2013   Procedure: PERCUTANEOUS CORONARY STENT INTERVENTION (PCI-S);  Surgeon: Kathleene Hazel, MD;  Location: [facility] CATH LAB;  Service: Cardiovascular;  Laterality: N/A;   PERMANENT PACEMAKER INSERTION N/A 08/21/2012   Procedure: PERMANENT PACEMAKER INSERTION;  Surgeon: Marinus Maw, MD;  Location: [facility] CATH LAB;  Service: Cardiovascular;  Laterality: N/A;   RIGHT HEART CATH N/A 03/18/2019   Procedure: RIGHT HEART CATH;  Surgeon: Laurey Morale, MD;  Location: [facility] INVASIVE CV LAB;  Service: Cardiovascular;  Laterality: N/A;   RIGHT HEART CATH N/A 08/21/2022   Procedure: RIGHT HEART CATH;  Surgeon: Laurey Morale, MD;  Location: [facility] INVASIVE CV LAB;  Service: Cardiovascular;  Laterality: N/A;   RIGHT HEART CATH AND CORONARY/GRAFT ANGIOGRAPHY  N/A 04/19/2017   Procedure: RIGHT HEART CATH AND CORONARY/GRAFT ANGIOGRAPHY;  Surgeon: Laurey Morale, MD;  Location: [facility] INVASIVE CV LAB;  Service: Cardiovascular;  Laterality: N/A;   TEE WITHOUT CARDIOVERSION N/A 12/07/2015   Procedure: TRANSESOPHAGEAL ECHOCARDIOGRAM (TEE);  Surgeon: Laurey Morale, MD;  Location: [facility] ENDOSCOPY;  Service: Cardiovascular;  Laterality:  N/A;   TOTAL ABDOMINAL HYSTERECTOMY W/ BILATERAL SALPINGOOPHORECTOMY  2000   endometriosis and ovarian cysts   TOTAL MASTECTOMY Left 05/17/2015   Procedure: LEFT TOTAL MASTECTOMY;  Surgeon: Emelia Loron, MD;  Location: [facility] OR;  Service: General;  Laterality: Left;   TUMOR EXCISION  1981   removal of Hodgkins lymphoma    Social History: Social History   Socioeconomic History   Marital status: Married    Spouse name: Not on file   Number of children: 2   Years of education: Not on file   Highest education level: Doctorate  Occupational History   Occupation: retired    Comment: elementary principal  Tobacco Use   Smoking status: Never   Smokeless tobacco: Never  Vaping Use   Vaping Use: Never used  Substance and Sexual Activity   Alcohol use: No    Comment: social, 98s   Drug use: Never   Sexual activity: Yes  Other Topics Concern   Not on file  Social History Narrative   Retired Programmer, multimedia principal, married    Started disability September 2013      Has one daughter in [place], works at [employer]. Has a son, Arlys John, who is Press photographer.   Eats all food groups.   Wear seatbelt.    Attends church. [place], [place]. [employer].    Social Determinants of Health   Financial Resource Strain: Not on file  Food Insecurity: Not on file  Transportation Needs: Not on file  Physical Activity: Not on file  Stress: Not on file  Social Connections: Not on file  Intimate Partner Violence: Not on file    Family History: Family History  Problem Relation Age of Onset   Heart attack Mother        d.61   Heart  disease Mother    Anal fissures Mother    Heart attack Father        d.60   Diabetes Father    Heart disease Father    Lung cancer Sister        d.61 metastases to brain. History of smoking. Maternal half-sister.   Cancer Sister 30       unspecified gynecologic cancer (either cervical or ovarian) in her 30s.   Breast cancer Sister 74       d.42 from metastatic disease. Paternal half-sister.   Hypertension Brother    Glaucoma Brother    Parkinson's disease Paternal Aunt    Heart attack Maternal Grandmother    Cancer Maternal Grandfather 50       d.52s oral cancer   Colon cancer Neg Hx    Stomach cancer Neg Hx    Parkinsonism Neg Hx     Current Medications:  Current Outpatient Medications:    acyclovir ointment (ZOVIRAX) 5 %, Apply 1 application topically every 3 (three) hours as needed (fever blister/cold sores)., Disp: , Rfl:    ALPRAZolam (XANAX) 0.5 MG tablet, Take 1 tablet (0.5 mg total) by mouth 2 (two) times daily as needed for anxiety. (Patient taking differently: Take 0.25 mg by mouth at bedtime as needed for anxiety.), Disp: 60 tablet, Rfl: 1   APIDRA 100 UNIT/ML injection, Inject 40 Units into the skin as directed. INFUSE THROUGH INSULIN PUMP UTD, Bolus depends on carb intake, Disp: , Rfl: 2   aspirin EC 81 MG tablet, Take 81 mg by mouth at bedtime., Disp: , Rfl:    bisacodyl (DULCOLAX) 5 MG EC tablet, Take 1 tablet (5 mg total) by mouth daily  as needed for moderate constipation., Disp: 30 tablet, Rfl: 0   BRILINTA 60 MG TABS tablet, TAKE ONE TABLET (60MG  TOTAL) BY MOUTH TWICE DAILY, Disp: 180 tablet, Rfl: 3   cetirizine (ZYRTEC) 10 MG tablet, Take 10 mg by mouth in the morning., Disp: , Rfl:    clobetasol (OLUX) 0.05 % topical foam, Apply 1 application  topically 2 (two) times daily as needed (skin flaking (legs))., Disp: , Rfl:    Coenzyme Q10 (CO Q 10) 100 MG CAPS, Take 100 mg by mouth at bedtime. , Disp: , Rfl:    colchicine 0.6 MG tablet, Take 0.6 mg by mouth daily as  needed (gout flare)., Disp: , Rfl:    denosumab (PROLIA) 60 MG/ML SOSY injection, Inject 60 mg into the skin every 6 (six) months., Disp: , Rfl:    diclofenac Sodium (VOLTAREN) 1 % GEL, Apply 2 g topically 4 (four) times daily as needed (pain.)., Disp: , Rfl:    escitalopram (LEXAPRO) 5 MG tablet, Take 5 mg by mouth at bedtime., Disp: , Rfl:    famotidine (PEPCID) 40 MG tablet, Take 40 mg by mouth at bedtime. Takes a second tablet at night if needed., Disp: , Rfl:    glucagon (GLUCAGON EMERGENCY) 1 MG injection, Inject 1 mg into the vein once as needed (low blood sugar)., Disp: , Rfl:    isosorbide mononitrate (IMDUR) 120 MG 24 hr tablet, TAKE ONE TABLET (120MG  TOTAL) BY MOUTH DAILY (Patient taking differently: Take 120 mg by mouth every evening.), Disp: 90 tablet, Rfl: 3   latanoprost (XALATAN) 0.005 % ophthalmic solution, Place 1 drop into both eyes at bedtime., Disp: , Rfl:    metolazone (ZAROXOLYN) 2.5 MG tablet, Take 2.5 mg (1 tab) once as directed by CHF clinic. Call before taking [phone], Disp: 3 tablet, Rfl: 0   metoprolol succinate (TOPROL XL) 25 MG 24 hr tablet, Take 1 tablet (25 mg total) by mouth daily. (Patient taking differently: Take 25 mg by mouth at bedtime.), Disp: 90 tablet, Rfl: 3   mometasone (NASONEX) 50 MCG/ACT nasal spray, Place 1 spray into the nose at bedtime., Disp: , Rfl:    mupirocin cream (BACTROBAN) 2 %, Apply 1 application topically 2 (two) times daily as needed., Disp: 90 g, Rfl: 0   nitroGLYCERIN (NITROLINGUAL) 0.4 MG/SPRAY spray, PLACE 1 SPRAY UNDER THE TONGUE EVERY 5 MINUTES FOR 3 DOSES AS NEEDED FOR CHEST PAIN., Disp: 14.7 g, Rfl: 0   polyethylene glycol (MIRALAX / GLYCOLAX) 17 g packet, Take 17 g by mouth in the morning. Hold for loose stools, Disp: , Rfl:    pravastatin (PRAVACHOL) 10 MG tablet, TAKE ONE TABLET (10 MG TOTAL) BY MOUTH DAILY. (Patient taking differently: Take 10 mg by mouth every evening.), Disp: 90 tablet, Rfl: 3   Probiotic Product (ALIGN  PO), Take 1 tablet by mouth daily in the afternoon., Disp: , Rfl:    REPATHA SURECLICK 140 MG/ML SOAJ, INJECT 140MG  INTO THE SKIN EVERY 14 DAYS, Disp: 6 mL, Rfl: 3   rOPINIRole (REQUIP) 0.25 MG tablet, Take 1 tablet (0.25 mg total) by mouth at bedtime. Please call to schedule an overdue appointment with Dr. Mayford Knife for refills, [phone], thank you. 1st attempt, Disp: 30 tablet, Rfl: 0   silver nitrate applicators 75-25 % applicator, Use as needed to stop superficial skin bleeding., Disp: 10 each, Rfl: 0   spironolactone (ALDACTONE) 25 MG tablet, TAKE ONE-HALF TABLET (12.5MG  TOTAL) BY MOUTH DAILY, Disp: 45 tablet, Rfl: 0   torsemide (DEMADEX) 20  MG tablet, Take by mouth., Disp: , Rfl:    ULORIC 40 MG tablet, Take 1 tablet (40 mg total) by mouth at bedtime., Disp: 90 tablet, Rfl: 3   VASCEPA 1 g capsule, TAKE TWO CAPSULES (2 GRAMS TOTAL) BY MOUTH TWO TIMES DAILY, Disp: 360 capsule, Rfl: 3   Allergies: Allergies  Allergen Reactions   Avandia [Rosiglitazone Maleate] Other (See Comments)    Congestive heart failure   Cephalexin Anaphylaxis, Swelling and Rash    Extremities swell , throat swelling    Clindamycin Anaphylaxis and Shortness Of Breath    Other reaction(s): esophageal spasms   Humalog [Insulin Lispro] Itching     big knots and whelp    Lincomycin Anaphylaxis and Shortness Of Breath   Metformin Other (See Comments)    Congestive heart failure   Novolog [Insulin Aspart] Hives and Itching    Humalog & Novolog big knots and whelp     Penicillins Anaphylaxis, Hives, Swelling and Rash    Did it involve swelling of the face/tongue/throat, SOB, or low BP? Yes Did it involve sudden or severe rash/hives, skin peeling, or any reaction on the inside of your mouth or nose? No Did you need to seek medical attention at a hospital or doctor's office? Yes When did it last happen?      Childhood allergy  If all above answers are "NO", may proceed with cephalosporin use.    Tizanidine Other  (See Comments)    Dizziness, Mental Status Changes, Hallucination     Adhesive [Tape] Other (See Comments)    Tears skin, Please use "paper" tape   Atorvastatin Other (See Comments)    increased LFT's, no muscle weakness, leg pain Muscle and joint pain increased LFT's  Increased LFTs   Cholestyramine Other (See Comments)    Liver Disorder Elevated LFTs     Gentamycin [Gentamicin] Hives, Itching and Rash    Fine red bumps.  Reaction noted post loop recorder implant, where gentamycin was used for irrigation. Topical prep   Iodinated Contrast Media Other (See Comments)    PAIN DURING LYMPHANGIOGRAM '81, NO ALLERGY TO IV CONTRAST.  Has had procedures with Iodine since without problems.    Limonene Hives, Itching, Rash and Other (See Comments)    Reaction noted post loop recorder implant, where gentamycin was used for irrigation. Topical prep   Prednisone Itching and Other (See Comments)    B/P went high, itching all over.  Tolerates with benadryl    Ranexa [Ranolazine] Other (See Comments)    Nausea, dizziness, low blood sugar, tingling in right hand, "blood blisters" on tongue    Rosuvastatin Nausea And Vomiting, Nausea Only and Other (See Comments)    Muscle and joint pain & increased LFTs; tolerates Pravastatin 10mg  (max) Other reaction(s): elevated LFTs   Simvastatin Other (See Comments)    Increased LFT's    Azelastine Other (See Comments)    Other reaction(s): metallic taste,spitting up blood   Clopidogrel Other (See Comments)    Does not work for patient Medicine was not effective Other reaction(s): Unknown   Kerendia [Finerenone] Hives and Nausea Only   Lisinopril Diarrhea, Nausea Only and Other (See Comments)   Codeine Nausea And Vomiting   Erythromycin Nausea And Vomiting   Hydrocodone-Acetaminophen Itching, Rash and Other (See Comments)    itching   Latex Itching and Other (See Comments)    I"if wear gloves hands get red ,itchy no prioblem if other wear and touch  me"   Neomycin Rash and  Other (See Comments)    Redness   Nickel Itching   Ranitidine Nausea Only   Tamiflu [Oseltamivir] Nausea And Vomiting   Tramadol Nausea Only    REVIEW OF SYSTEMS:   Review of Systems  Constitutional:  Negative for chills, fatigue and fever.  HENT:   Negative for lump/mass, mouth sores, nosebleeds, sore throat and trouble swallowing.   Eyes:  Negative for eye problems.  Respiratory:  Positive for shortness of breath. Negative for cough.   Cardiovascular:  Negative for chest pain, leg swelling and palpitations.  Gastrointestinal:  Positive for constipation. Negative for abdominal pain, diarrhea, nausea and vomiting.  Genitourinary:  Negative for bladder incontinence, difficulty urinating, dysuria, frequency, hematuria and nocturia.   Musculoskeletal:  Negative for arthralgias, back pain, flank pain, myalgias and neck pain.  Skin:  Negative for itching and rash.  Neurological:  Positive for dizziness and headaches. Negative for numbness.  Hematological:  Does not bruise/bleed easily.  Psychiatric/Behavioral:  Positive for sleep disturbance. Negative for depression and suicidal ideas. The patient is nervous/anxious.   All other systems reviewed and are negative.    VITALS:   Blood pressure 123/76, pulse 78, temperature 98.1 F (36.7 C), temperature source Oral, resp. rate 17, SpO2 99 %.  Wt Readings from Last 3 Encounters:  10/26/22 118 lb 12.8 oz (53.9 kg)  08/26/22 110 lb (49.9 kg)  08/21/22 110 lb (49.9 kg)    There is no height or weight on file to calculate BMI.  Performance status (ECOG): 1 - Symptomatic but completely ambulatory  PHYSICAL EXAM:   Physical Exam Vitals and nursing note reviewed. Exam conducted with a chaperone present.  Constitutional:      Appearance: Normal appearance.  Cardiovascular:     Rate and Rhythm: Normal rate and regular rhythm.     Pulses: Normal pulses.     Heart sounds: Normal heart sounds.  Pulmonary:      Effort: Pulmonary effort is normal.     Breath sounds: Normal breath sounds.  Abdominal:     Palpations: Abdomen is soft. There is no hepatomegaly, splenomegaly or mass.     Tenderness: There is no abdominal tenderness.  Musculoskeletal:     Right lower leg: No edema.     Left lower leg: No edema.  Lymphadenopathy:     Cervical: No cervical adenopathy.     Right cervical: No superficial, deep or posterior cervical adenopathy.    Left cervical: No superficial, deep or posterior cervical adenopathy.     Upper Body:     Right upper body: No supraclavicular or axillary adenopathy.     Left upper body: No supraclavicular or axillary adenopathy.  Neurological:     General: No focal deficit present.     Mental Status: She is alert and oriented to person, place, and time.  Psychiatric:        Mood and Affect: Mood normal.        Behavior: Behavior normal.     LABS:      Latest Ref Rng & Units 11/12/2022    2:00 PM 08/21/2022    8:59 AM 08/21/2022    6:54 AM  CBC  WBC 4.0 - 10.5 K/uL 8.0   8.4   Hemoglobin 12.0 - 15.0 g/dL 16.1  09.6    04.5  40.9   Hematocrit 36.0 - 46.0 % 38.5  36.0    36.0  40.9   Platelets 150 - 400 K/uL 120   136  Latest Ref Rng & Units 11/12/2022    2:00 PM 08/31/2022   10:49 AM 08/21/2022    8:59 AM  CMP  Glucose 70 - 99 mg/dL 161  096    BUN 8 - 23 mg/dL 72  54    Creatinine 0.45 - 1.00 mg/dL 4.09  8.11    Sodium 914 - 145 mmol/L 137  134  142    141   Potassium 3.5 - 5.1 mmol/L 4.6  4.8  4.4    4.5   Chloride 98 - 111 mmol/L 100  101    CO2 22 - 32 mmol/L 26  25    Calcium 8.9 - 10.3 mg/dL 78.2  95.6    Total Protein 6.5 - 8.1 g/dL 8.9     Total Bilirubin 0.3 - 1.2 mg/dL 0.8     Alkaline Phos 38 - 126 U/L 106     AST 15 - 41 U/L 26     ALT 0 - 44 U/L 24        No results found for: "CEA1", "CEA" / No results found for: "CEA1", "CEA" No results found for: "PSA1" No results found for: "CAN199" No results found for: "CAN125"  Lab Results   Component Value Date   TOTALPROTELP 7.9 11/12/2022   TOTALPROTELP 8.2 11/12/2022   ALBUMINELP 4.2 11/12/2022   A1GS 0.2 11/12/2022   A2GS 0.9 11/12/2022   BETS 1.1 11/12/2022   GAMS 1.8 11/12/2022   MSPIKE Not Observed 11/12/2022   SPEI Comment 11/12/2022   Lab Results  Component Value Date   TIBC 365 11/16/2019   TIBC 309 12/13/2016   TIBC 248 10/17/2015   FERRITIN 157 11/16/2019   FERRITIN 294 (H) 09/16/2019   FERRITIN 477 (H) 12/13/2016   IRONPCTSAT 24 11/16/2019   IRONPCTSAT 22 12/13/2016   IRONPCTSAT 38 10/17/2015   Lab Results  Component Value Date   LDH 142 05/09/2021   LDH 137 04/25/2020   LDH 156 11/16/2019     STUDIES:   No results found.

## 2022-11-19 NOTE — Telephone Encounter (Signed)
Patient states Dr. Vassie Loll sent in a prescription for "auto ASV machine to [facility] " but they were missing diagnoses codes in order for insurance to cover it.  Please call and advise patient [phone]

## 2022-11-20 ENCOUNTER — Inpatient Hospital Stay: Payer: Medicare PPO | Admitting: Hematology

## 2022-11-20 VITALS — BP 123/76 | HR 78 | Temp 98.1°F | Resp 17

## 2022-11-20 DIAGNOSIS — Z9012 Acquired absence of left breast and nipple: Secondary | ICD-10-CM | POA: Diagnosis not present

## 2022-11-20 DIAGNOSIS — D696 Thrombocytopenia, unspecified: Secondary | ICD-10-CM

## 2022-11-20 DIAGNOSIS — C642 Malignant neoplasm of left kidney, except renal pelvis: Secondary | ICD-10-CM | POA: Diagnosis not present

## 2022-11-20 DIAGNOSIS — Z86000 Personal history of in-situ neoplasm of breast: Secondary | ICD-10-CM | POA: Diagnosis not present

## 2022-11-20 DIAGNOSIS — Z905 Acquired absence of kidney: Secondary | ICD-10-CM | POA: Diagnosis not present

## 2022-11-20 NOTE — Patient Instructions (Signed)
[facility] [place]  Discharge Instructions  You were seen and examined today by Dr. Katragadda.  Dr. Katragadda discussed your most recent lab work which revealed that everything looks good and stable.  Follow-up as scheduled in 6 months.    Thank you for choosing [facility] [place] to provide your oncology and hematology care.   To afford each patient quality time with our provider, please arrive at least 15 minutes before your scheduled appointment time. You may need to reschedule your appointment if you arrive late (10 or more minutes). Arriving late affects you and other patients whose appointments are after yours.  Also, if you miss three or more appointments without notifying the office, you may be dismissed from the clinic at the provider's discretion.    Again, thank you for choosing [facility].  Our hope is that these requests will decrease the amount of time that you wait before being seen by our physicians.   If you have a lab appointment with the Cancer Center - please note that after April 8th, all labs will be drawn in the cancer center.  You do not have to check in or register with the main entrance as you have in the past but will complete your check-in at the cancer center.            _____________________________________________________________  Should you have questions after your visit to [facility], please contact our office at [phone] and follow the prompts.  Our office hours are 8:00 a.m. to 4:30 p.m. Monday - Thursday and 8:00 a.m. to 2:30 p.m. Friday.  Please note that voicemails left after 4:00 p.m. may not be returned until the following business day.  We are closed weekends and all major holidays.  You do have access to a nurse 24-7, just call the main number to the clinic [phone] and do not press any options, hold on the line and a nurse will answer the phone.    For prescription  refill requests, have your pharmacy contact our office and allow 72 hours.    Masks are no longer required in the cancer centers. If you would like for your care team to wear a mask while they are taking care of you, please let them know. You may have one support person who is at least 70 years old accompany you for your appointments.  

## 2022-11-22 ENCOUNTER — Telehealth: Payer: Self-pay | Admitting: Pulmonary Disease

## 2022-11-22 ENCOUNTER — Other Ambulatory Visit: Payer: Self-pay

## 2022-11-22 DIAGNOSIS — G4731 Primary central sleep apnea: Secondary | ICD-10-CM

## 2022-11-22 NOTE — Telephone Encounter (Signed)
Called and spoke w/ Nida Boatman letting him know that I have fixed the dx code and order has been replaced. He verbalized understanding.

## 2022-11-22 NOTE — Telephone Encounter (Signed)
Called and spoke w/ pt let her know I provided [employer] w/ the corrected dx. She verbalized understanding.

## 2022-11-23 NOTE — Telephone Encounter (Signed)
Encounter has been handled provided DME w/ corrected dx code. NFN att.

## 2022-11-25 DIAGNOSIS — E1021 Type 1 diabetes mellitus with diabetic nephropathy: Secondary | ICD-10-CM | POA: Diagnosis not present

## 2022-11-26 NOTE — Progress Notes (Signed)
Date:  11/27/2022   ID:  [name] [name], DOB 1950-12-28, MRN [id number]   Provider location: [address] [place] Type of Visit: Established patient  PCP:  Aliene Beams, MD  Cardiologist:  None Primary HF: Dr. Shirlee Latch   History of Present Illness: [name] [name] is a 72 y.o. female who has a history of CAD s/p CABG in 12/05 and redo in 1/11, bioprosthetic AVR, diastolic CHF, and sick sinus syndrome s/p PPM presents for cardiology followup.  She had initial CABG with SVG-RCA in 12/05 followed by redo SVG-RCA and bioprosthetic AVR in 1/11.  She has had diastolic CHF and is on Lasix.  In 9/14, she had a CPX test.  This showed severe functional limitation with ischemic ECG changes (inferolateral ST depression and ST elevation in V1 and V2). She had chest pain.  She had R/LHC in 9/14.  This showed elevated left and right heart filling pressures, patent SVG-PDA, and 80% distal LM/80% ostial LAD stenosis.  She was not a candidate for redo CABG (had 2 prior sternotomies as well as chest radiation for Hodgkins lymphoma).  She had DES from left main into proximal LAD and will need long-term DAPT => Brilinta was used as she has been a poor responder to Plavix in the past.  She re-developed angina in 11/15 and was admitted with unstable angina.  LHC showed 80% ostial LM and patent SVG-RCA. She had DES to ostial LM. Echo 1/18 showed EF 60-65%, normal bioprosthetic aortic valve, mild-moderate MR, PASP 35 mmHg.    She has chronic iron deficiency anemia thought to be due to a slow GI bleed.  Last EGD was unremarkable and a capsule endoscopy was also unremarkable.  She has had periodic transfusions.     She had bilateral mastectomy for DCIS in 10/16 without complication.   She has an insulin pump followed by an endocrinologist at [facility].    Given myalgias with statins, she was started on Praluent.  LDL is now excellent.     In the past she has had short runs of atrial fibrillation.  She is  not anticoagulated. Planned for Watchman placement, but given her nickel allergy, we were unable to place Watchman.    Diagnosed with OSA, now on CPAP.    Given ongoing problems with exertional dyspnea and occasional chest pain, Cardiolite was repeated in 9/18 and showed inferior changes.  She underwent left/right heart cath in 9/18.  This showed elevated filling pressures, R>L.  There was 75+% ostial LCx stenosis.  However, there were 2 layers of stent from the mid left main into the ostial LAD, and LCx intervention was thought to be very risky.  It was decided to manage her medically.  Echo was repeated in 10/18 showing EF 55-60%, moderate to severe TR.     Given recurrent chest pain, she had ETT-Cardiolite done in 3/19.  This was a normal study with no evidence for ischemia or infarction.  Echo in 1/20 showed EF 55-60%, mild LVH, stable bioprosthetic aortic valve, moderate-severe TR.    Due to increased dyspnea and worsening renal function, she was set up for echo and RHC.  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  RHC in 8/20 showed primarily right-sided HF.  Cardiolite in 8/20 showed no ischemia or infarction.  Torsemide was increased and she cut back on sodium in her diet.    CPX in 11/20 showed severe functional limitation from HF.   She saw Dr. Edwena Blow at [facility] for evaluation for heart/kidney transplant (for restrictive cardiomyopathy).  She was determined to be a poor surgical candidate due to extensive vascular calcifications.   CT chest in 8/21 showed a loculated right pleural effusion but unable to find enough fluid by ultrasound for thoracentesis.   Echo in 9/21 showed EF 60-65% with mild LVH, mild RV dilation with mildly decreased systolic function and a  D-shaped septum, PASP 40, normal bioprosthetic aortic valve, rheumatic-appearing MV with mild-moderate mitral stenosis (mean gradient 9 but MVA by PHT 2.7 cm^2), trivial MR.    Echo 2/23, EF 60-65%, mild LVH, normal RV systolic function but with D-shaped septum, normal bioprosthetic aortic valve, mild mitral stenosis (rheumatic-appearing valve, mean gradient 4 mmHg), mild MR.   Seen by hematology 10/23. Labs showed mild hypercalcemia. Calcium level 10.4 with albumin 4.1. SPEP showed M spike.  Kappa/lambda light chain ratio 2.17. Immunofixation showed polyclonal gammopathy. Plan is to return in 6 months to repeat myeloma labs. She has not had a cMRI due to PPM.   Last OV, 12/23, she endorsed worsening HF symptoms w/ worsening exertional fatigued and dyspnea, in the absence of marked fluid overload. ReDs Clip only 36% but torsemide was increased to 40 mg daily, but dose ultimately reduced back down to aleternating 20-40 mg daily due to bump in SCr. Repeat echo was ordered by Dr. Shirlee Latch but this was actually done at [facility] 12/23. Pt reports study was required by [facility] prior to undergoing an EGD to r/o esophageal varices, as she also has diagnosis of liver cirrhosis secondary to NASH. Per report in Care Everywhere, LVEF 55%, AoV prosthesis functioning normal. RV systolic function reported to be normal. Moderate TR noted.   Today she returns for AHF follow up. Overall feeling ok, depends on the day. Denies palpitations, CP, edema, or PND/Orthopnea. Dizziness when she's in a "hurry" or going up at an incline. SOB with prolonged activity. Feels when she's a fib, happens occasionally. She hears her HR in her ears and feels in her throat. Last happened some time last week. Has to take breaks in between activities. Lots of day time sleepiness. Appetite ok. No fever or chills. Weight at home 112 pounds. Taking all medications. Follows up with nephrology.   ReDs 30%  EKG not performed   MDT device interrogation  today: atrial high rate episodes 81, AS 99.1%, AP <0.1%   Labs @ [facility] (3/19): K 4.7, creatinine 1.4, ANA negative, RF negative Labs (3/20): K 4, creatinine 1.8 Labs (6/20): K 4.7, creatinine 1.47 Labs (8/20): creatinine 2.13 => 2.46 => 2.4, LDL 22 Labs (9/20): K 4.8, creatinine 2.74, AST/ALT normal, hgb 12.6 Labs (1/21): K 4.1, creatinine 1.86, hgb 11.7 Labs (4/21): K 4.7, creatinine 1.7 Labs (6/21): K 4.2, creatinine 2 => 1.7 Labs (8/21): K 3.8, creatinine 1.69, hgb 10.6, plts 116 Labs (9/21): LDL 34, TGs 209 Labs (1/22): K 3.8, creatinine 2.0 Labs (3/22): LDL 25 Labs (4/22): K 4.7, creatinine 2.0, hgb 12.3 Labs (5/22): K 4.4, creatinine 2.21 Labs (8/22): LDL 1, TGs 330 Labs (10/22): K 4.6, creatinine 1.87 Labs (11/22): LDL 26 Labs (12/22): K 4.1, creatinine 2.25 Labs (1/23): creatinine 2.65 Labs (3/23): K 4.6, creatinine 2.8 Labs (4/23): K 4.1, creatinine 2.16, BNP 94 Labs (10/23): K 4.2, creatinine 1.62, calcium 10.4  Labs (12/23): K 4.1, creatinine 2.23  Labs (4/24): K 4.6, SCr 2.6   PMH: 1. Sick sinus syndrome s/p [employer] PCM.  2. HTN 3. H/o SBO 4. Renal cell carcinoma s/p right nephrectomy in 2008.  5. 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  Fibroscan in 11/14 showed advanced fibrosis concerning for cirrhosis. EGD in 11/14 showed no varices.  6. Diastolic CHF: Echo (4/14) with EF 60-65%, bioprosthetic aortic valve with mean gradient 15 mmHg, mild MR.  CPX (9/14): peak VO2 10.4, VE/VCO2 43.8, inferolateral ST depression and ST elevation in V1/V2 with severe dyspnea and chest pain, severe functional limitation with ischemic changes.  Echo (9/14) with EF 60-65%, normal RV size and systolic function, mild-moderate MR, bioprosthetic aortic valve normal, PA systolic pressure 51 mmHg, no evidence for pericardial constriction though exam incomplete for constriction.  Echo (11/15) with EF 55-60%, bioprosthetic aortic valve, mild-moderate MR, moderate TR, PA systolic pressure 42 mmHg.   - TEE (5/17) with EF 60-65%, normal bioprosthetic aortic valve with mean gradient 13 mmHg, normal RV size and systolic function. - Echo (1/18) with EF 60-65%, normal bioprosthetic aortic valve, mild-moderate MR, PASP 35 mmHg.  - Echo (10/18) with EF 55-60%, bioprosthetic aortic valve mean gradient 10 mmHg, MAC with mild mitral stenosis (mean gradient 7) and mild mitral regurgitation, moderate to severe TR, PASP 43 mmHg.  - RHC (9/18): mean RA 13, PA 44/18 mean 33, mean PCWP 19, CI 3.45, PVR 2.7 WU.  - Echo (1/20): EF 55-60%, mild LVH, mild MR, stable bioprosthetic aortic valve, moderate-severe TR.  - RHC (8/20): mean RA 14, PA 43/15 mean 28, mean PCWP 13, PAPI 2, PVR 4.2, CI 2.31 - Echo (8/20): EF 55-60%, RV mildly dilated with normal systolic function, mildly D-shaped septum suggestive of RV pressure/volume overload, rheumatic-appearing mitral valve with mild mitral stenosis (mean gradient 6, MVA by PHT 4 cm^2), no evidence for pericardial constriction, moderate TR, bioprosthetic aortic valve functioning normally with mean gradient 6 mmHg.  - CPX (11/20): peak VO2 9.8, VE/VCO2 slope 38, RER 1.11 => severe functional limitation from HF.   - Echo (9/21): EF 60-65% with mild LVH, mild RV dilation with mildly decreased systolic function and a D-shaped septum, PASP 40, normal bioprosthetic aortic valve, rheumatic-appearing MV with mild-moderate mitral stenosis (mean gradient 9 but MVA by PHT 2.7 cm^2, trivial MR.  - Echo (2/23): EF 60-65%, mild LVH, normal RV systolic function but with D-shaped septum, normal bioprosthetic aortic valve, mild mitral stenosis (rheumatic-appearing valve, mean gradient 4 mmHg), mild MR.  - RHC 1/24: Elevated right heart filling pressures out of proportion to PCWP suggesting primarily RV failure. Mild PAH. Restrictive filling picture. Low cardiac index at 1.71. (RA 13, PA 43/15, mean 25, PCWP 12 with v waves to 20, CO 2.54, CI 1.71) 7. TAH-BSO 8. Type I diabetes: Has insulin  pump.  9. H/o ischemic colitis. 10. H/o PUD. 11. Diabetic gastroparesis. 12. Hyperlipidemia: Myalgias with Crestor and atorvastatin, elevated LFTs with simvastatin.  Myalgias with > 10 mg pravastatin. Now on Praluent.  13. CKD stage IV: Sees Dr. Eliott Nine 14. Bicuspid aortic valve s/p 19 mm Edwards pericardial valve in 1/11 (had post-operative Dresslers syndrome).   15. CAD: CABG with SVG-RCA in 12/05.  Redo SVG-RCA in 1/11 with AVR.  LHC/RHC (9/14) with mean RA 18, PA 62/25 mean 43, mean PCWP 26, CI 2.8, 70-80% distal LM stenosis, 80% ostial LAD stenosis, total occlusion RCA, SVG-PDA patent.  Patient had DES LM into LAD.  Plan to continue long-term ASA/Brilinta (poor responder to Plavix).  ETT-cardiolite (  4/15) with 6' exercise, EF 60%, ST depression in recovery and mild chest pain, no ischemia or infarction by perfusion images.  Unstable angina 11/15 with 80% LM, patent SVG-RCA => DES to ostial LM.   - Lexiscan Cardiolite (2/18): EF 62%, no ischemia or infarction (normal).  She had a significant reaction to [place] and probably should not get again.  - Cardiolite (9/18): EF 72%, prior inferior infarct with mild peri-infarct ischemia.  - LHC (9/18): 2 layers stent left main - ostial LAD with 50% dLM in-stent restenosis, 75+% ostial LCx, totally occluded RCA, SVG-RCA patent.  Medical management planned.  - ETT-Cardiolite (4/19): EF normal, 7'11" exercise, no ischemia or infarction.  - Lexiscan Cardiolite (8/20): No ischemia/infarction.  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  17. Anemia: Iron-deficiency.  Suspect chronic GI blood loss.  EGD in 9/14 was unremarkable.  Capsule endoscopy 10/14 was unremarkable.  Has required periodic blood transfusions.  18. Atrial fibrillation: Paroxysmal, noted by [facility] interrogation.  - Zio monitor 3/23: < 1% atrial fibrillation, longest run was 18 minutes.  19. Carotid stenosis: Carotid dopplers (2/15) with 40-59% bilateral  stenosis. Carotid dopplers (3/16) with 40-59% bilateral ICA stenosis. Carotid dopplers (3/17) with 40-59% bilateral ICA stenosis.  - Carotid dopplers (3/18) with mild BICA stenosis.  20. 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  22. C difficile colitis 2017 23. Gout 24. ?TIA: head CT negative.  25. OSA: Moderate, uses CPAP.  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 Hypercalcemia 28. 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  Mild-moderate on 9/21 echo.  Mild on 2/23 echo.  29. Tricuspid regurgitation: Moderate by 8/20 echo.  30. Lung nodules: Followed at [facility] => stable.  31. ?Parkinsonism: She did not tolerate Sinemet.   Current Outpatient Medications  Medication Sig Dispense Refill   acyclovir ointment (ZOVIRAX) 5 % Apply 1 application topically every 3 (three) hours as needed (fever blister/cold sores).     ALPRAZolam (XANAX) 0.5 MG tablet Take 1 tablet (0.5 mg total) by mouth 2 (two) times daily as needed for anxiety. (Patient taking differently: Take 0.25 mg by mouth at bedtime as needed for anxiety.) 60 tablet 1   APIDRA 100 UNIT/ML injection Inject 40 Units into the skin as directed. INFUSE THROUGH INSULIN PUMP UTD, Bolus depends on carb intake  2   aspirin EC 81 MG tablet Take 81 mg by mouth at bedtime.     bisacodyl (DULCOLAX) 5 MG EC tablet Take 1 tablet (5 mg total) by mouth daily as needed for moderate constipation. 30 tablet 0   BRILINTA 60 MG TABS tablet TAKE ONE TABLET (60MG  TOTAL) BY MOUTH TWICE DAILY 180 tablet 3   cetirizine (ZYRTEC) 10 MG tablet Take 10 mg by mouth in the morning.     clobetasol (OLUX) 0.05 % topical foam Apply 1 application  topically 2 (two) times daily as needed (skin flaking (legs)).     Coenzyme Q10 (CO Q 10) 100 MG CAPS Take 100 mg by mouth at bedtime.      colchicine 0.6 MG tablet Take 0.6 mg by mouth daily as  needed (gout flare).     denosumab (PROLIA) 60 MG/ML SOSY injection Inject 60 mg into the skin every 6 (six) months.     diclofenac Sodium (VOLTAREN) 1 % GEL Apply 2 g topically 4 (four) times daily as needed (pain.).  escitalopram (LEXAPRO) 5 MG tablet Take 5 mg by mouth at bedtime.     famotidine (PEPCID) 40 MG tablet Take 40 mg by mouth at bedtime. Takes a second tablet at night if needed.     glucagon (GLUCAGON EMERGENCY) 1 MG injection Inject 1 mg into the vein once as needed (low blood sugar).     isosorbide mononitrate (IMDUR) 120 MG 24 hr tablet TAKE ONE TABLET (120MG  TOTAL) BY MOUTH DAILY (Patient taking differently: Take 120 mg by mouth every evening.) 90 tablet 3   latanoprost (XALATAN) 0.005 % ophthalmic solution Place 1 drop into both eyes at bedtime.     metolazone (ZAROXOLYN) 2.5 MG tablet Take 2.5 mg (1 tab) once as directed by CHF clinic. Call before taking [phone] 3 tablet 0   metoprolol succinate (TOPROL XL) 25 MG 24 hr tablet Take 1 tablet (25 mg total) by mouth daily. (Patient taking differently: Take 25 mg by mouth at bedtime.) 90 tablet 3   mometasone (NASONEX) 50 MCG/ACT nasal spray Place 1 spray into the nose at bedtime.     mupirocin cream (BACTROBAN) 2 % Apply 1 application topically 2 (two) times daily as needed. 90 g 0   nitroGLYCERIN (NITROLINGUAL) 0.4 MG/SPRAY spray PLACE 1 SPRAY UNDER THE TONGUE EVERY 5 MINUTES FOR 3 DOSES AS NEEDED FOR CHEST PAIN. 14.7 g 0   polyethylene glycol (MIRALAX / GLYCOLAX) 17 g packet Take 17 g by mouth in the morning. Hold for loose stools     pravastatin (PRAVACHOL) 10 MG tablet TAKE ONE TABLET (10 MG TOTAL) BY MOUTH DAILY. (Patient taking differently: Take 10 mg by mouth every evening.) 90 tablet 3   Probiotic Product (ALIGN PO) Take 1 tablet by mouth daily in the afternoon.     REPATHA SURECLICK 140 MG/ML SOAJ INJECT 140MG  INTO THE SKIN EVERY 14 DAYS 6 mL 3   rOPINIRole (REQUIP) 0.25 MG tablet Take 1 tablet (0.25 mg total) by  mouth at bedtime. Please call to schedule an overdue appointment with Dr. Mayford Knife for refills, [phone], thank you. 1st attempt 30 tablet 0   silver nitrate applicators 75-25 % applicator Use as needed to stop superficial skin bleeding. 10 each 0   spironolactone (ALDACTONE) 25 MG tablet TAKE ONE-HALF TABLET (12.5MG  TOTAL) BY MOUTH DAILY 45 tablet 0   torsemide (DEMADEX) 20 MG tablet Take by mouth. 20 mg alternating with 40 mg daily     ULORIC 40 MG tablet Take 1 tablet (40 mg total) by mouth at bedtime. 90 tablet 3   VASCEPA 1 g capsule TAKE TWO CAPSULES (2 GRAMS TOTAL) BY MOUTH TWO TIMES DAILY 360 capsule 3   No current facility-administered medications for this encounter.    Allergies:   Avandia [rosiglitazone maleate], Cephalexin, Clindamycin, Humalog [insulin lispro], Lincomycin, Metformin, Novolog [insulin aspart], Penicillins, Tizanidine, Adhesive [tape], Atorvastatin, Cholestyramine, Gentamycin [gentamicin], Iodinated contrast media, Limonene, Prednisone, Ranexa [ranolazine], Rosuvastatin, Simvastatin, Azelastine, Clopidogrel, Kerendia [finerenone], Lisinopril, Codeine, Erythromycin, Hydrocodone-acetaminophen, Latex, Neomycin, Nickel, Ranitidine, Tamiflu [oseltamivir], and Tramadol   Social History:  The patient  reports that she has never smoked. She has never used smokeless tobacco. She reports that she does not drink alcohol and does not use drugs.   Family History:  The patient's family history includes Anal fissures in her mother; Breast cancer (age of onset: 59) in her sister; Cancer (age of onset: 59) in her sister; Cancer (age of onset: 35) in her maternal grandfather; Diabetes in her father; Glaucoma in her brother; Heart attack in her  father, maternal grandmother, and mother; Heart disease in her father and mother; Hypertension in her brother; Lung cancer in her sister; Parkinson's disease in her paternal aunt.   ROS:  Please see the history of present illness.   All other  systems are personally reviewed and negative.   Exam:   BP 138/68   Pulse 75   Wt 52.2 kg (115 lb)   SpO2 98%   BMI 21.03 kg/m   PHYSICAL EXAM: General:  elderly appearing.  No respiratory difficulty. Walked into clinic HEENT: normal Neck: supple. JVD ~7 cm. Carotids 2+ bilat; no bruits. No lymphadenopathy or thyromegaly appreciated. Cor: PMI nondisplaced. Regular rate & rhythm. No rubs, gallops. + murmur. AVR Lungs: clear Abdomen: soft, nontender, nondistended. No hepatosplenomegaly. No bruits or masses. Good bowel sounds. Extremities: no cyanosis, clubbing, rash, edema  Neuro: alert & oriented x 3, cranial nerves grossly intact. moves all 4 extremities w/o difficulty. Affect pleasant.   Wt Readings from Last 3 Encounters:  11/27/22 52.2 kg (115 lb)  10/26/22 53.9 kg (118 lb 12.8 oz)  08/26/22 49.9 kg (110 lb)   ASSESSMENT AND PLAN: 1. Chronic diastolic CHF: Restrictive cardiomyopathy.  Most recent echo in 2/23 shows normal LV systolic function, normal RV systolic function but D-shaped septum suggesting RV pressure/volume overload, mild MS, normal bioprosthetic AoV, no evidence for pericardial constriction. RHC in 8/20 was concerning for right > left heart failure, she did appear to have equalization of diastolic pressures concerning for restrictive cardiomyopathy, possibly related to prior radiation. CPX in 11/20 showed severe HF limitation.  She was evaluated at [facility] for heart/kidney transplant for CKD and restrictive cardiomyopathy, but extensive vascular calcification makes her a poor surgical candidate. Balance of renal failure and RV failure has been difficult. Creatinine 2.6 most recently. She has also struggled more recently w/ worsening symptoms. Echo done at [facility] 12/23 showed LVEF 55%, AoV prosthesis functioning normal. RV systolic function reported to be normal. Moderate TR noted. Chronically NYHA Class III symptoms. Volume appears stable today, ReDs clip 30%.  - RHC 1/24  showed:  Elevated right heart filling pressures out of proportion to PCWP suggesting primarily RV failure. Mild PAH. Restrictive filling picture. Low cardiac index at 1.71. (RA 13, PA 43/15, mean 25, PCWP 12 with v waves to 20, CO 2.54, CI 1.71) - Continue Torsemide 40 mg daily alternating with 20 QOD - Check BMP - Continue spironolactone 12.5 mg daily. Take qhs to limit BP drop during the day.   - No Jardiance/Farxiga given use of insulin pump.   2. CAD: s/p SVG-RCA in 12/05, then redo SVG-RCA in 1/11--> distal left main/proximal LAD severe stenosis in 9/14.  PCI with DES to distal LM/proximal LAD was done rather than bypass as she would have been a poor candidate for redo sternotomy (2 prior sternotomies and history of chest wall radiation for Hodgkins disease).  Recurrent unstable angina with 80% ostial LM treated with repeat DES in 11/15.  Lexiscan Cardiolite in 9/18 inferior changes.  Repeat LHC (9/18) showed 50% distal left main in-stent restenosis and 75+% ostial LCx stenosis. I reviewed the films at the time with interventional cardiology => would be very difficult to intervene on the ostial LCx through 2 overlapping stents from left main into the LAD.  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 Given no ACS, we planned medical management and increased Imdur.  Cardiolite in 3/19 and again in 8/20 showed no ischemia or infarction.  She has had  some chest pain with heavy exertion, this seems chronic.  - Continue Brilinta 60 mg bid (poor responder to Plavix) long-term given recurrent left main disease.  - Continue ASA 81, pravastin/Repatha/Vascepa (recent LP 8/23, LDL 39, TG improved but still elevated at 176).  - Continue Imdur 120 mg daily.  - Continue Toprol XL 25 mg daily.   - She was unable to tolerate ranolazine.  3. Bioprosthetic aortic valve replacement: In setting of bicuspid aortic valve, thoracic aorta not dilated on 2017 TEE.  Valve looked ok on 2/23 echo.   Stable on Echo done at [facility] 12/23. 4. CKD:  Patient has a single kidney s/p right nephrectomy and suspected diabetic nephropathy.  CKD stage 4, slowly progressive. I have been concerned for cardiorenal syndrome in the setting of RV > LV failure/restrictive cardiomyopathy.   - Followed by Dr. Signe Colt  - Not candidate for PD given prior abdominal surgery.  - If CKD progresses, she may be a candidate for home HD.  - BMET today.  5. Hyperlipidemia: She has not been able to tolerate any higher potency statin than pravastatin 10 mg daily.  Most recent lipids 8/23 with LDL 39, TGs overall much improved but still mildly elevated at 176  - Continue pravastatin.  - Continue Repatha.   - She is on Vascepa for elevated TGs.  6. Anemia: Chronic/slow GI bleed.  Unremarkable EGD and capsule endoscopy in fall 2014.  Unfortunately, needs to stay on Brilinta long-term (Plavix non-responder).   7. Atrial fibrillation: Paroxysmal, very brief episodes noted on PCM interrogation. Given GI bleeding and need to be on Brilinta, risks have been thought to outweigh benefits of anticoagulation. She is not a good Multaq candidate with CHF.  If she needs an antiarrhythmic, consider Tikosyn or Sotalol but renal function may make these meds difficult to use.  Zio monitor in 3/23 showed < 1% AF, longest run 18 minutes.  - Has had several atrial high rate episodes on device interrogation, increase metoprolol XL 25 mg daily > 37.5 mg daily - We were unable to place Watchman device due to nickel allergy.   8. Carotid bruit: Only mild stenosis on last dopplers in 3/18.  9. SSS: Pacemaker in place.  10. HTN: controlled. Increase troprol XL as above.   11. OSA: Moderate, using Bipap.   - now follows with Dr. Vassie Loll, working on getting ASV machine 12. ?TIA: Transient dysarthria in 3/18, could have been afib-related TIA.  No recurrent symptoms.  13. Thrombocytopenia: Chronic and mild. Follows with hematology.  14. Mitral stenosis: Suspect  rheumatic.  Mild on 2/23 echo.  She would not be a candidate for valve replacement.  15. Hematology: MGUS noted, has seen hematology.  Low suspicion for cardiac amyloidosis  - hematology will repeat myeloma labs in 6 months.  15. Liver Cirrhosis Secondary to NASH - followed by [facility]  - EGD 07/26/22 negative for varices 16. TR - mod on recent echo at [facility] 12/23  Follow-up 3-4 months with Dr. Shirlee Latch  Signed, Alen Bleacher, NP  11/27/2022  [facility] [address] [facility] [place] [phone] (office) [phone] (fax)

## 2022-11-27 ENCOUNTER — Ambulatory Visit (HOSPITAL_COMMUNITY)
Admission: RE | Admit: 2022-11-27 | Discharge: 2022-11-27 | Disposition: A | Discharge status: Home / Self Care | Payer: Medicare PPO | Source: Ambulatory Visit | Attending: Internal Medicine | Admitting: Internal Medicine

## 2022-11-27 ENCOUNTER — Encounter (HOSPITAL_COMMUNITY): Payer: Self-pay

## 2022-11-27 VITALS — BP 138/68 | HR 75 | Wt 115.0 lb

## 2022-11-27 DIAGNOSIS — Z7902 Long term (current) use of antithrombotics/antiplatelets: Secondary | ICD-10-CM | POA: Diagnosis not present

## 2022-11-27 DIAGNOSIS — Z794 Long term (current) use of insulin: Secondary | ICD-10-CM | POA: Insufficient documentation

## 2022-11-27 DIAGNOSIS — I050 Rheumatic mitral stenosis: Secondary | ICD-10-CM

## 2022-11-27 DIAGNOSIS — Z888 Allergy status to other drugs, medicaments and biological substances status: Secondary | ICD-10-CM | POA: Insufficient documentation

## 2022-11-27 DIAGNOSIS — D696 Thrombocytopenia, unspecified: Secondary | ICD-10-CM

## 2022-11-27 DIAGNOSIS — Z951 Presence of aortocoronary bypass graft: Secondary | ICD-10-CM | POA: Diagnosis not present

## 2022-11-27 DIAGNOSIS — Z953 Presence of xenogenic heart valve: Secondary | ICD-10-CM | POA: Diagnosis not present

## 2022-11-27 DIAGNOSIS — I25110 Atherosclerotic heart disease of native coronary artery with unstable angina pectoris: Secondary | ICD-10-CM | POA: Diagnosis not present

## 2022-11-27 DIAGNOSIS — N184 Chronic kidney disease, stage 4 (severe): Secondary | ICD-10-CM

## 2022-11-27 DIAGNOSIS — Z79899 Other long term (current) drug therapy: Secondary | ICD-10-CM | POA: Diagnosis not present

## 2022-11-27 DIAGNOSIS — I071 Rheumatic tricuspid insufficiency: Secondary | ICD-10-CM

## 2022-11-27 DIAGNOSIS — Z950 Presence of cardiac pacemaker: Secondary | ICD-10-CM | POA: Insufficient documentation

## 2022-11-27 DIAGNOSIS — R0989 Other specified symptoms and signs involving the circulatory and respiratory systems: Secondary | ICD-10-CM

## 2022-11-27 DIAGNOSIS — E1022 Type 1 diabetes mellitus with diabetic chronic kidney disease: Secondary | ICD-10-CM | POA: Diagnosis not present

## 2022-11-27 DIAGNOSIS — E785 Hyperlipidemia, unspecified: Secondary | ICD-10-CM | POA: Diagnosis not present

## 2022-11-27 DIAGNOSIS — Z952 Presence of prosthetic heart valve: Secondary | ICD-10-CM

## 2022-11-27 DIAGNOSIS — Z955 Presence of coronary angioplasty implant and graft: Secondary | ICD-10-CM | POA: Insufficient documentation

## 2022-11-27 DIAGNOSIS — D649 Anemia, unspecified: Secondary | ICD-10-CM | POA: Diagnosis not present

## 2022-11-27 DIAGNOSIS — I2510 Atherosclerotic heart disease of native coronary artery without angina pectoris: Secondary | ICD-10-CM

## 2022-11-27 DIAGNOSIS — G4733 Obstructive sleep apnea (adult) (pediatric): Secondary | ICD-10-CM | POA: Diagnosis not present

## 2022-11-27 DIAGNOSIS — D472 Monoclonal gammopathy: Secondary | ICD-10-CM

## 2022-11-27 DIAGNOSIS — K7460 Unspecified cirrhosis of liver: Secondary | ICD-10-CM | POA: Diagnosis not present

## 2022-11-27 DIAGNOSIS — Q231 Congenital insufficiency of aortic valve: Secondary | ICD-10-CM | POA: Insufficient documentation

## 2022-11-27 DIAGNOSIS — I425 Other restrictive cardiomyopathy: Secondary | ICD-10-CM | POA: Diagnosis not present

## 2022-11-27 DIAGNOSIS — Z8673 Personal history of transient ischemic attack (TIA), and cerebral infarction without residual deficits: Secondary | ICD-10-CM

## 2022-11-27 DIAGNOSIS — R42 Dizziness and giddiness: Secondary | ICD-10-CM | POA: Insufficient documentation

## 2022-11-27 DIAGNOSIS — Z9641 Presence of insulin pump (external) (internal): Secondary | ICD-10-CM | POA: Insufficient documentation

## 2022-11-27 DIAGNOSIS — I495 Sick sinus syndrome: Secondary | ICD-10-CM

## 2022-11-27 DIAGNOSIS — I10 Essential (primary) hypertension: Secondary | ICD-10-CM | POA: Diagnosis not present

## 2022-11-27 DIAGNOSIS — I480 Paroxysmal atrial fibrillation: Secondary | ICD-10-CM

## 2022-11-27 DIAGNOSIS — I130 Hypertensive heart and chronic kidney disease with heart failure and stage 1 through stage 4 chronic kidney disease, or unspecified chronic kidney disease: Secondary | ICD-10-CM | POA: Insufficient documentation

## 2022-11-27 DIAGNOSIS — K7581 Nonalcoholic steatohepatitis (NASH): Secondary | ICD-10-CM | POA: Insufficient documentation

## 2022-11-27 DIAGNOSIS — I5032 Chronic diastolic (congestive) heart failure: Secondary | ICD-10-CM

## 2022-11-27 LAB — BASIC METABOLIC PANEL
Anion gap: 11 (ref 5–15)
BUN: 95 mg/dL — ABNORMAL HIGH (ref 8–23)
CO2: 26 mmol/L (ref 22–32)
Calcium: 10.8 mg/dL — ABNORMAL HIGH (ref 8.9–10.3)
Chloride: 101 mmol/L (ref 98–111)
Creatinine, Ser: 2.95 mg/dL — ABNORMAL HIGH (ref 0.44–1.00)
GFR, Estimated: 17 mL/min — ABNORMAL LOW (ref >60)
Glucose, Bld: 120 mg/dL — ABNORMAL HIGH (ref 70–99)
Potassium: 4.3 mmol/L (ref 3.5–5.1)
Sodium: 138 mmol/L (ref 135–145)

## 2022-11-27 MED ORDER — TORSEMIDE 20 MG PO TABS: 40.0000 mg | ORAL_TABLET | ORAL | 8 refills | Status: DC

## 2022-11-27 MED ORDER — METOPROLOL SUCCINATE ER 25 MG PO TB24: 37.5000 mg | ORAL_TABLET | Freq: Every day | ORAL | 8 refills | Status: DC

## 2022-11-27 NOTE — Patient Instructions (Signed)
Thank you for coming in today  If you had labs drawn today, any labs that are abnormal the clinic will call you No news is good news  Medications: Increase Metoprolol XL to 37.5 mg 1 1/2 tablet daily   Follow up appointments:  Your physician recommends that you schedule a follow-up appointment in:  3-4 months With Dr. Shirlee Latch    Do the following things EVERYDAY: Weigh yourself in the morning before breakfast. Write it down and keep it in a log. Take your medicines as prescribed Eat low salt foods--Limit salt (sodium) to 2000 mg per day.  Stay as active as you can everyday Limit all fluids for the day to less than 2 liters   At the [facility], you and your health needs are our priority. As part of our continuing mission to provide you with exceptional heart care, we have created designated Provider Care Teams. These Care Teams include your primary Cardiologist (physician) and Advanced Practice Providers (APPs- Physician Assistants and Nurse Practitioners) who all work together to provide you with the care you need, when you need it.   You may see any of the following providers on your designated Care Team at your next follow up: Dr Arvilla Meres Dr Marca Ancona Dr. Marcos Eke, NP Robbie Lis, [place] [facility] [place], [place] Brynda Peon, NP Karle Plumber, PharmD   Please be sure to bring in all your medications bottles to every appointment.    Thank you for choosing [facility]  If you have any questions or concerns before your next appointment please send [name] a message through [place] or call our office at [phone].    TO LEAVE A MESSAGE FOR THE NURSE SELECT OPTION 2, PLEASE LEAVE A MESSAGE INCLUDING: YOUR NAME DATE OF BIRTH CALL BACK NUMBER REASON FOR CALL**this is important as we prioritize the call backs  YOU WILL RECEIVE A CALL BACK THE SAME DAY AS LONG AS YOU CALL BEFORE  4:00 PM

## 2022-11-27 NOTE — Addendum Note (Signed)
Encounter addended by: Demetrius Charity, RN on: 11/27/2022 3:59 PM  Actions taken: Pharmacy for encounter modified, Order list changed

## 2022-11-27 NOTE — Progress Notes (Signed)
ReDS Vest / Clip - 11/27/22 1200       ReDS Vest / Clip   Station Marker A    Ruler Value 28    ReDS Value Range Low volume    ReDS Actual Value 30

## 2022-11-28 DIAGNOSIS — G4731 Primary central sleep apnea: Secondary | ICD-10-CM | POA: Diagnosis not present

## 2022-11-30 ENCOUNTER — Encounter (HOSPITAL_COMMUNITY): Payer: Self-pay | Admitting: Cardiology

## 2022-12-01 DIAGNOSIS — H0014 Chalazion left upper eyelid: Secondary | ICD-10-CM | POA: Diagnosis not present

## 2022-12-03 NOTE — Telephone Encounter (Signed)
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

## 2022-12-07 ENCOUNTER — Ambulatory Visit (HOSPITAL_COMMUNITY)
Admission: RE | Admit: 2022-12-07 | Discharge: 2022-12-07 | Disposition: A | Discharge status: Home / Self Care | Payer: Medicare PPO | Source: Ambulatory Visit | Attending: Cardiology | Admitting: Cardiology

## 2022-12-07 DIAGNOSIS — I5022 Chronic systolic (congestive) heart failure: Secondary | ICD-10-CM | POA: Insufficient documentation

## 2022-12-07 LAB — BASIC METABOLIC PANEL
Anion gap: 13 (ref 5–15)
BUN: 78 mg/dL — ABNORMAL HIGH (ref 8–23)
CO2: 23 mmol/L (ref 22–32)
Calcium: 10.5 mg/dL — ABNORMAL HIGH (ref 8.9–10.3)
Chloride: 99 mmol/L (ref 98–111)
Creatinine, Ser: 2.85 mg/dL — ABNORMAL HIGH (ref 0.44–1.00)
GFR, Estimated: 17 mL/min — ABNORMAL LOW (ref >60)
Glucose, Bld: 163 mg/dL — ABNORMAL HIGH (ref 70–99)
Potassium: 4.4 mmol/L (ref 3.5–5.1)
Sodium: 135 mmol/L (ref 135–145)

## 2022-12-10 DIAGNOSIS — G2581 Restless legs syndrome: Secondary | ICD-10-CM | POA: Diagnosis not present

## 2022-12-10 DIAGNOSIS — G250 Essential tremor: Secondary | ICD-10-CM | POA: Diagnosis not present

## 2022-12-18 ENCOUNTER — Other Ambulatory Visit (HOSPITAL_COMMUNITY): Payer: Self-pay

## 2022-12-18 ENCOUNTER — Institutional Professional Consult (permissible substitution): Payer: Medicare PPO | Admitting: Pulmonary Disease

## 2022-12-18 MED ORDER — SPIRONOLACTONE 25 MG PO TABS: ORAL_TABLET | ORAL | 0 refills | Status: DC

## 2022-12-24 DIAGNOSIS — E1021 Type 1 diabetes mellitus with diabetic nephropathy: Secondary | ICD-10-CM | POA: Diagnosis not present

## 2022-12-25 ENCOUNTER — Other Ambulatory Visit (HOSPITAL_COMMUNITY): Payer: Self-pay | Admitting: Cardiology

## 2022-12-25 DIAGNOSIS — E1021 Type 1 diabetes mellitus with diabetic nephropathy: Secondary | ICD-10-CM | POA: Diagnosis not present

## 2022-12-29 DIAGNOSIS — G4731 Primary central sleep apnea: Secondary | ICD-10-CM | POA: Diagnosis not present

## 2023-01-01 ENCOUNTER — Ambulatory Visit (INDEPENDENT_AMBULATORY_CARE_PROVIDER_SITE_OTHER): Payer: Medicare PPO

## 2023-01-01 DIAGNOSIS — I495 Sick sinus syndrome: Secondary | ICD-10-CM

## 2023-01-03 DIAGNOSIS — E10649 Type 1 diabetes mellitus with hypoglycemia without coma: Secondary | ICD-10-CM | POA: Diagnosis not present

## 2023-01-03 DIAGNOSIS — E1022 Type 1 diabetes mellitus with diabetic chronic kidney disease: Secondary | ICD-10-CM | POA: Diagnosis not present

## 2023-01-03 DIAGNOSIS — E1122 Type 2 diabetes mellitus with diabetic chronic kidney disease: Secondary | ICD-10-CM | POA: Diagnosis not present

## 2023-01-03 DIAGNOSIS — I2510 Atherosclerotic heart disease of native coronary artery without angina pectoris: Secondary | ICD-10-CM | POA: Diagnosis not present

## 2023-01-03 DIAGNOSIS — R42 Dizziness and giddiness: Secondary | ICD-10-CM | POA: Diagnosis not present

## 2023-01-03 DIAGNOSIS — E1043 Type 1 diabetes mellitus with diabetic autonomic (poly)neuropathy: Secondary | ICD-10-CM | POA: Diagnosis not present

## 2023-01-03 DIAGNOSIS — D631 Anemia in chronic kidney disease: Secondary | ICD-10-CM | POA: Diagnosis not present

## 2023-01-03 DIAGNOSIS — Z9641 Presence of insulin pump (external) (internal): Secondary | ICD-10-CM | POA: Diagnosis not present

## 2023-01-03 DIAGNOSIS — H9313 Tinnitus, bilateral: Secondary | ICD-10-CM | POA: Diagnosis not present

## 2023-01-03 DIAGNOSIS — N184 Chronic kidney disease, stage 4 (severe): Secondary | ICD-10-CM | POA: Diagnosis not present

## 2023-01-03 DIAGNOSIS — E1021 Type 1 diabetes mellitus with diabetic nephropathy: Secondary | ICD-10-CM | POA: Diagnosis not present

## 2023-01-03 DIAGNOSIS — E1065 Type 1 diabetes mellitus with hyperglycemia: Secondary | ICD-10-CM | POA: Diagnosis not present

## 2023-01-03 DIAGNOSIS — I509 Heart failure, unspecified: Secondary | ICD-10-CM | POA: Diagnosis not present

## 2023-01-04 LAB — LAB REPORT - SCANNED
Albumin, Urine POC: 19
Albumin/Creatinine Ratio, Urine, POC: 52
Creatinine, POC: 36.5 mg/dL
EGFR: 19

## 2023-01-07 LAB — CUP PACEART REMOTE DEVICE CHECK
Battery Impedance: 1347 Ohm
Battery Remaining Longevity: 57 mo
Battery Voltage: 2.78 V
Brady Statistic AP VP Percent: 0 %
Brady Statistic AP VS Percent: 1 %
Brady Statistic AS VP Percent: 0 %
Brady Statistic AS VS Percent: 99 %
Date Time Interrogation Session: 20240614174626
Implantable Lead Connection Status: 753985
Implantable Lead Connection Status: 753985
Implantable Lead Implant Date: 20140130
Implantable Lead Implant Date: 20140130
Implantable Lead Location: 753859
Implantable Lead Location: 753860
Implantable Lead Model: 5076
Implantable Lead Model: 5076
Implantable Pulse Generator Implant Date: 20140130
Lead Channel Impedance Value: 396 Ohm
Lead Channel Impedance Value: 565 Ohm
Lead Channel Pacing Threshold Amplitude: 0.5 V
Lead Channel Pacing Threshold Amplitude: 0.625 V
Lead Channel Pacing Threshold Pulse Width: 0.4 ms
Lead Channel Pacing Threshold Pulse Width: 0.4 ms
Lead Channel Setting Pacing Amplitude: 1.5 V
Lead Channel Setting Pacing Amplitude: 2.5 V
Lead Channel Setting Pacing Pulse Width: 0.4 ms
Lead Channel Setting Sensing Sensitivity: 5.6 mV
Zone Setting Status: 755011
Zone Setting Status: 755011

## 2023-01-10 ENCOUNTER — Telehealth (HOSPITAL_COMMUNITY): Payer: Self-pay | Admitting: Cardiology

## 2023-01-10 NOTE — Telephone Encounter (Signed)
LMOM

## 2023-01-10 NOTE — Telephone Encounter (Signed)
Patient called to request sooner appointment Reports an event her mind telling her to go backward and her body moving forward Increase in fatigue Increase in dizziness  Reports she was seen by nephrology -was advised R side heart weakness could be contributing to symptoms of increase weakness -would like to know if medications could assist with symptoms   F/u 6/25  Will forward to provider in the event urgent changes are needed

## 2023-01-10 NOTE — Telephone Encounter (Signed)
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 If symptoms continue she should go to the ED for additional work up.   Ask to submit download for [employer].   We can address diuretics based on device interrogation.   Keep appointment with Dr Shirlee Latch 01/15/23.   Konni Kesinger NP-C  12:31 PM

## 2023-01-11 DIAGNOSIS — Z961 Presence of intraocular lens: Secondary | ICD-10-CM | POA: Diagnosis not present

## 2023-01-11 DIAGNOSIS — H524 Presbyopia: Secondary | ICD-10-CM | POA: Diagnosis not present

## 2023-01-11 DIAGNOSIS — H401131 Primary open-angle glaucoma, bilateral, mild stage: Secondary | ICD-10-CM | POA: Diagnosis not present

## 2023-01-15 ENCOUNTER — Other Ambulatory Visit: Payer: Self-pay

## 2023-01-15 ENCOUNTER — Ambulatory Visit (HOSPITAL_COMMUNITY)
Admission: RE | Admit: 2023-01-15 | Discharge: 2023-01-15 | Disposition: A | Discharge status: Home / Self Care | Payer: Medicare PPO | Source: Ambulatory Visit | Attending: Cardiology | Admitting: Cardiology

## 2023-01-15 VITALS — BP 118/82 | HR 73 | Wt 116.2 lb

## 2023-01-15 DIAGNOSIS — K7460 Unspecified cirrhosis of liver: Secondary | ICD-10-CM | POA: Insufficient documentation

## 2023-01-15 DIAGNOSIS — E785 Hyperlipidemia, unspecified: Secondary | ICD-10-CM | POA: Insufficient documentation

## 2023-01-15 DIAGNOSIS — I495 Sick sinus syndrome: Secondary | ICD-10-CM | POA: Diagnosis not present

## 2023-01-15 DIAGNOSIS — Z794 Long term (current) use of insulin: Secondary | ICD-10-CM | POA: Insufficient documentation

## 2023-01-15 DIAGNOSIS — Z79899 Other long term (current) drug therapy: Secondary | ICD-10-CM | POA: Diagnosis not present

## 2023-01-15 DIAGNOSIS — Z953 Presence of xenogenic heart valve: Secondary | ICD-10-CM | POA: Diagnosis not present

## 2023-01-15 DIAGNOSIS — I5032 Chronic diastolic (congestive) heart failure: Secondary | ICD-10-CM | POA: Diagnosis not present

## 2023-01-15 DIAGNOSIS — I480 Paroxysmal atrial fibrillation: Secondary | ICD-10-CM | POA: Diagnosis not present

## 2023-01-15 DIAGNOSIS — Z950 Presence of cardiac pacemaker: Secondary | ICD-10-CM | POA: Insufficient documentation

## 2023-01-15 DIAGNOSIS — E1022 Type 1 diabetes mellitus with diabetic chronic kidney disease: Secondary | ICD-10-CM | POA: Diagnosis not present

## 2023-01-15 DIAGNOSIS — G4733 Obstructive sleep apnea (adult) (pediatric): Secondary | ICD-10-CM | POA: Insufficient documentation

## 2023-01-15 DIAGNOSIS — Z9013 Acquired absence of bilateral breasts and nipples: Secondary | ICD-10-CM | POA: Insufficient documentation

## 2023-01-15 DIAGNOSIS — Z923 Personal history of irradiation: Secondary | ICD-10-CM | POA: Diagnosis not present

## 2023-01-15 DIAGNOSIS — Z951 Presence of aortocoronary bypass graft: Secondary | ICD-10-CM | POA: Insufficient documentation

## 2023-01-15 DIAGNOSIS — N184 Chronic kidney disease, stage 4 (severe): Secondary | ICD-10-CM | POA: Diagnosis not present

## 2023-01-15 DIAGNOSIS — I25110 Atherosclerotic heart disease of native coronary artery with unstable angina pectoris: Secondary | ICD-10-CM | POA: Insufficient documentation

## 2023-01-15 DIAGNOSIS — Z853 Personal history of malignant neoplasm of breast: Secondary | ICD-10-CM | POA: Insufficient documentation

## 2023-01-15 DIAGNOSIS — Z8249 Family history of ischemic heart disease and other diseases of the circulatory system: Secondary | ICD-10-CM | POA: Diagnosis not present

## 2023-01-15 DIAGNOSIS — I130 Hypertensive heart and chronic kidney disease with heart failure and stage 1 through stage 4 chronic kidney disease, or unspecified chronic kidney disease: Secondary | ICD-10-CM | POA: Diagnosis not present

## 2023-01-15 DIAGNOSIS — Z9641 Presence of insulin pump (external) (internal): Secondary | ICD-10-CM | POA: Diagnosis not present

## 2023-01-15 DIAGNOSIS — D696 Thrombocytopenia, unspecified: Secondary | ICD-10-CM | POA: Diagnosis not present

## 2023-01-15 DIAGNOSIS — D509 Iron deficiency anemia, unspecified: Secondary | ICD-10-CM | POA: Insufficient documentation

## 2023-01-15 DIAGNOSIS — Z955 Presence of coronary angioplasty implant and graft: Secondary | ICD-10-CM | POA: Diagnosis not present

## 2023-01-15 DIAGNOSIS — Z7902 Long term (current) use of antithrombotics/antiplatelets: Secondary | ICD-10-CM | POA: Insufficient documentation

## 2023-01-15 DIAGNOSIS — D472 Monoclonal gammopathy: Secondary | ICD-10-CM | POA: Insufficient documentation

## 2023-01-15 DIAGNOSIS — Z803 Family history of malignant neoplasm of breast: Secondary | ICD-10-CM | POA: Insufficient documentation

## 2023-01-15 DIAGNOSIS — I425 Other restrictive cardiomyopathy: Secondary | ICD-10-CM | POA: Insufficient documentation

## 2023-01-15 LAB — LIPID PANEL
Cholesterol: 95 mg/dL (ref 0–200)
HDL: 38 mg/dL — ABNORMAL LOW (ref >40)
LDL Cholesterol: 24 mg/dL (ref 0–99)
Total CHOL/HDL Ratio: 2.5 RATIO
Triglycerides: 166 mg/dL — ABNORMAL HIGH (ref <150)
VLDL: 33 mg/dL (ref 0–40)

## 2023-01-15 LAB — COMPREHENSIVE METABOLIC PANEL
ALT: 19 U/L (ref 0–44)
AST: 28 U/L (ref 15–41)
Albumin: 4.5 g/dL (ref 3.5–5.0)
Alkaline Phosphatase: 86 U/L (ref 38–126)
Anion gap: 13 (ref 5–15)
BUN: 90 mg/dL — ABNORMAL HIGH (ref 8–23)
CO2: 24 mmol/L (ref 22–32)
Calcium: 10.1 mg/dL (ref 8.9–10.3)
Chloride: 98 mmol/L (ref 98–111)
Creatinine, Ser: 3.02 mg/dL — ABNORMAL HIGH (ref 0.44–1.00)
GFR, Estimated: 16 mL/min — ABNORMAL LOW (ref >60)
Glucose, Bld: 206 mg/dL — ABNORMAL HIGH (ref 70–99)
Potassium: 4.6 mmol/L (ref 3.5–5.1)
Sodium: 135 mmol/L (ref 135–145)
Total Bilirubin: 0.9 mg/dL (ref 0.3–1.2)
Total Protein: 8.7 g/dL — ABNORMAL HIGH (ref 6.5–8.1)

## 2023-01-15 LAB — BRAIN NATRIURETIC PEPTIDE: B Natriuretic Peptide: 118 pg/mL — ABNORMAL HIGH (ref 0.0–100.0)

## 2023-01-15 MED ORDER — METOPROLOL SUCCINATE ER 25 MG PO TB24: 25.0000 mg | ORAL_TABLET | Freq: Every day | ORAL | 3 refills | Status: DC

## 2023-01-15 NOTE — Patient Instructions (Addendum)
DECREASE Toprol XL to 25 mg daily.  Labs done today, your results will be available in MyChart, we will contact you for abnormal readings.  Your physician recommends that you schedule a follow-up appointment in: 3 months.   If you have any questions or concerns before your next appointment please send [name] a message through [place] or call our office at [phone].    TO LEAVE A MESSAGE FOR THE NURSE SELECT OPTION 2, PLEASE LEAVE A MESSAGE INCLUDING: YOUR NAME DATE OF BIRTH CALL BACK NUMBER REASON FOR CALL**this is important as we prioritize the call backs  YOU WILL RECEIVE A CALL BACK THE SAME DAY AS LONG AS YOU CALL BEFORE 4:00 PM  At the [facility], you and your health needs are our priority. As part of our continuing mission to provide you with exceptional heart care, we have created designated Provider Care Teams. These Care Teams include your primary Cardiologist (physician) and Advanced Practice Providers (APPs- Physician Assistants and Nurse Practitioners) who all work together to provide you with the care you need, when you need it.   You may see any of the following providers on your designated Care Team at your next follow up: Dr Arvilla Meres Dr Marca Ancona Dr. Marcos Eke, NP Robbie Lis, [place] [facility] [place], [place] Brynda Peon, NP Karle Plumber, PharmD   Please be sure to bring in all your medications bottles to every appointment.    Thank you for choosing [facility]

## 2023-01-15 NOTE — Progress Notes (Signed)
Date:  01/15/2023   ID:  [name], [name] 07-27-1950, MRN [id number]   Provider location: [address], [place] Type of Visit: Established patient  PCP:  Aliene Beams, MD  Cardiologist:  None Primary HF: Dr. Shirlee Latch   History of Present Illness: [name] [name] is a 72 y.o. female who has a history of CAD s/p CABG in 12/05 and redo in 1/11, bioprosthetic AVR, diastolic CHF, and sick sinus syndrome s/p PPM presents for cardiology followup.  She had initial CABG with SVG-RCA in 12/05 followed by redo SVG-RCA and bioprosthetic AVR in 1/11.  She has had diastolic CHF and is on Lasix.  In 9/14, she had a CPX test.  This showed severe functional limitation with ischemic ECG changes (inferolateral ST depression and ST elevation in V1 and V2). She had chest pain.  She had R/LHC in 9/14.  This showed elevated left and right heart filling pressures, patent SVG-PDA, and 80% distal LM/80% ostial LAD stenosis.  She was not a candidate for redo CABG (had 2 prior sternotomies as well as chest radiation for Hodgkins lymphoma).  She had DES from left main into proximal LAD and will need long-term DAPT => Brilinta was used as she has been a poor responder to Plavix in the past.  She re-developed angina in 11/15 and was admitted with unstable angina.  LHC showed 80% ostial LM and patent SVG-RCA. She had DES to ostial LM. Echo 1/18 showed EF 60-65%, normal bioprosthetic aortic valve, mild-moderate MR, PASP 35 mmHg.    She has chronic iron deficiency anemia thought to be due to a slow GI bleed.  Last EGD was unremarkable and a capsule endoscopy was also unremarkable.  She has had periodic transfusions.     She had bilateral mastectomy for DCIS in 10/16 without complication.   She has an insulin pump followed by an endocrinologist at [facility].    Given myalgias with statins, she was started on Praluent.  LDL is now excellent.     In the past she has had short runs of atrial fibrillation.  She is  not anticoagulated. Planned for Watchman placement, but given her nickel allergy, we were unable to place Watchman.    Diagnosed with OSA, now on CPAP.    Given ongoing problems with exertional dyspnea and occasional chest pain, Cardiolite was repeated in 9/18 and showed inferior changes.  She underwent left/right heart cath in 9/18.  This showed elevated filling pressures, R>L.  There was 75+% ostial LCx stenosis.  However, there were 2 layers of stent from the mid left main into the ostial LAD, and LCx intervention was thought to be very risky.  It was decided to manage her medically.  Echo was repeated in 10/18 showing EF 55-60%, moderate to severe TR.     Given recurrent chest pain, she had ETT-Cardiolite done in 3/19.  This was a normal study with no evidence for ischemia or infarction.  Echo in 1/20 showed EF 55-60%, mild LVH, stable bioprosthetic aortic valve, moderate-severe TR.    Due to increased dyspnea and worsening renal function, she was set up for echo and RHC.  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  RHC in 8/20 showed primarily right-sided HF.  Cardiolite in 8/20 showed no ischemia or infarction.  Torsemide was increased and she cut back on sodium in her diet.    CPX in 11/20 showed severe functional limitation from HF.   She saw Dr. Edwena Blow at [facility] for evaluation for heart/kidney transplant (for restrictive cardiomyopathy).  She was determined to be a poor surgical candidate due to extensive vascular calcifications.   CT chest in 8/21 showed a loculated right pleural effusion but unable to find enough fluid by ultrasound for thoracentesis.   Echo in 9/21 showed EF 60-65% with mild LVH, mild RV dilation with mildly decreased systolic function and a  D-shaped septum, PASP 40, normal bioprosthetic aortic valve, rheumatic-appearing MV with mild-moderate mitral stenosis (mean gradient 9 but MVA by PHT 2.7 cm^2), trivial MR.    Echo 2/23, EF 60-65%, mild LVH, normal RV systolic function but with D-shaped septum, normal bioprosthetic aortic valve, mild mitral stenosis (rheumatic-appearing valve, mean gradient 4 mmHg), mild MR.   Seen by hematology 10/23. Labs showed mild hypercalcemia. Calcium level 10.4 with albumin 4.1. SPEP showed M spike.  Kappa/lambda light chain ratio 2.17. Immunofixation showed polyclonal gammopathy. Plan is to return in 6 months to repeat myeloma labs. She has not had a cMRI due to PPM.   Echo at [facility] in 12/23 showed LVEF 55%, AoV bioprosthesis functioning normal. RV systolic function reported to be normal. Moderate TR noted. No mention of mitral stenosis.   Today she returns for followup of CHF.  She has been very weak recently.  She has had a tremor that neurology thinks is not parkinsonian but possibly essential tremor.  She has been sleeping a lot.  Can do light housework but gets fatigue and dyspnea.  She is short of breath if she walks fast or tries to carry groceries.  No orthopnea/PND.  No chest pain. Using her Bipap at night. No syncope, rare palpitations.   ECG (personally reviewed): NSR, long 1st degree AVB, LVH   Labs @ [facility] (3/19): K 4.7, creatinine 1.4, ANA negative, RF negative Labs (3/20): K 4, creatinine 1.8 Labs (6/20): K 4.7, creatinine 1.47 Labs (8/20): creatinine 2.13 => 2.46 => 2.4, LDL 22 Labs (9/20): K 4.8, creatinine 2.74, AST/ALT normal, hgb 12.6 Labs (1/21): K 4.1, creatinine 1.86, hgb 11.7 Labs (4/21): K 4.7, creatinine 1.7 Labs (6/21): K 4.2, creatinine 2 => 1.7 Labs (8/21): K 3.8, creatinine 1.69, hgb 10.6, plts 116 Labs (9/21): LDL 34, TGs 209 Labs (1/22): K 3.8, creatinine 2.0 Labs (3/22): LDL 25 Labs (4/22): K 4.7, creatinine 2.0, hgb 12.3 Labs (5/22): K 4.4, creatinine 2.21 Labs  (8/22): LDL 1, TGs 330 Labs (10/22): K 4.6, creatinine 1.87 Labs (11/22): LDL 26 Labs (12/22): K 4.1, creatinine 2.25 Labs (1/23): creatinine 2.65 Labs (3/23): K 4.6, creatinine 2.8 Labs (4/23): K 4.1, creatinine 2.16, BNP 94 Labs (10/23): K 4.2, creatinine 1.62, calcium 10.4  Labs (12/23): K 4.1, creatinine 2.23 Labs (4/24): K 4.6, SCr 2.6, hgb 12.5 Labs (5/24): K 4.4, creatinine 2.85   PMH: 1. Sick sinus syndrome s/p [employer] PCM.  2. HTN 3. H/o SBO 4. Renal cell carcinoma s/p right nephrectomy in 2008.  5. 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  Fibroscan in 11/14 showed advanced fibrosis concerning for cirrhosis. EGD in 11/14 showed no varices.  6. Diastolic CHF: Echo (4/14) with EF 60-65%, bioprosthetic aortic valve with mean gradient 15 mmHg, mild MR.  CPX (9/14): peak VO2 10.4, VE/VCO2 43.8, inferolateral ST depression and ST elevation in V1/V2 with severe dyspnea and chest pain, severe functional limitation with ischemic changes.  Echo (9/14) with EF 60-65%, normal RV size and systolic function, mild-moderate MR, bioprosthetic aortic valve normal, PA systolic pressure 51 mmHg, no evidence for pericardial constriction though exam incomplete for constriction.  Echo (11/15) with EF 55-60%, bioprosthetic aortic valve, mild-moderate MR, moderate TR, PA systolic pressure 42 mmHg.  - TEE (5/17) with EF 60-65%, normal bioprosthetic aortic valve with mean gradient 13 mmHg, normal RV size and systolic function. - Echo (1/18) with EF 60-65%, normal bioprosthetic aortic valve, mild-moderate MR, PASP 35 mmHg.  - Echo (10/18) with EF 55-60%, bioprosthetic aortic valve mean gradient 10 mmHg, MAC with mild mitral stenosis (mean gradient 7) and mild mitral regurgitation, moderate to severe TR, PASP 43 mmHg.  - RHC (9/18): mean RA 13, PA 44/18 mean 33, mean PCWP 19, CI 3.45, PVR 2.7 WU.  - Echo (1/20): EF 55-60%, mild LVH, mild MR, stable bioprosthetic aortic valve, moderate-severe TR.  - RHC (8/20):  mean RA 14, PA 43/15 mean 28, mean PCWP 13, PAPI 2, PVR 4.2, CI 2.31 - Echo (8/20): EF 55-60%, RV mildly dilated with normal systolic function, mildly D-shaped septum suggestive of RV pressure/volume overload, rheumatic-appearing mitral valve with mild mitral stenosis (mean gradient 6, MVA by PHT 4 cm^2), no evidence for pericardial constriction, moderate TR, bioprosthetic aortic valve functioning normally with mean gradient 6 mmHg.  - CPX (11/20): peak VO2 9.8, VE/VCO2 slope 38, RER 1.11 => severe functional limitation from HF.   - Echo (9/21): EF 60-65% with mild LVH, mild RV dilation with mildly decreased systolic function and a D-shaped septum, PASP 40, normal bioprosthetic aortic valve, rheumatic-appearing MV with mild-moderate mitral stenosis (mean gradient 9 but MVA by PHT 2.7 cm^2, trivial MR.  - Echo (2/23): EF 60-65%, mild LVH, normal RV systolic function but with D-shaped septum, normal bioprosthetic aortic valve, mild mitral stenosis (rheumatic-appearing valve, mean gradient 4 mmHg), mild MR.  - RHC 1/24: Elevated right heart filling pressures out of proportion to PCWP suggesting primarily RV failure. Mild PAH. Restrictive filling picture. Low cardiac index at 1.71. (RA 13, PA 43/15, mean 25, PCWP 12 with v waves to 20, CO 2.54, CI 1.71) - Echo (12/23, [facility]): LVEF 55%, AoV bioprosthesis functioning normal. RV systolic function reported to be normal. Moderate TR noted. No mention of mitral stenosis.  7. TAH-BSO 8. Type I diabetes: Has insulin pump.  9. H/o ischemic colitis. 10. H/o PUD. 11. Diabetic gastroparesis. 12. Hyperlipidemia: Myalgias with Crestor and atorvastatin, elevated LFTs with simvastatin.  Myalgias with > 10 mg pravastatin. Now on Praluent.  13. CKD stage IV: Sees Dr. Eliott Nine 14. Bicuspid aortic valve s/p 19 mm Edwards pericardial valve in 1/11 (had post-operative Dresslers syndrome).   15. CAD: CABG with SVG-RCA in 12/05.  Redo SVG-RCA in 1/11 with AVR.  LHC/RHC (9/14) with  mean RA 18, PA 62/25 mean 43, mean PCWP 26, CI 2.8, 70-80% distal LM stenosis, 80% ostial LAD stenosis, total occlusion RCA, SVG-PDA patent.  Patient had DES LM into LAD.  Plan to continue long-term ASA/Brilinta (poor responder to Plavix).  ETT-cardiolite (4/15) with 6' exercise, EF 60%, ST depression in recovery and mild chest pain, no ischemia or infarction by perfusion images.  Unstable angina 11/15 with 80% LM, patent SVG-RCA => DES to ostial LM.   - Lexiscan Cardiolite (2/18): EF 62%, no ischemia or infarction (normal).  She had a significant reaction to [place] and probably should not get again.  - Cardiolite (9/18): EF 72%, prior inferior infarct with mild peri-infarct ischemia.  -  LHC (9/18): 2 layers stent left main - ostial LAD with 50% dLM in-stent restenosis, 75+% ostial LCx, totally occluded RCA, SVG-RCA patent.  Medical management planned.  - ETT-Cardiolite (4/19): EF normal, 7'11" exercise, no ischemia or infarction.  - Lexiscan Cardiolite (8/20): No ischemia/infarction.  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  17. Anemia: Iron-deficiency.  Suspect chronic GI blood loss.  EGD in 9/14 was unremarkable.  Capsule endoscopy 10/14 was unremarkable.  Has required periodic blood transfusions.  18. Atrial fibrillation: Paroxysmal, noted by [facility] interrogation.  - Zio monitor 3/23: < 1% atrial fibrillation, longest run was 18 minutes.  19. Carotid stenosis: Carotid dopplers (2/15) with 40-59% bilateral stenosis. Carotid dopplers (3/16) with 40-59% bilateral ICA stenosis. Carotid dopplers (3/17) with 40-59% bilateral ICA stenosis.  - Carotid dopplers (3/18) with mild BICA stenosis.  20. 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  22. C difficile colitis 2017 23. Gout 24. ?TIA: head CT negative.  25. OSA: Moderate, uses CPAP.  26.  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 Hypercalcemia 28. 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  Mild-moderate on 9/21 echo.  Mild on 2/23 echo.  29. Tricuspid regurgitation: Moderate by 8/20 echo.  30. Lung nodules: Followed at [facility] => stable.  31. Essential tremor  Current Outpatient Medications  Medication Sig Dispense Refill   acyclovir ointment (ZOVIRAX) 5 % Apply 1 application topically every 3 (three) hours as needed (fever blister/cold sores).     ALPRAZolam (XANAX) 0.5 MG tablet Take 1 tablet (0.5 mg total) by mouth 2 (two) times daily as needed for anxiety. (Patient taking differently: Take 0.25 mg by mouth at bedtime as needed for anxiety.) 60 tablet 1   APIDRA 100 UNIT/ML injection Inject 40 Units into the skin as directed. INFUSE THROUGH INSULIN PUMP UTD, Bolus depends on carb intake  2   aspirin EC 81 MG tablet Take 81 mg by mouth at bedtime.     bisacodyl (DULCOLAX) 5 MG EC tablet Take 1 tablet (5 mg total) by mouth daily as needed for moderate constipation. 30 tablet 0   BRILINTA 60 MG TABS tablet TAKE ONE TABLET (60MG  TOTAL) BY MOUTH TWICE DAILY 180 tablet 3   cetirizine (ZYRTEC) 10 MG tablet Take 10 mg by mouth in the morning.     clobetasol (OLUX) 0.05 % topical foam Apply 1 application  topically 2 (two) times daily as needed (skin flaking (legs)).     Coenzyme Q10 (CO Q 10) 100 MG CAPS Take 100 mg by mouth at bedtime.      colchicine 0.6 MG tablet Take 0.6 mg by mouth daily as needed (gout flare).     denosumab (PROLIA) 60 MG/ML SOSY injection Inject 60 mg into the skin every 6 (six) months.     diclofenac Sodium (VOLTAREN) 1 % GEL Apply 2 g topically 4 (four) times daily as needed (pain.).     escitalopram (LEXAPRO) 5 MG tablet Take 5 mg by mouth at bedtime.     famotidine (PEPCID) 40 MG tablet Take 40 mg by mouth at bedtime. Takes a second tablet at night if needed.     gabapentin (NEURONTIN) 100 MG capsule Take 100 mg by mouth daily.     glucagon  (GLUCAGON EMERGENCY) 1 MG injection Inject 1 mg into the vein once as needed (low blood sugar).     isosorbide mononitrate (IMDUR) 120 MG  24 hr tablet TAKE ONE TABLET (120MG  TOTAL) BY MOUTH DAILY (Patient taking differently: Take 120 mg by mouth every evening.) 90 tablet 3   latanoprost (XALATAN) 0.005 % ophthalmic solution Place 1 drop into both eyes at bedtime.     metolazone (ZAROXOLYN) 2.5 MG tablet Take 2.5 mg (1 tab) once as directed by CHF clinic. Call before taking [phone] 3 tablet 0   mometasone (NASONEX) 50 MCG/ACT nasal spray Place 1 spray into the nose at bedtime.     mupirocin cream (BACTROBAN) 2 % Apply 1 application topically 2 (two) times daily as needed. 90 g 0   nitroGLYCERIN (NITROLINGUAL) 0.4 MG/SPRAY spray PLACE 1 SPRAY UNDER TH TONGUE EVERY 5 MINUTES FOR 3 DOSES AS NEEDED FOR CHEST PAIN 4.9 g 5   polyethylene glycol (MIRALAX / GLYCOLAX) 17 g packet Take 17 g by mouth in the morning. Hold for loose stools     pravastatin (PRAVACHOL) 10 MG tablet TAKE ONE TABLET (10 MG TOTAL) BY MOUTH DAILY. (Patient taking differently: Take 10 mg by mouth every evening.) 90 tablet 3   Probiotic Product (ALIGN PO) Take 1 tablet by mouth daily in the afternoon.     REPATHA SURECLICK 140 MG/ML SOAJ INJECT 140MG  INTO THE SKIN EVERY 14 DAYS 6 mL 3   spironolactone (ALDACTONE) 25 MG tablet TAKE ONE-HALF TABLET (12.5MG  TOTAL) BY MOUTH DAILY 45 tablet 0   torsemide (DEMADEX) 20 MG tablet Take 2 tablets (40 mg total) by mouth as directed. 20 mg alternating with 40 mg daily 48 tablet 8   ULORIC 40 MG tablet Take 1 tablet (40 mg total) by mouth at bedtime. 90 tablet 3   VASCEPA 1 g capsule TAKE TWO CAPSULES (2 GRAMS TOTAL) BY MOUTH TWO TIMES DAILY 360 capsule 3   metoprolol succinate (TOPROL XL) 25 MG 24 hr tablet Take 1 tablet (25 mg total) by mouth daily. 90 tablet 3   rOPINIRole (REQUIP) 0.25 MG tablet Take 1 tablet (0.25 mg total) by mouth at bedtime. Please call to schedule an overdue appointment  with Dr. Mayford Knife for refills, [phone], thank you. 1st attempt 30 tablet 0   silver nitrate applicators 75-25 % applicator Use as needed to stop superficial skin bleeding. 10 each 0   No current facility-administered medications for this encounter.    Allergies:   Avandia [rosiglitazone maleate], Cephalexin, Clindamycin, Humalog [insulin lispro], Lincomycin, Metformin, Novolog [insulin aspart], Penicillins, Tizanidine, Adhesive [tape], Atorvastatin, Cholestyramine, Gentamycin [gentamicin], Iodinated contrast media, Limonene, Prednisone, Ranexa [ranolazine], Rosuvastatin, Simvastatin, Azelastine, Clopidogrel, Kerendia [finerenone], Lisinopril, Codeine, Erythromycin, Hydrocodone-acetaminophen, Latex, Neomycin, Nickel, Ranitidine, Tamiflu [oseltamivir], and Tramadol   Social History:  The patient  reports that she has never smoked. She has never used smokeless tobacco. She reports that she does not drink alcohol and does not use drugs.   Family History:  The patient's family history includes Anal fissures in her mother; Breast cancer (age of onset: 42) in her sister; Cancer (age of onset: 47) in her sister; Cancer (age of onset: 17) in her maternal grandfather; Diabetes in her father; Glaucoma in her brother; Heart attack in her father, maternal grandmother, and mother; Heart disease in her father and mother; Hypertension in her brother; Lung cancer in her sister; Parkinson's disease in her paternal aunt.   ROS:  Please see the history of present illness.   All other systems are personally reviewed and negative.   Exam:   BP 118/82   Pulse 73   Wt 52.7 kg (116 lb 3.2 oz)  SpO2 98%   BMI 21.25 kg/m   PHYSICAL EXAM: General: NAD, frail Neck: No JVD, no thyromegaly or thyroid nodule.  Lungs: Clear to auscultation bilaterally with normal respiratory effort. CV: Nondisplaced PMI.  Heart regular S1/S2, no S3/S4, 2/6 SEM RUSB with fixed split S2.  No peripheral edema.  No carotid bruit.  Normal  pedal pulses.  Abdomen: Soft, nontender, no hepatosplenomegaly, mild distention.  Skin: Intact without lesions or rashes.  Neurologic: Alert and oriented x 3.  Psych: Normal affect. Extremities: No clubbing or cyanosis.  HEENT: Normal.   Wt Readings from Last 3 Encounters:  01/15/23 52.7 kg (116 lb 3.2 oz)  11/27/22 52.2 kg (115 lb)  10/26/22 53.9 kg (118 lb 12.8 oz)   ASSESSMENT AND PLAN: 1. Chronic diastolic CHF: Restrictive cardiomyopathy.  Most recent echo in 2/23 shows normal LV systolic function, normal RV systolic function but D-shaped septum suggesting RV pressure/volume overload, mild MS, normal bioprosthetic AoV, no evidence for pericardial constriction. RHC in 8/20 was concerning for right > left heart failure, she did appear to have equalization of diastolic pressures concerning for restrictive cardiomyopathy, possibly related to prior radiation. CPX in 11/20 showed severe HF limitation.  She was evaluated at [facility] for heart/kidney transplant for CKD and restrictive cardiomyopathy, but extensive vascular calcification makes her a poor surgical candidate. Balance of renal failure and RV failure has been difficult. RHC 1/24 showed elevated right heart filling pressures out of proportion to PCWP suggesting primarily RV failure. Mild PAH. Restrictive filling picture. Low cardiac index at 1.71 (RA 13, PA 43/15, mean 25, PCWP 12 with v waves to 20, CO 2.54, CI 1.71). Echo done at [facility] 12/23 showed LVEF 55%, AoV bioprosthesis functioning normal. RV systolic function reported to be normal. Moderate TR noted, no mention of mitral stenosis. Chronically NYHA Class III symptoms, worse recently.  Renal function also slowly worsening. Despite her symptoms, she does not appear significantly volume overloaded.   - Continue Torsemide 40 mg daily alternating with 20 mg daily. BMET today.  - Continue spironolactone 12.5 mg daily. Take qhs to limit BP drop during the day.   - No Jardiance/Farxiga given use  of insulin pump.   2. CAD: s/p SVG-RCA in 12/05, then redo SVG-RCA in 1/11--> distal left main/proximal LAD severe stenosis in 9/14.  PCI with DES to distal LM/proximal LAD was done rather than bypass as she would have been a poor candidate for redo sternotomy (2 prior sternotomies and history of chest wall radiation for Hodgkins disease).  Recurrent unstable angina with 80% ostial LM treated with repeat DES in 11/15.  Lexiscan Cardiolite in 9/18 inferior changes.  Repeat LHC (9/18) showed 50% distal left main in-stent restenosis and 75+% ostial LCx stenosis. I reviewed the films at the time with interventional cardiology => would be very difficult to intervene on the ostial LCx through 2 overlapping stents from left main into the LAD.  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 Given no ACS, we planned medical management and increased Imdur.  Cardiolite in 3/19 and again in 8/20 showed no ischemia or infarction.  No recent chest pain.  - Continue Brilinta 60 mg bid (poor responder to Plavix) long-term given recurrent left main disease.  - Continue ASA 81, pravastin/Repatha/Vascepa, check lipids.  - Continue Imdur 120 mg daily.  - Toprol XL was increased to 37.5 mg daily recently with palpitations, but PR interval is significantly lengthened.  Will decrease Toprol XL back to 25 mg  daily and may need to decrease further.  - She was unable to tolerate ranolazine.  3. Bioprosthetic aortic valve replacement: In setting of bicuspid aortic valve, thoracic aorta not dilated on 2017 TEE.  Valve looked ok on 2/23 echo.  Stable on Echo done at [facility] 12/23. 4. CKD:  Patient has a single kidney s/p right nephrectomy and suspected diabetic nephropathy.  CKD stage 4, slowly progressive. I have been concerned for cardiorenal syndrome in the setting of RV > LV failure/restrictive cardiomyopathy.  She has had a tremor as well as worsening fatigue and sleepiness.  I worry that her worsening  symptoms may be related to uremia.  - Make sure she has followup with nephrology soon.   - Not candidate for PD given prior abdominal surgery.  - If she needs HD, would try to aim for home HD.   - BMET today.  5. Hyperlipidemia: She has not been able to tolerate any higher potency statin than pravastatin 10 mg daily.   - Continue pravastatin.  - Continue Repatha.   - She is on Vascepa for elevated TGs.  - Lipids today.  6. Anemia: Chronic/slow GI bleed.  Unremarkable EGD and capsule endoscopy in fall 2014.  Unfortunately, needs to stay on Brilinta long-term (Plavix non-responder).   7. Atrial fibrillation: Paroxysmal, very brief episodes noted on PCM interrogation. Given GI bleeding and need to be on Brilinta, risks have been thought to outweigh benefits of anticoagulation. She is not a good Multaq candidate with CHF.  If she needs an antiarrhythmic, consider Tikosyn or Sotalol but renal function may make these meds difficult to use.  Zio monitor in 3/23 showed < 1% AF, longest run 18 minutes.  - Cut back on Toprol XL as above with long 1st degree AVB.  - We were unable to place Watchman device due to nickel allergy.   8. Carotid bruit: Only mild stenosis on last dopplers in 3/18.  9. SSS: Pacemaker in place.  10. HTN: BP controlled.    11. OSA: Continue ASV.  12. ?TIA: Transient dysarthria in 3/18, could have been afib-related TIA.  No recurrent symptoms.  13. Thrombocytopenia: Chronic and mild. Follows with hematology.  14. 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  Mild on 2/23 echo.  She would not be a candidate for valve replacement.  15. Hematology: MGUS noted, has seen hematology.  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  EGD 07/26/22 negative for varices - Given tremor with fatigue and lethargy, check NH3.   Follow-up 2 months.   Signed, Marca Ancona, MD  01/15/2023  [facility] [address] [facility] [place] [address] [phone] (office) [phone] (fax)

## 2023-01-17 ENCOUNTER — Other Ambulatory Visit (HOSPITAL_COMMUNITY): Payer: Self-pay | Admitting: Cardiology

## 2023-01-24 DIAGNOSIS — E1021 Type 1 diabetes mellitus with diabetic nephropathy: Secondary | ICD-10-CM | POA: Diagnosis not present

## 2023-01-25 ENCOUNTER — Encounter (HOSPITAL_COMMUNITY): Payer: Self-pay | Admitting: Cardiology

## 2023-01-28 DIAGNOSIS — G4731 Primary central sleep apnea: Secondary | ICD-10-CM | POA: Diagnosis not present

## 2023-01-30 NOTE — Progress Notes (Signed)
Remote pacemaker transmission.   

## 2023-02-05 DIAGNOSIS — E1021 Type 1 diabetes mellitus with diabetic nephropathy: Secondary | ICD-10-CM | POA: Diagnosis not present

## 2023-02-07 ENCOUNTER — Other Ambulatory Visit (HOSPITAL_COMMUNITY): Payer: Self-pay

## 2023-02-07 MED ORDER — TORSEMIDE 20 MG PO TABS: 40.0000 mg | ORAL_TABLET | ORAL | 11 refills | Status: DC

## 2023-02-08 ENCOUNTER — Ambulatory Visit (INDEPENDENT_AMBULATORY_CARE_PROVIDER_SITE_OTHER): Payer: Medicare PPO | Admitting: Pulmonary Disease

## 2023-02-08 ENCOUNTER — Encounter: Payer: Self-pay | Admitting: Pulmonary Disease

## 2023-02-08 VITALS — BP 101/62 | HR 73 | Ht 62.0 in | Wt 119.0 lb

## 2023-02-08 DIAGNOSIS — G4731 Primary central sleep apnea: Secondary | ICD-10-CM

## 2023-02-08 DIAGNOSIS — G250 Essential tremor: Secondary | ICD-10-CM | POA: Insufficient documentation

## 2023-02-08 DIAGNOSIS — G4733 Obstructive sleep apnea (adult) (pediatric): Secondary | ICD-10-CM

## 2023-02-08 NOTE — Progress Notes (Signed)
   Subjective:    Patient ID: [name] [name], female    DOB: Oct 12, 1950, 72 y.o.   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    Initial OV 10/2022 >> BiPAP settings are 16/12 with a fullface mask and download shows residual events 63/hour which are scored as obstructive 29/hour and central 34/hour   The nature of her sleep disordered breathing seems to have changed over time. She mostly had obstructive apneas on her baseline study in 2018 but study in 2024 shows mainly central apneas and Cheyne-Stokes breathing. This may relate to worsening heart failure over time. Seems like initially she required CPAP and surprisingly on a titration in 2023 she required low levels of BiPAP        PMH: CAD s/p CABG in 12/05 and redo in 1/11, bioprosthetic AVR,  diastolic CHF, and  sick sinus syndrome s/p PPM  -chronic iron deficiency anemia thought to be due to a slow GI bleed.  Last EGD was unremarkable and a capsule endoscopy was also unremarkable.  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 Feels more rested on waking up Renal function is worsening , BUN /cr upto 90/3.0 & HD is planned. Weight is at baseline, she reports abdominal distension  Significant tests/ events reviewed  07/2022 NPSG >> AHI 63/h, CAI 50/h 08/2022 Bipap Titration study >> 110 lbs -bipap ST16/12 with back up RR 14, central apneas eliminated at his pressure, small FF mask   05/2022 BiPAP titration>> 9/5 cm   05/2019 BiPAP  titration 20/16 cm   12/2016 CPAP titration 18 cm 10/2016 NPSG >> AHI 22/hour predominantly obstructive events   Review of Systems neg for any significant sore throat, dysphagia, itching, sneezing, nasal congestion or excess/ purulent secretions, fever, chills, sweats, unintended wt loss, pleuritic or exertional cp, hempoptysis, orthopnea pnd or change in chronic leg swelling. Also denies presyncope, palpitations, heartburn, abdominal pain, nausea, vomiting, diarrhea or change in bowel or urinary habits, dysuria,hematuria, rash, arthralgias, visual complaints, headache, numbness weakness or ataxia.     Objective:   Physical Exam  Gen. Pleasant,thin,frail, in no distress ENT - no thrush, no pallor/icterus,no post nasal drip Neck: No JVD, no thyromegaly, no carotid bruits Lungs: no use of accessory muscles, no dullness to percussion, clear without rales or rhonchi  Cardiovascular: Rhythm regular, heart sounds  normal, no murmurs or gallops, no peripheral edema Musculoskeletal: No deformities, no cyanosis or clubbing        Assessment & Plan:

## 2023-02-08 NOTE — Assessment & Plan Note (Signed)
DL report viewed on her phone On ASV mode , events have decreased to 10/h last week & about 6/h this week She ha a large leak which has resolved after changing mask last few nights. She appears remarkably improved, compliance is 10 h/ night & she wakes up refreshed. ASV is definitely helping. EF is normal  compliance with goal of at least 6 hrs every night is the expectation. Advised against medications with sedative side effects Cautioned against driving when sleepy - understanding that sleepiness will vary on a day to day basis

## 2023-02-08 NOTE — Assessment & Plan Note (Signed)
Not many obstructive events on DL

## 2023-02-08 NOTE — Patient Instructions (Signed)
ASV machine is working well I will review report  We will renew supplies as needed

## 2023-02-13 DIAGNOSIS — M533 Sacrococcygeal disorders, not elsewhere classified: Secondary | ICD-10-CM | POA: Diagnosis not present

## 2023-02-13 DIAGNOSIS — G20C Parkinsonism, unspecified: Secondary | ICD-10-CM | POA: Diagnosis not present

## 2023-02-13 DIAGNOSIS — M79601 Pain in right arm: Secondary | ICD-10-CM | POA: Diagnosis not present

## 2023-02-13 DIAGNOSIS — E10319 Type 1 diabetes mellitus with unspecified diabetic retinopathy without macular edema: Secondary | ICD-10-CM | POA: Diagnosis not present

## 2023-02-15 DIAGNOSIS — H401131 Primary open-angle glaucoma, bilateral, mild stage: Secondary | ICD-10-CM | POA: Diagnosis not present

## 2023-02-15 DIAGNOSIS — H04123 Dry eye syndrome of bilateral lacrimal glands: Secondary | ICD-10-CM | POA: Diagnosis not present

## 2023-02-16 ENCOUNTER — Encounter (HOSPITAL_COMMUNITY): Payer: Self-pay | Admitting: Cardiology

## 2023-02-16 ENCOUNTER — Other Ambulatory Visit (HOSPITAL_COMMUNITY): Payer: Self-pay | Admitting: Cardiology

## 2023-02-16 DIAGNOSIS — I6523 Occlusion and stenosis of bilateral carotid arteries: Secondary | ICD-10-CM

## 2023-02-18 ENCOUNTER — Ambulatory Visit: Payer: Medicare PPO | Admitting: Family Medicine

## 2023-02-19 ENCOUNTER — Telehealth (HOSPITAL_COMMUNITY): Payer: Self-pay

## 2023-02-19 NOTE — Telephone Encounter (Signed)
Patient advised and verbalized understanding 

## 2023-02-19 NOTE — Telephone Encounter (Signed)
Unusually high for her.  Keep track of BP over the next few days and call in readings.

## 2023-02-19 NOTE — Telephone Encounter (Addendum)
Patient called to report that today her blood pressure was 154/102.She also feels weak and fatigue. Yesterday she states she couldn't walk a striaght line, kept walking to the right, after 30 minutes she felt fine. Denies nausea/vomiting. Her Weight is 115 today,she is down 3 pounds since Sunday,

## 2023-02-20 ENCOUNTER — Other Ambulatory Visit: Payer: Self-pay | Admitting: *Deleted

## 2023-02-20 ENCOUNTER — Encounter (HOSPITAL_COMMUNITY): Payer: Self-pay | Admitting: Cardiology

## 2023-02-20 DIAGNOSIS — N184 Chronic kidney disease, stage 4 (severe): Secondary | ICD-10-CM

## 2023-02-23 DIAGNOSIS — E1021 Type 1 diabetes mellitus with diabetic nephropathy: Secondary | ICD-10-CM | POA: Diagnosis not present

## 2023-02-26 ENCOUNTER — Other Ambulatory Visit (HOSPITAL_COMMUNITY): Payer: Self-pay | Admitting: Cardiology

## 2023-02-26 DIAGNOSIS — M7521 Bicipital tendinitis, right shoulder: Secondary | ICD-10-CM | POA: Diagnosis not present

## 2023-02-27 DIAGNOSIS — L089 Local infection of the skin and subcutaneous tissue, unspecified: Secondary | ICD-10-CM | POA: Diagnosis not present

## 2023-02-28 DIAGNOSIS — G4731 Primary central sleep apnea: Secondary | ICD-10-CM | POA: Diagnosis not present

## 2023-03-04 ENCOUNTER — Encounter (HOSPITAL_COMMUNITY): Payer: Medicare PPO | Admitting: Cardiology

## 2023-03-05 DIAGNOSIS — E1021 Type 1 diabetes mellitus with diabetic nephropathy: Secondary | ICD-10-CM | POA: Diagnosis not present

## 2023-03-18 ENCOUNTER — Ambulatory Visit: Payer: Medicare PPO | Admitting: Surgery

## 2023-03-18 ENCOUNTER — Ambulatory Visit (INDEPENDENT_AMBULATORY_CARE_PROVIDER_SITE_OTHER)
Admission: RE | Admit: 2023-03-18 | Discharge: 2023-03-18 | Disposition: A | Discharge status: Home / Self Care | Payer: Medicare PPO | Source: Ambulatory Visit | Attending: Surgery | Admitting: Surgery

## 2023-03-18 ENCOUNTER — Encounter: Payer: Self-pay | Admitting: Surgery

## 2023-03-18 ENCOUNTER — Ambulatory Visit (HOSPITAL_COMMUNITY)
Admission: RE | Admit: 2023-03-18 | Discharge: 2023-03-18 | Disposition: A | Discharge status: Home / Self Care | Payer: Medicare PPO | Source: Ambulatory Visit | Attending: Surgery | Admitting: Surgery

## 2023-03-18 VITALS — BP 126/72 | HR 65 | Temp 98.0°F | Resp 20 | Ht 62.0 in | Wt 115.0 lb

## 2023-03-18 DIAGNOSIS — N184 Chronic kidney disease, stage 4 (severe): Secondary | ICD-10-CM | POA: Insufficient documentation

## 2023-03-18 NOTE — Progress Notes (Signed)
[facility] [place]  Patient name: [name] [name] MRN: [id number] DOB: 02/02/1951 Sex: female   REQUESTING PROVIDER:    Dr. Signe Colt   REASON FOR CONSULT:    Dialysis access  HISTORY OF PRESENT ILLNESS:   [name] [name] is a 72 y.o. female, who is referred for dialysis access.  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  She is a diabetic.  She has undergone right radical nephrectomy for renal cell cancer in 2008.  She has a history of CABG x 2 in 2005 and 2011.  She has a left-sided pacemaker.  She is on Brilinta.  She also has a history of lymphoma dating back to 73.  She has NASH with cirrhosis.  She has been declined her renal heart transplant at [facility].  She has a porcelain aorta  Her creatinine has been increasing up into the 3 range.  She will occasionally get dizzy.  She is right-handed  PAST MEDICAL HISTORY    Past Medical History:  Diagnosis Date   Anxiety    Aortic stenosis    a. bicuspid aortic valve; mean gradient of 20 mmHg in 2/10; b. s/p bioprosth AVR (19-mm Edwards pericardial valve) with a redo coronary artery bypass graft procedure in 07/2009; postoperative Dressler's syndrome     Arthritis    Breast cancer (HCC)    Carcinoma, renal cell (HCC) 05/2007   Laparoscopic right nephrectomy   Chronic diastolic CHF (congestive heart failure) (HCC)    a. 9.2014 EchoP EF 60-65%, no rwma, bioprosth AVR, mean gradient of , mild to mod MR, PASP .   CKD (chronic kidney disease), stage IV (HCC)    Colitis, ischemic (HCC) 2008   Coronary artery disease    a. initial CABG with SVG-RCA in 12/05. b. redo SVG-RCA and bioprosthetic AVR in 1/11. d. 9/14 - PCI with DES to distal LM/proximal LAD was done rather than bypass as she would have been a poor candidate for redo sternotomy. d. S/p DES to prox LM 05/2014.   Diabetes mellitus 03/2010    TYPE II: Hemoglobin A1c of 7.4 in  03/2010; 8.4 in 06/2010; treated with  insulin pump   Duodenal ulcer    Remote; H. pylori positive   Gastroparesis due to DM (HCC) 05/01/2012   Glaucoma 1998   Hodgkin's disease (HCC) 1991   Mantle radiation therapy   Hyperlipidemia    Hyperplastic gastric polyp 06/23/2012   Hypertension    Iron deficiency anemia    a. 03/2013 EGD: essentially normal. Possible slow GIB.   Migraines    NASH (nonalcoholic steatohepatitis) 1999   -biopsy in 1999   OSA treated with BiPAP    Osteoporosis    Pacemaker    six sinus syndrome   PAF (paroxysmal atrial fibrillation) (HCC)    a.  h/o brief episodes noted on ppm interrogation. b. given GI bleeding and need to be on both ASA 81 and Brilinta, risks likely outweigh benefits of anticoagulation.    Small bowel obstruction (HCC)    Recurrent; resolved after resection of a lipoma   Syncope    a. H/o recurrent syncope with pauses on loop recorder s/p Medtronic pacemaker 07/2012.     FAMILY HISTORY   Family History  Problem Relation Age of Onset   Heart attack Mother        d.61   Heart disease Mother    Anal fissures Mother    Heart attack Father        d.60  Diabetes Father    Heart disease Father    Lung cancer Sister        d.61 metastases to brain. History of smoking. Maternal half-sister.   Cancer Sister 30       unspecified gynecologic cancer (either cervical or ovarian) in her 30s.   Breast cancer Sister 38       d.42 from metastatic disease. Paternal half-sister.   Hypertension Brother    Glaucoma Brother    Parkinson's disease Paternal Aunt    Heart attack Maternal Grandmother    Cancer Maternal Grandfather 50       d.52s oral cancer   Colon cancer Neg Hx    Stomach cancer Neg Hx    Parkinsonism Neg Hx     SOCIAL HISTORY:   Social History   Socioeconomic History   Marital status: Married    Spouse name: Not on file   Number of children: 2   Years of education: Not on file   Highest education level: Doctorate  Occupational History   Occupation:  retired    Comment: elementary principal  Tobacco Use   Smoking status: Never   Smokeless tobacco: Never  Vaping Use   Vaping status: Never Used  Substance and Sexual Activity   Alcohol use: No    Comment: social, 25s   Drug use: Never   Sexual activity: Yes  Other Topics Concern   Not on file  Social History Narrative   Retired Programmer, multimedia principal, married    Started disability September 2013      Has one daughter in [place], works at [employer]. Has a son, Arlys John, who is Press photographer.   Eats all food groups.   Wear seatbelt.    Attends church. [place], [place]. [employer].    Social Determinants of Health   Financial Resource Strain: Low Risk  (01/02/2023)   Received from [facility], [facility]   Overall Financial Resource Strain (CARDIA)    Difficulty of Paying Living Expenses: Not hard at all  Food Insecurity: No Food Insecurity (01/02/2023)   Received from [facility], [facility]   Hunger Vital Sign    Worried About Running Out of Food in the Last Year: Never true    Ran Out of Food in the Last Year: Never true  Transportation Needs: No Transportation Needs (01/02/2023)   Received from [facility], [facility]   [facility] - Transportation    In the past 12 months, has lack of transportation kept you from medical appointments or from getting medications?: No    Lack of Transportation (Non-Medical): No  Physical Activity: Insufficiently Active (03/17/2021)   Received from [facility]   Exercise Vital Sign  Stress: Stress Concern Present (03/17/2021)   Received from [facility], [facility]   [employer] of Occupational [facility] - Occupational Stress Questionnaire    Feeling of Stress : To some extent  Social Connections: Not on file  Intimate Partner Violence: Not on file    ALLERGIES:    Allergies   Allergen Reactions   Avandia [Rosiglitazone Maleate] Other (See Comments)    Congestive heart failure   Cephalexin Anaphylaxis, Swelling and Rash    Extremities swell , throat swelling    Clindamycin Anaphylaxis and Shortness Of Breath    Other reaction(s): esophageal spasms   Humalog [Insulin Lispro] Itching     big knots and whelp  Lincomycin Anaphylaxis and Shortness Of Breath   Metformin Other (See Comments)    Congestive heart failure   Novolog [Insulin Aspart] Hives and Itching    Humalog & Novolog big knots and whelp     Penicillins Anaphylaxis, Hives, Swelling and Rash    Did it involve swelling of the face/tongue/throat, SOB, or low BP? Yes Did it involve sudden or severe rash/hives, skin peeling, or any reaction on the inside of your mouth or nose? No Did you need to seek medical attention at a hospital or doctor's office? Yes When did it last happen?      Childhood allergy  If all above answers are "NO", may proceed with cephalosporin use.    Tizanidine Other (See Comments)    Dizziness, Mental Status Changes, Hallucination     Adhesive [Tape] Other (See Comments)    Tears skin, Please use "paper" tape   Atorvastatin Other (See Comments)    increased LFT's, no muscle weakness, leg pain Muscle and joint pain increased LFT's  Increased LFTs   Cholestyramine Other (See Comments)    Liver Disorder Elevated LFTs     Gentamycin [Gentamicin] Hives, Itching and Rash    Fine red bumps.  Reaction noted post loop recorder implant, where gentamycin was used for irrigation. Topical prep   Iodinated Contrast Media Other (See Comments)    PAIN DURING LYMPHANGIOGRAM '81, NO ALLERGY TO IV CONTRAST.  Has had procedures with Iodine since without problems.    Limonene Hives, Itching, Rash and Other (See Comments)    Reaction noted post loop recorder implant, where gentamycin was used for irrigation. Topical prep   Prednisone Itching and Other (See Comments)    B/P went  high, itching all over.  Tolerates with benadryl    Ranexa [Ranolazine] Other (See Comments)    Nausea, dizziness, low blood sugar, tingling in right hand, "blood blisters" on tongue    Rosuvastatin Nausea And Vomiting, Nausea Only and Other (See Comments)    Muscle and joint pain & increased LFTs; tolerates Pravastatin 10mg  (max) Other reaction(s): elevated LFTs   Simvastatin Other (See Comments)    Increased LFT's    Azelastine Other (See Comments)    Other reaction(s): metallic taste,spitting up blood   Clopidogrel Other (See Comments)    Does not work for patient Medicine was not effective Other reaction(s): Unknown   Kerendia [Finerenone] Hives and Nausea Only   Lisinopril Diarrhea, Nausea Only and Other (See Comments)   Codeine Nausea And Vomiting   Erythromycin Nausea And Vomiting   Hydrocodone-Acetaminophen Itching, Rash and Other (See Comments)    itching   Latex Itching and Other (See Comments)    I"if wear gloves hands get red ,itchy no prioblem if other wear and touch me"   Neomycin Rash and Other (See Comments)    Redness   Nickel Itching   Ranitidine Nausea Only   Tamiflu [Oseltamivir] Nausea And Vomiting   Tramadol Nausea Only    CURRENT MEDICATIONS:    Current Outpatient Medications  Medication Sig Dispense Refill   acyclovir ointment (ZOVIRAX) 5 % Apply 1 application topically every 3 (three) hours as needed (fever blister/cold sores).     ALPRAZolam (XANAX) 0.5 MG tablet Take 1 tablet (0.5 mg total) by mouth 2 (two) times daily as needed for anxiety. (Patient taking differently: Take 0.25 mg by mouth at bedtime as needed for anxiety.) 60 tablet 1   APIDRA 100 UNIT/ML injection Inject 40 Units into the skin as directed. INFUSE THROUGH  INSULIN PUMP UTD, Bolus depends on carb intake  2   aspirin EC 81 MG tablet Take 81 mg by mouth at bedtime.     BRILINTA 60 MG TABS tablet TAKE ONE TABLET (60MG  TOTAL) BY MOUTH TWICE DAILY 180 tablet 3   cetirizine (ZYRTEC) 10  MG tablet Take 10 mg by mouth in the morning.     clobetasol (OLUX) 0.05 % topical foam Apply 1 application  topically 2 (two) times daily as needed (skin flaking (legs)).     Coenzyme Q10 (CO Q 10) 100 MG CAPS Take 100 mg by mouth at bedtime.      colchicine 0.6 MG tablet Take 0.6 mg by mouth daily as needed (gout flare).     denosumab (PROLIA) 60 MG/ML SOSY injection Inject 60 mg into the skin every 6 (six) months.     diclofenac Sodium (VOLTAREN) 1 % GEL Apply 2 g topically 4 (four) times daily as needed (pain.).     escitalopram (LEXAPRO) 5 MG tablet Take 5 mg by mouth at bedtime.     famotidine (PEPCID) 40 MG tablet Take 40 mg by mouth at bedtime. Takes a second tablet at night if needed.     gabapentin (NEURONTIN) 100 MG capsule Take 100 mg by mouth daily.     glucagon (GLUCAGON EMERGENCY) 1 MG injection Inject 1 mg into the vein once as needed (low blood sugar).     isosorbide mononitrate (IMDUR) 120 MG 24 hr tablet TAKE ONE TABLET (120MG  TOTAL) BY MOUTH DAILY 90 tablet 3   latanoprost (XALATAN) 0.005 % ophthalmic solution Place 1 drop into both eyes at bedtime.     metolazone (ZAROXOLYN) 2.5 MG tablet Take 2.5 mg (1 tab) once as directed by CHF clinic. Call before taking [phone] 3 tablet 0   metoprolol succinate (TOPROL XL) 25 MG 24 hr tablet Take 1 tablet (25 mg total) by mouth daily. 90 tablet 3   mometasone (NASONEX) 50 MCG/ACT nasal spray Place 1 spray into the nose at bedtime.     mupirocin cream (BACTROBAN) 2 % Apply 1 application topically 2 (two) times daily as needed. 90 g 0   nitroGLYCERIN (NITROLINGUAL) 0.4 MG/SPRAY spray PLACE 1 SPRAY UNDER TH TONGUE EVERY 5 MINUTES FOR 3 DOSES AS NEEDED FOR CHEST PAIN 4.9 g 5   polyethylene glycol (MIRALAX / GLYCOLAX) 17 g packet Take 17 g by mouth in the morning. Hold for loose stools     pravastatin (PRAVACHOL) 10 MG tablet TAKE ONE TABLET (10 MG TOTAL) BY MOUTH DAILY. (Patient taking differently: Take 10 mg by mouth every evening.) 90  tablet 3   Probiotic Product (ALIGN PO) Take 1 tablet by mouth daily in the afternoon.     REPATHA SURECLICK 140 MG/ML SOAJ INJECT 140MG  INTO THE SKIN EVERY 14 DAYS 6 mL 3   silver nitrate applicators 75-25 % applicator Use as needed to stop superficial skin bleeding. 10 each 0   spironolactone (ALDACTONE) 25 MG tablet TAKE ONE-HALF TABLET (12.5MG  TOTAL) BY MOUTH DAILY 45 tablet 0   torsemide (DEMADEX) 20 MG tablet Take 2 tablets (40 mg total) by mouth as directed. 20 mg alternating with 40 mg daily 42 tablet 11   ULORIC 40 MG tablet Take 1 tablet (40 mg total) by mouth at bedtime. 90 tablet 3   VASCEPA 1 g capsule TAKE TWO CAPSULES (2 GRAMS TOTAL) BY MOUTH TWO TIMES DAILY 360 capsule 3   No current facility-administered medications for this visit.    REVIEW  OF SYSTEMS:   [X]  denotes positive finding, [ ]  denotes negative finding Cardiac  Comments:  Chest pain or chest pressure:    Shortness of breath upon exertion:    Short of breath when lying flat:    Irregular heart rhythm:        Vascular    Pain in calf, thigh, or hip brought on by ambulation:    Pain in feet at night that wakes you up from your sleep:     Blood clot in your veins:    Leg swelling:         Pulmonary    Oxygen at home:    Productive cough:     Wheezing:         Neurologic    Sudden weakness in arms or legs:     Sudden numbness in arms or legs:     Sudden onset of difficulty speaking or slurred speech:    Temporary loss of vision in one eye:     Problems with dizziness:         Gastrointestinal    Blood in stool:      Vomited blood:         Genitourinary    Burning when urinating:     Blood in urine:        Psychiatric    Major depression:         Hematologic    Bleeding problems:    Problems with blood clotting too easily:        Skin    Rashes or ulcers:        Constitutional    Fever or chills:     PHYSICAL EXAM:   Vitals:   03/18/23 1133  BP: 126/72  Pulse: 65  Resp: 20   Temp: 98 F (36.7 C)  SpO2: 92%  Weight: 115 lb (52.2 kg)  Height: 5\' 2"  (1.575 m)    GENERAL: The patient is a well-nourished female, in no acute distress. The vital signs are documented above. CARDIAC: There is a regular rate and rhythm.  VASCULAR: Palpable radial pulses PULMONARY: Nonlabored respirations MUSCULOSKELETAL: There are no major deformities or cyanosis. NEUROLOGIC: No focal weakness or paresthesias are detected. SKIN: There are no ulcers or rashes noted. PSYCHIATRIC: The patient has a normal affect.  STUDIES:   I have reviewed the followingL Right Cephalic   Diameter (cm)Depth (cm)Findings  +-----------------+-------------+----------+--------+  Shoulder            0.07        0.31             +-----------------+-------------+----------+--------+  Prox upper arm       0.07        0.27             +-----------------+-------------+----------+--------+  Mid upper arm        0.08        0.30             +-----------------+-------------+----------+--------+  Dist upper arm       0.12        0.32             +-----------------+-------------+----------+--------+  Antecubital fossa    0.24        0.27             +-----------------+-------------+----------+--------+  Prox forearm         0.07        0.46             +-----------------+-------------+----------+--------+  Mid forearm          0.07        0.43             +-----------------+-------------+----------+--------+  Dist forearm         0.08        0.26             +-----------------+-------------+----------+--------+   +-----------------+-------------+----------+--------------+  Right Basilic    Diameter (cm)Depth (cm)   Findings     +-----------------+-------------+----------+--------------+  Prox upper arm       0.34        1.16                   +-----------------+-------------+----------+--------------+  Mid upper arm        0.29        1.16                    +-----------------+-------------+----------+--------------+  Dist upper arm       0.22        0.67     branching     +-----------------+-------------+----------+--------------+  Antecubital fossa    0.16        0.24                   +-----------------+-------------+----------+--------------+  Prox forearm         0.17        0.17     branching     +-----------------+-------------+----------+--------------+  Mid forearm                             not visualized  +-----------------+-------------+----------+--------------+  Distal forearm                          not visualized  +-----------------+-------------+----------+--------------+  Wrist                                  not visualized  +-----------------+-------------+----------+--------------+   +-----------------+-------------+----------+--------+  Left Cephalic    Diameter (cm)Depth (cm)Findings  +-----------------+-------------+----------+--------+  Shoulder            0.15        0.57             +-----------------+-------------+----------+--------+  Prox upper arm       0.16        0.40             +-----------------+-------------+----------+--------+  Mid upper arm        0.17        0.28             +-----------------+-------------+----------+--------+  Dist upper arm       0.19        0.30             +-----------------+-------------+----------+--------+  Antecubital fossa    0.24        0.28             +-----------------+-------------+----------+--------+  Prox forearm         0.14        0.49             +-----------------+-------------+----------+--------+  Mid forearm          0.11        0.31             +-----------------+-------------+----------+--------+  Dist forearm         0.10        0.26             +-----------------+-------------+----------+--------+  Wrist               0.13        0.24              +-----------------+-------------+----------+--------+   +-----------------+-------------+----------+--------------+  Left Basilic     Diameter (cm)Depth (cm)   Findings     +-----------------+-------------+----------+--------------+  Prox upper arm       0.19        0.98                   +-----------------+-------------+----------+--------------+  Mid upper arm        0.14        0.63                   +-----------------+-------------+----------+--------------+  Dist upper arm       0.14        0.56                   +-----------------+-------------+----------+--------------+  Antecubital fossa    0.10        0.42                   +-----------------+-------------+----------+--------------+  Prox forearm                            not visualized  +-----------------+-------------+----------+--------------+  Mid forearm                             not visualized  +-----------------+-------------+----------+--------------+  Distal forearm                          not visualized  +-----------------+-------------+----------+--------------+  Elbow                                  not visualized  +-----------------+-------------+----------+--------------+  Wrist                                  not visualized  +-----------------+-------------+----------+--------------+    ASSESSMENT and PLAN   CKD4: The patient surface veins are too small for fistula creation.  She is right-handed but has a left-sided pacemaker and so I would recommend a right upper arm graft.  I discussed the risks of the operation include the risk of steal, and the need for future interventions as well as graft failure.  I told her that I would consider placing a fistula in the operating room if her veins appeared to be larger than what we saw today.  I will wait until I get Dr. Garlon Hatchet approval to proceed with a graft before placing this.  Patient is scheduled to see her  next week.  Prior to any procedure, she would need to be off of her Brilinta for a week   Durene Cal, IV, MD, FACS [facility] [phone] Pager [phone]

## 2023-03-25 DIAGNOSIS — E1021 Type 1 diabetes mellitus with diabetic nephropathy: Secondary | ICD-10-CM | POA: Diagnosis not present

## 2023-03-28 ENCOUNTER — Other Ambulatory Visit (HOSPITAL_COMMUNITY): Payer: Self-pay | Admitting: Cardiology

## 2023-03-28 DIAGNOSIS — S46101D Unspecified injury of muscle, fascia and tendon of long head of biceps, right arm, subsequent encounter: Secondary | ICD-10-CM | POA: Diagnosis not present

## 2023-03-31 DIAGNOSIS — G4731 Primary central sleep apnea: Secondary | ICD-10-CM | POA: Diagnosis not present

## 2023-04-01 DIAGNOSIS — D0512 Intraductal carcinoma in situ of left breast: Secondary | ICD-10-CM | POA: Diagnosis not present

## 2023-04-01 DIAGNOSIS — B1910 Unspecified viral hepatitis B without hepatic coma: Secondary | ICD-10-CM | POA: Diagnosis not present

## 2023-04-01 DIAGNOSIS — Z952 Presence of prosthetic heart valve: Secondary | ICD-10-CM | POA: Diagnosis not present

## 2023-04-01 DIAGNOSIS — N184 Chronic kidney disease, stage 4 (severe): Secondary | ICD-10-CM | POA: Diagnosis not present

## 2023-04-01 DIAGNOSIS — E1122 Type 2 diabetes mellitus with diabetic chronic kidney disease: Secondary | ICD-10-CM | POA: Diagnosis not present

## 2023-04-01 DIAGNOSIS — S46101D Unspecified injury of muscle, fascia and tendon of long head of biceps, right arm, subsequent encounter: Secondary | ICD-10-CM | POA: Diagnosis not present

## 2023-04-01 DIAGNOSIS — I509 Heart failure, unspecified: Secondary | ICD-10-CM | POA: Diagnosis not present

## 2023-04-02 ENCOUNTER — Ambulatory Visit (INDEPENDENT_AMBULATORY_CARE_PROVIDER_SITE_OTHER): Payer: Medicare PPO

## 2023-04-02 DIAGNOSIS — I495 Sick sinus syndrome: Secondary | ICD-10-CM | POA: Diagnosis not present

## 2023-04-03 LAB — CUP PACEART REMOTE DEVICE CHECK
Battery Impedance: 1514 Ohm
Battery Remaining Longevity: 52 mo
Battery Voltage: 2.78 V
Brady Statistic AP VP Percent: 0 %
Brady Statistic AP VS Percent: 1 %
Brady Statistic AS VP Percent: 0 %
Brady Statistic AS VS Percent: 99 %
Date Time Interrogation Session: 20240910134533
Implantable Lead Connection Status: 753985
Implantable Lead Connection Status: 753985
Implantable Lead Implant Date: 20140130
Implantable Lead Implant Date: 20140130
Implantable Lead Location: 753859
Implantable Lead Location: 753860
Implantable Lead Model: 5076
Implantable Lead Model: 5076
Implantable Pulse Generator Implant Date: 20140130
Lead Channel Impedance Value: 441 Ohm
Lead Channel Impedance Value: 599 Ohm
Lead Channel Pacing Threshold Amplitude: 0.5 V
Lead Channel Pacing Threshold Amplitude: 0.75 V
Lead Channel Pacing Threshold Pulse Width: 0.4 ms
Lead Channel Pacing Threshold Pulse Width: 0.4 ms
Lead Channel Setting Pacing Amplitude: 1.5 V
Lead Channel Setting Pacing Amplitude: 2.5 V
Lead Channel Setting Pacing Pulse Width: 0.4 ms
Lead Channel Setting Sensing Sensitivity: 5.6 mV
Zone Setting Status: 755011
Zone Setting Status: 755011

## 2023-04-04 ENCOUNTER — Other Ambulatory Visit: Payer: Self-pay

## 2023-04-04 ENCOUNTER — Telehealth: Payer: Self-pay

## 2023-04-04 DIAGNOSIS — S46101D Unspecified injury of muscle, fascia and tendon of long head of biceps, right arm, subsequent encounter: Secondary | ICD-10-CM | POA: Diagnosis not present

## 2023-04-04 DIAGNOSIS — N184 Chronic kidney disease, stage 4 (severe): Secondary | ICD-10-CM

## 2023-04-04 NOTE — Telephone Encounter (Signed)
Received a call from Joann at [facility]Dr. Signe Colt stating patient is ready to start dialysis and request to proceed with AVG placement, in addition to a [facility] placement.   Spoke with patient regarding scheduling. Patient states she would like to schedule after 9/21. Offered patient several dates- she chose to schedule on 10/3. Instructions provided, including to hold Brilinta 5 days prior and continue ASA. Patient denies any allergies to IV contrast or the need for premed.

## 2023-04-09 DIAGNOSIS — I10 Essential (primary) hypertension: Secondary | ICD-10-CM | POA: Diagnosis not present

## 2023-04-09 DIAGNOSIS — K7460 Unspecified cirrhosis of liver: Secondary | ICD-10-CM | POA: Diagnosis not present

## 2023-04-09 DIAGNOSIS — I350 Nonrheumatic aortic (valve) stenosis: Secondary | ICD-10-CM | POA: Diagnosis not present

## 2023-04-09 DIAGNOSIS — K219 Gastro-esophageal reflux disease without esophagitis: Secondary | ICD-10-CM | POA: Diagnosis not present

## 2023-04-09 DIAGNOSIS — I480 Paroxysmal atrial fibrillation: Secondary | ICD-10-CM | POA: Diagnosis not present

## 2023-04-09 DIAGNOSIS — I2510 Atherosclerotic heart disease of native coronary artery without angina pectoris: Secondary | ICD-10-CM | POA: Diagnosis not present

## 2023-04-09 DIAGNOSIS — C649 Malignant neoplasm of unspecified kidney, except renal pelvis: Secondary | ICD-10-CM | POA: Diagnosis not present

## 2023-04-09 DIAGNOSIS — I5022 Chronic systolic (congestive) heart failure: Secondary | ICD-10-CM | POA: Diagnosis not present

## 2023-04-09 DIAGNOSIS — E785 Hyperlipidemia, unspecified: Secondary | ICD-10-CM | POA: Diagnosis not present

## 2023-04-09 DIAGNOSIS — K7581 Nonalcoholic steatohepatitis (NASH): Secondary | ICD-10-CM | POA: Diagnosis not present

## 2023-04-10 ENCOUNTER — Telehealth (HOSPITAL_COMMUNITY): Payer: Self-pay | Admitting: *Deleted

## 2023-04-10 DIAGNOSIS — S46101D Unspecified injury of muscle, fascia and tendon of long head of biceps, right arm, subsequent encounter: Secondary | ICD-10-CM | POA: Diagnosis not present

## 2023-04-10 NOTE — Telephone Encounter (Signed)
  [facility]   Pre-operative Risk Assessment   HEARTCARE STAFF-IMPORTANT INSTRUCTIONS 1 Red and Blue Text will auto delete once note is signed or closed. 2 Press F2 to navigate through template.   3 On drop down lists, L click to select >> R click to activate next field 4 Reason for Visit format is IMPORTANT!!  See Directions on No. 2 below. 5 Please review chart to determine if there is already a clearance note open for this procedure!!  DO NOT duplicate if a note already exists!!    :1}      Request for Surgical Clearance    Procedure:   R upper arm AV graft vs AV fistula, insertion of tunneled dialysis catheter  Date of Surgery:  Clearance 04/25/23                                 Surgeon:  Dr Myra Gianotti Surgeon's Group or Practice Name:  [facility] Phone number:  [phone] Fax number:  [phone]   Type of Clearance Requested:   - Medical    Type of Anesthesia:  MAC   Additional requests/questions:  Please fax a copy of clearance to the surgeon's office. Ok to continue ASA and hold Brilinta 5 days prior to surgery ?  Routed to Dr Shirlee Latch Meredith Staggers, RN, BSN, [facility] Specialty Coordinator [facility]

## 2023-04-12 DIAGNOSIS — M533 Sacrococcygeal disorders, not elsewhere classified: Secondary | ICD-10-CM | POA: Diagnosis not present

## 2023-04-12 NOTE — Telephone Encounter (Signed)
Per op clearance faxed to Brabham's office on 04/12/23

## 2023-04-12 NOTE — Telephone Encounter (Signed)
Surgery rescheduled to 10/9 due to change in provider schedule. Patient agreeable to date. Reviewed instructions and hold date for Brilinta after 10/3. She voiced understanding.

## 2023-04-15 DIAGNOSIS — S46101D Unspecified injury of muscle, fascia and tendon of long head of biceps, right arm, subsequent encounter: Secondary | ICD-10-CM | POA: Diagnosis not present

## 2023-04-17 DIAGNOSIS — E1022 Type 1 diabetes mellitus with diabetic chronic kidney disease: Secondary | ICD-10-CM | POA: Diagnosis not present

## 2023-04-17 DIAGNOSIS — I509 Heart failure, unspecified: Secondary | ICD-10-CM | POA: Diagnosis not present

## 2023-04-17 DIAGNOSIS — E785 Hyperlipidemia, unspecified: Secondary | ICD-10-CM | POA: Diagnosis not present

## 2023-04-17 DIAGNOSIS — M810 Age-related osteoporosis without current pathological fracture: Secondary | ICD-10-CM | POA: Diagnosis not present

## 2023-04-17 DIAGNOSIS — I132 Hypertensive heart and chronic kidney disease with heart failure and with stage 5 chronic kidney disease, or end stage renal disease: Secondary | ICD-10-CM | POA: Diagnosis not present

## 2023-04-17 DIAGNOSIS — I2510 Atherosclerotic heart disease of native coronary artery without angina pectoris: Secondary | ICD-10-CM | POA: Diagnosis not present

## 2023-04-17 DIAGNOSIS — E1040 Type 1 diabetes mellitus with diabetic neuropathy, unspecified: Secondary | ICD-10-CM | POA: Diagnosis not present

## 2023-04-17 DIAGNOSIS — N185 Chronic kidney disease, stage 5: Secondary | ICD-10-CM | POA: Diagnosis not present

## 2023-04-18 ENCOUNTER — Encounter (HOSPITAL_COMMUNITY): Payer: Self-pay | Admitting: Cardiology

## 2023-04-18 ENCOUNTER — Ambulatory Visit (HOSPITAL_COMMUNITY)
Admission: RE | Admit: 2023-04-18 | Discharge: 2023-04-18 | Disposition: A | Discharge status: Home / Self Care | Payer: Medicare PPO | Source: Ambulatory Visit | Attending: Cardiology | Admitting: Cardiology

## 2023-04-18 VITALS — BP 104/62 | HR 85 | Wt 117.0 lb

## 2023-04-18 DIAGNOSIS — Z8571 Personal history of Hodgkin lymphoma: Secondary | ICD-10-CM | POA: Insufficient documentation

## 2023-04-18 DIAGNOSIS — R2689 Other abnormalities of gait and mobility: Secondary | ICD-10-CM | POA: Insufficient documentation

## 2023-04-18 DIAGNOSIS — Z923 Personal history of irradiation: Secondary | ICD-10-CM | POA: Insufficient documentation

## 2023-04-18 DIAGNOSIS — G250 Essential tremor: Secondary | ICD-10-CM | POA: Diagnosis not present

## 2023-04-18 DIAGNOSIS — I480 Paroxysmal atrial fibrillation: Secondary | ICD-10-CM | POA: Diagnosis not present

## 2023-04-18 DIAGNOSIS — D631 Anemia in chronic kidney disease: Secondary | ICD-10-CM | POA: Diagnosis not present

## 2023-04-18 DIAGNOSIS — D696 Thrombocytopenia, unspecified: Secondary | ICD-10-CM | POA: Diagnosis not present

## 2023-04-18 DIAGNOSIS — Z951 Presence of aortocoronary bypass graft: Secondary | ICD-10-CM | POA: Insufficient documentation

## 2023-04-18 DIAGNOSIS — I495 Sick sinus syndrome: Secondary | ICD-10-CM | POA: Diagnosis not present

## 2023-04-18 DIAGNOSIS — D472 Monoclonal gammopathy: Secondary | ICD-10-CM | POA: Insufficient documentation

## 2023-04-18 DIAGNOSIS — J90 Pleural effusion, not elsewhere classified: Secondary | ICD-10-CM | POA: Insufficient documentation

## 2023-04-18 DIAGNOSIS — I081 Rheumatic disorders of both mitral and tricuspid valves: Secondary | ICD-10-CM | POA: Diagnosis not present

## 2023-04-18 DIAGNOSIS — G4733 Obstructive sleep apnea (adult) (pediatric): Secondary | ICD-10-CM | POA: Diagnosis not present

## 2023-04-18 DIAGNOSIS — Z905 Acquired absence of kidney: Secondary | ICD-10-CM | POA: Insufficient documentation

## 2023-04-18 DIAGNOSIS — I440 Atrioventricular block, first degree: Secondary | ICD-10-CM | POA: Diagnosis not present

## 2023-04-18 DIAGNOSIS — Z953 Presence of xenogenic heart valve: Secondary | ICD-10-CM | POA: Insufficient documentation

## 2023-04-18 DIAGNOSIS — E785 Hyperlipidemia, unspecified: Secondary | ICD-10-CM | POA: Insufficient documentation

## 2023-04-18 DIAGNOSIS — I5032 Chronic diastolic (congestive) heart failure: Secondary | ICD-10-CM | POA: Diagnosis not present

## 2023-04-18 DIAGNOSIS — N184 Chronic kidney disease, stage 4 (severe): Secondary | ICD-10-CM | POA: Diagnosis not present

## 2023-04-18 DIAGNOSIS — Z888 Allergy status to other drugs, medicaments and biological substances status: Secondary | ICD-10-CM | POA: Insufficient documentation

## 2023-04-18 DIAGNOSIS — I130 Hypertensive heart and chronic kidney disease with heart failure and stage 1 through stage 4 chronic kidney disease, or unspecified chronic kidney disease: Secondary | ICD-10-CM | POA: Insufficient documentation

## 2023-04-18 DIAGNOSIS — I5022 Chronic systolic (congestive) heart failure: Secondary | ICD-10-CM | POA: Diagnosis not present

## 2023-04-18 DIAGNOSIS — Z6821 Body mass index (BMI) 21.0-21.9, adult: Secondary | ICD-10-CM | POA: Diagnosis not present

## 2023-04-18 DIAGNOSIS — Z9641 Presence of insulin pump (external) (internal): Secondary | ICD-10-CM | POA: Insufficient documentation

## 2023-04-18 DIAGNOSIS — I25110 Atherosclerotic heart disease of native coronary artery with unstable angina pectoris: Secondary | ICD-10-CM | POA: Insufficient documentation

## 2023-04-18 DIAGNOSIS — I425 Other restrictive cardiomyopathy: Secondary | ICD-10-CM | POA: Insufficient documentation

## 2023-04-18 DIAGNOSIS — Z794 Long term (current) use of insulin: Secondary | ICD-10-CM | POA: Insufficient documentation

## 2023-04-18 DIAGNOSIS — Z79899 Other long term (current) drug therapy: Secondary | ICD-10-CM | POA: Insufficient documentation

## 2023-04-18 DIAGNOSIS — Z91041 Radiographic dye allergy status: Secondary | ICD-10-CM | POA: Insufficient documentation

## 2023-04-18 DIAGNOSIS — E1022 Type 1 diabetes mellitus with diabetic chronic kidney disease: Secondary | ICD-10-CM | POA: Diagnosis not present

## 2023-04-18 DIAGNOSIS — E46 Unspecified protein-calorie malnutrition: Secondary | ICD-10-CM | POA: Diagnosis not present

## 2023-04-18 DIAGNOSIS — Z7902 Long term (current) use of antithrombotics/antiplatelets: Secondary | ICD-10-CM | POA: Insufficient documentation

## 2023-04-18 DIAGNOSIS — Z955 Presence of coronary angioplasty implant and graft: Secondary | ICD-10-CM | POA: Insufficient documentation

## 2023-04-18 LAB — BASIC METABOLIC PANEL
Anion gap: 12 (ref 5–15)
BUN: 80 mg/dL — ABNORMAL HIGH (ref 8–23)
CO2: 25 mmol/L (ref 22–32)
Calcium: 10.3 mg/dL (ref 8.9–10.3)
Chloride: 97 mmol/L — ABNORMAL LOW (ref 98–111)
Creatinine, Ser: 2.56 mg/dL — ABNORMAL HIGH (ref 0.44–1.00)
GFR, Estimated: 20 mL/min — ABNORMAL LOW (ref >60)
Glucose, Bld: 323 mg/dL — ABNORMAL HIGH (ref 70–99)
Potassium: 4.5 mmol/L (ref 3.5–5.1)
Sodium: 134 mmol/L — ABNORMAL LOW (ref 135–145)

## 2023-04-18 LAB — BRAIN NATRIURETIC PEPTIDE: B Natriuretic Peptide: 251.2 pg/mL — ABNORMAL HIGH (ref 0.0–100.0)

## 2023-04-18 NOTE — Patient Instructions (Signed)
Medication Changes:  STOP METOPROLOL SUCCINATE   Lab Work:  Labs done today, your results will be available in MyChart, we will contact you for abnormal readings.  Testing/Procedures:  Your physician has requested that you have an echocardiogram IN 3 MONTHS. Echocardiography is a painless test that uses sound waves to create images of your heart. It provides your doctor with information about the size and shape of your heart and how well your heart's chambers and valves are working. This procedure takes approximately one hour. There are no restrictions for this procedure. Please do NOT wear cologne, perfume, aftershave, or lotions (deodorant is allowed). Please arrive 15 minutes prior to your appointment time.  Follow-Up in: 3 MONTHS WITH DR. Shirlee Latch AS SCHEDULED   At the [facility], you and your health needs are our priority. We have a designated team specialized in the treatment of Heart Failure. This Care Team includes your primary Heart Failure Specialized Cardiologist (physician), Advanced Practice Providers (APPs- Physician Assistants and Nurse Practitioners), and Pharmacist who all work together to provide you with the care you need, when you need it.   You may see any of the following providers on your designated Care Team at your next follow up:  Dr. Arvilla Meres Dr. Marca Ancona Dr. Marcos Eke, NP Robbie Lis, [place] [facility] [place], [place] Brynda Peon, NP Karle Plumber, PharmD   Please be sure to bring in all your medications bottles to every appointment.   Need to Contact us:  If you have any questions or concerns before your next appointment please send [name] a message through [place] or call our office at [phone].    TO LEAVE A MESSAGE FOR THE NURSE SELECT OPTION 2, PLEASE LEAVE A MESSAGE INCLUDING: YOUR NAME DATE OF BIRTH CALL BACK NUMBER REASON FOR CALL**this is important as we prioritize the call  backs  YOU WILL RECEIVE A CALL BACK THE SAME DAY AS LONG AS YOU CALL BEFORE 4:00 PM

## 2023-04-18 NOTE — Progress Notes (Signed)
Date:  04/18/2023   ID:  [name], [name] [name] 12, 1952, MRN [id number]   Provider location: [address], [place] Type of Visit: Established patient  PCP:  Aliene Beams, MD  Cardiologist:  None Primary HF: Dr. Shirlee Latch   History of Present Illness: [name] [name] is a 72 y.o. female who has a history of CAD s/p CABG in 12/05 and redo in 1/11, bioprosthetic AVR, diastolic CHF, and sick sinus syndrome s/p PPM presents for cardiology followup.  She had initial CABG with SVG-RCA in 12/05 followed by redo SVG-RCA and bioprosthetic AVR in 1/11.  She has had diastolic CHF and is on Lasix.  In 9/14, she had a CPX test.  This showed severe functional limitation with ischemic ECG changes (inferolateral ST depression and ST elevation in V1 and V2). She had chest pain.  She had R/LHC in 9/14.  This showed elevated left and right heart filling pressures, patent SVG-PDA, and 80% distal LM/80% ostial LAD stenosis.  She was not a candidate for redo CABG (had 2 prior sternotomies as well as chest radiation for Hodgkins lymphoma).  She had DES from left main into proximal LAD and will need long-term DAPT => Brilinta was used as she has been a poor responder to Plavix in the past.  She re-developed angina in 11/15 and was admitted with unstable angina.  LHC showed 80% ostial LM and patent SVG-RCA. She had DES to ostial LM. Echo 1/18 showed EF 60-65%, normal bioprosthetic aortic valve, mild-moderate MR, PASP 35 mmHg.    She has chronic iron deficiency anemia thought to be due to a slow GI bleed.  Last EGD was unremarkable and a capsule endoscopy was also unremarkable.  She has had periodic transfusions.     She had bilateral mastectomy for DCIS in 10/16 without complication.   She has an insulin pump followed by an endocrinologist at [facility].    Given myalgias with statins, she was started on Praluent.  LDL is now excellent.     In the past she has had short runs of atrial fibrillation.  She is  not anticoagulated. Planned for Watchman placement, but given her nickel allergy, we were unable to place Watchman.    Diagnosed with OSA, now on CPAP.    Given ongoing problems with exertional dyspnea and occasional chest pain, Cardiolite was repeated in 9/18 and showed inferior changes.  She underwent left/right heart cath in 9/18.  This showed elevated filling pressures, R>L.  There was 75+% ostial LCx stenosis.  However, there were 2 layers of stent from the mid left main into the ostial LAD, and LCx intervention was thought to be very risky.  It was decided to manage her medically.  Echo was repeated in 10/18 showing EF 55-60%, moderate to severe TR.     Given recurrent chest pain, she had ETT-Cardiolite done in 3/19.  This was a normal study with no evidence for ischemia or infarction.  Echo in 1/20 showed EF 55-60%, mild LVH, stable bioprosthetic aortic valve, moderate-severe TR.    Due to increased dyspnea and worsening renal function, she was set up for echo and RHC.  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  RHC in 8/20 showed primarily right-sided HF.  Cardiolite in 8/20 showed no ischemia or infarction.  Torsemide was increased and she cut back on sodium in her diet.    CPX in 11/20 showed severe functional limitation from HF.   She saw Dr. Edwena Blow at [facility] for evaluation for heart/kidney transplant (for restrictive cardiomyopathy).  She was determined to be a poor surgical candidate due to extensive vascular calcifications.   CT chest in 8/21 showed a loculated right pleural effusion but unable to find enough fluid by ultrasound for thoracentesis.   Echo in 9/21 showed EF 60-65% with mild LVH, mild RV dilation with mildly decreased systolic function and a  D-shaped septum, PASP 40, normal bioprosthetic aortic valve, rheumatic-appearing MV with mild-moderate mitral stenosis (mean gradient 9 but MVA by PHT 2.7 cm^2), trivial MR.    Echo 2/23, EF 60-65%, mild LVH, normal RV systolic function but with D-shaped septum, normal bioprosthetic aortic valve, mild mitral stenosis (rheumatic-appearing valve, mean gradient 4 mmHg), mild MR.   Seen by hematology 10/23. Labs showed mild hypercalcemia. Calcium level 10.4 with albumin 4.1. SPEP showed M spike.  Kappa/lambda light chain ratio 2.17. Immunofixation showed polyclonal gammopathy. Plan is to return in 6 months to repeat myeloma labs. She has not had a cMRI due to PPM.   Echo at [facility] in 12/23 showed LVEF 55%, AoV bioprosthesis functioning normal. RV systolic function reported to be normal. Moderate TR noted. No mention of mitral stenosis.   Today she returns for followup of CHF.  She is easily fatigued, feels sleepy during the day, has poor balance and tremors.  There is concern that these could be uremic symptoms, and she is planned for surgery soon to place an AV graft. She is short of breath with stairs and inclines.  She can walk short distances on flat ground without problems. She will get occasional exertional chest tightness.  This will come with heavier exertion such as trying to put up decorations in boxes or walking up a steep hill (about once a month).  She will take a NTG with these episodes. No lightheadedness or syncope.   ECG (personally reviewed): NSR, long 1st degree AVB 302 msec, right axis deviation, inferior TWIs  MDT PPM interrogation: 1% a-pacing, 0.3% v-pacing   Labs @ [facility] (3/19): K 4.7, creatinine 1.4, ANA negative, RF negative Labs (3/20): K 4, creatinine 1.8 Labs (6/20): K 4.7, creatinine 1.47 Labs (8/20): creatinine 2.13 => 2.46 => 2.4, LDL 22 Labs (9/20): K 4.8, creatinine 2.74, AST/ALT normal, hgb 12.6 Labs (1/21): K 4.1, creatinine 1.86, hgb 11.7 Labs (4/21): K 4.7,  creatinine 1.7 Labs (6/21): K 4.2, creatinine 2 => 1.7 Labs (8/21): K 3.8, creatinine 1.69, hgb 10.6, plts 116 Labs (9/21): LDL 34, TGs 209 Labs (1/22): K 3.8, creatinine 2.0 Labs (3/22): LDL 25 Labs (4/22): K 4.7, creatinine 2.0, hgb 12.3 Labs (5/22): K 4.4, creatinine 2.21 Labs (8/22): LDL 1, TGs 330 Labs (10/22): K 4.6, creatinine 1.87 Labs (11/22): LDL 26 Labs (12/22): K 4.1, creatinine 2.25 Labs (1/23): creatinine 2.65 Labs (3/23): K 4.6, creatinine 2.8 Labs (4/23): K 4.1, creatinine 2.16, BNP 94 Labs (10/23): K 4.2, creatinine 1.62, calcium 10.4  Labs (12/23): K 4.1, creatinine 2.23 Labs (4/24): K 4.6, SCr 2.6, hgb 12.5 Labs (5/24): K 4.4, creatinine 2.85 Labs (6/24): LDL 24, BNP 118 Labs (9/24): K 4.7, creatinine 2.5   PMH: 1. Sick sinus syndrome s/p [employer] PCM.  2. HTN 3. H/o SBO 4. Renal cell carcinoma s/p right nephrectomy in 2008.  5. 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  Fibroscan in 11/14 showed  advanced fibrosis concerning for cirrhosis. EGD in 11/14 showed no varices.  6. Diastolic CHF: Echo (4/14) with EF 60-65%, bioprosthetic aortic valve with mean gradient 15 mmHg, mild MR.  CPX (9/14): peak VO2 10.4, VE/VCO2 43.8, inferolateral ST depression and ST elevation in V1/V2 with severe dyspnea and chest pain, severe functional limitation with ischemic changes.  Echo (9/14) with EF 60-65%, normal RV size and systolic function, mild-moderate MR, bioprosthetic aortic valve normal, PA systolic pressure 51 mmHg, no evidence for pericardial constriction though exam incomplete for constriction.  Echo (11/15) with EF 55-60%, bioprosthetic aortic valve, mild-moderate MR, moderate TR, PA systolic pressure 42 mmHg.  - TEE (5/17) with EF 60-65%, normal bioprosthetic aortic valve with mean gradient 13 mmHg, normal RV size and systolic function. - Echo (1/18) with EF 60-65%, normal bioprosthetic aortic valve, mild-moderate MR, PASP 35 mmHg.  - Echo (10/18) with EF 55-60%,  bioprosthetic aortic valve mean gradient 10 mmHg, MAC with mild mitral stenosis (mean gradient 7) and mild mitral regurgitation, moderate to severe TR, PASP 43 mmHg.  - RHC (9/18): mean RA 13, PA 44/18 mean 33, mean PCWP 19, CI 3.45, PVR 2.7 WU.  - Echo (1/20): EF 55-60%, mild LVH, mild MR, stable bioprosthetic aortic valve, moderate-severe TR.  - RHC (8/20): mean RA 14, PA 43/15 mean 28, mean PCWP 13, PAPI 2, PVR 4.2, CI 2.31 - Echo (8/20): EF 55-60%, RV mildly dilated with normal systolic function, mildly D-shaped septum suggestive of RV pressure/volume overload, rheumatic-appearing mitral valve with mild mitral stenosis (mean gradient 6, MVA by PHT 4 cm^2), no evidence for pericardial constriction, moderate TR, bioprosthetic aortic valve functioning normally with mean gradient 6 mmHg.  - CPX (11/20): peak VO2 9.8, VE/VCO2 slope 38, RER 1.11 => severe functional limitation from HF.   - Echo (9/21): EF 60-65% with mild LVH, mild RV dilation with mildly decreased systolic function and a D-shaped septum, PASP 40, normal bioprosthetic aortic valve, rheumatic-appearing MV with mild-moderate mitral stenosis (mean gradient 9 but MVA by PHT 2.7 cm^2, trivial MR.  - Echo (2/23): EF 60-65%, mild LVH, normal RV systolic function but with D-shaped septum, normal bioprosthetic aortic valve, mild mitral stenosis (rheumatic-appearing valve, mean gradient 4 mmHg), mild MR.  - RHC 1/24: Elevated right heart filling pressures out of proportion to PCWP suggesting primarily RV failure. Mild PAH. Restrictive filling picture. Low cardiac index at 1.71. (RA 13, PA 43/15, mean 25, PCWP 12 with v waves to 20, CO 2.54, CI 1.71) - Echo (12/23, [facility]): LVEF 55%, AoV bioprosthesis functioning normal. RV systolic function reported to be normal. Moderate TR noted. No mention of mitral stenosis.  7. TAH-BSO 8. Type I diabetes: Has insulin pump.  9. H/o ischemic colitis. 10. H/o PUD. 11. Diabetic gastroparesis. 12. Hyperlipidemia:  Myalgias with Crestor and atorvastatin, elevated LFTs with simvastatin.  Myalgias with > 10 mg pravastatin. Now on Praluent.  13. CKD stage IV: Sees Dr. Signe Colt 14. Bicuspid aortic valve s/p 19 mm Edwards pericardial valve in 1/11 (had post-operative Dresslers syndrome).   15. CAD: CABG with SVG-RCA in 12/05.  Redo SVG-RCA in 1/11 with AVR.  LHC/RHC (9/14) with mean RA 18, PA 62/25 mean 43, mean PCWP 26, CI 2.8, 70-80% distal LM stenosis, 80% ostial LAD stenosis, total occlusion RCA, SVG-PDA patent.  Patient had DES LM into LAD.  Plan to continue long-term ASA/Brilinta (poor responder to Plavix).  ETT-cardiolite (4/15) with 6' exercise, EF 60%, ST depression in recovery and mild chest pain, no ischemia or infarction  by perfusion images.  Unstable angina 11/15 with 80% LM, patent SVG-RCA => DES to ostial LM.   - Lexiscan Cardiolite (2/18): EF 62%, no ischemia or infarction (normal).  She had a significant reaction to [place] and probably should not get again.  - Cardiolite (9/18): EF 72%, prior inferior infarct with mild peri-infarct ischemia.  - LHC (9/18): 2 layers stent left main - ostial LAD with 50% dLM in-stent restenosis, 75+% ostial LCx, totally occluded RCA, SVG-RCA patent.  Medical management planned.  - ETT-Cardiolite (4/19): EF normal, 7'11" exercise, no ischemia or infarction.  - Lexiscan Cardiolite (8/20): No ischemia/infarction.  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  17. Anemia: Iron-deficiency.  Suspect chronic GI blood loss.  EGD in 9/14 was unremarkable.  Capsule endoscopy 10/14 was unremarkable.  Has required periodic blood transfusions.  18. Atrial fibrillation: Paroxysmal, noted by [facility] interrogation.  - Zio monitor 3/23: < 1% atrial fibrillation, longest run was 18 minutes.  19. Carotid stenosis: Carotid dopplers (2/15) with 40-59% bilateral stenosis. Carotid dopplers (3/16) with 40-59% bilateral ICA stenosis. Carotid dopplers (3/17) with  40-59% bilateral ICA stenosis.  - Carotid dopplers (3/18) with mild BICA stenosis.  20. 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  22. C difficile colitis 2017 23. Gout 24. ?TIA: head CT negative.  25. OSA: Moderate, uses CPAP.  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 Hypercalcemia 28. 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  Mild-moderate on 9/21 echo.  Mild on 2/23 echo.  29. Tricuspid regurgitation: Moderate by 8/20 echo.  30. Lung nodules: Followed at [facility] => stable.  31. Essential tremor  Current Outpatient Medications  Medication Sig Dispense Refill   acyclovir ointment (ZOVIRAX) 5 % Apply 1 application topically every 3 (three) hours as needed (fever blister/cold sores).     ALPRAZolam (XANAX) 0.5 MG tablet Take 1 tablet (0.5 mg total) by mouth 2 (two) times daily as needed for anxiety. (Patient taking differently: Take 0.25 mg by mouth at bedtime as needed for anxiety.) 60 tablet 1   APIDRA 100 UNIT/ML injection Inject 40 Units into the skin as directed. INFUSE THROUGH INSULIN PUMP UTD, Bolus depends on carb intake  2   aspirin EC 81 MG tablet Take 81 mg by mouth at bedtime.     BRILINTA 60 MG TABS tablet TAKE ONE TABLET (60MG  TOTAL) BY MOUTH TWICE DAILY 180 tablet 3   cetirizine (ZYRTEC) 10 MG tablet Take 10 mg by mouth in the morning.     clobetasol (OLUX) 0.05 % topical foam Apply 1 application  topically 2 (two) times daily as needed (skin flaking (legs)).     Coenzyme Q10 (CO Q 10) 100 MG CAPS Take 100 mg by mouth at bedtime.      colchicine 0.6 MG tablet Take 0.6 mg by mouth daily as needed (gout flare).     denosumab (PROLIA) 60 MG/ML SOSY injection Inject 60 mg into the skin every 6 (six) months.     diclofenac Sodium (VOLTAREN) 1 % GEL Apply 2 g topically 4 (four) times daily as needed (pain.).     escitalopram (LEXAPRO) 5 MG tablet  Take 5 mg by mouth at bedtime.     famotidine (PEPCID) 40 MG tablet Take 40 mg by mouth at bedtime. Takes a second tablet at night if needed.     gabapentin (NEURONTIN) 100 MG capsule Take 100 mg  by mouth daily.     glucagon (GLUCAGON EMERGENCY) 1 MG injection Inject 1 mg into the vein once as needed (low blood sugar).     isosorbide mononitrate (IMDUR) 120 MG 24 hr tablet TAKE ONE TABLET (120MG  TOTAL) BY MOUTH DAILY 90 tablet 3   latanoprost (XALATAN) 0.005 % ophthalmic solution Place 1 drop into both eyes at bedtime.     metolazone (ZAROXOLYN) 2.5 MG tablet Take 2.5 mg (1 tab) once as directed by CHF clinic. Call before taking [phone] (Patient taking differently: Take 2.5 mg (1 tab) once as directed by CHF clinic. Call before taking [phone].) 3 tablet 0   mometasone (NASONEX) 50 MCG/ACT nasal spray Place 1 spray into the nose at bedtime.     mupirocin cream (BACTROBAN) 2 % Apply 1 application topically 2 (two) times daily as needed. 90 g 0   nitroGLYCERIN (NITROLINGUAL) 0.4 MG/SPRAY spray PLACE 1 SPRAY UNDER TH TONGUE EVERY 5 MINUTES FOR 3 DOSES AS NEEDED FOR CHEST PAIN 4.9 g 5   polyethylene glycol (MIRALAX / GLYCOLAX) 17 g packet Take 17 g by mouth in the morning. Hold for loose stools     pravastatin (PRAVACHOL) 10 MG tablet TAKE ONE TABLET (10 MG TOTAL) BY MOUTH DAILY. (Patient taking differently: Take 10 mg by mouth every evening.) 90 tablet 3   Probiotic Product (ALIGN PO) Take 1 tablet by mouth daily in the afternoon.     REPATHA SURECLICK 140 MG/ML SOAJ INJECT 140MG  INTO THE SKIN EVERY 14 DAYS 6 mL 3   spironolactone (ALDACTONE) 25 MG tablet TAKE ONE-HALF TABLET (12.5MG  TOTAL) BY MOUTH DAILY 45 tablet 0   torsemide (DEMADEX) 20 MG tablet Take 2 tablets (40 mg total) by mouth as directed. 20 mg alternating with 40 mg daily 42 tablet 11   ULORIC 40 MG tablet Take 1 tablet (40 mg total) by mouth at bedtime. 90 tablet 3   VASCEPA 1 g capsule TAKE TWO CAPSULES (2 GRAMS TOTAL) BY  MOUTH TWO TIMES DAILY 360 capsule 3   No current facility-administered medications for this encounter.    Allergies:   Avandia [rosiglitazone maleate], Cephalexin, Clindamycin, Humalog [insulin lispro], Lincomycin, Metformin, Novolog [insulin aspart], Penicillins, Tizanidine, Adhesive [tape], Atorvastatin, Cholestyramine, Gentamycin [gentamicin], Iodinated contrast media, Limonene, Prednisone, Ranexa [ranolazine], Rosuvastatin, Simvastatin, Azelastine, Clopidogrel, Kerendia [finerenone], Lisinopril, Codeine, Erythromycin, Hydrocodone-acetaminophen, Latex, Neomycin, Nickel, Ranitidine, Tamiflu [oseltamivir], and Tramadol   Social History:  The patient  reports that she has never smoked. She has never used smokeless tobacco. She reports that she does not drink alcohol and does not use drugs.   Family History:  The patient's family history includes Anal fissures in her mother; Breast cancer (age of onset: 54) in her sister; Cancer (age of onset: 57) in her sister; Cancer (age of onset: 35) in her maternal grandfather; Diabetes in her father; Glaucoma in her brother; Heart attack in her father, maternal grandmother, and mother; Heart disease in her father and mother; Hypertension in her brother; Lung cancer in her sister; Parkinson's disease in her paternal aunt.   ROS:  Please see the history of present illness.   All other systems are personally reviewed and negative.   Exam:   BP 104/62   Pulse 85   Wt 53.1 kg (117 lb)   SpO2 95%   BMI 21.40 kg/m   PHYSICAL EXAM: General: NAD Neck: No JVD, no thyromegaly or thyroid nodule.  Lungs: Clear to auscultation bilaterally with normal respiratory effort. CV: Nondisplaced PMI.  Heart regular S1/S2,  no S3/S4, 2/6 SEM RUSB with clear S2.  No peripheral edema.  No carotid bruit.  Normal pedal pulses.  Abdomen: Soft, nontender, no hepatosplenomegaly, mild distention.  Skin: Intact without lesions or rashes.  Neurologic: Alert and oriented x 3.  Psych:  Normal affect. Extremities: No clubbing or cyanosis.  HEENT: Normal.   Wt Readings from Last 3 Encounters:  04/18/23 53.1 kg (117 lb)  03/18/23 52.2 kg (115 lb)  02/08/23 54 kg (119 lb)   ASSESSMENT AND PLAN: 1. Chronic diastolic CHF: Restrictive cardiomyopathy.  Most recent echo in 2/23 shows normal LV systolic function, normal RV systolic function but D-shaped septum suggesting RV pressure/volume overload, mild MS, normal bioprosthetic AoV, no evidence for pericardial constriction. RHC in 8/20 was concerning for right > left heart failure, she did appear to have equalization of diastolic pressures concerning for restrictive cardiomyopathy, possibly related to prior radiation. CPX in 11/20 showed severe HF limitation.  She was evaluated at [facility] for heart/kidney transplant for CKD and restrictive cardiomyopathy, but extensive vascular calcification makes her a poor surgical candidate. Balance of renal failure and RV failure has been difficult. RHC 1/24 showed elevated right heart filling pressures out of proportion to PCWP suggesting primarily RV failure. Mild PAH. Restrictive filling picture. Low cardiac index at 1.71 (RA 13, PA 43/15, mean 25, PCWP 12 with v waves to 20, CO 2.54, CI 1.71). Echo done at [facility] 12/23 showed LVEF 55%, AoV bioprosthesis functioning normal. RV systolic function reported to be normal. Moderate TR noted, no mention of mitral stenosis. Chronic NYHA Class III symptoms.  Renal function slowly worsening. Despite her symptoms, she does not appear significantly volume overloaded on exam today.   - Continue Torsemide 40 mg daily alternating with 20 mg daily. BMET today.  - Continue spironolactone 12.5 mg daily. Take qhs to limit BP drop during the day.   - No Jardiance/Farxiga given use of insulin pump.   - I will arrange for echo.  2. CAD: s/p SVG-RCA in 12/05, then redo SVG-RCA in 1/11--> distal left main/proximal LAD severe stenosis in 9/14.  PCI with DES to distal LM/proximal  LAD was done rather than bypass as she would have been a poor candidate for redo sternotomy (2 prior sternotomies and history of chest wall radiation for Hodgkins disease).  Recurrent unstable angina with 80% ostial LM treated with repeat DES in 11/15.  Lexiscan Cardiolite in 9/18 inferior changes.  Repeat LHC (9/18) showed 50% distal left main in-stent restenosis and 75+% ostial LCx stenosis. I reviewed the films at the time with interventional cardiology => would be very difficult to intervene on the ostial LCx through 2 overlapping stents from left main into the LAD.  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 Given no ACS, we planned medical management and increased Imdur.  Cardiolite in 3/19 and again in 8/20 showed no ischemia or infarction.  She has stable angina with heavier exertion, takes a NTG about once a month.  - Continue Brilinta 60 mg bid (poor responder to Plavix) long-term given recurrent left main disease.  - Continue ASA 81, pravastin/Repatha/Vascepa, good lipids in 6/24.  - Continue Imdur 120 mg daily.  - Very long PR interval.  I am going to have her stop Toprol XL to avoid increased RV pacing load.   - She was unable to tolerate ranolazine.  3. Bioprosthetic aortic valve replacement: In setting of bicuspid aortic valve, thoracic aorta not dilated on 2017 TEE.  Valve looked  ok on 2/23 echo.  Stable on Echo done at [facility] 12/23. - I will arrange for repeat echo.  4. CKD:  Patient has a single kidney s/p right nephrectomy and suspected diabetic nephropathy.  CKD stage 4, slowly progressive. I have been concerned for cardiorenal syndrome in the setting of RV > LV failure/restrictive cardiomyopathy.  She has had a tremor as well as worsening fatigue and sleepiness.  I worry that her worsening symptoms are related to uremia. She follows closely with nephrology and will have AV graft placed soon with the thought that she will have to start HD soon.  - She can hold  ticagrelor for AV graft placement.    - Not candidate for PD given prior abdominal surgery.  - As above, suspect she will need HD soon.  Would like to get her to home HD.    - BMET today.  5. Hyperlipidemia: She has not been able to tolerate any higher potency statin than pravastatin 10 mg daily.   - Continue pravastatin.  - Continue Repatha.   - She is on Vascepa for elevated TGs.  6. Anemia: Chronic/slow GI bleed.  Unremarkable EGD and capsule endoscopy in fall 2014.  Unfortunately, needs to stay on Brilinta long-term (Plavix non-responder).   7. Atrial fibrillation: Paroxysmal, very brief episodes noted on PCM interrogation. Given GI bleeding and need to be on Brilinta, risks have been thought to outweigh benefits of anticoagulation. She is not a good Multaq candidate with CHF.  If she needs an antiarrhythmic, consider Tikosyn or Sotalol but renal function may make these meds difficult to use.  Zio monitor in 3/23 showed < 1% AF, longest run 18 minutes.  - Stop Toprol XL as above with very long 1st degree AVB.  - We were unable to place Watchman device due to nickel allergy.   8. Carotid bruit: Only mild stenosis on last dopplers in 3/18.  9. SSS: Pacemaker in place.  10. HTN: BP controlled.    11. OSA: Continue ASV.  12. ?TIA: Transient dysarthria in 3/18, could have been afib-related TIA.  No recurrent symptoms.  13. Thrombocytopenia: Chronic and mild. Follows with hematology.  14. 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  Mild on 2/23 echo.  She would not be a candidate for valve replacement.  - Repeat echo as above.  15. Hematology: MGUS noted, has seen hematology.  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  EGD 07/26/22 negative for varices  Follow-up 3 months with echo.   Signed, Marca Ancona, MD  04/18/2023  [facility] [address] [facility] [place] [address] [phone]  (office) [phone] (fax)

## 2023-04-19 NOTE — Progress Notes (Signed)
Remote pacemaker transmission.   

## 2023-04-22 DIAGNOSIS — S46101D Unspecified injury of muscle, fascia and tendon of long head of biceps, right arm, subsequent encounter: Secondary | ICD-10-CM | POA: Diagnosis not present

## 2023-04-23 ENCOUNTER — Other Ambulatory Visit (HOSPITAL_COMMUNITY): Payer: Self-pay | Admitting: Cardiology

## 2023-04-24 DIAGNOSIS — E1021 Type 1 diabetes mellitus with diabetic nephropathy: Secondary | ICD-10-CM | POA: Diagnosis not present

## 2023-04-24 DIAGNOSIS — S46101D Unspecified injury of muscle, fascia and tendon of long head of biceps, right arm, subsequent encounter: Secondary | ICD-10-CM | POA: Diagnosis not present

## 2023-04-26 DIAGNOSIS — E1021 Type 1 diabetes mellitus with diabetic nephropathy: Secondary | ICD-10-CM | POA: Diagnosis not present

## 2023-04-30 ENCOUNTER — Other Ambulatory Visit: Payer: Self-pay

## 2023-04-30 ENCOUNTER — Encounter (HOSPITAL_COMMUNITY): Payer: Self-pay | Admitting: Surgery

## 2023-04-30 DIAGNOSIS — G4731 Primary central sleep apnea: Secondary | ICD-10-CM | POA: Diagnosis not present

## 2023-04-30 NOTE — Anesthesia Preprocedure Evaluation (Addendum)
Anesthesia Evaluation  Patient identified by MRN, date of birth, ID band Patient awake    Reviewed: Allergy & Precautions, NPO status , Patient's Chart, lab work & pertinent test results  History of Anesthesia Complications (+) PONV and history of anesthetic complications  Airway Mallampati: II  TM Distance: >3 FB Neck ROM: Full    Dental no notable dental hx.    Pulmonary sleep apnea    Pulmonary exam normal        Cardiovascular hypertension, Pt. on medications + CAD, + CABG and +CHF  + pacemaker  Rhythm:Regular Rate:Normal     Neuro/Psych  Headaches  Anxiety        GI/Hepatic PUD,GERD  ,,  Endo/Other  diabetes    Renal/GU CRF and DialysisRenal disease     Musculoskeletal  (+) Arthritis , Osteoarthritis,    Abdominal Normal abdominal exam  (+)   Peds  Hematology  (+) Blood dyscrasia, anemia Lab Results      Component                Value               Date                      WBC                      8.0                 11/12/2022                HGB                      12.5                11/12/2022                HCT                      38.5                11/12/2022                MCV                      92.5                11/12/2022                PLT                      120 (L)             11/12/2022             Lab Results      Component                Value               Date                      NA                       134 (L)             04/18/2023  K                        4.5                 04/18/2023                CO2                      25                  04/18/2023                GLUCOSE                  323 (H)             04/18/2023                BUN                      80 (H)              04/18/2023                CREATININE               2.56 (H)            04/18/2023                CALCIUM                  10.3                04/18/2023                GFR                       70.12               04/13/2015                EGFR                     19.0                01/04/2023                GFRNONAA                 20 (L)              04/18/2023              Anesthesia Other Findings   Reproductive/Obstetrics                             Anesthesia Physical Anesthesia Plan  ASA: 3  Anesthesia Plan: General   Post-op Pain Management: Tylenol PO (pre-op)*   Induction: Intravenous  PONV Risk Score and Plan: 3 and Ondansetron, Dexamethasone and Treatment may vary due to age or medical condition  Airway Management Planned: Mask and LMA  Additional Equipment: None  Intra-op Plan:   Post-operative Plan: Extubation in OR  Informed Consent: I have reviewed the patients History and Physical, chart, labs and discussed the procedure including the risks, benefits and alternatives for the proposed anesthesia with the patient or authorized representative who has indicated his/her understanding and acceptance.  Dental advisory given  Plan Discussed with: CRNA  Anesthesia Plan Comments: (PAT note by Antionette Poles, PA-C: Follows with cardiology for history of CAD s/p CABG in 12/05 and redo in 1/11, bioprosthetic AVR, diastolic CHF, OSA on CPAP, paroxysmal A-fib (short runs, not on anticoagulation), and sick sinus syndrome s/p Medtronic PPM.  Lexiscan Cardiolite 02/2019 showed no ischemia or infarction.  Echo at [facility] in 12/23 showed LVEF 55%, AoV bioprosthesis functioning normal. RV systolic function reported to be normal. Moderate TR noted. No mention of mitral stenosis (mitral stenosis has been mild to moderate on prior echoes).  Last seen by Dr. Shirlee Latch on 04/18/2023.  Pacemaker interrogation showed 1% a pacing, 0.3% V pacing.  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  History of renal cell carcinoma s/p laparoscopic right nephrectomy 2008.  She has chronic iron deficiency anemia thought to be due to a slow GI bleed. Last EGD was unremarkable and  a capsule endoscopy was also unremarkable. She has had periodic transfusions. 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  History of type 1 diabetes, on insulin pump, followed by endocrinology at [facility].  Also has diabetic gastroparesis.  A1c 8.2 on 04/17/2023.  BMP 04/18/2023 reviewed, creatinine elevated at 2.56 consistent with history of CKD, marked hyperglycemia glucose 323.  CBC 04/09/2023 reviewed, mild thrombocytopenia platelets 120k, otherwise unremarkable.  Per surgery posting, patient to hold Brilinta 5 days.  Patient will need to have surgery evaluation.  EKG 04/18/2023: NSR.  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  Right heart cath 08/21/2022: 1. Elevated right heart filling pressures out of proportion to PCWP suggesting primarily RV failure.  2. Mild pulmonary arterial hypertension.  3. Restrictive filling picture.  4. Low cardiac index at 1.71.   I will keep torsemide at current dose, RA pressure not markedly elevated and lowering further in setting of normal PCWP will likely lead to worsening renal function.   TTE 08/31/2021: 1. Left ventricular ejection fraction, by estimation, is 60 to 65%. The  left ventricle has normal function. The left ventricle has no regional  wall motion abnormalities. There is mild left ventricular hypertrophy.  Left ventricular diastolic parameters  are consistent with Grade II diastolic dysfunction (pseudonormalization).  2. Right ventricular systolic function is normal. The right ventricular  size is normal. There is normal pulmonary artery systolic pressure. The  estimated right ventricular systolic pressure is 25.0 mmHg.  3. Left atrial size was mildly dilated.  4. The mitral valve is degenerative. Mild mitral valve regurgitation.  Mild mitral stenosis. The mean mitral valve gradient is 4.0 mmHg.  Moderate  to severe mitral annular calcification.  5. Bioprosthetic aortic valve. No significant stenosis (mean gradient 6)  or regurgitation.  6. The inferior vena cava is normal in size with <50% respiratory  variability, suggesting right atrial pressure of 8 mmHg.    )        Anesthesia Quick Evaluation

## 2023-04-30 NOTE — Progress Notes (Signed)
PCP - General - Family Medicine Aliene Beams, MD  Advanced Heart Failure - Cardiology Laurey Morale, MD Sleep Medicine - Cardiology Quintella Reichert, MD Electrophysiology - Cardiology Marinus Maw, MD   PPM/ICD - Pacemaker Meditronic Device Orders - message sent Rep Notified - emailed rep  Chest x-ray - Chest CT 08-10-22 EKG - 04-18-23 Stress Test - 05-26-19 ECHO - 08-31-21 Cardiac Cath - 08-21-22  CPAP - AVS Machine at [facility]    Fasting Blood Sugar - 70-100 per patient Checks Blood Sugar  Guardian 4 medtronic talks to 780 G pump A1c 8.2 04-17-23   Blood Thinner Instructions: BRILINTA Hold 5 days prior to surgery LAST DOSE Aspirin Instructions: continue  ERAS Protcol - NPO  COVID TEST- n/a  Anesthesia review: yes Cardiac hx, DM, pacemaker  Patient verbally denies any shortness of breath, fever, cough and chest pain during phone call   -------------  SDW INSTRUCTIONS given:  Your procedure is scheduled on May 01, 2023.  Report to [facility] Main Entrance "A" at 6:00 A.M., and check in at the Admitting office.  Call this number if you have problems the morning of surgery:  [phone]   Remember:  Do not eat or drink after midnight the night before your surgery     Take these medicines the morning of surgery with A SIP OF WATER  cetirizine (ZYRTEC)  gabapentin (NEURONTIN)  isosorbide mononitrate (IMDUR)  pravastatin (PRAVACHOL)  VASCEPA   IF NEEDED ALPRAZolam (XANAX)  colchicine  glucagon (GLUCAGON EMERGENCY)  nitroGLYCERIN (NITROLINGUAL)     WHAT DO I DO ABOUT MY DIABETES MEDICATION?   Do not take oral diabetes medicines (pills) the morning of surgery.      APIDRA 100 Inject 40 Units into the skin as directed. INFUSE THROUGH INSULIN PUMP UTD Reduce basal rate by 20%  The day of surgery, do not take other diabetes injectables, including Byetta (exenatide), Bydureon (exenatide ER), Victoza (liraglutide), or Trulicity (dulaglutide).  If your CBG is  greater than 220 mg/dL, you may take  of your sliding scale (correction) dose of insulin.   HOW TO MANAGE YOUR DIABETES BEFORE AND AFTER SURGERY  Why is it important to control my blood sugar before and after surgery? Improving blood sugar levels before and after surgery helps healing and can limit problems. A way of improving blood sugar control is eating a healthy diet by:  Eating less sugar and carbohydrates  Increasing activity/exercise  Talking with your doctor about reaching your blood sugar goals High blood sugars (greater than 180 mg/dL) can raise your risk of infections and slow your recovery, so you will need to focus on controlling your diabetes during the weeks before surgery. Make sure that the doctor who takes care of your diabetes knows about your planned surgery including the date and location.  How do I manage my blood sugar before surgery? Check your blood sugar at least 4 times a day, starting 2 days before surgery, to make sure that the level is not too high or low.  Check your blood sugar the morning of your surgery when you wake up and every 2 hours until you get to the Short Stay unit.  If your blood sugar is less than 70 mg/dL, you will need to treat for low blood sugar: Do not take insulin. Treat a low blood sugar (less than 70 mg/dL) with  cup of clear juice (cranberry or apple), 4 glucose tablets, OR glucose gel. Recheck blood sugar in 15 minutes after treatment (  to make sure it is greater than 70 mg/dL). If your blood sugar is not greater than 70 mg/dL on recheck, call [phone] for further instructions. Report your blood sugar to the short stay nurse when you get to Short Stay.  If you are admitted to the hospital after surgery: Your blood sugar will be checked by the staff and you will probably be given insulin after surgery (instead of oral diabetes medicines) to make sure you have good blood sugar levels. The goal for blood sugar control after surgery  is 80-180 mg/dL.  As of today, STOP taking any Aspirin (unless otherwise instructed by your surgeon) Aleve, Naproxen, Ibuprofen, Motrin, Advil, Goody's, BC's, all herbal medications, fish oil, and all vitamins. THIS INCLUDES YOUR diclofenac Sodium (VOLTAREN).                       Do not wear jewelry, make up, or nail polish            Do not wear lotions, powders, perfumes/colognes, or deodorant.            Do not shave 48 hours prior to surgery.  Men may shave face and neck.            Do not bring valuables to the hospital.            [facility] is not responsible for any belongings or valuables.  Do NOT Smoke (Tobacco/Vaping) 24 hours prior to your procedure If you use a CPAP at night, you may bring all equipment for your overnight stay.   Contacts, glasses, dentures or bridgework may not be worn into surgery.      For patients admitted to the hospital, discharge time will be determined by your treatment team.   Patients discharged the day of surgery will not be allowed to drive home, and someone needs to stay with them for 24 hours.    Special instructions:   [place]- Preparing For Surgery  Before surgery, you can play an important role. Because skin is not sterile, your skin needs to be as free of germs as possible. You can reduce the number of germs on your skin by washing with CHG (chlorahexidine gluconate) Soap before surgery.  CHG is an antiseptic cleaner which kills germs and bonds with the skin to continue killing germs even after washing.    Oral Hygiene is also important to reduce your risk of infection.  Remember - BRUSH YOUR TEETH THE MORNING OF SURGERY WITH YOUR REGULAR TOOTHPASTE  Please do not use if you have an allergy to CHG or antibacterial soaps. If your skin becomes reddened/irritated stop using the CHG.  Do not shave (including legs and underarms) for at least 48 hours prior to first CHG shower. It is OK to shave your face.  Please follow these  instructions carefully.   Shower the NIGHT BEFORE SURGERY and the MORNING OF SURGERY with DIAL Soap.   Pat yourself dry with a CLEAN TOWEL.  Wear CLEAN PAJAMAS to bed the night before surgery  Place CLEAN SHEETS on your bed the night of your first shower and DO NOT SLEEP WITH PETS.   Day of Surgery: Please shower morning of surgery  Wear Clean/Comfortable clothing the morning of surgery Do not apply any deodorants/lotions.   Remember to brush your teeth WITH YOUR REGULAR TOOTHPASTE.   Questions were answered. Patient verbalized understanding of instructions.

## 2023-04-30 NOTE — Progress Notes (Signed)
Anesthesia Chart Review: Same-day workup  Follows with cardiology for history of CAD s/p CABG in 12/05 and redo in 1/11, bioprosthetic AVR, diastolic CHF, OSA on CPAP, paroxysmal A-fib (short runs, not on anticoagulation), and sick sinus syndrome s/p Medtronic PPM.  Lexiscan Cardiolite 02/2019 showed no ischemia or infarction.  Echo at [facility] in 12/23 showed LVEF 55%, AoV bioprosthesis functioning normal. RV systolic function reported to be normal. Moderate TR noted. No mention of mitral stenosis (mitral stenosis has been mild to moderate on prior echoes).  Last seen by Dr. Shirlee Latch on 04/18/2023.  Pacemaker interrogation showed 1% a pacing, 0.3% V pacing.  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  History of renal cell carcinoma s/p laparoscopic right nephrectomy 2008.  She has chronic iron deficiency anemia thought to be due to a slow GI bleed.  Last EGD was unremarkable and a capsule endoscopy was also unremarkable.  She has had periodic transfusions.  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   History of type 1 diabetes, on insulin pump, followed by endocrinology at [facility].  Also has diabetic gastroparesis.  A1c 8.2 on 04/17/2023.  BMP 04/18/2023 reviewed, creatinine elevated at 2.56 consistent with history of CKD, marked hyperglycemia glucose 323.  CBC 04/09/2023 reviewed, mild thrombocytopenia platelets 120k, otherwise unremarkable.  Per surgery posting, patient to hold Brilinta 5 days.  Patient will need to have surgery evaluation.  EKG 04/18/2023: NSR.  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  Right heart cath 08/21/2022: 1. Elevated right heart filling pressures out of proportion to PCWP suggesting primarily RV failure.  2. Mild pulmonary arterial hypertension.  3. Restrictive filling picture.  4. Low cardiac index at 1.71.    I will keep torsemide at current dose, RA  pressure not markedly elevated and lowering further in setting of normal PCWP will likely lead to worsening renal function.   TTE 08/31/2021:  1. Left ventricular ejection fraction, by estimation, is 60 to 65%. The  left ventricle has normal function. The left ventricle has no regional  wall motion abnormalities. There is mild left ventricular hypertrophy.  Left ventricular diastolic parameters  are consistent with Grade II diastolic dysfunction (pseudonormalization).   2. Right ventricular systolic function is normal. The right ventricular  size is normal. There is normal pulmonary artery systolic pressure. The  estimated right ventricular systolic pressure is 25.0 mmHg.   3. Left atrial size was mildly dilated.   4. The mitral valve is degenerative. Mild mitral valve regurgitation.  Mild mitral stenosis. The mean mitral valve gradient is 4.0 mmHg. Moderate  to severe mitral annular calcification.   5. Bioprosthetic aortic valve. No significant stenosis (mean gradient 6)  or regurgitation.   6. The inferior vena cava is normal in size with <50% respiratory  variability, suggesting right atrial pressure of 8 mmHg.    Zannie Cove [facility] Short Stay Center/Anesthesiology Phone [phone] 04/30/2023 10:03 AM

## 2023-05-01 ENCOUNTER — Ambulatory Visit (HOSPITAL_COMMUNITY)
Admission: RE | Admit: 2023-05-01 | Discharge: 2023-05-01 | Disposition: A | Discharge status: Home / Self Care | Payer: Medicare PPO | Attending: Surgery | Admitting: Surgery

## 2023-05-01 ENCOUNTER — Encounter (HOSPITAL_COMMUNITY)
Admission: RE | Disposition: A | Discharge status: Home / Self Care | Payer: Self-pay | Source: Home / Self Care | Attending: Surgery

## 2023-05-01 ENCOUNTER — Ambulatory Visit (HOSPITAL_COMMUNITY): Payer: Medicare PPO

## 2023-05-01 ENCOUNTER — Ambulatory Visit (HOSPITAL_BASED_OUTPATIENT_CLINIC_OR_DEPARTMENT_OTHER): Payer: Self-pay | Admitting: Physician Assistant

## 2023-05-01 ENCOUNTER — Other Ambulatory Visit: Payer: Self-pay

## 2023-05-01 ENCOUNTER — Ambulatory Visit (HOSPITAL_COMMUNITY): Payer: Medicare PPO | Admitting: Physician Assistant

## 2023-05-01 ENCOUNTER — Encounter (HOSPITAL_COMMUNITY): Payer: Self-pay | Admitting: Surgery

## 2023-05-01 ENCOUNTER — Other Ambulatory Visit (HOSPITAL_COMMUNITY): Payer: Self-pay

## 2023-05-01 DIAGNOSIS — E1143 Type 2 diabetes mellitus with diabetic autonomic (poly)neuropathy: Secondary | ICD-10-CM | POA: Insufficient documentation

## 2023-05-01 DIAGNOSIS — N185 Chronic kidney disease, stage 5: Secondary | ICD-10-CM

## 2023-05-01 DIAGNOSIS — E785 Hyperlipidemia, unspecified: Secondary | ICD-10-CM | POA: Insufficient documentation

## 2023-05-01 DIAGNOSIS — K3184 Gastroparesis: Secondary | ICD-10-CM | POA: Diagnosis not present

## 2023-05-01 DIAGNOSIS — I5032 Chronic diastolic (congestive) heart failure: Secondary | ICD-10-CM | POA: Insufficient documentation

## 2023-05-01 DIAGNOSIS — G4733 Obstructive sleep apnea (adult) (pediatric): Secondary | ICD-10-CM | POA: Insufficient documentation

## 2023-05-01 DIAGNOSIS — I129 Hypertensive chronic kidney disease with stage 1 through stage 4 chronic kidney disease, or unspecified chronic kidney disease: Secondary | ICD-10-CM | POA: Diagnosis not present

## 2023-05-01 DIAGNOSIS — Z794 Long term (current) use of insulin: Secondary | ICD-10-CM | POA: Insufficient documentation

## 2023-05-01 DIAGNOSIS — I350 Nonrheumatic aortic (valve) stenosis: Secondary | ICD-10-CM | POA: Insufficient documentation

## 2023-05-01 DIAGNOSIS — K7581 Nonalcoholic steatohepatitis (NASH): Secondary | ICD-10-CM | POA: Diagnosis not present

## 2023-05-01 DIAGNOSIS — Z951 Presence of aortocoronary bypass graft: Secondary | ICD-10-CM | POA: Diagnosis not present

## 2023-05-01 DIAGNOSIS — Z85528 Personal history of other malignant neoplasm of kidney: Secondary | ICD-10-CM | POA: Insufficient documentation

## 2023-05-01 DIAGNOSIS — Z950 Presence of cardiac pacemaker: Secondary | ICD-10-CM | POA: Insufficient documentation

## 2023-05-01 DIAGNOSIS — R0989 Other specified symptoms and signs involving the circulatory and respiratory systems: Secondary | ICD-10-CM | POA: Diagnosis not present

## 2023-05-01 DIAGNOSIS — Z853 Personal history of malignant neoplasm of breast: Secondary | ICD-10-CM | POA: Diagnosis not present

## 2023-05-01 DIAGNOSIS — N184 Chronic kidney disease, stage 4 (severe): Secondary | ICD-10-CM

## 2023-05-01 DIAGNOSIS — Z7951 Long term (current) use of inhaled steroids: Secondary | ICD-10-CM | POA: Insufficient documentation

## 2023-05-01 DIAGNOSIS — Z952 Presence of prosthetic heart valve: Secondary | ICD-10-CM | POA: Insufficient documentation

## 2023-05-01 DIAGNOSIS — Z955 Presence of coronary angioplasty implant and graft: Secondary | ICD-10-CM | POA: Insufficient documentation

## 2023-05-01 DIAGNOSIS — I2510 Atherosclerotic heart disease of native coronary artery without angina pectoris: Secondary | ICD-10-CM | POA: Insufficient documentation

## 2023-05-01 DIAGNOSIS — Z8571 Personal history of Hodgkin lymphoma: Secondary | ICD-10-CM | POA: Insufficient documentation

## 2023-05-01 DIAGNOSIS — Z7902 Long term (current) use of antithrombotics/antiplatelets: Secondary | ICD-10-CM | POA: Insufficient documentation

## 2023-05-01 DIAGNOSIS — I132 Hypertensive heart and chronic kidney disease with heart failure and with stage 5 chronic kidney disease, or end stage renal disease: Secondary | ICD-10-CM | POA: Insufficient documentation

## 2023-05-01 DIAGNOSIS — K7460 Unspecified cirrhosis of liver: Secondary | ICD-10-CM | POA: Insufficient documentation

## 2023-05-01 DIAGNOSIS — Z905 Acquired absence of kidney: Secondary | ICD-10-CM | POA: Diagnosis not present

## 2023-05-01 DIAGNOSIS — E1122 Type 2 diabetes mellitus with diabetic chronic kidney disease: Secondary | ICD-10-CM

## 2023-05-01 DIAGNOSIS — I480 Paroxysmal atrial fibrillation: Secondary | ICD-10-CM | POA: Insufficient documentation

## 2023-05-01 HISTORY — PX: AV FISTULA PLACEMENT: SHX1204

## 2023-05-01 HISTORY — PX: INSERTION OF DIALYSIS CATHETER: SHX1324

## 2023-05-01 HISTORY — DX: Other complications of anesthesia, initial encounter: T88.59XA

## 2023-05-01 HISTORY — DX: Dyspnea, unspecified: R06.00

## 2023-05-01 LAB — POCT I-STAT, CHEM 8
BUN: 77 mg/dL — ABNORMAL HIGH (ref 8–23)
Calcium, Ion: 1.38 mmol/L (ref 1.15–1.40)
Chloride: 100 mmol/L (ref 98–111)
Creatinine, Ser: 2.7 mg/dL — ABNORMAL HIGH (ref 0.44–1.00)
Glucose, Bld: 131 mg/dL — ABNORMAL HIGH (ref 70–99)
HCT: 38 % (ref 36.0–46.0)
Hemoglobin: 12.9 g/dL (ref 12.0–15.0)
Potassium: 4.1 mmol/L (ref 3.5–5.1)
Sodium: 138 mmol/L (ref 135–145)
TCO2: 31 mmol/L (ref 22–32)

## 2023-05-01 LAB — GLUCOSE, CAPILLARY
Glucose-Capillary: 119 mg/dL — ABNORMAL HIGH (ref 70–99)
Glucose-Capillary: 153 mg/dL — ABNORMAL HIGH (ref 70–99)

## 2023-05-01 SURGERY — INSERTION OF ARTERIOVENOUS (AV) GORE-TEX GRAFT ARM
Anesthesia: Monitor Anesthesia Care | Site: Neck | Laterality: Right

## 2023-05-01 MED ORDER — VANCOMYCIN HCL IN DEXTROSE 1-5 GM/200ML-% IV SOLN
1000.0000 mg | INTRAVENOUS | Status: AC
  Administered 2023-05-01: 1000 mg via INTRAVENOUS
  Filled 2023-05-01: qty 200

## 2023-05-01 MED ORDER — LIDOCAINE 2% (20 MG/ML) 5 ML SYRINGE
INTRAMUSCULAR | Status: DC | PRN
  Administered 2023-05-01: 20 mg via INTRAVENOUS

## 2023-05-01 MED ORDER — PHENYLEPHRINE 80 MCG/ML (10ML) SYRINGE FOR IV PUSH (FOR BLOOD PRESSURE SUPPORT)
PREFILLED_SYRINGE | INTRAVENOUS | Status: DC | PRN
  Administered 2023-05-01 (×2): 160 ug via INTRAVENOUS

## 2023-05-01 MED ORDER — 0.9 % SODIUM CHLORIDE (POUR BTL) OPTIME
TOPICAL | Status: DC | PRN
  Administered 2023-05-01: 1000 mL

## 2023-05-01 MED ORDER — PHENYLEPHRINE 80 MCG/ML (10ML) SYRINGE FOR IV PUSH (FOR BLOOD PRESSURE SUPPORT)
PREFILLED_SYRINGE | INTRAVENOUS | Status: AC
  Filled 2023-05-01: qty 10

## 2023-05-01 MED ORDER — FENTANYL CITRATE (PF) 100 MCG/2ML IJ SOLN: 25.0000 ug | INTRAMUSCULAR | Status: DC | PRN

## 2023-05-01 MED ORDER — FENTANYL CITRATE (PF) 250 MCG/5ML IJ SOLN
INTRAMUSCULAR | Status: DC | PRN
  Administered 2023-05-01 (×2): 50 ug via INTRAVENOUS

## 2023-05-01 MED ORDER — PHENYLEPHRINE HCL-NACL 20-0.9 MG/250ML-% IV SOLN
INTRAVENOUS | Status: DC | PRN
  Administered 2023-05-01: 25 ug/min via INTRAVENOUS

## 2023-05-01 MED ORDER — ONDANSETRON HCL 4 MG/2ML IJ SOLN
INTRAMUSCULAR | Status: DC | PRN
  Administered 2023-05-01: 4 mg via INTRAVENOUS

## 2023-05-01 MED ORDER — HEPARIN SODIUM (PORCINE) 1000 UNIT/ML IJ SOLN
INTRAMUSCULAR | Status: DC | PRN
  Administered 2023-05-01: 3800 [IU] via INTRAVENOUS

## 2023-05-01 MED ORDER — ORAL CARE MOUTH RINSE: 15.0000 mL | Freq: Once | OROMUCOSAL | Status: AC

## 2023-05-01 MED ORDER — PROPOFOL 10 MG/ML IV BOLUS
INTRAVENOUS | Status: DC | PRN
  Administered 2023-05-01: 120 mg via INTRAVENOUS

## 2023-05-01 MED ORDER — HEMOSTATIC AGENTS (NO CHARGE) OPTIME
TOPICAL | Status: DC | PRN
Start: 2023-05-01 — End: 2023-05-01
  Administered 2023-05-01: 1 via TOPICAL

## 2023-05-01 MED ORDER — LIDOCAINE 2% (20 MG/ML) 5 ML SYRINGE
INTRAMUSCULAR | Status: AC
  Filled 2023-05-01: qty 5

## 2023-05-01 MED ORDER — SODIUM CHLORIDE 0.9 % IV SOLN: INTRAVENOUS | Status: DC

## 2023-05-01 MED ORDER — OXYCODONE HCL 5 MG PO TABS
5.0000 mg | ORAL_TABLET | Freq: Four times a day (QID) | ORAL | 0 refills | Status: DC | PRN
Start: 2023-05-01 — End: 2023-06-04
  Filled 2023-05-01: qty 7, 2d supply, fill #0

## 2023-05-01 MED ORDER — CHLORHEXIDINE GLUCONATE 0.12 % MT SOLN: 15.0000 mL | Freq: Once | OROMUCOSAL | Status: AC

## 2023-05-01 MED ORDER — PROPOFOL 10 MG/ML IV BOLUS
INTRAVENOUS | Status: AC
  Filled 2023-05-01: qty 20

## 2023-05-01 MED ORDER — HEPARIN 6000 UNIT IRRIGATION SOLUTION
Status: DC | PRN
  Administered 2023-05-01: 1

## 2023-05-01 MED ORDER — ACETAMINOPHEN 500 MG PO TABS
1000.0000 mg | ORAL_TABLET | Freq: Once | ORAL | Status: DC
  Filled 2023-05-01: qty 2

## 2023-05-01 MED ORDER — EPHEDRINE 5 MG/ML INJ
INTRAVENOUS | Status: AC
  Filled 2023-05-01: qty 5

## 2023-05-01 MED ORDER — CHLORHEXIDINE GLUCONATE 4 % EX SOLN: 60.0000 mL | Freq: Once | CUTANEOUS | Status: DC

## 2023-05-01 MED ORDER — HEPARIN 6000 UNIT IRRIGATION SOLUTION
Status: AC
  Filled 2023-05-01: qty 500

## 2023-05-01 MED ORDER — HEPARIN SODIUM (PORCINE) 1000 UNIT/ML IJ SOLN
INTRAMUSCULAR | Status: AC
  Filled 2023-05-01: qty 10

## 2023-05-01 MED ORDER — LIDOCAINE-EPINEPHRINE (PF) 1 %-1:200000 IJ SOLN
INTRAMUSCULAR | Status: AC
  Filled 2023-05-01: qty 30

## 2023-05-01 MED ORDER — CHLORHEXIDINE GLUCONATE 0.12 % MT SOLN
OROMUCOSAL | Status: AC
  Administered 2023-05-01: 15 mL via OROMUCOSAL
  Filled 2023-05-01: qty 15

## 2023-05-01 MED ORDER — BUPIVACAINE HCL (PF) 0.5 % IJ SOLN
INTRAMUSCULAR | Status: AC
  Filled 2023-05-01: qty 30

## 2023-05-01 MED ORDER — BUPIVACAINE LIPOSOME 1.3 % IJ SUSP
INTRAMUSCULAR | Status: AC
  Filled 2023-05-01: qty 20

## 2023-05-01 MED ORDER — FENTANYL CITRATE (PF) 250 MCG/5ML IJ SOLN
INTRAMUSCULAR | Status: AC
  Filled 2023-05-01: qty 5

## 2023-05-01 MED ORDER — DEXAMETHASONE SODIUM PHOSPHATE 10 MG/ML IJ SOLN
INTRAMUSCULAR | Status: AC
  Filled 2023-05-01: qty 1

## 2023-05-01 MED ORDER — ONDANSETRON HCL 4 MG/2ML IJ SOLN
INTRAMUSCULAR | Status: AC
  Filled 2023-05-01: qty 2

## 2023-05-01 SURGICAL SUPPLY — 65 items
ADH SKN CLS APL DERMABOND .7 (GAUZE/BANDAGES/DRESSINGS) ×2
ARMBAND PINK RESTRICT EXTREMIT (MISCELLANEOUS) ×4 IMPLANT
BAG BANDED W/RUBBER/TAPE 36X54 (MISCELLANEOUS) IMPLANT
BAG COUNTER SPONGE SURGICOUNT (BAG) ×2 IMPLANT
BAG DECANTER FOR FLEXI CONT (MISCELLANEOUS) ×2 IMPLANT
BAG EQP BAND 135X91 W/RBR TAPE (MISCELLANEOUS) ×2
BAG SPNG CNTER NS LX DISP (BAG) ×2
BIOPATCH RED 1 DISK 7.0 (GAUZE/BANDAGES/DRESSINGS) ×2 IMPLANT
CANISTER SUCT 3000ML PPV (MISCELLANEOUS) ×2 IMPLANT
CATH PALINDROME-P 19CM W/VT (CATHETERS) IMPLANT
CATH PALINDROME-P 23 W/VT (CATHETERS) IMPLANT
CATH PALINDROME-P 23CM W/VT (CATHETERS) IMPLANT
CATH PALINDROME-P 28CM W/VT (CATHETERS) IMPLANT
CLIP TI MEDIUM 6 (CLIP) ×2 IMPLANT
CLIP TI WIDE RED SMALL 6 (CLIP) ×2 IMPLANT
COVER DOME SNAP 22 D (MISCELLANEOUS) IMPLANT
COVER PROBE W GEL 5X96 (DRAPES) ×2 IMPLANT
COVER SURGICAL LIGHT HANDLE (MISCELLANEOUS) ×2 IMPLANT
DERMABOND ADVANCED .7 DNX12 (GAUZE/BANDAGES/DRESSINGS) ×2 IMPLANT
DRAPE C-ARM 42X72 X-RAY (DRAPES) ×2 IMPLANT
DRAPE CHEST BREAST 15X10 FENES (DRAPES) ×2 IMPLANT
ELECT REM PT RETURN 9FT ADLT (ELECTROSURGICAL) ×2 IMPLANT
ELECTRODE REM PT RTRN 9FT ADLT (ELECTROSURGICAL) ×2 IMPLANT
GAUZE 4X4 16PLY ~~LOC~~+RFID DBL (SPONGE) ×2 IMPLANT
GLOVE BIOGEL PI IND STRL 7.0 (GLOVE) IMPLANT
GLOVE BIOGEL PI IND STRL 7.5 (GLOVE) IMPLANT
GLOVE SKINSENSE STRL SZ7.0 (GLOVE) IMPLANT
GLOVE SURG SS PI 7.0 STRL IVOR (GLOVE) IMPLANT
GLOVE SURG SS PI 7.5 STRL IVOR (GLOVE) ×6 IMPLANT
GOWN STRL REUS W/ TWL LRG LVL3 (GOWN DISPOSABLE) ×4 IMPLANT
GOWN STRL REUS W/ TWL XL LVL3 (GOWN DISPOSABLE) ×2 IMPLANT
GOWN STRL REUS W/TWL LRG LVL3 (GOWN DISPOSABLE) ×4
GOWN STRL REUS W/TWL XL LVL3 (GOWN DISPOSABLE) ×2
HEMOSTAT SNOW SURGICEL 2X4 (HEMOSTASIS) IMPLANT
KIT BASIN OR (CUSTOM PROCEDURE TRAY) ×2 IMPLANT
KIT PALINDROME-P 55CM (CATHETERS) IMPLANT
KIT TURNOVER KIT B (KITS) ×2 IMPLANT
NDL 18GX1X1/2 (RX/OR ONLY) (NEEDLE) ×2 IMPLANT
NDL HYPO 25GX1X1/2 BEV (NEEDLE) ×2 IMPLANT
NEEDLE 18GX1X1/2 (RX/OR ONLY) (NEEDLE) ×2 IMPLANT
NEEDLE HYPO 25GX1X1/2 BEV (NEEDLE) ×2 IMPLANT
NS IRRIG 1000ML POUR BTL (IV SOLUTION) ×2 IMPLANT
PACK BASIC III (CUSTOM PROCEDURE TRAY) ×2
PACK CV ACCESS (CUSTOM PROCEDURE TRAY) ×2 IMPLANT
PACK SRG BSC III STRL LF ECLPS (CUSTOM PROCEDURE TRAY) ×2 IMPLANT
PAD ARMBOARD 7.5X6 YLW CONV (MISCELLANEOUS) ×4 IMPLANT
SET MICROPUNCTURE 5F STIFF (MISCELLANEOUS) IMPLANT
SLING ARM FOAM STRAP LRG (SOFTGOODS) IMPLANT
SLING ARM FOAM STRAP MED (SOFTGOODS) IMPLANT
SOAP 2 % CHG 4 OZ (WOUND CARE) ×2 IMPLANT
SUT ETHILON 3 0 PS 1 (SUTURE) ×2 IMPLANT
SUT PROLENE 6 0 BV (SUTURE) ×4 IMPLANT
SUT VIC AB 3-0 SH 27 (SUTURE) ×4
SUT VIC AB 3-0 SH 27X BRD (SUTURE) ×4 IMPLANT
SUT VICRYL 4-0 PS2 18IN ABS (SUTURE) ×2 IMPLANT
SYR 10ML LL (SYRINGE) ×2 IMPLANT
SYR 20ML LL LF (SYRINGE) ×4 IMPLANT
SYR 30ML LL (SYRINGE) ×2 IMPLANT
SYR 5ML LL (SYRINGE) ×2 IMPLANT
SYR CONTROL 10ML LL (SYRINGE) ×2 IMPLANT
SYR TOOMEY 50ML (SYRINGE) IMPLANT
TOWEL GREEN STERILE (TOWEL DISPOSABLE) ×4 IMPLANT
TOWEL GREEN STERILE FF (TOWEL DISPOSABLE) ×2 IMPLANT
UNDERPAD 30X36 HEAVY ABSORB (UNDERPADS AND DIAPERS) ×2 IMPLANT
WATER STERILE IRR 1000ML POUR (IV SOLUTION) ×2 IMPLANT

## 2023-05-01 NOTE — H&P (Signed)
[facility] [place]   Patient name: [name] [name]      MRN: [id number]        DOB: Dec 07, 1950          Sex: female     REQUESTING PROVIDER:     Dr. Signe Colt     REASON FOR CONSULT:    Dialysis access   HISTORY OF PRESENT ILLNESS:    [name] [name] is a 72 y.o. female, who is referred for dialysis access.  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  She is a diabetic.  She has undergone right radical nephrectomy for renal cell cancer in 2008.  She has a history of CABG x 2 in 2005 and 2011.  She has a left-sided pacemaker.  She is on Brilinta.  She also has a history of lymphoma dating back to 61.  She has NASH with cirrhosis.  She has been declined her renal heart transplant at [facility].  She has a porcelain aorta   Her creatinine has been increasing up into the 3 range.  She will occasionally get dizzy.  She is right-handed   PAST MEDICAL HISTORY          Past Medical History:  Diagnosis Date   Anxiety     Aortic stenosis      a. bicuspid aortic valve; mean gradient of 20 mmHg in 2/10; b. s/p bioprosth AVR (19-mm Edwards pericardial valve) with a redo coronary artery bypass graft procedure in 07/2009; postoperative Dressler's syndrome     Arthritis     Breast cancer (HCC)     Carcinoma, renal cell (HCC) 05/2007    Laparoscopic right nephrectomy   Chronic diastolic CHF (congestive heart failure) (HCC)      a. 9.2014 EchoP EF 60-65%, no rwma, bioprosth AVR, mean gradient of , mild to mod MR, PASP .   CKD (chronic kidney disease), stage IV (HCC)     Colitis, ischemic (HCC) 2008   Coronary artery disease      a. initial CABG with SVG-RCA in 12/05. b. redo SVG-RCA and bioprosthetic AVR in 1/11. d. 9/14 - PCI with DES to distal LM/proximal LAD was done rather than bypass as she would have been a poor candidate for redo sternotomy. d. S/p DES to prox LM 05/2014.   Diabetes mellitus 03/2010     TYPE II: Hemoglobin A1c of  7.4 in  03/2010; 8.4 in 06/2010; treated with insulin pump   Duodenal ulcer      Remote; H. pylori positive   Gastroparesis due to DM (HCC) 05/01/2012   Glaucoma 1998   Hodgkin's disease (HCC) 1991    Mantle radiation therapy   Hyperlipidemia     Hyperplastic gastric polyp 06/23/2012   Hypertension     Iron deficiency anemia      a. 03/2013 EGD: essentially normal. Possible slow GIB.   Migraines     NASH (nonalcoholic steatohepatitis) 1999    -biopsy in 1999   OSA treated with BiPAP     Osteoporosis     Pacemaker      six sinus syndrome   PAF (paroxysmal atrial fibrillation) (HCC)      a.  h/o brief episodes noted on ppm interrogation. b. given GI bleeding and need to be on both ASA 81 and Brilinta, risks likely outweigh benefits of anticoagulation.    Small bowel obstruction (HCC)      Recurrent; resolved after resection of a lipoma   Syncope  a. H/o recurrent syncope with pauses on loop recorder s/p Medtronic pacemaker 07/2012.            FAMILY HISTORY         Family History  Problem Relation Age of Onset   Heart attack Mother          d.61   Heart disease Mother     Anal fissures Mother     Heart attack Father          d.60   Diabetes Father     Heart disease Father     Lung cancer Sister          d.61 metastases to brain. History of smoking. Maternal half-sister.   Cancer Sister 30        unspecified gynecologic cancer (either cervical or ovarian) in her 30s.   Breast cancer Sister 64        d.42 from metastatic disease. Paternal half-sister.   Hypertension Brother     Glaucoma Brother     Parkinson's disease Paternal Aunt     Heart attack Maternal Grandmother     Cancer Maternal Grandfather 50        d.52s oral cancer   Colon cancer Neg Hx     Stomach cancer Neg Hx     Parkinsonism Neg Hx            SOCIAL HISTORY:    Social History         Socioeconomic History   Marital status: Married      Spouse name: Not on file   Number of children: 2    Years of education: Not on file   Highest education level: Doctorate  Occupational History   Occupation: retired      Comment: elementary principal  Tobacco Use   Smoking status: Never   Smokeless tobacco: Never  Vaping Use   Vaping status: Never Used  Substance and Sexual Activity   Alcohol use: No      Comment: social, 10s   Drug use: Never   Sexual activity: Yes  Other Topics Concern   Not on file  Social History Narrative    Retired Programmer, multimedia principal, married     Started disability September 2013         Has one daughter in [place], works at [employer]. Has a son, Arlys John, who is Press photographer.    Eats all food groups.    Wear seatbelt.     Attends church. [place], [place]. [employer].     Social Determinants of Health        Financial Resource Strain: Low Risk  (01/02/2023)    Received from [facility], [facility]    Overall Financial Resource Strain (CARDIA)     Difficulty of Paying Living Expenses: Not hard at all  Food Insecurity: No Food Insecurity (01/02/2023)    Received from [facility], [facility]    Hunger Vital Sign     Worried About Running Out of Food in the Last Year: Never true     Ran Out of Food in the Last Year: Never true  Transportation Needs: No Transportation Needs (01/02/2023)    Received from [facility], [facility]    [facility] Transportation     In the past 12 months, has lack of transportation kept you from medical appointments or from getting medications?: No     Lack of Transportation (  Non-Medical): No  Physical Activity: Insufficiently Active (03/17/2021)    Received from [facility]    Exercise Vital Sign  Stress: Stress Concern Present (03/17/2021)    Received from [facility], [facility]    [employer] [facility] Occupational [facility] - Occupational Stress Questionnaire      Feeling of Stress : To some extent  Social Connections: Not on file  Intimate Partner Violence: Not on file      ALLERGIES:      Allergies       Allergies  Allergen Reactions   Avandia [Rosiglitazone Maleate] Other (See Comments)      Congestive heart failure   Cephalexin Anaphylaxis, Swelling and Rash      Extremities swell , throat swelling     Clindamycin Anaphylaxis and Shortness Of Breath      Other reaction(s): esophageal spasms   Humalog [Insulin Lispro] Itching       big knots and whelp     Lincomycin Anaphylaxis and Shortness Of Breath   Metformin Other (See Comments)      Congestive heart failure   Novolog [Insulin Aspart] Hives and Itching      Humalog & Novolog big knots and whelp       Penicillins Anaphylaxis, Hives, Swelling and Rash      Did it involve swelling of the face/tongue/throat, SOB, or low BP? Yes Did it involve sudden or severe rash/hives, skin peeling, or any reaction on the inside of your mouth or nose? No Did you need to seek medical attention at a hospital or doctor's office? Yes When did it last happen?      Childhood allergy  If all above answers are "NO", may proceed with cephalosporin use.     Tizanidine Other (See Comments)      Dizziness, Mental Status Changes, Hallucination       Adhesive [Tape] Other (See Comments)      Tears skin, Please use "paper" tape   Atorvastatin Other (See Comments)      increased LFT's, no muscle weakness, leg pain Muscle and joint pain increased LFT's   Increased LFTs   Cholestyramine Other (See Comments)      Liver Disorder Elevated LFTs       Gentamycin [Gentamicin] Hives, Itching and Rash      Fine red bumps.  Reaction noted post loop recorder implant, where gentamycin was used for irrigation. Topical prep   Iodinated Contrast Media Other (See Comments)      PAIN DURING LYMPHANGIOGRAM '81, NO ALLERGY TO IV CONTRAST.  Has had procedures with Iodine since without problems.     Limonene Hives,  Itching, Rash and Other (See Comments)      Reaction noted post loop recorder implant, where gentamycin was used for irrigation. Topical prep   Prednisone Itching and Other (See Comments)      B/P went high, itching all over.  Tolerates with benadryl    Ranexa [Ranolazine] Other (See Comments)      Nausea, dizziness, low blood sugar, tingling in right hand, "blood blisters" on tongue    Rosuvastatin Nausea And Vomiting, Nausea Only and Other (See Comments)      Muscle and joint pain & increased LFTs; tolerates Pravastatin 10mg  (max) Other reaction(s): elevated LFTs   Simvastatin Other (See Comments)      Increased LFT's     Azelastine Other (See Comments)      Other reaction(s): metallic taste,spitting up blood   Clopidogrel Other (  See Comments)      Does not work for patient Medicine was not effective Other reaction(s): Unknown   Kerendia [Finerenone] Hives and Nausea Only   Lisinopril Diarrhea, Nausea Only and Other (See Comments)   Codeine Nausea And Vomiting   Erythromycin Nausea And Vomiting   Hydrocodone-Acetaminophen Itching, Rash and Other (See Comments)      itching   Latex Itching and Other (See Comments)      I"if wear gloves hands get red ,itchy no prioblem if other wear and touch me"   Neomycin Rash and Other (See Comments)      Redness   Nickel Itching   Ranitidine Nausea Only   Tamiflu [Oseltamivir] Nausea And Vomiting   Tramadol Nausea Only        CURRENT MEDICATIONS:            Current Outpatient Medications  Medication Sig Dispense Refill   acyclovir ointment (ZOVIRAX) 5 % Apply 1 application topically every 3 (three) hours as needed (fever blister/cold sores).       ALPRAZolam (XANAX) 0.5 MG tablet Take 1 tablet (0.5 mg total) by mouth 2 (two) times daily as needed for anxiety. (Patient taking differently: Take 0.25 mg by mouth at bedtime as needed for anxiety.) 60 tablet 1   APIDRA 100 UNIT/ML injection Inject 40 Units into the skin as directed. INFUSE  THROUGH INSULIN PUMP UTD, Bolus depends on carb intake   2   aspirin EC 81 MG tablet Take 81 mg by mouth at bedtime.       BRILINTA 60 MG TABS tablet TAKE ONE TABLET (60MG  TOTAL) BY MOUTH TWICE DAILY 180 tablet 3   cetirizine (ZYRTEC) 10 MG tablet Take 10 mg by mouth in the morning.       clobetasol (OLUX) 0.05 % topical foam Apply 1 application  topically 2 (two) times daily as needed (skin flaking (legs)).       Coenzyme Q10 (CO Q 10) 100 MG CAPS Take 100 mg by mouth at bedtime.        colchicine 0.6 MG tablet Take 0.6 mg by mouth daily as needed (gout flare).       denosumab (PROLIA) 60 MG/ML SOSY injection Inject 60 mg into the skin every 6 (six) months.       diclofenac Sodium (VOLTAREN) 1 % GEL Apply 2 g topically 4 (four) times daily as needed (pain.).       escitalopram (LEXAPRO) 5 MG tablet Take 5 mg by mouth at bedtime.       famotidine (PEPCID) 40 MG tablet Take 40 mg by mouth at bedtime. Takes a second tablet at night if needed.       gabapentin (NEURONTIN) 100 MG capsule Take 100 mg by mouth daily.       glucagon (GLUCAGON EMERGENCY) 1 MG injection Inject 1 mg into the vein once as needed (low blood sugar).       isosorbide mononitrate (IMDUR) 120 MG 24 hr tablet TAKE ONE TABLET (120MG  TOTAL) BY MOUTH DAILY 90 tablet 3   latanoprost (XALATAN) 0.005 % ophthalmic solution Place 1 drop into both eyes at bedtime.       metolazone (ZAROXOLYN) 2.5 MG tablet Take 2.5 mg (1 tab) once as directed by CHF clinic. Call before taking [phone] 3 tablet 0   metoprolol succinate (TOPROL XL) 25 MG 24 hr tablet Take 1 tablet (25 mg total) by mouth daily. 90 tablet 3   mometasone (NASONEX) 50 MCG/ACT nasal spray Place 1  spray into the nose at bedtime.       mupirocin cream (BACTROBAN) 2 % Apply 1 application topically 2 (two) times daily as needed. 90 g 0   nitroGLYCERIN (NITROLINGUAL) 0.4 MG/SPRAY spray PLACE 1 SPRAY UNDER TH TONGUE EVERY 5 MINUTES FOR 3 DOSES AS NEEDED FOR CHEST PAIN 4.9 g 5    polyethylene glycol (MIRALAX / GLYCOLAX) 17 g packet Take 17 g by mouth in the morning. Hold for loose stools       pravastatin (PRAVACHOL) 10 MG tablet TAKE ONE TABLET (10 MG TOTAL) BY MOUTH DAILY. (Patient taking differently: Take 10 mg by mouth every evening.) 90 tablet 3   Probiotic Product (ALIGN PO) Take 1 tablet by mouth daily in the afternoon.       REPATHA SURECLICK 140 MG/ML SOAJ INJECT 140MG  INTO THE SKIN EVERY 14 DAYS 6 mL 3   silver nitrate applicators 75-25 % applicator Use as needed to stop superficial skin bleeding. 10 each 0   spironolactone (ALDACTONE) 25 MG tablet TAKE ONE-HALF TABLET (12.5MG  TOTAL) BY MOUTH DAILY 45 tablet 0   torsemide (DEMADEX) 20 MG tablet Take 2 tablets (40 mg total) by mouth as directed. 20 mg alternating with 40 mg daily 42 tablet 11   ULORIC 40 MG tablet Take 1 tablet (40 mg total) by mouth at bedtime. 90 tablet 3   VASCEPA 1 g capsule TAKE TWO CAPSULES (2 GRAMS TOTAL) BY MOUTH TWO TIMES DAILY 360 capsule 3      No current facility-administered medications for this visit.        REVIEW OF SYSTEMS:    [X]  denotes positive finding, [ ]  denotes negative finding Cardiac   Comments:  Chest pain or chest pressure:      Shortness of breath upon exertion:      Short of breath when lying flat:      Irregular heart rhythm:             Vascular      Pain in calf, thigh, or hip brought on by ambulation:      Pain in feet at night that wakes you up from your sleep:       Blood clot in your veins:      Leg swelling:              Pulmonary      Oxygen at home:      Productive cough:       Wheezing:              Neurologic      Sudden weakness in arms or legs:       Sudden numbness in arms or legs:       Sudden onset of difficulty speaking or slurred speech:      Temporary loss of vision in one eye:       Problems with dizziness:              Gastrointestinal      Blood in stool:         Vomited blood:              Genitourinary      Burning  when urinating:       Blood in urine:             Psychiatric      Major depression:              Hematologic  Bleeding problems:      Problems with blood clotting too easily:             Skin      Rashes or ulcers:             Constitutional      Fever or chills:        PHYSICAL EXAM:       Vitals:    03/18/23 1133  BP: 126/72  Pulse: 65  Resp: 20  Temp: 98 F (36.7 C)  SpO2: 92%  Weight: 115 lb (52.2 kg)  Height: 5\' 2"  (1.575 m)      GENERAL: The patient is a well-nourished female, in no acute distress. The vital signs are documented above. CARDIAC: There is a regular rate and rhythm.  VASCULAR: Palpable radial pulses PULMONARY: Nonlabored respirations MUSCULOSKELETAL: There are no major deformities or cyanosis. NEUROLOGIC: No focal weakness or paresthesias are detected. SKIN: There are no ulcers or rashes noted. PSYCHIATRIC: The patient has a normal affect.   STUDIES:    I have reviewed the followingL Right Cephalic   Diameter (cm)Depth (cm)Findings  +-----------------+-------------+----------+--------+  Shoulder            0.07        0.31             +-----------------+-------------+----------+--------+  Prox upper arm       0.07        0.27             +-----------------+-------------+----------+--------+  Mid upper arm        0.08        0.30             +-----------------+-------------+----------+--------+  Dist upper arm       0.12        0.32             +-----------------+-------------+----------+--------+  Antecubital fossa    0.24        0.27             +-----------------+-------------+----------+--------+  Prox forearm         0.07        0.46             +-----------------+-------------+----------+--------+  Mid forearm          0.07        0.43             +-----------------+-------------+----------+--------+  Dist forearm         0.08        0.26              +-----------------+-------------+----------+--------+   +-----------------+-------------+----------+--------------+  Right Basilic    Diameter (cm)Depth (cm)   Findings     +-----------------+-------------+----------+--------------+  Prox upper arm       0.34        1.16                   +-----------------+-------------+----------+--------------+  Mid upper arm        0.29        1.16                   +-----------------+-------------+----------+--------------+  Dist upper arm       0.22        0.67     branching     +-----------------+-------------+----------+--------------+  Antecubital fossa    0.16        0.24                   +-----------------+-------------+----------+--------------+  Prox forearm         0.17        0.17     branching     +-----------------+-------------+----------+--------------+  Mid forearm                             not visualized  +-----------------+-------------+----------+--------------+  Distal forearm                          not visualized  +-----------------+-------------+----------+--------------+  Wrist                                  not visualized  +-----------------+-------------+----------+--------------+   +-----------------+-------------+----------+--------+  Left Cephalic    Diameter (cm)Depth (cm)Findings  +-----------------+-------------+----------+--------+  Shoulder            0.15        0.57             +-----------------+-------------+----------+--------+  Prox upper arm       0.16        0.40             +-----------------+-------------+----------+--------+  Mid upper arm        0.17        0.28             +-----------------+-------------+----------+--------+  Dist upper arm       0.19        0.30             +-----------------+-------------+----------+--------+  Antecubital fossa    0.24        0.28              +-----------------+-------------+----------+--------+  Prox forearm         0.14        0.49             +-----------------+-------------+----------+--------+  Mid forearm          0.11        0.31             +-----------------+-------------+----------+--------+  Dist forearm         0.10        0.26             +-----------------+-------------+----------+--------+  Wrist               0.13        0.24             +-----------------+-------------+----------+--------+   +-----------------+-------------+----------+--------------+  Left Basilic     Diameter (cm)Depth (cm)   Findings     +-----------------+-------------+----------+--------------+  Prox upper arm       0.19        0.98                   +-----------------+-------------+----------+--------------+  Mid upper arm        0.14        0.63                   +-----------------+-------------+----------+--------------+  Dist upper arm       0.14        0.56                   +-----------------+-------------+----------+--------------+  Antecubital fossa    0.10        0.42                   +-----------------+-------------+----------+--------------+  Prox forearm                            not visualized  +-----------------+-------------+----------+--------------+  Mid forearm                             not visualized  +-----------------+-------------+----------+--------------+  Distal forearm                          not visualized  +-----------------+-------------+----------+--------------+  Elbow                                  not visualized  +-----------------+-------------+----------+--------------+  Wrist                                  not visualized  +-----------------+-------------+----------+--------------+      ASSESSMENT and PLAN    CKD4: The patient surface veins are too small for fistula creation.  She is right-handed but has a left-sided  pacemaker and so I would recommend a right upper arm graft.  I discussed the risks of the operation include the risk of steal, and the need for future interventions as well as graft failure.  I told her that I would consider placing a fistula in the operating room if her veins appeared to be larger than what we saw today.  I will wait until I get Dr. Garlon Hatchet approval to proceed with a graft before placing this.  Patient is scheduled to see her next week.  Prior to any procedure, she would need to be off of her Brilinta for a week     Durene Cal, IV, MD, FACS [facility] [phone] Pager [phone]    Also discussed placing a TDC for initiation of HD  WB

## 2023-05-01 NOTE — Anesthesia Postprocedure Evaluation (Signed)
Anesthesia Post Note  Patient: [name] [name]  Procedure(s) Performed: RIGHT UPPER ARM BRACHIOBASILIC ARTERIOVENOUS FISTULA CREATION (Right: Arm Upper) INSERTION OF TUNNELED DIALYSIS CATHETER USING PALINDROME CATHETER 23CM (Right: Neck)     Patient location during evaluation: PACU Anesthesia Type: General Level of consciousness: awake and alert Pain management: pain level controlled Vital Signs Assessment: post-procedure vital signs reviewed and stable Respiratory status: spontaneous breathing, nonlabored ventilation, respiratory function stable and patient connected to nasal cannula oxygen Cardiovascular status: blood pressure returned to baseline and stable Postop Assessment: no apparent nausea or vomiting Anesthetic complications: no   No notable events documented.  Last Vitals:  Vitals:   05/01/23 1100 05/01/23 1108  BP: 139/63 (!) 140/58  Pulse: 84 85  Resp: 11 20  Temp:  36.5 C  SpO2: 93% 92%    Last Pain:  Vitals:   05/01/23 1108  TempSrc:   PainSc: 0-No pain                 Earl Lites P Ercia Crisafulli

## 2023-05-01 NOTE — Op Note (Signed)
Patient name: [name] [name] MRN: [id number] DOB: 30-Sep-1950 Sex: female  05/01/2023 Pre-operative Diagnosis: CKD 5 Post-operative diagnosis:  Same Surgeon:  Durene Cal Assistants:  Antony Blackbird, PA Procedure:   #1: Ultrasound-guided access, right internal jugular vein  #2: Placement of right sided 23 cm tunneled dialysis catheter using fluoroscopic imaging    #3: Right brachiocephalic fistula Anesthesia:  general Blood Loss:  minimal Specimens:  none  Findings: Basilic vein measured approximately 3 mm.  The artery was mildly calcified and 3 mm  Indications: This is a 72 year old female who is getting ready to start dialysis.  She had small surface veins on preoperative ultrasound.  She now needs a catheter.  She has a pacemaker on the left side.  Procedure:  The patient was identified in the holding area and taken to [facility] OR ROOM 16  The patient was then placed supine on the table. general anesthesia was administered.  The patient was prepped and draped in the usual sterile fashion.  A time out was called and antibiotics were administered.  An assistant was necessary to expedite the procedure and assist with technical details.  She help with exposure by providing suction and retraction.  She help with the anastomosis by following the suture.  She help with wound closure.  Ultrasound was used to evaluate the right internal jugular vein which was widely patent and easily compressible.  A #11 blade was used to make a skin nick.  The right internal jugular vein was cannulated under ultrasound guidance with a micropuncture needle.  A 018 wire was inserted without resistance followed by placement of a micropuncture sheath.  Next, a 035 wire was placed centrally.  The subcutaneous tract was dilated with sequential dilators and a peel-away sheath was selected.  Next, a skin exit site was selected below the clavicle.  A tunnel was created between the 2 incisions.  The cath was brought through the  tunnel and fed through the peel-away sheath which was removed.  The catheter tip was at the cavoatrial junction.  There were no kinks within the catheter.  Both ports flushed and aspirated without difficulty.  The catheter sutured in position with 3-0 nylon and the neck incision was closed with 4-0 Vicryl followed by Dermabond.  The catheter was filled with the appropriate volume of heparin.  Next, attention was turned towards the right arm.  A separate timeout was called.  SonoSite was used to evaluate the right basilic vein which appeared to be 3 mm and adequate for fistula creation.  A transverse incision was made just proximal to the antecubital crease.  First dissected out the brachial artery which was mildly calcified.  It was encircled with a vessel loop.  I then dissected out the left basilic vein.  There was 1 large posterior branch divided between silk ties.  The vein was then ligated after marking it for orientation.  It distended appropriately with heparin saline.  The brachial artery was occluded with vascular clamps and a #11 blade was used to make an arteriotomy which was extended longitudinally with Potts scissors.  The vein was then spatulated and cut to fit the size the arteriotomy.  A running anastomosis was gray with 6-0 Prolene.  Prior to completion, the appropriate flushing maneuvers were performed and the anastomosis was completed.  Blood flow was reestablished to the hand and through the fistula.  There was an excellent thrill within the fistula and a palpable radial pulse.  The wound was irrigated.  Hemostasis was achieved.  The incision was closed with 2 layers of Vicryl followed by Dermabond.  The patient successfully extubated taken recovery in stable condition.   Disposition: To PACU stable.   Juleen China, M.D., [facility] [place] Office: [phone] Pager:  [phone]

## 2023-05-01 NOTE — Progress Notes (Signed)
Pt with medtronic pacemaker. Pt states that her doctor was notified of procedure and did not provide device instructions d/t pt not having seen MD in over a year. Per husband, pt is not pacemaker dependent. Dr Nance Pew notified. No pacemaker rep needed prior to surgeries.   Pt with insulin pump with Apidra insulin. Pt states she has her insulin pump supplies and extra Apidra with her. Pt reports she reduced her basal rate at bedtime by 20%, but her bolus rate is on a schedule that changed automatically at [facility]. Pt reports that her blood sugar normally goes down in the morning and 119 is on the lower side for her. Pt states suspended her basal rate in pre-op. Dr. Nance Pew aware. Dr Nance Pew to follow up with patient regarding pump settings and use prior to surgery.

## 2023-05-01 NOTE — Progress Notes (Signed)
Orthopedic Tech Progress Note Patient Details:  [name] [name] Nov 11, 1950 [id number]  Ortho Devices Type of Ortho Device: Arm sling Ortho Device/Splint Location: rue Ortho Device/Splint Interventions: Ordered, Application, Adjustment   Post Interventions Patient Tolerated: Well Instructions Provided: Care of device, Adjustment of device  Trinna Post 05/01/2023, 12:00 PM

## 2023-05-01 NOTE — Discharge Instructions (Signed)
[facility]  Discharge Instructions  AV Fistula or Graft Surgery for Dialysis Access  Please refer to the following instructions for your post-procedure care. Your surgeon or physician assistant will discuss any changes with you.  Activity  You may drive the day following your surgery, if you are comfortable and no longer taking prescription pain medication. Resume full activity as the soreness in your incision resolves.  Bathing/Showering  You may shower after you go home. Keep your incision dry for 48 hours. Do not soak in a bathtub, hot tub, or swim until the incision heals completely. You may not shower if you have a hemodialysis catheter.  Incision Care  Clean your incision with mild soap and water after 48 hours. Pat the area dry with a clean towel. You do not need a bandage unless otherwise instructed. Do not apply any ointments or creams to your incision. You may have skin glue on your incision. Do not peel it off. It will come off on its own in about one week. Your arm may swell a bit after surgery. To reduce swelling use pillows to elevate your arm so it is above your heart. Your doctor will tell you if you need to lightly wrap your arm with an ACE bandage.  Diet  Resume your normal diet. There are not special food restrictions following this procedure. In order to heal from your surgery, it is CRITICAL to get adequate nutrition. Your body requires vitamins, minerals, and protein. Vegetables are the best source of vitamins and minerals. Vegetables also provide the perfect balance of protein. Processed food has little nutritional value, so try to avoid this.  Medications  Resume taking all of your medications. If your incision is causing pain, you may take over-the counter pain relievers such as acetaminophen (Tylenol). If you were prescribed a stronger pain medication, please be aware these medications can cause nausea and constipation. Prevent  nausea by taking the medication with a snack or meal. Avoid constipation by drinking plenty of fluids and eating foods with high amount of fiber, such as fruits, vegetables, and grains.  Do not take Tylenol if you are taking prescription pain medications.  Follow up Your surgeon may want to see you in the office following your access surgery. If so, this will be arranged at the time of your surgery.  Please call us immediately for any of the following conditions:  Increased pain, redness, drainage (pus) from your incision site Fever of 101 degrees or higher Severe or worsening pain at your incision site Hand pain or numbness.  Reduce your risk of vascular disease:  Stop smoking. If you would like help, call [facility] at 1-800-QUIT-NOW (414-144-1723) or  at [phone]  Manage your cholesterol Maintain a desired weight Control your diabetes Keep your blood pressure down  Dialysis  It will take several weeks to several months for your new dialysis access to be ready for use. Your surgeon will determine when it is okay to use it. Your nephrologist will continue to direct your dialysis. You can continue to use your Permcath until your new access is ready for use.   05/01/2023 [name] [name] [id number] 1951-07-07  Surgeon(s): Nada Libman, MD  Procedure(s): RIGHT UPPER ARM BRACHIOBASILIC ARTERIOVENOUS FISTULA CREATION INSERTION OF TUNNELED DIALYSIS CATHETER USING PALINDROME CATHETER 23CM   May stick graft immediately   May stick graft on designated area only:   x Do not stick fistula for 12 weeks    If  you have any questions, please call the office at [phone].

## 2023-05-01 NOTE — Transfer of Care (Signed)
Immediate Anesthesia Transfer of Care Note  Patient: [name] [name]  Procedure(s) Performed: RIGHT UPPER ARM BRACHIOBASILIC ARTERIOVENOUS FISTULA CREATION (Right: Arm Upper) INSERTION OF TUNNELED DIALYSIS CATHETER USING PALINDROME CATHETER 23CM (Right: Neck)  Patient Location: PACU  Anesthesia Type:General  Level of Consciousness: drowsy, patient cooperative, and responds to stimulation  Airway & Oxygen Therapy: Patient Spontanous Breathing and Patient connected to face mask oxygen  Post-op Assessment: Report given to RN and Post -op Vital signs reviewed and stable  Post vital signs: Reviewed and stable  Last Vitals:  Vitals Value Taken Time  BP 161/68 05/01/23 1048  Temp    Pulse 84 05/01/23 1053  Resp 24 05/01/23 1053  SpO2 96 % 05/01/23 1053  Vitals shown include unfiled device data.    Complications: No notable events documented.

## 2023-05-01 NOTE — Anesthesia Procedure Notes (Signed)
Procedure Name: LMA Insertion Date/Time: 05/01/2023 8:52 AM  Performed by: Earlene Plater, CRNAPre-anesthesia Checklist: Patient identified, Emergency Drugs available, Suction available, Patient being monitored and Timeout performed Patient Re-evaluated:Patient Re-evaluated prior to induction Oxygen Delivery Method: Circle system utilized Preoxygenation: Pre-oxygenation with 100% oxygen Induction Type: IV induction Ventilation: Mask ventilation without difficulty LMA: LMA inserted LMA Size: 4.0 Number of attempts: 1 Placement Confirmation: positive ETCO2 and breath sounds checked- equal and bilateral Tube secured with: Tape (Confirmed by Dr. Nance Pew) Dental Injury: Teeth and Oropharynx as per pre-operative assessment

## 2023-05-02 ENCOUNTER — Ambulatory Visit: Payer: Medicare PPO | Attending: Internal Medicine | Admitting: Internal Medicine

## 2023-05-02 ENCOUNTER — Encounter (HOSPITAL_COMMUNITY): Payer: Self-pay | Admitting: Surgery

## 2023-05-02 DIAGNOSIS — N186 End stage renal disease: Secondary | ICD-10-CM | POA: Diagnosis not present

## 2023-05-02 DIAGNOSIS — Z992 Dependence on renal dialysis: Secondary | ICD-10-CM | POA: Diagnosis not present

## 2023-05-02 DIAGNOSIS — I5030 Unspecified diastolic (congestive) heart failure: Secondary | ICD-10-CM | POA: Diagnosis not present

## 2023-05-03 DIAGNOSIS — N186 End stage renal disease: Secondary | ICD-10-CM | POA: Diagnosis not present

## 2023-05-03 DIAGNOSIS — Z992 Dependence on renal dialysis: Secondary | ICD-10-CM | POA: Diagnosis not present

## 2023-05-03 DIAGNOSIS — I5030 Unspecified diastolic (congestive) heart failure: Secondary | ICD-10-CM | POA: Diagnosis not present

## 2023-05-04 ENCOUNTER — Encounter: Payer: Self-pay | Admitting: Internal Medicine

## 2023-05-04 ENCOUNTER — Encounter (HOSPITAL_COMMUNITY): Payer: Self-pay | Admitting: Cardiology

## 2023-05-06 DIAGNOSIS — Z992 Dependence on renal dialysis: Secondary | ICD-10-CM | POA: Diagnosis not present

## 2023-05-06 DIAGNOSIS — N186 End stage renal disease: Secondary | ICD-10-CM | POA: Diagnosis not present

## 2023-05-06 DIAGNOSIS — I5030 Unspecified diastolic (congestive) heart failure: Secondary | ICD-10-CM | POA: Diagnosis not present

## 2023-05-07 ENCOUNTER — Encounter: Payer: Self-pay | Admitting: Cardiology

## 2023-05-07 DIAGNOSIS — I5030 Unspecified diastolic (congestive) heart failure: Secondary | ICD-10-CM | POA: Diagnosis not present

## 2023-05-07 DIAGNOSIS — Z992 Dependence on renal dialysis: Secondary | ICD-10-CM | POA: Diagnosis not present

## 2023-05-07 DIAGNOSIS — N186 End stage renal disease: Secondary | ICD-10-CM | POA: Diagnosis not present

## 2023-05-08 ENCOUNTER — Ambulatory Visit (HOSPITAL_COMMUNITY)
Admission: RE | Admit: 2023-05-08 | Discharge: 2023-05-08 | Disposition: A | Discharge status: Home / Self Care | Payer: Medicare PPO | Source: Ambulatory Visit | Attending: Cardiology | Admitting: Cardiology

## 2023-05-08 ENCOUNTER — Other Ambulatory Visit (HOSPITAL_COMMUNITY): Payer: Self-pay | Admitting: Cardiology

## 2023-05-08 ENCOUNTER — Inpatient Hospital Stay (HOSPITAL_COMMUNITY)
Admission: RE | Admit: 2023-05-08 | Discharge: 2023-05-08 | Disposition: A | Discharge status: Home / Self Care | Payer: Medicare PPO | Source: Ambulatory Visit | Attending: Cardiology | Admitting: Cardiology

## 2023-05-08 ENCOUNTER — Encounter (HOSPITAL_COMMUNITY): Payer: Self-pay | Admitting: Cardiology

## 2023-05-08 VITALS — BP 110/50 | HR 92 | Wt 120.8 lb

## 2023-05-08 DIAGNOSIS — I2510 Atherosclerotic heart disease of native coronary artery without angina pectoris: Secondary | ICD-10-CM

## 2023-05-08 DIAGNOSIS — K7469 Other cirrhosis of liver: Secondary | ICD-10-CM | POA: Insufficient documentation

## 2023-05-08 DIAGNOSIS — I130 Hypertensive heart and chronic kidney disease with heart failure and stage 1 through stage 4 chronic kidney disease, or unspecified chronic kidney disease: Secondary | ICD-10-CM | POA: Insufficient documentation

## 2023-05-08 DIAGNOSIS — G4733 Obstructive sleep apnea (adult) (pediatric): Secondary | ICD-10-CM | POA: Insufficient documentation

## 2023-05-08 DIAGNOSIS — Z950 Presence of cardiac pacemaker: Secondary | ICD-10-CM | POA: Diagnosis not present

## 2023-05-08 DIAGNOSIS — I4891 Unspecified atrial fibrillation: Secondary | ICD-10-CM

## 2023-05-08 DIAGNOSIS — D631 Anemia in chronic kidney disease: Secondary | ICD-10-CM | POA: Diagnosis not present

## 2023-05-08 DIAGNOSIS — I495 Sick sinus syndrome: Secondary | ICD-10-CM | POA: Diagnosis not present

## 2023-05-08 DIAGNOSIS — D472 Monoclonal gammopathy: Secondary | ICD-10-CM | POA: Insufficient documentation

## 2023-05-08 DIAGNOSIS — R9431 Abnormal electrocardiogram [ECG] [EKG]: Secondary | ICD-10-CM | POA: Insufficient documentation

## 2023-05-08 DIAGNOSIS — I5032 Chronic diastolic (congestive) heart failure: Secondary | ICD-10-CM | POA: Diagnosis not present

## 2023-05-08 DIAGNOSIS — D696 Thrombocytopenia, unspecified: Secondary | ICD-10-CM | POA: Insufficient documentation

## 2023-05-08 DIAGNOSIS — D500 Iron deficiency anemia secondary to blood loss (chronic): Secondary | ICD-10-CM | POA: Diagnosis not present

## 2023-05-08 DIAGNOSIS — Z992 Dependence on renal dialysis: Secondary | ICD-10-CM | POA: Insufficient documentation

## 2023-05-08 DIAGNOSIS — Z955 Presence of coronary angioplasty implant and graft: Secondary | ICD-10-CM | POA: Insufficient documentation

## 2023-05-08 DIAGNOSIS — I425 Other restrictive cardiomyopathy: Secondary | ICD-10-CM | POA: Insufficient documentation

## 2023-05-08 DIAGNOSIS — Z954 Presence of other heart-valve replacement: Secondary | ICD-10-CM | POA: Insufficient documentation

## 2023-05-08 DIAGNOSIS — I25110 Atherosclerotic heart disease of native coronary artery with unstable angina pectoris: Secondary | ICD-10-CM | POA: Diagnosis not present

## 2023-05-08 DIAGNOSIS — Z7902 Long term (current) use of antithrombotics/antiplatelets: Secondary | ICD-10-CM | POA: Diagnosis not present

## 2023-05-08 DIAGNOSIS — Z951 Presence of aortocoronary bypass graft: Secondary | ICD-10-CM | POA: Diagnosis not present

## 2023-05-08 DIAGNOSIS — N186 End stage renal disease: Secondary | ICD-10-CM | POA: Insufficient documentation

## 2023-05-08 DIAGNOSIS — I342 Nonrheumatic mitral (valve) stenosis: Secondary | ICD-10-CM | POA: Insufficient documentation

## 2023-05-08 DIAGNOSIS — I5022 Chronic systolic (congestive) heart failure: Secondary | ICD-10-CM | POA: Diagnosis not present

## 2023-05-08 DIAGNOSIS — E785 Hyperlipidemia, unspecified: Secondary | ICD-10-CM | POA: Insufficient documentation

## 2023-05-08 DIAGNOSIS — I480 Paroxysmal atrial fibrillation: Secondary | ICD-10-CM | POA: Diagnosis not present

## 2023-05-08 DIAGNOSIS — I200 Unstable angina: Secondary | ICD-10-CM

## 2023-05-08 MED ORDER — TORSEMIDE 20 MG PO TABS: ORAL_TABLET | ORAL | 3 refills | Status: DC

## 2023-05-08 MED ORDER — ISOSORBIDE MONONITRATE ER 120 MG PO TB24: 120.0000 mg | ORAL_TABLET | Freq: Every morning | ORAL | Status: DC

## 2023-05-08 MED ORDER — MIDODRINE HCL 5 MG PO TABS: 5.0000 mg | ORAL_TABLET | Freq: Every day | ORAL | 3 refills | Status: DC | PRN

## 2023-05-08 NOTE — Patient Instructions (Signed)
Medication Changes:  Change Imdur to take in the morning   Increase Torsemide to 40 mg Twice daily on NON HD days  Take Midodrine 5 mg before HD  Lab Work:  none  Testing/Procedures:  Your provider has recommended that  you wear a Zio Patch for 14 days.  This monitor will record your heart rhythm for our review.  IF you have any symptoms while wearing the monitor please press the button.  If you have any issues with the patch or you notice a red or orange light on it please call the company at [phone].  Once you remove the patch please mail it back to the company as soon as possible so we can get the results.  Cardiac PET Scan has been ordered, you will be called to schedule this, please see instructions below  Referrals:  none  Special Instructions // Education:  Do the following things EVERYDAY: Weigh yourself in the morning before breakfast. Write it down and keep it in a log. Take your medicines as prescribed Eat low salt foods--Limit salt (sodium) to 2000 mg per day.  Stay as active as you can everyday Limit all fluids for the day to less than 2 liters  How to Prepare for Your Cardiac PET/CT Stress Test:  1. Please do not take these medications before your test:   AM of test DO NOT TAKE IMDUR (Isosorbide) Your remaining medications may be taken with water.  2. Nothing to eat or drink, except water, 3 hours prior to arrival time.   NO caffeine/decaffeinated products, or chocolate 12 hours prior to arrival.  3. NO perfume, cologne or lotion on chest or abdomen area.          - FEMALES - Please avoid wearing dresses to this appointment.  4. Total time is 1 to 2 hours; you may want to bring reading material for the waiting time.  5. Please report to Radiology at the [facility] Main Entrance 30 minutes early for your test.  [address] [place], [place] [address]  Diabetic Preparation:  Hold oral medications. You may take NPH and Lantus  insulin. Do not take Apidra AM of test Check blood sugars prior to leaving the house. If able to eat breakfast prior to 3 hour fasting, you may take all medications, including your insulin, Do not worry if you miss your breakfast dose of insulin - start at your next meal. Patients who wear a continuous glucose monitor MUST remove the device prior to scanning.  IF YOU THINK YOU MAY BE PREGNANT, OR ARE NURSING PLEASE INFORM THE TECHNOLOGIST.  In preparation for your appointment, medication and supplies will be purchased.  Appointment availability is limited, so if you need to cancel or reschedule, please call the Radiology Department at [phone] [facility]) OR [phone] [facility])  24 hours in advance to avoid a cancellation fee of $100.00  What to Expect After you Arrive:  Once you arrive and check in for your appointment, you will be taken to a preparation room within the Radiology Department.  A technologist or Nurse will obtain your medical history, verify that you are correctly prepped for the exam, and explain the procedure.  Afterwards,  an IV will be started in your arm and electrodes will be placed on your skin for EKG monitoring during the stress portion of the exam. Then you will be escorted to the PET/CT scanner.  There, staff will get you positioned on the scanner and obtain a  blood pressure and EKG.  During the exam, you will continue to be connected to the EKG and blood pressure machines.  A small, safe amount of a radioactive tracer will be injected in your IV to obtain a series of pictures of your heart along with an injection of a stress agent.    After your Exam:  It is recommended that you eat a meal and drink a caffeinated beverage to counter act any effects of the stress agent.  Drink plenty of fluids for the remainder of the day and urinate frequently for the first couple of hours after the exam.  Your doctor will inform you of your test results within 7-10 business  days.  For more information and frequently asked questions, please visit our website : [email or web address]  For questions about your test or how to prepare for your test, please call: Cardiac Imaging Nurse Navigators Office: [phone]   Follow-Up in: 1 month and again in 2 months with echo   At the [facility], you and your health needs are our priority. We have a designated team specialized in the treatment of Heart Failure. This Care Team includes your primary Heart Failure Specialized Cardiologist (physician), Advanced Practice Providers (APPs- Physician Assistants and Nurse Practitioners), and Pharmacist who all work together to provide you with the care you need, when you need it.   You may see any of the following providers on your designated Care Team at your next follow up:  Dr. Arvilla Meres Dr. Marca Ancona Dr. Dorthula Nettles Dr. Theresia Bough Tonye Becket, NP Robbie Lis, [place] [facility] [place], [place] Brynda Peon, NP [place] Lee, NP Karle Plumber, PharmD   Please be sure to bring in all your medications bottles to every appointment.   Need to Contact us:  If you have any questions or concerns before your next appointment please send [name] a message through [place] or call our office at [phone].    TO LEAVE A MESSAGE FOR THE NURSE SELECT OPTION 2, PLEASE LEAVE A MESSAGE INCLUDING: YOUR NAME DATE OF BIRTH CALL BACK NUMBER REASON FOR CALL**this is important as we prioritize the call backs  YOU WILL RECEIVE A CALL BACK THE SAME DAY AS LONG AS YOU CALL BEFORE 4:00 PM

## 2023-05-08 NOTE — Progress Notes (Signed)
Date:  05/08/2023   ID:  [name] [name], DOB Nov 09, 1950, MRN [id number]   Provider location: [address], [place] Type of Visit: Established patient  PCP:  Aliene Beams, MD  Cardiologist:  None Primary HF: Dr. Shirlee Latch   History of Present Illness: [name] [name] is a 72 y.o. female who has a history of CAD s/p CABG in 12/05 and redo in 1/11, bioprosthetic AVR, diastolic CHF, and sick sinus syndrome s/p PPM presents for cardiology followup.  She had initial CABG with SVG-RCA in 12/05 followed by redo SVG-RCA and bioprosthetic AVR in 1/11.  She has had diastolic CHF and is on Lasix.  In 9/14, she had a CPX test.  This showed severe functional limitation with ischemic ECG changes (inferolateral ST depression and ST elevation in V1 and V2). She had chest pain.  She had R/LHC in 9/14.  This showed elevated left and right heart filling pressures, patent SVG-PDA, and 80% distal LM/80% ostial LAD stenosis.  She was not a candidate for redo CABG (had 2 prior sternotomies as well as chest radiation for Hodgkins lymphoma).  She had DES from left main into proximal LAD and will need long-term DAPT => Brilinta was used as she has been a poor responder to Plavix in the past.  She re-developed angina in 11/15 and was admitted with unstable angina.  LHC showed 80% ostial LM and patent SVG-RCA. She had DES to ostial LM. Echo 1/18 showed EF 60-65%, normal bioprosthetic aortic valve, mild-moderate MR, PASP 35 mmHg.    She has chronic iron deficiency anemia thought to be due to a slow GI bleed.  Last EGD was unremarkable and a capsule endoscopy was also unremarkable.  She has had periodic transfusions.     She had bilateral mastectomy for DCIS in 10/16 without complication.   She has an insulin pump followed by an endocrinologist at [facility]    Given myalgias with statins, she was started on Praluent.  LDL is now excellent.     In the past she has had short runs of atrial fibrillation.  She is  not anticoagulated. Planned for Watchman placement, but given her nickel allergy, we were unable to place Watchman.    Diagnosed with OSA, now on CPAP.    Given ongoing problems with exertional dyspnea and occasional chest pain, Cardiolite was repeated in 9/18 and showed inferior changes.  She underwent left/right heart cath in 9/18.  This showed elevated filling pressures, R>L.  There was 75+% ostial LCx stenosis.  However, there were 2 layers of stent from the mid left main into the ostial LAD, and LCx intervention was thought to be very risky.  It was decided to manage her medically.  Echo was repeated in 10/18 showing EF 55-60%, moderate to severe TR.     Given recurrent chest pain, she had ETT-Cardiolite done in 3/19.  This was a normal study with no evidence for ischemia or infarction.  Echo in 1/20 showed EF 55-60%, mild LVH, stable bioprosthetic aortic valve, moderate-severe TR.    Due to increased dyspnea and worsening renal function, she was set up for echo and RHC.  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  RHC in 8/20 showed primarily right-sided HF.  Cardiolite in 8/20 showed no ischemia or infarction.  Torsemide was increased and she cut back on sodium in her diet.    CPX in 11/20 showed severe functional limitation from HF.   She saw Dr. Edwena Blow at [facility] for evaluation for heart/kidney transplant (for restrictive cardiomyopathy).  She was determined to be a poor surgical candidate due to extensive vascular calcifications.   CT chest in 8/21 showed a loculated right pleural effusion but unable to find enough fluid by ultrasound for thoracentesis.   Echo in 9/21 showed EF 60-65% with mild LVH, mild RV dilation with mildly decreased systolic function and a  D-shaped septum, PASP 40, normal bioprosthetic aortic valve, rheumatic-appearing MV with mild-moderate mitral stenosis (mean gradient 9 but MVA by PHT 2.7 cm^2), trivial MR.    Echo 2/23, EF 60-65%, mild LVH, normal RV systolic function but with D-shaped septum, normal bioprosthetic aortic valve, mild mitral stenosis (rheumatic-appearing valve, mean gradient 4 mmHg), mild MR.   Seen by hematology 10/23. Labs showed mild hypercalcemia. Calcium level 10.4 with albumin 4.1. SPEP showed M spike.  Kappa/lambda light chain ratio 2.17. Immunofixation showed polyclonal gammopathy. Plan is to return in 6 months to repeat myeloma labs. She has not had a cMRI due to PPM.   Echo at [facility] in 12/23 showed LVEF 55%, AoV bioprosthesis functioning normal. RV systolic function reported to be normal. Moderate TR noted. No mention of mitral stenosis.   Patient started HD in 10/24. Atrial fibrillation noted during her first HD session, only lasted a few minutes.   Today she returns for followup of CHF.  She has been struggling with HD.  She noted 5-10 minutes of AF on her Apple Watch during her first HD session.  She is more short of breath than in the past, she gets dyspnea walking around the house (10-15 yards).  She says that they have not been removing much fluid at HD due to lightheadedness/low BP during HD.  After HD, she will get prolonged episodes of chest dull aching and will take NTG. She is taking Lasix 40 mg daily on non-HD days. Weight is up 3 lbs.   ECG (personally reviewed): NSR, long 1st degree, LVH  MDT PPM interrogation: Rare short AF runs (generally < 10 minutes).    Labs @ [facility] (3/19): K 4.7, creatinine 1.4, ANA negative, RF negative Labs (3/20): K 4, creatinine 1.8 Labs (6/20): K 4.7, creatinine 1.47 Labs (8/20): creatinine 2.13 => 2.46 => 2.4, LDL 22 Labs (9/20): K 4.8, creatinine 2.74, AST/ALT normal, hgb 12.6 Labs (1/21): K 4.1, creatinine 1.86, hgb 11.7 Labs (4/21): K 4.7, creatinine  1.7 Labs (6/21): K 4.2, creatinine 2 => 1.7 Labs (8/21): K 3.8, creatinine 1.69, hgb 10.6, plts 116 Labs (9/21): LDL 34, TGs 209 Labs (1/22): K 3.8, creatinine 2.0 Labs (3/22): LDL 25 Labs (4/22): K 4.7, creatinine 2.0, hgb 12.3 Labs (5/22): K 4.4, creatinine 2.21 Labs (8/22): LDL 1, TGs 330 Labs (10/22): K 4.6, creatinine 1.87 Labs (11/22): LDL 26 Labs (12/22): K 4.1, creatinine 2.25 Labs (1/23): creatinine 2.65 Labs (3/23): K 4.6, creatinine 2.8 Labs (4/23): K 4.1, creatinine 2.16, BNP 94 Labs (10/23): K 4.2, creatinine 1.62, calcium 10.4  Labs (12/23): K 4.1, creatinine 2.23 Labs (4/24): K 4.6, SCr 2.6, hgb 12.5 Labs (5/24): K 4.4, creatinine 2.85 Labs (6/24): LDL 24, BNP 118 Labs (9/24): K 4.7, creatinine 2.5   PMH: 1. Sick sinus syndrome s/p [employer] PCM.  2. HTN 3. H/o SBO 4. Renal cell carcinoma s/p right nephrectomy in 2008.  5. NAFLD: Biopsy in  1999 made diagnosis.  Fibroscan in 11/14 showed advanced fibrosis concerning for cirrhosis. EGD in 11/14 showed no varices.  6. Diastolic CHF: Echo (4/14) with EF 60-65%, bioprosthetic aortic valve with mean gradient 15 mmHg, mild MR.  CPX (9/14): peak VO2 10.4, VE/VCO2 43.8, inferolateral ST depression and ST elevation in V1/V2 with severe dyspnea and chest pain, severe functional limitation with ischemic changes.  Echo (9/14) with EF 60-65%, normal RV size and systolic function, mild-moderate MR, bioprosthetic aortic valve normal, PA systolic pressure 51 mmHg, no evidence for pericardial constriction though exam incomplete for constriction.  Echo (11/15) with EF 55-60%, bioprosthetic aortic valve, mild-moderate MR, moderate TR, PA systolic pressure 42 mmHg.  - TEE (5/17) with EF 60-65%, normal bioprosthetic aortic valve with mean gradient 13 mmHg, normal RV size and systolic function. - Echo (1/18) with EF 60-65%, normal bioprosthetic aortic valve, mild-moderate MR, PASP 35 mmHg.  - Echo (10/18) with EF 55-60%, bioprosthetic aortic  valve mean gradient 10 mmHg, MAC with mild mitral stenosis (mean gradient 7) and mild mitral regurgitation, moderate to severe TR, PASP 43 mmHg.  - RHC (9/18): mean RA 13, PA 44/18 mean 33, mean PCWP 19, CI 3.45, PVR 2.7 WU.  - Echo (1/20): EF 55-60%, mild LVH, mild MR, stable bioprosthetic aortic valve, moderate-severe TR.  - RHC (8/20): mean RA 14, PA 43/15 mean 28, mean PCWP 13, PAPI 2, PVR 4.2, CI 2.31 - Echo (8/20): EF 55-60%, RV mildly dilated with normal systolic function, mildly D-shaped septum suggestive of RV pressure/volume overload, rheumatic-appearing mitral valve with mild mitral stenosis (mean gradient 6, MVA by PHT 4 cm^2), no evidence for pericardial constriction, moderate TR, bioprosthetic aortic valve functioning normally with mean gradient 6 mmHg.  - CPX (11/20): peak VO2 9.8, VE/VCO2 slope 38, RER 1.11 => severe functional limitation from HF.   - Echo (9/21): EF 60-65% with mild LVH, mild RV dilation with mildly decreased systolic function and a D-shaped septum, PASP 40, normal bioprosthetic aortic valve, rheumatic-appearing MV with mild-moderate mitral stenosis (mean gradient 9 but MVA by PHT 2.7 cm^2, trivial MR.  - Echo (2/23): EF 60-65%, mild LVH, normal RV systolic function but with D-shaped septum, normal bioprosthetic aortic valve, mild mitral stenosis (rheumatic-appearing valve, mean gradient 4 mmHg), mild MR.  - RHC 1/24: Elevated right heart filling pressures out of proportion to PCWP suggesting primarily RV failure. Mild PAH. Restrictive filling picture. Low cardiac index at 1.71. (RA 13, PA 43/15, mean 25, PCWP 12 with v waves to 20, CO 2.54, CI 1.71) - Echo (12/23, [facility]): LVEF 55%, AoV bioprosthesis functioning normal. RV systolic function reported to be normal. Moderate TR noted. No mention of mitral stenosis.  7. TAH-BSO 8. Type I diabetes: Has insulin pump.  9. H/o ischemic colitis. 10. H/o PUD. 11. Diabetic gastroparesis. 12. Hyperlipidemia: Myalgias with Crestor  and atorvastatin, elevated LFTs with simvastatin.  Myalgias with > 10 mg pravastatin. Now on Praluent.  13. ESRD 14. Bicuspid aortic valve s/p 19 mm Edwards pericardial valve in 1/11 (had post-operative Dresslers syndrome).   15. CAD: CABG with SVG-RCA in 12/05.  Redo SVG-RCA in 1/11 with AVR.  LHC/RHC (9/14) with mean RA 18, PA 62/25 mean 43, mean PCWP 26, CI 2.8, 70-80% distal LM stenosis, 80% ostial LAD stenosis, total occlusion RCA, SVG-PDA patent.  Patient had DES LM into LAD.  Plan to continue long-term ASA/Brilinta (poor responder to Plavix).  ETT-cardiolite (4/15) with 6' exercise, EF 60%, ST depression in recovery and mild chest pain, no  ischemia or infarction by perfusion images.  Unstable angina 11/15 with 80% LM, patent SVG-RCA => DES to ostial LM.   - Lexiscan Cardiolite (2/18): EF 62%, no ischemia or infarction (normal).  She had a significant reaction to [place] and probably should not get again.  - Cardiolite (9/18): EF 72%, prior inferior infarct with mild peri-infarct ischemia.  - LHC (9/18): 2 layers stent left main - ostial LAD with 50% dLM in-stent restenosis, 75+% ostial LCx, totally occluded RCA, SVG-RCA patent.  Medical management planned.  - ETT-Cardiolite (4/19): EF normal, 7'11" exercise, no ischemia or infarction.  - Lexiscan Cardiolite (8/20): No ischemia/infarction.  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  17. Anemia: Iron-deficiency.  Suspect chronic GI blood loss.  EGD in 9/14 was unremarkable.  Capsule endoscopy 10/14 was unremarkable.  Has required periodic blood transfusions.  18. Atrial fibrillation: Paroxysmal, noted by [facility] interrogation.  - Zio monitor 3/23: < 1% atrial fibrillation, longest run was 18 minutes.  19. Carotid stenosis: Carotid dopplers (2/15) with 40-59% bilateral stenosis. Carotid dopplers (3/16) with 40-59% bilateral ICA stenosis. Carotid dopplers (3/17) with 40-59% bilateral ICA stenosis.  - Carotid  dopplers (3/18) with mild BICA stenosis.  20. 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  22. C difficile colitis 2017 23. Gout 24. ?TIA: head CT negative.  25. OSA: Moderate, uses CPAP.  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 Hypercalcemia 28. 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  Mild-moderate on 9/21 echo.  Mild on 2/23 echo.  29. Tricuspid regurgitation: Moderate by 8/20 echo.  30. Lung nodules: Followed at [facility] => stable.  31. Essential tremor  Current Outpatient Medications  Medication Sig Dispense Refill   acyclovir ointment (ZOVIRAX) 5 % Apply 1 application topically every 3 (three) hours as needed (fever blister/cold sores).     ALPRAZolam (XANAX) 0.5 MG tablet Take 1 tablet (0.5 mg total) by mouth 2 (two) times daily as needed for anxiety. 60 tablet 1   APIDRA 100 UNIT/ML injection Inject 40 Units into the skin as directed. INFUSE THROUGH INSULIN PUMP UTD, Bolus depends on carb intake  2   aspirin EC 81 MG tablet Take 81 mg by mouth at bedtime.     BRILINTA 60 MG TABS tablet TAKE ONE TABLET (60MG  TOTAL) BY MOUTH TWICE DAILY 180 tablet 3   cetirizine (ZYRTEC) 10 MG tablet Take 10 mg by mouth in the morning.     clobetasol (OLUX) 0.05 % topical foam Apply 1 application  topically 2 (two) times daily as needed (skin flaking (legs)).     Coenzyme Q10 (CO Q 10) 100 MG CAPS Take 100 mg by mouth at bedtime.      colchicine 0.6 MG tablet Take 0.6 mg by mouth daily as needed (gout flare).     denosumab (PROLIA) 60 MG/ML SOSY injection Inject 60 mg into the skin every 6 (six) months.     diclofenac Sodium (VOLTAREN) 1 % GEL Apply 2 g topically 4 (four) times daily as needed (pain.).     escitalopram (LEXAPRO) 5 MG tablet Take 5 mg by mouth at bedtime.     famotidine (PEPCID) 40 MG tablet Take 40 mg by mouth at bedtime.     gabapentin (NEURONTIN)  100 MG capsule Take 100 mg by mouth daily.     glucagon (GLUCAGON EMERGENCY) 1 MG injection Inject 1 mg into the vein  once as needed (low blood sugar).     latanoprost (XALATAN) 0.005 % ophthalmic solution Place 1 drop into both eyes at bedtime.     metolazone (ZAROXOLYN) 2.5 MG tablet Take 2.5 mg (1 tab) once as directed by CHF clinic. Call before taking [phone] 3 tablet 0   midodrine (PROAMATINE) 5 MG tablet Take 1 tablet (5 mg total) by mouth daily as needed (Prior to HD). 30 tablet 3   mometasone (NASONEX) 50 MCG/ACT nasal spray Place 1 spray into the nose daily as needed (allergies).     mupirocin cream (BACTROBAN) 2 % Apply 1 application topically 2 (two) times daily as needed. 90 g 0   nitroGLYCERIN (NITROLINGUAL) 0.4 MG/SPRAY spray PLACE 1 SPRAY UNDER TH TONGUE EVERY 5 MINUTES FOR 3 DOSES AS NEEDED FOR CHEST PAIN 4.9 g 5   polyethylene glycol (MIRALAX / GLYCOLAX) 17 g packet Take 17 g by mouth in the morning. Hold for loose stools     pravastatin (PRAVACHOL) 10 MG tablet TAKE ONE TABLET (10 MG TOTAL) BY MOUTH DAILY. 90 tablet 3   Probiotic Product (ALIGN PO) Take 1 tablet by mouth daily in the afternoon.     REPATHA SURECLICK 140 MG/ML SOAJ INJECT 140MG  INTO THE SKIN EVERY 14 DAYS 6 mL 3   ULORIC 40 MG tablet Take 1 tablet (40 mg total) by mouth at bedtime. 90 tablet 3   VASCEPA 1 g capsule TAKE TWO CAPSULES (2 GRAMS TOTAL) BY MOUTH TWO TIMES DAILY 360 capsule 3   isosorbide mononitrate (IMDUR) 120 MG 24 hr tablet Take 1 tablet (120 mg total) by mouth every morning.     oxyCODONE (ROXICODONE) 5 MG immediate release tablet Take 1 tablet (5 mg total) by mouth every 6 (six) hours as needed for severe pain. (Patient not taking: Reported on 05/08/2023) 7 tablet 0   torsemide (DEMADEX) 20 MG tablet Take 40 mg Twice daily on Wed,sat and sun 64 tablet 3   No current facility-administered medications for this encounter.    Allergies:   Avandia [rosiglitazone maleate], Cephalexin,  Clindamycin, Humalog [insulin lispro], Lincomycin, Metformin, Novolog [insulin aspart], Penicillins, Tizanidine, Adhesive [tape], Atorvastatin, Cholestyramine, Gentamycin [gentamicin], Iodinated contrast media, Limonene, Prednisone, Ranexa [ranolazine], Rosuvastatin, Simvastatin, Acetaminophen, Azelastine, Clopidogrel, Kerendia [finerenone], Lisinopril, Neosporin [bacitracin-polymyxin b], Codeine, Erythromycin, Hydrocodone-acetaminophen, Latex, Neomycin, Nickel, Ranitidine, Tamiflu [oseltamivir], and Tramadol   Social History:  The patient  reports that she has never smoked. She has never used smokeless tobacco. She reports that she does not drink alcohol and does not use drugs.   Family History:  The patient's family history includes Anal fissures in her mother; Breast cancer (age of onset: 33) in her sister; Cancer (age of onset: 26) in her sister; Cancer (age of onset: 89) in her maternal grandfather; Diabetes in her father; Glaucoma in her brother; Heart attack in her father, maternal grandmother, and mother; Heart disease in her father and mother; Hypertension in her brother; Lung cancer in her sister; Parkinson's disease in her paternal aunt.   ROS:  Please see the history of present illness.   All other systems are personally reviewed and negative.   Exam:   BP (!) 110/50   Pulse 92   Wt 54.8 kg (120 lb 12.8 oz)   SpO2 97%   BMI 22.09 kg/m   PHYSICAL EXAM: General: NAD Neck: JVP 12 cm, no thyromegaly or thyroid nodule.  Lungs: Clear to auscultation bilaterally with normal respiratory effort. CV: Nondisplaced PMI.  Heart regular S1/S2, no S3/S4, 2/6  SEM RUSB.  No peripheral edema.  No carotid bruit.  Normal pedal pulses.  Abdomen: Soft, nontender, no hepatosplenomegaly, mild distention.  Skin: Intact without lesions or rashes.  Neurologic: Alert and oriented x 3.  Psych: Normal affect. Extremities: No clubbing or cyanosis.  HEENT: Normal.   Wt Readings from Last 3 Encounters:   05/08/23 54.8 kg (120 lb 12.8 oz)  05/01/23 52.2 kg (115 lb)  04/18/23 53.1 kg (117 lb)   ASSESSMENT AND PLAN: 1. Chronic diastolic CHF: Restrictive cardiomyopathy.  Most recent echo in 2/23 shows normal LV systolic function, normal RV systolic function but D-shaped septum suggesting RV pressure/volume overload, mild MS, normal bioprosthetic AoV, no evidence for pericardial constriction. RHC in 8/20 was concerning for right > left heart failure, she did appear to have equalization of diastolic pressures concerning for restrictive cardiomyopathy, possibly related to prior radiation. CPX in 11/20 showed severe HF limitation.  She was evaluated at [facility] for heart/kidney transplant for CKD and restrictive cardiomyopathy, but extensive vascular calcification makes her a poor surgical candidate. Balance of renal failure and RV failure has been difficult. RHC 1/24 showed elevated right heart filling pressures out of proportion to PCWP suggesting primarily RV failure. Mild PAH. Restrictive filling picture. Low cardiac index at 1.71 (RA 13, PA 43/15, mean 25, PCWP 12 with v waves to 20, CO 2.54, CI 1.71). Echo done at [facility] 12/23 showed LVEF 55%, AoV bioprosthesis functioning normal. RV systolic function reported to be normal. Moderate TR noted, no mention of mitral stenosis. Worsening NYHA class IIIb symptoms.  She has started HD, but they have not been pulling much fluid due to lightheadedness/hypotension with HD.  She is more volume overloaded on exam today and weight is up.  - Increase torsemide to 40 mg bid on non-HD days. She still makes urine.  - I will start her on midodrine 5 mg pre-HD to try to maintain MAP and allow more fluid removal.  She needs more fluid off with HD.  - I will arrange for echo at followup appt.  2. CAD: s/p SVG-RCA in 12/05, then redo SVG-RCA in 1/11--> distal left main/proximal LAD severe stenosis in 9/14.  PCI with DES to distal LM/proximal LAD was done rather than bypass as she  would have been a poor candidate for redo sternotomy (2 prior sternotomies and history of chest wall radiation for Hodgkins disease).  Recurrent unstable angina with 80% ostial LM treated with repeat DES in 11/15.  Lexiscan Cardiolite in 9/18 inferior changes.  Repeat LHC (9/18) showed 50% distal left main in-stent restenosis and 75+% ostial LCx stenosis. I reviewed the films at the time with interventional cardiology => would be very difficult to intervene on the ostial LCx through 2 overlapping stents from left main into the LAD.  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 Given no ACS, we planned medical management and increased Imdur.  Cardiolite in 3/19 and again in 8/20 showed no ischemia or infarction.  She is noting a chronic dull chest ache that starts after HD. She has been using NTG. ?Angina in setting of low BP post-HD.   - Midodrine as above to keep BP up with HD.  - I will have her take her Imdur in the morning rather than at bedtime.  - Continue Brilinta 60 mg bid (poor responder to Plavix) long-term given recurrent left main disease.  - Continue ASA 81, pravastin/Repatha/Vascepa, good lipids in 6/24.  - Long 1st degree AVB, stopped Toprol  XL.  - She was unable to tolerate ranolazine.  - I will arrange for cardiac PET to assess for ischemia.  3. Bioprosthetic aortic valve replacement: In setting of bicuspid aortic valve, thoracic aorta not dilated on 2017 TEE.  Valve looked ok on 2/23 echo.  Stable on Echo done at [facility] 12/23. - I will arrange for repeat echo.  4. ESRD:  Patient has a single kidney s/p right nephrectomy and suspected diabetic nephropathy.  She has progressed to ESRD and is getting iHD.   - As above, will add midodrine to try to allow more fluid removal.  - Not candidate for PD given prior abdominal surgery.  - Would like to get her to home HD.    5. Hyperlipidemia: She has not been able to tolerate any higher potency statin than pravastatin  10 mg daily.   - Continue pravastatin.  - Continue Repatha.   - She is on Vascepa for elevated TGs.  6. Anemia: Chronic/slow GI bleed.  Unremarkable EGD and capsule endoscopy in fall 2014.  Unfortunately, needs to stay on Brilinta long-term (Plavix non-responder).   7. Atrial fibrillation: Paroxysmal, very brief episodes noted on PCM interrogation. Given GI bleeding and need to be on Brilinta, risks have been thought to outweigh benefits of anticoagulation. She is not a good Multaq candidate with CHF.  If she needs an antiarrhythmic, consider Tikosyn or Sotalol but renal function may make these meds difficult to use.  Zio monitor in 3/23 showed < 1% AF, longest run 18 minutes. Atrial fibrillation noted recently with HD, but episodes still appear to be very short.  - I will arrange for Zio monitor x 2 wks to quantify AF burden.  - We were unable to place Watchman device due to nickel allergy.   8. Carotid bruit: Only mild stenosis on last dopplers in 3/18.  9. SSS: Pacemaker in place.  10. HTN: BP controlled.    11. OSA: Continue ASV.  12. ?TIA: Transient dysarthria in 3/18, could have been afib-related TIA.  No recurrent symptoms.  13. Thrombocytopenia: Chronic and mild. Follows with hematology.  14. 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  Mild on 2/23 echo.  She would not be a candidate for valve replacement.  - Repeat echo as above.  15. Hematology: MGUS noted, has seen hematology.  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  EGD 07/26/22 negative for varices  Followup with APP in 1 month.   Signed, Marca Ancona, MD  05/08/2023  [facility] [address] [facility] [place] [address] [phone] (office) [phone] (fax)

## 2023-05-09 DIAGNOSIS — I5030 Unspecified diastolic (congestive) heart failure: Secondary | ICD-10-CM | POA: Diagnosis not present

## 2023-05-09 DIAGNOSIS — Z992 Dependence on renal dialysis: Secondary | ICD-10-CM | POA: Diagnosis not present

## 2023-05-09 DIAGNOSIS — N186 End stage renal disease: Secondary | ICD-10-CM | POA: Diagnosis not present

## 2023-05-10 DIAGNOSIS — N186 End stage renal disease: Secondary | ICD-10-CM | POA: Diagnosis not present

## 2023-05-10 DIAGNOSIS — Z992 Dependence on renal dialysis: Secondary | ICD-10-CM | POA: Diagnosis not present

## 2023-05-10 DIAGNOSIS — I5030 Unspecified diastolic (congestive) heart failure: Secondary | ICD-10-CM | POA: Diagnosis not present

## 2023-05-11 ENCOUNTER — Encounter (HOSPITAL_COMMUNITY): Payer: Self-pay | Admitting: Cardiology

## 2023-05-13 DIAGNOSIS — N186 End stage renal disease: Secondary | ICD-10-CM | POA: Diagnosis not present

## 2023-05-13 DIAGNOSIS — I5030 Unspecified diastolic (congestive) heart failure: Secondary | ICD-10-CM | POA: Diagnosis not present

## 2023-05-13 DIAGNOSIS — Z992 Dependence on renal dialysis: Secondary | ICD-10-CM | POA: Diagnosis not present

## 2023-05-14 DIAGNOSIS — I5030 Unspecified diastolic (congestive) heart failure: Secondary | ICD-10-CM | POA: Diagnosis not present

## 2023-05-14 DIAGNOSIS — N186 End stage renal disease: Secondary | ICD-10-CM | POA: Diagnosis not present

## 2023-05-14 DIAGNOSIS — Z992 Dependence on renal dialysis: Secondary | ICD-10-CM | POA: Diagnosis not present

## 2023-05-15 ENCOUNTER — Telehealth (HOSPITAL_COMMUNITY): Payer: Self-pay | Admitting: *Deleted

## 2023-05-15 NOTE — Telephone Encounter (Signed)
Reaching out to patient to offer assistance regarding upcoming cardiac imaging study; pt verbalizes understanding of appt date/time, parking situation and where to check in, pre-test NPO status and medications ordered, and verified current allergies; name and call back number provided for further questions should they arise Johney Frame RN Navigator Cardiac Imaging [facility] [phone] office [phone] cell  Patient aware to hold imdur, diabetes meds (if fasting), and avoid caffeine for 12 hours prior to test.

## 2023-05-16 ENCOUNTER — Ambulatory Visit
Admission: RE | Admit: 2023-05-16 | Discharge: 2023-05-16 | Disposition: A | Discharge status: Home / Self Care | Payer: Medicare PPO | Source: Ambulatory Visit | Attending: Cardiology | Admitting: Cardiology

## 2023-05-16 DIAGNOSIS — J90 Pleural effusion, not elsewhere classified: Secondary | ICD-10-CM | POA: Diagnosis not present

## 2023-05-16 DIAGNOSIS — I5030 Unspecified diastolic (congestive) heart failure: Secondary | ICD-10-CM | POA: Diagnosis not present

## 2023-05-16 DIAGNOSIS — Z992 Dependence on renal dialysis: Secondary | ICD-10-CM | POA: Diagnosis not present

## 2023-05-16 DIAGNOSIS — I25110 Atherosclerotic heart disease of native coronary artery with unstable angina pectoris: Secondary | ICD-10-CM | POA: Diagnosis not present

## 2023-05-16 DIAGNOSIS — I200 Unstable angina: Secondary | ICD-10-CM

## 2023-05-16 DIAGNOSIS — N186 End stage renal disease: Secondary | ICD-10-CM | POA: Diagnosis not present

## 2023-05-16 LAB — NM PET CT CARDIAC PERFUSION MULTI W/ABSOLUTE BLOODFLOW
MBFR: 1.08
Nuc Rest EF: 68 %
Nuc Stress EF: 55 %
Peak HR: 101 {beats}/min
Rest HR: 88 {beats}/min
Rest MBF: 1.48 ml/g/min
Rest Nuclear Isotope Dose: 14.1 mCi
SRS: 3
SSS: 2
Stress MBF: 1.6 ml/g/min
Stress Nuclear Isotope Dose: 14.1 mCi
TID: 1.25

## 2023-05-16 MED ORDER — REGADENOSON 0.4 MG/5ML IV SOLN
0.4000 mg | Freq: Once | INTRAVENOUS | Status: AC
  Administered 2023-05-16: 0.4 mg via INTRAVENOUS
  Filled 2023-05-16: qty 5

## 2023-05-16 MED ORDER — RUBIDIUM RB82 GENERATOR (RUBYFILL)
25.0000 | PACK | Freq: Once | INTRAVENOUS | Status: AC
  Administered 2023-05-16: 14.1 via INTRAVENOUS

## 2023-05-16 MED ORDER — RUBIDIUM RB82 GENERATOR (RUBYFILL)
25.0000 | PACK | Freq: Once | INTRAVENOUS | Status: AC
  Administered 2023-05-16: 14.13 via INTRAVENOUS

## 2023-05-16 MED ORDER — REGADENOSON 0.4 MG/5ML IV SOLN
INTRAVENOUS | Status: AC
  Filled 2023-05-16: qty 5

## 2023-05-16 NOTE — Progress Notes (Signed)
Patient presents for a cardiac PET stress test and tolerated procedure without incident. Patient reported chest pain, nausea, and headache during test, resolved with PO caffeine. Patient maintained acceptable vital signs throughout the test  Patient ambulated with a steady gait, assisted out of the department in a wheelchair.

## 2023-05-17 DIAGNOSIS — Z992 Dependence on renal dialysis: Secondary | ICD-10-CM | POA: Diagnosis not present

## 2023-05-17 DIAGNOSIS — I5030 Unspecified diastolic (congestive) heart failure: Secondary | ICD-10-CM | POA: Diagnosis not present

## 2023-05-17 DIAGNOSIS — N186 End stage renal disease: Secondary | ICD-10-CM | POA: Diagnosis not present

## 2023-05-19 ENCOUNTER — Telehealth: Payer: Self-pay | Admitting: Student

## 2023-05-19 NOTE — Telephone Encounter (Signed)
    The patient called the after hours line reporting her HR has been elevated since her stress test on 05/16/2023. HR has been in the 70's to low-100's but mostly in the 90's. She reports this is above her usual baseline. We reviewed this is unlikely to be due to Lexiscan due to it's short half-life. In reviewing her chart, she was previously on Toprol-XL but this was stopped in 03/2023 due to prolonged PR interval. HR was in the 90's at the time of her office visit earlier this month which is similar to what she is reporting today. We reviewed that her resting HR is likely higher due to no longer being on Toprol-XL and would not resume at this time given her prolonged PR interval. Was also started on HD on 05/02/2023 and has been requiring Midodrine which would limit the use of medications that can impact her BP. She also just mailed back her Zio patch which will allow for further evaluation once this has been resulted. She voiced understanding of this and was appreciative of the return call. Will route to Dr. Shirlee Latch as an [place] Childes.   Signed, Ellsworth Lennox, PA-C 05/19/2023, 2:28 PM Pager: [phone]

## 2023-05-19 NOTE — Telephone Encounter (Signed)
Please see if she is able to see me in the office at [facility] this week (usually sees me in [place]).  Need to go over her abnormal PET scan.  She has just started HD.  I think we could plan to proceed with RHC/LHC but would like to discuss with her and also get ECG given elevated HR (make sure not in atrial fibrillation).

## 2023-05-20 DIAGNOSIS — Z992 Dependence on renal dialysis: Secondary | ICD-10-CM | POA: Diagnosis not present

## 2023-05-20 DIAGNOSIS — I5030 Unspecified diastolic (congestive) heart failure: Secondary | ICD-10-CM | POA: Diagnosis not present

## 2023-05-20 DIAGNOSIS — N186 End stage renal disease: Secondary | ICD-10-CM | POA: Diagnosis not present

## 2023-05-20 NOTE — Telephone Encounter (Signed)
Pt aware Add on 10/30 245

## 2023-05-21 ENCOUNTER — Encounter: Payer: Self-pay | Admitting: Cardiology

## 2023-05-21 DIAGNOSIS — N186 End stage renal disease: Secondary | ICD-10-CM | POA: Diagnosis not present

## 2023-05-21 DIAGNOSIS — Z992 Dependence on renal dialysis: Secondary | ICD-10-CM | POA: Diagnosis not present

## 2023-05-21 DIAGNOSIS — I5030 Unspecified diastolic (congestive) heart failure: Secondary | ICD-10-CM | POA: Diagnosis not present

## 2023-05-22 ENCOUNTER — Ambulatory Visit: Payer: Medicare PPO | Attending: Cardiology | Admitting: Cardiology

## 2023-05-22 VITALS — HR 94 | Wt 118.0 lb

## 2023-05-22 DIAGNOSIS — I4891 Unspecified atrial fibrillation: Secondary | ICD-10-CM

## 2023-05-22 DIAGNOSIS — I5022 Chronic systolic (congestive) heart failure: Secondary | ICD-10-CM | POA: Diagnosis not present

## 2023-05-22 MED ORDER — METOPROLOL SUCCINATE ER 25 MG PO TB24: 12.5000 mg | ORAL_TABLET | Freq: Every day | ORAL | 3 refills | Status: DC

## 2023-05-22 NOTE — Patient Instructions (Signed)
Medication Changes:  Take Toprol XL 12.5 mg (0.5 tablet) daily.    Testing/Procedures:  Your physician has requested that you have an echocardiogram. Echocardiography is a painless test that uses sound waves to create images of your heart. It provides your doctor with information about the size and shape of your heart and how well your heart's chambers and valves are working. This procedure takes approximately one hour. There are no restrictions for this procedure. Please do NOT wear cologne, perfume, aftershave, or lotions (deodorant is allowed). Please arrive 15 minutes prior to your appointment time.   Please call [phone] if you need to reschedule.     Special Instructions // Education:  Do the following things EVERYDAY: Weigh yourself in the morning before breakfast. Write it down and keep it in a log. Take your medicines as prescribed Eat low salt foods--Limit salt (sodium) to 2000 mg per day.  Stay as active as you can everyday Limit all fluids for the day to less than 2 liters   Follow-Up in: follow up in 1 month in our [facility] location. please call [phone]    If you have any questions or concerns before your next appointment please send [name] a message through mychart or call our office at [phone] Monday-Friday 8 am-5 pm.   If you have an urgent need after hours on the weekend please call your Primary Cardiologist or the [facility] in [place] at [phone].   At the [facility], you and your health needs are our priority. We have a designated team specialized in the treatment of Heart Failure. This Care Team includes your primary Heart Failure Specialized Cardiologist (physician), Advanced Practice Providers (APPs- Physician Assistants and Nurse Practitioners), and Pharmacist who all work together to provide you with the care you need, when you need it.   You may see any of the following providers on  your designated Care Team at your next follow up:  Dr. Arvilla Meres Dr. Marca Ancona Dr. Dorthula Nettles Dr. Theresia Bough Tonye Becket, NP Robbie Lis, [place] [address] [place], [place] Brynda Peon, NP [place] Lee, NP Clarisa Kindred, NP Enos Fling, PharmD

## 2023-05-23 DIAGNOSIS — Z992 Dependence on renal dialysis: Secondary | ICD-10-CM | POA: Diagnosis not present

## 2023-05-23 DIAGNOSIS — I5030 Unspecified diastolic (congestive) heart failure: Secondary | ICD-10-CM | POA: Diagnosis not present

## 2023-05-23 DIAGNOSIS — I509 Heart failure, unspecified: Secondary | ICD-10-CM | POA: Diagnosis not present

## 2023-05-23 DIAGNOSIS — N186 End stage renal disease: Secondary | ICD-10-CM | POA: Diagnosis not present

## 2023-05-23 NOTE — Progress Notes (Signed)
Date:  05/23/2023   ID:  [name], [name] Jan 28, 1951, MRN [id number]   Provider location: [address], [place] Type of Visit: Established patient  PCP:  Aliene Beams, MD  Cardiologist:  None Primary HF: Dr. Shirlee Latch   History of Present Illness: [name] [name] is a 72 y.o. female who has a history of CAD s/p CABG in 12/05 and redo in 1/11, bioprosthetic AVR, diastolic CHF, and sick sinus syndrome s/p PPM presents for cardiology followup.  She had initial CABG with SVG-RCA in 12/05 followed by redo SVG-RCA and bioprosthetic AVR in 1/11.  She has had diastolic CHF and is on Lasix.  In 9/14, she had a CPX test.  This showed severe functional limitation with ischemic ECG changes (inferolateral ST depression and ST elevation in V1 and V2). She had chest pain.  She had R/LHC in 9/14.  This showed elevated left and right heart filling pressures, patent SVG-PDA, and 80% distal LM/80% ostial LAD stenosis.  She was not a candidate for redo CABG (had 2 prior sternotomies as well as chest radiation for Hodgkins lymphoma).  She had DES from left main into proximal LAD and will need long-term DAPT => Brilinta was used as she has been a poor responder to Plavix in the past.  She re-developed angina in 11/15 and was admitted with unstable angina.  LHC showed 80% ostial LM and patent SVG-RCA. She had DES to ostial LM. Echo 1/18 showed EF 60-65%, normal bioprosthetic aortic valve, mild-moderate MR, PASP 35 mmHg.    She has chronic iron deficiency anemia thought to be due to a slow GI bleed.  Last EGD was unremarkable and a capsule endoscopy was also unremarkable.  She has had periodic transfusions.     She had bilateral mastectomy for DCIS in 10/16 without complication.   She has an insulin pump followed by an endocrinologist at [facility].    Given myalgias with statins, she was started on Praluent.  LDL is now excellent.     In the past she has had short runs of atrial fibrillation.  She is  not anticoagulated. Planned for Watchman placement, but given her nickel allergy, we were unable to place Watchman.    Diagnosed with OSA, now on CPAP.    Given ongoing problems with exertional dyspnea and occasional chest pain, Cardiolite was repeated in 9/18 and showed inferior changes.  She underwent left/right heart cath in 9/18.  This showed elevated filling pressures, R>L.  There was 75+% ostial LCx stenosis.  However, there were 2 layers of stent from the mid left main into the ostial LAD, and LCx intervention was thought to be very risky.  It was decided to manage her medically.  Echo was repeated in 10/18 showing EF 55-60%, moderate to severe TR.     Given recurrent chest pain, she had ETT-Cardiolite done in 3/19.  This was a normal study with no evidence for ischemia or infarction.  Echo in 1/20 showed EF 55-60%, mild LVH, stable bioprosthetic aortic valve, moderate-severe TR.    Due to increased dyspnea and worsening renal function, she was set up for echo and RHC.  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  RHC in 8/20 showed primarily right-sided HF.  Cardiolite in 8/20 showed no ischemia or infarction.  Torsemide was increased and she cut back on sodium in her diet.    CPX in 11/20 showed severe functional limitation from HF.   She saw Dr. Edwena Blow at [facility] for evaluation for heart/kidney transplant (for restrictive cardiomyopathy).  She was determined to be a poor surgical candidate due to extensive vascular calcifications.   CT chest in 8/21 showed a loculated right pleural effusion but unable to find enough fluid by ultrasound for thoracentesis.   Echo in 9/21 showed EF 60-65% with mild LVH, mild RV dilation with mildly decreased systolic function and a  D-shaped septum, PASP 40, normal bioprosthetic aortic valve, rheumatic-appearing MV with mild-moderate mitral stenosis (mean gradient 9 but MVA by PHT 2.7 cm^2), trivial MR.    Echo 2/23, EF 60-65%, mild LVH, normal RV systolic function but with D-shaped septum, normal bioprosthetic aortic valve, mild mitral stenosis (rheumatic-appearing valve, mean gradient 4 mmHg), mild MR.   Seen by hematology 10/23. Labs showed mild hypercalcemia. Calcium level 10.4 with albumin 4.1. SPEP showed M spike.  Kappa/lambda light chain ratio 2.17. Immunofixation showed polyclonal gammopathy. Plan is to return in 6 months to repeat myeloma labs. She has not had a cMRI due to PPM.   Echo at [facility] in 12/23 showed LVEF 55%, AoV bioprosthesis functioning normal. RV systolic function reported to be normal. Moderate TR noted. No mention of mitral stenosis.   Patient started HD in 10/24. Atrial fibrillation noted during her first HD session, only lasted a few minutes.   Cardiac PET in 10/24 showed EF 68% rest/55% stress; small mid-apical inferolateral partially reversible defect (infarct with peri-infarct ischemia), decreased myocardial blood flow reserve, +TID. Considered high risk.   Today she returns for followup of CHF and CAD.  With the addition of midodrine, she seems to be doing better with HD.  BP is not dropping as much and she denies lightheadedness. Weight down 2 lbs. No chest pain with dialysis now, she still notes chest pain when climbing stairs and with heavier exertion.  She is taking less NTG, 2-3 times/week.  She feels like her heart is racing more now that she has been off Toprol XL. No dyspnea walking short distances.  No orthopnea/PND. Still with some abdominal swelling but better.   ECG (personally reviewed): NSR, long 1st degree, LVH   Labs @ [facility] (3/19): K 4.7, creatinine 1.4, ANA negative, RF negative Labs (3/20): K 4, creatinine 1.8 Labs (6/20): K 4.7, creatinine 1.47 Labs (8/20): creatinine 2.13 =>  2.46 => 2.4, LDL 22 Labs (9/20): K 4.8, creatinine 2.74, AST/ALT normal, hgb 12.6 Labs (1/21): K 4.1, creatinine 1.86, hgb 11.7 Labs (4/21): K 4.7, creatinine 1.7 Labs (6/21): K 4.2, creatinine 2 => 1.7 Labs (8/21): K 3.8, creatinine 1.69, hgb 10.6, plts 116 Labs (9/21): LDL 34, TGs 209 Labs (1/22): K 3.8, creatinine 2.0 Labs (3/22): LDL 25 Labs (4/22): K 4.7, creatinine 2.0, hgb 12.3 Labs (5/22): K 4.4, creatinine 2.21 Labs (8/22): LDL 1, TGs 330 Labs (10/22): K 4.6, creatinine 1.87 Labs (11/22): LDL 26 Labs (12/22): K 4.1, creatinine 2.25 Labs (1/23): creatinine 2.65 Labs (3/23): K 4.6, creatinine 2.8 Labs (4/23): K 4.1, creatinine 2.16, BNP 94 Labs (10/23): K 4.2, creatinine 1.62, calcium 10.4  Labs (12/23): K 4.1, creatinine 2.23 Labs (4/24): K 4.6, SCr 2.6, hgb 12.5 Labs (5/24): K 4.4, creatinine 2.85 Labs (6/24): LDL 24, BNP 118 Labs (9/24): K 4.7, creatinine 2.5   PMH: 1. Sick sinus syndrome s/p [employer] PCM.  2. HTN 3. H/o SBO 4.  Renal cell carcinoma s/p right nephrectomy in 2008.  5. 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  Fibroscan in 11/14 showed advanced fibrosis concerning for cirrhosis. EGD in 11/14 showed no varices.  6. Diastolic CHF: Echo (4/14) with EF 60-65%, bioprosthetic aortic valve with mean gradient 15 mmHg, mild MR.  CPX (9/14): peak VO2 10.4, VE/VCO2 43.8, inferolateral ST depression and ST elevation in V1/V2 with severe dyspnea and chest pain, severe functional limitation with ischemic changes.  Echo (9/14) with EF 60-65%, normal RV size and systolic function, mild-moderate MR, bioprosthetic aortic valve normal, PA systolic pressure 51 mmHg, no evidence for pericardial constriction though exam incomplete for constriction.  Echo (11/15) with EF 55-60%, bioprosthetic aortic valve, mild-moderate MR, moderate TR, PA systolic pressure 42 mmHg.  - TEE (5/17) with EF 60-65%, normal bioprosthetic aortic valve with mean gradient 13 mmHg, normal RV size and systolic  function. - Echo (1/18) with EF 60-65%, normal bioprosthetic aortic valve, mild-moderate MR, PASP 35 mmHg.  - Echo (10/18) with EF 55-60%, bioprosthetic aortic valve mean gradient 10 mmHg, MAC with mild mitral stenosis (mean gradient 7) and mild mitral regurgitation, moderate to severe TR, PASP 43 mmHg.  - RHC (9/18): mean RA 13, PA 44/18 mean 33, mean PCWP 19, CI 3.45, PVR 2.7 WU.  - Echo (1/20): EF 55-60%, mild LVH, mild MR, stable bioprosthetic aortic valve, moderate-severe TR.  - RHC (8/20): mean RA 14, PA 43/15 mean 28, mean PCWP 13, PAPI 2, PVR 4.2, CI 2.31 - Echo (8/20): EF 55-60%, RV mildly dilated with normal systolic function, mildly D-shaped septum suggestive of RV pressure/volume overload, rheumatic-appearing mitral valve with mild mitral stenosis (mean gradient 6, MVA by PHT 4 cm^2), no evidence for pericardial constriction, moderate TR, bioprosthetic aortic valve functioning normally with mean gradient 6 mmHg.  - CPX (11/20): peak VO2 9.8, VE/VCO2 slope 38, RER 1.11 => severe functional limitation from HF.   - Echo (9/21): EF 60-65% with mild LVH, mild RV dilation with mildly decreased systolic function and a D-shaped septum, PASP 40, normal bioprosthetic aortic valve, rheumatic-appearing MV with mild-moderate mitral stenosis (mean gradient 9 but MVA by PHT 2.7 cm^2, trivial MR.  - Echo (2/23): EF 60-65%, mild LVH, normal RV systolic function but with D-shaped septum, normal bioprosthetic aortic valve, mild mitral stenosis (rheumatic-appearing valve, mean gradient 4 mmHg), mild MR.  - RHC 1/24: Elevated right heart filling pressures out of proportion to PCWP suggesting primarily RV failure. Mild PAH. Restrictive filling picture. Low cardiac index at 1.71. (RA 13, PA 43/15, mean 25, PCWP 12 with v waves to 20, CO 2.54, CI 1.71) - Echo (12/23, [facility]): LVEF 55%, AoV bioprosthesis functioning normal. RV systolic function reported to be normal. Moderate TR noted. No mention of mitral stenosis.   7. TAH-BSO 8. Type I diabetes: Has insulin pump.  9. H/o ischemic colitis. 10. H/o PUD. 11. Diabetic gastroparesis. 12. Hyperlipidemia: Myalgias with Crestor and atorvastatin, elevated LFTs with simvastatin.  Myalgias with > 10 mg pravastatin. Now on Praluent.  13. ESRD 14. Bicuspid aortic valve s/p 19 mm Edwards pericardial valve in 1/11 (had post-operative Dresslers syndrome).   15. CAD: CABG with SVG-RCA in 12/05.  Redo SVG-RCA in 1/11 with AVR.  LHC/RHC (9/14) with mean RA 18, PA 62/25 mean 43, mean PCWP 26, CI 2.8, 70-80% distal LM stenosis, 80% ostial LAD stenosis, total occlusion RCA, SVG-PDA patent.  Patient had DES LM into LAD.  Plan to continue long-term ASA/Brilinta (poor responder to Plavix).  ETT-cardiolite (4/15) with  6' exercise, EF 60%, ST depression in recovery and mild chest pain, no ischemia or infarction by perfusion images.  Unstable angina 11/15 with 80% LM, patent SVG-RCA => DES to ostial LM.   - Lexiscan Cardiolite (2/18): EF 62%, no ischemia or infarction (normal).  She had a significant reaction to [place] and probably should not get again.  - Cardiolite (9/18): EF 72%, prior inferior infarct with mild peri-infarct ischemia.  - LHC (9/18): 2 layers stent left main - ostial LAD with 50% dLM in-stent restenosis, 75+% ostial LCx, totally occluded RCA, SVG-RCA patent.  Medical management planned.  - ETT-Cardiolite (4/19): EF normal, 7'11" exercise, no ischemia or infarction.  - Lexiscan Cardiolite (8/20): No ischemia/infarction.  - Cardiac PET in 10/24 showed EF 68% rest/55% stress; small mid-apical inferolateral partially reversible defect (infarct with peri-infarct ischemia), decreased myocardial blood flow reserve, +TID. Considered high risk.  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  17. Anemia: Iron-deficiency.  Suspect chronic GI blood loss.  EGD in 9/14 was unremarkable.  Capsule endoscopy 10/14 was unremarkable.  Has required  periodic blood transfusions.  18. Atrial fibrillation: Paroxysmal, noted by [facility] interrogation.  - Zio monitor 3/23: < 1% atrial fibrillation, longest run was 18 minutes.  19. Carotid stenosis: Carotid dopplers (2/15) with 40-59% bilateral stenosis. Carotid dopplers (3/16) with 40-59% bilateral ICA stenosis. Carotid dopplers (3/17) with 40-59% bilateral ICA stenosis.  - Carotid dopplers (3/18) with mild BICA stenosis.  20. 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  22. C difficile colitis 2017 23. Gout 24. ?TIA: head CT negative.  25. OSA: Moderate, uses CPAP.  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 Hypercalcemia 28. 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  Mild-moderate on 9/21 echo.  Mild on 2/23 echo.  29. Tricuspid regurgitation: Moderate by 8/20 echo.  30. Lung nodules: Followed at [facility] => stable.  31. Essential tremor  Current Outpatient Medications  Medication Sig Dispense Refill   acyclovir ointment (ZOVIRAX) 5 % Apply 1 application topically every 3 (three) hours as needed (fever blister/cold sores).     ALPRAZolam (XANAX) 0.5 MG tablet Take 1 tablet (0.5 mg total) by mouth 2 (two) times daily as needed for anxiety. 60 tablet 1   APIDRA 100 UNIT/ML injection Inject 40 Units into the skin as directed. INFUSE THROUGH INSULIN PUMP UTD, Bolus depends on carb intake  2   aspirin EC 81 MG tablet Take 81 mg by mouth at bedtime.     BRILINTA 60 MG TABS tablet TAKE ONE TABLET (60MG  TOTAL) BY MOUTH TWICE DAILY 180 tablet 3   cetirizine (ZYRTEC) 10 MG tablet Take 10 mg by mouth in the morning.     clobetasol (OLUX) 0.05 % topical foam Apply 1 application  topically 2 (two) times daily as needed (skin flaking (legs)).     Coenzyme Q10 (CO Q 10) 100 MG CAPS Take 100 mg by mouth at bedtime.      colchicine 0.6 MG tablet Take 0.6 mg by mouth daily as  needed (gout flare).     denosumab (PROLIA) 60 MG/ML SOSY injection Inject 60 mg into the skin every 6 (six) months.     diclofenac Sodium (VOLTAREN) 1 % GEL Apply 2 g topically 4 (four) times daily as needed (pain.).     escitalopram (LEXAPRO) 5 MG tablet Take 5 mg by mouth at bedtime.  famotidine (PEPCID) 40 MG tablet Take 40 mg by mouth at bedtime.     gabapentin (NEURONTIN) 100 MG capsule Take 100 mg by mouth daily.     glucagon (GLUCAGON EMERGENCY) 1 MG injection Inject 1 mg into the vein once as needed (low blood sugar).     isosorbide mononitrate (IMDUR) 120 MG 24 hr tablet Take 1 tablet (120 mg total) by mouth every morning.     latanoprost (XALATAN) 0.005 % ophthalmic solution Place 1 drop into both eyes at bedtime.     metolazone (ZAROXOLYN) 2.5 MG tablet Take 2.5 mg (1 tab) once as directed by CHF clinic. Call before taking [phone] 3 tablet 0   metoprolol succinate (TOPROL XL) 25 MG 24 hr tablet Take 0.5 tablets (12.5 mg total) by mouth daily. 45 tablet 3   midodrine (PROAMATINE) 5 MG tablet Take 1 tablet (5 mg total) by mouth daily as needed (Prior to HD). 30 tablet 3   mometasone (NASONEX) 50 MCG/ACT nasal spray Place 1 spray into the nose daily as needed (allergies).     mupirocin cream (BACTROBAN) 2 % Apply 1 application topically 2 (two) times daily as needed. 90 g 0   nitroGLYCERIN (NITROLINGUAL) 0.4 MG/SPRAY spray PLACE 1 SPRAY UNDER TH TONGUE EVERY 5 MINUTES FOR 3 DOSES AS NEEDED FOR CHEST PAIN 4.9 g 5   oxyCODONE (ROXICODONE) 5 MG immediate release tablet Take 1 tablet (5 mg total) by mouth every 6 (six) hours as needed for severe pain. 7 tablet 0   polyethylene glycol (MIRALAX / GLYCOLAX) 17 g packet Take 17 g by mouth in the morning. Hold for loose stools     pravastatin (PRAVACHOL) 10 MG tablet TAKE ONE TABLET (10 MG TOTAL) BY MOUTH DAILY. 90 tablet 3   Probiotic Product (ALIGN PO) Take 1 tablet by mouth daily in the afternoon.     REPATHA SURECLICK 140 MG/ML SOAJ  INJECT 140MG  INTO THE SKIN EVERY 14 DAYS 6 mL 3   torsemide (DEMADEX) 20 MG tablet Take 40 mg Twice daily on Wed,sat and sun 64 tablet 3   ULORIC 40 MG tablet Take 1 tablet (40 mg total) by mouth at bedtime. 90 tablet 3   VASCEPA 1 g capsule TAKE TWO CAPSULES (2 GRAMS TOTAL) BY MOUTH TWO TIMES DAILY 360 capsule 3   No current facility-administered medications for this visit.    Allergies:   Avandia [rosiglitazone maleate], Cephalexin, Clindamycin, Humalog [insulin lispro], Lincomycin, Metformin, Novolog [insulin aspart], Penicillins, Tizanidine, Adhesive [tape], Atorvastatin, Cholestyramine, Gentamycin [gentamicin], Iodinated contrast media, Limonene, Prednisone, Ranexa [ranolazine], Rosuvastatin, Simvastatin, Acetaminophen, Azelastine, Clopidogrel, Kerendia [finerenone], Lisinopril, Neosporin [bacitracin-polymyxin b], Codeine, Erythromycin, Hydrocodone-acetaminophen, Latex, Neomycin, Nickel, Ranitidine, Tamiflu [oseltamivir], and Tramadol   Social History:  The patient  reports that she has never smoked. She has never used smokeless tobacco. She reports that she does not drink alcohol and does not use drugs.   Family History:  The patient's family history includes Anal fissures in her mother; Breast cancer (age of onset: 62) in her sister; Cancer (age of onset: 52) in her sister; Cancer (age of onset: 65) in her maternal grandfather; Diabetes in her father; Glaucoma in her brother; Heart attack in her father, maternal grandmother, and mother; Heart disease in her father and mother; Hypertension in her brother; Lung cancer in her sister; Parkinson's disease in her paternal aunt.   ROS:  Please see the history of present illness.   All other systems are personally reviewed and negative.   Exam:  Pulse 94   Wt 118 lb (53.5 kg)   SpO2 96%   BMI 21.58 kg/m   PHYSICAL EXAM: General: NAD Neck: JVP 8-9 cm, no thyromegaly or thyroid nodule.  Lungs: Clear to auscultation bilaterally with normal  respiratory effort. CV: Nondisplaced PMI.  Heart regular S1/S2, no S3/S4, 2/6 SEM RUSB.  No peripheral edema.  No carotid bruit.  Normal pedal pulses.  Abdomen: Soft, nontender, no hepatosplenomegaly, no distention.  Skin: Intact without lesions or rashes.  Neurologic: Alert and oriented x 3.  Psych: Normal affect. Extremities: No clubbing or cyanosis.  HEENT: Normal.   Wt Readings from Last 3 Encounters:  05/22/23 118 lb (53.5 kg)  05/08/23 120 lb 12.8 oz (54.8 kg)  05/01/23 115 lb (52.2 kg)   ASSESSMENT AND PLAN: 1. Chronic diastolic CHF: Restrictive cardiomyopathy.  Most recent echo in 2/23 shows normal LV systolic function, normal RV systolic function but D-shaped septum suggesting RV pressure/volume overload, mild MS, normal bioprosthetic AoV, no evidence for pericardial constriction. RHC in 8/20 was concerning for right > left heart failure, she did appear to have equalization of diastolic pressures concerning for restrictive cardiomyopathy, possibly related to prior radiation. CPX in 11/20 showed severe HF limitation.  She was evaluated at [facility] for heart/kidney transplant for CKD and restrictive cardiomyopathy, but extensive vascular calcification makes her a poor surgical candidate. Balance of renal failure and RV failure has been difficult. RHC 1/24 showed elevated right heart filling pressures out of proportion to PCWP suggesting primarily RV failure. Mild PAH. Restrictive filling picture. Low cardiac index at 1.71 (RA 13, PA 43/15, mean 25, PCWP 12 with v waves to 20, CO 2.54, CI 1.71). Echo done at [facility] 12/23 showed LVEF 55%, AoV bioprosthesis functioning normal. RV systolic function reported to be normal. Moderate TR noted, no mention of mitral stenosis. Initially, increased dyspnea after starting HD but this seems better now.  I suspect she is getting more ultrafiltration now that BP is running higher at HD with midodrine. She is still mildly volume overloaded.  - Continue torsemide 40  mg bid on non-HD days. She still makes urine.  - Continue midodrine 10 mg pre-HD to allow more ultrafiltration.  - She needs repeat echo.  2. CAD: s/p SVG-RCA in 12/05, then redo SVG-RCA in 1/11--> distal left main/proximal LAD severe stenosis in 9/14.  PCI with DES to distal LM/proximal LAD was done rather than bypass as she would have been a poor candidate for redo sternotomy (2 prior sternotomies and history of chest wall radiation for Hodgkins disease).  Recurrent unstable angina with 80% ostial LM treated with repeat DES in 11/15.  Lexiscan Cardiolite in 9/18 inferior changes.  Repeat LHC (9/18) showed 50% distal left main in-stent restenosis and 75+% ostial LCx stenosis. I reviewed the films at the time with interventional cardiology => would be very difficult to intervene on the ostial LCx through 2 overlapping stents from left main into the LAD.  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 Given no ACS, we planned medical management and increased Imdur.  Cardiolite in 3/19 and again in 8/20 showed no ischemia or infarction.  Cardiac PET in 10/24 showed EF 68% rest/55% stress; small mid-apical inferolateral partially reversible defect (infarct with peri-infarct ischemia), decreased myocardial blood flow reserve, +TID. Considered high risk.  She is now having less chest pain, notes with heavy exertion, 2-3 NTG/week.  This seems to be improving as she is doing better with HD and BP is  running higher on midodrine. The inferolateral partially reversible defect likely corresponds to the LCx ostial disease noted on last cath, no good option for intervention.   - Unless symptoms significantly worsen, would hold off on repeat cath as cPET suggests inferolateral/LCx territory ischemia and no good interventional option for known LCx ostial disease.  - Midodrine as above to keep BP up with HD.  - Continue Imdur 120 mg daily.  - Continue Brilinta 60 mg bid (poor responder to Plavix)  long-term given recurrent left main disease.  - Continue ASA 81, pravastin/Repatha/Vascepa, good lipids in 6/24.  - I will restart her on low dose Toprol XL 12.5 mg daily.  - She was unable to tolerate ranolazine.  3. Bioprosthetic aortic valve replacement: In setting of bicuspid aortic valve, thoracic aorta not dilated on 2017 TEE.  Valve looked ok on 2/23 echo.  Stable on Echo done at [facility] 12/23. - I will arrange for repeat echo.  4. ESRD:  Patient has a single kidney s/p right nephrectomy and suspected diabetic nephropathy.  She has progressed to ESRD and is getting iHD.   - As above, continue midodrine to try to allow more fluid removal.  - Not candidate for PD given prior abdominal surgery.  - Would like to get her to home HD.    5. Hyperlipidemia: She has not been able to tolerate any higher potency statin than pravastatin 10 mg daily.   - Continue pravastatin.  - Continue Repatha.   - She is on Vascepa for elevated TGs.  6. Anemia: Chronic/slow GI bleed.  Unremarkable EGD and capsule endoscopy in fall 2014.  Unfortunately, needs to stay on Brilinta long-term (Plavix non-responder).   7. Atrial fibrillation: Paroxysmal, very brief episodes noted on PCM interrogation. Given GI bleeding and need to be on Brilinta, risks have been thought to outweigh benefits of anticoagulation. She is not a good Multaq candidate with CHF.  If she needs an antiarrhythmic, consider Tikosyn or Sotalol but renal function may make these meds difficult to use.  Zio monitor in 3/23 showed < 1% AF, longest run 18 minutes. Atrial fibrillation noted recently with HD, but episodes still appear to be very short. She is in NSR today.  - Pending result of 2 week Zio monitor to quantify AF burden.  - We were unable to place Watchman device due to nickel allergy.   8. Carotid bruit: Only mild stenosis on last dopplers in 3/18.  9. SSS: Pacemaker in place.  10. HTN: BP controlled.    11. OSA: Continue ASV.  12. ?TIA:  Transient dysarthria in 3/18, could have been afib-related TIA.  No recurrent symptoms.  13. Thrombocytopenia: Chronic and mild. Follows with hematology.  14. 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  Mild on 2/23 echo.  She would not be a candidate for valve replacement.  - Repeat echo as above.  15. Hematology: MGUS noted, has seen hematology.  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  EGD 07/26/22 negative for varices  Followup in 1 month.   Signed, Marca Ancona, MD  05/23/2023  [facility] [facility] [address] [facility] [place] [address] [phone] (office) [phone] (fax)

## 2023-05-24 ENCOUNTER — Telehealth: Payer: Self-pay | Admitting: Cardiology

## 2023-05-24 DIAGNOSIS — Z992 Dependence on renal dialysis: Secondary | ICD-10-CM | POA: Diagnosis not present

## 2023-05-24 DIAGNOSIS — N186 End stage renal disease: Secondary | ICD-10-CM | POA: Diagnosis not present

## 2023-05-24 DIAGNOSIS — E1021 Type 1 diabetes mellitus with diabetic nephropathy: Secondary | ICD-10-CM | POA: Diagnosis not present

## 2023-05-24 DIAGNOSIS — I5030 Unspecified diastolic (congestive) heart failure: Secondary | ICD-10-CM | POA: Diagnosis not present

## 2023-05-24 NOTE — Telephone Encounter (Signed)
Patient with concerns regarding low O2 levels this afternoon. Complex, extensive cardiac hx following in the [facility] clinic with Dr. Shirlee Latch. Just seen in the office on 10/30. She started on HD 10/10, currently on a Monday, Tuesday, Thursday, Friday schedule. Says she has been having issues with lower BPs with HD, on midodrine as well. Had episode last Friday after HD, felt dizzy, faint, nauseated. This was felt to be 2/2 low BPs.   Today she was able to sit for her entire 3 1/2 hr HD session, but did have to stop mid way to take a midodrine (BP 105 systolic then). At home this afternoon she has noticed her O2 sats ranging from 85-91%. Got up from her recliner around 5pm felt faint, had ringing her in ears, was flushed. Sat down and noted her O2 sat 75%, has since recovered to 92%. I asked her to check BP while on the phone, 92/55. Advised symptoms are likely due to orthostatic hypotension. She does have compression stockings. We discussed using these, especially on HD days and making position changes slowly. Discussed warning signs to be seen in the ED for as well. She voiced understanding and thanked me for call back.

## 2023-05-27 DIAGNOSIS — I5030 Unspecified diastolic (congestive) heart failure: Secondary | ICD-10-CM | POA: Diagnosis not present

## 2023-05-27 DIAGNOSIS — N186 End stage renal disease: Secondary | ICD-10-CM | POA: Diagnosis not present

## 2023-05-27 DIAGNOSIS — Z992 Dependence on renal dialysis: Secondary | ICD-10-CM | POA: Diagnosis not present

## 2023-05-28 ENCOUNTER — Inpatient Hospital Stay: Payer: Medicare PPO | Attending: Hematology

## 2023-05-28 DIAGNOSIS — Z85528 Personal history of other malignant neoplasm of kidney: Secondary | ICD-10-CM | POA: Diagnosis not present

## 2023-05-28 DIAGNOSIS — Z9012 Acquired absence of left breast and nipple: Secondary | ICD-10-CM | POA: Insufficient documentation

## 2023-05-28 DIAGNOSIS — Z923 Personal history of irradiation: Secondary | ICD-10-CM | POA: Insufficient documentation

## 2023-05-28 DIAGNOSIS — Z803 Family history of malignant neoplasm of breast: Secondary | ICD-10-CM | POA: Insufficient documentation

## 2023-05-28 DIAGNOSIS — Z801 Family history of malignant neoplasm of trachea, bronchus and lung: Secondary | ICD-10-CM | POA: Diagnosis not present

## 2023-05-28 DIAGNOSIS — Z8571 Personal history of Hodgkin lymphoma: Secondary | ICD-10-CM | POA: Insufficient documentation

## 2023-05-28 DIAGNOSIS — Z992 Dependence on renal dialysis: Secondary | ICD-10-CM | POA: Insufficient documentation

## 2023-05-28 DIAGNOSIS — Z905 Acquired absence of kidney: Secondary | ICD-10-CM | POA: Insufficient documentation

## 2023-05-28 DIAGNOSIS — I4891 Unspecified atrial fibrillation: Secondary | ICD-10-CM | POA: Diagnosis not present

## 2023-05-28 DIAGNOSIS — D649 Anemia, unspecified: Secondary | ICD-10-CM | POA: Insufficient documentation

## 2023-05-28 DIAGNOSIS — Z86000 Personal history of in-situ neoplasm of breast: Secondary | ICD-10-CM | POA: Insufficient documentation

## 2023-05-28 DIAGNOSIS — I5030 Unspecified diastolic (congestive) heart failure: Secondary | ICD-10-CM | POA: Diagnosis not present

## 2023-05-28 DIAGNOSIS — N186 End stage renal disease: Secondary | ICD-10-CM | POA: Diagnosis not present

## 2023-05-28 DIAGNOSIS — D696 Thrombocytopenia, unspecified: Secondary | ICD-10-CM | POA: Insufficient documentation

## 2023-05-28 LAB — CBC WITH DIFFERENTIAL/PLATELET
Abs Immature Granulocytes: 0.05 10*3/uL (ref 0.00–0.07)
Basophils Absolute: 0.1 10*3/uL (ref 0.0–0.1)
Basophils Relative: 1 %
Eosinophils Absolute: 0.3 10*3/uL (ref 0.0–0.5)
Eosinophils Relative: 3 %
HCT: 34.3 % — ABNORMAL LOW (ref 36.0–46.0)
Hemoglobin: 10.6 g/dL — ABNORMAL LOW (ref 12.0–15.0)
Immature Granulocytes: 1 %
Lymphocytes Relative: 11 %
Lymphs Abs: 1 10*3/uL (ref 0.7–4.0)
MCH: 29.9 pg (ref 26.0–34.0)
MCHC: 30.9 g/dL (ref 30.0–36.0)
MCV: 96.6 fL (ref 80.0–100.0)
Monocytes Absolute: 0.8 10*3/uL (ref 0.1–1.0)
Monocytes Relative: 8 %
Neutro Abs: 7 10*3/uL (ref 1.7–7.7)
Neutrophils Relative %: 76 %
Platelets: 114 10*3/uL — ABNORMAL LOW (ref 150–400)
RBC: 3.55 MIL/uL — ABNORMAL LOW (ref 3.87–5.11)
RDW: 14.8 % (ref 11.5–15.5)
WBC: 9.2 10*3/uL (ref 4.0–10.5)
nRBC: 0 % (ref 0.0–0.2)

## 2023-05-28 LAB — COMPREHENSIVE METABOLIC PANEL
ALT: 14 U/L (ref 0–44)
AST: 23 U/L (ref 15–41)
Albumin: 4.2 g/dL (ref 3.5–5.0)
Alkaline Phosphatase: 157 U/L — ABNORMAL HIGH (ref 38–126)
Anion gap: 10 (ref 5–15)
BUN: 20 mg/dL (ref 8–23)
CO2: 32 mmol/L (ref 22–32)
Calcium: 9.1 mg/dL (ref 8.9–10.3)
Chloride: 93 mmol/L — ABNORMAL LOW (ref 98–111)
Creatinine, Ser: 2.05 mg/dL — ABNORMAL HIGH (ref 0.44–1.00)
GFR, Estimated: 26 mL/min — ABNORMAL LOW (ref >60)
Glucose, Bld: 183 mg/dL — ABNORMAL HIGH (ref 70–99)
Potassium: 2.9 mmol/L — ABNORMAL LOW (ref 3.5–5.1)
Sodium: 135 mmol/L (ref 135–145)
Total Bilirubin: 1.1 mg/dL (ref <1.2)
Total Protein: 8.4 g/dL — ABNORMAL HIGH (ref 6.5–8.1)

## 2023-05-29 LAB — KAPPA/LAMBDA LIGHT CHAINS
Kappa free light chain: 127.1 mg/L — ABNORMAL HIGH (ref 3.3–19.4)
Kappa, lambda light chain ratio: 1.45 (ref 0.26–1.65)
Lambda free light chains: 87.9 mg/L — ABNORMAL HIGH (ref 5.7–26.3)

## 2023-05-30 DIAGNOSIS — N186 End stage renal disease: Secondary | ICD-10-CM | POA: Diagnosis not present

## 2023-05-30 DIAGNOSIS — Z992 Dependence on renal dialysis: Secondary | ICD-10-CM | POA: Diagnosis not present

## 2023-05-30 DIAGNOSIS — I5030 Unspecified diastolic (congestive) heart failure: Secondary | ICD-10-CM | POA: Diagnosis not present

## 2023-05-30 LAB — PROTEIN ELECTROPHORESIS, SERUM
A/G Ratio: 0.9 (ref 0.7–1.7)
Albumin ELP: 3.8 g/dL (ref 2.9–4.4)
Alpha-1-Globulin: 0.4 g/dL (ref 0.0–0.4)
Alpha-2-Globulin: 0.8 g/dL (ref 0.4–1.0)
Beta Globulin: 1 g/dL (ref 0.7–1.3)
Gamma Globulin: 1.9 g/dL — ABNORMAL HIGH (ref 0.4–1.8)
Globulin, Total: 4.1 g/dL — ABNORMAL HIGH (ref 2.2–3.9)
Total Protein ELP: 7.9 g/dL (ref 6.0–8.5)

## 2023-05-31 ENCOUNTER — Other Ambulatory Visit: Payer: Self-pay

## 2023-05-31 DIAGNOSIS — G4731 Primary central sleep apnea: Secondary | ICD-10-CM | POA: Diagnosis not present

## 2023-05-31 DIAGNOSIS — N186 End stage renal disease: Secondary | ICD-10-CM | POA: Diagnosis not present

## 2023-05-31 DIAGNOSIS — I5030 Unspecified diastolic (congestive) heart failure: Secondary | ICD-10-CM | POA: Diagnosis not present

## 2023-05-31 DIAGNOSIS — N184 Chronic kidney disease, stage 4 (severe): Secondary | ICD-10-CM

## 2023-05-31 DIAGNOSIS — Z992 Dependence on renal dialysis: Secondary | ICD-10-CM | POA: Diagnosis not present

## 2023-06-03 DIAGNOSIS — N186 End stage renal disease: Secondary | ICD-10-CM | POA: Diagnosis not present

## 2023-06-03 DIAGNOSIS — I5030 Unspecified diastolic (congestive) heart failure: Secondary | ICD-10-CM | POA: Diagnosis not present

## 2023-06-03 DIAGNOSIS — Z992 Dependence on renal dialysis: Secondary | ICD-10-CM | POA: Diagnosis not present

## 2023-06-03 LAB — IMMUNOFIXATION ELECTROPHORESIS
IgA: 311 mg/dL (ref 87–352)
IgG (Immunoglobin G), Serum: 1716 mg/dL — ABNORMAL HIGH (ref 586–1602)
IgM (Immunoglobulin M), Srm: 191 mg/dL (ref 26–217)
Total Protein ELP: 8.1 g/dL (ref 6.0–8.5)

## 2023-06-04 ENCOUNTER — Inpatient Hospital Stay: Payer: Medicare PPO | Admitting: Hematology

## 2023-06-04 VITALS — BP 122/42 | HR 83 | Temp 98.1°F | Resp 16 | Wt 115.0 lb

## 2023-06-04 DIAGNOSIS — Z9012 Acquired absence of left breast and nipple: Secondary | ICD-10-CM | POA: Diagnosis not present

## 2023-06-04 DIAGNOSIS — D696 Thrombocytopenia, unspecified: Secondary | ICD-10-CM | POA: Diagnosis not present

## 2023-06-04 DIAGNOSIS — G4731 Primary central sleep apnea: Secondary | ICD-10-CM | POA: Diagnosis not present

## 2023-06-04 DIAGNOSIS — Z8571 Personal history of Hodgkin lymphoma: Secondary | ICD-10-CM | POA: Diagnosis not present

## 2023-06-04 DIAGNOSIS — D649 Anemia, unspecified: Secondary | ICD-10-CM | POA: Diagnosis not present

## 2023-06-04 DIAGNOSIS — Z85528 Personal history of other malignant neoplasm of kidney: Secondary | ICD-10-CM | POA: Diagnosis not present

## 2023-06-04 DIAGNOSIS — I5030 Unspecified diastolic (congestive) heart failure: Secondary | ICD-10-CM | POA: Diagnosis not present

## 2023-06-04 DIAGNOSIS — Z905 Acquired absence of kidney: Secondary | ICD-10-CM | POA: Diagnosis not present

## 2023-06-04 DIAGNOSIS — N186 End stage renal disease: Secondary | ICD-10-CM | POA: Diagnosis not present

## 2023-06-04 DIAGNOSIS — Z86000 Personal history of in-situ neoplasm of breast: Secondary | ICD-10-CM | POA: Diagnosis not present

## 2023-06-04 DIAGNOSIS — Z992 Dependence on renal dialysis: Secondary | ICD-10-CM | POA: Diagnosis not present

## 2023-06-04 NOTE — Progress Notes (Signed)
[facility] [address], [place] [address]    Clinic Day:  06/04/2023  Referring physician: Aliene Beams, MD  Patient Care Team: Aliene Beams, MD as PCP - General (Family Medicine) Marinus Maw, MD as PCP - Electrophysiology (Cardiology) Quintella Reichert, MD as PCP - Sleep Medicine (Sleep Medicine) Laurey Morale, MD as PCP - Advanced Heart Failure (Cardiology) Rothbart, Gerrit Friends, MD (Cardiology) Iva Boop, MD (Gastroenterology) Altheimer, Casimiro Needle, MD (Endocrinology) Heloise Purpura, MD (Urology) Reece Packer, MD (Hematology and Oncology) Nathen May, MD as Referring Physician (Endocrinology)   ASSESSMENT & PLAN:   Assessment: 1.  Mild thrombocytopenia: -CT of the abdomen did not show any splenomegaly.  Previous CT disorder work-up was negative. -Peripheral blood smear showed giant platelets.  Clinically consistent with immune mediated thrombocytopenia.   2.  Mild hypercalcemia: -Her calcium level for the last 3 times in our clinic was normal to upper limit of normal. -Previous vitamin D was 75.  Intact PTH was normal.  Nonparathyroid hypercalcemia.  Calcitriol was 16.5.  PTH RP was less than 2. -SPEP and immunofixation was negative.  Free light chain ratio was elevated.   3.  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 -Last mammogram on 08/04/2019 reviewed by me was BI-RADS Category 4 with 0.6 cm mixed echogenicity mass in the right breast at 9:00. -Aspiration of this 0.6 cm complex cyst was successful on 08/24/2019.  Aspiration of the 0.4 cm cyst in the right breast 9:00 was successful.  Routine annual mammogram was recommended.   4.  Stage IIb Hodgkin's disease: -She received mantle radiation in 1981.  Continues to be in remission since then.  Does not have any B symptoms.   5.  Chromophobe renal cell carcinoma: -Nephrectomy in 2008.  Last CT in September 2019 showed stable 14 mm left renal  lesion.    Plan: 1.  Mild thrombocytopenia: - She has mild thrombocytopenia since 2016.  Latest platelet count is 114 and stable.  She does not have any bleeding issues. - RTC 1 year for follow-up with repeat CBC.   2.  Mild hypercalcemia: - She was reportedly started on hemodialysis on 05/02/2023. - Latest calcium is normal at 9.1 with almond 4.2. - Reviewed SPEP: No M spike.  FLC ratio is normal at 1.45.  Immunofixation shows polyclonal gammopathy.  No need to repeat myeloma labs at next visit.  3.  Normocytic anemia: - Hemoglobin is 10.6 with MCV 96.  She is receiving Venofer 100 mg with each dialysis.  Orders Placed This Encounter  Procedures   CBC with Differential    Standing Status:   Future    Standing Expiration Date:   06/03/2024   Comprehensive metabolic panel    Standing Status:   Future    Standing Expiration Date:   06/03/2024      Mikeal Hawthorne R Teague,acting as a scribe for Doreatha Massed, MD.,have documented all relevant documentation on the behalf of Doreatha Massed, MD,as directed by  Doreatha Massed, MD while in the presence of Doreatha Massed, MD.  I, Doreatha Massed MD, have reviewed the above documentation for accuracy and completeness, and I agree with the above.    Doreatha Massed, MD   11/12/20244:51 PM  CHIEF COMPLAINT:   Diagnosis: thrombocytopenia    Cancer Staging  No matching staging information was found for the patient.    Prior Therapy: none  Current Therapy:  surveillance   HISTORY  OF PRESENT ILLNESS:   Oncology History   No history exists.     INTERVAL HISTORY:   Shaleya is a 72 y.o. female presenting to clinic today for follow up of thrombocytopenia. She was last seen by me on 11/20/22.  Since her last visit, she underwent right upper arm brachiobasilic arteriovenous fistula creation with insertion of tunneled dialysis catheter on 05/01/23 with Dr. Myra Gianotti.   Today, she states that she is doing well  overall. Her appetite level is at 80%. Her energy level is at 20%. She is accompanied by her husband.  She reports SOB, dizziness, nausea, and chills when receiving hemodialysis. She started dialysis on 05/02/23 and has been receiving 100 mL of iron with dialysis. She will be doing at home dialysis training in December 2024. She received a Hep-B vaccine at her last dialysis. She denies any bleeding issues.   She reports pain and soreness where her left mastectomy site is during and for a short period after dialysis.   PAST MEDICAL HISTORY:   Past Medical History: Past Medical History:  Diagnosis Date   Anxiety    Aortic stenosis    a. bicuspid aortic valve; mean gradient of 20 mmHg in 2/10; b. s/p bioprosth AVR (19-mm Edwards pericardial valve) with a redo coronary artery bypass graft procedure in 07/2009; postoperative Dressler's syndrome     Arthritis    Breast cancer (HCC)    Carcinoma, renal cell (HCC) 05/2007   Laparoscopic right nephrectomy   Chronic diastolic CHF (congestive heart failure) (HCC)    a. 9.2014 EchoP EF 60-65%, no rwma, bioprosth AVR, mean gradient of , mild to mod MR, PASP .   CKD (chronic kidney disease), stage IV (HCC)    Colitis, ischemic (HCC) 2008   Complication of anesthesia    Coronary artery disease    a. initial CABG with SVG-RCA in 12/05. b. redo SVG-RCA and bioprosthetic AVR in 1/11. d. 9/14 - PCI with DES to distal LM/proximal LAD was done rather than bypass as she would have been a poor candidate for redo sternotomy. d. S/p DES to prox LM 05/2014.   Diabetes mellitus 03/2010    TYPE II: Hemoglobin A1c of 7.4 in  03/2010; 8.4 in 06/2010; treated with insulin pump   Duodenal ulcer    Remote; H. pylori positive   Dyspnea    Gastroparesis due to DM (HCC) 05/01/2012   Glaucoma 1998   Hodgkin's disease (HCC) 1991   Mantle radiation therapy   Hyperlipidemia    Hyperplastic gastric polyp 06/23/2012   Hypertension    Iron deficiency anemia     a. 03/2013 EGD: essentially normal. Possible slow GIB.   Migraines    NASH (nonalcoholic steatohepatitis) 1999   -biopsy in 1999   OSA treated with BiPAP    Osteoporosis    Pacemaker    six sinus syndrome   PAF (paroxysmal atrial fibrillation) (HCC)    a.  h/o brief episodes noted on ppm interrogation. b. given GI bleeding and need to be on both ASA 81 and Brilinta, risks likely outweigh benefits of anticoagulation.    PONV (postoperative nausea and vomiting)    Small bowel obstruction (HCC)    Recurrent; resolved after resection of a lipoma   Syncope    a. H/o recurrent syncope with pauses on loop recorder s/p Medtronic pacemaker 07/2012.    Surgical History: Past Surgical History:  Procedure Laterality Date   ABDOMINAL HYSTERECTOMY  2000   AORTIC VALVE REPLACEMENT  2011  Aortic valve replacement surgery with a 19 mm bioprosthetic device post redo CABG January 2011.   AV FISTULA PLACEMENT Right 05/01/2023   Procedure: RIGHT UPPER ARM BRACHIOBASILIC ARTERIOVENOUS FISTULA CREATION;  Surgeon: Nada Libman, MD;  Location: [facility] OR;  Service: Vascular;  Laterality: Right;   BONE MARROW BIOPSY     BREAST EXCISIONAL BIOPSY     benign, bilateral   CARDIAC CATHETERIZATION     CERVICAL BIOPSY     COLONOSCOPY     Multiple with adenomatous polyps   COLONOSCOPY  06/23/2012   Procedure: COLONOSCOPY;  Surgeon: Iva Boop, MD;  Location: [facility] ENDOSCOPY;  Service: Endoscopy;  Laterality: N/A;   CORONARY ARTERY BYPASS GRAFT  2005, 2011   CABG-most recent SVG to RCA-12/05; RCA occluded with patent graft in 2006; Redo bypass in 07/2009   ESOPHAGOGASTRODUODENOSCOPY     ESOPHAGOGASTRODUODENOSCOPY  06/23/2012   Procedure: ESOPHAGOGASTRODUODENOSCOPY (EGD);  Surgeon: Iva Boop, MD;  Location: [name] ENDOSCOPY;  Service: Endoscopy;  Laterality: N/A;   ESOPHAGOGASTRODUODENOSCOPY N/A 04/06/2013   Procedure: ESOPHAGOGASTRODUODENOSCOPY (EGD);  Surgeon: Hilarie Fredrickson, MD;  Location: [facility] ENDOSCOPY;  Service:  Endoscopy;  Laterality: N/A;   EXPLORATORY LAPAROTOMY W/ BOWEL RESECTION     Small bowel resection of lipoma   GIVENS CAPSULE STUDY N/A 04/30/2013   Procedure: GIVENS CAPSULE STUDY;  Surgeon: Iva Boop, MD;  Location: [facility] ENDOSCOPY;  Service: Endoscopy;  Laterality: N/A;   INSERTION OF DIALYSIS CATHETER Right 05/01/2023   Procedure: INSERTION OF TUNNELED DIALYSIS CATHETER USING PALINDROME CATHETER 23CM;  Surgeon: Nada Libman, MD;  Location: [facility] OR;  Service: Vascular;  Laterality: Right;   LEFT AND RIGHT HEART CATHETERIZATION WITH CORONARY ANGIOGRAM N/A 04/07/2013   Procedure: LEFT AND RIGHT HEART CATHETERIZATION WITH CORONARY ANGIOGRAM;  Surgeon: Laurey Morale, MD;  Location: [facility] CATH LAB;  Service: Cardiovascular;  Laterality: N/A;   LEFT ATRIAL APPENDAGE OCCLUSION N/A 02/16/2016   Watchman not placed - nickel allergy   LEFT HEART CATHETERIZATION WITH CORONARY/GRAFT ANGIOGRAM N/A 06/07/2014   Procedure: LEFT HEART CATHETERIZATION WITH Isabel Caprice;  Surgeon: Kathleene Hazel, MD;  Location: [facility] CATH LAB;  Service: Cardiovascular;  Laterality: N/A;   LIVER BIOPSY     LOOP RECORDER IMPLANT N/A 12/10/2011   Procedure: LOOP RECORDER IMPLANT;  Surgeon: Marinus Maw, MD;  Location: [facility] CATH LAB;  Service: Cardiovascular;  Laterality: N/A;   MALONEY DILATION  06/23/2012   Procedure: Elease Hashimoto DILATION;  Surgeon: Iva Boop, MD;  Location: [facility] ENDOSCOPY;  Service: Endoscopy;;  54 fr   MASTECTOMY Left 05/17/2015   total - no lymph nodes removed per patient   NEPHRECTOMY  2008   Laparoscopic right nephrectomy, renal cell carcinoma   PACEMAKER INSERTION  08/21/2012   Medtronic Adapta L dual-chamber pacemaker   PERCUTANEOUS CORONARY STENT INTERVENTION (PCI-S) N/A 04/09/2013   Procedure: PERCUTANEOUS CORONARY STENT INTERVENTION (PCI-S);  Surgeon: Kathleene Hazel, MD;  Location: [facility] CATH LAB;  Service: Cardiovascular;  Laterality: N/A;   PERMANENT PACEMAKER INSERTION  N/A 08/21/2012   Procedure: PERMANENT PACEMAKER INSERTION;  Surgeon: Marinus Maw, MD;  Location: [facility] CATH LAB;  Service: Cardiovascular;  Laterality: N/A;   RIGHT HEART CATH N/A 03/18/2019   Procedure: RIGHT HEART CATH;  Surgeon: Laurey Morale, MD;  Location: [facility] INVASIVE CV LAB;  Service: Cardiovascular;  Laterality: N/A;   RIGHT HEART CATH N/A 08/21/2022   Procedure: RIGHT HEART CATH;  Surgeon: Laurey Morale, MD;  Location: [facility] INVASIVE CV LAB;  Service: Cardiovascular;  Laterality: N/A;   RIGHT HEART CATH AND CORONARY/GRAFT ANGIOGRAPHY N/A 04/19/2017   Procedure: RIGHT HEART CATH AND CORONARY/GRAFT ANGIOGRAPHY;  Surgeon: Laurey Morale, MD;  Location: [facility] INVASIVE CV LAB;  Service: Cardiovascular;  Laterality: N/A;   TEE WITHOUT CARDIOVERSION N/A 12/07/2015   Procedure: TRANSESOPHAGEAL ECHOCARDIOGRAM (TEE);  Surgeon: Laurey Morale, MD;  Location: [facility] ENDOSCOPY;  Service: Cardiovascular;  Laterality: N/A;   TOTAL ABDOMINAL HYSTERECTOMY W/ BILATERAL SALPINGOOPHORECTOMY  2000   endometriosis and ovarian cysts   TOTAL MASTECTOMY Left 05/17/2015   Procedure: LEFT TOTAL MASTECTOMY;  Surgeon: Emelia Loron, MD;  Location: [facility] OR;  Service: General;  Laterality: Left;   TUMOR EXCISION  1981   removal of Hodgkins lymphoma    Social History: Social History   Socioeconomic History   Marital status: Married    Spouse name: Not on file   Number of children: 2   Years of education: Not on file   Highest education level: Doctorate  Occupational History   Occupation: retired    Comment: elementary principal  Tobacco Use   Smoking status: Never   Smokeless tobacco: Never  Vaping Use   Vaping status: Never Used  Substance and Sexual Activity   Alcohol use: No    Comment: social, 38s   Drug use: Never   Sexual activity: Yes  Other Topics Concern   Not on file  Social History Narrative   Retired Programmer, multimedia principal, married    Started disability September 2013      Has one  daughter in [place], works at [employer]. Has a son, Arlys John, who is Press photographer.   Eats all food groups.   Wear seatbelt.    Attends church. [place], [place]. [place].    Social Determinants of Health   Financial Resource Strain: Low Risk  (01/02/2023)   Received from [facility], [facility]   Overall Financial Resource Strain (CARDIA)    Difficulty of Paying Living Expenses: Not hard at all  Food Insecurity: No Food Insecurity (01/02/2023)   Received from [facility], [facility]   Hunger Vital Sign    Worried About Running Out of Food in the Last Year: Never true    Ran Out of Food in the Last Year: Never true  Transportation Needs: No Transportation Needs (01/02/2023)   Received from [facility], [facility]   [facility] - Transportation    In the past 12 months, has lack of transportation kept you from medical appointments or from getting medications?: No    Lack of Transportation (Non-Medical): No  Physical Activity: Insufficiently Active (03/17/2021)   Received from [facility]   Exercise Vital Sign  Stress: Stress Concern Present (03/17/2021)   Received from [facility], [facility]   [employer] of Occupational Health - Occupational Stress Questionnaire    Feeling of Stress : To some extent  Social Connections: Not on file  Intimate Partner Violence: Not on file    Family History: Family History  Problem Relation Age of Onset   Heart attack Mother        d.61   Heart disease Mother    Anal fissures Mother    Heart attack Father        d.60   Diabetes Father    Heart disease Father    Lung cancer Sister        d.61 metastases to brain. History of  smoking. Maternal half-sister.   Cancer Sister 30       unspecified gynecologic cancer (either cervical or ovarian) in her 30s.   Breast cancer Sister 57        d.42 from metastatic disease. Paternal half-sister.   Hypertension Brother    Glaucoma Brother    Parkinson's disease Paternal Aunt    Heart attack Maternal Grandmother    Cancer Maternal Grandfather 50       d.52s oral cancer   Colon cancer Neg Hx    Stomach cancer Neg Hx    Parkinsonism Neg Hx     Current Medications:  Current Outpatient Medications:    acyclovir ointment (ZOVIRAX) 5 %, Apply 1 application topically every 3 (three) hours as needed (fever blister/cold sores)., Disp: , Rfl:    ALPRAZolam (XANAX) 0.5 MG tablet, Take 1 tablet (0.5 mg total) by mouth 2 (two) times daily as needed for anxiety., Disp: 60 tablet, Rfl: 1   APIDRA 100 UNIT/ML injection, Inject 40 Units into the skin as directed. INFUSE THROUGH INSULIN PUMP UTD, Bolus depends on carb intake, Disp: , Rfl: 2   aspirin EC 81 MG tablet, Take 81 mg by mouth at bedtime., Disp: , Rfl:    BRILINTA 60 MG TABS tablet, TAKE ONE TABLET (60MG  TOTAL) BY MOUTH TWICE DAILY, Disp: 180 tablet, Rfl: 3   cetirizine (ZYRTEC) 10 MG tablet, Take 10 mg by mouth in the morning., Disp: , Rfl:    clobetasol (OLUX) 0.05 % topical foam, Apply 1 application  topically 2 (two) times daily as needed (skin flaking (legs))., Disp: , Rfl:    Coenzyme Q10 (CO Q 10) 100 MG CAPS, Take 100 mg by mouth at bedtime. , Disp: , Rfl:    colchicine 0.6 MG tablet, Take 0.6 mg by mouth daily as needed (gout flare)., Disp: , Rfl:    denosumab (PROLIA) 60 MG/ML SOSY injection, Inject 60 mg into the skin every 6 (six) months., Disp: , Rfl:    diclofenac Sodium (VOLTAREN) 1 % GEL, Apply 2 g topically 4 (four) times daily as needed (pain.)., Disp: , Rfl:    escitalopram (LEXAPRO) 5 MG tablet, Take 5 mg by mouth at bedtime., Disp: , Rfl:    famotidine (PEPCID) 40 MG tablet, Take 40 mg by mouth at bedtime., Disp: , Rfl:    gabapentin (NEURONTIN) 100 MG capsule, Take 100 mg by mouth daily., Disp: , Rfl:    glucagon (GLUCAGON EMERGENCY) 1 MG injection, Inject 1 mg  into the vein once as needed (low blood sugar)., Disp: , Rfl:    isosorbide mononitrate (IMDUR) 120 MG 24 hr tablet, Take 1 tablet (120 mg total) by mouth every morning., Disp: , Rfl:    latanoprost (XALATAN) 0.005 % ophthalmic solution, Place 1 drop into both eyes at bedtime., Disp: , Rfl:    metolazone (ZAROXOLYN) 2.5 MG tablet, Take 2.5 mg (1 tab) once as directed by CHF clinic. Call before taking [phone], Disp: 3 tablet, Rfl: 0   metoprolol succinate (TOPROL XL) 25 MG 24 hr tablet, Take 0.5 tablets (12.5 mg total) by mouth daily., Disp: 45 tablet, Rfl: 3   midodrine (PROAMATINE) 5 MG tablet, Take 1 tablet (5 mg total) by mouth daily as needed (Prior to HD). (Patient taking differently: Take 10 mg by mouth daily as needed (Prior to HD).), Disp: 30 tablet, Rfl: 3   mometasone (NASONEX) 50 MCG/ACT nasal spray, Place 1 spray into the nose daily as needed (allergies)., Disp: , Rfl:  mupirocin cream (BACTROBAN) 2 %, Apply 1 application topically 2 (two) times daily as needed., Disp: 90 g, Rfl: 0   nitroGLYCERIN (NITROLINGUAL) 0.4 MG/SPRAY spray, PLACE 1 SPRAY UNDER TH TONGUE EVERY 5 MINUTES FOR 3 DOSES AS NEEDED FOR CHEST PAIN, Disp: 4.9 g, Rfl: 5   polyethylene glycol (MIRALAX / GLYCOLAX) 17 g packet, Take 17 g by mouth in the morning. Hold for loose stools, Disp: , Rfl:    pravastatin (PRAVACHOL) 10 MG tablet, TAKE ONE TABLET (10 MG TOTAL) BY MOUTH DAILY., Disp: 90 tablet, Rfl: 3   Probiotic Product (ALIGN PO), Take 1 tablet by mouth daily in the afternoon., Disp: , Rfl:    REPATHA SURECLICK 140 MG/ML SOAJ, INJECT 140MG  INTO THE SKIN EVERY 14 DAYS, Disp: 6 mL, Rfl: 3   torsemide (DEMADEX) 20 MG tablet, Take 40 mg Twice daily on Wed,sat and sun, Disp: 64 tablet, Rfl: 3   ULORIC 40 MG tablet, Take 1 tablet (40 mg total) by mouth at bedtime., Disp: 90 tablet, Rfl: 3   VASCEPA 1 g capsule, TAKE TWO CAPSULES (2 GRAMS TOTAL) BY MOUTH TWO TIMES DAILY, Disp: 360 capsule, Rfl: 3    Allergies: Allergies  Allergen Reactions   Avandia [Rosiglitazone Maleate] Other (See Comments)    Congestive heart failure   Cephalexin Anaphylaxis, Swelling and Rash    Extremities swell , throat swelling    Clindamycin Anaphylaxis and Shortness Of Breath    Other reaction(s): esophageal spasms   Humalog [Insulin Lispro] Itching     big knots and whelp    Lincomycin Anaphylaxis and Shortness Of Breath   Metformin Other (See Comments)    Congestive heart failure   Novolog [Insulin Aspart] Hives and Itching    Humalog & Novolog big knots and whelp     Penicillins Anaphylaxis, Hives, Swelling and Rash    Did it involve swelling of the face/tongue/throat, SOB, or low BP? Yes Did it involve sudden or severe rash/hives, skin peeling, or any reaction on the inside of your mouth or nose? No Did you need to seek medical attention at a hospital or doctor's office? Yes When did it last happen?      Childhood allergy  If all above answers are "NO", may proceed with cephalosporin use.    Tizanidine Other (See Comments)    Dizziness, Mental Status Changes, Hallucination     Adhesive [Tape] Other (See Comments)    Tears skin, Please use "paper" tape   Atorvastatin Other (See Comments)    increased LFT's, no muscle weakness, leg pain Muscle and joint pain increased LFT's  Increased LFTs   Cholestyramine Other (See Comments)    Liver Disorder Elevated LFTs     Gentamycin [Gentamicin] Hives, Itching and Rash    Fine red bumps.  Reaction noted post loop recorder implant, where gentamycin was used for irrigation. Topical prep   Iodinated Contrast Media Other (See Comments)    PAIN DURING LYMPHANGIOGRAM '81, NO ALLERGY TO IV CONTRAST.  Has had procedures with Iodine since without problems.    Limonene Hives, Itching, Rash and Other (See Comments)    Reaction noted post loop recorder implant, where gentamycin was used for irrigation. Topical prep   Prednisone Itching and Other (See  Comments)    B/P went high, itching all over.  Tolerates with benadryl    Ranexa [Ranolazine] Other (See Comments)    Nausea, dizziness, low blood sugar, tingling in right hand, "blood blisters" on tongue    Rosuvastatin Nausea And  Vomiting, Nausea Only and Other (See Comments)    Muscle and joint pain & increased LFTs; tolerates Pravastatin 10mg  (max) Other reaction(s): elevated LFTs   Simvastatin Other (See Comments)    Increased LFT's    Zinc     Other Reaction(s): Unknown   Acetaminophen     Pt told not to take tylenol due to liver issues   Azelastine Other (See Comments)    Other reaction(s): metallic taste,spitting up blood   Clopidogrel Other (See Comments)    Does not work for patient Medicine was not effective Other reaction(s): Unknown   Kerendia [Finerenone] Hives and Nausea Only   Lisinopril Diarrhea, Nausea Only and Other (See Comments)   Neosporin [Bacitracin-Polymyxin B] Dermatitis   Codeine Nausea And Vomiting   Erythromycin Nausea And Vomiting   Hydrocodone-Acetaminophen Itching, Rash and Other (See Comments)    itching   Latex Itching and Other (See Comments)    I"if wear gloves hands get red ,itchy no prioblem if other wear and touch me"   Neomycin Rash and Other (See Comments)    Redness   Nickel Itching   Ranitidine Nausea Only   Tamiflu [Oseltamivir] Nausea And Vomiting   Tramadol Nausea Only    REVIEW OF SYSTEMS:   Review of Systems  Constitutional:  Negative for chills, fatigue and fever.  HENT:   Negative for lump/mass, mouth sores, nosebleeds, sore throat and trouble swallowing.   Eyes:  Negative for eye problems.  Respiratory:  Positive for shortness of breath. Negative for cough.   Cardiovascular:  Positive for palpitations (A-fib). Negative for chest pain and leg swelling.  Gastrointestinal:  Positive for constipation and nausea. Negative for abdominal pain, diarrhea and vomiting.  Genitourinary:  Negative for bladder incontinence, difficulty  urinating, dysuria, frequency, hematuria and nocturia.   Musculoskeletal:  Negative for arthralgias, back pain, flank pain, myalgias and neck pain.       +pain and soreness at left mastectomy site during dialysis  Skin:  Negative for itching and rash.  Neurological:  Positive for dizziness (occasional), headaches (occasional) and numbness (in head after dialysis).  Hematological:  Does not bruise/bleed easily.  Psychiatric/Behavioral:  Positive for sleep disturbance. Negative for depression and suicidal ideas. The patient is not nervous/anxious.   All other systems reviewed and are negative.    VITALS:   Blood pressure (!) 122/42, pulse 83, temperature 98.1 F (36.7 C), temperature source Oral, resp. rate 16, weight 115 lb (52.2 kg), SpO2 99%.  Wt Readings from Last 3 Encounters:  06/04/23 115 lb (52.2 kg)  05/22/23 118 lb (53.5 kg)  05/08/23 120 lb 12.8 oz (54.8 kg)    Body mass index is 21.03 kg/m.  Performance status (ECOG): 1 - Symptomatic but completely ambulatory  PHYSICAL EXAM:   Physical Exam Vitals and nursing note reviewed. Exam conducted with a chaperone present.  Constitutional:      Appearance: Normal appearance.  Cardiovascular:     Rate and Rhythm: Normal rate and regular rhythm.     Pulses: Normal pulses.     Heart sounds: Normal heart sounds.  Pulmonary:     Effort: Pulmonary effort is normal.     Breath sounds: Normal breath sounds.  Abdominal:     Palpations: Abdomen is soft. There is no hepatomegaly, splenomegaly or mass.     Tenderness: There is no abdominal tenderness.  Musculoskeletal:     Right lower leg: No edema.     Left lower leg: No edema.  Lymphadenopathy:  Cervical: No cervical adenopathy.     Right cervical: No superficial, deep or posterior cervical adenopathy.    Left cervical: No superficial, deep or posterior cervical adenopathy.     Upper Body:     Right upper body: No supraclavicular or axillary adenopathy.     Left upper body:  No supraclavicular or axillary adenopathy.  Neurological:     General: No focal deficit present.     Mental Status: She is alert and oriented to person, place, and time.  Psychiatric:        Mood and Affect: Mood normal.        Behavior: Behavior normal.     LABS:      Latest Ref Rng & Units 05/28/2023    2:45 PM 05/01/2023    7:45 AM 11/12/2022    2:00 PM  CBC  WBC 4.0 - 10.5 K/uL 9.2   8.0   Hemoglobin 12.0 - 15.0 g/dL 96.2  95.2  84.1   Hematocrit 36.0 - 46.0 % 34.3  38.0  38.5   Platelets 150 - 400 K/uL 114   120       Latest Ref Rng & Units 05/28/2023    2:45 PM 05/01/2023    7:45 AM 04/18/2023   10:42 AM  CMP  Glucose 70 - 99 mg/dL 324  401  027   BUN 8 - 23 mg/dL 20  77  80   Creatinine 0.44 - 1.00 mg/dL 2.53  6.64  4.03   Sodium 135 - 145 mmol/L 135  138  134   Potassium 3.5 - 5.1 mmol/L 2.9  4.1  4.5   Chloride 98 - 111 mmol/L 93  100  97   CO2 22 - 32 mmol/L 32   25   Calcium 8.9 - 10.3 mg/dL 9.1   47.4   Total Protein 6.5 - 8.1 g/dL 8.4     Total Bilirubin <1.2 mg/dL 1.1     Alkaline Phos 38 - 126 U/L 157     AST 15 - 41 U/L 23     ALT 0 - 44 U/L 14        No results found for: "CEA1", "CEA" / No results found for: "CEA1", "CEA" No results found for: "PSA1" No results found for: "CAN199" No results found for: "CAN125"  Lab Results  Component Value Date   TOTALPROTELP 8.1 05/28/2023   TOTALPROTELP 7.9 05/28/2023   ALBUMINELP 3.8 05/28/2023   A1GS 0.4 05/28/2023   A2GS 0.8 05/28/2023   BETS 1.0 05/28/2023   GAMS 1.9 (H) 05/28/2023   MSPIKE Not Observed 05/28/2023   SPEI Comment 05/28/2023   Lab Results  Component Value Date   TIBC 365 11/16/2019   TIBC 309 12/13/2016   TIBC 248 10/17/2015   FERRITIN 157 11/16/2019   FERRITIN 294 (H) 09/16/2019   FERRITIN 477 (H) 12/13/2016   IRONPCTSAT 24 11/16/2019   IRONPCTSAT 22 12/13/2016   IRONPCTSAT 38 10/17/2015   Lab Results  Component Value Date   LDH 142 05/09/2021   LDH 137 04/25/2020   LDH  156 11/16/2019     STUDIES:   LONG TERM MONITOR (3-14 DAYS)  Result Date: 06/02/2023 Patch Wear Time:  8 days and 22 hours (2024-10-16T09:37:02-399 to 2024-10-25T08:27:37-0400) Patient had a min HR of 65 bpm, max HR of 136 bpm, and avg HR of 90 bpm. Predominant underlying rhythm was Possible Atrial and Ventricular Pacing. 1 run of Supraventricular Tachycardia occurred lasting 4 beats with a max rate  of 112 bpm (avg 107 bpm). Atrial Fibrillation/Flutter occurred (<1% burden), ranging from 65-136 bpm (avg of 85 bpm), the longest lasting 2 hours 0 mins with an avg rate of 85 bpm. Isolated SVEs were rare (<1.0%), SVE Couplets were rare (<1.0%), and no SVE Triplets were present. Isolated VEs were rare (<1.0%), VE Couplets were rare (<1.0%), and no VE Triplets were present. Ventricular Trigeminy was present. Conclusion: 1. Predominantly NSR. 2. Atrial fibrillation/flutter possible, lasted about 2 hrs. 3. No significant pauses or bradycardia. 4. Minimal PVCs.   NM PET CT CARDIAC PERFUSION MULTI W/ABSOLUTE BLOODFLOW  Result Date: 05/16/2023   There is small partially reversible inferolateral perfusion defect.  However, there are several high risk findings, including TID (1.29) and drop in EF with stress.  Myocardial blood flow reserve is markedly abnormal (1.08), though can be unreliable in presence of prior revascularization.  Overall, study is high risk   LV perfusion is abnormal. Defect 1: There is a small defect with mild reduction in uptake present in the apical to mid inferolateral location(s) that is partially reversible. Consistent with peri-infarct ischemia.   Rest left ventricular function is normal. Rest EF: 68%. Stress left ventricular function is normal. Stress EF: 55%. End diastolic cavity size is normal.   Myocardial blood flow was computed to be 1.64ml/g/min at rest and 1.43ml/g/min at stress. Global myocardial blood flow reserve was 1.08 and was abnormal.   Coronary calcium assessment not  performed due to prior revascularization.   Findings are consistent with ischemia. The study is high risk.   Electronically signed by Epifanio Lesches, MD CLINICAL DATA:  This over-read does not include interpretation of cardiac or coronary anatomy or pathology. The interpretation by the cardiologist is attached. This over-read does not include interpretation of PET data, cardiac/coronary anatomy or pathology. The Cardiac PET CT interpretation by the cardiologist is attached. COMPARISON:  08/10/2022 diagnostic chest CT. FINDINGS: Increase in moderate right-sided pleural effusion with extensive loculation including in the right major fissure. A small left pleural effusion is new since the prior CT. Artifact degradation from pacer battery. Some areas of mild smooth septal thickening including at the lung bases. Upper lung bilateral peribronchovascular patchy ground-glass is new. Motion degradation throughout. 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 Dense aortic atherosclerosis. No imaged thoracic adenopathy. Right-sided dialysis catheter terminates in the low right atrium. Normal imaged portions of the liver, spleen, stomach. Left mastectomy.  Osteopenia. IMPRESSION: Increase in moderate right pleural effusion with extensive loculation. New small left pleural effusion. Probable interstitial edema, as evidenced by septal thickening and peribronchovascular ground-glass. Motion degradation. Electronically Signed   By: Jeronimo Greaves M.D.   On: 05/16/2023 14:35

## 2023-06-04 NOTE — Patient Instructions (Addendum)
[facility] at [facility] Discharge Instructions   You were seen and examined today by Dr. Ellin Saba.  He reviewed the results of your lab work which were all normal/stable.   We will see you back in 1 year. We will repeat lab work at that time.   Return as scheduled.    Thank you for choosing [facility] at [facility] to provide your oncology and hematology care.  To afford each patient quality time with our provider, please arrive at least 15 minutes before your scheduled appointment time.   If you have a lab appointment with the Cancer Center please come in thru the Main Entrance and check in at the main information desk.  You need to re-schedule your appointment should you arrive 10 or more minutes late.  We strive to give you quality time with our providers, and arriving late affects you and other patients whose appointments are after yours.  Also, if you no show three or more times for appointments you may be dismissed from the clinic at the providers discretion.     Again, thank you for choosing [facility].  Our hope is that these requests will decrease the amount of time that you wait before being seen by our physicians.       _____________________________________________________________  Should you have questions after your visit to [facility], please contact our office at [phone] and follow the prompts.  Our office hours are 8:00 a.m. and 4:30 p.m. Monday - Friday.  Please note that voicemails left after 4:00 p.m. may not be returned until the following business day.  We are closed weekends and major holidays.  You do have access to a nurse 24-7, just call the main number to the clinic [phone] and do not press any options, hold on the line and a nurse will answer the phone.    For prescription refill requests, have your pharmacy contact our office and allow 72 hours.    Due to Covid, you will  need to wear a mask upon entering the hospital. If you do not have a mask, a mask will be given to you at the Main Entrance upon arrival. For doctor visits, patients may have 1 support person age 14 or older with them. For treatment visits, patients can not have anyone with them due to social distancing guidelines and our immunocompromised population.

## 2023-06-05 ENCOUNTER — Ambulatory Visit (HOSPITAL_COMMUNITY)
Admission: RE | Admit: 2023-06-05 | Discharge: 2023-06-05 | Disposition: A | Discharge status: Home / Self Care | Payer: Medicare PPO | Source: Ambulatory Visit | Attending: Family Medicine | Admitting: Family Medicine

## 2023-06-05 ENCOUNTER — Encounter (HOSPITAL_COMMUNITY): Payer: Self-pay

## 2023-06-05 ENCOUNTER — Other Ambulatory Visit (HOSPITAL_COMMUNITY): Payer: Medicare PPO

## 2023-06-05 VITALS — BP 128/56 | HR 88 | Wt 116.4 lb

## 2023-06-05 DIAGNOSIS — Z9641 Presence of insulin pump (external) (internal): Secondary | ICD-10-CM | POA: Diagnosis not present

## 2023-06-05 DIAGNOSIS — Z952 Presence of prosthetic heart valve: Secondary | ICD-10-CM | POA: Insufficient documentation

## 2023-06-05 DIAGNOSIS — K7460 Unspecified cirrhosis of liver: Secondary | ICD-10-CM | POA: Diagnosis not present

## 2023-06-05 DIAGNOSIS — R0989 Other specified symptoms and signs involving the circulatory and respiratory systems: Secondary | ICD-10-CM

## 2023-06-05 DIAGNOSIS — N186 End stage renal disease: Secondary | ICD-10-CM | POA: Insufficient documentation

## 2023-06-05 DIAGNOSIS — I425 Other restrictive cardiomyopathy: Secondary | ICD-10-CM | POA: Diagnosis not present

## 2023-06-05 DIAGNOSIS — I440 Atrioventricular block, first degree: Secondary | ICD-10-CM | POA: Insufficient documentation

## 2023-06-05 DIAGNOSIS — I480 Paroxysmal atrial fibrillation: Secondary | ICD-10-CM | POA: Insufficient documentation

## 2023-06-05 DIAGNOSIS — D472 Monoclonal gammopathy: Secondary | ICD-10-CM | POA: Diagnosis not present

## 2023-06-05 DIAGNOSIS — I050 Rheumatic mitral stenosis: Secondary | ICD-10-CM

## 2023-06-05 DIAGNOSIS — Z951 Presence of aortocoronary bypass graft: Secondary | ICD-10-CM | POA: Insufficient documentation

## 2023-06-05 DIAGNOSIS — I25110 Atherosclerotic heart disease of native coronary artery with unstable angina pectoris: Secondary | ICD-10-CM

## 2023-06-05 DIAGNOSIS — D500 Iron deficiency anemia secondary to blood loss (chronic): Secondary | ICD-10-CM | POA: Diagnosis not present

## 2023-06-05 DIAGNOSIS — I2510 Atherosclerotic heart disease of native coronary artery without angina pectoris: Secondary | ICD-10-CM | POA: Insufficient documentation

## 2023-06-05 DIAGNOSIS — K7581 Nonalcoholic steatohepatitis (NASH): Secondary | ICD-10-CM | POA: Diagnosis not present

## 2023-06-05 DIAGNOSIS — D696 Thrombocytopenia, unspecified: Secondary | ICD-10-CM | POA: Diagnosis not present

## 2023-06-05 DIAGNOSIS — D649 Anemia, unspecified: Secondary | ICD-10-CM | POA: Diagnosis not present

## 2023-06-05 DIAGNOSIS — I495 Sick sinus syndrome: Secondary | ICD-10-CM | POA: Insufficient documentation

## 2023-06-05 DIAGNOSIS — Z9013 Acquired absence of bilateral breasts and nipples: Secondary | ICD-10-CM | POA: Insufficient documentation

## 2023-06-05 DIAGNOSIS — I5032 Chronic diastolic (congestive) heart failure: Secondary | ICD-10-CM | POA: Diagnosis not present

## 2023-06-05 DIAGNOSIS — I132 Hypertensive heart and chronic kidney disease with heart failure and with stage 5 chronic kidney disease, or end stage renal disease: Secondary | ICD-10-CM | POA: Diagnosis not present

## 2023-06-05 DIAGNOSIS — Z7902 Long term (current) use of antithrombotics/antiplatelets: Secondary | ICD-10-CM | POA: Insufficient documentation

## 2023-06-05 DIAGNOSIS — I342 Nonrheumatic mitral (valve) stenosis: Secondary | ICD-10-CM | POA: Insufficient documentation

## 2023-06-05 DIAGNOSIS — Z950 Presence of cardiac pacemaker: Secondary | ICD-10-CM | POA: Insufficient documentation

## 2023-06-05 DIAGNOSIS — Z8673 Personal history of transient ischemic attack (TIA), and cerebral infarction without residual deficits: Secondary | ICD-10-CM

## 2023-06-05 DIAGNOSIS — G4733 Obstructive sleep apnea (adult) (pediatric): Secondary | ICD-10-CM | POA: Diagnosis not present

## 2023-06-05 DIAGNOSIS — R9431 Abnormal electrocardiogram [ECG] [EKG]: Secondary | ICD-10-CM | POA: Diagnosis not present

## 2023-06-05 DIAGNOSIS — Z992 Dependence on renal dialysis: Secondary | ICD-10-CM | POA: Diagnosis not present

## 2023-06-05 DIAGNOSIS — E785 Hyperlipidemia, unspecified: Secondary | ICD-10-CM | POA: Insufficient documentation

## 2023-06-05 DIAGNOSIS — E1022 Type 1 diabetes mellitus with diabetic chronic kidney disease: Secondary | ICD-10-CM | POA: Insufficient documentation

## 2023-06-05 DIAGNOSIS — I10 Essential (primary) hypertension: Secondary | ICD-10-CM

## 2023-06-05 NOTE — Progress Notes (Signed)
Date:  06/05/2023   ID:  [name] [name], DOB 10-20-1950, MRN [id number]   Provider location: [address] [place] Type of Visit: Established patient  PCP:  Aliene Beams, MD  Nephrologist: Dr. Signe Colt Primary HF: Dr. Shirlee Latch   History of Present Illness: [name] [name] is a 72 y.o. female who has a history of CAD s/p CABG in 12/05 and redo in 1/11, bioprosthetic AVR, diastolic CHF, and sick sinus syndrome s/p PPM presents for cardiology followup.  She had initial CABG with SVG-RCA in 12/05 followed by redo SVG-RCA and bioprosthetic AVR in 1/11.  She has had diastolic CHF and is on Lasix.  In 9/14, she had a CPX test.  This showed severe functional limitation with ischemic ECG changes (inferolateral ST depression and ST elevation in V1 and V2). She had chest pain.  She had R/LHC in 9/14.  This showed elevated left and right heart filling pressures, patent SVG-PDA, and 80% distal LM/80% ostial LAD stenosis.  She was not a candidate for redo CABG (had 2 prior sternotomies as well as chest radiation for Hodgkins lymphoma).  She had DES from left main into proximal LAD and will need long-term DAPT => Brilinta was used as she has been a poor responder to Plavix in the past.  She re-developed angina in 11/15 and was admitted with unstable angina.  LHC showed 80% ostial LM and patent SVG-RCA. She had DES to ostial LM. Echo 1/18 showed EF 60-65%, normal bioprosthetic aortic valve, mild-moderate MR, PASP 35 mmHg.    She has chronic iron deficiency anemia thought to be due to a slow GI bleed.  Last EGD was unremarkable and a capsule endoscopy was also unremarkable.  She has had periodic transfusions.     She had bilateral mastectomy for DCIS in 10/16 without complication.   She has an insulin pump followed by an endocrinologist at [facility].    Given myalgias with statins, she was started on Praluent.  LDL is now excellent.     In the past she has had short runs of atrial fibrillation.   She is not anticoagulated. Planned for Watchman placement, but given her nickel allergy, we were unable to place Watchman.    Diagnosed with OSA, now on CPAP.    Given ongoing problems with exertional dyspnea and occasional chest pain, Cardiolite was repeated in 9/18 and showed inferior changes.  She underwent left/right heart cath in 9/18.  This showed elevated filling pressures, R>L.  There was 75+% ostial LCx stenosis.  However, there were 2 layers of stent from the mid left main into the ostial LAD, and LCx intervention was thought to be very risky.  It was decided to manage her medically.  Echo was repeated in 10/18 showing EF 55-60%, moderate to severe TR.     Given recurrent chest pain, she had ETT-Cardiolite done in 3/19.  This was a normal study with no evidence for ischemia or infarction.  Echo in 1/20 showed EF 55-60%, mild LVH, stable bioprosthetic aortic valve, moderate-severe TR.    Due to increased dyspnea and worsening renal function, she was set up for echo and RHC.  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  RHC in 8/20 showed primarily right-sided HF.  Cardiolite in 8/20 showed no ischemia or infarction.  Torsemide was increased and she cut back on sodium in her diet.    CPX in 11/20 showed severe functional limitation from HF.   She saw Dr. Edwena Blow at [facility] for evaluation for heart/kidney transplant (for restrictive cardiomyopathy).  She was determined to be a poor surgical candidate due to extensive vascular calcifications.   CT chest in 8/21 showed a loculated right pleural effusion but unable to find enough fluid by ultrasound for thoracentesis.   Echo in 9/21 showed EF 60-65% with mild LVH, mild RV dilation with mildly decreased systolic function  and a D-shaped septum, PASP 40, normal bioprosthetic aortic valve, rheumatic-appearing MV with mild-moderate mitral stenosis (mean gradient 9 but MVA by PHT 2.7 cm^2), trivial MR.    Echo 2/23, EF 60-65%, mild LVH, normal RV systolic function but with D-shaped septum, normal bioprosthetic aortic valve, mild mitral stenosis (rheumatic-appearing valve, mean gradient 4 mmHg), mild MR.   Seen by hematology 10/23. Labs showed mild hypercalcemia. Calcium level 10.4 with albumin 4.1. SPEP showed M spike.  Kappa/lambda light chain ratio 2.17. Immunofixation showed polyclonal gammopathy. Plan is to return in 6 months to repeat myeloma labs. She has not had a cMRI due to PPM.   Echo at [facility] in 12/23 showed LVEF 55%, AoV bioprosthesis functioning normal. RV systolic function reported to be normal. Moderate TR noted. No mention of mitral stenosis.   Patient started HD in 10/24. Atrial fibrillation noted during her first HD session, only lasted a few minutes.   Cardiac PET in 10/24 showed EF 68% rest/55% stress; small mid-apical inferolateral partially reversible defect (infarct with peri-infarct ischemia), decreased myocardial blood flow reserve, +TID. Considered high risk.   Follow up 10/24, having increased palpitations and was restarted on low dose Toprol. 1 week Zio placed to quantify AF burden, which showed mostly NSR, possible AF/AFL lasting 2 hours, minimal PVCs, no significant pauses or bradycardia.  Today she returns for HF follow up with her husband. Overall feeling fair. Struggling with dialysis and feeling poorly afterward, she is hopeful she can do home dialysis. Noticed about 10 hr episode of AF recently on her Apple Watch, feels "crummy and breathless." HR was in 90's. Having dizzy spells. Breathing is up and down. Continues with stable CP pattern, some weeks using NTG 4-5x/week, and other weeks none at all. Denies edema, or PND/Orthopnea. Wears ASV. Appetite ok. No fever or chills. Dry weight is  52.3 kg. Taking all medications. Follows at [facility], UF goal 1400-1700 ml/day.   ECG (personally reviewed): NSR 1AVB, 86 bpm, + LVH  Labs @ [facility] (3/19): K 4.7, creatinine 1.4, ANA negative, RF negative Labs (3/20): K 4, creatinine 1.8 Labs (6/20): K 4.7, creatinine 1.47 Labs (8/20): creatinine 2.13 => 2.46 => 2.4, LDL 22 Labs (9/20): K 4.8, creatinine 2.74, AST/ALT normal, hgb 12.6 Labs (1/21): K 4.1, creatinine 1.86, hgb 11.7 Labs (4/21): K 4.7, creatinine 1.7 Labs (6/21): K 4.2, creatinine 2 => 1.7 Labs (8/21): K 3.8, creatinine 1.69, hgb 10.6, plts 116 Labs (9/21): LDL 34, TGs 209 Labs (1/22): K 3.8, creatinine 2.0 Labs (3/22): LDL 25 Labs (4/22): K 4.7, creatinine 2.0, hgb 12.3 Labs (5/22): K 4.4, creatinine 2.21 Labs (8/22): LDL 1, TGs 330 Labs (10/22): K 4.6, creatinine 1.87 Labs (11/22): LDL 26 Labs (12/22): K 4.1, creatinine 2.25 Labs (1/23): creatinine 2.65 Labs (3/23): K 4.6, creatinine 2.8 Labs (4/23): K 4.1, creatinine 2.16, BNP 94 Labs (10/23): K 4.2, creatinine 1.62, calcium 10.4  Labs (12/23): K 4.1, creatinine 2.23 Labs (4/24):  K 4.6, SCr 2.6, hgb 12.5 Labs (5/24): K 4.4, creatinine 2.85 Labs (6/24): LDL 24, BNP 118 Labs (9/24): K 4.7, creatinine 2.5 Labs (11/24): K 3.7, creatinine 3.52, hgn 9.8   PMH: 1. Sick sinus syndrome s/p [employer] PCM.  2. HTN 3. H/o SBO 4. Renal cell carcinoma s/p right nephrectomy in 2008.  5. 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  Fibroscan in 11/14 showed advanced fibrosis concerning for cirrhosis. EGD in 11/14 showed no varices.  6. Diastolic CHF: Echo (4/14) with EF 60-65%, bioprosthetic aortic valve with mean gradient 15 mmHg, mild MR.  CPX (9/14): peak VO2 10.4, VE/VCO2 43.8, inferolateral ST depression and ST elevation in V1/V2 with severe dyspnea and chest pain, severe functional limitation with ischemic changes.  Echo (9/14) with EF 60-65%, normal RV size and systolic function, mild-moderate MR, bioprosthetic aortic valve normal,  PA systolic pressure 51 mmHg, no evidence for pericardial constriction though exam incomplete for constriction.  Echo (11/15) with EF 55-60%, bioprosthetic aortic valve, mild-moderate MR, moderate TR, PA systolic pressure 42 mmHg.  - TEE (5/17) with EF 60-65%, normal bioprosthetic aortic valve with mean gradient 13 mmHg, normal RV size and systolic function. - Echo (1/18) with EF 60-65%, normal bioprosthetic aortic valve, mild-moderate MR, PASP 35 mmHg.  - Echo (10/18) with EF 55-60%, bioprosthetic aortic valve mean gradient 10 mmHg, MAC with mild mitral stenosis (mean gradient 7) and mild mitral regurgitation, moderate to severe TR, PASP 43 mmHg.  - RHC (9/18): mean RA 13, PA 44/18 mean 33, mean PCWP 19, CI 3.45, PVR 2.7 WU.  - Echo (1/20): EF 55-60%, mild LVH, mild MR, stable bioprosthetic aortic valve, moderate-severe TR.  - RHC (8/20): mean RA 14, PA 43/15 mean 28, mean PCWP 13, PAPI 2, PVR 4.2, CI 2.31 - Echo (8/20): EF 55-60%, RV mildly dilated with normal systolic function, mildly D-shaped septum suggestive of RV pressure/volume overload, rheumatic-appearing mitral valve with mild mitral stenosis (mean gradient 6, MVA by PHT 4 cm^2), no evidence for pericardial constriction, moderate TR, bioprosthetic aortic valve functioning normally with mean gradient 6 mmHg.  - CPX (11/20): peak VO2 9.8, VE/VCO2 slope 38, RER 1.11 => severe functional limitation from HF.   - Echo (9/21): EF 60-65% with mild LVH, mild RV dilation with mildly decreased systolic function and a D-shaped septum, PASP 40, normal bioprosthetic aortic valve, rheumatic-appearing MV with mild-moderate mitral stenosis (mean gradient 9 but MVA by PHT 2.7 cm^2, trivial MR.  - Echo (2/23): EF 60-65%, mild LVH, normal RV systolic function but with D-shaped septum, normal bioprosthetic aortic valve, mild mitral stenosis (rheumatic-appearing valve, mean gradient 4 mmHg), mild MR.  - RHC 1/24: Elevated right heart filling pressures out of  proportion to PCWP suggesting primarily RV failure. Mild PAH. Restrictive filling picture. Low cardiac index at 1.71. (RA 13, PA 43/15, mean 25, PCWP 12 with v waves to 20, CO 2.54, CI 1.71) - Echo (12/23, [facility]): LVEF 55%, AoV bioprosthesis functioning normal. RV systolic function reported to be normal. Moderate TR noted. No mention of mitral stenosis.  7. TAH-BSO 8. Type I diabetes: Has insulin pump.  9. H/o ischemic colitis. 10. H/o PUD. 11. Diabetic gastroparesis. 12. Hyperlipidemia: Myalgias with Crestor and atorvastatin, elevated LFTs with simvastatin.  Myalgias with > 10 mg pravastatin. Now on Praluent.  13. ESRD 14. Bicuspid aortic valve s/p 19 mm Edwards pericardial valve in 1/11 (had post-operative Dresslers syndrome).   15. CAD: CABG with SVG-RCA in 12/05.  Redo SVG-RCA in 1/11 with AVR.  LHC/RHC (9/14) with mean RA 18, PA 62/25 mean 43, mean PCWP 26, CI 2.8, 70-80% distal LM stenosis, 80% ostial LAD stenosis, total occlusion RCA, SVG-PDA patent.  Patient had DES LM into LAD.  Plan to continue long-term ASA/Brilinta (poor responder to Plavix).  ETT-cardiolite (4/15) with 6' exercise, EF 60%, ST depression in recovery and mild chest pain, no ischemia or infarction by perfusion images.  Unstable angina 11/15 with 80% LM, patent SVG-RCA => DES to ostial LM.   - Lexiscan Cardiolite (2/18): EF 62%, no ischemia or infarction (normal).  She had a significant reaction to [place] and probably should not get again.  - Cardiolite (9/18): EF 72%, prior inferior infarct with mild peri-infarct ischemia.  - LHC (9/18): 2 layers stent left main - ostial LAD with 50% dLM in-stent restenosis, 75+% ostial LCx, totally occluded RCA, SVG-RCA patent.  Medical management planned.  - ETT-Cardiolite (4/19): EF normal, 7'11" exercise, no ischemia or infarction.  - Lexiscan Cardiolite (8/20): No ischemia/infarction.  - Cardiac PET in 10/24 showed EF 68% rest/55% stress; small mid-apical inferolateral partially  reversible defect (infarct with peri-infarct ischemia), decreased myocardial blood flow reserve, +TID. Considered high risk.  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  17. Anemia: Iron-deficiency.  Suspect chronic GI blood loss.  EGD in 9/14 was unremarkable.  Capsule endoscopy 10/14 was unremarkable.  Has required periodic blood transfusions.  18. Atrial fibrillation: Paroxysmal, noted by [facility] interrogation.  - Zio monitor 3/23: < 1% atrial fibrillation, longest run was 18 minutes.  - Zio monitor 11/24: < 1% atrial fibrillation/flutter burden, longest run 2 hours 19. Carotid stenosis: Carotid dopplers (2/15) with 40-59% bilateral stenosis. Carotid dopplers (3/16) with 40-59% bilateral ICA stenosis. Carotid dopplers (3/17) with 40-59% bilateral ICA stenosis.  - Carotid dopplers (3/18) with mild BICA stenosis.  20. 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  22. C difficile colitis 2017 23. Gout 24. ?TIA: head CT negative.  25. OSA: Moderate, uses CPAP.  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 Hypercalcemia 28. 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  Mild-moderate on 9/21 echo.  Mild on 2/23 echo.  29. Tricuspid regurgitation: Moderate by 8/20 echo.  30. Lung nodules: Followed at [facility] => stable.  31. Essential tremor  Current Outpatient Medications  Medication Sig Dispense Refill   acyclovir ointment (ZOVIRAX) 5 % Apply 1 application topically every 3 (three) hours as needed (fever blister/cold sores).     ALPRAZolam (XANAX) 0.5 MG tablet Take 1 tablet (0.5 mg total) by mouth 2 (two) times daily as needed for anxiety. 60 tablet 1   APIDRA 100 UNIT/ML injection Inject 40 Units into the skin as directed. INFUSE THROUGH INSULIN PUMP UTD, Bolus depends on carb intake  2   aspirin EC 81 MG tablet Take 81 mg by mouth  at bedtime.     BRILINTA 60 MG TABS tablet TAKE ONE TABLET (60MG  TOTAL) BY MOUTH TWICE DAILY 180 tablet 3   cetirizine (ZYRTEC) 10 MG tablet Take 10 mg by mouth in the morning.     clobetasol (OLUX) 0.05 % topical foam Apply 1 application  topically 2 (two) times daily as needed (skin flaking (legs)).     Coenzyme Q10 (CO Q 10) 100 MG CAPS Take 100 mg by mouth at bedtime.      colchicine 0.6 MG tablet Take 0.6 mg by mouth daily as needed (gout  flare).     denosumab (PROLIA) 60 MG/ML SOSY injection Inject 60 mg into the skin every 6 (six) months.     diclofenac Sodium (VOLTAREN) 1 % GEL Apply 2 g topically 4 (four) times daily as needed (pain.).     escitalopram (LEXAPRO) 5 MG tablet Take 5 mg by mouth at bedtime.     famotidine (PEPCID) 40 MG tablet Take 40 mg by mouth at bedtime.     gabapentin (NEURONTIN) 100 MG capsule Take 100 mg by mouth daily.     glucagon (GLUCAGON EMERGENCY) 1 MG injection Inject 1 mg into the vein once as needed (low blood sugar).     isosorbide mononitrate (IMDUR) 120 MG 24 hr tablet Take 1 tablet (120 mg total) by mouth every morning.     latanoprost (XALATAN) 0.005 % ophthalmic solution Place 1 drop into both eyes at bedtime.     metolazone (ZAROXOLYN) 2.5 MG tablet Take 2.5 mg (1 tab) once as directed by CHF clinic. Call before taking [phone] 3 tablet 0   metoprolol succinate (TOPROL XL) 25 MG 24 hr tablet Take 0.5 tablets (12.5 mg total) by mouth daily. 45 tablet 3   midodrine (PROAMATINE) 5 MG tablet Take 1 tablet (5 mg total) by mouth daily as needed (Prior to HD). (Patient taking differently: Take 10 mg by mouth daily as needed (Prior to HD).) 30 tablet 3   mometasone (NASONEX) 50 MCG/ACT nasal spray Place 1 spray into the nose daily as needed (allergies).     mupirocin cream (BACTROBAN) 2 % Apply 1 application topically 2 (two) times daily as needed. 90 g 0   nitroGLYCERIN (NITROLINGUAL) 0.4 MG/SPRAY spray PLACE 1 SPRAY UNDER TH TONGUE EVERY 5 MINUTES FOR 3  DOSES AS NEEDED FOR CHEST PAIN 4.9 g 5   polyethylene glycol (MIRALAX / GLYCOLAX) 17 g packet Take 17 g by mouth in the morning. Hold for loose stools     pravastatin (PRAVACHOL) 10 MG tablet TAKE ONE TABLET (10 MG TOTAL) BY MOUTH DAILY. 90 tablet 3   Probiotic Product (ALIGN PO) Take 1 tablet by mouth daily in the afternoon.     REPATHA SURECLICK 140 MG/ML SOAJ INJECT 140MG  INTO THE SKIN EVERY 14 DAYS 6 mL 3   torsemide (DEMADEX) 20 MG tablet Take 40 mg Twice daily on Wed,sat and sun 64 tablet 3   ULORIC 40 MG tablet Take 1 tablet (40 mg total) by mouth at bedtime. 90 tablet 3   VASCEPA 1 g capsule TAKE TWO CAPSULES (2 GRAMS TOTAL) BY MOUTH TWO TIMES DAILY 360 capsule 3   No current facility-administered medications for this encounter.    Allergies:   Avandia [rosiglitazone maleate], Cephalexin, Clindamycin, Humalog [insulin lispro], Lincomycin, Metformin, Novolog [insulin aspart], Penicillins, Tizanidine, Adhesive [tape], Atorvastatin, Cholestyramine, Gentamycin [gentamicin], Iodinated contrast media, Limonene, Prednisone, Ranexa [ranolazine], Rosuvastatin, Simvastatin, Zinc, Acetaminophen, Azelastine, Clopidogrel, Kerendia [finerenone], Lisinopril, Neosporin [bacitracin-polymyxin b], Codeine, Erythromycin, Hydrocodone-acetaminophen, Latex, Neomycin, Nickel, Ranitidine, Tamiflu [oseltamivir], and Tramadol   Social History:  The patient  reports that she has never smoked. She has never used smokeless tobacco. She reports that she does not drink alcohol and does not use drugs.   Family History:  The patient's family history includes Anal fissures in her mother; Breast cancer (age of onset: 49) in her sister; Cancer (age of onset: 74) in her sister; Cancer (age of onset: 18) in her maternal grandfather; Diabetes in her father; Glaucoma in her brother; Heart attack in her father, maternal grandmother, and mother;  Heart disease in her father and mother; Hypertension in her brother; Lung cancer in her  sister; Parkinson's disease in her paternal aunt.   ROS:  Please see the history of present illness.   All other systems are personally reviewed and negative.   BP (!) 128/56   Pulse 88   Wt 52.8 kg (116 lb 6.4 oz)   SpO2 95%   BMI 21.29 kg/m   Wt Readings from Last 3 Encounters:  06/05/23 52.8 kg (116 lb 6.4 oz)  06/04/23 52.2 kg (115 lb)  05/22/23 53.5 kg (118 lb)   PHYSICAL EXAM: General:  NAD. No resp difficulty, walked into clinic, frail-appearing HEENT: Normal Neck: Supple. No JVD. Carotids 2+ bilat; no bruits. No lymphadenopathy or thryomegaly appreciated. Cor: PMI nondisplaced. Regular rate & rhythm. No rubs, gallops, 2/6 SEM RUSB Lungs: Clear Abdomen: Soft, nontender, nondistended. No hepatosplenomegaly. No bruits or masses. Good bowel sounds. Extremities: No cyanosis, clubbing, rash, edema Neuro: Alert & oriented x 3, cranial nerves grossly intact. Moves all 4 extremities w/o difficulty. Affect pleasant.  ASSESSMENT AND PLAN: 1. Chronic diastolic CHF: Restrictive cardiomyopathy.  Most recent echo in 2/23 shows normal LV systolic function, normal RV systolic function but D-shaped septum suggesting RV pressure/volume overload, mild MS, normal bioprosthetic AoV, no evidence for pericardial constriction. RHC in 8/20 was concerning for right > left heart failure, she did appear to have equalization of diastolic pressures concerning for restrictive cardiomyopathy, possibly related to prior radiation. CPX in 11/20 showed severe HF limitation.  She was evaluated at [facility] for heart/kidney transplant for CKD and restrictive cardiomyopathy, but extensive vascular calcification makes her a poor surgical candidate. Balance of renal failure and RV failure has been difficult. RHC 1/24 showed elevated right heart filling pressures out of proportion to PCWP suggesting primarily RV failure. Mild PAH. Restrictive filling picture. Low cardiac index at 1.71 (RA 13, PA 43/15, mean 25, PCWP 12 with v  waves to 20, CO 2.54, CI 1.71). Echo done at [facility] 12/23 showed LVEF 55%, AoV bioprosthesis functioning normal. RV systolic function reported to be normal. Moderate TR noted, no mention of mitral stenosis. Initially, increased dyspnea after starting HD but this seems better now.  I suspect she is getting more ultrafiltration now that BP is running higher at HD with midodrine. She is not volume overloaded.  - Continue torsemide 40 mg bid on non-HD days. She still makes urine.  - Continue midodrine 10 mg pre-HD to allow more ultrafiltration.  - Repeat echo next visit. 2. CAD: s/p SVG-RCA in 12/05, then redo SVG-RCA in 1/11--> distal left main/proximal LAD severe stenosis in 9/14.  PCI with DES to distal LM/proximal LAD was done rather than bypass as she would have been a poor candidate for redo sternotomy (2 prior sternotomies and history of chest wall radiation for Hodgkins disease).  Recurrent unstable angina with 80% ostial LM treated with repeat DES in 11/15.  Lexiscan Cardiolite in 9/18 inferior changes.  Repeat LHC (9/18) showed 50% distal left main in-stent restenosis and 75+% ostial LCx stenosis. I reviewed the films at the time with interventional cardiology => would be very difficult to intervene on the ostial LCx through 2 overlapping stents from left main into the LAD.  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 Given no ACS, we planned medical management and increased Imdur.  Cardiolite in 3/19 and again in 8/20 showed no ischemia or infarction.  Cardiac PET in 10/24 showed EF 68% rest/55% stress; small  mid-apical inferolateral partially reversible defect (infarct with peri-infarct ischemia), decreased myocardial blood flow reserve, +TID. Considered high risk.  She is now having less chest pain, notes with heavy exertion, 2-3 NTG/week.  This seems to be improving as she is doing better with HD and BP is running higher on midodrine. The inferolateral partially reversible  defect likely corresponds to the LCx ostial disease noted on last cath, no good option for intervention.   - Unless symptoms significantly worsen, would hold off on repeat cath as cPET suggests inferolateral/LCx territory ischemia and no good interventional option for known LCx ostial disease.  - Midodrine as above to keep BP up with HD.  - Continue Imdur 120 mg daily.  - Continue Brilinta 60 mg bid (poor responder to Plavix) long-term given recurrent left main disease.  - Continue ASA 81, pravastin/Repatha/Vascepa, good lipids in 6/24.  - Continue low dose Toprol XL 12.5 mg (take after diaylsis on dialysis days) - She was unable to tolerate ranolazine.  3. Bioprosthetic aortic valve replacement: In setting of bicuspid aortic valve, thoracic aorta not dilated on 2017 TEE.  Valve looked ok on 2/23 echo.  Stable on Echo done at [facility] 12/23. - As above, repeat echo. 4. ESRD:  Patient has a single kidney s/p right nephrectomy and suspected diabetic nephropathy.  She has progressed to ESRD and is getting iHD.   - As above, continue midodrine to try to allow more fluid removal.  - Not candidate for PD given prior abdominal surgery.  - Would like to get her to home HD.    5. Hyperlipidemia: She has not been able to tolerate any higher potency statin than pravastatin 10 mg daily.   - Continue pravastatin.  - Continue Repatha.   - She is on Vascepa for elevated TGs.  6. Anemia: Chronic/slow GI bleed.  Unremarkable EGD and capsule endoscopy in fall 2014.  Unfortunately, needs to stay on Brilinta long-term (Plavix non-responder).   - Labs reviewed from nephrology (06/04/23), hgb 9.8, SCr 3.52, K 3.7 7. Atrial fibrillation: Paroxysmal, very brief episodes noted on PCM interrogation. Given GI bleeding and need to be on Brilinta, risks have been thought to outweigh benefits of anticoagulation. She is not a good Multaq candidate with CHF.  If she needs an antiarrhythmic, consider Tikosyn or Sotalol but renal  function may make these meds difficult to use.  Zio monitor in 3/23 showed < 1% AF, longest run 18 minutes. Atrial fibrillation noted recently with HD, but episodes still appear to be very short. She is in NSR today.  - Recent Zio monitor 11/24 showed low AF burden (<1%, 2 hours). Defer to EP regarding anticoagulation, but do not think she will be able to tolerate. Discussed with Dr. Shirlee Latch - We were unable to place Watchman device due to nickel allergy.   8. Carotid bruit: Only mild stenosis on last dopplers in 3/18.  9. SSS: Pacemaker in place.  10. HTN: BP controlled.    11. OSA: Continue ASV.  12. ?TIA: Transient dysarthria in 3/18, could have been afib-related TIA. No recurrent symptoms.  13. Thrombocytopenia: Chronic and mild. Follows with hematology.  14. 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  Mild on 2/23 echo.  She would not be a candidate for valve replacement.  - Repeat echo as above.  15. Hematology: MGUS noted, has seen hematology.  Low suspicion for cardiac amyloidosis  16. 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  EGD 07/26/22 negative for varices  Follow up next  month with Dr. Shirlee Latch + echo, as scheduled.  Signed, Jacklynn Ganong, FNP  06/05/2023  [facility] [address] Heart and Vascular [place] [address] [phone] (office) [phone] (fax)

## 2023-06-05 NOTE — Patient Instructions (Addendum)
Thank you for coming in today  EKG was done today   Medications: No changes    Follow up appointments:  Your physician recommends that you schedule a follow-up appointment in:  Keep follow up with Dr. Shirlee Latch with echocardiogram 07/12/2023   Do the following things EVERYDAY: Weigh yourself in the morning before breakfast. Write it down and keep it in a log. Take your medicines as prescribed Eat low salt foods--Limit salt (sodium) to 2000 mg per day.  Stay as active as you can everyday Limit all fluids for the day to less than 2 liters   At the [facility], you and your health needs are our priority. As part of our continuing mission to provide you with exceptional heart care, we have created designated Provider Care Teams. These Care Teams include your primary Cardiologist (physician) and Advanced Practice Providers (APPs- Physician Assistants and Nurse Practitioners) who all work together to provide you with the care you need, when you need it.   You may see any of the following providers on your designated Care Team at your next follow up: Dr Arvilla Meres Dr Marca Ancona Dr. Marcos Eke, NP Robbie Lis, [place] [facility] [place], [place] Brynda Peon, NP Karle Plumber, PharmD   Please be sure to bring in all your medications bottles to every appointment.    Thank you for choosing [facility]  If you have any questions or concerns before your next appointment please send [name] a message through [place] or call our office at [phone].    TO LEAVE A MESSAGE FOR THE NURSE SELECT OPTION 2, PLEASE LEAVE A MESSAGE INCLUDING: YOUR NAME DATE OF BIRTH CALL BACK NUMBER REASON FOR CALL**this is important as we prioritize the call backs  YOU WILL RECEIVE A CALL BACK THE SAME DAY AS LONG AS YOU CALL BEFORE 4:00 PM

## 2023-06-06 DIAGNOSIS — N186 End stage renal disease: Secondary | ICD-10-CM | POA: Diagnosis not present

## 2023-06-06 DIAGNOSIS — I5030 Unspecified diastolic (congestive) heart failure: Secondary | ICD-10-CM | POA: Diagnosis not present

## 2023-06-06 DIAGNOSIS — Z992 Dependence on renal dialysis: Secondary | ICD-10-CM | POA: Diagnosis not present

## 2023-06-07 DIAGNOSIS — Z992 Dependence on renal dialysis: Secondary | ICD-10-CM | POA: Diagnosis not present

## 2023-06-07 DIAGNOSIS — I5030 Unspecified diastolic (congestive) heart failure: Secondary | ICD-10-CM | POA: Diagnosis not present

## 2023-06-07 DIAGNOSIS — N186 End stage renal disease: Secondary | ICD-10-CM | POA: Diagnosis not present

## 2023-06-10 ENCOUNTER — Ambulatory Visit (HOSPITAL_COMMUNITY)
Admission: RE | Admit: 2023-06-10 | Discharge: 2023-06-10 | Disposition: A | Discharge status: Home / Self Care | Payer: Medicare PPO | Source: Ambulatory Visit | Attending: Surgery | Admitting: Surgery

## 2023-06-10 ENCOUNTER — Ambulatory Visit (INDEPENDENT_AMBULATORY_CARE_PROVIDER_SITE_OTHER): Payer: Medicare PPO | Admitting: Physician Assistant

## 2023-06-10 VITALS — BP 115/64 | HR 89 | Temp 98.1°F | Ht 62.0 in | Wt 116.4 lb

## 2023-06-10 DIAGNOSIS — N184 Chronic kidney disease, stage 4 (severe): Secondary | ICD-10-CM | POA: Diagnosis not present

## 2023-06-10 DIAGNOSIS — Z992 Dependence on renal dialysis: Secondary | ICD-10-CM | POA: Diagnosis not present

## 2023-06-10 DIAGNOSIS — I5030 Unspecified diastolic (congestive) heart failure: Secondary | ICD-10-CM | POA: Diagnosis not present

## 2023-06-10 DIAGNOSIS — N186 End stage renal disease: Secondary | ICD-10-CM

## 2023-06-11 DIAGNOSIS — N186 End stage renal disease: Secondary | ICD-10-CM | POA: Diagnosis not present

## 2023-06-11 DIAGNOSIS — D631 Anemia in chronic kidney disease: Secondary | ICD-10-CM | POA: Diagnosis not present

## 2023-06-11 DIAGNOSIS — B349 Viral infection, unspecified: Secondary | ICD-10-CM | POA: Diagnosis not present

## 2023-06-11 DIAGNOSIS — Z992 Dependence on renal dialysis: Secondary | ICD-10-CM | POA: Diagnosis not present

## 2023-06-11 DIAGNOSIS — Z952 Presence of prosthetic heart valve: Secondary | ICD-10-CM | POA: Diagnosis not present

## 2023-06-11 DIAGNOSIS — Z03818 Encounter for observation for suspected exposure to other biological agents ruled out: Secondary | ICD-10-CM | POA: Diagnosis not present

## 2023-06-11 DIAGNOSIS — N184 Chronic kidney disease, stage 4 (severe): Secondary | ICD-10-CM | POA: Diagnosis not present

## 2023-06-11 DIAGNOSIS — R059 Cough, unspecified: Secondary | ICD-10-CM | POA: Diagnosis not present

## 2023-06-11 DIAGNOSIS — I5030 Unspecified diastolic (congestive) heart failure: Secondary | ICD-10-CM | POA: Diagnosis not present

## 2023-06-12 NOTE — Progress Notes (Signed)
POST OPERATIVE OFFICE NOTE    CC:  F/u for surgery  HPI:  [name] [name] is a 72 y.o. female who is s/p right first stage basilic fistula creation and right IJ [facility] placement on 05/01/2023 by Dr. Myra Gianotti.  This was done for dialysis access with recent progression to ESRD.  She returns today for follow-up.  She denies any issues with her incision since surgery such as drainage, erythema, or tenderness.  She denies any symptoms of steal in the right hand such as excessive weakness, pain, or coldness.   She is fairly tired and getting used to dialysis, currently with 4 sessions a week.   Allergies  Allergen Reactions   Avandia [Rosiglitazone Maleate] Other (See Comments)    Congestive heart failure   Cephalexin Anaphylaxis, Swelling and Rash    Extremities swell , throat swelling    Clindamycin Anaphylaxis and Shortness Of Breath    Other reaction(s): esophageal spasms   Humalog [Insulin Lispro] Itching     big knots and whelp    Lincomycin Anaphylaxis and Shortness Of Breath   Metformin Other (See Comments)    Congestive heart failure   Novolog [Insulin Aspart (Human Analog)] Hives and Itching    Humalog & Novolog big knots and whelp     Penicillins Anaphylaxis, Hives, Swelling and Rash    Did it involve swelling of the face/tongue/throat, SOB, or low BP? Yes Did it involve sudden or severe rash/hives, skin peeling, or any reaction on the inside of your mouth or nose? No Did you need to seek medical attention at a hospital or doctor's office? Yes When did it last happen?      Childhood allergy  If all above answers are "NO", may proceed with cephalosporin use.    Tizanidine Other (See Comments)    Dizziness, Mental Status Changes, Hallucination     Adhesive [Tape] Other (See Comments)    Tears skin, Please use "paper" tape   Atorvastatin Other (See Comments)    increased LFT's, no muscle weakness, leg pain Muscle and joint pain increased LFT's  Increased LFTs    Cholestyramine Other (See Comments)    Liver Disorder Elevated LFTs     Gentamycin [Gentamicin] Hives, Itching and Rash    Fine red bumps.  Reaction noted post loop recorder implant, where gentamycin was used for irrigation. Topical prep   Iodinated Contrast Media Other (See Comments)    PAIN DURING LYMPHANGIOGRAM '81, NO ALLERGY TO IV CONTRAST.  Has had procedures with Iodine since without problems.    Limonene Hives, Itching, Rash and Other (See Comments)    Reaction noted post loop recorder implant, where gentamycin was used for irrigation. Topical prep   Prednisone Itching and Other (See Comments)    B/P went high, itching all over.  Tolerates with benadryl    Ranexa [Ranolazine] Other (See Comments)    Nausea, dizziness, low blood sugar, tingling in right hand, "blood blisters" on tongue    Rosuvastatin Nausea And Vomiting, Nausea Only and Other (See Comments)    Muscle and joint pain & increased LFTs; tolerates Pravastatin 10mg  (max) Other reaction(s): elevated LFTs   Simvastatin Other (See Comments)    Increased LFT's    Zinc     Other Reaction(s): Unknown   Acetaminophen     Pt told not to take tylenol due to liver issues   Azelastine Other (See Comments)    Other reaction(s): metallic taste,spitting up blood   Clopidogrel Other (See Comments)    Does  not work for patient Medicine was not effective Other reaction(s): Unknown   Kerendia [Finerenone] Hives and Nausea Only   Lisinopril Diarrhea, Nausea Only and Other (See Comments)   Neosporin [Bacitracin-Polymyxin B] Dermatitis   Codeine Nausea And Vomiting   Erythromycin Nausea And Vomiting   Hydrocodone-Acetaminophen Itching, Rash and Other (See Comments)    itching   Latex Itching and Other (See Comments)    I"if wear gloves hands get red ,itchy no prioblem if other wear and touch me"   Neomycin Rash and Other (See Comments)    Redness   Nickel Itching   Ranitidine Nausea Only   Tamiflu [Oseltamivir] Nausea And  Vomiting   Tramadol Nausea Only    Current Outpatient Medications  Medication Sig Dispense Refill   acyclovir ointment (ZOVIRAX) 5 % Apply 1 application topically every 3 (three) hours as needed (fever blister/cold sores).     ALPRAZolam (XANAX) 0.5 MG tablet Take 1 tablet (0.5 mg total) by mouth 2 (two) times daily as needed for anxiety. 60 tablet 1   APIDRA 100 UNIT/ML injection Inject 40 Units into the skin as directed. INFUSE THROUGH INSULIN PUMP UTD, Bolus depends on carb intake  2   aspirin EC 81 MG tablet Take 81 mg by mouth at bedtime.     BRILINTA 60 MG TABS tablet TAKE ONE TABLET (60MG  TOTAL) BY MOUTH TWICE DAILY 180 tablet 3   cetirizine (ZYRTEC) 10 MG tablet Take 10 mg by mouth in the morning.     clobetasol (OLUX) 0.05 % topical foam Apply 1 application  topically 2 (two) times daily as needed (skin flaking (legs)).     Coenzyme Q10 (CO Q 10) 100 MG CAPS Take 100 mg by mouth at bedtime.      colchicine 0.6 MG tablet Take 0.6 mg by mouth daily as needed (gout flare).     denosumab (PROLIA) 60 MG/ML SOSY injection Inject 60 mg into the skin every 6 (six) months.     diclofenac Sodium (VOLTAREN) 1 % GEL Apply 2 g topically 4 (four) times daily as needed (pain.).     escitalopram (LEXAPRO) 5 MG tablet Take 5 mg by mouth at bedtime.     famotidine (PEPCID) 40 MG tablet Take 40 mg by mouth at bedtime.     gabapentin (NEURONTIN) 100 MG capsule Take 100 mg by mouth daily.     glucagon (GLUCAGON EMERGENCY) 1 MG injection Inject 1 mg into the vein once as needed (low blood sugar).     isosorbide mononitrate (IMDUR) 120 MG 24 hr tablet Take 1 tablet (120 mg total) by mouth every morning.     latanoprost (XALATAN) 0.005 % ophthalmic solution Place 1 drop into both eyes at bedtime.     metolazone (ZAROXOLYN) 2.5 MG tablet Take 2.5 mg (1 tab) once as directed by CHF clinic. Call before taking [phone] 3 tablet 0   metoprolol succinate (TOPROL XL) 25 MG 24 hr tablet Take 0.5 tablets (12.5  mg total) by mouth daily. 45 tablet 3   midodrine (PROAMATINE) 5 MG tablet Take 1 tablet (5 mg total) by mouth daily as needed (Prior to HD). (Patient taking differently: Take 10 mg by mouth daily as needed (Prior to HD).) 30 tablet 3   mometasone (NASONEX) 50 MCG/ACT nasal spray Place 1 spray into the nose daily as needed (allergies).     mupirocin cream (BACTROBAN) 2 % Apply 1 application topically 2 (two) times daily as needed. 90 g 0   nitroGLYCERIN (NITROLINGUAL)  0.4 MG/SPRAY spray PLACE 1 SPRAY UNDER TH TONGUE EVERY 5 MINUTES FOR 3 DOSES AS NEEDED FOR CHEST PAIN 4.9 g 5   polyethylene glycol (MIRALAX / GLYCOLAX) 17 g packet Take 17 g by mouth in the morning. Hold for loose stools     pravastatin (PRAVACHOL) 10 MG tablet TAKE ONE TABLET (10 MG TOTAL) BY MOUTH DAILY. 90 tablet 3   Probiotic Product (ALIGN PO) Take 1 tablet by mouth daily in the afternoon.     REPATHA SURECLICK 140 MG/ML SOAJ INJECT 140MG  INTO THE SKIN EVERY 14 DAYS 6 mL 3   torsemide (DEMADEX) 20 MG tablet Take 40 mg Twice daily on Wed,sat and sun 64 tablet 3   ULORIC 40 MG tablet Take 1 tablet (40 mg total) by mouth at bedtime. 90 tablet 3   VASCEPA 1 g capsule TAKE TWO CAPSULES (2 GRAMS TOTAL) BY MOUTH TWO TIMES DAILY 360 capsule 3   No current facility-administered medications for this visit.     ROS:  See HPI  Physical Exam:  Incision: Right upper arm incision well-healed without erythema, drainage, or hematoma Extremities: Palpable right radial pulse.  Right upper arm fistula with good thrill palpation throughout the upper arm Neuro: Intact motor and sensation of RUE  Studies: Dialysis Duplex (06/10/2023) +--------------------+----------+-----------------+--------+  AVF                PSV (cm/s)Flow Vol (mL/min)Comments  +--------------------+----------+-----------------+--------+  Native artery inflow   285           371                 +--------------------+----------+-----------------+--------+   AVF Anastomosis        615                               +--------------------+----------+-----------------+--------+     +------------+----------+-------------+----------+--------+  OUTFLOW VEINPSV (cm/s)Diameter (cm)Depth (cm)Describe  +------------+----------+-------------+----------+--------+  Prox UA        105        0.63        0.92             +------------+----------+-------------+----------+--------+  Mid UA          95        0.60        0.46             +------------+----------+-------------+----------+--------+  Dist UA        293        0.44        0.45             +------------+----------+-------------+----------+--------+  AC Fossa       593        0.28        0.51               Assessment/Plan:  This is a 72 y.o. female who is here for a post op visit  -The patient has been healing well since surgery.  Her right upper arm incision has no signs of infection, hematoma, or dehiscence -She denies any symptoms of steal such as right hand weakness, coldness, or pain.  She has a palpable right radial pulse -Duplex demonstrates a slow to mature right brachiobasilic fistula with flow volume of 371 mL/min.  The fistula is slow to mature in size in the distal upper arm with diameters less than 6 mm -On exam her fistula has a  great thrill throughout the upper arm.  I have explained to the patient we should give her more time to see if her fistula will fully mature prior to second stage surgery -I have encouraged the patient to continue using her [facility] for dialysis.  She should exercise her right arm in hopes to increase fistula maturity.  She can follow-up with our office in 4 to 5 weeks with repeat dialysis duplex   Loel Dubonnet, PA-C [facility] [phone]   Clinic MD:  Myra Gianotti

## 2023-06-13 DIAGNOSIS — Z992 Dependence on renal dialysis: Secondary | ICD-10-CM | POA: Diagnosis not present

## 2023-06-13 DIAGNOSIS — I5030 Unspecified diastolic (congestive) heart failure: Secondary | ICD-10-CM | POA: Diagnosis not present

## 2023-06-13 DIAGNOSIS — N186 End stage renal disease: Secondary | ICD-10-CM | POA: Diagnosis not present

## 2023-06-14 DIAGNOSIS — Z992 Dependence on renal dialysis: Secondary | ICD-10-CM | POA: Diagnosis not present

## 2023-06-14 DIAGNOSIS — N186 End stage renal disease: Secondary | ICD-10-CM | POA: Diagnosis not present

## 2023-06-14 DIAGNOSIS — I5030 Unspecified diastolic (congestive) heart failure: Secondary | ICD-10-CM | POA: Diagnosis not present

## 2023-06-16 DIAGNOSIS — N186 End stage renal disease: Secondary | ICD-10-CM | POA: Diagnosis not present

## 2023-06-16 DIAGNOSIS — Z992 Dependence on renal dialysis: Secondary | ICD-10-CM | POA: Diagnosis not present

## 2023-06-16 DIAGNOSIS — I5030 Unspecified diastolic (congestive) heart failure: Secondary | ICD-10-CM | POA: Diagnosis not present

## 2023-06-17 ENCOUNTER — Other Ambulatory Visit: Payer: Self-pay

## 2023-06-17 DIAGNOSIS — Z992 Dependence on renal dialysis: Secondary | ICD-10-CM | POA: Diagnosis not present

## 2023-06-17 DIAGNOSIS — I5030 Unspecified diastolic (congestive) heart failure: Secondary | ICD-10-CM | POA: Diagnosis not present

## 2023-06-17 DIAGNOSIS — N186 End stage renal disease: Secondary | ICD-10-CM | POA: Diagnosis not present

## 2023-06-18 DIAGNOSIS — N186 End stage renal disease: Secondary | ICD-10-CM | POA: Diagnosis not present

## 2023-06-18 DIAGNOSIS — Z992 Dependence on renal dialysis: Secondary | ICD-10-CM | POA: Diagnosis not present

## 2023-06-18 DIAGNOSIS — I5030 Unspecified diastolic (congestive) heart failure: Secondary | ICD-10-CM | POA: Diagnosis not present

## 2023-06-21 DIAGNOSIS — N186 End stage renal disease: Secondary | ICD-10-CM | POA: Diagnosis not present

## 2023-06-21 DIAGNOSIS — Z992 Dependence on renal dialysis: Secondary | ICD-10-CM | POA: Diagnosis not present

## 2023-06-21 DIAGNOSIS — I5030 Unspecified diastolic (congestive) heart failure: Secondary | ICD-10-CM | POA: Diagnosis not present

## 2023-06-22 DIAGNOSIS — I509 Heart failure, unspecified: Secondary | ICD-10-CM | POA: Diagnosis not present

## 2023-06-22 DIAGNOSIS — N186 End stage renal disease: Secondary | ICD-10-CM | POA: Diagnosis not present

## 2023-06-22 DIAGNOSIS — Z992 Dependence on renal dialysis: Secondary | ICD-10-CM | POA: Diagnosis not present

## 2023-06-24 DIAGNOSIS — E1022 Type 1 diabetes mellitus with diabetic chronic kidney disease: Secondary | ICD-10-CM | POA: Diagnosis not present

## 2023-06-24 DIAGNOSIS — N2581 Secondary hyperparathyroidism of renal origin: Secondary | ICD-10-CM | POA: Diagnosis not present

## 2023-06-24 DIAGNOSIS — N186 End stage renal disease: Secondary | ICD-10-CM | POA: Diagnosis not present

## 2023-06-24 DIAGNOSIS — Z992 Dependence on renal dialysis: Secondary | ICD-10-CM | POA: Diagnosis not present

## 2023-06-24 DIAGNOSIS — I5030 Unspecified diastolic (congestive) heart failure: Secondary | ICD-10-CM | POA: Diagnosis not present

## 2023-06-24 DIAGNOSIS — E785 Hyperlipidemia, unspecified: Secondary | ICD-10-CM | POA: Diagnosis not present

## 2023-06-24 DIAGNOSIS — Z9641 Presence of insulin pump (external) (internal): Secondary | ICD-10-CM | POA: Diagnosis not present

## 2023-06-25 DIAGNOSIS — N186 End stage renal disease: Secondary | ICD-10-CM | POA: Diagnosis not present

## 2023-06-25 DIAGNOSIS — N2581 Secondary hyperparathyroidism of renal origin: Secondary | ICD-10-CM | POA: Diagnosis not present

## 2023-06-25 DIAGNOSIS — I5030 Unspecified diastolic (congestive) heart failure: Secondary | ICD-10-CM | POA: Diagnosis not present

## 2023-06-25 DIAGNOSIS — Z992 Dependence on renal dialysis: Secondary | ICD-10-CM | POA: Diagnosis not present

## 2023-06-27 DIAGNOSIS — N186 End stage renal disease: Secondary | ICD-10-CM | POA: Diagnosis not present

## 2023-06-27 DIAGNOSIS — I5030 Unspecified diastolic (congestive) heart failure: Secondary | ICD-10-CM | POA: Diagnosis not present

## 2023-06-27 DIAGNOSIS — Z992 Dependence on renal dialysis: Secondary | ICD-10-CM | POA: Diagnosis not present

## 2023-06-27 DIAGNOSIS — N2581 Secondary hyperparathyroidism of renal origin: Secondary | ICD-10-CM | POA: Diagnosis not present

## 2023-06-28 DIAGNOSIS — Z992 Dependence on renal dialysis: Secondary | ICD-10-CM | POA: Diagnosis not present

## 2023-06-28 DIAGNOSIS — Z9181 History of falling: Secondary | ICD-10-CM | POA: Diagnosis not present

## 2023-06-28 DIAGNOSIS — N2581 Secondary hyperparathyroidism of renal origin: Secondary | ICD-10-CM | POA: Diagnosis not present

## 2023-06-28 DIAGNOSIS — I5030 Unspecified diastolic (congestive) heart failure: Secondary | ICD-10-CM | POA: Diagnosis not present

## 2023-06-28 DIAGNOSIS — J209 Acute bronchitis, unspecified: Secondary | ICD-10-CM | POA: Diagnosis not present

## 2023-06-28 DIAGNOSIS — N186 End stage renal disease: Secondary | ICD-10-CM | POA: Diagnosis not present

## 2023-06-30 DIAGNOSIS — G4731 Primary central sleep apnea: Secondary | ICD-10-CM | POA: Diagnosis not present

## 2023-07-01 DIAGNOSIS — N186 End stage renal disease: Secondary | ICD-10-CM | POA: Diagnosis not present

## 2023-07-01 DIAGNOSIS — I5030 Unspecified diastolic (congestive) heart failure: Secondary | ICD-10-CM | POA: Diagnosis not present

## 2023-07-01 DIAGNOSIS — Z992 Dependence on renal dialysis: Secondary | ICD-10-CM | POA: Diagnosis not present

## 2023-07-01 DIAGNOSIS — N2581 Secondary hyperparathyroidism of renal origin: Secondary | ICD-10-CM | POA: Diagnosis not present

## 2023-07-02 ENCOUNTER — Ambulatory Visit (INDEPENDENT_AMBULATORY_CARE_PROVIDER_SITE_OTHER): Payer: Medicare PPO

## 2023-07-02 DIAGNOSIS — I5030 Unspecified diastolic (congestive) heart failure: Secondary | ICD-10-CM | POA: Diagnosis not present

## 2023-07-02 DIAGNOSIS — N186 End stage renal disease: Secondary | ICD-10-CM | POA: Diagnosis not present

## 2023-07-02 DIAGNOSIS — N2581 Secondary hyperparathyroidism of renal origin: Secondary | ICD-10-CM | POA: Diagnosis not present

## 2023-07-02 DIAGNOSIS — I495 Sick sinus syndrome: Secondary | ICD-10-CM

## 2023-07-02 DIAGNOSIS — Z992 Dependence on renal dialysis: Secondary | ICD-10-CM | POA: Diagnosis not present

## 2023-07-04 DIAGNOSIS — N2581 Secondary hyperparathyroidism of renal origin: Secondary | ICD-10-CM | POA: Diagnosis not present

## 2023-07-04 DIAGNOSIS — Z992 Dependence on renal dialysis: Secondary | ICD-10-CM | POA: Diagnosis not present

## 2023-07-04 DIAGNOSIS — N186 End stage renal disease: Secondary | ICD-10-CM | POA: Diagnosis not present

## 2023-07-04 DIAGNOSIS — I5030 Unspecified diastolic (congestive) heart failure: Secondary | ICD-10-CM | POA: Diagnosis not present

## 2023-07-04 LAB — CUP PACEART REMOTE DEVICE CHECK
Battery Impedance: 1516 Ohm
Battery Remaining Longevity: 52 mo
Battery Voltage: 2.77 V
Brady Statistic AP VP Percent: 0 %
Brady Statistic AP VS Percent: 1 %
Brady Statistic AS VP Percent: 0 %
Brady Statistic AS VS Percent: 99 %
Date Time Interrogation Session: 20241211124706
Implantable Lead Connection Status: 753985
Implantable Lead Connection Status: 753985
Implantable Lead Implant Date: 20140130
Implantable Lead Implant Date: 20140130
Implantable Lead Location: 753859
Implantable Lead Location: 753860
Implantable Lead Model: 5076
Implantable Lead Model: 5076
Implantable Pulse Generator Implant Date: 20140130
Lead Channel Impedance Value: 402 Ohm
Lead Channel Impedance Value: 532 Ohm
Lead Channel Pacing Threshold Amplitude: 0.5 V
Lead Channel Pacing Threshold Amplitude: 0.625 V
Lead Channel Pacing Threshold Pulse Width: 0.4 ms
Lead Channel Pacing Threshold Pulse Width: 0.4 ms
Lead Channel Setting Pacing Amplitude: 1.5 V
Lead Channel Setting Pacing Amplitude: 2.5 V
Lead Channel Setting Pacing Pulse Width: 0.4 ms
Lead Channel Setting Sensing Sensitivity: 5.6 mV
Zone Setting Status: 755011
Zone Setting Status: 755011

## 2023-07-05 DIAGNOSIS — I5030 Unspecified diastolic (congestive) heart failure: Secondary | ICD-10-CM | POA: Diagnosis not present

## 2023-07-05 DIAGNOSIS — N2581 Secondary hyperparathyroidism of renal origin: Secondary | ICD-10-CM | POA: Diagnosis not present

## 2023-07-05 DIAGNOSIS — N186 End stage renal disease: Secondary | ICD-10-CM | POA: Diagnosis not present

## 2023-07-05 DIAGNOSIS — Z992 Dependence on renal dialysis: Secondary | ICD-10-CM | POA: Diagnosis not present

## 2023-07-08 DIAGNOSIS — N186 End stage renal disease: Secondary | ICD-10-CM | POA: Diagnosis not present

## 2023-07-08 DIAGNOSIS — N2581 Secondary hyperparathyroidism of renal origin: Secondary | ICD-10-CM | POA: Diagnosis not present

## 2023-07-08 DIAGNOSIS — I5030 Unspecified diastolic (congestive) heart failure: Secondary | ICD-10-CM | POA: Diagnosis not present

## 2023-07-08 DIAGNOSIS — Z992 Dependence on renal dialysis: Secondary | ICD-10-CM | POA: Diagnosis not present

## 2023-07-09 DIAGNOSIS — N2581 Secondary hyperparathyroidism of renal origin: Secondary | ICD-10-CM | POA: Diagnosis not present

## 2023-07-09 DIAGNOSIS — N186 End stage renal disease: Secondary | ICD-10-CM | POA: Diagnosis not present

## 2023-07-09 DIAGNOSIS — Z992 Dependence on renal dialysis: Secondary | ICD-10-CM | POA: Diagnosis not present

## 2023-07-09 DIAGNOSIS — I5030 Unspecified diastolic (congestive) heart failure: Secondary | ICD-10-CM | POA: Diagnosis not present

## 2023-07-10 DIAGNOSIS — E785 Hyperlipidemia, unspecified: Secondary | ICD-10-CM | POA: Diagnosis not present

## 2023-07-10 DIAGNOSIS — I10 Essential (primary) hypertension: Secondary | ICD-10-CM | POA: Diagnosis not present

## 2023-07-10 DIAGNOSIS — I350 Nonrheumatic aortic (valve) stenosis: Secondary | ICD-10-CM | POA: Diagnosis not present

## 2023-07-10 DIAGNOSIS — K7460 Unspecified cirrhosis of liver: Secondary | ICD-10-CM | POA: Diagnosis not present

## 2023-07-10 DIAGNOSIS — I5022 Chronic systolic (congestive) heart failure: Secondary | ICD-10-CM | POA: Diagnosis not present

## 2023-07-10 DIAGNOSIS — K7581 Nonalcoholic steatohepatitis (NASH): Secondary | ICD-10-CM | POA: Diagnosis not present

## 2023-07-10 DIAGNOSIS — K769 Liver disease, unspecified: Secondary | ICD-10-CM | POA: Diagnosis not present

## 2023-07-11 DIAGNOSIS — Z992 Dependence on renal dialysis: Secondary | ICD-10-CM | POA: Diagnosis not present

## 2023-07-11 DIAGNOSIS — N2581 Secondary hyperparathyroidism of renal origin: Secondary | ICD-10-CM | POA: Diagnosis not present

## 2023-07-11 DIAGNOSIS — I5030 Unspecified diastolic (congestive) heart failure: Secondary | ICD-10-CM | POA: Diagnosis not present

## 2023-07-11 DIAGNOSIS — N186 End stage renal disease: Secondary | ICD-10-CM | POA: Diagnosis not present

## 2023-07-12 ENCOUNTER — Other Ambulatory Visit (HOSPITAL_COMMUNITY): Payer: Self-pay

## 2023-07-12 ENCOUNTER — Other Ambulatory Visit: Payer: Self-pay

## 2023-07-12 ENCOUNTER — Ambulatory Visit (HOSPITAL_COMMUNITY)
Admission: RE | Admit: 2023-07-12 | Discharge: 2023-07-12 | Disposition: A | Discharge status: Home / Self Care | Payer: Medicare PPO | Source: Ambulatory Visit | Attending: Cardiology | Admitting: Cardiology

## 2023-07-12 ENCOUNTER — Encounter (HOSPITAL_COMMUNITY): Payer: Self-pay | Admitting: Cardiology

## 2023-07-12 ENCOUNTER — Telehealth (HOSPITAL_COMMUNITY): Payer: Self-pay | Admitting: Pharmacy Technician

## 2023-07-12 VITALS — BP 112/70 | HR 88 | Wt 116.8 lb

## 2023-07-12 DIAGNOSIS — E785 Hyperlipidemia, unspecified: Secondary | ICD-10-CM | POA: Diagnosis not present

## 2023-07-12 DIAGNOSIS — Z952 Presence of prosthetic heart valve: Secondary | ICD-10-CM | POA: Diagnosis present

## 2023-07-12 DIAGNOSIS — I5032 Chronic diastolic (congestive) heart failure: Secondary | ICD-10-CM | POA: Insufficient documentation

## 2023-07-12 DIAGNOSIS — I425 Other restrictive cardiomyopathy: Secondary | ICD-10-CM | POA: Insufficient documentation

## 2023-07-12 DIAGNOSIS — K3184 Gastroparesis: Secondary | ICD-10-CM | POA: Diagnosis not present

## 2023-07-12 DIAGNOSIS — Z9641 Presence of insulin pump (external) (internal): Secondary | ICD-10-CM | POA: Insufficient documentation

## 2023-07-12 DIAGNOSIS — I480 Paroxysmal atrial fibrillation: Secondary | ICD-10-CM

## 2023-07-12 DIAGNOSIS — D472 Monoclonal gammopathy: Secondary | ICD-10-CM | POA: Insufficient documentation

## 2023-07-12 DIAGNOSIS — I2510 Atherosclerotic heart disease of native coronary artery without angina pectoris: Secondary | ICD-10-CM | POA: Diagnosis not present

## 2023-07-12 DIAGNOSIS — N2581 Secondary hyperparathyroidism of renal origin: Secondary | ICD-10-CM | POA: Diagnosis not present

## 2023-07-12 DIAGNOSIS — I132 Hypertensive heart and chronic kidney disease with heart failure and with stage 5 chronic kidney disease, or end stage renal disease: Secondary | ICD-10-CM | POA: Diagnosis not present

## 2023-07-12 DIAGNOSIS — I5022 Chronic systolic (congestive) heart failure: Secondary | ICD-10-CM | POA: Diagnosis not present

## 2023-07-12 DIAGNOSIS — I5030 Unspecified diastolic (congestive) heart failure: Secondary | ICD-10-CM | POA: Diagnosis not present

## 2023-07-12 DIAGNOSIS — N186 End stage renal disease: Secondary | ICD-10-CM | POA: Diagnosis not present

## 2023-07-12 DIAGNOSIS — I495 Sick sinus syndrome: Secondary | ICD-10-CM | POA: Diagnosis not present

## 2023-07-12 DIAGNOSIS — Z9013 Acquired absence of bilateral breasts and nipples: Secondary | ICD-10-CM | POA: Diagnosis not present

## 2023-07-12 DIAGNOSIS — Z8571 Personal history of Hodgkin lymphoma: Secondary | ICD-10-CM | POA: Diagnosis not present

## 2023-07-12 DIAGNOSIS — Z955 Presence of coronary angioplasty implant and graft: Secondary | ICD-10-CM | POA: Diagnosis not present

## 2023-07-12 DIAGNOSIS — D696 Thrombocytopenia, unspecified: Secondary | ICD-10-CM | POA: Insufficient documentation

## 2023-07-12 DIAGNOSIS — G4733 Obstructive sleep apnea (adult) (pediatric): Secondary | ICD-10-CM | POA: Diagnosis not present

## 2023-07-12 DIAGNOSIS — Z951 Presence of aortocoronary bypass graft: Secondary | ICD-10-CM | POA: Insufficient documentation

## 2023-07-12 DIAGNOSIS — Z950 Presence of cardiac pacemaker: Secondary | ICD-10-CM | POA: Insufficient documentation

## 2023-07-12 DIAGNOSIS — Z992 Dependence on renal dialysis: Secondary | ICD-10-CM | POA: Insufficient documentation

## 2023-07-12 DIAGNOSIS — E1022 Type 1 diabetes mellitus with diabetic chronic kidney disease: Secondary | ICD-10-CM | POA: Diagnosis not present

## 2023-07-12 DIAGNOSIS — D500 Iron deficiency anemia secondary to blood loss (chronic): Secondary | ICD-10-CM | POA: Insufficient documentation

## 2023-07-12 DIAGNOSIS — E1043 Type 1 diabetes mellitus with diabetic autonomic (poly)neuropathy: Secondary | ICD-10-CM | POA: Diagnosis not present

## 2023-07-12 DIAGNOSIS — K7469 Other cirrhosis of liver: Secondary | ICD-10-CM | POA: Insufficient documentation

## 2023-07-12 DIAGNOSIS — I081 Rheumatic disorders of both mitral and tricuspid valves: Secondary | ICD-10-CM | POA: Insufficient documentation

## 2023-07-12 LAB — ECHOCARDIOGRAM COMPLETE
AR max vel: 0.81 cm2
AV Area VTI: 0.84 cm2
AV Area mean vel: 0.84 cm2
AV Mean grad: 13 mm[Hg]
AV Peak grad: 23.6 mm[Hg]
Ao pk vel: 2.43 m/s
Calc EF: 77.5 %
MV VTI: 0.97 cm2
S' Lateral: 2 cm
Single Plane A2C EF: 87.9 %
Single Plane A4C EF: 57.6 %

## 2023-07-12 MED ORDER — APIXABAN 5 MG PO TABS: 5.0000 mg | ORAL_TABLET | Freq: Two times a day (BID) | ORAL | 11 refills | Status: DC

## 2023-07-12 NOTE — Telephone Encounter (Signed)
Patient Advocate Encounter   Received notification from [facility] that prior authorization for Eliquis is required.   PA submitted on CoverMyMeds Key [id number]  Status is pending   Will continue to follow.

## 2023-07-12 NOTE — Telephone Encounter (Signed)
[facility] Patient Advocate Encounter  Prior Authorization for Eliquis has been approved.    PA# [id number] Effective dates: 07/23/22 through 07/22/24  Archer Asa, CPhT

## 2023-07-12 NOTE — Patient Instructions (Addendum)
Stop Brilinta. The next day start Eliquis 5 mg twice daily - Rx sent to local pharmacy. CBC (lab) in two weeks - dialysis center may do this. Return to see Dr. Shirlee Latch in 2 months - see below. Please call us at [phone] if any questions or concerns prior to your next visit.

## 2023-07-13 NOTE — Progress Notes (Signed)
Date:  07/13/2023   ID:  [name], [name] April 20, 1951, MRN [id number]   Provider location: [address] [place] Type of Visit: Established patient  PCP:  Aliene Beams, MD  Cardiologist:  None Primary HF: Dr. Shirlee Latch   History of Present Illness: [name] [name] is a 72 y.o. female who has a history of CAD s/p CABG in 12/05 and redo in 1/11, bioprosthetic AVR, diastolic CHF, and sick sinus syndrome s/p PPM presents for cardiology followup.  She had initial CABG with SVG-RCA in 12/05 followed by redo SVG-RCA and bioprosthetic AVR in 1/11.  She has had diastolic CHF and is on Lasix.  In 9/14, she had a CPX test.  This showed severe functional limitation with ischemic ECG changes (inferolateral ST depression and ST elevation in V1 and V2). She had chest pain.  She had R/LHC in 9/14.  This showed elevated left and right heart filling pressures, patent SVG-PDA, and 80% distal LM/80% ostial LAD stenosis.  She was not a candidate for redo CABG (had 2 prior sternotomies as well as chest radiation for Hodgkins lymphoma).  She had DES from left main into proximal LAD and will need long-term DAPT => Brilinta was used as she has been a poor responder to Plavix in the past.  She re-developed angina in 11/15 and was admitted with unstable angina.  LHC showed 80% ostial LM and patent SVG-RCA. She had DES to ostial LM. Echo 1/18 showed EF 60-65%, normal bioprosthetic aortic valve, mild-moderate MR, PASP 35 mmHg.    She has chronic iron deficiency anemia thought to be due to a slow GI bleed.  Last EGD was unremarkable and a capsule endoscopy was also unremarkable.  She has had periodic transfusions.     She had bilateral mastectomy for DCIS in 10/16 without complication.   She has an insulin pump followed by an endocrinologist at [facility].    Given myalgias with statins, she was started on Praluent.  LDL is now excellent.     In the past she has had short runs of atrial fibrillation.  She is  not anticoagulated. Planned for Watchman placement, but given her nickel allergy, we were unable to place Watchman.    Diagnosed with OSA, now on CPAP.    Given ongoing problems with exertional dyspnea and occasional chest pain, Cardiolite was repeated in 9/18 and showed inferior changes.  She underwent left/right heart cath in 9/18.  This showed elevated filling pressures, R>L.  There was 75+% ostial LCx stenosis.  However, there were 2 layers of stent from the mid left main into the ostial LAD, and LCx intervention was thought to be very risky.  It was decided to manage her medically.  Echo was repeated in 10/18 showing EF 55-60%, moderate to severe TR.     Given recurrent chest pain, she had ETT-Cardiolite done in 3/19.  This was a normal study with no evidence for ischemia or infarction.  Echo in 1/20 showed EF 55-60%, mild LVH, stable bioprosthetic aortic valve, moderate-severe TR.    Due to increased dyspnea and worsening renal function, she was set up for echo and RHC.  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  RHC in 8/20 showed primarily right-sided HF.  Cardiolite in 8/20 showed no ischemia or infarction.  Torsemide was increased and she cut back on sodium in her diet.    CPX in 11/20 showed severe functional limitation from HF.   She saw Dr. Edwena Blow at [facility] for evaluation for heart/kidney transplant (for restrictive cardiomyopathy).  She was determined to be a poor surgical candidate due to extensive vascular calcifications.   CT chest in 8/21 showed a loculated right pleural effusion but unable to find enough fluid by ultrasound for thoracentesis.   Echo in 9/21 showed EF 60-65% with mild LVH, mild RV dilation with mildly decreased systolic function and a  D-shaped septum, PASP 40, normal bioprosthetic aortic valve, rheumatic-appearing MV with mild-moderate mitral stenosis (mean gradient 9 but MVA by PHT 2.7 cm^2), trivial MR.    Echo 2/23, EF 60-65%, mild LVH, normal RV systolic function but with D-shaped septum, normal bioprosthetic aortic valve, mild mitral stenosis (rheumatic-appearing valve, mean gradient 4 mmHg), mild MR.   Seen by hematology 10/23. Labs showed mild hypercalcemia. Calcium level 10.4 with albumin 4.1. SPEP showed M spike.  Kappa/lambda light chain ratio 2.17. Immunofixation showed polyclonal gammopathy. Plan is to return in 6 months to repeat myeloma labs. She has not had a cMRI due to PPM.   Echo at [facility] in 12/23 showed LVEF 55%, AoV bioprosthesis functioning normal. RV systolic function reported to be normal. Moderate TR noted. No mention of mitral stenosis.   Patient started HD in 10/24. Atrial fibrillation noted during her first HD session, only lasted a few minutes.   Cardiac PET in 10/24 showed EF 68% rest/55% stress; small mid-apical inferolateral partially reversible defect (infarct with peri-infarct ischemia), decreased myocardial blood flow reserve, +TID. Considered high risk.   Echo was done today and reviewed, showing EF 60-65% with moderate LVH, D-shaped interventricular septum, mild RV enlargement and mildly decreased systolic function, heavily calcified mitral valve with mean gradient 13 mmHg - moderate mitral stenosis and mild-moderate MR, bioprosthetic aortic valve with mean gradient 13 mmHg, severe TR.   Today she returns for followup of CHF and CAD.  Apple Watch has been showing more frequent atrial fibrillation, now about 9% of the time. She feels palpitations when she is in atrial fibrillation.  She continues to have difficulty with HD. She gets a headache and nausea with HD, she is "wiped out" afterwards. BP has been staying up with midodrine use.  She continues to have chest tightness generally with heavy  exertion.  She has generalized fatigue and also episodes of lightheadedness but no syncope. She fell once last weak due to losing balance.  She is short of breath walking longer distances such as into the office today.  Weight is unchanged.   ECG (personally reviewed): NSR, long 1st degree, LVH with repolarization abnormality   Labs @ [facility] (3/19): K 4.7, creatinine 1.4, ANA negative, RF negative Labs (3/20): K 4, creatinine 1.8 Labs (6/20): K 4.7, creatinine 1.47 Labs (8/20): creatinine 2.13 => 2.46 => 2.4, LDL 22 Labs (9/20): K 4.8, creatinine 2.74, AST/ALT normal, hgb 12.6 Labs (1/21): K 4.1, creatinine 1.86, hgb 11.7 Labs (4/21): K 4.7, creatinine 1.7 Labs (6/21): K 4.2, creatinine 2 => 1.7 Labs (8/21): K 3.8, creatinine 1.69, hgb 10.6, plts 116 Labs (9/21): LDL 34, TGs 209 Labs (1/22): K 3.8, creatinine 2.0 Labs (3/22): LDL 25 Labs (4/22): K 4.7, creatinine 2.0, hgb 12.3 Labs (5/22): K 4.4, creatinine 2.21 Labs (8/22): LDL 1, TGs 330 Labs (10/22): K 4.6, creatinine 1.87 Labs (11/22): LDL 26 Labs (12/22): K 4.1, creatinine 2.25 Labs (1/23): creatinine  2.65 Labs (3/23): K 4.6, creatinine 2.8 Labs (4/23): K 4.1, creatinine 2.16, BNP 94 Labs (10/23): K 4.2, creatinine 1.62, calcium 10.4  Labs (12/23): K 4.1, creatinine 2.23 Labs (4/24): K 4.6, SCr 2.6, hgb 12.5 Labs (5/24): K 4.4, creatinine 2.85 Labs (6/24): LDL 24, BNP 118 Labs (9/24): K 4.7, creatinine 2.5 Labs (11/24): ALT/AST normal   PMH: 1. Sick sinus syndrome s/p [employer] PCM.  2. HTN 3. H/o SBO 4. Renal cell carcinoma s/p right nephrectomy in 2008.  5. 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  Fibroscan in 11/14 showed advanced fibrosis concerning for cirrhosis. EGD in 11/14 showed no varices.  6. Diastolic CHF: Echo (4/14) with EF 60-65%, bioprosthetic aortic valve with mean gradient 15 mmHg, mild MR.  CPX (9/14): peak VO2 10.4, VE/VCO2 43.8, inferolateral ST depression and ST elevation in V1/V2 with severe dyspnea and  chest pain, severe functional limitation with ischemic changes.  Echo (9/14) with EF 60-65%, normal RV size and systolic function, mild-moderate MR, bioprosthetic aortic valve normal, PA systolic pressure 51 mmHg, no evidence for pericardial constriction though exam incomplete for constriction.  Echo (11/15) with EF 55-60%, bioprosthetic aortic valve, mild-moderate MR, moderate TR, PA systolic pressure 42 mmHg.  - TEE (5/17) with EF 60-65%, normal bioprosthetic aortic valve with mean gradient 13 mmHg, normal RV size and systolic function. - Echo (1/18) with EF 60-65%, normal bioprosthetic aortic valve, mild-moderate MR, PASP 35 mmHg.  - Echo (10/18) with EF 55-60%, bioprosthetic aortic valve mean gradient 10 mmHg, MAC with mild mitral stenosis (mean gradient 7) and mild mitral regurgitation, moderate to severe TR, PASP 43 mmHg.  - RHC (9/18): mean RA 13, PA 44/18 mean 33, mean PCWP 19, CI 3.45, PVR 2.7 WU.  - Echo (1/20): EF 55-60%, mild LVH, mild MR, stable bioprosthetic aortic valve, moderate-severe TR.  - RHC (8/20): mean RA 14, PA 43/15 mean 28, mean PCWP 13, PAPI 2, PVR 4.2, CI 2.31 - Echo (8/20): EF 55-60%, RV mildly dilated with normal systolic function, mildly D-shaped septum suggestive of RV pressure/volume overload, rheumatic-appearing mitral valve with mild mitral stenosis (mean gradient 6, MVA by PHT 4 cm^2), no evidence for pericardial constriction, moderate TR, bioprosthetic aortic valve functioning normally with mean gradient 6 mmHg.  - CPX (11/20): peak VO2 9.8, VE/VCO2 slope 38, RER 1.11 => severe functional limitation from HF.   - Echo (9/21): EF 60-65% with mild LVH, mild RV dilation with mildly decreased systolic function and a D-shaped septum, PASP 40, normal bioprosthetic aortic valve, rheumatic-appearing MV with mild-moderate mitral stenosis (mean gradient 9 but MVA by PHT 2.7 cm^2, trivial MR.  - Echo (2/23): EF 60-65%, mild LVH, normal RV systolic function but with D-shaped septum,  normal bioprosthetic aortic valve, mild mitral stenosis (rheumatic-appearing valve, mean gradient 4 mmHg), mild MR.  - RHC 1/24: Elevated right heart filling pressures out of proportion to PCWP suggesting primarily RV failure. Mild PAH. Restrictive filling picture. Low cardiac index at 1.71. (RA 13, PA 43/15, mean 25, PCWP 12 with v waves to 20, CO 2.54, CI 1.71) - Echo (12/23, [facility]): LVEF 55%, AoV bioprosthesis functioning normal. RV systolic function reported to be normal. Moderate TR noted. No mention of mitral stenosis.  - Echo (12/24): EF 60-65% with moderate LVH, D-shaped interventricular septum, mild RV enlargement and mildly decreased systolic function, heavily calcified mitral valve with mean gradient 13 mmHg - moderate mitral stenosis and mild-moderate MR, bioprosthetic aortic valve with mean gradient 13 mmHg, severe TR. 7. TAH-BSO 8. Type  I diabetes: Has insulin pump.  9. H/o ischemic colitis. 10. H/o PUD. 11. Diabetic gastroparesis. 12. Hyperlipidemia: Myalgias with Crestor and atorvastatin, elevated LFTs with simvastatin.  Myalgias with > 10 mg pravastatin. Now on Praluent.  13. ESRD 14. Bicuspid aortic valve s/p 19 mm Edwards pericardial valve in 1/11 (had post-operative Dresslers syndrome).   15. CAD: CABG with SVG-RCA in 12/05.  Redo SVG-RCA in 1/11 with AVR.  LHC/RHC (9/14) with mean RA 18, PA 62/25 mean 43, mean PCWP 26, CI 2.8, 70-80% distal LM stenosis, 80% ostial LAD stenosis, total occlusion RCA, SVG-PDA patent.  Patient had DES LM into LAD.  Plan to continue long-term ASA/Brilinta (poor responder to Plavix).  ETT-cardiolite (4/15) with 6' exercise, EF 60%, ST depression in recovery and mild chest pain, no ischemia or infarction by perfusion images.  Unstable angina 11/15 with 80% LM, patent SVG-RCA => DES to ostial LM.   - Lexiscan Cardiolite (2/18): EF 62%, no ischemia or infarction (normal).  She had a significant reaction to [place] and probably should not get again.  -  Cardiolite (9/18): EF 72%, prior inferior infarct with mild peri-infarct ischemia.  - LHC (9/18): 2 layers stent left main - ostial LAD with 50% dLM in-stent restenosis, 75+% ostial LCx, totally occluded RCA, SVG-RCA patent.  Medical management planned.  - ETT-Cardiolite (4/19): EF normal, 7'11" exercise, no ischemia or infarction.  - Lexiscan Cardiolite (8/20): No ischemia/infarction.  - Cardiac PET in 10/24 showed EF 68% rest/55% stress; small mid-apical inferolateral partially reversible defect (infarct with peri-infarct ischemia), decreased myocardial blood flow reserve, +TID. Considered high risk.  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  17. Anemia: Iron-deficiency.  Suspect chronic GI blood loss.  EGD in 9/14 was unremarkable.  Capsule endoscopy 10/14 was unremarkable.  Has required periodic blood transfusions.  18. Atrial fibrillation: Paroxysmal, noted by [facility] interrogation.  - Zio monitor 3/23: < 1% atrial fibrillation, longest run was 18 minutes.  19. Carotid stenosis: Carotid dopplers (2/15) with 40-59% bilateral stenosis. Carotid dopplers (3/16) with 40-59% bilateral ICA stenosis. Carotid dopplers (3/17) with 40-59% bilateral ICA stenosis.  - Carotid dopplers (3/18) with mild BICA stenosis.  20. 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  22. C difficile colitis 2017 23. Gout 24. ?TIA: head CT negative.  25. OSA: Moderate, uses CPAP.  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 Hypercalcemia 28. Mitral stenosis: Suspect calcific/degenerative.  Mild by 8/20 echo.  Mild-moderate on 9/21 echo.  Mild on 2/23 echo.  - Echo in 12/24 suggestive of moderate mitral stenosis.  29. Tricuspid regurgitation: Moderate by 8/20 echo. Severe on 12/24 echo.  30. Lung nodules: Followed at [facility] => stable.  31. Essential tremor  Current  Outpatient Medications  Medication Sig Dispense Refill   acyclovir ointment (ZOVIRAX) 5 % Apply 1 application topically every 3 (three) hours as needed (fever blister/cold sores).     ALPRAZolam (XANAX) 0.5 MG tablet Take 1 tablet (0.5 mg total) by mouth 2 (two) times daily as needed for anxiety. 60 tablet 1   APIDRA 100 UNIT/ML injection Inject 40 Units into the skin as directed. INFUSE THROUGH INSULIN PUMP UTD, Bolus depends on carb intake  2   apixaban (ELIQUIS) 5 MG TABS tablet Take 1 tablet (5 mg total) by mouth 2 (two) times daily. 60 tablet 11   aspirin EC 81 MG tablet Take 81 mg by mouth at  bedtime.     cetirizine (ZYRTEC) 10 MG tablet Take 10 mg by mouth in the morning.     clobetasol (OLUX) 0.05 % topical foam Apply 1 application  topically 2 (two) times daily as needed (skin flaking (legs)).     Coenzyme Q10 (CO Q 10) 100 MG CAPS Take 100 mg by mouth at bedtime.      colchicine 0.6 MG tablet Take 0.6 mg by mouth daily as needed (gout flare).     denosumab (PROLIA) 60 MG/ML SOSY injection Inject 60 mg into the skin every 6 (six) months.     diclofenac Sodium (VOLTAREN) 1 % GEL Apply 2 g topically 4 (four) times daily as needed (pain.).     escitalopram (LEXAPRO) 5 MG tablet Take 5 mg by mouth at bedtime.     famotidine (PEPCID) 40 MG tablet Take 40 mg by mouth at bedtime.     gabapentin (NEURONTIN) 100 MG capsule Take 100 mg by mouth daily.     glucagon (GLUCAGON EMERGENCY) 1 MG injection Inject 1 mg into the vein once as needed (low blood sugar).     isosorbide mononitrate (IMDUR) 120 MG 24 hr tablet Take 1 tablet (120 mg total) by mouth every morning.     latanoprost (XALATAN) 0.005 % ophthalmic solution Place 1 drop into both eyes at bedtime.     metolazone (ZAROXOLYN) 2.5 MG tablet Take 2.5 mg (1 tab) once as directed by CHF clinic. Call before taking [phone] 3 tablet 0   metoprolol succinate (TOPROL XL) 25 MG 24 hr tablet Take 0.5 tablets (12.5 mg total) by mouth daily. 45  tablet 3   midodrine (PROAMATINE) 5 MG tablet Take 1 tablet (5 mg total) by mouth daily as needed (Prior to HD). 30 tablet 3   mometasone (NASONEX) 50 MCG/ACT nasal spray Place 1 spray into the nose daily as needed (allergies).     mupirocin cream (BACTROBAN) 2 % Apply 1 application topically 2 (two) times daily as needed. 90 g 0   nitroGLYCERIN (NITROLINGUAL) 0.4 MG/SPRAY spray PLACE 1 SPRAY UNDER TH TONGUE EVERY 5 MINUTES FOR 3 DOSES AS NEEDED FOR CHEST PAIN 4.9 g 5   polyethylene glycol (MIRALAX / GLYCOLAX) 17 g packet Take 17 g by mouth in the morning. Hold for loose stools     pravastatin (PRAVACHOL) 10 MG tablet TAKE ONE TABLET (10 MG TOTAL) BY MOUTH DAILY. 90 tablet 3   Probiotic Product (ALIGN PO) Take 1 tablet by mouth daily in the afternoon.     REPATHA SURECLICK 140 MG/ML SOAJ INJECT 140MG  INTO THE SKIN EVERY 14 DAYS 6 mL 3   torsemide (DEMADEX) 20 MG tablet Take 40 mg Twice daily on Wed,sat and sun 64 tablet 3   ULORIC 40 MG tablet Take 1 tablet (40 mg total) by mouth at bedtime. 90 tablet 3   VASCEPA 1 g capsule TAKE TWO CAPSULES (2 GRAMS TOTAL) BY MOUTH TWO TIMES DAILY 360 capsule 3   No current facility-administered medications for this encounter.    Allergies:   Avandia [rosiglitazone maleate], Cephalexin, Clindamycin, Humalog [insulin lispro], Lincomycin, Metformin, Novolog [insulin aspart (human analog)], Penicillins, Tizanidine, Adhesive [tape], Atorvastatin, Cholestyramine, Gentamycin [gentamicin], Iodinated contrast media, Limonene, Prednisone, Ranexa [ranolazine], Rosuvastatin, Simvastatin, Zinc, Acetaminophen, Azelastine, Clopidogrel, Kerendia [finerenone], Lisinopril, Neosporin [bacitracin-polymyxin b], Codeine, Erythromycin, Hydrocodone-acetaminophen, Latex, Neomycin, Nickel, Ranitidine, Tamiflu [oseltamivir], and Tramadol   Social History:  The patient  reports that she has never smoked. She has never used smokeless tobacco. She reports that she  does not drink alcohol and  does not use drugs.   Family History:  The patient's family history includes Anal fissures in her mother; Breast cancer (age of onset: 51) in her sister; Cancer (age of onset: 69) in her sister; Cancer (age of onset: 8) in her maternal grandfather; Diabetes in her father; Glaucoma in her brother; Heart attack in her father, maternal grandmother, and mother; Heart disease in her father and mother; Hypertension in her brother; Lung cancer in her sister; Parkinson's disease in her paternal aunt.   ROS:  Please see the history of present illness.   All other systems are personally reviewed and negative.   Exam:   BP 112/70   Pulse 88   Wt 53 kg (116 lb 12.8 oz)   SpO2 97%   BMI 21.36 kg/m   PHYSICAL EXAM: General: NAD Neck: JVP 8-9 cm, no thyromegaly or thyroid nodule.  Lungs: Clear to auscultation bilaterally with normal respiratory effort. CV: Nondisplaced PMI.  Heart regular S1/S2, no S3/S4, 3/6 SEM RUSB and HSM LLSB.  No peripheral edema.  No carotid bruit.  Normal pedal pulses.  Abdomen: Soft, nontender, no hepatosplenomegaly, mild distention.  Skin: Intact without lesions or rashes.  Neurologic: Alert and oriented x 3.  Psych: Normal affect. Extremities: No clubbing or cyanosis.  HEENT: Normal.   Wt Readings from Last 3 Encounters:  07/12/23 53 kg (116 lb 12.8 oz)  06/10/23 52.8 kg (116 lb 6.4 oz)  06/05/23 52.8 kg (116 lb 6.4 oz)   ASSESSMENT AND PLAN: 1. Chronic diastolic CHF: Restrictive cardiomyopathy.  Most recent echo in 2/23 shows normal LV systolic function, normal RV systolic function but D-shaped septum suggesting RV pressure/volume overload, mild MS, normal bioprosthetic AoV, no evidence for pericardial constriction. RHC in 8/20 was concerning for right > left heart failure, she did appear to have equalization of diastolic pressures concerning for restrictive cardiomyopathy, possibly related to prior radiation. CPX in 11/20 showed severe HF limitation.  She was evaluated  at [facility] for heart/kidney transplant for CKD and restrictive cardiomyopathy, but extensive vascular calcification makes her a poor surgical candidate. Balance of renal failure and RV failure has been difficult. RHC 1/24 showed elevated right heart filling pressures out of proportion to PCWP suggesting primarily RV failure. Mild PAH. Restrictive filling picture. Low cardiac index at 1.71 (RA 13, PA 43/15, mean 25, PCWP 12 with v waves to 20, CO 2.54, CI 1.71). Echo today showed EF 60-65% with moderate LVH, D-shaped interventricular septum, mild RV enlargement and mildly decreased systolic function, heavily calcified mitral valve with mean gradient 13 mmHg - moderate mitral stenosis and mild-moderate MR, bioprosthetic aortic valve with mean gradient 13 mmHg, severe TR. NYHA class III, mild volume overload still. Volume controlled by HD and torsemide on non-HD days, hopes to get to home HD.   - Continue torsemide 40 mg bid on non-HD days. She still makes urine.  - Continue midodrine 10 mg pre-HD to allow more ultrafiltration.  2. CAD: s/p SVG-RCA in 12/05, then redo SVG-RCA in 1/11--> distal left main/proximal LAD severe stenosis in 9/14.  PCI with DES to distal LM/proximal LAD was done rather than bypass as she would have been a poor candidate for redo sternotomy (2 prior sternotomies and history of chest wall radiation for Hodgkins disease).  Recurrent unstable angina with 80% ostial LM treated with repeat DES in 11/15.  Lexiscan Cardiolite in 9/18 inferior changes.  Repeat LHC (9/18) showed 50% distal left main in-stent restenosis and 75+% ostial  LCx stenosis. I reviewed the films at the time with interventional cardiology => would be very difficult to intervene on the ostial LCx through 2 overlapping stents from left main into the LAD.  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 Given no ACS, we planned medical management and increased Imdur.  Cardiolite in 3/19 and again in 8/20  showed no ischemia or infarction.  Cardiac PET in 10/24 showed EF 68% rest/55% stress; small mid-apical inferolateral partially reversible defect (infarct with peri-infarct ischemia), decreased myocardial blood flow reserve, +TID. Considered high risk.  The inferolateral partially reversible defect likely corresponds to the LCx ostial disease noted on last cath, no good option for intervention.  She continues to have chest pain with heavier exertion, I do not think that the pattern has changed.  - Unless symptoms significantly worsen, would hold off on repeat cath as cPET suggests inferolateral/LCx territory ischemia and no good interventional option for known LCx ostial disease.  - Midodrine as above to keep BP up with HD.  - Continue Imdur 120 mg daily.  - Has been on Brilinta 60 mg bid (poor responder to Plavix) long-term given recurrent left main disease.  However, will plan to stop Brilinta and start Eliquis (see below).  - Continue ASA 81, pravastin/Repatha/Vascepa, good lipids in 6/24.  - Continue Toprol XL 12.5 mg daily.  - She was unable to tolerate ranolazine.  3. Bioprosthetic aortic valve replacement: In setting of bicuspid aortic valve, thoracic aorta not dilated on 2017 TEE.  Valve looked ok on 12/24 echo. 4. ESRD:  Patient has a single kidney s/p right nephrectomy and suspected diabetic nephropathy.  She has progressed to ESRD and is getting iHD.  Currently quality of life with HD is poor.  - As above, continue midodrine to try to allow more fluid removal.  - Not candidate for PD given prior abdominal surgery.  - Would like to get her to home HD, she is discussing with Dr. Marisue Humble.  5. Hyperlipidemia: She has not been able to tolerate any higher potency statin than pravastatin 10 mg daily.   - Continue pravastatin.  - Continue Repatha.   - She is on Vascepa for elevated TGs.  6. Anemia: Chronic/slow GI bleed.  Unremarkable EGD and capsule endoscopy in fall 2014.   7. Atrial  fibrillation: Paroxysmal, episodes noted on PCM interrogation. Given GI bleeding and need to be on Brilinta, risks in the past have been thought to outweigh benefits of anticoagulation. However, she seems to be having more atrial fibrillation now.  She feels palpitations and says that her Apple Watch tells her that she is in AF 9% of the time.  She is in NSR today.  With ESRD, her only anti-arrhythmic option is going to be amiodarone.   - We were unable to place Watchman device due to nickel allergy.  - With increasing AF burden, I think we should transition her from ticagrelor to Eliquis.  Given the situation with left main stent outlined above, would continue ASA 81 daily.  I discussed dosing of Eliquis with our HF pharmacist, she recommended 5 mg bid.  I will have her stop ticagrelor and start on Eliquis 5 mg bid.   - She is symptomatic when in AF, primarily with palpitations.  We discussed initiation of amiodarone.  She wants to think about it and we will discuss when I see her next.  8. Carotid bruit: Only mild stenosis on last dopplers in 3/18.  9. SSS:  Pacemaker in place.  10. HTN: BP controlled.    11. OSA: Continue ASV.  12. ?TIA: Transient dysarthria in 3/18, could have been afib-related TIA.  No recurrent symptoms.  13. Thrombocytopenia: Chronic and mild. Follows with hematology.  14. Mitral stenosis: Suspect calcific/degenerative.  Moderate on 12/24 echo.  She would not be a candidate for valve replacement.  15. Hematology: MGUS noted, has seen hematology.  Low suspicion for cardiac amyloidosis  16. 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  EGD 07/26/22 negative for varices  Followup in 2 months.   Signed, Marca Ancona, MD  07/13/2023  [facility] [address] [facility] [place] [address] [phone] (office) [phone] (fax)

## 2023-07-15 DIAGNOSIS — Z992 Dependence on renal dialysis: Secondary | ICD-10-CM | POA: Diagnosis not present

## 2023-07-15 DIAGNOSIS — E1021 Type 1 diabetes mellitus with diabetic nephropathy: Secondary | ICD-10-CM | POA: Diagnosis not present

## 2023-07-15 DIAGNOSIS — I5030 Unspecified diastolic (congestive) heart failure: Secondary | ICD-10-CM | POA: Diagnosis not present

## 2023-07-15 DIAGNOSIS — L821 Other seborrheic keratosis: Secondary | ICD-10-CM | POA: Diagnosis not present

## 2023-07-15 DIAGNOSIS — T148XXA Other injury of unspecified body region, initial encounter: Secondary | ICD-10-CM | POA: Diagnosis not present

## 2023-07-15 DIAGNOSIS — N2581 Secondary hyperparathyroidism of renal origin: Secondary | ICD-10-CM | POA: Diagnosis not present

## 2023-07-15 DIAGNOSIS — N186 End stage renal disease: Secondary | ICD-10-CM | POA: Diagnosis not present

## 2023-07-16 DIAGNOSIS — N2581 Secondary hyperparathyroidism of renal origin: Secondary | ICD-10-CM | POA: Diagnosis not present

## 2023-07-16 DIAGNOSIS — N186 End stage renal disease: Secondary | ICD-10-CM | POA: Diagnosis not present

## 2023-07-16 DIAGNOSIS — Z992 Dependence on renal dialysis: Secondary | ICD-10-CM | POA: Diagnosis not present

## 2023-07-16 DIAGNOSIS — I5030 Unspecified diastolic (congestive) heart failure: Secondary | ICD-10-CM | POA: Diagnosis not present

## 2023-07-18 ENCOUNTER — Other Ambulatory Visit: Payer: Self-pay

## 2023-07-18 ENCOUNTER — Encounter (HOSPITAL_COMMUNITY): Payer: Self-pay

## 2023-07-18 ENCOUNTER — Emergency Department (HOSPITAL_COMMUNITY)
Admission: EM | Admit: 2023-07-18 | Discharge: 2023-07-18 | Disposition: A | Discharge status: Home / Self Care | Payer: Medicare PPO | Attending: Emergency Medicine | Admitting: Emergency Medicine

## 2023-07-18 DIAGNOSIS — I132 Hypertensive heart and chronic kidney disease with heart failure and with stage 5 chronic kidney disease, or end stage renal disease: Secondary | ICD-10-CM | POA: Insufficient documentation

## 2023-07-18 DIAGNOSIS — I2510 Atherosclerotic heart disease of native coronary artery without angina pectoris: Secondary | ICD-10-CM | POA: Insufficient documentation

## 2023-07-18 DIAGNOSIS — R42 Dizziness and giddiness: Secondary | ICD-10-CM | POA: Insufficient documentation

## 2023-07-18 DIAGNOSIS — Z7982 Long term (current) use of aspirin: Secondary | ICD-10-CM | POA: Insufficient documentation

## 2023-07-18 DIAGNOSIS — I959 Hypotension, unspecified: Secondary | ICD-10-CM | POA: Diagnosis not present

## 2023-07-18 DIAGNOSIS — E876 Hypokalemia: Secondary | ICD-10-CM | POA: Diagnosis not present

## 2023-07-18 DIAGNOSIS — W19XXXA Unspecified fall, initial encounter: Secondary | ICD-10-CM | POA: Diagnosis not present

## 2023-07-18 DIAGNOSIS — R748 Abnormal levels of other serum enzymes: Secondary | ICD-10-CM | POA: Diagnosis not present

## 2023-07-18 DIAGNOSIS — I5030 Unspecified diastolic (congestive) heart failure: Secondary | ICD-10-CM | POA: Insufficient documentation

## 2023-07-18 DIAGNOSIS — N186 End stage renal disease: Secondary | ICD-10-CM | POA: Diagnosis not present

## 2023-07-18 DIAGNOSIS — E1122 Type 2 diabetes mellitus with diabetic chronic kidney disease: Secondary | ICD-10-CM | POA: Insufficient documentation

## 2023-07-18 DIAGNOSIS — Z992 Dependence on renal dialysis: Secondary | ICD-10-CM | POA: Diagnosis not present

## 2023-07-18 DIAGNOSIS — Z7901 Long term (current) use of anticoagulants: Secondary | ICD-10-CM | POA: Insufficient documentation

## 2023-07-18 DIAGNOSIS — R110 Nausea: Secondary | ICD-10-CM | POA: Diagnosis not present

## 2023-07-18 DIAGNOSIS — H81399 Other peripheral vertigo, unspecified ear: Secondary | ICD-10-CM

## 2023-07-18 DIAGNOSIS — R778 Other specified abnormalities of plasma proteins: Secondary | ICD-10-CM | POA: Diagnosis not present

## 2023-07-18 DIAGNOSIS — Z91040 Latex allergy status: Secondary | ICD-10-CM | POA: Insufficient documentation

## 2023-07-18 DIAGNOSIS — R7989 Other specified abnormal findings of blood chemistry: Secondary | ICD-10-CM | POA: Insufficient documentation

## 2023-07-18 DIAGNOSIS — R531 Weakness: Secondary | ICD-10-CM | POA: Diagnosis not present

## 2023-07-18 DIAGNOSIS — R739 Hyperglycemia, unspecified: Secondary | ICD-10-CM | POA: Diagnosis not present

## 2023-07-18 DIAGNOSIS — N2581 Secondary hyperparathyroidism of renal origin: Secondary | ICD-10-CM | POA: Diagnosis not present

## 2023-07-18 DIAGNOSIS — I120 Hypertensive chronic kidney disease with stage 5 chronic kidney disease or end stage renal disease: Secondary | ICD-10-CM | POA: Diagnosis not present

## 2023-07-18 LAB — COMPREHENSIVE METABOLIC PANEL
ALT: 14 U/L (ref 0–44)
AST: 23 U/L (ref 15–41)
Albumin: 3.4 g/dL — ABNORMAL LOW (ref 3.5–5.0)
Alkaline Phosphatase: 197 U/L — ABNORMAL HIGH (ref 38–126)
Anion gap: 11 (ref 5–15)
BUN: 40 mg/dL — ABNORMAL HIGH (ref 8–23)
CO2: 28 mmol/L (ref 22–32)
Calcium: 9.5 mg/dL (ref 8.9–10.3)
Chloride: 95 mmol/L — ABNORMAL LOW (ref 98–111)
Creatinine, Ser: 4.21 mg/dL — ABNORMAL HIGH (ref 0.44–1.00)
GFR, Estimated: 11 mL/min — ABNORMAL LOW (ref >60)
Glucose, Bld: 240 mg/dL — ABNORMAL HIGH (ref 70–99)
Potassium: 3.1 mmol/L — ABNORMAL LOW (ref 3.5–5.1)
Sodium: 134 mmol/L — ABNORMAL LOW (ref 135–145)
Total Bilirubin: 0.6 mg/dL (ref <1.2)
Total Protein: 6.7 g/dL (ref 6.5–8.1)

## 2023-07-18 LAB — CBC WITH DIFFERENTIAL/PLATELET
Abs Immature Granulocytes: 0.05 10*3/uL (ref 0.00–0.07)
Basophils Absolute: 0.1 10*3/uL (ref 0.0–0.1)
Basophils Relative: 1 %
Eosinophils Absolute: 0.1 10*3/uL (ref 0.0–0.5)
Eosinophils Relative: 2 %
HCT: 32 % — ABNORMAL LOW (ref 36.0–46.0)
Hemoglobin: 10 g/dL — ABNORMAL LOW (ref 12.0–15.0)
Immature Granulocytes: 1 %
Lymphocytes Relative: 11 %
Lymphs Abs: 0.9 10*3/uL (ref 0.7–4.0)
MCH: 30.8 pg (ref 26.0–34.0)
MCHC: 31.3 g/dL (ref 30.0–36.0)
MCV: 98.5 fL (ref 80.0–100.0)
Monocytes Absolute: 0.7 10*3/uL (ref 0.1–1.0)
Monocytes Relative: 9 %
Neutro Abs: 5.9 10*3/uL (ref 1.7–7.7)
Neutrophils Relative %: 76 %
Platelets: 69 10*3/uL — ABNORMAL LOW (ref 150–400)
RBC: 3.25 MIL/uL — ABNORMAL LOW (ref 3.87–5.11)
RDW: 14.6 % (ref 11.5–15.5)
WBC: 7.7 10*3/uL (ref 4.0–10.5)
nRBC: 0 % (ref 0.0–0.2)

## 2023-07-18 LAB — MAGNESIUM: Magnesium: 2.3 mg/dL (ref 1.7–2.4)

## 2023-07-18 LAB — TROPONIN I (HIGH SENSITIVITY)
Troponin I (High Sensitivity): 18 ng/L — ABNORMAL HIGH (ref <18)
Troponin I (High Sensitivity): 24 ng/L — ABNORMAL HIGH (ref <18)

## 2023-07-18 MED ORDER — POTASSIUM CHLORIDE CRYS ER 20 MEQ PO TBCR
40.0000 meq | EXTENDED_RELEASE_TABLET | Freq: Once | ORAL | Status: AC
  Administered 2023-07-18: 40 meq via ORAL
  Filled 2023-07-18: qty 4

## 2023-07-18 MED ORDER — MECLIZINE HCL 12.5 MG PO TABS
25.0000 mg | ORAL_TABLET | Freq: Once | ORAL | Status: AC
  Administered 2023-07-18: 25 mg via ORAL
  Filled 2023-07-18: qty 2

## 2023-07-18 MED ORDER — POTASSIUM CHLORIDE CRYS ER 20 MEQ PO TBCR: 20.0000 meq | EXTENDED_RELEASE_TABLET | Freq: Two times a day (BID) | ORAL | 0 refills | Status: DC

## 2023-07-18 MED ORDER — MECLIZINE HCL 25 MG PO TABS: 25.0000 mg | ORAL_TABLET | Freq: Two times a day (BID) | ORAL | 0 refills | Status: DC | PRN

## 2023-07-18 NOTE — ED Provider Notes (Signed)
[place] EMERGENCY DEPARTMENT AT [facility] Provider Note   CSN: [id number] Arrival date & time: 07/18/23  0354     History  Chief Complaint  Patient presents with   Weakness    [name] [name] is a 72 y.o. female.  The history is provided by the patient.  Weakness She has history of hypertension, diabetes, hyperlipidemia, end-stage renal disease on hemodialysis, diastolic heart failure, paroxysmal atrial fibrillation anticoagulated on apixaban, coronary artery disease comes in following an episode of extreme weakness and near syncope.  She states that her husband was helping her get to the bathroom when she felt like she was fading in and out and he was unable to hold her.  She did feel dizzy and had nauseous.  She denies chest pain, heaviness, tightness, pressure.  She is feeling somewhat better now.  She does endorse tinnitus during this episode.   Home Medications Prior to Admission medications   Medication Sig Start Date End Date Taking? Authorizing Provider  acyclovir ointment (ZOVIRAX) 5 % Apply 1 application topically every 3 (three) hours as needed (fever blister/cold sores).    [provider]  ALPRAZolam Prudy Feeler) 0.5 MG tablet Take 1 tablet (0.5 mg total) by mouth 2 (two) times daily as needed for anxiety. 01/15/18   Aliene Beams, MD  APIDRA 100 UNIT/ML injection Inject 40 Units into the skin as directed. INFUSE THROUGH INSULIN PUMP UTD, Bolus depends on carb intake 02/04/15   [provider]  apixaban (ELIQUIS) 5 MG TABS tablet Take 1 tablet (5 mg total) by mouth 2 (two) times daily. 07/12/23   Laurey Morale, MD  aspirin EC 81 MG tablet Take 81 mg by mouth at bedtime.    [provider]  cetirizine (ZYRTEC) 10 MG tablet Take 10 mg by mouth in the morning.    [provider]  clobetasol (OLUX) 0.05 % topical foam Apply 1 application  topically 2 (two) times daily as needed (skin flaking (legs)).    [provider]   Coenzyme Q10 (CO Q 10) 100 MG CAPS Take 100 mg by mouth at bedtime.     [provider]  colchicine 0.6 MG tablet Take 0.6 mg by mouth daily as needed (gout flare).    [provider]  denosumab (PROLIA) 60 MG/ML SOSY injection Inject 60 mg into the skin every 6 (six) months.    [provider]  diclofenac Sodium (VOLTAREN) 1 % GEL Apply 2 g topically 4 (four) times daily as needed (pain.).    [provider]  escitalopram (LEXAPRO) 5 MG tablet Take 5 mg by mouth at bedtime.    [provider]  famotidine (PEPCID) 40 MG tablet Take 40 mg by mouth at bedtime.    [provider]  gabapentin (NEURONTIN) 100 MG capsule Take 100 mg by mouth daily.    [provider]  glucagon (GLUCAGON EMERGENCY) 1 MG injection Inject 1 mg into the vein once as needed (low blood sugar).    [provider]  isosorbide mononitrate (IMDUR) 120 MG 24 hr tablet Take 1 tablet (120 mg total) by mouth every morning. 05/08/23   Laurey Morale, MD  latanoprost (XALATAN) 0.005 % ophthalmic solution Place 1 drop into both eyes at bedtime.    [provider]  metolazone (ZAROXOLYN) 2.5 MG tablet Take 2.5 mg (1 tab) once as directed by CHF clinic. Call before taking [phone] 05/31/21   Laurey Morale, MD  metoprolol succinate (TOPROL XL) 25  MG 24 hr tablet Take 0.5 tablets (12.5 mg total) by mouth daily. 05/22/23   Laurey Morale, MD  midodrine (PROAMATINE) 5 MG tablet Take 1 tablet (5 mg total) by mouth daily as needed (Prior to HD). 05/08/23   Laurey Morale, MD  mometasone (NASONEX) 50 MCG/ACT nasal spray Place 1 spray into the nose daily as needed (allergies). 03/10/20   [provider]  mupirocin cream (BACTROBAN) 2 % Apply 1 application topically 2 (two) times daily as needed. 02/11/18   Aliene Beams, MD  nitroGLYCERIN (NITROLINGUAL) 0.4 MG/SPRAY spray PLACE 1 SPRAY UNDER TH TONGUE EVERY 5 MINUTES FOR 3 DOSES AS NEEDED FOR CHEST  PAIN 12/25/22   Laurey Morale, MD  polyethylene glycol (MIRALAX / GLYCOLAX) 17 g packet Take 17 g by mouth in the morning. Hold for loose stools    [provider]  pravastatin (PRAVACHOL) 10 MG tablet TAKE ONE TABLET (10 MG TOTAL) BY MOUTH DAILY. 04/23/23   Laurey Morale, MD  Probiotic Product (ALIGN PO) Take 1 tablet by mouth daily in the afternoon.    [provider]  REPATHA SURECLICK 140 MG/ML SOAJ INJECT 140MG  INTO THE SKIN EVERY 14 DAYS 03/29/23   Laurey Morale, MD  torsemide [facility]) 20 MG tablet Take 40 mg Twice daily on Wed,sat and sun 05/08/23   Laurey Morale, MD  ULORIC 40 MG tablet Take 1 tablet (40 mg total) by mouth at bedtime. 11/29/17   Aliene Beams, MD  VASCEPA 1 g capsule TAKE TWO CAPSULES (2 GRAMS TOTAL) BY MOUTH TWO TIMES DAILY 02/26/23   Laurey Morale, MD      Allergies    Avandia [rosiglitazone maleate], Cephalexin, Clindamycin, Humalog [insulin lispro], Lincomycin, Metformin, Novolog [insulin aspart (human analog)], Penicillins, Tizanidine, Adhesive [tape], Atorvastatin, Cholestyramine, Gentamycin [gentamicin], Iodinated contrast media, Limonene, Prednisone, Ranexa [ranolazine], Rosuvastatin, Simvastatin, Zinc, Acetaminophen, Azelastine, Clopidogrel, Kerendia [finerenone], Lisinopril, Neosporin [bacitracin-polymyxin b], Codeine, Erythromycin, Hydrocodone-acetaminophen, Latex, Neomycin, Nickel, Ranitidine, Tamiflu [oseltamivir], and Tramadol    Review of Systems   Review of Systems  Neurological:  Positive for weakness.  All other systems reviewed and are negative.   Physical Exam Updated Vital Signs There were no vitals taken for this visit. Physical Exam Vitals and nursing note reviewed.   72 year old female, resting comfortably and in no acute distress. Vital signs are normal. Oxygen saturation is 98%, which is normal. Head is normocephalic and atraumatic. PERRLA, EOMI. Oropharynx is clear. Neck is nontender and supple without  adenopathy or JVD.  There are no carotid bruits. Back is nontender and there is no CVA tenderness. Lungs are clear without rales, wheezes, or rhonchi. Chest is nontender.  Dialysis access catheter present on the right. Heart has regular rate and rhythm with 2-3/6 systolic ejection murmur best heard along the left sternal border. Abdomen is soft, flat, nontender. Extremities have no cyanosis or edema, full range of motion is present. Skin is warm and dry without rash. Neurologic: Mental status is normal, cranial nerves are intact, moves all extremities equally.  There is no nystagmus.  Dizziness is reproduced by passive head movement.  ED Results / Procedures / Treatments   Labs (all labs ordered are listed, but only abnormal results are displayed) Labs Reviewed  COMPREHENSIVE METABOLIC PANEL - Abnormal; Notable for the following components:      Result Value   Sodium 134 (*)    Potassium 3.1 (*)    Chloride 95 (*)    Glucose, Bld 240 (*)  BUN 40 (*)    Creatinine, Ser 4.21 (*)    Albumin 3.4 (*)    Alkaline Phosphatase 197 (*)    GFR, Estimated 11 (*)    All other components within normal limits  CBC WITH DIFFERENTIAL/PLATELET - Abnormal; Notable for the following components:   RBC 3.25 (*)    Hemoglobin 10.0 (*)    HCT 32.0 (*)    Platelets 69 (*)    All other components within normal limits  TROPONIN I (HIGH SENSITIVITY) - Abnormal; Notable for the following components:   Troponin I (High Sensitivity) 18 (*)    All other components within normal limits  TROPONIN I (HIGH SENSITIVITY) - Abnormal; Notable for the following components:   Troponin I (High Sensitivity) 24 (*)    All other components within normal limits  MAGNESIUM    EKG EKG Interpretation Date/Time:  Thursday July 18 2023 04:13:40 EST Ventricular Rate:  86 PR Interval:  218 QRS Duration:  95 QT Interval:  421 QTC Calculation: 504 R Axis:   -42  Text Interpretation: Sinus rhythm Borderline  prolonged PR interval Probable left atrial enlargement Left anterior fascicular block Abnormal R-wave progression, late transition LVH with secondary repolarization abnormality Prolonged QT interval When compared with ECG of 07/12/2023, No significant change was found Confirmed by Dione Booze ([id number]) on 07/18/2023 4:21:58 AM  Procedures Procedures  Cardiac monitor shows sinus rhythm with occasional PVC, per my interpretation.  Medications Ordered in ED Medications  meclizine (ANTIVERT) tablet 25 mg (25 mg Oral Given 07/18/23 0447)  potassium chloride SA (KLOR-CON M) CR tablet 40 mEq (40 mEq Oral Given 07/18/23 6213)    ED Course/ Medical Decision Making/ A&P                                 Medical Decision Making Amount and/or Complexity of Data Reviewed Labs: ordered.  Risk Prescription drug management.   Episode of dizziness which sounds like peripheral vertigo, consider central vertigo.  I have reviewed her electrocardiogram, and my interpretation is sinus rhythm with left anterior fascicular block and LVH with secondary repolarization changes unchanged from prior.  I have ordered laboratory workup of CBC, comprehensive metabolic panel, troponin x 2.  I have ordered a dose of meclizine.  I have reviewed her laboratory tests, and my interpretation is mild hypokalemia, mild hyponatremia which is probably not clinically significant, elevated BUN and creatinine consistent with known history of end-stage renal disease, elevated alkaline phosphatase also related to end-stage renal disease, mildly elevated troponin in similar range she has been at previously and stable, no evidence of ACS.  She had good clinical response to meclizine, no longer feels dizzy.  I will try to ambulate her to see if she is safe for discharge.  Patient was able to ambulate with assistance, felt that she was safe to go home.  I am discharging her with a prescription for meclizine, advised to return for worsening  symptoms.  Final Clinical Impression(s) / ED Diagnoses Final diagnoses:  Peripheral vertigo, unspecified laterality  End-stage renal disease on hemodialysis (HCC)  Hypokalemia  Elevated troponin  Elevated serum alkaline phosphatase level    Rx / DC Orders ED Discharge Orders          Ordered    potassium chloride SA (KLOR-CON M) 20 MEQ tablet  2 times daily        07/18/23 0726    meclizine (ANTIVERT) 25 MG  tablet  2 times daily PRN        07/18/23 0726              Dione Booze, MD 07/18/23 [id number]

## 2023-07-18 NOTE — ED Triage Notes (Signed)
Patient arrives with [facility] from home after husband found patient on the bathroom floor. Patient has hx of dialysis and is due to have dialysis today; dialysis schedule is [facility], Thur, and Friday; patient reports falling 2 weeks ago and states that "she hit her head, left shoulder, and left hip." Patient is on eliquiS. Patient has below left eye; pt reports pain above left eye.  EMS vitals:  92/51 87% on room air- placed on 3L O2, O2 at 100% CBG 282

## 2023-07-18 NOTE — ED Notes (Signed)
ED Provider at bedside. 

## 2023-07-18 NOTE — Discharge Instructions (Addendum)
Go to your dialysis session.  You may take meclizine as often as twice a day as needed for dizziness.  Return if you have any new or concerning symptoms.

## 2023-07-18 NOTE — ED Notes (Signed)
Pt was ambulated to the bathroom with assistance, pt stated she felt wobbly. Her O2 level was 91 on room air. Patient stated she did feel a little SOB while walking. Definitely will need a stand by assist for ambulation.

## 2023-07-19 DIAGNOSIS — N2581 Secondary hyperparathyroidism of renal origin: Secondary | ICD-10-CM | POA: Diagnosis not present

## 2023-07-19 DIAGNOSIS — Z992 Dependence on renal dialysis: Secondary | ICD-10-CM | POA: Diagnosis not present

## 2023-07-19 DIAGNOSIS — N186 End stage renal disease: Secondary | ICD-10-CM | POA: Diagnosis not present

## 2023-07-19 DIAGNOSIS — I5030 Unspecified diastolic (congestive) heart failure: Secondary | ICD-10-CM | POA: Diagnosis not present

## 2023-07-22 ENCOUNTER — Encounter (HOSPITAL_COMMUNITY): Payer: Self-pay | Admitting: Cardiology

## 2023-07-22 ENCOUNTER — Encounter: Payer: Self-pay | Admitting: Physician Assistant

## 2023-07-22 ENCOUNTER — Ambulatory Visit (INDEPENDENT_AMBULATORY_CARE_PROVIDER_SITE_OTHER)
Admission: RE | Admit: 2023-07-22 | Discharge: 2023-07-22 | Disposition: A | Discharge status: Home / Self Care | Payer: Medicare PPO | Source: Ambulatory Visit | Attending: Surgery | Admitting: Surgery

## 2023-07-22 ENCOUNTER — Ambulatory Visit: Payer: Medicare PPO | Admitting: Physician Assistant

## 2023-07-22 VITALS — BP 124/80 | HR 87 | Temp 98.0°F

## 2023-07-22 DIAGNOSIS — F39 Unspecified mood [affective] disorder: Secondary | ICD-10-CM | POA: Diagnosis present

## 2023-07-22 DIAGNOSIS — I38 Endocarditis, valve unspecified: Secondary | ICD-10-CM | POA: Diagnosis not present

## 2023-07-22 DIAGNOSIS — I480 Paroxysmal atrial fibrillation: Secondary | ICD-10-CM | POA: Diagnosis present

## 2023-07-22 DIAGNOSIS — K625 Hemorrhage of anus and rectum: Secondary | ICD-10-CM | POA: Diagnosis not present

## 2023-07-22 DIAGNOSIS — N186 End stage renal disease: Secondary | ICD-10-CM

## 2023-07-22 DIAGNOSIS — F05 Delirium due to known physiological condition: Secondary | ICD-10-CM | POA: Diagnosis not present

## 2023-07-22 DIAGNOSIS — E8779 Other fluid overload: Secondary | ICD-10-CM | POA: Diagnosis not present

## 2023-07-22 DIAGNOSIS — I509 Heart failure, unspecified: Secondary | ICD-10-CM | POA: Diagnosis not present

## 2023-07-22 DIAGNOSIS — J984 Other disorders of lung: Secondary | ICD-10-CM | POA: Diagnosis not present

## 2023-07-22 DIAGNOSIS — R410 Disorientation, unspecified: Secondary | ICD-10-CM | POA: Diagnosis not present

## 2023-07-22 DIAGNOSIS — I5032 Chronic diastolic (congestive) heart failure: Secondary | ICD-10-CM | POA: Diagnosis present

## 2023-07-22 DIAGNOSIS — Z681 Body mass index (BMI) 19 or less, adult: Secondary | ICD-10-CM | POA: Diagnosis not present

## 2023-07-22 DIAGNOSIS — Z4682 Encounter for fitting and adjustment of non-vascular catheter: Secondary | ICD-10-CM | POA: Diagnosis not present

## 2023-07-22 DIAGNOSIS — R9431 Abnormal electrocardiogram [ECG] [EKG]: Secondary | ICD-10-CM | POA: Diagnosis not present

## 2023-07-22 DIAGNOSIS — Z1152 Encounter for screening for COVID-19: Secondary | ICD-10-CM | POA: Diagnosis not present

## 2023-07-22 DIAGNOSIS — N2581 Secondary hyperparathyroidism of renal origin: Secondary | ICD-10-CM | POA: Diagnosis present

## 2023-07-22 DIAGNOSIS — Z992 Dependence on renal dialysis: Secondary | ICD-10-CM

## 2023-07-22 DIAGNOSIS — J189 Pneumonia, unspecified organism: Secondary | ICD-10-CM | POA: Diagnosis not present

## 2023-07-22 DIAGNOSIS — R0902 Hypoxemia: Secondary | ICD-10-CM | POA: Diagnosis not present

## 2023-07-22 DIAGNOSIS — D631 Anemia in chronic kidney disease: Secondary | ICD-10-CM | POA: Diagnosis present

## 2023-07-22 DIAGNOSIS — R4182 Altered mental status, unspecified: Secondary | ICD-10-CM | POA: Diagnosis not present

## 2023-07-22 DIAGNOSIS — I959 Hypotension, unspecified: Secondary | ICD-10-CM | POA: Diagnosis not present

## 2023-07-22 DIAGNOSIS — K7581 Nonalcoholic steatohepatitis (NASH): Secondary | ICD-10-CM | POA: Diagnosis not present

## 2023-07-22 DIAGNOSIS — J90 Pleural effusion, not elsewhere classified: Secondary | ICD-10-CM | POA: Diagnosis not present

## 2023-07-22 DIAGNOSIS — Z953 Presence of xenogenic heart valve: Secondary | ICD-10-CM | POA: Diagnosis not present

## 2023-07-22 DIAGNOSIS — R042 Hemoptysis: Secondary | ICD-10-CM | POA: Diagnosis not present

## 2023-07-22 DIAGNOSIS — J690 Pneumonitis due to inhalation of food and vomit: Secondary | ICD-10-CM | POA: Diagnosis not present

## 2023-07-22 DIAGNOSIS — G2581 Restless legs syndrome: Secondary | ICD-10-CM | POA: Diagnosis present

## 2023-07-22 DIAGNOSIS — J101 Influenza due to other identified influenza virus with other respiratory manifestations: Secondary | ICD-10-CM | POA: Diagnosis not present

## 2023-07-22 DIAGNOSIS — I495 Sick sinus syndrome: Secondary | ICD-10-CM | POA: Diagnosis present

## 2023-07-22 DIAGNOSIS — E871 Hypo-osmolality and hyponatremia: Secondary | ICD-10-CM | POA: Diagnosis present

## 2023-07-22 DIAGNOSIS — G4731 Primary central sleep apnea: Secondary | ICD-10-CM | POA: Diagnosis not present

## 2023-07-22 DIAGNOSIS — E1022 Type 1 diabetes mellitus with diabetic chronic kidney disease: Secondary | ICD-10-CM | POA: Diagnosis present

## 2023-07-22 DIAGNOSIS — J9811 Atelectasis: Secondary | ICD-10-CM | POA: Diagnosis not present

## 2023-07-22 DIAGNOSIS — J1001 Influenza due to other identified influenza virus with the same other identified influenza virus pneumonia: Secondary | ICD-10-CM | POA: Diagnosis present

## 2023-07-22 DIAGNOSIS — Z955 Presence of coronary angioplasty implant and graft: Secondary | ICD-10-CM | POA: Diagnosis not present

## 2023-07-22 DIAGNOSIS — K7460 Unspecified cirrhosis of liver: Secondary | ICD-10-CM | POA: Diagnosis present

## 2023-07-22 DIAGNOSIS — A419 Sepsis, unspecified organism: Secondary | ICD-10-CM | POA: Diagnosis not present

## 2023-07-22 DIAGNOSIS — R1310 Dysphagia, unspecified: Secondary | ICD-10-CM | POA: Diagnosis present

## 2023-07-22 DIAGNOSIS — J9601 Acute respiratory failure with hypoxia: Secondary | ICD-10-CM | POA: Diagnosis present

## 2023-07-22 DIAGNOSIS — G9341 Metabolic encephalopathy: Secondary | ICD-10-CM | POA: Diagnosis present

## 2023-07-22 DIAGNOSIS — R0989 Other specified symptoms and signs involving the circulatory and respiratory systems: Secondary | ICD-10-CM | POA: Diagnosis not present

## 2023-07-22 DIAGNOSIS — R531 Weakness: Secondary | ICD-10-CM | POA: Diagnosis present

## 2023-07-22 DIAGNOSIS — I700 Atherosclerosis of aorta: Secondary | ICD-10-CM | POA: Diagnosis not present

## 2023-07-22 DIAGNOSIS — R471 Dysarthria and anarthria: Secondary | ICD-10-CM | POA: Diagnosis not present

## 2023-07-22 DIAGNOSIS — A4189 Other specified sepsis: Secondary | ICD-10-CM | POA: Diagnosis present

## 2023-07-22 DIAGNOSIS — I120 Hypertensive chronic kidney disease with stage 5 chronic kidney disease or end stage renal disease: Secondary | ICD-10-CM | POA: Diagnosis not present

## 2023-07-22 DIAGNOSIS — J918 Pleural effusion in other conditions classified elsewhere: Secondary | ICD-10-CM | POA: Diagnosis present

## 2023-07-22 DIAGNOSIS — I132 Hypertensive heart and chronic kidney disease with heart failure and with stage 5 chronic kidney disease, or end stage renal disease: Secondary | ICD-10-CM | POA: Diagnosis present

## 2023-07-22 DIAGNOSIS — R059 Cough, unspecified: Secondary | ICD-10-CM | POA: Diagnosis not present

## 2023-07-22 DIAGNOSIS — R918 Other nonspecific abnormal finding of lung field: Secondary | ICD-10-CM | POA: Diagnosis not present

## 2023-07-22 DIAGNOSIS — K921 Melena: Secondary | ICD-10-CM | POA: Diagnosis not present

## 2023-07-22 NOTE — Progress Notes (Signed)
Office Note     CC:  follow up Requesting Provider:  Aliene Beams, MD  HPI: ACASIA HELM is a 72 y.o. (12-12-1950) female who presents status post right first stage basilic vein fistula creation and right IJ [facility] placement on 05/01/2023 by Dr. Myra Gianotti.  Incision of the right arm is well-healed.  She denies signs or symptoms of steal syndrome in her right hand.  TDC is functioning well on dialysis.  She is currently dialyzing on a Monday, Tuesday, Thursday, Friday schedule.  She is on Eliquis for atrial fibrillation.   Past Medical History:  Diagnosis Date   Anxiety    Aortic stenosis    a. bicuspid aortic valve; mean gradient of 20 mmHg in 2/10; b. s/p bioprosth AVR (19-mm Edwards pericardial valve) with a redo coronary artery bypass graft procedure in 07/2009; postoperative Dressler's syndrome     Arthritis    Breast cancer (HCC)    Carcinoma, renal cell (HCC) 05/2007   Laparoscopic right nephrectomy   Chronic diastolic CHF (congestive heart failure) (HCC)    a. 9.2014 EchoP EF 60-65%, no rwma, bioprosth AVR, mean gradient of , mild to mod MR, PASP .   CKD (chronic kidney disease), stage IV (HCC)    Colitis, ischemic (HCC) 2008   Complication of anesthesia    Coronary artery disease    a. initial CABG with SVG-RCA in 12/05. b. redo SVG-RCA and bioprosthetic AVR in 1/11. d. 9/14 - PCI with DES to distal LM/proximal LAD was done rather than bypass as she would have been a poor candidate for redo sternotomy. d. S/p DES to prox LM 05/2014.   Diabetes mellitus 03/2010    TYPE II: Hemoglobin A1c of 7.4 in  03/2010; 8.4 in 06/2010; treated with insulin pump   Duodenal ulcer    Remote; H. pylori positive   Dyspnea    Gastroparesis due to DM (HCC) 05/01/2012   Glaucoma 1998   Hodgkin's disease (HCC) 1991   Mantle radiation therapy   Hyperlipidemia    Hyperplastic gastric polyp 06/23/2012   Hypertension    Iron deficiency anemia    a. 03/2013 EGD: essentially normal.  Possible slow GIB.   Migraines    NASH (nonalcoholic steatohepatitis) 1999   -biopsy in 1999   OSA treated with BiPAP    Osteoporosis    Pacemaker    six sinus syndrome   PAF (paroxysmal atrial fibrillation) (HCC)    a.  h/o brief episodes noted on ppm interrogation. b. given GI bleeding and need to be on both ASA 81 and Brilinta, risks likely outweigh benefits of anticoagulation.    PONV (postoperative nausea and vomiting)    Small bowel obstruction (HCC)    Recurrent; resolved after resection of a lipoma   Syncope    a. H/o recurrent syncope with pauses on loop recorder s/p Medtronic pacemaker 07/2012.    Past Surgical History:  Procedure Laterality Date   ABDOMINAL HYSTERECTOMY  2000   AORTIC VALVE REPLACEMENT  2011   Aortic valve replacement surgery with a 19 mm bioprosthetic device post redo CABG January 2011.   AV FISTULA PLACEMENT Right 05/01/2023   Procedure: RIGHT UPPER ARM BRACHIOBASILIC ARTERIOVENOUS FISTULA CREATION;  Surgeon: Nada Libman, MD;  Location: [facility] OR;  Service: Vascular;  Laterality: Right;   BONE MARROW BIOPSY     BREAST EXCISIONAL BIOPSY     benign, bilateral   CARDIAC CATHETERIZATION     CERVICAL BIOPSY     COLONOSCOPY  Multiple with adenomatous polyps   COLONOSCOPY  06/23/2012   Procedure: COLONOSCOPY;  Surgeon: Iva Boop, MD;  Location: [facility] ENDOSCOPY;  Service: Endoscopy;  Laterality: N/A;   CORONARY ARTERY BYPASS GRAFT  2005, 2011   CABG-most recent SVG to RCA-12/05; RCA occluded with patent graft in 2006; Redo bypass in 07/2009   ESOPHAGOGASTRODUODENOSCOPY     ESOPHAGOGASTRODUODENOSCOPY  06/23/2012   Procedure: ESOPHAGOGASTRODUODENOSCOPY (EGD);  Surgeon: Iva Boop, MD;  Location: [name] END[facility]COPY;  Service: Endoscopy;  Laterality: N/A;   ESOPHAGOGASTRODUODENOSCOPY N/A 04/06/2013   Procedure: ESOPHAGOGASTRODUODENOSCOPY (EGD);  Surgeon: Hilarie Fredrickson, MD;  Location[facility] ENDOSCOPY;  Service: Endoscopy;  Laterality: N/A;   EXPLORATORY  LAPAROTOMY W/ BOWEL RESECTION     Small bowel resection of lipoma   GIVENS CAPSULE STUDY N/A 04/30/2013   Procedure: GIVENS CAPSULE STUDY;  Surgeon: Iva Boop, MD;  Location: [facility] ENDOSCOPY;  Service: Endoscopy;  Laterality: N/A;   INSERTION OF DIALYSIS CATHETER Right 05/01/2023   Procedure: INSERTION OF TUNNELED DIALYSIS CATHETER USING PALINDROME CATHETER 23CM;  Surgeon: Nada Libman, MD;  Location: [facility] OR;  Service: Vascular;  Laterality: Right;   LEFT AND RIGHT HEART CATHETERIZATION WITH CORONARY ANGIOGRAM N/A 04/07/2013   Procedure: LEFT AND RIGHT HEART CATHETERIZATION WITH CORONARY ANGIOGRAM;  Surgeon: Laurey Morale, MD;  Location: [facility] CATH LAB;  Service: Cardiovascular;  Laterality: N/A;   LEFT ATRIAL APPENDAGE OCCLUSION N/A 02/16/2016   Watchman not placed - nickel allergy   LEFT HEART CATHETERIZATION WITH CORONARY/GRAFT ANGIOGRAM N/A 06/07/2014   Procedure: LEFT HEART CATHETERIZATION WITH Isabel Caprice;  Surgeon: Kathleene Hazel, MD;  Location: [facility] CATH LAB;  Service: Cardiovascular;  Laterality: N/A;   LIVER BIOPSY     LOOP RECORDER IMPLANT N/A 12/10/2011   Procedure: LOOP RECORDER IMPLANT;  Surgeon: Marinus Maw, MD;  Location: [facility] CATH LAB;  Service: Cardiovascular;  Laterality: N/A;   MALONEY DILATION  06/23/2012   Procedure: Elease Hashimoto DILATION;  Surgeon: Iva Boop, MD;  Location: [facility] ENDOSCOPY;  Service: Endoscopy;;  54 fr   MASTECTOMY Left 05/17/2015   total - no lymph nodes removed per patient   NEPHRECTOMY  2008   Laparoscopic right nephrectomy, renal cell carcinoma   PACEMAKER INSERTION  08/21/2012   Medtronic Adapta L dual-chamber pacemaker   PERCUTANEOUS CORONARY STENT INTERVENTION (PCI-S) N/A 04/09/2013   Procedure: PERCUTANEOUS CORONARY STENT INTERVENTION (PCI-S);  Surgeon: Kathleene Hazel, MD;  Location: [facility] CATH LAB;  Service: Cardiovascular;  Laterality: N/A;   PERMANENT PACEMAKER INSERTION N/A 08/21/2012   Procedure: PERMANENT  PACEMAKER INSERTION;  Surgeon: Marinus Maw, MD;  Location: [facility] CATH LAB;  Service: Cardiovascular;  Laterality: N/A;   RIGHT HEART CATH N/A 03/18/2019   Procedure: RIGHT HEART CATH;  Surgeon: Laurey Morale, MD;  Location: [facility] INVASIVE CV LAB;  Service: Cardiovascular;  Laterality: N/A;   RIGHT HEART CATH N/A 08/21/2022   Procedure: RIGHT HEART CATH;  Surgeon: Laurey Morale, MD;  Location: [facility] INVASIVE CV LAB;  Service: Cardiovascular;  Laterality: N/A;   RIGHT HEART CATH AND CORONARY/GRAFT ANGIOGRAPHY N/A 04/19/2017   Procedure: RIGHT HEART CATH AND CORONARY/GRAFT ANGIOGRAPHY;  Surgeon: Laurey Morale, MD;  Location: [facility] INVASIVE CV LAB;  Service: Cardiovascular;  Laterality: N/A;   TEE WITHOUT CARDIOVERSION N/A 12/07/2015   Procedure: TRANSESOPHAGEAL ECHOCARDIOGRAM (TEE);  Surgeon: Laurey Morale, MD;  Location: [facility] ENDOSCOPY;  Service: Cardiovascular;  Laterality: N/A;   TOTAL ABDOMINAL HYSTERECTOMY W/ BILATERAL SALPINGOOPHORECTOMY  2000   endometriosis and ovarian cysts  TOTAL MASTECTOMY Left 05/17/2015   Procedure: LEFT TOTAL MASTECTOMY;  Surgeon: Emelia Loron, MD;  Location: [facility] OR;  Service: General;  Laterality: Left;   TUMOR EXCISION  1981   removal of Hodgkins lymphoma    Social History   Socioeconomic History   Marital status: Married    Spouse name: Not on file   Number of children: 2   Years of education: Not on file   Highest education level: Doctorate  Occupational History   Occupation: retired    Comment: elementary principal  Tobacco Use   Smoking status: Never   Smokeless tobacco: Never  Vaping Use   Vaping status: Never Used  Substance and Sexual Activity   Alcohol use: No    Comment: social, 65s   Drug use: Never   Sexual activity: Yes  Other Topics Concern   Not on file  Social History Narrative   Retired Programmer, multimedia principal, married    Started disability September 2013      Has one daughter in [place], works at [employer]. Has a son,  Arlys John, who is Press photographer.   Eats all food groups.   Wear seatbelt.    Attends church. [place], [place]. [place].    Social Drivers of Corporate investment banker Strain: Low Risk  (01/02/2023)   Received from [facility], [facility]   Overall Financial Resource Strain (CARDIA)    Difficulty of Paying Living Expenses: Not hard at all  Food Insecurity: No Food Insecurity (01/02/2023)   Received from [facility], [facility]   Hunger Vital Sign    Worried About Running Out of Food in the Last Year: Never true    Ran Out of Food in the Last Year: Never true  Transportation Needs: No Transportation Needs (01/02/2023)   Received from [facility], [facility]   [facility] - Transportation    In the past 12 months, has lack of transportation kept you from medical appointments or from getting medications?: No    Lack of Transportation (Non-Medical): No  Physical Activity: Insufficiently Active (03/17/2021)   Received from [facility]   Exercise Vital Sign  Stress: Stress Concern Present (03/17/2021)   Received from [facility], [facility]   [employer] of Occupational Health - Occupational Stress Questionnaire    Feeling of Stress : To some extent  Social Connections: Not on file  Intimate Partner Violence: Not on file    Family History  Problem Relation Age of Onset   Heart attack Mother        d.61   Heart disease Mother    Anal fissures Mother    Heart attack Father        d.60   Diabetes Father    Heart disease Father    Lung cancer Sister        d.61 metastases to brain. History of smoking. Maternal half-sister.   Cancer Sister 30       unspecified gynecologic cancer (either cervical or ovarian) in her 30s.   Breast cancer Sister 73       d.42 from metastatic disease. Paternal half-sister.   Hypertension  Brother    Glaucoma Brother    Parkinson's disease Paternal Aunt    Heart attack Maternal Grandmother    Cancer Maternal Grandfather 50       d.52s oral cancer   Colon cancer Neg Hx  Stomach cancer Neg Hx    Parkinsonism Neg Hx     Current Outpatient Medications  Medication Sig Dispense Refill   acyclovir ointment (ZOVIRAX) 5 % Apply 1 application topically every 3 (three) hours as needed (fever blister/cold sores).     ALPRAZolam (XANAX) 0.5 MG tablet Take 1 tablet (0.5 mg total) by mouth 2 (two) times daily as needed for anxiety. 60 tablet 1   APIDRA 100 UNIT/ML injection Inject 40 Units into the skin as directed. INFUSE THROUGH INSULIN PUMP UTD, Bolus depends on carb intake  2   apixaban (ELIQUIS) 5 MG TABS tablet Take 1 tablet (5 mg total) by mouth 2 (two) times daily. 60 tablet 11   aspirin EC 81 MG tablet Take 81 mg by mouth at bedtime.     cetirizine (ZYRTEC) 10 MG tablet Take 10 mg by mouth in the morning.     clobetasol (OLUX) 0.05 % topical foam Apply 1 application  topically 2 (two) times daily as needed (skin flaking (legs)).     Coenzyme Q10 (CO Q 10) 100 MG CAPS Take 100 mg by mouth at bedtime.      colchicine 0.6 MG tablet Take 0.6 mg by mouth daily as needed (gout flare).     denosumab (PROLIA) 60 MG/ML SOSY injection Inject 60 mg into the skin every 6 (six) months.     diclofenac Sodium (VOLTAREN) 1 % GEL Apply 2 g topically 4 (four) times daily as needed (pain.).     escitalopram (LEXAPRO) 5 MG tablet Take 5 mg by mouth at bedtime.     famotidine (PEPCID) 40 MG tablet Take 40 mg by mouth at bedtime.     gabapentin (NEURONTIN) 100 MG capsule Take 100 mg by mouth daily.     glucagon (GLUCAGON EMERGENCY) 1 MG injection Inject 1 mg into the vein once as needed (low blood sugar).     isosorbide mononitrate (IMDUR) 120 MG 24 hr tablet Take 1 tablet (120 mg total) by mouth every morning.     latanoprost (XALATAN) 0.005 % ophthalmic solution Place 1 drop into both eyes at  bedtime.     meclizine (ANTIVERT) 25 MG tablet Take 1 tablet (25 mg total) by mouth 2 (two) times daily as needed for dizziness. 20 tablet 0   metolazone (ZAROXOLYN) 2.5 MG tablet Take 2.5 mg (1 tab) once as directed by CHF clinic. Call before taking [phone] 3 tablet 0   metoprolol succinate (TOPROL XL) 25 MG 24 hr tablet Take 0.5 tablets (12.5 mg total) by mouth daily. 45 tablet 3   midodrine (PROAMATINE) 5 MG tablet Take 1 tablet (5 mg total) by mouth daily as needed (Prior to HD). 30 tablet 3   mometasone (NASONEX) 50 MCG/ACT nasal spray Place 1 spray into the nose daily as needed (allergies).     mupirocin cream (BACTROBAN) 2 % Apply 1 application topically 2 (two) times daily as needed. 90 g 0   nitroGLYCERIN (NITROLINGUAL) 0.4 MG/SPRAY spray PLACE 1 SPRAY UNDER TH TONGUE EVERY 5 MINUTES FOR 3 DOSES AS NEEDED FOR CHEST PAIN 4.9 g 5   polyethylene glycol (MIRALAX / GLYCOLAX) 17 g packet Take 17 g by mouth in the morning. Hold for loose stools     potassium chloride SA (KLOR-CON M) 20 MEQ tablet Take 1 tablet (20 mEq total) by mouth 2 (two) times daily. 6 tablet 0   pravastatin (PRAVACHOL) 10 MG tablet TAKE ONE TABLET (10 MG TOTAL) BY MOUTH DAILY. 90 tablet 3  Probiotic Product (ALIGN PO) Take 1 tablet by mouth daily in the afternoon.     REPATHA SURECLICK 140 MG/ML SOAJ INJECT 140MG  INTO THE SKIN EVERY 14 DAYS 6 mL 3   torsemide (DEMADEX) 20 MG tablet Take 40 mg Twice daily on Wed,sat and sun 64 tablet 3   ULORIC 40 MG tablet Take 1 tablet (40 mg total) by mouth at bedtime. 90 tablet 3   VASCEPA 1 g capsule TAKE TWO CAPSULES (2 GRAMS TOTAL) BY MOUTH TWO TIMES DAILY 360 capsule 3   No current facility-administered medications for this visit.    Allergies  Allergen Reactions   Avandia [Rosiglitazone Maleate] Other (See Comments)    Congestive heart failure   Cephalexin Anaphylaxis, Swelling and Rash    Extremities swell , throat swelling    Clindamycin Anaphylaxis and Shortness Of  Breath    Other reaction(s): esophageal spasms   Humalog [Insulin Lispro] Itching     big knots and whelp    Lincomycin Anaphylaxis and Shortness Of Breath   Metformin Other (See Comments)    Congestive heart failure   Novolog [Insulin Aspart (Human Analog)] Hives and Itching    Humalog & Novolog big knots and whelp     Penicillins Anaphylaxis, Hives, Swelling and Rash    Did it involve swelling of the face/tongue/throat, SOB, or low BP? Yes Did it involve sudden or severe rash/hives, skin peeling, or any reaction on the inside of your mouth or nose? No Did you need to seek medical attention at a hospital or doctor's office? Yes When did it last happen?      Childhood allergy  If all above answers are "NO", may proceed with cephalosporin use.    Tizanidine Other (See Comments)    Dizziness, Mental Status Changes, Hallucination     Adhesive [Tape] Other (See Comments)    Tears skin, Please use "paper" tape   Atorvastatin Other (See Comments)    increased LFT's, no muscle weakness, leg pain Muscle and joint pain increased LFT's  Increased LFTs   Cholestyramine Other (See Comments)    Liver Disorder Elevated LFTs     Gentamycin [Gentamicin] Hives, Itching and Rash    Fine red bumps.  Reaction noted post loop recorder implant, where gentamycin was used for irrigation. Topical prep   Iodinated Contrast Media Other (See Comments)    PAIN DURING LYMPHANGIOGRAM '81, NO ALLERGY TO IV CONTRAST.  Has had procedures with Iodine since without problems.    Limonene Hives, Itching, Rash and Other (See Comments)    Reaction noted post loop recorder implant, where gentamycin was used for irrigation. Topical prep   Prednisone Itching and Other (See Comments)    B/P went high, itching all over.  Tolerates with benadryl    Ranexa [Ranolazine] Other (See Comments)    Nausea, dizziness, low blood sugar, tingling in right hand, "blood blisters" on tongue    Rosuvastatin Nausea And Vomiting,  Nausea Only and Other (See Comments)    Muscle and joint pain & increased LFTs; tolerates Pravastatin 10mg  (max) Other reaction(s): elevated LFTs   Simvastatin Other (See Comments)    Increased LFT's    Zinc     Other Reaction(s): Unknown   Acetaminophen     Pt told not to take tylenol due to liver issues   Azelastine Other (See Comments)    Other reaction(s): metallic taste,spitting up blood   Clopidogrel Other (See Comments)    Does not work for patient Medicine was not effective  Other reaction(s): Unknown   Kerendia [Finerenone] Hives and Nausea Only   Lisinopril Diarrhea, Nausea Only and Other (See Comments)   Neosporin [Bacitracin-Polymyxin B] Dermatitis   Codeine Nausea And Vomiting   Erythromycin Nausea And Vomiting   Hydrocodone-Acetaminophen Itching, Rash and Other (See Comments)    itching   Latex Itching and Other (See Comments)    I"if wear gloves hands get red ,itchy no prioblem if other wear and touch me"   Neomycin Rash and Other (See Comments)    Redness   Nickel Itching   Ranitidine Nausea Only   Tamiflu [Oseltamivir] Nausea And Vomiting   Tramadol Nausea Only     REVIEW OF SYSTEMS:   [X]  denotes positive finding, [ ]  denotes negative finding Cardiac  Comments:  Chest pain or chest pressure:    Shortness of breath upon exertion:    Short of breath when lying flat:    Irregular heart rhythm:        Vascular    Pain in calf, thigh, or hip brought on by ambulation:    Pain in feet at night that wakes you up from your sleep:     Blood clot in your veins:    Leg swelling:         Pulmonary    Oxygen at home:    Productive cough:     Wheezing:         Neurologic    Sudden weakness in arms or legs:     Sudden numbness in arms or legs:     Sudden onset of difficulty speaking or slurred speech:    Temporary loss of vision in one eye:     Problems with dizziness:         Gastrointestinal    Blood in stool:     Vomited blood:          Genitourinary    Burning when urinating:     Blood in urine:        Psychiatric    Major depression:         Hematologic    Bleeding problems:    Problems with blood clotting too easily:        Skin    Rashes or ulcers:        Constitutional    Fever or chills:      PHYSICAL EXAMINATION:  Vitals:   07/22/23 1337  BP: 124/80  Pulse: 87  Temp: 98 F (36.7 C)  TempSrc: Temporal  SpO2: 92%    General:  WDWN in NAD; vital signs documented above Gait: Not observed HENT: WNL, normocephalic Pulmonary: normal non-labored breathing , without Rales, rhonchi,  wheezing Cardiac: regular HR Abdomen: soft, NT, no masses Skin: without rashes Vascular Exam/Pulses: Palpable 1+ right radial pulse Extremities: Palpable thrill in the basilic vein of the right upper arm Musculoskeletal: no muscle wasting or atrophy  Neurologic: A&O X 3 Psychiatric:  The pt has Normal affect.   Non-Invasive Vascular Imaging:   Patent right basilic vein fistula on duplex with diameter greater than 6 mm despite low flow velocity and volume    ASSESSMENT/PLAN:: 73 y.o. female status post right arm first stage basilic vein fistula creation  Patent right arm basilic vein fistula without signs or symptoms of steal syndrome.  Over the past month, the fistula has continued to mature and is now greater than 6 mm in diameter throughout the upper arm despite low flow volume and velocity.  On exam she has  a palpable thrill throughout the upper arm.  Plan will be right arm second stage basilic vein transposition with Dr. Myra Gianotti on the next available Wednesday.  Patient will need to hold her Eliquis for 48 hours prior to surgery.  Case was discussed in detail with the patient and her husband and they are agreeable to proceed.  She will continue HD via [facility] for now.   Emilie Rutter, PA-C [facility] [phone]  Clinic MD:   Hetty Blend on call

## 2023-07-23 DIAGNOSIS — N186 End stage renal disease: Secondary | ICD-10-CM | POA: Diagnosis not present

## 2023-07-23 DIAGNOSIS — N2581 Secondary hyperparathyroidism of renal origin: Secondary | ICD-10-CM | POA: Diagnosis not present

## 2023-07-23 DIAGNOSIS — I509 Heart failure, unspecified: Secondary | ICD-10-CM | POA: Diagnosis not present

## 2023-07-23 DIAGNOSIS — Z992 Dependence on renal dialysis: Secondary | ICD-10-CM | POA: Diagnosis not present

## 2023-07-24 ENCOUNTER — Emergency Department (HOSPITAL_COMMUNITY): Payer: Medicare PPO

## 2023-07-24 ENCOUNTER — Inpatient Hospital Stay (HOSPITAL_COMMUNITY)
Admission: EM | Admit: 2023-07-24 | Discharge: 2023-08-01 | DRG: 871 | Disposition: A | Discharge status: Home Health | Payer: Medicare PPO | Attending: Internal Medicine | Admitting: Internal Medicine

## 2023-07-24 ENCOUNTER — Encounter (HOSPITAL_COMMUNITY): Payer: Self-pay | Admitting: Emergency Medicine

## 2023-07-24 ENCOUNTER — Other Ambulatory Visit: Payer: Self-pay

## 2023-07-24 DIAGNOSIS — J9601 Acute respiratory failure with hypoxia: Secondary | ICD-10-CM | POA: Diagnosis present

## 2023-07-24 DIAGNOSIS — G9341 Metabolic encephalopathy: Secondary | ICD-10-CM | POA: Diagnosis present

## 2023-07-24 DIAGNOSIS — R636 Underweight: Secondary | ICD-10-CM | POA: Diagnosis present

## 2023-07-24 DIAGNOSIS — N2581 Secondary hyperparathyroidism of renal origin: Secondary | ICD-10-CM | POA: Diagnosis present

## 2023-07-24 DIAGNOSIS — F05 Delirium due to known physiological condition: Secondary | ICD-10-CM | POA: Diagnosis not present

## 2023-07-24 DIAGNOSIS — E1042 Type 1 diabetes mellitus with diabetic polyneuropathy: Secondary | ICD-10-CM | POA: Diagnosis present

## 2023-07-24 DIAGNOSIS — A4189 Other specified sepsis: Principal | ICD-10-CM | POA: Diagnosis present

## 2023-07-24 DIAGNOSIS — Z79899 Other long term (current) drug therapy: Secondary | ICD-10-CM

## 2023-07-24 DIAGNOSIS — R040 Epistaxis: Secondary | ICD-10-CM | POA: Diagnosis not present

## 2023-07-24 DIAGNOSIS — I959 Hypotension, unspecified: Secondary | ICD-10-CM | POA: Diagnosis present

## 2023-07-24 DIAGNOSIS — R9431 Abnormal electrocardiogram [ECG] [EKG]: Secondary | ICD-10-CM | POA: Diagnosis not present

## 2023-07-24 DIAGNOSIS — Z7982 Long term (current) use of aspirin: Secondary | ICD-10-CM

## 2023-07-24 DIAGNOSIS — Z90722 Acquired absence of ovaries, bilateral: Secondary | ICD-10-CM

## 2023-07-24 DIAGNOSIS — E785 Hyperlipidemia, unspecified: Secondary | ICD-10-CM | POA: Diagnosis present

## 2023-07-24 DIAGNOSIS — Z9641 Presence of insulin pump (external) (internal): Secondary | ICD-10-CM | POA: Diagnosis present

## 2023-07-24 DIAGNOSIS — R197 Diarrhea, unspecified: Secondary | ICD-10-CM | POA: Diagnosis present

## 2023-07-24 DIAGNOSIS — E1043 Type 1 diabetes mellitus with diabetic autonomic (poly)neuropathy: Secondary | ICD-10-CM | POA: Diagnosis present

## 2023-07-24 DIAGNOSIS — K449 Diaphragmatic hernia without obstruction or gangrene: Secondary | ICD-10-CM | POA: Diagnosis present

## 2023-07-24 DIAGNOSIS — J101 Influenza due to other identified influenza virus with other respiratory manifestations: Principal | ICD-10-CM | POA: Diagnosis present

## 2023-07-24 DIAGNOSIS — R911 Solitary pulmonary nodule: Secondary | ICD-10-CM | POA: Diagnosis present

## 2023-07-24 DIAGNOSIS — Z953 Presence of xenogenic heart valve: Secondary | ICD-10-CM

## 2023-07-24 DIAGNOSIS — Z803 Family history of malignant neoplasm of breast: Secondary | ICD-10-CM

## 2023-07-24 DIAGNOSIS — G4733 Obstructive sleep apnea (adult) (pediatric): Secondary | ICD-10-CM | POA: Diagnosis present

## 2023-07-24 DIAGNOSIS — Z923 Personal history of irradiation: Secondary | ICD-10-CM

## 2023-07-24 DIAGNOSIS — H409 Unspecified glaucoma: Secondary | ICD-10-CM | POA: Diagnosis present

## 2023-07-24 DIAGNOSIS — Z955 Presence of coronary angioplasty implant and graft: Secondary | ICD-10-CM | POA: Diagnosis not present

## 2023-07-24 DIAGNOSIS — K7460 Unspecified cirrhosis of liver: Secondary | ICD-10-CM | POA: Diagnosis present

## 2023-07-24 DIAGNOSIS — J1001 Influenza due to other identified influenza virus with the same other identified influenza virus pneumonia: Secondary | ICD-10-CM | POA: Diagnosis present

## 2023-07-24 DIAGNOSIS — Z853 Personal history of malignant neoplasm of breast: Secondary | ICD-10-CM

## 2023-07-24 DIAGNOSIS — Z950 Presence of cardiac pacemaker: Secondary | ICD-10-CM

## 2023-07-24 DIAGNOSIS — K317 Polyp of stomach and duodenum: Secondary | ICD-10-CM | POA: Diagnosis present

## 2023-07-24 DIAGNOSIS — F39 Unspecified mood [affective] disorder: Secondary | ICD-10-CM | POA: Diagnosis present

## 2023-07-24 DIAGNOSIS — R0902 Hypoxemia: Secondary | ICD-10-CM | POA: Diagnosis not present

## 2023-07-24 DIAGNOSIS — R059 Cough, unspecified: Secondary | ICD-10-CM | POA: Diagnosis not present

## 2023-07-24 DIAGNOSIS — Z888 Allergy status to other drugs, medicaments and biological substances status: Secondary | ICD-10-CM

## 2023-07-24 DIAGNOSIS — I5032 Chronic diastolic (congestive) heart failure: Secondary | ICD-10-CM | POA: Diagnosis present

## 2023-07-24 DIAGNOSIS — I480 Paroxysmal atrial fibrillation: Secondary | ICD-10-CM | POA: Diagnosis present

## 2023-07-24 DIAGNOSIS — Z881 Allergy status to other antibiotic agents status: Secondary | ICD-10-CM

## 2023-07-24 DIAGNOSIS — K921 Melena: Secondary | ICD-10-CM | POA: Diagnosis not present

## 2023-07-24 DIAGNOSIS — K7581 Nonalcoholic steatohepatitis (NASH): Secondary | ICD-10-CM | POA: Diagnosis present

## 2023-07-24 DIAGNOSIS — D631 Anemia in chronic kidney disease: Secondary | ICD-10-CM | POA: Diagnosis present

## 2023-07-24 DIAGNOSIS — Z880 Allergy status to penicillin: Secondary | ICD-10-CM

## 2023-07-24 DIAGNOSIS — Z808 Family history of malignant neoplasm of other organs or systems: Secondary | ICD-10-CM

## 2023-07-24 DIAGNOSIS — Z951 Presence of aortocoronary bypass graft: Secondary | ICD-10-CM

## 2023-07-24 DIAGNOSIS — Z801 Family history of malignant neoplasm of trachea, bronchus and lung: Secondary | ICD-10-CM

## 2023-07-24 DIAGNOSIS — T17918A Gastric contents in respiratory tract, part unspecified causing other injury, initial encounter: Secondary | ICD-10-CM | POA: Diagnosis not present

## 2023-07-24 DIAGNOSIS — K3184 Gastroparesis: Secondary | ICD-10-CM | POA: Diagnosis present

## 2023-07-24 DIAGNOSIS — K219 Gastro-esophageal reflux disease without esophagitis: Secondary | ICD-10-CM | POA: Diagnosis present

## 2023-07-24 DIAGNOSIS — J690 Pneumonitis due to inhalation of food and vomit: Secondary | ICD-10-CM | POA: Diagnosis not present

## 2023-07-24 DIAGNOSIS — I132 Hypertensive heart and chronic kidney disease with heart failure and with stage 5 chronic kidney disease, or end stage renal disease: Secondary | ICD-10-CM | POA: Diagnosis present

## 2023-07-24 DIAGNOSIS — Z681 Body mass index (BMI) 19 or less, adult: Secondary | ICD-10-CM

## 2023-07-24 DIAGNOSIS — Z91048 Other nonmedicinal substance allergy status: Secondary | ICD-10-CM

## 2023-07-24 DIAGNOSIS — Z820 Family history of epilepsy and other diseases of the nervous system: Secondary | ICD-10-CM

## 2023-07-24 DIAGNOSIS — Z85528 Personal history of other malignant neoplasm of kidney: Secondary | ICD-10-CM

## 2023-07-24 DIAGNOSIS — E10649 Type 1 diabetes mellitus with hypoglycemia without coma: Secondary | ICD-10-CM | POA: Diagnosis not present

## 2023-07-24 DIAGNOSIS — R531 Weakness: Secondary | ICD-10-CM

## 2023-07-24 DIAGNOSIS — Z7901 Long term (current) use of anticoagulants: Secondary | ICD-10-CM

## 2023-07-24 DIAGNOSIS — Z9049 Acquired absence of other specified parts of digestive tract: Secondary | ICD-10-CM

## 2023-07-24 DIAGNOSIS — R410 Disorientation, unspecified: Secondary | ICD-10-CM

## 2023-07-24 DIAGNOSIS — Z9012 Acquired absence of left breast and nipple: Secondary | ICD-10-CM

## 2023-07-24 DIAGNOSIS — I495 Sick sinus syndrome: Secondary | ICD-10-CM | POA: Diagnosis present

## 2023-07-24 DIAGNOSIS — R1310 Dysphagia, unspecified: Secondary | ICD-10-CM | POA: Diagnosis present

## 2023-07-24 DIAGNOSIS — R278 Other lack of coordination: Secondary | ICD-10-CM | POA: Diagnosis present

## 2023-07-24 DIAGNOSIS — Z794 Long term (current) use of insulin: Secondary | ICD-10-CM

## 2023-07-24 DIAGNOSIS — Z992 Dependence on renal dialysis: Secondary | ICD-10-CM

## 2023-07-24 DIAGNOSIS — Z83511 Family history of glaucoma: Secondary | ICD-10-CM

## 2023-07-24 DIAGNOSIS — Z90710 Acquired absence of both cervix and uterus: Secondary | ICD-10-CM

## 2023-07-24 DIAGNOSIS — E871 Hypo-osmolality and hyponatremia: Secondary | ICD-10-CM | POA: Diagnosis present

## 2023-07-24 DIAGNOSIS — N186 End stage renal disease: Secondary | ICD-10-CM | POA: Diagnosis present

## 2023-07-24 DIAGNOSIS — I700 Atherosclerosis of aorta: Secondary | ICD-10-CM | POA: Diagnosis not present

## 2023-07-24 DIAGNOSIS — Z8249 Family history of ischemic heart disease and other diseases of the circulatory system: Secondary | ICD-10-CM

## 2023-07-24 DIAGNOSIS — Z8571 Personal history of Hodgkin lymphoma: Secondary | ICD-10-CM

## 2023-07-24 DIAGNOSIS — J90 Pleural effusion, not elsewhere classified: Secondary | ICD-10-CM

## 2023-07-24 DIAGNOSIS — R918 Other nonspecific abnormal finding of lung field: Secondary | ICD-10-CM | POA: Diagnosis not present

## 2023-07-24 DIAGNOSIS — Z91040 Latex allergy status: Secondary | ICD-10-CM

## 2023-07-24 DIAGNOSIS — E1022 Type 1 diabetes mellitus with diabetic chronic kidney disease: Secondary | ICD-10-CM | POA: Diagnosis present

## 2023-07-24 DIAGNOSIS — M810 Age-related osteoporosis without current pathological fracture: Secondary | ICD-10-CM | POA: Diagnosis present

## 2023-07-24 DIAGNOSIS — Z833 Family history of diabetes mellitus: Secondary | ICD-10-CM

## 2023-07-24 DIAGNOSIS — Z8711 Personal history of peptic ulcer disease: Secondary | ICD-10-CM

## 2023-07-24 DIAGNOSIS — G2581 Restless legs syndrome: Secondary | ICD-10-CM | POA: Diagnosis present

## 2023-07-24 DIAGNOSIS — Z885 Allergy status to narcotic agent status: Secondary | ICD-10-CM

## 2023-07-24 DIAGNOSIS — R54 Age-related physical debility: Secondary | ICD-10-CM | POA: Diagnosis present

## 2023-07-24 DIAGNOSIS — J918 Pleural effusion in other conditions classified elsewhere: Secondary | ICD-10-CM | POA: Diagnosis present

## 2023-07-24 DIAGNOSIS — Z905 Acquired absence of kidney: Secondary | ICD-10-CM

## 2023-07-24 DIAGNOSIS — I2510 Atherosclerotic heart disease of native coronary artery without angina pectoris: Secondary | ICD-10-CM | POA: Diagnosis present

## 2023-07-24 DIAGNOSIS — R471 Dysarthria and anarthria: Secondary | ICD-10-CM | POA: Diagnosis present

## 2023-07-24 DIAGNOSIS — Z1152 Encounter for screening for COVID-19: Secondary | ICD-10-CM

## 2023-07-24 LAB — RESP PANEL BY RT-PCR (RSV, FLU A&B, COVID)  RVPGX2
Influenza A by PCR: POSITIVE — AB
Influenza B by PCR: NEGATIVE
Resp Syncytial Virus by PCR: NEGATIVE
SARS Coronavirus 2 by RT PCR: NEGATIVE

## 2023-07-24 LAB — CBC
HCT: 36.2 % (ref 36.0–46.0)
Hemoglobin: 11.5 g/dL — ABNORMAL LOW (ref 12.0–15.0)
MCH: 31.2 pg (ref 26.0–34.0)
MCHC: 31.8 g/dL (ref 30.0–36.0)
MCV: 98.1 fL (ref 80.0–100.0)
Platelets: 90 10*3/uL — ABNORMAL LOW (ref 150–400)
RBC: 3.69 MIL/uL — ABNORMAL LOW (ref 3.87–5.11)
RDW: 15.6 % — ABNORMAL HIGH (ref 11.5–15.5)
WBC: 8.5 10*3/uL (ref 4.0–10.5)
nRBC: 0 % (ref 0.0–0.2)

## 2023-07-24 LAB — LACTIC ACID, PLASMA: Lactic Acid, Venous: 1.7 mmol/L (ref 0.5–1.9)

## 2023-07-24 LAB — BASIC METABOLIC PANEL
Anion gap: 11 (ref 5–15)
BUN: 35 mg/dL — ABNORMAL HIGH (ref 8–23)
CO2: 26 mmol/L (ref 22–32)
Calcium: 10 mg/dL (ref 8.9–10.3)
Chloride: 94 mmol/L — ABNORMAL LOW (ref 98–111)
Creatinine, Ser: 3.58 mg/dL — ABNORMAL HIGH (ref 0.44–1.00)
GFR, Estimated: 13 mL/min — ABNORMAL LOW (ref >60)
Glucose, Bld: 133 mg/dL — ABNORMAL HIGH (ref 70–99)
Potassium: 4 mmol/L (ref 3.5–5.1)
Sodium: 131 mmol/L — ABNORMAL LOW (ref 135–145)

## 2023-07-24 LAB — PROTIME-INR
INR: 2.3 — ABNORMAL HIGH (ref 0.8–1.2)
Prothrombin Time: 25.9 s — ABNORMAL HIGH (ref 11.4–15.2)

## 2023-07-24 LAB — CBG MONITORING, ED: Glucose-Capillary: 118 mg/dL — ABNORMAL HIGH (ref 70–99)

## 2023-07-24 MED ORDER — ACETAMINOPHEN 650 MG RE SUPP
650.0000 mg | Freq: Once | RECTAL | Status: AC
  Administered 2023-07-25: 650 mg via RECTAL
  Filled 2023-07-24: qty 1

## 2023-07-24 NOTE — ED Triage Notes (Signed)
 Pt bib EMS from home with c/o weakness and decreased appetite for a couple of days. EMS reports O2 sats of 88% on R/A at home. EMS placed pt on O2 at 2L and sats increased to 96%. Pt also reports new cough.CBG 161 per EMS. Pt with insulin pump.

## 2023-07-24 NOTE — ED Provider Notes (Signed)
 [place] EMERGENCY DEPARTMENT AT [facility] Provider Note   CSN: [id number] Arrival date & time: 07/24/23  2138     History  Chief Complaint  Patient presents with   Weakness   Level 5 caveat due to altered mental status [name] [name] is a 73 y.o. female.  The history is provided by the patient, a relative and the spouse. The history is limited by the condition of the patient.  Weakness  Patient with extensive history including ESRD, CAD, aortic stenosis presents with generalized weakness.  Patient underwent dialysis on Monday, December 30 and did well afterwards.  However over the past 24 hours she has had increasing generalized weakness and confusion.  No fevers.    She has had increasing cough No recent falls or head injuries. She typically can ambulate on her own but has had difficulty moving or walking over the past 24 hours   Past Medical History:  Diagnosis Date   Anxiety    Aortic stenosis    a. bicuspid aortic valve; mean gradient of 20 mmHg in 2/10; b. s/p bioprosth AVR (19-mm Edwards pericardial valve) with a redo coronary artery bypass graft procedure in 07/2009; postoperative Dressler's syndrome     Arthritis    Breast cancer (HCC)    Carcinoma, renal cell (HCC) 05/2007   Laparoscopic right nephrectomy   Chronic diastolic CHF (congestive heart failure) (HCC)    a. 9.2014 EchoP EF 60-65%, no rwma, bioprosth AVR, mean gradient of , mild to mod MR, PASP .   CKD (chronic kidney disease), stage IV (HCC)    Colitis, ischemic (HCC) 2008   Complication of anesthesia    Coronary artery disease    a. initial CABG with SVG-RCA in 12/05. b. redo SVG-RCA and bioprosthetic AVR in 1/11. d. 9/14 - PCI with DES to distal LM/proximal LAD was done rather than bypass as she would have been a poor candidate for redo sternotomy. d. S/p DES to prox LM 05/2014.   Diabetes mellitus 03/2010    TYPE II: Hemoglobin A1c of 7.4 in  03/2010; 8.4 in 06/2010; treated  with insulin  pump   Duodenal ulcer    Remote; H. pylori positive   Dyspnea    Gastroparesis due to DM (HCC) 05/01/2012   Glaucoma 1998   Hodgkin's disease (HCC) 1991   Mantle radiation therapy   Hyperlipidemia    Hyperplastic gastric polyp 06/23/2012   Hypertension    Iron  deficiency anemia    a. 03/2013 EGD: essentially normal. Possible slow GIB.   Migraines    NASH (nonalcoholic steatohepatitis) 1999   -biopsy in 1999   OSA treated with BiPAP    Osteoporosis    Pacemaker    six sinus syndrome   PAF (paroxysmal atrial fibrillation) (HCC)    a.  h/o brief episodes noted on ppm interrogation. b. given GI bleeding and need to be on both ASA 81 and Brilinta , risks likely outweigh benefits of anticoagulation.    PONV (postoperative nausea and vomiting)    Small bowel obstruction (HCC)    Recurrent; resolved after resection of a lipoma   Syncope    a. H/o recurrent syncope with pauses on loop recorder s/p Medtronic pacemaker 07/2012.    Home Medications Prior to Admission medications   Medication Sig Start Date End Date Taking? Authorizing Provider  acyclovir  ointment (ZOVIRAX ) 5 % Apply 1 application topically every 3 (three) hours as needed (fever blister/cold sores).    [provider]  ALPRAZolam  (XANAX ) 0.5  MG tablet Take 1 tablet (0.5 mg total) by mouth 2 (two) times daily as needed for anxiety. 01/15/18   Rolinda Millman, MD  APIDRA  100 UNIT/ML injection Inject 40 Units into the skin as directed. INFUSE THROUGH INSULIN  PUMP UTD, Bolus depends on carb intake 02/04/15   [provider]  apixaban  (ELIQUIS ) 5 MG TABS tablet Take 1 tablet (5 mg total) by mouth 2 (two) times daily. 07/12/23   Rolan Ezra RAMAN, MD  aspirin  EC 81 MG tablet Take 81 mg by mouth at bedtime.    [provider]  cetirizine (ZYRTEC) 10 MG tablet Take 10 mg by mouth in the morning.    [provider]  clobetasol  (OLUX ) 0.05 % topical foam Apply 1 application  topically 2 (two)  times daily as needed (skin flaking (legs)).    [provider]  Coenzyme Q10 (CO Q 10) 100 MG CAPS Take 100 mg by mouth at bedtime.     [provider]  colchicine  0.6 MG tablet Take 0.6 mg by mouth daily as needed (gout flare).    [provider]  denosumab  (PROLIA ) 60 MG/ML SOSY injection Inject 60 mg into the skin every 6 (six) months.    [provider]  diclofenac  Sodium (VOLTAREN ) 1 % GEL Apply 2 g topically 4 (four) times daily as needed (pain.).    [provider]  escitalopram  (LEXAPRO ) 5 MG tablet Take 5 mg by mouth at bedtime.    [provider]  famotidine  (PEPCID ) 40 MG tablet Take 40 mg by mouth at bedtime.    [provider]  gabapentin  (NEURONTIN ) 100 MG capsule Take 100 mg by mouth daily.    [provider]  glucagon  (GLUCAGON  EMERGENCY) 1 MG injection Inject 1 mg into the vein once as needed (low blood sugar).    [provider]  isosorbide  mononitrate (IMDUR ) 120 MG 24 hr tablet Take 1 tablet (120 mg total) by mouth every morning. 05/08/23   Rolan Ezra RAMAN, MD  latanoprost  (XALATAN ) 0.005 % ophthalmic solution Place 1 drop into both eyes at bedtime.    [provider]  meclizine  (ANTIVERT ) 25 MG tablet Take 1 tablet (25 mg total) by mouth 2 (two) times daily as needed for dizziness. 07/18/23   Raford Lenis, MD  metolazone  (ZAROXOLYN ) 2.5 MG tablet Take 2.5 mg (1 tab) once as directed by CHF clinic. Call before taking [phone] 05/31/21   Rolan Ezra RAMAN, MD  metoprolol  succinate (TOPROL  XL) 25 MG 24 hr tablet Take 0.5 tablets (12.5 mg total) by mouth daily. 05/22/23   Rolan Ezra RAMAN, MD  midodrine  (PROAMATINE ) 5 MG tablet Take 1 tablet (5 mg total) by mouth daily as needed (Prior to HD). 05/08/23   Rolan Ezra RAMAN, MD  mometasone  (NASONEX ) 50 MCG/ACT nasal spray Place 1 spray into the nose daily as needed (allergies). 03/10/20   [provider]  mupirocin  cream (BACTROBAN ) 2  % Apply 1 application topically 2 (two) times daily as needed. 02/11/18   Rolinda Millman, MD  nitroGLYCERIN  (NITROLINGUAL ) 0.4 MG/SPRAY spray PLACE 1 SPRAY UNDER TH TONGUE EVERY 5 MINUTES FOR 3 DOSES AS NEEDED FOR CHEST PAIN 12/25/22   Rolan Ezra RAMAN, MD  polyethylene glycol (MIRALAX  / GLYCOLAX ) 17 g packet Take 17 g by mouth in the morning. Hold for loose stools    [provider]  potassium chloride  SA (KLOR-CON  M) 20 MEQ tablet Take 1 tablet (20 mEq total) by mouth 2 (two) times daily. 07/18/23  Raford Lenis, MD  pravastatin  (PRAVACHOL ) 10 MG tablet TAKE ONE TABLET (10 MG TOTAL) BY MOUTH DAILY. 04/23/23   Rolan Ezra RAMAN, MD  Probiotic Product (ALIGN PO) Take 1 tablet by mouth daily in the afternoon.    [provider]  REPATHA  SURECLICK 140 MG/ML SOAJ INJECT 140MG  INTO THE SKIN EVERY 14 DAYS 03/29/23   Rolan Ezra RAMAN, MD  torsemide  (DEMADEX ) 20 MG tablet Take 40 mg Twice daily on Wed,sat and sun 05/08/23   Rolan Ezra RAMAN, MD  ULORIC  40 MG tablet Take 1 tablet (40 mg total) by mouth at bedtime. 11/29/17   Rolinda Millman, MD  VASCEPA  1 g capsule TAKE TWO CAPSULES (2 GRAMS TOTAL) BY MOUTH TWO TIMES DAILY 02/26/23   Rolan Ezra RAMAN, MD      Allergies    Avandia [rosiglitazone maleate], Cephalexin, Clindamycin, Humalog [insulin  lispro], Lincomycin, Metformin, Novolog  [insulin  aspart (human analog)], Penicillins, Tizanidine, Adhesive [tape], Atorvastatin, Cholestyramine, Gentamycin [gentamicin ], Iodinated contrast media, Limonene, Prednisone , Ranexa  [ranolazine ], Rosuvastatin, Simvastatin , Zinc , Acetaminophen , Azelastine , Clopidogrel , Kerendia  [finerenone ], Lisinopril , Neosporin [bacitracin -polymyxin b ], Codeine, Erythromycin , Hydrocodone -acetaminophen , Latex, Neomycin, Nickel, Ranitidine, Tamiflu [oseltamivir], and Tramadol    Review of Systems   Review of Systems  Unable to perform ROS: Mental status change  Neurological:  Positive for weakness.    Physical Exam Updated Vital  Signs BP (!) 129/55   Pulse (!) 101   Temp (!) 101.3 F (38.5 C) (Rectal)   Resp (!) 25   Ht 1.575 m (5' 2)   SpO2 97%   BMI 20.85 kg/m  Physical Exam CONSTITUTIONAL: Chronically ill-appearing HEAD: Normocephalic/atraumatic EYES: EOMI/PERRL ENMT: Mucous membranes dry NECK: supple no meningeal signs SPINE/BACK: CV: S1/S2 noted murmur noted LUNGS: Tachypnea, decreased breath sounds noted bilaterally ABDOMEN: soft, nontender, diabetic pump in place NEURO: Pt is somnolent but easily arousable, follows commands, no facial droop no arm or leg drift EXTREMITIES: pulses normal/equal, full ROM, no deformities SKIN: warm, catheter noted to right chest  ED Results / Procedures / Treatments   Labs (all labs ordered are listed, but only abnormal results are displayed) Labs Reviewed  RESP PANEL BY RT-PCR (RSV, FLU A&B, COVID)  RVPGX2 - Abnormal; Notable for the following components:      Result Value   Influenza A by PCR POSITIVE (*)    All other components within normal limits  BASIC METABOLIC PANEL - Abnormal; Notable for the following components:   Sodium 131 (*)    Chloride 94 (*)    Glucose, Bld 133 (*)    BUN 35 (*)    Creatinine, Ser 3.58 (*)    GFR, Estimated 13 (*)    All other components within normal limits  CBC - Abnormal; Notable for the following components:   RBC 3.69 (*)    Hemoglobin 11.5 (*)    RDW 15.6 (*)    Platelets 90 (*)    All other components within normal limits  PROTIME-INR - Abnormal; Notable for the following components:   Prothrombin Time 25.9 (*)    INR 2.3 (*)    All other components within normal limits  CBG MONITORING, ED - Abnormal; Notable for the following components:   Glucose-Capillary 118 (*)    All other components within normal limits  CULTURE, BLOOD (ROUTINE X 2)  CULTURE, BLOOD (ROUTINE X 2)  LACTIC ACID, PLASMA  URINALYSIS, ROUTINE W REFLEX MICROSCOPIC    EKG EKG Interpretation Date/Time:  Wednesday July 24 2023 22:02:14  EST Ventricular Rate:  103 PR Interval:  190  QRS Duration:  91 QT Interval:  357 QTC Calculation: 468 R Axis:   -55  Text Interpretation: Sinus tachycardia Probable left atrial enlargement Left anterior fascicular block Abnormal R-wave progression, late transition LVH with secondary repolarization abnormality Confirmed by Midge Golas ([id number]) on 07/24/2023 11:07:03 PM  Radiology DG Chest Portable 1 View Result Date: 07/24/2023 CLINICAL DATA:  new cough EXAM: PORTABLE CHEST 1 VIEW COMPARISON:  Chest x-ray 05/01/2023 FINDINGS: Left chest wall dual lead pacemaker. Right chest wall dialysis catheter with tip overlying the right atrium. The heart and mediastinal contours are unchanged. Atherosclerotic plaque. Coronary artery stents. Interval development of likely loculated moderate volume right pleural effusion. Diffuse hazy airspace opacity of the right lung. No pulmonary edema. No left pleural effusion. Left lung grossly unremarkable. No pneumothorax. No acute osseous abnormality. IMPRESSION: 1. Interval development of likely loculated moderate volume right pleural effusion. Diffuse hazy airspace opacity of the right lung may represent infection/inflammation versus atelectasis. 2.  Aortic Atherosclerosis (ICD10-I70.0). Electronically Signed   By: Morgane  Naveau M.D.   On: 07/24/2023 22:30    Procedures .Critical Care  Performed by: Midge Golas, MD Authorized by: Midge Golas, MD   Critical care provider statement:    Critical care time (minutes):  43   Critical care start time:  07/24/2023 11:47 PM   Critical care end time:  07/25/2023 12:30 AM   Critical care time was exclusive of:  Separately billable procedures and treating other patients   Critical care was necessary to treat or prevent imminent or life-threatening deterioration of the following conditions:  Respiratory failure, sepsis and shock   Critical care was time spent personally by me on the following activities:   Examination of patient, development of treatment plan with patient or surrogate, re-evaluation of patient's condition, ordering and review of laboratory studies, ordering and review of radiographic studies, pulse oximetry, obtaining history from patient or surrogate, evaluation of patient's response to treatment, review of old charts and ordering and performing treatments and interventions   I assumed direction of critical care for this patient from another provider in my specialty: no     Care discussed with: admitting provider       Medications Ordered in ED Medications  acetaminophen  (TYLENOL ) suppository 650 mg (650 mg Rectal Given 07/25/23 0008)    ED Course/ Medical Decision Making/ A&P Clinical Course as of 07/25/23 0052  Thu Jul 25, 2023  0000 Creatinine(!): 3.58 Chronic renal failure [DW]  0000 Influenza A By PCR(!): POSITIVE Positive for influenza [DW]  0005 Patient with extensive medical history presents with confusion and weakness over the past 24 hours.  Patient now found to be febrile and positive for influenza.  His likely acute delirium caused by influenza virus.  Patient also noted to have increased oxygen requirement and does have worsening pleural effusion, she would need to be admitted [DW]  0052 Patient is more alert, but was found to be febrile.  Once her mental status improves, she will need to be given Tamiflu.  Patient currently protecting her airway in no acute distress  Discussed with Dr. Sedonia for admission [DW]    Clinical Course User Index [DW] Midge Golas, MD                                 Medical Decision Making Amount and/or Complexity of Data Reviewed Labs: ordered. Decision-making details documented in ED Course. Radiology: ordered.  Risk OTC drugs. Decision  regarding hospitalization.   This patient presents to the ED for concern of weakness, this involves an extensive number of treatment options, and is a complaint that carries with it a  high risk of complications and morbidity.  The differential diagnosis includes but is not limited to CVA, intracranial hemorrhage, acute coronary syndrome, renal failure, urinary tract infection, electrolyte disturbance, pneumonia, sepsis    Comorbidities that complicate the patient evaluation: Patient's presentation is complicated by their history of ESRD  Social Determinants of Health: Patient's  poor mobility   increases the complexity of managing their presentation  Additional history obtained: Additional history obtained from family Records reviewed  outpatient records reviewed  Lab Tests: I Ordered, and personally interpreted labs.  The pertinent results include: Chronic renal failure, positive for influenza  Imaging Studies ordered: I ordered imaging studies including X-ray chest   I independently visualized and interpreted imaging which showed right pleural effusion I agree with the radiologist interpretation  Cardiac Monitoring: The patient was maintained on a cardiac monitor.  I personally viewed and interpreted the cardiac monitor which showed an underlying rhythm of:  sinus rhythm  Medicines ordered and prescription drug management: I ordered medication including Tylenol  for fever Reevaluation of the patient after these medicines showed that the patient    improved   Critical Interventions:   admission for delirium due to influenza  Consultations Obtained: I requested consultation with the admitting physician [facility] , and discussed  findings as well as pertinent plan - they recommend: Admit  Reevaluation: After the interventions noted above, I reevaluated the patient and found that they have :improved  Complexity of problems addressed: Patient's presentation is most consistent with  acute presentation with potential threat to life or bodily function  Disposition: After consideration of the diagnostic results and the patient's response to treatment,  I feel that  the patent would benefit from admission   .           Final Clinical Impression(s) / ED Diagnoses Final diagnoses:  Influenza A  Pleural effusion  Weakness  Delirious    Rx / DC Orders ED Discharge Orders     None         Midge Golas, MD 07/25/23 [phone]

## 2023-07-25 ENCOUNTER — Emergency Department (HOSPITAL_COMMUNITY): Payer: Medicare PPO

## 2023-07-25 DIAGNOSIS — Z992 Dependence on renal dialysis: Secondary | ICD-10-CM | POA: Diagnosis not present

## 2023-07-25 DIAGNOSIS — J984 Other disorders of lung: Secondary | ICD-10-CM | POA: Diagnosis not present

## 2023-07-25 DIAGNOSIS — R410 Disorientation, unspecified: Secondary | ICD-10-CM | POA: Diagnosis not present

## 2023-07-25 DIAGNOSIS — R531 Weakness: Secondary | ICD-10-CM | POA: Diagnosis present

## 2023-07-25 DIAGNOSIS — D631 Anemia in chronic kidney disease: Secondary | ICD-10-CM | POA: Diagnosis present

## 2023-07-25 DIAGNOSIS — A4189 Other specified sepsis: Secondary | ICD-10-CM | POA: Diagnosis present

## 2023-07-25 DIAGNOSIS — E8779 Other fluid overload: Secondary | ICD-10-CM | POA: Diagnosis not present

## 2023-07-25 DIAGNOSIS — R4182 Altered mental status, unspecified: Secondary | ICD-10-CM | POA: Diagnosis not present

## 2023-07-25 DIAGNOSIS — J9811 Atelectasis: Secondary | ICD-10-CM | POA: Diagnosis not present

## 2023-07-25 DIAGNOSIS — F39 Unspecified mood [affective] disorder: Secondary | ICD-10-CM | POA: Diagnosis present

## 2023-07-25 DIAGNOSIS — G2581 Restless legs syndrome: Secondary | ICD-10-CM | POA: Diagnosis present

## 2023-07-25 DIAGNOSIS — I120 Hypertensive chronic kidney disease with stage 5 chronic kidney disease or end stage renal disease: Secondary | ICD-10-CM | POA: Diagnosis not present

## 2023-07-25 DIAGNOSIS — R918 Other nonspecific abnormal finding of lung field: Secondary | ICD-10-CM | POA: Diagnosis not present

## 2023-07-25 DIAGNOSIS — J189 Pneumonia, unspecified organism: Secondary | ICD-10-CM | POA: Diagnosis not present

## 2023-07-25 DIAGNOSIS — N186 End stage renal disease: Secondary | ICD-10-CM | POA: Diagnosis present

## 2023-07-25 DIAGNOSIS — R1310 Dysphagia, unspecified: Secondary | ICD-10-CM | POA: Diagnosis present

## 2023-07-25 DIAGNOSIS — J918 Pleural effusion in other conditions classified elsewhere: Secondary | ICD-10-CM | POA: Diagnosis present

## 2023-07-25 DIAGNOSIS — K921 Melena: Secondary | ICD-10-CM | POA: Diagnosis not present

## 2023-07-25 DIAGNOSIS — I495 Sick sinus syndrome: Secondary | ICD-10-CM | POA: Diagnosis present

## 2023-07-25 DIAGNOSIS — K625 Hemorrhage of anus and rectum: Secondary | ICD-10-CM | POA: Diagnosis not present

## 2023-07-25 DIAGNOSIS — Z953 Presence of xenogenic heart valve: Secondary | ICD-10-CM | POA: Diagnosis not present

## 2023-07-25 DIAGNOSIS — J9601 Acute respiratory failure with hypoxia: Secondary | ICD-10-CM | POA: Diagnosis present

## 2023-07-25 DIAGNOSIS — R0989 Other specified symptoms and signs involving the circulatory and respiratory systems: Secondary | ICD-10-CM | POA: Diagnosis not present

## 2023-07-25 DIAGNOSIS — R471 Dysarthria and anarthria: Secondary | ICD-10-CM | POA: Diagnosis not present

## 2023-07-25 DIAGNOSIS — I132 Hypertensive heart and chronic kidney disease with heart failure and with stage 5 chronic kidney disease, or end stage renal disease: Secondary | ICD-10-CM | POA: Diagnosis present

## 2023-07-25 DIAGNOSIS — G9341 Metabolic encephalopathy: Secondary | ICD-10-CM | POA: Diagnosis present

## 2023-07-25 DIAGNOSIS — E1022 Type 1 diabetes mellitus with diabetic chronic kidney disease: Secondary | ICD-10-CM | POA: Diagnosis present

## 2023-07-25 DIAGNOSIS — R042 Hemoptysis: Secondary | ICD-10-CM | POA: Diagnosis not present

## 2023-07-25 DIAGNOSIS — K7460 Unspecified cirrhosis of liver: Secondary | ICD-10-CM | POA: Diagnosis present

## 2023-07-25 DIAGNOSIS — K7581 Nonalcoholic steatohepatitis (NASH): Secondary | ICD-10-CM | POA: Diagnosis not present

## 2023-07-25 DIAGNOSIS — J90 Pleural effusion, not elsewhere classified: Secondary | ICD-10-CM | POA: Diagnosis not present

## 2023-07-25 DIAGNOSIS — J690 Pneumonitis due to inhalation of food and vomit: Secondary | ICD-10-CM | POA: Diagnosis not present

## 2023-07-25 DIAGNOSIS — J101 Influenza due to other identified influenza virus with other respiratory manifestations: Secondary | ICD-10-CM | POA: Diagnosis not present

## 2023-07-25 DIAGNOSIS — Z1152 Encounter for screening for COVID-19: Secondary | ICD-10-CM | POA: Diagnosis not present

## 2023-07-25 DIAGNOSIS — I480 Paroxysmal atrial fibrillation: Secondary | ICD-10-CM | POA: Diagnosis present

## 2023-07-25 DIAGNOSIS — Z4682 Encounter for fitting and adjustment of non-vascular catheter: Secondary | ICD-10-CM | POA: Diagnosis not present

## 2023-07-25 DIAGNOSIS — J1001 Influenza due to other identified influenza virus with the same other identified influenza virus pneumonia: Secondary | ICD-10-CM | POA: Diagnosis present

## 2023-07-25 DIAGNOSIS — E871 Hypo-osmolality and hyponatremia: Secondary | ICD-10-CM | POA: Diagnosis present

## 2023-07-25 DIAGNOSIS — G4731 Primary central sleep apnea: Secondary | ICD-10-CM | POA: Diagnosis not present

## 2023-07-25 DIAGNOSIS — A419 Sepsis, unspecified organism: Secondary | ICD-10-CM | POA: Diagnosis not present

## 2023-07-25 DIAGNOSIS — Z681 Body mass index (BMI) 19 or less, adult: Secondary | ICD-10-CM | POA: Diagnosis not present

## 2023-07-25 DIAGNOSIS — N2581 Secondary hyperparathyroidism of renal origin: Secondary | ICD-10-CM | POA: Diagnosis present

## 2023-07-25 DIAGNOSIS — I5032 Chronic diastolic (congestive) heart failure: Secondary | ICD-10-CM | POA: Diagnosis present

## 2023-07-25 DIAGNOSIS — I38 Endocarditis, valve unspecified: Secondary | ICD-10-CM | POA: Diagnosis not present

## 2023-07-25 DIAGNOSIS — R0902 Hypoxemia: Secondary | ICD-10-CM | POA: Diagnosis not present

## 2023-07-25 LAB — CBG MONITORING, ED
Glucose-Capillary: 90 mg/dL (ref 70–99)
Glucose-Capillary: 102 mg/dL — ABNORMAL HIGH (ref 70–99)
Glucose-Capillary: 125 mg/dL — ABNORMAL HIGH (ref 70–99)
Glucose-Capillary: 110 mg/dL — ABNORMAL HIGH (ref 70–99)
Glucose-Capillary: 111 mg/dL — ABNORMAL HIGH (ref 70–99)
Glucose-Capillary: 168 mg/dL — ABNORMAL HIGH (ref 70–99)

## 2023-07-25 LAB — TSH: TSH: 1.851 u[IU]/mL (ref 0.350–4.500)

## 2023-07-25 LAB — HIV ANTIBODY (ROUTINE TESTING W REFLEX): HIV Screen 4th Generation wRfx: NONREACTIVE

## 2023-07-25 LAB — BLOOD GAS, VENOUS
Acid-Base Excess: 3.9 mmol/L — ABNORMAL HIGH (ref 0.0–2.0)
Bicarbonate: 28.5 mmol/L — ABNORMAL HIGH (ref 20.0–28.0)
Drawn by: 53361
O2 Saturation: 94.3 %
Patient temperature: 37.1
pCO2, Ven: 42 mm[Hg] — ABNORMAL LOW (ref 44–60)
pH, Ven: 7.44 — ABNORMAL HIGH (ref 7.25–7.43)
pO2, Ven: 71 mm[Hg] — ABNORMAL HIGH (ref 32–45)

## 2023-07-25 LAB — VITAMIN B12: Vitamin B-12: 630 pg/mL (ref 180–914)

## 2023-07-25 LAB — AMMONIA: Ammonia: 43 umol/L — ABNORMAL HIGH (ref 9–35)

## 2023-07-25 MED ORDER — ESCITALOPRAM OXALATE 10 MG PO TABS
5.0000 mg | ORAL_TABLET | Freq: Every day | ORAL | Status: DC
  Administered 2023-07-25 – 2023-07-31 (×7): 5 mg via ORAL
  Filled 2023-07-25 (×7): qty 1

## 2023-07-25 MED ORDER — FAMOTIDINE 20 MG PO TABS
40.0000 mg | ORAL_TABLET | Freq: Every day | ORAL | Status: DC
  Administered 2023-07-25 – 2023-07-26 (×2): 40 mg via ORAL
  Filled 2023-07-25 (×2): qty 2

## 2023-07-25 MED ORDER — SODIUM CHLORIDE 0.9% FLUSH
3.0000 mL | Freq: Two times a day (BID) | INTRAVENOUS | Status: DC
  Administered 2023-07-25 – 2023-08-01 (×12): 3 mL via INTRAVENOUS

## 2023-07-25 MED ORDER — MIDODRINE HCL 5 MG PO TABS
5.0000 mg | ORAL_TABLET | ORAL | Status: DC
  Administered 2023-07-25: 5 mg via ORAL
  Filled 2023-07-25: qty 1

## 2023-07-25 MED ORDER — FLUTICASONE PROPIONATE 50 MCG/ACT NA SUSP
2.0000 | Freq: Every day | NASAL | Status: DC
  Filled 2023-07-25: qty 16

## 2023-07-25 MED ORDER — ALPRAZOLAM 0.5 MG PO TABS
0.5000 mg | ORAL_TABLET | Freq: Every evening | ORAL | Status: DC | PRN
  Administered 2023-07-29 – 2023-07-30 (×2): 0.5 mg via ORAL
  Filled 2023-07-25 (×2): qty 1

## 2023-07-25 MED ORDER — LEVOFLOXACIN 500 MG PO TABS
500.0000 mg | ORAL_TABLET | ORAL | Status: AC
  Administered 2023-07-27 – 2023-07-29 (×2): 500 mg via ORAL
  Filled 2023-07-25 (×2): qty 1

## 2023-07-25 MED ORDER — INSULIN GLARGINE-YFGN 100 UNIT/ML ~~LOC~~ SOLN
38.0000 [IU] | Freq: Every day | SUBCUTANEOUS | Status: DC
Start: 2023-07-25 — End: 2023-07-25
  Administered 2023-07-25: 38 [IU] via SUBCUTANEOUS
  Filled 2023-07-25 (×2): qty 0.38

## 2023-07-25 MED ORDER — MIDODRINE HCL 5 MG PO TABS
5.0000 mg | ORAL_TABLET | Freq: Three times a day (TID) | ORAL | Status: DC
  Administered 2023-07-25 (×3): 5 mg via ORAL
  Filled 2023-07-25 (×3): qty 1

## 2023-07-25 MED ORDER — LACTULOSE 10 GM/15ML PO SOLN
20.0000 g | Freq: Two times a day (BID) | ORAL | Status: DC
  Administered 2023-07-25 (×2): 20 g via ORAL
  Filled 2023-07-25 (×2): qty 30

## 2023-07-25 MED ORDER — METOPROLOL SUCCINATE ER 25 MG PO TB24
12.5000 mg | ORAL_TABLET | Freq: Every day | ORAL | Status: DC
  Administered 2023-07-25: 12.5 mg via ORAL
  Filled 2023-07-25: qty 1

## 2023-07-25 MED ORDER — SODIUM CHLORIDE 0.9 % IV BOLUS
500.0000 mL | Freq: Once | INTRAVENOUS | Status: AC
  Administered 2023-07-25: 500 mL via INTRAVENOUS

## 2023-07-25 MED ORDER — LEVALBUTEROL TARTRATE 45 MCG/ACT IN AERO: 2.0000 | INHALATION_SPRAY | Freq: Four times a day (QID) | RESPIRATORY_TRACT | Status: DC | PRN

## 2023-07-25 MED ORDER — APIXABAN 5 MG PO TABS
5.0000 mg | ORAL_TABLET | Freq: Two times a day (BID) | ORAL | Status: DC
  Administered 2023-07-25 (×2): 5 mg via ORAL
  Filled 2023-07-25 (×2): qty 1

## 2023-07-25 MED ORDER — LEVOFLOXACIN 750 MG PO TABS
750.0000 mg | ORAL_TABLET | Freq: Once | ORAL | Status: AC
  Administered 2023-07-25: 750 mg via ORAL
  Filled 2023-07-25: qty 1

## 2023-07-25 MED ORDER — METOPROLOL TARTRATE 5 MG/5ML IV SOLN
2.5000 mg | Freq: Once | INTRAVENOUS | Status: AC
  Administered 2023-07-25: 2.5 mg via INTRAVENOUS
  Filled 2023-07-25: qty 5

## 2023-07-25 MED ORDER — CHLORHEXIDINE GLUCONATE CLOTH 2 % EX PADS
6.0000 | MEDICATED_PAD | Freq: Every day | CUTANEOUS | Status: DC
  Administered 2023-07-27 – 2023-08-01 (×6): 6 via TOPICAL

## 2023-07-25 MED ORDER — ASPIRIN 81 MG PO TBEC
81.0000 mg | DELAYED_RELEASE_TABLET | Freq: Every day | ORAL | Status: DC
  Administered 2023-07-25: 81 mg via ORAL
  Filled 2023-07-25: qty 1

## 2023-07-25 MED ORDER — INSULIN ASPART 100 UNIT/ML IJ SOLN: 0.0000 [IU] | Freq: Three times a day (TID) | INTRAMUSCULAR | Status: DC

## 2023-07-25 MED ORDER — PRAVASTATIN SODIUM 10 MG PO TABS
10.0000 mg | ORAL_TABLET | Freq: Every day | ORAL | Status: DC
  Filled 2023-07-25: qty 1

## 2023-07-25 MED ORDER — ONDANSETRON HCL 4 MG/2ML IJ SOLN
4.0000 mg | Freq: Four times a day (QID) | INTRAMUSCULAR | Status: DC | PRN
  Administered 2023-07-25 – 2023-07-30 (×5): 4 mg via INTRAVENOUS
  Filled 2023-07-25 (×6): qty 2

## 2023-07-25 MED ORDER — ACETAMINOPHEN 500 MG PO TABS
500.0000 mg | ORAL_TABLET | Freq: Four times a day (QID) | ORAL | Status: DC | PRN
  Administered 2023-07-26 – 2023-07-31 (×3): 500 mg via ORAL
  Filled 2023-07-25 (×3): qty 1

## 2023-07-25 MED ORDER — INSULIN ASPART 100 UNIT/ML IJ SOLN
0.0000 [IU] | Freq: Three times a day (TID) | INTRAMUSCULAR | Status: DC
Start: 2023-07-25 — End: 2023-07-25

## 2023-07-25 MED ORDER — INSULIN GLARGINE-YFGN 100 UNIT/ML ~~LOC~~ SOLN
38.0000 [IU] | SUBCUTANEOUS | Status: DC
  Administered 2023-07-26: 38 [IU] via SUBCUTANEOUS
  Filled 2023-07-25 (×2): qty 0.38

## 2023-07-25 NOTE — ED Notes (Signed)
 Pt incontinent of urine, small amount. Cleaned and changed with NT assistance. Rectal temp also obtained at this time as pt felt warm to touch. Temp 102.7, pt states she cannot take Tylenol . Will notify Dr. Segars. Pt also coughing more frequently, some crackles noted - will notify doc.

## 2023-07-25 NOTE — Inpatient Diabetes Management (Addendum)
 Inpatient Diabetes Program Recommendations  AACE/ADA: New Consensus Statement on Inpatient Glycemic Control  Target Ranges:  Prepandial:   less than 140 mg/dL      Peak postprandial:   less than 180 mg/dL (1-2 hours)      Critically ill patients:  140 - 180 mg/dL    Latest Reference Range & Units 07/24/23 21:54 07/25/23 04:32 07/25/23 08:15  Glucose-Capillary 70 - 99 mg/dL 881 (H) 90  Semglee  38 units @4 :49 am 102 (H)   Review of Glycemic Control  Diabetes history: DM1 (does NOT make any insulin ; requires basal, correction, and carb coverage insulin ) Outpatient Diabetes medications: Medtronic insulin  pump 780G with Apidria; Lantus  38 units daily when not using pump Current orders for Inpatient glycemic control: Semglee  38 units QHS  Inpatient Diabetes Program Recommendations:    Insulin : Patient received Semglee  38 units at 4:49 am today. Please consider ordering CBGs AC&HS and Apidra  0-6 units AC&HS. If patient has any issues with hypoglycemia, will need to decrease Semglee  dose.  NOTE: Patient admitted with influenza pneumonia, sepsis, pleural effusion, a-fib with RVR, and encephalopathy. Per H&P, insulin  pump was removed due to encephalopathy and patient only has Semglee  ordered. Per chart, patient has DM1 and uses an insulin  pump for DM control. Per Care Everywhere, patient sees [facility] for DM control and seen M. Rafidi, NP on 06/24/23. Per office note on 06/24/23, patient's pump settings are:   Medtronic 780G Active insulin  Time: 2 hours  Start Time Basal Rate CF CR Target BG  12:00 AM 0.925 15 4.0 130  5:00 AM 2.25 7 AM-5 11 AM-2.8  1:30 PM 4.00 4 PM-2  7 PM 5.00 9 PM-3.5   Off pump plan: 38 units Lantus  plus Apidra  per ICR.   Addendum 07/25/23@11 :55-Spoke with patient and daughter at bedside in ED regarding DM control. Patient has DM1 and uses Medtronic 780G insulin  pump with Guardian4 CGM as an outpatient. Patient states that her insulin  pump was removed around 3am  today and that she still has on her infusion site and her Guardian CGM. Patient notes that her current infusion site has been in for 3 days and her Guardian CGM has expired. Discussed that she was given Semglee  38 units at 4:49 am today and provider has ordered Novolog  correction scale this morning. Patient notes that she has an allergy to Novolog , Humalog, and Regular insulin  so she has to take Apidra  for short acting insulin . Patient states her husband is at home sick with the flu as well so he is not able to bring the Apidra  to the hospital. Informed patient that I would check with pharmacy here and ask if there was anyway they could get Apidra  from a local pharmacy; if not we will need patient to find someone to bring the Apidra  from home to the hospital. Also asked that if she has to get home supply of Apidra  asked that she go ahead and have them bring extra pump supplies and Guardian4 sensors to have in case she is able to resume insulin  pump at some point while inpatient. Patient notes that she has a lot of insulin  resistance.   Reviewed insulin  pump settings which are: Basal 12A     0.925 units/hr 5A       2.25 units/hr 7A       2.8 units/hr 1:30P  4.0 units/hr  7P       5.0 units/hr 9P       3.5 units/hr Total Basal: 70.75 units  Insulin  to  Carb Ratio 12A  1:4 grams 11A   1:2.8 grams 4P     1:2 grams 9P     1:3.5 grams  Insulin  Sensitivity 12A 1:15 mg/dl 7A   1:5 mg/dl Communicated with Elspeth Lied to see if hospital could get Apidria. None of the [facility] inpatient pharmacies have Apidra . Since patient is awaiting transfer to [facility], will go ahead and patient's daughter Judith Shine [phone]) to see if they can have someone bring the Apidra  insulin  as well as extra pump supplies and Guardian sensors. Spoke with Dorthea and asked that she have someone bring the Apidra  to the hospital along with extra pump supplies and Guardian sensors in cause pump is resumed while  inpatient.  Thanks, Earnie Gainer, RN, MSN, CDCES Diabetes Coordinator Inpatient Diabetes Program [phone] (Team Pager from 8am to 5pm)

## 2023-07-25 NOTE — ED Notes (Signed)
 Notified Dr. Laural Benes that patients HR still in the 130s despite medication admin. See MAR.

## 2023-07-25 NOTE — ED Notes (Signed)
 Insulin pump removed

## 2023-07-25 NOTE — Progress Notes (Signed)
 ASSUMPTION OF CARE NOTE   07/25/2023 10:52 AM  [name] [name] was seen and examined.  The H&P by the admitting provider, orders, imaging was reviewed.  Pt and daughter updated at bedside. Currently awaiting bed at [facility]. I am requesting cardio and nephrology to see while she is waiting here for a bed since we don't know when she will be transferred and currently waiting more than 7 hours for a bed.  Pt says she is due to have HD treatment today.    Vitals:   07/25/23 1000 07/25/23 1015  BP: (!) 108/49   Pulse: 85   Resp: 16   Temp:  98.9 F (37.2 C)  SpO2: 97%     Results for orders placed or performed during the hospital encounter of 07/24/23  CBG monitoring, ED   Collection Time: 07/24/23  9:54 PM  Result Value Ref Range   Glucose-Capillary 118 (H) 70 - 99 mg/dL  Resp panel by RT-PCR (RSV, Flu A&B, Covid) Anterior Nasal Swab   Collection Time: 07/24/23  9:55 PM   Specimen: Anterior Nasal Swab  Result Value Ref Range   SARS Coronavirus 2 by RT PCR NEGATIVE NEGATIVE   Influenza A by PCR POSITIVE (A) NEGATIVE   Influenza B by PCR NEGATIVE NEGATIVE   Resp Syncytial Virus by PCR NEGATIVE NEGATIVE  Basic metabolic panel   Collection Time: 07/24/23 10:53 PM  Result Value Ref Range   Sodium 131 (L) 135 - 145 mmol/L   Potassium 4.0 3.5 - 5.1 mmol/L   Chloride 94 (L) 98 - 111 mmol/L   CO2 26 22 - 32 mmol/L   Glucose, Bld 133 (H) 70 - 99 mg/dL   BUN 35 (H) 8 - 23 mg/dL   Creatinine, Ser 6.41 (H) 0.44 - 1.00 mg/dL   Calcium  10.0 8.9 - 10.3 mg/dL   GFR, Estimated 13 (L) >60 mL/min   Anion gap 11 5 - 15  CBC   Collection Time: 07/24/23 10:53 PM  Result Value Ref Range   WBC 8.5 4.0 - 10.5 K/uL   RBC 3.69 (L) 3.87 - 5.11 MIL/uL   Hemoglobin 11.5 (L) 12.0 - 15.0 g/dL   HCT 63.7 63.9 - 53.9 %   MCV 98.1 80.0 - 100.0 fL   MCH 31.2 26.0 - 34.0 pg   MCHC 31.8 30.0 - 36.0 g/dL   RDW 84.3 (H) 88.4 - 84.4 %   Platelets 90 (L) 150 - 400 K/uL   nRBC 0.0 0.0 - 0.2 %  Lactic acid, plasma    Collection Time: 07/24/23 10:53 PM  Result Value Ref Range   Lactic Acid, Venous 1.7 0.5 - 1.9 mmol/L  Protime-INR   Collection Time: 07/24/23 10:53 PM  Result Value Ref Range   Prothrombin Time 25.9 (H) 11.4 - 15.2 seconds   INR 2.3 (H) 0.8 - 1.2  Blood Culture (routine x 2)   Collection Time: 07/25/23 12:36 AM   Specimen: BLOOD  Result Value Ref Range   Specimen Description BLOOD BLOOD LEFT ARM    Special Requests      BOTTLES DRAWN AEROBIC AND ANAEROBIC Blood Culture adequate volume   Culture      NO GROWTH < 12 HOURS Performed at [facility], [address], [place], [place] [address]    Report Status PENDING   Blood Culture (routine x 2)   Collection Time: 07/25/23 12:36 AM   Specimen: BLOOD  Result Value Ref Range   Specimen Description BLOOD BLOOD LEFT HAND  Special Requests      BOTTLES DRAWN AEROBIC AND ANAEROBIC Blood Culture adequate volume   Culture      NO GROWTH < 12 HOURS Performed at [facility], [address] [place], [place] [address]    Report Status PENDING   Ammonia   Collection Time: 07/25/23 12:36 AM  Result Value Ref Range   Ammonia 43 (H) 9 - 35 umol/L  Vitamin B12   Collection Time: 07/25/23 12:36 AM  Result Value Ref Range   Vitamin B-12 630 180 - 914 pg/mL  TSH   Collection Time: 07/25/23 12:36 AM  Result Value Ref Range   TSH 1.851 0.350 - 4.500 uIU/mL  Blood gas, venous   Collection Time: 07/25/23  3:53 AM  Result Value Ref Range   pH, Ven 7.44 (H) 7.25 - 7.43   pCO2, Ven 42 (L) 44 - 60 mmHg   pO2, Ven 71 (H) 32 - 45 mmHg   Bicarbonate 28.5 (H) 20.0 - 28.0 mmol/L   Acid-Base Excess 3.9 (H) 0.0 - 2.0 mmol/L   O2 Saturation 94.3 %   Patient temperature 37.1    Collection site BLOOD LEFT ARM    Drawn by [id number]   CBG monitoring, ED   Collection Time: 07/25/23  4:32 AM  Result Value Ref Range   Glucose-Capillary 90 70 - 99 mg/dL  CBG monitoring, ED   Collection Time: 07/25/23  8:15 AM  Result Value Ref Range    Glucose-Capillary 102 (H) 70 - 99 mg/dL  CBG monitoring, ED   Collection Time: 07/25/23 10:32 AM  Result Value Ref Range   Glucose-Capillary 125 (H) 70 - 99 mg/dL   *Note: Due to a large number of results and/or encounters for the requested time period, some results have not been displayed. A complete set of results can be found in Results Review.     KYM Louder, MD [facility]   07/24/2023  9:39 PM How to contact the [facility] Attending or Consulting provider 7A - 7P or covering provider during after hours 7P -7A, for this patient?  Check the care team in [facility] and look for a) attending/consulting [facility] provider listed and b) the [facility] team listed Log into [email or web address] and use [facility] universal password to access. If you do not have the password, please contact the hospital operator. Locate the [facility] provider you are looking for under [facility] and page to a number that you can be directly reached. If you still have difficulty reaching the provider, please page the [facility] (Director on Call) for the Hospitalists listed on amion for assistance.

## 2023-07-25 NOTE — Progress Notes (Signed)
  NEPHROLOGY NURSING NOTE:  Serologies obtained from [facility] records  LAB RESULTS - Infectious Diseases Infectious Diseases Result Type Result Value Relevant Reference Range Interpretation Date  HCV Ab (anti-HCV) Nonreactive No Reference Range Provided - May 02, 2023  Hep B Surface Ab (anti-HBs) < 10 mIU/mL No Reference Range Provided - May 02, 2023  Hep B Surface Ag (HBsAg) Negative No Reference Range Provided - July 08, 2023  Hep B core Ab Total (anti-HBc) Negative No Reference Range Provided - July 22, 2023    Jon Laos, RN

## 2023-07-25 NOTE — Progress Notes (Signed)
 Spoke with patient and her daughter about BIPAP tonight. Patient's home machine is set for AVAPS mode. We do not have a machine capable of doing AVAPS mode available for her tonight. She is also very particular about the mask she wears. Patient did fine last night on nasal cannula and the plan is to leave her on nasal cannula tonight and have her home machine and mask brought in tomorrow for use. Family, patient and RN agree with this plan. Told them to call this RT if they changed their mind or if patient's status changed.

## 2023-07-25 NOTE — H&P (Signed)
 History and Physical    [name] [name] FMW:[id number] DOB: [birth year] DOA: 07/24/2023  PCP: Rolinda Millman, MD   Patient coming from: Home   Chief Complaint:  Chief Complaint  Patient presents with   Weakness    HPI:  [name] [name] is a 73 y.o. female with hx of ESRD on M/Tu/Th/F HD, type 1 diabetes on insulin  pump, CAD status post CABG multiple stents, diastolic heart failure, A-fib on anticoagulation, question Nash cirrhosis, RCC status post right nephrectomy, multiple abdominal surgeries small bowel resection, remote history of lymphoma, recent ED visit on 12/26 with episode of near syncope felt to have vertigo, returns today due to similar episode of near syncope and fall (although was caught by her husband) accompanied by confusion.  History mainly provided by patient's daughter at the bedside as patient is too confused to provide history.  Daughter notes that events are similar to episode that occurred on 12/26.  On the prior episode symptoms resolved once coming into the emergency department although did have confusion, issues with speech at that time.    This morning when getting up to go to the bathroom patient became weak and husband caught her while falling to the ground.  She did not lose consciousness.  Had generalized weakness following.  Appears to be confused and not at her baseline per family, speech seeming dysarthric.  Did have a fall prior to 12/26.  Denies any headache, focal numbness, weakness, vision changes.  Recently started on anticoagulation for A-fib.   Otherwise daughter notes, over the past week Since being home from the emergency department has done okay although having intermittent episodes of vertigo thinking possibly orthostatic symptoms per daughter, having nausea, vomiting, diarrhea. Husband worried she is dehydrated.  Has also been having on and off cough since November.    Review of Systems:  ROS complete and negative except as marked above    Allergies  Allergen Reactions   Avandia [Rosiglitazone Maleate] Other (See Comments)    Congestive heart failure   Cephalexin Anaphylaxis, Swelling and Rash    Extremities swell , throat swelling    Clindamycin Anaphylaxis and Shortness Of Breath    Other reaction(s): esophageal spasms   Humalog [Insulin  Lispro] Itching     big knots and whelp    Lincomycin Anaphylaxis and Shortness Of Breath   Metformin Other (See Comments)    Congestive heart failure   Novolog  [Insulin  Aspart (Human Analog)] Hives and Itching    Humalog & Novolog  big knots and whelp     Penicillins Anaphylaxis, Hives, Swelling and Rash    Did it involve swelling of the face/tongue/throat, SOB, or low BP? Yes Did it involve sudden or severe rash/hives, skin peeling, or any reaction on the inside of your mouth or nose? No Did you need to seek medical attention at a hospital or doctor's office? Yes When did it last happen?      Childhood allergy  If all above answers are "NO", may proceed with cephalosporin use.    Tizanidine Other (See Comments)    Dizziness, Mental Status Changes, Hallucination     Adhesive [Tape] Other (See Comments)    Tears skin, Please use paper tape   Atorvastatin Other (See Comments)    increased LFT's, no muscle weakness, leg pain Muscle and joint pain increased LFT's  Increased LFTs   Cholestyramine Other (See Comments)    Liver Disorder Elevated LFTs     Gentamycin [Gentamicin ] Hives, Itching and Rash    Fine  red bumps.  Reaction noted post loop recorder implant, where gentamycin was used for irrigation. Topical prep   Iodinated Contrast Media Other (See Comments)    PAIN DURING LYMPHANGIOGRAM '81, NO ALLERGY TO IV CONTRAST.  Has had procedures with Iodine since without problems.    Limonene Hives, Itching, Rash and Other (See Comments)    Reaction noted post loop recorder implant, where gentamycin was used for irrigation. Topical prep   Prednisone  Itching and Other  (See Comments)    B/P went high, itching all over.  Tolerates with benadryl     Ranexa  [Ranolazine ] Other (See Comments)    Nausea, dizziness, low blood sugar, tingling in right hand, blood blisters on tongue    Rosuvastatin Nausea And Vomiting, Nausea Only and Other (See Comments)    Muscle and joint pain & increased LFTs; tolerates Pravastatin  10mg  (max) Other reaction(s): elevated LFTs   Simvastatin  Other (See Comments)    Increased LFT's    Zinc      Other Reaction(s): Unknown   Acetaminophen      Pt told not to take tylenol  due to liver issues   Azelastine  Other (See Comments)    Other reaction(s): metallic taste,spitting up blood   Clopidogrel  Other (See Comments)    Does not work for patient Medicine was not effective Other reaction(s): Unknown   Kerendia  [Finerenone ] Hives and Nausea Only   Lisinopril  Diarrhea, Nausea Only and Other (See Comments)   Neosporin [Bacitracin -Polymyxin B ] Dermatitis   Codeine Nausea And Vomiting   Erythromycin  Nausea And Vomiting   Hydrocodone -Acetaminophen  Itching, Rash and Other (See Comments)    itching   Latex Itching and Other (See Comments)    Iif wear gloves hands get red ,itchy no prioblem if other wear and touch me   Neomycin Rash and Other (See Comments)    Redness   Nickel Itching   Ranitidine Nausea Only   Tamiflu [Oseltamivir] Nausea And Vomiting   Tramadol Nausea Only    Prior to Admission medications   Medication Sig Start Date End Date Taking? Authorizing Provider  acyclovir  ointment (ZOVIRAX ) 5 % Apply 1 application topically every 3 (three) hours as needed (fever blister/cold sores).    [provider]  ALPRAZolam  (XANAX ) 0.5 MG tablet Take 1 tablet (0.5 mg total) by mouth 2 (two) times daily as needed for anxiety. 01/15/18   Rolinda Millman, MD  APIDRA  100 UNIT/ML injection Inject 40 Units into the skin as directed. INFUSE THROUGH INSULIN  PUMP UTD, Bolus depends on carb intake 02/04/15   [provider]   apixaban  (ELIQUIS ) 5 MG TABS tablet Take 1 tablet (5 mg total) by mouth 2 (two) times daily. 07/12/23   Rolan Ezra RAMAN, MD  aspirin  EC 81 MG tablet Take 81 mg by mouth at bedtime.    [provider]  cetirizine (ZYRTEC) 10 MG tablet Take 10 mg by mouth in the morning.    [provider]  clobetasol  (OLUX ) 0.05 % topical foam Apply 1 application  topically 2 (two) times daily as needed (skin flaking (legs)).    [provider]  Coenzyme Q10 (CO Q 10) 100 MG CAPS Take 100 mg by mouth at bedtime.     [provider]  colchicine  0.6 MG tablet Take 0.6 mg by mouth daily as needed (gout flare).    [provider]  denosumab  (PROLIA ) 60 MG/ML SOSY injection Inject 60 mg into the skin every 6 (six) months.    [provider]  diclofenac  Sodium (VOLTAREN ) 1 %  GEL Apply 2 g topically 4 (four) times daily as needed (pain.).    [provider]  escitalopram  (LEXAPRO ) 5 MG tablet Take 5 mg by mouth at bedtime.    [provider]  famotidine  (PEPCID ) 40 MG tablet Take 40 mg by mouth at bedtime.    [provider]  gabapentin  (NEURONTIN ) 100 MG capsule Take 100 mg by mouth daily.    [provider]  glucagon  (GLUCAGON  EMERGENCY) 1 MG injection Inject 1 mg into the vein once as needed (low blood sugar).    [provider]  isosorbide  mononitrate (IMDUR ) 120 MG 24 hr tablet Take 1 tablet (120 mg total) by mouth every morning. 05/08/23   Rolan Ezra RAMAN, MD  latanoprost  (XALATAN ) 0.005 % ophthalmic solution Place 1 drop into both eyes at bedtime.    [provider]  meclizine  (ANTIVERT ) 25 MG tablet Take 1 tablet (25 mg total) by mouth 2 (two) times daily as needed for dizziness. 07/18/23   Raford Lenis, MD  metolazone  (ZAROXOLYN ) 2.5 MG tablet Take 2.5 mg (1 tab) once as directed by CHF clinic. Call before taking [phone] 05/31/21   Rolan Ezra RAMAN, MD  metoprolol  succinate (TOPROL  XL) 25 MG 24 hr  tablet Take 0.5 tablets (12.5 mg total) by mouth daily. 05/22/23   Rolan Ezra RAMAN, MD  midodrine  (PROAMATINE ) 5 MG tablet Take 1 tablet (5 mg total) by mouth daily as needed (Prior to HD). 05/08/23   Rolan Ezra RAMAN, MD  mometasone  (NASONEX ) 50 MCG/ACT nasal spray Place 1 spray into the nose daily as needed (allergies). 03/10/20   [provider]  mupirocin  cream (BACTROBAN ) 2 % Apply 1 application topically 2 (two) times daily as needed. 02/11/18   Rolinda Millman, MD  nitroGLYCERIN  (NITROLINGUAL ) 0.4 MG/SPRAY spray PLACE 1 SPRAY UNDER TH TONGUE EVERY 5 MINUTES FOR 3 DOSES AS NEEDED FOR CHEST PAIN 12/25/22   Rolan Ezra RAMAN, MD  polyethylene glycol (MIRALAX  / GLYCOLAX ) 17 g packet Take 17 g by mouth in the morning. Hold for loose stools    [provider]  potassium chloride  SA (KLOR-CON  M) 20 MEQ tablet Take 1 tablet (20 mEq total) by mouth 2 (two) times daily. 07/18/23   Raford Lenis, MD  pravastatin  (PRAVACHOL ) 10 MG tablet TAKE ONE TABLET (10 MG TOTAL) BY MOUTH DAILY. 04/23/23   Rolan Ezra RAMAN, MD  Probiotic Product (ALIGN PO) Take 1 tablet by mouth daily in the afternoon.    [provider]  REPATHA  SURECLICK 140 MG/ML SOAJ INJECT 140MG  INTO THE SKIN EVERY 14 DAYS 03/29/23   Rolan Ezra RAMAN, MD  torsemide  (DEMADEX ) 20 MG tablet Take 40 mg Twice daily on Wed,sat and sun 05/08/23   Rolan Ezra RAMAN, MD  ULORIC  40 MG tablet Take 1 tablet (40 mg total) by mouth at bedtime. 11/29/17   Rolinda Millman, MD  VASCEPA  1 g capsule TAKE TWO CAPSULES (2 GRAMS TOTAL) BY MOUTH TWO TIMES DAILY 02/26/23   Rolan Ezra RAMAN, MD    Past Medical History:  Diagnosis Date   Anxiety    Aortic stenosis    a. bicuspid aortic valve; mean gradient of 20 mmHg in 2/10; b. s/p bioprosth AVR (19-mm Edwards pericardial valve) with a redo coronary artery bypass graft procedure in 07/2009; postoperative Dressler's syndrome     Arthritis    Breast cancer (HCC)    Carcinoma, renal cell (HCC) 05/2007    Laparoscopic right nephrectomy   Chronic diastolic CHF (congestive heart failure) (HCC)  a. 9.2014 EchoP EF 60-65%, no rwma, bioprosth AVR, mean gradient of , mild to mod MR, PASP .   CKD (chronic kidney disease), stage IV (HCC)    Colitis, ischemic (HCC) 2008   Complication of anesthesia    Coronary artery disease    a. initial CABG with SVG-RCA in 12/05. b. redo SVG-RCA and bioprosthetic AVR in 1/11. d. 9/14 - PCI with DES to distal LM/proximal LAD was done rather than bypass as she would have been a poor candidate for redo sternotomy. d. S/p DES to prox LM 05/2014.   Diabetes mellitus 03/2010    TYPE II: Hemoglobin A1c of 7.4 in  03/2010; 8.4 in 06/2010; treated with insulin  pump   Duodenal ulcer    Remote; H. pylori positive   Dyspnea    Gastroparesis due to DM (HCC) 05/01/2012   Glaucoma 1998   Hodgkin's disease (HCC) 1991   Mantle radiation therapy   Hyperlipidemia    Hyperplastic gastric polyp 06/23/2012   Hypertension    Iron  deficiency anemia    a. 03/2013 EGD: essentially normal. Possible slow GIB.   Migraines    NASH (nonalcoholic steatohepatitis) 1999   -biopsy in 1999   OSA treated with BiPAP    Osteoporosis    Pacemaker    six sinus syndrome   PAF (paroxysmal atrial fibrillation) (HCC)    a.  h/o brief episodes noted on ppm interrogation. b. given GI bleeding and need to be on both ASA 81 and Brilinta , risks likely outweigh benefits of anticoagulation.    PONV (postoperative nausea and vomiting)    Small bowel obstruction (HCC)    Recurrent; resolved after resection of a lipoma   Syncope    a. H/o recurrent syncope with pauses on loop recorder s/p Medtronic pacemaker 07/2012.    Past Surgical History:  Procedure Laterality Date   ABDOMINAL HYSTERECTOMY  2000   AORTIC VALVE REPLACEMENT  2011   Aortic valve replacement surgery with a 19 mm bioprosthetic device post redo CABG January 2011.   AV FISTULA PLACEMENT Right 05/01/2023   Procedure: RIGHT  UPPER ARM BRACHIOBASILIC ARTERIOVENOUS FISTULA CREATION;  Surgeon: Serene Gaile ORN, MD;  Location: [facility] OR;  Service: Vascular;  Laterality: Right;   BONE MARROW BIOPSY     BREAST EXCISIONAL BIOPSY     benign, bilateral   CARDIAC CATHETERIZATION     CERVICAL BIOPSY     COLONOSCOPY     Multiple with adenomatous polyps   COLONOSCOPY  06/23/2012   Procedure: COLONOSCOPY;  Surgeon: Lupita FORBES Commander, MD;  Location: [facility] ENDOSCOPY;  Service: Endoscopy;  Laterality: N/A;   CORONARY ARTERY BYPASS GRAFT  2005, 2011   CABG-most recent SVG to RCA-12/05; RCA occluded with patent graft in 2006; Redo bypass in 07/2009   ESOPHAGOGASTRODUODENOSCOPY     ESOPHAGOGASTRODUODENOSCOPY  06/23/2012   Procedure: ESOPHAGOGASTRODUODENOSCOPY (EGD);  Surgeon: Lupita FORBES Commander, MD;  Location: [facility];  Service: Endoscopy;  Laterality: N/A;   ESOPHAGOGASTRODUODENOSCOPY N/A 04/06/2013   Procedure: ESOPHAGOGASTRODUODENOSCOPY (EGD);  Surgeon: Norleen LOISE Kiang, MD;  Location: [facility] ENDOSCOPY;  Service: Endoscopy;  Laterality: N/A;   EXPLORATORY LAPAROTOMY W/ BOWEL RESECTION     Small bowel resection of lipoma   GIVENS CAPSULE STUDY N/A 04/30/2013   Procedure: GIVENS CAPSULE STUDY;  Surgeon: Lupita FORBES Commander, MD;  Location: [facility] ENDOSCOPY;  Service: Endoscopy;  Laterality: N/A;   INSERTION OF DIALYSIS CATHETER Right 05/01/2023   Procedure: INSERTION OF TUNNELED DIALYSIS CATHETER USING PALINDROME CATHETER 23CM;  Surgeon: Serene Gaile  W, MD;  Location: [facility] OR;  Service: Vascular;  Laterality: Right;   LEFT AND RIGHT HEART CATHETERIZATION WITH CORONARY ANGIOGRAM N/A 04/07/2013   Procedure: LEFT AND RIGHT HEART CATHETERIZATION WITH CORONARY ANGIOGRAM;  Surgeon: Ezra GORMAN Shuck, MD;  Location: [facility] CATH LAB;  Service: Cardiovascular;  Laterality: N/A;   LEFT ATRIAL APPENDAGE OCCLUSION N/A 02/16/2016   Watchman not placed - nickel allergy   LEFT HEART CATHETERIZATION WITH CORONARY/GRAFT ANGIOGRAM N/A 06/07/2014   Procedure: LEFT HEART  CATHETERIZATION WITH EL BILE;  Surgeon: Lonni JONETTA Cash, MD;  Location: [facility] CATH LAB;  Service: Cardiovascular;  Laterality: N/A;   LIVER BIOPSY     LOOP RECORDER IMPLANT N/A 12/10/2011   Procedure: LOOP RECORDER IMPLANT;  Surgeon: Danelle LELON Birmingham, MD;  Location: [facility] CATH LAB;  Service: Cardiovascular;  Laterality: N/A;   MALONEY DILATION  06/23/2012   Procedure: AGAPITO DILATION;  Surgeon: Lupita FORBES Commander, MD;  Location: [facility] ENDOSCOPY;  Service: Endoscopy;;  56 fr   MASTECTOMY Left 05/17/2015   total - no lymph nodes removed per patient   NEPHRECTOMY  2008   Laparoscopic right nephrectomy, renal cell carcinoma   PACEMAKER INSERTION  08/21/2012   Medtronic Adapta L dual-chamber pacemaker   PERCUTANEOUS CORONARY STENT INTERVENTION (PCI-S) N/A 04/09/2013   Procedure: PERCUTANEOUS CORONARY STENT INTERVENTION (PCI-S);  Surgeon: Lonni JONETTA Cash, MD;  Location: [facility] CATH LAB;  Service: Cardiovascular;  Laterality: N/A;   PERMANENT PACEMAKER INSERTION N/A 08/21/2012   Procedure: PERMANENT PACEMAKER INSERTION;  Surgeon: Danelle LELON Birmingham, MD;  Location: [facility] CATH LAB;  Service: Cardiovascular;  Laterality: N/A;   RIGHT HEART CATH N/A 03/18/2019   Procedure: RIGHT HEART CATH;  Surgeon: Shuck Ezra GORMAN, MD;  Location: [facility] INVASIVE CV LAB;  Service: Cardiovascular;  Laterality: N/A;   RIGHT HEART CATH N/A 08/21/2022   Procedure: RIGHT HEART CATH;  Surgeon: Shuck Ezra GORMAN, MD;  Location: [facility] INVASIVE CV LAB;  Service: Cardiovascular;  Laterality: N/A;   RIGHT HEART CATH AND CORONARY/GRAFT ANGIOGRAPHY N/A 04/19/2017   Procedure: RIGHT HEART CATH AND CORONARY/GRAFT ANGIOGRAPHY;  Surgeon: Shuck Ezra GORMAN, MD;  Location: [facility] INVASIVE CV LAB;  Service: Cardiovascular;  Laterality: N/A;   TEE WITHOUT CARDIOVERSION N/A 12/07/2015   Procedure: TRANSESOPHAGEAL ECHOCARDIOGRAM (TEE);  Surgeon: Ezra GORMAN Shuck, MD;  Location: [facility] ENDOSCOPY;  Service: Cardiovascular;  Laterality: N/A;   TOTAL  ABDOMINAL HYSTERECTOMY W/ BILATERAL SALPINGOOPHORECTOMY  2000   endometriosis and ovarian cysts   TOTAL MASTECTOMY Left 05/17/2015   Procedure: LEFT TOTAL MASTECTOMY;  Surgeon: Donnice Bury, MD;  Location: [facility] OR;  Service: General;  Laterality: Left;   TUMOR EXCISION  1981   removal of Hodgkins lymphoma     reports that she has never smoked. She has never used smokeless tobacco. She reports that she does not drink alcohol and does not use drugs.  Family History  Problem Relation Age of Onset   Heart attack Mother        d.61   Heart disease Mother    Anal fissures Mother    Heart attack Father        d.60   Diabetes Father    Heart disease Father    Lung cancer Sister        d.61 metastases to brain. History of smoking. Maternal half-sister.   Cancer Sister 30       unspecified gynecologic cancer (either cervical or ovarian) in her 30s.   Breast cancer Sister 55       d.42 from  metastatic disease. Paternal half-sister.   Hypertension Brother    Glaucoma Brother    Parkinson's disease Paternal Aunt    Heart attack Maternal Grandmother    Cancer Maternal Grandfather 50       d.52s oral cancer   Colon cancer Neg Hx    Stomach cancer Neg Hx    Parkinsonism Neg Hx      Physical Exam: Vitals:   07/25/23 0330 07/25/23 0345 07/25/23 0400 07/25/23 0415  BP:  102/84 103/61 (!) 96/59  Pulse: (!) 122 (!) 138 (!) 133 (!) 119  Resp: 20 (!) 21 (!) 23 17  Temp:      TempSrc:      SpO2: 99% 99% 97% 96%  Height:        Gen: Awake, alert, acutely ill-appearing CV: Tachycardic, irregular, normal S1, S2, 2/6 SEM Resp: Shallow breathing, rales and rhonchi on the right greater than left posterior fields, diminished in the right base. Abd: Round slightly distended, normoactive, nontender MSK: Symmetric, no edema  Skin: No rashes or lesions to exposed skin  Neuro: Alert and interactive, oriented to self, hospital (not to [facility], [facility]), and 2025.  CN II through XII intact,  motor is 5 out of 5 and symmetric, sensation is intact and equal to fine touch.  Positive asterixis Psych: Appears encephalopathic   Data review:   Labs reviewed, notable for:   Lactate 1.7 NA 131 Prn 3.5 Creatinine 3.5 (history ESRD) WBC within normal limits Platelet 90, similar to prior INR 2.3 Ammonia 43 VBG no hypercarbia Flu a positive  Micro:  Results for orders placed or performed during the hospital encounter of 07/24/23  Resp panel by RT-PCR (RSV, Flu A&B, Covid) Anterior Nasal Swab     Status: Abnormal   Collection Time: 07/24/23  9:55 PM   Specimen: Anterior Nasal Swab  Result Value Ref Range Status   SARS Coronavirus 2 by RT PCR NEGATIVE NEGATIVE Final    Comment: (NOTE) SARS-CoV-2 target nucleic acids are NOT DETECTED.  The SARS-CoV-2 RNA is generally detectable in upper respiratory specimens during the acute phase of infection. The lowest concentration of SARS-CoV-2 viral copies this assay can detect is 138 copies/mL. A negative result does not preclude SARS-Cov-2 infection and should not be used as the sole basis for treatment or other patient management decisions. A negative result may occur with  improper specimen collection/handling, submission of specimen other than nasopharyngeal swab, presence of viral mutation(s) within the areas targeted by this assay, and inadequate number of viral copies(<138 copies/mL). A negative result must be combined with clinical observations, patient history, and epidemiological information. The expected result is Negative.  Fact Sheet for Patients:  bloggercourse.com  Fact Sheet for Healthcare Providers:  seriousbroker.it  This test is no t yet approved or cleared by the [place]  FDA and  has been authorized for detection and/or diagnosis of SARS-CoV-2 by FDA under an Emergency Use Authorization (EUA). This EUA will remain  in effect (meaning this test can be  used) for the duration of the COVID-19 declaration under Section 564(b)(1) of the Act, 21 U.S.C.section 360bbb-3(b)(1), unless the authorization is terminated  or revoked sooner.       Influenza A by PCR POSITIVE (A) NEGATIVE Final   Influenza B by PCR NEGATIVE NEGATIVE Final    Comment: (NOTE) The Xpert Xpress SARS-CoV-2/FLU/RSV plus assay is intended as an aid in the diagnosis of influenza from Nasopharyngeal swab specimens and should not be used as a sole basis for  treatment. Nasal washings and aspirates are unacceptable for Xpert Xpress SARS-CoV-2/FLU/RSV testing.  Fact Sheet for Patients: bloggercourse.com  Fact Sheet for Healthcare Providers: seriousbroker.it  This test is not yet approved or cleared by the [place]  FDA and has been authorized for detection and/or diagnosis of SARS-CoV-2 by FDA under an Emergency Use Authorization (EUA). This EUA will remain in effect (meaning this test can be used) for the duration of the COVID-19 declaration under Section 564(b)(1) of the Act, 21 U.S.C. section 360bbb-3(b)(1), unless the authorization is terminated or revoked.     Resp Syncytial Virus by PCR NEGATIVE NEGATIVE Final    Comment: (NOTE) Fact Sheet for Patients: bloggercourse.com  Fact Sheet for Healthcare Providers: seriousbroker.it  This test is not yet approved or cleared by the [place]  FDA and has been authorized for detection and/or diagnosis of SARS-CoV-2 by FDA under an Emergency Use Authorization (EUA). This EUA will remain in effect (meaning this test can be used) for the duration of the COVID-19 declaration under Section 564(b)(1) of the Act, 21 U.S.C. section 360bbb-3(b)(1), unless the authorization is terminated or revoked.  Performed at [facility], [address], [place], [place] [address]   Blood Culture (routine x 2)     Status: None  (Preliminary result)   Collection Time: 07/25/23 12:36 AM   Specimen: Blood  Result Value Ref Range Status   Specimen Description BLOOD BLOOD LEFT ARM  Final   Special Requests   Final    BOTTLES DRAWN AEROBIC AND ANAEROBIC Blood Culture adequate volume Performed at [facility], [address], [place], [place] [address]    Culture PENDING  Incomplete   Report Status PENDING  Incomplete  Blood Culture (routine x 2)     Status: None (Preliminary result)   Collection Time: 07/25/23 12:36 AM   Specimen: Blood  Result Value Ref Range Status   Specimen Description BLOOD BLOOD LEFT HAND  Final   Special Requests   Final    BOTTLES DRAWN AEROBIC AND ANAEROBIC Blood Culture adequate volume Performed at [facility], [address] [place], [place] [address]    Culture PENDING  Incomplete   Report Status PENDING  Incomplete   *Note: Due to a large number of results and/or encounters for the requested time period, some results have not been displayed. A complete set of results can be found in Results Review.    Imaging reviewed:  CT HEAD WO CONTRAST ( ) Result Date: 07/25/2023 CLINICAL DATA:  AMS, dysarthria. Hx fall on AC. r/o ICH EXAM: CT HEAD WITHOUT CONTRAST TECHNIQUE: Contiguous axial images were obtained from the base of the skull through the vertex without intravenous contrast. RADIATION DOSE REDUCTION: This exam was performed according to the departmental dose-optimization program which includes automated exposure control, adjustment of the mA and/or kV according to patient size and/or use of iterative reconstruction technique. COMPARISON:  CT head November 15, 2016. FINDINGS: Brain: No evidence of acute infarction, hemorrhage, hydrocephalus, extra-axial collection or mass lesion/mass effect. Vascular: No hyperdense vessel identified. Skull: No acute fracture. Sinuses/Orbits: Clear sinuses.  No acute orbital findings. Other: No mastoid effusions. IMPRESSION: No evidence of acute intracranial  abnormality. Electronically Signed   By: Gilmore GORMAN Molt M.D.   On: 07/25/2023 03:43   DG Chest Portable 1 View Result Date: 07/24/2023 CLINICAL DATA:  new cough EXAM: PORTABLE CHEST 1 VIEW COMPARISON:  Chest x-ray 05/01/2023 FINDINGS: Left chest wall dual lead pacemaker. Right chest wall dialysis catheter with tip overlying the right atrium. The heart  and mediastinal contours are unchanged. Atherosclerotic plaque. Coronary artery stents. Interval development of likely loculated moderate volume right pleural effusion. Diffuse hazy airspace opacity of the right lung. No pulmonary edema. No left pleural effusion. Left lung grossly unremarkable. No pneumothorax. No acute osseous abnormality. IMPRESSION: 1. Interval development of likely loculated moderate volume right pleural effusion. Diffuse hazy airspace opacity of the right lung may represent infection/inflammation versus atelectasis. 2.  Aortic Atherosclerosis (ICD10-I70.0). Electronically Signed   By: Morgane  Naveau M.D.   On: 07/24/2023 22:30     ED Course:  Treated with Tylenol  suppository with fever   Assessment/Plan:  73 y.o. female with hx ESRD on M/Tu/Th/F HD, type 1 diabetes on insulin  pump, CAD status post CABG multiple stents, diastolic heart failure, A-fib on anticoagulation, question Nash cirrhosis, RCC status post right nephrectomy, multiple abdominal surgeries small bowel resection, remote history of lymphoma, recent ED visit on 12/26 with episode of near syncope felt to have vertigo, returns today due to similar episode of near syncope and fall (although was caught by her husband) accompanied by confusion. Found to have influenza A pneumonia, hypoxic respiratory failure   Influenza a pneumonia Loculated right pleural effusion Acute hypoxic respiratory failure, secondary to above Sepsis likely secondary to viral illness, present on admission.  Reports subacute ongoing on and off cough without significant change, although recently  having GI symptoms, decreased p.o. intake.  Febrile 38.5, tachypneic, tachycardic developing A-fib RVR. Desatted to 85% on room air, initially on 2 L although due to desat increased to 6 L O2.  Influenza A positive. Chest x-ray with interval development of right-sided moderate likely loculated effusion and right-sided hazy airspace disease. -Prefer admission to [facility] so that she can have IR services available for thoracentesis -Intolerance of Tamiflu, will hold off -Start levofloxacin  750 mg p.o. x 1, then 500 mg every 48 hours (renal dosing) to cover for possible bacterial pneumonia -Status post 500 cc IV fluids in the ED, since lactate is normal hold off on large-volume fluid resuscitation in setting of her ESRD. -Follow-up blood cultures, suspected sepsis from viral cause -Evaluation by IR for thoracentesis once at [facility], note she is currently on anticoagulation.  Continued anticoagulation for now without a definite plan for thoracentesis -Xopenex  INH as needed, incentive spirometry, flutter valve, encourage out of bed to chair.  Encephalopathy, acute Possible delirium I/s/o influenza, mild hyperammonemia, or med effect  Noted to have disorientation, some garbled speech/dysarthria, + hallucinations seeing smurfs, although no focal findings.  Suspect delirium in the setting of underlying influenza pneumonia, hypoxia. Additional causes may include decompensated Hollie cirrhosis with mild hepatic encephalopathy in setting of positive asterixis, mild hyperammonemia at 43. Possibly component of medication effects (benzodiazepine, Gabapentin ). Other evaluation unrevealing including chemistries (mild hypo Na at 131 not contributing), TSH wnl, B12 wnl, VBG no hypercarbia, and CT head with no acute intracranial abnormalities.  - Management of influenza pneumonia, hypoxemia per above - Start lactulose  twice daily given recent diarrhea, target 3-4 bowel movements daily - If encephalopathy continues  could consider rifaximin - Hold gabapentin  for now.  Continue her home Xanax  nightly to avoid withdrawal. - Delirium precautions, fall precautions  A-fib with RVR Developed A-fib with RVR into the 130s in the emergency department. - Status post 500 cc IV fluid with minimal improvement. - Give metoprolol  2.5 mg IV x 1, and start home dose metoprolol  succinate 12.5 mg daily - Given borderline hypotension start midodrine  5 mg p.o. 3 times daily scheduled which may help  uptitrating beta-blockers if needed -Continue home Eliquis  5 mg twice daily  Type 1 diabetes, encephalopathy limiting ability to use insulin  pump Chronically on insulin  pump. per last endocrinology notes TDD insulin  52 units, bolus 14 units, off pump recommendation for 38 units Lantus  as backup with Apidra  per ICR.  - Discontinued her insulin  pump for now given encephalopathy - Start Semglee  38 units every 24 hours, for correctional may have to consider insulin  regular if not able to resume pump. Hx allergy to humalog/novolog   - If encephalopathy improving can resume her home insulin  pump for bolus for meals - DM educator consult to assist  Mild hyponatremia Suspect recently hypovolemic with low solute by history -Trend BMP  ? 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 P/w encephalopathy found to have + asterixis and mild hyperammonemia.  -Start lactulose  twice daily, consider rifaximin if no improvement in encephalopathy. -Follow-up with hepatology outpatient  Chronic medical problems: ESRD on M/Tu/Th/F HD: Needs routine nephrology consult in AM for HD, not contacted overnight. Started on scheduled Midodrine  per above. Typically takes 10 mg total prior / after HD. For now added additional 5 mg on HD days in addition to scheduled midodrine  (5 mg TID).  CAD history of CABG, multiple PCI / HLD: Continue aspirin  in addition to anticoagulation with Eliquis , pravastatin . Hold home ISMN  in setting of borderline hypotension. On Repatha , Vascepa  OP.  HFpEF: Volume management via HD Mood disorder: Continue home Xanax  0.5 mg nightly, escitalopram   neuropathy: Holding gabapentin  given encephalopathy. Gastroparesis: Not on medication management  GERD: Continue home Famotidine   History RCC, right nephrectomy: Outpatient surveillance Remote history lymphoma: outpatient surveillance  Body mass index is 20.85 kg/m.    DVT prophylaxis:  Eliquis  Code Status:  Full Code Diet:  Diet Orders (From admission, onward)     Start     Ordered   07/25/23 0322  Diet Carb Modified Fluid consistency: Thin; Room service appropriate? Yes  Diet effective now       Question Answer Comment  Diet-HS Snack? Nothing   Calorie Level Medium 1600-2000   Fluid consistency: Thin   Room service appropriate? Yes      07/25/23 0329           Family Communication:  Yes discussed with daughter at bedside   Consults:  None   Admission status:   Inpatient, Step Down Unit  Severity of Illness: The appropriate patient status for this patient is INPATIENT. Inpatient status is judged to be reasonable and necessary in order to provide the required intensity of service to ensure the patient's safety. The patient's presenting symptoms, physical exam findings, and initial radiographic and laboratory data in the context of their chronic comorbidities is felt to place them at high risk for further clinical deterioration. Furthermore, it is not anticipated that the patient will be medically stable for discharge from the hospital within 2 midnights of admission.   * I certify that at the point of admission it is my clinical judgment that the patient will require inpatient hospital care spanning beyond 2 midnights from the point of admission due to high intensity of service, high risk for further deterioration and high frequency of surveillance required.*   Dorn Dawson, MD [facility]  How to contact  the [facility] Attending or Consulting provider 7A - 7P or covering provider during after hours 7P -7A, for this patient.  Check the care team in [facility] and look for a) attending/consulting [facility] provider listed and  b) the [facility] team listed Log into [email or web address] and use [facility]'s universal password to access. If you do not have the password, please contact the hospital operator. Locate the [facility] provider you are looking for under [facility] and page to a number that you can be directly reached. If you still have difficulty reaching the provider, please page the [facility] (Director on Call) for the Hospitalists listed on amion for assistance.  07/25/2023, 4:42 AM

## 2023-07-25 NOTE — ED Notes (Signed)
 Notified Dr. Lazarus Salines that patients HR is still persistently in the 120s-130s. Per MD to keep monitoring. He is ok with HR < than 130.

## 2023-07-25 NOTE — Consult Note (Deleted)
 Cardiology Consultation   Patient ID: [name] [name] MRN: [id number]; DOB: 09/29/1950  Admit date: 07/24/2023 Date of Consult: 07/25/2023  PCP:  Rolinda Millman, MD    HeartCare Providers Cardiologist:  None  Electrophysiologist:  Danelle Birmingham, MD  Advanced Heart Failure:  Ezra Shuck, MD  Sleep Medicine:  Wilbert Bihari, MD       Patient Profile:   [name] [name] is a 73 y.o. female with a hx of CAD (s/p CABG in 2005 with redo CABG in 2011 with SVG-RCA, cath in 2014 showing 80% distal LM/ostial LAD stenosis and not felt to be a candidate for redo CABG given 2 prior sternotomies and prior radiation for Hodgkin's lymphoma --> underwent DES to LM and proximal LAD, DES to ostial LM in 2015, cath in 2018 showing 50% distal left main ISR and medical management recommended), bioprosthetic AVR, chronic HFpEF, SSS (s/p PPM placement), paroxysmal atrial fibrillation, HTN, HLD, Type I DM, OSA, history of ischemic colitis, history of breast cancer (status post bilateral mastectomy in 04/2015), 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 Vicci.  History of Present Illness:   [name] was examined by Dr. Shuck in 06/2023 and her Apple Watch had been showing more frequent episodes of atrial fibrillation. She also reported shortness of breath with walking longer distances and chest tightness with exertion. Volume was being managed by dialysis and she was taking torsemide  40 mg twice daily on nondialysis days.  Recent cardiac PET in 04/2023 showed an inferior lateral partially reversible defect which corresponded to her known LCx disease and there were no good options for intervention, therefore was recommended to hold off on repeat cardiac catheterization.  She had been on Brilinta  as she was a poor responder to Plavix   given recurrent LM disease.  However due to her having more frequent episodes of atrial fibrillation, Brilinta  was discontinued and she was switched to Eliquis  5 mg twice daily for anticoagulation. Given that she was symptomatic with atrial fibrillation, the option of adding amiodarone  was reviewed but she wished to hold off at that time.  She presented to [facility] ED on 07/24/2023 for evaluation of worsening weakness and decreased appetite over the past several days.  Patient's daughter reports that she has been feeling significantly weak for the past several days and having episodes of presyncope.  The patient think she actually lost consciousness yesterday. Reports frequent nausea and vomiting over the past several days as well and is unsure if she was able to keep down her medications.  Initial labs showed WBC 8.5, Hgb 11.5, platelets 90, Na+ 131, K+ 4.0 and creatinine 3.58.  Positive for influenza A.  Blood cultures pending.  CXR showed a loculated moderate volume right pleural effusion and hazy airspace opacity of the right lung which may represent infection or inflammation vs atelectasis. CT Head showed no acute intracranial abnormalities.  EKG showed sinus tachycardia heart rate 103 with LVH and associated repolarization abnormalities.  She was admitted for further management and is awaiting a bed at [facility]. She was started on levofloxacin  to cover PNA and given her prior intolerance to Tamiflu, this was not prescribed.  While in the ED, she was noted to have episodes of atrial fibrillation with RVR and received IV metoprolol  2.5 mg x 1 with plans to restart Toprol -XL 12.5 mg daily.  She has been continued on midodrine  5 mg 3 times daily.  Past Medical History:  Diagnosis Date   Anxiety    Aortic stenosis    a. bicuspid aortic valve; mean gradient of 20 mmHg in 2/10; b. s/p bioprosth AVR (19-mm Edwards pericardial valve) with a redo coronary artery bypass graft procedure in 07/2009;  postoperative Dressler's syndrome     Arthritis    Breast cancer (HCC)    Carcinoma, renal cell (HCC) 05/2007   Laparoscopic right nephrectomy   Chronic diastolic CHF (congestive heart failure) (HCC)    a. 9.2014 EchoP EF 60-65%, no rwma, bioprosth AVR, mean gradient of , mild to mod MR, PASP .   CKD (chronic kidney disease), stage IV (HCC)    Colitis, ischemic (HCC) 2008   Complication of anesthesia    Coronary artery disease    a. initial CABG with SVG-RCA in 12/05. b. redo SVG-RCA and bioprosthetic AVR in 1/11. d. 9/14 - PCI with DES to distal LM/proximal LAD was done rather than bypass as she would have been a poor candidate for redo sternotomy. d. S/p DES to prox LM 05/2014.   Diabetes mellitus 03/2010    TYPE II: Hemoglobin A1c of 7.4 in  03/2010; 8.4 in 06/2010; treated with insulin  pump   Duodenal ulcer    Remote; H. pylori positive   Dyspnea    Gastroparesis due to DM (HCC) 05/01/2012   Glaucoma 1998   Hodgkin's disease (HCC) 1991   Mantle radiation therapy   Hyperlipidemia    Hyperplastic gastric polyp 06/23/2012   Hypertension    Iron  deficiency anemia    a. 03/2013 EGD: essentially normal. Possible slow GIB.   Migraines    NASH (nonalcoholic steatohepatitis) 1999   -biopsy in 1999   OSA treated with BiPAP    Osteoporosis    Pacemaker    six sinus syndrome   PAF (paroxysmal atrial fibrillation) (HCC)    a.  h/o brief episodes noted on ppm interrogation. b. given GI bleeding and need to be on both ASA 81 and Brilinta , risks likely outweigh benefits of anticoagulation.    PONV (postoperative nausea and vomiting)    Small bowel obstruction (HCC)    Recurrent; resolved after resection of a lipoma   Syncope    a. H/o recurrent syncope with pauses on loop recorder s/p Medtronic pacemaker 07/2012.    Past Surgical History:  Procedure Laterality Date   ABDOMINAL HYSTERECTOMY  2000   AORTIC VALVE REPLACEMENT  2011   Aortic valve replacement surgery with a 19  mm bioprosthetic device post redo CABG January 2011.   AV FISTULA PLACEMENT Right 05/01/2023   Procedure: RIGHT UPPER ARM BRACHIOBASILIC ARTERIOVENOUS FISTULA CREATION;  Surgeon: Serene Gaile ORN, MD;  Location: [facility] OR;  Service: Vascular;  Laterality: Right;   BONE MARROW BIOPSY     BREAST EXCISIONAL BIOPSY     benign, bilateral   CARDIAC CATHETERIZATION     CERVICAL BIOPSY     COLONOSCOPY     Multiple with adenomatous polyps   COLONOSCOPY  06/23/2012   Procedure: COLONOSCOPY;  Surgeon: Lupita FORBES Commander, MD;  Location: [facility] ENDOSCOPY;  Service: Endoscopy;  Laterality: N/A;   CORONARY ARTERY BYPASS GRAFT  2005, 2011   CABG-most recent SVG to RCA-12/05; RCA occluded with patent graft in 2006; Redo bypass in 07/2009   ESOPHAGOGASTRODUODENOSCOPY     ESOPHAGOGASTRODUODENOSCOPY  06/23/2012   Procedure: ESOPHAGOGASTRODUODENOSCOPY (EGD);  Surgeon: Lupita FORBES Commander, MD;  Location: [facility];  Service: Endoscopy;  Laterality: N/A;   ESOPHAGOGASTRODUODENOSCOPY N/A 04/06/2013   Procedure: ESOPHAGOGASTRODUODENOSCOPY (EGD);  Surgeon: Norleen LOISE Kiang, MD;  Location: [facility] ENDOSCOPY;  Service: Endoscopy;  Laterality: N/A;   EXPLORATORY LAPAROTOMY W/ BOWEL RESECTION     Small bowel resection of lipoma   GIVENS CAPSULE STUDY N/A 04/30/2013   Procedure: GIVENS CAPSULE STUDY;  Surgeon: Lupita FORBES Commander, MD;  Location: [facility] ENDOSCOPY;  Service: Endoscopy;  Laterality: N/A;   INSERTION OF DIALYSIS CATHETER Right 05/01/2023   Procedure: INSERTION OF TUNNELED DIALYSIS CATHETER USING PALINDROME CATHETER 23CM;  Surgeon: Serene Gaile ORN, MD;  Location: [facility] OR;  Service: Vascular;  Laterality: Right;   LEFT AND RIGHT HEART CATHETERIZATION WITH CORONARY ANGIOGRAM N/A 04/07/2013   Procedure: LEFT AND RIGHT HEART CATHETERIZATION WITH CORONARY ANGIOGRAM;  Surgeon: Ezra GORMAN Shuck, MD;  Location: [facility] CATH LAB;  Service: Cardiovascular;  Laterality: N/A;   LEFT ATRIAL APPENDAGE OCCLUSION N/A 02/16/2016   Watchman not placed - nickel  allergy   LEFT HEART CATHETERIZATION WITH CORONARY/GRAFT ANGIOGRAM N/A 06/07/2014   Procedure: LEFT HEART CATHETERIZATION WITH EL BILE;  Surgeon: Lonni JONETTA Cash, MD;  Location: [facility] CATH LAB;  Service: Cardiovascular;  Laterality: N/A;   LIVER BIOPSY     LOOP RECORDER IMPLANT N/A 12/10/2011   Procedure: LOOP RECORDER IMPLANT;  Surgeon: Danelle ORN Birmingham, MD;  Location: [facility] CATH LAB;  Service: Cardiovascular;  Laterality: N/A;   MALONEY DILATION  06/23/2012   Procedure: AGAPITO DILATION;  Surgeon: Lupita FORBES Commander, MD;  Location: [facility] ENDOSCOPY;  Service: Endoscopy;;  56 fr   MASTECTOMY Left 05/17/2015   total - no lymph nodes removed per patient   NEPHRECTOMY  2008   Laparoscopic right nephrectomy, renal cell carcinoma   PACEMAKER INSERTION  08/21/2012   Medtronic Adapta L dual-chamber pacemaker   PERCUTANEOUS CORONARY STENT INTERVENTION (PCI-S) N/A 04/09/2013   Procedure: PERCUTANEOUS CORONARY STENT INTERVENTION (PCI-S);  Surgeon: Lonni JONETTA Cash, MD;  Location: [facility] CATH LAB;  Service: Cardiovascular;  Laterality: N/A;   PERMANENT PACEMAKER INSERTION N/A 08/21/2012   Procedure: PERMANENT PACEMAKER INSERTION;  Surgeon: Danelle ORN Birmingham, MD;  Location: [facility] CATH LAB;  Service: Cardiovascular;  Laterality: N/A;   RIGHT HEART CATH N/A 03/18/2019   Procedure: RIGHT HEART CATH;  Surgeon: Shuck Ezra GORMAN, MD;  Location: [facility] INVASIVE CV LAB;  Service: Cardiovascular;  Laterality: N/A;   RIGHT HEART CATH N/A 08/21/2022   Procedure: RIGHT HEART CATH;  Surgeon: Shuck Ezra GORMAN, MD;  Location: [facility] INVASIVE CV LAB;  Service: Cardiovascular;  Laterality: N/A;   RIGHT HEART CATH AND CORONARY/GRAFT ANGIOGRAPHY N/A 04/19/2017   Procedure: RIGHT HEART CATH AND CORONARY/GRAFT ANGIOGRAPHY;  Surgeon: Shuck Ezra GORMAN, MD;  Location: [facility] INVASIVE CV LAB;  Service: Cardiovascular;  Laterality: N/A;   TEE WITHOUT CARDIOVERSION N/A 12/07/2015   Procedure: TRANSESOPHAGEAL ECHOCARDIOGRAM (TEE);   Surgeon: Ezra GORMAN Shuck, MD;  Location: [facility] ENDOSCOPY;  Service: Cardiovascular;  Laterality: N/A;   TOTAL ABDOMINAL HYSTERECTOMY W/ BILATERAL SALPINGOOPHORECTOMY  2000   endometriosis and ovarian cysts   TOTAL MASTECTOMY Left 05/17/2015   Procedure: LEFT TOTAL MASTECTOMY;  Surgeon: Donnice Bury, MD;  Location: [facility] OR;  Service: General;  Laterality: Left;   TUMOR EXCISION  1981   removal of Hodgkins lymphoma     Home Medications:  Prior to Admission medications   Medication Sig Start Date End Date Taking? Authorizing Provider  ALPRAZolam  (XANAX ) 0.5 MG tablet Take 1 tablet (0.5 mg total) by mouth 2 (two) times daily as needed for anxiety. 01/15/18  Yes Rolinda Millman, MD  APIDRA  100 UNIT/ML injection Inject 40 Units into the skin as directed. INFUSE THROUGH INSULIN  PUMP UTD, Bolus depends on carb intake 02/04/15  Yes [provider]  apixaban  (ELIQUIS ) 5 MG TABS tablet Take 1 tablet (5 mg total) by mouth 2 (two) times daily. 07/12/23  Yes Rolan Ezra RAMAN, MD  aspirin  EC 81 MG tablet Take 81 mg by mouth at bedtime.   Yes [provider]  Coenzyme Q10 (CO Q 10) 100 MG CAPS Take 100 mg by mouth at bedtime.    Yes [provider]  colchicine  0.6 MG tablet Take 0.6 mg by mouth daily as needed (gout flare).   Yes [provider]  denosumab  (PROLIA ) 60 MG/ML SOSY injection Inject 60 mg into the skin every 6 (six) months.   Yes [provider]  escitalopram  (LEXAPRO ) 5 MG tablet Take 5 mg by mouth at bedtime.   Yes [provider]  famotidine  (PEPCID ) 40 MG tablet Take 40 mg by mouth at bedtime.   Yes [provider]  gabapentin  (NEURONTIN ) 100 MG capsule Take 100 mg by mouth daily.   Yes [provider]  glucagon  (GLUCAGON  EMERGENCY) 1 MG injection Inject 1 mg into the vein once as needed (low blood sugar).   Yes [provider]  isosorbide  mononitrate (IMDUR ) 120 MG 24 hr tablet Take 1 tablet (120 mg total) by mouth  every morning. 05/08/23  Yes Rolan Ezra RAMAN, MD  latanoprost  (XALATAN ) 0.005 % ophthalmic solution Place 1 drop into both eyes at bedtime.   Yes [provider]  metoprolol  succinate (TOPROL  XL) 25 MG 24 hr tablet Take 0.5 tablets (12.5 mg total) by mouth daily. 05/22/23  Yes Rolan Ezra RAMAN, MD  midodrine  (PROAMATINE ) 5 MG tablet Take 1 tablet (5 mg total) by mouth daily as needed (Prior to HD). 05/08/23  Yes Rolan Ezra RAMAN, MD  mometasone  (NASONEX ) 50 MCG/ACT nasal spray Place 1 spray into the nose daily as needed (allergies). 03/10/20  Yes [provider]  nitroGLYCERIN  (NITROLINGUAL ) 0.4 MG/SPRAY spray PLACE 1 SPRAY UNDER TH TONGUE EVERY 5 MINUTES FOR 3 DOSES AS NEEDED FOR CHEST PAIN 12/25/22  Yes Rolan Ezra RAMAN, MD  polyethylene glycol (MIRALAX  / GLYCOLAX ) 17 g packet Take 17 g by mouth in the morning. Hold for loose stools   Yes [provider]  potassium chloride  SA (KLOR-CON  M) 20 MEQ tablet Take 1 tablet (20 mEq total) by mouth 2 (two) times daily. 07/18/23  Yes Raford Lenis, MD  REPATHA  SURECLICK 140 MG/ML SOAJ INJECT 140MG  INTO THE SKIN EVERY 14 DAYS 03/29/23  Yes Rolan Ezra RAMAN, MD  ULORIC  40 MG tablet Take 1 tablet (40 mg total) by mouth at bedtime. 11/29/17  Yes Rolinda Millman, MD  VASCEPA  1 g capsule TAKE TWO CAPSULES (2 GRAMS TOTAL) BY MOUTH TWO TIMES DAILY 02/26/23  Yes McLean, [name] S, MD    Inpatient Medications: Scheduled Meds:  apixaban   5 mg Oral BID   aspirin  EC  81 mg Oral Daily   Chlorhexidine  Gluconate Cloth  6 each Topical Q0600   escitalopram   5 mg Oral QHS   famotidine   40 mg Oral QHS   fluticasone   2 spray Each Nare Daily   insulin  aspart  0-9 Units Subcutaneous TID WC   insulin  glargine-yfgn  38 Units Subcutaneous QHS   lactulose   20 g Oral BID   [START ON 07/27/2023] levofloxacin   500 mg Oral Q48H   metoprolol  succinate  12.5 mg Oral Daily   midodrine   5 mg Oral TID WC   midodrine   5 mg Oral Once per day on Sunday Tuesday Thursday  Saturday   pravastatin   10 mg Oral Daily   sodium chloride  flush  3 mL Intravenous Q12H   Continuous Infusions:  PRN Meds: acetaminophen , ALPRAZolam , levalbuterol , ondansetron  (ZOFRAN ) IV  Allergies:    Allergies  Allergen Reactions   Avandia [Rosiglitazone Maleate] Other (See Comments)    Congestive heart failure   Cephalexin Anaphylaxis, Swelling and Rash    Extremities swell , throat swelling    Clindamycin Anaphylaxis and Shortness Of Breath    Other reaction(s): esophageal spasms   Humalog [Insulin  Lispro] Itching     big knots and whelp    Lincomycin Anaphylaxis and Shortness Of Breath   Metformin Other (See Comments)    Congestive heart failure   Novolog  [Insulin  Aspart (Human Analog)] Hives and Itching    Humalog & Novolog  big knots and whelp     Penicillins Anaphylaxis, Hives, Swelling and Rash    Did it involve swelling of the face/tongue/throat, SOB, or low BP? Yes Did it involve sudden or severe rash/hives, skin peeling, or any reaction on the inside of your mouth or nose? No Did you need to seek medical attention at a hospital or doctor's office? Yes When did it last happen?      Childhood allergy  If all above answers are "NO", may proceed with cephalosporin use.    Tizanidine Other (See Comments)    Dizziness, Mental Status Changes, Hallucination     Adhesive [Tape] Other (See Comments)    Tears skin, Please use paper tape   Atorvastatin Other (See Comments)    increased LFT's, no muscle weakness, leg pain Muscle and joint pain increased LFT's  Increased LFTs   Cholestyramine Other (See Comments)    Liver Disorder Elevated LFTs     Gentamycin [Gentamicin ] Hives, Itching and Rash    Fine red bumps.  Reaction noted post loop recorder implant, where gentamycin was used for irrigation. Topical prep   Iodinated Contrast Media Other (See Comments)    PAIN DURING LYMPHANGIOGRAM '81, NO ALLERGY TO IV CONTRAST.  Has had procedures with Iodine since  without problems.    Limonene Hives, Itching, Rash and Other (See Comments)    Reaction noted post loop recorder implant, where gentamycin was used for irrigation. Topical prep   Prednisone  Itching and Other (See Comments)    B/P went high, itching all over.  Tolerates with benadryl     Ranexa  [Ranolazine ] Other (See Comments)    Nausea, dizziness, low blood sugar, tingling in right hand, blood blisters on tongue    Rosuvastatin Nausea And Vomiting, Nausea Only and Other (See Comments)    Muscle and joint pain & increased LFTs; tolerates Pravastatin  10mg  (max) Other reaction(s): elevated LFTs   Simvastatin  Other (See Comments)    Increased LFT's    Zinc      Other Reaction(s): Unknown   Acetaminophen      Pt told not to take tylenol  due to liver issues   Azelastine  Other (See Comments)    Other reaction(s): metallic taste,spitting up blood   Clopidogrel  Other (See Comments)    Does not work for patient Medicine was not effective Other reaction(s): Unknown   Kerendia  [Finerenone ] Hives and Nausea Only   Lisinopril  Diarrhea, Nausea Only and Other (See Comments)   Neosporin [Bacitracin -Polymyxin B ] Dermatitis   Codeine Nausea And Vomiting   Erythromycin  Nausea And Vomiting  Hydrocodone -Acetaminophen  Itching, Rash and Other (See Comments)    itching   Latex Itching and Other (See Comments)    Iif wear gloves hands get red ,itchy no prioblem if other wear and touch me   Neomycin Rash and Other (See Comments)    Redness   Nickel Itching   Ranitidine Nausea Only   Tamiflu [Oseltamivir] Nausea And Vomiting   Tramadol Nausea Only    Social History:   Social History   Socioeconomic History   Marital status: Married    Spouse name: Not on file   Number of children: 2   Years of education: Not on file   Highest education level: Doctorate  Occupational History   Occupation: retired    Comment: elementary principal  Tobacco Use   Smoking status: Never   Smokeless tobacco:  Never  Vaping Use   Vaping status: Never Used  Substance and Sexual Activity   Alcohol use: No    Comment: social, 56s   Drug use: Never   Sexual activity: Yes  Other Topics Concern   Not on file  Social History Narrative   Retired Programmer, Multimedia principal, married    Started disability September 2013      Has one daughter in [place], works at [employer]. Has a son, Redell, who is press photographer.   Eats all food groups.   Wear seatbelt.    Attends church. [place], [place]. [place].    Social Drivers of Corporate Investment Banker Strain: Low Risk  (01/02/2023)   Received from [facility], [facility]   Overall Financial Resource Strain (CARDIA)    Difficulty of Paying Living Expenses: Not hard at all  Food Insecurity: No Food Insecurity (01/02/2023)   Received from [facility], [facility]   Hunger Vital Sign    Worried About Running Out of Food in the Last Year: Never true    Ran Out of Food in the Last Year: Never true  Transportation Needs: No Transportation Needs (01/02/2023)   Received from [facility], [facility]   [facility] - Transportation    In the past 12 months, has lack of transportation kept you from medical appointments or from getting medications?: No    Lack of Transportation (Non-Medical): No  Physical Activity: Insufficiently Active (03/17/2021)   Received from [facility]   Exercise Vital Sign  Stress: Stress Concern Present (03/17/2021)   Received from [facility], [facility]   [employer] of Occupational Health - Occupational Stress Questionnaire    Feeling of Stress : To some extent  Social Connections: Not on file  Intimate Partner Violence: Not on file    Family History:    Family History  Problem Relation Age of Onset   Heart attack Mother        d.61   Heart disease Mother    Anal  fissures Mother    Heart attack Father        d.60   Diabetes Father    Heart disease Father    Lung cancer Sister        d.61 metastases to brain. History of smoking. Maternal half-sister.   Cancer Sister 30       unspecified gynecologic cancer (either cervical or ovarian) in her 30s.   Breast cancer Sister 36       d.42 from metastatic disease. Paternal half-sister.   Hypertension Brother  Glaucoma Brother    Parkinson's disease Paternal Aunt    Heart attack Maternal Grandmother    Cancer Maternal Grandfather 50       d.52s oral cancer   Colon cancer Neg Hx    Stomach cancer Neg Hx    Parkinsonism Neg Hx      ROS:  Please see the history of present illness.   All other ROS reviewed and negative.     Physical Exam/Data:   Vitals:   07/25/23 1030 07/25/23 1045 07/25/23 1100 07/25/23 1115  BP:  (!) 108/19 (!) 106/44 (!) 112/50  Pulse: 89 89 86 85  Resp: (!) 26 (!) 27 (!) 22 (!) 22  Temp:      TempSrc:      SpO2: 97% 99% 98% 100%  Height:       No intake or output data in the 24 hours ending 07/25/23 1145    07/18/2023    4:15 AM 07/12/2023    9:17 AM 06/10/2023    9:20 AM  Last 3 Weights  Weight (lbs) 114 lb 116 lb 12.8 oz 116 lb 6.4 oz  Weight (kg) 51.71 kg 52.98 kg 52.799 kg     Body mass index is 20.85 kg/m.  General: ill- appearing female, currently in no acute distress HEENT: normal Neck: no JVD Vascular: No carotid bruits; Distal pulses 2+ bilaterally Cardiac:  normal S1, S2; RRR; 3/6 SEM.  Lungs:  clear to auscultation bilaterally, no wheezing, rhonchi or rales  Abd: soft, nontender, no hepatomegaly  Ext: no pitting edema Musculoskeletal:  No deformities, BUE and BLE strength normal and equal Skin: warm and dry  Neuro:  CNs 2-12 intact, no focal abnormalities noted Psych:  Normal affect   EKG:  The EKG was personally reviewed and demonstrates: Sinus tachycardia heart rate 103 with LVH and associated repolarization abnormalities. Telemetry:   Telemetry was personally reviewed and demonstrates: Atrial fibrillation with RVR this morning with heart rate in the 120's to 130's. Converted back to normal sinus rhythm around 0700 and has been normal sinus rhythm since with heart rate in the 70's to 80's.  Relevant CV Studies:  Echocardiogram: 06/2023 IMPRESSIONS     1. Left ventricular ejection fraction, by estimation, is 60 to 65%. The  left ventricle has normal function. The left ventricle has no regional  wall motion abnormalities. Left ventricular diastolic parameters are  indeterminate.   2. Right ventricular systolic function is normal. The right ventricular  size is normal. There is severely elevated pulmonary artery systolic  pressure.   3. Left atrial size was moderately dilated.   4. Severe MAC mean gradient 13 peak 29 mmHg at HR of 92 bpm. The mitral  valve is abnormal. Mild to moderate mitral valve regurgitation. Moderate  mitral stenosis. Severe mitral annular calcification.   5. Cannot tell if PPM wire contributes to TR Device leads in RA/RV . The  tricuspid valve is abnormal. Tricuspid valve regurgitation is severe.   6. 19 mm bioprosthetic AVR Calcified leaflets gradients have increased  since prior TTE no AR/PVL . The aortic valve has been repaired/replaced.  Aortic valve regurgitation is not visualized. No aortic stenosis is  present.   7. The inferior vena cava is normal in size with greater than 50%  respiratory variability, suggesting right atrial pressure of 3 mmHg.    Laboratory Data:  High Sensitivity Troponin:   Recent Labs  Lab 07/18/23 0437 07/18/23 0620  TROPONINIHS 18* 24*     Chemistry Recent Labs  Lab 07/24/23 2253  NA 131*  K 4.0  CL 94*  CO2 26  GLUCOSE 133*  BUN 35*  CREATININE 3.58*  CALCIUM  10.0  GFRNONAA 13*  ANIONGAP 11    No results for input(s): PROT, ALBUMIN , AST, ALT, ALKPHOS, BILITOT in the last 168 hours. Lipids No results for input(s): CHOL, TRIG,  HDL, LABVLDL, LDLCALC, CHOLHDL in the last 168 hours.  Hematology Recent Labs  Lab 07/24/23 2253  WBC 8.5  RBC 3.69*  HGB 11.5*  HCT 36.2  MCV 98.1  MCH 31.2  MCHC 31.8  RDW 15.6*  PLT 90*   Thyroid   Recent Labs  Lab 07/25/23 0036  TSH 1.851    BNPNo results for input(s): BNP, PROBNP in the last 168 hours.  DDimer No results for input(s): DDIMER in the last 168 hours.   Radiology/Studies:  CT HEAD WO CONTRAST ( ) Result Date: 07/25/2023 CLINICAL DATA:  AMS, dysarthria. Hx fall on AC. r/o ICH EXAM: CT HEAD WITHOUT CONTRAST TECHNIQUE: Contiguous axial images were obtained from the base of the skull through the vertex without intravenous contrast. RADIATION DOSE REDUCTION: This exam was performed according to the departmental dose-optimization program which includes automated exposure control, adjustment of the mA and/or kV according to patient size and/or use of iterative reconstruction technique. COMPARISON:  CT head November 15, 2016. FINDINGS: Brain: No evidence of acute infarction, hemorrhage, hydrocephalus, extra-axial collection or mass lesion/mass effect. Vascular: No hyperdense vessel identified. Skull: No acute fracture. Sinuses/Orbits: Clear sinuses.  No acute orbital findings. Other: No mastoid effusions. IMPRESSION: No evidence of acute intracranial abnormality. Electronically Signed   By: Gilmore GORMAN Molt M.D.   On: 07/25/2023 03:43   DG Chest Portable 1 View Result Date: 07/24/2023 CLINICAL DATA:  new cough EXAM: PORTABLE CHEST 1 VIEW COMPARISON:  Chest x-ray 05/01/2023 FINDINGS: Left chest wall dual lead pacemaker. Right chest wall dialysis catheter with tip overlying the right atrium. The heart and mediastinal contours are unchanged. Atherosclerotic plaque. Coronary artery stents. Interval development of likely loculated moderate volume right pleural effusion. Diffuse hazy airspace opacity of the right lung. No pulmonary edema. No left pleural effusion. Left  lung grossly unremarkable. No pneumothorax. No acute osseous abnormality. IMPRESSION: 1. Interval development of likely loculated moderate volume right pleural effusion. Diffuse hazy airspace opacity of the right lung may represent infection/inflammation versus atelectasis. 2.  Aortic Atherosclerosis (ICD10-I70.0). Electronically Signed   By: Morgane  Naveau M.D.   On: 07/24/2023 22:30   VAS US  DUPLEX DIALYSIS ACCESS (AVF, AVG) Result Date: 07/23/2023 DIALYSIS ACCESS Patient Name:  [name] [name]  Date of Exam:   07/22/2023 Medical Rec #: [id number]          Accession #:    [id number] Date of Birth: [birth year]          Patient Gender: F Patient Age:   70 years Exam Location:  [facility] Procedure:      VAS US  DUPLEX DIALYSIS ACCESS (AVF, AVG) Referring Phys: GAILE NEW --------------------------------------------------------------------------------  Reason for Exam: Routine follow up. Access Site: Right Upper Extremity. Access Type: Brachial-Basilic. History: CKD, HCC. Performing Technologist: Camellia Bolder  Examination Guidelines: A complete evaluation includes B-mode imaging, spectral Doppler, color Doppler, and power Doppler as needed of all accessible portions of each vessel. Unilateral testing is considered an integral part of a complete examination. Limited examinations for reoccurring indications may be performed as noted.  Findings: Right Upper AVF +--------------------+----------+-----------------+--------+ AVF  PSV (cm/s)Flow Vol (mL/min)Comments +--------------------+----------+-----------------+--------+ Native artery inflow   255           347                +--------------------+----------+-----------------+--------+ AVF Anastomosis        493                     0.19 cm  +--------------------+----------+-----------------+--------+  +------------+----------+-------------+----------+--------+ OUTFLOW VEINPSV (cm/s)Diameter (cm)Depth  (cm)Describe +------------+----------+-------------+----------+--------+ Shoulder        81        0.70        0.83            +------------+----------+-------------+----------+--------+ Prox UA         79        0.66        0.94            +------------+----------+-------------+----------+--------+ Mid UA          83        0.67        0.77            +------------+----------+-------------+----------+--------+ Dist UA        446        0.71        0.61            +------------+----------+-------------+----------+--------+   Summary: - Patent right upper extremity arteriovenous fistula. - Velocities less than 100 cm/s noted - Volume Flow: 347 mL/min.  *See table(s) above for measurements and observations.  Diagnosing physician: Norman Serve Electronically signed by Norman Serve on 07/23/2023 at 8:39:55 AM.   --------------------------------------------------------------------------------   Final    Assessment and Plan:   Patient's consult was canceled after I evaluated the patient. She has a very complex medical history as outlined above and the initial consult was for atrial fibrillation with RVR but she did convert back to normal sinus rhythm this morning with no recurrence thus far. She has been restarted on Toprol -XL 12.5 mg daily. Can consider scheduled divided dosing of IV Lopressor  if N/V persist and unable to take PO medications. Please contact Cardiology if we can be of further assistance this admission.   Signed, Laymon CHRISTELLA Qua, PA-C  07/25/2023 11:45 AM

## 2023-07-25 NOTE — Progress Notes (Signed)
   07/25/23 1203  TOC Brief Assessment  Insurance and Status Reviewed  Patient has primary care physician Yes  Home environment has been reviewed Private Home with Spouse  Prior level of function: Independent  Prior/Current Home Services No current home services  Social Drivers of Health Review SDOH reviewed no interventions necessary  Transition of care needs no transition of care needs at this time

## 2023-07-25 NOTE — ED Notes (Signed)
 Gave pt water per family request

## 2023-07-25 NOTE — ED Notes (Signed)
 Pt threw up again

## 2023-07-25 NOTE — Consult Note (Signed)
 ESRD Consult Note  Requesting provider: Afton Louder Service requesting consult: Hospitalist Reason for consult: ESRD, provision of dialysis Indication for acute dialysis?: End Stage Renal Disease  Outpatient dialysis unit: Home HD (in training) Outpatient dialysis prescription: Home, MTTF, EDW 52kg, 3hrs, 2k, currently on IV iron   Assessment/Recommendations: [name] [name] is a/an 73 y.o. female with a past medical history notable for ESRD on HD admitted with influenza and cap  # ESRD: plan for dialysis today or tomorrow.  Maintain likely on MWF schedule while here.  # Volume/ hypertension: EDW 52 kg.  Possibly some degree of volume overload on exam.  Continue midodrine  as scheduled  # Anemia of Chronic Kidney Disease: Hemoglobin 11.5.  Not currently on ESA.  Hold IV iron  in the setting of possible infection.   # Secondary Hyperparathyroidism/Hyperphosphatemia: calcium  acceptable.  Obtain phosphorus level  # Vascular access: TDC with new issues  # hyponatremia: Secondary to ESRD.  Plan for dialysis as above  # influenza pneumonia/loculated right pleural effusion: treatment per primary team.  Transferring to [facility] for possible management of effusion  #acute Irox respiratory failure: Associated with influenza  # DM 1: Management per primary team  # AMS: Likely encephalopathy associated with acute illness.  Management per primary team  # atrial fibrillation with RVR: Management per primary team  # Additional recommendations: - Dose all meds for creatinine clearance < 10 ml/min  - Unless absolutely necessary, no MRIs with gadolinium.  - Implement save arm precautions.  Prefer needle sticks in the dorsum of the hands or wrists.  No blood pressure measurements in arm. - If blood transfusion is requested during hemodialysis sessions, please alert us  prior to the session.  - Use synthetic opioids (Fentanyl /Dilaudid ) if needed  Recommendations were discussed with the primary  team.   History of Present Illness: [name] [name] is a/an 73 y.o. female with a past medical history of ESRD who presents with weakness  Patient had some confusion so history was largely obtained per chart review and from the daughter.  Patient has a history of DM 1, CAD status post CABG and multiple PCI, heart failure, atrial fibrillation, possible cirrhosis, history of nephrectomy, multiple abdominal surgeries who presented with weakness.  Patient presented to the emergency department on the 26 with near syncope at home.  She was felt to have vertigo at that time however, after getting back home she had another episode of near syncope but was caught by her husband.  The daughter notes that the patient has continued to feel unwell and is having more confusion.  Patient just feels overall weak with inability to perform her typical daily tasks.  She has not had any focal weakness.  Denies chest pain.  Possibly some mild shortness of breath and nausea.  Also has had some vomiting and diarrhea.  Ongoing cough for several weeks but may be worse recently.  In the emergency department she was found to have a loculated right pleural effusion, acute hypoxic breast for failure, influenza, concern for sepsis with plans for admission.  Also in A. Fib with RVR.  She has been getting dialysis at the TCU.  Just started home training with plans to follow with Dr. Gearline.   Medications:  Current Facility-Administered Medications  Medication Dose Route Frequency Provider Last Rate Last Admin   acetaminophen  (TYLENOL ) tablet 500 mg  500 mg Oral Q6H PRN Segars, Dorn, MD       ALPRAZolam  (XANAX ) tablet 0.5 mg  0.5 mg Oral QHS PRN  Segars, Jonathan, MD       apixaban  (ELIQUIS ) tablet 5 mg  5 mg Oral BID Segars, Jonathan, MD   5 mg at 07/25/23 9056   aspirin  EC tablet 81 mg  81 mg Oral Daily Segars, Jonathan, MD   81 mg at 07/25/23 9066   escitalopram  (LEXAPRO ) tablet 5 mg  5 mg Oral QHS Segars, Dorn, MD        famotidine  (PEPCID ) tablet 40 mg  40 mg Oral QHS Segars, Dorn, MD       fluticasone  (FLONASE ) 50 MCG/ACT nasal spray 2 spray  2 spray Each Nare Daily Segars, Dorn, MD       insulin  aspart (novoLOG ) injection 0-9 Units  0-9 Units Subcutaneous TID WC Johnson, Clanford L, MD       insulin  glargine-yfgn (SEMGLEE ) injection 38 Units  38 Units Subcutaneous QHS Segars, Jonathan, MD   38 Units at 07/25/23 0449   lactulose  (CHRONULAC ) 10 GM/15ML solution 20 g  20 g Oral BID Segars, Jonathan, MD   20 g at 07/25/23 9356   levalbuterol  (XOPENEX  HFA) inhaler 2 puff  2 puff Inhalation Q6H PRN Segars, Jonathan, MD       [name] 07/27/2023] levofloxacin  (LEVAQUIN ) tablet 500 mg  500 mg Oral Q48H Segars, Dorn, MD       metoprolol  succinate (TOPROL -XL) 24 hr tablet 12.5 mg  12.5 mg Oral Daily Segars, Jonathan, MD   12.5 mg at 07/25/23 0449   midodrine  (PROAMATINE ) tablet 5 mg  5 mg Oral TID WC Segars, Jonathan, MD   5 mg at 07/25/23 0815   midodrine  (PROAMATINE ) tablet 5 mg  5 mg Oral Once per day on Sunday Tuesday Thursday Saturday Keturah Dorn, MD   5 mg at 07/25/23 9065   ondansetron  (ZOFRAN ) injection 4 mg  4 mg Intravenous Q6H PRN Segars, Jonathan, MD   4 mg at 07/25/23 1005   pravastatin  (PRAVACHOL ) tablet 10 mg  10 mg Oral Daily Segars, Dorn, MD       sodium chloride  flush (NS) 0.9 % injection 3 mL  3 mL Intravenous Q12H Segars, Jonathan, MD       Current Outpatient Medications  Medication Sig Dispense Refill   acyclovir  ointment (ZOVIRAX ) 5 % Apply 1 application topically every 3 (three) hours as needed (fever blister/cold sores).     ALPRAZolam  (XANAX ) 0.5 MG tablet Take 1 tablet (0.5 mg total) by mouth 2 (two) times daily as needed for anxiety. 60 tablet 1   APIDRA  100 UNIT/ML injection Inject 40 Units into the skin as directed. INFUSE THROUGH INSULIN  PUMP UTD, Bolus depends on carb intake  2   apixaban  (ELIQUIS ) 5 MG TABS tablet Take 1 tablet (5 mg total) by mouth 2 (two) times  daily. 60 tablet 11   aspirin  EC 81 MG tablet Take 81 mg by mouth at bedtime.     cetirizine (ZYRTEC) 10 MG tablet Take 10 mg by mouth in the morning.     clobetasol  (OLUX ) 0.05 % topical foam Apply 1 application  topically 2 (two) times daily as needed (skin flaking (legs)).     Coenzyme Q10 (CO Q 10) 100 MG CAPS Take 100 mg by mouth at bedtime.      colchicine  0.6 MG tablet Take 0.6 mg by mouth daily as needed (gout flare).     denosumab  (PROLIA ) 60 MG/ML SOSY injection Inject 60 mg into the skin every 6 (six) months.     diclofenac  Sodium (VOLTAREN ) 1 % GEL Apply 2  g topically 4 (four) times daily as needed (pain.).     escitalopram  (LEXAPRO ) 5 MG tablet Take 5 mg by mouth at bedtime.     famotidine  (PEPCID ) 40 MG tablet Take 40 mg by mouth at bedtime.     gabapentin  (NEURONTIN ) 100 MG capsule Take 100 mg by mouth daily.     glucagon  (GLUCAGON  EMERGENCY) 1 MG injection Inject 1 mg into the vein once as needed (low blood sugar).     isosorbide  mononitrate (IMDUR ) 120 MG 24 hr tablet Take 1 tablet (120 mg total) by mouth every morning.     latanoprost  (XALATAN ) 0.005 % ophthalmic solution Place 1 drop into both eyes at bedtime.     meclizine  (ANTIVERT ) 25 MG tablet Take 1 tablet (25 mg total) by mouth 2 (two) times daily as needed for dizziness. 20 tablet 0   metolazone  (ZAROXOLYN ) 2.5 MG tablet Take 2.5 mg (1 tab) once as directed by CHF clinic. Call before taking [phone] 3 tablet 0   metoprolol  succinate (TOPROL  XL) 25 MG 24 hr tablet Take 0.5 tablets (12.5 mg total) by mouth daily. 45 tablet 3   midodrine  (PROAMATINE ) 5 MG tablet Take 1 tablet (5 mg total) by mouth daily as needed (Prior to HD). 30 tablet 3   mometasone  (NASONEX ) 50 MCG/ACT nasal spray Place 1 spray into the nose daily as needed (allergies).     mupirocin  cream (BACTROBAN ) 2 % Apply 1 application topically 2 (two) times daily as needed. 90 g 0   nitroGLYCERIN  (NITROLINGUAL ) 0.4 MG/SPRAY spray PLACE 1 SPRAY UNDER TH  TONGUE EVERY 5 MINUTES FOR 3 DOSES AS NEEDED FOR CHEST PAIN 4.9 g 5   polyethylene glycol (MIRALAX  / GLYCOLAX ) 17 g packet Take 17 g by mouth in the morning. Hold for loose stools     potassium chloride  SA (KLOR-CON  M) 20 MEQ tablet Take 1 tablet (20 mEq total) by mouth 2 (two) times daily. 6 tablet 0   pravastatin  (PRAVACHOL ) 10 MG tablet TAKE ONE TABLET (10 MG TOTAL) BY MOUTH DAILY. 90 tablet 3   Probiotic Product (ALIGN PO) Take 1 tablet by mouth daily in the afternoon.     REPATHA  SURECLICK 140 MG/ML SOAJ INJECT 140MG  INTO THE SKIN EVERY 14 DAYS 6 mL 3   torsemide  (DEMADEX ) 20 MG tablet Take 40 mg Twice daily on Wed,sat and sun 64 tablet 3   ULORIC  40 MG tablet Take 1 tablet (40 mg total) by mouth at bedtime. 90 tablet 3   VASCEPA  1 g capsule TAKE TWO CAPSULES (2 GRAMS TOTAL) BY MOUTH TWO TIMES DAILY 360 capsule 3     ALLERGIES Avandia [rosiglitazone maleate], Cephalexin, Clindamycin, Humalog [insulin  lispro], Lincomycin, Metformin, Novolog  [insulin  aspart (human analog)], Penicillins, Tizanidine, Adhesive [tape], Atorvastatin, Cholestyramine, Gentamycin [gentamicin ], Iodinated contrast media, Limonene, Prednisone , Ranexa  [ranolazine ], Rosuvastatin, Simvastatin , Zinc , Acetaminophen , Azelastine , Clopidogrel , Kerendia  [finerenone ], Lisinopril , Neosporin [bacitracin -polymyxin b ], Codeine, Erythromycin , Hydrocodone -acetaminophen , Latex, Neomycin, Nickel, Ranitidine, Tamiflu [oseltamivir], and Tramadol  MEDICAL HISTORY Past Medical History:  Diagnosis Date   Anxiety    Aortic stenosis    a. bicuspid aortic valve; mean gradient of 20 mmHg in 2/10; b. s/p bioprosth AVR (19-mm Edwards pericardial valve) with a redo coronary artery bypass graft procedure in 07/2009; postoperative Dressler's syndrome     Arthritis    Breast cancer (HCC)    Carcinoma, renal cell (HCC) 05/2007   Laparoscopic right nephrectomy   Chronic diastolic CHF (congestive heart failure) (HCC)    a. 9.2014 EchoP EF 60-65%, no  rwma, bioprosth AVR, mean gradient of , mild to mod MR, PASP .   CKD (chronic kidney disease), stage IV (HCC)    Colitis, ischemic (HCC) 2008   Complication of anesthesia    Coronary artery disease    a. initial CABG with SVG-RCA in 12/05. b. redo SVG-RCA and bioprosthetic AVR in 1/11. d. 9/14 - PCI with DES to distal LM/proximal LAD was done rather than bypass as she would have been a poor candidate for redo sternotomy. d. S/p DES to prox LM 05/2014.   Diabetes mellitus 03/2010    TYPE II: Hemoglobin A1c of 7.4 in  03/2010; 8.4 in 06/2010; treated with insulin  pump   Duodenal ulcer    Remote; H. pylori positive   Dyspnea    Gastroparesis due to DM (HCC) 05/01/2012   Glaucoma 1998   Hodgkin's disease (HCC) 1991   Mantle radiation therapy   Hyperlipidemia    Hyperplastic gastric polyp 06/23/2012   Hypertension    Iron  deficiency anemia    a. 03/2013 EGD: essentially normal. Possible slow GIB.   Migraines    NASH (nonalcoholic steatohepatitis) 1999   -biopsy in 1999   OSA treated with BiPAP    Osteoporosis    Pacemaker    six sinus syndrome   PAF (paroxysmal atrial fibrillation) (HCC)    a.  h/o brief episodes noted on ppm interrogation. b. given GI bleeding and need to be on both ASA 81 and Brilinta , risks likely outweigh benefits of anticoagulation.    PONV (postoperative nausea and vomiting)    Small bowel obstruction (HCC)    Recurrent; resolved after resection of a lipoma   Syncope    a. H/o recurrent syncope with pauses on loop recorder s/p Medtronic pacemaker 07/2012.     SOCIAL HISTORY Social History   Socioeconomic History   Marital status: Married    Spouse name: Not on file   Number of children: 2   Years of education: Not on file   Highest education level: Doctorate  Occupational History   Occupation: retired    Comment: elementary principal  Tobacco Use   Smoking status: Never   Smokeless tobacco: Never  Vaping Use   Vaping status: Never Used   Substance and Sexual Activity   Alcohol use: No    Comment: social, 59s   Drug use: Never   Sexual activity: Yes  Other Topics Concern   Not on file  Social History Narrative   Retired Programmer, Multimedia principal, married    Started disability September 2013      Has one daughter in [place], works at [employer]. Has a son, Redell, who is press photographer.   Eats all food groups.   Wear seatbelt.    Attends church. [place], [place]. [employer].    Social Drivers of Corporate Investment Banker Strain: Low Risk  (01/02/2023)   Received from [facility], [facility]   Overall Financial Resource Strain (CARDIA)    Difficulty of Paying Living Expenses: Not hard at all  Food Insecurity: No Food Insecurity (01/02/2023)   Received from [facility], [facility]   Hunger Vital Sign    Worried About Running Out of Food in the Last Year: Never true    Ran Out of Food in the Last Year: Never true  Transportation Needs: No Transportation Needs (01/02/2023)   Received from [facility], [facility]   [facility] - Transportation  In the past 12 months, has lack of transportation kept you from medical appointments or from getting medications?: No    Lack of Transportation (Non-Medical): No  Physical Activity: Insufficiently Active (03/17/2021)   Received from [facility]   Exercise Vital Sign  Stress: Stress Concern Present (03/17/2021)   Received from [facility], [facility]   [employer] of Occupational Health - Occupational Stress Questionnaire    Feeling of Stress : To some extent  Social Connections: Not on file  Intimate Partner Violence: Not on file     FAMILY HISTORY Family History  Problem Relation Age of Onset   Heart attack Mother        d.61   Heart disease Mother    Anal fissures Mother    Heart attack Father        d.60    Diabetes Father    Heart disease Father    Lung cancer Sister        d.61 metastases to brain. History of smoking. Maternal half-sister.   Cancer Sister 30       unspecified gynecologic cancer (either cervical or ovarian) in her 30s.   Breast cancer Sister 58       d.42 from metastatic disease. Paternal half-sister.   Hypertension Brother    Glaucoma Brother    Parkinson's disease Paternal Aunt    Heart attack Maternal Grandmother    Cancer Maternal Grandfather 50       d.52s oral cancer   Colon cancer Neg Hx    Stomach cancer Neg Hx    Parkinsonism Neg Hx      Review of Systems: 12 systems were reviewed and negative except per HPI  Physical Exam: Vitals:   07/25/23 1000 07/25/23 1015  BP: (!) 108/49   Pulse: 85   Resp: 16   Temp:  98.9 F (37.2 C)  SpO2: 97%    No intake/output data recorded. No intake or output data in the 24 hours ending 07/25/23 1054 General: well-appearing, no acute distress HEENT: anicteric sclera, MMM CV: normal rate, no murmurs, no edema Lungs: bilateral chest rise, normal wob Abd: soft, non-tender, non-distended Skin: no visible lesions or rashes Psych: alert, engaged, appropriate mood and affect Neuro: normal speech, no gross focal deficits   Test Results Reviewed Lab Results  Component Value Date   NA 131 (L) 07/24/2023   K 4.0 07/24/2023   CL 94 (L) 07/24/2023   CO2 26 07/24/2023   BUN 35 (H) 07/24/2023   CREATININE 3.58 (H) 07/24/2023   GFR 70.12 04/13/2015   CALCIUM  10.0 07/24/2023   ALBUMIN  3.4 (L) 07/18/2023    I have reviewed relevant outside healthcare records

## 2023-07-26 ENCOUNTER — Inpatient Hospital Stay (HOSPITAL_COMMUNITY): Payer: Medicare PPO

## 2023-07-26 DIAGNOSIS — J101 Influenza due to other identified influenza virus with other respiratory manifestations: Secondary | ICD-10-CM | POA: Diagnosis not present

## 2023-07-26 DIAGNOSIS — J90 Pleural effusion, not elsewhere classified: Secondary | ICD-10-CM | POA: Diagnosis not present

## 2023-07-26 DIAGNOSIS — R531 Weakness: Secondary | ICD-10-CM | POA: Diagnosis not present

## 2023-07-26 LAB — HEMOGLOBIN A1C
Hgb A1c MFr Bld: 6.7 % — ABNORMAL HIGH (ref 4.8–5.6)
Mean Plasma Glucose: 146 mg/dL

## 2023-07-26 LAB — CBC
HCT: 33.6 % — ABNORMAL LOW (ref 36.0–46.0)
Hemoglobin: 10.8 g/dL — ABNORMAL LOW (ref 12.0–15.0)
MCH: 31.2 pg (ref 26.0–34.0)
MCHC: 32.1 g/dL (ref 30.0–36.0)
MCV: 97.1 fL (ref 80.0–100.0)
Platelets: 77 10*3/uL — ABNORMAL LOW (ref 150–400)
RBC: 3.46 MIL/uL — ABNORMAL LOW (ref 3.87–5.11)
RDW: 15.4 % (ref 11.5–15.5)
WBC: 6.8 10*3/uL (ref 4.0–10.5)
nRBC: 0 % (ref 0.0–0.2)

## 2023-07-26 LAB — GLUCOSE, CAPILLARY
Glucose-Capillary: 80 mg/dL (ref 70–99)
Glucose-Capillary: 148 mg/dL — ABNORMAL HIGH (ref 70–99)
Glucose-Capillary: 92 mg/dL (ref 70–99)

## 2023-07-26 LAB — RENAL FUNCTION PANEL
Albumin: 3.6 g/dL (ref 3.5–5.0)
Anion gap: 14 (ref 5–15)
BUN: 52 mg/dL — ABNORMAL HIGH (ref 8–23)
CO2: 23 mmol/L (ref 22–32)
Calcium: 9.2 mg/dL (ref 8.9–10.3)
Chloride: 92 mmol/L — ABNORMAL LOW (ref 98–111)
Creatinine, Ser: 4.09 mg/dL — ABNORMAL HIGH (ref 0.44–1.00)
GFR, Estimated: 11 mL/min — ABNORMAL LOW (ref >60)
Glucose, Bld: 151 mg/dL — ABNORMAL HIGH (ref 70–99)
Phosphorus: 5.9 mg/dL — ABNORMAL HIGH (ref 2.5–4.6)
Potassium: 4.3 mmol/L (ref 3.5–5.1)
Sodium: 129 mmol/L — ABNORMAL LOW (ref 135–145)

## 2023-07-26 LAB — CBG MONITORING, ED
Glucose-Capillary: 143 mg/dL — ABNORMAL HIGH (ref 70–99)
Glucose-Capillary: 80 mg/dL (ref 70–99)

## 2023-07-26 LAB — HEPATITIS B SURFACE ANTIGEN: Hepatitis B Surface Ag: NONREACTIVE

## 2023-07-26 LAB — BRAIN NATRIURETIC PEPTIDE: B Natriuretic Peptide: 385 pg/mL — ABNORMAL HIGH (ref 0.0–100.0)

## 2023-07-26 MED ORDER — LIDOCAINE HCL (PF) 1 % IJ SOLN: 5.0000 mL | INTRAMUSCULAR | Status: DC | PRN

## 2023-07-26 MED ORDER — PENTAFLUOROPROP-TETRAFLUOROETH EX AERO: 1.0000 | INHALATION_SPRAY | CUTANEOUS | Status: DC | PRN

## 2023-07-26 MED ORDER — LIDOCAINE-PRILOCAINE 2.5-2.5 % EX CREA: 1.0000 | TOPICAL_CREAM | CUTANEOUS | Status: DC | PRN

## 2023-07-26 MED ORDER — LACTULOSE 10 GM/15ML PO SOLN
20.0000 g | Freq: Two times a day (BID) | ORAL | Status: DC
  Administered 2023-07-26 – 2023-08-01 (×11): 20 g via ORAL
  Filled 2023-07-26 (×11): qty 30

## 2023-07-26 MED ORDER — ASPIRIN 81 MG PO TBEC
81.0000 mg | DELAYED_RELEASE_TABLET | Freq: Every day | ORAL | Status: DC
  Administered 2023-07-26 – 2023-07-30 (×5): 81 mg via ORAL
  Filled 2023-07-26 (×5): qty 1

## 2023-07-26 MED ORDER — INSULIN GLARGINE-YFGN 100 UNIT/ML ~~LOC~~ SOLN
38.0000 [IU] | Freq: Every day | SUBCUTANEOUS | Status: DC
  Filled 2023-07-26: qty 0.38

## 2023-07-26 MED ORDER — FLUTICASONE PROPIONATE 50 MCG/ACT NA SUSP
2.0000 | Freq: Every day | NASAL | Status: DC
  Administered 2023-07-26 – 2023-08-01 (×6): 2 via NASAL
  Filled 2023-07-26: qty 16

## 2023-07-26 MED ORDER — HEPARIN SODIUM (PORCINE) 1000 UNIT/ML IJ SOLN
INTRAMUSCULAR | Status: AC
  Filled 2023-07-26: qty 4

## 2023-07-26 MED ORDER — APIXABAN 5 MG PO TABS
5.0000 mg | ORAL_TABLET | Freq: Two times a day (BID) | ORAL | Status: DC
  Administered 2023-07-26 – 2023-07-30 (×9): 5 mg via ORAL
  Filled 2023-07-26 (×6): qty 1
  Filled 2023-07-26: qty 2
  Filled 2023-07-26 (×2): qty 1

## 2023-07-26 MED ORDER — ORAL CARE MOUTH RINSE: 15.0000 mL | OROMUCOSAL | Status: DC | PRN

## 2023-07-26 MED ORDER — SALINE SPRAY 0.65 % NA SOLN
1.0000 | NASAL | Status: DC | PRN
  Filled 2023-07-26: qty 44

## 2023-07-26 MED ORDER — HEPARIN SODIUM (PORCINE) 1000 UNIT/ML DIALYSIS
1000.0000 [IU] | INTRAMUSCULAR | Status: DC | PRN
  Administered 2023-07-26: 3800 [IU] via INTRAVENOUS_CENTRAL

## 2023-07-26 MED ORDER — ALTEPLASE 2 MG IJ SOLR: 2.0000 mg | Freq: Once | INTRAMUSCULAR | Status: DC | PRN

## 2023-07-26 MED ORDER — METOPROLOL SUCCINATE ER 25 MG PO TB24
12.5000 mg | ORAL_TABLET | Freq: Every day | ORAL | Status: DC
  Administered 2023-07-27 – 2023-08-01 (×5): 12.5 mg via ORAL
  Filled 2023-07-26 (×6): qty 1

## 2023-07-26 MED ORDER — MIDODRINE HCL 5 MG PO TABS
5.0000 mg | ORAL_TABLET | ORAL | Status: DC
  Administered 2023-07-26 – 2023-08-01 (×6): 5 mg via ORAL
  Filled 2023-07-26 (×6): qty 1

## 2023-07-26 NOTE — ED Notes (Signed)
 Pt's daughter ran to nurses' station stating pt was choking. This RN and NT went in to assess. Pt coughing excessively and lying flat on entry into room, HOB elevated and suctioning provided. Pt states she felt mucous running down the back of her throat and it felt like she was choking. Pt's sats down to 86% after coughing spell. O2 bumped to sats improved to 94% on 6LNC. Coughing relieved at this time.

## 2023-07-26 NOTE — Progress Notes (Signed)
 PROGRESS NOTE   [name] [name]  FMW:[id number] DOB: 1950-09-12 DOA: 07/24/2023 PCP: Rolinda Millman, MD   Chief Complaint  Patient presents with   Weakness   Level of care: Progressive  Brief Admission History:   73 y.o. female with hx of ESRD on M/Tu/Th/F HD, type 1 diabetes on insulin  pump, CAD status post CABG multiple stents, diastolic heart failure, A-fib on anticoagulation, question Nash cirrhosis, RCC status post right nephrectomy, multiple abdominal surgeries small bowel resection, remote history of lymphoma, recent ED visit on 12/26 with episode of near syncope felt to have vertigo, returns today due to similar episode of near syncope and fall (although was caught by her husband) accompanied by confusion.  History mainly provided by patient's daughter at the bedside as patient is too confused to provide history.  Daughter notes that events are similar to episode that occurred on 12/26.  On the prior episode symptoms resolved once coming into the emergency department although did have confusion, issues with speech at that time.     This morning when getting up to go to the bathroom patient became weak and husband caught her while falling to the ground.  She did not lose consciousness.  Had generalized weakness following.  Appears to be confused and not at her baseline per family, speech seeming dysarthric.  Did have a fall prior to 12/26.  Denies any headache, focal numbness, weakness, vision changes.  Recently started on anticoagulation for A-fib.    Otherwise daughter notes, over the past week Since being home from the emergency department has done okay although having intermittent episodes of vertigo thinking possibly orthostatic symptoms per daughter, having nausea, vomiting, diarrhea. Husband worried she is dehydrated.  Has also been having on and off cough since November.    Assessment and Plan:  Influenza A pneumonia Loculated right pleural effusion Acute hypoxic respiratory  failure, secondary to above Sepsis likely secondary to viral illness, present on admission.  Reports subacute ongoing on and off cough without significant change, although recently having GI symptoms, decreased p.o. intake.  Febrile 38.5, tachypneic, tachycardic developing A-fib RVR. Desatted to 85% on room air, initially on 2 L although due to desat increased to 6 L O2.  Influenza A positive. Chest x-ray with interval development of right-sided moderate likely loculated effusion and right-sided hazy airspace disease. -Prefer admission to [facility] so that she can have IR services available for thoracentesis -Intolerance of Tamiflu, will hold off -initially started on levofloxacin  750 mg p.o. x 1, then 500 mg every 48 hours (renal dosing) to cover for possible bacterial pneumonia -Status post 500 cc IV fluids in the ED, since lactate is normal hold off on large-volume fluid resuscitation in setting of her ESRD. -Follow-up blood cultures, suspected sepsis from viral cause -Evaluation by IR for thoracentesis once at [facility], note she is currently on anticoagulation.  Continued anticoagulation for now without a definite plan for thoracentesis -Xopenex  INH as needed, incentive spirometry, flutter valve, encourage out of bed to chair.   Encephalopathy, acute - RESOLVED  Possible delirium I/s/o influenza, mild hyperammonemia, or med effect  Noted on admission to have disorientation, some garbled speech/dysarthria, + hallucinations seeing smurfs, although no focal findings.  Suspect delirium in the setting of underlying influenza pneumonia, hypoxia. Additional causes may include decompensated Hollie cirrhosis with mild hepatic encephalopathy in setting of positive asterixis, mild hyperammonemia at 43. Possibly component of medication effects (benzodiazepine, Gabapentin ). Other evaluation unrevealing including chemistries (mild hypo Na at 131 not contributing), TSH  wnl, B12 wnl, VBG no hypercarbia, and CT  head with no acute intracranial abnormalities.  - Management of influenza pneumonia, hypoxemia per above - continue lactulose  twice daily given recent diarrhea, target 3-4 bowel movements daily - If encephalopathy continues could consider rifaximin - Hold gabapentin  for now.  Continue her home Xanax  nightly to avoid withdrawal. - Delirium precautions, fall precautions   A-fib with RVR - RESOLVED  Developed A-fib with RVR into the 130s in the emergency department. - Status post 500 cc IV fluid with minimal improvement. - Give metoprolol  2.5 mg IV x 1, and continue metoprolol  succinate 12.5 mg daily - Given borderline hypotension start midodrine  5 mg p.o. 3 times daily scheduled which may help uptitrating beta-blockers if needed -Continue home Eliquis  5 mg twice daily for full anticoagulation    Type 1 diabetes, encephalopathy limiting ability to use insulin  pump Chronically on insulin  pump. per last endocrinology notes TDD insulin  52 units, bolus 14 units, off pump recommendation for 38 units Lantus  as backup with Apidra  per ICR.  - Discontinued her insulin  pump for now given encephalopathy - Start Semglee  38 units every 24 hours, for correctional may have to consider insulin  regular if not able to resume pump. Hx allergy to humalog/novolog   - If encephalopathy improving can resume her home insulin  pump for bolus for meals - DM educator consult to assist CBG (last 3)  Recent Labs    07/26/23 0840 07/26/23 1313 07/26/23 1428  GLUCAP 143* 80 80    Mild hyponatremia Suspect recently hypovolemic with low solute by history -Trend BMP   ? 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 P/w encephalopathy found to have + asterixis and mild hyperammonemia.  -Start lactulose  twice daily, consider rifaximin if no improvement in encephalopathy. -Follow-up with hepatology outpatient   ESRD on M/Tu/Th/F HD: Needs routine nephrology consult in AM for HD,  not contacted overnight. Started on scheduled Midodrine  per above. Typically takes 10 mg total prior / after HD. For now added additional 5 mg on HD days in addition to scheduled midodrine  (5 mg TID).   CAD history of CABG, multiple PCI / HLD: Continue aspirin  in addition to anticoagulation with Eliquis , pravastatin . Hold home ISMN in setting of borderline hypotension. On Repatha , Vascepa  OP.   HFpEF: Volume management via HD  Mood disorder: Continue home Xanax  0.5 mg nightly, escitalopram    neuropathy: Holding gabapentin  given encephalopathy.  Gastroparesis: Not on medication management   GERD: Continue home Famotidine    History RCC, right nephrectomy: Outpatient surveillance  Remote history lymphoma: outpatient surveillance   DVT prophylaxis: apixaban  Code Status: Full  Family Communication: brother at bedside 1/3 Disposition: awaiting for bed at [facility]    Consultants:  Nephrology  Procedures:  Hemodialysis   Antimicrobials:    Subjective: Pt has vomited a couple of times, had some bouts of SOB overnight but seems to be breathing better today.    Objective: Vitals:   07/26/23 1229 07/26/23 1251 07/26/23 1300 07/26/23 1316  BP: (!) 139/53 (!) 126/53 (!) 140/41   Pulse: 93 93 92   Resp: 19 15 (!) 21   Temp: 97.8 F (36.6 C)   99.1 F (37.3 C)  TempSrc:    Oral  SpO2: 100% 100% 100%   Height:        Intake/Output Summary (Last 24 hours) at 07/26/2023 1433 Last data filed at 07/26/2023 1229 Gross per 24 hour  Intake --  Output 3000 ml  Net -3000 ml  Filed Weights   Examination:  General exam: Appears calm and comfortable  Respiratory system: diminished BS RLL.  Respiratory effort normal. Cardiovascular system: normal S1 & S2 heard. No JVD, murmurs, rubs, gallops or clicks. No pedal edema. Gastrointestinal system: Abdomen is nondistended, soft and nontender. No organomegaly or masses felt. Normal bowel sounds heard. Central nervous system: Alert and oriented. No  focal neurological deficits. Extremities: Symmetric 5 x 5 power. Skin: No rashes, lesions or ulcers. Psychiatry: Judgement and insight appear normal. Mood & affect appropriate.   Data Reviewed: I have personally reviewed following labs and imaging studies  CBC: Recent Labs  Lab 07/24/23 2253 07/26/23 0343  WBC 8.5 6.8  HGB 11.5* 10.8*  HCT 36.2 33.6*  MCV 98.1 97.1  PLT 90* 77*    Basic Metabolic Panel: Recent Labs  Lab 07/24/23 2253 07/26/23 0343  NA 131* 129*  K 4.0 4.3  CL 94* 92*  CO2 26 23  GLUCOSE 133* 151*  BUN 35* 52*  CREATININE 3.58* 4.09*  CALCIUM  10.0 9.2  PHOS  --  5.9*    CBG: Recent Labs  Lab 07/25/23 1644 07/25/23 2124 07/26/23 0840 07/26/23 1313 07/26/23 1428  GLUCAP 111* 168* 143* 80 80    Recent Results (from the past 240 hours)  Resp panel by RT-PCR (RSV, Flu A&B, Covid) Anterior Nasal Swab     Status: Abnormal   Collection Time: 07/24/23  9:55 PM   Specimen: Anterior Nasal Swab  Result Value Ref Range Status   SARS Coronavirus 2 by RT PCR NEGATIVE NEGATIVE Final    Comment: (NOTE) SARS-CoV-2 target nucleic acids are NOT DETECTED.  The SARS-CoV-2 RNA is generally detectable in upper respiratory specimens during the acute phase of infection. The lowest concentration of SARS-CoV-2 viral copies this assay can detect is 138 copies/mL. A negative result does not preclude SARS-Cov-2 infection and should not be used as the sole basis for treatment or other patient management decisions. A negative result may occur with  improper specimen collection/handling, submission of specimen other than nasopharyngeal swab, presence of viral mutation(s) within the areas targeted by this assay, and inadequate number of viral copies(<138 copies/mL). A negative result must be combined with clinical observations, patient history, and epidemiological information. The expected result is Negative.  Fact Sheet for Patients:   bloggercourse.com  Fact Sheet for Healthcare Providers:  seriousbroker.it  This test is no t yet approved or cleared by the [place]  FDA and  has been authorized for detection and/or diagnosis of SARS-CoV-2 by FDA under an Emergency Use Authorization (EUA). This EUA will remain  in effect (meaning this test can be used) for the duration of the COVID-19 declaration under Section 564(b)(1) of the Act, 21 U.S.C.section 360bbb-3(b)(1), unless the authorization is terminated  or revoked sooner.       Influenza A by PCR POSITIVE (A) NEGATIVE Final   Influenza B by PCR NEGATIVE NEGATIVE Final    Comment: (NOTE) The Xpert Xpress SARS-CoV-2/FLU/RSV plus assay is intended as an aid in the diagnosis of influenza from Nasopharyngeal swab specimens and should not be used as a sole basis for treatment. Nasal washings and aspirates are unacceptable for Xpert Xpress SARS-CoV-2/FLU/RSV testing.  Fact Sheet for Patients: bloggercourse.com  Fact Sheet for Healthcare Providers: seriousbroker.it  This test is not yet approved or cleared by the [place]  FDA and has been authorized for detection and/or diagnosis of SARS-CoV-2 by FDA under an Emergency Use Authorization (EUA). This EUA will remain in effect (meaning this  test can be used) for the duration of the COVID-19 declaration under Section 564(b)(1) of the Act, 21 U.S.C. section 360bbb-3(b)(1), unless the authorization is terminated or revoked.     Resp Syncytial Virus by PCR NEGATIVE NEGATIVE Final    Comment: (NOTE) Fact Sheet for Patients: bloggercourse.com  Fact Sheet for Healthcare Providers: seriousbroker.it  This test is not yet approved or cleared by the [place]  FDA and has been authorized for detection and/or diagnosis of SARS-CoV-2 by FDA under an Emergency Use  Authorization (EUA). This EUA will remain in effect (meaning this test can be used) for the duration of the COVID-19 declaration under Section 564(b)(1) of the Act, 21 U.S.C. section 360bbb-3(b)(1), unless the authorization is terminated or revoked.  Performed at [facility], [address], [place], [place] [address]   Blood Culture (routine x 2)     Status: None (Preliminary result)   Collection Time: 07/25/23 12:36 AM   Specimen: BLOOD  Result Value Ref Range Status   Specimen Description BLOOD BLOOD LEFT ARM  Final   Special Requests   Final    BOTTLES DRAWN AEROBIC AND ANAEROBIC Blood Culture adequate volume   Culture   Final    NO GROWTH 1 DAY Performed at [facility], [address] [place], [place] [address]    Report Status PENDING  Incomplete  Blood Culture (routine x 2)     Status: None (Preliminary result)   Collection Time: 07/25/23 12:36 AM   Specimen: BLOOD  Result Value Ref Range Status   Specimen Description BLOOD BLOOD LEFT HAND  Final   Special Requests   Final    BOTTLES DRAWN AEROBIC AND ANAEROBIC Blood Culture adequate volume   Culture   Final    NO GROWTH 1 DAY Performed at [facility], [address] [place], [place] [address]    Report Status PENDING  Incomplete     Radiology Studies: DG CHEST PORT 1 VIEW Result Date: 07/26/2023 CLINICAL DATA:  73 year old female with history of hypoxia and hemoptysis. EXAM: PORTABLE CHEST 1 VIEW COMPARISON:  Chest x-ray 07/24/2023. FINDINGS: Right internal jugular PermCath with tip projecting over the superior aspect of the right atrium. Left-sided pacemaker device in place with lead tips projecting over the right atrium and right ventricular apex. Status post median sternotomy for CABG and aortic valve replacement (a stented bioprosthesis is noted). Lung volumes are low. Irregular opacities throughout the periphery of the right mid to lower hemithorax most compatible with a partially loculated right-sided pleural  effusion, similar to the recent prior study. Ill-defined opacities throughout the right lung, particularly in the right mid to lower lung, which likely reflect areas of underlying atelectasis and/or consolidation. Left lung appears clear. No left pleural effusion. No pneumothorax. No evidence of pulmonary edema. Heart size is upper limits of normal. Atherosclerotic calcifications in the thoracic aorta. IMPRESSION: 1. Moderate partially loculated right-sided pleural effusion with extensive areas of atelectasis and/or consolidation throughout the right mid to lower lung, similar to the recent prior study. 2. Aortic atherosclerosis. 3. Postoperative changes and support apparatus, as above. Electronically Signed   By: Toribio Aye M.D.   On: 07/26/2023 05:36   CT HEAD WO CONTRAST ( ) Result Date: 07/25/2023 CLINICAL DATA:  AMS, dysarthria. Hx fall on AC. r/o ICH EXAM: CT HEAD WITHOUT CONTRAST TECHNIQUE: Contiguous axial images were obtained from the base of the skull through the vertex without intravenous contrast. RADIATION DOSE REDUCTION: This exam was performed according to the departmental dose-optimization program which  includes automated exposure control, adjustment of the mA and/or kV according to patient size and/or use of iterative reconstruction technique. COMPARISON:  CT head November 15, 2016. FINDINGS: Brain: No evidence of acute infarction, hemorrhage, hydrocephalus, extra-axial collection or mass lesion/mass effect. Vascular: No hyperdense vessel identified. Skull: No acute fracture. Sinuses/Orbits: Clear sinuses.  No acute orbital findings. Other: No mastoid effusions. IMPRESSION: No evidence of acute intracranial abnormality. Electronically Signed   By: Gilmore GORMAN Molt M.D.   On: 07/25/2023 03:43   DG Chest Portable 1 View Result Date: 07/24/2023 CLINICAL DATA:  new cough EXAM: PORTABLE CHEST 1 VIEW COMPARISON:  Chest x-ray 05/01/2023 FINDINGS: Left chest wall dual lead pacemaker. Right chest  wall dialysis catheter with tip overlying the right atrium. The heart and mediastinal contours are unchanged. Atherosclerotic plaque. Coronary artery stents. Interval development of likely loculated moderate volume right pleural effusion. Diffuse hazy airspace opacity of the right lung. No pulmonary edema. No left pleural effusion. Left lung grossly unremarkable. No pneumothorax. No acute osseous abnormality. IMPRESSION: 1. Interval development of likely loculated moderate volume right pleural effusion. Diffuse hazy airspace opacity of the right lung may represent infection/inflammation versus atelectasis. 2.  Aortic Atherosclerosis (ICD10-I70.0). Electronically Signed   By: Morgane  Naveau M.D.   On: 07/24/2023 22:30    Scheduled Meds:  apixaban   5 mg Oral BID   aspirin  EC  81 mg Oral Daily   Chlorhexidine  Gluconate Cloth  6 each Topical Q0600   escitalopram   5 mg Oral QHS   famotidine   40 mg Oral QHS   fluticasone   2 spray Each Nare Daily   insulin  glargine-yfgn  38 Units Subcutaneous Q24H   lactulose   20 g Oral BID   [START ON 07/27/2023] levofloxacin   500 mg Oral Q48H   metoprolol  succinate  12.5 mg Oral Daily   midodrine   5 mg Oral Once per day on Sunday Tuesday Thursday Saturday   sodium chloride  flush  3 mL Intravenous Q12H   Continuous Infusions:   LOS: 1 day   Time spent: 50 mins  Patsy Varma Vicci, MD How to contact the [facility] Attending or Consulting provider 7A - 7P or covering provider during after hours 7P -7A, for this patient?  Check the care team in [facility] and look for a) attending/consulting TRH provider listed and b) the TRH team listed Log into [email or web address] to find provider on call.  Locate the TRH provider you are looking for under [facility] and page to a number that you can be directly reached. If you still have difficulty reaching the provider, please page the [facility] (Director on Call) for the Hospitalists listed on amion for assistance.  07/26/2023, 2:33 PM

## 2023-07-26 NOTE — Procedures (Signed)
 I was present at this dialysis session. I have reviewed the session itself and made appropriate changes.   Filed Weights    Recent Labs  Lab 07/26/23 0343  NA 129*  K 4.3  CL 92*  CO2 23  GLUCOSE 151*  BUN 52*  CREATININE 4.09*  CALCIUM  9.2  PHOS 5.9*    Recent Labs  Lab 07/24/23 2253 07/26/23 0343  WBC 8.5 6.8  HGB 11.5* 10.8*  HCT 36.2 33.6*  MCV 98.1 97.1  PLT 90* 77*    Scheduled Meds:  apixaban   5 mg Oral BID   aspirin  EC  81 mg Oral Daily   Chlorhexidine  Gluconate Cloth  6 each Topical Q0600   escitalopram   5 mg Oral QHS   famotidine   40 mg Oral QHS   fluticasone   2 spray Each Nare Daily   insulin  glargine-yfgn  38 Units Subcutaneous Q24H   lactulose   20 g Oral BID   [START ON 07/27/2023] levofloxacin   500 mg Oral Q48H   metoprolol  succinate  12.5 mg Oral Daily   midodrine   5 mg Oral Once per day on Sunday Tuesday Thursday Saturday   sodium chloride  flush  3 mL Intravenous Q12H   Continuous Infusions: PRN Meds:.acetaminophen , ALPRAZolam , alteplase , heparin , levalbuterol , lidocaine  (PF), lidocaine -prilocaine , ondansetron  (ZOFRAN ) IV, pentafluoroprop-tetrafluoroeth   Jayson Player,  MD 07/26/2023, 10:04 AM

## 2023-07-26 NOTE — Procedures (Signed)
 HD Note:  Some information was entered later than the data was gathered due to patient care needs. The stated time with the data is accurate.  Received patient in bed to unit.   Alert and oriented.   Informed consent signed and in chart.   Access used: upper right chest HD catheter Access issues: none  Patient tolerated treatment well.   TX duration: 3 hours  Alert, without acute distress.  Total UF removed: 3000 ml  Hand-off given to patient's nurse.   Transported back to the room   Desjuan Stearns L. Lenon, RN Kidney Dialysis Unit.

## 2023-07-26 NOTE — ED Notes (Signed)
 Pt pulled up in bed  Pt family stated she was having trouble breathing Pt O2 95% at time in room, with no signs of difficulty  Pt states being pulled up Helped and that she was more comfortable now

## 2023-07-26 NOTE — Progress Notes (Signed)
 Patient has Bipap at home that does AVAPs mode.  Family was supposed to bring patient's machine to hospital but patient is currently in room sleeping with 2L [place] with current sat of 99%.  According to RT note from last night, patient is very particular about her mask as well and did not want to use one of our disposable ones.  Did not see home machine in room with patient; will continue to monitor.

## 2023-07-26 NOTE — ED Notes (Addendum)
 Notified Dr. Duard Larsen of emesis episode and increased oxygen needs - no new orders placed, but "continue incentive spirometry, treat her nausea, sit her up" per Dr. Alesia Richards. HOB elevated to high semi-fowlers. Pt declines nausea meds at this time

## 2023-07-26 NOTE — ED Notes (Signed)
 Report given to dialysis RN, treatment session planned for 3hrs per Amy RN. Will come to get pt in 30 min.

## 2023-07-26 NOTE — Progress Notes (Signed)
 Pt arrived to the floor around 1415 via [facility]. Pt alert and oriented x4 in no acute distress. VSS. Respirations even and unlabored on 2 L oxygen via nasal cannula. Pt reports weakness and pain in bilateral hips. Pt oriented to room. Pt instructed on use of call bell and to call for assistance. Pt instructed not to get up unassisted. Bed alarm on. Grip socks placed on pt. Call bell within reach and bed in low position. Pt's brother a bedside.

## 2023-07-26 NOTE — Hospital Course (Signed)
 73 y.o. female with hx of ESRD on M/Tu/Th/F HD, type 1 diabetes on insulin  pump, CAD status post CABG multiple stents, diastolic heart failure, A-fib on anticoagulation, question Nash cirrhosis, RCC status post right nephrectomy, multiple abdominal surgeries small bowel resection, remote history of lymphoma, recent ED visit on 12/26 with episode of near syncope felt to have vertigo, returns today due to similar episode of near syncope and fall (although was caught by her husband) accompanied by confusion.  History mainly provided by patient's daughter at the bedside as patient is too confused to provide history.  Daughter notes that events are similar to episode that occurred on 12/26.  On the prior episode symptoms resolved once coming into the emergency department although did have confusion, issues with speech at that time.     This morning when getting up to go to the bathroom patient became weak and husband caught her while falling to the ground.  She did not lose consciousness.  Had generalized weakness following.  Appears to be confused and not at her baseline per family, speech seeming dysarthric.  Did have a fall prior to 12/26.  Denies any headache, focal numbness, weakness, vision changes.  Recently started on anticoagulation for A-fib.    Otherwise daughter notes, over the past week Since being home from the emergency department has done okay although having intermittent episodes of vertigo thinking possibly orthostatic symptoms per daughter, having nausea, vomiting, diarrhea. Husband worried she is dehydrated.  Has also been having on and off cough since November.

## 2023-07-26 NOTE — Progress Notes (Signed)
 Nephrology Follow-Up Consult note  Outpatient dialysis unit: Home HD (in training) Outpatient dialysis prescription: Home, MTTF, EDW 52kg, 3hrs, 2k, currently [name] IV iron    Assessment/Recommendations: [name] [name] is a/an 73 y.o. female with a past medical history notable for ESRD [name] HD admitted with influenza and cap   # ESRD: Undergoing dialysis today.  Plan to maintain MWF schedule   # Volume/ hypertension: EDW 52 kg.  Achieve ultrafiltration as tolerated.  Midodrine  as scheduled   # Anemia of Chronic Kidney Disease: Hemoglobin 10.8.  Not currently [name] ESA.  Hold IV iron  in the setting of possible infection.    # Secondary Hyperparathyroidism/Hyperphosphatemia: calcium  acceptable.  Phosphorus 5.9.  Continue to monitor   # Vascular access: [facility] with new issues   # hyponatremia: Secondary to ESRD.  Plan for dialysis as above   # influenza pneumonia/loculated right pleural effusion: treatment per primary team.  Transferring to [facility] for possible management of effusion   #acute Irox respiratory failure: Associated with influenza   # DM 1: Management per primary team   # AMS: Likely encephalopathy associated with acute illness.  Management per primary team   # atrial fibrillation with RVR: Management per primary team   # Additional recommendations: - Dose all meds for creatinine clearance < 10 ml/min  - Unless absolutely necessary, no MRIs with gadolinium.  - Implement save arm precautions.  Prefer needle sticks in the dorsum of the hands or wrists.  No blood pressure measurements in arm. - If blood transfusion is requested during hemodialysis sessions, please alert us  prior to the session.  - Use synthetic opioids (Fentanyl /Dilaudid ) if needed   Recommendations were discussed with the primary team.   Recommendations conveyed to primary service.    [name] 07/26/2023 10:05  AM  ___________________________________________________________  CC: weakness  Interval History/Subjective: Patient looks a little bit better to me today.  Continues to be confused.  Unable to tell me exactly what is bothering her.  Was [name] high flow nasal cannula overnight for some time   Medications:  Current Facility-Administered Medications  Medication Dose Route Frequency Provider Last Rate Last Admin   acetaminophen  (TYLENOL ) tablet 500 mg  500 mg Oral Q6H PRN Segars, Jonathan, MD   500 mg at 07/26/23 0035   ALPRAZolam  (XANAX ) tablet 0.5 mg  0.5 mg Oral QHS PRN Segars, Dorn, MD       alteplase  (CATHFLO ACTIVASE ) injection 2 mg  2 mg Intracatheter Once PRN Player [name] JINNY, MD       apixaban  (ELIQUIS ) tablet 5 mg  5 mg Oral BID Segars, Jonathan, MD   5 mg at 07/25/23 2126   aspirin  EC tablet 81 mg  81 mg Oral Daily Segars, Jonathan, MD   81 mg at 07/25/23 9066   Chlorhexidine  Gluconate Cloth 2 % PADS 6 each  6 each Topical Q0600 Player [name] JINNY, MD       escitalopram  (LEXAPRO ) tablet 5 mg  5 mg Oral QHS Segars, Jonathan, MD   5 mg at 07/25/23 2125   famotidine  (PEPCID ) tablet 40 mg  40 mg Oral QHS Segars, Jonathan, MD   40 mg at 07/25/23 2127   fluticasone  (FLONASE ) 50 MCG/ACT nasal spray 2 spray  2 spray Each Nare Daily Segars, Dorn, MD       heparin  injection 1,000 Units  1,000 Units Dialysis PRN Player [name] JINNY, MD       insulin  glargine-yfgn (SEMGLEE ) injection 38 Units  38 Units Subcutaneous  Q24H Segars, Jonathan, MD   38 Units at 07/26/23 9561   lactulose  (CHRONULAC ) 10 GM/15ML solution 20 g  20 g Oral BID Segars, Jonathan, MD   20 g at 07/25/23 2127   levalbuterol  (XOPENEX  HFA) inhaler 2 puff  2 puff Inhalation Q6H PRN Segars, Jonathan, MD       [name] [name] 07/27/2023] levofloxacin  (LEVAQUIN ) tablet 500 mg  500 mg Oral Q48H Segars, Dorn, MD       lidocaine  (PF) (XYLOCAINE ) 1 % injection 5 mL  5 mL Intradermal PRN Macel [name] PARAS, MD       lidocaine -prilocaine   (EMLA ) cream 1 Application  1 Application Topical PRN Jeovani Weisenburger J, MD       metoprolol  succinate (TOPROL -XL) 24 hr tablet 12.5 mg  12.5 mg Oral Daily Segars, Dorn, MD   12.5 mg at 07/25/23 0449   midodrine  (PROAMATINE ) tablet 5 mg  5 mg Oral Once per day [name] Sunday Tuesday Thursday Saturday Vicci Pen L, MD   5 mg at 07/26/23 9273   ondansetron  (ZOFRAN ) injection 4 mg  4 mg Intravenous Q6H PRN Keturah Dorn, MD   4 mg at 07/25/23 1005   pentafluoroprop-tetrafluoroeth (GEBAUERS) aerosol 1 Application  1 Application Topical PRN Leatrice Parilla J, MD       sodium chloride  flush (NS) 0.9 % injection 3 mL  3 mL Intravenous Q12H Segars, Jonathan, MD   3 mL at 07/25/23 2145   Current Outpatient Medications  Medication Sig Dispense Refill   ALPRAZolam  (XANAX ) 0.5 MG tablet Take 1 tablet (0.5 mg total) by mouth 2 (two) times daily as needed for anxiety. 60 tablet 1   APIDRA  100 UNIT/ML injection Inject 40 Units into the skin as directed. INFUSE THROUGH INSULIN  PUMP UTD, Bolus depends [name] carb intake  2   apixaban  (ELIQUIS ) 5 MG TABS tablet Take 1 tablet (5 mg total) by mouth 2 (two) times daily. 60 tablet 11   aspirin  EC 81 MG tablet Take 81 mg by mouth at bedtime.     Coenzyme Q10 (CO Q 10) 100 MG CAPS Take 100 mg by mouth at bedtime.      colchicine  0.6 MG tablet Take 0.6 mg by mouth daily as needed (gout flare).     denosumab  (PROLIA ) 60 MG/ML SOSY injection Inject 60 mg into the skin every 6 (six) months.     escitalopram  (LEXAPRO ) 5 MG tablet Take 5 mg by mouth at bedtime.     famotidine  (PEPCID ) 40 MG tablet Take 40 mg by mouth at bedtime.     gabapentin  (NEURONTIN ) 100 MG capsule Take 100 mg by mouth daily.     glucagon  (GLUCAGON  EMERGENCY) 1 MG injection Inject 1 mg into the vein once as needed (low blood sugar).     isosorbide  mononitrate (IMDUR ) 120 MG 24 hr tablet Take 1 tablet (120 mg total) by mouth every morning.     latanoprost  (XALATAN ) 0.005 % ophthalmic solution Place  1 drop into both eyes at bedtime.     metoprolol  succinate (TOPROL  XL) 25 MG 24 hr tablet Take 0.5 tablets (12.5 mg total) by mouth daily. 45 tablet 3   midodrine  (PROAMATINE ) 5 MG tablet Take 1 tablet (5 mg total) by mouth daily as needed (Prior to HD). 30 tablet 3   mometasone  (NASONEX ) 50 MCG/ACT nasal spray Place 1 spray into the nose daily as needed (allergies).     nitroGLYCERIN  (NITROLINGUAL ) 0.4 MG/SPRAY spray PLACE 1 SPRAY UNDER TH TONGUE EVERY 5 MINUTES FOR 3  DOSES AS NEEDED FOR CHEST PAIN 4.9 g 5   polyethylene glycol (MIRALAX  / GLYCOLAX ) 17 g packet Take 17 g by mouth in the morning. Hold for loose stools     potassium chloride  SA (KLOR-CON  M) 20 MEQ tablet Take 1 tablet (20 mEq total) by mouth 2 (two) times daily. 6 tablet 0   REPATHA  SURECLICK 140 MG/ML SOAJ INJECT 140MG  INTO THE SKIN EVERY 14 DAYS 6 mL 3   ULORIC  40 MG tablet Take 1 tablet (40 mg total) by mouth at bedtime. 90 tablet 3   VASCEPA  1 g capsule TAKE TWO CAPSULES (2 GRAMS TOTAL) BY MOUTH TWO TIMES DAILY 360 capsule 3      Review of Systems: 10 systems reviewed and negative except per interval history/subjective  Physical Exam: Vitals:   07/26/23 0930 07/26/23 1000  BP: (!) 127/56 (!) 141/62  Pulse: 83 86  Resp: 19 19  Temp:    SpO2: 100% 100%   No intake/output data recorded. No intake or output data in the 24 hours ending 07/26/23 1005 Constitutional: Ill-appearing, lying in bed, no distress ENMT: ears and nose without scars or lesions, MMM CV: normal rate, no edema Respiratory: Lateral chest rise, normal work of breathing Gastrointestinal: soft, non-tender, no palpable masses or hernias Skin: no visible lesions or rashes Psych: Awake and alert, appropriate mood and affect   Test Results I personally reviewed new and old clinical labs and radiology tests Lab Results  Component Value Date   NA 129 (L) 07/26/2023   K 4.3 07/26/2023   CL 92 (L) 07/26/2023   CO2 23 07/26/2023   BUN 52 (H) 07/26/2023    CREATININE 4.09 (H) 07/26/2023   GFR 70.12 04/13/2015   CALCIUM  9.2 07/26/2023   ALBUMIN  3.6 07/26/2023   PHOS 5.9 (H) 07/26/2023    CBC Recent Labs  Lab 07/24/23 2253 07/26/23 0343  WBC 8.5 6.8  HGB 11.5* 10.8*  HCT 36.2 33.6*  MCV 98.1 97.1  PLT 90* 77*

## 2023-07-26 NOTE — ED Notes (Signed)
[facility] here for transport. 

## 2023-07-26 NOTE — ED Notes (Signed)
 Pt transported to dialysis. Pt's brother [name] [name] states he is going out for breakfast while pt is getting treatment. Brother asked to be added to pt's contact list and pt agreed.

## 2023-07-26 NOTE — ED Notes (Signed)
 Pt coughing up pink, frothy sputum. Dr. Duard Larsen made aware of results of BNP, states he will now order a CXR and possibly diurese

## 2023-07-27 ENCOUNTER — Inpatient Hospital Stay (HOSPITAL_COMMUNITY): Payer: Medicare PPO

## 2023-07-27 ENCOUNTER — Encounter (HOSPITAL_COMMUNITY): Payer: Self-pay | Admitting: Cardiology

## 2023-07-27 DIAGNOSIS — R410 Disorientation, unspecified: Secondary | ICD-10-CM

## 2023-07-27 DIAGNOSIS — J90 Pleural effusion, not elsewhere classified: Secondary | ICD-10-CM

## 2023-07-27 DIAGNOSIS — J101 Influenza due to other identified influenza virus with other respiratory manifestations: Secondary | ICD-10-CM | POA: Diagnosis not present

## 2023-07-27 DIAGNOSIS — R531 Weakness: Secondary | ICD-10-CM

## 2023-07-27 LAB — PROTEIN, PLEURAL OR PERITONEAL FLUID: Total protein, fluid: 3.5 g/dL

## 2023-07-27 LAB — CBC WITH DIFFERENTIAL/PLATELET
Abs Immature Granulocytes: 0.02 10*3/uL (ref 0.00–0.07)
Basophils Absolute: 0 10*3/uL (ref 0.0–0.1)
Basophils Relative: 0 %
Eosinophils Absolute: 0.1 10*3/uL (ref 0.0–0.5)
Eosinophils Relative: 1 %
HCT: 34.6 % — ABNORMAL LOW (ref 36.0–46.0)
Hemoglobin: 11.3 g/dL — ABNORMAL LOW (ref 12.0–15.0)
Immature Granulocytes: 0 %
Lymphocytes Relative: 15 %
Lymphs Abs: 0.8 10*3/uL (ref 0.7–4.0)
MCH: 31.1 pg (ref 26.0–34.0)
MCHC: 32.7 g/dL (ref 30.0–36.0)
MCV: 95.3 fL (ref 80.0–100.0)
Monocytes Absolute: 0.7 10*3/uL (ref 0.1–1.0)
Monocytes Relative: 13 %
Neutro Abs: 3.5 10*3/uL (ref 1.7–7.7)
Neutrophils Relative %: 71 %
Platelets: 82 10*3/uL — ABNORMAL LOW (ref 150–400)
RBC: 3.63 MIL/uL — ABNORMAL LOW (ref 3.87–5.11)
RDW: 14.8 % (ref 11.5–15.5)
WBC: 5 10*3/uL (ref 4.0–10.5)
nRBC: 0 % (ref 0.0–0.2)

## 2023-07-27 LAB — BODY FLUID CELL COUNT WITH DIFFERENTIAL
Eos, Fluid: 0 %
Lymphs, Fluid: 70 %
Monocyte-Macrophage-Serous Fluid: 25 % — ABNORMAL LOW (ref 50–90)
Neutrophil Count, Fluid: 5 % (ref 0–25)
Total Nucleated Cell Count, Fluid: 444 uL (ref 0–1000)

## 2023-07-27 LAB — PROTIME-INR
INR: 2.4 — ABNORMAL HIGH (ref 0.8–1.2)
Prothrombin Time: 26.7 s — ABNORMAL HIGH (ref 11.4–15.2)

## 2023-07-27 LAB — HEPATITIS B SURFACE ANTIBODY, QUANTITATIVE: Hep B S AB Quant (Post): 13249 m[IU]/mL

## 2023-07-27 LAB — GRAM STAIN: Gram Stain: NONE SEEN

## 2023-07-27 LAB — COMPREHENSIVE METABOLIC PANEL
ALT: 12 U/L (ref 0–44)
AST: 30 U/L (ref 15–41)
Albumin: 3.4 g/dL — ABNORMAL LOW (ref 3.5–5.0)
Alkaline Phosphatase: 180 U/L — ABNORMAL HIGH (ref 38–126)
Anion gap: 14 (ref 5–15)
BUN: 40 mg/dL — ABNORMAL HIGH (ref 8–23)
CO2: 25 mmol/L (ref 22–32)
Calcium: 9.7 mg/dL (ref 8.9–10.3)
Chloride: 92 mmol/L — ABNORMAL LOW (ref 98–111)
Creatinine, Ser: 3.86 mg/dL — ABNORMAL HIGH (ref 0.44–1.00)
GFR, Estimated: 12 mL/min — ABNORMAL LOW (ref >60)
Glucose, Bld: 140 mg/dL — ABNORMAL HIGH (ref 70–99)
Potassium: 4 mmol/L (ref 3.5–5.1)
Sodium: 131 mmol/L — ABNORMAL LOW (ref 135–145)
Total Bilirubin: 1.1 mg/dL (ref 0.0–1.2)
Total Protein: 6.9 g/dL (ref 6.5–8.1)

## 2023-07-27 LAB — GLUCOSE, CAPILLARY
Glucose-Capillary: 100 mg/dL — ABNORMAL HIGH (ref 70–99)
Glucose-Capillary: 125 mg/dL — ABNORMAL HIGH (ref 70–99)
Glucose-Capillary: 132 mg/dL — ABNORMAL HIGH (ref 70–99)
Glucose-Capillary: 166 mg/dL — ABNORMAL HIGH (ref 70–99)
Glucose-Capillary: 185 mg/dL — ABNORMAL HIGH (ref 70–99)
Glucose-Capillary: 74 mg/dL (ref 70–99)
Glucose-Capillary: 96 mg/dL (ref 70–99)
Glucose-Capillary: 97 mg/dL (ref 70–99)

## 2023-07-27 LAB — LACTATE DEHYDROGENASE, PLEURAL OR PERITONEAL FLUID: LD, Fluid: 78 U/L — ABNORMAL HIGH (ref 3–23)

## 2023-07-27 LAB — PHOSPHORUS: Phosphorus: 5.5 mg/dL — ABNORMAL HIGH (ref 2.5–4.6)

## 2023-07-27 LAB — LACTATE DEHYDROGENASE: LDH: 186 U/L (ref 98–192)

## 2023-07-27 MED ORDER — OXYMETAZOLINE HCL 0.05 % NA SOLN
1.0000 | Freq: Once | NASAL | Status: AC
  Administered 2023-07-27: 1 via NASAL
  Filled 2023-07-27 (×2): qty 30

## 2023-07-27 MED ORDER — FAMOTIDINE 20 MG PO TABS
10.0000 mg | ORAL_TABLET | Freq: Every day | ORAL | Status: DC
  Administered 2023-07-27 – 2023-07-31 (×5): 10 mg via ORAL
  Filled 2023-07-27 (×5): qty 1

## 2023-07-27 MED ORDER — INSULIN GLARGINE-YFGN 100 UNIT/ML ~~LOC~~ SOLN
32.0000 [IU] | Freq: Every day | SUBCUTANEOUS | Status: DC
  Filled 2023-07-27: qty 0.32

## 2023-07-27 MED ORDER — GABAPENTIN 100 MG PO CAPS
100.0000 mg | ORAL_CAPSULE | Freq: Every day | ORAL | Status: DC
Start: 2023-07-27 — End: 2023-08-01
  Administered 2023-07-27 – 2023-07-31 (×5): 100 mg via ORAL
  Filled 2023-07-27 (×6): qty 1

## 2023-07-27 MED ORDER — FLUCONAZOLE 150 MG PO TABS
150.0000 mg | ORAL_TABLET | Freq: Once | ORAL | Status: AC
  Administered 2023-07-27: 150 mg via ORAL
  Filled 2023-07-27 (×2): qty 1

## 2023-07-27 MED ORDER — INSULIN GLULISINE 100 UNIT/ML IJ SOLN
0.0000 [IU] | Freq: Three times a day (TID) | INTRAMUSCULAR | Status: DC
Start: 2023-07-27 — End: 2023-08-01
  Administered 2023-07-28 – 2023-08-01 (×6): 1 [IU] via SUBCUTANEOUS
  Filled 2023-07-27: qty 10

## 2023-07-27 MED ORDER — INSULIN GLARGINE-YFGN 100 UNIT/ML ~~LOC~~ SOLN
25.0000 [IU] | Freq: Every day | SUBCUTANEOUS | Status: DC
  Filled 2023-07-27 (×3): qty 0.25

## 2023-07-27 MED ORDER — LIDOCAINE HCL (PF) 1 % IJ SOLN
10.0000 mL | Freq: Once | INTRAMUSCULAR | Status: AC
  Administered 2023-07-27: 10 mL via INTRADERMAL

## 2023-07-27 MED ORDER — INSULIN ASPART 100 UNIT/ML IJ SOLN
0.0000 [IU] | Freq: Three times a day (TID) | INTRAMUSCULAR | Status: DC
Start: 2023-07-27 — End: 2023-07-27

## 2023-07-27 NOTE — Plan of Care (Signed)
   Problem: Education: Goal: Ability to describe self-care measures that may prevent or decrease complications (Diabetes Survival Skills Education) will improve Outcome: Progressing Goal: Individualized Educational Video(s) Outcome: Progressing   Problem: Coping: Goal: Ability to adjust to condition or change in health will improve Outcome: Progressing

## 2023-07-27 NOTE — Progress Notes (Signed)
 [name] Progress Note   Subjective:  Transferred from [facility]. Seen in room. Daughter at bedside. Feeling slightly better today, still coughing. Had dialysis yesterday with 3L UF.  Loculated pleural effusion on CXR --Plan for thoracentesis today   Objective Vitals:   07/27/23 0036 07/27/23 0413 07/27/23 0500 07/27/23 0744  BP: (!) 112/46 (!) 114/46  (!) 122/57  Pulse: 79 80  86  Resp: 16 16  17   Temp: 98.1 F (36.7 C) 98.1 F (36.7 C)  98.3 F (36.8 C)  TempSrc: Oral Oral  Oral  SpO2: 100% 98%  97%  Weight:   51.2 kg   Height:         Additional Objective Labs: Basic Metabolic Panel: Recent Labs  Lab 07/24/23 2253 07/26/23 0343 07/27/23 0518  NA 131* 129* 131*  K 4.0 4.3 4.0  CL 94* 92* 92*  CO2 26 23 25   GLUCOSE 133* 151* 140*  BUN 35* 52* 40*  CREATININE 3.58* 4.09* 3.86*  CALCIUM  10.0 9.2 9.7  PHOS  --  5.9* 5.5*   CBC: Recent Labs  Lab 07/24/23 2253 07/26/23 0343 07/27/23 0518  WBC 8.5 6.8 5.0  NEUTROABS  --   --  3.5  HGB 11.5* 10.8* 11.3*  HCT 36.2 33.6* 34.6*  MCV 98.1 97.1 95.3  PLT 90* 77* 82*   Blood Culture    Component Value Date/Time   SDES BLOOD BLOOD LEFT ARM 07/25/2023 0036   SDES BLOOD BLOOD LEFT HAND 07/25/2023 0036   SPECREQUEST  07/25/2023 0036    BOTTLES DRAWN AEROBIC AND ANAEROBIC Blood Culture adequate volume   SPECREQUEST  07/25/2023 0036    BOTTLES DRAWN AEROBIC AND ANAEROBIC Blood Culture adequate volume   CULT  07/25/2023 0036    NO GROWTH 2 DAYS Performed at [facility], [address], [place], [place] [address]    CULT  07/25/2023 0036    NO GROWTH 2 DAYS Performed at [facility], [address] [place], [place] [address]    REPTSTATUS PENDING 07/25/2023 0036   REPTSTATUS PENDING 07/25/2023 0036     Physical Exam General: Frail appearing, on 2L [place], nad  Heart: RRR Lungs: Diminished at bases  Abdomen: soft  Extremities: No LE edema  Dialysis Access: TDC, R arm avf +bruit   Medications:    apixaban   5 mg Oral BID   aspirin  EC  81 mg Oral Daily   Chlorhexidine  Gluconate Cloth  6 each Topical Q0600   escitalopram   5 mg Oral QHS   famotidine   40 mg Oral QHS   fluticasone   2 spray Each Nare Daily   insulin  glargine-yfgn  38 Units Subcutaneous Daily   lactulose   20 g Oral BID   levofloxacin   500 mg Oral Q48H   metoprolol  succinate  12.5 mg Oral Daily   midodrine   5 mg Oral Once per day on Sunday Tuesday Thursday Saturday   sodium chloride  flush  3 mL Intravenous Q12H    Dialysis Orders:  Home HD (in training) MTTF, EDW 52kg, 3hrs, 2k, currently on IV iron    Assessment/Plan: 1. Influenza pneumonia/loculated right pleural effusion - Management per primary team. Plan for thoracentesis in IR today  2. AMS/delirium - likely secondary to acute illness. Resolved. Back to baseline today.  3. ESRD - Just started home training this week. Plan to maintain MWF schedule here. Next HD Mon. 1/6.  4. HTN/volume-  BP controlled. At dry weight. On midodrine .  Net UF 3L Post HD wt 51.2kg on 07/26/23.  5. Anemia-  Hgb 11.3. No ESA needs. Hold IV Fe in setting of infection.  6. 2HPTH -  Ca/Phos acceptable. Continue to monitor  7. DMT1  - per primary  8. Afib with RVR - Resolved. On metoprolol /apixaban     [name] Ronnald Acosta PA-C [facility] 07/27/2023,9:21 AM

## 2023-07-27 NOTE — Evaluation (Addendum)
 Physical Therapy Evaluation Patient Details Name: [name] [name] MRN: [id number] DOB: 08-14-1950 Today's Date: 07/27/2023  History of Present Illness  Patient is a 73 yo female presenting to [facility] on 07/24/23 with weakness and cough, transferred to [facility] on 07/26/23 found to have encephalopathy in the setting of influenza infection. PMH includes: aortic stenosis, CHF, chronic kidney disease, diabetes, CAD  Clinical Impression  Pt admitted with above diagnosis. Family present and supportive. Ambulates 50 feet today with rollator for support. SpO2 93% on room air, VSS. CGA to ambulate safely with intermittent instability. Family available 24/7 at d/c. Would benefit from HHPT follow-up. 5 steps to enter home with rails. Pt currently with functional limitations due to the deficits listed below (see PT Problem List). Pt will benefit from acute skilled PT to increase their independence and safety with mobility to allow discharge.           If plan is discharge home, recommend the following: A little help with walking and/or transfers;A little help with bathing/dressing/bathroom;Assistance with cooking/housework;Assist for transportation;Help with stairs or ramp for entrance;Supervision due to cognitive status   Can travel by private vehicle        Equipment Recommendations None recommended by PT  Recommendations for Other Services       Functional Status Assessment Patient has had a recent decline in their functional status and demonstrates the ability to make significant improvements in function in a reasonable and predictable amount of time.     Precautions / Restrictions Precautions Precautions: Fall Precaution Comments: monitor O2 Restrictions Weight Bearing Restrictions Per Provider Order: No      Mobility  Bed Mobility               General bed mobility comments: standing with staff when PT entered room    Transfers Overall transfer level: Needs assistance Equipment used:  Rolling walker (2 wheels) Transfers: Sit to/from Stand Sit to Stand: Supervision           General transfer comment: supervision for safety from toilet. No assist needed. Required VC for set-up with rollator (lock breaks) prior to rising from bed.    Ambulation/Gait Ambulation/Gait assistance: Contact guard assist Gait Distance (Feet): 50 Feet Assistive device: Rollator (4 wheels) Gait Pattern/deviations: Step-through pattern, Decreased stride length, Drifts right/left, Trunk flexed Gait velocity: dec Gait velocity interpretation: <1.31 ft/sec, indicative of household ambulator   General Gait Details: Mild instability present, requiring CGA at all times. Using rollator for support. Cues for awareness of surroundings. Able to navigate congested areas with close supervision. Demonstrates moderate instability taking steps backwards with walker. Max cues for safety and alignment. SpO2 93% on RA.  Stairs            Wheelchair Mobility     Tilt Bed    Modified Rankin (Stroke Patients Only)       Balance Overall balance assessment: Needs assistance Sitting-balance support: No upper extremity supported, Feet supported Sitting balance-Leahy Scale: Good     Standing balance support: No upper extremity supported Standing balance-Leahy Scale: Fair Standing balance comment: More stable with rollator for support                             Pertinent Vitals/Pain Pain Assessment Pain Assessment: No/denies pain    Home Living Family/patient expects to be discharged to:: Private residence Living Arrangements: Spouse/significant other Available Help at Discharge: Family;Available 24 hours/day Type of Home: House Home Access: Stairs to  enter Entrance Stairs-Rails: Can reach both Entrance Stairs-Number of Steps: 5 Alternate Level Stairs-Number of Steps: 13 Home Layout: Two level;Able to live on main level with bedroom/bathroom;Full bath on main level Home  Equipment: Wheelchair - manual;Shower seat - built in;Rollator (4 wheels);Grab bars - tub/shower Additional Comments: 1 fall a month ago, over reached for toilet paper    Prior Function Prior Level of Function : Independent/Modified Independent;Driving;History of Falls (last six months)             Mobility Comments: ind no AD ADLs Comments: Ind no AD     Extremity/Trunk Assessment   Upper Extremity Assessment Upper Extremity Assessment: Defer to OT evaluation    Lower Extremity Assessment Lower Extremity Assessment: Generalized weakness       Communication   Communication Communication: No apparent difficulties  Cognition Arousal: Alert Behavior During Therapy: WFL for tasks assessed/performed Overall Cognitive Status: Impaired/Different from baseline Area of Impairment: Problem solving, Following commands                       Following Commands: Follows one step commands consistently, Follows one step commands with increased time     Problem Solving: Slow processing, Difficulty sequencing, Requires verbal cues          General Comments General comments (skin integrity, edema, etc.): VSS throughout on RA.    Exercises     Assessment/Plan    PT Assessment Patient needs continued PT services  PT Problem List Decreased strength;Decreased activity tolerance;Decreased balance;Decreased mobility;Decreased cognition;Decreased knowledge of use of DME;Decreased safety awareness;Decreased knowledge of precautions;Cardiopulmonary status limiting activity       PT Treatment Interventions DME instruction;Gait training;Stair training;Functional mobility training;Therapeutic activities;Therapeutic exercise;Balance training;Neuromuscular re-education;Patient/family education;Cognitive remediation    PT Goals (Current goals can be found in the Care Plan section)  Acute Rehab PT Goals Patient Stated Goal: get well PT Goal Formulation: With patient/family Time For  Goal Achievement: 08/10/23 Potential to Achieve Goals: Good    Frequency Min 1X/week     Co-evaluation               AM-PAC PT 6 Clicks Mobility  Outcome Measure Help needed turning from your back to your side while in a flat bed without using bedrails?: None Help needed moving from lying on your back to sitting on the side of a flat bed without using bedrails?: A Little Help needed moving to and from a bed to a chair (including a wheelchair)?: A Little Help needed standing up from a chair using your arms (e.g., wheelchair or bedside chair)?: A Little Help needed to walk in hospital room?: A Little Help needed climbing 3-5 steps with a railing? : A Little 6 Click Score: 19    End of Session Equipment Utilized During Treatment: Gait belt Activity Tolerance: Patient tolerated treatment well Patient left: in chair;with call bell/phone within reach;with chair alarm set;with family/visitor present   PT Visit Diagnosis: Unsteadiness on feet (R26.81);Other abnormalities of gait and mobility (R26.89);Muscle weakness (generalized) (M62.81);Other symptoms and signs involving the nervous system (R29.898)    Time: 1532-1600 PT Time Calculation (min) (ACUTE ONLY): 28 min   Charges:   PT Evaluation $PT Eval Low Complexity: 1 Low PT Treatments $Gait Training: 8-22 mins PT General Charges $$ ACUTE PT VISIT: 1 Visit         Leontine Roads, PT, DPT [facility]  Rehabilitation Services Physical Therapist Office: [phone] Website: [email or web address]  Addendum for start time  Leontine GORMAN Roads  07/27/2023, 5:13 PM

## 2023-07-27 NOTE — Progress Notes (Addendum)
 PROGRESS NOTE        PATIENT DETAILS Name: [name] [name] Age: 73 y.o. Sex: female Date of Birth: November 08, 1950 Admit Date: 07/24/2023 Admitting Physician Dorn Dawson, MD ERE:Yjhozm, Vernell, MD  Brief Summary: Patient is a 73 y.o.  female with history of ESRD on HD M/T/TH/F, DM-1 on insulin  pump, CAD s/p CABG x 2, s/p bioprosthetic AVR, A-fib on Eliquis , s/p PPM in place, RCC-s/p right nephrectomy, remote history of lymphoma-presented to [facility] on 1/1 with weakness/cough-found to have encephalopathy in the setting of influenza infection.  Significant events: 1/1>> admit to hospitalist service at [facility] 1/3>> worsening hypoxemia after vomiting-possible aspiration event 1/3>> transferred to [facility]  Significant studies: 1/1>> CXR: Loculated-moderate right-sided pleural effusion.  Diffuse hazy airspace disease right lung. 1/2>> CT head: No acute intracranial abnormality  Significant microbiology data: 1/1>> influenza A PCR: Positive 1/1>> COVID/influenza B/RSV PCR: Negative 1/2>> COVID PCR: Negative  Procedures: None  Consults: None  Subjective: Completely awake and alert-titrated down to 2 L of oxygen.  Objective: Vitals: Blood pressure (!) 122/57, pulse 86, temperature 98.3 F (36.8 C), temperature source Oral, resp. rate 17, height 5' 2 (1.575 m), weight 51.2 kg, SpO2 97%.   Exam: Gen Exam:Alert awake-not in any distress HEENT:atraumatic, normocephalic Chest: B/L clear to auscultation anteriorly CVS:S1S2 regular Abdomen:soft non tender, non distended Extremities:no edema Neurology: Non focal Skin: no rash  Pertinent Labs/Radiology:    Latest Ref Rng & Units 07/27/2023    5:18 AM 07/26/2023    3:43 AM 07/24/2023   10:53 PM  CBC  WBC 4.0 - 10.5 K/uL 5.0  6.8  8.5   Hemoglobin 12.0 - 15.0 g/dL 88.6  89.1  88.4   Hematocrit 36.0 - 46.0 % 34.6  33.6  36.2   Platelets 150 - 400 K/uL 82  77  90     Lab Results  Component Value Date   NA 131 (L)  07/27/2023   K 4.0 07/27/2023   CL 92 (L) 07/27/2023   CO2 25 07/27/2023      Assessment/Plan: Acute metabolic encephalopathy Secondary to influenza infection Resolved with supportive care  Acute hypoxic respiratory failure secondary to influenza A PNA-possible aspiration PNA Hypoxia has improved She previously has been intolerant to Tamiflu (per pt-has had nausea/vomiting on prior 2 attempts) Continue levofloxacin -hypoxia seems to be much better.  Sepsis secondary to influenza/PNA Improved Cultures as above On Levaquin   Loculated right-sided pleural effusion This does not appear to be a new finding-was present in her most recent PET scan done in October 2024 In any event-thoracocentesis scheduled today-will await pleural fluid studies-May need to touch base with pulmonology at some point.  PAF with RVR Triggered by PNA/influenza Maintaining sinus rhythm Continue metoprolol /Eliquis  Telemetry monitoring  Nausea/vomiting-aspiration event on 1/3 Following a coughing spell No further nausea/vomiting  ESRD on HD M/T/T/F Nephrology following  Chronic HFpEF Euvolemic Volume removal with HD  Sick sinus syndrome-s/p PPM implantation Telemetry monitoring  CAD s/p CABG x 2 (2005/2011) and bioprosthetic AVR 2011 ASA/metoprolol  Resume Repatha  injections postdischarge  HTN BP stable on metoprolol  Resume Imdur  when BP is a bit more stable.  DM-1 (A1c 6.7 on 1/1) Slight hypoglycemia early this morning Decrease Semglee  to 32 units Add sensitive SSI If clinical scenario continues to stabilize-can be started on insulin  pump prior to discharge.  Recent Labs    07/27/23 0415 07/27/23 0456 07/27/23  0743  GLUCAP 74 100* 132*   GERD Pepcid   Mood disorder Stable Xanax /Lexapro   Restless leg/peripheral neuropathy Since encephalopathy has improved-resume Neurontin   History of Hodgkin's lymphoma S/p mantle radiation 1981-in remission since then Follows with  oncology  Renal cell carcinoma-s/p right nephrectomy 2008 Follows with oncology  Left breast DCIS-s/p left mastectomy 2016 Follows with oncology  Debility/deconditioning PT/OT eval ordered  OSA BiPAP  Underweight: Estimated body mass index is 20.65 kg/m as calculated from the following:   Height as of this encounter: 5' 2 (1.575 m).   Weight as of this encounter: 51.2 kg.   Code status:   Code Status: Full Code   DVT Prophylaxis: apixaban  (ELIQUIS ) tablet 5 mg    Family Communication: Daughter at bedside   Disposition Plan: Status is: Inpatient Remains inpatient appropriate because: Severity of illness   Planned Discharge Destination:Home   Diet: Diet Order             Diet Carb Modified Fluid consistency: Thin; Room service appropriate? Yes  Diet effective now                     Antimicrobial agents: Anti-infectives (From admission, onward)    Start     Dose/Rate Route Frequency Ordered Stop   07/27/23 0800  levofloxacin  (LEVAQUIN ) tablet 500 mg        500 mg Oral Every 48 hours 07/25/23 0329 07/31/23 0759   07/25/23 0330  levofloxacin  (LEVAQUIN ) tablet 750 mg        750 mg Oral  Once 07/25/23 0329 07/25/23 0400        MEDICATIONS: Scheduled Meds:  apixaban   5 mg Oral BID   aspirin  EC  81 mg Oral Daily   Chlorhexidine  Gluconate Cloth  6 each Topical Q0600   escitalopram   5 mg Oral QHS   famotidine   40 mg Oral QHS   fluticasone   2 spray Each Nare Daily   insulin  glargine-yfgn  38 Units Subcutaneous Daily   lactulose   20 g Oral BID   levofloxacin   500 mg Oral Q48H   metoprolol  succinate  12.5 mg Oral Daily   midodrine   5 mg Oral Once per day on Sunday Tuesday Thursday Saturday   sodium chloride  flush  3 mL Intravenous Q12H   Continuous Infusions: PRN Meds:.acetaminophen , ALPRAZolam , alteplase , heparin , levalbuterol , lidocaine  (PF), lidocaine -prilocaine , ondansetron  (ZOFRAN ) IV, mouth rinse, pentafluoroprop-tetrafluoroeth, sodium  chloride   I have personally reviewed following labs and imaging studies  LABORATORY DATA: CBC: Recent Labs  Lab 07/24/23 2253 07/26/23 0343 07/27/23 0518  WBC 8.5 6.8 5.0  NEUTROABS  --   --  3.5  HGB 11.5* 10.8* 11.3*  HCT 36.2 33.6* 34.6*  MCV 98.1 97.1 95.3  PLT 90* 77* 82*    Basic Metabolic Panel: Recent Labs  Lab 07/24/23 2253 07/26/23 0343 07/27/23 0518  NA 131* 129* 131*  K 4.0 4.3 4.0  CL 94* 92* 92*  CO2 26 23 25   GLUCOSE 133* 151* 140*  BUN 35* 52* 40*  CREATININE 3.58* 4.09* 3.86*  CALCIUM  10.0 9.2 9.7  PHOS  --  5.9* 5.5*    GFR: Estimated Creatinine Clearance: 10.7 mL/min (A) (by C-G formula based on SCr of 3.86 mg/dL (H)).  Liver Function Tests: Recent Labs  Lab 07/26/23 0343 07/27/23 0518  AST  --  30  ALT  --  12  ALKPHOS  --  180*  BILITOT  --  1.1  PROT  --  6.9  ALBUMIN  3.6  3.4*   No results for input(s): LIPASE, AMYLASE in the last 168 hours. Recent Labs  Lab 07/25/23 0036  AMMONIA 43*    Coagulation Profile: Recent Labs  Lab 07/24/23 2253  INR 2.3*    Cardiac Enzymes: No results for input(s): CKTOTAL, CKMB, CKMBINDEX, TROPONINI in the last 168 hours.  BNP (last 3 results) No results for input(s): PROBNP in the last 8760 hours.  Lipid Profile: No results for input(s): CHOL, HDL, LDLCALC, TRIG, CHOLHDL, LDLDIRECT in the last 72 hours.  Thyroid  Function Tests: Recent Labs    07/25/23 0036  TSH 1.851    Anemia Panel: Recent Labs    07/25/23 0036  VITAMINB12 630    Urine analysis:    Component Value Date/Time   COLORURINE YELLOW 04/17/2022 1514   APPEARANCEUR CLEAR 04/17/2022 1514   LABSPEC <1.005 (L) 04/17/2022 1514   LABSPEC 1.010 10/17/2015 0808   PHURINE 6.5 04/17/2022 1514   GLUCOSEU NEGATIVE 04/17/2022 1514   GLUCOSEU 500 10/17/2015 0808   HGBUR NEGATIVE 04/17/2022 1514   BILIRUBINUR NEGATIVE 04/17/2022 1514   BILIRUBINUR Negative 10/17/2015 0808   KETONESUR NEGATIVE  04/17/2022 1514   PROTEINUR NEGATIVE 04/17/2022 1514   UROBILINOGEN 0.2 10/17/2015 0808   NITRITE NEGATIVE 04/17/2022 1514   LEUKOCYTESUR NEGATIVE 04/17/2022 1514   LEUKOCYTESUR Negative 10/17/2015 0808    Sepsis Labs: Lactic Acid, Venous    Component Value Date/Time   LATICACIDVEN 1.7 07/24/2023 2253    MICROBIOLOGY: Recent Results (from the past 240 hours)  Resp panel by RT-PCR (RSV, Flu A&B, Covid) Anterior Nasal Swab     Status: Abnormal   Collection Time: 07/24/23  9:55 PM   Specimen: Anterior Nasal Swab  Result Value Ref Range Status   SARS Coronavirus 2 by RT PCR NEGATIVE NEGATIVE Final    Comment: (NOTE) SARS-CoV-2 target nucleic acids are NOT DETECTED.  The SARS-CoV-2 RNA is generally detectable in upper respiratory specimens during the acute phase of infection. The lowest concentration of SARS-CoV-2 viral copies this assay can detect is 138 copies/mL. A negative result does not preclude SARS-Cov-2 infection and should not be used as the sole basis for treatment or other patient management decisions. A negative result may occur with  improper specimen collection/handling, submission of specimen other than nasopharyngeal swab, presence of viral mutation(s) within the areas targeted by this assay, and inadequate number of viral copies(<138 copies/mL). A negative result must be combined with clinical observations, patient history, and epidemiological information. The expected result is Negative.  Fact Sheet for Patients:  bloggercourse.com  Fact Sheet for Healthcare Providers:  seriousbroker.it  This test is no t yet approved or cleared by the [place]  FDA and  has been authorized for detection and/or diagnosis of SARS-CoV-2 by FDA under an Emergency Use Authorization (EUA). This EUA will remain  in effect (meaning this test can be used) for the duration of the COVID-19 declaration under Section 564(b)(1) of  the Act, 21 U.S.C.section 360bbb-3(b)(1), unless the authorization is terminated  or revoked sooner.       Influenza A by PCR POSITIVE (A) NEGATIVE Final   Influenza B by PCR NEGATIVE NEGATIVE Final    Comment: (NOTE) The Xpert Xpress SARS-CoV-2/FLU/RSV plus assay is intended as an aid in the diagnosis of influenza from Nasopharyngeal swab specimens and should not be used as a sole basis for treatment. Nasal washings and aspirates are unacceptable for Xpert Xpress SARS-CoV-2/FLU/RSV testing.  Fact Sheet for Patients: bloggercourse.com  Fact Sheet for Healthcare Providers: seriousbroker.it  This  test is not yet approved or cleared by the [place]  FDA and has been authorized for detection and/or diagnosis of SARS-CoV-2 by FDA under an Emergency Use Authorization (EUA). This EUA will remain in effect (meaning this test can be used) for the duration of the COVID-19 declaration under Section 564(b)(1) of the Act, 21 U.S.C. section 360bbb-3(b)(1), unless the authorization is terminated or revoked.     Resp Syncytial Virus by PCR NEGATIVE NEGATIVE Final    Comment: (NOTE) Fact Sheet for Patients: bloggercourse.com  Fact Sheet for Healthcare Providers: seriousbroker.it  This test is not yet approved or cleared by the [place]  FDA and has been authorized for detection and/or diagnosis of SARS-CoV-2 by FDA under an Emergency Use Authorization (EUA). This EUA will remain in effect (meaning this test can be used) for the duration of the COVID-19 declaration under Section 564(b)(1) of the Act, 21 U.S.C. section 360bbb-3(b)(1), unless the authorization is terminated or revoked.  Performed at [facility], [address], [place], [place] [address]   Blood Culture (routine x 2)     Status: None (Preliminary result)   Collection Time: 07/25/23 12:36 AM   Specimen: BLOOD   Result Value Ref Range Status   Specimen Description BLOOD BLOOD LEFT ARM  Final   Special Requests   Final    BOTTLES DRAWN AEROBIC AND ANAEROBIC Blood Culture adequate volume   Culture   Final    NO GROWTH 2 DAYS Performed at [facility], [address] [place], [place] [address]    Report Status PENDING  Incomplete  Blood Culture (routine x 2)     Status: None (Preliminary result)   Collection Time: 07/25/23 12:36 AM   Specimen: BLOOD  Result Value Ref Range Status   Specimen Description BLOOD BLOOD LEFT HAND  Final   Special Requests   Final    BOTTLES DRAWN AEROBIC AND ANAEROBIC Blood Culture adequate volume   Culture   Final    NO GROWTH 2 DAYS Performed at [facility], [address], [place], [place] [address]    Report Status PENDING  Incomplete    RADIOLOGY STUDIES/RESULTS: DG CHEST PORT 1 VIEW Result Date: 07/26/2023 CLINICAL DATA:  73 year old female with history of hypoxia and hemoptysis. EXAM: PORTABLE CHEST 1 VIEW COMPARISON:  Chest x-ray 07/24/2023. FINDINGS: Right internal jugular PermCath with tip projecting over the superior aspect of the right atrium. Left-sided pacemaker device in place with lead tips projecting over the right atrium and right ventricular apex. Status post median sternotomy for CABG and aortic valve replacement (a stented bioprosthesis is noted). Lung volumes are low. Irregular opacities throughout the periphery of the right mid to lower hemithorax most compatible with a partially loculated right-sided pleural effusion, similar to the recent prior study. Ill-defined opacities throughout the right lung, particularly in the right mid to lower lung, which likely reflect areas of underlying atelectasis and/or consolidation. Left lung appears clear. No left pleural effusion. No pneumothorax. No evidence of pulmonary edema. Heart size is upper limits of normal. Atherosclerotic calcifications in the thoracic aorta. IMPRESSION: 1. Moderate partially loculated  right-sided pleural effusion with extensive areas of atelectasis and/or consolidation throughout the right mid to lower lung, similar to the recent prior study. 2. Aortic atherosclerosis. 3. Postoperative changes and support apparatus, as above. Electronically Signed   By: Toribio Aye M.D.   On: 07/26/2023 05:36     LOS: 2 days   Donalda Applebaum, MD  [facility]    To contact the  attending provider between 7A-7P or the covering provider during after hours 7P-7A, please log into the web site [email or web address] and access using universal [place] password for that web site. If you do not have the password, please call the hospital operator.  07/27/2023, 9:28 AM

## 2023-07-27 NOTE — Procedures (Signed)
 PROCEDURE SUMMARY:  Successful US  guided right thoracentesis. Yielded 400 mL of amber fluid. Pt tolerated procedure well. No immediate complications.  Specimen was sent for labs. CXR ordered.  EBL < 5 mL  Solmon Selmer Ku PA-C 07/27/2023 12:07 PM

## 2023-07-27 NOTE — Evaluation (Signed)
 Occupational Therapy Evaluation Patient Details Name: [name] [name] MRN: [id number] DOB: 21-Nov-1950 Today's Date: 07/27/2023   History of Present Illness Patient is a 73 yo female presenting to [facility] on 07/24/23 with weakness and cough, transferred to [facility] on 07/26/23 found to have encephalopathy in the setting of influenza infection. PMH includes: aortic stenosis, CHF, chronic kidney disease, diabetes, CAD   Clinical Impression   Pt admitted for above, her cognition seemingly back to baseline as no deficits noted from family in room. Pt remains generally weak, needing min A for STS with cues to facilitate smoother transitions and cues for safety with rollator brakes. Per family, pt more sedentary recently, displays less activity tolerance than her normal at time of eval. Pt would benefit from continued acute skilled OT services to address deficits and help transition to next level of care.        If plan is discharge home, recommend the following: Help with stairs or ramp for entrance;Assist for transportation;Assistance with cooking/housework;A little help with walking and/or transfers    Functional Status Assessment  Patient has had a recent decline in their functional status and demonstrates the ability to make significant improvements in function in a reasonable and predictable amount of time.  Equipment Recommendations  BSC/3in1    Recommendations for Other Services       Precautions / Restrictions Precautions Precautions: Fall Precaution Comments: monitor O2 Restrictions Weight Bearing Restrictions Per Provider Order: No      Mobility Bed Mobility Overal bed mobility: Needs Assistance Bed Mobility: Supine to Sit     Supine to sit: Min assist, HOB elevated, Used rails     General bed mobility comments: min A to raise trunk, mod A to scoot to EOB    Transfers Overall transfer level: Needs assistance Equipment used: Rolling walker (2 wheels) Transfers: Sit to/from  Stand Sit to Stand: Min assist           General transfer comment: STSx2 from EOB min A, cues needed to lock brakes on rollator. Step by step cues to sequence hips forward/back when sitting and standing as well as keeping chest up with promotion of forward lean      Balance Overall balance assessment: Needs assistance Sitting-balance support: No upper extremity supported, Feet supported Sitting balance-Leahy Scale: Good     Standing balance support: No upper extremity supported Standing balance-Leahy Scale: Fair Standing balance comment: More stable with rollator for support                           ADL either performed or assessed with clinical judgement   ADL Overall ADL's : Needs assistance/impaired Eating/Feeding: Independent;Sitting   Grooming: Standing;Contact guard assist   Upper Body Bathing: Sitting;Set up   Lower Body Bathing: Sitting/lateral leans;Set up   Upper Body Dressing : Sitting;Set up   Lower Body Dressing: Set up;Sitting/lateral leans Lower Body Dressing Details (indicate cue type and reason): donning bilat slippers, more assist needed for STS dressing Toilet Transfer: Rolling walker (2 wheels);Minimal assistance Toilet Transfer Details (indicate cue type and reason): Min A needed for STS Toileting- Clothing Manipulation and Hygiene: Moderate assistance;Sit to/from stand       Functional mobility during ADLs: Contact guard assist;Rolling walker (2 wheels) General ADL Comments: Discussed with pt family the benefits of meaningful hobbies to promoted mobility for pt as she has been more sedentary at home     Vision  Perception         Praxis         Pertinent Vitals/Pain Pain Assessment Pain Assessment: No/denies pain     Extremity/Trunk Assessment Upper Extremity Assessment Upper Extremity Assessment: Generalized weakness   Lower Extremity Assessment Lower Extremity Assessment: Generalized weakness        Communication Communication Communication: No apparent difficulties   Cognition Arousal: Alert Behavior During Therapy: WFL for tasks assessed/performed Overall Cognitive Status: Impaired/Different from baseline Area of Impairment: Safety/judgement, Problem solving                         Safety/Judgement: Decreased awareness of safety   Problem Solving: Slow processing, Requires verbal cues       General Comments  VSS on RA. pt son and daughter at bedside. BP sitting EOB 117/39 (61), 118/40(65) standing at bedside with c/o light headedness. RN notified    Exercises     Shoulder Instructions      Home Living Family/patient expects to be discharged to:: Private residence Living Arrangements: Spouse/significant other Available Help at Discharge: Family;Available 24 hours/day Type of Home: House Home Access: Stairs to enter [employer] of Steps: 5 Entrance Stairs-Rails: Can reach both Home Layout: Two level;Able to live on main level with bedroom/bathroom;Full bath on main level Alternate Level Stairs-Number of Steps: 13 Alternate Level Stairs-Rails: Left Bathroom Shower/Tub: Producer, Television/film/video: Handicapped height     Home Equipment: Wheelchair - Manufacturing Systems Engineer - built in;Rollator (4 wheels);Grab bars - tub/shower   Additional Comments: 1 fall a month ago, over reached for toilet paper      Prior Functioning/Environment Prior Level of Function : Independent/Modified Independent;Driving;History of Falls (last six months)             Mobility Comments: ind no AD ADLs Comments: Ind no AD        OT Problem List: Decreased strength;Impaired balance (sitting and/or standing);Decreased activity tolerance      OT Treatment/Interventions: Self-care/ADL training;Patient/family education;Therapeutic exercise;Balance training;Therapeutic activities;DME and/or AE instruction    OT Goals(Current goals can be found in the care plan  section) Acute Rehab OT Goals Patient Stated Goal: To get stronger OT Goal Formulation: With patient/family Time For Goal Achievement: 08/10/23 Potential to Achieve Goals: Good  OT Frequency: Min 1X/week    Co-evaluation              AM-PAC OT 6 Clicks Daily Activity     Outcome Measure Help from another person eating meals?: None Help from another person taking care of personal grooming?: A Little Help from another person toileting, which includes using toliet, bedpan, or urinal?: A Little Help from another person bathing (including washing, rinsing, drying)?: A Little Help from another person to put on and taking off regular upper body clothing?: A Little Help from another person to put on and taking off regular lower body clothing?: A Little 6 Click Score: 19   End of Session Equipment Utilized During Treatment: Gait belt;Rollator (4 wheels) Nurse Communication: Mobility status  Activity Tolerance: Patient tolerated treatment well Patient left: Other (comment) (Pt left standing at bed side sink with PT at her side providing level of care)  OT Visit Diagnosis: Unsteadiness on feet (R26.81);Muscle weakness (generalized) (M62.81);Other symptoms and signs involving cognitive function                Time: 1459-1531 OT Time Calculation (min): 32 min Charges:  OT General Charges $OT Visit: 1  Visit OT Evaluation $OT Eval Moderate Complexity: 1 Mod OT Treatments $Therapeutic Activity: 8-22 mins  07/27/2023  AB, OTR/L  Acute Rehabilitation Services  Office: [phone]   Curtistine JONETTA Das 07/27/2023, 5:35 PM

## 2023-07-27 NOTE — Plan of Care (Signed)
  Problem: Education: Goal: Ability to describe self-care measures that may prevent or decrease complications (Diabetes Survival Skills Education) will improve Outcome: Progressing Goal: Individualized Educational Video(s) Outcome: Progressing   Problem: Coping: Goal: Ability to adjust to condition or change in health will improve Outcome: Progressing   Problem: Fluid Volume: Goal: Ability to maintain a balanced intake and output will improve Outcome: Progressing   Problem: Health Behavior/Discharge Planning: Goal: Ability to identify and utilize available resources and services will improve Outcome: Progressing Goal: Ability to manage health-related needs will improve Outcome: Progressing   Problem: Metabolic: Goal: Ability to maintain appropriate glucose levels will improve Outcome: Progressing   Problem: Nutritional: Goal: Maintenance of adequate nutrition will improve Outcome: Progressing Goal: Progress toward achieving an optimal weight will improve Outcome: Progressing   Problem: Skin Integrity: Goal: Risk for impaired skin integrity will decrease Outcome: Progressing   Problem: Tissue Perfusion: Goal: Adequacy of tissue perfusion will improve Outcome: Progressing   Problem: Education: Goal: Knowledge of General Education information will improve Description: Including pain rating scale, medication(s)/side effects and non-pharmacologic comfort measures Outcome: Progressing   Problem: Health Behavior/Discharge Planning: Goal: Ability to manage health-related needs will improve Outcome: Progressing   Problem: Clinical Measurements: Goal: Ability to maintain clinical measurements within normal limits will improve Outcome: Progressing Goal: Will remain free from infection Outcome: Progressing Goal: Diagnostic test results will improve Outcome: Progressing Goal: Respiratory complications will improve Outcome: Progressing Goal: Cardiovascular complication will  be avoided Outcome: Progressing   Problem: Activity: Goal: Risk for activity intolerance will decrease Outcome: Progressing   Problem: Nutrition: Goal: Adequate nutrition will be maintained Outcome: Progressing   Problem: Coping: Goal: Level of anxiety will decrease Outcome: Progressing   Problem: Elimination: Goal: Will not experience complications related to bowel motility Outcome: Progressing Goal: Will not experience complications related to urinary retention Outcome: Progressing   Problem: Pain Management: Goal: General experience of comfort will improve Outcome: Progressing   Problem: Safety: Goal: Ability to remain free from injury will improve Outcome: Progressing   Problem: Skin Integrity: Goal: Risk for impaired skin integrity will decrease Outcome: Progressing   Problem: Education: Goal: Knowledge of disease and its progression will improve Outcome: Progressing Goal: Individualized Educational Video(s) Outcome: Progressing   Problem: Fluid Volume: Goal: Compliance with measures to maintain balanced fluid volume will improve Outcome: Progressing   Problem: Health Behavior/Discharge Planning: Goal: Ability to manage health-related needs will improve Outcome: Progressing   Problem: Nutritional: Goal: Ability to make healthy dietary choices will improve Outcome: Progressing   Problem: Clinical Measurements: Goal: Complications related to the disease process, condition or treatment will be avoided or minimized Outcome: Progressing   Problem: Education: Goal: Knowledge of disease or condition will improve Outcome: Progressing Goal: Understanding of medication regimen will improve Outcome: Progressing Goal: Individualized Educational Video(s) Outcome: Progressing   Problem: Activity: Goal: Ability to tolerate increased activity will improve Outcome: Progressing   Problem: Cardiac: Goal: Ability to achieve and maintain adequate cardiopulmonary  perfusion will improve Outcome: Progressing   Problem: Health Behavior/Discharge Planning: Goal: Ability to safely manage health-related needs after discharge will improve Outcome: Progressing

## 2023-07-27 NOTE — Progress Notes (Signed)
 Placed patient on home Bipap for the night

## 2023-07-28 DIAGNOSIS — J90 Pleural effusion, not elsewhere classified: Secondary | ICD-10-CM | POA: Diagnosis not present

## 2023-07-28 DIAGNOSIS — R410 Disorientation, unspecified: Secondary | ICD-10-CM | POA: Diagnosis not present

## 2023-07-28 DIAGNOSIS — J101 Influenza due to other identified influenza virus with other respiratory manifestations: Secondary | ICD-10-CM | POA: Diagnosis not present

## 2023-07-28 DIAGNOSIS — R531 Weakness: Secondary | ICD-10-CM | POA: Diagnosis not present

## 2023-07-28 LAB — CBC
HCT: 35.2 % — ABNORMAL LOW (ref 36.0–46.0)
Hemoglobin: 11.7 g/dL — ABNORMAL LOW (ref 12.0–15.0)
MCH: 31.4 pg (ref 26.0–34.0)
MCHC: 33.2 g/dL (ref 30.0–36.0)
MCV: 94.4 fL (ref 80.0–100.0)
Platelets: 101 10*3/uL — ABNORMAL LOW (ref 150–400)
RBC: 3.73 MIL/uL — ABNORMAL LOW (ref 3.87–5.11)
RDW: 14.5 % (ref 11.5–15.5)
WBC: 5.3 10*3/uL (ref 4.0–10.5)
nRBC: 0 % (ref 0.0–0.2)

## 2023-07-28 LAB — CBC WITH DIFFERENTIAL/PLATELET
Abs Immature Granulocytes: 0.01 10*3/uL (ref 0.00–0.07)
Basophils Absolute: 0 10*3/uL (ref 0.0–0.1)
Basophils Relative: 1 %
Eosinophils Absolute: 0 10*3/uL (ref 0.0–0.5)
Eosinophils Relative: 0 %
HCT: 34.1 % — ABNORMAL LOW (ref 36.0–46.0)
Hemoglobin: 11.6 g/dL — ABNORMAL LOW (ref 12.0–15.0)
Immature Granulocytes: 0 %
Lymphocytes Relative: 16 %
Lymphs Abs: 0.9 10*3/uL (ref 0.7–4.0)
MCH: 31.5 pg (ref 26.0–34.0)
MCHC: 34 g/dL (ref 30.0–36.0)
MCV: 92.7 fL (ref 80.0–100.0)
Monocytes Absolute: 0.8 10*3/uL (ref 0.1–1.0)
Monocytes Relative: 14 %
Neutro Abs: 3.9 10*3/uL (ref 1.7–7.7)
Neutrophils Relative %: 69 %
Platelets: 94 10*3/uL — ABNORMAL LOW (ref 150–400)
RBC: 3.68 MIL/uL — ABNORMAL LOW (ref 3.87–5.11)
RDW: 14.4 % (ref 11.5–15.5)
WBC: 5.6 10*3/uL (ref 4.0–10.5)
nRBC: 0 % (ref 0.0–0.2)

## 2023-07-28 LAB — RENAL FUNCTION PANEL
Albumin: 3.2 g/dL — ABNORMAL LOW (ref 3.5–5.0)
Anion gap: 14 (ref 5–15)
BUN: 56 mg/dL — ABNORMAL HIGH (ref 8–23)
CO2: 23 mmol/L (ref 22–32)
Calcium: 9.5 mg/dL (ref 8.9–10.3)
Chloride: 90 mmol/L — ABNORMAL LOW (ref 98–111)
Creatinine, Ser: 4.1 mg/dL — ABNORMAL HIGH (ref 0.44–1.00)
GFR, Estimated: 11 mL/min — ABNORMAL LOW (ref >60)
Glucose, Bld: 129 mg/dL — ABNORMAL HIGH (ref 70–99)
Phosphorus: 4.5 mg/dL (ref 2.5–4.6)
Potassium: 4.3 mmol/L (ref 3.5–5.1)
Sodium: 127 mmol/L — ABNORMAL LOW (ref 135–145)

## 2023-07-28 LAB — GLUCOSE, CAPILLARY
Glucose-Capillary: 155 mg/dL — ABNORMAL HIGH (ref 70–99)
Glucose-Capillary: 96 mg/dL (ref 70–99)
Glucose-Capillary: 139 mg/dL — ABNORMAL HIGH (ref 70–99)
Glucose-Capillary: 188 mg/dL — ABNORMAL HIGH (ref 70–99)
Glucose-Capillary: 166 mg/dL — ABNORMAL HIGH (ref 70–99)

## 2023-07-28 MED ORDER — DEXTROMETHORPHAN POLISTIREX ER 30 MG/5ML PO SUER
30.0000 mg | Freq: Every day | ORAL | Status: DC
  Administered 2023-07-28 – 2023-07-31 (×4): 30 mg via ORAL
  Filled 2023-07-28 (×5): qty 5

## 2023-07-28 MED ORDER — BENZONATATE 100 MG PO CAPS
200.0000 mg | ORAL_CAPSULE | Freq: Three times a day (TID) | ORAL | Status: DC
  Administered 2023-07-28 – 2023-08-01 (×12): 200 mg via ORAL
  Filled 2023-07-28 (×12): qty 2

## 2023-07-28 NOTE — Progress Notes (Signed)
 [facility] Progress Note   Subjective:  Seen in room. Daughter at bedside. Up in recliner this am, eating breakfast. Still pretty weak, but feeling better. Denies cp, sob.  Objective Vitals:   07/28/23 0134 07/28/23 0331 07/28/23 0417 07/28/23 0844  BP: (!) 98/55 (!) 128/49  (!) 124/46  Pulse: 89 88  89  Resp:  20  (!) 21  Temp:  98.1 F (36.7 C)  97.9 F (36.6 C)  TempSrc:  Oral  Oral  SpO2: (!) 86% 100%  93%  Weight:   47.2 kg   Height:         Additional Objective Labs: Basic Metabolic Panel: Recent Labs  Lab 07/26/23 0343 07/27/23 0518 07/28/23 0548  NA 129* 131* 127*  K 4.3 4.0 4.3  CL 92* 92* 90*  CO2 23 25 23   GLUCOSE 151* 140* 129*  BUN 52* 40* 56*  CREATININE 4.09* 3.86* 4.10*  CALCIUM  9.2 9.7 9.5  PHOS 5.9* 5.5* 4.5   CBC: Recent Labs  Lab 07/24/23 2253 07/26/23 0343 07/27/23 0518 07/27/23 2333 07/28/23 0548  WBC 8.5 6.8 5.0 5.3 5.6  NEUTROABS  --   --  3.5  --  3.9  HGB 11.5* 10.8* 11.3* 11.7* 11.6*  HCT 36.2 33.6* 34.6* 35.2* 34.1*  MCV 98.1 97.1 95.3 94.4 92.7  PLT 90* 77* 82* 101* 94*   Blood Culture    Component Value Date/Time   SDES PLEURAL 07/27/2023 1212   SDES PLEURAL 07/27/2023 1212   SPECREQUEST NONE 07/27/2023 1212   SPECREQUEST NONE 07/27/2023 1212   CULT  07/27/2023 1212    NO GROWTH < 24 HOURS Performed at [facility], [address] [place], [place] [address]    REPTSTATUS PENDING 07/27/2023 1212   REPTSTATUS 07/27/2023 FINAL 07/27/2023 1212     Physical Exam General: Frail appearing, sitting up, on RA Heart: RRR Lungs: Diminished at bases  Abdomen: soft  Extremities: No LE edema  Dialysis Access: TDC, R arm avf +bruit   Medications:   apixaban   5 mg Oral BID   aspirin  EC  81 mg Oral Daily   benzonatate   200 mg Oral TID   Chlorhexidine  Gluconate Cloth  6 each Topical Q0600   dextromethorphan   30 mg Oral QHS   escitalopram   5 mg Oral QHS   famotidine   10 mg Oral QHS   fluticasone    2 spray Each Nare Daily   gabapentin   100 mg Oral Daily   insulin  glargine-yfgn  25 Units Subcutaneous Daily   insulin  glulisine  0-6 Units Subcutaneous TID with meals   lactulose   20 g Oral BID   levofloxacin   500 mg Oral Q48H   metoprolol  succinate  12.5 mg Oral Daily   midodrine   5 mg Oral Once per day on Sunday Tuesday Thursday Saturday   sodium chloride  flush  3 mL Intravenous Q12H    Dialysis Orders:  Home HD (in training) MTTF, EDW 52kg, 3hrs, 2k, currently on IV iron    Assessment/Plan: 1. Influenza pneumonia/loculated right pleural effusion - Management per primary team. S/p thoracentesis 1/4 --400 ml fluid removed.  2. Hypoxic resp failure - secondary to above  - improving.  3. AMS/delirium - likely secondary to acute illness. Resolved.  4. ESRD - Just started home training last week. Plan to maintain MWF schedule here. Next HD Mon. 1/6.  5. HTN/volume-  BP controlled. At dry weight. On midodrine .  Net UF 3L Post HD wt 51.2kg on 07/26/23.  6. Anemia-  Hgb 11.3. No ESA needs. Hold IV Fe in setting of infection.  7. 2HPTH -  Ca/Phos acceptable. Continue to monitor  8. DMT1  - per primary  9. Afib with RVR - Resolved. On metoprolol /apixaban     [name] PA-C [facility] 07/28/2023,9:49 AM

## 2023-07-28 NOTE — Plan of Care (Signed)
  Problem: Education: Goal: Ability to describe self-care measures that may prevent or decrease complications (Diabetes Survival Skills Education) will improve Outcome: Progressing Goal: Individualized Educational Video(s) Outcome: Progressing   Problem: Coping: Goal: Ability to adjust to condition or change in health will improve Outcome: Progressing   Problem: Fluid Volume: Goal: Ability to maintain a balanced intake and output will improve Outcome: Progressing   Problem: Health Behavior/Discharge Planning: Goal: Ability to identify and utilize available resources and services will improve Outcome: Progressing Goal: Ability to manage health-related needs will improve Outcome: Progressing   Problem: Metabolic: Goal: Ability to maintain appropriate glucose levels will improve Outcome: Progressing   Problem: Nutritional: Goal: Maintenance of adequate nutrition will improve Outcome: Progressing Goal: Progress toward achieving an optimal weight will improve Outcome: Progressing   Problem: Skin Integrity: Goal: Risk for impaired skin integrity will decrease Outcome: Progressing   Problem: Tissue Perfusion: Goal: Adequacy of tissue perfusion will improve Outcome: Progressing   Problem: Education: Goal: Knowledge of General Education information will improve Description: Including pain rating scale, medication(s)/side effects and non-pharmacologic comfort measures Outcome: Progressing   Problem: Health Behavior/Discharge Planning: Goal: Ability to manage health-related needs will improve Outcome: Progressing   Problem: Clinical Measurements: Goal: Ability to maintain clinical measurements within normal limits will improve Outcome: Progressing Goal: Will remain free from infection Outcome: Progressing Goal: Diagnostic test results will improve Outcome: Progressing Goal: Respiratory complications will improve Outcome: Progressing Goal: Cardiovascular complication will  be avoided Outcome: Progressing   Problem: Activity: Goal: Risk for activity intolerance will decrease Outcome: Progressing   Problem: Nutrition: Goal: Adequate nutrition will be maintained Outcome: Progressing   Problem: Coping: Goal: Level of anxiety will decrease Outcome: Progressing   Problem: Elimination: Goal: Will not experience complications related to bowel motility Outcome: Progressing Goal: Will not experience complications related to urinary retention Outcome: Progressing   Problem: Pain Management: Goal: General experience of comfort will improve Outcome: Progressing   Problem: Safety: Goal: Ability to remain free from injury will improve Outcome: Progressing   Problem: Skin Integrity: Goal: Risk for impaired skin integrity will decrease Outcome: Progressing   Problem: Education: Goal: Knowledge of disease and its progression will improve Outcome: Progressing Goal: Individualized Educational Video(s) Outcome: Progressing   Problem: Fluid Volume: Goal: Compliance with measures to maintain balanced fluid volume will improve Outcome: Progressing   Problem: Health Behavior/Discharge Planning: Goal: Ability to manage health-related needs will improve Outcome: Progressing   Problem: Nutritional: Goal: Ability to make healthy dietary choices will improve Outcome: Progressing   Problem: Clinical Measurements: Goal: Complications related to the disease process, condition or treatment will be avoided or minimized Outcome: Progressing   Problem: Education: Goal: Knowledge of disease or condition will improve Outcome: Progressing Goal: Understanding of medication regimen will improve Outcome: Progressing Goal: Individualized Educational Video(s) Outcome: Progressing   Problem: Activity: Goal: Ability to tolerate increased activity will improve Outcome: Progressing   Problem: Cardiac: Goal: Ability to achieve and maintain adequate cardiopulmonary  perfusion will improve Outcome: Progressing   Problem: Health Behavior/Discharge Planning: Goal: Ability to safely manage health-related needs after discharge will improve Outcome: Progressing

## 2023-07-28 NOTE — Progress Notes (Signed)
 PROGRESS NOTE        PATIENT DETAILS Name: [name] [name] Age: 73 y.o. Sex: female Date of Birth: [birth year] Admit Date: 07/24/2023 Admitting Physician Dorn Dawson, MD ERE:Yjhozm, Vernell, MD  Brief Summary: Patient is a 74 y.o.  female with history of ESRD on HD M/T/TH/F, DM-1 on insulin  pump, CAD s/p CABG x 2, s/p bioprosthetic AVR, A-fib on Eliquis , s/p PPM in place, RCC-s/p right nephrectomy, remote history of lymphoma-presented to [facility] on 1/1 with weakness/cough-found to have encephalopathy in the setting of influenza infection.  Significant events: 1/1>> admit to hospitalist service at [facility] 1/3>> worsening hypoxemia after vomiting-possible aspiration event 1/3>> transferred to [facility]  Significant studies: 1/1>> CXR: Loculated-moderate right-sided pleural effusion.  Diffuse hazy airspace disease right lung. 1/2>> CT head: No acute intracranial abnormality  Significant microbiology data: 1/1>> influenza A PCR: Positive 1/1>> COVID/influenza B/RSV PCR: Negative 1/2>> COVID PCR: Negative 1/4>> pleural fluid culture: No growth  Procedures: 1/4>> thoracocentesis by IR  Consults: None  Subjective: Had significant coughing spells last night.  Brief episode of epistaxis that resolved with Afrin nasal spray.  Objective: Vitals: Blood pressure (!) 124/46, pulse 89, temperature 97.9 F (36.6 C), temperature source Oral, resp. rate (!) 21, height 5' 2 (1.575 m), weight 47.2 kg, SpO2 93%.   Exam: Gen Exam:Alert awake-not in any distress HEENT:atraumatic, normocephalic Chest: B/L clear to auscultation anteriorly CVS:S1S2 regular Abdomen:soft non tender, non distended Extremities:no edema Neurology: Non focal Skin: no rash  Pertinent Labs/Radiology:    Latest Ref Rng & Units 07/28/2023    5:48 AM 07/27/2023   11:33 PM 07/27/2023    5:18 AM  CBC  WBC 4.0 - 10.5 K/uL 5.6  5.3  5.0   Hemoglobin 12.0 - 15.0 g/dL 88.3  88.2  88.6   Hematocrit 36.0 -  46.0 % 34.1  35.2  34.6   Platelets 150 - 400 K/uL 94  101  82     Lab Results  Component Value Date   NA 127 (L) 07/28/2023   K 4.3 07/28/2023   CL 90 (L) 07/28/2023   CO2 23 07/28/2023      Assessment/Plan: Acute metabolic encephalopathy Secondary to influenza infection Resolved with supportive care  Acute hypoxic respiratory failure secondary to influenza A PNA-possible aspiration PNA Hypoxia has improved She is either on room air or on minimal amount of oxygen. She previously has been intolerant to Tamiflu (per pt-has had nausea/vomiting on prior 2 attempts) Continue levofloxacin -as she seems to be clinically improving.  Sepsis secondary to influenza/PNA Improved Cultures as above On Levaquin   Loculated right-sided pleural effusion This does not appear to be a new finding-was present in her most recent PET scan done in October 2024 Underwent thoracocentesis 1/4-appears to be exudate-cultures negative so far-await cytology  Will discuss with PCCM in the next day or so  PAF with RVR Triggered by PNA/influenza Maintaining sinus rhythm Continue metoprolol /Eliquis  Telemetry monitoring  Nausea/vomiting-aspiration event on 1/3 Following a coughing spell No further nausea/vomiting  Episode of epistaxis on 1/4 Resolved with Afrin-no epistaxis this morning.  ESRD on HD M/T/T/F Nephrology following  Chronic HFpEF Euvolemic Volume removal with HD  Hyponatremia Should improve with further HD Follow periodically Fluid restriction  Sick sinus syndrome-s/p PPM implantation Telemetry monitoring  CAD s/p CABG x 2 (2005/2011) and bioprosthetic AVR 2011 ASA/metoprolol  Resume Repatha  injections postdischarge  HTN BP  stable on metoprolol  Resume Imdur  when BP is a bit more stable.  DM-1 (A1c 6.7 on 1/1) No further episodes of hypoglycemia Semglee  25 units + SSI  Will ask diabetic coordinator to place back on insulin  pump-on 1/6.    Recent Labs    07/27/23 2144  07/28/23 0314 07/28/23 0842  GLUCAP 166* 155* 96   GERD Pepcid   Mood disorder Stable Xanax /Lexapro   Restless leg/peripheral neuropathy Continue Neurontin   History of Hodgkin's lymphoma S/p mantle radiation 1981-in remission since then Follows with oncology  Renal cell carcinoma-s/p right nephrectomy 2008 Follows with oncology  Left breast DCIS-s/p left mastectomy 2016 Follows with oncology  Debility/deconditioning PT/OT eval ordered  OSA BiPAP  Underweight: Estimated body mass index is 19.03 kg/m as calculated from the following:   Height as of this encounter: 5' 2 (1.575 m).   Weight as of this encounter: 47.2 kg.   Code status:   Code Status: Full Code   DVT Prophylaxis: apixaban  (ELIQUIS ) tablet 5 mg    Family Communication: Daughter at bedside   Disposition Plan: Status is: Inpatient Remains inpatient appropriate because: Severity of illness   Planned Discharge Destination:Home   Diet: Diet Order             Diet Carb Modified Fluid consistency: Thin; Room service appropriate? Yes  Diet effective now                     Antimicrobial agents: Anti-infectives (From admission, onward)    Start     Dose/Rate Route Frequency Ordered Stop   07/27/23 1045  fluconazole  (DIFLUCAN ) tablet 150 mg        150 mg Oral  Once 07/27/23 0951 07/27/23 1740   07/27/23 0800  levofloxacin  (LEVAQUIN ) tablet 500 mg        500 mg Oral Every 48 hours 07/25/23 0329 07/31/23 0759   07/25/23 0330  levofloxacin  (LEVAQUIN ) tablet 750 mg        750 mg Oral  Once 07/25/23 0329 07/25/23 0400        MEDICATIONS: Scheduled Meds:  apixaban   5 mg Oral BID   aspirin  EC  81 mg Oral Daily   benzonatate   200 mg Oral TID   Chlorhexidine  Gluconate Cloth  6 each Topical Q0600   dextromethorphan   30 mg Oral QHS   escitalopram   5 mg Oral QHS   famotidine   10 mg Oral QHS   fluticasone   2 spray Each Nare Daily   gabapentin   100 mg Oral Daily   insulin  glargine-yfgn   25 Units Subcutaneous Daily   insulin  glulisine  0-6 Units Subcutaneous TID with meals   lactulose   20 g Oral BID   levofloxacin   500 mg Oral Q48H   metoprolol  succinate  12.5 mg Oral Daily   midodrine   5 mg Oral Once per day on Sunday Tuesday Thursday Saturday   sodium chloride  flush  3 mL Intravenous Q12H   Continuous Infusions: PRN Meds:.acetaminophen , ALPRAZolam , alteplase , heparin , levalbuterol , lidocaine  (PF), lidocaine -prilocaine , ondansetron  (ZOFRAN ) IV, mouth rinse, pentafluoroprop-tetrafluoroeth, sodium chloride    I have personally reviewed following labs and imaging studies  LABORATORY DATA: CBC: Recent Labs  Lab 07/24/23 2253 07/26/23 0343 07/27/23 0518 07/27/23 2333 07/28/23 0548  WBC 8.5 6.8 5.0 5.3 5.6  NEUTROABS  --   --  3.5  --  3.9  HGB 11.5* 10.8* 11.3* 11.7* 11.6*  HCT 36.2 33.6* 34.6* 35.2* 34.1*  MCV 98.1 97.1 95.3 94.4 92.7  PLT 90* 77* 82* 101*  94*    Basic Metabolic Panel: Recent Labs  Lab 07/24/23 2253 07/26/23 0343 07/27/23 0518 07/28/23 0548  NA 131* 129* 131* 127*  K 4.0 4.3 4.0 4.3  CL 94* 92* 92* 90*  CO2 26 23 25 23   GLUCOSE 133* 151* 140* 129*  BUN 35* 52* 40* 56*  CREATININE 3.58* 4.09* 3.86* 4.10*  CALCIUM  10.0 9.2 9.7 9.5  PHOS  --  5.9* 5.5* 4.5    GFR: Estimated Creatinine Clearance: 9.5 mL/min (A) (by C-G formula based on SCr of 4.1 mg/dL (H)).  Liver Function Tests: Recent Labs  Lab 07/26/23 0343 07/27/23 0518 07/28/23 0548  AST  --  30  --   ALT  --  12  --   ALKPHOS  --  180*  --   BILITOT  --  1.1  --   PROT  --  6.9  --   ALBUMIN  3.6 3.4* 3.2*   No results for input(s): LIPASE, AMYLASE in the last 168 hours. Recent Labs  Lab 07/25/23 0036  AMMONIA 43*    Coagulation Profile: Recent Labs  Lab 07/24/23 2253 07/27/23 2333  INR 2.3* 2.4*    Cardiac Enzymes: No results for input(s): CKTOTAL, CKMB, CKMBINDEX, TROPONINI in the last 168 hours.  BNP (last 3 results) No results for  input(s): PROBNP in the last 8760 hours.  Lipid Profile: No results for input(s): CHOL, HDL, LDLCALC, TRIG, CHOLHDL, LDLDIRECT in the last 72 hours.  Thyroid  Function Tests: No results for input(s): TSH, T4TOTAL, FREET4, T3FREE, THYROIDAB in the last 72 hours.   Anemia Panel: No results for input(s): VITAMINB12, FOLATE, FERRITIN, TIBC, IRON , RETICCTPCT in the last 72 hours.   Urine analysis:    Component Value Date/Time   COLORURINE YELLOW 04/17/2022 1514   APPEARANCEUR CLEAR 04/17/2022 1514   LABSPEC <1.005 (L) 04/17/2022 1514   LABSPEC 1.010 10/17/2015 0808   PHURINE 6.5 04/17/2022 1514   GLUCOSEU NEGATIVE 04/17/2022 1514   GLUCOSEU 500 10/17/2015 0808   HGBUR NEGATIVE 04/17/2022 1514   BILIRUBINUR NEGATIVE 04/17/2022 1514   BILIRUBINUR Negative 10/17/2015 0808   KETONESUR NEGATIVE 04/17/2022 1514   PROTEINUR NEGATIVE 04/17/2022 1514   UROBILINOGEN 0.2 10/17/2015 0808   NITRITE NEGATIVE 04/17/2022 1514   LEUKOCYTESUR NEGATIVE 04/17/2022 1514   LEUKOCYTESUR Negative 10/17/2015 0808    Sepsis Labs: Lactic Acid, Venous    Component Value Date/Time   LATICACIDVEN 1.7 07/24/2023 2253    MICROBIOLOGY: Recent Results (from the past 240 hours)  Resp panel by RT-PCR (RSV, Flu A&B, Covid) Anterior Nasal Swab     Status: Abnormal   Collection Time: 07/24/23  9:55 PM   Specimen: Anterior Nasal Swab  Result Value Ref Range Status   SARS Coronavirus 2 by RT PCR NEGATIVE NEGATIVE Final    Comment: (NOTE) SARS-CoV-2 target nucleic acids are NOT DETECTED.  The SARS-CoV-2 RNA is generally detectable in upper respiratory specimens during the acute phase of infection. The lowest concentration of SARS-CoV-2 viral copies this assay can detect is 138 copies/mL. A negative result does not preclude SARS-Cov-2 infection and should not be used as the sole basis for treatment or other patient management decisions. A negative result may occur with   improper specimen collection/handling, submission of specimen other than nasopharyngeal swab, presence of viral mutation(s) within the areas targeted by this assay, and inadequate number of viral copies(<138 copies/mL). A negative result must be combined with clinical observations, patient history, and epidemiological information. The expected result is Negative.  Fact Sheet for  Patients:  bloggercourse.com  Fact Sheet for Healthcare Providers:  seriousbroker.it  This test is no t yet approved or cleared by the [place]  FDA and  has been authorized for detection and/or diagnosis of SARS-CoV-2 by FDA under an Emergency Use Authorization (EUA). This EUA will remain  in effect (meaning this test can be used) for the duration of the COVID-19 declaration under Section 564(b)(1) of the Act, 21 U.S.C.section 360bbb-3(b)(1), unless the authorization is terminated  or revoked sooner.       Influenza A by PCR POSITIVE (A) NEGATIVE Final   Influenza B by PCR NEGATIVE NEGATIVE Final    Comment: (NOTE) The Xpert Xpress SARS-CoV-2/FLU/RSV plus assay is intended as an aid in the diagnosis of influenza from Nasopharyngeal swab specimens and should not be used as a sole basis for treatment. Nasal washings and aspirates are unacceptable for Xpert Xpress SARS-CoV-2/FLU/RSV testing.  Fact Sheet for Patients: bloggercourse.com  Fact Sheet for Healthcare Providers: seriousbroker.it  This test is not yet approved or cleared by the [place]  FDA and has been authorized for detection and/or diagnosis of SARS-CoV-2 by FDA under an Emergency Use Authorization (EUA). This EUA will remain in effect (meaning this test can be used) for the duration of the COVID-19 declaration under Section 564(b)(1) of the Act, 21 U.S.C. section 360bbb-3(b)(1), unless the authorization is terminated  or revoked.     Resp Syncytial Virus by PCR NEGATIVE NEGATIVE Final    Comment: (NOTE) Fact Sheet for Patients: bloggercourse.com  Fact Sheet for Healthcare Providers: seriousbroker.it  This test is not yet approved or cleared by the [place]  FDA and has been authorized for detection and/or diagnosis of SARS-CoV-2 by FDA under an Emergency Use Authorization (EUA). This EUA will remain in effect (meaning this test can be used) for the duration of the COVID-19 declaration under Section 564(b)(1) of the Act, 21 U.S.C. section 360bbb-3(b)(1), unless the authorization is terminated or revoked.  Performed at [facility], [address] [place], [place] [address]   Blood Culture (routine x 2)     Status: None (Preliminary result)   Collection Time: 07/25/23 12:36 AM   Specimen: BLOOD  Result Value Ref Range Status   Specimen Description BLOOD BLOOD LEFT ARM  Final   Special Requests   Final    BOTTLES DRAWN AEROBIC AND ANAEROBIC Blood Culture adequate volume   Culture   Final    NO GROWTH 3 DAYS Performed at [facility], [address] [place], [place] [address]    Report Status PENDING  Incomplete  Blood Culture (routine x 2)     Status: None (Preliminary result)   Collection Time: 07/25/23 12:36 AM   Specimen: BLOOD  Result Value Ref Range Status   Specimen Description BLOOD BLOOD LEFT HAND  Final   Special Requests   Final    BOTTLES DRAWN AEROBIC AND ANAEROBIC Blood Culture adequate volume   Culture   Final    NO GROWTH 3 DAYS Performed at [facility] [address], [place], [place] [address]    Report Status PENDING  Incomplete  Culture, body fluid w Gram Stain-bottle     Status: None (Preliminary result)   Collection Time: 07/27/23 12:12 PM   Specimen: Pleura  Result Value Ref Range Status   Specimen Description PLEURAL  Final   Special Requests NONE  Final   Culture   Final    NO GROWTH < 24  HOURS Performed at [facility], [address]  [place], [place] [address]    Report Status PENDING  Incomplete  Gram stain     Status: None   Collection Time: 07/27/23 12:12 PM   Specimen: Pleura  Result Value Ref Range Status   Specimen Description PLEURAL  Final   Special Requests NONE  Final   Gram Stain   Final    NO WBC SEEN NO ORGANISMS SEEN Performed at [facility], [address] [place], [place] [address]    Report Status 07/27/2023 FINAL  Final    RADIOLOGY STUDIES/RESULTS: US  THORACENTESIS ASP PLEURAL SPACE W/IMG GUIDE Result Date: 07/27/2023 INDICATION: 73 year old female with cough, shortness of breath, loculated right pleural effusion. EXAM: ULTRASOUND GUIDED RIGHT THORACENTESIS MEDICATIONS: 10 mL 1% lidocaine  COMPLICATIONS: None immediate. PROCEDURE: An ultrasound guided thoracentesis was thoroughly discussed with the patient and questions answered. The benefits, risks, alternatives and complications were also discussed. The patient understands and wishes to proceed with the procedure. Written consent was obtained. Ultrasound was performed to localize and mark an adequate pocket of fluid in the right chest. The area was then prepped and draped in the normal sterile fashion. 1% Lidocaine  was used for local anesthesia. Under ultrasound guidance a 6 Fr Safe-T-Centesis catheter was introduced. Thoracentesis was performed. The catheter was removed and a dressing applied. FINDINGS: A total of approximately 400 mL of amber fluid was removed. Samples were sent to the laboratory as requested by the clinical team. IMPRESSION: Successful ultrasound guided right thoracentesis yielding 400 mL of pleural fluid. Performed by: Kacie Matthews PA-C Electronically Signed   By: Ester Sides M.D.   On: 07/27/2023 16:13   DG Chest 1 View Result Date: 07/27/2023 CLINICAL DATA:  Pleural effusion EXAM: CHEST  1 VIEW COMPARISON:  X-ray 07/27/2023 earlier FINDINGS: Decreasing right pleural  effusion with significant residual post thoracentesis. No pneumothorax. Persistent patchy right lung opacities. Stable enlarged cardiopericardial silhouette with sternal wires. Right IJ double-lumen catheter in place once again with left upper chest pacemaker. Vascular congestion interstitial changes. No left-sided pleural effusion. IMPRESSION: Decreasing right pleural effusion with significant residual. No pneumothorax post thoracentesis. Electronically Signed   By: Ranell Bring M.D.   On: 07/27/2023 12:30   DG Chest Port 1V same Day Result Date: 07/27/2023 CLINICAL DATA:  Shortness of breath. EXAM: PORTABLE CHEST 1 VIEW COMPARISON:  Chest radiograph dated 07/26/2023. FINDINGS: The heart size and mediastinal contours are within normal limits. Vascular calcifications are seen in the aortic arch. There is a moderate to large right pleural effusion with associated atelectasis/airspace disease. There is mild bilateral interstitial opacities. No pneumothorax. A right internal jugular central venous catheter tip overlies the right atrium. A left subclavian approach cardiac device is redemonstrated. IMPRESSION: 1. Moderate to large right pleural effusion with associated atelectasis/airspace disease. 2. Mild bilateral interstitial opacities may reflect pulmonary edema. Electronically Signed   By: Norman Hopper M.D.   On: 07/27/2023 11:09     LOS: 3 days   Donalda Applebaum, MD  [facility]    To contact the attending provider between 7A-7P or the covering provider during after hours 7P-7A, please log into the web site [email or web address] and access using universal [place] password for that web site. If you do not have the password, please call the hospital operator.  07/28/2023, 11:11 AM

## 2023-07-28 NOTE — Progress Notes (Signed)
 Patient declined the use of home CPAP for the night.

## 2023-07-28 NOTE — Inpatient Diabetes Management (Signed)
 Inpatient Diabetes Program Recommendations  AACE/ADA: New Consensus Statement on Inpatient Glycemic Control (2015)  Target Ranges:  Prepandial:   less than 140 mg/dL      Peak postprandial:   less than 180 mg/dL (1-2 hours)      Critically ill patients:  140 - 180 mg/dL    Latest Reference Range & Units 07/27/23 00:37 07/27/23 04:15 07/27/23 04:56 07/27/23 07:43 07/27/23 13:12 07/27/23 16:53 07/27/23 20:10  Glucose-Capillary 70 - 99 mg/dL 97 74 899 (H) 867 (H) 96 125 (H) 185 (H)  (H): Data is abnormally high  Latest Reference Range & Units 07/27/23 21:44 07/28/23 03:14 07/28/23 08:42  Glucose-Capillary 70 - 99 mg/dL 833 (H) 844 (H) 96  (H): Data is abnormally high      History: Type 1 diabetes  Home DM Meds: Medtronic insulin  pump 780G with Apidria; Lantus  38 units daily when not using pump   Current Orders: Semglee  25 units daily      Apidra  Insulin  0-6 units TID per SSI    MD- Please be aware that pt has refused her dose of Semglee  basal insulin  the last 2 days (01/04 and this AM 01/05)     --Will follow patient during hospitalization--  Adina Rudolpho Arrow RN, MSN, CDCES Diabetes Coordinator Inpatient Glycemic Control Team Team Pager: [phone] (8a-5p)

## 2023-07-29 ENCOUNTER — Encounter (HOSPITAL_COMMUNITY): Payer: Self-pay | Admitting: Internal Medicine

## 2023-07-29 ENCOUNTER — Inpatient Hospital Stay (HOSPITAL_COMMUNITY): Payer: Medicare PPO

## 2023-07-29 DIAGNOSIS — J101 Influenza due to other identified influenza virus with other respiratory manifestations: Secondary | ICD-10-CM | POA: Diagnosis not present

## 2023-07-29 DIAGNOSIS — R531 Weakness: Secondary | ICD-10-CM | POA: Diagnosis not present

## 2023-07-29 DIAGNOSIS — J90 Pleural effusion, not elsewhere classified: Secondary | ICD-10-CM | POA: Diagnosis not present

## 2023-07-29 DIAGNOSIS — R410 Disorientation, unspecified: Secondary | ICD-10-CM | POA: Diagnosis not present

## 2023-07-29 LAB — CBC WITH DIFFERENTIAL/PLATELET
Abs Immature Granulocytes: 0.02 10*3/uL (ref 0.00–0.07)
Basophils Absolute: 0 10*3/uL (ref 0.0–0.1)
Basophils Relative: 1 %
Eosinophils Absolute: 0 10*3/uL (ref 0.0–0.5)
Eosinophils Relative: 0 %
HCT: 33.3 % — ABNORMAL LOW (ref 36.0–46.0)
Hemoglobin: 11.3 g/dL — ABNORMAL LOW (ref 12.0–15.0)
Immature Granulocytes: 0 %
Lymphocytes Relative: 16 %
Lymphs Abs: 0.9 10*3/uL (ref 0.7–4.0)
MCH: 31.1 pg (ref 26.0–34.0)
MCHC: 33.9 g/dL (ref 30.0–36.0)
MCV: 91.7 fL (ref 80.0–100.0)
Monocytes Absolute: 0.8 10*3/uL (ref 0.1–1.0)
Monocytes Relative: 14 %
Neutro Abs: 4.1 10*3/uL (ref 1.7–7.7)
Neutrophils Relative %: 69 %
Platelets: 99 10*3/uL — ABNORMAL LOW (ref 150–400)
RBC: 3.63 MIL/uL — ABNORMAL LOW (ref 3.87–5.11)
RDW: 14.4 % (ref 11.5–15.5)
WBC: 5.9 10*3/uL (ref 4.0–10.5)
nRBC: 0 % (ref 0.0–0.2)

## 2023-07-29 LAB — RENAL FUNCTION PANEL
Albumin: 3.2 g/dL — ABNORMAL LOW (ref 3.5–5.0)
Anion gap: 14 (ref 5–15)
BUN: 68 mg/dL — ABNORMAL HIGH (ref 8–23)
CO2: 24 mmol/L (ref 22–32)
Calcium: 9.3 mg/dL (ref 8.9–10.3)
Chloride: 90 mmol/L — ABNORMAL LOW (ref 98–111)
Creatinine, Ser: 3.73 mg/dL — ABNORMAL HIGH (ref 0.44–1.00)
GFR, Estimated: 12 mL/min — ABNORMAL LOW (ref >60)
Glucose, Bld: 172 mg/dL — ABNORMAL HIGH (ref 70–99)
Phosphorus: 4.1 mg/dL (ref 2.5–4.6)
Potassium: 4 mmol/L (ref 3.5–5.1)
Sodium: 128 mmol/L — ABNORMAL LOW (ref 135–145)

## 2023-07-29 LAB — GLUCOSE, CAPILLARY
Glucose-Capillary: 105 mg/dL — ABNORMAL HIGH (ref 70–99)
Glucose-Capillary: 180 mg/dL — ABNORMAL HIGH (ref 70–99)
Glucose-Capillary: 168 mg/dL — ABNORMAL HIGH (ref 70–99)

## 2023-07-29 MED ORDER — INSULIN PUMP: Freq: Three times a day (TID) | SUBCUTANEOUS | Status: DC

## 2023-07-29 MED ORDER — PROSOURCE PLUS PO LIQD
30.0000 mL | Freq: Two times a day (BID) | ORAL | Status: DC
  Administered 2023-07-30 – 2023-08-01 (×2): 30 mL via ORAL
  Filled 2023-07-29 (×3): qty 30

## 2023-07-29 MED ORDER — INSULIN GLARGINE-YFGN 100 UNIT/ML ~~LOC~~ SOLN
6.0000 [IU] | Freq: Every day | SUBCUTANEOUS | Status: DC
  Administered 2023-07-29 – 2023-08-01 (×4): 6 [IU] via SUBCUTANEOUS
  Filled 2023-07-29 (×4): qty 0.06

## 2023-07-29 NOTE — Inpatient Diabetes Management (Signed)
 Inpatient Diabetes Program Recommendations  AACE/ADA: New Consensus Statement on Inpatient Glycemic Control (2015)  Target Ranges:  Prepandial:   less than 140 mg/dL      Peak postprandial:   less than 180 mg/dL (1-2 hours)      Critically ill patients:  140 - 180 mg/dL   Lab Results  Component Value Date   GLUCAP 105 (H) 07/29/2023   HGBA1C 6.7 (H) 07/24/2023    Review of Glycemic Control  Latest Reference Range & Units 07/28/23 12:14 07/28/23 16:14 07/28/23 21:33 07/29/23 10:23  Glucose-Capillary 70 - 99 mg/dL 860 (H) 811 (H) 833 (H) 105 (H)  (H): Data is abnormally high Diabetes history: DM1 (does NOT make any insulin ; requires basal, correction, and carb coverage insulin ) Outpatient Diabetes medications: Medtronic insulin  pump 780G with Apidria; Lantus  38 units daily when not using pump Current orders for Inpatient glycemic control: Semglee  25 units at bedtime, Apidra  0-6 units TID  Inpatient Diabetes Program Recommendations:    Patient has not received basal insulin  since 1/3. Confirmed patient was not self injecting with insulin  from home. Verified with RN. Additionally, confirmed patient not wearing insulin  pump.   Spoke with patient and daughter regarding insulin  pump. Patient does have insulin  pump and necessary supplies. Reviewed current glucose trends, current insulin  needs, insulin  received over last 48 hours and noted patient refusal of basal insulin .  Verified patient was not comfortable with current dose due to possibility having lows and reviewed pump settings per outpatient endocrinologist.  Reviewed insulin  pump settings which are: Basal 12A     0.925 units/hr 5A       2.25 units/hr 7A       2.8 units/hr 1:30P  4.0 units/hr  7P       5.0 units/hr 9P       3.5 units/hr Total Basal: 70.75 units   Insulin  to Carb Ratio 12A  1:4 grams 11A   1:2.8 grams 4P     1:2 grams 9P     1:3.5 grams   Insulin  Sensitivity 12A 1:15 mg/dl 7A   1:5 mg/dl  Given current  insulin  needs do not feel insulin  pump application is safe plan at this time compared to insulin  pump rates. Patient in agreement.   Encouraged to make appointment for post hospital follow up with outpatient endocrinology. Patient and family are open to SQ injections until insulin  needs stabilize. Reviewed impacts of ESRD and infection on glucose metabolism.   At this time, consider reducing Semglee  to 6 units every day. Patient in agreement and willing to take.   Secure chat sent to MD. Orders adjusted accordingly. No further questions at this time.   Thanks, Tinnie Minus, MSN, RNC-OB Diabetes Coordinator [phone] (8a-5p)

## 2023-07-29 NOTE — Progress Notes (Signed)
 Pt receives home HD training through [facility] home therapy dept. Will assist as needed.   Olivia Canter Renal Navigator [phone]

## 2023-07-29 NOTE — Consult Note (Signed)
 NAME:  [name] [name], MRN:  [id number], DOB:  [birth year], LOS: 4 ADMISSION DATE:  07/24/2023, CONSULTATION DATE:  07/28/22 REFERRING MD:  Dr. Raenelle, CHIEF COMPLAINT:  SOB   History of Present Illness:   32 yoF with PMH significant for but not limited to never smoker, OSA on CPAP, ESRD on iHD, type 1 DM, Afib on Eliquis , CAD s/p CABG, s/p bioprosthetic AVR, sick sinus s/p PPM, Hodgkins lymphoma in remission since 1981, renal cell carcinoma s/p R nephrectomy 2008, and left breast DCIS s/p left mastectomy 2016 in which pulmonary is consulted for right pleural effusion.  Admitted to [facility] on 1/1 with weakness and cough admitted for encephalopathy found to be flu A positive.  Developed worsening hypoxia on 1/3 after having emesis after coughing spell.  Underwent IR thoracentesis 1/4 with IR with of amber fluid removed.  Exudate by lights criteria, culture no growth to date and cytology pending.  Continued O2 requirement on 1-2 L [place].  PCCM consulted for further recommendations on pleural effusion.    In EMR review, pt has hx of stable right pulmonary nodule on imaging since 2018 and CT chest 04/2022 shows small chronic loculated pleural effusion.  Pt remains sleepy from xanax  overnight but daughter at bedside reports pt with cough dating back to Thanksgiving, was treated then with levaquin  for URI.  Cough turned productive on 1/1 initially yellow since now pink/brown.  Denies previous hematemesis or hemoptysis but since having a nose bleed 1/4, secretions have been brownish.  No reported wt loss.  Pt does endorse intermittent dysphagia at times.  Daughter reports functional decline since starting dialysis in October.  Missed one dialysis treatment prior to admit.   Pertinent  Medical History   Past Medical History:  Diagnosis Date   Anxiety    Aortic stenosis    a. bicuspid aortic valve; mean gradient of 20 mmHg in 2/10; b. s/p bioprosth AVR (19-mm Edwards pericardial valve) with a redo coronary  artery bypass graft procedure in 07/2009; postoperative Dressler's syndrome     Arthritis    Breast cancer (HCC)    Carcinoma, renal cell (HCC) 05/2007   Laparoscopic right nephrectomy   Chronic diastolic CHF (congestive heart failure) (HCC)    a. 9.2014 EchoP EF 60-65%, no rwma, bioprosth AVR, mean gradient of , mild to mod MR, PASP .   CKD (chronic kidney disease), stage IV (HCC)    Colitis, ischemic (HCC) 2008   Complication of anesthesia    Coronary artery disease    a. initial CABG with SVG-RCA in 12/05. b. redo SVG-RCA and bioprosthetic AVR in 1/11. d. 9/14 - PCI with DES to distal LM/proximal LAD was done rather than bypass as she would have been a poor candidate for redo sternotomy. d. S/p DES to prox LM 05/2014.   Diabetes mellitus 03/2010    TYPE II: Hemoglobin A1c of 7.4 in  03/2010; 8.4 in 06/2010; treated with insulin  pump   Duodenal ulcer    Remote; H. pylori positive   Dyspnea    Gastroparesis due to DM (HCC) 05/01/2012   Glaucoma 1998   Hodgkin's disease (HCC) 1991   Mantle radiation therapy   Hyperlipidemia    Hyperplastic gastric polyp 06/23/2012   Hypertension    Iron  deficiency anemia    a. 03/2013 EGD: essentially normal. Possible slow GIB.   Migraines    NASH (nonalcoholic steatohepatitis) 1999   -biopsy in 1999   OSA treated with BiPAP    Osteoporosis  Pacemaker    six sinus syndrome   PAF (paroxysmal atrial fibrillation) (HCC)    a.  h/o brief episodes noted on ppm interrogation. b. given GI bleeding and need to be on both ASA 81 and Brilinta , risks likely outweigh benefits of anticoagulation.    PONV (postoperative nausea and vomiting)    Small bowel obstruction (HCC)    Recurrent; resolved after resection of a lipoma   Syncope    a. H/o recurrent syncope with pauses on loop recorder s/p Medtronic pacemaker 07/2012.   Significant Hospital Events: Including procedures, antibiotic start and stop dates in addition to other pertinent events      Interim History / Subjective:  Has been poorly sleeping, better after receiving xanax  overnight.  Ongoing intermittent coughing spells.  No nausea  Objective   Blood pressure (!) 116/52, pulse 74, temperature 97.6 F (36.4 C), temperature source Oral, resp. rate 16, height 5' 2 (1.575 m), weight 51.4 kg, SpO2 96%.        Intake/Output Summary (Last 24 hours) at 07/29/2023 1109 Last data filed at 07/28/2023 2057 Gross per 24 hour  Intake 3 ml  Output --  Net 3 ml   Filed Weights   07/27/23 0500 07/28/23 0417 07/29/23 0500  Weight: 51.2 kg 47.2 kg 51.4 kg    Examination: General:  Older female lying in bed in NAD HEENT: MM pink/dry Neuro: Sleeping initially but arouses to verbal, oriented x 3, MAE CV: rr, NSR PULM:  non labored, scattered rhonchi bilaterally, slightly diminished right base, few bibasilar rales, congested cough GI: soft, bs+, NT Extremities: warm/dry, no tibial edema  Skin: no rashes   Remains afebrile since 1/3, WBC 5.9  Resolved Hospital Problem list    Assessment & Plan:   Acute on chronic right loculated effusion Flu A positive- previously intolerant to tamiflu Hypoxic respiratory failure R/o aspiration PNA vs pneumonitis  ESRD on iHD HFpEF - hx of pulmonary nodules previously stable on imaging - loculation seen back on 04/2022 imaging but increased in volume since compared to 04/2023 imaging - s/p IR 1/4, exudate by Lights criteria, follow cultures and cytology - bedside US  shows residual right effusion.  Will obtain CXR post iHD today for further assessment.  Consider f/u CT possibly pending CXR, will discuss with attending - cont supplemental O2 for sat goal > 92%.  Will need ambulatory saturations prior to discharge - abx per primary- levaquin , remains afebrile, WBC wnl, normal differential, consider monitoring clinically - consider SLP given intermittent dysphagia.  Emesis episodes appear to be post-tussive - volume removal per iHD - prn  BD, on PPI, and continue tessalon / delsym  prn - ongoing aggressive pulmonary hygiene- IS, mobilize - deconditioning and functional decline since starting iHD in October, also likely contributing   Remainder per primary.  Best Practice (right click and Reselect all SmartList Selections daily)   Diet/type: Regular consistency (see orders) DVT prophylaxis DOAC Pressure ulcer(s): N/A GI prophylaxis: H2B Lines: Dialysis Catheter- R TDC Foley:  N/A Code Status:  full code Last date of multidisciplinary goals of care discussion [per primary]  Daughter at bedside, updated   Labs   CBC: Recent Labs  Lab 07/26/23 0343 07/27/23 0518 07/27/23 2333 07/28/23 0548 07/29/23 0444  WBC 6.8 5.0 5.3 5.6 5.9  NEUTROABS  --  3.5  --  3.9 4.1  HGB 10.8* 11.3* 11.7* 11.6* 11.3*  HCT 33.6* 34.6* 35.2* 34.1* 33.3*  MCV 97.1 95.3 94.4 92.7 91.7  PLT 77* 82* 101* 94* 99*  Basic Metabolic Panel: Recent Labs  Lab 07/24/23 2253 07/26/23 0343 07/27/23 0518 07/28/23 0548 07/29/23 0444  NA 131* 129* 131* 127* 128*  K 4.0 4.3 4.0 4.3 4.0  CL 94* 92* 92* 90* 90*  CO2 26 23 25 23 24   GLUCOSE 133* 151* 140* 129* 172*  BUN 35* 52* 40* 56* 68*  CREATININE 3.58* 4.09* 3.86* 4.10* 3.73*  CALCIUM  10.0 9.2 9.7 9.5 9.3  PHOS  --  5.9* 5.5* 4.5 4.1   GFR: Estimated Creatinine Clearance: 11.1 mL/min (A) (by C-G formula based on SCr of 3.73 mg/dL (H)). Recent Labs  Lab 07/24/23 2253 07/26/23 0343 07/27/23 0518 07/27/23 2333 07/28/23 0548 07/29/23 0444  WBC 8.5   < > 5.0 5.3 5.6 5.9  LATICACIDVEN 1.7  --   --   --   --   --    < > = values in this interval not displayed.    Liver Function Tests: Recent Labs  Lab 07/26/23 0343 07/27/23 0518 07/28/23 0548 07/29/23 0444  AST  --  30  --   --   ALT  --  12  --   --   ALKPHOS  --  180*  --   --   BILITOT  --  1.1  --   --   PROT  --  6.9  --   --   ALBUMIN  3.6 3.4* 3.2* 3.2*   No results for input(s): LIPASE, AMYLASE in the last  168 hours. Recent Labs  Lab 07/25/23 0036  AMMONIA 43*    ABG    Component Value Date/Time   PHART 7.344 (L) 04/07/2013 1340   PCO2ART 46.9 (H) 04/07/2013 1340   PO2ART 171.0 (H) 04/07/2013 1340   HCO3 28.5 (H) 07/25/2023 0353   TCO2 31 05/01/2023 0745   ACIDBASEDEF 1.0 03/18/2019 1227   ACIDBASEDEF 1.0 03/18/2019 1227   O2SAT 94.3 07/25/2023 0353     Coagulation Profile: Recent Labs  Lab 07/24/23 2253 07/27/23 2333  INR 2.3* 2.4*    Cardiac Enzymes: No results for input(s): CKTOTAL, CKMB, CKMBINDEX, TROPONINI in the last 168 hours.  HbA1C: Hgb A1c MFr Bld  Date/Time Value Ref Range Status  07/24/2023 10:53 PM 6.7 (H) 4.8 - 5.6 % Final    Comment:    (NOTE)         Prediabetes: 5.7 - 6.4         Diabetes: >6.4         Glycemic control for adults with diabetes: <7.0   05/10/2015 11:24 AM 6.9 (H) 4.8 - 5.6 % Final    Comment:    (NOTE)         Pre-diabetes: 5.7 - 6.4         Diabetes: >6.4         Glycemic control for adults with diabetes: <7.0     CBG: Recent Labs  Lab 07/28/23 0842 07/28/23 1214 07/28/23 1614 07/28/23 2133 07/29/23 1023  GLUCAP 96 139* 188* 166* 105*    Review of Systems:   As per primary, otherwise neg  Past Medical History:  She,  has a past medical history of Anxiety, Aortic stenosis, Arthritis, Breast cancer (HCC), Carcinoma, renal cell (HCC) (05/2007), Chronic diastolic CHF (congestive heart failure) (HCC), CKD (chronic kidney disease), stage IV (HCC), Colitis, ischemic (HCC) (2008), Complication of anesthesia, Coronary artery disease, Diabetes mellitus (03/2010), Duodenal ulcer, Dyspnea, Gastroparesis due to DM (HCC) (05/01/2012), Glaucoma (1998), Hodgkin's disease (HCC) (1991), Hyperlipidemia, Hyperplastic gastric polyp (06/23/2012), Hypertension, Iron   deficiency anemia, Migraines, NASH (nonalcoholic steatohepatitis) (1999), OSA treated with BiPAP, Osteoporosis, Pacemaker, PAF (paroxysmal atrial fibrillation) (HCC), PONV  (postoperative nausea and vomiting), Small bowel obstruction (HCC), and Syncope.   Surgical History:   Past Surgical History:  Procedure Laterality Date   ABDOMINAL HYSTERECTOMY  2000   AORTIC VALVE REPLACEMENT  2011   Aortic valve replacement surgery with a 19 mm bioprosthetic device post redo CABG January 2011.   AV FISTULA PLACEMENT Right 05/01/2023   Procedure: RIGHT UPPER ARM BRACHIOBASILIC ARTERIOVENOUS FISTULA CREATION;  Surgeon: Serene Gaile ORN, MD;  Location: [facility] OR;  Service: Vascular;  Laterality: Right;   BONE MARROW BIOPSY     BREAST EXCISIONAL BIOPSY     benign, bilateral   CARDIAC CATHETERIZATION     CERVICAL BIOPSY     COLONOSCOPY     Multiple with adenomatous polyps   COLONOSCOPY  06/23/2012   Procedure: COLONOSCOPY;  Surgeon: Lupita FORBES Commander, MD;  Location: [facility] ENDOSCOPY;  Service: Endoscopy;  Laterality: N/A;   CORONARY ARTERY BYPASS GRAFT  2005, 2011   CABG-most recent SVG to RCA-12/05; RCA occluded with patent graft in 2006; Redo bypass in 07/2009   ESOPHAGOGASTRODUODENOSCOPY     ESOPHAGOGASTRODUODENOSCOPY  06/23/2012   Procedure: ESOPHAGOGASTRODUODENOSCOPY (EGD);  Surgeon: Lupita FORBES Commander, MD;  Location: [facility];  Service: Endoscopy;  Laterality: N/A;   ESOPHAGOGASTRODUODENOSCOPY N/A 04/06/2013   Procedure: ESOPHAGOGASTRODUODENOSCOPY (EGD);  Surgeon: Norleen LOISE Kiang, MD;  Location: [facility] ENDOSCOPY;  Service: Endoscopy;  Laterality: N/A;   EXPLORATORY LAPAROTOMY W/ BOWEL RESECTION     Small bowel resection of lipoma   GIVENS CAPSULE STUDY N/A 04/30/2013   Procedure: GIVENS CAPSULE STUDY;  Surgeon: Lupita FORBES Commander, MD;  Location: [facility] ENDOSCOPY;  Service: Endoscopy;  Laterality: N/A;   INSERTION OF DIALYSIS CATHETER Right 05/01/2023   Procedure: INSERTION OF TUNNELED DIALYSIS CATHETER USING PALINDROME CATHETER 23CM;  Surgeon: Serene Gaile ORN, MD;  Location: [facility] OR;  Service: Vascular;  Laterality: Right;   LEFT AND RIGHT HEART CATHETERIZATION WITH CORONARY ANGIOGRAM N/A  04/07/2013   Procedure: LEFT AND RIGHT HEART CATHETERIZATION WITH CORONARY ANGIOGRAM;  Surgeon: Ezra GORMAN Shuck, MD;  Location: [facility] CATH LAB;  Service: Cardiovascular;  Laterality: N/A;   LEFT ATRIAL APPENDAGE OCCLUSION N/A 02/16/2016   Watchman not placed - nickel allergy   LEFT HEART CATHETERIZATION WITH CORONARY/GRAFT ANGIOGRAM N/A 06/07/2014   Procedure: LEFT HEART CATHETERIZATION WITH EL BILE;  Surgeon: Lonni JONETTA Cash, MD;  Location: [facility] CATH LAB;  Service: Cardiovascular;  Laterality: N/A;   LIVER BIOPSY     LOOP RECORDER IMPLANT N/A 12/10/2011   Procedure: LOOP RECORDER IMPLANT;  Surgeon: Danelle ORN Birmingham, MD;  Location: [facility] CATH LAB;  Service: Cardiovascular;  Laterality: N/A;   MALONEY DILATION  06/23/2012   Procedure: AGAPITO DILATION;  Surgeon: Lupita FORBES Commander, MD;  Location: [facility] ENDOSCOPY;  Service: Endoscopy;;  54 fr   MASTECTOMY Left 05/17/2015   total - no lymph nodes removed per patient   NEPHRECTOMY  2008   Laparoscopic right nephrectomy, renal cell carcinoma   PACEMAKER INSERTION  08/21/2012   Medtronic Adapta L dual-chamber pacemaker   PERCUTANEOUS CORONARY STENT INTERVENTION (PCI-S) N/A 04/09/2013   Procedure: PERCUTANEOUS CORONARY STENT INTERVENTION (PCI-S);  Surgeon: Lonni JONETTA Cash, MD;  Location: [facility] CATH LAB;  Service: Cardiovascular;  Laterality: N/A;   PERMANENT PACEMAKER INSERTION N/A 08/21/2012   Procedure: PERMANENT PACEMAKER INSERTION;  Surgeon: Danelle ORN Birmingham, MD;  Location: [facility] CATH LAB;  Service: Cardiovascular;  Laterality: N/A;  RIGHT HEART CATH N/A 03/18/2019   Procedure: RIGHT HEART CATH;  Surgeon: Rolan Ezra RAMAN, MD;  Location: [facility] INVASIVE CV LAB;  Service: Cardiovascular;  Laterality: N/A;   RIGHT HEART CATH N/A 08/21/2022   Procedure: RIGHT HEART CATH;  Surgeon: Rolan Ezra RAMAN, MD;  Location: [facility] INVASIVE CV LAB;  Service: Cardiovascular;  Laterality: N/A;   RIGHT HEART CATH AND CORONARY/GRAFT ANGIOGRAPHY N/A 04/19/2017    Procedure: RIGHT HEART CATH AND CORONARY/GRAFT ANGIOGRAPHY;  Surgeon: Rolan Ezra RAMAN, MD;  Location: [facility] INVASIVE CV LAB;  Service: Cardiovascular;  Laterality: N/A;   TEE WITHOUT CARDIOVERSION N/A 12/07/2015   Procedure: TRANSESOPHAGEAL ECHOCARDIOGRAM (TEE);  Surgeon: Ezra RAMAN Rolan, MD;  Location: [facility] ENDOSCOPY;  Service: Cardiovascular;  Laterality: N/A;   TOTAL ABDOMINAL HYSTERECTOMY W/ BILATERAL SALPINGOOPHORECTOMY  2000   endometriosis and ovarian cysts   TOTAL MASTECTOMY Left 05/17/2015   Procedure: LEFT TOTAL MASTECTOMY;  Surgeon: Donnice Bury, MD;  Location: [facility] OR;  Service: General;  Laterality: Left;   TUMOR EXCISION  1981   removal of Hodgkins lymphoma     Social History:   reports that she has never smoked. She has never used smokeless tobacco. She reports that she does not drink alcohol and does not use drugs.   Family History:  Her family history includes Anal fissures in her mother; Breast cancer (age of onset: 52) in her sister; Cancer (age of onset: 70) in her sister; Cancer (age of onset: 77) in her maternal grandfather; Diabetes in her father; Glaucoma in her brother; Heart attack in her father, maternal grandmother, and mother; Heart disease in her father and mother; Hypertension in her brother; Lung cancer in her sister; Parkinson's disease in her paternal aunt. There is no history of Colon cancer, Stomach cancer, or Parkinsonism.   Allergies Allergies  Allergen Reactions   Avandia [Rosiglitazone Maleate] Other (See Comments)    Congestive heart failure   Cephalexin Anaphylaxis, Swelling and Rash    Extremities swell , throat swelling    Clindamycin Anaphylaxis and Shortness Of Breath    esophageal spasms   Humalog [Insulin  Lispro] Itching     big knots and whelp    Lincomycin Anaphylaxis and Shortness Of Breath   Metformin Other (See Comments)    Congestive heart failure   Novolog  [Insulin  Aspart (Human Analog)] Hives and Itching    Humalog & Novolog  big  knots and whelp     Penicillins Anaphylaxis, Hives, Swelling and Rash    Swelling of the face/tongue/throat, SOB, or low BP Childhood allergy   Tizanidine Other (See Comments)    Dizziness, Mental Status Changes, Hallucination     Adhesive [Tape] Other (See Comments)    Tears skin, Please use paper tape   Atorvastatin Other (See Comments)    increased LFT's, no muscle weakness, leg pain   Cholestyramine Other (See Comments)    Liver Disorder Elevated LFTs     Gentamycin [Gentamicin ] Hives, Itching and Rash    Fine red bumps.  Reaction noted post loop recorder implant, where gentamycin was used for irrigation. Topical prep   Iodinated Contrast Media Other (See Comments)    PAIN DURING LYMPHANGIOGRAM '81, NO ALLERGY TO IV CONTRAST.  Has had procedures with Iodine since without problems.    Limonene Hives, Itching, Rash and Other (See Comments)    Reaction noted post loop recorder implant, where gentamycin was used for irrigation. Topical prep   Prednisone  Itching and Other (See Comments)    B/P went high, itching all over.  Tolerates with benadryl     Ranexa  [Ranolazine ] Other (See Comments)    Nausea, dizziness, low blood sugar, tingling in right hand, blood blisters on tongue    Rosuvastatin Nausea And Vomiting, Nausea Only and Other (See Comments)    Muscle and joint pain & increased LFTs; tolerates Pravastatin  10mg  (max)   Simvastatin  Other (See Comments)    Increased LFT's    Zinc      Unknown   Acetaminophen      Pt told not to take tylenol  due to liver issues   Azelastine  Other (See Comments)    Metallic taste,spitting up blood   Clopidogrel  Other (See Comments)    Does not work for patient Medicine was not effective   Kerendia  [Finerenone ] Hives and Nausea Only   Lisinopril  Diarrhea, Nausea Only and Other (See Comments)   Neosporin [Bacitracin -Polymyxin B ] Dermatitis   Codeine Nausea And Vomiting   Erythromycin  Nausea And Vomiting   Hydrocodone -Acetaminophen   Itching, Rash and Other (See Comments)    itching   Latex Itching and Other (See Comments)    Iif wear gloves hands get red ,itchy no problem if other wear and touch me   Neomycin Rash and Other (See Comments)    Redness   Nickel Itching   Ranitidine Nausea Only   Tamiflu [Oseltamivir] Nausea And Vomiting   Tramadol Nausea Only     Home Medications  Prior to Admission medications   Medication Sig Start Date End Date Taking? Authorizing Provider  ALPRAZolam  (XANAX ) 0.5 MG tablet Take 1 tablet (0.5 mg total) by mouth 2 (two) times daily as needed for anxiety. 01/15/18  Yes Hagler, Vernell, MD  APIDRA  100 UNIT/ML injection Inject 40 Units into the skin as directed. INFUSE THROUGH INSULIN  PUMP UTD, Bolus depends on carb intake 02/04/15  Yes [provider]  apixaban  (ELIQUIS ) 5 MG TABS tablet Take 1 tablet (5 mg total) by mouth 2 (two) times daily. 07/12/23  Yes Rolan Ezra RAMAN, MD  aspirin  EC 81 MG tablet Take 81 mg by mouth at bedtime.   Yes [provider]  Coenzyme Q10 (CO Q 10) 100 MG CAPS Take 100 mg by mouth at bedtime.    Yes [provider]  colchicine  0.6 MG tablet Take 0.6 mg by mouth daily as needed (gout flare).   Yes [provider]  denosumab  (PROLIA ) 60 MG/ML SOSY injection Inject 60 mg into the skin every 6 (six) months.   Yes [provider]  escitalopram  (LEXAPRO ) 5 MG tablet Take 5 mg by mouth at bedtime.   Yes [provider]  famotidine  (PEPCID ) 40 MG tablet Take 40 mg by mouth at bedtime.   Yes [provider]  gabapentin  (NEURONTIN ) 100 MG capsule Take 100 mg by mouth daily.   Yes [provider]  glucagon  (GLUCAGON  EMERGENCY) 1 MG injection Inject 1 mg into the vein once as needed (low blood sugar).   Yes [provider]  isosorbide  mononitrate (IMDUR ) 120 MG 24 hr tablet Take 1 tablet (120 mg total) by mouth every morning. 05/08/23  Yes Rolan Ezra RAMAN, MD  latanoprost  (XALATAN ) 0.005 %  ophthalmic solution Place 1 drop into both eyes at bedtime.   Yes [provider]  metoprolol  succinate (TOPROL  XL) 25 MG 24 hr tablet Take 0.5 tablets (12.5 mg total) by mouth daily. 05/22/23  Yes Rolan Ezra RAMAN, MD  midodrine  (PROAMATINE ) 5 MG tablet Take 1 tablet (5 mg total) by mouth daily as needed (Prior to HD). 05/08/23  Yes  Rolan Ezra RAMAN, MD  mometasone  (NASONEX ) 50 MCG/ACT nasal spray Place 1 spray into the nose daily as needed (allergies). 03/10/20  Yes [provider]  nitroGLYCERIN  (NITROLINGUAL ) 0.4 MG/SPRAY spray PLACE 1 SPRAY UNDER TH TONGUE EVERY 5 MINUTES FOR 3 DOSES AS NEEDED FOR CHEST PAIN 12/25/22  Yes Rolan Ezra RAMAN, MD  polyethylene glycol (MIRALAX  / GLYCOLAX ) 17 g packet Take 17 g by mouth in the morning. Hold for loose stools   Yes [provider]  potassium chloride  SA (KLOR-CON  M) 20 MEQ tablet Take 1 tablet (20 mEq total) by mouth 2 (two) times daily. 07/18/23  Yes Raford Lenis, MD  REPATHA  SURECLICK 140 MG/ML SOAJ INJECT 140MG  INTO THE SKIN EVERY 14 DAYS 03/29/23  Yes Rolan Ezra RAMAN, MD  ULORIC  40 MG tablet Take 1 tablet (40 mg total) by mouth at bedtime. 11/29/17  Yes Hagler, Vernell, MD  VASCEPA  1 g capsule TAKE TWO CAPSULES (2 GRAMS TOTAL) BY MOUTH TWO TIMES DAILY 02/26/23  Yes Rolan Ezra RAMAN, MD     Critical care time: n/a       Lyle Pesa, MSN, AG-ACNP-BC [facility] 07/29/2023, 11:09 AM  See Amion for pager If no response to pager , please call [phone] until 7pm After 7:00 pm call Elink  [phone]

## 2023-07-29 NOTE — Progress Notes (Signed)
 [facility] Progress Note   Subjective:   Seen in room - resting but wakes easily. Still coughing, but slow improvement per daughter. For HD later today.  Objective Vitals:   07/28/23 2357 07/29/23 0500 07/29/23 0520 07/29/23 0758  BP: (!) 115/48  (!) 120/57 (!) 116/52  Pulse: 81  87 74  Resp: 18  18 16   Temp: 97.7 F (36.5 C)  97.6 F (36.4 C) 97.6 F (36.4 C)  TempSrc: Oral  Oral Oral  SpO2: 93%  90% 96%  Weight:  51.4 kg    Height:       Physical Exam General: Frail, sleepy woman. NAD, Nasal O2 in place. Heart: RRR; 4/6 murmur Lungs: CTA anteriorly Abdomen: soft Extremities: no LE edema Dialysis Access:  TDC + maturing RUE AVF (needs superficialization)  Additional Objective Labs: Basic Metabolic Panel: Recent Labs  Lab 07/27/23 0518 07/28/23 0548 07/29/23 0444  NA 131* 127* 128*  K 4.0 4.3 4.0  CL 92* 90* 90*  CO2 25 23 24   GLUCOSE 140* 129* 172*  BUN 40* 56* 68*  CREATININE 3.86* 4.10* 3.73*  CALCIUM  9.7 9.5 9.3  PHOS 5.5* 4.5 4.1   Liver Function Tests: Recent Labs  Lab 07/27/23 0518 07/28/23 0548 07/29/23 0444  AST 30  --   --   ALT 12  --   --   ALKPHOS 180*  --   --   BILITOT 1.1  --   --   PROT 6.9  --   --   ALBUMIN  3.4* 3.2* 3.2*   CBC: Recent Labs  Lab 07/26/23 0343 07/27/23 0518 07/27/23 2333 07/28/23 0548 07/29/23 0444  WBC 6.8 5.0 5.3 5.6 5.9  NEUTROABS  --  3.5  --  3.9 4.1  HGB 10.8* 11.3* 11.7* 11.6* 11.3*  HCT 33.6* 34.6* 35.2* 34.1* 33.3*  MCV 97.1 95.3 94.4 92.7 91.7  PLT 77* 82* 101* 94* 99*   Studies/Results: US  THORACENTESIS ASP PLEURAL SPACE W/IMG GUIDE Result Date: 07/27/2023 INDICATION: 73 year old female with cough, shortness of breath, loculated right pleural effusion. EXAM: ULTRASOUND GUIDED RIGHT THORACENTESIS MEDICATIONS: 10 mL 1% lidocaine  COMPLICATIONS: None immediate. PROCEDURE: An ultrasound guided thoracentesis was thoroughly discussed with the patient and questions answered. The  benefits, risks, alternatives and complications were also discussed. The patient understands and wishes to proceed with the procedure. Written consent was obtained. Ultrasound was performed to localize and mark an adequate pocket of fluid in the right chest. The area was then prepped and draped in the normal sterile fashion. 1% Lidocaine  was used for local anesthesia. Under ultrasound guidance a 6 Fr Safe-T-Centesis catheter was introduced. Thoracentesis was performed. The catheter was removed and a dressing applied. FINDINGS: A total of approximately 400 mL of amber fluid was removed. Samples were sent to the laboratory as requested by the clinical team. IMPRESSION: Successful ultrasound guided right thoracentesis yielding 400 mL of pleural fluid. Performed by: Kacie Matthews PA-C Electronically Signed   By: Ester Sides M.D.   On: 07/27/2023 16:13   DG Chest 1 View Result Date: 07/27/2023 CLINICAL DATA:  Pleural effusion EXAM: CHEST  1 VIEW COMPARISON:  X-ray 07/27/2023 earlier FINDINGS: Decreasing right pleural effusion with significant residual post thoracentesis. No pneumothorax. Persistent patchy right lung opacities. Stable enlarged cardiopericardial silhouette with sternal wires. Right IJ double-lumen catheter in place once again with left upper chest pacemaker. Vascular congestion interstitial changes. No left-sided pleural effusion. IMPRESSION: Decreasing right pleural effusion with significant residual. No pneumothorax post thoracentesis. Electronically  Signed   By: Ranell Bring M.D.   On: 07/27/2023 12:30   Medications:   apixaban   5 mg Oral BID   aspirin  EC  81 mg Oral Daily   benzonatate   200 mg Oral TID   Chlorhexidine  Gluconate Cloth  6 each Topical Q0600   dextromethorphan   30 mg Oral QHS   escitalopram   5 mg Oral QHS   famotidine   10 mg Oral QHS   fluticasone   2 spray Each Nare Daily   gabapentin   100 mg Oral Daily   insulin  glargine-yfgn  25 Units Subcutaneous Daily   insulin   glulisine  0-6 Units Subcutaneous TID with meals   insulin  pump   Subcutaneous TID WC, HS, 0200   lactulose   20 g Oral BID   metoprolol  succinate  12.5 mg Oral Daily   midodrine   5 mg Oral Once per day on Sunday Tuesday Thursday Saturday   sodium chloride  flush  3 mL Intravenous Q12H   Dialysis Orders Home HD (in training) MTTF, EDW 52kg, 3hrs, 2k, currently on IV iron    Assessment/Plan: Influenza A: Symptoms improving. AHRF: Due to #1 and loculated pleural effusion, s/p thora 1/4 - Cx negative.  AMS/delirium: Improving per daughter. ESRD: Recently started home HD training, follow MWF schedule here - for HD later today. HTN/volume: On midodrine . Mild hypoNa - UF as tolerated, trying for 2-3L today. Anemia of ESRD: Hgb 11.3 - wait on ESA for now Secondary HPTH: Ca/Phos ok - no binders for now. Nutrition: Alb low, adding supps. CAD/Hx CABG/HFrEF T1DM   Izetta Boehringer, DEVONNA 07/29/2023, 11:52 AM  [employer]

## 2023-07-29 NOTE — Plan of Care (Signed)
  Problem: Coping: Goal: Ability to adjust to condition or change in health will improve Outcome: Progressing   Problem: Health Behavior/Discharge Planning: Goal: Ability to manage health-related needs will improve Outcome: Progressing   Problem: Clinical Measurements: Goal: Respiratory complications will improve Outcome: Progressing   Problem: Activity: Goal: Risk for activity intolerance will decrease Outcome: Progressing

## 2023-07-29 NOTE — Progress Notes (Signed)
 PROGRESS NOTE        PATIENT DETAILS Name: [name] [name] Age: 73 y.o. Sex: female Date of Birth: 1951/03/22 Admit Date: 07/24/2023 Admitting Physician Dorn Dawson, MD ERE:Yjhozm, Vernell, MD  Brief Summary: Patient is a 73 y.o.  female with history of ESRD on HD M/T/TH/F, DM-1 on insulin  pump, CAD s/p CABG x 2, s/p bioprosthetic AVR, A-fib on Eliquis , s/p PPM in place, RCC-s/p right nephrectomy, remote history of lymphoma-presented to [facility] on 1/1 with weakness/cough-found to have encephalopathy in the setting of influenza infection.  Significant events: 1/1>> admit to hospitalist service at [facility] 1/3>> worsening hypoxemia after vomiting-possible aspiration event 1/3>> transferred to [facility]  Significant studies: 1/1>> CXR: Loculated-moderate right-sided pleural effusion.  Diffuse hazy airspace disease right lung. 1/2>> CT head: No acute intracranial abnormality  Significant microbiology data: 1/1>> influenza A PCR: Positive 1/1>> COVID/influenza B/RSV PCR: Negative 1/2>> COVID PCR: Negative 1/4>> pleural fluid culture: No growth  Procedures: 1/4>> thoracocentesis by IR  Consults: None  Subjective: No major issues overnight-she finally had a good night and was able to sleep.  Coughing is somewhat better.  Objective: Vitals: Blood pressure (!) 116/52, pulse 74, temperature 97.6 F (36.4 C), temperature source Oral, resp. rate 16, height 5' 2 (1.575 m), weight 51.4 kg, SpO2 96%.   Exam: Gen Exam:Alert awake-not in any distress HEENT:atraumatic, normocephalic Chest: B/L clear to auscultation anteriorly CVS:S1S2 regular Abdomen:soft non tender, non distended Extremities:no edema Neurology: Non focal Skin: no rash  Pertinent Labs/Radiology:    Latest Ref Rng & Units 07/29/2023    4:44 AM 07/28/2023    5:48 AM 07/27/2023   11:33 PM  CBC  WBC 4.0 - 10.5 K/uL 5.9  5.6  5.3   Hemoglobin 12.0 - 15.0 g/dL 88.6  88.3  88.2   Hematocrit 36.0 - 46.0 %  33.3  34.1  35.2   Platelets 150 - 400 K/uL 99  94  101     Lab Results  Component Value Date   NA 128 (L) 07/29/2023   K 4.0 07/29/2023   CL 90 (L) 07/29/2023   CO2 24 07/29/2023      Assessment/Plan: Acute metabolic encephalopathy Secondary to influenza infection Resolved with supportive care  Acute hypoxic respiratory failure secondary to influenza A PNA-possible aspiration PNA Hypoxia has improved She is either on room air or on minimal amount of oxygen. She previously has been intolerant to Tamiflu (per pt-has had nausea/vomiting on prior 2 attempts) Continue levofloxacin -as she seems to be clinically improving.  Sepsis secondary to influenza/PNA Improved Cultures as above On Levaquin   Loculated right-sided pleural effusion This does not appear to be a new finding-was present in her most recent PET scan done in October 2024 Underwent thoracocentesis 1/4-appears to be exudate-cultures negative so far-await cytology  PCCM input today-await recommendations.  PAF with RVR Triggered by PNA/influenza Maintaining sinus rhythm Continue metoprolol /Eliquis  Telemetry monitoring  Nausea/vomiting-aspiration event on 1/3 Following a coughing spell No further nausea/vomiting  Episode of epistaxis on 1/4 Resolved with Afrin-no epistaxis this morning.  ESRD on HD M/T/T/F Nephrology following  Chronic HFpEF Euvolemic Volume removal with HD  Hyponatremia Should improve with further HD Follow periodically Fluid restriction  Sick sinus syndrome-s/p PPM implantation Telemetry monitoring  CAD s/p CABG x 2 (2005/2011) and bioprosthetic AVR 2011 ASA/metoprolol  Resume Repatha  injections postdischarge  HTN BP stable on metoprolol  Resume Imdur   when BP is a bit more stable.  DM-1 (A1c 6.7 on 1/1) No further episodes of hypoglycemia CBG stable Semglee  25 units + SSI  Will consult diabetic coordinator to see if we can put her back on her insulin  pump.  Recent Labs     07/28/23 1614 07/28/23 2133 07/29/23 1023  GLUCAP 188* 166* 105*   GERD Pepcid   Mood disorder Stable Xanax /Lexapro   Restless leg/peripheral neuropathy Continue Neurontin   History of Hodgkin's lymphoma S/p mantle radiation 1981-in remission since then Follows with oncology  Renal cell carcinoma-s/p right nephrectomy 2008 Follows with oncology  Left breast DCIS-s/p left mastectomy 2016 Follows with oncology  Debility/deconditioning PT/OT eval ordered-Home health recommended.  OSA BiPAP  Underweight: Estimated body mass index is 20.73 kg/m as calculated from the following:   Height as of this encounter: 5' 2 (1.575 m).   Weight as of this encounter: 51.4 kg.   Code status:   Code Status: Full Code   DVT Prophylaxis: apixaban  (ELIQUIS ) tablet 5 mg    Family Communication: Daughter at bedside   Disposition Plan: Status is: Inpatient Remains inpatient appropriate because: Severity of illness   Planned Discharge Destination:Home   Diet: Diet Order             Diet Carb Modified Fluid consistency: Thin; Room service appropriate? Yes  Diet effective now                     Antimicrobial agents: Anti-infectives (From admission, onward)    Start     Dose/Rate Route Frequency Ordered Stop   07/27/23 1045  fluconazole  (DIFLUCAN ) tablet 150 mg        150 mg Oral  Once 07/27/23 0951 07/27/23 1740   07/27/23 0800  levofloxacin  (LEVAQUIN ) tablet 500 mg        500 mg Oral Every 48 hours 07/25/23 0329 07/29/23 1035   07/25/23 0330  levofloxacin  (LEVAQUIN ) tablet 750 mg        750 mg Oral  Once 07/25/23 0329 07/25/23 0400        MEDICATIONS: Scheduled Meds:  apixaban   5 mg Oral BID   aspirin  EC  81 mg Oral Daily   benzonatate   200 mg Oral TID   Chlorhexidine  Gluconate Cloth  6 each Topical Q0600   dextromethorphan   30 mg Oral QHS   escitalopram   5 mg Oral QHS   famotidine   10 mg Oral QHS   fluticasone   2 spray Each Nare Daily   gabapentin   100  mg Oral Daily   insulin  glargine-yfgn  25 Units Subcutaneous Daily   insulin  glulisine  0-6 Units Subcutaneous TID with meals   lactulose   20 g Oral BID   metoprolol  succinate  12.5 mg Oral Daily   midodrine   5 mg Oral Once per day on Sunday Tuesday Thursday Saturday   sodium chloride  flush  3 mL Intravenous Q12H   Continuous Infusions: PRN Meds:.acetaminophen , ALPRAZolam , alteplase , heparin , levalbuterol , lidocaine  (PF), lidocaine -prilocaine , ondansetron  (ZOFRAN ) IV, mouth rinse, pentafluoroprop-tetrafluoroeth, sodium chloride    I have personally reviewed following labs and imaging studies  LABORATORY DATA: CBC: Recent Labs  Lab 07/26/23 0343 07/27/23 0518 07/27/23 2333 07/28/23 0548 07/29/23 0444  WBC 6.8 5.0 5.3 5.6 5.9  NEUTROABS  --  3.5  --  3.9 4.1  HGB 10.8* 11.3* 11.7* 11.6* 11.3*  HCT 33.6* 34.6* 35.2* 34.1* 33.3*  MCV 97.1 95.3 94.4 92.7 91.7  PLT 77* 82* 101* 94* 99*    Basic Metabolic Panel: Recent  Labs  Lab 07/24/23 2253 07/26/23 0343 07/27/23 0518 07/28/23 0548 07/29/23 0444  NA 131* 129* 131* 127* 128*  K 4.0 4.3 4.0 4.3 4.0  CL 94* 92* 92* 90* 90*  CO2 26 23 25 23 24   GLUCOSE 133* 151* 140* 129* 172*  BUN 35* 52* 40* 56* 68*  CREATININE 3.58* 4.09* 3.86* 4.10* 3.73*  CALCIUM  10.0 9.2 9.7 9.5 9.3  PHOS  --  5.9* 5.5* 4.5 4.1    GFR: Estimated Creatinine Clearance: 11.1 mL/min (A) (by C-G formula based on SCr of 3.73 mg/dL (H)).  Liver Function Tests: Recent Labs  Lab 07/26/23 0343 07/27/23 0518 07/28/23 0548 07/29/23 0444  AST  --  30  --   --   ALT  --  12  --   --   ALKPHOS  --  180*  --   --   BILITOT  --  1.1  --   --   PROT  --  6.9  --   --   ALBUMIN  3.6 3.4* 3.2* 3.2*   No results for input(s): LIPASE, AMYLASE in the last 168 hours. Recent Labs  Lab 07/25/23 0036  AMMONIA 43*    Coagulation Profile: Recent Labs  Lab 07/24/23 2253 07/27/23 2333  INR 2.3* 2.4*    Cardiac Enzymes: No results for input(s):  CKTOTAL, CKMB, CKMBINDEX, TROPONINI in the last 168 hours.  BNP (last 3 results) No results for input(s): PROBNP in the last 8760 hours.  Lipid Profile: No results for input(s): CHOL, HDL, LDLCALC, TRIG, CHOLHDL, LDLDIRECT in the last 72 hours.  Thyroid  Function Tests: No results for input(s): TSH, T4TOTAL, FREET4, T3FREE, THYROIDAB in the last 72 hours.   Anemia Panel: No results for input(s): VITAMINB12, FOLATE, FERRITIN, TIBC, IRON , RETICCTPCT in the last 72 hours.   Urine analysis:    Component Value Date/Time   COLORURINE YELLOW 04/17/2022 1514   APPEARANCEUR CLEAR 04/17/2022 1514   LABSPEC <1.005 (L) 04/17/2022 1514   LABSPEC 1.010 10/17/2015 0808   PHURINE 6.5 04/17/2022 1514   GLUCOSEU NEGATIVE 04/17/2022 1514   GLUCOSEU 500 10/17/2015 0808   HGBUR NEGATIVE 04/17/2022 1514   BILIRUBINUR NEGATIVE 04/17/2022 1514   BILIRUBINUR Negative 10/17/2015 0808   KETONESUR NEGATIVE 04/17/2022 1514   PROTEINUR NEGATIVE 04/17/2022 1514   UROBILINOGEN 0.2 10/17/2015 0808   NITRITE NEGATIVE 04/17/2022 1514   LEUKOCYTESUR NEGATIVE 04/17/2022 1514   LEUKOCYTESUR Negative 10/17/2015 0808    Sepsis Labs: Lactic Acid, Venous    Component Value Date/Time   LATICACIDVEN 1.7 07/24/2023 2253    MICROBIOLOGY: Recent Results (from the past 240 hours)  Resp panel by RT-PCR (RSV, Flu A&B, Covid) Anterior Nasal Swab     Status: Abnormal   Collection Time: 07/24/23  9:55 PM   Specimen: Anterior Nasal Swab  Result Value Ref Range Status   SARS Coronavirus 2 by RT PCR NEGATIVE NEGATIVE Final    Comment: (NOTE) SARS-CoV-2 target nucleic acids are NOT DETECTED.  The SARS-CoV-2 RNA is generally detectable in upper respiratory specimens during the acute phase of infection. The lowest concentration of SARS-CoV-2 viral copies this assay can detect is 138 copies/mL. A negative result does not preclude SARS-Cov-2 infection and should not be used as  the sole basis for treatment or other patient management decisions. A negative result may occur with  improper specimen collection/handling, submission of specimen other than nasopharyngeal swab, presence of viral mutation(s) within the areas targeted by this assay, and inadequate number of viral copies(<138 copies/mL). A negative  result must be combined with clinical observations, patient history, and epidemiological information. The expected result is Negative.  Fact Sheet for Patients:  bloggercourse.com  Fact Sheet for Healthcare Providers:  seriousbroker.it  This test is no t yet approved or cleared by the [place]  FDA and  has been authorized for detection and/or diagnosis of SARS-CoV-2 by FDA under an Emergency Use Authorization (EUA). This EUA will remain  in effect (meaning this test can be used) for the duration of the COVID-19 declaration under Section 564(b)(1) of the Act, 21 U.S.C.section 360bbb-3(b)(1), unless the authorization is terminated  or revoked sooner.       Influenza A by PCR POSITIVE (A) NEGATIVE Final   Influenza B by PCR NEGATIVE NEGATIVE Final    Comment: (NOTE) The Xpert Xpress SARS-CoV-2/FLU/RSV plus assay is intended as an aid in the diagnosis of influenza from Nasopharyngeal swab specimens and should not be used as a sole basis for treatment. Nasal washings and aspirates are unacceptable for Xpert Xpress SARS-CoV-2/FLU/RSV testing.  Fact Sheet for Patients: bloggercourse.com  Fact Sheet for Healthcare Providers: seriousbroker.it  This test is not yet approved or cleared by the [place]  FDA and has been authorized for detection and/or diagnosis of SARS-CoV-2 by FDA under an Emergency Use Authorization (EUA). This EUA will remain in effect (meaning this test can be used) for the duration of the COVID-19 declaration under Section  564(b)(1) of the Act, 21 U.S.C. section 360bbb-3(b)(1), unless the authorization is terminated or revoked.     Resp Syncytial Virus by PCR NEGATIVE NEGATIVE Final    Comment: (NOTE) Fact Sheet for Patients: bloggercourse.com  Fact Sheet for Healthcare Providers: seriousbroker.it  This test is not yet approved or cleared by the [place]  FDA and has been authorized for detection and/or diagnosis of SARS-CoV-2 by FDA under an Emergency Use Authorization (EUA). This EUA will remain in effect (meaning this test can be used) for the duration of the COVID-19 declaration under Section 564(b)(1) of the Act, 21 U.S.C. section 360bbb-3(b)(1), unless the authorization is terminated or revoked.  Performed at [facility], [address], [place], [place] [address]   Blood Culture (routine x 2)     Status: None (Preliminary result)   Collection Time: 07/25/23 12:36 AM   Specimen: BLOOD  Result Value Ref Range Status   Specimen Description BLOOD BLOOD LEFT ARM  Final   Special Requests   Final    BOTTLES DRAWN AEROBIC AND ANAEROBIC Blood Culture adequate volume   Culture   Final    NO GROWTH 4 DAYS Performed at [facility], [address], [place], [place] [address]    Report Status PENDING  Incomplete  Blood Culture (routine x 2)     Status: None (Preliminary result)   Collection Time: 07/25/23 12:36 AM   Specimen: BLOOD  Result Value Ref Range Status   Specimen Description BLOOD BLOOD LEFT HAND  Final   Special Requests   Final    BOTTLES DRAWN AEROBIC AND ANAEROBIC Blood Culture adequate volume   Culture   Final    NO GROWTH 4 DAYS Performed at [facility], [address] [place], [place] [address]    Report Status PENDING  Incomplete  Culture, body fluid w Gram Stain-bottle     Status: None (Preliminary result)   Collection Time: 07/27/23 12:12 PM   Specimen: Pleura  Result Value Ref Range Status   Specimen Description  PLEURAL  Final   Special Requests NONE  Final   Culture  Final    NO GROWTH 2 DAYS Performed at [facility], [address], [place], [place] [address]    Report Status PENDING  Incomplete  Gram stain     Status: None   Collection Time: 07/27/23 12:12 PM   Specimen: Pleura  Result Value Ref Range Status   Specimen Description PLEURAL  Final   Special Requests NONE  Final   Gram Stain   Final    NO WBC SEEN NO ORGANISMS SEEN Performed at [facility], [address] [place], [place] [address]    Report Status 07/27/2023 FINAL  Final    RADIOLOGY STUDIES/RESULTS: US  THORACENTESIS ASP PLEURAL SPACE W/IMG GUIDE Result Date: 07/27/2023 INDICATION: 73 year old female with cough, shortness of breath, loculated right pleural effusion. EXAM: ULTRASOUND GUIDED RIGHT THORACENTESIS MEDICATIONS: 10 mL 1% lidocaine  COMPLICATIONS: None immediate. PROCEDURE: An ultrasound guided thoracentesis was thoroughly discussed with the patient and questions answered. The benefits, risks, alternatives and complications were also discussed. The patient understands and wishes to proceed with the procedure. Written consent was obtained. Ultrasound was performed to localize and mark an adequate pocket of fluid in the right chest. The area was then prepped and draped in the normal sterile fashion. 1% Lidocaine  was used for local anesthesia. Under ultrasound guidance a 6 Fr Safe-T-Centesis catheter was introduced. Thoracentesis was performed. The catheter was removed and a dressing applied. FINDINGS: A total of approximately 400 mL of amber fluid was removed. Samples were sent to the laboratory as requested by the clinical team. IMPRESSION: Successful ultrasound guided right thoracentesis yielding 400 mL of pleural fluid. Performed by: Kacie Matthews PA-C Electronically Signed   By: Ester Sides M.D.   On: 07/27/2023 16:13   DG Chest 1 View Result Date: 07/27/2023 CLINICAL DATA:  Pleural effusion EXAM: CHEST   1 VIEW COMPARISON:  X-ray 07/27/2023 earlier FINDINGS: Decreasing right pleural effusion with significant residual post thoracentesis. No pneumothorax. Persistent patchy right lung opacities. Stable enlarged cardiopericardial silhouette with sternal wires. Right IJ double-lumen catheter in place once again with left upper chest pacemaker. Vascular congestion interstitial changes. No left-sided pleural effusion. IMPRESSION: Decreasing right pleural effusion with significant residual. No pneumothorax post thoracentesis. Electronically Signed   By: Ranell Bring M.D.   On: 07/27/2023 12:30     LOS: 4 days   Donalda Applebaum, MD  [facility]    To contact the attending provider between 7A-7P or the covering provider during after hours 7P-7A, please log into the web site [email or web address] and access using universal [place] password for that web site. If you do not have the password, please call the hospital operator.  07/29/2023, 11:20 AM

## 2023-07-29 NOTE — TOC Progression Note (Signed)
 Transition of Care [facility]) - Progression Note    Patient Details  Name: [name] [name] MRN: [id number] Date of Birth: 18-Jun-1951  [facility]) CM/SW Contact  Hendricks KANDICE Her, RN Phone Number: 07/29/2023, 10:53 AM  Clinical Narrative:     RNCM reached our to [employer] to follow up on auth for patient  [facility] following          Expected Discharge Plan and Services                                               Social Determinants of Health (SDOH) Interventions SDOH Screenings   Food Insecurity: No Food Insecurity (07/26/2023)  Housing: Low Risk  (07/26/2023)  Transportation Needs: No Transportation Needs (07/26/2023)  Utilities: Not At Risk (07/26/2023)  Depression (PHQ2-9): Low Risk  (09/08/2018)  Financial Resource Strain: Low Risk  (01/02/2023)   Received from [facility], [facility]  Physical Activity: Insufficiently Active (03/17/2021)   Received [facility]  Social Connections: Socially Isolated (07/26/2023)  Stress: Stress Concern Present (03/17/2021)   Received from [facility], [facility]  Tobacco Use: Low Risk  (07/24/2023)    Readmission Risk Interventions     No data to display

## 2023-07-29 NOTE — Progress Notes (Signed)
 Mobility Specialist Progress Note;   07/29/23 1210  Mobility  Activity Ambulated with assistance in room  Level of Assistance Standby assist, set-up cues, supervision of patient - no hands on  Distance Ambulated (ft) 30 ft  Activity Response Tolerated well  Mobility Referral Yes  Mobility visit 1 Mobility  Mobility Specialist Start Time (ACUTE ONLY) 1210  Mobility Specialist Stop Time (ACUTE ONLY) 1220  Mobility Specialist Time Calculation (min) (ACUTE ONLY) 10 min   Pt agreeable to mobility. Required only SV for in room ambulation. VSS throughout and no c/o. Pt returned back to chair with all needs met, eating lunch. Daughter in room.   Lauraine Erm Mobility Specialist Please contact via SecureChat or [employer] [phone]

## 2023-07-30 ENCOUNTER — Inpatient Hospital Stay (HOSPITAL_COMMUNITY): Payer: Medicare PPO

## 2023-07-30 DIAGNOSIS — J101 Influenza due to other identified influenza virus with other respiratory manifestations: Secondary | ICD-10-CM | POA: Diagnosis not present

## 2023-07-30 DIAGNOSIS — I38 Endocarditis, valve unspecified: Secondary | ICD-10-CM

## 2023-07-30 DIAGNOSIS — J90 Pleural effusion, not elsewhere classified: Secondary | ICD-10-CM | POA: Diagnosis not present

## 2023-07-30 DIAGNOSIS — R410 Disorientation, unspecified: Secondary | ICD-10-CM | POA: Diagnosis not present

## 2023-07-30 DIAGNOSIS — R531 Weakness: Secondary | ICD-10-CM | POA: Diagnosis not present

## 2023-07-30 LAB — RENAL FUNCTION PANEL
Albumin: 3.4 g/dL — ABNORMAL LOW (ref 3.5–5.0)
Anion gap: 15 (ref 5–15)
BUN: 28 mg/dL — ABNORMAL HIGH (ref 8–23)
CO2: 23 mmol/L (ref 22–32)
Calcium: 9.5 mg/dL (ref 8.9–10.3)
Chloride: 92 mmol/L — ABNORMAL LOW (ref 98–111)
Creatinine, Ser: 2.11 mg/dL — ABNORMAL HIGH (ref 0.44–1.00)
GFR, Estimated: 25 mL/min — ABNORMAL LOW (ref >60)
Glucose, Bld: 169 mg/dL — ABNORMAL HIGH (ref 70–99)
Phosphorus: 2.9 mg/dL (ref 2.5–4.6)
Potassium: 3.7 mmol/L (ref 3.5–5.1)
Sodium: 130 mmol/L — ABNORMAL LOW (ref 135–145)

## 2023-07-30 LAB — CULTURE, BLOOD (ROUTINE X 2)
Culture: NO GROWTH
Culture: NO GROWTH
Special Requests: ADEQUATE
Special Requests: ADEQUATE

## 2023-07-30 LAB — GLUCOSE, CAPILLARY
Glucose-Capillary: 136 mg/dL — ABNORMAL HIGH (ref 70–99)
Glucose-Capillary: 130 mg/dL — ABNORMAL HIGH (ref 70–99)
Glucose-Capillary: 146 mg/dL — ABNORMAL HIGH (ref 70–99)
Glucose-Capillary: 212 mg/dL — ABNORMAL HIGH (ref 70–99)

## 2023-07-30 LAB — CBC
HCT: 33.7 % — ABNORMAL LOW (ref 36.0–46.0)
Hemoglobin: 11.4 g/dL — ABNORMAL LOW (ref 12.0–15.0)
MCH: 31 pg (ref 26.0–34.0)
MCHC: 33.8 g/dL (ref 30.0–36.0)
MCV: 91.6 fL (ref 80.0–100.0)
Platelets: 120 10*3/uL — ABNORMAL LOW (ref 150–400)
RBC: 3.68 MIL/uL — ABNORMAL LOW (ref 3.87–5.11)
RDW: 14.1 % (ref 11.5–15.5)
WBC: 7.8 10*3/uL (ref 4.0–10.5)
nRBC: 0 % (ref 0.0–0.2)

## 2023-07-30 LAB — ECHOCARDIOGRAM LIMITED
Height: 62 in
S' Lateral: 1.9 cm
Weight: 1957.68 [oz_av]

## 2023-07-30 LAB — CBC WITH DIFFERENTIAL/PLATELET
Abs Immature Granulocytes: 0.05 10*3/uL (ref 0.00–0.07)
Basophils Absolute: 0.1 10*3/uL (ref 0.0–0.1)
Basophils Relative: 1 %
Eosinophils Absolute: 0 10*3/uL (ref 0.0–0.5)
Eosinophils Relative: 0 %
HCT: 36.5 % (ref 36.0–46.0)
Hemoglobin: 12.4 g/dL (ref 12.0–15.0)
Immature Granulocytes: 1 %
Lymphocytes Relative: 10 %
Lymphs Abs: 1 10*3/uL (ref 0.7–4.0)
MCH: 31 pg (ref 26.0–34.0)
MCHC: 34 g/dL (ref 30.0–36.0)
MCV: 91.3 fL (ref 80.0–100.0)
Monocytes Absolute: 1.3 10*3/uL — ABNORMAL HIGH (ref 0.1–1.0)
Monocytes Relative: 13 %
Neutro Abs: 7.9 10*3/uL — ABNORMAL HIGH (ref 1.7–7.7)
Neutrophils Relative %: 75 %
Platelets: 130 10*3/uL — ABNORMAL LOW (ref 150–400)
RBC: 4 MIL/uL (ref 3.87–5.11)
RDW: 13.9 % (ref 11.5–15.5)
WBC: 10.3 10*3/uL (ref 4.0–10.5)
nRBC: 0 % (ref 0.0–0.2)

## 2023-07-30 LAB — PROCALCITONIN: Procalcitonin: 0.6 ng/mL

## 2023-07-30 LAB — CYTOLOGY - NON PAP

## 2023-07-30 LAB — TYPE AND SCREEN
ABO/RH(D): O POS
Antibody Screen: NEGATIVE

## 2023-07-30 NOTE — Progress Notes (Signed)
 PT Cancellation Note  Patient Details Name: [name] [name] MRN: [id number] DOB: August 15, 1950   Cancelled Treatment:    Reason Eval/Treat Not Completed: (P) Fatigue/lethargy limiting ability to participate (Attempted to see pt twice, however unable to arouse for session. Will continue to follow per PT POC.)   Darryle George 07/30/2023, 3:49 PM

## 2023-07-30 NOTE — Progress Notes (Signed)
 Mobility Specialist Progress Note;   07/30/23 1207  Mobility  Activity Transferred from bed to chair  Level of Assistance Contact guard assist, steadying assist  Assistive Device Other (Comment) (HHA)  Distance Ambulated (ft) 3 ft  Activity Response Tolerated well  Mobility Referral Yes  Mobility visit 1 Mobility  Mobility Specialist Start Time (ACUTE ONLY) 1207  Mobility Specialist Stop Time (ACUTE ONLY) 1220  Mobility Specialist Time Calculation (min) (ACUTE ONLY) 13 min   Pt agreeable to mobility. Limited to transfer from bed to chair d/t fatigue. Required MinG assistance throughout transfer. VSS throughout. Pt left comfortably in chair with all needs met. Husband in room.   Lauraine Erm Mobility Specialist Please contact via SecureChat or [employer] [phone]

## 2023-07-30 NOTE — Discharge Instructions (Addendum)
 Transportation Resources  -[facility] [employer]: [phone]   -[facility] for an application. No fee for people over the age of 39. P: [phone]. Address: [address] [place], [place] [address]  -[employer]-Age 71 and over. Limit of 1 ride per week for ambulatory participants. Limit 1 ride per month for non-ambulatory participants needing wheelchair transportation. Contact: [phone] ([place]), [phone] [facility]).  -[employer] [place]: Available for people with disabilities who are unable to use fixed-route bus service. Fee applies. Contact: [phone] (For application requests)/([phone]) [phone] (Customer [phone] (I-ride reservation line)/([phone] (Access [place] reservation line)

## 2023-07-30 NOTE — Progress Notes (Signed)
 Spoke to CHARITY FUNDRAISER at [facility] therapy dept. Pt is receiving training for home HD on Mon, Tues, Thurs, Fri and that will continue at d/c. Pt has 5 weeks of training per staff. Case management staff made aware of pt's husband's concerns regarding pt's weakness and getting pt to HD appts at d/c per renal PA request. PT to continue to work with pt.CSW placed transportation resources on AVS as well. Will assist as needed.  Randine Mungo Renal Navigator [phone]

## 2023-07-30 NOTE — Progress Notes (Signed)
   07/30/23 0130  Vitals  Temp 98.8 F (37.1 C)  BP (!) 129/41  MAP (mmHg) 68  BP Location Left Arm  BP Method Automatic  Pulse Rate 79  Pulse Rate Source Monitor  ECG Heart Rate 81  Resp 14  Oxygen Therapy  SpO2 100 %  During Treatment Monitoring  Blood Flow Rate (mL/min) 0 mL/min  Arterial Pressure (mmHg) 3.84 mmHg  Venous Pressure (mmHg) 52.52 mmHg  TMP (mmHg) -4.04 mmHg  Ultrafiltration Rate (mL/min) 830 mL/min  Dialysate Flow Rate (mL/min) 300 ml/min  Dialysate Potassium Concentration 3  Dialysate Calcium  Concentration 2.5  Duration of HD Treatment -hour(s) 3 hour(s)  Cumulative Fluid Removed (mL) per Treatment  2000.11  HD Safety Checks Performed Yes  Intra-Hemodialysis Comments Tx completed  Post Treatment  Dialyzer Clearance Lightly streaked  Fluid Removed (mL) 2000 mL  Tolerated HD Treatment Yes  Hemodialysis Catheter Right Internal jugular Double lumen Permanent (Tunneled)  Placement Date/Time: 05/01/23 0948   Placed prior to admission: No  Serial / Lot #: [id number]  Expiration Date: 05/23/27  Time Out: Correct patient;Correct site;Correct procedure  Maximum sterile barrier precautions: Hand hygiene;Cap;Mask;Sterile gown...  Site Condition No complications  Blue Lumen Status Heparin  locked  Red Lumen Status Heparin  locked  Catheter fill solution Heparin  1000 units/ml  Catheter fill volume (Arterial) 1.8 cc  Catheter fill volume (Venous) 1.8  Dressing Type Transparent  Dressing Status Antimicrobial disc in place;Clean, Dry, Intact  Drainage Description None  Dressing Change Due 08/03/23  Post treatment catheter status Capped and Clamped   Pt Tolerated treatment without complication

## 2023-07-30 NOTE — Plan of Care (Signed)
   Problem: Coping: Goal: Level of anxiety will decrease Outcome: Progressing

## 2023-07-30 NOTE — Plan of Care (Signed)
   Problem: Coping: Goal: Ability to adjust to condition or change in health will improve Outcome: Progressing   Problem: Health Behavior/Discharge Planning: Goal: Ability to manage health-related needs will improve Outcome: Progressing   Problem: Metabolic: Goal: Ability to maintain appropriate glucose levels will improve Outcome: Progressing   Problem: Nutritional: Goal: Maintenance of adequate nutrition will improve Outcome: Progressing

## 2023-07-30 NOTE — Inpatient Diabetes Management (Signed)
 Inpatient Diabetes Program Recommendations  AACE/ADA: New Consensus Statement on Inpatient Glycemic Control (2015)  Target Ranges:  Prepandial:   less than 140 mg/dL      Peak postprandial:   less than 180 mg/dL (1-2 hours)      Critically ill patients:  140 - 180 mg/dL   Lab Results  Component Value Date   GLUCAP 130 (H) 07/30/2023   HGBA1C 6.7 (H) 07/24/2023    Review of Glycemic Control  Latest Reference Range & Units 07/30/23 08:17 07/30/23 11:40  Glucose-Capillary 70 - 99 mg/dL 863 (H) 869 (H)  (H): Data is abnormally high Diabetes history: DM1 (does NOT make any insulin ; requires basal, correction, and carb coverage insulin ) Outpatient Diabetes medications: Medtronic insulin  pump 780G with Apidria; Lantus  38 units daily when not using pump Current orders for Inpatient glycemic control: Semglee  6 units at bedtime, Apidra  0-6 units TID   Inpatient Diabetes Program Recommendations:    Spoke with Wadie at bedside to further reinforce discussion yesterday. Reviewed current insulin  needs compared to outpatient insulin  pump rates, plan while admitted and need to schedule appointment with Dr Von, [facility] endocrinology.  Educated patient and spouse on insulin  pen use at home. Reviewed contents of insulin  flexpen starter kit. Reviewed all steps if insulin  pen including attachment of needle, 2-unit air shot, dialing up dose, giving injection, removing needle, disposal of sharps, storage of unused insulin , disposal of insulin  etc. Patient able to provide successful return demonstration. Also reviewed troubleshooting with insulin  pen. MD to give patient Rxs for insulin  pens and insulin  pen needles. No further questions at this time.  Plan for SQ at discharge if insulin  needs continues to be significantly reduced.  Thanks, Tinnie Minus, MSN, RNC-OB Diabetes Coordinator [phone] (8a-5p)

## 2023-07-30 NOTE — Progress Notes (Addendum)
 PROGRESS NOTE        PATIENT DETAILS Name: [name] [name] Age: 73 y.o. Sex: female Date of Birth: 04-Aug-1950 Admit Date: 07/24/2023 Admitting Physician Dorn Dawson, MD ERE:Yjhozm, Vernell, MD  Brief Summary: Patient is a 73 y.o.  female with history of ESRD on HD M/T/TH/F, DM-1 on insulin  pump, CAD s/p CABG x 2, s/p bioprosthetic AVR, A-fib on Eliquis , s/p PPM in place, RCC-s/p right nephrectomy, remote history of lymphoma-presented to [facility] on 1/1 with weakness/cough-found to have encephalopathy in the setting of influenza infection.  Significant events: 1/1>> admit to hospitalist service at [facility] 1/3>> worsening hypoxemia after vomiting-possible aspiration event 1/3>> transferred to [facility]  Significant studies: 1/1>> CXR: Loculated-moderate right-sided pleural effusion.  Diffuse hazy airspace disease right lung. 1/2>> CT head: No acute intracranial abnormality  Significant microbiology data: 1/1>> influenza A PCR: Positive 1/1>> COVID/influenza B/RSV PCR: Negative 1/2>> COVID PCR: Negative 1/4>> pleural fluid culture: No growth  Procedures: 1/4>> thoracocentesis by IR  Consults: None  Subjective: Came back from hemodialysis earlier this morning-sleeping comfortably-no complaints.  On room air.  Objective: Vitals: Blood pressure (!) 109/41, pulse 79, temperature 97.6 F (36.4 C), temperature source Oral, resp. rate 14, height 5' 2 (1.575 m), weight 55.5 kg, SpO2 100%.   Exam: Gen Exam:Alert awake-not in any distress HEENT:atraumatic, normocephalic Chest: B/L clear to auscultation anteriorly CVS:S1S2 regular Abdomen:soft non tender, non distended Extremities:no edema Neurology: Non focal Skin: no rash  Pertinent Labs/Radiology:    Latest Ref Rng & Units 07/30/2023    4:33 AM 07/29/2023    4:44 AM 07/28/2023    5:48 AM  CBC  WBC 4.0 - 10.5 K/uL 10.3  5.9  5.6   Hemoglobin 12.0 - 15.0 g/dL 87.5  88.6  88.3   Hematocrit 36.0 - 46.0 % 36.5   33.3  34.1   Platelets 150 - 400 K/uL 130  99  94     Lab Results  Component Value Date   NA 130 (L) 07/30/2023   K 3.7 07/30/2023   CL 92 (L) 07/30/2023   CO2 23 07/30/2023      Assessment/Plan: Acute metabolic encephalopathy Secondary to influenza infection Resolved with supportive care  Acute hypoxic respiratory failure secondary to influenza A PNA-possible aspiration PNA Hypoxia has improved-hide on room air now requiring minimal amount of oxygen. Completed course of levofloxacin  She previously has been intolerant to Tamiflu (per pt-has had nausea/vomiting on prior 2 attempts)  Sepsis secondary to influenza/PNA Improved Cultures as above Completed a course of levofloxacin .  Loculated right-sided pleural effusion Not a new finding-present on most recent PET scan October 2024 Underwent thoracocentesis 1/4-consistent with exudate-cytology pending PCCM suspecting trapped lung Discussed with Dr. Lacie MD-recommendations are for echo/procalcitonin level-May need Levaquin  to be restarted.  Will await further input from PCCM.  PAF with RVR Triggered by PNA/influenza Maintaining sinus rhythm Continue metoprolol /Eliquis  Telemetry monitoring  Nausea/vomiting-aspiration event on 1/3 Following a coughing spell No further nausea/vomiting  Episode of epistaxis on 1/4 Resolved with Afrin-no epistaxis this morning.  ESRD on HD M/T/T/F Nephrology following  Chronic HFpEF Euvolemic Volume removal with HD  Hyponatremia Improved with HD Follow electrolytes periodically.  Sick sinus syndrome-s/p PPM implantation Telemetry monitoring  CAD s/p CABG x 2 (2005/2011) and bioprosthetic AVR 2011 ASA/metoprolol  Resume Repatha  injections postdischarge  HTN BP stable on metoprolol  Resume Imdur  when BP is a bit  more stable.  DM-1 (A1c 6.7 on 1/1) No further episodes of hypoglycemia CBG stable Semglee  6 units + SSI  Patient not keen on going back on insulin  pump-while  inpatient for diabetic coordinator on 1/6-plan is to resume it on discharge.  Recent Labs    07/29/23 1643 07/29/23 2121 07/30/23 0817  GLUCAP 180* 168* 136*    NASH liver cirrhoses Compensated Follows with [facility]  GERD Pepcid   Mood disorder Stable Xanax /Lexapro   Restless leg/peripheral neuropathy Continue Neurontin   History of Hodgkin's lymphoma S/p mantle radiation 1981-in remission since then Follows with oncology  Renal cell carcinoma-s/p right nephrectomy 2008 Follows with oncology  Left breast DCIS-s/p left mastectomy 2016 Follows with oncology  Debility/deconditioning PT/OT eval ordered-Home health recommended.  OSA BiPAP  Underweight: Estimated body mass index is 22.38 kg/m as calculated from the following:   Height as of this encounter: 5' 2 (1.575 m).   Weight as of this encounter: 55.5 kg.   Code status:   Code Status: Full Code   DVT Prophylaxis: apixaban  (ELIQUIS ) tablet 5 mg    Family Communication: Daughter at bedside   Disposition Plan: Status is: Inpatient Remains inpatient appropriate because: Severity of illness   Planned Discharge Destination:Home   Diet: Diet Order             Diet Carb Modified Fluid consistency: Thin; Room service appropriate? Yes  Diet effective now                     Antimicrobial agents: Anti-infectives (From admission, onward)    Start     Dose/Rate Route Frequency Ordered Stop   07/27/23 1045  fluconazole  (DIFLUCAN ) tablet 150 mg        150 mg Oral  Once 07/27/23 0951 07/27/23 1740   07/27/23 0800  levofloxacin  (LEVAQUIN ) tablet 500 mg        500 mg Oral Every 48 hours 07/25/23 0329 07/29/23 1035   07/25/23 0330  levofloxacin  (LEVAQUIN ) tablet 750 mg        750 mg Oral  Once 07/25/23 0329 07/25/23 0400        MEDICATIONS: Scheduled Meds:  (feeding supplement) PROSource Plus  30 mL Oral BID BM   apixaban   5 mg Oral BID   aspirin  EC  81 mg Oral Daily   benzonatate   200 mg Oral  TID   Chlorhexidine  Gluconate Cloth  6 each Topical Q0600   dextromethorphan   30 mg Oral QHS   escitalopram   5 mg Oral QHS   famotidine   10 mg Oral QHS   fluticasone   2 spray Each Nare Daily   gabapentin   100 mg Oral Daily   insulin  glargine-yfgn  6 Units Subcutaneous Daily   insulin  glulisine  0-6 Units Subcutaneous TID with meals   lactulose   20 g Oral BID   metoprolol  succinate  12.5 mg Oral Daily   midodrine   5 mg Oral Once per day on Sunday Tuesday Thursday Saturday   sodium chloride  flush  3 mL Intravenous Q12H   Continuous Infusions: PRN Meds:.acetaminophen , ALPRAZolam , levalbuterol , ondansetron  (ZOFRAN ) IV, mouth rinse, sodium chloride    I have personally reviewed following labs and imaging studies  LABORATORY DATA: CBC: Recent Labs  Lab 07/27/23 0518 07/27/23 2333 07/28/23 0548 07/29/23 0444 07/30/23 0433  WBC 5.0 5.3 5.6 5.9 10.3  NEUTROABS 3.5  --  3.9 4.1 7.9*  HGB 11.3* 11.7* 11.6* 11.3* 12.4  HCT 34.6* 35.2* 34.1* 33.3* 36.5  MCV 95.3 94.4 92.7 91.7 91.3  PLT 82* 101* 94* 99* 130*    Basic Metabolic Panel: Recent Labs  Lab 07/26/23 0343 07/27/23 0518 07/28/23 0548 07/29/23 0444 07/30/23 0433  NA 129* 131* 127* 128* 130*  K 4.3 4.0 4.3 4.0 3.7  CL 92* 92* 90* 90* 92*  CO2 23 25 23 24 23   GLUCOSE 151* 140* 129* 172* 169*  BUN 52* 40* 56* 68* 28*  CREATININE 4.09* 3.86* 4.10* 3.73* 2.11*  CALCIUM  9.2 9.7 9.5 9.3 9.5  PHOS 5.9* 5.5* 4.5 4.1 2.9    GFR: Estimated Creatinine Clearance: 19.6 mL/min (A) (by C-G formula based on SCr of 2.11 mg/dL (H)).  Liver Function Tests: Recent Labs  Lab 07/26/23 0343 07/27/23 0518 07/28/23 0548 07/29/23 0444 07/30/23 0433  AST  --  30  --   --   --   ALT  --  12  --   --   --   ALKPHOS  --  180*  --   --   --   BILITOT  --  1.1  --   --   --   PROT  --  6.9  --   --   --   ALBUMIN  3.6 3.4* 3.2* 3.2* 3.4*   No results for input(s): LIPASE, AMYLASE in the last 168 hours. Recent Labs  Lab  07/25/23 0036  AMMONIA 43*    Coagulation Profile: Recent Labs  Lab 07/24/23 2253 07/27/23 2333  INR 2.3* 2.4*    Cardiac Enzymes: No results for input(s): CKTOTAL, CKMB, CKMBINDEX, TROPONINI in the last 168 hours.  BNP (last 3 results) No results for input(s): PROBNP in the last 8760 hours.  Lipid Profile: No results for input(s): CHOL, HDL, LDLCALC, TRIG, CHOLHDL, LDLDIRECT in the last 72 hours.  Thyroid  Function Tests: No results for input(s): TSH, T4TOTAL, FREET4, T3FREE, THYROIDAB in the last 72 hours.   Anemia Panel: No results for input(s): VITAMINB12, FOLATE, FERRITIN, TIBC, IRON , RETICCTPCT in the last 72 hours.   Urine analysis:    Component Value Date/Time   COLORURINE YELLOW 04/17/2022 1514   APPEARANCEUR CLEAR 04/17/2022 1514   LABSPEC <1.005 (L) 04/17/2022 1514   LABSPEC 1.010 10/17/2015 0808   PHURINE 6.5 04/17/2022 1514   GLUCOSEU NEGATIVE 04/17/2022 1514   GLUCOSEU 500 10/17/2015 0808   HGBUR NEGATIVE 04/17/2022 1514   BILIRUBINUR NEGATIVE 04/17/2022 1514   BILIRUBINUR Negative 10/17/2015 0808   KETONESUR NEGATIVE 04/17/2022 1514   PROTEINUR NEGATIVE 04/17/2022 1514   UROBILINOGEN 0.2 10/17/2015 0808   NITRITE NEGATIVE 04/17/2022 1514   LEUKOCYTESUR NEGATIVE 04/17/2022 1514   LEUKOCYTESUR Negative 10/17/2015 0808    Sepsis Labs: Lactic Acid, Venous    Component Value Date/Time   LATICACIDVEN 1.7 07/24/2023 2253    MICROBIOLOGY: Recent Results (from the past 240 hours)  Resp panel by RT-PCR (RSV, Flu A&B, Covid) Anterior Nasal Swab     Status: Abnormal   Collection Time: 07/24/23  9:55 PM   Specimen: Anterior Nasal Swab  Result Value Ref Range Status   SARS Coronavirus 2 by RT PCR NEGATIVE NEGATIVE Final    Comment: (NOTE) SARS-CoV-2 target nucleic acids are NOT DETECTED.  The SARS-CoV-2 RNA is generally detectable in upper respiratory specimens during the acute phase of infection. The  lowest concentration of SARS-CoV-2 viral copies this assay can detect is 138 copies/mL. A negative result does not preclude SARS-Cov-2 infection and should not be used as the sole basis for treatment or other patient management decisions. A negative result may occur with  improper  specimen collection/handling, submission of specimen other than nasopharyngeal swab, presence of viral mutation(s) within the areas targeted by this assay, and inadequate number of viral copies(<138 copies/mL). A negative result must be combined with clinical observations, patient history, and epidemiological information. The expected result is Negative.  Fact Sheet for Patients:  bloggercourse.com  Fact Sheet for Healthcare Providers:  seriousbroker.it  This test is no t yet approved or cleared by the [place]  FDA and  has been authorized for detection and/or diagnosis of SARS-CoV-2 by FDA under an Emergency Use Authorization (EUA). This EUA will remain  in effect (meaning this test can be used) for the duration of the COVID-19 declaration under Section 564(b)(1) of the Act, 21 U.S.C.section 360bbb-3(b)(1), unless the authorization is terminated  or revoked sooner.       Influenza A by PCR POSITIVE (A) NEGATIVE Final   Influenza B by PCR NEGATIVE NEGATIVE Final    Comment: (NOTE) The Xpert Xpress SARS-CoV-2/FLU/RSV plus assay is intended as an aid in the diagnosis of influenza from Nasopharyngeal swab specimens and should not be used as a sole basis for treatment. Nasal washings and aspirates are unacceptable for Xpert Xpress SARS-CoV-2/FLU/RSV testing.  Fact Sheet for Patients: bloggercourse.com  Fact Sheet for Healthcare Providers: seriousbroker.it  This test is not yet approved or cleared by the [place]  FDA and has been authorized for detection and/or diagnosis of SARS-CoV-2 by FDA  under an Emergency Use Authorization (EUA). This EUA will remain in effect (meaning this test can be used) for the duration of the COVID-19 declaration under Section 564(b)(1) of the Act, 21 U.S.C. section 360bbb-3(b)(1), unless the authorization is terminated or revoked.     Resp Syncytial Virus by PCR NEGATIVE NEGATIVE Final    Comment: (NOTE) Fact Sheet for Patients: bloggercourse.com  Fact Sheet for Healthcare Providers: seriousbroker.it  This test is not yet approved or cleared by the [place]  FDA and has been authorized for detection and/or diagnosis of SARS-CoV-2 by FDA under an Emergency Use Authorization (EUA). This EUA will remain in effect (meaning this test can be used) for the duration of the COVID-19 declaration under Section 564(b)(1) of the Act, 21 U.S.C. section 360bbb-3(b)(1), unless the authorization is terminated or revoked.  Performed at [facility], [address] [place], [place] [address]   Blood Culture (routine x 2)     Status: None   Collection Time: 07/25/23 12:36 AM   Specimen: BLOOD  Result Value Ref Range Status   Specimen Description BLOOD BLOOD LEFT ARM  Final   Special Requests   Final    BOTTLES DRAWN AEROBIC AND ANAEROBIC Blood Culture adequate volume   Culture   Final    NO GROWTH 5 DAYS Performed at [facility], [address] [place], [place] [address]    Report Status 07/30/2023 FINAL  Final  Blood Culture (routine x 2)     Status: None   Collection Time: 07/25/23 12:36 AM   Specimen: BLOOD  Result Value Ref Range Status   Specimen Description BLOOD BLOOD LEFT HAND  Final   Special Requests   Final    BOTTLES DRAWN AEROBIC AND ANAEROBIC Blood Culture adequate volume   Culture   Final    NO GROWTH 5 DAYS Performed at [facility], [address], [place], [place] [address]    Report Status 07/30/2023 FINAL  Final  Culture, body fluid w Gram Stain-bottle     Status: None  (Preliminary result)   Collection Time: 07/27/23 12:12 PM  Specimen: Pleura  Result Value Ref Range Status   Specimen Description PLEURAL  Final   Special Requests NONE  Final   Culture   Final    NO GROWTH 3 DAYS Performed at [facility], [address], [place], [place] [address]    Report Status PENDING  Incomplete  Gram stain     Status: None   Collection Time: 07/27/23 12:12 PM   Specimen: Pleura  Result Value Ref Range Status   Specimen Description PLEURAL  Final   Special Requests NONE  Final   Gram Stain   Final    NO WBC SEEN NO ORGANISMS SEEN Performed at [facility], [address], [place], [place] [address]    Report Status 07/27/2023 FINAL  Final    RADIOLOGY STUDIES/RESULTS: CT CHEST WO CONTRAST Result Date: 07/30/2023 CLINICAL DATA:  Pneumonia, complications suspected. EXAM: CT CHEST WITHOUT CONTRAST TECHNIQUE: Multidetector CT imaging of the chest was performed following the standard protocol without IV contrast. RADIATION DOSE REDUCTION: This exam was performed according to the departmental dose-optimization program which includes automated exposure control, adjustment of the mA and/or kV according to patient size and/or use of iterative reconstruction technique. COMPARISON:  Chest radiograph dated 07/29/2023 and CT dated 08/10/2022. FINDINGS: Evaluation of this exam is limited in the absence of intravenous contrast. Cardiovascular: There is no cardiomegaly. No significant pericardial effusion. Left pectoral pacing device. There is advanced 3 vessel coronary vascular calcification and calcification of the mitral annulus. There is postsurgical changes of CABG. Advanced atherosclerotic calcification of the thoracic aorta. No aneurysmal dilatation. The central pulmonary arteries are grossly unremarkable on this noncontrast CT. Mediastinum/Nodes: No obvious hilar or mediastinal adenopathy. Evaluation however is limited in the absence of intravenous contrast and  due to consolidative changes of the right lung. The esophagus is grossly unremarkable. No mediastinal fluid collection. Right IJ catheter with tip in the right atrium. Lungs/Pleura: There is a somewhat loculated appearing moderate size right pleural effusion tracking into the fissures. There is compressive atelectasis of portions of the right upper, right lower and right middle lobes. Areas of airspace density at the right lung base and in the right upper lobe as well as several scattered clusters of ground-glass nodularity in the left lung concerning for pneumonia. There is slight thickening of the right lung pleural surface. And ill-defined area of increased density in the right lower lobe measures approximately 2.3 x 2.1 cm (95/3) and may represent pneumonia or proteinaceous content within the pleural effusion. However, underlying mass is not excluded. Additionally there is a 1.7 x 0.9 cm nodular density in the right apex which may represent scarring or a pulmonary nodule. There is no pneumothorax. The central airways are patent. Upper Abdomen: No acute abnormality. Musculoskeletal: Median sternotomy wires. No acute osseous pathology. IMPRESSION: 1. Moderate size loculated appearing right pleural effusion with partial compressive atelectasis of the right lung. 2. Findings likely represent multilobar pneumonia. Clinical correlation and follow-up to resolution recommended. 3. Ill-defined density at the right lung base may represent proteinaceous content or infiltrate. A mass/neoplasm is not excluded. Thoracentesis may provide additional diagnostic information if clinically indicated. 4. Focal area of scarring versus nodule in the right apex. Attention on follow-up imaging recommended. 5.  Aortic Atherosclerosis (ICD10-I70.0). Electronically Signed   By: Vanetta Chou M.D.   On: 07/30/2023 09:31   DG CHEST PORT 1 VIEW Result Date: 07/29/2023 CLINICAL DATA:  Follow-up pleural effusion EXAM: PORTABLE CHEST 1 VIEW  COMPARISON:  07/27/2023 FINDINGS:  Cardiac shadow is stable. Postsurgical changes are again seen. Pacing device and dialysis catheter are again noted. Right-sided pleural effusion is again seen slightly less than that noted on the prior exam. This may be related to the upright positioning. Left lung is clear. IMPRESSION: Right-sided pleural effusion slightly less than that seen on the prior exam although this may be positionally related. Electronically Signed   By: Oneil Devonshire M.D.   On: 07/29/2023 18:06     LOS: 5 days   Donalda Applebaum, MD  [facility]    To contact the attending provider between 7A-7P or the covering provider during after hours 7P-7A, please log into the web site [email or web address] and access using universal [place] password for that web site. If you do not have the password, please call the hospital operator.  07/30/2023, 11:04 AM

## 2023-07-30 NOTE — Progress Notes (Signed)
 [facility] Progress Note   Subjective:   [name] - sitting comfortably in chair but reports continued significant weakness. On MWF dialysis schedule while inpatient. Pt reports continued coughing and overall fatigue but states she feels much better overall. Denies CP, reports intermittent dyspnea but improved since last week. Husband voiced concerns about getting patient to outpatient dialysis once discharged due to her continued weakness.   Objective Vitals:   07/30/23 0130 07/30/23 0500 07/30/23 0800 07/30/23 1142  BP: (!) 129/41  (!) 109/41 (!) 88/54  Pulse: 79     Resp: 14     Temp: 98.8 F (37.1 C)  97.6 F (36.4 C)   TempSrc:   Oral   SpO2: 100%     Weight:  55.5 kg    Height:       Physical Exam General: Frail woman in NAD. Sleepy but alert and answering questions. 2L nasal O2 in place.  Heart:RRR, 4/6 murmur Lungs: CTA anteriorly. Able to speak in full sentences.  Abdomen: slightly distended, non tender Extremities: no LE edema Dialysis Access: right internal jugular TDC + maturing RUE AVF  Additional Objective Labs: Basic Metabolic Panel: Recent Labs  Lab 07/28/23 0548 07/29/23 0444 07/30/23 0433  NA 127* 128* 130*  K 4.3 4.0 3.7  CL 90* 90* 92*  CO2 23 24 23   GLUCOSE 129* 172* 169*  BUN 56* 68* 28*  CREATININE 4.10* 3.73* 2.11*  CALCIUM  9.5 9.3 9.5  PHOS 4.5 4.1 2.9   Liver Function Tests: Recent Labs  Lab 07/27/23 0518 07/28/23 0548 07/29/23 0444 07/30/23 0433  AST 30  --   --   --   ALT 12  --   --   --   ALKPHOS 180*  --   --   --   BILITOT 1.1  --   --   --   PROT 6.9  --   --   --   ALBUMIN  3.4* 3.2* 3.2* 3.4*   CBC: Recent Labs  Lab 07/27/23 0518 07/27/23 2333 07/28/23 0548 07/29/23 0444 07/30/23 0433  WBC 5.0 5.3 5.6 5.9 10.3  NEUTROABS 3.5  --  3.9 4.1 7.9*  HGB 11.3* 11.7* 11.6* 11.3* 12.4  HCT 34.6* 35.2* 34.1* 33.3* 36.5  MCV 95.3 94.4 92.7 91.7 91.3  PLT 82* 101* 94* 99*  130*   CBG: Recent Labs  Lab 07/29/23 1023 07/29/23 1643 07/29/23 2121 07/30/23 0817 07/30/23 1140  GLUCAP 105* 180* 168* 136* 130*    Studies/Results: CT CHEST WO CONTRAST Result Date: 07/30/2023 CLINICAL DATA:  Pneumonia, complications suspected. EXAM: CT CHEST WITHOUT CONTRAST TECHNIQUE: Multidetector CT imaging of the chest was performed following the standard protocol without IV contrast. RADIATION DOSE REDUCTION: This exam was performed according to the departmental dose-optimization program which includes automated exposure control, adjustment of the mA and/or kV according to patient size and/or use of iterative reconstruction technique. COMPARISON:  Chest radiograph dated 07/29/2023 and CT dated 08/10/2022. FINDINGS: Evaluation of this exam is limited in the absence of intravenous contrast. Cardiovascular: There is no cardiomegaly. No significant pericardial effusion. Left pectoral pacing device. There is advanced 3 vessel coronary vascular calcification and calcification of the mitral annulus. There is postsurgical changes of CABG. Advanced atherosclerotic calcification of the thoracic aorta. No aneurysmal dilatation. The central pulmonary arteries are grossly unremarkable on this noncontrast CT. Mediastinum/Nodes: No obvious hilar or mediastinal adenopathy. Evaluation however is limited in the absence of intravenous contrast and due to consolidative changes  of the right lung. The esophagus is grossly unremarkable. No mediastinal fluid collection. Right IJ catheter with tip in the right atrium. Lungs/Pleura: There is a somewhat loculated appearing moderate size right pleural effusion tracking into the fissures. There is compressive atelectasis of portions of the right upper, right lower and right middle lobes. Areas of airspace density at the right lung base and in the right upper lobe as well as several scattered clusters of ground-glass nodularity in the left lung concerning for pneumonia.  There is slight thickening of the right lung pleural surface. And ill-defined area of increased density in the right lower lobe measures approximately 2.3 x 2.1 cm (95/3) and may represent pneumonia or proteinaceous content within the pleural effusion. However, underlying mass is not excluded. Additionally there is a 1.7 x 0.9 cm nodular density in the right apex which may represent scarring or a pulmonary nodule. There is no pneumothorax. The central airways are patent. Upper Abdomen: No acute abnormality. Musculoskeletal: Median sternotomy wires. No acute osseous pathology. IMPRESSION: 1. Moderate size loculated appearing right pleural effusion with partial compressive atelectasis of the right lung. 2. Findings likely represent multilobar pneumonia. Clinical correlation and follow-up to resolution recommended. 3. Ill-defined density at the right lung base may represent proteinaceous content or infiltrate. A mass/neoplasm is not excluded. Thoracentesis may provide additional diagnostic information if clinically indicated. 4. Focal area of scarring versus nodule in the right apex. Attention on follow-up imaging recommended. 5.  Aortic Atherosclerosis (ICD10-I70.0). Electronically Signed   By: [name] [name] M.D.   On: 07/30/2023 09:31   DG CHEST PORT 1 VIEW Result Date: 07/29/2023 CLINICAL DATA:  Follow-up pleural effusion EXAM: PORTABLE CHEST 1 VIEW COMPARISON:  07/27/2023 FINDINGS: Cardiac shadow is stable. Postsurgical changes are again seen. Pacing device and dialysis catheter are again noted. Right-sided pleural effusion is again seen slightly less than that noted on the prior exam. This may be related to the upright positioning. Left lung is clear. IMPRESSION: Right-sided pleural effusion slightly less than that seen on the prior exam although this may be positionally related. Electronically Signed   By: [name] [name] M.D.   On: 07/29/2023 18:06   Medications:   (feeding supplement) PROSource Plus  30 mL  Oral BID BM   apixaban   5 mg Oral BID   aspirin  EC  81 mg Oral Daily   benzonatate   200 mg Oral TID   Chlorhexidine  Gluconate Cloth  6 each Topical Q0600   dextromethorphan   30 mg Oral QHS   escitalopram   5 mg Oral QHS   famotidine   10 mg Oral QHS   fluticasone   2 spray Each Nare Daily   gabapentin   100 mg Oral Daily   insulin  glargine-yfgn  6 Units Subcutaneous Daily   insulin  glulisine  0-6 Units Subcutaneous TID with meals   lactulose   20 g Oral BID   metoprolol  succinate  12.5 mg Oral Daily   midodrine   5 mg Oral Once per day on Sunday Tuesday Thursday Saturday   sodium chloride  flush  3 mL Intravenous Q12H     Dialysis Orders Home HD (in training) MTTF, EDW 52kg, 3hrs, 2k, currently on IV iron     Assessment/Plan: Influenza A + PNA: Symptoms improving. Cough and intermittent dyspnea continue. S/p course of Levaquin . Husband concerned about weakness and transporting her to outpatient dialysis. Case worker notified. AHRF: Due to #1 and loculated pleural effusion, s/p thora 1/4 - Cx negative.  AMS/delirium: Improving, alert and oriented today. ESRD: Recently started home  HD training, follow MWF schedule here - if patient is discharged tomorrow, will hold off on dialysis and resume outpatient schedule with Thursday and Friday sessions.  HTN/volume: On midodrine . Mild hypoNa - UF as tolerated. 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 Phos low likely due to decreased nutrition. Not on binders.   Nutrition: Alb low, continue supps. CAD/Hx CABG/HFrEF T1DM  Signe Sick, PA-S Izetta Boehringer, PA-C 07/30/2023, 1:02 PM  [employer]

## 2023-07-30 NOTE — Progress Notes (Signed)
 07/30/2023   I have seen and evaluated the patient for effusion.   S:  Patient overall continues to improve.   O: Blood pressure 96/66, pulse 64, temperature 97.6 F (36.4 C), temperature source Oral, resp. rate 17, height 5' 3 (1.6 m), weight 81.6 kg, SpO2 94 %.    No distress Lungs diminished R Tunneled R HD catheter noted +muscle wasting Sats good on 1LPM   A:  Chronic loculated R effusion without prior benefit to thora: fluid appears benign. Abnormal L lung on imaging almost looks like emboli Influenza infection P:  Check echo, Pct; trend fever curve; if both neg just need f/u imaging in about 6 weeks No further intervention needed on R pleural effusion Will touch base with primary tomorrow after above tests done     Rolan Sharps MD [facility] Prefer epic messenger for cross cover needs If after hours, please call E-link

## 2023-07-30 NOTE — TOC Progression Note (Signed)
 Transition of Care [facility]) - Progression Note    Patient Details  Name: TRINNA KUNST MRN: [id number] Date of Birth: [birth year]  [facility] CM/SW Contact  Inocente GORMAN Kindle, LCSW Phone Number: 07/30/2023, 2:40 PM  Clinical Narrative:    CSW placed transportation resources on AVS for follow up.         Expected Discharge Plan and Services    Home Health                                           Social Determinants of Health (SDOH) Interventions SDOH Screenings   Food Insecurity: No Food Insecurity (07/26/2023)  Housing: Low Risk  (07/26/2023)  Transportation Needs: No Transportation Needs (07/26/2023)  Utilities: Not At Risk (07/26/2023)  Depression (PHQ2-9): Low Risk  (09/08/2018)  Financial Resource Strain: Low Risk  (01/02/2023)   Received from [facility], [facility]  Physical Activity: Insufficiently Active (03/17/2021)   Received from [facility]  Social Connections: Socially Isolated (07/26/2023)  Stress: Stress Concern Present (03/17/2021)   Received from [facility], [facility]  Tobacco Use: Low Risk  (07/29/2023)    Readmission Risk Interventions     No data to display

## 2023-07-30 NOTE — Progress Notes (Signed)
 Pt had 1 episode of bloody stool. MD Ghimire notified. CBC, type and screen ordered. Will continue to monitor

## 2023-07-31 ENCOUNTER — Ambulatory Visit: Payer: Medicare PPO | Admitting: Internal Medicine

## 2023-07-31 ENCOUNTER — Telehealth: Payer: Self-pay | Admitting: Internal Medicine

## 2023-07-31 DIAGNOSIS — A419 Sepsis, unspecified organism: Secondary | ICD-10-CM

## 2023-07-31 DIAGNOSIS — G9341 Metabolic encephalopathy: Secondary | ICD-10-CM

## 2023-07-31 DIAGNOSIS — J101 Influenza due to other identified influenza virus with other respiratory manifestations: Secondary | ICD-10-CM | POA: Diagnosis not present

## 2023-07-31 DIAGNOSIS — I480 Paroxysmal atrial fibrillation: Secondary | ICD-10-CM

## 2023-07-31 DIAGNOSIS — N186 End stage renal disease: Secondary | ICD-10-CM | POA: Diagnosis not present

## 2023-07-31 DIAGNOSIS — K625 Hemorrhage of anus and rectum: Secondary | ICD-10-CM | POA: Diagnosis not present

## 2023-07-31 DIAGNOSIS — K7460 Unspecified cirrhosis of liver: Secondary | ICD-10-CM

## 2023-07-31 DIAGNOSIS — K921 Melena: Secondary | ICD-10-CM

## 2023-07-31 DIAGNOSIS — Z992 Dependence on renal dialysis: Secondary | ICD-10-CM

## 2023-07-31 DIAGNOSIS — J90 Pleural effusion, not elsewhere classified: Secondary | ICD-10-CM | POA: Diagnosis not present

## 2023-07-31 DIAGNOSIS — K7581 Nonalcoholic steatohepatitis (NASH): Secondary | ICD-10-CM

## 2023-07-31 LAB — RENAL FUNCTION PANEL
Albumin: 3 g/dL — ABNORMAL LOW (ref 3.5–5.0)
Anion gap: 13 (ref 5–15)
BUN: 52 mg/dL — ABNORMAL HIGH (ref 8–23)
CO2: 22 mmol/L (ref 22–32)
Calcium: 9.2 mg/dL (ref 8.9–10.3)
Chloride: 91 mmol/L — ABNORMAL LOW (ref 98–111)
Creatinine, Ser: 3.52 mg/dL — ABNORMAL HIGH (ref 0.44–1.00)
GFR, Estimated: 13 mL/min — ABNORMAL LOW (ref >60)
Glucose, Bld: 174 mg/dL — ABNORMAL HIGH (ref 70–99)
Phosphorus: 4 mg/dL (ref 2.5–4.6)
Potassium: 3.8 mmol/L (ref 3.5–5.1)
Sodium: 126 mmol/L — ABNORMAL LOW (ref 135–145)

## 2023-07-31 LAB — GLUCOSE, CAPILLARY
Glucose-Capillary: 197 mg/dL — ABNORMAL HIGH (ref 70–99)
Glucose-Capillary: 152 mg/dL — ABNORMAL HIGH (ref 70–99)
Glucose-Capillary: 197 mg/dL — ABNORMAL HIGH (ref 70–99)
Glucose-Capillary: 216 mg/dL — ABNORMAL HIGH (ref 70–99)

## 2023-07-31 LAB — CBC WITH DIFFERENTIAL/PLATELET
Abs Immature Granulocytes: 0.04 10*3/uL (ref 0.00–0.07)
Basophils Absolute: 0.1 10*3/uL (ref 0.0–0.1)
Basophils Relative: 1 %
Eosinophils Absolute: 0 10*3/uL (ref 0.0–0.5)
Eosinophils Relative: 0 %
HCT: 33.2 % — ABNORMAL LOW (ref 36.0–46.0)
Hemoglobin: 11.3 g/dL — ABNORMAL LOW (ref 12.0–15.0)
Immature Granulocytes: 0 %
Lymphocytes Relative: 15 %
Lymphs Abs: 1.5 10*3/uL (ref 0.7–4.0)
MCH: 31 pg (ref 26.0–34.0)
MCHC: 34 g/dL (ref 30.0–36.0)
MCV: 91.2 fL (ref 80.0–100.0)
Monocytes Absolute: 1.3 10*3/uL — ABNORMAL HIGH (ref 0.1–1.0)
Monocytes Relative: 13 %
Neutro Abs: 7.1 10*3/uL (ref 1.7–7.7)
Neutrophils Relative %: 71 %
Platelets: 128 10*3/uL — ABNORMAL LOW (ref 150–400)
RBC: 3.64 MIL/uL — ABNORMAL LOW (ref 3.87–5.11)
RDW: 14.2 % (ref 11.5–15.5)
WBC: 10 10*3/uL (ref 4.0–10.5)
nRBC: 0 % (ref 0.0–0.2)

## 2023-07-31 MED ORDER — HEPARIN SODIUM (PORCINE) 1000 UNIT/ML IJ SOLN
3800.0000 [IU] | Freq: Once | INTRAMUSCULAR | Status: AC
  Administered 2023-07-31: 3800 [IU]
  Filled 2023-07-31: qty 4

## 2023-07-31 MED ORDER — APIXABAN 5 MG PO TABS
5.0000 mg | ORAL_TABLET | Freq: Two times a day (BID) | ORAL | Status: DC
  Administered 2023-07-31 – 2023-08-01 (×3): 5 mg via ORAL
  Filled 2023-07-31 (×3): qty 1

## 2023-07-31 MED ORDER — ALBUMIN HUMAN 25 % IV SOLN
25.0000 g | Freq: Once | INTRAVENOUS | Status: AC
  Administered 2023-07-31: 25 g via INTRAVENOUS
  Filled 2023-07-31: qty 100

## 2023-07-31 MED ORDER — ANTICOAGULANT SODIUM CITRATE 4% (200MG/5ML) IV SOLN: 5.0000 mL | Status: DC | PRN

## 2023-07-31 MED ORDER — ALTEPLASE 2 MG IJ SOLR: 2.0000 mg | Freq: Once | INTRAMUSCULAR | Status: DC | PRN

## 2023-07-31 MED ORDER — PANTOPRAZOLE SODIUM 40 MG IV SOLR
40.0000 mg | Freq: Two times a day (BID) | INTRAVENOUS | Status: DC
  Administered 2023-07-31 – 2023-08-01 (×3): 40 mg via INTRAVENOUS
  Filled 2023-07-31 (×3): qty 10

## 2023-07-31 MED ORDER — PENTAFLUOROPROP-TETRAFLUOROETH EX AERO: 1.0000 | INHALATION_SPRAY | CUTANEOUS | Status: DC | PRN

## 2023-07-31 MED ORDER — LIDOCAINE HCL (PF) 1 % IJ SOLN: 5.0000 mL | INTRAMUSCULAR | Status: DC | PRN

## 2023-07-31 MED ORDER — LIDOCAINE-PRILOCAINE 2.5-2.5 % EX CREA: 1.0000 | TOPICAL_CREAM | CUTANEOUS | Status: DC | PRN

## 2023-07-31 MED ORDER — HEPARIN SODIUM (PORCINE) 1000 UNIT/ML DIALYSIS: 1000.0000 [IU] | INTRAMUSCULAR | Status: DC | PRN

## 2023-07-31 NOTE — Progress Notes (Signed)
 Physical Therapy Treatment Patient Details Name: [name] [name] MRN: [id number] DOB: 08-Feb-1951 Today's Date: 07/31/2023   History of Present Illness Patient is a 73 yo female presenting to [facility] on 07/24/23 with weakness and cough, transferred to [facility] on 07/26/23 found to have encephalopathy in the setting of influenza infection. PMH includes: aortic stenosis, CHF, chronic kidney disease, diabetes, CAD    PT Comments  Pt received in supine and agreeable to transfer to the recliner to eat lunch. Pt reports increased fatigue from HD this morning, however is more alert than yesterday. Pt able to sit to EOB and step pivot to recliner with up to min A due to weakness and impaired balance. Pt requires increased time to sit to EOB and scoot back in recliner due to fatigue and DOE, but SpO2 > 91% on RA throughout session. Pt continues to benefit from PT services to progress toward functional mobility goals.     If plan is discharge home, recommend the following: A little help with walking and/or transfers;A little help with bathing/dressing/bathroom;Assistance with cooking/housework;Assist for transportation;Help with stairs or ramp for entrance;Supervision due to cognitive status   Can travel by private vehicle        Equipment Recommendations  None recommended by PT    Recommendations for Other Services       Precautions / Restrictions Precautions Precautions: Fall Precaution Comments: monitor O2 Restrictions Weight Bearing Restrictions Per Provider Order: No     Mobility  Bed Mobility Overal bed mobility: Needs Assistance Bed Mobility: Supine to Sit     Supine to sit: Min assist, HOB elevated, Used rails     General bed mobility comments: cues for technique and use of bedrail. Min A for trunk elevation. Mod A to scoot towards EOB with bedpad.    Transfers Overall transfer level: Needs assistance Equipment used: 1 person hand held assist Transfers: Sit to/from Stand, Bed to  chair/wheelchair/BSC Sit to Stand: Min assist   Step pivot transfers: Min assist       General transfer comment: STS from EOB with min A for anterior weight shift. Min A for balance and stability during pivot to recliner    Ambulation/Gait               General Gait Details: Pt declines further ambulation due to fatigue       Balance Overall balance assessment: Needs assistance Sitting-balance support: No upper extremity supported, Feet supported Sitting balance-Leahy Scale: Good Sitting balance - Comments: sitting EOB   Standing balance support: Single extremity supported, During functional activity Standing balance-Leahy Scale: Poor Standing balance comment: with HHA for balance                            Cognition Arousal: Alert Behavior During Therapy: WFL for tasks assessed/performed Overall Cognitive Status: Impaired/Different from baseline                                          Exercises      General Comments General comments (skin integrity, edema, etc.): SpO2 91% on RA during mobility      Pertinent Vitals/Pain Pain Assessment Pain Assessment: Faces Faces Pain Scale: Hurts a little bit Pain Location: R thigh Pain Descriptors / Indicators: Aching Pain Intervention(s): Monitored during session     PT Goals (current goals can now be found in  the care plan section) Acute Rehab PT Goals Patient Stated Goal: get well PT Goal Formulation: With patient/family Time For Goal Achievement: 08/10/23 Progress towards PT goals: Progressing toward goals    Frequency    Min 1X/week       AM-PAC PT 6 Clicks Mobility   Outcome Measure  Help needed turning from your back to your side while in a flat bed without using bedrails?: None Help needed moving from lying on your back to sitting on the side of a flat bed without using bedrails?: A Little Help needed moving to and from a bed to a chair (including a wheelchair)?:  A Little Help needed standing up from a chair using your arms (e.g., wheelchair or bedside chair)?: A Little Help needed to walk in hospital room?: A Little Help needed climbing 3-5 steps with a railing? : A Little 6 Click Score: 19    End of Session   Activity Tolerance: Patient limited by fatigue Patient left: in chair;with call bell/phone within reach;with family/visitor present Nurse Communication: Mobility status PT Visit Diagnosis: Unsteadiness on feet (R26.81);Other abnormalities of gait and mobility (R26.89);Muscle weakness (generalized) (M62.81);Other symptoms and signs involving the nervous system (R29.898)     Time: 8681-8667 PT Time Calculation (min) (ACUTE ONLY): 14 min  Charges:    $Therapeutic Activity: 8-22 mins PT General Charges $$ ACUTE PT VISIT: 1 Visit                    Darryle George, PTA Acute Rehabilitation Services Secure Chat Preferred  [phone]    Darryle George 07/31/2023, 2:17 PM

## 2023-07-31 NOTE — Telephone Encounter (Signed)
 6-8 week f/u with any for abnormal chest CT scan and hypoxemia during flu; can arrange timing of f/u CT at this visit.  Thanks

## 2023-07-31 NOTE — Plan of Care (Signed)
  Problem: Health Behavior/Discharge Planning: Goal: Ability to manage health-related needs will improve Outcome: Progressing   Problem: Metabolic: Goal: Ability to maintain appropriate glucose levels will improve Outcome: Progressing   Problem: Nutritional: Goal: Maintenance of adequate nutrition will improve Outcome: Progressing   Problem: Clinical Measurements: Goal: Ability to maintain clinical measurements within normal limits will improve Outcome: Progressing

## 2023-07-31 NOTE — Progress Notes (Signed)
 Received patient in bed to unit.  Alert and oriented.  Informed consent signed and in chart.   TX duration: 3 hours  Patient tolerated well.  Transported back to the room  Alert, without acute distress.  Hand-off given to patient's nurse.   Access used: R internal jugular HD Cath Access issues: none  Total UF removed: 1.9L Medication(s) given: 100cc bolus, midodrine , albumin , Tylenol    07/31/23 1154  Vitals  Temp (!) 97.1 F (36.2 C)  Temp Source Axillary  BP 98/67  MAP (mmHg) 77  BP Location Left Arm  BP Method Automatic  Patient Position (if appropriate) Lying  Pulse Rate 92  ECG Heart Rate (!) 120  Resp 18  Oxygen Therapy  SpO2 98 %  O2 Device Nasal Cannula  O2 Flow Rate (L/min) 1 L/min  During Treatment Monitoring  Duration of HD Treatment -hour(s) 3 hour(s)  HD Safety Checks Performed Yes  Intra-Hemodialysis Comments Tx completed  Dialysis Fluid Bolus Normal Saline  Bolus Amount (mL) 300 mL  Post Treatment  Dialyzer Clearance Clear  Liters Processed 72  Fluid Removed (mL) 1900 mL  Tolerated HD Treatment Yes  Hemodialysis Catheter Right Internal jugular Double lumen Permanent (Tunneled)  Placement Date/Time: 05/01/23 0948   Placed prior to admission: No  Serial / Lot #: [id number]  Expiration Date: 05/23/27  Time Out: Correct patient;Correct site;Correct procedure  Maximum sterile barrier precautions: Hand hygiene;Cap;Mask;Sterile gown...  Site Condition No complications  Blue Lumen Status Flushed;Heparin  locked;Dead end cap in place  Red Lumen Status Flushed;Heparin  locked;Dead end cap in place  Purple Lumen Status N/A  Catheter fill solution Heparin  1000 units/ml  Catheter fill volume (Arterial) 1.9 cc  Catheter fill volume (Venous) 1.9  Dressing Type Transparent  Dressing Status Antimicrobial disc in place;Clean, Dry, Intact  Interventions Other (Comment) (deaccessed)  Drainage Description None  Dressing Change Due 08/02/23  Post treatment catheter  status Capped and Clamped     Camellia Brasil LPN [facility]

## 2023-07-31 NOTE — Progress Notes (Addendum)
 PROGRESS NOTE        PATIENT DETAILS Name: [name] [name] Age: 73 y.o. Sex: female Date of Birth: 08-08-1950 Admit Date: 07/24/2023 Admitting Physician Dorn Dawson, MD ERE:Yjhozm, Vernell, MD  Brief Summary: Patient is a 73 y.o.  female with history of ESRD on HD M/T/TH/F, DM-1 on insulin  pump, CAD s/p CABG x 2, s/p bioprosthetic AVR, A-fib on Eliquis , s/p PPM in place, RCC-s/p right nephrectomy, remote history of lymphoma-presented to [facility] on 1/1 with weakness/cough-found to have encephalopathy in the setting of influenza infection.  Significant events: 1/1>> admit to hospitalist service at [facility] 1/3>> worsening hypoxemia after vomiting-possible aspiration event 1/3>> transferred to [facility] 1/7>> hematochezia x 2  Significant studies: 1/1>> CXR: Loculated-moderate right-sided pleural effusion.  Diffuse hazy airspace disease right lung. 1/2>> CT head: No acute intracranial abnormality 1/8>> echo: EF 70-75%, bioprosthetic aortic valve in place.  Significant microbiology data: 1/1>> influenza A PCR: Positive 1/1>> COVID/influenza B/RSV PCR: Negative 1/2>> COVID PCR: Negative 1/4>> pleural fluid culture: No growth  Procedures: 1/4>> thoracocentesis by IR  Consults: Nephrology PCCM Gastroenterology  Subjective: Frail-but no new complaints-had hematochezia x 2-yesterday evening and overnight.  None this morning.  On room air with O2 saturations in the low 90s.  Objective: Vitals: Blood pressure (!) 107/95, pulse (!) 112, temperature (!) 97.1 F (36.2 C), temperature source Axillary, resp. rate 16, height 5' 2 (1.575 m), weight 47.7 kg, SpO2 99%.   Exam: Gen Exam:Alert awake-not in any distress HEENT:atraumatic, normocephalic Chest: B/L clear to auscultation anteriorly CVS:S1S2 regular Abdomen:soft non tender, non distended Extremities:no edema Neurology: Non focal Skin: no rash  Pertinent Labs/Radiology:    Latest Ref Rng & Units 07/31/2023     4:54 AM 07/30/2023    3:30 PM 07/30/2023    4:33 AM  CBC  WBC 4.0 - 10.5 K/uL 10.0  7.8  10.3   Hemoglobin 12.0 - 15.0 g/dL 88.6  88.5  87.5   Hematocrit 36.0 - 46.0 % 33.2  33.7  36.5   Platelets 150 - 400 K/uL 128  120  130     Lab Results  Component Value Date   NA 126 (L) 07/31/2023   K 3.8 07/31/2023   CL 91 (L) 07/31/2023   CO2 22 07/31/2023      Assessment/Plan: Acute metabolic encephalopathy Secondary to influenza infection Resolved with supportive care  Acute hypoxic respiratory failure secondary to influenza A PNA-possible aspiration PNA Hypoxia has improved-hide on room air now requiring minimal amount of oxygen. Completed course of levofloxacin  She previously has been intolerant to Tamiflu (per pt-has had nausea/vomiting on prior 2 attempts)  Sepsis secondary to influenza/PNA Improved Cultures as above Completed a course of levofloxacin .  Loculated right-sided pleural effusion Not a new finding-present on most recent PET scan October 2024 Underwent thoracocentesis 1/4-consistent with exudate-cytology pending PCCM suspecting trapped lung Recommendations from Dr. Marsha to repeat CT imaging in 8 weeks-his office will arrange for follow-up.  PAF with RVR Triggered by PNA/influenza Maintaining sinus rhythm Continue metoprolol  Eliquis  on hold for hematochezia. Telemetry monitoring  Nausea/vomiting-aspiration event on 1/3 Following a coughing spell No further nausea/vomiting  Episode of epistaxis on 1/4 Resolved with Afrin-no epistaxis this morning.  Hematochezia x 2 on 1/7 Hb relatively stable Eliquis  held Given her multiple medical issues-need for anticoagulation-will get GI opinion for colonoscopy/endoscopy evaluation.  ESRD on HD M/T/T/F Nephrology following  Chronic HFpEF Euvolemic Volume removal with HD  Hyponatremia Managed with volume removal with HD.  Sick sinus syndrome-s/p PPM implantation Telemetry monitoring  CAD s/p  CABG x 2 (2005/2011) and bioprosthetic AVR 2011 ASA held due to hematochezia Continue metoprolol . Resume Repatha  injections postdischarge  HTN BP stable on metoprolol  Resume Imdur  when BP is a bit more stable.  DM-1 (A1c 6.7 on 1/1) CBG stable Semglee  6 units + SSI Patient not keen on going back on insulin  pump-while inpatient for diabetic coordinator on 1/6-plan is to resume it on discharge.  Recent Labs    07/30/23 1621 07/30/23 2016 07/31/23 0734  GLUCAP 146* 212* 197*    NASH liver cirrhoses Compensated Follows with [facility]  GERD Pepcid   Mood disorder Stable Xanax /Lexapro   Restless leg/peripheral neuropathy Continue Neurontin   History of Hodgkin's lymphoma S/p mantle radiation 1981-in remission since then Follows with oncology  Renal cell carcinoma-s/p right nephrectomy 2008 Follows with oncology  Left breast DCIS-s/p left mastectomy 2016 Follows with oncology  Debility/deconditioning PT/OT eval ordered-Home health recommended.  OSA BiPAP  Underweight: Estimated body mass index is 19.23 kg/m as calculated from the following:   Height as of this encounter: 5' 2 (1.575 m).   Weight as of this encounter: 47.7 kg.   Code status:   Code Status: Full Code   DVT Prophylaxis:    Family Communication: Daughter at bedside   Disposition Plan: Status is: Inpatient Remains inpatient appropriate because: Severity of illness   Planned Discharge Destination:Home   Diet: Diet Order             Diet Carb Modified Fluid consistency: Thin; Room service appropriate? Yes  Diet effective now                     Antimicrobial agents: Anti-infectives (From admission, onward)    Start     Dose/Rate Route Frequency Ordered Stop   07/27/23 1045  fluconazole  (DIFLUCAN ) tablet 150 mg        150 mg Oral  Once 07/27/23 0951 07/27/23 1740   07/27/23 0800  levofloxacin  (LEVAQUIN ) tablet 500 mg        500 mg Oral Every 48 hours 07/25/23 0329 07/29/23  1035   07/25/23 0330  levofloxacin  (LEVAQUIN ) tablet 750 mg        750 mg Oral  Once 07/25/23 0329 07/25/23 0400        MEDICATIONS: Scheduled Meds:  (feeding supplement) PROSource Plus  30 mL Oral BID BM   benzonatate   200 mg Oral TID   Chlorhexidine  Gluconate Cloth  6 each Topical Q0600   dextromethorphan   30 mg Oral QHS   escitalopram   5 mg Oral QHS   famotidine   10 mg Oral QHS   fluticasone   2 spray Each Nare Daily   gabapentin   100 mg Oral Daily   insulin  glargine-yfgn  6 Units Subcutaneous Daily   insulin  glulisine  0-6 Units Subcutaneous TID with meals   lactulose   20 g Oral BID   metoprolol  succinate  12.5 mg Oral Daily   midodrine   5 mg Oral Once per day on Sunday Tuesday Thursday Saturday   pantoprazole  (PROTONIX ) IV  40 mg Intravenous Q12H   sodium chloride  flush  3 mL Intravenous Q12H   Continuous Infusions:  anticoagulant sodium citrate      PRN Meds:.acetaminophen , ALPRAZolam , alteplase , anticoagulant sodium citrate , heparin , levalbuterol , lidocaine  (PF), lidocaine -prilocaine , ondansetron  (ZOFRAN ) IV, mouth rinse, pentafluoroprop-tetrafluoroeth, sodium chloride    I have personally reviewed following labs and  imaging studies  LABORATORY DATA: CBC: Recent Labs  Lab 07/27/23 0518 07/27/23 2333 07/28/23 0548 07/29/23 0444 07/30/23 0433 07/30/23 1530 07/31/23 0454  WBC 5.0   < > 5.6 5.9 10.3 7.8 10.0  NEUTROABS 3.5  --  3.9 4.1 7.9*  --  7.1  HGB 11.3*   < > 11.6* 11.3* 12.4 11.4* 11.3*  HCT 34.6*   < > 34.1* 33.3* 36.5 33.7* 33.2*  MCV 95.3   < > 92.7 91.7 91.3 91.6 91.2  PLT 82*   < > 94* 99* 130* 120* 128*   < > = values in this interval not displayed.    Basic Metabolic Panel: Recent Labs  Lab 07/27/23 0518 07/28/23 0548 07/29/23 0444 07/30/23 0433 07/31/23 0454  NA 131* 127* 128* 130* 126*  K 4.0 4.3 4.0 3.7 3.8  CL 92* 90* 90* 92* 91*  CO2 25 23 24 23 22   GLUCOSE 140* 129* 172* 169* 174*  BUN 40* 56* 68* 28* 52*  CREATININE 3.86* 4.10*  3.73* 2.11* 3.52*  CALCIUM  9.7 9.5 9.3 9.5 9.2  PHOS 5.5* 4.5 4.1 2.9 4.0    GFR: Estimated Creatinine Clearance: 11.2 mL/min (A) (by C-G formula based on SCr of 3.52 mg/dL (H)).  Liver Function Tests: Recent Labs  Lab 07/27/23 0518 07/28/23 0548 07/29/23 0444 07/30/23 0433 07/31/23 0454  AST 30  --   --   --   --   ALT 12  --   --   --   --   ALKPHOS 180*  --   --   --   --   BILITOT 1.1  --   --   --   --   PROT 6.9  --   --   --   --   ALBUMIN  3.4* 3.2* 3.2* 3.4* 3.0*   No results for input(s): LIPASE, AMYLASE in the last 168 hours. Recent Labs  Lab 07/25/23 0036  AMMONIA 43*    Coagulation Profile: Recent Labs  Lab 07/24/23 2253 07/27/23 2333  INR 2.3* 2.4*    Cardiac Enzymes: No results for input(s): CKTOTAL, CKMB, CKMBINDEX, TROPONINI in the last 168 hours.  BNP (last 3 results) No results for input(s): PROBNP in the last 8760 hours.  Lipid Profile: No results for input(s): CHOL, HDL, LDLCALC, TRIG, CHOLHDL, LDLDIRECT in the last 72 hours.  Thyroid  Function Tests: No results for input(s): TSH, T4TOTAL, FREET4, T3FREE, THYROIDAB in the last 72 hours.   Anemia Panel: No results for input(s): VITAMINB12, FOLATE, FERRITIN, TIBC, IRON , RETICCTPCT in the last 72 hours.   Urine analysis:    Component Value Date/Time   COLORURINE YELLOW 04/17/2022 1514   APPEARANCEUR CLEAR 04/17/2022 1514   LABSPEC <1.005 (L) 04/17/2022 1514   LABSPEC 1.010 10/17/2015 0808   PHURINE 6.5 04/17/2022 1514   GLUCOSEU NEGATIVE 04/17/2022 1514   GLUCOSEU 500 10/17/2015 0808   HGBUR NEGATIVE 04/17/2022 1514   BILIRUBINUR NEGATIVE 04/17/2022 1514   BILIRUBINUR Negative 10/17/2015 0808   KETONESUR NEGATIVE 04/17/2022 1514   PROTEINUR NEGATIVE 04/17/2022 1514   UROBILINOGEN 0.2 10/17/2015 0808   NITRITE NEGATIVE 04/17/2022 1514   LEUKOCYTESUR NEGATIVE 04/17/2022 1514   LEUKOCYTESUR Negative 10/17/2015 0808    Sepsis  Labs: Lactic Acid, Venous    Component Value Date/Time   LATICACIDVEN 1.7 07/24/2023 2253    MICROBIOLOGY: Recent Results (from the past 240 hours)  Resp panel by RT-PCR (RSV, Flu A&B, Covid) Anterior Nasal Swab     Status: Abnormal   Collection Time:  07/24/23  9:55 PM   Specimen: Anterior Nasal Swab  Result Value Ref Range Status   SARS Coronavirus 2 by RT PCR NEGATIVE NEGATIVE Final    Comment: (NOTE) SARS-CoV-2 target nucleic acids are NOT DETECTED.  The SARS-CoV-2 RNA is generally detectable in upper respiratory specimens during the acute phase of infection. The lowest concentration of SARS-CoV-2 viral copies this assay can detect is 138 copies/mL. A negative result does not preclude SARS-Cov-2 infection and should not be used as the sole basis for treatment or other patient management decisions. A negative result may occur with  improper specimen collection/handling, submission of specimen other than nasopharyngeal swab, presence of viral mutation(s) within the areas targeted by this assay, and inadequate number of viral copies(<138 copies/mL). A negative result must be combined with clinical observations, patient history, and epidemiological information. The expected result is Negative.  Fact Sheet for Patients:  bloggercourse.com  Fact Sheet for Healthcare Providers:  seriousbroker.it  This test is no t yet approved or cleared by the [place]  FDA and  has been authorized for detection and/or diagnosis of SARS-CoV-2 by FDA under an Emergency Use Authorization (EUA). This EUA will remain  in effect (meaning this test can be used) for the duration of the COVID-19 declaration under Section 564(b)(1) of the Act, 21 U.S.C.section 360bbb-3(b)(1), unless the authorization is terminated  or revoked sooner.       Influenza A by PCR POSITIVE (A) NEGATIVE Final   Influenza B by PCR NEGATIVE NEGATIVE Final    Comment:  (NOTE) The Xpert Xpress SARS-CoV-2/FLU/RSV plus assay is intended as an aid in the diagnosis of influenza from Nasopharyngeal swab specimens and should not be used as a sole basis for treatment. Nasal washings and aspirates are unacceptable for Xpert Xpress SARS-CoV-2/FLU/RSV testing.  Fact Sheet for Patients: bloggercourse.com  Fact Sheet for Healthcare Providers: seriousbroker.it  This test is not yet approved or cleared by the [place]  FDA and has been authorized for detection and/or diagnosis of SARS-CoV-2 by FDA under an Emergency Use Authorization (EUA). This EUA will remain in effect (meaning this test can be used) for the duration of the COVID-19 declaration under Section 564(b)(1) of the Act, 21 U.S.C. section 360bbb-3(b)(1), unless the authorization is terminated or revoked.     Resp Syncytial Virus by PCR NEGATIVE NEGATIVE Final    Comment: (NOTE) Fact Sheet for Patients: bloggercourse.com  Fact Sheet for Healthcare Providers: seriousbroker.it  This test is not yet approved or cleared by the [place]  FDA and has been authorized for detection and/or diagnosis of SARS-CoV-2 by FDA under an Emergency Use Authorization (EUA). This EUA will remain in effect (meaning this test can be used) for the duration of the COVID-19 declaration under Section 564(b)(1) of the Act, 21 U.S.C. section 360bbb-3(b)(1), unless the authorization is terminated or revoked.  Performed at [facility], [address], [place], [place] [address]   Blood Culture (routine x 2)     Status: None   Collection Time: 07/25/23 12:36 AM   Specimen: BLOOD  Result Value Ref Range Status   Specimen Description BLOOD BLOOD LEFT ARM  Final   Special Requests   Final    BOTTLES DRAWN AEROBIC AND ANAEROBIC Blood Culture adequate volume   Culture   Final    NO GROWTH 5 DAYS Performed at [facility], [address], [place], [place] [address]    Report Status 07/30/2023 FINAL  Final  Blood Culture (routine x 2)     Status:  None   Collection Time: 07/25/23 12:36 AM   Specimen: BLOOD  Result Value Ref Range Status   Specimen Description BLOOD BLOOD LEFT HAND  Final   Special Requests   Final    BOTTLES DRAWN AEROBIC AND ANAEROBIC Blood Culture adequate volume   Culture   Final    NO GROWTH 5 DAYS Performed at [facility], [address] [place], [place] [address]    Report Status 07/30/2023 FINAL  Final  Culture, body fluid w Gram Stain-bottle     Status: None (Preliminary result)   Collection Time: 07/27/23 12:12 PM   Specimen: Pleura  Result Value Ref Range Status   Specimen Description PLEURAL  Final   Special Requests NONE  Final   Culture   Final    NO GROWTH 4 DAYS Performed at [facility], [address] [place], [place] [address]    Report Status PENDING  Incomplete  Gram stain     Status: None   Collection Time: 07/27/23 12:12 PM   Specimen: Pleura  Result Value Ref Range Status   Specimen Description PLEURAL  Final   Special Requests NONE  Final   Gram Stain   Final    NO WBC SEEN NO ORGANISMS SEEN Performed at [facility], [address], [place], [place] [address]    Report Status 07/27/2023 FINAL  Final    RADIOLOGY STUDIES/RESULTS: ECHOCARDIOGRAM LIMITED Result Date: 07/30/2023    ECHOCARDIOGRAM LIMITED REPORT   Patient Name:   [name] [name] Date of Exam: 07/30/2023 Medical Rec #:  [id number]         Height:       62.0 in Accession #:    [id number]        Weight:       122.4 lb Date of Birth:  Sep 28, 1950         BSA:          1.551 m Patient Age:    72 years          BP:           96/47 mmHg Patient Gender: F                 HR:           70 bpm. Exam Location:  Inpatient Procedure: Limited Echo, Cardiac Doppler and Color Doppler Indications:    Endocarditis  History:        Patient has prior history of Echocardiogram examinations, most                  recent 07/12/2023. Risk Factors:Hypertension and Dyslipidemia.                 Aortic Valve: Edwards valve is present in the aortic position.  Sonographer:    Carmelita Hartshorn RDCS, FE, PE Referring Phys: [id number] TORIBIO JAYSON SMITH IMPRESSIONS  1. Left ventricular ejection fraction, by estimation, is 70 to 75%. The left ventricle has hyperdynamic function. There is the interventricular septum is flattened in systole and diastole, consistent with right ventricular pressure and volume overload.  2. Right ventricular systolic function is mildly reduced. The right ventricular size is mildly enlarged.  3. The mitral valve is degenerative. Mild to moderate mitral valve regurgitation. Severe mitral annular calcification.  4. Tricuspid valve regurgitation is moderate.  5. Bioprosthetic aortic valve. No doppler assessment performed. The aortic valve has been repaired/replaced. Aortic valve regurgitation is not visualized. There is a Theme park manager  present in the aortic position. Conclusion(s)/Recommendation(s): No evidence of valvular vegetations on this transthoracic echocardiogram. Consider a transesophageal echocardiogram to exclude infective endocarditis if clinically indicated. FINDINGS  Left Ventricle: Left ventricular ejection fraction, by estimation, is 70 to 75%. The left ventricle has hyperdynamic function. The interventricular septum is flattened in systole and diastole, consistent with right ventricular pressure and volume overload. Right Ventricle: The right ventricular size is mildly enlarged. No increase in right ventricular wall thickness. Right ventricular systolic function is mildly reduced. Pericardium: There is no evidence of pericardial effusion. Mitral Valve: The mitral valve is degenerative in appearance. Severe mitral annular calcification. Mild to moderate mitral valve regurgitation. MV peak gradient, 8.3 mmHg. Tricuspid Valve: The tricuspid valve is grossly normal. Tricuspid valve regurgitation  is moderate . No evidence of tricuspid stenosis. Aortic Valve: Bioprosthetic aortic valve. No doppler assessment performed. The aortic valve has been repaired/replaced. Aortic valve regurgitation is not visualized. There is a Edwards valve present in the aortic position. Pulmonic Valve: The pulmonic valve was grossly normal. Pulmonic valve regurgitation is mild. No evidence of pulmonic stenosis. Venous: The inferior vena cava was not well visualized. Additional Comments: A device lead is visualized in the right atrium and right ventricle.  LEFT VENTRICLE PLAX 2D LVIDd:         3.30 cm LVIDs:         1.90 cm LV PW:         1.00 cm LV IVS:        0.90 cm  MITRAL VALVE MV Peak grad: 8.3 mmHg MV Vmax:      1.44 m/s MV Vmean:     80.8 cm/s Darryle Decent MD Electronically signed by Darryle Decent MD Signature Date/Time: 07/30/2023/5:25:40 PM    Final    CT CHEST WO CONTRAST Result Date: 07/30/2023 CLINICAL DATA:  Pneumonia, complications suspected. EXAM: CT CHEST WITHOUT CONTRAST TECHNIQUE: Multidetector CT imaging of the chest was performed following the standard protocol without IV contrast. RADIATION DOSE REDUCTION: This exam was performed according to the departmental dose-optimization program which includes automated exposure control, adjustment of the mA and/or kV according to patient size and/or use of iterative reconstruction technique. COMPARISON:  Chest radiograph dated 07/29/2023 and CT dated 08/10/2022. FINDINGS: Evaluation of this exam is limited in the absence of intravenous contrast. Cardiovascular: There is no cardiomegaly. No significant pericardial effusion. Left pectoral pacing device. There is advanced 3 vessel coronary vascular calcification and calcification of the mitral annulus. There is postsurgical changes of CABG. Advanced atherosclerotic calcification of the thoracic aorta. No aneurysmal dilatation. The central pulmonary arteries are grossly unremarkable on this noncontrast CT.  Mediastinum/Nodes: No obvious hilar or mediastinal adenopathy. Evaluation however is limited in the absence of intravenous contrast and due to consolidative changes of the right lung. The esophagus is grossly unremarkable. No mediastinal fluid collection. Right IJ catheter with tip in the right atrium. Lungs/Pleura: There is a somewhat loculated appearing moderate size right pleural effusion tracking into the fissures. There is compressive atelectasis of portions of the right upper, right lower and right middle lobes. Areas of airspace density at the right lung base and in the right upper lobe as well as several scattered clusters of ground-glass nodularity in the left lung concerning for pneumonia. There is slight thickening of the right lung pleural surface. And ill-defined area of increased density in the right lower lobe measures approximately 2.3 x 2.1 cm (95/3) and may represent pneumonia or proteinaceous content within the pleural effusion. However, underlying mass  is not excluded. Additionally there is a 1.7 x 0.9 cm nodular density in the right apex which may represent scarring or a pulmonary nodule. There is no pneumothorax. The central airways are patent. Upper Abdomen: No acute abnormality. Musculoskeletal: Median sternotomy wires. No acute osseous pathology. IMPRESSION: 1. Moderate size loculated appearing right pleural effusion with partial compressive atelectasis of the right lung. 2. Findings likely represent multilobar pneumonia. Clinical correlation and follow-up to resolution recommended. 3. Ill-defined density at the right lung base may represent proteinaceous content or infiltrate. A mass/neoplasm is not excluded. Thoracentesis may provide additional diagnostic information if clinically indicated. 4. Focal area of scarring versus nodule in the right apex. Attention on follow-up imaging recommended. 5.  Aortic Atherosclerosis (ICD10-I70.0). Electronically Signed   By: Vanetta Chou M.D.   On:  07/30/2023 09:31   DG CHEST PORT 1 VIEW Result Date: 07/29/2023 CLINICAL DATA:  Follow-up pleural effusion EXAM: PORTABLE CHEST 1 VIEW COMPARISON:  07/27/2023 FINDINGS: Cardiac shadow is stable. Postsurgical changes are again seen. Pacing device and dialysis catheter are again noted. Right-sided pleural effusion is again seen slightly less than that noted on the prior exam. This may be related to the upright positioning. Left lung is clear. IMPRESSION: Right-sided pleural effusion slightly less than that seen on the prior exam although this may be positionally related. Electronically Signed   By: Oneil Devonshire M.D.   On: 07/29/2023 18:06     LOS: 6 days   Donalda Applebaum, MD  [facility]    To contact the attending provider between 7A-7P or the covering provider during after hours 7P-7A, please log into the web site [email or web address] and access using universal [place] password for that web site. If you do not have the password, please call the hospital operator.  07/31/2023, 12:02 PM

## 2023-07-31 NOTE — Progress Notes (Signed)
 [facility] Progress Note   Subjective:   Patient seen in HD - comfortably resting in bed. UFG 2L today. Pt reports continued improvement in coughing and breathing. Pt continues to feel weak and fatigued. Denies CP/dyspnea.   Objective Vitals:   07/31/23 0930 07/31/23 1000 07/31/23 1030 07/31/23 1100  BP: 120/77 107/63 108/70 (!) 82/57  Pulse: 97 78 (!) 117 91  Resp: 17 16 17 17   Temp:      TempSrc:      SpO2: 100% 100% 100% 100%  Weight:      Height:       Physical Exam General: Frail woman in NAD. Sleepy but alert and answering questions. 1 L nasal O2 in place.  Heart:RRR, 4/6 murmur. Lungs: CTA anteriorly, no rales/wheezing Abdomen: non distended, non tender Extremities: no LE edema Dialysis Access: right internal jugular TDC + maturing RUE AVF  Additional Objective Labs: Basic Metabolic Panel: Recent Labs  Lab 07/29/23 0444 07/30/23 0433 07/31/23 0454  NA 128* 130* 126*  K 4.0 3.7 3.8  CL 90* 92* 91*  CO2 24 23 22   GLUCOSE 172* 169* 174*  BUN 68* 28* 52*  CREATININE 3.73* 2.11* 3.52*  CALCIUM  9.3 9.5 9.2  PHOS 4.1 2.9 4.0   Liver Function Tests: Recent Labs  Lab 07/27/23 0518 07/28/23 0548 07/29/23 0444 07/30/23 0433 07/31/23 0454  AST 30  --   --   --   --   ALT 12  --   --   --   --   ALKPHOS 180*  --   --   --   --   BILITOT 1.1  --   --   --   --   PROT 6.9  --   --   --   --   ALBUMIN  3.4*   < > 3.2* 3.4* 3.0*   < > = values in this interval not displayed.   CBC: Recent Labs  Lab 07/28/23 0548 07/29/23 0444 07/30/23 0433 07/30/23 1530 07/31/23 0454  WBC 5.6 5.9 10.3 7.8 10.0  NEUTROABS 3.9 4.1 7.9*  --  7.1  HGB 11.6* 11.3* 12.4 11.4* 11.3*  HCT 34.1* 33.3* 36.5 33.7* 33.2*  MCV 92.7 91.7 91.3 91.6 91.2  PLT 94* 99* 130* 120* 128*   Blood Culture    Component Value Date/Time   SDES PLEURAL 07/27/2023 1212   SDES PLEURAL 07/27/2023 1212   SPECREQUEST NONE 07/27/2023 1212   SPECREQUEST NONE 07/27/2023 1212   CULT   07/27/2023 1212    NO GROWTH 4 DAYS Performed at [facility], [address], [place], [place] [address]    REPTSTATUS PENDING 07/27/2023 1212   REPTSTATUS 07/27/2023 FINAL 07/27/2023 1212    CBG: Recent Labs  Lab 07/30/23 0817 07/30/23 1140 07/30/23 1621 07/30/23 2016 07/31/23 0734  GLUCAP 136* 130* 146* 212* 197*   Studies/Results: ECHOCARDIOGRAM LIMITED Result Date: 07/30/2023    ECHOCARDIOGRAM LIMITED REPORT   Patient Name:   KAYTLYNN KOCHAN Date of Exam: 07/30/2023 Medical Rec #:  [id number]         Height:       62.0 in Accession #:    [id number]        Weight:       122.4 lb Date of Birth:  Nov 29, 1950         BSA:          1.551 m Patient Age:    73 years  BP:           96/47 mmHg Patient Gender: F                 HR:           70 bpm. Exam Location:  Inpatient Procedure: Limited Echo, Cardiac Doppler and Color Doppler Indications:    Endocarditis  History:        Patient has prior history of Echocardiogram examinations, most                 recent 07/12/2023. Risk Factors:Hypertension and Dyslipidemia.                 Aortic Valve: Edwards valve is present in the aortic position.  Sonographer:    Carmelita Hartshorn RDCS, FE, PE Referring Phys: [id number] TORIBIO BROCKS SMITH IMPRESSIONS  1. Left ventricular ejection fraction, by estimation, is 70 to 75%. The left ventricle has hyperdynamic function. There is the interventricular septum is flattened in systole and diastole, consistent with right ventricular pressure and volume overload.  2. Right ventricular systolic function is mildly reduced. The right ventricular size is mildly enlarged.  3. The mitral valve is degenerative. Mild to moderate mitral valve regurgitation. Severe mitral annular calcification.  4. Tricuspid valve regurgitation is moderate.  5. Bioprosthetic aortic valve. No doppler assessment performed. The aortic valve has been repaired/replaced. Aortic valve regurgitation is not visualized. There is a Edwards valve present  in the aortic position. Conclusion(s)/Recommendation(s): No evidence of valvular vegetations on this transthoracic echocardiogram. Consider a transesophageal echocardiogram to exclude infective endocarditis if clinically indicated. FINDINGS  Left Ventricle: Left ventricular ejection fraction, by estimation, is 70 to 75%. The left ventricle has hyperdynamic function. The interventricular septum is flattened in systole and diastole, consistent with right ventricular pressure and volume overload. Right Ventricle: The right ventricular size is mildly enlarged. No increase in right ventricular wall thickness. Right ventricular systolic function is mildly reduced. Pericardium: There is no evidence of pericardial effusion. Mitral Valve: The mitral valve is degenerative in appearance. Severe mitral annular calcification. Mild to moderate mitral valve regurgitation. MV peak gradient, 8.3 mmHg. Tricuspid Valve: The tricuspid valve is grossly normal. Tricuspid valve regurgitation is moderate . No evidence of tricuspid stenosis. Aortic Valve: Bioprosthetic aortic valve. No doppler assessment performed. The aortic valve has been repaired/replaced. Aortic valve regurgitation is not visualized. There is a Edwards valve present in the aortic position. Pulmonic Valve: The pulmonic valve was grossly normal. Pulmonic valve regurgitation is mild. No evidence of pulmonic stenosis. Venous: The inferior vena cava was not well visualized. Additional Comments: A device lead is visualized in the right atrium and right ventricle.  LEFT VENTRICLE PLAX 2D LVIDd:         3.30 cm LVIDs:         1.90 cm LV PW:         1.00 cm LV IVS:        0.90 cm  MITRAL VALVE MV Peak grad: 8.3 mmHg MV Vmax:      1.44 m/s MV Vmean:     80.8 cm/s Darryle Decent MD Electronically signed by Darryle Decent MD Signature Date/Time: 07/30/2023/5:25:40 PM    Final    CT CHEST WO CONTRAST Result Date: 07/30/2023 CLINICAL DATA:  Pneumonia, complications suspected. EXAM: CT  CHEST WITHOUT CONTRAST TECHNIQUE: Multidetector CT imaging of the chest was performed following the standard protocol without IV contrast. RADIATION DOSE REDUCTION: This exam was performed according to the departmental dose-optimization program  which includes automated exposure control, adjustment of the mA and/or kV according to patient size and/or use of iterative reconstruction technique. COMPARISON:  Chest radiograph dated 07/29/2023 and CT dated 08/10/2022. FINDINGS: Evaluation of this exam is limited in the absence of intravenous contrast. Cardiovascular: There is no cardiomegaly. No significant pericardial effusion. Left pectoral pacing device. There is advanced 3 vessel coronary vascular calcification and calcification of the mitral annulus. There is postsurgical changes of CABG. Advanced atherosclerotic calcification of the thoracic aorta. No aneurysmal dilatation. The central pulmonary arteries are grossly unremarkable on this noncontrast CT. Mediastinum/Nodes: No obvious hilar or mediastinal adenopathy. Evaluation however is limited in the absence of intravenous contrast and due to consolidative changes of the right lung. The esophagus is grossly unremarkable. No mediastinal fluid collection. Right IJ catheter with tip in the right atrium. Lungs/Pleura: There is a somewhat loculated appearing moderate size right pleural effusion tracking into the fissures. There is compressive atelectasis of portions of the right upper, right lower and right middle lobes. Areas of airspace density at the right lung base and in the right upper lobe as well as several scattered clusters of ground-glass nodularity in the left lung concerning for pneumonia. There is slight thickening of the right lung pleural surface. And ill-defined area of increased density in the right lower lobe measures approximately 2.3 x 2.1 cm (95/3) and may represent pneumonia or proteinaceous content within the pleural effusion. However, underlying  mass is not excluded. Additionally there is a 1.7 x 0.9 cm nodular density in the right apex which may represent scarring or a pulmonary nodule. There is no pneumothorax. The central airways are patent. Upper Abdomen: No acute abnormality. Musculoskeletal: Median sternotomy wires. No acute osseous pathology. IMPRESSION: 1. Moderate size loculated appearing right pleural effusion with partial compressive atelectasis of the right lung. 2. Findings likely represent multilobar pneumonia. Clinical correlation and follow-up to resolution recommended. 3. Ill-defined density at the right lung base may represent proteinaceous content or infiltrate. A mass/neoplasm is not excluded. Thoracentesis may provide additional diagnostic information if clinically indicated. 4. Focal area of scarring versus nodule in the right apex. Attention on follow-up imaging recommended. 5.  Aortic Atherosclerosis (ICD10-I70.0). Electronically Signed   By: Vanetta Chou M.D.   On: 07/30/2023 09:31   DG CHEST PORT 1 VIEW Result Date: 07/29/2023 CLINICAL DATA:  Follow-up pleural effusion EXAM: PORTABLE CHEST 1 VIEW COMPARISON:  07/27/2023 FINDINGS: Cardiac shadow is stable. Postsurgical changes are again seen. Pacing device and dialysis catheter are again noted. Right-sided pleural effusion is again seen slightly less than that noted on the prior exam. This may be related to the upright positioning. Left lung is clear. IMPRESSION: Right-sided pleural effusion slightly less than that seen on the prior exam although this may be positionally related. Electronically Signed   By: Oneil Devonshire M.D.   On: 07/29/2023 18:06   Medications:  anticoagulant sodium citrate       (feeding supplement) PROSource Plus  30 mL Oral BID BM   benzonatate   200 mg Oral TID   Chlorhexidine  Gluconate Cloth  6 each Topical Q0600   dextromethorphan   30 mg Oral QHS   escitalopram   5 mg Oral QHS   famotidine   10 mg Oral QHS   fluticasone   2 spray Each Nare Daily    gabapentin   100 mg Oral Daily   heparin  sodium (porcine)  3,800 Units Intracatheter Once   insulin  glargine-yfgn  6 Units Subcutaneous Daily   insulin  glulisine  0-6 Units Subcutaneous TID  with meals   lactulose   20 g Oral BID   metoprolol  succinate  12.5 mg Oral Daily   midodrine   5 mg Oral Once per day on Sunday Tuesday Thursday Saturday   pantoprazole  (PROTONIX ) IV  40 mg Intravenous Q12H   sodium chloride  flush  3 mL Intravenous Q12H    Dialysis Orders Home HD (in training) MTTF, EDW 52kg, 3hrs, 2k, currently on IV iron     Assessment/Plan: Influenza A + PNA: S/p course of Levaquin . Symptoms improving. Cough and dyspnea continue to improve. Weakness/malaise continues. PT evaluated and will follow.  AHRF: Due to #1 and loculated pleural effusion, s/p thora 1/4 - Cx negative.  AMS/delirium: Improving, alert and oriented today. ESRD: Recently started home HD training, follow MWF schedule here - HD today. HTN/volume: On midodrine  pre HD. Mild hypoNa - UFG 2L as tolerated today (BP low so unclear if can reach goal). 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 Not on binders.   Nutrition: Alb low, continue supps. CAD/Hx CABG/HFrEF T1DM  Signe Sick, PA-S Izetta Boehringer, PA-C 07/31/2023, 11:06 AM  [employer]

## 2023-07-31 NOTE — Progress Notes (Signed)
 07/31/2023 Recommend 8 week f/u CT scan; will arrange f/u in clinic.  Myrla Halsted MD PCCM

## 2023-07-31 NOTE — Consult Note (Addendum)
 Consultation  Referring Provider:  [facility]  Primary Care Physician:  Rolinda Millman, MD Primary Gastroenterologist:  [facility]       Reason for Consultation:     Rectal bleeding  LOS: 6 days          HPI:   [name] [name] is a 73 y.o. female with past medical history significant for ESRD on HD M/T/TH/F, DM-1 on insulin  pump, CAD s/p CABG x 2, s/p bioprosthetic AVR, A-fib on Eliquis , s/p PPM in place, RCC-s/p right nephrectomy, remote history of lymphoma, breast cancer 2016, cirrhosis secondary to NASH presents for evaluation of rectal bleeding.  History of respiratory infection in November treated with levofloxacin  by PCP.  Presented to [facility] on 1/1 with weakness/cough-found to have encephalopathy in the setting of influenza infection.  Patient found to have pleural effusion on chest x-ray and underwent thoracentesis with IR 1/4 with 400 cc of amber fluid removed.  She is currently evaluating followed by PCCM.  Patient follows with [facility]ology and hepatology.  Patient has remote history of duodenal ulcer (H. pylori positive).  Appears she has had multiple positive fecal occult's with GI endoscopies failing to reveal a bleeding source and this has been chronic. An EGD and capsule endoscopy were normal in Sept 2014 and colonoscopy was normal in Dec 2013.  Patient was last seen by them February 2024 and at that time she was getting regular blood transfusion/IV iron  for chronic slow GI bleeding she is on chronic aspirin  therapy and clopidogrel .  She was having regular intermittent blood in her stool.  She was also having dysphagia and underwent EGD January 2024 showed no evidence of etiology for her dysphagia.  She was worked for to esophageal specialist but patient declined.  GI was consulted because yesterday patient had 2 episodes of rectal bleeding.  Primary team would like to resume patient's blood thinner but wanted to be sure no endoscopic procedures were to be pursued before resuming  blood thinner.  Hgb is baseline (11.3).  Hemodynamically stable.  Patient states she is not interested in endoscopic procedures at this time and would actively like to avoid if possible.   PREVIOUS GI WORKUP   EGD 07/26/2022 revealed a small hiatal hernia and fundic gland polyps. There were no esophageal varices. There was no determined etiology for her dysphagia. She states the dysphagia continues intermittently with crackers if not enough fluid, or with large pills.  Was referred to specialist but patient declined   An EGD and capsule endoscopy were normal in Sept 2014 and colonoscopy was normal in Dec 2013  Liver biopsy 1999 showed steatohepatitis.  FibroScan November 2014 showed liver stiffness with 12.9 kPa  Past Medical History:  Diagnosis Date  . Anxiety   . Aortic stenosis    a. bicuspid aortic valve; mean gradient of 20 mmHg in 2/10; b. s/p bioprosth AVR (19-mm Edwards pericardial valve) with a redo coronary artery bypass graft procedure in 07/2009; postoperative Dressler's syndrome    . Arthritis   . Breast cancer (HCC)   . Carcinoma, renal cell (HCC) 05/2007   Laparoscopic right nephrectomy  . Chronic diastolic CHF (congestive heart failure) (HCC)    a. 9.2014 EchoP EF 60-65%, no rwma, bioprosth AVR, mean gradient of , mild to mod MR, PASP .  . CKD (chronic kidney disease), stage IV (HCC)   . Colitis, ischemic (HCC) 2008  . Complication of anesthesia   . Coronary artery disease    a. initial CABG  with SVG-RCA in 12/05. b. redo SVG-RCA and bioprosthetic AVR in 1/11. d. 9/14 - PCI with DES to distal LM/proximal LAD was done rather than bypass as she would have been a poor candidate for redo sternotomy. d. S/p DES to prox LM 05/2014.  . Diabetes mellitus 03/2010    TYPE II: Hemoglobin A1c of 7.4 in  03/2010; 8.4 in 06/2010; treated with insulin  pump  . Duodenal ulcer    Remote; H. pylori positive  . Dyspnea   . Gastroparesis due to DM (HCC) 05/01/2012  . Glaucoma  1998  . Hodgkin's disease (HCC) 1991   Mantle radiation therapy  . Hyperlipidemia   . Hyperplastic gastric polyp 06/23/2012  . Hypertension   . Iron  deficiency anemia    a. 03/2013 EGD: essentially normal. Possible slow GIB.  . Migraines   . NASH (nonalcoholic steatohepatitis) 1999   -biopsy in 1999  . OSA treated with BiPAP   . Osteoporosis   . Pacemaker    six sinus syndrome  . PAF (paroxysmal atrial fibrillation) (HCC)    a.  h/o brief episodes noted on ppm interrogation. b. given GI bleeding and need to be on both ASA 81 and Brilinta , risks likely outweigh benefits of anticoagulation.   SABRA PONV (postoperative nausea and vomiting)   . Small bowel obstruction (HCC)    Recurrent; resolved after resection of a lipoma  . Syncope    a. H/o recurrent syncope with pauses on loop recorder s/p Medtronic pacemaker 07/2012.    Surgical History:  She  has a past surgical history that includes Total abdominal hysterectomy w/ bilateral salpingoophorectomy (2000); Breast excisional biopsy; Nephrectomy (2008); Coronary artery bypass graft (2005, 2011); Tumor excision (1981); Exploratory laparotomy w/ bowel resection; Aortic valve replacement (2011); Colonoscopy; Cervical biopsy; Liver biopsy; Bone marrow biopsy; Esophagogastroduodenoscopy; Esophagogastroduodenoscopy (06/23/2012); Colonoscopy (06/23/2012); maloney dilation (06/23/2012); Pacemaker insertion (08/21/2012); Esophagogastroduodenoscopy (N/A, 04/06/2013); Givens capsule study (N/A, 04/30/2013); loop recorder implant (N/A, 12/10/2011); permanent pacemaker insertion (N/A, 08/21/2012); left and right heart catheterization with coronary angiogram (N/A, 04/07/2013); percutaneous coronary stent intervention (pci-s) (N/A, 04/09/2013); left heart catheterization with coronary/graft angiogram (N/A, 06/07/2014); Mastectomy (Left, 05/17/2015); Total mastectomy (Left, 05/17/2015); TEE without cardioversion (N/A, 12/07/2015); Left Atrial Appendage Occlusion  (N/A, 02/16/2016); RIGHT HEART CATH AND CORONARY/GRAFT ANGIOGRAPHY (N/A, 04/19/2017); Cardiac catheterization; Abdominal hysterectomy (2000); RIGHT HEART CATH (N/A, 03/18/2019); RIGHT HEART CATH (N/A, 08/21/2022); AV fistula placement (Right, 05/01/2023); and Insertion of dialysis catheter (Right, 05/01/2023). Family History:  Her family history includes Anal fissures in her mother; Breast cancer (age of onset: 63) in her sister; Cancer (age of onset: 43) in her sister; Cancer (age of onset: 40) in her maternal grandfather; Diabetes in her father; Glaucoma in her brother; Heart attack in her father, maternal grandmother, and mother; Heart disease in her father and mother; Hypertension in her brother; Lung cancer in her sister; Parkinson's disease in her paternal aunt. Social History:   reports that she has never smoked. She has never used smokeless tobacco. She reports that she does not drink alcohol and does not use drugs.  Prior to Admission medications   Medication Sig Start Date End Date Taking? Authorizing Provider  ALPRAZolam  (XANAX ) 0.5 MG tablet Take 1 tablet (0.5 mg total) by mouth 2 (two) times daily as needed for anxiety. 01/15/18  Yes Hagler, Vernell, MD  APIDRA  100 UNIT/ML injection Inject 40 Units into the skin as directed. INFUSE THROUGH INSULIN  PUMP UTD, Bolus depends on carb intake 02/04/15  Yes [provider]  apixaban  (ELIQUIS ) 5  MG TABS tablet Take 1 tablet (5 mg total) by mouth 2 (two) times daily. 07/12/23  Yes Rolan Ezra RAMAN, MD  aspirin  EC 81 MG tablet Take 81 mg by mouth at bedtime.   Yes [provider]  Coenzyme Q10 (CO Q 10) 100 MG CAPS Take 100 mg by mouth at bedtime.    Yes [provider]  colchicine  0.6 MG tablet Take 0.6 mg by mouth daily as needed (gout flare).   Yes [provider]  denosumab  (PROLIA ) 60 MG/ML SOSY injection Inject 60 mg into the skin every 6 (six) months.   Yes [provider]  escitalopram  (LEXAPRO ) 5 MG  tablet Take 5 mg by mouth at bedtime.   Yes [provider]  famotidine  (PEPCID ) 40 MG tablet Take 40 mg by mouth at bedtime.   Yes [provider]  gabapentin  (NEURONTIN ) 100 MG capsule Take 100 mg by mouth daily.   Yes [provider]  glucagon  (GLUCAGON  EMERGENCY) 1 MG injection Inject 1 mg into the vein once as needed (low blood sugar).   Yes [provider]  isosorbide  mononitrate (IMDUR ) 120 MG 24 hr tablet Take 1 tablet (120 mg total) by mouth every morning. 05/08/23  Yes Rolan Ezra RAMAN, MD  latanoprost  (XALATAN ) 0.005 % ophthalmic solution Place 1 drop into both eyes at bedtime.   Yes [provider]  metoprolol  succinate (TOPROL  XL) 25 MG 24 hr tablet Take 0.5 tablets (12.5 mg total) by mouth daily. 05/22/23  Yes Rolan Ezra RAMAN, MD  midodrine  (PROAMATINE ) 5 MG tablet Take 1 tablet (5 mg total) by mouth daily as needed (Prior to HD). 05/08/23  Yes Rolan Ezra RAMAN, MD  mometasone  (NASONEX ) 50 MCG/ACT nasal spray Place 1 spray into the nose daily as needed (allergies). 03/10/20  Yes [provider]  nitroGLYCERIN  (NITROLINGUAL ) 0.4 MG/SPRAY spray PLACE 1 SPRAY UNDER TH TONGUE EVERY 5 MINUTES FOR 3 DOSES AS NEEDED FOR CHEST PAIN 12/25/22  Yes Rolan Ezra RAMAN, MD  polyethylene glycol (MIRALAX  / GLYCOLAX ) 17 g packet Take 17 g by mouth in the morning. Hold for loose stools   Yes [provider]  potassium chloride  SA (KLOR-CON  M) 20 MEQ tablet Take 1 tablet (20 mEq total) by mouth 2 (two) times daily. 07/18/23  Yes Raford Lenis, MD  REPATHA  SURECLICK 140 MG/ML SOAJ INJECT 140MG  INTO THE SKIN EVERY 14 DAYS 03/29/23  Yes Rolan Ezra RAMAN, MD  ULORIC  40 MG tablet Take 1 tablet (40 mg total) by mouth at bedtime. 11/29/17  Yes Hagler, Vernell, MD  VASCEPA  1 g capsule TAKE TWO CAPSULES (2 GRAMS TOTAL) BY MOUTH TWO TIMES DAILY 02/26/23  Yes Rolan Ezra RAMAN, MD    Current Facility-Administered Medications  Medication Dose Route Frequency  Provider Last Rate Last Admin  . (feeding supplement) PROSource Plus liquid 30 mL  30 mL Oral BID BM Stovall, Kathryn R, PA-C   30 mL at 07/30/23 0948  . acetaminophen  (TYLENOL ) tablet 500 mg  500 mg Oral Q6H PRN Keturah Carrier, MD   500 mg at 07/26/23 1506  . ALPRAZolam  (XANAX ) tablet 0.5 mg  0.5 mg Oral QHS PRN Segars, Jonathan, MD   0.5 mg at 07/30/23 0237  . alteplase  (CATHFLO ACTIVASE ) injection 2 mg  2 mg Intracatheter Once PRN Stovall, Kathryn R, PA-C      . anticoagulant sodium citrate  solution 5 mL  5 mL Intracatheter PRN Stovall, Kathryn R, PA-C      . benzonatate  (TESSALON ) capsule 200  mg  200 mg Oral TID Ghimire, Shanker M, MD   200 mg at 07/30/23 2212  . Chlorhexidine  Gluconate Cloth 2 % PADS 6 each  6 each Topical Q0600 Macel Jayson PARAS, MD   6 each at 07/31/23 [phone]  . dextromethorphan  (DELSYM ) 30 MG/5ML liquid 30 mg  30 mg Oral QHS Ghimire, Shanker M, MD   30 mg at 07/30/23 2213  . escitalopram  (LEXAPRO ) tablet 5 mg  5 mg Oral QHS Segars, Jonathan, MD   5 mg at 07/30/23 2213  . famotidine  (PEPCID ) tablet 10 mg  10 mg Oral QHS Utomwen, Adesuwa, RPH   10 mg at 07/30/23 2213  . fluticasone  (FLONASE ) 50 MCG/ACT nasal spray 2 spray  2 spray Each Nare Daily Reome, Earle J, RPH   2 spray at 07/30/23 0948  . gabapentin  (NEURONTIN ) capsule 100 mg  100 mg Oral Daily Raenelle Donalda HERO, MD   100 mg at 07/30/23 2212  . heparin  injection 1,000 Units  1,000 Units Intracatheter PRN Stovall, Kathryn R, PA-C      . heparin  sodium (porcine) injection 3,800 Units  3,800 Units Intracatheter Once Stovall, Kathryn R, PA-C      . insulin  glargine-yfgn (SEMGLEE ) injection 6 Units  6 Units Subcutaneous Daily Ghimire, Shanker M, MD   6 Units at 07/30/23 [phone]  . insulin  glulisine (APIDRA ) injection 0-6 Units  0-6 Units Subcutaneous TID with meals Raenelle Donalda HERO, MD   1 Units at 07/31/23 [phone]  . lactulose  (CHRONULAC ) 10 GM/15ML solution 20 g  20 g Oral BID Reome, Earle J, RPH   20 g at 07/30/23 2212  .  levalbuterol  (XOPENEX  HFA) inhaler 2 puff  2 puff Inhalation Q6H PRN Keturah Carrier, MD      . lidocaine  (PF) (XYLOCAINE ) 1 % injection 5 mL  5 mL Intradermal PRN Stovall, Kathryn R, PA-C      . lidocaine -prilocaine  (EMLA ) cream 1 Application  1 Application Topical PRN Stovall, Kathryn R, PA-C      . metoprolol  succinate (TOPROL -XL) 24 hr tablet 12.5 mg  12.5 mg Oral Daily Reome, Earle J, RPH   12.5 mg at 07/30/23 0946  . midodrine  (PROAMATINE ) tablet 5 mg  5 mg Oral Once per day on Sunday Tuesday Thursday Saturday Vicci Pen L, MD   5 mg at 07/31/23 0827  . ondansetron  (ZOFRAN ) injection 4 mg  4 mg Intravenous Q6H PRN Keturah Carrier, MD   4 mg at 07/30/23 0243  . Oral care mouth rinse  15 mL Mouth Rinse PRN Briana Elgin LABOR, MD      . pantoprazole  (PROTONIX ) injection 40 mg  40 mg Intravenous Q12H Ghimire, Donalda HERO, MD      . pentafluoroprop-tetrafluoroeth (GEBAUERS) aerosol 1 Application  1 Application Topical PRN Stovall, Kathryn R, PA-C      . sodium chloride  (OCEAN) 0.65 % nasal spray 1 spray  1 spray Each Nare PRN Briana Elgin LABOR, MD      . sodium chloride  flush (NS) 0.9 % injection 3 mL  3 mL Intravenous Q12H Segars, Jonathan, MD   3 mL at 07/30/23 2218    Allergies as of 07/24/2023 - Review Complete 07/24/2023  Allergen Reaction Noted  . Avandia [rosiglitazone maleate] Other (See Comments) 05/20/2008  . Cephalexin Anaphylaxis, Swelling, and Rash 05/20/2008  . Clindamycin Anaphylaxis and Shortness Of Breath 11/25/2013  . Humalog [insulin  lispro] Itching 06/06/2014  . Lincomycin Anaphylaxis and Shortness Of Breath 11/25/2013  . Metformin Other (See Comments) 05/20/2008  . Novolog  [insulin   aspart (human analog)] Hives and Itching 04/04/2013  . Penicillins Anaphylaxis, Hives, Swelling, and Rash 05/20/2008  . Tizanidine Other (See Comments) 12/02/2012  . Adhesive [tape] Other (See Comments) 02/13/2016  . Atorvastatin Other (See Comments) 05/20/2008  . Cholestyramine Other (See  Comments) 03/16/2014  . Gentamycin [gentamicin ] Hives, Itching, and Rash 12/21/2011  . Iodinated contrast media Other (See Comments) 07/20/2011  . Limonene Hives, Itching, Rash, and Other (See Comments) 10/07/2014  . Prednisone  Itching and Other (See Comments) 02/08/2016  . Ranexa  [ranolazine ] Other (See Comments) 10/14/2017  . Rosuvastatin Nausea And Vomiting, Nausea Only, and Other (See Comments) 04/03/2013  . Simvastatin  Other (See Comments) 05/20/2008  . Zinc   06/04/2023  . Acetaminophen   05/01/2023  . Azelastine  Other (See Comments) 10/10/2020  . Clopidogrel  Other (See Comments) 04/29/2013  . Kerendia  [finerenone ] Hives and Nausea Only 06/13/2022  . Lisinopril  Diarrhea, Nausea Only, and Other (See Comments) 10/10/2020  . Neosporin [bacitracin -polymyxin b ] Dermatitis 04/19/2023  . Codeine Nausea And Vomiting 05/20/2008  . Erythromycin  Nausea And Vomiting 05/20/2008  . Hydrocodone -acetaminophen  Itching, Rash, and Other (See Comments) 06/28/2015  . Latex Itching and Other (See Comments) 05/10/2015  . Neomycin Rash and Other (See Comments) 02/13/2016  . Nickel Itching 04/21/2015  . Ranitidine Nausea Only   . Tamiflu [oseltamivir] Nausea And Vomiting 08/26/2013  . Tramadol Nausea Only     Review of Systems  Constitutional:  Positive for chills, fever and malaise/fatigue. Negative for weight loss.  HENT:  Negative for hearing loss and tinnitus.   Eyes:  Negative for blurred vision and double vision.  Respiratory:  Positive for cough and shortness of breath.   Cardiovascular:  Negative for chest pain and palpitations.  Gastrointestinal:  Positive for blood in stool. Negative for abdominal pain, constipation, diarrhea, heartburn, melena, nausea and vomiting.  Genitourinary:  Negative for dysuria and urgency.  Musculoskeletal:  Negative for myalgias and neck pain.  Skin:  Negative for itching and rash.  Neurological:  Negative for seizures and loss of consciousness.   Psychiatric/Behavioral:  Negative for depression and suicidal ideas.        Physical Exam:  Vital signs in last 24 hours: Temp:  [97.6 F (36.4 C)-98.4 F (36.9 C)] 98.2 F (36.8 C) (01/08 0834) Pulse Rate:  [76-97] 78 (01/08 1000) Resp:  [16-22] 16 (01/08 1000) BP: (88-120)/(37-77) 107/63 (01/08 1000) SpO2:  [90 %-100 %] 100 % (01/08 1000) Weight:  [47.7 kg] 47.7 kg (01/08 0828) Last BM Date : 07/27/23 Last BM recorded by nurses in past 5 days Stool Type: Type 6 (Mushy consistency with ragged edges) (07/30/2023 12:00 PM)  Physical Exam Constitutional:      Comments: Very ill-appearing.  Very lethargic and often did not open eyes during conversation.  Oriented to person time and place.  HENT:     Head: Normocephalic and atraumatic.     Nose: Nose normal. No congestion.     Mouth/Throat:     Mouth: Mucous membranes are dry.  Eyes:     General: No scleral icterus.    Extraocular Movements: Extraocular movements intact.  Cardiovascular:     Rate and Rhythm: Normal rate and regular rhythm.  Pulmonary:     Effort: Pulmonary effort is normal. No respiratory distress.  Abdominal:     General: Bowel sounds are normal. There is no distension.     Palpations: Abdomen is soft. There is no mass.     Tenderness: There is no abdominal tenderness. There is no guarding or rebound.  Hernia: No hernia is present.  Musculoskeletal:        General: No swelling. Normal range of motion.  Skin:    General: Skin is warm and dry.  Neurological:     General: No focal deficit present.     Mental Status: She is oriented to person, place, and time.  Psychiatric:        Mood and Affect: Mood normal.        Behavior: Behavior normal.        Thought Content: Thought content normal.        Judgment: Judgment normal.      LAB RESULTS: Recent Labs    07/30/23 0433 07/30/23 1530 07/31/23 0454  WBC 10.3 7.8 10.0  HGB 12.4 11.4* 11.3*  HCT 36.5 33.7* 33.2*  PLT 130* 120* 128*    BMET Recent Labs    07/29/23 0444 07/30/23 0433 07/31/23 0454  NA 128* 130* 126*  K 4.0 3.7 3.8  CL 90* 92* 91*  CO2 24 23 22   GLUCOSE 172* 169* 174*  BUN 68* 28* 52*  CREATININE 3.73* 2.11* 3.52*  CALCIUM  9.3 9.5 9.2   LFT Recent Labs    07/31/23 0454  ALBUMIN  3.0*   PT/INR No results for input(s): LABPROT, INR in the last 72 hours.  STUDIES: ECHOCARDIOGRAM LIMITED Result Date: 07/30/2023    ECHOCARDIOGRAM LIMITED REPORT   Patient Name:   BITANIA SHANKLAND Date of Exam: 07/30/2023 Medical Rec #:  [id number]         Height:       62.0 in Accession #:    [id number]        Weight:       122.4 lb Date of Birth:  1950/12/26         BSA:          1.551 m Patient Age:    72 years          BP:           96/47 mmHg Patient Gender: F                 HR:           70 bpm. Exam Location:  Inpatient Procedure: Limited Echo, Cardiac Doppler and Color Doppler Indications:    Endocarditis  History:        Patient has prior history of Echocardiogram examinations, most                 recent 07/12/2023. Risk Factors:Hypertension and Dyslipidemia.                 Aortic Valve: Edwards valve is present in the aortic position.  Sonographer:    Carmelita Hartshorn RDCS, FE, PE Referring Phys: [id number] TORIBIO JAYSON SMITH IMPRESSIONS  1. Left ventricular ejection fraction, by estimation, is 70 to 75%. The left ventricle has hyperdynamic function. There is the interventricular septum is flattened in systole and diastole, consistent with right ventricular pressure and volume overload.  2. Right ventricular systolic function is mildly reduced. The right ventricular size is mildly enlarged.  3. The mitral valve is degenerative. Mild to moderate mitral valve regurgitation. Severe mitral annular calcification.  4. Tricuspid valve regurgitation is moderate.  5. Bioprosthetic aortic valve. No doppler assessment performed. The aortic valve has been repaired/replaced. Aortic valve regurgitation is not visualized. There is a  Edwards valve present in the aortic position. Conclusion(s)/Recommendation(s): No evidence of valvular vegetations on this transthoracic echocardiogram. Consider a transesophageal  echocardiogram to exclude infective endocarditis if clinically indicated. FINDINGS  Left Ventricle: Left ventricular ejection fraction, by estimation, is 70 to 75%. The left ventricle has hyperdynamic function. The interventricular septum is flattened in systole and diastole, consistent with right ventricular pressure and volume overload. Right Ventricle: The right ventricular size is mildly enlarged. No increase in right ventricular wall thickness. Right ventricular systolic function is mildly reduced. Pericardium: There is no evidence of pericardial effusion. Mitral Valve: The mitral valve is degenerative in appearance. Severe mitral annular calcification. Mild to moderate mitral valve regurgitation. MV peak gradient, 8.3 mmHg. Tricuspid Valve: The tricuspid valve is grossly normal. Tricuspid valve regurgitation is moderate . No evidence of tricuspid stenosis. Aortic Valve: Bioprosthetic aortic valve. No doppler assessment performed. The aortic valve has been repaired/replaced. Aortic valve regurgitation is not visualized. There is a Edwards valve present in the aortic position. Pulmonic Valve: The pulmonic valve was grossly normal. Pulmonic valve regurgitation is mild. No evidence of pulmonic stenosis. Venous: The inferior vena cava was not well visualized. Additional Comments: A device lead is visualized in the right atrium and right ventricle.  LEFT VENTRICLE PLAX 2D LVIDd:         3.30 cm LVIDs:         1.90 cm LV PW:         1.00 cm LV IVS:        0.90 cm  MITRAL VALVE MV Peak grad: 8.3 mmHg MV Vmax:      1.44 m/s MV Vmean:     80.8 cm/s Darryle Decent MD Electronically signed by Darryle Decent MD Signature Date/Time: 07/30/2023/5:25:40 PM    Final    CT CHEST WO CONTRAST Result Date: 07/30/2023 CLINICAL DATA:  Pneumonia,  complications suspected. EXAM: CT CHEST WITHOUT CONTRAST TECHNIQUE: Multidetector CT imaging of the chest was performed following the standard protocol without IV contrast. RADIATION DOSE REDUCTION: This exam was performed according to the departmental dose-optimization program which includes automated exposure control, adjustment of the mA and/or kV according to patient size and/or use of iterative reconstruction technique. COMPARISON:  Chest radiograph dated 07/29/2023 and CT dated 08/10/2022. FINDINGS: Evaluation of this exam is limited in the absence of intravenous contrast. Cardiovascular: There is no cardiomegaly. No significant pericardial effusion. Left pectoral pacing device. There is advanced 3 vessel coronary vascular calcification and calcification of the mitral annulus. There is postsurgical changes of CABG. Advanced atherosclerotic calcification of the thoracic aorta. No aneurysmal dilatation. The central pulmonary arteries are grossly unremarkable on this noncontrast CT. Mediastinum/Nodes: No obvious hilar or mediastinal adenopathy. Evaluation however is limited in the absence of intravenous contrast and due to consolidative changes of the right lung. The esophagus is grossly unremarkable. No mediastinal fluid collection. Right IJ catheter with tip in the right atrium. Lungs/Pleura: There is a somewhat loculated appearing moderate size right pleural effusion tracking into the fissures. There is compressive atelectasis of portions of the right upper, right lower and right middle lobes. Areas of airspace density at the right lung base and in the right upper lobe as well as several scattered clusters of ground-glass nodularity in the left lung concerning for pneumonia. There is slight thickening of the right lung pleural surface. And ill-defined area of increased density in the right lower lobe measures approximately 2.3 x 2.1 cm (95/3) and may represent pneumonia or proteinaceous content within the  pleural effusion. However, underlying mass is not excluded. Additionally there is a 1.7 x 0.9 cm nodular density in the right apex which  may represent scarring or a pulmonary nodule. There is no pneumothorax. The central airways are patent. Upper Abdomen: No acute abnormality. Musculoskeletal: Median sternotomy wires. No acute osseous pathology. IMPRESSION: 1. Moderate size loculated appearing right pleural effusion with partial compressive atelectasis of the right lung. 2. Findings likely represent multilobar pneumonia. Clinical correlation and follow-up to resolution recommended. 3. Ill-defined density at the right lung base may represent proteinaceous content or infiltrate. A mass/neoplasm is not excluded. Thoracentesis may provide additional diagnostic information if clinically indicated. 4. Focal area of scarring versus nodule in the right apex. Attention on follow-up imaging recommended. 5.  Aortic Atherosclerosis (ICD10-I70.0). Electronically Signed   By: Vanetta Chou M.D.   On: 07/30/2023 09:31   DG CHEST PORT 1 VIEW Result Date: 07/29/2023 CLINICAL DATA:  Follow-up pleural effusion EXAM: PORTABLE CHEST 1 VIEW COMPARISON:  07/27/2023 FINDINGS: Cardiac shadow is stable. Postsurgical changes are again seen. Pacing device and dialysis catheter are again noted. Right-sided pleural effusion is again seen slightly less than that noted on the prior exam. This may be related to the upright positioning. Left lung is clear. IMPRESSION: Right-sided pleural effusion slightly less than that seen on the prior exam although this may be positionally related. Electronically Signed   By: Oneil Devonshire M.D.   On: 07/29/2023 18:06      Impression    Rectal bleeding Chronic intermittent rectal bleeding and has been followed at [facility] gastroenterology and undergone multiple endoscopic procedures in the past for same.  Hemoglobin is at baseline (11 3).  Patient adamantly does not want to undergo procedures.  Additionally,  suspect she is not well enough to complete a colon prep and her present condition. - Continue to monitor for bleeding - If brisk bleeding or multiple episodes of rectal bleeding in a short period of time would recommend CTA - Continue daily CBC and transfuse as needed to maintain HGB > 7 - No plans to proceed with endoscopic evaluation at this time.  Will defer decision to  Dr. Ross Hefferan. Can likely resume her eliquis  per primary team  Acute metabolic encephalopathy and sepsis secondary to influenza/PNA Improving.  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 Scheduled to see them 10/15/2023 with last visit being 03/2023.  No previous history of encephalopathy, pruritus, jaundice, ascites, GI bleeding. MELD 3.0 07/29/2023: 35 (likely driven by worsening renal function) -- continue outpatient follow up with [facility] as scheduled - Continue lactulose  10 GM/15 mL twice daily - Continue Protonix  40 Mg twice daily - Continue supportive care  PAF with RVR Exacerbated by PNA/influenza.  Planning to resume Eliquis  per primary team pending GI evaluation  ESRD on HD  Thank you for your kind consultation, we will continue to follow.   Bayley CHRISTELLA Blower  07/31/2023, 10:21 AM    Attending physician's note  I have taken a history, reviewed the chart and examined the patient. I performed a substantive portion of this encounter, including complete performance of at least one of the key components, in conjunction with the APP. I agree with the APP's note, impression and recommendations.   73 year old female with multiple comorbidities including diabetes, CAD, bioprosthetic AVR, A-fib on Eliquis , end-stage renal disease on hemodialysis, Hollie cirrhosis with chronic anemia and history of intermittent GI bleed  She has had multiple endoscopic evaluations at [facility], patient does not want to undergo any procedures while she is here. Hemoglobin is overall stable, at  baseline She is recovering from influenza pneumonia Decompensated  Nash/Mash cirrhosis, MELD 35  Okay to resume Eliquis , please reconsult GI if develops large-volume hematochezia with declining hemoglobin and if patient is agreeable to proceed with endoscopic evaluation  The patient was provided an opportunity to ask questions and all were answered. The patient agreed with the plan and demonstrated an understanding of the instructions.  LOIS Wilkie Mcgee , MD [phone]

## 2023-07-31 NOTE — Progress Notes (Signed)
 Occupational Therapy Treatment Patient Details Name: [name] [name] MRN: [id number] DOB: [birth year] Today's Date: 07/31/2023   History of present illness Patient is a 73 yo female presenting to [facility] on 07/24/23 with weakness and cough, transferred to [facility] on 07/26/23 found to have encephalopathy in the setting of influenza infection. PMH includes: aortic stenosis, CHF, chronic kidney disease, diabetes, CAD   OT comments  Pt very limited by fatigue from HD in today's session. Very little energy, it could be impacting performance but regardless the pt remains generally weak. She reports not being up for ambulation but open to EOB exercises with a few STS attempts. Pt continues to have a post lean when initially standing, BP had been looking low all day but did not seem to be impacting standing balance. OT to continue to progress pt as able, next session may look into [employer]ands for UE strengthening. Pt spouse reports his familiarity with use of gait belt, provided him with one to assist the pt at DC. DC plans remain appropriate for [facility].       If plan is discharge home, recommend the following:  Help with stairs or ramp for entrance;Assist for transportation;Assistance with cooking/housework;A little help with walking and/or transfers   Equipment Recommendations  BSC/3in1    Recommendations for Other Services      Precautions / Restrictions Precautions Precautions: Fall Precaution Comments: monitor O2 Restrictions Weight Bearing Restrictions Per Provider Order: No       Mobility Bed Mobility Overal bed mobility: Needs Assistance Bed Mobility: Supine to Sit, Sit to Supine     Supine to sit: Min assist, HOB elevated, Used rails Sit to supine: Min assist   General bed mobility comments: Min A for trunk elevation then to lightly assist BLEs with return to bed. Mod A + pad to scoot to EOB    Transfers Overall transfer level: Needs assistance Equipment used: 1 person hand held  assist Transfers: Sit to/from Stand Sit to Stand: Min assist           General transfer comment: STSx3 from EOB, reinforcing cues each attempted for anterior weight shift, heels back, and chest up to rise. Slight post lean remains present     Balance Overall balance assessment: Needs assistance Sitting-balance support: No upper extremity supported, Feet supported Sitting balance-Leahy Scale: Good Sitting balance - Comments: sitting EOB   Standing balance support: Single extremity supported, During functional activity Standing balance-Leahy Scale: Poor Standing balance comment: Needs UE support for steadying                           ADL either performed or assessed with clinical judgement   ADL                                         General ADL Comments: Focused session on strenghtening, pt more sedentary today since attending HD.    Extremity/Trunk Assessment              Vision       Perception     Praxis      Cognition Arousal: Alert Behavior During Therapy: WFL for tasks assessed/performed Overall Cognitive Status: Within Functional Limits for tasks assessed  Exercises General Exercises - Lower Extremity Long Arc Quad: AROM, Both, 10 reps, Seated Hip Flexion/Marching: AROM, Both, 10 reps, Seated    Shoulder Instructions       General Comments Sp02 stable on RA, Bp reading low from hx but was reassessed at 117/52(71) sitting EOB following activity.    Pertinent Vitals/ Pain       Pain Assessment Pain Assessment: Faces Faces Pain Scale: Hurts a little bit Pain Location: R lateral thigh Pain Descriptors / Indicators: Tightness Pain Intervention(s): Limited activity within patient's tolerance, Utilized relaxation techniques, Repositioned (massage to area of tightness)  Home Living                                          Prior  Functioning/Environment              Frequency  Min 1X/week        Progress Toward Goals  OT Goals(current goals can now be found in the care plan section)  Progress towards OT goals: Progressing toward goals  Acute Rehab OT Goals Patient Stated Goal: To get stronger OT Goal Formulation: With patient Time For Goal Achievement: 08/10/23 Potential to Achieve Goals: Good  Plan      Co-evaluation                 AM-PAC OT 6 Clicks Daily Activity     Outcome Measure   Help from another person eating meals?: None Help from another person taking care of personal grooming?: A Little Help from another person toileting, which includes using toliet, bedpan, or urinal?: A Little Help from another person bathing (including washing, rinsing, drying)?: A Little Help from another person to put on and taking off regular upper body clothing?: A Little Help from another person to put on and taking off regular lower body clothing?: A Little 6 Click Score: 19    End of Session Equipment Utilized During Treatment: Gait belt  OT Visit Diagnosis: Unsteadiness on feet (R26.81);Muscle weakness (generalized) (M62.81);Other symptoms and signs involving cognitive function   Activity Tolerance Patient limited by fatigue   Patient Left in bed;with call bell/phone within reach;with bed alarm set;with family/visitor present   Nurse Communication Mobility status        Time: 1701-1726 OT Time Calculation (min): 25 min  Charges: OT General Charges $OT Visit: 1 Visit OT Treatments $Therapeutic Activity: 23-37 mins  07/31/2023  AB, OTR/L  Acute Rehabilitation Services  Office: [phone]   Curtistine JONETTA Das 07/31/2023, 5:46 PM

## 2023-08-01 ENCOUNTER — Ambulatory Visit (HOSPITAL_BASED_OUTPATIENT_CLINIC_OR_DEPARTMENT_OTHER): Payer: Medicare PPO | Admitting: Pulmonary Disease

## 2023-08-01 ENCOUNTER — Other Ambulatory Visit (HOSPITAL_COMMUNITY): Payer: Self-pay

## 2023-08-01 DIAGNOSIS — R410 Disorientation, unspecified: Secondary | ICD-10-CM | POA: Diagnosis not present

## 2023-08-01 DIAGNOSIS — J101 Influenza due to other identified influenza virus with other respiratory manifestations: Secondary | ICD-10-CM | POA: Diagnosis not present

## 2023-08-01 DIAGNOSIS — R531 Weakness: Secondary | ICD-10-CM | POA: Diagnosis not present

## 2023-08-01 DIAGNOSIS — J90 Pleural effusion, not elsewhere classified: Secondary | ICD-10-CM | POA: Diagnosis not present

## 2023-08-01 LAB — CULTURE, BODY FLUID W GRAM STAIN -BOTTLE: Culture: NO GROWTH

## 2023-08-01 LAB — RENAL FUNCTION PANEL
Albumin: 3.5 g/dL (ref 3.5–5.0)
Anion gap: 12 (ref 5–15)
BUN: 37 mg/dL — ABNORMAL HIGH (ref 8–23)
CO2: 24 mmol/L (ref 22–32)
Calcium: 9.6 mg/dL (ref 8.9–10.3)
Chloride: 93 mmol/L — ABNORMAL LOW (ref 98–111)
Creatinine, Ser: 3.93 mg/dL — ABNORMAL HIGH (ref 0.44–1.00)
GFR, Estimated: 12 mL/min — ABNORMAL LOW (ref >60)
Glucose, Bld: 185 mg/dL — ABNORMAL HIGH (ref 70–99)
Phosphorus: 3.9 mg/dL (ref 2.5–4.6)
Potassium: 3.8 mmol/L (ref 3.5–5.1)
Sodium: 129 mmol/L — ABNORMAL LOW (ref 135–145)

## 2023-08-01 LAB — CBC
HCT: 30.9 % — ABNORMAL LOW (ref 36.0–46.0)
Hemoglobin: 10.6 g/dL — ABNORMAL LOW (ref 12.0–15.0)
MCH: 30.9 pg (ref 26.0–34.0)
MCHC: 34.3 g/dL (ref 30.0–36.0)
MCV: 90.1 fL (ref 80.0–100.0)
Platelets: 148 10*3/uL — ABNORMAL LOW (ref 150–400)
RBC: 3.43 MIL/uL — ABNORMAL LOW (ref 3.87–5.11)
RDW: 14.4 % (ref 11.5–15.5)
WBC: 10.9 10*3/uL — ABNORMAL HIGH (ref 4.0–10.5)
nRBC: 0 % (ref 0.0–0.2)

## 2023-08-01 LAB — GLUCOSE, CAPILLARY: Glucose-Capillary: 167 mg/dL — ABNORMAL HIGH (ref 70–99)

## 2023-08-01 MED ORDER — BENZONATATE 200 MG PO CAPS
200.0000 mg | ORAL_CAPSULE | Freq: Three times a day (TID) | ORAL | 0 refills | Status: DC
  Filled 2023-08-01 (×2): qty 20, 7d supply, fill #0

## 2023-08-01 MED ORDER — INSULIN PEN NEEDLE 32G X 4 MM MISC
0 refills | Status: DC
  Filled 2023-08-01: qty 100, 30d supply, fill #0

## 2023-08-01 MED ORDER — INSULIN GLARGINE 100 UNIT/ML SOLOSTAR PEN
6.0000 [IU] | PEN_INJECTOR | Freq: Every day | SUBCUTANEOUS | 0 refills | Status: DC
  Filled 2023-08-01: qty 3, 28d supply, fill #0

## 2023-08-01 MED ORDER — DEXTROMETHORPHAN POLISTIREX ER 30 MG/5ML PO SUER
30.0000 mg | Freq: Every day | ORAL | 0 refills | Status: DC
  Filled 2023-08-01 (×2): qty 89, 17d supply, fill #0

## 2023-08-01 NOTE — Discharge Summary (Signed)
 PATIENT DETAILS Name: [name] [name] Age: 73 y.o. Sex: female Date of Birth: [birth year] MRN: [id number]. Admitting Physician: Dorn Dawson, MD ERE:Yjhozm, Vernell, MD  Admit Date: 07/24/2023 Discharge date: 08/01/2023  Recommendations for Outpatient Follow-up:  Follow up with PCP in 1-2 weeks Please obtain CMP/CBC in one week Please ensure follow-up with pulmonology, endocrinology Imdur  on hold-resume when able  Admitted From:  Home  Disposition: Home health   Discharge Condition: good  CODE STATUS:   Code Status: Full Code   Diet recommendation:  Diet Order             Diet - low sodium heart healthy           Diet Carb Modified Fluid consistency: Thin; Room service appropriate? Yes  Diet effective now                    Brief Summary: Patient is a 73 y.o.  female with history of ESRD on HD M/T/TH/F, DM-1 on insulin  pump, CAD s/p CABG x 2, s/p bioprosthetic AVR, A-fib on Eliquis , s/p PPM in place, RCC-s/p right nephrectomy, remote history of lymphoma-presented to [facility] on 1/1 with weakness/cough-found to have encephalopathy in the setting of influenza infection.   Significant events: 1/1>> admit to hospitalist service at [facility] 1/3>> worsening hypoxemia after vomiting-possible aspiration event 1/3>> transferred to [facility] 1/7>> hematochezia x 2   Significant studies: 1/1>> CXR: Loculated-moderate right-sided pleural effusion.  Diffuse hazy airspace disease right lung. 1/2>> CT head: No acute intracranial abnormality 1/8>> echo: EF 70-75%, bioprosthetic aortic valve in place.   Significant microbiology data: 1/1>> influenza A PCR: Positive 1/1>> COVID/influenza B/RSV PCR: Negative 1/2>> COVID PCR: Negative 1/4>> pleural fluid culture: No growth   Procedures: 1/4>> thoracocentesis by IR   Consults: Nephrology PCCM Gastroenterology  Brief Hospital Course: Acute metabolic encephalopathy Secondary to influenza infection Resolved with supportive care    Acute hypoxic respiratory failure secondary to influenza A PNA-possible aspiration PNA Resolved-now on room air.  Completed course of levofloxacin  She previously has been intolerant to Tamiflu (per pt-has had nausea/vomiting on prior 2 attempts)   Sepsis secondary to influenza/PNA Improved Cultures as above Completed a course of levofloxacin .   Loculated right-sided pleural effusion Not a new finding-present on most recent PET scan October 2024 Underwent thoracocentesis 1/4-consistent with exudate-cytology negative PCCM suspecting trapped lung Recommendations from Dr. Marsha to repeat CT imaging in 8 weeks-his office will arrange for follow-up.   PAF with RVR Triggered by PNA/influenza Maintaining sinus rhythm Continue metoprolol  Eliquis  on hold for hematochezia. Telemetry monitoring   Nausea/vomiting-aspiration event on 1/3 Following a coughing spell No further nausea/vomiting   Episode of epistaxis on 1/4 Resolved with Afrin-no epistaxis this morning.   Hematochezia x 2 on 1/7 No further episodes Hb relatively stable Eliquis  held on 1/8-evaluated by GI-patient not keen on pursuing endoscopy-per GI okay to resume Eliquis -which was resumed 1/8 evening-no further hematochezia-Hb continues to be relatively stable.   ESRD on HD M/T/T/F Nephrology followed closely during this hospitalization.   Chronic HFpEF Euvolemic Volume removal with HD   Hyponatremia Managed with volume removal with HD.   Sick sinus syndrome-s/p PPM implantation   CAD s/p CABG x 2 (2005/2011) and bioprosthetic AVR 2011 ASA held due to hematochezia-will hold on discharge-and just continue anticoagulation. Continue metoprolol . Resume Repatha  injections postdischarge   HTN BP stable on metoprolol  Resume Imdur  when BP is a bit more stable.   DM-1 (A1c 6.7 on 1/1) CBG stable on just 6 of  Semglee  Insulin  pump on hold-I have asked her and spouse to touch base with the primary  endocrinologist-and then resume insulin  pump at his instructions.  She requires significantly less basal regimen at this point compared to her baseline-suspect as she slowly improves/debility improves-she will require more insulin .  NASH liver cirrhoses Compensated Follows with [facility]   GERD Pepcid    Mood disorder Stable Xanax /Lexapro    Restless leg/peripheral neuropathy Continue Neurontin    History of Hodgkin's lymphoma S/p mantle radiation 1981-in remission since then Follows with oncology   Renal cell carcinoma-s/p right nephrectomy 2008 Follows with oncology   Left breast DCIS-s/p left mastectomy 2016 Follows with oncology   Debility/deconditioning PT/OT eval ordered-Home health recommended.   OSA BiPAP   Underweight: Estimated body mass index is 19.23 kg/m as calculated from the following:   Height as of this encounter: 5' 2 (1.575 m).   Weight as of this encounter: 47.7 kg.    Discharge Diagnoses:  Principal Problem:   Influenza A Active Problems:   Weakness   Discharge Instructions:  Activity:  As tolerated with Full fall precautions use walker/cane & assistance as needed   Discharge Instructions     Call MD for:  difficulty breathing, headache or visual disturbances   Complete by: As directed    Call MD for:  extreme fatigue   Complete by: As directed    Call MD for:  persistant dizziness or light-headedness   Complete by: As directed    Diet - low sodium heart healthy   Complete by: As directed    Discharge instructions   Complete by: As directed    Follow with Primary MD  Rolinda Millman, MD in 1-2 weeks  Please touch base/talk with your primary endocrinologist and resume insulin  pump based on endocrinologist advice.  You will phone call from pulmonology clinic for a follow-up appointment-if you do not hear from them-please give them a call.  Please get a complete blood count and chemistry panel checked by your Primary MD at your next  visit, and again as instructed by your Primary MD.  Get Medicines reviewed and adjusted: Please take all your medications with you for your next visit with your Primary MD  Laboratory/radiological data: Please request your Primary MD to go over all hospital tests and procedure/radiological results at the follow up, please ask your Primary MD to get all Hospital records sent to his/her office.  In some cases, they will be blood work, cultures and biopsy results pending at the time of your discharge. Please request that your primary care M.D. follows up on these results.  Also Note the following: If you experience worsening of your admission symptoms, develop shortness of breath, life threatening emergency, suicidal or homicidal thoughts you must seek medical attention immediately by calling 911 or calling your MD immediately  if symptoms less severe.  You must read complete instructions/literature along with all the possible adverse reactions/side effects for all the Medicines you take and that have been prescribed to you. Take any new Medicines after you have completely understood and accpet all the possible adverse reactions/side effects.   Do not drive when taking Pain medications or sleeping medications (Benzodaizepines)  Do not take more than prescribed Pain, Sleep and Anxiety Medications. It is not advisable to combine anxiety,sleep and pain medications without talking with your primary care practitioner  Special Instructions: If you have smoked or chewed Tobacco  in the last 2 yrs please stop smoking, stop any regular Alcohol  and or  any Recreational drug use.  Wear Seat belts while driving.  Please note: You were cared for by a hospitalist during your hospital stay. Once you are discharged, your primary care physician will handle any further medical issues. Please note that NO REFILLS for any discharge medications will be authorized once you are discharged, as it is imperative that you  return to your primary care physician (or establish a relationship with a primary care physician if you do not have one) for your post hospital discharge needs so that they can reassess your need for medications and monitor your lab values.   Increase activity slowly   Complete by: As directed       Allergies as of 08/01/2023       Reactions   Avandia [rosiglitazone Maleate] Other (See Comments)   Congestive heart failure   Cephalexin Anaphylaxis, Swelling, Rash   Extremities swell , throat swelling   Clindamycin Anaphylaxis, Shortness Of Breath   esophageal spasms   Humalog [insulin  Lispro] Itching    big knots and whelp   Lincomycin Anaphylaxis, Shortness Of Breath   Metformin Other (See Comments)   Congestive heart failure   Novolog  [insulin  Aspart (human Analog)] Hives, Itching   Humalog & Novolog  big knots and whelp   Penicillins Anaphylaxis, Hives, Swelling, Rash   Swelling of the face/tongue/throat, SOB, or low BP Childhood allergy   Tizanidine Other (See Comments)   Dizziness, Mental Status Changes, Hallucination   Adhesive [tape] Other (See Comments)   Tears skin, Please use paper tape   Atorvastatin Other (See Comments)   increased LFT's, no muscle weakness, leg pain   Cholestyramine Other (See Comments)   Liver Disorder Elevated LFTs   Gentamycin [gentamicin ] Hives, Itching, Rash   Fine red bumps.  Reaction noted post loop recorder implant, where gentamycin was used for irrigation. Topical prep   Iodinated Contrast Media Other (See Comments)   PAIN DURING LYMPHANGIOGRAM '81, NO ALLERGY TO IV CONTRAST.  Has had procedures with Iodine since without problems.   Limonene Hives, Itching, Rash, Other (See Comments)   Reaction noted post loop recorder implant, where gentamycin was used for irrigation. Topical prep   Prednisone  Itching, Other (See Comments)   B/P went high, itching all over.  Tolerates with benadryl     Ranexa  [ranolazine ] Other (See Comments)   Nausea,  dizziness, low blood sugar, tingling in right hand, blood blisters on tongue    Rosuvastatin Nausea And Vomiting, Nausea Only, Other (See Comments)   Muscle and joint pain & increased LFTs; tolerates Pravastatin  10mg  (max)   Simvastatin  Other (See Comments)   Increased LFT's   Zinc     Unknown   Acetaminophen     Pt told not to take tylenol  due to liver issues   Azelastine  Other (See Comments)   Metallic taste,spitting up blood   Clopidogrel  Other (See Comments)   Does not work for patient Medicine was not effective   Kerendia  [finerenone ] Hives, Nausea Only   Lisinopril  Diarrhea, Nausea Only, Other (See Comments)   Neosporin [bacitracin -polymyxin B ] Dermatitis   Codeine Nausea And Vomiting   Erythromycin  Nausea And Vomiting   Hydrocodone -acetaminophen  Itching, Rash, Other (See Comments)   itching   Latex Itching, Other (See Comments)   Iif wear gloves hands get red ,itchy no problem if other wear and touch me   Neomycin Rash, Other (See Comments)   Redness   Nickel Itching   Ranitidine Nausea Only   Tamiflu [oseltamivir] Nausea And Vomiting   Tramadol Nausea Only  Medication List     PAUSE taking these medications    Apidra  100 UNIT/ML injection Wait to take this until your doctor or other care provider tells you to start again. Generic drug: insulin  glulisine Inject 40 Units into the skin as directed. INFUSE THROUGH INSULIN  PUMP UTD, Bolus depends on carb intake       STOP taking these medications    aspirin  EC 81 MG tablet   isosorbide  mononitrate 120 MG 24 hr tablet Commonly known as: IMDUR        TAKE these medications    ALPRAZolam  0.5 MG tablet Commonly known as: XANAX  Take 1 tablet (0.5 mg total) by mouth 2 (two) times daily as needed for anxiety.   apixaban  5 MG Tabs tablet Commonly known as: ELIQUIS  Take 1 tablet (5 mg total) by mouth 2 (two) times daily.   benzonatate  200 MG capsule Commonly known as: TESSALON  Take 1 capsule (200  mg total) by mouth 3 (three) times daily.   Co Q 10 100 MG Caps Take 100 mg by mouth at bedtime.   colchicine  0.6 MG tablet Take 0.6 mg by mouth daily as needed (gout flare).   denosumab  60 MG/ML Sosy injection Commonly known as: PROLIA  Inject 60 mg into the skin every 6 (six) months.   dextromethorphan  30 MG/5ML liquid Commonly known as: DELSYM  Take 5 mLs (30 mg total) by mouth at bedtime.   escitalopram  5 MG tablet Commonly known as: LEXAPRO  Take 5 mg by mouth at bedtime.   famotidine  40 MG tablet Commonly known as: PEPCID  Take 40 mg by mouth at bedtime.   gabapentin  100 MG capsule Commonly known as: NEURONTIN  Take 100 mg by mouth daily.   Glucagon  Emergency 1 MG Kit Inject 1 mg into the vein once as needed (low blood sugar).   insulin  glargine 100 UNIT/ML Solostar Pen Commonly known as: LANTUS  Inject 6 Units into the skin daily. Start taking on: August 02, 2023   latanoprost  0.005 % ophthalmic solution Commonly known as: XALATAN  Place 1 drop into both eyes at bedtime.   metoprolol  succinate 25 MG 24 hr tablet Commonly known as: Toprol  XL Take 0.5 tablets (12.5 mg total) by mouth daily.   midodrine  5 MG tablet Commonly known as: PROAMATINE  Take 1 tablet (5 mg total) by mouth daily as needed (Prior to HD).   mometasone  50 MCG/ACT nasal spray Commonly known as: NASONEX  Place 1 spray into the nose daily as needed (allergies).   nitroGLYCERIN  0.4 MG/SPRAY spray Commonly known as: NITROLINGUAL  PLACE 1 SPRAY UNDER TH TONGUE EVERY 5 MINUTES FOR 3 DOSES AS NEEDED FOR CHEST PAIN   polyethylene glycol 17 g packet Commonly known as: MIRALAX  / GLYCOLAX  Take 17 g by mouth in the morning. Hold for loose stools   potassium chloride  SA 20 MEQ tablet Commonly known as: KLOR-CON  M Take 1 tablet (20 mEq total) by mouth 2 (two) times daily.   Repatha  SureClick 140 MG/ML Soaj Generic drug: Evolocumab  INJECT 140MG  INTO THE SKIN EVERY 14 DAYS   Uloric  40 MG  tablet Generic drug: febuxostat  Take 1 tablet (40 mg total) by mouth at bedtime.   Vascepa  1 g capsule Generic drug: icosapent  Ethyl TAKE TWO CAPSULES (2 GRAMS TOTAL) BY MOUTH TWO TIMES DAILY        Follow-up Information     Rolinda Millman, MD. Schedule an appointment as soon as possible for a visit in 1 week(s).   Specialty: Family Medicine Contact information: [address] [place]  [place] [address] [phone]         [facility] Follow up.   Specialty: Pulmonology Why: Hospital follow up, Office will call with date/time, If you dont hear from them,please give them a call Contact information: [address] [place]  [phone]               Allergies  Allergen Reactions   Avandia [Rosiglitazone Maleate] Other (See Comments)    Congestive heart failure   Cephalexin Anaphylaxis, Swelling and Rash    Extremities swell , throat swelling    Clindamycin Anaphylaxis and Shortness Of Breath    esophageal spasms   Humalog [Insulin  Lispro] Itching     big knots and whelp    Lincomycin Anaphylaxis and Shortness Of Breath   Metformin Other (See Comments)    Congestive heart failure   Novolog  [Insulin  Aspart (Human Analog)] Hives and Itching    Humalog & Novolog  big knots and whelp     Penicillins Anaphylaxis, Hives, Swelling and Rash    Swelling of the face/tongue/throat, SOB, or low BP Childhood allergy   Tizanidine Other (See Comments)    Dizziness, Mental Status Changes, Hallucination     Adhesive [Tape] Other (See Comments)    Tears skin, Please use paper tape   Atorvastatin Other (See Comments)    increased LFT's, no muscle weakness, leg pain   Cholestyramine Other (See Comments)    Liver Disorder Elevated LFTs     Gentamycin [Gentamicin ] Hives, Itching and Rash    Fine red bumps.  Reaction noted post loop recorder implant, where gentamycin was used for  irrigation. Topical prep   Iodinated Contrast Media Other (See Comments)    PAIN DURING LYMPHANGIOGRAM '81, NO ALLERGY TO IV CONTRAST.  Has had procedures with Iodine since without problems.    Limonene Hives, Itching, Rash and Other (See Comments)    Reaction noted post loop recorder implant, where gentamycin was used for irrigation. Topical prep   Prednisone  Itching and Other (See Comments)    B/P went high, itching all over.  Tolerates with benadryl     Ranexa  [Ranolazine ] Other (See Comments)    Nausea, dizziness, low blood sugar, tingling in right hand, blood blisters on tongue    Rosuvastatin Nausea And Vomiting, Nausea Only and Other (See Comments)    Muscle and joint pain & increased LFTs; tolerates Pravastatin  10mg  (max)   Simvastatin  Other (See Comments)    Increased LFT's    Zinc      Unknown   Acetaminophen      Pt told not to take tylenol  due to liver issues   Azelastine  Other (See Comments)    Metallic taste,spitting up blood   Clopidogrel  Other (See Comments)    Does not work for patient Medicine was not effective   Kerendia  [Finerenone ] Hives and Nausea Only   Lisinopril  Diarrhea, Nausea Only and Other (See Comments)   Neosporin [Bacitracin -Polymyxin B ] Dermatitis   Codeine Nausea And Vomiting   Erythromycin  Nausea And Vomiting   Hydrocodone -Acetaminophen  Itching, Rash and Other (See Comments)    itching   Latex Itching and Other (See Comments)    Iif wear gloves hands get red ,itchy no problem if other wear and touch me   Neomycin Rash and Other (See Comments)    Redness   Nickel Itching   Ranitidine Nausea Only   Tamiflu [Oseltamivir] Nausea And Vomiting   Tramadol Nausea Only     Other Procedures/Studies: ECHOCARDIOGRAM  LIMITED Result Date: 07/30/2023    ECHOCARDIOGRAM LIMITED REPORT   Patient Name:   [name] [name] Date of Exam: 07/30/2023 Medical Rec #:  [id number]         Height:       62.0 in Accession #:    [id number]        Weight:       122.4 lb  Date of Birth:  1951-04-08         BSA:          1.551 m Patient Age:    72 years          BP:           96/47 mmHg Patient Gender: F                 HR:           70 bpm. Exam Location:  Inpatient Procedure: Limited Echo, Cardiac Doppler and Color Doppler Indications:    Endocarditis  History:        Patient has prior history of Echocardiogram examinations, most                 recent 07/12/2023. Risk Factors:Hypertension and Dyslipidemia.                 Aortic Valve: Edwards valve is present in the aortic position.  Sonographer:    Carmelita Hartshorn RDCS, FE, PE Referring Phys: [id number] TORIBIO JAYSON SMITH IMPRESSIONS  1. Left ventricular ejection fraction, by estimation, is 70 to 75%. The left ventricle has hyperdynamic function. There is the interventricular septum is flattened in systole and diastole, consistent with right ventricular pressure and volume overload.  2. Right ventricular systolic function is mildly reduced. The right ventricular size is mildly enlarged.  3. The mitral valve is degenerative. Mild to moderate mitral valve regurgitation. Severe mitral annular calcification.  4. Tricuspid valve regurgitation is moderate.  5. Bioprosthetic aortic valve. No doppler assessment performed. The aortic valve has been repaired/replaced. Aortic valve regurgitation is not visualized. There is a Edwards valve present in the aortic position. Conclusion(s)/Recommendation(s): No evidence of valvular vegetations on this transthoracic echocardiogram. Consider a transesophageal echocardiogram to exclude infective endocarditis if clinically indicated. FINDINGS  Left Ventricle: Left ventricular ejection fraction, by estimation, is 70 to 75%. The left ventricle has hyperdynamic function. The interventricular septum is flattened in systole and diastole, consistent with right ventricular pressure and volume overload. Right Ventricle: The right ventricular size is mildly enlarged. No increase in right ventricular wall thickness.  Right ventricular systolic function is mildly reduced. Pericardium: There is no evidence of pericardial effusion. Mitral Valve: The mitral valve is degenerative in appearance. Severe mitral annular calcification. Mild to moderate mitral valve regurgitation. MV peak gradient, 8.3 mmHg. Tricuspid Valve: The tricuspid valve is grossly normal. Tricuspid valve regurgitation is moderate . No evidence of tricuspid stenosis. Aortic Valve: Bioprosthetic aortic valve. No doppler assessment performed. The aortic valve has been repaired/replaced. Aortic valve regurgitation is not visualized. There is a Edwards valve present in the aortic position. Pulmonic Valve: The pulmonic valve was grossly normal. Pulmonic valve regurgitation is mild. No evidence of pulmonic stenosis. Venous: The inferior vena cava was not well visualized. Additional Comments: A device lead is visualized in the right atrium and right ventricle.  LEFT VENTRICLE PLAX 2D LVIDd:         3.30 cm LVIDs:         1.90 cm LV PW:  1.00 cm LV IVS:        0.90 cm  MITRAL VALVE MV Peak grad: 8.3 mmHg MV Vmax:      1.44 m/s MV Vmean:     80.8 cm/s Darryle Decent MD Electronically signed by Darryle Decent MD Signature Date/Time: 07/30/2023/5:25:40 PM    Final    CT CHEST WO CONTRAST Result Date: 07/30/2023 CLINICAL DATA:  Pneumonia, complications suspected. EXAM: CT CHEST WITHOUT CONTRAST TECHNIQUE: Multidetector CT imaging of the chest was performed following the standard protocol without IV contrast. RADIATION DOSE REDUCTION: This exam was performed according to the departmental dose-optimization program which includes automated exposure control, adjustment of the mA and/or kV according to patient size and/or use of iterative reconstruction technique. COMPARISON:  Chest radiograph dated 07/29/2023 and CT dated 08/10/2022. FINDINGS: Evaluation of this exam is limited in the absence of intravenous contrast. Cardiovascular: There is no cardiomegaly. No significant  pericardial effusion. Left pectoral pacing device. There is advanced 3 vessel coronary vascular calcification and calcification of the mitral annulus. There is postsurgical changes of CABG. Advanced atherosclerotic calcification of the thoracic aorta. No aneurysmal dilatation. The central pulmonary arteries are grossly unremarkable on this noncontrast CT. Mediastinum/Nodes: No obvious hilar or mediastinal adenopathy. Evaluation however is limited in the absence of intravenous contrast and due to consolidative changes of the right lung. The esophagus is grossly unremarkable. No mediastinal fluid collection. Right IJ catheter with tip in the right atrium. Lungs/Pleura: There is a somewhat loculated appearing moderate size right pleural effusion tracking into the fissures. There is compressive atelectasis of portions of the right upper, right lower and right middle lobes. Areas of airspace density at the right lung base and in the right upper lobe as well as several scattered clusters of ground-glass nodularity in the left lung concerning for pneumonia. There is slight thickening of the right lung pleural surface. And ill-defined area of increased density in the right lower lobe measures approximately 2.3 x 2.1 cm (95/3) and may represent pneumonia or proteinaceous content within the pleural effusion. However, underlying mass is not excluded. Additionally there is a 1.7 x 0.9 cm nodular density in the right apex which may represent scarring or a pulmonary nodule. There is no pneumothorax. The central airways are patent. Upper Abdomen: No acute abnormality. Musculoskeletal: Median sternotomy wires. No acute osseous pathology. IMPRESSION: 1. Moderate size loculated appearing right pleural effusion with partial compressive atelectasis of the right lung. 2. Findings likely represent multilobar pneumonia. Clinical correlation and follow-up to resolution recommended. 3. Ill-defined density at the right lung base may  represent proteinaceous content or infiltrate. A mass/neoplasm is not excluded. Thoracentesis may provide additional diagnostic information if clinically indicated. 4. Focal area of scarring versus nodule in the right apex. Attention on follow-up imaging recommended. 5.  Aortic Atherosclerosis (ICD10-I70.0). Electronically Signed   By: Vanetta Chou M.D.   On: 07/30/2023 09:31   DG CHEST PORT 1 VIEW Result Date: 07/29/2023 CLINICAL DATA:  Follow-up pleural effusion EXAM: PORTABLE CHEST 1 VIEW COMPARISON:  07/27/2023 FINDINGS: Cardiac shadow is stable. Postsurgical changes are again seen. Pacing device and dialysis catheter are again noted. Right-sided pleural effusion is again seen slightly less than that noted on the prior exam. This may be related to the upright positioning. Left lung is clear. IMPRESSION: Right-sided pleural effusion slightly less than that seen on the prior exam although this may be positionally related. Electronically Signed   By: Oneil Devonshire M.D.   On: 07/29/2023 18:06   US  THORACENTESIS  ASP PLEURAL SPACE W/IMG GUIDE Result Date: 07/27/2023 INDICATION: 73 year old female with cough, shortness of breath, loculated right pleural effusion. EXAM: ULTRASOUND GUIDED RIGHT THORACENTESIS MEDICATIONS: 10 mL 1% lidocaine  COMPLICATIONS: None immediate. PROCEDURE: An ultrasound guided thoracentesis was thoroughly discussed with the patient and questions answered. The benefits, risks, alternatives and complications were also discussed. The patient understands and wishes to proceed with the procedure. Written consent was obtained. Ultrasound was performed to localize and mark an adequate pocket of fluid in the right chest. The area was then prepped and draped in the normal sterile fashion. 1% Lidocaine  was used for local anesthesia. Under ultrasound guidance a 6 Fr Safe-T-Centesis catheter was introduced. Thoracentesis was performed. The catheter was removed and a dressing applied. FINDINGS: A total  of approximately 400 mL of amber fluid was removed. Samples were sent to the laboratory as requested by the clinical team. IMPRESSION: Successful ultrasound guided right thoracentesis yielding 400 mL of pleural fluid. Performed by: Kacie Matthews PA-C Electronically Signed   By: Ester Sides M.D.   On: 07/27/2023 16:13   DG Chest 1 View Result Date: 07/27/2023 CLINICAL DATA:  Pleural effusion EXAM: CHEST  1 VIEW COMPARISON:  X-ray 07/27/2023 earlier FINDINGS: Decreasing right pleural effusion with significant residual post thoracentesis. No pneumothorax. Persistent patchy right lung opacities. Stable enlarged cardiopericardial silhouette with sternal wires. Right IJ double-lumen catheter in place once again with left upper chest pacemaker. Vascular congestion interstitial changes. No left-sided pleural effusion. IMPRESSION: Decreasing right pleural effusion with significant residual. No pneumothorax post thoracentesis. Electronically Signed   By: Ranell Bring M.D.   On: 07/27/2023 12:30   DG Chest Port 1V same Day Result Date: 07/27/2023 CLINICAL DATA:  Shortness of breath. EXAM: PORTABLE CHEST 1 VIEW COMPARISON:  Chest radiograph dated 07/26/2023. FINDINGS: The heart size and mediastinal contours are within normal limits. Vascular calcifications are seen in the aortic arch. There is a moderate to large right pleural effusion with associated atelectasis/airspace disease. There is mild bilateral interstitial opacities. No pneumothorax. A right internal jugular central venous catheter tip overlies the right atrium. A left subclavian approach cardiac device is redemonstrated. IMPRESSION: 1. Moderate to large right pleural effusion with associated atelectasis/airspace disease. 2. Mild bilateral interstitial opacities may reflect pulmonary edema. Electronically Signed   By: Norman Hopper M.D.   On: 07/27/2023 11:09   DG CHEST PORT 1 VIEW Result Date: 07/26/2023 CLINICAL DATA:  73 year old female with history of  hypoxia and hemoptysis. EXAM: PORTABLE CHEST 1 VIEW COMPARISON:  Chest x-ray 07/24/2023. FINDINGS: Right internal jugular PermCath with tip projecting over the superior aspect of the right atrium. Left-sided pacemaker device in place with lead tips projecting over the right atrium and right ventricular apex. Status post median sternotomy for CABG and aortic valve replacement (a stented bioprosthesis is noted). Lung volumes are low. Irregular opacities throughout the periphery of the right mid to lower hemithorax most compatible with a partially loculated right-sided pleural effusion, similar to the recent prior study. Ill-defined opacities throughout the right lung, particularly in the right mid to lower lung, which likely reflect areas of underlying atelectasis and/or consolidation. Left lung appears clear. No left pleural effusion. No pneumothorax. No evidence of pulmonary edema. Heart size is upper limits of normal. Atherosclerotic calcifications in the thoracic aorta. IMPRESSION: 1. Moderate partially loculated right-sided pleural effusion with extensive areas of atelectasis and/or consolidation throughout the right mid to lower lung, similar to the recent prior study. 2. Aortic atherosclerosis. 3. Postoperative changes and support  apparatus, as above. Electronically Signed   By: Toribio Aye M.D.   On: 07/26/2023 05:36   CT HEAD WO CONTRAST ( ) Result Date: 07/25/2023 CLINICAL DATA:  AMS, dysarthria. Hx fall on AC. r/o ICH EXAM: CT HEAD WITHOUT CONTRAST TECHNIQUE: Contiguous axial images were obtained from the base of the skull through the vertex without intravenous contrast. RADIATION DOSE REDUCTION: This exam was performed according to the departmental dose-optimization program which includes automated exposure control, adjustment of the mA and/or kV according to patient size and/or use of iterative reconstruction technique. COMPARISON:  CT head November 15, 2016. FINDINGS: Brain: No evidence of acute  infarction, hemorrhage, hydrocephalus, extra-axial collection or mass lesion/mass effect. Vascular: No hyperdense vessel identified. Skull: No acute fracture. Sinuses/Orbits: Clear sinuses.  No acute orbital findings. Other: No mastoid effusions. IMPRESSION: No evidence of acute intracranial abnormality. Electronically Signed   By: Gilmore GORMAN Molt M.D.   On: 07/25/2023 03:43   DG Chest Portable 1 View Result Date: 07/24/2023 CLINICAL DATA:  new cough EXAM: PORTABLE CHEST 1 VIEW COMPARISON:  Chest x-ray 05/01/2023 FINDINGS: Left chest wall dual lead pacemaker. Right chest wall dialysis catheter with tip overlying the right atrium. The heart and mediastinal contours are unchanged. Atherosclerotic plaque. Coronary artery stents. Interval development of likely loculated moderate volume right pleural effusion. Diffuse hazy airspace opacity of the right lung. No pulmonary edema. No left pleural effusion. Left lung grossly unremarkable. No pneumothorax. No acute osseous abnormality. IMPRESSION: 1. Interval development of likely loculated moderate volume right pleural effusion. Diffuse hazy airspace opacity of the right lung may represent infection/inflammation versus atelectasis. 2.  Aortic Atherosclerosis (ICD10-I70.0). Electronically Signed   By: Morgane  Naveau M.D.   On: 07/24/2023 22:30   VAS US  DUPLEX DIALYSIS ACCESS (AVF, AVG) Result Date: 07/23/2023 DIALYSIS ACCESS Patient Name:  [name] [name]  Date of Exam:   07/22/2023 Medical Rec #: [id number]          Accession #:    [id number] Date of Birth: 1951/03/11          Patient Gender: F Patient Age:   34 years Exam Location:  [facility] Procedure:      VAS US  DUPLEX DIALYSIS ACCESS (AVF, AVG) Referring Phys: GAILE NEW --------------------------------------------------------------------------------  Reason for Exam: Routine follow up. Access Site: Right Upper Extremity. Access Type: Brachial-Basilic. History: CKD, HCC. Performing  Technologist: Camellia Bolder  Examination Guidelines: A complete evaluation includes B-mode imaging, spectral Doppler, color Doppler, and power Doppler as needed of all accessible portions of each vessel. Unilateral testing is considered an integral part of a complete examination. Limited examinations for reoccurring indications may be performed as noted.  Findings: Right Upper AVF +--------------------+----------+-----------------+--------+ AVF                 PSV (cm/s)Flow Vol (mL/min)Comments +--------------------+----------+-----------------+--------+ Native artery inflow   255           347                +--------------------+----------+-----------------+--------+ AVF Anastomosis        493                     0.19 cm  +--------------------+----------+-----------------+--------+  +------------+----------+-------------+----------+--------+ OUTFLOW VEINPSV (cm/s)Diameter (cm)Depth (cm)Describe +------------+----------+-------------+----------+--------+ Shoulder        81        0.70        0.83            +------------+----------+-------------+----------+--------+ Prox UA  79        0.66        0.94            +------------+----------+-------------+----------+--------+ Mid UA          83        0.67        0.77            +------------+----------+-------------+----------+--------+ Dist UA        446        0.71        0.61            +------------+----------+-------------+----------+--------+   Summary: - Patent right upper extremity arteriovenous fistula. - Velocities less than 100 cm/s noted - Volume Flow: 347 mL/min.  *See table(s) above for measurements and observations.  Diagnosing physician: Norman Serve Electronically signed by Norman Serve on 07/23/2023 at 8:39:55 AM.   --------------------------------------------------------------------------------   Final    ECHOCARDIOGRAM COMPLETE Result Date: 07/12/2023    ECHOCARDIOGRAM REPORT   Patient Name:    [name] [name] Date of Exam: 07/12/2023 Medical Rec #:  [id number]         Height:       62.0 in Accession #:    [id number]        Weight:       116.4 lb Date of Birth:  Mar 18, 1951         BSA:          1.519 m Patient Age:    72 years          BP:           115/64 mmHg Patient Gender: F                 HR:           82 bpm. Exam Location:  Outpatient Procedure: Color Doppler and Cardiac Doppler Indications:    I50.9 CHF  History:        Patient has prior history of Echocardiogram examinations. CAD,                 Pacemaker and 19mm Edwards AVR, CKD on Dialysis,                 Arrythmias:PAFIB; Risk Factors:Breast CA and Dyslipidemia.  Sonographer:    L. 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 Global longitudinal strain was attempted. IMPRESSIONS  1. Left ventricular ejection fraction, by estimation, is 60 to 65%. The left ventricle has normal function. The left ventricle has no regional wall motion abnormalities. Left ventricular diastolic parameters are indeterminate.  2. Right ventricular systolic function is normal. The right ventricular size is normal. There is severely elevated pulmonary artery systolic pressure.  3. Left atrial size was moderately dilated.  4. Severe MAC mean gradient 13 peak 29 mmHg at HR of 92 bpm. The mitral valve is abnormal. Mild to moderate mitral valve regurgitation. Moderate mitral stenosis. Severe mitral annular calcification.  5. Cannot tell if PPM wire contributes to TR Device leads in RA/RV . The tricuspid valve is abnormal. Tricuspid valve regurgitation is severe.  6. 19 mm bioprosthetic AVR Calcified leaflets gradients have increased since prior TTE no AR/PVL . The aortic valve has been repaired/replaced. Aortic valve regurgitation is not visualized. No aortic stenosis is present.  7. The inferior vena cava is normal in size with greater than 50% respiratory variability, suggesting  right atrial pressure of  3 mmHg. FINDINGS  Left Ventricle: Left ventricular ejection fraction, by estimation, is 60 to 65%. The left ventricle has normal function. The left ventricle has no regional wall motion abnormalities. Global longitudinal strain performed but not reported based on interpreter judgement due to suboptimal tracking. The left ventricular internal cavity size was normal in size. There is no left ventricular hypertrophy. Left ventricular diastolic parameters are indeterminate. Right Ventricle: The right ventricular size is normal. No increase in right ventricular wall thickness. Right ventricular systolic function is normal. There is severely elevated pulmonary artery systolic pressure. The tricuspid regurgitant velocity is 3.73 m/s, and with an assumed right atrial pressure of 15 mmHg, the estimated right ventricular systolic pressure is 70.7 mmHg. Left Atrium: Left atrial size was moderately dilated. Right Atrium: Right atrial size was normal in size. Pericardium: There is no evidence of pericardial effusion. Mitral Valve: Severe MAC mean gradient 13 peak 29 mmHg at HR of 92 bpm. The mitral valve is abnormal. There is severe thickening of the mitral valve leaflet(s). There is severe calcification of the mitral valve leaflet(s). Severe mitral annular calcification. Mild to moderate mitral valve regurgitation. Moderate mitral valve stenosis. MV peak gradient, 29.2 mmHg. The mean mitral valve gradient is 13.0 mmHg. Tricuspid Valve: Cannot tell if PPM wire contributes to TR Device leads in RA/RV. The tricuspid valve is abnormal. Tricuspid valve regurgitation is severe. No evidence of tricuspid stenosis. Aortic Valve: 19 mm bioprosthetic AVR Calcified leaflets gradients have increased since prior TTE no AR/PVL. The aortic valve has been repaired/replaced. Aortic valve regurgitation is not visualized. No aortic stenosis is present. Aortic valve mean gradient measures 13.0 mmHg. Aortic valve peak  gradient measures 23.6 mmHg. Aortic valve area, by VTI measures 0.84 cm. Pulmonic Valve: The pulmonic valve was normal in structure. Pulmonic valve regurgitation is not visualized. No evidence of pulmonic stenosis. Aorta: The aortic root is normal in size and structure. Venous: The inferior vena cava is normal in size with greater than 50% respiratory variability, suggesting right atrial pressure of 3 mmHg. IAS/Shunts: No atrial level shunt detected by color flow Doppler.  LEFT VENTRICLE PLAX 2D LVIDd:         3.00 cm     Diastology LVIDs:         2.00 cm     LV e' medial:    3.50 cm/s LV PW:         0.90 cm     LV E/e' medial:  38.6 LV IVS:        1.00 cm     LV e' lateral:   11.00 cm/s LVOT diam:     1.70 cm     LV E/e' lateral: 12.3 LV SV:         35 LV SV Index:   23 LVOT Area:     2.27 cm  LV Volumes (MOD) LV vol d, MOD A2C: 36.5 ml LV vol d, MOD A4C: 15.1 ml LV vol s, MOD A2C: 4.4 ml LV vol s, MOD A4C: 6.4 ml LV SV MOD A2C:     32.1 ml LV SV MOD A4C:     15.1 ml LV SV MOD BP:      19.2 ml RIGHT VENTRICLE RV Basal diam:  3.40 cm RV S prime:     4.73 cm/s LEFT ATRIUM             Index        RIGHT ATRIUM  Index LA diam:        4.10 cm 2.70 cm/m   RA Area:     11.90 cm LA Vol (A2C):   81.0 ml 53.33 ml/m  RA Volume:   26.70 ml  17.58 ml/m LA Vol (A4C):   66.8 ml 43.98 ml/m LA Biplane Vol: 77.1 ml 50.76 ml/m  AORTIC VALVE                     PULMONIC VALVE AV Area (Vmax):    0.81 cm      PV Vmax:          0.84 m/s AV Area (Vmean):   0.84 cm      PV Peak grad:     2.8 mmHg AV Area (VTI):     0.84 cm      PR End Diast Vel: 8.18 msec AV Vmax:           243.00 cm/s AV Vmean:          169.000 cm/s AV VTI:            0.415 m AV Peak Grad:      23.6 mmHg AV Mean Grad:      13.0 mmHg LVOT Vmax:         86.30 cm/s LVOT Vmean:        62.700 cm/s LVOT VTI:          0.154 m LVOT/AV VTI ratio: 0.37  AORTA Ao Root diam: 2.50 cm Ao Asc diam:  2.30 cm MITRAL VALVE                TRICUSPID VALVE MV Area VTI:   0.97 cm      TR Peak grad:   55.7 mmHg MV Peak grad: 29.2 mmHg     TR Vmax:        373.00 cm/s MV Mean grad: 13.0 mmHg MV Vmax:      2.70 m/s      SHUNTS MV Vmean:     161.0 cm/s    Systemic VTI:  0.15 m MV E velocity: 135.00 cm/s  Systemic Diam: 1.70 cm MV A velocity: 233.00 cm/s MV E/A ratio:  0.58 Maude Emmer MD Electronically signed by Maude Emmer MD Signature Date/Time: 07/12/2023/9:44:23 AM    Final    CUP PACEART REMOTE DEVICE CHECK Result Date: 07/04/2023 Scheduled remote reviewed. Normal device function.  3 VHR's, 3-7sec in duration, 1 irregular R-R, 2 likely noise AF burden 0.4%, longest 8hr 6 min, overall controlled rates. No OAC due to history of bleeding Next remote 91 days LA, CVRS    TODAY-DAY OF DISCHARGE:  Subjective:   [name] [name] today has no headache,no chest abdominal pain,no new weakness tingling or numbness, feels much better wants to go home today.   Objective:   Blood pressure (!) 110/55, pulse 95, temperature 98 F (36.7 C), temperature source Oral, resp. rate 20, height 5' 2 (1.575 m), weight 45.8 kg, SpO2 93%.  Intake/Output Summary (Last 24 hours) at 08/01/2023 1043 Last data filed at 08/01/2023 0835 Gross per 24 hour  Intake 3 ml  Output 1900 ml  Net -1897 ml   Filed Weights   07/30/23 0500 07/31/23 0828 07/31/23 1208  Weight: 55.5 kg 47.7 kg 45.8 kg    Exam: Awake Alert, Oriented *3, No new F.N deficits, Normal affect [place].AT,PERRAL Supple Neck,No JVD, No cervical lymphadenopathy appriciated.  Symmetrical Chest wall movement, Good air movement bilaterally, CTAB RRR,No Gallops,Rubs or new  Murmurs, No Parasternal Heave +ve B.Sounds, Abd Soft, Non tender, No organomegaly appriciated, No rebound -guarding or rigidity. No Cyanosis, Clubbing or edema, No new Rash or bruise   PERTINENT RADIOLOGIC STUDIES: ECHOCARDIOGRAM LIMITED Result Date: 07/30/2023    ECHOCARDIOGRAM LIMITED REPORT   Patient Name:   [name] [name] Date of Exam: 07/30/2023 Medical  Rec #:  [id number]         Height:       62.0 in Accession #:    [id number]        Weight:       122.4 lb Date of Birth:  05-02-1951         BSA:          1.551 m Patient Age:    72 years          BP:           96/47 mmHg Patient Gender: F                 HR:           70 bpm. Exam Location:  Inpatient Procedure: Limited Echo, Cardiac Doppler and Color Doppler Indications:    Endocarditis  History:        Patient has prior history of Echocardiogram examinations, most                 recent 07/12/2023. Risk Factors:Hypertension and Dyslipidemia.                 Aortic Valve: Edwards valve is present in the aortic position.  Sonographer:    Carmelita Hartshorn RDCS, FE, PE Referring Phys: [id number] TORIBIO JAYSON SMITH IMPRESSIONS  1. Left ventricular ejection fraction, by estimation, is 70 to 75%. The left ventricle has hyperdynamic function. There is the interventricular septum is flattened in systole and diastole, consistent with right ventricular pressure and volume overload.  2. Right ventricular systolic function is mildly reduced. The right ventricular size is mildly enlarged.  3. The mitral valve is degenerative. Mild to moderate mitral valve regurgitation. Severe mitral annular calcification.  4. Tricuspid valve regurgitation is moderate.  5. Bioprosthetic aortic valve. No doppler assessment performed. The aortic valve has been repaired/replaced. Aortic valve regurgitation is not visualized. There is a Edwards valve present in the aortic position. Conclusion(s)/Recommendation(s): No evidence of valvular vegetations on this transthoracic echocardiogram. Consider a transesophageal echocardiogram to exclude infective endocarditis if clinically indicated. FINDINGS  Left Ventricle: Left ventricular ejection fraction, by estimation, is 70 to 75%. The left ventricle has hyperdynamic function. The interventricular septum is flattened in systole and diastole, consistent with right ventricular pressure and volume overload. Right  Ventricle: The right ventricular size is mildly enlarged. No increase in right ventricular wall thickness. Right ventricular systolic function is mildly reduced. Pericardium: There is no evidence of pericardial effusion. Mitral Valve: The mitral valve is degenerative in appearance. Severe mitral annular calcification. Mild to moderate mitral valve regurgitation. MV peak gradient, 8.3 mmHg. Tricuspid Valve: The tricuspid valve is grossly normal. Tricuspid valve regurgitation is moderate . No evidence of tricuspid stenosis. Aortic Valve: Bioprosthetic aortic valve. No doppler assessment performed. The aortic valve has been repaired/replaced. Aortic valve regurgitation is not visualized. There is a Edwards valve present in the aortic position. Pulmonic Valve: The pulmonic valve was grossly normal. Pulmonic valve regurgitation is mild. No evidence of pulmonic stenosis. Venous: The inferior vena cava was not well visualized. Additional Comments: A device lead is visualized in the right atrium and right ventricle.  LEFT VENTRICLE PLAX 2D LVIDd:         3.30 cm LVIDs:         1.90 cm LV PW:         1.00 cm LV IVS:        0.90 cm  MITRAL VALVE MV Peak grad: 8.3 mmHg MV Vmax:      1.44 m/s MV Vmean:     80.8 cm/s Darryle Decent MD Electronically signed by Darryle Decent MD Signature Date/Time: 07/30/2023/5:25:40 PM    Final      PERTINENT LAB RESULTS: CBC: Recent Labs    07/31/23 0454 08/01/23 0829  WBC 10.0 10.9*  HGB 11.3* 10.6*  HCT 33.2* 30.9*  PLT 128* 148*   CMET CMP     Component Value Date/Time   NA 129 (L) 08/01/2023 0459   NA 142 04/16/2017 1031   NA 142 12/13/2016 1427   K 3.8 08/01/2023 0459   K 4.4 12/13/2016 1427   CL 93 (L) 08/01/2023 0459   CO2 24 08/01/2023 0459   CO2 28 12/13/2016 1427   GLUCOSE 185 (H) 08/01/2023 0459   GLUCOSE 107 12/13/2016 1427   BUN 37 (H) 08/01/2023 0459   BUN 30 (H) 04/16/2017 1031   BUN 33.3 (H) 12/13/2016 1427   CREATININE 3.93 (H) 08/01/2023 0459    CREATININE 1.63 (H) 11/07/2017 1406   CREATININE 1.3 (H) 12/13/2016 1427   CALCIUM  9.6 08/01/2023 0459   CALCIUM  10.5 (H) 12/31/2018 1306   CALCIUM  10.3 12/13/2016 1427   PROT 6.9 07/27/2023 0518   PROT 7.9 12/13/2016 1427   ALBUMIN  3.5 08/01/2023 0459   ALBUMIN  4.4 12/13/2016 1427   AST 30 07/27/2023 0518   AST 54 (H) 12/13/2016 1427   ALT 12 07/27/2023 0518   ALT 62 (H) 12/13/2016 1427   ALKPHOS 180 (H) 07/27/2023 0518   ALKPHOS 207 (H) 12/13/2016 1427   BILITOT 1.1 07/27/2023 0518   BILITOT 0.74 12/13/2016 1427   GFR 70.12 04/13/2015 1348   EGFR 19.0 01/04/2023 0747   GFRNONAA 12 (L) 08/01/2023 0459   GFRNONAA 33 (L) 11/07/2017 1406    GFR Estimated Creatinine Clearance: 9.6 mL/min (A) (by C-G formula based on SCr of 3.93 mg/dL (H)). No results for input(s): LIPASE, AMYLASE in the last 72 hours. No results for input(s): CKTOTAL, CKMB, CKMBINDEX, TROPONINI in the last 72 hours. Invalid input(s): POCBNP No results for input(s): DDIMER in the last 72 hours. No results for input(s): HGBA1C in the last 72 hours. No results for input(s): CHOL, HDL, LDLCALC, TRIG, CHOLHDL, LDLDIRECT in the last 72 hours. No results for input(s): TSH, T4TOTAL, T3FREE, THYROIDAB in the last 72 hours.  Invalid input(s): FREET3 No results for input(s): VITAMINB12, FOLATE, FERRITIN, TIBC, IRON , RETICCTPCT in the last 72 hours. Coags: No results for input(s): INR in the last 72 hours.  Invalid input(s): PT Microbiology: Recent Results (from the past 240 hours)  Resp panel by RT-PCR (RSV, Flu A&B, Covid) Anterior Nasal Swab     Status: Abnormal   Collection Time: 07/24/23  9:55 PM   Specimen: Anterior Nasal Swab  Result Value Ref Range Status   SARS Coronavirus 2 by RT PCR NEGATIVE NEGATIVE Final    Comment: (NOTE) SARS-CoV-2 target nucleic acids are NOT DETECTED.  The SARS-CoV-2 RNA is generally detectable in upper respiratory specimens  during the acute phase of infection. The lowest concentration of SARS-CoV-2 viral copies this assay can detect is 138 copies/mL. A negative result does not preclude SARS-Cov-2  infection and should not be used as the sole basis for treatment or other patient management decisions. A negative result may occur with  improper specimen collection/handling, submission of specimen other than nasopharyngeal swab, presence of viral mutation(s) within the areas targeted by this assay, and inadequate number of viral copies(<138 copies/mL). A negative result must be combined with clinical observations, patient history, and epidemiological information. The expected result is Negative.  Fact Sheet for Patients:  bloggercourse.com  Fact Sheet for Healthcare Providers:  seriousbroker.it  This test is no t yet approved or cleared by the [place]  FDA and  has been authorized for detection and/or diagnosis of SARS-CoV-2 by FDA under an Emergency Use Authorization (EUA). This EUA will remain  in effect (meaning this test can be used) for the duration of the COVID-19 declaration under Section 564(b)(1) of the Act, 21 U.S.C.section 360bbb-3(b)(1), unless the authorization is terminated  or revoked sooner.       Influenza A by PCR POSITIVE (A) NEGATIVE Final   Influenza B by PCR NEGATIVE NEGATIVE Final    Comment: (NOTE) The Xpert Xpress SARS-CoV-2/FLU/RSV plus assay is intended as an aid in the diagnosis of influenza from Nasopharyngeal swab specimens and should not be used as a sole basis for treatment. Nasal washings and aspirates are unacceptable for Xpert Xpress SARS-CoV-2/FLU/RSV testing.  Fact Sheet for Patients: bloggercourse.com  Fact Sheet for Healthcare Providers: seriousbroker.it  This test is not yet approved or cleared by the [place]  FDA and has been authorized for detection  and/or diagnosis of SARS-CoV-2 by FDA under an Emergency Use Authorization (EUA). This EUA will remain in effect (meaning this test can be used) for the duration of the COVID-19 declaration under Section 564(b)(1) of the Act, 21 U.S.C. section 360bbb-3(b)(1), unless the authorization is terminated or revoked.     Resp Syncytial Virus by PCR NEGATIVE NEGATIVE Final    Comment: (NOTE) Fact Sheet for Patients: bloggercourse.com  Fact Sheet for Healthcare Providers: seriousbroker.it  This test is not yet approved or cleared by the [place]  FDA and has been authorized for detection and/or diagnosis of SARS-CoV-2 by FDA under an Emergency Use Authorization (EUA). This EUA will remain in effect (meaning this test can be used) for the duration of the COVID-19 declaration under Section 564(b)(1) of the Act, 21 U.S.C. section 360bbb-3(b)(1), unless the authorization is terminated or revoked.  Performed at [facility], [address] [place], [place] [address]   Blood Culture (routine x 2)     Status: None   Collection Time: 07/25/23 12:36 AM   Specimen: BLOOD  Result Value Ref Range Status   Specimen Description BLOOD BLOOD LEFT ARM  Final   Special Requests   Final    BOTTLES DRAWN AEROBIC AND ANAEROBIC Blood Culture adequate volume   Culture   Final    NO GROWTH 5 DAYS Performed at [facility], [address] [place], [place] [address]    Report Status 07/30/2023 FINAL  Final  Blood Culture (routine x 2)     Status: None   Collection Time: 07/25/23 12:36 AM   Specimen: BLOOD  Result Value Ref Range Status   Specimen Description BLOOD BLOOD LEFT HAND  Final   Special Requests   Final    BOTTLES DRAWN AEROBIC AND ANAEROBIC Blood Culture adequate volume   Culture   Final    NO GROWTH 5 DAYS Performed at [facility], [address], [place], [place] [address]    Report Status 07/30/2023 FINAL  Final  Culture, body fluid w  Gram Stain-bottle     Status: None   Collection Time: 07/27/23 12:12 PM   Specimen: Pleura  Result Value Ref Range Status   Specimen Description PLEURAL  Final   Special Requests NONE  Final   Culture   Final    NO GROWTH 5 DAYS Performed at [facility], [address], [place], [place] [address]    Report Status 08/01/2023 FINAL  Final  Gram stain     Status: None   Collection Time: 07/27/23 12:12 PM   Specimen: Pleura  Result Value Ref Range Status   Specimen Description PLEURAL  Final   Special Requests NONE  Final   Gram Stain   Final    NO WBC SEEN NO ORGANISMS SEEN Performed at [facility], [address] [place], [place] [address]    Report Status 07/27/2023 FINAL  Final    FURTHER DISCHARGE INSTRUCTIONS:  Get Medicines reviewed and adjusted: Please take all your medications with you for your next visit with your Primary MD  Laboratory/radiological data: Please request your Primary MD to go over all hospital tests and procedure/radiological results at the follow up, please ask your Primary MD to get all Hospital records sent to his/her office.  In some cases, they will be blood work, cultures and biopsy results pending at the time of your discharge. Please request that your primary care M.D. goes through all the records of your hospital data and follows up on these results.  Also Note the following: If you experience worsening of your admission symptoms, develop shortness of breath, life threatening emergency, suicidal or homicidal thoughts you must seek medical attention immediately by calling 911 or calling your MD immediately  if symptoms less severe.  You must read complete instructions/literature along with all the possible adverse reactions/side effects for all the Medicines you take and that have been prescribed to you. Take any new Medicines after you have completely understood and accpet all the possible adverse reactions/side effects.   Do not  drive when taking Pain medications or sleeping medications (Benzodaizepines)  Do not take more than prescribed Pain, Sleep and Anxiety Medications. It is not advisable to combine anxiety,sleep and pain medications without talking with your primary care practitioner  Special Instructions: If you have smoked or chewed Tobacco  in the last 2 yrs please stop smoking, stop any regular Alcohol  and or any Recreational drug use.  Wear Seat belts while driving.  Please note: You were cared for by a hospitalist during your hospital stay. Once you are discharged, your primary care physician will handle any further medical issues. Please note that NO REFILLS for any discharge medications will be authorized once you are discharged, as it is imperative that you return to your primary care physician (or establish a relationship with a primary care physician if you do not have one) for your post hospital discharge needs so that they can reassess your need for medications and monitor your lab values.  Total Time spent coordinating discharge including counseling, education and face to face time equals greater than 30 minutes.  SignedBETHA Donalda Applebaum 08/01/2023 10:43 AM

## 2023-08-01 NOTE — Progress Notes (Signed)
 Explained discharge instructions to patient. Reviewed follow up appointment and next medication administration times. Also reviewed education. Patient verbalized having an understanding for instructions given. All belongings are in the patient's possession to include TOC meds. Waiting for needles for her insulin  pen. IV and telemetry were removed. CCMD was notified. No other needs verbalized. Will transport downstairs for discharge when pens arrive.

## 2023-08-01 NOTE — Discharge Planning (Signed)
 [facility] Patient Discharge Orders - [facility] CLINIC: [place] - TCU  Patient's name: [name] [name] Admit/DC Dates: 07/24/2023 - 08/01/23  DISCHARGE DIAGNOSES: AMS - better with treatment of #2 Flu A + pneumonia (related to Flu v. 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 Claudene as outpatient Episode hematochezia 1/7 -> resolved without intervention  HD ORDER CHANGES: Heparin  change: no EDW Change: YES New EDW: 46kg Bath Change: YES -> change to 3K and check weekly K levels  ANEMIA MANAGEMENT: Aranesp: Given: no    ESA dose for discharge: restart per protocol IV Iron  dose at discharge: per protocol Transfusion: Given: no  BONE/MINERAL MEDICATIONS: Hectorol/Calcitriol change: no Sensipar/Parsabiv change: no  ACCESS INTERVENTION/CHANGE: no Details:   RECENT LABS: Recent Labs  Lab 08/01/23 0459 08/01/23 0829  HGB  --  10.6*  NA 129*  --   K 3.8  --   CALCIUM  9.6  --   PHOS 3.9  --   ALBUMIN  3.5  --     IV ANTIBIOTICS: no Details:  OTHER ANTICOAGULATION: On Coumadin?: no  OTHER/APPTS/LAB ORDERS:  - Weak during admit, plan is for home PT/OT. Husband reports they have all needed equipment at home and he is building a wheelchair ramp.  D/C Meds to be reconciled by nurse after every discharge.  Completed By: Izetta Boehringer, PA-C [facility] Pager [phone]   Reviewed by: MD:______ RN_______

## 2023-08-01 NOTE — Plan of Care (Signed)
   Problem: Education: Goal: Ability to describe self-care measures that may prevent or decrease complications (Diabetes Survival Skills Education) will improve Outcome: Progressing Goal: Individualized Educational Video(s) Outcome: Progressing   Problem: Coping: Goal: Ability to adjust to condition or change in health will improve Outcome: Progressing   Problem: Fluid Volume: Goal: Ability to maintain a balanced intake and output will improve Outcome: Progressing   Problem: Health Behavior/Discharge Planning: Goal: Ability to identify and utilize available resources and services will improve Outcome: Progressing Goal: Ability to manage health-related needs will improve Outcome: Progressing   Problem: Metabolic: Goal: Ability to maintain appropriate glucose levels will improve Outcome: Progressing   Problem: Nutritional: Goal: Maintenance of adequate nutrition will improve Outcome: Progressing Goal: Progress toward achieving an optimal weight will improve Outcome: Progressing   Problem: Skin Integrity: Goal: Risk for impaired skin integrity will decrease Outcome: Progressing   Problem: Tissue Perfusion: Goal: Adequacy of tissue perfusion will improve Outcome: Progressing   Problem: Education: Goal: Knowledge of General Education information will improve Description: Including pain rating scale, medication(s)/side effects and non-pharmacologic comfort measures Outcome: Progressing   Problem: Clinical Measurements: Goal: Ability to maintain clinical measurements within normal limits will improve Outcome: Progressing Goal: Will remain free from infection Outcome: Progressing Goal: Diagnostic test results will improve Outcome: Progressing Goal: Respiratory complications will improve Outcome: Progressing Goal: Cardiovascular complication will be avoided Outcome: Progressing

## 2023-08-01 NOTE — Progress Notes (Signed)
 Physical Therapy Treatment Patient Details Name: [name] [name] MRN: [id number] DOB: 1950/09/27 Today's Date: 08/01/2023   History of Present Illness Patient is a 73 yo female presenting to [facility] on 07/24/23 with weakness and cough, transferred to [facility] on 07/26/23 found to have encephalopathy in the setting of influenza infection. PMH includes: aortic stenosis, CHF, chronic kidney disease, diabetes, CAD    PT Comments  Received pt sitting in recliner with husband at bedside. Pt agreeable to go for a walk and to try her best this morning. Performed transfers with RW and light min A from low sitting recliner and ambulated into hallway with RW and CGA - noted decreased cadence, narrow BOS, flexed trunk/downward gaze but pt able to self-correct to stand tall and look up. SPO2 >95% on RA with ambulation but pt limited by fatigue and tightness in R lateral thigh - provided pt with heat pack for relief. Pt will have support from her husband at home and plan to D/C home today. Continue to recommend HHPT to maximize strength and endurance.     If plan is discharge home, recommend the following: A little help with walking and/or transfers;A little help with bathing/dressing/bathroom;Assistance with cooking/housework;Assist for transportation;Help with stairs or ramp for entrance;Supervision due to cognitive status   Can travel by private vehicle        Equipment Recommendations  None recommended by PT    Recommendations for Other Services       Precautions / Restrictions Precautions Precautions: Fall Precaution Comments: monitor O2 Restrictions Weight Bearing Restrictions Per Provider Order: No     Mobility  Bed Mobility               General bed mobility comments: in recliner at start and end of session Patient Response: Cooperative  Transfers Overall transfer level: Needs assistance Equipment used: Rolling walker (2 wheels) Transfers: Sit to/from Stand Sit to Stand: Min assist            General transfer comment: light min A to stand from low sitting recliner    Ambulation/Gait Ambulation/Gait assistance: Contact guard assist Gait Distance (Feet): 62 Feet Assistive device: Rolling walker (2 wheels) Gait Pattern/deviations: Step-through pattern, Decreased stride length, Drifts right/left, Trunk flexed, Decreased step length - right, Decreased step length - left, Narrow base of support Gait velocity: decreased Gait velocity interpretation: <1.31 ft/sec, indicative of household ambulator   General Gait Details: limited by fatigue and discomfort in R lateral thigh   Stairs             Wheelchair Mobility     Tilt Bed Tilt Bed Patient Response: Cooperative  Modified Rankin (Stroke Patients Only)       Balance Overall balance assessment: Needs assistance Sitting-balance support: No upper extremity supported, Feet supported Sitting balance-Leahy Scale: Good Sitting balance - Comments: sitting at edge of recliner   Standing balance support: Single extremity supported, During functional activity, Reliant on assistive device for balance (RW) Standing balance-Leahy Scale: Poor Standing balance comment: heavy reliance on BUE support                            Cognition Arousal: Alert Behavior During Therapy: WFL for tasks assessed/performed Overall Cognitive Status: Within Functional Limits for tasks assessed                         Following Commands: Follows one step commands consistently, Follows one step commands  with increased time       General Comments: husband at bedside        Exercises      General Comments General comments (skin integrity, edema, etc.): SPO2 >95% on RA with ambulation      Pertinent Vitals/Pain Pain Assessment Pain Assessment: Faces Faces Pain Scale: Hurts a little bit Pain Location: R lateral thigh Pain Descriptors / Indicators: Tightness, Discomfort, Grimacing Pain  Intervention(s): Limited activity within patient's tolerance, Monitored during session, Repositioned, Heat applied    Home Living                          Prior Function            PT Goals (current goals can now be found in the care plan section) Acute Rehab PT Goals Patient Stated Goal: get well PT Goal Formulation: With patient/family Time For Goal Achievement: 08/10/23 Potential to Achieve Goals: Good Progress towards PT goals: Progressing toward goals    Frequency    Min 1X/week      PT Plan      Co-evaluation              AM-PAC PT 6 Clicks Mobility   Outcome Measure  Help needed turning from your back to your side while in a flat bed without using bedrails?: None Help needed moving from lying on your back to sitting on the side of a flat bed without using bedrails?: A Little Help needed moving to and from a bed to a chair (including a wheelchair)?: A Little Help needed standing up from a chair using your arms (e.g., wheelchair or bedside chair)?: A Little Help needed to walk in hospital room?: A Little Help needed climbing 3-5 steps with a railing? : A Little 6 Click Score: 19    End of Session Equipment Utilized During Treatment: Gait belt Activity Tolerance: Patient limited by fatigue Patient left: in chair;with call bell/phone within reach;with family/visitor present Nurse Communication: Mobility status PT Visit Diagnosis: Unsteadiness on feet (R26.81);Other abnormalities of gait and mobility (R26.89);Muscle weakness (generalized) (M62.81);Other symptoms and signs involving the nervous system (R29.898)     Time: 9048-8987 PT Time Calculation (min) (ACUTE ONLY): 21 min  Charges:    $Gait Training: 8-22 mins PT General Charges $$ ACUTE PT VISIT: 1 Visit                     Therisa Stains PT, DPT Therisa HERO Zaunegger 08/01/2023, 11:33 AM

## 2023-08-01 NOTE — Progress Notes (Signed)
 [facility] Progress Note   Subjective:   Patient seen in her room this morning. She was alert and eating her breakfast. Her husband was in the room with her. Continued improvement with her breathing and planned discharge today. Denies dyspnea, SOB, and CP. HD discharge order placed by Izetta Boehringer, PA.   Objective Vitals:   07/31/23 2000 08/01/23 0010 08/01/23 0800 08/01/23 0831  BP: (!) 107/42 (!) 100/59  (!) 110/55  Pulse: 84   95  Resp: 16  20   Temp: 98.1 F (36.7 C) 98 F (36.7 C)    TempSrc: Oral Oral Oral   SpO2: 93%     Weight:      Height:       Physical Exam General: Debilitated and frail pleasant woman, alert, no acute distress Heart: RRR, Murmur 4/6 Lungs: CTA anteriorly. No rales, wheeze, or rhonichi. Room air Abdomen: Soft, non-tender, non-distended Extremities: No UE or LE edema Dialysis Access: RIJ TDC and RUE AVF +b/t (currently maturing)  Additional Objective Labs: Basic Metabolic Panel: Recent Labs  Lab 07/30/23 0433 07/31/23 0454 08/01/23 0459  NA 130* 126* 129*  K 3.7 3.8 3.8  CL 92* 91* 93*  CO2 23 22 24   GLUCOSE 169* 174* 185*  BUN 28* 52* 37*  CREATININE 2.11* 3.52* 3.93*  CALCIUM  9.5 9.2 9.6  PHOS 2.9 4.0 3.9   Liver Function Tests: Recent Labs  Lab 07/27/23 0518 07/28/23 0548 07/30/23 0433 07/31/23 0454 08/01/23 0459  AST 30  --   --   --   --   ALT 12  --   --   --   --   ALKPHOS 180*  --   --   --   --   BILITOT 1.1  --   --   --   --   PROT 6.9  --   --   --   --   ALBUMIN  3.4*   < > 3.4* 3.0* 3.5   < > = values in this interval not displayed.    CBC: Recent Labs  Lab 07/29/23 0444 07/30/23 0433 07/30/23 1530 07/31/23 0454 08/01/23 0829  WBC 5.9 10.3 7.8 10.0 10.9*  NEUTROABS 4.1 7.9*  --  7.1  --   HGB 11.3* 12.4 11.4* 11.3* 10.6*  HCT 33.3* 36.5 33.7* 33.2* 30.9*  MCV 91.7 91.3 91.6 91.2 90.1  PLT 99* 130* 120* 128* 148*    CBG: Recent Labs  Lab 07/31/23 0734 07/31/23 1239  07/31/23 1649 07/31/23 2156 08/01/23 0827  GLUCAP 197* 152* 197* 216* 167*   Medications:   (feeding supplement) PROSource Plus  30 mL Oral BID BM   apixaban   5 mg Oral BID   benzonatate   200 mg Oral TID   Chlorhexidine  Gluconate Cloth  6 each Topical Q0600   dextromethorphan   30 mg Oral QHS   escitalopram   5 mg Oral QHS   famotidine   10 mg Oral QHS   fluticasone   2 spray Each Nare Daily   gabapentin   100 mg Oral Daily   insulin  glargine-yfgn  6 Units Subcutaneous Daily   insulin  glulisine  0-6 Units Subcutaneous TID with meals   lactulose   20 g Oral BID   metoprolol  succinate  12.5 mg Oral Daily   midodrine   5 mg Oral Once per day on Sunday Tuesday Thursday Saturday   pantoprazole  (PROTONIX ) IV  40 mg Intravenous Q12H   sodium chloride  flush  3 mL Intravenous Q12H    Dialysis Orders Home  HD (training) MTTF, EDW 46kg, 3hr, 3K, currently on IV Fe  Assessment/Plan: Infulenza A + PNA: completed course of IV Levaquin . Symptoms improving. Cough and dyspnea much better.  ESRD: dc orders sent to her facility. HD scheduled 1/10. Continue on normal HD schedule.  HTN/volume: Midodrine  pre-dialysis for hypotension. No UE or LE edema today. EDW changed to 46kg.  Anemia of ESRD: Hgb 11.3. ESA on hold for now. Continue IV Fe per protocol.  Secondary HPTH: Corrected Ca and Phos good today. Not currently on binder.  Nutrition: Albumin  low. Continue supplements CAD/Hx CABG/ HFrEF T1DM: continue insulin  Dispo: d/c home today. Home PT and OT ordered   Belvie Och, AGNP 08/01/2023, 11:27 AM  [employer]

## 2023-08-01 NOTE — Progress Notes (Signed)
 Mobility Specialist Progress Note;   08/01/23 0925  Mobility  Activity Transferred from bed to chair  Level of Assistance Minimal assist, patient does 75% or more  Assistive Device Front wheel walker  Distance Ambulated (ft) 10 ft  Activity Response Tolerated well  Mobility Referral Yes  Mobility visit 1 Mobility  Mobility Specialist Start Time (ACUTE ONLY) [id number]  Mobility Specialist Stop Time (ACUTE ONLY) [id number]  Mobility Specialist Time Calculation (min) (ACUTE ONLY) 12 min   Pt agreeable to mobility. Required MinA for STS and for ambulation around bed to recliner. VSS on RA. Pt still seeming a bit fatigued, taking small slow steps while ambulating. No c/o during session. Pt left in chair with all needs met, alarm on. Husband in room.   Lauraine Erm Mobility Specialist Please contact via SecureChat or [employer] [phone]

## 2023-08-01 NOTE — Progress Notes (Signed)
 Noted pt had been d/c to home today. Contacted [facility] home therapy RN to be advised of pt's d/c today and that pt should resume care tomorrow.   Olivia Canter Renal Navigator [phone]

## 2023-08-01 NOTE — TOC Transition Note (Signed)
 Transition of Care [facility]) - Discharge Note   Patient Details  Name: [name] [name] MRN: [id number] Date of Birth: 1951-02-09  Transition of Care [employer]) CM/SW Contact:  Hendricks KANDICE Her, RN Phone Number: 08/01/2023, 11:28 AM   Clinical Narrative:    Patient will dc to home today with Husband. Home health PT and OT have been recommended and will be provided by [facility]. AVS updated. No DME needed Husband states they have everything they need in the home. Husband will transport home today  he also provided transportation to HD 4 days per week . No additional TOC needs           Patient Goals and CMS Choice            Discharge Placement                       Discharge Plan and Services Additional resources added to the After Visit Summary for                                       Social Drivers of Health (SDOH) Interventions SDOH Screenings   Food Insecurity: No Food Insecurity (07/26/2023)  Housing: Low Risk  (07/26/2023)  Transportation Needs: No Transportation Needs (07/26/2023)  Utilities: Not At Risk (07/26/2023)  Depression (PHQ2-9): Low Risk  (09/08/2018)  Financial Resource Strain: Low Risk  (01/02/2023)   Received from [facility], [facility]  Physical Activity: Insufficiently Active (03/17/2021)   Received from [facility]  Social Connections: Socially Isolated (07/26/2023)  Stress: Stress Concern Present (03/17/2021)   Received from [facility], [facility]  Tobacco Use: Low Risk  (07/29/2023)     Readmission Risk Interventions     No data to display

## 2023-08-04 ENCOUNTER — Encounter (HOSPITAL_COMMUNITY): Payer: Self-pay

## 2023-08-04 ENCOUNTER — Emergency Department (HOSPITAL_COMMUNITY): Payer: Medicare PPO

## 2023-08-04 ENCOUNTER — Other Ambulatory Visit: Payer: Self-pay

## 2023-08-04 ENCOUNTER — Inpatient Hospital Stay (HOSPITAL_COMMUNITY)
Admission: EM | Admit: 2023-08-04 | Discharge: 2023-08-09 | DRG: 070 | Disposition: A | Discharge status: Home Health | Payer: Medicare PPO | Attending: Internal Medicine | Admitting: Internal Medicine

## 2023-08-04 DIAGNOSIS — J90 Pleural effusion, not elsewhere classified: Secondary | ICD-10-CM | POA: Diagnosis not present

## 2023-08-04 DIAGNOSIS — Z833 Family history of diabetes mellitus: Secondary | ICD-10-CM

## 2023-08-04 DIAGNOSIS — Z803 Family history of malignant neoplasm of breast: Secondary | ICD-10-CM

## 2023-08-04 DIAGNOSIS — X58XXXA Exposure to other specified factors, initial encounter: Secondary | ICD-10-CM | POA: Diagnosis present

## 2023-08-04 DIAGNOSIS — I953 Hypotension of hemodialysis: Secondary | ICD-10-CM | POA: Diagnosis present

## 2023-08-04 DIAGNOSIS — I10 Essential (primary) hypertension: Secondary | ICD-10-CM | POA: Diagnosis present

## 2023-08-04 DIAGNOSIS — K7469 Other cirrhosis of liver: Secondary | ICD-10-CM | POA: Diagnosis present

## 2023-08-04 DIAGNOSIS — Z79899 Other long term (current) drug therapy: Secondary | ICD-10-CM

## 2023-08-04 DIAGNOSIS — R1310 Dysphagia, unspecified: Secondary | ICD-10-CM | POA: Diagnosis present

## 2023-08-04 DIAGNOSIS — I350 Nonrheumatic aortic (valve) stenosis: Secondary | ICD-10-CM | POA: Diagnosis present

## 2023-08-04 DIAGNOSIS — Z1152 Encounter for screening for COVID-19: Secondary | ICD-10-CM | POA: Diagnosis not present

## 2023-08-04 DIAGNOSIS — Z950 Presence of cardiac pacemaker: Secondary | ICD-10-CM | POA: Diagnosis present

## 2023-08-04 DIAGNOSIS — R911 Solitary pulmonary nodule: Secondary | ICD-10-CM | POA: Diagnosis not present

## 2023-08-04 DIAGNOSIS — Z992 Dependence on renal dialysis: Secondary | ICD-10-CM | POA: Diagnosis not present

## 2023-08-04 DIAGNOSIS — D631 Anemia in chronic kidney disease: Secondary | ICD-10-CM | POA: Diagnosis not present

## 2023-08-04 DIAGNOSIS — R531 Weakness: Secondary | ICD-10-CM | POA: Diagnosis not present

## 2023-08-04 DIAGNOSIS — E876 Hypokalemia: Secondary | ICD-10-CM | POA: Diagnosis present

## 2023-08-04 DIAGNOSIS — Z905 Acquired absence of kidney: Secondary | ICD-10-CM

## 2023-08-04 DIAGNOSIS — J690 Pneumonitis due to inhalation of food and vomit: Secondary | ICD-10-CM | POA: Diagnosis not present

## 2023-08-04 DIAGNOSIS — Z889 Allergy status to unspecified drugs, medicaments and biological substances status: Secondary | ICD-10-CM

## 2023-08-04 DIAGNOSIS — F419 Anxiety disorder, unspecified: Secondary | ICD-10-CM | POA: Diagnosis present

## 2023-08-04 DIAGNOSIS — R0689 Other abnormalities of breathing: Secondary | ICD-10-CM | POA: Diagnosis present

## 2023-08-04 DIAGNOSIS — E1059 Type 1 diabetes mellitus with other circulatory complications: Secondary | ICD-10-CM | POA: Diagnosis not present

## 2023-08-04 DIAGNOSIS — M810 Age-related osteoporosis without current pathological fracture: Secondary | ICD-10-CM | POA: Diagnosis present

## 2023-08-04 DIAGNOSIS — Z8719 Personal history of other diseases of the digestive system: Secondary | ICD-10-CM | POA: Diagnosis not present

## 2023-08-04 DIAGNOSIS — J1000 Influenza due to other identified influenza virus with unspecified type of pneumonia: Secondary | ICD-10-CM | POA: Diagnosis not present

## 2023-08-04 DIAGNOSIS — Z953 Presence of xenogenic heart valve: Secondary | ICD-10-CM

## 2023-08-04 DIAGNOSIS — D649 Anemia, unspecified: Secondary | ICD-10-CM | POA: Diagnosis not present

## 2023-08-04 DIAGNOSIS — D62 Acute posthemorrhagic anemia: Secondary | ICD-10-CM | POA: Diagnosis not present

## 2023-08-04 DIAGNOSIS — E871 Hypo-osmolality and hyponatremia: Secondary | ICD-10-CM | POA: Diagnosis present

## 2023-08-04 DIAGNOSIS — Z955 Presence of coronary angioplasty implant and graft: Secondary | ICD-10-CM

## 2023-08-04 DIAGNOSIS — K921 Melena: Secondary | ICD-10-CM | POA: Diagnosis not present

## 2023-08-04 DIAGNOSIS — J9811 Atelectasis: Secondary | ICD-10-CM | POA: Diagnosis not present

## 2023-08-04 DIAGNOSIS — Z794 Long term (current) use of insulin: Secondary | ICD-10-CM

## 2023-08-04 DIAGNOSIS — N186 End stage renal disease: Secondary | ICD-10-CM

## 2023-08-04 DIAGNOSIS — K7460 Unspecified cirrhosis of liver: Secondary | ICD-10-CM | POA: Diagnosis present

## 2023-08-04 DIAGNOSIS — Z853 Personal history of malignant neoplasm of breast: Secondary | ICD-10-CM

## 2023-08-04 DIAGNOSIS — R000 Tachycardia, unspecified: Secondary | ICD-10-CM | POA: Diagnosis not present

## 2023-08-04 DIAGNOSIS — K317 Polyp of stomach and duodenum: Secondary | ICD-10-CM | POA: Diagnosis present

## 2023-08-04 DIAGNOSIS — K264 Chronic or unspecified duodenal ulcer with hemorrhage: Secondary | ICD-10-CM | POA: Diagnosis not present

## 2023-08-04 DIAGNOSIS — R627 Adult failure to thrive: Principal | ICD-10-CM

## 2023-08-04 DIAGNOSIS — I132 Hypertensive heart and chronic kidney disease with heart failure and with stage 5 chronic kidney disease, or end stage renal disease: Secondary | ICD-10-CM | POA: Diagnosis present

## 2023-08-04 DIAGNOSIS — G4733 Obstructive sleep apnea (adult) (pediatric): Secondary | ICD-10-CM | POA: Diagnosis present

## 2023-08-04 DIAGNOSIS — E785 Hyperlipidemia, unspecified: Secondary | ICD-10-CM | POA: Diagnosis present

## 2023-08-04 DIAGNOSIS — K7581 Nonalcoholic steatohepatitis (NASH): Secondary | ICD-10-CM | POA: Diagnosis not present

## 2023-08-04 DIAGNOSIS — Z8571 Personal history of Hodgkin lymphoma: Secondary | ICD-10-CM

## 2023-08-04 DIAGNOSIS — E440 Moderate protein-calorie malnutrition: Secondary | ICD-10-CM | POA: Diagnosis not present

## 2023-08-04 DIAGNOSIS — Z9641 Presence of insulin pump (external) (internal): Secondary | ICD-10-CM | POA: Diagnosis present

## 2023-08-04 DIAGNOSIS — Z681 Body mass index (BMI) 19 or less, adult: Secondary | ICD-10-CM

## 2023-08-04 DIAGNOSIS — E8779 Other fluid overload: Secondary | ICD-10-CM | POA: Diagnosis present

## 2023-08-04 DIAGNOSIS — S0012XA Contusion of left eyelid and periocular area, initial encounter: Secondary | ICD-10-CM | POA: Diagnosis present

## 2023-08-04 DIAGNOSIS — R5383 Other fatigue: Secondary | ICD-10-CM | POA: Diagnosis not present

## 2023-08-04 DIAGNOSIS — J40 Bronchitis, not specified as acute or chronic: Secondary | ICD-10-CM | POA: Diagnosis present

## 2023-08-04 DIAGNOSIS — E109 Type 1 diabetes mellitus without complications: Secondary | ICD-10-CM | POA: Diagnosis present

## 2023-08-04 DIAGNOSIS — Z90722 Acquired absence of ovaries, bilateral: Secondary | ICD-10-CM

## 2023-08-04 DIAGNOSIS — D6832 Hemorrhagic disorder due to extrinsic circulating anticoagulants: Secondary | ICD-10-CM | POA: Diagnosis not present

## 2023-08-04 DIAGNOSIS — M7989 Other specified soft tissue disorders: Secondary | ICD-10-CM | POA: Diagnosis not present

## 2023-08-04 DIAGNOSIS — I5032 Chronic diastolic (congestive) heart failure: Secondary | ICD-10-CM | POA: Diagnosis present

## 2023-08-04 DIAGNOSIS — E1022 Type 1 diabetes mellitus with diabetic chronic kidney disease: Secondary | ICD-10-CM | POA: Diagnosis present

## 2023-08-04 DIAGNOSIS — N2581 Secondary hyperparathyroidism of renal origin: Secondary | ICD-10-CM | POA: Diagnosis not present

## 2023-08-04 DIAGNOSIS — Z91158 Patient's noncompliance with renal dialysis for other reason: Secondary | ICD-10-CM

## 2023-08-04 DIAGNOSIS — I480 Paroxysmal atrial fibrillation: Secondary | ICD-10-CM | POA: Diagnosis present

## 2023-08-04 DIAGNOSIS — I25110 Atherosclerotic heart disease of native coronary artery with unstable angina pectoris: Secondary | ICD-10-CM | POA: Diagnosis not present

## 2023-08-04 DIAGNOSIS — R112 Nausea with vomiting, unspecified: Secondary | ICD-10-CM | POA: Diagnosis not present

## 2023-08-04 DIAGNOSIS — Z8249 Family history of ischemic heart disease and other diseases of the circulatory system: Secondary | ICD-10-CM

## 2023-08-04 DIAGNOSIS — Z7901 Long term (current) use of anticoagulants: Secondary | ICD-10-CM

## 2023-08-04 DIAGNOSIS — Z83511 Family history of glaucoma: Secondary | ICD-10-CM

## 2023-08-04 DIAGNOSIS — T45515A Adverse effect of anticoagulants, initial encounter: Secondary | ICD-10-CM | POA: Diagnosis not present

## 2023-08-04 DIAGNOSIS — Z90710 Acquired absence of both cervix and uterus: Secondary | ICD-10-CM

## 2023-08-04 DIAGNOSIS — Z85528 Personal history of other malignant neoplasm of kidney: Secondary | ICD-10-CM

## 2023-08-04 DIAGNOSIS — R5381 Other malaise: Secondary | ICD-10-CM | POA: Diagnosis not present

## 2023-08-04 DIAGNOSIS — I120 Hypertensive chronic kidney disease with stage 5 chronic kidney disease or end stage renal disease: Secondary | ICD-10-CM | POA: Diagnosis not present

## 2023-08-04 DIAGNOSIS — Z91048 Other nonmedicinal substance allergy status: Secondary | ICD-10-CM

## 2023-08-04 DIAGNOSIS — I25118 Atherosclerotic heart disease of native coronary artery with other forms of angina pectoris: Secondary | ICD-10-CM | POA: Diagnosis present

## 2023-08-04 DIAGNOSIS — K269 Duodenal ulcer, unspecified as acute or chronic, without hemorrhage or perforation: Secondary | ICD-10-CM | POA: Diagnosis present

## 2023-08-04 DIAGNOSIS — K625 Hemorrhage of anus and rectum: Secondary | ICD-10-CM | POA: Diagnosis not present

## 2023-08-04 DIAGNOSIS — Z808 Family history of malignant neoplasm of other organs or systems: Secondary | ICD-10-CM

## 2023-08-04 DIAGNOSIS — Z820 Family history of epilepsy and other diseases of the nervous system: Secondary | ICD-10-CM

## 2023-08-04 DIAGNOSIS — Z801 Family history of malignant neoplasm of trachea, bronchus and lung: Secondary | ICD-10-CM

## 2023-08-04 DIAGNOSIS — Z860101 Personal history of adenomatous and serrated colon polyps: Secondary | ICD-10-CM | POA: Diagnosis not present

## 2023-08-04 DIAGNOSIS — G43909 Migraine, unspecified, not intractable, without status migrainosus: Secondary | ICD-10-CM | POA: Diagnosis present

## 2023-08-04 DIAGNOSIS — H9202 Otalgia, left ear: Secondary | ICD-10-CM | POA: Diagnosis present

## 2023-08-04 DIAGNOSIS — G9341 Metabolic encephalopathy: Principal | ICD-10-CM | POA: Diagnosis present

## 2023-08-04 DIAGNOSIS — Z951 Presence of aortocoronary bypass graft: Secondary | ICD-10-CM

## 2023-08-04 DIAGNOSIS — Z9012 Acquired absence of left breast and nipple: Secondary | ICD-10-CM

## 2023-08-04 DIAGNOSIS — Z8572 Personal history of non-Hodgkin lymphomas: Secondary | ICD-10-CM

## 2023-08-04 DIAGNOSIS — I808 Phlebitis and thrombophlebitis of other sites: Secondary | ICD-10-CM | POA: Diagnosis not present

## 2023-08-04 DIAGNOSIS — K5909 Other constipation: Secondary | ICD-10-CM | POA: Diagnosis present

## 2023-08-04 DIAGNOSIS — R918 Other nonspecific abnormal finding of lung field: Secondary | ICD-10-CM | POA: Diagnosis not present

## 2023-08-04 DIAGNOSIS — J984 Other disorders of lung: Secondary | ICD-10-CM | POA: Diagnosis present

## 2023-08-04 DIAGNOSIS — D509 Iron deficiency anemia, unspecified: Secondary | ICD-10-CM | POA: Diagnosis present

## 2023-08-04 DIAGNOSIS — I444 Left anterior fascicular block: Secondary | ICD-10-CM | POA: Diagnosis present

## 2023-08-04 LAB — COMPREHENSIVE METABOLIC PANEL
ALT: 16 U/L (ref 0–44)
AST: 24 U/L (ref 15–41)
Albumin: 3 g/dL — ABNORMAL LOW (ref 3.5–5.0)
Alkaline Phosphatase: 241 U/L — ABNORMAL HIGH (ref 38–126)
Anion gap: 11 (ref 5–15)
BUN: 85 mg/dL — ABNORMAL HIGH (ref 8–23)
CO2: 24 mmol/L (ref 22–32)
Calcium: 9.6 mg/dL (ref 8.9–10.3)
Chloride: 93 mmol/L — ABNORMAL LOW (ref 98–111)
Creatinine, Ser: 2.93 mg/dL — ABNORMAL HIGH (ref 0.44–1.00)
GFR, Estimated: 17 mL/min — ABNORMAL LOW (ref >60)
Glucose, Bld: 202 mg/dL — ABNORMAL HIGH (ref 70–99)
Potassium: 3.8 mmol/L (ref 3.5–5.1)
Sodium: 128 mmol/L — ABNORMAL LOW (ref 135–145)
Total Bilirubin: 2 mg/dL — ABNORMAL HIGH (ref 0.0–1.2)
Total Protein: 7 g/dL (ref 6.5–8.1)

## 2023-08-04 LAB — BLOOD GAS, VENOUS
Acid-Base Excess: 2.4 mmol/L — ABNORMAL HIGH (ref 0.0–2.0)
Bicarbonate: 27.2 mmol/L (ref 20.0–28.0)
Drawn by: 44828
O2 Saturation: 84 %
Patient temperature: 36.5
pCO2, Ven: 41 mm[Hg] — ABNORMAL LOW (ref 44–60)
pH, Ven: 7.43 (ref 7.25–7.43)
pO2, Ven: 50 mm[Hg] — ABNORMAL HIGH (ref 32–45)

## 2023-08-04 LAB — CBC WITH DIFFERENTIAL/PLATELET
Abs Immature Granulocytes: 0.06 10*3/uL (ref 0.00–0.07)
Basophils Absolute: 0.1 10*3/uL (ref 0.0–0.1)
Basophils Relative: 1 %
Eosinophils Absolute: 0 10*3/uL (ref 0.0–0.5)
Eosinophils Relative: 0 %
HCT: 28 % — ABNORMAL LOW (ref 36.0–46.0)
Hemoglobin: 9.5 g/dL — ABNORMAL LOW (ref 12.0–15.0)
Immature Granulocytes: 1 %
Lymphocytes Relative: 9 %
Lymphs Abs: 1 10*3/uL (ref 0.7–4.0)
MCH: 30.9 pg (ref 26.0–34.0)
MCHC: 33.9 g/dL (ref 30.0–36.0)
MCV: 91.2 fL (ref 80.0–100.0)
Monocytes Absolute: 1.1 10*3/uL — ABNORMAL HIGH (ref 0.1–1.0)
Monocytes Relative: 11 %
Neutro Abs: 8.4 10*3/uL — ABNORMAL HIGH (ref 1.7–7.7)
Neutrophils Relative %: 78 %
Platelets: 186 10*3/uL (ref 150–400)
RBC: 3.07 MIL/uL — ABNORMAL LOW (ref 3.87–5.11)
RDW: 14.3 % (ref 11.5–15.5)
WBC: 10.6 10*3/uL — ABNORMAL HIGH (ref 4.0–10.5)
nRBC: 0 % (ref 0.0–0.2)

## 2023-08-04 LAB — CBG MONITORING, ED: Glucose-Capillary: 168 mg/dL — ABNORMAL HIGH (ref 70–99)

## 2023-08-04 LAB — PROTIME-INR
INR: 4 — ABNORMAL HIGH (ref 0.8–1.2)
Prothrombin Time: 39.1 s — ABNORMAL HIGH (ref 11.4–15.2)

## 2023-08-04 LAB — LACTIC ACID, PLASMA
Lactic Acid, Venous: 0.9 mmol/L (ref 0.5–1.9)
Lactic Acid, Venous: 1 mmol/L (ref 0.5–1.9)

## 2023-08-04 LAB — APTT: aPTT: 52 s — ABNORMAL HIGH (ref 24–36)

## 2023-08-04 LAB — CK: Total CK: 18 U/L — ABNORMAL LOW (ref 38–234)

## 2023-08-04 LAB — TSH: TSH: 1.304 u[IU]/mL (ref 0.350–4.500)

## 2023-08-04 LAB — AMMONIA: Ammonia: 35 umol/L (ref 9–35)

## 2023-08-04 MED ORDER — SODIUM CHLORIDE 0.9% FLUSH
3.0000 mL | Freq: Two times a day (BID) | INTRAVENOUS | Status: DC
  Administered 2023-08-04 – 2023-08-06 (×2): 3 mL via INTRAVENOUS

## 2023-08-04 MED ORDER — SODIUM CHLORIDE 0.9% FLUSH
3.0000 mL | Freq: Two times a day (BID) | INTRAVENOUS | Status: DC
  Administered 2023-08-04 – 2023-08-09 (×8): 3 mL via INTRAVENOUS

## 2023-08-04 MED ORDER — SODIUM CHLORIDE 0.9 % IV SOLN: 250.0000 mL | INTRAVENOUS | Status: AC | PRN

## 2023-08-04 MED ORDER — SODIUM CHLORIDE 0.9% FLUSH: 3.0000 mL | INTRAVENOUS | Status: DC | PRN

## 2023-08-04 MED ORDER — ONDANSETRON HCL 4 MG/2ML IJ SOLN
4.0000 mg | Freq: Once | INTRAMUSCULAR | Status: AC
  Administered 2023-08-04: 4 mg via INTRAVENOUS
  Filled 2023-08-04: qty 2

## 2023-08-04 MED ORDER — DOXYCYCLINE HYCLATE 100 MG IV SOLR
100.0000 mg | Freq: Two times a day (BID) | INTRAVENOUS | Status: DC
  Administered 2023-08-05 – 2023-08-08 (×8): 100 mg via INTRAVENOUS
  Filled 2023-08-04 (×11): qty 100

## 2023-08-04 MED ORDER — DEXTROSE-SODIUM CHLORIDE 5-0.9 % IV SOLN: INTRAVENOUS | Status: AC

## 2023-08-04 MED ORDER — DOXYCYCLINE HYCLATE 100 MG IV SOLR
200.0000 mg | Freq: Once | INTRAVENOUS | Status: DC
  Filled 2023-08-04: qty 200

## 2023-08-04 NOTE — Assessment & Plan Note (Signed)
 IV PPI

## 2023-08-04 NOTE — ED Triage Notes (Addendum)
 Pt bib [facility]. Family states pt is failure to thrive. Pt is a dialysis pt. Pt states she is just tired. Pt states she was sleeping and woke up to EMS. Pt states she hasn't been eating well because she is just tired and needs to sleep. Family states she has not had dialysis since Wednesday. States pt was just released from [facility] on the 9th and was able to walk with a walker, over the past few days she is not weight bearing. Pt was diagnosed with the flu during her last admission.

## 2023-08-04 NOTE — Assessment & Plan Note (Signed)
 Decompensated liver cirrhosis. INR is elevated.

## 2023-08-04 NOTE — ED Notes (Signed)
 ..ED TO INPATIENT HANDOFF REPORT  ED Nurse Name and Phone #: [phone]  S Name/Age/Gender [name] [name] 73 y.o. female Room/Bed: APA18/APA18  Code Status   Code Status: Full Code  Home/SNF/Other Home Patient oriented to: self, place, time, and situation Is this baseline? Yes   Triage Complete: Triage complete  Chief Complaint Lethargy [R53.83]  Triage Note Pt bib [facility]. Family states pt is failure to thrive. Pt is a dialysis pt. Pt states she is just tired. Pt states she was sleeping and woke up to EMS. Pt states she hasn't been eating well because she is just tired and needs to sleep. Family states she has not had dialysis since Wednesday. States pt was just released from [facility] on the 9th and was able to walk with a walker, over the past few days she is not weight bearing. Pt was diagnosed with the flu during her last admission.    Allergies Allergies  Allergen Reactions   Avandia [Rosiglitazone Maleate] Other (See Comments)    Congestive heart failure   Cephalexin Anaphylaxis, Swelling and Rash    Extremities swell , throat swelling    Clindamycin Anaphylaxis and Shortness Of Breath    esophageal spasms   Humalog [Insulin  Lispro] Itching     big knots and whelp    Lincomycin Anaphylaxis and Shortness Of Breath   Metformin Other (See Comments)    Congestive heart failure   Novolog  [Insulin  Aspart (Human Analog)] Hives and Itching    Humalog & Novolog  big knots and whelp     Penicillins Anaphylaxis, Hives, Swelling and Rash    Swelling of the face/tongue/throat, SOB, or low BP Childhood allergy   Tizanidine Other (See Comments)    Dizziness, Mental Status Changes, Hallucination     Adhesive [Tape] Other (See Comments)    Tears skin, Please use paper tape   Atorvastatin Other (See Comments)    increased LFT's, no muscle weakness, leg pain   Cholestyramine Other (See Comments)    Liver Disorder Elevated LFTs     Gentamycin [Gentamicin ] Hives, Itching and  Rash    Fine red bumps.  Reaction noted post loop recorder implant, where gentamycin was used for irrigation. Topical prep   Iodinated Contrast Media Other (See Comments)    PAIN DURING LYMPHANGIOGRAM '81, NO ALLERGY TO IV CONTRAST.  Has had procedures with Iodine since without problems.    Limonene Hives, Itching, Rash and Other (See Comments)    Reaction noted post loop recorder implant, where gentamycin was used for irrigation. Topical prep   Prednisone  Itching and Other (See Comments)    B/P went high, itching all over.  Tolerates with benadryl     Ranexa  [Ranolazine ] Other (See Comments)    Nausea, dizziness, low blood sugar, tingling in right hand, blood blisters on tongue    Rosuvastatin Nausea And Vomiting, Nausea Only and Other (See Comments)    Muscle and joint pain & increased LFTs; tolerates Pravastatin  10mg  (max)   Simvastatin  Other (See Comments)    Increased LFT's    Zinc      Unknown   Acetaminophen      Pt told not to take tylenol  due to liver issues   Azelastine  Other (See Comments)    Metallic taste,spitting up blood   Clopidogrel  Other (See Comments)    Does not work for patient Medicine was not effective   Kerendia  [Finerenone ] Hives and Nausea Only   Lisinopril  Diarrhea, Nausea Only and Other (See Comments)   Neosporin [Bacitracin -Polymyxin B ] Dermatitis  Codeine Nausea And Vomiting   Erythromycin  Nausea And Vomiting   Hydrocodone -Acetaminophen  Itching, Rash and Other (See Comments)    itching   Latex Itching and Other (See Comments)    Iif wear gloves hands get red ,itchy no problem if other wear and touch me   Neomycin Rash and Other (See Comments)    Redness   Nickel Itching   Ranitidine Nausea Only   Tamiflu [Oseltamivir] Nausea And Vomiting   Tramadol Nausea Only    Level of Care/Admitting Diagnosis ED Disposition     ED Disposition  Admit   Condition  --   Comment  Hospital Area: [facility] [[id number]]  Level of Care: Stepdown  [14]  Covid Evaluation: Confirmed COVID Negative  Diagnosis: Lethargy [758190]  Admitting Physician: TOBIE MARIO LULLA DEDRA  Attending Physician: TOBIE MARIO LULLA DEDRA  Certification:: I certify this patient will need inpatient services for at least 2 midnights  Expected Medical Readiness: 08/07/2023          B Medical/Surgery History Past Medical History:  Diagnosis Date   Anxiety    Aortic stenosis    a. bicuspid aortic valve; mean gradient of 20 mmHg in 2/10; b. s/p bioprosth AVR (19-mm Edwards pericardial valve) with a redo coronary artery bypass graft procedure in 07/2009; postoperative Dressler's syndrome     Arthritis    Breast cancer (HCC)    Carcinoma, renal cell (HCC) 05/2007   Laparoscopic right nephrectomy   Chronic diastolic CHF (congestive heart failure) (HCC)    a. 9.2014 EchoP EF 60-65%, no rwma, bioprosth AVR, mean gradient of , mild to mod MR, PASP .   Colitis, ischemic (HCC) 2008   Complication of anesthesia    Coronary artery disease    a. initial CABG with SVG-RCA in 12/05. b. redo SVG-RCA and bioprosthetic AVR in 1/11. d. 9/14 - PCI with DES to distal LM/proximal LAD was done rather than bypass as she would have been a poor candidate for redo sternotomy. d. S/p DES to prox LM 05/2014.   Diabetes mellitus 03/2010    TYPE II: Hemoglobin A1c of 7.4 in  03/2010; 8.4 in 06/2010; treated with insulin  pump   Duodenal ulcer    Remote; H. pylori positive   Dyspnea    Gastroparesis due to DM (HCC) 05/01/2012   Glaucoma 1998   Hodgkin's disease (HCC) 1991   Mantle radiation therapy   Hyperlipidemia    Hyperplastic gastric polyp 06/23/2012   Hypertension    Iron  deficiency anemia    a. 03/2013 EGD: essentially normal. Possible slow GIB.   Migraines    NASH (nonalcoholic steatohepatitis) 1999   -biopsy in 1999   OSA treated with BiPAP    Osteoporosis    Pacemaker    six sinus syndrome   PAF (paroxysmal atrial fibrillation) (HCC)    a.  h/o brief  episodes noted on ppm interrogation. b. given GI bleeding and need to be on both ASA 81 and Brilinta , risks likely outweigh benefits of anticoagulation.    PONV (postoperative nausea and vomiting)    Small bowel obstruction (HCC)    Recurrent; resolved after resection of a lipoma   Syncope    a. H/o recurrent syncope with pauses on loop recorder s/p Medtronic pacemaker 07/2012.   Past Surgical History:  Procedure Laterality Date   ABDOMINAL HYSTERECTOMY  2000   AORTIC VALVE REPLACEMENT  2011   Aortic valve replacement surgery with a 19 mm bioprosthetic device post redo CABG January 2011.  AV FISTULA PLACEMENT Right 05/01/2023   Procedure: RIGHT UPPER ARM BRACHIOBASILIC ARTERIOVENOUS FISTULA CREATION;  Surgeon: Serene Gaile ORN, MD;  Location: [facility] OR;  Service: Vascular;  Laterality: Right;   BONE MARROW BIOPSY     BREAST EXCISIONAL BIOPSY     benign, bilateral   CARDIAC CATHETERIZATION     CERVICAL BIOPSY     COLONOSCOPY     Multiple with adenomatous polyps   COLONOSCOPY  06/23/2012   Procedure: COLONOSCOPY;  Surgeon: Lupita FORBES Commander, MD;  Location: [facility] ENDOSCOPY;  Service: Endoscopy;  Laterality: N/A;   CORONARY ARTERY BYPASS GRAFT  2005, 2011   CABG-most recent SVG to RCA-12/05; RCA occluded with patent graft in 2006; Redo bypass in 07/2009   ESOPHAGOGASTRODUODENOSCOPY     ESOPHAGOGASTRODUODENOSCOPY  06/23/2012   Procedure: ESOPHAGOGASTRODUODENOSCOPY (EGD);  Surgeon: Lupita FORBES Commander, MD;  Location: [facility];  Service: Endoscopy;  Laterality: N/A;   ESOPHAGOGASTRODUODENOSCOPY N/A 04/06/2013   Procedure: ESOPHAGOGASTRODUODENOSCOPY (EGD);  Surgeon: Norleen LOISE Kiang, MD;  Location: [facility] ENDOSCOPY;  Service: Endoscopy;  Laterality: N/A;   EXPLORATORY LAPAROTOMY W/ BOWEL RESECTION     Small bowel resection of lipoma   GIVENS CAPSULE STUDY N/A 04/30/2013   Procedure: GIVENS CAPSULE STUDY;  Surgeon: Lupita FORBES Commander, MD;  Location: [facility] ENDOSCOPY;  Service: Endoscopy;  Laterality: N/A;   INSERTION OF  DIALYSIS CATHETER Right 05/01/2023   Procedure: INSERTION OF TUNNELED DIALYSIS CATHETER USING PALINDROME CATHETER 23CM;  Surgeon: Serene Gaile ORN, MD;  Location: [facility] OR;  Service: Vascular;  Laterality: Right;   LEFT AND RIGHT HEART CATHETERIZATION WITH CORONARY ANGIOGRAM N/A 04/07/2013   Procedure: LEFT AND RIGHT HEART CATHETERIZATION WITH CORONARY ANGIOGRAM;  Surgeon: Ezra GORMAN Shuck, MD;  Location: [facility] CATH LAB;  Service: Cardiovascular;  Laterality: N/A;   LEFT ATRIAL APPENDAGE OCCLUSION N/A 02/16/2016   Watchman not placed - nickel allergy   LEFT HEART CATHETERIZATION WITH CORONARY/GRAFT ANGIOGRAM N/A 06/07/2014   Procedure: LEFT HEART CATHETERIZATION WITH EL BILE;  Surgeon: Lonni JONETTA Cash, MD;  Location: [facility] CATH LAB;  Service: Cardiovascular;  Laterality: N/A;   LIVER BIOPSY     LOOP RECORDER IMPLANT N/A 12/10/2011   Procedure: LOOP RECORDER IMPLANT;  Surgeon: Danelle ORN Birmingham, MD;  Location: [facility] CATH LAB;  Service: Cardiovascular;  Laterality: N/A;   MALONEY DILATION  06/23/2012   Procedure: AGAPITO DILATION;  Surgeon: Lupita FORBES Commander, MD;  Location: [facility] ENDOSCOPY;  Service: Endoscopy;;  54 fr   MASTECTOMY Left 05/17/2015   total - no lymph nodes removed per patient   NEPHRECTOMY  2008   Laparoscopic right nephrectomy, renal cell carcinoma   PACEMAKER INSERTION  08/21/2012   Medtronic Adapta L dual-chamber pacemaker   PERCUTANEOUS CORONARY STENT INTERVENTION (PCI-S) N/A 04/09/2013   Procedure: PERCUTANEOUS CORONARY STENT INTERVENTION (PCI-S);  Surgeon: Lonni JONETTA Cash, MD;  Location: [facility] CATH LAB;  Service: Cardiovascular;  Laterality: N/A;   PERMANENT PACEMAKER INSERTION N/A 08/21/2012   Procedure: PERMANENT PACEMAKER INSERTION;  Surgeon: Danelle ORN Birmingham, MD;  Location: [facility] CATH LAB;  Service: Cardiovascular;  Laterality: N/A;   RIGHT HEART CATH N/A 03/18/2019   Procedure: RIGHT HEART CATH;  Surgeon: Shuck Ezra GORMAN, MD;  Location: [facility] INVASIVE CV LAB;  Service:  Cardiovascular;  Laterality: N/A;   RIGHT HEART CATH N/A 08/21/2022   Procedure: RIGHT HEART CATH;  Surgeon: Shuck Ezra GORMAN, MD;  Location: [facility] INVASIVE CV LAB;  Service: Cardiovascular;  Laterality: N/A;   RIGHT HEART CATH AND CORONARY/GRAFT ANGIOGRAPHY N/A 04/19/2017   Procedure: RIGHT  HEART CATH AND CORONARY/GRAFT ANGIOGRAPHY;  Surgeon: Rolan Ezra RAMAN, MD;  Location: [facility] INVASIVE CV LAB;  Service: Cardiovascular;  Laterality: N/A;   TEE WITHOUT CARDIOVERSION N/A 12/07/2015   Procedure: TRANSESOPHAGEAL ECHOCARDIOGRAM (TEE);  Surgeon: Ezra RAMAN Rolan, MD;  Location: [facility] ENDOSCOPY;  Service: Cardiovascular;  Laterality: N/A;   TOTAL ABDOMINAL HYSTERECTOMY W/ BILATERAL SALPINGOOPHORECTOMY  2000   endometriosis and ovarian cysts   TOTAL MASTECTOMY Left 05/17/2015   Procedure: LEFT TOTAL MASTECTOMY;  Surgeon: Donnice Bury, MD;  Location: [facility] OR;  Service: General;  Laterality: Left;   TUMOR EXCISION  1981   removal of Hodgkins lymphoma     A IV Location/Drains/Wounds Patient Lines/Drains/Airways Status     Active Line/Drains/Airways     Name Placement date Placement time Site Days   Peripheral IV 07/25/23 22 G Left;Posterior Hand 07/25/23  0019  Hand  10   Peripheral IV 08/04/23 20 G 1 Left;Posterior Forearm 08/04/23  1646  Forearm  less than 1   Fistula / Graft Right Arteriovenous fistula --  --  --  --   Hemodialysis Catheter Right Internal jugular Double lumen Permanent (Tunneled) 05/01/23  0948  Internal jugular  95   Closed System Drain 1 Left Breast 05/17/15  --  Breast  3001   External Urinary Catheter 08/04/23  1632  --  less than 1            Intake/Output Last 24 hours No intake or output data in the 24 hours ending 08/04/23 2338  Labs/Imaging Results for orders placed or performed during the hospital encounter of 08/04/23 (from the past 48 hours)  TSH     Status: None   Collection Time: 08/04/23  3:54 PM  Result Value Ref Range   TSH 1.304 0.350 - 4.500 uIU/mL     Comment: Performed by a 3rd Generation assay with a functional sensitivity of <=0.01 uIU/mL. Performed at [facility], [address], [place], [place] [address]   Lactic acid, plasma     Status: None   Collection Time: 08/04/23  4:00 PM  Result Value Ref Range   Lactic Acid, Venous 0.9 0.5 - 1.9 mmol/L    Comment: Performed at [facility], [address] [place], [place] [address]  Comprehensive metabolic panel     Status: Abnormal   Collection Time: 08/04/23  4:00 PM  Result Value Ref Range   Sodium 128 (L) 135 - 145 mmol/L   Potassium 3.8 3.5 - 5.1 mmol/L   Chloride 93 (L) 98 - 111 mmol/L   CO2 24 22 - 32 mmol/L   Glucose, Bld 202 (H) 70 - 99 mg/dL    Comment: Glucose reference range applies only to samples taken after fasting for at least 8 hours.   BUN 85 (H) 8 - 23 mg/dL   Creatinine, Ser 7.06 (H) 0.44 - 1.00 mg/dL   Calcium  9.6 8.9 - 10.3 mg/dL   Total Protein 7.0 6.5 - 8.1 g/dL   Albumin  3.0 (L) 3.5 - 5.0 g/dL   AST 24 15 - 41 U/L   ALT 16 0 - 44 U/L   Alkaline Phosphatase 241 (H) 38 - 126 U/L   Total Bilirubin 2.0 (H) 0.0 - 1.2 mg/dL   GFR, Estimated 17 (L) >60 mL/min    Comment: (NOTE) Calculated using the CKD-EPI Creatinine Equation (2021)    Anion gap 11 5 - 15    Comment: Performed at [facility], [address], [place], [place] [address]  CBC with Differential  Status: Abnormal   Collection Time: 08/04/23  4:00 PM  Result Value Ref Range   WBC 10.6 (H) 4.0 - 10.5 K/uL   RBC 3.07 (L) 3.87 - 5.11 MIL/uL   Hemoglobin 9.5 (L) 12.0 - 15.0 g/dL   HCT 71.9 (L) 63.9 - 53.9 %   MCV 91.2 80.0 - 100.0 fL   MCH 30.9 26.0 - 34.0 pg   MCHC 33.9 30.0 - 36.0 g/dL   RDW 85.6 88.4 - 84.4 %   Platelets 186 150 - 400 K/uL   nRBC 0.0 0.0 - 0.2 %   Neutrophils Relative % 78 %   Neutro Abs 8.4 (H) 1.7 - 7.7 K/uL   Lymphocytes Relative 9 %   Lymphs Abs 1.0 0.7 - 4.0 K/uL   Monocytes Relative 11 %   Monocytes Absolute 1.1 (H) 0.1 - 1.0 K/uL   Eosinophils Relative 0 %    Eosinophils Absolute 0.0 0.0 - 0.5 K/uL   Basophils Relative 1 %   Basophils Absolute 0.1 0.0 - 0.1 K/uL   Immature Granulocytes 1 %   Abs Immature Granulocytes 0.06 0.00 - 0.07 K/uL    Comment: Performed at [facility], [address] [place], [place] [address]  Protime-INR     Status: Abnormal   Collection Time: 08/04/23  4:00 PM  Result Value Ref Range   Prothrombin Time 39.1 (H) 11.4 - 15.2 seconds   INR 4.0 (H) 0.8 - 1.2    Comment: (NOTE) INR goal varies based on device and disease states. Performed at [facility], [address] [place], [place] [address]   APTT     Status: Abnormal   Collection Time: 08/04/23  4:00 PM  Result Value Ref Range   aPTT 52 (H) 24 - 36 seconds    Comment:        IF BASELINE aPTT IS ELEVATED, SUGGEST PATIENT RISK ASSESSMENT BE USED TO DETERMINE APPROPRIATE ANTICOAGULANT THERAPY. Performed at [facility], [address], [place], [place] [address]   Blood Culture (routine x 2)     Status: None (Preliminary result)   Collection Time: 08/04/23  4:00 PM   Specimen: Blood  Result Value Ref Range   Specimen Description      LEFT ANTECUBITAL BOTTLES DRAWN AEROBIC AND ANAEROBIC   Special Requests      Blood Culture adequate volume Performed at [facility], [address] [place], [place] [address]    Culture PENDING    Report Status PENDING   Blood Culture (routine x 2)     Status: None (Preliminary result)   Collection Time: 08/04/23  4:00 PM   Specimen: Blood  Result Value Ref Range   Specimen Description BLOOD LEFT ARM BOTTLES DRAWN AEROBIC AND ANAEROBIC    Special Requests      Blood Culture adequate volume Performed at [facility], [address], [place], [place] [address]    Culture PENDING    Report Status PENDING   Blood gas, venous ([facility], [facility], [facility])     Status: Abnormal   Collection Time: 08/04/23  4:00 PM  Result Value Ref Range   pH, Ven 7.43 7.25 - 7.43   pCO2, Ven 41 (L) 44 - 60 mmHg   pO2, Ven 50 (H) 32 - 45 mmHg    Bicarbonate 27.2 20.0 - 28.0 mmol/L   Acid-Base Excess 2.4 (H) 0.0 - 2.0 mmol/L   O2 Saturation 84 %   Patient temperature 36.5    Collection site LEFT ANTECUBITAL  Drawn by [phone]     Comment: Performed at [facility], [address], [place], [place] [address]  Lactic acid, plasma     Status: None   Collection Time: 08/04/23  7:06 PM  Result Value Ref Range   Lactic Acid, Venous 1.0 0.5 - 1.9 mmol/L    Comment: Performed at [facility], [address] [place], [place] [address]  Ammonia     Status: None   Collection Time: 08/04/23  9:48 PM  Result Value Ref Range   Ammonia 35 9 - 35 umol/L    Comment: Performed at [facility], [address] [place], [place] [address]  CK     Status: Abnormal   Collection Time: 08/04/23  9:48 PM  Result Value Ref Range   Total CK 18 (L) 38 - 234 U/L    Comment: Performed at [facility], [address], [place], [place] [address]  CBG monitoring, ED     Status: Abnormal   Collection Time: 08/04/23 11:05 PM  Result Value Ref Range   Glucose-Capillary 168 (H) 70 - 99 mg/dL    Comment: Glucose reference range applies only to samples taken after fasting for at least 8 hours.   *Note: Due to a large number of results and/or encounters for the requested time period, some results have not been displayed. A complete set of results can be found in Results Review.   CT Chest Wo Contrast Result Date: 08/04/2023 CLINICAL DATA:  Pneumonia, complication suspected, xray done Failure to thrive. EXAM: CT CHEST WITHOUT CONTRAST TECHNIQUE: Multidetector CT imaging of the chest was performed following the standard protocol without IV contrast. RADIATION DOSE REDUCTION: This exam was performed according to the departmental dose-optimization program which includes automated exposure control, adjustment of the mA and/or kV according to patient size and/or use of iterative reconstruction technique. COMPARISON:  Radiograph earlier today. CT 5 days ago 07/30/2023. Chest CT  08/10/2022 also reviewed FINDINGS: Cardiovascular: Dialysis catheter tip in the right atrium. The heart is normal in size. Dense calcification of native coronary arteries. Prosthetic aortic valve. Dense mitral annulus calcifications. Densely calcified thoracic aorta without aneurysm. Left-sided pacemaker with lead tips in the right atrium and ventricle. No significant pericardial effusion. Mediastinum/Nodes: Technically limited assessment in the absence of IV contrast and paucity of mediastinal fat. There is scattered small mediastinal lymph nodes, none of which are enlarged by size criteria. Hilar assessment is limited on this unenhanced exam. The esophagus is decompressed. Lungs/Pleura: Loculated right pleural effusion with fluid tracking into the minor fissure as well as anterior laterally, without significant interval change. There is also a small medial component at the right lung base associated compressive atelectasis throughout the right lung. Nodular density at the right lung apex measures 13 mm, without significant interval change from exam last year, likely scarring. Additional left apical pleuroparenchymal scarring is similar. There is overall improvement in the multifocal ground-glass opacities in the left lung consistent with resolving pneumonia. Mild residual patchy and nodular areas of ground-glass persist. Greatest residual is in the left lower lobe measuring 11 mm series 3, image 71. In area of ill-defined increased density within the basilar right lower lobe is unchanged over the last 5 days. Right lower lobe mucoid impaction within many segmental bronchi. Upper Abdomen: Gallstones. Posterior left renal cyst is unchanged from 2023. No further follow-up imaging is recommended. Musculoskeletal: Stable over the last 5 days. Minor T7 superior endplate compression deformity. Median sternotomy. IMPRESSION: 1. Moderate-sized loculated pleural effusion is unchanged over  the last 5 days. There is adjacent  compressive atelectasis throughout the right lung. 2. Improvement in patchy areas of ground-glass likely resolving pneumonia. Areas of ground-glass persist, recommend CT follow-up to resolution after course of treatment. CT in 3 months would be reasonable. 3. Persistent area of increased density at the right lung base. This may represent an area of chronic atelectasis/aspiration, however is mention previously, difficult to exclude neoplasm. Consider pulmonary follow-up and outpatient PET CT as clinically indicated. 4. Stable area of nodular thickening at the right lung apex, favored to be scarring. 5. Additional stable chronic findings as described. Aortic Atherosclerosis (ICD10-I70.0). Electronically Signed   By: Andrea Gasman M.D.   On: 08/04/2023 18:40   DG Chest Port 1 View Result Date: 08/04/2023 CLINICAL DATA:  Questionable sepsis - evaluate for abnormality Failure to thrive. EXAM: PORTABLE CHEST 1 VIEW COMPARISON:  Radiograph 07/29/2023, CT 07/30/2023 FINDINGS: Right-sided dialysis catheter remains in place. Left-sided pacemaker in place. Prior median sternotomy with prosthetic aortic valve. Loculated right pleural effusion with questionable increase from prior exam. Associated opacity in the right hemithorax is without significant interval change. Areas of ground-glass consolidation in the left lung on prior CT are not well demonstrated by radiograph. No pneumothorax. No convincing pulmonary edema. IMPRESSION: 1. Stable radiographic appearance of the chest. 2. Loculated right pleural effusion and opacities in the right hemithorax are unchanged over the last 6 days. 3. Additional patchy airspace disease on prior CT have no definite radiographic correlate. Electronically Signed   By: Andrea Gasman M.D.   On: 08/04/2023 16:06    Pending Labs Unresulted Labs (From admission, onward)     Start     Ordered   08/05/23 0500  Comprehensive metabolic panel  Tomorrow morning,   R        08/04/23 2218    08/05/23 0500  CBC  Tomorrow morning,   R        08/04/23 2218   08/05/23 0500  Protime-INR  Tomorrow morning,   R        08/04/23 2218   08/04/23 2214  T4, free  Once,   R        08/04/23 2218            Vitals/Pain Today's Vitals   08/04/23 2130 08/04/23 2134 08/04/23 2200 08/04/23 2245  BP: (!) 154/60  (!) 118/98 (!) 119/48  Pulse: (!) 118  (!) 117 (!) 106  Resp: 20  19 20   Temp:   98.4 F (36.9 C)   TempSrc:      SpO2: 96%  91% 94%  Weight:      Height:      PainSc:  4       Isolation Precautions No active isolations  Medications Medications  sodium chloride  flush (NS) 0.9 % injection 3 mL (3 mLs Intravenous Given 08/04/23 2238)  sodium chloride  flush (NS) 0.9 % injection 3 mL (3 mLs Intravenous Given 08/04/23 2238)  sodium chloride  flush (NS) 0.9 % injection 3 mL (has no administration in time range)  0.9 %  sodium chloride  infusion (has no administration in time range)  dextrose  5 %-0.9 % sodium chloride  infusion (has no administration in time range)  doxycycline  (VIBRAMYCIN ) 100 mg in dextrose  5 % 250 mL IVPB (has no administration in time range)  ondansetron  (ZOFRAN ) injection 4 mg (4 mg Intravenous Given 08/04/23 2101)    Mobility non-ambulatory     Focused Assessments Neuro Assessment Handoff:  Swallow screen pass? Yes  Cardiac  Rhythm: Sinus tachycardia       Neuro Assessment: Exceptions to WDL Neuro Checks:      Has TPA been given? No If patient is a Neuro Trauma and patient is going to OR before floor call report to 4N Charge nurse: [phone] or [phone]   R Recommendations: See Admitting Provider Note  Report given to:   Additional Notes: pt is A&O x 4 has family at bedside. Running sinus tach on the monitor.

## 2023-08-04 NOTE — H&P (Signed)
 History and Physical    Patient: [name] [name] FMW:[id number] DOB: 18-Jul-1951 DOA: 08/04/2023 DOS: the patient was seen and examined on 08/04/2023 PCP: Rolinda Millman, MD  Patient coming from: Home Chief complaint: Chief Complaint  Patient presents with   Failure To Thrive   HPI:  [name] [name] is a 73 y.o. 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 Pt has H/O is FTT. Pt was just d/c Wednesday. And since then she has been very weak. Pt lives with spouse, but he is not able to lift her and move her and take her to dialysis.  Since coming home patient has been lethargic and weak and decreased p.o. intake insulin  has been held due to that. Spouse states she has had left ear pain and has had bruising from the face mask last time she had when admitted.  >>ED Course: In emergency room patient is lethargic and engaging very tired and ill-appearing vitals are as below and stable patient's oxygenation is improving and has been started on oxygen. Vitals:   08/04/23 2100 08/04/23 2130 08/04/23 2200 08/04/23 2245  BP: (!) 126/55 (!) 154/60 (!) 118/98 (!) 119/48  Pulse: (!) 115 (!) 118 (!) 117 (!) 106  Temp:   98.4 F (36.9 C)   Resp: 14 20 19 20   Height:      Weight:      SpO2: 97% 96% 91% 94%  TempSrc:      BMI (Calculated):      EKG shows sinus tachycardia at 103 left atrial enlargement, left anterior fascicular block LVH, prominent T waves in the lateral leads, QTc 481. Evaluation so far shows: Venous blood gas done in the ED showing pO2 of 50 and pCO2 of 41. Metabolic panel shows sodium  128 normal potassium glucose 2 2 creatinine 2.93 anion gap of 11 alk phos 241 total bili of 2.0.  Lactic acid of 0.9 CBC shows white count of 10.6 hemoglobin of 9.5 platelets of 186. PT 39.1 INR 4.0. Blood cultures collected in the ED. CT chest done today again shows moderate size loculated pleural effusion improving pneumonia, persistent density at the right lung base, stable area of nodular thickening in the right lung apex please refer to complete report. 2D echo-July 30, 2023: 2D echo showing estimated ejection fraction of 70 to 75% left ventricular has hyperdynamic function, right ventricular pressure and volume overload, reduced systolic function the right ventricle, bioprosthetic aortic valve.   In the ED pt received Medications  ondansetron  (ZOFRAN ) injection 4 mg (4 mg Intravenous Given 08/04/23 2101)   Review of Systems  Unable to perform ROS: Acuity of condition  Constitutional:  Positive for malaise/fatigue.  Neurological:  Positive for weakness.   Past Medical History:  Diagnosis Date   Anxiety    Aortic stenosis    a. bicuspid aortic valve; mean gradient of 20 mmHg in 2/10; b. s/p bioprosth AVR (19-mm Edwards pericardial valve) with a redo coronary artery bypass graft procedure in 07/2009; postoperative Dressler's syndrome     Arthritis    Breast cancer (HCC)  Carcinoma, renal cell (HCC) 05/2007   Laparoscopic right nephrectomy   Chronic diastolic CHF (congestive heart failure) (HCC)    a. 9.2014 EchoP EF 60-65%, no rwma, bioprosth AVR, mean gradient of , mild to mod MR, PASP .   Colitis, ischemic (HCC) 2008   Complication of anesthesia    Coronary artery disease    a. initial CABG with SVG-RCA in 12/05. b. redo SVG-RCA and bioprosthetic AVR in 1/11. d. 9/14 - PCI with DES to distal LM/proximal LAD was done rather than bypass as she would have been a poor candidate for redo sternotomy. d. S/p DES to prox LM 05/2014.   Diabetes mellitus 03/2010    TYPE II:  Hemoglobin A1c of 7.4 in  03/2010; 8.4 in 06/2010; treated with insulin  pump   Duodenal ulcer    Remote; H. pylori positive   Dyspnea    Gastroparesis due to DM (HCC) 05/01/2012   Glaucoma 1998   Hodgkin's disease (HCC) 1991   Mantle radiation therapy   Hyperlipidemia    Hyperplastic gastric polyp 06/23/2012   Hypertension    Iron  deficiency anemia    a. 03/2013 EGD: essentially normal. Possible slow GIB.   Migraines    NASH (nonalcoholic steatohepatitis) 1999   -biopsy in 1999   OSA treated with BiPAP    Osteoporosis    Pacemaker    six sinus syndrome   PAF (paroxysmal atrial fibrillation) (HCC)    a.  h/o brief episodes noted on ppm interrogation. b. given GI bleeding and need to be on both ASA 81 and Brilinta , risks likely outweigh benefits of anticoagulation.    PONV (postoperative nausea and vomiting)    Small bowel obstruction (HCC)    Recurrent; resolved after resection of a lipoma   Syncope    a. H/o recurrent syncope with pauses on loop recorder s/p Medtronic pacemaker 07/2012.   Past Surgical History:  Procedure Laterality Date   ABDOMINAL HYSTERECTOMY  2000   AORTIC VALVE REPLACEMENT  2011   Aortic valve replacement surgery with a 19 mm bioprosthetic device post redo CABG January 2011.   AV FISTULA PLACEMENT Right 05/01/2023   Procedure: RIGHT UPPER ARM BRACHIOBASILIC ARTERIOVENOUS FISTULA CREATION;  Surgeon: Serene Gaile ORN, MD;  Location: [facility] OR;  Service: Vascular;  Laterality: Right;   BONE MARROW BIOPSY     BREAST EXCISIONAL BIOPSY     benign, bilateral   CARDIAC CATHETERIZATION     CERVICAL BIOPSY     COLONOSCOPY     Multiple with adenomatous polyps   COLONOSCOPY  06/23/2012   Procedure: COLONOSCOPY;  Surgeon: Lupita FORBES Commander, MD;  Location: [facility] ENDOSCOPY;  Service: Endoscopy;  Laterality: N/A;   CORONARY ARTERY BYPASS GRAFT  2005, 2011   CABG-most recent SVG to RCA-12/05; RCA occluded with patent graft in 2006; Redo bypass in 07/2009    ESOPHAGOGASTRODUODENOSCOPY     ESOPHAGOGASTRODUODENOSCOPY  06/23/2012   Procedure: ESOPHAGOGASTRODUODENOSCOPY (EGD);  Surgeon: Lupita FORBES Commander, MD;  Location: [facility];  Service: Endoscopy;  Laterality: N/A;   ESOPHAGOGASTRODUODENOSCOPY N/A 04/06/2013   Procedure: ESOPHAGOGASTRODUODENOSCOPY (EGD);  Surgeon: Norleen LOISE Kiang, MD;  Location: [facility] END[facility]COPY;  Service: Endoscopy;  Laterality: N/A;   EXPLORATORY LAPAROTOMY W/ BOWEL RESECTION     Small bowel resection of lipoma   GIVENS CAPSULE STUDY N/A 04/30/2013   Procedure: GIVENS CAPSULE STUDY;  Surgeon: Lupita FORBES Commander, MD;  Location: [facility]COPY;  Service: Endoscopy;  Laterality: N/A;   INSERTION OF DIALYSIS CATHETER Right 05/01/2023   Procedure: INSERTION  OF TUNNELED DIALYSIS CATHETER USING PALINDROME CATHETER 23CM;  Surgeon: Serene Gaile ORN, MD;  Location: [facility] OR;  Service: Vascular;  Laterality: Right;   LEFT AND RIGHT HEART CATHETERIZATION WITH CORONARY ANGIOGRAM N/A 04/07/2013   Procedure: LEFT AND RIGHT HEART CATHETERIZATION WITH CORONARY ANGIOGRAM;  Surgeon: Ezra GORMAN Shuck, MD;  Location: [facility] CATH LAB;  Service: Cardiovascular;  Laterality: N/A;   LEFT ATRIAL APPENDAGE OCCLUSION N/A 02/16/2016   Watchman not placed - nickel allergy   LEFT HEART CATHETERIZATION WITH CORONARY/GRAFT ANGIOGRAM N/A 06/07/2014   Procedure: LEFT HEART CATHETERIZATION WITH EL BILE;  Surgeon: Lonni JONETTA Cash, MD;  Location: [facility] CATH LAB;  Service: Cardiovascular;  Laterality: N/A;   LIVER BIOPSY     LOOP RECORDER IMPLANT N/A 12/10/2011   Procedure: LOOP RECORDER IMPLANT;  Surgeon: Danelle ORN Birmingham, MD;  Location: [facility] CATH LAB;  Service: Cardiovascular;  Laterality: N/A;   MALONEY DILATION  06/23/2012   Procedure: AGAPITO DILATION;  Surgeon: Lupita FORBES Commander, MD;  Location: [facility] ENDOSCOPY;  Service: Endoscopy;;  54 fr   MASTECTOMY Left 05/17/2015   total - no lymph nodes removed per patient   NEPHRECTOMY  2008   Laparoscopic right nephrectomy,  renal cell carcinoma   PACEMAKER INSERTION  08/21/2012   Medtronic Adapta L dual-chamber pacemaker   PERCUTANEOUS CORONARY STENT INTERVENTION (PCI-S) N/A 04/09/2013   Procedure: PERCUTANEOUS CORONARY STENT INTERVENTION (PCI-S);  Surgeon: Lonni JONETTA Cash, MD;  Location: [facility] CATH LAB;  Service: Cardiovascular;  Laterality: N/A;   PERMANENT PACEMAKER INSERTION N/A 08/21/2012   Procedure: PERMANENT PACEMAKER INSERTION;  Surgeon: Danelle ORN Birmingham, MD;  Location: [facility] CATH LAB;  Service: Cardiovascular;  Laterality: N/A;   RIGHT HEART CATH N/A 03/18/2019   Procedure: RIGHT HEART CATH;  Surgeon: Shuck Ezra GORMAN, MD;  Location: [facility] INVASIVE CV LAB;  Service: Cardiovascular;  Laterality: N/A;   RIGHT HEART CATH N/A 08/21/2022   Procedure: RIGHT HEART CATH;  Surgeon: Shuck Ezra GORMAN, MD;  Location: [name] INVASIVE CV LAB;  Service: Cardiovascular;  Laterality: N/A;   RIGHT HEART CATH AND CORONARY/GRAFT ANGIOGRAPHY N/A 04/19/2017   Procedure: RIGHT HEART CATH AND CORONARY/GRAFT ANGIOGRAPHY;  Surgeon: Shuck Ezra GORMAN, MD;  Location: [facility] INVASIVE CV LAB;  Service: Cardiovascular;  Laterality: N/A;   TEE WITHOUT CARDIOVERSION N/A 12/07/2015   Procedure: TRANSESOPHAGEAL ECHOCARDIOGRAM (TEE);  Surgeon: Ezra GORMAN Shuck, MD;  Location: [facility] ENDOSCOPY;  Service: Cardiovascular;  Laterality: N/A;   TOTAL ABDOMINAL HYSTERECTOMY W/ BILATERAL SALPINGOOPHORECTOMY  2000   endometriosis and ovarian cysts   TOTAL MASTECTOMY Left 05/17/2015   Procedure: LEFT TOTAL MASTECTOMY;  Surgeon: Donnice Bury, MD;  Location: [facility] OR;  Service: General;  Laterality: Left;   TUMOR EXCISION  1981   removal of Hodgkins lymphoma    reports that she has never smoked. She has never used smokeless tobacco. She reports that she does not drink alcohol and does not use drugs.  Allergies  Allergen Reactions   Avandia [Rosiglitazone Maleate] Other (See Comments)    Congestive heart failure   Cephalexin Anaphylaxis, Swelling and Rash     Extremities swell , throat swelling    Clindamycin Anaphylaxis and Shortness Of Breath    esophageal spasms   Humalog [Insulin  Lispro] Itching     big knots and whelp    Lincomycin Anaphylaxis and Shortness Of Breath   Metformin Other (See Comments)    Congestive heart failure   Novolog  [Insulin  Aspart (Human Analog)] Hives and Itching    Humalog & Novolog  big knots and whelp  Penicillins Anaphylaxis, Hives, Swelling and Rash    Swelling of the face/tongue/throat, SOB, or low BP Childhood allergy   Tizanidine Other (See Comments)    Dizziness, Mental Status Changes, Hallucination     Adhesive [Tape] Other (See Comments)    Tears skin, Please use paper tape   Atorvastatin Other (See Comments)    increased LFT's, no muscle weakness, leg pain   Cholestyramine Other (See Comments)    Liver Disorder Elevated LFTs     Gentamycin [Gentamicin ] Hives, Itching and Rash    Fine red bumps.  Reaction noted post loop recorder implant, where gentamycin was used for irrigation. Topical prep   Iodinated Contrast Media Other (See Comments)    PAIN DURING LYMPHANGIOGRAM '81, NO ALLERGY TO IV CONTRAST.  Has had procedures with Iodine since without problems.    Limonene Hives, Itching, Rash and Other (See Comments)    Reaction noted post loop recorder implant, where gentamycin was used for irrigation. Topical prep   Prednisone  Itching and Other (See Comments)    B/P went high, itching all over.  Tolerates with benadryl     Ranexa  [Ranolazine ] Other (See Comments)    Nausea, dizziness, low blood sugar, tingling in right hand, blood blisters on tongue    Rosuvastatin Nausea And Vomiting, Nausea Only and Other (See Comments)    Muscle and joint pain & increased LFTs; tolerates Pravastatin  10mg  (max)   Simvastatin  Other (See Comments)    Increased LFT's    Zinc      Unknown   Acetaminophen      Pt told not to take tylenol  due to liver issues   Azelastine  Other (See Comments)    Metallic  taste,spitting up blood   Clopidogrel  Other (See Comments)    Does not work for patient Medicine was not effective   Kerendia  [Finerenone ] Hives and Nausea Only   Lisinopril  Diarrhea, Nausea Only and Other (See Comments)   Neosporin [Bacitracin -Polymyxin B ] Dermatitis   Codeine Nausea And Vomiting   Erythromycin  Nausea And Vomiting   Hydrocodone -Acetaminophen  Itching, Rash and Other (See Comments)    itching   Latex Itching and Other (See Comments)    Iif wear gloves hands get red ,itchy no problem if other wear and touch me   Neomycin Rash and Other (See Comments)    Redness   Nickel Itching   Ranitidine Nausea Only   Tamiflu [Oseltamivir] Nausea And Vomiting   Tramadol Nausea Only    Family History  Problem Relation Age of Onset   Heart attack Mother        d.61   Heart disease Mother    Anal fissures Mother    Heart attack Father        d.60   Diabetes Father    Heart disease Father    Lung cancer Sister        d.61 metastases to brain. History of smoking. Maternal half-sister.   Cancer Sister 30       unspecified gynecologic cancer (either cervical or ovarian) in her 30s.   Breast cancer Sister 17       d.42 from metastatic disease. Paternal half-sister.   Hypertension Brother    Glaucoma Brother    Parkinson's disease Paternal Aunt    Heart attack Maternal Grandmother    Cancer Maternal Grandfather 50       d.52s oral cancer   Colon cancer Neg Hx    Stomach cancer Neg Hx    Parkinsonism Neg Hx     Prior  to Admission medications   Medication Sig Start Date End Date Taking? Authorizing Provider  ALPRAZolam  (XANAX ) 0.5 MG tablet Take 1 tablet (0.5 mg total) by mouth 2 (two) times daily as needed for anxiety. Patient taking differently: Take 0.5 mg by mouth at bedtime. 01/15/18  Yes Rolinda Millman, MD  apixaban  (ELIQUIS ) 5 MG TABS tablet Take 1 tablet (5 mg total) by mouth 2 (two) times daily. 07/12/23  Yes Rolan Ezra RAMAN, MD  benzonatate  (TESSALON ) 200 MG  capsule Take 1 capsule (200 mg total) by mouth 3 (three) times daily. 08/01/23  Yes Ghimire, Donalda HERO, MD  Coenzyme Q10 (CO Q 10) 100 MG CAPS Take 100 mg by mouth at bedtime.    Yes [provider]  denosumab  (PROLIA ) 60 MG/ML SOSY injection Inject 60 mg into the skin every 6 (six) months.   Yes [provider]  dextromethorphan  (DELSYM ) 30 MG/5ML liquid Take 5 mLs (30 mg total) by mouth at bedtime. 08/01/23  Yes Ghimire, Donalda HERO, MD  escitalopram  (LEXAPRO ) 5 MG tablet Take 5 mg by mouth at bedtime.   Yes [provider]  gabapentin  (NEURONTIN ) 100 MG capsule Take 100 mg by mouth daily.   Yes [provider]  glucagon  (GLUCAGON  EMERGENCY) 1 MG injection Inject 1 mg into the vein once as needed (low blood sugar).   Yes [provider]  insulin  glargine (LANTUS ) 100 UNIT/ML Solostar Pen Inject 6 Units into the skin daily. 08/02/23  Yes Ghimire, Donalda HERO, MD  latanoprost  (XALATAN ) 0.005 % ophthalmic solution Place 1 drop into both eyes at bedtime.   Yes [provider]  metoprolol  succinate (TOPROL  XL) 25 MG 24 hr tablet Take 0.5 tablets (12.5 mg total) by mouth daily. 05/22/23  Yes Rolan Ezra RAMAN, MD  midodrine  (PROAMATINE ) 5 MG tablet Take 1 tablet (5 mg total) by mouth daily as needed (Prior to HD). 05/08/23  Yes Rolan Ezra RAMAN, MD  mometasone  (NASONEX ) 50 MCG/ACT nasal spray Place 1 spray into the nose daily as needed (allergies). 03/10/20  Yes [provider]  nitroGLYCERIN  (NITROLINGUAL ) 0.4 MG/SPRAY spray PLACE 1 SPRAY UNDER TH TONGUE EVERY 5 MINUTES FOR 3 DOSES AS NEEDED FOR CHEST PAIN 12/25/22  Yes Rolan Ezra RAMAN, MD  polyethylene glycol (MIRALAX  / GLYCOLAX ) 17 g packet Take 17 g by mouth daily as needed for mild constipation.   Yes [provider]  REPATHA  SURECLICK 140 MG/ML SOAJ INJECT 140MG  INTO THE SKIN EVERY 14 DAYS 03/29/23  Yes Rolan Ezra RAMAN, MD  torsemide  (DEMADEX ) 20 MG tablet Take 20 mg by mouth 3 (three) times  a week. Wednesday, Saturday, Sunday   Yes [provider]  VASCEPA  1 g capsule TAKE TWO CAPSULES (2 GRAMS TOTAL) BY MOUTH TWO TIMES DAILY 02/26/23  Yes McLean, [name] S, MD  APIDRA  100 UNIT/ML injection Inject 40 Units into the skin as directed. INFUSE THROUGH INSULIN  PUMP UTD, Bolus depends on carb intake 02/04/15   [provider]  Insulin  Pen Needle 32G X 4 MM MISC Use as directed with insulin  pens 08/01/23   Raenelle Donalda HERO, MD     Vitals:   08/04/23 2100 08/04/23 2130 08/04/23 2200 08/04/23 2245  BP: (!) 126/55 (!) 154/60 (!) 118/98 (!) 119/48  Pulse: (!) 115 (!) 118 (!) 117 (!) 106  Resp: 14 20 19 20   Temp:   98.4 F (36.9 C)   TempSrc:      SpO2: 97% 96% 91% 94%  Weight:  Height:       Physical Exam Constitutional:      General: She is not in acute distress.    Appearance: She is ill-appearing.  HENT:     Head: Normocephalic.      Comments: Bruising on left periorbital area from previous o2 mask .    Right Ear: Hearing and external ear normal.     Left Ear: Hearing, ear canal and external ear normal. A middle ear effusion is present.     Ears:     Comments: Purulent otitis media.     Nose: Nose normal.     Mouth/Throat:     Mouth: Mucous membranes are dry.  Eyes:     Extraocular Movements: Extraocular movements intact.     Pupils: Pupils are equal, round, and reactive to light.  Cardiovascular:     Rate and Rhythm: Normal rate and regular rhythm.     Pulses: Normal pulses.     Heart sounds: Murmur heard.  Pulmonary:     Effort: Pulmonary effort is normal.     Breath sounds: Normal breath sounds.  Abdominal:     General: There is distension.     Tenderness: There is no abdominal tenderness. There is no guarding.  Musculoskeletal:     Right lower leg: No edema.     Left lower leg: No edema.  Neurological:     General: No focal deficit present.     Mental Status: She is alert and oriented to person, place, and time.     Cranial Nerves: No  cranial nerve deficit.     Motor: Weakness present.     Gait: Gait normal.  Psychiatric:        Attention and Perception: Attention normal.        Mood and Affect: Mood normal.        Speech: Speech normal.        Behavior: Behavior normal.    Labs on Admission: I have personally reviewed following labs and imaging studies Results for orders placed or performed during the hospital encounter of 08/04/23 (from the past 24 hours)  Lactic acid, plasma     Status: None   Collection Time: 08/04/23  4:00 PM  Result Value Ref Range   Lactic Acid, Venous 0.9 0.5 - 1.9 mmol/L  Comprehensive metabolic panel     Status: Abnormal   Collection Time: 08/04/23  4:00 PM  Result Value Ref Range   Sodium 128 (L) 135 - 145 mmol/L   Potassium 3.8 3.5 - 5.1 mmol/L   Chloride 93 (L) 98 - 111 mmol/L   CO2 24 22 - 32 mmol/L   Glucose, Bld 202 (H) 70 - 99 mg/dL   BUN 85 (H) 8 - 23 mg/dL   Creatinine, Ser 7.06 (H) 0.44 - 1.00 mg/dL   Calcium  9.6 8.9 - 10.3 mg/dL   Total Protein 7.0 6.5 - 8.1 g/dL   Albumin  3.0 (L) 3.5 - 5.0 g/dL   AST 24 15 - 41 U/L   ALT 16 0 - 44 U/L   Alkaline Phosphatase 241 (H) 38 - 126 U/L   Total Bilirubin 2.0 (H) 0.0 - 1.2 mg/dL   GFR, Estimated 17 (L) >60 mL/min   Anion gap 11 5 - 15  CBC with Differential     Status: Abnormal   Collection Time: 08/04/23  4:00 PM  Result Value Ref Range   WBC 10.6 (H) 4.0 - 10.5 K/uL   RBC 3.07 (L) 3.87 - 5.11  MIL/uL   Hemoglobin 9.5 (L) 12.0 - 15.0 g/dL   HCT 71.9 (L) 63.9 - 53.9 %   MCV 91.2 80.0 - 100.0 fL   MCH 30.9 26.0 - 34.0 pg   MCHC 33.9 30.0 - 36.0 g/dL   RDW 85.6 88.4 - 84.4 %   Platelets 186 150 - 400 K/uL   nRBC 0.0 0.0 - 0.2 %   Neutrophils Relative % 78 %   Neutro Abs 8.4 (H) 1.7 - 7.7 K/uL   Lymphocytes Relative 9 %   Lymphs Abs 1.0 0.7 - 4.0 K/uL   Monocytes Relative 11 %   Monocytes Absolute 1.1 (H) 0.1 - 1.0 K/uL   Eosinophils Relative 0 %   Eosinophils Absolute 0.0 0.0 - 0.5 K/uL   Basophils Relative 1 %    Basophils Absolute 0.1 0.0 - 0.1 K/uL   Immature Granulocytes 1 %   Abs Immature Granulocytes 0.06 0.00 - 0.07 K/uL  Protime-INR     Status: Abnormal   Collection Time: 08/04/23  4:00 PM  Result Value Ref Range   Prothrombin Time 39.1 (H) 11.4 - 15.2 seconds   INR 4.0 (H) 0.8 - 1.2  APTT     Status: Abnormal   Collection Time: 08/04/23  4:00 PM  Result Value Ref Range   aPTT 52 (H) 24 - 36 seconds  Blood Culture (routine x 2)     Status: None (Preliminary result)   Collection Time: 08/04/23  4:00 PM   Specimen: Blood  Result Value Ref Range   Specimen Description      LEFT ANTECUBITAL BOTTLES DRAWN AEROBIC AND ANAEROBIC   Special Requests      Blood Culture adequate volume Performed at [facility], [address] [place], [place] [address]    Culture PENDING    Report Status PENDING   Blood Culture (routine x 2)     Status: None (Preliminary result)   Collection Time: 08/04/23  4:00 PM   Specimen: Blood  Result Value Ref Range   Specimen Description BLOOD LEFT ARM BOTTLES DRAWN AEROBIC AND ANAEROBIC    Special Requests      Blood Culture adequate volume Performed at [facility], [address], [place], [place] [address]    Culture PENDING    Report Status PENDING   Blood gas, venous ([facility], [facility], [facility])     Status: Abnormal   Collection Time: 08/04/23  4:00 PM  Result Value Ref Range   pH, Ven 7.43 7.25 - 7.43   pCO2, Ven 41 (L) 44 - 60 mmHg   pO2, Ven 50 (H) 32 - 45 mmHg   Bicarbonate 27.2 20.0 - 28.0 mmol/L   Acid-Base Excess 2.4 (H) 0.0 - 2.0 mmol/L   O2 Saturation 84 %   Patient temperature 36.5    Collection site LEFT ANTECUBITAL    Drawn by [id number]   Lactic acid, plasma     Status: None   Collection Time: 08/04/23  7:06 PM  Result Value Ref Range   Lactic Acid, Venous 1.0 0.5 - 1.9 mmol/L  Ammonia     Status: None   Collection Time: 08/04/23  9:48 PM  Result Value Ref Range   Ammonia 35 9 - 35 umol/L  CK     Status: Abnormal   Collection Time: 08/04/23  9:48 PM   Result Value Ref Range   Total CK 18 (L) 38 - 234 U/L  CBG monitoring, ED     Status: Abnormal   Collection Time: 08/04/23  11:05 PM  Result Value Ref Range   Glucose-Capillary 168 (H) 70 - 99 mg/dL   *Note: Due to a large number of results and/or encounters for the requested time period, some results have not been displayed. A complete set of results can be found in Results Review.   Recent Results (from the past 720 hours)  Resp panel by RT-PCR (RSV, Flu A&B, Covid) Anterior Nasal Swab     Status: Abnormal   Collection Time: 07/24/23  9:55 PM   Specimen: Anterior Nasal Swab  Result Value Ref Range Status   SARS Coronavirus 2 by RT PCR NEGATIVE NEGATIVE Final    Comment: (NOTE) SARS-CoV-2 target nucleic acids are NOT DETECTED.  The SARS-CoV-2 RNA is generally detectable in upper respiratory specimens during the acute phase of infection. The lowest concentration of SARS-CoV-2 viral copies this assay can detect is 138 copies/mL. A negative result does not preclude SARS-Cov-2 infection and should not be used as the sole basis for treatment or other patient management decisions. A negative result may occur with  improper specimen collection/handling, submission of specimen other than nasopharyngeal swab, presence of viral mutation(s) within the areas targeted by this assay, and inadequate number of viral copies(<138 copies/mL). A negative result must be combined with clinical observations, patient history, and epidemiological information. The expected result is Negative.  Fact Sheet for Patients:  bloggercourse.com  Fact Sheet for Healthcare Providers:  seriousbroker.it  This test is no t yet approved or cleared by the [place]  FDA and  has been authorized for detection and/or diagnosis of SARS-CoV-2 by FDA under an Emergency Use Authorization (EUA). This EUA will remain  in effect (meaning this test can be used) for the  duration of the COVID-19 declaration under Section 564(b)(1) of the Act, 21 U.S.C.section 360bbb-3(b)(1), unless the authorization is terminated  or revoked sooner.       Influenza A by PCR POSITIVE (A) NEGATIVE Final   Influenza B by PCR NEGATIVE NEGATIVE Final    Comment: (NOTE) The Xpert Xpress SARS-CoV-2/FLU/RSV plus assay is intended as an aid in the diagnosis of influenza from Nasopharyngeal swab specimens and should not be used as a sole basis for treatment. Nasal washings and aspirates are unacceptable for Xpert Xpress SARS-CoV-2/FLU/RSV testing.  Fact Sheet for Patients: bloggercourse.com  Fact Sheet for Healthcare Providers: seriousbroker.it  This test is not yet approved or cleared by the [place]  FDA and has been authorized for detection and/or diagnosis of SARS-CoV-2 by FDA under an Emergency Use Authorization (EUA). This EUA will remain in effect (meaning this test can be used) for the duration of the COVID-19 declaration under Section 564(b)(1) of the Act, 21 U.S.C. section 360bbb-3(b)(1), unless the authorization is terminated or revoked.     Resp Syncytial Virus by PCR NEGATIVE NEGATIVE Final    Comment: (NOTE) Fact Sheet for Patients: bloggercourse.com  Fact Sheet for Healthcare Providers: seriousbroker.it  This test is not yet approved or cleared by the [place]  FDA and has been authorized for detection and/or diagnosis of SARS-CoV-2 by FDA under an Emergency Use Authorization (EUA). This EUA will remain in effect (meaning this test can be used) for the duration of the COVID-19 declaration under Section 564(b)(1) of the Act, 21 U.S.C. section 360bbb-3(b)(1), unless the authorization is terminated or revoked.  Performed at [facility], [address] [place], [place] [address]   Blood Culture (routine x 2)     Status: None   Collection Time:  07/25/23 12:36 AM  Specimen: BLOOD  Result Value Ref Range Status   Specimen Description BLOOD BLOOD LEFT ARM  Final   Special Requests   Final    BOTTLES DRAWN AEROBIC AND ANAEROBIC Blood Culture adequate volume   Culture   Final    NO GROWTH 5 DAYS Performed at [facility], [address] [place], [place] [address]    Report Status 07/30/2023 FINAL  Final  Blood Culture (routine x 2)     Status: None   Collection Time: 07/25/23 12:36 AM   Specimen: BLOOD  Result Value Ref Range Status   Specimen Description BLOOD BLOOD LEFT HAND  Final   Special Requests   Final    BOTTLES DRAWN AEROBIC AND ANAEROBIC Blood Culture adequate volume   Culture   Final    NO GROWTH 5 DAYS Performed at [facility], [address], [place], [place] [address]    Report Status 07/30/2023 FINAL  Final  Culture, body fluid w Gram Stain-bottle     Status: None   Collection Time: 07/27/23 12:12 PM   Specimen: Pleura  Result Value Ref Range Status   Specimen Description PLEURAL  Final   Special Requests NONE  Final   Culture   Final    NO GROWTH 5 DAYS Performed at [facility], [address], [place], [place] [address]    Report Status 08/01/2023 FINAL  Final  Gram stain     Status: None   Collection Time: 07/27/23 12:12 PM   Specimen: Pleura  Result Value Ref Range Status   Specimen Description PLEURAL  Final   Special Requests NONE  Final   Gram Stain   Final    NO WBC SEEN NO ORGANISMS SEEN Performed at [facility], [address] [place], [place] [address]    Report Status 07/27/2023 FINAL  Final  Blood Culture (routine x 2)     Status: None (Preliminary result)   Collection Time: 08/04/23  4:00 PM   Specimen: Blood  Result Value Ref Range Status   Specimen Description   Final    LEFT ANTECUBITAL BOTTLES DRAWN AEROBIC AND ANAEROBIC   Special Requests   Final    Blood Culture adequate volume Performed at [facility], [address] [place], [place] [address]     Culture PENDING  Incomplete   Report Status PENDING  Incomplete  Blood Culture (routine x 2)     Status: None (Preliminary result)   Collection Time: 08/04/23  4:00 PM   Specimen: Blood  Result Value Ref Range Status   Specimen Description BLOOD LEFT ARM BOTTLES DRAWN AEROBIC AND ANAEROBIC  Final   Special Requests   Final    Blood Culture adequate volume Performed at [facility], [address], [place], [place] [address]    Culture PENDING  Incomplete   Report Status PENDING  Incomplete   CBC:    Latest Ref Rng & Units 08/04/2023    4:00 PM 08/01/2023    8:29 AM 07/31/2023    4:54 AM  CBC  WBC 4.0 - 10.5 K/uL 10.6  10.9  10.0   Hemoglobin 12.0 - 15.0 g/dL 9.5  89.3  88.6   Hematocrit 36.0 - 46.0 % 28.0  30.9  33.2   Platelets 150 - 400 K/uL 186  148  128    Basic Metabolic Panel: Recent Labs  Lab 07/29/23 0444 07/30/23 0433 07/31/23 0454 08/01/23 0459 08/04/23 1600  NA 128* 130* 126* 129* 128*  K 4.0  3.7 3.8 3.8 3.8  CL 90* 92* 91* 93* 93*  CO2 24 23 22 24 24   GLUCOSE 172* 169* 174* 185* 202*  BUN 68* 28* 52* 37* 85*  CREATININE 3.73* 2.11* 3.52* 3.93* 2.93*  CALCIUM  9.3 9.5 9.2 9.6 9.6  PHOS 4.1 2.9 4.0 3.9  --    Creatinine: Lab Results  Component Value Date   CREATININE 2.93 (H) 08/04/2023   CREATININE 3.93 (H) 08/01/2023   CREATININE 3.52 (H) 07/31/2023   Liver Function Tests:    Latest Ref Rng & Units 08/04/2023    4:00 PM 08/01/2023    4:59 AM 07/31/2023    4:54 AM  Hepatic Function  Total Protein 6.5 - 8.1 g/dL 7.0     Albumin  3.5 - 5.0 g/dL 3.0  3.5  3.0   AST 15 - 41 U/L 24     ALT 0 - 44 U/L 16     Alk Phosphatase 38 - 126 U/L 241     Total Bilirubin 0.0 - 1.2 mg/dL 2.0      Coagulation Profile: Recent Labs  Lab 08/04/23 1600  INR 4.0*   Cardiac Enzymes: Recent Labs  Lab 08/04/23 2148  CKTOTAL 18*   BNP (last 3 results) No results for input(s): PROBNP in the last 8760 hours. HbA1C: No results for input(s): HGBA1C in the last 72  hours. Lipid Profile: No results for input(s): CHOL, HDL, LDLCALC, TRIG, CHOLHDL, LDLDIRECT in the last 72 hours.  Radiological Exams on Admission: CT Chest Wo Contrast Result Date: 08/04/2023 CLINICAL DATA:  Pneumonia, complication suspected, xray done Failure to thrive. EXAM: CT CHEST WITHOUT CONTRAST TECHNIQUE: Multidetector CT imaging of the chest was performed following the standard protocol without IV contrast. RADIATION DOSE REDUCTION: This exam was performed according to the departmental dose-optimization program which includes automated exposure control, adjustment of the mA and/or kV according to patient size and/or use of iterative reconstruction technique. COMPARISON:  Radiograph earlier today. CT 5 days ago 07/30/2023. Chest CT 08/10/2022 also reviewed FINDINGS: Cardiovascular: Dialysis catheter tip in the right atrium. The heart is normal in size. Dense calcification of native coronary arteries. Prosthetic aortic valve. Dense mitral annulus calcifications. Densely calcified thoracic aorta without aneurysm. Left-sided pacemaker with lead tips in the right atrium and ventricle. No significant pericardial effusion. Mediastinum/Nodes: Technically limited assessment in the absence of IV contrast and paucity of mediastinal fat. There is scattered small mediastinal lymph nodes, none of which are enlarged by size criteria. Hilar assessment is limited on this unenhanced exam. The esophagus is decompressed. Lungs/Pleura: Loculated right pleural effusion with fluid tracking into the minor fissure as well as anterior laterally, without significant interval change. There is also a small medial component at the right lung base associated compressive atelectasis throughout the right lung. Nodular density at the right lung apex measures 13 mm, without significant interval change from exam last year, likely scarring. Additional left apical pleuroparenchymal scarring is similar. There is overall  improvement in the multifocal ground-glass opacities in the left lung consistent with resolving pneumonia. Mild residual patchy and nodular areas of ground-glass persist. Greatest residual is in the left lower lobe measuring 11 mm series 3, image 71. In area of ill-defined increased density within the basilar right lower lobe is unchanged over the last 5 days. Right lower lobe mucoid impaction within many segmental bronchi. Upper Abdomen: Gallstones. Posterior left renal cyst is unchanged from 2023. No further follow-up imaging is recommended. Musculoskeletal: Stable over the last 5 days. Minor T7 superior  endplate compression deformity. Median sternotomy. IMPRESSION: 1. Moderate-sized loculated pleural effusion is unchanged over the last 5 days. There is adjacent compressive atelectasis throughout the right lung. 2. Improvement in patchy areas of ground-glass likely resolving pneumonia. Areas of ground-glass persist, recommend CT follow-up to resolution after course of treatment. CT in 3 months would be reasonable. 3. Persistent area of increased density at the right lung base. This may represent an area of chronic atelectasis/aspiration, however is mention previously, difficult to exclude neoplasm. Consider pulmonary follow-up and outpatient PET CT as clinically indicated. 4. Stable area of nodular thickening at the right lung apex, favored to be scarring. 5. Additional stable chronic findings as described. Aortic Atherosclerosis (ICD10-I70.0). Electronically Signed   By: Andrea Gasman M.D.   On: 08/04/2023 18:40   DG Chest Port 1 View Result Date: 08/04/2023 CLINICAL DATA:  Questionable sepsis - evaluate for abnormality Failure to thrive. EXAM: PORTABLE CHEST 1 VIEW COMPARISON:  Radiograph 07/29/2023, CT 07/30/2023 FINDINGS: Right-sided dialysis catheter remains in place. Left-sided pacemaker in place. Prior median sternotomy with prosthetic aortic valve. Loculated right pleural effusion with questionable  increase from prior exam. Associated opacity in the right hemithorax is without significant interval change. Areas of ground-glass consolidation in the left lung on prior CT are not well demonstrated by radiograph. No pneumothorax. No convincing pulmonary edema. IMPRESSION: 1. Stable radiographic appearance of the chest. 2. Loculated right pleural effusion and opacities in the right hemithorax are unchanged over the last 6 days. 3. Additional patchy airspace disease on prior CT have no definite radiographic correlate. Electronically Signed   By: Andrea Gasman M.D.   On: 08/04/2023 16:06    Data Reviewed: Relevant notes from primary care and specialist visits, past discharge summaries as available in EHR, including Care Everywhere. Prior diagnostic testing as pertinent to current admission diagnoses, Updated medications and problem lists for reconciliation ED course, including vitals, labs, imaging, treatment and response to treatment,Triage notes, nursing and pharmacy notes and ED provider's notes Notable results as noted in HPI.Discussed case with EDMD/ ED APP/ or Specialty MD on call and as needed.  Assessment and Plan: >> Lethargy/generalized weakness: Suspect due to multiple factors including her recent influenza A and pneumonia.  Will check CPK.  We will admit  and treat underlying cause.  Pt today also has same loculations on chest xray with improving PNA.  >>CAD: - HOLD  eliquis  2/2 to elevated INR / metoprolol .   >> PAF: -currently in sinus rhythm. - HOLD eliquis  2/2 to elevated INR / metoprolol .   >>DM I: DM is well controlled with last A1c of 6.7, Pump on hold will cont with glycemic protocol.  >>Liver cirrhosis 2/2 NASH: Elevated INR HOLD eliquis .  We will monitor for any bleeding. Restart HD / type and screen albumin / or FFP as needed.   >> ESRD on HD M/T/T/F  Nephrology consulted per EDMD.   >> Anemia with a current hemoglobin of 9.5: Chart review shows that patient  developed hematochezia and aspirin  was discontinued GI was consulted and was cleared to resume Eliquis . We will hold eliquis  until INR is baseline and pt is able to take po.  Hb trend:    Latest Ref Rng & Units 08/04/2023    4:00 PM 08/01/2023    8:29 AM 07/31/2023    4:54 AM  CBC  WBC 4.0 - 10.5 K/uL 10.6  10.9  10.0   Hemoglobin 12.0 - 15.0 g/dL 9.5  89.3  88.6   Hematocrit 36.0 - 46.0 %  28.0  30.9  33.2   Platelets 150 - 400 K/uL 186  148  128     >> Loculated right-sided pleural effusion: Similar to previous presentation where pt had thoracocentesis 1/4-consistent with exudate-cytology negative  Loculated right pleural effusion with fluid tracking into the minor fissure as well as anterior laterally, without significant interval change. We will start pt on doxycycline  as her PNA is almost resolved with loading dose of doxycycline  200 mg.   >> Left purulent otitis media: Doxycycline  100 mg bid.    DVT prophylaxis:  SCD's  Consults:  Nephrology  Advance Care Planning:    Code Status: Full Code   Family Communication:  Spouse  Disposition Plan:   TBD  Severity of Illness: The appropriate patient status for this patient is INPATIENT. Inpatient status is judged to be reasonable and necessary in order to provide the required intensity of service to ensure the patient's safety. The patient's presenting symptoms, physical exam findings, and initial radiographic and laboratory data in the context of their chronic comorbidities is felt to place them at high risk for further clinical deterioration. Furthermore, it is not anticipated that the patient will be medically stable for discharge from the hospital within 2 midnights of admission.   * I certify that at the point of admission it is my clinical judgment that the patient will require inpatient hospital care spanning beyond 2 midnights from the point of admission due to high intensity of service, high risk for further deterioration and  high frequency of surveillance required.*  Author: Mario LULLA Blanch, MD 08/04/2023 11:17 PM  For on call review [email or web address].   Unresulted Labs (From admission, onward)     Start     Ordered   08/05/23 0500  Comprehensive metabolic panel  Tomorrow morning,   R        08/04/23 2218   08/05/23 0500  CBC  Tomorrow morning,   R        08/04/23 2218   08/05/23 0500  Protime-INR  Tomorrow morning,   R        08/04/23 2218   08/04/23 2214  TSH  Once,   R        08/04/23 2218   08/04/23 2214  T4, free  Once,   R        08/04/23 2218            Orders Placed This Encounter  Procedures   Blood Culture (routine x 2)   DG Chest Port 1 View   CT Chest Wo Contrast   Lactic acid, plasma   Comprehensive metabolic panel   CBC with Differential   Protime-INR   APTT   Blood gas, venous ([facility], [facility], [facility])   Ammonia   CK   Comprehensive metabolic panel   CBC   TSH   T4, free   Protime-INR   Diet NPO time specified   Document height and weight   Assess and Document Glasgow Coma Scale   Document vital signs within 1-hour of fluid bolus completion.  Notify provider of abnormal vital signs despite fluid resuscitation.   Refer to Sidebar Report: Sepsis Bundle ED/IP   Notify provider for difficulties obtaining IV access   Initiate Carrier Fluid Protocol   Apply Diabetes Mellitus Care Plan   STAT CBG when hypoglycemia is suspected. If treated, recheck every 15 minutes after each treatment until CBG >/= 70 mg/dl   Refer to Hypoglycemia Protocol Sidebar Report for treatment of CBG < 70  mg/dl   No HS correction Insulin    Maintain IV access   Vital signs   Notify physician (specify)   Mobility Protocol: No Restrictions   Refer to Sidebar Report Refer to ICU, Med-Surg, Progressive, and Step-Down Mobility Protocol Sidebars   Daily weights   Intake and Output   Do not place and if present remove PureWick   Initiate Oral Care Protocol   Initiate Carrier Fluid Protocol   RN may order General  Admission PRN Orders utilizing General Admission PRN medications (through manage orders) for the following patient needs: allergy symptoms (Claritin ), cold sores (Carmex), cough (Robitussin DM), eye irritation (Liquifilm Tears), hemorrhoids (Tucks), indigestion (Maalox), minor skin irritation (Hydrocortisone Cream), muscle pain Lucienne Gay), nose irritation (saline nasal spray) and sore throat (Chloraseptic spray).   Cardiac Monitoring - Continuous Indefinite   Full code   Consult to nephrology   Consult to hospitalist   Pulse oximetry check with vital signs   Oxygen therapy Mode or (Route): Nasal cannula; Liters Per Minute: 2; Keep O2 saturation between: greater than 92 %   I-stat chem 8, ED   CBG monitoring, ED   ED EKG   EKG 12-Lead   Insert peripheral IV X 1   Admit to Inpatient (patient's expected length of stay will be greater than 2 midnights or inpatient only procedure)   Aspiration precautions   Fall precautions

## 2023-08-04 NOTE — Assessment & Plan Note (Signed)
 We will obtain a head ct noncontrast.  Ammonia pending.

## 2023-08-04 NOTE — ED Notes (Signed)
 Attempted to get urine, pt unable to go at this time.

## 2023-08-04 NOTE — ED Provider Notes (Signed)
 [place] EMERGENCY DEPARTMENT AT [facility] Provider Note   CSN: [id number] Arrival date & time: 08/04/23  1315     History  Chief Complaint  Patient presents with   Failure To Thrive    [name] [name] is a 73 y.o. 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  Patient has not been to dialysis since last Wednesday.  He states that she is supposed to begin dialysis 6 days a week once he is trained on her home dialysis.  He reports that she had a very complicated hospitalization.  She developed aspiration pneumonia after coughing while she had a BiPAP on and inhaling vomitus.  She is currently on 2 L.  She developed a loculated moderate right-sided pleural effusion which was aspirated.  She was admitted with flu A.  She was discharged on 08/01/2023.  Her husband states she was able to walk in his house and get in bed but has been too weak to hold herself up since that time.  She is also declined food and fluid for the past 2 days.  She was too weak to hold up her own body weight to get out of the house for dialysis and was thus brought in by her husband who states that she is really gone downhill since we got home.  Patient denies any current pain.  She has a dialysis catheter in the right subclavian region.  She is a developing fistula in the right medial elbow.  HPI     Home Medications Prior to Admission medications   Medication Sig Start Date End Date Taking? Authorizing Provider  ALPRAZolam  (XANAX ) 0.5 MG tablet Take 1 tablet (0.5 mg total) by mouth 2 (two) times daily as needed for anxiety. 01/15/18   Rolinda Millman, MD  APIDRA  100 UNIT/ML injection Inject 40 Units into the skin as directed. INFUSE THROUGH INSULIN  PUMP UTD, Bolus depends on  carb intake 02/04/15   [provider]  apixaban  (ELIQUIS ) 5 MG TABS tablet Take 1 tablet (5 mg total) by mouth 2 (two) times daily. 07/12/23   Rolan Ezra RAMAN, MD  benzonatate  (TESSALON ) 200 MG capsule Take 1 capsule (200 mg total) by mouth 3 (three) times daily. 08/01/23   Ghimire, Donalda HERO, MD  Coenzyme Q10 (CO Q 10) 100 MG CAPS Take 100 mg by mouth at bedtime.     [provider]  colchicine  0.6 MG tablet Take 0.6 mg by mouth daily as needed (gout flare).    [provider]  denosumab  (PROLIA ) 60 MG/ML SOSY injection Inject 60 mg into the skin every 6 (six) months.    [provider]  dextromethorphan  (DELSYM ) 30 MG/5ML liquid Take 5 mLs (30 mg total) by mouth at bedtime. 08/01/23   Ghimire, Donalda HERO, MD  escitalopram  (LEXAPRO ) 5 MG tablet Take 5 mg by mouth at bedtime.    [provider]  famotidine  (PEPCID ) 40 MG tablet Take 40 mg by mouth at bedtime.    [provider]  gabapentin  (NEURONTIN ) 100 MG capsule Take 100 mg by mouth daily.    [provider]  glucagon  (GLUCAGON  EMERGENCY) 1 MG injection Inject 1 mg into the vein once as needed (low blood sugar).    [provider]  insulin  glargine (  LANTUS ) 100 UNIT/ML Solostar Pen Inject 6 Units into the skin daily. 08/02/23   Ghimire, Donalda HERO, MD  Insulin  Pen Needle 32G X 4 MM MISC Use as directed with insulin  pens 08/01/23   Ghimire, Donalda HERO, MD  latanoprost  (XALATAN ) 0.005 % ophthalmic solution Place 1 drop into both eyes at bedtime.    [provider]  metoprolol  succinate (TOPROL  XL) 25 MG 24 hr tablet Take 0.5 tablets (12.5 mg total) by mouth daily. 05/22/23   Rolan Ezra RAMAN, MD  midodrine  (PROAMATINE ) 5 MG tablet Take 1 tablet (5 mg total) by mouth daily as needed (Prior to HD). 05/08/23   Rolan Ezra RAMAN, MD  mometasone  (NASONEX ) 50 MCG/ACT nasal spray Place 1 spray into the nose daily as needed (allergies). 03/10/20   [provider]  nitroGLYCERIN   (NITROLINGUAL ) 0.4 MG/SPRAY spray PLACE 1 SPRAY UNDER TH TONGUE EVERY 5 MINUTES FOR 3 DOSES AS NEEDED FOR CHEST PAIN 12/25/22   Rolan Ezra RAMAN, MD  polyethylene glycol (MIRALAX  / GLYCOLAX ) 17 g packet Take 17 g by mouth in the morning. Hold for loose stools    [provider]  potassium chloride  SA (KLOR-CON  M) 20 MEQ tablet Take 1 tablet (20 mEq total) by mouth 2 (two) times daily. 07/18/23   Raford Lenis, MD  REPATHA  SURECLICK 140 MG/ML SOAJ INJECT 140MG  INTO THE SKIN EVERY 14 DAYS 03/29/23   Rolan Ezra RAMAN, MD  ULORIC  40 MG tablet Take 1 tablet (40 mg total) by mouth at bedtime. 11/29/17   Rolinda Millman, MD  VASCEPA  1 g capsule TAKE TWO CAPSULES (2 GRAMS TOTAL) BY MOUTH TWO TIMES DAILY 02/26/23   Rolan Ezra RAMAN, MD      Allergies    Avandia [rosiglitazone maleate], Cephalexin, Clindamycin, Humalog [insulin  lispro], Lincomycin, Metformin, Novolog  [insulin  aspart (human analog)], Penicillins, Tizanidine, Adhesive [tape], Atorvastatin, Cholestyramine, Gentamycin [gentamicin ], Iodinated contrast media, Limonene, Prednisone , Ranexa  [ranolazine ], Rosuvastatin, Simvastatin , Zinc , Acetaminophen , Azelastine , Clopidogrel , Kerendia  [finerenone ], Lisinopril , Neosporin [bacitracin -polymyxin b ], Codeine, Erythromycin , Hydrocodone -acetaminophen , Latex, Neomycin, Nickel, Ranitidine, Tamiflu [oseltamivir], and Tramadol    Review of Systems   Review of Systems  Physical Exam Updated Vital Signs BP (!) 113/48   Pulse 99   Temp 97.7 F (36.5 C) (Oral)   Resp 20   Ht 5' 2 (1.575 m)   Wt 45.8 kg   SpO2 96%   BMI 18.47 kg/m  Physical Exam Vitals and nursing note reviewed.  Constitutional:      General: She is not in acute distress.    Appearance: She is underweight. She is ill-appearing. She is not diaphoretic.     Interventions: Nasal cannula in place.  HENT:     Head: Normocephalic and atraumatic.     Right Ear: External ear normal.     Left Ear: External ear normal.     Nose: Nose  normal.     Mouth/Throat:     Mouth: Mucous membranes are dry.  Eyes:     General: No scleral icterus.    Conjunctiva/sclera: Conjunctivae normal.  Cardiovascular:     Rate and Rhythm: Normal rate and regular rhythm.     Heart sounds: Normal heart sounds. No murmur heard.    No friction rub. No gallop.  Pulmonary:     Effort: Pulmonary effort is normal. No respiratory distress.     Breath sounds: Normal breath sounds.  Abdominal:     General: Bowel sounds are normal. There is no distension.     Palpations: Abdomen is soft. There is  no mass.     Tenderness: There is no abdominal tenderness. There is no guarding.     Comments: Edematous abdomen with pitting edema  Musculoskeletal:     Cervical back: Normal range of motion.     Right lower leg: No edema.     Left lower leg: No edema.  Skin:    General: Skin is warm and dry.  Neurological:     Mental Status: She is oriented to person, place, and time. She is lethargic.     GCS: GCS eye subscore is 3. GCS verbal subscore is 5. GCS motor subscore is 6.  Psychiatric:        Behavior: Behavior normal.     ED Results / Procedures / Treatments   Labs (all labs ordered are listed, but only abnormal results are displayed) Labs Reviewed - No data to display  EKG None  Radiology No results found.  Procedures Procedures    Medications Ordered in ED Medications - No data to display  ED Course/ Medical Decision Making/ A&P Clinical Course as of 08/06/23 1615  Sun Aug 04, 2023  1610 DG Chest [place] 1 View [AH]  1722 Sodium(!): 128 [AH]  1752 BUN(!): 85 [AH]  1905  Case discussed with Dr. Geralynn.  Patient will be set up for dialysis tomorrow. [AH]  2105 SpO2(!): 88 % Patient's oxygen was turned off after transport and at nursing change the new nurse was unaware.  It is now turned back on.  Patient's oxygen saturations are improving. [AH]  2106 Total Bilirubin(!): 2.0 [AH]  2106 Alkaline Phosphatase(!): 241 [AH]    Clinical  Course User Index [AH] Arloa Chroman, PA-C                                 Medical Decision Making This patient presents to the ED for concern of fatigue and weakness, this involves an extensive number of treatment options, and is a complaint that carries with it a high risk of complications and morbidity.   The differential diagnosis of weakness includes but is not limited to neurologic causes (GBS, myasthenia gravis, CVA, MS, ALS, transverse myelitis, spinal cord injury, CVA, botulism, ) and other causes: ACS, Arrhythmia, syncope, orthostatic hypotension, sepsis, hypoglycemia, electrolyte disturbance, hypothyroidism, respiratory failure, symptomatic anemia, dehydration, heat injury, polypharmacy, malignancy.    Co morbidities:       has a past medical history of Anxiety, Aortic stenosis, Arthritis, Breast cancer (HCC), Carcinoma, renal cell (HCC) (05/2007), Chronic diastolic CHF (congestive heart failure) (HCC), Colitis, ischemic (HCC) (2008), Complication of anesthesia, Coronary artery disease, Diabetes mellitus (03/2010), Duodenal ulcer, Dyspnea, Gastroparesis due to DM (HCC) (05/01/2012), Glaucoma (1998), Hodgkin's disease (HCC) (1991), Hyperlipidemia, Hyperplastic gastric polyp (06/23/2012), Hypertension, Iron  deficiency anemia, Migraines, NASH (nonalcoholic steatohepatitis) (1999), OSA treated with BiPAP, Osteoporosis, Pacemaker, PAF (paroxysmal atrial fibrillation) (HCC), PONV (postoperative nausea and vomiting), Small bowel obstruction (HCC), and Syncope.   Social Determinants of Health:       SDOH Screenings Food Insecurity: No Food Insecurity (08/06/2023) Housing: Low Risk  (08/06/2023) Transportation Needs: No Transportation Needs (08/06/2023) Utilities: Not At Risk (08/06/2023) Depression (PHQ2-9): Low Risk  (09/08/2018) Financial Resource Strain: Low Risk  (01/02/2023)     Received from [facility], [facility] Physical Activity: Insufficiently  Active (03/17/2021)     Received from [facility] Social Connections: Socially Isolated (08/06/2023) Stress: Stress Concern Present (03/17/2021)  Received from [facility], [facility] Tobacco Use: Low Risk  (08/04/2023)   Additional history:  {Additional history obtained from husband {External records from outside source obtained and reviewed including previous admissions  Lab Tests:  I Ordered, and personally interpreted labs.  The pertinent results include:  VBG shows normal pH, no significant hypercarbia to explain lethargy, PT/INR elevated with with INR of 4 point no, CMP shows elevated liver enzymes with alkaline phosphatase of 241, total bili of 2.0 suggesting decompensated liver disease, Sodium 128 with BUN of 85 creatinine of 2.93, K is 3.8 and within normal limits remainder of patient's labs are within normal limits and noncontributory.  Imaging Studies:  I ordered imaging studies including chest x-ray and CT chest I independently visualized and interpreted imaging which showed continued loculated pleural effusion, improving groundglass opacities I agree with the radiologist interpretation  Cardiac Monitoring/ECG:       The patient was maintained on a cardiac monitor.  I personally viewed and interpreted the cardiac monitored which showed an underlying rhythm of: Normal sinus rhythm  Medicines ordered and prescription drug management:  I ordered medication including Medications Intravenous Given 08/06/23 1349) ondansetron  (ZOFRAN ) injection 4 mg (4 mg Intravenous Stopping previously hung infusion 08/06/23 0318) for nausea Reevaluation of the patient after these medicines showed that the patient improved I have reviewed the patients home medicines and have made adjustments as needed  Test Considered:       CT head however patient is lethargic not altered  Critical Interventions:       Oxygen support,  antiemetics  Consultations Obtained: Dr. Geralynn with nephrology for nonemergent dialysis, Dr. Tobie for admission  Problem List / ED Course:       (R62.7) FTT (failure to thrive) in adult  (primary encounter diagnosis)   MDM: Patient here with failure to thrive, inability to get to dialysis due to significant weakness.  Patient will need admission, dialysis.   Dispostion:  After consideration of the diagnostic results and the patients response to treatment, I feel that the patent would benefit from admission.    Amount and/or Complexity of Data Reviewed Labs: ordered. Decision-making details documented in ED Course. Radiology: ordered. Decision-making details documented in ED Course.  Risk Prescription drug management. Decision regarding hospitalization.           Final Clinical Impression(s) / ED Diagnoses Final diagnoses:  None    Rx / DC Orders ED Discharge Orders     None         Arloa Chroman, PA-C 08/06/23 1624    Cleotilde Rogue, MD 08/06/23 2002

## 2023-08-05 DIAGNOSIS — R5383 Other fatigue: Secondary | ICD-10-CM | POA: Diagnosis not present

## 2023-08-05 LAB — GLUCOSE, CAPILLARY
Glucose-Capillary: 149 mg/dL — ABNORMAL HIGH (ref 70–99)
Glucose-Capillary: 180 mg/dL — ABNORMAL HIGH (ref 70–99)
Glucose-Capillary: 184 mg/dL — ABNORMAL HIGH (ref 70–99)
Glucose-Capillary: 186 mg/dL — ABNORMAL HIGH (ref 70–99)
Glucose-Capillary: 196 mg/dL — ABNORMAL HIGH (ref 70–99)
Glucose-Capillary: 198 mg/dL — ABNORMAL HIGH (ref 70–99)
Glucose-Capillary: 224 mg/dL — ABNORMAL HIGH (ref 70–99)

## 2023-08-05 LAB — COMPREHENSIVE METABOLIC PANEL
ALT: 14 U/L (ref 0–44)
AST: 21 U/L (ref 15–41)
Albumin: 2.8 g/dL — ABNORMAL LOW (ref 3.5–5.0)
Alkaline Phosphatase: 197 U/L — ABNORMAL HIGH (ref 38–126)
Anion gap: 14 (ref 5–15)
BUN: 91 mg/dL — ABNORMAL HIGH (ref 8–23)
CO2: 21 mmol/L — ABNORMAL LOW (ref 22–32)
Calcium: 9.4 mg/dL (ref 8.9–10.3)
Chloride: 95 mmol/L — ABNORMAL LOW (ref 98–111)
Creatinine, Ser: 2.79 mg/dL — ABNORMAL HIGH (ref 0.44–1.00)
GFR, Estimated: 18 mL/min — ABNORMAL LOW (ref >60)
Glucose, Bld: 193 mg/dL — ABNORMAL HIGH (ref 70–99)
Potassium: 4 mmol/L (ref 3.5–5.1)
Sodium: 130 mmol/L — ABNORMAL LOW (ref 135–145)
Total Bilirubin: 2.2 mg/dL — ABNORMAL HIGH (ref 0.0–1.2)
Total Protein: 6.5 g/dL (ref 6.5–8.1)

## 2023-08-05 LAB — MRSA NEXT GEN BY PCR, NASAL: MRSA by PCR Next Gen: NOT DETECTED

## 2023-08-05 LAB — CBC
HCT: 25.8 % — ABNORMAL LOW (ref 36.0–46.0)
Hemoglobin: 9.4 g/dL — ABNORMAL LOW (ref 12.0–15.0)
MCH: 33.7 pg (ref 26.0–34.0)
MCHC: 36.4 g/dL — ABNORMAL HIGH (ref 30.0–36.0)
MCV: 92.5 fL (ref 80.0–100.0)
Platelets: 209 10*3/uL (ref 150–400)
RBC: 2.79 MIL/uL — ABNORMAL LOW (ref 3.87–5.11)
RDW: 14.3 % (ref 11.5–15.5)
WBC: 12.3 10*3/uL — ABNORMAL HIGH (ref 4.0–10.5)
nRBC: 0 % (ref 0.0–0.2)

## 2023-08-05 LAB — T4, FREE: Free T4: 0.99 ng/dL (ref 0.61–1.12)

## 2023-08-05 LAB — PROTIME-INR
INR: 3.7 — ABNORMAL HIGH (ref 0.8–1.2)
Prothrombin Time: 36.7 s — ABNORMAL HIGH (ref 11.4–15.2)

## 2023-08-05 LAB — HEPATITIS B SURFACE ANTIGEN: Hepatitis B Surface Ag: NONREACTIVE

## 2023-08-05 MED ORDER — BENZONATATE 100 MG PO CAPS
200.0000 mg | ORAL_CAPSULE | Freq: Once | ORAL | Status: AC
  Administered 2023-08-05: 200 mg via ORAL
  Filled 2023-08-05 (×2): qty 2

## 2023-08-05 MED ORDER — PHENOL 1.4 % MT LIQD
1.0000 | OROMUCOSAL | Status: DC | PRN
  Filled 2023-08-05: qty 177

## 2023-08-05 MED ORDER — CHLORHEXIDINE GLUCONATE CLOTH 2 % EX PADS
6.0000 | MEDICATED_PAD | Freq: Every day | CUTANEOUS | Status: DC
  Administered 2023-08-05 – 2023-08-09 (×5): 6 via TOPICAL

## 2023-08-05 MED ORDER — INSULIN GLARGINE-YFGN 100 UNIT/ML ~~LOC~~ SOLN
6.0000 [IU] | Freq: Every day | SUBCUTANEOUS | Status: DC
  Filled 2023-08-05: qty 0.06

## 2023-08-05 MED ORDER — INSULIN GLARGINE-YFGN 100 UNIT/ML ~~LOC~~ SOLN
3.0000 [IU] | Freq: Every day | SUBCUTANEOUS | Status: DC
  Administered 2023-08-05 – 2023-08-09 (×4): 3 [IU] via SUBCUTANEOUS
  Filled 2023-08-05 (×5): qty 0.03

## 2023-08-05 MED ORDER — ORAL CARE MOUTH RINSE: 15.0000 mL | OROMUCOSAL | Status: DC | PRN

## 2023-08-05 MED ORDER — GUAIFENESIN-DM 100-10 MG/5ML PO SYRP
5.0000 mL | ORAL_SOLUTION | ORAL | Status: DC | PRN
  Administered 2023-08-05 – 2023-08-08 (×8): 5 mL via ORAL
  Filled 2023-08-05 (×8): qty 5

## 2023-08-05 MED ORDER — HEPARIN SODIUM (PORCINE) 1000 UNIT/ML DIALYSIS
1000.0000 [IU] | INTRAMUSCULAR | Status: DC | PRN
Start: 2023-08-05 — End: 2023-08-09
  Administered 2023-08-07: 3800 [IU]

## 2023-08-05 MED ORDER — INSULIN GLULISINE 100 UNIT/ML IJ SOLN
2.0000 [IU] | Freq: Three times a day (TID) | INTRAMUSCULAR | Status: DC
  Administered 2023-08-08 – 2023-08-09 (×4): 2 [IU] via SUBCUTANEOUS

## 2023-08-05 MED ORDER — CHLORHEXIDINE GLUCONATE CLOTH 2 % EX PADS
6.0000 | MEDICATED_PAD | Freq: Every day | CUTANEOUS | Status: DC
  Administered 2023-08-06 – 2023-08-09 (×3): 6 via TOPICAL

## 2023-08-05 MED ORDER — ASPIRIN 81 MG PO TBEC
81.0000 mg | DELAYED_RELEASE_TABLET | Freq: Every day | ORAL | Status: DC
Start: 2023-08-06 — End: 2023-08-09
  Administered 2023-08-06 – 2023-08-09 (×4): 81 mg via ORAL
  Filled 2023-08-05 (×4): qty 1

## 2023-08-05 MED ORDER — ALTEPLASE 2 MG IJ SOLR: 2.0000 mg | Freq: Once | INTRAMUSCULAR | Status: DC | PRN

## 2023-08-05 MED ORDER — METOPROLOL TARTRATE 25 MG PO TABS
12.5000 mg | ORAL_TABLET | Freq: Two times a day (BID) | ORAL | Status: DC
  Administered 2023-08-05 – 2023-08-09 (×6): 12.5 mg via ORAL
  Filled 2023-08-05 (×7): qty 1

## 2023-08-05 MED ORDER — PRAVASTATIN SODIUM 10 MG PO TABS
20.0000 mg | ORAL_TABLET | Freq: Every day | ORAL | Status: DC
  Administered 2023-08-05 – 2023-08-08 (×4): 20 mg via ORAL
  Filled 2023-08-05 (×5): qty 2

## 2023-08-05 MED ORDER — INSULIN GLULISINE 100 UNIT/ML IJ SOLN
0.0000 [IU] | Freq: Three times a day (TID) | INTRAMUSCULAR | Status: DC
  Administered 2023-08-05 (×2): 2 [IU] via SUBCUTANEOUS
  Administered 2023-08-06: 3 [IU] via SUBCUTANEOUS
  Administered 2023-08-06: 2 [IU] via SUBCUTANEOUS
  Administered 2023-08-06: 3 [IU] via SUBCUTANEOUS
  Administered 2023-08-07: 5 [IU] via SUBCUTANEOUS
  Administered 2023-08-08: 3 [IU] via SUBCUTANEOUS
  Administered 2023-08-08: 1 [IU] via SUBCUTANEOUS

## 2023-08-05 MED ORDER — INSULIN GLULISINE 100 UNIT/ML IJ SOLN
0.0000 [IU] | Freq: Every day | INTRAMUSCULAR | Status: DC
  Administered 2023-08-09: 2 [IU] via SUBCUTANEOUS

## 2023-08-05 NOTE — Inpatient Diabetes Management (Addendum)
 Inpatient Diabetes Program Recommendations  AACE/ADA: New Consensus Statement on Inpatient Glycemic Control   Target Ranges:  Prepandial:   less than 140 mg/dL      Peak postprandial:   less than 180 mg/dL (1-2 hours)      Critically ill patients:  140 - 180 mg/dL    Latest Reference Range & Units 08/05/23 00:23 08/05/23 01:54 08/05/23 06:08 08/05/23 07:48  Glucose-Capillary 70 - 99 mg/dL 815 (H) 813 (H) 803 (H) 224 (H)    Review of Glycemic Control  Diabetes history: DM1 (does NOT make any insulin ; requires basal, correction, and carb coverage insulin ) Outpatient Diabetes medications: Medtronic Insulin  Pump with Apidra , Guardian CGM;  Current orders for Inpatient glycemic control: None  Inpatient Diabetes Program Recommendations:    Insulin : Please consider ordering Semglee  6 units Q24H and Apidra  (patient allergic to Novolog  and Humalog; will need to bring home supply)  0-6 units Q4H.  NOTE: Patient is currently in ED and being admitted with lethargy, generalized weakness and has hx of DM1, ESRD on HD, and cirrhosis.  Patient was recently inpatient 07/25/23-08/01/23 and at discharge was told to stop insulin  pump until she follows up with Endocrinologist and to use Lantus  6 units daily and Apidra  insulin  for meal coverage and correction.  Inpatient diabetes coordinators spoke with patient on 07/25/23, 07/29/23, and 07/30/23 during prior admission and insulin  pump settings were noted to be Basal 12A     0.925 units/hr 5A       2.25 units/hr 7A       2.8 units/hr 1:30P  4.0 units/hr  7P       5.0 units/hr 9P       3.5 units/hr Total Basal: 70.75 units   Insulin  to Carb Ratio 12A  1:4 grams 11A   1:2.8 grams 4P     1:2 grams 9P     1:3.5 grams   Insulin  Sensitivity 12A 1:15 mg/dl 7A   1:5 mg/dl  [employer] to [place], RN regarding patient and to ask if patient had on insulin  pump. RN reports that patient stated that the doctor will not let her use her insulin  pump. Will plan to  talk with patient and her husband today.  Addendum 08/05/23@11 :25-Spoke with patient's husband [name] [name] over the phone. He wanted to be sure that hospital staff is aware that she has Type 1 DM and since she was discharged on 08/01/23, she was given Lantus  to take 6 units daily and to continue using the Apidra  for correction and meal coverage. [name] [name] states that he has not given patient any insulin  since she has been home. He states that glucose at home has ranged from 122-210 mg/dl since they have been home. He reports that patient has Apidra  in her room (in a zip lock bag inside the black bag on the couch). He states that the plan is for patient to use SQ insulin  regimen until she follows up with Endocrinology as she will need to have insulin  pump settings adjusted. Informed [name] [name] I would go to patient's room and ask her friend sitting with her [name] [name]) to get the Apidra  out of the bag so our pharmacy can label it if needed. Informed [name] [name] that our team will continue to follow along while inpatient. Informed him that patient now has Semglee  and Apidra  sliding scale and meal coverage ordered. [name] [name] states that patient has not been eating much at all since she got home on Saturday. [name] [name] appreciative of call and information discussed; no further  questions at this time. Went to patient's room and spoke with patient and [name] [name] at bedside. [name] [name] obtained the Apidra  from the bag on the couch; will give to Apidra  so they can re-label if needed so bedside nursing can use the home supply of Apidra  for correction and meal coverage. Informed patient about current insulin  orders. Patient has no questions at this time.   Thanks, [name] Gainer, RN, MSN, CDCES Diabetes Coordinator Inpatient Diabetes Program [phone] (Team Pager from 8am to 5pm)

## 2023-08-05 NOTE — Progress Notes (Signed)
 Dr.Courage sent message to inquiry if D5/NS should continue to run? Message seen no new orders thus far.

## 2023-08-05 NOTE — Evaluation (Signed)
 Physical Therapy Evaluation Patient Details Name: [name] [name] MRN: [id number] DOB: August 30, 1950 Today's Date: 08/05/2023  History of Present Illness  [name] [name] is a 73 y.o. 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 Pt has H/O is FTT. Pt was just d/c Wednesday. And since then she has been very weak. Pt lives with spouse, but he is not able to lift her and move her and take her to dialysis.  Since coming home patient has been lethargic and weak and decreased p.o. intake insulin  has been held due to that.  Spouse states she has had left ear pain and has had bruising from the face mask last time she had when admitted.   Clinical Impression  Patient was in bed at beginning of session and was agreeable to therapy. Patient needed mod/max assistance going from supine to sit with verbal/tactile cueing to utilize BUE for balance. Patient had a posterior lean while balancing EOB. Patient went from sit to stand with mod/max assist and use of RW. Was able to side step 3 steps but was limited due to BLE weakness, fatigue and pain. Skin integrity is fragile. Patient will benefit from continued skilled physical therapy in hospital and recommended venue below to increase strength, balance, endurance for safe ADLs and gait.        If plan is discharge home, recommend the following: A lot of help with walking and/or transfers;A lot of help with bathing/dressing/bathroom;Help with stairs or ramp for entrance;Assistance with cooking/housework   Can travel by private vehicle   No    Equipment Recommendations  Rolling walker (2 wheels)  Recommendations for Other Services       Functional Status Assessment Patient has had a recent decline in their functional status and demonstrates the ability to make significant improvements in function in a reasonable and predictable amount of time.     Precautions / Restrictions Precautions Precautions: Fall Restrictions Weight Bearing Restrictions Per Provider Order: No      Mobility  Bed Mobility Overal bed mobility: Needs Assistance Bed Mobility: Supine to Sit, Sit to Sidelying     Supine to sit: Mod assist, Max assist Sit to supine: Mod assist, Max assist Sit to sidelying: Mod assist, Max assist General bed mobility comments: in bed supine at start and end of session    Transfers Overall transfer level: Needs assistance   Transfers: Sit to/from Stand Sit to Stand: Max assist, Mod assist                Ambulation/Gait Ambulation/Gait assistance: Mod assist, Max assist Gait Distance (Feet): 3 Feet Assistive device: Rolling walker (2 wheels) Gait Pattern/deviations: Decreased step length - right, Decreased step length - left, Decreased stride length Gait velocity: slow     General Gait Details: limited to a few sides steps due to BLE weakness and fatigue  Stairs            Wheelchair Mobility     Tilt Bed    Modified Rankin (Stroke Patients Only)       Balance Overall balance  assessment: Needs assistance Sitting-balance support: Bilateral upper extremity supported, Feet supported Sitting balance-Leahy Scale: Poor Sitting balance - Comments: Fair/poor sitting at EOB   Standing balance support: Reliant on assistive device for balance, During functional activity, Bilateral upper extremity supported Standing balance-Leahy Scale: Poor Standing balance comment: using RW                             Pertinent Vitals/Pain Pain Assessment Pain Assessment: Faces Faces Pain Scale: Hurts even more Pain  Location: Arms, very fragile skin Pain Descriptors / Indicators: Grimacing, Guarding, Sore Pain Intervention(s): Limited activity within patient's tolerance, Monitored during session, Repositioned    Home Living Family/patient expects to be discharged to:: Private residence Living Arrangements: Spouse/significant other Available Help at Discharge: Family;Available 24 hours/day Type of Home: House Home Access: Stairs to enter Entrance Stairs-Rails: Can reach both Entrance Stairs-Number of Steps: 5 Alternate Level Stairs-Number of Steps: 13 Home Layout: Two level;Able to live on main level with bedroom/bathroom;Full bath on main level Home Equipment: Wheelchair - manual;Shower seat - built in;Rollator (4 wheels);Grab bars - tub/shower      Prior Function Prior Level of Function : Independent/Modified Independent;Driving;History of Falls (last six months)             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 ADLs Comments: Ind     Extremity/Trunk Assessment   Upper Extremity Assessment Upper Extremity Assessment: Generalized weakness    Lower Extremity Assessment Lower Extremity Assessment: Generalized weakness    Cervical / Trunk Assessment Cervical / Trunk Assessment: Normal  Communication   Communication Communication: No apparent difficulties Cueing Techniques: Verbal cues;Tactile cues  Cognition Arousal: Alert Behavior During Therapy: WFL for tasks assessed/performed Overall Cognitive Status: Within Functional Limits for tasks assessed                                          General Comments      Exercises     Assessment/Plan    PT Assessment Patient needs continued PT services  PT Problem List Decreased strength;Decreased activity tolerance;Decreased balance;Decreased mobility;Pain       PT Treatment Interventions DME instruction;Gait training;Stair training;Functional mobility training;Therapeutic activities;Therapeutic  exercise;Balance training;Patient/family education    PT Goals (Current goals can be found in the Care Plan section)  Acute Rehab PT Goals Patient Stated Goal: return home PT Goal Formulation: With patient/family Time For Goal Achievement: 08/19/23 Potential to Achieve Goals: Good    Frequency Min 3X/week     Co-evaluation               AM-PAC PT 6 Clicks Mobility  Outcome Measure Help needed turning from your back to your side while in a flat bed without using bedrails?: A Lot Help needed moving from lying on your back to sitting on the side of a flat bed without using bedrails?: A Lot Help needed moving to and from a bed to a chair (including a wheelchair)?: A Lot Help needed standing up from a chair using your arms (e.g., wheelchair or bedside chair)?: A Lot Help needed to walk in hospital room?: A Lot Help needed climbing 3-5 steps with a railing? : Total 6 Click Score: 11    End of Session   Activity Tolerance: Patient tolerated treatment well;Patient limited by fatigue;Patient limited by pain Patient left: in bed;with call bell/phone within reach;with  family/visitor present Nurse Communication: Mobility status PT Visit Diagnosis: Unsteadiness on feet (R26.81);Muscle weakness (generalized) (M62.81);Other abnormalities of gait and mobility (R26.89);History of falling (Z91.81)    Time: 8489-8467 PT Time Calculation (min) (ACUTE ONLY): 22 min   Charges:   PT Evaluation $PT Eval Moderate Complexity: 1 Mod PT Treatments $Therapeutic Activity: 8-22 mins PT General Charges $$ ACUTE PT VISIT: 1 Visit         Magaby Rumberger SPT

## 2023-08-05 NOTE — Progress Notes (Signed)
 Drue Second and Rickey Barbara verified Apidra insulin. Per Viviann Spare medication does not need to be labled. Paperwork with medication from [facility] in [place].

## 2023-08-05 NOTE — Plan of Care (Signed)
  Problem: Fluid Volume: Goal: Ability to maintain a balanced intake and output will improve Outcome: Progressing   Problem: Education: Goal: Knowledge of General Education information will improve Description: Including pain rating scale, medication(s)/side effects and non-pharmacologic comfort measures Outcome: Progressing   Problem: Clinical Measurements: Goal: Will remain free from infection Outcome: Progressing

## 2023-08-05 NOTE — Progress Notes (Signed)
 Dr.Courage, Adrien Gainer and Elspeth Sour aware that pt.does not wear insulin  pump at this time and of allergies to some  insulin . Pt's husband brought one of her home insulins.  Adrien notified that pt's husband would like to speak to her regarding plan for insulin  and  Dr.Courage notified that he would like to speak to Dr.Courage again. (Pt's husband does not want pt.to go on bipap due to concern that she is unable to remove it herself and aspirated recently while on bipap)

## 2023-08-05 NOTE — TOC Initial Note (Signed)
 Transition of Care [facility]) - Initial/Assessment Note    Patient Details  Name: [name] [name] MRN: [id number] Date of Birth: 22-Feb-1951  Transition of Care [facility]) CM/SW Contact:    Rollo Petri, LCSW Phone Number: 08/05/2023, 1:01 PM  Clinical Narrative:                  Pt admitted from home. She was [facility] PT/OT from [facility] set up.  Pt sleeping at time of TOC attempted visit today. Spoke with pt's husband, Wadie, to complete assessment. Per Wadie, [facility] therapy has not yet started. He informs TOC that he and pt have recently started training at [facility] in [place] for Home HD training. Per Wadie, this is a 4-5 week training and they only completed 2 days of this training before the hospitalization at [facility].   Wadie states that pt has cane, walker, w/c, BSC for DME. He states they have a walk in shower and an adjustable bed as well.   Wadie is hopeful for return home with HH at dc. He states that depending on pt's status closer to dc, if she needs SNF rehab, they will consider that.  TOC will follow and continue to assess and assist with dc planning.  Expected Discharge Plan: Home w Home Health Services Barriers to Discharge: Continued Medical Work up   Patient Goals and CMS Choice Patient states their goals for this hospitalization and ongoing recovery are:: return home CMS Medicare.gov Compare Post Acute Care list provided to:: Patient Represenative (must comment) Choice offered to / list presented to : Spouse      Expected Discharge Plan and Services     Post Acute Care Choice: Resumption of Svcs/PTA Provider Living arrangements for the past 2 months: Single Family Home                                      Prior Living Arrangements/Services Living arrangements for the past 2 months: Single Family Home Lives with:: Spouse Patient language and need for interpreter reviewed:: Yes Do you feel safe going back to the place  where you live?: Yes      Need for Family Participation in Patient Care: Yes (Comment) Care giver support system in place?: Yes (comment) Current home services: DME, Home OT, Home PT Criminal Activity/Legal Involvement Pertinent to Current Situation/Hospitalization: No - Comment as needed  Activities of Daily Living      Permission Sought/Granted                  Emotional Assessment Appearance:: Appears stated age     Orientation: : Oriented to Self, Oriented to Place, Oriented to Situation, Oriented to  Time Alcohol / Substance Use: Not Applicable Psych Involvement: No (comment)  Admission diagnosis:  Lethargy [R53.83] FTT (failure to thrive) in adult [R62.7] Patient Active Problem List   Diagnosis Date Noted   ESRD on hemodialysis (HCC) 08/04/2023   Influenza A 07/25/2023   Weakness 07/25/2023   Tremor, essential 02/08/2023   Central sleep apnea 11/19/2022   Other restrictive cardiomyopathy (HCC) 08/21/2022   RLQ abdominal pain    Obstipation    Elevated alkaline phosphatase level    Protein-calorie malnutrition, severe 04/19/2022   Mesenteric adenitis 04/18/2022   CKD (chronic kidney disease) stage 4, GFR 15-29 ml/min (HCC) 04/18/2022   Multiple drug allergies 04/18/2022   Pain in right hip 12/11/2021  Lung nodule 04/16/2020   Pleural effusion 04/16/2020   Deviated septum 08/14/2019   Epistaxis, recurrent 08/14/2019   Nasal turbinate hypertrophy 08/14/2019   Traumatic hematoma of right lower leg 06/12/2019   Right hip pain 04/14/2019   Obstructive sleep apnea treated with BiPAP 04/24/2017   Genetic testing 01/14/2017   Insulin  pump status 09/12/2016   S/P left mastectomy 07/29/2016   History of breast cancer 03/29/2016   Dense breast 12/11/2015   Chronic anticoagulation 12/11/2015   Hx of radiation therapy 04/23/2015   Thrombocytopenia (HCC) 04/23/2015   Iron  deficiency anemia secondary to blood loss (chronic) 04/23/2015   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   Radiation therapy complication 10/27/2014   History of nodular sclerosis Hodgkin's disease 10/27/2014   Insulin  dependent type 1 diabetes mellitus (HCC) 10/07/2014   Claudication (HCC) 07/29/2014   PAF (paroxysmal atrial fibrillation) (HCC)    Coronary artery disease involving native coronary artery of native heart with unstable angina pectoris (HCC)    Unstable angina (HCC) 04/11/2013   Chronic diastolic CHF (congestive heart failure) (HCC) 04/03/2013   Artificial cardiac pacemaker 03/12/2013   Big thyroid  03/12/2013   Encounter for fitting or adjustment of insulin  pump 03/12/2013   Congestive heart failure (HCC) 03/12/2013   Dyslipidemia 03/12/2013   Liver cirrhosis secondary to NASH (HCC) 03/12/2013   Lipoma of neck 09/23/2012   Pacemaker 09/08/2012   Atrial flutter (HCC) 07/23/2012   Sick sinus syndrome (HCC) 07/21/2012   Gastroparesis due to DM (HCC) 05/01/2012   Hypercalcemia    Lethargy 08/13/2011   Calculus of gallbladder without cholecystitis without obstruction 07/20/2011   Aortic stenosis status post 19 mm Edwards pericardial valve replacement 2011    Arteriosclerotic cardiovascular disease (ASCVD)    Osteopenia    NASH (nonalcoholic steatohepatitis)    Renal cell carcinoma (HCC)     Class: History of   Duodenal ulcer     Class: History of   Ischemic colitis     Class: History of   Gastroesophageal reflux disease    Hypertension    Small bowel obstruction (HCC)    Hyperlipidemia 04/11/2010   Colonic polyps, adenomatous 09/29/2009   PCP:  Rolinda Millman, MD Pharmacy:   [facility] - [place], [place] - [address] [place] [address] Phone: [phone] Fax: [phone]  [facility] [phone] - [place], [place] - [address] AT [facility] [address] [place] [address] Phone: [phone] Fax: [phone]  [facility] [address] [place] [address] Phone: [phone] Fax: [phone]     Social Drivers of Health (SDOH) Social History: SDOH Screenings   Food Insecurity: No Food Insecurity (07/26/2023)  Housing: Low Risk  (07/26/2023)  Transportation Needs: No Transportation Needs (07/26/2023)  Utilities: Not At Risk (07/26/2023)  Depression (PHQ2-9): Low Risk  (09/08/2018)  Financial Resource Strain: Low Risk  (01/02/2023)   Received from [facility], [facility]  Physical Activity: Insufficiently Active (03/17/2021)   Received from [facility]  Social Connections: Socially Isolated (07/26/2023)  Stress: Stress Concern Present (03/17/2021)   Received from [facility], [facility]  Tobacco Use: Low Risk  (08/04/2023)   SDOH Interventions:     Readmission Risk Interventions    08/05/2023    1:00 PM  Readmission Risk Prevention Plan  Transportation Screening Complete  HRI or Home Care Consult Complete  Social Work Consult for Recovery Care Planning/Counseling Complete  Palliative Care Screening Complete  Medication Review Oceanographer) Complete

## 2023-08-05 NOTE — Plan of Care (Addendum)
  Problem: Education: Goal: Ability to describe self-care measures that may prevent or decrease complications (Diabetes Survival Skills Education) will improve Outcome: Progressing Goal: Individualized Educational Video(s) Outcome: Progressing   Problem: Coping: Goal: Ability to adjust to condition or change in health will improve Outcome: Progressing   Problem: Fluid Volume: Goal: Ability to maintain a balanced intake and output will improve Outcome: Progressing   Problem: Health Behavior/Discharge Planning: Goal: Ability to identify and utilize available resources and services will improve Outcome: Progressing Goal: Ability to manage health-related needs will improve Outcome: Progressing   Problem: Metabolic: Goal: Ability to maintain appropriate glucose levels will improve Outcome: Progressing   Problem: Nutritional: Goal: Maintenance of adequate nutrition will improve Outcome: Progressing Goal: Progress toward achieving an optimal weight will improve Outcome: Progressing   Problem: Skin Integrity: Goal: Risk for impaired skin integrity will decrease Outcome: Progressing   Problem: Tissue Perfusion: Goal: Adequacy of tissue perfusion will improve Outcome: Progressing   Problem: Education: Goal: Knowledge of General Education information will improve Description: Including pain rating scale, medication(s)/side effects and non-pharmacologic comfort measures Outcome: Progressing   Problem: Health Behavior/Discharge Planning: Goal: Ability to manage health-related needs will improve Outcome: Progressing   Problem: Clinical Measurements: Goal: Ability to maintain clinical measurements within normal limits will improve Outcome: Progressing Goal: Will remain free from infection Outcome: Progressing Goal: Diagnostic test results will improve Outcome: Progressing Goal: Respiratory complications will improve Outcome: Progressing Goal: Cardiovascular complication will  be avoided Outcome: Progressing   Problem: Activity: Goal: Risk for activity intolerance will decrease Outcome: Progressing   Problem: Nutrition: Goal: Adequate nutrition will be maintained Outcome: Progressing   Problem: Coping: Goal: Level of anxiety will decrease Outcome: Progressing   Problem: Elimination: Goal: Will not experience complications related to bowel motility Outcome: Progressing Goal: Will not experience complications related to urinary retention Outcome: Progressing   Problem: Pain Management: Goal: General experience of comfort will improve Outcome: Progressing   Problem: Safety: Goal: Ability to remain free from injury will improve Outcome: Progressing   Problem: Skin Integrity: Goal: Risk for impaired skin integrity will decrease Outcome: Progressing   Problem: Acute Rehab PT Goals(only PT should resolve) Goal: Pt Will Go Supine/Side To Sit Outcome: Progressing Flowsheets (Taken 08/05/2023 1609) Pt will go Supine/Side to Sit: with minimal assist Goal: Patient Will Transfer Sit To/From Stand Outcome: Progressing Flowsheets (Taken 08/05/2023 1609) Patient will transfer sit to/from stand:  with minimal assist  with moderate assist Goal: Pt Will Transfer Bed To Chair/Chair To Bed Outcome: Progressing Flowsheets (Taken 08/05/2023 1609) Pt will Transfer Bed to Chair/Chair to Bed:  with min assist  with mod assist Goal: Pt Will Ambulate Outcome: Progressing Flowsheets (Taken 08/05/2023 1609) Pt will Ambulate:  25 feet  with moderate assist  with rolling walker   Mackenzie burns SPT  4:15 PM, 08/05/23 Lynwood Music, MPT Physical Therapist with [facility] [phone] office [phone] mobile phone

## 2023-08-05 NOTE — Consult Note (Signed)
 ESRD Consult Note  Reason for consult: ESRD, provision of dialysis  Assessment/Recommendations:  ESRD -outpatient HD orders: [facility]. 3 hours.  TDC.  Flow rates: 400/8.3.  EDW 52 kg (supposed to be 46kg per recent admit).  2K (changed to 3k on recent admit), lactate 45.  Heparin : None.  Meds: Mircera 75 mcg every 4 weeks. -Will keep on MWF schedule for HD while admitted, HD today  Lethargy/generalized weakness -per primary service  Volume/ hypertension  -will keep EDW at 46kg, will likely need further lowering of edw, will UF as tolerated  Anemia of Chronic Kidney Disease -Hemoglobin 9.4. Will check iron  panel -Transfuse PRN for Hgb <7  Secondary Hyperparathyroidism/Hyperphosphatemia - check po4   Loculated right pleural effusion -per primary -receiving doxycycline   PAF -eliquis  on hold given elevated INR, per primary  DM1 -pump on hold, per primary  Discussed with husband at the bedside.  [name] Stank, MD [facility]  History of Present Illness: [name] [name] is a/an 73 y.o. female with a past medical history of ESRD, DM1, HTN, CAD s/p CABG x 2, s/p AVR, Afib, s/p PPM, h/o rt nephrectomy for RCC, NASH liver cirrhosis, h/o lymphoma, recent admit for flu who presents with weakness/tired, unable to go to outpatient HD. With FTT picture. Recently admitted for AMS, AHRF secondary to flu A and possible aspiration PNA, loculated right sided pleural effusion, hematochezia/epistaxis. Since discharge on 1/9, has not been able to go to dialysis. Patient seen and examined bedside. Feels weak. Has not had anything to eat but does want pears today. Husband at bedside. No other complaints currently.   Medications:  Current Facility-Administered Medications  Medication Dose Route Frequency Provider Last Rate Last Admin   0.9 %  sodium chloride  infusion  250 mL Intravenous PRN Patel, Ekta V, MD       Chlorhexidine  Gluconate Cloth 2 % PADS 6 each  6 each Topical  Daily Tobie Mario GAILS, MD       dextrose  5 %-0.9 % sodium chloride  infusion   Intravenous Continuous Tobie Mario GAILS, MD 10 mL/hr at 08/05/23 0020 New Bag at 08/05/23 0020   doxycycline  (VIBRAMYCIN ) 100 mg in dextrose  5 % 250 mL IVPB  100 mg Intravenous Q12H Tobie Mario GAILS, MD 125 mL/hr at 08/05/23 0117 100 mg at 08/05/23 0117   Oral care mouth rinse  15 mL Mouth Rinse PRN Tobie Mario GAILS, MD       sodium chloride  flush (NS) 0.9 % injection 3 mL  3 mL Intravenous Q12H Tobie Mario GAILS, MD   3 mL at 08/04/23 2238   sodium chloride  flush (NS) 0.9 % injection 3 mL  3 mL Intravenous Q12H Tobie Mario GAILS, MD   3 mL at 08/04/23 2238   sodium chloride  flush (NS) 0.9 % injection 3 mL  3 mL Intravenous PRN Tobie Mario GAILS, MD         ALLERGIES Avandia [rosiglitazone maleate], Cephalexin, Clindamycin, Humalog [insulin  lispro], Lincomycin, Metformin, Novolog  [insulin  aspart (human analog)], Penicillins, Tizanidine, Adhesive [tape], Atorvastatin, Cholestyramine, Gentamycin [gentamicin ], Iodinated contrast media, Limonene, Prednisone , Ranexa  [ranolazine ], Rosuvastatin, Simvastatin , Zinc , Acetaminophen , Azelastine , Clopidogrel , Kerendia  [finerenone ], Lisinopril , Neosporin [bacitracin -polymyxin b ], Codeine, Erythromycin , Hydrocodone -acetaminophen , Latex, Neomycin, Nickel, Ranitidine, Tamiflu [oseltamivir], and Tramadol  MEDICAL HISTORY Past Medical History:  Diagnosis Date   Anxiety    Aortic stenosis    a. bicuspid aortic valve; mean gradient of 20 mmHg in 2/10; b. s/p bioprosth AVR (19-mm Edwards pericardial valve) with a redo coronary artery bypass graft procedure  in 07/2009; postoperative Dressler's syndrome     Arthritis    Breast cancer (HCC)    Carcinoma, renal cell (HCC) 05/2007   Laparoscopic right nephrectomy   Chronic diastolic CHF (congestive heart failure) (HCC)    a. 9.2014 EchoP EF 60-65%, no rwma, bioprosth AVR, mean gradient of , mild to mod MR, PASP .   Colitis, ischemic (HCC) 2008    Complication of anesthesia    Coronary artery disease    a. initial CABG with SVG-RCA in 12/05. b. redo SVG-RCA and bioprosthetic AVR in 1/11. d. 9/14 - PCI with DES to distal LM/proximal LAD was done rather than bypass as she would have been a poor candidate for redo sternotomy. d. S/p DES to prox LM 05/2014.   Diabetes mellitus 03/2010    TYPE II: Hemoglobin A1c of 7.4 in  03/2010; 8.4 in 06/2010; treated with insulin  pump   Duodenal ulcer    Remote; H. pylori positive   Dyspnea    Gastroparesis due to DM (HCC) 05/01/2012   Glaucoma 1998   Hodgkin's disease (HCC) 1991   Mantle radiation therapy   Hyperlipidemia    Hyperplastic gastric polyp 06/23/2012   Hypertension    Iron  deficiency anemia    a. 03/2013 EGD: essentially normal. Possible slow GIB.   Migraines    NASH (nonalcoholic steatohepatitis) 1999   -biopsy in 1999   OSA treated with BiPAP    Osteoporosis    Pacemaker    six sinus syndrome   PAF (paroxysmal atrial fibrillation) (HCC)    a.  h/o brief episodes noted on ppm interrogation. b. given GI bleeding and need to be on both ASA 81 and Brilinta , risks likely outweigh benefits of anticoagulation.    PONV (postoperative nausea and vomiting)    Small bowel obstruction (HCC)    Recurrent; resolved after resection of a lipoma   Syncope    a. H/o recurrent syncope with pauses on loop recorder s/p Medtronic pacemaker 07/2012.     SOCIAL HISTORY Social History   Socioeconomic History   Marital status: Married    Spouse name: Not on file   Number of children: 2   Years of education: Not on file   Highest education level: Doctorate  Occupational History   Occupation: retired    Comment: elementary principal  Tobacco Use   Smoking status: Never   Smokeless tobacco: Never  Vaping Use   Vaping status: Never Used  Substance and Sexual Activity   Alcohol use: No    Comment: social, 70s   Drug use: Never   Sexual activity: Yes  Other Topics Concern   Not on file   Social History Narrative   Retired Programmer, Multimedia principal, married    Started disability September 2013      Has one daughter in [place], works at [employer]. Has a son, Redell, who is press photographer.   Eats all food groups.   Wear seatbelt.    Attends church. [place], [place]. [employer].    Social Drivers of Corporate Investment Banker Strain: Low Risk  (01/02/2023)   Received from [facility], [facility]   Overall Financial Resource Strain (CARDIA)    Difficulty of Paying Living Expenses: Not hard at all  Food Insecurity: No Food Insecurity (07/26/2023)   Hunger Vital Sign    Worried About Running Out of Food in the Last Year: Never true    Ran Out of Food in the Last Year: Never true  Transportation Needs: No Transportation Needs (07/26/2023)   PRAPARE - Administrator, Civil Service (Medical): No    Lack of Transportation (Non-Medical): No  Physical Activity: Insufficiently Active (03/17/2021)   Received from [facility]   Exercise Vital Sign  Stress: Stress Concern Present (03/17/2021)   Received from [facility], [facility]   [employer] of Occupational Health - Occupational Stress Questionnaire    Feeling of Stress : To some extent  Social Connections: Socially Isolated (07/26/2023)   Social Connection and Isolation Panel [NHANES]    Frequency of Communication with Friends and Family: Never    Frequency of Social Gatherings with Friends and Family: Never    Attends Religious Services: Never    Database Administrator or Organizations: No    Attends Banker Meetings: Never    Marital Status: Married  Catering Manager Violence: Not At Risk (07/26/2023)   Humiliation, Afraid, Rape, and Kick questionnaire    Fear of Current or Ex-Partner: No    Emotionally Abused: No    Physically Abused: No    Sexually Abused: No     FAMILY HISTORY Family History  Problem  Relation Age of Onset   Heart attack Mother        d.61   Heart disease Mother    Anal fissures Mother    Heart attack Father        d.60   Diabetes Father    Heart disease Father    Lung cancer Sister        d.61 metastases to brain. History of smoking. Maternal half-sister.   Cancer Sister 30       unspecified gynecologic cancer (either cervical or ovarian) in her 30s.   Breast cancer Sister 45       d.42 from metastatic disease. Paternal half-sister.   Hypertension Brother    Glaucoma Brother    Parkinson's disease Paternal Aunt    Heart attack Maternal Grandmother    Cancer Maternal Grandfather 50       d.52s oral cancer   Colon cancer Neg Hx    Stomach cancer Neg Hx    Parkinsonism Neg Hx      Review of Systems: 12 systems were reviewed and negative except per HPI  Physical Exam: Vitals:   08/05/23 0400 08/05/23 0500  BP: (!) 116/58 (!) 103/40  Pulse: (!) 106 (!) 103  Resp: 18 16  Temp:    SpO2: 100% 100%   No intake/output data recorded. No intake or output data in the 24 hours ending 08/05/23 0759 General: chronically ill appearing, NAD HEENT: anicteric sclera, MMM CV: normal rate, +systolic murmur, no edema Lungs: decreased breath sounds right>left base, normal wob Abd: soft, non-tender Skin: ecchymosis over left side of face Neuro: awake/alert, generalized weakness Dialysis access: right [facility] c/d/i  Test Results Reviewed Lab Results  Component Value Date   NA 130 (L) 08/05/2023   K 4.0 08/05/2023   CL 95 (L) 08/05/2023   CO2 21 (L) 08/05/2023   BUN 91 (H) 08/05/2023   CREATININE 2.79 (H) 08/05/2023   GFR 70.12 04/13/2015   CALCIUM  9.4 08/05/2023   ALBUMIN  2.8 (L) 08/05/2023   PHOS 3.9 08/01/2023    I have reviewed relevant outside healthcare records

## 2023-08-05 NOTE — Progress Notes (Addendum)
 PROGRESS NOTE  [name] [name], is a 73 y.o. female, DOB - [birth year], FMW:[id number]  Admit date - 08/04/2023   Admitting Physician Mario Tobie GAILS, MD  Outpatient Primary MD for the patient is Rolinda Millman, MD  LOS - 1  Chief Complaint  Patient presents with   Failure To Thrive      Brief Narrative:  -73 y.o. 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 admitted on 08/04/2023 with acute metabolic encephalopathy and generalized weakness and deconditioning    -Assessment and Plan: 1)acute metabolic encephalopathy and generalized weakness and deconditioning-- -multifactorial in the setting of influenza infection, NASH liver cirrhosis,  ESRD with missed hemodialysis sessions and OSA with chronic hypercapnia with intolerance of BiPAP/NIV --- Use BiPAP/NIV as tolerated -Nephrology consult for hemodialysis -Supportive care for influenza--patient did not tolerate Tamiflu  2)ESRD----M/Tu/Th/F schedule----as outpatient -last HD was 07/31/2023 -Patient usually gets hemodialysis at least 4 times a week as outpt -Nephrology consult requested for HD--nephrologist recommends MWF schedule while inpatient  3)Liver cirrhosis secondary to NASH (HCC) -Serum ammonia is not elevated -INR is 4.0 watch closely  4) chronic anemia of ESRD =-Hgb currently greater than 9 -No obvious bleeding at this time -Defer decision on ESA/Procrit  to nephrology team  5)PAFib/s/p PPM--Eliquis  on hold due to bleeding concerns in the setting of liver cirrhosis with elevated INR -- Metoprolol  for rate control  6)Chronic diastolic dysfunction CHF/HFpEF--- continue Toprol -XL -Use hemodialysis sessions to address volume status  7)CAD history of CABG, multiple PCI / HLD:  -Continue pravastatin  and aspirin   --Eliquis  on hold as  above #5 ---- Patient gets outpatient Repatha  and Vascepa   8)DM2-A1c 6.7 reflecting good diabetic control PTA --Hold PTA insulin  pump-- -Give Semglee  3 units nightly -Sliding scale with Apidra  as ordered -Diabetic educator input appreciated  History RCC, right nephrectomy: Outpatient surveillance Remote history lymphoma: outpatient surveillance H/o Breast Ca/DCIS--previously treated  Status is: Inpatient   Disposition: The patient is from: Home              Anticipated d/c is to: Home              Anticipated d/c date is: 3 days              Patient currently is not medically stable to d/c. Barriers: Not Clinically Stable-   Code Status :  -  Code Status: Full Code   Family Communication:   (patient is alert, awake and coherent)  Discussed with Husband at bedside  DVT Prophylaxis  :   - SCDs /subcu heparin  for DVT prophylaxis while Eliquis  is on hold   Lab Results  Component Value Date   PLT 209 08/05/2023    Inpatient Medications  Scheduled Meds:  Chlorhexidine  Gluconate Cloth  6 each Topical Daily   Chlorhexidine  Gluconate Cloth  6 each Topical Q0600   insulin  glargine-yfgn  6 Units Subcutaneous Daily   sodium chloride  flush  3 mL Intravenous Q12H   sodium chloride  flush  3 mL Intravenous Q12H   Continuous Infusions:  sodium chloride      dextrose  5 % and 0.9 % NaCl 10 mL/hr at 08/05/23 0020   doxycycline  (VIBRAMYCIN ) IV 100 mg (08/05/23 0117)   PRN Meds:.sodium chloride , mouth rinse, sodium chloride  flush   Anti-infectives (From admission, onward)    Start  Dose/Rate Route Frequency Ordered Stop   08/05/23 0000  doxycycline  (VIBRAMYCIN ) 100 mg in dextrose  5 % 250 mL IVPB        100 mg 125 mL/hr over 120 Minutes Intravenous Every 12 hours 08/04/23 2312     08/04/23 2315  doxycycline  (VIBRAMYCIN ) 200 mg in dextrose  5 % 250 mL IVPB  Status:  Discontinued        200 mg 125 mL/hr over 120 Minutes Intravenous  Once 08/04/23 2312 08/04/23 2314       Subjective: [name] [name] today has no fevers, no emesis,  No chest pain,   -Husband at bedside, questions answered -Appetite is poor  Objective: Vitals:   08/05/23 0814 08/05/23 0900 08/05/23 0902 08/05/23 1000  BP:  (!) 115/38  (!) 115/33  Pulse:  (!) 106 (!) 107 (!) 105  Resp:  (!) 21 (!) 23 17  Temp: 98.6 F (37 C)     TempSrc: Oral     SpO2:  97% 96% 93%  Weight:      Height:       No intake or output data in the 24 hours ending 08/05/23 1051 Filed Weights   08/04/23 1335 08/05/23 0030 08/05/23 0326  Weight: 45.8 kg 46.6 kg 46.6 kg    Physical Exam Gen:- Awake Alert, chronically ill-appearing  HEENT:- [place].AT, No sclera icterus, Rt internal jugu Neck-Supple Neck, Rt  IJ HD cath in situ Lungs-no wheezing, fair symmetrical air movement CV- S1, S2 normal, regular , prior CABG scar, sided pacemaker in situ Abd-  +ve B.Sounds, Abd Soft, No tenderness,    Extremity/Skin:- No  edema, pedal pulses present  Psych-affect is appropriate, oriented x3 Neuro-generalized weakness, no new focal deficits, no tremors MSK--right upper extremity AV fistula with thrill and bruit   Data Reviewed: I have personally reviewed following labs and imaging studies  CBC: Recent Labs  Lab 07/30/23 0433 07/30/23 1530 07/31/23 0454 08/01/23 0829 08/04/23 1600 08/05/23 0323  WBC 10.3 7.8 10.0 10.9* 10.6* 12.3*  NEUTROABS 7.9*  --  7.1  --  8.4*  --   HGB 12.4 11.4* 11.3* 10.6* 9.5* 9.4*  HCT 36.5 33.7* 33.2* 30.9* 28.0* 25.8*  MCV 91.3 91.6 91.2 90.1 91.2 92.5  PLT 130* 120* 128* 148* 186 209   Basic Metabolic Panel: Recent Labs  Lab 07/30/23 0433 07/31/23 0454 08/01/23 0459 08/04/23 1600 08/05/23 0323  NA 130* 126* 129* 128* 130*  K 3.7 3.8 3.8 3.8 4.0  CL 92* 91* 93* 93* 95*  CO2 23 22 24 24  21*  GLUCOSE 169* 174* 185* 202* 193*  BUN 28* 52* 37* 85* 91*  CREATININE 2.11* 3.52* 3.93* 2.93* 2.79*  CALCIUM  9.5 9.2 9.6 9.6 9.4  PHOS 2.9 4.0 3.9  --   --     GFR: Estimated Creatinine Clearance: 13.8 mL/min (A) (by C-G formula based on SCr of 2.79 mg/dL (H)). Liver Function Tests: Recent Labs  Lab 07/30/23 0433 07/31/23 0454 08/01/23 0459 08/04/23 1600 08/05/23 0323  AST  --   --   --  24 21  ALT  --   --   --  16 14  ALKPHOS  --   --   --  241* 197*  BILITOT  --   --   --  2.0* 2.2*  PROT  --   --   --  7.0 6.5  ALBUMIN  3.4* 3.0* 3.5 3.0* 2.8*   Cardiac Enzymes: Recent Labs  Lab 08/04/23 2148  CKTOTAL 18*   Recent  Results (from the past 240 hours)  Culture, body fluid w Gram Stain-bottle     Status: None   Collection Time: 07/27/23 12:12 PM   Specimen: Pleura  Result Value Ref Range Status   Specimen Description PLEURAL  Final   Special Requests NONE  Final   Culture   Final    NO GROWTH 5 DAYS Performed at [facility], [address], [place], [place] [address]    Report Status 08/01/2023 FINAL  Final  Gram stain     Status: None   Collection Time: 07/27/23 12:12 PM   Specimen: Pleura  Result Value Ref Range Status   Specimen Description PLEURAL  Final   Special Requests NONE  Final   Gram Stain   Final    NO WBC SEEN NO ORGANISMS SEEN Performed at [facility], [address] [place], [place] [address]    Report Status 07/27/2023 FINAL  Final  Blood Culture (routine x 2)     Status: None (Preliminary result)   Collection Time: 08/04/23  4:00 PM   Specimen: Left Antecubital; Blood  Result Value Ref Range Status   Specimen Description   Final    LEFT ANTECUBITAL BOTTLES DRAWN AEROBIC AND ANAEROBIC   Special Requests Blood Culture adequate volume  Final   Culture   Final    NO GROWTH < 24 HOURS Performed at [facility], [address], [place], [place] [address]    Report Status PENDING  Incomplete  Blood Culture (routine x 2)     Status: None (Preliminary result)   Collection Time: 08/04/23  4:00 PM   Specimen: BLOOD LEFT ARM  Result Value Ref Range Status   Specimen Description BLOOD LEFT  ARM BOTTLES DRAWN AEROBIC AND ANAEROBIC  Final   Special Requests Blood Culture adequate volume  Final   Culture   Final    NO GROWTH < 24 HOURS Performed at [facility], [address] [place], [place] [address]    Report Status PENDING  Incomplete  MRSA Next Gen by PCR, Nasal     Status: None   Collection Time: 08/05/23 12:19 AM   Specimen: Nasal Mucosa; Nasal Swab  Result Value Ref Range Status   MRSA by PCR Next Gen NOT DETECTED NOT DETECTED Final    Comment: (NOTE) The GeneXpert MRSA Assay (FDA approved for NASAL specimens only), is one component of a comprehensive MRSA colonization surveillance program. It is not intended to diagnose MRSA infection nor to guide or monitor treatment for MRSA infections. Test performance is not FDA approved in patients less than 52 years old. Performed at [facility], [address], [place], [place] [address]     Radiology Studies: CT Chest Wo Contrast Result Date: 08/04/2023 CLINICAL DATA:  Pneumonia, complication suspected, xray done Failure to thrive. EXAM: CT CHEST WITHOUT CONTRAST TECHNIQUE: Multidetector CT imaging of the chest was performed following the standard protocol without IV contrast. RADIATION DOSE REDUCTION: This exam was performed according to the departmental dose-optimization program which includes automated exposure control, adjustment of the mA and/or kV according to patient size and/or use of iterative reconstruction technique. COMPARISON:  Radiograph earlier today. CT 5 days ago 07/30/2023. Chest CT 08/10/2022 also reviewed FINDINGS: Cardiovascular: Dialysis catheter tip in the right atrium. The heart is normal in size. Dense calcification of native coronary arteries. Prosthetic aortic valve. Dense mitral annulus calcifications. Densely calcified thoracic aorta without aneurysm. Left-sided pacemaker with lead tips in the right atrium and ventricle. No  significant pericardial effusion. Mediastinum/Nodes: Technically limited  assessment in the absence of IV contrast and paucity of mediastinal fat. There is scattered small mediastinal lymph nodes, none of which are enlarged by size criteria. Hilar assessment is limited on this unenhanced exam. The esophagus is decompressed. Lungs/Pleura: Loculated right pleural effusion with fluid tracking into the minor fissure as well as anterior laterally, without significant interval change. There is also a small medial component at the right lung base associated compressive atelectasis throughout the right lung. Nodular density at the right lung apex measures 13 mm, without significant interval change from exam last year, likely scarring. Additional left apical pleuroparenchymal scarring is similar. There is overall improvement in the multifocal ground-glass opacities in the left lung consistent with resolving pneumonia. Mild residual patchy and nodular areas of ground-glass persist. Greatest residual is in the left lower lobe measuring 11 mm series 3, image 71. In area of ill-defined increased density within the basilar right lower lobe is unchanged over the last 5 days. Right lower lobe mucoid impaction within many segmental bronchi. Upper Abdomen: Gallstones. Posterior left renal cyst is unchanged from 2023. No further follow-up imaging is recommended. Musculoskeletal: Stable over the last 5 days. Minor T7 superior endplate compression deformity. Median sternotomy. IMPRESSION: 1. Moderate-sized loculated pleural effusion is unchanged over the last 5 days. There is adjacent compressive atelectasis throughout the right lung. 2. Improvement in patchy areas of ground-glass likely resolving pneumonia. Areas of ground-glass persist, recommend CT follow-up to resolution after course of treatment. CT in 3 months would be reasonable. 3. Persistent area of increased density at the right lung base. This may represent an area of chronic atelectasis/aspiration, however is mention previously, difficult to  exclude neoplasm. Consider pulmonary follow-up and outpatient PET CT as clinically indicated. 4. Stable area of nodular thickening at the right lung apex, favored to be scarring. 5. Additional stable chronic findings as described. Aortic Atherosclerosis (ICD10-I70.0). Electronically Signed   By: Andrea Gasman M.D.   On: 08/04/2023 18:40   DG Chest Port 1 View Result Date: 08/04/2023 CLINICAL DATA:  Questionable sepsis - evaluate for abnormality Failure to thrive. EXAM: PORTABLE CHEST 1 VIEW COMPARISON:  Radiograph 07/29/2023, CT 07/30/2023 FINDINGS: Right-sided dialysis catheter remains in place. Left-sided pacemaker in place. Prior median sternotomy with prosthetic aortic valve. Loculated right pleural effusion with questionable increase from prior exam. Associated opacity in the right hemithorax is without significant interval change. Areas of ground-glass consolidation in the left lung on prior CT are not well demonstrated by radiograph. No pneumothorax. No convincing pulmonary edema. IMPRESSION: 1. Stable radiographic appearance of the chest. 2. Loculated right pleural effusion and opacities in the right hemithorax are unchanged over the last 6 days. 3. Additional patchy airspace disease on prior CT have no definite radiographic correlate. Electronically Signed   By: Andrea Gasman M.D.   On: 08/04/2023 16:06   Scheduled Meds:  Chlorhexidine  Gluconate Cloth  6 each Topical Daily   Chlorhexidine  Gluconate Cloth  6 each Topical Q0600   insulin  glargine-yfgn  6 Units Subcutaneous Daily   sodium chloride  flush  3 mL Intravenous Q12H   sodium chloride  flush  3 mL Intravenous Q12H   Continuous Infusions:  sodium chloride      dextrose  5 % and 0.9 % NaCl 10 mL/hr at 08/05/23 0020   doxycycline  (VIBRAMYCIN ) IV 100 mg (08/05/23 0117)    LOS: 1 day   Rendall Carwin M.D on 08/05/2023 at 10:51 AM  Go to [email or web address] - for contact info  [facility] - Office  [phone]  If 7PM-7AM,  please contact night-coverage [email or web address] 08/05/2023, 10:51 AM

## 2023-08-05 NOTE — Procedures (Signed)
 HD Note:  Some information was entered later than the data was gathered due to patient care needs. The stated time with the data is accurate.  Patient treatment was done at bedside.  Alert and oriented.   Informed consent signed and in chart.   Access used: Right upper chest HD catheter Access issues: None  Patient BP became hypotensive and UF had to be paused.  BP lowered and pulse raised.  100 ml NS given. Dr. Dennise notified with new orders to administer 25 g of Albumen and to keep patient even with fluids. See Flowsheet for details  TX duration: 3 hours  Alert, without acute distress.  Patient gained 100 ml of NS from bolus  Hand-off given to patient's nurse.    Veneda Kirksey L. Lenon, RN [facility].

## 2023-08-06 DIAGNOSIS — R112 Nausea with vomiting, unspecified: Secondary | ICD-10-CM

## 2023-08-06 DIAGNOSIS — K7460 Unspecified cirrhosis of liver: Secondary | ICD-10-CM | POA: Diagnosis not present

## 2023-08-06 DIAGNOSIS — Z860101 Personal history of adenomatous and serrated colon polyps: Secondary | ICD-10-CM

## 2023-08-06 DIAGNOSIS — K5909 Other constipation: Secondary | ICD-10-CM | POA: Diagnosis not present

## 2023-08-06 DIAGNOSIS — K625 Hemorrhage of anus and rectum: Secondary | ICD-10-CM | POA: Diagnosis not present

## 2023-08-06 DIAGNOSIS — R5383 Other fatigue: Secondary | ICD-10-CM | POA: Diagnosis not present

## 2023-08-06 DIAGNOSIS — K7581 Nonalcoholic steatohepatitis (NASH): Secondary | ICD-10-CM | POA: Diagnosis not present

## 2023-08-06 LAB — GLUCOSE, CAPILLARY
Glucose-Capillary: 182 mg/dL — ABNORMAL HIGH (ref 70–99)
Glucose-Capillary: 242 mg/dL — ABNORMAL HIGH (ref 70–99)
Glucose-Capillary: 217 mg/dL — ABNORMAL HIGH (ref 70–99)
Glucose-Capillary: 177 mg/dL — ABNORMAL HIGH (ref 70–99)

## 2023-08-06 LAB — RENAL FUNCTION PANEL
Albumin: 3.6 g/dL (ref 3.5–5.0)
Anion gap: 18 — ABNORMAL HIGH (ref 5–15)
BUN: 33 mg/dL — ABNORMAL HIGH (ref 8–23)
CO2: 19 mmol/L — ABNORMAL LOW (ref 22–32)
Calcium: 9.1 mg/dL (ref 8.9–10.3)
Chloride: 91 mmol/L — ABNORMAL LOW (ref 98–111)
Creatinine, Ser: 1.38 mg/dL — ABNORMAL HIGH (ref 0.44–1.00)
GFR, Estimated: 41 mL/min — ABNORMAL LOW (ref >60)
Glucose, Bld: 350 mg/dL — ABNORMAL HIGH (ref 70–99)
Phosphorus: 2.6 mg/dL (ref 2.5–4.6)
Potassium: 3.8 mmol/L (ref 3.5–5.1)
Sodium: 128 mmol/L — ABNORMAL LOW (ref 135–145)

## 2023-08-06 LAB — CBC
HCT: 21.4 % — ABNORMAL LOW (ref 36.0–46.0)
Hemoglobin: 7.1 g/dL — ABNORMAL LOW (ref 12.0–15.0)
MCH: 30.7 pg (ref 26.0–34.0)
MCHC: 33.2 g/dL (ref 30.0–36.0)
MCV: 92.6 fL (ref 80.0–100.0)
Platelets: 201 10*3/uL (ref 150–400)
RBC: 2.31 MIL/uL — ABNORMAL LOW (ref 3.87–5.11)
RDW: 14.2 % (ref 11.5–15.5)
WBC: 12.2 10*3/uL — ABNORMAL HIGH (ref 4.0–10.5)
nRBC: 0 % (ref 0.0–0.2)

## 2023-08-06 LAB — IRON AND TIBC
Iron: 56 ug/dL (ref 28–170)
Saturation Ratios: 35 % — ABNORMAL HIGH (ref 10.4–31.8)
TIBC: 161 ug/dL — ABNORMAL LOW (ref 250–450)
UIBC: 105 ug/dL

## 2023-08-06 LAB — HEPATITIS B SURFACE ANTIBODY, QUANTITATIVE: Hep B S AB Quant (Post): 8048 m[IU]/mL

## 2023-08-06 LAB — FERRITIN: Ferritin: 2002 ng/mL — ABNORMAL HIGH (ref 11–307)

## 2023-08-06 LAB — OCCULT BLOOD X 1 CARD TO LAB, STOOL: Fecal Occult Bld: POSITIVE — AB

## 2023-08-06 MED ORDER — PANTOPRAZOLE SODIUM 40 MG IV SOLR
40.0000 mg | Freq: Two times a day (BID) | INTRAVENOUS | Status: DC
  Administered 2023-08-06 – 2023-08-09 (×6): 40 mg via INTRAVENOUS
  Filled 2023-08-06 (×6): qty 10

## 2023-08-06 MED ORDER — ALBUMIN HUMAN 25 % IV SOLN
INTRAVENOUS | Status: AC
Start: 2023-08-06 — End: ?
  Filled 2023-08-06: qty 100

## 2023-08-06 MED ORDER — HEPARIN SODIUM (PORCINE) 1000 UNIT/ML IJ SOLN
INTRAMUSCULAR | Status: AC
Start: 2023-08-06 — End: ?
  Filled 2023-08-06: qty 4

## 2023-08-06 MED ORDER — LACTULOSE 10 GM/15ML PO SOLN
10.0000 g | Freq: Two times a day (BID) | ORAL | Status: DC
Start: 2023-08-06 — End: 2023-08-09
  Administered 2023-08-06 – 2023-08-09 (×6): 10 g via ORAL
  Filled 2023-08-06 (×6): qty 30

## 2023-08-06 MED ORDER — ALBUMIN HUMAN 25 % IV SOLN
25.0000 g | Freq: Once | INTRAVENOUS | Status: AC
  Administered 2023-08-06: 25 g via INTRAVENOUS

## 2023-08-06 MED ORDER — GABAPENTIN 100 MG PO CAPS
100.0000 mg | ORAL_CAPSULE | Freq: Every day | ORAL | Status: DC
  Administered 2023-08-06 – 2023-08-08 (×3): 100 mg via ORAL
  Filled 2023-08-06 (×3): qty 1

## 2023-08-06 MED ORDER — GABAPENTIN 100 MG PO CAPS
100.0000 mg | ORAL_CAPSULE | Freq: Every day | ORAL | Status: DC
Start: 2023-08-06 — End: 2023-08-06

## 2023-08-06 MED ORDER — ONDANSETRON HCL 4 MG/2ML IJ SOLN
4.0000 mg | Freq: Four times a day (QID) | INTRAMUSCULAR | Status: DC | PRN
  Administered 2023-08-06 – 2023-08-08 (×2): 4 mg via INTRAVENOUS
  Filled 2023-08-06 (×2): qty 2

## 2023-08-06 NOTE — TOC Progression Note (Signed)
 Transition of Care [facility]) - Progression Note    Patient Details  Name: [name] [name] MRN: [id number] Date of Birth: 05-11-1951  Transition [facility]) CM/SW Contact  [place] Saddie Kim, [place] Phone Number: 08/06/2023, 8:09 AM  Clinical Narrative:  Discussed PT recommendation for SNF with pt's husband. He states pt is doing better this morning already and at this point wants to plan to return home with [place] home [facility]. Husband aware to notify TOC if plan changes. TOC will follow.      Expected Discharge Plan: Home w Home Health Services Barriers to Discharge: Continued Medical Work up  Expected Discharge Plan and Services     Post Acute Care Choice: Resumption of Svcs/PTA Provider Living arrangements for the past 2 months: Single Family Home                                       Social Determinants of Health (SDOH) Interventions SDOH Screenings   Food Insecurity: No Food Insecurity (08/06/2023)  Housing: Low Risk  (08/06/2023)  Transportation Needs: No Transportation Needs (08/06/2023)  Utilities: Not At Risk (08/06/2023)  Depression (PHQ2-9): Low Risk  (09/08/2018)  Financial Resource Strain: Low Risk  (01/02/2023)   Received from [facility], [facility]  Physical Activity: Insufficiently Active (03/17/2021)   Received from [name]  Social Connections: Socially Isolated (08/06/2023)  Stress: Stress Concern Present (03/17/2021)   Received from [facility], [facility]  Tobacco Use: Low Risk  (08/04/2023)    Readmission Risk Interventions    08/05/2023    1:00 PM  Readmission Risk Prevention Plan  Transportation Screening Complete  HRI or Home Care Consult Complete  Social Work Consult for Recovery Care Planning/Counseling Complete  Palliative Care Screening Complete  Medication Review Oceanographer) Complete

## 2023-08-06 NOTE — Consult Note (Signed)
 @LOGO @   Referring Provider: [facility]  Primary Care Physician:  Rolinda Millman, MD Primary Gastroenterologist:  [facility]  Date of Admission: 08/04/23 Date of Consultation: 08/06/23  Reason for Consultation:  Acute on chronic anemia, GI bleed  HPI:  [name] [name] is a 73 y.o. 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 pylori positive), recent hospitalization earlier this month with influenza A, pleural effusion, weakness, altered mental status, now admitted again on 08/04/2023 with acute metabolic encephalopathy and generalized weakness and deconditioning.   It was felt that her acute metabolic encephalopathy and generalized weakness/deconditioning was multifactorial in setting of influenza (diagnosed 07/24/2023), ESRD with missed hemodialysis sessions, OSA with chronic hypercapnia with intolerance to BiPAP/NIV.  Her ammonia levels were not elevated.  Nephrology was consulted to resume hemodialysis.  She received hemodialysis yesterday and plan to keep MWF schedule.  She was also noted to be anemic with a hemoglobin of 9.5 on admission, down from recent baseline in the 10-11 range.  Stool was heme positive.  Eliquis  was placed on hold.  GI consulted for further evaluation of anemia, heme positive stool as primary team would like to resume blood thinner, but wanted GI clearance.   Consult:  Reports new onset maroon-colored stool within the last month or so.  She had an episode while she was admitted to [facility].  States she did not have any rectal bleeding when she was at home, but had an episode of maroon-colored stool today.  States she had a very large caliber bowel movement with some maroon-colored stool  around it.  Confirmed this with nursing staff who stated the stool was more dark/maroon color.  Reports having some mild abdominal pain prior to a bowel movement that resolves thereafter.  No lingering abdominal pain.  No significant change in bowel habits.  Chronically has constipation.  She takes MiraLAX  outpatient and tends to have a bowel movement every day.  Also reports having trouble with nausea/vomiting since she got sick earlier this month.  Reports vomiting x 2 today after an episode of coughing.  Reports she has trouble swallowing pills.  This has been a chronic issue for her but it seems to be getting worse.  Denies any food dysphagia.  She does report she woke up after eating lunch today with sensation of foods coming back up and ended up vomiting.  Reports history of reflux that is controlled on famotidine  outpatient.  Denies NSAIDs aside from 81 mg aspirin .  Reports mental status is not back to baseline.  Brother who is at bedside confirms this.  States patient is usually very sharp, but mental status change when she became acutely ill at the beginning of this month.  States mental status did improve prior to her recent hospital discharge on 1/9, but became worse after being at home with increasing weakness as well. States she wasn't able to walk alone any more.  She is oriented x 4 today, but is very slow in her response time/speech.  Nursing staff states mental status though not back to baseline is much improved compared to yesterday.    She also has a loculated pleural effusion which was present during recent admission as well. She had thoracentesis  during her last hospitalization on 1/4.  CT chest without contrast 1/12 showed moderate size loculated pleural effusion, unchanged over the last 5 days with adjacent compressive atelectasis throughout the right lung, improvement in patchy areas of groundglass likely resolving pneumonia.  Also noted persistent area of increased density in the  right lung base which could be atelectasis/aspiration though difficult to exclude neoplasm.  She is currently on doxycycline .  Does not feel that her respiratory status is at baseline.  Overall, feels that her cough is getting worse.  She is maintaining her O2 saturations on room air.    EGD 07/26/2022 ([facility]) revealed a small hiatal hernia and fundic gland polyps. There were no esophageal varices. There was no determined etiology for her dysphagia.   Capsule endoscopy October 2014 was normal.   Colonoscopy 04/07/20: Tubular adenoma per pathology (care everywhere). No report on file.     Past Medical History:  Diagnosis Date   Anxiety    Aortic stenosis    a. bicuspid aortic valve; mean gradient of 20 mmHg in 2/10; b. s/p bioprosth AVR (19-mm Edwards pericardial valve) with a redo coronary artery bypass graft procedure in 07/2009; postoperative Dressler's syndrome     Arthritis    Breast cancer (HCC)    Carcinoma, renal cell (HCC) 05/2007   Laparoscopic right nephrectomy   Chronic diastolic CHF (congestive heart failure) (HCC)    a. 9.2014 EchoP EF 60-65%, no rwma, bioprosth AVR, mean gradient of , mild to mod MR, PASP .   Colitis, ischemic (HCC) 2008   Complication of anesthesia    Coronary artery disease    a. initial CABG with SVG-RCA in 12/05. b. redo SVG-RCA and bioprosthetic AVR in 1/11. d. 9/14 - PCI with DES to distal LM/proximal LAD was done rather than bypass as she would have been a poor candidate for redo sternotomy. d. S/p DES to prox LM 05/2014.   Diabetes mellitus 03/2010    TYPE II: Hemoglobin A1c of 7.4 in  03/2010; 8.4 in 06/2010; treated with insulin  pump   Duodenal ulcer    Remote; H. pylori positive   Dyspnea    Gastroparesis due to DM (HCC) 05/01/2012   Glaucoma 1998   Hodgkin's disease (HCC) 1991   Mantle radiation therapy   Hyperlipidemia    Hyperplastic gastric polyp 06/23/2012   Hypertension    Iron  deficiency anemia    a. 03/2013 EGD:  essentially normal. Possible slow GIB.   Migraines    NASH (nonalcoholic steatohepatitis) 1999   -biopsy in 1999   OSA treated with BiPAP    Osteoporosis    Pacemaker    six sinus syndrome   PAF (paroxysmal atrial fibrillation) (HCC)    a.  h/o brief episodes noted on ppm interrogation. b. given GI bleeding and need to be on both ASA 81 and Brilinta , risks likely outweigh benefits of anticoagulation.    PONV (postoperative nausea and vomiting)    Small bowel obstruction (HCC)    Recurrent; resolved after resection of a lipoma   Syncope    a. H/o recurrent syncope with pauses on loop recorder s/p Medtronic pacemaker 07/2012.    Past Surgical History:  Procedure Laterality Date   ABDOMINAL HYSTERECTOMY  2000   AORTIC VALVE REPLACEMENT  2011   Aortic valve replacement surgery with a 19 mm bioprosthetic device post redo CABG January 2011.   AV FISTULA PLACEMENT Right 05/01/2023   Procedure: RIGHT UPPER ARM BRACHIOBASILIC ARTERIOVENOUS FISTULA CREATION;  Surgeon: Serene Gaile ORN, MD;  Location: [facility] OR;  Service: Vascular;  Laterality: Right;   BONE MARROW BIOPSY     BREAST EXCISIONAL BIOPSY     benign, bilateral   CARDIAC CATHETERIZATION     CERVICAL BIOPSY     COLONOSCOPY     Multiple with adenomatous polyps   COLONOSCOPY  06/23/2012   Procedure: COLONOSCOPY;  Surgeon: Lupita FORBES Commander, MD;  Location: [facility] ENDOSCOPY;  Service: Endoscopy;  Laterality: N/A;   CORONARY ARTERY BYPASS GRAFT  2005, 2011   CABG-most recent SVG to RCA-12/05; RCA occluded with patent graft in 2006; Redo bypass in 07/2009   ESOPHAGOGASTRODUODENOSCOPY     ESOPHAGOGASTRODUODENOSCOPY  06/23/2012   Procedure: ESOPHAGOGASTRODUODENOSCOPY (EGD);  Surgeon: Lupita FORBES Commander, MD;  Location: [facility];  Service: Endoscopy;  Laterality: N/A;   ESOPHAGOGASTRODUODENOSCOPY N/A 04/06/2013   Procedure: ESOPHAGOGASTRODUODENOSCOPY (EGD);  Surgeon: Norleen LOISE Kiang, MD;  Location: [facility] ENDOSCOPY;  Service: Endoscopy;  Laterality: N/A;    EXPLORATORY LAPAROTOMY W/ BOWEL RESECTION     Small bowel resection of lipoma   GIVENS CAPSULE STUDY N/A 04/30/2013   Procedure: GIVENS CAPSULE STUDY;  Surgeon: Lupita FORBES Commander, MD;  Location: [facility] ENDOSCOPY;  Service: Endoscopy;  Laterality: N/A;   INSERTION OF DIALYSIS CATHETER Right 05/01/2023   Procedure: INSERTION OF TUNNELED DIALYSIS CATHETER USING PALINDROME CATHETER 23CM;  Surgeon: Serene Gaile ORN, MD;  Location: [facility] OR;  Service: Vascular;  Laterality: Right;   LEFT AND RIGHT HEART CATHETERIZATION WITH CORONARY ANGIOGRAM N/A 04/07/2013   Procedure: LEFT AND RIGHT HEART CATHETERIZATION WITH CORONARY ANGIOGRAM;  Surgeon: Ezra GORMAN Shuck, MD;  Location: [facility] CATH LAB;  Service: Cardiovascular;  Laterality: N/A;   LEFT ATRIAL APPENDAGE OCCLUSION N/A 02/16/2016   Watchman not placed - nickel allergy   LEFT HEART CATHETERIZATION WITH CORONARY/GRAFT ANGIOGRAM N/A 06/07/2014   Procedure: LEFT HEART CATHETERIZATION WITH EL BILE;  Surgeon: Lonni JONETTA Cash, MD;  Location: [facility] CATH LAB;  Service: Cardiovascular;  Laterality: N/A;   LIVER BIOPSY     LOOP RECORDER IMPLANT N/A 12/10/2011   Procedure: LOOP RECORDER IMPLANT;  Surgeon: Danelle ORN Birmingham, MD;  Location: [facility] CATH LAB;  Service: Cardiovascular;  Laterality: N/A;   MALONEY DILATION  06/23/2012   Procedure: AGAPITO DILATION;  Surgeon: Lupita FORBES Commander, MD;  Location: [facility] ENDOSCOPY;  Service: Endoscopy;;  54 fr   MASTECTOMY Left 05/17/2015   total - no lymph nodes removed per patient   NEPHRECTOMY  2008   Laparoscopic right nephrectomy, renal cell carcinoma   PACEMAKER INSERTION  08/21/2012   Medtronic Adapta L dual-chamber pacemaker   PERCUTANEOUS CORONARY STENT INTERVENTION (PCI-S) N/A 04/09/2013   Procedure: PERCUTANEOUS CORONARY STENT INTERVENTION (PCI-S);  Surgeon: Lonni JONETTA Cash, MD;  Location: [facility] CATH LAB;  Service: Cardiovascular;  Laterality: N/A;   PERMANENT PACEMAKER INSERTION N/A 08/21/2012   Procedure:  PERMANENT PACEMAKER INSERTION;  Surgeon: Danelle ORN Birmingham, MD;  Location: [facility] CATH LAB;  Service: Cardiovascular;  Laterality: N/A;   RIGHT HEART CATH N/A 03/18/2019   Procedure: RIGHT HEART CATH;  Surgeon: Shuck Ezra GORMAN, MD;  Location: [facility] INVASIVE CV LAB;  Service: Cardiovascular;  Laterality: N/A;   RIGHT HEART CATH N/A 08/21/2022   Procedure: RIGHT HEART CATH;  Surgeon: Shuck Ezra GORMAN, MD;  Location: [facility] INVASIVE CV LAB;  Service: Cardiovascular;  Laterality: N/A;   RIGHT HEART CATH AND CORONARY/GRAFT ANGIOGRAPHY N/A 04/19/2017   Procedure: RIGHT HEART CATH AND CORONARY/GRAFT ANGIOGRAPHY;  Surgeon: Shuck Ezra GORMAN, MD;  Location: [facility] INVASIVE CV LAB;  Service: Cardiovascular;  Laterality:  N/A;   TEE WITHOUT CARDIOVERSION N/A 12/07/2015   Procedure: TRANSESOPHAGEAL ECHOCARDIOGRAM (TEE);  Surgeon: Ezra GORMAN Shuck, MD;  Location: [facility] ENDOSCOPY;  Service: Cardiovascular;  Laterality: N/A;   TOTAL ABDOMINAL HYSTERECTOMY W/ BILATERAL SALPINGOOPHORECTOMY  2000   endometriosis and ovarian cysts   TOTAL MASTECTOMY Left 05/17/2015   Procedure: LEFT TOTAL MASTECTOMY;  Surgeon: Donnice Bury, MD;  Location: [facility] OR;  Service: General;  Laterality: Left;   TUMOR EXCISION  1981   removal of Hodgkins lymphoma    Prior to Admission medications   Medication Sig Start Date End Date Taking? Authorizing Provider  ALPRAZolam  (XANAX ) 0.5 MG tablet Take 1 tablet (0.5 mg total) by mouth 2 (two) times daily as needed for anxiety. Patient taking differently: Take 0.5 mg by mouth at bedtime. 01/15/18  Yes Hagler, Vernell, MD  apixaban  (ELIQUIS ) 5 MG TABS tablet Take 1 tablet (5 mg total) by mouth 2 (two) times daily. 07/12/23  Yes Shuck Ezra GORMAN, MD  benzonatate  (TESSALON ) 200 MG capsule Take 1 capsule (200 mg total) by mouth 3 (three) times daily. 08/01/23  Yes Ghimire, Donalda HERO, MD  Coenzyme Q10 (CO Q 10) 100 MG CAPS Take 100 mg by mouth at bedtime.    Yes [provider]  denosumab  (PROLIA ) 60 MG/ML  SOSY injection Inject 60 mg into the skin every 6 (six) months.   Yes [provider]  dextromethorphan  (DELSYM ) 30 MG/5ML liquid Take 5 mLs (30 mg total) by mouth at bedtime. 08/01/23  Yes Ghimire, Donalda HERO, MD  escitalopram  (LEXAPRO ) 5 MG tablet Take 5 mg by mouth at bedtime.   Yes [provider]  gabapentin  (NEURONTIN ) 100 MG capsule Take 100 mg by mouth daily.   Yes [provider]  glucagon  (GLUCAGON  EMERGENCY) 1 MG injection Inject 1 mg into the vein once as needed (low blood sugar).   Yes [provider]  insulin  glargine (LANTUS ) 100 UNIT/ML Solostar Pen Inject 6 Units into the skin daily. 08/02/23  Yes Ghimire, Donalda HERO, MD  latanoprost  (XALATAN ) 0.005 % ophthalmic solution Place 1 drop into both eyes at bedtime.   Yes [provider]  metoprolol  succinate (TOPROL  XL) 25 MG 24 hr tablet Take 0.5 tablets (12.5 mg total) by mouth daily. 05/22/23  Yes Shuck Ezra GORMAN, MD  midodrine  (PROAMATINE ) 5 MG tablet Take 1 tablet (5 mg total) by mouth daily as needed (Prior to HD). 05/08/23  Yes Shuck Ezra GORMAN, MD  mometasone  (NASONEX ) 50 MCG/ACT nasal spray Place 1 spray into the nose daily as needed (allergies). 03/10/20  Yes [provider]  nitroGLYCERIN  (NITROLINGUAL ) 0.4 MG/SPRAY spray PLACE 1 SPRAY UNDER TH TONGUE EVERY 5 MINUTES FOR 3 DOSES AS NEEDED FOR CHEST PAIN 12/25/22  Yes Shuck Ezra GORMAN, MD  polyethylene glycol (MIRALAX  / GLYCOLAX ) 17 g packet Take 17 g by mouth daily as needed for mild constipation.   Yes [provider]  REPATHA  SURECLICK 140 MG/ML SOAJ INJECT 140MG  INTO THE SKIN EVERY 14 DAYS 03/29/23  Yes Shuck Ezra GORMAN, MD  torsemide  (DEMADEX ) 20 MG tablet Take 20 mg by mouth 3 (three) times a week. Wednesday, Saturday, Sunday   Yes [provider]  VASCEPA  1 g capsule TAKE TWO CAPSULES (2 GRAMS TOTAL) BY MOUTH TWO TIMES DAILY 02/26/23  Yes McLean, [name] S, MD  APIDRA  100 UNIT/ML injection Inject 40 Units into the  skin as directed. INFUSE THROUGH INSULIN  PUMP UTD, Bolus depends on carb intake 02/04/15   [provider]  Insulin   Pen Needle 32G X 4 MM MISC Use as directed with insulin  pens 08/01/23   Ghimire, Donalda HERO, MD    Current Facility-Administered Medications  Medication Dose Route Frequency Provider Last Rate Last Admin   alteplase  (CATHFLO ACTIVASE ) injection 2 mg  2 mg Intracatheter Once PRN Singh, Vikas, MD       aspirin  EC tablet 81 mg  81 mg Oral Q breakfast Pearlean, Courage, MD   81 mg at 08/06/23 0844   Chlorhexidine  Gluconate Cloth 2 % PADS 6 each  6 each Topical Daily Tobie Mario GAILS, MD   6 each at 08/06/23 0845   Chlorhexidine  Gluconate Cloth 2 % PADS 6 each  6 each Topical Q0600 Dennise Hoes, MD   6 each at 08/06/23 0450   doxycycline  (VIBRAMYCIN ) 100 mg in dextrose  5 % 250 mL IVPB  100 mg Intravenous Q12H Tobie Mario GAILS, MD 125 mL/hr at 08/06/23 0902 100 mg at 08/06/23 9097   gabapentin  (NEURONTIN ) capsule 100 mg  100 mg Oral QHS Adefeso, Oladapo, DO       guaiFENesin -dextromethorphan  (ROBITUSSIN DM) 100-10 MG/5ML syrup 5 mL  5 mL Oral Q4H PRN Adefeso, Oladapo, DO   5 mL at 08/06/23 1353   heparin  injection 1,000 Units  1,000 Units Intracatheter PRN Singh, Vikas, MD       insulin  glargine-yfgn (SEMGLEE ) injection 3 Units  3 Units Subcutaneous Daily Emokpae, Courage, MD   3 Units at 08/05/23 2215   insulin  glulisine (APIDRA ) injection 0-5 Units  0-5 Units Subcutaneous QHS Emokpae, Courage, MD       insulin  glulisine (APIDRA ) injection 0-9 Units  0-9 Units Subcutaneous TID WC Emokpae, Courage, MD   3 Units at 08/06/23 1212   insulin  glulisine (APIDRA ) injection 2 Units  2 Units Subcutaneous TID WC Emokpae, Courage, MD       metoprolol  tartrate (LOPRESSOR ) tablet 12.5 mg  12.5 mg Oral BID Emokpae, Courage, MD   12.5 mg at 08/06/23 0844   ondansetron  (ZOFRAN ) injection 4 mg  4 mg Intravenous Q6H PRN Emokpae, Courage, MD   4 mg at 08/06/23 1349   Oral care mouth rinse  15 mL Mouth Rinse  PRN Patel, Ekta V, MD       phenol (CHLORASEPTIC) mouth spray 1 spray  1 spray Mouth/Throat PRN Emokpae, Courage, MD       pravastatin  (PRAVACHOL ) tablet 20 mg  20 mg Oral q1800 Emokpae, Courage, MD   20 mg at 08/05/23 1756   sodium chloride  flush (NS) 0.9 % injection 3 mL  3 mL Intravenous Q12H Tobie Mario V, MD   3 mL at 08/05/23 1110   sodium chloride  flush (NS) 0.9 % injection 3 mL  3 mL Intravenous Q12H Tobie Mario V, MD   3 mL at 08/06/23 0846   sodium chloride  flush (NS) 0.9 % injection 3 mL  3 mL Intravenous PRN Tobie Mario GAILS, MD        Allergies as of 08/04/2023 - Review Complete 08/04/2023  Allergen Reaction Noted   Avandia [rosiglitazone maleate] Other (See Comments) 05/20/2008   Cephalexin Anaphylaxis, Swelling, and Rash 05/20/2008   Clindamycin Anaphylaxis and Shortness Of Breath 11/25/2013   Humalog [insulin  lispro] Itching 06/06/2014   Lincomycin Anaphylaxis and Shortness Of Breath 11/25/2013   Metformin Other (See Comments) 05/20/2008   Novolog  [insulin  aspart (human analog)] Hives and Itching 04/04/2013   Penicillins Anaphylaxis, Hives, Swelling, and Rash 05/20/2008   Tizanidine Other (See Comments) 12/02/2012   Adhesive [tape] Other (See Comments) 02/13/2016  Atorvastatin Other (See Comments) 05/20/2008   Cholestyramine Other (See Comments) 03/16/2014   Gentamycin [gentamicin ] Hives, Itching, and Rash 12/21/2011   Iodinated contrast media Other (See Comments) 07/20/2011   Limonene Hives, Itching, Rash, and Other (See Comments) 10/07/2014   Prednisone  Itching and Other (See Comments) 02/08/2016   Ranexa  [ranolazine ] Other (See Comments) 10/14/2017   Rosuvastatin Nausea And Vomiting, Nausea Only, and Other (See Comments) 04/03/2013   Simvastatin  Other (See Comments) 05/20/2008   Zinc   06/04/2023   Acetaminophen   05/01/2023   Azelastine  Other (See Comments) 10/10/2020   Clopidogrel  Other (See Comments) 04/29/2013   Kerendia  [finerenone ] Hives and Nausea Only  06/13/2022   Lisinopril  Diarrhea, Nausea Only, and Other (See Comments) 10/10/2020   Neosporin [bacitracin -polymyxin b ] Dermatitis 04/19/2023   Codeine Nausea And Vomiting 05/20/2008   Erythromycin  Nausea And Vomiting 05/20/2008   Hydrocodone -acetaminophen  Itching, Rash, and Other (See Comments) 06/28/2015   Latex Itching and Other (See Comments) 05/10/2015   Neomycin Rash and Other (See Comments) 02/13/2016   Nickel Itching 04/21/2015   Ranitidine Nausea Only    Tamiflu [oseltamivir] Nausea And Vomiting 08/26/2013   Tramadol Nausea Only     Family History  Problem Relation Age of Onset   Heart attack Mother        d.61   Heart disease Mother    Anal fissures Mother    Heart attack Father        d.60   Diabetes Father    Heart disease Father    Lung cancer Sister        d.61 metastases to brain. History of smoking. Maternal half-sister.   Cancer Sister 30       unspecified gynecologic cancer (either cervical or ovarian) in her 30s.   Breast cancer Sister 81       d.42 from metastatic disease. Paternal half-sister.   Hypertension Brother    Glaucoma Brother    Parkinson's disease Paternal Aunt    Heart attack Maternal Grandmother    Cancer Maternal Grandfather 50       d.52s oral cancer   Colon cancer Neg Hx    Stomach cancer Neg Hx    Parkinsonism Neg Hx     Social History   Socioeconomic History   Marital status: Married    Spouse name: Not on file   Number of children: 2   Years of education: Not on file   Highest education level: Doctorate  Occupational History   Occupation: retired    Comment: elementary principal  Tobacco Use   Smoking status: Never   Smokeless tobacco: Never  Vaping Use   Vaping status: Never Used  Substance and Sexual Activity   Alcohol use: No    Comment: social, 31s   Drug use: Never   Sexual activity: Yes  Other Topics Concern   Not on file  Social History Narrative   Retired Programmer, Multimedia principal, married    Started disability  September 2013      Has one daughter in [place], works at [employer]. Has a son, Redell, who is press photographer.   Eats all food groups.   Wear seatbelt.    Attends church. [place], [place]. [place].    Social Drivers of Corporate Investment Banker Strain: Low Risk  (01/02/2023)   Received from [facility], [facility]   Overall Financial Resource Strain (CARDIA)    Difficulty of Paying Living Expenses: Not hard at all  Food Insecurity: No Food Insecurity (  08/06/2023)   Hunger Vital Sign    Worried About Running Out of Food in the Last Year: Never true    Ran Out of Food in the Last Year: Never true  Transportation Needs: No Transportation Needs (08/06/2023)   PRAPARE - Administrator, Civil Service (Medical): No    Lack of Transportation (Non-Medical): No  Physical Activity: Insufficiently Active (03/17/2021)   [facility] from [facility]   Exercise Vital Sign  Stress: Stress Concern Present (03/17/2021)   Received from [facility], [facility]   [employer] of Occupational Health - Occupational Stress Questionnaire    Feeling of Stress : To some extent  Social Connections: Socially Isolated (08/06/2023)   Social Connection and Isolation Panel [NHANES]    Frequency of Communication with Friends and Family: Never    Frequency of Social Gatherings with Friends and Family: Never    Attends Religious Services: Never    Database Administrator or Organizations: No    Attends Banker Meetings: Never    Marital Status: Married  Catering Manager Violence: Not At Risk (08/06/2023)   Humiliation, Afraid, Rape, and Kick questionnaire    Fear of Current or Ex-Partner: No    Emotionally Abused: No    Physically Abused: No    Sexually Abused: No    Review of Systems: Gen: Denies lightheadedness, dizziness, presyncope. CV: Denies chest pain, heart palpitations. Resp: Admits  to shortness of breath and cough. GI: See HPI GU : Denies urinary burning, urinary frequency, urinary incontinence.  MS: Denies joint pain. Derm: Denies rash.  Psych: Denies depression, anxiety,confusion, or memory loss Heme: Denies bruising, bleeding, and enlarged lymph nodes.  Physical Exam: Vital signs in last 24 hours: Temp:  [97.9 F (36.6 C)-98.4 F (36.9 C)] 97.9 F (36.6 C) (01/14 1107) Pulse Rate:  [94-136] 104 (01/14 1400) Resp:  [11-27] 17 (01/14 1400) BP: (54-147)/(25-74) 114/67 (01/14 1400) SpO2:  [94 %-100 %] 95 % (01/14 1400) Weight:  [47.7 kg] 47.7 kg (01/14 0358)   General:   Ill appearing, frail, sitting in recliner, in NAD Head:  Normocephalic and atraumatic.  Eyes:  Sclera clear, no icterus.   Conjunctiva pink. Bruising around left eye from CPAP during prior admission per patient's report.  Ears:  Normal auditory acuity. Lungs:  Decreased breath sounds in right lower lung field. Otherwise, clear to auscultation.  No acute distress. On room air.  Heart:  Regular rate and rhythm; no murmurs, clicks, rubs,  or gallops. Abdomen:  Normal bowel sounds, soft, nontender and nondistended without guarding or rebound.  . No masses, or hernias noted. Exam somewhat limited as patient was in recliner.  Rectal:  Deferred Msk:  Symmetrical without gross deformities. Normal posture. Extremities:  Without edema. Neurologic:  Alert and  oriented x4; though clearly requiring extended time for thought processing and slow in her speech. No clear asterixis though some slight shaking of her hands likely related to generalized weakness.  Skin:  Intact without significant lesions or rashes. Psych:  Normal mood and affect.  Intake/Output from previous day: 01/13 0701 - 01/14 0700 In: 1367.7 [P.O.:500; I.V.:136.2; IV Piggyback:731.5] Out: 0  Intake/Output this shift: Total I/O In: 100 [P.O.:100] Out: 100 [Urine:100]  Lab Results: Recent Labs    08/04/23 1600 08/05/23 0323  WBC  10.6* 12.3*  HGB 9.5* 9.4*  HCT 28.0* 25.8*  PLT 186 209   BMET Recent Labs    08/04/23 1600 08/05/23 0323  08/06/23 0345  NA 128* 130* 128*  K 3.8 4.0 3.8  CL 93* 95* 91*  CO2 24 21* 19*  GLUCOSE 202* 193* 350*  BUN 85* 91* 33*  CREATININE 2.93* 2.79* 1.38*  CALCIUM  9.6 9.4 9.1   LFT Recent Labs    08/04/23 1600 08/05/23 0323 08/06/23 0345  PROT 7.0 6.5  --   ALBUMIN  3.0* 2.8* 3.6  AST 24 21  --   ALT 16 14  --   ALKPHOS 241* 197*  --   BILITOT 2.0* 2.2*  --    PT/INR Recent Labs    08/04/23 1600 08/05/23 0323  LABPROT 39.1* 36.7*  INR 4.0* 3.7*   Hepatitis Panel Recent Labs    08/04/23 1554  HEPBSAG NON REACTIVE   C-Diff No results for input(s): CDIFFTOX in the last 72 hours.  Studies/Results: CT Chest Wo Contrast Result Date: 08/04/2023 CLINICAL DATA:  Pneumonia, complication suspected, xray done Failure to thrive. EXAM: CT CHEST WITHOUT CONTRAST TECHNIQUE: Multidetector CT imaging of the chest was performed following the standard protocol without IV contrast. RADIATION DOSE REDUCTION: This exam was performed according to the departmental dose-optimization program which includes automated exposure control, adjustment of the mA and/or kV according to patient size and/or use of iterative reconstruction technique. COMPARISON:  Radiograph earlier today. CT 5 days ago 07/30/2023. Chest CT 08/10/2022 also reviewed FINDINGS: Cardiovascular: Dialysis catheter tip in the right atrium. The heart is normal in size. Dense calcification of native coronary arteries. Prosthetic aortic valve. Dense mitral annulus calcifications. Densely calcified thoracic aorta without aneurysm. Left-sided pacemaker with lead tips in the right atrium and ventricle. No significant pericardial effusion. Mediastinum/Nodes: Technically limited assessment in the absence of IV contrast and paucity of mediastinal fat. There is scattered small mediastinal lymph nodes, none of which are enlarged by  size criteria. Hilar assessment is limited on this unenhanced exam. The esophagus is decompressed. Lungs/Pleura: Loculated right pleural effusion with fluid tracking into the minor fissure as well as anterior laterally, without significant interval change. There is also a small medial component at the right lung base associated compressive atelectasis throughout the right lung. Nodular density at the right lung apex measures 13 mm, without significant interval change from exam last year, likely scarring. Additional left apical pleuroparenchymal scarring is similar. There is overall improvement in the multifocal ground-glass opacities in the left lung consistent with resolving pneumonia. Mild residual patchy and nodular areas of ground-glass persist. Greatest residual is in the left lower lobe measuring 11 mm series 3, image 71. In area of ill-defined increased density within the basilar right lower lobe is unchanged over the last 5 days. Right lower lobe mucoid impaction within many segmental bronchi. Upper Abdomen: Gallstones. Posterior left renal cyst is unchanged from 2023. No further follow-up imaging is recommended. Musculoskeletal: Stable over the last 5 days. Minor T7 superior endplate compression deformity. Median sternotomy. IMPRESSION: 1. Moderate-sized loculated pleural effusion is unchanged over the last 5 days. There is adjacent compressive atelectasis throughout the right lung. 2. Improvement in patchy areas of ground-glass likely resolving pneumonia. Areas of ground-glass persist, recommend CT follow-up to resolution after course of treatment. CT in 3 months would be reasonable. 3. Persistent area of increased density at the right lung base. This may represent an area of chronic atelectasis/aspiration, however is mention previously, difficult to exclude neoplasm. Consider pulmonary follow-up and outpatient PET CT as clinically indicated. 4. Stable area of nodular thickening at the right lung apex,  favored to be scarring. 5.  Additional stable chronic findings as described. Aortic Atherosclerosis (ICD10-I70.0). Electronically Signed   By: Andrea Gasman M.D.   On: 08/04/2023 18:40   DG Chest Port 1 View Result Date: 08/04/2023 CLINICAL DATA:  Questionable sepsis - evaluate for abnormality Failure to thrive. EXAM: PORTABLE CHEST 1 VIEW COMPARISON:  Radiograph 07/29/2023, CT 07/30/2023 FINDINGS: Right-sided dialysis catheter remains in place. Left-sided pacemaker in place. Prior median sternotomy with prosthetic aortic valve. Loculated right pleural effusion with questionable increase from prior exam. Associated opacity in the right hemithorax is without significant interval change. Areas of ground-glass consolidation in the left lung on prior CT are not well demonstrated by radiograph. No pneumothorax. No convincing pulmonary edema. IMPRESSION: 1. Stable radiographic appearance of the chest. 2. Loculated right pleural effusion and opacities in the right hemithorax are unchanged over the last 6 days. 3. Additional patchy airspace disease on prior CT have no definite radiographic correlate. Electronically Signed   By: Andrea Gasman M.D.   On: 08/04/2023 16:06    Impression: 73 y.o. 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 pylori positive), recent hospitalization earlier this month with influenza A, pleural effusion, weakness, altered mental status, now admitted again on 08/04/2023 with acute metabolic encephalopathy and generalized weakness and deconditioning. Also with acute on chronic anemia with episode of rectal bleeding and GI now consulted for further evaluation.   Acute on chronic anemia/GI bleeding:   Couple episodes of rectal bleeding with maroon colored stool over the last few weeks. Had an episode of maroon colored stool today which was the first since prior hospital admission. No H/H on file today. BP stable but has mild tachycardia.  Known history of cirrhosis, but no varices previously.  Last EGD on file 07/26/22 without varices.  Last colonoscopy September 2021 with tubular adenoma removed.  Prior capsule endoscopy in 2014 with normal exam.  Denies NSAIDs aside from 81 mg aspirin .  She is chronically anticoagulated on Eliquis  with last dose on 1/12.  INR quite elevated on admission at 4.0 on admission in the setting of anticoagulation and cirrhosis. INR, 3.7 yesterday, no repeat today.   Etiology of bleeding is not clear. Difficult to interpret BUN in the setting of CKD and missed dialysis.   Intermittent bleeding episodes do not seem consistent with variceal bleeding but unable to rule out other upper GI source contributing such as PUD, AVMs, malignancy vs colonic AVMs, polyps, malignancy. At this point, we will start IV PPI BID, repeat CBC today, and continue to monitor her closely. Recommend EGD and colonoscopy once stable from a medical standpoint (improved respiratory status and INR).   Nausea/vomiting/dysphagia:  New onset nausea/vomiting since her acute illness with influenza diagnosed on 1/1.  Seems that symptoms are primarily posttussive.  Interestingly, patient and nursing staff to note worsening cough after meals.  Query whether patient is having a degree of aspiration.  This could also be a presentation of GERD as she reports feeling items were coming back up her esophagus today which also triggered cough.  I will order SLP evaluation and also start IV PPI twice daily.  Dysphagia:  May have esophageal as well as oropharyngeal dysphagia.  Notes chronic pill dysphagia which seems to be worsening  recently but also having increased cough after meals raising question of aspiration.  Will  order SLP evaluation and also plan for upper endoscopy sometime this admission once improved from a respiratory/acute illness standpoint.  Chronic constipation:  Will start her on lactulose  15 mL twice daily.   MASH Cirrhosis:  MELD 3.0 is 39 based on yesterday's labs. No INR for HFP today. Follows with [facility]. Previously stated likely not a transplant candidate considering other comorbidities.  No history of esophageal varices with last EGD 07/2022.  No evidence of ascites or peripheral edema on exam today.  Mental status is altered, but ammonia level was within normal limits on admission. HCC screening up to date in December 2024.    Plan: CBC today and daily thereafter.  Transfuse for Hgb <7.  INR, HFP, renal panel tomorrow and daily thereafter.  Start Protonix  40 mg IV BID  SLP evaluation for postprandial cough/?component of aspiration.  Consider esophagram when mentation improved for dysphagia. EDG and colonoscopy once clinically improved from respiratory/acute illness standpoint and INR <2.  Start Lactulose  15 ml BID for chronic constipation.    LOS: 2 days    08/06/2023, 2:37 PM   Josette Centers, [facility]

## 2023-08-06 NOTE — Progress Notes (Addendum)
 Dr.Courage notified of pt.coughing after PO intake and concern from pt.and family/RN that she made need another speech eval. Message received no new orders thus far. MD into see pt.   Dr.Courage notified that pt.is coughing up bits of food. Dr.Courage responds that message received with thumbs up.

## 2023-08-06 NOTE — Progress Notes (Signed)
 Dr.Courage notified of pt's stool being positive for blood.

## 2023-08-06 NOTE — Progress Notes (Signed)
 IV noted to have scant blood under dressing slightly. IV flushed and draws back blood, no leaking noted when flushed.  Due to pt's RUE restriction and previous IV infiltration at outside hospital decision made to not risk losing good IV just to update dressing. Pt.agrees. IV closely monitored, no new leaking or drainage noted.

## 2023-08-06 NOTE — Plan of Care (Signed)
  Problem: Education: Goal: Ability to describe self-care measures that may prevent or decrease complications (Diabetes Survival Skills Education) will improve Outcome: Progressing   Problem: Health Behavior/Discharge Planning: Goal: Ability to manage health-related needs will improve Outcome: Progressing   Problem: Education: Goal: Knowledge of General Education information will improve Description: Including pain rating scale, medication(s)/side effects and non-pharmacologic comfort measures Outcome: Progressing

## 2023-08-06 NOTE — Progress Notes (Signed)
 PROGRESS NOTE  [name] [name], is a 73 y.o. female, DOB - 11-Nov-1950, FMW:[id number]  Admit date - 08/04/2023   Admitting Physician Mario Tobie GAILS, MD  Outpatient Primary MD for the patient is Rolinda Millman, MD  LOS - 2  Chief Complaint  Patient presents with   Failure To Thrive      Brief Narrative:  -73 y.o. 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 admitted on 08/04/2023 with acute metabolic encephalopathy and generalized weakness and deconditioning    -Assessment and Plan: 1)acute metabolic encephalopathy and generalized weakness and deconditioning-- -multifactorial in the setting of influenza infection, NASH liver cirrhosis,  ESRD with missed hemodialysis sessions and OSA with chronic hypercapnia with intolerance of BiPAP/NIV --- Use BiPAP/NIV as tolerated -Nephrology consult for hemodialysis -Supportive care for influenza--patient did not tolerate Tamiflu 08/06/23 -- Mentation appears to be back to baseline according to patient's husband who is at bedside  2)ESRD----M/Tu/Th/F schedule----as outpatient -last HD prior to this admission 07/31/2023 -Patient usually gets hemodialysis at least 4 times a week as outpt -Nephrology consult requested for HD--nephrologist recommends MWF schedule while inpatient -Had hemodialysis via 08/05/2023 -Nephrology team plans to repeat HD on 08/07/2023  3)Liver cirrhosis secondary to NASH (HCC) -Serum ammonia is not elevated -INR is 4.0 watch closely  4)Acute on chronic chronic anemia of ESRD/?? ABLA superimposed on chronic anemia of ESRD =-Hgb  11.4 >> 9.4  -Serum iron  and ferritin are not low On 08/06/23 she had Had dark stool with some burgundy outline---Hemoccult blood is positive ----Holding Eliquis  for now---Last Dose of Eliquis  08/04/23 at 0900  am -on 07/31/23 she was seen by GI at [facility] (Dr. Shila) and she refused procedures  -Gi input/consult at [facility] requested -Defer decision on ESA/Procrit  to nephrology team  5)PAFib/s/p PPM--Eliquis  on hold due to bleeding concerns in the setting of liver cirrhosis with elevated INR--please see #4 above -Last Dose of Eliquis  08/04/23 at 0900 am -- Metoprolol  for rate control  6)Chronic diastolic dysfunction CHF/HFpEF--- continue Toprol -XL -Use hemodialysis sessions to address volume status -Next HD 08/07/2023  7)CAD history of CABG, multiple PCI / HLD:  -Continue pravastatin  and aspirin   --Eliquis  on hold as above #5 (Last Dose of Eliquis  08/04/23 at 0900 am) ---- Patient gets outpatient Repatha  and Vascepa   8)DM2-A1c 6.7 reflecting good diabetic control PTA --Hold PTA insulin  pump-- -c/n  Semglee  3 units nightly -Sliding scale with Apidra  as ordered -Diabetic educator input appreciated  9)Generalized Weakness and Deconditioning/FTT--- physical therapy eval appreciated recommends SNF rehab -- Husband is not sure that they want to go to SNF rehab -  History RCC, right nephrectomy: Outpatient surveillance Remote history lymphoma: outpatient surveillance H/o Breast Ca/DCIS--previously treated  Status is: Inpatient   Disposition: The patient is from: Home              Anticipated d/c is to: Home              Anticipated d/c date is: 2 days              Patient currently is not medically stable to d/c. Barriers: Not Clinically Stable-   Code Status :  -  Code Status: Full Code   Family Communication:   (patient is alert, awake and coherent)  Discussed with Husband at bedside  DVT Prophylaxis  :   -  SCDs /subcu heparin  for DVT prophylaxis while Eliquis  is on hold ---Last Dose of Eliquis  08/04/23 at 0900 am  Lab Results  Component Value Date   PLT 209 08/05/2023   Inpatient Medications  Scheduled Meds:  aspirin  EC  81 mg Oral Q breakfast   Chlorhexidine  Gluconate Cloth  6 each  Topical Daily   Chlorhexidine  Gluconate Cloth  6 each Topical Q0600   gabapentin   100 mg Oral QHS   insulin  glargine-yfgn  3 Units Subcutaneous Daily   insulin  glulisine  0-5 Units Subcutaneous QHS   insulin  glulisine  0-9 Units Subcutaneous TID WC   insulin  glulisine  2 Units Subcutaneous TID WC   metoprolol  tartrate  12.5 mg Oral BID   pravastatin   20 mg Oral q1800   sodium chloride  flush  3 mL Intravenous Q12H   sodium chloride  flush  3 mL Intravenous Q12H   Continuous Infusions:  doxycycline  (VIBRAMYCIN ) IV 100 mg (08/06/23 0902)   PRN Meds:.alteplase , guaiFENesin -dextromethorphan , heparin , ondansetron  (ZOFRAN ) IV, mouth rinse, phenol, sodium chloride  flush   Anti-infectives (From admission, onward)    Start     Dose/Rate Route Frequency Ordered Stop   08/05/23 0000  doxycycline  (VIBRAMYCIN ) 100 mg in dextrose  5 % 250 mL IVPB        100 mg 125 mL/hr over 120 Minutes Intravenous Every 12 hours 08/04/23 2312     08/04/23 2315  doxycycline  (VIBRAMYCIN ) 200 mg in dextrose  5 % 250 mL IVPB  Status:  Discontinued        200 mg 125 mL/hr over 120 Minutes Intravenous  Once 08/04/23 2312 08/04/23 2314      Subjective: [name] [name] today has no fevers,  No chest pain,   -Husband at bedside, questions answered -Complains of nausea but no emesis -Had some cough after oral intake earlier--- no distress Had dark stool with some burgundy outline  Objective: Vitals:   08/06/23 1107 08/06/23 1155 08/06/23 1157 08/06/23 1200  BP:  (!) 91/47 (!) 102/32 (!) 107/30  Pulse:  100 100 98  Resp:  15 15 15   Temp: 97.9 F (36.6 C)     TempSrc: Oral     SpO2:  99% 100% 98%  Weight:      Height:        Intake/Output Summary (Last 24 hours) at 08/06/2023 1415 Last data filed at 08/06/2023 1100 Gross per 24 hour  Intake 691.51 ml  Output 100 ml  Net 591.51 ml   Filed Weights   08/05/23 0030 08/05/23 0326 08/06/23 0358  Weight: 46.6 kg 46.6 kg 47.7 kg    Physical Exam Gen:- Awake  Alert, chronically ill-appearing  HEENT:- [place].AT, No sclera icterus, Rt internal jugu Neck-Supple Neck, Rt  IJ HD cath in situ Lungs-no wheezing, fair symmetrical air movement CV- S1, S2 normal, regular , prior CABG scar, pacemaker in situ Abd-  +ve B.Sounds, Abd Soft, No tenderness,    Extremity/Skin:- No  edema, pedal pulses present  Psych-affect is appropriate, oriented x3 Neuro-generalized weakness, no new focal deficits, no tremors MSK--right upper extremity AV fistula with thrill and bruit  Data Reviewed: I have personally reviewed following labs and imaging studies  CBC: Recent Labs  Lab 07/30/23 1530 07/31/23 0454 08/01/23 0829 08/04/23 1600 08/05/23 0323  WBC 7.8 10.0 10.9* 10.6* 12.3*  NEUTROABS  --  7.1  --  8.4*  --   HGB 11.4* 11.3* 10.6* 9.5* 9.4*  HCT 33.7* 33.2* 30.9* 28.0* 25.8*  MCV 91.6 91.2 90.1 91.2 92.5  PLT  120* 128* 148* 186 209   Basic Metabolic Panel: Recent Labs  Lab 07/31/23 0454 08/01/23 0459 08/04/23 1600 08/05/23 0323 08/06/23 0345  NA 126* 129* 128* 130* 128*  K 3.8 3.8 3.8 4.0 3.8  CL 91* 93* 93* 95* 91*  CO2 22 24 24  21* 19*  GLUCOSE 174* 185* 202* 193* 350*  BUN 52* 37* 85* 91* 33*  CREATININE 3.52* 3.93* 2.93* 2.79* 1.38*  CALCIUM  9.2 9.6 9.6 9.4 9.1  PHOS 4.0 3.9  --   --  2.6   GFR: Estimated Creatinine Clearance: 28.6 mL/min (A) (by C-G formula based on SCr of 1.38 mg/dL (H)). Liver Function Tests: Recent Labs  Lab 07/31/23 0454 08/01/23 0459 08/04/23 1600 08/05/23 0323 08/06/23 0345  AST  --   --  24 21  --   ALT  --   --  16 14  --   ALKPHOS  --   --  241* 197*  --   BILITOT  --   --  2.0* 2.2*  --   PROT  --   --  7.0 6.5  --   ALBUMIN  3.0* 3.5 3.0* 2.8* 3.6   Cardiac Enzymes: Recent Labs  Lab 08/04/23 2148  CKTOTAL 18*   Recent Results (from the past 240 hours)  Blood Culture (routine x 2)     Status: None (Preliminary result)   Collection Time: 08/04/23  4:00 PM   Specimen: Left Antecubital; Blood   Result Value Ref Range Status   Specimen Description   Final    LEFT ANTECUBITAL BOTTLES DRAWN AEROBIC AND ANAEROBIC   Special Requests Blood Culture adequate volume  Final   Culture   Final    NO GROWTH 2 DAYS Performed at [facility], [address] [place], [place] [address]    Report Status PENDING  Incomplete  Blood Culture (routine x 2)     Status: None (Preliminary result)   Collection Time: 08/04/23  4:00 PM   Specimen: BLOOD LEFT ARM  Result Value Ref Range Status   Specimen Description BLOOD LEFT ARM BOTTLES DRAWN AEROBIC AND ANAEROBIC  Final   Special Requests Blood Culture adequate volume  Final   Culture   Final    NO GROWTH 2 DAYS Performed at [facility], [address], [place], [place] [address]    Report Status PENDING  Incomplete  MRSA Next Gen by PCR, Nasal     Status: None   Collection Time: 08/05/23 12:19 AM   Specimen: Nasal Mucosa; Nasal Swab  Result Value Ref Range Status   MRSA by PCR Next Gen NOT DETECTED NOT DETECTED Final    Comment: (NOTE) The GeneXpert MRSA Assay (FDA approved for NASAL specimens only), is one component of a comprehensive MRSA colonization surveillance program. It is not intended to diagnose MRSA infection nor to guide or monitor treatment for MRSA infections. Test performance is not FDA approved in patients less than 54 years old. Performed at [facility], [address] [place], [place] [address]     Radiology Studies: CT Chest Wo Contrast Result Date: 08/04/2023 CLINICAL DATA:  Pneumonia, complication suspected, xray done Failure to thrive. EXAM: CT CHEST WITHOUT CONTRAST TECHNIQUE: Multidetector CT imaging of the chest was performed following the standard protocol without IV contrast. RADIATION DOSE REDUCTION: This exam was performed according to the departmental dose-optimization program which includes automated exposure control, adjustment of the mA and/or kV according to patient size and/or use of iterative reconstruction  technique. COMPARISON:  Radiograph earlier  today. CT 5 days ago 07/30/2023. Chest CT 08/10/2022 also reviewed FINDINGS: Cardiovascular: Dialysis catheter tip in the right atrium. The heart is normal in size. Dense calcification of native coronary arteries. Prosthetic aortic valve. Dense mitral annulus calcifications. Densely calcified thoracic aorta without aneurysm. Left-sided pacemaker with lead tips in the right atrium and ventricle. No significant pericardial effusion. Mediastinum/Nodes: Technically limited assessment in the absence of IV contrast and paucity of mediastinal fat. There is scattered small mediastinal lymph nodes, none of which are enlarged by size criteria. Hilar assessment is limited on this unenhanced exam. The esophagus is decompressed. Lungs/Pleura: Loculated right pleural effusion with fluid tracking into the minor fissure as well as anterior laterally, without significant interval change. There is also a small medial component at the right lung base associated compressive atelectasis throughout the right lung. Nodular density at the right lung apex measures 13 mm, without significant interval change from exam last year, likely scarring. Additional left apical pleuroparenchymal scarring is similar. There is overall improvement in the multifocal ground-glass opacities in the left lung consistent with resolving pneumonia. Mild residual patchy and nodular areas of ground-glass persist. Greatest residual is in the left lower lobe measuring 11 mm series 3, image 71. In area of ill-defined increased density within the basilar right lower lobe is unchanged over the last 5 days. Right lower lobe mucoid impaction within many segmental bronchi. Upper Abdomen: Gallstones. Posterior left renal cyst is unchanged from 2023. No further follow-up imaging is recommended. Musculoskeletal: Stable over the last 5 days. Minor T7 superior endplate compression deformity. Median sternotomy. IMPRESSION: 1.  Moderate-sized loculated pleural effusion is unchanged over the last 5 days. There is adjacent compressive atelectasis throughout the right lung. 2. Improvement in patchy areas of ground-glass likely resolving pneumonia. Areas of ground-glass persist, recommend CT follow-up to resolution after course of treatment. CT in 3 months would be reasonable. 3. Persistent area of increased density at the right lung base. This may represent an area of chronic atelectasis/aspiration, however is mention previously, difficult to exclude neoplasm. Consider pulmonary follow-up and outpatient PET CT as clinically indicated. 4. Stable area of nodular thickening at the right lung apex, favored to be scarring. 5. Additional stable chronic findings as described. Aortic Atherosclerosis (ICD10-I70.0). Electronically Signed   By: Andrea Gasman M.D.   On: 08/04/2023 18:40   DG Chest Port 1 View Result Date: 08/04/2023 CLINICAL DATA:  Questionable sepsis - evaluate for abnormality Failure to thrive. EXAM: PORTABLE CHEST 1 VIEW COMPARISON:  Radiograph 07/29/2023, CT 07/30/2023 FINDINGS: Right-sided dialysis catheter remains in place. Left-sided pacemaker in place. Prior median sternotomy with prosthetic aortic valve. Loculated right pleural effusion with questionable increase from prior exam. Associated opacity in the right hemithorax is without significant interval change. Areas of ground-glass consolidation in the left lung on prior CT are not well demonstrated by radiograph. No pneumothorax. No convincing pulmonary edema. IMPRESSION: 1. Stable radiographic appearance of the chest. 2. Loculated right pleural effusion and opacities in the right hemithorax are unchanged over the last 6 days. 3. Additional patchy airspace disease on prior CT have no definite radiographic correlate. Electronically Signed   By: Andrea Gasman M.D.   On: 08/04/2023 16:06   Scheduled Meds:  aspirin  EC  81 mg Oral Q breakfast   Chlorhexidine  Gluconate  Cloth  6 each Topical Daily   Chlorhexidine  Gluconate Cloth  6 each Topical Q0600   gabapentin   100 mg Oral QHS   insulin  glargine-yfgn  3 Units Subcutaneous Daily  insulin  glulisine  0-5 Units Subcutaneous QHS   insulin  glulisine  0-9 Units Subcutaneous TID WC   insulin  glulisine  2 Units Subcutaneous TID WC   metoprolol  tartrate  12.5 mg Oral BID   pravastatin   20 mg Oral q1800   sodium chloride  flush  3 mL Intravenous Q12H   sodium chloride  flush  3 mL Intravenous Q12H   Continuous Infusions:  doxycycline  (VIBRAMYCIN ) IV 100 mg (08/06/23 0902)    LOS: 2 days   Rendall Carwin M.D on 08/06/2023 at 2:15 PM  Go to [email or web address] - for contact info  [facility] - Office  [phone]  If 7PM-7AM, please contact night-coverage [email or web address] 08/06/2023, 2:15 PM

## 2023-08-06 NOTE — Progress Notes (Signed)
 Dr.Courage notified of pt's need for PRN for nausea.

## 2023-08-06 NOTE — Progress Notes (Signed)
 Stool Occult result Positive, Dr Marisa Severin made aware at [phone].

## 2023-08-06 NOTE — Progress Notes (Signed)
 Patient ID: [name] [name], female   DOB: 1951-05-05, 73 y.o.   MRN: [id number] S: No new complaints.  Still feels fatigued and weak. O:BP (!) 106/59   Pulse 99   Temp 97.9 F (36.6 C) (Oral)   Resp 19   Ht 5' 2 (1.575 m)   Wt 47.7 kg   SpO2 99%   BMI 19.23 kg/m   Intake/Output Summary (Last 24 hours) at 08/06/2023 1150 Last data filed at 08/06/2023 0300 Gross per 24 hour  Intake 1287.71 ml  Output 0 ml  Net 1287.71 ml   Intake/Output: I/O last 3 completed shifts: In: 1367.7 [P.O.:500; I.V.:136.2; IV Piggyback:731.5] Out: 0   Intake/Output this shift:  No intake/output data recorded. Weight change: 1.9 kg Gen: NAD CVS: RRR Resp:CTA Abd: +BS, soft, NT/ND Ext: no edema  Recent Labs  Lab 07/31/23 0454 08/01/23 0459 08/04/23 1600 08/05/23 0323  NA 126* 129* 128* 130*  K 3.8 3.8 3.8 4.0  CL 91* 93* 93* 95*  CO2 22 24 24  21*  GLUCOSE 174* 185* 202* 193*  BUN 52* 37* 85* 91*  CREATININE 3.52* 3.93* 2.93* 2.79*  ALBUMIN  3.0* 3.5 3.0* 2.8*  CALCIUM  9.2 9.6 9.6 9.4  PHOS 4.0 3.9  --   --   AST  --   --  24 21  ALT  --   --  16 14   Liver Function Tests: Recent Labs  Lab 08/01/23 0459 08/04/23 1600 08/05/23 0323  AST  --  24 21  ALT  --  16 14  ALKPHOS  --  241* 197*  BILITOT  --  2.0* 2.2*  PROT  --  7.0 6.5  ALBUMIN  3.5 3.0* 2.8*   No results for input(s): LIPASE, AMYLASE in the last 168 hours. Recent Labs  Lab 08/04/23 2148  AMMONIA 35   CBC: Recent Labs  Lab 07/30/23 1530 07/31/23 0454 08/01/23 0829 08/04/23 1600 08/05/23 0323  WBC 7.8 10.0 10.9* 10.6* 12.3*  NEUTROABS  --  7.1  --  8.4*  --   HGB 11.4* 11.3* 10.6* 9.5* 9.4*  HCT 33.7* 33.2* 30.9* 28.0* 25.8*  MCV 91.6 91.2 90.1 91.2 92.5  PLT 120* 128* 148* 186 209   Cardiac Enzymes: Recent Labs  Lab 08/04/23 2148  CKTOTAL 18*   CBG: Recent Labs  Lab 08/05/23 1115 08/05/23 1618 08/05/23 2215 08/06/23 0758 08/06/23 1059  GLUCAP 180* 198* 149* 182* 242*    Iron   Studies:  Recent Labs    08/06/23 0345  IRON  56  TIBC 161*  FERRITIN 2,002*   Studies/Results: CT Chest Wo Contrast Result Date: 08/04/2023 CLINICAL DATA:  Pneumonia, complication suspected, xray done Failure to thrive. EXAM: CT CHEST WITHOUT CONTRAST TECHNIQUE: Multidetector CT imaging of the chest was performed following the standard protocol without IV contrast. RADIATION DOSE REDUCTION: This exam was performed according to the departmental dose-optimization program which includes automated exposure control, adjustment of the mA and/or kV according to patient size and/or use of iterative reconstruction technique. COMPARISON:  Radiograph earlier today. CT 5 days ago 07/30/2023. Chest CT 08/10/2022 also reviewed FINDINGS: Cardiovascular: Dialysis catheter tip in the right atrium. The heart is normal in size. Dense calcification of native coronary arteries. Prosthetic aortic valve. Dense mitral annulus calcifications. Densely calcified thoracic aorta without aneurysm. Left-sided pacemaker with lead tips in the right atrium and ventricle. No significant pericardial effusion. Mediastinum/Nodes: Technically limited assessment in the absence of IV contrast and paucity of mediastinal fat. There is scattered small mediastinal lymph  nodes, none of which are enlarged by size criteria. Hilar assessment is limited on this unenhanced exam. The esophagus is decompressed. Lungs/Pleura: Loculated right pleural effusion with fluid tracking into the minor fissure as well as anterior laterally, without significant interval change. There is also a small medial component at the right lung base associated compressive atelectasis throughout the right lung. Nodular density at the right lung apex measures 13 mm, without significant interval change from exam last year, likely scarring. Additional left apical pleuroparenchymal scarring is similar. There is overall improvement in the multifocal ground-glass opacities in the left  lung consistent with resolving pneumonia. Mild residual patchy and nodular areas of ground-glass persist. Greatest residual is in the left lower lobe measuring 11 mm series 3, image 71. In area of ill-defined increased density within the basilar right lower lobe is unchanged over the last 5 days. Right lower lobe mucoid impaction within many segmental bronchi. Upper Abdomen: Gallstones. Posterior left renal cyst is unchanged from 2023. No further follow-up imaging is recommended. Musculoskeletal: Stable over the last 5 days. Minor T7 superior endplate compression deformity. Median sternotomy. IMPRESSION: 1. Moderate-sized loculated pleural effusion is unchanged over the last 5 days. There is adjacent compressive atelectasis throughout the right lung. 2. Improvement in patchy areas of ground-glass likely resolving pneumonia. Areas of ground-glass persist, recommend CT follow-up to resolution after course of treatment. CT in 3 months would be reasonable. 3. Persistent area of increased density at the right lung base. This may represent an area of chronic atelectasis/aspiration, however is mention previously, difficult to exclude neoplasm. Consider pulmonary follow-up and outpatient PET CT as clinically indicated. 4. Stable area of nodular thickening at the right lung apex, favored to be scarring. 5. Additional stable chronic findings as described. Aortic Atherosclerosis (ICD10-I70.0). Electronically Signed   By: Andrea Gasman M.D.   On: 08/04/2023 18:40   DG Chest Port 1 View Result Date: 08/04/2023 CLINICAL DATA:  Questionable sepsis - evaluate for abnormality Failure to thrive. EXAM: PORTABLE CHEST 1 VIEW COMPARISON:  Radiograph 07/29/2023, CT 07/30/2023 FINDINGS: Right-sided dialysis catheter remains in place. Left-sided pacemaker in place. Prior median sternotomy with prosthetic aortic valve. Loculated right pleural effusion with questionable increase from prior exam. Associated opacity in the right  hemithorax is without significant interval change. Areas of ground-glass consolidation in the left lung on prior CT are not well demonstrated by radiograph. No pneumothorax. No convincing pulmonary edema. IMPRESSION: 1. Stable radiographic appearance of the chest. 2. Loculated right pleural effusion and opacities in the right hemithorax are unchanged over the last 6 days. 3. Additional patchy airspace disease on prior CT have no definite radiographic correlate. Electronically Signed   By: Andrea Gasman M.D.   On: 08/04/2023 16:06    aspirin  EC  81 mg Oral Q breakfast   Chlorhexidine  Gluconate Cloth  6 each Topical Daily   Chlorhexidine  Gluconate Cloth  6 each Topical Q0600   gabapentin   100 mg Oral QHS   insulin  glargine-yfgn  3 Units Subcutaneous Daily   insulin  glulisine  0-5 Units Subcutaneous QHS   insulin  glulisine  0-9 Units Subcutaneous TID WC   insulin  glulisine  2 Units Subcutaneous TID WC   metoprolol  tartrate  12.5 mg Oral BID   pravastatin   20 mg Oral q1800   sodium chloride  flush  3 mL Intravenous Q12H   sodium chloride  flush  3 mL Intravenous Q12H    BMET    Component Value Date/Time   NA 130 (L) 08/05/2023 9676  NA 142 04/16/2017 1031   NA 142 12/13/2016 1427   K 4.0 08/05/2023 0323   K 4.4 12/13/2016 1427   CL 95 (L) 08/05/2023 0323   CO2 21 (L) 08/05/2023 0323   CO2 28 12/13/2016 1427   GLUCOSE 193 (H) 08/05/2023 0323   GLUCOSE 107 12/13/2016 1427   BUN 91 (H) 08/05/2023 0323   BUN 30 (H) 04/16/2017 1031   BUN 33.3 (H) 12/13/2016 1427   CREATININE 2.79 (H) 08/05/2023 0323   CREATININE 1.63 (H) 11/07/2017 1406   CREATININE 1.3 (H) 12/13/2016 1427   CALCIUM  9.4 08/05/2023 0323   CALCIUM  10.5 (H) 12/31/2018 1306   CALCIUM  10.3 12/13/2016 1427   GFRNONAA 18 (L) 08/05/2023 0323   GFRNONAA 33 (L) 11/07/2017 1406   GFRAA 34 (L) 04/25/2020 1015   GFRAA 38 (L) 11/07/2017 1406   CBC    Component Value Date/Time   WBC 12.3 (H) 08/05/2023 0323   RBC 2.79 (L)  08/05/2023 0323   HGB 9.4 (L) 08/05/2023 0323   HGB 12.4 09/16/2019 1533   HGB 13.4 12/13/2016 1427   HCT 25.8 (L) 08/05/2023 0323   HCT 38.4 09/16/2019 1533   HCT 40.3 12/13/2016 1427   PLT 209 08/05/2023 0323   PLT 120 (L) 09/16/2019 1533   MCV 92.5 08/05/2023 0323   MCV 92 09/16/2019 1533   MCV 88.8 12/13/2016 1427   MCH 33.7 08/05/2023 0323   MCHC 36.4 (H) 08/05/2023 0323   RDW 14.3 08/05/2023 0323   RDW 13.2 09/16/2019 1533   RDW 14.6 (H) 12/13/2016 1427   LYMPHSABS 1.0 08/04/2023 1600   LYMPHSABS 1.2 12/13/2016 1427   MONOABS 1.1 (H) 08/04/2023 1600   MONOABS 0.8 12/13/2016 1427   EOSABS 0.0 08/04/2023 1600   EOSABS 0.2 12/13/2016 1427   BASOSABS 0.1 08/04/2023 1600   BASOSABS 0.1 12/13/2016 1427   -outpatient HD orders: [facility]. 3 hours.  TDC.  Flow rates: 400/8.3.  EDW 46 kg   3K, lactate 45.  Heparin : None.  Meds: Mircera 75 mcg every 4 weeks.  Assessment/Plan: ESRD -Will keep on MWF schedule for HD while admitted, HD today Lethargy/generalized weakness -per primary service Volume/ hypertension -will keep EDW at 46kg, will likely need further lowering of edw, will UF as tolerated  Anemia of Chronic Kidney Disease -Hemoglobin 9.4. Will check iron  panel.  Transfuse PRN for Hgb <7 Secondary Hyperparathyroidism/Hyperphosphatemia - continue with home meds  Loculated right pleural effusion  -per primary -receiving doxycycline   PAF -eliquis  on hold given elevated INR, per primary  DM1 -pump on hold, per primary  Disposition - pt was not able to get back and forth to HD after discharge on 08/01/23.  She has had FTT and has not improved much.  Will likely require SNF placement.   Discussed with husband at the bedside.   Fairy RONAL Sellar, MD [employer] [phone]

## 2023-08-07 DIAGNOSIS — D649 Anemia, unspecified: Secondary | ICD-10-CM | POA: Diagnosis not present

## 2023-08-07 DIAGNOSIS — Z950 Presence of cardiac pacemaker: Secondary | ICD-10-CM | POA: Diagnosis not present

## 2023-08-07 DIAGNOSIS — K5909 Other constipation: Secondary | ICD-10-CM

## 2023-08-07 DIAGNOSIS — N186 End stage renal disease: Secondary | ICD-10-CM | POA: Diagnosis not present

## 2023-08-07 DIAGNOSIS — K7581 Nonalcoholic steatohepatitis (NASH): Secondary | ICD-10-CM

## 2023-08-07 DIAGNOSIS — R5383 Other fatigue: Secondary | ICD-10-CM | POA: Diagnosis not present

## 2023-08-07 DIAGNOSIS — I480 Paroxysmal atrial fibrillation: Secondary | ICD-10-CM

## 2023-08-07 DIAGNOSIS — Z992 Dependence on renal dialysis: Secondary | ICD-10-CM

## 2023-08-07 DIAGNOSIS — R1310 Dysphagia, unspecified: Secondary | ICD-10-CM | POA: Diagnosis not present

## 2023-08-07 DIAGNOSIS — R627 Adult failure to thrive: Secondary | ICD-10-CM | POA: Diagnosis not present

## 2023-08-07 DIAGNOSIS — E1059 Type 1 diabetes mellitus with other circulatory complications: Secondary | ICD-10-CM

## 2023-08-07 DIAGNOSIS — E440 Moderate protein-calorie malnutrition: Secondary | ICD-10-CM

## 2023-08-07 DIAGNOSIS — K7460 Unspecified cirrhosis of liver: Secondary | ICD-10-CM

## 2023-08-07 DIAGNOSIS — K625 Hemorrhage of anus and rectum: Secondary | ICD-10-CM

## 2023-08-07 LAB — RENAL FUNCTION PANEL
Albumin: 3.3 g/dL — ABNORMAL LOW (ref 3.5–5.0)
Anion gap: 13 (ref 5–15)
BUN: 57 mg/dL — ABNORMAL HIGH (ref 8–23)
CO2: 24 mmol/L (ref 22–32)
Calcium: 9.8 mg/dL (ref 8.9–10.3)
Chloride: 92 mmol/L — ABNORMAL LOW (ref 98–111)
Creatinine, Ser: 2.3 mg/dL — ABNORMAL HIGH (ref 0.44–1.00)
GFR, Estimated: 22 mL/min — ABNORMAL LOW (ref >60)
Glucose, Bld: 190 mg/dL — ABNORMAL HIGH (ref 70–99)
Phosphorus: 3.5 mg/dL (ref 2.5–4.6)
Potassium: 3.7 mmol/L (ref 3.5–5.1)
Sodium: 129 mmol/L — ABNORMAL LOW (ref 135–145)

## 2023-08-07 LAB — CBC
HCT: 21.8 % — ABNORMAL LOW (ref 36.0–46.0)
Hemoglobin: 7 g/dL — ABNORMAL LOW (ref 12.0–15.0)
MCH: 30.6 pg (ref 26.0–34.0)
MCHC: 32.1 g/dL (ref 30.0–36.0)
MCV: 95.2 fL (ref 80.0–100.0)
Platelets: 215 10*3/uL (ref 150–400)
RBC: 2.29 MIL/uL — ABNORMAL LOW (ref 3.87–5.11)
RDW: 14.4 % (ref 11.5–15.5)
WBC: 11.3 10*3/uL — ABNORMAL HIGH (ref 4.0–10.5)
nRBC: 0 % (ref 0.0–0.2)

## 2023-08-07 LAB — TROPONIN I (HIGH SENSITIVITY): Troponin I (High Sensitivity): 30 ng/L — ABNORMAL HIGH (ref <18)

## 2023-08-07 LAB — HEPATIC FUNCTION PANEL
ALT: 11 U/L (ref 0–44)
AST: 23 U/L (ref 15–41)
Albumin: 3.4 g/dL — ABNORMAL LOW (ref 3.5–5.0)
Alkaline Phosphatase: 155 U/L — ABNORMAL HIGH (ref 38–126)
Bilirubin, Direct: 1 mg/dL — ABNORMAL HIGH (ref 0.0–0.2)
Indirect Bilirubin: 1.4 mg/dL — ABNORMAL HIGH (ref 0.3–0.9)
Total Bilirubin: 2.4 mg/dL — ABNORMAL HIGH (ref 0.0–1.2)
Total Protein: 7 g/dL (ref 6.5–8.1)

## 2023-08-07 LAB — BASIC METABOLIC PANEL
Anion gap: 9 (ref 5–15)
BUN: 28 mg/dL — ABNORMAL HIGH (ref 8–23)
CO2: 26 mmol/L (ref 22–32)
Calcium: 9.2 mg/dL (ref 8.9–10.3)
Chloride: 94 mmol/L — ABNORMAL LOW (ref 98–111)
Creatinine, Ser: 1.5 mg/dL — ABNORMAL HIGH (ref 0.44–1.00)
GFR, Estimated: 37 mL/min — ABNORMAL LOW (ref >60)
Glucose, Bld: 183 mg/dL — ABNORMAL HIGH (ref 70–99)
Potassium: 3.3 mmol/L — ABNORMAL LOW (ref 3.5–5.1)
Sodium: 129 mmol/L — ABNORMAL LOW (ref 135–145)

## 2023-08-07 LAB — HEMOGLOBIN AND HEMATOCRIT, BLOOD
HCT: 21.1 % — ABNORMAL LOW (ref 36.0–46.0)
Hemoglobin: 7 g/dL — ABNORMAL LOW (ref 12.0–15.0)

## 2023-08-07 LAB — GLUCOSE, CAPILLARY
Glucose-Capillary: 166 mg/dL — ABNORMAL HIGH (ref 70–99)
Glucose-Capillary: 252 mg/dL — ABNORMAL HIGH (ref 70–99)
Glucose-Capillary: 217 mg/dL — ABNORMAL HIGH (ref 70–99)
Glucose-Capillary: 160 mg/dL — ABNORMAL HIGH (ref 70–99)

## 2023-08-07 LAB — MAGNESIUM: Magnesium: 1.6 mg/dL — ABNORMAL LOW (ref 1.7–2.4)

## 2023-08-07 LAB — PROTIME-INR
INR: 2.2 — ABNORMAL HIGH (ref 0.8–1.2)
Prothrombin Time: 24.5 s — ABNORMAL HIGH (ref 11.4–15.2)

## 2023-08-07 LAB — PREPARE RBC (CROSSMATCH)

## 2023-08-07 LAB — PHOSPHORUS: Phosphorus: 2.5 mg/dL (ref 2.5–4.6)

## 2023-08-07 MED ORDER — AMIODARONE HCL IN DEXTROSE 360-4.14 MG/200ML-% IV SOLN
30.0000 mg/h | INTRAVENOUS | Status: DC
  Filled 2023-08-07 (×2): qty 200

## 2023-08-07 MED ORDER — NEPRO/CARBSTEADY PO LIQD: 237.0000 mL | Freq: Two times a day (BID) | ORAL | Status: DC

## 2023-08-07 MED ORDER — AMIODARONE LOAD VIA INFUSION
150.0000 mg | Freq: Once | INTRAVENOUS | Status: DC
  Filled 2023-08-07: qty 83.34

## 2023-08-07 MED ORDER — SODIUM CHLORIDE 0.9% IV SOLUTION: Freq: Once | INTRAVENOUS | Status: AC

## 2023-08-07 MED ORDER — AMIODARONE HCL IN DEXTROSE 360-4.14 MG/200ML-% IV SOLN
60.0000 mg/h | INTRAVENOUS | Status: DC
  Filled 2023-08-07: qty 200

## 2023-08-07 MED ORDER — ALBUMIN HUMAN 25 % IV SOLN: 25.0000 g | INTRAVENOUS | Status: DC | PRN

## 2023-08-07 MED ORDER — METOPROLOL TARTRATE 5 MG/5ML IV SOLN
2.5000 mg | Freq: Once | INTRAVENOUS | Status: DC
  Filled 2023-08-07: qty 5

## 2023-08-07 NOTE — Progress Notes (Signed)
 Patient ID: [name] [name], female   DOB: [birth year], 73 y.o.   MRN: [id number] S: Feeling a little better this morning. O:BP (!) 113/38   Pulse (!) 105   Temp 98 F (36.7 C) (Oral)   Resp 18   Ht 5\' 2"  (1.575 m)   Wt 47.6 kg   SpO2 96%   BMI 19.19 kg/m   Intake/Output Summary (Last 24 hours) at 08/07/2023 1000 Last data filed at 08/07/2023 0500 Gross per 24 hour  Intake 1237.57 ml  Output 100 ml  Net 1137.57 ml   Intake/Output: I/O last 3 completed shifts: In: 1709.1 [P.O.:800; I.V.:3; IV Piggyback:906.1] Out: 100 [Urine:100]  Intake/Output this shift:  No intake/output data recorded. Weight change: -0.1 kg Gen: NAD CVS: tachy Resp: CTA Abd: +BS, soft, NT/ND Ext: no edema  Recent Labs  Lab 08/01/23 0459 08/04/23 1600 08/05/23 0323 08/06/23 0345 08/07/23 0440  NA 129* 128* 130* 128* 129*  K 3.8 3.8 4.0 3.8 3.7  CL 93* 93* 95* 91* 92*  CO2 24 24 21* 19* 24  GLUCOSE 185* 202* 193* 350* 190*  BUN 37* 85* 91* 33* 57*  CREATININE 3.93* 2.93* 2.79* 1.38* 2.30*  ALBUMIN  3.5 3.0* 2.8* 3.6 3.4*  3.3*  CALCIUM  9.6 9.6 9.4 9.1 9.8  PHOS 3.9  --   --  2.6 3.5  AST  --  24 21  --  23  ALT  --  16 14  --  11   Liver Function Tests: Recent Labs  Lab 08/04/23 1600 08/05/23 0323 08/06/23 0345 08/07/23 0440  AST 24 21  --  23  ALT 16 14  --  11  ALKPHOS 241* 197*  --  155*  BILITOT 2.0* 2.2*  --  2.4*  PROT 7.0 6.5  --  7.0  ALBUMIN  3.0* 2.8* 3.6 3.4*  3.3*   No results for input(s): "LIPASE", "AMYLASE" in the last 168 hours. Recent Labs  Lab 08/04/23 2148  AMMONIA 35   CBC: Recent Labs  Lab 08/01/23 0829 08/04/23 1600 08/05/23 0323 08/06/23 1828 08/07/23 0440  WBC 10.9* 10.6* 12.3* 12.2* 11.3*  NEUTROABS  --  8.4*  --   --   --   HGB 10.6* 9.5* 9.4* 7.1* 7.0*  HCT 30.9* 28.0* 25.8* 21.4* 21.8*  MCV 90.1 91.2 92.5 92.6 95.2  PLT 148* 186 209 201 215   Cardiac Enzymes: Recent Labs  Lab 08/04/23 2148  CKTOTAL 18*   CBG: Recent Labs  Lab  08/06/23 0758 08/06/23 1059 08/06/23 1559 08/06/23 2205 08/07/23 0746  GLUCAP 182* 242* 217* 177* 166*    Iron  Studies:  Recent Labs    08/06/23 0345  IRON  56  TIBC 161*  FERRITIN 2,002*   Studies/Results: No results found.  aspirin  EC  81 mg Oral Q breakfast   Chlorhexidine  Gluconate Cloth  6 each Topical Daily   Chlorhexidine  Gluconate Cloth  6 each Topical Q0600   gabapentin   100 mg Oral QHS   insulin  glargine-yfgn  3 Units Subcutaneous Daily   insulin  glulisine  0-5 Units Subcutaneous QHS   insulin  glulisine  0-9 Units Subcutaneous TID WC   insulin  glulisine  2 Units Subcutaneous TID WC   lactulose   10 g Oral BID   metoprolol  tartrate  12.5 mg Oral BID   pantoprazole  (PROTONIX ) IV  40 mg Intravenous Q12H   pravastatin   20 mg Oral q1800   sodium chloride  flush  3 mL Intravenous Q12H    BMET    Component  Value Date/Time   NA 129 (L) 08/07/2023 0440   NA 142 04/16/2017 1031   NA 142 12/13/2016 1427   K 3.7 08/07/2023 0440   K 4.4 12/13/2016 1427   CL 92 (L) 08/07/2023 0440   CO2 24 08/07/2023 0440   CO2 28 12/13/2016 1427   GLUCOSE 190 (H) 08/07/2023 0440   GLUCOSE 107 12/13/2016 1427   BUN 57 (H) 08/07/2023 0440   BUN 30 (H) 04/16/2017 1031   BUN 33.3 (H) 12/13/2016 1427   CREATININE 2.30 (H) 08/07/2023 0440   CREATININE 1.63 (H) 11/07/2017 1406   CREATININE 1.3 (H) 12/13/2016 1427   CALCIUM  9.8 08/07/2023 0440   CALCIUM  10.5 (H) 12/31/2018 1306   CALCIUM  10.3 12/13/2016 1427   GFRNONAA 22 (L) 08/07/2023 0440   GFRNONAA 33 (L) 11/07/2017 1406   GFRAA 34 (L) 04/25/2020 1015   GFRAA 38 (L) 11/07/2017 1406   CBC    Component Value Date/Time   WBC 11.3 (H) 08/07/2023 0440   RBC 2.29 (L) 08/07/2023 0440   HGB 7.0 (L) 08/07/2023 0440   HGB 12.4 09/16/2019 1533   HGB 13.4 12/13/2016 1427   HCT 21.8 (L) 08/07/2023 0440   HCT 38.4 09/16/2019 1533   HCT 40.3 12/13/2016 1427   PLT 215 08/07/2023 0440   PLT 120 (L) 09/16/2019 1533   MCV 95.2 08/07/2023  0440   MCV 92 09/16/2019 1533   MCV 88.8 12/13/2016 1427   MCH 30.6 08/07/2023 0440   MCHC 32.1 08/07/2023 0440   RDW 14.4 08/07/2023 0440   RDW 13.2 09/16/2019 1533   RDW 14.6 (H) 12/13/2016 1427   LYMPHSABS 1.0 08/04/2023 1600   LYMPHSABS 1.2 12/13/2016 1427   MONOABS 1.1 (H) 08/04/2023 1600   MONOABS 0.8 12/13/2016 1427   EOSABS 0.0 08/04/2023 1600   EOSABS 0.2 12/13/2016 1427   BASOSABS 0.1 08/04/2023 1600   BASOSABS 0.1 12/13/2016 1427    -outpatient HD orders: [facility]. 3 hours.  TDC.  Flow rates: 400/8.3.  EDW 46 kg   3K, lactate 45.  Heparin : None.  Meds: Mircera 75 mcg every 4 weeks.   Assessment/Plan: ESRD -Will keep on MWF schedule for HD while admitted, HD today Lethargy/generalized weakness -per primary service Volume/ hypertension -will keep EDW at 46kg, will likely need further lowering of edw, will UF as tolerated  Anemia of Chronic Kidney Disease -Hemoglobin 9.4. Will check iron  panel.  Transfuse PRN for Hgb <7 Secondary Hyperparathyroidism/Hyperphosphatemia - continue with home meds  Loculated right pleural effusion  -per primary -receiving doxycycline   PAF -eliquis  on hold given elevated INR, per primary  DM1 -pump on hold, per primary  Disposition - pt was not able to get back and forth to HD after discharge on 08/01/23.  She has had FTT and has not improved much.  Will likely require SNF placement.   Discussed with husband at the bedside.  Benjamin Brands, MD [facility]

## 2023-08-07 NOTE — Progress Notes (Signed)
 This RN was called as Engineer, drilling to assist with patient as she was reported to have a rate change while in dialysis and was complaining of CP with some shortness of breath. Pt was in what appeared to be A fib with rate of 120-140. All other vitals were stable. Pt taken back to room, report given to primary RN and Segars MD made aware. EKG was obtained as well as a full set of VS.

## 2023-08-07 NOTE — Procedures (Signed)
   HEMODIALYSIS TREATMENT NOTE:  Pt was brought to [facility] with permission of Dr. Michaelene Admire d/t high HD census. Unable to tolerate UF d/t soft BP.  Pre-HD NSR / ST 95-105.  96/48. HD was initiated with no UF goal.  2 hours into session with no ultrafiltration, pt c/o "chest pressure" mid-sternal, non-radiating. BP 118/41, HR 107 ST.  O2 was applied, Qb was lowered and pt reported resolution of chest pressure.  Six minutes later, HR^^ 130-145, Afib/RVR  99/45.  HD was discontinued at this time after d/w Dr. Edson Graces.  All blood was returned and SWOT nurse kindly transported pt back to ICU for further assessment.  Marykathleen Russi, RN [facility]

## 2023-08-07 NOTE — Progress Notes (Signed)
 Subjective: Patient feeling better today. Sitting up in chair during our encounter. She is a/ox4. Husband at bedside helping provide some info. Patient denies any current GI symptoms today other than some odynophagia/dysphagia. They are not sure they want to have EGD/colonoscopy while inpatient.   Objective: Vital signs in last 24 hours: Temp:  [97.5 F (36.4 C)-98 F (36.7 C)] 98 F (36.7 C) (01/15 0530) Pulse Rate:  [98-114] 105 (01/15 0800) Resp:  [12-25] 18 (01/15 0800) BP: (91-120)/(26-67) 113/38 (01/15 0800) SpO2:  [94 %-100 %] 96 % (01/15 0800) Weight:  [47.6 kg] 47.6 kg (01/15 0530) Last BM Date : 08/06/23 General:   Alert and oriented, pleasant Head:  Normocephalic and atraumatic. Eyes:  No icterus, sclera clear. Conjuctiva pink. Bruising around Left eye Mouth:  Without lesions, mucosa pink and moist.  Neck:  Supple, without thyromegaly or masses.  Heart:  S1, S2 present, no murmurs noted, pacemaker in situ  Lungs: Clear to auscultation bilaterally, without wheezing, rales, or rhonchi.  Abdomen:  Bowel sounds present, soft, non-tender, non-distended. No HSM or hernias noted. No rebound or guarding. No masses appreciated  Extremities:  Without clubbing or edema. Fistula to RUE Neurologic:  Alert and  oriented x4;  grossly normal neurologically. Skin:  Warm and dry, intact without significant lesions.  Psych:  Alert and cooperative. Normal mood and affect.  Intake/Output from previous day: 01/14 0701 - 01/15 0700 In: 1237.6 [P.O.:560; I.V.:3; IV Piggyback:674.6] Out: 100 [Urine:100] Intake/Output this shift: No intake/output data recorded.  Lab Results: Recent Labs    08/05/23 0323 08/06/23 1828 08/07/23 0440  WBC 12.3* 12.2* 11.3*  HGB 9.4* 7.1* 7.0*  HCT 25.8* 21.4* 21.8*  PLT 209 201 215   BMET Recent Labs    08/05/23 0323 08/06/23 0345 08/07/23 0440  NA 130* 128* 129*  K 4.0 3.8 3.7  CL 95* 91* 92*  CO2 21* 19* 24  GLUCOSE 193* 350* 190*  BUN 91*  33* 57*  CREATININE 2.79* 1.38* 2.30*  CALCIUM  9.4 9.1 9.8   LFT Recent Labs    08/04/23 1600 08/05/23 0323 08/06/23 0345 08/07/23 0440  PROT 7.0 6.5  --  7.0  ALBUMIN  3.0* 2.8* 3.6 3.4*  3.3*  AST 24 21  --  23  ALT 16 14  --  11  ALKPHOS 241* 197*  --  155*  BILITOT 2.0* 2.2*  --  2.4*  BILIDIR  --   --   --  1.0*  IBILI  --   --   --  1.4*   PT/INR Recent Labs    08/05/23 0323 08/07/23 0440  LABPROT 36.7* 24.5*  INR 3.7* 2.2*   Hepatitis Panel Recent Labs    08/04/23 1554  HEPBSAG NON REACTIVE    Assessment: [name] [name] is a 73 year old female with history  of  ESRD on HD M/T/TH/F, insulin  pump-dependent diabetes type 1, CAD s/p CABG x 2, s/p bioprosthetic AVR, A-fib on Eliquis , s/p PPM in place, RCC-s/p right nephrectomy, remote history of lymphoma, breast cancer, MASH Liver Cirrhosis following with [facility], prior GI bleeding in the setting of duodenal ulcer (H. pylori positive), recent hospitalization earlier this month with influenza A, pleural effusion, weakness, AMS, now admitted again on 08/04/2023 with acute metabolic encephalopathy and generalized weakness and deconditioning. Also with acute on chronic anemia with episode of rectal bleeding, GI  consulted for further evaluation.    Acute on chronic anemia/GI bleeding:   has had Couple episodes of rectal bleeding with maroon-colored stool  over the last few weeks.  No melena. history of cirrhosis though no varices noted on EGD in January 2024.  Last colonoscopy September 2021 with tubular adenoma.  Capsule endoscopy in 2014 Was Normal.  Denies NSAIDs Other Than 81 Mg ASA.   -hgb down to 7 today, down from 11 earlier this month/9.5 on admission -On Chronic ACs--Eliquis , Last Dose 1/12.  -INR Has Been Elevated this admission as high as 4, but down to 2.2 today.   Etiology of bleeding is unclear.  Patient started on IV PPI twice daily with recommended EGD and colonoscopy once stable from medical standpoint, though  husband is not sure they wish to proceed with this while she is inpatient given she has had no further bleeding. They are open to further discussion of this once she is more medically stable.   Nausea/vomiting/dysphagia/odynophagia: New onset since acute illness with the flu on 1/1.  Patient had worsening cough after meals.  Dysphagia seems worse after coughing as well.  She also endorses odynophagia. SLP evaluation ordered for further evaluation, and to rule out aspiration-this is still pending. No nausea or vomiting today. Recommending EGD once medically stable, as above. Nausea improved today.   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 patient follows with [facility], previously stated likely not a transplant candidate considering other comorbidities.  EV present on last EGD 07/2022.  No ascites/peripheral edema on exam.  She has had AMS, which is improved today. ammonia level normal on admission.  She is up-to-date on [facility] screening as of December 24.   Plan: Trend H&H, would recommend transfusion of 1 unit PRBC today Monitor for overt GI bleeding INR, HFP daily IV PPI twice daily Follow for SLP evaluation/recommendations Could consider esophagram for dysphagia, pending SLP evaluation Plan for EGD/colonoscopy once stable from medical standpoint/ INR less than 2 Continue lactulose  15 mL twice daily for chronic constipation    LOS: 3 days    08/07/2023, 9:08 AM   Kaoru Rezendes L. Calie Buttrey, MSN, APRN, AGNP-C Adult-Gerontology Nurse Practitioner [facility]

## 2023-08-07 NOTE — Plan of Care (Signed)
  Problem: Education: Goal: Ability to describe self-care measures that may prevent or decrease complications (Diabetes Survival Skills Education) will improve Outcome: Progressing Goal: Individualized Educational Video(s) Outcome: Progressing   Problem: Coping: Goal: Ability to adjust to condition or change in health will improve Outcome: Progressing   Problem: Fluid Volume: Goal: Ability to maintain a balanced intake and output will improve Outcome: Progressing   Problem: Health Behavior/Discharge Planning: Goal: Ability to identify and utilize available resources and services will improve Outcome: Progressing Goal: Ability to manage health-related needs will improve Outcome: Progressing   Problem: Metabolic: Goal: Ability to maintain appropriate glucose levels will improve Outcome: Progressing   Problem: Skin Integrity: Goal: Risk for impaired skin integrity will decrease Outcome: Progressing   Problem: Tissue Perfusion: Goal: Adequacy of tissue perfusion will improve Outcome: Progressing   Problem: Education: Goal: Knowledge of General Education information will improve Description: Including pain rating scale, medication(s)/side effects and non-pharmacologic comfort measures Outcome: Progressing   Problem: Health Behavior/Discharge Planning: Goal: Ability to manage health-related needs will improve Outcome: Progressing   Problem: Clinical Measurements: Goal: Ability to maintain clinical measurements within normal limits will improve Outcome: Progressing

## 2023-08-07 NOTE — Significant Event (Addendum)
Notified by RN that patient developed Afib with RVR rates 120-140s associated with chest pain during dialysis. Had undergone 2 hr of clearance only, no ultrafiltration due to borderline hypotension. Stopped due to onset of arrhythmia / chest pain. Evaluated once back in stepdown. Reports improving central chest pain, feeling of heart racing. VS BP 96/59 (66), HR 130-140s, on 2L O2 with sat 100%, RR 19. Tachycardic and irregular, lung fields clear. Data review, downtrending Hb to 7 this AM. Has been taken off anticoagulation due to concern for GI bleeding. Has been borderline hypotensive, receiving Midodrone, metoprolol for rate control. Prior EKGs with sinus tach in recent days. Today EKG reviewed shows SVT likely afib with RVR rate 140, LAD, LAFB, diffuse ST depression and aVR elevation likely ischemic changes.   A/P   Afib with RVR, associated ischemic EKG changes  Borderline hypotension  - Start Amiodarone 150 mg IV followed by gtt.  - Continue to hold anticoagulation due to concern for GI bleed  - Check labs including BMP, Mg, Phos, H/H, serial troponin   Update:  Prior to starting on amiodarone drip spontaneously converted to sinus tachycardia confirmed on repeat EKG.  No longer having chest pain.  Given spontaneous conversion no need for amiodarone for now.  Okay to continue on metoprolol for rate control.  K and mag to be repleted.  Initial high-sensitivity troponin 30 likely acute myocardial injury in the setting of arrhythmia.  Plan to trend troponin.  Hemoglobin 7 which is down trended in recent days.  Considering her cardiac history with CABG and PCI and ischemic changes associated with her arrhythmia favor transfusion to hemoglobin greater than 8.  Discussed with patient and her daughter and her daughter consented and ordered for 1 unit of RBC  Dolly Rias, MD  [facility]

## 2023-08-07 NOTE — Progress Notes (Signed)
 PROGRESS NOTE  [name] [name], is a 73 y.o. female, DOB - 12/24/1950, ZOX:[id number]  Admit date - 08/04/2023   Admitting Physician Lavanda Porter, MD  Outpatient Primary MD for the patient is Dorena Gander, MD  LOS - 3  Chief Complaint  Patient presents with   Failure To Thrive      Brief Narrative:  -73 y.o. 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 admitted on 08/04/2023 with acute metabolic encephalopathy and generalized weakness and deconditioning    -Assessment and Plan: 1)acute metabolic encephalopathy and generalized weakness and deconditioning-- -multifactorial in the setting of influenza infection, NASH liver cirrhosis,  ESRD with missed hemodialysis sessions and OSA with chronic hypercapnia with intolerance of BiPAP/NIV --- Use BiPAP/NIV as tolerated -Nephrology consult for hemodialysis -Supportive care for influenza--patient did not tolerate Tamiflu 08/06/23 -- Mentation appears to be back to baseline according to patient's husband who is at bedside. -Expressing to be weak and tired.  2)ESRD----M/Tu/Th/F schedule----as outpatient -last HD prior to this admission 07/31/2023 -Patient usually gets hemodialysis at least 4 times a week as outpt -Nephrology consult requested for HD--nephrologist recommends MWF schedule while inpatient -Had hemodialysis via 08/05/2023 -Nephrology team plans to repeat HD later today 08/07/2023  3)Liver cirrhosis secondary to NASH (HCC) -Serum ammonia is not elevated -INR is 4.0 watch closely -INR down to 2.7 currently; continue GI service recommendation.  4)Acute on chronic chronic anemia of ESRD/?? ABLA superimposed on chronic anemia of ESRD =-Hgb  11.4 >> 9.4  -Serum iron  and ferritin are not low On 08/06/23 she had Had dark stool with some  burgundy outline---Hemoccult blood is positive ----Holding Eliquis  for now---Last Dose of Eliquis  08/04/23 at 0900 am -on 07/31/23 she was seen by GI at [facility] (Dr. Leonia Raman) and at the moment no recommendations for inpatient endoscopic evaluation provided. -Gi input/consult at [facility] requested -Defer decision on ESA/Procrit  to nephrology team -Continue to hold blood thinners -Planning for transfusion on 08/09/2023 (starting hemodialysis).  5)PAFib/s/p PPM--Eliquis  on hold due to bleeding concerns in the setting of liver cirrhosis with elevated INR--please see #4 above -Last Dose of Eliquis  08/04/23 at 0900 am -- Continue metoprolol  for rate control  6)Chronic diastolic dysfunction CHF/HFpEF--- continue Toprol -XL -Use hemodialysis sessions to address volume status -Next HD 08/07/2023  7)CAD history of CABG, multiple PCI / HLD:  -Continue pravastatin  and aspirin   --Eliquis  on hold as above #5 (Last Dose of Eliquis  08/04/23 at 0900 am) ---- Patient gets outpatient Repatha  and Vascepa   8)DM2-A1c 6.7 reflecting good diabetic control PTA --Hold PTA insulin  pump-- -c/n  Semglee  3 units nightly -Sliding scale with Apidra  as ordered -Diabetic educator input appreciated -Continue to follow CBG fluctuation.  9)Generalized Weakness and Deconditioning/FTT--- physical therapy eval appreciated recommends SNF rehab -- Husband is not sure that they want to go to SNF rehab  10) moderate protein calorie malnutrition -Associated with chronic medical problems -Nepro recommended.  History RCC, right nephrectomy: Outpatient surveillance Remote history lymphoma: outpatient surveillance H/o Breast Ca/DCIS--previously treated  Status is: Inpatient   Disposition: The patient is from: Home              Anticipated d/c is to: Home              Anticipated d/c date is: 2 days  Patient currently is not medically stable to d/c. Barriers: Not Clinically Stable-   Code Status :  -  Code Status: Full Code    Family Communication:   (patient is alert, awake and coherent)  Discussed with Husband updated at bedside 08/07/2023  DVT Prophylaxis  :   - SCDs /subcu heparin  for DVT prophylaxis while Eliquis  is on hold ---Last Dose of Eliquis  08/04/23 at 0900 am  Lab Results  Component Value Date   PLT 215 08/07/2023   Inpatient Medications  Scheduled Meds:  aspirin  EC  81 mg Oral Q breakfast   Chlorhexidine  Gluconate Cloth  6 each Topical Daily   Chlorhexidine  Gluconate Cloth  6 each Topical Q0600   gabapentin   100 mg Oral QHS   insulin  glargine-yfgn  3 Units Subcutaneous Daily   insulin  glulisine  0-5 Units Subcutaneous QHS   insulin  glulisine  0-9 Units Subcutaneous TID WC   insulin  glulisine  2 Units Subcutaneous TID WC   lactulose   10 g Oral BID   metoprolol  tartrate  12.5 mg Oral BID   pantoprazole  (PROTONIX ) IV  40 mg Intravenous Q12H   pravastatin   20 mg Oral q1800   sodium chloride  flush  3 mL Intravenous Q12H   Continuous Infusions:  albumin  human     doxycycline  (VIBRAMYCIN ) IV 100 mg (08/07/23 1009)   PRN Meds:.albumin  human, alteplase , guaiFENesin -dextromethorphan , heparin , ondansetron  (ZOFRAN ) IV, mouth rinse, phenol   Anti-infectives (From admission, onward)    Start     Dose/Rate Route Frequency Ordered Stop   08/05/23 0000  doxycycline  (VIBRAMYCIN ) 100 mg in dextrose  5 % 250 mL IVPB        100 mg 125 mL/hr over 120 Minutes Intravenous Every 12 hours 08/04/23 2312     08/04/23 2315  doxycycline  (VIBRAMYCIN ) 200 mg in dextrose  5 % 250 mL IVPB  Status:  Discontinued        200 mg 125 mL/hr over 120 Minutes Intravenous  Once 08/04/23 2312 08/04/23 2314      Subjective: [name] [name] chronically ill in appearance; no fever, no chest pain, no nausea, no vomiting.  No overt bleeding reported overnight.  Expressing to be generally weak.  Objective: Vitals:   08/07/23 1730 08/07/23 1745 08/07/23 1800 08/07/23 1815  BP: (!) 89/40 (!) 104/43 (!) 98/43 (!) 99/50   Pulse:      Resp: (!) 21 20 19 20   Temp:      TempSrc:      SpO2:      Weight:      Height:        Intake/Output Summary (Last 24 hours) at 08/07/2023 1825 Last data filed at 08/07/2023 1315 Gross per 24 hour  Intake 917.57 ml  Output 50 ml  Net 867.57 ml   Filed Weights   08/06/23 0358 08/07/23 0530 08/07/23 1650  Weight: 47.7 kg 47.6 kg 47.9 kg    Physical Exam General exam: Alert, awake, oriented x 3; chronically ill in appearance and expressing generalized weakness. Respiratory system: Good saturation on room air. Cardiovascular system: Rate controlled and currently sinus; no rubs, no gallops, no JVD on exam.  Pacemaker in situ. Gastrointestinal system: Abdomen is nondistended, soft and nontender.  Positive bowel sounds appreciated. Central nervous system: Alert and oriented. No focal neurological deficits. Extremities: No cyanosis or clubbing.  Trace edema appreciated bilaterally. Skin: No petechiae; multiple bruises appreciated.  Right IJ hemodialysis catheter in place. Psychiatry: Judgement and insight appear normal.  Flat affect.  Data Reviewed: I have  personally reviewed following labs and imaging studies  CBC: Recent Labs  Lab 08/01/23 0829 08/04/23 1600 08/05/23 0323 08/06/23 1828 08/07/23 0440  WBC 10.9* 10.6* 12.3* 12.2* 11.3*  NEUTROABS  --  8.4*  --   --   --   HGB 10.6* 9.5* 9.4* 7.1* 7.0*  HCT 30.9* 28.0* 25.8* 21.4* 21.8*  MCV 90.1 91.2 92.5 92.6 95.2  PLT 148* 186 209 201 215   Basic Metabolic Panel: Recent Labs  Lab 08/01/23 0459 08/04/23 1600 08/05/23 0323 08/06/23 0345 08/07/23 0440  NA 129* 128* 130* 128* 129*  K 3.8 3.8 4.0 3.8 3.7  CL 93* 93* 95* 91* 92*  CO2 24 24 21* 19* 24  GLUCOSE 185* 202* 193* 350* 190*  BUN 37* 85* 91* 33* 57*  CREATININE 3.93* 2.93* 2.79* 1.38* 2.30*  CALCIUM  9.6 9.6 9.4 9.1 9.8  PHOS 3.9  --   --  2.6 3.5   GFR: Estimated Creatinine Clearance: 17.2 mL/min (A) (by C-G formula based on SCr of 2.3 mg/dL  (H)).  Liver Function Tests: Recent Labs  Lab 08/01/23 0459 08/04/23 1600 08/05/23 0323 08/06/23 0345 08/07/23 0440  AST  --  24 21  --  23  ALT  --  16 14  --  11  ALKPHOS  --  241* 197*  --  155*  BILITOT  --  2.0* 2.2*  --  2.4*  PROT  --  7.0 6.5  --  7.0  ALBUMIN  3.5 3.0* 2.8* 3.6 3.4*  3.3*   Cardiac Enzymes: Recent Labs  Lab 08/04/23 2148  CKTOTAL 18*   Recent Results (from the past 240 hours)  Blood Culture (routine x 2)     Status: None (Preliminary result)   Collection Time: 08/04/23  4:00 PM   Specimen: Left Antecubital; Blood  Result Value Ref Range Status   Specimen Description   Final    LEFT ANTECUBITAL BOTTLES DRAWN AEROBIC AND ANAEROBIC   Special Requests Blood Culture adequate volume  Final   Culture   Final    NO GROWTH 3 DAYS Performed at [facility], [address], [place], [place] [address]    Report Status PENDING  Incomplete  Blood Culture (routine x 2)     Status: None (Preliminary result)   Collection Time: 08/04/23  4:00 PM   Specimen: BLOOD LEFT ARM  Result Value Ref Range Status   Specimen Description BLOOD LEFT ARM BOTTLES DRAWN AEROBIC AND ANAEROBIC  Final   Special Requests Blood Culture adequate volume  Final   Culture   Final    NO GROWTH 3 DAYS Performed at [facility], [address], [place], [place] [address]    Report Status PENDING  Incomplete  MRSA Next Gen by PCR, Nasal     Status: None   Collection Time: 08/05/23 12:19 AM   Specimen: Nasal Mucosa; Nasal Swab  Result Value Ref Range Status   MRSA by PCR Next Gen NOT DETECTED NOT DETECTED Final    Comment: (NOTE) The GeneXpert MRSA Assay (FDA approved for NASAL specimens only), is one component of a comprehensive MRSA colonization surveillance program. It is not intended to diagnose MRSA infection nor to guide or monitor treatment for MRSA infections. Test performance is not FDA approved in patients less than 74 years old. Performed at [facility], [address], [place], [place] [address]     Radiology Studies: No results found.  Scheduled Meds:  aspirin  EC  81 mg Oral Q breakfast  Chlorhexidine  Gluconate Cloth  6 each Topical Daily   Chlorhexidine  Gluconate Cloth  6 each Topical Q0600   gabapentin   100 mg Oral QHS   insulin  glargine-yfgn  3 Units Subcutaneous Daily   insulin  glulisine  0-5 Units Subcutaneous QHS   insulin  glulisine  0-9 Units Subcutaneous TID WC   insulin  glulisine  2 Units Subcutaneous TID WC   lactulose   10 g Oral BID   metoprolol  tartrate  12.5 mg Oral BID   pantoprazole  (PROTONIX ) IV  40 mg Intravenous Q12H   pravastatin   20 mg Oral q1800   sodium chloride  flush  3 mL Intravenous Q12H   Continuous Infusions:  albumin  human     doxycycline  (VIBRAMYCIN ) IV 100 mg (08/07/23 1009)    LOS: 3 days   Justina Oman M.D on 08/07/2023 at 6:25 PM  Go to [email or web address] - for contact info  [facility] - Office  [phone]  If 7PM-7AM, please contact night-coverage [email or web address] 08/07/2023, 6:25 PM

## 2023-08-07 NOTE — Progress Notes (Signed)
Pt transported to HD.

## 2023-08-07 NOTE — Progress Notes (Signed)
 SLP Cancellation Note  Patient Details Name: AAHANA MOSES MRN: [id number] DOB: 18-May-1951   Cancelled treatment:       Reason Eval/Treat Not Completed: Patient at procedure or test/unavailable. ST will continue efforts, thank you,  Percy Comp H. Vergil Glasser, CCC-SLP Speech Language Pathologist    Florina Husbands 08/07/2023, 4:59 PM

## 2023-08-07 NOTE — Progress Notes (Signed)
 Pt HR now 108. Due to start amiodarone  drip. MD notified. Repeat EKG obtained. Shows Sinus tach. MD states to hold off on amiodarone  gtt.   Labs resulted. Pts hgb remains 7.0. MD placed order to transfuse 1 unit of blood. MD bedside to discuss with patient and daughter. Pt BP remains soft. MD states ok to give scheduled metoprolol . Patient consents to receiving blood.

## 2023-08-08 ENCOUNTER — Inpatient Hospital Stay (HOSPITAL_COMMUNITY): Payer: Medicare PPO

## 2023-08-08 DIAGNOSIS — K921 Melena: Secondary | ICD-10-CM

## 2023-08-08 DIAGNOSIS — Z8719 Personal history of other diseases of the digestive system: Secondary | ICD-10-CM

## 2023-08-08 DIAGNOSIS — D649 Anemia, unspecified: Secondary | ICD-10-CM | POA: Diagnosis not present

## 2023-08-08 DIAGNOSIS — R5383 Other fatigue: Secondary | ICD-10-CM | POA: Diagnosis not present

## 2023-08-08 DIAGNOSIS — R1310 Dysphagia, unspecified: Secondary | ICD-10-CM | POA: Diagnosis not present

## 2023-08-08 DIAGNOSIS — Z950 Presence of cardiac pacemaker: Secondary | ICD-10-CM | POA: Diagnosis not present

## 2023-08-08 DIAGNOSIS — R627 Adult failure to thrive: Secondary | ICD-10-CM | POA: Diagnosis not present

## 2023-08-08 DIAGNOSIS — N186 End stage renal disease: Secondary | ICD-10-CM | POA: Diagnosis not present

## 2023-08-08 DIAGNOSIS — R112 Nausea with vomiting, unspecified: Secondary | ICD-10-CM | POA: Diagnosis not present

## 2023-08-08 LAB — TYPE AND SCREEN
ABO/RH(D): O POS
Antibody Screen: NEGATIVE
Unit division: 0

## 2023-08-08 LAB — HEMOGLOBIN AND HEMATOCRIT, BLOOD
HCT: 25.9 % — ABNORMAL LOW (ref 36.0–46.0)
Hemoglobin: 8.6 g/dL — ABNORMAL LOW (ref 12.0–15.0)

## 2023-08-08 LAB — TROPONIN I (HIGH SENSITIVITY): Troponin I (High Sensitivity): 28 ng/L — ABNORMAL HIGH (ref <18)

## 2023-08-08 LAB — BPAM RBC
Blood Product Expiration Date: 202502192359
ISSUE DATE / TIME: 202501152254
Unit Type and Rh: 5100

## 2023-08-08 LAB — GLUCOSE, CAPILLARY
Glucose-Capillary: 180 mg/dL — ABNORMAL HIGH (ref 70–99)
Glucose-Capillary: 291 mg/dL — ABNORMAL HIGH (ref 70–99)
Glucose-Capillary: 216 mg/dL — ABNORMAL HIGH (ref 70–99)
Glucose-Capillary: 219 mg/dL — ABNORMAL HIGH (ref 70–99)

## 2023-08-08 MED ORDER — MAGNESIUM SULFATE 2 GM/50ML IV SOLN
2.0000 g | Freq: Once | INTRAVENOUS | Status: AC
  Administered 2023-08-08: 2 g via INTRAVENOUS
  Filled 2023-08-08: qty 50

## 2023-08-08 MED ORDER — ONDANSETRON HCL 4 MG/2ML IJ SOLN: 4.0000 mg | Freq: Four times a day (QID) | INTRAMUSCULAR | Status: DC | PRN

## 2023-08-08 MED ORDER — POTASSIUM CHLORIDE CRYS ER 20 MEQ PO TBCR
40.0000 meq | EXTENDED_RELEASE_TABLET | Freq: Once | ORAL | Status: AC
  Administered 2023-08-08: 40 meq via ORAL
  Filled 2023-08-08: qty 2

## 2023-08-08 MED ORDER — DOXYCYCLINE HYCLATE 100 MG PO TABS
100.0000 mg | ORAL_TABLET | Freq: Two times a day (BID) | ORAL | Status: DC
Start: 2023-08-08 — End: 2023-08-09
  Administered 2023-08-08 – 2023-08-09 (×2): 100 mg via ORAL
  Filled 2023-08-08 (×2): qty 1

## 2023-08-08 MED ORDER — POTASSIUM CHLORIDE 10 MEQ/100ML IV SOLN
10.0000 meq | INTRAVENOUS | Status: AC
  Administered 2023-08-08 (×2): 10 meq via INTRAVENOUS
  Filled 2023-08-08 (×2): qty 100

## 2023-08-08 MED ORDER — ONDANSETRON 4 MG PO TBDP
4.0000 mg | ORAL_TABLET | Freq: Four times a day (QID) | ORAL | Status: DC | PRN
  Administered 2023-08-08: 4 mg via ORAL
  Filled 2023-08-08: qty 1

## 2023-08-08 NOTE — Progress Notes (Signed)
Physical Therapy Treatment Patient Details Name: [name] [name] MRN: [id number] DOB: 1951-01-22 Today's Date: 08/08/2023   History of Present Illness [name] [name] is a 73 y.o. 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 Pt has H/O is FTT. Pt was just d/c Wednesday. And since then she has been very weak. Pt lives with spouse, but he is not able to lift her and move her and take her to dialysis.  Since coming home patient has been lethargic and weak and decreased p.o. intake insulin has been held due to that.  Spouse states she has had left ear pain and has had bruising from the face mask last time she had when admitted.    PT Comments  Patient presents up in chair (assisted by nursing staff) and agreeable for therapy.  Patient demonstrates fair/good return for completing BLE ROM/strengthening exercises while seated at bedside, increased endurance/distance for taking steps at bedside with slow labored movement and limited mostly due to c/o fatigue and difficulty gripping RW with left hand due to pain. Patient will benefit from continued skilled physical therapy in hospital and recommended venue below to increase strength, balance, endurance for safe ADLs and gait.      If plan is discharge home, recommend the following: A lot of help with walking and/or transfers;A lot of help with bathing/dressing/bathroom;Help with stairs or ramp for entrance;Assistance with cooking/housework   Can travel by private vehicle     Yes  Equipment Recommendations  Rolling walker (2 wheels)    Recommendations for  Other Services       Precautions / Restrictions Precautions Precautions: Fall Restrictions Weight Bearing Restrictions Per Provider Order: No     Mobility  Bed Mobility               General bed mobility comments: Patient presents seated in chair (assisted by nursing staff)    Transfers Overall transfer level: Needs assistance Equipment used: Rolling walker (2 wheels) Transfers: Sit to/from Stand, Bed to chair/wheelchair/BSC Sit to Stand: Min assist, Mod assist   Step pivot transfers: Min assist, Mod assist       General transfer comment: unsteady labored movement    Ambulation/Gait Ambulation/Gait assistance: Mod assist Gait Distance (Feet): 10 Feet Assistive device: Rolling walker (2 wheels) Gait Pattern/deviations: Decreased step length - right, Decreased step length - left, Decreased stride length, Wide base of support Gait velocity: decreased     General Gait Details: limited to a few slow labored steps forward/backwards at bedside before having to sit due to c/o fatigue   Stairs             Wheelchair Mobility     Tilt Bed    Modified Rankin (Stroke Patients Only)       Balance Overall balance assessment: Needs assistance Sitting-balance support: Feet supported, No upper extremity supported Sitting balance-Leahy Scale: Fair Sitting balance - Comments: fair/good seated in chair   Standing balance support: Reliant on assistive device for balance, During functional activity, Bilateral upper extremity supported Standing balance-Leahy Scale: Poor Standing balance comment: fair/poor  using RW                            Cognition Arousal: Alert Behavior During Therapy: WFL for tasks assessed/performed Overall Cognitive Status: Within Functional Limits for tasks assessed Area of Impairment: Safety/judgement, Problem solving                                        Exercises General Exercises - Lower  Extremity Long Arc Quad: Seated, AROM, Strengthening, Both, 10 reps Hip Flexion/Marching: Seated, AROM, Strengthening, Both, 10 reps Toe Raises: Seated, AROM, Strengthening, Both, 10 reps Heel Raises: Seated, AROM, Strengthening, Both, 10 reps    General Comments        Pertinent Vitals/Pain Pain Assessment Pain Assessment: Faces Faces Pain Scale: Hurts little more Pain Location: left hand/wrist due to fragile skin Pain Descriptors / Indicators: Grimacing, Guarding, Sore Pain Intervention(s): Limited activity within patient's tolerance, Monitored during session, Repositioned    Home Living                          Prior Function            PT Goals (current goals can now be found in the care plan section) Acute Rehab PT Goals Patient Stated Goal: return home PT Goal Formulation: With patient/family Time For Goal Achievement: 08/19/23 Potential to Achieve Goals: Good Progress towards PT goals: Progressing toward goals    Frequency    Min 3X/week      PT Plan      Co-evaluation              AM-PAC PT "6 Clicks" Mobility   Outcome Measure  Help needed turning from your back to your side while in a flat bed without using bedrails?: A Little Help needed moving from lying on your back to sitting on the side of a flat bed without using bedrails?: A Lot Help needed moving to and from a bed to a chair (including a wheelchair)?: A Lot Help needed standing up from a chair using your arms (e.g., wheelchair or bedside chair)?: A Lot Help needed to walk in hospital room?: A Lot Help needed climbing 3-5 steps with a railing? : A Lot 6 Click Score: 13    End of Session   Activity Tolerance: Patient tolerated treatment well;Patient limited by fatigue Patient left: in chair;with call bell/phone within reach Nurse Communication: Mobility status PT Visit Diagnosis: Unsteadiness on feet (R26.81);Muscle weakness (generalized) (M62.81);Other abnormalities of gait  and mobility (R26.89);History of falling (Z91.81)     Time: 1610-9604 PT Time Calculation (min) (ACUTE ONLY): 22 min  Charges:    $Therapeutic Exercise: 8-22 mins $Therapeutic Activity: 8-22 mins PT General Charges $$ ACUTE PT VISIT: 1 Visit                     3:58 PM, 08/08/23 Ocie Bob, MPT Physical Therapist with [facility] [phone] office [phone] mobile phone

## 2023-08-08 NOTE — Evaluation (Signed)
Clinical/Bedside Swallow Evaluation Patient Details  Name: [name] [name] MRN: [id number] Date of Birth: [birth year]  Today's Date: 08/08/2023 Time: SLP Start Time (ACUTE ONLY): 1402 SLP Stop Time (ACUTE ONLY): 1433 SLP Time Calculation (min) (ACUTE ONLY): 31 min  Past Medical History:  Past Medical History:  Diagnosis Date   Anxiety    Aortic stenosis    a. bicuspid aortic valve; mean gradient of 20 mmHg in 2/10; b. s/p bioprosth AVR (19-mm Edwards pericardial valve) with a redo coronary artery bypass graft procedure in 07/2009; postoperative Dressler's syndrome     Arthritis    Breast cancer (HCC)    Carcinoma, renal cell (HCC) 05/2007   Laparoscopic right nephrectomy   Chronic diastolic CHF (congestive heart failure) (HCC)    a. 9.2014 EchoP EF 60-65%, no rwma, bioprosth AVR, mean gradient of , mild to mod MR, PASP .   Colitis, ischemic (HCC) 2008   Complication of anesthesia    Coronary artery disease    a. initial CABG with SVG-RCA in 12/05. b. redo SVG-RCA and bioprosthetic AVR in 1/11. d. 9/14 - PCI with DES to distal LM/proximal LAD was done rather than bypass as she would have been a poor candidate for redo sternotomy. d. S/p DES to prox LM 05/2014.   Diabetes mellitus 03/2010    TYPE II: Hemoglobin A1c of 7.4 in  03/2010; 8.4 in 06/2010; treated with insulin pump   Duodenal ulcer    Remote; H. pylori positive   Dyspnea    Gastroparesis due to DM (HCC) 05/01/2012   Glaucoma 1998   Hodgkin's disease (HCC) 1991   Mantle radiation therapy   Hyperlipidemia    Hyperplastic gastric polyp 06/23/2012   Hypertension    Iron deficiency anemia    a. 03/2013 EGD: essentially normal. Possible slow GIB.   Migraines    NASH (nonalcoholic steatohepatitis) 1999   -biopsy in 1999   OSA treated with BiPAP    Osteoporosis    Pacemaker    six sinus syndrome   PAF (paroxysmal atrial fibrillation) (HCC)    a.  h/o brief episodes noted on ppm interrogation. b. given GI  bleeding and need to be on both ASA 81 and Brilinta, risks likely outweigh benefits of anticoagulation.    PONV (postoperative nausea and vomiting)    Small bowel obstruction (HCC)    Recurrent; resolved after resection of a lipoma   Syncope    a. H/o recurrent syncope with pauses on loop recorder s/p Medtronic pacemaker 07/2012.   Past Surgical History:  Past Surgical History:  Procedure Laterality Date   ABDOMINAL HYSTERECTOMY  2000   AORTIC VALVE REPLACEMENT  2011   Aortic valve replacement surgery with a 19 mm bioprosthetic device post redo CABG January 2011.   AV FISTULA PLACEMENT Right 05/01/2023   Procedure: RIGHT UPPER ARM BRACHIOBASILIC ARTERIOVENOUS FISTULA CREATION;  Surgeon: Nada Libman, MD;  Location: [facility] OR;  Service: Vascular;  Laterality: Right;   BONE MARROW BIOPSY     BREAST EXCISIONAL BIOPSY     benign, bilateral   CARDIAC CATHETERIZATION     CERVICAL BIOPSY     COLONOSCOPY     Multiple with adenomatous polyps   COLONOSCOPY  06/23/2012   Procedure: COLONOSCOPY;  Surgeon: Iva Boop, MD;  Location: [facility] ENDOSCOPY;  Service: Endoscopy;  Laterality: N/A;   CORONARY ARTERY BYPASS GRAFT  2005, 2011   CABG-most recent SVG to RCA-12/05; RCA occluded with patent graft in 2006; Redo bypass in 07/2009  ESOPHAGOGASTRODUODENOSCOPY     ESOPHAGOGASTRODUODENOSCOPY  06/23/2012   Procedure: ESOPHAGOGASTRODUODENOSCOPY (EGD);  Surgeon: Iva Boop, MD;  Location: [name] ENDOSCOPY;  Service: Endoscopy;  Laterality: N/A;   ESOPHAGOGASTRODUODENOSCOPY N/A 04/06/2013   Procedure: ESOPHAGOGASTRODUODENOSCOPY (EGD);  Surgeon: Hilarie Fredrickson, MD;  Location: [facility] ENDOSCOPY;  Service: Endoscopy;  Laterality: N/A;   EXPLORATORY LAPAROTOMY W/ BOWEL RESECTION     Small bowel resection of lipoma   GIVENS CAPSULE STUDY N/A 04/30/2013   Procedure: GIVENS CAPSULE STUDY;  Surgeon: Iva Boop, MD;  Location: [facility] ENDOSCOPY;  Service: Endoscopy;  Laterality: N/A;   INSERTION OF DIALYSIS CATHETER Right  05/01/2023   Procedure: INSERTION OF TUNNELED DIALYSIS CATHETER USING PALINDROME CATHETER 23CM;  Surgeon: Nada Libman, MD;  Location: [facility] OR;  Service: Vascular;  Laterality: Right;   LEFT AND RIGHT HEART CATHETERIZATION WITH CORONARY ANGIOGRAM N/A 04/07/2013   Procedure: LEFT AND RIGHT HEART CATHETERIZATION WITH CORONARY ANGIOGRAM;  Surgeon: Laurey Morale, MD;  Location: [facility] CATH LAB;  Service: Cardiovascular;  Laterality: N/A;   LEFT ATRIAL APPENDAGE OCCLUSION N/A 02/16/2016   Watchman not placed - nickel allergy   LEFT HEART CATHETERIZATION WITH CORONARY/GRAFT ANGIOGRAM N/A 06/07/2014   Procedure: LEFT HEART CATHETERIZATION WITH Isabel Caprice;  Surgeon: Kathleene Hazel, MD;  Location: [facility] CATH LAB;  Service: Cardiovascular;  Laterality: N/A;   LIVER BIOPSY     LOOP RECORDER IMPLANT N/A 12/10/2011   Procedure: LOOP RECORDER IMPLANT;  Surgeon: Marinus Maw, MD;  Location: [facility] CATH LAB;  Service: Cardiovascular;  Laterality: N/A;   MALONEY DILATION  06/23/2012   Procedure: Elease Hashimoto DILATION;  Surgeon: Iva Boop, MD;  Location: [facility] ENDOSCOPY;  Service: Endoscopy;;  54 fr   MASTECTOMY Left 05/17/2015   total - no lymph nodes removed per patient   NEPHRECTOMY  2008   Laparoscopic right nephrectomy, renal cell carcinoma   PACEMAKER INSERTION  08/21/2012   Medtronic Adapta L dual-chamber pacemaker   PERCUTANEOUS CORONARY STENT INTERVENTION (PCI-S) N/A 04/09/2013   Procedure: PERCUTANEOUS CORONARY STENT INTERVENTION (PCI-S);  Surgeon: Kathleene Hazel, MD;  Location: [facility] CATH LAB;  Service: Cardiovascular;  Laterality: N/A;   PERMANENT PACEMAKER INSERTION N/A 08/21/2012   Procedure: PERMANENT PACEMAKER INSERTION;  Surgeon: Marinus Maw, MD;  Location: [facility] CATH LAB;  Service: Cardiovascular;  Laterality: N/A;   RIGHT HEART CATH N/A 03/18/2019   Procedure: RIGHT HEART CATH;  Surgeon: Laurey Morale, MD;  Location: [facility] INVASIVE CV LAB;  Service: Cardiovascular;   Laterality: N/A;   RIGHT HEART CATH N/A 08/21/2022   Procedure: RIGHT HEART CATH;  Surgeon: Laurey Morale, MD;  Location: [facility] INVASIVE CV LAB;  Service: Cardiovascular;  Laterality: N/A;   RIGHT HEART CATH AND CORONARY/GRAFT ANGIOGRAPHY N/A 04/19/2017   Procedure: RIGHT HEART CATH AND CORONARY/GRAFT ANGIOGRAPHY;  Surgeon: Laurey Morale, MD;  Location: [facility] INVASIVE CV LAB;  Service: Cardiovascular;  Laterality: N/A;   TEE WITHOUT CARDIOVERSION N/A 12/07/2015   Procedure: TRANSESOPHAGEAL ECHOCARDIOGRAM (TEE);  Surgeon: Laurey Morale, MD;  Location: [facility] ENDOSCOPY;  Service: Cardiovascular;  Laterality: N/A;   TOTAL ABDOMINAL HYSTERECTOMY W/ BILATERAL SALPINGOOPHORECTOMY  2000   endometriosis and ovarian cysts   TOTAL MASTECTOMY Left 05/17/2015   Procedure: LEFT TOTAL MASTECTOMY;  Surgeon: Emelia Loron, MD;  Location: [facility] OR;  Service: General;  Laterality: Left;   TUMOR EXCISION  1981   removal of Hodgkins lymphoma   HPI:  Bonita C. Henness is a 73 year old female with history  of  ESRD on HD  M/T/TH/F, insulin pump-dependent diabetes type 1, CAD s/p CABG x 2, s/p bioprosthetic AVR, A-fib on Eliquis, s/p PPM in place, RCC-s/p right nephrectomy, remote history of lymphoma, breast cancer, MASH Liver Cirrhosis following with [facility], prior GI bleeding in the setting of duodenal ulcer (H. pylori positive), recent hospitalization earlier this month with influenza A, pleural effusion, weakness, AMS, now admitted again on 08/04/2023 with acute metabolic encephalopathy and generalized weakness and deconditioning. Also with acute on chronic anemia with episode of rectal bleeding, GI  consulted for further evaluation. BSE requested   EGD: 07-2022 - hiatal hernia, fundic gland polyps, negative for EVs; 03/10/2020, enteroscopy, pt states she was told no esophageal varices   Assessment / Plan / Recommendation  Clinical Impression  Clinical swallow evaluation completed in room with family present, while Pt  seated up in recliner. OME is WNL with the exception of bruising near Pt's left eye from previous bi-pap and bruising on her neck. Pt with occasional dry cough throughout the visit and husband reports that they were both sick with a cough and he has just gotten over his and hers seems to be lingering. Pt has extensive medical history with h/o gastroparesis and esophageal dysphagia. 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 Pt with kyphosis and suspect this impacts esophageal motility when eating in bed (husband reports increased "slumping" in bed and regurgitation after coughing episodes). Pt did not exhibit signs of aspiration during SLP assessment clinically today when assessed with ice chips, water via cup/straw, puree and regular textures. Suspect some element of esophageal dysmotility.   Recommend regular textures and thin liquids with standard aspiration and reflux precautions. Ideally, Pt to sit up in chair for meals or at the edge of bed if tolerated to facilitate improved positioning for esophageal clearance. PO medication whole with water. Pt and family given written reflux precautions with a focus on frequent, smaller meals throughout the day and optimal positioning. If further assessment is indicated, consider barium pill esophagram given that Pt's symptoms appear most consistent with esophageal dysmotility and reports of occasional difficulty with pills. No further SLP services indicated at this time.    SLP Visit Diagnosis: Dysphagia, unspecified (R13.10)    Aspiration Risk  Mild aspiration risk    Diet Recommendation Regular;Thin liquid    Liquid Administration via: Cup;Straw Medication Administration: Whole meds with liquid Supervision: Patient able to self feed Compensations: Slow rate Postural Changes: Seated upright at 90 degrees;Remain upright for at least 30 minutes after po intake    Other  Recommendations Recommended  Consults: Consider esophageal assessment (consider BPE) Oral Care Recommendations: Oral care BID    Recommendations for follow up therapy are one component of a multi-disciplinary discharge planning process, led by the attending physician.  Recommendations may be updated based on patient status, additional functional criteria and insurance authorization.  Follow up Recommendations No SLP follow up      Assistance Recommended at Discharge    Functional Status Assessment Patient has not had a recent decline in their functional status  Frequency and Duration            Prognosis Prognosis for improved oropharyngeal function: Good      Swallow Study   General Date of Onset: 08/04/23 HPI: Octavio Graves C. Burell is a 73 year old female with history  of  ESRD on HD M/T/TH/F, insulin pump-dependent diabetes type 1, CAD s/p CABG x 2, s/p bioprosthetic AVR, A-fib on Eliquis, s/p PPM  in place, RCC-s/p right nephrectomy, remote history of lymphoma, breast cancer, MASH Liver Cirrhosis following with [facility], prior GI bleeding in the setting of duodenal ulcer (H. pylori positive), recent hospitalization earlier this month with influenza A, pleural effusion, weakness, AMS, now admitted again on 08/04/2023 with acute metabolic encephalopathy and generalized weakness and deconditioning. Also with acute on chronic anemia with episode of rectal bleeding, GI  consulted for further evaluation. BSE requested Type of Study: Bedside Swallow Evaluation Previous Swallow Assessment: none in chart Diet Prior to this Study: Regular;Thin liquids (Level 0) Temperature Spikes Noted: No Respiratory Status: Room air History of Recent Intubation: No Behavior/Cognition: Alert;Cooperative;Pleasant mood Oral Cavity Assessment: Within Functional Limits Oral Care Completed by SLP: Recent completion by staff Oral Cavity - Dentition: Adequate natural dentition;Missing dentition Vision: Functional for self-feeding Self-Feeding  Abilities: Able to feed self Patient Positioning: Upright in chair Baseline Vocal Quality: Normal Volitional Cough: Strong Volitional Swallow: Able to elicit    Oral/Motor/Sensory Function Overall Oral Motor/Sensory Function: Within functional limits   Ice Chips Ice chips: Within functional limits Presentation: Spoon   Thin Liquid Thin Liquid: Within functional limits Presentation: Cup;Self Fed;Straw    Nectar Thick Nectar Thick Liquid: Not tested   Honey Thick Honey Thick Liquid: Not tested   Puree Puree: Within functional limits Presentation: Spoon   Solid     Solid: Within functional limits Presentation: Self Fed     Thank you,  Havery Moros, CCC-SLP [phone]  Rubyann Lingle 08/08/2023,2:59 PM

## 2023-08-08 NOTE — Progress Notes (Signed)
Pt states she is unable to tolerate potassium gtt. MD notified. Stated its ok to stop it.

## 2023-08-08 NOTE — Progress Notes (Signed)
Transferred from ICU.  Alert and oriented,  Vitals stable.

## 2023-08-08 NOTE — Progress Notes (Signed)
Gastroenterology Progress Note   Referring Provider: No ref. provider found Primary Care Physician:  Aliene Beams, MD Primary Gastroenterologist:  [facility]   Patient ID: AGELIKI WESTLAKE; [id number]; [birth year]   Subjective:    Alert, daughter, Arline Asp at bedside. Tolerated breakfast. No abdominal pain. Daughter reports, three stools overnight. The first one was soft with firm pieces, dark mixed with dark red blood. Second stool was large/loose almost black. Third one again almost black but smaller amount. Nursing staff say no stool morning shift but reported melena overnight. She continues on lactulose. States it was started during her hospitalization at [facility]. No n/v.   Arline Asp reports that patient's husband, Gery Pray, is resistant to endoscopic procedures due to patient having cognitive issues after sedation. Recommends discussing any plans of procedures with him.   Objective:   Vital signs in last 24 hours: Temp:  [97.5 F (36.4 C)-98.2 F (36.8 C)] 98.1 F (36.7 C) (01/16 0736) Pulse Rate:  [94-140] 97 (01/16 0700) Resp:  [13-27] 15 (01/16 0700) BP: (89-126)/(27-72) 118/36 (01/16 0700) SpO2:  [92 %-100 %] 97 % (01/16 0700) Weight:  [47.9 kg] 47.9 kg (01/15 1650) Last BM Date : 08/07/23 General:   Alert,  frail appearing, pleasant and cooperative in NAD Head:  Normocephalic and atraumatic. Eyes:  Sclera clear, no icterus.   Abdomen:  Soft, nontender and nondistended.   Normal bowel sounds, without guarding, and without rebound.   Extremities:  Without clubbing, deformity or edema. Neurologic:  Alert and  oriented x4  Psych:  Alert and cooperative. Normal mood and affect.  Intake/Output from previous day: 01/15 0701 - 01/16 0700 In: 811 [I.V.:34.5; Blood:410; IV Piggyback:366.5] Out: 751 [Urine:50; Stool:1] Intake/Output this shift: No intake/output data recorded.  Lab Results: CBC Recent Labs    08/06/23 1828 08/07/23 0440 08/07/23 2121  WBC 12.2* 11.3*  --   HGB  7.1* 7.0* 7.0*  HCT 21.4* 21.8* 21.1*  MCV 92.6 95.2  --   PLT 201 215  --    BMET Recent Labs    08/06/23 0345 08/07/23 0440 08/07/23 2121  NA 128* 129* 129*  K 3.8 3.7 3.3*  CL 91* 92* 94*  CO2 19* 24 26  GLUCOSE 350* 190* 183*  BUN 33* 57* 28*  CREATININE 1.38* 2.30* 1.50*  CALCIUM 9.1 9.8 9.2   LFTs Recent Labs    08/06/23 0345 08/07/23 0440  BILITOT  --  2.4*  BILIDIR  --  1.0*  IBILI  --  1.4*  ALKPHOS  --  155*  AST  --  23  ALT  --  11  PROT  --  7.0  ALBUMIN 3.6 3.4*  3.3*   No results for input(s): "LIPASE" in the last 72 hours. PT/INR Recent Labs    08/07/23 0440  LABPROT 24.5*  INR 2.2*         Imaging Studies: CT Chest Wo Contrast Result Date: 08/04/2023 CLINICAL DATA:  Pneumonia, complication suspected, xray done Failure to thrive. EXAM: CT CHEST WITHOUT CONTRAST TECHNIQUE: Multidetector CT imaging of the chest was performed following the standard protocol without IV contrast. RADIATION DOSE REDUCTION: This exam was performed according to the departmental dose-optimization program which includes automated exposure control, adjustment of the mA and/or kV according to patient size and/or use of iterative reconstruction technique. COMPARISON:  Radiograph earlier today. CT 5 days ago 07/30/2023. Chest CT 08/10/2022 also reviewed FINDINGS: Cardiovascular: Dialysis catheter tip in the right atrium. The heart is normal in size. Dense calcification of  native coronary arteries. Prosthetic aortic valve. Dense mitral annulus calcifications. Densely calcified thoracic aorta without aneurysm. Left-sided pacemaker with lead tips in the right atrium and ventricle. No significant pericardial effusion. Mediastinum/Nodes: Technically limited assessment in the absence of IV contrast and paucity of mediastinal fat. There is scattered small mediastinal lymph nodes, none of which are enlarged by size criteria. Hilar assessment is limited on this unenhanced exam. The esophagus  is decompressed. Lungs/Pleura: Loculated right pleural effusion with fluid tracking into the minor fissure as well as anterior laterally, without significant interval change. There is also a small medial component at the right lung base associated compressive atelectasis throughout the right lung. Nodular density at the right lung apex measures 13 mm, without significant interval change from exam last year, likely scarring. Additional left apical pleuroparenchymal scarring is similar. There is overall improvement in the multifocal ground-glass opacities in the left lung consistent with resolving pneumonia. Mild residual patchy and nodular areas of ground-glass persist. Greatest residual is in the left lower lobe measuring 11 mm series 3, image 71. In area of ill-defined increased density within the basilar right lower lobe is unchanged over the last 5 days. Right lower lobe mucoid impaction within many segmental bronchi. Upper Abdomen: Gallstones. Posterior left renal cyst is unchanged from 2023. No further follow-up imaging is recommended. Musculoskeletal: Stable over the last 5 days. Minor T7 superior endplate compression deformity. Median sternotomy. IMPRESSION: 1. Moderate-sized loculated pleural effusion is unchanged over the last 5 days. There is adjacent compressive atelectasis throughout the right lung. 2. Improvement in patchy areas of ground-glass likely resolving pneumonia. Areas of ground-glass persist, recommend CT follow-up to resolution after course of treatment. CT in 3 months would be reasonable. 3. Persistent area of increased density at the right lung base. This may represent an area of chronic atelectasis/aspiration, however is mention previously, difficult to exclude neoplasm. Consider pulmonary follow-up and outpatient PET CT as clinically indicated. 4. Stable area of nodular thickening at the right lung apex, favored to be scarring. 5. Additional stable chronic findings as described. Aortic  Atherosclerosis (ICD10-I70.0). Electronically Signed   By: Narda Rutherford M.D.   On: 08/04/2023 18:40   DG Chest Port 1 View Result Date: 08/04/2023 CLINICAL DATA:  Questionable sepsis - evaluate for abnormality Failure to thrive. EXAM: PORTABLE CHEST 1 VIEW COMPARISON:  Radiograph 07/29/2023, CT 07/30/2023 FINDINGS: Right-sided dialysis catheter remains in place. Left-sided pacemaker in place. Prior median sternotomy with prosthetic aortic valve. Loculated right pleural effusion with questionable increase from prior exam. Associated opacity in the right hemithorax is without significant interval change. Areas of ground-glass consolidation in the left lung on prior CT are not well demonstrated by radiograph. No pneumothorax. No convincing pulmonary edema. IMPRESSION: 1. Stable radiographic appearance of the chest. 2. Loculated right pleural effusion and opacities in the right hemithorax are unchanged over the last 6 days. 3. Additional patchy airspace disease on prior CT have no definite radiographic correlate. Electronically Signed   By: Narda Rutherford M.D.   On: 08/04/2023 16:06   ECHOCARDIOGRAM LIMITED Result Date: 07/30/2023    ECHOCARDIOGRAM LIMITED REPORT   Patient Name:   VERBAL HOLSTEN Date of Exam: 07/30/2023 Medical Rec #:  [id number]         Height:       62.0 in Accession #:    [id number]        Weight:       122.4 lb Date of Birth:  05-15-1951  BSA:          1.551 m Patient Age:    72 years          BP:           96/47 mmHg Patient Gender: F                 HR:           70 bpm. Exam Location:  Inpatient Procedure: Limited Echo, Cardiac Doppler and Color Doppler Indications:    Endocarditis  History:        Patient has prior history of Echocardiogram examinations, most                 recent 07/12/2023. Risk Factors:Hypertension and Dyslipidemia.                 Aortic Valve: Edwards valve is present in the aortic position.  Sonographer:    Melton Krebs RDCS, FE, PE Referring Phys:  [id number] Vinnie Level SMITH IMPRESSIONS  1. Left ventricular ejection fraction, by estimation, is 70 to 75%. The left ventricle has hyperdynamic function. There is the interventricular septum is flattened in systole and diastole, consistent with right ventricular pressure and volume overload.  2. Right ventricular systolic function is mildly reduced. The right ventricular size is mildly enlarged.  3. The mitral valve is degenerative. Mild to moderate mitral valve regurgitation. Severe mitral annular calcification.  4. Tricuspid valve regurgitation is moderate.  5. Bioprosthetic aortic valve. No doppler assessment performed. The aortic valve has been repaired/replaced. Aortic valve regurgitation is not visualized. There is a Edwards valve present in the aortic position. Conclusion(s)/Recommendation(s): No evidence of valvular vegetations on this transthoracic echocardiogram. Consider a transesophageal echocardiogram to exclude infective endocarditis if clinically indicated. FINDINGS  Left Ventricle: Left ventricular ejection fraction, by estimation, is 70 to 75%. The left ventricle has hyperdynamic function. The interventricular septum is flattened in systole and diastole, consistent with right ventricular pressure and volume overload. Right Ventricle: The right ventricular size is mildly enlarged. No increase in right ventricular wall thickness. Right ventricular systolic function is mildly reduced. Pericardium: There is no evidence of pericardial effusion. Mitral Valve: The mitral valve is degenerative in appearance. Severe mitral annular calcification. Mild to moderate mitral valve regurgitation. MV peak gradient, 8.3 mmHg. Tricuspid Valve: The tricuspid valve is grossly normal. Tricuspid valve regurgitation is moderate . No evidence of tricuspid stenosis. Aortic Valve: Bioprosthetic aortic valve. No doppler assessment performed. The aortic valve has been repaired/replaced. Aortic valve regurgitation is not visualized.  There is a Edwards valve present in the aortic position. Pulmonic Valve: The pulmonic valve was grossly normal. Pulmonic valve regurgitation is mild. No evidence of pulmonic stenosis. Venous: The inferior vena cava was not well visualized. Additional Comments: A device lead is visualized in the right atrium and right ventricle.  LEFT VENTRICLE PLAX 2D LVIDd:         3.30 cm LVIDs:         1.90 cm LV PW:         1.00 cm LV IVS:        0.90 cm  MITRAL VALVE MV Peak grad: 8.3 mmHg MV Vmax:      1.44 m/s MV Vmean:     80.8 cm/s Lennie Odor MD Electronically signed by Lennie Odor MD Signature Date/Time: 07/30/2023/5:25:40 PM    Final    CT CHEST WO CONTRAST Result Date: 07/30/2023 CLINICAL DATA:  Pneumonia, complications suspected. EXAM: CT CHEST WITHOUT CONTRAST TECHNIQUE:  Multidetector CT imaging of the chest was performed following the standard protocol without IV contrast. RADIATION DOSE REDUCTION: This exam was performed according to the departmental dose-optimization program which includes automated exposure control, adjustment of the mA and/or kV according to patient size and/or use of iterative reconstruction technique. COMPARISON:  Chest radiograph dated 07/29/2023 and CT dated 08/10/2022. FINDINGS: Evaluation of this exam is limited in the absence of intravenous contrast. Cardiovascular: There is no cardiomegaly. No significant pericardial effusion. Left pectoral pacing device. There is advanced 3 vessel coronary vascular calcification and calcification of the mitral annulus. There is postsurgical changes of CABG. Advanced atherosclerotic calcification of the thoracic aorta. No aneurysmal dilatation. The central pulmonary arteries are grossly unremarkable on this noncontrast CT. Mediastinum/Nodes: No obvious hilar or mediastinal adenopathy. Evaluation however is limited in the absence of intravenous contrast and due to consolidative changes of the right lung. The esophagus is grossly unremarkable. No  mediastinal fluid collection. Right IJ catheter with tip in the right atrium. Lungs/Pleura: There is a somewhat loculated appearing moderate size right pleural effusion tracking into the fissures. There is compressive atelectasis of portions of the right upper, right lower and right middle lobes. Areas of airspace density at the right lung base and in the right upper lobe as well as several scattered clusters of ground-glass nodularity in the left lung concerning for pneumonia. There is slight thickening of the right lung pleural surface. And ill-defined area of increased density in the right lower lobe measures approximately 2.3 x 2.1 cm (95/3) and may represent pneumonia or proteinaceous content within the pleural effusion. However, underlying mass is not excluded. Additionally there is a 1.7 x 0.9 cm nodular density in the right apex which may represent scarring or a pulmonary nodule. There is no pneumothorax. The central airways are patent. Upper Abdomen: No acute abnormality. Musculoskeletal: Median sternotomy wires. No acute osseous pathology. IMPRESSION: 1. Moderate size loculated appearing right pleural effusion with partial compressive atelectasis of the right lung. 2. Findings likely represent multilobar pneumonia. Clinical correlation and follow-up to resolution recommended. 3. Ill-defined density at the right lung base may represent proteinaceous content or infiltrate. A mass/neoplasm is not excluded. Thoracentesis may provide additional diagnostic information if clinically indicated. 4. Focal area of scarring versus nodule in the right apex. Attention on follow-up imaging recommended. 5.  Aortic Atherosclerosis (ICD10-I70.0). Electronically Signed   By: Elgie Collard M.D.   On: 07/30/2023 09:31   DG CHEST PORT 1 VIEW Result Date: 07/29/2023 CLINICAL DATA:  Follow-up pleural effusion EXAM: PORTABLE CHEST 1 VIEW COMPARISON:  07/27/2023 FINDINGS: Cardiac shadow is stable. Postsurgical changes are again  seen. Pacing device and dialysis catheter are again noted. Right-sided pleural effusion is again seen slightly less than that noted on the prior exam. This may be related to the upright positioning. Left lung is clear. IMPRESSION: Right-sided pleural effusion slightly less than that seen on the prior exam although this may be positionally related. Electronically Signed   By: Alcide Clever M.D.   On: 07/29/2023 18:06   US THORACENTESIS ASP PLEURAL SPACE W/IMG GUIDE Result Date: 07/27/2023 INDICATION: 73 year old female with cough, shortness of breath, loculated right pleural effusion. EXAM: ULTRASOUND GUIDED RIGHT THORACENTESIS MEDICATIONS: 10 mL 1% lidocaine COMPLICATIONS: None immediate. PROCEDURE: An ultrasound guided thoracentesis was thoroughly discussed with the patient and questions answered. The benefits, risks, alternatives and complications were also discussed. The patient understands and wishes to proceed with the procedure. Written consent was obtained. Ultrasound was performed to localize  and mark an adequate pocket of fluid in the right chest. The area was then prepped and draped in the normal sterile fashion. 1% Lidocaine was used for local anesthesia. Under ultrasound guidance a 6 Fr Safe-T-Centesis catheter was introduced. Thoracentesis was performed. The catheter was removed and a dressing applied. FINDINGS: A total of approximately 400 mL of amber fluid was removed. Samples were sent to the laboratory as requested by the clinical team. IMPRESSION: Successful ultrasound guided right thoracentesis yielding 400 mL of pleural fluid. Performed by: Loyce Dys PA-C Electronically Signed   By: Marliss Coots M.D.   On: 07/27/2023 16:13   DG Chest 1 View Result Date: 07/27/2023 CLINICAL DATA:  Pleural effusion EXAM: CHEST  1 VIEW COMPARISON:  X-ray 07/27/2023 earlier FINDINGS: Decreasing right pleural effusion with significant residual post thoracentesis. No pneumothorax. Persistent patchy right lung  opacities. Stable enlarged cardiopericardial silhouette with sternal wires. Right IJ double-lumen catheter in place once again with left upper chest pacemaker. Vascular congestion interstitial changes. No left-sided pleural effusion. IMPRESSION: Decreasing right pleural effusion with significant residual. No pneumothorax post thoracentesis. Electronically Signed   By: Karen Kays M.D.   On: 07/27/2023 12:30   DG Chest Port 1V same Day Result Date: 07/27/2023 CLINICAL DATA:  Shortness of breath. EXAM: PORTABLE CHEST 1 VIEW COMPARISON:  Chest radiograph dated 07/26/2023. FINDINGS: The heart size and mediastinal contours are within normal limits. Vascular calcifications are seen in the aortic arch. There is a moderate to large right pleural effusion with associated atelectasis/airspace disease. There is mild bilateral interstitial opacities. No pneumothorax. A right internal jugular central venous catheter tip overlies the right atrium. A left subclavian approach cardiac device is redemonstrated. IMPRESSION: 1. Moderate to large right pleural effusion with associated atelectasis/airspace disease. 2. Mild bilateral interstitial opacities may reflect pulmonary edema. Electronically Signed   By: Romona Curls M.D.   On: 07/27/2023 11:09   DG CHEST PORT 1 VIEW Result Date: 07/26/2023 CLINICAL DATA:  73 year old female with history of hypoxia and hemoptysis. EXAM: PORTABLE CHEST 1 VIEW COMPARISON:  Chest x-ray 07/24/2023. FINDINGS: Right internal jugular PermCath with tip projecting over the superior aspect of the right atrium. Left-sided pacemaker device in place with lead tips projecting over the right atrium and right ventricular apex. Status post median sternotomy for CABG and aortic valve replacement (a stented bioprosthesis is noted). Lung volumes are low. Irregular opacities throughout the periphery of the right mid to lower hemithorax most compatible with a partially loculated right-sided pleural effusion,  similar to the recent prior study. Ill-defined opacities throughout the right lung, particularly in the right mid to lower lung, which likely reflect areas of underlying atelectasis and/or consolidation. Left lung appears clear. No left pleural effusion. No pneumothorax. No evidence of pulmonary edema. Heart size is upper limits of normal. Atherosclerotic calcifications in the thoracic aorta. IMPRESSION: 1. Moderate partially loculated right-sided pleural effusion with extensive areas of atelectasis and/or consolidation throughout the right mid to lower lung, similar to the recent prior study. 2. Aortic atherosclerosis. 3. Postoperative changes and support apparatus, as above. Electronically Signed   By: Trudie Reed M.D.   On: 07/26/2023 05:36   CT HEAD WO CONTRAST ( ) Result Date: 07/25/2023 CLINICAL DATA:  AMS, dysarthria. Hx fall on AC. r/o ICH EXAM: CT HEAD WITHOUT CONTRAST TECHNIQUE: Contiguous axial images were obtained from the base of the skull through the vertex without intravenous contrast. RADIATION DOSE REDUCTION: This exam was performed according to the departmental dose-optimization program which includes automated  exposure control, adjustment of the mA and/or kV according to patient size and/or use of iterative reconstruction technique. COMPARISON:  CT head November 15, 2016. FINDINGS: Brain: No evidence of acute infarction, hemorrhage, hydrocephalus, extra-axial collection or mass lesion/mass effect. Vascular: No hyperdense vessel identified. Skull: No acute fracture. Sinuses/Orbits: Clear sinuses.  No acute orbital findings. Other: No mastoid effusions. IMPRESSION: No evidence of acute intracranial abnormality. Electronically Signed   By: Feliberto Harts M.D.   On: 07/25/2023 03:43   DG Chest Portable 1 View Result Date: 07/24/2023 CLINICAL DATA:  new cough EXAM: PORTABLE CHEST 1 VIEW COMPARISON:  Chest x-ray 05/01/2023 FINDINGS: Left chest wall dual lead pacemaker. Right chest wall  dialysis catheter with tip overlying the right atrium. The heart and mediastinal contours are unchanged. Atherosclerotic plaque. Coronary artery stents. Interval development of likely loculated moderate volume right pleural effusion. Diffuse hazy airspace opacity of the right lung. No pulmonary edema. No left pleural effusion. Left lung grossly unremarkable. No pneumothorax. No acute osseous abnormality. IMPRESSION: 1. Interval development of likely loculated moderate volume right pleural effusion. Diffuse hazy airspace opacity of the right lung may represent infection/inflammation versus atelectasis. 2.  Aortic Atherosclerosis (ICD10-I70.0). Electronically Signed   By: Tish Frederickson M.D.   On: 07/24/2023 22:30   VAS US DUPLEX DIALYSIS ACCESS (AVF, AVG) Result Date: 07/23/2023 DIALYSIS ACCESS Patient Name:  NERIYAH POUPARD  Date of Exam:   07/22/2023 Medical Rec #: [id number]          Accession #:    [id number] Date of Birth: 06-29-1951          Patient Gender: F Patient Age:   70 years Exam Location:  [facility] Procedure:      VAS US DUPLEX DIALYSIS ACCESS (AVF, AVG) Referring Phys: Coral Else --------------------------------------------------------------------------------  Reason for Exam: Routine follow up. Access Site: Right Upper Extremity. Access Type: Brachial-Basilic. History: CKD, HCC. Performing Technologist: Criss Rosales  Examination Guidelines: A complete evaluation includes B-mode imaging, spectral Doppler, color Doppler, and power Doppler as needed of all accessible portions of each vessel. Unilateral testing is considered an integral part of a complete examination. Limited examinations for reoccurring indications may be performed as noted.  Findings: Right Upper AVF +--------------------+----------+-----------------+--------+ AVF                 PSV (cm/s)Flow Vol (mL/min)Comments +--------------------+----------+-----------------+--------+ Native artery inflow   255            347                +--------------------+----------+-----------------+--------+ AVF Anastomosis        493                     0.19 cm  +--------------------+----------+-----------------+--------+  +------------+----------+-------------+----------+--------+ OUTFLOW VEINPSV (cm/s)Diameter (cm)Depth (cm)Describe +------------+----------+-------------+----------+--------+ Shoulder        81        0.70        0.83            +------------+----------+-------------+----------+--------+ Prox UA         79        0.66        0.94            +------------+----------+-------------+----------+--------+ Mid UA          83        0.67        0.77            +------------+----------+-------------+----------+--------+ Dist UA  446        0.71        0.61            +------------+----------+-------------+----------+--------+   Summary: - Patent right upper extremity arteriovenous fistula. - Velocities less than 100 cm/s noted - Volume Flow: 347 mL/min.  *See table(s) above for measurements and observations.  Diagnosing physician: Carolynn Sayers Electronically signed by Carolynn Sayers on 07/23/2023 at 8:39:55 AM.   --------------------------------------------------------------------------------   Final    ECHOCARDIOGRAM COMPLETE Result Date: 07/12/2023    ECHOCARDIOGRAM REPORT   Patient Name:   FIORELLA WALLISCH Date of Exam: 07/12/2023 Medical Rec #:  [id number]         Height:       62.0 in Accession #:    [id number]        Weight:       116.4 lb Date of Birth:  1951-05-30         BSA:          1.519 m Patient Age:    72 years          BP:           115/64 mmHg Patient Gender: F                 HR:           82 bpm. Exam Location:  Outpatient Procedure: Color Doppler and Cardiac Doppler Indications:    I50.9 CHF  History:        Patient has prior history of Echocardiogram examinations. CAD,                 Pacemaker and 19mm Edwards AVR, CKD on Dialysis,                  Arrythmias:PAFIB; Risk Factors:Breast CA and Dyslipidemia.  Sonographer:    L. 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 Global longitudinal strain was attempted. IMPRESSIONS  1. Left ventricular ejection fraction, by estimation, is 60 to 65%. The left ventricle has normal function. The left ventricle has no regional wall motion abnormalities. Left ventricular diastolic parameters are indeterminate.  2. Right ventricular systolic function is normal. The right ventricular size is normal. There is severely elevated pulmonary artery systolic pressure.  3. Left atrial size was moderately dilated.  4. Severe MAC mean gradient 13 peak 29 mmHg at HR of 92 bpm. The mitral valve is abnormal. Mild to moderate mitral valve regurgitation. Moderate mitral stenosis. Severe mitral annular calcification.  5. Cannot tell if PPM wire contributes to TR Device leads in RA/RV . The tricuspid valve is abnormal. Tricuspid valve regurgitation is severe.  6. 19 mm bioprosthetic AVR Calcified leaflets gradients have increased since prior TTE no AR/PVL . The aortic valve has been repaired/replaced. Aortic valve regurgitation is not visualized. No aortic stenosis is present.  7. The inferior vena cava is normal in size with greater than 50% respiratory variability, suggesting right atrial pressure of 3 mmHg. FINDINGS  Left Ventricle: Left ventricular ejection fraction, by estimation, is 60 to 65%. The left ventricle has normal function. The left ventricle has no regional wall motion abnormalities. Global longitudinal strain performed but not reported based on interpreter judgement due to suboptimal tracking. The left ventricular internal cavity size was normal in size. There is no left ventricular hypertrophy. Left ventricular diastolic parameters are indeterminate. Right Ventricle: The right ventricular size is normal. No increase in  right ventricular  wall thickness. Right ventricular systolic function is normal. There is severely elevated pulmonary artery systolic pressure. The tricuspid regurgitant velocity is 3.73 m/s, and with an assumed right atrial pressure of 15 mmHg, the estimated right ventricular systolic pressure is 70.7 mmHg. Left Atrium: Left atrial size was moderately dilated. Right Atrium: Right atrial size was normal in size. Pericardium: There is no evidence of pericardial effusion. Mitral Valve: Severe MAC mean gradient 13 peak 29 mmHg at HR of 92 bpm. The mitral valve is abnormal. There is severe thickening of the mitral valve leaflet(s). There is severe calcification of the mitral valve leaflet(s). Severe mitral annular calcification. Mild to moderate mitral valve regurgitation. Moderate mitral valve stenosis. MV peak gradient, 29.2 mmHg. The mean mitral valve gradient is 13.0 mmHg. Tricuspid Valve: Cannot tell if PPM wire contributes to TR Device leads in RA/RV. The tricuspid valve is abnormal. Tricuspid valve regurgitation is severe. No evidence of tricuspid stenosis. Aortic Valve: 19 mm bioprosthetic AVR Calcified leaflets gradients have increased since prior TTE no AR/PVL. The aortic valve has been repaired/replaced. Aortic valve regurgitation is not visualized. No aortic stenosis is present. Aortic valve mean gradient measures 13.0 mmHg. Aortic valve peak gradient measures 23.6 mmHg. Aortic valve area, by VTI measures 0.84 cm. Pulmonic Valve: The pulmonic valve was normal in structure. Pulmonic valve regurgitation is not visualized. No evidence of pulmonic stenosis. Aorta: The aortic root is normal in size and structure. Venous: The inferior vena cava is normal in size with greater than 50% respiratory variability, suggesting right atrial pressure of 3 mmHg. IAS/Shunts: No atrial level shunt detected by color flow Doppler.  LEFT VENTRICLE PLAX 2D LVIDd:         3.00 cm     Diastology LVIDs:         2.00 cm     LV e' medial:    3.50 cm/s  LV PW:         0.90 cm     LV E/e' medial:  38.6 LV IVS:        1.00 cm     LV e' lateral:   11.00 cm/s LVOT diam:     1.70 cm     LV E/e' lateral: 12.3 LV SV:         35 LV SV Index:   23 LVOT Area:     2.27 cm  LV Volumes (MOD) LV vol d, MOD A2C: 36.5 ml LV vol d, MOD A4C: 15.1 ml LV vol s, MOD A2C: 4.4 ml LV vol s, MOD A4C: 6.4 ml LV SV MOD A2C:     32.1 ml LV SV MOD A4C:     15.1 ml LV SV MOD BP:      19.2 ml RIGHT VENTRICLE RV Basal diam:  3.40 cm RV S prime:     4.73 cm/s LEFT ATRIUM             Index        RIGHT ATRIUM           Index LA diam:        4.10 cm 2.70 cm/m   RA Area:     11.90 cm LA Vol (A2C):   81.0 ml 53.33 ml/m  RA Volume:   26.70 ml  17.58 ml/m LA Vol (A4C):   66.8 ml 43.98 ml/m LA Biplane Vol: 77.1 ml 50.76 ml/m  AORTIC VALVE  PULMONIC VALVE AV Area (Vmax):    0.81 cm      PV Vmax:          0.84 m/s AV Area (Vmean):   0.84 cm      PV Peak grad:     2.8 mmHg AV Area (VTI):     0.84 cm      PR End Diast Vel: 8.18 msec AV Vmax:           243.00 cm/s AV Vmean:          169.000 cm/s AV VTI:            0.415 m AV Peak Grad:      23.6 mmHg AV Mean Grad:      13.0 mmHg LVOT Vmax:         86.30 cm/s LVOT Vmean:        62.700 cm/s LVOT VTI:          0.154 m LVOT/AV VTI ratio: 0.37  AORTA Ao Root diam: 2.50 cm Ao Asc diam:  2.30 cm MITRAL VALVE                TRICUSPID VALVE MV Area VTI:  0.97 cm      TR Peak grad:   55.7 mmHg MV Peak grad: 29.2 mmHg     TR Vmax:        373.00 cm/s MV Mean grad: 13.0 mmHg MV Vmax:      2.70 m/s      SHUNTS MV Vmean:     161.0 cm/s    Systemic VTI:  0.15 m MV E velocity: 135.00 cm/s  Systemic Diam: 1.70 cm MV A velocity: 233.00 cm/s MV E/A ratio:  0.58 Charlton Haws MD Electronically signed by Charlton Haws MD Signature Date/Time: 07/12/2023/9:44:23 AM    Final   [2 weeks]  Assessment:   Bonita C. 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 pylori positive), recent hospitalization earlier this month with influenza A, pleural effusion, weakness, AMS, now admitted again on 08/04/2023 with acute metabolic encephalopathy and generalized weakness and deconditioning. Also with acute on chronic anemia with episode of rectal bleeding, GI  consulted for further evaluation.   Acute on chronic anemia/GI bleeding: reported rectal bleeding with maroon-colored stools over the last few weeks.  Episode of maroon-colored stool 2 days ago associated with large caliber stool. Overnight however, stools more black looking. Last one at 245am. No varices on EGD in January 2024. She had small hh and fundic gland polyps.  Last colonoscopy 2021 with tubular adenoma.  Capsule endoscopy 2014 normal. -Eliquis currently on hold, last dose January 12 -Hemoglobin 9.5 on admission, 7.0 yesterday evening. Received 1 unit of packed red blood cells on January 15, with no change in hemoglobin posttransfusion. No Hgb on file for this morning.  -developed Afib with RVR during dialysis last evening associated with chest pain, spontaneously converted to sinus tachycardia, received one unit of blood after this event. K and Mag repleted.  -spouse reluctant for procedures requiring sedation due to cognitive issues afterwards in the past. She may require endoscopic evaluation due to concern for ongoing GI bleeding. She is not an ideal candidate for [facility]. To  discuss with Dr. Levon Hedger. Ultimately would need egd/colonoscopy. -update CBC today, consider transfusion over 8 given cardiac events overnight.     Nausea/vomiting/dysphagia: -Since acute illness with influenza on January 1, primarily posttussive but also noted worse coughing with meals.  -Chronic pill dysphagia.  EGD 07/2022 in part due to dysphagia and no finding found.  -SLP evaluation, pending  -IV PPI twice daily  Chronic constipation: -started back on lactulose this admission. Was started on during admission couple of weeks ago due to slightly elevated ammonia level in the 40s. Has been normal this admission.  MASH cirrhosis: Followed at [facility] Scheduled to see them 10/15/23. No history of esophageal varices on EGD January 2024. -MELD 3.0 is 39 on 08/05/23.MELD 3.0 33 on 08/07/23.  -continue outpatient follow up with [facility]      LOS: 4 days   Leanna Battles. [facility] [phone] 1/16/20258:45 AM

## 2023-08-08 NOTE — Progress Notes (Signed)
PROGRESS NOTE  [name] [name], is a 73 y.o. female, DOB - 1951/01/29, LKG:[id number]  Admit date - 08/04/2023   Admitting Physician Gertha Calkin, MD  Outpatient Primary MD for the patient is Aliene Beams, MD  LOS - 4  Chief Complaint  Patient presents with   Failure To Thrive      Brief Narrative:  -73 y.o. 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, RCC-s/p right nephrectomy, remote history of lymphoma, possible NASH Liver Cirrhosis admitted on 08/04/2023 with acute metabolic encephalopathy and generalized weakness and deconditioning    -Assessment and Plan: 1)acute metabolic encephalopathy and generalized weakness and deconditioning-- -multifactorial in the setting of influenza infection, NASH liver cirrhosis,  ESRD with missed hemodialysis sessions and OSA with chronic hypercapnia with intolerance of BiPAP/NIV --- Use BiPAP/NIV as tolerated -Nephrology consult for hemodialysis -Supportive care for influenza--patient did not tolerate Tamiflu 08/06/23 -- Mentation appears to be back to baseline according to patient's husband who is at bedside. -Expressing to be weak and tired.  2)ESRD----M/Tu/Th/F schedule----as outpatient -last HD prior to this admission 07/31/2023 -Patient usually gets hemodialysis at least 4 times a week as outpt -Nephrology consult requested for HD--nephrologist recommends MWF schedule while inpatient -Had hemodialysis via 08/07/2023 -Nephrology team plans to repeat HD on 08/09/2023  3)Liver cirrhosis secondary to NASH (HCC) -Serum ammonia is not elevated -INR is 4.0 watch closely -INR down to 2.7 recently; continue to follow trend and follow GI service recommendation.  4)Acute on chronic chronic anemia of ESRD/?? ABLA superimposed on chronic anemia of ESRD =-Hgb  11.4 >> 9.4  -Serum iron and ferritin are not low On 08/06/23 she had Had dark  stool with some burgundy outline---Hemoccult blood is positive ----Holding Eliquis for now---Last Dose of Eliquis 08/04/23 at 0900 am -on 07/31/23 she was seen by GI at [facility] (Dr. Lavon Paganini) and at the moment no recommendations for inpatient endoscopic evaluation provided. -Gi input/consult at [facility] requested -Defer decision on ESA/Procrit to nephrology team -Continue to hold blood thinners -In the setting of chest discomfort for and transient A-fib with RVR patient received 1 unit of PRBCs overnight and hemoglobin is 8.6 currently. -Will continue closely monitoring trend and plan if needed for another unit of PRBCs during hemodialysis on 08/09/2023.  5)PAFib/s/p PPM--Eliquis on hold due to bleeding concerns in the setting of liver cirrhosis with elevated INR--please see #4 above -Last Dose of Eliquis 08/04/23 at 0900 am -- Continue metoprolol for rate control  6)Chronic diastolic dysfunction CHF/HFpEF--- continue Toprol-XL -Use hemodialysis sessions to address volume status -Next HD 08/07/2023  7)CAD history of CABG, multiple PCI / HLD:  -Continue pravastatin and aspirin  --Eliquis on hold as above #5 (Last Dose of Eliquis 08/04/23 at 0900 am) ---- Patient gets outpatient Repatha and Vascepa  8)DM2-A1c 6.7 reflecting good diabetic control PTA --Hold PTA insulin pump-- -c/n  Semglee 3 units nightly -Sliding scale with Apidra as ordered -Diabetic educator input appreciated -Continue to follow CBG fluctuation.  9)Generalized Weakness and Deconditioning/FTT--- physical therapy eval appreciated recommends SNF rehab -- Husband is not sure that they want to go to SNF rehab  10) moderate protein calorie malnutrition -Associated with chronic medical problems -Nepro recommended.  11) left upper extremity swelling -Will order left upper extremity ultrasound to rule out any clots. -Continue keeping limb elevated as much as possible. -Okay to apply warm compresses as needed.  12)  hypokalemia/hypomagnesemia -Magnesium has been repleted -Potassium to be readdressed with dialysis on 08/09/2023.  History RCC, right nephrectomy: Outpatient surveillance Remote history lymphoma: outpatient surveillance H/o Breast Ca/DCIS--previously treated  Status is: Inpatient   Disposition: The patient is from: Home              Anticipated d/c is to: Home              Anticipated d/c date is: 2 days              Patient currently is not medically stable to d/c. Barriers: Not Clinically Stable-   Code Status :  -  Code Status: Full Code   Family Communication:   (patient is alert, awake and coherent)  Discussed with Husband updated at bedside 08/07/2023  DVT Prophylaxis  :   - SCDs /subcu heparin for DVT prophylaxis while Eliquis is on hold ---Last Dose of Eliquis 08/04/23 at 0900 am  Lab Results  Component Value Date   PLT 215 08/07/2023   Inpatient Medications  Scheduled Meds:  amiodarone  150 mg Intravenous Once   aspirin EC  81 mg Oral Q breakfast   Chlorhexidine Gluconate Cloth  6 each Topical Daily   Chlorhexidine Gluconate Cloth  6 each Topical Q0600   doxycycline  100 mg Oral Q12H   feeding supplement (NEPRO CARB STEADY)  237 mL Oral BID BM   gabapentin  100 mg Oral QHS   insulin glargine-yfgn  3 Units Subcutaneous Daily   insulin glulisine  0-5 Units Subcutaneous QHS   insulin glulisine  0-9 Units Subcutaneous TID WC   insulin glulisine  2 Units Subcutaneous TID WC   lactulose  10 g Oral BID   metoprolol tartrate  2.5 mg Intravenous Once   metoprolol tartrate  12.5 mg Oral BID   pantoprazole (PROTONIX) IV  40 mg Intravenous Q12H   pravastatin  20 mg Oral q1800   sodium chloride flush  3 mL Intravenous Q12H   Continuous Infusions:  albumin human     amiodarone Stopped (08/08/23 0245)   PRN Meds:.albumin human, alteplase, guaiFENesin-dextromethorphan, heparin, ondansetron (ZOFRAN) IV, mouth rinse, phenol   Anti-infectives (From admission, onward)     Start     Dose/Rate Route Frequency Ordered Stop   08/08/23 2200  doxycycline (VIBRA-TABS) tablet 100 mg        100 mg Oral Every 12 hours 08/08/23 0902     08/05/23 0000  doxycycline (VIBRAMYCIN) 100 mg in dextrose 5 % 250 mL IVPB  Status:  Discontinued        100 mg 125 mL/hr over 120 Minutes Intravenous Every 12 hours 08/04/23 2312 08/08/23 0902   08/04/23 2315  doxycycline (VIBRAMYCIN) 200 mg in dextrose 5 % 250 mL IVPB  Status:  Discontinued        200 mg 125 mL/hr over 120 Minutes Intravenous  Once 08/04/23 2312 08/04/23 2314      Subjective: Ileana Coulon chronically ill in appearance; generally weak.  Overnight with transient episode of A-fib with RVR.  No overt bleeding.  Denies chest pain, no nausea, no vomiting.  Objective: Vitals:   08/08/23 0900 08/08/23 1000 08/08/23 1131 08/08/23 1529  BP:  (!) 127/45    Pulse: 99 98    Resp: 12 (!) 23    Temp:   98.2 F (36.8 C) 98 F (36.7 C)  TempSrc:   Oral Oral  SpO2: 94% 95%    Weight:      Height:  Intake/Output Summary (Last 24 hours) at 08/08/2023 1713 Last data filed at 08/08/2023 1242 Gross per 24 hour  Intake 1290.99 ml  Output 702 ml  Net 588.99 ml   Filed Weights   08/06/23 0358 08/07/23 0530 08/07/23 1650  Weight: 47.7 kg 47.6 kg 47.9 kg    Physical Exam General exam: Alert, awake, oriented x 3; expressed feeling better today and denying chest pain.  Overnight with transient episode of A-fib with RVR and chest discomfort.  No nausea, no vomiting, no signs of overt bleeding. Respiratory system: Still short winded with activity but no using accessory muscles and demonstrating good saturation on room air. Cardiovascular system: Rate controlled and currently sinus; no rubs, no gallops.  Positive murmur. Gastrointestinal system: Abdomen is nondistended, soft and nontender. No organomegaly or masses felt. Normal bowel sounds heard. Central nervous system: Moving 4 limbs spontaneously; generally weak.  No  focal neurological deficits. Extremities: No cyanosis or clubbing; left upper extremity swelling appreciated. Skin: No petechiae; multiple bruises appreciated (including left facial bruise following recent fall.  Right IJ hemodialysis catheter in place and in situ pacemaker. Psychiatry: Judgement and insight appear normal.  Flat affect appreciated.  Data Reviewed: I have personally reviewed following labs and imaging studies  CBC: Recent Labs  Lab 08/04/23 1600 08/05/23 0323 08/06/23 1828 08/07/23 0440 08/07/23 2121 08/08/23 0547  WBC 10.6* 12.3* 12.2* 11.3*  --   --   NEUTROABS 8.4*  --   --   --   --   --   HGB 9.5* 9.4* 7.1* 7.0* 7.0* 8.6*  HCT 28.0* 25.8* 21.4* 21.8* 21.1* 25.9*  MCV 91.2 92.5 92.6 95.2  --   --   PLT 186 209 201 215  --   --    Basic Metabolic Panel: Recent Labs  Lab 08/04/23 1600 08/05/23 0323 08/06/23 0345 08/07/23 0440 08/07/23 2121  NA 128* 130* 128* 129* 129*  K 3.8 4.0 3.8 3.7 3.3*  CL 93* 95* 91* 92* 94*  CO2 24 21* 19* 24 26  GLUCOSE 202* 193* 350* 190* 183*  BUN 85* 91* 33* 57* 28*  CREATININE 2.93* 2.79* 1.38* 2.30* 1.50*  CALCIUM 9.6 9.4 9.1 9.8 9.2  MG  --   --   --   --  1.6*  PHOS  --   --  2.6 3.5 2.5   GFR: Estimated Creatinine Clearance: 26.4 mL/min (A) (by C-G formula based on SCr of 1.5 mg/dL (H)).  Liver Function Tests: Recent Labs  Lab 08/04/23 1600 08/05/23 0323 08/06/23 0345 08/07/23 0440  AST 24 21  --  23  ALT 16 14  --  11  ALKPHOS 241* 197*  --  155*  BILITOT 2.0* 2.2*  --  2.4*  PROT 7.0 6.5  --  7.0  ALBUMIN 3.0* 2.8* 3.6 3.4*  3.3*   Cardiac Enzymes: Recent Labs  Lab 08/04/23 2148  CKTOTAL 18*   Recent Results (from the past 240 hours)  Blood Culture (routine x 2)     Status: None (Preliminary result)   Collection Time: 08/04/23  4:00 PM   Specimen: Left Antecubital; Blood  Result Value Ref Range Status   Specimen Description   Final    LEFT ANTECUBITAL BOTTLES DRAWN AEROBIC AND ANAEROBIC    Special Requests Blood Culture adequate volume  Final   Culture   Final    NO GROWTH 4 DAYS Performed at [facility], [address], [place], [place] [address]    Report Status PENDING  Incomplete  Blood Culture (routine x 2)     Status: None (Preliminary result)   Collection Time: 08/04/23  4:00 PM   Specimen: BLOOD LEFT ARM  Result Value Ref Range Status   Specimen Description BLOOD LEFT ARM BOTTLES DRAWN AEROBIC AND ANAEROBIC  Final   Special Requests Blood Culture adequate volume  Final   Culture   Final    NO GROWTH 4 DAYS Performed at [facility], [address] [place], [place] [address]    Report Status PENDING  Incomplete  MRSA Next Gen by PCR, Nasal     Status: None   Collection Time: 08/05/23 12:19 AM   Specimen: Nasal Mucosa; Nasal Swab  Result Value Ref Range Status   MRSA by PCR Next Gen NOT DETECTED NOT DETECTED Final    Comment: (NOTE) The GeneXpert MRSA Assay (FDA approved for NASAL specimens only), is one component of a comprehensive MRSA colonization surveillance program. It is not intended to diagnose MRSA infection nor to guide or monitor treatment for MRSA infections. Test performance is not FDA approved in patients less than 59 years old. Performed at [facility], [address] [place], [place] [address]     Radiology Studies: No results found.  Scheduled Meds:  amiodarone  150 mg Intravenous Once   aspirin EC  81 mg Oral Q breakfast   Chlorhexidine Gluconate Cloth  6 each Topical Daily   Chlorhexidine Gluconate Cloth  6 each Topical Q0600   doxycycline  100 mg Oral Q12H   feeding supplement (NEPRO CARB STEADY)  237 mL Oral BID BM   gabapentin  100 mg Oral QHS   insulin glargine-yfgn  3 Units Subcutaneous Daily   insulin glulisine  0-5 Units Subcutaneous QHS   insulin glulisine  0-9 Units Subcutaneous TID WC   insulin glulisine  2 Units Subcutaneous TID WC   lactulose  10 g Oral BID   metoprolol tartrate  2.5 mg Intravenous Once   metoprolol  tartrate  12.5 mg Oral BID   pantoprazole (PROTONIX) IV  40 mg Intravenous Q12H   pravastatin  20 mg Oral q1800   sodium chloride flush  3 mL Intravenous Q12H   Continuous Infusions:  albumin human     amiodarone Stopped (08/08/23 0245)    LOS: 4 days   Vassie Loll M.D on 08/08/2023 at 5:13 PM  Go to [email or web address] - for contact info  [facility] - Office  [phone]  If 7PM-7AM, please contact night-coverage [email or web address] 08/08/2023, 5:13 PM

## 2023-08-08 NOTE — Progress Notes (Addendum)
Patient ID: [name] [name], female   DOB: [birth year], 73 y.o.   MRN: [id number]  Outpatient HD orders: [facility] MTTF. 3 hours.  TDC.  Flow rates: 400/8.3.  EDW 46 kg   3K, lactate 45.  Heparin: None.  Meds: Mircera 75 mcg every 4 weeks.   Assessment/Plan: ESRD -Will keep on MWF schedule for HD while admitted, tolerated dialysis on Wednesday but worsened 2/even with no ultrafiltration patient complained of chest pressure midsternal with stable vitals but heart rate was 107.  Chest pressure resolved with lowering of the blood flow rate.  She went into A-fib RVR and blood pressure dropped to 99/45 6 minutes later.  Dialysis was stopped at that time.  Planning on a 3-hour treatment Friday, lower blood flows and no ultrafiltration.  Patient still makes a decent amount of urine but does not appear to be floridly volume overloaded.  Did not tolerate IV KCl, will give a single dose of K-Dur 40 milliequivalents Secondary Hyperparathyroidism/Hyperphosphatemia - continue with home meds  Loculated right pleural effusion  -per primary -receiving doxycycline  PAF -eliquis on hold given elevated INR, per primary.  A-fib RVR shortly after 2 hours on dialysis Wednesday with hypotension noted as well, started on amiodarone bolus followed by drip.  Patient actually spontaneously converted prior to starting the drip.  Amiodarone was discontinued on  continued on metoprolol for rate control.  DM1 -pump on hold, per primary  Disposition - pt was not able to get back and forth to HD after discharge on 08/01/23.  She has had FTT and has not improved much.  Will likely require SNF placement.  S: Feeling a little better this morning.  Patient went to A-fib RVR with hypotension shortly after the 2-hour mark on dialysis Wednesday.  Cough but denies any increased shortness of breath.  O:BP (!) 118/36   Pulse 97   Temp 98.1 F (36.7 C) (Oral)   Resp 15   Ht 5\' 2"  (1.575 m)   Wt 47.9 kg   SpO2 97%   BMI 19.31 kg/m    Intake/Output Summary (Last 24 hours) at 08/08/2023 0858 Last data filed at 08/08/2023 0544 Gross per 24 hour  Intake 810.99 ml  Output 751 ml  Net 59.99 ml   Intake/Output: I/O last 3 completed shifts: In: 1728.6 [P.O.:240; I.V.:37.5; Blood:410; IV Piggyback:1041.1] Out: 751 [Urine:50; Other:700; Stool:1]  Intake/Output this shift:  No intake/output data recorded. Weight change: 0.3 kg Gen: NAD CVS: tachy Resp: CTA Abd: +BS, soft, NT/ND Ext: no edema Access: Left arm brachiobasilic fistula not transposed positive bruit but does not augment well, right IJ tunnel catheter  Recent Labs  Lab 08/04/23 1600 08/05/23 0323 08/06/23 0345 08/07/23 0440 08/07/23 2121  NA 128* 130* 128* 129* 129*  K 3.8 4.0 3.8 3.7 3.3*  CL 93* 95* 91* 92* 94*  CO2 24 21* 19* 24 26  GLUCOSE 202* 193* 350* 190* 183*  BUN 85* 91* 33* 57* 28*  CREATININE 2.93* 2.79* 1.38* 2.30* 1.50*  ALBUMIN 3.0* 2.8* 3.6 3.4*  3.3*  --   CALCIUM 9.6 9.4 9.1 9.8 9.2  PHOS  --   --  2.6 3.5 2.5  AST 24 21  --  23  --   ALT 16 14  --  11  --    Liver Function Tests: Recent Labs  Lab 08/04/23 1600 08/05/23 0323 08/06/23 0345 08/07/23 0440  AST 24 21  --  23  ALT 16 14  --  11  ALKPHOS 241*  197*  --  155*  BILITOT 2.0* 2.2*  --  2.4*  PROT 7.0 6.5  --  7.0  ALBUMIN 3.0* 2.8* 3.6 3.4*  3.3*   No results for input(s): "LIPASE", "AMYLASE" in the last 168 hours. Recent Labs  Lab 08/04/23 2148  AMMONIA 35   CBC: Recent Labs  Lab 08/04/23 1600 08/05/23 0323 08/06/23 1828 08/07/23 0440 08/07/23 2121  WBC 10.6* 12.3* 12.2* 11.3*  --   NEUTROABS 8.4*  --   --   --   --   HGB 9.5* 9.4* 7.1* 7.0* 7.0*  HCT 28.0* 25.8* 21.4* 21.8* 21.1*  MCV 91.2 92.5 92.6 95.2  --   PLT 186 209 201 215  --    Cardiac Enzymes: Recent Labs  Lab 08/04/23 2148  CKTOTAL 18*   CBG: Recent Labs  Lab 08/07/23 0746 08/07/23 1224 08/07/23 1546 08/07/23 2118 08/08/23 0741  GLUCAP 166* 252* 217* 160* 180*     Iron Studies:  Recent Labs    08/06/23 0345  IRON 56  TIBC 161*  FERRITIN 2,002*   Studies/Results: No results found.  amiodarone  150 mg Intravenous Once   aspirin EC  81 mg Oral Q breakfast   Chlorhexidine Gluconate Cloth  6 each Topical Daily   Chlorhexidine Gluconate Cloth  6 each Topical Q0600   feeding supplement (NEPRO CARB STEADY)  237 mL Oral BID BM   gabapentin  100 mg Oral QHS   insulin glargine-yfgn  3 Units Subcutaneous Daily   insulin glulisine  0-5 Units Subcutaneous QHS   insulin glulisine  0-9 Units Subcutaneous TID WC   insulin glulisine  2 Units Subcutaneous TID WC   lactulose  10 g Oral BID   metoprolol tartrate  2.5 mg Intravenous Once   metoprolol tartrate  12.5 mg Oral BID   pantoprazole (PROTONIX) IV  40 mg Intravenous Q12H   pravastatin  20 mg Oral q1800   sodium chloride flush  3 mL Intravenous Q12H    BMET    Component Value Date/Time   NA 129 (L) 08/07/2023 2121   NA 142 04/16/2017 1031   NA 142 12/13/2016 1427   K 3.3 (L) 08/07/2023 2121   K 4.4 12/13/2016 1427   CL 94 (L) 08/07/2023 2121   CO2 26 08/07/2023 2121   CO2 28 12/13/2016 1427   GLUCOSE 183 (H) 08/07/2023 2121   GLUCOSE 107 12/13/2016 1427   BUN 28 (H) 08/07/2023 2121   BUN 30 (H) 04/16/2017 1031   BUN 33.3 (H) 12/13/2016 1427   CREATININE 1.50 (H) 08/07/2023 2121   CREATININE 1.63 (H) 11/07/2017 1406   CREATININE 1.3 (H) 12/13/2016 1427   CALCIUM 9.2 08/07/2023 2121   CALCIUM 10.5 (H) 12/31/2018 1306   CALCIUM 10.3 12/13/2016 1427   GFRNONAA 37 (L) 08/07/2023 2121   GFRNONAA 33 (L) 11/07/2017 1406   GFRAA 34 (L) 04/25/2020 1015   GFRAA 38 (L) 11/07/2017 1406   CBC    Component Value Date/Time   WBC 11.3 (H) 08/07/2023 0440   RBC 2.29 (L) 08/07/2023 0440   HGB 7.0 (L) 08/07/2023 2121   HGB 12.4 09/16/2019 1533   HGB 13.4 12/13/2016 1427   HCT 21.1 (L) 08/07/2023 2121   HCT 38.4 09/16/2019 1533   HCT 40.3 12/13/2016 1427   PLT 215 08/07/2023 0440   PLT 120  (L) 09/16/2019 1533   MCV 95.2 08/07/2023 0440   MCV 92 09/16/2019 1533   MCV 88.8 12/13/2016 1427   MCH 30.6  08/07/2023 0440   MCHC 32.1 08/07/2023 0440   RDW 14.4 08/07/2023 0440   RDW 13.2 09/16/2019 1533   RDW 14.6 (H) 12/13/2016 1427   LYMPHSABS 1.0 08/04/2023 1600   LYMPHSABS 1.2 12/13/2016 1427   MONOABS 1.1 (H) 08/04/2023 1600   MONOABS 0.8 12/13/2016 1427   EOSABS 0.0 08/04/2023 1600   EOSABS 0.2 12/13/2016 1427   BASOSABS 0.1 08/04/2023 1600   BASOSABS 0.1 12/13/2016 1427

## 2023-08-09 DIAGNOSIS — R627 Adult failure to thrive: Secondary | ICD-10-CM | POA: Diagnosis not present

## 2023-08-09 DIAGNOSIS — R5383 Other fatigue: Secondary | ICD-10-CM | POA: Diagnosis not present

## 2023-08-09 DIAGNOSIS — N186 End stage renal disease: Secondary | ICD-10-CM | POA: Diagnosis not present

## 2023-08-09 DIAGNOSIS — I480 Paroxysmal atrial fibrillation: Secondary | ICD-10-CM | POA: Diagnosis not present

## 2023-08-09 DIAGNOSIS — I25110 Atherosclerotic heart disease of native coronary artery with unstable angina pectoris: Secondary | ICD-10-CM

## 2023-08-09 LAB — CULTURE, BLOOD (ROUTINE X 2)
Culture: NO GROWTH
Culture: NO GROWTH
Special Requests: ADEQUATE
Special Requests: ADEQUATE

## 2023-08-09 LAB — CBC
HCT: 25.1 % — ABNORMAL LOW (ref 36.0–46.0)
Hemoglobin: 8.4 g/dL — ABNORMAL LOW (ref 12.0–15.0)
MCH: 30.7 pg (ref 26.0–34.0)
MCHC: 33.5 g/dL (ref 30.0–36.0)
MCV: 91.6 fL (ref 80.0–100.0)
Platelets: 183 10*3/uL (ref 150–400)
RBC: 2.74 MIL/uL — ABNORMAL LOW (ref 3.87–5.11)
RDW: 14.7 % (ref 11.5–15.5)
WBC: 11.2 10*3/uL — ABNORMAL HIGH (ref 4.0–10.5)
nRBC: 0 % (ref 0.0–0.2)

## 2023-08-09 LAB — RENAL FUNCTION PANEL
Albumin: 3 g/dL — ABNORMAL LOW (ref 3.5–5.0)
Anion gap: 10 (ref 5–15)
BUN: 47 mg/dL — ABNORMAL HIGH (ref 8–23)
CO2: 22 mmol/L (ref 22–32)
Calcium: 9.4 mg/dL (ref 8.9–10.3)
Chloride: 92 mmol/L — ABNORMAL LOW (ref 98–111)
Creatinine, Ser: 2.33 mg/dL — ABNORMAL HIGH (ref 0.44–1.00)
GFR, Estimated: 22 mL/min — ABNORMAL LOW (ref >60)
Glucose, Bld: 187 mg/dL — ABNORMAL HIGH (ref 70–99)
Phosphorus: 2.8 mg/dL (ref 2.5–4.6)
Potassium: 4 mmol/L (ref 3.5–5.1)
Sodium: 124 mmol/L — ABNORMAL LOW (ref 135–145)

## 2023-08-09 LAB — GLUCOSE, CAPILLARY
Glucose-Capillary: 224 mg/dL — ABNORMAL HIGH (ref 70–99)
Glucose-Capillary: 162 mg/dL — ABNORMAL HIGH (ref 70–99)
Glucose-Capillary: 201 mg/dL — ABNORMAL HIGH (ref 70–99)

## 2023-08-09 LAB — MAGNESIUM: Magnesium: 2.3 mg/dL (ref 1.7–2.4)

## 2023-08-09 MED ORDER — DOXYCYCLINE HYCLATE 100 MG PO TABS: 100.0000 mg | ORAL_TABLET | Freq: Two times a day (BID) | ORAL | 0 refills | Status: AC

## 2023-08-09 MED ORDER — METOPROLOL TARTRATE 25 MG PO TABS: 12.5000 mg | ORAL_TABLET | Freq: Two times a day (BID) | ORAL | 2 refills | Status: DC

## 2023-08-09 MED ORDER — LACTULOSE 10 GM/15ML PO SOLN: 10.0000 g | Freq: Two times a day (BID) | ORAL | 1 refills | Status: DC

## 2023-08-09 MED ORDER — INSULIN GLARGINE 100 UNIT/ML SOLOSTAR PEN: 4.0000 [IU] | PEN_INJECTOR | Freq: Every day | SUBCUTANEOUS | Status: DC

## 2023-08-09 MED ORDER — PANTOPRAZOLE SODIUM 40 MG PO TBEC: 40.0000 mg | DELAYED_RELEASE_TABLET | Freq: Two times a day (BID) | ORAL | 1 refills | Status: DC

## 2023-08-09 NOTE — Care Management Important Message (Signed)
Important Message  Patient Details  Name: [name] [name] MRN: [id number] Date of Birth: 1950-08-10   Important Message Given:  Yes - Medicare IM     Corey Harold 08/09/2023, 1:13 PM

## 2023-08-09 NOTE — TOC Transition Note (Signed)
Transition of Care [facility]) - Discharge Note   Patient Details  Name: [name] [name] MRN: [id number] Date of Birth: [birth year]  [facility] CM/SW Contact:  Elliot Gault, LCSW Phone Number: 08/09/2023, 12:09 PM   Clinical Narrative:     TOC following. MD anticipating dc home after HD today. Spoke with pt and her husband at bedside to review dc plan.  They continue to request dc home with [facility] PT/OT. Orders requested from MD. Updated Kandee Keen at [facility].  No further TOC needs for dc.  Final next level of care: Home w Home Health Services Barriers to Discharge: Barriers Resolved   Patient Goals and CMS Choice Patient states their goals for this hospitalization and ongoing recovery are:: return home CMS Medicare.gov Compare Post Acute Care list provided to:: Patient Represenative (must comment) Choice offered to / list presented to : Spouse      Discharge Placement                       Discharge Plan and Services Additional resources added to the After Visit Summary for       Post Acute Care Choice: Resumption of Svcs/PTA Provider                               Social Drivers of Health (SDOH) Interventions SDOH Screenings   Food Insecurity: No Food Insecurity (08/06/2023)  Housing: Low Risk  (08/06/2023)  Transportation Needs: No Transportation Needs (08/06/2023)  Utilities: Not At Risk (08/06/2023)  Depression (PHQ2-9): Low Risk  (09/08/2018)  Financial Resource Strain: Low Risk  (01/02/2023)   Received from [facility], [facility]  Physical Activity: Insufficiently Active (03/17/2021)   Received from [facility]  Social Connections: Socially Isolated (08/06/2023)  Stress: Stress Concern Present (03/17/2021)   Received from [facility], [facility]  Tobacco Use: Low Risk  (08/04/2023)     Readmission Risk Interventions    08/05/2023    1:00 PM   Readmission Risk Prevention Plan  Transportation Screening Complete  HRI or Home Care Consult Complete  Social Work Consult for Recovery Care Planning/Counseling Complete  Palliative Care Screening Complete  Medication Review Oceanographer) Complete

## 2023-08-09 NOTE — Progress Notes (Signed)
   08/09/23 1234  Assess: MEWS Score  Temp 98.4 F (36.9 C)  BP (!) 150/54  Pulse Rate 95  Resp 10  SpO2 100 %  O2 Device Room Air  Assess: MEWS Score  MEWS Temp 0  MEWS Systolic 0  MEWS Pulse 0  MEWS RR 1  MEWS LOC 0  MEWS Score 1  MEWS Score Color Green  Assess: SIRS CRITERIA  SIRS Temperature  0  SIRS Respirations  0  SIRS Pulse 1  SIRS WBC 0  SIRS Score Sum  1

## 2023-08-09 NOTE — Progress Notes (Signed)
Chart Reviewed. GI following peripherally given patient and family wishes to hold off on endoscopic evaluation for anemia at this time. Team and patient/family are aware that given her co morbidities that if she decides to undergo procedures that these will need to be performed at [facility]. They have been asked to let primary team know if they would like transfer for procedures.   CBC    Component Value Date/Time   WBC 11.2 (H) 08/09/2023 0513   RBC 2.74 (L) 08/09/2023 0513   HGB 8.4 (L) 08/09/2023 0513   HGB 12.4 09/16/2019 1533   HGB 13.4 12/13/2016 1427   HCT 25.1 (L) 08/09/2023 0513   HCT 38.4 09/16/2019 1533   HCT 40.3 12/13/2016 1427   PLT 183 08/09/2023 0513   PLT 120 (L) 09/16/2019 1533   MCV 91.6 08/09/2023 0513   MCV 92 09/16/2019 1533   MCV 88.8 12/13/2016 1427   MCH 30.7 08/09/2023 0513   MCHC 33.5 08/09/2023 0513   RDW 14.7 08/09/2023 0513   RDW 13.2 09/16/2019 1533   RDW 14.6 (H) 12/13/2016 1427   LYMPHSABS 1.0 08/04/2023 1600   LYMPHSABS 1.2 12/13/2016 1427   MONOABS 1.1 (H) 08/04/2023 1600   MONOABS 0.8 12/13/2016 1427   EOSABS 0.0 08/04/2023 1600   EOSABS 0.2 12/13/2016 1427   BASOSABS 0.1 08/04/2023 1600   BASOSABS 0.1 12/13/2016 1427       Latest Ref Rng & Units 08/09/2023    5:13 AM 08/07/2023    9:21 PM 08/07/2023    4:40 AM  CMP  Glucose 70 - 99 mg/dL 253  664  403   BUN 8 - 23 mg/dL 47  28  57   Creatinine 0.44 - 1.00 mg/dL 4.74  2.59  5.63   Sodium 135 - 145 mmol/L 124  129  129   Potassium 3.5 - 5.1 mmol/L 4.0  3.3  3.7   Chloride 98 - 111 mmol/L 92  94  92   CO2 22 - 32 mmol/L 22  26  24    Calcium 8.9 - 10.3 mg/dL 9.4  9.2  9.8   Total Protein 6.5 - 8.1 g/dL   7.0   Total Bilirubin 0.0 - 1.2 mg/dL   2.4   Alkaline Phos 38 - 126 U/L   155   AST 15 - 41 U/L   23   ALT 0 - 44 U/L   11      Recommendations: Continue to monitor for worsening hepatic encephalopathy, continue lactulose with goal 2-3 semi soft BM daily Continue PPI BID Trend  H/H, recommend keeping Hgb >8 given cardiac concerns Continue diet recommendations givne by SLP Daily MELD labs, MELD 3.0: 33 as of 08/07/23 (please notify if significant increase in MELD) Should continue outpatient follow up with [facility]   Brooke Bonito, MSN, APRN, FNP-BC, AGACNP-BC [facility] [place] [facility] at [facility]

## 2023-08-09 NOTE — Progress Notes (Signed)
Patient ID: [name] [name], female   DOB: 04/01/1951, 73 y.o.   MRN: [id number]  Outpatient HD orders: [facility] MTTF. 3 hours.  TDC.  Flow rates: 400/8.3.  EDW 46 kg   3K, lactate 45.  Heparin: None.  Meds: Mircera 75 mcg every 4 weeks.   Assessment/Plan: ESRD -Will keep on MWF schedule for HD while admitted, tolerated dialysis on Wednesday but worsened 2/even with no ultrafiltration patient complained of chest pressure midsternal with stable vitals but heart rate was 107.  Chest pressure resolved with lowering of the blood flow rate.  She went into A-fib RVR and blood pressure dropped to 99/45 6 minutes later.  Dialysis was stopped at that time.  Planning on a 3-hour treatment Friday, lower blood flows and no ultrafiltration.  Patient still makes a decent amount of urine but does not appear to be floridly volume overloaded.  Did not tolerate IV KCl and gave a single dose of K-Dur 40 milliequivalents Thur she tolerated.  Is on for dialysis today and we are trying to go very slow with low flows and no UF. Secondary Hyperparathyroidism/Hyperphosphatemia - continue with home meds  Loculated right pleural effusion  -per primary -receiving doxycycline  PAF -eliquis on hold given elevated INR, per primary.  A-fib RVR shortly after 2 hours on dialysis Wednesday with hypotension noted as well, started on amiodarone bolus followed by drip.  Patient actually spontaneously converted prior to starting the drip.  Amiodarone was discontinued on  continued on metoprolol for rate control.  DM1 -pump on hold, per primary  Disposition - pt was not able to get back and forth to HD after discharge on 08/01/23.  She has had FTT and has not improved much.  Will likely require SNF placement.  S: Feeling a little better this morning.  Patient went to A-fib RVR with hypotension shortly after the 2-hour mark on dialysis Wednesday; and A-fib RVR this morning and given metoprolol IV.Marland Kitchen  Cough but denies any increased shortness of  breath.  O:BP 101/79 (BP Location: Left Leg)   Pulse (!) 144   Temp 98.8 F (37.1 C) (Oral)   Resp 14   Ht 5\' 2"  (1.575 m)   Wt 49.2 kg   SpO2 100%   BMI 19.83 kg/m   Intake/Output Summary (Last 24 hours) at 08/09/2023 1012 Last data filed at 08/09/2023 0856 Gross per 24 hour  Intake 720 ml  Output 1 ml  Net 719 ml   Intake/Output: I/O last 3 completed shifts: In: 1291 [P.O.:480; I.V.:34.5; Blood:410; IV Piggyback:366.5] Out: 702 [Urine:1; Other:700; Stool:1]  Intake/Output this shift:  Total I/O In: 240 [P.O.:240] Out: -  Weight change: 1.27 kg Gen: NAD CVS: tachy Resp: CTA Abd: +BS, soft, NT/ND Ext: no edema Access: Left arm brachiobasilic fistula not transposed positive bruit but does not augment well, right IJ tunnel catheter  Recent Labs  Lab 08/04/23 1600 08/05/23 0323 08/06/23 0345 08/07/23 0440 08/07/23 2121 08/09/23 0513  NA 128* 130* 128* 129* 129* 124*  K 3.8 4.0 3.8 3.7 3.3* 4.0  CL 93* 95* 91* 92* 94* 92*  CO2 24 21* 19* 24 26 22   GLUCOSE 202* 193* 350* 190* 183* 187*  BUN 85* 91* 33* 57* 28* 47*  CREATININE 2.93* 2.79* 1.38* 2.30* 1.50* 2.33*  ALBUMIN 3.0* 2.8* 3.6 3.4*  3.3*  --  3.0*  CALCIUM 9.6 9.4 9.1 9.8 9.2 9.4  PHOS  --   --  2.6 3.5 2.5 2.8  AST 24 21  --  23  --   --   ALT 16 14  --  11  --   --    Liver Function Tests: Recent Labs  Lab 08/04/23 1600 08/05/23 0323 08/06/23 0345 08/07/23 0440 08/09/23 0513  AST 24 21  --  23  --   ALT 16 14  --  11  --   ALKPHOS 241* 197*  --  155*  --   BILITOT 2.0* 2.2*  --  2.4*  --   PROT 7.0 6.5  --  7.0  --   ALBUMIN 3.0* 2.8* 3.6 3.4*  3.3* 3.0*   No results for input(s): "LIPASE", "AMYLASE" in the last 168 hours. Recent Labs  Lab 08/04/23 2148  AMMONIA 35   CBC: Recent Labs  Lab 08/04/23 1600 08/05/23 0323 08/06/23 1828 08/07/23 0440 08/07/23 2121 08/08/23 0547 08/09/23 0513  WBC 10.6* 12.3* 12.2* 11.3*  --   --  11.2*  NEUTROABS 8.4*  --   --   --   --   --   --    HGB 9.5* 9.4* 7.1* 7.0* 7.0* 8.6* 8.4*  HCT 28.0* 25.8* 21.4* 21.8* 21.1* 25.9* 25.1*  MCV 91.2 92.5 92.6 95.2  --   --  91.6  PLT 186 209 201 215  --   --  183   Cardiac Enzymes: Recent Labs  Lab 08/04/23 2148  CKTOTAL 18*   CBG: Recent Labs  Lab 08/08/23 1122 08/08/23 1530 08/08/23 2151 08/09/23 0145 08/09/23 0817  GLUCAP 291* 216* 219* 224* 162*    Iron Studies:  No results for input(s): "IRON", "TIBC", "TRANSFERRIN", "FERRITIN" in the last 72 hours.  Studies/Results: US Venous Img Upper Uni Left (DVT) Result Date: 08/09/2023 CLINICAL DATA:  Left upper extremity swelling for several days. EXAM: Left UPPER EXTREMITY VENOUS DOPPLER ULTRASOUND TECHNIQUE: Gray-scale sonography with graded compression, as well as color Doppler and duplex ultrasound were performed to evaluate the upper extremity deep venous system from the level of the subclavian vein and including the jugular, axillary, basilic, radial, ulnar and upper cephalic vein. Spectral Doppler was utilized to evaluate flow at rest and with distal augmentation maneuvers. COMPARISON:  None Available. FINDINGS: Contralateral Subclavian Vein: Respiratory phasicity is normal and symmetric with the symptomatic side. No evidence of thrombus. Normal compressibility. Internal Jugular Vein: No evidence of thrombus. Normal compressibility, respiratory phasicity and response to augmentation. Subclavian Vein: No evidence of thrombus. Normal compressibility, respiratory phasicity and response to augmentation. Axillary Vein: No evidence of thrombus. Normal compressibility, respiratory phasicity and response to augmentation. Cephalic Vein: Noncompressible consistent with occlusive thrombus above antecubital fossa. Basilic Vein: No evidence of thrombus. Normal compressibility, respiratory phasicity and response to augmentation. Brachial Veins: No evidence of thrombus. Normal compressibility, respiratory phasicity and response to augmentation. Radial  Veins: No evidence of thrombus. Normal compressibility, respiratory phasicity and response to augmentation. Ulnar Veins: No evidence of thrombus. Normal compressibility, respiratory phasicity and response to augmentation. Venous Reflux:  None visualized. Other Findings:  None visualized. IMPRESSION: No evidence of DVT within the left upper extremity. Superficial thrombophlebitis is noted in the left cephalic vein. Electronically Signed   By: Lupita Raider M.D.   On: 08/09/2023 08:02    aspirin EC  81 mg Oral Q breakfast   Chlorhexidine Gluconate Cloth  6 each Topical Daily   Chlorhexidine Gluconate Cloth  6 each Topical Q0600   doxycycline  100 mg Oral Q12H   feeding supplement (NEPRO CARB STEADY)  237 mL Oral BID BM  gabapentin  100 mg Oral QHS   insulin glargine-yfgn  3 Units Subcutaneous Daily   insulin glulisine  0-5 Units Subcutaneous QHS   insulin glulisine  0-9 Units Subcutaneous TID WC   insulin glulisine  2 Units Subcutaneous TID WC   lactulose  10 g Oral BID   metoprolol tartrate  2.5 mg Intravenous Once   metoprolol tartrate  12.5 mg Oral BID   pantoprazole (PROTONIX) IV  40 mg Intravenous Q12H   pravastatin  20 mg Oral q1800   sodium chloride flush  3 mL Intravenous Q12H    BMET    Component Value Date/Time   NA 124 (L) 08/09/2023 0513   NA 142 04/16/2017 1031   NA 142 12/13/2016 1427   K 4.0 08/09/2023 0513   K 4.4 12/13/2016 1427   CL 92 (L) 08/09/2023 0513   CO2 22 08/09/2023 0513   CO2 28 12/13/2016 1427   GLUCOSE 187 (H) 08/09/2023 0513   GLUCOSE 107 12/13/2016 1427   BUN 47 (H) 08/09/2023 0513   BUN 30 (H) 04/16/2017 1031   BUN 33.3 (H) 12/13/2016 1427   CREATININE 2.33 (H) 08/09/2023 0513   CREATININE 1.63 (H) 11/07/2017 1406   CREATININE 1.3 (H) 12/13/2016 1427   CALCIUM 9.4 08/09/2023 0513   CALCIUM 10.5 (H) 12/31/2018 1306   CALCIUM 10.3 12/13/2016 1427   GFRNONAA 22 (L) 08/09/2023 0513   GFRNONAA 33 (L) 11/07/2017 1406   GFRAA 34 (L) 04/25/2020  1015   GFRAA 38 (L) 11/07/2017 1406   CBC    Component Value Date/Time   WBC 11.2 (H) 08/09/2023 0513   RBC 2.74 (L) 08/09/2023 0513   HGB 8.4 (L) 08/09/2023 0513   HGB 12.4 09/16/2019 1533   HGB 13.4 12/13/2016 1427   HCT 25.1 (L) 08/09/2023 0513   HCT 38.4 09/16/2019 1533   HCT 40.3 12/13/2016 1427   PLT 183 08/09/2023 0513   PLT 120 (L) 09/16/2019 1533   MCV 91.6 08/09/2023 0513   MCV 92 09/16/2019 1533   MCV 88.8 12/13/2016 1427   MCH 30.7 08/09/2023 0513   MCHC 33.5 08/09/2023 0513   RDW 14.7 08/09/2023 0513   RDW 13.2 09/16/2019 1533   RDW 14.6 (H) 12/13/2016 1427   LYMPHSABS 1.0 08/04/2023 1600   LYMPHSABS 1.2 12/13/2016 1427   MONOABS 1.1 (H) 08/04/2023 1600   MONOABS 0.8 12/13/2016 1427   EOSABS 0.0 08/04/2023 1600   EOSABS 0.2 12/13/2016 1427   BASOSABS 0.1 08/04/2023 1600   BASOSABS 0.1 12/13/2016 1427

## 2023-08-09 NOTE — Progress Notes (Signed)
   08/09/23 1530  Vitals  Temp 98.8 F (37.1 C)  Temp Source Oral  BP 95/65  BP Location Right Leg  BP Method Automatic  Pulse Rate 98  ECG Heart Rate 98  Resp 20  Oxygen Therapy  SpO2 100 %  O2 Device Room Air  During Treatment Monitoring  Intra-Hemodialysis Comments Tx completed  Post Treatment  Dialyzer Clearance Lightly streaked  Hemodialysis Intake (mL) 0 mL  Liters Processed 60  Fluid Removed (mL) 0 mL  Tolerated HD Treatment Yes  Post-Hemodialysis Comments Pt had HR 130 at last 5 mins.  Hemodialysis Catheter Right Internal jugular Double lumen Permanent (Tunneled)  Placement Date/Time: 05/01/23 0948   Placed prior to admission: No  Serial / Lot #: [id number]  Expiration Date: 05/23/27  Time Out: Correct patient;Correct site;Correct procedure  Maximum sterile barrier precautions: Hand hygiene;Cap;Mask;Sterile gown...  Site Condition No complications  Blue Lumen Status Heparin locked  Red Lumen Status Heparin locked  Catheter fill solution Heparin 1000 units/ml  Catheter fill volume (Arterial) 1.9 cc  Catheter fill volume (Venous) 1.9  Dressing Type Transparent  Dressing Status Antimicrobial disc in place  Interventions New dressing  Drainage Description None  Dressing Change Due 08/12/23  Post treatment catheter status Capped and Clamped

## 2023-08-09 NOTE — Discharge Summary (Signed)
Physician Discharge Summary   Patient: [name] [name] MRN: [id number] DOB: 10/13/1950  Admit date:     08/04/2023  Discharge date: 08/09/23  Discharge Physician: Vassie Loll   PCP: Aliene Beams, MD   Recommendations at discharge:  Repeat CBC to follow hemoglobin trend/stability Make sure patient follow-up with cardiology service and gastroenterology as instructed. Reassess blood pressure and adjust antihypertensive treatment as required. Goals of care discussion and advance care planning recommended. Repeat CT chest in about 4 weeks to assess resolution of infiltrates and reabsorption of pleural effusion.   Discharge Diagnoses: Principal Problem:   Lethargy Active Problems:   Hypertension   Duodenal ulcer   Pacemaker   Coronary artery disease involving native coronary artery of native heart with unstable angina pectoris (HCC)   PAF (paroxysmal atrial fibrillation) (HCC)   Insulin dependent type 1 diabetes mellitus (HCC)   Multiple drug allergies   Liver cirrhosis secondary to NASH (HCC)   ESRD on hemodialysis (HCC)   FTT (failure to thrive) in adult  Brief Hospital admission ED course: As per HPI written by Dr. Allena Katz on 08/04/2023 [name] [name] is a 73 y.o. 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 Pt has H/O is FTT. Pt was just d/c Wednesday. And since then she has been very weak. Pt lives with spouse, but he is not able to lift her and move her and take her to dialysis.  Since coming home patient has been lethargic and weak and decreased p.o. intake insulin has been held  due to that. Spouse states she has had left ear pain and has had bruising from the face mask last time she had when admitted.   >>ED Course: In emergency room patient is lethargic and engaging very tired and ill-appearing vitals are as below and stable patient's oxygenation is improving and has been started on oxygen. Multiple Vitals        Vitals:    08/04/23 2100 08/04/23 2130 08/04/23 2200 08/04/23 2245  BP: (!) 126/55 (!) 154/60 (!) 118/98 (!) 119/48  Pulse: (!) 115 (!) 118 (!) 117 (!) 106  Temp:     98.4 F (36.9 C)    Resp: 14 20 19 20   Height:          Weight:          SpO2: 97% 96% 91% 94%  TempSrc:          BMI (Calculated):            EKG shows sinus tachycardia at 103 left atrial enlargement, left anterior fascicular block LVH, prominent T waves in the lateral leads, QTc 481. Evaluation so far shows: Venous blood gas done in the ED showing pO2 of 50 and pCO2 of 41. Metabolic panel shows sodium 128 normal potassium glucose 2 2 creatinine 2.93 anion gap of 11 alk phos 241 total bili of 2.0.  Lactic acid of 0.9 CBC shows white count of 10.6 hemoglobin of 9.5 platelets of 186. PT 39.1 INR 4.0. Blood cultures collected in the ED. CT chest done today again shows  moderate size loculated pleural effusion improving pneumonia, persistent density at the right lung base, stable area of nodular thickening in the right lung apex please refer to complete report. 2D echo-July 30, 2023: 2D echo showing estimated ejection fraction of 70 to 75% left ventricular has hyperdynamic function, right ventricular pressure and volume overload, reduced systolic function the right ventricle, bioprosthetic aortic valve.  Assessment and Plan: 1)acute metabolic encephalopathy and generalized weakness and deconditioning-- -multifactorial in the setting of influenza infection, NASH liver cirrhosis,  ESRD with missed hemodialysis sessions and OSA with chronic hypercapnia with intolerance of BiPAP/NIV ---  Use BiPAP/NIV as tolerated -Nephrology consult for hemodialysis -Supportive care for influenza--patient did not tolerate Tamiflu 08/06/23 -- Mentation appears to be back to baseline according to patient's husband who is at bedside. -Expressing to be weak and tired.   2)ESRD----M/Tu/Th/F schedule----as outpatient -last HD prior to this admission 07/31/2023 -Patient usually gets hemodialysis at least 4 times a week as outpt -Nephrology consult requested for HD--nephrologist recommends MWF schedule while inpatient -Had hemodialysis via 08/07/2023 -Appreciate nephrology service assistance and recommendations; last hemodialysis prior to discharge 08/09/2023.   3)Liver cirrhosis secondary to NASH (HCC) -Serum ammonia is not elevated -INR is 4.0 watch closely -INR down to 2.7 recently; continue to follow trend and follow up as an outpatient with GI service.   4)Acute on chronic chronic anemia of ESRD/?? ABLA superimposed on chronic anemia of ESRD =-Hgb  11.4 >> 9.4  -Serum iron and ferritin are not low On 08/06/23 she had Had dark stool with some burgundy outline---Hemoccult blood is positive ----Holding Eliquis for now---Last Dose of Eliquis 08/04/23 at 0900 am -on 07/31/23 she was seen by GI at [facility] (Dr. Lavon Paganini) and at the moment no recommendations for inpatient endoscopic evaluation provided. -Gi input/consult at [facility] requested; appreciate assistance and recommendation.  Patient to follow-up with GI as an outpatient. -Defer decision on ESA/Procrit to nephrology team -Continue to hold blood thinners -In the setting of chest discomfort for and transient A-fib with RVR patient received 1 unit of PRBCs overnight and hemoglobin is 8.6 currently and no signs of overt bleeding appreciated.   5)PAFib/s/p PPM--Eliquis on hold due to bleeding concerns in the setting of liver cirrhosis with elevated INR--please see #4 above -Last Dose of Eliquis 08/04/23 at 0900 am -- Continue metoprolol for rate  control -Planning to resume the use of Eliquis on 08/16/2023   6)Chronic diastolic dysfunction CHF/HFpEF- --continue treatment with adjusted dose of metoprolol. -Use hemodialysis sessions to address volume status -Next HD 08/07/2023   7)CAD history of CABG, multiple PCI / HLD:  -Continue pravastatin and aspirin  --Eliquis on hold as above #5 (Last Dose of Eliquis 08/04/23 at 0900 am) ---- Patient will continue receiving outpatient Repatha and Vascepa   8)DM2-A1c 6.7 reflecting good diabetic control PTA -Resume adjusted dose of prior to admission insulin pump -Continue to follow CBGs fluctuation.   9)Generalized Weakness and Deconditioning/FTT--- physical therapy eval appreciated recommends SNF rehab -Patient and family decided to go home with home health services instead.   10) moderate protein calorie malnutrition -Associated with chronic medical problems -Nepro/feeding supplements recommended.   11) left upper extremity swelling -Continue keeping limb elevated as much as possible. -Lower extremity Dopplers demonstrating no presence of blood clots.   12) hypokalemia/hypomagnesemia -Magnesium has been repleted and is stable. -Potassium readdressed with dialysis on 08/09/2023 prior to discharge.  13) loculated pleural effusion/pneumonia/bronchitis -Complete treatment with doxycycline as recommended -Continue good protein intake and intermittent use of albumin as  needed for volume management -Repeat images as an outpatient to further decide the need of thoracentesis.  Consultants: Nephrology service, gastroenterology service. Procedures performed: See below for x-ray reports. Disposition: Home with home health services. Diet recommendation: Renal diet and modified carbohydrates.  DISCHARGE MEDICATION: Allergies as of 08/09/2023       Reactions   Avandia [rosiglitazone Maleate] Other (See Comments)   Congestive heart failure   Cephalexin Anaphylaxis, Swelling, Rash    Extremities swell , throat swelling   Clindamycin Anaphylaxis, Shortness Of Breath   esophageal spasms   Humalog [insulin Lispro] Itching    big knots and whelp   Lincomycin Anaphylaxis, Shortness Of Breath   Metformin Other (See Comments)   Congestive heart failure   Novolog [insulin Aspart (human Analog)] Hives, Itching   Humalog & Novolog big knots and whelp   Penicillins Anaphylaxis, Hives, Swelling, Rash   Swelling of the face/tongue/throat, SOB, or low BP Childhood allergy   Tizanidine Other (See Comments)   Dizziness, Mental Status Changes, Hallucination   Adhesive [tape] Other (See Comments)   Tears skin, Please use "paper" tape   Atorvastatin Other (See Comments)   increased LFT's, no muscle weakness, leg pain   Cholestyramine Other (See Comments)   Liver Disorder Elevated LFTs   Gentamycin [gentamicin] Hives, Itching, Rash   Fine red bumps.  Reaction noted post loop recorder implant, where gentamycin was used for irrigation. Topical prep   Iodinated Contrast Media Other (See Comments)   PAIN DURING LYMPHANGIOGRAM '81, NO ALLERGY TO IV CONTRAST.  Has had procedures with Iodine since without problems.   Limonene Hives, Itching, Rash, Other (See Comments)   Reaction noted post loop recorder implant, where gentamycin was used for irrigation. Topical prep   Prednisone Itching, Other (See Comments)   B/P went high, itching all over.  Tolerates with benadryl    Ranexa [ranolazine] Other (See Comments)   Nausea, dizziness, low blood sugar, tingling in right hand, "blood blisters" on tongue    Rosuvastatin Nausea And Vomiting, Nausea Only, Other (See Comments)   Muscle and joint pain & increased LFTs; tolerates Pravastatin 10mg  (max)   Simvastatin Other (See Comments)   Increased LFT's   Zinc    Unknown   Acetaminophen    Pt told not to take tylenol due to liver issues   Azelastine Other (See Comments)   Metallic taste,spitting up blood   Clopidogrel Other (See Comments)    Does not work for patient Medicine was not effective   [place] [finerenone] Hives, Nausea Only   Lisinopril Diarrhea, Nausea Only, Other (See Comments)   Neosporin [bacitracin-polymyxin B] Dermatitis   Codeine Nausea And Vomiting   Erythromycin Nausea And Vomiting   Hydrocodone-acetaminophen Itching, Rash, Other (See Comments)   itching   Latex Itching, Other (See Comments)   I"if wear gloves hands get red ,itchy no problem if other wear and touch me"   Neomycin Rash, Other (See Comments)   Redness   Nickel Itching   Ranitidine Nausea Only   Tamiflu [oseltamivir] Nausea And Vomiting   Tramadol Nausea Only        Medication List     PAUSE taking these medications    apixaban 5 MG Tabs tablet Wait to take this until: August 16, 2023 Commonly known as: ELIQUIS Take 1 tablet (5 mg total) by mouth 2 (two) times daily.       STOP taking these medications    metoprolol succinate 25 MG 24 hr tablet Commonly known as: Toprol  XL   torsemide 20 MG tablet Commonly known as: DEMADEX       TAKE these medications    ALPRAZolam 0.5 MG tablet Commonly known as: XANAX Take 1 tablet (0.5 mg total) by mouth 2 (two) times daily as needed for anxiety. What changed: when to take this   Apidra 100 UNIT/ML injection Generic drug: insulin glulisine Inject 40 Units into the skin as directed. INFUSE THROUGH INSULIN PUMP UTD, Bolus depends on carb intake   BD Pen Needle Nano U/F 32G X 4 MM Misc Generic drug: Insulin Pen Needle Use as directed with insulin pens   benzonatate 200 MG capsule Commonly known as: TESSALON Take 1 capsule (200 mg total) by mouth 3 (three) times daily.   Co Q 10 100 MG Caps Take 100 mg by mouth at bedtime.   Delsym 30 MG/5ML liquid Generic drug: dextromethorphan Take 5 mLs (30 mg total) by mouth at bedtime.   denosumab 60 MG/ML Sosy injection Commonly known as: PROLIA Inject 60 mg into the skin every 6 (six) months.   doxycycline 100 MG  tablet Commonly known as: VIBRA-TABS Take 1 tablet (100 mg total) by mouth every 12 (twelve) hours for 3 days.   escitalopram 5 MG tablet Commonly known as: LEXAPRO Take 5 mg by mouth at bedtime.   gabapentin 100 MG capsule Commonly known as: NEURONTIN Take 100 mg by mouth daily.   Glucagon Emergency 1 MG Kit Inject 1 mg into the vein once as needed (low blood sugar).   insulin glargine 100 UNIT/ML Solostar Pen Commonly known as: LANTUS Inject 4 Units into the skin daily. What changed: how much to take   lactulose 10 GM/15ML solution Commonly known as: CHRONULAC Take 15 mLs (10 g total) by mouth 2 (two) times daily.   latanoprost 0.005 % ophthalmic solution Commonly known as: XALATAN Place 1 drop into both eyes at bedtime.   metoprolol tartrate 25 MG tablet Commonly known as: LOPRESSOR Take 0.5 tablets (12.5 mg total) by mouth 2 (two) times daily.   midodrine 5 MG tablet Commonly known as: PROAMATINE Take 1 tablet (5 mg total) by mouth daily as needed (Prior to HD).   mometasone 50 MCG/ACT nasal spray Commonly known as: NASONEX Place 1 spray into the nose daily as needed (allergies).   nitroGLYCERIN 0.4 MG/SPRAY spray Commonly known as: NITROLINGUAL PLACE 1 SPRAY UNDER TH TONGUE EVERY 5 MINUTES FOR 3 DOSES AS NEEDED FOR CHEST PAIN   pantoprazole 40 MG tablet Commonly known as: Protonix Take 1 tablet (40 mg total) by mouth 2 (two) times daily.   polyethylene glycol 17 g packet Commonly known as: MIRALAX / GLYCOLAX Take 17 g by mouth daily as needed for mild constipation.   Repatha SureClick 140 MG/ML Soaj Generic drug: Evolocumab INJECT 140MG  INTO THE SKIN EVERY 14 DAYS   Vascepa 1 g capsule Generic drug: icosapent Ethyl TAKE TWO CAPSULES (2 GRAMS TOTAL) BY MOUTH TWO TIMES DAILY        Follow-up Information     [facility], [facility] Follow up.   Specialty: Home Health Services Why: [facility] staff will call you to schedule in home PT/OT  visits Contact information: [address] [place] [address] [phone]         Aliene Beams, MD. Schedule an appointment as soon as possible for a visit in 10 day(s).   Specialty: Family Medicine Contact information: [phone] [address] [place] [address] [phone]  Discharge Exam: Filed Weights   08/07/23 1650 08/09/23 0546 08/09/23 1530  Weight: 47.9 kg 49.2 kg 49.2 kg   General exam: Alert, awake, oriented x 3; expressed feeling better today and denying shortness of breath, chest pain or overt bleeding.  Chronically ill and deconditioned. Respiratory system: No using accessory muscles; no frank crackles or wheezing. Cardiovascular system: Rate controlled and currently sinus; no rubs, no gallops.  Positive murmur. Gastrointestinal system: Abdomen is nondistended, soft and nontender. No organomegaly or masses felt. Normal bowel sounds heard. Central nervous system: Moving 4 limbs spontaneously; generally weak.  No focal neurological deficits. Extremities: No cyanosis or clubbing; left upper extremity swelling appreciated. Skin: No petechiae; multiple bruises appreciated (including left facial bruise following recent fall.  Right IJ hemodialysis catheter in place and in situ pacemaker. Psychiatry: Judgement and insight appear normal.  Flat affect appreciated.  Condition at discharge: Stable and improved.  The results of significant diagnostics from this hospitalization (including imaging, microbiology, ancillary and laboratory) are listed below for reference.   Imaging Studies: US Venous Img Upper Uni Left (DVT) Result Date: 08/09/2023 CLINICAL DATA:  Left upper extremity swelling for several days. EXAM: Left UPPER EXTREMITY VENOUS DOPPLER ULTRASOUND TECHNIQUE: Gray-scale sonography with graded compression, as well as color Doppler and duplex ultrasound were performed to evaluate the upper extremity deep venous system  from the level of the subclavian vein and including the jugular, axillary, basilic, radial, ulnar and upper cephalic vein. Spectral Doppler was utilized to evaluate flow at rest and with distal augmentation maneuvers. COMPARISON:  None Available. FINDINGS: Contralateral Subclavian Vein: Respiratory phasicity is normal and symmetric with the symptomatic side. No evidence of thrombus. Normal compressibility. Internal Jugular Vein: No evidence of thrombus. Normal compressibility, respiratory phasicity and response to augmentation. Subclavian Vein: No evidence of thrombus. Normal compressibility, respiratory phasicity and response to augmentation. Axillary Vein: No evidence of thrombus. Normal compressibility, respiratory phasicity and response to augmentation. Cephalic Vein: Noncompressible consistent with occlusive thrombus above antecubital fossa. Basilic Vein: No evidence of thrombus. Normal compressibility, respiratory phasicity and response to augmentation. Brachial Veins: No evidence of thrombus. Normal compressibility, respiratory phasicity and response to augmentation. Radial Veins: No evidence of thrombus. Normal compressibility, respiratory phasicity and response to augmentation. Ulnar Veins: No evidence of thrombus. Normal compressibility, respiratory phasicity and response to augmentation. Venous Reflux:  None visualized. Other Findings:  None visualized. IMPRESSION: No evidence of DVT within the left upper extremity. Superficial thrombophlebitis is noted in the left cephalic vein. Electronically Signed   By: Lupita Raider M.D.   On: 08/09/2023 08:02   CT Chest Wo Contrast Result Date: 08/04/2023 CLINICAL DATA:  Pneumonia, complication suspected, xray done Failure to thrive. EXAM: CT CHEST WITHOUT CONTRAST TECHNIQUE: Multidetector CT imaging of the chest was performed following the standard protocol without IV contrast. RADIATION DOSE REDUCTION: This exam was performed according to the departmental  dose-optimization program which includes automated exposure control, adjustment of the mA and/or kV according to patient size and/or use of iterative reconstruction technique. COMPARISON:  Radiograph earlier today. CT 5 days ago 07/30/2023. Chest CT 08/10/2022 also reviewed FINDINGS: Cardiovascular: Dialysis catheter tip in the right atrium. The heart is normal in size. Dense calcification of native coronary arteries. Prosthetic aortic valve. Dense mitral annulus calcifications. Densely calcified thoracic aorta without aneurysm. Left-sided pacemaker with lead tips in the right atrium and ventricle. No significant pericardial effusion. Mediastinum/Nodes: Technically limited assessment in the absence of IV contrast and paucity of mediastinal fat. There is scattered small  mediastinal lymph nodes, none of which are enlarged by size criteria. Hilar assessment is limited on this unenhanced exam. The esophagus is decompressed. Lungs/Pleura: Loculated right pleural effusion with fluid tracking into the minor fissure as well as anterior laterally, without significant interval change. There is also a small medial component at the right lung base associated compressive atelectasis throughout the right lung. Nodular density at the right lung apex measures 13 mm, without significant interval change from exam last year, likely scarring. Additional left apical pleuroparenchymal scarring is similar. There is overall improvement in the multifocal ground-glass opacities in the left lung consistent with resolving pneumonia. Mild residual patchy and nodular areas of ground-glass persist. Greatest residual is in the left lower lobe measuring 11 mm series 3, image 71. In area of ill-defined increased density within the basilar right lower lobe is unchanged over the last 5 days. Right lower lobe mucoid impaction within many segmental bronchi. Upper Abdomen: Gallstones. Posterior left renal cyst is unchanged from 2023. No further follow-up  imaging is recommended. Musculoskeletal: Stable over the last 5 days. Minor T7 superior endplate compression deformity. Median sternotomy. IMPRESSION: 1. Moderate-sized loculated pleural effusion is unchanged over the last 5 days. There is adjacent compressive atelectasis throughout the right lung. 2. Improvement in patchy areas of ground-glass likely resolving pneumonia. Areas of ground-glass persist, recommend CT follow-up to resolution after course of treatment. CT in 3 months would be reasonable. 3. Persistent area of increased density at the right lung base. This may represent an area of chronic atelectasis/aspiration, however is mention previously, difficult to exclude neoplasm. Consider pulmonary follow-up and outpatient PET CT as clinically indicated. 4. Stable area of nodular thickening at the right lung apex, favored to be scarring. 5. Additional stable chronic findings as described. Aortic Atherosclerosis (ICD10-I70.0). Electronically Signed   By: Narda Rutherford M.D.   On: 08/04/2023 18:40   DG Chest Port 1 View Result Date: 08/04/2023 CLINICAL DATA:  Questionable sepsis - evaluate for abnormality Failure to thrive. EXAM: PORTABLE CHEST 1 VIEW COMPARISON:  Radiograph 07/29/2023, CT 07/30/2023 FINDINGS: Right-sided dialysis catheter remains in place. Left-sided pacemaker in place. Prior median sternotomy with prosthetic aortic valve. Loculated right pleural effusion with questionable increase from prior exam. Associated opacity in the right hemithorax is without significant interval change. Areas of ground-glass consolidation in the left lung on prior CT are not well demonstrated by radiograph. No pneumothorax. No convincing pulmonary edema. IMPRESSION: 1. Stable radiographic appearance of the chest. 2. Loculated right pleural effusion and opacities in the right hemithorax are unchanged over the last 6 days. 3. Additional patchy airspace disease on prior CT have no definite radiographic correlate.  Electronically Signed   By: Narda Rutherford M.D.   On: 08/04/2023 16:06   ECHOCARDIOGRAM LIMITED Result Date: 07/30/2023    ECHOCARDIOGRAM LIMITED REPORT   Patient Name:   AYRAM BELUE Date of Exam: 07/30/2023 Medical Rec #:  [id number]         Height:       62.0 in Accession #:    [id number]        Weight:       122.4 lb Date of Birth:  November 12, 1950         BSA:          1.551 m Patient Age:    72 years          BP:           96/47 mmHg Patient Gender: F  HR:           70 bpm. Exam Location:  Inpatient Procedure: Limited Echo, Cardiac Doppler and Color Doppler Indications:    Endocarditis  History:        Patient has prior history of Echocardiogram examinations, most                 recent 07/12/2023. Risk Factors:Hypertension and Dyslipidemia.                 Aortic Valve: Edwards valve is present in the aortic position.  Sonographer:    Melton Krebs RDCS, FE, PE Referring Phys: [id number] Vinnie Level SMITH IMPRESSIONS  1. Left ventricular ejection fraction, by estimation, is 70 to 75%. The left ventricle has hyperdynamic function. There is the interventricular septum is flattened in systole and diastole, consistent with right ventricular pressure and volume overload.  2. Right ventricular systolic function is mildly reduced. The right ventricular size is mildly enlarged.  3. The mitral valve is degenerative. Mild to moderate mitral valve regurgitation. Severe mitral annular calcification.  4. Tricuspid valve regurgitation is moderate.  5. Bioprosthetic aortic valve. No doppler assessment performed. The aortic valve has been repaired/replaced. Aortic valve regurgitation is not visualized. There is a Edwards valve present in the aortic position. Conclusion(s)/Recommendation(s): No evidence of valvular vegetations on this transthoracic echocardiogram. Consider a transesophageal echocardiogram to exclude infective endocarditis if clinically indicated. FINDINGS  Left Ventricle: Left ventricular ejection  fraction, by estimation, is 70 to 75%. The left ventricle has hyperdynamic function. The interventricular septum is flattened in systole and diastole, consistent with right ventricular pressure and volume overload. Right Ventricle: The right ventricular size is mildly enlarged. No increase in right ventricular wall thickness. Right ventricular systolic function is mildly reduced. Pericardium: There is no evidence of pericardial effusion. Mitral Valve: The mitral valve is degenerative in appearance. Severe mitral annular calcification. Mild to moderate mitral valve regurgitation. MV peak gradient, 8.3 mmHg. Tricuspid Valve: The tricuspid valve is grossly normal. Tricuspid valve regurgitation is moderate . No evidence of tricuspid stenosis. Aortic Valve: Bioprosthetic aortic valve. No doppler assessment performed. The aortic valve has been repaired/replaced. Aortic valve regurgitation is not visualized. There is a Edwards valve present in the aortic position. Pulmonic Valve: The pulmonic valve was grossly normal. Pulmonic valve regurgitation is mild. No evidence of pulmonic stenosis. Venous: The inferior vena cava was not well visualized. Additional Comments: A device lead is visualized in the right atrium and right ventricle.  LEFT VENTRICLE PLAX 2D LVIDd:         3.30 cm LVIDs:         1.90 cm LV PW:         1.00 cm LV IVS:        0.90 cm  MITRAL VALVE MV Peak grad: 8.3 mmHg MV Vmax:      1.44 m/s MV Vmean:     80.8 cm/s Lennie Odor MD Electronically signed by Lennie Odor MD Signature Date/Time: 07/30/2023/5:25:40 PM    Final    CT CHEST WO CONTRAST Result Date: 07/30/2023 CLINICAL DATA:  Pneumonia, complications suspected. EXAM: CT CHEST WITHOUT CONTRAST TECHNIQUE: Multidetector CT imaging of the chest was performed following the standard protocol without IV contrast. RADIATION DOSE REDUCTION: This exam was performed according to the departmental dose-optimization program which includes automated exposure  control, adjustment of the mA and/or kV according to patient size and/or use of iterative reconstruction technique. COMPARISON:  Chest radiograph dated 07/29/2023 and CT dated 08/10/2022. FINDINGS:  Evaluation of this exam is limited in the absence of intravenous contrast. Cardiovascular: There is no cardiomegaly. No significant pericardial effusion. Left pectoral pacing device. There is advanced 3 vessel coronary vascular calcification and calcification of the mitral annulus. There is postsurgical changes of CABG. Advanced atherosclerotic calcification of the thoracic aorta. No aneurysmal dilatation. The central pulmonary arteries are grossly unremarkable on this noncontrast CT. Mediastinum/Nodes: No obvious hilar or mediastinal adenopathy. Evaluation however is limited in the absence of intravenous contrast and due to consolidative changes of the right lung. The esophagus is grossly unremarkable. No mediastinal fluid collection. Right IJ catheter with tip in the right atrium. Lungs/Pleura: There is a somewhat loculated appearing moderate size right pleural effusion tracking into the fissures. There is compressive atelectasis of portions of the right upper, right lower and right middle lobes. Areas of airspace density at the right lung base and in the right upper lobe as well as several scattered clusters of ground-glass nodularity in the left lung concerning for pneumonia. There is slight thickening of the right lung pleural surface. And ill-defined area of increased density in the right lower lobe measures approximately 2.3 x 2.1 cm (95/3) and may represent pneumonia or proteinaceous content within the pleural effusion. However, underlying mass is not excluded. Additionally there is a 1.7 x 0.9 cm nodular density in the right apex which may represent scarring or a pulmonary nodule. There is no pneumothorax. The central airways are patent. Upper Abdomen: No acute abnormality. Musculoskeletal: Median sternotomy  wires. No acute osseous pathology. IMPRESSION: 1. Moderate size loculated appearing right pleural effusion with partial compressive atelectasis of the right lung. 2. Findings likely represent multilobar pneumonia. Clinical correlation and follow-up to resolution recommended. 3. Ill-defined density at the right lung base may represent proteinaceous content or infiltrate. A mass/neoplasm is not excluded. Thoracentesis may provide additional diagnostic information if clinically indicated. 4. Focal area of scarring versus nodule in the right apex. Attention on follow-up imaging recommended. 5.  Aortic Atherosclerosis (ICD10-I70.0). Electronically Signed   By: Elgie Collard M.D.   On: 07/30/2023 09:31   DG CHEST PORT 1 VIEW Result Date: 07/29/2023 CLINICAL DATA:  Follow-up pleural effusion EXAM: PORTABLE CHEST 1 VIEW COMPARISON:  07/27/2023 FINDINGS: Cardiac shadow is stable. Postsurgical changes are again seen. Pacing device and dialysis catheter are again noted. Right-sided pleural effusion is again seen slightly less than that noted on the prior exam. This may be related to the upright positioning. Left lung is clear. IMPRESSION: Right-sided pleural effusion slightly less than that seen on the prior exam although this may be positionally related. Electronically Signed   By: Alcide Clever M.D.   On: 07/29/2023 18:06   US THORACENTESIS ASP PLEURAL SPACE W/IMG GUIDE Result Date: 07/27/2023 INDICATION: 74 year old female with cough, shortness of breath, loculated right pleural effusion. EXAM: ULTRASOUND GUIDED RIGHT THORACENTESIS MEDICATIONS: 10 mL 1% lidocaine COMPLICATIONS: None immediate. PROCEDURE: An ultrasound guided thoracentesis was thoroughly discussed with the patient and questions answered. The benefits, risks, alternatives and complications were also discussed. The patient understands and wishes to proceed with the procedure. Written consent was obtained. Ultrasound was performed to localize and mark an  adequate pocket of fluid in the right chest. The area was then prepped and draped in the normal sterile fashion. 1% Lidocaine was used for local anesthesia. Under ultrasound guidance a 6 Fr Safe-T-Centesis catheter was introduced. Thoracentesis was performed. The catheter was removed and a dressing applied. FINDINGS: A total of approximately 400 mL of amber  fluid was removed. Samples were sent to the laboratory as requested by the clinical team. IMPRESSION: Successful ultrasound guided right thoracentesis yielding 400 mL of pleural fluid. Performed by: Loyce Dys PA-C Electronically Signed   By: Marliss Coots M.D.   On: 07/27/2023 16:13   DG Chest 1 View Result Date: 07/27/2023 CLINICAL DATA:  Pleural effusion EXAM: CHEST  1 VIEW COMPARISON:  X-ray 07/27/2023 earlier FINDINGS: Decreasing right pleural effusion with significant residual post thoracentesis. No pneumothorax. Persistent patchy right lung opacities. Stable enlarged cardiopericardial silhouette with sternal wires. Right IJ double-lumen catheter in place once again with left upper chest pacemaker. Vascular congestion interstitial changes. No left-sided pleural effusion. IMPRESSION: Decreasing right pleural effusion with significant residual. No pneumothorax post thoracentesis. Electronically Signed   By: Karen Kays M.D.   On: 07/27/2023 12:30   DG Chest Port 1V same Day Result Date: 07/27/2023 CLINICAL DATA:  Shortness of breath. EXAM: PORTABLE CHEST 1 VIEW COMPARISON:  Chest radiograph dated 07/26/2023. FINDINGS: The heart size and mediastinal contours are within normal limits. Vascular calcifications are seen in the aortic arch. There is a moderate to large right pleural effusion with associated atelectasis/airspace disease. There is mild bilateral interstitial opacities. No pneumothorax. A right internal jugular central venous catheter tip overlies the right atrium. A left subclavian approach cardiac device is redemonstrated. IMPRESSION: 1.  Moderate to large right pleural effusion with associated atelectasis/airspace disease. 2. Mild bilateral interstitial opacities may reflect pulmonary edema. Electronically Signed   By: Romona Curls M.D.   On: 07/27/2023 11:09   DG CHEST PORT 1 VIEW Result Date: 07/26/2023 CLINICAL DATA:  73 year old female with history of hypoxia and hemoptysis. EXAM: PORTABLE CHEST 1 VIEW COMPARISON:  Chest x-ray 07/24/2023. FINDINGS: Right internal jugular PermCath with tip projecting over the superior aspect of the right atrium. Left-sided pacemaker device in place with lead tips projecting over the right atrium and right ventricular apex. Status post median sternotomy for CABG and aortic valve replacement (a stented bioprosthesis is noted). Lung volumes are low. Irregular opacities throughout the periphery of the right mid to lower hemithorax most compatible with a partially loculated right-sided pleural effusion, similar to the recent prior study. Ill-defined opacities throughout the right lung, particularly in the right mid to lower lung, which likely reflect areas of underlying atelectasis and/or consolidation. Left lung appears clear. No left pleural effusion. No pneumothorax. No evidence of pulmonary edema. Heart size is upper limits of normal. Atherosclerotic calcifications in the thoracic aorta. IMPRESSION: 1. Moderate partially loculated right-sided pleural effusion with extensive areas of atelectasis and/or consolidation throughout the right mid to lower lung, similar to the recent prior study. 2. Aortic atherosclerosis. 3. Postoperative changes and support apparatus, as above. Electronically Signed   By: Trudie Reed M.D.   On: 07/26/2023 05:36   CT HEAD WO CONTRAST ( ) Result Date: 07/25/2023 CLINICAL DATA:  AMS, dysarthria. Hx fall on AC. r/o ICH EXAM: CT HEAD WITHOUT CONTRAST TECHNIQUE: Contiguous axial images were obtained from the base of the skull through the vertex without intravenous contrast.  RADIATION DOSE REDUCTION: This exam was performed according to the departmental dose-optimization program which includes automated exposure control, adjustment of the mA and/or kV according to patient size and/or use of iterative reconstruction technique. COMPARISON:  CT head November 15, 2016. FINDINGS: Brain: No evidence of acute infarction, hemorrhage, hydrocephalus, extra-axial collection or mass lesion/mass effect. Vascular: No hyperdense vessel identified. Skull: No acute fracture. Sinuses/Orbits: Clear sinuses.  No acute orbital findings. Other: No mastoid  effusions. IMPRESSION: No evidence of acute intracranial abnormality. Electronically Signed   By: Feliberto Harts M.D.   On: 07/25/2023 03:43   DG Chest Portable 1 View Result Date: 07/24/2023 CLINICAL DATA:  new cough EXAM: PORTABLE CHEST 1 VIEW COMPARISON:  Chest x-ray 05/01/2023 FINDINGS: Left chest wall dual lead pacemaker. Right chest wall dialysis catheter with tip overlying the right atrium. The heart and mediastinal contours are unchanged. Atherosclerotic plaque. Coronary artery stents. Interval development of likely loculated moderate volume right pleural effusion. Diffuse hazy airspace opacity of the right lung. No pulmonary edema. No left pleural effusion. Left lung grossly unremarkable. No pneumothorax. No acute osseous abnormality. IMPRESSION: 1. Interval development of likely loculated moderate volume right pleural effusion. Diffuse hazy airspace opacity of the right lung may represent infection/inflammation versus atelectasis. 2.  Aortic Atherosclerosis (ICD10-I70.0). Electronically Signed   By: Tish Frederickson M.D.   On: 07/24/2023 22:30   VAS US DUPLEX DIALYSIS ACCESS (AVF, AVG) Result Date: 07/23/2023 DIALYSIS ACCESS Patient Name:  ELIANYS GREULICH  Date of Exam:   07/22/2023 Medical Rec #: [id number]          Accession #:    [id number] Date of Birth: [birth year]          Patient Gender: F Patient Age:   33 years Exam Location:  [facility] Procedure:      VAS US DUPLEX DIALYSIS ACCESS (AVF, AVG) Referring Phys: Coral Else --------------------------------------------------------------------------------  Reason for Exam: Routine follow up. Access Site: Right Upper Extremity. Access Type: Brachial-Basilic. History: CKD, HCC. Performing Technologist: Criss Rosales  Examination Guidelines: A complete evaluation includes B-mode imaging, spectral Doppler, color Doppler, and power Doppler as needed of all accessible portions of each vessel. Unilateral testing is considered an integral part of a complete examination. Limited examinations for reoccurring indications may be performed as noted.  Findings: Right Upper AVF +--------------------+----------+-----------------+--------+ AVF                 PSV (cm/s)Flow Vol (mL/min)Comments +--------------------+----------+-----------------+--------+ Native artery inflow   255           347                +--------------------+----------+-----------------+--------+ AVF Anastomosis        493                     0.19 cm  +--------------------+----------+-----------------+--------+  +------------+----------+-------------+----------+--------+ OUTFLOW VEINPSV (cm/s)Diameter (cm)Depth (cm)Describe +------------+----------+-------------+----------+--------+ Shoulder        81        0.70        0.83            +------------+----------+-------------+----------+--------+ Prox UA         79        0.66        0.94            +------------+----------+-------------+----------+--------+ Mid UA          83        0.67        0.77            +------------+----------+-------------+----------+--------+ Dist UA        446        0.71        0.61            +------------+----------+-------------+----------+--------+   Summary: - Patent right upper extremity arteriovenous fistula. - Velocities less than 100 cm/s noted - Volume Flow: 347 mL/min.  *See table(s)  above for  measurements and observations.  Diagnosing physician: Carolynn Sayers Electronically signed by Carolynn Sayers on 07/23/2023 at 8:39:55 AM.   --------------------------------------------------------------------------------   Final    ECHOCARDIOGRAM COMPLETE Result Date: 07/12/2023    ECHOCARDIOGRAM REPORT   Patient Name:   TEIGHAN PUZA Date of Exam: 07/12/2023 Medical Rec #:  [id number]         Height:       62.0 in Accession #:    [id number]        Weight:       116.4 lb Date of Birth:  10/19/1950         BSA:          1.519 m Patient Age:    72 years          BP:           115/64 mmHg Patient Gender: F                 HR:           82 bpm. Exam Location:  Outpatient Procedure: Color Doppler and Cardiac Doppler Indications:    I50.9 CHF  History:        Patient has prior history of Echocardiogram examinations. CAD,                 Pacemaker and 19mm Edwards AVR, CKD on Dialysis,                 Arrythmias:PAFIB; Risk Factors:Breast CA and Dyslipidemia.  Sonographer:    L. 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 Global longitudinal strain was attempted. IMPRESSIONS  1. Left ventricular ejection fraction, by estimation, is 60 to 65%. The left ventricle has normal function. The left ventricle has no regional wall motion abnormalities. Left ventricular diastolic parameters are indeterminate.  2. Right ventricular systolic function is normal. The right ventricular size is normal. There is severely elevated pulmonary artery systolic pressure.  3. Left atrial size was moderately dilated.  4. Severe MAC mean gradient 13 peak 29 mmHg at HR of 92 bpm. The mitral valve is abnormal. Mild to moderate mitral valve regurgitation. Moderate mitral stenosis. Severe mitral annular calcification.  5. Cannot tell if PPM wire contributes to TR Device leads in RA/RV . The tricuspid valve is abnormal. Tricuspid valve regurgitation is severe.   6. 19 mm bioprosthetic AVR Calcified leaflets gradients have increased since prior TTE no AR/PVL . The aortic valve has been repaired/replaced. Aortic valve regurgitation is not visualized. No aortic stenosis is present.  7. The inferior vena cava is normal in size with greater than 50% respiratory variability, suggesting right atrial pressure of 3 mmHg. FINDINGS  Left Ventricle: Left ventricular ejection fraction, by estimation, is 60 to 65%. The left ventricle has normal function. The left ventricle has no regional wall motion abnormalities. Global longitudinal strain performed but not reported based on interpreter judgement due to suboptimal tracking. The left ventricular internal cavity size was normal in size. There is no left ventricular hypertrophy. Left ventricular diastolic parameters are indeterminate. Right Ventricle: The right ventricular size is normal. No increase in right ventricular wall thickness. Right ventricular systolic function is normal. There is severely elevated pulmonary artery systolic pressure. The tricuspid regurgitant velocity is 3.73 m/s, and with an assumed right atrial pressure of 15 mmHg, the estimated right ventricular systolic pressure is 70.7 mmHg. Left Atrium: Left atrial size was moderately dilated. Right  Atrium: Right atrial size was normal in size. Pericardium: There is no evidence of pericardial effusion. Mitral Valve: Severe MAC mean gradient 13 peak 29 mmHg at HR of 92 bpm. The mitral valve is abnormal. There is severe thickening of the mitral valve leaflet(s). There is severe calcification of the mitral valve leaflet(s). Severe mitral annular calcification. Mild to moderate mitral valve regurgitation. Moderate mitral valve stenosis. MV peak gradient, 29.2 mmHg. The mean mitral valve gradient is 13.0 mmHg. Tricuspid Valve: Cannot tell if PPM wire contributes to TR Device leads in RA/RV. The tricuspid valve is abnormal. Tricuspid valve regurgitation is severe. No evidence  of tricuspid stenosis. Aortic Valve: 19 mm bioprosthetic AVR Calcified leaflets gradients have increased since prior TTE no AR/PVL. The aortic valve has been repaired/replaced. Aortic valve regurgitation is not visualized. No aortic stenosis is present. Aortic valve mean gradient measures 13.0 mmHg. Aortic valve peak gradient measures 23.6 mmHg. Aortic valve area, by VTI measures 0.84 cm. Pulmonic Valve: The pulmonic valve was normal in structure. Pulmonic valve regurgitation is not visualized. No evidence of pulmonic stenosis. Aorta: The aortic root is normal in size and structure. Venous: The inferior vena cava is normal in size with greater than 50% respiratory variability, suggesting right atrial pressure of 3 mmHg. IAS/Shunts: No atrial level shunt detected by color flow Doppler.  LEFT VENTRICLE PLAX 2D LVIDd:         3.00 cm     Diastology LVIDs:         2.00 cm     LV e' medial:    3.50 cm/s LV PW:         0.90 cm     LV E/e' medial:  38.6 LV IVS:        1.00 cm     LV e' lateral:   11.00 cm/s LVOT diam:     1.70 cm     LV E/e' lateral: 12.3 LV SV:         35 LV SV Index:   23 LVOT Area:     2.27 cm  LV Volumes (MOD) LV vol d, MOD A2C: 36.5 ml LV vol d, MOD A4C: 15.1 ml LV vol s, MOD A2C: 4.4 ml LV vol s, MOD A4C: 6.4 ml LV SV MOD A2C:     32.1 ml LV SV MOD A4C:     15.1 ml LV SV MOD BP:      19.2 ml RIGHT VENTRICLE RV Basal diam:  3.40 cm RV S prime:     4.73 cm/s LEFT ATRIUM             Index        RIGHT ATRIUM           Index LA diam:        4.10 cm 2.70 cm/m   RA Area:     11.90 cm LA Vol (A2C):   81.0 ml 53.33 ml/m  RA Volume:   26.70 ml  17.58 ml/m LA Vol (A4C):   66.8 ml 43.98 ml/m LA Biplane Vol: 77.1 ml 50.76 ml/m  AORTIC VALVE                     PULMONIC VALVE AV Area (Vmax):    0.81 cm      PV Vmax:          0.84 m/s AV Area (Vmean):   0.84 cm      PV Peak grad:     2.8  mmHg AV Area (VTI):     0.84 cm      PR End Diast Vel: 8.18 msec AV Vmax:           243.00 cm/s AV Vmean:           169.000 cm/s AV VTI:            0.415 m AV Peak Grad:      23.6 mmHg AV Mean Grad:      13.0 mmHg LVOT Vmax:         86.30 cm/s LVOT Vmean:        62.700 cm/s LVOT VTI:          0.154 m LVOT/AV VTI ratio: 0.37  AORTA Ao Root diam: 2.50 cm Ao Asc diam:  2.30 cm MITRAL VALVE                TRICUSPID VALVE MV Area VTI:  0.97 cm      TR Peak grad:   55.7 mmHg MV Peak grad: 29.2 mmHg     TR Vmax:        373.00 cm/s MV Mean grad: 13.0 mmHg MV Vmax:      2.70 m/s      SHUNTS MV Vmean:     161.0 cm/s    Systemic VTI:  0.15 m MV E velocity: 135.00 cm/s  Systemic Diam: 1.70 cm MV A velocity: 233.00 cm/s MV E/A ratio:  0.58 Charlton Haws MD Electronically signed by Charlton Haws MD Signature Date/Time: 07/12/2023/9:44:23 AM    Final     Microbiology: Results for orders placed or performed during the hospital encounter of 08/04/23  Blood Culture (routine x 2)     Status: None   Collection Time: 08/04/23  4:00 PM   Specimen: Left Antecubital; Blood  Result Value Ref Range Status   Specimen Description   Final    LEFT ANTECUBITAL BOTTLES DRAWN AEROBIC AND ANAEROBIC   Special Requests Blood Culture adequate volume  Final   Culture   Final    NO GROWTH 5 DAYS Performed at [facility], [address] [place], [place] [address]    Report Status 08/09/2023 FINAL  Final  Blood Culture (routine x 2)     Status: None   Collection Time: 08/04/23  4:00 PM   Specimen: BLOOD LEFT ARM  Result Value Ref Range Status   Specimen Description BLOOD LEFT ARM BOTTLES DRAWN AEROBIC AND ANAEROBIC  Final   Special Requests Blood Culture adequate volume  Final   Culture   Final    NO GROWTH 5 DAYS Performed at [facility], [address] [place], [place] [address]    Report Status 08/09/2023 FINAL  Final  MRSA Next Gen by PCR, Nasal     Status: None   Collection Time: 08/05/23 12:19 AM   Specimen: Nasal Mucosa; Nasal Swab  Result Value Ref Range Status   MRSA by PCR Next Gen NOT DETECTED NOT DETECTED Final    Comment:  (NOTE) The GeneXpert MRSA Assay (FDA approved for NASAL specimens only), is one component of a comprehensive MRSA colonization surveillance program. It is not intended to diagnose MRSA infection nor to guide or monitor treatment for MRSA infections. Test performance is not FDA approved in patients less than 76 years old. Performed at [facility], [address] [place], [place] [address]    *Note: Due to a large number of results and/or encounters for the requested time period, some results have not been  displayed. A complete set of results can be found in Results Review.    Labs: CBC: Recent Labs  Lab 08/04/23 1600 08/05/23 0323 08/06/23 1828 08/07/23 0440 08/07/23 2121 08/08/23 0547 08/09/23 0513  WBC 10.6* 12.3* 12.2* 11.3*  --   --  11.2*  NEUTROABS 8.4*  --   --   --   --   --   --   HGB 9.5* 9.4* 7.1* 7.0* 7.0* 8.6* 8.4*  HCT 28.0* 25.8* 21.4* 21.8* 21.1* 25.9* 25.1*  MCV 91.2 92.5 92.6 95.2  --   --  91.6  PLT 186 209 201 215  --   --  183   Basic Metabolic Panel: Recent Labs  Lab 08/05/23 0323 08/06/23 0345 08/07/23 0440 08/07/23 2121 08/09/23 0513  NA 130* 128* 129* 129* 124*  K 4.0 3.8 3.7 3.3* 4.0  CL 95* 91* 92* 94* 92*  CO2 21* 19* 24 26 22   GLUCOSE 193* 350* 190* 183* 187*  BUN 91* 33* 57* 28* 47*  CREATININE 2.79* 1.38* 2.30* 1.50* 2.33*  CALCIUM 9.4 9.1 9.8 9.2 9.4  MG  --   --   --  1.6* 2.3  PHOS  --  2.6 3.5 2.5 2.8   Liver Function Tests: Recent Labs  Lab 08/04/23 1600 08/05/23 0323 08/06/23 0345 08/07/23 0440 08/09/23 0513  AST 24 21  --  23  --   ALT 16 14  --  11  --   ALKPHOS 241* 197*  --  155*  --   BILITOT 2.0* 2.2*  --  2.4*  --   PROT 7.0 6.5  --  7.0  --   ALBUMIN 3.0* 2.8* 3.6 3.4*  3.3* 3.0*   CBG: Recent Labs  Lab 08/08/23 1530 08/08/23 2151 08/09/23 0145 08/09/23 0817 08/09/23 1107  GLUCAP 216* 219* 224* 162* 201*    Discharge time spent: greater than 30 minutes.  Signed: Vassie Loll, MD [facility] 08/09/2023

## 2023-08-09 NOTE — Consult Note (Signed)
[facility] Liaison Note  08/09/2023  [name] [birth year] [id number]  Location: RN Hospital Liaison screened the patient remotely at [facility].  Insurance: [employer] HMO   [name] [name] is a 73 y.o. female who is a Primary Care Patient of Tracie Harrier, Fleet Contras, [facility]. The patient was screened for 7 and 30 day readmission hospitalization with noted extreme risk score for unplanned readmission risk with 2 IP/1 ED in 6 months.  The patient was assessed for potential Care Management service needs for post hospital transition for care coordination. Review of patient's electronic medical record reveals patient was admitted for Lethargy. Pt discharged home with [facility] services. Pt is an [facility] patient and provider will follow up with post hospital transition of care needs with their care management services. Liaison will collaborate with the [facility] team concerning pt's recent discharge disposition.   [facility] Care Management/Population Health does not replace or interfere with any arrangements made by the Inpatient Transition of Care team.   For questions contact:   Elliot Cousin, RN, [facility] Liaison [place]   [facility], Population Health Office Hours MTWF  8:00 am-6:00 pm Direct Dial: [phone] mobile [phone] [Office toll free line] Office Hours are M-F 8:30 - 5 pm [email or web address]

## 2023-08-09 NOTE — Plan of Care (Signed)
  Problem: Education: Goal: Ability to describe self-care measures that may prevent or decrease complications (Diabetes Survival Skills Education) will improve Outcome: Progressing Goal: Individualized Educational Video(s) Outcome: Progressing   Problem: Education: Goal: Individualized Educational Video(s) Outcome: Progressing   

## 2023-08-09 NOTE — Progress Notes (Signed)
Patient running in afib with RVR. Given PO metoprolol . Will recheck her BP is 101/79 . She denies SOB or chest pain . Dr. Gwenlyn Perking and Harriett Sine . RN charge aware will recheck VS in 2 hours per protocol  08/09/23 0932  Vitals  Temp 98.8 F (37.1 C)  Temp Source Oral  BP 101/79  MAP (mmHg) 87  BP Location Left Leg  BP Method Automatic  Pulse Rate (!) 144  Pulse Rate Source Monitor  Level of Consciousness  Level of Consciousness Alert  MEWS COLOR  MEWS Score Color Yellow  Oxygen Therapy  SpO2 100 %  O2 Device Room Air  MEWS Score  MEWS Temp 0  MEWS Systolic 0  MEWS Pulse 3  MEWS RR 0  MEWS LOC 0  MEWS Score 3  Provider Notification  Provider Name/Title Gwenlyn Perking  Date Provider Notified 08/09/23  Time Provider Notified [phone]  Method of Notification Page  Notification Reason Change in status

## 2023-08-09 NOTE — Progress Notes (Signed)
Pt had HR 130 at last 10 mins of HD.  08/09/23 1530  Vitals  Temp 98.8 F (37.1 C)  Temp Source Oral  BP 95/65  BP Location Right Leg  BP Method Automatic  Pulse Rate 98  ECG Heart Rate 98  Resp 20  Oxygen Therapy  SpO2 100 %  O2 Device Room Air  During Treatment Monitoring  Intra-Hemodialysis Comments Tx completed  Post Treatment  Dialyzer Clearance Lightly streaked  Hemodialysis Intake (mL) 0 mL  Liters Processed 60  Fluid Removed (mL) 0 mL  Tolerated HD Treatment Yes  Post-Hemodialysis Comments Pt had HR 130 at last 5 mins.  Hemodialysis Catheter Right Internal jugular Double lumen Permanent (Tunneled)  Placement Date/Time: 05/01/23 0948   Placed prior to admission: No  Serial / Lot #: [id number]  Expiration Date: 05/23/27  Time Out: Correct patient;Correct site;Correct procedure  Maximum sterile barrier precautions: Hand hygiene;Cap;Mask;Sterile gown...  Site Condition No complications  Blue Lumen Status Heparin locked  Red Lumen Status Heparin locked  Catheter fill solution Heparin 1000 units/ml  Catheter fill volume (Arterial) 1.9 cc  Catheter fill volume (Venous) 1.9  Dressing Type Transparent  Dressing Status Antimicrobial disc in place  Interventions New dressing  Drainage Description None  Dressing Change Due 08/12/23  Post treatment catheter status Capped and Clamped     08/09/23 1530  Vitals  Temp 98.8 F (37.1 C)  Temp Source Oral  BP 95/65  BP Location Right Leg  BP Method Automatic  Pulse Rate 98  ECG Heart Rate 98  Resp 20  Oxygen Therapy  SpO2 100 %  O2 Device Room Air  During Treatment Monitoring  Intra-Hemodialysis Comments Tx completed  Post Treatment  Dialyzer Clearance Lightly streaked  Hemodialysis Intake (mL) 0 mL  Liters Processed 60  Fluid Removed (mL) 0 mL  Tolerated HD Treatment Yes  Post-Hemodialysis Comments Pt had HR 130 at last 5 mins.  Hemodialysis Catheter Right Internal jugular Double lumen Permanent (Tunneled)   Placement Date/Time: 05/01/23 0948   Placed prior to admission: No  Serial / Lot #: [id number]  Expiration Date: 05/23/27  Time Out: Correct patient;Correct site;Correct procedure  Maximum sterile barrier precautions: Hand hygiene;Cap;Mask;Sterile gown...  Site Condition No complications  Blue Lumen Status Heparin locked  Red Lumen Status Heparin locked  Catheter fill solution Heparin 1000 units/ml  Catheter fill volume (Arterial) 1.9 cc  Catheter fill volume (Venous) 1.9  Dressing Type Transparent  Dressing Status Antimicrobial disc in place  Interventions New dressing  Drainage Description None  Dressing Change Due 08/12/23  Post treatment catheter status Capped and Clamped

## 2023-08-09 NOTE — Inpatient Diabetes Management (Signed)
Inpatient Diabetes Program Recommendations  AACE/ADA: New Consensus Statement on Inpatient Glycemic Control   Target Ranges:  Prepandial:   less than 140 mg/dL      Peak postprandial:   less than 180 mg/dL (1-2 hours)      Critically ill patients:  140 - 180 mg/dL    Latest Reference Range & Units 08/08/23 07:41 08/08/23 11:22 08/08/23 15:30 08/08/23 21:51 08/09/23 01:45  Glucose-Capillary 70 - 99 mg/dL 829 (H) 562 (H) 130 (H) 219 (H) 224 (H)   Review of Glycemic Control   Diabetes history: DM1 (does NOT make any insulin; requires basal, correction, and carb coverage insulin) Outpatient Diabetes medications: Medtronic Insulin Pump with Apidra, Guardian CGM  Current orders for Inpatient glycemic control: Semglee 3 units daily, Apidra 0-9 units TID with meals, Apidra 0-5 units at bedtime, Apidra 2 units TID with meals  Inpatient Diabetes Program Recommendations:    Insulin: Please consider increasing Semglee to 4 units daily and meal coverage to Apidra 3 units TID with meals.  Thanks, Orlando Penner, RN, MSN, CDCES Diabetes Coordinator Inpatient Diabetes Program [phone] (Team Pager from 8am to 5pm)

## 2023-08-10 DIAGNOSIS — N186 End stage renal disease: Secondary | ICD-10-CM | POA: Diagnosis not present

## 2023-08-10 DIAGNOSIS — E1022 Type 1 diabetes mellitus with diabetic chronic kidney disease: Secondary | ICD-10-CM | POA: Diagnosis not present

## 2023-08-10 DIAGNOSIS — I2510 Atherosclerotic heart disease of native coronary artery without angina pectoris: Secondary | ICD-10-CM | POA: Diagnosis not present

## 2023-08-10 DIAGNOSIS — I5032 Chronic diastolic (congestive) heart failure: Secondary | ICD-10-CM | POA: Diagnosis not present

## 2023-08-10 DIAGNOSIS — I132 Hypertensive heart and chronic kidney disease with heart failure and with stage 5 chronic kidney disease, or end stage renal disease: Secondary | ICD-10-CM | POA: Diagnosis not present

## 2023-08-10 DIAGNOSIS — I495 Sick sinus syndrome: Secondary | ICD-10-CM | POA: Diagnosis not present

## 2023-08-10 DIAGNOSIS — D631 Anemia in chronic kidney disease: Secondary | ICD-10-CM | POA: Diagnosis not present

## 2023-08-10 DIAGNOSIS — J1001 Influenza due to other identified influenza virus with the same other identified influenza virus pneumonia: Secondary | ICD-10-CM | POA: Diagnosis not present

## 2023-08-10 DIAGNOSIS — E871 Hypo-osmolality and hyponatremia: Secondary | ICD-10-CM | POA: Diagnosis not present

## 2023-08-12 ENCOUNTER — Encounter (HOSPITAL_COMMUNITY): Payer: Self-pay | Admitting: Cardiology

## 2023-08-12 DIAGNOSIS — N186 End stage renal disease: Secondary | ICD-10-CM | POA: Diagnosis not present

## 2023-08-12 DIAGNOSIS — Z992 Dependence on renal dialysis: Secondary | ICD-10-CM | POA: Diagnosis not present

## 2023-08-12 DIAGNOSIS — N2581 Secondary hyperparathyroidism of renal origin: Secondary | ICD-10-CM | POA: Diagnosis not present

## 2023-08-12 NOTE — Progress Notes (Signed)
Remote pacemaker transmission.   

## 2023-08-12 NOTE — Addendum Note (Signed)
Addended by: Elease Etienne A on: 08/12/2023 12:37 PM   Modules accepted: Orders

## 2023-08-13 DIAGNOSIS — N2581 Secondary hyperparathyroidism of renal origin: Secondary | ICD-10-CM | POA: Diagnosis not present

## 2023-08-13 DIAGNOSIS — Z992 Dependence on renal dialysis: Secondary | ICD-10-CM | POA: Diagnosis not present

## 2023-08-13 DIAGNOSIS — N186 End stage renal disease: Secondary | ICD-10-CM | POA: Diagnosis not present

## 2023-08-14 DIAGNOSIS — D631 Anemia in chronic kidney disease: Secondary | ICD-10-CM | POA: Diagnosis not present

## 2023-08-14 DIAGNOSIS — Z9289 Personal history of other medical treatment: Secondary | ICD-10-CM | POA: Diagnosis not present

## 2023-08-14 DIAGNOSIS — I2510 Atherosclerotic heart disease of native coronary artery without angina pectoris: Secondary | ICD-10-CM | POA: Diagnosis not present

## 2023-08-14 DIAGNOSIS — K7460 Unspecified cirrhosis of liver: Secondary | ICD-10-CM | POA: Diagnosis not present

## 2023-08-14 DIAGNOSIS — F419 Anxiety disorder, unspecified: Secondary | ICD-10-CM | POA: Diagnosis not present

## 2023-08-14 DIAGNOSIS — I5084 End stage heart failure: Secondary | ICD-10-CM | POA: Diagnosis not present

## 2023-08-14 DIAGNOSIS — I495 Sick sinus syndrome: Secondary | ICD-10-CM | POA: Diagnosis not present

## 2023-08-14 DIAGNOSIS — N186 End stage renal disease: Secondary | ICD-10-CM | POA: Diagnosis not present

## 2023-08-14 DIAGNOSIS — J111 Influenza due to unidentified influenza virus with other respiratory manifestations: Secondary | ICD-10-CM | POA: Diagnosis not present

## 2023-08-14 DIAGNOSIS — Z992 Dependence on renal dialysis: Secondary | ICD-10-CM | POA: Diagnosis not present

## 2023-08-14 DIAGNOSIS — J1001 Influenza due to other identified influenza virus with the same other identified influenza virus pneumonia: Secondary | ICD-10-CM | POA: Diagnosis not present

## 2023-08-14 DIAGNOSIS — I5030 Unspecified diastolic (congestive) heart failure: Secondary | ICD-10-CM | POA: Diagnosis not present

## 2023-08-14 DIAGNOSIS — I132 Hypertensive heart and chronic kidney disease with heart failure and with stage 5 chronic kidney disease, or end stage renal disease: Secondary | ICD-10-CM | POA: Diagnosis not present

## 2023-08-14 DIAGNOSIS — E871 Hypo-osmolality and hyponatremia: Secondary | ICD-10-CM | POA: Diagnosis not present

## 2023-08-14 DIAGNOSIS — I5032 Chronic diastolic (congestive) heart failure: Secondary | ICD-10-CM | POA: Diagnosis not present

## 2023-08-14 DIAGNOSIS — E1022 Type 1 diabetes mellitus with diabetic chronic kidney disease: Secondary | ICD-10-CM | POA: Diagnosis not present

## 2023-08-15 DIAGNOSIS — N186 End stage renal disease: Secondary | ICD-10-CM | POA: Diagnosis not present

## 2023-08-15 DIAGNOSIS — Z992 Dependence on renal dialysis: Secondary | ICD-10-CM | POA: Diagnosis not present

## 2023-08-15 DIAGNOSIS — N2581 Secondary hyperparathyroidism of renal origin: Secondary | ICD-10-CM | POA: Diagnosis not present

## 2023-08-16 DIAGNOSIS — Z992 Dependence on renal dialysis: Secondary | ICD-10-CM | POA: Diagnosis not present

## 2023-08-16 DIAGNOSIS — J1001 Influenza due to other identified influenza virus with the same other identified influenza virus pneumonia: Secondary | ICD-10-CM | POA: Diagnosis not present

## 2023-08-16 DIAGNOSIS — E871 Hypo-osmolality and hyponatremia: Secondary | ICD-10-CM | POA: Diagnosis not present

## 2023-08-16 DIAGNOSIS — I2510 Atherosclerotic heart disease of native coronary artery without angina pectoris: Secondary | ICD-10-CM | POA: Diagnosis not present

## 2023-08-16 DIAGNOSIS — N2581 Secondary hyperparathyroidism of renal origin: Secondary | ICD-10-CM | POA: Diagnosis not present

## 2023-08-16 DIAGNOSIS — I132 Hypertensive heart and chronic kidney disease with heart failure and with stage 5 chronic kidney disease, or end stage renal disease: Secondary | ICD-10-CM | POA: Diagnosis not present

## 2023-08-16 DIAGNOSIS — E1022 Type 1 diabetes mellitus with diabetic chronic kidney disease: Secondary | ICD-10-CM | POA: Diagnosis not present

## 2023-08-16 DIAGNOSIS — I5032 Chronic diastolic (congestive) heart failure: Secondary | ICD-10-CM | POA: Diagnosis not present

## 2023-08-16 DIAGNOSIS — D631 Anemia in chronic kidney disease: Secondary | ICD-10-CM | POA: Diagnosis not present

## 2023-08-16 DIAGNOSIS — I495 Sick sinus syndrome: Secondary | ICD-10-CM | POA: Diagnosis not present

## 2023-08-16 DIAGNOSIS — N186 End stage renal disease: Secondary | ICD-10-CM | POA: Diagnosis not present

## 2023-08-19 DIAGNOSIS — Z992 Dependence on renal dialysis: Secondary | ICD-10-CM | POA: Diagnosis not present

## 2023-08-19 DIAGNOSIS — N2581 Secondary hyperparathyroidism of renal origin: Secondary | ICD-10-CM | POA: Diagnosis not present

## 2023-08-19 DIAGNOSIS — N186 End stage renal disease: Secondary | ICD-10-CM | POA: Diagnosis not present

## 2023-08-20 ENCOUNTER — Telehealth: Payer: Self-pay

## 2023-08-20 DIAGNOSIS — N2581 Secondary hyperparathyroidism of renal origin: Secondary | ICD-10-CM | POA: Diagnosis not present

## 2023-08-20 DIAGNOSIS — N186 End stage renal disease: Secondary | ICD-10-CM | POA: Diagnosis not present

## 2023-08-20 DIAGNOSIS — Z992 Dependence on renal dialysis: Secondary | ICD-10-CM | POA: Diagnosis not present

## 2023-08-20 NOTE — Telephone Encounter (Signed)
Called pt to schedule her surgery. Her husband stated she was in dialysis and that she was not ready at this time to schedule. They would like to call us once pt is ready.

## 2023-08-21 DIAGNOSIS — D631 Anemia in chronic kidney disease: Secondary | ICD-10-CM | POA: Diagnosis not present

## 2023-08-21 DIAGNOSIS — I5032 Chronic diastolic (congestive) heart failure: Secondary | ICD-10-CM | POA: Diagnosis not present

## 2023-08-21 DIAGNOSIS — I132 Hypertensive heart and chronic kidney disease with heart failure and with stage 5 chronic kidney disease, or end stage renal disease: Secondary | ICD-10-CM | POA: Diagnosis not present

## 2023-08-21 DIAGNOSIS — E1022 Type 1 diabetes mellitus with diabetic chronic kidney disease: Secondary | ICD-10-CM | POA: Diagnosis not present

## 2023-08-21 DIAGNOSIS — E871 Hypo-osmolality and hyponatremia: Secondary | ICD-10-CM | POA: Diagnosis not present

## 2023-08-21 DIAGNOSIS — I509 Heart failure, unspecified: Secondary | ICD-10-CM | POA: Diagnosis not present

## 2023-08-21 DIAGNOSIS — J1001 Influenza due to other identified influenza virus with the same other identified influenza virus pneumonia: Secondary | ICD-10-CM | POA: Diagnosis not present

## 2023-08-21 DIAGNOSIS — Z992 Dependence on renal dialysis: Secondary | ICD-10-CM | POA: Diagnosis not present

## 2023-08-21 DIAGNOSIS — Z9641 Presence of insulin pump (external) (internal): Secondary | ICD-10-CM | POA: Diagnosis not present

## 2023-08-21 DIAGNOSIS — I495 Sick sinus syndrome: Secondary | ICD-10-CM | POA: Diagnosis not present

## 2023-08-21 DIAGNOSIS — N186 End stage renal disease: Secondary | ICD-10-CM | POA: Diagnosis not present

## 2023-08-21 DIAGNOSIS — I2510 Atherosclerotic heart disease of native coronary artery without angina pectoris: Secondary | ICD-10-CM | POA: Diagnosis not present

## 2023-08-22 DIAGNOSIS — N186 End stage renal disease: Secondary | ICD-10-CM | POA: Diagnosis not present

## 2023-08-22 DIAGNOSIS — Z992 Dependence on renal dialysis: Secondary | ICD-10-CM | POA: Diagnosis not present

## 2023-08-22 DIAGNOSIS — N2581 Secondary hyperparathyroidism of renal origin: Secondary | ICD-10-CM | POA: Diagnosis not present

## 2023-08-23 DIAGNOSIS — I509 Heart failure, unspecified: Secondary | ICD-10-CM | POA: Diagnosis not present

## 2023-08-23 DIAGNOSIS — Z992 Dependence on renal dialysis: Secondary | ICD-10-CM | POA: Diagnosis not present

## 2023-08-23 DIAGNOSIS — N186 End stage renal disease: Secondary | ICD-10-CM | POA: Diagnosis not present

## 2023-08-24 DEATH — deceased

## 2023-09-12 ENCOUNTER — Encounter (HOSPITAL_COMMUNITY): Payer: Medicare PPO | Admitting: Cardiology

## 2024-04-15 ENCOUNTER — Other Ambulatory Visit (HOSPITAL_COMMUNITY): Payer: Self-pay

## 2024-06-03 ENCOUNTER — Ambulatory Visit: Payer: Medicare PPO | Admitting: Physician Assistant

## 2024-06-03 ENCOUNTER — Other Ambulatory Visit: Payer: Medicare PPO
